# Patient Record
Sex: Female | Born: 1945
Health system: Southern US, Community
[De-identification: ages and names within clinical notes are randomized; demographics above are authoritative.]

## PROBLEM LIST (undated history)

## (undated) ENCOUNTER — Emergency Department (HOSPITAL_BASED_OUTPATIENT_CLINIC_OR_DEPARTMENT_OTHER): Admission: EM | Payer: Medicare Other

## (undated) DIAGNOSIS — I998 Other disorder of circulatory system: Secondary | ICD-10-CM

## (undated) DIAGNOSIS — I447 Left bundle-branch block, unspecified: Secondary | ICD-10-CM

## (undated) DIAGNOSIS — I428 Other cardiomyopathies: Secondary | ICD-10-CM

## (undated) DIAGNOSIS — Z8619 Personal history of other infectious and parasitic diseases: Secondary | ICD-10-CM

## (undated) DIAGNOSIS — I10 Essential (primary) hypertension: Secondary | ICD-10-CM

## (undated) DIAGNOSIS — K529 Noninfective gastroenteritis and colitis, unspecified: Secondary | ICD-10-CM

## (undated) DIAGNOSIS — G4733 Obstructive sleep apnea (adult) (pediatric): Secondary | ICD-10-CM

## (undated) DIAGNOSIS — K922 Gastrointestinal hemorrhage, unspecified: Secondary | ICD-10-CM

## (undated) DIAGNOSIS — H409 Unspecified glaucoma: Secondary | ICD-10-CM

## (undated) DIAGNOSIS — D259 Leiomyoma of uterus, unspecified: Secondary | ICD-10-CM

## (undated) DIAGNOSIS — M7989 Other specified soft tissue disorders: Secondary | ICD-10-CM

## (undated) DIAGNOSIS — E669 Obesity, unspecified: Secondary | ICD-10-CM

## (undated) DIAGNOSIS — T829XXA Unspecified complication of cardiac and vascular prosthetic device, implant and graft, initial encounter: Secondary | ICD-10-CM

## (undated) DIAGNOSIS — K5790 Diverticulosis of intestine, part unspecified, without perforation or abscess without bleeding: Secondary | ICD-10-CM

## (undated) DIAGNOSIS — K7469 Other cirrhosis of liver: Secondary | ICD-10-CM

## (undated) DIAGNOSIS — J810 Acute pulmonary edema: Secondary | ICD-10-CM

## (undated) DIAGNOSIS — I4891 Unspecified atrial fibrillation: Secondary | ICD-10-CM

## (undated) DIAGNOSIS — Z9289 Personal history of other medical treatment: Secondary | ICD-10-CM

## (undated) DIAGNOSIS — J449 Chronic obstructive pulmonary disease, unspecified: Secondary | ICD-10-CM

## (undated) DIAGNOSIS — E119 Type 2 diabetes mellitus without complications: Secondary | ICD-10-CM

## (undated) DIAGNOSIS — E039 Hypothyroidism, unspecified: Secondary | ICD-10-CM

## (undated) DIAGNOSIS — M109 Gout, unspecified: Secondary | ICD-10-CM

## (undated) DIAGNOSIS — K31819 Angiodysplasia of stomach and duodenum without bleeding: Secondary | ICD-10-CM

## (undated) DIAGNOSIS — M1990 Unspecified osteoarthritis, unspecified site: Secondary | ICD-10-CM

## (undated) DIAGNOSIS — N189 Chronic kidney disease, unspecified: Secondary | ICD-10-CM

## (undated) DIAGNOSIS — I5022 Chronic systolic (congestive) heart failure: Secondary | ICD-10-CM

## (undated) DIAGNOSIS — Z95810 Presence of automatic (implantable) cardiac defibrillator: Secondary | ICD-10-CM

## (undated) DIAGNOSIS — J45909 Unspecified asthma, uncomplicated: Secondary | ICD-10-CM

## (undated) DIAGNOSIS — M79602 Pain in left arm: Secondary | ICD-10-CM

## (undated) DIAGNOSIS — F329 Major depressive disorder, single episode, unspecified: Secondary | ICD-10-CM

## (undated) DIAGNOSIS — K648 Other hemorrhoids: Secondary | ICD-10-CM

## (undated) DIAGNOSIS — J42 Unspecified chronic bronchitis: Secondary | ICD-10-CM

## (undated) DIAGNOSIS — J189 Pneumonia, unspecified organism: Secondary | ICD-10-CM

## (undated) DIAGNOSIS — N179 Acute kidney failure, unspecified: Secondary | ICD-10-CM

## (undated) DIAGNOSIS — Z7901 Long term (current) use of anticoagulants: Secondary | ICD-10-CM

## (undated) DIAGNOSIS — M949 Disorder of cartilage, unspecified: Secondary | ICD-10-CM

## (undated) DIAGNOSIS — M899 Disorder of bone, unspecified: Secondary | ICD-10-CM

## (undated) DIAGNOSIS — B1920 Unspecified viral hepatitis C without hepatic coma: Secondary | ICD-10-CM

## (undated) DIAGNOSIS — E785 Hyperlipidemia, unspecified: Secondary | ICD-10-CM

## (undated) DIAGNOSIS — I8500 Esophageal varices without bleeding: Secondary | ICD-10-CM

## (undated) DIAGNOSIS — D649 Anemia, unspecified: Secondary | ICD-10-CM

## (undated) HISTORY — DX: Other hemorrhoids: K64.8

## (undated) HISTORY — DX: Personal history of other infectious and parasitic diseases: Z86.19

## (undated) HISTORY — DX: Leiomyoma of uterus, unspecified: D25.9

## (undated) HISTORY — DX: Unspecified osteoarthritis, unspecified site: M19.90

## (undated) HISTORY — DX: Diverticulosis of intestine, part unspecified, without perforation or abscess without bleeding: K57.90

## (undated) HISTORY — DX: Left bundle-branch block, unspecified: I44.7

## (undated) HISTORY — DX: Chronic systolic (congestive) heart failure: I50.22

## (undated) HISTORY — PX: CARDIAC CATHETERIZATION: SHX172

## (undated) HISTORY — DX: Unspecified asthma, uncomplicated: J45.909

## (undated) HISTORY — DX: Long term (current) use of anticoagulants: Z79.01

## (undated) HISTORY — DX: Other disorder of circulatory system: I99.8

## (undated) HISTORY — DX: Unspecified viral hepatitis C without hepatic coma: B19.20

## (undated) HISTORY — DX: Anemia, unspecified: D64.9

## (undated) HISTORY — DX: Unspecified viral hepatitis C without hepatic coma: K74.69

## (undated) HISTORY — DX: Essential (primary) hypertension: I10

## (undated) HISTORY — PX: TUBAL LIGATION: SHX77

## (undated) HISTORY — DX: Other cardiomyopathies: I42.8

## (undated) HISTORY — DX: Hypothyroidism, unspecified: E03.9

## (undated) HISTORY — DX: Unspecified glaucoma: H40.9

## (undated) HISTORY — PX: DILATION AND CURETTAGE OF UTERUS: SHX78

## (undated) HISTORY — DX: Hyperlipidemia, unspecified: E78.5

## (undated) HISTORY — PX: APPENDECTOMY: SHX54

## (undated) HISTORY — DX: Angiodysplasia of stomach and duodenum without bleeding: K31.819

## (undated) HISTORY — DX: Unspecified complication of cardiac and vascular prosthetic device, implant and graft, initial encounter: T82.9XXA

## (undated) HISTORY — DX: Disorder of bone, unspecified: M89.9

## (undated) HISTORY — DX: Type 2 diabetes mellitus without complications: E11.9

## (undated) HISTORY — DX: Disorder of cartilage, unspecified: M94.9

## (undated) HISTORY — PX: INSERT / REPLACE / REMOVE PACEMAKER: SUR710

## (undated) HISTORY — DX: Obstructive sleep apnea (adult) (pediatric): G47.33

## (undated) HISTORY — DX: Chronic obstructive pulmonary disease, unspecified: J44.9

## (undated) HISTORY — DX: Esophageal varices without bleeding: I85.00

## (undated) HISTORY — DX: Major depressive disorder, single episode, unspecified: F32.9

## (undated) HISTORY — DX: Obesity, unspecified: E66.9

---

## 2004-06-15 ENCOUNTER — Emergency Department (HOSPITAL_COMMUNITY): Admission: EM | Admit: 2004-06-15 | Discharge: 2004-06-15 | Payer: Self-pay | Admitting: Emergency Medicine

## 2004-08-04 ENCOUNTER — Inpatient Hospital Stay (HOSPITAL_COMMUNITY): Admission: EM | Admit: 2004-08-04 | Discharge: 2004-08-08 | Payer: Self-pay | Admitting: Emergency Medicine

## 2004-08-04 ENCOUNTER — Ambulatory Visit: Payer: Self-pay | Admitting: Internal Medicine

## 2004-08-05 ENCOUNTER — Encounter: Payer: Self-pay | Admitting: Cardiology

## 2004-08-09 ENCOUNTER — Ambulatory Visit: Payer: Self-pay

## 2004-08-25 ENCOUNTER — Ambulatory Visit: Payer: Self-pay | Admitting: Cardiology

## 2004-09-02 ENCOUNTER — Inpatient Hospital Stay (HOSPITAL_BASED_OUTPATIENT_CLINIC_OR_DEPARTMENT_OTHER): Admission: RE | Admit: 2004-09-02 | Discharge: 2004-09-02 | Payer: Self-pay | Admitting: Cardiology

## 2004-09-02 ENCOUNTER — Ambulatory Visit: Payer: Self-pay | Admitting: Cardiology

## 2004-09-08 ENCOUNTER — Ambulatory Visit: Payer: Self-pay | Admitting: Cardiology

## 2005-10-20 ENCOUNTER — Emergency Department (HOSPITAL_COMMUNITY): Admission: EM | Admit: 2005-10-20 | Discharge: 2005-10-21 | Payer: Self-pay | Admitting: Emergency Medicine

## 2005-10-21 ENCOUNTER — Emergency Department (HOSPITAL_COMMUNITY): Admission: EM | Admit: 2005-10-21 | Discharge: 2005-10-21 | Payer: Self-pay | Admitting: Emergency Medicine

## 2005-10-26 ENCOUNTER — Emergency Department (HOSPITAL_COMMUNITY): Admission: EM | Admit: 2005-10-26 | Discharge: 2005-10-26 | Payer: Self-pay | Admitting: Family Medicine

## 2005-10-29 ENCOUNTER — Emergency Department (HOSPITAL_COMMUNITY): Admission: EM | Admit: 2005-10-29 | Discharge: 2005-10-29 | Payer: Self-pay | Admitting: Emergency Medicine

## 2005-11-04 ENCOUNTER — Emergency Department (HOSPITAL_COMMUNITY): Admission: EM | Admit: 2005-11-04 | Discharge: 2005-11-04 | Payer: Self-pay | Admitting: Family Medicine

## 2005-11-06 ENCOUNTER — Ambulatory Visit: Payer: Self-pay | Admitting: Family Medicine

## 2005-11-07 ENCOUNTER — Ambulatory Visit: Payer: Self-pay | Admitting: *Deleted

## 2005-11-14 ENCOUNTER — Ambulatory Visit: Payer: Self-pay | Admitting: Family Medicine

## 2005-11-23 ENCOUNTER — Ambulatory Visit: Payer: Self-pay | Admitting: Family Medicine

## 2005-12-08 ENCOUNTER — Ambulatory Visit (HOSPITAL_COMMUNITY): Admission: RE | Admit: 2005-12-08 | Discharge: 2005-12-08 | Payer: Self-pay | Admitting: Family Medicine

## 2005-12-13 ENCOUNTER — Ambulatory Visit: Payer: Self-pay | Admitting: Family Medicine

## 2006-01-30 ENCOUNTER — Ambulatory Visit: Payer: Self-pay | Admitting: Family Medicine

## 2006-03-21 ENCOUNTER — Ambulatory Visit: Payer: Self-pay | Admitting: Family Medicine

## 2006-04-04 ENCOUNTER — Ambulatory Visit: Payer: Self-pay | Admitting: Family Medicine

## 2006-05-21 ENCOUNTER — Ambulatory Visit: Payer: Self-pay | Admitting: Internal Medicine

## 2006-05-28 ENCOUNTER — Ambulatory Visit: Payer: Self-pay | Admitting: Internal Medicine

## 2006-06-06 ENCOUNTER — Ambulatory Visit: Payer: Self-pay | Admitting: Internal Medicine

## 2006-06-10 ENCOUNTER — Ambulatory Visit: Payer: Self-pay | Admitting: Internal Medicine

## 2006-06-10 ENCOUNTER — Inpatient Hospital Stay (HOSPITAL_COMMUNITY): Admission: EM | Admit: 2006-06-10 | Discharge: 2006-06-19 | Payer: Self-pay | Admitting: Emergency Medicine

## 2006-06-12 ENCOUNTER — Encounter (INDEPENDENT_AMBULATORY_CARE_PROVIDER_SITE_OTHER): Payer: Self-pay | Admitting: Cardiology

## 2006-06-25 ENCOUNTER — Ambulatory Visit: Payer: Self-pay | Admitting: Internal Medicine

## 2006-07-11 ENCOUNTER — Ambulatory Visit: Payer: Self-pay | Admitting: Internal Medicine

## 2006-08-03 ENCOUNTER — Ambulatory Visit: Payer: Self-pay | Admitting: Cardiology

## 2006-08-10 ENCOUNTER — Ambulatory Visit: Payer: Self-pay | Admitting: Internal Medicine

## 2006-08-10 LAB — CONVERTED CEMR LAB
CO2: 31 meq/L (ref 19–32)
Calcium: 9.7 mg/dL (ref 8.4–10.5)
Creatinine, Ser: 0.9 mg/dL (ref 0.4–1.2)
GFR calc Af Amer: 83 mL/min
Glucose, Bld: 107 mg/dL — ABNORMAL HIGH (ref 70–99)

## 2006-08-20 ENCOUNTER — Ambulatory Visit: Payer: Self-pay | Admitting: Internal Medicine

## 2006-08-31 ENCOUNTER — Ambulatory Visit: Payer: Self-pay | Admitting: Cardiology

## 2006-08-31 LAB — CONVERTED CEMR LAB
BUN: 17 mg/dL (ref 6–23)
CO2: 33 meq/L — ABNORMAL HIGH (ref 19–32)
Calcium: 9.8 mg/dL (ref 8.4–10.5)
Creatinine, Ser: 1 mg/dL (ref 0.4–1.2)
GFR calc Af Amer: 73 mL/min
Glucose, Bld: 110 mg/dL — ABNORMAL HIGH (ref 70–99)

## 2006-10-08 ENCOUNTER — Ambulatory Visit: Payer: Self-pay | Admitting: Cardiology

## 2006-10-08 LAB — CONVERTED CEMR LAB
BUN: 26 mg/dL — ABNORMAL HIGH (ref 6–23)
CO2: 33 meq/L — ABNORMAL HIGH (ref 19–32)
Calcium: 9.6 mg/dL (ref 8.4–10.5)
Chloride: 106 meq/L (ref 96–112)
Creatinine, Ser: 1.1 mg/dL (ref 0.4–1.2)
GFR calc Af Amer: 66 mL/min
Glucose, Bld: 99 mg/dL (ref 70–99)

## 2006-10-24 ENCOUNTER — Encounter: Payer: Self-pay | Admitting: Cardiology

## 2006-10-24 ENCOUNTER — Ambulatory Visit: Payer: Self-pay

## 2006-10-30 ENCOUNTER — Ambulatory Visit: Payer: Self-pay | Admitting: Cardiology

## 2006-11-05 ENCOUNTER — Ambulatory Visit: Payer: Self-pay | Admitting: Internal Medicine

## 2006-11-14 ENCOUNTER — Ambulatory Visit (HOSPITAL_COMMUNITY): Admission: RE | Admit: 2006-11-14 | Discharge: 2006-11-14 | Payer: Self-pay | Admitting: Cardiology

## 2006-11-14 ENCOUNTER — Ambulatory Visit: Payer: Self-pay | Admitting: Internal Medicine

## 2006-12-04 ENCOUNTER — Ambulatory Visit: Payer: Self-pay | Admitting: Internal Medicine

## 2006-12-04 LAB — CONVERTED CEMR LAB
BUN: 21 mg/dL (ref 6–23)
CO2: 29 meq/L (ref 19–32)
Calcium: 9.8 mg/dL (ref 8.4–10.5)
Chloride: 99 meq/L (ref 96–112)
Creatinine, Ser: 1.1 mg/dL (ref 0.4–1.2)
GFR calc Af Amer: 66 mL/min
Glucose, Bld: 114 mg/dL — ABNORMAL HIGH (ref 70–99)

## 2006-12-20 ENCOUNTER — Ambulatory Visit: Payer: Self-pay

## 2006-12-20 ENCOUNTER — Encounter: Payer: Self-pay | Admitting: Internal Medicine

## 2006-12-20 ENCOUNTER — Ambulatory Visit: Payer: Self-pay | Admitting: Internal Medicine

## 2006-12-20 LAB — CONVERTED CEMR LAB
BUN: 25 mg/dL — ABNORMAL HIGH (ref 6–23)
Basophils Relative: 0.4 % (ref 0.0–1.0)
CO2: 30 meq/L (ref 19–32)
Creatinine, Ser: 0.9 mg/dL (ref 0.4–1.2)
Eosinophils Relative: 3.3 % (ref 0.0–5.0)
Glucose, Bld: 124 mg/dL — ABNORMAL HIGH (ref 70–99)
HCT: 42.5 % (ref 36.0–46.0)
Hemoglobin: 14.1 g/dL (ref 12.0–15.0)
INR: 1.1 (ref 0.9–2.0)
Lymphocytes Relative: 38 % (ref 12.0–46.0)
Monocytes Absolute: 0.6 10*3/uL (ref 0.2–0.7)
Monocytes Relative: 11.2 % — ABNORMAL HIGH (ref 3.0–11.0)
Neutro Abs: 2.5 10*3/uL (ref 1.4–7.7)
Potassium: 3.9 meq/L (ref 3.5–5.1)
Prothrombin Time: 13 s (ref 10.0–14.0)
RDW: 13.5 % (ref 11.5–14.6)
WBC: 5.3 10*3/uL (ref 4.5–10.5)

## 2006-12-24 ENCOUNTER — Ambulatory Visit: Payer: Self-pay | Admitting: Internal Medicine

## 2006-12-24 ENCOUNTER — Inpatient Hospital Stay (HOSPITAL_COMMUNITY): Admission: RE | Admit: 2006-12-24 | Discharge: 2006-12-26 | Payer: Self-pay | Admitting: Internal Medicine

## 2007-01-01 ENCOUNTER — Ambulatory Visit: Payer: Self-pay | Admitting: Internal Medicine

## 2007-01-04 DIAGNOSIS — B1920 Unspecified viral hepatitis C without hepatic coma: Secondary | ICD-10-CM | POA: Insufficient documentation

## 2007-01-04 HISTORY — DX: Unspecified viral hepatitis C without hepatic coma: B19.20

## 2007-01-05 ENCOUNTER — Inpatient Hospital Stay (HOSPITAL_COMMUNITY): Admission: EM | Admit: 2007-01-05 | Discharge: 2007-01-08 | Payer: Self-pay | Admitting: Emergency Medicine

## 2007-01-05 ENCOUNTER — Ambulatory Visit: Payer: Self-pay | Admitting: Internal Medicine

## 2007-01-08 DIAGNOSIS — Z95810 Presence of automatic (implantable) cardiac defibrillator: Secondary | ICD-10-CM

## 2007-01-08 HISTORY — DX: Presence of automatic (implantable) cardiac defibrillator: Z95.810

## 2007-01-10 ENCOUNTER — Ambulatory Visit: Payer: Self-pay | Admitting: Internal Medicine

## 2007-01-10 ENCOUNTER — Ambulatory Visit: Payer: Self-pay

## 2007-01-10 LAB — CONVERTED CEMR LAB
BUN: 25 mg/dL — ABNORMAL HIGH (ref 6–23)
CO2: 28 meq/L (ref 19–32)
Calcium: 9.6 mg/dL (ref 8.4–10.5)
Creatinine, Ser: 1.2 mg/dL (ref 0.4–1.2)
Glucose, Bld: 117 mg/dL — ABNORMAL HIGH (ref 70–99)
Potassium: 3.8 meq/L (ref 3.5–5.1)

## 2007-01-23 ENCOUNTER — Encounter (INDEPENDENT_AMBULATORY_CARE_PROVIDER_SITE_OTHER): Payer: Self-pay | Admitting: *Deleted

## 2007-01-24 ENCOUNTER — Ambulatory Visit: Payer: Self-pay | Admitting: Cardiology

## 2007-01-28 ENCOUNTER — Ambulatory Visit: Payer: Self-pay | Admitting: Internal Medicine

## 2007-01-28 DIAGNOSIS — E039 Hypothyroidism, unspecified: Secondary | ICD-10-CM | POA: Insufficient documentation

## 2007-02-13 ENCOUNTER — Ambulatory Visit: Payer: Self-pay | Admitting: Internal Medicine

## 2007-03-06 ENCOUNTER — Encounter (INDEPENDENT_AMBULATORY_CARE_PROVIDER_SITE_OTHER): Payer: Self-pay | Admitting: Nurse Practitioner

## 2007-03-06 ENCOUNTER — Ambulatory Visit: Payer: Self-pay | Admitting: Internal Medicine

## 2007-03-14 ENCOUNTER — Telehealth (INDEPENDENT_AMBULATORY_CARE_PROVIDER_SITE_OTHER): Payer: Self-pay | Admitting: *Deleted

## 2007-03-15 ENCOUNTER — Ambulatory Visit: Payer: Self-pay | Admitting: Nurse Practitioner

## 2007-04-02 ENCOUNTER — Ambulatory Visit: Payer: Self-pay | Admitting: Internal Medicine

## 2007-04-24 ENCOUNTER — Encounter (INDEPENDENT_AMBULATORY_CARE_PROVIDER_SITE_OTHER): Payer: Self-pay | Admitting: Nurse Practitioner

## 2007-04-24 DIAGNOSIS — I5032 Chronic diastolic (congestive) heart failure: Secondary | ICD-10-CM | POA: Insufficient documentation

## 2007-04-24 DIAGNOSIS — I10 Essential (primary) hypertension: Secondary | ICD-10-CM | POA: Insufficient documentation

## 2007-04-24 DIAGNOSIS — I447 Left bundle-branch block, unspecified: Secondary | ICD-10-CM | POA: Insufficient documentation

## 2007-04-24 DIAGNOSIS — J449 Chronic obstructive pulmonary disease, unspecified: Secondary | ICD-10-CM | POA: Insufficient documentation

## 2007-04-24 HISTORY — DX: Chronic diastolic (congestive) heart failure: I50.32

## 2007-04-24 HISTORY — DX: Essential (primary) hypertension: I10

## 2007-04-29 ENCOUNTER — Emergency Department (HOSPITAL_COMMUNITY): Admission: EM | Admit: 2007-04-29 | Discharge: 2007-04-29 | Payer: Self-pay | Admitting: Emergency Medicine

## 2007-05-13 ENCOUNTER — Ambulatory Visit: Payer: Self-pay | Admitting: Cardiology

## 2007-05-13 LAB — CONVERTED CEMR LAB
BUN: 13 mg/dL (ref 6–23)
CO2: 28 meq/L (ref 19–32)
Calcium: 9.9 mg/dL (ref 8.4–10.5)
Creatinine, Ser: 0.9 mg/dL (ref 0.4–1.2)
Glucose, Bld: 104 mg/dL — ABNORMAL HIGH (ref 70–99)
TSH: 2.72 microintl units/mL (ref 0.35–5.50)

## 2007-05-14 ENCOUNTER — Ambulatory Visit: Payer: Self-pay | Admitting: Internal Medicine

## 2007-06-12 ENCOUNTER — Ambulatory Visit: Payer: Self-pay | Admitting: Internal Medicine

## 2007-08-05 ENCOUNTER — Ambulatory Visit: Payer: Self-pay | Admitting: Cardiology

## 2007-08-13 ENCOUNTER — Ambulatory Visit: Payer: Self-pay | Admitting: Internal Medicine

## 2007-09-03 ENCOUNTER — Ambulatory Visit: Payer: Self-pay | Admitting: Cardiology

## 2007-09-23 ENCOUNTER — Encounter (INDEPENDENT_AMBULATORY_CARE_PROVIDER_SITE_OTHER): Payer: Self-pay | Admitting: Nurse Practitioner

## 2007-09-23 ENCOUNTER — Ambulatory Visit: Payer: Self-pay | Admitting: Cardiology

## 2007-10-09 ENCOUNTER — Ambulatory Visit (HOSPITAL_BASED_OUTPATIENT_CLINIC_OR_DEPARTMENT_OTHER): Admission: RE | Admit: 2007-10-09 | Discharge: 2007-10-09 | Payer: Self-pay | Admitting: Cardiology

## 2007-10-09 ENCOUNTER — Encounter: Payer: Self-pay | Admitting: Pulmonary Disease

## 2007-10-10 ENCOUNTER — Ambulatory Visit: Payer: Self-pay | Admitting: Cardiology

## 2007-10-10 LAB — CONVERTED CEMR LAB
CO2: 26 meq/L (ref 19–32)
Chloride: 99 meq/L (ref 96–112)
Potassium: 3.2 meq/L — ABNORMAL LOW (ref 3.5–5.1)
Sodium: 138 meq/L (ref 135–145)

## 2007-10-16 ENCOUNTER — Ambulatory Visit: Payer: Self-pay | Admitting: Cardiology

## 2007-10-16 LAB — CONVERTED CEMR LAB
CO2: 26 meq/L (ref 19–32)
Chloride: 105 meq/L (ref 96–112)
GFR calc Af Amer: 82 mL/min
Glucose, Bld: 142 mg/dL — ABNORMAL HIGH (ref 70–99)
Potassium: 3.7 meq/L (ref 3.5–5.1)
Sodium: 141 meq/L (ref 135–145)

## 2007-10-28 ENCOUNTER — Ambulatory Visit: Payer: Self-pay | Admitting: Pulmonary Disease

## 2007-11-05 ENCOUNTER — Ambulatory Visit: Payer: Self-pay | Admitting: Internal Medicine

## 2007-11-05 ENCOUNTER — Ambulatory Visit: Payer: Self-pay | Admitting: Cardiology

## 2007-11-05 LAB — CONVERTED CEMR LAB
BUN: 20 mg/dL (ref 6–23)
CO2: 28 meq/L (ref 19–32)
Chloride: 105 meq/L (ref 96–112)
GFR calc non Af Amer: 60 mL/min
Glucose, Bld: 141 mg/dL — ABNORMAL HIGH (ref 70–99)
Potassium: 3.9 meq/L (ref 3.5–5.1)

## 2007-11-14 ENCOUNTER — Ambulatory Visit: Payer: Self-pay | Admitting: Pulmonary Disease

## 2007-11-14 DIAGNOSIS — G4733 Obstructive sleep apnea (adult) (pediatric): Secondary | ICD-10-CM

## 2007-11-14 HISTORY — DX: Obstructive sleep apnea (adult) (pediatric): G47.33

## 2007-12-24 ENCOUNTER — Ambulatory Visit: Payer: Self-pay | Admitting: Internal Medicine

## 2008-01-07 ENCOUNTER — Ambulatory Visit: Payer: Self-pay | Admitting: Cardiology

## 2008-01-09 ENCOUNTER — Telehealth (INDEPENDENT_AMBULATORY_CARE_PROVIDER_SITE_OTHER): Payer: Self-pay | Admitting: Nurse Practitioner

## 2008-01-16 ENCOUNTER — Ambulatory Visit: Payer: Self-pay

## 2008-01-16 ENCOUNTER — Ambulatory Visit: Payer: Self-pay | Admitting: Internal Medicine

## 2008-01-16 ENCOUNTER — Encounter: Payer: Self-pay | Admitting: Internal Medicine

## 2008-01-16 LAB — CONVERTED CEMR LAB
BUN: 20 mg/dL (ref 6–23)
Chloride: 99 meq/L (ref 96–112)
GFR calc non Af Amer: 68 mL/min
Potassium: 3.4 meq/L — ABNORMAL LOW (ref 3.5–5.1)

## 2008-01-21 ENCOUNTER — Telehealth (INDEPENDENT_AMBULATORY_CARE_PROVIDER_SITE_OTHER): Payer: Self-pay | Admitting: Internal Medicine

## 2008-01-30 ENCOUNTER — Ambulatory Visit: Payer: Self-pay | Admitting: Nurse Practitioner

## 2008-01-30 ENCOUNTER — Ambulatory Visit: Payer: Self-pay | Admitting: Internal Medicine

## 2008-02-06 ENCOUNTER — Ambulatory Visit: Payer: Self-pay | Admitting: Cardiology

## 2008-02-10 ENCOUNTER — Encounter (INDEPENDENT_AMBULATORY_CARE_PROVIDER_SITE_OTHER): Payer: Self-pay | Admitting: Nurse Practitioner

## 2008-02-28 ENCOUNTER — Encounter (INDEPENDENT_AMBULATORY_CARE_PROVIDER_SITE_OTHER): Payer: Self-pay | Admitting: Nurse Practitioner

## 2008-02-28 ENCOUNTER — Ambulatory Visit: Payer: Self-pay | Admitting: Nurse Practitioner

## 2008-03-03 ENCOUNTER — Encounter (INDEPENDENT_AMBULATORY_CARE_PROVIDER_SITE_OTHER): Payer: Self-pay | Admitting: Nurse Practitioner

## 2008-03-03 ENCOUNTER — Telehealth (INDEPENDENT_AMBULATORY_CARE_PROVIDER_SITE_OTHER): Payer: Self-pay | Admitting: Nurse Practitioner

## 2008-03-04 ENCOUNTER — Ambulatory Visit (HOSPITAL_COMMUNITY): Admission: RE | Admit: 2008-03-04 | Discharge: 2008-03-04 | Payer: Self-pay | Admitting: Nurse Practitioner

## 2008-03-05 DIAGNOSIS — D259 Leiomyoma of uterus, unspecified: Secondary | ICD-10-CM | POA: Diagnosis present

## 2008-03-05 HISTORY — DX: Leiomyoma of uterus, unspecified: D25.9

## 2008-03-10 ENCOUNTER — Encounter (INDEPENDENT_AMBULATORY_CARE_PROVIDER_SITE_OTHER): Payer: Self-pay | Admitting: Nurse Practitioner

## 2008-03-10 ENCOUNTER — Ambulatory Visit (HOSPITAL_COMMUNITY): Admission: RE | Admit: 2008-03-10 | Discharge: 2008-03-10 | Payer: Self-pay | Admitting: Internal Medicine

## 2008-03-12 ENCOUNTER — Ambulatory Visit (HOSPITAL_COMMUNITY): Admission: RE | Admit: 2008-03-12 | Discharge: 2008-03-12 | Payer: Self-pay | Admitting: Internal Medicine

## 2008-03-19 ENCOUNTER — Ambulatory Visit: Payer: Self-pay | Admitting: Nurse Practitioner

## 2008-03-20 ENCOUNTER — Encounter (INDEPENDENT_AMBULATORY_CARE_PROVIDER_SITE_OTHER): Payer: Self-pay | Admitting: Nurse Practitioner

## 2008-03-20 LAB — CONVERTED CEMR LAB
ALT: 55 units/L — ABNORMAL HIGH (ref 0–35)
Alkaline Phosphatase: 70 units/L (ref 39–117)
Amylase: 93 units/L (ref 0–105)
Basophils Absolute: 0 10*3/uL (ref 0.0–0.1)
Eosinophils Absolute: 0.2 10*3/uL (ref 0.0–0.7)
Eosinophils Relative: 2 % (ref 0–5)
HCT: 43.3 % (ref 36.0–46.0)
HCV Ab: REACTIVE — AB
LDL Cholesterol: 67 mg/dL (ref 0–99)
Lipase: 41 units/L (ref 0–75)
MCV: 88.5 fL (ref 78.0–100.0)
Platelets: 225 10*3/uL (ref 150–400)
RDW: 15.7 % — ABNORMAL HIGH (ref 11.5–15.5)
Sodium: 137 meq/L (ref 135–145)
Total Bilirubin: 0.4 mg/dL (ref 0.3–1.2)
Total Protein: 8.4 g/dL — ABNORMAL HIGH (ref 6.0–8.3)
Triglycerides: 150 mg/dL — ABNORMAL HIGH (ref <150)
VLDL: 30 mg/dL (ref 0–40)

## 2008-03-25 ENCOUNTER — Telehealth (INDEPENDENT_AMBULATORY_CARE_PROVIDER_SITE_OTHER): Payer: Self-pay | Admitting: Nurse Practitioner

## 2008-04-16 ENCOUNTER — Ambulatory Visit: Payer: Self-pay | Admitting: Internal Medicine

## 2008-04-17 ENCOUNTER — Ambulatory Visit: Payer: Self-pay | Admitting: Cardiology

## 2008-04-23 ENCOUNTER — Ambulatory Visit: Payer: Self-pay | Admitting: Nurse Practitioner

## 2008-04-24 ENCOUNTER — Encounter (INDEPENDENT_AMBULATORY_CARE_PROVIDER_SITE_OTHER): Payer: Self-pay | Admitting: Nurse Practitioner

## 2008-05-04 LAB — CONVERTED CEMR LAB
HCV Ab: REACTIVE — AB
HCV Quantitative: 3120000 intl units/mL — ABNORMAL HIGH (ref <43)
Hep B Core Total Ab: POSITIVE — AB
Hep B S Ab: NEGATIVE
Hepatitis B Surface Ag: NEGATIVE

## 2008-05-15 ENCOUNTER — Ambulatory Visit: Payer: Self-pay | Admitting: Nurse Practitioner

## 2008-05-15 LAB — CONVERTED CEMR LAB: INR: 1 (ref 0.0–1.5)

## 2008-05-18 ENCOUNTER — Encounter (INDEPENDENT_AMBULATORY_CARE_PROVIDER_SITE_OTHER): Payer: Self-pay | Admitting: Nurse Practitioner

## 2008-05-19 ENCOUNTER — Encounter (INDEPENDENT_AMBULATORY_CARE_PROVIDER_SITE_OTHER): Payer: Self-pay | Admitting: Nurse Practitioner

## 2008-05-19 ENCOUNTER — Ambulatory Visit (HOSPITAL_COMMUNITY): Admission: RE | Admit: 2008-05-19 | Discharge: 2008-05-19 | Payer: Self-pay | Admitting: Internal Medicine

## 2008-05-20 ENCOUNTER — Encounter (INDEPENDENT_AMBULATORY_CARE_PROVIDER_SITE_OTHER): Payer: Self-pay | Admitting: Nurse Practitioner

## 2008-06-05 ENCOUNTER — Encounter (INDEPENDENT_AMBULATORY_CARE_PROVIDER_SITE_OTHER): Payer: Self-pay | Admitting: Nurse Practitioner

## 2008-06-17 ENCOUNTER — Ambulatory Visit: Payer: Self-pay | Admitting: Family Medicine

## 2008-06-17 ENCOUNTER — Ambulatory Visit (HOSPITAL_COMMUNITY): Admission: RE | Admit: 2008-06-17 | Discharge: 2008-06-17 | Payer: Self-pay | Admitting: Family Medicine

## 2008-06-17 ENCOUNTER — Telehealth (INDEPENDENT_AMBULATORY_CARE_PROVIDER_SITE_OTHER): Payer: Self-pay | Admitting: Nurse Practitioner

## 2008-06-17 ENCOUNTER — Encounter (INDEPENDENT_AMBULATORY_CARE_PROVIDER_SITE_OTHER): Payer: Self-pay | Admitting: Nurse Practitioner

## 2008-06-18 ENCOUNTER — Ambulatory Visit: Payer: Self-pay | Admitting: Nurse Practitioner

## 2008-06-18 LAB — CONVERTED CEMR LAB
Eosinophils Absolute: 0.1 10*3/uL (ref 0.0–0.7)
Eosinophils Relative: 1 % (ref 0–5)
HCT: 45.4 % (ref 36.0–46.0)
Lymphs Abs: 2.1 10*3/uL (ref 0.7–4.0)
MCHC: 33.5 g/dL (ref 30.0–36.0)
MCV: 87.8 fL (ref 78.0–100.0)
Monocytes Relative: 12 % (ref 3–12)
RBC: 5.17 M/uL — ABNORMAL HIGH (ref 3.87–5.11)
WBC: 10.4 10*3/uL (ref 4.0–10.5)

## 2008-06-25 ENCOUNTER — Ambulatory Visit: Payer: Self-pay | Admitting: Internal Medicine

## 2008-06-30 ENCOUNTER — Telehealth (INDEPENDENT_AMBULATORY_CARE_PROVIDER_SITE_OTHER): Payer: Self-pay | Admitting: Nurse Practitioner

## 2008-07-01 ENCOUNTER — Encounter: Payer: Self-pay | Admitting: Internal Medicine

## 2008-08-14 ENCOUNTER — Ambulatory Visit: Payer: Self-pay | Admitting: Nurse Practitioner

## 2008-09-17 ENCOUNTER — Ambulatory Visit: Payer: Self-pay | Admitting: Nurse Practitioner

## 2008-09-18 LAB — CONVERTED CEMR LAB
Basophils Absolute: 0 10*3/uL (ref 0.0–0.1)
Basophils Relative: 0 % (ref 0–1)
Eosinophils Absolute: 0.2 10*3/uL (ref 0.0–0.7)
Eosinophils Relative: 2 % (ref 0–5)
HCT: 43.4 % (ref 36.0–46.0)
MCHC: 33.2 g/dL (ref 30.0–36.0)
MCV: 82.5 fL (ref 78.0–100.0)
Platelets: 288 10*3/uL (ref 150–400)
RDW: 15.3 % (ref 11.5–15.5)

## 2008-09-22 ENCOUNTER — Ambulatory Visit: Payer: Self-pay | Admitting: Internal Medicine

## 2008-09-30 ENCOUNTER — Encounter (INDEPENDENT_AMBULATORY_CARE_PROVIDER_SITE_OTHER): Payer: Self-pay | Admitting: Nurse Practitioner

## 2008-09-30 DIAGNOSIS — M899 Disorder of bone, unspecified: Secondary | ICD-10-CM | POA: Insufficient documentation

## 2008-09-30 DIAGNOSIS — M949 Disorder of cartilage, unspecified: Secondary | ICD-10-CM

## 2008-09-30 HISTORY — DX: Disorder of bone, unspecified: M89.9

## 2008-10-23 ENCOUNTER — Ambulatory Visit: Payer: Self-pay | Admitting: Internal Medicine

## 2008-12-02 ENCOUNTER — Telehealth (INDEPENDENT_AMBULATORY_CARE_PROVIDER_SITE_OTHER): Payer: Self-pay | Admitting: Nurse Practitioner

## 2008-12-08 ENCOUNTER — Ambulatory Visit: Payer: Self-pay | Admitting: Nurse Practitioner

## 2008-12-09 ENCOUNTER — Encounter (INDEPENDENT_AMBULATORY_CARE_PROVIDER_SITE_OTHER): Payer: Self-pay | Admitting: Nurse Practitioner

## 2009-01-05 ENCOUNTER — Ambulatory Visit: Payer: Self-pay | Admitting: Internal Medicine

## 2009-03-22 ENCOUNTER — Ambulatory Visit: Payer: Self-pay | Admitting: Physician Assistant

## 2009-05-27 ENCOUNTER — Ambulatory Visit: Payer: Self-pay | Admitting: Nurse Practitioner

## 2009-05-27 DIAGNOSIS — E669 Obesity, unspecified: Secondary | ICD-10-CM

## 2009-05-27 HISTORY — DX: Obesity, unspecified: E66.9

## 2009-05-31 LAB — CONVERTED CEMR LAB
ALT: 85 units/L — ABNORMAL HIGH (ref 0–35)
Basophils Relative: 0 % (ref 0–1)
CO2: 25 meq/L (ref 19–32)
Creatinine, Ser: 0.79 mg/dL (ref 0.40–1.20)
Eosinophils Absolute: 0.2 10*3/uL (ref 0.0–0.7)
Glucose, Bld: 93 mg/dL (ref 70–99)
MCHC: 33.6 g/dL (ref 30.0–36.0)
MCV: 87.9 fL (ref 78.0–100.0)
Monocytes Relative: 10 % (ref 3–12)
Neutrophils Relative %: 39 % — ABNORMAL LOW (ref 43–77)
RBC: 4.97 M/uL (ref 3.87–5.11)
TSH: 0.257 microintl units/mL — ABNORMAL LOW (ref 0.350–4.500)
Total Bilirubin: 0.5 mg/dL (ref 0.3–1.2)
Vit D, 25-Hydroxy: 12 ng/mL — ABNORMAL LOW (ref 30–89)

## 2009-06-02 ENCOUNTER — Encounter (INDEPENDENT_AMBULATORY_CARE_PROVIDER_SITE_OTHER): Payer: Self-pay | Admitting: *Deleted

## 2009-06-03 ENCOUNTER — Encounter (INDEPENDENT_AMBULATORY_CARE_PROVIDER_SITE_OTHER): Payer: Self-pay | Admitting: *Deleted

## 2009-06-03 ENCOUNTER — Ambulatory Visit (HOSPITAL_COMMUNITY): Admission: RE | Admit: 2009-06-03 | Discharge: 2009-06-03 | Payer: Self-pay | Admitting: Internal Medicine

## 2009-06-22 ENCOUNTER — Encounter (INDEPENDENT_AMBULATORY_CARE_PROVIDER_SITE_OTHER): Payer: Self-pay

## 2009-06-25 ENCOUNTER — Ambulatory Visit: Payer: Self-pay | Admitting: Internal Medicine

## 2009-07-06 ENCOUNTER — Ambulatory Visit: Payer: Self-pay | Admitting: Internal Medicine

## 2009-07-09 ENCOUNTER — Ambulatory Visit: Payer: Self-pay | Admitting: Internal Medicine

## 2009-07-15 ENCOUNTER — Encounter: Payer: Self-pay | Admitting: Internal Medicine

## 2009-08-18 ENCOUNTER — Ambulatory Visit: Payer: Self-pay | Admitting: Nurse Practitioner

## 2009-08-19 LAB — CONVERTED CEMR LAB
Free T4: 1.42 ng/dL (ref 0.80–1.80)
T3, Free: 3.1 pg/mL (ref 2.3–4.2)
TSH: 0.253 microintl units/mL — ABNORMAL LOW (ref 0.350–4.500)

## 2009-10-06 ENCOUNTER — Ambulatory Visit: Payer: Self-pay

## 2009-10-06 ENCOUNTER — Encounter: Payer: Self-pay | Admitting: Internal Medicine

## 2009-11-03 ENCOUNTER — Encounter (INDEPENDENT_AMBULATORY_CARE_PROVIDER_SITE_OTHER): Payer: Self-pay | Admitting: Nurse Practitioner

## 2009-11-03 ENCOUNTER — Ambulatory Visit: Payer: Self-pay | Admitting: Physician Assistant

## 2009-11-04 ENCOUNTER — Encounter: Payer: Self-pay | Admitting: Physician Assistant

## 2009-11-09 ENCOUNTER — Telehealth (INDEPENDENT_AMBULATORY_CARE_PROVIDER_SITE_OTHER): Payer: Self-pay | Admitting: Nurse Practitioner

## 2009-11-09 ENCOUNTER — Ambulatory Visit: Payer: Self-pay | Admitting: Nurse Practitioner

## 2009-11-10 ENCOUNTER — Encounter: Admission: RE | Admit: 2009-11-10 | Discharge: 2010-02-08 | Payer: Self-pay | Admitting: Internal Medicine

## 2009-11-10 ENCOUNTER — Encounter (INDEPENDENT_AMBULATORY_CARE_PROVIDER_SITE_OTHER): Payer: Self-pay | Admitting: Nurse Practitioner

## 2009-11-15 ENCOUNTER — Encounter (INDEPENDENT_AMBULATORY_CARE_PROVIDER_SITE_OTHER): Payer: Self-pay | Admitting: Nurse Practitioner

## 2009-12-17 ENCOUNTER — Ambulatory Visit: Payer: Self-pay | Admitting: Nurse Practitioner

## 2009-12-17 DIAGNOSIS — F329 Major depressive disorder, single episode, unspecified: Secondary | ICD-10-CM

## 2009-12-17 DIAGNOSIS — E119 Type 2 diabetes mellitus without complications: Secondary | ICD-10-CM | POA: Insufficient documentation

## 2009-12-17 DIAGNOSIS — F32A Depression, unspecified: Secondary | ICD-10-CM | POA: Insufficient documentation

## 2009-12-17 DIAGNOSIS — F3289 Other specified depressive episodes: Secondary | ICD-10-CM

## 2009-12-17 HISTORY — DX: Other specified depressive episodes: F32.89

## 2009-12-17 HISTORY — DX: Major depressive disorder, single episode, unspecified: F32.9

## 2010-01-06 ENCOUNTER — Encounter: Payer: Self-pay | Admitting: Internal Medicine

## 2010-01-06 ENCOUNTER — Ambulatory Visit: Payer: Self-pay

## 2010-01-07 ENCOUNTER — Encounter (INDEPENDENT_AMBULATORY_CARE_PROVIDER_SITE_OTHER): Payer: Self-pay | Admitting: Nurse Practitioner

## 2010-01-07 ENCOUNTER — Encounter: Payer: Self-pay | Admitting: Nurse Practitioner

## 2010-01-07 ENCOUNTER — Ambulatory Visit: Payer: Self-pay | Admitting: Nurse Practitioner

## 2010-01-20 ENCOUNTER — Telehealth (INDEPENDENT_AMBULATORY_CARE_PROVIDER_SITE_OTHER): Payer: Self-pay | Admitting: Nurse Practitioner

## 2010-03-14 ENCOUNTER — Telehealth (INDEPENDENT_AMBULATORY_CARE_PROVIDER_SITE_OTHER): Payer: Self-pay | Admitting: Nurse Practitioner

## 2010-04-07 ENCOUNTER — Ambulatory Visit: Payer: Self-pay | Admitting: Nurse Practitioner

## 2010-04-08 ENCOUNTER — Encounter: Payer: Self-pay | Admitting: Internal Medicine

## 2010-04-14 ENCOUNTER — Telehealth (INDEPENDENT_AMBULATORY_CARE_PROVIDER_SITE_OTHER): Payer: Self-pay | Admitting: Nurse Practitioner

## 2010-04-14 ENCOUNTER — Ambulatory Visit: Payer: Self-pay | Admitting: Nurse Practitioner

## 2010-05-06 ENCOUNTER — Encounter (INDEPENDENT_AMBULATORY_CARE_PROVIDER_SITE_OTHER): Payer: Self-pay | Admitting: *Deleted

## 2010-05-16 ENCOUNTER — Encounter: Payer: Self-pay | Admitting: Internal Medicine

## 2010-05-29 ENCOUNTER — Encounter: Payer: Self-pay | Admitting: Internal Medicine

## 2010-06-05 LAB — CONVERTED CEMR LAB
Blood in Urine, dipstick: NEGATIVE
GC Probe Amp, Genital: NEGATIVE
KOH Prep: NEGATIVE
Specific Gravity, Urine: 1.015
TSH: 18.662 microintl units/mL — ABNORMAL HIGH (ref 0.350–4.50)
pH: 5.5

## 2010-06-07 NOTE — Letter (Signed)
Summary: Patient Notice-Hyperplastic Polyps  [facility]  [place], [place] [address]   Phone: [phone]  Fax: [phone]        July 15, 2009 MRN: [id number]    [place], [place]  [address]    Dear Ms. Rutherford Nail,  I am pleased to inform you that the colon polyp removed during your recent colonoscopy was found to be hyperplastic.  These types of polyps are NOT pre-cancerous.  It is therefore my recommendation that you have a repeat colonoscopy examination in 10 years for routine colorectal cancer screening.  Should you develop new or worsening symptoms of abdominal pain, bowel habit changes or bleeding from the rectum or bowels, please schedule an evaluation with either your primary care physician or with me. If a close relative develops colon cancer before the age of 57, that could mean you need a colonoscopy sooner also and you should let me know.  In addition to repeating colonoscopy, changing health habits may reduce your risk of having olon polyps and possibly, colon cancer. You may lower your risk of future polyps and colon cancer by adopting healthy habits such as not smoking or using tobacco (if you do), being physically active, losing weight (if overweight), and eating a diet which includes fruits and vegetables and limits red meat.  Please call us if you are having persistent problems or have questions about your condition that have not been fully answered at this time.   Sincerely,  [name] MD, [facility] This letter has been electronically signed by your physician.  Appended Document: Patient Notice-Hyperplastic Polyps Letter mailed 3.10.11

## 2010-06-07 NOTE — Letter (Signed)
Summary: Device-Delinquent Phone Proofreader, [place]  [address] [place]   [place], [place] [address]   Phone: [phone]  Fax: [phone]     April 08, 2010 MRN: [id number]   [place], [place]  [address]   Dear Morgan Clay,  According to our records, you were scheduled for a device phone transmission on 04-07-2010.     We did not receive any results from this check.  If you transmitted on your scheduled day, please call us to help troubleshoot your system.  If you forgot to send your transmission, please send one upon receipt of this letter.  Thank you,   [facility]

## 2010-06-07 NOTE — Assessment & Plan Note (Signed)
Summary: Depression   Vital Signs:  Patient profile:   65 year old female Menstrual status:  postmenopausal Weight:      165.9 pounds BMI:     34.21 Temp:     97.6 degrees F oral Pulse rate:   80 / minute Pulse rhythm:   regular Resp:     20 per minute BP sitting:   151 / 80  (left arm) Cuff size:   regular  Vitals Entered By: Isla Pence (December 17, 2009 2:43 PM)  Nutrition Counseling: Patient's BMI is greater than 25 and therefore counseled on weight management options. CC: dizziness, depression, not sleeping pt states she is just in a stressful time in her life right now, Depression Is Patient Diabetic? No Pain Assessment Patient in pain? no       Does patient need assistance? Functional Status Self care Ambulation Normal   Primary Care Provider:  Aurora Mask FNP  CC:  dizziness, depression, not sleeping pt states she is just in a stressful time in her life right now, and Depression.  History of Present Illness:  Pt into the office with complaints of dizziness. Pt has started physical therapy - she only went a couple of times and was given exercises at home but she did not return to therapy because she did not feel like it was helping. She went to therapy for 3 weeks Dizziness has increased - it used to be only with movement but now even when she lays down she feels like the bed is moving.   Pt was dx in June with vertigo.  Depression History:      The patient presents with symptoms of depression which have been present for greater than two weeks.  The patient is having a depressed mood most of the day and has a diminished interest in her usual daily activities.  Positive alarm features for depression include insomnia and fatigue (loss of energy).  However, she denies recurrent thoughts of death or suicide.        Psychosocial stress factors include the recent death of a loved one.  The patient denies that she feels like life is not worth living, denies that  she wishes that she were dead, and denies that she has thought about ending her life.        Comments:  Grandson died on 12/16/2009 than aunt died then another member of the family.  Depression Treatment History:  Prior Medication Used:   Start Date: Assessment of Effect:   Comments:  Zoloft (sertraline)     12/17/2009     --       re-started   Habits & Providers  Alcohol-Tobacco-Diet     Alcohol type: wine     Tobacco Status: never  Exercise-Depression-Behavior     Does Patient Exercise: yes     Exercise Counseling: to improve exercise regimen     Type of exercise: water aerobic     Have you felt down or hopeless? yes     Have you felt little pleasure in things? yes     Depression Counseling: not indicated; screening negative for depression     Drug Use: no     Seat Belt Use: 100     Sun Exposure: occasionally  Comments: PHQ-9 score = 14  Allergies (verified): 1)  ! Percocet  Review of Systems General:  +dizziness. CV:  Denies chest pain or discomfort. Resp:  Denies cough. GI:  Denies abdominal pain, nausea, and vomiting.  Physical Exam  General:  alert.   Head:  normocephalic.   Ears:  bil TM with bony landmarks visible Lungs:  normal breath sounds.   Heart:  normal rate and regular rhythm.   Neurologic:  alert & oriented X3.     Impression & Recommendations:  Problem # 1:  DEPRESSION (ICD-311) PHQ-9 score = 14 Her updated medication list for this problem includes:    Trazodone Hcl 50 Mg Tabs (Trazodone hcl) .[name]... 1 tab by mouth q hs    Sertraline Hcl 100 Mg Tabs (Sertraline hcl) ..... One tablet by mouth daily nightly  Problem # 2:  DIZZINESS (ICD-780.4) ? is secondary to abrupt stop of zoloft Her updated medication list for this problem includes:    Loratadine 10 Mg Tabs (Loratadine) ..... One tablet by mouth daily for allergies    Meclizine Hcl 25 Mg Tabs (Meclizine hcl) .[name]... Take 1 by mouth every 12 hours as needed for significant  dizziness  Complete Medication List: 1)  Lisinopril 20 Mg Tabs (Lisinopril) .... Take 2 tablets by mouth daily for blood pressure 2)  Coreg 25 Mg Tabs (Carvedilol) .[name].. 1 tablet by mouth two times a day 3)  Levothyroxine Sodium 112 Mcg Tabs (Levothyroxine sodium) .... One tablet by mouth daily for thyroid **note change in dose** 4)  Ventolin Hfa 108 (90 Base) Mcg/act Aers (Albuterol sulfate) .... Inhale when needed 5)  Aspirin 81 Mg Tabs (Aspirin) .... Take 1 tablet by mouth once a day 6)  Potassium Chloride 20 Meq Pack (Potassium chloride) .... Hold 7)  Furosemide 40 Mg Tabs (Furosemide) .[name].. 1 tab by mouth daily 8)  Trazodone Hcl 50 Mg Tabs (Trazodone hcl) .[name].. 1 tab by mouth q hs 9)  Naprosyn 500 Mg Tabs (Naproxen) .[name].. 1 tablet by mouth two times a day as needed for pain 10)  Ultram 50 Mg Tabs (Tramadol hcl) .[name].. 1 tablet by mouth two times a day as needed for pain 11)  Triamcinolone Acetonide 0.1 % Crea (Triamcinolone acetonide) .[name].. 1 application topically to affected area 12)  Fluticasone Propionate 50 Mcg/act Susp (Fluticasone propionate) .... One spray in each nostril two times a day (hold head down) 13)  Proventil Hfa 108 (90 Base) Mcg/act Aers (Albuterol sulfate) .... 2 puffs every 6 hours as needed for shortness of breath 14)  Hydralazine Hcl 25 Mg Tabs (Hydralazine hcl) .... Take one tablet by mouth twice day 15)  Voltaren 1 % Gel (Diclofenac sodium) .... One application topically to bilateral knees three times a day as needed 16)  Vitamin D (ergocalciferol) 50000 Unit Caps (Ergocalciferol) .... One capsule by mouth weekly 17)  Loratadine 10 Mg Tabs (Loratadine) .... One tablet by mouth daily for allergies 18)  Meclizine Hcl 25 Mg Tabs (Meclizine hcl) .... Take 1 by mouth every 12 hours as needed for significant dizziness 19)  Sertraline Hcl 100 Mg Tabs (Sertraline hcl) .... One tablet by mouth daily nightly  Patient Instructions: 1)  Restart medications for mood 2)  Start back  sertraline at 1/2 tablet by mouth daily for 1 week then increase to 1 tablet by mouth daily 3)  Dizziness - continue to do your exercises at home 4)  Follow up in 4 weeks for mood and dizziness Prescriptions: SERTRALINE HCL 100 MG TABS (SERTRALINE HCL) One tablet by mouth daily nightly  #30 x 5   Entered and Authorized by:   Aurora Mask FNP   Signed by:   Aurora Mask FNP on 12/17/2009   Method used:  Print then Give to Patient   RxID:   [id number]

## 2010-06-07 NOTE — Progress Notes (Signed)
Summary: Wants neurology referral  Phone Note Outgoing Call   Summary of Call: Pt. wants neurology referral set up. Initial call taken by: Sherian Maroon RN,  April 14, 2010 2:41 PM  Follow-up for Phone Call        refer to pt [facility] Follow-up by: Aurora Mask FNP,  April 14, 2010 7:37 PM  Additional Follow-up for Phone Call Additional follow up Details #1::        I SEND A REFERRAL TO [facility] Additional Follow-up by: Maren Reamer,  April 15, 2010 7:42 PM

## 2010-06-07 NOTE — Assessment & Plan Note (Signed)
Summary: medtronic/saf      Allergies Added:   Referring Provider:  [facility] Primary Provider:  [facility]  CC:  device check.  History of Present Illness: Mrs. Ohlmann is seen in followup for congestive heart failure in the setting of nonischemic cardiomyopathy. She is status post CRT-D implantation. She is doing well without complaints of chest pain or shortness of breath or peripheral edema. There have been no intercurrent discharges.  her last echo from September 2009 demonstrated normalization of LV function  Current Medications (verified): 1)  Lisinopril 20 Mg Tabs (Lisinopril) .... Take 2 Tablets By Mouth Daily For Blood Pressure **pharmacy - D/c Lisinopril 40mg  Tablet** 2)  Coreg 25 Mg  Tabs (Carvedilol) .[name].. 1 Tablet By Mouth Two Times A Day 3)  Levothyroxine Sodium 125 Mcg Tabs (Levothyroxine Sodium) .... One Tablet By Mouth Daily For Thyroid **note Change in Dose** 4)  Ventolin Hfa 108 (90 Base) Mcg/act  Aers (Albuterol Sulfate) .... Inhale When Needed 5)  Aspirin 81 Mg  Tabs (Aspirin) .... Take 1 Tablet By Mouth Once A Day 6)  Potassium Chloride 20 Meq  Pack (Potassium Chloride) .... Hold 7)  Furosemide 40 Mg  Tabs (Furosemide) .[name].. 1 Tab By Mouth Daily 8)  Trazodone Hcl 50 Mg  Tabs (Trazodone Hcl) .[name].. 1 Tab By Mouth Q Hs 9)  Naprosyn 500 Mg Tabs (Naproxen) .[name].. 1 Tablet By Mouth Two Times A Day As Needed For Pain 10)  Ultram 50 Mg Tabs (Tramadol Hcl) .[name].. 1 Tablet By Mouth Two Times A Day As Needed For Pain 11)  Triamcinolone Acetonide 0.1 % Crea (Triamcinolone Acetonide) .[name].. 1 Application Topically To Affected Area 12)  Nasacort Aq 55 Mcg/act Aers (Triamcinolone Acetonide(Nasal)) .[name].. 1 Spray in Each Nostril 13)  Proventil Hfa 108 (90 Base) Mcg/act Aers (Albuterol Sulfate) .... 2 Puffs Every 6 Hours As Needed For Shortness of Breath 14)  Hydralazine Hcl 25 Mg Tabs (Hydralazine Hcl) .... Take One Tablet By Mouth Twice Day 15)  Voltaren 1 % Gel (Diclofenac Sodium) ....  One Application Topically To Bilateral Knees Three Times A Day As Needed 16)  Vitamin D (Ergocalciferol) 50000 Unit Caps (Ergocalciferol) .... One Capsule By Mouth Weekly 17)  Moviprep 100 Gm  Solr (Peg-Kcl-Nacl-Nasulf-Na Asc-C) .... As Per Prep Instructions.  Allergies (verified): 1)  ! Percocet  Past History:  Past Medical History: Last updated: 09/16/2008 Current Problems:  BUNDLE BRANCH BLOCK, LEFT (ICD-426.3) COPD (ICD-496) HYPERTENSION, BENIGN (ICD-401.1) IMPLANTATION OF DEFIBRILLATOR, HX OF (0000000) implanted Concerto ICD, model   [id number], implanted on December 24, 2006 for nonischemic cardiomyopathy ANXIETY (ICD-300.00) OTHER ACUTE SINUSITIS (ICD-461.8) HYPOTHYROIDISM NOS (ICD-244.9) CONGESTIVE HEART FAILURE (ICD-428.0) HYPERTHYROIDISM (ICD-242.90) HEPATITIS C (ICD-070.51) HEPATITIS B, HX OF (ICD-V12.09)    Past Surgical History: Last updated: 01/04/2007 Appendectomy Cardiac catheterization   Family History: Last updated: 11/14/2007 asthma: father,sister cancer: sister (lung)   Social History: Last updated: 11/14/2007 Patient never smoked.  pt is single. pt has children.   Vital Signs:  Patient profile:   65 year old female Menstrual status:  postmenopausal Height:      58.5 inches Weight:      169 pounds Pulse rate:   67 / minute Pulse rhythm:   regular BP sitting:   148 / 70  (left arm) Cuff size:   large  Vitals Entered By: Doug Sou CMA (July 06, 2009 3:30 PM)  Physical Exam  General:  The patient was alert and oriented in no acute distress. HEENT Normal.  Neck veins were flat,  carotids were brisk.  Lungs were clear.  Heart sounds were regular without murmurs or gallops.  Abdomen was soft with active bowel sounds. There is no clubbing cyanosis or edema. Skin Warm and dry     ICD Specifications Following MD:  Virl Axe, MD     ICD Vendor:  Medtronic     ICD Model Number:  [id number]     ICD Serial Number:  [id number] ICD DOI:   12/24/2006     ICD Implanting MD:  Virl Axe, MD Research Study Name OPTIVOL  Lead 1:    Location: RA     DOI: 12/24/2006     Model #: [id number]     Serial #: [id number]     Status: active Lead 2:    Location: RV     DOI: 12/24/2006     Model #: [id number]     Serial #: [id number]     Status: active Lead 3:    Location: LV     DOI: 12/24/2006     Model #: [id number]     Serial #[id number]     Status: active  Indications::  NICM, CHF   ICD Follow Up Remote Check?  No Battery Voltage:  2.96 V     Charge Time:  11.1 seconds     ICD Dependent:  No       ICD Device Measurements Atrium:  Amplitude: 3.6 mV, Impedance: 560 ohms, Threshold: 0.5 V at 0.4 msec Right Ventricle:  Amplitude: 16 mV, Impedance: 472 ohms, Threshold: 0.75 V at 0.4 msec Left Ventricle:  Impedance: 448 ohms, Threshold: 1.0 V at 0.4 msec Shock Impedance: 44/52 ohms   Episodes Percent Mode Switch:  <0.1%     Shock:  0     ATP:  0     Nonsustained:  1     Atrial Pacing:  0.2%     Ventricular Pacing:  99.7%  Brady Parameters Mode DDD     Lower Rate Limit:  50     Upper Rate Limit 130 PAV 180     Sensed AV Delay:  160  Tachy Zones VF:  214     VT:  182     Next Cardiology Appt Due:  07/07/2010 Tech Comments:  No parameter changes.  Device function normal.Optivol and thoracic impedance abnnormal but reset just recently.  She was being treated by her PCP for thyroid issues. Checked by [employer].  No [facility] @ this time.  ROV 3 months clinic. Alma Friendly, LPN  March  1, 624THL QA348G PM   Impression & Recommendations:  Problem # 1:  CARDIOMYOPATHY-RESOLVED (ICD-425.4) thankfully herr myopathy has resolved. We will however continue her on her current medications. Her updated medication list for this problem includes:    Lisinopril 20 Mg Tabs (Lisinopril) .[name]... Take 2 tablets by mouth daily for blood pressure **pharmacy - d/c lisinopril 40mg  tablet**    Coreg 25 Mg Tabs (Carvedilol) .[name]... 1 tablet by mouth two times a day    Aspirin 81 Mg  Tabs (Aspirin) .[name]... Take 1 tablet by mouth once a day    Furosemide 40 Mg Tabs (Furosemide) .[name]... 1 tab by mouth daily  Problem # 2:  CONGESTIVE HEART FAILURE (ICD-428.0) She had an episode recently of shortness of breath. This correlates in time with an a major flare of her optivol. This resolved spontaneously. She denies changes in diet and or fluid intake. In the event that she has more symptoms, repeating her ultrasound I think would  be appropriate Her updated medication list for this problem includes:    Lisinopril 20 Mg Tabs (Lisinopril) .[name]... Take 2 tablets by mouth daily for blood pressure **pharmacy - d/c lisinopril 40mg  tablet**    Coreg 25 Mg Tabs (Carvedilol) .[name]... 1 tablet by mouth two times a day    Aspirin 81 Mg Tabs (Aspirin) .[name]... Take 1 tablet by mouth once a day    Furosemide 40 Mg Tabs (Furosemide) .[name]... 1 tab by mouth daily  Problem # 3:  IMPLANTATION OF DEFIBRILLATOR, HX OF (ICD-V45.02) Device parameters and data were reviewed and no changes were made  Patient Instructions: 1)  Your physician recommends that you schedule a follow-up appointment in: [place]

## 2010-06-07 NOTE — Progress Notes (Signed)
Summary: Query:  refill furosemide?  Phone Note Outgoing Call   Summary of Call: Refill furosemide?  Last refill dated 2/11 x4 refills.  Large gap noted. Initial call taken by: Sherian Maroon RN,  March 14, 2010 5:38 PM  Follow-up for Phone Call        ok to refill Follow-up by: Aurora Mask FNP,  March 14, 2010 6:20 PM  Additional Follow-up for Phone Call Additional follow up Details #1::        Noted - refill completed.  Sherian Maroon RN  March 15, 2010 9:40 AM

## 2010-06-07 NOTE — Assessment & Plan Note (Signed)
Summary: Allergic Rhinitis   Vital Signs:  Patient profile:   65 year old female Menstrual status:  postmenopausal Height:      58.5 inches Weight:      167 pounds BMI:     34.43 Temp:     97.6 degrees F oral Pulse rate:   77 / minute Pulse rhythm:   regular Resp:     18 per minute BP sitting:   149 / 80  (left arm) Cuff size:   large  Vitals Entered By: [place] Shannon (August 18, 2009 2:28 PM) CC: pt is here for seasonal allergies..., Hypertension Management, CHF Management Is Patient Diabetic? No Pain Assessment Patient in pain? no       Does patient need assistance? Functional Status Self care Ambulation Normal   Primary Care Provider:  [facility]  CC:  pt is here for seasonal allergies..., Hypertension Management, and CHF Management.  History of Present Illness:  Pt into the office for allergies. hx of allergic rhinitis no current medications but has taken medicaitons in the past  Obesity - Pt has started water aerobics three times per week. When she gets out of the water then her legs feel heavy.  Last for about 10-15 minutes after the aerobics then resolves. She walks on the treadmill and does aerobics prior to getting in the pool 2 pound weight loss since her last visit  Colonscopy - done since last visit.  Will repeat in 10 years  Cardiology - pt is maintaining routine f/u.  next appt is in June. She is taking medications as ordered  No current medications present today in office  CHF History:      The patient has not been enrolled in the CHF Eduction Program.    Hypertension History:      She denies headache, chest pain, and palpitations.  pt is taking medications as ordered.        Positive major cardiovascular risk factors include female age 24 years old or older, hypertension, and family history for ischemic heart disease (females less than 20 years old & males less than 20 years old).  Negative major cardiovascular risk factors include  non-tobacco-user status.        Positive history for target organ damage include cardiac end organ damage (either CHF or LVH).      Habits & Providers  Alcohol-Tobacco-Diet     Alcohol type: wine     Tobacco Status: never  Exercise-Depression-Behavior     Does Patient Exercise: yes     Exercise Counseling: to improve exercise regimen     Type of exercise: water aerobic     Depression Counseling: not indicated; screening negative for depression     Drug Use: no     Seat Belt Use: 100     Sun Exposure: occasionally  Current Medications (verified): 1)  Lisinopril 20 Mg Tabs (Lisinopril) .... Take 2 Tablets By Mouth Daily For Blood Pressure **pharmacy - D/c Lisinopril 40mg  Tablet** 2)  Coreg 25 Mg  Tabs (Carvedilol) .[name].. 1 Tablet By Mouth Two Times A Day 3)  Levothyroxine Sodium 125 Mcg Tabs (Levothyroxine Sodium) .... One Tablet By Mouth Daily For Thyroid **note Change in Dose** 4)  Ventolin Hfa 108 (90 Base) Mcg/act  Aers (Albuterol Sulfate) .... Inhale When Needed 5)  Aspirin 81 Mg  Tabs (Aspirin) .... Take 1 Tablet By Mouth Once A Day 6)  Potassium Chloride 20 Meq  Pack (Potassium Chloride) .... Hold 7)  Furosemide 40 Mg  Tabs (Furosemide) .Marland KitchenMarland KitchenMarland Kitchen  1 Tab By Mouth Daily 8)  Trazodone Hcl 50 Mg  Tabs (Trazodone Hcl) .[name].. 1 Tab By Mouth Q Hs 9)  Naprosyn 500 Mg Tabs (Naproxen) .[name].. 1 Tablet By Mouth Two Times A Day As Needed For Pain 10)  Ultram 50 Mg Tabs (Tramadol Hcl) .[name].. 1 Tablet By Mouth Two Times A Day As Needed For Pain 11)  Triamcinolone Acetonide 0.1 % Crea (Triamcinolone Acetonide) .[name].. 1 Application Topically To Affected Area 12)  Nasacort Aq 55 Mcg/act Aers (Triamcinolone Acetonide(Nasal)) .[name].. 1 Spray in Each Nostril 13)  Proventil Hfa 108 (90 Base) Mcg/act Aers (Albuterol Sulfate) .... 2 Puffs Every 6 Hours As Needed For Shortness of Breath 14)  Hydralazine Hcl 25 Mg Tabs (Hydralazine Hcl) .... Take One Tablet By Mouth Twice Day 15)  Voltaren 1 % Gel (Diclofenac Sodium)  .... One Application Topically To Bilateral Knees Three Times A Day As Needed 16)  Vitamin D (Ergocalciferol) 50000 Unit Caps (Ergocalciferol) .... One Capsule By Mouth Weekly  Allergies (verified): 1)  ! Percocet  Review of Systems General:  Denies loss of appetite. CV:  Denies chest pain or discomfort. Resp:  Denies cough. GI:  Denies abdominal pain. MS:  Complains of joint pain; right leg pain especially when she gets out of the pool. Allergy:  Complains of itching eyes, seasonal allergies, and sneezing.  Physical Exam  General:  alert.   Head:  short hair Eyes:  glasses Ears:  bil TM with clear fluid Mouth:  fair dentition.   Lungs:  normal breath sounds.   no wheezes or rhonchi Heart:  normal rate and regular rhythm.   Msk:  up to the exam table Neurologic:  alert & oriented X3.   Skin:  color normal.   Psych:  Oriented X3.     Impression & Recommendations:  Problem # 1:  ALLERGIC RHINITIS (ICD-477.9) advised pt of Dx- need to restart medications The following medications were removed from the medication list:    Xyzal 5 Mg Tabs (Levocetirizine dihydrochloride) ..... One tablet by mouth daily for allergies Her updated medication list for this problem includes:    Fluticasone Propionate 50 Mcg/act Susp (Fluticasone propionate) ..... One spray in each nostril two times a day (hold head down)    Loratadine 10 Mg Tabs (Loratadine) ..... One tablet by mouth daily for allergies  Problem # 2:  HYPOTHYROIDISM NOS (ICD-244.9) will check labs today Her updated medication list for this problem includes:    Levothyroxine Sodium 125 Mcg Tabs (Levothyroxine sodium) ..... One tablet by mouth daily for thyroid **note change in dose**  Orders: T-TSH [id number]) T-T3, Free [phone]) T-T4, Free ([phone])  Complete Medication List: 1)  Lisinopril 20 Mg Tabs (Lisinopril) .... Take 2 tablets by mouth daily for blood pressure **pharmacy - d/c lisinopril 40mg  tablet** 2)   Coreg 25 Mg Tabs (Carvedilol) .[name].. 1 tablet by mouth two times a day 3)  Levothyroxine Sodium 125 Mcg Tabs (Levothyroxine sodium) .... One tablet by mouth daily for thyroid **note change in dose** 4)  Ventolin Hfa 108 (90 Base) Mcg/act Aers (Albuterol sulfate) .... Inhale when needed 5)  Aspirin 81 Mg Tabs (Aspirin) .... Take 1 tablet by mouth once a day 6)  Potassium Chloride 20 Meq Pack (Potassium chloride) .... Hold 7)  Furosemide 40 Mg Tabs (Furosemide) .[name].. 1 tab by mouth daily 8)  Trazodone Hcl 50 Mg Tabs (Trazodone hcl) .[name].. 1 tab by mouth q hs 9)  Naprosyn 500 Mg Tabs (Naproxen) .[name].. 1 tablet by  mouth two times a day as needed for pain 10)  Ultram 50 Mg Tabs (Tramadol hcl) .[name].. 1 tablet by mouth two times a day as needed for pain 11)  Triamcinolone Acetonide 0.1 % Crea (Triamcinolone acetonide) .[name].. 1 application topically to affected area 12)  Fluticasone Propionate 50 Mcg/act Susp (Fluticasone propionate) .... One spray in each nostril two times a day (hold head down) 13)  Proventil Hfa 108 (90 Base) Mcg/act Aers (Albuterol sulfate) .... 2 puffs every 6 hours as needed for shortness of breath 14)  Hydralazine Hcl 25 Mg Tabs (Hydralazine hcl) .... Take one tablet by mouth twice day 15)  Voltaren 1 % Gel (Diclofenac sodium) .... One application topically to bilateral knees three times a day as needed 16)  Vitamin D (ergocalciferol) 50000 Unit Caps (Ergocalciferol) .... One capsule by mouth weekly 17)  Loratadine 10 Mg Tabs (Loratadine) .... One tablet by mouth daily for allergies  CHF Assessment/Plan:      The patient's current weight is 167 pounds.  Her previous weight was 169 pounds.    Hypertension Assessment/Plan:      The patient's hypertensive risk group is category C: Target organ damage and/or diabetes.  Her calculated 10 year risk of coronary heart disease is 7 %.  Today's blood pressure is 149/80.  Her blood pressure goal is < 140/90.  Patient Instructions: 1)  Allergies -  Allegra 180mg  by mouth is now available over the counter 2)  will order loratadine 10mg  by mouth daily 3)  Fluticasone nasal spray - use in each nostril two times a day (hold head down) 4)  Legs - be sure you are stretching before AND after you get in the pool. It may be beneficial to sit in the sauna afterward or at least 5 minutes 5)  Your thyroid will be checked today and you will be notified of the results Prescriptions: LORATADINE 10 MG TABS (LORATADINE) One tablet by mouth daily for allergies  #30 x 3   Entered and Authorized by:   Aurora Mask FNP   Signed by:   Aurora Mask FNP on 08/18/2009   Method used:   Print then Give to Patient   RxIDCZ:[id number] FLUTICASONE PROPIONATE 50 MCG/ACT SUSP (FLUTICASONE PROPIONATE) One spray in each nostril two times a day (hold head down)  #1 x 3   Entered and Authorized by:   Aurora Mask FNP   Signed by:   Aurora Mask FNP on 08/18/2009   Method used:   Print then Give to Patient   [id number]:[id number] XYZAL 5 MG TABS (LEVOCETIRIZINE DIHYDROCHLORIDE) One tablet by mouth daily for allergies  #30 x 3   Entered and Authorized by:   Aurora Mask FNP   Signed by:   Aurora Mask FNP on 08/18/2009   Method used:   Faxed to ...       [facility] (retail)       [place], [place]  [address]       Ph: [phone] [place]       Fax: [phone]   RxID:   [id number] NASACORT AQ 55 MCG/ACT AERS (TRIAMCINOLONE ACETONIDE(NASAL)) 1 spray in each nostril  #1 x 3   Entered and Authorized by:   Aurora Mask FNP   Signed by:   Aurora Mask FNP on 08/18/2009   Method used:   Faxed to ...       [facility] (retail)       [place], [place]  [address]       Ph: [phone] [place]       Fax: [phone]   RxID:   [id number]

## 2010-06-07 NOTE — Progress Notes (Signed)
Summary: Zoloft   Phone Note Call from Patient   Summary of Call: pt was in office today for an nurse visit and is asking to get a refill on Zoloft.  she uses [facility] on [address] Initial call taken by: Isla Pence,  November 09, 2009 1:26 PM  Follow-up for Phone Call        zoloft if not on pt's med list and no hx of depression ?? has pt been getting med from another office, ie mental health if so, she will need to request refills from that office Follow-up by: Aurora Mask FNP,  November 09, 2009 1:45 PM  Additional Follow-up for Phone Call Additional follow up Details #1::        spoke with pt and she informed me that the medication was filled by [facility] but was written from a Antony Haste @ [facility] it was written on 05/13/2009. I shared with her that we do not have that listed as a problem for her and with that she may need to schedule an appt to be seen by her provider before any medication would be written. Pt informed that once provider reviews I will call her to let her know the response. Isla Pence  November 09, 2009 4:41 PM     Additional Follow-up for Phone Call Additional follow up Details #2::    she will need to contact [facility] (she is an established pt there so it should not be a problem) and have them send or call in an Rx for her zoloft to [facility].  if she gets the written Rx this provider will co-sign so she can get it filled at [facility]. for this reason pt should bring all meds to each visit and they should be checked to be sure that the primary office is aware what other medications are being prescribed by specialist Follow-up by: Aurora Mask FNP,  November 10, 2009 8:14 AM  Additional Follow-up for Phone Call Additional follow up Details #3:: Details for Additional Follow-up Action Taken: pt informed of above information. Additional Follow-up by: Isla Pence,  November 11, 2009 10:41 AM

## 2010-06-07 NOTE — Assessment & Plan Note (Signed)
Summary: Depression   Vital Signs:  Patient profile:   65 year old female Menstrual status:  postmenopausal Height:      58.5 inches Weight:      166.9 pounds BMI:     34.41 Temp:     98.2 degrees F oral Pulse rate:   72 / minute Pulse rhythm:   regular Resp:     20 per minute BP sitting:   190 / 96  (left arm) Cuff size:   regular  Vitals Entered By: Isla Pence (January 07, 2010 2:32 PM)  Nutrition Counseling: Patient's BMI is greater than 25 and therefore counseled on weight management options. CC: follow-up visit, Depression, Hypertension Management, CHF Management Is Patient Diabetic? No Pain Assessment Patient in pain? no       Does patient need assistance? Functional Status Self care Ambulation Normal   Primary Care Breta Demedeiros:  Aurora Mask FNP  CC:  follow-up visit, Depression, Hypertension Management, and CHF Management.  History of Present Illness:  Pt into the office for f/u on dizziness and mood.    CHF History:      Daily weights are not being checked.  She understands fluid management and sodium restriction and is following this regimen.  The patient expresses understanding of the treatment plan and her medications.  She admits to lack of compliance with her medications.  The patient has not been enrolled in the CHF Eduction Program.    Depression History:      The patient denies a depressed mood most of the day and a diminished interest in her usual daily activities.  The patient denies fatigue (loss of energy) and recurrent thoughts of death or suicide.        Psychosocial stress factors include major life changes.  The patient denies that she feels like life is not worth living, denies that she wishes that she were dead, and denies that she has thought about ending her life.        Comments:  pt is doing well with restart of zoloft.  Depression Treatment History:  Prior Medication Used:   Start Date: Assessment of Effect:    Comments:  Zoloft (sertraline)     12/17/2009     --       improved  Hypertension History:      She denies headache, chest pain, and palpitations.  She notes no problems with any antihypertensive medication side effects.        Positive major cardiovascular risk factors include female age 79 years old or older, hypertension, and family history for ischemic heart disease (females less than 31 years old & males less than 75 years old).  Negative major cardiovascular risk factors include non-tobacco-user status.        Positive history for target organ damage include cardiac end organ damage (either CHF or LVH).  Further assessment for target organ damage reveals no history of ASHD, stroke/TIA, peripheral vascular disease, renal insufficiency, or hypertensive retinopathy.      Habits & Providers  Alcohol-Tobacco-Diet     Alcohol drinks/day: <1     Alcohol type: wine     Tobacco Status: never  Exercise-Depression-Behavior     Does Patient Exercise: yes     Exercise Counseling: to improve exercise regimen     Depression Counseling: not indicated; screening negative for depression     Drug Use: no     Seat Belt Use: 100     Sun Exposure: occasionally  Allergies (verified): 1)  !  Percocet  Review of Systems General:  dizziness is still present but not as bad as previous. Eyes:  Complains of blurring. CV:  Denies chest pain or discomfort. Resp:  Denies cough. GI:  Denies abdominal pain, nausea, and vomiting.  Physical Exam  General:  alert.   Eyes:  glasses Mouth:  fair dentition.   Lungs:  normal breath sounds.   Heart:  normal rate and regular rhythm.   Abdomen:  normal bowel sounds.   Msk:  up to the exam table Neurologic:  alert & oriented X3.     Impression & Recommendations:  Problem # 1:  DEPRESSION (ICD-311) zoloft has improved pts mood medication has improved Her updated medication list for this problem includes:    Trazodone Hcl 50 Mg Tabs (Trazodone hcl) .[name]...  1 tab by mouth q hs    Sertraline Hcl 100 Mg Tabs (Sertraline hcl) ..... One tablet by mouth daily nightly  Problem # 2:  DIZZINESS (ICD-780.4) advised pt to continue her exercises dizziness if improving Her updated medication list for this problem includes:    Loratadine 10 Mg Tabs (Loratadine) ..... One tablet by mouth daily for allergies    Meclizine Hcl 25 Mg Tabs (Meclizine hcl) .[name]... Take 1 by mouth every 12 hours as needed for significant dizziness  Problem # 3:  OBESITY (ICD-278.00) spoke with pt a about BMI she has started on exercise program with the seniors at the senior center  Problem # 4:  HYPERTENSION, BENIGN (ICD-401.1) BP is very elevated today. Advised pt to take medications as ordered check blood pressure in the community on the weekend and report values to this office next week Her updated medication list for this problem includes:    Lisinopril 20 Mg Tabs (Lisinopril) .[name]... Take 2 tablets by mouth daily for blood pressure    Coreg 25 Mg Tabs (Carvedilol) .[name]... 1 tablet by mouth two times a day    Furosemide 40 Mg Tabs (Furosemide) .[name]... 1 tab by mouth daily    Hydralazine Hcl 25 Mg Tabs (Hydralazine hcl) .[name]... Take one tablet by mouth twice day  Complete Medication List: 1)  Lisinopril 20 Mg Tabs (Lisinopril) .... Take 2 tablets by mouth daily for blood pressure 2)  Coreg 25 Mg Tabs (Carvedilol) .[name].. 1 tablet by mouth two times a day 3)  Levothyroxine Sodium 112 Mcg Tabs (Levothyroxine sodium) .... One tablet by mouth daily for thyroid **note change in dose** 4)  Ventolin Hfa 108 (90 Base) Mcg/act Aers (Albuterol sulfate) .... Inhale when needed 5)  Aspirin 81 Mg Tabs (Aspirin) .... Take 1 tablet by mouth once a day 6)  Furosemide 40 Mg Tabs (Furosemide) .[name].. 1 tab by mouth daily 7)  Trazodone Hcl 50 Mg Tabs (Trazodone hcl) .[name].. 1 tab by mouth q hs 8)  Naprosyn 500 Mg Tabs (Naproxen) .[name].. 1 tablet by mouth two times a day as needed for pain 9)  Ultram 50 Mg Tabs  (Tramadol hcl) .[name].. 1 tablet by mouth two times a day as needed for pain 10)  Triamcinolone Acetonide 0.1 % Crea (Triamcinolone acetonide) .[name].. 1 application topically to affected area 11)  Fluticasone Propionate 50 Mcg/act Susp (Fluticasone propionate) .... One spray in each nostril two times a day (hold head down) 12)  Proventil Hfa 108 (90 Base) Mcg/act Aers (Albuterol sulfate) .... 2 puffs every 6 hours as needed for shortness of breath 13)  Hydralazine Hcl 25 Mg Tabs (Hydralazine hcl) .... Take one tablet by mouth twice day 14)  Voltaren 1 % Gel (Diclofenac sodium) .... One application topically to bilateral knees three times a day as needed 15)  Vitamin D (ergocalciferol) 50000 Unit Caps (Ergocalciferol) .... One capsule by mouth weekly 16)  Loratadine 10 Mg Tabs (Loratadine) .... One tablet by mouth daily for allergies 17)  Meclizine Hcl 25 Mg Tabs (Meclizine hcl) .... Take 1 by mouth every 12 hours as needed for significant dizziness 18)  Sertraline Hcl 100 Mg Tabs (Sertraline hcl) .... One tablet by mouth daily nightly  CHF Assessment/Plan:      The patient's current weight is 166.9 pounds.  Her previous weight was 165.9 pounds.    Hypertension Assessment/Plan:      The patient's hypertensive risk group is category C: Target organ damage and/or diabetes.  Her calculated 10 year risk of coronary heart disease is 8 %.  Today's blood pressure is 190/96.  Her blood pressure goal is < 140/90.   Patient Instructions: 1)  Depression - you are tolerating your medications well 2)  Blood pressure - elevated today 3)  check your blood pressure at least twice over the weekend and report these values to this office on next week. 4)  Keep your appointment at cardiology as ordered 5)  You will be referred for the nutrition group.  you will be contacted about the time/date of the appointment. 6)  Follow up with n.martin,fnp in 2 months for high blood pressure. 7)  Flu vaccine will be due then

## 2010-06-07 NOTE — Progress Notes (Signed)
Summary: Query:  Refill Vit. D?  Phone Note Outgoing Call   Summary of Call: Do you want her Vit. D refilled?  Last Rx date was 05/2009 x3 months.   Initial call taken by: Sherian Maroon RN,  January 20, 2010 2:12 PM  Follow-up for Phone Call        no, she can take over the counter vitamin D 1012mcg by mouth daily Just needed prescription supplement x 3 months Follow-up by: Aurora Mask FNP,  January 20, 2010 2:50 PM  Additional Follow-up for Phone Call Additional follow up Details #1::        Left message on answering machine for pt. to return call.  Sherian Maroon RN  January 20, 2010 3:18 PM  Left message on answering machine for pt. to return call.  Sherian Maroon RN  January 21, 2010 10:07 AM  Advised pt. of provider's instructions -- verbalized understanding.  Sherian Maroon RN  January 21, 2010 3:03 PM     Additional Follow-up for Phone Call Additional follow up Details #2::    PT CALLED BACK TO SPEAK TO YOU  Follow-up by: Maurine Minister,  January 21, 2010 2:17 PM

## 2010-06-07 NOTE — Assessment & Plan Note (Signed)
Summary: per Morgan Mody PA-c/ return next Tuesday for nurse visit for repe...   Nurse Visit  CC: ear irrigation   Allergies: 1)  ! Percocet  Orders Added: 1)  Est. Patient Nurse visit [id number]

## 2010-06-07 NOTE — Assessment & Plan Note (Signed)
Summary: Neck pain   Vital Signs:  Patient profile:   65 year old female Menstrual status:  postmenopausal Weight:      169 pounds Temp:     97.5 degrees F oral Pulse rate:   88 / minute Pulse rhythm:   regular Resp:     18 per minute BP sitting:   180 / 96  (left arm) Cuff size:   regular  Vitals Entered By: Bridgett Larsson RN (April 07, 2010 5:07 PM) CC: F/U BP, pain neck, head, ears present for 1 mo., vertigo Is Patient Diabetic? No Pain Assessment Patient in pain? yes     Location: neck Intensity: 8 Type: aching Onset of pain  Intermittent  Does patient need assistance? Functional Status Self care Ambulation Normal   Primary Care Provider:  Aurora Mask FNP  CC:  F/U BP, pain neck, head, ears present for 1 mo., and vertigo.  History of Present Illness:  Pt into the office with pain in right side of neck still with intermittent vertigo - she did go to neurorehab as ordered pain in neck started about 3 weeks ago  Current Medications (verified): 1)  Lisinopril 20 Mg Tabs (Lisinopril) .... Take 2 Tablets By Mouth Daily For Blood Pressure 2)  Coreg 25 Mg  Tabs (Carvedilol) .[name].. 1 Tablet By Mouth Two Times A Day 3)  Levothyroxine Sodium 112 Mcg Tabs (Levothyroxine Sodium) .... One Tablet By Mouth Daily For Thyroid 4)  Ventolin Hfa 108 (90 Base) Mcg/act  Aers (Albuterol Sulfate) .... Inhale When Needed 5)  Aspirin 81 Mg  Tabs (Aspirin) .... Take 1 Tablet By Mouth Once A Day 6)  Furosemide 40 Mg  Tabs (Furosemide) .[name].. 1 Tab By Mouth Daily 7)  Trazodone Hcl 50 Mg  Tabs (Trazodone Hcl) .[name].. 1 Tab By Mouth Q Hs 8)  Naprosyn 500 Mg Tabs (Naproxen) .[name].. 1 Tablet By Mouth Two Times A Day As Needed For Pain 9)  Ultram 50 Mg Tabs (Tramadol Hcl) .[name].. 1 Tablet By Mouth Two Times A Day As Needed For Pain 10)  Triamcinolone Acetonide 0.1 % Crea (Triamcinolone Acetonide) .[name].. 1 Application Topically To Affected Area 11)  Fluticasone Propionate 50 Mcg/act Susp (Fluticasone  Propionate) .... One Spray in Each Nostril Two Times A Day (Hold Head Down) 12)  Proventil Hfa 108 (90 Base) Mcg/act Aers (Albuterol Sulfate) .... 2 Puffs Every 6 Hours As Needed For Shortness of Breath 13)  Hydralazine Hcl 25 Mg Tabs (Hydralazine Hcl) .... Take One Tablet By Mouth Twice Day 14)  Voltaren 1 % Gel (Diclofenac Sodium) .... One Application Topically To Bilateral Knees Three Times A Day As Needed 15)  Vitamin D (Ergocalciferol) 50000 Unit Caps (Ergocalciferol) .... One Capsule By Mouth Weekly 16)  Loratadine 10 Mg Tabs (Loratadine) .... One Tablet By Mouth Daily For Allergies 17)  Meclizine Hcl 25 Mg Tabs (Meclizine Hcl) .... Take 1 By Mouth Every 12 Hours As Needed For Significant Dizziness 18)  Sertraline Hcl 100 Mg Tabs (Sertraline Hcl) .... One Tablet By Mouth Daily Nightly  Allergies (verified): 1)  ! Percocet  Review of Systems CV:  Denies chest pain or discomfort. Resp:  Denies cough. GI:  Denies abdominal pain, nausea, and vomiting. MS:  Complains of stiffness; right side of neck.  Physical Exam  General:  alert.   Head:  normocephalic.   Neck:  tenderness with palpation of trapezius  ROM intact Lungs:  normal breath sounds.   Heart:  normal rate and regular rhythm.  Impression & Recommendations:  Problem # 1:  NECK PAIN, RIGHT (ICD-723.1) advised pt to get a new pillow use heat to right side of neck Her updated medication list for this problem includes:    Aspirin 81 Mg Tabs (Aspirin) .[name]... Take 1 tablet by mouth once a day    Naprosyn 500 Mg Tabs (Naproxen) .[name]... 1 tablet by mouth two times a day as needed for pain    Ultram 50 Mg Tabs (Tramadol hcl) .[name]... 1 tablet by mouth two times a day as needed for pain  Problem # 2:  NEED PROPHYLACTIC VACCINATION&INOCULATION FLU (ICD-V04.81) given today in office  Complete Medication List: 1)  Lisinopril 20 Mg Tabs (Lisinopril) .... Take 2 tablets by mouth daily for blood pressure 2)  Coreg 25 Mg Tabs  (Carvedilol) .[name].. 1 tablet by mouth two times a day 3)  Levothyroxine Sodium 112 Mcg Tabs (Levothyroxine sodium) .... One tablet by mouth daily for thyroid 4)  Ventolin Hfa 108 (90 Base) Mcg/act Aers (Albuterol sulfate) .... Inhale when needed 5)  Aspirin 81 Mg Tabs (Aspirin) .... Take 1 tablet by mouth once a day 6)  Furosemide 40 Mg Tabs (Furosemide) .[name].. 1 tab by mouth daily 7)  Trazodone Hcl 50 Mg Tabs (Trazodone hcl) .[name].. 1 tab by mouth q hs 8)  Naprosyn 500 Mg Tabs (Naproxen) .[name].. 1 tablet by mouth two times a day as needed for pain 9)  Ultram 50 Mg Tabs (Tramadol hcl) .[name].. 1 tablet by mouth two times a day as needed for pain 10)  Triamcinolone Acetonide 0.1 % Crea (Triamcinolone acetonide) .[name].. 1 application topically to affected area 11)  Fluticasone Propionate 50 Mcg/act Susp (Fluticasone propionate) .... One spray in each nostril two times a day (hold head down) 12)  Proventil Hfa 108 (90 Base) Mcg/act Aers (Albuterol sulfate) .... 2 puffs every 6 hours as needed for shortness of breath 13)  Hydralazine Hcl 25 Mg Tabs (Hydralazine hcl) .... Take one tablet by mouth twice day 14)  Voltaren 1 % Gel (Diclofenac sodium) .... One application topically to bilateral knees three times a day as needed 15)  Vitamin D (ergocalciferol) 50000 Unit Caps (Ergocalciferol) .... One capsule by mouth weekly 16)  Loratadine 10 Mg Tabs (Loratadine) .... One tablet by mouth daily for allergies 17)  Sertraline Hcl 100 Mg Tabs (Sertraline hcl) .... One tablet by mouth daily nightly  Other Orders: Flu Vaccine 62yrs + [id number]) Admin 1st Vaccine [phone])  Patient Instructions: 1)  You have been given the flu vaccine today 2)  Blood pressure - still elevated 3)  You will need to take all your current medications 4)  Neck - Get a new pillow 5)  May use warm compress or heat to right side of neck 6)  Follow up for a blood pressure check - triage visit in 1 week. (afternoon appointment) 7)  Take all your  medications before this visit   Orders Added: 1)  Flu Vaccine 25yrs + [[id number]] 2)  Admin 1st Vaccine [[id number]] 3)  Est. Patient Level III [id number]   Immunizations Administered:  Influenza Vaccine # 1:    Vaccine Type: Fluvax 3+    Site: left deltoid    Mfr: GlaxoSmithKline    Dose: 0.5 ml    Route: IM    Given by: Bridgett Larsson RN    Exp. Date: 11/05/2010    Lot #: [id number]    VIS given: 11/30/09 version given April 07, 2010.  Flu Vaccine Consent Questions:  Do you have a history of severe allergic reactions to this vaccine? no    Any prior history of allergic reactions to egg and/or gelatin? no    Do you have a sensitivity to the preservative Thimersol? no    Do you have a past history of Guillan-Barre Syndrome? no    Do you currently have an acute febrile illness? no    Have you ever had a severe reaction to latex? no    Vaccine information given and explained to patient? yes    Are you currently pregnant? no   Immunizations Administered:  Influenza Vaccine # 1:    Vaccine Type: Fluvax 3+    Site: left deltoid    Mfr: GlaxoSmithKline    Dose: 0.5 ml    Route: IM    Given by: Bridgett Larsson RN    Exp. Date: 11/05/2010    Lot #: [id number]    VIS given: 11/30/09 version given April 07, 2010.

## 2010-06-07 NOTE — Assessment & Plan Note (Signed)
Summary: martin fnp/ dizziness/ thyroid issues//gk   Vital Signs:  Patient profile:   65 year old female Menstrual status:  postmenopausal Weight:      166 pounds Temp:     97.7 degrees F Pulse rate:   68 / minute Pulse (ortho):   68 / minute Pulse rhythm:   regular Resp:     18 per minute BP sitting:   158 / 78  (right arm) BP standing:   140 / 60 Cuff size:   large  Vitals Entered By: Shellia Carwin CMA (November 03, 2009 9:31 AM)  Serial Vital Signs/Assessments:  Time      Position  BP       Pulse  Resp  Temp     By           Lying LA  150/80   80                    Xavia Kniskern PA-C           Sitting   150/64   [address] PA-C           Standing  140/60   50                    Richardson Dopp PA-C  CC: Dizzy feeling and pain in both ears also since Sunday Is Patient Diabetic? No Pain Assessment Patient in pain? yes     Location: head Intensity: 7  Does patient need assistance? Ambulation Normal   Primary Care Provider:  Aurora Mask FNP  CC:  Dizzy feeling and pain in both ears also since Sunday.  History of Present Illness: Patient comes in to the office with complaints of dizziness since Sunday (4 days).  Has had this before and told she had allergies/congestion.  Ears hurt bilaterally.  No tinnitus.  No hearing loss.  No falling.  No fevers.  No sore throat.  No cough.  No dyspnea out of the ordinary.  No orthopnea or PND.  No chest pain.  No ICD therapies.   No syncope.  Notes spinning.  Can reproduce by turning over in bed.  She thinks turning to the right may make it worse and turning to the left may make it better.  Does feel like being "drunk."    Wants thyroid checked.  Feels bloated.  Feels tired.  No diarrhea or constipation.  No cold or heat intol.  No palps.  Problems Prior to Update: 1)  Cardiomyopathy-resolved  (ICD-425.4) 2)  Implantation of Defibrillator, Hx of  (ICD-V45.02) 3)  Bundle Branch Block, Left  (ICD-426.3) 4)   Congestive Heart Failure  (ICD-428.0) 5)  Obesity  (ICD-278.00) 6)  Fatigue  (ICD-780.79) 7)  Screening, Colon Cancer  (ICD-V76.51) 8)  Unspecified Breast Screening  (ICD-V76.10) 9)  Need Prophylactic Vaccination&inoculation Flu  (ICD-V04.81) 10)  Eustachian Tube Dysfunction  (ICD-381.81) 11)  Lumbago  (ICD-724.2) 12)  Bursitis, Left Hip  (ICD-726.5) 13)  Osteopenia  (ICD-733.90) 14)  Allergic Rhinitis  (ICD-477.9) 15)  Bronchopneumonia  (ICD-485) 16)  Muscle Spasm, Back  (ICD-724.8) 17)  Abdominal Pain, Generalized  (ICD-789.07) 18)  Fibroids, Uterus  (ICD-218.9) 19)  Other Screening Breast Examination  (ICD-V76.19) 20)  Postmenopausal Status  (ICD-V49.81) 21)  Routine Gynecological Examination  (ICD-V72.31) 22)  Constipation  (ICD-564.00) 23)  Sprain&strain Unspec Site Shoulder&upper Arm  (  ICD-840.9) 24)  Obstructive Sleep Apnea  (ICD-327.23) 25)  COPD  (ICD-496) 26)  Hypertension, Benign  (ICD-401.1) 27)  Anxiety  (ICD-300.00) 28)  Other Acute Sinusitis  (ICD-461.8) 29)  Hypothyroidism Nos  (ICD-244.9) 30)  Hyperthyroidism  (ICD-242.90) 31)  Hepatitis C  (ICD-070.51) 32)  Hepatitis B, Hx of  (ICD-V12.09)  Allergies (verified): 1)  ! Percocet  Past History:  Past Medical History: Last updated: 09/16/2008 Current Problems:  BUNDLE BRANCH BLOCK, LEFT (ICD-426.3) COPD (ICD-496) HYPERTENSION, BENIGN (ICD-401.1) IMPLANTATION OF DEFIBRILLATOR, HX OF (0000000) implanted Concerto ICD, model   [id number], implanted on December 24, 2006 for nonischemic cardiomyopathy ANXIETY (ICD-300.00) OTHER ACUTE SINUSITIS (ICD-461.8) HYPOTHYROIDISM NOS (ICD-244.9) CONGESTIVE HEART FAILURE (ICD-428.0) HYPERTHYROIDISM (ICD-242.90) HEPATITIS C (ICD-070.51) HEPATITIS B, HX OF (ICD-V12.09)    Past Surgical History: Last updated: 01/04/2007 Appendectomy Cardiac catheterization   Physical Exam  General:  alert, well-developed, and well-nourished.   Head:  normocephalic and atraumatic.    Eyes:  pupils equal, pupils round, and pupils reactive to light.   Ears:  L ear normal and R cerumen impaction.   Nose:  no external deformity.   Mouth:  pharynx pink and moist, no erythema, and no exudates.   Neck:  supple, no carotid bruits, and no cervical lymphadenopathy.   Lungs:  normal breath sounds, no crackles, and no wheezes.   Heart:  normal rate and regular rhythm.   1-2/6 sys murmur Extremities:  no edema Neurologic:  alert & oriented X3 and cranial nerves II-XII intact.   [employer] test attempted: She developed spinning sensation with lying back to the right; no symptoms on the left; she was unable to keep her eyes open on the right (nystagmus was not observed) her symptoms cleared very quickly  Psych:  normally interactive and good eye contact.     Impression & Recommendations:  Problem # 1:  DIZZINESS (ICD-780.4)  sounds very suspicious for benign positional vertigo and her exam seems to support this dx has had similar symptoms in the past will give meclizine refill flonase as she likely has some congestion contributing to otalgia; no signs of infection discussed vestib rehab with her and she would like to be referred refer to vestib rehab  Her updated medication list for this problem includes:    Loratadine 10 Mg Tabs (Loratadine) ..... One tablet by mouth daily for allergies    Meclizine Hcl 25 Mg Tabs (Meclizine hcl) .[name]... Take 1 by mouth every 12 hours as needed for significant dizziness  Orders: Physical Therapy Referral (PT)  Problem # 2:  HYPOTHYROIDISM NOS (ICD-244.9)  check f/u labs  Her updated medication list for this problem includes:    Levothyroxine Sodium 112 Mcg Tabs (Levothyroxine sodium) ..... One tablet by mouth daily for thyroid **note change in dose**  Orders: Physical Therapy Referral (PT) T-TSH [id number]) T-T4, Free [id number])  Problem # 3:  CERUMEN IMPACTION, RIGHT (ICD-380.4) cannot get wax out of right ear will give  cerumenex and have her return next week for repeat ear flush  Orders: Cerumen Impaction Removal [id number])  Complete Medication List: 1)  Lisinopril 20 Mg Tabs (Lisinopril) .... Take 2 tablets by mouth daily for blood pressure 2)  Coreg 25 Mg Tabs (Carvedilol) .[name].. 1 tablet by mouth two times a day 3)  Levothyroxine Sodium 112 Mcg Tabs (Levothyroxine sodium) .... One tablet by mouth daily for thyroid **note change in dose** 4)  Ventolin Hfa 108 (90 Base) Mcg/act Aers (Albuterol sulfate) .... Inhale when needed 5)  Aspirin 81  Mg Tabs (Aspirin) .... Take 1 tablet by mouth once a day 6)  Potassium Chloride 20 Meq Pack (Potassium chloride) .... Hold 7)  Furosemide 40 Mg Tabs (Furosemide) .[name].. 1 tab by mouth daily 8)  Trazodone Hcl 50 Mg Tabs (Trazodone hcl) .[name].. 1 tab by mouth q hs 9)  Naprosyn 500 Mg Tabs (Naproxen) .[name].. 1 tablet by mouth two times a day as needed for pain 10)  Ultram 50 Mg Tabs (Tramadol hcl) .[name].. 1 tablet by mouth two times a day as needed for pain 11)  Triamcinolone Acetonide 0.1 % Crea (Triamcinolone acetonide) .[name].. 1 application topically to affected area 12)  Fluticasone Propionate 50 Mcg/act Susp (Fluticasone propionate) .... One spray in each nostril two times a day (hold head down) 13)  Proventil Hfa 108 (90 Base) Mcg/act Aers (Albuterol sulfate) .... 2 puffs every 6 hours as needed for shortness of breath 14)  Hydralazine Hcl 25 Mg Tabs (Hydralazine hcl) .... Take one tablet by mouth twice day 15)  Voltaren 1 % Gel (Diclofenac sodium) .... One application topically to bilateral knees three times a day as needed 16)  Vitamin D (ergocalciferol) 50000 Unit Caps (Ergocalciferol) .... One capsule by mouth weekly 17)  Loratadine 10 Mg Tabs (Loratadine) .... One tablet by mouth daily for allergies 18)  Meclizine Hcl 25 Mg Tabs (Meclizine hcl) .... Take 1 by mouth every 12 hours as needed for significant dizziness 19)  Debrox 6.5 % Soln (Carbamide peroxide) .... 5-10 drops in  right ear two times a day for 4 days only  Patient Instructions: 1)  Use the Debrox for 4 days only. 2)  Return next Tuesday for nurse visit for repeat ear flush on right. 3)  Someone will call you to set up a referral to physical therapy. 4)  You can use the meclizine as needed for dizziness. 5)  Schedule a follow up if you feel worse or feel like you are not getting better. Prescriptions: DEBROX 6.5 % SOLN (CARBAMIDE PEROXIDE) 5-10 drops in right ear two times a day for 4 days only  #1 bottle x 0   Entered and Authorized by:   Richardson Dopp PA-C   Signed by:   Richardson Dopp PA-C on 11/03/2009   Method used:   Print then Give to Patient   RxID:   [id number] MECLIZINE HCL 25 MG TABS (MECLIZINE HCL) Take 1 by mouth every 12 hours as needed for significant dizziness  #20 x 1   Entered and Authorized by:   Richardson Dopp PA-C   Signed by:   Richardson Dopp PA-C on 11/03/2009   Method used:   Print then Give to Patient   RxID:   [id number] FLUTICASONE PROPIONATE 50 MCG/ACT SUSP (FLUTICASONE PROPIONATE) One spray in each nostril two times a day (hold head down)  #1 x 3   Entered and Authorized by:   Richardson Dopp PA-C   Signed by:   Richardson Dopp PA-C on 11/03/2009   Method used:   Print then Give to Patient   RxID:   [id number]    EKG  Procedure date:  11/03/2009  Findings:      V paced underlying sinus rhythm

## 2010-06-07 NOTE — Miscellaneous (Signed)
Summary: Rehab Report/DISCHARGE SUMMARY  Rehab Report/DISCHARGE SUMMARY   Imported By: Roland Earl 01/13/2010 12:22:07  _____________________________________________________________________  External Attachment:    Type:   Image     Comment:   External Document

## 2010-06-07 NOTE — Cardiovascular Report (Signed)
Summary: Office Visit   Office Visit   Imported By: Sallee Provencal 02/17/2010 11:59:06  _____________________________________________________________________  External Attachment:    Type:   Image     Comment:   External Document

## 2010-06-07 NOTE — Letter (Signed)
Summary: *HS[facility] Results Follow up  [place], [place] [address]   Phone: [phone]  Fax: [phone]      11/04/2009   CHERYL[address]ANN O[address]LEY [address] [place], [place]  [address]   Dear  Ms. Leilah Frohlich,                            ____S.Drinkard,FNP   ____D. Gore,FNP       ____B. McPherson,MD   ____V. Rankins,MD    ____E. Mulberry,MD    ____N. Hassell Done, FNP  ____D. Jobe Igo, MD    ____K. Tomma Lightning, MD    __x__S. Kathlen Mody, PA-C     This letter is to inform you that your recent test(s):  _______Pap Smear    ___x____Lab Test     _______X-ray    ___x____ is within acceptable limits  _______ requires a medication change  _______ requires a follow-up lab visit  _______ requires a follow-up visit with your provider   Comments: Thyroid levels are normal.       _________________________________________________________ If you have any questions, please contact our office                     Sincerely,  Richardson Dopp PA-C [facility]

## 2010-06-07 NOTE — Cardiovascular Report (Signed)
Summary: Office Visit   Office Visit   Imported By: Sallee Provencal 10/29/2009 09:53:07  _____________________________________________________________________  External Attachment:    Type:   Image     Comment:   External Document

## 2010-06-07 NOTE — Letter (Signed)
Summary: Previsit letter  [facility]  [place], [place] [address]   Phone: [phone]  Fax: [phone]       06/03/2009 MRN: [id number]  [facility] [place], [place]  [address]  Dear Ms. Rutherford Nail,  Welcome to the Gastroenterology Division at [facility].    You are scheduled to see a nurse for your pre-procedure visit on 06-25-09 at 2:00p.m. on the 3rd floor at [facility], [place] [employer].  We ask that you try to arrive at our office 15 minutes prior to your appointment time to allow for check-in.  Your nurse visit will consist of discussing your medical and surgical history, your immediate family medical history, and your medications.    Please bring a complete list of all your medications or, if you prefer, bring the medication bottles and we will list them.  We will need to be aware of both prescribed and over the counter drugs.  We will need to know exact dosage information as well.  If you are on blood thinners (Coumadin, Plavix, Aggrenox, Ticlid, etc.) please call our office today/prior to your appointment, as we need to consult with your physician about holding your medication.   Please be prepared to read and sign documents such as consent forms, a financial agreement, and acknowledgement forms.  If necessary, and with your consent, a friend or relative is welcome to sit-in on the nurse visit with you.  Please bring your insurance card so that we may make a copy of it.  If your insurance requires a referral to see a specialist, please bring your referral form from your primary care physician.  No co-pay is required for this nurse visit.     If you cannot keep your appointment, please call [phone] to cancel or reschedule prior to your appointment date.  This allows [name] the opportunity to schedule an appointment for another patient in need of care.    Thank you for choosing [facility] for your  medical needs.  We appreciate the opportunity to care for you.  Please visit [name] at our website  to learn more about our practice.                     Sincerely.                                                                                                                   The Gastroenterology Division

## 2010-06-07 NOTE — Miscellaneous (Signed)
Summary: Lec previsit  Clinical Lists Changes  Medications: Added new medication of MOVIPREP 100 GM  SOLR (PEG-KCL-NACL-NASULF-NA ASC-C) As per prep instructions. - Signed Rx of MOVIPREP 100 GM  SOLR (PEG-KCL-NACL-NASULF-NA ASC-C) As per prep instructions.;  #1 x 0;  Signed;  Entered by: Cornelia Copa RN;  Authorized by: [name] MD, [facility];  Method used: Electronically to [facility] [phone]*, [address], [place], [place]  [address], Ph: [phone], Fax: [phone]    Prescriptions: MOVIPREP 100 GM  SOLR (PEG-KCL-NACL-NASULF-NA ASC-C) As per prep instructions.  #1 x 0   Entered by:   Cornelia Copa RN   Authorized by:   [name] MD, [facility]   Signed by:   Cornelia Copa RN on 06/25/2009   Method used:   Electronically to        [facility] [phone]* (retail)       [address]       [place], [place]  [address]       Ph: [phone]       Fax: [phone]   [place]:   [phone]

## 2010-06-07 NOTE — Assessment & Plan Note (Signed)
Summary: HTN/COPD   Vital Signs:  Patient profile:   65 year old female Menstrual status:  postmenopausal Weight:      168.2 pounds BMI:     34.68 BSA:     1.70 O2 Sat:      100 % Temp:     97.5 degrees F oral Pulse rate:   71 / minute Pulse rhythm:   regular Resp:     16 per minute BP sitting:   159 / 85  (left arm) Cuff size:   regular  Vitals Entered By: Isla Pence (May 27, 2009 3:46 PM) CC: pain across top of buttocks, Hypertension Management, CHF Management Is Patient Diabetic? No  Does patient need assistance? Functional Status Self care Ambulation Normal     Menstrual Status postmenopausal Last PAP Result  Specimen Adequacy: Satisfactory for evaluation.   Interpretation/Result:Negative for intraepithelial Lesion or Malignancy.      Primary Care Provider:  [facility]  CC:  pain across top of buttocks, Hypertension Management, and CHF Management.  History of Present Illness:  Pt into the office for with multiple problems.  Obesity - Pt reports a recent weight gain of at least 5 pounds despite her exercise - water aerobic.  Knee pain - increasing since the weight gain.  Thyroid - reports she is taking her thyroid medications daily. Last check in 09/2008.  Reports daily bowel movements.  No constipation  Colonscopy - never had a colonscopy.  No family hx of colon cancer normal bowel habits.  Eats cereal with fiber.  CHF History:      The patient has not been enrolled in the CHF Eduction Program.    Hypertension History:      She denies headache, chest pain, and palpitations.  Pt has not taken coreg today. She needs a new Rx sent to the pharmacy.  She has changed pharmacies. [name]        Positive major cardiovascular risk factors include female age 4 years old or older, hypertension, and family history for ischemic heart disease (females less than 66 years old & males less than 5 years old).  Negative major cardiovascular risk factors include  non-tobacco-user status.        Positive history for target organ damage include cardiac end organ damage (either CHF or LVH).      Habits & Providers  Alcohol-Tobacco-Diet     Alcohol type: wine     Tobacco Status: never  Exercise-Depression-Behavior     Does Patient Exercise: yes     Exercise Counseling: to improve exercise regimen     Type of exercise: water aerobic     Have you felt down or hopeless? no     Have you felt little pleasure in things? yes     Depression Counseling: not indicated; screening negative for depression     Drug Use: no     Seat Belt Use: 100     Sun Exposure: occasionally  Allergies (verified): 1)  ! Percocet  Social History: Does Patient Exercise:  yes  Review of Systems CV:  Denies chest pain or discomfort. Resp:  Denies cough. GI:  Denies abdominal pain, constipation, nausea, and vomiting. MS:  Complains of joint pain.  Physical Exam  General:  alert.   Head:  normocephalic.   Eyes:  glasses Lungs:  normal breath sounds.   Heart:  normal rate and regular rhythm.   Msk:  up to the exam table Neurologic:  alert & oriented X3.   Psych:  Oriented  X3.     Impression & Recommendations:  Problem # 1:  LUMBAGO (ICD-724.2) likely increased due to weight gain Her updated medication list for this problem includes:    Aspirin 81 Mg Tabs (Aspirin) .[name]... Take 1 tablet by mouth once a day    Naprosyn 500 Mg Tabs (Naproxen) .[name]... 1 tablet by mouth two times a day as needed for pain    Ultram 50 Mg Tabs (Tramadol hcl) .[name]... 1 tablet by mouth two times a day as needed for pain  Problem # 2:  COPD (ICD-496) stable. Her updated medication list for this problem includes:    Ventolin Hfa 108 (90 Base) Mcg/act Aers (Albuterol sulfate) ..... Inhale when needed    Proventil Hfa 108 (90 Base) Mcg/act Aers (Albuterol sulfate) .[name]... 2 puffs every 6 hours as needed for shortness of breath  Orders: Pulse Oximetry (single measurment) (94760) Peak Flow  Rate (94150)  Problem # 3:  HYPERTENSION, BENIGN (ICD-401.1) Coreg faxed to the pharmacy Her updated medication list for this problem includes:    Lisinopril 20 Mg Tabs (Lisinopril) .[name]... Take 2 tablets by mouth daily for blood pressure **pharmacy - d/c lisinopril 40mg  tablet**    Coreg 25 Mg Tabs (Carvedilol) .[name]... 1 tablet by mouth two times a day    Furosemide 40 Mg Tabs (Furosemide) .[name]... 1 tab by mouth daily    Hydralazine Hcl 25 Mg Tabs (Hydralazine hcl) .[name]... Take one tablet by mouth twice day  Orders: T-Comprehensive Metabolic Panel (A999333) T-CBC w/Diff [id number])  Problem # 4:  FATIGUE (ICD-780.79)  Orders: T-Vitamin D 25-Hydroxy & 1,25 Dihydroxy ([id number])  Problem # 5:  OBESITY (ICD-278.00) advised pt that she needs to lose weight  Problem # 6:  NEED PROPHYLACTIC VACCINATION&INOCULATION FLU (ICD-V04.81) indication: htn  Problem # 7:  UNSPECIFIED BREAST SCREENING (ICD-V76.10) was due 04/2009 but pt did not keep the appt Orders: Mammogram (Screening) (Mammo)  Problem # 8:  SCREENING, COLON CANCER (ICD-V76.51) never had colonscopy. will order Orders: Colonoscopy (Colon)  Complete Medication List: 1)  Lisinopril 20 Mg Tabs (Lisinopril) .... Take 2 tablets by mouth daily for blood pressure **pharmacy - d/c lisinopril 40mg  tablet** 2)  Coreg 25 Mg Tabs (Carvedilol) .[name].. 1 tablet by mouth two times a day 3)  Levothroid 137 Mcg Tabs (Levothyroxine sodium) .[name].. 1 tablet by mouth daily for thyroid 4)  Ventolin Hfa 108 (90 Base) Mcg/act Aers (Albuterol sulfate) .... Inhale when needed 5)  Aspirin 81 Mg Tabs (Aspirin) .... Take 1 tablet by mouth once a day 6)  Potassium Chloride 20 Meq Pack (Potassium chloride) .... Hold 7)  Furosemide 40 Mg Tabs (Furosemide) .[name].. 1 tab by mouth daily 8)  Trazodone Hcl 50 Mg Tabs (Trazodone hcl) .[name].. 1 tab by mouth q hs 9)  Naprosyn 500 Mg Tabs (Naproxen) .[name].. 1 tablet by mouth two times a day as needed for pain 10)  Ultram 50 Mg Tabs  (Tramadol hcl) .[name].. 1 tablet by mouth two times a day as needed for pain 11)  Triamcinolone Acetonide 0.1 % Crea (Triamcinolone acetonide) .[name].. 1 application topically to affected area 12)  Nasacort Aq 55 Mcg/act Aers (Triamcinolone acetonide(nasal)) .[name].. 1 spray in each nostril 13)  Proventil Hfa 108 (90 Base) Mcg/act Aers (Albuterol sulfate) .... 2 puffs every 6 hours as needed for shortness of breath 14)  Hydralazine Hcl 25 Mg Tabs (Hydralazine hcl) .... Take one tablet by mouth twice day 15)  Voltaren 1 % Gel (Diclofenac sodium) .... One application topically to bilateral  knees three times a day as needed  Other Orders: T-TSH [id number])  CHF Assessment/Plan:      The patient's current weight is 168.2 pounds.  Her previous weight was 168 pounds.    Hypertension Assessment/Plan:      The patient's hypertensive risk group is category C: Target organ damage and/or diabetes.  Her calculated 10 year risk of coronary heart disease is 7 %.  Today's blood pressure is 159/85.  Her blood pressure goal is < 140/90.  Patient Instructions: 1)  Keep up the good work with exercise.  This is VERY helpful for your overall health 2)  You have recieved the flu vaccine today 3)  You will be notified of any abnormal labs  4)  Keep your appointment for mammogram 5)  You will be scheduled for colonoscopy 6)  Knees/Back pain - most likely from weight gain.  Use the tramadol as needed.  Apply gel to bilateral knees two times a day (prescription sent to [facility]) 7)  Follow up at least every 3-4 months Prescriptions: VOLTAREN 1 % GEL (DICLOFENAC SODIUM) One application topically to bilateral knees three times a day as needed  #100gm x 0   Entered and Authorized by:   Aurora Mask FNP   Signed by:   Aurora Mask FNP on 05/27/2009   Method used:   Electronically to        [facility] [phone]* (retail)       [address]       [place], [place]  [address]       Ph: [phone]       Fax:  [phone]   RxID:   [id number] ULTRAM 50 MG TABS (TRAMADOL HCL) 1 tablet by mouth two times a day as needed for pain  #50 x 0   Entered and Authorized by:   Aurora Mask FNP   Signed by:   Aurora Mask FNP on 05/27/2009   Method used:   Electronically to        [facility] [phone]* (retail)       [place], [place]  [address]       Ph: [phone]       Fax: [phone]   RxID:   [id number] COREG 25 MG  TABS (CARVEDILOL) 1 tablet by mouth two times a day  #60 x 5   Entered and Authorized by:   Aurora Mask FNP   Signed by:   Aurora Mask FNP on 05/27/2009   Method used:   Electronically to        [facility] [phone]* (retail)       [place], [place]  [address]       Ph: [phone]       Fax: [phone]   RxID:   [id number]    Influenza Vaccine    Vaccine Type: Fluvax 3+    Site: right deltoid    Mfr: Foley    Dose: 0.5 ml    Route: IM    Given by: Isla Pence    Exp. Date: 11/04/2009    Lot #: [id number]    VIS given: 11/29/06 version given May 27, 2009.  Flu Vaccine Consent Questions    Do you have a history of severe allergic reactions to this vaccine? no    Any prior history of allergic reactions to egg and/or gelatin? no    Do you  have a sensitivity to the preservative Thimersol? no    Do you have a past history of Guillan-Barre Syndrome? no    Do you currently have an acute febrile illness? no    Have you ever had a severe reaction to latex? no    Vaccine information given and explained to patient? yes    Are you currently pregnant? no

## 2010-06-07 NOTE — Letter (Signed)
Summary: TEST ORDER FORM//MAMMOGRAM//APPT DATE & TIME  TEST ORDER FORM//MAMMOGRAM//APPT DATE & TIME   Imported By: Roland Earl 07/05/2009 14:22:22  _____________________________________________________________________  External Attachment:    Type:   Image     Comment:   External Document

## 2010-06-07 NOTE — Procedures (Signed)
Summary: Colonoscopy  Patient: Morgan Clay Note: All result statuses are Final unless otherwise noted.  Tests: (1) Colonoscopy (COL)   COL Colonoscopy           [facility]     [place], [place]  [address]           COLONOSCOPY PROCEDURE REPORT           PATIENT:  Morgan Clay, Morgan Clay  MR#:  [id number]     BIRTHDATE:  1945-07-30, 63 yrs. old  GENDER:  female           ENDOSCOPIST:  [name], MD, [facility]     Referred by:           Brock Ra, NP ([facility])           PROCEDURE DATE:  07/09/2009     PROCEDURE:  Colonoscopy with snare polypectomy     ASA CLASS:  Class III     INDICATIONS:  Routine Risk Screening           MEDICATIONS:   Fentanyl 50 mcg IV, Versed 5 mg IV           DESCRIPTION OF PROCEDURE:   After the risks benefits and     alternatives of the procedure were thoroughly explained, informed     consent was obtained.  Digital rectal exam was performed and     revealed no abnormalities.   The LB [id number] endoscope     was introduced through the anus and advanced to the cecum, which     was identified by both the appendix and ileocecal valve, without     limitations.  The quality of the prep was excellent, using     MoviPrep.  The instrument was then slowly withdrawn as the colon     was fully examined.     insertion: 1:21 minutes Withdarwal: 8:20 minutes     <<PROCEDUREIMAGES>>           FINDINGS:  A diminutive polyp was found in the sigmoid colon. It     was 3 mm in size. Polyp was snared without cautery. Retrieval was     successful.  Scattered diverticula were found throughout the     colon.  This was otherwise a normal examination of the colon.     Retroflexed views in the rectum revealed no abnormalities.    The     scope was then withdrawn from the patient and the procedure     completed.           COMPLICATIONS:  None           ENDOSCOPIC IMPRESSION:     1) 3 mm diminutive polyp in the sigmoid  colon - removed     2) Diverticula, scattered throughout the colon     3) Otherwise normal examination, excellent prep           REPEAT EXAM:  In for Colonoscopy, pending biopsy results.           [name], MD, [facility]           CC:  The Patient     Brock Ra, NP           n.     Lorrin Mais:   [name] at 07/09/2009 12:32 PM           Jeri Lager, [id number]  Note: An  exclamation mark (!) indicates a result that was not dispersed into the flowsheet. Document Creation Date: 07/09/2009 12:32 PM _______________________________________________________________________  (1) Order result status: Final Collection or observation date-time: 07/09/2009 12:25 Requested date-time:  Receipt date-time:  Reported date-time:  Referring Physician:   Ordering Physician: Silvano Rusk [id number]) Specimen Source:  Source: Tawanna Cooler Order Number: [id number] Lab site:   Appended Document: Colonoscopy     Procedures Next Due Date:    Colonoscopy: 07/2019

## 2010-06-07 NOTE — Miscellaneous (Signed)
Summary: Rehab Report/INITIAL SUMMARY  Rehab Report/INITIAL SUMMARY   Imported By: Roland Earl 11/17/2009 09:51:30  _____________________________________________________________________  External Attachment:    Type:   Image     Comment:   External Document

## 2010-06-07 NOTE — Letter (Signed)
Summary: Previsit letter  [facility]  [place], [place] [address]   Phone: [phone]  Fax: [phone]       06/02/2009 MRN: [id number]  [facility] [place], [place]  [address]  Dear Ms. Rutherford Nail,  Welcome to the Gastroenterology Division at [facility].    You are scheduled to see a nurse for your pre-procedure visit on 06-15-09 at 2:00p.m. on the 3rd floor at [facility], [place] [employer].  We ask that you try to arrive at our office 15 minutes prior to your appointment time to allow for check-in.  Your nurse visit will consist of discussing your medical and surgical history, your immediate family medical history, and your medications.    Please bring a complete list of all your medications or, if you prefer, bring the medication bottles and we will list them.  We will need to be aware of both prescribed and over the counter drugs.  We will need to know exact dosage information as well.  If you are on blood thinners (Coumadin, Plavix, Aggrenox, Ticlid, etc.) please call our office today/prior to your appointment, as we need to consult with your physician about holding your medication.   Please be prepared to read and sign documents such as consent forms, a financial agreement, and acknowledgement forms.  If necessary, and with your consent, a friend or relative is welcome to sit-in on the nurse visit with you.  Please bring your insurance card so that we may make a copy of it.  If your insurance requires a referral to see a specialist, please bring your referral form from your primary care physician.  No co-pay is required for this nurse visit.     If you cannot keep your appointment, please call [phone] to cancel or reschedule prior to your appointment date.  This allows [name] the opportunity to schedule an appointment for another patient in need of care.    Thank you for choosing [facility] for your medical  needs.  We appreciate the opportunity to care for you.  Please visit [name] at our website  to learn more about our practice.                     Sincerely.                                                                                                                   The Gastroenterology Division

## 2010-06-07 NOTE — Progress Notes (Signed)
Summary: Office Visit//DEPRESSION SCREENING  Office Visit//DEPRESSION SCREENING   Imported By: Roland Earl 12/22/2009 10:40:14  _____________________________________________________________________  External Attachment:    Type:   Image     Comment:   External Document

## 2010-06-07 NOTE — Assessment & Plan Note (Signed)
Summary: BP check//mm  Nurse Visit   Vital Signs:  Patient profile:   65 year old female Menstrual status:  postmenopausal Weight:      169.9 pounds Pulse rate:   68 / minute Pulse rhythm:   regular Resp:     18 per minute BP sitting:   154 / 74  (left arm) Cuff size:   regular  Vitals Entered By: Sherian Maroon RN (April 14, 2010 2:11 PM) CC: BP recheck Is Patient Diabetic? No Pain Assessment Patient in pain? yes     Location: neck, across buttocks Intensity: 8 Type: aching Onset of pain  constant, chronic  Does patient need assistance? Functional Status Self care Ambulation Normal   Primary Care Brisia Schuermann:  Aurora Mask FNP  CC:  BP recheck.  History of Present Illness: 04/07/10  BP 180/96.  Had been having pain in head, neck and ears with vertigo.  No change in meds for BP.  Still having the pain and the vertigo.  States medication for vertigo is not effective.  Took BP meds at 3 am and one at 8 am.   Patient Instructions: 1)  Reviewed with Marva Panda 2)  Your blood pressure is better -- no change in medications 3)  Keep taking your blood pressure medications as ordered 4)  We will set up a neurology referral for you -- they will contact you when there is an available appointment. 5)  Call cardiology reschedule your missed pacemaker check. 6)  Call if you have any questions or anything changes.   Impression & Recommendations:  Problem # 1:  HYPERTENSION, BENIGN (ICD-401.1) BP still elevated To keep taking current meds, no change at present To call cardiology to reschedule pacemaker check  Her updated medication list for this problem includes:    Lisinopril 20 Mg Tabs (Lisinopril) .[name]... Take 2 tablets by mouth daily for blood pressure    Coreg 25 Mg Tabs (Carvedilol) .[name]... 1 tablet by mouth two times a day    Furosemide 40 Mg Tabs (Furosemide) .[name]... 1 tab by mouth daily    Hydralazine Hcl 25 Mg Tabs (Hydralazine hcl) .[name]... Take one tablet by mouth twice  day  Problem # 2:  DIZZINESS (ICD-780.4) Wants referral for neurology set up  Her updated medication list for this problem includes:    Loratadine 10 Mg Tabs (Loratadine) ..... One tablet by mouth daily for allergies  Complete Medication List: 1)  Lisinopril 20 Mg Tabs (Lisinopril) .... Take 2 tablets by mouth daily for blood pressure 2)  Coreg 25 Mg Tabs (Carvedilol) .[name].. 1 tablet by mouth two times a day 3)  Levothyroxine Sodium 112 Mcg Tabs (Levothyroxine sodium) .... One tablet by mouth daily for thyroid 4)  Ventolin Hfa 108 (90 Base) Mcg/act Aers (Albuterol sulfate) .... Inhale when needed 5)  Aspirin 81 Mg Tabs (Aspirin) .... Take 1 tablet by mouth once a day 6)  Furosemide 40 Mg Tabs (Furosemide) .[name].. 1 tab by mouth daily 7)  Trazodone Hcl 50 Mg Tabs (Trazodone hcl) .[name].. 1 tab by mouth q hs 8)  Naprosyn 500 Mg Tabs (Naproxen) .[name].. 1 tablet by mouth two times a day as needed for pain 9)  Ultram 50 Mg Tabs (Tramadol hcl) .[name].. 1 tablet by mouth two times a day as needed for pain 10)  Triamcinolone Acetonide 0.1 % Crea (Triamcinolone acetonide) .[name].. 1 application topically to affected area 11)  Fluticasone Propionate 50 Mcg/act Susp (Fluticasone propionate) .... One spray in each nostril two times  a day (hold head down) 12)  Proventil Hfa 108 (90 Base) Mcg/act Aers (Albuterol sulfate) .... 2 puffs every 6 hours as needed for shortness of breath 13)  Hydralazine Hcl 25 Mg Tabs (Hydralazine hcl) .... Take one tablet by mouth twice day 14)  Voltaren 1 % Gel (Diclofenac sodium) .... One application topically to bilateral knees three times a day as needed 15)  Vitamin D (ergocalciferol) 50000 Unit Caps (Ergocalciferol) .... One capsule by mouth weekly 16)  Loratadine 10 Mg Tabs (Loratadine) .... One tablet by mouth daily for allergies 17)  Sertraline Hcl 100 Mg Tabs (Sertraline hcl) .... One tablet by mouth daily nightly   Review of Systems CV:  Complains of fatigue and swelling of feet;  gets dizzy a lot.  Gets occasional SOB, no precipitating factor.  Had   some swelling in her feet last week, resolved with elevation.  States headache located at back of head, comes and goes.  Denies cough. States visual changes -- going to see eye dr..   Physical Exam  General:  alert, well-developed, well-nourished, and well-hydrated.     Allergies: 1)  ! Percocet  Orders Added: 1)  Est. Patient Level I [id number]

## 2010-06-07 NOTE — Letter (Signed)
Summary: [facility] Instructions  [facility]  [place], [place] [address]   Phone: [phone]  Fax: [phone]       Morgan Clay    01-04-1946    MRN: [id number]        Procedure Day /Date:  Friday 07/09/2009     Arrival Time: 10:30 am      Procedure Time: 11:30 am     Location of Procedure:                    _x _  [facility] (4th Floor)   [place]   Starting 5 days prior to your procedure Sunday 2/27 do not eat nuts, seeds, popcorn, corn, beans, peas,  salads, or any raw vegetables.  Do not take any fiber supplements (e.g. Metamucil, Citrucel, and Benefiber).  THE DAY BEFORE YOUR PROCEDURE         DATE: Thursday 3/3  1.  Drink clear liquids the entire day-NO SOLID FOOD  2.  Do not drink anything colored red or purple.  Avoid juices with pulp.  No orange juice.  3.  Drink at least 64 oz. (8 glasses) of fluid/clear liquids during the day to prevent dehydration and help the prep work efficiently.  CLEAR LIQUIDS INCLUDE: Water Jello Ice Popsicles Tea (sugar ok, no milk/cream) Powdered fruit flavored drinks Coffee (sugar ok, no milk/cream) Gatorade Juice: apple, white grape, white cranberry  Lemonade Clear bullion, consomm, broth Carbonated beverages (any kind) Strained chicken noodle soup Hard Candy                             4.  In the morning, mix first dose of MoviPrep solution:    Empty 1 Pouch A and 1 Pouch B into the disposable container    Add lukewarm drinking water to the top line of the container. Mix to dissolve    Refrigerate (mixed solution should be used within 24 hrs)  5.  Begin drinking the prep at 5:00 p.m. The MoviPrep container is divided by 4 marks.   Every 15 minutes drink the solution down to the next mark (approximately 8 oz) until the full liter is complete.   6.  Follow completed prep with 16 oz of clear liquid of your choice (Nothing red or purple).   Continue to drink clear liquids until bedtime.  7.  Before going to bed, mix second dose of MoviPrep solution:    Empty 1 Pouch A and 1 Pouch B into the disposable container    Add lukewarm drinking water to the top line of the container. Mix to dissolve    Refrigerate  THE DAY OF YOUR PROCEDURE      DATE: Friday 3/4  Beginning at 6:30 am (5 hours before procedure):         1. Every 15 minutes, drink the solution down to the next mark (approx 8 oz) until the full liter is complete.  2. Follow completed prep with 16 oz. of clear liquid of your choice.    3. You may drink clear liquids until 9:30 am (2 HOURS BEFORE PROCEDURE).   MEDICATION INSTRUCTIONS  Unless otherwise instructed, you should take regular prescription medications with a small sip of water   as early as possible the morning of your procedure. .     Additional medication instructions: Do not take Lasix,or  HCTZ the am of procedure  OTHER INSTRUCTIONS  You will need a responsible adult at least 63 years of age to accompany you and drive you home.   This person must remain in the waiting room during your procedure.  Wear loose fitting clothing that is easily removed.  Leave jewelry and other valuables at home.  However, you may wish to bring a book to read or  an iPod/MP3 player to listen to music as you wait for your procedure to start.  Remove all body piercing jewelry and leave at home.  Total time from sign-in until discharge is approximately 2-3 hours.  You should go home directly after your procedure and rest.  You can resume normal activities the  day after your procedure.  The day of your procedure you should not:   Drive   Make legal decisions   Operate machinery   Drink alcohol   Return to work  You will receive specific instructions about eating, activities and medications before you leave.    The above instructions have been reviewed and explained to me by   Cornelia Copa  RN  June 25, 2009 2:29 PM     I fully understand and can verbalize these instructions _____________________________ Date _________

## 2010-06-07 NOTE — Procedures (Signed)
Summary: device/saf      Allergies Added:   Current Medications (verified): 1)  Lisinopril 20 Mg Tabs (Lisinopril) .... Take 2 Tablets By Mouth Daily For Blood Pressure 2)  Coreg 25 Mg  Tabs (Carvedilol) .[name].. 1 Tablet By Mouth Two Times A Day 3)  Levothyroxine Sodium 112 Mcg Tabs (Levothyroxine Sodium) .... One Tablet By Mouth Daily For Thyroid **note Change in Dose** 4)  Ventolin Hfa 108 (90 Base) Mcg/act  Aers (Albuterol Sulfate) .... Inhale When Needed 5)  Aspirin 81 Mg  Tabs (Aspirin) .... Take 1 Tablet By Mouth Once A Day 6)  Potassium Chloride 20 Meq  Pack (Potassium Chloride) .... Hold 7)  Furosemide 40 Mg  Tabs (Furosemide) .[name].. 1 Tab By Mouth Daily 8)  Trazodone Hcl 50 Mg  Tabs (Trazodone Hcl) .[name].. 1 Tab By Mouth Q Hs 9)  Naprosyn 500 Mg Tabs (Naproxen) .[name].. 1 Tablet By Mouth Two Times A Day As Needed For Pain 10)  Ultram 50 Mg Tabs (Tramadol Hcl) .[name].. 1 Tablet By Mouth Two Times A Day As Needed For Pain 11)  Triamcinolone Acetonide 0.1 % Crea (Triamcinolone Acetonide) .[name].. 1 Application Topically To Affected Area 12)  Fluticasone Propionate 50 Mcg/act Susp (Fluticasone Propionate) .... One Spray in Each Nostril Two Times A Day (Hold Head Down) 13)  Proventil Hfa 108 (90 Base) Mcg/act Aers (Albuterol Sulfate) .... 2 Puffs Every 6 Hours As Needed For Shortness of Breath 14)  Hydralazine Hcl 25 Mg Tabs (Hydralazine Hcl) .... Take One Tablet By Mouth Twice Day 15)  Voltaren 1 % Gel (Diclofenac Sodium) .... One Application Topically To Bilateral Knees Three Times A Day As Needed 16)  Vitamin D (Ergocalciferol) 50000 Unit Caps (Ergocalciferol) .... One Capsule By Mouth Weekly 17)  Loratadine 10 Mg Tabs (Loratadine) .... One Tablet By Mouth Daily For Allergies  Allergies (verified): 1)  ! Percocet   ICD Specifications Following MD:  Virl Axe, MD     ICD Vendor:  Medtronic     ICD Model Number:  [id number]     ICD Serial Number:  [id number] ICD DOI:  12/24/2006     ICD Implanting  MD:  Virl Axe, MD Research Study Name OPTIVOL-FINISHED  Lead 1:    Location: RA     DOI: 12/24/2006     Model #: [id number]     Serial #: [id number]     Status: active Lead 2:    Location: RV     DOI: 12/24/2006     Model #: [id number]     Serial #: [id number]     Status: active Lead 3:    Location: LV     DOI: 12/24/2006     Model #: [id number]     Serial #[id number]     Status: active  Indications::  NICM, CHF   ICD Follow Up Remote Check?  No Battery Voltage:  2.90 V     Charge Time:  11.1 seconds     Underlying rhythm:  SR ICD Dependent:  No       ICD Device Measurements Atrium:  Amplitude: 3.5 mV, Impedance: 512 ohms, Threshold: 1.0 V at 0.4 msec Right Ventricle:  Amplitude: 15.7 mV, Impedance: 456 ohms, Threshold: 1.0 V at 0.4 msec Left Ventricle:  Impedance: 416 ohms, Threshold: 1.0 V at 0.4 msec Configuration: LV TIP TO RV COIL Shock Impedance: 40/50 ohms   Episodes MS Episodes:  0     Shock:  0     ATP:  0  Nonsustained:  0     Atrial Pacing:  0.1%     Ventricular Pacing:  99.8%  Brady Parameters Mode DDD     Lower Rate Limit:  50     Upper Rate Limit 130 PAV 180     Sensed AV Delay:  160  Tachy Zones VF:  214     VT:  182     Next Remote Date:  01/06/2010     Next Cardiology Appt Due:  04/07/2010 Tech Comments:  Normal device function.  No changes made today.  Optivol was elevated, but has returned to baseline.  Pt does [facility] transmissions.  ROV 6 months SK. Chanetta Marshall RN BSN  October 06, 2009 2:26 PM

## 2010-06-07 NOTE — Cardiovascular Report (Signed)
Summary: Office Visit   Office Visit   Imported By: Sallee Provencal 07/08/2009 14:08:47  _____________________________________________________________________  External Attachment:    Type:   Image     Comment:   External Document

## 2010-06-07 NOTE — Letter (Signed)
Summary: REFERRAL/PHYSICAL THERAPY//APPT DATE & TIME  REFERRAL/PHYSICAL THERAPY//APPT DATE & TIME   Imported By: Roland Earl 11/22/2009 12:21:00  _____________________________________________________________________  External Attachment:    Type:   Image     Comment:   External Document

## 2010-06-09 NOTE — Cardiovascular Report (Signed)
Summary: Certified Letter Signed - Patient (not doing f/u)  Certified Letter Signed - Patient (not doing f/u)   Imported By: Ranell Patrick 05/25/2010 17:06:54  _____________________________________________________________________  External Attachment:    Type:   Image     Comment:   External Document

## 2010-06-09 NOTE — Letter (Signed)
Summary: Device-Delinquent Phone Proofreader, Barry  [address] [place]   [place], [place] [address]   Phone: [phone]  Fax: [phone]     May 06, 2010 MRN: [id number]   [place], [place]  [address]   Dear Ms. BARKO,  According to our records, you were scheduled for a device phone transmission on  04-07-2010.     We did not receive any results from this check.  If you transmitted on your scheduled day, please call us to help troubleshoot your system.  If you forgot to send your transmission, please send one upon receipt of this letter.  Thank you,  Shelly Bombard  May 06, 2010 2:16 PM   [facility] certified

## 2010-07-19 ENCOUNTER — Encounter: Payer: Self-pay | Admitting: Internal Medicine

## 2010-07-19 ENCOUNTER — Emergency Department (HOSPITAL_COMMUNITY): Payer: Medicare Other

## 2010-07-19 ENCOUNTER — Inpatient Hospital Stay (HOSPITAL_COMMUNITY)
Admission: EM | Admit: 2010-07-19 | Discharge: 2010-07-22 | DRG: 293 | Disposition: A | Discharge status: Home / Self Care | Payer: Medicare Other | Attending: Internal Medicine | Admitting: Internal Medicine

## 2010-07-19 DIAGNOSIS — J449 Chronic obstructive pulmonary disease, unspecified: Secondary | ICD-10-CM | POA: Diagnosis present

## 2010-07-19 DIAGNOSIS — Z7982 Long term (current) use of aspirin: Secondary | ICD-10-CM

## 2010-07-19 DIAGNOSIS — I428 Other cardiomyopathies: Secondary | ICD-10-CM | POA: Diagnosis present

## 2010-07-19 DIAGNOSIS — B1920 Unspecified viral hepatitis C without hepatic coma: Secondary | ICD-10-CM | POA: Diagnosis present

## 2010-07-19 DIAGNOSIS — E890 Postprocedural hypothyroidism: Secondary | ICD-10-CM | POA: Diagnosis present

## 2010-07-19 DIAGNOSIS — J4489 Other specified chronic obstructive pulmonary disease: Secondary | ICD-10-CM | POA: Diagnosis present

## 2010-07-19 DIAGNOSIS — F341 Dysthymic disorder: Secondary | ICD-10-CM | POA: Diagnosis present

## 2010-07-19 DIAGNOSIS — I509 Heart failure, unspecified: Principal | ICD-10-CM | POA: Diagnosis present

## 2010-07-19 DIAGNOSIS — I447 Left bundle-branch block, unspecified: Secondary | ICD-10-CM | POA: Diagnosis present

## 2010-07-19 DIAGNOSIS — Z95810 Presence of automatic (implantable) cardiac defibrillator: Secondary | ICD-10-CM

## 2010-07-19 DIAGNOSIS — E876 Hypokalemia: Secondary | ICD-10-CM | POA: Diagnosis present

## 2010-07-19 DIAGNOSIS — F1010 Alcohol abuse, uncomplicated: Secondary | ICD-10-CM | POA: Diagnosis present

## 2010-07-19 LAB — COMPREHENSIVE METABOLIC PANEL
ALT: 67 U/L — ABNORMAL HIGH (ref 0–35)
Alkaline Phosphatase: 68 U/L (ref 39–117)
CO2: 26 mEq/L (ref 19–32)
Chloride: 103 mEq/L (ref 96–112)
GFR calc non Af Amer: >60 mL/min (ref >60)
Glucose, Bld: 133 mg/dL — ABNORMAL HIGH (ref 70–99)
Potassium: 3 mEq/L — ABNORMAL LOW (ref 3.5–5.1)
Sodium: 139 mEq/L (ref 135–145)
Total Bilirubin: 1.4 mg/dL — ABNORMAL HIGH (ref 0.3–1.2)

## 2010-07-19 LAB — DIFFERENTIAL
Basophils Absolute: 0 10*3/uL (ref 0.0–0.1)
Lymphocytes Relative: 16 % (ref 12–46)
Lymphs Abs: 1.9 10*3/uL (ref 0.7–4.0)
Monocytes Absolute: 0.8 10*3/uL (ref 0.1–1.0)
Neutro Abs: 9.5 10*3/uL — ABNORMAL HIGH (ref 1.7–7.7)

## 2010-07-19 LAB — CBC
HCT: 37.6 % (ref 36.0–46.0)
Hemoglobin: 12.3 g/dL (ref 12.0–15.0)
MCV: 88.9 fL (ref 78.0–100.0)
WBC: 12.3 10*3/uL — ABNORMAL HIGH (ref 4.0–10.5)

## 2010-07-19 LAB — CK TOTAL AND CKMB (NOT AT ARMC): CK, MB: 1.2 ng/mL (ref 0.3–4.0)

## 2010-07-19 LAB — URINALYSIS, ROUTINE W REFLEX MICROSCOPIC
Glucose, UA: NEGATIVE mg/dL
Leukocytes, UA: NEGATIVE
Protein, ur: 30 mg/dL — AB
Specific Gravity, Urine: 1.013 (ref 1.005–1.030)
pH: 6.5 (ref 5.0–8.0)

## 2010-07-19 LAB — POCT CARDIAC MARKERS: Troponin i, poc: <0.05 ng/mL (ref 0.00–0.09)

## 2010-07-19 LAB — LIPASE, BLOOD: Lipase: 20 U/L (ref 11–59)

## 2010-07-19 LAB — URINE MICROSCOPIC-ADD ON

## 2010-07-20 DIAGNOSIS — R0602 Shortness of breath: Secondary | ICD-10-CM

## 2010-07-20 DIAGNOSIS — R079 Chest pain, unspecified: Secondary | ICD-10-CM

## 2010-07-20 DIAGNOSIS — I509 Heart failure, unspecified: Secondary | ICD-10-CM

## 2010-07-20 DIAGNOSIS — R109 Unspecified abdominal pain: Secondary | ICD-10-CM

## 2010-07-20 LAB — CARDIAC PANEL(CRET KIN+CKTOT+MB+TROPI)
CK, MB: 1.2 ng/mL (ref 0.3–4.0)
CK, MB: 1.5 ng/mL (ref 0.3–4.0)
Relative Index: INVALID (ref 0.0–2.5)
Relative Index: INVALID (ref 0.0–2.5)
Total CK: 82 U/L (ref 7–177)
Total CK: 87 U/L (ref 7–177)
Troponin I: 0.05 ng/mL (ref 0.00–0.06)
Troponin I: 0.04 ng/mL (ref 0.00–0.06)

## 2010-07-20 LAB — DIFFERENTIAL
Basophils Relative: 0 % (ref 0–1)
Eosinophils Absolute: 0.1 10*3/uL (ref 0.0–0.7)
Monocytes Relative: 6 % (ref 3–12)
Neutrophils Relative %: 69 % (ref 43–77)

## 2010-07-20 LAB — CBC
HCT: 38.3 % (ref 36.0–46.0)
Hemoglobin: 12.4 g/dL (ref 12.0–15.0)
MCH: 29.9 pg (ref 26.0–34.0)
MCH: 29.3 pg (ref 26.0–34.0)
MCHC: 33.3 g/dL (ref 30.0–36.0)
MCHC: 32.4 g/dL (ref 30.0–36.0)
Platelets: 150 10*3/uL (ref 150–400)
RBC: 4.08 MIL/uL (ref 3.87–5.11)
RBC: 4.23 MIL/uL (ref 3.87–5.11)

## 2010-07-20 LAB — COMPREHENSIVE METABOLIC PANEL
Albumin: 3.7 g/dL (ref 3.5–5.2)
BUN: 6 mg/dL (ref 6–23)
Calcium: 8.9 mg/dL (ref 8.4–10.5)
Creatinine, Ser: 1.02 mg/dL (ref 0.4–1.2)
Potassium: 3.7 mEq/L (ref 3.5–5.1)
Total Protein: 7.8 g/dL (ref 6.0–8.3)

## 2010-07-20 LAB — HIV ANTIBODY (ROUTINE TESTING W REFLEX): HIV: NONREACTIVE

## 2010-07-20 LAB — LIPID PANEL
Cholesterol: 107 mg/dL (ref 0–200)
LDL Cholesterol: 44 mg/dL (ref 0–99)

## 2010-07-20 LAB — HEMOGLOBIN A1C: Mean Plasma Glucose: 137 mg/dL — ABNORMAL HIGH (ref <117)

## 2010-07-21 ENCOUNTER — Encounter (INDEPENDENT_AMBULATORY_CARE_PROVIDER_SITE_OTHER): Payer: Self-pay | Admitting: Nurse Practitioner

## 2010-07-21 DIAGNOSIS — R109 Unspecified abdominal pain: Secondary | ICD-10-CM

## 2010-07-21 DIAGNOSIS — I509 Heart failure, unspecified: Secondary | ICD-10-CM

## 2010-07-21 DIAGNOSIS — R0602 Shortness of breath: Secondary | ICD-10-CM

## 2010-07-21 DIAGNOSIS — I5021 Acute systolic (congestive) heart failure: Secondary | ICD-10-CM

## 2010-07-21 LAB — BASIC METABOLIC PANEL
CO2: 31 mEq/L (ref 19–32)
Chloride: 105 mEq/L (ref 96–112)
GFR calc Af Amer: 58 mL/min — ABNORMAL LOW (ref >60)
Potassium: 4.4 mEq/L (ref 3.5–5.1)
Sodium: 145 mEq/L (ref 135–145)

## 2010-07-21 LAB — T4, FREE: Free T4: 0.87 ng/dL (ref 0.80–1.80)

## 2010-07-21 LAB — CONVERTED CEMR LAB
Calcium: 9 mg/dL
Creatinine, Ser: 1.07 mg/dL
Free T4: 0.87 ng/dL
Glucose, Bld: 94 mg/dL
HCT: 37.8 %
HDL: 51 mg/dL
Hemoglobin: 12.4 g/dL
LDL Cholesterol: 44 mg/dL
Platelets: 164 10*3/uL
Sodium: 138 meq/L
Triglycerides: 62 mg/dL
WBC: 7.4 10*3/uL

## 2010-07-21 LAB — CBC
Hemoglobin: 12.4 g/dL (ref 12.0–15.0)
RBC: 4.16 MIL/uL (ref 3.87–5.11)

## 2010-07-21 LAB — CARDIAC PANEL(CRET KIN+CKTOT+MB+TROPI)
CK, MB: 1.5 ng/mL (ref 0.3–4.0)
Troponin I: 0.03 ng/mL (ref 0.00–0.06)

## 2010-07-22 DIAGNOSIS — I359 Nonrheumatic aortic valve disorder, unspecified: Secondary | ICD-10-CM

## 2010-07-22 LAB — BASIC METABOLIC PANEL
BUN: 13 mg/dL (ref 6–23)
Creatinine, Ser: 1.07 mg/dL (ref 0.4–1.2)
GFR calc non Af Amer: 52 mL/min — ABNORMAL LOW (ref >60)

## 2010-07-24 NOTE — Discharge Summary (Addendum)
NAMEBURNIE, ALLEE NO.:  [id number]  MEDICAL RECORD NO.:  [id number]           PATIENT TYPE:  I  LOCATION:  6709                         FACILITY:  [facility]  PHYSICIAN:  Peter M. Martinique, M.D.  DATE OF BIRTH:  [birth year]  DATE OF ADMISSION:  07/19/2010 DATE OF DISCHARGE:                              DISCHARGE SUMMARY   PRIMARY CARDIOLOGIST:  [name], MD, [facility]  PRIMARY MEDICAL DOCTOR:  [facility].  CHIEF COMPLAINT:  Chest pain and shortness of breath.  HISTORY OF PRESENT ILLNESS:  Ms. Dunner is a 65 year old female with a past history of normal coronary arteries by cath in 2006, left bundle- branch block and nonischemic cardiomyopathy with an EF at its worst of 15% in 2008, normalized by last echo in September 2009 at 60% with LVH. She had ICD implant in August 2008.  Her last office visit was with Dr. Caryl Comes in March 2011.  The last transmission of ICD data was September 2011.  She missed her transmission in December.  She presented to [facility] with complaints of chest heaviness as well as shortness of breath.  Chest heaviness has been present since Sunday, constant, feeling like a brick on her chest.  She has had no interruption in this pain and has found nothing to make it better.  It is not associated with exertion, not made worse with exertion, occurs at rest, is not positional or worse with inspiration.  She has also had shortness of breath, which has been going on somewhat longer and somewhat since last week gradually and occurs at random.  She can have it both sitting, but also noticed marked dyspnea when she is doing her ADLs as well.  She has also complained of abdominal pain, sometimes it is located on the left lower quadrant like a gas sensation/soreness, and she had vomiting on Monday night without hematemesis.  Belching has made it worse.  It is not related to meals, although she insisted to the RN that it was very much so  exacerbated by food.  EKG is ventricularly paced.  Cardiac enzymes have remained negative x4.  The patient got Lasix 40 mg IV x1 yesterday, then was return for 80 mg IV b.i.d. x2.  However, the patient had relative hypotension this morning with blood pressure of 104 systolic, but her Lasix, hydralazine, and lisinopril were discontinued per the primary team.  Her blood pressure is more stable at present, running 120s to 130s.  She was asymptomatic with the lower blood pressure.  PAST MEDICAL HISTORY: 1. Normal coronary arteries by cath 2006. 2. Nonischemic cardiomyopathy.     a.     EF of 15% in February 2008.     b.     EF of 25% to 30% by echo in June 2008.     c.     Status post dual-chamber Medtronic BiV ICD August 2008.     d.     Normalized EF in August 2008 with last echo in September      20 09 showing EF of 60%, and some views of the posterior wall does  not appear as thickened now as well as it did before.  No regional      wall motion abnormalities.  Mild aortic valvular regurgitation and      mild valvular mitral regurgitation. 3. Left bundle-branch block. 4. COPD. 5. Hypertension. 6. Anxiety. 7. Hypothyroidism after having had hyperthyroidism treated with     radioactive iodine. 8. Hepatitis B and C. 9. Depression. 10.Question of noncompliance.  The patient did not report for her     December transmission from her ICD. There is also a note in her EMR from primary     care requesting a refill for Lasix where the nurse noted that the patient     had a long gap before refilling it.   INPATIENT MEDICATIONS: 1. Aspirin 81 mg daily. 2. Coreg 25 mg b.i.d. 3. Flonase 1 spray b.i.d. 4. Heparin per Pharmacy. 5. Imdur 30 mg daily. 6. Synthroid 112 mcg daily. 7. Avelox 400 mg daily. 8. Protonix 40 mg daily. 9. Potassium chloride 40 mEq q.4 h. per primary team. 10.Zoloft 100 mg daily. 11.Trazodone 50 mg at bedtime.  Her home medicine list is inconsistent.  The list of  the pharmacy check compiled is vastly different from her primary care list and what the patient says she is taking.  ALLERGIES:  PERCOCET.  SOCIAL HISTORY:  Ms. Autio is single.  She lives alone.  She has one daughter.  She denies any tobacco use.  She drinks 3 times a week, 2-3 glasses of wine or liquor in a what she says is a regular size glass. She denies any illicit drug use.  When asked about salt, Ms. Stefanek states that she tries to avoid it, which includes ordering at [facility] specifying no salt.  FAMILY HISTORY:  Mother and father both had MI in 59s.  She has a sister who possibly has lung cancer at 75.  REVIEW OF SYSTEMS:  No fever or chills.  She does not wears off.  No edema, syncope, orthopnea, but she does have occasional PND.  She does feel some abdominal distention, but no melena, hematemesis, or bright red blood per rectum.  All other systems reviewed and otherwise negative.  LABORATORY DATA:  WBC 9, hemoglobin 12.2, hematocrit 36.6, and platelet count 150.  Sodium 140, potassium 3.7, chloride 101, CO2 is 31, glucose 110, BUN 6, creatinine 1.02.  LFTs are normal today with exception of increased ALT of 41.  TSH is elevated at 10.551.  A1c is 6.4.  BNP 1621 and lipase 20.  Cardiac enzymes are negative x4.  UA showed trace blood, 30 protein, and squamous.  RADIOLOGY:  Chest x-ray on July 19, 2010, showed mild pulmonary edema. Nonspecific bibasilar opacities, suspicious for infection/aspiration.  PHYSICAL EXAMINATION:  VITAL SIGNS:  Temperature 98.4, pulse 74, respirations 20, blood pressure 128/74, and pulse ox 99% on room air. GENERAL:  This is an overweight, pleasant, African American female, in no acute distress, lying in 20 degrees, unlabored breathing. HEENT:  Normocephalic and atraumatic with extraocular movements intact. Clear sclerae.  Nares are without discharge. NECK:  Supple without carotid bruit or JVD. HEART:  Auscultation of the heart reveals  regular rate and rhythm with S1 and S2 without murmurs, rubs, or gallops. LUNGS:  Sounds have bilateral rales halfway way up bilaterally without wheezes and rhonchi. ABDOMEN:  Soft, nontender, nondistended with positive bowel sounds.  It is round. EXTREMITIES:  Warm and dry without edema.  She has 2+ pedal pulses bilaterally. NEUROLOGIC:  She is alert and  oriented x3.  She responds to questions appropriately with normal affect.  ASSESSMENT AND PLAN:  The patient was seen and examined by Dr. Martinique and myself.  This is a 65 year old female with a history of normal coronary arteries by cath in 2006; nonischemic cardiomyopathy with previous low ejection fraction of 15%, but most recently 60% by echo in 2009; implantable cardioverter-defibrillator implantation; left bundle- branch block; and chronic obstructive pulmonary disease who presents with complaints of chest pain and shortness of breath.  Chest x-ray compatible with pulmonary edema, her BNP is elevated, and her exam is consistent with volume overload particularly with attention to her lungs.  She needs further diuresis.  We recommend to continue her ACE inhibitor for it.  She is currently on nitrates.  We can add back hydralazine as her blood pressure allows.  We do not feel that her chest pain is consistent with ischemia, given constant nature and negative cardiac enzymes thus far.  The EKG is difficult to interpret given the ventricular pacing, maybe more related to her heart failure.  We will wait for the results of the 2-D echocardiogram with particular attention to her left ventricular function as well as requesting for any wall motion abnormalities.  We feel it is alright to discontinue her IV heparin.  We also agreed with Lasix 40 mg IV b.i.d., following her renal function along the way.  Thank you for the opportunity to participate in the care of this patient.     Melina Copa,  P.A.C.   ______________________________ Peter M. Martinique, M.D.    DD/MEDQ  D:  07/20/2010  T:  07/21/2010  Job:  [id number]  cc:   [name], MD, [facility]  Electronically Signed by Melina Copa  on 07/24/2010 02:37:15 PM Electronically Signed by PETER Martinique M.D. on 07/28/2010 03:40:37 PM

## 2010-07-25 LAB — CULTURE, BLOOD (ROUTINE X 2)
Culture  Setup Time: 201203132220
Culture: NO GROWTH

## 2010-07-26 LAB — CULTURE, BLOOD (ROUTINE X 2): Culture: NO GROWTH

## 2010-07-26 NOTE — Miscellaneous (Signed)
Summary: Hospital Admission  INTERNAL MEDICINE ADMISSION HISTORY AND PHYSICAL ***Place in front of thr Progress Notes Section of chart***   Attending:  Dr. Nance Pew 1st conact: Morgan Clay [phone] 2nd contact: Dr. Jerelene Redden [place], [place], After 5pm Weekdays: 1st contact: [phone] 2nd contact: [phone]  PCP: Aurora Mask, FNP ([facility])  CC:SOB  HPI:  [name] is a 65 yo F with a h/o of ischemic cardiomyopathy and CHF, s/p ICD placement in 2008, who presents with worsening SOB and a heavy sensation in her chest.  She also complaints accutely of diarrhea in the LLQ in the last 24 hours, and this is what brought her to the ED.  The SOB and chest discomfort has been waxing and waning over the past month or so.  It is exacerbated by activity and relieved by rest.  It is also partially relieved by the pt's albuterol inhaler.  It is not associated with pain, arm pain or numbness, neck pain, palpitations, or pre-syncope.   She endorses cough, productive of yellow sputum and rhinorrhea, but denies fever or sick contacts.  These symptoms have restricted her ability to perform her ADLs.  Regarding her GI symptoms, she developed LLQ pain last night, and then had a few loose brown stools.  She denies blood in the stools, melena, or contacts with recent diarrhea.  Incidentally, she reports recent dry mouth.  ALLERGIES: ! PERCOCET (itching, behavioral changes)   PAST MEDICAL HISTORY: COPD HTN LBBB CAD/Ischemic cardiomyopathy    - s/p Concerto ICD placement (12/24/2006) CHF   - EF 60% (2d echo 01/2008); EF of 15% at time of dx Hyperthyroidism HCV HBV Anxiety s/p Appendectomy s/p Tubal ligation     MEDICATIONS: LISINOPRIL 20 MG TABS (LISINOPRIL) Take 2 tablets by mouth daily for blood pressure COREG 25 MG  TABS (CARVEDILOL) 1 tablet by mouth two times a day LEVOTHYROXINE SODIUM 112 MCG TABS (LEVOTHYROXINE SODIUM) One tablet by mouth daily for thyroid VENTOLIN HFA 108 (90 BASE)  MCG/ACT  AERS (ALBUTEROL SULFATE) Inhale when needed ASPIRIN 81 MG  TABS (ASPIRIN) Take 1 tablet by mouth once a day FUROSEMIDE 40 MG  TABS (FUROSEMIDE) 1 tab by mouth daily TRAZODONE HCL 50 MG  TABS (TRAZODONE HCL) 1 tab by mouth q hs FLUTICASONE PROPIONATE 50 MCG/ACT SUSP (FLUTICASONE PROPIONATE) One spray in each nostril two times a day (hold head down) HYDRALAZINE HCL 25 MG TABS (HYDRALAZINE HCL) Take one tablet by mouth twice day VITAMIN D (ERGOCALCIFEROL) 50000 UNIT CAPS (ERGOCALCIFEROL) One capsule by mouth weekly LORATADINE 10 MG TABS (LORATADINE) One tablet by mouth daily for allergies SERTRALINE HCL 100 MG TABS (SERTRALINE HCL) One tablet by mouth daily nightly   SOCIAL HISTORY: The pt smokes tobacco socially on the weekends. She drinks 2-3 glasses of wine per day.   She pt is single and lives alone.  FAMILY HISTORY Father died of MI in his 12s Mother died of MI in her 66s asthma: father,sister cancer: sister (lung)    ROS: as per HPI, all other systems reviewed and negative   VITALS: T: 98.6  P: 88  BP: 188/70   R: 16  O2SAT:  94% ON: 2L  PHYSICAL EXAM: General:  alert, well-developed, NAD, cooperative, A&Ox3 Head:  normocephalic and atraumatic.   Eyes:  PERRLA, EOMI, vision grossly intact, conjuctive and sclerae within normal limits.   Mouth:  Dry MM and lips, no erythema, no exudates, or lesions.   Neck:  supple, full ROM, trachea midline, mild thyroid enlargement, no JVD, no  carotid bruits.   Lungs:  Reduced breath sounds at the bases, otherwise CTAB, no wheezes, normal respiratory effort.  +bibasilar crackles Heart: RRR, no MRG Abdomen:  soft, mildly tender throughout, ND, no ascites, BS present and normoactive, no palpable masses  Msk:  no joint swelling, warmth, or erythema  Neurologic:  CN II-XII intact,4/5 strength globally, sensation grossly intact.   Skin:  turgor normal and no rashes.   Ext: warm, dry.  +2 distal pulses.  No C/C/E. Psych: memory intact for  recent and remote, normally interactive, good eye contact, affect as expected   LABS: CKMB, POC                                <1.0       l      1.0-8.0          ng/mL  Troponin I, POC                          <0.05             0.00-0.09        ng/mL  Myoglobin, POC                           56.0              12-200           ng/mL   WBC                                      12.3       h      4.0-10.5         K/uL  RBC                                      4.23              3.87-5.11        MIL/uL  Hemoglobin (HGB)                         12.3              12.0-15.0        g/dL  Hematocrit (HCT)                         37.6              36.0-46.0        %  MCV                                      88.9              78.0-100.0       fL  MCH -                                    29.1  26.0-34.0        pg  MCHC                                     32.7              30.0-36.0        g/dL  RDW                                      15.4              11.5-15.5        %  Platelet Count (PLT)                     155               150-400          K/uL  Neutrophils, %                           77                43-77            %  Lymphocytes, %                           16                12-46            %  Monocytes, %                             7                 3-12             %  Eosinophils, %                           0                 0-5              %  Basophils, %                             0                 0-1              %  Neutrophils, Absolute                    9.5        h      1.7-7.7          K/uL  Lymphocytes, Absolute                    1.9               0.7-4.0          K/uL  Monocytes, Absolute  0.8               0.1-1.0          K/uL  Eosinophils, Absolute                    0.0               0.0-0.7          K/uL  Basophils, Absolute                      0.0               0.0-0.1          K/uL   Sodium (NA)                              139                135-145          mEq/L  Potassium (K)                            3.0        l      3.5-5.1          mEq/L  Chloride                                 103               96-112           mEq/L  CO2                                      26                19-32            mEq/L  Glucose                                  133        h      70-99            mg/dL  BUN                                      5          l      6-23             mg/dL  Creatinine                               0.72              0.4-1.2          mg/dL  GFR, Est Non African American            >60               >60  mL/min  GFR, Est African American                >60               >60              mL/min    Oversized comment, see footnote  1  Bilirubin, Total                         1.4        h      0.3-1.2          mg/dL  Alkaline Phosphatase                     68                39-117           U/L  SGOT (AST)                               45         h      0-37             U/L  SGPT (ALT)                               67         h      0-35             U/L  Total  Protein                           8.5        h      6.0-8.3          g/dL  Albumin-Blood                            4.1               3.5-5.2          g/dL  Calcium                                  9.3               8.4-10.5         mg/dL   Lipase                                   20                11-59            U/L   Beta Natriuretic Peptide                 1621.0     h      0.0-100.0        pg/mL  Blood cx: pending  CXR: Findings: The lung volumes are low.  Perihilar interstitial prominence is seen throughout both lungs.  There also patchy left greater than right basilar airspace opacities seen.  No pleural  effusions are seen.  The heart is enlarged but unchanged.  Left chest wall AICD is in place with leads intact.  The upper abdomen osseous structures are normal.    IMPRESSION  Findings compatible with mild pulmonary edema.   Nonspecific bibasilar opacities are concerning for infection or aspiration.  Acute abdomen series: Nonspecific, nonobstructive bowel gas pattern.  Right lower lobe airspace opacity which is nonspecific and could relate to atelectasis, infection or aspiration.  A PA and lateral view may be useful for further evaluation.  ASSESSMENT AND PLAN:  Ms Peppers is a 65 yo F with a h/o of ischemic cardiomyopathy and CHF who presents with worsening SOB and a heavy sensation in her chest, as well as diarrhea.  (1)  SOB The pt's history, exam, and CXR are suggestive of pulmonary edema secondary to CHF; BNP of 1621 elevated well above pts baseline.  The cause of the current exacerbation might include ischemic cardiomyopathy/ACS, which would be consistent with her sensations of chest heaviness.  It also could include an infection, which would be consistent with her leukocytosis and cough productive of yellow sputum.  Her smoking history is vague, but if she has been an occasional smoker for a long period of time, COPD may be contributing to her current symtpoms. - Admited to tele floor. - Lasix 80 mg BID - Cardiac enzymes will be cycled X3 - EKG: Includes pacer spikes, otherwise no acute abnormalities, no signs of ischemia.  Follow up with AM EKG. - Blood Cultures X 2 - Started Avelox for empiric coverage of community acquired pneumonia. - Ambulatory pulse Ox will be measured daily to evaluate for a need for home O2. - Albuterol as needed for SOB / wheezing. - Continue coreg and ace - consider addition of Imdur to HF regimen (easier dosing than hydralazine)  (2)  Chest pain The pt's chest discomfort will be worked up for ischemia as noted above.   - Morphine as needed for chest heaviness / pain - Check A1C and AM FLP for further risk stratification - TSH  (3)  Hypertension The pt did not take her BP medications today because she was feeling ill and decided to come to the ED instead.  She will be continued  on her home medications.  If her BP does not normalize, Imdur could be added. - Coreg 25 by mouth daily - Hydralazine 10 mg q6 as needed for SBP > 180 - Hydralazine 25 mg by mouth daily - Lisonopril 20 by mouth two times a day  (4)  Hypokalemia - KCl 40 mEq x 3 q4hr - check mag  (5)  Hypothyroidism - Check TSH level - Levothyroxine 112 micrograms by mouth daily  (6) Possible EtOH abuse - CIWA protocol - Check Mg level  (7)  Diarrhea The pt's GI symptoms are likely to resolve without intervention.  If they continue, further workup may be needed.   - Loperamide as needed  (8)DEPRESSION/ANXIETY: Continue home medications (Sertraline)  (9)VTE PROPH: Lovenox    Clinical Lists Changes

## 2010-08-04 ENCOUNTER — Encounter (INDEPENDENT_AMBULATORY_CARE_PROVIDER_SITE_OTHER): Payer: Self-pay | Admitting: Nurse Practitioner

## 2010-08-05 ENCOUNTER — Encounter (INDEPENDENT_AMBULATORY_CARE_PROVIDER_SITE_OTHER): Payer: Self-pay | Admitting: Nurse Practitioner

## 2010-08-05 ENCOUNTER — Encounter: Payer: Self-pay | Admitting: Nurse Practitioner

## 2010-08-08 ENCOUNTER — Telehealth: Payer: Self-pay | Admitting: Internal Medicine

## 2010-08-08 NOTE — Telephone Encounter (Signed)
Pt states her device keep making a noise. Pt would like to take to a nurse

## 2010-08-09 ENCOUNTER — Ambulatory Visit (INDEPENDENT_AMBULATORY_CARE_PROVIDER_SITE_OTHER): Payer: Medicare Other | Admitting: *Deleted

## 2010-08-09 DIAGNOSIS — I428 Other cardiomyopathies: Secondary | ICD-10-CM

## 2010-08-09 DIAGNOSIS — I509 Heart failure, unspecified: Secondary | ICD-10-CM

## 2010-08-09 NOTE — Miscellaneous (Signed)
Summary: Hospital update  Clinical Lists Changes  Medications: Changed medication from LEVOTHYROXINE SODIUM 112 MCG TABS (LEVOTHYROXINE SODIUM) One tablet by mouth daily for thyroid to LEVOTHYROXINE SODIUM 125 MCG TABS (LEVOTHYROXINE SODIUM) One tablet by mouth daily Removed medication of NAPROSYN 500 MG TABS (NAPROXEN) 1 tablet by mouth two times a day as needed for pain Removed medication of ULTRAM 50 MG TABS (TRAMADOL HCL) 1 tablet by mouth two times a day as needed for pain Removed medication of TRIAMCINOLONE ACETONIDE 0.1 % CREA (TRIAMCINOLONE ACETONIDE) 1 application topically to affected area Removed medication of PROVENTIL HFA 108 (90 BASE) MCG/ACT AERS (ALBUTEROL SULFATE) 2 puffs every 6 hours as needed for shortness of breath Removed medication of HYDRALAZINE HCL 25 MG TABS (HYDRALAZINE HCL) Take one tablet by mouth twice day Added new medication of ISOSORBIDE MONONITRATE CR 30 MG XR24H-TAB (ISOSORBIDE MONONITRATE) One tablet by mouth daily Removed medication of VOLTAREN 1 % GEL (DICLOFENAC SODIUM) One application topically to bilateral knees three times a day as needed Added new medication of LIPITOR 40 MG TABS (ATORVASTATIN CALCIUM) One tablet by mouth two times a day Removed medication of VITAMIN D (ERGOCALCIFEROL) 50000 UNIT CAPS (ERGOCALCIFEROL) One capsule by mouth weekly Added new medication of NITROSTAT 0.4 MG SUBL (NITROGLYCERIN) One under tongue every 5 mniutes as needed for chest pain up to 3 doses Added new medication of SIMETHICONE 80 MG CHEW (SIMETHICONE) ONe tablet by mouth three times a day as needed for gas Observations: Added new observation of TSH: 10.551 microintl units/mL (07/21/2010 12:43) Added new observation of T4, FREE: 0.87 ng/dL (07/21/2010 12:43) Added new observation of CHOL/HDL: 2.1  (07/21/2010 12:43) Added new observation of VLDL: 12 mg/dL (07/21/2010 12:43) Added new observation of LDL: 44 mg/dL (07/21/2010 12:43) Added new observation of HDL: 51  mg/dL (07/21/2010 12:43) Added new observation of TRIGLYC TOT: 62 mg/dL (07/21/2010 12:43) Added new observation of CHOLESTEROL: 107 mg/dL (07/21/2010 12:43) Added new observation of PLATELETK/UL: 164 K/uL (07/21/2010 12:43) Added new observation of RDW: 15.7 % (07/21/2010 12:43) Added new observation of MCHC RBC: 29.8 g/dL (07/21/2010 12:43) Added new observation of MCV: 90.9 fL (07/21/2010 12:43) Added new observation of HCT: 37.8 % (07/21/2010 12:43) Added new observation of HGB: 12.4 g/dL (07/21/2010 12:43) Added new observation of RBC M/UL: 4.16 M/uL (07/21/2010 12:43) Added new observation of WBC COUNT: 7.4 10*3/microliter (07/21/2010 12:43) Added new observation of CALCIUM: 9.0 mg/dL (07/21/2010 12:43) Added new observation of CREATININE: 1.07 mg/dL (07/21/2010 12:43) Added new observation of BUN: 13 mg/dL (07/21/2010 12:43) Added new observation of BG RANDOM: 94 mg/dL (07/21/2010 12:43) Added new observation of CO2 PLSM/SER: 30 meq/L (07/21/2010 12:43) Added new observation of CL SERUM: 100 meq/L (07/21/2010 12:43) Added new observation of K SERUM: 3.8 meq/L (07/21/2010 12:43) Added new observation of NA: 138 meq/L (07/21/2010 12:43)

## 2010-08-09 NOTE — Assessment & Plan Note (Signed)
Summary: Hospital F/u - CHF   Vital Signs:  Patient profile:   65 year old female Menstrual status:  postmenopausal Weight:      164.3 pounds BMI:     33.88 Temp:     97.8 degrees F oral Pulse rate:   58 / minute Pulse rhythm:   regular Resp:     16 per minute BP sitting:   171 / 73  (left arm) Cuff size:   regular  Vitals Entered By: Isla Pence (August 05, 2010 11:29 AM)  Nutrition Counseling: Patient's BMI is greater than 25 and therefore counseled on weight management options.  Serial Vital Signs/Assessments:  Time      Position  BP       Pulse  Resp  Temp     By 12:39 PM  R Arm     170/100                        Aurora Mask FNP 12:39 PM  L Arm     160/80                         Aurora Mask FNP  CC: hospital followup, Hypertension Management, CHF Management Is Patient Diabetic? No Pain Assessment Patient in pain? no       Does patient need assistance? Functional Status Self care Ambulation Normal   Primary Care Provider:  Aurora Mask FNP  CC:  hospital followup, Hypertension Management, and CHF Management.  History of Present Illness:  Pt into the office for f/u - hospitalized from 07/19/2010 to 07/22/2010 Full hospital d/c reviewed  1. Congestive heart failure exacerabation - cardiac enzymes negative.  Avelox started.  she was also diuresised with lasix and symptoms resolved.  BNP 219 on day of discharge. 2.  Nonischemic cardiomyopathy, status post implantable cardioverterdebribillator placement in 2008 3.  Hypertension - hydralazine d/c by cardiology in the hospital due to low BP.    4.  Hypothyroidism - medication increased to 125 (from 112) in the hospital 5.  Hepatitis C virus 6.  History of Hepatitis A virus and hepatitis B virus  Obesity - down 5 pounds since last visit  Cardiology - pt has an appt with cardiology on April   CHF History:      Daily weights are not being checked.  She does not understand fluid management and sodium  restriction and is not following the regimen.  The patient expresses lack of understanding of the treatment plan and her medications.  She admits to being compliant with her medications.  ADL's are not being done without symptoms or restrictions.  The patient has not been enrolled in the CHF Eduction Program.    Hypertension History:      She denies headache, chest pain, and palpitations.  She notes no problems with any antihypertensive medication side effects.  Pt is taking meds as ordered.        Positive major cardiovascular risk factors include female age 51 years old or older, hypertension, and family history for ischemic heart disease (females less than 63 years old & males less than 1 years old).  Negative major cardiovascular risk factors include non-tobacco-user status.        Positive history for target organ damage include cardiac end organ damage (either CHF or LVH).  Further assessment for target organ damage reveals no history of ASHD, stroke/TIA, peripheral vascular disease, renal insufficiency, or hypertensive retinopathy.  Allergies (verified): 1)  ! Percocet  Review of Systems General:  Denies fever. CV:  Denies chest pain or discomfort. Resp:  Denies cough. GI:  Denies abdominal pain, nausea, and vomiting. MS:  Denies joint pain.  Physical Exam  General:  alert.   Head:  normocephalic.   Eyes:  glasses Lungs:  normal breath sounds.   Heart:  normal rate and regular rhythm.   Abdomen:  normal bowel sounds.   Msk:  normal ROM.   Extremities:  no edema Neurologic:  alert & oriented X3.   Skin:  color normal.   Psych:  Oriented X3.     Impression & Recommendations:  Problem # 1:  CONGESTIVE HEART FAILURE (ICD-428.0) stable - advised pt to start weighing herself pt is taking meds Her updated medication list for this problem includes:    Lisinopril 20 Mg Tabs (Lisinopril) .[name]... Take 2 tablets by mouth daily for blood pressure    Coreg 25 Mg Tabs (Carvedilol)  .[name]... 1 tablet by mouth two times a day    Aspirin 81 Mg Tabs (Aspirin) .[name]... Take 1 tablet by mouth once a day    Furosemide 40 Mg Tabs (Furosemide) .[name]... 1 tab by mouth daily  Problem # 2:  ANXIETY (ICD-300.00) stable Her updated medication list for this problem includes:    Trazodone Hcl 50 Mg Tabs (Trazodone hcl) .[name]... 1 tab by mouth q hs    Sertraline Hcl 100 Mg Tabs (Sertraline hcl) ..... One tablet by mouth daily nightly  Problem # 3:  HYPOTHYROIDISM NOS (ICD-244.9) will recheck in 6-8 weeks since labs were just adjusted during recent hospitalization Her updated medication list for this problem includes:    Levothyroxine Sodium 125 Mcg Tabs (Levothyroxine sodium) ..... One tablet by mouth daily  Problem # 4:  IMPLANTATION OF DEFIBRILLATOR, HX OF (ICD-V45.02) pt advised to f/u with cardiology  Problem # 5:  HYPERTENSION, BENIGN (ICD-401.1) BP elevated today she may need to restart hydralazine - it was d/c while in the hospital due to hypotension Her updated medication list for this problem includes:    Lisinopril 20 Mg Tabs (Lisinopril) .[name]... Take 2 tablets by mouth daily for blood pressure    Coreg 25 Mg Tabs (Carvedilol) .[name]... 1 tablet by mouth two times a day    Furosemide 40 Mg Tabs (Furosemide) .[name]... 1 tab by mouth daily  Complete Medication List: 1)  Lisinopril 20 Mg Tabs (Lisinopril) .... Take 2 tablets by mouth daily for blood pressure 2)  Coreg 25 Mg Tabs (Carvedilol) .[name].. 1 tablet by mouth two times a day 3)  Levothyroxine Sodium 125 Mcg Tabs (Levothyroxine sodium) .... One tablet by mouth daily 4)  Ventolin Hfa 108 (90 Base) Mcg/act Aers (Albuterol sulfate) .... Inhale when needed 5)  Aspirin 81 Mg Tabs (Aspirin) .... Take 1 tablet by mouth once a day 6)  Furosemide 40 Mg Tabs (Furosemide) .[name].. 1 tab by mouth daily 7)  Trazodone Hcl 50 Mg Tabs (Trazodone hcl) .[name].. 1 tab by mouth q hs 8)  Fluticasone Propionate 50 Mcg/act Susp (Fluticasone propionate) .... One spray  in each nostril two times a day (hold head down) 9)  Loratadine 10 Mg Tabs (Loratadine) .... One tablet by mouth daily for allergies 10)  Sertraline Hcl 100 Mg Tabs (Sertraline hcl) .... One tablet by mouth daily nightly 11)  Isosorbide Mononitrate Cr 30 Mg Xr24h-tab (Isosorbide mononitrate) .... One tablet by mouth daily 12)  Lipitor 40 Mg Tabs (Atorvastatin calcium) .... One tablet by  mouth two times a day 13)  Nitrostat 0.4 Mg Subl (Nitroglycerin) .... One under tongue every 5 mniutes as needed for chest pain up to 3 doses 14)  Simethicone 80 Mg Chew (Simethicone) .... One tablet by mouth three times a day as needed for gas  CHF Assessment/Plan:      The patient's current weight is 164.3 pounds.  Her previous weight was 169.9 pounds.  Her last cardiac ejection fraction was 35 % on 07/22/2010.    Prior Cardiac Test Results:  Echocardiogram Report:    Date:     07/22/2010    Results:   normal left ventricular size, moderate systolic dysfunction with an EF of 30-35% in global hypokinesis.  Mild left ventricular hypertrophy, mild aortic insufficiency, mild mitral regurgitation, mild pulmonary hypertension, and normal right ventricular and systolic function.  no evidence of pericaridal effusion   Hypertension Assessment/Plan:      The patient's hypertensive risk group is category C: Target organ damage and/or diabetes.  Her calculated 10 year risk of coronary heart disease is 11 %.  Today's blood pressure is 171/73.  Her blood pressure goal is < 140/90.  Patient Instructions: 1)  Schedule an appointment for a thyroid labs - tsh in 6-8 weeks 2)  Your warning sign for Congestive Heart Failure may be increasing in fatigue. 3)  You need to start weighing yourself daily and monitor.  This can be your judge for increasing fluid. 4)  You need to call cardiology and let them know that you have been in the hospital and that your medications have been changed.  You should let them know that your  Defibulator is making a noise 5)  It has been a pleasure being your healthcare provider.     Orders Added: 1)  Est. Patient Level IV [99214]     X-ray  Procedure date:  07/19/2010  Findings:      nonspecific nonobstructive bowel gas pattern and a right lower lobe airspace opacity consistent with atelectasis infecting her respiration  CXR  Procedure date:  07/19/2010  Findings:      mild pulmonary edema as well as nonspecific bibasilar opacities concerning for infection or aspiration  EKG  Procedure date:  07/19/2010  Findings:      showed a ventricular paced rhythm at 86 beats per minute   X-ray  Procedure date:  07/19/2010  Findings:      nonspecific nonobstructive bowel gas pattern and a right lower lobe airspace opacity consistent with atelectasis infecting her respiration  CXR  Procedure date:  07/19/2010  Findings:      mild pulmonary edema as well as nonspecific bibasilar opacities concerning for infection or aspiration  EKG  Procedure date:  07/19/2010  Findings:      showed a ventricular paced rhythm at 86 beats per minute

## 2010-08-11 ENCOUNTER — Encounter: Payer: Self-pay | Admitting: Internal Medicine

## 2010-08-15 ENCOUNTER — Encounter: Payer: Self-pay | Admitting: *Deleted

## 2010-08-15 ENCOUNTER — Ambulatory Visit (INDEPENDENT_AMBULATORY_CARE_PROVIDER_SITE_OTHER): Payer: Medicare Other | Admitting: Internal Medicine

## 2010-08-15 ENCOUNTER — Encounter: Payer: Self-pay | Admitting: Internal Medicine

## 2010-08-15 DIAGNOSIS — Z95810 Presence of automatic (implantable) cardiac defibrillator: Secondary | ICD-10-CM

## 2010-08-15 DIAGNOSIS — I509 Heart failure, unspecified: Secondary | ICD-10-CM

## 2010-08-15 DIAGNOSIS — I428 Other cardiomyopathies: Secondary | ICD-10-CM

## 2010-08-15 DIAGNOSIS — G4733 Obstructive sleep apnea (adult) (pediatric): Secondary | ICD-10-CM

## 2010-08-15 DIAGNOSIS — M542 Cervicalgia: Secondary | ICD-10-CM

## 2010-08-15 DIAGNOSIS — I420 Dilated cardiomyopathy: Secondary | ICD-10-CM

## 2010-08-15 LAB — BASIC METABOLIC PANEL
BUN: 19 mg/dL (ref 6–23)
Calcium: 9.6 mg/dL (ref 8.4–10.5)
Creatinine, Ser: 1.1 mg/dL (ref 0.4–1.2)
GFR: 63.28 mL/min (ref >60.00)

## 2010-08-15 LAB — CBC WITH DIFFERENTIAL/PLATELET
Basophils Absolute: 0 10*3/uL (ref 0.0–0.1)
Basophils Relative: 0.4 % (ref 0.0–3.0)
Eosinophils Absolute: 0.2 10*3/uL (ref 0.0–0.7)
HCT: 44.1 % (ref 36.0–46.0)
Hemoglobin: 14.5 g/dL (ref 12.0–15.0)
Lymphocytes Relative: 39.8 % (ref 12.0–46.0)
Lymphs Abs: 2.5 10*3/uL (ref 0.7–4.0)
MCHC: 32.8 g/dL (ref 30.0–36.0)
MCV: 90.9 fl (ref 78.0–100.0)
Monocytes Absolute: 0.7 10*3/uL (ref 0.1–1.0)
Neutro Abs: 2.8 10*3/uL (ref 1.4–7.7)
RDW: 15.3 % — ABNORMAL HIGH (ref 11.5–14.6)

## 2010-08-15 LAB — PROTIME-INR
INR: 1.2 ratio — ABNORMAL HIGH (ref 0.8–1.0)
Prothrombin Time: 12.9 s — ABNORMAL HIGH (ref 10.2–12.4)

## 2010-08-15 MED ORDER — SPIRONOLACTONE 25 MG PO TABS
25.0000 mg | ORAL_TABLET | Freq: Every day | ORAL | Status: DC
Start: 2010-08-15 — End: 2011-09-05

## 2010-08-15 NOTE — Assessment & Plan Note (Signed)
The patient has significant sleep apnea by history. A mask was tried a number of years ago without success. We will refer her back to pulmonary for support

## 2010-08-15 NOTE — Assessment & Plan Note (Signed)
We will begin her on spironolactone. We will have to keep an eye  on her blood pressure. Her hydralazine was discontinued for this reason. For right now we will continue her on nitrates and consider addition of hydralazine later

## 2010-08-15 NOTE — Progress Notes (Signed)
HPI  Morgan Clay is a 65 y.o. female Is seen in followup for congestive heart failure as stated nonischemic cardio myopathy for which he underwent CRT implantation August 2008. Over the subsequent year 2009 she had normalization of ventricular systolic function. However, following hospitalization in March 2012 for chest pain and shortness of breath repeat ultrasonography demonstrated an ejection fraction of 25-30%. She ruled out for myocardial infarction was treated with diuresis. She is seen today as her device has reached ERI which she reacheed exactly in Sept  3. Left bundle-branch block. Past Medical History  Diagnosis Date  . LBBB (left bundle branch block)   . COPD (chronic obstructive pulmonary disease)   . Essential hypertension, benign   . Automatic implantable cardiac defibrillator in situ 0000000    Concerto ICD 0000000  . Anxiety   . Acute sinusitis   . Hepatitis C   . CHF (congestive heart failure)   . Hypothyroidism   . Hyperthyroidism   . Personal history of other infectious and parasitic disease     Hepatitis B    Past Surgical History  Procedure Date  . Appendectomy   . Cardiac catheterization   . [id number]     Concerto 0000000  . Insert / replace / remove pacemaker     concerto    Current Outpatient Prescriptions  Medication Sig Dispense Refill  . Albuterol Sulfate (VENTOLIN HFA IN) Inhale into the lungs as directed.        [name] aspirin 81 MG tablet Take 81 mg by mouth daily.        [name] atorvastatin (LIPITOR) 40 MG tablet Take 40 mg by mouth daily.        . carvedilol (COREG) 25 MG tablet Take 25 mg by mouth 2 (two) times daily with a meal.        . fluticasone (FLONASE) 50 MCG/ACT nasal spray 2 sprays by Nasal route 2 (two) times daily.        . furosemide (LASIX) 40 MG tablet Take 40 mg by mouth daily.        . isosorbide mononitrate (IMDUR) 30 MG 24 hr tablet Take 30 mg by mouth daily.        [name] levothyroxine (SYNTHROID, LEVOTHROID) 125 MCG tablet Take  125 mcg by mouth daily before breakfast.        . lisinopril (PRINIVIL,ZESTRIL) 20 MG tablet Take 20 mg by mouth 2 (two) times daily.        [name] loratadine (CLARITIN) 10 MG tablet Take 10 mg by mouth daily.        . nitroGLYCERIN (NITROSTAT) 0.4 MG SL tablet Place 0.4 mg under the tongue every 5 (five) minutes as needed.        . sertraline (ZOLOFT) 100 MG tablet Take 100 mg by mouth at bedtime.        . simethicone (MYLICON) 80 MG chewable tablet Chew 80 mg by mouth 3 (three) times daily.        . traZODone (DESYREL) 50 MG tablet Take 50 mg by mouth at bedtime.          Allergies  Allergen Reactions  . Oxycodone-Acetaminophen     Review of Systems negative except from HPI and PMH  Physical Exam Well developed and well nourished in no acute distress HENT normal E scleral and icterus clear Neck Supple JVP 8 To 9 cm carotids brisk and full Clear to ausculation Device pocket is well healed with just a little bit of atrophy Regular  rate and rhythm, no murmurs gallops or rub Soft with active bowel sounds No clubbing cyanosis and edema Alert and oriented, grossly normal motor and sensory function Skin Warm and Dry  ECG  p synchrnous pacing  Assessment and  Plan

## 2010-08-15 NOTE — Patient Instructions (Addendum)
Your physician has recommended that you have an ICD generator change.  Please see the instruction sheet given to you today for more information. Your physician recommends that you return for lab work today in anticipation of generator change on Thursday.  You have been referred to Dr. Halford Chessman or Elsworth Soho for sleep apnea, patient has had sleep test done previously.

## 2010-08-15 NOTE — Assessment & Plan Note (Signed)
Patient's device reached ERI in September. I don't know whether there is any explanation for her device point of view Why she should be feeling so much worse and why it is that she has lost left ventricular function. We have reviewed the benefits and risks of generator replacement.  These include but are not limited to lead fracture and infection.  The patient understands, agrees and is willing to proceed.

## 2010-08-15 NOTE — Assessment & Plan Note (Addendum)
Continue current medications; will addAldactone.

## 2010-08-16 ENCOUNTER — Telehealth: Payer: Self-pay | Admitting: Internal Medicine

## 2010-08-16 NOTE — Telephone Encounter (Signed)
PT AWARE OF LAB RESULTS./CY 

## 2010-08-17 NOTE — Discharge Summary (Signed)
Morgan Clay, Morgan Clay           ACCOUNT NO.:  [id number]  MEDICAL RECORD NO.:  [id number]           PATIENT TYPE:  I  LOCATION:  [id number]                         FACILITY:  [facility]  PHYSICIAN:  Desiree Hane, MD     DATE OF BIRTH:  13-Apr-1946  DATE OF ADMISSION:  07/19/2010 DATE OF DISCHARGE:  07/22/2010                              DISCHARGE SUMMARY   DISCHARGE DIAGNOSES: 1. Congestive heart failure exacerbation. 2. Nonischemic cardiomyopathy, status post implantable cardioverter-     defibrillator placement in 2008. 3. Hypertension. 4. Hypothyroidism, status post radioiodine ablation. 5. Hepatitis C virus (origin unknown may be related to jaundice post     child birth). 6. History of hepatitis A virus, hepatitis B virus.  DISCHARGE MEDICATIONS: 1. Carvedilol 25 mg p.o. b.i.d. 2. Isosorbide mononitrate 30 mg p.o. daily. 3. Levothyroxine 125 mcg by mouth daily before breakfast. 4. Lisinopril 20 mg p.o. b.i.d. 5. Nitroglycerin 0.4 mg under tongue q.5 minutes p.r.n. for chest pain     up to 3 doses. 6. Sertraline 100 mg p.o. at bedtime. 7. Simethicone 80 mg p.o. t.i.d. p.r.n. for abdominal pain related     gas. 8. Trazodone 50 mg p.o. at bedtime. 9. Furosemide 40 mg p.o. daily. 10.Aspirin 81 mg p.o. daily. 11.Lipitor 40 mg p.o. b.i.d.  DISPOSITION AND FOLLOWUP:  The patient was discharged in improved condition; however, she continues to have significant limitations on ability to perform rigorous tasks.  Therefore, it is recommended that she return to work not before August 01, 2010.  She will follow up with Dr. Aurora Mask at [facility] on August 05, 2010.  At this time her TSH should be checked to assess the adequacy of her new levothyroxine dose.  She will also need review of her current medications and possible adjustment of her Lasix dose to control her pulmonary and peripheral edema. She may also benefit from referral to Cardiology.  PROCEDURES: 1. Abdominal x-ray  on July 19, 2010, which showed nonspecific,     nonobstructive bowel gas pattern and a right lower lobe airspace     opacity consistent with atelectasis infecting her respiration. 2. Chest x-ray PA and lateral, which showed findings compatible with     mild pulmonary edema as well as nonspecific bibasilar opacities     concerning for infection or aspiration. 3. EKG on July 19, 2010, which showed a ventricular paced rhythm at     86 beats per minute. 4. EKG on July 20, 2010, which showed no significant change from the     previous EKG, there was some questionable changes in T-wave     duration, however.  The interpretation of this is compounded by the     ventricular pacing and the left bundle-branch block. 5. Echocardiogram on July 22, 2010, which showed normal left     ventricular size, moderate systolic dysfunction with an EF of 30-     35% in global hypokinesis.  Mild left ventricular hypertrophy, mild     aortic insufficiency, mild mitral regurgitation, mild pulmonary     hypertension, and normal right ventricular and systolic function.     There was no evidence  of pericardial effusion.  CONSULTATIONS:  Cardiology (Dr. Martinique on July 21, 2010).  ADMITTING HISTORY AND PHYSICAL:  HISTORY OF PRESENT ILLNESS:  Morgan Clay is a 65 year old female with a history of nonischemic cardiomyopathy and CHF, status post an ICD placement in 2008  who presents with worsening shortness of breath and heavy sensation in her chest.  She also complains acutely of diarrhea in the left lower quadrant in the last 24 hours and this is what brought her to the ED.  The shortness of breath and chest discomfort have been waxing and waning over the past month or so.  It is exacerbated by activity and relived by rest.  It is also partially relieved by the patient's albuterol inhaler.  It is not associated with chest pain, arm pain, or numbness, neck pain, the palpitations or presyncope.  She endorses  cough productive of yellow sputum and rhinorrhea, but denies fever or sick contacts.  These symptoms have restricted her ability to perform her ADLs.  Regarding her GI symptoms, she developed left lower quadrant pain last night and then have few loose brown stools.  She denies blood in the stools, melena, or contacts with recent diarrhea. Incidentally, she reports recent dry mouth.  ADMITTING PHYSICAL EXAMINATION:  VITAL SIGNS:  Temperature 98.6, pulse 88, blood pressure 188/70, respiratory rate 16, O2 saturation 94% on 2 L of oxygen. GENERAL:  Awake and alert, in no acute distress. EYES:  PERRLA, EOMI.  Conjunctivae clear.  Sclerae clear. MOUTH:  Dry mucous membranes and lips, no erythema, no exudates, no lesions. NECK:  Full range of motion, trachea midline.  Mild thyroid enlargement. No JVD.  No carotid bruits. LUNGS:  Reduced breath sounds at the bases with bibasilar crackles, otherwise clear to auscultation bilaterally.  No wheezes, normal respiratory effort. HEART:  Regular rate and rhythm.  No murmurs, rubs, or gallops. ABDOMEN:  Soft, mildly tender throughout, nondistended, no ascites. Bowel sounds present and normoactive.  No palpable masses. NEUROLOGIC:  Cranial nerves II through XII intact.  Strength 4/5 bilaterally in the upper and lower extremities.  Sensation grossly intact and symmetric. EXTREMITIES:  Warm, dry, 2+ distal pulses in the upper and lower extremities.  No edema.  ADMITTING LABORATORY DATA:  Point-of-care cardiac enzymes are negative. White blood cells 12.3, hemoglobin 12.3, hematocrit 37.6, platelets 155. Sodium 139, potassium 3.0, chloride 103, bicarb 26, BUN 5, creatinine 0.72, glucose 133, lipase 20, BNP 1621 (high), magnesium 2.0.  UA: Specific gravity 1.013, protein 30, trace blood otherwise negative.  TSH 10.551.  HOSPITAL COURSE BY PROBLEM: 1. CHF exacerbation.  The patient was admitted to tele floor and     diuresed with Lasix with improvement  of her peripheral edema and resolution of      her pulmonary crackles and shortness of breath.  Cardiac enzymes were cycled and were negative     x3.  Her Lasix dose     was gradually reduced to 40 mg daily, and she was discharged on     this medication.  An echocardiogram was obtained and as noted     showed a worsened EF of 30-35% from her previous study (she had been found to     have an ejection fraction of 60% in September 2009).  On the day     before discharge, her BNP was remeasured and improved at 219. 2. Nonischemic cardiomyopathy.  The patient was complaining of a heavy     sensation in her chest on admission.  The next  morning, she was     describing this heavy sensation more and more as pain, and EKG was     obtained and cardiac enzymes were cycled again because her EKG     showed some T-wave changes and clinically she was displaying     symptoms of acute coronary syndrome.  She was treated with morphine     and nitroglycerin as well as aspirin.  Later in the morning, she     complained of 8/10 chest pain and because of her clinical picture,     she was started on heparin and Cardiology was consulted.  However,     given that her EKG changes are difficult to interpret in the     context of left bundle-branch block and intracardiac pacing as well     as the fact that the cardiac catheterization in 2006 showed no     obstruction leading to ischemic cardiomyopathy.  It was decided     that her chest pain was not due to ischemia and therefore heparin     was stopped.  As her CHF exacerbation was treated via diuresis and     her hypertension was better controlled, she complained of this     chest heaviness much less.  In the meantime, it was treated with     morphine p.r.n. 3. Hypertension.  When the patient was admitted, she was hypertensive.     On the day of admission, she had not been taking her blood pressure     medications, which include lisinopril, Coreg, and hydralazine.   She     was restarted on these medications as well as isosorbide     mononitrate.  Her blood pressure was well controlled on these     medications in the context of Lasix diuresis.  In fact her     hydralazine was discontinued by cardiology, given low blood     pressure on the day that she was seen.  This was continued to be     held after the stage and her blood pressure was well controlled up     to the day of discharge.  On the day of discharge, her systolic     blood pressure was moderately high again at 173/80.  This will need     to be followed up in the outpatient clinic. 4. Hypothyroidism.  The patient initially reported that she was taking     125 mcg of levothyroxine daily; however, her previous records     indicated that she was on 112 mcg per day.  Therefore, she was     started on 112 mcg per day and her TSH was checked, this was found     to be elevated as previously noted.  Therefore, she was increased     to 125 mcg per day.  This may need to be adjusted further on an     outpatient basis.  DISCHARGE VITALS:  Temperature 97.8, pulse 67, respirations 20, blood pressure 173/80, oxygen saturation 97% on room air.  DISCHARGE LABS:  Sodium 138, potassium 3.8, chloride 100, bicarb 30, BUN 13, creatinine 1.07, glucose 94.     Eduardo Osier, DO   ______________________________ Desiree Hane, MD    KM/MEDQ  D:  07/28/2010  T:  07/29/2010  Job:  [id number]  cc:   Dr. Aurora Mask  Electronically Signed by Eduardo Osier  on 08/07/2010 08:25:41 PM Electronically Signed by Desiree Hane  on 08/17/2010 12:07:59 PM

## 2010-08-18 ENCOUNTER — Ambulatory Visit (HOSPITAL_COMMUNITY)
Admission: RE | Admit: 2010-08-18 | Discharge: 2010-08-18 | Disposition: A | Discharge status: Home / Self Care | Payer: Medicare Other | Source: Ambulatory Visit | Attending: Internal Medicine | Admitting: Internal Medicine

## 2010-08-18 DIAGNOSIS — Z4502 Encounter for adjustment and management of automatic implantable cardiac defibrillator: Secondary | ICD-10-CM | POA: Insufficient documentation

## 2010-08-18 DIAGNOSIS — I509 Heart failure, unspecified: Secondary | ICD-10-CM | POA: Insufficient documentation

## 2010-08-18 DIAGNOSIS — I428 Other cardiomyopathies: Secondary | ICD-10-CM

## 2010-08-22 ENCOUNTER — Other Ambulatory Visit: Payer: Self-pay | Admitting: Internal Medicine

## 2010-08-23 ENCOUNTER — Encounter: Payer: Self-pay | Admitting: Internal Medicine

## 2010-08-31 ENCOUNTER — Ambulatory Visit (INDEPENDENT_AMBULATORY_CARE_PROVIDER_SITE_OTHER): Payer: Medicare Other | Admitting: *Deleted

## 2010-08-31 ENCOUNTER — Encounter: Payer: Self-pay | Admitting: Pulmonary Disease

## 2010-08-31 DIAGNOSIS — I428 Other cardiomyopathies: Secondary | ICD-10-CM

## 2010-08-31 NOTE — Patient Instructions (Signed)
You may wash the area with soap and water. No lotion or oils to incision. Follow up with Dr. Caryl Comes 7/17 @ 9:15am.

## 2010-08-31 NOTE — Op Note (Signed)
Morgan Clay, Morgan Clay NO.:  [id number]  MEDICAL RECORD NO.:  [id number]           PATIENT TYPE:  O  LOCATION:  [facility]                         FACILITY:  [facility]  PHYSICIAN:  [name], MD, FACCDATE OF BIRTH:  1945/06/07  DATE OF PROCEDURE:  08/18/2010 DATE OF DISCHARGE:  08/18/2010                              OPERATIVE REPORT   PREOPERATIVE DIAGNOSES: 1. Congestive heart failure. 2. Cardiomyopathy. 3. Previously implanted cardiac resynchronization therapy     defibrillator, now at elective replacement indicator (way past).  POSTOPERATIVE DIAGNOSES: 1. Congestive heart failure. 2. Cardiomyopathy. 3. Previously implanted cardiac resynchronization therapy     defibrillator, now at elective replacement indicator (way past).  PROCEDURES:  Defibrillator explantation, defibrillator implantation, and intraoperative defibrillation threshold testing.  Following obtaining informed consent, the patient was brought to the electrophysiology laboratory and placed on the fluoroscopic table in supine position.  After routine prep and drape of the left upper chest, lidocaine was infiltrated just below the line of the previous incision and carried down to layer of device pocket using sharp dissection with electrocautery.  The pocket was opened.  The device was explanted.  The leads were then freed up from the floor, and the anterior aspect of the pocket was removed.  Interrogation of the previously implanted Medtronic 5076 lead, serial number [id number] demonstrated an amplitude of 6.3 with a pace impedance of 497 and threshold 0.6 at 0.5, current of threshold 1.1 mA and there was no diaphragmatic pacing at 10 volts.  The previously implanted ventricular 6947 lead, serial number [id number] demonstrated an amplitude of 30 with pace impedance of 526, a threshold 0.6 at 0.5, current threshold was 1.3 mA.  The previously implanted Medtronic 4193 lead, serial number  [id number] in a LV tip RV coil configuration demonstrated an impedance of 474, threshold 0.7 at 0.5, current threshold 1.7 mA.  With these acceptable parameters recorded, the leads were attached to a Medtronic D3140TRG defibrillator, serial number [id number].  Through the device the bipolar R-wave was 20 with a pacing impedance of 456, a threshold 0.75 at 0.4, the P-wave was 4.1 with a pace impedance of 456 with threshold of 0.75 at 0.4, and the LV impedance was 399, a threshold 0.7 at 5.4.  Proximal coil impedance was 55, distal coil impedance was 46.  With these acceptable parameters recorded, the leads and the pulse generator were secured to the prepectoral fascia with the pocket having been copiously irrigated with antibiotic containing saline solution and hemostasis having been obtained.  The wound was then closed in three layers in a normal fashion following defibrillator threshold testing.  Ventricular fibrillation was induced via the T-wave shock after total duration of 8 seconds and 20- joule shock was delivered through a measured resistance of 45 ohms terminating ventricular fibrillation and restoring sinus rhythm.  Following closure, the wound was washed and Benzoin and Steri-Strip dressing was applied.  Needle counts, sponge counts, and instrument counts were correct at the end of the procedure according to staff.  The patient tolerated the procedure without apparent complications.     [name], MD, [facility]     SCK/MEDQ  D:  08/18/2010  T:  08/19/2010  Job:  [id number]  Electronically Signed by Virl Axe MD [place] on 08/31/2010 08:50:29 AM

## 2010-09-02 ENCOUNTER — Ambulatory Visit: Payer: Medicare Other | Admitting: Pulmonary Disease

## 2010-09-19 ENCOUNTER — Ambulatory Visit (INDEPENDENT_AMBULATORY_CARE_PROVIDER_SITE_OTHER): Payer: Medicare Other | Admitting: Pulmonary Disease

## 2010-09-19 ENCOUNTER — Encounter: Payer: Self-pay | Admitting: Pulmonary Disease

## 2010-09-19 VITALS — BP 132/88 | HR 69 | Temp 98.2°F | Ht 59.0 in | Wt 162.2 lb

## 2010-09-19 DIAGNOSIS — G4733 Obstructive sleep apnea (adult) (pediatric): Secondary | ICD-10-CM

## 2010-09-19 NOTE — Patient Instructions (Signed)
Will schedule for home sleep testing to see if your prior mild sleep apnea has worsened.  Will call with results. Work on weight reduction.

## 2010-09-19 NOTE — Assessment & Plan Note (Signed)
The pt has a history of mild osa in the past, but did not see a big difference in her symptoms with cpap.  This is suggestive her symptoms at the time were not due to SDB.  She had a recent bout with CHF, and was told she had loud snoring while in the hospital.  The pt feels she sleeps well, denies nonrestorative sleep, and denies EDS.  Her weight is stable from her last visit in 2009.  At this point, we want to know if her sleep apnea has worsened, thus being more of a risk factor for her CHF.  She is willing to have a home sleep study, and will check this.

## 2010-09-19 NOTE — Progress Notes (Signed)
  Subjective:    Patient ID: Morgan Clay, female    DOB: 31-May-1945, 65 y.o.   MRN: [id number]  HPI The pt comes in today for f/u of her prior h/o mild osa.  She tried cpap, and never came back for her f/u visits.  She tells me she wore for 6-20mos, and really did not see an improvement in her sleep or daytime alertness.  She quit using, and has not tried again.  She was recently in hospital where she was noted to have loud snoring, and the question was raised whether her sleep apnea had worsened.  Her weight is stable since her last visit here.    Review of Systems  Constitutional: Negative for fever and unexpected weight change.  HENT: Positive for ear pain. Negative for nosebleeds, congestion, sore throat, rhinorrhea, sneezing, trouble swallowing, dental problem, postnasal drip and sinus pressure.   Eyes: Negative for redness and itching.  Respiratory: Negative for cough, chest tightness, shortness of breath and wheezing.   Cardiovascular: Negative for palpitations and leg swelling.  Gastrointestinal: Negative for nausea and vomiting.  Genitourinary: Negative for dysuria.  Musculoskeletal: Negative for joint swelling.  Skin: Negative for rash.  Neurological: Negative for headaches.  Hematological: Does not bruise/bleed easily.  Psychiatric/Behavioral: Positive for dysphoric mood. The patient is nervous/anxious.        Objective:   Physical Exam Obese female in nad Chest clear Cor with rrr LE without significant edema, no cyanosis  Alert, does not appear sleepy, moves all 4        Assessment & Plan:

## 2010-09-20 NOTE — Assessment & Plan Note (Signed)
[facility]                          CHRONIC HEART FAILURE NOTE   Morgan Clay, NOON                    MRN:          [id number]  DATE:09/03/2007                            DOB:          1945-12-31    PRIMARY CARDIOLOGIST:  Minus Breeding, MD, [facility]   PRIMARY CARE:  [facility]   ELECTROPHYSIOLOGIST:  [name], MD, [facility]   Jynesis returns today for follow up of her congestive heart failure  which is secondary to nonischemic cardiomyopathy, most likely due to  poorly controlled hypertension in the past.  When I last saw Morgan Clay  in March, I had arranged for her to have a sleep apnea link with  overnight pulse ox because she has complained of increased insomnia,  difficulty falling and staying asleep.  This test has not been done as  of yet.  Morgan Clay continues to complain of insomnia.  She denies any  orthopnea or PND.  She also complains of lower back pain that limits her  position at night.  This could be contributing to her symptoms also.  Morgan Clay has been working on trying to lose weight over the last few  months.  She started a walking program, and also has been volunteering 2  days a week at the [employer] on [address].  She is  feeling better.  She has a lot of financial stress in her life.  This  could also be contributing to her insomnia.  She states her energy  level, however, is returning.  She denies any firing from her  defibrillator, presyncope or syncopal episodes.   PAST MEDICAL HISTORY:  1. Congestive heart failure secondary to nonischemic cardiomyopathy in      the setting of poorly controlled hypertension in the past with EF      previously 15%; now 60%.  2. Status post CTX test in 2008 showing submaximal aerobic effort.      Exercise testing with gas exchange demonstrated mild functional      limitations due primarily to circulatory limitation.  3. Remote history EtOH  abuse.  4. Situational depression.  5. Insomnia with unclear etiology.  6. Status post implantation of a CRT device followed by Dr. Jolyn Nap.  The patient has a Medtronic Concerta device.  7. Left bundle branch block.  8. Hypertension.  9. COPD.  10.Treated hypothyroidism.  11.Status post cardiac catheterization showing normal coronaries with      moderately to severely elevated pulmonary pressures.  12.History of tobacco use.   REVIEW OF SYSTEMS:  As stated above.  Otherwise negative.   CURRENT MEDICATIONS:  1. Carvedilol 25 mg b.i.d.  2. KCl 20 mEq b.i.d.  3. Aspirin 81 mg daily.  4. Lisinopril 40 mg daily.  5. Levothyroxine 100 mcg daily.  6. Isosorbide 60 mg daily.  7. Zoloft 100 mg daily.  8. Lasix 40 mg daily.  9. Hydralazine 50 mg t.i.d.   PHYSICAL EXAMINATION:  VITAL SIGNS:  Weight 159 (weight is down 3  pounds), blood pressure 149/73 with heart rate of 64.  GENERAL:  [name] is in no acute distress at this time.  NECK:  No signs of jugular vein distention at 45 degree angle.  LUNGS:  Clear to auscultation bilaterally.  ABDOMEN:  Soft, nontender, positive bowel sounds.  LOWER EXTREMITIES:  Without clubbing, cyanosis or edema.  NEUROLOGICALLY:  Alert and oriented x3.   IMPRESSION:  Congestive heart failure secondary to nonischemic  cardiomyopathy, still with blood pressure suboptimal with hydralazine  bumped to 50 mg t.i.d.  Will increase lisinopril to 40 mg b.i.d. and  have Morgan Clay return next week for blood work, and then she will need a  follow-up appointment with Dr. Percival Spanish, routine cardiology visit.  Also, will make sure that her sleep study is scheduled.      [name], ACNP  Electronically Signed      Minus Breeding, MD, [facility]  Electronically Signed   MB/MedQ  DD: 09/03/2007  DT: 09/03/2007  Job #: [id number]

## 2010-09-20 NOTE — Assessment & Plan Note (Signed)
[facility]                          CHRONIC HEART FAILURE NOTE   LUKISHA, STRATMAN                    MRN:          [id number]  DATE:12/04/2006                            DOB:          01-09-1946    Ms. Harlan returns today for followup of her congestive heart failure  which is secondary to nonischemic cardiomyopathy most likely due to  poorly controlled hypertension in the past. Most recent echocardiogram  done in June of this year showed an improvement in ejection fraction  from 15% to 25% to 30%. Ms. Heuser however continues to have class 3  New York Heart Association symptoms without volume overload. She has  abstained from ETOH and tobacco use times 1 month. Activity is very  limited. She can walk approximately 150 feet without becoming short of  breath and weak. There are days that she has to stop 1 to 2 times for  shortness of breath. She gauges her distance from her front door of her  apartment to her dumpster. This is the extent of activity for her at  this time. She denies any pre syncope or syncopal episodes, orthopnea,  or PND. She states that she saw Dr. Jobe Igo recently and her thyroid  medication was adjusted again from 75 to 100 mcg daily. She complains of  ongoing fatigue, insomnia, shortness of breath, generalized weakness  with minimal exertion.   PAST MEDICAL HISTORY:  Includes;  1. Congestive heart failure secondary to nonischemic cardiomyopathy in      the setting of poorly controlled hypertension, also with remote      history of ETOH abuse, has abstained from ETOH times 1 month. Most      recent echocardiogram shows an improvement in ejection fraction      from 15% to 30%.  2. Status post cardiac catheterization showing normal coronaries.  3. Moderately-to-severely elevated pulmonary pressures by cardiac      catheterization.  4. Previous hyperthyroidism treated with iodine 131, resulted in      iatrogenic  hypothyroidism currently managed by Dr. Jobe Igo.  5. History of poorly hypertension.  6. Left bundle branch block.  7. Questionable chronic obstructive pulmonary disease/asthma requiring      use of inhaler p.r.n.  8. History of tobacco use.   REVIEW OF SYSTEMS:  As stated above.   CURRENT MEDICATIONS:  Include;  1. Aspirin 81 mg daily.  2. Lisinopril 40.  3. Hydralazine 25 t.i.d.  4. Albuterol 1 puff t.i.d. p.r.n.  5. Lasix 40 b.i.d.  6. K-CL 20 daily.  7. Coreg 12.5 two tablets b.i.d.  8. Levothyroxine 100 mcg daily.   PHYSICAL EXAMINATION:  Weight 142, blood pressure 138/84 with a heart  rate of 67. Ms. Nagorski is in no acute distress. No jugular venous  distension at a 45 degree angle.  LUNGS: Clear to auscultation bilaterally.  CARDIOVASCULAR EXAM: Reveals an S1 and S2, regular rate and rhythm.  Patient has a 2/6 systolic ejection murmur.  ABDOMEN: Soft, nontender, positive bowel sounds.  LOWER EXTREMITIES: Without clubbing, cyanosis, or edema.   CLINICAL DIAGNOSTICS:  1. Status post recent CPX testing. The  final results I do not have      available, however preliminary report shows moderate limitation      secondary to heart failure.  2. Pulse oximetry/ambulation here in the clinic, patient walked      approximately 150 feet, pulse oximetry 100% room air initially.      Pulse between 99% and 100% throughout walk. Heart rate 67, peaked      at 72. Patient without lightheadedness or dizziness, complains of      extreme fatigue, ambulated very slowly.   IMPRESSION:  Class 3 New York Heart Association heart failure secondary  to nonischemic cardiomyopathy. At this time patient has had mild  improvement in her ejection fraction on full dose carvedilol therapy,  however she continues to remain quite symptomatic. I have discussed with  her at length options. I feel that she would benefit from evaluation by  Dr. Caryl Comes or Dr. Lovena Le for ICD possible BIV therapy. We will  continue  current medications. Schedule patient to be evaluated by EP.      [name], ACNP  Electronically Signed      Shaune Pascal. Bensimhon, MD  Electronically Signed   MB/MedQ  DD: 12/04/2006  DT: 12/05/2006  Job #: [id number]   cc:   Monia Sabal. Jobe Igo, M.D.

## 2010-09-20 NOTE — Assessment & Plan Note (Signed)
[facility] OFFICE NOTE   CHARINA, DICKES                    MRN:          [id number]  DATE:01/01/2007                            DOB:          Aug 02, 1945    Ms. Natter was seen today in conjunction with the congestive heart  failure clinic for followup of her newly implanted [facility] ICD, model  [id number], implanted on December 24, 2006 for nonischemic cardiomyopathy  and congestive heart failure.  Interrogation of her device demonstrates  P waves of 2.2 mV with an atrial impedance of 496 ohm and a threshold of  1 volt at 0.4 ms.  Her R waves measured 15.6 mV with an RV impedance of  504 ohm and a threshold of 1 volt at 0.4 ms.  Her shock impedances were  42 and 48 ohm.  Her LV impedance was 520 ohm with a threshold of 1 volt  at 0.4 ms.  Her battery voltage was 3.2 with a charge time of 9.4  seconds.  She was in normal sinus rhythm today.  With this implant, she  has been A pacing at less that 0.1% of the time and V pacing at 99.9% of  the time.  Today she complained of diaphragmatic stimulation.  Her LV  output was programmed at 4 volts at 0.4 ms.  I decreased that voltage  today to 2.4 volts.  She also had a significant hematoma.  She is not  currently on any blood thinners, no Plavix or Coumadin therapy.   Dr. Lovena Le did look at her wound today.  We elected to leave the Steri-  Strips on, as they were oozing underneath at the lateral aspect of  these.  She will return to the clinic in one week to reassess her  hematoma.  Dr. Lovena Le also gave her a prescription for Percocet today to  help with pain control.  He did give Ms. Pasek the option to go the  hospital to have the hematoma evacuated.  Right now, we will continue  with a plan of watchful waiting.  Ms. Durakovic will return to the device  clinic on September 4th to follow up on her hematoma.      Chanetta Marshall, RN,BSN  Electronically  Signed      [name], MD, [facility]  Electronically Signed   AS/MedQ  DD: 01/01/2007  DT: 01/01/2007  Job #: [id number]

## 2010-09-20 NOTE — Op Note (Signed)
NAMEXINYU, BASHLINE NO.:  [id number]   MEDICAL RECORD NO.:  [id number]          PATIENT TYPE:  INP   LOCATION:  2035                         FACILITY:  [facility]   PHYSICIAN:  [name], MD, FACCDATE OF BIRTH:  16-Aug-1945   DATE OF PROCEDURE:  12/24/2006  DATE OF DISCHARGE:                               OPERATIVE REPORT   PREOPERATIVE DIAGNOSES:  1. Nonischemic cardiomyopathy.  2. Congestive heart failure.  3. Left bundle branch block.   POSTOPERATIVE DIAGNOSS:  1. Nonischemic cardiomyopathy.  2. Congestive heart failure.  3. Left bundle branch block.   PROCEDURE:  Dual-chamber defibrillator implantation, with left  ventricular lead placement intraoperative defibrillation threshold  testing.   Following obtaining the informed consent, the patient was brought to the  electrophysiology laboratory and placed on the fluoroscopic table in the  supine position.  After routine prep and drape of the left upper chest,  lidocaine was infiltrated in the pectoral groove.  An incision was made  and carried down to layer of the prepectoral fascia using electrocautery  with sharp dissection.  A pocket was formed similarly.  Hemostasis was  obtained.   Thereafter attention was turned to gaining access to the extrathoracic  left subclavian vein, which was accomplished without difficulty; without  the aspiration of air or puncture of the artery.  Three separate  venipunctures were accomplished.  Guidewires were placed and retained,  and a 0 silk suture was placed around the more cephalad wires and  allowed to hang loosely.   Sequentially, a 9-French, a 9.5-French, and a 7-French sheath were  placed -- through which were passed a Medtronic Sprint Quattro [phone] dual-  coil active fixation defibrillator lead (Serial # [id number]); a  Medtronic MB2 coronary sinus cannulation system, and through the last  sheath a Medtronic 5076 45-cm active fixation atrial lead (serial  number  [id number]).  Under fluoroscopic guidance, the initial lead was  manipulated to the right ventricular apex, where the bipolar R-wave was  23.5; with a pacing impedance of 818 ohms, a threshold of 0.7 volts at  0.5 milliseconds.  Currented threshold was 1.2 mA.  There was no  diaphragmatic pacing at 10 volts, and the current of injury was flat.  This lead was secured to the prepectoral fascia.   The MB2 cannulation catheter allowed [name] relatively easy cannulation of  the coronary sinus, which turned out to be posteriorly displaced --  resulting in a sharp horizontal S-turn in the sheath.  Using the  attained One-Select system, we were able to deep throat the sheath  finally into the coronary sinus body, and a high lateral vein branch was  identified by the [facility] wire and then by nonocclusive venography.  Given the size and the curves, we attempted to use an Attain-II select  system to subselect this catheter.  However, when it happened the  rotation of the tip of the catheter was so sharp that it prolapsed out  from the vein.  I did not know that until the lesion we had put a 4193  78-cm lead in (Serial number [id number]).  The next thing I  learned was  that the 78-cm lead and the Attain-II select system are incompatible.  We then had to remove the Attain-II select system, and thankfully the  MB2 system was sufficiently supportive, that we were able to pass the  Whisper wire and then the aforementioned lead onto the left ventricular  free wall -- where the unipolar tip was left about halfway between the  base and the apex.  In this location the bipolar L-wave was 3.5, with a  pace impedance of 595 ohms, a threshold of 2 volts at 0.5 milliseconds.  Currented threshold was 4.5.  The outside 9.5-French sheath was removed,  and through the last remaining sheath the aforementioned right atrial  lead was admitted to the right atrial appendage -- where the bipolar P-  wave was 3.5, with  a pace impedance of 1060 ohms, a threshold of 0.9  volts at 0.5 milliseconds.  The current rate of injury was brisk; the  current was 1.1 mA, and there was no diaphragmatic pacing at 10 volts.  The atrial lead was secured to the prepectoral fascia, then under  fluoroscopic guidance the MB2 delivery system was removed -- with some  apical migration of the tip of the LV lead.  However, in this location  there was still no diaphragmatic pacing at 10 volts and the threshold  was 2.5 volts at 0.6 milliseconds.  This lead was secured to the  prepectoral fascia, and then the leads were then attached to a Medtronic  Concerta (0000000 device, serial number [id number]).  Through the  device the bipolar P-wave was 1.6, with a pace impedance of 768, a  threshold of 1.0 volts at 0.2.  The R-wave was 15.1, with a pace  impedance of 528, and a threshold of 0.1 volts  at 0.1.  The LV  impedance was 560, with a threshold at 2.5 volts at 0.6 milliseconds.   With these acceptable parameters recorded, defibrillation threshold  testing was undertaken.  Ventricular fibrillation was induced via the T-wave shock.  After a  total duration of 7 seconds, a 15 joules shock was delivered through a  measured resistance of 39 ohms -- terminating ventricular fibrillation  and restoring AP synchronously paced sinus rhythm.  The pocket was  copiously irrigated with antibiotic-containing saline solution.  Hemostasis was assured.  The leads and the pulse generator were placed  in the pocket; secured to the prepectoral fascia.  The wound was closed  in three layers in a normal fashion.  The wound was washed, dried and a  benzoin and Steri-Strip dressing was applied.  Needle counts, sponge  counts and instrument counts were correct at the end of procedure  according to staff.  The patient tolerated the procedure without  apparent complication.      [name], MD, [facility]  Electronically Signed     SCK/MEDQ  D:   12/24/2006  T:  12/24/2006  Job:  [id number]

## 2010-09-20 NOTE — Assessment & Plan Note (Signed)
[facility]                          CHRONIC HEART FAILURE NOTE   Morgan Clay, Morgan Clay                    MRN:          [id number]  DATE:04/02/2007                            DOB:          June 15, 1945    PRIMARY CARDIOLOGIST:  Minus Breeding, MD, [facility].   ELECTROPHYSIOLOGIST:  [name], MD, [facility].   PRIMARY CARE PHYSICIAN:  Monia Sabal. Jobe Igo, M.D.   Morgan Clay returns today for followup of her congestive heart failure  which is secondary to nonischemic cardiomyopathy most likely due to  poorly controlled hypertension in the past.  I last saw Morgan Clay in  October at which time she was complaining of some lightheadedness and  dizziness.  A home health nurse had checked her blood pressure and found  her to be hypotensive.  Apparently a call was placed to the office and  adjustments were made in the patient's medication at that time.  Spironolactone was put on hold.  Hydralazine was dropped to 12.5 t.i.d.  and her isosorbide was cut from 60 to 30.  I had asked [name] to  record her blood pressures and let me know what number she was getting.  She called the office here on November 6th, complaining of dizziness  with the room spinning.  She states her blood pressure check a [employer]  was 171/83.  The patient was instructed to follow up with her primary  care physician to make sure she had no inner ear abnormalities and to  follow up here today.  [name] states she has had a couple of these  episodes where her blood pressure was elevated.  In the past she was lumping her blood pressure medicines together  throughout the day.  I suspect this was probably contributing to her  orthostatic hypotension.  [name] also complains of increased weight  gain without volume overload, however, talking to her she has a rather  sedentary life.  She drops her grandkids off at school and then she just  states she just piddles around, goes to the store, does not  really do  anything exertional.  She picks the kids back up from daycare and then  fixes dinner and just watches TV in the evening.  However, over the last  year she has not actually felt like doing anything because of her heart  failure symptoms but since placement of her BiV device, she has improved  tremendously in regards to her symptomatic heart failure.  I suspect she  has adjusted to her sedentary lifestyle now with her diagnosis.  Previously, she was a Chief Operating Officer and very active, was out a lot visiting  with friends and socializing but then over the last year did not feel up  to it and has just settled into this lifestyle.  She states she is not  eating any more than she used to but she is definitely not as active.   PAST MEDICAL HISTORY:  1. Congestive heart failure secondary to nonischemic cardiomyopathy in      the setting of poorly controlled hypertension.  Previous EF 15%,      now improved to 60%.  2. Remote history of ETOH abuse.  3. Status post implantation of a CRT defibrillator device complicated      by pocket hematoma requiring evacuation, lavage, and antibiotic      therapy.  The patient had a Medtronic Concerto CRTD placed on      December 24, 2006.  4. Left bundle branch block.  5. Hypertension.  6. COPD.  7. Hypothyroidism, treated.  8. Status post cardiac catheterization showing normal coronaries.  9. Moderate to severely elevated pulmonary pressures.  10.History of tobacco use.   REVIEW OF SYSTEMS:  As stated above.   CURRENT MEDICATIONS:  1. Hydralazine 12.5 mg t.i.d.  2. Lasix 40 mg b.i.d.  3. Isosorbide 30 mg daily.  4. Carvedilol 25 mg b.i.d.  5. KCl 20 mEq b.i.d.  6. Aspirin 81 mg daily.  7. Lisinopril 40.  8. Levothyroxine 100 mcg daily.  9. Ventolin p.r.n.  10.Xanax p.r.n.  11.Ambien p.r.n.   PHYSICAL EXAMINATION:  VITAL SIGNS:  Weight 157.  The patient's weight  is up 2 pounds.  Blood pressure 176/90, manual 160/90 left arm, heart  rate  72.  NECK:  No signs of jugular vein distention at 45-degree angle.  LUNGS:  Clear to auscultation.  CARDIOVASCULAR:  Reveals an S1 S2.  Regular rate and rhythm.  ABDOMEN:  Soft, nontender.  Positive bowel sounds.  LOWER EXTREMITIES:  Without clubbing, cyanosis.  There is absolutely no  sign of lower extremity edema.   IMPRESSION:  1. Congestive heart failure without signs of volume overload in the      setting of nonischemic cardiomyopathy with an ejection fraction      returned to normal by recent echocardiogram.  2. The patient is hypertensive at this time.  To prevent orthostatic      hypotension, we are going to stager her medications.  First of all,      we are going to increase her hydralazine back to 25 mg t.i.d. and      place her isosorbide back to 60 mg daily, it is the isosorbide      mononitrate.  So, what she is going to do in the morning when she      first gets up is taker her thyroid supplement, 40 of Prinivil, 25      of hydralazine, and 25 Coreg.  At 10 a.m., she takes her aspirin,      potassium, and her fluid pill with breakfast.  At noon, she will      take her second dose of hydralazine 25 and isosorbide 60, at 3 p.m.      her second dose of Lasix, and then at bedtime her 25 of carvedilol.      We will see if this does not improve her symptoms and at the same      time keep her blood pressure under control. I am going to have her      return next week for just a blood pressure check and have the nurse      page me with the results of her blood      pressure.  We may need to resume her spironolactone and if that is      the case, we will also plan on checking blood work the following      week and I will see Madolin back in a month.      [name], ACNP  Electronically Signed      Shaune Pascal. Bensimhon, MD  Electronically Signed   MB/MedQ  DD: 04/02/2007  DT: 04/02/2007  Job #: [id number]

## 2010-09-20 NOTE — Assessment & Plan Note (Signed)
[facility]                          CHRONIC HEART FAILURE NOTE   Morgan Clay, Morgan Clay                    MRN:          [id number]  DATE:12/04/2006                            DOB:          Sep 04, 1945    [name] returns today for followup of her congestive heart failure  which is secondary to nonischemic cardiomyopathy, but in the setting of  history of poorly-controlled hypertension.  Previously echocardiogram  showed an EF of 15%.  The most recent echocardiogram in June showed an  EF of 25% to 30%.      [name], ACNP  Electronically Signed      Shaune Pascal. Bensimhon, MD    MB/MedQ  DD: 12/04/2006  DT: 12/05/2006  Job #: [id number]

## 2010-09-20 NOTE — Assessment & Plan Note (Signed)
[facility]                          CHRONIC HEART FAILURE NOTE   ABIAH, KOGLER                    MRN:          [id number]  DATE:05/13/2007                            DOB:          1945/11/20    PRIMARY CARDIOLOGIST:  Dr. Minus Breeding.   EP:  Dr. Jolyn Nap.   PRIMARY CARE:  The patient is followed by at [facility].  The phone number there is [phone].  Fritzi returns today  for followup of her congestive heart failure which secondary to  nonischemic cardiomyopathy most likely due to poorly controlled  hypertension in the past.  Siyah has several issues today.  Most  importantly be in her increased weight.  She has, over the last 6  months, gained approximately 20 pounds.  I have checked blood work in  the past.  She has had a normal BNP.  She does not feel as though she  has volume overload.  She states that she just does not do anything,  essentially gets no exercise.  She does spend time with her family but  states they she gets irritated easily and stressed that she does not  want to be around anyone.  She states she still is not sleeping even  with some Ambien p.r.n.  She does take her grand kids to school and  picks them up from school.  Other than that, really no social contact.  She is considering returning to work.  She would be working 3 hours a  day 3 days a week at [employer].   PAST MEDICAL HISTORY:  1. Congestive heart failure secondary to nonischemic cardiomyopathy in      the setting of poorly controlled hypertension.  EF now 60%      previously was 15%.  2. Status post CPX test in August 2008.  The patient CPX interpreted      as submaximal aerobic effort.  Exercise testing with gas exchange      demonstrated mild functional limitation due primarily to a      circulatory limitation.  3. Remote history  of EtOH abuse.  4. Status post implantation of CRT defibrillator  device complicated by      pocket hematoma requiring evacuation, lavage, and antibiotic      therapy.  The patient has a Medtronic Concerta CRTD placed August      2008.  5. Left bundle branch block.  6. History of hypertension.  7. COPD.  8. Treated hypothyroidism.  9. Status post cardiac catheterization showed normal coronaries.  10.Moderately severe elevated pulmonary pressures.  11.History of tobacco use.   REVIEW OF SYSTEMS:  As stated above.   CURRENT MEDICATIONS:  1. Lasix 40 mg b.i.d.  2. Carvedilol 25 mg b.i.d.  3. KCl 20 mEq b.i.d.  4. Aspirin 81.  5. Lisinopril 40 mg daily.  6. Levothyroxine 100 mcg daily.  7. Hydralazine 25 mg b.i.d.  8. Isosorbide 60 mg daily.   PHYSICAL EXAM:  Weight today is 161.  The patient's weight is up 4  pounds from November.  Blood pressure 139/81  with a heart rate is 65.  GENERAL:  The patient is in no acute distress.  Well-nourished 65-year-  old African American female without signs of jugular vein distention at  45 degrees angle.  LUNGS:  She has some fine high-pitched wheezing in her right lower lobe,  otherwise clear to auscultation.  CARDIOVASCULAR:  Exam reveals S1-S2, regular rate and rhythm.  ABDOMEN:  Soft, nontender, positive bowel sounds obese.  LOWER EXTREMITIES:  Without clubbing, cyanosis or edema.  Neurologically alert and oriented x3.   IMPRESSION:  Congestive heart failure secondary to nonischemic  cardiomyopathy with ejection fraction now at normal with history of  poorly controlled hypertension and moderately severe elevated pulmonary  pressures.  Ashelly' symptoms today suggestive of depression, I feel  it is probably situational considering her last year.  I talked with her  at length.  She is by no means suicidal, but I think she would benefit  from some medication to get her past this stage in her life.  I am going  to start her on Zoloft 25 mg p.o. nightly.  She is going to take Xanax  0.25 mg 1/2 tablet  nightly for the next 2 weeks to see if that helps  with her insomnia.  I have told her I want her walking 10 minutes every  day 5 days a week and build up to 30 minutes a day 5 days a week and I  am going to see her back in 4 weeks.  Each day I want her to slowly  increase the time that she walks.  We are going to hold off on her  returning to work at least get her activity level up, improve her  irritability and mood swings with the Zoloft, and then I will see her  back in 4 weeks at which time we will discuss further whether or not she  can return to work for a few hours a few days a week.  Also to increase  her hydralazine back to 25 mg t.i.d.  In the past I had to cut her  hydralazine and isosorbide down secondary to hypertension.  This is not  been an issue since of back in early fall.  Also she is going to be more  aware of her caloric intake and make adjustments as needed.     [name], ACNP  Electronically Signed      Minus Breeding, MD, [facility]  Electronically Signed   MB/MedQ  DD: 05/13/2007  DT: 05/13/2007  Job #: [id number]   cc:   Myriam Jacobson

## 2010-09-20 NOTE — Assessment & Plan Note (Signed)
[facility] OFFICE NOTE   Morgan Clay, Morgan Clay                    MRN:          [id number]  DATE:09/23/2007                            DOB:          19-May-1945    PRIMARY:  [facility].   REASON FOR PRESENTATION:  Evaluate patient with previous cardiomyopathy  and difficult to control hypertension.   HISTORY OF PRESENT ILLNESS:  The patient is a pleasant 65 year old  African American female with a history of cardiomyopathy.  Her EF had  15%, but was most recently 60%.  It is nonischemic.  She has been  following in the Heart Failure Clinic closely.  She has been having  management of her blood pressure, though it has been still somewhat  difficult to control.   The patient says that compared when we started treating her, she is so  much better.  Her breathing is a 10.  She denies any shortness of  breath.  She had no PND or orthopnea.  She has had no palpitations,  presyncope or syncope.  She denies any chest pressure, neck or arm  discomfort.   PAST MEDICAL HISTORY:  Cardiomyopathy (EF had been 15% and now 60%.  It  is presumed nonischemic related to hypertension), difficult to control  hypertension, remote history of ETOH abuse, situational depression,  status post implantation of CRT device by Dr. Caryl Comes (Medtronic Concerta  device), left bundle branch block, hypertension, COPD, hypothyroidism,  pulmonary hypertension at the time of cardiac catheterization (there was  no evidence of this on an echocardiogram in 2008), history of tobacco  use.   ALLERGIES:  PERCOCET CAUSES STOMACH UPSET.   MEDICATIONS:  1. Carvedilol 25 mg b.i.d.  2. Potassium 20 mEq b.i.d.  3. Aspirin 81 mg daily.  4. Lisinopril 40 mg daily.  5. Levothyroxine 100 mcg daily.  6. Isosorbide 60 mg daily.  7. Zoloft 100 mg daily.  8. Lasix 40 mg daily.  9. Hydralazine 50 mg t.i.d..   REVIEW OF SYSTEMS:  As stated in the HPI and  otherwise negative for  other systems.   PHYSICAL EXAMINATION:  GENERAL:  The patient is in no distress.  VITAL SIGNS:  Blood pressure 154/78, heart rate 57 and regular.  Weight  163 pounds, body mass index 32.  HEENT:  Eyes unremarkable.  Pupils equal, round and reactive to light.  Fundi not visualized.  Oral mucosa unremarkable.  NECK:  No jugulovenous distention at 45 degrees.  Carotid upstroke brisk  and symmetrical.  No bruits, no thyromegaly.  LYMPHATICS:  No cervical, axillary or inguinal adenopathy.  LUNGS:  Clear to auscultation bilaterally.  BACK:  No costovertebral angle tenderness.  CHEST:  Unremarkable.  HEART:  PMI not displaced or sustained.  S1-S2 within normal limits.  No  S3-S4.  No clicks, no rubs, no murmurs.  ABDOMEN:  Obese, positive bowel sounds.  Normal in frequency and pitch.  No bruits, no rebound, no guarding.  No midline pulsatile mass.  No  hepatomegaly, no splenomegaly.  SKIN:  No rashes, no nodules.  EXTREMITIES:  2+ pulses throughout.  No edema, cyanosis  or clubbing.  NEURO:  Oriented to person, place and time.  Cranial nerves II-XII  grossly intact.  Motor grossly intact.   EKG - sinus rhythm with 100% ventricular pacing.   ASSESSMENT/PLAN:  1. Cardiomyopathy.  The patient's ejection fraction has improved with      control of her hypertension and perhaps other lifestyle changes.      She has class I symptoms.  At this point, no further testing is      planned.  Will continue with treatment of the risk factors.  2. Hypertension.  Blood pressure still not well controlled.  I am      going to add spironolactone at 25 mg a day.  She is going to take      her potassium only once a day, and we will keep a very close eye on      this.  She may need none at all.  She had a normal creatinine and a      low normal potassium earlier this year.  She will get a BMET in 1      week, and then again in 1 month.  We will keep an eye on her blood      pressure year  in the Heart Failure Clinic.  Of note, I will also      order a sleep study.  She was suppose to have some home pulse      oximeter monitoring apparently.  This never materialized.  She does      have snoring, daytime somnolence and difficult to control      hypertension.  Therefore, she will have a sleep study.  3. Fatigue as above.  4. Status post CRT (cardiac resynchronization therapy).  The patient      will have this followed in the device clinic.  5. Chronic obstructive pulmonary disease.  She is breathing much      better and no further therapy is warranted.  6. Hypothyroidism.  She will continue to have thyroid replacement      followed as [facility].   FOLLOW UP:  We will have her come back in about 6 weeks in the Heart  Failure Clinic, though she come back for the labs as described.    Minus Breeding, MD, [facility]  Electronically Signed   JH/MedQ  DD: 09/23/2007  DT: 09/23/2007  Job #: [id number]   cc:   Karoline Caldwell

## 2010-09-20 NOTE — Assessment & Plan Note (Signed)
[facility]                          CHRONIC HEART FAILURE NOTE   TAMSYN, TRISSEL                    MRN:          [id number]  DATE:11/05/2007                            DOB:          19-Jan-1946    PRIMARY CARDIOLOGIST:  Minus Breeding, MD, [facility].   PRIMARY CARE:  [facility], [employer].   ELECTROPHYSIOLOGY:  [name], MD, [facility].   PULMONOLOGY:  Kathee Delton, MD,[facility]   Adeli returns today for followup of her congestive heart failure  which is secondary to nonischemic cardiomyopathy.  Since I saw her last  she has followed up with Dr. Percival Spanish for routine cardiology visit  because of hypertension.  He added spironolactone 25 mg daily.  She  returns today for blood work and follow up.  Also, I had arranged for  Caroly to have a sleep study done.  This was carried out on October 09, 2007.  The patient with mild obstructive sleep apnea, 02 desaturation as  low as 81%.  Based on findings treatment could include weight loss,  upper airway surgery or oral appliances and CPAP.  Clinical correlation  suggested because of Jeydi's ongoing day times somnolence, fatigue,  and decreased concentration, she is going to proceed with CPAP.   PAST MEDICAL HISTORY.:  1. Congestive heart failure secondary to nonischemic cardiomyopathy in      the setting of poorly controlled hypertension in the past with EF      previously 15%, now 60%.  2. Status post CPX test in 2008 showing submaximal aerobic effort,      mild functional limitations due primarily to circulatory      limitation.  3. Remote history of EtOH abuse.  4. Situational depression.  5. Insomnia status post sleep study showing mild obstructive sleep      apnea, pending CPAP initiation.  6. Status post implantation of a CRT device, followed by Dr. Caryl Comes,      Medtronic Concerto device.  7. Left bundle-branch block.  8. Hypertension.  9. COPD  10.Treated  hypothyroidism.  11.Status post cardiac catheterization showed normal coronaries with      moderately to severely elevated pulmonary pressures.  12.History of tobacco use.   REVIEW OF SYSTEMS:  As stated above, otherwise negative.   CURRENT MEDICATIONS:  1. Carvedilol 25 b.i.d.  2. KCl 20.  It should be 20 mEq daily.  The patient still taking it      b.i.d.  3. Spironolactone 25 mg daily.  4. Aspirin 81 daily.  Aedyn should be taking 40 mg b.i.d., but she      never increased, she is still taking it daily.  5. Levothyroxine 100 mcg daily.  6. Isosorbide 60 mg daily.  7. Zoloft 100 mg daily.  8. Lasix 40 mg daily.  9. Hydralazine 25 mg 2 tablets t.i.d.  10.P.r.n. medications include trazodone and Ventolin.   PHYSICAL EXAMINATION:  VITALS:  Weight 159 pounds, weight is down 4  pounds, blood pressure 97/64 with a heart rate 69.  GENERAL:  Ebony is in no acute distress.  HEENT:  No signs of jugular vein  distention at 45 degrees angle.  LUNGS:  Clear to auscultation bilaterally.  CARDIOVASCULAR:  Reveals S1-S2, regular rate and rhythm.  ABDOMEN:  Soft, nontender, positive bowel sounds.  LOWER EXTREMITIES:  Without clubbing, cyanosis or edema.  NEUROLOGIC:  Alert and oriented x3.   IMPRESSION:  Congestive heart failure secondary to nonischemic  cardiomyopathy in the setting of poorly controlled hypertension in the  past.  The patient currently class I class, early II.  Blood pressure  well controlled at this time.  The patient will need blood work today.  Also, with ongoing daytime somnolence and insomnia pending a CPAP  initiation.  I will see Shirrell back in 6 weeks for a re-evaluation.      [name], ACNP  Electronically Signed      Minus Breeding, MD, [facility]  Electronically Signed   MB/MedQ  DD: 11/05/2007  DT: 11/05/2007  Job #: [id number]

## 2010-09-20 NOTE — Discharge Summary (Signed)
NAMEJENNIYAH, Morgan Clay NO.:  [id number]   MEDICAL RECORD NO.:  [id number]          PATIENT TYPE:  INP   LOCATION:  2035                         FACILITY:  [facility]   PHYSICIAN:  [name], MD, FACCDATE OF BIRTH:  Mar 22, 1946   DATE OF ADMISSION:  12/24/2006  DATE OF DISCHARGE:  12/26/2006                               DISCHARGE SUMMARY   ADDENDUM   MEDICATION ON DISCHARGE, AUGUST 20:  1. Enteric-coated aspirin 81 mg daily.  2. Lisinopril 40 mg daily.  3. Furosemide 40 mg twice daily.  4. Potassium chloride 20 mEq daily.  5. Coreg 25 mg twice daily.  6. Levothyroxine 100 mcg daily.  7. Hydralazine 25 mg twice daily.  8. Isosorbide Mononitrate 90 mg daily.  9. Spironolactone 25 mg daily.  10.Darvocet-N 100 one to tabs every four to six hours as needed for      pain.   The new changes are to isosorbide mononitrate down from 120 to 90,  hydralazine remains at her preadmission dose of 25 mg twice daily, and  the new medication of course is spironolactone 25 mg daily.      Sueanne Margarita, [place]      [name], MD, [facility]  Electronically Signed    GM/MEDQ  D:  12/26/2006  T:  12/26/2006  Job:  [id number]   cc:   [name], ACNP  Dr. Jobe Igo

## 2010-09-20 NOTE — Assessment & Plan Note (Signed)
[facility]                          CHRONIC HEART FAILURE NOTE   PERRI, KRANE                    MRN:          [id number]  DATE:10/30/2006                            DOB:          [birth year]    [name] returns today for followup of her congestive heart failure  which is secondary to nonischemic cardiomyopathy, most likely due to  poorly controlled hypertension.  Previous echocardiogram showed an EF of  15%.  Repeat echocardiogram done just a few days ago shows improvement  in EF of 25% to 30%.  [name], however, continues to have Class III  symptoms.  She complains of ongoing shortness of breath with minimal  exertion, increased fatigue and weakness in her legs.  She denies any  peripheral edema, orthopnea or PND.  Denies any episodes of chest  discomfort, presyncope or syncopal episodes.  Appetite has been good.  She states she was very thirsty yesterday and her weight actually is up  2 pounds today.  She states she is down to 1 cigarette a week,  previously was smoking a cigarette every day.  Also previously drinking  2 glasses of red wine each day.  She is having a glass of wine  occasionally now.  States she continues to have 1 good day followed by a  bad day, has experienced some insomnia and increased stress secondary to  her medical problems.   PAST MEDICAL HISTORY:  Includes:  1. Congestive heart failure secondary to nonischemic cardiomyopathy in      the setting of previously poorly controlled hypertension.  Also      history of ETOH abuse, EF currently improved to 25% to 30%,      previously 15%.  2. Status post cardiac catheterization showing normal coronaries.  3. Moderately to severely elevated pulmonary pressures by cardiac      cath.  4. Previous hyperthyroidism, treated with iodine-181, resulting in      iatrogenic hypothyroidism, currently managed by Dr. Jobe Clay.  5. Left bundle branch block.  6. Questionable  COPD or asthma requiring use of inhaler.   REVIEW OF SYSTEMS:  As stated above.   CURRENT MEDICATIONS:  Include:  1. Aspirin 81 mg daily.  2  Lisinopril 40 mg daily.  1. Hydralazine 25 mg t.i.d.  2. Albuterol 1 puff t.i.d.  3. Lasix 40 mg b.i.d.  4. Levothroid 75 mcg daily.  5. Coreg 12.5 mg 2 tablets b.i.d.   PHYSICAL EXAMINATION:  Weight 137.  Blood pressure 130/68 with heart  rate of 60.  [name] is in no acute distress.  No jugular vein distention at 45 degree angle.  LUNGS:  Distant but clear to auscultation.  CARDIOVASCULAR:  Reveals an S1 and S2.  Regular rate and rhythm.  ABDOMEN:  Soft, nontender, positive bowel sounds.  LOWER EXTREMITIES:  Without clubbing, cyanosis or edema.  NEUROLOGICALLY:  Patient alert and oriented x3.   IMPRESSION:  Congestive heart failure, currently Class III symptoms.  No  signs of volume overload at this time.  [name] continues to have  ongoing fatigue.  We will consider adding digoxin back  to her medical  regimen.  For now, we will continue current medications and I am going  to arrange for patient to have a CPX test for further evaluation.  I  have also given patient a prescription for handicap parking permit.  In  reviewing her lab work from June 2, potassium 3.8, BUN 26, creatinine  1.1, BNP was 185.  Patient is not currently on a potassium supplement.   ADDENDUM:  I have called [name] at home and spoken with her  granddaughter and asked that Morgan Clay start back on her Kay-Ciel 20  mEq, one p.o. q.d. and I will plan on checking laboratory work on her  next visit.      Morgan Clay, ACNP  Electronically Signed      Morgan Breeding, MD, [facility]  Electronically Signed   MB/MedQ  DD: 10/30/2006  DT: 10/30/2006  Job #: [id number]   cc:   Morgan Clay. Morgan Clay, M.D.

## 2010-09-20 NOTE — Assessment & Plan Note (Signed)
[facility]                            CARDIOLOGY OFFICE NOTE   BALEY, ALTHEIDE                    MRN:          [id number]  DATE:04/17/2008                            DOB:          January 08, 1946    PRIMARY CARE PHYSICIAN:  [facility].   REASON FOR PRESENTATION:  Evaluate the patient with cardiomyopathy and  hypertension.   HISTORY OF PRESENT ILLNESS:  The patient has been followed in Heart  Failure Clinic.  She had a previous significantly reduced ejection  fraction.  However, on the most recent echo it was much improved at 60%.  She had been feeling much better.  She is a little more dyspneic today.  I note her blood pressure is little more elevated.  She has had a cold.  She has been taking some cold medicine which I think is just Tylenol PM.  She has not been having any salt indiscretion.  She not getting  excessive fluids.  She is not describing PND or orthopnea.  She is not  having chest pain, neck or arm discomfort.  She has had no palpitation,  presyncope, or syncope.   PAST MEDICAL HISTORY:  1. Congestive heart failure.  2. Nonischemic myopathy (EF had been down as low as 15%, improved to      60%).  3. Remote EtOH abuse.  4. Remote tobacco abuse.  5. Situational pressure.  6. Sleep apnea on CPAP.  7. Status post implantation of a CRT device by Dr. Caryl Comes.  8. Left bundle-branch block.  9. Hypertension.  10.COPD.  11.Hypothyroidism.  12.Left ventricular hypertrophy.  13.Moderately elevated pulmonary pressures on previous      catheterization.   ALLERGIES/INTOLERANCES:  PERCOCET.   CURRENT MEDICATIONS:  1. Carvedilol 25 mg b.i.d.  2. Potassium 20 mEq b.i.d.  3. Aspirin 81 mg daily.  4. Lisinopril 40 mg daily.  5. Levothyroxine.  6. Isosorbide 60 mg daily.  7. Zoloft 100 mg daily.  8. Lasix 40 mg daily.  9. Hydralazine 50 mg t.i.d.  10.Spironolactone 25 mg daily.   REVIEW OF SYSTEMS:  As stated  in the HPI and otherwise negative for  other systems.   PHYSICAL EXAMINATION:  GENERAL:  The patient is in no distress.  VITAL SIGNS:  Blood pressure 181/91, heart rate 62 and regular, weight  165 pounds.  NECK:  No jugular venous distention at 45 degrees, carotid upstroke  brisk and symmetric, no bruits, thyromegaly.  LYMPHATICS:  No adenopathy.  LUNGS:  Clear to auscultation bilaterally.  BACK:  No costovertebral angle tenderness.  CHEST: Unremarkable.  HEART:  PMI not displaced or sustained, S1 and S2 within normal limits,  no S3, no S4, no clicks, no rubs, no murmurs.  ABDOMEN:  Mildly obese, positive bowel sounds normal in frequency and  pitch, no bruits, no rebound, no guarding, no midline pulsatile mass, no  organomegaly.  SKIN:  No rashes, no nodules.  EXTREMITIES:  Pulses 2+ throughout, no edema, no cyanosis, no clubbing.  NEUROLOGIC:  Oriented to person, place, and time, cranial nerves II  through XII grossly intact, motor grossly intact.  EKG:  Sinus rhythm with ventricular pacing, 100% capture.   ASSESSMENT AND PLAN:  1. Dyspnea.  The patient has some mild dyspnea.  This maybe related to      her blood pressure.  However, blood pressure seems to be quite      labile.  Rather than up her medicine now, I am going to have her      keep a blood pressure diary.  She can do this at [place] 3 times a      week randomly.  She will write this down and come back in 4 weeks.      At that point, if her blood pressure diary indicates her pressures      are elevated, I would probably go up on the spironolactone.  She      knows to avoid salt, and we discussed this at length.  I doubt that      she has had any reduction in her ejection fraction since September.  2. Hypertension, as above.  3. Followup.  We will see the patient again in 4 weeks as above.     Minus Breeding, MD, [facility]  Electronically Signed    JH/MedQ  DD: 04/17/2008  DT: 04/17/2008  Job #: [id number]   cc:    [facility]

## 2010-09-20 NOTE — Assessment & Plan Note (Signed)
[facility]                          CHRONIC HEART FAILURE NOTE   Morgan Clay, Morgan Clay                    MRN:          [id number]  DATE:10/30/2006                            DOB:          [birth year]    ADDENDUM:  I have called Morgan Clay at home and spoken with her  granddaughter and asked that [name] start back on her Kay-Ciel 20  mEq, one p.o. q.d. and I will plan on checking laboratory work on her  next visit.     [name], ACNP  Electronically Signed    MB/MedQ  DD: 10/30/2006  DT: 10/30/2006  Job #: [id number]   cc:   Monia Sabal. Jobe Igo, M.D.

## 2010-09-20 NOTE — Assessment & Plan Note (Signed)
[facility]                          CHRONIC HEART FAILURE NOTE   Morgan Clay, Morgan Clay                    MRN:          [id number]  DATE:02/06/2008                            DOB:          17-Sep-1945    PRIMARY CARDIOLOGIST:  Minus Breeding, MD, [facility]   PRIMARY CARE:  [facility].   ELECTROPHYSIOLOGIST:  [name], MD, [facility]   Morgan Clay returns today for further followup of her congestive heart  failure, which is secondary to nonischemic cardiomyopathy.  Morgan Clay  has been doing well from a heart failure perspective since I last saw  her.  I checked blood work on her on January 16, 2008, found her to  have a TSH of 13.68.  I faxed this over to [facility] that same day, I  got the results.  Chantina states they just increased her levothyroxine  to 125 mcg daily.  She complains of feeling very sluggish, fatigue, no  energy, increased weight that is not fluid related.  Has not been doing  her walking on the track because she states she just has no energy.  Otherwise, denies any orthopnea, PND.  Denies any lightheadedness,  dizziness, any problems from her ICD.  She has been out of her potassium  for a couple weeks.  Her last K was 3.4 on January 16, 2008.  Morgan Clay was supposed to return a week after that to have her potassium  repeated, but she did not.   PAST MEDICAL HISTORY:  1. Congestive heart failure secondary to nonischemic cardiomyopathy in      the setting of poorly controlled hypertension in the past with EF      previously 50%, now 60%.  2. Status post CPX stent in 2008 showing submaximal aerobic effort,      mild functional limitations due to primarily of circulatory      limitation.  3. Remote history of EtOH abuse.  4. Situational depression.  5. Obstructive sleep apnea with CPAP compliance.  6. Status post implantation of a CRT device followed by Dr. Caryl Comes.  7. Left bundle-branch block.  8.  Hypertension.  9. COPD  10.Treated hypothyroidism.  11.Status post cardiac catheterization showing normal coronaries with      moderately severely elevated pulmonary pressures.  12.History of tobacco abuse.   REVIEW OF SYSTEMS:  As stated above, otherwise negative.   CURRENT MEDICATIONS:  1. Carvedilol 25 mg b.i.d.  2. Aspirin 81.  3. Lisinopril 40 daily.  4. Levothyroxine 125 mcg daily.  5. Spironolactone 25 daily.  6. Hydralazine 25 mg 2 tablets t.i.d.  7. Lasix 40 mg daily.  8. Zoloft 100 mg daily.  9. Isosorbide 60 mg daily.  10.KCl should be 20 mEq b.i.d.   PHYSICAL EXAMINATION:  VITAL SIGNS:  Weight 165 pounds, blood pressure  117/65, with a heart rate of 60.  GENERAL:  Morgan Clay is in no acute distress.  NECK:  No signs of jugular vein distention at 45-degree angle.  LUNGS:  Clear to auscultation bilaterally.  CARDIOVASCULAR:  Reveals S1 and S2, regular rate and rhythm.  ABDOMEN:  Obese, soft, nontender.  Positive bowel sounds.  EXTREMITIES:  Lower extremities without clubbing, cyanosis, or edema.  NEUROLOGICAL:  Alert and oriented x3.   IMPRESSION:  1. Congestive heart failure secondary to nonischemic cardiomyopathy      with ejection fraction 60%.  We will continue current medications.      I have asked Claude to resume her potassium 20 mEq b.i.d.,      written her a new prescription for this.  2. Hypothyroidism, to be followed by [facility].      [name], ACNP  Electronically Signed      Minus Breeding, MD, [facility]  Electronically Signed   MB/MedQ  DD: 02/06/2008  DT: 02/06/2008  Job #: [id number]   cc:   Myriam Jacobson

## 2010-09-20 NOTE — Assessment & Plan Note (Signed)
[facility]                          CHRONIC HEART FAILURE NOTE   MARLEINA, METTER                    MRN:          [id number]  DATE:06/12/2007                            DOB:          May 28, 1945    PRIMARY CARDIOLOGIST:  Minus Breeding, MD, [facility].   ELECTROPHYSIOLOGY:  [name], MD, [facility]   PRIMARY CARE PHYSICIAN:  [facility]   Ms. Chap returns today for follow-up of her congestive heart failure  which is secondary to nonischemic cardiomyopathy most likely due to  poorly controlled hypertension in the past.  When I last saw Barrie  back in January, she was complaining of increased weight gain, increased  fatigue with signs of depression and I start her on Zoloft, titrated her  up to 50 mg daily from 25 and also gave her on some Xanax to help her  relax at night to sleep as she was having insomnia.  I also asked her to  start a walking program.  She states she is now up to 30-minute walk  every day, some days she walks 45 minutes.  She states this has helped  with her situational depression, but she still is not sleeping very  well. I had approved her to return to work part-time, but states she has  not been able to work because of the economy and but states she would  like to return if job becomes available. Otherwise, she denies any pre-  syncope, syncope, any episodes of firing from her defibrillator. No  peripheral edema, orthopnea or PND.   PAST MEDICAL HISTORY:  1. Congestive heart failure secondary to nonischemic cardiomyopathy in      the setting of poorly controlled hypertension in the past; ejection      fraction previously 15%, now 60% by echocardiogram.  2. Status post CPX test in August 2008; submaximal aerobic effort.      Exercise testing with gas exchange demonstrated mild functional      limitations due primarily to circulatory limitation.  3. Remote history of ETOH abuse.  4. Situational  depression.  5. Status post implantation of a CRT defibrillator device complicated      by pocket hematoma requiring evacuation lavage and antibiotic      therapy.  The patient has a Medtronic Concerta CRT defibrillator      placed August 2008.  6. Left bundle-branch block.  7. Hypertension.  8. Chronic obstructive pulmonary disease.  9. Treated hypothyroidism.  10.Status post cardiac catheterization showing normal coronaries.  11.Moderately severe elevated pulmonary pressures.  12.History of tobacco use.   REVIEW OF SYSTEMS:  As stated above, otherwise negative.   CURRENT MEDICATIONS:  1. Include Lasix 40 mg b.i.d.  2. Carvedilol 25 mg b.i.d.  3. KCl 20 mEq b.i.d.  4. Aspirin 81 mg daily.  5. Lisinopril 40 mg daily.  6. Levothyroxine 100 mcg daily.  7. Hydralazine 25 mg t.i.d.  8. Isosorbide 60 mg daily.  9. Zoloft 50 mg daily.  10.Xanax 0.25 mg p.r.n.   PHYSICAL EXAM:  Weight 161.  Blood pressure 165/81 manual; left arm  152/78  with a heart rate of 59.  Ms. Cornellier is in no acute distress.  No signs of jugular vein distention at 45 degrees angle.  LUNGS:  Clear to auscultation bilaterally.  Cardiovascular exam reveals S1-S2, regular rate and rhythm.  ABDOMEN:  Soft, nontender, positive bowel sounds.  LOWER EXTREMITIES:  Without clubbing, cyanosis or edema.  Neurologically alert and oriented x3.   IMPRESSION:  1. Congestive heart failure secondary to nonischemic cardiomyopathy      with ejection fraction now 60%.  The patient still hypertensive      with class II New York Heart Association symptoms.  I am going to      decrease her Lasix to 40 mg daily as her fluid volume status has      been very stable, increase her hydralazine to 37.5 mg t.i.d.  2. Queen is still showing signs of depression without suicidal      ideation or tendencies.  I am going to increase her Zoloft 100 mg      daily and have her take it in the morning instead of at night as      she states it  is keeping her awake.  Will stop Xanax and start her      on trazodone 50 mg at bedtime. If this does not help with her      insomnia and depression can refer her to [facility] for further      follow-up.      [name], ACNP  Electronically Signed      Shaune Pascal. Bensimhon, MD  Electronically Signed   MB/MedQ  DD: 06/12/2007  DT: 06/12/2007  Job #: [id number]   cc:   Myriam Jacobson

## 2010-09-20 NOTE — Op Note (Signed)
NAMEAMIRIA, Morgan Clay NO.:  [id number]   MEDICAL RECORD NO.:  [id number]          PATIENT TYPE:  INP   LOCATION:  2004                         FACILITY:  [facility]   PHYSICIAN:  [name], MD, FACCDATE OF BIRTH:  1945-10-12   DATE OF PROCEDURE:  01/05/2007  DATE OF DISCHARGE:                               OPERATIVE REPORT   PREOPERATIVE DIAGNOSIS:  Pocket hematoma with extravasation of blood for  the last 48 hours.   POSTOPERATIVE DIAGNOSIS:  Pocket hematoma with extravasation of blood  for the last 48 hours.   PROCEDURE:  Pocket exploration, hematoma removal.   DESCRIPTION OF PROCEDURE:  Following obtaining informed consent, the  patient was brought to the catheterization laboratory and placed on the  table in the supine position.  After routine prep and drape of the left  upper chest, and an antecedent antibiotic effusion.  The patient was  anesthetized locally using lidocaine.  The incision was opened; and the  sutures were removed.  I should note that there were not abundant suture  fragments.  None was left behind; however, that I could find.   With opening of the pocket there was no evidence of fresh blood.  The  blood clot was removed; its total was probably 100-150 mL; and then we  spent the next 30 minutes working on hemostasis.  There was one area  where the prepectoral fascia had been eroded that was bleeding; pressure  was held, the Bovie was used in part; a lot of digital pressure was  applied.  Then the pocket was irrigated copiously with antibiotic  containing saline solution.  The device was placed back in the pocket;  and the pocket was reirrigated with another 500 mL of irrigant.   At this point, Surgicel was placed on the anterior-posterior aspects of  the device, as well as along the cephalad margin where we had seen the  greatest oozing.  As noted pressure had been held along this area  probably for 10-15 minutes in different spots.   The device was then  affixed to the prepectoral fascia; and the wound was closed in three  layers.  The patient tolerated the procedure with a moderate amount of  discomfort, without apparent complication.      [name], MD, [facility]  Electronically Signed     SCK/MEDQ  D:  01/05/2007  T:  01/06/2007  Job:  [id number]   cc:   Electrophysiology laboratory

## 2010-09-20 NOTE — Assessment & Plan Note (Signed)
[facility]                          CHRONIC HEART FAILURE NOTE   ROSCHELLE, Clay                    MRN:          [id number]  DATE:08/05/2007                            DOB:          Oct 03, 1945    PRIMARY CARDIOLOGIST:  Minus Breeding, MD.   PRIMARY CARE PHYSICIAN:  The patient is followed by [facility] at [address]   ELECTROPHYSIOLOGIST:  Dr. Jolyn Nap.   Morgan Clay returns today for follow-up of her congestive heart failure  which is secondary to nonischemic cardiomyopathy most likely due to  poorly controlled hypertension in the past. Morgan Clay returns today  status post recent adjustment in her diuretic. Her dose was decreased to  40 mg daily and her hydralazine was increased to 37.5 mg t.i.d. I had  asked Ichelle to start an exercise program as she had put on a  significant amount of weight over the last 6 months and she was very  lethargic and depressed.  She is now walking half a mile 7 days a week.  She states she walks at 2 o'clock every afternoon.  She also volunteers  on Wednesday and Friday at the [employer] on [address].  She states she is feeling better.  She is sleeping much better with the  trazodone. She states her energy level is slowly returning. She denies  any problems with her ICD,  presyncope or syncopal episodes and states  her weight has been stable at home with the  decrease in her Lasix dose.   PAST MEDICAL HISTORY:  1. Congestive heart failure secondary to nonischemic cardiomyopathy in      the setting of poorly controlled hypertension in the past with an      ejection fraction previously 15% and now 60% by echocardiogram.  2. Status post CPX in 2008 showing submaximal aerobic effort.      Exercise testing with gas exchange demonstrated mild functional      limitations due primarily to circulatory limitation.  3. Remote history ETOH abuse.  4. Situational depression.  5. Status post  implantation of a CRT defibrillator device complicated      by pocket hematoma requiring evacuation lavage and antibiotic      therapy.  The patient has a Medtronic Concerta device placed August      2008.  6. Left bundle branch block.  7. Hypertension.  8. COPD.  9. Treated hypothyroidism.  10.Status post cardiac catheterization showing normal coronaries with      moderately to severe elevated pulmonary pressures.  11.History of tobacco use.   REVIEW OF SYSTEMS:  As stated above, otherwise negative.   CURRENT MEDICATIONS:  1. Lasix 40 mg daily.  2. Hydralazine 37.5 t.i.d.  3. Zoloft 100 mg daily.  4. Isosorbide 60 mg daily.  5. Levothyroxine 100 mcg daily.  6. Lisinopril 40 mg daily.  7. Aspirin 81 mg daily.  8. Kay Ciel 20 mEq b.i.d.  9. Carvedilol 25 mg b.i.d.   PRN MEDICATIONS:  Ventolin  inhaler, Ambien and trazodone.   PHYSICAL EXAM:  Weight 162, up 1 pound, blood pressure 186/94 while  manually  170/90 with a heart rate of 57.  Tillie is in no acute distress.  No signs of jugular vein distention.  LUNGS:  Clear to auscultation.  CARDIOVASCULAR:  Reveals S1 and S2, regular rate and rhythm.  ABDOMEN:  Obese, soft, nontender, positive bowel sounds.  LOWER EXTREMITIES:  Without clubbing, cyanosis or edema.  NEUROLOGIC:  Alert and oriented x3.   IMPRESSION:  Congestive heart failure secondary to nonischemic  cardiomyopathy with EF now 60%. The patient is still hypertensive with  class 2 New York Heart Association symptoms. I increased hydralazine to  50 mg t.i.d., continued her other medications and followed her up in 4  weeks at which time she will need blood work repeated. Blood pressure  not therapeutic at that time. We will need to make further changes in  medication.  The patient also needs to follow up with Dr. Percival Spanish her  primary cardiologist for a routine cardiology visit. We will arrange  this the next appointment also.      [name], ACNP   Electronically Signed      Minus Breeding, MD, [facility]  Electronically Signed   MB/MedQ  DD: 08/05/2007  DT: 08/05/2007  Job #: [id number]   cc:   Myriam Jacobson

## 2010-09-20 NOTE — Assessment & Plan Note (Signed)
Morgan Clay OFFICE NOTE   KHUSHBOO, NEGA                    MRN:          [id number]  DATE:12/24/2007                            DOB:          12-08-1945    Morgan Clay is status post CRTD implantation for congestive heart  failure in the setting of nonischemic cardiomyopathy, which has been  associated with now normalization of her left ventricular function.  She  is also on sleep apnea therapy with some improvement in her fatigue.  Her medications are reviewed and are not recounted here.   On examination, her blood pressure is 144/74, her pulse is 58.  Her  lungs were clear.  Heart sounds were regular.  Extremities were without  edema.   Interrogation of Medtronic Concerto device demonstrates a P-wave of 2.5  with impedance of 592 with threshold of 0.4 in both RA and RV.  The  impedance was 504 and RV amplitude was 16.4.  The LV impedance was 48.  Battery voltage was 3.16.   IMPRESSION:  1. Nonischemic cardiomyopathy with now normalization of left      ventricular systolic function.  2. Status post CRTD potentially contributing to nonischemic      cardiomyopathy with improvement of heart failure status.  3. Ongoing problems with some dyspnea on exertion, question diastolic      component.   Morgan Clay is stable at this time.  We will plan to see her again in 1  year's time and she is followed very closely at the [facility].     [name], MD, [facility]  Electronically Signed    SCK/MedQ  DD: 12/24/2007  DT: 12/25/2007  Job #: [id number]

## 2010-09-20 NOTE — Assessment & Plan Note (Signed)
[place]                                 ON-CALL NOTE   [name], [name]                    MRN:          [id number]  DATE:01/03/2007                            DOB:          1945-05-23    PRIMARY CARDIOLOGIST:  Dr. Virl Axe.   Morgan Clay is a patient of Dr. Olin Pia who is recently status post BiV  ICD placement on December 24, 2006.  She was seen in the office by Chanetta Marshall on August 26 and at that time she was noted to have a small  amount of oozing from under her Steri-strips as well as swelling.  The  patient was noted to have a hematoma at the ICD insertion site prior to  discharge from the hospital on the 20th.  When she was seen in clinic on  the 26th she was offered the opportunity to be hospitalized for hematoma  evacuation which she preferred to avoid and she was simply advised to  follow up next Thursday as well as call us if there is any changes in  the site.  She reports this evening that she noticed just a small amount  of oozing, again, around the Steri-strips stating that this was similar  to the amount that was seen on the 26th when she was in [facility].  She has not had any pain and notes that actually the swelling  around the site has gone down.  She denies any heat or erythema and she  has not been febrile.  I again gave her the option of coming into the  emergency room for closer evaluation and possible admission for hematoma  evacuation, or if she is asymptomatic as she describes herself to be,  that she could stay home and follow up in clinic on Thursday as  scheduled with the caveat being that if she has any worsening of pain,  development of a fever, worsening of bleeding or swelling that she needs  to come into the ER for evaluation.  She says that she is otherwise  asymptomatic, just noticed the small amount of oozing and prefers to  stay home and follow up on Thursday.  She was grateful for the  call  back.     Morgan Clay, ANP  Electronically Signed    CB/MedQ  DD: 01/03/2007  DT: 01/04/2007  Job #: [id number]

## 2010-09-20 NOTE — Assessment & Plan Note (Signed)
[facility]                          CHRONIC HEART FAILURE NOTE   Morgan, Clay                    MRN:          [id number]  DATE:01/01/2007                            DOB:          01-01-1946    Morgan Clay returns today for followup of her congestive heart failure,  which is secondary to nonischemic cardiomyopathy, most likely due to  poorly-controlled hypertension in the past.  Most recent echocardiogram  showed an ejection fraction of 25-30%, previously improved from 15%.  However, Morgan Clay continued to have class III New York Heart  Association symptoms without volume overload.  I have referred her to  Dr. Caryl Comes for further evaluation, and the patient ultimately was  admitted to [facility] for implantation of a Medtronic Concerto cardiac  resynchronization therapy defibrillator.  She tolerated the procedure  with complaint of some soreness at the incision site and some nausea  with pain medications.  She also developed some moderate hematoma  postoperatively.  Ultimately was discharged home on August 20 with pain  medication and a followup appointment here in the [facility],  and wound check.  Morgan Clay states that since being  home, she  continues to have a significant amount of pain around the incision site.  The pain medication that she was discharged home on (Darvocet) does not  seem to be alleviating the discomfort.  She states that she cannot sleep  because she cannot get comfortable at night.  Energy level is still  about the same as prior to her device, but she states she really has not  done anything because she has been so sore and uncomfortable she has not  really attempted to do anything.  Denies any shortness of breath at  rest, orthopnea, or PND.  No pre-syncope or syncopal episodes.  No  firing from her defibrillator.   PAST MEDICAL HISTORY:  1. Includes congestive heart failure secondary to nonischemic    cardiomyopathy in the setting of a previous history of poorly      controlled hypertension and a remote history of EtOH abuse.  Most      recent echocardiogram done prior to this hospitalization, EF now      back to 60%.  Previous ejection fraction as low as 15%.  2. Status post cardiac catheterization showing normal coronaries.  3. Moderately to severely elevated pulmonary pressures.  4. Previous hypothyroidism treated with Iodine 131 resulting in      iatrogenic hypothyroidism, currently managed by Dr. Jobe Igo.  5. History of poorly controlled hypertension.  6. Left bundle branch block.  7. Questionable history of COPD asthma requiring the use of inhaler      p.r.n.  8. History of tobacco use.  9. History of EtOH abuse.   REVIEW OF SYSTEMS:  As stated above.   CURRENT MEDICATIONS:  1. Enteric-coated aspirin 81 mg daily.  2. Lisinopril 40 mg daily.  3. Furosemide 40 mg b.i.d.  4. KCl 20 mEq daily.  5. Coreg 25 mg b.i.d.  6. Hydralazine 25 mg b.i.d.  7. Spironolactone 25 mg daily.  8. Imdur 90 mg daily.  PHYSICAL EXAM:  Weight 148 pounds, blood pressure 104/63 with a heart  rate of 62.  Morgan Clay is in no acute distress.  However, she is extremely  uncomfortable in her left chest.  She has no jugular venous distension at a 45 degree angle.  LUNGS:  Clear to auscultation.  CARDIOVASCULAR:  Reveals an S1, S2.  A 2/6 systolic ejection murmur  noted.  Left chest, Steri-Strips dry and intact to ICD site.  The patient has a  large hematoma that is tender to touch.  ABDOMEN:  Soft and nontender.  Positive bowel sounds.  LOWER EXTREMITIES:  Without cyanosis, clubbing, or edema.  NEUROLOGIC:  Alert and oriented x3.   IMPRESSION:  Congestive heart failure secondary to nonischemic  cardiomyopathy.  The patient appears to be euvolemic at this time.  Will  continue current medications.  The patient's device to be interrogated  today and also have Dr. Lovena Le take a look at the  hematoma to her  implantable cardioverter defibrillator site.  The patient will need a  followup appointment with Dr. Lovena Le for further evaluation.      [name], ACNP  Electronically Signed      Shaune Pascal. Bensimhon, MD  Electronically Signed   MB/MedQ  DD: 01/01/2007  DT: 01/01/2007  Job #: [id number]

## 2010-09-20 NOTE — Assessment & Plan Note (Signed)
[place] OFFICE NOTE   EMSLEE, COLLENS                    MRN:          [id number]  DATE:01/10/2007                            DOB:          Jun 19, 1945    Morgan Clay was seen today in the clinic on January 10, 2007, for a  recheck of her wound.  She had a large hematoma with a pocket evacuation  done on August 30.  Today, hematoma is resolving.  Her Steri-Strips are  still intact.  I instructed her to leave them on and let them fall off  at will.  The area is soft and the patient states that she is feeling  much better with this.   FOLLOWUP:  She has a return office visit for November with Dr. Caryl Comes.      Alma Friendly, LPN  Electronically Signed      [name], MD, [facility]  Electronically Signed   PO/MedQ  DD: 01/10/2007  DT: 01/10/2007  Job #: [id number]

## 2010-09-20 NOTE — Assessment & Plan Note (Signed)
[facility]                          CHRONIC HEART FAILURE NOTE   Morgan Clay, Morgan Clay                    MRN:          [id number]  DATE:03/06/2007                            DOB:          04/27/1946    PRIMARY CARDIOLOGIST:  Minus Breeding, M.D.   ELECTROPHYSIOLOGIST:  [name], M.D.   PRIMARY CARE PHYSICIAN:  Monia Sabal. Jobe Igo, M.D.   SUBJECTIVE:  Morgan Clay returns today for followup of her congestive  heart failure which is secondary to nonischemic cardiomyopathy, most  likely due to poorly controlled hypertension in the past.  I have not  seen Morgan Clay since August at which time I previously had referred her  to Dr. Caryl Comes for consideration of ICD/CRT implantation.  Morgan Clay had  this procedure done; unfortunately, developed a hematoma in her ICD/CRT  site that was very painful requiring blood extravasation/evacuation  lavage.  The patient was discharged home from [facility] on January 08, 2007.  She states she continues to have some pain around the ICD site  but otherwise is doing much better than previously.  She states she has  more energy than she has in several months.  She still gets fatigued  easily but states overall she just feels so much better.  She still has  some problems with insomnia and this  is mostly related to difficulty  getting comfortable at night and sleeping without putting pressure on  her ICD site.  Morgan Clay has also had some problems with hypotension.  She apparently had a home health nurse that was coming out to check on  her initially post-hospitalization and, some changes were made in the  patient's medication dose.  Overall, she denies any fever or chills,  orthopnea, PND.  She states she has been doing some walking without  getting short of breath.  Her appetite has been good.  In fact, her  weight is up since I saw her in August.  She has gained 7 pounds.   PAST MEDICAL HISTORY:  1. Congestive  heart failure secondary to nonischemic cardiomyopathy in      the setting of previously poor controlled hypertension.  2. Remote history of ETOH abuse.  3. Most recent echocardiogram done prior to recent hospitalization      shows an ejection fraction now back to 60%, previously as low as      15%.  4. Status post recent implantation of an CRT-D device complicated by a      pocket hematoma requiring evacuation lavage and antibiotic therapy.      The patient had a Medtronic Concerto CRTD placed on December 24, 2006.  5. Left bundle branch block.  6. History of hypertension.  7. Chronic obstructive pulmonary disease.  8. Treated hypothyroidism.  9. Status post cardiac catheterization showing normal coronaries.  10.Moderately to severely elevated pulmonary pressures.  11.Previous hypothyroidism treated with iodine 131 resulting in      iatrogenic hypothyroidism, currently managed by Dr. Jobe Igo.  12.History of tobacco use.   REVIEW OF SYSTEMS:  As stated above.   CURRENT MEDICATIONS:  Her  current medications are somewhat confusing.  The patient had changes made in her medications.  Apparently the home  health nurse called here and verbal instructions were given over the  phone to adjust some of her medications secondary to hypotension.  She  is taking:  1. Levothyroxine 100 mcg daily.  2. Aspirin 81 mg daily.  3. Imdur 90 mg daily.  4. Furosemide 40 mg b.i.d.  5. Lisinopril 40 mg daily.  6. Hydralazine 12.5 mg daily.  7. Carvedilol 25 mg b.i.d.   In previous documentation, post hospital visit, the patient previously  had been on spironolactone 25 mg daily which is on hold and previously  was on hydralazine 25 mg b.i.d. and Coreg 25 mg daily, according to  hospital discharge summary.  Previous documentation also states the  patient was on potassium chloride 20 mEq daily.   PHYSICAL EXAMINATION:  VITAL SIGNS:  Weight 155 pounds.  Blood pressure  142/70 with heart rate of 61.   GENERAL APPEARANCE:  Morgan Clay is in no acute distress.  She looks  very well today.  NECK:  No signs of jugular venous distention at 45 degree angle.  LUNGS:  Clear to auscultation throughout.  CARDIOVASCULAR:  Exam reveals an S1 and S2, regular rate and rhythm.  CRT site to left chest well healed granulated tissue without hematoma.  SKIN:  Warm and dry.  ABDOMEN: Soft, nontender.  Positive bowel sounds.  EXTREMITIES:  Lower extremities without cyanosis, clubbing or any signs  of edema.  NEUROLOGICAL:  She is alert and oriented x3.   IMPRESSION:  Congestive heart failure secondary to nonischemic  cardiomyopathy with previously poorly controlled hypertension.  Apparently Morgan Clay has had problems with hypotension since I last saw  her.  Medications list I would like to revise.  Will continue the  furosemide 40 mg p.o. b.i.d. I am going to cut her isosorbide  mononitrate back to 30 mg daily, as this is a long-acting form. Her  hydralazine would be most beneficial t.i.d. so I am going to have her  change it to 12.5 mg, which will be one-half of a tablet three times a  day.  We will continue her carvedilol 25 mg at twice a day, which is  what she says she has been taking; although, the previous discharge  summary states once a day since she does not have extended release.  I  am going to resume her potassium at  20 mEq b.i.d. as she is not on  spironolactone right now and her last potassium was 3.8 in September.  Her kidney function was stable at that time at 25 and 1.2.  I would like  to see Morgan Clay back in three to four weeks for re-evaluation of her  blood pressure and I will check laboratory work again at that time.  I  have asked her to check her blood pressure at home and call me with her  numbers.   In review, she says her primary care physician now is Dr. Werner Lean.  I  cannot locate a physician named Dr. Werner Lean.      [name], ACNP  Electronically  Signed      Shaune Pascal. Bensimhon, MD  Electronically Signed   MB/MedQ  DD: 03/06/2007  DT: 03/07/2007  Job #: [id number]   cc:   Monia Sabal. Jobe Igo, M.D.  [name], MD, [facility]  Minus Breeding, MD, [facility]

## 2010-09-20 NOTE — Assessment & Plan Note (Signed)
[facility]                          CHRONIC HEART FAILURE NOTE   JACQUETTE, Morgan Clay                    MRN:          [id number]  DATE:12/04/2006                            DOB:          06/06/1945    Ms. [name] returns today for followup of her congestive heart failure  which is secondary to nonischemic cardiomyopathy in a setting of a  history of poorly controlled hypertension. Mrs. [name] has continued to  present with Class III symptoms over the last few months.  I arranged  for her to undergo a CPX test and earlier this month she was found to  have moderate limitations secondary to her heart failure.   INCOMPLETE DICTATION      [name], ACNP       Shaune Pascal. Bensimhon, MD    MB/MedQ  DD: 12/04/2006  DT: 12/05/2006  Job #: [id number]

## 2010-09-20 NOTE — Assessment & Plan Note (Signed)
[place] OFFICE NOTE   KELIE, WHITSETT                    MRN:          [id number]  DATE:02/13/2007                            DOB:          November 18, 1945    The patient is seen following CRT implantation in August.  She is  feeling much better.  Her breathing is much improved.  She does have  some soreness over her defibrillator site.   When we saw her last month, she was having significant problems with  lightheadedness.  We undertook a significant modification of her regime,  cutting her Coreg in half down to 12.5 mg b.i.d., cutting her Lisinopril  down from 40 to 20, cutting her Hydralazine down to 12.5 mg b.i.d.  These have all been associated with a marked improvement in her  lightheadedness.  Other medications include IMDUR 90 and the lisinopril  has been re-up-titrated to 40.   PHYSICAL EXAMINATION:  VITAL SIGNS:  Today blood pressure 112/70, pulse  71.  LUNGS:  Clear.  HEART:  Sounds were regular.  There was a little bit of extruded suture  that was evident.  Removing this, it was a long strand of 4-0 suture.  There was a little bit of erythema at the lateral aspect of the wound  and hopefully this will resolve.  EXTREMITIES:  Without edema.   Interrogation of her Medtronic Concerto device demonstrates good  threshold of 0.5 at 0.4 in all chambers.  The P wave was 3.8 with  impedance of 488, R wave was 16.2 with impedance of 472, and LV  impedance of 464.  There were no intercurrent therapies.  The device was  reprogrammed.   IMPRESSION:  1. Congestive heart failure - class III, now class II.  2. Nonischemic cardiomyopathy.  3. Retained suture.  4. Hypotension - improved.  5. Anxiety/sleep disorder.   The patient is doing well from a heart failure point of view.  I have  asked her to get back in touch with the heart failure clinic.  Unfortunately her relationship with her primary  care physician has  broken up as Monia Sabal. Jobe Igo, M.D. has moved.  Some sleep consultation,  perhaps psychological consultation with behavioral medicine, and  anxiolysis might be helpful for her and I will defer this to her primary  care doctor and [name], ACNP from the heart failure clinic.     [name], MD, [facility]  Electronically Signed    SCK/MedQ  DD: 02/13/2007  DT: 02/14/2007  Job #: [id number]

## 2010-09-20 NOTE — Discharge Summary (Signed)
Morgan Clay, Morgan Clay NO.:  [id number]   MEDICAL RECORD NO.:  [id number]          PATIENT TYPE:  INP   LOCATION:  2004                         FACILITY:  [facility]   PHYSICIAN:  Sueanne Margarita, PA   DATE OF BIRTH:  March 08, 1946   DATE OF ADMISSION:  01/05/2007  DATE OF DISCHARGE:  01/08/2007                               DISCHARGE SUMMARY   ALLERGIES:  NO KNOWN DRUG ALLERGIES.   FINAL DIAGNOSES:  1. Pocket hematoma with blood extravasation.  2. Discharging day three status post evacuation/lavage and hemostasis      of device pocket hematoma.  3. Discharging on 7 days oral antibiotic after receiving 4 days of IV      antibiotics at [place]:  1. New York Heart Association class III chronic, diastolic congestive      heart failure.  2. Implant of Medtronic Concerto CRT - D on December 24, 2006,      discharging with moderate pocket hematoma.  3. Nonischemic cardiomyopathy.  4. Left bundle branch block.  5. Hypertension.  6. Chronic obstructive pulmonary disease.  7. Treated hypothyroidism.  8. Echocardiogram December 20, 2006 ejection fraction 60%, severe      diastolic dysfunction.   PROCEDURE:  January 05, 2007, urgent pocket evaluation for ongoing  moderate hematoma at the device insertion site with active blood  extravasation.  The patient will receive IV antibiotics postop as well  as oral antibiotics continued at home.   BRIEF HISTORY:  Morgan Clay is a 65 year old female.  She has a  longstanding history of nonischemic cardiomyopathy.  A catheterization 2  years ago was negative for obstructive coronary artery disease.  She has  New York Heart Association class III chronic diastolic congestive heart  failure.   The patient also has left bundle branch block with first-degree AV  block.  The patient underwent implantation of a cardioverter-  defibrillator with left ventricular cardiac resynchronization lead  implanted  December 24, 2006.  It was noted that the patient did have a  moderate hematoma at the implant site even though the patient has not  been on anticoagulation.  Prior to discharge, pressure was held.  The  patient was discharged with a full list of medications for therapy for  congestive heart failure.  She had close follow-up and it was noted at  the office visit that she had extravasation of active oozing from the  pocket site which was markedly swollen.  She also had diaphragmatic  stimulation which was corrected during the office visit at [facility].  She was admitted to the hospital January 05, 2007, for urgent  pocket evacuation, hemostasis and copious antibiotic lavage, followed by  IV antibiotics.   HOSPITAL COURSE:  The patient presented on January 05, 2007.  As  indicated above, the patient tolerated the procedure of pocket  evacuation, hemostasis and lavage well.  She has been on IV antibiotics  throughout her stay here with IV Ancef.  After the procedure, the  patient had no evidence of fresh bleeding.  Blood clot was removed about  100 to  150 mL and then hemostasis was effected for the next 30 minutes.  There was erosion of a prepectoral fascia which was bleeding.  Pressure  was held, Bovie anticoagulation was used and the pocket was revised.  The patient is discharging January 08, 2007, day three after  evacuation, lavage and hemostasis.  The pocket shows mild swelling  without any progressive recurrent hematoma.  The patient is asked to  keep her incision dry for the next 7 days, to sponge bathe until  Saturday January 12, 2007.   DISCHARGE MEDICATIONS INCLUDE:  1. New medication antibiotic Keflex 500 mg to take 1/2 hour before      breakfast, lunch, dinner and bedtime for 7 days.  2. Enteric-coated aspirin 81 mg daily.  3. Lisinopril 40 mg daily.  4. Furosemide 40 mg twice daily.  5. Coreg 25 mg twice daily.  6. Levothyroxine 100 mcg daily.  7. A new dose  hydralazine 25 mg twice daily.  This is a new dose.  Her      old dose was 50 mg t.i.d.  8. Isosorbide mononitrate 90 mg daily, a new dose, reduced from 120 mg      daily.  9. Spironolactone 25 mg daily.  10.The patient goes home with a prescription for pain Percocet 5/325      mg 1 to 2 tablets every 4 to 6 hours as needed.  11.She is to use her albuterol inhaler as needed.  12.The patient has also been given prescriptions for the new doses of      medications, hydralazine and isosorbide.   FOLLOW-UP:  She has follow-up with [place] [place]:  1. Incision check Thursday January 10, 2007 at 9 o'clock.  A basic      metabolic panel will be taken.  2. To see [name], MD, [facility] physician assistant on Thursday      January 24, 2007 at 4.00.  3. See [name], MD, [facility] Thursday March 14, 2007 at 8:45.   LABORATORY FINDINGS:  Complete blood count this admission:  Hemoglobin  12, hematocrit 36.1, white cells 7.6 and platelet count of 335,000.  Serum electrolytes on the day of discharge January 08, 2007:  Sodium  134 potassium 3.7, chloride 99, carbonate 27, BUN is 39, creatinine  1.10, glucose is 100, protime this admission was 13.1, INR 1.   The patient had been on potassium chloride 20 mEq daily.  The patient  has not been on that dose here and potassium is maintained fairly well.  She will be asked that she does not need to take potassium going out  from this hospitalization.   TIME SPENT ON DISCHARGE:  Greater than 35 minutes for exam.      Sueanne Margarita, PA     GM/MEDQ  D:  01/08/2007  T:  01/08/2007  Job:  [id number]   cc:   Monia Sabal. Jobe Igo, M.D.  [name], MD, [facility]

## 2010-09-20 NOTE — Procedures (Signed)
Morgan Clay, Morgan Clay NO.:  [id number]   MEDICAL RECORD NO.:  [id number]          PATIENT TYPE:  OUT   LOCATION:  SLEEP CENTER                 FACILITY[facility]   PHYSICIAN:  Kathee Delton, MD,FCCPDATE OF BIRTH:  May 28, 1945   DATE OF STUDY:  10/09/2007                            NOCTURNAL POLYSOMNOGRAM   REFERRING PHYSICIAN:  Minus Breeding, MD, [facility]   INDICATION FOR STUDY:  Hypersomnia with sleep apnea.   EPWORTH SLEEPINESS SCORE:  9   MEDICATIONS:   SLEEP ARCHITECTURE:  The patient had total sleep time of 327 minutes  with no slow-wave sleep and decreased quantity of REM.  Sleep onset  latency was normal at 12 minutes and REM onset was prolonged at 111  minutes.  Sleep efficiency was decreased at 83%.   RESPIRATORY DATA:  The patient was found to have 7 apneas and 52  hypopnea, giving her an apnea-hypopnea index of 11 events per hour.  She  also had 46 respiratory effort related arousals, giving her a  respiratory disturbance index of 19 events per hour.  The events were  not positional and there was loud snoring noted throughout.  The patient  did not meet split-night criteria secondary to most of her events  occurring well after 1 a.m.   OXYGEN DATA:  There was O2 desaturation as low as 81% with the patient's  obstructive events.   CARDIAC DATA:  The patient was noted to have a paced rhythm with  frequent PVCs.   MOVEMENT-PARASOMNIA:  There were no significant leg jerks or abnormal  behaviors noted.   IMPRESSIONS-RECOMMENDATIONS:  1. Mild obstructive sleep apnea with an AHI of 11 events per hour, and      an RDI of 19 events per hour.  There was O2 desaturation as low as      81%.  The patient did not meet split-night criteria secondary to      most of her events occurring after 1 a.m.  Treatment for this      degree of sleep apnea can include weight loss alone if applicable,      upper airway surgery, oral appliance and also CPAP.  Clinical   correlation is suggested.  2. Paced ventricular rhythm with frequent PVCs noted.      Kathee Delton, MD,FCCP  Diplomate, [place] Board of Sleep  Medicine  Electronically Signed     [facility]/MEDQ  D:  10/29/2007 08:00:52  T:  10/29/2007 08:32:00  Job:  [id number]

## 2010-09-20 NOTE — Assessment & Plan Note (Signed)
[facility]                          CHRONIC HEART FAILURE NOTE   Morgan Clay, Morgan Clay                    MRN:          [id number]  DATE:01/07/2008                            DOB:          May 12, 1945    PRIMARY CARDIOLOGIST:  Minus Breeding, MD, [facility]   PRIMARY CARE PHYSICIAN:  [facility].   ELECTROPHYSIOLOGY:  [name], MD, [facility]   PULMONOLOGY:  Kathee Delton, MD,FCCP   Morgan Clay returns today for further followup of her congestive heart  failure, which is secondary to nonischemic cardiomyopathy.  Since I last  saw Morgan Clay, she has been set up with her CPAP machine for her mild  obstructive sleep apnea.  She states she is sleeping much better and  feels more alert during the day.  She also is followed up with Dr. Caryl Comes  for EP visit.  Status post her CRT implant, no changes were made in her  device and she is to follow up with Dr. Caryl Comes in 1 year.  Morgan Clay  states she has been doing well.  She denies orthopnea, PND, presyncope,  syncopal episodes, and any firing from her device.  She continues to try  to exercise.  She states someway she will walk 3 times a week and some  time she just walks twice a week.  She has had some intermittent  dizziness, but she describes dizziness as she is spinning when she is  lying in bed at night.  This has been intermittent, otherwise, she  states compliance with medications and no new problems.   PAST MEDICAL HISTORY:  1. Congestive heart failure secondary to nonischemic cardiomyopathy in      the setting of poorly controlled hypertension in the past with EF      previously 15%, now 60% by echocardiogram in 2008.  2. Status post CPX test in 2008, showing submaximal aerobic effort,      mild functional limitations due to primarily or circulatory      limitation.  3. Remote history EtOH abuse.  4. Situational depression.  5. Mild obstructive sleep apnea with initiation of  CPAP.  6. Status post implantation of a CRT device followed by Dr. Caryl Comes.      The patient has a Licensed conveyancer.  7. Left bundle-branch block.  8. Hypertension.  9. COPD.  10.Treated hypothyroidism.  11.Status post cardiac catheterization showed normal coronaries with      moderately to severe elevated pulmonary pressures.  12.History of tobacco use.   REVIEW OF SYSTEMS:  As stated above, otherwise, negative.   CURRENT MEDICATIONS:  1. Carvedilol 25 mg b.i.d.  2. Klor-Con 20 mEq b.i.d.  3. Aspirin 81 mg daily.  4. Lisinopril 40 mg daily.  5. Levothyroxine 100 mcg daily.  6. Isosorbide 60 mg daily.  7. Zoloft 100 mg daily.  8. Lasix 40 mg daily.  9. Hydralazine 25 mg 2 tablets t.i.d.  10.Spironolactone 25 mg daily.   P.r.n. medications include Ventolin and trazodone.   PHYSICAL EXAMINATION:  VITAL SIGNS:  Weight 164 pounds, weight is up 4  pounds today, blood pressure 185/81  and heart rate of 59.  GENERAL:  Morgan Clay is in no acute distress.  NECK:  No signs of jugular vein distention at 45 degrees angle.  LUNGS:  Clear to auscultation.  CARDIOVASCULAR:  S and S2 regular rate and rhythm.  ABDOMEN:  Soft, nontender, and positive bowel sounds.  LOWER EXTREMITIES:  Without clubbing, cyanosis or edema.  NEUROLOGICAL:  Alert and oriented x3.   IMPRESSION:  Congestive heart failure secondary to nonischemic  cardiomyopathy with elevated blood pressure at this time.  I am going to  increase Morgan Clay's spironolactone to 25 mg b.i.d.  Arranged for her to  have follow up blood work and a 2-D echocardiogram next month as it had  been over a year since, her last echocardiogram as she continues to  complain of.      Morgan Clay, ACNP  Electronically Signed      Minus Breeding, MD, [facility]  Electronically Signed   MB/MedQ  DD: 01/07/2008  DT: 01/08/2008  Job #: [id number]

## 2010-09-20 NOTE — Letter (Signed)
December 20, 2006    Morgan Clay, [address] [place], [place] [address]   RE:  Morgan Clay, Morgan Clay  MRN:  [id number]  /  DOB:  12/20/2004   Dear Morgan Clay:   Thank you for asking [name] to see Morgan Clay in consultation for  consideration of device implantation.   As you know, she is  65 year old African American woman who has a  longstanding history of a nonischemic cardiomyopathy with negative  catheterization about two years ago and chronic class III congestive  heart failure.  She does not have orthopnea.  She does have occasional  nocturnal dyspnea.  She has intermittent peripheral edema but she is  short of breath walking 100-200 feet and through her activities of daily  living.   She has had no syncope.  She does have some palpitations.   PAST MEDICAL HISTORY:  In addition to the above, is notable for  1. Hypertension.  2. COPD.  3. Moderate pulmonary hypertension.  4. Treated hypothyroidism with iatrogenic hypothyroidism.   PAST SURGICAL HISTORY:  Notable for appendectomy.   SOCIAL HISTORY:  She is single.  She has two children, one surviving,  her son having been killed in a drive-by shooting about 10 years ago.  He does not use cigarettes or recreational drugs.  She does drink  alcohol.  She is not working currently.  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   REVIEW OF SYSTEMS:  Broadly negative apart from things that are noted  previously.   FAMILY HISTORY:  Noncontributory.   CURRENT MEDICATIONS:  1. Aspirin 81.  2. Lisinopril 40.  3. Hydralazine 25 t.i.d.  4. Albuterol a couple of puffs a day.  5. Lasix 40 b.i.d.  6. Potassium 20 daily.  7. Synthroid 100 mcg.  8. Coreg 25 b.i.d.   ALLERGIES:  She is allergic to PERCOCET.   PHYSICAL EXAMINATION:  VITAL SIGNS:  Her blood pressure was elevated  still today at 158/89.  GENERAL APPEARANCE:  She is a middle-aged [place] American female  appearing her stated age of 32.  HEENT:  Otherwise  unremarkable.  NECK:  Her neck veins were 7-8 cm.  Her carotids were brisk and full  bilaterally without bruits.  BACK:  Without kyphosis or scoliosis.  LUNGS:  Clear.  CARDIOVASCULAR:  Heart sounds were regular with a displaced PMI.  No  significant murmur was appreciated.  ABDOMEN:  Soft with active bowel sounds, without midline pulsation or  hepatomegaly.  VASCULAR:  Femoral pulses were 2+, distal pulses were intact.  EXTREMITIES:  There was no clubbing, cyanosis, or edema.  NEUROLOGIC:  Grossly normal.  SKIN:  Warm and dry.   Electrocardiogram dated today demonstrated sinus rhythm at 56 with  intervals of 0.25/0.14/0.48.  The axis was leftward, borderline at -8.   IMPRESSION:  1. Chronic congestive systolic heart failure.  2. Left bundle branch block with first degree AV block.  3. Nonischemic cardiomyopathy.  4. Hypertension - poorly controlled.  5. Chronic obstructive pulmonary disease.   Ms. Morgan, Clay, clearly is an appropriate candidate for  consideration of ICD and in fact CRTD implantation.  I have reviewed  these potential benefits with you as well as potential risks including  but not limited to death, perforation, infection, lead dislodgement and  device malfunction.  She understands these risks and would like to  proceed.   In addition, I have spoken with Dr. Percival Spanish concerning her medical  regime and we have agreed to  add isosorbide mononitrate at 60 mg.  In  the event that she tolerates this, we should consider the addition of  spironolactone which we can undertake at hospitalization.   Thank you for this consultation.    Sincerely,      [name], MD, [facility]  Electronically Signed    SCK/MedQ  DD: 12/20/2006  DT: 12/21/2006  Job #: [id number]

## 2010-09-20 NOTE — Discharge Summary (Signed)
NAMEALEY, HEMMER NO.:  [id number]   MEDICAL RECORD NO.:  [id number]          PATIENT TYPE:  INP   LOCATION:  2035                         FACILITY:  [facility]   PHYSICIAN:  [name], MD, FACCDATE OF BIRTH:  Jul 19, 1945   DATE OF ADMISSION:  12/24/2006  DATE OF DISCHARGE:  12/26/2006                               DISCHARGE SUMMARY   ALLERGY:  PERCOCET.   DISCHARGE PROCESS:  Greater than 35 minutes.   FINAL DIAGNOSES:  1. Discharging day #1, status post implantation of Medtronic Concerto      cardiac resynchronization therapy defibrillator.  2. Nonischemic cardiomyopathy, ejection fraction 60% at      echocardiogram, December 20, 2006.  3. New York Heart Association class III chronic diastolic congestive      heart failure.  4. Poorly controlled hypertension with medication adjustment this      admission.   SECONDARY DIAGNOSES:  1. Chronic obstructive pulmonary disease.  2. Treated hypothyroidism.  3. Left bundle branch block.   PROCEDURE:  December 24, 2006, implant of Medtronic Concerto dual-chamber  cardioverter defibrillator with cardiac resynchronization lead,  defibrillator threshold study less than or equal to 20 joules, Dr.  Virl Axe.  The patient had moderate hematoma postprocedure.   BRIEF HISTORY:  Ms. Un is a 65 year old female.  She has a  longstanding history of nonischemic cardiomyopathy.  A catheterization  about 2 years ago was negative for obstructive coronary artery disease.  She has New York Heart Association chronic class III diastolic  congestive heart failure.   The patient does not have orthopnea.  She does have occasional PND.  She  has intermittent peripheral edema.  She is short of breath walking 100-  200 feet.  She has had no history of syncope.  She does have some  palpitation.   The patient also has left bundle branch block with first-degree AV block  and the patient is an appropriate candidate for  Cardiac  resynchronization lead with cardioverter defibrillator implant.  The  benefits and risks have been described to the patient and she  understands these and wishes to proceed.  In addition, the patient has  ongoing hypertension and medication adjustments will be attempted with  blood pressure and telemetry monitoring this admission.   HOSPITAL COURSE:  The patient presented electively on August 18.  She  underwent implant of the cardioverter defibrillator with left  ventricular resynchronization lead in place, Dr. Virl Axe.  The  patient did have elevated blood pressures of A999333 systolic and in the  postprocedural period, her medications have been adjusted.  She is  maintaining on Coreg 25 mg twice daily and lisinopril 40 mg daily.  Imdur 120 mg daily has been added as well as spironolactone 25 mg.  Her  hydralazine has been increased from 25 mg t.i.d. to 50 mg t.i.d.  The  patient complains of soreness at the incision site and nausea with pain  medications, but she would prefer to have her soreness treated; her  Vicodin has been downgraded to Darvocet on postprocedure day #2.  She is  A-sensing/V-pacing.  She is in sinus  rhythm.  She had a moderate  hematoma postop that pressure was held for about 12-15 minutes after the  procedure on postprocedure day #1.  At discharge, the patient is asked  to keep her incision dry for the next 7 days, to sponge bathe until  Monday, August 25.  She is to avoid driving for 1 week.  Mobility of the  left arm has been discussed with the patient.   DISCHARGE MEDICATIONS:  1. Enteric-coated aspirin 81 mg daily.  2. Lisinopril 40 mg daily.  3. Furosemide 40 mg twice daily.  4. Potassium chloride 20 mEq daily.  5. Coreg 25 mg twice daily.  6. Levothyroxine 100 mcg daily.  7. Hydralazine 50 mg t.i.d., a new dose.  8. Isosorbide mononitrate 120 mg daily, a new medication.  9. Spironolactone 25 mg, a new medication.  10.For pain, Darvocet-N 100 one to two  tablets every 4-6 hours as      needed.  11.For her COPD, albuterol metered-dose inhaler as needed.   FOLLOWUP:  She has followup at [facility], [address]  1. To see nurse practitioner Stevie Kern, Tuesday, August 26 at 9:40.  2. ICD clinic, Thursday, September 4, at 8:30, for basic metabolic      panel and then to see the clinic personnel.  3. To see Dr. Caryl Comes, Thursday, November 6, at 8:45.   LABORATORY STUDIES PERTINENT TO THIS ADMISSION:  Complete blood count:  White cell count 5.5, hemoglobin 13.4, hematocrit 40, platelets 148,000.  Serum electrolytes:  Sodium 136, potassium 3.6, chloride 103,  bicarbonate 26, BUN is 15, creatinine 0.98, glucose is 115.      Sueanne Margarita, [place]      [name], MD, [facility]  Electronically Signed    GM/MEDQ  D:  12/26/2006  T:  12/27/2006  Job:  [id number]   cc:   [name], ACNP  Monia Sabal Jobe Igo, M.D.

## 2010-09-20 NOTE — Assessment & Plan Note (Signed)
[facility] FAILURE NOTE   Morgan Clay, Morgan Clay                    MRN:          [id number]  DATE:10/08/2006                            DOB:          1945-09-03    PRIMARY CARE:  Monia Sabal. Jobe Igo, M.D.   PRIMARY CARDIOLOGIST:  Minus Breeding, M.D.   Morgan Clay returns today for followup of her congestive heart failure  which is secondary to nonischemic cardiomyopathy most likely due to  hypertension.  Most recent echocardiogram showing an EF of 15%.  [name] complains of ongoing shortness of breath with minimal exertion,  increased fatigue, and weakness in legs with mild exertion.  Appetite  has been good.  Denies any orthopnea or PND.  She does, however,  continue to smoke cigars.  She averages one cigar a day, or actually,  five cigars a week.  She also continues to consume EtOH.  She has two  glasses of red wine each evening.  She states her nerves are so on edge  because of her health problems that little things seem to really make  her irritable.  I discussed with her the possibility of maybe using an  antidepressant through the next few months to help her work through this  difficult time.  She will need to follow up with Dr. Jobe Igo regarding  that.  Otherwise, her weight is up today but she states she does not  have any extra fluid on-board, but she has been eating better because  she has been feeling better.   PAST MEDICAL HISTORY:  1. Congestive heart failure secondary to nonischemic cardiomyopathy in      the setting of poorly controlled hypertension, also with remote      history of liquor abuse, currently consuming two glasses of wine      each evening.  Most recent echocardiogram shows an EF of 15%.  2. Status post cardiac catheterization showing normal coronaries.  3. Moderately to severely elevated pulmonary pressures by cardiac      catheterization.  4. Previous hyperthyroidism  treated with iodine 131, resulting in      iatrogenic hypothyroidism, currently managed by Dr. Jobe Igo.  5. History of poorly controlled hypertension.  6. Left bundle-branch block.  7. Questionable COPD or asthma requiring use of inhaler.   REVIEW OF SYSTEMS:  As stated above.   CURRENT MEDICATIONS:  1. Aspirin 81 mg daily.  2. Lisinopril 40 mg daily.  3. Hydralazine 25 mg t.i.d.  4. Albuterol inhaler one puff t.i.d. p.r.n.  5. Lasix 40 mg b.i.d.  6. KCl 20 mEq b.i.d.  7. Levothroid 75 mcg daily.  8. Coreg 25 mg b.i.d.   Most recent lab work done August 31, 2006, showed a potassium of 3.5  which was addressed, BUN and creatinine 17 and 1.0.   PHYSICAL EXAMINATION:  VITAL SIGNS:  Weight 135 pounds.  Weight is up 6  pounds from April.  Blood pressure 102/69 with a heart rate of 70.  GENERAL:  Morgan Clay is no acute distress, very pleasant, cooperative,  usual self.  No signs of jugular venous  distention at a 45-degree angle.  LUNGS:  Clear to auscultation.  CARDIOVASCULAR:  Reveals an S1 and S2.  No S3 heard at this time.  ABDOMEN:  Soft, nontender, positive bowel sounds.  LOWER EXTREMITIES:  Without clubbing, cyanosis, or edema.  NEUROLOGIC:  The patient alert and oriented x3.  Cranial nerves II-XII  grossly intact.   Walk test:  We ambulated approximately 150 feet before Morgan Clay had to  stop secondary to increased shortness of breath and extreme weakness in  her lower extremities.   PLAN TODAY:  Continue current medications.  I would like to go ahead and  repeat the 2-D echocardiogram for further evaluation of her EF, which  had gone from 20-25% down to 15% during last echocardiogram.  Daphane  is still presenting with class III symptoms without sign of volume  overload.  Will also check lab work today and hopefully her hypokalemia  is stable.  If no improvement in ejection fraction, we will refer to Dr.  Caryl Comes for further evaluation.  I have also once again reminded  Morgan Clay  that she will need to decrease her EtOH and tobacco use for improved  outcome.   There is apparently some confusion as to who the patient's primary care  physician is.  She has seen Dr. Laney Pastor over at [facility] in the  past, but also apparently seeing Dr. Lysle Pearl also.  Will clarify  who exactly to refer to at next appointment.      [name], ACNP  Electronically Signed      Minus Breeding, MD, [facility]  Electronically Signed   MB/MedQ  DD: 10/08/2006  DT: 10/08/2006  Job #: [id number]

## 2010-09-20 NOTE — Assessment & Plan Note (Signed)
[facility] OFFICE NOTE   Morgan, Clay                    MRN:          [id number]  DATE:01/24/2007                            DOB:          1945-10-16    No known drug allergies.   Morgan Clay is a 65 year old female who had a Medtronic Concerto CRT-D  implanted on August 18th.  She had a moderate pocket hematoma.  She was  brought back for evaluation, lavage, and hemostasis of the hematoma on  August 30th.  She has had an interim office visit with Dr. Caryl Comes,  checking the hematoma and she is now having a 2nd visit today, September  18th.  The incision is healing nicely.  There was still some induration  from old clot subcutaneously.  The patient is still having modest  discomfort from the ICD pocket but is able now to wear a bra and feels  comfortable with that.  She does say, however, that it does interrupt  her sleep at times and is asking for something for sleep.  I wrote her a  prescription for Ambien CR 12.5 mg q.h.s. p.r.n.  She also says that she  is still fatigued and short of breath.  It has been some time since she  has had her thyroid stimulating hormone level checked and I recommended  that she check that with Dr. Jobe Igo.  I also reviewed with her, her  electrocardiogram which showed that the QRS has been re-modeled quite  nicely with biventricular pacing.  She also complains that she has pain  across the gluteal region when she walks.  This is relieved with rest.  This is something I recommend she check up with Dr. Caryl Comes.  And, she  will see him on Wednesday, October 8 at 3:15.   Her medications have actually been radically rearranged recently because  of hypotension.  At this current time, they are:  1. Imdur 90 mg daily.  2. Lisinopril 20 mg daily, this is down from 40 mg daily.  3. Spironolactone has been discontinued.  4. Levothyroxine at 100 mcg daily.  This may change when she  sees Dr.      Jobe Igo.  5. Furosemide 80 mg, one half tablet twice daily.  6. Hydralazine 1/2 tablet of a 25 mg tablet twice daily.  7. Enteric coated aspirin 81 mg daily.  8. Coreg 25 mg b.i.d., now 12.5 mg b.i.d.  9. Once again, Ambien CR 12.5 mg q.h.s. p.r.n.  10.Vicodin as needed.  11.Ventolin inhaler as needed.   Once again, she follows up with Dr. Caryl Comes on Wednesday, October 8th at  3:15.  Perhaps at that time she has new news about her thyroid status  and also can describe a little bit more thoroughly her gluteal aches  with activity.      Sueanne Margarita, PA  Electronically Signed      Minus Breeding, MD, [facility]  Electronically Signed   GM/MedQ  DD: 01/24/2007  DT: 01/24/2007  Job #: [id number]

## 2010-09-23 NOTE — Assessment & Plan Note (Signed)
[facility]                          CHRONIC HEART FAILURE NOTE   Morgan Clay                    MRN:          [id number]  DATE:08/10/2006                            DOB:          1945-11-17    [name] returns today for followup of her congestive heart failure  which is secondary to nonischemic cardiomyopathy most likely secondary  to hypertension disease. She is status post cardiac catheterization in  2006 that showed normal coronaries with an EF of 20-25% at that time  with significant pulmonary hypertension. Recently admitted to [facility] for acute on chronic congestive heart failure exacerbation.  Repeat echocardiogram during that hospitalization showed the EF had  decreased to 15%. Mr. Manske states she has been tolerating the  medications without problems. She does complain of some increased  weakness and fatigue and mild orthostatic dizziness that resolves after  standing for a brief period of time. Her weight has been stable, denied  any peripheral edema. She does complain of mild abdominal bloating that  resolves when she takes her furosemide. She is trying to cut down on the  cigars that she smokes, previously smoked 2 a week and is down to 1  cigar a week now. One cigar generally lasts her for several days. Denies  any chest discomfort, orthopnea or PND.   PAST MEDICAL HISTORY:  1. Congestive heart failure secondary to nonischemic cardiomyopathy      most likely secondary to poorly controlled hypertension. Also with      a remote history of liquor abuse. Most recent ejection fraction of      15% by echocardiogram.  2. Status post cardiac catheterization in 2006 with normal coronaries.  3. Moderately to severely elevated pulmonary pressures by cardiac      catheterization in 2006.  4. Previous hyperthyroidism treated with iodine 131 resulting in      iatrogenic hypothyroidism currently managed by Dr. Jobe Igo.  5. Poorly controlled hypertension.  6. Left bundle branch block.  7. History of alcohol use in the form of liquor. Currently now      consuming 1 glass of red wine daily.  8. Tobacco use in the form of cigars.  9. Questionable COPD or asthma requiring use of an inhaler p.r.n.   REVIEW OF SYSTEMS:  As stated above.   CURRENT MEDICATIONS:  1. Aspirin 81.  2. Lisinopril 40 daily.  3. Hydralazine 25 t.i.d.  4. Albuterol 1 puff t.i.d.  5. Levothroid 0.05 mg daily.  6. Coreg 18.75 mg b.i.d.  7. Lasix 40 mg b.i.d.   PHYSICAL EXAMINATION:  VITAL SIGNS:  Weight 129. Initial blood pressure  143/80, repeat blood pressure manually 130/82 with a pulse of 53. A 12-  lead EKG obtained this morning shows sinus brady with a left bundle  branch block and left ventricular hypertrophy with early repolarization  at a rate of 53.  GENERAL:  [name] is in no acute distress.  NECK:  No jugular vein distention at 45 degree angle.  LUNGS:  Clear to auscultation bilaterally.  CARDIOVASCULAR:  Reveals a S1 and S2, regular rhythm, bradycardic,  2/6  systolic ejection murmur heard.  ABDOMEN:  Soft, nontender, positive bowel sounds.  EXTREMITIES:  Lower extremities without any clubbing, cyanosis or edema.  NEUROLOGIC:  Alert and oriented.   DIAGNOSTICS:  I had the patient walk with me attached to pulse oximetry.  Initial heart rate 52, oxygen level 97%. We walked approximately 60 feet  very slowly. At 60 feet the patient had to stop secondary to increased  weakness and fatigue. Her pulse ox maximum heart rate was 60. During  ambulating pulse ox maintained between 95 and 98%.   IMPRESSION:  [name] is very much class 3 New York Heart Association  symptoms at this time. Fluid volume appears to be stable. No room at  this time to titrate beta blocker secondary to brady rhythm. Will  continue current medications and check lab work today and followup with  patient in 3 weeks. We requested a copy of her  blood work be sent to Dr.  Jobe Igo.      [name], ACNP  Electronically Signed      Shaune Pascal. Bensimhon, MD  Electronically Signed   MB/MedQ  DD: 08/10/2006  DT: 08/10/2006  Job #: [id number]   cc:   Monia Sabal. Jobe Igo, M.D.

## 2010-09-23 NOTE — Discharge Summary (Signed)
NAMEVERLE, PATTAN NO.:  [id number]   MEDICAL RECORD NO.:  [id number]          PATIENT TYPE:  INP   LOCATION:  [id number]                         FAC[facility]   PHYSICIAN:  Glori Bickers, M.D. LHCDATE OF BIRTH:  09-19-1945   DATE OF ADMISSION:  08/04/2004  DATE OF DISCHARGE:  08/08/2004                           DISCHARGE SUMMARY - REFERRING   SUMMARY OF HISTORY:  Ms. Jakel is a 65 year old African-American female  who presents with CHF exacerbation. She was first diagnosed in 9 in  [place], [place] and has been followed closely there.  Several months ago  she moved from [place] to [place] and has been doing well  until this preceding Monday when she began to become progressively short of  breath and orthopneic.  She also developed a discomfort and right shoulder  that felt like severe arthritis radiating up into her back and jaw.  She  felt it was worse when cold air touched it and it did not get worse with  exertion.  Over the week, she became progressively dyspneic to the point she  would get short of breath just walking up one flight of stairs. She did not  have any associated peripheral edema with the this.  She went to the urgent  care saw Dr. Laney Pastor who contacted our office for evaluation.   PAST MEDICAL HISTORY:  Medications prior to admission are believed to  include Lasix, Prinivil and thyroid medication, unknown dosages.  History of  hypertension hyperthyroidism status post iodine ablation, obesity, left  bundle branch block of uncertain duration, questionable prior cardiac  catheterization.   LABORATORY DATA:  The H&H was 14.7 and 43.2, normal indices, platelets 193,  WBC 6.5.  Sodium 140, potassium 3.4, BUN 13, creatinine 1.0, glucose 115.  LFTs were slightly elevated with an AST of 93, ALT 81.  Prior to discharge,  sodium was 133, potassium 4.2, BUN 14, creatinine 1.1, glucose 92.  Hemoglobin A1c was 6.0. CK MBs were  negative x4, troponins were 0.03, 0.05,  0.05, 0.03. BNP on admission was 1481.5 and on the first 625.5. TSH was  elevated at 25.601.  Total cholesterol of 83, triglycerides 60, HDL 37, LDL  34.   HOSPITAL COURSE:  The patient was admitted to 4700 by Dr. Haroldine Laws.  Overnight she felt that her dyspnea had improved.  An echocardiogram was  performed on the 31st and showed an EF to be 15% with moderate AI, left  atrial dilatation, mild right ventricular dilatation, decreased RV systolic  function, mild to moderate TI, SEVERE LV DYSFUNCTION.  Dr. Stanford Breed felt  that she would most likely need an outpatient Myoview when CHF improves and  if her EF was decreased as well as an EP evaluation.  Dr. Stanford Breed notes  that the patient does not know what her thyroid medication dose is and she  will have a family member bring it in because this will need to be  increased.  However, during her hospitalization she never brought in the  medication and she was placed on Synthroid 25. Over the next several days  her diuresis continued and she gradually  became less short of breath. She  was started on digoxin, Coreg and IV Lasix.  Dr. Caryl Comes felt that she needed  an outpatient cardiac catheterization with EPS study plus/minus CRT. On  August 08, 2004, the patient was assessed by Mannie Stabile, P.A., and Dr.  Percival Spanish and felt that she could be discharged home.  On review with the  chart it was noted that there was not a chest x-ray from this admission,  there was a chest x-ray from June 15, 2004,  that showed cardiomegaly,  pulmonary venous hypertension and mild interstitial edema. Right shoulder x-  ray did not show any bony acute findings.   DISCHARGE DIAGNOSIS:  Congestive heart failure with probable dilated  cardiomyopathy based on echocardiogram, left bundle branch block,  hypertension, hyperthyroidism with iodine treatment. However, TSH is  currently elevated at this time indicative of hypothyroidism,  hypokalemia  resolved, history as previously.   DISPOSITION:  The patient is discharged home.   Her new medications include:  1.  Aspirin 81 mg daily.  2.  Digoxin 0.125 mg daily.  3.  Coreg 6.25 mg b.i.d.  4.  Her Prinivil was decreased from 20 mg to 10 mg daily  5.  Lasix was increased to 80 mg daily.  6.  She was asked to continue her thyroid medication as previously.   When she follows up with Dr. Percival Spanish in the CHF clinic on Tuesday, August 09, 2004, at 11:15 a.m. she was asked to bring all weights and all medicines to  all appointments.  At the time of follow-up in the CHF clinic tomorrow her  thyroid medication should be reviewed and she should be titrated up on the  medication given that her TSH is 25.6. She should also obtain a primary care  physician for future follow-up if this has not been done previously. She  will need a repeat TSH in approximately six to eight weeks after her  medication is adjusted. At the time of follow-up with Dr. Percival Spanish,  arrangements for an outpatient catheterization with EP study plus/minus CRT  will be arranged.      EW/MEDQ  D:  08/08/2004  T:  08/08/2004  Job:  [id number]   cc:   Minus Breeding, M.D.   Robert P. Laney Pastor, M.D.  [address]  [place]  [place] [address]  Fax: [phone]   [name], M.D.

## 2010-09-23 NOTE — Consult Note (Signed)
[name], STOOTS NO.:  [id number]   MEDICAL RECORD NO.:  [id number]          PATIENT TYPE:  INP   LOCATION:  [id number]                         FAC[facility]   PHYSICIAN:  Fay Records, MD, FACCDATE OF BIRTH:  11/27/1945   DATE OF CONSULTATION:  06/15/2006  DATE OF DISCHARGE:                                 CONSULTATION   CHIEF COMPLAINT:  Possible ICD secondary to nonischemic cardiomyopathy.   HISTORY OF PRESENT ILLNESS:  [name] is a 65 year old female with a  history of nonischemic cardiomyopathy.  She was admitted on June 10, 2006, for a cough which she has had for the last couple of months and is  non-productive.  Her breathing has been worse on and off, but just prior  to admission she stated she was coughing so constantly she was losing  her breath and getting very short of breath.  She was felt to have an  upper respiratory infection that was viral in nature as well as acute on  chronic systolic CHF.  Her symptoms have improved with treatment but she  still has significant shortness of breath.  An echocardiogram was  performed which showed an EF of approximately 15% and cardiology is  asked to see her in consideration for an ICD.   [name] gets occasional dizzy spells that are orthostatic in nature.  She also gets some slight dizziness occasionally while walking that is  associated with paroxysms of coughing and shortness of breath.  She also  has a history of occasional palpitations which are very brief and she is  asymptomatic with these.  She does not recall having any in the last few  weeks.  She has no significant history of chest pain.  Her respiratory  distress comes and goes.  She has no history of syncope or presyncope.  She has chronic orthopnea which is worse recently and she had some  swollen ankles and lower extremities prior to admission.   PAST MEDICAL HISTORY:  1. Status post cardiac catheterization April 2006 with normal  coronary      arteries and EF of 20-25%.  2. Nonischemic cardiomyopathy.  3. Chronic systolic congestive heart failure.  4. History of left bundle branch block.  5. Obesity.  6. Hypertension.  7. History of hyperthyroidism treated with Iodine-131 resulting in      iatrogenic hypothyroidism.   SURGICAL HISTORY:  She is status post appendectomy and cardiac  catheterization.   ALLERGIES:  She had a rash with a medication she took fairly recently  whose name is unknown and she is intolerant or allergic to [facility].   CURRENT MEDICATIONS:  1. Aspirin 81 mg a day.  2. Coreg to 3.125 mg b.i.d.  3. Digoxin 0.125 mg daily.  4. Lovenox 40 mg subcu daily.  5. Lasix 40 mg IV daily.  6. Humibid 600 mg p.o. b.i.d.  7. Tussionex 5 mL b.i.d.  8. Atrovent and Xopenex q.6h.  9. Synthroid 25 mcg daily.  10.Zestril 10 mg b.i.d.  11.Solu-Medrol 60 mg IV dosage, this is decreased to daily as of      today.  12.Protonix 40 mg daily.   SOCIAL HISTORY:  She lives alone in [place] and has daughters nearby.  She works part-time in [employer].  She denies alcohol, tobacco or  drug abuse.   FAMILY HISTORY:  Her mother died at age 55 suddenly while on a bus  coming home from work.  No autopsy was done and she had a history of  cirrhosis.  Her father died at age 65 with a history of asthma and he  died suddenly while driving home from work in his car.  She has one  sister that died of lung cancer and one brother that died of an  overdose.   REVIEW OF SYSTEMS:  She had fevers is prior to admission and fatigue  prior to admission which has continued.  The shortness of breath is  described above.  She has had pain in her right shoulder for about eight  months.  She denies hematemesis, hemoptysis or melena.  Review of  systems is, otherwise, negative.   PHYSICAL EXAMINATION:  VITAL SIGNS:  Temperature is 97.4, blood pressure  124/75, pulse 100, respiratory rate 20, O2 saturation 97% on 3  liters.  Inputs and outputs -1573 mL in the last 24 hours, and weight was 60.37  kg on admission and is 63.8 kg today.  GENERAL:  She is a well developed, well nourished [place] American  female who coughs frequently and is short of breath with conversation.  HEENT:  Her head is normocephalic and atraumatic with extraocular  movements intact.  Sclerae clear. Nares without discharge.  NECK:  There is no lymphadenopathy or thyromegaly appreciated.  No  bruits are noted.  JVP is difficult to assess secondary to body habitus.  CV:  Heart is regular in rate and rhythm with an S1 and S2 and a  systolic murmur is noted at the left upper sternal border.  LUNGS:  She has crackles in her bilateral bases up to 1/3.  SKIN:  She has some excoriated areas on her right shoulder and upper arm  with a rash in that area.  ABDOMEN:  Supple with active bowel sounds and no rebound.  EXTREMITIES:  She has 2+ pulses in all four extremities and no edema is  noted.  MUSCULOSKELETAL:  There are no joint deformity effusions.  NEURO:  She is alert and oriented.  Cranial nerves 2-12 grossly intact.   Chest x-ray shows cardiomegaly with decreased CHF from the x-ray done on  admission.  EKG is rate 79 sinus rhythm with an old left bundle branch  block.   LABORATORY TODAY:  BNP 515 today (1390 on admission).  TSH 6.835 with a  free T4 of 0.8.  Sodium 131, potassium 4.2, chloride 99, CO2 27, BUN 26,  creatinine 1.1, glucose 172. Hemoglobin 15.8, hematocrit 47.5, WBCs 5,  platelets 137.  CK-MB negative x3. Troponin I peak 0.42.   Echocardiogram done June 12, 2006, shows an EF of 15% with a PAS of  41 and paradoxical septal motion as well as moderate TR.    1. Acute on chronic systolic CHF.  Her BNP is improved with IV Lasix.      We will give her an extra dose of IV Lasix today and then go back      to daily in a.m.  Will recheck a BMP in a.m.  Coreg is an excellent     medications for her and will increase the  dose as tolerated.      Currently, if  she tolerates an increased beta blocker, this is a      better medication regimen for her than being on both an ACE      inhibitor and ARB, so we will discontinue the Cozaar for now.      There is no survival status according to its use in a patient with      heart failure.  She may, at some point, tolerate Aldactone, but we      will not initiate this at this time.  2. Possible ICD:  Ms. Kaszuba meets the MABIT II criteria for an ICD      and may benefit from a biventricular device.  However, she should      be stabilized and be euvolemic as an outpatient.  She should then      have assessment by EP to confirm compliance with medications and      willingness to follow up as an outpatient.  At that time, the      situation can be discussed with her and she can be evaluated for      possible device placement.  Her cough is probably secondary to      fluid.  It is doubtful that the cough is secondary to an ACE      inhibitor as she has been on it for greater than two years.  We      will check on finances for the clinic as she states the reason she      has not seen Dr. Percival Spanish recently was because of financial      considerations.  The patient is, otherwise, under the excellent      care of the [facility] team and we will follow along with you.      Rosaria Ferries, PA-C      Fay Records, MD, [facility]  Electronically Signed    RB/MEDQ  D:  06/15/2006  T:  06/16/2006  Job:  [id number]   cc:   Monia Sabal. Jobe Igo, M.D.  Minus Breeding, MD, [facility]

## 2010-09-23 NOTE — Assessment & Plan Note (Signed)
[facility]                          CHRONIC HEART FAILURE NOTE   Morgan Clay, Morgan Clay                    MRN:          [id number]  DATE:08/03/2006                            DOB:          01/17/1946    Morgan Clay is new to my heart failure clinic.  She is a very pleasant  64 year old African-American female with longstanding history of poorly-  controlled hypertension, previously diagnosed with non-ischemic  cardiomyopathy, most likely secondary to her hypertension, status post  cardiac catheterization April 2006, showing normal coronaries, EF of 20-  25% at that time with significant pulmonary hypertension.  Morgan Clay  was just recently discharged from [facility], where she was admitted  with acute on chronic congestive heart failure exacerbation.  Repeat  echocardiogram during that hospitalization shows her ejection fraction  as decreased to 15%.   Morgan Clay is a very pleasant lady, who previously worked two part-time  jobs, Science writer.  She lives in [place] alone.  She  has family nearby, including multiple grandchildren and great  grandchildren.  She currently smokes two Black and Tan cigars a week,  does not smoke them all at one time; each cigar lasts her about four  days.  She also drinks one glass of red wine each evening.  Previously  had a history of liquor use, quit many years ago.  She is currently  applying for disability.   She denies any chest discomfort, presyncope or syncopal episodes.  She  states she is feeling much better since she was recently discharged from  the hospital.  She does complain of some dizzy spells that are  orthostatic in nature.  She has pretty much developed a sedentary  lifestyle since discharged home.  She does maintain her own apartment,  does some light housekeeping and light cooking, nothing very extensive.  Overall is not pleased with her quality of life.  Over the last six  months, she has noticed a decrease in her energy level, increased  fatigue, increased shortness of breath with exertion, increased weight-  gain.  Her previous dry weight was 118 pounds.  Over the last couple of  years, she states her weight has steadily increased.  Previous  documentation, here in April of 2006, showed a weight of 148 pounds.  Morgan Clay also has a history of iatrogenic hypothyroidism, secondary  to hyperthyroidism treated with iodine 131 in the past.  She is followed  by [facility] for management of her hypothyroidism.   PAST MEDICAL HISTORY:  1. Includes congestive heart failure, secondary to non-ischemic      cardiomyopathy.  Most recent ejection fraction 15% by      echocardiogram.  2. Status post cardiac catheterization in 2006 with normal coronaries.  3. Moderately severely elevated pulmonary pressures by cardiac      catheterization in 2006.  Patient had a pulmonary artery capillary      wedge pressure mean 33, LV 182/28, AO 183/95, cardiac output 2.4,      cardiac index 1.5 by __________ , RA mean 18, RV 67/12, PA 68/35      with a  mean of 50.  4. Previous hyperthyroidism, treated with iodine 131, resulting in      iatrogenic hypothyroidism.  5. Poorly-controlled hypertension.  6. History of left bundle branch block.  7. Remote history of alcohol use, currently consuming one glass of red      wine daily.  8. Tobacco use in the form of cigars.  9. Status post appendectomy.  10.Questionable COPD, asthma, requiring use of an inhaler p.r.n.   REVIEW OF SYSTEMS:  As stated above.   ALLERGIES:  Include PERCOCET.   CURRENT MEDICATIONS INCLUDE:  1. Aspirin 81 mg daily.  2. Digitek 0.125 mg daily.  3. Furosemide 80 mg daily.  4. Lisinopril 40 mg daily.  5. Cozaar 50 mg daily.  6. Coreg 12.5 mg b.i.d.  7. Levothyroxine 25 micrograms daily.  8. Hydralazine 25 mg t.i.d.  9. Albuterol one puff t.i.d. p.r.n. as needed.   PHYSICAL EXAM:  Blood pressure  initially 162/95, re-evaluated at 160/84  in the left arm, heart rate of 63.  Morgan Clay is in no acute distress.  Weight today 128 is up ten pounds from her discharge weight from [facility] in February, at which time the patient states she weighed 118  pounds.  Note:  Morgan Clay is only 4 feet 11.  No jugular venous  distention at 45 degree angle.  LUNGS:  Her lungs are clear to auscultation bilaterally.  CARDIOVASCULAR EXAM:  Reveals an S1 and S2, regular rate and rhythm.  She has a 2/6 systolic ejection murmur.  ABDOMEN:  Soft, nontender, positive bowel sounds.  LOWER EXTREMITIES:  Without any edema.  NEUROLOGICALLY:  Patient alert and oriented times three.  Cranial nerves  II through XII grossly intact.  Morgan Clay is very focused and  attentive and participates actively in her heart failure education.   IMPRESSION:  1. Stable, Class III [place] Heart Association symptoms at this time      with no signs of volume overload currently.  Ejection fraction 15%.      Will adjust medications for optimization.  2. Hypertension.  3. Tobacco use.  I have initiated education with Morgan Clay and her      granddaughter, who is accompanying her today.  I have given her a      heart failure education packet.  They have my phone number to call      me if they have any problems.  I have spent greater than 30 minutes      with patient, establishing care.  At this time, adjustments in      medications include:   A.  Discontinuing Cozaar.  B.  Discontinuing Digoxin to make room for increased titration of Coreg  to 18.375 mg p.o. b.i.d.  I am going to change her Lasix to 40 mg p.o.  b.i.d.   I will see her back in one week, at which time I will check labs.  [name] understands to call me if she has any concerns or problems over  the weekend.   Note:  The patient's primary cardiologist is Dr. Percival Spanish.      [name], ACNP  Electronically Signed     Minus Breeding, MD,  [facility]  Electronically Signed   MB/MedQ  DD: 08/03/2006  DT: 08/03/2006  Job #: [id number]   cc:   Monia Sabal. Jobe Igo, M.D.

## 2010-09-23 NOTE — Cardiovascular Report (Signed)
NAMEDAYLA, FRAPPIER NO.:  [id number]   MEDICAL RECORD NO.:  [id number]          PATIENT TYPE:  OIB   LOCATION:  6501                         FACILITY:  [place]   PHYSICIAN:  Minus Breeding, M.D.   DATE OF BIRTH:  16-Apr-1946   DATE OF PROCEDURE:  DATE OF DISCHARGE:                              CARDIAC CATHETERIZATION   PRIMARY CARE PHYSICIAN:  Dr. Laney Pastor.   PROCEDURE:  Left and right heart catheterization/coronary arteriography.   INDICATION:  The patient is with cardiomyopathy of unclear etiology.   PROCEDURAL NOTE:  Left heart catheterization was performed via the right  femoral artery, right heart catheterization performed via the right femoral  vein.  The artery was cannulated using a Smart needle, the vein was  cannulated using anterior wall puncture.  A #4 French arterial sheath and a  #7 [place] venous sheath were inserted via modified Seldinger technique.  There is a small hematoma.  The patient tolerated the procedure well and  left the lab in stable condition after using Swann-Ganz catheter and  pulmonary capillary wedge catheter for pressures.  The results of  hemodynamics:  RA mean 18, RV 67/12, PA 68/35 with a mean of 50, pulmonary  artery capillary wedge pressure mean 33, LV 182/28, AO 183/95.  Cardiac  output is 2.4, cardiac index 1.5 (Fick).  Coronary left main was normal.  The LAD was a large vessel wrapping the apex.  It was normal.  There was a  large first diagonal.  There were several other smaller diagonals.  They  were all normal.  The circumflex in the AV groove was a dominant vessel.  It  was normal.  There was a large mid-obtuse marginal which was normal.  There  was a large branching posterolateral which was normal.  The PDA was large  and normal.  The right coronary artery was nondominant and normal.  The left  ventriculogram was obtained in the RAO projection.  The EF was approximately  20-25% with global hypokinesis.  Inclusion of  severe nonobstructive  cardiomyopathy.  Normal coronary arteries.  Moderately severely elevated  pulmonary pressures.   PLAN:  The patient will have aggressive medical management of her  nonischemic cardiomyopathy.  We will bring her back early to the Heart  Failure Clinic next week.  We will also evaluate her groin at that point to  make sure there have been no complications from this somewhat complicated  stick.      JH/MEDQ  D:  09/02/2004  T:  09/02/2004  Job:  [id number]   cc:   Dr. Laney Pastor

## 2010-09-23 NOTE — Discharge Summary (Signed)
NAMELEILIANA, GONSER NO.:  [id number]   MEDICAL RECORD NO.:  [id number]          PATIENT TYPE:  EMS   LOCATION:  ED                           FACILITY:  [facility]   PHYSICIAN:  Ron Parker, M.D.DATE OF BIRTH:  11-Feb-1946   DATE OF ADMISSION:  06/15/2004  DATE OF DISCHARGE:  06/15/2004                                 DISCHARGE SUMMARY   HISTORY:  This is a pleasant, 64 year old African American female who is  newly moved to the area.  Has no local physicians. She presents with a  history of CHF and hypertension, hypothyroidism with a three-day history  of intermittent back pain and intermittent chest tightness without any other  associated symptoms; no nausea, vomiting, jaw, neck pain, diaphoresis,  lightheadedness.  She denies shortness of breath or dyspnea on exertion.  She states recently, last three days, she has been moving into her new  house, lifting heavy furniture and boxes.  Chest tightness is intermittent,  lasting five to 10 minutes and is not worsened with activity.  In the ED,  she is chest pain free and this was spontaneous as nitroglycerin did not  resolve any symptoms.  At the time of history and exam, the patient was  chest pain free.  She continues to have back pain between her shoulders.   PAST MEDICAL HISTORY:  1.  Questionable congestive heart failure.  No known catheterization.  Echo      was done but she does not remember what her EF was as it was      approximately eight to 10 years ago.  2.  Hypertension.  3.  Hypothyroidism.  4.  Normal lipids as she had them checked in November by her primary care      physician in [place], [place]:  1.  Prinivil 40 mg p.o. daily.  2.  Lasix 40 mg daily.  3.  Levothyroid 75 mcg p.o. daily.   ALLERGIES:  PERCOCET causes her to have a rash.   PAST SURGICAL HISTORY:  None.   FAMILY HISTORY:  Positive for hypertension and coronary artery disease in  her mother who passed away at  the age of 69 with a heart attack.  No known  diabetes or dyslipidemia that runs in the family.  No known cancers.   SOCIAL HISTORY:  She has recently moved with two daughters here from  [place], [place].  She denies tobacco or alcohol or illicit drug use.   REVIEW OF SYSTEMS:  Negative for headaches, blurred vision, neck or jaw  pain, problems swallowing.  No weight changes, no night sweats, no  orthopnea, no PND.  Denies abdominal pain, hematochezia, melanotic stools,  hematemesis, hemoptysis.  No fevers, chills, cough, congestion.  No urinary  dysuria.  No problems with musculoskeletal pain except for that as noted in  the last three days with her moving into town.   REVIEW OF SYSTEMS:  Per HPI.   PHYSICAL EXAMINATION:  VITAL SIGNS:  Temperature 97.1, blood pressure  168/91, pulse 85, respirations 18.  She is 98% on room air.  GENERAL:  This is a slightly obese  African American female in no acute  distress, sitting upright in bed.  HEENT: Pupils are equal, round and reactive to light.  Extraocular movements  are intact.  Oropharynx clear without exudate or drainage.  NECK:  Supple without masses.  No JVP.  No lymphadenopathy.  CARDIOVASCULAR:  Regular rate and rhythm.  No murmurs, rubs or gallops.  RESPIRATORY:  Lungs are clear to auscultation bilaterally with good air  movement.  ABDOMEN:  Obese, nontender, nondistended, positive bowel sounds.  No flank  tenderness.  EXTREMITIES:  No clubbing, cyanosis or edema. Pulses intact bilaterally,  upper and lower extremities.  NEUROLOGIC: She is alert and oriented x3.  Cranial nerves II-XII intact. No  focal sensation or strength deficits.  Range of movement is full in all  extremities.   LABORATORY DATA:  White count 5.4, hemoglobin 14.5, hematocrit 33, MCV 90,  RDW 15.2, platelets 179.  Her point-of-care markers x2 are negative.  Sodium  138, potassium 3.9, chloride 106, bicarb 29, glucose 89, BUN 13, creatinine  0.8, calcium 9.1,  total protein 7.7, albumin 3.6, AST 53, ALT 55, total  bilirubin 0.9.  EKG showed significant for left bundle branch block, normal  sinus rhythm and left axis deviation.  Repeat EKG without any changes.  No  ST segment elevations or T wave inversions indicative of acute ischemia.  Chest x-ray showed cardiomegaly but no significant effusions.  No  infiltrates.   ASSESSMENT:  1.  Atypical chest pain.  2.  Hypertension.  3.  History of left bundle branch block.  4.  Hypothyroidism.   PLAN:  After complete history and atypical chest pain presentation, most  likely explanation is due to musculoskeletal etiology.  Point-of-care  markers x3 negative which is very helpful since she does have left bundle  branch block.  Note, I did look for concordant ST elevations and discordant  ST elevations.  However, those were not present on EKG.  At this time we  will discharge her home in stable condition without chest pain.  Oxygen  saturations are normal on room air and she is the continue taking her  medications and establish herself with a primary care physician as she is  new in town.  She has been instructed to take over-the-counter medications  for sore back and sore chest.      JD/MEDQ  D:  06/15/2004  T:  06/16/2004  Job:  [id number]

## 2010-09-23 NOTE — Discharge Summary (Signed)
Morgan Clay, Morgan Clay NO.:  [id number]   MEDICAL RECORD NO.:  [id number]          PATIENT TYPE:  INP   LOCATION:  [id number]                         FACILITY:  [facility]   PHYSICIAN:  Edythe Lynn, M.D.       DATE OF BIRTH:  Dec 10, 1945   DATE OF ADMISSION:  06/10/2006  DATE OF DISCHARGE:  06/19/2006                               DISCHARGE SUMMARY   PRIMARY CARE PHYSICIAN:  [facility].   DISCHARGE DIAGNOSES:  1. Bronchopneumonia, resolved.  2. Acute systolic congestive heart failure exacerbation.  3. Nonischemic cardiomyopathy with ejection fraction of 15%.  4. Hypertension.  5. Hypothyroidism.  6. Left bundle branch block   DISCHARGE MEDICATIONS:  1. Levothyroxine 25 mcg daily.  2. Digoxin 0.125 mg daily.  3. Furosemide 80 mg daily.  4. Lisinopril 40 mg daily.  5. Cozaar 50 mg daily.  6. Carvedilol 12.5 mg twice a day.  7. Hydralazine 25 mg t.i.d.   CONDITION ON DISCHARGE:  Morgan Clay was discharged in fair condition.  At the time of discharge, she was instructed to follow up with Dr. Lysle Pearl at [facility] with the date and time of the  appointment being June 25, 2006 at 9 a.m.  The patient was also set  up with. Home health to continue education about her heart failure and  to continue to assure that the patient is compliant with her  medications.  The main issues for the followup of this patient is if [name] will need a repeat echocardiogram and if she continues to have a  very low ejection fraction while she is taking all her medications  including the ACE inhibitor, the ARB, the beta blocker and hydralazine.  Then she will prior benefit for re-referral to the [facility] Cardiology  group for consideration of implantable cardiac defibrillator and  biventricular pacer.  For admission history and physical, refer to  dictation done by Dr. Marye Round, June 10, 2006.   PROCEDURES:  1. On June 11, 2006, the patient  underwent chest x-ray PA and      lateral with findings of cardiomegaly and pulmonary vascular      congestion.  2. June 16, 2006, findings of chest x-ray with findings of cardiac      enlargement, pulmonary vascular congestion.   CONSULTATIONS:  The patient was seen in consultation by the [facility]  Cardiology group.   HOSPITAL COURSE:  1. Fever and coughing,  Morgan Clay was admitted with a presumed      diagnosis of bronchopneumonia/interstitial pneumonia.  The patient      was started on Avelox as well as nebulizer treatments.  Her      respiratory status improved slightly throughout the hospitalization      with the patient had a slight worsening in her mental status      including a delirious episode.  The patient's pneumonia continued      to improved and by June 19, 2006, the patient's respiratory      status was considered stable for discharge.  By June 19, 2006,      the  patient has completed 10 days of antibiotic which is a very      good course.  She does not require any outpatient antibiotic      treatment at this point in time.  2. Systolic congestive heart failure exacerbation.  We have titrated      the patient's medications throughout her hospitalization.  She was      seen in consultation by the [facility] Cardiology group and, at this      point in time, it was felt that the patient would need full      titration in her medications with maximizing the angiotensin      receptor blocker, the ACE inhibitor, the hydralazine, and the beta      blocker before further consideration is given to an implantable      cardiac defibrillator and/or a biventricular pacer.      Edythe Lynn, M.D.  Electronically Signed     SL/MEDQ  D:  06/19/2006  T:  06/19/2006  Job:  [id number]   cc:   Monia Sabal. Jobe Igo, M.D.  Fax: [phone]

## 2010-09-23 NOTE — H&P (Signed)
[name], FOCO NO.:  [id number]   MEDICAL RECORD NO.:  [id number]          PATIENT TYPE:  INP   LOCATION:  0102                         FACILITY:  [facility]   PHYSICIAN:  Edythe Lynn, M.D.       DATE OF BIRTH:  1945/10/29   DATE OF ADMISSION:  06/10/2006  DATE OF DISCHARGE:                              HISTORY & PHYSICAL   PRIMARY CARE PHYSICIAN:  Dr. Lysle Pearl with [facility] which makes the patient unassigned for [facility].   CHIEF COMPLAINTS:  Cough.   HISTORY OF PRESENT ILLNESS:  [name] is a 65 year old woman who  reports a 1-week course of progressive shortness of breath, fever,  chills and worsening cough.  She reports that today her cough got so bad  that she was unable to relax anymore.  She reports that the cough is not  productive of any sputum.  The patient has a past medical history of  nonischemic cardiomyopathy, but she reports being compliant with her  medications.   PAST MEDICAL HISTORY:  1. Nonischemic cardiomyopathy with ejection fraction of 15%.  2. Hypertension.  3. Hypothyroidism secondary to thyroid ablation.  4. Left bundle branch block.   HOME MEDICATIONS:  1. Lasix 20 mg daily.  2. Lisinopril 10 mg daily.  3. Digitek 0.125 mg daily.  4. Levothyroxine 125 mcg daily.  5. Flexeril 10 mg as needed.   SOCIAL HISTORY:  The patient is single, lives alone.  She works in the  school system.  She denies any tobacco abuse.  She occasionally drinks  alcohol like once or twice a week. She has two children.   FAMILY HISTORY:  The patient's mother died at the age of 81 of a heart  attack. The patient's father died at age 67 of a heart attack.  The  patient has two siblings that died, the brother from drug abuse and the  sister from an unknown type of cancer.   REVIEW OF SYSTEMS:  As per HPI.  Also the patient reports feeling of  muscle soreness all over, feeling of headache, nausea but no  vomiting.  No diarrhea.  No abdominal pain. Other systems as per HPI.  All other  systems are negative.   PHYSICAL EXAMINATION ON ADMISSION:  VITAL SIGNS:  Temperature 103.2,  blood pressure 184/110, pulse 108, respirations 24, saturation 97% on  room air.  GENERAL APPEARANCE:  Well-developed, well-nourished African American  woman in no acute distress lying on the stretcher able to speak in full  and complete sentences.  HEENT:  Her head is normocephalic, atraumatic.  Eyes, pupils equal,  round, reactive to light accommodation.  Extraocular movements intact.  Conjunctiva pink.  Sclerae anicteric.  Throat clear. Buccal mucosa is  moist.  NECK:  Supple.  No JVD.  No carotid bruits.  CHEST:  Bilateral rhonchi, right greater than left.  No crackles and no  wheezes.  HEART:  Tachycardia regular with S3 gallop.  Also 3/6 systolic murmur  best heard at right upper sternal border.  ABDOMEN:  Soft, nontender, nondistended, bowel sounds are present.  LOWER EXTREMITIES:  Have no edema.  Skin is warm and dry without any  suspicious rashes.  NEUROLOGIC:  Cranial nerves III-XII are intact. Sensation intact  in all  4 extremities.  Gait is intact.   LABORATORY VALUES:  At the time of admission, white blood cell count  6.5, hemoglobin 15.2, platelet count 177, sodium 135, potassium 3.7,  chloride 104, bicarb 24, glucose 96, BUN 90, creatinine 0.9.  BNP 1390.  Urinalysis within normal limits.   Chest x-ray shows bibasilar interstitial infiltrates, right greater than  left.   ASSESSMENT/PLAN:  1. Bronchopneumonia:  This is probably related to a viral infection      versus the possibility of a bacterial infection following a viral      infection.  The patient will be admitted to the acute care unit of      [facility] where she will be placed on intravenous      antibiotics using Avelox.  Oxygen and nebulizers will be used as      needed.  I wish I could culture a sputum, but the  patient is not      making any sputum.  Will obtain two blood cultures.  2. Acute systolic heart failure exacerbation secondary to infectious      process.  Will increase the patient's dose of furosemide and follow      up her BNP and chest x-ray.      Edythe Lynn, M.D.  Electronically Signed     SL/MEDQ  D:  06/10/2006  T:  06/11/2006  Job:  [id number]   cc:   Monia Sabal. Jobe Igo, M.D.  Fax: [phone]

## 2010-09-23 NOTE — Assessment & Plan Note (Signed)
[facility]                          CHRONIC HEART FAILURE NOTE   JANAYSHA, GARRETT                    MRN:          [id number]  DATE:08/31/2006                            DOB:          22-Dec-1945    Ms. Lizalde returns today for followup of her congestive heart failure,  which is secondary to non-ischemic cardiomyopathy, most likely due to  hypertension.  She is status post cardiac catheterization in 2006 that  showed normal coronaries with an EF of 20-25%, with significant  pulmonary hypertension, a repeat echocardiogram done in hospital setting  during recent admission for acute heart failure.  Patient was found to  have a EF of 15%.  Ms. Deakin was started on the appropriate  medications and has established care here in the heart failure clinic.  She returns today, states she is feeling good, initially was having  trouble with the Coreg titration but has tolerated this well over the  last two months.  She still has ongoing fatigue; however, not as  significant as it has been in the past.  Previously, I walked with her  for evaluation.  She was able to walk about 60 feet, before having to  stop secondary to increased weakness.  Today, she is able to walk around  150 feet, before having to stop.  She complains of some mild abdominal  bloating last week that has resolved.  Denies any orthopnea, PND.  No  pre-syncope or syncopal episodes.  She previously has smoked two cigars  a week, states she is trying to give up that habit.   PAST MEDICAL HISTORY:  1. Includes congestive heart failure secondary to non-ischemic      cardiomyopathy, in the setting of poorly-controlled hypertension,      also with remote history of liquor abuse.  Most recent EF 15% by      echocardiogram.  2. Status post cardiac catheterization, normal coronaries.  3. Moderate to severely elevated pulmonary pressures by cardiac      catheterization.  4. Previous  hyperthyroidism treated with iodine 131, resulting in      iatrogenic hypothyroidism, currently managed by Dr. Jobe Igo.  5. History of poorly-controlled hypertension.  6. Left bundle branch block.  7. History of alcohol abuse in the form of liquor, currently now      consuming one glass of red wine daily.  8. Tobacco use in the form of cigars.  Patient is trying to abstain.  9. Questionable COPD or asthma requiring use of inhaler p.r.n.   REVIEW OF SYSTEMS:  As stated above.   CURRENT MEDICATIONS:  Include:  1. Aspirin 81.  2. Lisinopril 40.  3. Hydralazine 25 t.i.d.  4. Albuterol 1 puff t.i.d. p.r.n.  5. Coreg 18.75 b.i.d.  6. Lasix 40 b.i.d.  7. Kay Ciel 20 b.i.d.  8. Levothroid 75 mcg daily.   PHYSICAL EXAMINATION:  VITAL SIGNS:  Weight 128, blood pressure 139/82  with a heart rate of 61.  GENERAL:  Ms. Whitler is in no acute distress.  No jugular venous  distention at 45-degree angle.  LUNGS:  Clear to auscultation.  She  has some fine rhonchi in the left  upper lobe.  CARDIOVASCULAR:  Reveals S1, S2, regular rate and rhythm.  ABDOMEN:  Soft, nontender, positive bowel sounds.  No distention noted  at this time.  EXTREMITIES:  Lower extremities without clubbing, cyanosis or edema.   IMPRESSION:  Ms. Gorsky is still class III [place] Heart Association  heart failure at this time.  I am going to go ahead and titrate her  Carvedilol up to 25 mg b.i.d.  Patient's heart rate has tolerated the  increased titration.  She knows to call me if she has any problems with  her medications.  Patient's primary cardiologist, Dr. Haroldine Laws.      [name], ACNP  Electronically Signed      Minus Breeding, MD, [facility]  Electronically Signed   MB/MedQ  DD: 08/31/2006  DT: 08/31/2006  Job #: [id number]   cc:   Monia Sabal. Jobe Igo, M.D.

## 2010-09-23 NOTE — H&P (Signed)
[name], [name] NO.:  [id number]   MEDICAL RECORD NO.:  [id number]          PATIENT TYPE:  INP   LOCATION:                               FACILITY:  [facility]   PHYSICIAN:  Glori Bickers, M.D. LHCDATE OF BIRTH:  [birth year]   DATE OF ADMISSION:  08/04/2004  DATE OF DISCHARGE:                                HISTORY & PHYSICAL   REFERRING PHYSICIAN:  Linton Ham. Laney Pastor, M.D.   PATIENT IDENTIFICATION:  Morgan Clay is a 65 year old woman with a history  of hypertension and nonischemic cardiomyopathy diagnosed in 1998 and  previously followed in [place] who is being admitted from heart  failure clinic for CHF exacerbation.   HISTORY OF PRESENT ILLNESS:  Morgan Clay says that she was first diagnosed  with heart failure in 37 in [place], [place].  By her history it sounds  like she had a cardiac catheterization at that time, but she is a bit  unclear about this.  She said she was followed closely there and after her  initial diagnosis, had relatively few problems with heart failure.  She was  able to walk up and down steps and do just about anything she wanted to do  without any limitations.  Several months ago she moved down from [place],  [place]. to here in [place].  This past Monday she noticed that she began  to become progressively shortness of breath and orthopneic.  She also  noticed a pain in her right shoulder that felt like severe arthritis and  radiated up into her back and jaw.  She said it was worse when cold air  touched it.  It did not get worse with exertion.  Over this week, she became  progressively dyspneic on exertion to the point where she would get markedly  short of breath after just walking up one flight of steps.  She did not have  any peripheral edema with this.  Today, she went to urgent care and saw Dr.  Laney Pastor and he contacted our office to have her evaluated for CHF flare.   She presented to the office and she was  markedly dyspneic, just on  ambulating around the clinic room.  She was complaining of chronic right  shoulder pain which was reproducible on palpation.  Chest x-ray showed  significant pulmonary edema and she is being admitted for diuresis and rule  out MI.   CURRENT MEDICATIONS:  1.  Lasix 20 mg a day.  2.  Prinvil.  3.  Thyroid medication.   PAST MEDICAL HISTORY:  1.  Heart failure, presumed nonischemic, diagnosed in 1998, and previously      followed in [place], [place].  2.  Hypertension.  3.  Hyperthyroidism, status post iodine ablation.  4.  Obesity.  5.  Left bundle branch block of unknown duration.   SOCIAL HISTORY:  She works as a Secondary school teacher.  She has two children.  She is single.  She did not give any other details regarding her marital  status.  She lives here alone in [place], [place].  She denies any  tobacco or alcohol  use.   FAMILY HISTORY:  Her mother died at age 79 from a heart attack.  Her father  died at age 43 of a heart attack.  She has a sister and a brother, both who  have died.  Sister died from cancer.  Brother died from drug abuse.   PHYSICAL EXAMINATION:  GENERAL APPEARANCE:  She is markedly dyspneic on just  walking around the office.  VITAL SIGNS:  Blood pressure 170/110, pulse 86, weight 150 pounds.  HEENT:  Sclerae are anicteric.  EOMI.  NECK:  Supple.  Neck veins are elevated above the level of her jaw.  Carotids are 2+ bilaterally without any bruits.  There is no goiter.  There  is no lymphadenopathy.  LUNGS:  Decreased breath sounds at the bases, otherwise relatively clear.  CARDIOVASCULAR:  There is a regular rate and rhythm with summation gallop  and no obvious murmur.  ABDOMEN:  Obese, mildly distended.  There are no bruits.  Her liver edge  appears several fingerbreadths down.  EXTREMITIES:  Warm.  There is trace edema but no cyanosis or clubbing.  NEUROLOGIC:  She is alert and oriented x3, otherwise nonfocal.   EKG  shows normal sinus rhythm and a rate of 84 with a left bundle branch  block.  It is not clear if this is new or old.  There is also left atrial  enlargement.   A chest x-ray shows diffuse pulmonary edema.   ASSESSMENT/PLAN:  Morgan Clay is a 65 year old woman as above with an eight-  year history of heart failure secondary to presumed nonischemic  cardiomyopathy.  According to her, she has been well compensated over the  past eight years, however, now she presents with decompensated  heart  failure in the setting of hypertensive crisis.  As there are no beds  available in the hospital, we will send her to the ER.  She will need IV  diuresis as well as IV nitroglycerin to get her blood pressure down.  We  will also check serial cardiac markers to evaluate for any component of  ischemia.  If she rules out, will have to decide between performing an  adenosine Cardiolite or  preceding directly to catheterization.  I will need  to get her old records to further assess the duration of her left bundle  branch block.  Given the duration of her cardiomyopathy, she would also  likely benefit from an EP evaluation for possible ICD placement plus/minus  BiV pacer.      DB/MEDQ  D:  08/04/2004  T:  08/04/2004  Job:  [id number]   cc:   Linton Ham. Laney Pastor, M.D.  [address]  [place]  [place] [address]  Fax: [phone]

## 2010-09-28 ENCOUNTER — Ambulatory Visit (INDEPENDENT_AMBULATORY_CARE_PROVIDER_SITE_OTHER): Payer: Medicare Other | Admitting: Pulmonary Disease

## 2010-09-28 ENCOUNTER — Telehealth: Payer: Self-pay | Admitting: Pulmonary Disease

## 2010-09-28 DIAGNOSIS — G4733 Obstructive sleep apnea (adult) (pediatric): Secondary | ICD-10-CM

## 2010-09-28 NOTE — Assessment & Plan Note (Signed)
The pt has very mild osa by her recent sleep study, with AHI of only 7/hr.  I suspect this is not a significant CV risk for her, and she may want to continue working on weight loss and treat conservatively.

## 2010-09-28 NOTE — Telephone Encounter (Signed)
Recent home study showed AHI 7/hr Pt is asymptomatic, and represents very little CV risk for her.  Can treat with usual treatments for mild osa, or she can continue to work on weight loss.

## 2010-09-28 NOTE — Progress Notes (Signed)
The pt underwent home sleep testing with a type 3 monitoring device.  Airflow, effort, oximetry, and pulse rate were all monitored during the study.  The raw data and tracings have been reviewed with the following findings:  1) flow evaluation period of 7hrs and 15min 2) the pt was found to have 10 apneas and 40 hypopneas, giving her an AHI 7/hr 3) desaturation was noted to 81%, and the pt only spent 9 min less than or equal to 88%.

## 2010-09-30 NOTE — Telephone Encounter (Signed)
[id number] 

## 2010-10-04 NOTE — Telephone Encounter (Signed)
Called and spoke with pt.  Pt aware of apnea link results and KC's recs.  Pt verbalized understanding and denied any questions.

## 2010-10-05 ENCOUNTER — Encounter: Payer: Self-pay | Admitting: Pulmonary Disease

## 2010-11-22 ENCOUNTER — Encounter: Payer: Self-pay | Admitting: Internal Medicine

## 2010-11-22 ENCOUNTER — Ambulatory Visit (INDEPENDENT_AMBULATORY_CARE_PROVIDER_SITE_OTHER): Payer: Medicare Other | Admitting: Internal Medicine

## 2010-11-22 DIAGNOSIS — I509 Heart failure, unspecified: Secondary | ICD-10-CM

## 2010-11-22 DIAGNOSIS — I428 Other cardiomyopathies: Secondary | ICD-10-CM

## 2010-11-22 DIAGNOSIS — Z95810 Presence of automatic (implantable) cardiac defibrillator: Secondary | ICD-10-CM

## 2010-11-22 DIAGNOSIS — I420 Dilated cardiomyopathy: Secondary | ICD-10-CM

## 2010-11-22 LAB — ICD DEVICE OBSERVATION
AL AMPLITUDE: 4.5 mv
AL IMPEDENCE ICD: 475 Ohm
ATRIAL PACING ICD: 1 pct
CHARGE TIME: 8.5 s
LV LEAD THRESHOLD: 1 V
RV LEAD AMPLITUDE: 20 mv
RV LEAD THRESHOLD: 0.5 V
TZAT-0001SLOWVT: 1
TZAT-0001SLOWVT: 2
TZAT-0002ATACH: NEGATIVE
TZAT-0002ATACH: NEGATIVE
TZAT-0002ATACH: NEGATIVE
TZAT-0002FASTVT: NEGATIVE
TZAT-0011SLOWVT: 10 ms
TZAT-0012FASTVT: 200 ms
TZAT-0018ATACH: NEGATIVE
TZAT-0018SLOWVT: NEGATIVE
TZAT-0018SLOWVT: NEGATIVE
TZAT-0019ATACH: 6 V
TZAT-0019ATACH: 6 V
TZAT-0019FASTVT: 8 V
TZAT-0019SLOWVT: 8 V
TZAT-0019SLOWVT: 8 V
TZAT-0020ATACH: 1.5 ms
TZAT-0020ATACH: 1.5 ms
TZAT-0020FASTVT: 1.5 ms
TZAT-0020SLOWVT: 1.5 ms
TZAT-0020SLOWVT: 1.5 ms
TZON-0003VSLOWVT: 400 ms
TZON-0005SLOWVT: 12
TZST-0001ATACH: 5
TZST-0001FASTVT: 3
TZST-0001FASTVT: 5
TZST-0001SLOWVT: 3
TZST-0001SLOWVT: 4
TZST-0001SLOWVT: 5
TZST-0002ATACH: NEGATIVE
TZST-0002ATACH: NEGATIVE
TZST-0002FASTVT: NEGATIVE
TZST-0002FASTVT: NEGATIVE
TZST-0002FASTVT: NEGATIVE
TZST-0003SLOWVT: 20 J
TZST-0003SLOWVT: 35 J

## 2010-11-22 NOTE — Patient Instructions (Signed)
Your physician recommends that you schedule a follow-up appointment in: 3 months with Paula/ Erasmo Downer.  Your physician wants you to follow-up in: 1 year. You will receive a reminder letter in the mail two months in advance. If you don't receive a letter, please call our office to schedule the follow-up appointment.  Your physician recommends that you continue on your current medications as directed. Please refer to the Current Medication list given to you today.

## 2010-11-22 NOTE — Assessment & Plan Note (Signed)
Stable on current meds  Will check electrolytes adn mag level at next blood draw

## 2010-11-22 NOTE — Progress Notes (Signed)
HPI  Morgan Clay is a 65 y.o. female seen in followup for congestive heart failure as stated nonischemic cardio myopathy for which she underwent CRT implantation August 2008 And generator replacement in 2012  She had had intercurrent normalization of ventricular systolic function. However, following hospitalization in March 2012 for chest pain and shortness of breath repeat ultrasonography demonstrated an ejection fraction of 25-30%. She ruled out for myocardial infarction was treated with diuresis.   Seh is doing quite well with complaints of leg heaviness,  Her thyroid status was just evaluated and adjustments made in her meds    Past Medical History  Diagnosis Date  . LBBB (left bundle branch block)   . COPD (chronic obstructive pulmonary disease)   . Essential hypertension, benign   . Automatic implantable cardiac defibrillator in situ 0000000    Concerto ICD 0000000  . Anxiety   . Acute sinusitis   . Hepatitis C   . CHF (congestive heart failure)   . Hypothyroidism   . Hyperthyroidism   . Personal history of other infectious and parasitic disease     Hepatitis B    Past Surgical History  Procedure Date  . Appendectomy   . Cardiac catheterization   . [id number]     Concerto 0000000  . Insert / replace / remove pacemaker     concerto    Current Outpatient Prescriptions  Medication Sig Dispense Refill  . Albuterol Sulfate (VENTOLIN HFA IN) Inhale into the lungs as directed.        [name] aspirin 81 MG tablet Take 81 mg by mouth daily.        [name] atorvastatin (LIPITOR) 40 MG tablet Take 40 mg by mouth daily.        . carvedilol (COREG) 25 MG tablet Take 25 mg by mouth 2 (two) times daily with a meal.        . fluticasone (FLONASE) 50 MCG/ACT nasal spray 2 sprays by Nasal route 2 (two) times daily as needed.       . isosorbide mononitrate (IMDUR) 30 MG 24 hr tablet Take 30 mg by mouth daily.        [name] levothyroxine (SYNTHROID, LEVOTHROID) 125 MCG tablet Take 125 mcg by  mouth daily before breakfast.        . lisinopril (PRINIVIL,ZESTRIL) 20 MG tablet Take 20 mg by mouth 2 (two) times daily.        [name] loratadine (CLARITIN) 10 MG tablet Take 10 mg by mouth daily as needed.       . nitroGLYCERIN (NITROSTAT) 0.4 MG SL tablet Place 0.4 mg under the tongue every 5 (five) minutes as needed.        . sertraline (ZOLOFT) 100 MG tablet TAKE ONE TABLET BY MOUTH EVERY DAY AT BEDTIME  30 tablet  11  . spironolactone (ALDACTONE) 25 MG tablet Take 1 tablet (25 mg total) by mouth daily.  30 tablet  11  . traZODone (DESYREL) 50 MG tablet Take 50 mg by mouth at bedtime as needed.       . furosemide (LASIX) 40 MG tablet Take 40 mg by mouth daily. 2 po daily      . simethicone (MYLICON) 80 MG chewable tablet Chew 80 mg by mouth 3 (three) times daily as needed.         Allergies  Allergen Reactions  . Oxycodone-Acetaminophen     Percocet    Review of Systems negative except from HPI and PMH  Physical Exam Well developed and  well nourished in no acute distress HENT normal E scleral and icterus clear Neck Supple Device pocket well healed Clear to ausculation Regular rate and rhythm, no murmurs gallops or rub Soft with active bowel sounds No clubbing cyanosis and edema Alert and oriented, grossly normal motor and sensory function Skin Warm and Dry   Assessment and  Plan

## 2010-11-22 NOTE — Assessment & Plan Note (Signed)
Stable on current meds 

## 2010-11-22 NOTE — Assessment & Plan Note (Signed)
The patient's device was interrogated.  The information was reviewed. No changes were made in the programming.    

## 2011-02-17 LAB — BASIC METABOLIC PANEL
BUN: 23
CO2: 24
Calcium: 9.8
Calcium: 9.3
Chloride: 100
Creatinine, Ser: 1.1
GFR calc Af Amer: >60
GFR calc Af Amer: >60
GFR calc non Af Amer: 51 — ABNORMAL LOW
GFR calc non Af Amer: 57 — ABNORMAL LOW
GFR calc non Af Amer: 58 — ABNORMAL LOW
Glucose, Bld: 115 — ABNORMAL HIGH
Glucose, Bld: 138 — ABNORMAL HIGH
Potassium: 3.6
Sodium: 136

## 2011-02-17 LAB — DIFFERENTIAL
Basophils Absolute: 0
Basophils Relative: 0
Eosinophils Absolute: 0.3
Eosinophils Relative: 4

## 2011-02-17 LAB — CBC
HCT: 36.1
HCT: 40
MCHC: 33.3
MCV: 89.2
MCV: 89.7
Platelets: 148 — ABNORMAL LOW
Platelets: 335
RDW: 13.9
WBC: 5.5

## 2011-02-17 LAB — PROTIME-INR: Prothrombin Time: 13.1

## 2011-02-20 ENCOUNTER — Encounter: Payer: Medicare Other | Admitting: *Deleted

## 2011-02-22 ENCOUNTER — Other Ambulatory Visit: Payer: Self-pay | Admitting: Internal Medicine

## 2011-02-22 ENCOUNTER — Ambulatory Visit (INDEPENDENT_AMBULATORY_CARE_PROVIDER_SITE_OTHER): Payer: Medicare Other | Admitting: *Deleted

## 2011-02-22 DIAGNOSIS — I509 Heart failure, unspecified: Secondary | ICD-10-CM

## 2011-02-22 DIAGNOSIS — I428 Other cardiomyopathies: Secondary | ICD-10-CM

## 2011-02-22 DIAGNOSIS — Z95810 Presence of automatic (implantable) cardiac defibrillator: Secondary | ICD-10-CM

## 2011-02-22 LAB — ICD DEVICE OBSERVATION
AL IMPEDENCE ICD: 513 Ohm
ATRIAL PACING ICD: 0.18 pct
BAMS-0001: 170 {beats}/min
LV LEAD IMPEDENCE ICD: 513 Ohm
PACEART VT: 0
RV LEAD THRESHOLD: 0.75 V
TOT-0002: 0
TOT-0006: 20120412000000
TZAT-0001ATACH: 1
TZAT-0001ATACH: 2
TZAT-0001FASTVT: 1
TZAT-0001SLOWVT: 1
TZAT-0002ATACH: NEGATIVE
TZAT-0002ATACH: NEGATIVE
TZAT-0002FASTVT: NEGATIVE
TZAT-0004SLOWVT: 8
TZAT-0004SLOWVT: 8
TZAT-0005SLOWVT: 88 pct
TZAT-0005SLOWVT: 91 pct
TZAT-0011SLOWVT: 10 ms
TZAT-0011SLOWVT: 10 ms
TZAT-0012ATACH: 150 ms
TZAT-0012ATACH: 150 ms
TZAT-0012SLOWVT: 200 ms
TZAT-0012SLOWVT: 200 ms
TZAT-0013SLOWVT: 1
TZAT-0013SLOWVT: 2
TZAT-0018ATACH: NEGATIVE
TZAT-0018ATACH: NEGATIVE
TZAT-0018SLOWVT: NEGATIVE
TZAT-0018SLOWVT: NEGATIVE
TZAT-0019ATACH: 6 V
TZAT-0019ATACH: 6 V
TZAT-0019FASTVT: 8 V
TZON-0003ATACH: 350 ms
TZON-0003SLOWVT: 330 ms
TZON-0004SLOWVT: 32
TZON-0004VSLOWVT: 36
TZON-0005SLOWVT: 12
TZST-0001ATACH: 4
TZST-0001ATACH: 5
TZST-0001FASTVT: 2
TZST-0001FASTVT: 3
TZST-0001FASTVT: 6
TZST-0001SLOWVT: 3
TZST-0001SLOWVT: 4
TZST-0001SLOWVT: 6
TZST-0002ATACH: NEGATIVE
TZST-0002FASTVT: NEGATIVE
TZST-0002FASTVT: NEGATIVE
TZST-0002FASTVT: NEGATIVE
TZST-0003SLOWVT: 35 J
VENTRICULAR PACING ICD: 99.81 pct

## 2011-02-22 NOTE — Progress Notes (Signed)
icd check with icm 

## 2011-03-29 ENCOUNTER — Other Ambulatory Visit: Payer: Self-pay | Admitting: Internal Medicine

## 2011-03-29 DIAGNOSIS — N183 Chronic kidney disease, stage 3 unspecified: Secondary | ICD-10-CM

## 2011-04-06 ENCOUNTER — Ambulatory Visit
Admission: RE | Admit: 2011-04-06 | Discharge: 2011-04-06 | Disposition: A | Discharge status: Home / Self Care | Payer: Medicare Other | Source: Ambulatory Visit | Attending: Internal Medicine | Admitting: Internal Medicine

## 2011-04-06 DIAGNOSIS — N183 Chronic kidney disease, stage 3 unspecified: Secondary | ICD-10-CM

## 2011-04-17 ENCOUNTER — Ambulatory Visit: Payer: Medicare Other | Attending: Internal Medicine | Admitting: Rehabilitative and Restorative Service Providers"

## 2011-04-17 DIAGNOSIS — R269 Unspecified abnormalities of gait and mobility: Secondary | ICD-10-CM | POA: Insufficient documentation

## 2011-04-17 DIAGNOSIS — R42 Dizziness and giddiness: Secondary | ICD-10-CM | POA: Insufficient documentation

## 2011-04-17 DIAGNOSIS — IMO0001 Reserved for inherently not codable concepts without codable children: Secondary | ICD-10-CM | POA: Insufficient documentation

## 2011-05-05 ENCOUNTER — Encounter: Payer: Self-pay | Admitting: Internal Medicine

## 2011-05-08 ENCOUNTER — Ambulatory Visit: Payer: Medicare Other | Admitting: Rehabilitative and Restorative Service Providers"

## 2011-05-10 ENCOUNTER — Ambulatory Visit: Payer: Medicare Other | Attending: Internal Medicine | Admitting: Rehabilitative and Restorative Service Providers"

## 2011-05-10 DIAGNOSIS — IMO0001 Reserved for inherently not codable concepts without codable children: Secondary | ICD-10-CM | POA: Insufficient documentation

## 2011-05-10 DIAGNOSIS — R42 Dizziness and giddiness: Secondary | ICD-10-CM | POA: Insufficient documentation

## 2011-05-10 DIAGNOSIS — R269 Unspecified abnormalities of gait and mobility: Secondary | ICD-10-CM | POA: Diagnosis not present

## 2011-05-11 DIAGNOSIS — Q245 Malformation of coronary vessels: Secondary | ICD-10-CM | POA: Diagnosis not present

## 2011-05-11 DIAGNOSIS — I10 Essential (primary) hypertension: Secondary | ICD-10-CM | POA: Diagnosis not present

## 2011-05-11 DIAGNOSIS — I509 Heart failure, unspecified: Secondary | ICD-10-CM | POA: Diagnosis not present

## 2011-05-11 DIAGNOSIS — J45909 Unspecified asthma, uncomplicated: Secondary | ICD-10-CM | POA: Diagnosis not present

## 2011-05-15 ENCOUNTER — Ambulatory Visit: Payer: Medicare Other | Admitting: Rehabilitative and Restorative Service Providers"

## 2011-05-17 ENCOUNTER — Ambulatory Visit: Payer: Medicare Other | Admitting: Rehabilitation

## 2011-05-22 ENCOUNTER — Ambulatory Visit: Payer: Medicare Other | Admitting: Rehabilitative and Restorative Service Providers"

## 2011-05-24 ENCOUNTER — Ambulatory Visit: Payer: Medicare Other | Admitting: Rehabilitative and Restorative Service Providers"

## 2011-05-25 ENCOUNTER — Encounter: Payer: Medicare Other | Admitting: *Deleted

## 2011-05-29 ENCOUNTER — Encounter: Payer: Self-pay | Admitting: *Deleted

## 2011-05-30 ENCOUNTER — Encounter: Payer: Medicare Other | Admitting: Rehabilitation

## 2011-05-31 ENCOUNTER — Encounter: Payer: Medicare Other | Admitting: Rehabilitation

## 2011-06-06 ENCOUNTER — Ambulatory Visit: Payer: Medicare Other | Admitting: Rehabilitative and Restorative Service Providers"

## 2011-06-08 ENCOUNTER — Encounter: Payer: Medicare Other | Admitting: Rehabilitative and Restorative Service Providers"

## 2011-06-14 DIAGNOSIS — E559 Vitamin D deficiency, unspecified: Secondary | ICD-10-CM | POA: Diagnosis not present

## 2011-06-14 DIAGNOSIS — Q245 Malformation of coronary vessels: Secondary | ICD-10-CM | POA: Diagnosis not present

## 2011-06-14 DIAGNOSIS — I10 Essential (primary) hypertension: Secondary | ICD-10-CM | POA: Diagnosis not present

## 2011-06-14 DIAGNOSIS — E039 Hypothyroidism, unspecified: Secondary | ICD-10-CM | POA: Diagnosis not present

## 2011-06-14 DIAGNOSIS — F329 Major depressive disorder, single episode, unspecified: Secondary | ICD-10-CM | POA: Diagnosis not present

## 2011-06-19 ENCOUNTER — Ambulatory Visit: Payer: Medicare Other | Attending: Internal Medicine | Admitting: Physical Therapy

## 2011-06-19 DIAGNOSIS — R42 Dizziness and giddiness: Secondary | ICD-10-CM | POA: Insufficient documentation

## 2011-06-19 DIAGNOSIS — IMO0001 Reserved for inherently not codable concepts without codable children: Secondary | ICD-10-CM | POA: Insufficient documentation

## 2011-06-19 DIAGNOSIS — R269 Unspecified abnormalities of gait and mobility: Secondary | ICD-10-CM | POA: Insufficient documentation

## 2011-06-21 ENCOUNTER — Ambulatory Visit: Payer: Medicare Other | Admitting: Rehabilitative and Restorative Service Providers"

## 2011-06-21 DIAGNOSIS — R42 Dizziness and giddiness: Secondary | ICD-10-CM | POA: Diagnosis not present

## 2011-06-21 DIAGNOSIS — R269 Unspecified abnormalities of gait and mobility: Secondary | ICD-10-CM | POA: Diagnosis not present

## 2011-06-21 DIAGNOSIS — IMO0001 Reserved for inherently not codable concepts without codable children: Secondary | ICD-10-CM | POA: Diagnosis not present

## 2011-06-26 ENCOUNTER — Encounter: Payer: Medicare Other | Admitting: Rehabilitative and Restorative Service Providers"

## 2011-06-28 ENCOUNTER — Ambulatory Visit: Payer: Medicare Other | Admitting: Rehabilitation

## 2011-07-04 ENCOUNTER — Encounter: Payer: Self-pay | Admitting: Internal Medicine

## 2011-07-04 ENCOUNTER — Ambulatory Visit (INDEPENDENT_AMBULATORY_CARE_PROVIDER_SITE_OTHER): Payer: Medicare Other | Admitting: *Deleted

## 2011-07-04 DIAGNOSIS — I509 Heart failure, unspecified: Secondary | ICD-10-CM | POA: Diagnosis not present

## 2011-07-04 DIAGNOSIS — I428 Other cardiomyopathies: Secondary | ICD-10-CM | POA: Diagnosis not present

## 2011-07-07 LAB — REMOTE ICD DEVICE
AL THRESHOLD: 0.625 V
BATTERY VOLTAGE: 3.1683 V
FVT: 0
LV LEAD IMPEDENCE ICD: 475 Ohm
LV LEAD THRESHOLD: 0.875 V
PACEART VT: 0
RV LEAD THRESHOLD: 0.625 V
TOT-0006: 20120412000000
TZAT-0001ATACH: 3
TZAT-0002ATACH: NEGATIVE
TZAT-0002FASTVT: NEGATIVE
TZAT-0011SLOWVT: 10 ms
TZAT-0011SLOWVT: 10 ms
TZAT-0012ATACH: 150 ms
TZAT-0012ATACH: 150 ms
TZAT-0012SLOWVT: 200 ms
TZAT-0012SLOWVT: 200 ms
TZAT-0013SLOWVT: 1
TZAT-0013SLOWVT: 2
TZAT-0018FASTVT: NEGATIVE
TZAT-0018SLOWVT: NEGATIVE
TZAT-0018SLOWVT: NEGATIVE
TZAT-0019ATACH: 6 V
TZAT-0019ATACH: 6 V
TZAT-0019FASTVT: 8 V
TZAT-0019SLOWVT: 8 V
TZAT-0019SLOWVT: 8 V
TZAT-0020ATACH: 1.5 ms
TZAT-0020ATACH: 1.5 ms
TZAT-0020SLOWVT: 1.5 ms
TZAT-0020SLOWVT: 1.5 ms
TZON-0003ATACH: 350 ms
TZON-0003VSLOWVT: 400 ms
TZON-0004SLOWVT: 32
TZST-0001ATACH: 4
TZST-0001ATACH: 5
TZST-0001ATACH: 6
TZST-0001FASTVT: 5
TZST-0001FASTVT: 6
TZST-0001SLOWVT: 4
TZST-0001SLOWVT: 5
TZST-0002ATACH: NEGATIVE
TZST-0002FASTVT: NEGATIVE
TZST-0002FASTVT: NEGATIVE
TZST-0002FASTVT: NEGATIVE
TZST-0003SLOWVT: 20 J
TZST-0003SLOWVT: 35 J
TZST-0003SLOWVT: 35 J
VENTRICULAR PACING ICD: 99.79 pct
VF: 0

## 2011-07-18 ENCOUNTER — Encounter: Payer: Self-pay | Admitting: *Deleted

## 2011-07-28 NOTE — Progress Notes (Signed)
Remote icd check w/icm  

## 2011-09-05 ENCOUNTER — Other Ambulatory Visit: Payer: Self-pay | Admitting: Internal Medicine

## 2011-10-05 ENCOUNTER — Encounter: Payer: Medicare Other | Admitting: *Deleted

## 2011-10-11 ENCOUNTER — Encounter: Payer: Self-pay | Admitting: *Deleted

## 2011-12-19 DIAGNOSIS — H2510 Age-related nuclear cataract, unspecified eye: Secondary | ICD-10-CM | POA: Diagnosis not present

## 2012-01-03 ENCOUNTER — Encounter: Payer: Self-pay | Admitting: *Deleted

## 2012-01-03 ENCOUNTER — Other Ambulatory Visit: Payer: Self-pay | Admitting: Internal Medicine

## 2012-01-03 DIAGNOSIS — Z1231 Encounter for screening mammogram for malignant neoplasm of breast: Secondary | ICD-10-CM

## 2012-01-11 ENCOUNTER — Encounter: Payer: Self-pay | Admitting: Internal Medicine

## 2012-01-11 ENCOUNTER — Encounter: Payer: Self-pay | Admitting: Cardiology

## 2012-01-11 ENCOUNTER — Ambulatory Visit (INDEPENDENT_AMBULATORY_CARE_PROVIDER_SITE_OTHER): Payer: Medicare Other | Admitting: Cardiology

## 2012-01-11 VITALS — BP 147/69 | HR 59 | Ht 61.0 in | Wt 174.0 lb

## 2012-01-11 DIAGNOSIS — Z95810 Presence of automatic (implantable) cardiac defibrillator: Secondary | ICD-10-CM

## 2012-01-11 DIAGNOSIS — I509 Heart failure, unspecified: Secondary | ICD-10-CM

## 2012-01-11 DIAGNOSIS — I428 Other cardiomyopathies: Secondary | ICD-10-CM

## 2012-01-11 NOTE — Progress Notes (Signed)
ELECTROPHYSIOLOGY OFFICE NOTE  Patient ID: Morgan Clay MRN: [id number], DOB/AGE: 1945/05/11   Date of Visit: 01/11/2012  Primary Physician: Nolene Ebbs, MD  Primary Cardiologist: Jolyn Nap, MD Reason for Visit: Device follow-up  History of Present Illness Morgan Clay is a pleasant 66 year old woman with a nonischemic CM s/p CRT-D who presents today for device follow-up. She reports she has been feeling well, like her usual self, and has no complaints. She denies CP, worsening SOB, palpitations, dizziness, near syncope or syncope. She denies LE swelling, orthopnea or PND. She denies ICD shocks.  Past Medical History Past Medical History  Diagnosis Date  . LBBB (left bundle branch block)   . COPD (chronic obstructive pulmonary disease)   . Nonischemic cardiomyopathy   . Biventricular implantable cardiac defibrillator in situ 12/24/2006    Protecta- Medtronic  ICD gen change 2012  . Chronic systolic congestive heart failure   . Hepatitis C   . CHF (congestive heart failure)   . Hypothyroidism   . Hyperthyroidism   . Personal history of other infectious and parasitic disease     Hepatitis B    Past Surgical History Past Surgical History  Procedure Date  . Appendectomy   . Cardiac catheterization   . [id number]     Concerto 0000000  . Insert / replace / remove pacemaker     concerto     Allergies/Intolerances Allergies  Allergen Reactions  . Oxycodone-Acetaminophen     Percocet    Current Home Medications Current Outpatient Prescriptions  Medication Sig Dispense Refill  . Albuterol Sulfate (VENTOLIN HFA IN) Inhale into the lungs as directed.        [name] aspirin 81 MG tablet Take 81 mg by mouth daily.        [name] atorvastatin (LIPITOR) 40 MG tablet Take 40 mg by mouth daily.        . carvedilol (COREG) 25 MG tablet Take 25 mg by mouth 2 (two) times daily with a meal.        . fluticasone (FLONASE) 50 MCG/ACT nasal spray 2 sprays by Nasal route 2 (two) times daily as  needed.       . furosemide (LASIX) 40 MG tablet Take 40 mg by mouth daily.       [name] levothyroxine (SYNTHROID, LEVOTHROID) 125 MCG tablet Take 125 mcg by mouth daily before breakfast.        . lisinopril (PRINIVIL,ZESTRIL) 20 MG tablet Take 20 mg by mouth 2 (two) times daily.        [name] loratadine (CLARITIN) 10 MG tablet Take 10 mg by mouth daily as needed.       . nitroGLYCERIN (NITROSTAT) 0.4 MG SL tablet Place 0.4 mg under the tongue every 5 (five) minutes as needed.        [name] spironolactone (ALDACTONE) 25 MG tablet TAKE ONE TABLET BY MOUTH EVERY DAY  30 tablet  4  . traZODone (DESYREL) 50 MG tablet Take 50 mg by mouth at bedtime as needed.         Social History Social History  . Marital Status: Single   Social History Main Topics  . Smoking status: Never Smoker   . Smokeless tobacco: Never Used  . Alcohol Use: No  . Drug Use: No   Review of Systems General: No chills, fever, night sweats or weight changes Cardiovascular: No chest pain, dyspnea on exertion, edema, orthopnea, palpitations, paroxysmal nocturnal dyspnea Dermatological: No rash, lesions or masses Respiratory: No cough, dyspnea Urologic:  No hematuria, dysuria Abdominal: No nausea, vomiting, diarrhea, bright red blood per rectum, melena, or hematemesis Neurologic: No visual changes, weakness, changes in mental status All other systems reviewed and are otherwise negative except as noted above.  Physical Exam Blood pressure 147/69, pulse 59, height 5\' 1"  (1.549 m), weight 174 lb (78.926 kg).  General: Well developed, well appearing 66 year old female in no acute distress. HEENT: Normocephalic, atraumatic. EOMs intact. Sclera nonicteric. Oropharynx clear.  Neck: Supple. No JVD. Lungs: Respirations regular and unlabored, CTA bilaterally. No wheezes, rales or rhonchi. Heart: RRR. S1, S2 present. No murmurs, rub, S3 or S4. Abdomen: Soft, non-distended. Extremities: No clubbing, cyanosis or edema. DP/PT/Radials 2+ and equal  bilaterally. Psych: Normal affect. Neuro: Alert and oriented X 3. Moves all extremities spontaneously.   Diagnostics Device interrogation shows normal BiV ICD function with good battery status and stable lead parameters/measurements; no VT/VF episodes; no programming changes made; see PaceArt report  Assessment and Plan 1. Nonischemic CM with chronic systolic HF s/p CRT-D - device function normal; no programming changes made; continue remote device checks every 3 months; follow-up with Dr. Caryl Comes in one year unless needed sooner.  2. Chronic systolic HF - euvolemic by exam today; continue medical therapy with carvedilol and lisinopril; continue low sodium diet and daily weights  Signed, Mercadies Co, PA-C 01/11/2012, 3:12 PM

## 2012-01-11 NOTE — Patient Instructions (Addendum)
Remote monitoring is used to monitor your Pacemaker of ICD from home. This monitoring reduces the number of office visits required to check your device to one time per year. It allows [name] to keep an eye on the functioning of your device to ensure it is working properly. You are scheduled for a device check from home on April 15, 2012. You may send your transmission at any time that day. If you have a wireless device, the transmission will be sent automatically. After your physician reviews your transmission, you will receive a postcard with your next transmission date.  Your physician wants you to follow-up in: 1 year with Dr Caryl Comes.  You will receive a reminder letter in the mail two months in advance. If you don't receive a letter, please call our office to schedule the follow-up appointment.

## 2012-01-17 ENCOUNTER — Ambulatory Visit
Admission: RE | Admit: 2012-01-17 | Discharge: 2012-01-17 | Disposition: A | Discharge status: Home / Self Care | Payer: Medicare Other | Source: Ambulatory Visit | Attending: Internal Medicine | Admitting: Internal Medicine

## 2012-01-17 DIAGNOSIS — Z1231 Encounter for screening mammogram for malignant neoplasm of breast: Secondary | ICD-10-CM

## 2012-01-18 ENCOUNTER — Ambulatory Visit: Payer: Medicare Other

## 2012-01-24 LAB — ICD DEVICE OBSERVATION
AL IMPEDENCE ICD: 475 Ohm
AL THRESHOLD: 0.75 V
BAMS-0001: 170 {beats}/min
LV LEAD IMPEDENCE ICD: 475 Ohm
RV LEAD AMPLITUDE: 20 mv
RV LEAD THRESHOLD: 0.75 V
TZAT-0001ATACH: 1
TZAT-0001ATACH: 2
TZAT-0001SLOWVT: 1
TZAT-0001SLOWVT: 2
TZAT-0002ATACH: NEGATIVE
TZAT-0002ATACH: NEGATIVE
TZAT-0002FASTVT: NEGATIVE
TZAT-0004SLOWVT: 8
TZAT-0004SLOWVT: 8
TZAT-0005SLOWVT: 88 pct
TZAT-0005SLOWVT: 91 pct
TZAT-0012ATACH: 150 ms
TZAT-0012ATACH: 150 ms
TZAT-0013SLOWVT: 2
TZAT-0018ATACH: NEGATIVE
TZAT-0018ATACH: NEGATIVE
TZAT-0018ATACH: NEGATIVE
TZAT-0018FASTVT: NEGATIVE
TZAT-0019ATACH: 6 V
TZAT-0019FASTVT: 8 V
TZAT-0020ATACH: 1.5 ms
TZAT-0020FASTVT: 1.5 ms
TZON-0003ATACH: 350 ms
TZON-0004SLOWVT: 32
TZON-0004VSLOWVT: 36
TZST-0001FASTVT: 2
TZST-0001FASTVT: 6
TZST-0001SLOWVT: 3
TZST-0001SLOWVT: 6
TZST-0002ATACH: NEGATIVE
TZST-0002ATACH: NEGATIVE
TZST-0002FASTVT: NEGATIVE
TZST-0002FASTVT: NEGATIVE
TZST-0002FASTVT: NEGATIVE
TZST-0003SLOWVT: 20 J
TZST-0003SLOWVT: 35 J

## 2012-01-30 DIAGNOSIS — H2510 Age-related nuclear cataract, unspecified eye: Secondary | ICD-10-CM | POA: Diagnosis not present

## 2012-03-29 ENCOUNTER — Other Ambulatory Visit: Payer: Self-pay | Admitting: *Deleted

## 2012-03-29 MED ORDER — SPIRONOLACTONE 25 MG PO TABS: 25.0000 mg | ORAL_TABLET | Freq: Every day | ORAL | Status: DC

## 2012-04-15 ENCOUNTER — Encounter: Payer: Medicare Other | Admitting: *Deleted

## 2012-04-25 ENCOUNTER — Encounter: Payer: Self-pay | Admitting: *Deleted

## 2012-04-29 ENCOUNTER — Other Ambulatory Visit: Payer: Self-pay | Admitting: Internal Medicine

## 2012-05-21 ENCOUNTER — Other Ambulatory Visit (HOSPITAL_COMMUNITY): Payer: Self-pay | Admitting: Internal Medicine

## 2012-05-21 DIAGNOSIS — N924 Excessive bleeding in the premenopausal period: Secondary | ICD-10-CM

## 2012-05-21 DIAGNOSIS — Z780 Asymptomatic menopausal state: Secondary | ICD-10-CM

## 2012-05-28 ENCOUNTER — Ambulatory Visit (HOSPITAL_COMMUNITY)
Admission: RE | Admit: 2012-05-28 | Discharge: 2012-05-28 | Disposition: A | Discharge status: Home / Self Care | Payer: Medicare Other | Source: Ambulatory Visit | Attending: Internal Medicine | Admitting: Internal Medicine

## 2012-05-28 DIAGNOSIS — Z780 Asymptomatic menopausal state: Secondary | ICD-10-CM | POA: Insufficient documentation

## 2012-05-28 DIAGNOSIS — Z13820 Encounter for screening for osteoporosis: Secondary | ICD-10-CM | POA: Insufficient documentation

## 2012-05-29 ENCOUNTER — Telehealth: Payer: Self-pay | Admitting: Internal Medicine

## 2012-05-29 NOTE — Telephone Encounter (Signed)
Pt has resent her transmission and was suppose to hear from [place] but she has not as of yet so she was calling to see if we got it yet

## 2012-05-30 NOTE — Telephone Encounter (Signed)
Spoke w/pt and try to resend transmission.

## 2012-06-07 ENCOUNTER — Encounter: Payer: Self-pay | Admitting: *Deleted

## 2012-07-04 ENCOUNTER — Ambulatory Visit (INDEPENDENT_AMBULATORY_CARE_PROVIDER_SITE_OTHER): Payer: Medicare Other | Admitting: Cardiology

## 2012-07-04 ENCOUNTER — Encounter: Payer: Self-pay | Admitting: Internal Medicine

## 2012-07-04 DIAGNOSIS — I428 Other cardiomyopathies: Secondary | ICD-10-CM

## 2012-07-04 DIAGNOSIS — I5022 Chronic systolic (congestive) heart failure: Secondary | ICD-10-CM

## 2012-07-04 DIAGNOSIS — Z95810 Presence of automatic (implantable) cardiac defibrillator: Secondary | ICD-10-CM

## 2012-07-04 LAB — ICD DEVICE OBSERVATION
BAMS-0001: 170 {beats}/min
BATTERY VOLTAGE: 3.1 V
FVT: 0
LV LEAD THRESHOLD: 0.75 V
PACEART VT: 0
RV LEAD IMPEDENCE ICD: 513 Ohm
RV LEAD THRESHOLD: 0.75 V
TZAT-0001ATACH: 1
TZAT-0002ATACH: NEGATIVE
TZAT-0002ATACH: NEGATIVE
TZAT-0005SLOWVT: 88 pct
TZAT-0005SLOWVT: 91 pct
TZAT-0011SLOWVT: 10 ms
TZAT-0011SLOWVT: 10 ms
TZAT-0012ATACH: 150 ms
TZAT-0012SLOWVT: 200 ms
TZAT-0012SLOWVT: 200 ms
TZAT-0018SLOWVT: NEGATIVE
TZAT-0019ATACH: 6 V
TZAT-0019ATACH: 6 V
TZAT-0019ATACH: 6 V
TZAT-0019FASTVT: 8 V
TZAT-0019SLOWVT: 8 V
TZAT-0019SLOWVT: 8 V
TZAT-0020ATACH: 1.5 ms
TZAT-0020FASTVT: 1.5 ms
TZON-0003ATACH: 350 ms
TZON-0003SLOWVT: 330 ms
TZON-0005SLOWVT: 12
TZST-0001ATACH: 4
TZST-0001ATACH: 5
TZST-0001FASTVT: 2
TZST-0001FASTVT: 3
TZST-0001SLOWVT: 4
TZST-0001SLOWVT: 6
TZST-0002FASTVT: NEGATIVE
TZST-0002FASTVT: NEGATIVE
TZST-0003SLOWVT: 20 J
TZST-0003SLOWVT: 35 J
TZST-0003SLOWVT: 35 J
VF: 0

## 2012-07-04 MED ORDER — FUROSEMIDE 40 MG PO TABS: 40.0000 mg | ORAL_TABLET | Freq: Every day | ORAL | Status: DC

## 2012-07-04 MED ORDER — ATORVASTATIN CALCIUM 40 MG PO TABS: 40.0000 mg | ORAL_TABLET | Freq: Every day | ORAL | Status: DC

## 2012-07-04 MED ORDER — SPIRONOLACTONE 25 MG PO TABS: 25.0000 mg | ORAL_TABLET | Freq: Every day | ORAL | Status: DC

## 2012-07-04 MED ORDER — LISINOPRIL 20 MG PO TABS: 20.0000 mg | ORAL_TABLET | Freq: Two times a day (BID) | ORAL | Status: DC

## 2012-07-04 MED ORDER — NITROGLYCERIN 0.4 MG SL SUBL: 0.4000 mg | SUBLINGUAL_TABLET | SUBLINGUAL | Status: DC | PRN

## 2012-07-04 MED ORDER — CARVEDILOL 25 MG PO TABS: 25.0000 mg | ORAL_TABLET | Freq: Two times a day (BID) | ORAL | Status: DC

## 2012-07-04 NOTE — Progress Notes (Signed)
BiV ICD check/device clinic only. See PaceArt report. 

## 2012-10-07 ENCOUNTER — Ambulatory Visit (INDEPENDENT_AMBULATORY_CARE_PROVIDER_SITE_OTHER): Payer: Medicare Other | Admitting: *Deleted

## 2012-10-07 ENCOUNTER — Encounter: Payer: Self-pay | Admitting: Internal Medicine

## 2012-10-07 DIAGNOSIS — I509 Heart failure, unspecified: Secondary | ICD-10-CM

## 2012-10-07 DIAGNOSIS — Z95810 Presence of automatic (implantable) cardiac defibrillator: Secondary | ICD-10-CM

## 2012-10-07 DIAGNOSIS — I428 Other cardiomyopathies: Secondary | ICD-10-CM

## 2012-10-07 LAB — REMOTE ICD DEVICE
AL AMPLITUDE: 4.3 mv
FVT: 0
LV LEAD THRESHOLD: 1 V
RV LEAD AMPLITUDE: 20 mv
RV LEAD IMPEDENCE ICD: 513 Ohm
RV LEAD THRESHOLD: 0.75 V
TZAT-0002ATACH: NEGATIVE
TZAT-0002ATACH: NEGATIVE
TZAT-0011SLOWVT: 10 ms
TZAT-0011SLOWVT: 10 ms
TZAT-0012ATACH: 150 ms
TZAT-0012FASTVT: 200 ms
TZAT-0018FASTVT: NEGATIVE
TZAT-0019ATACH: 6 V
TZAT-0019ATACH: 6 V
TZAT-0019FASTVT: 8 V
TZAT-0019SLOWVT: 8 V
TZAT-0019SLOWVT: 8 V
TZAT-0020ATACH: 1.5 ms
TZAT-0020ATACH: 1.5 ms
TZAT-0020FASTVT: 1.5 ms
TZAT-0020SLOWVT: 1.5 ms
TZON-0003VSLOWVT: 400 ms
TZON-0004VSLOWVT: 36
TZST-0001FASTVT: 5
TZST-0001SLOWVT: 3
TZST-0001SLOWVT: 4
TZST-0001SLOWVT: 5
TZST-0002ATACH: NEGATIVE
TZST-0002ATACH: NEGATIVE
TZST-0002FASTVT: NEGATIVE
TZST-0002FASTVT: NEGATIVE
TZST-0003SLOWVT: 20 J
TZST-0003SLOWVT: 35 J
VF: 0

## 2012-10-31 ENCOUNTER — Encounter: Payer: Self-pay | Admitting: *Deleted

## 2012-12-06 ENCOUNTER — Encounter: Payer: Self-pay | Admitting: Family Medicine

## 2012-12-06 ENCOUNTER — Ambulatory Visit (INDEPENDENT_AMBULATORY_CARE_PROVIDER_SITE_OTHER): Payer: Medicare Other | Admitting: Family Medicine

## 2012-12-06 VITALS — BP 210/90 | Temp 97.7°F | Ht 59.5 in | Wt 175.0 lb

## 2012-12-06 DIAGNOSIS — Z7689 Persons encountering health services in other specified circumstances: Secondary | ICD-10-CM

## 2012-12-06 DIAGNOSIS — F329 Major depressive disorder, single episode, unspecified: Secondary | ICD-10-CM

## 2012-12-06 DIAGNOSIS — I428 Other cardiomyopathies: Secondary | ICD-10-CM

## 2012-12-06 DIAGNOSIS — Z7189 Other specified counseling: Secondary | ICD-10-CM

## 2012-12-06 DIAGNOSIS — J449 Chronic obstructive pulmonary disease, unspecified: Secondary | ICD-10-CM

## 2012-12-06 DIAGNOSIS — E039 Hypothyroidism, unspecified: Secondary | ICD-10-CM

## 2012-12-06 DIAGNOSIS — E785 Hyperlipidemia, unspecified: Secondary | ICD-10-CM

## 2012-12-06 DIAGNOSIS — F3289 Other specified depressive episodes: Secondary | ICD-10-CM

## 2012-12-06 DIAGNOSIS — E669 Obesity, unspecified: Secondary | ICD-10-CM

## 2012-12-06 DIAGNOSIS — I509 Heart failure, unspecified: Secondary | ICD-10-CM

## 2012-12-06 DIAGNOSIS — I10 Essential (primary) hypertension: Secondary | ICD-10-CM

## 2012-12-06 HISTORY — DX: Hyperlipidemia, unspecified: E78.5

## 2012-12-06 MED ORDER — LEVOTHYROXINE SODIUM 125 MCG PO TABS: 125.0000 ug | ORAL_TABLET | Freq: Every day | ORAL | Status: DC

## 2012-12-06 NOTE — Patient Instructions (Addendum)
-  PLEASE SIGN UP FOR MYCHART TODAY   We recommend the following healthy lifestyle measures: - eat a healthy diet consisting of lots of vegetables, fruits, beans, nuts, seeds, healthy meats such as white chicken and fish and whole grains.  - avoid fried foods, fast food, processed foods, sodas, red meet and other fattening foods.  - get a least 150 minutes of aerobic exercise per week.   We will work up getting records from your last doctor  Please find out where you had your last colonoscopy  Follow up in: follow up for a morning appointment sometime in the next 1-2 months for a complete physical with pap and breast exam and for fasting labs

## 2012-12-06 NOTE — Progress Notes (Signed)
Chief Complaint  Patient presents with  . Establish Care    HPI:  Morgan Clay is here to establish care. Wasn't happy with prior PCP (Dr. Jeanie Cooks) - seen recently for follow up. She reports labs about three months ago - thinks they check thyroid then and increased synthroid. Last PCP and physical: has not had pap smear in about 9 years; reports all normal in the past  Has the following chronic problems and concerns today:  Patient Active Problem List   Diagnosis Date Noted  . Hyperlipemia 12/06/2012  . Nonischemic cardiomyopathy 11/22/2010  . DEPRESSION 12/17/2009  . OBESITY 05/27/2009  . OSTEOPENIA 09/30/2008  . OBSTRUCTIVE SLEEP APNEA 11/14/2007  . HYPERTENSION, BENIGN 04/24/2007  . CONGESTIVE HEART FAILURE 04/24/2007  . COPD 04/24/2007  . HYPOTHYROIDISM NOS 01/28/2007  . ICD-CRT-Medtronic 01/08/2007  . HEPATITIS C 01/04/2007   Depression/Insomnia: -on zoloft and trazadone for 2 years and stable  CHF, HTN, HLD, Cardiomyopathy: -followed by cardiology -on asa, lipitor, coreg, lasix, lisinopril, spironolactone -has ICD -doing well, denies: CP, palpitation, SOB, DOE, swelling  COPD: -uses albuterol as needed - uses   OSA: -mild per lat evaluation, told to work weight loss -no regular exercise; diet is so so  Glaucoma: -mild per pt report -sees optho; Dr. Herbert Deaner  Hepatitis C or B: -listed on PMH -reports prior PCP this year told her might have had it and told had work up but never had medication for this, not sure  Health Maintenance:  ROS: See pertinent positives and negatives per HPI.  Past Medical History  Diagnosis Date  . LBBB (left bundle branch block)   . COPD (chronic obstructive pulmonary disease)   . Nonischemic cardiomyopathy     Followed by [facility]  . Biventricular implantable cardiac defibrillator in situ 12/24/2006    Protecta- Medtronic  ICD gen change 2012  . Chronic systolic congestive heart failure   . Hepatitis C   .  CHF (congestive heart failure)   . Hypothyroidism   . Personal history of other infectious and parasitic disease     Hepatitis B  . CONSTIPATION 02/28/2008    Qualifier: Diagnosis of  By: Hassell Done FNP, Tori Milks    . FIBROIDS, UTERUS 03/05/2008    Qualifier: Diagnosis of  By: Isla Pence    . OBSTRUCTIVE SLEEP APNEA 11/14/2007    npsg 2009:  Mild osa with AHI 11/hr. Started cpap 2009, quit using due to lack of response Home sleep testing 09/2010 with AHI only 7/hr and weight loss advised by pulmonology.  . OSTEOPENIA 09/30/2008    Qualifier: Diagnosis of  By: Hassell Done FNP, Tori Milks    . OBESITY 05/27/2009    Qualifier: Diagnosis of  By: Hassell Done FNP, Tori Milks    . DEPRESSION 12/17/2009    Annotation: PHQ-9 score = 14 done on 12/17/2009 Qualifier: Diagnosis of  By: Hassell Done FNP, Tori Milks    . Hyperlipemia 12/06/2012  . HYPERTENSION, BENIGN 04/24/2007    Qualifier: Diagnosis of  By: Hassell Done FNP, Tori Milks    . Asthma   . Arthritis   . Glaucoma     Family History  Problem Relation Age of Onset  . Asthma Father   . Asthma Sister   . Cancer Sister     lung    History   Social History  . Marital Status: Single    Spouse Name: N/A    Number of Children: N/A  . Years of Education: N/A   Social History Main Topics  . Smoking status: Never Smoker   .  Smokeless tobacco: Never Used  . Alcohol Use: Yes     Comment: 3 glasses of wine 3-4 times per week  . Drug Use: None  . Sexually Active: None   Other Topics Concern  . None   Social History Narrative   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      Home Situation: lives alone      Spiritual Beliefs: [place]      Lifestyle: no regular exercise; poor diet             Current outpatient prescriptions:aspirin 81 MG tablet, Take 81 mg by mouth daily.  , Disp: , Rfl: ;  atorvastatin (LIPITOR) 40 MG tablet, Take 1 tablet (40 mg total) by mouth daily., Disp: 90 tablet, Rfl: 3;  carvedilol (COREG) 25 MG tablet, Take 1 tablet  (25 mg total) by mouth 2 (two) times daily with a meal., Disp: 180 tablet, Rfl: 3;  furosemide (LASIX) 40 MG tablet, Take 1 tablet (40 mg total) by mouth daily., Disp: 90 tablet, Rfl: 3 levothyroxine (SYNTHROID, LEVOTHROID) 125 MCG tablet, Take 1 tablet (125 mcg total) by mouth daily before breakfast., Disp: 30 tablet, Rfl: 3;  lisinopril (PRINIVIL,ZESTRIL) 20 MG tablet, Take 1 tablet (20 mg total) by mouth 2 (two) times daily., Disp: 180 tablet, Rfl: 3;  nitroGLYCERIN (NITROSTAT) 0.4 MG SL tablet, Place 1 tablet (0.4 mg total) under the tongue every 5 (five) minutes as needed., Disp: 25 tablet, Rfl: 6 sertraline (ZOLOFT) 100 MG tablet, , Disp: , Rfl: ;  spironolactone (ALDACTONE) 25 MG tablet, Take 1 tablet (25 mg total) by mouth daily., Disp: 90 tablet, Rfl: 3;  traZODone (DESYREL) 50 MG tablet, Take 50 mg by mouth at bedtime as needed. , Disp: , Rfl: ;  Albuterol Sulfate (VENTOLIN HFA IN), Inhale into the lungs as directed.  , Disp: , Rfl:  sertraline (ZOLOFT) 100 MG tablet, TAKE ONE TABLET BY MOUTH EVERY DAY AT BEDTIME, Disp: 30 tablet, Rfl: 11  EXAM:  Filed Vitals:   12/06/12 1439  BP: 210/90  Temp: 97.7 F (36.5 C)    Body mass index is 34.77 kg/(m^2).  GENERAL: vitals reviewed and listed above, alert, oriented, appears well hydrated and in no acute distress  HEENT: atraumatic, conjunttiva clear, no obvious abnormalities on inspection of external nose and ears  NECK: no obvious masses on inspection  LUNGS: clear to auscultation bilaterally, no wheezes, rales or rhonchi, good air movement  CV: HRRR, no peripheral edema  MS: moves all extremities without noticeable abnormality  PSYCH: pleasant and cooperative, no obvious depression or anxiety  ASSESSMENT AND PLAN:  Discussed the following assessment and plan:  Hyperlipemia  HYPOTHYROIDISM NOS - Plan: levothyroxine (SYNTHROID, LEVOTHROID) 125 MCG tablet  OBESITY  DEPRESSION  HYPERTENSION, BENIGN  CONGESTIVE HEART  FAILURE  COPD  Nonischemic cardiomyopathy  Encounter to establish care  -We reviewed the PMH, PSH, FH, SH, Meds and Allergies. -We provided refills for any medications we will prescribe as needed. -We addressed current concerns per orders and patient instructions. -We have asked for records for pertinent exams, studies, vaccines and notes from previous providers. -We have advised patient to follow up per instructions below. -need to get records then check fasting labs and see if any furthe rworkup needed regarding hep on PMH - sounds like may have had workup and not chronic but will get records. -she will follow up once we get records for physical, pap, breast exam, labs -Patient advised to return or notify a  doctor immediately if symptoms worsen or persist or new concerns arise.  There are no Patient Instructions on file for this visit.   [name]

## 2013-01-01 ENCOUNTER — Encounter: Payer: Self-pay | Admitting: Family Medicine

## 2013-01-01 ENCOUNTER — Telehealth: Payer: Self-pay | Admitting: Family Medicine

## 2013-01-01 DIAGNOSIS — B1710 Acute hepatitis C without hepatic coma: Secondary | ICD-10-CM

## 2013-01-01 NOTE — Telephone Encounter (Signed)
Please call patient and advise:  1) received records from prior PCP and patient referred to hepatology for her Hep C in the past. I don't think she saw the hepatologist? Please tell her I STRONGLY advise her to see the hepatologist and I paced a referral.  2)advise her she needs to schedule physical exam with me

## 2013-01-01 NOTE — Progress Notes (Signed)
REceived some OV notes and labs from Prior PCP. Placed in scan box.  Summary: Hep labs: Mild elevated LFT in 2012 and 2013 Hep Panel 02/2012: Hep B sant and hep B core ab Igm negative Hep A antibody IgM negative Hep C antibody positive  HCV RNA quant reflex genotype 03/2012 HCV RNA quant [id number] hCVRNA log 7.12 log 10  REFERRED to hepatologist per PCP notes from 05/14/12, but patient had missed appt - per PCP notes advised to reschedule.  TSH 09/2012 5.335 and Free T4 1.03, Vit D 20   Will send note to nurse to advise patient to see hepatology and place referral and advise her to schedule follow up with me for a CPE and other medical problems.

## 2013-01-02 NOTE — Telephone Encounter (Signed)
Called and spoke with pt and pt is aware.  

## 2013-01-08 ENCOUNTER — Other Ambulatory Visit (HOSPITAL_COMMUNITY)
Admission: RE | Admit: 2013-01-08 | Discharge: 2013-01-08 | Disposition: A | Discharge status: Home / Self Care | Payer: Medicare Other | Source: Ambulatory Visit | Attending: Family Medicine | Admitting: Family Medicine

## 2013-01-08 ENCOUNTER — Encounter: Payer: Self-pay | Admitting: Family Medicine

## 2013-01-08 ENCOUNTER — Ambulatory Visit (INDEPENDENT_AMBULATORY_CARE_PROVIDER_SITE_OTHER): Payer: Medicare Other | Admitting: Family Medicine

## 2013-01-08 VITALS — BP 140/72 | Temp 98.1°F | Wt 170.0 lb

## 2013-01-08 DIAGNOSIS — Z23 Encounter for immunization: Secondary | ICD-10-CM

## 2013-01-08 DIAGNOSIS — E559 Vitamin D deficiency, unspecified: Secondary | ICD-10-CM

## 2013-01-08 DIAGNOSIS — I10 Essential (primary) hypertension: Secondary | ICD-10-CM

## 2013-01-08 DIAGNOSIS — Z1151 Encounter for screening for human papillomavirus (HPV): Secondary | ICD-10-CM | POA: Insufficient documentation

## 2013-01-08 DIAGNOSIS — E039 Hypothyroidism, unspecified: Secondary | ICD-10-CM

## 2013-01-08 DIAGNOSIS — L738 Other specified follicular disorders: Secondary | ICD-10-CM

## 2013-01-08 DIAGNOSIS — Z0000 Encounter for general adult medical examination without abnormal findings: Secondary | ICD-10-CM

## 2013-01-08 DIAGNOSIS — Z01419 Encounter for gynecological examination (general) (routine) without abnormal findings: Secondary | ICD-10-CM | POA: Insufficient documentation

## 2013-01-08 DIAGNOSIS — E785 Hyperlipidemia, unspecified: Secondary | ICD-10-CM

## 2013-01-08 DIAGNOSIS — Z131 Encounter for screening for diabetes mellitus: Secondary | ICD-10-CM

## 2013-01-08 DIAGNOSIS — B1710 Acute hepatitis C without hepatic coma: Secondary | ICD-10-CM

## 2013-01-08 DIAGNOSIS — I509 Heart failure, unspecified: Secondary | ICD-10-CM

## 2013-01-08 DIAGNOSIS — L853 Xerosis cutis: Secondary | ICD-10-CM

## 2013-01-08 LAB — LIPID PANEL
Cholesterol: 147 mg/dL (ref 0–200)
HDL: 61.7 mg/dL (ref >39.00)
LDL Cholesterol: 55 mg/dL (ref 0–99)
Total CHOL/HDL Ratio: 2
Triglycerides: 151 mg/dL — ABNORMAL HIGH (ref 0.0–149.0)
VLDL: 30.2 mg/dL (ref 0.0–40.0)

## 2013-01-08 LAB — COMPREHENSIVE METABOLIC PANEL
ALT: 138 U/L — ABNORMAL HIGH (ref 0–35)
Albumin: 4 g/dL (ref 3.5–5.2)
Alkaline Phosphatase: 78 U/L (ref 39–117)
CO2: 26 mEq/L (ref 19–32)
Glucose, Bld: 129 mg/dL — ABNORMAL HIGH (ref 70–99)
Potassium: 3.9 mEq/L (ref 3.5–5.1)
Sodium: 136 mEq/L (ref 135–145)
Total Protein: 8.4 g/dL — ABNORMAL HIGH (ref 6.0–8.3)

## 2013-01-08 LAB — HEMOGLOBIN A1C: Hgb A1c MFr Bld: 6.8 % — ABNORMAL HIGH (ref 4.6–6.5)

## 2013-01-08 NOTE — Progress Notes (Signed)
Chief Complaint  Patient presents with  . Annual Exam    HPI:  Here for CPE:  -Concerns today:  1)Hep C status per records: -per records referred to hepatology by prior PCP but she did not go -I placed referral to hepatology here -denies: fevers, RUQ pain, nausea, vomiting, bowel changes  2)CAD/HLD/HTN: -followed by cards -denies: CP, SOB, palpitations, swelling  3)Hypothyroid: -on synthroid -last TSH 5.335 in May 2014  4)Vit D def: -on labs from prior PCP in May 2014 -taking 2000 IU Vit per her report  5)Dry skin: -itchy, chronic  -Diet: poor diet  -Exercise: no regular exercise now  -Diabetes and Dyslipidemia Screening: will check labs today  -Hx of HTN: treated  -Vaccines: needs flu and shingles vaccine   -pap history: postmenopausal, can not remember last pap, no bleeding  -FDLMP: postmenopausal  -sexual activity: not sexually active  -wants STI testing: no  -FH breast, colon or ovarian ca: see FH -reports UTD on mammogram report had in 06/2012 and colonsocopy  -Alcohol, Tobacco, drug use: see social history -she has cut back to 1 glass of wine about 3 times per week -does not smoke  Review of Systems - Review of Systems  Constitutional: Negative for fever and weight loss.  HENT: Negative for hearing loss.   Eyes: Negative for blurred vision.  Respiratory: Negative for shortness of breath.   Cardiovascular: Negative for chest pain.  Gastrointestinal: Negative for blood in stool and melena.  Genitourinary: Negative for dysuria.  Musculoskeletal: Negative for joint pain.  Neurological: Negative for dizziness, seizures and headaches.  Endo/Heme/Allergies: Does not bruise/bleed easily.  Psychiatric/Behavioral: Negative for depression and memory loss.     Past Medical History  Diagnosis Date  . LBBB (left bundle branch block)   . COPD (chronic obstructive pulmonary disease)   . Nonischemic cardiomyopathy     Followed by [facility]  .  Biventricular implantable cardiac defibrillator in situ 12/24/2006    Protecta- Medtronic  ICD gen change 2012  . Chronic systolic congestive heart failure   . Hepatitis C   . CHF (congestive heart failure)   . Hypothyroidism   . Personal history of other infectious and parasitic disease     Hepatitis B  . CONSTIPATION 02/28/2008    Qualifier: Diagnosis of  By: Hassell Done FNP, Tori Milks    . FIBROIDS, UTERUS 03/05/2008    Qualifier: Diagnosis of  By: Isla Pence    . OBSTRUCTIVE SLEEP APNEA 11/14/2007    npsg 2009:  Mild osa with AHI 11/hr. Started cpap 2009, quit using due to lack of response Home sleep testing 09/2010 with AHI only 7/hr and weight loss advised by pulmonology.  . OSTEOPENIA 09/30/2008    Qualifier: Diagnosis of  By: Hassell Done FNP, Tori Milks    . OBESITY 05/27/2009    Qualifier: Diagnosis of  By: Hassell Done FNP, Tori Milks    . DEPRESSION 12/17/2009    Annotation: PHQ-9 score = 14 done on 12/17/2009 Qualifier: Diagnosis of  By: Hassell Done FNP, Tori Milks    . Hyperlipemia 12/06/2012  . HYPERTENSION, BENIGN 04/24/2007    Qualifier: Diagnosis of  By: Hassell Done FNP, Tori Milks    . Asthma   . Arthritis   . Glaucoma     Past Surgical History  Procedure Laterality Date  . Appendectomy    . Cardiac catheterization    . [id number]      Concerto 0000000  . Insert / replace / remove pacemaker      concerto    Family History  Problem Relation Age of Onset  . Asthma Father   . Asthma Sister   . Cancer Sister     lung    History   Social History  . Marital Status: Single    Spouse Name: N/A    Number of Children: N/A  . Years of Education: N/A   Social History Main Topics  . Smoking status: Never Smoker   . Smokeless tobacco: Never Used  . Alcohol Use: Yes     Comment: 3 glasses of wine 3-4 times per week  . Drug Use: None  . Sexual Activity: None   Other Topics Concern  . 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  , Disp: , Rfl: ;  aspirin 81 MG tablet, Take 81 mg by mouth daily.  , Disp: , Rfl: ;  atorvastatin (LIPITOR) 40 MG tablet, Take 1 tablet (40 mg total) by mouth daily., Disp: 90 tablet, Rfl: 3;  carvedilol (COREG) 25 MG tablet, Take 1 tablet (25 mg total) by mouth 2 (two) times daily with a meal., Disp: 180 tablet, Rfl: 3 furosemide (LASIX) 40 MG tablet, Take 1 tablet (40 mg total) by mouth daily., Disp: 90 tablet, Rfl: 3;  levothyroxine (SYNTHROID, LEVOTHROID) 125 MCG tablet, Take 1 tablet (125 mcg total) by mouth daily before breakfast., Disp: 30 tablet, Rfl: 3;  lisinopril (PRINIVIL,ZESTRIL) 20 MG tablet, Take 1 tablet (20 mg total) by mouth 2 (two) times daily., Disp: 180 tablet, Rfl: 3 nitroGLYCERIN (NITROSTAT) 0.4 MG SL tablet, Place 1 tablet (0.4 mg total) under the tongue every 5 (five) minutes as needed., Disp: 25 tablet, Rfl: 6;  sertraline (ZOLOFT) 100 MG tablet, , Disp: , Rfl: ;  spironolactone (ALDACTONE) 25 MG tablet, Take 1 tablet (25 mg total) by mouth daily., Disp: 90 tablet, Rfl: 3;  traZODone (DESYREL) 50 MG tablet, Take 50 mg by mouth at bedtime as needed. , Disp: , Rfl:  sertraline (ZOLOFT) 100 MG tablet, TAKE ONE TABLET BY MOUTH EVERY DAY AT BEDTIME, Disp: 30 tablet, Rfl: 11  EXAM:  Filed Vitals:   01/08/13 0819  BP: 140/72  Temp: 98.1 F (36.7 C)    GENERAL: vitals reviewed and listed below, alert, oriented, appears well hydrated and in no acute distress  HEENT: head atraumatic, PERRLA, normal appearance of eyes, ears, nose and mouth. moist mucus membranes.  NECK: supple, no masses or lymphadenopathy  LUNGS: clear to auscultation bilaterally, no rales, rhonchi or wheeze  CV: HRRR, no peripheral edema or cyanosis, normal pedal pulses  BREAST: normal  appearance - no lesions or discharge, on palpation normal breast tissue without any suspicious masses  ABDOMEN: bowel sounds normal, soft, non tender to palpation, no masses, no rebound or guarding  GU: normal appearance of external genitalia - no lesions or masses, normal vaginal mucosa - no abnormal discharge, normal appearance of cervix - no lesions or abnormal discharge, no masses or tenderness on palpation of uterus and ovaries.  RECTAL: refused  SKIN: dry skin, scattered excoriated papules  MS: normal gait, moves all extremities normally  NEURO: CN II-XII grossly intact, normal muscle strength and sensation to light touch on  extremities  PSYCH: normal affect, pleasant and cooperative  ASSESSMENT AND PLAN:  Discussed the following assessment and plan:  Visit for preventive health examination - Plan: Hemoglobin A1c, Cytology - PAP  HEPATITIS C - Plan: CMP  HYPOTHYROIDISM NOS - Plan: TSH, T4, Free  HYPERTENSION, BENIGN - Plan: Hemoglobin A1c  CONGESTIVE HEART FAILURE  Hyperlipemia - Plan: Hemoglobin A1c, Lipid Panel  Unspecified vitamin D deficiency - Plan: Vitamin D, 25-hydroxy  Dry skin   -Discussed and advised all [name] preventive services health task force level A and B recommendations for age, sex and risks.  -Advised at least 150 minutes of exercise per week and a healthy diet low in saturated fats and sweets and consisting of fresh fruits and vegetables, lean meats such as fish and white chicken and whole grains.  -advised strongly to see hepatology for her hep c and eval/tx  -advised pap, shingles and zostavax - done today  -advised mammogram   -FASTING LABS  -labs, studies and vaccines per orders this encounter  Orders Placed This Encounter  Procedures  . CMP  . Hemoglobin A1c  . TSH  . T4, Free  . Lipid Panel  . Vitamin D, 25-hydroxy    Patient Instructions  -We placed a referral for you as discussed to the liver specialist (hepatologist) for  your hepatitis. IT WILL BE VERY IMPORTANT THAT YOU SEE THIS DOCTOR.  It usually takes about 1-2 weeks to process and schedule this referral. If you have not heard from [name] regarding this appointment in 2 weeks please contact our office.  -We have ordered labs or and a pap smear at this visit. It can take up to 1-2 weeks for results and processing. We will contact you with instructions IF your results are abnormal. Normal results will be released to your [facility]. If you have not heard from [name] or can not find your results in [facility] in 2 weeks please contact our office.  -We recommend the following healthy lifestyle measures: - eat a healthy diet consisting of lots of vegetables, fruits, beans, nuts, seeds, healthy meats such as white chicken and fish and whole grains.  - avoid fried foods, fast food, processed foods, sodas, red meet and other fattening foods.  - get a least 150 minutes of aerobic exercise per week.   Please check to see when your next mammogram is due and have the results of your most recent mammogram forwarded to [name].  Use CERAVE CREAM on your skin daily, if not better in one month please call to set up appointment with dermatologist per information provided  Follow up in 3 months            Patient advised to return to clinic immediately if symptoms worsen or persist or new concerns.  No Follow-up on file.  [name]

## 2013-01-08 NOTE — Addendum Note (Signed)
Addended by: Colleen Can on: 01/08/2013 09:12 AM   Modules accepted: Orders

## 2013-01-08 NOTE — Patient Instructions (Signed)
-  We placed a referral for you as discussed to the liver specialist (hepatologist) for your hepatitis. IT WILL BE VERY IMPORTANT THAT YOU SEE THIS DOCTOR.  It usually takes about 1-2 weeks to process and schedule this referral. If you have not heard from [name] regarding this appointment in 2 weeks please contact our office.  -We have ordered labs or and a pap smear at this visit. It can take up to 1-2 weeks for results and processing. We will contact you with instructions IF your results are abnormal. Normal results will be released to your [facility]. If you have not heard from [name] or can not find your results in [facility] in 2 weeks please contact our office.  -We recommend the following healthy lifestyle measures: - eat a healthy diet consisting of lots of vegetables, fruits, beans, nuts, seeds, healthy meats such as white chicken and fish and whole grains.  - avoid fried foods, fast food, processed foods, sodas, red meet and other fattening foods.  - get a least 150 minutes of aerobic exercise per week.   Please check to see when your next mammogram is due and have the results of your most recent mammogram forwarded to [name].  Use CERAVE CREAM on your skin daily, if not better in one month please call to set up appointment with dermatologist per information provided  Follow up in 3 months

## 2013-01-09 LAB — VITAMIN D 25 HYDROXY (VIT D DEFICIENCY, FRACTURES): Vit D, 25-Hydroxy: 43 ng/mL (ref 30–89)

## 2013-01-10 NOTE — Progress Notes (Signed)
Quick Note:  Called and spoke with pt and pt is aware. ______ 

## 2013-01-13 ENCOUNTER — Ambulatory Visit (INDEPENDENT_AMBULATORY_CARE_PROVIDER_SITE_OTHER): Payer: Medicare Other | Admitting: *Deleted

## 2013-01-13 ENCOUNTER — Encounter: Payer: Self-pay | Admitting: Internal Medicine

## 2013-01-13 DIAGNOSIS — I428 Other cardiomyopathies: Secondary | ICD-10-CM

## 2013-01-13 DIAGNOSIS — Z95810 Presence of automatic (implantable) cardiac defibrillator: Secondary | ICD-10-CM

## 2013-01-13 DIAGNOSIS — I509 Heart failure, unspecified: Secondary | ICD-10-CM

## 2013-01-20 ENCOUNTER — Encounter: Payer: Self-pay | Admitting: Internal Medicine

## 2013-01-21 LAB — REMOTE ICD DEVICE
AL IMPEDENCE ICD: 513 Ohm
ATRIAL PACING ICD: 0.26 pct
BATTERY VOLTAGE: 3.081 V
CHARGE TIME: 9.619 s
LV LEAD IMPEDENCE ICD: 418 Ohm
TOT-0001: 1
TOT-0002: 0
TOT-0006: 20120412000000
TZAT-0001ATACH: 1
TZAT-0001ATACH: 2
TZAT-0001ATACH: 3
TZAT-0001FASTVT: 1
TZAT-0001SLOWVT: 1
TZAT-0001SLOWVT: 2
TZAT-0002ATACH: NEGATIVE
TZAT-0002ATACH: NEGATIVE
TZAT-0002FASTVT: NEGATIVE
TZAT-0004SLOWVT: 8
TZAT-0005SLOWVT: 88 pct
TZAT-0005SLOWVT: 91 pct
TZAT-0012ATACH: 150 ms
TZAT-0012ATACH: 150 ms
TZAT-0012SLOWVT: 200 ms
TZAT-0012SLOWVT: 200 ms
TZAT-0013SLOWVT: 1
TZAT-0013SLOWVT: 2
TZAT-0018ATACH: NEGATIVE
TZAT-0018ATACH: NEGATIVE
TZAT-0018SLOWVT: NEGATIVE
TZAT-0018SLOWVT: NEGATIVE
TZAT-0019ATACH: 6 V
TZAT-0019ATACH: 6 V
TZAT-0020ATACH: 1.5 ms
TZON-0003ATACH: 350 ms
TZON-0003SLOWVT: 330 ms
TZON-0004SLOWVT: 32
TZON-0004VSLOWVT: 36
TZON-0005SLOWVT: 12
TZST-0001FASTVT: 2
TZST-0001FASTVT: 3
TZST-0001FASTVT: 4
TZST-0001SLOWVT: 3
TZST-0001SLOWVT: 4
TZST-0001SLOWVT: 6
TZST-0002ATACH: NEGATIVE
TZST-0002ATACH: NEGATIVE
TZST-0002FASTVT: NEGATIVE
TZST-0002FASTVT: NEGATIVE
TZST-0003SLOWVT: 35 J
TZST-0003SLOWVT: 35 J
VENTRICULAR PACING ICD: 97.69 pct

## 2013-02-10 ENCOUNTER — Encounter: Payer: Self-pay | Admitting: *Deleted

## 2013-02-11 ENCOUNTER — Other Ambulatory Visit: Payer: Self-pay | Admitting: Internal Medicine

## 2013-02-11 DIAGNOSIS — R1011 Right upper quadrant pain: Secondary | ICD-10-CM

## 2013-02-14 ENCOUNTER — Ambulatory Visit
Admission: RE | Admit: 2013-02-14 | Discharge: 2013-02-14 | Disposition: A | Discharge status: Home / Self Care | Payer: Commercial Managed Care - HMO | Source: Ambulatory Visit | Attending: Internal Medicine | Admitting: Internal Medicine

## 2013-02-14 DIAGNOSIS — R1011 Right upper quadrant pain: Secondary | ICD-10-CM

## 2013-02-17 ENCOUNTER — Encounter: Payer: Self-pay | Admitting: Internal Medicine

## 2013-02-17 NOTE — Patient Instructions (Signed)
Received office notes from Dr. Halford Chessman from [place].  Pt to return in 2 months for follow up care; HCV genotype, basic lab work, and abdominal ultrasound ordered.  Placed in Dr. Julianne Rice basket for signature.  Sent to scan afterward.

## 2013-04-09 ENCOUNTER — Encounter: Payer: Medicare Other | Admitting: Family Medicine

## 2013-04-09 NOTE — Progress Notes (Signed)
error    This encounter was created in error - please disregard.

## 2013-05-06 ENCOUNTER — Encounter: Payer: Self-pay | Admitting: Internal Medicine

## 2013-05-06 NOTE — Progress Notes (Signed)
Received office notes from hepatology Visit on 04/09/13.  Pt seen for follow up genotype 1 HCV; treatment naive.  Pt to submiy authorization for Harvoni treatment x 12 weeks, schedule labs 4 weeks after initiation of treatment, return to [facility] upon completion of treatment, and the patient to be vaccinated against hepatitis B at local [facility].

## 2013-05-15 ENCOUNTER — Encounter: Payer: Self-pay | Admitting: *Deleted

## 2013-05-21 ENCOUNTER — Other Ambulatory Visit: Payer: Self-pay

## 2013-05-21 DIAGNOSIS — I5022 Chronic systolic (congestive) heart failure: Secondary | ICD-10-CM

## 2013-05-21 DIAGNOSIS — Z95810 Presence of automatic (implantable) cardiac defibrillator: Secondary | ICD-10-CM

## 2013-05-21 DIAGNOSIS — I428 Other cardiomyopathies: Secondary | ICD-10-CM

## 2013-05-21 MED ORDER — CARVEDILOL 25 MG PO TABS: 25.0000 mg | ORAL_TABLET | Freq: Two times a day (BID) | ORAL | Status: DC

## 2013-05-21 MED ORDER — LISINOPRIL 20 MG PO TABS: 20.0000 mg | ORAL_TABLET | Freq: Two times a day (BID) | ORAL | Status: DC

## 2013-05-21 MED ORDER — FUROSEMIDE 40 MG PO TABS: 40.0000 mg | ORAL_TABLET | Freq: Every day | ORAL | Status: DC

## 2013-05-21 MED ORDER — SPIRONOLACTONE 25 MG PO TABS: 25.0000 mg | ORAL_TABLET | Freq: Every day | ORAL | Status: DC

## 2013-05-22 ENCOUNTER — Telehealth: Payer: Self-pay | Admitting: Family Medicine

## 2013-05-22 DIAGNOSIS — E039 Hypothyroidism, unspecified: Secondary | ICD-10-CM

## 2013-05-22 NOTE — Telephone Encounter (Signed)
[facility] requesting new scripts for the following:  traZODone (DESYREL) 50 MG tablet sertraline (ZOLOFT) 100 MG tablet levothyroxine (SYNTHROID, LEVOTHROID) 125 MCG tablet

## 2013-05-22 NOTE — Telephone Encounter (Signed)
Left a message for pt to return call to set up an appointment within 2 months and refills will be sent to pharmacy.

## 2013-05-22 NOTE — Telephone Encounter (Signed)
Pt last seen for cpx 01/2013; told to follow up in Dec 2014.  No upcoming appts.  Will you prescribe all these medications.

## 2013-05-22 NOTE — Telephone Encounter (Signed)
needs follow up in next 2 months - once scheduled can refill enough to get to appointment

## 2013-05-27 ENCOUNTER — Encounter: Payer: Self-pay | Admitting: Family Medicine

## 2013-05-27 ENCOUNTER — Ambulatory Visit (INDEPENDENT_AMBULATORY_CARE_PROVIDER_SITE_OTHER): Payer: Medicare HMO | Admitting: Family Medicine

## 2013-05-27 VITALS — BP 120/84 | Temp 97.8°F | Wt 170.0 lb

## 2013-05-27 DIAGNOSIS — F3289 Other specified depressive episodes: Secondary | ICD-10-CM

## 2013-05-27 DIAGNOSIS — I509 Heart failure, unspecified: Secondary | ICD-10-CM

## 2013-05-27 DIAGNOSIS — R21 Rash and other nonspecific skin eruption: Secondary | ICD-10-CM

## 2013-05-27 DIAGNOSIS — I428 Other cardiomyopathies: Secondary | ICD-10-CM

## 2013-05-27 DIAGNOSIS — E785 Hyperlipidemia, unspecified: Secondary | ICD-10-CM

## 2013-05-27 DIAGNOSIS — E119 Type 2 diabetes mellitus without complications: Secondary | ICD-10-CM

## 2013-05-27 DIAGNOSIS — E039 Hypothyroidism, unspecified: Secondary | ICD-10-CM

## 2013-05-27 DIAGNOSIS — J449 Chronic obstructive pulmonary disease, unspecified: Secondary | ICD-10-CM

## 2013-05-27 DIAGNOSIS — B1710 Acute hepatitis C without hepatic coma: Secondary | ICD-10-CM

## 2013-05-27 DIAGNOSIS — F329 Major depressive disorder, single episode, unspecified: Secondary | ICD-10-CM

## 2013-05-27 LAB — HEMOGLOBIN A1C: Hgb A1c MFr Bld: 6.4 % (ref 4.6–6.5)

## 2013-05-27 LAB — T4, FREE: Free T4: 1.01 ng/dL (ref 0.60–1.60)

## 2013-05-27 LAB — TSH: TSH: 0.33 u[IU]/mL — ABNORMAL LOW (ref 0.35–5.50)

## 2013-05-27 MED ORDER — TRAZODONE HCL 50 MG PO TABS: 50.0000 mg | ORAL_TABLET | Freq: Every evening | ORAL | Status: DC | PRN

## 2013-05-27 MED ORDER — LEVOTHYROXINE SODIUM 125 MCG PO TABS: 125.0000 ug | ORAL_TABLET | Freq: Every day | ORAL | Status: DC

## 2013-05-27 MED ORDER — SERTRALINE HCL 100 MG PO TABS: 100.0000 mg | ORAL_TABLET | Freq: Every day | ORAL | Status: DC

## 2013-05-27 NOTE — Addendum Note (Signed)
Addended by: Colleen Can on: 05/27/2013 11:35 AM   Modules accepted: Orders

## 2013-05-27 NOTE — Progress Notes (Signed)
Pre visit review using our clinic review tool, if applicable. No additional management support is needed unless otherwise documented below in the visit note. 

## 2013-05-27 NOTE — Patient Instructions (Signed)
-  We have ordered labs or studies at this visit. It can take up to 1-2 weeks for results and processing. We will contact you with instructions IF your results are abnormal. Normal results will be released to your [facility]. If you have not heard from [name] or can not find your results in [facility] in 2 weeks please contact our office.  -We placed a referral for you as discussed to the dermatologist. It usually takes about 1-2 weeks to process and schedule this referral. If you have not heard from [name] regarding this appointment in 2 weeks please contact our office.  We recommend the following healthy lifestyle measures: - eat a healthy diet consisting of lots of vegetables, fruits, beans, nuts, seeds, healthy meats such as white chicken and fish and whole grains.  - avoid fried foods, fast food, processed foods, sodas, red meet and other fattening foods.  - get a least 150 minutes of aerobic exercise per week.   Schedule appointment with eye doctor  Good foot care  If diabetes not improved or worsened would advise seeing diabetes educator and podiatrist  Follow up in: 3-4 months

## 2013-05-27 NOTE — Telephone Encounter (Signed)
Rx sent to pharmacy for 90 day.  Appt made for 05/27/13.

## 2013-05-27 NOTE — Progress Notes (Signed)
Chief Complaint  Patient presents with  . Follow-up    HPI:  Follow up:  Depression: -takes zoloft and trazadone -stable  Diabetes: -walks 3-4 times per week 45 minutes -diet is ok -mild -has not seen optho this year, but sees them every year   Hypothyroid: -on synthroid 125 mcg Lab Results  Component Value Date   TSH 6.49* 01/08/2013  -free t4 was normal -takes synthroid daily on empty stomach  COPD: -uses alb prn -stable  Hep C: -followed by hepatologist -she is working on approval for medicine for treatment with Harvoni -had labs and [name]  Cardiomyopathy, CHF, HLD, HTN: -followed by cardiologist -symptoms unchanged  HM: -needs pneumonia vaccine per chart  SKIN RASH: -started about 8 months ago -itchy large bumps on back , abdomen, buttocks, legs -new lesions appear frequently, leaves scares -has tried cerave and shea butter on these -wants to see dermatologist  ROS: See pertinent positives and negatives per HPI.  Past Medical History  Diagnosis Date  . LBBB (left bundle branch block)   . COPD (chronic obstructive pulmonary disease)   . Nonischemic cardiomyopathy     Followed by [facility]  . Biventricular implantable cardiac defibrillator in situ 12/24/2006    Protecta- Medtronic  ICD gen change 2012  . Chronic systolic congestive heart failure   . Hepatitis C   . CHF (congestive heart failure)   . Hypothyroidism   . Personal history of other infectious and parasitic disease     Hepatitis B  . CONSTIPATION 02/28/2008    Qualifier: Diagnosis of  By: Hassell Done FNP, Tori Milks    . FIBROIDS, UTERUS 03/05/2008    Qualifier: Diagnosis of  By: Isla Pence    . OBSTRUCTIVE SLEEP APNEA 11/14/2007    npsg 2009:  Mild osa with AHI 11/hr. Started cpap 2009, quit using due to lack of response Home sleep testing 09/2010 with AHI only 7/hr and weight loss advised by pulmonology.  . OSTEOPENIA 09/30/2008    Qualifier: Diagnosis of  By: Hassell Done FNP,  Tori Milks    . OBESITY 05/27/2009    Qualifier: Diagnosis of  By: Hassell Done FNP, Tori Milks    . DEPRESSION 12/17/2009    Annotation: PHQ-9 score = 14 done on 12/17/2009 Qualifier: Diagnosis of  By: Hassell Done FNP, Tori Milks    . Hyperlipemia 12/06/2012  . HYPERTENSION, BENIGN 04/24/2007    Qualifier: Diagnosis of  By: Hassell Done FNP, Tori Milks    . Asthma   . Arthritis   . Glaucoma     Past Surgical History  Procedure Laterality Date  . Appendectomy    . Cardiac catheterization    . [id number]      Concerto 0000000  . Insert / replace / remove pacemaker      concerto    Family History  Problem Relation Age of Onset  . Asthma Father   . Asthma Sister   . Cancer Sister     lung    History   Social History  . Marital Status: Single    Spouse Name: N/A    Number of Children: N/A  . Years of Education: N/A   Social History Main Topics  . Smoking status: Never Smoker   . Smokeless tobacco: Never Used  . Alcohol Use: Yes     Comment: 3 glasses of wine 3-4 times per week  . Drug Use: None  . Sexual Activity: None   Other Topics Concern  . 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  , Disp: , Rfl: ;  atorvastatin (LIPITOR) 40 MG tablet, Take 1 tablet (40 mg total) by mouth daily., Disp: 90 tablet, Rfl: 3;  carvedilol (COREG) 25 MG tablet, Take 1 tablet (25 mg total) by mouth 2 (two) times daily with a meal., Disp: 180 tablet, Rfl: 3;  furosemide (LASIX) 40 MG tablet, Take 1 tablet (40 mg total) by mouth daily., Disp: 90 tablet, Rfl: 3 levothyroxine (SYNTHROID, LEVOTHROID) 125 MCG tablet, Take 1 tablet (125 mcg total) by mouth daily before breakfast., Disp: 90 tablet, Rfl: 0;  lisinopril (PRINIVIL,ZESTRIL) 20 MG tablet, Take 1 tablet (20 mg  total) by mouth 2 (two) times daily., Disp: 180 tablet, Rfl: 3;  nitroGLYCERIN (NITROSTAT) 0.4 MG SL tablet, Place 1 tablet (0.4 mg total) under the tongue every 5 (five) minutes as needed., Disp: 25 tablet, Rfl: 6 sertraline (ZOLOFT) 100 MG tablet, Take 1 tablet (100 mg total) by mouth daily., Disp: 90 tablet, Rfl: 0;  spironolactone (ALDACTONE) 25 MG tablet, Take 1 tablet (25 mg total) by mouth daily., Disp: 90 tablet, Rfl: 3;  traZODone (DESYREL) 50 MG tablet, Take 1 tablet (50 mg total) by mouth at bedtime as needed., Disp: 90 tablet, Rfl: 0;  sertraline (ZOLOFT) 100 MG tablet, TAKE ONE TABLET BY MOUTH EVERY DAY AT BEDTIME, Disp: 30 tablet, Rfl: 11  EXAM:  Filed Vitals:   05/27/13 1304  BP: 120/84  Temp: 97.8 F (36.6 C)    Body mass index is 33.77 kg/(m^2).  GENERAL: vitals reviewed and listed above, alert, oriented, appears well hydrated and in no acute distress  HEENT: atraumatic, conjunttiva clear, no obvious abnormalities on inspection of external nose and ears  NECK: no obvious masses on inspection  LUNGS: clear to auscultation bilaterally, no wheezes, rales or rhonchi, good air movement  CV: HRRR, no peripheral edema  SKIN: multiple hyperpigmented papules and macules throughout in various stages and sizes  MS: moves all extremities without noticeable abnormality  PSYCH: pleasant and cooperative, no obvious depression or anxiety  Foot exam: see foot exam  ASSESSMENT AND PLAN:  Discussed the following assessment and plan:  CONGESTIVE HEART FAILURE  Nonischemic cardiomyopathy  Rash and nonspecific skin eruption - Plan: Ambulatory referral to Dermatology  Diabetes mellitus - Plan: Hemoglobin A1c  DEPRESSION  HYPOTHYROIDISM NOS - Plan: TSH, T4, Free  COPD  Hyperlipemia  HEPATITIS C  -checking thyroid and diabetes labs today - other labs done  -refilled medications -diabetic foot exam don and advised podiatry and good foot care - she wants to hold on this  until hgba1c -advised to schedule eye exam -advised referral to diabetes educator- again she wants to see what hgba1c is first -lifestyle recs -she is to follow up with cardiologist and hepatologist -Patient advised to return or notify a doctor immediately if symptoms worsen or persist or new concerns arise.  Patient Instructions  -We have ordered labs or studies at this visit. It can take up to 1-2 weeks for results and processing. We will contact you with instructions IF your results are abnormal. Normal results will be released to your [facility]. If you have not heard from [name] or can not find your results  in [facility] in 2 weeks please contact our office.  -We placed a referral for you as discussed to the dermatologist. It usually takes about 1-2 weeks to process and schedule this referral. If you have not heard from [name] regarding this appointment in 2 weeks please contact our office.  We recommend the following healthy lifestyle measures: - eat a healthy diet consisting of lots of vegetables, fruits, beans, nuts, seeds, healthy meats such as white chicken and fish and whole grains.  - avoid fried foods, fast food, processed foods, sodas, red meet and other fattening foods.  - get a least 150 minutes of aerobic exercise per week.   Schedule appointment with eye doctor  Good foot care  If diabetes not improved or worsened would advise seeing diabetes educator and podiatrist  Follow up in: 3-4 months      Sophia Sperry R.

## 2013-05-28 ENCOUNTER — Telehealth: Payer: Self-pay | Admitting: Family Medicine

## 2013-05-28 NOTE — Telephone Encounter (Signed)
Relevant patient education mailed to patient.  

## 2013-06-09 ENCOUNTER — Encounter: Payer: Self-pay | Admitting: Internal Medicine

## 2013-06-09 ENCOUNTER — Ambulatory Visit (INDEPENDENT_AMBULATORY_CARE_PROVIDER_SITE_OTHER): Payer: Commercial Managed Care - HMO | Admitting: Internal Medicine

## 2013-06-09 VITALS — BP 148/78 | HR 64 | Ht 59.0 in | Wt 172.0 lb

## 2013-06-09 DIAGNOSIS — Z95810 Presence of automatic (implantable) cardiac defibrillator: Secondary | ICD-10-CM

## 2013-06-09 DIAGNOSIS — I509 Heart failure, unspecified: Secondary | ICD-10-CM

## 2013-06-09 DIAGNOSIS — I428 Other cardiomyopathies: Secondary | ICD-10-CM

## 2013-06-09 LAB — MDC_IDC_ENUM_SESS_TYPE_INCLINIC
Battery Voltage: 3.05 V
Brady Statistic AP VP Percent: 0.21 %
Brady Statistic RA Percent Paced: 0.23 %
Brady Statistic RV Percent Paced: 98.71 %
Date Time Interrogation Session: 20150202152219
HIGH POWER IMPEDANCE MEASURED VALUE: 361 Ohm
HIGH POWER IMPEDANCE MEASURED VALUE: 67 Ohm
HighPow Impedance: 190 Ohm
HighPow Impedance: 51 Ohm
Lead Channel Impedance Value: 4047 Ohm
Lead Channel Impedance Value: 456 Ohm
Lead Channel Impedance Value: 456 Ohm
Lead Channel Impedance Value: 475 Ohm
Lead Channel Pacing Threshold Amplitude: 0.75 V
Lead Channel Pacing Threshold Amplitude: 0.75 V
Lead Channel Pacing Threshold Pulse Width: 0.4 ms
Lead Channel Pacing Threshold Pulse Width: 0.4 ms
Lead Channel Sensing Intrinsic Amplitude: 3.125 mV
Lead Channel Sensing Intrinsic Amplitude: 3.375 mV
Lead Channel Setting Pacing Pulse Width: 0.4 ms
Lead Channel Setting Sensing Sensitivity: 0.3 mV
MDC IDC MSMT LEADCHNL LV IMPEDANCE VALUE: 4047 Ohm
MDC IDC MSMT LEADCHNL LV PACING THRESHOLD AMPLITUDE: 0.875 V
MDC IDC MSMT LEADCHNL LV PACING THRESHOLD PULSEWIDTH: 0.4 ms
MDC IDC MSMT LEADCHNL RV SENSING INTR AMPL: 16 mV
MDC IDC MSMT LEADCHNL RV SENSING INTR AMPL: 19.625 mV
MDC IDC SET LEADCHNL LV PACING AMPLITUDE: 2 V
MDC IDC SET LEADCHNL RA PACING AMPLITUDE: 2 V
MDC IDC SET LEADCHNL RV PACING AMPLITUDE: 2.5 V
MDC IDC SET LEADCHNL RV PACING PULSEWIDTH: 0.4 ms
MDC IDC SET ZONE DETECTION INTERVAL: 330 ms
MDC IDC SET ZONE DETECTION INTERVAL: 350 ms
MDC IDC SET ZONE DETECTION INTERVAL: 400 ms
MDC IDC STAT BRADY AP VS PERCENT: 0.01 %
MDC IDC STAT BRADY AS VP PERCENT: 98.5 %
MDC IDC STAT BRADY AS VS PERCENT: 1.28 %
Zone Setting Detection Interval: 280 ms

## 2013-06-09 LAB — BASIC METABOLIC PANEL
BUN: 27 mg/dL — AB (ref 6–23)
CO2: 24 mEq/L (ref 19–32)
CREATININE: 1 mg/dL (ref 0.4–1.2)
Calcium: 9.5 mg/dL (ref 8.4–10.5)
Chloride: 108 mEq/L (ref 96–112)
GFR: 71.48 mL/min (ref >60.00)
GLUCOSE: 104 mg/dL — AB (ref 70–99)
Potassium: 4.1 mEq/L (ref 3.5–5.1)
Sodium: 138 mEq/L (ref 135–145)

## 2013-06-09 MED ORDER — ISOSORB DINITRATE-HYDRALAZINE 20-37.5 MG PO TABS: 1.0000 | ORAL_TABLET | Freq: Two times a day (BID) | ORAL | Status: DC

## 2013-06-09 NOTE — Assessment & Plan Note (Signed)
She still has class 2-3 symptoms. We'll check an AV optimization echo. We'll begin her on by echo. We'll check a metabolic profile on Aldactone. She is euvolemic we'll continue current doses of diuretics

## 2013-06-09 NOTE — Assessment & Plan Note (Signed)
D.dfn The patient's device was interrogated.  The information was reviewed. No changes were made in the programming.

## 2013-06-09 NOTE — Patient Instructions (Addendum)
Your physician recommends that you have lab work today: BMET  Your physician has recommended you make the following change in your medication:  1) Start Bidil one tablet twice daily  Your physician has recommended that you have an AV optimization echo. During this procedure, an echocardiogram is performed to optimize the timing of your device using ultrasound and a device programmer. Changes will be made to the device settings to help the heart chambers pump more efficiently. This procedure takes approximately one hour.  Remote monitoring is used to monitor your Pacemaker of ICD from home. This monitoring reduces the number of office visits required to check your device to one time per year. It allows [name] to keep an eye on the functioning of your device to ensure it is working properly. You are scheduled for a device check from home on 09/09/13. You may send your transmission at any time that day. If you have a wireless device, the transmission will be sent automatically. After your physician reviews your transmission, you will receive a postcard with your next transmission date.  Your physician wants you to follow-up in: 6 months with Dr. Caryl Comes. You will receive a reminder letter in the mail two months in advance. If you don't receive a letter, please call our office to schedule the follow-up appointment.

## 2013-06-09 NOTE — Assessment & Plan Note (Signed)
.  dgdmt Guideline directed medical therapy

## 2013-06-09 NOTE — Progress Notes (Signed)
Patient Care Team: [name], DO as PCP - General (Family Medicine)   HPI  Morgan Clay is a 68 y.o. female Seen in followup for CRT-D implanted for nonischemic cardiomyopathy. This is undertaken 2008 with generator change 2012.  Remains much improved from prior implantation was and dyspnea on exertion there has been no edema no chest pain Past Medical History  Diagnosis Date  . LBBB (left bundle branch block)   . COPD (chronic obstructive pulmonary disease)   . Nonischemic cardiomyopathy     Followed by [facility]  . Biventricular implantable cardiac defibrillator in situ 12/24/2006    Protecta- Medtronic  ICD gen change 2012  . Chronic systolic congestive heart failure   . Hepatitis C   . CHF (congestive heart failure)   . Hypothyroidism   . Personal history of other infectious and parasitic disease     Hepatitis B  . CONSTIPATION 02/28/2008    Qualifier: Diagnosis of  By: Hassell Done FNP, Tori Milks    . FIBROIDS, UTERUS 03/05/2008    Qualifier: Diagnosis of  By: Isla Pence    . OBSTRUCTIVE SLEEP APNEA 11/14/2007    npsg 2009:  Mild osa with AHI 11/hr. Started cpap 2009, quit using due to lack of response Home sleep testing 09/2010 with AHI only 7/hr and weight loss advised by pulmonology.  . OSTEOPENIA 09/30/2008    Qualifier: Diagnosis of  By: Hassell Done FNP, Tori Milks    . OBESITY 05/27/2009    Qualifier: Diagnosis of  By: Hassell Done FNP, Tori Milks    . DEPRESSION 12/17/2009    Annotation: PHQ-9 score = 14 done on 12/17/2009 Qualifier: Diagnosis of  By: Hassell Done FNP, Tori Milks    . Hyperlipemia 12/06/2012  . HYPERTENSION, BENIGN 04/24/2007    Qualifier: Diagnosis of  By: Hassell Done FNP, Tori Milks    . Asthma   . Arthritis   . Glaucoma     Past Surgical History  Procedure Laterality Date  . Appendectomy    . Cardiac catheterization    . [id number]      Concerto 0000000  . Insert / replace / remove pacemaker      concerto    Current Outpatient Prescriptions    Medication Sig Dispense Refill  . aspirin 81 MG tablet Take 81 mg by mouth daily.        [name] atorvastatin (LIPITOR) 40 MG tablet Take 1 tablet (40 mg total) by mouth daily.  90 tablet  3  . carvedilol (COREG) 25 MG tablet Take 1 tablet (25 mg total) by mouth 2 (two) times daily with a meal.  180 tablet  3  . furosemide (LASIX) 40 MG tablet Take 1 tablet (40 mg total) by mouth daily.  90 tablet  3  . levothyroxine (SYNTHROID, LEVOTHROID) 125 MCG tablet Take 1 tablet (125 mcg total) by mouth daily before breakfast.  90 tablet  0  . lisinopril (PRINIVIL,ZESTRIL) 20 MG tablet Take 1 tablet (20 mg total) by mouth 2 (two) times daily.  180 tablet  3  . nitroGLYCERIN (NITROSTAT) 0.4 MG SL tablet Place 1 tablet (0.4 mg total) under the tongue every 5 (five) minutes as needed.  25 tablet  6  . sertraline (ZOLOFT) 100 MG tablet Take 1 tablet (100 mg total) by mouth daily.  90 tablet  0  . spironolactone (ALDACTONE) 25 MG tablet Take 1 tablet (25 mg total) by mouth daily.  90 tablet  3  . traZODone (DESYREL) 50 MG tablet Take 1 tablet (50  mg total) by mouth at bedtime as needed.  90 tablet  0   No current facility-administered medications for this visit.    Allergies  Allergen Reactions  . Oxycodone-Acetaminophen     Percocet    Review of Systems negative except from HPI and PMH  Physical Exam BP 148/78  Pulse 64  Ht 4\' 11"  (1.499 m)  Wt 172 lb (78.019 kg)  BMI 34.72 kg/m2 Well developed and well nourished in no acute distress HENT normal E scleral and icterus clear Neck Supple JVP flat; carotids brisk and full Device pocket well healed; without hematoma or erythema.  There is no tethering Clear to ausculation regular rate and rhythm, no murmurs gallops or rub Soft with active bowel sounds No clubbing cyanosis   Edema Alert and oriented, grossly normal motor and sensory function Skin Warm and Dry  ECG P. Synchronous Bi pacing Assessment and  Plan

## 2013-07-01 ENCOUNTER — Other Ambulatory Visit (HOSPITAL_COMMUNITY): Payer: Medicare HMO

## 2013-07-11 NOTE — Progress Notes (Signed)
Received a fax from [facility] that pt did not keep appt scheduled with office on 3.5.15 and has not rescheduled at this time.

## 2013-07-17 ENCOUNTER — Other Ambulatory Visit (HOSPITAL_COMMUNITY): Payer: Medicare HMO

## 2013-08-11 ENCOUNTER — Encounter: Payer: Self-pay | Admitting: Internal Medicine

## 2013-08-11 ENCOUNTER — Ambulatory Visit (INDEPENDENT_AMBULATORY_CARE_PROVIDER_SITE_OTHER): Payer: Medicare HMO | Admitting: *Deleted

## 2013-08-11 ENCOUNTER — Ambulatory Visit (HOSPITAL_COMMUNITY): Payer: Medicare HMO | Attending: Internal Medicine | Admitting: Radiology

## 2013-08-11 DIAGNOSIS — I5022 Chronic systolic (congestive) heart failure: Secondary | ICD-10-CM | POA: Diagnosis not present

## 2013-08-11 DIAGNOSIS — I509 Heart failure, unspecified: Secondary | ICD-10-CM

## 2013-08-11 DIAGNOSIS — I428 Other cardiomyopathies: Secondary | ICD-10-CM | POA: Insufficient documentation

## 2013-08-11 LAB — MDC_IDC_ENUM_SESS_TYPE_INCLINIC
Battery Voltage: 3.04 V
Brady Statistic RA Percent Paced: 0.13 %
Date Time Interrogation Session: 20150406194026
HIGH POWER IMPEDANCE MEASURED VALUE: 190 Ohm
HIGH POWER IMPEDANCE MEASURED VALUE: 49 Ohm
HIGH POWER IMPEDANCE MEASURED VALUE: 74 Ohm
HighPow Impedance: 399 Ohm
Lead Channel Impedance Value: 456 Ohm
Lead Channel Impedance Value: 513 Ohm
Lead Channel Pacing Threshold Amplitude: 0.75 V
Lead Channel Pacing Threshold Amplitude: 0.875 V
Lead Channel Pacing Threshold Amplitude: 0.875 V
Lead Channel Pacing Threshold Pulse Width: 0.4 ms
Lead Channel Pacing Threshold Pulse Width: 0.4 ms
Lead Channel Pacing Threshold Pulse Width: 0.4 ms
Lead Channel Sensing Intrinsic Amplitude: 18.125 mV
Lead Channel Sensing Intrinsic Amplitude: 18.125 mV
Lead Channel Setting Pacing Amplitude: 2 V
Lead Channel Setting Pacing Amplitude: 2.5 V
Lead Channel Setting Pacing Pulse Width: 0.4 ms
Lead Channel Setting Pacing Pulse Width: 0.4 ms
MDC IDC MSMT LEADCHNL LV IMPEDANCE VALUE: 4047 Ohm
MDC IDC MSMT LEADCHNL LV IMPEDANCE VALUE: 4047 Ohm
MDC IDC MSMT LEADCHNL RA IMPEDANCE VALUE: 475 Ohm
MDC IDC MSMT LEADCHNL RA SENSING INTR AMPL: 2.75 mV
MDC IDC MSMT LEADCHNL RA SENSING INTR AMPL: 2.75 mV
MDC IDC SET LEADCHNL RA PACING AMPLITUDE: 2 V
MDC IDC SET LEADCHNL RV SENSING SENSITIVITY: 0.3 mV
MDC IDC SET ZONE DETECTION INTERVAL: 280 ms
MDC IDC STAT BRADY AP VP PERCENT: 0.12 %
MDC IDC STAT BRADY AP VS PERCENT: 0.01 %
MDC IDC STAT BRADY AS VP PERCENT: 99.06 %
MDC IDC STAT BRADY AS VS PERCENT: 0.81 %
MDC IDC STAT BRADY RV PERCENT PACED: 99.18 %
Zone Setting Detection Interval: 330 ms
Zone Setting Detection Interval: 350 ms
Zone Setting Detection Interval: 400 ms

## 2013-08-11 NOTE — Progress Notes (Signed)
Echocardiogram performed with AV optimization.  AV optimization performed by Dr. Caryl Comes.

## 2013-08-18 NOTE — Progress Notes (Signed)
AV Opt performed. Intrinsic PR interval measured at 132ms. Sensed AV delay decremented by 29ms from 312ms-80ms. Paced and Sensed AVDs permanently programmed to 150/164ms. OK per SK. Patient to follow up as scheduled.

## 2013-08-25 ENCOUNTER — Ambulatory Visit (INDEPENDENT_AMBULATORY_CARE_PROVIDER_SITE_OTHER): Payer: Medicare HMO | Admitting: Family Medicine

## 2013-08-25 ENCOUNTER — Encounter: Payer: Self-pay | Admitting: Family Medicine

## 2013-08-25 VITALS — BP 132/88 | Temp 98.0°F | Wt 174.7 lb

## 2013-08-25 DIAGNOSIS — I10 Essential (primary) hypertension: Secondary | ICD-10-CM

## 2013-08-25 DIAGNOSIS — J4489 Other specified chronic obstructive pulmonary disease: Secondary | ICD-10-CM

## 2013-08-25 DIAGNOSIS — Z23 Encounter for immunization: Secondary | ICD-10-CM

## 2013-08-25 DIAGNOSIS — E785 Hyperlipidemia, unspecified: Secondary | ICD-10-CM

## 2013-08-25 DIAGNOSIS — E039 Hypothyroidism, unspecified: Secondary | ICD-10-CM

## 2013-08-25 DIAGNOSIS — E669 Obesity, unspecified: Secondary | ICD-10-CM

## 2013-08-25 DIAGNOSIS — E119 Type 2 diabetes mellitus without complications: Secondary | ICD-10-CM

## 2013-08-25 DIAGNOSIS — J449 Chronic obstructive pulmonary disease, unspecified: Secondary | ICD-10-CM

## 2013-08-25 DIAGNOSIS — I5022 Chronic systolic (congestive) heart failure: Secondary | ICD-10-CM

## 2013-08-25 LAB — BASIC METABOLIC PANEL
BUN: 23 mg/dL (ref 6–23)
CHLORIDE: 105 meq/L (ref 96–112)
CO2: 26 meq/L (ref 19–32)
CREATININE: 1.3 mg/dL — AB (ref 0.4–1.2)
Calcium: 9.6 mg/dL (ref 8.4–10.5)
GFR: 52.77 mL/min — ABNORMAL LOW (ref >60.00)
GLUCOSE: 108 mg/dL — AB (ref 70–99)
Potassium: 4.1 mEq/L (ref 3.5–5.1)
Sodium: 141 mEq/L (ref 135–145)

## 2013-08-25 LAB — MICROALBUMIN / CREATININE URINE RATIO
CREATININE, U: 20.1 mg/dL
Microalb Creat Ratio: 11 mg/g (ref 0.0–30.0)
Microalb, Ur: 2.2 mg/dL — ABNORMAL HIGH (ref 0.0–1.9)

## 2013-08-25 LAB — T4, FREE: Free T4: 0.79 ng/dL (ref 0.60–1.60)

## 2013-08-25 LAB — TSH: TSH: 5.6 u[IU]/mL — ABNORMAL HIGH (ref 0.35–5.50)

## 2013-08-25 NOTE — Patient Instructions (Signed)
-  We have ordered labs or studies at this visit. It can take up to 1-2 weeks for results and processing. We will contact you with instructions IF your results are abnormal. Normal results will be released to your [facility]. If you have not heard from [name] or can not find your results in [facility] in 2 weeks please contact our office.  -follow up in 3-4 months

## 2013-08-25 NOTE — Addendum Note (Signed)
Addended by: Colleen Can on: 08/25/2013 01:23 PM   Modules accepted: Orders

## 2013-08-25 NOTE — Progress Notes (Signed)
Pre visit review using our clinic review tool, if applicable. No additional management support is needed unless otherwise documented below in the visit note. 

## 2013-08-25 NOTE — Progress Notes (Signed)
Chief Complaint  Patient presents with  . Follow-up    HPI:  Follow up:  Depression: -takes zoloft and trazadone -stable  Diabetes: -walks 3-4 times per week 45 minutes -diet is ok -mild -has not seen optho this year, but sees them every year Lab Results  Component Value Date   HGBA1C 6.4 05/27/2013   Hypothyroid: -on synthroid 125 mcg Lab Results  Component Value Date   TSH 0.33* 05/27/2013  -free t4 was normal -takes synthroid daily on empty stomach  COPD: -uses alb prn -stable  Hep C: -followed by hepatologist -she is working on approval for medicine for treatment with Harvoni -had labs and [name]  Cardiomyopathy, CHF, HLD, HTN: -followed by cardiologist - started bidil and did AV optimization echo -symptoms unchanged -most recent notes reviewed, appreciate care  HM: -needs pneumonia vaccine per chart  Left lat upper leg pain: -started when doing curves recently -no weakness or numbness  ROS: See pertinent positives and negatives per HPI.  Past Medical History  Diagnosis Date  . LBBB (left bundle branch block)   . COPD (chronic obstructive pulmonary disease)   . Nonischemic cardiomyopathy     Followed by [facility]  . Biventricular implantable cardiac defibrillator in situ 12/24/2006    Protecta- Medtr[employer]  ICD gen change 2012  . Chronic systolic congestive heart failure   . Hepatitis C   . CHF (congestive heart failure)   . Hypothyroidism   . Personal history of other infectious and parasitic disease     Hepatitis B  . CONSTIPATION 02/28/2008    Qualifier: Diagnosis of  By: Hassell Done FNP, Tori Milks    . FIBROIDS, UTERUS 03/05/2008    Qualifier: Diagnosis of  By: Isla Pence    . OBSTRUCTIVE SLEEP APNEA 11/14/2007    npsg 2009:  Mild osa with AHI 11/hr. Started cpap 2009, quit using due to lack of response Home sleep testing 09/2010 with AHI only 7/hr and weight loss advised by pulmonology.  . OSTEOPENIA 09/30/2008    Qualifier: Diagnosis of   By: Hassell Done FNP, Tori Milks    . OBESITY 05/27/2009    Qualifier: Diagnosis of  By: Hassell Done FNP, Tori Milks    . DEPRESSION 12/17/2009    Annotation: PHQ-9 score = 14 done on 12/17/2009 Qualifier: Diagnosis of  By: Hassell Done FNP, Tori Milks    . Hyperlipemia 12/06/2012  . HYPERTENSION, BENIGN 04/24/2007    Qualifier: Diagnosis of  By: Hassell Done FNP, Tori Milks    . Asthma   . Arthritis   . Glaucoma     Past Surgical History  Procedure Laterality Date  . Appendectomy    . Cardiac catheterization    . [id number]      Concerto 0000000  . Insert / replace / remove pacemaker      concerto    Family History  Problem Relation Age of Onset  . Asthma Father   . Asthma Sister   . Cancer Sister     lung    History   Social History  . Marital Status: Single    Spouse Name: N/A    Number of Children: N/A  . Years of Education: N/A   Social History Main Topics  . Smoking status: Never Smoker   . Smokeless tobacco: Never Used  . Alcohol Use: Yes     Comment: 3 glasses of wine 3-4 times per week  . Drug Use: None  . Sexual Activity: None   Other Topics Concern  . 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  , Disp: , Rfl: ;  atorvastatin (LIPITOR) 40 MG tablet, Take 1 tablet (40 mg total) by mouth daily., Disp: 90 tablet, Rfl: 3;  carvedilol (COREG) 25 MG tablet, Take 1 tablet (25 mg total) by mouth 2 (two) times daily with a meal., Disp: 180 tablet, Rfl: 3;  furosemide (LASIX) 40 MG tablet, Take 1 tablet (40 mg total) by mouth daily., Disp: 90 tablet, Rfl: 3 isosorbide-hydrALAZINE (BIDIL) 20-37.5 MG per tablet, Take 1 tablet by mouth 2 (two) times daily., Disp: 60 tablet, Rfl: 6;  levothyroxine (SYNTHROID, LEVOTHROID) 125 MCG tablet, Take 1  tablet (125 mcg total) by mouth daily before breakfast., Disp: 90 tablet, Rfl: 0;  lisinopril (PRINIVIL,ZESTRIL) 20 MG tablet, Take 1 tablet (20 mg total) by mouth 2 (two) times daily., Disp: 180 tablet, Rfl: 3 nitroGLYCERIN (NITROSTAT) 0.4 MG SL tablet, Place 1 tablet (0.4 mg total) under the tongue every 5 (five) minutes as needed., Disp: 25 tablet, Rfl: 6;  sertraline (ZOLOFT) 100 MG tablet, Take 1 tablet (100 mg total) by mouth daily., Disp: 90 tablet, Rfl: 0;  spironolactone (ALDACTONE) 25 MG tablet, Take 1 tablet (25 mg total) by mouth daily., Disp: 90 tablet, Rfl: 3 traZODone (DESYREL) 50 MG tablet, Take 1 tablet (50 mg total) by mouth at bedtime as needed., Disp: 90 tablet, Rfl: 0;  HARVONI 90-400 MG TABS, , Disp: , Rfl: ;  meloxicam (MOBIC) 15 MG tablet, , Disp: , Rfl:   EXAM:  Filed Vitals:   08/25/13 1249  BP: 132/88  Temp: 98 F (36.7 C)    Body mass index is 35.27 kg/(m^2).  GENERAL: vitals reviewed and listed above, alert, oriented, appears well hydrated and in no acute distress  HEENT: atraumatic, conjunttiva clear, no obvious abnormalities on inspection of external nose and ears  NECK: no obvious masses on inspection  LUNGS: clear to auscultation bilaterally, no wheezes, rales or rhonchi, good air movement  CV: HRRR, no peripheral edema  SKIN: multiple hyperpigmented papules and macules throughout in various stages and sizes  MS: moves all extremities without noticeable abnormality  PSYCH: pleasant and cooperative, no obvious depression or anxiety  Foot exam: see foot exam  ASSESSMENT AND PLAN:  Discussed the following assessment and plan:  OBESITY  HYPERTENSION, BENIGN - Plan: Basic metabolic panel  COPD  Chronic systolic heart failure  Hyperlipemia  HYPOTHYROIDISM NOS - Plan: TSH, T4, Free  Diabetes mellitus - Plan: Microalbumin/Creatinine Ratio, Urine  -checking thyroid and diabetes labs today - other labs done  -refilled medications -diabetic  foot exam don and advised podiatry and good foot care - she wants to hold on this until hgba1c -advised to schedule eye exam -advised referral to diabetes educator- again she wants to see what hgba1c is first -lifestyle recs -pneumonia vaccine today -she is to follow up with cardiologist and hepatologist -Patient advised to return or notify a doctor immediately if symptoms worsen or persist or new concerns arise.  Patient Instructions  -We have ordered labs or studies at this visit. It can take up to 1-2 weeks for results and processing. We will contact you with instructions IF your results are abnormal. Normal  results will be released to your [facility]. If you have not heard from [name] or can not find your results in [facility] in 2 weeks please contact our office.  -follow up in 3-4 months         [name]

## 2013-08-26 ENCOUNTER — Telehealth: Payer: Self-pay | Admitting: Family Medicine

## 2013-08-26 NOTE — Telephone Encounter (Signed)
Relevant patient education mailed to patient.  

## 2013-08-26 NOTE — Telephone Encounter (Signed)
Pt had pneumonia injection yesterday and now has fever. This call was transferred to CAN and pt disconnected before triage nurse could pick up. I attempt to call pt on home and cell both lines was busy

## 2013-08-27 NOTE — Telephone Encounter (Signed)
Attempted to call pt line busy

## 2013-08-28 ENCOUNTER — Other Ambulatory Visit: Payer: Self-pay

## 2013-08-28 DIAGNOSIS — I428 Other cardiomyopathies: Secondary | ICD-10-CM

## 2013-08-28 NOTE — Telephone Encounter (Signed)
Pt states she is feeling much better, pt advised that if her sx come back or worsens to give the office a call

## 2013-09-03 ENCOUNTER — Telehealth: Payer: Self-pay

## 2013-09-03 NOTE — Telephone Encounter (Signed)
Relevant patient education mailed to patient.  

## 2013-09-04 ENCOUNTER — Other Ambulatory Visit (INDEPENDENT_AMBULATORY_CARE_PROVIDER_SITE_OTHER): Payer: Commercial Managed Care - HMO

## 2013-09-04 DIAGNOSIS — I428 Other cardiomyopathies: Secondary | ICD-10-CM

## 2013-09-04 DIAGNOSIS — N289 Disorder of kidney and ureter, unspecified: Secondary | ICD-10-CM

## 2013-09-04 LAB — BASIC METABOLIC PANEL
BUN: 23 mg/dL (ref 6–23)
CHLORIDE: 102 meq/L (ref 96–112)
CO2: 26 mEq/L (ref 19–32)
Calcium: 9.8 mg/dL (ref 8.4–10.5)
Creatinine, Ser: 1.5 mg/dL — ABNORMAL HIGH (ref 0.4–1.2)
GFR: 45.43 mL/min — ABNORMAL LOW (ref >60.00)
Glucose, Bld: 102 mg/dL — ABNORMAL HIGH (ref 70–99)
POTASSIUM: 3.4 meq/L — AB (ref 3.5–5.1)
Sodium: 138 mEq/L (ref 135–145)

## 2013-09-09 ENCOUNTER — Encounter: Payer: Medicare HMO | Admitting: *Deleted

## 2013-09-12 ENCOUNTER — Encounter: Payer: Self-pay | Admitting: Internal Medicine

## 2013-09-30 ENCOUNTER — Other Ambulatory Visit: Payer: Self-pay | Admitting: Nephrology

## 2013-09-30 DIAGNOSIS — N179 Acute kidney failure, unspecified: Secondary | ICD-10-CM

## 2013-10-01 ENCOUNTER — Ambulatory Visit
Admission: RE | Admit: 2013-10-01 | Discharge: 2013-10-01 | Disposition: A | Discharge status: Home / Self Care | Payer: Commercial Managed Care - HMO | Source: Ambulatory Visit | Attending: Nephrology | Admitting: Nephrology

## 2013-10-01 DIAGNOSIS — N179 Acute kidney failure, unspecified: Secondary | ICD-10-CM

## 2013-10-02 ENCOUNTER — Other Ambulatory Visit: Payer: Self-pay | Admitting: Family Medicine

## 2013-10-03 ENCOUNTER — Encounter: Payer: Self-pay | Admitting: *Deleted

## 2013-10-14 ENCOUNTER — Encounter: Payer: Self-pay | Admitting: *Deleted

## 2013-10-14 ENCOUNTER — Encounter: Payer: Self-pay | Admitting: Cardiology

## 2013-10-15 ENCOUNTER — Encounter: Payer: Self-pay | Admitting: Family Medicine

## 2013-10-23 ENCOUNTER — Encounter: Payer: Self-pay | Admitting: Family Medicine

## 2013-11-20 DIAGNOSIS — H40039 Anatomical narrow angle, unspecified eye: Secondary | ICD-10-CM | POA: Diagnosis not present

## 2013-11-20 DIAGNOSIS — H2510 Age-related nuclear cataract, unspecified eye: Secondary | ICD-10-CM | POA: Diagnosis not present

## 2013-11-20 LAB — HM DIABETES EYE EXAM

## 2013-11-24 ENCOUNTER — Encounter: Payer: Self-pay | Admitting: Family Medicine

## 2013-11-24 ENCOUNTER — Ambulatory Visit (INDEPENDENT_AMBULATORY_CARE_PROVIDER_SITE_OTHER): Payer: Commercial Managed Care - HMO | Admitting: Family Medicine

## 2013-11-24 VITALS — BP 126/88 | HR 87 | Temp 98.0°F | Ht 59.0 in | Wt 169.5 lb

## 2013-11-24 DIAGNOSIS — N184 Chronic kidney disease, stage 4 (severe): Secondary | ICD-10-CM | POA: Insufficient documentation

## 2013-11-24 DIAGNOSIS — E039 Hypothyroidism, unspecified: Secondary | ICD-10-CM

## 2013-11-24 DIAGNOSIS — E1129 Type 2 diabetes mellitus with other diabetic kidney complication: Secondary | ICD-10-CM

## 2013-11-24 DIAGNOSIS — N189 Chronic kidney disease, unspecified: Secondary | ICD-10-CM

## 2013-11-24 DIAGNOSIS — I5022 Chronic systolic (congestive) heart failure: Secondary | ICD-10-CM

## 2013-11-24 DIAGNOSIS — M2669 Other specified disorders of temporomandibular joint: Secondary | ICD-10-CM

## 2013-11-24 DIAGNOSIS — I10 Essential (primary) hypertension: Secondary | ICD-10-CM

## 2013-11-24 DIAGNOSIS — E785 Hyperlipidemia, unspecified: Secondary | ICD-10-CM

## 2013-11-24 DIAGNOSIS — M26629 Arthralgia of temporomandibular joint, unspecified side: Secondary | ICD-10-CM

## 2013-11-24 HISTORY — DX: Chronic kidney disease, stage 4 (severe): N18.4

## 2013-11-24 HISTORY — DX: Type 2 diabetes mellitus with other diabetic kidney complication: E11.29

## 2013-11-24 LAB — HEMOGLOBIN A1C: Hgb A1c MFr Bld: 6 % (ref 4.6–6.5)

## 2013-11-24 NOTE — Progress Notes (Signed)
Pre visit review using our clinic review tool, if applicable. No additional management support is needed unless otherwise documented below in the visit note. 

## 2013-11-24 NOTE — Progress Notes (Signed)
No chief complaint on file.   HPI:  Follow up:   Depression:  -takes zoloft and trazadone  -stable -denies: thoughts of self harm, SI   Diabetes:  -doing curves -diet is ok  -home BS: does not check -last eye exam: just went last week -last foot exam: -denies: polyuria, polydipsia, vision changes, foot lesions Lab Results  Component Value Date   HGBA1C 6.4 05/27/2013   Hypothyroid:  -on synthroid 125 mcg  -takes synthroid daily on empty stomach  -denies: palpitation, constipation, skin changes Lab Results  Component Value Date   TSH 5.60* 08/25/2013    COPD:  -uses alb prn  -stable   Hep C:  -followed and managed by hepatologist   Cardiomyopathy, CHF, HLD, HTN:  -followed and managed by cardiologist - started bidil and did AV optimization echo 2015 -most recent notes reviewed, appreciate care -denies: CP, SOB, palpitation, swelling  Renal Insufficiency: -worsening last visit and advised stopping acei and nephrotoxic drugs and referral to nephrologist -reports: seeing nephrologist with [name] and labs  -has appointment with nephrologist tomorrow per her report  L hip pain: -improved  L ear pain: -rare, sharp and brief -for a few month  -denies: hearing loss, fever, sustained pain, catching of jaw  ROS: See pertinent positives and negatives per HPI.  Past Medical History  Diagnosis Date  . LBBB (left bundle branch block)   . COPD (chronic obstructive pulmonary disease)   . Nonischemic cardiomyopathy     Followed by [facility]  . Biventricular implantable cardiac defibrillator in situ 12/24/2006    Protecta- [employer]tr[employer]  ICD gen change 2012  . Chronic systolic congestive heart failure   . Hepatitis C   . CHF (congestive heart failure)   . Hypothyroidism   . Personal history of other infectious and parasitic disease     Hepatitis B  . CONSTIPATION 02/28/2008    Qualifier: Diagnosis of  By: Hassell Done FNP, Tori Milks    . FIBROIDS, UTERUS 03/05/2008   Qualifier: Diagnosis of  By: Isla Pence    . OBSTRUCTIVE SLEEP APNEA 11/14/2007    npsg 2009:  Mild osa with AHI 11/hr. Started cpap 2009, quit using due to lack of response Home sleep testing 09/2010 with AHI only 7/hr and weight loss advised by pulmonology.  . OSTEOPENIA 09/30/2008    Qualifier: Diagnosis of  By: Hassell Done FNP, Tori Milks    . OBESITY 05/27/2009    Qualifier: Diagnosis of  By: Hassell Done FNP, Tori Milks    . DEPRESSION 12/17/2009    Annotation: PHQ-9 score = 14 done on 12/17/2009 Qualifier: Diagnosis of  By: Hassell Done FNP, Tori Milks    . Hyperlipemia 12/06/2012  . HYPERTENSION, BENIGN 04/24/2007    Qualifier: Diagnosis of  By: Hassell Done FNP, Tori Milks    . Asthma   . Arthritis   . Glaucoma     Past Surgical History  Procedure Laterality Date  . Appendectomy    . Cardiac catheterization    . 123456      Concerto 0000000  . Insert / replace / remove pacemaker      concerto    Family History  Problem Relation Age of Onset  . Asthma Father   . Asthma Sister   . Cancer Sister     lung    History   Social History  . Marital Status: Single    Spouse Name: N/A    Number of Children: N/A  . Years of Education: N/A   Social History Main Topics  . Smoking  status: Never Smoker   . Smokeless tobacco: Never Used  . Alcohol Use: Yes     Comment: 3 glasses of wine 3-4 times per week  . Drug Use: None  . Sexual Activity: None   Other Topics Concern  . 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      Lifestyle: no regular exercise; poor diet             Current outpatient prescriptions:aspirin 81 MG tablet, Take 81 mg by mouth daily.  , Disp: , Rfl: ;  atorvastatin (LIPITOR) 40 MG tablet, Take 1 tablet (40 mg total) by mouth daily., Disp: 90 tablet, Rfl: 3;  carvedilol (COREG) 25 MG tablet, Take 1 tablet (25 mg total) by mouth 2 (two) times daily with a meal.,  Disp: 180 tablet, Rfl: 3;  furosemide (LASIX) 40 MG tablet, Take 1 tablet (40 mg total) by mouth daily., Disp: 90 tablet, Rfl: 3 isosorbide-hydrALAZINE (BIDIL) 20-37.5 MG per tablet, Take 1 tablet by mouth 2 (two) times daily., Disp: 60 tablet, Rfl: 6;  levothyroxine (SYNTHROID, LEVOTHROID) 125 MCG tablet, TAKE 1 TABLET DAILY BEFORE BREAKFAST, Disp: 90 tablet, Rfl: 1;  lisinopril (PRINIVIL,ZESTRIL) 20 MG tablet, Take 1 tablet (20 mg total) by mouth 2 (two) times daily., Disp: 180 tablet, Rfl: 3;  meloxicam (MOBIC) 15 MG tablet, , Disp: , Rfl:  nitroGLYCERIN (NITROSTAT) 0.4 MG SL tablet, Place 1 tablet (0.4 mg total) under the tongue every 5 (five) minutes as needed., Disp: 25 tablet, Rfl: 6;  sertraline (ZOLOFT) 100 MG tablet, TAKE 1 TABLET EVERY DAY, Disp: 90 tablet, Rfl: 1;  spironolactone (ALDACTONE) 25 MG tablet, Take 1 tablet (25 mg total) by mouth daily., Disp: 90 tablet, Rfl: 3;  traZODone (DESYREL) 50 MG tablet, TAKE 1 TABLET AT BEDTIME AS NEEDED, Disp: 90 tablet, Rfl: 1  EXAM:  Filed Vitals:   11/24/13 1313  BP: 126/88  Pulse: 87  Temp: 98 F (36.7 C)    Body mass index is 34.22 kg/(m^2).  GENERAL: vitals reviewed and listed above, alert, oriented, appears well hydrated and in no acute distress  HEENT: atraumatic, conjunttiva clear, normal appearance of ear canals and TM, no TTP or bruits, no obvious abnormalities on inspection of external nose and ears, some TMJ crepitus, no catching  NECK: no obvious masses on inspection  LUNGS: clear to auscultation bilaterally, no wheezes, rales or rhonchi, good air movement  CV: HRRR, no peripheral edema  MS: moves all extremities without noticeable abnormality  PSYCH: pleasant and cooperative, no obvious depression or anxiety  ASSESSMENT AND PLAN:  Discussed the following assessment and plan:  Type 2 diabetes, controlled, with renal manifestation - Plan: Hemoglobin A1c, Microalbumin / creatinine urine ratio  Chronic kidney disease,  unspecified stage  HYPOTHYROIDISM NOS - Plan: TSH  HYPERTENSION, BENIGN  Chronic systolic heart failure  Hyperlipemia - Plan: Lipid panel  TMJ pain dysfunction syndrome  -FASTING labs -HEP for ear pain that I suspect is TMJ mild dysfunction -continue lifestyle changes -asked her to have labs from kidney doctor sent to [name] which she reports will be taken tomorrow -follow up 3 months -Patient advised to return or notify a doctor immediately if symptoms worsen or persist or new concerns arise.  There are no Patient Instructions on file for this visit.   [name]

## 2013-11-25 LAB — LIPID PANEL
Cholesterol: 173 mg/dL (ref 0–200)
HDL: 69.4 mg/dL (ref >39.00)
LDL CALC: 85 mg/dL (ref 0–99)
NONHDL: 103.6
Total CHOL/HDL Ratio: 2
Triglycerides: 94 mg/dL (ref 0.0–149.0)
VLDL: 18.8 mg/dL (ref 0.0–40.0)

## 2013-11-25 LAB — MICROALBUMIN / CREATININE URINE RATIO
Creatinine,U: 21.3 mg/dL
Microalb Creat Ratio: 36.2 mg/g — ABNORMAL HIGH (ref 0.0–30.0)
Microalb, Ur: 7.7 mg/dL — ABNORMAL HIGH (ref 0.0–1.9)

## 2013-11-25 LAB — TSH: TSH: 29.05 u[IU]/mL — ABNORMAL HIGH (ref 0.35–4.50)

## 2013-11-26 MED ORDER — LEVOTHYROXINE SODIUM 137 MCG PO TABS: 137.0000 ug | ORAL_TABLET | Freq: Every day | ORAL | Status: DC

## 2013-11-26 NOTE — Addendum Note (Signed)
Addended by: Agnes Lawrence on: 11/26/2013 11:38 AM   Modules accepted: Orders

## 2013-12-19 ENCOUNTER — Encounter: Payer: Self-pay | Admitting: Family Medicine

## 2013-12-29 ENCOUNTER — Encounter: Payer: Self-pay | Admitting: *Deleted

## 2014-01-09 ENCOUNTER — Other Ambulatory Visit: Payer: Self-pay | Admitting: *Deleted

## 2014-01-09 MED ORDER — LEVOTHYROXINE SODIUM 137 MCG PO TABS: 137.0000 ug | ORAL_TABLET | Freq: Every day | ORAL | Status: DC

## 2014-01-09 NOTE — Telephone Encounter (Signed)
Rx faxed to [phone].

## 2014-01-30 ENCOUNTER — Encounter: Payer: Self-pay | Admitting: *Deleted

## 2014-02-11 ENCOUNTER — Encounter: Payer: Self-pay | Admitting: Family Medicine

## 2014-02-12 ENCOUNTER — Encounter: Payer: Self-pay | Admitting: Family Medicine

## 2014-02-16 ENCOUNTER — Other Ambulatory Visit: Payer: Self-pay | Admitting: Nurse Practitioner

## 2014-02-16 DIAGNOSIS — C220 Liver cell carcinoma: Secondary | ICD-10-CM

## 2014-02-23 ENCOUNTER — Ambulatory Visit
Admission: RE | Admit: 2014-02-23 | Discharge: 2014-02-23 | Disposition: A | Discharge status: Home / Self Care | Payer: Commercial Managed Care - HMO | Source: Ambulatory Visit | Attending: Nurse Practitioner | Admitting: Nurse Practitioner

## 2014-02-23 DIAGNOSIS — C220 Liver cell carcinoma: Secondary | ICD-10-CM

## 2014-02-24 ENCOUNTER — Ambulatory Visit (INDEPENDENT_AMBULATORY_CARE_PROVIDER_SITE_OTHER): Payer: Commercial Managed Care - HMO | Admitting: Family Medicine

## 2014-02-24 ENCOUNTER — Encounter: Payer: Self-pay | Admitting: Family Medicine

## 2014-02-24 VITALS — BP 134/74 | HR 71 | Temp 98.4°F | Ht 59.0 in | Wt 165.5 lb

## 2014-02-24 DIAGNOSIS — I5022 Chronic systolic (congestive) heart failure: Secondary | ICD-10-CM

## 2014-02-24 DIAGNOSIS — E038 Other specified hypothyroidism: Secondary | ICD-10-CM

## 2014-02-24 DIAGNOSIS — E1129 Type 2 diabetes mellitus with other diabetic kidney complication: Secondary | ICD-10-CM

## 2014-02-24 DIAGNOSIS — N058 Unspecified nephritic syndrome with other morphologic changes: Secondary | ICD-10-CM

## 2014-02-24 DIAGNOSIS — I429 Cardiomyopathy, unspecified: Secondary | ICD-10-CM

## 2014-02-24 DIAGNOSIS — I428 Other cardiomyopathies: Secondary | ICD-10-CM

## 2014-02-24 DIAGNOSIS — F329 Major depressive disorder, single episode, unspecified: Secondary | ICD-10-CM

## 2014-02-24 DIAGNOSIS — I10 Essential (primary) hypertension: Secondary | ICD-10-CM

## 2014-02-24 DIAGNOSIS — K219 Gastro-esophageal reflux disease without esophagitis: Secondary | ICD-10-CM

## 2014-02-24 DIAGNOSIS — F32A Depression, unspecified: Secondary | ICD-10-CM

## 2014-02-24 DIAGNOSIS — B1920 Unspecified viral hepatitis C without hepatic coma: Secondary | ICD-10-CM

## 2014-02-24 DIAGNOSIS — Z23 Encounter for immunization: Secondary | ICD-10-CM

## 2014-02-24 DIAGNOSIS — N189 Chronic kidney disease, unspecified: Secondary | ICD-10-CM

## 2014-02-24 LAB — TSH: TSH: 8.73 u[IU]/mL — ABNORMAL HIGH (ref 0.35–4.50)

## 2014-02-24 LAB — H. PYLORI ANTIBODY, IGG: H Pylori IgG: NEGATIVE

## 2014-02-24 LAB — HEMOGLOBIN A1C: HEMOGLOBIN A1C: 6.2 % (ref 4.6–6.5)

## 2014-02-24 NOTE — Progress Notes (Signed)
No chief complaint on file.   HPI:  Nausea/Acid reflux: -started last week -nausea, acid and reflux, belching, threw up once, loose stool, bloating -no travel, antibiotic recently -denies: fevers, blood in stools, innabiity to tol po intake, fevers, abd pain, CP, SOB  Depression:  -takes zoloft and trazadone  -stable  -denies: thoughts of self harm, SI   Diabetes:  -doing curves  -diet is ok  -home BS: does not check  -last eye exam: just went last week  -denies: polyuria, polydipsia, vision changes, foot lesions  Lab Results  Component Value Date   HGBA1C 6.0 11/24/2013    Hypothyroid:  -on synthroid 125 mcg  -takes synthroid daily on empty stomach  -denies: palpitation, constipation, skin changes   COPD:  -uses alb prn  -stable   Hep C:  -followed and managed by hepatologist  -s/p harvoni treatement, reports had [name] yeasterda   Cardiomyopathy, CHF, HLD, HTN:  -followed and managed by cardiologist - started bidil    AV optimization echo 2015  -most recent notes reviewed, appreciate care  -denies: CP, SOB, palpitation, swelling   Renal Insufficiency:  -she reports saw nephrologist - has follow up in november -she had protein in the urine and advised to stop mobic  ROS: See pertinent positives and negatives per HPI.  Past Medical History  Diagnosis Date  . LBBB (left bundle branch block)   . COPD (chronic obstructive pulmonary disease)   . Nonischemic cardiomyopathy     Followed by [facility]  . Biventricular implantable cardiac defibrillator in situ 12/24/2006    Protecta- Medtronic  ICD gen change 2012  . Chronic systolic congestive heart failure   . Hepatitis C   . CHF (congestive heart failure)   . Hypothyroidism   . Personal history of other infectious and parasitic disease     Hepatitis B  . CONSTIPATION 02/28/2008    Qualifier: Diagnosis of  By: Hassell Done FNP, Tori Milks    . FIBROIDS, UTERUS 03/05/2008    Qualifier: Diagnosis of  By:  Isla Pence    . OBSTRUCTIVE SLEEP APNEA 11/14/2007    npsg 2009:  Mild osa with AHI 11/hr. Started cpap 2009, quit using due to lack of response Home sleep testing 09/2010 with AHI only 7/hr and weight loss advised by pulmonology.  . OSTEOPENIA 09/30/2008    Qualifier: Diagnosis of  By: Hassell Done FNP, Tori Milks    . OBESITY 05/27/2009    Qualifier: Diagnosis of  By: Hassell Done FNP, Tori Milks    . DEPRESSION 12/17/2009    Annotation: PHQ-9 score = 14 done on 12/17/2009 Qualifier: Diagnosis of  By: Hassell Done FNP, Tori Milks    . Hyperlipemia 12/06/2012  . HYPERTENSION, BENIGN 04/24/2007    Qualifier: Diagnosis of  By: Hassell Done FNP, Tori Milks    . Asthma   . Arthritis   . Glaucoma   . HEPATITIS C - s/p treatment with Harvoni, seeing hepatology, Dawn Drazek  01/04/2007    Qualifier: Diagnosis of  By: Hassell Done FNP, Tori Milks      Past Surgical History  Procedure Laterality Date  . Appendectomy    . Cardiac catheterization    . [id number]      Concerto 0000000  . Insert / replace / remove pacemaker      concerto    Family History  Problem Relation Age of Onset  . Asthma Father   . Asthma Sister   . Cancer Sister     lung    History   Social History  . Marital  Status: Single    Spouse Name: N/A    Number of Children: N/A  . Years of Education: N/A   Social History Main Topics  . Smoking status: Never Smoker   . Smokeless tobacco: Never Used  . Alcohol Use: Yes     Comment: 3 glasses of wine 3-4 times per week  . Drug Use: None  . Sexual Activity: None   Other Topics Concern  . 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  , Disp: , Rfl: ;  atorvastatin (LIPITOR) 40 MG tablet, Take 1 tablet (40 mg total) by mouth daily., Disp: 90 tablet,  Rfl: 3;  carvedilol (COREG) 25 MG tablet, Take 1 tablet (25 mg total) by mouth 2 (two) times daily with a meal., Disp: 180 tablet, Rfl: 3;  furosemide (LASIX) 40 MG tablet, Take 1 tablet (40 mg total) by mouth daily., Disp: 90 tablet, Rfl: 3 isosorbide-hydrALAZINE (BIDIL) 20-37.5 MG per tablet, Take 1 tablet by mouth 2 (two) times daily., Disp: 60 tablet, Rfl: 6;  levothyroxine (SYNTHROID) 137 MCG tablet, Take 1 tablet (137 mcg total) by mouth daily before breakfast., Disp: 90 tablet, Rfl: 0;  lisinopril (PRINIVIL,ZESTRIL) 20 MG tablet, Take 1 tablet (20 mg total) by mouth 2 (two) times daily., Disp: 180 tablet, Rfl: 3 nitroGLYCERIN (NITROSTAT) 0.4 MG SL tablet, Place 1 tablet (0.4 mg total) under the tongue every 5 (five) minutes as needed., Disp: 25 tablet, Rfl: 6;  sertraline (ZOLOFT) 100 MG tablet, TAKE 1 TABLET EVERY DAY, Disp: 90 tablet, Rfl: 1;  spironolactone (ALDACTONE) 25 MG tablet, Take 1 tablet (25 mg total) by mouth daily., Disp: 90 tablet, Rfl: 3;  traZODone (DESYREL) 50 MG tablet, TAKE 1 TABLET AT BEDTIME AS NEEDED, Disp: 90 tablet, Rfl: 1  EXAM:  Filed Vitals:   02/24/14 1258  BP: 134/74  Pulse: 71  Temp: 98.4 F (36.9 C)    Body mass index is 33.41 kg/(m^2).  GENERAL: vitals reviewed and listed above, alert, oriented, appears well hydrated and in no acute distress  HEENT: atraumatic, conjunttiva clear, no obvious abnormalities on inspection of external nose and ears  NECK: no obvious masses on inspection  LUNGS: clear to auscultation bilaterally, no wheezes, rales or rhonchi, good air movement  CV: HRRR, no peripheral edema  ABD: BS+, soft, NTTP  MS: moves all extremities without noticeable abnormality  PSYCH: pleasant and cooperative, no obvious depression or anxiety  ASSESSMENT AND PLAN:  Discussed the following assessment and plan:  Hepatitis C virus infection without hepatic coma, unspecified chronicity  Other specified hypothyroidism - Plan:  TSH  Depression  HYPERTENSION, BENIGN  Chronic systolic heart failure  Nonischemic cardiomyopathy  Type 2 diabetes, controlled, with renal manifestation - Plan: Hemoglobin A1c  Chronic kidney disease, unspecified stage  Gastroesophageal reflux disease, esophagitis presence not specified - Plan: H. pylori Antibody, IgG  -Patient advised to return or notify a doctor immediately if symptoms worsen or persist or new concerns arise.  Patient Instructions  BEFORE YOU LEAVE: -flu shot -labs -schedule 1 month follow up for the reflux   Prilosec 20mg  daily for 1 month - follow up in 1 month or  sooner if needed  Make sure you have the appointment with the kidney doctor  -We have ordered labs or studies at this visit. It can take up to 1-2 weeks for results and processing. We will contact you with instructions IF your results are abnormal. Normal results will be released to your [facility]. If you have not heard from [name] or can not find your results in [facility] in 2 weeks please contact our office.            [name]

## 2014-02-24 NOTE — Patient Instructions (Addendum)
BEFORE YOU LEAVE: -flu shot -labs -schedule 1 month follow up for the reflux   Prilosec 20mg  daily for 1 month - follow up in 1 month or sooner if needed  Make sure you have the appointment with the kidney doctor  -We have ordered labs or studies at this visit. It can take up to 1-2 weeks for results and processing. We will contact you with instructions IF your results are abnormal. Normal results will be released to your [facility]. If you have not heard from [name] or can not find your results in [facility] in 2 weeks please contact our office.

## 2014-02-24 NOTE — Progress Notes (Signed)
Pre visit review using our clinic review tool, if applicable. No additional management support is needed unless otherwise documented below in the visit note. 

## 2014-02-24 NOTE — Addendum Note (Signed)
Addended by: Agnes Lawrence on: 02/24/2014 01:43 PM   Modules accepted: Orders

## 2014-03-27 ENCOUNTER — Encounter: Payer: Self-pay | Admitting: Family Medicine

## 2014-04-13 ENCOUNTER — Telehealth: Payer: Self-pay | Admitting: Internal Medicine

## 2014-04-13 NOTE — Telephone Encounter (Signed)
Patient tells me she has been swelling most in her feet for the past week.  Slight swelling in LE. Denies weight gain. She is SOB, real tired, can't walk far. Advised office would contact her to make appt with PA/NP. She is agreeable and thankful for calling.

## 2014-04-13 NOTE — Telephone Encounter (Signed)
New Message  Pt called to request an earlier appt. Pt stated she is swelling and wants to be seen earlier. Please call back and discuss.

## 2014-04-13 NOTE — Telephone Encounter (Signed)
error 

## 2014-04-14 ENCOUNTER — Encounter: Payer: Self-pay | Admitting: Family Medicine

## 2014-04-14 ENCOUNTER — Ambulatory Visit (INDEPENDENT_AMBULATORY_CARE_PROVIDER_SITE_OTHER): Payer: Commercial Managed Care - HMO | Admitting: Family Medicine

## 2014-04-14 VITALS — BP 140/90 | HR 87 | Temp 97.8°F | Ht 59.0 in | Wt 161.0 lb

## 2014-04-14 DIAGNOSIS — N189 Chronic kidney disease, unspecified: Secondary | ICD-10-CM

## 2014-04-14 DIAGNOSIS — E1129 Type 2 diabetes mellitus with other diabetic kidney complication: Secondary | ICD-10-CM

## 2014-04-14 DIAGNOSIS — F329 Major depressive disorder, single episode, unspecified: Secondary | ICD-10-CM

## 2014-04-14 DIAGNOSIS — F32A Depression, unspecified: Secondary | ICD-10-CM

## 2014-04-14 DIAGNOSIS — E038 Other specified hypothyroidism: Secondary | ICD-10-CM

## 2014-04-14 DIAGNOSIS — J209 Acute bronchitis, unspecified: Secondary | ICD-10-CM

## 2014-04-14 DIAGNOSIS — N058 Unspecified nephritic syndrome with other morphologic changes: Secondary | ICD-10-CM

## 2014-04-14 MED ORDER — ALBUTEROL SULFATE HFA 108 (90 BASE) MCG/ACT IN AERS: 2.0000 | INHALATION_SPRAY | Freq: Four times a day (QID) | RESPIRATORY_TRACT | Status: DC | PRN

## 2014-04-14 MED ORDER — PREDNISONE 20 MG PO TABS: 40.0000 mg | ORAL_TABLET | Freq: Every day | ORAL | Status: DC

## 2014-04-14 NOTE — Patient Instructions (Signed)
-  take the prednisone, use the albuterol as needed for difficulty preathing  -follow up in 1-2 weeks or sooner if needed, - will check thyroid lab then when over this acute illness

## 2014-04-14 NOTE — Progress Notes (Signed)
Pre visit review using our clinic review tool, if applicable. No additional management support is needed unless otherwise documented below in the visit note. 

## 2014-04-14 NOTE — Progress Notes (Signed)
HPI:  Morgan Clay is a pleasant 68 yo F whom unfortunately has a complicated PMH with MMP.   Acute Visit for:  Cough/sob: - started about 1 week ago - nasal congestion, PND, productive cough and increased SOB and wheezing - she has a hx of COPD but reports out of her inhaler so has not used this -denies: sig mucus production, fevers, nausea, vomiting, CP - mild swelling in ankles and seeing her cardiologist tomorrow  Chronic issues  Nausea/Acid reflux: -seen about 6 weeks ago with PPI started and advised follow up in 1 month -reports: this has improved on the PPI -denies: fevers, blood in stools, innabiity to tol po intake, fevers, abd pain, CP, SOB  Depression:  -takes zoloft 100mg  and trazadone 50mg  prn at bedtime -stable - though does have chronic fatigue -denies: thoughts of self harm, SI   Diabetes:  -doing curves  -diet is ok  -home BS: does not check  -last eye exam: just went last week  -denies: polyuria, polydipsia, vision changes, foot lesions  Lab Results  Component Value Date   HGBA1C 6.0 11/24/2013    Hypothyroid:  -on synthroid 137 mcg  -takes synthroid daily on empty stomach  -denies: palpitation, constipation, skin changes   Hep C:  -followed and managed by hepatologist  -s/p harvoni in the past with cure per review recent labs but with sig fibrosis and scarring -per notes getting serial [name] and ok 02/2014  Cardiomyopathy, CHF, HLD, HTN:  -managed by her cardiologist -meds: asa 81mg , lipitor 40mg , coreg 25 mg bid, lasic 40mg  daily, bidil (isosorbide-hydralazine) 20-37.5 bid, lisinopril 20mg  daily, spironolactone 25 mg daily -she as had mild increase in ankle swelling bilat and is seeing her cardiologist -AV optimization echo 2015  -most recent notes reviewed, appreciate care  -denies: CP, SOB, palpitation, swelling   Renal Insufficiency/Proteinuria:  -seeing nephrologist for this - recent notes from [place] reviewed - stopping hep C tx and repeating labs in 3 months -she had protein in the urine and advised to stop mobic     ROS: See pertinent positives and negatives per HPI.  Past Medical History  Diagnosis Date  . LBBB (left bundle branch block)   . COPD (chronic obstructive pulmonary disease)   . Nonischemic cardiomyopathy     Followed by [facility]  . Biventricular implantable cardiac defibrillator in situ 12/24/2006    Protecta- Medtronic  ICD gen change 2012  . Chronic systolic congestive heart failure   . Hypothyroidism   . Personal history of other infectious and parasitic disease     Hepatitis B  . CONSTIPATION 02/28/2008    Qualifier: Diagnosis of  By: Hassell Done FNP, Tori Milks    . FIBROIDS, UTERUS 03/05/2008    Qualifier: Diagnosis of  By: Isla Pence    . OBSTRUCTIVE SLEEP APNEA 11/14/2007    npsg 2009:  Mild osa with AHI 11/hr. Started cpap 2009, quit using due to lack of response Home sleep testing 09/2010 with AHI only 7/hr and weight loss advised by pulmonology.  . OSTEOPENIA 09/30/2008    Qualifier: Diagnosis of  By: Hassell Done FNP, Tori Milks    . OBESITY 05/27/2009    Qualifier: Diagnosis of  By: Hassell Done FNP, Tori Milks    . DEPRESSION 12/17/2009    Annotation: PHQ-9 score = 14 done on 12/17/2009 Qualifier: Diagnosis of  By: Hassell Done FNP, Tori Milks    . Hyperlipemia 12/06/2012  . HYPERTENSION, BENIGN 04/24/2007    Qualifier: Diagnosis of  By: Hassell Done FNP, Tori Milks    .  Arthritis   . Glaucoma   . HEPATITIS C - s/p treatment with Harvoni, seeing hepatology, Dawn Drazek  01/04/2007    Qualifier: Diagnosis of  By: Hassell Done FNP, Tori Milks      Past Surgical History  Procedure Laterality Date  . Appendectomy    . Cardiac catheterization    . [id number]      Concerto 0000000  . Insert / replace / remove pacemaker      concerto    Family History  Problem Relation Age of Onset  . Asthma Father   . Asthma Sister   . Cancer Sister     lung    History   Social  History  . Marital Status: Single    Spouse Name: N/A    Number of Children: N/A  . Years of Education: N/A   Social History Main Topics  . Smoking status: Never Smoker   . Smokeless tobacco: Never Used  . Alcohol Use: Yes     Comment: 3 glasses of wine 3-4 times per week  . Drug Use: None  . Sexual Activity: None   Other Topics Concern  . 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  , Disp: , Rfl: ;  atorvastatin (LIPITOR) 40 MG tablet, Take 1 tablet (40 mg total) by mouth daily., Disp: 90 tablet, Rfl: 3;  carvedilol (COREG) 25 MG tablet, Take 1 tablet (25 mg total) by mouth 2 (two) times daily with a meal., Disp: 180 tablet, Rfl: 3;  furosemide (LASIX) 40 MG tablet, Take 1 tablet (40 mg total) by mouth daily., Disp: 90 tablet, Rfl: 3 isosorbide-hydrALAZINE (BIDIL) 20-37.5 MG per tablet, Take 1 tablet by mouth 2 (two) times daily., Disp: 60 tablet, Rfl: 6;  levothyroxine (SYNTHROID) 137 MCG tablet, Take 1 tablet (137 mcg total) by mouth daily before breakfast., Disp: 90 tablet, Rfl: 0;  lisinopril (PRINIVIL,ZESTRIL) 20 MG tablet, Take 1 tablet (20 mg total) by mouth 2 (two) times daily., Disp: 180 tablet, Rfl: 3 nitroGLYCERIN (NITROSTAT) 0.4 MG SL tablet, Place 1 tablet (0.4 mg total) under the tongue every 5 (five) minutes as needed., Disp: 25 tablet, Rfl: 6;  sertraline (ZOLOFT) 100 MG tablet, TAKE 1 TABLET EVERY DAY, Disp: 90 tablet, Rfl: 1;  spironolactone (ALDACTONE) 25 MG tablet, Take 1 tablet (25 mg total) by mouth daily., Disp: 90 tablet, Rfl: 3;  traZODone (DESYREL) 50 MG tablet, TAKE 1 TABLET AT BEDTIME AS NEEDED, Disp: 90 tablet, Rfl: 1 albuterol (PROVENTIL HFA;VENTOLIN HFA) 108 (90 BASE) MCG/ACT inhaler, Inhale 2 puffs into  the lungs every 6 (six) hours as needed., Disp: 1 Inhaler, Rfl: 0;  predniSONE (DELTASONE) 20 MG tablet, Take 2 tablets (40 mg total) by mouth daily with breakfast., Disp: 6 tablet, Rfl: 0  EXAM:  Filed Vitals:   04/14/14 1542  BP: 140/90  Pulse: 87  Temp: 97.8 F (36.6 C)    Body mass index is 32.5 kg/(m^2).  GENERAL: vitals reviewed and listed above, alert, oriented, appears well hydrated and in no acute distress  HEENT: atraumatic, conjunttiva clear, no obvious abnormalities on inspection of external nose and ears, normal appearance of ear canals and  TMs, clear nasal congestion, mild post oropharyngeal erythema with PND, no tonsillar edema or exudate, no sinus TTP  NECK: no obvious masses on inspection, no JVD  LUNGS: few scattered wheezes, better with coughing, O2 97% on RA  CV: HRRR, tr peripheral edema  MS: moves all extremities without noticeable abnormality  PSYCH: pleasant and cooperative, no obvious depression or anxiety  ASSESSMENT AND PLAN:  Discussed the following assessment and plan:  Acute bronchitis, unspecified organism - Plan: predniSONE (DELTASONE) 20 MG tablet, albuterol (PROVENTIL HFA;VENTOLIN HFA) 108 (90 BASE) MCG/ACT inhaler  Other specified hypothyroidism - Plan: TSH  Depression  Type 2 diabetes, controlled, with renal manifestation  Chronic kidney disease, unspecified stage  -suspect VURI in with mild bronchitis - opted for prednisone and refill alb -she is seeing her cardiologist tomorrow, no sig LE edema or signs of worsening HF on exam -check TSH at follow up in 1-2 weeks -Patient advised to return or notify a doctor immediately if symptoms worsen or persist or new concerns arise.  Patient Instructions  -take the prednisone, use the albuterol as needed for difficulty preathing  -follow up in 1-2 weeks or sooner if needed, - will check thyroid lab then when over this acute illness     Braydan Marriott R.

## 2014-04-15 ENCOUNTER — Telehealth: Payer: Self-pay | Admitting: *Deleted

## 2014-04-15 ENCOUNTER — Encounter: Payer: Self-pay | Admitting: Physician Assistant

## 2014-04-15 ENCOUNTER — Ambulatory Visit (INDEPENDENT_AMBULATORY_CARE_PROVIDER_SITE_OTHER): Payer: Commercial Managed Care - HMO | Admitting: Physician Assistant

## 2014-04-15 ENCOUNTER — Ambulatory Visit (INDEPENDENT_AMBULATORY_CARE_PROVIDER_SITE_OTHER): Payer: Commercial Managed Care - HMO | Admitting: *Deleted

## 2014-04-15 ENCOUNTER — Encounter: Payer: Self-pay | Admitting: Internal Medicine

## 2014-04-15 ENCOUNTER — Ambulatory Visit
Admission: RE | Admit: 2014-04-15 | Discharge: 2014-04-15 | Disposition: A | Discharge status: Home / Self Care | Payer: Commercial Managed Care - HMO | Source: Ambulatory Visit | Attending: Physician Assistant | Admitting: Physician Assistant

## 2014-04-15 VITALS — BP 150/70 | HR 89 | Ht 59.0 in | Wt 157.0 lb

## 2014-04-15 DIAGNOSIS — R0602 Shortness of breath: Secondary | ICD-10-CM

## 2014-04-15 DIAGNOSIS — I5023 Acute on chronic systolic (congestive) heart failure: Secondary | ICD-10-CM

## 2014-04-15 DIAGNOSIS — I428 Other cardiomyopathies: Secondary | ICD-10-CM

## 2014-04-15 DIAGNOSIS — E785 Hyperlipidemia, unspecified: Secondary | ICD-10-CM

## 2014-04-15 DIAGNOSIS — R070 Pain in throat: Secondary | ICD-10-CM

## 2014-04-15 DIAGNOSIS — R05 Cough: Secondary | ICD-10-CM

## 2014-04-15 DIAGNOSIS — I5022 Chronic systolic (congestive) heart failure: Secondary | ICD-10-CM

## 2014-04-15 DIAGNOSIS — R059 Cough, unspecified: Secondary | ICD-10-CM

## 2014-04-15 DIAGNOSIS — I429 Cardiomyopathy, unspecified: Secondary | ICD-10-CM

## 2014-04-15 DIAGNOSIS — R0609 Other forms of dyspnea: Secondary | ICD-10-CM

## 2014-04-15 DIAGNOSIS — R5382 Chronic fatigue, unspecified: Secondary | ICD-10-CM

## 2014-04-15 DIAGNOSIS — Z95810 Presence of automatic (implantable) cardiac defibrillator: Secondary | ICD-10-CM

## 2014-04-15 DIAGNOSIS — I10 Essential (primary) hypertension: Secondary | ICD-10-CM

## 2014-04-15 LAB — MDC_IDC_ENUM_SESS_TYPE_INCLINIC
Battery Voltage: 2.97 V
Brady Statistic AP VP Percent: 0.16 %
Brady Statistic AP VS Percent: 0.01 %
Brady Statistic AS VP Percent: 99.57 %
Brady Statistic RA Percent Paced: 0.17 %
Date Time Interrogation Session: 20151209174345
HIGH POWER IMPEDANCE MEASURED VALUE: 190 Ohm
HighPow Impedance: 342 Ohm
HighPow Impedance: 43 Ohm
HighPow Impedance: 65 Ohm
Lead Channel Impedance Value: 399 Ohm
Lead Channel Impedance Value: 4047 Ohm
Lead Channel Impedance Value: 418 Ohm
Lead Channel Impedance Value: 456 Ohm
Lead Channel Pacing Threshold Amplitude: 0.75 V
Lead Channel Pacing Threshold Amplitude: 1 V
Lead Channel Pacing Threshold Pulse Width: 0.4 ms
Lead Channel Pacing Threshold Pulse Width: 0.4 ms
Lead Channel Sensing Intrinsic Amplitude: 16.5 mV
Lead Channel Sensing Intrinsic Amplitude: 3.25 mV
Lead Channel Setting Pacing Amplitude: 2.5 V
Lead Channel Setting Sensing Sensitivity: 0.3 mV
MDC IDC MSMT LEADCHNL LV IMPEDANCE VALUE: 4047 Ohm
MDC IDC MSMT LEADCHNL LV PACING THRESHOLD PULSEWIDTH: 0.4 ms
MDC IDC MSMT LEADCHNL RV PACING THRESHOLD AMPLITUDE: 0.75 V
MDC IDC SET LEADCHNL LV PACING AMPLITUDE: 2 V
MDC IDC SET LEADCHNL LV PACING PULSEWIDTH: 0.4 ms
MDC IDC SET LEADCHNL RA PACING AMPLITUDE: 2 V
MDC IDC SET LEADCHNL RV PACING PULSEWIDTH: 0.4 ms
MDC IDC SET ZONE DETECTION INTERVAL: 330 ms
MDC IDC STAT BRADY AS VS PERCENT: 0.27 %
MDC IDC STAT BRADY RV PERCENT PACED: 99.72 %
Zone Setting Detection Interval: 280 ms
Zone Setting Detection Interval: 350 ms
Zone Setting Detection Interval: 400 ms

## 2014-04-15 LAB — CBC
HCT: 41.4 % (ref 36.0–46.0)
Hemoglobin: 13.7 g/dL (ref 12.0–15.0)
MCHC: 33 g/dL (ref 30.0–36.0)
MCV: 88.9 fl (ref 78.0–100.0)
PLATELETS: 152 10*3/uL (ref 150.0–400.0)
RBC: 4.66 Mil/uL (ref 3.87–5.11)
RDW: 17 % — ABNORMAL HIGH (ref 11.5–15.5)
WBC: 8.1 10*3/uL (ref 4.0–10.5)

## 2014-04-15 LAB — BRAIN NATRIURETIC PEPTIDE: PRO B NATRI PEPTIDE: 881 pg/mL — AB (ref 0.0–100.0)

## 2014-04-15 LAB — BASIC METABOLIC PANEL
BUN: 12 mg/dL (ref 6–23)
CALCIUM: 9.4 mg/dL (ref 8.4–10.5)
CO2: 27 mEq/L (ref 19–32)
Chloride: 97 mEq/L (ref 96–112)
Creatinine, Ser: 1 mg/dL (ref 0.4–1.2)
GFR: 70.48 mL/min (ref >60.00)
GLUCOSE: 87 mg/dL (ref 70–99)
Potassium: 2.9 mEq/L — ABNORMAL LOW (ref 3.5–5.1)
SODIUM: 134 meq/L — AB (ref 135–145)

## 2014-04-15 MED ORDER — POTASSIUM CHLORIDE ER 20 MEQ PO TBCR: EXTENDED_RELEASE_TABLET | ORAL | Status: DC

## 2014-04-15 NOTE — Progress Notes (Signed)
CRT-D device check in office. Thresholds and sensing consistent with previous device measurements. Lead impedance trends stable over time. No mode switch episodes recorded. 2 nst episodes--longest was 9 beats. Patient bi-ventricularly pacing 99% of the time. Device programmed with appropriate safety margins--LV output changed to 2.00 V. Optivol increase 10-17 and ongoing. Pt having swelling in feet. No increased SOB or weight gain. Pt seeing Scott today. Battery voltage 2.97 V. Patient enrolled in remote follow up with new monitor ordered for pt. ROV 04-30-14 with SK.

## 2014-04-15 NOTE — Progress Notes (Signed)
Cardiology Office Note   Date:  04/15/2014   ID:  Morgan Clay, DOB November 29, 1945, MRN [id number]  PCP:  [name]., DO  Cardiologist/Electrophysiologist:  Dr. Virl Axe    History of Present Illness: Morgan Clay is a 68 y.o. female with a history of nonischemic cardiomyopathy, systolic CHF, status post CRT-D, LBBB, hypothyroidism.  Most recent echocardiogram in 08/2013 has demonstrated normal LV function. Last seen by Dr. Caryl Comes in 06/2013.   She presents today for further evaluation of dyspnea increased over the past week. She feels fatigued. She saw her primary care physician yesterday. She was apparently wheezing. She was placed on albuterol and prednisone. Patient notes dyspnea with minimal activity. She describes NYHA class 3 symptoms. She denies orthopnea or PND. She feels that her LE edema has increased. She has a nonproductive cough. She denies chest discomfort but has noted some throat tightness. This can occur with exertion. She does note poor appetite. She's been losing weight. She denies palpitations.   Studies:   - LHC (4/06):  Normal coronary arteries, EF 20-25%  - Echo (4/15):  Severe LVH, EF 0000000, grade 2 diastolic dysfunction, mild AI, mild LAE, mild RVE, normal RV function, moderate TR, PASP 45 mmHg   Recent Labs: 09/04/2013: BUN 23; Creatinine 1.5*; Potassium 3.4*; Sodium 138 11/24/2013: LDL (calc) 85 02/24/2014: TSH 8.73*    Recent Radiology: No results found.    Wt Readings from Last 3 Encounters:  04/15/14 157 lb (71.215 kg)  04/14/14 161 lb (73.029 kg)  02/24/14 165 lb 8 oz (75.07 kg)     Past Medical History  Diagnosis Date  . LBBB (left bundle branch block)   . COPD (chronic obstructive pulmonary disease)   . Nonischemic cardiomyopathy     Follow[facility] by [facility]  . Biventricular implantable cardiac defibrillator in situ 12/24/2006    Protecta- Medtronic  ICD gen change 2012  . Chronic systolic congestive heart failure   .  Hypothyroidism   . Personal history of other infectious and parasitic disease     Hepatitis B  . CONSTIPATION 02/28/2008    Qualifier: Diagnosis of  By: Hassell Done FNP, Tori Milks    . FIBROIDS, UTERUS 03/05/2008    Qualifier: Diagnosis of  By: Isla Pence    . OBSTRUCTIVE SLEEP APNEA 11/14/2007    npsg 2009:  Mild osa with AHI 11/hr. Started cpap 2009, quit using due to lack of response Home sleep testing 09/2010 with AHI only 7/hr and weight loss advised by pulmonology.  . OSTEOPENIA 09/30/2008    Qualifier: Diagnosis of  By: Hassell Done FNP, Tori Milks    . OBESITY 05/27/2009    Qualifier: Diagnosis of  By: Hassell Done FNP, Tori Milks    . DEPRESSION 12/17/2009    Annotation: PHQ-9 score = 14 done on 12/17/2009 Qualifier: Diagnosis of  By: Hassell Done FNP, Tori Milks    . Hyperlipemia 12/06/2012  . HYPERTENSION, BENIGN 04/24/2007    Qualifier: Diagnosis of  By: Hassell Done FNP, Tori Milks    . Arthritis   . Glaucoma   . HEPATITIS C - s/p treatment with Harvoni, seeing hepatology, Dawn Drazek  01/04/2007    Qualifier: Diagnosis of  By: Hassell Done FNP, Tori Milks      Current Outpatient Prescriptions  Medication Sig Dispense Refill  . albuterol (PROVENTIL HFA;VENTOLIN HFA) 108 (90 BASE) MCG/ACT inhaler Inhale 2 puffs into the lungs every 6 (six) hours as needed. 1 Inhaler 0  . aspirin 81 MG tablet Take 81 mg by mouth daily.      [name]  atorvastatin (LIPITOR) 40 MG tablet Take 1 tablet (40 mg total) by mouth daily. 90 tablet 3  . carvedilol (COREG) 25 MG tablet Take 1 tablet (25 mg total) by mouth 2 (two) times daily with a meal. 180 tablet 3  . furosemide (LASIX) 40 MG tablet Take 1 tablet (40 mg total) by mouth daily. 90 tablet 3  . isosorbide-hydrALAZINE (BIDIL) 20-37.5 MG per tablet Take 1 tablet by mouth 2 (two) times daily. 60 tablet 6  . levothyroxine (SYNTHROID) 137 MCG tablet Take 1 tablet (137 mcg total) by mouth daily before breakfast. 90 tablet 0  . lisinopril (PRINIVIL,ZESTRIL) 20 MG tablet Take 1 tablet (20 mg total)  by mouth 2 (two) times daily. 180 tablet 3  . nitroGLYCERIN (NITROSTAT) 0.4 MG SL tablet Place 1 tablet (0.4 mg total) under the tongue every 5 (five) minutes as needed. 25 tablet 6  . predniSONE (DELTASONE) 20 MG tablet Take 2 tablets (40 mg total) by mouth daily with breakfast. 6 tablet 0  . sertraline (ZOLOFT) 100 MG tablet TAKE 1 TABLET EVERY DAY 90 tablet 1  . spironolactone (ALDACTONE) 25 MG tablet Take 1 tablet (25 mg total) by mouth daily. 90 tablet 3  . traZODone (DESYREL) 50 MG tablet TAKE 1 TABLET AT BEDTIME AS NEEDED 90 tablet 1   No current facility-administered medications for this visit.     Allergies:   Oxycodone-acetaminophen   Social History:  The patient  reports that she has never smoked. She has never used smokeless tobacco. She reports that she drinks alcohol.   Family History:  The patient's family history includes Asthma in her father and sister; Cancer in her sister; Diabetes in an other family member; Heart attack in her father and mother; Stroke in her brother.    ROS:  Please see the history of present illness.      All other systems reviewed and negative.    PHYSICAL EXAM: VS:  BP 150/70 mmHg  Pulse 89  Ht 4\' 11"  (1.499 m)  Wt 157 lb (71.215 kg)  BMI 31.69 kg/m2 Well nourished, well developed, in no acute distress HEENT: normal Neck: + JVD Cardiac:  normal S1, S2;  RRR; no murmur   Lungs:   clear to auscultation bilaterally, no wheezing, rhonchi or rales Abd: Distended, non-tender   Ext: no edema Skin: warm and dry Neuro:  CNs 2-12 intact, no focal abnormalities noted  EKG:  NSR, HR 89, BiV paced      ASSESSMENT AND PLAN:  1.  Acute on chronic systolic heart failure: Her device was interrogated today. Her Optivol measurements have increased since October. She has some signs and symptoms of volume excess.     -  Increase Lasix to 40 mg twice a day 1 week, then resume 40 mg daily.    -  Check BMET, BNP    -  Check CXR.    -  Repeat BMET in one  week along with BNP 2.  Nonischemic cardiomyopathy:  Recent echocardiogram demonstrated improved LV function. However, she is currently experiencing volume excess. She has had some throat discomfort with exertion as well. I will arrange a Myoview as noted below. This will help reassess her LV function.    -  Continue beta blocker, hydralazine, nitrates, ACEI, spironolactone. 3.  HYPERTENSION, BENIGN:  Blood pressure somewhat elevated today. Increase diuresis. Continue to monitor. 4.  Hyperlipemia: Continue statin. 5.  Biventricular ICD (implantable cardioverter-defibrillator) in place: Follow-up with EP as planned.  6.  Shortness  of breath:  Overall, I suspect that volume excess explains her symptoms. She does note fatigue. She also has some throat discomfort with exertion. She had a normal cardiac catheterization about 9 years ago. The likelihood that she has developed significant obstructive CAD is low. She does tell me that she is prediabetic which would put her at increased risk.    -  Obtain Lexiscan Myoview.    -  Check BMET, BNP, CBC.    Disposition:   FU with Dr. Virl Axe 2 weeks as already scheduled.    Signed, Versie Starks, MHS 04/15/2014 11:14 AM    [facility] [place], [place],   [address] Phone: [phone]; Fax: [phone]

## 2014-04-15 NOTE — Telephone Encounter (Signed)
pt notified about lab results and to start K+. Day one K+ 40 meq BID; starting day 2 change K+ to 20 meq BID, BMET 04/22/14. Pt agreeable to Plan of Care.

## 2014-04-15 NOTE — Patient Instructions (Addendum)
INCREASE LASIX 40 MG TWICE DAILY FOR 7 DAYS; AFTER 7 DAYS THEN GO BACK TO LASIX 40 MG DAILY  SCOTT WEAVER, PAC WOULD LIKE FOR YOU TO EAT BANANA FOR THE 7 DAYS WHILE ON THE INCREASED DOSE OF LASIX   LAB WORK TODAY; BMET, CBC , BNP  REPEAT BME, BNP IN 1 WEEK DUE TO MED CHANGES  Your physician has requested that you have a lexiscan myoview DX DOE, THROAT PAIN   A chest x-ray DX SOB, COUGH, takes a picture of the organs and structures inside the chest, including the heart, lungs, and blood vessels. This test can show several things, including, whether the heart is enlarges; whether fluid is building up in the lungs; and whether pacemaker / defibrillator leads are still in place.  KEEP YOUR APPT WITH DR. Caryl Comes 04/30/14

## 2014-04-17 ENCOUNTER — Encounter: Payer: Commercial Managed Care - HMO | Admitting: Family Medicine

## 2014-04-17 NOTE — Progress Notes (Signed)
Error   This encounter was created in error - please disregard. 

## 2014-04-22 ENCOUNTER — Telehealth: Payer: Self-pay | Admitting: *Deleted

## 2014-04-22 ENCOUNTER — Other Ambulatory Visit: Payer: Self-pay | Admitting: *Deleted

## 2014-04-22 ENCOUNTER — Other Ambulatory Visit (INDEPENDENT_AMBULATORY_CARE_PROVIDER_SITE_OTHER): Payer: Commercial Managed Care - HMO | Admitting: *Deleted

## 2014-04-22 ENCOUNTER — Ambulatory Visit (HOSPITAL_COMMUNITY): Payer: Commercial Managed Care - HMO | Attending: Cardiology | Admitting: Radiology

## 2014-04-22 DIAGNOSIS — I5022 Chronic systolic (congestive) heart failure: Secondary | ICD-10-CM

## 2014-04-22 DIAGNOSIS — R070 Pain in throat: Secondary | ICD-10-CM

## 2014-04-22 DIAGNOSIS — R0609 Other forms of dyspnea: Secondary | ICD-10-CM

## 2014-04-22 DIAGNOSIS — Z95810 Presence of automatic (implantable) cardiac defibrillator: Secondary | ICD-10-CM

## 2014-04-22 DIAGNOSIS — I429 Cardiomyopathy, unspecified: Secondary | ICD-10-CM

## 2014-04-22 DIAGNOSIS — R0602 Shortness of breath: Secondary | ICD-10-CM

## 2014-04-22 DIAGNOSIS — I10 Essential (primary) hypertension: Secondary | ICD-10-CM

## 2014-04-22 DIAGNOSIS — I428 Other cardiomyopathies: Secondary | ICD-10-CM

## 2014-04-22 LAB — BASIC METABOLIC PANEL
BUN: 24 mg/dL — ABNORMAL HIGH (ref 6–23)
CHLORIDE: 106 meq/L (ref 96–112)
CO2: 21 mEq/L (ref 19–32)
Calcium: 9.6 mg/dL (ref 8.4–10.5)
Creatinine, Ser: 1.2 mg/dL (ref 0.4–1.2)
GFR: 58.89 mL/min — ABNORMAL LOW (ref >60.00)
Glucose, Bld: 123 mg/dL — ABNORMAL HIGH (ref 70–99)
POTASSIUM: 4.5 meq/L (ref 3.5–5.1)
SODIUM: 136 meq/L (ref 135–145)

## 2014-04-22 LAB — BRAIN NATRIURETIC PEPTIDE: PRO B NATRI PEPTIDE: 703 pg/mL — AB (ref 0.0–100.0)

## 2014-04-22 MED ORDER — TECHNETIUM TC 99M SESTAMIBI GENERIC - CARDIOLITE
11.0000 | Freq: Once | INTRAVENOUS | Status: AC | PRN
  Administered 2014-04-22: 11 via INTRAVENOUS

## 2014-04-22 MED ORDER — ADENOSINE (DIAGNOSTIC) 3 MG/ML IV SOLN
0.5600 mg/kg | Freq: Once | INTRAVENOUS | Status: AC
  Administered 2014-04-22: 39.9 mg via INTRAVENOUS

## 2014-04-22 MED ORDER — FUROSEMIDE 40 MG PO TABS: 40.0000 mg | ORAL_TABLET | Freq: Two times a day (BID) | ORAL | Status: DC

## 2014-04-22 MED ORDER — TECHNETIUM TC 99M SESTAMIBI GENERIC - CARDIOLITE
33.0000 | Freq: Once | INTRAVENOUS | Status: AC | PRN
  Administered 2014-04-22: 33 via INTRAVENOUS

## 2014-04-22 NOTE — Progress Notes (Signed)
[facility] [address] [place], [place] [address] [phone]    Cardiology Nuclear Med Study  Morgan Clay is a 68 y.o. female     MRN : [id number]     DOB: April 01, 1946  Procedure Date: 04/22/2014  Nuclear Med Background Indication for Stress Test:  Evaluation for Ischemia History:  AICD, CHF Cardiac Risk Factors: Hypertension and LBBB  Symptoms:  DOE and SOB   Nuclear Pre-Procedure Caffeine/Decaff Intake:  None NPO After: 5:30 pm   Lungs:  clear O2 Sat: 94% on room air. IV 0.9% NS with Angio Cath:  22g  IV Site: R Antecubital  IV Started by:  Crissie Figures, RN  Chest Size (in):  36 Cup Size: B  Height: 4\' 11"  (1.499 m)  Weight:  157 lb (71.215 kg)  BMI:  Body mass index is 31.69 kg/(m^2). Tech Comments:  N/A    Nuclear Med Study 1 or 2 day study: 1 day  Stress Test Type:  Adenosine  Reading MD: N/A  Order Authorizing Provider:  Jolyn Nap, MD  Resting Radionuclide: Technetium 64m Sestamibi  Resting Radionuclide Dose: 11.0 mCi   Stress Radionuclide:  Technetium 84m Sestamibi  Stress Radionuclide Dose: 33.0 mCi           Stress Protocol Rest HR: 72 Stress HR: 83  Rest BP: 196/98 Stress BP: 214/94  Exercise Time (min): n/a METS: n/a   Predicted Max HR: 154 bpm % Max HR: 53.9 bpm Rate Pressure Product: 17762   Dose of Adenosine (mg):  40 Dose of Lexiscan: n/a mg  Dose of Atropine (mg): n/a Dose of Dobutamine: n/a mcg/kg/min (at max HR)  Stress Test Technologist: Crissie Figures, RN  Nuclear Technologist:  Earl Many, CNMT     Rest Procedure:  Myocardial perfusion imaging was performed at rest 45 minutes following the intravenous administration of Technetium 27m Sestamibi. Rest ECG: A sensed, V paced  Stress Procedure:  The patient received IV adenosine at 140 mcg/kg/min for 4-minutes with concurrent submaximal exercise (1.29mph, 0% grade).  Technetium 4m Sestamibi was injected at the 2-minute mark and quantitative spect  images were obtained. Stress ECG: No significant change from baseline ECG  QPS Raw Data Images:  Normal; no motion artifact; normal heart/lung ratio. Stress Images:  Normal homogeneous uptake in all areas of the myocardium. Rest Images:  Normal homogeneous uptake in all areas of the myocardium. Subtraction (SDS):  No evidence of ischemia. Transient Ischemic Dilatation (Normal <1.22):  0.96 Lung/Heart Ratio (Normal <0.45):  0.31  Quantitative Gated Spect Images QGS EDV:  133 ml QGS ESV:  89 ml  Impression Exercise Capacity:  Lexiscan with no exercise. BP Response:  Hypertensive blood pressure response. Clinical Symptoms:  No significant symptoms noted. ECG Impression:  No significant ECG changes with Lexiscan. Comparison with Prior Nuclear Study: No images to compare  Overall Impression:  "High-risk" study due to depressed LV function. No perfusion defects are seen. Findings suggest nonischemic cardiomyopathy.  LV Ejection Fraction: 33%.  LV Wall Motion:  Global hypokinesis, pacing-induced paradoxical septal motion and moderate to severely reduced overall systolic function.   Sanda Klein, MD, [facility] [phone] office [phone] pager

## 2014-04-22 NOTE — Telephone Encounter (Signed)
Pt notified about lab results and to stay on the lasix twice daily until she sees Dr. Caryl Comes next week, will get bmet then. Pt said ok and thank you.

## 2014-04-23 ENCOUNTER — Encounter: Payer: Self-pay | Admitting: Physician Assistant

## 2014-04-24 ENCOUNTER — Other Ambulatory Visit: Payer: Self-pay

## 2014-04-24 ENCOUNTER — Telehealth: Payer: Self-pay

## 2014-04-24 DIAGNOSIS — I428 Other cardiomyopathies: Secondary | ICD-10-CM

## 2014-04-24 NOTE — Telephone Encounter (Signed)
Called patient with results. Per Richardson Dopp PA-C, no ischemia, but EF lower again on this study, will arrange for follow-up echo prior to her visit with Dr. Caryl Comes on 04/30/14. Will forward to [facility].

## 2014-04-24 NOTE — Telephone Encounter (Signed)
-----   Message from Liliane Shi, [place] sent at 04/23/2014  5:35 PM EST ----- No ischemia But, EF lower again on this study. Arrange FU Echo prior to her visit with Dr. Virl Axe next week. Richardson Dopp, PA-C   04/23/2014 5:35 PM

## 2014-04-29 ENCOUNTER — Encounter: Payer: Self-pay | Admitting: Family Medicine

## 2014-04-29 ENCOUNTER — Ambulatory Visit (INDEPENDENT_AMBULATORY_CARE_PROVIDER_SITE_OTHER): Payer: Commercial Managed Care - HMO | Admitting: Family Medicine

## 2014-04-29 VITALS — BP 130/74 | HR 66 | Temp 97.9°F | Ht 59.0 in | Wt 152.4 lb

## 2014-04-29 DIAGNOSIS — I10 Essential (primary) hypertension: Secondary | ICD-10-CM

## 2014-04-29 DIAGNOSIS — I5022 Chronic systolic (congestive) heart failure: Secondary | ICD-10-CM

## 2014-04-29 DIAGNOSIS — F32A Depression, unspecified: Secondary | ICD-10-CM

## 2014-04-29 DIAGNOSIS — J449 Chronic obstructive pulmonary disease, unspecified: Secondary | ICD-10-CM

## 2014-04-29 DIAGNOSIS — F329 Major depressive disorder, single episode, unspecified: Secondary | ICD-10-CM

## 2014-04-29 DIAGNOSIS — N058 Unspecified nephritic syndrome with other morphologic changes: Secondary | ICD-10-CM

## 2014-04-29 DIAGNOSIS — N189 Chronic kidney disease, unspecified: Secondary | ICD-10-CM

## 2014-04-29 DIAGNOSIS — Z95810 Presence of automatic (implantable) cardiac defibrillator: Secondary | ICD-10-CM

## 2014-04-29 DIAGNOSIS — I429 Cardiomyopathy, unspecified: Secondary | ICD-10-CM

## 2014-04-29 DIAGNOSIS — R0600 Dyspnea, unspecified: Secondary | ICD-10-CM

## 2014-04-29 DIAGNOSIS — I428 Other cardiomyopathies: Secondary | ICD-10-CM

## 2014-04-29 DIAGNOSIS — E038 Other specified hypothyroidism: Secondary | ICD-10-CM

## 2014-04-29 DIAGNOSIS — E1129 Type 2 diabetes mellitus with other diabetic kidney complication: Secondary | ICD-10-CM

## 2014-04-29 LAB — BASIC METABOLIC PANEL
BUN: 19 mg/dL (ref 6–23)
CHLORIDE: 104 meq/L (ref 96–112)
CO2: 26 mEq/L (ref 19–32)
Calcium: 9.7 mg/dL (ref 8.4–10.5)
Creatinine, Ser: 1.2 mg/dL (ref 0.4–1.2)
GFR: 58.89 mL/min — ABNORMAL LOW (ref >60.00)
Glucose, Bld: 106 mg/dL — ABNORMAL HIGH (ref 70–99)
POTASSIUM: 4.2 meq/L (ref 3.5–5.1)
SODIUM: 138 meq/L (ref 135–145)

## 2014-04-29 LAB — TSH: TSH: 6.03 u[IU]/mL — AB (ref 0.35–4.50)

## 2014-04-29 NOTE — Progress Notes (Signed)
Pre visit review using our clinic review tool, if applicable. No additional management support is needed unless otherwise documented below in the visit note. 

## 2014-04-29 NOTE — Progress Notes (Signed)
HPI:  Follow up:  Dyspnea: -presented with reported productive cough, wheezing and SOB a few weeks ago and had wheezing on exam and treated for COPD exacerbation -saw her cardiologist recently and felt had worsening CsCHF, had myoview with EF worsened, lasix increased and seeing Dr. Caryl Comes tomorrow with repeat echo, CXR showed mild pulm edema, most recent bmet 12/16 ok -reports: doing much - breathing is much improved and no cough, has lost a few lbs on increased lasix dose -denies: CP, palpitations, swelling SOB, DOE  Other chronic issues:  GERD: -on PPI prn -reports: this has improved on the PPI -denies: fevers, blood in stools, innabiity to tol po intake, fevers, abd pain, CP, SOB  Depression:  -takes zoloft 100mg  and trazadone 50mg  prn at bedtime -stable - this is "getting better" -denies: thoughts of self harm, SI   Diabetes:  -was doing curves - plans to get back to this -diet is ok  -home BS: does not check  -last eye exam: UTD -denies: polyuria, polydipsia, vision changes, foot lesions   Hypothyroid:  -on synthroid 137 mcg  -takes synthroid daily on empty stomach  -denies: palpitation, constipation, skin changes   Hep C:  -followed and managed by hepatologist  -s/p harvoni in the past with cure per review recent labs but with sig fibrosis and scarring -per notes getting serial USs and ok 02/2014  Cardiomyopathy, CHF, HLD, HTN:  -managed by her cardiologist -meds: asa 81mg , lipitor 40mg , coreg 25 mg bid, lasix 40mg  bid currently, bidil (isosorbide-hydralazine) 20-37.5 bid, lisinopril 20mg  daily, spironolactone 25 mg daily -AV optimization echo 2015 , myoview 04/2014, having echo and eval with Dr. Caryl Comes tomorrow -most recent notes reviewed, appreciate care  -denies: CP, SOB, palpitation, swelling   Renal Insufficiency/Proteinuria:  -seeing nephrologist for this - recent notes from [place] reviewed - stopping hep C tx and  monitoring labs -she had protein in the urine and was advised to stop mobic  HM: Needs mammogram   ROS: See pertinent positives and negatives per HPI.  Past Medical History  Diagnosis Date  . LBBB (left bundle branch block)   . COPD (chronic obstructive pulmonary disease)   . Nonischemic cardiomyopathy     Followed by [facility]  . Biventricular implantable cardiac defibrillator in situ 12/24/2006    Protecta- Medtronic  ICD gen change 2012  . Chronic systolic congestive heart failure   . Hypothyroidism   . Personal history of other infectious and parasitic disease     Hepatitis B  . CONSTIPATION 02/28/2008    Qualifier: Diagnosis of  By: Hassell Done FNP, Tori Milks    . FIBROIDS, UTERUS 03/05/2008    Qualifier: Diagnosis of  By: Isla Pence    . OBSTRUCTIVE SLEEP APNEA 11/14/2007    npsg 2009:  Mild osa with AHI 11/hr. Started cpap 2009, quit using due to lack of response Home sleep testing 09/2010 with AHI only 7/hr and weight loss advised by pulmonology.  . OSTEOPENIA 09/30/2008    Qualifier: Diagnosis of  By: Hassell Done FNP, Tori Milks    . OBESITY 05/27/2009    Qualifier: Diagnosis of  By: Hassell Done FNP, Tori Milks    . DEPRESSION 12/17/2009    Annotation: PHQ-9 score = 14 done on 12/17/2009 Qualifier: Diagnosis of  By: Hassell Done FNP, Tori Milks    . Hyperlipemia 12/06/2012  . HYPERTENSION, BENIGN 04/24/2007    Qualifier: Diagnosis of  By: Hassell Done FNP, Tori Milks    . Arthritis   . Glaucoma   . HEPATITIS C - s/p  treatment with Harvoni, seeing hepatology, [place]  01/04/2007    Qualifier: Diagnosis of  By: Hassell Done FNP, Tori Milks    . Hx of cardiovascular stress test     Lexiscan Myoview (12/15): No ischemia, EF 33%, high risk due to depressed LV function     Past Surgical History  Procedure Laterality Date  . Appendectomy    . Cardiac catheterization    . [id number]      Concerto 0000000  . Insert / replace / remove pacemaker      concerto    Family History  Problem Relation Age of  Onset  . Asthma Father   . Asthma Sister   . Cancer Sister     lung  . Diabetes    . Heart attack Mother   . Heart attack Father   . Stroke Brother     History   Social History  . Marital Status: Single    Spouse Name: N/A    Number of Children: N/A  . Years of Education: N/A   Social History Main Topics  . Smoking status: Never Smoker   . Smokeless tobacco: Never Used  . Alcohol Use: Yes     Comment: 3 glasses of wine 3-4 times per week  . Drug Use: None  . Sexual Activity: None   Other Topics Concern  . None   Social History Narrative   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      Home Situation: lives alone      Spiritual Beliefs: [place]      Lifestyle: no regular exercise; poor diet             Current outpatient prescriptions: albuterol (PROVENTIL HFA;VENTOLIN HFA) 108 (90 BASE) MCG/ACT inhaler, Inhale 2 puffs into the lungs every 6 (six) hours as needed., Disp: 1 Inhaler, Rfl: 0;  aspirin 81 MG tablet, Take 81 mg by mouth daily.  , Disp: , Rfl: ;  atorvastatin (LIPITOR) 40 MG tablet, Take 1 tablet (40 mg total) by mouth daily., Disp: 90 tablet, Rfl: 3 carvedilol (COREG) 25 MG tablet, Take 1 tablet (25 mg total) by mouth 2 (two) times daily with a meal., Disp: 180 tablet, Rfl: 3;  furosemide (LASIX) 40 MG tablet, Take 1 tablet (40 mg total) by mouth 2 (two) times daily., Disp: , Rfl: ;  isosorbide-hydrALAZINE (BIDIL) 20-37.5 MG per tablet, Take 1 tablet by mouth 2 (two) times daily., Disp: 60 tablet, Rfl: 6 levothyroxine (SYNTHROID) 137 MCG tablet, Take 1 tablet (137 mcg total) by mouth daily before breakfast., Disp: 90 tablet, Rfl: 0;  lisinopril (PRINIVIL,ZESTRIL) 20 MG tablet, Take 1 tablet (20 mg total) by mouth 2 (two) times daily., Disp: 180 tablet, Rfl: 3;  nitroGLYCERIN (NITROSTAT) 0.4 MG SL tablet, Place 1 tablet (0.4 mg total) under the tongue every 5 (five) minutes as needed., Disp: 25 tablet, Rfl: 6 omeprazole (PRILOSEC) 20 MG capsule,  Take 20 mg by mouth daily., Disp: , Rfl: ;  Potassium Chloride ER 20 MEQ TBCR, Day ONE take 2 tabs twice that day; Starting as of day TWO you will start on 1 tab twice daily, Disp: 70 tablet, Rfl: 6;  sertraline (ZOLOFT) 100 MG tablet, TAKE 1 TABLET EVERY DAY, Disp: 90 tablet, Rfl: 1;  spironolactone (ALDACTONE) 25 MG tablet, Take 1 tablet (25 mg total) by mouth daily., Disp: 90 tablet, Rfl: 3 traZODone (DESYREL) 50 MG tablet, TAKE 1 TABLET AT BEDTIME AS NEEDED, Disp: 90 tablet, Rfl: 1  EXAM:  Filed  Vitals:   04/29/14 0916  BP: 130/74  Pulse: 66  Temp: 97.9 F (36.6 C)    Body mass index is 30.76 kg/(m^2).  GENERAL: vitals reviewed and listed above, alert, oriented, appears well hydrated and in no acute distress  HEENT: atraumatic, conjunttiva clear, no obvious abnormalities on inspection of external nose and ears  NECK: no obvious masses on inspection  LUNGS: clear to auscultation bilaterally, no wheezes, rales or rhonchi, good air movement  CV: HRRR, no peripheral edema  MS: moves all extremities without noticeable abnormality  PSYCH: pleasant and cooperative, no obvious depression or anxiety  ASSESSMENT AND PLAN:  Discussed the following assessment and plan:  Dyspnea  Other specified hypothyroidism - Plan: TSH  Depression  Chronic systolic heart failure  Chronic obstructive pulmonary disease, unspecified COPD, unspecified chronic bronchitis type  HYPERTENSION, BENIGN  Biventricular ICD (implantable cardioverter-defibrillator) in place  Nonischemic cardiomyopathy  Type 2 diabetes, controlled, with renal manifestation  Chronic kidney disease, unspecified stage   -follow up with cards as scheduled - dyspnea and cough resolved -TSH today -advised mammogram -follow up with me in 2 months -Patient advised to return or notify a doctor immediately if symptoms worsen or persist or new concerns arise.  Patient Instructions  BEFORE YOU LEAVE: -labs -schedule  follow up with me in 3 months  Schedule your mammogram - [phone]  We recommend the following healthy lifestyle measures: - eat a healthy diet consisting of lots of vegetables, fruits, beans, nuts, seeds, healthy meats such as white chicken and fish and whole grains.  - avoid fried foods, fast food, processed foods, sodas, red meet and other fattening foods.  - get a least 150 minutes of aerobic exercise per week as approved by your cardiologist  Keep appointment for echo and with your cardiologist tomorrow      [name]

## 2014-04-29 NOTE — Patient Instructions (Signed)
BEFORE YOU LEAVE: -labs -schedule follow up with me in 3 months  Schedule your mammogram - [phone]  We recommend the following healthy lifestyle measures: - eat a healthy diet consisting of lots of vegetables, fruits, beans, nuts, seeds, healthy meats such as white chicken and fish and whole grains.  - avoid fried foods, fast food, processed foods, sodas, red meet and other fattening foods.  - get a least 150 minutes of aerobic exercise per week as approved by your cardiologist  Keep appointment for echo and with your cardiologist tomorrow

## 2014-04-30 ENCOUNTER — Ambulatory Visit (INDEPENDENT_AMBULATORY_CARE_PROVIDER_SITE_OTHER): Payer: Commercial Managed Care - HMO | Admitting: Internal Medicine

## 2014-04-30 ENCOUNTER — Other Ambulatory Visit: Payer: Self-pay | Admitting: *Deleted

## 2014-04-30 ENCOUNTER — Other Ambulatory Visit (INDEPENDENT_AMBULATORY_CARE_PROVIDER_SITE_OTHER): Payer: Commercial Managed Care - HMO | Admitting: *Deleted

## 2014-04-30 ENCOUNTER — Encounter: Payer: Self-pay | Admitting: Internal Medicine

## 2014-04-30 ENCOUNTER — Ambulatory Visit (HOSPITAL_COMMUNITY): Payer: Commercial Managed Care - HMO | Attending: Cardiology | Admitting: Radiology

## 2014-04-30 VITALS — BP 160/70 | HR 68 | Ht 59.0 in | Wt 153.6 lb

## 2014-04-30 DIAGNOSIS — J449 Chronic obstructive pulmonary disease, unspecified: Secondary | ICD-10-CM | POA: Diagnosis not present

## 2014-04-30 DIAGNOSIS — E038 Other specified hypothyroidism: Secondary | ICD-10-CM

## 2014-04-30 DIAGNOSIS — E119 Type 2 diabetes mellitus without complications: Secondary | ICD-10-CM | POA: Insufficient documentation

## 2014-04-30 DIAGNOSIS — I083 Combined rheumatic disorders of mitral, aortic and tricuspid valves: Secondary | ICD-10-CM | POA: Insufficient documentation

## 2014-04-30 DIAGNOSIS — I429 Cardiomyopathy, unspecified: Secondary | ICD-10-CM

## 2014-04-30 DIAGNOSIS — I428 Other cardiomyopathies: Secondary | ICD-10-CM

## 2014-04-30 DIAGNOSIS — I5022 Chronic systolic (congestive) heart failure: Secondary | ICD-10-CM | POA: Insufficient documentation

## 2014-04-30 DIAGNOSIS — E669 Obesity, unspecified: Secondary | ICD-10-CM | POA: Diagnosis not present

## 2014-04-30 DIAGNOSIS — I517 Cardiomegaly: Secondary | ICD-10-CM | POA: Diagnosis not present

## 2014-04-30 DIAGNOSIS — E785 Hyperlipidemia, unspecified: Secondary | ICD-10-CM | POA: Insufficient documentation

## 2014-04-30 DIAGNOSIS — N189 Chronic kidney disease, unspecified: Secondary | ICD-10-CM | POA: Insufficient documentation

## 2014-04-30 DIAGNOSIS — I447 Left bundle-branch block, unspecified: Secondary | ICD-10-CM | POA: Diagnosis not present

## 2014-04-30 DIAGNOSIS — I10 Essential (primary) hypertension: Secondary | ICD-10-CM

## 2014-04-30 DIAGNOSIS — Z95810 Presence of automatic (implantable) cardiac defibrillator: Secondary | ICD-10-CM

## 2014-04-30 LAB — MDC_IDC_ENUM_SESS_TYPE_INCLINIC
Battery Voltage: 2.96 V
Brady Statistic AP VP Percent: 0.39 %
Brady Statistic AS VP Percent: 99.5 %
Brady Statistic RA Percent Paced: 0.4 %
Brady Statistic RV Percent Paced: 99.89 %
HighPow Impedance: 190 Ohm
HighPow Impedance: 361 Ohm
HighPow Impedance: 50 Ohm
HighPow Impedance: 73 Ohm
Lead Channel Impedance Value: 4047 Ohm
Lead Channel Impedance Value: 475 Ohm
Lead Channel Pacing Threshold Amplitude: 0.75 V
Lead Channel Pacing Threshold Amplitude: 1 V
Lead Channel Pacing Threshold Pulse Width: 0.4 ms
Lead Channel Pacing Threshold Pulse Width: 0.4 ms
Lead Channel Sensing Intrinsic Amplitude: 4 mV
Lead Channel Sensing Intrinsic Amplitude: 4 mV
Lead Channel Setting Pacing Amplitude: 2 V
MDC IDC MSMT LEADCHNL LV IMPEDANCE VALUE: 4047 Ohm
MDC IDC MSMT LEADCHNL LV IMPEDANCE VALUE: 418 Ohm
MDC IDC MSMT LEADCHNL LV PACING THRESHOLD PULSEWIDTH: 0.4 ms
MDC IDC MSMT LEADCHNL RV IMPEDANCE VALUE: 456 Ohm
MDC IDC MSMT LEADCHNL RV PACING THRESHOLD AMPLITUDE: 0.75 V
MDC IDC MSMT LEADCHNL RV SENSING INTR AMPL: 18.625 mV
MDC IDC MSMT LEADCHNL RV SENSING INTR AMPL: 18.625 mV
MDC IDC SESS DTM: 20151224090047
MDC IDC SET LEADCHNL LV PACING PULSEWIDTH: 0.4 ms
MDC IDC SET LEADCHNL RA PACING AMPLITUDE: 2 V
MDC IDC SET LEADCHNL RV PACING AMPLITUDE: 2.5 V
MDC IDC SET LEADCHNL RV PACING PULSEWIDTH: 0.4 ms
MDC IDC SET LEADCHNL RV SENSING SENSITIVITY: 0.3 mV
MDC IDC SET ZONE DETECTION INTERVAL: 330 ms
MDC IDC SET ZONE DETECTION INTERVAL: 400 ms
MDC IDC STAT BRADY AP VS PERCENT: 0.01 %
MDC IDC STAT BRADY AS VS PERCENT: 0.1 %
Zone Setting Detection Interval: 280 ms
Zone Setting Detection Interval: 350 ms

## 2014-04-30 LAB — BASIC METABOLIC PANEL
BUN: 21 mg/dL (ref 6–23)
CALCIUM: 9.4 mg/dL (ref 8.4–10.5)
CO2: 21 mEq/L (ref 19–32)
CREATININE: 1.1 mg/dL (ref 0.4–1.2)
Chloride: 104 mEq/L (ref 96–112)
GFR: 61.28 mL/min (ref >60.00)
Glucose, Bld: 120 mg/dL — ABNORMAL HIGH (ref 70–99)
Potassium: 4.3 mEq/L (ref 3.5–5.1)
SODIUM: 135 meq/L (ref 135–145)

## 2014-04-30 MED ORDER — LEVOTHYROXINE SODIUM 150 MCG PO TABS: 150.0000 ug | ORAL_TABLET | Freq: Every day | ORAL | Status: DC

## 2014-04-30 NOTE — Progress Notes (Signed)
Echocardiogram performed.  

## 2014-04-30 NOTE — Addendum Note (Signed)
Addended by: Eulis Foster on: 04/30/2014 09:18 AM   Modules accepted: Orders

## 2014-04-30 NOTE — Progress Notes (Signed)
Patient Care Team: [name], DO as PCP - General (Family Medicine)   HPI  Morgan Clay is a 68 y.o. female Seen in followup for CRT-D implanted for nonischemic cardiomyopathy. This is undertaken 2008 with generator change 2012. She has significant improvement from her CRT-D. However, 12/15 she presented by way of her PCP with increasing symptoms of shortness of breath. Device interrogation with optimal demonstrated significant change in intrathoracic impedance and her diuretics were adjusted. Repeat interrogation today demonstrates normalization  Her symptoms are much improved. Interestingly, not withstanding the development of edema, she says her weight was falling. Her edema is now resolved.   Myoview 12/15 no ischemia EF 33% Past Medical History  Diagnosis Date  . LBBB (left bundle branch block)   . COPD (chronic obstructive pulmonary disease)   . Nonischemic cardiomyopathy     Followed by [facility]  . Biventricular implantable cardiac defibrillator in situ 12/24/2006    Protecta- Medtronic  ICD gen change 2012  . Chronic systolic congestive heart failure   . Hypothyroidism   . Personal history of other infectious and parasitic disease     Hepatitis B  . CONSTIPATION 02/28/2008    Qualifier: Diagnosis of  By: Hassell Done FNP, Tori Milks    . FIBROIDS, UTERUS 03/05/2008    Qualifier: Diagnosis of  By: Isla Pence    . OBSTRUCTIVE SLEEP APNEA 11/14/2007    npsg 2009:  Mild osa with AHI 11/hr. Started cpap 2009, quit using due to lack of response Home sleep testing 09/2010 with AHI only 7/hr and weight loss advised by pulmonology.  . OSTEOPENIA 09/30/2008    Qualifier: Diagnosis of  By: Hassell Done FNP, Tori Milks    . OBESITY 05/27/2009    Qualifier: Diagnosis of  By: Hassell Done FNP, Tori Milks    . DEPRESSION 12/17/2009    Annotation: PHQ-9 score = 14 done on 12/17/2009 Qualifier: Diagnosis of  By: Hassell Done FNP, Tori Milks    . Hyperlipemia 12/06/2012  . HYPERTENSION, BENIGN  04/24/2007    Qualifier: Diagnosis of  By: Hassell Done FNP, Tori Milks    . Arthritis   . Glaucoma   . HEPATITIS C - s/p treatment with Harvoni, seeing hepatology, [place]  01/04/2007    Qualifier: Diagnosis of  By: Hassell Done FNP, Tori Milks    . Hx of cardiovascular stress test     Lexiscan Myoview (12/15): No ischemia, EF 33%, high risk due to depressed LV function     Past Surgical History  Procedure Laterality Date  . Appendectomy    . Cardiac catheterization    . [id number]      Concerto 0000000  . Insert / replace / remove pacemaker      concerto    Current Outpatient Prescriptions  Medication Sig Dispense Refill  . albuterol (PROVENTIL HFA;VENTOLIN HFA) 108 (90 BASE) MCG/ACT inhaler Inhale 2 puffs into the lungs every 6 (six) hours as needed. 1 Inhaler 0  . aspirin 81 MG tablet Take 81 mg by mouth daily.      [name] atorvastatin (LIPITOR) 40 MG tablet Take 1 tablet (40 mg total) by mouth daily. 90 tablet 3  . carvedilol (COREG) 25 MG tablet Take 1 tablet (25 mg total) by mouth 2 (two) times daily with a meal. 180 tablet 3  . furosemide (LASIX) 40 MG tablet Take 1 tablet (40 mg total) by mouth 2 (two) times daily.    . isosorbide-hydrALAZINE (BIDIL) 20-37.5 MG per tablet Take 1 tablet by mouth 2 (two) times daily.  60 tablet 6  . levothyroxine (SYNTHROID) 137 MCG tablet Take 1 tablet (137 mcg total) by mouth daily before breakfast. 90 tablet 0  . lisinopril (PRINIVIL,ZESTRIL) 20 MG tablet Take 1 tablet (20 mg total) by mouth 2 (two) times daily. 180 tablet 3  . nitroGLYCERIN (NITROSTAT) 0.4 MG SL tablet Place 1 tablet (0.4 mg total) under the tongue every 5 (five) minutes as needed. 25 tablet 6  . omeprazole (PRILOSEC) 20 MG capsule Take 20 mg by mouth daily.    . Potassium Chloride ER 20 MEQ TBCR Day ONE take 2 tabs twice that day; Starting as of day TWO you will start on 1 tab twice daily 70 tablet 6  . sertraline (ZOLOFT) 100 MG tablet TAKE 1 TABLET EVERY DAY 90 tablet 1  . spironolactone  (ALDACTONE) 25 MG tablet Take 1 tablet (25 mg total) by mouth daily. 90 tablet 3  . traZODone (DESYREL) 50 MG tablet TAKE 1 TABLET AT BEDTIME AS NEEDED 90 tablet 1   No current facility-administered medications for this visit.    Allergies  Allergen Reactions  . Oxycodone-Acetaminophen     Percocet    Review of Systems negative except from HPI and PMH  Physical Exam BP 160/70 mmHg  Pulse 68  Ht 4\' 11"  (1.499 m)  Wt 153 lb 9.6 oz (69.673 kg)  BMI 31.01 kg/m2  SpO2 97% Well developed and well nourished in no acute distress HENT normal E scleral and icterus clear Neck Supple JVP 7-8 cm carotids brisk and full Device pocket well healed; without hematoma or erythema.  There is no tethering Clear to ausculation regular rate and rhythm, no murmurs gallops or rub Soft with active bowel sounds No clubbing cyanosis   Edema Alert and oriented, grossly normal motor and sensory function Skin Warm and Dry  ECG P. Synchronous Bi pacing Assessment and  Plan  Chronic systolic heart failure  Nonischemic cardiomyopathy  Hypertension  CRT-D- Medtronic   Device interrogation demonstrated normalization of her optivol index. Her congestive heart failure symptoms are much improved. She is now euvolemic. However, her blood pressure remains quite elevated. This is true also her last visit. We will increase her hydralazine/nitrate to 1-1/2 pills twice daily. In addition, she is advised that if she notes recurrent peripheral edema to take extra furosemide for 1-2 days. We will arrange follow-up in 6-8 weeks for assessment of blood pressure and volume status. It may be appropriate to further uptitrate hydralazine nitrates

## 2014-04-30 NOTE — Addendum Note (Signed)
Addended by: Lahoma Crocker A on: 04/30/2014 12:12 PM   Modules accepted: Orders, Medications

## 2014-05-04 ENCOUNTER — Encounter: Payer: Self-pay | Admitting: Physician Assistant

## 2014-05-04 ENCOUNTER — Telehealth: Payer: Self-pay | Admitting: *Deleted

## 2014-05-04 NOTE — Telephone Encounter (Signed)
pt notified about echo results and EF 25-30% where as previously normal, Per Dr. Caryl Comes and Brynda Rim. PA to try to control BP and we will see her at her f/u 2/1 w/Scott W. PA. Pt verbalized understanding

## 2014-05-05 ENCOUNTER — Encounter: Payer: Self-pay | Admitting: Internal Medicine

## 2014-06-08 ENCOUNTER — Encounter: Payer: Self-pay | Admitting: Physician Assistant

## 2014-06-08 ENCOUNTER — Ambulatory Visit (INDEPENDENT_AMBULATORY_CARE_PROVIDER_SITE_OTHER): Payer: Commercial Managed Care - HMO | Admitting: Physician Assistant

## 2014-06-08 VITALS — BP 120/78 | HR 53 | Ht 59.0 in | Wt 155.0 lb

## 2014-06-08 DIAGNOSIS — I10 Essential (primary) hypertension: Secondary | ICD-10-CM

## 2014-06-08 DIAGNOSIS — I428 Other cardiomyopathies: Secondary | ICD-10-CM

## 2014-06-08 DIAGNOSIS — E785 Hyperlipidemia, unspecified: Secondary | ICD-10-CM

## 2014-06-08 DIAGNOSIS — I5022 Chronic systolic (congestive) heart failure: Secondary | ICD-10-CM

## 2014-06-08 DIAGNOSIS — I429 Cardiomyopathy, unspecified: Secondary | ICD-10-CM

## 2014-06-08 DIAGNOSIS — Z95810 Presence of automatic (implantable) cardiac defibrillator: Secondary | ICD-10-CM

## 2014-06-08 MED ORDER — ISOSORB DINITRATE-HYDRALAZINE 20-37.5 MG PO TABS: 1.5000 | ORAL_TABLET | Freq: Two times a day (BID) | ORAL | Status: DC

## 2014-06-08 NOTE — Progress Notes (Signed)
Cardiology Office Note   Date:  06/08/2014   ID:  Morgan Clay, DOB Sep 03, 1945, MRN [id number]  PCP:  [name]., DO  Cardiologist/Electrophysiologist:  Dr. Virl Axe   Chief Complaint  Patient presents with  . Hypertension  . Congestive Heart Failure  . Cardiomyopathy     History of Present Illness: Morgan Clay is a 69 y.o. female who presents for FU on the above.   She has a history of nonischemic cardiomyopathy, systolic CHF, status post CRT-D, LBBB, hypothyroidism. Most recent echocardiogram in 08/2013 has demonstrated normal LV function.    I saw her in December. She was volume overloaded. Follow-up stress testing demonstrated no perfusion defects. However, her EF was reduced. Echocardiogram confirmed her ejection fraction was back down to 25-30%.  She saw Dr. Caryl Comes in follow-up 04/30/14. Her fluid status was much improved upon interrogation of her device. Her blood pressure is elevated. Her hydralazine/nitrates were adjusted. She returns for follow-up.  Since last seen, she has been doing well. She denies chest discomfort or significant dyspnea. She denies orthopnea, PND or edema. She denies syncope. She is NYHA 2-2b.   Studies Reviewed:  - LHC (4/06): Normal coronary arteries, EF 20-25%  - Nuclear Stress Test (12/15):  No perfusion defects, EF 33%; high risk b/c of reduced EF - Echo (4/15): Severe LVH, EF 0000000, grade 2 diastolic dysfunction, mild AI, mild LAE, mild RVE, normal RV function, moderate TR, PASP 45 mmHg  - Echo (12/15):   Severe LVH , EF 25-30%, diffuse HK , decreased LV diastolic compliance and/or increased LA pressure, mild AI, mild MR, moderate LAE, mild RVE, mild RAE, moderate-severe TR, mildly increased PASP   Past Medical History  Diagnosis Date  . LBBB (left bundle branch block)   . COPD (chronic obstructive pulmonary disease)   . Nonischemic cardiomyopathy     Followed by [facility]  . Biventricular implantable cardiac  defibrillator in situ 12/24/2006    Protecta- Medtronic  ICD gen change 2012  . Chronic systolic congestive heart failure   . Hypothyroidism   . Personal history of other infectious and parasitic disease     Hepatitis B  . FIBROIDS, UTERUS 03/05/2008    Qualifier: Diagnosis of  By: Isla Pence    . OBSTRUCTIVE SLEEP APNEA 11/14/2007    npsg 2009:  Mild osa with AHI 11/hr. Started cpap 2009, quit using due to lack of response Home sleep testing 09/2010 with AHI only 7/hr and weight loss advised by pulmonology.  . OSTEOPENIA 09/30/2008    Qualifier: Diagnosis of  By: Hassell Done FNP, Tori Milks    . OBESITY 05/27/2009    Qualifier: Diagnosis of  By: Hassell Done FNP, Tori Milks    . DEPRESSION 12/17/2009    Annotation: PHQ-9 score = 14 done on 12/17/2009 Qualifier: Diagnosis of  By: Hassell Done FNP, Tori Milks    . Hyperlipemia 12/06/2012  . HYPERTENSION, BENIGN 04/24/2007    Qualifier: Diagnosis of  By: Hassell Done FNP, Tori Milks    . Arthritis   . Glaucoma   . HEPATITIS C - s/p treatment with Harvoni, seeing hepatology, Dawn Drazek  01/04/2007    Qualifier: Diagnosis of  By: Hassell Done FNP, Tori Milks    . Hx of cardiovascular stress test     Lexiscan Myoview (12/15): No ischemia, EF 33%, high risk due to depressed LV function   . Hx of echocardiogram     Echo (12/15): Severe LVH, EF 25-30%, diffuse HK, restrictive physiology, mild AI, mild MR, moderate LAE, mild RVE, mild  RAE, moderate to severe TR, mild increased PASP    Past Surgical History  Procedure Laterality Date  . Appendectomy    . Cardiac catheterization    . [id number]      Concerto 0000000  . Insert / replace / remove pacemaker      concerto     Current Outpatient Prescriptions  Medication Sig Dispense Refill  . albuterol (PROVENTIL HFA;VENTOLIN HFA) 108 (90 BASE) MCG/ACT inhaler Inhale 2 puffs into the lungs every 6 (six) hours as needed. 1 Inhaler 0  . aspirin 81 MG tablet Take 81 mg by mouth daily.      [name] atorvastatin (LIPITOR) 40 MG tablet Take 1  tablet (40 mg total) by mouth daily. 90 tablet 3  . carvedilol (COREG) 25 MG tablet Take 1 tablet (25 mg total) by mouth 2 (two) times daily with a meal. 180 tablet 3  . furosemide (LASIX) 40 MG tablet Take 1 tablet (40 mg total) by mouth 2 (two) times daily.    . isosorbide-hydrALAZINE (BIDIL) 20-37.5 MG per tablet Take 1.5 tablets by mouth 2 (two) times daily. 120 tablet 6  . levothyroxine (SYNTHROID, LEVOTHROID) 150 MCG tablet Take 1 tablet (150 mcg total) by mouth daily. 30 tablet 2  . lisinopril (PRINIVIL,ZESTRIL) 20 MG tablet Take 1 tablet (20 mg total) by mouth 2 (two) times daily. 180 tablet 3  . nitroGLYCERIN (NITROSTAT) 0.4 MG SL tablet Place 1 tablet (0.4 mg total) under the tongue every 5 (five) minutes as needed. 25 tablet 6  . omeprazole (PRILOSEC) 20 MG capsule Take 20 mg by mouth daily.    . Potassium Chloride ER 20 MEQ TBCR Day ONE take 2 tabs twice that day; Starting as of day TWO you will start on 1 tab twice daily 70 tablet 6  . sertraline (ZOLOFT) 100 MG tablet TAKE 1 TABLET EVERY DAY 90 tablet 1  . spironolactone (ALDACTONE) 25 MG tablet Take 1 tablet (25 mg total) by mouth daily. 90 tablet 3  . traZODone (DESYREL) 50 MG tablet TAKE 1 TABLET AT BEDTIME AS NEEDED 90 tablet 1   No current facility-administered medications for this visit.    Allergies:   Oxycodone-acetaminophen    Social History:  The patient  reports that she has never smoked. She has never used smokeless tobacco. She reports that she drinks alcohol.   Family History:  The patient's family history includes Asthma in her father and sister; Cancer in her sister; Diabetes in an other family member; Heart attack in her father and mother; Stroke in her brother.    ROS:  Please see the history of present illness.   Otherwise, review of systems are positive for none.   All other systems are reviewed and negative.    PHYSICAL EXAM: VS:  BP 120/78 mmHg  Pulse 53  Ht 4\' 11"  (1.499 m)  Wt 155 lb (70.308 kg)   BMI 31.29 kg/m2  SpO2 99%    Wt Readings from Last 3 Encounters:  06/08/14 155 lb (70.308 kg)  04/30/14 153 lb 9.6 oz (69.673 kg)  04/29/14 152 lb 6.4 oz (69.128 kg)     GEN: Well nourished, well developed, in no acute distress HEENT: normal Neck: no JVD, no masses Cardiac:  Normal S1/S2, RRR; no murmur, no rubs or gallops, no edema  Respiratory:  clear to auscultation bilaterally, no wheezing, rhonchi or rales. GI: soft, nontender, nondistended, + BS MS: no deformity or atrophy Skin: warm and dry  Neuro:  CNs II-XII intact,  Strength and sensation are intact Psych: Normal affect   EKG:  EKG is not ordered today.  It demonstrates:   n/a   Recent Labs: 04/15/2014: Hemoglobin 13.7; Platelets 152.0 04/22/2014: Pro B Natriuretic peptide (BNP) 703.0* 04/29/2014: TSH 6.03* 04/30/2014: BUN 21; Creatinine 1.1; Potassium 4.3; Sodium 135    Lipid Panel    Component Value Date/Time   CHOL 173 11/24/2013 1345   TRIG 94.0 11/24/2013 1345   HDL 69.40 11/24/2013 1345   CHOLHDL 2 11/24/2013 1345   VLDL 18.8 11/24/2013 1345   LDLCALC 85 11/24/2013 1345      ASSESSMENT AND PLAN:  1.  Chronic Systolic CHF:    Volume is stable. She is doing well on a combination of Lasix 40 mg twice a day, spironolactone 25 mg daily and potassium supplementation.   Continue current therapy. 2.  Nonischemic Cardiomyopathy:   Etiology of reduction in her ejection fraction is not entirely clear. She is adequately treated with a combination of carvedilol, hydralazine/isosorbide dinitrate, lisinopril, spironolactone, furosemide and potassium supplementation.   Consider repeating echocardiogram at some point in the next 3 months. 3.  Hypertension:  Well controlled. She is tolerating her current regimen of carvedilol, hydralazine/isosorbide dinitrate, lisinopril, spironolactone. 4.  Hyperlipidemia:  Recent LDL okay on her current dose of Lipitor 40 mg daily. 5.  S/p CRT-D:  Follow up with EP as  planned.   Current medicines are reviewed at length with the patient today.  The patient does not have concerns regarding medicines.  The following changes have been made:  no change  Labs/ tests ordered today include:  No orders of the defined types were placed in this encounter.     Disposition:   FU with me in 3 months and Dr. Virl Axe in 6 mos.   Signed, Versie Starks, MHS 06/08/2014 5:11 PM    [facility] [address], [place], [place]  [address] Phone: [phone]; Fax: [phone]

## 2014-06-08 NOTE — Patient Instructions (Signed)
FOLLOW UP WITH SCOTT WEAVER, PAC IN 3 MONTHS  Your physician wants you to follow-up in: [place] DR. Gari Crown will receive a reminder letter in the mail two months in advance. If you don't receive a letter, please call our office to schedule the follow-up appointment.   Your physician recommends that you continue on your current medications as directed. Please refer to the Current Medication list given to you today.

## 2014-06-11 ENCOUNTER — Other Ambulatory Visit (INDEPENDENT_AMBULATORY_CARE_PROVIDER_SITE_OTHER): Payer: Commercial Managed Care - HMO

## 2014-06-11 DIAGNOSIS — E038 Other specified hypothyroidism: Secondary | ICD-10-CM

## 2014-06-11 LAB — TSH: TSH: 3.21 u[IU]/mL (ref 0.35–4.50)

## 2014-06-22 ENCOUNTER — Encounter: Payer: Self-pay | Admitting: Physician Assistant

## 2014-06-30 ENCOUNTER — Other Ambulatory Visit: Payer: Self-pay | Admitting: *Deleted

## 2014-06-30 MED ORDER — TRAZODONE HCL 50 MG PO TABS: 50.0000 mg | ORAL_TABLET | Freq: Every evening | ORAL | Status: DC | PRN

## 2014-06-30 MED ORDER — SERTRALINE HCL 100 MG PO TABS: 100.0000 mg | ORAL_TABLET | Freq: Every day | ORAL | Status: DC

## 2014-06-30 NOTE — Telephone Encounter (Signed)
Rxs done. 

## 2014-07-22 ENCOUNTER — Ambulatory Visit (INDEPENDENT_AMBULATORY_CARE_PROVIDER_SITE_OTHER): Payer: Commercial Managed Care - HMO | Admitting: Family Medicine

## 2014-07-22 ENCOUNTER — Encounter: Payer: Self-pay | Admitting: Family Medicine

## 2014-07-22 VITALS — BP 136/76 | HR 78 | Temp 98.4°F | Wt 150.0 lb

## 2014-07-22 DIAGNOSIS — J209 Acute bronchitis, unspecified: Secondary | ICD-10-CM

## 2014-07-22 DIAGNOSIS — R05 Cough: Secondary | ICD-10-CM

## 2014-07-22 DIAGNOSIS — R062 Wheezing: Secondary | ICD-10-CM

## 2014-07-22 DIAGNOSIS — R51 Headache: Secondary | ICD-10-CM | POA: Diagnosis not present

## 2014-07-22 DIAGNOSIS — R0981 Nasal congestion: Secondary | ICD-10-CM | POA: Diagnosis not present

## 2014-07-22 MED ORDER — METHYLPREDNISOLONE ACETATE 80 MG/ML IJ SUSP
80.0000 mg | Freq: Once | INTRAMUSCULAR | Status: AC
  Administered 2014-07-22: 80 mg via INTRAMUSCULAR

## 2014-07-22 MED ORDER — ALBUTEROL SULFATE (2.5 MG/3ML) 0.083% IN NEBU
2.5000 mg | INHALATION_SOLUTION | RESPIRATORY_TRACT | Status: AC
  Administered 2014-07-22: 2.5 mg via RESPIRATORY_TRACT

## 2014-07-22 MED ORDER — DOXYCYCLINE HYCLATE 100 MG PO CAPS: 100.0000 mg | ORAL_CAPSULE | Freq: Two times a day (BID) | ORAL | Status: DC

## 2014-07-22 NOTE — Progress Notes (Signed)
Subjective:    Patient ID: Morgan Clay, female    DOB: Nov 29, 1945, 69 y.o.   MRN: [id number]  HPI Patient seen for acute visit. She is seen with five-day history of cough and some wheezing. Cough has been especially bad at night. She started about 5 days ago with typical viral symptoms of cough, nasal congestion, rhinorrhea, mild headache. Mild chills. No documented fever. No nausea or vomiting. Cough occasionally productive of yellow sputum. She has reported history of COPD on her problem list but states she has never smoked. There is no comment of COPD changes on most recent chest x-ray.  Her chronic problems include history of hypothyroidism, nonischemic cardiomyopathy, hypertension, hepatitis C. She has biventricular ICD.  Past Medical History  Diagnosis Date  . LBBB (left bundle branch block)   . COPD (chronic obstructive pulmonary disease)   . Nonischemic cardiomyopathy     Followed by [facility]  . Biventricular implantable cardiac defibrillator in situ 12/24/2006    Protecta- Medtronic  ICD gen change 2012  . Chronic systolic congestive heart failure   . Hypothyroidism   . Personal history of other infectious and parasitic disease     Hepatitis B  . FIBROIDS, UTERUS 03/05/2008    Qualifier: Diagnosis of  By: Isla Pence    . OBSTRUCTIVE SLEEP APNEA 11/14/2007    npsg 2009:  Mild osa with AHI 11/hr. Started cpap 2009, quit using due to lack of response Home sleep testing 09/2010 with AHI only 7/hr and weight loss advised by pulmonology.  . OSTEOPENIA 09/30/2008    Qualifier: Diagnosis of  By: Hassell Done FNP, Tori Milks    . OBESITY 05/27/2009    Qualifier: Diagnosis of  By: Hassell Done FNP, Tori Milks    . DEPRESSION 12/17/2009    Annotation: PHQ-9 score = 14 done on 12/17/2009 Qualifier: Diagnosis of  By: Hassell Done FNP, Tori Milks    . Hyperlipemia 12/06/2012  . HYPERTENSION, BENIGN 04/24/2007    Qualifier: Diagnosis of  By: Hassell Done FNP, Tori Milks    . Arthritis   . Glaucoma   .  HEPATITIS C - s/p treatment with Harvoni, seeing hepatology, Dawn Drazek  01/04/2007    Qualifier: Diagnosis of  By: Hassell Done FNP, Tori Milks    . Hx of cardiovascular stress test     Lexiscan Myoview (12/15): No ischemia, EF 33%, high risk due to depressed LV function   . Hx of echocardiogram     Echo (12/15): Severe LVH, EF 25-30%, diffuse HK, restrictive physiology, mild AI, mild MR, moderate LAE, mild RVE, mild RAE, moderate to severe TR, mild increased PASP   Past Surgical History  Procedure Laterality Date  . Appendectomy    . Cardiac catheterization    . 123456      Concerto 0000000  . Insert / replace / remove pacemaker      concerto    reports that she has never smoked. She has never used smokeless tobacco. She reports that she drinks alcohol. Her drug history is not on file. family history includes Asthma in her father and sister; Cancer in her sister; Diabetes in an other family member; Heart attack in her father and mother; Stroke in her brother. Allergies  Allergen Reactions  . Oxycodone-Acetaminophen     Percocet     Review of Systems  Constitutional: Positive for chills and fatigue. Negative for fever.  HENT: Positive for congestion.   Respiratory: Positive for cough, shortness of breath and wheezing.   Cardiovascular: Negative for chest pain and leg swelling.  Gastrointestinal: Negative for abdominal pain.  Genitourinary: Negative for dysuria.  Neurological: Positive for headaches.       Objective:   Physical Exam  Constitutional: She appears well-developed and well-nourished.  HENT:  Right Ear: External ear normal.  Left Ear: External ear normal.  Mouth/Throat: Oropharynx is clear and moist.  Neck: Neck supple. No JVD present.  Cardiovascular: Normal rate.   Pulmonary/Chest: She has wheezes.  She has diffuse wheezes. No rales. No retractions.  Musculoskeletal: She exhibits no edema.          Assessment & Plan:  Patient presents with probable viral  syndrome. She has some definite reactive airway changes on exam. Albuterol nebulizer given in office. Depo-Medrol 80 mg IM. Doxycycline 100 mg twice daily for 10 days. Continue home albuterol inhaler as needed.  Follow-up promptly for fever or increased shortness of breath

## 2014-07-22 NOTE — Patient Instructions (Signed)
Bronchospasm A bronchospasm is when the tubes that carry air in and out of your lungs (airways) spasm or tighten. During a bronchospasm it is hard to breathe. This is because the airways get smaller. A bronchospasm can be triggered by:  Allergies. These may be to animals, pollen, food, or mold.  Infection. This is a common cause of bronchospasm.  Exercise.  Irritants. These include pollution, cigarette smoke, strong odors, aerosol sprays, and paint fumes.  Weather changes.  Stress.  Being emotional. HOME CARE   Always have a plan for getting help. Know when to call your doctor and local emergency services (911 in the [place] Know where you can get emergency care.  Only take medicines as told by your doctor.  If you were prescribed an inhaler or nebulizer machine, ask your doctor how to use it correctly. Always use a spacer with your inhaler if you were given one.  Stay calm during an attack. Try to relax and breathe more slowly.  Control your home environment:  Change your heating and air conditioning filter at least once a month.  Limit your use of fireplaces and wood stoves.  Do not  smoke. Do not  allow smoking in your home.  Avoid perfumes and fragrances.  Get rid of pests (such as roaches and mice) and their droppings.  Throw away plants if you see mold on them.  Keep your house clean and dust free.  Replace carpet with wood, tile, or vinyl flooring. Carpet can trap dander and dust.  Use allergy-proof pillows, mattress covers, and box spring covers.  Wash bed sheets and blankets every week in hot water. Dry them in a dryer.  Use blankets that are made of polyester or cotton.  Wash hands frequently. GET HELP IF:  You have muscle aches.  You have chest pain.  The thick spit you spit or cough up (sputum) changes from clear or white to yellow, green, gray, or bloody.  The thick spit you spit or cough up gets thicker.  There are problems that may be related  to the medicine you are given such as:  A rash.  Itching.  Swelling.  Trouble breathing. GET HELP RIGHT AWAY IF:  You feel you cannot breathe or catch your breath.  You cannot stop coughing.  Your treatment is not helping you breathe better.  You have very bad chest pain. MAKE SURE YOU:   Understand these instructions.  Will watch your condition.  Will get help right away if you are not doing well or get worse. Document Released: 02/19/2009 Document Revised: 04/29/2013 Document Reviewed: 10/15/2012 [facility] Patient Information 2015 [place], [place]. This information is not intended to replace advice given to you by your health care provider. Make sure you discuss any questions you have with your health care provider.  Continue with your albuterol inhaler 2 puffs every 4 hours as needed for cough and wheezing. Follow up for fever or increased shortness of breath.

## 2014-07-22 NOTE — Progress Notes (Signed)
Pre visit review using our clinic review tool, if applicable. No additional management support is needed unless otherwise documented below in the visit note. 

## 2014-07-23 ENCOUNTER — Ambulatory Visit: Payer: Commercial Managed Care - HMO | Admitting: Family Medicine

## 2014-07-30 ENCOUNTER — Ambulatory Visit (INDEPENDENT_AMBULATORY_CARE_PROVIDER_SITE_OTHER): Payer: Commercial Managed Care - HMO | Admitting: *Deleted

## 2014-07-30 ENCOUNTER — Ambulatory Visit: Payer: Commercial Managed Care - HMO | Admitting: Family Medicine

## 2014-07-30 DIAGNOSIS — I429 Cardiomyopathy, unspecified: Secondary | ICD-10-CM | POA: Diagnosis not present

## 2014-07-30 DIAGNOSIS — I5022 Chronic systolic (congestive) heart failure: Secondary | ICD-10-CM

## 2014-07-30 DIAGNOSIS — I428 Other cardiomyopathies: Secondary | ICD-10-CM

## 2014-07-30 LAB — MDC_IDC_ENUM_SESS_TYPE_REMOTE
Brady Statistic AP VS Percent: 0.01 %
Brady Statistic AS VS Percent: 0.11 %
Brady Statistic RA Percent Paced: 0.21 %
Brady Statistic RV Percent Paced: 99.88 %
HighPow Impedance: 342 Ohm
HighPow Impedance: 45 Ohm
HighPow Impedance: 65 Ohm
Lead Channel Impedance Value: 4047 Ohm
Lead Channel Impedance Value: 418 Ohm
Lead Channel Pacing Threshold Amplitude: 0.75 V
Lead Channel Pacing Threshold Amplitude: 0.875 V
Lead Channel Pacing Threshold Amplitude: 0.875 V
Lead Channel Pacing Threshold Pulse Width: 0.4 ms
Lead Channel Pacing Threshold Pulse Width: 0.4 ms
Lead Channel Sensing Intrinsic Amplitude: 15.625 mV
Lead Channel Sensing Intrinsic Amplitude: 15.625 mV
Lead Channel Sensing Intrinsic Amplitude: 3.375 mV
Lead Channel Setting Pacing Amplitude: 2 V
Lead Channel Setting Pacing Amplitude: 2 V
Lead Channel Setting Pacing Amplitude: 2.5 V
Lead Channel Setting Pacing Pulse Width: 0.4 ms
Lead Channel Setting Sensing Sensitivity: 0.3 mV
MDC IDC MSMT BATTERY VOLTAGE: 2.91 V
MDC IDC MSMT LEADCHNL LV IMPEDANCE VALUE: 4047 Ohm
MDC IDC MSMT LEADCHNL RA IMPEDANCE VALUE: 475 Ohm
MDC IDC MSMT LEADCHNL RA SENSING INTR AMPL: 3.375 mV
MDC IDC MSMT LEADCHNL RV IMPEDANCE VALUE: 418 Ohm
MDC IDC MSMT LEADCHNL RV PACING THRESHOLD PULSEWIDTH: 0.4 ms
MDC IDC SESS DTM: 20160324121421
MDC IDC SET LEADCHNL LV PACING PULSEWIDTH: 0.4 ms
MDC IDC SET ZONE DETECTION INTERVAL: 280 ms
MDC IDC SET ZONE DETECTION INTERVAL: 330 ms
MDC IDC SET ZONE DETECTION INTERVAL: 350 ms
MDC IDC STAT BRADY AP VP PERCENT: 0.2 %
MDC IDC STAT BRADY AS VP PERCENT: 99.68 %
Zone Setting Detection Interval: 400 ms

## 2014-07-30 NOTE — Progress Notes (Signed)
Remote ICD transmission.   

## 2014-08-04 ENCOUNTER — Telehealth: Payer: Self-pay | Admitting: *Deleted

## 2014-08-04 NOTE — Telephone Encounter (Signed)
LMTCB/SSS 

## 2014-08-06 ENCOUNTER — Other Ambulatory Visit: Payer: Self-pay | Admitting: Family Medicine

## 2014-08-06 DIAGNOSIS — Z1231 Encounter for screening mammogram for malignant neoplasm of breast: Secondary | ICD-10-CM

## 2014-08-07 NOTE — Telephone Encounter (Signed)
Spoke to patient about abn Optivol seen on 3/24 remote. Patient denies any LE edema or wt gain. Patient states that she was seen by [facility] in [place] and was dx with bronchitis--given antibiotics and an injection. Patient states that she is still having some occasional "problems with allergies", but fells well overall. Information to be relayed to SK. Report placed in red folder. Will call pt if any changes needed. Patient voiced understanding.

## 2014-08-11 ENCOUNTER — Ambulatory Visit (HOSPITAL_COMMUNITY)
Admission: RE | Admit: 2014-08-11 | Discharge: 2014-08-11 | Disposition: A | Discharge status: Home / Self Care | Payer: Commercial Managed Care - HMO | Source: Ambulatory Visit | Attending: Family Medicine | Admitting: Family Medicine

## 2014-08-11 DIAGNOSIS — Z1231 Encounter for screening mammogram for malignant neoplasm of breast: Secondary | ICD-10-CM | POA: Insufficient documentation

## 2014-08-12 ENCOUNTER — Other Ambulatory Visit: Payer: Self-pay | Admitting: Family Medicine

## 2014-08-12 DIAGNOSIS — R928 Other abnormal and inconclusive findings on diagnostic imaging of breast: Secondary | ICD-10-CM

## 2014-08-19 ENCOUNTER — Encounter: Payer: Self-pay | Admitting: Internal Medicine

## 2014-08-24 ENCOUNTER — Other Ambulatory Visit: Payer: Self-pay | Admitting: Family Medicine

## 2014-08-24 ENCOUNTER — Ambulatory Visit
Admission: RE | Admit: 2014-08-24 | Discharge: 2014-08-24 | Disposition: A | Discharge status: Home / Self Care | Payer: Commercial Managed Care - HMO | Source: Ambulatory Visit | Attending: Family Medicine | Admitting: Family Medicine

## 2014-08-24 DIAGNOSIS — R921 Mammographic calcification found on diagnostic imaging of breast: Secondary | ICD-10-CM

## 2014-08-24 DIAGNOSIS — R928 Other abnormal and inconclusive findings on diagnostic imaging of breast: Secondary | ICD-10-CM

## 2014-08-26 ENCOUNTER — Ambulatory Visit (INDEPENDENT_AMBULATORY_CARE_PROVIDER_SITE_OTHER): Payer: Commercial Managed Care - HMO | Admitting: Family Medicine

## 2014-08-26 ENCOUNTER — Encounter: Payer: Self-pay | Admitting: Family Medicine

## 2014-08-26 VITALS — BP 150/70 | HR 69 | Temp 97.7°F | Ht 59.0 in | Wt 153.2 lb

## 2014-08-26 DIAGNOSIS — E785 Hyperlipidemia, unspecified: Secondary | ICD-10-CM

## 2014-08-26 DIAGNOSIS — N189 Chronic kidney disease, unspecified: Secondary | ICD-10-CM

## 2014-08-26 DIAGNOSIS — I429 Cardiomyopathy, unspecified: Secondary | ICD-10-CM

## 2014-08-26 DIAGNOSIS — F32A Depression, unspecified: Secondary | ICD-10-CM

## 2014-08-26 DIAGNOSIS — E038 Other specified hypothyroidism: Secondary | ICD-10-CM

## 2014-08-26 DIAGNOSIS — N058 Unspecified nephritic syndrome with other morphologic changes: Secondary | ICD-10-CM | POA: Diagnosis not present

## 2014-08-26 DIAGNOSIS — B1920 Unspecified viral hepatitis C without hepatic coma: Secondary | ICD-10-CM

## 2014-08-26 DIAGNOSIS — Z23 Encounter for immunization: Secondary | ICD-10-CM

## 2014-08-26 DIAGNOSIS — E669 Obesity, unspecified: Secondary | ICD-10-CM

## 2014-08-26 DIAGNOSIS — I10 Essential (primary) hypertension: Secondary | ICD-10-CM | POA: Diagnosis not present

## 2014-08-26 DIAGNOSIS — I5022 Chronic systolic (congestive) heart failure: Secondary | ICD-10-CM

## 2014-08-26 DIAGNOSIS — E1129 Type 2 diabetes mellitus with other diabetic kidney complication: Secondary | ICD-10-CM

## 2014-08-26 DIAGNOSIS — F329 Major depressive disorder, single episode, unspecified: Secondary | ICD-10-CM

## 2014-08-26 DIAGNOSIS — I428 Other cardiomyopathies: Secondary | ICD-10-CM

## 2014-08-26 LAB — LIPID PANEL
CHOLESTEROL: 161 mg/dL (ref 0–200)
HDL: 64.8 mg/dL (ref >39.00)
LDL CALC: 76 mg/dL (ref 0–99)
NonHDL: 96.2
TRIGLYCERIDES: 103 mg/dL (ref 0.0–149.0)
Total CHOL/HDL Ratio: 2
VLDL: 20.6 mg/dL (ref 0.0–40.0)

## 2014-08-26 LAB — BASIC METABOLIC PANEL
BUN: 19 mg/dL (ref 6–23)
CO2: 28 mEq/L (ref 19–32)
Calcium: 10 mg/dL (ref 8.4–10.5)
Chloride: 106 mEq/L (ref 96–112)
Creatinine, Ser: 1.18 mg/dL (ref 0.40–1.20)
GFR: 58.83 mL/min — AB (ref >60.00)
Glucose, Bld: 82 mg/dL (ref 70–99)
POTASSIUM: 4.1 meq/L (ref 3.5–5.1)
Sodium: 139 mEq/L (ref 135–145)

## 2014-08-26 LAB — HEMOGLOBIN A1C: Hgb A1c MFr Bld: 6.2 % (ref 4.6–6.5)

## 2014-08-26 NOTE — Progress Notes (Signed)
HPI:  GERD: -on PPI prn -reports: this has improved on the PPI -denies: fevers, blood in stools, innabiity to tol po intake, fevers, abd pain, CP, SOB  Asthma/Allergies: -she had reported and listed PMH COPD when established with me but now says not COPR but actually has had asthma since she was a child and used alb prn with allergies and URI -she has no smoking hx - tends to get bronchitis with VURI -she reports nasal symptoms, sneezing, itchy nose and eyes in the spring -meds:alb prn  Depression:  -takes zoloft 100mg  and trazadone 50mg  prn at bedtime -stable - this is "getting better" -denies: thoughts of self harm, SI   Diabetes:  -well controlled, mild, diet controlled -line dancing 2x per week -diet is ok  -home BS: does not check  -last eye exam: UTD -denies: polyuria, polydipsia, vision changes, foot lesions   Hypothyroid:  -on synthroid 137 mcg  -takes synthroid daily on empty stomach  -denies: palpitation, constipation, skin changes   Hep C:  -followed and managed by hepatologist  -s/p harvoni in the past with cure per review recent labs but with sig fibrosis and scarring -per notes getting serial USs and ok 02/2014  Cardiomyopathy, CsCHF, s/p CRT-D,HLD, HTN:  -managed by her cardiologist -meds: asa 81mg , lipitor 40mg , coreg 25 mg bid, lasix 40mg  bid currently, bidil (isosorbide-hydralazine) 20-37.5 bid, lisinopril 20mg  daily, spironolactone 25 mg daily -most recent notes reviewed, appreciate care  -denies: CP, SOB, palpitation, swelling   Renal Insufficiency/Proteinuria:  -seeing nephrologist for this - recent notes from [place] reviewed - stopping hep C tx and monitoring labs -she had protein in the urine and was advised to stop mobic -reports she saw her kidney specialist recently  ROS: See pertinent positives and negatives per HPI.  Past Medical History  Diagnosis Date  . LBBB (left bundle branch block)   .  Nonischemic cardiomyopathy     Followed by [facility]  . Biventricular implantable cardiac defibrillator in situ 12/24/2006    Protecta- Medtronic  ICD gen change 2012  . Chronic systolic congestive heart failure   . Hypothyroidism   . Personal history of other infectious and parasitic disease     Hepatitis B  . FIBROIDS, UTERUS 03/05/2008    Qualifier: Diagnosis of  By: Isla Pence    . OBSTRUCTIVE SLEEP APNEA 11/14/2007    npsg 2009:  Mild osa with AHI 11/hr. Started cpap 2009, quit using due to lack of response Home sleep testing 09/2010 with AHI only 7/hr and weight loss advised by pulmonology.  . OSTEOPENIA 09/30/2008    Qualifier: Diagnosis of  By: Hassell Done FNP, Tori Milks    . OBESITY 05/27/2009    Qualifier: Diagnosis of  By: Hassell Done FNP, Tori Milks    . DEPRESSION 12/17/2009    Annotation: PHQ-9 score = 14 done on 12/17/2009 Qualifier: Diagnosis of  By: Hassell Done FNP, Tori Milks    . Hyperlipemia 12/06/2012  . HYPERTENSION, BENIGN 04/24/2007    Qualifier: Diagnosis of  By: Hassell Done FNP, Tori Milks    . Arthritis   . Glaucoma   . HEPATITIS C - s/p treatment with Harvoni, seeing hepatology, Dawn Drazek  01/04/2007    Qualifier: Diagnosis of  By: Hassell Done FNP, Tori Milks    . Hx of cardiovascular stress test     Lexiscan Myoview (12/15): No ischemia, EF 33%, high risk due to depressed LV function   . Hx of echocardiogram     Echo (12/15): Severe LVH, EF 25-30%, diffuse HK, restrictive  physiology, mild AI, mild MR, moderate LAE, mild RVE, mild RAE, moderate to severe TR, mild increased PASP  . Asthma     reports mild asthma since childhood - had COPD on dx list from prior PCP    Past Surgical History  Procedure Laterality Date  . Appendectomy    . Cardiac catheterization    . [id number]      Concerto 0000000  . Insert / replace / remove pacemaker      concerto    Family History  Problem Relation Age of Onset  . Asthma Father   . Asthma Sister   . Cancer Sister     lung  . Diabetes    .  Heart attack Mother   . Heart attack Father   . Stroke Brother     History   Social History  . Marital Status: Single    Spouse Name: N/A  . Number of Children: N/A  . Years of Education: N/A   Social History Main Topics  . Smoking status: Never Smoker   . Smokeless tobacco: Never Used  . Alcohol Use: Yes     Comment: 3 glasses of wine 3-4 times per week  . Drug Use: Not on file  . Sexual Activity: Not on file   Other Topics Concern  . None   Social History Narrative   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      Home Situation: lives alone      Spiritual Beliefs: [place]      Lifestyle: no regular exercise; poor diet              Current outpatient prescriptions:  .  albuterol (PROVENTIL HFA;VENTOLIN HFA) 108 (90 BASE) MCG/ACT inhaler, Inhale 2 puffs into the lungs every 6 (six) hours as needed., Disp: 1 Inhaler, Rfl: 0 .  aspirin 81 MG tablet, Take 81 mg by mouth daily.  , Disp: , Rfl:  .  atorvastatin (LIPITOR) 40 MG tablet, Take 1 tablet (40 mg total) by mouth daily., Disp: 90 tablet, Rfl: 3 .  carvedilol (COREG) 25 MG tablet, Take 1 tablet (25 mg total) by mouth 2 (two) times daily with a meal., Disp: 180 tablet, Rfl: 3 .  furosemide (LASIX) 40 MG tablet, Take 1 tablet (40 mg total) by mouth 2 (two) times daily., Disp: , Rfl:  .  isosorbide-hydrALAZINE (BIDIL) 20-37.5 MG per tablet, Take 1.5 tablets by mouth 2 (two) times daily., Disp: 120 tablet, Rfl: 6 .  levothyroxine (SYNTHROID, LEVOTHROID) 150 MCG tablet, Take 1 tablet (150 mcg total) by mouth daily., Disp: 30 tablet, Rfl: 2 .  lisinopril (PRINIVIL,ZESTRIL) 20 MG tablet, Take 1 tablet (20 mg total) by mouth 2 (two) times daily., Disp: 180 tablet, Rfl: 3 .  nitroGLYCERIN (NITROSTAT) 0.4 MG SL tablet, Place 1 tablet (0.4 mg total) under the tongue every 5 (five) minutes as needed., Disp: 25 tablet, Rfl: 6 .  omeprazole (PRILOSEC) 20 MG capsule, Take 20 mg by mouth daily., Disp: , Rfl:  .   Potassium Chloride ER 20 MEQ TBCR, Day ONE take 2 tabs twice that day; Starting as of day TWO you will start on 1 tab twice daily, Disp: 70 tablet, Rfl: 6 .  sertraline (ZOLOFT) 100 MG tablet, Take 1 tablet (100 mg total) by mouth daily., Disp: 90 tablet, Rfl: 1 .  spironolactone (ALDACTONE) 25 MG tablet, Take 1 tablet (25 mg total) by mouth daily., Disp: 90 tablet, Rfl: 3 .  traZODone (DESYREL) 50  MG tablet, Take 1 tablet (50 mg total) by mouth at bedtime as needed., Disp: 90 tablet, Rfl: 1  EXAM:  Filed Vitals:   08/26/14 1034  BP: 150/70  Pulse: 69  Temp: 97.7 F (36.5 C)    Body mass index is 30.93 kg/(m^2).  GENERAL: vitals reviewed and listed above, alert, oriented, appears well hydrated and in no acute distress  HEENT: atraumatic, conjunttiva clear, no obvious abnormalities on inspection of external nose and ears  NECK: no obvious masses on inspection  LUNGS: clear to auscultation bilaterally, no wheezes, rales or rhonchi, good air movement  CV: HRRR, no peripheral edema  MS: moves all extremities without noticeable abnormality  PSYCH: pleasant and cooperative, no obvious depression or anxiety  ASSESSMENT AND PLAN:  Discussed the following assessment and plan:  Type 2 diabetes, controlled, with renal manifestation - Plan: Hemoglobin A1c  Chronic kidney disease, unspecified stage  Hyperlipemia - Plan: Lipid Panel  Nonischemic cardiomyopathy  Depression  Obesity  HYPERTENSION, BENIGN - Plan: Basic metabolic panel  Chronic systolic heart failure  Other specified hypothyroidism  Hepatitis C virus infection without hepatic coma, unspecified chronicity  -labs today -lifestyle recs -advised she follow up with her hepatologist as she is not sure when she was supposed to follow up -Patient advised to return or notify a doctor immediately if symptoms worsen or persist or new concerns arise.  Patient Instructions  BEFORE YOU LEAVE: -pneumococcal  23 -labs -schedule medicare wellness visit in 3-4 months  Schedule follow up with your liver specialist  During seasonal allergy flares take flonase and claritin daily  We recommend the following healthy lifestyle measures: - eat a healthy diet consisting of lots of vegetables, fruits, beans, nuts, seeds, healthy meats such as white chicken and fish and whole grains.  - avoid fried foods, fast food, processed foods, sodas, red meet and other fattening foods.  - get a least 150 minutes of aerobic exercise per week.   -We have ordered labs or studies at this visit. It can take up to 1-2 weeks for results and processing. We will contact you with instructions IF your results are abnormal. Normal results will be released to your [facility]. If you have not heard from [name] or can not find your results in [facility] in 2 weeks please contact our office.            [name]

## 2014-08-26 NOTE — Progress Notes (Signed)
Pre visit review using our clinic review tool, if applicable. No additional management support is needed unless otherwise documented below in the visit note. 

## 2014-08-26 NOTE — Addendum Note (Signed)
Addended by: Agnes Lawrence on: 08/26/2014 11:48 AM   Modules accepted: Orders

## 2014-08-26 NOTE — Progress Notes (Deleted)
   Subjective:    Patient ID: Morgan Clay, female    DOB: 1945-05-18, 69 y.o.   MRN: [id number]  HPI    Review of Systems     Objective:   Physical Exam        Assessment & Plan:

## 2014-08-26 NOTE — Patient Instructions (Addendum)
BEFORE YOU LEAVE: -pneumococcal 23 -labs -schedule medicare wellness visit in 3-4 months  Schedule follow up with your liver specialist  During seasonal allergy flares take flonase and claritin daily  We recommend the following healthy lifestyle measures: - eat a healthy diet consisting of lots of vegetables, fruits, beans, nuts, seeds, healthy meats such as white chicken and fish and whole grains.  - avoid fried foods, fast food, processed foods, sodas, red meet and other fattening foods.  - get a least 150 minutes of aerobic exercise per week.   -We have ordered labs or studies at this visit. It can take up to 1-2 weeks for results and processing. We will contact you with instructions IF your results are abnormal. Normal results will be released to your [facility]. If you have not heard from [name] or can not find your results in [facility] in 2 weeks please contact our office.

## 2014-08-27 ENCOUNTER — Other Ambulatory Visit: Payer: Self-pay | Admitting: Family Medicine

## 2014-08-27 ENCOUNTER — Telehealth: Payer: Self-pay | Admitting: Family Medicine

## 2014-08-27 DIAGNOSIS — R921 Mammographic calcification found on diagnostic imaging of breast: Secondary | ICD-10-CM

## 2014-08-31 ENCOUNTER — Ambulatory Visit
Admission: RE | Admit: 2014-08-31 | Discharge: 2014-08-31 | Disposition: A | Discharge status: Home / Self Care | Payer: Commercial Managed Care - HMO | Source: Ambulatory Visit | Attending: Family Medicine | Admitting: Family Medicine

## 2014-08-31 DIAGNOSIS — R921 Mammographic calcification found on diagnostic imaging of breast: Secondary | ICD-10-CM

## 2014-09-08 ENCOUNTER — Ambulatory Visit: Payer: Commercial Managed Care - HMO | Admitting: Physician Assistant

## 2014-09-23 NOTE — Progress Notes (Signed)
Cardiology Office Note   Date:  09/24/2014   ID:  Dailey Eade, DOB 03/16/1946, MRN [id number]  PCP:  [name]., DO  Cardiologist/Electrophysiologist:  Dr. Virl Axe   Chief Complaint  Patient presents with  . Congestive Heart Failure     History of Present Illness: Morgan Clay is a 69 y.o. female with a hx of nonischemic cardiomyopathy, systolic CHF, status post CRT-D, LBBB, hypothyroidism. Most recent echocardiogram in 08/2013 has demonstrated normal LV function.    I saw her in December. She was volume overloaded. Follow-up stress testing demonstrated no perfusion defects. However, her EF was reduced. Echocardiogram confirmed her ejection fraction was back down to 25-30%.    Last seen 06/2014.  She is doing well.  The patient denies chest pain, shortness of breath, syncope, orthopnea, PND or significant pedal edema.    Studies Reviewed:  - LHC (4/06): Normal coronary arteries, EF 20-25%  - Nuclear Stress Test (12/15):  No perfusion defects, EF 33%; high risk b/c of reduced EF - Echo (4/15): Severe LVH, EF 0000000, grade 2 diastolic dysfunction, mild AI, mild LAE, mild RVE, normal RV function, moderate TR, PASP 45 mmHg  - Echo (12/15):   Severe LVH , EF 25-30%, diffuse HK , decreased LV diastolic compliance and/or increased LA pressure, mild AI, mild MR, moderate LAE, mild RVE, mild RAE, moderate-severe TR, mildly increased PASP   Past Medical History  Diagnosis Date  . LBBB (left bundle branch block)   . Nonischemic cardiomyopathy     Followed by [facility]  . Biventricular implantable cardiac defibrillator in situ 12/24/2006    Protecta- Medtronic  ICD gen change 2012  . Chronic systolic congestive heart failure   . Hypothyroidism   . Personal history of other infectious and parasitic disease     Hepatitis B  . FIBROIDS, UTERUS 03/05/2008    Qualifier: Diagnosis of  By: Isla Pence    . OBSTRUCTIVE SLEEP APNEA 11/14/2007    npsg 2009:   Mild osa with AHI 11/hr. Started cpap 2009, quit using due to lack of response Home sleep testing 09/2010 with AHI only 7/hr and weight loss advised by pulmonology.  . OSTEOPENIA 09/30/2008    Qualifier: Diagnosis of  By: Hassell Done FNP, Tori Milks    . OBESITY 05/27/2009    Qualifier: Diagnosis of  By: Hassell Done FNP, Tori Milks    . DEPRESSION 12/17/2009    Annotation: PHQ-9 score = 14 done on 12/17/2009 Qualifier: Diagnosis of  By: Hassell Done FNP, Tori Milks    . Hyperlipemia 12/06/2012  . HYPERTENSION, BENIGN 04/24/2007    Qualifier: Diagnosis of  By: Hassell Done FNP, Tori Milks    . Arthritis   . Glaucoma   . HEPATITIS C - s/p treatment with Harvoni, seeing hepatology, Dawn Drazek  01/04/2007    Qualifier: Diagnosis of  By: Hassell Done FNP, Tori Milks    . Hx of cardiovascular stress test     Lexiscan Myoview (12/15): No ischemia, EF 33%, high risk due to depressed LV function   . Hx of echocardiogram     Echo (12/15): Severe LVH, EF 25-30%, diffuse HK, restrictive physiology, mild AI, mild MR, moderate LAE, mild RVE, mild RAE, moderate to severe TR, mild increased PASP  . Asthma     reports mild asthma since childhood - had COPD on dx list from prior PCP    Past Surgical History  Procedure Laterality Date  . Appendectomy    . Cardiac catheterization    . V45.02  Concerto 0000000  . Insert / replace / remove pacemaker      concerto     Current Outpatient Prescriptions  Medication Sig Dispense Refill  . albuterol (PROVENTIL HFA;VENTOLIN HFA) 108 (90 BASE) MCG/ACT inhaler Inhale 2 puffs into the lungs every 6 (six) hours as needed. 1 Inhaler 0  . aspirin 81 MG tablet Take 81 mg by mouth daily.      [name] atorvastatin (LIPITOR) 40 MG tablet Take 1 tablet (40 mg total) by mouth daily. 90 tablet 3  . carvedilol (COREG) 25 MG tablet Take 1 tablet (25 mg total) by mouth 2 (two) times daily with a meal. 180 tablet 3  . furosemide (LASIX) 40 MG tablet Take 1 tablet (40 mg total) by mouth 2 (two) times daily.    .  isosorbide-hydrALAZINE (BIDIL) 20-37.5 MG per tablet Take 1.5 tablets by mouth 2 (two) times daily. 120 tablet 6  . levothyroxine (SYNTHROID, LEVOTHROID) 150 MCG tablet Take 1 tablet (150 mcg total) by mouth daily. 30 tablet 2  . nitroGLYCERIN (NITROSTAT) 0.4 MG SL tablet Place 1 tablet (0.4 mg total) under the tongue every 5 (five) minutes as needed. 25 tablet 6  . omeprazole (PRILOSEC) 20 MG capsule Take 20 mg by mouth daily.    . Potassium Chloride ER 20 MEQ TBCR Day ONE take 2 tabs twice that day; Starting as of day TWO you will start on 1 tab twice daily 70 tablet 6  . sertraline (ZOLOFT) 100 MG tablet Take 1 tablet (100 mg total) by mouth daily. 90 tablet 1  . spironolactone (ALDACTONE) 25 MG tablet Take 1 tablet (25 mg total) by mouth daily. 90 tablet 3  . traZODone (DESYREL) 50 MG tablet Take 1 tablet (50 mg total) by mouth at bedtime as needed. 90 tablet 1  . sacubitril-valsartan (ENTRESTO) 49-51 MG Take 1 tablet by mouth 2 (two) times daily. 60 tablet 11   No current facility-administered medications for this visit.    Allergies:   Oxycodone-acetaminophen    Social History:  The patient  reports that she has never smoked. She has never used smokeless tobacco. She reports that she drinks alcohol.   Family History:  The patient's family history includes Asthma in her father and sister; Cancer in her sister; Diabetes in an other family member; Heart attack in her father and mother; Stroke in her brother.    ROS:  Please see the history of present illness.    Review of Systems  All other systems reviewed and are negative.    PHYSICAL EXAM: VS:  BP 153/65 mmHg  Pulse 62  Ht 4\' 11"  (1.499 m)  Wt 149 lb (67.586 kg)  BMI 30.08 kg/m2  SpO2 98%    Wt Readings from Last 3 Encounters:  09/24/14 149 lb (67.586 kg)  08/26/14 153 lb 3.2 oz (69.491 kg)  07/22/14 150 lb (68.04 kg)     GEN: Well nourished, well developed, in no acute distress HEENT: normal Neck: no JVD, no  masses Cardiac:  Normal S1/S2, RRR; no murmur, no rubs or gallops, no edema  Respiratory:  clear to auscultation bilaterally, no wheezing, rhonchi or rales. GI: soft, nontender, nondistended, + BS MS: no deformity or atrophy Skin: warm and dry  Neuro:  CNs II-XII intact, Strength and sensation are intact Psych: Normal affect   EKG:  EKG is not ordered today.  It demonstrates:   n/a   Recent Labs: 04/15/2014: Hemoglobin 13.7; Platelets 152.0 04/22/2014: Pro B Natriuretic peptide (BNP) 703.0*  06/11/2014: TSH 3.21 08/26/2014: BUN 19; Creatinine 1.18; Potassium 4.1; Sodium 139    Lipid Panel    Component Value Date/Time   CHOL 161 08/26/2014 1116   TRIG 103.0 08/26/2014 1116   HDL 64.80 08/26/2014 1116   CHOLHDL 2 08/26/2014 1116   VLDL 20.6 08/26/2014 1116   LDLCALC 76 08/26/2014 1116      ASSESSMENT AND PLAN:  1.  Chronic Systolic CHF:    Volume is stable. She is NYHA 2.  Continue current therapy. 2.  Nonischemic Cardiomyopathy:   Etiology of reduction in her ejection fraction is not entirely clear.  Question if related to uncontrolled HTN.  Continue carvedilol, hydralazine/isosorbide dinitrate, spironolactone.  I will DC Lisinopril and start Entresto 49/51 mg Twice daily.  Return in 1 week for BP and BMET.  Plan repeat Echo in 12/2014 before she sees Dr. Virl Axe in FU.   3.  Hypertension:  Her BP remains uncontrolled. She admits to taking all of her medications and took them this AM.  Will change Lisinopril to Entresto.  FU with me in 2-3 weeks to see how her BP is and how she is tolerating the Entresto. 4.  Hyperlipidemia:  Recent LDL okay on her current dose of Lipitor 40 mg daily. 5.  S/p CRT-D:  Follow up with EP as planned.   Current medicines are reviewed at length with the patient today.  Any concerns are as outlined above. The following changes have been made:  Stop Lisinopril today.  Start Entresto 49/51 mg Twice daily 36 hours after last dose of  Lisinopril.   Labs/ tests ordered today include:  Orders Placed This Encounter  Procedures  . Basic Metabolic Panel (BMET)  . Echocardiogram  . PR OFFICE OUTPATIENT VISIT 5 MINUTES    Disposition:   FU with me in 2-3 weeks and Dr. Virl Axe in August.    [address], Versie Starks, MHS 09/24/2014 6:00 PM    [facility] [place], [place], [place]  [address] Phone: [phone]; Fax: [phone]

## 2014-09-24 ENCOUNTER — Ambulatory Visit (INDEPENDENT_AMBULATORY_CARE_PROVIDER_SITE_OTHER): Payer: Self-pay | Admitting: Physician Assistant

## 2014-09-24 ENCOUNTER — Encounter: Payer: Self-pay | Admitting: Physician Assistant

## 2014-09-24 VITALS — BP 153/65 | HR 62 | Ht 59.0 in | Wt 149.0 lb

## 2014-09-24 DIAGNOSIS — I429 Cardiomyopathy, unspecified: Secondary | ICD-10-CM

## 2014-09-24 DIAGNOSIS — Z95810 Presence of automatic (implantable) cardiac defibrillator: Secondary | ICD-10-CM

## 2014-09-24 DIAGNOSIS — I5022 Chronic systolic (congestive) heart failure: Secondary | ICD-10-CM

## 2014-09-24 DIAGNOSIS — I428 Other cardiomyopathies: Secondary | ICD-10-CM

## 2014-09-24 DIAGNOSIS — E785 Hyperlipidemia, unspecified: Secondary | ICD-10-CM

## 2014-09-24 DIAGNOSIS — I10 Essential (primary) hypertension: Secondary | ICD-10-CM

## 2014-09-24 MED ORDER — SACUBITRIL-VALSARTAN 49-51 MG PO TABS: 1.0000 | ORAL_TABLET | Freq: Two times a day (BID) | ORAL | Status: DC

## 2014-09-24 NOTE — Patient Instructions (Addendum)
Medication Instructions:  1. STOP LISINOPRIL AS OF TODAY 09/24/14;  2. START ON 09/26/14 MORNING ENTRESTO 49/51 MG 1 TAB TWICE DAILY ; BE SURE TO SPACE OUT EVERY 12 HOURS   Labwork: 1. BMET IN 1 WEEK SAME DAY AS BP CHECK  Testing/Procedures: 1. Your physician has requested that you have an echocardiogram THIS IS TO BE DONE 1 WEEK BEFORE YOUR APPT WITH DR. Caryl Comes 12/2014. Echocardiography is a painless test that uses sound waves to create images of your heart. It provides your doctor with information about the size and shape of your heart and how well your heart's chambers and valves are working. This procedure takes approximately one hour. There are no restrictions for this procedure.  2. NURSE VISIT FOR BLOOD PRESSURE CHECK TO BE DONE IN 1 WEEK; BP CHECK; LISINOPRIL D/C 5/19; NEW START ENTRESTO 49/51 MG BID 09/26/14; PT ALSO NEEDS BMET SAME DAY AS BP CHECK.   Follow-Up: 1. FOLLOW UP WITH [place], [facility] 10/19/14 @ 2:40 2. YOU WILL NEED TO FOLLOW UP WITH DR. Caryl Comes 12/2014  Any Other Special Instructions Will Be Listed Below (If Applicable).

## 2014-10-01 ENCOUNTER — Other Ambulatory Visit (INDEPENDENT_AMBULATORY_CARE_PROVIDER_SITE_OTHER): Payer: Medicare Other | Admitting: *Deleted

## 2014-10-01 ENCOUNTER — Ambulatory Visit (INDEPENDENT_AMBULATORY_CARE_PROVIDER_SITE_OTHER): Payer: Medicare Other | Admitting: *Deleted

## 2014-10-01 VITALS — BP 148/66 | HR 60 | Ht 59.0 in | Wt 150.8 lb

## 2014-10-01 DIAGNOSIS — I429 Cardiomyopathy, unspecified: Secondary | ICD-10-CM

## 2014-10-01 DIAGNOSIS — E785 Hyperlipidemia, unspecified: Secondary | ICD-10-CM | POA: Diagnosis not present

## 2014-10-01 DIAGNOSIS — I5022 Chronic systolic (congestive) heart failure: Secondary | ICD-10-CM | POA: Diagnosis not present

## 2014-10-01 DIAGNOSIS — I10 Essential (primary) hypertension: Secondary | ICD-10-CM

## 2014-10-01 DIAGNOSIS — I428 Other cardiomyopathies: Secondary | ICD-10-CM

## 2014-10-01 DIAGNOSIS — Z95810 Presence of automatic (implantable) cardiac defibrillator: Secondary | ICD-10-CM

## 2014-10-01 LAB — BASIC METABOLIC PANEL
BUN: 15 mg/dL (ref 6–23)
CALCIUM: 9.2 mg/dL (ref 8.4–10.5)
CHLORIDE: 101 meq/L (ref 96–112)
CO2: 28 mEq/L (ref 19–32)
Creatinine, Ser: 1.11 mg/dL (ref 0.40–1.20)
GFR: 63.11 mL/min (ref >60.00)
Glucose, Bld: 146 mg/dL — ABNORMAL HIGH (ref 70–99)
POTASSIUM: 3.6 meq/L (ref 3.5–5.1)
Sodium: 136 mEq/L (ref 135–145)

## 2014-10-01 NOTE — Patient Instructions (Signed)
Medication Instructions:  No medication changes today.  Labwork: BMET done today  Testing/Procedures: None today.  Follow-Up: Keep your appointment  scheduled with Versie Starks 10/19/14 at 2:40PM.

## 2014-10-01 NOTE — Progress Notes (Signed)
1.) Reason for visit: BP check   2.) Name of MD requesting visit: Richardson Dopp, PA,c  3.) H&P:  Copied from office note 09/24/14 Scott Weaver,PA,c:        Hypertension: Her BP remains uncontrolled. She admits to taking all of her medications and took them this AM. Will change Lisinopril to            Entresto. FU with me in 2-3 weeks to see how her BP is and how she is tolerating the Entresto.  4.) ROS related to problem:                Pt complains of tiredness, unchanged since office visit with Scott, no other complaints including chest pain or SOB.               Pt states she did not make medication change to Entresto from lisinopril until Saturday May 21,2016 morning dose.                             Pt had BMET done today.                 5.) Assessment and plan per MD:               I reviewed with Dr Radford Pax (DOD)                          Per Dr Timmie Foerster medication changes recommended today.                                    -reinforce to pt that she should follow a low sodium diet.                                    -keep appointment already scheduled with Richardson Dopp, PA,c 10/19/14.

## 2014-10-02 NOTE — Progress Notes (Signed)
Agree  Keep appt with me Continue with current treatment plan. Richardson Dopp, PA-C   10/02/2014 2:37 PM

## 2014-10-06 ENCOUNTER — Telehealth: Payer: Self-pay | Admitting: *Deleted

## 2014-10-06 NOTE — Telephone Encounter (Signed)
lmptcb to go over results  

## 2014-10-07 NOTE — Telephone Encounter (Signed)
Pt notified of lab results with verbal understanding  

## 2014-10-12 ENCOUNTER — Other Ambulatory Visit (HOSPITAL_COMMUNITY): Payer: Commercial Managed Care - HMO

## 2014-10-15 ENCOUNTER — Other Ambulatory Visit: Payer: Self-pay | Admitting: Internal Medicine

## 2014-10-15 ENCOUNTER — Other Ambulatory Visit: Payer: Self-pay | Admitting: Family Medicine

## 2014-10-16 NOTE — Telephone Encounter (Signed)
Per note 5.19.16

## 2014-10-18 NOTE — Progress Notes (Signed)
Cardiology Office Note   Date:  10/19/2014   ID:  Morgan Clay, DOB 1945-08-06, MRN [id number]  PCP:  Morgan Clay., DO  Cardiologist/Electrophysiologist:  Dr. Virl Clay   Chief Complaint  Patient presents with  . Congestive Heart Failure     History of Present Illness: Morgan Clay is a 69 y.o. female with a hx of nonischemic cardiomyopathy, systolic CHF, status post CRT-D, LBBB, hypothyroidism. Most recent echocardiogram in 08/2013 had demonstrated normal LV function.    I saw her in December. She was volume overloaded. Follow-up stress testing demonstrated no perfusion defects. However, her EF was reduced. Echocardiogram confirmed her ejection fraction was back down to 25-30%.    I saw her 09/23/14. I placed her on Entresto. She returns for follow-up.  Since last seen she has been doing well.  She notes some heart fluttering after she takes the [place].  She denies chest pain, significant dyspnea, orthopnea, PND, edema.  No wheezing.  She has a non-productive cough that is worse outside.  She attributes this to allergies. She does note fatigue.  She denies any hx of bleeding problems.    This patients CHA2DS2-VASc Score and unadjusted Ischemic Stroke Rate (% per year) is equal to 4.8 % stroke rate/year from a score of 4 Above score calculated as 1 point each if present [CHF, HTN, DM, Vascular=MI/PAD/Aortic Plaque, Age if 65-74, or Female] Above score calculated as 2 points each if present [Age > 75, or Stroke/TIA/TE]   Studies Reviewed:  - LHC (4/06): Normal coronary arteries, EF 20-25%  - Nuclear Stress Test (12/15):  No perfusion defects, EF 33%; high risk b/c of reduced EF - Echo (4/15): Severe LVH, EF 0000000, grade 2 diastolic dysfunction, mild AI, mild LAE, mild RVE, normal RV function, moderate TR, PASP 45 mmHg  - Echo (12/15):   Severe LVH , EF 25-30%, diffuse HK , decreased LV diastolic compliance and/or increased LA pressure, mild AI, mild MR, moderate  LAE, mild RVE, mild RAE, moderate-severe TR, mildly increased PASP   Past Medical History  Diagnosis Date  . LBBB (left bundle branch block)   . Nonischemic cardiomyopathy     Follow[facility] by [facility]  . Biventricular implantable cardiac defibrillator in situ 12/24/2006    Protecta- Medtronic  ICD gen change 2012  . Chronic systolic congestive heart failure   . Hypothyroidism   . Personal history of other infectious and parasitic disease     Hepatitis B  . FIBROIDS, UTERUS 03/05/2008    Qualifier: Diagnosis of  By: Morgan Clay    . OBSTRUCTIVE SLEEP APNEA 11/14/2007    npsg 2009:  Mild osa with AHI 11/hr. Started cpap 2009, quit using due to lack of response Home sleep testing 09/2010 with AHI only 7/hr and weight loss advised by pulmonology.  . OSTEOPENIA 09/30/2008    Qualifier: Diagnosis of  By: Morgan Done FNP, Morgan Clay    . OBESITY 05/27/2009    Qualifier: Diagnosis of  By: Morgan Done FNP, Morgan Clay    . DEPRESSION 12/17/2009    Annotation: PHQ-9 score = 14 Clay on 12/17/2009 Qualifier: Diagnosis of  By: Morgan Done FNP, Morgan Clay    . Hyperlipemia 12/06/2012  . HYPERTENSION, BENIGN 04/24/2007    Qualifier: Diagnosis of  By: Morgan Done FNP, Morgan Clay    . Arthritis   . Glaucoma   . HEPATITIS C - s/p treatment with Harvoni, seeing hepatology, Morgan Clay  01/04/2007    Qualifier: Diagnosis of  By: Morgan Done FNP, Morgan Clay    .  Hx of cardiovascular stress test     Lexiscan Myoview (12/15): No ischemia, EF 33%, high risk due to depressed LV function   . Hx of echocardiogram     Echo (12/15): Severe LVH, EF 25-30%, diffuse HK, restrictive physiology, mild AI, mild MR, moderate LAE, mild RVE, mild RAE, moderate to severe TR, mild increased PASP  . Asthma     reports mild asthma since childhood - had COPD on dx list from prior PCP    Past Surgical History  Procedure Laterality Date  . Appendectomy    . Cardiac catheterization    . [id number]      Concerto 0000000  . Insert / replace / remove pacemaker        concerto     Current Outpatient Prescriptions  Medication Sig Dispense Refill  . albuterol (PROVENTIL HFA;VENTOLIN HFA) 108 (90 BASE) MCG/ACT inhaler Inhale 2 puffs into the lungs every 6 (six) hours as needed. 1 Inhaler 0  . atorvastatin (LIPITOR) 40 MG tablet Take 1 tablet (40 mg total) by mouth daily. 90 tablet 3  . carvedilol (COREG) 25 MG tablet Take 1 tablet (25 mg total) by mouth 2 (two) times daily with a meal. 180 tablet 3  . furosemide (LASIX) 40 MG tablet Take 1 tablet (40 mg total) by mouth daily.    . isosorbide-hydrALAZINE (BIDIL) 20-37.5 MG per tablet Take 1.5 tablets by mouth 2 (two) times daily. 120 tablet 6  . levothyroxine (SYNTHROID, LEVOTHROID) 150 MCG tablet TAKE ONE TABLET BY MOUTH ONCE DAILY 30 tablet 2  . nitroGLYCERIN (NITROSTAT) 0.4 MG SL tablet Place 1 tablet (0.4 mg total) under the tongue every 5 (five) minutes as needed. 25 tablet 6  . omeprazole (PRILOSEC) 20 MG capsule Take 20 mg by mouth daily.    . Potassium Chloride ER 20 MEQ TBCR 1 tablet by mouth twice daily 70 tablet 6  . sacubitril-valsartan (ENTRESTO) 49-51 MG Take 1 tablet by mouth 2 (two) times daily. 60 tablet 11  . sertraline (ZOLOFT) 100 MG tablet Take 1 tablet (100 mg total) by mouth daily. 90 tablet 1  . spironolactone (ALDACTONE) 25 MG tablet Take 1 tablet (25 mg total) by mouth daily. 90 tablet 3  . traZODone (DESYREL) 50 MG tablet Take 1 tablet (50 mg total) by mouth at bedtime as needed. 90 tablet 1  . apixaban (ELIQUIS) 5 MG TABS tablet Take 1 tablet (5 mg total) by mouth 2 (two) times daily. 60 tablet 11   No current facility-administered medications for this visit.    Allergies:   Oxycodone-acetaminophen    Social History:  The patient  reports that she has never smoked. She has never used smokeless tobacco. She reports that she drinks alcohol.   Family History:  The patient's family history includes Asthma in her father and sister; Cancer in her sister; Diabetes in an other  family member; Heart attack in her father and mother; Stroke in her brother.    ROS:  Please see the history of present illness.    Review of Systems  Cardiovascular: Positive for irregular heartbeat.  All other systems reviewed and are negative.    PHYSICAL EXAM: VS:  BP 148/70 mmHg  Pulse 68  Ht 4\' 11"  (1.499 m)  Wt 156 lb (70.761 kg)  BMI 31.49 kg/m2    Wt Readings from Last 3 Encounters:  10/19/14 156 lb (70.761 kg)  10/01/14 150 lb 12 oz (68.38 kg)  09/24/14 149 lb (67.586 kg)  GEN: Well nourished, well developed, in no acute distress HEENT: normal Neck: + JVD, no masses Cardiac:  Normal S1/S2, RRR; no murmur, no rubs or gallops, no edema  Respiratory:  clear to auscultation bilaterally, no wheezing, rhonchi or rales. GI:  distended, + BS MS: no deformity or atrophy Skin: warm and dry  Neuro:  CNs II-XII intact, Strength and sensation are intact Psych: Normal affect   EKG:  EKG is ordered today.  It demonstrates:   AFlutter, HR 68, V paced.   Recent Labs: 04/15/2014: Hemoglobin 13.7; Platelets 152.0 04/22/2014: Pro B Natriuretic peptide (BNP) 703.0* 06/11/2014: TSH 3.21 10/01/2014: BUN 15; Creatinine, Ser 1.11; Potassium 3.6; Sodium 136    Lipid Panel    Component Value Date/Time   CHOL 161 08/26/2014 1116   TRIG 103.0 08/26/2014 1116   HDL 64.80 08/26/2014 1116   CHOLHDL 2 08/26/2014 1116   VLDL 20.6 08/26/2014 1116   LDLCALC 76 08/26/2014 1116   Device Interrogation:  Device interrogation today demonstrates that the patient has been in atrial flutter/atrial fibrillation since June 1. OptivOl measurements of also been increased since 4/22.    ASSESSMENT AND PLAN:  1.  Atrial Flutter:  This is a new diagnosis. Ventricular rate is controlled.  CHADS2-VASc=4.  She is at significant risk of CVA.    -  Stop aspirin  -  Start Eliquis 5 mg twice a day  -  Obtain BMET, CBC today  -  Return in 3-4 weeks.  Consider pace termination of atrial flutter versus  elective cardioversion at that time.  -  Consider atrial flutter ablation. This can be reviewed with Dr. Caryl Comes.  2.  Acute on Chronic Systolic CHF:   She notes NYHA 2-2b symptoms. However, she does have abdominal bloating. Her weight is up 7 pounds since last seen. OptiVol measurements demonstrate increased fluid accumulation. I suspect volume overload may be related to loss of atrial kick.   -  Increase Lasix to 40 mg twice a day 1 week.   -  Increase her potassium to 40 mEq twice a day 1 week.   -  Check a BMET in 1 week.   -  Weigh daily. Call if weight increase after going back to her usual dose of Lasix.  3.  Nonischemic Cardiomyopathy:   Etiology of reduction in her ejection fraction is not entirely clear.  Question if related to uncontrolled HTN.  Continue carvedilol, hydralazine/isosorbide dinitrate, spironolactone. At last visit I started Entresto 49/51 mg Twice daily.  Recent BMET with stable renal function.   Plan repeat Echo in 12/2014 before she sees Dr. Virl Clay in FU.    4.  Hypertension:  Fair control.  Adjust diuretics as noted.  Continue to monitor.  5.  Hyperlipidemia:   Recent LDL okay on her current dose of Lipitor 40 mg daily.  6.  S/p CRT-D:   Follow up with EP as planned.   Current medicines are reviewed at length with the patient today.  Any concerns are as outlined above. The following changes have been made:  As above.   Labs/ tests ordered today include:  Orders Placed This Encounter  Procedures  . Basic Metabolic Panel (BMET)  . CBC w/Diff  . Basic Metabolic Panel (BMET)  . EKG 12-Lead    Disposition:   FU with me in 3-4 weeks and Dr. Virl Clay in August.    Signed, Versie Starks, MHS 10/19/2014 4:24 PM    [id number]  [address], [place], [place]  [address] Phone: [phone]; Fax: [phone]

## 2014-10-19 ENCOUNTER — Encounter: Payer: Self-pay | Admitting: Physician Assistant

## 2014-10-19 ENCOUNTER — Ambulatory Visit (INDEPENDENT_AMBULATORY_CARE_PROVIDER_SITE_OTHER): Payer: Medicare Other | Admitting: *Deleted

## 2014-10-19 ENCOUNTER — Ambulatory Visit (INDEPENDENT_AMBULATORY_CARE_PROVIDER_SITE_OTHER): Payer: Medicare Other | Admitting: Physician Assistant

## 2014-10-19 VITALS — BP 148/70 | HR 68 | Ht 59.0 in | Wt 156.0 lb

## 2014-10-19 DIAGNOSIS — I429 Cardiomyopathy, unspecified: Secondary | ICD-10-CM | POA: Diagnosis not present

## 2014-10-19 DIAGNOSIS — I5022 Chronic systolic (congestive) heart failure: Secondary | ICD-10-CM

## 2014-10-19 DIAGNOSIS — Z95810 Presence of automatic (implantable) cardiac defibrillator: Secondary | ICD-10-CM

## 2014-10-19 DIAGNOSIS — I483 Typical atrial flutter: Secondary | ICD-10-CM

## 2014-10-19 DIAGNOSIS — I5023 Acute on chronic systolic (congestive) heart failure: Secondary | ICD-10-CM | POA: Diagnosis not present

## 2014-10-19 DIAGNOSIS — I428 Other cardiomyopathies: Secondary | ICD-10-CM

## 2014-10-19 DIAGNOSIS — I10 Essential (primary) hypertension: Secondary | ICD-10-CM | POA: Diagnosis not present

## 2014-10-19 LAB — CUP PACEART INCLINIC DEVICE CHECK
Battery Voltage: 2.84 V
Brady Statistic AP VP Percent: 0.19 %
Brady Statistic RA Percent Paced: 0.2 %
Brady Statistic RV Percent Paced: 96.99 %
Date Time Interrogation Session: 20160613185453
HIGH POWER IMPEDANCE MEASURED VALUE: 342 Ohm
HIGH POWER IMPEDANCE MEASURED VALUE: 62 Ohm
HighPow Impedance: 190 Ohm
HighPow Impedance: 39 Ohm
Lead Channel Impedance Value: 456 Ohm
Lead Channel Pacing Threshold Amplitude: 0.875 V
Lead Channel Pacing Threshold Pulse Width: 0.4 ms
Lead Channel Pacing Threshold Pulse Width: 0.4 ms
Lead Channel Sensing Intrinsic Amplitude: 14.625 mV
Lead Channel Sensing Intrinsic Amplitude: 14.625 mV
Lead Channel Sensing Intrinsic Amplitude: 2.875 mV
Lead Channel Setting Pacing Amplitude: 2 V
Lead Channel Setting Pacing Amplitude: 2.5 V
Lead Channel Setting Pacing Pulse Width: 0.4 ms
Lead Channel Setting Sensing Sensitivity: 0.3 mV
MDC IDC MSMT LEADCHNL LV IMPEDANCE VALUE: 361 Ohm
MDC IDC MSMT LEADCHNL LV IMPEDANCE VALUE: 4047 Ohm
MDC IDC MSMT LEADCHNL LV IMPEDANCE VALUE: 4047 Ohm
MDC IDC MSMT LEADCHNL LV PACING THRESHOLD AMPLITUDE: 1 V
MDC IDC MSMT LEADCHNL RA PACING THRESHOLD AMPLITUDE: 1 V
MDC IDC MSMT LEADCHNL RA PACING THRESHOLD PULSEWIDTH: 0.4 ms
MDC IDC MSMT LEADCHNL RA SENSING INTR AMPL: 2.875 mV
MDC IDC MSMT LEADCHNL RV IMPEDANCE VALUE: 399 Ohm
MDC IDC SET LEADCHNL RA PACING AMPLITUDE: 2 V
MDC IDC SET LEADCHNL RV PACING PULSEWIDTH: 0.4 ms
MDC IDC SET ZONE DETECTION INTERVAL: 330 ms
MDC IDC SET ZONE DETECTION INTERVAL: 400 ms
MDC IDC STAT BRADY AP VS PERCENT: 0.01 %
MDC IDC STAT BRADY AS VP PERCENT: 96.8 %
MDC IDC STAT BRADY AS VS PERCENT: 3 %
Zone Setting Detection Interval: 280 ms
Zone Setting Detection Interval: 350 ms

## 2014-10-19 LAB — CBC WITH DIFFERENTIAL/PLATELET
Basophils Absolute: 0 10*3/uL (ref 0.0–0.1)
Basophils Relative: 0.3 % (ref 0.0–3.0)
EOS ABS: 0.1 10*3/uL (ref 0.0–0.7)
Eosinophils Relative: 1.2 % (ref 0.0–5.0)
HCT: 43 % (ref 36.0–46.0)
Hemoglobin: 13.9 g/dL (ref 12.0–15.0)
LYMPHS ABS: 1.4 10*3/uL (ref 0.7–4.0)
LYMPHS PCT: 24.8 % (ref 12.0–46.0)
MCHC: 32.5 g/dL (ref 30.0–36.0)
MCV: 92.3 fl (ref 78.0–100.0)
Monocytes Absolute: 0.5 10*3/uL (ref 0.1–1.0)
Monocytes Relative: 9.5 % (ref 3.0–12.0)
NEUTROS PCT: 64.2 % (ref 43.0–77.0)
Neutro Abs: 3.7 10*3/uL (ref 1.4–7.7)
PLATELETS: 157 10*3/uL (ref 150.0–400.0)
RBC: 4.65 Mil/uL (ref 3.87–5.11)
RDW: 17.7 % — ABNORMAL HIGH (ref 11.5–15.5)
WBC: 5.7 10*3/uL (ref 4.0–10.5)

## 2014-10-19 LAB — BASIC METABOLIC PANEL
BUN: 13 mg/dL (ref 6–23)
CHLORIDE: 102 meq/L (ref 96–112)
CO2: 27 meq/L (ref 19–32)
Calcium: 10 mg/dL (ref 8.4–10.5)
Creatinine, Ser: 1.09 mg/dL (ref 0.40–1.20)
GFR: 64.44 mL/min (ref >60.00)
Glucose, Bld: 119 mg/dL — ABNORMAL HIGH (ref 70–99)
Potassium: 3.2 mEq/L — ABNORMAL LOW (ref 3.5–5.1)
Sodium: 136 mEq/L (ref 135–145)

## 2014-10-19 MED ORDER — FUROSEMIDE 40 MG PO TABS: 40.0000 mg | ORAL_TABLET | Freq: Every day | ORAL | Status: DC

## 2014-10-19 MED ORDER — POTASSIUM CHLORIDE ER 20 MEQ PO TBCR: EXTENDED_RELEASE_TABLET | ORAL | Status: DC

## 2014-10-19 MED ORDER — APIXABAN 5 MG PO TABS: 5.0000 mg | ORAL_TABLET | Freq: Two times a day (BID) | ORAL | Status: DC

## 2014-10-19 NOTE — Patient Instructions (Signed)
Medication Instructions:  1. STOP ASPIRIN 2. INCREASE LASIX TO 40 MG TWICE DAILY FOR 1 WEEK THEN RESUME LASIX 40 MG DAILY 3. INCREASE POTASSIUM TO 40 MEQ TWICE DAILY FOR 1 WEEK THEN RESUME POTASSIUM 20 MEQ TWICE DAILY 4. START ELIQUIS 5 MG 1 TABLET EVERY 12 HOURS (TWICE DAILY)  Labwork: 1. TODAY BMET, CBC W/DIFF 2. YOU WILL NEED REPEAT LABS (BMET) IN 1 WEEK  Testing/Procedures: NONE  Follow-Up: 3-4 WEEKS WITH SCOTT WEAVER, PAC SAME DAY DR. Caryl Comes IS IN THE OFFICE  Any Other Special Instructions Will Be Listed Below (If Applicable).

## 2014-10-20 ENCOUNTER — Telehealth: Payer: Self-pay | Admitting: *Deleted

## 2014-10-20 NOTE — Telephone Encounter (Signed)
Pt notified of lab results and to increase K+ to 60 meq bid x 1 week then decrease K+ to 40 meq bid. Pt verbalized understanding to all instructions given by phone.

## 2014-10-22 NOTE — Progress Notes (Signed)
CRT-D device check in office w/Scott Weaver. (363) AT/AF episodes recorded (8.0%)---persistent since 6/1 + Eliquis. (1) ventricular arrhythmia episode recorded x 15 bts @ 113/206. Patient bi-ventricularly pacing 99.6% of the time with 2.9% as VSRp. Heart failure diagnostics reviewed and trends have been unstable since 4/22. No changes made this session. Tests were not performed. Patient to follow up as scheduled.

## 2014-10-27 ENCOUNTER — Other Ambulatory Visit (INDEPENDENT_AMBULATORY_CARE_PROVIDER_SITE_OTHER): Payer: Medicare Other | Admitting: *Deleted

## 2014-10-27 DIAGNOSIS — I10 Essential (primary) hypertension: Secondary | ICD-10-CM

## 2014-10-27 DIAGNOSIS — I5023 Acute on chronic systolic (congestive) heart failure: Secondary | ICD-10-CM | POA: Diagnosis not present

## 2014-10-27 LAB — BASIC METABOLIC PANEL
BUN: 23 mg/dL (ref 6–23)
CALCIUM: 9.6 mg/dL (ref 8.4–10.5)
CO2: 23 meq/L (ref 19–32)
CREATININE: 1.33 mg/dL — AB (ref 0.40–1.20)
Chloride: 104 mEq/L (ref 96–112)
GFR: 51.22 mL/min — ABNORMAL LOW (ref >60.00)
Glucose, Bld: 189 mg/dL — ABNORMAL HIGH (ref 70–99)
Potassium: 4.8 mEq/L (ref 3.5–5.1)
Sodium: 135 mEq/L (ref 135–145)

## 2014-10-28 ENCOUNTER — Telehealth: Payer: Self-pay | Admitting: *Deleted

## 2014-10-28 NOTE — Telephone Encounter (Signed)
Pt notified of lab results and will get repeat bmet 7/1 when she sees PA. Pt agreeable to plan of care.

## 2014-11-02 ENCOUNTER — Ambulatory Visit (INDEPENDENT_AMBULATORY_CARE_PROVIDER_SITE_OTHER): Payer: Medicare Other | Admitting: *Deleted

## 2014-11-02 DIAGNOSIS — I428 Other cardiomyopathies: Secondary | ICD-10-CM

## 2014-11-02 DIAGNOSIS — I429 Cardiomyopathy, unspecified: Secondary | ICD-10-CM | POA: Diagnosis not present

## 2014-11-02 DIAGNOSIS — I5022 Chronic systolic (congestive) heart failure: Secondary | ICD-10-CM

## 2014-11-02 NOTE — Progress Notes (Signed)
Remote ICD transmission.   

## 2014-11-05 LAB — CUP PACEART REMOTE DEVICE CHECK
Battery Voltage: 2.82 V
Brady Statistic AP VP Percent: 0 %
Brady Statistic AP VS Percent: 0 %
Brady Statistic AS VS Percent: 38.64 %
Brady Statistic RA Percent Paced: 0 %
Brady Statistic RV Percent Paced: 61.36 %
HIGH POWER IMPEDANCE MEASURED VALUE: 342 Ohm
HighPow Impedance: 47 Ohm
HighPow Impedance: 70 Ohm
Lead Channel Impedance Value: 399 Ohm
Lead Channel Impedance Value: 4047 Ohm
Lead Channel Impedance Value: 418 Ohm
Lead Channel Impedance Value: 456 Ohm
Lead Channel Pacing Threshold Amplitude: 0.875 V
Lead Channel Pacing Threshold Amplitude: 1 V
Lead Channel Pacing Threshold Pulse Width: 0.4 ms
Lead Channel Pacing Threshold Pulse Width: 0.4 ms
Lead Channel Sensing Intrinsic Amplitude: 17.625 mV
Lead Channel Sensing Intrinsic Amplitude: 3.625 mV
Lead Channel Setting Pacing Amplitude: 2 V
Lead Channel Setting Pacing Amplitude: 2 V
Lead Channel Setting Pacing Amplitude: 2.5 V
Lead Channel Setting Pacing Pulse Width: 0.4 ms
Lead Channel Setting Pacing Pulse Width: 0.4 ms
MDC IDC MSMT LEADCHNL LV IMPEDANCE VALUE: 4047 Ohm
MDC IDC MSMT LEADCHNL RA SENSING INTR AMPL: 3.625 mV
MDC IDC MSMT LEADCHNL RV PACING THRESHOLD AMPLITUDE: 0.75 V
MDC IDC MSMT LEADCHNL RV PACING THRESHOLD PULSEWIDTH: 0.4 ms
MDC IDC MSMT LEADCHNL RV SENSING INTR AMPL: 17.625 mV
MDC IDC SESS DTM: 20160627041707
MDC IDC SET LEADCHNL RV SENSING SENSITIVITY: 0.3 mV
MDC IDC SET ZONE DETECTION INTERVAL: 330 ms
MDC IDC STAT BRADY AS VP PERCENT: 61.35 %
Zone Setting Detection Interval: 280 ms
Zone Setting Detection Interval: 350 ms
Zone Setting Detection Interval: 400 ms

## 2014-11-06 ENCOUNTER — Ambulatory Visit: Payer: Medicare Other | Admitting: Physician Assistant

## 2014-11-13 ENCOUNTER — Encounter: Payer: Self-pay | Admitting: Internal Medicine

## 2014-12-09 ENCOUNTER — Encounter: Payer: Self-pay | Admitting: Physician Assistant

## 2014-12-09 ENCOUNTER — Ambulatory Visit (INDEPENDENT_AMBULATORY_CARE_PROVIDER_SITE_OTHER): Payer: Medicare Other | Admitting: Physician Assistant

## 2014-12-09 ENCOUNTER — Ambulatory Visit (INDEPENDENT_AMBULATORY_CARE_PROVIDER_SITE_OTHER): Payer: Medicare Other | Admitting: *Deleted

## 2014-12-09 ENCOUNTER — Encounter: Payer: Self-pay | Admitting: *Deleted

## 2014-12-09 VITALS — BP 180/80 | HR 69 | Ht 60.0 in | Wt 151.4 lb

## 2014-12-09 DIAGNOSIS — I428 Other cardiomyopathies: Secondary | ICD-10-CM

## 2014-12-09 DIAGNOSIS — Z95810 Presence of automatic (implantable) cardiac defibrillator: Secondary | ICD-10-CM

## 2014-12-09 DIAGNOSIS — I5022 Chronic systolic (congestive) heart failure: Secondary | ICD-10-CM

## 2014-12-09 DIAGNOSIS — I484 Atypical atrial flutter: Secondary | ICD-10-CM

## 2014-12-09 DIAGNOSIS — I429 Cardiomyopathy, unspecified: Secondary | ICD-10-CM

## 2014-12-09 DIAGNOSIS — I10 Essential (primary) hypertension: Secondary | ICD-10-CM | POA: Diagnosis not present

## 2014-12-09 DIAGNOSIS — E785 Hyperlipidemia, unspecified: Secondary | ICD-10-CM

## 2014-12-09 LAB — CUP PACEART INCLINIC DEVICE CHECK
Battery Voltage: 2.78 V
Brady Statistic AP VS Percent: 0 %
Brady Statistic AS VP Percent: 63.51 %
Brady Statistic AS VS Percent: 36.49 %
Date Time Interrogation Session: 20160803172350
HIGH POWER IMPEDANCE MEASURED VALUE: 70 Ohm
HighPow Impedance: 190 Ohm
HighPow Impedance: 342 Ohm
HighPow Impedance: 42 Ohm
Lead Channel Impedance Value: 399 Ohm
Lead Channel Impedance Value: 4047 Ohm
Lead Channel Impedance Value: 4047 Ohm
Lead Channel Impedance Value: 456 Ohm
Lead Channel Pacing Threshold Pulse Width: 0.4 ms
Lead Channel Setting Pacing Amplitude: 2 V
Lead Channel Setting Pacing Pulse Width: 0.4 ms
MDC IDC MSMT LEADCHNL LV IMPEDANCE VALUE: 399 Ohm
MDC IDC MSMT LEADCHNL LV PACING THRESHOLD AMPLITUDE: 1 V
MDC IDC SET LEADCHNL LV PACING PULSEWIDTH: 0.4 ms
MDC IDC SET LEADCHNL RA PACING AMPLITUDE: 2 V
MDC IDC SET LEADCHNL RV PACING AMPLITUDE: 2.5 V
MDC IDC SET LEADCHNL RV SENSING SENSITIVITY: 0.3 mV
MDC IDC SET ZONE DETECTION INTERVAL: 280 ms
MDC IDC STAT BRADY AP VP PERCENT: 0 %
MDC IDC STAT BRADY RA PERCENT PACED: 0 %
MDC IDC STAT BRADY RV PERCENT PACED: 63.51 %
Zone Setting Detection Interval: 330 ms
Zone Setting Detection Interval: 350 ms
Zone Setting Detection Interval: 400 ms

## 2014-12-09 MED ORDER — ISOSORB DINITRATE-HYDRALAZINE 20-37.5 MG PO TABS: 1.5000 | ORAL_TABLET | Freq: Three times a day (TID) | ORAL | Status: DC

## 2014-12-09 NOTE — Progress Notes (Signed)
CRT-D add-on check in device clinic for [place], [place]. No impedence/sensing/threshold testing performed, auto tests consistent with previous device measurements. 63.7% BiVp, 36.2% VSRP. AT/AF burden 100%, +Eliquis. SK attempted NIPs, converted to AF. Plan for cardioversion on 12/14/14 and f/u as scheduled.

## 2014-12-09 NOTE — Patient Instructions (Addendum)
Medication Instructions:  Your physician has recommended you make the following change in your medication:  1. INCREASE YOUR BIDIL 20-37.5 MG TO THREE TIMES A DAY BY MOUTH.   Labwork: NONE   Testing/Procedures: Your physician has recommended that you have a Cardioversion (DCCV). Electrical Cardioversion uses a jolt of electricity to your heart either through paddles or wired patches attached to your chest. This is a controlled, usually prescheduled, procedure. Defibrillation is done under light anesthesia in the hospital, and you usually go home the day of the procedure. This is done to get your heart back into a normal rhythm. You are not awake for the procedure. Please see the instruction sheet given to you today.   Follow-Up: KEEP APPT FOR ECHO AND DR. KLEIN  Any Other Special Instructions Will Be Listed Below (If Applicable). NONE

## 2014-12-09 NOTE — Progress Notes (Signed)
Cardiology Office Note   Date:  12/09/2014   ID:  Morgan Clay, DOB [birth year], MRN [id number]  PCP:  Morgan Kern., DO  Cardiologist/Electrophysiologist:  Dr. Virl Clay   Chief Complaint  Patient presents with  . Atrial Flutter     History of Present Illness: Morgan Clay is a 69 y.o. female with a hx of nonischemic cardiomyopathy, systolic CHF, status post CRT-D, LBBB, hypothyroidism. Most recent echocardiogram in 08/2013 had demonstrated normal LV function.    I saw her in December. She was volume overloaded. Follow-up stress testing demonstrated no perfusion defects. However, her EF was reduced. Echocardiogram confirmed her ejection fraction was back down to 25-30%.    I saw her 09/23/14. I placed her on Entresto.   I saw her in follow-up 10/19/14. She was in atrial flutter. CHADS2-VASc=4.  I placed her on Eliquis. I increased her Lasix for 1 week due to volume excess. She returns for follow-up.  Since last seen, she has been doing well.  Breathing is improved.  The patient denies chest pain, shortness of breath, syncope, orthopnea, PND or significant pedal edema.   Studies Reviewed:  - LHC (4/06): Normal coronary arteries, EF 20-25%  - Nuclear Stress Test (12/15):  No perfusion defects, EF 33%; high risk b/c of reduced EF - Echo (4/15): Severe LVH, EF 0000000, grade 2 diastolic dysfunction, mild AI, mild LAE, mild RVE, normal RV function, moderate TR, PASP 45 mmHg  - Echo (12/15):   Severe LVH , EF 25-30%, diffuse HK , decreased LV diastolic compliance and/or increased LA pressure, mild AI, mild MR, moderate LAE, mild RVE, mild RAE, moderate-severe TR, mildly increased PASP   Past Medical History  Diagnosis Date  . LBBB (left bundle branch block)   . Nonischemic cardiomyopathy     Followed by [facility]  . Biventricular implantable cardiac defibrillator in situ 12/24/2006    Protecta- Medtronic  ICD gen change 2012  . Chronic systolic congestive  heart failure   . Hypothyroidism   . Personal history of other infectious and parasitic disease     Hepatitis B  . FIBROIDS, UTERUS 03/05/2008    Qualifier: Diagnosis of  By: Isla Pence    . OBSTRUCTIVE SLEEP APNEA 11/14/2007    npsg 2009:  Mild osa with AHI 11/hr. Started cpap 2009, quit using due to lack of response Home sleep testing 09/2010 with AHI only 7/hr and weight loss advised by pulmonology.  . OSTEOPENIA 09/30/2008    Qualifier: Diagnosis of  By: Hassell Done FNP, Tori Milks    . OBESITY 05/27/2009    Qualifier: Diagnosis of  By: Hassell Done FNP, Tori Milks    . DEPRESSION 12/17/2009    Annotation: PHQ-9 score = 14 done on 12/17/2009 Qualifier: Diagnosis of  By: Hassell Done FNP, Tori Milks    . Hyperlipemia 12/06/2012  . HYPERTENSION, BENIGN 04/24/2007    Qualifier: Diagnosis of  By: Hassell Done FNP, Tori Milks    . Arthritis   . Glaucoma   . HEPATITIS C - s/p treatment with Harvoni, seeing hepatology, Dawn Drazek  01/04/2007    Qualifier: Diagnosis of  By: Hassell Done FNP, Tori Milks    . Hx of cardiovascular stress test     Lexiscan Myoview (12/15): No ischemia, EF 33%, high risk due to depressed LV function   . Hx of echocardiogram     Echo (12/15): Severe LVH, EF 25-30%, diffuse HK, restrictive physiology, mild AI, mild MR, moderate LAE, mild RVE, mild RAE, moderate to severe TR, mild increased PASP  .  Asthma     reports mild asthma since childhood - had COPD on dx list from prior PCP    Past Surgical History  Procedure Laterality Date  . Appendectomy    . Cardiac catheterization    . [id number]      Concerto 0000000  . Insert / replace / remove pacemaker      concerto     Current Outpatient Prescriptions  Medication Sig Dispense Refill  . albuterol (PROVENTIL HFA;VENTOLIN HFA) 108 (90 BASE) MCG/ACT inhaler Inhale 2 puffs into the lungs every 6 (six) hours as needed for wheezing or shortness of breath.    [name] apixaban (ELIQUIS) 5 MG TABS tablet Take 1 tablet (5 mg total) by mouth 2 (two) times daily. 60  tablet 11  . atorvastatin (LIPITOR) 40 MG tablet Take 1 tablet (40 mg total) by mouth daily. 90 tablet 3  . carvedilol (COREG) 25 MG tablet Take 1 tablet (25 mg total) by mouth 2 (two) times daily with a meal. 180 tablet 3  . furosemide (LASIX) 40 MG tablet Take 1 tablet (40 mg total) by mouth daily.    . isosorbide-hydrALAZINE (BIDIL) 20-37.5 MG per tablet Take 1.5 tablets by mouth 3 (three) times daily. 130 tablet 6  . levothyroxine (SYNTHROID, LEVOTHROID) 150 MCG tablet TAKE ONE TABLET BY MOUTH ONCE DAILY 30 tablet 2  . nitroGLYCERIN (NITROSTAT) 0.4 MG SL tablet Place 0.4 mg under the tongue every 5 (five) minutes as needed for chest pain.    [name] Potassium Chloride ER 20 MEQ TBCR 1 tablet by mouth twice daily 70 tablet 6  . sacubitril-valsartan (ENTRESTO) 49-51 MG Take 1 tablet by mouth 2 (two) times daily. 60 tablet 11  . sertraline (ZOLOFT) 100 MG tablet Take 1 tablet (100 mg total) by mouth daily. 90 tablet 1  . spironolactone (ALDACTONE) 25 MG tablet Take 1 tablet (25 mg total) by mouth daily. 90 tablet 3  . traZODone (DESYREL) 50 MG tablet Take 50 mg by mouth at bedtime as needed for sleep.     No current facility-administered medications for this visit.    Allergies:   Review of patient's allergies indicates no known allergies.    Social History:  The patient  reports that she has never smoked. She has never used smokeless tobacco. She reports that she drinks alcohol.   Family History:  The patient's family history includes Asthma in her father and sister; Cancer in her sister; Diabetes in an other family member; Heart attack in her father and mother; Stroke in her brother.    ROS:  Please see the history of present illness.    Review of Systems  All other systems reviewed and are negative.    PHYSICAL EXAM: VS:  BP 180/80 mmHg  Pulse 69  Ht 5' (1.524 m)  Wt 151 lb 6.4 oz (68.675 kg)  BMI 29.57 kg/m2    Wt Readings from Last 3 Encounters:  12/09/14 151 lb 6.4 oz (68.675  kg)  10/19/14 156 lb (70.761 kg)  10/01/14 150 lb 12 oz (68.38 kg)     GEN: Well nourished, well developed, in no acute distress HEENT: normal Neck: no JVD, no masses Cardiac:  Normal S1/S2, RRR; no murmur, no rubs or gallops, no edema  Respiratory:  clear to auscultation bilaterally, no wheezing, rhonchi or rales. GI:  Soft, non-distended, + BS MS: no deformity or atrophy Skin: warm and dry  Neuro:  CNs II-XII intact, Strength and sensation are intact Psych: Normal affect  EKG:  EKG is ordered today.  It demonstrates:   AFlutter, V paced, HR 69   Recent Labs: 04/22/2014: Pro B Natriuretic peptide (BNP) 703.0* 06/11/2014: TSH 3.21 10/19/2014: Hemoglobin 13.9; Platelets 157.0 10/27/2014: BUN 23; Creatinine, Ser 1.33*; Potassium 4.8; Sodium 135    Lipid Panel    Component Value Date/Time   CHOL 161 08/26/2014 1116   TRIG 103.0 08/26/2014 1116   HDL 64.80 08/26/2014 1116   CHOLHDL 2 08/26/2014 1116   VLDL 20.6 08/26/2014 1116   LDLCALC 76 08/26/2014 1116    Device Interrogation:  AFlutter 100% of the time since last check. OptiVol ok.    ASSESSMENT AND PLAN:  1.  Atrial Flutter:  She remains in AFlutter.  CHADS2-VASc=4.  Continue Eliquis 5 mg twice a day.  She has been on this without interruption for 4+ weeks.  Discussed with Dr. Virl Clay today.  He attempted pace termination of her AFlutter in the office.  This was unsuccessful.  But, she did convert from AFlutter to AFib.  We will arrange DCCV in the next few days.  Will have industry check her device prior to DCCV to make sure she has not spontaneously converted to NSR before the procedure.    2.  Chronic Systolic CHF:   Volume stable.  Continue current Rx. Check BMET today.   3.  Nonischemic Cardiomyopathy:   Etiology of reduction in her ejection fraction is not entirely clear.  Question if related to uncontrolled HTN.  May be related to AFlutter.  Continue carvedilol, hydralazine/isosorbide dinitrate,  spironolactone, Entresto. Proceed with DCCV as noted.  Plan repeat Echo in 12/2014 before she sees Dr. Virl Clay in FU.    4.  Hypertension:  BP elevated. May be related to high salt meal today.  Increase BiDil to 3 x per day.  Consider increasing Entresto if BP remains high.   5.  Hyperlipidemia:   Recent LDL okay on her current dose of Lipitor 40 mg daily.  6.  S/p CRT-D:   Follow up with EP as planned.    Medication Adjustments: Current medicines are reviewed at length with the patient today.  Any concerns are as outlined above. The following changes have been made:   Discontinued Medications   ALBUTEROL (PROVENTIL HFA;VENTOLIN HFA) 108 (90 BASE) MCG/ACT INHALER    Inhale 2 puffs into the lungs every 6 (six) hours as needed.   NITROGLYCERIN (NITROSTAT) 0.4 MG SL TABLET    Place 1 tablet (0.4 mg total) under the tongue every 5 (five) minutes as needed.   OMEPRAZOLE (PRILOSEC) 20 MG CAPSULE    Take 20 mg by mouth daily.   TRAZODONE (DESYREL) 50 MG TABLET    Take 1 tablet (50 mg total) by mouth at bedtime as needed.   Modified Medications   Modified Medication Previous Medication   ISOSORBIDE-HYDRALAZINE (BIDIL) 20-37.5 MG PER TABLET isosorbide-hydrALAZINE (BIDIL) 20-37.5 MG per tablet      Take 1.5 tablets by mouth 3 (three) times daily.    Take 1.5 tablets by mouth 2 (two) times daily.   New Prescriptions   No medications on file    Labs/ tests ordered today include:  Orders Placed This Encounter  Procedures  . Basic Metabolic Panel (BMET)  . CBC w/Diff  . EKG 12-Lead      Disposition:   FU with  Dr. Virl Clay in August after Echo.     Signed, Versie Starks, MHS 12/09/2014 5:09 PM    [facility]  [place], [place], [place]  [address] Phone: [phone]; Fax: [phone]     As above  Morgan Clay

## 2014-12-09 NOTE — Addendum Note (Signed)
Addended byKathlen Mody, Nicki Reaper T on: 12/09/2014 05:16 PM   Modules accepted: Orders

## 2014-12-10 ENCOUNTER — Telehealth: Payer: Self-pay | Admitting: *Deleted

## 2014-12-10 LAB — BASIC METABOLIC PANEL
BUN: 23 mg/dL (ref 6–23)
CALCIUM: 9.6 mg/dL (ref 8.4–10.5)
CHLORIDE: 104 meq/L (ref 96–112)
CO2: 25 mEq/L (ref 19–32)
CREATININE: 1.19 mg/dL (ref 0.40–1.20)
GFR: 58.21 mL/min — ABNORMAL LOW (ref >60.00)
Glucose, Bld: 97 mg/dL (ref 70–99)
Potassium: 3.6 mEq/L (ref 3.5–5.1)
Sodium: 140 mEq/L (ref 135–145)

## 2014-12-10 LAB — CBC WITH DIFFERENTIAL/PLATELET
Basophils Absolute: 0 10*3/uL (ref 0.0–0.1)
Basophils Relative: 0.3 % (ref 0.0–3.0)
EOS ABS: 0.1 10*3/uL (ref 0.0–0.7)
EOS PCT: 1.9 % (ref 0.0–5.0)
HEMATOCRIT: 46.4 % — AB (ref 36.0–46.0)
HEMOGLOBIN: 15.4 g/dL — AB (ref 12.0–15.0)
LYMPHS PCT: 26.9 % (ref 12.0–46.0)
Lymphs Abs: 1.4 10*3/uL (ref 0.7–4.0)
MCHC: 33.3 g/dL (ref 30.0–36.0)
MCV: 91 fl (ref 78.0–100.0)
Monocytes Absolute: 0.4 10*3/uL (ref 0.1–1.0)
Monocytes Relative: 6.9 % (ref 3.0–12.0)
NEUTROS ABS: 3.4 10*3/uL (ref 1.4–7.7)
Neutrophils Relative %: 64 % (ref 43.0–77.0)
Platelets: 133 10*3/uL — ABNORMAL LOW (ref 150.0–400.0)
RBC: 5.1 Mil/uL (ref 3.87–5.11)
RDW: 15.5 % (ref 11.5–15.5)
WBC: 5.4 10*3/uL (ref 4.0–10.5)

## 2014-12-10 NOTE — Telephone Encounter (Signed)
Pt notified of lab results by phone with verbal understanding.  

## 2014-12-10 NOTE — Telephone Encounter (Signed)
Lmptcb to go over lab results 

## 2014-12-14 ENCOUNTER — Ambulatory Visit (HOSPITAL_COMMUNITY)
Admission: RE | Admit: 2014-12-14 | Discharge: 2014-12-14 | Disposition: A | Discharge status: Home / Self Care | Payer: Medicare Other | Source: Ambulatory Visit | Attending: Cardiology | Admitting: Cardiology

## 2014-12-14 ENCOUNTER — Encounter (HOSPITAL_COMMUNITY)
Admission: RE | Disposition: A | Discharge status: Home / Self Care | Payer: Self-pay | Source: Ambulatory Visit | Attending: Cardiology

## 2014-12-14 ENCOUNTER — Ambulatory Visit (HOSPITAL_COMMUNITY): Payer: Medicare Other | Admitting: Certified Registered"

## 2014-12-14 ENCOUNTER — Ambulatory Visit (INDEPENDENT_AMBULATORY_CARE_PROVIDER_SITE_OTHER): Payer: Medicare Other | Admitting: Family Medicine

## 2014-12-14 ENCOUNTER — Encounter (HOSPITAL_COMMUNITY): Payer: Self-pay | Admitting: *Deleted

## 2014-12-14 DIAGNOSIS — I447 Left bundle-branch block, unspecified: Secondary | ICD-10-CM | POA: Diagnosis not present

## 2014-12-14 DIAGNOSIS — I4891 Unspecified atrial fibrillation: Secondary | ICD-10-CM | POA: Diagnosis not present

## 2014-12-14 DIAGNOSIS — E669 Obesity, unspecified: Secondary | ICD-10-CM | POA: Insufficient documentation

## 2014-12-14 DIAGNOSIS — E119 Type 2 diabetes mellitus without complications: Secondary | ICD-10-CM | POA: Diagnosis not present

## 2014-12-14 DIAGNOSIS — I4892 Unspecified atrial flutter: Secondary | ICD-10-CM | POA: Insufficient documentation

## 2014-12-14 DIAGNOSIS — E785 Hyperlipidemia, unspecified: Secondary | ICD-10-CM | POA: Diagnosis not present

## 2014-12-14 DIAGNOSIS — R69 Illness, unspecified: Secondary | ICD-10-CM

## 2014-12-14 DIAGNOSIS — I5022 Chronic systolic (congestive) heart failure: Secondary | ICD-10-CM | POA: Insufficient documentation

## 2014-12-14 DIAGNOSIS — I429 Cardiomyopathy, unspecified: Secondary | ICD-10-CM | POA: Diagnosis not present

## 2014-12-14 DIAGNOSIS — Z79899 Other long term (current) drug therapy: Secondary | ICD-10-CM | POA: Insufficient documentation

## 2014-12-14 DIAGNOSIS — J45909 Unspecified asthma, uncomplicated: Secondary | ICD-10-CM | POA: Diagnosis not present

## 2014-12-14 DIAGNOSIS — I10 Essential (primary) hypertension: Secondary | ICD-10-CM | POA: Diagnosis not present

## 2014-12-14 DIAGNOSIS — I484 Atypical atrial flutter: Secondary | ICD-10-CM

## 2014-12-14 DIAGNOSIS — G4733 Obstructive sleep apnea (adult) (pediatric): Secondary | ICD-10-CM | POA: Insufficient documentation

## 2014-12-14 DIAGNOSIS — Z95810 Presence of automatic (implantable) cardiac defibrillator: Secondary | ICD-10-CM | POA: Insufficient documentation

## 2014-12-14 DIAGNOSIS — E039 Hypothyroidism, unspecified: Secondary | ICD-10-CM | POA: Insufficient documentation

## 2014-12-14 DIAGNOSIS — B1920 Unspecified viral hepatitis C without hepatic coma: Secondary | ICD-10-CM | POA: Insufficient documentation

## 2014-12-14 DIAGNOSIS — F329 Major depressive disorder, single episode, unspecified: Secondary | ICD-10-CM | POA: Diagnosis not present

## 2014-12-14 DIAGNOSIS — M1990 Unspecified osteoarthritis, unspecified site: Secondary | ICD-10-CM | POA: Diagnosis not present

## 2014-12-14 DIAGNOSIS — Z7901 Long term (current) use of anticoagulants: Secondary | ICD-10-CM | POA: Insufficient documentation

## 2014-12-14 HISTORY — DX: Presence of automatic (implantable) cardiac defibrillator: Z95.810

## 2014-12-14 HISTORY — PX: CARDIOVERSION: SHX1299

## 2014-12-14 SURGERY — CARDIOVERSION
Anesthesia: General

## 2014-12-14 MED ORDER — PROPOFOL 10 MG/ML IV BOLUS
INTRAVENOUS | Status: DC | PRN
  Administered 2014-12-14 (×2): 50 mg via INTRAVENOUS

## 2014-12-14 MED ORDER — SODIUM CHLORIDE 0.9 % IJ SOLN: 3.0000 mL | INTRAMUSCULAR | Status: DC | PRN

## 2014-12-14 MED ORDER — LIDOCAINE HCL (CARDIAC) 20 MG/ML IV SOLN
INTRAVENOUS | Status: DC | PRN
  Administered 2014-12-14: 50 mg via INTRAVENOUS

## 2014-12-14 MED ORDER — SODIUM CHLORIDE 0.9 % IV SOLN
250.0000 mL | INTRAVENOUS | Status: DC
  Administered 2014-12-14: 10:00:00 via INTRAVENOUS

## 2014-12-14 MED ORDER — SODIUM CHLORIDE 0.9 % IJ SOLN: 3.0000 mL | Freq: Two times a day (BID) | INTRAMUSCULAR | Status: DC

## 2014-12-14 NOTE — Anesthesia Postprocedure Evaluation (Signed)
  Anesthesia Post-op Note  Patient: Morgan Clay  Procedure(s) Performed: Procedure(s): CARDIOVERSION (N/A)  Patient Location: Endoscopy Unit  Anesthesia Type:General  Level of Consciousness: awake  Airway and Oxygen Therapy: Patient Spontanous Breathing  Post-op Pain: none  Post-op Assessment: Post-op Vital signs reviewed, Patient's Cardiovascular Status Stable, Respiratory Function Stable, Patent Airway, No signs of Nausea or vomiting and Pain level controlled              Post-op Vital Signs: Reviewed and stable  Last Vitals:  Filed Vitals:   12/14/14 1000  BP: 165/79  Pulse: 79  Temp:   Resp: 25    Complications: No apparent anesthesia complications

## 2014-12-14 NOTE — Progress Notes (Signed)
NO SHOW FOR WELLNESS VISIT: On review of chart prior to visit:  A. Flutter/CsCHF/NICM/HTN/HLD/s/p CRT-D: -recently saw her cardiologist, bidil increased -meds:eliquis, coreg 25, lasix 40mg , lisosorbide-hydralazine 30-37.5 1.5 tablets tid, sacubitril-valsartan 49-51, spironolactone, atorvastatin -denies:   GERD: -on PPI prn -reports: this improved on the PPI -denies:   Depression:  -takes zoloft 100mg  and trazadone 50mg  prn at bedtime -denies:   Diabetes:  -was doing curves  -diet  -home BS: does not check  -last eye exam:  -denies:   Hypothyroid:  -on synthroid 137 mcg  -takes synthroid daily on empty stomach  -denies:   Hep C:  -followed and managed by hepatologist  -s/p harvoni in the past with cure per review recent labs but with sig fibrosis and scarring -per notes getting serial USs and ok 02/2014  Renal Insufficiency/Proteinuria:  -seeing nephrologist for this - recent notes from [place] reviewed - stopping hep C tx and monitoring labs -she had protein in the urine and was advised to stop mobic

## 2014-12-14 NOTE — Transfer of Care (Signed)
Immediate Anesthesia Transfer of Care Note  Patient: Morgan Clay  Procedure(s) Performed: Procedure(s): CARDIOVERSION (N/A)  Patient Location: PACU  Anesthesia Type:General  Level of Consciousness: awake and oriented  Airway & Oxygen Therapy: Patient Spontanous Breathing and Patient connected to nasal cannula oxygen  Post-op Assessment: Report given to RN, Post -op Vital signs reviewed and stable and Patient moving all extremities  Post vital signs: Reviewed and stable  Last Vitals:  Filed Vitals:   12/14/14 1000  BP: 165/79  Pulse: 79  Temp:   Resp: 25    Complications: No apparent anesthesia complications

## 2014-12-14 NOTE — H&P (View-Only) (Signed)
Cardiology Office Note   Date:  12/09/2014   ID:  Morgan Clay, DOB February 15, 1946, MRN [id number]  PCP:  [name]., DO  Cardiologist/Electrophysiologist:  Dr. Virl Axe   Chief Complaint  Patient presents with  . Atrial Flutter     History of Present Illness: Morgan Clay is a 69 y.o. female with a hx of nonischemic cardiomyopathy, systolic CHF, status post CRT-D, LBBB, hypothyroidism. Most recent echocardiogram in 08/2013 had demonstrated normal LV function.    I saw her in December. She was volume overloaded. Follow-up stress testing demonstrated no perfusion defects. However, her EF was reduced. Echocardiogram confirmed her ejection fraction was back down to 25-30%.    I saw her 09/23/14. I placed her on Entresto.   I saw her in follow-up 10/19/14. She was in atrial flutter. CHADS2-VASc=4.  I placed her on Eliquis. I increased her Lasix for 1 week due to volume excess. She returns for follow-up.  Since last seen, she has been doing well.  Breathing is improved.  The patient denies chest pain, shortness of breath, syncope, orthopnea, PND or significant pedal edema.   Studies Reviewed:  - LHC (4/06): Normal coronary arteries, EF 20-25%  - Nuclear Stress Test (12/15):  No perfusion defects, EF 33%; high risk b/c of reduced EF - Echo (4/15): Severe LVH, EF 0000000, grade 2 diastolic dysfunction, mild AI, mild LAE, mild RVE, normal RV function, moderate TR, PASP 45 mmHg  - Echo (12/15):   Severe LVH , EF 25-30%, diffuse HK , decreased LV diastolic compliance and/or increased LA pressure, mild AI, mild MR, moderate LAE, mild RVE, mild RAE, moderate-severe TR, mildly increased PASP   Past Medical History  Diagnosis Date  . LBBB (left bundle branch block)   . Nonischemic cardiomyopathy     Followed by [facility]  . Biventricular implantable cardiac defibrillator in situ 12/24/2006    Protecta- [employer]  ICD gen change 2012  . Chronic systolic congestive  heart failure   . Hypothyroidism   . Personal history of other infectious and parasitic disease     Hepatitis B  . FIBROIDS, UTERUS 03/05/2008    Qualifier: Diagnosis of  By: Isla Pence    . OBSTRUCTIVE SLEEP APNEA 11/14/2007    npsg 2009:  Mild osa with AHI 11/hr. Started cpap 2009, quit using due to lack of response Home sleep testing 09/2010 with AHI only 7/hr and weight loss advised by pulmonology.  . OSTEOPENIA 09/30/2008    Qualifier: Diagnosis of  By: Hassell Done FNP, Tori Milks    . OBESITY 05/27/2009    Qualifier: Diagnosis of  By: Hassell Done FNP, Tori Milks    . DEPRESSION 12/17/2009    Annotation: PHQ-9 score = 14 done on 12/17/2009 Qualifier: Diagnosis of  By: Hassell Done FNP, Tori Milks    . Hyperlipemia 12/06/2012  . HYPERTENSION, BENIGN 04/24/2007    Qualifier: Diagnosis of  By: Hassell Done FNP, Tori Milks    . Arthritis   . Glaucoma   . HEPATITIS C - s/p treatment with Harvoni, seeing hepatology, Dawn Drazek  01/04/2007    Qualifier: Diagnosis of  By: Hassell Done FNP, Tori Milks    . Hx of cardiovascular stress test     Lexiscan Myoview (12/15): No ischemia, EF 33%, high risk due to depressed LV function   . Hx of echocardiogram     Echo (12/15): Severe LVH, EF 25-30%, diffuse HK, restrictive physiology, mild AI, mild MR, moderate LAE, mild RVE, mild RAE, moderate to severe TR, mild increased PASP  .  Asthma     reports mild asthma since childhood - had COPD on dx list from prior PCP    Past Surgical History  Procedure Laterality Date  . Appendectomy    . Cardiac catheterization    . [id number]      Concerto 0000000  . Insert / replace / remove pacemaker      concerto     Current Outpatient Prescriptions  Medication Sig Dispense Refill  . albuterol (PROVENTIL HFA;VENTOLIN HFA) 108 (90 BASE) MCG/ACT inhaler Inhale 2 puffs into the lungs every 6 (six) hours as needed for wheezing or shortness of breath.    [name] apixaban (ELIQUIS) 5 MG TABS tablet Take 1 tablet (5 mg total) by mouth 2 (two) times daily. 60  tablet 11  . atorvastatin (LIPITOR) 40 MG tablet Take 1 tablet (40 mg total) by mouth daily. 90 tablet 3  . carvedilol (COREG) 25 MG tablet Take 1 tablet (25 mg total) by mouth 2 (two) times daily with a meal. 180 tablet 3  . furosemide (LASIX) 40 MG tablet Take 1 tablet (40 mg total) by mouth daily.    . isosorbide-hydrALAZINE (BIDIL) 20-37.5 MG per tablet Take 1.5 tablets by mouth 3 (three) times daily. 130 tablet 6  . levothyroxine (SYNTHROID, LEVOTHROID) 150 MCG tablet TAKE ONE TABLET BY MOUTH ONCE DAILY 30 tablet 2  . nitroGLYCERIN (NITROSTAT) 0.4 MG SL tablet Place 0.4 mg under the tongue every 5 (five) minutes as needed for chest pain.    [name] Potassium Chloride ER 20 MEQ TBCR 1 tablet by mouth twice daily 70 tablet 6  . sacubitril-valsartan (ENTRESTO) 49-51 MG Take 1 tablet by mouth 2 (two) times daily. 60 tablet 11  . sertraline (ZOLOFT) 100 MG tablet Take 1 tablet (100 mg total) by mouth daily. 90 tablet 1  . spironolactone (ALDACTONE) 25 MG tablet Take 1 tablet (25 mg total) by mouth daily. 90 tablet 3  . traZODone (DESYREL) 50 MG tablet Take 50 mg by mouth at bedtime as needed for sleep.     No current facility-administered medications for this visit.    Allergies:   Review of patient's allergies indicates no known allergies.    Social History:  The patient  reports that she has never smoked. She has never used smokeless tobacco. She reports that she drinks alcohol.   Family History:  The patient's family history includes Asthma in her father and sister; Cancer in her sister; Diabetes in an other family member; Heart attack in her father and mother; Stroke in her brother.    ROS:  Please see the history of present illness.    Review of Systems  All other systems reviewed and are negative.    PHYSICAL EXAM: VS:  BP 180/80 mmHg  Pulse 69  Ht 5' (1.524 m)  Wt 151 lb 6.4 oz (68.675 kg)  BMI 29.57 kg/m2    Wt Readings from Last 3 Encounters:  12/09/14 151 lb 6.4 oz (68.675  kg)  10/19/14 156 lb (70.761 kg)  10/01/14 150 lb 12 oz (68.38 kg)     GEN: Well nourished, well developed, in no acute distress HEENT: normal Neck: no JVD, no masses Cardiac:  Normal S1/S2, RRR; no murmur, no rubs or gallops, no edema  Respiratory:  clear to auscultation bilaterally, no wheezing, rhonchi or rales. GI:  Soft, non-distended, + BS MS: no deformity or atrophy Skin: warm and dry  Neuro:  CNs II-XII intact, Strength and sensation are intact Psych: Normal affect  EKG:  EKG is ordered today.  It demonstrates:   AFlutter, V paced, HR 69   Recent Labs: 04/22/2014: Pro B Natriuretic peptide (BNP) 703.0* 06/11/2014: TSH 3.21 10/19/2014: Hemoglobin 13.9; Platelets 157.0 10/27/2014: BUN 23; Creatinine, Ser 1.33*; Potassium 4.8; Sodium 135    Lipid Panel    Component Value Date/Time   CHOL 161 08/26/2014 1116   TRIG 103.0 08/26/2014 1116   HDL 64.80 08/26/2014 1116   CHOLHDL 2 08/26/2014 1116   VLDL 20.6 08/26/2014 1116   LDLCALC 76 08/26/2014 1116    Device Interrogation:  AFlutter 100% of the time since last check. OptiVol ok.    ASSESSMENT AND PLAN:  1.  Atrial Flutter:  She remains in AFlutter.  CHADS2-VASc=4.  Continue Eliquis 5 mg twice a day.  She has been on this without interruption for 4+ weeks.  Discussed with Dr. Virl Axe today.  He attempted pace termination of her AFlutter in the office.  This was unsuccessful.  But, she did convert from AFlutter to AFib.  We will arrange DCCV in the next few days.  Will have industry check her device prior to DCCV to make sure she has not spontaneously converted to NSR before the procedure.    2.  Chronic Systolic CHF:   Volume stable.  Continue current Rx. Check BMET today.   3.  Nonischemic Cardiomyopathy:   Etiology of reduction in her ejection fraction is not entirely clear.  Question if related to uncontrolled HTN.  May be related to AFlutter.  Continue carvedilol, hydralazine/isosorbide dinitrate,  spironolactone, Entresto. Proceed with DCCV as noted.  Plan repeat Echo in 12/2014 before she sees Dr. Virl Axe in FU.    4.  Hypertension:  BP elevated. May be related to high salt meal today.  Increase BiDil to 3 x per day.  Consider increasing Entresto if BP remains high.   5.  Hyperlipidemia:   Recent LDL okay on her current dose of Lipitor 40 mg daily.  6.  S/p CRT-D:   Follow up with EP as planned.    Medication Adjustments: Current medicines are reviewed at length with the patient today.  Any concerns are as outlined above. The following changes have been made:   Discontinued Medications   ALBUTEROL (PROVENTIL HFA;VENTOLIN HFA) 108 (90 BASE) MCG/ACT INHALER    Inhale 2 puffs into the lungs every 6 (six) hours as needed.   NITROGLYCERIN (NITROSTAT) 0.4 MG SL TABLET    Place 1 tablet (0.4 mg total) under the tongue every 5 (five) minutes as needed.   OMEPRAZOLE (PRILOSEC) 20 MG CAPSULE    Take 20 mg by mouth daily.   TRAZODONE (DESYREL) 50 MG TABLET    Take 1 tablet (50 mg total) by mouth at bedtime as needed.   Modified Medications   Modified Medication Previous Medication   ISOSORBIDE-HYDRALAZINE (BIDIL) 20-37.5 MG PER TABLET isosorbide-hydrALAZINE (BIDIL) 20-37.5 MG per tablet      Take 1.5 tablets by mouth 3 (three) times daily.    Take 1.5 tablets by mouth 2 (two) times daily.   New Prescriptions   No medications on file    Labs/ tests ordered today include:  Orders Placed This Encounter  Procedures  . Basic Metabolic Panel (BMET)  . CBC w/Diff  . EKG 12-Lead      Disposition:   FU with  Dr. Virl Axe in August after Echo.     Signed, Versie Starks, MHS 12/09/2014 5:09 PM    [facility]  [address], [place], [place]  [address] Phone: [phone]; Fax: [phone]     As above  Virl Axe

## 2014-12-14 NOTE — Anesthesia Preprocedure Evaluation (Addendum)
Anesthesia Evaluation  Patient identified by MRN, date of birth, ID band Patient awake    Reviewed: Allergy & Precautions, NPO status , Patient's Chart, lab work & pertinent test results  Airway Mallampati: III  TM Distance: >3 FB Neck ROM: Full    Dental  (+) Teeth Intact, Dental Advisory Given   Pulmonary asthma , sleep apnea ,  breath sounds clear to auscultation        Cardiovascular hypertension, Pt. on medications and Pt. on home beta blockers +CHF + dysrhythmias Atrial Fibrillation + Cardiac Defibrillator Rhythm:Irregular     Neuro/Psych PSYCHIATRIC DISORDERS Depression    GI/Hepatic negative GI ROS, (+) Hepatitis -, C  Endo/Other  diabetes, Well Controlled, Type 2, Oral Hypoglycemic AgentsHypothyroidism   Renal/GU Renal InsufficiencyRenal disease     Musculoskeletal  (+) Arthritis -,   Abdominal   Peds  Hematology   Anesthesia Other Findings   Reproductive/Obstetrics                            Anesthesia Physical Anesthesia Plan  ASA: III  Anesthesia Plan: General   Post-op Pain Management:    Induction: Intravenous  Airway Management Planned: Mask  Additional Equipment: None  Intra-op Plan:   Post-operative Plan: Extubation in OR  Informed Consent: I have reviewed the patients History and Physical, chart, labs and discussed the procedure including the risks, benefits and alternatives for the proposed anesthesia with the patient or authorized representative who has indicated his/her understanding and acceptance.   Dental advisory given  Plan Discussed with: CRNA, Anesthesiologist and Surgeon  Anesthesia Plan Comments:        Anesthesia Quick Evaluation

## 2014-12-14 NOTE — CV Procedure (Signed)
    Cardioversion Note  Morgan Clay [id number] 14-Feb-1946  Procedure: DC Cardioversion Indications: atrial flutter  Procedure Details Consent: Obtained Time Out: Verified patient identification, verified procedure, site/side was marked, verified correct patient position, special equipment/implants available, Radiology Safety Procedures followed,  medications/allergies/relevent history reviewed, required imaging and test results available.  Performed  The patient has been on adequate anticoagulation.  The patient received IV propofol administered by an anesthesiologist for sedation.  Synchronous cardioversion was performed at 120 joules.  The cardioversion was successful.   Complications: No apparent complications Patient did tolerate procedure well.   Dorothy Spark, MD, [facility] 12/14/2014, 8:45 AM

## 2014-12-14 NOTE — Discharge Instructions (Signed)
Conscious Sedation, Adult, Care After °Refer to this sheet in the next few weeks. These instructions provide you with information on caring for yourself after your procedure. Your health care provider may also give you more specific instructions. Your treatment has been planned according to current medical practices, but problems sometimes occur. Call your health care provider if you have any problems or questions after your procedure. °WHAT TO EXPECT AFTER THE PROCEDURE  °After your procedure: °· You may feel sleepy, clumsy, and have poor balance for several hours. °· Vomiting may occur if you eat too soon after the procedure. °HOME CARE INSTRUCTIONS °· Do not participate in any activities where you could become injured for at least 24 hours. Do not: °¨ Drive. °¨ Swim. °¨ Ride a bicycle. °¨ Operate heavy machinery. °¨ Cook. °¨ Use power tools. °¨ Climb ladders. °¨ Work from a high place. °· Do not make important decisions or sign legal documents until you are improved. °· If you vomit, drink water, juice, or soup when you can drink without vomiting. Make sure you have little or no nausea before eating solid foods. °· Only take over-the-counter or prescription medicines for pain, discomfort, or fever as directed by your health care provider. °· Make sure you and your family fully understand everything about the medicines given to you, including what side effects may occur. °· You should not drink alcohol, take sleeping pills, or take medicines that cause drowsiness for at least 24 hours. °· If you smoke, do not smoke without supervision. °· If you are feeling better, you may resume normal activities 24 hours after you were sedated. °· Keep all appointments with your health care provider. °SEEK MEDICAL CARE IF: °· Your skin is pale or bluish in color. °· You continue to feel nauseous or vomit. °· Your pain is getting worse and is not helped by medicine. °· You have bleeding or swelling. °· You are still sleepy or  feeling clumsy after 24 hours. °SEEK IMMEDIATE MEDICAL CARE IF: °· You develop a rash. °· You have difficulty breathing. °· You develop any type of allergic problem. °· You have a fever. °MAKE SURE YOU: °· Understand these instructions. °· Will watch your condition. °· Will get help right away if you are not doing well or get worse. °Document Released: 02/12/2013 Document Reviewed: 02/12/2013 °Exit[employer]® Patient Information ©2015 [employer]. This information is not intended to replace advice given to you by your health care provider. Make sure you discuss any questions you have with your health care provider. °Electrical Cardioversion, Care After °Refer to this sheet in the next few weeks. These instructions provide you with information on caring for yourself after your procedure. Your health care provider may also give you more specific instructions. Your treatment has been planned according to current medical practices, but problems sometimes occur. Call your health care provider if you have any problems or questions after your procedure. °WHAT TO EXPECT AFTER THE PROCEDURE °After your procedure, it is typical to have the following sensations: °· Some redness on the skin where the shocks were delivered. If this is tender, a sunburn lotion or hydrocortisone cream may help. °· Possible return of an abnormal heart rhythm within hours or days after the procedure. °HOME CARE INSTRUCTIONS °· Take medicines only as directed by your health care provider. Be sure you understand how and when to take your medicine. °· Learn how to feel your pulse and check it often. °· Limit your activity for 48 hours after   the procedure or as directed by your health care provider. °· Avoid or minimize caffeine and other stimulants as directed by your health care provider. °SEEK MEDICAL CARE IF: °· You feel like your heart is beating too fast or your pulse is not regular. °· You have any questions about your medicines. °· You have bleeding  that will not stop. °SEEK IMMEDIATE MEDICAL CARE IF: °· You are dizzy or feel faint. °· It is hard to breathe or you feel short of breath. °· There is a change in discomfort in your chest. °· Your speech is slurred or you have trouble moving an arm or leg on one side of your body. °· You get a serious muscle cramp that does not go away. °· Your fingers or toes turn cold or blue. °Document Released: 02/12/2013 Document Revised: 09/08/2013 Document Reviewed: 02/12/2013 °[employer]® Patient Information ©2015 [employer]. This information is not intended to replace advice given to you by your health care provider. Make sure you discuss any questions you have with your health care provider. ° °

## 2014-12-14 NOTE — Interval H&P Note (Signed)
History and Physical Interval Note:  12/14/2014 8:44 AM  Morgan Clay  has presented today for surgery, with the diagnosis of AFIB  The various methods of treatment have been discussed with the patient and family. After consideration of risks, benefits and other options for treatment, the patient has consented to  Procedure(s): CARDIOVERSION (N/A) as a surgical intervention .  The patient's history has been reviewed, patient examined, no change in status, stable for surgery.  I have reviewed the patient's chart and labs.  Questions were answered to the patient's satisfaction.     Dorothy Spark

## 2014-12-15 ENCOUNTER — Encounter: Payer: Self-pay | Admitting: Internal Medicine

## 2014-12-15 ENCOUNTER — Encounter (HOSPITAL_COMMUNITY): Payer: Self-pay | Admitting: Cardiology

## 2014-12-28 ENCOUNTER — Other Ambulatory Visit: Payer: Self-pay

## 2014-12-28 ENCOUNTER — Ambulatory Visit (HOSPITAL_COMMUNITY): Payer: Medicare Other | Attending: Internal Medicine

## 2014-12-28 ENCOUNTER — Ambulatory Visit (INDEPENDENT_AMBULATORY_CARE_PROVIDER_SITE_OTHER): Payer: Medicare Other | Admitting: Family Medicine

## 2014-12-28 ENCOUNTER — Encounter: Payer: Self-pay | Admitting: Family Medicine

## 2014-12-28 VITALS — BP 140/70 | HR 64 | Temp 98.0°F | Ht 59.0 in | Wt 153.8 lb

## 2014-12-28 DIAGNOSIS — E785 Hyperlipidemia, unspecified: Secondary | ICD-10-CM | POA: Diagnosis not present

## 2014-12-28 DIAGNOSIS — F32A Depression, unspecified: Secondary | ICD-10-CM

## 2014-12-28 DIAGNOSIS — Z6829 Body mass index (BMI) 29.0-29.9, adult: Secondary | ICD-10-CM | POA: Diagnosis not present

## 2014-12-28 DIAGNOSIS — I340 Nonrheumatic mitral (valve) insufficiency: Secondary | ICD-10-CM | POA: Diagnosis not present

## 2014-12-28 DIAGNOSIS — E119 Type 2 diabetes mellitus without complications: Secondary | ICD-10-CM | POA: Diagnosis not present

## 2014-12-28 DIAGNOSIS — E038 Other specified hypothyroidism: Secondary | ICD-10-CM | POA: Diagnosis not present

## 2014-12-28 DIAGNOSIS — B1920 Unspecified viral hepatitis C without hepatic coma: Secondary | ICD-10-CM | POA: Insufficient documentation

## 2014-12-28 DIAGNOSIS — E039 Hypothyroidism, unspecified: Secondary | ICD-10-CM | POA: Diagnosis not present

## 2014-12-28 DIAGNOSIS — N189 Chronic kidney disease, unspecified: Secondary | ICD-10-CM

## 2014-12-28 DIAGNOSIS — I10 Essential (primary) hypertension: Secondary | ICD-10-CM

## 2014-12-28 DIAGNOSIS — I351 Nonrheumatic aortic (valve) insufficiency: Secondary | ICD-10-CM | POA: Insufficient documentation

## 2014-12-28 DIAGNOSIS — G4733 Obstructive sleep apnea (adult) (pediatric): Secondary | ICD-10-CM | POA: Diagnosis not present

## 2014-12-28 DIAGNOSIS — I428 Other cardiomyopathies: Secondary | ICD-10-CM

## 2014-12-28 DIAGNOSIS — I129 Hypertensive chronic kidney disease with stage 1 through stage 4 chronic kidney disease, or unspecified chronic kidney disease: Secondary | ICD-10-CM | POA: Insufficient documentation

## 2014-12-28 DIAGNOSIS — E669 Obesity, unspecified: Secondary | ICD-10-CM | POA: Insufficient documentation

## 2014-12-28 DIAGNOSIS — I517 Cardiomegaly: Secondary | ICD-10-CM | POA: Diagnosis not present

## 2014-12-28 DIAGNOSIS — E1129 Type 2 diabetes mellitus with other diabetic kidney complication: Secondary | ICD-10-CM

## 2014-12-28 DIAGNOSIS — N058 Unspecified nephritic syndrome with other morphologic changes: Secondary | ICD-10-CM

## 2014-12-28 DIAGNOSIS — I429 Cardiomyopathy, unspecified: Secondary | ICD-10-CM | POA: Diagnosis not present

## 2014-12-28 DIAGNOSIS — Z0000 Encounter for general adult medical examination without abnormal findings: Secondary | ICD-10-CM

## 2014-12-28 DIAGNOSIS — I5022 Chronic systolic (congestive) heart failure: Secondary | ICD-10-CM

## 2014-12-28 DIAGNOSIS — F329 Major depressive disorder, single episode, unspecified: Secondary | ICD-10-CM

## 2014-12-28 NOTE — Progress Notes (Signed)
Medicare Annual Preventive Care Visit  (initial annual wellness or annual wellness exam) She no showed her last visit.  Concerns and/or follow up today:  A. Flutter/CsCHF/NICM/HTN/HLD/s/p CRT-D: -recently saw her cardiologist, bidil increased, recent CV and had echo today -meds:eliquis, coreg 25, lasix 40mg , lisosorbide-hydralazine 30-37.5 1.5 tablets tid, sacubitril-valsartan 49-51, spironolactone, atorvastatin -had CBC and BMP with cardiologist recently -denies: CP, SOB, DOE  Depression:  -takes zoloft 100mg  and trazadone 50mg  prn at bedtime -denies: depression  Diabetes:  -was doing curves  -diet  -home BS: does not check  -last eye exam: has not done yet this year, reports done July 2015 -denies: polyuria, polydipsia, vision changes  Hypothyroid:  -on synthroid 137 mcg  -takes synthroid daily on empty stomach  -denies: constipation, dry skin  Hep C:  -followed and managed by hepatologist  -s/p harvoni in the past with cure per review recent labs but with sig fibrosis and scarring -per notes getting serial USs  Renal Insufficiency/Proteinuria:  -seeing nephrologist for this - [place]  -she had protein in the urine and was advised to stop mobic -reports she has follow up visit for this  ROS: negative for report of fevers, unintentional weight loss, vision changes, vision loss, hearing loss or change, chest pain, sob, hemoptysis, melena, hematochezia, hematuria, genital discharge or lesions, falls, bleeding or bruising, loc, thoughts of suicide or self harm, memory loss  1.) Patient-completed health risk assessment  - completed and reviewed, see scanned documentation  2.) Review of Medical History: -PMH, PSH, Family History and current specialty and care providers reviewed and updated and listed below  - see scanned in document in chart and below  Past Medical History  Diagnosis Date  . LBBB (left bundle branch block)   . Nonischemic  cardiomyopathy     Followed by [facility]  . Biventricular implantable cardiac defibrillator in situ 12/24/2006    Protecta- Medtronic  ICD gen change 2012  . Chronic systolic congestive heart failure   . Hypothyroidism   . Personal history of other infectious and parasitic disease     Hepatitis B  . FIBROIDS, UTERUS 03/05/2008    Qualifier: Diagnosis of  By: Isla Pence    . OBSTRUCTIVE SLEEP APNEA 11/14/2007    npsg 2009:  Mild osa with AHI 11/hr. Started cpap 2009, quit using due to lack of response Home sleep testing 09/2010 with AHI only 7/hr and weight loss advised by pulmonology.  . OSTEOPENIA 09/30/2008    Qualifier: Diagnosis of  By: Hassell Done FNP, Tori Milks    . OBESITY 05/27/2009    Qualifier: Diagnosis of  By: Hassell Done FNP, Tori Milks    . DEPRESSION 12/17/2009    Annotation: PHQ-9 score = 14 done on 12/17/2009 Qualifier: Diagnosis of  By: Hassell Done FNP, Tori Milks    . Hyperlipemia 12/06/2012  . HYPERTENSION, BENIGN 04/24/2007    Qualifier: Diagnosis of  By: Hassell Done FNP, Tori Milks    . Arthritis   . Glaucoma   . HEPATITIS C - s/p treatment with Harvoni, seeing hepatology, Dawn Drazek  01/04/2007    Qualifier: Diagnosis of  By: Hassell Done FNP, Tori Milks    . Hx of cardiovascular stress test     Lexiscan Myoview (12/15): No ischemia, EF 33%, high risk due to depressed LV function   . Hx of echocardiogram     Echo (12/15): Severe LVH, EF 25-30%, diffuse HK, restrictive physiology, mild AI, mild MR, moderate LAE, mild RVE, mild RAE, moderate to severe TR, mild increased PASP  . Asthma  reports mild asthma since childhood - had COPD on dx list from prior PCP  . AICD (automatic cardioverter/defibrillator) present     Past Surgical History  Procedure Laterality Date  . Appendectomy    . Cardiac catheterization    . [id number]      Concerto 0000000  . Insert / replace / remove pacemaker      concerto  . Cardioversion N/A 12/14/2014    Procedure: CARDIOVERSION;  Surgeon: Dorothy Spark, MD;   Location: [facility] ENDOSCOPY;  Service: Cardiovascular;  Laterality: N/A;    Social History   Social History  . Marital Status: Single    Spouse Name: N/A  . Number of Children: N/A  . Years of Education: N/A   Occupational History  . Not on file.   Social History Main Topics  . Smoking status: Never Smoker   . Smokeless tobacco: Never Used  . Alcohol Use: Yes     Comment: 3 glasses of wine 3-4 times per week  . Drug Use: Not on file  . Sexual Activity: Not on file   Other Topics Concern  . Not on file   Social History Narrative   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      Home Situation: lives alone      Spiritual Beliefs: [place]      Lifestyle: no regular exercise; poor diet             The patient has a family history of  3.) Review of functional ability and level of safety:  Any difficulty hearing? YES NO  History of falling? YES NO  Any trouble with IADLs - using a phone, using transportation, grocery shopping, preparing meals, doing housework, doing laundry, taking medications and managing money? NO  Advance Directives?  NO  See summary of recommendations in Patient Instructions below.  4.) Physical Exam Filed Vitals:   12/28/14 1533  BP: 140/70  Pulse: 64  Temp: 98 F (36.7 C)   Estimated body mass index is 31.05 kg/(m^2) as calculated from the following:   Height as of this encounter: 4\' 11"  (1.499 m).   Weight as of this encounter: 153 lb 12.8 oz (69.763 kg).  EKG (optional): deferred  General: alert, appear well hydrated and in no acute distress  HEENT: visual acuity grossly intact  CV: HRRR  Lungs: CTA bilaterally  Psych: pleasant and cooperative, no obvious depression or anxiety  Cognitive function grossly intact  See patient instructions for recommendations.  Education and counseling regarding the above review of health provided with a plan for the following: -see scanned patient completed form for further  details -fall prevention strategies discussed  -healthy lifestyle discussed -importance and resources for completing advanced directives discussed -see patient instructions below for any other recommendations provided  4)The following written screening schedule of preventive measures were reviewed with assessment and plan made per below, orders and patient instructions:           Alcohol screening: 2 glasses of wine a few days per week, she understands risks     Obesity Screening and counseling: done     STI screening (Hep C if born [birth year]): declined     Tobacco Screening: none       Pneumococcal (PPSV23 -one dose after 64, one before if risk factors), influenza yearly and hepatitis B vaccines (if high risk - end stage renal disease, IV drugs, homosexual men, live in home for mentally retarded, hemophilia receiving factors) ASSESSMENT/PLAN: done  Screening mammograph (yearly if >40) ASSESSMENT/PLAN: done      Screening Pap smear/pelvic exam (q2 years) ASSESSMENT/PLAN: not indicated, neg hpv and pap in 2014       Colorectal cancer screening (FOBT yearly or flex sig q4y or colonoscopy q10y or barium enema q4y) ASSESSMENT/PLAN: donein 2011 with recommendation to repeat in 10 years      Diabetes outpatient self-management training services ASSESSMENT/PLAN: discussed      Bone mass measurements(covered q2y if indicated - estrogen def, osteoporosis, hyperparathyroid, vertebral abnormalities, osteoporosis or steroids) ASSESSMENT/PLAN:      Screening for glaucoma(q1y if high risk - diabetes, FH, AA and > 50 or hispanic and > 65) ASSESSMENT/PLAN:sees optho - advised to schedule appointment      Medical nutritional therapy for individuals with diabetes or renal disease ASSESSMENT/PLAN: done      Cardiovascular screening blood tests (lipids q5y) ASSESSMENT/PLAN: done      Diabetes screening tests ASSESSMENT/PLAN: done   7.) Summary:   Medicare annual wellness visit,  subsequent -risk factors and conditions per above assessment were discussed and treatment, recommendations and referrals were offered per documentation above and orders and patient instructions.  Other specified hypothyroidism - Plan: TSH -monitor, cont tx  Depression -stable  Obesity -lifestyle recs, congratulated on progress  Hepatitis C virus infection without hepatic coma, unspecified chronicity -sees hepatologist, advise no alcohol  HYPERTENSION, BENIGN -improved, seeing cardiologist  Chronic systolic heart failure -sees cardiology  Type 2 diabetes, controlled, with renal manifestation - Plan: Hemoglobin A1c -lifestyle recs, recheck, diabetic foot exam today  Chronic kidney disease, unspecified stage -sees nephrology  Patient Instructions    Please set up you eye exam  Please schedule your flu vaccine  Please see a lawyer and/or go to this website to help you with advanced directives and designating a health care power of attorney so that your wishes will be followed should you become too ill to make your own medical decisions.  [email or web address]  Please do not use alcohol  Please get some amlactin foot cream - use this on your feet nightly

## 2014-12-28 NOTE — Progress Notes (Signed)
Pre visit review using our clinic review tool, if applicable. No additional management support is needed unless otherwise documented below in the visit note. 

## 2014-12-28 NOTE — Patient Instructions (Addendum)
BEFORE YOU LEAVE: -follow up in 4 months  Please set up you eye exam  Please schedule your flu vaccine  Please see a lawyer and/or go to this website to help you with advanced directives and designating a health care power of attorney so that your wishes will be followed should you become too ill to make your own medical decisions.  [email or web address].no  Please do not use alcohol  Please get some amlactin foot cream - use this on your feet nightly

## 2014-12-31 ENCOUNTER — Telehealth: Payer: Self-pay | Admitting: *Deleted

## 2014-12-31 NOTE — Telephone Encounter (Signed)
Pt notified of echo results by phone with verbal understanding 

## 2015-01-01 ENCOUNTER — Other Ambulatory Visit: Payer: Self-pay | Admitting: *Deleted

## 2015-01-01 MED ORDER — ALBUTEROL SULFATE HFA 108 (90 BASE) MCG/ACT IN AERS: 2.0000 | INHALATION_SPRAY | Freq: Four times a day (QID) | RESPIRATORY_TRACT | Status: DC | PRN

## 2015-01-01 NOTE — Telephone Encounter (Signed)
Albuterol inhaler refilled 

## 2015-01-05 ENCOUNTER — Encounter: Payer: Self-pay | Admitting: Internal Medicine

## 2015-01-05 ENCOUNTER — Ambulatory Visit (INDEPENDENT_AMBULATORY_CARE_PROVIDER_SITE_OTHER): Payer: Medicare Other | Admitting: Internal Medicine

## 2015-01-05 VITALS — BP 176/78 | HR 65 | Ht 59.0 in | Wt 151.8 lb

## 2015-01-05 DIAGNOSIS — I484 Atypical atrial flutter: Secondary | ICD-10-CM | POA: Diagnosis not present

## 2015-01-05 DIAGNOSIS — Z4502 Encounter for adjustment and management of automatic implantable cardiac defibrillator: Secondary | ICD-10-CM | POA: Diagnosis not present

## 2015-01-05 DIAGNOSIS — Z95810 Presence of automatic (implantable) cardiac defibrillator: Secondary | ICD-10-CM

## 2015-01-05 DIAGNOSIS — I5022 Chronic systolic (congestive) heart failure: Secondary | ICD-10-CM

## 2015-01-05 DIAGNOSIS — I10 Essential (primary) hypertension: Secondary | ICD-10-CM

## 2015-01-05 DIAGNOSIS — I429 Cardiomyopathy, unspecified: Secondary | ICD-10-CM

## 2015-01-05 DIAGNOSIS — I5023 Acute on chronic systolic (congestive) heart failure: Secondary | ICD-10-CM

## 2015-01-05 DIAGNOSIS — I428 Other cardiomyopathies: Secondary | ICD-10-CM

## 2015-01-05 LAB — CUP PACEART INCLINIC DEVICE CHECK
Brady Statistic AP VP Percent: 0.38 %
Brady Statistic AP VS Percent: 0.01 %
Brady Statistic AS VS Percent: 0.16 %
Brady Statistic RV Percent Paced: 99.83 %
Date Time Interrogation Session: 20160830155232
HIGH POWER IMPEDANCE MEASURED VALUE: 171 Ohm
HIGH POWER IMPEDANCE MEASURED VALUE: 304 Ohm
HighPow Impedance: 40 Ohm
HighPow Impedance: 63 Ohm
Lead Channel Impedance Value: 361 Ohm
Lead Channel Impedance Value: 399 Ohm
Lead Channel Impedance Value: 4047 Ohm
Lead Channel Impedance Value: 4047 Ohm
Lead Channel Impedance Value: 418 Ohm
Lead Channel Pacing Threshold Pulse Width: 0.4 ms
Lead Channel Sensing Intrinsic Amplitude: 4.375 mV
Lead Channel Setting Pacing Pulse Width: 0.4 ms
Lead Channel Setting Sensing Sensitivity: 0.3 mV
MDC IDC MSMT BATTERY VOLTAGE: 2.74 V
MDC IDC MSMT LEADCHNL LV PACING THRESHOLD AMPLITUDE: 1 V
MDC IDC MSMT LEADCHNL LV PACING THRESHOLD PULSEWIDTH: 0.4 ms
MDC IDC MSMT LEADCHNL RA PACING THRESHOLD AMPLITUDE: 1 V
MDC IDC MSMT LEADCHNL RV PACING THRESHOLD AMPLITUDE: 1 V
MDC IDC MSMT LEADCHNL RV PACING THRESHOLD PULSEWIDTH: 0.4 ms
MDC IDC MSMT LEADCHNL RV SENSING INTR AMPL: 13.875 mV
MDC IDC SET LEADCHNL LV PACING AMPLITUDE: 2.25 V
MDC IDC SET LEADCHNL RA PACING AMPLITUDE: 2.25 V
MDC IDC SET LEADCHNL RV PACING AMPLITUDE: 2.5 V
MDC IDC SET LEADCHNL RV PACING PULSEWIDTH: 0.4 ms
MDC IDC SET ZONE DETECTION INTERVAL: 350 ms
MDC IDC SET ZONE DETECTION INTERVAL: 400 ms
MDC IDC STAT BRADY AS VP PERCENT: 99.45 %
MDC IDC STAT BRADY RA PERCENT PACED: 0.39 %
Zone Setting Detection Interval: 280 ms
Zone Setting Detection Interval: 330 ms

## 2015-01-05 MED ORDER — SACUBITRIL-VALSARTAN 97-103 MG PO TABS: 1.0000 | ORAL_TABLET | Freq: Two times a day (BID) | ORAL | Status: DC

## 2015-01-05 MED ORDER — FUROSEMIDE 40 MG PO TABS: 40.0000 mg | ORAL_TABLET | Freq: Two times a day (BID) | ORAL | Status: DC

## 2015-01-05 MED ORDER — ISOSORB DINITRATE-HYDRALAZINE 20-37.5 MG PO TABS: 1.0000 | ORAL_TABLET | Freq: Two times a day (BID) | ORAL | Status: DC

## 2015-01-05 NOTE — Patient Instructions (Addendum)
Medication Instructions:  Your physician has recommended you make the following change in your medication:  1) CHANGE Furosemide to 40 mg twice a day. 2) INCREASE Entresto to 97/103 mg twice a day. 3) DECREASE Bidil to twice a day.  Labwork: None ordered  Testing/Procedures: None ordered  Follow-Up: Remote monitoring is used to monitor your Pacemaker of ICD from home. This monitoring reduces the number of office visits required to check your device to one time per year. It allows [name] to keep an eye on the functioning of your device to ensure it is working properly. You are scheduled for a device check from home on 04/06/15. You may send your transmission at any time that day. If you have a wireless device, the transmission will be sent automatically. After your physician reviews your transmission, you will receive a postcard with your next transmission date.  Your physician wants you to follow-up in: 1 year with Dr. Caryl Comes.  You will receive a reminder letter in the mail two months in advance. If you don't receive a letter, please call our office to schedule the follow-up appointment.   Any Other Special Instructions Will Be Listed Below (If Applicable). Thank you for choosing [facility]!!

## 2015-01-05 NOTE — Progress Notes (Signed)
Patient Care Team: [name], DO as PCP - General (Family Medicine)   HPI  Morgan Clay is a 69 y.o. female Seen in followup for CRT-D implanted for nonischemic cardiomyopathy. This is undertaken 2008 with generator change 2012.  She has significant improvement from her CRT-D   She is seen by Richardson Dopp over the early summer after she presented with shortness of breath. She was noted to be in flutter.  She was cardioverted 8/16 after she presented with atypical atrial flutter with upright flutter waves in the inferior and lead V1. She had been treated with apixaban. This was associated with some heart failure area Myoview 12/15 no ischemia EF 33%  Repeat echocardiogram interestingly 8/16 demonstrated normal LV function 55-60% with mild LAE, mild MR and moderate AI Past Medical History  Diagnosis Date  . LBBB (left bundle branch block)   . Nonischemic cardiomyopathy     Follow[facility] by [facility]  . Biventricular implantable cardiac defibrillator in situ 12/24/2006    Protecta- Medtronic  ICD gen change 2012  . Chronic systolic congestive heart failure   . Hypothyroidism   . Personal history of other infectious and parasitic disease     Hepatitis B  . FIBROIDS, UTERUS 03/05/2008    Qualifier: Diagnosis of  By: Isla Pence    . OBSTRUCTIVE SLEEP APNEA 11/14/2007    npsg 2009:  Mild osa with AHI 11/hr. Started cpap 2009, quit using due to lack of response Home sleep testing 09/2010 with AHI only 7/hr and weight loss advised by pulmonology.  . OSTEOPENIA 09/30/2008    Qualifier: Diagnosis of  By: Hassell Done FNP, Tori Milks    . OBESITY 05/27/2009    Qualifier: Diagnosis of  By: Hassell Done FNP, Tori Milks    . DEPRESSION 12/17/2009    Annotation: PHQ-9 score = 14 done on 12/17/2009 Qualifier: Diagnosis of  By: Hassell Done FNP, Tori Milks    . Hyperlipemia 12/06/2012  . HYPERTENSION, BENIGN 04/24/2007    Qualifier: Diagnosis of  By: Hassell Done FNP, Tori Milks    . Arthritis   . Glaucoma     . HEPATITIS C - s/p treatment with Harvoni, seeing hepatology, Dawn Drazek  01/04/2007    Qualifier: Diagnosis of  By: Hassell Done FNP, Tori Milks    . Hx of cardiovascular stress test     Lexiscan Myoview (12/15): No ischemia, EF 33%, high risk due to depressed LV function   . Hx of echocardiogram     Echo (12/15): Severe LVH, EF 25-30%, diffuse HK, restrictive physiology, mild AI, mild MR, moderate LAE, mild RVE, mild RAE, moderate to severe TR, mild increased PASP  . Asthma     reports mild asthma since childhood - had COPD on dx list from prior PCP  . AICD (automatic cardioverter/defibrillator) present     Past Surgical History  Procedure Laterality Date  . Appendectomy    . Cardiac catheterization    . [id number]      Concerto 0000000  . Insert / replace / remove pacemaker      concerto  . Cardioversion N/A 12/14/2014    Procedure: CARDIOVERSION;  Surgeon: Dorothy Spark, MD;  Location: [facility] ENDOSCOPY;  Service: Cardiovascular;  Laterality: N/A;    Current Outpatient Prescriptions  Medication Sig Dispense Refill  . albuterol (PROVENTIL HFA;VENTOLIN HFA) 108 (90 BASE) MCG/ACT inhaler Inhale 2 puffs into the lungs every 6 (six) hours as needed for wheezing or shortness of breath. 1 Inhaler 3  . apixaban (ELIQUIS) 5 MG TABS  tablet Take 1 tablet (5 mg total) by mouth 2 (two) times daily. 60 tablet 11  . atorvastatin (LIPITOR) 40 MG tablet Take 1 tablet (40 mg total) by mouth daily. 90 tablet 3  . carvedilol (COREG) 25 MG tablet Take 1 tablet (25 mg total) by mouth 2 (two) times daily with a meal. 180 tablet 3  . furosemide (LASIX) 40 MG tablet Take 1 tablet (40 mg total) by mouth daily.    [name] levothyroxine (SYNTHROID, LEVOTHROID) 150 MCG tablet TAKE ONE TABLET BY MOUTH ONCE DAILY 30 tablet 2  . nitroGLYCERIN (NITROSTAT) 0.4 MG SL tablet Place 0.4 mg under the tongue every 5 (five) minutes as needed for chest pain.    [name] Potassium Chloride ER 20 MEQ TBCR 1 tablet by mouth twice daily 70 tablet 6   . sacubitril-valsartan (ENTRESTO) 49-51 MG Take 1 tablet by mouth 2 (two) times daily. 60 tablet 11  . sertraline (ZOLOFT) 100 MG tablet Take 1 tablet (100 mg total) by mouth daily. 90 tablet 1  . spironolactone (ALDACTONE) 25 MG tablet Take 1 tablet (25 mg total) by mouth daily. 90 tablet 3  . traZODone (DESYREL) 50 MG tablet Take 50 mg by mouth at bedtime as needed for sleep.     No current facility-administered medications for this visit.    No Known Allergies  Review of Systems negative except from HPI and PMH  Physical Exam BP 176/78 mmHg  Pulse 65  Ht 4\' 11"  (1.499 m)  Wt 151 lb 12.8 oz (68.856 kg)  BMI 30.64 kg/m2 Well developed and well nourished in no acute distress HENT normal E scleral and icterus clear Neck Supple JVP 7-8 cm carotids brisk and full Device pocket well healed; without hematoma or erythema.  There is no tethering Clear to ausculation regular rate and rhythm, no murmurs gallops or rub Soft with active bowel sounds No clubbing cyanosis   Edema Alert and oriented, grossly normal motor and sensory function Skin Warm and Dry  ECG P. Synchronous Bi pacing Assessment and  Plan  Chronic diastolic heart failure  Nonischemic cardiomyopathy  Hypertension  CRT-D- Medtronic    She's that her following cardioversion. She thinks her BiDil is associated with increasing edema. She has come to this conclusion by serial draws elimination. The edema with the BiDil is treated by up titration of her furosemide. We have discussed the  mortality reduction benefits associated with BiDil, Aldactone, carvedilol and Entresto.   Her blood pressure remains poorly controlled while 170s here, she says it is 140+ at home.  We'll plan to increase her Entresto from 48/52--97/103. We'll have her increase her furosemide to twice a day to allow [name] to continue her hydralazine/nitrates.

## 2015-01-06 DIAGNOSIS — I10 Essential (primary) hypertension: Secondary | ICD-10-CM | POA: Diagnosis not present

## 2015-01-06 DIAGNOSIS — N183 Chronic kidney disease, stage 3 (moderate): Secondary | ICD-10-CM | POA: Diagnosis not present

## 2015-01-06 DIAGNOSIS — R809 Proteinuria, unspecified: Secondary | ICD-10-CM | POA: Diagnosis not present

## 2015-01-06 DIAGNOSIS — E119 Type 2 diabetes mellitus without complications: Secondary | ICD-10-CM | POA: Diagnosis not present

## 2015-01-26 ENCOUNTER — Encounter: Payer: Self-pay | Admitting: Internal Medicine

## 2015-02-17 ENCOUNTER — Other Ambulatory Visit: Payer: Self-pay | Admitting: Family Medicine

## 2015-03-14 ENCOUNTER — Other Ambulatory Visit: Payer: Self-pay | Admitting: Family Medicine

## 2015-03-22 ENCOUNTER — Telehealth: Payer: Self-pay | Admitting: Internal Medicine

## 2015-03-22 NOTE — Telephone Encounter (Signed)
Pt is calling stating that she needs a prior auth for her medication Entresto 97-103 mg. Pt has refills but her pharmacy stated that she needs a prior auth before she can get it refill. Please advise

## 2015-03-22 NOTE — Telephone Encounter (Signed)
PA submitted through Cover My Meds. I called the patient back and advised her this has been initiated and I will call her back as soon as I hear anything. She is agreeable. Per PA form- decision may take 24-72 hours.

## 2015-03-24 ENCOUNTER — Telehealth: Payer: Self-pay

## 2015-03-24 NOTE — Telephone Encounter (Signed)
Entresto approved. Patient advised that she could pick it up from pharmacy.

## 2015-03-31 NOTE — Telephone Encounter (Signed)
Per 03/24/15 phone note from [place] nurse, PA received for [facility]

## 2015-04-06 ENCOUNTER — Telehealth: Payer: Self-pay | Admitting: Cardiology

## 2015-04-06 ENCOUNTER — Ambulatory Visit (INDEPENDENT_AMBULATORY_CARE_PROVIDER_SITE_OTHER): Payer: Commercial Managed Care - HMO | Admitting: *Deleted

## 2015-04-06 DIAGNOSIS — I429 Cardiomyopathy, unspecified: Secondary | ICD-10-CM | POA: Diagnosis not present

## 2015-04-06 DIAGNOSIS — I5022 Chronic systolic (congestive) heart failure: Secondary | ICD-10-CM | POA: Diagnosis not present

## 2015-04-06 DIAGNOSIS — I428 Other cardiomyopathies: Secondary | ICD-10-CM

## 2015-04-06 NOTE — Telephone Encounter (Signed)
Spoke with pt and reminded pt of remote transmission that is due today. Pt verbalized understanding.   

## 2015-04-08 NOTE — Progress Notes (Signed)
Remote ICD transmission.   

## 2015-04-15 ENCOUNTER — Other Ambulatory Visit: Payer: Self-pay | Admitting: Family Medicine

## 2015-04-19 LAB — CUP PACEART REMOTE DEVICE CHECK
Brady Statistic AP VS Percent: 0.01 %
Brady Statistic AS VS Percent: 0.26 %
Date Time Interrogation Session: 20161129203639
HIGH POWER IMPEDANCE MEASURED VALUE: 304 Ohm
HIGH POWER IMPEDANCE MEASURED VALUE: 50 Ohm
HIGH POWER IMPEDANCE MEASURED VALUE: 73 Ohm
Implantable Lead Implant Date: 20080818
Implantable Lead Location: 753859
Implantable Lead Model: 5076
Implantable Lead Model: 6947
Lead Channel Impedance Value: 4047 Ohm
Lead Channel Impedance Value: 456 Ohm
Lead Channel Pacing Threshold Amplitude: 1 V
Lead Channel Pacing Threshold Pulse Width: 0.4 ms
Lead Channel Pacing Threshold Pulse Width: 0.4 ms
Lead Channel Pacing Threshold Pulse Width: 0.4 ms
Lead Channel Sensing Intrinsic Amplitude: 24.875 mV
Lead Channel Setting Pacing Amplitude: 2 V
Lead Channel Setting Pacing Amplitude: 2.5 V
Lead Channel Setting Pacing Pulse Width: 0.4 ms
MDC IDC LEAD IMPLANT DT: 20080818
MDC IDC LEAD IMPLANT DT: 20080818
MDC IDC LEAD LOCATION: 753858
MDC IDC LEAD LOCATION: 753860
MDC IDC MSMT BATTERY VOLTAGE: 2.64 V
MDC IDC MSMT LEADCHNL LV IMPEDANCE VALUE: 4047 Ohm
MDC IDC MSMT LEADCHNL LV PACING THRESHOLD AMPLITUDE: 1.375 V
MDC IDC MSMT LEADCHNL RA IMPEDANCE VALUE: 456 Ohm
MDC IDC MSMT LEADCHNL RA PACING THRESHOLD AMPLITUDE: 0.875 V
MDC IDC MSMT LEADCHNL RA SENSING INTR AMPL: 3.5 mV
MDC IDC MSMT LEADCHNL RA SENSING INTR AMPL: 3.5 mV
MDC IDC MSMT LEADCHNL RV IMPEDANCE VALUE: 399 Ohm
MDC IDC MSMT LEADCHNL RV SENSING INTR AMPL: 24.875 mV
MDC IDC SET LEADCHNL RV PACING AMPLITUDE: 2.5 V
MDC IDC SET LEADCHNL RV PACING PULSEWIDTH: 0.4 ms
MDC IDC SET LEADCHNL RV SENSING SENSITIVITY: 0.3 mV
MDC IDC STAT BRADY AP VP PERCENT: 0.53 %
MDC IDC STAT BRADY AS VP PERCENT: 99.2 %
MDC IDC STAT BRADY RA PERCENT PACED: 0.54 %
MDC IDC STAT BRADY RV PERCENT PACED: 99.73 %

## 2015-04-20 ENCOUNTER — Encounter: Payer: Self-pay | Admitting: Cardiology

## 2015-04-27 ENCOUNTER — Encounter: Payer: Self-pay | Admitting: Family Medicine

## 2015-04-27 ENCOUNTER — Ambulatory Visit (INDEPENDENT_AMBULATORY_CARE_PROVIDER_SITE_OTHER): Payer: Commercial Managed Care - HMO | Admitting: Family Medicine

## 2015-04-27 VITALS — BP 138/82 | HR 81 | Temp 97.7°F | Ht 59.0 in | Wt 156.7 lb

## 2015-04-27 DIAGNOSIS — I10 Essential (primary) hypertension: Secondary | ICD-10-CM

## 2015-04-27 DIAGNOSIS — E038 Other specified hypothyroidism: Secondary | ICD-10-CM | POA: Diagnosis not present

## 2015-04-27 DIAGNOSIS — F329 Major depressive disorder, single episode, unspecified: Secondary | ICD-10-CM

## 2015-04-27 DIAGNOSIS — E1122 Type 2 diabetes mellitus with diabetic chronic kidney disease: Secondary | ICD-10-CM | POA: Diagnosis not present

## 2015-04-27 DIAGNOSIS — E785 Hyperlipidemia, unspecified: Secondary | ICD-10-CM | POA: Diagnosis not present

## 2015-04-27 DIAGNOSIS — I5022 Chronic systolic (congestive) heart failure: Secondary | ICD-10-CM | POA: Diagnosis not present

## 2015-04-27 DIAGNOSIS — I429 Cardiomyopathy, unspecified: Secondary | ICD-10-CM | POA: Diagnosis not present

## 2015-04-27 DIAGNOSIS — F32A Depression, unspecified: Secondary | ICD-10-CM

## 2015-04-27 DIAGNOSIS — I428 Other cardiomyopathies: Secondary | ICD-10-CM

## 2015-04-27 LAB — TSH: TSH: 7.8 u[IU]/mL — AB (ref 0.35–4.50)

## 2015-04-27 LAB — HEMOGLOBIN A1C: Hgb A1c MFr Bld: 6.1 % (ref 4.6–6.5)

## 2015-04-27 NOTE — Patient Instructions (Signed)
BEFORE YOU LEAVE: -labs -follow up in 4 months  -We have ordered labs or studies at this visit. It can take up to 1-2 weeks for results and processing. We will contact you with instructions IF your results are abnormal. Normal results will be released to your [facility]. If you have not heard from [name] or can not find your results in [facility] in 2 weeks please contact our office.  -We placed a referral for you as discussed to the eye doctor for your diabetic eye exam. It usually takes about 1-2 weeks to process and schedule this referral. If you have not heard from [name] regarding this appointment in 2 weeks please contact our office.  We recommend the following healthy lifestyle measures: - eat a healthy whole foods diet consisting of regular small meals composed of vegetables, fruits, beans, nuts, seeds, healthy meats such as white chicken and fish and whole grains.  - avoid sweets, white starchy foods, fried foods, fast food, processed foods, sodas, red meet and other fattening foods.  - get a least 150-300 minutes of aerobic exercise per week.

## 2015-04-27 NOTE — Progress Notes (Signed)
HPI:  Follow up:  A. Flutter/CsCHF/NICM/HTN/HLD/s/p CRT-D: -managed by cardiology -echo 8/6 much improved w/ with EF 55-60% -meds:eliquis, coreg 25, lasix 40mg  bid, lisosorbide-hydralazine 30-37.5 1.5 tablets tid, sacubitril-valsartan 97-103, spironolactone, atorvastatin -had CBC and BMP with cardiologist recently -denies: CP, SOB, DOE  Depression:  -takes zoloft 100mg  and trazadone 50mg  prn at bedtime -stable  Diabetes:  -several elevated hgba1c values 6.4-6.8 in 2012 - 2014 -diet controlled -was doing curves and continues to exercise -home BS: does not check  -last eye exam: has not done -reports needs referral -denies: polyuria, polydipsia, vision changes  Hypothyroid:  -on synthroid 137 mcg  -takes synthroid daily on empty stomach  -denies: constipation, dry skin  Hep C:  -followed and managed by hepatologist  -s/p harvoni in the past with cure per review recent labs but with sig fibrosis and scarring -per notes getting serial USs   Renal Insufficiency/Proteinuria/Resistent HTN:  -seeing nephrologist for this - [place]  -she had protein in the urine and was advised to stop mobic -per notes enestro is helping  ROS: See pertinent positives and negatives per HPI.  Past Medical History  Diagnosis Date  . LBBB (left bundle branch block)   . Nonischemic cardiomyopathy ([place])     Followed by [facility]  . Biventricular implantable cardiac defibrillator in situ 12/24/2006    Protecta- Medtronic  ICD gen change 2012  . Chronic systolic congestive heart failure ([place])   . Hypothyroidism   . Personal history of other infectious and parasitic disease     Hepatitis B  . FIBROIDS, UTERUS 03/05/2008    Qualifier: Diagnosis of  By: Isla Pence    . OBSTRUCTIVE SLEEP APNEA 11/14/2007    npsg 2009:  Mild osa with AHI 11/hr. Started cpap 2009, quit using due to lack of response Home sleep testing 09/2010 with AHI only 7/hr and weight loss  advised by pulmonology.  . OSTEOPENIA 09/30/2008    Qualifier: Diagnosis of  By: Hassell Done FNP, Tori Milks    . OBESITY 05/27/2009    Qualifier: Diagnosis of  By: Hassell Done FNP, Tori Milks    . DEPRESSION 12/17/2009    Annotation: PHQ-9 score = 14 done on 12/17/2009 Qualifier: Diagnosis of  By: Hassell Done FNP, Tori Milks    . Hyperlipemia 12/06/2012  . HYPERTENSION, BENIGN 04/24/2007    Qualifier: Diagnosis of  By: Hassell Done FNP, Tori Milks    . Arthritis   . Glaucoma   . HEPATITIS C - s/p treatment with Harvoni, seeing hepatology, Dawn Drazek  01/04/2007    Qualifier: Diagnosis of  By: Hassell Done FNP, Tori Milks    . Hx of cardiovascular stress test     Lexiscan Myoview (12/15): No ischemia, EF 33%, high risk due to depressed LV function   . Hx of echocardiogram     Echo (12/15): Severe LVH, EF 25-30%, diffuse HK, restrictive physiology, mild AI, mild MR, moderate LAE, mild RVE, mild RAE, moderate to severe TR, mild increased PASP  . Asthma     reports mild asthma since childhood - had COPD on dx list from prior PCP  . AICD (automatic cardioverter/defibrillator) present     Past Surgical History  Procedure Laterality Date  . Appendectomy    . Cardiac catheterization    . [id number]      Concerto 0000000  . Insert / replace / remove pacemaker      concerto  . Cardioversion N/A 12/14/2014    Procedure: CARDIOVERSION;  Surgeon: Dorothy Spark, MD;  Location: [place];  Service:  Cardiovascular;  Laterality: N/A;    Family History  Problem Relation Age of Onset  . Asthma Father   . Heart attack Father   . Asthma Sister   . Cancer Sister     lung  . Diabetes    . Heart attack Mother   . Stroke Brother     Social History   Social History  . Marital Status: Single    Spouse Name: N/A  . Number of Children: N/A  . Years of Education: N/A   Social History Main Topics  . Smoking status: Never Smoker   . Smokeless tobacco: Never Used  . Alcohol Use: Yes     Comment: 3 glasses of wine 3-4 times per  week  . Drug Use: None  . Sexual Activity: Not Asked   Other Topics Concern  . 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  albuterol (PROVENTIL HFA;VENTOLIN HFA) 108 (90 BASE) MCG/ACT inhaler, Inhale 2 puffs into the lungs every 6 (six) hours as needed for wheezing or shortness of breath., Disp: 1 Inhaler, Rfl: 3 .  apixaban (ELIQUIS) 5 MG TABS tablet, Take 1 tablet (5 mg total) by mouth 2 (two) times daily., Disp: 60 tablet, Rfl: 11 .  atorvastatin (LIPITOR) 40 MG tablet, Take 1 tablet (40 mg total) by mouth daily., Disp: 90 tablet, Rfl: 3 .  carvedilol (COREG) 25 MG tablet, Take 1 tablet (25 mg total) by mouth 2 (two) times daily with a meal., Disp: 180 tablet, Rfl: 3 .  furosemide (LASIX) 40 MG tablet, Take 1 tablet (40 mg total) by mouth 2 (two) times daily., Disp: 60 tablet, Rfl: 11 .  isosorbide-hydrALAZINE (BIDIL) 20-37.5 MG per tablet, Take 1 tablet by mouth 2 (two) times daily., Disp: 60 tablet, Rfl: 11 .  levothyroxine (SYNTHROID, LEVOTHROID) 150 MCG tablet, TAKE ONE TABLET BY MOUTH ONCE DAILY, Disp: 30 tablet, Rfl: 0 .  nitroGLYCERIN (NITROSTAT) 0.4 MG SL tablet, Place 0.4 mg under the tongue every 5 (five) minutes as needed for chest pain., Disp: , Rfl:  .  Potassium Chloride ER 20 MEQ TBCR, 1 tablet by mouth twice daily, Disp: 70 tablet, Rfl: 6 .  sacubitril-valsartan (ENTRESTO) 97-103 MG, Take 1 tablet by mouth 2 (two) times daily., Disp: 60 tablet, Rfl: 11 .  sertraline (ZOLOFT) 100 MG tablet, Take 1 tablet (100 mg total) by mouth daily., Disp: 90 tablet, Rfl: 1 .  spironolactone (ALDACTONE) 25 MG tablet, Take 1 tablet (25 mg total) by mouth daily., Disp: 90 tablet, Rfl: 3 .  traZODone (DESYREL) 50 MG tablet, TAKE ONE TABLET BY MOUTH AT BEDTIME, Disp: 90 tablet,  Rfl: 0  EXAM:  Filed Vitals:   04/27/15 1111  BP: 138/82  Pulse: 81  Temp: 97.7 F (36.5 C)    Body mass index is 31.63 kg/(m^2).  GENERAL: vitals reviewed and listed above, alert, oriented, appears well hydrated and in no acute distress  HEENT: atraumatic, conjunttiva clear, no obvious abnormalities on inspection of external nose and ears  NECK: no obvious masses on inspection  LUNGS: clear to auscultation bilaterally, no wheezes, rales or rhonchi, good air movement  CV: HRRR, no peripheral edema  MS: moves all  extremities without noticeable abnormality  PSYCH: pleasant and cooperative, no obvious depression or anxiety  ASSESSMENT AND PLAN:  Discussed the following assessment and plan:  Controlled type 2 diabetes mellitus with chronic kidney disease, without long-term current use of insulin, unspecified CKD stage ([place]) - Plan: Hemoglobin A1c, Ambulatory referral to Ophthalmology  Hyperlipemia  Nonischemic cardiomyopathy ([place])  Chronic systolic heart failure ([facility])  HYPERTENSION, BENIGN  Other specified hypothyroidism - Plan: TSH  Depression  -spent ome time educating her on her diabetes as she thought since not on meds she did not have diabetes -referred for diabetic eye exam -labs -follow up in 4 months -Patient advised to return or notify a doctor immediately if symptoms worsen or persist or new concerns arise.  Patient Instructions  BEFORE YOU LEAVE: -labs -follow up in 4 months  -We have ordered labs or studies at this visit. It can take up to 1-2 weeks for results and processing. We will contact you with instructions IF your results are abnormal. Normal results will be released to your [facility]. If you have not heard from [name] or can not find your results in [facility] in 2 weeks please contact our office.  -We placed a referral for you as discussed to the eye doctor for your diabetic eye exam. It usually takes about 1-2 weeks to process and schedule this  referral. If you have not heard from [name] regarding this appointment in 2 weeks please contact our office.  We recommend the following healthy lifestyle measures: - eat a healthy whole foods diet consisting of regular small meals composed of vegetables, fruits, beans, nuts, seeds, healthy meats such as white chicken and fish and whole grains.  - avoid sweets, white starchy foods, fried foods, fast food, processed foods, sodas, red meet and other fattening foods.  - get a least 150-300 minutes of aerobic exercise per week.             [name]

## 2015-04-27 NOTE — Progress Notes (Signed)
Pre visit review using our clinic review tool, if applicable. No additional management support is needed unless otherwise documented below in the visit note. 

## 2015-04-29 ENCOUNTER — Ambulatory Visit: Payer: Medicare Other | Admitting: Family Medicine

## 2015-05-05 MED ORDER — LEVOTHYROXINE SODIUM 175 MCG PO TABS: 175.0000 ug | ORAL_TABLET | Freq: Every day | ORAL | Status: DC

## 2015-05-05 NOTE — Addendum Note (Signed)
Addended by: Agnes Lawrence on: 05/05/2015 04:08 PM   Modules accepted: Orders, Medications

## 2015-05-25 ENCOUNTER — Encounter: Payer: Self-pay | Admitting: Family Medicine

## 2015-05-25 ENCOUNTER — Ambulatory Visit (INDEPENDENT_AMBULATORY_CARE_PROVIDER_SITE_OTHER): Payer: Commercial Managed Care - HMO | Admitting: Family Medicine

## 2015-05-25 VITALS — BP 122/80 | HR 76 | Temp 98.5°F | Ht 59.0 in | Wt 157.8 lb

## 2015-05-25 DIAGNOSIS — J0110 Acute frontal sinusitis, unspecified: Secondary | ICD-10-CM | POA: Diagnosis not present

## 2015-05-25 MED ORDER — BENZONATATE 100 MG PO CAPS: 100.0000 mg | ORAL_CAPSULE | Freq: Three times a day (TID) | ORAL | Status: DC | PRN

## 2015-05-25 MED ORDER — AMOXICILLIN-POT CLAVULANATE 875-125 MG PO TABS: 1.0000 | ORAL_TABLET | Freq: Two times a day (BID) | ORAL | Status: DC

## 2015-05-25 NOTE — Progress Notes (Signed)
HPI:  URI: -started: 3 weeks ago -symptoms:nasal congestion, sore throat, cough - now with thick mucus and some sinus pain -denies:fever, SOB, NVD, tooth pain -has tried: nasal sprays, otc cough medication, tea and cough drops -sick contacts/travel/risks: denies flu exposure  ROS: See pertinent positives and negatives per HPI.  Past Medical History  Diagnosis Date  . LBBB (left bundle branch block)   . Nonischemic cardiomyopathy ([place])     Followed by [facility]  . Biventricular implantable cardiac defibrillator in situ 12/24/2006    Protecta- Medtronic  ICD gen change 2012  . Chronic systolic congestive heart failure ([place])   . Hypothyroidism   . Personal history of other infectious and parasitic disease     Hepatitis B  . FIBROIDS, UTERUS 03/05/2008    Qualifier: Diagnosis of  By: Isla Pence    . OBSTRUCTIVE SLEEP APNEA 11/14/2007    npsg 2009:  Mild osa with AHI 11/hr. Started cpap 2009, quit using due to lack of response Home sleep testing 09/2010 with AHI only 7/hr and weight loss advised by pulmonology.  . OSTEOPENIA 09/30/2008    Qualifier: Diagnosis of  By: Hassell Done FNP, Tori Milks    . OBESITY 05/27/2009    Qualifier: Diagnosis of  By: Hassell Done FNP, Tori Milks    . DEPRESSION 12/17/2009    Annotation: PHQ-9 score = 14 done on 12/17/2009 Qualifier: Diagnosis of  By: Hassell Done FNP, Tori Milks    . Hyperlipemia 12/06/2012  . HYPERTENSION, BENIGN 04/24/2007    Qualifier: Diagnosis of  By: Hassell Done FNP, Tori Milks    . Arthritis   . Glaucoma   . HEPATITIS C - s/p treatment with Harvoni, seeing hepatology, Dawn Drazek  01/04/2007    Qualifier: Diagnosis of  By: Hassell Done FNP, Tori Milks    . Hx of cardiovascular stress test     Lexiscan Myoview (12/15): No ischemia, EF 33%, high risk due to depressed LV function   . Hx of echocardiogram     Echo (12/15): Severe LVH, EF 25-30%, diffuse HK, restrictive physiology, mild AI, mild MR, moderate LAE, mild RVE, mild RAE, moderate to severe TR, mild  increased PASP  . Asthma     reports mild asthma since childhood - had COPD on dx list from prior PCP  . AICD (automatic cardioverter/defibrillator) present     Past Surgical History  Procedure Laterality Date  . Appendectomy    . Cardiac catheterization    . [id number]      Concerto 0000000  . Insert / replace / remove pacemaker      concerto  . Cardioversion N/A 12/14/2014    Procedure: CARDIOVERSION;  Surgeon: Dorothy Spark, MD;  Location: [facility];  Service: Cardiovascular;  Laterality: N/A;    Family History  Problem Relation Age of Onset  . Asthma Father   . Heart attack Father   . Asthma Sister   . Cancer Sister     lung  . Diabetes    . Heart attack Mother   . Stroke Brother     Social History   Social History  . Marital Status: Single    Spouse Name: N/A  . Number of Children: N/A  . Years of Education: N/A   Social History Main Topics  . Smoking status: Never Smoker   . Smokeless tobacco: Never Used  . Alcohol Use: Yes     Comment: 3 glasses of wine 3-4 times per week  . Drug Use: None  . Sexual Activity: Not Asked   Other  Topics Concern  . 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  albuterol (PROVENTIL HFA;VENTOLIN HFA) 108 (90 BASE) MCG/ACT inhaler, Inhale 2 puffs into the lungs every 6 (six) hours as needed for wheezing or shortness of breath., Disp: 1 Inhaler, Rfl: 3 .  apixaban (ELIQUIS) 5 MG TABS tablet, Take 1 tablet (5 mg total) by mouth 2 (two) times daily., Disp: 60 tablet, Rfl: 11 .  atorvastatin (LIPITOR) 40 MG tablet, Take 1 tablet (40 mg total) by mouth daily., Disp: 90 tablet, Rfl: 3 .  carvedilol (COREG) 25 MG tablet, Take 1 tablet (25 mg total) by mouth 2 (two) times daily with a meal., Disp: 180 tablet, Rfl: 3 .   furosemide (LASIX) 40 MG tablet, Take 1 tablet (40 mg total) by mouth 2 (two) times daily., Disp: 60 tablet, Rfl: 11 .  isosorbide-hydrALAZINE (BIDIL) 20-37.5 MG per tablet, Take 1 tablet by mouth 2 (two) times daily., Disp: 60 tablet, Rfl: 11 .  levothyroxine (SYNTHROID, LEVOTHROID) 175 MCG tablet, Take 1 tablet (175 mcg total) by mouth daily before breakfast., Disp: 30 tablet, Rfl: 3 .  nitroGLYCERIN (NITROSTAT) 0.4 MG SL tablet, Place 0.4 mg under the tongue every 5 (five) minutes as needed for chest pain., Disp: , Rfl:  .  Potassium Chloride ER 20 MEQ TBCR, 1 tablet by mouth twice daily, Disp: 70 tablet, Rfl: 6 .  sacubitril-valsartan (ENTRESTO) 97-103 MG, Take 1 tablet by mouth 2 (two) times daily., Disp: 60 tablet, Rfl: 11 .  sertraline (ZOLOFT) 100 MG tablet, Take 1 tablet (100 mg total) by mouth daily., Disp: 90 tablet, Rfl: 1 .  spironolactone (ALDACTONE) 25 MG tablet, Take 1 tablet (25 mg total) by mouth daily., Disp: 90 tablet, Rfl: 3 .  traZODone (DESYREL) 50 MG tablet, TAKE ONE TABLET BY MOUTH AT BEDTIME, Disp: 90 tablet, Rfl: 0 .  amoxicillin-clavulanate (AUGMENTIN) 875-125 MG tablet, Take 1 tablet by mouth 2 (two) times daily., Disp: 20 tablet, Rfl: 0 .  benzonatate (TESSALON) 100 MG capsule, Take 1 capsule (100 mg total) by mouth 3 (three) times daily as needed for cough., Disp: 20 capsule, Rfl: 0  EXAM:  Filed Vitals:   05/25/15 1540  BP: 122/80  Pulse: 76  Temp: 98.5 F (36.9 C)    Body mass index is 31.85 kg/(m^2).  GENERAL: vitals reviewed and listed above, alert, oriented, appears well hydrated and in no acute distress  HEENT: atraumatic, conjunttiva clear, no obvious abnormalities on inspection of external nose and ears, normal appearance of ear canals and TMs, clear nasal congestion, mild post oropharyngeal erythema with PND, no tonsillar edema or exudate, no sinus TTP  NECK: no obvious masses on inspection  LUNGS: clear to auscultation bilaterally, no wheezes,  rales or rhonchi, good air movement  CV: HRRR, no peripheral edema  MS: moves all extremities without noticeable abnormality  PSYCH: pleasant and cooperative, no obvious depression or anxiety  ASSESSMENT AND PLAN:  Discussed the following assessment and plan:  Acute frontal sinusitis, recurrence not specified  -at this point with current symptoms she likely has a sinus infection. Abx and cough medication prescribed. We discussed treatment side effects, likely course, antibiotic misuse, transmission, and signs  of developing a serious illness. -of course, we advised to return or notify a doctor immediately if symptoms worsen or persist or new concerns arise.    Patient Instructions  Take the antibiotic as instructed  Follow up as needed     KIM, Jarrett Soho R.

## 2015-05-25 NOTE — Progress Notes (Signed)
Pre visit review using our clinic review tool, if applicable. No additional management support is needed unless otherwise documented below in the visit note. 

## 2015-05-25 NOTE — Patient Instructions (Signed)
Take the antibiotic as instructed  Follow up as needed

## 2015-05-28 LAB — HM DIABETES EYE EXAM

## 2015-06-24 ENCOUNTER — Encounter: Payer: Self-pay | Admitting: Family Medicine

## 2015-06-30 ENCOUNTER — Other Ambulatory Visit: Payer: Self-pay | Admitting: Family Medicine

## 2015-06-30 MED ORDER — TRAZODONE HCL 50 MG PO TABS: 50.0000 mg | ORAL_TABLET | Freq: Every day | ORAL | Status: DC

## 2015-07-06 ENCOUNTER — Ambulatory Visit (INDEPENDENT_AMBULATORY_CARE_PROVIDER_SITE_OTHER): Payer: Commercial Managed Care - HMO | Admitting: *Deleted

## 2015-07-06 DIAGNOSIS — I429 Cardiomyopathy, unspecified: Secondary | ICD-10-CM | POA: Diagnosis not present

## 2015-07-06 DIAGNOSIS — I428 Other cardiomyopathies: Secondary | ICD-10-CM

## 2015-07-06 DIAGNOSIS — I5022 Chronic systolic (congestive) heart failure: Secondary | ICD-10-CM | POA: Diagnosis not present

## 2015-07-06 LAB — CUP PACEART REMOTE DEVICE CHECK
Brady Statistic AP VS Percent: 0.01 %
Brady Statistic RA Percent Paced: 0.65 %
Date Time Interrogation Session: 20170228062508
HIGH POWER IMPEDANCE MEASURED VALUE: 42 Ohm
HIGH POWER IMPEDANCE MEASURED VALUE: 64 Ohm
HighPow Impedance: 285 Ohm
Implantable Lead Implant Date: 20080818
Implantable Lead Location: 753858
Implantable Lead Location: 753859
Implantable Lead Location: 753860
Lead Channel Impedance Value: 361 Ohm
Lead Channel Impedance Value: 4047 Ohm
Lead Channel Impedance Value: 456 Ohm
Lead Channel Pacing Threshold Amplitude: 1.125 V
Lead Channel Pacing Threshold Pulse Width: 0.4 ms
Lead Channel Sensing Intrinsic Amplitude: 17.125 mV
Lead Channel Setting Pacing Amplitude: 2.5 V
Lead Channel Setting Pacing Pulse Width: 0.4 ms
MDC IDC LEAD IMPLANT DT: 20080818
MDC IDC LEAD IMPLANT DT: 20080818
MDC IDC MSMT BATTERY VOLTAGE: 2.62 V
MDC IDC MSMT LEADCHNL LV IMPEDANCE VALUE: 399 Ohm
MDC IDC MSMT LEADCHNL LV IMPEDANCE VALUE: 4047 Ohm
MDC IDC MSMT LEADCHNL LV PACING THRESHOLD AMPLITUDE: 1.375 V
MDC IDC MSMT LEADCHNL RA PACING THRESHOLD AMPLITUDE: 0.875 V
MDC IDC MSMT LEADCHNL RA PACING THRESHOLD PULSEWIDTH: 0.4 ms
MDC IDC MSMT LEADCHNL RA SENSING INTR AMPL: 4 mV
MDC IDC MSMT LEADCHNL RA SENSING INTR AMPL: 4 mV
MDC IDC MSMT LEADCHNL RV PACING THRESHOLD PULSEWIDTH: 0.4 ms
MDC IDC MSMT LEADCHNL RV SENSING INTR AMPL: 17.125 mV
MDC IDC SET LEADCHNL RA PACING AMPLITUDE: 2 V
MDC IDC SET LEADCHNL RV PACING AMPLITUDE: 2.5 V
MDC IDC SET LEADCHNL RV PACING PULSEWIDTH: 0.4 ms
MDC IDC SET LEADCHNL RV SENSING SENSITIVITY: 0.3 mV
MDC IDC STAT BRADY AP VP PERCENT: 0.64 %
MDC IDC STAT BRADY AS VP PERCENT: 86.08 %
MDC IDC STAT BRADY AS VS PERCENT: 13.27 %
MDC IDC STAT BRADY RV PERCENT PACED: 86.72 %

## 2015-07-06 NOTE — Progress Notes (Signed)
Remote ICD transmission.   

## 2015-08-04 ENCOUNTER — Other Ambulatory Visit (INDEPENDENT_AMBULATORY_CARE_PROVIDER_SITE_OTHER): Payer: Commercial Managed Care - HMO

## 2015-08-04 DIAGNOSIS — E038 Other specified hypothyroidism: Secondary | ICD-10-CM | POA: Diagnosis not present

## 2015-08-04 LAB — TSH: TSH: 1.72 u[IU]/mL (ref 0.35–4.50)

## 2015-08-06 ENCOUNTER — Encounter: Payer: Self-pay | Admitting: Cardiology

## 2015-08-26 ENCOUNTER — Ambulatory Visit: Payer: Commercial Managed Care - HMO | Admitting: Family Medicine

## 2015-08-30 ENCOUNTER — Encounter: Payer: Self-pay | Admitting: Family Medicine

## 2015-08-30 ENCOUNTER — Ambulatory Visit (INDEPENDENT_AMBULATORY_CARE_PROVIDER_SITE_OTHER): Payer: Commercial Managed Care - HMO | Admitting: Family Medicine

## 2015-08-30 VITALS — BP 142/80 | HR 88 | Temp 98.3°F | Ht 59.0 in | Wt 153.7 lb

## 2015-08-30 DIAGNOSIS — E038 Other specified hypothyroidism: Secondary | ICD-10-CM

## 2015-08-30 DIAGNOSIS — E1122 Type 2 diabetes mellitus with diabetic chronic kidney disease: Secondary | ICD-10-CM

## 2015-08-30 DIAGNOSIS — I429 Cardiomyopathy, unspecified: Secondary | ICD-10-CM

## 2015-08-30 DIAGNOSIS — E669 Obesity, unspecified: Secondary | ICD-10-CM

## 2015-08-30 DIAGNOSIS — M7062 Trochanteric bursitis, left hip: Secondary | ICD-10-CM

## 2015-08-30 DIAGNOSIS — I5022 Chronic systolic (congestive) heart failure: Secondary | ICD-10-CM | POA: Diagnosis not present

## 2015-08-30 DIAGNOSIS — I428 Other cardiomyopathies: Secondary | ICD-10-CM

## 2015-08-30 DIAGNOSIS — I10 Essential (primary) hypertension: Secondary | ICD-10-CM

## 2015-08-30 DIAGNOSIS — F3342 Major depressive disorder, recurrent, in full remission: Secondary | ICD-10-CM

## 2015-08-30 DIAGNOSIS — E785 Hyperlipidemia, unspecified: Secondary | ICD-10-CM

## 2015-08-30 DIAGNOSIS — N189 Chronic kidney disease, unspecified: Secondary | ICD-10-CM

## 2015-08-30 MED ORDER — TRAMADOL HCL 50 MG PO TABS: ORAL_TABLET | ORAL | Status: DC

## 2015-08-30 NOTE — Progress Notes (Signed)
HPI:  Follow up:  Acute issues:  L hip pain: -started 1-2 months ago -intermittent - walks a lot -will flare up for a few days then go away -sharp pain in lateral L hip -stretching helps -can't take tylenol or nsaids due to other health issues  A. Flutter/CsCHF/NICM/HTN/HLD/s/p CRT-D: -managed by cardiology -echo 8/6 much improved w/ with EF 55-60% -meds:eliquis, coreg 25, lasix 40mg  bid, lisosorbide-hydralazine 30-37.5 1.5 tablets tid, sacubitril-valsartan 97-103, spironolactone, atorvastatin -denies: CP, SOB, DOE -feels tired the last few days  Depression:  -takes zoloft 100mg  and trazadone 50mg  prn at bedtime -reports stable -tired the last few days, cold and dreary weather  Diabetes:  -several elevated hgba1c values 6.4-6.8 in 2012 - 2014 -we have discussed multiple times, but per recent renal notes - she reported she was not aware of this dx -walks daily, diet so so -home BS: does not check  -last eye exam: has not done -reports needs referral -denies: polyuria, polydipsia, vision changes  Hypothyroid:  -on synthroid 175 mcg  -takes synthroid daily on empty stomach  -denies: constipation, dry skin -tired the last few days  Hep C:  -followed and managed by hepatologist  -s/p harvoni in the past with cure per review notes, but with sig fibrosis and scarring -per notes getting serial USs   Renal Insufficiency/Proteinuria/Resistent HTN:  -seeing nephrologist for this - [place]  -she had protein in the urine and was advised to stop mobic -per notes enestro may be helping -She reports she just follow her kidney doctor and her blood pressure was fine at that visit   ROS: See pertinent positives and negatives per HPI.  Past Medical History  Diagnosis Date  . LBBB (left bundle branch block)   . Nonischemic cardiomyopathy ([place])     Followed by [facility]  . Biventricular implantable cardiac defibrillator in situ 12/24/2006     Protecta- Medtronic  ICD gen change 2012  . Chronic systolic congestive heart failure ([place])   . Hypothyroidism   . Personal history of other infectious and parasitic disease     Hepatitis B  . FIBROIDS, UTERUS 03/05/2008    Qualifier: Diagnosis of  By: Isla Pence    . OBSTRUCTIVE SLEEP APNEA 11/14/2007    npsg 2009:  Mild osa with AHI 11/hr. Started cpap 2009, quit using due to lack of response Home sleep testing 09/2010 with AHI only 7/hr and weight loss advised by pulmonology.  . OSTEOPENIA 09/30/2008    Qualifier: Diagnosis of  By: Hassell Done FNP, Tori Milks    . OBESITY 05/27/2009    Qualifier: Diagnosis of  By: Hassell Done FNP, Tori Milks    . DEPRESSION 12/17/2009    Annotation: PHQ-9 score = 14 done on 12/17/2009 Qualifier: Diagnosis of  By: Hassell Done FNP, Tori Milks    . Hyperlipemia 12/06/2012  . HYPERTENSION, BENIGN 04/24/2007    Qualifier: Diagnosis of  By: Hassell Done FNP, Tori Milks    . Arthritis   . Glaucoma   . HEPATITIS C - s/p treatment with Harvoni, seeing hepatology, Dawn Drazek  01/04/2007    Qualifier: Diagnosis of  By: Hassell Done FNP, Tori Milks    . Hx of cardiovascular stress test     Lexiscan Myoview (12/15): No ischemia, EF 33%, high risk due to depressed LV function   . Hx of echocardiogram     Echo (12/15): Severe LVH, EF 25-30%, diffuse HK, restrictive physiology, mild AI, mild MR, moderate LAE, mild RVE, mild RAE, moderate to severe TR, mild increased PASP  . Asthma  reports mild asthma since childhood - had COPD on dx list from prior PCP  . AICD (automatic cardioverter/defibrillator) present     Past Surgical History  Procedure Laterality Date  . Appendectomy    . Cardiac catheterization    . [id number]      Concerto 0000000  . Insert / replace / remove pacemaker      concerto  . Cardioversion N/A 12/14/2014    Procedure: CARDIOVERSION;  Surgeon: Dorothy Spark, MD;  Location: [facility] ENDOSCOPY;  Service: Cardiovascular;  Laterality: N/A;    Family History  Problem Relation Age of  Onset  . Asthma Father   . Heart attack Father   . Asthma Sister   . Cancer Sister     lung  . Diabetes    . Heart attack Mother   . Stroke Brother     Social History   Social History  . Marital Status: Single    Spouse Name: N/A  . Number of Children: N/A  . Years of Education: N/A   Social History Main Topics  . Smoking status: Never Smoker   . Smokeless tobacco: Never Used  . Alcohol Use: Yes     Comment: 3 glasses of wine 3-4 times per week  . Drug Use: None  . Sexual Activity: Not Asked   Other Topics Concern  . None   Social History Narrative   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      Home Situation: lives alone      Spiritual Beliefs: [place]      Lifestyle: no regular exercise; poor diet              Current outpatient prescriptions:  .  albuterol (PROVENTIL HFA;VENTOLIN HFA) 108 (90 BASE) MCG/ACT inhaler, Inhale 2 puffs into the lungs every 6 (six) hours as needed for wheezing or shortness of breath., Disp: 1 Inhaler, Rfl: 3 .  apixaban (ELIQUIS) 5 MG TABS tablet, Take 1 tablet (5 mg total) by mouth 2 (two) times daily., Disp: 60 tablet, Rfl: 11 .  atorvastatin (LIPITOR) 40 MG tablet, Take 1 tablet (40 mg total) by mouth daily., Disp: 90 tablet, Rfl: 3 .  benzonatate (TESSALON) 100 MG capsule, Take 1 capsule (100 mg total) by mouth 3 (three) times daily as needed for cough., Disp: 20 capsule, Rfl: 0 .  carvedilol (COREG) 25 MG tablet, Take 1 tablet (25 mg total) by mouth 2 (two) times daily with a meal., Disp: 180 tablet, Rfl: 3 .  furosemide (LASIX) 40 MG tablet, Take 1 tablet (40 mg total) by mouth 2 (two) times daily., Disp: 60 tablet, Rfl: 11 .  isosorbide-hydrALAZINE (BIDIL) 20-37.5 MG per tablet, Take 1 tablet by mouth 2 (two) times daily., Disp: 60 tablet, Rfl: 11 .  levothyroxine (SYNTHROID, LEVOTHROID) 175 MCG tablet, Take 1 tablet (175 mcg total) by mouth daily before breakfast., Disp: 30 tablet, Rfl: 3 .  nitroGLYCERIN  (NITROSTAT) 0.4 MG SL tablet, Place 0.4 mg under the tongue every 5 (five) minutes as needed for chest pain., Disp: , Rfl:  .  Potassium Chloride ER 20 MEQ TBCR, 1 tablet by mouth twice daily, Disp: 70 tablet, Rfl: 6 .  sacubitril-valsartan (ENTRESTO) 97-103 MG, Take 1 tablet by mouth 2 (two) times daily., Disp: 60 tablet, Rfl: 11 .  sertraline (ZOLOFT) 100 MG tablet, Take 1 tablet (100 mg total) by mouth daily., Disp: 90 tablet, Rfl: 1 .  spironolactone (ALDACTONE) 25 MG tablet, Take 1 tablet (25 mg total)  by mouth daily., Disp: 90 tablet, Rfl: 3 .  traZODone (DESYREL) 50 MG tablet, Take 1 tablet (50 mg total) by mouth at bedtime., Disp: 90 tablet, Rfl: 3 .  traMADol (ULTRAM) 50 MG tablet, 1/2 to 1 tablet as needed for severe pain not more then twice daily., Disp: 20 tablet, Rfl: 0  EXAM:  Filed Vitals:   08/30/15 1601  BP: 142/80  Pulse: 88  Temp: 98.3 F (36.8 C)    Body mass index is 31.03 kg/(m^2).  GENERAL: vitals reviewed and listed above, alert, oriented, appears well hydrated and in no acute distress  HEENT: atraumatic, conjunttiva clear, no obvious abnormalities on inspection of external nose and ears  NECK: no obvious masses on inspection  LUNGS: clear to auscultation bilaterally, no wheezes, rales or rhonchi, good air movement  CV: HRRR, SEM, no peripheral edema  MS: moves all extremities without noticeable abnormality, tenderness to palpation over the greater trochanteric bursa on the left, otherwise normal inspection and exam of the hip and lower leg without weakness, loss of sensitivity or gait abnormality  PSYCH: pleasant and cooperative, no obvious depression or anxiety  ASSESSMENT AND PLAN:  Discussed the following assessment and plan:  Trochanteric bursitis of left hip -ice, brief rest from daily walking, topical sports creams, HEP, tramadol prn after discussion risks/interactions and advised to use sparingly - she reports she has tolerated well in the  past  Other specified hypothyroidism - Plan: TSH -some fatigue the last few days  Chronic systolic heart failure ([place]) -follow up with cardiology   Controlled type 2 diabetes mellitus with chronic kidney disease, without long-term current use of insulin, unspecified CKD stage ([place]) -discussed again, implications and healthy diet/healthy lifestyle  Resistant hypertension -sees nephrologist -high on arrival with CMA, much improved after sitting on my check - she reports was good when sat nephrologist last wee -advised recheck with [name] or specialist in next few weeks, may be up due to pain  Chronic kidney disease, unspecified stage -sees nephrologist  Hyperlipemia -stable  Nonischemic cardiomyopathy ([place]) -sees cardiologist, follow up per recs  Obesity -lifestyle recs  Recurrent major depressive disorder, in full remission ([place]) -continue current tx  -Patient advised to return or notify a doctor immediately if symptoms worsen or persist or new concerns arise.  Patient Instructions  Before you leave: -Greater trochanteric bursitis exercises -lab to check thyroid test -Schedule follow up in Medicare annual wellness exam in 3 months  For the left hip pain: Do the exercises provided 4 days per week Ice several times per day; topical sports creams can be helpful. Can use the pain medication, tramadol, as little as possible or pain. Follow-up if persistent 4 weeks, sooner if worsening.  Schedule follow-up with her cardiologist. If you are not going to follow up with your cardiologist in the next 1 month, please schedule a blood pressure recheck with my nurse in the next few weeks.  We recommend the following healthy lifestyle measures: - eat a healthy whole foods diet consisting of regular small meals composed of vegetables, fruits, beans, nuts, seeds, healthy meats such as white chicken and fish and whole grains.  - avoid sweets, white starchy foods, fried foods, fast food,  processed foods, sodas, red meet and other fattening foods.  - get a least 150-300 minutes of aerobic exercise per week.   -We have ordered labs or studies at this visit. It can take up to 1-2 weeks for results and processing. We will contact you with instructions IF  your results are abnormal. Normal results will be released to your [facility]. If you have not heard from [name] or can not find your results in [facility] in 2 weeks please contact our office.              [name]

## 2015-08-30 NOTE — Progress Notes (Signed)
Pre visit review using our clinic review tool, if applicable. No additional management support is needed unless otherwise documented below in the visit note. 

## 2015-08-30 NOTE — Patient Instructions (Addendum)
Before you leave: -Greater trochanteric bursitis exercises -lab to check thyroid test -Schedule follow up in Medicare annual wellness exam in 3 months  For the left hip pain: Do the exercises provided 4 days per week Ice several times per day; topical sports creams can be helpful. Can use the pain medication, tramadol, as little as possible or pain. Follow-up if persistent 4 weeks, sooner if worsening.  Schedule follow-up with her cardiologist. If you are not going to follow up with your cardiologist in the next 1 month, please schedule a blood pressure recheck with my nurse in the next few weeks.  We recommend the following healthy lifestyle measures: - eat a healthy whole foods diet consisting of regular small meals composed of vegetables, fruits, beans, nuts, seeds, healthy meats such as white chicken and fish and whole grains.  - avoid sweets, white starchy foods, fried foods, fast food, processed foods, sodas, red meet and other fattening foods.  - get a least 150-300 minutes of aerobic exercise per week.   -We have ordered labs or studies at this visit. It can take up to 1-2 weeks for results and processing. We will contact you with instructions IF your results are abnormal. Normal results will be released to your [facility]. If you have not heard from [name] or can not find your results in [facility] in 2 weeks please contact our office.

## 2015-08-31 LAB — TSH: TSH: 3.7 u[IU]/mL (ref 0.35–4.50)

## 2015-09-07 ENCOUNTER — Telehealth: Payer: Self-pay

## 2015-09-07 NOTE — Telephone Encounter (Signed)
Attempted ICM intro call to patient.  Voice mail message left for return call.   Patient referred by Memory Dance, RN/Dr Caryl Comes.

## 2015-09-09 ENCOUNTER — Telehealth: Payer: Self-pay | Admitting: Cardiology

## 2015-09-09 ENCOUNTER — Telehealth: Payer: Self-pay

## 2015-09-09 NOTE — Telephone Encounter (Signed)
LMOVM for pt to return call 

## 2015-09-09 NOTE — Telephone Encounter (Signed)
Spoke with patient.

## 2015-09-09 NOTE — Telephone Encounter (Signed)
Received call back from patient.  Advised the device has reached ERI and will have scheduler call to set up appointment with Dr Caryl Comes to discuss procedure.   Explained she has the option of coming to the device clinic to have the auditory alert turned off or it can be turned off at appointment with Dr Caryl Comes.  She chose to wait until the office appointment with Dr Caryl Comes.    ICM intro given and she agreed to monthly follow ups.

## 2015-09-14 NOTE — Telephone Encounter (Signed)
2nd attempt  LMOVM for pt to return call.  

## 2015-09-15 NOTE — Telephone Encounter (Signed)
3rd attempt.   LMOVM for pt to return call. Will send schedulers a message to get patient an appt w/ a provider b/c pt device has reached recommended replacement time.

## 2015-09-16 NOTE — Telephone Encounter (Signed)
Spoke with patient and ERI had been discussed previously.  She has an appointment on 10/05/2015 to discuss device replacement.

## 2015-09-22 ENCOUNTER — Other Ambulatory Visit: Payer: Self-pay | Admitting: Family Medicine

## 2015-09-28 ENCOUNTER — Telehealth: Payer: Self-pay | Admitting: Family Medicine

## 2015-09-28 ENCOUNTER — Telehealth: Payer: Self-pay

## 2015-09-28 ENCOUNTER — Encounter: Payer: Self-pay | Admitting: Family Medicine

## 2015-09-28 ENCOUNTER — Ambulatory Visit (INDEPENDENT_AMBULATORY_CARE_PROVIDER_SITE_OTHER): Payer: Commercial Managed Care - HMO | Admitting: Family Medicine

## 2015-09-28 VITALS — BP 128/78 | HR 80 | Temp 98.2°F | Ht 59.0 in | Wt 152.7 lb

## 2015-09-28 DIAGNOSIS — I484 Atypical atrial flutter: Secondary | ICD-10-CM | POA: Insufficient documentation

## 2015-09-28 DIAGNOSIS — R040 Epistaxis: Secondary | ICD-10-CM

## 2015-09-28 DIAGNOSIS — Z7901 Long term (current) use of anticoagulants: Secondary | ICD-10-CM

## 2015-09-28 DIAGNOSIS — I10 Essential (primary) hypertension: Secondary | ICD-10-CM | POA: Diagnosis not present

## 2015-09-28 HISTORY — DX: Atypical atrial flutter: I48.4

## 2015-09-28 NOTE — Telephone Encounter (Signed)
Appointment with Dr. [name] today

## 2015-09-28 NOTE — Telephone Encounter (Signed)
Received call from patient stating she has had numerous nose bleeds since Sunday, 09/26/2015.  She had at least 3 bleeds on 09/26/2015 and 4 yesterday.  She had a difficult time stopping the bleeding.   She stated the last cardiologist she saw at the office was Richardson Dopp, [place].  She did not take Eliquis today.  She stated she has never had nose bleeds before these episodes.  Advised would consult with Richardson Dopp, PA and call her back.

## 2015-09-28 NOTE — Telephone Encounter (Signed)
[facility] - Client [place] [facility] Patient Name: Morgan Clay DOB: 06-08-1945 Initial Comment Caller states nose had bled all day yesterday, not today. Cardiologist advised her to stop eliquis and call PCP. Nurse Assessment Nurse: Dimas Chyle, RN, Dellis Filbert Date/Time Eilene Ghazi Time): 09/28/2015 2:33:49 PM Confirm and document reason for call. If symptomatic, describe symptoms. You must click the next button to save text entered. ---Caller states nose had bled all day yesterday, not today. Cardiologist advised her to stop eliquis and call PCP. Has the patient traveled out of the country within the last 30 days? ---No Does the patient have any new or worsening symptoms? ---Yes Will a triage be completed? ---Yes Related visit to physician within the last 2 weeks? ---No Does the PT have any chronic conditions? (i.e. diabetes, asthma, etc.) ---Yes List chronic conditions. ---Hx of DVT, HTN, pacemaker Is this a behavioral health or substance abuse call? ---No Guidelines Guideline Title Affirmed Question Affirmed Notes Nosebleed Taking Coumadin (warfarin) or other strong blood thinner, or known bleeding disorder (e.g., thrombocytopenia) Final Disposition User See Physician within 24 Hours Dimas Chyle, RN, [employer] Referrals REFERRED TO PCP OFFICE Disagree/Comply: Leta Baptist

## 2015-09-28 NOTE — Patient Instructions (Signed)
-  no nasal spray -follow up with cardiologist's recommendations regarding Eliquis -follow up as needed if recurrent or persistent nosebleeds or other concerns   Nosebleed Nosebleeds are common. They are due to a crack in the inside lining of your nose (mucous membrane) or from a small blood vessel that starts to bleed. Nosebleeds can be caused by many conditions, such as injury, infections, dry mucous membranes or dry climate, medicines, nose picking, and home heating and cooling systems. Most nosebleeds come from blood vessels in the front of your nose. HOME CARE INSTRUCTIONS   Try controlling your nosebleed by pinching your nostrils gently and continuously for at least 10 minutes.  Avoid blowing or sniffing your nose for a number of hours after having a nosebleed.  Do not put gauze inside your nose yourself. If your nose was packed by your health care provider, try to maintain the pack inside of your nose until your health care provider removes it.  If a gauze pack was used and it starts to fall out, gently replace it or cut off the end of it.  If a balloon catheter was used to pack your nose, do not cut or remove it unless your health care provider has instructed you to do that.  Avoid lying down while you are having a nosebleed. Sit up and lean forward.  Use a nasal spray decongestant to help with a nosebleed as directed by your health care provider.  Do not use petroleum jelly or mineral oil in your nose. These can drip into your lungs.  Maintain humidity in your home by using less air conditioning or by using a humidifier.  Aspirinand blood thinners make bleeding more likely. If you are prescribed these medicines and you suffer from nosebleeds, ask your health care provider if you should stop taking the medicines or adjust the dose. Do not stop medicines unless directed by your health care provider  Resume your normal activities as you are able, but avoid straining, lifting, or  bending at the waist for several days.  If your nosebleed was caused by dry mucous membranes, use over-the-counter saline nasal spray or gel. This will keep the mucous membranes moist and allow them to heal. If you must use a lubricant, choose the water-soluble variety. Use it only sparingly, and do not use it within several hours of lying down.  Keep all follow-up visits as directed by your health care provider. This is important. SEEK MEDICAL CARE IF:  You have a fever.  You get frequent nosebleeds.  You are getting nosebleeds more often. SEEK IMMEDIATE MEDICAL CARE IF:  Your nosebleed lasts longer than 20 minutes.  Your nosebleed occurs after an injury to your face, and your nose looks crooked or broken.  You have unusual bleeding from other parts of your body.  You have unusual bruising on other parts of your body.  You feel light-headed or you faint.  You become sweaty.  You vomit blood.  Your nosebleed occurs after a head injury.   This information is not intended to replace advice given to you by your health care provider. Make sure you discuss any questions you have with your health care provider.   Document Released: 02/01/2005 Document Revised: 05/15/2014 Document Reviewed: 12/08/2013 Elsevier Interactive Patient Education [employer].

## 2015-09-28 NOTE — Progress Notes (Signed)
HPI:  Morgan Clay is a pleasant 70 year old on Eliquis for a history of atrial flutter, here for an acute visit for nosebleeds. She has been using a nasal spray, she thinks a steroid nasal spray, over-the-counter for allergies recently. Several days ago she developed a nosebleed on the left. This did stop with pressure, but recurred several times. She called her cardiologist who told her to hold the Eliquis for 3 days and to see [name] for evaluation of the nosebleed. She has had no further bleeding today. Denies pain, bleeding that did not stop, bleeding elsewhere, headache, fevers, sinus pain or difficulty breathing.    ROS: See pertinent positives and negatives per HPI.  Past Medical History  Diagnosis Date  . LBBB (left bundle branch block)   . Nonischemic cardiomyopathy ([place])     Followed by [facility]  . Biventricular implantable cardiac defibrillator in situ 12/24/2006    Protecta- Medtronic  ICD gen change 2012  . Chronic systolic congestive heart failure ([place])   . Hypothyroidism   . Personal history of other infectious and parasitic disease     Hepatitis B  . FIBROIDS, UTERUS 03/05/2008    Qualifier: Diagnosis of  By: Isla Pence    . OBSTRUCTIVE SLEEP APNEA 11/14/2007    npsg 2009:  Mild osa with AHI 11/hr. Started cpap 2009, quit using due to lack of response Home sleep testing 09/2010 with AHI only 7/hr and weight loss advised by pulmonology.  . OSTEOPENIA 09/30/2008    Qualifier: Diagnosis of  By: Hassell Done FNP, Tori Milks    . OBESITY 05/27/2009    Qualifier: Diagnosis of  By: Hassell Done FNP, Tori Milks    . DEPRESSION 12/17/2009    Annotation: PHQ-9 score = 14 done on 12/17/2009 Qualifier: Diagnosis of  By: Hassell Done FNP, Tori Milks    . Hyperlipemia 12/06/2012  . HYPERTENSION, BENIGN 04/24/2007    Qualifier: Diagnosis of  By: Hassell Done FNP, Tori Milks    . Arthritis   . Glaucoma   . HEPATITIS C - s/p treatment with Harvoni, seeing hepatology, Dawn Drazek  01/04/2007    Qualifier:  Diagnosis of  By: Hassell Done FNP, Tori Milks    . Hx of cardiovascular stress test     Lexiscan Myoview (12/15): No ischemia, EF 33%, high risk due to depressed LV function   . Hx of echocardiogram     Echo (12/15): Severe LVH, EF 25-30%, diffuse HK, restrictive physiology, mild AI, mild MR, moderate LAE, mild RVE, mild RAE, moderate to severe TR, mild increased PASP  . Asthma     reports mild asthma since childhood - had COPD on dx list from prior PCP  . AICD (automatic cardioverter/defibrillator) present   . Atypical atrial flutter ([place]) 09/28/2015    Past Surgical History  Procedure Laterality Date  . Appendectomy    . Cardiac catheterization    . [id number]      Concerto 0000000  . Insert / replace / remove pacemaker      concerto  . Cardioversion N/A 12/14/2014    Procedure: CARDIOVERSION;  Surgeon: Dorothy Spark, MD;  [facility];  Service: Cardiovascular;  Laterality: N/A;    Family History  Problem Relation Age of Onset  . Asthma Father   . Heart attack Father   . Asthma Sister   . Cancer Sister     lung  . Diabetes    . Heart attack Mother   . Stroke Brother     Social History   Social History  . Marital  Status: Single    Spouse Name: N/A  . Number of Children: N/A  . Years of Education: N/A   Social History Main Topics  . Smoking status: Never Smoker   . Smokeless tobacco: Never Used  . Alcohol Use: Yes     Comment: 3 glasses of wine 3-4 times per week  . Drug Use: None  . Sexual Activity: Not Asked   Other Topics Concern  . None   Social History Narrative   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      Home Situation: lives alone      Spiritual Beliefs: [place]      Lifestyle: no regular exercise; poor diet              Current outpatient prescriptions:  .  albuterol (PROVENTIL HFA;VENTOLIN HFA) 108 (90 BASE) MCG/ACT inhaler, Inhale 2 puffs into the lungs every 6 (six) hours as needed for wheezing or shortness of breath.,  Disp: 1 Inhaler, Rfl: 3 .  apixaban (ELIQUIS) 5 MG TABS tablet, Take 1 tablet (5 mg total) by mouth 2 (two) times daily., Disp: 60 tablet, Rfl: 11 .  atorvastatin (LIPITOR) 40 MG tablet, Take 1 tablet (40 mg total) by mouth daily., Disp: 90 tablet, Rfl: 3 .  benzonatate (TESSALON) 100 MG capsule, Take 1 capsule (100 mg total) by mouth 3 (three) times daily as needed for cough., Disp: 20 capsule, Rfl: 0 .  carvedilol (COREG) 25 MG tablet, Take 1 tablet (25 mg total) by mouth 2 (two) times daily with a meal., Disp: 180 tablet, Rfl: 3 .  furosemide (LASIX) 40 MG tablet, Take 1 tablet (40 mg total) by mouth 2 (two) times daily., Disp: 60 tablet, Rfl: 11 .  isosorbide-hydrALAZINE (BIDIL) 20-37.5 MG per tablet, Take 1 tablet by mouth 2 (two) times daily., Disp: 60 tablet, Rfl: 11 .  levothyroxine (SYNTHROID, LEVOTHROID) 175 MCG tablet, TAKE ONE TABLET BY MOUTH ONCE DAILY BEFORE BREAKFAST, Disp: 30 tablet, Rfl: 5 .  nitroGLYCERIN (NITROSTAT) 0.4 MG SL tablet, Place 0.4 mg under the tongue every 5 (five) minutes as needed for chest pain., Disp: , Rfl:  .  Potassium Chloride ER 20 MEQ TBCR, 1 tablet by mouth twice daily, Disp: 70 tablet, Rfl: 6 .  sacubitril-valsartan (ENTRESTO) 97-103 MG, Take 1 tablet by mouth 2 (two) times daily., Disp: 60 tablet, Rfl: 11 .  sertraline (ZOLOFT) 100 MG tablet, Take 1 tablet (100 mg total) by mouth daily., Disp: 90 tablet, Rfl: 1 .  spironolactone (ALDACTONE) 25 MG tablet, Take 1 tablet (25 mg total) by mouth daily., Disp: 90 tablet, Rfl: 3 .  traMADol (ULTRAM) 50 MG tablet, 1/2 to 1 tablet as needed for severe pain not more then twice daily., Disp: 20 tablet, Rfl: 0 .  traZODone (DESYREL) 50 MG tablet, Take 1 tablet (50 mg total) by mouth at bedtime., Disp: 90 tablet, Rfl: 3  EXAM:  Filed Vitals:   09/28/15 1612  BP: 128/78  Pulse: 80  Temp: 98.2 F (36.8 C)    Body mass index is 30.83 kg/(m^2).  GENERAL: vitals reviewed and listed above, alert, oriented,  appears well hydrated and in no acute distress  HEENT: atraumatic, conjunttiva clear, no obvious abnormalities on inspection of external nose and ears, normal appearance of ear canals and TMs, clear nasal congestion, irritation of the nasal mucosa overlying the anterior septum in the left nose, no bleeding, mild post oropharyngeal erythema with PND, no tonsillar edema or exudate, no sinus TTP  NECK: no obvious masses on inspection  LUNGS: clear to auscultation bilaterally, no wheezes, rales or rhonchi, good air movement  CV: HRRR, no peripheral edema  MS: moves all extremities without noticeable abnormality  PSYCH: pleasant and cooperative, no obvious depression or anxiety  ASSESSMENT AND PLAN:  Discussed the following assessment and plan:  Nosebleed  On continuous oral anticoagulation  Resistant hypertension  Atypical atrial flutter ([place])  -We discussed possible serious and likely etiologies, workup and treatment, treatment risks and return precautions for nosebleeds likely occurred due to a combination of the eliquis and a nasal spray used improperly, the bleeding has now stopped -. Nasal spray -If needs to use intranasal steroid in the future, we advised her of how to use properly, directing away from the nasal septum -Also discussed how to stopping nosebleed if she should have recurrence -Advised to follow cardiology recommendations regarding eliquis in follow-up as needed -Blood pressure was high on arrival, but much better on recheck, she she reports she has follow-up with her cardiologist next week -of course, we advised [place]  to return or notify a doctor immediately if symptoms worsen or persist or new concerns arise.  .  -Patient advised to return or notify a doctor immediately if symptoms worsen or persist or new concerns arise.  Patient Instructions  -no nasal spray -follow up with cardiologist's recommendations regarding Eliquis -follow up as needed if  recurrent or persistent nosebleeds or other concerns   Nosebleed Nosebleeds are common. They are due to a crack in the inside lining of your nose (mucous membrane) or from a small blood vessel that starts to bleed. Nosebleeds can be caused by many conditions, such as injury, infections, dry mucous membranes or dry climate, medicines, nose picking, and home heating and cooling systems. Most nosebleeds come from blood vessels in the front of your nose. HOME CARE INSTRUCTIONS   Try controlling your nosebleed by pinching your nostrils gently and continuously for at least 10 minutes.  Avoid blowing or sniffing your nose for a number of hours after having a nosebleed.  Do not put gauze inside your nose yourself. If your nose was packed by your health care provider, try to maintain the pack inside of your nose until your health care provider removes it.  If a gauze pack was used and it starts to fall out, gently replace it or cut off the end of it.  If a balloon catheter was used to pack your nose, do not cut or remove it unless your health care provider has instructed you to do that.  Avoid lying down while you are having a nosebleed. Sit up and lean forward.  Use a nasal spray decongestant to help with a nosebleed as directed by your health care provider.  Do not use petroleum jelly or mineral oil in your nose. These can drip into your lungs.  Maintain humidity in your home by using less air conditioning or by using a humidifier.  Aspirinand blood thinners make bleeding more likely. If you are prescribed these medicines and you suffer from nosebleeds, ask your health care provider if you should stop taking the medicines or adjust the dose. Do not stop medicines unless directed by your health care provider  Resume your normal activities as you are able, but avoid straining, lifting, or bending at the waist for several days.  If your nosebleed was caused by dry mucous membranes, use  over-the-counter saline nasal spray or gel. This will keep the mucous membranes moist and  allow them to heal. If you must use a lubricant, choose the water-soluble variety. Use it only sparingly, and do not use it within several hours of lying down.  Keep all follow-up visits as directed by your health care provider. This is important. SEEK MEDICAL CARE IF:  You have a fever.  You get frequent nosebleeds.  You are getting nosebleeds more often. SEEK IMMEDIATE MEDICAL CARE IF:  Your nosebleed lasts longer than 20 minutes.  Your nosebleed occurs after an injury to your face, and your nose looks crooked or broken.  You have unusual bleeding from other parts of your body.  You have unusual bruising on other parts of your body.  You feel light-headed or you faint.  You become sweaty.  You vomit blood.  Your nosebleed occurs after a head injury.   This information is not intended to replace advice given to you by your health care provider. Make sure you discuss any questions you have with your health care provider.   Document Released: 02/01/2005 Document Revised: 05/15/2014 Document Reviewed: 12/08/2013 Elsevier Interactive Patient Education 2016 [place]

## 2015-09-28 NOTE — Progress Notes (Signed)
Pre visit review using our clinic review tool, if applicable. No additional management support is needed unless otherwise documented below in the visit note. 

## 2015-09-28 NOTE — Telephone Encounter (Signed)
Attempted call to patient at home number and she provided other number, [phone] but no voice mail was set up for cell number.

## 2015-09-28 NOTE — Telephone Encounter (Signed)
Discussed nosebleeds with Richardson Dopp, PA.  He reviewed chart and stated patient could hold Eliquis x 3 days and during that time should see PCP to determine why she is having nosebleed.  He requested I confirmed she does not have history of stroke.  Call back to patient.  Confirmed she does not have a history of stroke.  Advised per PA, Richardson Dopp, she can hold Eliquis for 3 days and make appointment with PCP during that time to investigate the nosebleeds.  Advised after 3 days, if no further nosebleeds than she may resume her prescribed dosage.  She verbalized understanding.

## 2015-09-28 NOTE — Telephone Encounter (Signed)
Attempted return call to patient.  She had left message that she was having nose bleeds and thought it was related to Eliquis.  No answer and left message for return call.

## 2015-10-01 ENCOUNTER — Encounter (HOSPITAL_COMMUNITY): Payer: Self-pay | Admitting: Emergency Medicine

## 2015-10-01 ENCOUNTER — Observation Stay (HOSPITAL_COMMUNITY)
Admission: EM | Admit: 2015-10-01 | Discharge: 2015-10-03 | Disposition: A | Discharge status: Home / Self Care | Payer: Commercial Managed Care - HMO | Attending: Family Medicine | Admitting: Family Medicine

## 2015-10-01 ENCOUNTER — Emergency Department (HOSPITAL_COMMUNITY): Payer: Commercial Managed Care - HMO

## 2015-10-01 DIAGNOSIS — J449 Chronic obstructive pulmonary disease, unspecified: Secondary | ICD-10-CM | POA: Diagnosis not present

## 2015-10-01 DIAGNOSIS — E039 Hypothyroidism, unspecified: Secondary | ICD-10-CM | POA: Diagnosis present

## 2015-10-01 DIAGNOSIS — F1290 Cannabis use, unspecified, uncomplicated: Secondary | ICD-10-CM | POA: Diagnosis not present

## 2015-10-01 DIAGNOSIS — B1920 Unspecified viral hepatitis C without hepatic coma: Secondary | ICD-10-CM | POA: Diagnosis not present

## 2015-10-01 DIAGNOSIS — R531 Weakness: Principal | ICD-10-CM | POA: Insufficient documentation

## 2015-10-01 DIAGNOSIS — E785 Hyperlipidemia, unspecified: Secondary | ICD-10-CM | POA: Diagnosis not present

## 2015-10-01 DIAGNOSIS — I484 Atypical atrial flutter: Secondary | ICD-10-CM | POA: Diagnosis present

## 2015-10-01 DIAGNOSIS — Z95810 Presence of automatic (implantable) cardiac defibrillator: Secondary | ICD-10-CM | POA: Diagnosis present

## 2015-10-01 DIAGNOSIS — I130 Hypertensive heart and chronic kidney disease with heart failure and stage 1 through stage 4 chronic kidney disease, or unspecified chronic kidney disease: Secondary | ICD-10-CM | POA: Diagnosis not present

## 2015-10-01 DIAGNOSIS — N183 Chronic kidney disease, stage 3 (moderate): Secondary | ICD-10-CM | POA: Insufficient documentation

## 2015-10-01 DIAGNOSIS — I5022 Chronic systolic (congestive) heart failure: Secondary | ICD-10-CM | POA: Diagnosis not present

## 2015-10-01 DIAGNOSIS — E669 Obesity, unspecified: Secondary | ICD-10-CM | POA: Diagnosis not present

## 2015-10-01 DIAGNOSIS — J45909 Unspecified asthma, uncomplicated: Secondary | ICD-10-CM | POA: Insufficient documentation

## 2015-10-01 DIAGNOSIS — R29898 Other symptoms and signs involving the musculoskeletal system: Secondary | ICD-10-CM | POA: Diagnosis present

## 2015-10-01 DIAGNOSIS — N184 Chronic kidney disease, stage 4 (severe): Secondary | ICD-10-CM | POA: Diagnosis present

## 2015-10-01 DIAGNOSIS — I160 Hypertensive urgency: Secondary | ICD-10-CM

## 2015-10-01 DIAGNOSIS — I5032 Chronic diastolic (congestive) heart failure: Secondary | ICD-10-CM | POA: Diagnosis present

## 2015-10-01 LAB — DIFFERENTIAL
BASOS ABS: 0 10*3/uL (ref 0.0–0.1)
BASOS PCT: 0 %
EOS ABS: 0.1 10*3/uL (ref 0.0–0.7)
Eosinophils Relative: 2 %
Lymphocytes Relative: 43 %
Lymphs Abs: 2 10*3/uL (ref 0.7–4.0)
Monocytes Absolute: 0.3 10*3/uL (ref 0.1–1.0)
Monocytes Relative: 7 %
Neutro Abs: 2.3 10*3/uL (ref 1.7–7.7)
Neutrophils Relative %: 48 %

## 2015-10-01 LAB — I-STAT CHEM 8, ED
BUN: 16 mg/dL (ref 6–20)
Calcium, Ion: 1.14 mmol/L (ref 1.13–1.30)
Chloride: 103 mmol/L (ref 101–111)
Creatinine, Ser: 1.3 mg/dL — ABNORMAL HIGH (ref 0.44–1.00)
Glucose, Bld: 122 mg/dL — ABNORMAL HIGH (ref 65–99)
HEMATOCRIT: 48 % — AB (ref 36.0–46.0)
HEMOGLOBIN: 16.3 g/dL — AB (ref 12.0–15.0)
Potassium: 3.7 mmol/L (ref 3.5–5.1)
SODIUM: 141 mmol/L (ref 135–145)
TCO2: 22 mmol/L (ref 0–100)

## 2015-10-01 LAB — CBC
HCT: 42.9 % (ref 36.0–46.0)
HEMOGLOBIN: 14.1 g/dL (ref 12.0–15.0)
MCH: 27.9 pg (ref 26.0–34.0)
MCHC: 32.9 g/dL (ref 30.0–36.0)
MCV: 85 fL (ref 78.0–100.0)
PLATELETS: 167 10*3/uL (ref 150–400)
RBC: 5.05 MIL/uL (ref 3.87–5.11)
RDW: 15.7 % — AB (ref 11.5–15.5)
WBC: 4.7 10*3/uL (ref 4.0–10.5)

## 2015-10-01 LAB — COMPREHENSIVE METABOLIC PANEL
ALBUMIN: 3.9 g/dL (ref 3.5–5.0)
ALT: 17 U/L (ref 14–54)
ANION GAP: 9 (ref 5–15)
AST: 23 U/L (ref 15–41)
Alkaline Phosphatase: 76 U/L (ref 38–126)
BUN: 13 mg/dL (ref 6–20)
CHLORIDE: 104 mmol/L (ref 101–111)
CO2: 23 mmol/L (ref 22–32)
Calcium: 9.4 mg/dL (ref 8.9–10.3)
Creatinine, Ser: 1.32 mg/dL — ABNORMAL HIGH (ref 0.44–1.00)
GFR calc Af Amer: 47 mL/min — ABNORMAL LOW (ref >60)
GFR calc non Af Amer: 41 mL/min — ABNORMAL LOW (ref >60)
GLUCOSE: 125 mg/dL — AB (ref 65–99)
POTASSIUM: 3.6 mmol/L (ref 3.5–5.1)
Sodium: 136 mmol/L (ref 135–145)
Total Bilirubin: 0.5 mg/dL (ref 0.3–1.2)
Total Protein: 8.1 g/dL (ref 6.5–8.1)

## 2015-10-01 LAB — I-STAT TROPONIN, ED: TROPONIN I, POC: 0.04 ng/mL (ref 0.00–0.08)

## 2015-10-01 LAB — APTT: APTT: 37 s (ref 24–37)

## 2015-10-01 LAB — PROTIME-INR
INR: 1.22 (ref 0.00–1.49)
PROTHROMBIN TIME: 15.6 s — AB (ref 11.6–15.2)

## 2015-10-01 MED ORDER — LABETALOL HCL 5 MG/ML IV SOLN
20.0000 mg | Freq: Once | INTRAVENOUS | Status: AC
  Administered 2015-10-01: 20 mg via INTRAVENOUS
  Filled 2015-10-01: qty 4

## 2015-10-01 NOTE — ED Notes (Signed)
Pt pacemaker interrogated.

## 2015-10-01 NOTE — ED Provider Notes (Signed)
CSN: [id number]     Arrival date & time 10/01/15  2128 History   First MD Initiated Contact with Patient 10/01/15 2128     Chief Complaint  Patient presents with  . Hypertension     (Consider location/radiation/quality/duration/timing/severity/associated sxs/prior  Treatment) HPI  Patient has significant cardiac PMH including a. Flutter, CHF,  Medtronic ICD, hypertension, hypothyroidism and multiple other medical problems.  She reports feeling numbness and weakness in her left arm and became concerned that her BP might be elevated around 8 pm this evening. She had not yet taken her night time BP medications of lasix, hydralazine, or Coreg she went to the local [employer], checked her BP and it was elevated to 223/105.   She used to take Eliquis but it was recently discontinued. She denies having HA, CP, SOB, dizziness. Negative stroke screen. She endorses weakness to the left arm but no weakness appreciated in triage.   Past Medical History  Diagnosis Date  . LBBB (left bundle branch block)   . Nonischemic cardiomyopathy ([place])     Followed by [facility]  . Biventricular implantable cardiac defibrillator in situ 12/24/2006    Protecta- Medtronic  ICD gen change 2012  . Chronic systolic congestive heart failure ([place])   . Hypothyroidism   . Personal history of other infectious and parasitic disease     Hepatitis B  . FIBROIDS, UTERUS 03/05/2008    Qualifier: Diagnosis of  By: Isla Pence    . OBSTRUCTIVE SLEEP APNEA 11/14/2007    npsg 2009:  Mild osa with AHI 11/hr. Started cpap 2009, quit using due to lack of response Home sleep testing 09/2010 with AHI only 7/hr and weight loss advised by pulmonology.  . OSTEOPENIA 09/30/2008    Qualifier: Diagnosis of  By: Hassell Done FNP, Tori Milks    . OBESITY 05/27/2009    Qualifier: Diagnosis of  By: Hassell Done FNP, Tori Milks    . DEPRESSION 12/17/2009    Annotation: PHQ-9 score = 14 done on 12/17/2009 Qualifier: Diagnosis of  By: Hassell Done FNP,  Tori Milks    . Hyperlipemia 12/06/2012  . HYPERTENSION, BENIGN 04/24/2007    Qualifier: Diagnosis of  By: Hassell Done FNP, Tori Milks    . Arthritis   . Glaucoma   . HEPATITIS C - s/p treatment with Harvoni, seeing hepatology, Dawn Drazek  01/04/2007    Qualifier: Diagnosis of  By: Hassell Done FNP, Tori Milks    . Hx of cardiovascular stress test     Lexiscan Myoview (12/15): No ischemia, EF 33%, high risk due to depressed LV function   . Hx of echocardiogram     Echo (12/15): Severe LVH, EF 25-30%, diffuse HK, restrictive physiology, mild AI, mild MR, moderate LAE, mild RVE, mild RAE, moderate to severe TR, mild increased PASP  . Asthma     reports mild asthma since childhood - had COPD on dx list from prior PCP  . AICD (automatic cardioverter/defibrillator) present   . Atypical atrial flutter ([place]) 09/28/2015   Past Surgical History  Procedure Laterality Date  . Appendectomy    . Cardiac catheterization    . [id number]      Concerto 0000000  . Insert / replace / remove pacemaker      concerto  . Cardioversion N/A 12/14/2014    Procedure: CARDIOVERSION;  Surgeon: Dorothy Spark, MD;  Location: [facility] END[facility];  Service: Cardiovascular;  Laterality: N/A;   Family History  Problem Relation Age of Onset  . Asthma Father   . Heart attack Father   .  Asthma Sister   . Cancer Sister     lung  . Diabetes    . Heart attack Mother   . Stroke Brother    Social History  Substance Use Topics  . Smoking status: Never Smoker   . Smokeless tobacco: Never Used  . Alcohol Use: Yes     Comment: 3 glasses of wine 3-4 times per week   OB History    No data available     Review of Systems  Review of Systems All other systems negative except as documented in the HPI. All pertinent positives and negatives as reviewed in the HPI.   Allergies  Review of patient's allergies indicates no known allergies.  Home Medications   Prior to Admission medications   Medication Sig Start Date End Date Taking?  Authorizing Provider  albuterol (PROVENTIL HFA;VENTOLIN HFA) 108 (90 BASE) MCG/ACT inhaler Inhale 2 puffs into the lungs every 6 (six) hours as needed for wheezing or shortness of breath. 01/01/15  Yes [name], DO  carvedilol (COREG) 25 MG tablet Take 1 tablet (25 mg total) by mouth 2 (two) times daily with a meal. 05/21/13  Yes Wellington Hampshire, MD  furosemide (LASIX) 40 MG tablet Take 1 tablet (40 mg total) by mouth 2 (two) times daily. 01/05/15  Yes [name], MD  isosorbide-hydrALAZINE (BIDIL) 20-37.5 MG per tablet Take 1 tablet by mouth 2 (two) times daily. 01/05/15  Yes [name], MD  levothyroxine (SYNTHROID, LEVOTHROID) 175 MCG tablet TAKE ONE TABLET BY MOUTH ONCE DAILY BEFORE BREAKFAST 09/22/15  Yes Eulas Post, MD  nitroGLYCERIN (NITROSTAT) 0.4 MG SL tablet Place 0.4 mg under the tongue every 5 (five) minutes as needed for chest pain.   Yes Historical Provider, MD  sacubitril-valsartan (ENTRESTO) 97-103 MG Take 1 tablet by mouth 2 (two) times daily. 01/05/15  Yes [name], MD  sertraline (ZOLOFT) 100 MG tablet Take 1 tablet (100 mg total) by mouth daily. 06/30/14  Yes [name], DO  traMADol (ULTRAM) 50 MG tablet 1/2 to 1 tablet as needed for severe pain not more then twice daily. Patient taking differently: Take 50 mg by mouth every 6 (six) hours as needed for moderate pain.  08/30/15  Yes [name], DO  traZODone (DESYREL) 50 MG tablet Take 1 tablet (50 mg total) by mouth at bedtime. 06/30/15  Yes [name], DO  apixaban (ELIQUIS) 5 MG TABS tablet Take 1 tablet (5 mg total) by mouth 2 (two) times daily. 10/19/14   Scott T Weaver, PA-C   BP 158/71 mmHg  Pulse 63  Temp(Src) 98.5 F (36.9 C) (Oral)  Resp 22  SpO2 95% Physical Exam  Constitutional: She appears well-developed and well-nourished. No distress.  HENT:  Head: Normocephalic and atraumatic.  Eyes: Conjunctivae and EOM are normal. Pupils are equal, round, and reactive to light. No scleral icterus.   Neck: Normal range of motion. Neck supple.  Cardiovascular: Normal rate, regular rhythm, normal heart sounds and intact distal pulses.   Pulmonary/Chest: Effort normal. No respiratory distress. She has no wheezes. She has no rales. She exhibits no tenderness.  Abdominal: Soft. There is no tenderness.  Musculoskeletal: She exhibits no edema or tenderness.  Neurological: She is alert. She displays a negative Romberg sign.  No facial paralysis or slurring of speech, patient is alert and oriented to self. Unremarkable cranial nerves. Symmetrical upper and lower extremity strengths.  Skin: Skin is warm and dry. No petechiae, no purpura  and no rash noted. She is not diaphoretic.  Psychiatric: Her speech is not slurred. Cognition and memory are not impaired.  Nursing note and vitals reviewed.   ED Course  Procedures (including critical care time) Labs Review Labs Reviewed  PROTIME-INR - Abnormal; Notable for the following:    Prothrombin Time 15.6 (*)    All other components within normal limits  CBC - Abnormal; Notable for the following:    RDW 15.7 (*)    All other components within normal limits  COMPREHENSIVE METABOLIC PANEL - Abnormal; Notable for the following:    Glucose, Bld 125 (*)    Creatinine, Ser 1.32 (*)    GFR calc non Af Amer 41 (*)    GFR calc Af Amer 47 (*)    All other components within normal limits  I-STAT CHEM 8, ED - Abnormal; Notable for the following:    Creatinine, Ser 1.30 (*)    Glucose, Bld 122 (*)    Hemoglobin 16.3 (*)    HCT 48.0 (*)    All other components within normal limits  APTT  DIFFERENTIAL  URINE RAPID DRUG SCREEN, HOSP PERFORMED  URINALYSIS, ROUTINE W REFLEX MICROSCOPIC (NOT AT [facility])  Randolm Idol, ED    Imaging Review Dg Chest 2 View  10/01/2015  CLINICAL DATA:  Hypertension and left arm numbness x 1 day EXAM: CHEST  2 VIEW COMPARISON:  04/15/2014. FINDINGS: Lateral view degraded by patient arm position. Pacer/AICD device. Midline  trachea. Mild cardiomegaly. Atherosclerosis in the transverse aorta. No pleural effusion or pneumothorax. Pulmonary interstitial prominence is lower lobe predominant. No lobar consolidation. IMPRESSION: Cardiomegaly with suspicion of mild pulmonary venous congestion. Electronically Signed   By: [name] M.D.   On: 10/01/2015 22:52   I have personally reviewed and evaluated these images and lab results as part of my medical decision-making.   EKG Interpretation  Date/Time:  Friday Oct 01 2015 21:38:28 EDT Ventricular Rate:  66 PR Interval:    QRS Duration: 130 QT Interval:  456 QTC Calculation: 478 R Axis:   -25 Text Interpretation:  Atrial flutter with predominant 4:1 AV block LVH  with secondary repolarization abnormality Anterior Q waves, possibly due  to LVH No significant change since last tracing Confirmed by Lonia Skinner  [id number]) on 10/01/2015 9:43:38 PM      MDM   Final diagnoses:  Hypertensive urgency    Discussed case with Dr. Alfonse Spruce. Patient given 20 mg IV Labetalol, BP has improved to 170/76. She has a negative Trop, CBC w/dif unremarkable, CMP shows no significant abnormalities. Chest xray shows mild pulmonary venous congestion. EKG shows atrial flutter,   12:29 am; CT is backed up, patient still waiting for head CT. Her symptoms of left numbness resolved with improvement of blood pressure. Per Dr. Alfonse Spruce, if patient continues to be asymptomatic and her results are are normal and BP continues to improve than she can go home with follow-up with her PCP.  Per Medtronic review;  the device battery is becoming low and needs to be replaced. Otherwise, no observations were noted.  2:12 am Patient still having sensation of arm feeling heavy (L) she has no neurological deficits and no weakness is appreciated to the arm.  Dr. Tamala Julian, R. With [facility] hospitalist, [facility] has agreed to admit patient. Declines temp admit orders at this time.  Delos Haring, PA-C 10/02/15  [id number]  Harvel Quale, MD 10/03/15 1556

## 2015-10-01 NOTE — ED Notes (Signed)
Per [facility], c/o numbness in L arm, went to fire dept to check BP. Pt very hypertensive. Last BP 223/105. Hx of HTN, CHF, pacemaker. Pt taken off eloquis five days ago. Pt taking htn meds x2 per day, pt taking medications as prescribed. Denies HA, denies CP, Denies SOB, denies dizziness, stroke screen is negative by EMS. Pt ekg shows paced rhythm. Per ems, pt states her pacer is not working like its supposed to.

## 2015-10-01 NOTE — ED Notes (Signed)
Pt transported to xray 

## 2015-10-01 NOTE — ED Notes (Signed)
Delos Haring PA at bedside

## 2015-10-02 ENCOUNTER — Emergency Department (HOSPITAL_COMMUNITY): Payer: Commercial Managed Care - HMO

## 2015-10-02 DIAGNOSIS — Z95810 Presence of automatic (implantable) cardiac defibrillator: Secondary | ICD-10-CM | POA: Diagnosis not present

## 2015-10-02 DIAGNOSIS — E038 Other specified hypothyroidism: Secondary | ICD-10-CM

## 2015-10-02 DIAGNOSIS — R29898 Other symptoms and signs involving the musculoskeletal system: Secondary | ICD-10-CM | POA: Diagnosis present

## 2015-10-02 DIAGNOSIS — N183 Chronic kidney disease, stage 3 (moderate): Secondary | ICD-10-CM | POA: Diagnosis not present

## 2015-10-02 DIAGNOSIS — I484 Atypical atrial flutter: Secondary | ICD-10-CM | POA: Diagnosis not present

## 2015-10-02 DIAGNOSIS — I5022 Chronic systolic (congestive) heart failure: Secondary | ICD-10-CM

## 2015-10-02 LAB — URINALYSIS, ROUTINE W REFLEX MICROSCOPIC
Bilirubin Urine: NEGATIVE
Glucose, UA: NEGATIVE mg/dL
Ketones, ur: NEGATIVE mg/dL
LEUKOCYTES UA: NEGATIVE
NITRITE: NEGATIVE
PH: 5.5 (ref 5.0–8.0)
Protein, ur: >300 mg/dL — AB
SPECIFIC GRAVITY, URINE: 1.016 (ref 1.005–1.030)

## 2015-10-02 LAB — TROPONIN I
TROPONIN I: 0.06 ng/mL — AB (ref <0.031)
Troponin I: 0.06 ng/mL — ABNORMAL HIGH (ref <0.031)
Troponin I: 0.05 ng/mL — ABNORMAL HIGH (ref <0.031)
Troponin I: 0.05 ng/mL — ABNORMAL HIGH (ref <0.031)

## 2015-10-02 LAB — LIPID PANEL
CHOL/HDL RATIO: 2.2 ratio
Cholesterol: 103 mg/dL (ref 0–200)
HDL: 46 mg/dL (ref >40)
LDL CALC: 40 mg/dL (ref 0–99)
Triglycerides: 86 mg/dL (ref <150)
VLDL: 17 mg/dL (ref 0–40)

## 2015-10-02 LAB — RAPID URINE DRUG SCREEN, HOSP PERFORMED
Amphetamines: NOT DETECTED
Barbiturates: NOT DETECTED
Benzodiazepines: NOT DETECTED
Cocaine: NOT DETECTED
OPIATES: NOT DETECTED
Tetrahydrocannabinol: POSITIVE — AB

## 2015-10-02 LAB — URINE MICROSCOPIC-ADD ON

## 2015-10-02 LAB — TSH: TSH: 1.603 u[IU]/mL (ref 0.350–4.500)

## 2015-10-02 LAB — MAGNESIUM: MAGNESIUM: 1.9 mg/dL (ref 1.7–2.4)

## 2015-10-02 MED ORDER — SENNOSIDES-DOCUSATE SODIUM 8.6-50 MG PO TABS: 1.0000 | ORAL_TABLET | Freq: Every evening | ORAL | Status: DC | PRN

## 2015-10-02 MED ORDER — ISOSORB DINITRATE-HYDRALAZINE 20-37.5 MG PO TABS
1.0000 | ORAL_TABLET | Freq: Once | ORAL | Status: AC
  Administered 2015-10-02: 1 via ORAL
  Filled 2015-10-02: qty 1

## 2015-10-02 MED ORDER — LEVOTHYROXINE SODIUM 175 MCG PO TABS: 175.0000 ug | ORAL_TABLET | Freq: Every day | ORAL | Status: DC

## 2015-10-02 MED ORDER — LEVOTHYROXINE SODIUM 75 MCG PO TABS
175.0000 ug | ORAL_TABLET | Freq: Every day | ORAL | Status: DC
  Administered 2015-10-02 – 2015-10-03 (×2): 175 ug via ORAL
  Filled 2015-10-02 (×2): qty 1

## 2015-10-02 MED ORDER — TRAMADOL HCL 50 MG PO TABS
50.0000 mg | ORAL_TABLET | Freq: Four times a day (QID) | ORAL | Status: DC | PRN
  Administered 2015-10-02: 50 mg via ORAL
  Filled 2015-10-02: qty 1

## 2015-10-02 MED ORDER — TRAMADOL HCL 50 MG PO TABS
100.0000 mg | ORAL_TABLET | Freq: Four times a day (QID) | ORAL | Status: DC | PRN
  Administered 2015-10-02: 100 mg via ORAL
  Filled 2015-10-02: qty 2

## 2015-10-02 MED ORDER — SACUBITRIL-VALSARTAN 97-103 MG PO TABS
1.0000 | ORAL_TABLET | Freq: Two times a day (BID) | ORAL | Status: DC
  Administered 2015-10-02 – 2015-10-03 (×3): 1 via ORAL
  Filled 2015-10-02 (×4): qty 1

## 2015-10-02 MED ORDER — APIXABAN 5 MG PO TABS
5.0000 mg | ORAL_TABLET | Freq: Two times a day (BID) | ORAL | Status: DC
  Administered 2015-10-02 – 2015-10-03 (×3): 5 mg via ORAL
  Filled 2015-10-02 (×3): qty 1

## 2015-10-02 MED ORDER — NITROGLYCERIN 0.4 MG SL SUBL: 0.4000 mg | SUBLINGUAL_TABLET | SUBLINGUAL | Status: DC | PRN

## 2015-10-02 MED ORDER — ALBUTEROL SULFATE (2.5 MG/3ML) 0.083% IN NEBU: 2.5000 mg | INHALATION_SOLUTION | Freq: Four times a day (QID) | RESPIRATORY_TRACT | Status: DC | PRN

## 2015-10-02 MED ORDER — ISOSORB DINITRATE-HYDRALAZINE 20-37.5 MG PO TABS
1.0000 | ORAL_TABLET | Freq: Two times a day (BID) | ORAL | Status: DC
  Administered 2015-10-02 – 2015-10-03 (×3): 1 via ORAL
  Filled 2015-10-02 (×4): qty 1

## 2015-10-02 MED ORDER — CARVEDILOL 12.5 MG PO TABS
25.0000 mg | ORAL_TABLET | Freq: Two times a day (BID) | ORAL | Status: DC
  Administered 2015-10-02 (×2): 25 mg via ORAL
  Filled 2015-10-02 (×4): qty 2

## 2015-10-02 MED ORDER — STROKE: EARLY STAGES OF RECOVERY BOOK
Freq: Once | Status: AC
  Administered 2015-10-02: 19:00:00
  Filled 2015-10-02: qty 1

## 2015-10-02 MED ORDER — SODIUM CHLORIDE 0.9 % IV SOLN
INTRAVENOUS | Status: DC
  Administered 2015-10-02: 11:00:00 via INTRAVENOUS

## 2015-10-02 MED ORDER — FUROSEMIDE 40 MG PO TABS
40.0000 mg | ORAL_TABLET | Freq: Two times a day (BID) | ORAL | Status: DC
  Administered 2015-10-02 – 2015-10-03 (×3): 40 mg via ORAL
  Filled 2015-10-02 (×3): qty 1

## 2015-10-02 MED ORDER — TRAZODONE HCL 50 MG PO TABS
50.0000 mg | ORAL_TABLET | Freq: Every day | ORAL | Status: DC
  Administered 2015-10-02: 50 mg via ORAL
  Filled 2015-10-02: qty 1

## 2015-10-02 MED ORDER — SERTRALINE HCL 100 MG PO TABS
100.0000 mg | ORAL_TABLET | Freq: Every day | ORAL | Status: DC
  Administered 2015-10-02 – 2015-10-03 (×2): 100 mg via ORAL
  Filled 2015-10-02 (×2): qty 1

## 2015-10-02 MED ORDER — LABETALOL HCL 5 MG/ML IV SOLN: 10.0000 mg | INTRAVENOUS | Status: DC | PRN

## 2015-10-02 NOTE — Discharge Instructions (Signed)

## 2015-10-02 NOTE — ED Notes (Signed)
Report attempted, RN to call back. 

## 2015-10-02 NOTE — ED Notes (Signed)
Pt resting quietly with eyes closed.  Continues to wait for CT scan

## 2015-10-02 NOTE — ED Notes (Signed)
Pt to CT at this time.

## 2015-10-02 NOTE — H&P (Signed)
History and Physical    Morgan Clay [name] DOB: May 01, 1946 DOA: 10/01/2015  Referring MD/NP/PA: Delos Haring, PA  PCP: [name]., DO  Patient coming from: Home  Chief Complaint: Left arm heaviness  HPI: Morgan Clay is a 70 y.o. female with medical history significant of HTN, HLD,hypothyroidism, chronic atrial flutter on chronic anticoagulation,  nonischemic cardiomyopathy, CHF EF 50-55% in 12/2014, s/p AICD; who presents with complaints of left arm heaviness since around 8 PM last night. Symptoms occurred all of a sudden and have been constant. Associated symptoms include generalized achiness feeling to her left arm. Denies ever having similar symptoms like this in the past. Denies having any focal left arm weakness, changes in vision, chest pain, changes in weight, increase in baseline shortness of breath symptoms. Patient notes that she was around other people smoking marijuana prior to coming in to the hospital, but did not admit to smoking marijuana herself. As time has gone on the symptoms of left arm heaviness have somewhat improved her arm does not feel as heavy as it did initially.  ED Course: Upon admission to the emergency department patient was seen to be afebrile, pulse of 277, respirations of 227, blood pressure up to 230/101, O2 saturations maintained on room air. Initial lab work was relatively unremarkable except for creatinine of 1.3, troponin negative, Ionized calcium within normal limits. UDS positive for marijuana. CT of the brain revealing no acute abnormality except for scattered small vessel ischemic microangiopathy.   Review of Systems: As per HPI otherwise 10 point review of systems negative.   Past Medical History  Diagnosis Date  . LBBB (left bundle branch block)   . Nonischemic cardiomyopathy ([place])     Followed by [facility]  . Biventricular implantable cardiac defibrillator in situ 12/24/2006    Protecta- [employer]  ICD gen change 2012    . Chronic systolic congestive heart failure ([place])   . Hypothyroidism   . Personal history of other infectious and parasitic disease     Hepatitis B  . FIBROIDS, UTERUS 03/05/2008    Qualifier: Diagnosis of  By: Isla Pence    . OBSTRUCTIVE SLEEP APNEA 11/14/2007    npsg 2009:  Mild osa with AHI 11/hr. Started cpap 2009, quit using due to lack of response Home sleep testing 09/2010 with AHI only 7/hr and weight loss advised by pulmonology.  . OSTEOPENIA 09/30/2008    Qualifier: Diagnosis of  By: Hassell Done FNP, Tori Milks    . OBESITY 05/27/2009    Qualifier: Diagnosis of  By: Hassell Done FNP, Tori Milks    . DEPRESSION 12/17/2009    Annotation: PHQ-9 score = 14 done on 12/17/2009 Qualifier: Diagnosis of  By: Hassell Done FNP, Tori Milks    . Hyperlipemia 12/06/2012  . HYPERTENSION, BENIGN 04/24/2007    Qualifier: Diagnosis of  By: Hassell Done FNP, Tori Milks    . Arthritis   . Glaucoma   . HEPATITIS C - s/p treatment with Harvoni, seeing hepatology, Dawn Drazek  01/04/2007    Qualifier: Diagnosis of  By: Hassell Done FNP, Tori Milks    . Hx of cardiovascular stress test     Lexiscan Myoview (12/15): No ischemia, EF 33%, high risk due to depressed LV function   . Hx of echocardiogram     Echo (12/15): Severe LVH, EF 25-30%, diffuse HK, restrictive physiology, mild AI, mild MR, moderate LAE, mild RVE, mild RAE, moderate to severe TR, mild increased PASP  . Asthma     reports mild asthma since childhood - had COPD on  dx list from prior PCP  . AICD (automatic cardioverter/defibrillator) present   . Atypical atrial flutter ([place]) 09/28/2015    Past Surgical History  Procedure Laterality Date  . Appendectomy    . Cardiac catheterization    . [id number]      Concerto [id number]  . Insert / replace / remove pacemaker      concerto  . Cardioversion N/A 12/14/2014    Procedure: CARDIOVERSION;  Surgeon: Dorothy Spark, MD;  Location: [facility] END[facility];  Service: Cardiovascular;  Laterality: N/A;     reports that she has never smoked.  She has never used smokeless tobacco. She reports that she drinks alcohol. Her drug history is not on file.  No Known Allergies  Family History  Problem Relation Age of Onset  . Asthma Father   . Heart attack Father   . Asthma Sister   . Cancer Sister     lung  . Diabetes    . Heart attack Mother   . Stroke Brother     Prior to Admission medications   Medication Sig Start Date End Date Taking? Authorizing Provider  albuterol (PROVENTIL HFA;VENTOLIN HFA) 108 (90 BASE) MCG/ACT inhaler Inhale 2 puffs into the lungs every 6 (six) hours as needed for wheezing or shortness of breath. 01/01/15  Yes [name], DO  carvedilol (COREG) 25 MG tablet Take 1 tablet (25 mg total) by mouth 2 (two) times daily with a meal. 05/21/13  Yes Wellington Hampshire, MD  furosemide (LASIX) 40 MG tablet Take 1 tablet (40 mg total) by mouth 2 (two) times daily. 01/05/15  Yes [name], MD  isosorbide-hydrALAZINE (BIDIL) 20-37.5 MG per tablet Take 1 tablet by mouth 2 (two) times daily. 01/05/15  Yes [name], MD  levothyroxine (SYNTHROID, LEVOTHROID) 175 MCG tablet TAKE ONE TABLET BY MOUTH ONCE DAILY BEFORE BREAKFAST 09/22/15  Yes Eulas Post, MD  nitroGLYCERIN (NITROSTAT) 0.4 MG SL tablet Place 0.4 mg under the tongue every 5 (five) minutes as needed for chest pain.   Yes Historical Provider, MD  sacubitril-valsartan (ENTRESTO) 97-103 MG Take 1 tablet by mouth 2 (two) times daily. 01/05/15  Yes [name], MD  sertraline (ZOLOFT) 100 MG tablet Take 1 tablet (100 mg total) by mouth daily. 06/30/14  Yes [name], DO  traMADol (ULTRAM) 50 MG tablet 1/2 to 1 tablet as needed for severe pain not more then twice daily. Patient taking differently: Take 50 mg by mouth every 6 (six) hours as needed for moderate pain.  08/30/15  Yes [name], DO  traZODone (DESYREL) 50 MG tablet Take 1 tablet (50 mg total) by mouth at bedtime. 06/30/15  Yes [name], DO  apixaban (ELIQUIS) 5 MG TABS tablet Take 1  tablet (5 mg total) by mouth 2 (two) times daily. 10/19/14   Liliane Shi, PA-C    Physical Exam: Filed Vitals:   10/02/15 0200 10/02/15 0215 10/02/15 0230 10/02/15 0245  BP: 150/68 169/58 153/72 145/63  Pulse: 64 74 76 64  Temp:      TempSrc:      Resp: 22 19 24 27   SpO2: 98% 96% 95% 97%      Constitutional: NAD, calm, comfortable Filed Vitals:   10/02/15 0200 10/02/15 0215 10/02/15 0230 10/02/15 0245  BP: 150/68 169/58 153/72 145/63  Pulse: 64 74 76 64  Temp:      TempSrc:      Resp: 22 19 24 27   SpO2: 98% 96%  95% 97%   Eyes: PERRL, lids and conjunctivae normal ENMT: Mucous membranes are moist. Posterior pharynx clear of any exudate or lesions.Normal dentition.  Neck: normal, supple, no masses, no thyromegaly Respiratory: clear to auscultation bilaterally, no wheezing, no crackles. Normal respiratory effort. No accessory muscle use.  Cardiovascular: Regular rate, no murmurs / rubs / gallops. No extremity edema. 2+ pedal pulses. No carotid bruits.  Abdomen: no tenderness, no masses palpated. No hepatosplenomegaly. Bowel sounds positive.  Musculoskeletal: no clubbing / cyanosis. No joint deformity upper and lower extremities. Good ROM, no contractures. Normal muscle tone.  Skin: no rashes, lesions, ulcers. No induration Neurologic: CN 2-12 grossly intact. Sensation intact, DTR normal. Strength 5/5 in all 4.  Psychiatric: Normal judgment and insight. Alert and oriented x 3. Normal mood.     Labs on Admission: I have personally reviewed following labs and imaging studies  CBC:  Recent Labs Lab 10/01/15 2212 10/01/15 2221  WBC 4.7  --   NEUTROABS 2.3  --   HGB 14.1 16.3*  HCT 42.9 48.0*  MCV 85.0  --   PLT 167  --    Basic Metabolic Panel:  Recent Labs Lab 10/01/15 2212 10/01/15 2221  NA 136 141  K 3.6 3.7  CL 104 103  CO2 23  --   GLUCOSE 125* 122*  BUN 13 16  CREATININE 1.32* 1.30*  CALCIUM 9.4  --    GFR: Estimated Creatinine Clearance: 35.5  mL/min (by C-G formula based on Cr of 1.3). Liver Function Tests:  Recent Labs Lab 10/01/15 2212  AST 23  ALT 17  ALKPHOS 76  BILITOT 0.5  PROT 8.1  ALBUMIN 3.9   No results for input(s): LIPASE, AMYLASE in the last 168 hours. No results for input(s): AMMONIA in the last 168 hours. Coagulation Profile:  Recent Labs Lab 10/01/15 2212  INR 1.22   Cardiac Enzymes: No results for input(s): CKTOTAL, CKMB, CKMBINDEX, TROPONINI in the last 168 hours. BNP (last 3 results) No results for input(s): PROBNP in the last 8760 hours. HbA1C: No results for input(s): HGBA1C in the last 72 hours. CBG: No results for input(s): GLUCAP in the last 168 hours. Lipid Profile: No results for input(s): CHOL, HDL, LDLCALC, TRIG, CHOLHDL, LDLDIRECT in the last 72 hours. Thyroid Function Tests: No results for input(s): TSH, T4TOTAL, FREET4, T3FREE, THYROIDAB in the last 72 hours. Anemia Panel: No results for input(s): VITAMINB12, FOLATE, FERRITIN, TIBC, IRON, RETICCTPCT in the last 72 hours. Urine analysis:    Component Value Date/Time   COLORURINE YELLOW 07/19/2010 1752   APPEARANCEUR CLEAR 07/19/2010 1752   LABSPEC 1.013 07/19/2010 1752   PHURINE 6.5 07/19/2010 1752   GLUCOSEU NEGATIVE 07/19/2010 1752   HGBUR TRACE* 07/19/2010 1752   HGBUR negative 02/28/2008 0000   BILIRUBINUR NEGATIVE 07/19/2010 1752   KETONESUR NEGATIVE 07/19/2010 1752   PROTEINUR 30* 07/19/2010 1752   UROBILINOGEN 1.0 07/19/2010 1752   NITRITE NEGATIVE 07/19/2010 1752   LEUKOCYTESUR NEGATIVE 07/19/2010 1752   Sepsis Labs: No results found for this or any previous visit (from the past 240 hour(s)).   Radiological Exams on Admission: Dg Chest 2 View  10/01/2015  CLINICAL DATA:  Hypertension and left arm numbness x 1 day EXAM: CHEST  2 VIEW COMPARISON:  04/15/2014. FINDINGS: Lateral view degraded by patient arm position. Pacer/AICD device. Midline trachea. Mild cardiomegaly. Atherosclerosis in the transverse aorta. No  pleural effusion or pneumothorax. Pulmonary interstitial prominence is lower lobe predominant. No lobar consolidation. IMPRESSION: Cardiomegaly with suspicion of mild  pulmonary venous congestion. Electronically Signed   By: [name] M.D.   On: 10/01/2015 22:52   Ct Head Wo Contrast  10/02/2015  CLINICAL DATA:  Acute onset of left-sided facial and extremity weakness and numbness. Initial encounter. EXAM: CT HEAD WITHOUT CONTRAST TECHNIQUE: Contiguous axial images were obtained from the base of the skull through the vertex without intravenous contrast. COMPARISON:  None. FINDINGS: There is no evidence of acute infarction, mass lesion, or intra- or extra-axial hemorrhage on CT. Scattered periventricular and subcortical white matter change likely reflects small vessel ischemic microangiopathy. The posterior fossa, including the cerebellum, brainstem and fourth ventricle, is within normal limits. The third and lateral ventricles, and basal ganglia are unremarkable in appearance. The cerebral hemispheres are symmetric in appearance, with normal gray-white differentiation. No mass effect or midline shift is seen. There is no evidence of fracture; visualized osseous structures are unremarkable in appearance. The orbits are within normal limits. The paranasal sinuses and mastoid air cells are well-aerated. No significant soft tissue abnormalities are seen. IMPRESSION: 1. No acute intracranial pathology seen on CT. 2. Scattered small vessel ischemic microangiopathy. Electronically Signed   By: Garald Balding M.D.   On: 10/02/2015 01:33    EKG: Independently reviewed. Atrial flutter  Assessment/Plan Left arm heaviness: Acute. Patient states symptoms started last night around 8 PM. Patient presents with significantly elevated blood pressure as high as 230/101. CT scan shows no acute abnormalities but scattered small vessel ischemic microangiopathy likely related to patient's uncontrolled blood pressures versus side  effect of recent marijuana use - Admit to a telemetry bed  - Neuro checks  - Check troponin - IV fluids normal saline - Check MRI if compatible with patient's pacemaker - Consider need for neurology consult in a.m.   Chronic Systolic CHF/ Nonischemic Cardiomyopathy: Appears euvolemic - Continue carvedilol, hydralazine/isosorbide dinitrate, spironolactone, Entresto.   Substance abuse: Acute. Patient found to be positive from marijuana on admission. - Will need to counsel need of abstinence of drug abuse  Chronic kidney disease stage III: Patient's creatinine 1.32 on admission. - Gentle IV fluids - Continue to monitor  Essential hypertension:Uncontrolled with BP elevated as high 230/101 on admission. Question compliance or result of recent drug use/exposure. - Continue blood pressure medications as seen above - Labetalol prn SBP>180   Atrial Flutter s/p AICD/PM: Chronic CHADS2-VASc=4.  - Continue Eliquis  Hypothyroidism - Check TSH - Continue levothyroxine    Hyperlipidemia - continue Lipitor   DVT prophylaxis: On Eliquis Code Status: Full Family Communication: none Disposition Plan: Likely discharge home in 1-2 days Consults called: None Admission status: telemetry observation  Norval Morton MD [facility] Pager [phone]  If 7PM-7AM, please contact night-coverage [email or web address] Password [email or web address]  10/02/2015, 3:52 AM

## 2015-10-02 NOTE — Progress Notes (Signed)
Patient seen and evaluated earlier this AM by my associate. Please refer to H and P for details regarding assessment and plan.   Patient denies any HA or focal neurological deficits.  Will continue current regimen and reassess next am  Morgan Clay   Gen: pt in nad, alert and awake CV: s1 and s2 WNL, no rubs Pulm: no increased wob, no wheezes

## 2015-10-03 DIAGNOSIS — R29898 Other symptoms and signs involving the musculoskeletal system: Secondary | ICD-10-CM | POA: Diagnosis not present

## 2015-10-03 NOTE — Discharge Summary (Signed)
Physician Discharge Summary  Morgan Clay [name] DOB: March 09, 1946 DOA: 10/01/2015  PCP: [name], DO  Admit date: 10/01/2015 Discharge date: 10/03/2015  Time spent: > 35 minutes  Recommendations for Outpatient Follow-up:  1. Monitor BP and Adjust antihypertensive medications accordingly   Discharge Diagnoses:  Principal Problem:   Arm heaviness Active Problems:   Hypothyroidism   Chronic systolic heart failure (HCC)   Biventricular ICD (implantable cardioverter-defibrillator) in place   Chronic kidney disease - followed by [facility]   Atypical atrial flutter [facility])   Discharge Condition: Stable  Diet recommendation: Heart healthy  There were no vitals filed for this visit.  History of present illness:  70 y/o with history of HTN, HLD, Hypothyroidism, chronic atrial flutter on chronic anticoagulation, non ischemic cardiomyopathy, CHF with EF 50-55%, status post AICD. UDS was positive for marijuana  Hospital Course:  Left arm heaviness/strokelike symptoms - There is no acute intracranial pathology seen on CT - Suspect may have been secondary to marijuana use or elevated hypertension. Left arm heaviness has completely resolved. - Encourage patient to be consistent with her blood pressure medications.  Her known medical conditions listed above will continue home medication regimen listed below  Essential HTN - continue carvedilol, Bidil, and entresto  Procedures:  None  Consultations:  None  Discharge Exam: Filed Vitals:   10/03/15 0818 10/03/15 0944  BP: 159/70 152/77  Pulse: 50 61  Temp:  97.9 F (36.6 C)  Resp: 15 20    General: Pt in nad, alert and awake Cardiovascular: rrr, no rubs Respiratory: no increased wob, no wheezes  Discharge Instructions   Discharge Instructions    Call MD for:  severe uncontrolled pain    Complete by:  As directed      Call MD for:  temperature >100.4    Complete by:  As directed      Diet - low  sodium heart healthy    Complete by:  As directed      Increase activity slowly    Complete by:  As directed           Current Discharge Medication List    CONTINUE these medications which have NOT CHANGED   Details  albuterol (PROVENTIL HFA;VENTOLIN HFA) 108 (90 BASE) MCG/ACT inhaler Inhale 2 puffs into the lungs every 6 (six) hours as needed for wheezing or shortness of breath. Qty: 1 Inhaler, Refills: 3    carvedilol (COREG) 25 MG tablet Take 1 tablet (25 mg total) by mouth 2 (two) times daily with a meal. Qty: 180 tablet, Refills: 3   Associated Diagnoses: Nonischemic cardiomyopathy ([place]); Automatic implantable cardiac defibrillator in situ; Chronic systolic heart failure (HCC)    furosemide (LASIX) 40 MG tablet Take 1 tablet (40 mg total) by mouth 2 (two) times daily. Qty: 60 tablet, Refills: 11   Associated Diagnoses: Acute on chronic systolic CHF (congestive heart failure) ([place]); Essential hypertension    isosorbide-hydrALAZINE (BIDIL) 20-37.5 MG per tablet Take 1 tablet by mouth 2 (two) times daily. Qty: 60 tablet, Refills: 11    levothyroxine (SYNTHROID, LEVOTHROID) 175 MCG tablet TAKE ONE TABLET BY MOUTH ONCE DAILY BEFORE BREAKFAST Qty: 30 tablet, Refills: 5    nitroGLYCERIN (NITROSTAT) 0.4 MG SL tablet Place 0.4 mg under the tongue every 5 (five) minutes as needed for chest pain.    sacubitril-valsartan (ENTRESTO) 97-103 MG Take 1 tablet by mouth 2 (two) times daily. Qty: 60 tablet, Refills: 11    sertraline (ZOLOFT) 100 MG tablet Take 1  tablet (100 mg total) by mouth daily. Qty: 90 tablet, Refills: 1    traMADol (ULTRAM) 50 MG tablet 1/2 to 1 tablet as needed for severe pain not more then twice daily. Qty: 20 tablet, Refills: 0    traZODone (DESYREL) 50 MG tablet Take 1 tablet (50 mg total) by mouth at bedtime. Qty: 90 tablet, Refills: 3    apixaban (ELIQUIS) 5 MG TABS tablet Take 1 tablet (5 mg total) by mouth 2 (two) times daily. Qty: 60 tablet, Refills: 11    Associated Diagnoses: Biventricular ICD (implantable cardioverter-defibrillator) in place       No Known Allergies    The results of significant diagnostics from this hospitalization (including imaging, microbiology, ancillary and laboratory) are listed below for reference.    Significant Diagnostic Studies: Dg Chest 2 View  10/01/2015  CLINICAL DATA:  Hypertension and left arm numbness x 1 day EXAM: CHEST  2 VIEW COMPARISON:  04/15/2014. FINDINGS: Lateral view degraded by patient arm position. Pacer/AICD device. Midline trachea. Mild cardiomegaly. Atherosclerosis in the transverse aorta. No pleural effusion or pneumothorax. Pulmonary interstitial prominence is lower lobe predominant. No lobar consolidation. IMPRESSION: Cardiomegaly with suspicion of mild pulmonary venous congestion. Electronically Signed   By: [name] M.D.   On: 10/01/2015 22:52   Ct Head Wo Contrast  10/02/2015  CLINICAL DATA:  Acute onset of left-sided facial and extremity weakness and numbness. Initial encounter. EXAM: CT HEAD WITHOUT CONTRAST TECHNIQUE: Contiguous axial images were obtained from the base of the skull through the vertex without intravenous contrast. COMPARISON:  None. FINDINGS: There is no evidence of acute infarction, mass lesion, or intra- or extra-axial hemorrhage on CT. Scattered periventricular and subcortical white matter change likely reflects small vessel ischemic microangiopathy. The posterior fossa, including the cerebellum, brainstem and fourth ventricle, is within normal limits. The third and lateral ventricles, and basal ganglia are unremarkable in appearance. The cerebral hemispheres are symmetric in appearance, with normal gray-white differentiation. No mass effect or midline shift is seen. There is no evidence of fracture; visualized osseous structures are unremarkable in appearance. The orbits are within normal limits. The paranasal sinuses and mastoid air cells are well-aerated. No  significant soft tissue abnormalities are seen. IMPRESSION: 1. No acute intracranial pathology seen on CT. 2. Scattered small vessel ischemic microangiopathy. Electronically Signed   By: Garald Balding M.D.   On: 10/02/2015 01:33    Microbiology: No results found for this or any previous visit (from the past 240 hour(s)).   Labs: Basic Metabolic Panel:  Recent Labs Lab 10/01/15 2212 10/01/15 2221 10/02/15 0448  NA 136 141  --   K 3.6 3.7  --   CL 104 103  --   CO2 23  --   --   GLUCOSE 125* 122*  --   BUN 13 16  --   CREATININE 1.32* 1.30*  --   CALCIUM 9.4  --   --   MG  --   --  1.9   Liver Function Tests:  Recent Labs Lab 10/01/15 2212  AST 23  ALT 17  ALKPHOS 76  BILITOT 0.5  PROT 8.1  ALBUMIN 3.9   No results for input(s): LIPASE, AMYLASE in the last 168 hours. No results for input(s): AMMONIA in the last 168 hours. CBC:  Recent Labs Lab 10/01/15 2212 10/01/15 2221  WBC 4.7  --   NEUTROABS 2.3  --   HGB 14.1 16.3*  HCT 42.9 48.0*  MCV 85.0  --  PLT 167  --    Cardiac Enzymes:  Recent Labs Lab 10/02/15 0448 10/02/15 0746 10/02/15 1132 10/02/15 1233  TROPONINI 0.06* 0.05* 0.06* 0.05*   BNP: BNP (last 3 results) No results for input(s): BNP in the last 8760 hours.  ProBNP (last 3 results) No results for input(s): PROBNP in the last 8760 hours.  CBG: No results for input(s): GLUCAP in the last 168 hours.   Signed:  Velvet Bathe MD.  [facility] 10/03/2015, 1:16 PM

## 2015-10-03 NOTE — Progress Notes (Signed)
Pt discharged home with family, by car, assessment stable, discharge instructions reviewed, all questions answered.2  IVs removed. Telemetry discontinued. Pt taken by wheelchair to exit. Time of discharge: 1639

## 2015-10-03 NOTE — Progress Notes (Signed)
Pt assisted to bathroom. Stand by assist.

## 2015-10-04 NOTE — Progress Notes (Signed)
Cardiology Office Note Date:  10/05/2015  Patient ID:  Morgan Clay, Morgan Clay 1946/02/01, MRN [id number] PCP:  [name]., DO  Cardiologist/Electrophysiologist:  Dr. Caryl Comes   Chief Complaint:  ICD battery at   History of Present Illness: Morgan Clay is a 70 y.o. female with history of HTN, HLD, hypothyroidism, chronic AFlutter, NICM with LBBB and CRT-D in place.  Discharged from [facility]  On 10/03/15 was there with c/  to LUE weakness that completely resolved, negative CT, suspect to be secondary to + UDS for marijuana and HTN with reported BP 230/101.  She was observed overnight and discharge home.  The patient reports that she had 3 days of once-daily nose bleed that she was able to stop by applying tissue to the nare for a few minutes, she had been with increased sinus congestion and was also using an OTC nasal spray.  She was advised to hold her Eliquis for 3 days which she did.  She resumed the Eliquis she states 5/26 took both doses that day and the Am dose 5/27 prior to the onset of her L arm heaviness.  She reports that since there brief stay d/c 5/28 she has not taken the Eliquis, was not sure if she was supposed to but is listed on her med list to resume.  The patient today is being seen for a previously scheduled visit for Dr. Caryl Comes, last seen by him August 2016 and noting on remote her generator at [facility].  She denies any CP, occasionally feels palpitations, no near syncope or syncope.  She has some mild waxing/waning DOE, denies symptoms of PND or orthopnea.  No shocks from her device.   Past Medical History  Diagnosis Date  . LBBB (left bundle branch block)   . Nonischemic cardiomyopathy ([place])     Followed by [facility]  . Biventricular implantable cardiac defibrillator in situ 12/24/2006    Protecta- Medtronic  ICD gen change 2012  . Chronic systolic congestive heart failure ([place])   . Hypothyroidism   . Personal history of other infectious and parasitic disease    Hepatitis B  . FIBROIDS, UTERUS 03/05/2008    Qualifier: Diagnosis of  By: Isla Pence    . OBSTRUCTIVE SLEEP APNEA 11/14/2007    npsg 2009:  Mild osa with AHI 11/hr. Started cpap 2009, quit using due to lack of response Home sleep testing 09/2010 with AHI only 7/hr and weight loss advised by pulmonology.  . OSTEOPENIA 09/30/2008    Qualifier: Diagnosis of  By: Hassell Done FNP, Tori Milks    . OBESITY 05/27/2009    Qualifier: Diagnosis of  By: Hassell Done FNP, Tori Milks    . DEPRESSION 12/17/2009    Annotation: PHQ-9 score = 14 done on 12/17/2009 Qualifier: Diagnosis of  By: Hassell Done FNP, Tori Milks    . Hyperlipemia 12/06/2012  . HYPERTENSION, BENIGN 04/24/2007    Qualifier: Diagnosis of  By: Hassell Done FNP, Tori Milks    . Arthritis   . Glaucoma   . HEPATITIS C - s/p treatment with Harvoni, seeing hepatology, Dawn Drazek  01/04/2007    Qualifier: Diagnosis of  By: Hassell Done FNP, Tori Milks    . Hx of cardiovascular stress test     Lexiscan Myoview (12/15): No ischemia, EF 33%, high risk due to depressed LV function   . Hx of echocardiogram     Echo (12/15): Severe LVH, EF 25-30%, diffuse HK, restrictive physiology, mild AI, mild MR, moderate LAE, mild RVE, mild RAE, moderate to severe TR, mild increased PASP  .  Asthma     reports mild asthma since childhood - had COPD on dx list from prior PCP  . AICD (automatic cardioverter/defibrillator) present   . Atypical atrial flutter ([place]) 09/28/2015    Past Surgical History  Procedure Laterality Date  . Appendectomy    . Cardiac catheterization    . [id number]      Concerto 0000000  . Insert / replace / remove pacemaker      concerto  . Cardioversion N/A 12/14/2014    Procedure: CARDIOVERSION;  Surgeon: Dorothy Spark, MD;  Location: [facility] END[facility]COPY;  Service: Cardiovascular;  Laterality: N/A;    Current Outpatient Prescriptions  Medication Sig Dispense Refill  . albuterol (PROVENTIL HFA;VENTOLIN HFA) 108 (90 BASE) MCG/ACT inhaler Inhale 2 puffs into the lungs every 6  (six) hours as needed for wheezing or shortness of breath. 1 Inhaler 3  . apixaban (ELIQUIS) 5 MG TABS tablet Take 1 tablet (5 mg total) by mouth 2 (two) times daily. 60 tablet 11  . carvedilol (COREG) 25 MG tablet Take 1 tablet (25 mg total) by mouth 2 (two) times daily with a meal. 180 tablet 3  . furosemide (LASIX) 40 MG tablet Take 1 tablet (40 mg total) by mouth 2 (two) times daily. 60 tablet 11  . isosorbide-hydrALAZINE (BIDIL) 20-37.5 MG per tablet Take 1 tablet by mouth 2 (two) times daily. 60 tablet 11  . levothyroxine (SYNTHROID, LEVOTHROID) 175 MCG tablet TAKE ONE TABLET BY MOUTH ONCE DAILY BEFORE BREAKFAST 30 tablet 5  . nitroGLYCERIN (NITROSTAT) 0.4 MG SL tablet Place 0.4 mg under the tongue every 5 (five) minutes as needed for chest pain.    . sacubitril-valsartan (ENTRESTO) 97-103 MG Take 1 tablet by mouth 2 (two) times daily. 60 tablet 11  . sertraline (ZOLOFT) 100 MG tablet Take 1 tablet (100 mg total) by mouth daily. 90 tablet 1  . traMADol (ULTRAM) 50 MG tablet 1/2 to 1 tablet as needed for severe pain not more then twice daily. (Patient taking differently: Take 50 mg by mouth every 6 (six) hours as needed for moderate pain. ) 20 tablet 0  . traZODone (DESYREL) 50 MG tablet Take 1 tablet (50 mg total) by mouth at bedtime. 90 tablet 3   No current facility-administered medications for this visit.    Allergies:   Review of patient's allergies indicates no known allergies.   Social History:  The patient  reports that she has never smoked. She has never used smokeless tobacco. She reports that she drinks alcohol.   Family History:  The patient's family history includes Asthma in her father and sister; Cancer in her sister; Heart attack in her father and mother; Stroke in her brother.  ROS:  Please see the history of present illness.  All other systems are reviewed and otherwise negative.   PHYSICAL EXAM:  VS:  BP 136/74 mmHg  Pulse 72  Ht 4\' 11"  (1.499 m)  Wt 150 lb (68.04  kg)  BMI 30.28 kg/m2 BMI: Body mass index is 30.28 kg/(m^2). Well nourished, well developed, in no acute distress HEENT: normocephalic, atraumatic Neck: no JVD, carotid bruits or masses Cardiac:  normal S1, S2; RRR; 1/6 SM, no rubs, or gallops Lungs:  clear to auscultation bilaterally, no wheezing, rhonchi or rales Abd: soft, nontender MS: no deformity or atrophy Ext: no edema Skin: warm and dry, no rash Neuro:  No gross deficits appreciated Psych: euthymic mood, full affect  ICD site is stable, no tethering or discomfort   EKG:  10/01/15 Aflutter, today is AFlutter V paced RBBB Device check today: battery reached ERI 09/08/15, leads OK, only BiVe pacing 59%, Optivol with an increasing trend but below her threshold  12/28/14: Echocardiogram Study Conclusions - Left ventricle: The cavity size was normal. Wall thickness was  increased in a pattern of mild LVH. Systolic function was normal.  The estimated ejection fraction was in the range of 50% to 55%. - Aortic valve: There was mild to moderate regurgitation. - Mitral valve: There was mild regurgitation. - Left atrium: The atrium was mildly dilated. - Pulmonary arteries: PA peak pressure: 35 mm Hg (S).  04/23/14: Stress myoview Impression Exercise Capacity: Lexiscan with no exercise. BP Response: Hypertensive blood pressure response. Clinical Symptoms: No significant symptoms noted. ECG Impression: No significant ECG changes with Lexiscan. Comparison with Prior Nuclear Study: No images to compare Overall Impression: "High-risk" study due to depressed LV function. No perfusion defects are seen. Findings suggest nonischemic cardiomyopathy. LV Ejection Fraction: 33%. LV Wall Motion: Global hypokinesis, pacing-induced paradoxical septal motion and moderate to severely reduced overall systolic function.    Recent Labs: 10/01/2015: ALT 17; BUN 16; Creatinine, Ser 1.30*; Hemoglobin 16.3*; Platelets 167; Potassium 3.7; Sodium  141 10/02/2015: Magnesium 1.9; TSH 1.603  10/02/2015: Cholesterol 103; HDL 46; LDL Cholesterol 40; Total CHOL/HDL Ratio 2.2; Triglycerides 86; VLDL 17   Estimated Creatinine Clearance: 35.2 mL/min (by C-G formula based on Cr of 1.3).   Wt Readings from Last 3 Encounters:  10/05/15 150 lb (68.04 kg)  09/28/15 152 lb 11.2 oz (69.264 kg)  08/30/15 153 lb 11.2 oz (69.718 kg)     Other studies reviewed: Additional studies/records reviewed today include: summarized above  DEVICE information: MDT CRT-D, implanted 12/2006 by Dr. Lovena Le, gen change 2012   ASSESSMENT AND PLAN:   1. NICM, CRT-D    Device is at [facility]     Carelink 80mo     Exam is negative for any fluid OL     CHF teaching done     Last EF normalized     On BB/ARB, lasix/aldactone and bidil     C/w ICM, L. Short, RN  2. AFlutter      CHA2DS2Vasc is at least 4, on Eliquis (?TIA would be 7)     10/01/15 Creat 1.30, H/H 16/48     Hx of DCCV 12/2014 We had a lengthy discussion regarding her embolic risk especially in the light of recent LUE symptom after an interruption of her a/c.  Bleeding risk also discussed.  She is agreeable to resume her Eliquis, nose bleed was controllable and in the environment of using nasal spray with some increased sinus symptoms.  Monitor closely  3. HTN     Looks good today     Counseled on medicine compliance, she assures me she takes her medicines as Rx     Recommended investing in a home BP cuff  4. + UDS for marijuana      counseled       Disposition: The patient will be scheduled for gen change, this will be her 3rd device, will schedule with antimicrobial pouch,  she will resume her Eliquis and monitor closely and reach out if any bleeding.  She is on max dose of Coreg only BiVe pacing 59% of the time, I will discuss with Dr. Caryl Comes rate control options to allow more BiVe pacing.  Current medicines are reviewed at length with the patient today.    SignedJennings Books, PA-C 10/05/2015 1:10  PM  [place] [address] [phone] (office)  [phone] (fax)

## 2015-10-05 ENCOUNTER — Encounter: Payer: Commercial Managed Care - HMO | Admitting: *Deleted

## 2015-10-05 ENCOUNTER — Encounter: Payer: Self-pay | Admitting: Physician Assistant

## 2015-10-05 ENCOUNTER — Other Ambulatory Visit: Payer: Self-pay | Admitting: Physician Assistant

## 2015-10-05 ENCOUNTER — Telehealth: Payer: Self-pay | Admitting: General Practice

## 2015-10-05 ENCOUNTER — Encounter: Payer: Commercial Managed Care - HMO | Admitting: Physician Assistant

## 2015-10-05 ENCOUNTER — Encounter: Payer: Self-pay | Admitting: *Deleted

## 2015-10-05 ENCOUNTER — Ambulatory Visit (INDEPENDENT_AMBULATORY_CARE_PROVIDER_SITE_OTHER): Payer: Commercial Managed Care - HMO | Admitting: Physician Assistant

## 2015-10-05 VITALS — BP 136/74 | HR 72 | Ht 59.0 in | Wt 150.0 lb

## 2015-10-05 DIAGNOSIS — I429 Cardiomyopathy, unspecified: Secondary | ICD-10-CM

## 2015-10-05 DIAGNOSIS — I10 Essential (primary) hypertension: Secondary | ICD-10-CM | POA: Diagnosis not present

## 2015-10-05 DIAGNOSIS — I484 Atypical atrial flutter: Secondary | ICD-10-CM | POA: Diagnosis not present

## 2015-10-05 LAB — HEMOGLOBIN A1C
HEMOGLOBIN A1C: 5.8 % — AB (ref 4.8–5.6)
MEAN PLASMA GLUCOSE: 120 mg/dL

## 2015-10-05 NOTE — Telephone Encounter (Signed)
1st attempt at [facility] call.  Left message for patient to contact office to make hospital follow up appointment with Dr. [name].

## 2015-10-05 NOTE — Patient Instructions (Addendum)
Medication Instructions:   Your physician recommends that you continue on your current medications as directed. Please refer to the Current Medication list given to you today.   If you need a refill on your cardiac medications before your next appointment, please call your pharmacy.  Labwork:  NONE ORDER TODAY    Testing/Procedures:  SEE LETTER FOR ICD GEN CHANGE   Follow-Up:  [place] AFTER  10/06/15.[name] WITH DEVICE CLINIC  [place]  AFTER 10/06/15 WITH DR Caryl Comes    Any Other Special Instructions Will Be Listed Below (If Applicable).

## 2015-10-06 ENCOUNTER — Emergency Department (HOSPITAL_COMMUNITY)
Admission: EM | Admit: 2015-10-06 | Discharge: 2015-10-06 | Disposition: A | Discharge status: Home / Self Care | Payer: Commercial Managed Care - HMO | Source: Home / Self Care | Attending: Emergency Medicine | Admitting: Emergency Medicine

## 2015-10-06 ENCOUNTER — Encounter (HOSPITAL_COMMUNITY)
Admission: RE | Disposition: A | Discharge status: Home / Self Care | Payer: Self-pay | Source: Ambulatory Visit | Attending: Internal Medicine

## 2015-10-06 ENCOUNTER — Encounter (HOSPITAL_COMMUNITY): Payer: Self-pay | Admitting: *Deleted

## 2015-10-06 ENCOUNTER — Ambulatory Visit (HOSPITAL_COMMUNITY)
Admission: RE | Admit: 2015-10-06 | Discharge: 2015-10-06 | Disposition: A | Discharge status: Home / Self Care | Payer: Commercial Managed Care - HMO | Source: Ambulatory Visit | Attending: Internal Medicine | Admitting: Internal Medicine

## 2015-10-06 DIAGNOSIS — I429 Cardiomyopathy, unspecified: Secondary | ICD-10-CM | POA: Insufficient documentation

## 2015-10-06 DIAGNOSIS — Z8249 Family history of ischemic heart disease and other diseases of the circulatory system: Secondary | ICD-10-CM | POA: Insufficient documentation

## 2015-10-06 DIAGNOSIS — E039 Hypothyroidism, unspecified: Secondary | ICD-10-CM | POA: Insufficient documentation

## 2015-10-06 DIAGNOSIS — M8580 Other specified disorders of bone density and structure, unspecified site: Secondary | ICD-10-CM | POA: Diagnosis not present

## 2015-10-06 DIAGNOSIS — J45909 Unspecified asthma, uncomplicated: Secondary | ICD-10-CM | POA: Insufficient documentation

## 2015-10-06 DIAGNOSIS — I484 Atypical atrial flutter: Secondary | ICD-10-CM | POA: Diagnosis present

## 2015-10-06 DIAGNOSIS — Z950 Presence of cardiac pacemaker: Secondary | ICD-10-CM | POA: Insufficient documentation

## 2015-10-06 DIAGNOSIS — F329 Major depressive disorder, single episode, unspecified: Secondary | ICD-10-CM | POA: Insufficient documentation

## 2015-10-06 DIAGNOSIS — Z8669 Personal history of other diseases of the nervous system and sense organs: Secondary | ICD-10-CM | POA: Insufficient documentation

## 2015-10-06 DIAGNOSIS — E669 Obesity, unspecified: Secondary | ICD-10-CM

## 2015-10-06 DIAGNOSIS — Z6830 Body mass index (BMI) 30.0-30.9, adult: Secondary | ICD-10-CM | POA: Insufficient documentation

## 2015-10-06 DIAGNOSIS — Z95810 Presence of automatic (implantable) cardiac defibrillator: Secondary | ICD-10-CM

## 2015-10-06 DIAGNOSIS — Z7901 Long term (current) use of anticoagulants: Secondary | ICD-10-CM | POA: Diagnosis not present

## 2015-10-06 DIAGNOSIS — I110 Hypertensive heart disease with heart failure: Secondary | ICD-10-CM | POA: Diagnosis not present

## 2015-10-06 DIAGNOSIS — Z4502 Encounter for adjustment and management of automatic implantable cardiac defibrillator: Secondary | ICD-10-CM

## 2015-10-06 DIAGNOSIS — R040 Epistaxis: Secondary | ICD-10-CM | POA: Diagnosis not present

## 2015-10-06 DIAGNOSIS — B1920 Unspecified viral hepatitis C without hepatic coma: Secondary | ICD-10-CM | POA: Insufficient documentation

## 2015-10-06 DIAGNOSIS — G4733 Obstructive sleep apnea (adult) (pediatric): Secondary | ICD-10-CM | POA: Diagnosis not present

## 2015-10-06 DIAGNOSIS — E785 Hyperlipidemia, unspecified: Secondary | ICD-10-CM | POA: Insufficient documentation

## 2015-10-06 DIAGNOSIS — I428 Other cardiomyopathies: Secondary | ICD-10-CM

## 2015-10-06 DIAGNOSIS — Z79899 Other long term (current) drug therapy: Secondary | ICD-10-CM | POA: Insufficient documentation

## 2015-10-06 DIAGNOSIS — M1990 Unspecified osteoarthritis, unspecified site: Secondary | ICD-10-CM | POA: Insufficient documentation

## 2015-10-06 DIAGNOSIS — I5022 Chronic systolic (congestive) heart failure: Secondary | ICD-10-CM | POA: Insufficient documentation

## 2015-10-06 DIAGNOSIS — Z8619 Personal history of other infectious and parasitic diseases: Secondary | ICD-10-CM | POA: Diagnosis not present

## 2015-10-06 DIAGNOSIS — I10 Essential (primary) hypertension: Secondary | ICD-10-CM | POA: Insufficient documentation

## 2015-10-06 DIAGNOSIS — I4892 Unspecified atrial flutter: Secondary | ICD-10-CM | POA: Insufficient documentation

## 2015-10-06 DIAGNOSIS — H409 Unspecified glaucoma: Secondary | ICD-10-CM | POA: Diagnosis not present

## 2015-10-06 DIAGNOSIS — Z8739 Personal history of other diseases of the musculoskeletal system and connective tissue: Secondary | ICD-10-CM

## 2015-10-06 DIAGNOSIS — I5032 Chronic diastolic (congestive) heart failure: Secondary | ICD-10-CM | POA: Diagnosis present

## 2015-10-06 DIAGNOSIS — I452 Bifascicular block: Secondary | ICD-10-CM | POA: Diagnosis not present

## 2015-10-06 HISTORY — PX: EP IMPLANTABLE DEVICE: SHX172B

## 2015-10-06 HISTORY — DX: Presence of automatic (implantable) cardiac defibrillator: Z95.810

## 2015-10-06 LAB — SURGICAL PCR SCREEN
MRSA, PCR: NEGATIVE
Staphylococcus aureus: NEGATIVE

## 2015-10-06 SURGERY — ICD/BIV ICD GENERATOR CHANGEOUT

## 2015-10-06 MED ORDER — MUPIROCIN 2 % EX OINT
1.0000 "application " | TOPICAL_OINTMENT | Freq: Once | CUTANEOUS | Status: AC
  Administered 2015-10-06: 1 via TOPICAL
  Filled 2015-10-06: qty 22

## 2015-10-06 MED ORDER — FENTANYL CITRATE (PF) 100 MCG/2ML IJ SOLN
INTRAMUSCULAR | Status: AC
  Filled 2015-10-06: qty 2

## 2015-10-06 MED ORDER — SODIUM CHLORIDE 0.9 % IR SOLN
Status: AC
  Administered 2015-10-06: 17:00:00

## 2015-10-06 MED ORDER — LIDOCAINE HCL (PF) 1 % IJ SOLN
INTRAMUSCULAR | Status: AC
  Filled 2015-10-06: qty 60

## 2015-10-06 MED ORDER — CEFAZOLIN SODIUM-DEXTROSE 2-4 GM/100ML-% IV SOLN
2.0000 g | INTRAVENOUS | Status: AC
  Administered 2015-10-06: 2 g via INTRAVENOUS

## 2015-10-06 MED ORDER — FENTANYL CITRATE (PF) 100 MCG/2ML IJ SOLN
INTRAMUSCULAR | Status: DC | PRN
  Administered 2015-10-06 (×2): 50 ug via INTRAVENOUS

## 2015-10-06 MED ORDER — MIDAZOLAM HCL 5 MG/5ML IJ SOLN
INTRAMUSCULAR | Status: AC
  Filled 2015-10-06: qty 5

## 2015-10-06 MED ORDER — SODIUM CHLORIDE 0.9 % IR SOLN
80.0000 mg | Status: AC
  Administered 2015-10-06: 80 mg

## 2015-10-06 MED ORDER — SODIUM CHLORIDE 0.9 % IV SOLN
INTRAVENOUS | Status: DC
  Administered 2015-10-06: 15:00:00 via INTRAVENOUS

## 2015-10-06 MED ORDER — LIDOCAINE HCL (PF) 1 % IJ SOLN
INTRAMUSCULAR | Status: DC | PRN
  Administered 2015-10-06: 40 mL

## 2015-10-06 MED ORDER — ONDANSETRON HCL 4 MG/2ML IJ SOLN: 4.0000 mg | Freq: Four times a day (QID) | INTRAMUSCULAR | Status: DC | PRN

## 2015-10-06 MED ORDER — ACETAMINOPHEN 325 MG PO TABS
325.0000 mg | ORAL_TABLET | ORAL | Status: DC | PRN
  Filled 2015-10-06: qty 2

## 2015-10-06 MED ORDER — GENTAMICIN SULFATE 40 MG/ML IJ SOLN
INTRAMUSCULAR | Status: AC
  Filled 2015-10-06: qty 2

## 2015-10-06 MED ORDER — MUPIROCIN 2 % EX OINT
TOPICAL_OINTMENT | CUTANEOUS | Status: AC
  Administered 2015-10-06: 1 via TOPICAL
  Filled 2015-10-06: qty 22

## 2015-10-06 MED ORDER — SODIUM CHLORIDE 0.9 % IR SOLN
Status: AC
  Filled 2015-10-06: qty 2

## 2015-10-06 MED ORDER — MIDAZOLAM HCL 5 MG/5ML IJ SOLN
INTRAMUSCULAR | Status: DC | PRN
  Administered 2015-10-06: 1 mg via INTRAVENOUS
  Administered 2015-10-06 (×2): 2 mg via INTRAVENOUS

## 2015-10-06 MED ORDER — CEFAZOLIN SODIUM-DEXTROSE 2-4 GM/100ML-% IV SOLN
INTRAVENOUS | Status: AC
  Filled 2015-10-06: qty 100

## 2015-10-06 MED ORDER — SODIUM CHLORIDE 0.9 % IV SOLN: INTRAVENOUS | Status: DC

## 2015-10-06 MED ORDER — CHLORHEXIDINE GLUCONATE 4 % EX LIQD
60.0000 mL | Freq: Once | CUTANEOUS | Status: DC
  Filled 2015-10-06: qty 60

## 2015-10-06 SURGICAL SUPPLY — 6 items
CABLE SURGICAL S-101-97-12 (CABLE) ×2 IMPLANT
HEMOSTAT SURGICEL 2X4 FIBR (HEMOSTASIS) ×2 IMPLANT
ICD VIVA XT CRT-D DTBA1D1 (ICD Generator) ×2 IMPLANT
PAD DEFIB LIFELINK (PAD) ×2 IMPLANT
POUCH AIGIS-R ANTIBACT ICD (Mesh General) ×2 IMPLANT
TRAY PACEMAKER INSERTION (PACKS) ×2 IMPLANT

## 2015-10-06 NOTE — H&P (View-Only) (Signed)
Cardiology Office Note Date:  10/05/2015  Patient ID:  [name], [name] 02-15-1946, MRN [id number] PCP:  [name]., DO  Cardiologist/Electrophysiologist:  Dr. Caryl Comes   Chief Complaint:  ICD battery at   History of Present Illness: Morgan Clay is a 70 y.o. female with history of HTN, HLD, hypothyroidism, chronic AFlutter, NICM with LBBB and CRT-D in place.  Discharged from [facility]  On 10/03/15 was there with c/  to LUE weakness that completely resolved, negative CT, suspect to be secondary to + UDS for marijuana and HTN with reported BP 230/101.  She was observed overnight and discharge home.  The patient reports that she had 3 days of once-daily nose bleed that she was able to stop by applying tissue to the nare for a few minutes, she had been with increased sinus congestion and was also using an OTC nasal spray.  She was advised to hold her Eliquis for 3 days which she did.  She resumed the Eliquis she states 5/26 took both doses that day and the Am dose 5/27 prior to the onset of her L arm heaviness.  She reports that since there brief stay d/c 5/28 she has not taken the Eliquis, was not sure if she was supposed to but is listed on her med list to resume.  The patient today is being seen for a previously scheduled visit for Dr. Caryl Comes, last seen by him August 2016 and noting on remote her generator at [facility].  She denies any CP, occasionally feels palpitations, no near syncope or syncope.  She has some mild waxing/waning DOE, denies symptoms of PND or orthopnea.  No shocks from her device.   Past Medical History  Diagnosis Date  . LBBB (left bundle branch block)   . Nonischemic cardiomyopathy ([place])     Followed by [facility]  . Biventricular implantable cardiac defibrillator in situ 12/24/2006    Protecta- Medtronic  ICD gen change 2012  . Chronic systolic congestive heart failure ([place])   . Hypothyroidism   . Personal history of other infectious and parasitic disease    Hepatitis B  . FIBROIDS, UTERUS 03/05/2008    Qualifier: Diagnosis of  By: Isla Pence    . OBSTRUCTIVE SLEEP APNEA 11/14/2007    npsg 2009:  Mild osa with AHI 11/hr. Started cpap 2009, quit using due to lack of response Home sleep testing 09/2010 with AHI only 7/hr and weight loss advised by pulmonology.  . OSTEOPENIA 09/30/2008    Qualifier: Diagnosis of  By: Hassell Done FNP, Tori Milks    . OBESITY 05/27/2009    Qualifier: Diagnosis of  By: Hassell Done FNP, Tori Milks    . DEPRESSION 12/17/2009    Annotation: PHQ-9 score = 14 done on 12/17/2009 Qualifier: Diagnosis of  By: Hassell Done FNP, Tori Milks    . Hyperlipemia 12/06/2012  . HYPERTENSION, BENIGN 04/24/2007    Qualifier: Diagnosis of  By: Hassell Done FNP, Tori Milks    . Arthritis   . Glaucoma   . HEPATITIS C - s/p treatment with Harvoni, seeing hepatology, Dawn Drazek  01/04/2007    Qualifier: Diagnosis of  By: Hassell Done FNP, Tori Milks    . Hx of cardiovascular stress test     Lexiscan Myoview (12/15): No ischemia, EF 33%, high risk due to depressed LV function   . Hx of echocardiogram     Echo (12/15): Severe LVH, EF 25-30%, diffuse HK, restrictive physiology, mild AI, mild MR, moderate LAE, mild RVE, mild RAE, moderate to severe TR, mild increased PASP  .  Asthma     reports mild asthma since childhood - had COPD on dx list from prior PCP  . AICD (automatic cardioverter/defibrillator) present   . Atypical atrial flutter ([place]) 09/28/2015    Past Surgical History  Procedure Laterality Date  . Appendectomy    . Cardiac catheterization    . [id number]      Concerto 0000000  . Insert / replace / remove pacemaker      concerto  . Cardioversion N/A 12/14/2014    Procedure: CARDIOVERSION;  Surgeon: Dorothy Spark, MD;  Location: [facility] ENDOSCOPY;  Service: Cardiovascular;  Laterality: N/A;    Current Outpatient Prescriptions  Medication Sig Dispense Refill  . albuterol (PROVENTIL HFA;VENTOLIN HFA) 108 (90 BASE) MCG/ACT inhaler Inhale 2 puffs into the lungs every 6  (six) hours as needed for wheezing or shortness of breath. 1 Inhaler 3  . apixaban (ELIQUIS) 5 MG TABS tablet Take 1 tablet (5 mg total) by mouth 2 (two) times daily. 60 tablet 11  . carvedilol (COREG) 25 MG tablet Take 1 tablet (25 mg total) by mouth 2 (two) times daily with a meal. 180 tablet 3  . furosemide (LASIX) 40 MG tablet Take 1 tablet (40 mg total) by mouth 2 (two) times daily. 60 tablet 11  . isosorbide-hydrALAZINE (BIDIL) 20-37.5 MG per tablet Take 1 tablet by mouth 2 (two) times daily. 60 tablet 11  . levothyroxine (SYNTHROID, LEVOTHROID) 175 MCG tablet TAKE ONE TABLET BY MOUTH ONCE DAILY BEFORE BREAKFAST 30 tablet 5  . nitroGLYCERIN (NITROSTAT) 0.4 MG SL tablet Place 0.4 mg under the tongue every 5 (five) minutes as needed for chest pain.    . sacubitril-valsartan (ENTRESTO) 97-103 MG Take 1 tablet by mouth 2 (two) times daily. 60 tablet 11  . sertraline (ZOLOFT) 100 MG tablet Take 1 tablet (100 mg total) by mouth daily. 90 tablet 1  . traMADol (ULTRAM) 50 MG tablet 1/2 to 1 tablet as needed for severe pain not more then twice daily. (Patient taking differently: Take 50 mg by mouth every 6 (six) hours as needed for moderate pain. ) 20 tablet 0  . traZODone (DESYREL) 50 MG tablet Take 1 tablet (50 mg total) by mouth at bedtime. 90 tablet 3   No current facility-administered medications for this visit.    Allergies:   Review of patient's allergies indicates no known allergies.   Social History:  The patient  reports that she has never smoked. She has never used smokeless tobacco. She reports that she drinks alcohol.   Family History:  The patient's family history includes Asthma in her father and sister; Cancer in her sister; Heart attack in her father and mother; Stroke in her brother.  ROS:  Please see the history of present illness.  All other systems are reviewed and otherwise negative.   PHYSICAL EXAM:  VS:  BP 136/74 mmHg  Pulse 72  Ht 4\' 11"  (1.499 m)  Wt 150 lb (68.04  kg)  BMI 30.28 kg/m2 BMI: Body mass index is 30.28 kg/(m^2). Well nourished, well developed, in no acute distress HEENT: normocephalic, atraumatic Neck: no JVD, carotid bruits or masses Cardiac:  normal S1, S2; RRR; 1/6 SM, no rubs, or gallops Lungs:  clear to auscultation bilaterally, no wheezing, rhonchi or rales Abd: soft, nontender MS: no deformity or atrophy Ext: no edema Skin: warm and dry, no rash Neuro:  No gross deficits appreciated Psych: euthymic mood, full affect  ICD site is stable, no tethering or discomfort   EKG:  10/01/15 Aflutter, today is AFlutter V paced RBBB Device check today: battery reached ERI 09/08/15, leads OK, only BiVe pacing 59%, Optivol with an increasing trend but below her threshold  12/28/14: Echocardiogram Study Conclusions - Left ventricle: The cavity size was normal. Wall thickness was  increased in a pattern of mild LVH. Systolic function was normal.  The estimated ejection fraction was in the range of 50% to 55%. - Aortic valve: There was mild to moderate regurgitation. - Mitral valve: There was mild regurgitation. - Left atrium: The atrium was mildly dilated. - Pulmonary arteries: PA peak pressure: 35 mm Hg (S).  04/23/14: Stress myoview Impression Exercise Capacity: Lexiscan with no exercise. BP Response: Hypertensive blood pressure response. Clinical Symptoms: No significant symptoms noted. ECG Impression: No significant ECG changes with Lexiscan. Comparison with Prior Nuclear Study: No images to compare Overall Impression: "High-risk" study due to depressed LV function. No perfusion defects are seen. Findings suggest nonischemic cardiomyopathy. LV Ejection Fraction: 33%. LV Wall Motion: Global hypokinesis, pacing-induced paradoxical septal motion and moderate to severely reduced overall systolic function.    Recent Labs: 10/01/2015: ALT 17; BUN 16; Creatinine, Ser 1.30*; Hemoglobin 16.3*; Platelets 167; Potassium 3.7; Sodium  141 10/02/2015: Magnesium 1.9; TSH 1.603  10/02/2015: Cholesterol 103; HDL 46; LDL Cholesterol 40; Total CHOL/HDL Ratio 2.2; Triglycerides 86; VLDL 17   Estimated Creatinine Clearance: 35.2 mL/min (by C-G formula based on Cr of 1.3).   Wt Readings from Last 3 Encounters:  10/05/15 150 lb (68.04 kg)  09/28/15 152 lb 11.2 oz (69.264 kg)  08/30/15 153 lb 11.2 oz (69.718 kg)     Other studies reviewed: Additional studies/records reviewed today include: summarized above  DEVICE information: MDT CRT-D, implanted 12/2006 by Dr. Lovena Le, gen change 2012   ASSESSMENT AND PLAN:   1. NICM, CRT-D    [facility] at [facility]     Carelink 84mo     Exam is negative for any fluid OL     CHF teaching done     Last EF normalized     On BB/ARB, lasix/aldactone and bidil     C/w ICM, L. Short, RN  2. AFlutter      CHA2DS2Vasc is at least 4, on Eliquis (?TIA would be 7)     10/01/15 Creat 1.30, H/H 16/48     Hx of DCCV 12/2014 We had a lengthy discussion regarding her embolic risk especially in the light of recent LUE symptom after an interruption of her a/c.  Bleeding risk also discussed.  She is agreeable to resume her Eliquis, nose bleed was controllable and in the environment of using nasal spray with some increased sinus symptoms.  Monitor closely  3. HTN     Looks good today     Counseled on medicine compliance, she assures me she takes her medicines as Rx     Recommended investing in a home BP cuff  4. + UDS for marijuana      counseled       Disposition: The patient will be scheduled for gen change, this will be her 3rd device, will schedule with antimicrobial pouch,  she will resume her Eliquis and monitor closely and reach out if any bleeding.  She is on max dose of Coreg only BiVe pacing 59% of the time, I will discuss with Dr. Caryl Comes rate control options to allow more BiVe pacing.  Current medicines are reviewed at length with the patient today.    SignedJennings Books, PA-C 10/05/2015 1:10  PM  [place] [address] [phone] (office)  [phone] (fax)

## 2015-10-06 NOTE — Discharge Instructions (Signed)

## 2015-10-06 NOTE — Discharge Instructions (Signed)
Hypertension Hypertension, commonly called high blood pressure, is when the force of blood pumping through your arteries is too strong. Your arteries are the blood vessels that carry blood from your heart throughout your body. A blood pressure reading consists of a higher number over a lower number, such as 110/72. The higher number (systolic) is the pressure inside your arteries when your heart pumps. The lower number (diastolic) is the pressure inside your arteries when your heart relaxes. Ideally you want your blood pressure below 120/80. Hypertension forces your heart to work harder to pump blood. Your arteries may become narrow or stiff. Having untreated or uncontrolled hypertension can cause heart attack, stroke, kidney disease, and other problems. RISK FACTORS Some risk factors for high blood pressure are controllable. Others are not.  Risk factors you cannot control include:   Race. You may be at higher risk if you are African American.  Age. Risk increases with age.  Gender. Men are at higher risk than women before age 45 years. After age 65, women are at higher risk than men. Risk factors you can control include:  Not getting enough exercise or physical activity.  Being overweight.  Getting too much fat, sugar, calories, or salt in your diet.  Drinking too much alcohol. SIGNS AND SYMPTOMS Hypertension does not usually cause signs or symptoms. Extremely high blood pressure (hypertensive crisis) may cause headache, anxiety, shortness of breath, and nosebleed. DIAGNOSIS To check if you have hypertension, your health care provider will measure your blood pressure while you are seated, with your arm held at the level of your heart. It should be measured at least twice using the same arm. Certain conditions can cause a difference in blood pressure between your right and left arms. A blood pressure reading that is higher than normal on one occasion does not mean that you need treatment. If  it is not clear whether you have high blood pressure, you may be asked to return on a different day to have your blood pressure checked again. Or, you may be asked to monitor your blood pressure at home for 1 or more weeks. TREATMENT Treating high blood pressure includes making lifestyle changes and possibly taking medicine. Living a healthy lifestyle can help lower high blood pressure. You may need to change some of your habits. Lifestyle changes may include:  Following the DASH diet. This diet is high in fruits, vegetables, and whole grains. It is low in salt, red meat, and added sugars.  Keep your sodium intake below 2,300 mg per day.  Getting at least 30-45 minutes of aerobic exercise at least 4 times per week.  Losing weight if necessary.  Not smoking.  Limiting alcoholic beverages.  Learning ways to reduce stress. Your health care provider may prescribe medicine if lifestyle changes are not enough to get your blood pressure under control, and if one of the following is true:  You are 18-59 years of age and your systolic blood pressure is above 140.  You are 60 years of age or older, and your systolic blood pressure is above 150.  Your diastolic blood pressure is above 90.  You have diabetes, and your systolic blood pressure is over 140 or your diastolic blood pressure is over 90.  You have kidney disease and your blood pressure is above 140/90.  You have heart disease and your blood pressure is above 140/90. Your personal target blood pressure may vary depending on your medical conditions, your age, and other factors. HOME CARE INSTRUCTIONS    Have your blood pressure rechecked as directed by your health care provider.   Take medicines only as directed by your health care provider. Follow the directions carefully. Blood pressure medicines must be taken as prescribed. The medicine does not work as well when you skip doses. Skipping doses also puts you at risk for  problems.  Do not smoke.   Monitor your blood pressure at home as directed by your health care provider. SEEK MEDICAL CARE IF:   You think you are having a reaction to medicines taken.  You have recurrent headaches or feel dizzy.  You have swelling in your ankles.  You have trouble with your vision. SEEK IMMEDIATE MEDICAL CARE IF:  You develop a severe headache or confusion.  You have unusual weakness, numbness, or feel faint.  You have severe chest or abdominal pain.  You vomit repeatedly.  You have trouble breathing. MAKE SURE YOU:   Understand these instructions.  Will watch your condition.  Will get help right away if you are not doing well or get worse.   This information is not intended to replace advice given to you by your health care provider. Make sure you discuss any questions you have with your health care provider.   Document Released: 04/24/2005 Document Revised: 09/08/2014 Document Reviewed: 02/14/2013 Elsevier Interactive Patient Education 2016 [employer].  

## 2015-10-06 NOTE — ED Provider Notes (Signed)
CSN: [id number]     Arrival date & time 10/06/15  1842 History   First MD Initiated Contact with Patient 10/06/15 1851     Chief Complaint  Patient presents with  . Hypertension     (Consider location/radiation/quality/duration/timing/severity/associated sxs/prior Treatment) HPI Patient is a 70 year old female with complex past medical history including hypertension, hyperlipidemia, hypothyroidism, chronic A flutter, Nicm with left bundle branch block, status post AICD, CHF (EF 50-55%) who presents with hypertension. Today she had her AICD changed by cardiology (Dr. Caryl Comes). She was being monitored postoperatively with plan to discharge home at 6pm, but was noted to be hypertensive with blood pressure [id number] systolic. She is referred to the ED for further evaluation. Patient denies any symptoms including headache, numbness or weakness, chest pain, shortness of breath, lower extremity swelling. She reports she has taken her blood pressure medicines as prescribed (BiDil, Lasix, Coreg) with last dose taken this morning before her procedure. Complains of mild pain around her incision site.  Past Medical History  Diagnosis Date  . LBBB (left bundle branch block)   . Nonischemic cardiomyopathy ([place])     Followed by [facility]  . Biventricular implantable cardiac defibrillator in situ 12/24/2006    Protecta- [employer]  ICD gen change 2012  . Chronic systolic congestive heart failure ([place])   . Hypothyroidism   . Personal history of other infectious and parasitic disease     Hepatitis B  . FIBROIDS, UTERUS 03/05/2008    Qualifier: Diagnosis of  By: Isla Pence    . OBSTRUCTIVE SLEEP APNEA 11/14/2007    npsg 2009:  Mild osa with AHI 11/hr. Started cpap 2009, quit using due to lack of response Home sleep testing 09/2010 with AHI only 7/hr and weight loss advised by pulmonology.  . OSTEOPENIA 09/30/2008    Qualifier: Diagnosis of  By: Hassell Done FNP, Tori Milks    . OBESITY 05/27/2009   Qualifier: Diagnosis of  By: Hassell Done FNP, Tori Milks    . DEPRESSION 12/17/2009    Annotation: PHQ-9 score = 14 done on 12/17/2009 Qualifier: Diagnosis of  By: Hassell Done FNP, Tori Milks    . Hyperlipemia 12/06/2012  . HYPERTENSION, BENIGN 04/24/2007    Qualifier: Diagnosis of  By: Hassell Done FNP, Tori Milks    . Arthritis   . Glaucoma   . HEPATITIS C - s/p treatment with Harvoni, seeing hepatology, Dawn Drazek  01/04/2007    Qualifier: Diagnosis of  By: Hassell Done FNP, Tori Milks    . Hx of cardiovascular stress test     Lexiscan Myoview (12/15): No ischemia, EF 33%, high risk due to depressed LV function   . Hx of echocardiogram     Echo (12/15): Severe LVH, EF 25-30%, diffuse HK, restrictive physiology, mild AI, mild MR, moderate LAE, mild RVE, mild RAE, moderate to severe TR, mild increased PASP  . Asthma     reports mild asthma since childhood - had COPD on dx list from prior PCP  . AICD (automatic cardioverter/defibrillator) present   . Atypical atrial flutter ([place]) 09/28/2015   Past Surgical History  Procedure Laterality Date  . Appendectomy    . Cardiac catheterization    . [id number]      Concerto 0000000  . Insert / replace / remove pacemaker      concerto  . Cardioversion N/A 12/14/2014    Procedure: CARDIOVERSION;  Surgeon: Dorothy Spark, MD;  Location: [facility] ENDOSCOPY;  Service: Cardiovascular;  Laterality: N/A;   Family History  Problem Relation Age of Onset  . Asthma  Father   . Heart attack Father   . Asthma Sister   . Cancer Sister     lung  . Diabetes    . Heart attack Mother   . Stroke Brother    Social History  Substance Use Topics  . Smoking status: Never Smoker   . Smokeless tobacco: Never Used  . Alcohol Use: Yes     Comment: 3 glasses of wine 3-4 times per week   OB History    No data available     Review of Systems  Constitutional: Positive for fatigue. Negative for fever and chills.  HENT: Negative for congestion and rhinorrhea.   Respiratory: Negative for cough and  shortness of breath.   Cardiovascular: Negative for chest pain, palpitations and leg swelling.  Gastrointestinal: Negative for nausea, vomiting, abdominal pain and diarrhea.  Genitourinary: Negative for dysuria and difficulty urinating.  Musculoskeletal: Negative for back pain and neck pain.  Skin: Negative for pallor and rash.  Neurological: Negative for dizziness and headaches.  Psychiatric/Behavioral: Negative for confusion.      Allergies  Review of patient's allergies indicates no known allergies.  Home Medications   Prior to Admission medications   Medication Sig Start Date End Date Taking? Authorizing Provider  albuterol (PROVENTIL HFA;VENTOLIN HFA) 108 (90 BASE) MCG/ACT inhaler Inhale 2 puffs into the lungs every 6 (six) hours as needed for wheezing or shortness of breath. 01/01/15   [name], DO  apixaban (ELIQUIS) 5 MG TABS tablet Take 1 tablet (5 mg total) by mouth 2 (two) times daily. 10/19/14   Liliane Shi, PA-C  carvedilol (COREG) 25 MG tablet Take 1 tablet (25 mg total) by mouth 2 (two) times daily with a meal. 05/21/13   Wellington Hampshire, MD  furosemide (LASIX) 40 MG tablet Take 1 tablet (40 mg total) by mouth 2 (two) times daily. 01/05/15   [name], MD  isosorbide-hydrALAZINE (BIDIL) 20-37.5 MG per tablet Take 1 tablet by mouth 2 (two) times daily. 01/05/15   [name], MD  levothyroxine (SYNTHROID, LEVOTHROID) 175 MCG tablet TAKE ONE TABLET BY MOUTH ONCE DAILY BEFORE BREAKFAST 09/22/15   Eulas Post, MD  nitroGLYCERIN (NITROSTAT) 0.4 MG SL tablet Place 0.4 mg under the tongue every 5 (five) minutes as needed for chest pain.    Historical Provider, MD  sacubitril-valsartan (ENTRESTO) 97-103 MG Take 1 tablet by mouth 2 (two) times daily. 01/05/15   [name], MD  sertraline (ZOLOFT) 100 MG tablet Take 1 tablet (100 mg total) by mouth daily. 06/30/14   [name], DO  traMADol (ULTRAM) 50 MG tablet 1/2 to 1 tablet as needed for severe pain not more  then twice daily. Patient taking differently: Take 50 mg by mouth every 6 (six) hours as needed for moderate pain.  08/30/15   [name], DO  traZODone (DESYREL) 50 MG tablet Take 1 tablet (50 mg total) by mouth at bedtime. 06/30/15   [name], DO   BP 167/77 mmHg  Pulse 58  Temp(Src) 97.8 F (36.6 C) (Oral)  Resp 17  SpO2 99% Physical Exam  Constitutional: She is oriented to person, place, and time. She appears well-developed and well-nourished. No distress.  HENT:  Head: Normocephalic and atraumatic.  Eyes: EOM are normal. Pupils are equal, round, and reactive to light.  Neck: Normal range of motion. Neck supple.  Cardiovascular: Normal rate, regular rhythm and intact distal pulses.   Pulmonary/Chest: Effort normal and breath sounds  normal. No respiratory distress.  AICD site on left upper chest wall, incision c/d/i, appropriately TTP post-operatively  Abdominal: Soft. She exhibits no distension. There is no tenderness.  Musculoskeletal: Normal range of motion. She exhibits no edema or tenderness.  Neurological: She is alert and oriented to person, place, and time.  Skin: Skin is warm and dry. No rash noted.  Psychiatric: She has a normal mood and affect.  Nursing note and vitals reviewed.   ED Course  Procedures (including critical care time) Labs Review Labs Reviewed - No data to display  Imaging Review No results found. I have personally reviewed and evaluated these images and lab results as part of my medical decision-making.   EKG Interpretation None      MDM   Final diagnoses:  Essential hypertension    Patient is a 70 year old female with past medical history of hypertension who presents for evaluation of hypertension. Blood pressures are measured as high as [id number] systolic prior to arrival. On my exam patient's initial blood pressure is 167/77. On review of past measures this appears to be near patient's baseline recently. She is borderline bradycardic  consistent with taking beta blockers. Other vital signs within normal limits. Physical exam is unremarkable and patient reports she is asymptomatic at this time. She was monitored in the ED and had appropriate blood pressures on serial measurements. No further interventions are necessary at this time. Patient was discharged in stable condition. She is advised to take her regular nighttime BP medications when she gets home. Strict return cautions for hypertension discussed and the patient reports understanding and agreement with plan.  Patient seen and discussed with Dr. Oleta Mouse, ED attending     Gibson Ramp, MD 10/06/15 [id number]  Forde Dandy, MD 10/07/15 1233

## 2015-10-06 NOTE — Progress Notes (Signed)
Transferred to ER via wheelchair and report given to ER charge nurse and notified her iv nsl in left forearm

## 2015-10-06 NOTE — Interval H&P Note (Signed)
ICD Criteria  Current LVEF:55%. Within 12 months prior to implant: Yes   Heart failure history: Yes, Class II  Cardiomyopathy history: Yes, Ischemic Cardiomyopathy.  Atrial Fibrillation/Atrial Flutter: Yes, Persistent (> 7 Days).  Ventricular tachycardia history: No.  Cardiac arrest history: No.  History of syndromes with risk of sudden death: No.  Previous ICD: Yes, Reason for ICD:  Primary prevention.  Current ICD indication: Primary  PPM indication: No.   Class I or II Bradycardia indication present: No  Beta Blocker therapy for 3 or more months: Yes, prescribed.   Ace Inhibitor/ARB therapy for 3 or more months: Yes, prescribed.   History and Physical Interval Note:  10/06/2015 3:57 PM  Morgan Clay  has presented today for surgery, with the diagnosis of cm  The various methods of treatment have been discussed with the patient and family. After consideration of risks, benefits and other options for treatment, the patient has consented to  Procedure(s): BIV ICD Generator Changeout (N/A) as a surgical intervention .  The patient's history has been reviewed, patient examined, no change in status, stable for surgery.  I have reviewed the patient's chart and labs.  Questions were answered to the patient's satisfaction.     Virl Axe

## 2015-10-06 NOTE — ED Notes (Signed)
Pt was in short stay and found to be hypertensive. Pt had a pacemaker generator change today. Pt was to be discharged at 6pm but due to BP was unable to be dc per dr Jens Som. Pt reports pain at the site.

## 2015-10-06 NOTE — Progress Notes (Signed)
Dr Caryl Comes notified of B/P and per Dr Caryl Comes client taken to ER

## 2015-10-06 NOTE — Interval H&P Note (Signed)
History and Physical Interval Note:  10/06/2015 3:11 PM  Morgan Clay  has presented today for surgery, with the diagnosis of cm  The various methods of treatment have been discussed with the patient and family. After consideration of risks, benefits and other options for treatment, the patient has consented to  Procedure(s): BIV ICD Generator Changeout (N/A) as a surgical intervention .  The patient's history has been reviewed, patient examined, no change in status, stable for surgery.  I have reviewed the patient's chart and labs.  Questions were answered to the patient's satisfaction.     Virl Axe  Hx of recurrent atrial flutter cardioverted 8/16 and held sinus for a few months; she then had recurrent flutter for a few weeks fall 2016, reverting spontaneously to sinus and then again to atrial flutter which has been persistent since then.

## 2015-10-07 ENCOUNTER — Encounter (HOSPITAL_COMMUNITY): Payer: Self-pay | Admitting: Internal Medicine

## 2015-10-07 MED FILL — Gentamicin Sulfate Inj 40 MG/ML: INTRAMUSCULAR | Qty: 2 | Status: AC

## 2015-10-07 MED FILL — Sodium Chloride Irrigation Soln 0.9%: Qty: 500 | Status: AC

## 2015-10-14 ENCOUNTER — Telehealth: Payer: Self-pay

## 2015-10-14 NOTE — Telephone Encounter (Signed)
Attempted call to patient requested to send transmission.

## 2015-10-18 ENCOUNTER — Encounter: Payer: Self-pay | Admitting: Internal Medicine

## 2015-10-18 ENCOUNTER — Ambulatory Visit (INDEPENDENT_AMBULATORY_CARE_PROVIDER_SITE_OTHER): Payer: Commercial Managed Care - HMO | Admitting: *Deleted

## 2015-10-18 DIAGNOSIS — Z95810 Presence of automatic (implantable) cardiac defibrillator: Secondary | ICD-10-CM

## 2015-10-18 LAB — CUP PACEART INCLINIC DEVICE CHECK
Battery Remaining Longevity: 115 mo
Brady Statistic AP VP Percent: 0 %
Brady Statistic AP VS Percent: 0 %
Brady Statistic AS VS Percent: 58.77 %
Date Time Interrogation Session: 20170612180440
HIGH POWER IMPEDANCE MEASURED VALUE: 68 Ohm
HighPow Impedance: 39 Ohm
Implantable Lead Implant Date: 20080818
Implantable Lead Location: 753858
Implantable Lead Location: 753859
Implantable Lead Location: 753860
Implantable Lead Model: 6947
Lead Channel Impedance Value: 380 Ohm
Lead Channel Impedance Value: 4047 Ohm
Lead Channel Impedance Value: 456 Ohm
Lead Channel Pacing Threshold Amplitude: 0.75 V
Lead Channel Pacing Threshold Pulse Width: 0.4 ms
Lead Channel Setting Pacing Amplitude: 2.5 V
Lead Channel Setting Pacing Pulse Width: 0.4 ms
Lead Channel Setting Sensing Sensitivity: 0.3 mV
MDC IDC LEAD IMPLANT DT: 20080818
MDC IDC LEAD IMPLANT DT: 20080818
MDC IDC MSMT BATTERY VOLTAGE: 3.12 V
MDC IDC MSMT LEADCHNL LV IMPEDANCE VALUE: 4047 Ohm
MDC IDC MSMT LEADCHNL LV PACING THRESHOLD PULSEWIDTH: 0.4 ms
MDC IDC MSMT LEADCHNL RA SENSING INTR AMPL: 4.125 mV
MDC IDC MSMT LEADCHNL RV IMPEDANCE VALUE: 304 Ohm
MDC IDC MSMT LEADCHNL RV IMPEDANCE VALUE: 380 Ohm
MDC IDC MSMT LEADCHNL RV PACING THRESHOLD AMPLITUDE: 1 V
MDC IDC MSMT LEADCHNL RV SENSING INTR AMPL: 12.25 mV
MDC IDC SET LEADCHNL LV PACING AMPLITUDE: 2 V
MDC IDC SET LEADCHNL RA PACING AMPLITUDE: 2 V
MDC IDC SET LEADCHNL RV PACING PULSEWIDTH: 0.4 ms
MDC IDC STAT BRADY AS VP PERCENT: 41.22 %
MDC IDC STAT BRADY RA PERCENT PACED: 0 %
MDC IDC STAT BRADY RV PERCENT PACED: 41.83 %

## 2015-10-18 NOTE — Progress Notes (Signed)
Next ICM remote transmission scheduled for 11/18/2015.

## 2015-10-18 NOTE — Progress Notes (Signed)
Wound check appointment. Dermabond removed. Wound without redness or edema. Incision edges approximated, wound well healed. Normal device function. Thresholds, sensing, and impedances consistent with implant measurements. Device programmed at chronic outputs. Histogram distribution appropriate for patient and level of activity. 100% AFl burden, +Eliquis. No ventricular arrhythmias noted. Patient reports increasing LE edema x1 week, per SK, double AM dose of Lasix to 80mg  x3 days. Patient verbalizes understanding of instructions and agrees to call if her symptoms do not resolve. Patient educated about wound care, arm mobility, shock plan. ROV in 3 months with SK.

## 2015-10-18 NOTE — Patient Instructions (Signed)
Increase Lasix to 80mg  (2 tabs) in morning and 40mg  (1 tab) in afternoon for 3 days.  Call us if you develop worsening leg swelling, shortness of breath, or other symptoms.

## 2015-11-01 ENCOUNTER — Other Ambulatory Visit: Payer: Self-pay | Admitting: Family Medicine

## 2015-11-01 DIAGNOSIS — Z1231 Encounter for screening mammogram for malignant neoplasm of breast: Secondary | ICD-10-CM

## 2015-11-03 ENCOUNTER — Other Ambulatory Visit: Payer: Self-pay | Admitting: Physician Assistant

## 2015-11-15 ENCOUNTER — Ambulatory Visit
Admission: RE | Admit: 2015-11-15 | Discharge: 2015-11-15 | Disposition: A | Discharge status: Home / Self Care | Payer: Commercial Managed Care - HMO | Source: Ambulatory Visit | Attending: Family Medicine | Admitting: Family Medicine

## 2015-11-15 DIAGNOSIS — Z1231 Encounter for screening mammogram for malignant neoplasm of breast: Secondary | ICD-10-CM

## 2015-11-18 ENCOUNTER — Ambulatory Visit (INDEPENDENT_AMBULATORY_CARE_PROVIDER_SITE_OTHER): Payer: Commercial Managed Care - HMO

## 2015-11-18 DIAGNOSIS — Z95810 Presence of automatic (implantable) cardiac defibrillator: Secondary | ICD-10-CM | POA: Diagnosis not present

## 2015-11-18 DIAGNOSIS — I5022 Chronic systolic (congestive) heart failure: Secondary | ICD-10-CM

## 2015-11-19 ENCOUNTER — Telehealth: Payer: Self-pay

## 2015-11-19 NOTE — Telephone Encounter (Signed)
Remote ICM transmission received.  Attempted patient call several times and line busy.

## 2015-11-19 NOTE — Progress Notes (Signed)
EPIC Encounter for ICM Monitoring  Patient Name: Morgan Clay is a 70 y.o. female Date: 11/19/2015 Primary Care Physican: [name]., DO Primary Cardiologist: Caryl Comes Electrophysiologist: Caryl Comes Dry Weight: unknown Bi-V Pacing:  57.3%       Attempted patient call and line busy.    Thoracic impedence stable.  Gen change on 10/06/2015   ICM trend: 11/18/2015     AT/AF Graph   Follow-up plan: ICM clinic phone appointment on 12/20/2015.  Copy of ICM check sent to device physician.   [name], RN 11/19/2015 12:15 PM

## 2015-12-20 ENCOUNTER — Ambulatory Visit (INDEPENDENT_AMBULATORY_CARE_PROVIDER_SITE_OTHER): Payer: Commercial Managed Care - HMO

## 2015-12-20 ENCOUNTER — Telehealth: Payer: Self-pay

## 2015-12-20 DIAGNOSIS — Z95810 Presence of automatic (implantable) cardiac defibrillator: Secondary | ICD-10-CM | POA: Diagnosis not present

## 2015-12-20 DIAGNOSIS — I5022 Chronic systolic (congestive) heart failure: Secondary | ICD-10-CM | POA: Diagnosis not present

## 2015-12-20 NOTE — Progress Notes (Signed)
EPIC Encounter for ICM Monitoring  Patient Name: Morgan Clay is a 70 y.o. female Date: 12/20/2015 Primary Care Physican: [name]., DO Primary Cardiologist: Caryl Comes Electrophysiologist: Caryl Comes Dry Weight: unknown Bi-V Pacing:  54.4%  Time in AT/AF 24.0 hr/day (100.0%)      Attempted patient call and unable to reach.  Transmission reviewed.   Thoracic impedance abnormal suggesting fluid accumulation starting 12/02/2015.  LABS: 10/01/2015 Creatinine 1.30, BUN 16, Potassium 3.7, Sodium 141  Follow-up plan: ICM clinic phone appointment on 12/28/2015 to recheck fluid levels.   Office appointment with Dr Caryl Comes 01/11/2016.    Copy of ICM check sent to device physician.   ICM trend: 12/20/2015       [name], RN 12/20/2015 11:13 AM

## 2015-12-20 NOTE — Telephone Encounter (Signed)
Remote ICM transmission received.  Attempted patient call and no answer or answering machine.  

## 2015-12-28 ENCOUNTER — Encounter: Payer: Self-pay | Admitting: Internal Medicine

## 2015-12-28 ENCOUNTER — Telehealth: Payer: Self-pay

## 2015-12-28 ENCOUNTER — Ambulatory Visit (INDEPENDENT_AMBULATORY_CARE_PROVIDER_SITE_OTHER): Payer: Commercial Managed Care - HMO

## 2015-12-28 DIAGNOSIS — Z95810 Presence of automatic (implantable) cardiac defibrillator: Secondary | ICD-10-CM

## 2015-12-28 DIAGNOSIS — I5022 Chronic systolic (congestive) heart failure: Secondary | ICD-10-CM

## 2015-12-28 NOTE — Progress Notes (Signed)
EPIC Encounter for ICM Monitoring  Patient Name: Morgan Clay is a 70 y.o. female Date: 12/28/2015 Primary Care Physican: [name]., DO Primary Cardiologist: Caryl Comes Electrophysiologist: Caryl Comes Dry Weight:  unknown Bi-V Pacing:  65.4%             Time in AT/AF 24.0 hr/day (100.0%)           Attempted ICM call and unable to reach.  Transmission reviewed.   Thoracic impedance abnormal.  LABS: 10/01/2015 Creatinine 1.30, BUN 16, Potassium 3.7, Sodium 141  Follow-up plan: ICM clinic phone appointment on 02/11/2016.  Office appointment with Dr Caryl Comes 01/11/2016.    Copy of ICM check sent to device physician.   ICM trend: 12/28/2015       [name], RN 12/28/2015 10:54 AM

## 2015-12-28 NOTE — Telephone Encounter (Signed)
Remote ICM transmission received.  Attempted patient call and left message for return call.   

## 2016-01-06 ENCOUNTER — Encounter: Payer: Commercial Managed Care - HMO | Admitting: Family Medicine

## 2016-01-11 ENCOUNTER — Encounter: Payer: Self-pay | Admitting: Internal Medicine

## 2016-01-11 ENCOUNTER — Ambulatory Visit (INDEPENDENT_AMBULATORY_CARE_PROVIDER_SITE_OTHER): Payer: Medicare Other | Admitting: Internal Medicine

## 2016-01-11 ENCOUNTER — Encounter (INDEPENDENT_AMBULATORY_CARE_PROVIDER_SITE_OTHER): Payer: Self-pay

## 2016-01-11 VITALS — BP 140/66 | HR 88 | Ht 59.0 in | Wt 143.8 lb

## 2016-01-11 DIAGNOSIS — I429 Cardiomyopathy, unspecified: Secondary | ICD-10-CM | POA: Diagnosis not present

## 2016-01-11 DIAGNOSIS — Z95810 Presence of automatic (implantable) cardiac defibrillator: Secondary | ICD-10-CM

## 2016-01-11 DIAGNOSIS — I428 Other cardiomyopathies: Secondary | ICD-10-CM

## 2016-01-11 DIAGNOSIS — I10 Essential (primary) hypertension: Secondary | ICD-10-CM | POA: Diagnosis not present

## 2016-01-11 DIAGNOSIS — I5032 Chronic diastolic (congestive) heart failure: Secondary | ICD-10-CM | POA: Diagnosis not present

## 2016-01-11 NOTE — Progress Notes (Signed)
Patient Care Team: [name], DO as PCP - General (Family Medicine)   HPI  Morgan Clay is a 70 y.o. female Seen in followup for CRT-D implanted for nonischemic cardiomyopathy. This is undertaken 2008 with generator change 2012 and again in May 2017   She has significant improvement from her CRT-D    She has a history of atypical atrial flutter that has been associated with functional deterioration. She underwent cardioversion  8/16 and has been treated with apixaban.    Review of data from her prior device implant record May/17 demonstrates that there have been recurrent episodes of prolonged duration of this atypical atrial flutter. She's been out of rhythm that since 3/17  The patient denies chest pain, shortness of breath, nocturnal dyspnea, orthopnea or peripheral edema.  There have been no palpitations, lightheadedness or syncope.    5/17 K3.7 Cr 1.30  Myoview 12/15 no ischemia EF 33%  Repeat echocardiogram interestingly 8/16 demonstrated normal LV function 55-60% with mild LAE, mild MR and moderate AI Past Medical History:  Diagnosis Date  . AICD (automatic cardioverter/defibrillator) present   . Arthritis   . Asthma    reports mild asthma since childhood - had COPD on dx list from prior PCP  . Atypical atrial flutter ([place]) 09/28/2015  . Biventricular implantable cardiac defibrillator in situ 12/24/2006   Protecta- Medtronic  ICD gen change 2012  . Chronic systolic congestive heart failure ([place])   . DEPRESSION 12/17/2009   Annotation: PHQ-9 score = 14 done on 12/17/2009 Qualifier: Diagnosis of  By: Hassell Done FNP, Tori Milks    . FIBROIDS, UTERUS 03/05/2008   Qualifier: Diagnosis of  By: Isla Pence    . Glaucoma   . HEPATITIS C - s/p treatment with Harvoni, seeing hepatology, Dawn Drazek  01/04/2007   Qualifier: Diagnosis of  By: Hassell Done FNP, Tori Milks    . Hx of cardiovascular stress test    Lexiscan Myoview (12/15): No ischemia, EF 33%, high risk due to  depressed LV function   . Hx of echocardiogram    Echo (12/15): Severe LVH, EF 25-30%, diffuse HK, restrictive physiology, mild AI, mild MR, moderate LAE, mild RVE, mild RAE, moderate to severe TR, mild increased PASP  . Hyperlipemia 12/06/2012  . HYPERTENSION, BENIGN 04/24/2007   Qualifier: Diagnosis of  By: Hassell Done FNP, Tori Milks    . Hypothyroidism   . LBBB (left bundle branch block)   . Nonischemic cardiomyopathy ([place])    Followed by [facility]  . OBESITY 05/27/2009   Qualifier: Diagnosis of  By: Hassell Done FNP, Tori Milks    . OBSTRUCTIVE SLEEP APNEA 11/14/2007   npsg 2009:  Mild osa with AHI 11/hr. Started cpap 2009, quit using due to lack of response Home sleep testing 09/2010 with AHI only 7/hr and weight loss advised by pulmonology.  . OSTEOPENIA 09/30/2008   Qualifier: Diagnosis of  By: Hassell Done FNP, Tori Milks    . Personal history of other infectious and parasitic disease    Hepatitis B    Past Surgical History:  Procedure Laterality Date  . appendectomy    . CARDIAC CATHETERIZATION    . CARDIOVERSION N/A 12/14/2014   Procedure: CARDIOVERSION;  Surgeon: Dorothy Spark, MD;  Location: [facility] ENDOSCOPY;  Service: Cardiovascular;  Laterality: N/A;  . EP IMPLANTABLE DEVICE N/A 10/06/2015   Procedure: BIV ICD Generator Changeout;  Surgeon: [name], MD;  Location: [place] CV LAB;  Service: Cardiovascular;  Laterality: N/A;  . INSERT / REPLACE / REMOVE  PACEMAKER     concerto  . [id number]     Concerto [id number]    Current Outpatient Prescriptions  Medication Sig Dispense Refill  . albuterol (PROVENTIL HFA;VENTOLIN HFA) 108 (90 BASE) MCG/ACT inhaler Inhale 2 puffs into the lungs every 6 (six) hours as needed for wheezing or shortness of breath. 1 Inhaler 3  . carvedilol (COREG) 25 MG tablet Take 1 tablet (25 mg total) by mouth 2 (two) times daily with a meal. 180 tablet 3  . ELIQUIS 5 MG TABS tablet TAKE ONE TABLET BY MOUTH TWICE DAILY 60 tablet 10  . furosemide (LASIX) 40 MG tablet Take  1 tablet (40 mg total) by mouth 2 (two) times daily. 60 tablet 11  . isosorbide-hydrALAZINE (BIDIL) 20-37.5 MG per tablet Take 1 tablet by mouth 2 (two) times daily. 60 tablet 11  . levothyroxine (SYNTHROID, LEVOTHROID) 175 MCG tablet TAKE ONE TABLET BY MOUTH ONCE DAILY BEFORE BREAKFAST 30 tablet 5  . nitroGLYCERIN (NITROSTAT) 0.4 MG SL tablet Place 0.4 mg under the tongue every 5 (five) minutes as needed for chest pain.    . sacubitril-valsartan (ENTRESTO) 97-103 MG Take 1 tablet by mouth 2 (two) times daily. 60 tablet 11  . sertraline (ZOLOFT) 100 MG tablet Take 1 tablet (100 mg total) by mouth daily. 90 tablet 1  . traMADol (ULTRAM) 50 MG tablet 1/2 to 1 tablet as needed for severe pain not more then twice daily. (Patient taking differently: Take 50 mg by mouth every 6 (six) hours as needed for moderate pain. ) 20 tablet 0  . traZODone (DESYREL) 50 MG tablet Take 1 tablet (50 mg total) by mouth at bedtime. 90 tablet 3   No current facility-administered medications for this visit.     No Known Allergies  Review of Systems negative except from HPI and PMH  Physical Exam BP 140/66   Pulse 88   Ht 4\' 11"  (1.499 m)   Wt 143 lb 12.8 oz (65.2 kg)   SpO2 97%   BMI 29.04 kg/m  Well developed and well nourished in no acute distress HENT normal E scleral and icterus clear Neck Supple JVP 7-8 cm carotids brisk and full Device pocket well healed; without hematoma or erythema.  There is no tethering Clear to ausculation regular rate and rhythm, no murmurs gallops or rub Soft with active bowel sounds No clubbing cyanosis   Edema Alert and oriented, grossly normal motor and sensory function Skin Warm and Dry  ECG  Atrial flutter atypical with BiV pacing  Assessment and  Plan  Chronic diastolic heart failure  Nonischemic cardiomyopathy  Hypertension  CRT-D- Medtronic  The patient's device was interrogated.  The information was reviewed. No changes were made in the programming.     Atrial Flutter-atypical   Euvolemic continue current meds  BP wellcontrolled  Her functional status is vastly improved. We will reassess LV function. .No bleeding issues on apixoban.  With her absence of symptoms (feeling as well as she has in years) we will not undertake a rhythm control strategy.

## 2016-01-11 NOTE — Patient Instructions (Signed)
Medication Instructions: - Your physician recommends that you continue on your current medications as directed. Please refer to the Current Medication list given to you today.  Labwork: - none  Procedures/Testing: - Your physician has requested that you have an echocardiogram. Echocardiography is a painless test that uses sound waves to create images of your heart. It provides your doctor with information about the size and shape of your heart and how well your heart's chambers and valves are working. This procedure takes approximately one hour. There are no restrictions for this procedure.  Follow-Up: - Remote monitoring is used to monitor your Pacemaker of ICD from home. This monitoring reduces the number of office visits required to check your device to one time per year. It allows [name] to keep an eye on the functioning of your device to ensure it is working properly. You are scheduled for a device check from home on 04/11/16. You may send your transmission at any time that day. If you have a wireless device, the transmission will be sent automatically. After your physician reviews your transmission, you will receive a postcard with your next transmission date.  - Your physician wants you to follow-up in: 1 year with Dr. Caryl Comes. You will receive a reminder letter in the mail two months in advance. If you don't receive a letter, please call our office to schedule the follow-up appointment.  Any Additional Special Instructions Will Be Listed Below (If Applicable).     If you need a refill on your cardiac medications before your next appointment, please call your pharmacy.

## 2016-01-12 LAB — CUP PACEART INCLINIC DEVICE CHECK
Battery Remaining Longevity: 107 mo
Brady Statistic AP VP Percent: 0.01 %
Brady Statistic AP VS Percent: 0 %
Brady Statistic RA Percent Paced: 0.01 %
Brady Statistic RV Percent Paced: 57.69 %
Date Time Interrogation Session: 20170905183304
HighPow Impedance: 39 Ohm
HighPow Impedance: 69 Ohm
Implantable Lead Implant Date: 20080818
Implantable Lead Implant Date: 20080818
Implantable Lead Model: 6947
Lead Channel Impedance Value: 399 Ohm
Lead Channel Impedance Value: 4047 Ohm
Lead Channel Pacing Threshold Amplitude: 1 V
Lead Channel Sensing Intrinsic Amplitude: 4 mV
Lead Channel Setting Pacing Amplitude: 2 V
MDC IDC LEAD IMPLANT DT: 20080818
MDC IDC LEAD LOCATION: 753858
MDC IDC LEAD LOCATION: 753859
MDC IDC LEAD LOCATION: 753860
MDC IDC MSMT BATTERY VOLTAGE: 3.04 V
MDC IDC MSMT LEADCHNL LV IMPEDANCE VALUE: 4047 Ohm
MDC IDC MSMT LEADCHNL LV PACING THRESHOLD AMPLITUDE: 0.875 V
MDC IDC MSMT LEADCHNL LV PACING THRESHOLD PULSEWIDTH: 0.4 ms
MDC IDC MSMT LEADCHNL RA IMPEDANCE VALUE: 456 Ohm
MDC IDC MSMT LEADCHNL RV IMPEDANCE VALUE: 304 Ohm
MDC IDC MSMT LEADCHNL RV IMPEDANCE VALUE: 380 Ohm
MDC IDC MSMT LEADCHNL RV PACING THRESHOLD PULSEWIDTH: 0.4 ms
MDC IDC MSMT LEADCHNL RV SENSING INTR AMPL: 13.875 mV
MDC IDC SET LEADCHNL LV PACING PULSEWIDTH: 0.4 ms
MDC IDC SET LEADCHNL RA PACING AMPLITUDE: 2 V
MDC IDC SET LEADCHNL RV PACING AMPLITUDE: 2.5 V
MDC IDC SET LEADCHNL RV PACING PULSEWIDTH: 0.4 ms
MDC IDC SET LEADCHNL RV SENSING SENSITIVITY: 0.3 mV
MDC IDC STAT BRADY AS VP PERCENT: 56.81 %
MDC IDC STAT BRADY AS VS PERCENT: 43.18 %

## 2016-01-14 ENCOUNTER — Other Ambulatory Visit: Payer: Self-pay

## 2016-01-14 ENCOUNTER — Ambulatory Visit (HOSPITAL_COMMUNITY): Payer: Medicare Other | Attending: Cardiovascular Disease

## 2016-01-14 DIAGNOSIS — I447 Left bundle-branch block, unspecified: Secondary | ICD-10-CM | POA: Insufficient documentation

## 2016-01-14 DIAGNOSIS — J45909 Unspecified asthma, uncomplicated: Secondary | ICD-10-CM | POA: Diagnosis not present

## 2016-01-14 DIAGNOSIS — I517 Cardiomegaly: Secondary | ICD-10-CM | POA: Insufficient documentation

## 2016-01-14 DIAGNOSIS — E039 Hypothyroidism, unspecified: Secondary | ICD-10-CM | POA: Insufficient documentation

## 2016-01-14 DIAGNOSIS — I4892 Unspecified atrial flutter: Secondary | ICD-10-CM | POA: Insufficient documentation

## 2016-01-14 DIAGNOSIS — I428 Other cardiomyopathies: Secondary | ICD-10-CM | POA: Insufficient documentation

## 2016-01-14 DIAGNOSIS — I5032 Chronic diastolic (congestive) heart failure: Secondary | ICD-10-CM | POA: Insufficient documentation

## 2016-01-14 DIAGNOSIS — I340 Nonrheumatic mitral (valve) insufficiency: Secondary | ICD-10-CM | POA: Insufficient documentation

## 2016-01-14 DIAGNOSIS — G4733 Obstructive sleep apnea (adult) (pediatric): Secondary | ICD-10-CM | POA: Diagnosis not present

## 2016-01-14 DIAGNOSIS — I429 Cardiomyopathy, unspecified: Secondary | ICD-10-CM

## 2016-01-14 DIAGNOSIS — I351 Nonrheumatic aortic (valve) insufficiency: Secondary | ICD-10-CM | POA: Insufficient documentation

## 2016-01-14 LAB — ECHOCARDIOGRAM COMPLETE
AVPHT: 656 ms
CHL CUP DOP CALC LVOT VTI: 18 cm
E decel time: 201 msec
FS: 15 % — AB (ref 28–44)
IV/PV OW: 0.91
LA ID, A-P, ES: 52 mm
LA diam end sys: 52 mm
LA vol: 65 mL
LADIAMINDEX: 3.25 cm/m2
LAVOLA4C: 60 mL
LAVOLIN: 40.6 mL/m2
LV PW d: 15.6 mm — AB (ref 0.6–1.1)
LVOT SV: 46 mL
LVOT area: 2.54 cm2
LVOT diameter: 18 mm
LVOT peak grad rest: 4 mmHg
LVOT peak vel: 104 cm/s
Lateral S' vel: 12.05 cm/s
MV Dec: 201
MV VTI: 188 cm
MVPG: 6 mmHg
MVPKEVEL: 119 m/s
Reg peak vel: 246 cm/s
TRMAXVEL: 246 cm/s

## 2016-01-23 ENCOUNTER — Other Ambulatory Visit: Payer: Self-pay | Admitting: Internal Medicine

## 2016-01-23 DIAGNOSIS — I5023 Acute on chronic systolic (congestive) heart failure: Secondary | ICD-10-CM

## 2016-01-23 DIAGNOSIS — I10 Essential (primary) hypertension: Secondary | ICD-10-CM

## 2016-01-25 ENCOUNTER — Telehealth: Payer: Self-pay

## 2016-01-25 NOTE — Telephone Encounter (Signed)
Pt is aware of normal results. Pt is agreeable and understood.

## 2016-02-04 ENCOUNTER — Other Ambulatory Visit: Payer: Self-pay | Admitting: Internal Medicine

## 2016-02-07 ENCOUNTER — Other Ambulatory Visit: Payer: Self-pay

## 2016-02-07 MED ORDER — LEVOTHYROXINE SODIUM 175 MCG PO TABS: ORAL_TABLET | ORAL | 5 refills | Status: DC

## 2016-02-11 ENCOUNTER — Ambulatory Visit (INDEPENDENT_AMBULATORY_CARE_PROVIDER_SITE_OTHER): Payer: Medicare Other

## 2016-02-11 ENCOUNTER — Other Ambulatory Visit: Payer: Self-pay | Admitting: Internal Medicine

## 2016-02-11 DIAGNOSIS — I5032 Chronic diastolic (congestive) heart failure: Secondary | ICD-10-CM

## 2016-02-11 DIAGNOSIS — Z95810 Presence of automatic (implantable) cardiac defibrillator: Secondary | ICD-10-CM | POA: Diagnosis not present

## 2016-02-11 NOTE — Progress Notes (Signed)
EPIC Encounter for ICM Monitoring  Patient Name: Morgan Clay is a 70 y.o. female Date: 02/11/2016 Primary Care Physican: [name]., DO Primary Cardiologist:Klein Electrophysiologist: Caryl Comes Dry Weight: 144 lb  Bi-V Pacing:  72%      Time in AT/AF 24.0 hr/day (100.0%) Longest AT/AF 14 days AT/AF >= 6 hr for 31 days.  Heart Failure questions reviewed, pt asymptomatic   Thoracic impedance normal   Recommendations: No changes.  Advised to review food labels for salt amounts and limit to 2000 mg daily.      Follow-up plan: ICM clinic phone appointment on 03/13/2016.  Copy of ICM check sent to device physician.   ICM trend: 02/11/2016       [name], RN 02/11/2016 8:04 AM

## 2016-02-25 ENCOUNTER — Encounter: Payer: Self-pay | Admitting: Family Medicine

## 2016-02-25 ENCOUNTER — Ambulatory Visit (INDEPENDENT_AMBULATORY_CARE_PROVIDER_SITE_OTHER): Payer: Medicare Other | Admitting: Family Medicine

## 2016-02-25 VITALS — BP 120/60 | Temp 98.1°F | Ht 59.0 in | Wt 138.0 lb

## 2016-02-25 DIAGNOSIS — E785 Hyperlipidemia, unspecified: Secondary | ICD-10-CM

## 2016-02-25 DIAGNOSIS — E038 Other specified hypothyroidism: Secondary | ICD-10-CM

## 2016-02-25 DIAGNOSIS — E1122 Type 2 diabetes mellitus with diabetic chronic kidney disease: Secondary | ICD-10-CM | POA: Diagnosis not present

## 2016-02-25 DIAGNOSIS — Z0000 Encounter for general adult medical examination without abnormal findings: Secondary | ICD-10-CM

## 2016-02-25 DIAGNOSIS — I484 Atypical atrial flutter: Secondary | ICD-10-CM

## 2016-02-25 DIAGNOSIS — N183 Chronic kidney disease, stage 3 unspecified: Secondary | ICD-10-CM

## 2016-02-25 DIAGNOSIS — Z23 Encounter for immunization: Secondary | ICD-10-CM | POA: Diagnosis not present

## 2016-02-25 DIAGNOSIS — F3342 Major depressive disorder, recurrent, in full remission: Secondary | ICD-10-CM

## 2016-02-25 DIAGNOSIS — I428 Other cardiomyopathies: Secondary | ICD-10-CM

## 2016-02-25 DIAGNOSIS — Z95810 Presence of automatic (implantable) cardiac defibrillator: Secondary | ICD-10-CM

## 2016-02-25 DIAGNOSIS — I5022 Chronic systolic (congestive) heart failure: Secondary | ICD-10-CM

## 2016-02-25 DIAGNOSIS — M8580 Other specified disorders of bone density and structure, unspecified site: Secondary | ICD-10-CM

## 2016-02-25 NOTE — Progress Notes (Signed)
Pre visit review using our clinic review tool, if applicable. No additional management support is needed unless otherwise documented below in the visit note. 

## 2016-02-25 NOTE — Progress Notes (Signed)
Medicare Annual Preventive Care Visit  (initial annual wellness or annual wellness exam)  Morgan Clay is a pleasant 70 year old with a very complicated past medical significant for atrial flutter, chronic systolic CHF, and NICM, hypertension, hyperlipidemia (managed by her cardiologist), depression, diabetes, chronic kidney disease and a history of resistant hypertension(managed by nephrologist), hypothyroidism, hepatitis C (managed by hepatologist, s/p treatment) and poor compliance with follow-up here for a wellness exam. She has not been in to see [name] for a regular follow-up visit in over 6 months. She reports she has been doing well and is without any complaints. Please see scanned document for further details.  ROS: negative for report of fevers, unintentional weight loss, vision changes, vision loss, hearing loss or change, chest pain, sob, hemoptysis, melena, hematochezia, hematuria, genital discharge or lesions, falls, bleeding or bruising, loc, thoughts of suicide or self harm, memory loss  1.) Patient-completed health risk assessment  - completed and reviewed, see scanned documentation  2.) Review of Medical History: -PMH, PSH, Family History and current specialty and care providers reviewed and updated and listed below  - see scanned in document in chart and below  Past Medical History:  Diagnosis Date  . AICD (automatic cardioverter/defibrillator) present   . Arthritis   . Asthma    reports mild asthma since childhood - had COPD on dx list from prior PCP  . Atypical atrial flutter ([place]) 09/28/2015  . Biventricular implantable cardiac defibrillator in situ 12/24/2006   Protecta- Medtronic  ICD gen change 2012  . Chronic systolic congestive heart failure ([place])   . DEPRESSION 12/17/2009   Annotation: PHQ-9 score = 14 done on 12/17/2009 Qualifier: Diagnosis of  By: Hassell Done FNP, Tori Milks    . FIBROIDS, UTERUS 03/05/2008   Qualifier: Diagnosis of  By: Isla Pence    . Glaucoma    . HEPATITIS C - s/p treatment with Harvoni, seeing hepatology, Dawn Drazek  01/04/2007   Qualifier: Diagnosis of  By: Hassell Done FNP, Tori Milks    . Hx of cardiovascular stress test    Lexiscan Myoview (12/15): No ischemia, EF 33%, high risk due to depressed LV function   . Hx of echocardiogram    Echo (12/15): Severe LVH, EF 25-30%, diffuse HK, restrictive physiology, mild AI, mild MR, moderate LAE, mild RVE, mild RAE, moderate to severe TR, mild increased PASP  . Hyperlipemia 12/06/2012  . HYPERTENSION, BENIGN 04/24/2007   Qualifier: Diagnosis of  By: Hassell Done FNP, Tori Milks    . Hypothyroidism   . LBBB (left bundle branch block)   . Nonischemic cardiomyopathy ([place])    Followed by [facility]  . OBESITY 05/27/2009   Qualifier: Diagnosis of  By: Hassell Done FNP, Tori Milks    . OBSTRUCTIVE SLEEP APNEA 11/14/2007   npsg 2009:  Mild osa with AHI 11/hr. Started cpap 2009, quit using due to lack of response Home sleep testing 09/2010 with AHI only 7/hr and weight loss advised by pulmonology.  . OSTEOPENIA 09/30/2008   Qualifier: Diagnosis of  By: Hassell Done FNP, Tori Milks    . Personal history of other infectious and parasitic disease    Hepatitis B    Past Surgical History:  Procedure Laterality Date  . appendectomy    . CARDIAC CATHETERIZATION    . CARDIOVERSION N/A 12/14/2014   Procedure: CARDIOVERSION;  Surgeon: Dorothy Spark, MD;  Location: [facility] ENDOSCOPY;  Service: Cardiovascular;  Laterality: N/A;  . EP IMPLANTABLE DEVICE N/A 10/06/2015   Procedure: BIV ICD Generator Changeout;  Surgeon: [name], MD;  Location:  [place] INVASIVE CV LAB;  Service: Cardiovascular;  Laterality: N/A;  . INSERT / REPLACE / REMOVE PACEMAKER     concerto  . Z61.09     Concerto [id number]    Social History   Social History  . Marital status: Single    Spouse name: N/A  . Number of children: N/A  . Years of education: N/A   Occupational History  . Not on file.   Social History Main Topics  . Smoking status: Never  Smoker  . Smokeless tobacco: Never Used  . Alcohol use Yes     Comment: 3 glasses of wine 3-4 times per week  . Drug use: Unknown  . Sexual activity: Not on file   Other Topics Concern  . Not on file   Social History Narrative   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      Home Situation: lives alone      Spiritual Beliefs: [place]      Lifestyle: no regular exercise; poor diet             Family History  Problem Relation Age of Onset  . Asthma Father   . Heart attack Father   . Asthma Sister   . Cancer Sister     lung  . Heart attack Mother   . Stroke Brother   . Diabetes      Current Outpatient Prescriptions on File Prior to Visit  Medication Sig Dispense Refill  . albuterol (PROVENTIL HFA;VENTOLIN HFA) 108 (90 BASE) MCG/ACT inhaler Inhale 2 puffs into the lungs every 6 (six) hours as needed for wheezing or shortness of breath. 1 Inhaler 3  . carvedilol (COREG) 25 MG tablet Take 1 tablet (25 mg total) by mouth 2 (two) times daily with a meal. 180 tablet 3  . ELIQUIS 5 MG TABS tablet TAKE ONE TABLET BY MOUTH TWICE DAILY 60 tablet 10  . ENTRESTO 97-103 MG TAKE ONE TABLET BY MOUTH TWICE DAILY **TITRATED  DOSE  UP  ** 60 tablet 11  . furosemide (LASIX) 40 MG tablet Take 1 tablet (40 mg total) by mouth 2 (two) times daily. 180 tablet 3  . isosorbide-hydrALAZINE (BIDIL) 20-37.5 MG tablet Take 1 tablet by mouth 2 (two) times daily. 180 tablet 3  . levothyroxine (SYNTHROID, LEVOTHROID) 175 MCG tablet TAKE ONE TABLET BY MOUTH ONCE DAILY BEFORE BREAKFAST 30 tablet 5  . nitroGLYCERIN (NITROSTAT) 0.4 MG SL tablet Place 0.4 mg under the tongue every 5 (five) minutes as needed for chest pain.    [name] sertraline (ZOLOFT) 100 MG tablet Take 1 tablet (100 mg total) by mouth daily. 90 tablet 1  . traMADol (ULTRAM) 50 MG tablet 1/2 to 1 tablet as needed for severe pain not more then twice daily. (Patient taking differently: Take 50 mg by mouth every 6 (six) hours as needed for  moderate pain. ) 20 tablet 0  . traZODone (DESYREL) 50 MG tablet Take 1 tablet (50 mg total) by mouth at bedtime. 90 tablet 3   No current facility-administered medications on file prior to visit.      3.) Review of functional ability and level of safety:  Any difficulty hearing?  NO  History of falling?  NO  Any trouble with IADLs - using a phone, using transportation, grocery shopping, preparing meals, doing housework, doing laundry, taking medications and managing money? NO  Advance Directives? Yes, she reports this document has been scanned in at [facility]  See summary of recommendations in Patient  Instructions below.  4.) Physical Exam Vitals:   02/25/16 1525  BP: 120/60  Temp: 98.1 F (36.7 C)   Estimated body mass index is 27.87 kg/m as calculated from the following:   Height as of this encounter: 4\' 11"  (1.499 m).   Weight as of this encounter: 138 lb (62.6 kg).  EKG (optional): deferred  General: alert, appear well hydrated and in no acute distress  HEENT: visual acuity grossly intact  CV: HRRR  Lungs: CTA bilaterally  Psych: pleasant and cooperative, no obvious depression or anxiety  Cognitive function grossly intact  See patient instructions for recommendations.  Education and counseling regarding the above review of health provided with a plan for the following: -see scanned patient completed form for further details -fall prevention strategies discussed  -healthy lifestyle discussed -importance and resources for completing advanced directives discussed -see patient instructions below for any other recommendations provided  4)The following written screening schedule of preventive measures were reviewed with assessment and plan made per below, orders and patient instructions:      AAA screening done if applicable     Alcohol screening done     Obesity Screening and counseling done     STI screening (Hep C if born 1943-65) offered and per pt  wishes     Tobacco Screening done done       Pneumococcal (PPSV23 -one dose after 64, one before if risk factors), influenza yearly and hepatitis B vaccines (if high risk - end stage renal disease, IV drugs, homosexual men, live in home for mentally retarded, hemophilia receiving factors) ASSESSMENT/PLAN: done if applicable      Screening mammograph (yearly if >40) ASSESSMENT/PLAN: utd or ordered      Screening Pap smear/pelvic exam (q2 years) ASSESSMENT/PLAN: 01/2013 negative pap with hpv negative      Colorectal cancer screening (FOBT yearly or flex sig q4y or colonoscopy q10y or barium enema q4y) ASSESSMENT/PLAN: 07/2009 colonoscopy - normal      Diabetes outpatient self-management training services ASSESSMENT/PLAN: utd or done      Bone mass measurements(covered q2y if indicated - estrogen def, osteoporosis, hyperparathyroid, vertebral abnormalities, osteoporosis or steroids) ASSESSMENT/PLAN: 2014, mild osteopenia, repeat offered      Screening for glaucoma(q1y if high risk - diabetes, FH, AA and > 50 or hispanic and > 65) ASSESSMENT/PLAN: utd or advised      Medical nutritional therapy for individuals with diabetes or renal disease ASSESSMENT/PLAN: see orders      Cardiovascular screening blood tests (lipids q5y) ASSESSMENT/PLAN: see orders and labs      Diabetes screening tests ASSESSMENT/PLAN: see orders and labs   7.) Summary: -risk factors and conditions per above assessment were discussed and treatment, recommendations and referrals were offered per documentation above and orders and patient instructions.  Medicare annual wellness visit, subsequent  Controlled type 2 diabetes mellitus with chronic kidney disease, without long-term current use of insulin, unspecified CKD stage ([place]) - Plan: Hemoglobin A1c  Stage 3 chronic kidney disease - Plan: Basic metabolic panel, CBC (no diff)  Hyperlipidemia, unspecified hyperlipidemia type - Plan: Lipid Panel  Nonischemic  cardiomyopathy ([place])  Biventricular ICD (implantable cardioverter-defibrillator) in place  Chronic systolic heart failure ([place])  Other specified hypothyroidism - Plan: TSH  Atypical atrial flutter ([facility])  Recurrent major depressive disorder, in full remission ([place])  Osteopenia, unspecified location  Patient Instructions  BEFORE YOU LEAVE: -follow up: 3-4 months -flu shot -labs -please order bone density if pt wishes  We have ordered labs or  studies at this visit. It can take up to 1-2 weeks for results and processing. IF results require follow up or explanation, we will call you with instructions. Clinically stable results will be released to your [facility]. If you have not heard from [name] or cannot find your results in [facility] in 2 weeks please contact our office at [phone].  If you are not yet signed up for [facility], please consider signing up.  We recommend the following healthy lifestyle for LIFE: 1) Small portions.   Tip: eat off of a salad plate instead of a dinner plate.  Tip: if you need more or a snack choose fruits, veggies and/or a handful of nuts or seeds.  2) Eat a healthy clean diet.  * Tip: Avoid (less then 1 serving per week): processed foods, sweets, sweetened drinks, white starches (rice, flour, bread, potatoes, pasta, etc), red meat, fast foods, butter  *Tip: CHOOSE instead   * 5-9 servings per day of fresh or frozen fruits and vegetables (but not corn, potatoes, bananas, canned or dried fruit)   *nuts and seeds, beans   *olives and olive oil   *small portions of lean meats such as fish and white chicken    *small portions of whole grains  3)Get at least 150 minutes of sweaty aerobic exercise per week.  4)Reduce stress - consider counseling, meditation and relaxation to balance other aspects of your life.     [name], DO

## 2016-02-25 NOTE — Patient Instructions (Signed)
BEFORE YOU LEAVE: -follow up: 3-4 months -flu shot -labs -please order bone density if pt wishes  We have ordered labs or studies at this visit. It can take up to 1-2 weeks for results and processing. IF results require follow up or explanation, we will call you with instructions. Clinically stable results will be released to your [facility]. If you have not heard from [name] or cannot find your results in [facility] in 2 weeks please contact our office at [phone].  If you are not yet signed up for [facility], please consider signing up.  We recommend the following healthy lifestyle for LIFE: 1) Small portions.   Tip: eat off of a salad plate instead of a dinner plate.  Tip: if you need more or a snack choose fruits, veggies and/or a handful of nuts or seeds.  2) Eat a healthy clean diet.  * Tip: Avoid (less then 1 serving per week): processed foods, sweets, sweetened drinks, white starches (rice, flour, bread, potatoes, pasta, etc), red meat, fast foods, butter  *Tip: CHOOSE instead   * 5-9 servings per day of fresh or frozen fruits and vegetables (but not corn, potatoes, bananas, canned or dried fruit)   *nuts and seeds, beans   *olives and olive oil   *small portions of lean meats such as fish and white chicken    *small portions of whole grains  3)Get at least 150 minutes of sweaty aerobic exercise per week.  4)Reduce stress - consider counseling, meditation and relaxation to balance other aspects of your life.

## 2016-02-26 LAB — CBC
HCT: 47.4 % — ABNORMAL HIGH (ref 35.0–45.0)
HEMOGLOBIN: 15.5 g/dL (ref 11.7–15.5)
MCH: 29.5 pg (ref 27.0–33.0)
MCHC: 32.7 g/dL (ref 32.0–36.0)
MCV: 90.3 fL (ref 80.0–100.0)
MPV: 9.9 fL (ref 7.5–12.5)
PLATELETS: 174 10*3/uL (ref 140–400)
RBC: 5.25 MIL/uL — AB (ref 3.80–5.10)
RDW: 15.6 % — ABNORMAL HIGH (ref 11.0–15.0)
WBC: 5.5 10*3/uL (ref 3.8–10.8)

## 2016-02-26 LAB — LIPID PANEL
Cholesterol: 148 mg/dL (ref 125–200)
HDL: 82 mg/dL (ref >=46)
LDL CALC: 48 mg/dL (ref <130)
TRIGLYCERIDES: 91 mg/dL (ref <150)
Total CHOL/HDL Ratio: 1.8 Ratio (ref <=5.0)
VLDL: 18 mg/dL (ref <30)

## 2016-02-26 LAB — BASIC METABOLIC PANEL
BUN: 24 mg/dL (ref 7–25)
CHLORIDE: 101 mmol/L (ref 98–110)
CO2: 27 mmol/L (ref 20–31)
Calcium: 9.7 mg/dL (ref 8.6–10.4)
Creat: 1.26 mg/dL — ABNORMAL HIGH (ref 0.50–0.99)
Glucose, Bld: 83 mg/dL (ref 65–99)
POTASSIUM: 3.9 mmol/L (ref 3.5–5.3)
Sodium: 141 mmol/L (ref 135–146)

## 2016-02-26 LAB — HEMOGLOBIN A1C
HEMOGLOBIN A1C: 5.4 % (ref <5.7)
MEAN PLASMA GLUCOSE: 108 mg/dL

## 2016-02-26 LAB — TSH: TSH: 0.17 m[IU]/L — AB

## 2016-03-08 ENCOUNTER — Telehealth: Payer: Self-pay | Admitting: Family Medicine

## 2016-03-08 NOTE — Telephone Encounter (Signed)
Pt returning your call °Please call on cell phone °

## 2016-03-09 MED ORDER — LEVOTHYROXINE SODIUM 150 MCG PO TABS: 150.0000 ug | ORAL_TABLET | Freq: Every day | ORAL | 3 refills | Status: DC

## 2016-03-09 NOTE — Addendum Note (Signed)
Addended by: Agnes Lawrence on: 03/09/2016 01:28 PM   Modules accepted: Orders

## 2016-03-09 NOTE — Telephone Encounter (Signed)
See results note. 

## 2016-03-13 ENCOUNTER — Ambulatory Visit (INDEPENDENT_AMBULATORY_CARE_PROVIDER_SITE_OTHER): Payer: Medicare Other

## 2016-03-13 DIAGNOSIS — I5032 Chronic diastolic (congestive) heart failure: Secondary | ICD-10-CM

## 2016-03-13 DIAGNOSIS — Z95810 Presence of automatic (implantable) cardiac defibrillator: Secondary | ICD-10-CM | POA: Diagnosis not present

## 2016-03-13 NOTE — Progress Notes (Signed)
EPIC Encounter for ICM Monitoring  Patient Name: Morgan Clay is a 70 y.o. female Date: 03/13/2016 Primary Care Physican: [name]., DO Primary Cardiologist:Klein Electrophysiologist: Caryl Comes Dry Weight:    144 lb  Bi-V Pacing:  72.2%                                                      Clinical Status (11-Feb-2016 to 13-Mar-2016) Treated VT/VF 0 episodes  AT/AF 8 episodes  Time in AT/AF 24.0 hr/day (100.0%)  Observations (2) (11-Feb-2016 to 13-Mar-2016)  AT/AF >= 6 hr for 31 days.  V. Pacing less than 90%.  Heart Failure questions reviewed, pt asymptomatic   Thoracic impedance normal   Recommendations: No changes.  Advised to limit salt intake to 2000 mg daily.  Encouraged to call for fluid symptoms.    Follow-up plan: ICM clinic phone appointment on 04/12/2016.  Copy of ICM check sent to device physician.   ICM trend: 03/13/2016       [name], RN 03/13/2016 10:03 AM

## 2016-04-12 ENCOUNTER — Ambulatory Visit (INDEPENDENT_AMBULATORY_CARE_PROVIDER_SITE_OTHER): Payer: Medicare Other | Admitting: *Deleted

## 2016-04-12 ENCOUNTER — Telehealth: Payer: Self-pay | Admitting: Cardiology

## 2016-04-12 DIAGNOSIS — Z95810 Presence of automatic (implantable) cardiac defibrillator: Secondary | ICD-10-CM

## 2016-04-12 DIAGNOSIS — I428 Other cardiomyopathies: Secondary | ICD-10-CM

## 2016-04-12 DIAGNOSIS — I5032 Chronic diastolic (congestive) heart failure: Secondary | ICD-10-CM | POA: Diagnosis not present

## 2016-04-12 NOTE — Telephone Encounter (Signed)
Spoke with pt and reminded pt of remote transmission that is due today. Pt verbalized understanding.   

## 2016-04-13 MED ORDER — FUROSEMIDE 40 MG PO TABS: ORAL_TABLET | ORAL | 3 refills | Status: DC

## 2016-04-13 NOTE — Progress Notes (Signed)
EPIC Encounter for ICM Monitoring  Patient Name: Morgan Clay is a 70 y.o. female Date: 04/13/2016 Primary Care Physican: [name]., DO Primary Cardiologist:Klein Electrophysiologist: Caryl Comes Dry Weight:148lb  Bi-V Pacing: 70.8% Clinical Status (13-Mar-2016 to 12-Apr-2016) AT/AF 38 episodes  Time in AT/AF 24.0 hr/day (100.0%)       Heart Failure questions reviewed, pt asymptomatic   Thoracic impedance abnormal suggesting fluid accumulation.  She has done a lot of traveling in the last 2 weeks and not able to follow low salt diet.  Stated she is at home now and resumed low salt diet.  Impedance is starting to trend toward baseline.   LABS: 02/25/2016 Creatinine 1.26, BUN 24, Potassium 3.9, Sodium 141 10/01/2015 Creatinine 1.30, BUN 16, Potassium 3.7, Sodium 141  Recommendations:   No changes.  Reinforced to limit low salt food choices to 2000 mg day and limiting fluid intake to < 2 liters per day. Encouraged to call for fluid symptoms.    Follow-up plan: ICM clinic phone appointment on 05/16/2015.  Copy of ICM check sent to device physician.   Reviewed with Dr Caryl Comes in office.  No changes today but patient may take additional Furosemide 40 mg daily as needed.     ICM trend: 04/12/2016       [name], RN 04/13/2016 10:25 AM

## 2016-04-14 ENCOUNTER — Telehealth: Payer: Self-pay

## 2016-04-14 NOTE — Telephone Encounter (Signed)
Prior auth for Eliquis 5 mg submitted to [facility]. 

## 2016-04-20 ENCOUNTER — Telehealth: Payer: Self-pay

## 2016-04-20 ENCOUNTER — Telehealth: Payer: Self-pay | Admitting: Internal Medicine

## 2016-04-20 MED ORDER — APIXABAN 5 MG PO TABS: 5.0000 mg | ORAL_TABLET | Freq: Two times a day (BID) | ORAL | 3 refills | Status: DC

## 2016-04-20 NOTE — Telephone Encounter (Signed)
New Message   *STAT* If patient is at the pharmacy, call can be transferred to refill team.   1. Which medications need to be refilled? (please list name of each medication and dose if known) eliquis 5mg    2. Which pharmacy/location (including street and city if local pharmacy) is medication to be sent to? [facility] [place]  3. Do they need a 30 day or 90 day supply? [place]

## 2016-04-20 NOTE — Telephone Encounter (Signed)
Eliquis was denied for lack of info in their allotted time frame. I attempted to appeal it over the phone but was unable. Appeal faxed to [employer]rievances. Samples provided till appeal is overturned.

## 2016-04-20 NOTE — Telephone Encounter (Signed)
I called and spoke with the patient to inquire if her pharmacy is the [facility] in [facility] or on [address]. She states that her RX should be sent to the [facility] on [address].   RX refill for Eliquis 5 mg one tablet BID #180 w/ 3 refills sent in.

## 2016-04-25 NOTE — Progress Notes (Signed)
Remote ICD transmission.   

## 2016-04-26 ENCOUNTER — Encounter: Payer: Self-pay | Admitting: Cardiology

## 2016-04-28 ENCOUNTER — Other Ambulatory Visit (INDEPENDENT_AMBULATORY_CARE_PROVIDER_SITE_OTHER): Payer: Medicare Other

## 2016-04-28 DIAGNOSIS — E038 Other specified hypothyroidism: Secondary | ICD-10-CM | POA: Diagnosis not present

## 2016-04-28 LAB — TSH: TSH: 0.79 mIU/L

## 2016-05-09 LAB — CUP PACEART REMOTE DEVICE CHECK
Brady Statistic AP VS Percent: 0 %
Brady Statistic AS VP Percent: 69.89 %
Brady Statistic RA Percent Paced: 0.02 %
Brady Statistic RV Percent Paced: 70.79 %
HighPow Impedance: 43 Ohm
HighPow Impedance: 69 Ohm
Implantable Lead Implant Date: 20080818
Implantable Lead Location: 753858
Implantable Lead Location: 753859
Implantable Lead Location: 753860
Implantable Lead Model: 5076
Implantable Lead Model: 6947
Lead Channel Impedance Value: 304 Ohm
Lead Channel Impedance Value: 342 Ohm
Lead Channel Impedance Value: 380 Ohm
Lead Channel Impedance Value: 4047 Ohm
Lead Channel Impedance Value: 456 Ohm
Lead Channel Pacing Threshold Amplitude: 0.75 V
Lead Channel Sensing Intrinsic Amplitude: 14.125 mV
Lead Channel Sensing Intrinsic Amplitude: 3.625 mV
Lead Channel Setting Pacing Amplitude: 2 V
Lead Channel Setting Pacing Amplitude: 2.5 V
Lead Channel Setting Sensing Sensitivity: 0.3 mV
MDC IDC LEAD IMPLANT DT: 20080818
MDC IDC LEAD IMPLANT DT: 20080818
MDC IDC MSMT BATTERY REMAINING LONGEVITY: 101 mo
MDC IDC MSMT BATTERY VOLTAGE: 3.01 V
MDC IDC MSMT LEADCHNL LV IMPEDANCE VALUE: 4047 Ohm
MDC IDC MSMT LEADCHNL LV PACING THRESHOLD PULSEWIDTH: 0.4 ms
MDC IDC MSMT LEADCHNL RA SENSING INTR AMPL: 3.625 mV
MDC IDC MSMT LEADCHNL RV PACING THRESHOLD AMPLITUDE: 1 V
MDC IDC MSMT LEADCHNL RV PACING THRESHOLD PULSEWIDTH: 0.4 ms
MDC IDC MSMT LEADCHNL RV SENSING INTR AMPL: 14.125 mV
MDC IDC PG IMPLANT DT: 20170531
MDC IDC SESS DTM: 20171206062405
MDC IDC SET LEADCHNL LV PACING AMPLITUDE: 1.75 V
MDC IDC SET LEADCHNL LV PACING PULSEWIDTH: 0.4 ms
MDC IDC SET LEADCHNL RV PACING PULSEWIDTH: 0.4 ms
MDC IDC STAT BRADY AP VP PERCENT: 0.02 %
MDC IDC STAT BRADY AS VS PERCENT: 30.09 %

## 2016-05-15 ENCOUNTER — Ambulatory Visit (INDEPENDENT_AMBULATORY_CARE_PROVIDER_SITE_OTHER): Payer: Medicare Other

## 2016-05-15 DIAGNOSIS — I5032 Chronic diastolic (congestive) heart failure: Secondary | ICD-10-CM | POA: Diagnosis not present

## 2016-05-15 DIAGNOSIS — Z95810 Presence of automatic (implantable) cardiac defibrillator: Secondary | ICD-10-CM | POA: Diagnosis not present

## 2016-05-16 NOTE — Progress Notes (Signed)
EPIC Encounter for ICM Monitoring  Patient Name: Morgan Clay is a 71 y.o. female Date: 05/16/2016 Primary Care Physican: [name]., DO Primary Cardiologist:Klein Electrophysiologist: Faustino Congress Weight:unknown  Bi-V Pacing: 73.8% Clinical Status (12-Apr-2016 to 15-May-2016) Treated VT/VF 0 episodes AT/AF 275 episodes  Time in AT/AF 17.1 hr/day (71.4%)  Observations (2) (12-Apr-2016 to 15-May-2016)  AT/AF >= 6 hr for 25 days.  V. Pacing less than 90%.  Heart Failure questions reviewed, pt asymptomatic. She stated everything is fine and denied any problems at this time   Thoracic impedance normal   LABS: 02/25/2016 Creatinine 1.26, BUN 24, Potassium 3.9, Sodium 141 10/01/2015 Creatinine 1.30, BUN 16, Potassium 3.7, Sodium 141  Recommendations: No changes.  Reinforced to limit low salt food choices to 2000 mg day and limiting fluid intake to < 2 liters per day. Encouraged to call for fluid symptoms.    Follow-up plan: ICM clinic phone appointment on 06/16/2016.  Copy of ICM check sent to primary cardiologist and device physician.   3 month ICM trend : 05/15/2016   1 Year ICM trend:      [name], RN 05/16/2016 9:37 AM

## 2016-05-26 ENCOUNTER — Telehealth: Payer: Self-pay | Admitting: Internal Medicine

## 2016-05-26 NOTE — Telephone Encounter (Signed)
I called and spoke with [facility] nurse and gave her the patient's most recent vital signs and EF.

## 2016-05-26 NOTE — Telephone Encounter (Signed)
Left Noreene Larsson a VM with [employer] she will need to fax over a EF% form in order to obtain Medical Records on patient. Provided her with Medical Records fax and phone.

## 2016-05-26 NOTE — Telephone Encounter (Signed)
°  New Prob   Requesting most recent EF and baseline vital signs. Please call.

## 2016-05-30 ENCOUNTER — Ambulatory Visit (INDEPENDENT_AMBULATORY_CARE_PROVIDER_SITE_OTHER): Payer: Medicare Other | Admitting: Family Medicine

## 2016-05-30 ENCOUNTER — Encounter: Payer: Self-pay | Admitting: Family Medicine

## 2016-05-30 VITALS — BP 128/60 | HR 70 | Temp 98.7°F | Ht 59.0 in | Wt 145.5 lb

## 2016-05-30 DIAGNOSIS — I10 Essential (primary) hypertension: Secondary | ICD-10-CM | POA: Diagnosis not present

## 2016-05-30 DIAGNOSIS — E1122 Type 2 diabetes mellitus with diabetic chronic kidney disease: Secondary | ICD-10-CM | POA: Diagnosis not present

## 2016-05-30 DIAGNOSIS — Z95810 Presence of automatic (implantable) cardiac defibrillator: Secondary | ICD-10-CM

## 2016-05-30 DIAGNOSIS — E038 Other specified hypothyroidism: Secondary | ICD-10-CM

## 2016-05-30 DIAGNOSIS — I428 Other cardiomyopathies: Secondary | ICD-10-CM

## 2016-05-30 DIAGNOSIS — Z23 Encounter for immunization: Secondary | ICD-10-CM

## 2016-05-30 DIAGNOSIS — N183 Chronic kidney disease, stage 3 unspecified: Secondary | ICD-10-CM

## 2016-05-30 DIAGNOSIS — E785 Hyperlipidemia, unspecified: Secondary | ICD-10-CM

## 2016-05-30 DIAGNOSIS — I484 Atypical atrial flutter: Secondary | ICD-10-CM

## 2016-05-30 NOTE — Addendum Note (Signed)
Addended by: Agnes Lawrence on: 05/30/2016 03:47 PM   Modules accepted: Orders

## 2016-05-30 NOTE — Progress Notes (Signed)
Pre visit review using our clinic review tool, if applicable. No additional management support is needed unless otherwise documented below in the visit note. 

## 2016-05-30 NOTE — Progress Notes (Signed)
HPI:  Morgan Clay is a pleasant 71 year old with a past medical history significant for chronic systolic CHF, nonischemic cardiomyopathy, hypertension, hyperlipidemia, depression, diabetes, chronic kidney disease, resistant hypertension, hypothyroidism, hepatitis C and poor compliance here for follow-up. She sees several specialists including a cardiologist, nephrologist and hepatologist. She reports that since we lowered her thyroid dose several checks ago, she feels like she has some hot flashes occasionally and sometimes feels tired than usual and she wants to increase her dose again. She denies chest pain, shortness of breath, palpitations, fevers, malaise, constipation, skin rash. Of note, when she first came to me she had a diagnosis of diabetes. At the time she told me she was working on curves to treat this. Since that time her blood sugar labs have remained in the borderline/prediabetes range with lifestyle restrictions. Intermittently when we mention her blood sugar issues she denies this diagnosis. She again does so today. I explained the situation to her again, in that she does have borderline diabetes, that we will continue to monitor and address and treat with a healthy lifestyle and medications if worsens. She reports a healthy diet and regular exercise.  Due for Tdap, eye exam and blood pressure labs, but otherwise her labs are up-to-date.  ROS: See pertinent positives and negatives per HPI.  Past Medical History:  Diagnosis Date  . AICD (automatic cardioverter/defibrillator) present   . Arthritis   . Asthma    reports mild asthma since childhood - had COPD on dx list from prior PCP  . Atypical atrial flutter ([place]) 09/28/2015  . Biventricular implantable cardiac defibrillator in situ 12/24/2006   Protecta- [employer]  ICD gen change 2012  . Chronic systolic congestive heart failure ([place])   . DEPRESSION 12/17/2009   Annotation: PHQ-9 score = 14 done on 12/17/2009 Qualifier:  Diagnosis of  By: Hassell Done FNP, Tori Milks    . FIBROIDS, UTERUS 03/05/2008   Qualifier: Diagnosis of  By: Isla Pence    . Glaucoma   . HEPATITIS C - s/p treatment with Harvoni, seeing hepatology, Dawn Drazek  01/04/2007   Qualifier: Diagnosis of  By: Hassell Done FNP, Tori Milks    . Hx of cardiovascular stress test    Lexiscan Myoview (12/15): No ischemia, EF 33%, high risk due to depressed LV function   . Hx of echocardiogram    Echo (12/15): Severe LVH, EF 25-30%, diffuse HK, restrictive physiology, mild AI, mild MR, moderate LAE, mild RVE, mild RAE, moderate to severe TR, mild increased PASP  . Hyperlipemia 12/06/2012  . HYPERTENSION, BENIGN 04/24/2007   Qualifier: Diagnosis of  By: Hassell Done FNP, Tori Milks    . Hypothyroidism   . LBBB (left bundle branch block)   . Nonischemic cardiomyopathy ([place])    Followed by [facility]  . OBESITY 05/27/2009   Qualifier: Diagnosis of  By: Hassell Done FNP, Tori Milks    . OBSTRUCTIVE SLEEP APNEA 11/14/2007   npsg 2009:  Mild osa with AHI 11/hr. Started cpap 2009, quit using due to lack of response Home sleep testing 09/2010 with AHI only 7/hr and weight loss advised by pulmonology.  . OSTEOPENIA 09/30/2008   Qualifier: Diagnosis of  By: Hassell Done FNP, Tori Milks    . Personal history of other infectious and parasitic disease    Hepatitis B    Past Surgical History:  Procedure Laterality Date  . appendectomy    . CARDIAC CATHETERIZATION    . CARDIOVERSION N/A 12/14/2014   Procedure: CARDIOVERSION;  Surgeon: Dorothy Spark, MD;  Location: [facility];  Service:  Cardiovascular;  Laterality: N/A;  . EP IMPLANTABLE DEVICE N/A 10/06/2015   Procedure: BIV ICD Generator Changeout;  Surgeon: [name], MD;  Location: [place] [facility];  Service: Cardiovascular;  Laterality: N/A;  . INSERT / REPLACE / REMOVE PACEMAKER     concerto  . C62.37     Concerto [id number]    Family History  Problem Relation Age of Onset  . Asthma Father   . Heart attack Father   .  Asthma Sister   . Cancer Sister     lung  . Heart attack Mother   . Stroke Brother   . Diabetes      Social History   Social History  . Marital status: Single    Spouse name: N/A  . Number of children: N/A  . Years of education: N/A   Social History Main Topics  . Smoking status: Never Smoker  . Smokeless tobacco: Never Used  . Alcohol use Yes     Comment: 3 glasses of wine 3-4 times per week  . Drug use: Unknown  . Sexual activity: Not Asked   Other Topics Concern  . None   Social History Narrative   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      Home Situation: lives alone      Spiritual Beliefs: [place]      Lifestyle: no regular exercise; poor diet              Current Outpatient Prescriptions:  .  albuterol (PROVENTIL HFA;VENTOLIN HFA) 108 (90 BASE) MCG/ACT inhaler, Inhale 2 puffs into the lungs every 6 (six) hours as needed for wheezing or shortness of breath., Disp: 1 Inhaler, Rfl: 3 .  apixaban (ELIQUIS) 5 MG TABS tablet, Take 1 tablet (5 mg total) by mouth 2 (two) times daily., Disp: 180 tablet, Rfl: 3 .  carvedilol (COREG) 25 MG tablet, Take 1 tablet (25 mg total) by mouth 2 (two) times daily with a meal., Disp: 180 tablet, Rfl: 3 .  ENTRESTO 97-103 MG, TAKE ONE TABLET BY MOUTH TWICE DAILY **TITRATED  DOSE  UP  **, Disp: 60 tablet, Rfl: 11 .  furosemide (LASIX) 40 MG tablet, Take 1 tablet (40 mg total) by mouth 2 (two) times daily.  May take additional 40 mg tablet as needed for fluid symptoms., Disp: 180 tablet, Rfl: 3 .  isosorbide-hydrALAZINE (BIDIL) 20-37.5 MG tablet, Take 1 tablet by mouth 2 (two) times daily., Disp: 180 tablet, Rfl: 3 .  levothyroxine (SYNTHROID, LEVOTHROID) 150 MCG tablet, Take 1 tablet (150 mcg total) by mouth daily., Disp: 30 tablet, Rfl: 3 .  nitroGLYCERIN (NITROSTAT) 0.4 MG SL tablet, Place 0.4 mg under the tongue every 5 (five) minutes as needed for chest pain., Disp: , Rfl:  .  sertraline (ZOLOFT) 100 MG tablet,  Take 1 tablet (100 mg total) by mouth daily., Disp: 90 tablet, Rfl: 1 .  traMADol (ULTRAM) 50 MG tablet, 1/2 to 1 tablet as needed for severe pain not more then twice daily. (Patient taking differently: Take 50 mg by mouth every 6 (six) hours as needed for moderate pain. ), Disp: 20 tablet, Rfl: 0 .  traZODone (DESYREL) 50 MG tablet, Take 1 tablet (50 mg total) by mouth at bedtime., Disp: 90 tablet, Rfl: 3  EXAM:  Vitals:   05/30/16 1427  BP: 128/60  Pulse: 70  Temp: 98.7 F (37.1 C)    Body mass index is 29.39 kg/m.  GENERAL: vitals reviewed and listed above, alert,  oriented, appears well hydrated and in no acute distress  HEENT: atraumatic, conjunttiva clear, no obvious abnormalities on inspection of external nose and ears  NECK: no obvious masses on inspection  LUNGS: clear to auscultation bilaterally, no wheezes, rales or rhonchi, good air movement  CV: HRRR, no peripheral edema  MS: moves all extremities without noticeable abnormality  PSYCH: pleasant and cooperative, no obvious depression or anxiety  ASSESSMENT AND PLAN:  Discussed the following assessment and plan:  Other specified hypothyroidism - Plan: TSH, T4, Free  Controlled type 2 diabetes mellitus with chronic kidney disease, without long-term current use of insulin, unspecified CKD stage (HCC)  Stage 3 chronic kidney disease  Resistant hypertension - Plan: Basic metabolic panel, CBC (no diff)  Atypical atrial flutter (HCC)  Biventricular ICD (implantable cardioverter-defibrillator) in place  Nonischemic cardiomyopathy ([place])  Hyperlipidemia, unspecified hyperlipidemia type  -labs -reviewed borderline diet controlled diabetes dx again with her, she intermittently seems to forgets she has this and becomes somewhat defensive when it is mentioned. Again explained. Will cont to monitor and continued healthy lifestyle is encouraged. -will check free T4 and repeat tsh given her concerns -Patient advised to  return or notify a doctor immediately if symptoms worsen or persist or new concerns arise.  Patient Instructions  BEFORE YOU LEAVE: -tetanus booster -labs -follow up: 3-4 months  We have ordered labs or studies at this visit. It can take up to 1-2 weeks for results and processing. IF results require follow up or explanation, we will call you with instructions. Clinically stable results will be released to your [facility]. If you have not heard from [name] or cannot find your results in [facility] in 2 weeks please contact our office at [phone].  If you are not yet signed up for [facility], please consider signing up.   We recommend the following healthy lifestyle for LIFE: 1) Small portions.   Tip: eat off of a salad plate instead of a dinner plate.  Tip: if you need more or a snack choose fruits, veggies and/or a handful of nuts or seeds.  2) Eat a healthy clean diet.  * Tip: Avoid (less then 1 serving per week): processed foods, sweets, sweetened drinks, white starches (rice, flour, bread, potatoes, pasta, etc), red meat, fast foods, butter  *Tip: CHOOSE instead   * 5-9 servings per day of fresh or frozen fruits and vegetables (but not corn, potatoes, bananas, canned or dried fruit)   *nuts and seeds, beans   *olives and olive oil   *small portions of lean meats such as fish and white chicken    *small portions of whole grains  3)Get at least 150 minutes of sweaty aerobic exercise per week.  4)Reduce stress - consider counseling, meditation and relaxation to balance other aspects of your life.          [name], DO

## 2016-05-30 NOTE — Patient Instructions (Addendum)
BEFORE YOU LEAVE: -tetanus booster -labs -follow up: 3-4 months  Schedule eye exam.  We have ordered labs or studies at this visit. It can take up to 1-2 weeks for results and processing. IF results require follow up or explanation, we will call you with instructions. Clinically stable results will be released to your [facility]. If you have not heard from [name] or cannot find your results in [facility] in 2 weeks please contact our office at [phone].  If you are not yet signed up for [facility], please consider signing up.   We recommend the following healthy lifestyle for LIFE: 1) Small portions.   Tip: eat off of a salad plate instead of a dinner plate.  Tip: if you need more or a snack choose fruits, veggies and/or a handful of nuts or seeds.  2) Eat a healthy clean diet.  * Tip: Avoid (less then 1 serving per week): processed foods, sweets, sweetened drinks, white starches (rice, flour, bread, potatoes, pasta, etc), red meat, fast foods, butter  *Tip: CHOOSE instead   * 5-9 servings per day of fresh or frozen fruits and vegetables (but not corn, potatoes, bananas, canned or dried fruit)   *nuts and seeds, beans   *olives and olive oil   *small portions of lean meats such as fish and white chicken    *small portions of whole grains  3)Get at least 150 minutes of sweaty aerobic exercise per week.  4)Reduce stress - consider counseling, meditation and relaxation to balance other aspects of your life.

## 2016-05-31 LAB — BASIC METABOLIC PANEL
BUN: 19 mg/dL (ref 6–23)
CALCIUM: 9.5 mg/dL (ref 8.4–10.5)
CO2: 27 mEq/L (ref 19–32)
Chloride: 106 mEq/L (ref 96–112)
Creatinine, Ser: 1.24 mg/dL — ABNORMAL HIGH (ref 0.40–1.20)
GFR: 55.26 mL/min — AB (ref >60.00)
Glucose, Bld: 106 mg/dL — ABNORMAL HIGH (ref 70–99)
Potassium: 3.8 mEq/L (ref 3.5–5.1)
SODIUM: 139 meq/L (ref 135–145)

## 2016-05-31 LAB — CBC
HCT: 45.3 % (ref 36.0–46.0)
Hemoglobin: 15.2 g/dL — ABNORMAL HIGH (ref 12.0–15.0)
MCHC: 33.5 g/dL (ref 30.0–36.0)
MCV: 88.5 fl (ref 78.0–100.0)
Platelets: 176 10*3/uL (ref 150.0–400.0)
RBC: 5.12 Mil/uL — ABNORMAL HIGH (ref 3.87–5.11)
RDW: 16.4 % — AB (ref 11.5–15.5)
WBC: 6 10*3/uL (ref 4.0–10.5)

## 2016-05-31 LAB — TSH: TSH: 0.31 u[IU]/mL — AB (ref 0.35–4.50)

## 2016-05-31 LAB — T4, FREE: FREE T4: 1.03 ng/dL (ref 0.60–1.60)

## 2016-06-12 LAB — HM DIABETES EYE EXAM

## 2016-06-16 ENCOUNTER — Ambulatory Visit (INDEPENDENT_AMBULATORY_CARE_PROVIDER_SITE_OTHER): Payer: Medicare Other

## 2016-06-16 ENCOUNTER — Telehealth: Payer: Self-pay

## 2016-06-16 DIAGNOSIS — Z95810 Presence of automatic (implantable) cardiac defibrillator: Secondary | ICD-10-CM

## 2016-06-16 DIAGNOSIS — I5032 Chronic diastolic (congestive) heart failure: Secondary | ICD-10-CM | POA: Diagnosis not present

## 2016-06-16 NOTE — Progress Notes (Signed)
EPIC Encounter for ICM Monitoring  Patient Name: Morgan Clay is a 71 y.o. female Date: 06/16/2016 Primary Care Physican: [name]., DO Primary Cardiologist:Klein Electrophysiologist: Caryl Comes Dry Weight:unknown  Bi-V Pacing: 96.9%   Clinical Status (15-May-2016 to 16-Jun-2016) Treated VT/VF 0 episodes  AT/AF 89 episodes Time in AT/AF 1.4 hr/day (5.9%)  Longest AT/AF 12 minutes  Observations (2) (15-May-2016 to 16-Jun-2016)  AT/AF >= 6 hr for 3 days.  Night heart rate over 85 bpm for 7 days.      Attempted call to patient and unable to reach.  Left message to return call.  Transmission reviewed.   Thoracic impedance normal   LABS: 02/25/2016 Creatinine 1.26, BUN 24, Potassium 3.9, Sodium 141 10/01/2015 Creatinine 1.30, BUN 16, Potassium 3.7, Sodium 141  Recommendations: NONE - Unable to reach patient   Follow-up plan: ICM clinic phone appointment on 07/25/2016.  Copy of ICM check sent to device physician.   3 month ICM trend: 06/16/2016     1 Year ICM trend:      [name], RN 06/16/2016 8:35 AM

## 2016-06-16 NOTE — Telephone Encounter (Signed)
Remote ICM transmission received.  Attempted patient call and left message to return call.   

## 2016-07-25 ENCOUNTER — Ambulatory Visit (INDEPENDENT_AMBULATORY_CARE_PROVIDER_SITE_OTHER): Payer: Medicare Other | Admitting: *Deleted

## 2016-07-25 DIAGNOSIS — I5032 Chronic diastolic (congestive) heart failure: Secondary | ICD-10-CM

## 2016-07-25 DIAGNOSIS — Z95810 Presence of automatic (implantable) cardiac defibrillator: Secondary | ICD-10-CM

## 2016-07-25 DIAGNOSIS — I428 Other cardiomyopathies: Secondary | ICD-10-CM

## 2016-07-25 NOTE — Progress Notes (Signed)
Remote ICD transmission.   

## 2016-07-25 NOTE — Progress Notes (Signed)
EPIC Encounter for ICM Monitoring  Patient Name: Morgan Clay is a 71 y.o. female Date: 07/25/2016 Primary Care Physican: [name]., DO Primary Cardiologist:Klein Electrophysiologist: Caryl Comes Dry Weight:140 - 145 lbs Bi-V Pacing: 98.3%      Heart Failure questions reviewed, pt asymptomatic    Thoracic impedance normal   Prescribed and confirmed dosage: Furosemide 40 mg 1 tablet (40 mg total) by mouth 2 (two) times daily. May take additional 40 mg tablet as needed for fluid symptoms.  LABS: 06/01/2016 Creatinine 1.24, BUN 19, Potassium 3.8, Sodium 139, EGFR 55.26 02/25/2016 Creatinine 1.26, BUN 24, Potassium 3.9, Sodium 141 10/01/2015 Creatinine 1.30, BUN 16, Potassium 3.7, Sodium 141  Recommendations: No changes. Reminded to limit dietary salt intake to 2000 mg/day and fluid intake to < 2 liters/day. Encouraged to call for fluid symptoms.  Follow-up plan: ICM clinic phone appointment on 08/25/2016.  Copy of ICM check sent to device physician.   3 month ICM trend: 07/25/2016   1 Year ICM trend:      [name], RN 07/25/2016 2:11 PM

## 2016-07-26 ENCOUNTER — Encounter: Payer: Self-pay | Admitting: Cardiology

## 2016-07-26 LAB — CUP PACEART REMOTE DEVICE CHECK
Battery Remaining Longevity: 91 mo
Brady Statistic AP VP Percent: 0.17 %
Brady Statistic AS VP Percent: 98.8 %
Brady Statistic AS VS Percent: 1.02 %
Brady Statistic RV Percent Paced: 98.31 %
Date Time Interrogation Session: 20180320084224
HIGH POWER IMPEDANCE MEASURED VALUE: 69 Ohm
HighPow Impedance: 44 Ohm
Implantable Lead Implant Date: 20080818
Implantable Lead Implant Date: 20080818
Implantable Lead Location: 753858
Implantable Lead Location: 753860
Implantable Lead Model: 4193
Implantable Lead Model: 6947
Lead Channel Impedance Value: 304 Ohm
Lead Channel Impedance Value: 380 Ohm
Lead Channel Impedance Value: 4047 Ohm
Lead Channel Pacing Threshold Amplitude: 1 V
Lead Channel Pacing Threshold Amplitude: 1.25 V
Lead Channel Pacing Threshold Pulse Width: 0.4 ms
Lead Channel Sensing Intrinsic Amplitude: 20.25 mV
Lead Channel Sensing Intrinsic Amplitude: 3.5 mV
Lead Channel Setting Pacing Amplitude: 2.25 V
Lead Channel Setting Pacing Amplitude: 2.5 V
Lead Channel Setting Pacing Pulse Width: 0.4 ms
MDC IDC LEAD IMPLANT DT: 20080818
MDC IDC LEAD LOCATION: 753859
MDC IDC MSMT BATTERY VOLTAGE: 3 V
MDC IDC MSMT LEADCHNL LV IMPEDANCE VALUE: 380 Ohm
MDC IDC MSMT LEADCHNL LV IMPEDANCE VALUE: 4047 Ohm
MDC IDC MSMT LEADCHNL RA IMPEDANCE VALUE: 437 Ohm
MDC IDC MSMT LEADCHNL RA PACING THRESHOLD AMPLITUDE: 0.75 V
MDC IDC MSMT LEADCHNL RA PACING THRESHOLD PULSEWIDTH: 0.4 ms
MDC IDC MSMT LEADCHNL RA SENSING INTR AMPL: 3.5 mV
MDC IDC MSMT LEADCHNL RV PACING THRESHOLD PULSEWIDTH: 0.4 ms
MDC IDC MSMT LEADCHNL RV SENSING INTR AMPL: 20.25 mV
MDC IDC PG IMPLANT DT: 20170531
MDC IDC SET LEADCHNL LV PACING PULSEWIDTH: 0.4 ms
MDC IDC SET LEADCHNL RA PACING AMPLITUDE: 1.5 V
MDC IDC SET LEADCHNL RV SENSING SENSITIVITY: 0.3 mV
MDC IDC STAT BRADY AP VS PERCENT: 0.01 %
MDC IDC STAT BRADY RA PERCENT PACED: 0.18 %

## 2016-08-23 ENCOUNTER — Other Ambulatory Visit: Payer: Self-pay | Admitting: Family Medicine

## 2016-08-25 ENCOUNTER — Telehealth: Payer: Self-pay

## 2016-08-25 ENCOUNTER — Ambulatory Visit (INDEPENDENT_AMBULATORY_CARE_PROVIDER_SITE_OTHER): Payer: Medicare Other

## 2016-08-25 DIAGNOSIS — I5032 Chronic diastolic (congestive) heart failure: Secondary | ICD-10-CM

## 2016-08-25 DIAGNOSIS — Z95810 Presence of automatic (implantable) cardiac defibrillator: Secondary | ICD-10-CM | POA: Diagnosis not present

## 2016-08-25 NOTE — Telephone Encounter (Signed)
Remote ICM transmission received.  Attempted patient call and left detailed message regarding transmission and next ICM scheduled for 09/25/2016.  Advised to return call for any fluid symptoms or questions.

## 2016-08-25 NOTE — Progress Notes (Signed)
EPIC Encounter for ICM Monitoring  Patient Name: Morgan Clay is a 70 y.o. female Date: 08/25/2016 Primary Care Physican: KIM, HANNAH R., DO Primary Cardiologist:Klein Electrophysiologist: Klein Dry Weight:Baseline140 - 145 lbs Bi-V Pacing: 98.5%      Attempted call to patient and unable to reach.  Left detailed message regarding transmission.  Transmission reviewed.    Thoracic impedance normal   Prescribed and confirmed dosage: Furosemide 40 mg 1 tablet (40 mg total) by mouth 2 (two) times daily. May take additional 40 mg tablet as needed for fluid symptoms.  LABS: 06/01/2016 Creatinine 1.24, BUN 19, Potassium 3.8, Sodium 139, EGFR 55.26 02/25/2016 Creatinine 1.26, BUN 24, Potassium 3.9, Sodium 141 10/01/2015 Creatinine 1.30, BUN 16, Potassium 3.7, Sodium 141  Recommendations: Left voice mail with ICM number and encouraged to call for fluid symptoms.  Follow-up plan: ICM clinic phone appointment on 09/25/2016.    Copy of ICM check sent to device physician.   3 month ICM trend: 08/25/2016   1 Year ICM trend:      Laurie S Short, RN 08/25/2016 9:43 AM    

## 2016-09-25 ENCOUNTER — Ambulatory Visit (INDEPENDENT_AMBULATORY_CARE_PROVIDER_SITE_OTHER): Payer: Medicare Other

## 2016-09-25 ENCOUNTER — Telehealth: Payer: Self-pay

## 2016-09-25 DIAGNOSIS — I5032 Chronic diastolic (congestive) heart failure: Secondary | ICD-10-CM

## 2016-09-25 DIAGNOSIS — Z95810 Presence of automatic (implantable) cardiac defibrillator: Secondary | ICD-10-CM | POA: Diagnosis not present

## 2016-09-25 NOTE — Telephone Encounter (Signed)
Remote ICM transmission received.  Attempted patient call and left detailed message regarding transmission and next ICM scheduled for 10/26/2016.  Advised to return call for any fluid symptoms or questions.

## 2016-09-25 NOTE — Progress Notes (Signed)
EPIC Encounter for ICM Monitoring  Patient Name: Morgan Clay is a 71 y.o. female Date: 09/25/2016 Primary Care Physican: [name], DO Primary [place] Electrophysiologist: Caryl Comes Dry Weight: Baseline140 - 145 lbs Bi-V Pacing:  98.8%   Clinical Status (25-Aug-2016 to 25-Sep-2016) Treated VT/VF 0 episodes AT/AF 76 episodes Time in AT/AF <0.1 hr/day (0.3%)    Attempted call to patient and unable to reach.  Left detailed message regarding transmission.  Transmission reviewed.    Thoracic impedance normal.  Prescribed and confirmed dosage: Furosemide 40 mg 1 tablet (40 mg total) by mouth 2 (two) times daily. May take additional 40 mg tablet as needed for fluid symptoms.  LABS: 06/01/2016 Creatinine 1.24, BUN 19, Potassium 3.8, Sodium 139, EGFR 55.26 02/25/2016 Creatinine 1.26, BUN 24, Potassium 3.9, Sodium 141 10/01/2015 Creatinine 1.30, BUN 16, Potassium 3.7, Sodium 141  Recommendations:  Left voice mail with ICM number and encouraged to call for fluid symptoms.  Follow-up plan: ICM clinic phone appointment on 10/26/2016.    Copy of ICM check sent to device physician.   3 month ICM trend: 09/25/2016     1 Year ICM trend:      [name], RN 09/25/2016 10:12 AM

## 2016-09-26 ENCOUNTER — Ambulatory Visit (INDEPENDENT_AMBULATORY_CARE_PROVIDER_SITE_OTHER): Payer: Medicare Other | Admitting: Family Medicine

## 2016-09-26 ENCOUNTER — Encounter: Payer: Self-pay | Admitting: Family Medicine

## 2016-09-26 VITALS — BP 122/50 | HR 79 | Temp 98.3°F | Ht 59.0 in | Wt 150.1 lb

## 2016-09-26 DIAGNOSIS — E6609 Other obesity due to excess calories: Secondary | ICD-10-CM

## 2016-09-26 DIAGNOSIS — Z95810 Presence of automatic (implantable) cardiac defibrillator: Secondary | ICD-10-CM

## 2016-09-26 DIAGNOSIS — E785 Hyperlipidemia, unspecified: Secondary | ICD-10-CM | POA: Diagnosis not present

## 2016-09-26 DIAGNOSIS — E1122 Type 2 diabetes mellitus with diabetic chronic kidney disease: Secondary | ICD-10-CM

## 2016-09-26 DIAGNOSIS — N183 Chronic kidney disease, stage 3 unspecified: Secondary | ICD-10-CM

## 2016-09-26 DIAGNOSIS — Z6830 Body mass index (BMI) 30.0-30.9, adult: Secondary | ICD-10-CM

## 2016-09-26 DIAGNOSIS — I428 Other cardiomyopathies: Secondary | ICD-10-CM

## 2016-09-26 DIAGNOSIS — I484 Atypical atrial flutter: Secondary | ICD-10-CM

## 2016-09-26 DIAGNOSIS — E038 Other specified hypothyroidism: Secondary | ICD-10-CM

## 2016-09-26 DIAGNOSIS — I5022 Chronic systolic (congestive) heart failure: Secondary | ICD-10-CM

## 2016-09-26 DIAGNOSIS — I10 Essential (primary) hypertension: Secondary | ICD-10-CM

## 2016-09-26 MED ORDER — LEVOTHYROXINE SODIUM 150 MCG PO TABS: 150.0000 ug | ORAL_TABLET | Freq: Every day | ORAL | 1 refills | Status: DC

## 2016-09-26 NOTE — Progress Notes (Signed)
HPI:  Morgan Clay is a pleasant 71 y.o. here for follow up. Chronic medical problems summarized below were reviewed for changes and stability and were updated as needed below. Reports overall is doing well and feels "great". Reports eating a healthy diet and getting regular exercise. Reports pharmacy said they called [name] about refills on thyroid medication but we did not respond. I do not see a refill request in the system. Reports she has been out for 1 day. Denies CP, SOB, DOE, treatment intolerance or new symptoms.  A. Flutter, Hx chronic sCHF, NICM, HTN, HLD: -s/p BV ICD placement -sees cardiologist for management -meds: eliquis, coreg, entresto, lasix, bidil, ntg -sees nephrologist for resistant HTN and CKD  DM - diet controlled/CKD: -sees nephrologist -reports had eye exam [facility] in last 1 year -denied dx of diabetes for some time, finally seems to understand -has been well controlled for several years with diet and exercise  Hypothyroidism: -she felt was under treated at last visit -was having some hot flashes - this has resolved -TSH very mildly low, but free t4 was normal, she did not follow up for recheck as advised  MDD: -meds: zoloft, trazadone -reports mood good  Obesity: -up 5 lbs -reports exercising and eats ok  Hx Hep C: -followed by hepatologist -s/p treatment    ROS: See pertinent positives and negatives per HPI.  Past Medical History:  Diagnosis Date  . AICD (automatic cardioverter/defibrillator) present   . Arthritis   . Asthma    reports mild asthma since childhood - had COPD on dx list from prior PCP  . Atypical atrial flutter ([place]) 09/28/2015  . Biventricular implantable cardiac defibrillator in situ 12/24/2006   Protecta- Medtr[employer]  ICD gen change 2012  . Chronic systolic congestive heart failure ([place])   . DEPRESSION 12/17/2009   Annotation: PHQ-9 score = 14 done on 12/17/2009 Qualifier: Diagnosis of  By: Hassell Done FNP, Tori Milks    .  FIBROIDS, UTERUS 03/05/2008   Qualifier: Diagnosis of  By: Isla Pence    . Glaucoma   . Hepatitis C 01/04/2007   Qualifier: Diagnosis of  By: Hassell Done FNP, Tori Milks    . HEPATITIS C - s/p treatment with Harvoni, seeing hepatology, Dawn Drazek  01/04/2007   Qualifier: Diagnosis of  By: Hassell Done FNP, Tori Milks    . Hx of cardiovascular stress test    Lexiscan Myoview (12/15): No ischemia, EF 33%, high risk due to depressed LV function   . Hx of echocardiogram    Echo (12/15): Severe LVH, EF 25-30%, diffuse HK, restrictive physiology, mild AI, mild MR, moderate LAE, mild RVE, mild RAE, moderate to severe TR, mild increased PASP  . Hyperlipemia 12/06/2012  . HYPERTENSION, BENIGN 04/24/2007   Qualifier: Diagnosis of  By: Hassell Done FNP, Tori Milks    . Hypothyroidism   . LBBB (left bundle branch block)   . Nonischemic cardiomyopathy ([place])    Followed by [facility]  . OBESITY 05/27/2009   Qualifier: Diagnosis of  By: Hassell Done FNP, Tori Milks    . OBSTRUCTIVE SLEEP APNEA 11/14/2007   npsg 2009:  Mild osa with AHI 11/hr. Started cpap 2009, quit using due to lack of response Home sleep testing 09/2010 with AHI only 7/hr and weight loss advised by pulmonology.  . OSTEOPENIA 09/30/2008   Qualifier: Diagnosis of  By: Hassell Done FNP, Tori Milks    . Personal history of other infectious and parasitic disease    Hepatitis B    Past Surgical History:  Procedure Laterality Date  . appendectomy    .  CARDIAC CATHETERIZATION    . CARDIOVERSION N/A 12/14/2014   Procedure: CARDIOVERSION;  Surgeon: Dorothy Spark, MD;  Location: [facility] ENDOSCOPY;  Service: Cardiovascular;  Laterality: N/A;  . EP IMPLANTABLE DEVICE N/A 10/06/2015   Procedure: BIV ICD Generator Changeout;  Surgeon: [name], MD;  Location: [facility] CV LAB;  Service: Cardiovascular;  Laterality: N/A;  . INSERT / REPLACE / REMOVE PACEMAKER     concerto  . J62.83     Concerto [id number]    Family History  Problem Relation Age of Onset  . Asthma Father    . Heart attack Father   . Asthma Sister   . Cancer Sister        lung  . Heart attack Mother   . Stroke Brother   . Diabetes Unknown     Social History   Social History  . Marital status: Single    Spouse name: N/A  . Number of children: N/A  . Years of education: N/A   Social History Main Topics  . Smoking status: Never Smoker  . Smokeless tobacco: Never Used  . Alcohol use Yes     Comment: 3 glasses of wine 3-4 times per week  . Drug use: Unknown  . Sexual activity: Not Asked   Other Topics Concern  . None   Social History Narrative   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      Home Situation: lives alone      Spiritual Beliefs: [place]      Lifestyle: no regular exercise; poor diet              Current Outpatient Prescriptions:  .  albuterol (PROVENTIL HFA;VENTOLIN HFA) 108 (90 BASE) MCG/ACT inhaler, Inhale 2 puffs into the lungs every 6 (six) hours as needed for wheezing or shortness of breath., Disp: 1 Inhaler, Rfl: 3 .  apixaban (ELIQUIS) 5 MG TABS tablet, Take 1 tablet (5 mg total) by mouth 2 (two) times daily., Disp: 180 tablet, Rfl: 3 .  carvedilol (COREG) 25 MG tablet, Take 1 tablet (25 mg total) by mouth 2 (two) times daily with a meal., Disp: 180 tablet, Rfl: 3 .  ENTRESTO 97-103 MG, TAKE ONE TABLET BY MOUTH TWICE DAILY **TITRATED  DOSE  UP  **, Disp: 60 tablet, Rfl: 11 .  furosemide (LASIX) 40 MG tablet, Take 1 tablet (40 mg total) by mouth 2 (two) times daily.  May take additional 40 mg tablet as needed for fluid symptoms., Disp: 180 tablet, Rfl: 3 .  isosorbide-hydrALAZINE (BIDIL) 20-37.5 MG tablet, Take 1 tablet by mouth 2 (two) times daily., Disp: 180 tablet, Rfl: 3 .  levothyroxine (SYNTHROID, LEVOTHROID) 150 MCG tablet, Take 1 tablet (150 mcg total) by mouth daily., Disp: 90 tablet, Rfl: 1 .  nitroGLYCERIN (NITROSTAT) 0.4 MG SL tablet, Place 0.4 mg under the tongue every 5 (five) minutes as needed for chest pain., Disp: , Rfl:  .   sertraline (ZOLOFT) 100 MG tablet, Take 1 tablet (100 mg total) by mouth daily., Disp: 90 tablet, Rfl: 1 .  traMADol (ULTRAM) 50 MG tablet, 1/2 to 1 tablet as needed for severe pain not more then twice daily. (Patient taking differently: Take 50 mg by mouth every 6 (six) hours as needed for moderate pain. ), Disp: 20 tablet, Rfl: 0 .  traZODone (DESYREL) 50 MG tablet, Take 1 tablet (50 mg total) by mouth at bedtime., Disp: 90 tablet, Rfl: 3  EXAM:  Vitals:   09/26/16 1539  BP: (!) 122/50  Pulse: 79  Temp: 98.3 F (36.8 C)    Body mass index is 30.32 kg/m.  GENERAL: vitals reviewed and listed above, alert, oriented, appears well hydrated and in no acute distress  HEENT: atraumatic, conjunttiva clear, no obvious abnormalities on inspection of external nose and ears  NECK: no obvious masses on inspection  LUNGS: clear to auscultation bilaterally, no wheezes, rales or rhonchi, good air movement  CV: HRRR, no peripheral edema  MS: moves all extremities without noticeable abnormality  PSYCH: pleasant and cooperative, no obvious depression or anxiety  ASSESSMENT AND PLAN:  Discussed the following assessment and plan:  Other specified hypothyroidism - Plan: TSH, T4, free  Controlled type 2 diabetes mellitus with chronic kidney disease, without long-term current use of insulin, unspecified CKD stage ([place]) - Plan: Hemoglobin A1c  Stage 3 chronic kidney disease  Atypical atrial flutter (H[facility])  Nonischemic cardiomyopathy ([facility])  Biventricular ICD (implantable cardioverter-defibrillator) in place  Hyperlipidemia, unspecified hyperlipidemia type  Essential hypertension - Plan: Basic metabolic panel, CBC  Class 1 obesity due to excess calories with serious comorbidity and body mass index (BMI) of 30.0 to 30.9 in adult  Chronic systolic heart failure ([place])  -labs per orders -lifestyle recs -advised assistant to check on and ensure she has refills on thyroid  medication -Patient advised to return or notify a doctor immediately if symptoms worsen or persist or new concerns arise.  Patient Instructions  BEFORE YOU LEAVE: -follow up: in 3 months -last weight was 145lbs -check on status of thyroid medication refill - please ensure has 6 months of refills -labs  We have ordered labs or studies at this visit. It can take up to 1-2 weeks for results and processing. IF results require follow up or explanation, we will call you with instructions. Clinically stable results will be released to your [facility]. If you have not heard from [name] or cannot find your results in [facility] in 2 weeks please contact our office at [phone].  If you are not yet signed up for [facility], please consider signing up.   We recommend the following healthy lifestyle for LIFE: 1) Small portions.   Tip: eat off of a salad plate instead of a dinner plate.  Tip: It is ok to feel hungry after a meal - that likely means you ate an appropriate portion.  Tip: if you need more or a snack choose fruits, veggies and/or a handful of nuts or seeds.  2) Eat a healthy clean diet.  * Tip: Avoid (less then 1 serving per week): processed foods, sweets, sweetened drinks, white starches (rice, flour, bread, potatoes, pasta, etc), red meat, fast foods, butter  *Tip: CHOOSE instead   * 5-9 servings per day of fresh or frozen fruits and vegetables (but not corn, potatoes, bananas, canned or dried fruit)   *nuts and seeds, beans   *olives and olive oil   *small portions of lean meats such as fish and white chicken    *small portions of whole grains  3)Get at least 150 minutes of sweaty aerobic exercise per week.  4)Reduce stress - consider counseling, meditation and relaxation to balance other aspects of your life.           [name], DO

## 2016-09-26 NOTE — Patient Instructions (Addendum)
BEFORE YOU LEAVE: -follow up: in 3 months -last weight was 145lbs -check on status of thyroid medication refill - please ensure has 6 months of refills -labs  We have ordered labs or studies at this visit. It can take up to 1-2 weeks for results and processing. IF results require follow up or explanation, we will call you with instructions. Clinically stable results will be released to your [facility]. If you have not heard from [name] or cannot find your results in [facility] in 2 weeks please contact our office at [phone].  If you are not yet signed up for [facility], please consider signing up.   We recommend the following healthy lifestyle for LIFE: 1) Small portions.   Tip: eat off of a salad plate instead of a dinner plate.  Tip: It is ok to feel hungry after a meal - that likely means you ate an appropriate portion.  Tip: if you need more or a snack choose fruits, veggies and/or a handful of nuts or seeds.  2) Eat a healthy clean diet.  * Tip: Avoid (less then 1 serving per week): processed foods, sweets, sweetened drinks, white starches (rice, flour, bread, potatoes, pasta, etc), red meat, fast foods, butter  *Tip: CHOOSE instead   * 5-9 servings per day of fresh or frozen fruits and vegetables (but not corn, potatoes, bananas, canned or dried fruit)   *nuts and seeds, beans   *olives and olive oil   *small portions of lean meats such as fish and white chicken    *small portions of whole grains  3)Get at least 150 minutes of sweaty aerobic exercise per week.  4)Reduce stress - consider counseling, meditation and relaxation to balance other aspects of your life.

## 2016-09-27 LAB — HEMOGLOBIN A1C: HEMOGLOBIN A1C: 5.9 % (ref 4.6–6.5)

## 2016-09-27 LAB — BASIC METABOLIC PANEL
BUN: 27 mg/dL — ABNORMAL HIGH (ref 6–23)
CHLORIDE: 105 meq/L (ref 96–112)
CO2: 25 meq/L (ref 19–32)
CREATININE: 1.29 mg/dL — AB (ref 0.40–1.20)
Calcium: 9.9 mg/dL (ref 8.4–10.5)
GFR: 52.75 mL/min — ABNORMAL LOW (ref >60.00)
Glucose, Bld: 125 mg/dL — ABNORMAL HIGH (ref 70–99)
POTASSIUM: 3.7 meq/L (ref 3.5–5.1)
SODIUM: 139 meq/L (ref 135–145)

## 2016-09-27 LAB — CBC
HEMATOCRIT: 39.9 % (ref 36.0–46.0)
HEMOGLOBIN: 13.2 g/dL (ref 12.0–15.0)
MCHC: 33.1 g/dL (ref 30.0–36.0)
MCV: 88.1 fl (ref 78.0–100.0)
PLATELETS: 170 10*3/uL (ref 150.0–400.0)
RBC: 4.53 Mil/uL (ref 3.87–5.11)
RDW: 15.3 % (ref 11.5–15.5)
WBC: 6.2 10*3/uL (ref 4.0–10.5)

## 2016-09-27 LAB — T4, FREE: Free T4: 1.25 ng/dL (ref 0.60–1.60)

## 2016-09-27 LAB — TSH: TSH: 0.05 u[IU]/mL — ABNORMAL LOW (ref 0.35–4.50)

## 2016-09-28 MED ORDER — LEVOTHYROXINE SODIUM 137 MCG PO TABS: 137.0000 ug | ORAL_TABLET | Freq: Every day | ORAL | 3 refills | Status: DC

## 2016-09-28 NOTE — Addendum Note (Signed)
Addended by: Agnes Lawrence on: 09/28/2016 03:58 PM   Modules accepted: Orders

## 2016-09-28 NOTE — Addendum Note (Signed)
Addended by: Agnes Lawrence on: 09/28/2016 03:57 PM   Modules accepted: Orders

## 2016-10-19 ENCOUNTER — Other Ambulatory Visit: Payer: Self-pay | Admitting: Family Medicine

## 2016-10-19 DIAGNOSIS — Z1231 Encounter for screening mammogram for malignant neoplasm of breast: Secondary | ICD-10-CM

## 2016-10-26 ENCOUNTER — Ambulatory Visit (INDEPENDENT_AMBULATORY_CARE_PROVIDER_SITE_OTHER): Payer: Medicare Other | Admitting: *Deleted

## 2016-10-26 DIAGNOSIS — I5032 Chronic diastolic (congestive) heart failure: Secondary | ICD-10-CM | POA: Diagnosis not present

## 2016-10-26 DIAGNOSIS — I428 Other cardiomyopathies: Secondary | ICD-10-CM

## 2016-10-26 DIAGNOSIS — Z95810 Presence of automatic (implantable) cardiac defibrillator: Secondary | ICD-10-CM | POA: Diagnosis not present

## 2016-10-26 NOTE — Progress Notes (Signed)
EPIC Encounter for ICM Monitoring  Patient Name: Morgan Clay is a 71 y.o. female Date: 10/26/2016 Primary Care Physican: [name], DO Primary [place] Electrophysiologist: Caryl Comes Dry Weight: Baseline140 - 145 lbs Bi-V Pacing:  99.4%        Heart Failure questions reviewed, pt asymptomatic.   Thoracic impedance normal.  Prescribed dosage: Furosemide 40 mg 1 tablet (40 mg total) by mouth 2 (two) times daily. May take additional 40 mg tablet as needed for fluid symptoms.  Recommendations: No changes.  Advised to limit salt intake to 2000 mg/day and fluid intake to < 2 liters/day.  Encouraged to call for fluid symptoms.  Follow-up plan: ICM clinic phone appointment on 11/28/2016.    Copy of ICM check sent to device physician.   3 month ICM trend: 10/26/2016   1 Year ICM trend:      [name], RN 10/26/2016 10:46 AM

## 2016-10-27 ENCOUNTER — Encounter: Payer: Self-pay | Admitting: Cardiology

## 2016-11-08 LAB — CUP PACEART REMOTE DEVICE CHECK
Battery Remaining Longevity: 83 mo
Brady Statistic AP VP Percent: 0.09 %
Brady Statistic AP VS Percent: 0.01 %
Brady Statistic AS VS Percent: 0.19 %
Date Time Interrogation Session: 20180621073424
HIGH POWER IMPEDANCE MEASURED VALUE: 43 Ohm
HIGH POWER IMPEDANCE MEASURED VALUE: 68 Ohm
Implantable Lead Implant Date: 20080818
Implantable Lead Implant Date: 20080818
Implantable Lead Implant Date: 20080818
Implantable Lead Location: 753859
Implantable Lead Model: 6947
Lead Channel Impedance Value: 304 Ohm
Lead Channel Impedance Value: 4047 Ohm
Lead Channel Impedance Value: 437 Ohm
Lead Channel Pacing Threshold Amplitude: 1 V
Lead Channel Pacing Threshold Amplitude: 1 V
Lead Channel Pacing Threshold Pulse Width: 0.4 ms
Lead Channel Pacing Threshold Pulse Width: 0.4 ms
Lead Channel Sensing Intrinsic Amplitude: 19 mV
Lead Channel Sensing Intrinsic Amplitude: 19 mV
Lead Channel Setting Pacing Amplitude: 2.25 V
Lead Channel Setting Pacing Amplitude: 2.5 V
Lead Channel Setting Pacing Pulse Width: 0.4 ms
MDC IDC LEAD LOCATION: 753858
MDC IDC LEAD LOCATION: 753860
MDC IDC MSMT BATTERY VOLTAGE: 2.99 V
MDC IDC MSMT LEADCHNL LV IMPEDANCE VALUE: 380 Ohm
MDC IDC MSMT LEADCHNL LV IMPEDANCE VALUE: 4047 Ohm
MDC IDC MSMT LEADCHNL RA PACING THRESHOLD AMPLITUDE: 0.875 V
MDC IDC MSMT LEADCHNL RA PACING THRESHOLD PULSEWIDTH: 0.4 ms
MDC IDC MSMT LEADCHNL RA SENSING INTR AMPL: 3.25 mV
MDC IDC MSMT LEADCHNL RA SENSING INTR AMPL: 3.25 mV
MDC IDC MSMT LEADCHNL RV IMPEDANCE VALUE: 342 Ohm
MDC IDC PG IMPLANT DT: 20170531
MDC IDC SET LEADCHNL RA PACING AMPLITUDE: 1.75 V
MDC IDC SET LEADCHNL RV PACING PULSEWIDTH: 0.4 ms
MDC IDC SET LEADCHNL RV SENSING SENSITIVITY: 0.3 mV
MDC IDC STAT BRADY AS VP PERCENT: 99.71 %
MDC IDC STAT BRADY RA PERCENT PACED: 0.09 %
MDC IDC STAT BRADY RV PERCENT PACED: 99.42 %

## 2016-11-09 ENCOUNTER — Other Ambulatory Visit (INDEPENDENT_AMBULATORY_CARE_PROVIDER_SITE_OTHER): Payer: Medicare Other

## 2016-11-09 DIAGNOSIS — E038 Other specified hypothyroidism: Secondary | ICD-10-CM

## 2016-11-10 LAB — TSH: TSH: 0.48 u[IU]/mL (ref 0.35–4.50)

## 2016-11-15 ENCOUNTER — Ambulatory Visit
Admission: RE | Admit: 2016-11-15 | Discharge: 2016-11-15 | Disposition: A | Discharge status: Home / Self Care | Payer: Medicare Other | Source: Ambulatory Visit | Attending: Family Medicine | Admitting: Family Medicine

## 2016-11-15 DIAGNOSIS — Z1231 Encounter for screening mammogram for malignant neoplasm of breast: Secondary | ICD-10-CM | POA: Diagnosis not present

## 2016-11-28 ENCOUNTER — Ambulatory Visit (INDEPENDENT_AMBULATORY_CARE_PROVIDER_SITE_OTHER): Payer: Medicare Other

## 2016-11-28 DIAGNOSIS — I5022 Chronic systolic (congestive) heart failure: Secondary | ICD-10-CM

## 2016-11-28 DIAGNOSIS — Z95810 Presence of automatic (implantable) cardiac defibrillator: Secondary | ICD-10-CM | POA: Diagnosis not present

## 2016-11-28 NOTE — Progress Notes (Signed)
EPIC Encounter for ICM Monitoring  Patient Name: Morgan Clay is a 70 y.o. female Date: 11/28/2016 Primary Care Physican: [name], DO Primary [place] Electrophysiologist: Caryl Comes Dry Weight: Baseline140 - 145 lbs Bi-V Pacing: 99.3%        Heart Failure questions reviewed, pt asymptomatic.   Thoracic impedance normal.  Prescribed dosage: Furosemide 40 mg 1 tablet (40 mg total) by mouth 2 (two) times daily. May take additional 40 mg tablet as needed for fluid symptoms.  Recommendations: No changes.   Encouraged to call for fluid symptoms.  Follow-up plan: ICM clinic phone appointment on 12/29/2016.  Office appointment scheduled 02/23/2017 with Dr. Caryl Comes.  Copy of ICM check sent to device physician.   3 month ICM trend: 11/28/2016   1 Year ICM trend:      [name], RN 11/28/2016 11:26 AM

## 2016-12-18 DIAGNOSIS — I428 Other cardiomyopathies: Secondary | ICD-10-CM | POA: Diagnosis not present

## 2016-12-18 DIAGNOSIS — I10 Essential (primary) hypertension: Secondary | ICD-10-CM | POA: Diagnosis not present

## 2016-12-18 DIAGNOSIS — R809 Proteinuria, unspecified: Secondary | ICD-10-CM | POA: Diagnosis not present

## 2016-12-18 DIAGNOSIS — N183 Chronic kidney disease, stage 3 (moderate): Secondary | ICD-10-CM | POA: Diagnosis not present

## 2016-12-20 LAB — BASIC METABOLIC PANEL
BUN: 21 (ref 4–21)
CREATININE: 1.2 — AB (ref <=1.1)
Glucose: 106
POTASSIUM: 3.6 (ref 3.4–5.3)
Sodium: 141 (ref 137–147)

## 2016-12-26 ENCOUNTER — Encounter: Payer: Self-pay | Admitting: Family Medicine

## 2016-12-28 ENCOUNTER — Ambulatory Visit (INDEPENDENT_AMBULATORY_CARE_PROVIDER_SITE_OTHER): Payer: Medicare Other | Admitting: Family Medicine

## 2016-12-28 ENCOUNTER — Encounter: Payer: Self-pay | Admitting: Family Medicine

## 2016-12-28 VITALS — BP 136/72 | HR 79 | Temp 98.4°F | Ht 59.0 in | Wt 150.5 lb

## 2016-12-28 DIAGNOSIS — I428 Other cardiomyopathies: Secondary | ICD-10-CM | POA: Diagnosis not present

## 2016-12-28 DIAGNOSIS — E1122 Type 2 diabetes mellitus with diabetic chronic kidney disease: Secondary | ICD-10-CM

## 2016-12-28 DIAGNOSIS — E038 Other specified hypothyroidism: Secondary | ICD-10-CM | POA: Diagnosis not present

## 2016-12-28 DIAGNOSIS — I484 Atypical atrial flutter: Secondary | ICD-10-CM | POA: Diagnosis not present

## 2016-12-28 DIAGNOSIS — I5022 Chronic systolic (congestive) heart failure: Secondary | ICD-10-CM

## 2016-12-28 DIAGNOSIS — I10 Essential (primary) hypertension: Secondary | ICD-10-CM

## 2016-12-28 DIAGNOSIS — Z95810 Presence of automatic (implantable) cardiac defibrillator: Secondary | ICD-10-CM | POA: Diagnosis not present

## 2016-12-28 DIAGNOSIS — N183 Chronic kidney disease, stage 3 unspecified: Secondary | ICD-10-CM

## 2016-12-28 NOTE — Patient Instructions (Signed)
BEFORE YOU LEAVE: -follow up: AWV with Morgan Clay and follow up with Morgan Clay - Same day in 3 months (come fasting if possible for labs that day)   We recommend the following healthy lifestyle for LIFE: 1) Small portions.   Tip: eat off of a salad plate instead of a dinner plate.  Tip: It is ok to feel hungry after a meal - that likely means you ate an appropriate portion.  Tip: if you need more or a snack choose fruits, veggies and/or a handful of nuts or seeds.  2) Eat a healthy clean diet.   TRY TO EAT: -at least 5-7 servings of low sugar vegetables per day (not corn, potatoes or bananas.) -berries are the best choice if you wish to eat fruit.   -lean meets (fish, chicken or [place] breasts) -vegan proteins for some meals - beans or tofu, whole grains, nuts and seeds -Replace bad fats with good fats - good fats include: fish, nuts and seeds, canola oil, olive oil -small amounts of low fat or non fat dairy -small amounts of100 % whole grains - check the lables  AVOID: -SUGAR, sweets, anything with added sugar, corn syrup or sweeteners -if you must have a sweetener, small amounts of stevia may be best -sweetened beverages -simple starches (rice, bread, potatoes, pasta, chips, etc - small amounts of 100% whole grains are ok) -red meat, pork, butter -fried foods, fast food, processed food, excessive dairy, eggs and coconut.  3)Get at least 150 minutes of sweaty aerobic exercise per week.  4)Reduce stress - consider counseling, meditation and relaxation to balance other aspects of your life.  WE NOW OFFER   Morgan Clay's FAST TRACK!!!  SAME DAY Appointments for ACUTE CARE  Such as: Sprains, Injuries, cuts, abrasions, rashes, muscle pain, joint pain, back pain Colds, flu, sore throats, headache, allergies, cough, fever  Ear pain, sinus and eye infections Abdominal pain, nausea, vomiting, diarrhea, upset stomach Animal/insect bites  3 Easy Ways to Schedule: Walk-In  Scheduling Call in scheduling Mychart Sign-up: [email or web address]

## 2016-12-28 NOTE — Progress Notes (Signed)
HPI:  Morgan Clay is a pleasant 71 y.o. here for follow up. Chronic medical problems summarized below were reviewed for changes and stability and were updated as needed below. These issues and their treatment remain stable for the most part. Reports she is doing well and has had a good summer. No complaints today. She has not been doing as well with her diet recently and she was on vacation. She would prefer to not do or flu shot today. She checked her basic metabolic panel with her kidney doctor recently. Denies CP, SOB, DOE, treatment intolerance or new symptoms.  A. Flutter, Hx chronic sCHF, NICM, HTN, HLD: -s/p BV ICD placement -sees cardiologist for management -meds: eliquis, coreg, entresto, lasix, bidil, ntg -sees nephrologist for resistant HTN and CKD  DM - diet controlled/CKD: -sees nephrologist -reports eye exam is up-to-date -denied dx of diabetes for some time, finally seems to understand -has been well controlled for several years with diet and exercise  Hypothyroidism: -thyroid level was normal on last check  MDD: -meds: zoloft, trazadone -reports mood good  Obesity: -weight last visit 09/2016 was 150 --> 150 -reports exercising and eats ok  Hx Hep C: -followed by hepatologist -s/p treatment  ROS: See pertinent positives and negatives per HPI.  Past Medical History:  Diagnosis Date  . AICD (automatic cardioverter/defibrillator) present   . Arthritis   . Asthma    reports mild asthma since childhood - had COPD on dx list from prior PCP  . Atypical atrial flutter ([place]) 09/28/2015  . Biventricular implantable cardiac defibrillator in situ 12/24/2006   Protecta- Medtronic  ICD gen change 2012  . Chronic systolic congestive heart failure ([place])   . DEPRESSION 12/17/2009   Annotation: PHQ-9 score = 14 done on 12/17/2009 Qualifier: Diagnosis of  By: Hassell Done FNP, Tori Milks    . FIBROIDS, UTERUS 03/05/2008   Qualifier: Diagnosis of  By: Isla Pence    .  Glaucoma   . Hepatitis C 01/04/2007   Qualifier: Diagnosis of  By: Hassell Done FNP, Tori Milks    . HEPATITIS C - s/p treatment with Harvoni, seeing hepatology, Dawn Drazek  01/04/2007   Qualifier: Diagnosis of  By: Hassell Done FNP, Tori Milks    . Hx of cardiovascular stress test    Lexiscan Myoview (12/15): No ischemia, EF 33%, high risk due to depressed LV function   . Hx of echocardiogram    Echo (12/15): Severe LVH, EF 25-30%, diffuse HK, restrictive physiology, mild AI, mild MR, moderate LAE, mild RVE, mild RAE, moderate to severe TR, mild increased PASP  . Hyperlipemia 12/06/2012  . HYPERTENSION, BENIGN 04/24/2007   Qualifier: Diagnosis of  By: Hassell Done FNP, Tori Milks    . Hypothyroidism   . LBBB (left bundle branch block)   . Nonischemic cardiomyopathy ([place])    Followed by [facility]  . OBESITY 05/27/2009   Qualifier: Diagnosis of  By: Hassell Done FNP, Tori Milks    . OBSTRUCTIVE SLEEP APNEA 11/14/2007   npsg 2009:  Mild osa with AHI 11/hr. Started cpap 2009, quit using due to lack of response Home sleep testing 09/2010 with AHI only 7/hr and weight loss advised by pulmonology.  . OSTEOPENIA 09/30/2008   Qualifier: Diagnosis of  By: Hassell Done FNP, Tori Milks    . Personal history of other infectious and parasitic disease    Hepatitis B    Past Surgical History:  Procedure Laterality Date  . appendectomy    . CARDIAC CATHETERIZATION    . CARDIOVERSION N/A 12/14/2014   Procedure: CARDIOVERSION;  Surgeon: Dorothy Spark, MD;  Location: [place];  Service: Cardiovascular;  Laterality: N/A;  . EP IMPLANTABLE DEVICE N/A 10/06/2015   Procedure: BIV ICD Generator Changeout;  Surgeon: [name], MD;  Location: [place] [facility];  Service: Cardiovascular;  Laterality: N/A;  . INSERT / REPLACE / REMOVE PACEMAKER     concerto  . G81.85     Concerto [id number]    Family History  Problem Relation Age of Onset  . Asthma Father   . Heart attack Father   . Asthma Sister   . Cancer Sister        lung  .  Heart attack Mother   . Stroke Brother   . Diabetes Unknown     Social History   Social History  . Marital status: Single    Spouse name: N/A  . Number of children: N/A  . Years of education: N/A   Social History Main Topics  . Smoking status: Never Smoker  . Smokeless tobacco: Never Used  . Alcohol use Yes     Comment: 3 glasses of wine 3-4 times per week  . Drug use: Unknown  . Sexual activity: Not Asked   Other Topics Concern  . 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  albuterol (PROVENTIL HFA;VENTOLIN HFA) 108 (90 BASE) MCG/ACT inhaler, Inhale 2 puffs into the lungs every 6 (six) hours as needed for wheezing or shortness of breath., Disp: 1 Inhaler, Rfl: 3 .  apixaban (ELIQUIS) 5 MG TABS tablet, Take 1 tablet (5 mg total) by mouth 2 (two) times daily., Disp: 180 tablet, Rfl: 3 .  carvedilol (COREG) 25 MG tablet, Take 1 tablet (25 mg total) by mouth 2 (two) times daily with a meal., Disp: 180 tablet, Rfl: 3 .  ENTRESTO 97-103 MG, TAKE ONE TABLET BY MOUTH TWICE DAILY **TITRATED  DOSE  UP  **, Disp: 60 tablet, Rfl: 11 .  furosemide (LASIX) 40 MG tablet, Take 1 tablet (40 mg total) by mouth 2 (two) times daily.  May take additional 40 mg tablet as needed for fluid symptoms., Disp: 180 tablet, Rfl: 3 .  isosorbide-hydrALAZINE (BIDIL) 20-37.5 MG tablet, Take 1 tablet by mouth 2 (two) times daily., Disp: 180 tablet, Rfl: 3 .  levothyroxine (SYNTHROID, LEVOTHROID) 137 MCG tablet, Take 1 tablet (137 mcg total) by mouth daily before breakfast., Disp: 30 tablet, Rfl: 3 .  nitroGLYCERIN (NITROSTAT) 0.4 MG SL tablet, Place 0.4 mg under the tongue every 5 (five) minutes as needed for chest pain., Disp: , Rfl:  .  sertraline (ZOLOFT) 100 MG tablet, Take 1 tablet (100 mg  total) by mouth daily., Disp: 90 tablet, Rfl: 1 .  traMADol (ULTRAM) 50 MG tablet, 1/2 to 1 tablet as needed for severe pain not more then twice daily. (Patient taking differently: Take 50 mg by mouth every 6 (six) hours as needed for moderate pain. ), Disp: 20 tablet, Rfl: 0 .  traZODone (DESYREL) 50 MG tablet, Take 1 tablet (50 mg total) by mouth at bedtime., Disp: 90 tablet, Rfl: 3  EXAM:  Vitals:   12/28/16 1452  BP: 136/72  Pulse: 79  Temp: 98.4 F (36.9 C)  Body mass index is 30.4 kg/m.  GENERAL: vitals reviewed and listed above, alert, oriented, appears well hydrated and in no acute distress  HEENT: atraumatic, conjunttiva clear, no obvious abnormalities on inspection of external nose and ears  NECK: no obvious masses on inspection  LUNGS: clear to auscultation bilaterally, no wheezes, rales or rhonchi, good air movement  CV: HRRR, no peripheral edema  MS: moves all extremities without noticeable abnormality  PSYCH: pleasant and cooperative, no obvious depression or anxiety  ASSESSMENT AND PLAN:  Discussed the following assessment and plan:  Other specified hypothyroidism -she opted to hold off on rechecking a day and check at next visit with her annual wellness exam -Continue Synthroid  Biventricular ICD (implantable cardioverter-defibrillator) in place Chronic systolic heart failure (HCC) Nonischemic cardiomyopathy ([place]) Atypical atrial flutter (HCC) Resistant hypertension -sees cardiology and nephrologist for management -Asymptomatic -Kidney current medications  Stage 3 chronic kidney disease -Sees nephrologist, had labs recently  Controlled type 2 diabetes mellitus with chronic kidney disease, without long-term current use of insulin, unspecified CKD stage ([place]) -Lifestyle recommendations -advised assistant to obtain an up-to-date eye exam  Declined flu vaccine today  Due for annual wellness visit, she agrees to schedule.  -Patient advised to  return or notify a doctor immediately if symptoms worsen or persist or new concerns arise.  Patient Instructions  BEFORE YOU LEAVE: -follow up: AWV with Manuela Schwartz and follow up with Dr. [name] - Same day in 3 months (come fasting if possible for labs that day)   We recommend the following healthy lifestyle for LIFE: 1) Small portions.   Tip: eat off of a salad plate instead of a dinner plate.  Tip: It is ok to feel hungry after a meal - that likely means you ate an appropriate portion.  Tip: if you need more or a snack choose fruits, veggies and/or a handful of nuts or seeds.  2) Eat a healthy clean diet.   TRY TO EAT: -at least 5-7 servings of low sugar vegetables per day (not corn, potatoes or bananas.) -berries are the best choice if you wish to eat fruit.   -lean meets (fish, chicken or [place] breasts) -vegan proteins for some meals - beans or tofu, whole grains, nuts and seeds -Replace bad fats with good fats - good fats include: fish, nuts and seeds, canola oil, olive oil -small amounts of low fat or non fat dairy -small amounts of100 % whole grains - check the lables  AVOID: -SUGAR, sweets, anything with added sugar, corn syrup or sweeteners -if you must have a sweetener, small amounts of stevia may be best -sweetened beverages -simple starches (rice, bread, potatoes, pasta, chips, etc - small amounts of 100% whole grains are ok) -red meat, pork, butter -fried foods, fast food, processed food, excessive dairy, eggs and coconut.  3)Get at least 150 minutes of sweaty aerobic exercise per week.  4)Reduce stress - consider counseling, meditation and relaxation to balance other aspects of your life.  WE NOW OFFER   [facility] FAST TRACK!!!  SAME DAY Appointments for ACUTE CARE  Such as: Sprains, Injuries, cuts, abrasions, rashes, muscle pain, joint pain, back pain Colds, flu, sore throats, headache, allergies, cough, fever  Ear pain, sinus and eye  infections Abdominal pain, nausea, vomiting, diarrhea, upset stomach Animal/insect bites  3 Easy Ways to Schedule: Walk-In Scheduling Call in scheduling Mychart Sign-up: [email or web address]           [name], DO

## 2016-12-29 ENCOUNTER — Encounter: Payer: Self-pay | Admitting: Family Medicine

## 2016-12-29 ENCOUNTER — Ambulatory Visit (INDEPENDENT_AMBULATORY_CARE_PROVIDER_SITE_OTHER): Payer: Medicare Other

## 2016-12-29 DIAGNOSIS — I5022 Chronic systolic (congestive) heart failure: Secondary | ICD-10-CM | POA: Diagnosis not present

## 2016-12-29 DIAGNOSIS — Z95810 Presence of automatic (implantable) cardiac defibrillator: Secondary | ICD-10-CM

## 2016-12-29 NOTE — Progress Notes (Signed)
EPIC Encounter for ICM Monitoring  Patient Name: Morgan Clay is a 71 y.o. female Date: 12/29/2016 Primary Care Physican: [name], DO Primary [place] Electrophysiologist: Caryl Comes Dry Weight: Baseline140 - 145 lbs Bi-V Pacing: 99.2%       Heart Failure questions reviewed, pt asymptomatic.   Thoracic impedance normal.  She was on vacation last week.   Prescribed dosage: Furosemide 40 mg 1 tablet (40 mg total) by mouth 2 (two) times daily. May take additional 40 mg tablet as needed for fluid symptoms.  Labs: 12/20/2016 Creatinine 1.2, BUN 21, Potassium 3.6, Sodium 141 09/26/2016 Creatinine 1.29, BUN 27, Potassium 3.7, Sodium 139, EGFR 52.75  05/30/2016 Creatinine 1.24, BUN 19, Potassium 3.8, Sodium 139, EGFR 55.26   Recommendations: No changes.   Encouraged to call for fluid symptoms.  Follow-up plan: ICM clinic phone appointment on 01/29/2017.  Office appointment scheduled 02/23/2017 with Dr. Caryl Comes.  Copy of ICM check sent to Dr. Caryl Comes.   3 month ICM trend: 12/29/2016   1 Year ICM trend:      Morgan Billings, RN 12/29/2016 1:00 PM

## 2017-01-24 ENCOUNTER — Ambulatory Visit (INDEPENDENT_AMBULATORY_CARE_PROVIDER_SITE_OTHER): Payer: Medicare Other | Admitting: Adult Health

## 2017-01-24 VITALS — BP 156/64 | HR 87 | Temp 97.8°F | Wt 148.0 lb

## 2017-01-24 DIAGNOSIS — J0110 Acute frontal sinusitis, unspecified: Secondary | ICD-10-CM | POA: Diagnosis not present

## 2017-01-24 MED ORDER — HYDROCODONE-HOMATROPINE 5-1.5 MG/5ML PO SYRP: 5.0000 mL | ORAL_SOLUTION | Freq: Three times a day (TID) | ORAL | 0 refills | Status: DC | PRN

## 2017-01-24 MED ORDER — FLUTICASONE PROPIONATE 50 MCG/ACT NA SUSP: 2.0000 | Freq: Every day | NASAL | 6 refills | Status: DC

## 2017-01-24 MED ORDER — DOXYCYCLINE HYCLATE 100 MG PO CAPS: 100.0000 mg | ORAL_CAPSULE | Freq: Two times a day (BID) | ORAL | 0 refills | Status: DC

## 2017-01-24 NOTE — Progress Notes (Signed)
Subjective:    Patient ID: Morgan Clay, female    DOB: [birth year], 71 y.o.   MRN: [id number]  Sinusitis  This is a new problem. The current episode started in the past 7 days. There has been no fever. Associated symptoms include congestion, coughing (productive ), sinus pressure and a sore throat. Past treatments include oral decongestants (Robatussin ). The treatment provided no relief.      Review of Systems  Constitutional: Negative.   HENT: Positive for congestion, postnasal drip, rhinorrhea, sinus pain, sinus pressure and sore throat.   Respiratory: Positive for cough (productive ). Negative for wheezing.   Cardiovascular: Negative.   Gastrointestinal: Negative.   Musculoskeletal: Negative.   Neurological: Negative.    Past Medical History:  Diagnosis Date  . AICD (automatic cardioverter/defibrillator) present   . Arthritis   . Asthma    reports mild asthma since childhood - had COPD on dx list from prior PCP  . Atypical atrial flutter ([place]) 09/28/2015  . Biventricular implantable cardiac defibrillator in situ 12/24/2006   Protecta- Medtronic  ICD gen change 2012  . Chronic systolic congestive heart failure ([place])   . DEPRESSION 12/17/2009   Annotation: PHQ-9 score = 14 done on 12/17/2009 Qualifier: Diagnosis of  By: Hassell Done FNP, Tori Milks    . FIBROIDS, UTERUS 03/05/2008   Qualifier: Diagnosis of  By: Isla Pence    . Glaucoma   . Hepatitis C 01/04/2007   Qualifier: Diagnosis of  By: Hassell Done FNP, Tori Milks    . HEPATITIS C - s/p treatment with Harvoni, seeing hepatology, Dawn Drazek  01/04/2007   Qualifier: Diagnosis of  By: Hassell Done FNP, Tori Milks    . Hx of cardiovascular stress test    Lexiscan Myoview (12/15): No ischemia, EF 33%, high risk due to depressed LV function   . Hx of echocardiogram    Echo (12/15): Severe LVH, EF 25-30%, diffuse HK, restrictive physiology, mild AI, mild MR, moderate LAE, mild RVE, mild RAE, moderate to severe TR, mild increased PASP  .  Hyperlipemia 12/06/2012  . HYPERTENSION, BENIGN 04/24/2007   Qualifier: Diagnosis of  By: Hassell Done FNP, Tori Milks    . Hypothyroidism   . LBBB (left bundle branch block)   . Nonischemic cardiomyopathy ([place])    Followed by [facility]  . OBESITY 05/27/2009   Qualifier: Diagnosis of  By: Hassell Done FNP, Tori Milks    . OBSTRUCTIVE SLEEP APNEA 11/14/2007   npsg 2009:  Mild osa with AHI 11/hr. Started cpap 2009, quit using due to lack of response Home sleep testing 09/2010 with AHI only 7/hr and weight loss advised by pulmonology.  . OSTEOPENIA 09/30/2008   Qualifier: Diagnosis of  By: Hassell Done FNP, Tori Milks    . Personal history of other infectious and parasitic disease    Hepatitis B    Social History   Social History  . Marital status: Single    Spouse name: N/A  . Number of children: N/A  . Years of education: N/A   Occupational History  . Not on file.   Social History Main Topics  . Smoking status: Never Smoker  . Smokeless tobacco: Never Used  . Alcohol use Yes     Comment: 3 glasses of wine 3-4 times per week  . Drug use: Unknown  . Sexual activity: Not on file   Other Topics Concern  . 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 appendectomy    . CARDIAC CATHETERIZATION    . CARDIOVERSION N/A 12/14/2014   Procedure: CARDIOVERSION;  Surgeon: Dorothy Spark, MD;  Location: [facility];  Service: Cardiovascular;  Laterality: N/A;  . EP IMPLANTABLE DEVICE N/A 10/06/2015   Procedure: BIV ICD Generator Changeout;  Surgeon: [name], MD;  Location: [place] [facility];  Service: Cardiovascular;  Laterality: N/A;  . INSERT / REPLACE / REMOVE PACEMAKER     concerto  . O67.67     Concerto [id number]    Family History  Problem Relation Age of  Onset  . Asthma Father   . Heart attack Father   . Asthma Sister   . Cancer Sister        lung  . Heart attack Mother   . Stroke Brother   . Diabetes Unknown     No Known Allergies  Current Outpatient Prescriptions on File Prior to Visit  Medication Sig Dispense Refill  . albuterol (PROVENTIL HFA;VENTOLIN HFA) 108 (90 BASE) MCG/ACT inhaler Inhale 2 puffs into the lungs every 6 (six) hours as needed for wheezing or shortness of breath. 1 Inhaler 3  . apixaban (ELIQUIS) 5 MG TABS tablet Take 1 tablet (5 mg total) by mouth 2 (two) times daily. 180 tablet 3  . carvedilol (COREG) 25 MG tablet Take 1 tablet (25 mg total) by mouth 2 (two) times daily with a meal. 180 tablet 3  . ENTRESTO 97-103 MG TAKE ONE TABLET BY MOUTH TWICE DAILY **TITRATED  DOSE  UP  ** 60 tablet 11  . furosemide (LASIX) 40 MG tablet Take 1 tablet (40 mg total) by mouth 2 (two) times daily.  May take additional 40 mg tablet as needed for fluid symptoms. 180 tablet 3  . isosorbide-hydrALAZINE (BIDIL) 20-37.5 MG tablet Take 1 tablet by mouth 2 (two) times daily. 180 tablet 3  . levothyroxine (SYNTHROID, LEVOTHROID) 137 MCG tablet Take 1 tablet (137 mcg total) by mouth daily before breakfast. 30 tablet 3  . nitroGLYCERIN (NITROSTAT) 0.4 MG SL tablet Place 0.4 mg under the tongue every 5 (five) minutes as needed for chest pain.    [name] sertraline (ZOLOFT) 100 MG tablet Take 1 tablet (100 mg total) by mouth daily. 90 tablet 1  . traMADol (ULTRAM) 50 MG tablet 1/2 to 1 tablet as needed for severe pain not more then twice daily. (Patient taking differently: Take 50 mg by mouth every 6 (six) hours as needed for moderate pain. ) 20 tablet 0  . traZODone (DESYREL) 50 MG tablet Take 1 tablet (50 mg total) by mouth at bedtime. 90 tablet 3   No current facility-administered medications on file prior to visit.     BP (!) 156/64   Pulse 87   Temp 97.8 F (36.6 C) (Oral)   Wt 148 lb (67.1 kg)   SpO2 98%   BMI 29.89 kg/m         Objective:   Physical Exam  Constitutional: She is oriented to person, place, and time. She appears well-developed and well-nourished. No distress.  HENT:  Right Ear: Hearing, tympanic membrane, external ear and ear canal normal.  Left Ear: Hearing, tympanic membrane, external ear and ear canal normal.  Nose: Mucosal edema and rhinorrhea present. Right sinus exhibits frontal sinus tenderness. Right  sinus exhibits no maxillary sinus tenderness. Left sinus exhibits frontal sinus tenderness.  Mouth/Throat: Uvula is midline, oropharynx is clear and moist and mucous membranes are normal.  Cardiovascular: Normal rate, regular rhythm, normal heart sounds and intact distal pulses.  Exam reveals no gallop.   No murmur heard. Pulmonary/Chest: Effort normal and breath sounds normal. No respiratory distress. She has no wheezes. She has no rales. She exhibits no tenderness.  Lymphadenopathy:       Head (left side): Submandibular and tonsillar adenopathy present.  Neurological: She is alert and oriented to person, place, and time.  Skin: Skin is warm and dry. No rash noted. She is not diaphoretic. No erythema. No pallor.  Psychiatric: She has a normal mood and affect. Her behavior is normal. Judgment and thought content normal.  Nursing note and vitals reviewed.     Assessment & Plan:  1. Acute non-recurrent frontal sinusitis - Exam consistent with sinus infection. Will treat due to symptoms. Advised rest and hydration. Use cough syrup at night and stop Robatussin as it may be causing her blood pressure to elevate  - fluticasone (FLONASE) 50 MCG/ACT nasal spray; Place 2 sprays into both nostrils daily.  Dispense: 16 g; Refill: 6 - doxycycline (VIBRAMYCIN) 100 MG capsule; Take 1 capsule (100 mg total) by mouth 2 (two) times daily.  Dispense: 14 capsule; Refill: 0 - HYDROcodone-homatropine (HYCODAN) 5-1.5 MG/5ML syrup; Take 5 mLs by mouth every 8 (eight) hours as needed for cough.  Dispense: 120 mL;  Refill: 0 - Follow up in 2-3 days if no improvement   Dorothyann Peng, NP

## 2017-01-29 ENCOUNTER — Ambulatory Visit (INDEPENDENT_AMBULATORY_CARE_PROVIDER_SITE_OTHER): Payer: Medicare Other | Admitting: *Deleted

## 2017-01-29 DIAGNOSIS — I5022 Chronic systolic (congestive) heart failure: Secondary | ICD-10-CM

## 2017-01-29 DIAGNOSIS — I428 Other cardiomyopathies: Secondary | ICD-10-CM

## 2017-01-29 DIAGNOSIS — Z95810 Presence of automatic (implantable) cardiac defibrillator: Secondary | ICD-10-CM | POA: Diagnosis not present

## 2017-01-30 ENCOUNTER — Telehealth: Payer: Self-pay

## 2017-01-30 LAB — CUP PACEART REMOTE DEVICE CHECK
Battery Remaining Longevity: 79 mo
Battery Voltage: 2.99 V
Brady Statistic AP VP Percent: 0.1 %
Brady Statistic AS VS Percent: 0.17 %
Brady Statistic RV Percent Paced: 99.25 %
HighPow Impedance: 44 Ohm
HighPow Impedance: 68 Ohm
Implantable Lead Implant Date: 20080818
Implantable Lead Implant Date: 20080818
Implantable Lead Location: 753858
Implantable Lead Location: 753860
Implantable Lead Model: 5076
Implantable Lead Model: 6947
Implantable Pulse Generator Implant Date: 20170531
Lead Channel Impedance Value: 4047 Ohm
Lead Channel Impedance Value: 437 Ohm
Lead Channel Pacing Threshold Amplitude: 0.75 V
Lead Channel Pacing Threshold Amplitude: 1 V
Lead Channel Pacing Threshold Pulse Width: 0.4 ms
Lead Channel Pacing Threshold Pulse Width: 0.4 ms
Lead Channel Pacing Threshold Pulse Width: 0.4 ms
Lead Channel Sensing Intrinsic Amplitude: 3.25 mV
Lead Channel Setting Pacing Amplitude: 1.75 V
Lead Channel Setting Pacing Pulse Width: 0.4 ms
Lead Channel Setting Sensing Sensitivity: 0.3 mV
MDC IDC LEAD IMPLANT DT: 20080818
MDC IDC LEAD LOCATION: 753859
MDC IDC MSMT LEADCHNL LV IMPEDANCE VALUE: 399 Ohm
MDC IDC MSMT LEADCHNL LV IMPEDANCE VALUE: 4047 Ohm
MDC IDC MSMT LEADCHNL LV PACING THRESHOLD AMPLITUDE: 1.25 V
MDC IDC MSMT LEADCHNL RA SENSING INTR AMPL: 3.25 mV
MDC IDC MSMT LEADCHNL RV IMPEDANCE VALUE: 304 Ohm
MDC IDC MSMT LEADCHNL RV IMPEDANCE VALUE: 380 Ohm
MDC IDC MSMT LEADCHNL RV SENSING INTR AMPL: 16.375 mV
MDC IDC MSMT LEADCHNL RV SENSING INTR AMPL: 16.375 mV
MDC IDC SESS DTM: 20180924084223
MDC IDC SET LEADCHNL LV PACING AMPLITUDE: 2.25 V
MDC IDC SET LEADCHNL LV PACING PULSEWIDTH: 0.4 ms
MDC IDC SET LEADCHNL RV PACING AMPLITUDE: 2.5 V
MDC IDC STAT BRADY AP VS PERCENT: 0.01 %
MDC IDC STAT BRADY AS VP PERCENT: 99.73 %
MDC IDC STAT BRADY RA PERCENT PACED: 0.1 %

## 2017-01-30 NOTE — Progress Notes (Signed)
EPIC Encounter for ICM Monitoring  Patient Name: Morgan Clay is a 71 y.o. female Date: 01/30/2017 Primary Care Physican: [name], DO Primary [place] Electrophysiologist: Caryl Comes Dry Weight: Baseline140 - 145 lbs Bi-V Pacing: 99.2%  Since 29-Dec-2016: VT-NS (>4 beats, >182 bpm)    2       Attempted call to patient and unable to reach.  Left detailed message regarding transmission.  Transmission reviewed.    Thoracic impedance slightly under baseline normal suggesting start of fluid accumulation in last 2 days.  Prescribed dosage: Furosemide 40 mg 1 tablet (40 mg total) by mouth 2 (two) times daily. May take additional 40 mg tablet as needed for fluid symptoms.  Labs: 12/20/2016 Creatinine 1.2, BUN 21, Potassium 3.6, Sodium 141 09/26/2016 Creatinine 1.29, BUN 27, Potassium 3.7, Sodium 139, EGFR 52.75  05/30/2016 Creatinine 1.24, BUN 19, Potassium 3.8, Sodium 139, EGFR 55.26   Recommendations: Left voice mail with ICM number and encouraged to call for fluid symptoms.  Follow-up plan: ICM clinic phone appointment on 03/26/2017.  Office appointment scheduled 02/23/2017 with Dr. Caryl Comes.  Copy of ICM check sent to Dr. Caryl Comes.   3 month ICM trend: 01/30/2017   1 Year ICM trend:      [name], RN 01/30/2017 9:44 AM

## 2017-01-30 NOTE — Telephone Encounter (Signed)
Remote ICM transmission received.  Attempted call to patient and left detailed message regarding transmission and next ICM scheduled for 03/26/2017 and office defib check on 02/22/2017 with Dr Caryl Comes.  Advised to return call for any fluid symptoms or questions.

## 2017-01-30 NOTE — Progress Notes (Signed)
Remote ICD transmission.   

## 2017-02-01 ENCOUNTER — Encounter: Payer: Self-pay | Admitting: Cardiology

## 2017-02-08 ENCOUNTER — Other Ambulatory Visit: Payer: Self-pay | Admitting: Family Medicine

## 2017-02-10 ENCOUNTER — Other Ambulatory Visit: Payer: Self-pay | Admitting: Internal Medicine

## 2017-02-12 ENCOUNTER — Encounter: Payer: Self-pay | Admitting: Internal Medicine

## 2017-02-22 ENCOUNTER — Encounter: Payer: Self-pay | Admitting: Internal Medicine

## 2017-02-22 ENCOUNTER — Emergency Department (HOSPITAL_COMMUNITY): Payer: Medicare Other

## 2017-02-22 ENCOUNTER — Inpatient Hospital Stay (HOSPITAL_COMMUNITY)
Admission: EM | Admit: 2017-02-22 | Discharge: 2017-03-01 | DRG: 291 | Disposition: A | Discharge status: Home / Self Care | Payer: Medicare Other | Attending: Internal Medicine | Admitting: Internal Medicine

## 2017-02-22 ENCOUNTER — Ambulatory Visit (INDEPENDENT_AMBULATORY_CARE_PROVIDER_SITE_OTHER): Payer: Medicare Other | Admitting: Internal Medicine

## 2017-02-22 ENCOUNTER — Encounter (HOSPITAL_COMMUNITY): Payer: Self-pay | Admitting: Emergency Medicine

## 2017-02-22 VITALS — BP 192/64 | HR 69 | Ht 59.5 in | Wt 155.4 lb

## 2017-02-22 DIAGNOSIS — I5022 Chronic systolic (congestive) heart failure: Secondary | ICD-10-CM | POA: Diagnosis not present

## 2017-02-22 DIAGNOSIS — E1122 Type 2 diabetes mellitus with diabetic chronic kidney disease: Secondary | ICD-10-CM | POA: Diagnosis present

## 2017-02-22 DIAGNOSIS — G4733 Obstructive sleep apnea (adult) (pediatric): Secondary | ICD-10-CM | POA: Diagnosis present

## 2017-02-22 DIAGNOSIS — N179 Acute kidney failure, unspecified: Secondary | ICD-10-CM | POA: Diagnosis present

## 2017-02-22 DIAGNOSIS — E669 Obesity, unspecified: Secondary | ICD-10-CM | POA: Diagnosis present

## 2017-02-22 DIAGNOSIS — Z7901 Long term (current) use of anticoagulants: Secondary | ICD-10-CM | POA: Diagnosis not present

## 2017-02-22 DIAGNOSIS — I10 Essential (primary) hypertension: Secondary | ICD-10-CM | POA: Diagnosis not present

## 2017-02-22 DIAGNOSIS — R05 Cough: Secondary | ICD-10-CM | POA: Diagnosis not present

## 2017-02-22 DIAGNOSIS — R0600 Dyspnea, unspecified: Secondary | ICD-10-CM

## 2017-02-22 DIAGNOSIS — Z79899 Other long term (current) drug therapy: Secondary | ICD-10-CM

## 2017-02-22 DIAGNOSIS — R062 Wheezing: Secondary | ICD-10-CM

## 2017-02-22 DIAGNOSIS — Z825 Family history of asthma and other chronic lower respiratory diseases: Secondary | ICD-10-CM | POA: Diagnosis not present

## 2017-02-22 DIAGNOSIS — N183 Chronic kidney disease, stage 3 (moderate): Secondary | ICD-10-CM | POA: Diagnosis present

## 2017-02-22 DIAGNOSIS — J449 Chronic obstructive pulmonary disease, unspecified: Secondary | ICD-10-CM | POA: Diagnosis not present

## 2017-02-22 DIAGNOSIS — E876 Hypokalemia: Secondary | ICD-10-CM | POA: Diagnosis not present

## 2017-02-22 DIAGNOSIS — I5043 Acute on chronic combined systolic (congestive) and diastolic (congestive) heart failure: Secondary | ICD-10-CM | POA: Diagnosis not present

## 2017-02-22 DIAGNOSIS — Z6832 Body mass index (BMI) 32.0-32.9, adult: Secondary | ICD-10-CM

## 2017-02-22 DIAGNOSIS — I428 Other cardiomyopathies: Secondary | ICD-10-CM

## 2017-02-22 DIAGNOSIS — Z7951 Long term (current) use of inhaled steroids: Secondary | ICD-10-CM | POA: Diagnosis not present

## 2017-02-22 DIAGNOSIS — Z79890 Hormone replacement therapy (postmenopausal): Secondary | ICD-10-CM | POA: Diagnosis not present

## 2017-02-22 DIAGNOSIS — I110 Hypertensive heart disease with heart failure: Secondary | ICD-10-CM | POA: Diagnosis not present

## 2017-02-22 DIAGNOSIS — E871 Hypo-osmolality and hyponatremia: Secondary | ICD-10-CM | POA: Diagnosis not present

## 2017-02-22 DIAGNOSIS — I517 Cardiomegaly: Secondary | ICD-10-CM | POA: Diagnosis not present

## 2017-02-22 DIAGNOSIS — R059 Cough, unspecified: Secondary | ICD-10-CM

## 2017-02-22 DIAGNOSIS — H409 Unspecified glaucoma: Secondary | ICD-10-CM | POA: Diagnosis not present

## 2017-02-22 DIAGNOSIS — I130 Hypertensive heart and chronic kidney disease with heart failure and stage 1 through stage 4 chronic kidney disease, or unspecified chronic kidney disease: Secondary | ICD-10-CM | POA: Diagnosis not present

## 2017-02-22 DIAGNOSIS — Z833 Family history of diabetes mellitus: Secondary | ICD-10-CM

## 2017-02-22 DIAGNOSIS — Z9119 Patient's noncompliance with other medical treatment and regimen: Secondary | ICD-10-CM

## 2017-02-22 DIAGNOSIS — E785 Hyperlipidemia, unspecified: Secondary | ICD-10-CM | POA: Diagnosis not present

## 2017-02-22 DIAGNOSIS — Z95810 Presence of automatic (implantable) cardiac defibrillator: Secondary | ICD-10-CM | POA: Diagnosis present

## 2017-02-22 DIAGNOSIS — Z8249 Family history of ischemic heart disease and other diseases of the circulatory system: Secondary | ICD-10-CM

## 2017-02-22 DIAGNOSIS — I361 Nonrheumatic tricuspid (valve) insufficiency: Secondary | ICD-10-CM | POA: Diagnosis not present

## 2017-02-22 DIAGNOSIS — E039 Hypothyroidism, unspecified: Secondary | ICD-10-CM | POA: Diagnosis not present

## 2017-02-22 DIAGNOSIS — I429 Cardiomyopathy, unspecified: Secondary | ICD-10-CM | POA: Diagnosis not present

## 2017-02-22 DIAGNOSIS — I5023 Acute on chronic systolic (congestive) heart failure: Secondary | ICD-10-CM | POA: Diagnosis not present

## 2017-02-22 DIAGNOSIS — I5032 Chronic diastolic (congestive) heart failure: Secondary | ICD-10-CM

## 2017-02-22 DIAGNOSIS — T502X5A Adverse effect of carbonic-anhydrase inhibitors, benzothiadiazides and other diuretics, initial encounter: Secondary | ICD-10-CM | POA: Diagnosis not present

## 2017-02-22 DIAGNOSIS — I160 Hypertensive urgency: Secondary | ICD-10-CM | POA: Diagnosis not present

## 2017-02-22 DIAGNOSIS — I340 Nonrheumatic mitral (valve) insufficiency: Secondary | ICD-10-CM | POA: Diagnosis not present

## 2017-02-22 DIAGNOSIS — I509 Heart failure, unspecified: Secondary | ICD-10-CM

## 2017-02-22 DIAGNOSIS — E1129 Type 2 diabetes mellitus with other diabetic kidney complication: Secondary | ICD-10-CM | POA: Diagnosis present

## 2017-02-22 DIAGNOSIS — F329 Major depressive disorder, single episode, unspecified: Secondary | ICD-10-CM | POA: Diagnosis present

## 2017-02-22 DIAGNOSIS — I484 Atypical atrial flutter: Secondary | ICD-10-CM | POA: Diagnosis not present

## 2017-02-22 DIAGNOSIS — E038 Other specified hypothyroidism: Secondary | ICD-10-CM | POA: Diagnosis not present

## 2017-02-22 DIAGNOSIS — F419 Anxiety disorder, unspecified: Secondary | ICD-10-CM | POA: Diagnosis present

## 2017-02-22 LAB — CBC
HEMATOCRIT: 38.3 % (ref 36.0–46.0)
Hemoglobin: 12.5 g/dL (ref 12.0–15.0)
MCH: 28.7 pg (ref 26.0–34.0)
MCHC: 32.6 g/dL (ref 30.0–36.0)
MCV: 87.8 fL (ref 78.0–100.0)
Platelets: 181 10*3/uL (ref 150–400)
RBC: 4.36 MIL/uL (ref 3.87–5.11)
RDW: 15.2 % (ref 11.5–15.5)
WBC: 6.2 10*3/uL (ref 4.0–10.5)

## 2017-02-22 LAB — BASIC METABOLIC PANEL
Anion gap: 12 (ref 5–15)
BUN: 14 mg/dL (ref 6–20)
CHLORIDE: 101 mmol/L (ref 101–111)
CO2: 23 mmol/L (ref 22–32)
Calcium: 9.3 mg/dL (ref 8.9–10.3)
Creatinine, Ser: 1.37 mg/dL — ABNORMAL HIGH (ref 0.44–1.00)
GFR calc Af Amer: 44 mL/min — ABNORMAL LOW (ref >60)
GFR calc non Af Amer: 38 mL/min — ABNORMAL LOW (ref >60)
GLUCOSE: 125 mg/dL — AB (ref 65–99)
POTASSIUM: 3.6 mmol/L (ref 3.5–5.1)
Sodium: 136 mmol/L (ref 135–145)

## 2017-02-22 LAB — BRAIN NATRIURETIC PEPTIDE: B Natriuretic Peptide: 1275.8 pg/mL — ABNORMAL HIGH (ref 0.0–100.0)

## 2017-02-22 LAB — I-STAT TROPONIN, ED: Troponin i, poc: 0.03 ng/mL (ref 0.00–0.08)

## 2017-02-22 LAB — TROPONIN I: Troponin I: 0.04 ng/mL (ref <0.03)

## 2017-02-22 MED ORDER — CLONIDINE HCL 0.1 MG PO TABS
0.1000 mg | ORAL_TABLET | Freq: Once | ORAL | Status: AC
  Administered 2017-02-22: 0.1 mg via ORAL

## 2017-02-22 MED ORDER — NITROGLYCERIN 2 % TD OINT
1.0000 [in_us] | TOPICAL_OINTMENT | Freq: Four times a day (QID) | TRANSDERMAL | Status: DC
  Administered 2017-02-22 – 2017-02-23 (×2): 1 [in_us] via TOPICAL
  Filled 2017-02-22: qty 1
  Filled 2017-02-22: qty 30

## 2017-02-22 MED ORDER — FUROSEMIDE 10 MG/ML IJ SOLN
40.0000 mg | Freq: Once | INTRAMUSCULAR | Status: AC
  Administered 2017-02-22: 40 mg via INTRAVENOUS
  Filled 2017-02-22: qty 4

## 2017-02-22 MED ORDER — NITROGLYCERIN 2 % TD OINT: 1.0000 [in_us] | TOPICAL_OINTMENT | Freq: Four times a day (QID) | TRANSDERMAL | Status: DC

## 2017-02-22 MED ORDER — BENZONATATE 100 MG PO CAPS
100.0000 mg | ORAL_CAPSULE | Freq: Once | ORAL | Status: AC
  Administered 2017-02-22: 100 mg via ORAL
  Filled 2017-02-22: qty 1

## 2017-02-22 NOTE — Patient Instructions (Addendum)
Medication Instructions:  Your physician recommends that you continue on your current medications as directed. Please refer to the Current Medication list given to you today.  Labwork: None ordered  Testing/Procedures: None ordered  Follow-Up: Remote monitoring is used to monitor your ICD from home. This monitoring reduces the number of office visits required to check your device to one time per year. It allows [name] to keep an eye on the functioning of your device to ensure it is working properly. You are scheduled for a device check from home on 05/02/2017. You may send your transmission at any time that day. If you have a wireless device, the transmission will be sent automatically. After your physician reviews your transmission, you will receive a postcard with your next transmission date.  --- If you need a refill on your cardiac medications before your next appointment, please call your pharmacy. ---  Thank you for choosing [facility]!!   [phone]) [id number]  Any Other Special Instructions Will Be Listed Below (If Applicable).   We are sending patient to Emergency Department

## 2017-02-22 NOTE — Progress Notes (Signed)
Patient Care Team: [name], DO as PCP - General (Family Medicine)   HPI  Morgan Clay is a 71 y.o. female Seen in followup for CRT-D implanted for nonischemic cardiomyopathy. This is undertaken 2008 with generator change 2012 and again in May 2017   She has significant improvement from her CRT-D    She has a history of atypical atrial flutter that has been associated with functional deterioration. She underwent cardioversion  8/16 and has been treated with apixaban.      The patient he complains of feeling terrible. She is short. She has abdominal swelling. She is noteo 10 pound weight gain over the last few weeks. She has some peripheral edema.   5/17 K3.7 Cr 1.30   DATE TEST    **4/*/15    Echo   EF 55-60 %   9/17    Echo   EF 45-50 % LAE (52/3.1/35)          Past Medical History:  Diagnosis Date  . AICD (automatic cardioverter/defibrillator) present   . Arthritis   . Asthma    reports mild asthma since childhood - had COPD on dx list from prior PCP  . Atypical atrial flutter ([place]) 09/28/2015  . Biventricular implantable cardiac defibrillator in situ 12/24/2006   Protecta- Medtronic  ICD gen change 2012  . Chronic systolic congestive heart failure ([place])   . DEPRESSION 12/17/2009   Annotation: PHQ-9 score = 14 done on 12/17/2009 Qualifier: Diagnosis of  By: Hassell Done FNP, Tori Milks    . FIBROIDS, UTERUS 03/05/2008   Qualifier: Diagnosis of  By: Isla Pence    . Glaucoma   . Hepatitis C 01/04/2007   Qualifier: Diagnosis of  By: Hassell Done FNP, Tori Milks    . HEPATITIS C - s/p treatment with Harvoni, seeing hepatology, Dawn Drazek  01/04/2007   Qualifier: Diagnosis of  By: Hassell Done FNP, Tori Milks    . Hx of cardiovascular stress test    Lexiscan Myoview (12/15): No ischemia, EF 33%, high risk due to depressed LV function   . Hx of echocardiogram    Echo (12/15): Severe LVH, EF 25-30%, diffuse HK, restrictive physiology, mild AI, mild MR, moderate LAE, mild RVE,  mild RAE, moderate to severe TR, mild increased PASP  . Hyperlipemia 12/06/2012  . HYPERTENSION, BENIGN 04/24/2007   Qualifier: Diagnosis of  By: Hassell Done FNP, Tori Milks    . Hypothyroidism   . LBBB (left bundle branch block)   . Nonischemic cardiomyopathy ([place])    Followed by [facility]  . OBESITY 05/27/2009   Qualifier: Diagnosis of  By: Hassell Done FNP, Tori Milks    . OBSTRUCTIVE SLEEP APNEA 11/14/2007   npsg 2009:  Mild osa with AHI 11/hr. Started cpap 2009, quit using due to lack of response Home sleep testing 09/2010 with AHI only 7/hr and weight loss advised by pulmonology.  . OSTEOPENIA 09/30/2008   Qualifier: Diagnosis of  By: Hassell Done FNP, Tori Milks    . Personal history of other infectious and parasitic disease    Hepatitis B    Past Surgical History:  Procedure Laterality Date  . appendectomy    . CARDIAC CATHETERIZATION    . CARDIOVERSION N/A 12/14/2014   Procedure: CARDIOVERSION;  Surgeon: Dorothy Spark, MD;  Location: [facility] ENDOSCOPY;  Service: Cardiovascular;  Laterality: N/A;  . EP IMPLANTABLE DEVICE N/A 10/06/2015   Procedure: BIV ICD Generator Changeout;  Surgeon: [name], MD;  Location: [place] CV LAB;  Service: Cardiovascular;  Laterality: N/A;  .  INSERT / REPLACE / REMOVE PACEMAKER     concerto  . I33.82     Concerto [id number]    Current Outpatient Prescriptions  Medication Sig Dispense Refill  . albuterol (PROVENTIL HFA;VENTOLIN HFA) 108 (90 BASE) MCG/ACT inhaler Inhale 2 puffs into the lungs every 6 (six) hours as needed for wheezing or shortness of breath. 1 Inhaler 3  . apixaban (ELIQUIS) 5 MG TABS tablet Take 1 tablet (5 mg total) by mouth 2 (two) times daily. 180 tablet 3  . BIDIL 20-37.5 MG tablet TAKE ONE TABLET BY MOUTH TWICE DAILY 60 tablet 0  . carvedilol (COREG) 25 MG tablet Take 1 tablet (25 mg total) by mouth 2 (two) times daily with a meal. 180 tablet 3  . ENTRESTO 97-103 MG TAKE ONE TABLET BY MOUTH TWICE DAILY **TITRATED  DOSE  UP** 60 tablet 0  .  fluticasone (FLONASE) 50 MCG/ACT nasal spray Place 2 sprays into both nostrils daily. 16 g 6  . furosemide (LASIX) 40 MG tablet Take 1 tablet (40 mg total) by mouth 2 (two) times daily.  May take additional 40 mg tablet as needed for fluid symptoms. 180 tablet 3  . levothyroxine (SYNTHROID, LEVOTHROID) 137 MCG tablet TAKE 1 TABLET BY MOUTH ONCE DAILY BEFORE BREAKFAST 90 tablet 1  . nitroGLYCERIN (NITROSTAT) 0.4 MG SL tablet Place 0.4 mg under the tongue every 5 (five) minutes as needed for chest pain.    [name] Phenylephrine-DM-GG (TUSSIN CF PO) Take by mouth as directed.    . sertraline (ZOLOFT) 100 MG tablet Take 1 tablet (100 mg total) by mouth daily. 90 tablet 1  . traMADol (ULTRAM) 50 MG tablet 1/2 to 1 tablet as needed for severe pain not more then twice daily. 20 tablet 0  . traZODone (DESYREL) 50 MG tablet Take 1 tablet (50 mg total) by mouth at bedtime. 90 tablet 3   No current facility-administered medications for this visit.     No Known Allergies  Review of Systems negative except from HPI and PMH  Physical Exam BP (!) 192/64   Pulse 69   Ht 4' 11.5" (1.511 m)   Wt 155 lb 6.4 oz (70.5 kg)   SpO2 97%   BMI 30.86 kg/m  Well developed and well nourished in no acute distress HENT normal E scleral and icterus clear Neck Supple JVP >10  cm carotids brisk and full Device pocket well healed; without hematoma or erythema.  There is no tethering Clear to ausculation regular rate and rhythm, no murmurs gallops or rub Soft with active bowel sounds No clubbing cyanosis tr  Edema Alert and oriented, grossly normal motor and sensory function Skin Warm and Dry  ECG afluter atypiecal   vPacing at 60   Assessment and  Plan  DiastolicSystolic heart failure Acute/ chronic  Nonischemic cardiomyopathy  Hypertension Urgency  CRT-D- Medtronic  The patient's device was interrogated.  The information was reviewed. No changes were made in the programming.    Atrial Flutter-atypical    She is quite short of breath and volume overloaded  She is severely hypertensive. This may be contributing. The cough raises the posught she had sinusitis. I wonder whether is not all heart We have discussed going to the emergency room for triag,  to rule out infection. If nothing is found then to consider cardioversion  IV diuresis and possible close outpt followup   Her intrinsic QRS is about 1:30 milliseconds. But there was significant improvement with CRT so we will leave her pacing.  We have given her clonidine 0.1 mg for BP 190 repeated

## 2017-02-22 NOTE — ED Triage Notes (Signed)
Pt had scheduled appt at Dr. Danie Chandler office today and was sent here from there due to fluid overload and A flutter. Pt complains of SOB and cough. Endorses mild chest pain with cough. Per dr Jens Som would like to rule out infection and cardiovert and diurese. Pt mildly tachypnic. Skin warm and dry. Rhonchi bilaterally.

## 2017-02-22 NOTE — Consult Note (Signed)
CARDIOLOGY CONSULT NOTE   Referring Physician: Dr. Dorie Rank Primary Physician: Dr. Colin Benton Primary Cardiologist: Dr. Virl Axe Reason for Consultation: shortness of breath   HPI: Shauniece Clay is a 71 y.o. female with a history of nonischemic CM, HFrEF (45%), s/p Bi-ventricular pacer/ICD, obesity, OSA, AFL, and CKD p/w SOB.  Patient reports approximately 10 pounds of weight gain over the past 2 weeks. With this she has become progressively more short of breath with exertion. She reports bilateral lower extremity edema, abdominal fullness as well. She denies having missed any doses of her home lasix and has not made any recent changes to her diet. She was seen by her electrophysiologist earlier today and thought to be volume overloaded, thus recommended that she come to the ED for evaluation. She denies orthopnea, PND, chest pain, syncope, palpitations.   Review of Systems:     Cardiac Review of Systems: {Y] = yes [ ]  = no  Chest Pain [    ]  Resting SOB [ Y  ] Exertional SOB  [ Y ]  Orthopnea [  ]   Pedal Edema [ Y  ]    Palpitations [  ] Syncope  [  ]   Presyncope [   ]  General Review of Systems: [Y] = yes [  ]=no Constitional: recent weight change [  ]; anorexia [  ]; fatigue [  ]; nausea [  ]; night sweats [  ]; fever [  ]; or chills [  ];                                                                     Eyes : blurred vision [  ]; diplopia [   ]; vision changes [  ];  Amaurosis fugax[  ]; Resp: cough [  ];  wheezing[  ];  hemoptysis[  ];  PND [  ];  GI:  gallstones[  ], vomiting[  ];  dysphagia[  ]; melena[  ];  hematochezia [  ]; heartburn[  ];   GU: kidney stones [  ]; hematuria[  ];   dysuria [  ];  nocturia[  ]; incontinence [  ];             Skin: rash, swelling[  ];, hair loss[  ];  peripheral edema[  ];  or itching[  ]; Musculosketetal: myalgias[  ];  joint swelling[  ];  joint erythema[  ];  joint pain[  ];  back pain[  ];  Heme/Lymph: bruising[  ];  bleeding[   ];  anemia[  ];  Neuro: TIA[  ];  headaches[  ];  stroke[  ];  vertigo[  ];  seizures[  ];   paresthesias[  ];  difficulty walking[  ];  Psych:depression[  ]; anxiety[  ];  Endocrine: diabetes[  ];  thyroid dysfunction[  ];  Other:  Past Medical History:  Diagnosis Date  . AICD (automatic cardioverter/defibrillator) present   . Arthritis   . Asthma    reports mild asthma since childhood - had COPD on dx list from prior PCP  . Atypical atrial flutter ([place]) 09/28/2015  . Biventricular implantable cardiac defibrillator in situ 12/24/2006   Protecta- Medtronic  ICD gen change 2012  .  Chronic systolic congestive heart failure ([place])   . DEPRESSION 12/17/2009   Annotation: PHQ-9 score = 14 done on 12/17/2009 Qualifier: Diagnosis of  By: Hassell Done FNP, Tori Milks    . FIBROIDS, UTERUS 03/05/2008   Qualifier: Diagnosis of  By: Isla Pence    . Glaucoma   . Hepatitis C 01/04/2007   Qualifier: Diagnosis of  By: Hassell Done FNP, Tori Milks    . HEPATITIS C - s/p treatment with Harvoni, seeing hepatology, Dawn Drazek  01/04/2007   Qualifier: Diagnosis of  By: Hassell Done FNP, Tori Milks    . Hx of cardiovascular stress test    Lexiscan Myoview (12/15): No ischemia, EF 33%, high risk due to depressed LV function   . Hx of echocardiogram    Echo (12/15): Severe LVH, EF 25-30%, diffuse HK, restrictive physiology, mild AI, mild MR, moderate LAE, mild RVE, mild RAE, moderate to severe TR, mild increased PASP  . Hyperlipemia 12/06/2012  . HYPERTENSION, BENIGN 04/24/2007   Qualifier: Diagnosis of  By: Hassell Done FNP, Tori Milks    . Hypothyroidism   . LBBB (left bundle branch block)   . Nonischemic cardiomyopathy ([place])    Followed by [facility]  . OBESITY 05/27/2009   Qualifier: Diagnosis of  By: Hassell Done FNP, Tori Milks    . OBSTRUCTIVE SLEEP APNEA 11/14/2007   npsg 2009:  Mild osa with AHI 11/hr. Started cpap 2009, quit using due to lack of response Home sleep testing 09/2010 with AHI only 7/hr and weight loss advised by  pulmonology.  . OSTEOPENIA 09/30/2008   Qualifier: Diagnosis of  By: Hassell Done FNP, Tori Milks    . Personal history of other infectious and parasitic disease    Hepatitis B    Prior to Admission medications   Medication Sig Start Date End Date Taking? Authorizing Provider  albuterol (PROVENTIL HFA;VENTOLIN HFA) 108 (90 BASE) MCG/ACT inhaler Inhale 2 puffs into the lungs every 6 (six) hours as needed for wheezing or shortness of breath. 01/01/15  Yes [name], DO  apixaban (ELIQUIS) 5 MG TABS tablet Take 1 tablet (5 mg total) by mouth 2 (two) times daily. 04/20/16  Yes [name], MD  BIDIL 20-37.5 MG tablet TAKE ONE TABLET BY MOUTH TWICE DAILY 02/12/17  Yes [name], MD  carvedilol (COREG) 25 MG tablet Take 1 tablet (25 mg total) by mouth 2 (two) times daily with a meal. 05/21/13  Yes Wellington Hampshire, MD  ENTRESTO 97-103 MG TAKE ONE TABLET BY MOUTH TWICE DAILY **TITRATED  DOSE  UP** 02/12/17  Yes [name], MD  fluticasone [facility]) 50 MCG/ACT nasal spray Place 2 sprays into both nostrils daily. 01/24/17  Yes Nafziger, Tommi Rumps, NP  furosemide (LASIX) 40 MG tablet Take 1 tablet (40 mg total) by mouth 2 (two) times daily.  May take additional 40 mg tablet as needed for fluid symptoms. 04/13/16  Yes [name], MD  levothyroxine (SYNTHROID, LEVOTHROID) 137 MCG tablet TAKE 1 TABLET BY MOUTH ONCE DAILY BEFORE BREAKFAST 02/09/17  Yes [name], DO  nitroGLYCERIN (NITROSTAT) 0.4 MG SL tablet Place 0.4 mg under the tongue every 5 (five) minutes as needed for chest pain.   Yes [provider]  sertraline (ZOLOFT) 100 MG tablet Take 1 tablet (100 mg total) by mouth daily. 06/30/14  Yes [name], DO  traMADol (ULTRAM) 50 MG tablet 1/2 to 1 tablet as needed for severe pain not more then twice daily. Patient taking differently: Take 25-50 mg by mouth every 12 (twelve) hours as  needed for moderate pain.  08/30/15  Yes [name], DO  traZODone (DESYREL) 50 MG tablet Take 1  tablet (50 mg total) by mouth at bedtime. 06/30/15  Yes Colin Benton R, DO      . nitroGLYCERIN  1 inch Topical Q6H    No Known Allergies  Social History   Social History  . Marital status: Single    Spouse name: N/A  . Number of children: N/A  . Years of education: N/A   Occupational History  . Not on file.   Social History Main Topics  . Smoking status: Never Smoker  . Smokeless tobacco: Never Used  . Alcohol use Yes     Comment: 3 glasses of wine 3-4 times per week  . Drug use: No  . Sexual activity: Not on file   Other Topics Concern  . Not on file   Social History Narrative   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      Home Situation: lives alone      Spiritual Beliefs: [place]      Lifestyle: no regular exercise; poor diet             Family History  Problem Relation Age of Onset  . Asthma Father   . Heart attack Father   . Asthma Sister   . Cancer Sister        lung  . Heart attack Mother   . Stroke Brother   . Diabetes Unknown     PHYSICAL EXAM: Vitals:   02/22/17 2200 02/22/17 2315  BP: (!) 162/65 (!) 184/81  Pulse: (!) 53 60  Resp: 16 (!) 21  Temp:    SpO2: 99% 96%    No intake or output data in the 24 hours ending 02/22/17 2333  General:  Well appearing. No respiratory difficulty HEENT: normal Neck: supple. JVP elevated to > 14 cm H2O Carotids 2+ bilat; no bruits. No lymphadenopathy or thryomegaly appreciated. Cor: PMI nondisplaced. Regular rate & rhythm. No rubs, gallops. 2/6 systolic murmur at the LLSB.  Lungs: crackles and wheezing throughout bilateral lung fields Abdomen: soft, nontender, nondistended. No hepatosplenomegaly. No bruits or masses. Good bowel sounds. Extremities: no cyanosis, clubbing, rash. Minimal non-pitting edema Neuro: alert & oriented x 3, cranial nerves grossly intact. moves all 4 extremities w/o difficulty. Affect pleasant.  ECG: atypical atrial flutter w/ atrial cycle length approximately  225ms, ventricular rate 75bpm, occasional natively conducted beats  Results for orders placed or performed during the hospital encounter of 02/22/17 (from the past 24 hour(s))  Basic metabolic panel     Status: Abnormal   Collection Time: 02/22/17  6:17 PM  Result Value Ref Range   Sodium 136 135 - 145 mmol/L   Potassium 3.6 3.5 - 5.1 mmol/L   Chloride 101 101 - 111 mmol/L   CO2 23 22 - 32 mmol/L   Glucose, Bld 125 (H) 65 - 99 mg/dL   BUN 14 6 - 20 mg/dL   Creatinine, Ser 1.37 (H) 0.44 - 1.00 mg/dL   Calcium 9.3 8.9 - 10.3 mg/dL   GFR calc non Af Amer 38 (L) >60 mL/min   GFR calc Af Amer 44 (L) >60 mL/min   Anion gap 12 5 - 15  CBC     Status: None   Collection Time: 02/22/17  6:17 PM  Result Value Ref Range   WBC 6.2 4.0 - 10.5 K/uL   RBC 4.36 3.87 - 5.11 MIL/uL   Hemoglobin  12.5 12.0 - 15.0 g/dL   HCT 38.3 36.0 - 46.0 %   MCV 87.8 78.0 - 100.0 fL   MCH 28.7 26.0 - 34.0 pg   MCHC 32.6 30.0 - 36.0 g/dL   RDW 15.2 11.5 - 15.5 %   Platelets 181 150 - 400 K/uL  Troponin I     Status: Abnormal   Collection Time: 02/22/17  6:17 PM  Result Value Ref Range   Troponin I 0.04 (HH) <0.03 ng/mL  I-stat troponin, ED     Status: None   Collection Time: 02/22/17  6:45 PM  Result Value Ref Range   Troponin i, poc 0.03 0.00 - 0.08 ng/mL   Comment 3          Brain natriuretic peptide     Status: Abnormal   Collection Time: 02/22/17  9:26 PM  Result Value Ref Range   B Natriuretic Peptide 1,275.8 (H) 0.0 - 100.0 pg/mL   Dg Chest 2 View  Result Date: 02/22/2017 CLINICAL DATA:  Fluid overload. EXAM: CHEST  2 VIEW COMPARISON:  Chest x-ray dated Oct 01, 2015. FINDINGS: Stable cardiomegaly. Unchanged left chest wall AICD. Lower lobe predominant interstitial markings. No focal consolidation, pleural effusion, or pneumothorax. No acute osseous abnormality. IMPRESSION: Cardiomegaly and mild pulmonary vascular congestion, unchanged. Electronically Signed   By: Titus Dubin M.D.   On: 02/22/2017  18:55   ASSESSMENT: Morgan Clay is a 71 y.o. female with a history of nonischemic CM, HFrEF (45%), s/p Bi-ventricular pacer/ICD, obesity, OSA, AFL, and CKD p/w SOB. The patient is clearly in acute decompensated heart failure though has no signs or symptoms consistent with cardiogenic shock. Possible etiologies for this include ineffective biventricular pacing, loss of AV synchrony, uncontrolled hypertension, dietary indiscretion, medication non-adherence. I believe the patient would benefit symptomatically from IV diuresis as well as restoration of sinus rhythm once she is close to euvolemia.   PLAN/DISCUSSION: - check TSH, FT4 - pacemaker interrogation to determine effective bi-ventricular pacing percentage - give IV lasix 80mg  boluses to achieve net fluid balance negative 2L in 24 hours - continue home apixaban, will need to confirm that she has not missed any doses within the prior 3 to 4 weeks  - recommend electrical cardioversion once patient is close to euvolemia - may consider discussion with EP regarding atypical flutter ablation vs antiarrhythmic drug therapy  Marcie Mowers, MD Cardiology Fellow, PGY-5

## 2017-02-22 NOTE — ED Provider Notes (Addendum)
[facility] EMERGENCY DEPARTMENT Provider Note   CSN: [id number] Arrival date & time: 02/22/17  1743     History   Chief Complaint Chief Complaint  Patient presents with  . Chest Pain    HPI Morgan Clay is a 71 y.o. female.  HPI Pt stared having sharp chest pain a few days ago.  Pt also started having trouble with cough and congestion.  THe pain has been steady.  She has been feeling more short of breath with activity and exertion.  She feels like she is retaining fluid.  She went to her cardiologist today.  They sent her to the ED for probable diuresis and possible cardioversion.  Pt denies fevers or chills but she does feel like she has sinus congestion Past Medical History:  Diagnosis Date  . AICD (automatic cardioverter/defibrillator) present   . Arthritis   . Asthma    reports mild asthma since childhood - had COPD on dx list from prior PCP  . Atypical atrial flutter ([place]) 09/28/2015  . Biventricular implantable cardiac defibrillator in situ 12/24/2006   Protecta- [employer]  ICD gen change 2012  . Chronic systolic congestive heart failure ([place])   . DEPRESSION 12/17/2009   Annotation: PHQ-9 score = 14 done on 12/17/2009 Qualifier: Diagnosis of  By: Hassell Done FNP, Tori Milks    . FIBROIDS, UTERUS 03/05/2008   Qualifier: Diagnosis of  By: Isla Pence    . Glaucoma   . Hepatitis C 01/04/2007   Qualifier: Diagnosis of  By: Hassell Done FNP, Tori Milks    . HEPATITIS C - s/p treatment with Harvoni, seeing hepatology, Dawn Drazek  01/04/2007   Qualifier: Diagnosis of  By: Hassell Done FNP, Tori Milks    . Hx of cardiovascular stress test    Lexiscan Myoview (12/15): No ischemia, EF 33%, high risk due to depressed LV function   . Hx of echocardiogram    Echo (12/15): Severe LVH, EF 25-30%, diffuse HK, restrictive physiology, mild AI, mild MR, moderate LAE, mild RVE, mild RAE, moderate to severe TR, mild increased PASP  . Hyperlipemia 12/06/2012  . HYPERTENSION, BENIGN  04/24/2007   Qualifier: Diagnosis of  By: Hassell Done FNP, Tori Milks    . Hypothyroidism   . LBBB (left bundle branch block)   . Nonischemic cardiomyopathy ([place])    Followed by [facility]  . OBESITY 05/27/2009   Qualifier: Diagnosis of  By: Hassell Done FNP, Tori Milks    . OBSTRUCTIVE SLEEP APNEA 11/14/2007   npsg 2009:  Mild osa with AHI 11/hr. Started cpap 2009, quit using due to lack of response Home sleep testing 09/2010 with AHI only 7/hr and weight loss advised by pulmonology.  . OSTEOPENIA 09/30/2008   Qualifier: Diagnosis of  By: Hassell Done FNP, Tori Milks    . Personal history of other infectious and parasitic disease    Hepatitis B    Patient Active Problem List   Diagnosis Date Noted  . Atypical atrial flutter ([place]) 09/28/2015  . Resistant hypertension 08/30/2015  . Type 2 diabetes, controlled, with renal manifestation ([place]) 11/24/2013  . Chronic kidney disease - followed by [facility] 11/24/2013  . Nonischemic cardiomyopathy ([facility]) 11/22/2010  . Obesity 05/27/2009  . OSTEOPENIA 09/30/2008  . OBSTRUCTIVE SLEEP APNEA 11/14/2007  . Chronic systolic heart failure ([place]) 04/24/2007  . Hypothyroidism 01/28/2007  . Biventricular ICD (implantable cardioverter-defibrillator) in place 01/08/2007    Past Surgical History:  Procedure Laterality Date  . appendectomy    . CARDIAC CATHETERIZATION    . CARDIOVERSION N/A 12/14/2014  Procedure: CARDIOVERSION;  Surgeon: Dorothy Spark, MD;  Location: [facility] ENDOSCOPY;  Service: Cardiovascular;  Laterality: N/A;  . EP IMPLANTABLE DEVICE N/A 10/06/2015   Procedure: BIV ICD Generator Changeout;  Surgeon: [name], MD;  Location: [facility] CV LAB;  Service: Cardiovascular;  Laterality: N/A;  . INSERT / REPLACE / REMOVE PACEMAKER     concerto  . E08.14     Concerto [id number]    OB History    No data available       Home Medications    Prior to Admission medications   Medication Sig Start Date End Date Taking? Authorizing Provider    albuterol (PROVENTIL HFA;VENTOLIN HFA) 108 (90 BASE) MCG/ACT inhaler Inhale 2 puffs into the lungs every 6 (six) hours as needed for wheezing or shortness of breath. 01/01/15  Yes [name], DO  apixaban (ELIQUIS) 5 MG TABS tablet Take 1 tablet (5 mg total) by mouth 2 (two) times daily. 04/20/16  Yes [name], MD  BIDIL 20-37.5 MG tablet TAKE ONE TABLET BY MOUTH TWICE DAILY 02/12/17  Yes [name], MD  carvedilol (COREG) 25 MG tablet Take 1 tablet (25 mg total) by mouth 2 (two) times daily with a meal. 05/21/13  Yes Wellington Hampshire, MD  ENTRESTO 97-103 MG TAKE ONE TABLET BY MOUTH TWICE DAILY **TITRATED  DOSE  UP** 02/12/17  Yes [name], MD  fluticasone [facility]) 50 MCG/ACT nasal spray Place 2 sprays into both nostrils daily. 01/24/17  Yes Nafziger, Tommi Rumps, NP  furosemide (LASIX) 40 MG tablet Take 1 tablet (40 mg total) by mouth 2 (two) times daily.  May take additional 40 mg tablet as needed for fluid symptoms. 04/13/16  Yes [name], MD  levothyroxine (SYNTHROID, LEVOTHROID) 137 MCG tablet TAKE 1 TABLET BY MOUTH ONCE DAILY BEFORE BREAKFAST 02/09/17  Yes [name], DO  nitroGLYCERIN (NITROSTAT) 0.4 MG SL tablet Place 0.4 mg under the tongue every 5 (five) minutes as needed for chest pain.   Yes [provider]  sertraline (ZOLOFT) 100 MG tablet Take 1 tablet (100 mg total) by mouth daily. 06/30/14  Yes [name], DO  traMADol (ULTRAM) 50 MG tablet 1/2 to 1 tablet as needed for severe pain not more then twice daily. Patient taking differently: Take 25-50 mg by mouth every 12 (twelve) hours as needed for moderate pain.  08/30/15  Yes [name], DO  traZODone (DESYREL) 50 MG tablet Take 1 tablet (50 mg total) by mouth at bedtime. 06/30/15  Yes [name], DO    Family History Family History  Problem Relation Age of Onset  . Asthma Father   . Heart attack Father   . Asthma Sister   . Cancer Sister        lung  . Heart attack Mother   . Stroke  Brother   . Diabetes Unknown     Social History Social History  Substance Use Topics  . Smoking status: Never Smoker  . Smokeless tobacco: Never Used  . Alcohol use Yes     Comment: 3 glasses of wine 3-4 times per week     Allergies   Patient has no known allergies.   Review of Systems Review of Systems  All other systems reviewed and are negative.    Physical Exam Updated Vital Signs BP (!) 162/65   Pulse (!) 53   Temp 98.1 F (36.7 C) (Oral)   Resp 16   Ht 1.511 m (  4' 11.5")   Wt 70.1 kg (154 lb 9.6 oz)   SpO2 99%   BMI 30.70 kg/m   Physical Exam  Constitutional: She appears well-developed and well-nourished. No distress.  HENT:  Head: Normocephalic and atraumatic.  Right Ear: External ear normal.  Left Ear: External ear normal.  Eyes: Conjunctivae are normal. Right eye exhibits no discharge. Left eye exhibits no discharge. No scleral icterus.  Neck: Neck supple. No tracheal deviation present.  Cardiovascular: Normal rate, regular rhythm and intact distal pulses.   Pulmonary/Chest: Effort normal. No stridor. No respiratory distress. She has rales.  Abdominal: Soft. Bowel sounds are normal. She exhibits no distension. There is no tenderness. There is no rebound and no guarding.  Musculoskeletal: She exhibits no edema or tenderness.  Neurological: She is alert. She has normal strength. No cranial nerve deficit (no facial droop, extraocular movements intact, no slurred speech) or sensory deficit. She exhibits normal muscle tone. She displays no seizure activity. Coordination normal.  Skin: Skin is warm and dry. No rash noted.  Psychiatric: She has a normal mood and affect.  Nursing note and vitals reviewed.    ED Treatments / Results  Labs (all labs ordered are listed, but only abnormal results are displayed) Labs Reviewed  BASIC METABOLIC PANEL - Abnormal; Notable for the following:       Result Value   Glucose, Bld 125 (*)    Creatinine, Ser 1.37 (*)     GFR calc non Af Amer 38 (*)    GFR calc Af Amer 44 (*)    All other components within normal limits  TROPONIN I - Abnormal; Notable for the following:    Troponin I 0.04 (*)    All other components within normal limits  BRAIN NATRIURETIC PEPTIDE - Abnormal; Notable for the following:    B Natriuretic Peptide 1,275.8 (*)    All other components within normal limits  CBC  I-STAT TROPONIN, ED    EKG  EKG Interpretation  Date/Time:  Thursday February 22 2017 17:50:36 EDT Ventricular Rate:  75 PR Interval:    QRS Duration: 144 QT Interval:  458 QTC Calculation: 511 R Axis:   -91 Text Interpretation:  Ventricular-paced rhythm Biventricular pacemaker detected Abnormal ECG Confirmed by Dorie Rank [phone]) on 02/22/2017 9:21:03 PM       Radiology Dg Chest 2 View  Result Date: 02/22/2017 CLINICAL DATA:  Fluid overload. EXAM: CHEST  2 VIEW COMPARISON:  Chest x-ray dated Oct 01, 2015. FINDINGS: Stable cardiomegaly. Unchanged left chest wall AICD. Lower lobe predominant interstitial markings. No focal consolidation, pleural effusion, or pneumothorax. No acute osseous abnormality. IMPRESSION: Cardiomegaly and mild pulmonary vascular congestion, unchanged. Electronically Signed   By: Titus Dubin M.D.   On: 02/22/2017 18:55    Procedures Procedures (including critical care time)  Medications Ordered in ED Medications  nitroGLYCERIN (NITROGLYN) 2 % ointment 1 inch (not administered)  benzonatate (TESSALON) capsule 100 mg (not administered)  furosemide (LASIX) injection 40 mg (40 mg Intravenous Given 02/22/17 2304)     Initial Impression / Assessment and Plan / ED Course  I have reviewed the triage vital signs and the nursing notes.  Pertinent labs & imaging results that were available during my care of the patient were reviewed by me and considered in my medical decision making (see chart for details).  Clinical Course as of Feb 23 2328  Thu Feb 22, 2017  2329 D/w Cardiology  on call.  Will see patient in consultation  [JK]  Clinical Course User Index [JK] Dorie Rank, MD  Pt presented to the ED with SOB, CP, cough.  Seems to be a combination of URI and CHF exacerbation.  Elevated BNP and hypertensive.  IV lasix and NTG ordered.  No rapid atrial flutter noted on her EKG.   No indication for emergent cardioversion at this time but may benefit as indicated by cardiology.  Will consult with cardiology, and medicine.  Probable admission for CHF exacerbation.  Final Clinical Impressions(s) / ED Diagnoses   Final diagnoses:  Acute on chronic congestive heart failure, unspecified heart failure type [facility])  Essential hypertension        Dorie Rank, MD 02/22/17 2329

## 2017-02-23 ENCOUNTER — Encounter: Payer: Medicare Other | Admitting: Internal Medicine

## 2017-02-23 ENCOUNTER — Other Ambulatory Visit: Payer: Self-pay

## 2017-02-23 ENCOUNTER — Telehealth: Payer: Self-pay | Admitting: Internal Medicine

## 2017-02-23 ENCOUNTER — Encounter (HOSPITAL_COMMUNITY): Payer: Self-pay | Admitting: Internal Medicine

## 2017-02-23 DIAGNOSIS — J449 Chronic obstructive pulmonary disease, unspecified: Secondary | ICD-10-CM | POA: Diagnosis not present

## 2017-02-23 DIAGNOSIS — E039 Hypothyroidism, unspecified: Secondary | ICD-10-CM | POA: Diagnosis present

## 2017-02-23 DIAGNOSIS — I130 Hypertensive heart and chronic kidney disease with heart failure and stage 1 through stage 4 chronic kidney disease, or unspecified chronic kidney disease: Secondary | ICD-10-CM | POA: Diagnosis not present

## 2017-02-23 DIAGNOSIS — Z7901 Long term (current) use of anticoagulants: Secondary | ICD-10-CM | POA: Diagnosis not present

## 2017-02-23 DIAGNOSIS — I10 Essential (primary) hypertension: Secondary | ICD-10-CM | POA: Diagnosis not present

## 2017-02-23 DIAGNOSIS — Z95810 Presence of automatic (implantable) cardiac defibrillator: Secondary | ICD-10-CM

## 2017-02-23 DIAGNOSIS — Z825 Family history of asthma and other chronic lower respiratory diseases: Secondary | ICD-10-CM | POA: Diagnosis not present

## 2017-02-23 DIAGNOSIS — Z79890 Hormone replacement therapy (postmenopausal): Secondary | ICD-10-CM | POA: Diagnosis not present

## 2017-02-23 DIAGNOSIS — R0602 Shortness of breath: Secondary | ICD-10-CM | POA: Diagnosis not present

## 2017-02-23 DIAGNOSIS — I361 Nonrheumatic tricuspid (valve) insufficiency: Secondary | ICD-10-CM | POA: Diagnosis not present

## 2017-02-23 DIAGNOSIS — I509 Heart failure, unspecified: Secondary | ICD-10-CM | POA: Diagnosis not present

## 2017-02-23 DIAGNOSIS — I429 Cardiomyopathy, unspecified: Secondary | ICD-10-CM | POA: Diagnosis present

## 2017-02-23 DIAGNOSIS — I484 Atypical atrial flutter: Secondary | ICD-10-CM | POA: Diagnosis not present

## 2017-02-23 DIAGNOSIS — I428 Other cardiomyopathies: Secondary | ICD-10-CM | POA: Diagnosis not present

## 2017-02-23 DIAGNOSIS — I340 Nonrheumatic mitral (valve) insufficiency: Secondary | ICD-10-CM | POA: Diagnosis not present

## 2017-02-23 DIAGNOSIS — E038 Other specified hypothyroidism: Secondary | ICD-10-CM | POA: Diagnosis not present

## 2017-02-23 DIAGNOSIS — F419 Anxiety disorder, unspecified: Secondary | ICD-10-CM | POA: Diagnosis present

## 2017-02-23 DIAGNOSIS — E871 Hypo-osmolality and hyponatremia: Secondary | ICD-10-CM | POA: Diagnosis not present

## 2017-02-23 DIAGNOSIS — R062 Wheezing: Secondary | ICD-10-CM | POA: Diagnosis not present

## 2017-02-23 DIAGNOSIS — E876 Hypokalemia: Secondary | ICD-10-CM | POA: Diagnosis present

## 2017-02-23 DIAGNOSIS — E785 Hyperlipidemia, unspecified: Secondary | ICD-10-CM | POA: Diagnosis present

## 2017-02-23 DIAGNOSIS — I5043 Acute on chronic combined systolic (congestive) and diastolic (congestive) heart failure: Secondary | ICD-10-CM | POA: Diagnosis not present

## 2017-02-23 DIAGNOSIS — Z7951 Long term (current) use of inhaled steroids: Secondary | ICD-10-CM | POA: Diagnosis not present

## 2017-02-23 DIAGNOSIS — Z8249 Family history of ischemic heart disease and other diseases of the circulatory system: Secondary | ICD-10-CM | POA: Diagnosis not present

## 2017-02-23 DIAGNOSIS — H409 Unspecified glaucoma: Secondary | ICD-10-CM | POA: Diagnosis present

## 2017-02-23 DIAGNOSIS — N179 Acute kidney failure, unspecified: Secondary | ICD-10-CM | POA: Diagnosis not present

## 2017-02-23 DIAGNOSIS — F329 Major depressive disorder, single episode, unspecified: Secondary | ICD-10-CM | POA: Diagnosis present

## 2017-02-23 DIAGNOSIS — Z79899 Other long term (current) drug therapy: Secondary | ICD-10-CM | POA: Diagnosis not present

## 2017-02-23 DIAGNOSIS — N183 Chronic kidney disease, stage 3 (moderate): Secondary | ICD-10-CM | POA: Diagnosis present

## 2017-02-23 DIAGNOSIS — I5022 Chronic systolic (congestive) heart failure: Secondary | ICD-10-CM | POA: Diagnosis not present

## 2017-02-23 DIAGNOSIS — I160 Hypertensive urgency: Secondary | ICD-10-CM | POA: Diagnosis present

## 2017-02-23 DIAGNOSIS — I5023 Acute on chronic systolic (congestive) heart failure: Secondary | ICD-10-CM | POA: Diagnosis not present

## 2017-02-23 DIAGNOSIS — R05 Cough: Secondary | ICD-10-CM | POA: Diagnosis not present

## 2017-02-23 DIAGNOSIS — G4733 Obstructive sleep apnea (adult) (pediatric): Secondary | ICD-10-CM | POA: Diagnosis present

## 2017-02-23 DIAGNOSIS — E1122 Type 2 diabetes mellitus with diabetic chronic kidney disease: Secondary | ICD-10-CM | POA: Diagnosis present

## 2017-02-23 LAB — BASIC METABOLIC PANEL
ANION GAP: 11 (ref 5–15)
BUN: 17 mg/dL (ref 6–20)
CHLORIDE: 105 mmol/L (ref 101–111)
CO2: 23 mmol/L (ref 22–32)
CREATININE: 1.45 mg/dL — AB (ref 0.44–1.00)
Calcium: 8.8 mg/dL — ABNORMAL LOW (ref 8.9–10.3)
GFR calc non Af Amer: 36 mL/min — ABNORMAL LOW (ref >60)
GFR, EST AFRICAN AMERICAN: 42 mL/min — AB (ref >60)
Glucose, Bld: 108 mg/dL — ABNORMAL HIGH (ref 65–99)
POTASSIUM: 3.2 mmol/L — AB (ref 3.5–5.1)
SODIUM: 139 mmol/L (ref 135–145)

## 2017-02-23 LAB — CUP PACEART INCLINIC DEVICE CHECK
Battery Voltage: 2.98 V
Brady Statistic AP VP Percent: 0.19 %
Brady Statistic RA Percent Paced: 0.2 %
Brady Statistic RV Percent Paced: 89.68 %
Date Time Interrogation Session: 20181018204029
HighPow Impedance: 39 Ohm
HighPow Impedance: 70 Ohm
Implantable Lead Implant Date: 20080818
Implantable Lead Implant Date: 20080818
Implantable Lead Location: 753859
Implantable Lead Model: 4193
Implantable Lead Model: 6947
Lead Channel Impedance Value: 4047 Ohm
Lead Channel Impedance Value: 437 Ohm
Lead Channel Pacing Threshold Pulse Width: 0.4 ms
Lead Channel Pacing Threshold Pulse Width: 0.4 ms
Lead Channel Setting Pacing Amplitude: 2.25 V
Lead Channel Setting Pacing Amplitude: 2.5 V
Lead Channel Setting Pacing Pulse Width: 0.4 ms
MDC IDC LEAD IMPLANT DT: 20080818
MDC IDC LEAD LOCATION: 753858
MDC IDC LEAD LOCATION: 753860
MDC IDC MSMT BATTERY REMAINING LONGEVITY: 77 mo
MDC IDC MSMT LEADCHNL LV IMPEDANCE VALUE: 380 Ohm
MDC IDC MSMT LEADCHNL LV IMPEDANCE VALUE: 4047 Ohm
MDC IDC MSMT LEADCHNL LV PACING THRESHOLD AMPLITUDE: 0.75 V
MDC IDC MSMT LEADCHNL RA SENSING INTR AMPL: 4.125 mV
MDC IDC MSMT LEADCHNL RV IMPEDANCE VALUE: 304 Ohm
MDC IDC MSMT LEADCHNL RV IMPEDANCE VALUE: 342 Ohm
MDC IDC MSMT LEADCHNL RV PACING THRESHOLD AMPLITUDE: 1 V
MDC IDC MSMT LEADCHNL RV SENSING INTR AMPL: 13 mV
MDC IDC PG IMPLANT DT: 20170531
MDC IDC SET LEADCHNL RA PACING AMPLITUDE: 1.5 V
MDC IDC SET LEADCHNL RV PACING PULSEWIDTH: 0.4 ms
MDC IDC SET LEADCHNL RV SENSING SENSITIVITY: 0.3 mV
MDC IDC STAT BRADY AP VS PERCENT: 0.01 %
MDC IDC STAT BRADY AS VP PERCENT: 89.51 %
MDC IDC STAT BRADY AS VS PERCENT: 10.29 %

## 2017-02-23 LAB — CBC
HEMATOCRIT: 34.5 % — AB (ref 36.0–46.0)
HEMOGLOBIN: 11.1 g/dL — AB (ref 12.0–15.0)
MCH: 28 pg (ref 26.0–34.0)
MCHC: 32.2 g/dL (ref 30.0–36.0)
MCV: 86.9 fL (ref 78.0–100.0)
PLATELETS: 151 10*3/uL (ref 150–400)
RBC: 3.97 MIL/uL (ref 3.87–5.11)
RDW: 15.4 % (ref 11.5–15.5)
WBC: 5.4 10*3/uL (ref 4.0–10.5)

## 2017-02-23 LAB — TROPONIN I
TROPONIN I: 0.04 ng/mL — AB (ref <0.03)
Troponin I: 0.05 ng/mL (ref <0.03)
Troponin I: 0.05 ng/mL (ref <0.03)

## 2017-02-23 LAB — TSH: TSH: 1.189 u[IU]/mL (ref 0.350–4.500)

## 2017-02-23 MED ORDER — ACETAMINOPHEN 325 MG PO TABS: 650.0000 mg | ORAL_TABLET | Freq: Four times a day (QID) | ORAL | Status: DC | PRN

## 2017-02-23 MED ORDER — GUAIFENESIN-DM 100-10 MG/5ML PO SYRP
5.0000 mL | ORAL_SOLUTION | ORAL | Status: DC | PRN
  Administered 2017-02-23 – 2017-02-28 (×10): 5 mL via ORAL
  Filled 2017-02-23 (×10): qty 5

## 2017-02-23 MED ORDER — TRAZODONE HCL 50 MG PO TABS
50.0000 mg | ORAL_TABLET | Freq: Every day | ORAL | Status: DC
  Administered 2017-02-23 – 2017-02-28 (×7): 50 mg via ORAL
  Filled 2017-02-23 (×7): qty 1

## 2017-02-23 MED ORDER — CARVEDILOL 25 MG PO TABS
25.0000 mg | ORAL_TABLET | Freq: Two times a day (BID) | ORAL | Status: DC
  Administered 2017-02-23 – 2017-03-01 (×11): 25 mg via ORAL
  Filled 2017-02-23 (×10): qty 1

## 2017-02-23 MED ORDER — POTASSIUM CHLORIDE CRYS ER 20 MEQ PO TBCR
40.0000 meq | EXTENDED_RELEASE_TABLET | Freq: Once | ORAL | Status: AC
  Administered 2017-02-23: 40 meq via ORAL
  Filled 2017-02-23: qty 2

## 2017-02-23 MED ORDER — ALBUTEROL SULFATE (2.5 MG/3ML) 0.083% IN NEBU: 3.0000 mL | INHALATION_SOLUTION | Freq: Four times a day (QID) | RESPIRATORY_TRACT | Status: DC | PRN

## 2017-02-23 MED ORDER — SPIRONOLACTONE 25 MG PO TABS
25.0000 mg | ORAL_TABLET | Freq: Every day | ORAL | Status: DC
  Administered 2017-02-23 – 2017-03-01 (×7): 25 mg via ORAL
  Filled 2017-02-23 (×7): qty 1

## 2017-02-23 MED ORDER — SACUBITRIL-VALSARTAN 97-103 MG PO TABS
1.0000 | ORAL_TABLET | Freq: Two times a day (BID) | ORAL | Status: DC
  Administered 2017-02-23 – 2017-03-01 (×14): 1 via ORAL
  Filled 2017-02-23 (×14): qty 1

## 2017-02-23 MED ORDER — FLUTICASONE PROPIONATE 50 MCG/ACT NA SUSP
2.0000 | Freq: Every day | NASAL | Status: DC
  Administered 2017-02-23 – 2017-03-01 (×6): 2 via NASAL
  Filled 2017-02-23: qty 16

## 2017-02-23 MED ORDER — ONDANSETRON HCL 4 MG PO TABS: 4.0000 mg | ORAL_TABLET | Freq: Four times a day (QID) | ORAL | Status: DC | PRN

## 2017-02-23 MED ORDER — SERTRALINE HCL 100 MG PO TABS
100.0000 mg | ORAL_TABLET | Freq: Every day | ORAL | Status: DC
  Administered 2017-02-23 – 2017-03-01 (×7): 100 mg via ORAL
  Filled 2017-02-23 (×7): qty 1

## 2017-02-23 MED ORDER — APIXABAN 5 MG PO TABS
5.0000 mg | ORAL_TABLET | Freq: Two times a day (BID) | ORAL | Status: DC
  Administered 2017-02-23 – 2017-03-01 (×14): 5 mg via ORAL
  Filled 2017-02-23 (×14): qty 1

## 2017-02-23 MED ORDER — ISOSORB DINITRATE-HYDRALAZINE 20-37.5 MG PO TABS
1.0000 | ORAL_TABLET | Freq: Two times a day (BID) | ORAL | Status: DC
  Administered 2017-02-23 – 2017-03-01 (×14): 1 via ORAL
  Filled 2017-02-23 (×14): qty 1

## 2017-02-23 MED ORDER — ACETAMINOPHEN 650 MG RE SUPP: 650.0000 mg | Freq: Four times a day (QID) | RECTAL | Status: DC | PRN

## 2017-02-23 MED ORDER — ONDANSETRON HCL 4 MG/2ML IJ SOLN: 4.0000 mg | Freq: Four times a day (QID) | INTRAMUSCULAR | Status: DC | PRN

## 2017-02-23 MED ORDER — NITROGLYCERIN 0.4 MG SL SUBL: 0.4000 mg | SUBLINGUAL_TABLET | SUBLINGUAL | Status: DC | PRN

## 2017-02-23 MED ORDER — LEVOTHYROXINE SODIUM 112 MCG PO TABS
137.0000 ug | ORAL_TABLET | Freq: Every day | ORAL | Status: DC
  Administered 2017-02-23 – 2017-03-01 (×6): 137 ug via ORAL
  Filled 2017-02-23 (×7): qty 1

## 2017-02-23 MED ORDER — FUROSEMIDE 10 MG/ML IJ SOLN
80.0000 mg | Freq: Two times a day (BID) | INTRAMUSCULAR | Status: DC
  Administered 2017-02-23 – 2017-02-25 (×5): 80 mg via INTRAVENOUS
  Filled 2017-02-23 (×5): qty 8

## 2017-02-23 MED ORDER — HYDRALAZINE HCL 20 MG/ML IJ SOLN
10.0000 mg | INTRAMUSCULAR | Status: DC | PRN
  Administered 2017-02-28: 10 mg via INTRAVENOUS
  Filled 2017-02-23: qty 1

## 2017-02-23 MED ORDER — TRAMADOL HCL 50 MG PO TABS: 25.0000 mg | ORAL_TABLET | Freq: Two times a day (BID) | ORAL | Status: DC | PRN

## 2017-02-23 MED ORDER — FUROSEMIDE 10 MG/ML IJ SOLN
40.0000 mg | Freq: Once | INTRAMUSCULAR | Status: AC
  Administered 2017-02-23: 40 mg via INTRAVENOUS
  Filled 2017-02-23: qty 4

## 2017-02-23 NOTE — Progress Notes (Addendum)
Progress Note  Patient Name: Morgan Clay Date of Encounter: 02/23/2017  Primary Cardiologist: Dr. Caryl Comes  Subjective   No complaints this am.  Denies any chest pain or SOB today.    Inpatient Medications    Scheduled Meds: . apixaban  5 mg Oral BID  . carvedilol  25 mg Oral BID WC  . fluticasone  2 spray Each Nare Daily  . furosemide  80 mg Intravenous Q12H  . isosorbide-hydrALAZINE  1 tablet Oral BID  . levothyroxine  137 mcg Oral QAC breakfast  . nitroGLYCERIN  1 inch Topical Q6H  . sacubitril-valsartan  1 tablet Oral BID  . sertraline  100 mg Oral Daily  . traZODone  50 mg Oral QHS   Continuous Infusions:  PRN Meds: acetaminophen **OR** acetaminophen, albuterol, guaiFENesin-dextromethorphan, hydrALAZINE, nitroGLYCERIN, ondansetron **OR** ondansetron (ZOFRAN) IV, traMADol   Vital Signs    Vitals:   02/23/17 0200 02/23/17 0300 02/23/17 0415 02/23/17 0543  BP: (!) 121/58 (!) 158/65 (!) 163/55 (!) 152/51  Pulse: (!) 54 (!) 55 (!) 51   Resp: 20 20 18    Temp:   98.3 F (36.8 C) 98.5 F (36.9 C)  TempSrc:   Oral Oral  SpO2: 98% 99% 97% 100%  Weight:   151 lb 12.8 oz (68.9 kg)   Height:   4\' 11"  (1.499 m)     Intake/Output Summary (Last 24 hours) at 02/23/17 1009 Last data filed at 02/23/17 0800  Gross per 24 hour  Intake              600 ml  Output                0 ml  Net              600 ml   Filed Weights   02/22/17 1810 02/23/17 0415  Weight: 154 lb 9.6 oz (70.1 kg) 151 lb 12.8 oz (68.9 kg)    Telemetry    Atrial flutter with V pacing - Personally Reviewed  ECG    Atrial flutter with V pacing - Personally Reviewed  Physical Exam   GEN: No acute distress.   Neck: + JVD Cardiac: RRR, no rubs, or gallops. 2/6 SM at RUSB to LLSB Respiratory: Crackles at bases GI: Soft, nontender, non-distended  MS: No edema; No deformity. Neuro:  Nonfocal  Psych: Normal affect   Labs    Chemistry Recent Labs Lab 02/22/17 1817 02/23/17 0708  NA  136 139  K 3.6 3.2*  CL 101 105  CO2 23 23  GLUCOSE 125* 108*  BUN 14 17  CREATININE 1.37* 1.45*  CALCIUM 9.3 8.8*  GFRNONAA 38* 36*  GFRAA 44* 42*  ANIONGAP 12 11     Hematology Recent Labs Lab 02/22/17 1817 02/23/17 0708  WBC 6.2 5.4  RBC 4.36 3.97  HGB 12.5 11.1*  HCT 38.3 34.5*  MCV 87.8 86.9  MCH 28.7 28.0  MCHC 32.6 32.2  RDW 15.2 15.4  PLT 181 151    Cardiac Enzymes Recent Labs Lab 02/22/17 1817 02/23/17 0104 02/23/17 0708  TROPONINI 0.04* 0.05* 0.05*    Recent Labs Lab 02/22/17 1845  TROPIPOC 0.03     BNP Recent Labs Lab 02/22/17 2126  BNP 1,275.8*     DDimer No results for input(s): DDIMER in the last 168 hours.   Radiology    Dg Chest 2 View  Result Date: 02/22/2017 CLINICAL DATA:  Fluid overload. EXAM: CHEST  2 VIEW COMPARISON:  Chest x-ray dated  Oct 01, 2015. FINDINGS: Stable cardiomegaly. Unchanged left chest wall AICD. Lower lobe predominant interstitial markings. No focal consolidation, pleural effusion, or pneumothorax. No acute osseous abnormality. IMPRESSION: Cardiomegaly and mild pulmonary vascular congestion, unchanged. Electronically Signed   By: Titus Dubin M.D.   On: 02/22/2017 18:55    Cardiac Studies   none  Patient Profile     71 y.o. female with a history of nonischemic CM, HFrEF (45%), s/p Bi-ventricular pacer/ICD, obesity, OSA, AFL, and CKD p/w SOB.  Patient reports approximately 10 pounds of weight gain over the past 2 weeks. With this she has become progressively more short of breath with exertion. She reports bilateral lower extremity edema, abdominal fullness as well. She denies having missed any doses of her home lasix and has not made any recent changes to her diet. She was seen by her electrophysiologist in the office and thought to be volume overloaded, thus recommended that she come to the ED for evaluation. She denies orthopnea, PND, chest pain, syncope, palpitations.   Assessment & Plan    Acute on  chronic combined systolic/diastolic CHF - she denies any recent change in her sodium intake.  She thinks she is up around 10lbs from her dry weight.   - I&O's appear incomplete but weight down 3lbs from admit - continue lasix 80mg  IV BID  - continue Carvedilol 25mg  BID, Bidil 20-37.5mg  BID and Entresto 97-103mg  BID - she is not on spironolactone and K+ is low so will add this 25mg  daily and follow renal function closely - repeat echo as she had moderate AR a year ago on echo and may have gotten worse resulting in CHF decompensation.  - check TSH  NIDCM with chronic LBBB -  s/p BiV AICD - no ischemia on nuclear stress test 2015 - EF by echo 2017 45-50% - continue BB, Entresto, Bidil - add spironolactone  HTN - BP controlled on current meds - continue BB, Entresto, BiDil  Atypical atrial flutter with remote DCCV 2016.  - HR controlled - Per Dr. Olin Pia note - plan to consider DCCV once she has been diuresed and nearing euvolemia.  He suspects that atrial flutter is contributing to her decompensation recently - continue Apixaban and BB. - need to confirm that she has not missed any doses of her apixaban otherwise she will need TEE/DCCV  Hypokalemia - replete potassium    For questions or updates, please contact [place] Please consult [email or web address] for contact info under Cardiology/STEMI.      Signed, Fransico Him, MD  02/23/2017, 10:09 AM

## 2017-02-23 NOTE — Progress Notes (Signed)
New Admission Note:   Arrival Method: Bed with ED tech and RN Mental Orientation: A&O x4 Telemetry: initiated and verified, patient has ICD Skin: WNL Pain: denies Admission: Patient complains of productive cough.  Orders to be reviewed and implemented. Will continue to monitor the patient. Call light has been placed within reach and bed alarm has been activated.   Riki Altes, RN Phone: [phone]

## 2017-02-23 NOTE — Telephone Encounter (Signed)
Left message for patient to call back  

## 2017-02-23 NOTE — ED Notes (Signed)
Pt. Ambulate to bathroom.

## 2017-02-23 NOTE — Progress Notes (Signed)
PROGRESS NOTE  Morgan Clay FOY:[id number] DOB: 02-Jun-1945 DOA: 02/22/2017 PCP: [name], DO  HPI/Recap of past 24 hours: Morgan Clay is a 71 y.o. female with history of nonischemic cardiomyopathy with EF 45-50% (01/14/16), atrial flutter, status post AICD and biventricular pacemaker, hypothyroidism, chronic kidney disease was referred to the ER by patient's cardiologist due to a-flutter in the office and 10 lbs weight gain from acute fluid overload. Patient reports had AICD for 8 years and was replaced last year. Reports compliance to her meds.  Cardiology saw the patient and recommended aggressive diuresis prior to electrocardioversion. Patient denies any chest pain, dyspnea, or palpitations.  Physical exam: General: Well developed in no acute distress. Eyes: Anicteric no pallor. ENMT: good hearing. No erythema or exudates. Neck: No JVD.  No thyromegaly. Respiratory: Mild expiratory wheeze heard with crepitations. Cardiovascular: S1-S2 heard. RRR, no murmurs, rubs or gallops. Abdomen: Soft nontender bowel sounds present. Musculoskeletal: No LE edema. Skin: No rash. Neurologic: Alert awake oriented x4. Moves all extremities. Psychiatric: Normal mood and affect.   Objective: Vitals:   02/23/17 0200 02/23/17 0300 02/23/17 0415 02/23/17 0543  BP: (!) 121/58 (!) 158/65 (!) 163/55 (!) 152/51  Pulse: (!) 54 (!) 55 (!) 51   Resp: 20 20 18    Temp:   98.3 F (36.8 C) 98.5 F (36.9 C)  TempSrc:   Oral Oral  SpO2: 98% 99% 97% 100%  Weight:   68.9 kg (151 lb 12.8 oz)   Height:   4\' 11"  (1.499 m)     Intake/Output Summary (Last 24 hours) at 02/23/17 1144 Last data filed at 02/23/17 1021  Gross per 24 hour  Intake              600 ml  Output              200 ml  Net              400 ml   Filed Weights   02/22/17 1810 02/23/17 0415  Weight: 70.1 kg (154 lb 9.6 oz) 68.9 kg (151 lb 12.8 oz)     Data Reviewed: CBC:  Recent Labs Lab 02/22/17 1817  02/23/17 0708  WBC 6.2 5.4  HGB 12.5 11.1*  HCT 38.3 34.5*  MCV 87.8 86.9  PLT 181 767   Basic Metabolic Panel:  Recent Labs Lab 02/22/17 1817 02/23/17 0708  NA 136 139  K 3.6 3.2*  CL 101 105  CO2 23 23  GLUCOSE 125* 108*  BUN 14 17  CREATININE 1.37* 1.45*  CALCIUM 9.3 8.8*   GFR: Estimated Creatinine Clearance: 30.9 mL/min (A) (by C-G formula based on SCr of 1.45 mg/dL (H)). Liver Function Tests: No results for input(s): AST, ALT, ALKPHOS, BILITOT, PROT, ALBUMIN in the last 168 hours. No results for input(s): LIPASE, AMYLASE in the last 168 hours. No results for input(s): AMMONIA in the last 168 hours. Coagulation Profile: No results for input(s): INR, PROTIME in the last 168 hours. Cardiac Enzymes:  Recent Labs Lab 02/22/17 1817 02/23/17 0104 02/23/17 0708  TROPONINI 0.04* 0.05* 0.05*   BNP (last 3 results) No results for input(s): PROBNP in the last 8760 hours. HbA1C: No results for input(s): HGBA1C in the last 72 hours. CBG: No results for input(s): GLUCAP in the last 168 hours. Lipid Profile: No results for input(s): CHOL, HDL, LDLCALC, TRIG, CHOLHDL, LDLDIRECT in the last 72 hours. Thyroid Function Tests: No results for input(s): TSH, T4TOTAL, FREET4, T3FREE, THYROIDAB in the last 72 hours.  Anemia Panel: No results for input(s): VITAMINB12, FOLATE, FERRITIN, TIBC, IRON, RETICCTPCT in the last 72 hours. Urine analysis:    Component Value Date/Time   COLORURINE YELLOW 10/02/2015 0330   APPEARANCEUR CLEAR 10/02/2015 0330   LABSPEC 1.016 10/02/2015 0330   PHURINE 5.5 10/02/2015 0330   GLUCOSEU NEGATIVE 10/02/2015 0330   HGBUR TRACE (A) 10/02/2015 0330   HGBUR negative 02/28/2008 0000   BILIRUBINUR NEGATIVE 10/02/2015 0330   KETONESUR NEGATIVE 10/02/2015 0330   PROTEINUR >300 (A) 10/02/2015 0330   UROBILINOGEN 1.0 07/19/2010 1752   NITRITE NEGATIVE 10/02/2015 0330   LEUKOCYTESUR NEGATIVE 10/02/2015 0330   Sepsis  Labs: @LABRCNTIP (procalcitonin:4,lacticidven:4)  )No results found for this or any previous visit (from the past 240 hour(s)).    Studies: Dg Chest 2 View  Result Date: 02/22/2017 CLINICAL DATA:  Fluid overload. EXAM: CHEST  2 VIEW COMPARISON:  Chest x-ray dated Oct 01, 2015. FINDINGS: Stable cardiomegaly. Unchanged left chest wall AICD. Lower lobe predominant interstitial markings. No focal consolidation, pleural effusion, or pneumothorax. No acute osseous abnormality. IMPRESSION: Cardiomegaly and mild pulmonary vascular congestion, unchanged. Electronically Signed   By: Titus Dubin M.D.   On: 02/22/2017 18:55    Scheduled Meds: . apixaban  5 mg Oral BID  . carvedilol  25 mg Oral BID WC  . fluticasone  2 spray Each Nare Daily  . furosemide  80 mg Intravenous Q12H  . isosorbide-hydrALAZINE  1 tablet Oral BID  . levothyroxine  137 mcg Oral QAC breakfast  . sacubitril-valsartan  1 tablet Oral BID  . sertraline  100 mg Oral Daily  . spironolactone  25 mg Oral Daily  . traZODone  50 mg Oral QHS    Continuous Infusions:   LOS: 0 days    Assessment/Plan: Principal Problem:  Atrial flutter in the setting of fluid overload -Cardiology following and plans to aggressively diurese and electrocardiovert once euvolemic.or back to baseline dry weight. -continue lasix 80 mg BID -Monitor U/O, BP, and HR    Acute on chronic systolic CHF (congestive heart failure) ([place]) - last 2d echo 01/14/16 EF 45-50% -Repeat echo: defer to Cardiology   -lasix 80mg  BID, BIDIL, entresto, aldactone    Hypothyroidism -TSH - levothyroxine    Biventricular ICD (implantable cardioverter-defibrillator) in place - cardiology following    Nonischemic cardiomyopathy EF 45-50 % (2017) -Cardiology following -management as stated above    Type 2 diabetes, controlled, with renal manifestation ([place]) -stable -will get A1C    Essential hypertension -Management as stated above   Code Status: Full  code  Family Communication: none  Disposition Plan: Will stay another midnight per cardiology recommendations to aggressively diurese and plans for electrocardioversion.   Consultants:  Cardiology  Procedures:  None  Antimicrobials:  Not indicated  DVT prophylaxis: Eliquis for a-flutter     Kayleen Memos, MD [facility] Pager [phone]  If 7PM-7AM, please contact night-coverage [email or web address] Password [email or web address] 02/23/2017, 11:44 AM

## 2017-02-23 NOTE — H&P (Signed)
History and Physical    Morgan Clay BPZ:[id number] DOB: 26-Jun-1945 DOA: 02/22/2017  PCP: [name], DO  Patient coming from: Home.  Chief Complaint: Shortness of breath.  HPI: Morgan Clay is a 71 y.o. female with history of nonischemic cardiomyopathy, atrial flutter, status post AICD and biventricular pacemaker, hypothyroidism, chronic kidney disease was referred to the ER by patient's cardiologist to have the patient was found to be short of breath and hypertensive.  Patient had followed up for a routine visit to her cardiologist office and at that time had complained of shortness of breath.  Patient has been compliant with her Lasix and other medications.  ED Course: In the ER with chest x-ray shows congestion BNP was 1200 troponin was mildly positive.  Patient denies any chest pain.  Cardiology was consulted.  The patient was given a total of 80 mg IV Lasix.  Admitted for acute CHF.  Patient states over the last 2 weeks the patient had put on 20 pounds.  Review of Systems: As per HPI, rest all negative.   Past Medical History:  Diagnosis Date  . AICD (automatic cardioverter/defibrillator) present   . Arthritis   . Asthma    reports mild asthma since childhood - had COPD on dx list from prior PCP  . Atypical atrial flutter ([place]) 09/28/2015  . Biventricular implantable cardiac defibrillator in situ 12/24/2006   Protect[employer]- [employer]  ICD gen change 2012  . Chronic systolic congestive heart failure ([place])   . DEPRESSION 12/17/2009   Annotation: PHQ-9 score = 14 done on 12/17/2009 Qualifier: Diagnosis of  By: Hassell Done FNP, Tori Milks    . FIBROIDS, UTERUS 03/05/2008   Qualifier: Diagnosis of  By: Isla Pence    . Glaucoma   . Hepatitis C 01/04/2007   Qualifier: Diagnosis of  By: Hassell Done FNP, Tori Milks    . HEPATITIS C - s/p treatment with Harvoni, seeing hepatology, Dawn Drazek  01/04/2007   Qualifier: Diagnosis of  By: Hassell Done FNP, Tori Milks    . Hx of cardiovascular stress  test    Lexiscan Myoview (12/15): No ischemia, EF 33%, high risk due to depressed LV function   . Hx of echocardiogram    Echo (12/15): Severe LVH, EF 25-30%, diffuse HK, restrictive physiology, mild AI, mild MR, moderate LAE, mild RVE, mild RAE, moderate to severe TR, mild increased PASP  . Hyperlipemia 12/06/2012  . HYPERTENSION, BENIGN 04/24/2007   Qualifier: Diagnosis of  By: Hassell Done FNP, Tori Milks    . Hypothyroidism   . LBBB (left bundle branch block)   . Nonischemic cardiomyopathy ([place])    Followed by [facility]  . OBESITY 05/27/2009   Qualifier: Diagnosis of  By: Hassell Done FNP, Tori Milks    . OBSTRUCTIVE SLEEP APNEA 11/14/2007   npsg 2009:  Mild osa with AHI 11/hr. Started cpap 2009, quit using due to lack of response Home sleep testing 09/2010 with AHI only 7/hr and weight loss advised by pulmonology.  . OSTEOPENIA 09/30/2008   Qualifier: Diagnosis of  By: Hassell Done FNP, Tori Milks    . Personal history of other infectious and parasitic disease    Hepatitis B    Past Surgical History:  Procedure Laterality Date  . appendectomy    . CARDIAC CATHETERIZATION    . CARDIOVERSION N/A 12/14/2014   Procedure: CARDIOVERSION;  Surgeon: Dorothy Spark, MD;  Location: [facility] ENDOSCOPY;  Service: Cardiovascular;  Laterality: N/A;  . EP IMPLANTABLE DEVICE N/A 10/06/2015   Procedure: BIV ICD Generator Changeout;  Surgeon: Revonda Standard  Caryl Comes, MD;  Location: [facility] CV LAB;  Service: Cardiovascular;  Laterality: N/A;  . INSERT / REPLACE / REMOVE PACEMAKER     concerto  . F64.33     Concerto [id number]     reports that she has never smoked. She has never used smokeless tobacco. She reports that she drinks alcohol. She reports that she does not use drugs.  No Known Allergies  Family History  Problem Relation Age of Onset  . Asthma Father   . Heart attack Father   . Asthma Sister   . Cancer Sister        lung  . Heart attack Mother   . Stroke Brother   . Diabetes Unknown     Prior to Admission  medications   Medication Sig Start Date End Date Taking? Authorizing Provider  albuterol (PROVENTIL HFA;VENTOLIN HFA) 108 (90 BASE) MCG/ACT inhaler Inhale 2 puffs into the lungs every 6 (six) hours as needed for wheezing or shortness of breath. 01/01/15  Yes [name], DO  apixaban (ELIQUIS) 5 MG TABS tablet Take 1 tablet (5 mg total) by mouth 2 (two) times daily. 04/20/16  Yes [name], MD  BIDIL 20-37.5 MG tablet TAKE ONE TABLET BY MOUTH TWICE DAILY 02/12/17  Yes [name], MD  carvedilol (COREG) 25 MG tablet Take 1 tablet (25 mg total) by mouth 2 (two) times daily with a meal. 05/21/13  Yes Wellington Hampshire, MD  ENTRESTO 97-103 MG TAKE ONE TABLET BY MOUTH TWICE DAILY **TITRATED  DOSE  UP** 02/12/17  Yes [name], MD  fluticasone [facility]) 50 MCG/ACT nasal spray Place 2 sprays into both nostrils daily. 01/24/17  Yes Nafziger, Tommi Rumps, NP  furosemide (LASIX) 40 MG tablet Take 1 tablet (40 mg total) by mouth 2 (two) times daily.  May take additional 40 mg tablet as needed for fluid symptoms. 04/13/16  Yes [name], MD  levothyroxine (SYNTHROID, LEVOTHROID) 137 MCG tablet TAKE 1 TABLET BY MOUTH ONCE DAILY BEFORE BREAKFAST 02/09/17  Yes [name], DO  nitroGLYCERIN (NITROSTAT) 0.4 MG SL tablet Place 0.4 mg under the tongue every 5 (five) minutes as needed for chest pain.   Yes [provider]  sertraline (ZOLOFT) 100 MG tablet Take 1 tablet (100 mg total) by mouth daily. 06/30/14  Yes [name], DO  traMADol (ULTRAM) 50 MG tablet 1/2 to 1 tablet as needed for severe pain not more then twice daily. Patient taking differently: Take 25-50 mg by mouth every 12 (twelve) hours as needed for moderate pain.  08/30/15  Yes [name], DO  traZODone (DESYREL) 50 MG tablet Take 1 tablet (50 mg total) by mouth at bedtime. 06/30/15  Yes [name], DO    Physical Exam: Vitals:   02/22/17 1810 02/22/17 2200 02/22/17 2315 02/22/17 2330  BP:  (!) 162/65 (!) 184/81 (!)  156/59  Pulse:  (!) 53 60 60  Resp:  16 (!) 21 14  Temp:      TempSrc:      SpO2:  99% 96% 99%  Weight: 70.1 kg (154 lb 9.6 oz)     Height: 4' 11.5" (1.511 m)         Constitutional: Moderately built and nourished. Vitals:   02/22/17 1810 02/22/17 2200 02/22/17 2315 02/22/17 2330  BP:  (!) 162/65 (!) 184/81 (!) 156/59  Pulse:  (!) 53 60 60  Resp:  16 (!) 21 14  Temp:  TempSrc:      SpO2:  99% 96% 99%  Weight: 70.1 kg (154 lb 9.6 oz)     Height: 4' 11.5" (1.511 m)      Eyes: Anicteric no pallor. ENMT: No discharge from the ears eyes nose or mouth. Neck: JVD elevated.  No mass felt. Respiratory: Mild expiratory wheeze heard with crepitations. Cardiovascular: S1-S2 heard. Abdomen: Soft nontender bowel sounds present. Musculoskeletal: No edema. Skin: No rash. Neurologic: Alert awake oriented to time place and person.  Moves all extremities. Psychiatric: Appears normal.  Normal affect.   Labs on Admission: I have personally reviewed following labs and imaging studies  CBC:  Recent Labs Lab 02/22/17 1817  WBC 6.2  HGB 12.5  HCT 38.3  MCV 87.8  PLT 448   Basic Metabolic Panel:  Recent Labs Lab 02/22/17 1817  NA 136  K 3.6  CL 101  CO2 23  GLUCOSE 125*  BUN 14  CREATININE 1.37*  CALCIUM 9.3   GFR: Estimated Creatinine Clearance: 33.5 mL/min (A) (by C-G formula based on SCr of 1.37 mg/dL (H)). Liver Function Tests: No results for input(s): AST, ALT, ALKPHOS, BILITOT, PROT, ALBUMIN in the last 168 hours. No results for input(s): LIPASE, AMYLASE in the last 168 hours. No results for input(s): AMMONIA in the last 168 hours. Coagulation Profile: No results for input(s): INR, PROTIME in the last 168 hours. Cardiac Enzymes:  Recent Labs Lab 02/22/17 1817  TROPONINI 0.04*   BNP (last 3 results) No results for input(s): PROBNP in the last 8760 hours. HbA1C: No results for input(s): HGBA1C in the last 72 hours. CBG: No results for input(s): GLUCAP  in the last 168 hours. Lipid Profile: No results for input(s): CHOL, HDL, LDLCALC, TRIG, CHOLHDL, LDLDIRECT in the last 72 hours. Thyroid Function Tests: No results for input(s): TSH, T4TOTAL, FREET4, T3FREE, THYROIDAB in the last 72 hours. Anemia Panel: No results for input(s): VITAMINB12, FOLATE, FERRITIN, TIBC, IRON, RETICCTPCT in the last 72 hours. Urine analysis:    Component Value Date/Time   COLORURINE YELLOW 10/02/2015 0330   APPEARANCEUR CLEAR 10/02/2015 0330   LABSPEC 1.016 10/02/2015 0330   PHURINE 5.5 10/02/2015 0330   GLUCOSEU NEGATIVE 10/02/2015 0330   HGBUR TRACE (A) 10/02/2015 0330   HGBUR negative 02/28/2008 0000   BILIRUBINUR NEGATIVE 10/02/2015 0330   KETONESUR NEGATIVE 10/02/2015 0330   PROTEINUR >300 (A) 10/02/2015 0330   UROBILINOGEN 1.0 07/19/2010 1752   NITRITE NEGATIVE 10/02/2015 0330   LEUKOCYTESUR NEGATIVE 10/02/2015 0330   Sepsis Labs: @LABRCNTIP (procalcitonin:4,lacticidven:4) )No results found for this or any previous visit (from the past 240 hour(s)).   Radiological Exams on Admission: Dg Chest 2 View  Result Date: 02/22/2017 CLINICAL DATA:  Fluid overload. EXAM: CHEST  2 VIEW COMPARISON:  Chest x-ray dated Oct 01, 2015. FINDINGS: Stable cardiomegaly. Unchanged left chest wall AICD. Lower lobe predominant interstitial markings. No focal consolidation, pleural effusion, or pneumothorax. No acute osseous abnormality. IMPRESSION: Cardiomegaly and mild pulmonary vascular congestion, unchanged. Electronically Signed   By: Titus Dubin M.D.   On: 02/22/2017 18:55    EKG: Independently reviewed.  Paced rhythm.  Assessment/Plan Principal Problem:   Acute on chronic systolic CHF (congestive heart failure) (HCC) Active Problems:   Hypothyroidism   OBSTRUCTIVE SLEEP APNEA   Biventricular ICD (implantable cardioverter-defibrillator) in place   Nonischemic cardiomyopathy (HCC)   Type 2 diabetes, controlled, with renal manifestation (HCC)   Atypical  atrial flutter (HCC)   CHF (congestive heart failure) ([place])    1. Acute  on chronic systolic heart failure/nonischemic cardiomyopathy status post AICD and biventricular pacemaker placement -last EF measured was in September 2017 was 45-50%.  Patient is already on BiDil and Entresto.  Patient is placed on Lasix 80 mg IV every 8.  Cycle cardiac markers.  Closely follow daily weights intake output and metabolic panel.  Appreciate cardiology consult. 2. Hypothyroidism on Synthroid. 3. Chronic kidney disease stage III -creatinine appears to be at baseline. 4. Atrial flutter -rate controlled on Coreg.  Patient is on apixaban.  Chads 2 vasc score more than 2. 5. Hypertensive urgency -improved with Lasix in the ER.  Closely monitor blood pressure trends.  Continue home medications including Entresto Coreg and BiDil.  As needed IV hydralazine.  Chart mentions diabetes mellitus and sleep apnea.  Patient states she does not use any CPAP.  And has not taken any diabetic medication for many years.   DVT prophylaxis: Apixaban. Code Status: Full code. Family Communication: Discussed with patient. Disposition Plan: Home. Consults called: Cardiology. Admission status: Inpatient.   Rise Patience MD [facility] Pager [phone].  If 7PM-7AM, please contact night-coverage [email or web address] Password [email or web address]  02/23/2017, 12:40 AM

## 2017-02-23 NOTE — Telephone Encounter (Signed)
New message  Pt daughter call requesting to speak with RN. She states she is concerned for pts care at the hospital. She would like to discuss with RN .please call back.

## 2017-02-24 ENCOUNTER — Inpatient Hospital Stay (HOSPITAL_COMMUNITY): Payer: Medicare Other

## 2017-02-24 DIAGNOSIS — I361 Nonrheumatic tricuspid (valve) insufficiency: Secondary | ICD-10-CM

## 2017-02-24 DIAGNOSIS — E1122 Type 2 diabetes mellitus with diabetic chronic kidney disease: Secondary | ICD-10-CM

## 2017-02-24 DIAGNOSIS — I340 Nonrheumatic mitral (valve) insufficiency: Secondary | ICD-10-CM

## 2017-02-24 LAB — CBC WITH DIFFERENTIAL/PLATELET
BASOS ABS: 0 10*3/uL (ref 0.0–0.1)
Basophils Relative: 0 %
EOS ABS: 0.2 10*3/uL (ref 0.0–0.7)
Eosinophils Relative: 3 %
HCT: 35 % — ABNORMAL LOW (ref 36.0–46.0)
HEMOGLOBIN: 11.7 g/dL — AB (ref 12.0–15.0)
LYMPHS ABS: 2.1 10*3/uL (ref 0.7–4.0)
LYMPHS PCT: 38 %
MCH: 29 pg (ref 26.0–34.0)
MCHC: 33.4 g/dL (ref 30.0–36.0)
MCV: 86.6 fL (ref 78.0–100.0)
Monocytes Absolute: 0.4 10*3/uL (ref 0.1–1.0)
Monocytes Relative: 8 %
NEUTROS PCT: 51 %
Neutro Abs: 2.8 10*3/uL (ref 1.7–7.7)
Platelets: 164 10*3/uL (ref 150–400)
RBC: 4.04 MIL/uL (ref 3.87–5.11)
RDW: 15.3 % (ref 11.5–15.5)
WBC: 5.4 10*3/uL (ref 4.0–10.5)

## 2017-02-24 LAB — BASIC METABOLIC PANEL
Anion gap: 9 (ref 5–15)
BUN: 23 mg/dL — ABNORMAL HIGH (ref 6–20)
CHLORIDE: 103 mmol/L (ref 101–111)
CO2: 23 mmol/L (ref 22–32)
CREATININE: 1.68 mg/dL — AB (ref 0.44–1.00)
Calcium: 8.8 mg/dL — ABNORMAL LOW (ref 8.9–10.3)
GFR calc non Af Amer: 30 mL/min — ABNORMAL LOW (ref >60)
GFR, EST AFRICAN AMERICAN: 35 mL/min — AB (ref >60)
Glucose, Bld: 114 mg/dL — ABNORMAL HIGH (ref 65–99)
POTASSIUM: 3.9 mmol/L (ref 3.5–5.1)
SODIUM: 135 mmol/L (ref 135–145)

## 2017-02-24 LAB — ECHOCARDIOGRAM COMPLETE
Height: 59 in
Weight: 2404.8 oz

## 2017-02-24 LAB — HEMOGLOBIN A1C
HEMOGLOBIN A1C: 6 % — AB (ref 4.8–5.6)
MEAN PLASMA GLUCOSE: 125.5 mg/dL

## 2017-02-24 MED ORDER — IPRATROPIUM-ALBUTEROL 0.5-2.5 (3) MG/3ML IN SOLN
3.0000 mL | Freq: Four times a day (QID) | RESPIRATORY_TRACT | Status: DC
  Administered 2017-02-24 (×2): 3 mL via RESPIRATORY_TRACT
  Filled 2017-02-24 (×2): qty 3

## 2017-02-24 MED ORDER — SODIUM CHLORIDE 3 % IN NEBU
4.0000 mL | INHALATION_SOLUTION | Freq: Every day | RESPIRATORY_TRACT | Status: AC
  Administered 2017-02-24 – 2017-02-25 (×2): 4 mL via RESPIRATORY_TRACT
  Filled 2017-02-24 (×3): qty 4

## 2017-02-24 MED ORDER — IPRATROPIUM-ALBUTEROL 0.5-2.5 (3) MG/3ML IN SOLN
3.0000 mL | Freq: Three times a day (TID) | RESPIRATORY_TRACT | Status: DC
  Administered 2017-02-25: 3 mL via RESPIRATORY_TRACT
  Filled 2017-02-24 (×5): qty 3

## 2017-02-24 NOTE — Progress Notes (Signed)
Patient complains of cough that is keeping her from sleeping at night.

## 2017-02-24 NOTE — Progress Notes (Addendum)
PROGRESS NOTE  Morgan Clay DJT:[id number] DOB: [birth year] DOA: 02/22/2017 PCP: [name], DO  HPI/Recap of past 24 hours: Morgan Clay a 71 y.o.femalewith history of nonischemic cardiomyopathy with EF 45-50% (01/14/16), atrial flutter, status post AICD and biventricular pacemaker, hypothyroidism, chronic kidney disease was referred to the ER by patient's cardiologist due to a-flutter in the office and 10 lbs weight gain from acute fluid overload. Patient reports had AICD for 8 years and was replaced last year. Reports compliance to her meds.  Overnight, patient reports dry cough kept her awake all night.  Denies any chest pain, dyspnea, palpitation, or dizziness when she ambulates.  She continues to be diuresed and tolerating well.  Physical exam: General: Well developed in no acute distress. Eyes:Anicteric no pallor. ENMT:good hearing. No erythema or exudates. Neck:No JVD. No thyromegaly. Respiratory:Mild expiratory wheeze heard with crepitations. Cardiovascular:S1-S2 heard. RRR, no murmurs, rubs or gallops. Abdomen:Soft nontender bowel sounds present. Musculoskeletal:No LE edema. Skin:No rash. Neurologic:Alert awake oriented x4. Moves all extremities. Psychiatric:Normal mood and affect.   Objective: Vitals:   02/24/17 1040 02/24/17 1134 02/24/17 1216 02/24/17 1350  BP: (!) 121/51  (!) 141/61   Pulse: (!) 51  (!) 56   Resp:   20   Temp:   98.6 F (37 C)   TempSrc:   Oral   SpO2: 100% 100% 100% 98%  Weight:      Height:        Intake/Output Summary (Last 24 hours) at 02/24/17 1553 Last data filed at 02/24/17 1501  Gross per 24 hour  Intake              720 ml  Output              400 ml  Net              320 ml   Filed Weights   02/22/17 1810 02/23/17 0415 02/24/17 0538  Weight: 70.1 kg (154 lb 9.6 oz) 68.9 kg (151 lb 12.8 oz) 68.2 kg (150 lb 4.8 oz)     Data Reviewed: CBC:  Recent Labs Lab 02/22/17 1817 02/23/17 0708  02/24/17 0505  WBC 6.2 5.4 5.4  NEUTROABS  --   --  2.8  HGB 12.5 11.1* 11.7*  HCT 38.3 34.5* 35.0*  MCV 87.8 86.9 86.6  PLT 181 151 300   Basic Metabolic Panel:  Recent Labs Lab 02/22/17 1817 02/23/17 0708 02/24/17 0505  NA 136 139 135  K 3.6 3.2* 3.9  CL 101 105 103  CO2 23 23 23   GLUCOSE 125* 108* 114*  BUN 14 17 23*  CREATININE 1.37* 1.45* 1.68*  CALCIUM 9.3 8.8* 8.8*   GFR: Estimated Creatinine Clearance: 26.5 mL/min (A) (by C-G formula based on SCr of 1.68 mg/dL (H)). Liver Function Tests: No results for input(s): AST, ALT, ALKPHOS, BILITOT, PROT, ALBUMIN in the last 168 hours. No results for input(s): LIPASE, AMYLASE in the last 168 hours. No results for input(s): AMMONIA in the last 168 hours. Coagulation Profile: No results for input(s): INR, PROTIME in the last 168 hours. Cardiac Enzymes:  Recent Labs Lab 02/22/17 1817 02/23/17 0104 02/23/17 0708 02/23/17 1226  TROPONINI 0.04* 0.05* 0.05* 0.04*   BNP (last 3 results) No results for input(s): PROBNP in the last 8760 hours. HbA1C:  Recent Labs  02/24/17 0505  HGBA1C 6.0*   CBG: No results for input(s): GLUCAP in the last 168 hours. Lipid Profile: No results for input(s): CHOL, HDL, LDLCALC, TRIG, CHOLHDL, LDLDIRECT  in the last 72 hours. Thyroid Function Tests:  Recent Labs  02/23/17 1226  TSH 1.189   Anemia Panel: No results for input(s): VITAMINB12, FOLATE, FERRITIN, TIBC, IRON, RETICCTPCT in the last 72 hours. Urine analysis:    Component Value Date/Time   COLORURINE YELLOW 10/02/2015 0330   APPEARANCEUR CLEAR 10/02/2015 0330   LABSPEC 1.016 10/02/2015 0330   PHURINE 5.5 10/02/2015 0330   GLUCOSEU NEGATIVE 10/02/2015 0330   HGBUR TRACE (A) 10/02/2015 0330   HGBUR negative 02/28/2008 0000   BILIRUBINUR NEGATIVE 10/02/2015 0330   KETONESUR NEGATIVE 10/02/2015 0330   PROTEINUR >300 (A) 10/02/2015 0330   UROBILINOGEN 1.0 07/19/2010 1752   NITRITE NEGATIVE 10/02/2015 0330    LEUKOCYTESUR NEGATIVE 10/02/2015 0330   Sepsis Labs: @LABRCNTIP (procalcitonin:4,lacticidven:4)  )No results found for this or any previous visit (from the past 240 hour(s)).    Studies: Dg Chest Port 1 View  Result Date: 02/24/2017 CLINICAL DATA:  Productive cough for 1 week. Acute shortness of breath today. EXAM: PORTABLE CHEST 1 VIEW COMPARISON:  02/22/2017 and prior radiograph FINDINGS: Cardiomegaly and left pacemaker/ ICD again noted. Pulmonary vascular congestion and mild interstitial opacities/edema noted. No pleural effusion or pneumothorax. No airspace disease or acute bony abnormality is identified. IMPRESSION: Cardiomegaly with mild pulmonary vascular congestion and mild interstitial opacities which may represent mild interstitial edema. Electronically Signed   By: Margarette Canada M.D.   On: 02/24/2017 11:54    Scheduled Meds: . apixaban  5 mg Oral BID  . carvedilol  25 mg Oral BID WC  . fluticasone  2 spray Each Nare Daily  . furosemide  80 mg Intravenous Q12H  . ipratropium-albuterol  3 mL Nebulization Q6H  . isosorbide-hydrALAZINE  1 tablet Oral BID  . levothyroxine  137 mcg Oral QAC breakfast  . sacubitril-valsartan  1 tablet Oral BID  . sertraline  100 mg Oral Daily  . sodium chloride HYPERTONIC  4 mL Nebulization Daily  . spironolactone  25 mg Oral Daily  . traZODone  50 mg Oral QHS    Continuous Infusions:   LOS: 1 day   Assessment/Plan: Principal Problem:  Atrial flutter in the setting of fluid overload -Cardiology following; plan for electrocardiovert once euvolemic -continue lasix IV 80 mg BID as recommended by cardiology. -Monitor U/O, BP, and HR    Acute on chronic systolic CHF (congestive heart failure) ([place]) - last 2D echo 01/14/16 EF 45-50% -Repeat echo: defer to Cardiology   -lasix 80mg  IV BID, BIDIL, entresto, aldactone  AKI on CKD 3 -On high dose lasix per cardiology -Avoid nephrotoxic meds -Continue close monitoring    Hypothyroidism -TSH - levothyroxine   Biventricular ICD (implantable cardioverter-defibrillator) in place - cardiology following   Nonischemic cardiomyopathy EF 45-50 % (2017) -Cardiology following -management as stated above  Type 2 diabetes, controlled, with renal manifestation ([place]) -History of type 2 diabetes -A1C 6.0 in the past 3 months   Essential hypertension -Management as stated above   Code Status: Full code  Family Communication: none  Disposition Plan: Will stay another midnight due to plan for electrocardioversion.   Consultants:  Cardiology  Procedures:  None  Antimicrobials:  Not indicated   DVT prophylaxis: Eliquis for A-flutter     Kayleen Memos, MD [facility] Pager [phone]  If 7PM-7AM, please contact night-coverage [email or web address] Password [email or web address] 02/24/2017, 3:53 PM

## 2017-02-24 NOTE — Progress Notes (Signed)
  Echocardiogram 2D Echocardiogram has been performed.  Sinthia Karabin T Jissel Slavens 02/24/2017, 12:21 PM

## 2017-02-24 NOTE — Progress Notes (Addendum)
Progress Note  Patient Name: Morgan Clay Date of Encounter: 02/24/2017  Primary Cardiologist: Dr. Caryl Comes  Subjective   Complains of dyspnea; no chest pain  Inpatient Medications    Scheduled Meds: . apixaban  5 mg Oral BID  . carvedilol  25 mg Oral BID WC  . fluticasone  2 spray Each Nare Daily  . furosemide  80 mg Intravenous Q12H  . ipratropium-albuterol  3 mL Nebulization Q6H  . isosorbide-hydrALAZINE  1 tablet Oral BID  . levothyroxine  137 mcg Oral QAC breakfast  . sacubitril-valsartan  1 tablet Oral BID  . sertraline  100 mg Oral Daily  . sodium chloride HYPERTONIC  4 mL Nebulization Daily  . spironolactone  25 mg Oral Daily  . traZODone  50 mg Oral QHS   Continuous Infusions:  PRN Meds: acetaminophen **OR** acetaminophen, albuterol, guaiFENesin-dextromethorphan, hydrALAZINE, nitroGLYCERIN, ondansetron **OR** ondansetron (ZOFRAN) IV, traMADol   Vital Signs    Vitals:   02/24/17 0538 02/24/17 1040 02/24/17 1134 02/24/17 1216  BP: (!) 156/56 (!) 121/51  (!) 141/61  Pulse: 88 (!) 51  (!) 56  Resp:    20  Temp: 97.8 F (36.6 C)   98.6 F (37 C)  TempSrc: Oral   Oral  SpO2: 100% 100% 100% 100%  Weight: 68.2 kg (150 lb 4.8 oz)     Height:        Intake/Output Summary (Last 24 hours) at 02/24/17 1302 Last data filed at 02/24/17 0805  Gross per 24 hour  Intake              720 ml  Output              600 ml  Net              120 ml   Filed Weights   02/22/17 1810 02/23/17 0415 02/24/17 0538  Weight: 70.1 kg (154 lb 9.6 oz) 68.9 kg (151 lb 12.8 oz) 68.2 kg (150 lb 4.8 oz)    Telemetry    Atrial flutter with V pacing - Personally Reviewed   Physical Exam   GEN: WD/WN No acute distress.   Neck: supple Cardiac: irregular Respiratory: Diminished BS GI: Soft, nontender, non-distended, no masses MS: No edema Neuro:  Grossly intact  Labs    Chemistry  Recent Labs Lab 02/22/17 1817 02/23/17 0708 02/24/17 0505  NA 136 139 135  K 3.6  3.2* 3.9  CL 101 105 103  CO2 23 23 23   GLUCOSE 125* 108* 114*  BUN 14 17 23*  CREATININE 1.37* 1.45* 1.68*  CALCIUM 9.3 8.8* 8.8*  GFRNONAA 38* 36* 30*  GFRAA 44* 42* 35*  ANIONGAP 12 11 9      Hematology  Recent Labs Lab 02/22/17 1817 02/23/17 0708 02/24/17 0505  WBC 6.2 5.4 5.4  RBC 4.36 3.97 4.04  HGB 12.5 11.1* 11.7*  HCT 38.3 34.5* 35.0*  MCV 87.8 86.9 86.6  MCH 28.7 28.0 29.0  MCHC 32.6 32.2 33.4  RDW 15.2 15.4 15.3  PLT 181 151 164    Cardiac Enzymes  Recent Labs Lab 02/22/17 1817 02/23/17 0104 02/23/17 0708 02/23/17 1226  TROPONINI 0.04* 0.05* 0.05* 0.04*     Recent Labs Lab 02/22/17 1845  TROPIPOC 0.03     BNP  Recent Labs Lab 02/22/17 2126  BNP 1,275.8*      Radiology    Dg Chest 2 View  Result Date: 02/22/2017 CLINICAL DATA:  Fluid overload. EXAM: CHEST  2 VIEW COMPARISON:  Chest x-ray dated Oct 01, 2015. FINDINGS: Stable cardiomegaly. Unchanged left chest wall AICD. Lower lobe predominant interstitial markings. No focal consolidation, pleural effusion, or pneumothorax. No acute osseous abnormality. IMPRESSION: Cardiomegaly and mild pulmonary vascular congestion, unchanged. Electronically Signed   By: Titus Dubin M.D.   On: 02/22/2017 18:55   Dg Chest Port 1 View  Result Date: 02/24/2017 CLINICAL DATA:  Productive cough for 1 week. Acute shortness of breath today. EXAM: PORTABLE CHEST 1 VIEW COMPARISON:  02/22/2017 and prior radiograph FINDINGS: Cardiomegaly and left pacemaker/ ICD again noted. Pulmonary vascular congestion and mild interstitial opacities/edema noted. No pleural effusion or pneumothorax. No airspace disease or acute bony abnormality is identified. IMPRESSION: Cardiomegaly with mild pulmonary vascular congestion and mild interstitial opacities which may represent mild interstitial edema. Electronically Signed   By: Margarette Canada M.D.   On: 02/24/2017 11:54    Patient Profile     71 y.o. female with a history of  nonischemic CM, HFrEF (45%), s/p Bi-ventricular pacer/ICD, obesity, OSA, AFL, and CKD p/w SOB. Patient found to be in recurrent atrial flutter. She was admitted for IV diuresis and ultimately cardioversion.   Assessment & Plan    Acute on chronic combined systolic/diastolic CHF - patient continues to have symptomatic dyspnea. She does not appear to be markedly volume overloaded on examination. Will continue present dose of Lasix but need to follow renal function closely. May need to reduce dose. I think her symptoms are likely secondary to recurrent atrial arrhythmias. We will therefore proceed with cardioversion on Monday. Patient does state this is how she felt when she was in atrial flutter previously. A follow-up echocardiogram.  NIDCM with chronic LBBB -  s/p BiV AICD - EF by echo 2017 45-50%; repeat study pending. - continue BB, Entresto, Bidil and spironolactone  HTN - BP now controlled. Continue present medications.   Atypical atrial flutter with remote DCCV 2016.  - continue coreg. She has been compliant with apixaban. Would try and arrange cardioversion for Monday as I think this is the likely explanation for her worsening symptoms.  Hypokalemia - improved  Acute on chronic stage III kidney disease -Follow renal function closely and reduce dose of Lasix if needed.    For questions or updates, please contact [place] Please consult [email or web address] for contact info under Cardiology/STEMI.      Signed, Kirk Ruths, MD  02/24/2017, 1:02 PM

## 2017-02-25 LAB — CBC WITH DIFFERENTIAL/PLATELET
BASOS PCT: 0 %
Basophils Absolute: 0 10*3/uL (ref 0.0–0.1)
EOS ABS: 0.1 10*3/uL (ref 0.0–0.7)
EOS PCT: 2 %
HCT: 36.3 % (ref 36.0–46.0)
HEMOGLOBIN: 11.9 g/dL — AB (ref 12.0–15.0)
LYMPHS ABS: 1.8 10*3/uL (ref 0.7–4.0)
Lymphocytes Relative: 39 %
MCH: 28.3 pg (ref 26.0–34.0)
MCHC: 32.8 g/dL (ref 30.0–36.0)
MCV: 86.2 fL (ref 78.0–100.0)
Monocytes Absolute: 0.3 10*3/uL (ref 0.1–1.0)
Monocytes Relative: 6 %
NEUTROS PCT: 53 %
Neutro Abs: 2.5 10*3/uL (ref 1.7–7.7)
PLATELETS: 161 10*3/uL (ref 150–400)
RBC: 4.21 MIL/uL (ref 3.87–5.11)
RDW: 15 % (ref 11.5–15.5)
WBC: 4.6 10*3/uL (ref 4.0–10.5)

## 2017-02-25 LAB — PROTIME-INR
INR: 1.51
PROTHROMBIN TIME: 18.1 s — AB (ref 11.4–15.2)

## 2017-02-25 LAB — BASIC METABOLIC PANEL
ANION GAP: 10 (ref 5–15)
ANION GAP: 8 (ref 5–15)
BUN: 27 mg/dL — ABNORMAL HIGH (ref 6–20)
BUN: 28 mg/dL — ABNORMAL HIGH (ref 6–20)
CALCIUM: 8.9 mg/dL (ref 8.9–10.3)
CO2: 22 mmol/L (ref 22–32)
CO2: 23 mmol/L (ref 22–32)
CREATININE: 1.84 mg/dL — AB (ref 0.44–1.00)
Calcium: 8.8 mg/dL — ABNORMAL LOW (ref 8.9–10.3)
Chloride: 101 mmol/L (ref 101–111)
Chloride: 101 mmol/L (ref 101–111)
Creatinine, Ser: 1.85 mg/dL — ABNORMAL HIGH (ref 0.44–1.00)
GFR calc Af Amer: 31 mL/min — ABNORMAL LOW (ref >60)
GFR, EST AFRICAN AMERICAN: 31 mL/min — AB (ref >60)
GFR, EST NON AFRICAN AMERICAN: 27 mL/min — AB (ref >60)
GFR, EST NON AFRICAN AMERICAN: 27 mL/min — AB (ref >60)
Glucose, Bld: 157 mg/dL — ABNORMAL HIGH (ref 65–99)
Glucose, Bld: 128 mg/dL — ABNORMAL HIGH (ref 65–99)
POTASSIUM: 3.8 mmol/L (ref 3.5–5.1)
Potassium: 3.7 mmol/L (ref 3.5–5.1)
SODIUM: 133 mmol/L — AB (ref 135–145)
SODIUM: 132 mmol/L — AB (ref 135–145)

## 2017-02-25 LAB — MAGNESIUM: Magnesium: 2 mg/dL (ref 1.7–2.4)

## 2017-02-25 MED ORDER — SODIUM CHLORIDE 0.9 % IV SOLN
250.0000 mL | INTRAVENOUS | Status: DC
  Administered 2017-02-26: 250 mL via INTRAVENOUS

## 2017-02-25 MED ORDER — SODIUM CHLORIDE 0.9% FLUSH
3.0000 mL | Freq: Two times a day (BID) | INTRAVENOUS | Status: DC
  Administered 2017-02-25 – 2017-03-01 (×6): 3 mL via INTRAVENOUS

## 2017-02-25 MED ORDER — SODIUM CHLORIDE 0.9% FLUSH: 3.0000 mL | INTRAVENOUS | Status: DC | PRN

## 2017-02-25 MED ORDER — HYDROCOD POLST-CPM POLST ER 10-8 MG/5ML PO SUER
5.0000 mL | Freq: Once | ORAL | Status: AC
  Administered 2017-02-25: 5 mL via ORAL
  Filled 2017-02-25: qty 5

## 2017-02-25 MED ORDER — SODIUM CHLORIDE 0.9% FLUSH
3.0000 mL | Freq: Two times a day (BID) | INTRAVENOUS | Status: DC
  Administered 2017-02-26 – 2017-03-01 (×6): 3 mL via INTRAVENOUS

## 2017-02-25 NOTE — Plan of Care (Signed)
Problem: Activity: Goal: Capacity to carry out activities will improve Outcome: Progressing Pt able to perform activity with less shortness of breath.

## 2017-02-25 NOTE — Progress Notes (Signed)
Progress Note  Patient Name: Morgan Clay Date of Encounter: 02/25/2017  Primary Cardiologist: Dr. Caryl Comes  Subjective   Without chest pain some sob  Inpatient Medications    Scheduled Meds: . apixaban  5 mg Oral BID  . carvedilol  25 mg Oral BID WC  . fluticasone  2 spray Each Nare Daily  . furosemide  80 mg Intravenous Q12H  . ipratropium-albuterol  3 mL Nebulization TID  . isosorbide-hydrALAZINE  1 tablet Oral BID  . levothyroxine  137 mcg Oral QAC breakfast  . sacubitril-valsartan  1 tablet Oral BID  . sertraline  100 mg Oral Daily  . sodium chloride HYPERTONIC  4 mL Nebulization Daily  . spironolactone  25 mg Oral Daily  . traZODone  50 mg Oral QHS   Continuous Infusions:  PRN Meds: acetaminophen **OR** acetaminophen, albuterol, guaiFENesin-dextromethorphan, hydrALAZINE, nitroGLYCERIN, ondansetron **OR** ondansetron (ZOFRAN) IV, traMADol   Vital Signs    Vitals:   02/24/17 1350 02/24/17 1954 02/24/17 2141 02/25/17 0503  BP:   (!) 135/55 (!) 130/51  Pulse:   (!) 56 (!) 51  Resp:   20 18  Temp:   98 F (36.7 C) 98.2 F (36.8 C)  TempSrc:   Oral Oral  SpO2: 98% 97% 97% 100%  Weight:    150 lb 1.6 oz (68.1 kg)  Height:        Intake/Output Summary (Last 24 hours) at 02/25/17 1056 Last data filed at 02/25/17 0919  Gross per 24 hour  Intake              960 ml  Output             1500 ml  Net             -540 ml   Filed Weights   02/23/17 0415 02/24/17 0538 02/25/17 0503  Weight: 151 lb 12.8 oz (68.9 kg) 150 lb 4.8 oz (68.2 kg) 150 lb 1.6 oz (68.1 kg)    Telemetry    Aflutter with V pacing Personally Reviewed   Physical Exam  Well developed and nourished in mild resp distress HENT normal Neck supple   Clear Regular rate and rhythm, no murmurs or gallops Abd-soft with active BS without hepatomegaly No Clubbing cyanosis edema Skin-warm and dry A & Oriented  Grossly normal sensory and motor function   Labs    Chemistry  Recent  Labs Lab 02/23/17 0708 02/24/17 0505 02/25/17 0701  NA 139 135 133*  K 3.2* 3.9 3.7  CL 105 103 101  CO2 23 23 22   GLUCOSE 108* 114* 157*  BUN 17 23* 27*  CREATININE 1.45* 1.68* 1.84*  CALCIUM 8.8* 8.8* 8.9  GFRNONAA 36* 30* 27*  GFRAA 42* 35* 31*  ANIONGAP 11 9 10      Hematology  Recent Labs Lab 02/23/17 0708 02/24/17 0505 02/25/17 0701  WBC 5.4 5.4 4.6  RBC 3.97 4.04 4.21  HGB 11.1* 11.7* 11.9*  HCT 34.5* 35.0* 36.3  MCV 86.9 86.6 86.2  MCH 28.0 29.0 28.3  MCHC 32.2 33.4 32.8  RDW 15.4 15.3 15.0  PLT 151 164 161    Cardiac Enzymes  Recent Labs Lab 02/22/17 1817 02/23/17 0104 02/23/17 0708 02/23/17 1226  TROPONINI 0.04* 0.05* 0.05* 0.04*     Recent Labs Lab 02/22/17 1845  TROPIPOC 0.03     BNP  Recent Labs Lab 02/22/17 2126  BNP 1,275.8*      Radiology    Dg Chest [facility] 1 View  Result  Date: 02/24/2017 CLINICAL DATA:  Productive cough for 1 week. Acute shortness of breath today. EXAM: PORTABLE CHEST 1 VIEW COMPARISON:  02/22/2017 and prior radiograph FINDINGS: Cardiomegaly and left pacemaker/ ICD again noted. Pulmonary vascular congestion and mild interstitial opacities/edema noted. No pleural effusion or pneumothorax. No airspace disease or acute bony abnormality is identified. IMPRESSION: Cardiomegaly with mild pulmonary vascular congestion and mild interstitial opacities which may represent mild interstitial edema. Electronically Signed   By: Margarette Canada M.D.   On: 02/24/2017 11:54    Patient Profile     71 y.o. female with a history of nonischemic CM, HFrEF (45%), s/p Bi-ventricular pacer/ICD, obesity, OSA, AFL, and CKD p/w SOB. Patient found to be in recurrent atrial flutter. She was admitted for IV diuresis and ultimately cardioversion.   Assessment & Plan    Acute on chronic combined systolic/diastolic CHF -   NIDCM with chronic LBBB -  s/p BiV AICD   HTN -    Atypical atrial flutter    Acute on chronic stage III kidney  disease   PErsistent atrial flutter  Will arrange DCCV for am With increasing Cr will back off on diuresis       For questions or updates, please contact [place] Please consult [email or web address] for contact info under Cardiology/STEMI.      Signed, Virl Axe, MD  02/25/2017, 10:56 AM

## 2017-02-25 NOTE — Progress Notes (Addendum)
PROGRESS NOTE  Morgan Clay ZYS:[id number] DOB: 1946/05/04 DOA: 02/22/2017 PCP: [name], DO  HPI/Recap of past 24 hours: Morgan Clay a 71 y.o.femalewith history of nonischemic cardiomyopathy with EF 45-50% (01/14/16), atrial flutter, status post AICD and biventricular pacemaker, hypothyroidism, chronic kidney disease stage 3, who was referred to the ER by patient's cardiologist due to a-flutter in the office and 10 lbs weight gain from acute fluid overload. Patient reports had AICD for 8 years and was replaced last year. Reports compliance to her meds.  Overnight, there were no acute events. Denies headache, dizziness, chest pain, dyspnea, or palpitation.  She continues to be diuresed and tolerating well.   Physical exam: General: Well developed in no acute distress. Eyes:Anicteric no pallor. ENMT:good hearing. No erythema or exudates. Neck:No JVD.No thyromegaly. Respiratory:Mild expiratory wheeze heard with crepitations. Cardiovascular:S1-S2 heard. RRR, no murmurs, rubs or gallops. Abdomen:Soft nontender bowel sounds present. Musculoskeletal:No LEedema. Skin:No rash. Neurologic:Alert awake oriented x4. Moves all extremities. Psychiatric:Normal mood and affect.   Objective: Vitals:   02/24/17 1954 02/24/17 2141 02/25/17 0503 02/25/17 1136  BP:  (!) 135/55 (!) 130/51 (!) 100/42  Pulse:  (!) 56 (!) 51 (!) 50  Resp:  20 18 20   Temp:  98 F (36.7 C) 98.2 F (36.8 C) 98.6 F (37 C)  TempSrc:  Oral Oral Oral  SpO2: 97% 97% 100% 96%  Weight:   68.1 kg (150 lb 1.6 oz)   Height:        Intake/Output Summary (Last 24 hours) at 02/25/17 1808 Last data filed at 02/25/17 1333  Gross per 24 hour  Intake              960 ml  Output              700 ml  Net              260 ml   Filed Weights   02/23/17 0415 02/24/17 0538 02/25/17 0503  Weight: 68.9 kg (151 lb 12.8 oz) 68.2 kg (150 lb 4.8 oz) 68.1 kg (150 lb 1.6 oz)     Data  Reviewed: CBC:  Recent Labs Lab 02/22/17 1817 02/23/17 0708 02/24/17 0505 02/25/17 0701  WBC 6.2 5.4 5.4 4.6  NEUTROABS  --   --  2.8 2.5  HGB 12.5 11.1* 11.7* 11.9*  HCT 38.3 34.5* 35.0* 36.3  MCV 87.8 86.9 86.6 86.2  PLT 181 151 164 932   Basic Metabolic Panel:  Recent Labs Lab 02/22/17 1817 02/23/17 0708 02/24/17 0505 02/25/17 0701 02/25/17 1154  NA 136 139 135 133* 132*  K 3.6 3.2* 3.9 3.7 3.8  CL 101 105 103 101 101  CO2 23 23 23 22 23   GLUCOSE 125* 108* 114* 157* 128*  BUN 14 17 23* 27* 28*  CREATININE 1.37* 1.45* 1.68* 1.84* 1.85*  CALCIUM 9.3 8.8* 8.8* 8.9 8.8*  MG  --   --   --  2.0  --    GFR: Estimated Creatinine Clearance: 24.1 mL/min (A) (by C-G formula based on SCr of 1.85 mg/dL (H)). Liver Function Tests: No results for input(s): AST, ALT, ALKPHOS, BILITOT, PROT, ALBUMIN in the last 168 hours. No results for input(s): LIPASE, AMYLASE in the last 168 hours. No results for input(s): AMMONIA in the last 168 hours. Coagulation Profile:  Recent Labs Lab 02/25/17 1154  INR 1.51   Cardiac Enzymes:  Recent Labs Lab 02/22/17 1817 02/23/17 0104 02/23/17 0708 02/23/17 1226  TROPONINI 0.04* 0.05* 0.05* 0.04*  BNP (last 3 results) No results for input(s): PROBNP in the last 8760 hours. HbA1C:  Recent Labs  02/24/17 0505  HGBA1C 6.0*   CBG: No results for input(s): GLUCAP in the last 168 hours. Lipid Profile: No results for input(s): CHOL, HDL, LDLCALC, TRIG, CHOLHDL, LDLDIRECT in the last 72 hours. Thyroid Function Tests:  Recent Labs  02/23/17 1226  TSH 1.189   Anemia Panel: No results for input(s): VITAMINB12, FOLATE, FERRITIN, TIBC, IRON, RETICCTPCT in the last 72 hours. Urine analysis:    Component Value Date/Time   COLORURINE YELLOW 10/02/2015 0330   APPEARANCEUR CLEAR 10/02/2015 0330   LABSPEC 1.016 10/02/2015 0330   PHURINE 5.5 10/02/2015 0330   GLUCOSEU NEGATIVE 10/02/2015 0330   HGBUR TRACE (A) 10/02/2015 0330    HGBUR negative 02/28/2008 0000   BILIRUBINUR NEGATIVE 10/02/2015 0330   KETONESUR NEGATIVE 10/02/2015 0330   PROTEINUR >300 (A) 10/02/2015 0330   UROBILINOGEN 1.0 07/19/2010 1752   NITRITE NEGATIVE 10/02/2015 0330   LEUKOCYTESUR NEGATIVE 10/02/2015 0330   Sepsis Labs: @LABRCNTIP (procalcitonin:4,lacticidven:4)  )No results found for this or any previous visit (from the past 240 hour(s)).    Studies: No results found.  Scheduled Meds: . apixaban  5 mg Oral BID  . carvedilol  25 mg Oral BID WC  . fluticasone  2 spray Each Nare Daily  . ipratropium-albuterol  3 mL Nebulization TID  . isosorbide-hydrALAZINE  1 tablet Oral BID  . levothyroxine  137 mcg Oral QAC breakfast  . sacubitril-valsartan  1 tablet Oral BID  . sertraline  100 mg Oral Daily  . sodium chloride flush  3 mL Intravenous Q12H  . sodium chloride flush  3 mL Intravenous Q12H  . sodium chloride HYPERTONIC  4 mL Nebulization Daily  . spironolactone  25 mg Oral Daily  . traZODone  50 mg Oral QHS    Continuous Infusions: . sodium chloride    . sodium chloride       LOS: 2 days   Assessment/Plan:  Atrial flutter in the setting of fluid overload -Cardiology following; plan for electrocardiovert once euvolemic -lasix IV 80 mg BID recommended by cardiology. -Monitor U/O, BP, and HR -Fluid and salt restriction  Acute on chronic systolic CHF (congestive heart failure) ([place]) -last 2D echo 01/14/16 EF 45-50% -Repeat echo 02/24/17 EF 45-50% -lasix 80mg  IV BID, BIDIL, entresto, aldactone,coreg -Stop lasix due to worsening AKI, now euvolemic  AKI on CKD stage 3 -Worsening cr 1.85 from 1.37 on admission -Stop lasix due to worsening AKI -Planned electrocardioversion in the morning  Hypothyroidism -TSH - levothyroxine  Biventricular ICD (implantable cardioverter-defibrillator) in place - cardiology following  Nonischemic cardiomyopathy EF 45-50 % (02/24/2017) -Cardiology following -management as  stated above  Essential hypertension -Management as stated above   Code Status:Full code  Family Communication:none  Disposition Plan:Will stay another midnight to plan for electrocardioversion possibly in the morning.   Consultants:  Cardiology  Procedures:  None  Antimicrobials:  Not indicated   DVT prophylaxis:Eliquis due to hx of A-flutter    Kayleen Memos, MD [facility] Pager [phone]  If 7PM-7AM, please contact night-coverage [email or web address] Password [email or web address] 02/25/2017, 6:08 PM

## 2017-02-25 NOTE — Progress Notes (Signed)
Patient staple throughout the night, only complaint is cough which has decreased since breathing treatments have been added.

## 2017-02-26 ENCOUNTER — Other Ambulatory Visit: Payer: Self-pay

## 2017-02-26 ENCOUNTER — Inpatient Hospital Stay (HOSPITAL_COMMUNITY): Payer: Medicare Other

## 2017-02-26 ENCOUNTER — Encounter (HOSPITAL_COMMUNITY)
Admission: EM | Disposition: A | Discharge status: Home / Self Care | Payer: Self-pay | Source: Home / Self Care | Attending: Internal Medicine

## 2017-02-26 DIAGNOSIS — R05 Cough: Secondary | ICD-10-CM

## 2017-02-26 DIAGNOSIS — R062 Wheezing: Secondary | ICD-10-CM

## 2017-02-26 DIAGNOSIS — I484 Atypical atrial flutter: Secondary | ICD-10-CM

## 2017-02-26 DIAGNOSIS — I5022 Chronic systolic (congestive) heart failure: Secondary | ICD-10-CM

## 2017-02-26 DIAGNOSIS — R059 Cough, unspecified: Secondary | ICD-10-CM

## 2017-02-26 HISTORY — PX: CARDIOVERSION: EP1203

## 2017-02-26 LAB — CBC WITH DIFFERENTIAL/PLATELET
Basophils Absolute: 0 10*3/uL (ref 0.0–0.1)
Basophils Relative: 0 %
EOS PCT: 2 %
Eosinophils Absolute: 0.1 10*3/uL (ref 0.0–0.7)
HEMATOCRIT: 36.9 % (ref 36.0–46.0)
Hemoglobin: 12.3 g/dL (ref 12.0–15.0)
LYMPHS ABS: 2.6 10*3/uL (ref 0.7–4.0)
LYMPHS PCT: 45 %
MCH: 28.9 pg (ref 26.0–34.0)
MCHC: 33.3 g/dL (ref 30.0–36.0)
MCV: 86.6 fL (ref 78.0–100.0)
MONO ABS: 0.4 10*3/uL (ref 0.1–1.0)
Monocytes Relative: 8 %
Neutro Abs: 2.5 10*3/uL (ref 1.7–7.7)
Neutrophils Relative %: 45 %
PLATELETS: 160 10*3/uL (ref 150–400)
RBC: 4.26 MIL/uL (ref 3.87–5.11)
RDW: 15 % (ref 11.5–15.5)
WBC: 5.6 10*3/uL (ref 4.0–10.5)

## 2017-02-26 LAB — BASIC METABOLIC PANEL
Anion gap: 11 (ref 5–15)
BUN: 31 mg/dL — AB (ref 6–20)
CHLORIDE: 98 mmol/L — AB (ref 101–111)
CO2: 21 mmol/L — ABNORMAL LOW (ref 22–32)
Calcium: 8.8 mg/dL — ABNORMAL LOW (ref 8.9–10.3)
Creatinine, Ser: 1.85 mg/dL — ABNORMAL HIGH (ref 0.44–1.00)
GFR calc Af Amer: 31 mL/min — ABNORMAL LOW (ref >60)
GFR calc non Af Amer: 27 mL/min — ABNORMAL LOW (ref >60)
GLUCOSE: 107 mg/dL — AB (ref 65–99)
POTASSIUM: 3.7 mmol/L (ref 3.5–5.1)
Sodium: 130 mmol/L — ABNORMAL LOW (ref 135–145)

## 2017-02-26 SURGERY — CARDIOVERSION (CATH LAB)
Anesthesia: LOCAL

## 2017-02-26 MED ORDER — PHYTONADIONE 5 MG PO TABS: 5.0000 mg | ORAL_TABLET | Freq: Once | ORAL | Status: DC

## 2017-02-26 MED ORDER — FUROSEMIDE 10 MG/ML IJ SOLN: 40.0000 mg | Freq: Every day | INTRAMUSCULAR | Status: DC

## 2017-02-26 MED ORDER — SODIUM CHLORIDE 0.9 % IV SOLN
INTRAVENOUS | Status: AC | PRN
  Administered 2017-02-26: 100 mL/h via INTRAVENOUS

## 2017-02-26 MED ORDER — MIDAZOLAM HCL 5 MG/5ML IJ SOLN
INTRAMUSCULAR | Status: AC
  Filled 2017-02-26: qty 5

## 2017-02-26 MED ORDER — FUROSEMIDE 10 MG/ML IJ SOLN
40.0000 mg | Freq: Two times a day (BID) | INTRAMUSCULAR | Status: DC
  Administered 2017-02-26: 40 mg via INTRAVENOUS
  Filled 2017-02-26: qty 4

## 2017-02-26 MED ORDER — FENTANYL CITRATE (PF) 100 MCG/2ML IJ SOLN
INTRAMUSCULAR | Status: AC
  Filled 2017-02-26: qty 2

## 2017-02-26 MED ORDER — FENTANYL CITRATE (PF) 100 MCG/2ML IJ SOLN
INTRAMUSCULAR | Status: DC | PRN
  Administered 2017-02-26 (×2): 25 ug via INTRAVENOUS
  Administered 2017-02-26 (×2): 12.5 ug via INTRAVENOUS

## 2017-02-26 MED ORDER — IPRATROPIUM-ALBUTEROL 0.5-2.5 (3) MG/3ML IN SOLN: 3.0000 mL | Freq: Four times a day (QID) | RESPIRATORY_TRACT | Status: DC | PRN

## 2017-02-26 MED ORDER — MIDAZOLAM HCL 5 MG/5ML IJ SOLN
INTRAMUSCULAR | Status: DC | PRN
  Administered 2017-02-26: 2 mg via INTRAVENOUS
  Administered 2017-02-26 (×2): 1 mg via INTRAVENOUS
  Administered 2017-02-26: 2 mg via INTRAVENOUS

## 2017-02-26 SURGICAL SUPPLY — 1 items: PAD DEFIB LIFELINK (PAD) ×2 IMPLANT

## 2017-02-26 NOTE — Progress Notes (Signed)
   02/26/17 1050  Mobility  Activity Ambulated in hall;Dangled on edge of bed  Range of Motion Active;All extremities  Level of Assistance Modified independent, requires aide device or extra time (Req'd extra time and multiple rest breaks. Slow than normal;)  Assistive Device None (Hand Rails and Furniture )  Distance Ambulated (ft) 360 ft  Mobility Response Tolerated poorly;RN notified (Pt HR in VTACH (60-135) c/o dizziness, SOB and exhaustion)  Bed Position Semi-fowlers

## 2017-02-26 NOTE — H&P (Signed)
EP Attending  Patient seen and examined. Her vitals, labs have been reviewed. On exam she is in atrial flutter with ventricular pacing. RRR, lungs are with minimal rales, no edema. Neuro is non-focal. Tele as above.   A/P 1. Atrial flutter - she has been therapeutically anti-coagulated. I have discussed the procedure (DCCV) with the patient and she wishes to proceed. Will use IV versed and fentanyl for sedation. 2. ICD - her device is pacing appropriately. Will recheck after the procedure.   Mikle Bosworth.D.

## 2017-02-26 NOTE — Care Management Important Message (Signed)
Important Message  Patient Details  Name: Morgan Clay MRN: [id number] Date of Birth: 07/10/1945   Medicare Important Message Given:  Yes    Orbie Pyo 02/26/2017, 12:53 PM

## 2017-02-26 NOTE — Progress Notes (Signed)
Progress Note  Patient Name: Morgan Clay Date of Encounter: 02/26/2017  Primary Cardiologist: Dr. Caryl Comes  Subjective   Still SOB, no chest pain, nebulizer yesterday caused her to cough continuously.   She thought she heard her ICD beep was stable on check in 02/22/17.   Inpatient Medications    Scheduled Meds: . apixaban  5 mg Oral BID  . carvedilol  25 mg Oral BID WC  . fluticasone  2 spray Each Nare Daily  . furosemide  40 mg Intravenous BID  . isosorbide-hydrALAZINE  1 tablet Oral BID  . levothyroxine  137 mcg Oral QAC breakfast  . sacubitril-valsartan  1 tablet Oral BID  . sertraline  100 mg Oral Daily  . sodium chloride flush  3 mL Intravenous Q12H  . sodium chloride flush  3 mL Intravenous Q12H  . sodium chloride HYPERTONIC  4 mL Nebulization Daily  . spironolactone  25 mg Oral Daily  . traZODone  50 mg Oral QHS   Continuous Infusions: . sodium chloride    . sodium chloride     PRN Meds: acetaminophen **OR** acetaminophen, guaiFENesin-dextromethorphan, hydrALAZINE, ipratropium-albuterol, nitroGLYCERIN, ondansetron **OR** ondansetron (ZOFRAN) IV, sodium chloride flush, sodium chloride flush, traMADol   Vital Signs    Vitals:   02/25/17 1954 02/25/17 2100 02/26/17 0524 02/26/17 0532  BP: (!) 144/68  (!) 118/48   Pulse: (!) 52 (!) 57 95 (!) 55  Resp: 18  18   Temp: 97.6 F (36.4 C)  97.8 F (36.6 C)   TempSrc: Oral  Oral   SpO2: 100%  100%   Weight:   151 lb 11.2 oz (68.8 kg)   Height:        Intake/Output Summary (Last 24 hours) at 02/26/17 1012 Last data filed at 02/26/17 0525  Gross per 24 hour  Intake              700 ml  Output              852 ml  Net             -152 ml   Filed Weights   02/24/17 0538 02/25/17 0503 02/26/17 0524  Weight: 150 lb 4.8 oz (68.2 kg) 150 lb 1.6 oz (68.1 kg) 151 lb 11.2 oz (68.8 kg)    Telemetry    A flutter with V pacing BiV - Personally Reviewed  ECG    No new - Personally Reviewed  Physical Exam    GEN: No acute distress.   Neck: Neck full   Cardiac:  Irreg irreg  , no murmurs, rubs, or gallops.  Respiratory: bilateral breath sounds to auscultation bilaterally.  + rhonchi wheezes GI: Soft, nontender, non-distended  MS: No edema; No deformity. Neuro:  Nonfocal  Psych: Normal affect   Labs    Chemistry Recent Labs Lab 02/25/17 0701 02/25/17 1154 02/26/17 0532  NA 133* 132* 130*  K 3.7 3.8 3.7  CL 101 101 98*  CO2 22 23 21*  GLUCOSE 157* 128* 107*  BUN 27* 28* 31*  CREATININE 1.84* 1.85* 1.85*  CALCIUM 8.9 8.8* 8.8*  GFRNONAA 27* 27* 27*  GFRAA 31* 31* 31*  ANIONGAP 10 8 11      Hematology Recent Labs Lab 02/24/17 0505 02/25/17 0701 02/26/17 0532  WBC 5.4 4.6 5.6  RBC 4.04 4.21 4.26  HGB 11.7* 11.9* 12.3  HCT 35.0* 36.3 36.9  MCV 86.6 86.2 86.6  MCH 29.0 28.3 28.9  MCHC 33.4 32.8 33.3  RDW 15.3 15.0  15.0  PLT 164 161 160    Cardiac Enzymes Recent Labs Lab 02/22/17 1817 02/23/17 0104 02/23/17 0708 02/23/17 1226  TROPONINI 0.04* 0.05* 0.05* 0.04*    Recent Labs Lab 02/22/17 1845  TROPIPOC 0.03     BNP Recent Labs Lab 02/22/17 2126  BNP 1,275.8*     DDimer No results for input(s): DDIMER in the last 168 hours.   Radiology    Dg Chest Port 1 View  Result Date: 02/26/2017 CLINICAL DATA:  Cough and worsening shortness of breath. EXAM: PORTABLE CHEST 1 VIEW COMPARISON:  Chest x-ray dated March 04, 2017. FINDINGS: Left chest wall AICD in unchanged position. Mild cardiomegaly, unchanged. Persistent mild pulmonary vascular congestion with slightly improved interstitial opacities. No focal consolidation, pleural effusion, or pneumothorax. No acute osseous abnormality. IMPRESSION: Stable cardiomegaly and pulmonary vascular congestion with slightly improved interstitial edema. Electronically Signed   By: Titus Dubin M.D.   On: 02/26/2017 09:42   Dg Chest Port 1 View  Result Date: 02/24/2017 CLINICAL DATA:  Productive cough for 1 week.  Acute shortness of breath today. EXAM: PORTABLE CHEST 1 VIEW COMPARISON:  02/22/2017 and prior radiograph FINDINGS: Cardiomegaly and left pacemaker/ ICD again noted. Pulmonary vascular congestion and mild interstitial opacities/edema noted. No pleural effusion or pneumothorax. No airspace disease or acute bony abnormality is identified. IMPRESSION: Cardiomegaly with mild pulmonary vascular congestion and mild interstitial opacities which may represent mild interstitial edema. Electronically Signed   By: Margarette Canada M.D.   On: 02/24/2017 11:54    Cardiac Studies   Echo 02/24/17 Study Conclusions  - Left ventricle: The cavity size was normal. There was moderate   concentric hypertrophy. Systolic function was mildly reduced. The   estimated ejection fraction was in the range of 45% to 50%. Wall   motion was normal; there were no regional wall motion   abnormalities. The study is not technically sufficient to allow   evaluation of LV diastolic function. Doppler parameters are   consistent with high ventricular filling pressure. - Aortic valve: Transvalvular velocity was within the normal range.   There was no stenosis. There was moderate regurgitation. - Mitral valve: Mildly calcified annulus. Mildly thickened leaflets   . Transvalvular velocity was within the normal range. There was   no evidence for stenosis. There was mild regurgitation. - Left atrium: The atrium was severely dilated. - Right ventricle: The cavity size was normal. Wall thickness was   normal. Systolic function was normal. - Tricuspid valve: There was severe regurgitation. - Pulmonary arteries: Systolic pressure was mildly increased. PA   peak pressure: 47 mm Hg (S).  Patient Profile     71 y.o. female with a history of nonischemic CM, HFrEF (45%), s/p Bi-ventricular pacer/ICD, obesity, OSA, AFL, and CKD p/w SOB. Patient found to be in recurrent atrial flutter. She was admitted for IV diuresis and ultimately  cardioversion.    Assessment & Plan    Acute on chronic combined systolic and diastolic HF still with JVD --IV Lasix 40 mg IV BID- with increasing cr. May need to hold second dose today --neg 172 since admit and wt today is same as admit.  Intake is large.  Will add 1500 cc intake restriction --Cr 1.37 on admit now 1.85 --CXR with slightly improved interstitial edema  NIDCM with chronic LBBB--has BiVICD --EF 2017 by echo 45-50%  Continues the same, mod LVH, PA pk pressure 47 mmHg -gen change was last year, she may have heard beep, if hears again will have  interrogated, but was checked 02/22/17 with 77 month longevity left.   Atypical a flutter-  Hx DCCV in 2016  --compliant with apixaban.   --plan for DCCV today -- continue BB  HTN --controlled today  CKD stage 3-4 hold lasix this PM    For questions or updates, please contact [place] Please consult [email or web address] for contact info under Cardiology/STEMI.  Pt seen and examined  I agree with findings as noted by L INgold above  Pt still in atrail flutter Still SOB   ON exam:  Neck full  Lungs with rhonchi, wheeze, decreased airflow  Cardiac IRreg irreg  No S3  Ext without edema   Plan on cardioversion today  Continue with IV lasix  HOld PM dose and check Cr in am   Dorris Carnes        Signed, Cecilie Kicks, NP  02/26/2017, 10:12 AM

## 2017-02-26 NOTE — Progress Notes (Signed)
Patient refused her 0800 duoneb and hyptertonic saline. Stated it made her feel worse. RT will continue to monitor for need of treatment.

## 2017-02-26 NOTE — CV Procedure (Signed)
EP Procedure Note  Preoperative diagnosis: atrial flutter, atypical  Postoperative diagnosis: atrial flutter, atypical  Procedure Performed: DCCV  Description of the procedure: after informed consent was obtained, the patient was prepped in the usual manner. The electrodispersive pads were placed in the AP position. The patient was sedated under my direct supervision with IV versed (6 mg) and fentanyl (75 mcg).  360 Joules of synchronized biphasic energy was delivered, restoring NSR. She tolerated the procedure well. The patient's ICD was reprogrammed to a lower rate of 60/min.   Mikle Bosworth.D.

## 2017-02-26 NOTE — Progress Notes (Addendum)
PROGRESS NOTE  Khristine Verno KDT:[id number] DOB: [birth year] DOA: 02/22/2017 PCP: [name], DO  HPI/Recap of past 24 hours: Morgan Clay a 71 y.o.femalewith history of nonischemic cardiomyopathy, HFREF 45-50% (02/24/17), atrial flutter, status post AICD and biventricular pacemaker, hypothyroidism, chronic kidney disease stage 3, who was referred to the ER by patient's cardiologist due to a-flutter in the office and 10 lbs weight gain from acute fluid overload. Patient reports had AICD for 8 years and was replaced last year. Reports compliance to her meds.  Overnight, the patient reports that the breathing treatment she received last night made her cough persistently.  She admits to productive cough with white sputum. Denies chest pain or palpitations. Possible electrical cardioversion today.  Physical exam: General: Well developed, well nourished appears in mild distress due to persistent cough. Eyes:Anicteric no pallor. ENMT:good hearing. No erythema or exudates. Neck: +JVD.No thyromegaly. Respiratory:Mild expiratory wheeze heard with crepitations at bases. Cardiovascular:S1-S2 heard. RRR, no murmurs, rubs or gallops. Abdomen:Soft nontender bowel sounds present. Musculoskeletal:No LEedema. Skin:No rash. Neurologic:Alert awake oriented x4. Moves all extremities. Psychiatric:Normal mood and affect.   Objective: Vitals:   02/25/17 1954 02/25/17 2100 02/26/17 0524 02/26/17 0532  BP: (!) 144/68  (!) 118/48   Pulse: (!) 52 (!) 57 95 (!) 55  Resp: 18  18   Temp: 97.6 F (36.4 C)  97.8 F (36.6 C)   TempSrc: Oral  Oral   SpO2: 100%  100%   Weight:   68.8 kg (151 lb 11.2 oz)   Height:        Intake/Output Summary (Last 24 hours) at 02/26/17 1015 Last data filed at 02/26/17 0525  Gross per 24 hour  Intake              700 ml  Output              852 ml  Net             -152 ml   Filed Weights   02/24/17 0538 02/25/17 0503 02/26/17 0524  Weight:  68.2 kg (150 lb 4.8 oz) 68.1 kg (150 lb 1.6 oz) 68.8 kg (151 lb 11.2 oz)     Data Reviewed: CBC:  Recent Labs Lab 02/22/17 1817 02/23/17 0708 02/24/17 0505 02/25/17 0701 02/26/17 0532  WBC 6.2 5.4 5.4 4.6 5.6  NEUTROABS  --   --  2.8 2.5 2.5  HGB 12.5 11.1* 11.7* 11.9* 12.3  HCT 38.3 34.5* 35.0* 36.3 36.9  MCV 87.8 86.9 86.6 86.2 86.6  PLT 181 151 164 161 998   Basic Metabolic Panel:  Recent Labs Lab 02/23/17 0708 02/24/17 0505 02/25/17 0701 02/25/17 1154 02/26/17 0532  NA 139 135 133* 132* 130*  K 3.2* 3.9 3.7 3.8 3.7  CL 105 103 101 101 98*  CO2 23 23 22 23  21*  GLUCOSE 108* 114* 157* 128* 107*  BUN 17 23* 27* 28* 31*  CREATININE 1.45* 1.68* 1.84* 1.85* 1.85*  CALCIUM 8.8* 8.8* 8.9 8.8* 8.8*  MG  --   --  2.0  --   --    GFR: Estimated Creatinine Clearance: 24.2 mL/min (A) (by C-G formula based on SCr of 1.85 mg/dL (H)). Liver Function Tests: No results for input(s): AST, ALT, ALKPHOS, BILITOT, PROT, ALBUMIN in the last 168 hours. No results for input(s): LIPASE, AMYLASE in the last 168 hours. No results for input(s): AMMONIA in the last 168 hours. Coagulation Profile:  Recent Labs Lab 02/25/17 1154  INR 1.51   Cardiac  Enzymes:  Recent Labs Lab 02/22/17 1817 02/23/17 0104 02/23/17 0708 02/23/17 1226  TROPONINI 0.04* 0.05* 0.05* 0.04*   BNP (last 3 results) No results for input(s): PROBNP in the last 8760 hours. HbA1C:  Recent Labs  02/24/17 0505  HGBA1C 6.0*   CBG: No results for input(s): GLUCAP in the last 168 hours. Lipid Profile: No results for input(s): CHOL, HDL, LDLCALC, TRIG, CHOLHDL, LDLDIRECT in the last 72 hours. Thyroid Function Tests:  Recent Labs  02/23/17 1226  TSH 1.189   Anemia Panel: No results for input(s): VITAMINB12, FOLATE, FERRITIN, TIBC, IRON, RETICCTPCT in the last 72 hours. Urine analysis:    Component Value Date/Time   COLORURINE YELLOW 10/02/2015 0330   APPEARANCEUR CLEAR 10/02/2015 0330   LABSPEC  1.016 10/02/2015 0330   PHURINE 5.5 10/02/2015 0330   GLUCOSEU NEGATIVE 10/02/2015 0330   HGBUR TRACE (A) 10/02/2015 0330   HGBUR negative 02/28/2008 0000   BILIRUBINUR NEGATIVE 10/02/2015 0330   KETONESUR NEGATIVE 10/02/2015 0330   PROTEINUR >300 (A) 10/02/2015 0330   UROBILINOGEN 1.0 07/19/2010 1752   NITRITE NEGATIVE 10/02/2015 0330   LEUKOCYTESUR NEGATIVE 10/02/2015 0330   Sepsis Labs: @LABRCNTIP (procalcitonin:4,lacticidven:4)  )No results found for this or any previous visit (from the past 240 hour(s)).    Studies: Dg Chest Port 1 View  Result Date: 02/26/2017 CLINICAL DATA:  Cough and worsening shortness of breath. EXAM: PORTABLE CHEST 1 VIEW COMPARISON:  Chest x-ray dated March 04, 2017. FINDINGS: Left chest wall AICD in unchanged position. Mild cardiomegaly, unchanged. Persistent mild pulmonary vascular congestion with slightly improved interstitial opacities. No focal consolidation, pleural effusion, or pneumothorax. No acute osseous abnormality. IMPRESSION: Stable cardiomegaly and pulmonary vascular congestion with slightly improved interstitial edema. Electronically Signed   By: Titus Dubin M.D.   On: 02/26/2017 09:42    Scheduled Meds: . apixaban  5 mg Oral BID  . carvedilol  25 mg Oral BID WC  . fluticasone  2 spray Each Nare Daily  . furosemide  40 mg Intravenous BID  . isosorbide-hydrALAZINE  1 tablet Oral BID  . levothyroxine  137 mcg Oral QAC breakfast  . sacubitril-valsartan  1 tablet Oral BID  . sertraline  100 mg Oral Daily  . sodium chloride flush  3 mL Intravenous Q12H  . sodium chloride flush  3 mL Intravenous Q12H  . sodium chloride HYPERTONIC  4 mL Nebulization Daily  . spironolactone  25 mg Oral Daily  . traZODone  50 mg Oral QHS    Continuous Infusions: . sodium chloride    . sodium chloride       LOS: 3 days   Assessment/Plan:  Atrial flutter in the setting of fluid overload -Cardiology following; plan for electrocardiovert  today -We appreciate cardiology input -lasix IV reduced from 80mg  BID to 40 mg IV BID then held off due to AKI -Monitor U/O, BP, and HR -Fluid and salt restriction  Acute on chronic systolic CHF (congestive heart failure) ([place]) -last 2Decho 01/14/16 EF 45-50%. Repeat echo 02/24/17 EF 45-50% -BIDIL, entresto, aldactone, coreg -Hold off lasix due to worsening AKI  AKI on CKD stage 3 -Worsening cr 1.85 from 1.37 on admission -Stop lasix due to worsening AKI -Planned electrocardioversion today  Cough -Most likely secondary to fluid overload in the setting of severely dialated atrium and HFREF 45-50% versus other -Rule out infective process -Chest x-ray done 02/26/17: improved interstitial edema and no sign of infiltrates as reviewed by self. -No leukocytosis-afebrile -flonase, robitussin pm, duonebs prn, hypersaline nebs prn  Hyponatremia -Sodium 130; repeat BMP in the morning -Most like secondary to diuretic use -Continue to hold off Lasix due to AKI  Hypothyroidism -TSH - levothyroxine  Biventricular ICD (implantable cardioverter-defibrillator) in place - cardiology following  Nonischemic cardiomyopathy EF 45-50 % (02/24/2017) -Cardiology following -management as stated above  Essential hypertension -Management as stated above  Chronic depression/anxiety -stable -Zoloft, trazodone   Code Status:Full code  Family Communication:none  Disposition Plan:Will stay another midnight to plan for electrocardioversion today   Consultants:  Cardiology/electrocardiology  Procedures:  Planned electrocardioversion today  Antimicrobials:  Not indicated   DVT prophylaxis:Eliquis due to hx of A-flutter   Kayleen Memos, MD [facility] Pager [phone]  If 7PM-7AM, please contact night-coverage [email or web address] Password [email or web address] 02/26/2017, 10:15 AM  \

## 2017-02-26 NOTE — Telephone Encounter (Signed)
Called patient's daughter back. She does not need to speak to anyone at the office. She stated that she already talked to someone at the hospital.

## 2017-02-27 ENCOUNTER — Encounter (HOSPITAL_COMMUNITY): Payer: Self-pay | Admitting: Internal Medicine

## 2017-02-27 DIAGNOSIS — I5023 Acute on chronic systolic (congestive) heart failure: Secondary | ICD-10-CM

## 2017-02-27 LAB — PROCALCITONIN: Procalcitonin: <0.1 ng/mL

## 2017-02-27 LAB — BASIC METABOLIC PANEL
Anion gap: 10 (ref 5–15)
BUN: 33 mg/dL — ABNORMAL HIGH (ref 6–20)
CHLORIDE: 103 mmol/L (ref 101–111)
CO2: 22 mmol/L (ref 22–32)
Calcium: 8.9 mg/dL (ref 8.9–10.3)
Creatinine, Ser: 1.69 mg/dL — ABNORMAL HIGH (ref 0.44–1.00)
GFR calc Af Amer: 35 mL/min — ABNORMAL LOW (ref >60)
GFR, EST NON AFRICAN AMERICAN: 30 mL/min — AB (ref >60)
GLUCOSE: 92 mg/dL (ref 65–99)
POTASSIUM: 3.6 mmol/L (ref 3.5–5.1)
Sodium: 135 mmol/L (ref 135–145)

## 2017-02-27 MED ORDER — FUROSEMIDE 40 MG PO TABS
40.0000 mg | ORAL_TABLET | Freq: Two times a day (BID) | ORAL | Status: DC
  Administered 2017-02-27: 40 mg via ORAL
  Filled 2017-02-27: qty 1

## 2017-02-27 MED ORDER — METHYLPREDNISOLONE SODIUM SUCC 40 MG IJ SOLR
40.0000 mg | Freq: Two times a day (BID) | INTRAMUSCULAR | Status: DC
  Administered 2017-02-27: 40 mg via INTRAVENOUS
  Filled 2017-02-27: qty 1

## 2017-02-27 MED ORDER — METHYLPREDNISOLONE SODIUM SUCC 40 MG IJ SOLR: 40.0000 mg | Freq: Every day | INTRAMUSCULAR | Status: DC

## 2017-02-27 MED ORDER — FUROSEMIDE 10 MG/ML IJ SOLN
80.0000 mg | Freq: Once | INTRAMUSCULAR | Status: AC
  Administered 2017-02-27: 80 mg via INTRAVENOUS
  Filled 2017-02-27: qty 8

## 2017-02-27 NOTE — Progress Notes (Signed)
PROGRESS NOTE  Morgan Clay QVZ:[id number] DOB: 13-Feb-1946 DOA: 02/22/2017 PCP: [name], DO  HPI/Recap of past 24 hours: This morning, the patient reports persistent productive cough. She has declined duonebs and hypersaline nebs stating that they make her cough. Wheezing noted on physical exam. IV solumedrol started.   Pulmonology consulted and will evaluate the patient. She reports remote hx of OSA more than 10 years ago but no use of CPAP. CXR 02/26/17 improved interstitial edema. On IV lasix. Electrocardioversion done yesterday and tolerated well. Cardiology signing off, will follow up as outpt.  Physical exam: General: Well developed, well nourished appears in mild distress due to persistent cough. Eyes:Anicteric no pallor. ENMT:good hearing. No erythema or exudates. Neck: +JVD.No thyromegaly. Respiratory:Expiratory wheeze heard with crepitations at bases. Cardiovascular:S1-S2 heard. RRR, no murmurs, rubs or gallops. Abdomen:Soft nontender bowel sounds present. Musculoskeletal:No LEedema. Skin:No rash. Neurologic:Alert awake oriented x4. Moves all extremities. Psychiatric:Normal mood and affect.   Objective: Vitals:   02/26/17 1744 02/26/17 1802 02/26/17 2125 02/27/17 1139  BP: (!) 159/65 (!) 150/54 (!) 152/61 (!) 113/46  Pulse: 68 (!) 59 65 (!) 59  Resp: 15  18 18   Temp:   97.9 F (36.6 C) 98.2 F (36.8 C)  TempSrc:   Oral Oral  SpO2: 100%  100% 96%  Weight:      Height:        Intake/Output Summary (Last 24 hours) at 02/27/17 1454 Last data filed at 02/27/17 1334  Gross per 24 hour  Intake           483.64 ml  Output             1000 ml  Net          -516.36 ml   Filed Weights   02/24/17 0538 02/25/17 0503 02/26/17 0524  Weight: 68.2 kg (150 lb 4.8 oz) 68.1 kg (150 lb 1.6 oz) 68.8 kg (151 lb 11.2 oz)    Data Reviewed: CBC:  Recent Labs Lab 02/22/17 1817 02/23/17 0708 02/24/17 0505 02/25/17 0701 02/26/17 0532  WBC 6.2 5.4 5.4  4.6 5.6  NEUTROABS  --   --  2.8 2.5 2.5  HGB 12.5 11.1* 11.7* 11.9* 12.3  HCT 38.3 34.5* 35.0* 36.3 36.9  MCV 87.8 86.9 86.6 86.2 86.6  PLT 181 151 164 161 329   Basic Metabolic Panel:  Recent Labs Lab 02/24/17 0505 02/25/17 0701 02/25/17 1154 02/26/17 0532 02/27/17 0514  NA 135 133* 132* 130* 135  K 3.9 3.7 3.8 3.7 3.6  CL 103 101 101 98* 103  CO2 23 22 23  21* 22  GLUCOSE 114* 157* 128* 107* 92  BUN 23* 27* 28* 31* 33*  CREATININE 1.68* 1.84* 1.85* 1.85* 1.69*  CALCIUM 8.8* 8.9 8.8* 8.8* 8.9  MG  --  2.0  --   --   --    GFR: Estimated Creatinine Clearance: 26.5 mL/min (A) (by C-G formula based on SCr of 1.69 mg/dL (H)). Liver Function Tests: No results for input(s): AST, ALT, ALKPHOS, BILITOT, PROT, ALBUMIN in the last 168 hours. No results for input(s): LIPASE, AMYLASE in the last 168 hours. No results for input(s): AMMONIA in the last 168 hours. Coagulation Profile:  Recent Labs Lab 02/25/17 1154  INR 1.51   Cardiac Enzymes:  Recent Labs Lab 02/22/17 1817 02/23/17 0104 02/23/17 0708 02/23/17 1226  TROPONINI 0.04* 0.05* 0.05* 0.04*   BNP (last 3 results) No results for input(s): PROBNP in the last 8760 hours. HbA1C: No results for  input(s): HGBA1C in the last 72 hours. CBG: No results for input(s): GLUCAP in the last 168 hours. Lipid Profile: No results for input(s): CHOL, HDL, LDLCALC, TRIG, CHOLHDL, LDLDIRECT in the last 72 hours. Thyroid Function Tests: No results for input(s): TSH, T4TOTAL, FREET4, T3FREE, THYROIDAB in the last 72 hours. Anemia Panel: No results for input(s): VITAMINB12, FOLATE, FERRITIN, TIBC, IRON, RETICCTPCT in the last 72 hours. Urine analysis:    Component Value Date/Time   COLORURINE YELLOW 10/02/2015 0330   APPEARANCEUR CLEAR 10/02/2015 0330   LABSPEC 1.016 10/02/2015 0330   PHURINE 5.5 10/02/2015 0330   GLUCOSEU NEGATIVE 10/02/2015 0330   HGBUR TRACE (A) 10/02/2015 0330   HGBUR negative 02/28/2008 0000    BILIRUBINUR NEGATIVE 10/02/2015 0330   KETONESUR NEGATIVE 10/02/2015 0330   PROTEINUR >300 (A) 10/02/2015 0330   UROBILINOGEN 1.0 07/19/2010 1752   NITRITE NEGATIVE 10/02/2015 0330   LEUKOCYTESUR NEGATIVE 10/02/2015 0330   Sepsis Labs: @LABRCNTIP (procalcitonin:4,lacticidven:4)  )No results found for this or any previous visit (from the past 240 hour(s)).    Studies: No results found.  Scheduled Meds: . apixaban  5 mg Oral BID  . carvedilol  25 mg Oral BID WC  . fluticasone  2 spray Each Nare Daily  . isosorbide-hydrALAZINE  1 tablet Oral BID  . levothyroxine  137 mcg Oral QAC breakfast  . methylPREDNISolone (SOLU-MEDROL) injection  40 mg Intravenous Q12H  . sacubitril-valsartan  1 tablet Oral BID  . sertraline  100 mg Oral Daily  . sodium chloride flush  3 mL Intravenous Q12H  . sodium chloride flush  3 mL Intravenous Q12H  . spironolactone  25 mg Oral Daily  . traZODone  50 mg Oral QHS    Continuous Infusions: . sodium chloride 250 mL (02/26/17 1000)  . sodium chloride 250 mL (02/26/17 1000)     LOS: 4 days   Assessment/Plan:  Atrial flutter s/p electrocardioversion POD#1 -Cardio signing off, will follow up as outpt -Monitor U/O, BP, and HR -Fluid and salt restriction -Resume po lasix  Acute on chronic systolic CHF (congestive heart failure) ([place]) -last 2Decho 01/14/16 EF 45-50%. Repeat echo 02/24/17 EF 45-50% -BIDIL, entresto, aldactone, coreg -Resume po lasix  AKI on CKD stage 3 -Improving, cr 1.69 from 1.85 -avoid nephrotoxic agents -resume po lasix  Cough/wheezes -Most likely multifactorial in the setting of severely dialated atrium and HFREF 45-50% versus others -Iv solumedrol 40 mg BID started -Rule out infective process, chest x-ray 02/26/17: improved interstitial edema and no sign of infiltrates as reviewed by self. -No leukocytosis-afebrile -flonase, robitussin pm, duonebs prn, hypersaline nebs prn -pt refuses duonebs and hypersaline  nebs  Hyponatremia -resolved -Bmp in am  Hypothyroidism -TSH 1.1 -levothyroxine  Biventricular ICD (implantable cardioverter-defibrillator) in place - cardiology, will follow up as outpt  Nonischemic cardiomyopathy EF 45-50 % (02/24/2017) -Cardiology will follow up as outpt -management as stated above  Essential hypertension -Management as stated above  Chronic depression/anxiety -stable -Zoloft, trazodone  Reported hx OSA -pulmonology consulted, we appreciate recommendations   Code Status:Full code  Family Communication:none  Disposition Plan:Will stay another midnight to improve upper respiratory symptoms.   Consultants:  Cardiology/electrocardiology, pulmonary  Procedures:  Electrocardioversion 02/26/17  Antimicrobials:  Not indicated at this time   DVT prophylaxis:Eliquis for A-flutter    Kayleen Memos, MD [facility] Pager [phone]  If 7PM-7AM, please contact night-coverage [email or web address] Password [email or web address] 02/27/2017, 2:54 PM

## 2017-02-27 NOTE — Progress Notes (Signed)
Pt slept well during the night, Vitals stable, no any sign of SOB and distress noted, no any complain of pain, Pt maintained in NSR, V-paced, will continue to monitor the patient.

## 2017-02-27 NOTE — Consult Note (Signed)
Name: Morgan Clay MRN: [id number] DOB: 07-27-1945    ADMISSION DATE:  02/22/2017 CONSULTATION DATE:  02/27/17  REFERRING MD :  N[place]ada Crane  CHIEF COMPLAINT:  SOB  BRIEF PATIENT DESCRIPTION: 71 y.o. F with hx multiple cardiac problems, admitted 10/18 with SOB due to CHF exacerbation.  Has been getting diuresed with increased doses of lasix.  PCCM asked to see 10/23 for wheezing.  SIGNIFICANT EVENTS  10/18 > admit. 10/23 > PCCM consult.  STUDIES:  CXR 10/20 > vascular congestion.   HISTORY OF PRESENT ILLNESS:  Morgan Clay is a 72 y.o. F with multiple co-morbidites as outlined below; most notably cardiac.  She was admitted on 10/19 with SOB and hypertension.  She had reported a 10-20 pound weight gain over the 2 weeks prior.  Also had worsening dyspnea on exertion along with increasing lower extremity edema.    In ED, she was found to be volume overloaded with CHF exacerbation.  She was admitted by [facility] and was seen in consultation by cardiology.  She was started on Lasix.  Since admission, she is -1.1 L. On AM 10/23, she had persistent dyspnea with wheezing per report.  She was seen by cardiology who felt that she remained volume overloaded; therefore, they had ordered an additional 80 mg of Lasix.  PCCM was also called that afternoon to see in consultation.  Exam, patient is asymptomatic.  She is resting comfortably in bed.  She is currently on room air with stable vitals.  She denies any dyspnea, chest pain, fevers, sweats.  She has not been around anyone that has been sick.  She does have chills as well as a cough productive of clear to white colored sputum.  She tells me that she had 10-20 pounds weight gain 2 weeks prior to admission along with DOE, LE edema.  She states that she was compliant with her Lasix as outpatient; however, since admission, her Lasix has been increased and she feels somewhat better. She denies any history of tobacco use.  Has not had significant  secondhand smoke exposure.  Has never had PFTs and has never been told that she has COPD, asthma, or other chronic lung problem. She does carry a diagnosis of OSA; reportedly diagnosed roughly 8 years ago after sleep study.  She states that she wore CPAP for roughly only 1 year but then stopped.  She feels that she was told to stop by a provider but is unable to recall who this was.  PAST MEDICAL HISTORY :   has a past medical history of AICD (automatic cardioverter/defibrillator) present; Arthritis; Asthma; Atypical atrial flutter ([place]) (09/28/2015); Biventricular implantable cardiac defibrillator in situ (12/24/2006); Chronic systolic congestive heart failure ([place]); DEPRESSION (12/17/2009); FIBROIDS, UTERUS (03/05/2008); Glaucoma; Hepatitis C (01/04/2007); HEPATITIS C - s/p treatment with Harvoni, seeing hepatology, Dawn Drazek  (01/04/2007); cardiovascular stress test; echocardiogram; Hyperlipemia (12/06/2012); HYPERTENSION, BENIGN (04/24/2007); Hypothyroidism; LBBB (left bundle branch block); Nonischemic cardiomyopathy ([place]); OBESITY (05/27/2009); OBSTRUCTIVE SLEEP APNEA (11/14/2007); OSTEOPENIA (09/30/2008); and Personal history of other infectious and parasitic disease.  has a past surgical history that includes appendectomy; Cardiac catheterization; V45.02; Insert / replace / remove pacemaker; Cardioversion (N/A, 12/14/2014); Cardiac catheterization (N/A, 10/06/2015); and CARDIOVERSION (N/A, 02/26/2017). Prior to Admission medications   Medication Sig Start Date End Date Taking? Authorizing Provider  albuterol (PROVENTIL HFA;VENTOLIN HFA) 108 (90 BASE) MCG/ACT inhaler Inhale 2 puffs into the lungs every 6 (six) hours as needed for wheezing or shortness of breath. 01/01/15  Yes Colin Benton R, DO  apixaban (  ELIQUIS) 5 MG TABS tablet Take 1 tablet (5 mg total) by mouth 2 (two) times daily. 04/20/16  Yes [name], MD  BIDIL 20-37.5 MG tablet TAKE ONE TABLET BY MOUTH TWICE DAILY 02/12/17  Yes [name], MD   carvedilol (COREG) 25 MG tablet Take 1 tablet (25 mg total) by mouth 2 (two) times daily with a meal. 05/21/13  Yes Wellington Hampshire, MD  ENTRESTO 97-103 MG TAKE ONE TABLET BY MOUTH TWICE DAILY **TITRATED  DOSE  UP** 02/12/17  Yes [name], MD  fluticasone [facility]) 50 MCG/ACT nasal spray Place 2 sprays into both nostrils daily. 01/24/17  Yes Nafziger, Tommi Rumps, NP  furosemide (LASIX) 40 MG tablet Take 1 tablet (40 mg total) by mouth 2 (two) times daily.  May take additional 40 mg tablet as needed for fluid symptoms. 04/13/16  Yes [name], MD  levothyroxine (SYNTHROID, LEVOTHROID) 137 MCG tablet TAKE 1 TABLET BY MOUTH ONCE DAILY BEFORE BREAKFAST 02/09/17  Yes [name], DO  nitroGLYCERIN (NITROSTAT) 0.4 MG SL tablet Place 0.4 mg under the tongue every 5 (five) minutes as needed for chest pain.   Yes [provider]  sertraline (ZOLOFT) 100 MG tablet Take 1 tablet (100 mg total) by mouth daily. 06/30/14  Yes [name], DO  traMADol (ULTRAM) 50 MG tablet 1/2 to 1 tablet as needed for severe pain not more then twice daily. Patient taking differently: Take 25-50 mg by mouth every 12 (twelve) hours as needed for moderate pain.  08/30/15  Yes [name], DO  traZODone (DESYREL) 50 MG tablet Take 1 tablet (50 mg total) by mouth at bedtime. 06/30/15  Yes Colin Benton R, DO   No Known Allergies  FAMILY HISTORY:  family history includes Asthma in her father and sister; Cancer in her sister; Diabetes in her unknown relative; Heart attack in her father and mother; Stroke in her brother. SOCIAL HISTORY:  reports that she has never smoked. She has never used smokeless tobacco. She reports that she drinks alcohol. She reports that she does not use drugs.  REVIEW OF SYSTEMS:   All negative; except for those that are bolded, which indicate positives.  Constitutional: weight loss, weight gain, night sweats, fevers, chills, fatigue, weakness.  HEENT: headaches, sore throat, sneezing,  nasal congestion, post nasal drip, difficulty swallowing, tooth/dental problems, visual complaints, visual changes, ear aches. Neuro: difficulty with speech, weakness, numbness, ataxia. CV:  chest pain, orthopnea, PND, swelling in lower extremities, dizziness, palpitations, syncope.  Resp: cough, hemoptysis, dyspnea, wheezing. GI: heartburn, indigestion, abdominal pain, nausea, vomiting, diarrhea, constipation, change in bowel habits, loss of appetite, hematemesis, melena, hematochezia.  GU: dysuria, change in color of urine, urgency or frequency, flank pain, hematuria. MSK: joint pain or swelling, decreased range of motion. Psych: change in mood or affect, depression, anxiety, suicidal ideations, homicidal ideations. Skin: rash, itching, bruising.  SUBJECTIVE:  Breathing improved.  Denies dyspnea at this time.  No wheeze.  VITAL SIGNS: Temp:  [97.9 F (36.6 C)-98.2 F (36.8 C)] 98.2 F (36.8 C) (10/23 1139) Pulse Rate:  [42-68] 59 (10/23 1139) Resp:  [15-52] 18 (10/23 1139) BP: (113-178)/(46-74) 113/46 (10/23 1139) SpO2:  [0 %-100 %] 96 % (10/23 1139)  PHYSICAL EXAMINATION: General:  Adult female, in NAD. Neuro:  A&O x 3, no deficits. HEENT:  [place] / AT. MMM. Cardiovascular:  RRR, no M/R/G. Lungs:  CTAB. Abdomen:  BS x 4.  S / NT / ND. Musculoskeletal:  No deformities, 1+  edema. Skin:  Warm, dry.   Recent Labs Lab 02/25/17 1154 02/26/17 0532 02/27/17 0514  NA 132* 130* 135  K 3.8 3.7 3.6  CL 101 98* 103  CO2 23 21* 22  BUN 28* 31* 33*  CREATININE 1.85* 1.85* 1.69*  GLUCOSE 128* 107* 92    Recent Labs Lab 02/24/17 0505 02/25/17 0701 02/26/17 0532  HGB 11.7* 11.9* 12.3  HCT 35.0* 36.3 36.9  WBC 5.4 4.6 5.6  PLT 164 161 160   Dg Chest Port 1 View  Result Date: 02/26/2017 CLINICAL DATA:  Cough and worsening shortness of breath. EXAM: PORTABLE CHEST 1 VIEW COMPARISON:  Chest x-ray dated March 04, 2017. FINDINGS: Left chest wall AICD in unchanged position. Mild  cardiomegaly, unchanged. Persistent mild pulmonary vascular congestion with slightly improved interstitial opacities. No focal consolidation, pleural effusion, or pneumothorax. No acute osseous abnormality. IMPRESSION: Stable cardiomegaly and pulmonary vascular congestion with slightly improved interstitial edema. Electronically Signed   By: Titus Dubin M.D.   On: 02/26/2017 09:42    ASSESSMENT / PLAN:  Pulmonary edema secondary to Acute on chronic systolic CHF (LVEF 86%): COPD doubtful in this never smoker - Cardiology following - Continued diuresis under their direction. (given 80mg  IV lasix once this afternoon) - Strict I&O - No role for bronchodilators, steroids, antibiotics.   Atrial flutter - s/p synchronized cardioversion 10/22 with restoration of sinus rhythm. - management per cardiology - anticoagulation with eliquis  OSA - was wearing CPAP for one year, stopped about 7 years ago.  - Likely she needs to restart CPAP QHS - Will need outpatient polysomnogram to determine this for sure.   CKD - baseline serum creatinine 1.3 - careful monitoring of renal function with diuresis  Will start nocturnal CPAP now.  Please arrange for outpatient sleep study after discharge and she can follow up with sleep medicine at [facility]. Rest per primary team.  Nothing further to add.  PCCM will sign off.  Please do not hesitate to call us back if we can be of any further assistance.   Montey Hora, [facility] Pager: [phone]  or [phone] 02/27/2017, 3:33 PM   STAFF NOTE: I, Merrie Roof, MD FACP have personally reviewed patient's available data, including medical history, events of note, physical examination and test results as part of my evaluation. I have discussed with resident/NP and other care providers such as pharmacist, RN and RRT. In addition, I personally evaluated patient and elicited key findings of: awake,  alert, no distress, jvd present, no stridor, min wheeze end inspiration associated with wet crackles, crackles lungs coarse , abdo soft, min edema but some present, pcxr I reviewed shows improved pulm edema, I am unimpressed foro active bronchospasm at all, I think her end insp sounds are pulm edema related, continued lasix, was neg almost 1 liter last 24 hours and crt resolving with this approach, would maintain neg balance further and likely symptoms will continued to resolve, I do not see role steroids IV, she has some likely Atrovent induced cough and would avoid scheduled bronchodilators, if she has true wheezing would use albuteral or xopenex oonly , but seems the Atrovent irriated her upper airway, pcxr in am , lastly , she has osa and noncompliance, would re add cpap while in house and consider 10/5 or 8/4 for now and will need outpt sleep study again, I updated pt in full in room, will re visit in  am   Odon Titus Mould, MD, [place] Pgr: [facility] 02/27/2017 3:50 PM

## 2017-02-27 NOTE — Progress Notes (Signed)
Progress Note  Patient Name: Morgan Clay Date of Encounter: 02/27/2017  Primary Cardiologist: Caryl Comes  Subjective   Breathing is still short  No CP    Inpatient Medications    Scheduled Meds: . apixaban  5 mg Oral BID  . carvedilol  25 mg Oral BID WC  . fluticasone  2 spray Each Nare Daily  . isosorbide-hydrALAZINE  1 tablet Oral BID  . levothyroxine  137 mcg Oral QAC breakfast  . sacubitril-valsartan  1 tablet Oral BID  . sertraline  100 mg Oral Daily  . sodium chloride flush  3 mL Intravenous Q12H  . sodium chloride flush  3 mL Intravenous Q12H  . sodium chloride HYPERTONIC  4 mL Nebulization Daily  . spironolactone  25 mg Oral Daily  . traZODone  50 mg Oral QHS   Continuous Infusions: . sodium chloride 250 mL (02/26/17 1000)  . sodium chloride 250 mL (02/26/17 1000)   PRN Meds: acetaminophen **OR** acetaminophen, guaiFENesin-dextromethorphan, hydrALAZINE, ipratropium-albuterol, nitroGLYCERIN, ondansetron **OR** ondansetron (ZOFRAN) IV, sodium chloride flush, sodium chloride flush, traMADol   Vital Signs    Vitals:   02/26/17 1739 02/26/17 1744 02/26/17 1802 02/26/17 2125  BP: 135/74 (!) 159/65 (!) 150/54 (!) 152/61  Pulse: 63 68 (!) 59 65  Resp: 17 15  18   Temp:    97.9 F (36.6 C)  TempSrc:    Oral  SpO2: 99% 100%  100%  Weight:      Height:        Intake/Output Summary (Last 24 hours) at 02/27/17 0745 Last data filed at 02/27/17 0700  Gross per 24 hour  Intake           120.64 ml  Output             1400 ml  Net         -1279.36 ml   Filed Weights   02/24/17 0538 02/25/17 0503 02/26/17 0524  Weight: 150 lb 4.8 oz (68.2 kg) 150 lb 1.6 oz (68.1 kg) 151 lb 11.2 oz (68.8 kg)    Telemetry    SR  Paced   - Personally Reviewed  ECG     Physical Exam   GEN: No acute distress.   Neck: JVP is increased  Cardiac: RRR, no murmurs, rubs, or gallops.  Respiratory: Wheezes and pops bilaterally   GI: Soft, nontender, non-distended  MS: No edema;  No deformity. Neuro:  Nonfocal  Psych: Normal affect   Labs    Chemistry Recent Labs Lab 02/25/17 1154 02/26/17 0532 02/27/17 0514  NA 132* 130* 135  K 3.8 3.7 3.6  CL 101 98* 103  CO2 23 21* 22  GLUCOSE 128* 107* 92  BUN 28* 31* 33*  CREATININE 1.85* 1.85* 1.69*  CALCIUM 8.8* 8.8* 8.9  GFRNONAA 27* 27* 30*  GFRAA 31* 31* 35*  ANIONGAP 8 11 10      Hematology Recent Labs Lab 02/24/17 0505 02/25/17 0701 02/26/17 0532  WBC 5.4 4.6 5.6  RBC 4.04 4.21 4.26  HGB 11.7* 11.9* 12.3  HCT 35.0* 36.3 36.9  MCV 86.6 86.2 86.6  MCH 29.0 28.3 28.9  MCHC 33.4 32.8 33.3  RDW 15.3 15.0 15.0  PLT 164 161 160    Cardiac Enzymes Recent Labs Lab 02/22/17 1817 02/23/17 0104 02/23/17 0708 02/23/17 1226  TROPONINI 0.04* 0.05* 0.05* 0.04*    Recent Labs Lab 02/22/17 1845  TROPIPOC 0.03     BNP Recent Labs Lab 02/22/17 2126  BNP 1,275.8*  DDimer No results for input(s): DDIMER in the last 168 hours.   Radiology    Dg Chest Port 1 View  Result Date: 02/26/2017 CLINICAL DATA:  Cough and worsening shortness of breath. EXAM: PORTABLE CHEST 1 VIEW COMPARISON:  Chest x-ray dated March 04, 2017. FINDINGS: Left chest wall AICD in unchanged position. Mild cardiomegaly, unchanged. Persistent mild pulmonary vascular congestion with slightly improved interstitial opacities. No focal consolidation, pleural effusion, or pneumothorax. No acute osseous abnormality. IMPRESSION: Stable cardiomegaly and pulmonary vascular congestion with slightly improved interstitial edema. Electronically Signed   By: Titus Dubin M.D.   On: 02/26/2017 09:42    Cardiac Studies    Patient Profile     71 y.o. female with hx nonischemic CM, HFrEF (45%), s/p Bi-ventricular pacer/ICD, obesity, OSA, AFL, and CKD p/w SOB. Patient found to be in recurrent atrial flutter. She was admitted for IV diuresis and ultimately cardioversion.    Assessment & Plan    1  Atrial flutter   Underwent D/C  cardioversion yesterday  Reamins in SR  Continue eliquis  2  Acute on cronic systolic and diastolic CHF  Still with some volume increase on exam   Cr is sl better  Give lasix 80 IV x 1     3 CKD Follow   4  COPD  Still with wheezes and pops  Not on prednisone  Pt followed by  IM   For questions or updates, please contact [facility] Please consult [email or web address] for contact info under Cardiology/STEMI.      Signed, Dorris Carnes, MD  02/27/2017, 7:45 AM

## 2017-02-28 ENCOUNTER — Inpatient Hospital Stay (HOSPITAL_COMMUNITY): Payer: Medicare Other

## 2017-02-28 DIAGNOSIS — R05 Cough: Secondary | ICD-10-CM

## 2017-02-28 DIAGNOSIS — J449 Chronic obstructive pulmonary disease, unspecified: Secondary | ICD-10-CM

## 2017-02-28 DIAGNOSIS — E038 Other specified hypothyroidism: Secondary | ICD-10-CM

## 2017-02-28 LAB — CBC
HEMATOCRIT: 36.9 % (ref 36.0–46.0)
HEMOGLOBIN: 12.1 g/dL (ref 12.0–15.0)
MCH: 28.1 pg (ref 26.0–34.0)
MCHC: 32.8 g/dL (ref 30.0–36.0)
MCV: 85.6 fL (ref 78.0–100.0)
Platelets: 186 10*3/uL (ref 150–400)
RBC: 4.31 MIL/uL (ref 3.87–5.11)
RDW: 14.7 % (ref 11.5–15.5)
WBC: 5 10*3/uL (ref 4.0–10.5)

## 2017-02-28 LAB — BASIC METABOLIC PANEL
ANION GAP: 11 (ref 5–15)
BUN: 40 mg/dL — ABNORMAL HIGH (ref 6–20)
CALCIUM: 9.3 mg/dL (ref 8.9–10.3)
CO2: 21 mmol/L — ABNORMAL LOW (ref 22–32)
Chloride: 102 mmol/L (ref 101–111)
Creatinine, Ser: 1.82 mg/dL — ABNORMAL HIGH (ref 0.44–1.00)
GFR calc non Af Amer: 27 mL/min — ABNORMAL LOW (ref >60)
GFR, EST AFRICAN AMERICAN: 32 mL/min — AB (ref >60)
GLUCOSE: 145 mg/dL — AB (ref 65–99)
POTASSIUM: 4.2 mmol/L (ref 3.5–5.1)
Sodium: 134 mmol/L — ABNORMAL LOW (ref 135–145)

## 2017-02-28 NOTE — Progress Notes (Signed)
Patient ID: Morgan Clay, female   DOB: 10/23/1945, 71 y.o.   MRN: [id number]  PROGRESS NOTE    Morgan Clay  KDT:[id number] DOB: [birth year] DOA: 02/22/2017 PCP: [name], DO   Brief Narrative:  71 year old female with history of nonischemic cardiomyopathy with EF of 45-50% (01/14/16), atrial flutter, status post AICD and biventricular pacemaker, hypothyroidism, chronic kidney disease stage III was sent to the ER but patient's cardiologist due to atrial flutter in the office and 10 pounds weight gain from acute fluid overload. She was started on intravenous Lasix. She had DC cardioversion on 02/26/2017. Pulmonary was consulted for wheezing.   Assessment & Plan:   Principal Problem:   Acute on chronic systolic CHF (congestive heart failure) (HCC) Active Problems:   Hypothyroidism   OBSTRUCTIVE SLEEP APNEA   Biventricular ICD (implantable cardioverter-defibrillator) in place   Nonischemic cardiomyopathy (HCC)   Type 2 diabetes, controlled, with renal manifestation (HCC)   Atypical atrial flutter (HCC)   CHF (congestive heart failure) (HCC)   Essential hypertension   Cough   Wheezes   Chronic obstructive pulmonary disease (HCC)  Acute on chronic systolic CHF (congestive heart failure)  - Continue strict input and output. Daily weights. Negative fluid balance of 1448.4 mL since admission - Cardiology following. Lasix held for today. Repeat a.m. labs - echo 02/24/17 EF 45-50% -Continue BIDIL, entresto, aldactone, coreg  Atrial flutter s/p DC cardioversion -Currently in sinus rhythm. Continue Eliquis  AKI on CKD stage 3 -Creatinine 1.8 to today. Lasix dose held for today. - Check a.m. creatinine -avoid nephrotoxic agents  Cough with wheezing -Probably secondary to pulmonary edema. Pulmonary evaluation appreciated. Solu-Medrol discontinued. Atrovent discontinued by pulmonary. Outpatient follow-up with pulmonary. follow chest x-ray for today.  - No infectious  process  Hyponatremia -Improving -Bmp in am  Hypothyroidism - Continue levothyroxine  Biventricular ICD (implantable cardioverter-defibrillator) in place - cardiology will follow up as outpt  Essential hypertension -Monitor blood pressure. Continue antihypertensives  Chronic depression/anxiety -stable -Continue Zoloft, trazodone  Reported hx OSA -Outpatient follow-up with pulmonary   DVT prophylaxis: Eliquis Code Status:  Full Family Communication: NONE at bedside Disposition Plan: Home in 1-2 days once cleared by cardiology  Consultants: Cardiology and pulmonary  Procedures: DC cardioversion on 02/26/2017 Study Conclusions  - Left ventricle: The cavity size was normal. There was moderate   concentric hypertrophy. Systolic function was mildly reduced. The   estimated ejection fraction was in the range of 45% to 50%. Wall   motion was normal; there were no regional wall motion   abnormalities. The study is not technically sufficient to allow   evaluation of LV diastolic function. Doppler parameters are   consistent with high ventricular filling pressure. - Aortic valve: Transvalvular velocity was within the normal range.   There was no stenosis. There was moderate regurgitation. - Mitral valve: Mildly calcified annulus. Mildly thickened leaflets   . Transvalvular velocity was within the normal range. There was   no evidence for stenosis. There was mild regurgitation. - Left atrium: The atrium was severely dilated. - Right ventricle: The cavity size was normal. Wall thickness was   normal. Systolic function was normal. - Tricuspid valve: There was severe regurgitation. - Pulmonary arteries: Systolic pressure was mildly increased. PA   peak pressure: 47 mm Hg (S).   Antimicrobials: none  Subjective: Patient seen and examined at bedside. She still is coughing but feels better. No fever, nausea or vomiting.  Objective: Vitals:   02/27/17 2106  02/28/17 9983 02/28/17 3825  02/28/17 1200  BP: (!) 156/74 (!) 168/67 (!) 193/65 (!) 120/48  Pulse: 72 69  65  Resp: 18 18  18   Temp: 98.5 F (36.9 C) 98.2 F (36.8 C)  98 F (36.7 C)  TempSrc: Oral Oral  Oral  SpO2: 97% 97%  98%  Weight:  68.7 kg (151 lb 6.4 oz)    Height:        Intake/Output Summary (Last 24 hours) at 02/28/17 1418 Last data filed at 02/28/17 1251  Gross per 24 hour  Intake              480 ml  Output             1000 ml  Net             -520 ml   Filed Weights   02/25/17 0503 02/26/17 0524 02/28/17 0512  Weight: 68.1 kg (150 lb 1.6 oz) 68.8 kg (151 lb 11.2 oz) 68.7 kg (151 lb 6.4 oz)    Examination:  General exam: Appears calm and comfortable  Respiratory system: Bilateral decreased breath sound at bases with basilar crackles; no wheezing Cardiovascular system: S1 & S2 heard, rate controlled  Gastrointestinal system: Abdomen is nondistended, soft and nontender. Normal bowel sounds heard. Extremities: No cyanosis, clubbing; 1+ pitting edema  Data Reviewed: I have personally reviewed following labs and imaging studies  CBC:  Recent Labs Lab 02/23/17 0708 02/24/17 0505 02/25/17 0701 02/26/17 0532 02/28/17 0538  WBC 5.4 5.4 4.6 5.6 5.0  NEUTROABS  --  2.8 2.5 2.5  --   HGB 11.1* 11.7* 11.9* 12.3 12.1  HCT 34.5* 35.0* 36.3 36.9 36.9  MCV 86.9 86.6 86.2 86.6 85.6  PLT 151 164 161 160 332   Basic Metabolic Panel:  Recent Labs Lab 02/25/17 0701 02/25/17 1154 02/26/17 0532 02/27/17 0514 02/28/17 0538  NA 133* 132* 130* 135 134*  K 3.7 3.8 3.7 3.6 4.2  CL 101 101 98* 103 102  CO2 22 23 21* 22 21*  GLUCOSE 157* 128* 107* 92 145*  BUN 27* 28* 31* 33* 40*  CREATININE 1.84* 1.85* 1.85* 1.69* 1.82*  CALCIUM 8.9 8.8* 8.8* 8.9 9.3  MG 2.0  --   --   --   --    GFR: Estimated Creatinine Clearance: 24.6 mL/min (A) (by C-G formula based on SCr of 1.82 mg/dL (H)). Liver Function Tests: No results for input(s): AST, ALT, ALKPHOS, BILITOT, PROT,  ALBUMIN in the last 168 hours. No results for input(s): LIPASE, AMYLASE in the last 168 hours. No results for input(s): AMMONIA in the last 168 hours. Coagulation Profile:  Recent Labs Lab 02/25/17 1154  INR 1.51   Cardiac Enzymes:  Recent Labs Lab 02/22/17 1817 02/23/17 0104 02/23/17 0708 02/23/17 1226  TROPONINI 0.04* 0.05* 0.05* 0.04*   BNP (last 3 results) No results for input(s): PROBNP in the last 8760 hours. HbA1C: No results for input(s): HGBA1C in the last 72 hours. CBG: No results for input(s): GLUCAP in the last 168 hours. Lipid Profile: No results for input(s): CHOL, HDL, LDLCALC, TRIG, CHOLHDL, LDLDIRECT in the last 72 hours. Thyroid Function Tests: No results for input(s): TSH, T4TOTAL, FREET4, T3FREE, THYROIDAB in the last 72 hours. Anemia Panel: No results for input(s): VITAMINB12, FOLATE, FERRITIN, TIBC, IRON, RETICCTPCT in the last 72 hours. Sepsis Labs:  Recent Labs Lab 02/27/17 1554  PROCALCITON <0.10    No results found for this or any previous visit (from the past 240 hour(s)).  Radiology Studies: Dg Chest Port 1 View  Result Date: 02/28/2017 CLINICAL DATA:  Shortness of breath EXAM: PORTABLE CHEST 1 VIEW COMPARISON:  02/26/2017 FINDINGS: Pacemaker/ AICD has a good appearance. Chronic cardiomegaly and aortic atherosclerosis. Abnormal patchy density in both lower lobes worrisome for pneumonia, or possibly residual edema. No dense consolidation or lobar collapse. No effusions. Bony structures are unremarkable. IMPRESSION: Cardiomegaly. Patchy density in both lower lungs that could represent mild pneumonia or residual edema. Electronically Signed   By: Nelson Chimes M.D.   On: 02/28/2017 11:16        Scheduled Meds: . apixaban  5 mg Oral BID  . carvedilol  25 mg Oral BID WC  . fluticasone  2 spray Each Nare Daily  . isosorbide-hydrALAZINE  1 tablet Oral BID  . levothyroxine  137 mcg Oral QAC breakfast  . sacubitril-valsartan  1  tablet Oral BID  . sertraline  100 mg Oral Daily  . sodium chloride flush  3 mL Intravenous Q12H  . sodium chloride flush  3 mL Intravenous Q12H  . spironolactone  25 mg Oral Daily  . traZODone  50 mg Oral QHS   Continuous Infusions: . sodium chloride 250 mL (02/26/17 1000)  . sodium chloride 250 mL (02/26/17 1000)     LOS: 5 days        Aline August, MD [facility] Pager [phone]  If 7PM-7AM, please contact night-coverage [email or web address] Password [email or web address] 02/28/2017, 2:18 PM

## 2017-02-28 NOTE — Progress Notes (Signed)
Name: Morgan Clay MRN: [id number] DOB: 1945-12-17    ADMISSION DATE:  02/22/2017 CONSULTATION DATE:  02/27/17  REFERRING MD :  Nevada Crane  CHIEF COMPLAINT:  SOB  BRIEF PATIENT DESCRIPTION:  71 y.o. female with multiple medical problems including a history of subjective sleep apnea. She has not been utilizing her CPAP therapy for over 7 years. Patient endorsed a two-week history of weight gain of 10-20 pounds preceding her admission on 10/19. She was admitted with uncontrolled hypertension and shortness of breath. Patient also noted increasing lower extremity edema. She was subsequently evaluated by cardiology and has been undergoing diuresis for decompensation of her congestive heart failure. At time of evaluation patient was on room air and denied any fever or sick contacts. She did endorse a cough productive of a clear to white colored sputum. Reportedly she has been compliant with her outpatient diuretic therapy. She has no known personal history of tobacco use and has alsonot been told that she has COPD. However, patient does have a childhood history of asthma and significant secondhand smoke exposure.   SUBJECTIVE:  Patient underwent DC cardioversion yesterday with conversion to sinus rhythm. Reports wheezing is improving. Lower extremity edema has significant improved. Dyspnea relatively unchanged. Patient also admits that she ambulated in the hallway with less dyspnea on exertion. Denies any chest pain or pressure. Continuing to have a relatively unchanged cough.  REVIEW OF SYSTEMS:  Denies any subjective fever, chills, sweats. No abdominal pain or nausea.  VITAL SIGNS: Temp:  [98.2 F (36.8 C)-98.5 F (36.9 C)] 98.2 F (36.8 C) (10/24 0512) Pulse Rate:  [69-72] 69 (10/24 0512) Resp:  [18] 18 (10/24 0512) BP: (156-193)/(65-74) 193/65 (10/24 0620) SpO2:  [97 %] 97 % (10/24 0512) Weight:  [151 lb 6.4 oz (68.7 kg)] 151 lb 6.4 oz (68.7 kg) (10/24 0512)  PHYSICAL  EXAMINATION: General:  Awake. Alert. No acute distress. Sitting watching TV.  Integument:  Warm & dry. No rash on exposed skin. No bruising. Extremities:  No cyanosis or clubbing.  HEENT:  Moist mucus membranes. No oral ulcers. No scleral injection or icterus.  Cardiovascular:  Regular rate. No edema. Unable to appreciate JVD with body positioning.  Pulmonary:  Mild, coarse wheeze noted bilaterally with both inspiratory and expiratory phase. Normal work of breathing on room air. Abdomen: Soft. Normal bowel sounds. Nontender. Musculoskeletal:  Normal bulk and tone. No joint deformity or effusion appreciated. Neurological:  Cranial nerves 2-12 grossly in tact. No meningismus. Moving all 4 extremities equally.    Recent Labs Lab 02/26/17 0532 02/27/17 0514 02/28/17 0538  NA 130* 135 134*  K 3.7 3.6 4.2  CL 98* 103 102  CO2 21* 22 21*  BUN 31* 33* 40*  CREATININE 1.85* 1.69* 1.82*  GLUCOSE 107* 92 145*    Recent Labs Lab 02/25/17 0701 02/26/17 0532 02/28/17 0538  HGB 11.9* 12.3 12.1  HCT 36.3 36.9 36.9  WBC 4.6 5.6 5.0  PLT 161 160 186   Dg Chest Port 1 View  Result Date: 02/28/2017 CLINICAL DATA:  Shortness of breath EXAM: PORTABLE CHEST 1 VIEW COMPARISON:  02/26/2017 FINDINGS: Pacemaker/ AICD has a good appearance. Chronic cardiomegaly and aortic atherosclerosis. Abnormal patchy density in both lower lobes worrisome for pneumonia, or possibly residual edema. No dense consolidation or lobar collapse. No effusions. Bony structures are unremarkable. IMPRESSION: Cardiomegaly. Patchy density in both lower lungs that could represent mild pneumonia or residual edema. Electronically Signed   By: Nelson Chimes M.D.   On: 02/28/2017 11:16  STUDIES: PFT 1/7/4:  FVC 1.29 L (62%) FEV1 0.85 L (52%) FEV1/FVC 0.66 FEF 25-75 0.30 L (18%) POLYSOMNOGRAM 6/3/9:  Epworth sleepiness score 9. Height 59 inches, weight 160 pounds, & BMI 32.3. Mild obstructive sleep apnea with AHI 11 MN/hour & RDI 19  events/hour. Lowest saturation 81%. TTE 02/24/17:  LV normal in size with moderate concentric hypertrophy. EF 45-50% without regional wall motion abnormality. Insufficient to assess diastolic function but Doppler parameters consistent with high ventricular filling pressure. LA severely dilated & RA normal in size. RV normal in size and function with pacer wire noted within right ventricle. Pulmonary artery systolic pressure 47 mmHg. Moderate aortic regurgitation without stenosis. Aortic root normal in size. Mild mitral regurgitation without stenosis. No pulmonic stenosis or regurgitation. Severe tricuspid regurgitation. No pericardial effusion. PORT CXR 10/24:  Personally reviewed by me. Minimal patchy basilar opacity somewhat improved compared with previous x-ray imaging. No pleural effusion appreciated. Cardiomegaly suggested. Implanted device with a left anterior chest. No obvious consolidation or mass appreciated.  ASSESSMENT / PLAN:  71 y.o. female admitted with shortness of breath found to have acute diastolic and acute on chronic systolic congestive heart failure. Clinically improving with diuresis and cardioversion but worsening renal function. Review of previous pulmonary function testing/spirometry from 2000 for does indicate moderate-severe airways obstruction. Patient's echocardiogram does suggest possible pulmonary hypertension but I suspect this is more related to volume overload and her left ventricular dysfunction. She does have underlying mild obstructive sleep apnea based on her previous polysomnogram from 2009. Notably her weight at that time was 160 pounds and current weight is 151 pounds. Patient does report a childhood history of asthma as well as chronic bronchitis.  1. Wheezing: Likely secondary to pulmonary edema. No role for bronchodilator therapy at this time. Further management of acute heart failure as per cardiology and primary service. 2. Moderate-severe COPD: Suggested by  previous pulmonary function testing. Plan for outpatient pulmonary function testing. No role for bronchodilator therapy or steroids at this time. 3. Mild OSA: Based on polysomnogram from 2009. Plan for repeat polysomnogram as an outpatient. Discontinuing CPAP therapy. 4. Follow-up: Patient can follow-up in our office 2-4 weeks after discharge.  I have spent a total of 38 minutes of time today caring for the patient, reviewing the patient's electronic medical record, and with more than 50% of that time spent coordinating care with the patient as well as reviewing the continuing plan of care with the patient at bedside.  Remainder of care as per primary service and other consultants.  Sonia Baller Ashok Cordia, M.D. [facility] Pulmonary & Critical Care Pager:  [phone] After 7pm or if no response, call [phone] 02/28/2017 12:32 PM

## 2017-02-28 NOTE — Progress Notes (Signed)
Progress Note  Patient Name: Morgan Clay Date of Encounter: 02/28/2017  Primary Cardiologist: Caryl Comes  Subjective   Breathing is better than yestrday  No CP    Inpatient Medications    Scheduled Meds: . apixaban  5 mg Oral BID  . carvedilol  25 mg Oral BID WC  . fluticasone  2 spray Each Nare Daily  . isosorbide-hydrALAZINE  1 tablet Oral BID  . levothyroxine  137 mcg Oral QAC breakfast  . sacubitril-valsartan  1 tablet Oral BID  . sertraline  100 mg Oral Daily  . sodium chloride flush  3 mL Intravenous Q12H  . sodium chloride flush  3 mL Intravenous Q12H  . spironolactone  25 mg Oral Daily  . traZODone  50 mg Oral QHS   Continuous Infusions: . sodium chloride 250 mL (02/26/17 1000)  . sodium chloride 250 mL (02/26/17 1000)   PRN Meds: acetaminophen **OR** acetaminophen, guaiFENesin-dextromethorphan, hydrALAZINE, nitroGLYCERIN, ondansetron **OR** ondansetron (ZOFRAN) IV, sodium chloride flush, sodium chloride flush, traMADol   Vital Signs    Vitals:   02/27/17 1139 02/27/17 2106 02/28/17 0512 02/28/17 0620  BP: (!) 113/46 (!) 156/74 (!) 168/67 (!) 193/65  Pulse: (!) 59 72 69   Resp: 18 18 18    Temp: 98.2 F (36.8 C) 98.5 F (36.9 C) 98.2 F (36.8 C)   TempSrc: Oral Oral Oral   SpO2: 96% 97% 97%   Weight:   151 lb 6.4 oz (68.7 kg)   Height:        Intake/Output Summary (Last 24 hours) at 02/28/17 1122 Last data filed at 02/28/17 1052  Gross per 24 hour  Intake              480 ml  Output              700 ml  Net             -220 ml   Filed Weights   02/25/17 0503 02/26/17 0524 02/28/17 0512  Weight: 150 lb 1.6 oz (68.1 kg) 151 lb 11.2 oz (68.8 kg) 151 lb 6.4 oz (68.7 kg)    Telemetry    SR  Paced   - Personally Reviewed  ECG     Physical Exam   GEN: No acute distress.   Neck: JVP is increased  Cardiac: RRR, no murmurs, rubs, or gallops.  Respiratory: Rhonchi, pop, wheeze   GI: Soft, nontender, non-distended  MS: No edema; No  deformity. Neuro:  Nonfocal  Psych: Normal affect   Labs    Chemistry  Recent Labs Lab 02/26/17 0532 02/27/17 0514 02/28/17 0538  NA 130* 135 134*  K 3.7 3.6 4.2  CL 98* 103 102  CO2 21* 22 21*  GLUCOSE 107* 92 145*  BUN 31* 33* 40*  CREATININE 1.85* 1.69* 1.82*  CALCIUM 8.8* 8.9 9.3  GFRNONAA 27* 30* 27*  GFRAA 31* 35* 32*  ANIONGAP 11 10 11      Hematology  Recent Labs Lab 02/25/17 0701 02/26/17 0532 02/28/17 0538  WBC 4.6 5.6 5.0  RBC 4.21 4.26 4.31  HGB 11.9* 12.3 12.1  HCT 36.3 36.9 36.9  MCV 86.2 86.6 85.6  MCH 28.3 28.9 28.1  MCHC 32.8 33.3 32.8  RDW 15.0 15.0 14.7  PLT 161 160 186    Cardiac Enzymes  Recent Labs Lab 02/22/17 1817 02/23/17 0104 02/23/17 0708 02/23/17 1226  TROPONINI 0.04* 0.05* 0.05* 0.04*     Recent Labs Lab 02/22/17 1845  TROPIPOC 0.03  BNP  Recent Labs Lab 02/22/17 2126  BNP 1,275.8*     DDimer No results for input(s): DDIMER in the last 168 hours.   Radiology    Dg Chest Port 1 View  Result Date: 02/28/2017 CLINICAL DATA:  Shortness of breath EXAM: PORTABLE CHEST 1 VIEW COMPARISON:  02/26/2017 FINDINGS: Pacemaker/ AICD has a good appearance. Chronic cardiomegaly and aortic atherosclerosis. Abnormal patchy density in both lower lobes worrisome for pneumonia, or possibly residual edema. No dense consolidation or lobar collapse. No effusions. Bony structures are unremarkable. IMPRESSION: Cardiomegaly. Patchy density in both lower lungs that could represent mild pneumonia or residual edema. Electronically Signed   By: Nelson Chimes M.D.   On: 02/28/2017 11:16    Cardiac Studies    Patient Profile     71 y.o. female with hx nonischemic CM, HFrEF (45%), s/p Bi-ventricular pacer/ICD, obesity, OSA, AFL, and CKD p/w SOB. Patient found to be in recurrent atrial flutter. She was admitted for IV diuresis and ultimately cardioversion.    Assessment & Plan    1  Atrial flutter   Underwent D/C cardioversion  yesterday  Reamins in SR Keep on eliquis  2  Acute on cronic systolic and diastolic CHF  Volume is improving  REnal function limits   3 CKD Follow   Sl bump in Cr to 1.82  Hold further lasix    4  COPD  Pt still with some wheeze and rhonchi  She may have some fluid but I think some is primary pulmonary  IM following     For questions or updates, please contact [facility] Please consult [email or web address] for contact info under Cardiology/STEMI.      Signed, Dorris Carnes, MD  02/28/2017, 11:22 AM

## 2017-03-01 ENCOUNTER — Other Ambulatory Visit: Payer: Self-pay | Admitting: Cardiology

## 2017-03-01 DIAGNOSIS — G4733 Obstructive sleep apnea (adult) (pediatric): Secondary | ICD-10-CM

## 2017-03-01 DIAGNOSIS — N183 Chronic kidney disease, stage 3 unspecified: Secondary | ICD-10-CM

## 2017-03-01 DIAGNOSIS — I5043 Acute on chronic combined systolic (congestive) and diastolic (congestive) heart failure: Secondary | ICD-10-CM

## 2017-03-01 DIAGNOSIS — R062 Wheezing: Secondary | ICD-10-CM

## 2017-03-01 LAB — BASIC METABOLIC PANEL
ANION GAP: 10 (ref 5–15)
BUN: 39 mg/dL — ABNORMAL HIGH (ref 6–20)
CALCIUM: 8.8 mg/dL — AB (ref 8.9–10.3)
CO2: 25 mmol/L (ref 22–32)
Chloride: 102 mmol/L (ref 101–111)
Creatinine, Ser: 1.64 mg/dL — ABNORMAL HIGH (ref 0.44–1.00)
GFR calc Af Amer: 36 mL/min — ABNORMAL LOW (ref >60)
GFR, EST NON AFRICAN AMERICAN: 31 mL/min — AB (ref >60)
GLUCOSE: 127 mg/dL — AB (ref 65–99)
Potassium: 3.7 mmol/L (ref 3.5–5.1)
SODIUM: 137 mmol/L (ref 135–145)

## 2017-03-01 LAB — MAGNESIUM: Magnesium: 2.2 mg/dL (ref 1.7–2.4)

## 2017-03-01 MED ORDER — SPIRONOLACTONE 25 MG PO TABS: 25.0000 mg | ORAL_TABLET | Freq: Every day | ORAL | 0 refills | Status: DC

## 2017-03-01 MED ORDER — FUROSEMIDE 40 MG PO TABS: 60.0000 mg | ORAL_TABLET | Freq: Every day | ORAL | Status: DC

## 2017-03-01 NOTE — Progress Notes (Signed)
Name: Morgan Clay MRN: [id number] DOB: 1945-06-12    ADMISSION DATE:  02/22/2017   CONSULTATION DATE:  02/27/17  REFERRING MD :  Nevada Crane  CHIEF COMPLAINT:  SOB  BRIEF PATIENT DESCRIPTION:  71 y.o. female with multiple medical problems including a history of subjective sleep apnea. She has not been utilizing her CPAP therapy for over 7 years. Patient endorsed a two-week history of weight gain of 10-20 pounds preceding her admission on 10/19. She was admitted with uncontrolled hypertension and shortness of breath. Patient also noted increasing lower extremity edema. She was subsequently evaluated by cardiology and has been undergoing diuresis for decompensation of her congestive heart failure. At time of evaluation patient was on room air and denied any fever or sick contacts. She did endorse a cough productive of a clear to white colored sputum. Reportedly she has been compliant with her outpatient diuretic therapy. She has no known personal history of tobacco use and has alsonot been told that she has COPD. However, patient does have a childhood history of asthma and significant secondhand smoke exposure.   SUBJECTIVE:  Cough and wheezing are improving. Cough remains nonproductive. Dyspnea is improving as well. Patient ambulated in the hallway.  REVIEW OF SYSTEMS:  No chest pain, pressure, or palpitations. No subjective fever or chills.  VITAL SIGNS: Temp:  [97.8 F (36.6 C)-98.3 F (36.8 C)] 97.8 F (36.6 C) (10/25 1231) Pulse Rate:  [58-60] 60 (10/25 1231) Resp:  [18-20] 18 (10/25 1231) BP: (122-165)/(53-62) 165/62 (10/25 1231) SpO2:  [98 %-100 %] 98 % (10/25 1231) Weight:  [158 lb 12.8 oz (72 kg)] 158 lb 12.8 oz (72 kg) (10/25 0427)  PHYSICAL EXAMINATION: Gen.: Awake. Alert. Gene no distress. Integument: No rash. Warm. Dry. Cardiovascular: Regular rate. Regular rhythm. No edema. Pulmonary: No wheezing appreciated. Aeration bilaterally. Clear with auscultation. Abdomen: Soft.  Protuberant. Normal bowel sounds.    Recent Labs Lab 02/27/17 0514 02/28/17 0538 03/01/17 0301  NA 135 134* 137  K 3.6 4.2 3.7  CL 103 102 102  CO2 22 21* 25  BUN 33* 40* 39*  CREATININE 1.69* 1.82* 1.64*  GLUCOSE 92 145* 127*    Recent Labs Lab 02/25/17 0701 02/26/17 0532 02/28/17 0538  HGB 11.9* 12.3 12.1  HCT 36.3 36.9 36.9  WBC 4.6 5.6 5.0  PLT 161 160 186   Dg Chest Port 1 View  Result Date: 02/28/2017 CLINICAL DATA:  Shortness of breath EXAM: PORTABLE CHEST 1 VIEW COMPARISON:  02/26/2017 FINDINGS: Pacemaker/ AICD has a good appearance. Chronic cardiomegaly and aortic atherosclerosis. Abnormal patchy density in both lower lobes worrisome for pneumonia, or possibly residual edema. No dense consolidation or lobar collapse. No effusions. Bony structures are unremarkable. IMPRESSION: Cardiomegaly. Patchy density in both lower lungs that could represent mild pneumonia or residual edema. Electronically Signed   By: Nelson Chimes M.D.   On: 02/28/2017 11:16   STUDIES: PFT 1/7/4:  FVC 1.29 L (62%) FEV1 0.85 L (52%) FEV1/FVC 0.66 FEF 25-75 0.30 L (18%) POLYSOMNOGRAM 6/3/9:  Epworth sleepiness score 9. Height 59 inches, weight 160 pounds, & BMI 32.3. Mild obstructive sleep apnea with AHI 11 MN/hour & RDI 19 events/hour. Lowest saturation 81%. TTE 02/24/17:  LV normal in size with moderate concentric hypertrophy. EF 45-50% without regional wall motion abnormality. Insufficient to assess diastolic function but Doppler parameters consistent with high ventricular filling pressure. LA severely dilated & RA normal in size. RV normal in size and function with pacer wire noted within right ventricle. Pulmonary artery  systolic pressure 47 mmHg. Moderate aortic regurgitation without stenosis. Aortic root normal in size. Mild mitral regurgitation without stenosis. No pulmonic stenosis or regurgitation. Severe tricuspid regurgitation. No pericardial effusion. PORT CXR 10/24:  Previously reviewed by  me. Minimal patchy basilar opacity somewhat improved compared with previous x-ray imaging. No pleural effusion appreciated. Cardiomegaly suggested. Implanted device with a left anterior chest. No obvious consolidation or mass appreciated.  ASSESSMENT / PLAN:  71 y.o. female with a history of sleep apnea from a remote polysomnogram. Patient also with suggestion of moderate-severe COPD based on previous spirometry. Symptomatically the patient continues to improve following her cardioversion. As such, I'm somewhat skeptical about the contribution of either her sleep apnea or her possible underlying COPD to her presenting symptoms and clinical picture. As such, I am not starting treatment for either of these at this time.   1. Wheezing: Resolved. Likely due to volume overload. 2. Moderate-severe COPD: Suggested by previous pulmonary function testing. Plan to order outpatient pulmonary function testing to reassess. 3. Mild OSA: Based on 2009 polysomnogram. Plan to reassess as outpatient. 4. Follow-up: Patient scheduled for a follow-up appointment in our office.  Remainder of care as per primary service and other consultants.  Sonia Baller Ashok Cordia, M.D. [facility] Pulmonary & Critical Care Pager:  [phone] After 7pm or if no response, call [phone] 03/01/2017 1:39 PM

## 2017-03-01 NOTE — Progress Notes (Signed)
Name: Morgan Clay MRN: [id number] DOB: 05/13/1945    ADMISSION DATE:  02/22/2017 CONSULTATION DATE:  02/27/17  REFERRING MD :  Nevada Crane  CHIEF COMPLAINT:  SOB  BRIEF PATIENT DESCRIPTION:  71 y.o. female with multiple medical problems including a history of subjective sleep apnea. She has not been utilizing her CPAP therapy for over 7 years. Patient endorsed a two-week history of weight gain of 10-20 pounds preceding her admission on 10/19. She was admitted with uncontrolled hypertension and shortness of breath. Patient also noted increasing lower extremity edema. She was subsequently evaluated by cardiology and has been undergoing diuresis for decompensation of her congestive heart failure. At time of evaluation patient was on room air and denied any fever or sick contacts. She did endorse a cough productive of a clear to white colored sputum. Reportedly she has been compliant with her outpatient diuretic therapy. She has no known personal history of tobacco use and has alsonot been told that she has COPD. However, patient does have a childhood history of asthma and significant secondhand smoke exposure.   SUBJECTIVE:   Continues to feel better, no distress, shortness of breath resolved. VITAL SIGNS: Temp:  [98 F (36.7 C)-98.3 F (36.8 C)] 98 F (36.7 C) (10/25 0427) Pulse Rate:  [58-65] 59 (10/25 0427) Resp:  [18-20] 20 (10/25 0427) BP: (120-147)/(48-57) 122/57 (10/25 0427) SpO2:  [98 %-100 %] 100 % (10/25 0427) Weight:  [158 lb 12.8 oz (72 kg)] 158 lb 12.8 oz (72 kg) (10/25 0427) Room air PHYSICAL EXAMINATION: General: Awake oriented no acute distress sitting up in bed able to complete full sentences HEENT normocephalic atraumatic no JVD mucous membranes are moist.  Posterior pharynx is slightly erythemic with clear postnasal discharge Pulmonary some transmitted upper airway rhonchi/wheeze otherwise clear no accessory muscle use Cardiac: Regular rate and rhythm without murmur rub  or gallop Extremities/musculoskeletal.  No significant edema brisk cap refill Abdomen soft nontender no organomegaly Neuro: Awake oriented no focal deficits.  Recent Labs Lab 02/27/17 0514 02/28/17 0538 03/01/17 0301  NA 135 134* 137  K 3.6 4.2 3.7  CL 103 102 102  CO2 22 21* 25  BUN 33* 40* 39*  CREATININE 1.69* 1.82* 1.64*  GLUCOSE 92 145* 127*    Recent Labs Lab 02/25/17 0701 02/26/17 0532 02/28/17 0538  HGB 11.9* 12.3 12.1  HCT 36.3 36.9 36.9  WBC 4.6 5.6 5.0  PLT 161 160 186   Dg Chest Port 1 View  Result Date: 02/28/2017 CLINICAL DATA:  Shortness of breath EXAM: PORTABLE CHEST 1 VIEW COMPARISON:  02/26/2017 FINDINGS: Pacemaker/ AICD has a good appearance. Chronic cardiomegaly and aortic atherosclerosis. Abnormal patchy density in both lower lobes worrisome for pneumonia, or possibly residual edema. No dense consolidation or lobar collapse. No effusions. Bony structures are unremarkable. IMPRESSION: Cardiomegaly. Patchy density in both lower lungs that could represent mild pneumonia or residual edema. Electronically Signed   By: Nelson Chimes M.D.   On: 02/28/2017 11:16   STUDIES: PFT 1/7/4:  FVC 1.29 L (62%) FEV1 0.85 L (52%) FEV1/FVC 0.66 FEF 25-75 0.30 L (18%) POLYSOMNOGRAM 6/3/9:  Epworth sleepiness score 9. Height 59 inches, weight 160 pounds, & BMI 32.3. Mild obstructive sleep apnea with AHI 11 MN/hour & RDI 19 events/hour. Lowest saturation 81%. TTE 02/24/17:  LV normal in size with moderate concentric hypertrophy. EF 45-50% without regional wall motion abnormality. Insufficient to assess diastolic function but Doppler parameters consistent with high ventricular filling pressure. LA severely dilated & RA normal in size.  RV normal in size and function with pacer wire noted within right ventricle. Pulmonary artery systolic pressure 47 mmHg. Moderate aortic regurgitation without stenosis. Aortic root normal in size. Mild mitral regurgitation without stenosis. No pulmonic  stenosis or regurgitation. Severe tricuspid regurgitation. No pericardial effusion. PORT CXR 10/24:  Personally reviewed by me. Minimal patchy basilar opacity somewhat improved compared with previous x-ray imaging. No pleural effusion appreciated. Cardiomegaly suggested. Implanted device with a left anterior chest. No obvious consolidation or mass appreciated.  ASSESSMENT / PLAN:  Acute on chronic diastolic heart failure Pulmonary edema Moderate to severe chronic obstructive pulmonary disease Possible pulmonary artery hypertension Mild obstructive sleep apnea Wheezing Atrial flutter now status post cardioversion Acute on chronic kidney failure CKD stage III Hyponatremia Hypothyroidism Hypertension Depression Anxiety    71 y.o. female admitted with shortness of breath found to have acute diastolic and acute on chronic systolic congestive heart failure. Clinically improving with diuresis and cardioversion but worsening renal function. Review of previous pulmonary function testing/spirometry from 2000 for does indicate moderate-severe airways obstruction. Patient's echocardiogram does suggest possible pulmonary hypertension but suspect this is more related to volume overload and her left ventricular dysfunction. She does have underlying mild obstructive sleep apnea based on her previous polysomnogram from 2009. Notably her weight at that time was 160 pounds and current weight is 158 pounds. Patient does report a childhood history of asthma as well as chronic bronchitis.  She has improved with IV diuresis following cardioversion.  Currently denies chest pain shortness  breath.  Acute on chronic diastolic heart failure with resultant pulmonary edema Plan Per heart failure team.  Wheezing: Likely secondary to pulmonary edema. Plan Holding off on bronchodilators, recommend treating heart failure  Moderate-severe COPD: Suggested by previous pulmonary function testing. Plan Repeat formal  pulmonary function tests, CAT scale, and decide on appropriate bronchodilators if indicated Have arranged f/u in our office   Mild OSA: Based on polysomnogram from 2009.  Plan  Repeat outpatient polysomnogram   [name] Colace ACNP-BC [place] Pager # [phone] OR # [phone] if no answer   03/01/2017 10:35 AM

## 2017-03-01 NOTE — Care Management Important Message (Signed)
Important Message  Patient Details  Name: Morgan Clay MRN: [id number] Date of Birth: [birth year]   Medicare Important Message Given:  Yes    Nathen May 03/01/2017, 9:52 AM

## 2017-03-01 NOTE — Progress Notes (Signed)
Pt discharge education provided at bedside. Pt IV discontinued, catheter intact and telemetry removed. Pt has all belongings. Pt discharged via wheelchair with nurse staff

## 2017-03-01 NOTE — Discharge Summary (Signed)
Physician Discharge Summary  Morgan Clay IFO:[id number] DOB: 1945/07/13 DOA: 02/22/2017  PCP: [name], DO  Admit date: 02/22/2017 Discharge date: 03/01/2017  Admitted From: Home Disposition:  Home  Recommendations for Outpatient Follow-up:  1. Follow up with PCP in 1 week with repeat BMP 2. Follow-up with cardiology/Dr. Caryl Comes in 1-2 weeks 3. Follow-up with pulmonary in 2-3 weeks Follow-up in the ED if symptoms worsen or new appear  Home Health: No  Equipment/Devices: None  Discharge Condition: Stable  CODE STATUS: Full  Diet recommendation: Heart Healthy / fluid restriction up to 1200 mL a day   Brief/Interim Summary: 71 year old female with history of nonischemic cardiomyopathy with EF of 45-50% (01/14/16), atrial flutter, status post AICD and biventricular pacemaker, hypothyroidism, chronic kidney disease stage III was sent to the ER but patient's cardiologist due to atrial flutter in the office and 10 pounds weight gain from acute fluid overload. She was started on intravenous Lasix. She had DC cardioversion on 02/26/2017. Pulmonary was consulted for wheezing recommended outpatient follow-up. Patient has been switched to oral Lasix and cardiology has cleared the patient for discharge.  Discharge Diagnoses:  Principal Problem:   Acute on chronic systolic CHF (congestive heart failure) (HCC) Active Problems:   Hypothyroidism   OBSTRUCTIVE SLEEP APNEA   Biventricular ICD (implantable cardioverter-defibrillator) in place   Nonischemic cardiomyopathy (HCC)   Type 2 diabetes, controlled, with renal manifestation (HCC)   Atypical atrial flutter (HCC)   CHF (congestive heart failure) (HCC)   Essential hypertension   Cough   Wheezes   Chronic obstructive pulmonary disease (HCC)  Acute on chronic systolic CHF (congestive heart failure)  - Treated with intravenous Lasix intermittently. Negative fluid balance of 2388.4 mL since admission. Lasix dose was held yesterday  because of slight bump in creatinine. - Cardiology has cleared the patient for discharge on Lasix 60 mg daily. Outpatient follow-up with cardiology - echo 02/24/17 EF 45-50% -Continue BIDIL, entresto, aldactone, coreg  Atrial flutter s/p DC cardioversion -Currently in sinus rhythm. Continue Eliquis  AKI on CKD stage 3 -Creatinine 1.64 to today.  - Outpatient follow-up with repeat BMP in a week.  Cough with wheezing -Probably secondary to pulmonary edema. Pulmonary evaluation appreciated. Solu-Medrol discontinued. Atrovent discontinued by pulmonary. Outpatient follow-up with pulmonary.   - No infectious process  Hyponatremia - Improved. Outpatient follow-up  Hypothyroidism - Continue levothyroxine  Biventricular ICD (implantable cardioverter-defibrillator) in place - cardiology will follow up as outpt  Essential hypertension -Monitor blood pressure. Continue antihypertensives and outpatient follow-up  Chronic depression/anxiety -stable -Continue Zoloft, trazodone  Reported hx OSA -Outpatient follow-up with pulmonary  Discharge Instructions  Discharge Instructions    (HEART FAILURE PATIENTS) Call MD:  Anytime you have any of the following symptoms: 1) 3 pound weight gain in 24 hours or 5 pounds in 1 week 2) shortness of breath, with or without a dry hacking cough 3) swelling in the hands, feet or stomach 4) if you have to sleep on extra pillows at night in order to breathe.    Complete by:  As directed    Ambulatory referral to Cardiology    Complete by:  As directed    Follow up in 1-2 weeks   Call MD for:  difficulty breathing, headache or visual disturbances    Complete by:  As directed    Call MD for:  extreme fatigue    Complete by:  As directed    Call MD for:  hives    Complete by:  As directed  Call MD for:  persistant dizziness or light-headedness    Complete by:  As directed    Call MD for:  persistant nausea and vomiting    Complete by:  As  directed    Call MD for:  severe uncontrolled pain    Complete by:  As directed    Call MD for:  temperature >100.4    Complete by:  As directed    Diet - low sodium heart healthy    Complete by:  As directed    Increase activity slowly    Complete by:  As directed      Allergies as of 03/01/2017   No Known Allergies     Medication List    TAKE these medications   albuterol 108 (90 Base) MCG/ACT inhaler Commonly known as:  PROVENTIL HFA;VENTOLIN HFA Inhale 2 puffs into the lungs every 6 (six) hours as needed for wheezing or shortness of breath.   apixaban 5 MG Tabs tablet Commonly known as:  ELIQUIS Take 1 tablet (5 mg total) by mouth 2 (two) times daily.   BIDIL 20-37.5 MG tablet Generic drug:  isosorbide-hydrALAZINE TAKE ONE TABLET BY MOUTH TWICE DAILY   carvedilol 25 MG tablet Commonly known as:  COREG Take 1 tablet (25 mg total) by mouth 2 (two) times daily with a meal.   ENTRESTO 97-103 MG Generic drug:  sacubitril-valsartan TAKE ONE TABLET BY MOUTH TWICE DAILY **TITRATED  DOSE  UP**   fluticasone 50 MCG/ACT nasal spray Commonly known as:  FLONASE Place 2 sprays into both nostrils daily.   furosemide 40 MG tablet Commonly known as:  LASIX Take 1.5 tablets (60 mg total) by mouth daily. What changed:  how much to take  how to take this  when to take this  additional instructions   levothyroxine 137 MCG tablet Commonly known as:  SYNTHROID, LEVOTHROID TAKE 1 TABLET BY MOUTH ONCE DAILY BEFORE BREAKFAST   nitroGLYCERIN 0.4 MG SL tablet Commonly known as:  NITROSTAT Place 0.4 mg under the tongue every 5 (five) minutes as needed for chest pain.   sertraline 100 MG tablet Commonly known as:  ZOLOFT Take 1 tablet (100 mg total) by mouth daily.   spironolactone 25 MG tablet Commonly known as:  ALDACTONE Take 1 tablet (25 mg total) by mouth daily.   traMADol 50 MG tablet Commonly known as:  ULTRAM 1/2 to 1 tablet as needed for severe pain not more  then twice daily. What changed:  how much to take  how to take this  when to take this  reasons to take this  additional instructions   traZODone 50 MG tablet Commonly known as:  DESYREL Take 1 tablet (50 mg total) by mouth at bedtime.      Follow-up Information    [name], DO. Schedule an appointment as soon as possible for a visit in 1 week(s).   Specialty:  Family Medicine Why:  with repeat BMP Contact information: [place] [address] [phone]        [name], MD. Schedule an appointment as soon as possible for a visit in 1 week(s).   Specialty:  Cardiology Contact information: [address] [place] [address] [phone]          No Known Allergies  Consultations:  Cardiology and pulmonary   Procedures/Studies: Dg Chest 2 View  Result Date: 02/22/2017 CLINICAL DATA:  Fluid overload. EXAM: CHEST  2 VIEW COMPARISON:  Chest x-ray  dated Oct 01, 2015. FINDINGS: Stable cardiomegaly. Unchanged left chest wall AICD. Lower lobe predominant interstitial markings. No focal consolidation, pleural effusion, or pneumothorax. No acute osseous abnormality. IMPRESSION: Cardiomegaly and mild pulmonary vascular congestion, unchanged. Electronically Signed   By: Titus Dubin M.D.   On: 02/22/2017 18:55   Dg Chest Port 1 View  Result Date: 02/28/2017 CLINICAL DATA:  Shortness of breath EXAM: PORTABLE CHEST 1 VIEW COMPARISON:  02/26/2017 FINDINGS: Pacemaker/ AICD has a good appearance. Chronic cardiomegaly and aortic atherosclerosis. Abnormal patchy density in both lower lobes worrisome for pneumonia, or possibly residual edema. No dense consolidation or lobar collapse. No effusions. Bony structures are unremarkable. IMPRESSION: Cardiomegaly. Patchy density in both lower lungs that could represent mild pneumonia or residual edema. Electronically Signed   By: Nelson Chimes M.D.   On: 02/28/2017 11:16   Dg Chest  Port 1 View  Result Date: 02/26/2017 CLINICAL DATA:  Cough and worsening shortness of breath. EXAM: PORTABLE CHEST 1 VIEW COMPARISON:  Chest x-ray dated March 04, 2017. FINDINGS: Left chest wall AICD in unchanged position. Mild cardiomegaly, unchanged. Persistent mild pulmonary vascular congestion with slightly improved interstitial opacities. No focal consolidation, pleural effusion, or pneumothorax. No acute osseous abnormality. IMPRESSION: Stable cardiomegaly and pulmonary vascular congestion with slightly improved interstitial edema. Electronically Signed   By: Titus Dubin M.D.   On: 02/26/2017 09:42   Dg Chest Port 1 View  Result Date: 02/24/2017 CLINICAL DATA:  Productive cough for 1 week. Acute shortness of breath today. EXAM: PORTABLE CHEST 1 VIEW COMPARISON:  02/22/2017 and prior radiograph FINDINGS: Cardiomegaly and left pacemaker/ ICD again noted. Pulmonary vascular congestion and mild interstitial opacities/edema noted. No pleural effusion or pneumothorax. No airspace disease or acute bony abnormality is identified. IMPRESSION: Cardiomegaly with mild pulmonary vascular congestion and mild interstitial opacities which may represent mild interstitial edema. Electronically Signed   By: Margarette Canada M.D.   On: 02/24/2017 11:54    : DC cardioversion on 02/26/2017  Echo on 02/24/2017: - Left ventricle: The cavity size was normal. There was moderate concentric hypertrophy. Systolic function was mildly reduced. The estimated ejection fraction was in the range of 45% to 50%. Wall motion was normal; there were no regional wall motion abnormalities. The study is not technically sufficient to allow evaluation of LV diastolic function. Doppler parameters are consistent with high ventricular filling pressure. - Aortic valve: Transvalvular velocity was within the normal range. There was no stenosis. There was moderate regurgitation. - Mitral valve: Mildly calcified annulus.  Mildly thickened leaflets . Transvalvular velocity was within the normal range. There was no evidence for stenosis. There was mild regurgitation. - Left atrium: The atrium was severely dilated. - Right ventricle: The cavity size was normal. Wall thickness was normal. Systolic function was normal. - Tricuspid valve: There was severe regurgitation. - Pulmonary arteries: Systolic pressure was mildly increased. PA peak pressure: 47 mm Hg (S).   Subjective: Patient seen and examined at bedside. She feels much better and wants to go home. No overnight fever, nausea, vomiting or chest pain.  Discharge Exam: Vitals:   02/28/17 2144 03/01/17 0427  BP: (!) 147/53 (!) 122/57  Pulse: (!) 58 (!) 59  Resp: 18 20  Temp: 98.3 F (36.8 C) 98 F (36.7 C)  SpO2: 100% 100%   Vitals:   02/28/17 0620 02/28/17 1200 02/28/17 2144 03/01/17 0427  BP: (!) 193/65 (!) 120/48 (!) 147/53 (!) 122/57  Pulse:  65 (!) 58 (!) 59  Resp:  18 18  20  Temp:  98 F (36.7 C) 98.3 F (36.8 C) 98 F (36.7 C)  TempSrc:  Oral Oral Oral  SpO2:  98% 100% 100%  Weight:    72 kg (158 lb 12.8 oz)  Height:        General: Pt is alert, awake, not in acute distress Cardiovascular: Mild bradycardia, S1/S2 + Respiratory: Bilateral decreased breath sounds at bases Abdominal: Soft, NT, ND, bowel sounds + Extremities: 1+ edema, no cyanosis    The results of significant diagnostics from this hospitalization (including imaging, microbiology, ancillary and laboratory) are listed below for reference.     Microbiology: No results found for this or any previous visit (from the past 240 hour(s)).   Labs: BNP (last 3 results)  Recent Labs  02/22/17 2126  BNP 1,610.9*   Basic Metabolic Panel:  Recent Labs Lab 02/25/17 0701 02/25/17 1154 02/26/17 0532 02/27/17 0514 02/28/17 0538 03/01/17 0301  NA 133* 132* 130* 135 134* 137  K 3.7 3.8 3.7 3.6 4.2 3.7  CL 101 101 98* 103 102 102  CO2 22 23 21* 22 21* 25   GLUCOSE 157* 128* 107* 92 145* 127*  BUN 27* 28* 31* 33* 40* 39*  CREATININE 1.84* 1.85* 1.85* 1.69* 1.82* 1.64*  CALCIUM 8.9 8.8* 8.8* 8.9 9.3 8.8*  MG 2.0  --   --   --   --  2.2   Liver Function Tests: No results for input(s): AST, ALT, ALKPHOS, BILITOT, PROT, ALBUMIN in the last 168 hours. No results for input(s): LIPASE, AMYLASE in the last 168 hours. No results for input(s): AMMONIA in the last 168 hours. CBC:  Recent Labs Lab 02/23/17 0708 02/24/17 0505 02/25/17 0701 02/26/17 0532 02/28/17 0538  WBC 5.4 5.4 4.6 5.6 5.0  NEUTROABS  --  2.8 2.5 2.5  --   HGB 11.1* 11.7* 11.9* 12.3 12.1  HCT 34.5* 35.0* 36.3 36.9 36.9  MCV 86.9 86.6 86.2 86.6 85.6  PLT 151 164 161 160 186   Cardiac Enzymes:  Recent Labs Lab 02/22/17 1817 02/23/17 0104 02/23/17 0708 02/23/17 1226  TROPONINI 0.04* 0.05* 0.05* 0.04*   BNP: Invalid input(s): POCBNP CBG: No results for input(s): GLUCAP in the last 168 hours. D-Dimer No results for input(s): DDIMER in the last 72 hours. Hgb A1c No results for input(s): HGBA1C in the last 72 hours. Lipid Profile No results for input(s): CHOL, HDL, LDLCALC, TRIG, CHOLHDL, LDLDIRECT in the last 72 hours. Thyroid function studies No results for input(s): TSH, T4TOTAL, T3FREE, THYROIDAB in the last 72 hours.  Invalid input(s): FREET3 Anemia work up No results for input(s): VITAMINB12, FOLATE, FERRITIN, TIBC, IRON, RETICCTPCT in the last 72 hours. Urinalysis    Component Value Date/Time   COLORURINE YELLOW 10/02/2015 0330   APPEARANCEUR CLEAR 10/02/2015 0330   LABSPEC 1.016 10/02/2015 0330   PHURINE 5.5 10/02/2015 0330   GLUCOSEU NEGATIVE 10/02/2015 0330   HGBUR TRACE (A) 10/02/2015 0330   HGBUR negative 02/28/2008 0000   BILIRUBINUR NEGATIVE 10/02/2015 0330   KETONESUR NEGATIVE 10/02/2015 0330   PROTEINUR >300 (A) 10/02/2015 0330   UROBILINOGEN 1.0 07/19/2010 1752   NITRITE NEGATIVE 10/02/2015 0330   LEUKOCYTESUR NEGATIVE 10/02/2015 0330    Sepsis Labs Invalid input(s): PROCALCITONIN,  WBC,  LACTICIDVEN Microbiology No results found for this or any previous visit (from the past 240 hour(s)).   Time coordinating discharge: 35 minutes  SIGNED:   Aline August, MD  [facility] 03/01/2017, 11:10 AM Pager: [phone]  If 7PM-7AM, please contact  night-coverage [email or web address] Password [email or web address]

## 2017-03-01 NOTE — Progress Notes (Signed)
Progress Note  Patient Name: Genavive Kubicki Date of Encounter: 03/01/2017  Primary Cardiologist: Caryl Comes  Subjective   Breathing better, no chest pain and denies SOB congested cough continues  Inpatient Medications    Scheduled Meds: . apixaban  5 mg Oral BID  . carvedilol  25 mg Oral BID WC  . fluticasone  2 spray Each Nare Daily  . isosorbide-hydrALAZINE  1 tablet Oral BID  . levothyroxine  137 mcg Oral QAC breakfast  . sacubitril-valsartan  1 tablet Oral BID  . sertraline  100 mg Oral Daily  . sodium chloride flush  3 mL Intravenous Q12H  . sodium chloride flush  3 mL Intravenous Q12H  . spironolactone  25 mg Oral Daily  . traZODone  50 mg Oral QHS   Continuous Infusions: . sodium chloride 250 mL (02/26/17 1000)  . sodium chloride 250 mL (02/26/17 1000)   PRN Meds: acetaminophen **OR** acetaminophen, guaiFENesin-dextromethorphan, hydrALAZINE, nitroGLYCERIN, ondansetron **OR** ondansetron (ZOFRAN) IV, sodium chloride flush, sodium chloride flush, traMADol   Vital Signs    Vitals:   02/28/17 0620 02/28/17 1200 02/28/17 2144 03/01/17 0427  BP: (!) 193/65 (!) 120/48 (!) 147/53 (!) 122/57  Pulse:  65 (!) 58 (!) 59  Resp:  18 18 20   Temp:  98 F (36.7 C) 98.3 F (36.8 C) 98 F (36.7 C)  TempSrc:  Oral Oral Oral  SpO2:  98% 100% 100%  Weight:    158 lb 12.8 oz (72 kg)  Height:        Intake/Output Summary (Last 24 hours) at 03/01/17 0827 Last data filed at 03/01/17 5885  Gross per 24 hour  Intake              960 ml  Output             1900 ml  Net             -940 ml   Filed Weights   02/26/17 0524 02/28/17 0512 03/01/17 0427  Weight: 151 lb 11.2 oz (68.8 kg) 151 lb 6.4 oz (68.7 kg) 158 lb 12.8 oz (72 kg)    Telemetry    AV pacing - Personally Reviewed  ECG    No new - Personally Reviewed  Physical Exam   GEN: No acute distress.   Neck: No JVD Cardiac: RRR, no murmurs, rubs, or gallops.  Respiratory: bilateral breath sounds to auscultation  bilaterally, occ wheeze, diminished in bases but otherwise improved.[name] GI: Soft, nontender, non-distended  MS: No edema; No deformity. Neuro:  Nonfocal  Psych: Normal affect   Labs    Chemistry Recent Labs Lab 02/27/17 0514 02/28/17 0538 03/01/17 0301  NA 135 134* 137  K 3.6 4.2 3.7  CL 103 102 102  CO2 22 21* 25  GLUCOSE 92 145* 127*  BUN 33* 40* 39*  CREATININE 1.69* 1.82* 1.64*  CALCIUM 8.9 9.3 8.8*  GFRNONAA 30* 27* 31*  GFRAA 35* 32* 36*  ANIONGAP 10 11 10      Hematology Recent Labs Lab 02/25/17 0701 02/26/17 0532 02/28/17 0538  WBC 4.6 5.6 5.0  RBC 4.21 4.26 4.31  HGB 11.9* 12.3 12.1  HCT 36.3 36.9 36.9  MCV 86.2 86.6 85.6  MCH 28.3 28.9 28.1  MCHC 32.8 33.3 32.8  RDW 15.0 15.0 14.7  PLT 161 160 186    Cardiac Enzymes Recent Labs Lab 02/22/17 1817 02/23/17 0104 02/23/17 0708 02/23/17 1226  TROPONINI 0.04* 0.05* 0.05* 0.04*    Recent Labs Lab 02/22/17 1845  TROPIPOC 0.03     BNP Recent Labs Lab 02/22/17 2126  BNP 1,275.8*     DDimer No results for input(s): DDIMER in the last 168 hours.   Radiology    Dg Chest Port 1 View  Result Date: 02/28/2017 CLINICAL DATA:  Shortness of breath EXAM: PORTABLE CHEST 1 VIEW COMPARISON:  02/26/2017 FINDINGS: Pacemaker/ AICD has a good appearance. Chronic cardiomegaly and aortic atherosclerosis. Abnormal patchy density in both lower lobes worrisome for pneumonia, or possibly residual edema. No dense consolidation or lobar collapse. No effusions. Bony structures are unremarkable. IMPRESSION: Cardiomegaly. Patchy density in both lower lungs that could represent mild pneumonia or residual edema. Electronically Signed   By: Nelson Chimes M.D.   On: 02/28/2017 11:16    Cardiac Studies   02/24/17 Echo Study Conclusions  - Left ventricle: The cavity size was normal. There was moderate   concentric hypertrophy. Systolic function was mildly reduced. The   estimated ejection fraction was in the range of 45%  to 50%. Wall   motion was normal; there were no regional wall motion   abnormalities. The study is not technically sufficient to allow   evaluation of LV diastolic function. Doppler parameters are   consistent with high ventricular filling pressure. - Aortic valve: Transvalvular velocity was within the normal range.   There was no stenosis. There was moderate regurgitation. - Mitral valve: Mildly calcified annulus. Mildly thickened leaflets   . Transvalvular velocity was within the normal range. There was   no evidence for stenosis. There was mild regurgitation. - Left atrium: The atrium was severely dilated. - Right ventricle: The cavity size was normal. Wall thickness was   normal. Systolic function was normal. - Tricuspid valve: There was severe regurgitation. - Pulmonary arteries: Systolic pressure was mildly increased. PA   peak pressure: 47 mm Hg (S).  Cardioversion 02/26/17 EP Procedure Note  Preoperative diagnosis: atrial flutter, atypical  Postoperative diagnosis: atrial flutter, atypical  Procedure Performed: DCCV  Description of the procedure: after informed consent was obtained, the patient was prepped in the usual manner. The electrodispersive pads were placed in the AP position. The patient was sedated under my direct supervision with IV versed (6 mg) and fentanyl (75 mcg).  360 Joules of synchronized biphasic energy was delivered, restoring NSR. She tolerated the procedure well. The patient's ICD was reprogrammed to a lower rate of 60/min.   Patient Profile     71 y.o. female with hx nonischemic CM, HFrEF (45%), s/p Bi-ventricular pacer/ICD, obesity, OSA, AFL, and CKD p/w SOB. Patient found to be in recurrent atrial flutter. She was admitted for IV diuresis cardioversion 02/26/17 now AV pacing SR.      Assessment & Plan    1.  atypical  Atrial flutter   Underwent D/C cardioversion 02/26/17  Reamins in SR  Continue eliquis  2  Acute on cronic systolic and  diastolic CHF  Still with some volume increase on exam   Cr is sl better  1.64   Improved from yesterday at 1.82 I&O    neg 2388 since admit while wt increases. --ambulates without SOB.  --CXR 02/28/17  Cardiomegaly. Patchy density in both lower lungs that could represent mild pneumonia or residual edema.   3 CKD stage 3  Follow stable improved from yesterday  4  COPD    pulm is following   5. NIDCM with chronic LBBB--has BiVICD --EF 2017 by echo 45-50%  Continues the same, mod LVH, PA pk pressure 47 mmHg -gen  change was last year  6.  HTN stable improved from the 23rd   For questions or updates, please contact [facility] Please consult [email or web address] for contact info under Cardiology/STEMI.      Signed, Cecilie Kicks, NP  03/01/2017, 8:27 AM    Pt seen and examined  I agree with findings as noted above by L [place] Pt comfortable  Walked some  ON exam:  Cardiac RRR  No S3  Lungs with mild wheeze  IMproved  Ext without signif edema  Tele with SR  OK to d/c from cardaic standpoint  I would recomm ath she use lasix 60 mg per day (once)  I will make sure pt has f/u appt in clinic soon for labs and exam.  Dorris Carnes

## 2017-03-01 NOTE — Progress Notes (Signed)
Rt placed pt on cpap with a pressure of 8. Size M full faced mask and 0L bled through. Pt tolerating well and educated pt to call if needed anything. RT to cont to monitor as needed.

## 2017-03-01 NOTE — Discharge Instructions (Signed)
Have lab work done on Tuesday the 30th to check your kidneys at Dr. Olin Pia office on 3 rd floor.  Better to come in the AM

## 2017-03-02 ENCOUNTER — Telehealth: Payer: Self-pay

## 2017-03-02 ENCOUNTER — Other Ambulatory Visit: Payer: Self-pay

## 2017-03-02 MED ORDER — CARVEDILOL 25 MG PO TABS: 25.0000 mg | ORAL_TABLET | Freq: Two times a day (BID) | ORAL | 3 refills | Status: DC

## 2017-03-02 NOTE — Telephone Encounter (Signed)
Admit date: 02/22/2017 Discharge date: 03/01/2017 Admitted From: Home Disposition:  Home Recommendations for Outpatient Follow-up:  1. Follow up with PCP in 1 week with repeat BMP 2. Follow-up with cardiology/Dr. Caryl Comes in 1-2 weeks 3. Follow-up with pulmonary in 2-3 weeks Follow-up in the ED if symptoms worsen or new appear Home Health: No  Equipment/Devices: None  Discharge Condition: Stable  CODE STATUS: Full  Diet recommendation: Heart Healthy / fluid restriction up to 1200 mL a day   Spoke with pt and she states that she is doing well. She is following all recommendations and monitoring her fluid intake. She denies any additional weight/fluid gain. She has picked up her new medications. She has no questions or concerns at this time.   Transition Care Management Follow-up Telephone Call   Date discharged? 03/01/17   How have you been since you were released from the hospital? Doing well   Do you understand why you were in the hospital? yes   Do you understand the discharge instructions? yes   Where were you discharged to? home   Items Reviewed:  Medications reviewed: yes  Allergies reviewed: yes  Dietary changes reviewed: yes, pt states she is following   Referrals reviewed: yes   Functional Questionnaire:   Activities of Daily Living (ADLs):   She states they are independent in the following: ambulation, bathing and hygiene, feeding, continence, grooming, toileting and dressing States they require assistance with the following: none   Any transportation issues/concerns?: no   Any patient concerns? no   Confirmed importance and date/time of follow-up visits scheduled yes  Provider Appointment booked with Dr. [name] 03/06/17  Confirmed with patient if condition begins to worsen call PCP or go to the ER.  Patient was given the office number and encouraged to call back with question or concerns.  : yes

## 2017-03-06 ENCOUNTER — Other Ambulatory Visit: Payer: Medicare Other | Admitting: *Deleted

## 2017-03-06 ENCOUNTER — Ambulatory Visit (INDEPENDENT_AMBULATORY_CARE_PROVIDER_SITE_OTHER): Payer: Medicare Other | Admitting: Family Medicine

## 2017-03-06 ENCOUNTER — Encounter: Payer: Self-pay | Admitting: Family Medicine

## 2017-03-06 VITALS — BP 142/60 | HR 60 | Temp 98.0°F | Ht 59.0 in | Wt 153.2 lb

## 2017-03-06 DIAGNOSIS — I484 Atypical atrial flutter: Secondary | ICD-10-CM

## 2017-03-06 DIAGNOSIS — E1122 Type 2 diabetes mellitus with diabetic chronic kidney disease: Secondary | ICD-10-CM | POA: Diagnosis not present

## 2017-03-06 DIAGNOSIS — N183 Chronic kidney disease, stage 3 unspecified: Secondary | ICD-10-CM

## 2017-03-06 DIAGNOSIS — F3341 Major depressive disorder, recurrent, in partial remission: Secondary | ICD-10-CM | POA: Diagnosis not present

## 2017-03-06 DIAGNOSIS — I428 Other cardiomyopathies: Secondary | ICD-10-CM

## 2017-03-06 DIAGNOSIS — I509 Heart failure, unspecified: Secondary | ICD-10-CM | POA: Diagnosis not present

## 2017-03-06 DIAGNOSIS — J449 Chronic obstructive pulmonary disease, unspecified: Secondary | ICD-10-CM | POA: Diagnosis not present

## 2017-03-06 DIAGNOSIS — I5043 Acute on chronic combined systolic (congestive) and diastolic (congestive) heart failure: Secondary | ICD-10-CM | POA: Diagnosis not present

## 2017-03-06 LAB — BASIC METABOLIC PANEL
BUN/Creatinine Ratio: 18 (ref 12–28)
BUN: 28 mg/dL — ABNORMAL HIGH (ref 8–27)
CALCIUM: 9.3 mg/dL (ref 8.7–10.3)
CO2: 24 mmol/L (ref 20–29)
CREATININE: 1.53 mg/dL — AB (ref 0.57–1.00)
Chloride: 101 mmol/L (ref 96–106)
GFR, EST AFRICAN AMERICAN: 40 mL/min/{1.73_m2} — AB (ref >59)
GFR, EST NON AFRICAN AMERICAN: 34 mL/min/{1.73_m2} — AB (ref >59)
Glucose: 129 mg/dL — ABNORMAL HIGH (ref 65–99)
POTASSIUM: 4.2 mmol/L (ref 3.5–5.2)
SODIUM: 140 mmol/L (ref 134–144)

## 2017-03-06 NOTE — Progress Notes (Addendum)
HPI:  Morgan Clay is a pleasant 71 y.o. with a PMH significant for nonischemic cardiomyopathy, atrial flutter, heart failure, hypothyroidism, Depression, diabetes, resistant hypertension,chronic kidney disease and COPD here for a hospital follow up. See transitional care phone note in Epic. Per review of discharge documents and patient: Hospitalized 10/18 to 03/01/17  Primary admitting complaint(s):  -Past medical history nonischemic cardiomyopathy, atrial flutter, status post AICD and biventricular pacemaker, hypothyroidism, chronic kidney disease -Went to hospital for 10 pound weight gain and respiratory symptoms  Primary admitting diagnosis (es) and treatment: -principal diagnosis was acute on chronic systolic CHF -treated with IV Lasix, cardiology consult, had repeat echo -Is to follow-up with her cardiologist outpatient -oupt discharge dose lasix was 60mg  daily  Other significant diagnosis (es) and treatment: -slight bump in creatinine per discharge documents with diuresis -creatinine 1.64 on discharge per notes, repeat check in one week was advised, baseline prior to admission was around 1.2 -She was seen by pulmonary this admission for wheezing was felt to have pulmonary edema - she is to follow-up with her pulmonologist outpatient  Follow up concerns per discharge document: Reports today:she is doing okay overall. She did run out of her Zoloft a few days ago. She does have some depressed mood, but feels is coping okay.denies thoughts of self-harm or suicidal ideation, or severe symptoms. She continues to have mild shortness of breath that is unchanged since discharge. Mild fatigue, unchanged since discharge. She reports her weight has been stable and she has had no swelling in the legs or increased shortness of breath since discharge. Appetite is okay. She reports she has follow-up appointments with her cardiologist and her pulmonologist. She reports she did go to the cardiology  lab today to recheck her labs. On review of chart it looks like a BMP was ordered to recheck her kidney function.Results are not posted.    ROS: See pertinent positives and negatives per HPI.  Past Medical History:  Diagnosis Date  . AICD (automatic cardioverter/defibrillator) present   . Arthritis   . Asthma    reports mild asthma since childhood - had COPD on dx list from prior PCP  . Atypical atrial flutter ([place]) 09/28/2015  . Biventricular implantable cardiac defibrillator in situ 12/24/2006   Protecta- Medtronic  ICD gen change 2012  . Chronic systolic congestive heart failure ([place])   . DEPRESSION 12/17/2009   Annotation: PHQ-9 score = 14 done on 12/17/2009 Qualifier: Diagnosis of  By: Hassell Done FNP, Tori Milks    . FIBROIDS, UTERUS 03/05/2008   Qualifier: Diagnosis of  By: Isla Pence    . Glaucoma   . Hepatitis C 01/04/2007   Qualifier: Diagnosis of  By: Hassell Done FNP, Tori Milks    . HEPATITIS C - s/p treatment with Harvoni, seeing hepatology, Dawn Drazek  01/04/2007   Qualifier: Diagnosis of  By: Hassell Done FNP, Tori Milks    . Hx of cardiovascular stress test    Lexiscan Myoview (12/15): No ischemia, EF 33%, high risk due to depressed LV function   . Hx of echocardiogram    Echo (12/15): Severe LVH, EF 25-30%, diffuse HK, restrictive physiology, mild AI, mild MR, moderate LAE, mild RVE, mild RAE, moderate to severe TR, mild increased PASP  . Hyperlipemia 12/06/2012  . HYPERTENSION, BENIGN 04/24/2007   Qualifier: Diagnosis of  By: Hassell Done FNP, Tori Milks    . Hypothyroidism   . LBBB (left bundle branch block)   . Nonischemic cardiomyopathy ([place])    Followed by [facility]  . OBESITY 05/27/2009  Qualifier: Diagnosis of  By: Hassell Done FNP, Tori Milks    . OBSTRUCTIVE SLEEP APNEA 11/14/2007   npsg 2009:  Mild osa with AHI 11/hr. Started cpap 2009, quit using due to lack of response Home sleep testing 09/2010 with AHI only 7/hr and weight loss advised by pulmonology.  . OSTEOPENIA 09/30/2008    Qualifier: Diagnosis of  By: Hassell Done FNP, Tori Milks    . Personal history of other infectious and parasitic disease    Hepatitis B    Past Surgical History:  Procedure Laterality Date  . appendectomy    . CARDIAC CATHETERIZATION    . CARDIOVERSION N/A 12/14/2014   Procedure: CARDIOVERSION;  Surgeon: Dorothy Spark, MD;  Location: [facility] ENDOSCOPY;  Service: Cardiovascular;  Laterality: N/A;  . CARDIOVERSION N/A 02/26/2017   Procedure: CARDIOVERSION;  Surgeon: Evans Lance, MD;  Location: [place];  Service: Cardiovascular;  Laterality: N/A;  . EP IMPLANTABLE DEVICE N/A 10/06/2015   Procedure: BIV ICD Generator Changeout;  Surgeon: [name], MD;  Location: [place] [facility];  Service: Cardiovascular;  Laterality: N/A;  . INSERT / REPLACE / REMOVE PACEMAKER     concerto  . Y19.50     Concerto [id number]    Family History  Problem Relation Age of Onset  . Asthma Father   . Heart attack Father   . Asthma Sister   . Cancer Sister        lung  . Heart attack Mother   . Stroke Brother   . Diabetes Unknown     Social History   Social History  . Marital status: Single    Spouse name: N/A  . Number of children: N/A  . Years of education: N/A   Social History Main Topics  . Smoking status: Never Smoker  . Smokeless tobacco: Never Used  . Alcohol use Yes     Comment: 3 glasses of wine 3-4 times per week  . Drug use: No  . Sexual activity: Not Asked   Other Topics Concern  . 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  albuterol (PROVENTIL HFA;VENTOLIN HFA) 108 (90 BASE) MCG/ACT inhaler, Inhale 2 puffs into the lungs every 6 (six) hours as needed for wheezing or shortness of breath., Disp: 1 Inhaler, Rfl: 3 .  apixaban (ELIQUIS) 5 MG TABS tablet,  Take 1 tablet (5 mg total) by mouth 2 (two) times daily., Disp: 180 tablet, Rfl: 3 .  BIDIL 20-37.5 MG tablet, TAKE ONE TABLET BY MOUTH TWICE DAILY, Disp: 60 tablet, Rfl: 0 .  carvedilol (COREG) 25 MG tablet, Take 1 tablet (25 mg total) by mouth 2 (two) times daily., Disp: 180 tablet, Rfl: 3 .  ENTRESTO 97-103 MG, TAKE ONE TABLET BY MOUTH TWICE DAILY **TITRATED  DOSE  UP**, Disp: 60 tablet, Rfl: 0 .  fluticasone (FLONASE) 50 MCG/ACT nasal spray, Place 2 sprays into both nostrils daily., Disp: 16 g, Rfl: 6 .  furosemide (LASIX) 40 MG tablet, Take 1.5 tablets (60 mg total) by mouth daily., Disp: , Rfl:  .  levothyroxine (SYNTHROID, LEVOTHROID) 137 MCG tablet, TAKE 1 TABLET BY MOUTH ONCE DAILY BEFORE BREAKFAST, Disp: 90 tablet, Rfl: 1 .  nitroGLYCERIN (NITROSTAT) 0.4 MG SL tablet, Place 0.4 mg under the tongue every 5 (five) minutes as needed for chest pain., Disp: , Rfl:  .  sertraline (ZOLOFT) 100 MG tablet, Take 1 tablet (100 mg total) by mouth daily., Disp: 90 tablet, Rfl: 1 .  spironolactone (ALDACTONE) 25 MG tablet, Take 1 tablet (25 mg total) by mouth daily., Disp: 30 tablet, Rfl: 0 .  traMADol (ULTRAM) 50 MG tablet, 1/2 to 1 tablet as needed for severe pain not more then twice daily. (Patient taking differently: Take 25-50 mg by mouth every 12 (twelve) hours as needed for moderate pain. ), Disp: 20 tablet, Rfl: 0 .  traZODone (DESYREL) 50 MG tablet, Take 1 tablet (50 mg total) by mouth at bedtime., Disp: 90 tablet, Rfl: 3  EXAM:  Vitals:   03/06/17 1422  BP: (!) 142/60  Pulse: 60  Temp: 98 F (36.7 C)  SpO2: 98%    Body mass index is 30.94 kg/m.  GENERAL: vitals reviewed and listed above, alert, oriented, appears well hydrated and in no acute distress  HEENT: atraumatic, conjunttiva clear, no obvious abnormalities on inspection of external nose and ears  NECK: no obvious masses on inspection  LUNGS: clear to auscultation bilaterally, no wheezes, rales or rhonchi, good air  movement  CV: HRRR, no peripheral edema  MS: moves all extremities without noticeable abnormality  PSYCH: pleasant and cooperative, no obvious depression or anxiety  ASSESSMENT AND PLAN:  Discussed the following assessment and plan:  Recurrent major depressive disorder, in partial remission ([place]) -see depression screening in chart -refilled Zoloft -Advised follow-up if any worsening or not improving back on the medication  Acute congestive heart failure, unspecified heart failure type (HCC) Nonischemic cardiomyopathy (HCC) Atypical atrial flutter (HCC) -sees cardiology for management -Seems to be doing well on exam today -Systolic blood pressure very mildly elevated, she will recheck at her cardiology appointment -Taking 1.5 tablets of her Lasix daily -Looks like she had a BMP earlier today elsewhere  Stage 3 chronic kidney disease ([place]) -Sees a nephrologist for management, BMP is pending  Controlled type 2 diabetes mellitus with chronic kidney disease, without long-term current use of insulin, unspecified CKD stage (HCC) -last hemoglobin A1c 6.0 -Continue current management, lifestyle recommendations  Chronic obstructive pulmonary disease, unspecified COPD type ([place]) -Has appointment with pulmonologist for follow-up  She did not want to do her flu shot today. She agrees to set up an appointment with Wendie Simmer to do this in a few weeks. Discussed risks and benefits and stress that she get this as soon as possible.  -Patient advised to return or notify a doctor immediately if symptoms worsen or persist or new concerns arise.  Patient Instructions  BEFORE YOU LEAVE: -phq9 -please set her up with a visit with you Wendie Simmer for her flu shot -follow up: 3-4 months  See your cardiologist and pulmonologist as planned.  Eat a healthy diet.Try to eat at least 5-9 servings of vegetables and fruits per day (not corn, potatoes or bananas.) Avoid sweets, red meat, pork, butter, fried  foods, fast food, processed food, excessive dairy, eggs and coconut. Replace bad fats with good fats - fish, nuts and seeds, canola oil, olive oil. Try to get regular gentle exercise. Try to get outside for a little bit every day.  I sent the zoloft refill to your pharmacy.     [name], DO

## 2017-03-06 NOTE — Patient Instructions (Signed)
BEFORE YOU LEAVE: -phq9 -please set her up with a visit with you Wendie Simmer for her flu shot -follow up: 3-4 months  See your cardiologist and pulmonologist as planned.  Eat a healthy diet.Try to eat at least 5-9 servings of vegetables and fruits per day (not corn, potatoes or bananas.) Avoid sweets, red meat, pork, butter, fried foods, fast food, processed food, excessive dairy, eggs and coconut. Replace bad fats with good fats - fish, nuts and seeds, canola oil, olive oil. Try to get regular gentle exercise. Try to get outside for a little bit every day.  I sent the zoloft refill to your pharmacy.

## 2017-03-13 ENCOUNTER — Ambulatory Visit: Payer: Medicare Other | Admitting: Physician Assistant

## 2017-03-19 ENCOUNTER — Telehealth: Payer: Self-pay

## 2017-03-19 ENCOUNTER — Ambulatory Visit: Payer: Medicare Other | Admitting: Family Medicine

## 2017-03-19 NOTE — Telephone Encounter (Signed)
I have done a PA for Entresto through covermymeds.

## 2017-03-20 ENCOUNTER — Encounter: Payer: Self-pay | Admitting: Physician Assistant

## 2017-03-20 ENCOUNTER — Ambulatory Visit: Payer: Medicare Other | Admitting: Physician Assistant

## 2017-03-20 VITALS — BP 126/70 | HR 84 | Ht 59.0 in | Wt 150.8 lb

## 2017-03-20 DIAGNOSIS — I10 Essential (primary) hypertension: Secondary | ICD-10-CM | POA: Diagnosis not present

## 2017-03-20 DIAGNOSIS — I428 Other cardiomyopathies: Secondary | ICD-10-CM

## 2017-03-20 DIAGNOSIS — I5022 Chronic systolic (congestive) heart failure: Secondary | ICD-10-CM

## 2017-03-20 DIAGNOSIS — I484 Atypical atrial flutter: Secondary | ICD-10-CM

## 2017-03-20 MED ORDER — SPIRONOLACTONE 25 MG PO TABS: 25.0000 mg | ORAL_TABLET | Freq: Every day | ORAL | 3 refills | Status: DC

## 2017-03-20 NOTE — Patient Instructions (Addendum)
Medication Instructions:  Your physician recommends that you continue on your current medications as directed. Please refer to the Current Medication list given to you today.  Labwork: NONE  Testing/Procedures: NONE  Follow-Up: Your physician wants you to follow-up in: 3 to 4  months with Dr. Caryl Comes.    If you need a refill on your cardiac medications before your next appointment, please call your pharmacy.

## 2017-03-20 NOTE — Telephone Encounter (Signed)
**Note De-Identified Ahmod Gillespie Obfuscation** Received a letter Sigmund Morera fax from [employer] stating that Delene Loll is on the pts list of covered medications. I have faxed the letter to the pts pharmacy, [facility] to make them aware.

## 2017-03-20 NOTE — Progress Notes (Signed)
Cardiology Office Note    Date:  03/20/2017   ID:  Morgan Clay, DOB [birth year], MRN [id number]  PCP:  [name], DO  Cardiologist: Dr. Caryl Comes  No chief complaint on file.   History of Present Illness:  Morgan Clay is a 71 y.o. female with history of NICM EF 45-50% 02/2017, atrial flutter status post AICD and biventricular pacer cardioversion 8/16, hypothyroidism, CKD stage III.  She was found to be in recurrent atrial flutter, severely hypertensive, and heart failure and admitted for diuresis 02/22/17.  Also underwent cardioversion and remained in normal sinus rhythm.  Is on Eliquis.  Weight 158 pounds at discharge.  Patient comes in today for follow-up.  She feels great.  Her weight is down to 150 pounds.  144 on her scales.  She says she gets a little tired after she he takes spironolactone and Coreg but overall everything has been good.  Follow-up renal function creatinine was 1.53.  No further fluttering, chest pain, dyspnea or edema.    Past Medical History:  Diagnosis Date  . AICD (automatic cardioverter/defibrillator) present   . Arthritis   . Asthma    reports mild asthma since childhood - had COPD on dx list from prior PCP  . Atypical atrial flutter ([place]) 09/28/2015  . Biventricular implantable cardiac defibrillator in situ 12/24/2006   Protecta- Medtronic  ICD gen change 2012  . Chronic systolic congestive heart failure ([place])   . DEPRESSION 12/17/2009   Annotation: PHQ-9 score = 14 done on 12/17/2009 Qualifier: Diagnosis of  By: Hassell Done FNP, Tori Milks    . FIBROIDS, UTERUS 03/05/2008   Qualifier: Diagnosis of  By: Isla Pence    . Glaucoma   . Hepatitis C 01/04/2007   Qualifier: Diagnosis of  By: Hassell Done FNP, Tori Milks    . HEPATITIS C - s/p treatment with Harvoni, seeing hepatology, Dawn Drazek  01/04/2007   Qualifier: Diagnosis of  By: Hassell Done FNP, Tori Milks    . Hx of cardiovascular stress test    Lexiscan Myoview (12/15): No ischemia, EF 33%, high  risk due to depressed LV function   . Hx of echocardiogram    Echo (12/15): Severe LVH, EF 25-30%, diffuse HK, restrictive physiology, mild AI, mild MR, moderate LAE, mild RVE, mild RAE, moderate to severe TR, mild increased PASP  . Hyperlipemia 12/06/2012  . HYPERTENSION, BENIGN 04/24/2007   Qualifier: Diagnosis of  By: Hassell Done FNP, Tori Milks    . Hypothyroidism   . LBBB (left bundle branch block)   . Nonischemic cardiomyopathy ([place])    Followed by [facility]  . OBESITY 05/27/2009   Qualifier: Diagnosis of  By: Hassell Done FNP, Tori Milks    . OBSTRUCTIVE SLEEP APNEA 11/14/2007   npsg 2009:  Mild osa with AHI 11/hr. Started cpap 2009, quit using due to lack of response Home sleep testing 09/2010 with AHI only 7/hr and weight loss advised by pulmonology.  . OSTEOPENIA 09/30/2008   Qualifier: Diagnosis of  By: Hassell Done FNP, Tori Milks    . Personal history of other infectious and parasitic disease    Hepatitis B    Past Surgical History:  Procedure Laterality Date  . appendectomy    . CARDIAC CATHETERIZATION    . INSERT / REPLACE / REMOVE PACEMAKER     concerto  . G25.42     Concerto [id number]    Current Medications: Current Meds  Medication Sig  . albuterol (PROVENTIL HFA;VENTOLIN HFA) 108 (90 BASE) MCG/ACT inhaler Inhale 2 puffs into the lungs every 6 (  six) hours as needed for wheezing or shortness of breath.  [name] apixaban (ELIQUIS) 5 MG TABS tablet Take 1 tablet (5 mg total) by mouth 2 (two) times daily.  [name] BIDIL 20-37.5 MG tablet TAKE ONE TABLET BY MOUTH TWICE DAILY  . carvedilol (COREG) 25 MG tablet Take 1 tablet (25 mg total) by mouth 2 (two) times daily.  [name] ENTRESTO 97-103 MG TAKE ONE TABLET BY MOUTH TWICE DAILY **TITRATED  DOSE  UP**  . fluticasone (FLONASE) 50 MCG/ACT nasal spray Place 2 sprays into both nostrils daily.  . furosemide (LASIX) 40 MG tablet Take 1.5 tablets (60 mg total) by mouth daily.  [name] levothyroxine (SYNTHROID, LEVOTHROID) 137 MCG tablet TAKE 1 TABLET BY MOUTH ONCE DAILY  BEFORE BREAKFAST  . nitroGLYCERIN (NITROSTAT) 0.4 MG SL tablet Place 0.4 mg under the tongue every 5 (five) minutes as needed for chest pain.  [name] sertraline (ZOLOFT) 100 MG tablet Take 1 tablet (100 mg total) by mouth daily.  [name] spironolactone (ALDACTONE) 25 MG tablet Take 1 tablet (25 mg total) by mouth daily.  . traMADol (ULTRAM) 50 MG tablet 1/2 to 1 tablet as needed for severe pain not more then twice daily. (Patient taking differently: Take 25-50 mg by mouth every 12 (twelve) hours as needed for moderate pain. )  . traZODone (DESYREL) 50 MG tablet Take 1 tablet (50 mg total) by mouth at bedtime.     Allergies:   Patient has no known allergies.   Social History   Socioeconomic History  . Marital status: Single    Spouse name: None  . Number of children: None  . Years of education: None  . Highest education level: None  Social Needs  . Financial resource strain: None  . Food insecurity - worry: None  . Food insecurity - inability: None  . Transportation needs - medical: None  . Transportation needs - non-medical: None  Occupational History  . None  Tobacco Use  . Smoking status: Never Smoker  . Smokeless tobacco: Never Used  Substance and Sexual Activity  . Alcohol use: Yes    Comment: 3 glasses of wine 3-4 times per week  . Drug use: No  . Sexual activity: None  Other Topics Concern  . 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   ROS:   Please see the history of present illness.    Review of Systems  Constitution: Negative.  HENT: Negative.   Eyes: Negative.   Cardiovascular: Negative.     Respiratory: Negative.   Hematologic/Lymphatic: Negative.   Musculoskeletal: Negative.  Negative for joint pain.  Gastrointestinal: Negative.   Genitourinary: Negative.   Neurological: Negative.    All other systems reviewed and are negative.   PHYSICAL EXAM:   VS:  BP 126/70   Pulse 84   Ht 4\' 11"  (1.499 m)   Wt 150 lb 12.8 oz (68.4 kg)   SpO2 99%   BMI 30.46 kg/m   Physical Exam  GEN: Well nourished, well developed, in no acute distress  Neck: no JVD, carotid bruits, or masses Cardiac:RRR; 1/6 systolic murmur in the left sternal border Respiratory:  clear to auscultation bilaterally, normal work of breathing GI: soft, nontender, nondistended, + BS Ext: without cyanosis, clubbing, or edema, Good distal pulses bilaterally Neuro:  Alert and Oriented x 3 Psych: euthymic mood, full affect  Wt Readings from Last 3 Encounters:  03/20/17 150 lb 12.8 oz (68.4 kg)  03/06/17 153 lb 3.2 oz (69.5 kg)  03/01/17 158 lb 12.8 oz (72 kg)      Studies/Labs Reviewed:   EKG:  EKG is ordered today.  The ekg ordered today demonstrates atrial sensed ventricular paced  Recent Labs: 02/22/2017: B Natriuretic Peptide 1,275.8 02/23/2017: TSH 1.189 02/28/2017: Hemoglobin 12.1; Platelets 186 03/01/2017: Magnesium 2.2 03/06/2017: BUN 28; Creatinine, Ser 1.53; Potassium 4.2; Sodium 140   Lipid Panel    Component Value Date/Time   CHOL 148 02/25/2016 1607   TRIG 91 02/25/2016 1607   HDL 82 02/25/2016 1607   CHOLHDL 1.8 02/25/2016 1607   VLDL 18 02/25/2016 1607   LDLCALC 48 02/25/2016 1607    Additional studies/ records that were reviewed today include:   02/24/17 Echo Study Conclusions   - Left ventricle: The cavity size was normal. There was moderate   concentric hypertrophy. Systolic function was mildly reduced. The   estimated ejection fraction was in the range of 45% to 50%. Wall   motion was normal; there were no regional wall motion   abnormalities. The study is not  technically sufficient to allow   evaluation of LV diastolic function. Doppler parameters are   consistent with high ventricular filling pressure. - Aortic valve: Transvalvular velocity was within the normal range.   There was no stenosis. There was moderate regurgitation. - Mitral valve: Mildly calcified annulus. Mildly thickened leaflets   . Transvalvular velocity was within the normal range. There was   no evidence for stenosis. There was mild regurgitation. - Left atrium: The atrium was severely dilated. - Right ventricle: The cavity size was normal. Wall thickness was   normal. Systolic function was normal. - Tricuspid valve: There was severe regurgitation. - Pulmonary arteries: Systolic pressure was mildly increased. PA   peak pressure: 47 mm Hg (S).   Cardioversion 02/26/17  EP Procedure Note   Preoperative diagnosis: atrial flutter, atypical   Postoperative diagnosis: atrial flutter, atypical   Procedure Performed: DCCV   Description of the procedure: after informed consent was obtained, the patient was prepped in the usual manner. The electrodispersive pads were placed in the AP position. The patient was sedated under my direct supervision with IV versed (6 mg) and fentanyl (75 mcg).  360 Joules of synchronized biphasic energy was delivered, restoring NSR. She tolerated the procedure well. The patient's ICD was reprogrammed to a lower rate of 60/min.       ASSESSMENT:    1. Nonischemic cardiomyopathy ([place])   2. Chronic systolic heart failure ([place])   3. Atypical atrial flutter ([place])   4. Resistant hypertension      PLAN:  In order of problems listed above:  Nonischemic cardiomyopathy ejection fraction 45-50% on echo 02/24/17 as well as elevated PA pressure, moderate AI.  Chronic systolic CHF secondary to cardiomyopathy as well as atrial flutter, currently compensated today.  Continue Entresto, spironolactone, Coreg, Lasix and BiDil.  Renal function recently checked  and stable with creatinine 1.53.  Follow-up with Dr. Caryl Comes in 2 months.  Atypical atrial flutter status post cardioversion doing well and in normal sinus rhythm.  Resistant hypertension blood pressure well controlled with the addition of Spironolactone.  Medication Adjustments/Labs and Tests Ordered: Current medicines are reviewed at length with the patient today.  Concerns regarding medicines are outlined above.  Medication changes, Labs and Tests ordered today are listed in the Patient Instructions below. Patient Instructions  Medication Instructions:  Your physician recommends that you continue on your current medications as directed. Please refer to the Current Medication list given to you today.  Labwork: NONE  Testing/Procedures: NONE  Follow-Up: Your physician wants you to follow-up in: 3 to 4  months with Dr. Caryl Comes.    If you need a refill on your cardiac medications before your next appointment, please call your pharmacy.       Signed, Ermalinda Barrios, PA-C  03/20/2017 1:26 PM    [facility] [place], [place], [place]  [address] Phone: [phone]; Fax: [phone]

## 2017-03-22 ENCOUNTER — Ambulatory Visit (INDEPENDENT_AMBULATORY_CARE_PROVIDER_SITE_OTHER): Payer: Medicare Other | Admitting: Adult Health

## 2017-03-22 ENCOUNTER — Encounter: Payer: Self-pay | Admitting: Adult Health

## 2017-03-22 ENCOUNTER — Ambulatory Visit (INDEPENDENT_AMBULATORY_CARE_PROVIDER_SITE_OTHER)
Admission: RE | Admit: 2017-03-22 | Discharge: 2017-03-22 | Disposition: A | Discharge status: Home / Self Care | Payer: Medicare Other | Source: Ambulatory Visit | Attending: Adult Health | Admitting: Adult Health

## 2017-03-22 VITALS — BP 118/70 | HR 89 | Ht 59.0 in | Wt 151.8 lb

## 2017-03-22 DIAGNOSIS — J181 Lobar pneumonia, unspecified organism: Secondary | ICD-10-CM | POA: Diagnosis not present

## 2017-03-22 DIAGNOSIS — G4733 Obstructive sleep apnea (adult) (pediatric): Secondary | ICD-10-CM

## 2017-03-22 DIAGNOSIS — I5022 Chronic systolic (congestive) heart failure: Secondary | ICD-10-CM | POA: Diagnosis not present

## 2017-03-22 DIAGNOSIS — R062 Wheezing: Secondary | ICD-10-CM | POA: Diagnosis not present

## 2017-03-22 DIAGNOSIS — J189 Pneumonia, unspecified organism: Secondary | ICD-10-CM | POA: Diagnosis not present

## 2017-03-22 NOTE — Assessment & Plan Note (Signed)
Recent respiratory distress during CHF admission suspect is from volume overload  May have some chronic asthma from previous PFT noted.  Will repeat PFT , decide if maintence inhalers are indicated.   Plan  PFT on return ov.

## 2017-03-22 NOTE — Assessment & Plan Note (Signed)
Recent flare now improved  Cont on current regimen  follow up with cards

## 2017-03-22 NOTE — Patient Instructions (Addendum)
Set up for sleep study .  Continue on current regimen .  Follow up with in 6-8 weeks with Elsworth Soho PFT and As needed   Please contact office for sooner follow up if symptoms do not improve or worsen or seek emergency care

## 2017-03-22 NOTE — Assessment & Plan Note (Signed)
Previous Mild OSA  Will check sleep study to see if persistent OSA as she has CHF .   Plan  Set up split night sleep study .

## 2017-03-22 NOTE — Progress Notes (Signed)
@Patient  ID: Morgan Clay, female    DOB: 18-Feb-1946, 71 y.o.   MRN: [id number]  Chief Complaint  Patient presents with  . Follow-up    OSA     Referring provider: [name], DO  HPI: 71 yo female never smoker , with CHF and Mild OSA , -CPAP intolerant seen for pulmonary consult during hospitalization 02/2017 for CHF exacerbation .   TEST [facility]  PFT 1/7/4:  FVC 1.29 L (62%) FEV1 0.85 L (52%) FEV1/FVC 0.66 FEF 25-75 0.30 L (18%) POLYSOMNOGRAM 6/3/9:  Epworth sleepiness score 9. Height 59 inches, weight 160 pounds, & BMI 32.3. Mild obstructive sleep apnea with AHI 11 MN/hour & RDI 19 events/hour. Lowest saturation 81%. TTE 02/24/17:  LV normal in size with moderate concentric hypertrophy. EF 45-50% without regional wall motion abnormality. Insufficient to assess diastolic function but Doppler parameters consistent with high ventricular filling pressure. LA severely dilated & RA normal in size. RV normal in size and function with pacer wire noted within right ventricle. Pulmonary artery systolic pressure 47 mmHg. Moderate aortic regurgitation without stenosis. Aortic root normal in size. Mild mitral regurgitation without stenosis. No pulmonic stenosis or regurgitation. Severe tricuspid regurgitation. No pericardial effusion.  03/22/2017 Follow up ; ?Asthma /OSA  Pt returns for follow up from recent hospitalization . She was admitted last month for CHF exacerbation . She difficulty breathing with cough and wheezing during admission and was seen by Pulmonary . She was treated with aggressive diuresis which improved her breathing and interstitial edema on chest xray . She is a never smoker but says she had childhood asthma. Some second hand smoke exposure. Previous PFT in 2004 showed moderate obstruction but has never been told she had adult asthma or COPD and not on any inhalers.  She is feeling much better since discharge. Breathing is back to baseline . No cough or wheezing .    Previous dx of OSA in past ~2009 but did not like CPAP . We discussed potential complications of OSA . She has underlying CHF.  She denies snoring or daytime sleepiness.     No Known Allergies  Immunization History  Administered Date(s) Administered  . Influenza Whole 04/23/2008, 04/07/2010  . Influenza, High Dose Seasonal PF 02/25/2016  . Influenza,inj,Quad PF,6+ Mos 01/08/2013, 02/24/2014  . Pneumococcal Conjugate-13 08/25/2013  . Pneumococcal Polysaccharide-23 08/26/2014  . Td 05/08/2006, 05/30/2016  . Zoster 01/08/2013    Past Medical History:  Diagnosis Date  . AICD (automatic cardioverter/defibrillator) present   . Arthritis   . Asthma    reports mild asthma since childhood - had COPD on dx list from prior PCP  . Atypical atrial flutter ([place]) 09/28/2015  . Biventricular implantable cardiac defibrillator in situ 12/24/2006   Protecta- [employer]  ICD gen change 2012  . Chronic systolic congestive heart failure ([place])   . DEPRESSION 12/17/2009   Annotation: PHQ-9 score = 14 done on 12/17/2009 Qualifier: Diagnosis of  By: Hassell Done FNP, Tori Milks    . FIBROIDS, UTERUS 03/05/2008   Qualifier: Diagnosis of  By: Isla Pence    . Glaucoma   . Hepatitis C 01/04/2007   Qualifier: Diagnosis of  By: Hassell Done FNP, Tori Milks    . HEPATITIS C - s/p treatment with Harvoni, seeing hepatology, Dawn Drazek  01/04/2007   Qualifier: Diagnosis of  By: Hassell Done FNP, Tori Milks    . Hx of cardiovascular stress test    Lexiscan Myoview (12/15): No ischemia, EF 33%, high risk due to depressed LV function   .  Hx of echocardiogram    Echo (12/15): Severe LVH, EF 25-30%, diffuse HK, restrictive physiology, mild AI, mild MR, moderate LAE, mild RVE, mild RAE, moderate to severe TR, mild increased PASP  . Hyperlipemia 12/06/2012  . HYPERTENSION, BENIGN 04/24/2007   Qualifier: Diagnosis of  By: Hassell Done FNP, Tori Milks    . Hypothyroidism   . LBBB (left bundle branch block)   . Nonischemic cardiomyopathy ([place])     Followed by [facility]  . OBESITY 05/27/2009   Qualifier: Diagnosis of  By: Hassell Done FNP, Tori Milks    . OBSTRUCTIVE SLEEP APNEA 11/14/2007   npsg 2009:  Mild osa with AHI 11/hr. Started cpap 2009, quit using due to lack of response Home sleep testing 09/2010 with AHI only 7/hr and weight loss advised by pulmonology.  . OSTEOPENIA 09/30/2008   Qualifier: Diagnosis of  By: Hassell Done FNP, Tori Milks    . Personal history of other infectious and parasitic disease    Hepatitis B    Tobacco History: Social History   Tobacco Use  Smoking Status Never Smoker  Smokeless Tobacco Never Used   Counseling given: Not Answered   Outpatient Encounter Medications as of 03/22/2017  Medication Sig  . albuterol (PROVENTIL HFA;VENTOLIN HFA) 108 (90 BASE) MCG/ACT inhaler Inhale 2 puffs into the lungs every 6 (six) hours as needed for wheezing or shortness of breath.  [name] apixaban (ELIQUIS) 5 MG TABS tablet Take 1 tablet (5 mg total) by mouth 2 (two) times daily.  [name] BIDIL 20-37.5 MG tablet TAKE ONE TABLET BY MOUTH TWICE DAILY  . carvedilol (COREG) 25 MG tablet Take 1 tablet (25 mg total) by mouth 2 (two) times daily.  [name] ENTRESTO 97-103 MG TAKE ONE TABLET BY MOUTH TWICE DAILY **TITRATED  DOSE  UP**  . fluticasone (FLONASE) 50 MCG/ACT nasal spray Place 2 sprays into both nostrils daily.  . furosemide (LASIX) 40 MG tablet Take 1.5 tablets (60 mg total) by mouth daily.  [name] levothyroxine (SYNTHROID, LEVOTHROID) 137 MCG tablet TAKE 1 TABLET BY MOUTH ONCE DAILY BEFORE BREAKFAST  . nitroGLYCERIN (NITROSTAT) 0.4 MG SL tablet Place 0.4 mg under the tongue every 5 (five) minutes as needed for chest pain.  [name] sertraline (ZOLOFT) 100 MG tablet Take 1 tablet (100 mg total) by mouth daily.  [name] spironolactone (ALDACTONE) 25 MG tablet Take 1 tablet (25 mg total) daily by mouth.  . traMADol (ULTRAM) 50 MG tablet 1/2 to 1 tablet as needed for severe pain not more then twice daily. (Patient taking differently: Take 25-50 mg by mouth  every 12 (twelve) hours as needed for moderate pain. )  . traZODone (DESYREL) 50 MG tablet Take 1 tablet (50 mg total) by mouth at bedtime.   No facility-administered encounter medications on file as of 03/22/2017.      Review of Systems  Constitutional:   No  weight loss, night sweats,  Fevers, chills,  +fatigue, or  lassitude.  HEENT:   No headaches,  Difficulty swallowing,  Tooth/dental problems, or  Sore throat,                No sneezing, itching, ear ache, nasal congestion, post nasal drip,   CV:  No chest pain,  Orthopnea, PND, swelling in lower extremities, anasarca, dizziness, palpitations, syncope.   GI  No heartburn, indigestion, abdominal pain, nausea, vomiting, diarrhea, change in bowel habits, loss of appetite, bloody stools.   Resp:   No chest wall deformity  Skin: no rash or lesions.  GU: no dysuria, change in  color of urine, no urgency or frequency.  No flank pain, no hematuria   MS:  No joint pain or swelling.  No decreased range of motion.  No back pain.    Physical Exam  BP 118/70 (BP Location: Left Arm, Cuff Size: Normal)   Pulse 89   Ht 4\' 11"  (1.499 m)   Wt 151 lb 12.8 oz (68.9 kg)   SpO2 100%   BMI 30.66 kg/m   GEN: A/Ox3; pleasant , NAD    HEENT:  [place]/AT,  EACs-clear, TMs-wnl, NOSE-clear, THROAT-clear, no lesions, no postnasal drip or exudate noted.   NECK:  Supple w/ fair ROM; no JVD; normal carotid impulses w/o bruits; no thyromegaly or nodules palpated; no lymphadenopathy.    RESP  Clear  P & A; w/o, wheezes/ rales/ or rhonchi. no accessory muscle use, no dullness to percussion  CARD:  RRR, no m/r/g, no peripheral edema, pulses intact, no cyanosis or clubbing.  GI:   Soft & nt; nml bowel sounds; no organomegaly or masses detected.   Musco: Warm bil, no deformities or joint swelling noted.   Neuro: alert, no focal deficits noted.    Skin: Warm, no lesions or rashes    Lab Results:  CBC  Imaging: Dg Chest 2 View  Result Date:  03/22/2017 CLINICAL DATA:  Pneumonia. EXAM: CHEST  2 VIEW COMPARISON:  Radiograph of February 28, 2017. FINDINGS: Stable cardiomediastinal silhouette. Atherosclerosis of thoracic aorta is noted. Left-sided pacemaker is unchanged in position. No pneumothorax or pleural effusion is noted. Both lungs are clear. The visualized skeletal structures are unremarkable. IMPRESSION: No active cardiopulmonary disease.  Aortic atherosclerosis. Electronically Signed   By: Marijo Conception, M.D.   On: 03/22/2017 14:53   Dg Chest 2 View  Result Date: 02/22/2017 CLINICAL DATA:  Fluid overload. EXAM: CHEST  2 VIEW COMPARISON:  Chest x-ray dated Oct 01, 2015. FINDINGS: Stable cardiomegaly. Unchanged left chest wall AICD. Lower lobe predominant interstitial markings. No focal consolidation, pleural effusion, or pneumothorax. No acute osseous abnormality. IMPRESSION: Cardiomegaly and mild pulmonary vascular congestion, unchanged. Electronically Signed   By: Titus Dubin M.D.   On: 02/22/2017 18:55   Dg Chest Port 1 View  Result Date: 02/28/2017 CLINICAL DATA:  Shortness of breath EXAM: PORTABLE CHEST 1 VIEW COMPARISON:  02/26/2017 FINDINGS: Pacemaker/ AICD has a good appearance. Chronic cardiomegaly and aortic atherosclerosis. Abnormal patchy density in both lower lobes worrisome for pneumonia, or possibly residual edema. No dense consolidation or lobar collapse. No effusions. Bony structures are unremarkable. IMPRESSION: Cardiomegaly. Patchy density in both lower lungs that could represent mild pneumonia or residual edema. Electronically Signed   By: Nelson Chimes M.D.   On: 02/28/2017 11:16   Dg Chest Port 1 View  Result Date: 02/26/2017 CLINICAL DATA:  Cough and worsening shortness of breath. EXAM: PORTABLE CHEST 1 VIEW COMPARISON:  Chest x-ray dated March 04, 2017. FINDINGS: Left chest wall AICD in unchanged position. Mild cardiomegaly, unchanged. Persistent mild pulmonary vascular congestion with slightly improved  interstitial opacities. No focal consolidation, pleural effusion, or pneumothorax. No acute osseous abnormality. IMPRESSION: Stable cardiomegaly and pulmonary vascular congestion with slightly improved interstitial edema. Electronically Signed   By: Titus Dubin M.D.   On: 02/26/2017 09:42   Dg Chest Port 1 View  Result Date: 02/24/2017 CLINICAL DATA:  Productive cough for 1 week. Acute shortness of breath today. EXAM: PORTABLE CHEST 1 VIEW COMPARISON:  02/22/2017 and prior radiograph FINDINGS: Cardiomegaly and left pacemaker/ ICD again noted. Pulmonary vascular congestion  and mild interstitial opacities/edema noted. No pleural effusion or pneumothorax. No airspace disease or acute bony abnormality is identified. IMPRESSION: Cardiomegaly with mild pulmonary vascular congestion and mild interstitial opacities which may represent mild interstitial edema. Electronically Signed   By: Margarette Canada M.D.   On: 02/24/2017 11:54     Assessment & Plan:   OBSTRUCTIVE SLEEP APNEA Previous Mild OSA  Will check sleep study to see if persistent OSA as she has CHF .   Plan  Set up split night sleep study .   CHF (congestive heart failure) (HCC) Recent flare now improved  Cont on current regimen  follow up with cards   Wheezes Recent respiratory distress during CHF admission suspect is from volume overload  May have some chronic asthma from previous PFT noted.  Will repeat PFT , decide if maintence inhalers are indicated.   Plan  PFT on return ov.       Rexene Edison, NP 03/22/2017

## 2017-03-23 NOTE — Progress Notes (Signed)
Reviewed & agree with plan  

## 2017-03-26 ENCOUNTER — Ambulatory Visit (INDEPENDENT_AMBULATORY_CARE_PROVIDER_SITE_OTHER): Payer: Medicare Other

## 2017-03-26 DIAGNOSIS — I5022 Chronic systolic (congestive) heart failure: Secondary | ICD-10-CM | POA: Diagnosis not present

## 2017-03-26 DIAGNOSIS — Z95810 Presence of automatic (implantable) cardiac defibrillator: Secondary | ICD-10-CM | POA: Diagnosis not present

## 2017-03-26 NOTE — Progress Notes (Signed)
EPIC Encounter for ICM Monitoring  Patient Name: Morgan Clay is a 71 y.o. female Date: 03/26/2017 Primary Care Physican: [name], DO Primary [place] Electrophysiologist: Caryl Comes Dry Weight: 145 lbs (Baseline 140 - 145 lbs) Bi-V Pacing: 99.6%   Clinical Status (26-Feb-2017 to 26-Mar-2017) AT/AF 3 episodes Time in AT/AF 6.6 hr/day (27.3%)  Longest AT/AF 5 days  Observations (2) (26-Feb-2017 to 26-Mar-2017)  AT/AF >= 6 hr for 9 days.  Patient Activity less than 1 hr/day for 1 weeks.     Heart Failure questions reviewed, pt asymptomatic.  Hospitalized 10/18 to 10/25 due to chest pain and CHF exacerbation.    Thoracic impedance normal for last 2 days but was abnormal suggesting fluid accumulation from 01/24/2017 until 03/24/2017.  Prescribed dosage: Furosemide 40 mg 1.5 tablets (60 mg total) daily. May take additional 40 mg tablet as needed for fluid symptoms.  Labs: 03/06/2017 Creatinine 1.53, BUN 28, Potassium 4.2, Sodium 140, EGFR 34-40 03/01/2017 Creatinine 1.64, BUN 39, Potassium 3.7, Sodium 137, EGFR 31-36  02/28/2017 Creatinine 1.82, BUN 40, Potassium 4.2, Sodium 134, EGFR 27-32  02/27/2017 Creatinine 1.69, BUN 33, Potassium 3.6, Sodium 135, EGFR 30-35  02/26/2017 Creatinine 1.85, BUN 31, Potassium 3.7, Sodium 130, EGFR 27-31  02/25/2017 Creatinine 1.85, BUN 28, Potassium 3.8, Sodium 132, EGFR 27-31  02/24/2017 Creatinine 1.68, BUN 23, Potassium 3.9, Sodium 135, EGFR 30-35  02/23/2017 Creatinine 1.45, BUN 17, Potassium 3.2, Sodium 139, EGFR 36-42  02/22/2017 Creatinine 1.37, BUN 14, Potassium 3.6, Sodium 136, EGFR 38-44  12/20/2016 Creatinine 1.2,   BUN 21, Potassium 3.6, Sodium 141 09/26/2016 Creatinine 1.29, BUN 27, Potassium 3.7, Sodium 139, EGFR 52.75  05/30/2016 Creatinine 1.24, BUN 19, Potassium 3.8, Sodium 139, EGFR 55.26  Recommendations:  Advised to call if she feels like she is having fluid symptoms and will check a remote  transmission.   Encouraged to call for fluid symptoms.  Follow-up plan: ICM clinic phone appointment on 05/02/2017.   Copy of ICM check sent to Dr. Caryl Comes.   3 month ICM trend: 03/26/2017    AT/AF    1 Year ICM trend:       [name], RN 03/26/2017 9:59 AM

## 2017-04-18 ENCOUNTER — Encounter (HOSPITAL_BASED_OUTPATIENT_CLINIC_OR_DEPARTMENT_OTHER): Payer: Medicare Other

## 2017-04-26 ENCOUNTER — Other Ambulatory Visit: Payer: Self-pay | Admitting: Internal Medicine

## 2017-04-26 DIAGNOSIS — I10 Essential (primary) hypertension: Secondary | ICD-10-CM

## 2017-04-26 DIAGNOSIS — I5023 Acute on chronic systolic (congestive) heart failure: Secondary | ICD-10-CM

## 2017-04-27 ENCOUNTER — Other Ambulatory Visit: Payer: Self-pay | Admitting: Internal Medicine

## 2017-04-27 ENCOUNTER — Telehealth: Payer: Self-pay | Admitting: Internal Medicine

## 2017-04-27 NOTE — Telephone Encounter (Signed)
This has already been sent in today via escribe request from the pharmacy.    Morgan Clay  04/27/2017  Refill  MRN:  [id number]  Description: 71 year old female Provider: [name], MD Department: [facility] Office  Call Documentation   No notes of this type exist for this encounter.  Encounter MyChart Messages   No messages in this encounter  Approved    Disp Refills Start End  ENTRESTO 97-103 MG 60 tablet 10 04/27/2017   Sig:  TAKE 1 TABLET BY MOUTH TWICE DAILY **TITRATED DOSE UP**  Class:  Normal  DAW:  No  Comment:  Please consider 90 day supplies to promote better adherence  Authorizing Provider:  [name], MD  Ordering User:  Joaquim Lai, [place], [place] [address]  Contacts    Type Contact Phone  04/27/2017 01:09 PM Interface (Incoming) Belva Agee [phone]  Routing History   Priority Sent On From To Message Type   04/27/2017 1:09 PM Interface, Surescripts Out P Cv Div Ch St Refill Rx Request  Created by   Interface, Surescripts Out on 04/27/2017 01:09 PM

## 2017-04-27 NOTE — Telephone Encounter (Signed)
Pt called to say she still has not gotten her medication called in.  Med    ENESTO

## 2017-05-02 ENCOUNTER — Ambulatory Visit (INDEPENDENT_AMBULATORY_CARE_PROVIDER_SITE_OTHER): Payer: Medicare Other | Admitting: *Deleted

## 2017-05-02 DIAGNOSIS — I428 Other cardiomyopathies: Secondary | ICD-10-CM | POA: Diagnosis not present

## 2017-05-02 DIAGNOSIS — Z95810 Presence of automatic (implantable) cardiac defibrillator: Secondary | ICD-10-CM

## 2017-05-02 DIAGNOSIS — I5022 Chronic systolic (congestive) heart failure: Secondary | ICD-10-CM | POA: Diagnosis not present

## 2017-05-03 ENCOUNTER — Encounter: Payer: Self-pay | Admitting: Cardiology

## 2017-05-03 LAB — CUP PACEART REMOTE DEVICE CHECK
Brady Statistic AP VS Percent: 0 %
Brady Statistic AS VP Percent: 98.62 %
Date Time Interrogation Session: 20181226072704
HIGH POWER IMPEDANCE MEASURED VALUE: 72 Ohm
HighPow Impedance: 45 Ohm
Implantable Lead Implant Date: 20080818
Implantable Lead Implant Date: 20080818
Implantable Lead Location: 753859
Implantable Lead Model: 6947
Implantable Pulse Generator Implant Date: 20170531
Lead Channel Impedance Value: 4047 Ohm
Lead Channel Impedance Value: 437 Ohm
Lead Channel Impedance Value: 437 Ohm
Lead Channel Pacing Threshold Pulse Width: 0.4 ms
Lead Channel Pacing Threshold Pulse Width: 0.4 ms
Lead Channel Pacing Threshold Pulse Width: 0.4 ms
Lead Channel Sensing Intrinsic Amplitude: 14.25 mV
Lead Channel Sensing Intrinsic Amplitude: 14.25 mV
Lead Channel Sensing Intrinsic Amplitude: 3.375 mV
Lead Channel Setting Pacing Amplitude: 1.5 V
Lead Channel Setting Pacing Amplitude: 2.25 V
Lead Channel Setting Pacing Pulse Width: 0.4 ms
MDC IDC LEAD IMPLANT DT: 20080818
MDC IDC LEAD LOCATION: 753858
MDC IDC LEAD LOCATION: 753860
MDC IDC MSMT BATTERY REMAINING LONGEVITY: 77 mo
MDC IDC MSMT BATTERY VOLTAGE: 2.98 V
MDC IDC MSMT LEADCHNL LV IMPEDANCE VALUE: 4047 Ohm
MDC IDC MSMT LEADCHNL LV PACING THRESHOLD AMPLITUDE: 1.25 V
MDC IDC MSMT LEADCHNL RA PACING THRESHOLD AMPLITUDE: 0.75 V
MDC IDC MSMT LEADCHNL RA SENSING INTR AMPL: 3.375 mV
MDC IDC MSMT LEADCHNL RV IMPEDANCE VALUE: 304 Ohm
MDC IDC MSMT LEADCHNL RV IMPEDANCE VALUE: 380 Ohm
MDC IDC MSMT LEADCHNL RV PACING THRESHOLD AMPLITUDE: 0.875 V
MDC IDC SET LEADCHNL RV PACING AMPLITUDE: 2.5 V
MDC IDC SET LEADCHNL RV PACING PULSEWIDTH: 0.4 ms
MDC IDC SET LEADCHNL RV SENSING SENSITIVITY: 0.3 mV
MDC IDC STAT BRADY AP VP PERCENT: 0.05 %
MDC IDC STAT BRADY AS VS PERCENT: 1.34 %
MDC IDC STAT BRADY RA PERCENT PACED: 0.05 %
MDC IDC STAT BRADY RV PERCENT PACED: 98.45 %

## 2017-05-03 NOTE — Progress Notes (Signed)
Remote ICD transmission.   

## 2017-05-03 NOTE — Progress Notes (Signed)
EPIC Encounter for ICM Monitoring  Patient Name: Morgan Clay is a 71 y.o. female Date: 05/03/2017 Primary Care Physican: [name], DO Primary [place] Electrophysiologist: Caryl Comes Dry Weight: 146 lbs (Baseline 140 - 145 lbs) Bi-V Pacing: 98.5%      Clinical Status (26-Mar-2017 to 02-May-2017) Treated VT/VF 0 episodes  AT/AF 21 episodes  Time in AT/AF 24.0 hr/day (100.0%)   Observations (1) (26-Mar-2017 to 02-May-2017)  AT/AF >= 6 hr for 37 days.      Heart Failure questions reviewed, pt asymptomatic.  Patient denied any changes and says she is feeling fine.  Does not feel like her heart is out of rhythm.     Thoracic impedance normal.  Per report % of AT/AF has risen from 27.3% on 03/26/17 to 100% on 05/02/17.   Prescribed dosage: Furosemide 40 mg 1 tablet (40 mg total) twice daily.   Labs: 03/06/2017 Creatinine 1.53, BUN 28, Potassium 4.2, Sodium 140, EGFR 34-40 03/01/2017 Creatinine 1.64, BUN 39, Potassium 3.7, Sodium 137, EGFR 31-36  02/28/2017 Creatinine 1.82, BUN 40, Potassium 4.2, Sodium 134, EGFR 27-32  02/27/2017 Creatinine 1.69, BUN 33, Potassium 3.6, Sodium 135, EGFR 30-35  02/26/2017 Creatinine 1.85, BUN 31, Potassium 3.7, Sodium 130, EGFR 27-31  02/25/2017 Creatinine 1.85, BUN 28, Potassium 3.8, Sodium 132, EGFR 27-31  02/24/2017 Creatinine 1.68, BUN 23, Potassium 3.9, Sodium 135, EGFR 30-35  02/23/2017 Creatinine 1.45, BUN 17, Potassium 3.2, Sodium 139, EGFR 36-42  02/22/2017 Creatinine 1.37, BUN 14, Potassium 3.6, Sodium 136, EGFR 38-44  12/20/2016 Creatinine 1.2,   BUN 21, Potassium 3.6, Sodium 141 09/26/2016 Creatinine 1.29, BUN 27, Potassium 3.7, Sodium 139, EGFR 52.75  05/30/2016 Creatinine 1.24, BUN 19, Potassium 3.8, Sodium 139, EGFR 55.26  Recommendations: No changes.  Encouraged to call for fluid symptoms.  Follow-up plan: ICM clinic phone appointment on 06/04/2017.    Copy of ICM check sent to Dr. Caryl Comes.   3 month ICM  trend: 05/02/2017     AT/AF   1 Year ICM trend:       [name], RN 05/03/2017 9:36 AM

## 2017-05-11 ENCOUNTER — Ambulatory Visit: Payer: Medicare Other | Admitting: Adult Health

## 2017-05-20 ENCOUNTER — Encounter (HOSPITAL_BASED_OUTPATIENT_CLINIC_OR_DEPARTMENT_OTHER): Payer: Medicare Other

## 2017-05-22 ENCOUNTER — Ambulatory Visit (INDEPENDENT_AMBULATORY_CARE_PROVIDER_SITE_OTHER): Payer: Medicare Other | Admitting: Adult Health

## 2017-05-22 ENCOUNTER — Encounter: Payer: Self-pay | Admitting: Adult Health

## 2017-05-22 ENCOUNTER — Ambulatory Visit (INDEPENDENT_AMBULATORY_CARE_PROVIDER_SITE_OTHER): Payer: Medicare Other | Admitting: Pulmonary Disease

## 2017-05-22 DIAGNOSIS — G4733 Obstructive sleep apnea (adult) (pediatric): Secondary | ICD-10-CM

## 2017-05-22 DIAGNOSIS — J181 Lobar pneumonia, unspecified organism: Secondary | ICD-10-CM | POA: Diagnosis not present

## 2017-05-22 DIAGNOSIS — I5022 Chronic systolic (congestive) heart failure: Secondary | ICD-10-CM | POA: Diagnosis not present

## 2017-05-22 DIAGNOSIS — J4520 Mild intermittent asthma, uncomplicated: Secondary | ICD-10-CM

## 2017-05-22 DIAGNOSIS — J45909 Unspecified asthma, uncomplicated: Secondary | ICD-10-CM | POA: Insufficient documentation

## 2017-05-22 LAB — PULMONARY FUNCTION TEST
DL/VA % pred: 89 %
DL/VA: 3.64 ml/min/mmHg/L
DLCO cor % pred: 65 %
DLCO cor: 11.52 ml/min/mmHg
DLCO unc % pred: 67 %
DLCO unc: 11.82 ml/min/mmHg
FEF 25-75 Post: 1.06 L/s
FEF 25-75 Pre: 2.49 L/s
FEF2575-%Change-Post: -57 %
FEF2575-%Pred-Post: 76 %
FEF2575-%Pred-Pre: 180 %
FEV1-%Change-Post: -13 %
FEV1-%Pred-Post: 96 %
FEV1-%Pred-Pre: 111 %
FEV1-Post: 1.37 L
FEV1-Pre: 1.59 L
FEV1FVC-%Change-Post: 0 %
FEV1FVC-%Pred-Pre: 113 %
FEV6-%Change-Post: -14 %
FEV6-%Pred-Post: 87 %
FEV6-%Pred-Pre: 102 %
FEV6-Post: 1.54 L
FEV6-Pre: 1.8 L
FEV6FVC-%Change-Post: 0 %
FEV6FVC-%Pred-Post: 105 %
FEV6FVC-%Pred-Pre: 105 %
FVC-%Change-Post: -14 %
FVC-%Pred-Post: 83 %
FVC-%Pred-Pre: 97 %
FVC-Post: 1.54 L
FVC-Pre: 1.8 L
Post FEV1/FVC ratio: 89 %
Post FEV6/FVC ratio: 100 %
Pre FEV1/FVC ratio: 88 %
Pre FEV6/FVC Ratio: 100 %
RV % pred: 85 %
RV: 1.63 L
TLC % pred: 82 %
TLC: 3.55 L

## 2017-05-22 NOTE — Progress Notes (Signed)
@Patient  ID: Morgan Clay, female    DOB: 12-04-1945, 72 y.o.   MRN: [id number]  Chief Complaint  Patient presents with  . Follow-up    Asthma     Referring provider: [name], DO  HPI: 72 yo female never smoker , with CHF and Mild OSA , -CPAP intolerant seen for pulmonary consult during hospitalization 02/2017 for CHF exacerbation .  Has CM CHF with severe TV regurg , PAP 76mmHg. And ICD   TEST /Events  PFT 1/7/4: FVC 1.29 L (62%) FEV1 0.85 L (52%) FEV1/FVC 0.66 FEF 25-75 0.30 L (18%) POLYSOMNOGRAM 6/3/9:Epworth sleepiness score 9. Height 59 inches, weight 160 pounds, &BMI 32.3. Mild obstructive sleep apnea with AHI 11 MN/hour &RDI 19 events/hour. Lowest saturation 81%. TTE 02/24/17: LV normal in size with moderate concentric hypertrophy. EF 45-50% without regional wall motion abnormality. Insufficient to assess diastolic function but Doppler parameters consistent with high ventricular filling pressure. LA severely dilated &RA normal in size. RV normal in size and function with pacer wire noted within right ventricle. Pulmonary artery systolic pressure 47 mmHg. Moderate aortic regurgitation without stenosis. Aortic root normal in size. Mild mitral regurgitation without stenosis. No pulmonic stenosis or regurgitation. Severe tricuspid regurgitation. No pericardial effusion.   05/22/2017 Follow up : Asthma/OSA  Patient returns for a 55-month follow-up.  She was admitted last October with a CHF exacerbation.  She had ongoing cough and wheezing.  Patient was set up for a PFT that was done today that shows normal lung function with FEV1 111%, ratio 88, FVC 97%. DLCO 67% .  This is much improved from 2004 PFT that showed moderate airflow obstruction.  She says she is feeling good with no wheezing , cough or increased dyspnea. Feels breathing is back to baseline. No increased leg swelling .   She has known sleep apnea but has been intolerant of CPAP in the past.  She has been  set up for a split-night sleep study but canceled. We discussed potential complication of untreated OSA . She declines to proceed with sleep study .     No Known Allergies  Immunization History  Administered Date(s) Administered  . Influenza Whole 04/23/2008, 04/07/2010  . Influenza, High Dose Seasonal PF 02/25/2016  . Influenza,inj,Quad PF,6+ Mos 01/08/2013, 02/24/2014  . Pneumococcal Conjugate-13 08/25/2013  . Pneumococcal Polysaccharide-23 08/26/2014  . Td 05/08/2006, 05/30/2016  . Zoster 01/08/2013    Past Medical History:  Diagnosis Date  . AICD (automatic cardioverter/defibrillator) present   . Arthritis   . Asthma    reports mild asthma since childhood - had COPD on dx list from prior PCP  . Atypical atrial flutter ([place]) 09/28/2015  . Biventricular implantable cardiac defibrillator in situ 12/24/2006   Protecta- Medtronic  ICD gen change 2012  . Chronic systolic congestive heart failure ([place])   . DEPRESSION 12/17/2009   Annotation: PHQ-9 score = 14 done on 12/17/2009 Qualifier: Diagnosis of  By: Hassell Done FNP, Tori Milks    . FIBROIDS, UTERUS 03/05/2008   Qualifier: Diagnosis of  By: Isla Pence    . Glaucoma   . Hepatitis C 01/04/2007   Qualifier: Diagnosis of  By: Hassell Done FNP, Tori Milks    . HEPATITIS C - s/p treatment with Harvoni, seeing hepatology, Dawn Drazek  01/04/2007   Qualifier: Diagnosis of  By: Hassell Done FNP, Tori Milks    . Hx of cardiovascular stress test    Lexiscan Myoview (12/15): No ischemia, EF 33%, high risk due to depressed LV function   . Hx  of echocardiogram    Echo (12/15): Severe LVH, EF 25-30%, diffuse HK, restrictive physiology, mild AI, mild MR, moderate LAE, mild RVE, mild RAE, moderate to severe TR, mild increased PASP  . Hyperlipemia 12/06/2012  . HYPERTENSION, BENIGN 04/24/2007   Qualifier: Diagnosis of  By: Hassell Done FNP, Tori Milks    . Hypothyroidism   . LBBB (left bundle branch block)   . Nonischemic cardiomyopathy ([place])    [facility]  . OBESITY 05/27/2009   Qualifier: Diagnosis of  By: Hassell Done FNP, Tori Milks    . OBSTRUCTIVE SLEEP APNEA 11/14/2007   npsg 2009:  Mild osa with AHI 11/hr. Started cpap 2009, quit using due to lack of response Home sleep testing 09/2010 with AHI only 7/hr and weight loss advised by pulmonology.  . OSTEOPENIA 09/30/2008   Qualifier: Diagnosis of  By: Hassell Done FNP, Tori Milks    . Personal history of other infectious and parasitic disease    Hepatitis B    Tobacco History: Social History   Tobacco Use  Smoking Status Never Smoker  Smokeless Tobacco Never Used   Counseling given: Not Answered   Outpatient Encounter Medications as of 05/22/2017  Medication Sig  . albuterol (PROVENTIL HFA;VENTOLIN HFA) 108 (90 BASE) MCG/ACT inhaler Inhale 2 puffs into the lungs every 6 (six) hours as needed for wheezing or shortness of breath.  [name] apixaban (ELIQUIS) 5 MG TABS tablet Take 1 tablet (5 mg total) by mouth 2 (two) times daily.  [name] BIDIL 20-37.5 MG tablet TAKE 1 TABLET BY MOUTH TWICE DAILY  . carvedilol (COREG) 25 MG tablet Take 1 tablet (25 mg total) by mouth 2 (two) times daily.  [name] ENTRESTO 97-103 MG TAKE 1 TABLET BY MOUTH TWICE DAILY **TITRATED  DOSE  UP**  . fluticasone (FLONASE) 50 MCG/ACT nasal spray Place 2 sprays into both nostrils daily.  . furosemide (LASIX) 40 MG tablet Take 60 mg by mouth daily.  [name] levothyroxine (SYNTHROID, LEVOTHROID) 137 MCG tablet TAKE 1 TABLET BY MOUTH ONCE DAILY BEFORE BREAKFAST  . nitroGLYCERIN (NITROSTAT) 0.4 MG SL tablet Place 0.4 mg under the tongue every 5 (five) minutes as needed for chest pain.  [name] sertraline (ZOLOFT) 100 MG tablet Take 1 tablet (100 mg total) by mouth daily.  [name] spironolactone (ALDACTONE) 25 MG tablet Take 1 tablet (25 mg total) daily by mouth.  . traMADol (ULTRAM) 50 MG tablet 1/2 to 1 tablet as needed for severe pain not more then twice daily. (Patient taking differently: Take 25-50 mg by mouth every 12 (twelve) hours as needed for moderate  pain. )  . traZODone (DESYREL) 50 MG tablet Take 1 tablet (50 mg total) by mouth at bedtime.  . [DISCONTINUED] furosemide (LASIX) 40 MG tablet TAKE ONE TABLET BY MOUTH TWICE DAILY   No facility-administered encounter medications on file as of 05/22/2017.      Review of Systems  Constitutional:   No  weight loss, night sweats,  Fevers, chills, fatigue, or  lassitude.  HEENT:   No headaches,  Difficulty swallowing,  Tooth/dental problems, or  Sore throat,                No sneezing, itching, ear ache, nasal congestion, post nasal drip,   CV:  No chest pain,  Orthopnea, PND,  , anasarca, dizziness, palpitations, syncope.   GI  No heartburn, indigestion, abdominal pain, nausea, vomiting, diarrhea, change in bowel habits, loss of appetite, bloody stools.   Resp: No shortness of breath with exertion or at rest.  No excess mucus, no productive cough,  No non-productive cough,  No coughing up of blood.  No change in color of mucus.  No wheezing.  No chest wall deformity  Skin: no rash or lesions.  GU: no dysuria, change in color of urine, no urgency or frequency.  No flank pain, no hematuria   MS:  No joint pain or swelling.  No decreased range of motion.  No back pain.    Physical Exam  BP 122/80 (BP Location: Left Arm, Cuff Size: Normal)   Pulse 80   Ht 4\' 11"  (1.499 m)   Wt 152 lb (68.9 kg)   SpO2 94%   BMI 30.70 kg/m   GEN: A/Ox3; pleasant , NAD, well nourished    HEENT:  [place]/AT,  EACs-clear, TMs-wnl, NOSE-clear, THROAT-clear, no lesions, no postnasal drip or exudate noted.   NECK:  Supple w/ fair ROM; no JVD; normal carotid impulses w/o bruits; no thyromegaly or nodules palpated; no lymphadenopathy.    RESP  Clear  P & A; w/o, wheezes/ rales/ or rhonchi. no accessory muscle use, no dullness to percussion  CARD:  RRR, no m/r/g, no peripheral edema, pulses intact, no cyanosis or clubbing.  GI:   Soft & nt; nml bowel sounds; no organomegaly or masses detected.   Musco: Warm  bil, no deformities or joint swelling noted.   Neuro: alert, no focal deficits noted.    Skin: Warm, no lesions or rashes    Lab Results:  CBC    No results found.   Assessment & Plan:   Chronic systolic heart failure ([place]) Appears compensated without evidence of fluid overload   Plan Cont follow up with Cardiology .   OBSTRUCTIVE SLEEP APNEA Mild OSA - declines repeat sleep study or CPAP  Despite pt educatioin    Asthma Mild intermittent asthma appears compensated  PFT shows normal lung function  Hold on controller inhaler for now  Use Albuterol inhaler As needed   follow up in 6 months and As needed        Rexene Edison, NP 05/22/2017

## 2017-05-22 NOTE — Progress Notes (Signed)
PFT completed today 05/22/17

## 2017-05-22 NOTE — Assessment & Plan Note (Signed)
Appears compensated without evidence of fluid overload   Plan Cont follow up with Cardiology .

## 2017-05-22 NOTE — Patient Instructions (Addendum)
May Use Ventolin Inhaler As needed  Wheezing .  Call back if you change your mind on sleep study .  Follow up with Dr. Elsworth Soho  In 6 months and As needed

## 2017-05-22 NOTE — Assessment & Plan Note (Signed)
Mild intermittent asthma appears compensated  PFT shows normal lung function  Hold on controller inhaler for now  Use Albuterol inhaler As needed   follow up in 6 months and As needed

## 2017-05-22 NOTE — Assessment & Plan Note (Signed)
Mild OSA - declines repeat sleep study or CPAP  Despite pt educatioin

## 2017-06-04 ENCOUNTER — Ambulatory Visit (INDEPENDENT_AMBULATORY_CARE_PROVIDER_SITE_OTHER): Payer: Medicare Other

## 2017-06-04 DIAGNOSIS — Z95810 Presence of automatic (implantable) cardiac defibrillator: Secondary | ICD-10-CM

## 2017-06-04 DIAGNOSIS — I5022 Chronic systolic (congestive) heart failure: Secondary | ICD-10-CM

## 2017-06-04 NOTE — Progress Notes (Signed)
EPIC Encounter for ICM Monitoring  Patient Name: Morgan Clay is a 72 y.o. female Date: 06/04/2017 Primary Care Physican: [name], DO [facility] [place] Electrophysiologist: Caryl Comes Dry Weight: 146 lbs (Baseline140 - 145 lbs) Bi-V Pacing: 98.5%  Clinical Status (02-May-2017 to 04-Jun-2017) Treated VT/VF 0 episodes AT/AF 13 episodes Time in AT/AF 24.0 hr/day (100.0%) Observations (1) (02-May-2017 to 04-Jun-2017)  AT/AF >= 6 hr for 33 days.       Heart Failure questions reviewed, pt asymptomatic.   Thoracic impedance normal.  Prescribed dosage: Furosemide 40 mg 1tablet(40 mg total) twice daily.   Labs: 03/06/2017 Creatinine1.53, BUN28, Potassium4.2, Sodium140, [id number] 03/01/2017 Creatinine1.64, BUN39, Potassium3.7, [id number], [id number]  02/28/2017 Creatinine1.82, BUN40, Potassium4.2, Sodium134, [id number]  02/27/2017 Creatinine1.69, BUN33, Potassium3.6, Sodium135, EGFR30-35  02/26/2017 Creatinine1.85, BUN31, Potassium3.7, Sodium130, [id number]  02/25/2017 Creatinine1.85, BUN28, Potassium3.8, Sodium132, [id number]  02/24/2017 Creatinine1.68, BUN23, Potassium3.9, Sodium135, OACZ66-06  02/23/2017 Creatinine1.45, BUN17, Potassium3.2, [id number], [id number]  02/22/2017 Creatinine1.37, BUN14, Potassium3.6, [id number], [id number] 12/20/2016 Creatinine 1.2, BUN 21, Potassium 3.6, Sodium 141 09/26/2016 Creatinine 1.29, BUN 27, Potassium 3.7, Sodium 139, EGFR 52.75  05/30/2016 Creatinine 1.24, BUN 19, Potassium 3.8, Sodium 139, EGFR 55.26  Recommendations: No changes.  Encouraged to call for fluid symptoms.  Follow-up plan: ICM clinic phone appointment on 07/05/2017.  Office appointment scheduled 07/19/2017 with Dr. Caryl Comes.  Copy of ICM check sent to Dr. Caryl Comes.   3 month ICM trend: 06/04/2017    AT/AF    1 Year ICM trend:       [name], RN 06/04/2017 2:15 PM

## 2017-06-05 NOTE — Progress Notes (Signed)
Reviewed transmission with Dr Caryl Comes and he recommended patient be seen by either Chanetta Marshall, NP or [name] PA at the next available appointment due to reported shows patient is out of Normal Sinus Rhythm.

## 2017-06-05 NOTE — Progress Notes (Signed)
Call to patient and explained report was reviewed with Dr Caryl Comes and shows she is out of Normal Sinus Rhythm and would like for either PA or NP for EP to been scheduled at next opening.  She said that is fine. She said she feels fine and didn't know she has been out of NSR.  Explained the scheduler will call her about an appointment.

## 2017-06-05 NOTE — Progress Notes (Signed)
HPI:  Morgan Clay is a pleasant 72 y.o. here for follow up. Chronic medical problems summarized below were reviewed for changes and stability and were updated as needed below. These issues and their treatment remain stable for the most part.  She does have a new issue of R lat upper leg pain. Started when getting out of bed about 1 week ago. Only feels pain with certain movement this leg especially grossing her leg. No weakness, numbness, radiation or malaise. Denies CP, SOB, DOE, treatment intolerance or new symptoms. Due for flu vaccine, foot exam, labs  A. Flutter, Hx chronic sCHF, NICM, HTN, HLD: -s/p BV ICD placement -sees cardiologist for management -sees nephrologist for resistant HTN and CKD  DM - diet controlled/CKD: -sees nephrologist -reports eye exam is up-to-date -denied dx of diabetes for some time, finally seems to understand -has been well controlled for several years with diet and exercise  Hypothyroidism: -thyroid level was normal on last check -meds: levothyroxine  MDD: -meds: zoloft, trazadone - needs refill trazadone per her report -reports mood good  Asthma: -sees pulmonologist -uses alb prn  Obesity: -weight last visit 09/2016 was 150 --> 150 -reports exercising and eats ok  Hx Hep C: -followed by hepatologist -s/p treatment  ROS: See pertinent positives and negatives per HPI.  Past Medical History:  Diagnosis Date  . AICD (automatic cardioverter/defibrillator) present   . Arthritis   . Asthma    reports mild asthma since childhood - had COPD on dx list from prior PCP  . Atypical atrial flutter ([place]) 09/28/2015  . Biventricular implantable cardiac defibrillator in situ 12/24/2006   Protecta- Medtronic  ICD gen change 2012  . Chronic systolic congestive heart failure ([place])   . DEPRESSION 12/17/2009   Annotation: PHQ-9 score = 14 done on 12/17/2009 Qualifier: Diagnosis of  By: Hassell Done FNP, Tori Milks    . FIBROIDS, UTERUS 03/05/2008    Qualifier: Diagnosis of  By: Isla Pence    . Glaucoma   . Hepatitis C 01/04/2007   Qualifier: Diagnosis of  By: Hassell Done FNP, Tori Milks    . HEPATITIS C - s/p treatment with Harvoni, seeing hepatology, Dawn Drazek  01/04/2007   Qualifier: Diagnosis of  By: Hassell Done FNP, Tori Milks    . Hx of cardiovascular stress test    Lexiscan Myoview (12/15): No ischemia, EF 33%, high risk due to depressed LV function   . Hx of echocardiogram    Echo (12/15): Severe LVH, EF 25-30%, diffuse HK, restrictive physiology, mild AI, mild MR, moderate LAE, mild RVE, mild RAE, moderate to severe TR, mild increased PASP  . Hyperlipemia 12/06/2012  . HYPERTENSION, BENIGN 04/24/2007   Qualifier: Diagnosis of  By: Hassell Done FNP, Tori Milks    . Hypothyroidism   . LBBB (left bundle branch block)   . Nonischemic cardiomyopathy ([place])    Follow[facility] by [facility]  . OBESITY 05/27/2009   Qualifier: Diagnosis of  By: Hassell Done FNP, Tori Milks    . OBSTRUCTIVE SLEEP APNEA 11/14/2007   npsg 2009:  Mild osa with AHI 11/hr. Started cpap 2009, quit using due to lack of response Home sleep testing 09/2010 with AHI only 7/hr and weight loss advised by pulmonology.  . OSTEOPENIA 09/30/2008   Qualifier: Diagnosis of  By: Hassell Done FNP, Tori Milks    . Personal history of other infectious and parasitic disease    Hepatitis B  . Resistant hypertension 08/30/2015    Past Surgical History:  Procedure Laterality Date  . appendectomy    . CARDIAC CATHETERIZATION    .  CARDIOVERSION N/A 12/14/2014   Procedure: CARDIOVERSION;  Surgeon: Dorothy Spark, MD;  Location[facility] ENDOSCOPY;  Service: Cardiovascular;  Laterality: N/A;  . CARDIOVERSION N/A 02/26/2017   Procedure: CARDIOVERSION;  Surgeon: Evans Lance, MD;  Location: [place] CV LAB;  Service: Cardiovascular;  Laterality: N/A;  . EP IMPLANTABLE DEVICE N/A 10/06/2015   Procedure: BIV ICD Generator Changeout;  Surgeon: [name], MD;  Location: [place] CV LAB;  Service: Cardiovascular;   Laterality: N/A;  . INSERT / REPLACE / REMOVE PACEMAKER     concerto  . L89.21     Concerto [id number]    Family History  Problem Relation Age of Onset  . Asthma Father   . Heart attack Father   . Asthma Sister   . Cancer Sister        lung  . Heart attack Mother   . Stroke Brother   . Diabetes Unknown     Social History   Socioeconomic History  . Marital status: Single    Spouse name: None  . Number of children: None  . Years of education: None  . Highest education level: None  Social Needs  . Financial resource strain: None  . Food insecurity - worry: None  . Food insecurity - inability: None  . Transportation needs - medical: None  . Transportation needs - non-medical: None  Occupational History  . None  Tobacco Use  . Smoking status: Never Smoker  . Smokeless tobacco: Never Used  Substance and Sexual Activity  . Alcohol use: Yes    Comment: 3 glasses of wine 3-4 times per week  . Drug use: No  . Sexual activity: None  Other Topics Concern  . None  Social History Narrative   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      Home Situation: lives alone      Spiritual Beliefs: [place]      Lifestyle: no regular exercise; poor diet              Current Outpatient Medications:  .  albuterol (PROVENTIL HFA;VENTOLIN HFA) 108 (90 BASE) MCG/ACT inhaler, Inhale 2 puffs into the lungs every 6 (six) hours as needed for wheezing or shortness of breath., Disp: 1 Inhaler, Rfl: 3 .  apixaban (ELIQUIS) 5 MG TABS tablet, Take 1 tablet (5 mg total) by mouth 2 (two) times daily., Disp: 180 tablet, Rfl: 3 .  BIDIL 20-37.5 MG tablet, TAKE 1 TABLET BY MOUTH TWICE DAILY, Disp: 180 tablet, Rfl: 3 .  ENTRESTO 97-103 MG, TAKE 1 TABLET BY MOUTH TWICE DAILY **TITRATED  DOSE  UP**, Disp: 60 tablet, Rfl: 10 .  fluticasone (FLONASE) 50 MCG/ACT nasal spray, Place 2 sprays into both nostrils daily., Disp: 16 g, Rfl: 6 .  furosemide (LASIX) 40 MG tablet, Take 60 mg by mouth  daily., Disp: , Rfl:  .  levothyroxine (SYNTHROID, LEVOTHROID) 137 MCG tablet, TAKE 1 TABLET BY MOUTH ONCE DAILY BEFORE BREAKFAST, Disp: 90 tablet, Rfl: 1 .  nitroGLYCERIN (NITROSTAT) 0.4 MG SL tablet, Place 0.4 mg under the tongue every 5 (five) minutes as needed for chest pain., Disp: , Rfl:  .  sertraline (ZOLOFT) 100 MG tablet, Take 1 tablet (100 mg total) by mouth daily., Disp: 90 tablet, Rfl: 1 .  spironolactone (ALDACTONE) 25 MG tablet, Take 1 tablet (25 mg total) daily by mouth., Disp: 90 tablet, Rfl: 3 .  traZODone (DESYREL) 50 MG tablet, Take 1 tablet (50 mg total) by mouth at bedtime.,  Disp: 90 tablet, Rfl: 3 .  carvedilol (COREG) 25 MG tablet, Take 1 tablet (25 mg total) by mouth 2 (two) times daily., Disp: 180 tablet, Rfl: 3  EXAM:  Vitals:   06/07/17 1427  BP: (!) 100/50  Pulse: 78  Temp: 98 F (36.7 C)    Body mass index is 30.24 kg/m.  GENERAL: vitals reviewed and listed above, alert, oriented, appears well hydrated and in no acute distress  HEENT: atraumatic, conjunttiva clear, no obvious abnormalities on inspection of external nose and ears  NECK: no obvious masses on inspection  LUNGS: clear to auscultation bilaterally, no wheezes, rales or rhonchi, good air movement  CV: HRRR, no peripheral edema  MS: moves all extremities without noticeable abnormality, TTP R IT band seems more in IT band then in bursa, normal strength throughout and gai PSYCH: pleasant and cooperative, no obvious depression or anxiety  ASSESSMENT AND PLAN:  Discussed the following assessment and plan:  MDD (major depressive disorder), recurrent, in partial remission ([place]) -stable -refills  Hypertension associated with diabetes ([place]) - Plan: Basic metabolic panel, CBC -labs -cont current meds  Controlled type 2 diabetes mellitus with chronic kidney disease, without long-term current use of insulin, unspecified CKD stage ([facility]) - Plan: Hemoglobin A1c, Ambulatory referral to  Podiatry -labs -foot exam done - refer podiatry -lifestyle recs  Stage 3 chronic kidney disease ([place]) -sees nephrologist for management  Chronic systolic congestive heart failure ([place]) Atypical atrial flutter ([facility]) Nonischemic cardiomyopathy ([place]) -sees cardiology for management, cont current tx  Chronic obstructive pulmonary disease, unspecified COPD type ([place]) -sees pulm for management, denies tobacco use today  Other specified hypothyroidism - Plan: TSH  [place] - Plan: Ambulatory referral to Podiatry  -Patient advised to return or notify a doctor immediately if symptoms worsen or persist or new concerns arise.  Patient Instructions  BEFORE YOU LEAVE: -phq9 -flu shot -IT exercises -labs -follow up: AWV with Manuela Schwartz and follow up Dr. [name] for leg pain in 1 month  -We placed a referral for you as discussed. It usually takes about 1-2 weeks to process and schedule this referral. If you have not heard from [name] regarding this appointment in 2 weeks please contact our office.  -We have ordered labs or studies at this visit. It can take up to 1-2 weeks for results and processing. IF results require follow up or explanation, we will call you with instructions. Clinically stable results will be released to your [facility]. If you have not heard from [name] or cannot find your results in [facility] in 2 weeks please contact our office at [phone].  If you are not yet signed up for [facility], please consider signing up.           [name], DO

## 2017-06-07 ENCOUNTER — Encounter: Payer: Self-pay | Admitting: Family Medicine

## 2017-06-07 ENCOUNTER — Ambulatory Visit (INDEPENDENT_AMBULATORY_CARE_PROVIDER_SITE_OTHER): Payer: Medicare Other | Admitting: Family Medicine

## 2017-06-07 VITALS — BP 100/50 | HR 78 | Temp 98.0°F | Ht 59.0 in | Wt 149.7 lb

## 2017-06-07 DIAGNOSIS — E038 Other specified hypothyroidism: Secondary | ICD-10-CM

## 2017-06-07 DIAGNOSIS — I5022 Chronic systolic (congestive) heart failure: Secondary | ICD-10-CM

## 2017-06-07 DIAGNOSIS — F3341 Major depressive disorder, recurrent, in partial remission: Secondary | ICD-10-CM

## 2017-06-07 DIAGNOSIS — J449 Chronic obstructive pulmonary disease, unspecified: Secondary | ICD-10-CM

## 2017-06-07 DIAGNOSIS — E1122 Type 2 diabetes mellitus with diabetic chronic kidney disease: Secondary | ICD-10-CM | POA: Diagnosis not present

## 2017-06-07 DIAGNOSIS — Z23 Encounter for immunization: Secondary | ICD-10-CM | POA: Diagnosis not present

## 2017-06-07 DIAGNOSIS — I484 Atypical atrial flutter: Secondary | ICD-10-CM | POA: Diagnosis not present

## 2017-06-07 DIAGNOSIS — L84 Corns and callosities: Secondary | ICD-10-CM

## 2017-06-07 DIAGNOSIS — E1159 Type 2 diabetes mellitus with other circulatory complications: Secondary | ICD-10-CM | POA: Diagnosis not present

## 2017-06-07 DIAGNOSIS — N183 Chronic kidney disease, stage 3 unspecified: Secondary | ICD-10-CM

## 2017-06-07 DIAGNOSIS — I152 Hypertension secondary to endocrine disorders: Secondary | ICD-10-CM

## 2017-06-07 DIAGNOSIS — I10 Essential (primary) hypertension: Secondary | ICD-10-CM

## 2017-06-07 DIAGNOSIS — I428 Other cardiomyopathies: Secondary | ICD-10-CM | POA: Diagnosis not present

## 2017-06-07 HISTORY — DX: Major depressive disorder, recurrent, in partial remission: F33.41

## 2017-06-07 HISTORY — DX: Hypertension secondary to endocrine disorders: I15.2

## 2017-06-07 MED ORDER — TRAZODONE HCL 50 MG PO TABS: 50.0000 mg | ORAL_TABLET | Freq: Every day | ORAL | 3 refills | Status: DC

## 2017-06-07 NOTE — Patient Instructions (Addendum)
BEFORE YOU LEAVE: -phq9 -flu shot -IT exercises -labs -follow up: AWV with Manuela Schwartz and follow up Dr. [name] for leg pain in 1 month  -We placed a referral for you as discussed. It usually takes about 1-2 weeks to process and schedule this referral. If you have not heard from [name] regarding this appointment in 2 weeks please contact our office.  -We have ordered labs or studies at this visit. It can take up to 1-2 weeks for results and processing. IF results require follow up or explanation, we will call you with instructions. Clinically stable results will be released to your [facility]. If you have not heard from [name] or cannot find your results in [facility] in 2 weeks please contact our office at [phone].  If you are not yet signed up for [facility], please consider signing up.

## 2017-06-07 NOTE — Addendum Note (Signed)
Addended by: Agnes Lawrence on: 06/07/2017 03:43 PM   Modules accepted: Orders

## 2017-06-08 LAB — BASIC METABOLIC PANEL
BUN: 54 mg/dL — ABNORMAL HIGH (ref 6–23)
CALCIUM: 9.2 mg/dL (ref 8.4–10.5)
CO2: 26 mEq/L (ref 19–32)
Chloride: 107 mEq/L (ref 96–112)
Creatinine, Ser: 1.76 mg/dL — ABNORMAL HIGH (ref 0.40–1.20)
GFR: 36.78 mL/min — AB (ref >60.00)
GLUCOSE: 97 mg/dL (ref 70–99)
Potassium: 4.3 mEq/L (ref 3.5–5.1)
Sodium: 140 mEq/L (ref 135–145)

## 2017-06-08 LAB — CBC
HCT: 44.8 % (ref 36.0–46.0)
Hemoglobin: 14.7 g/dL (ref 12.0–15.0)
MCHC: 32.9 g/dL (ref 30.0–36.0)
MCV: 89.1 fl (ref 78.0–100.0)
Platelets: 180 10*3/uL (ref 150.0–400.0)
RBC: 5.03 Mil/uL (ref 3.87–5.11)
RDW: 16.1 % — AB (ref 11.5–15.5)
WBC: 5.6 10*3/uL (ref 4.0–10.5)

## 2017-06-08 LAB — TSH: TSH: 0.33 u[IU]/mL — ABNORMAL LOW (ref 0.35–4.50)

## 2017-06-08 LAB — HEMOGLOBIN A1C: HEMOGLOBIN A1C: 6.8 % — AB (ref 4.6–6.5)

## 2017-06-12 MED ORDER — LEVOTHYROXINE SODIUM 125 MCG PO TABS: 125.0000 ug | ORAL_TABLET | Freq: Every day | ORAL | 0 refills | Status: DC

## 2017-06-12 NOTE — Addendum Note (Signed)
Addended by: Agnes Lawrence on: 06/12/2017 10:28 AM   Modules accepted: Orders

## 2017-06-12 NOTE — Addendum Note (Signed)
Addended by: Agnes Lawrence on: 06/12/2017 10:29 AM   Modules accepted: Orders

## 2017-06-13 ENCOUNTER — Encounter: Payer: Self-pay | Admitting: Nurse Practitioner

## 2017-06-13 ENCOUNTER — Ambulatory Visit: Payer: Medicare Other | Admitting: Nurse Practitioner

## 2017-06-13 VITALS — BP 100/48 | HR 82 | Ht 59.0 in | Wt 153.0 lb

## 2017-06-13 DIAGNOSIS — I5022 Chronic systolic (congestive) heart failure: Secondary | ICD-10-CM

## 2017-06-13 DIAGNOSIS — I481 Persistent atrial fibrillation: Secondary | ICD-10-CM | POA: Diagnosis not present

## 2017-06-13 DIAGNOSIS — I4819 Other persistent atrial fibrillation: Secondary | ICD-10-CM

## 2017-06-13 LAB — CUP PACEART INCLINIC DEVICE CHECK
Date Time Interrogation Session: 20190206120214
Implantable Lead Implant Date: 20080818
Implantable Lead Location: 753859
Implantable Lead Location: 753860
Implantable Lead Model: 4193
Implantable Pulse Generator Implant Date: 20170531
MDC IDC LEAD IMPLANT DT: 20080818
MDC IDC LEAD IMPLANT DT: 20080818
MDC IDC LEAD LOCATION: 753858

## 2017-06-13 NOTE — Progress Notes (Signed)
Electrophysiology Office Note Date: 06/17/2017  ID:  Morgan Clay, DOB Sep 26, 1945, MRN [id number]  PCP: Morgan Kern, DO Electrophysiologist: Caryl Comes  CC: Routine ICD follow-up  Morgan Clay is a 72 y.o. female seen today for Dr Caryl Comes.  She has been found to have recurrent persistent atrial fibrillation on remotes and was added to my schedule today for evaluation.  She reports doing well, but with further questioning admits to being "worn out" when making her bed.  She denies chest pain, palpitations, PND, orthopnea, nausea, vomiting, dizziness, syncope, edema, weight gain, or early satiety.  She has not had ICD shocks.    Past Medical History:  Diagnosis Date  . Arthritis   . Asthma    reports mild asthma since childhood - had COPD on dx list from prior PCP  . Atypical atrial flutter ([place]) 09/28/2015  . Chronic systolic congestive heart failure ([place])   . DEPRESSION 12/17/2009   Annotation: PHQ-9 score = 14 done on 12/17/2009 Qualifier: Diagnosis of  By: Hassell Done FNP, Tori Milks    . FIBROIDS, UTERUS 03/05/2008  . Glaucoma   . HEPATITIS C - s/p treatment with Harvoni, seeing hepatology, Morgan Clay 01/04/2007  . Hyperlipemia 12/06/2012  . HYPERTENSION, BENIGN 04/24/2007  . Hypothyroidism   . LBBB (left bundle branch block)   . Nonischemic cardiomyopathy ([place])   . OBESITY 05/27/2009  . OBSTRUCTIVE SLEEP APNEA 11/14/2007   npsg 2009:  Mild osa with AHI 11/hr. Started cpap 2009, quit using due to lack of response Home sleep testing 09/2010 with AHI only 7/hr and weight loss advised by pulmonology.  . OSTEOPENIA 09/30/2008  . Personal history of other infectious and parasitic disease    Hepatitis B   Past Surgical History:  Procedure Laterality Date  . appendectomy    . CARDIAC CATHETERIZATION    . CARDIOVERSION N/A 12/14/2014   Procedure: CARDIOVERSION;  Surgeon: Dorothy Spark, MD;  Location: [facility] ENDOSCOPY;  Service: Cardiovascular;  Laterality: N/A;  . CARDIOVERSION N/A  02/26/2017   Procedure: CARDIOVERSION;  Surgeon: Evans Lance, MD;  Location: [place] CV LAB;  Service: Cardiovascular;  Laterality: N/A;  . EP IMPLANTABLE DEVICE N/A 10/06/2015   Procedure: BIV ICD Generator Changeout;  Surgeon: [name], MD;  Location: [place] CV LAB;  Service: Cardiovascular;  Laterality: N/A;    Current Outpatient Medications  Medication Sig Dispense Refill  . albuterol (PROVENTIL HFA;VENTOLIN HFA) 108 (90 BASE) MCG/ACT inhaler Inhale 2 puffs into the lungs every 6 (six) hours as needed for wheezing or shortness of breath. 1 Inhaler 3  . apixaban (ELIQUIS) 5 MG TABS tablet Take 1 tablet (5 mg total) by mouth 2 (two) times daily. 180 tablet 3  . BIDIL 20-37.5 MG tablet TAKE 1 TABLET BY MOUTH TWICE DAILY 180 tablet 3  . ENTRESTO 97-103 MG TAKE 1 TABLET BY MOUTH TWICE DAILY **TITRATED  DOSE  UP** 60 tablet 10  . fluticasone (FLONASE) 50 MCG/ACT nasal spray Place 2 sprays into both nostrils daily. 16 g 6  . furosemide (LASIX) 40 MG tablet Take 60 mg by mouth daily.    [name] levothyroxine (SYNTHROID, LEVOTHROID) 125 MCG tablet Take 1 tablet (125 mcg total) by mouth daily. 90 tablet 0  . nitroGLYCERIN (NITROSTAT) 0.4 MG SL tablet Place 0.4 mg under the tongue every 5 (five) minutes as needed for chest pain.    [name] sertraline (ZOLOFT) 100 MG tablet Take 1 tablet (100 mg total) by mouth daily. 90 tablet 1  . spironolactone (  ALDACTONE) 25 MG tablet Take 1 tablet (25 mg total) daily by mouth. 90 tablet 3  . traZODone (DESYREL) 50 MG tablet Take 1 tablet (50 mg total) by mouth at bedtime. 90 tablet 3  . carvedilol (COREG) 25 MG tablet Take 1 tablet (25 mg total) by mouth 2 (two) times daily. 180 tablet 3   No current facility-administered medications for this visit.     Allergies:   Patient has no known allergies.   Social History: Social History   Socioeconomic History  . Marital status: Single    Spouse name: Not on file  . Number of children: Not on file  .  Years of education: Not on file  . Highest education level: Not on file  Social Needs  . Financial resource strain: Not on file  . Food insecurity - worry: Not on file  . Food insecurity - inability: Not on file  . Transportation needs - medical: Not on file  . Transportation needs - non-medical: Not on file  Occupational History  . Not on file  Tobacco Use  . Smoking status: Never Smoker  . Smokeless tobacco: Never Used  Substance and Sexual Activity  . Alcohol use: Yes    Comment: 3 glasses of wine 3-4 times per week  . Drug use: No  . Sexual activity: Not on file  Other Topics Concern  . 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 Asthma Father   . Heart attack Father   . Asthma Sister   . Cancer Sister        lung  . Heart attack Mother   . Stroke Brother   . Diabetes Unknown     Review of Systems: All other systems reviewed and are otherwise negative except as noted above.   Physical Exam: VS:  BP (!) 100/48   Pulse 82   Ht 4\' 11"  (1.499 m)   Wt 153 lb (69.4 kg)   SpO2 98%   BMI 30.90 kg/m  , BMI Body mass index is 30.9 kg/m.  GEN- The patient is well appearing, alert and oriented x 3 today.   HEENT: normocephalic, atraumatic; sclera clear, conjunctiva pink; hearing intact; oropharynx clear; neck supple Lungs- Clear to ausculation bilaterally, normal work of breathing.  No wheezes, rales, rhonchi Heart- Regular rate and rhythm (paced) GI- soft, non-tender, non-distended, bowel sounds present Extremities- no clubbing, cyanosis, or edema MS- no significant deformity or atrophy Skin- warm and dry, no rash or lesion; ICD pocket well healed Psych- euthymic mood, full affect Neuro- strength and sensation are  intact  ICD interrogation- reviewed in detail today,  See PACEART report  EKG:  EKG is not ordered today.  Recent Labs: 02/22/2017: B Natriuretic Peptide 1,275.8 03/01/2017: Magnesium 2.2 06/07/2017: BUN 54; Creatinine, Ser 1.76; Hemoglobin 14.7; Platelets 180.0; Potassium 4.3; Sodium 140; TSH 0.33   Wt Readings from Last 3 Encounters:  06/13/17 153 lb (69.4 kg)  06/07/17 149 lb 11.2 oz (67.9 kg)  05/22/17 152 lb (68.9 kg)     Other studies Reviewed: Additional studies/ records that were reviewed today include: Dr Olin Pia office notes  Assessment and Plan:  1.  Chronic systolic dysfunction euvolemic today Stable on an appropriate medical regimen Normal ICD function See Pace Art report No changes today  2.  Persistent atrial fibrillation Symptomatic Continue [place] long term for CHADS2VASC of at least 4 Unfortunately, our AAD options are limited with baseline CKD, heart failure, and hypothyroidism Will discuss with Dr Caryl Comes - amiodarone may be best option with close thyroid follow up. Will send message to him and his nurse today to review.    Current medicines are reviewed at length with the patient today.   The patient does not have concerns regarding her medicines.  The following changes were made today:  none  Labs/ tests ordered today include: none Orders Placed This Encounter  Procedures  . CUP PACEART INCLINIC DEVICE CHECK     Disposition:   Follow up with Dr Caryl Comes 3 months   Signed, Chanetta Marshall, NP 06/17/2017 8:56 AM  [place]  [place] [address] [phone] (office) [phone] (fax)

## 2017-06-13 NOTE — Patient Instructions (Addendum)
Medication Instructions:   Your physician recommends that you continue on your current medications as directed. Please refer to the Current Medication list given to you today.   If you need a refill on your cardiac medications before your next appointment, please call your pharmacy.  Labwork: NONE ORDERED  TODAY    Testing/Procedures: NONE ORDERED  TODAY    Follow-Up: . YOU WILL BE CONTACTED BACK FOR FURTHER STEPS    Any Other Special Instructions Will Be Listed Below (If Applicable).

## 2017-06-26 ENCOUNTER — Telehealth: Payer: Self-pay

## 2017-06-26 ENCOUNTER — Ambulatory Visit (INDEPENDENT_AMBULATORY_CARE_PROVIDER_SITE_OTHER): Payer: Medicare Other

## 2017-06-26 ENCOUNTER — Encounter: Payer: Self-pay | Admitting: Family Medicine

## 2017-06-26 ENCOUNTER — Ambulatory Visit (INDEPENDENT_AMBULATORY_CARE_PROVIDER_SITE_OTHER): Payer: Medicare Other | Admitting: Family Medicine

## 2017-06-26 VITALS — BP 120/60 | HR 68 | Ht 61.0 in | Wt 150.0 lb

## 2017-06-26 DIAGNOSIS — Z0000 Encounter for general adult medical examination without abnormal findings: Secondary | ICD-10-CM

## 2017-06-26 DIAGNOSIS — M79604 Pain in right leg: Secondary | ICD-10-CM

## 2017-06-26 DIAGNOSIS — F3341 Major depressive disorder, recurrent, in partial remission: Secondary | ICD-10-CM

## 2017-06-26 MED ORDER — SERTRALINE HCL 50 MG PO TABS: 50.0000 mg | ORAL_TABLET | Freq: Every day | ORAL | 3 refills | Status: DC

## 2017-06-26 MED ORDER — AMIODARONE HCL 200 MG PO TABS: ORAL_TABLET | ORAL | 3 refills | Status: DC

## 2017-06-26 NOTE — Progress Notes (Signed)
Subjective:   Morgan Clay is a 72 y.o. female who presents for Medicare Annual/Subsequent preventive examination.  Reports health as question Has been through so much  Just sent a rx Amiodarone  From cardiology and is picking up today    Lives with dog "Ovid Curd"  One dtr Son deceased  Has grand - 3  Great grands 57 and 08-02-22;  Son died un-expectantly x 62yo   One level home Shower handicapped  Good neighbors   Diet Lipids cho/hdl 1.8 Diabetes BS 97 and A1c 6.8  Does not "have Diabetes'  BMI 28  Bowl of cereal and coffee Usually has late breakfast and last dinner Dinner; depends on appetite     Exercise Takes her time to do her housekeeping Hard to make the bed  To schedule an eye exam Can't see "a lick"    There are no preventive care reminders to display for this patient. Possibly due 06/12/2017 Colonoscopy 07/2009;  Mammogram 11/2016 Bone density 05/2012 (-1.1)    ETOH at least 2 glasses of wine; Noted that at times it may trigger  atrial fib  Tobacco; Never smoked;    Goes to be bed early at 9am Sleeps to 4 to 5am   Cardiac Risk Factors include: advanced age (>78men, >48 women);dyslipidemia;hypertension;family history of premature cardiovascular disease        Objective:    Vitals: BP 120/60   Pulse 68   Ht 5\' 1"  (1.549 m)   Wt 150 lb (68 kg)   SpO2 98%   BMI 28.34 kg/m   Body mass index is 28.34 kg/m.  Advanced Directives 06/26/2017 02/23/2017 02/22/2017 10/06/2015 12/28/2014 12/14/2014  Does Patient Have a Medical Advance Directive? No No No No No No  Would patient like information on creating a medical advance directive? - No - Patient declined - - Yes - Educational materials given No - patient declined information   dtr knows what she wants [place] History   Tobacco Use  Smoking Status Never Smoker  Smokeless Tobacco Never Used     Counseling given: Yes   Clinical Intake:   Past Medical History:  Diagnosis  Date  . Arthritis   . Asthma    reports mild asthma since childhood - had COPD on dx list from prior PCP  . Atypical atrial flutter ([place]) 09/28/2015  . Chronic systolic congestive heart failure ([place])   . DEPRESSION 12/17/2009   Annotation: PHQ-9 score = 14 done on 12/17/2009 Qualifier: Diagnosis of  By: Hassell Done FNP, Tori Milks    . FIBROIDS, UTERUS 03/05/2008  . Glaucoma   . HEPATITIS C - s/p treatment with Harvoni, seeing hepatology, Dawn Drazek 01/04/2007  . Hyperlipemia 12/06/2012  . HYPERTENSION, BENIGN 04/24/2007  . Hypothyroidism   . LBBB (left bundle branch block)   . Nonischemic cardiomyopathy ([place])   . OBESITY 05/27/2009  . OBSTRUCTIVE SLEEP APNEA 11/14/2007   npsg 2009:  Mild osa with AHI 11/hr. Started cpap 2009, quit using due to lack of response Home sleep testing 09/2010 with AHI only 7/hr and weight loss advised by pulmonology.  . OSTEOPENIA 09/30/2008  . Personal history of other infectious and parasitic disease    Hepatitis B   Past Surgical History:  Procedure Laterality Date  . appendectomy    . CARDIAC CATHETERIZATION    . CARDIOVERSION N/A 12/14/2014   Procedure: CARDIOVERSION;  Surgeon: Dorothy Spark, MD;  Location: [facility] ENDOSCOPY;  Service: Cardiovascular;  Laterality: N/A;  . CARDIOVERSION N/A 02/26/2017  Procedure: CARDIOVERSION;  Surgeon: Evans Lance, MD;  Location: [place] CV LAB;  Service: Cardiovascular;  Laterality: N/A;  . EP IMPLANTABLE DEVICE N/A 10/06/2015   Procedure: BIV ICD Generator Changeout;  Surgeon: [name], MD;  Location: [place] CV LAB;  Service: Cardiovascular;  Laterality: N/A;   Family History  Problem Relation Age of Onset  . Asthma Father   . Heart attack Father   . Asthma Sister   . Cancer Sister        lung  . Heart attack Mother   . Stroke Brother   . Diabetes Unknown    Social History   Socioeconomic History  . Marital status: Single    Spouse name: Not on file  . Number of children: Not on file  . Years  of education: Not on file  . Highest education level: Not on file  Social Needs  . Financial resource strain: Not on file  . Food insecurity - worry: Not on file  . Food insecurity - inability: Not on file  . Transportation needs - medical: Not on file  . Transportation needs - non-medical: Not on file  Occupational History  . Not on file  Tobacco Use  . Smoking status: Never Smoker  . Smokeless tobacco: Never Used  Substance and Sexual Activity  . Alcohol use: Yes    Comment: 3 glasses of wine 3-4 times per week  . Drug use: No  . Sexual activity: Not on file  Other Topics Concern  . 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 apixaban (ELIQUIS) 5 MG TABS tablet Take 1 tablet (5 mg total) by mouth 2 (two) times daily.  [name] BIDIL 20-37.5 MG tablet TAKE 1 TABLET BY MOUTH TWICE DAILY  . ENTRESTO 97-103 MG TAKE 1 TABLET BY MOUTH TWICE DAILY **TITRATED  DOSE  UP**  . fluticasone (FLONASE) 50 MCG/ACT nasal spray Place 2 sprays into both nostrils daily.  . furosemide (LASIX) 40 MG tablet Take 60 mg by mouth daily.  [name] levothyroxine (SYNTHROID, LEVOTHROID) 125 MCG tablet Take 1 tablet (125 mcg total) by mouth daily.  . nitroGLYCERIN (NITROSTAT) 0.4 MG SL tablet Place 0.4 mg under the tongue every 5 (five) minutes as needed for chest pain.  [name] sertraline (ZOLOFT) 100 MG tablet Take 1 tablet (100 mg total) by mouth daily.  [name] spironolactone (ALDACTONE) 25 MG tablet Take 1 tablet (25 mg total) daily by mouth.  . traZODone (DESYREL) 50 MG tablet Take 1 tablet (50 mg total) by mouth at bedtime.  [name] albuterol (PROVENTIL HFA;VENTOLIN HFA) 108 (90 BASE) MCG/ACT inhaler Inhale 2 puffs into the lungs every 6 (six) hours as needed for wheezing or shortness  of breath. (Patient not taking: Reported on 06/26/2017)  . amiodarone (PACERONE) 200 MG tablet 2/20-2/26 take 2 tablets, 400mg  twice/day 2/27-3/19- take 1 tablet, 200mg  twice/day March 20- one tablet, 200mg  daily  . carvedilol (COREG) 25 MG tablet Take 1 tablet (25 mg total) by mouth 2 (two) times daily.   No facility-administered encounter medications on file as of 06/26/2017.     Activities of Daily Living In your present state of  health, do you have any difficulty performing the following activities: 06/26/2017 02/23/2017  Hearing? N N  Vision? N N  Difficulty concentrating or making decisions? N N  Walking or climbing stairs? Y N  Dressing or bathing? N N  Doing errands, shopping? Tempie Donning  Comment has to break them up  -  [employer] and eating ? N -  Using the Toilet? N -  In the past six months, have you accidently leaked urine? N -  Do you have problems with loss of bowel control? N -  Managing your Medications? N -  Managing your Finances? N -  Housekeeping or managing your Housekeeping? N -  Some recent data might be hidden    Patient Care Team: [name], DO as PCP - General (Family Medicine)   Assessment:   This is a routine wellness examination for Morgan Clay.  Exercise Activities and Dietary recommendations Current Exercise Habits: The patient does not participate in regular exercise at present, Exercise limited by: cardiac condition(s)  Goals    . Patient Stated     Feel better this year     . 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 No No     Depression Screen PHQ 2/9 Scores 06/26/2017 06/07/2017 03/06/2017 02/25/2016  PHQ - 2 Score - 4 1 0  PHQ- 9 Score - 11 8 -  Exception Documentation Patient refusal - - -   Jan, Feb and March are bad months Feb is the last time she seen him For most of the time he was in [place] and  she was here.  20 years; He was 28  Cognitive Function MMSE - Mini Mental State Exam 06/26/2017  Not completed: (No Data)   Ad8 score reviewed for issues:  Issues making decisions:  Less interest in hobbies / activities:  Repeats questions, stories (family complaining):  Trouble using ordinary gadgets (microwave, computer, phone):  Forgets the month or year:   Mismanaging finances:   Remembering appts:  Daily problems with thinking and/or memory: Ad8 score is=0       Immunization History  Administered Date(s) Administered  . Influenza Whole 04/23/2008, 04/07/2010  . Influenza, High Dose Seasonal PF 02/25/2016, 06/07/2017  . Influenza,inj,Quad PF,6+ Mos 01/08/2013, 02/24/2014  . Pneumococcal Conjugate-13 08/25/2013  . Pneumococcal Polysaccharide-23 08/26/2014  . Td 05/08/2006, 05/30/2016  . Zoster 01/08/2013      Screening Tests Health Maintenance  Topic Date Due  . OPHTHALMOLOGY EXAM  06/26/2018 (Originally 06/12/2017)  . HEMOGLOBIN A1C  12/05/2017  . FOOT EXAM  06/07/2018  . MAMMOGRAM  11/16/2018  . COLONOSCOPY  07/16/2019  . TETANUS/TDAP  05/30/2026  . INFLUENZA VACCINE  Completed  . DEXA SCAN  Completed  . Hepatitis C Screening  Completed  . PNA vac Low Risk Adult  Completed       Plan:      PCP Notes   Health Maintenance  Just ordered amiodarone to pick up today. Referred to her pharmacist for questions  Discussed last A1c was elevated  States she is "borderline" but not diabetic Reviewed the numbers with her Could have been due to stress over illness in the last few months But stated Dr. [name] will fup with another blood test in the future as she is very stressed as  well as over the anniversary date of Son's death 69 yo in 08/15/22 as well as her atrial fib and new medicine.  Declines dexa due to heart issues for now. Feels like she does not want to add on anything. Will review next year  Also educated regarding shingrix  Will defer this for  now as well   Colonoscopy due 08-15-2019   Abnormal Screens  Being treated for atrial fib   Referrals  Mentioned counseling but does not feel she needs it. Has grand-dtr who is starting to want to see her; deceased son's dtr.   Patient concerns; Had vertigo early this am States she turned over and head felt dizzy and the she vomited  Has not had Zoloft since January; need to refill   Nurse Concerns; As noted   Next PCP apt Dr. [name] to see today     I have personally reviewed and noted the following in the patient's chart:   . Medical and social history . Use of alcohol, tobacco or illicit drugs  . Current medications and supplements . Functional ability and status . Nutritional status . Physical activity . Advanced directives . List of other physicians . Hospitalizations, surgeries, and ER visits in previous 12 months . Vitals . Screenings to include cognitive, depression, and falls . Referrals and appointments  In addition, I have reviewed and discussed with patient certain preventive protocols, quality metrics, and best practice recommendations. A written personalized care plan for preventive services as well as general preventive health recommendations were provided to patient.     Wynetta Fines, RN  06/26/2017

## 2017-06-26 NOTE — Telephone Encounter (Signed)
-----   Message from Patsey Berthold, NP sent at 06/26/2017  9:58 AM EST ----- I would let Butch Penny schedule when she sees her back for AF clinic follow up.    Thank you! amber ----- Message ----- From: Dollene Primrose, RN Sent: 06/26/2017   9:07 AM To: Patsey Berthold, NP  Ok np!  Just for clarification, would you like to schedule the DCCV for 4 weeks after her 1 wk of 400mg  bid or 4 weeks after she begins her 200mg  qd?  Thanks!  Mende Biswell  ----- Message ----- From: Patsey Berthold, NP Sent: 06/25/2017   6:03 PM To: Juluis Mire, RN, [name], MD, #  Thanks - she has had HF exacerbation with AF before  Kenyada Dosch - can you call her and let her know that we are going to try amio to restore SR?  Would do amio 400mg  twice daily for 1 week then 200mg  twice daily for 3 weeks then 200mg  daily. She will need close thyroid follow up with PCP.  She will also need to be scheduled for DCCV in about 4 weeks after load - she can be seen by the AF clinic to arrange in about 3 weeks.   [place]  Thanks! Amber ----- Message ----- From: [name], MD Sent: 06/25/2017   5:55 PM To: Patsey Berthold, NP  Great question and I am not sure we are going to bet much out of this.  Amio is, I agree best bet.  LA is severely enlarged, while linear measurements is only 43, the volume is almost 50 Thanks for thoughtfulness  ----- Message ----- From: Patsey Berthold, NP Sent: 06/17/2017   9:04 AM To: [name], MD, Dollene Primrose, RN  Please see my note from last week  - do you want to try amio? I don't know of other good AAD options.

## 2017-06-26 NOTE — Telephone Encounter (Signed)
Contacted patient and reviewed Amiodarone loading orders and plan for cardioversion. I explained dosing and side effects of amiodarone. Pt has f/u appointment with Dr Caryl Comes on 3/14 and plans to discuss cardioversion at that time. Pt had no further questions at this time.

## 2017-06-26 NOTE — Patient Instructions (Addendum)
Morgan Clay , Thank you for taking time to come for your Medicare Wellness Visit. I appreciate your ongoing commitment to your health goals. Please review the following plan we discussed and let me know if I can assist you in the future.   To make you eye apt   Shingrix is a vaccine for the prevention of Shingles in Adults 50 and older.  If you are on Medicare, you can request a prescription from your doctor to be filled at a pharmacy.  Please check with your benefits regarding applicable copays or out of pocket expenses.  The Shingrix is given in 2 vaccines approx 8 weeks apart. You must receive the 2nd dose prior to 6 months from receipt of the first.   Dexa to repeat in the future. Will defer for now due to ongoing issues   These are the goals we discussed: Goals    . Patient Stated     Feel better this year        This is a list of the screening recommended for you and due dates:  Health Maintenance  Topic Date Due  . Eye exam for diabetics  06/12/2017  . Hemoglobin A1C  12/05/2017  . Complete foot exam   06/07/2018  . Mammogram  11/16/2018  . Colon Cancer Screening  07/16/2019  . Tetanus Vaccine  05/30/2026  . Flu Shot  Completed  . DEXA scan (bone density measurement)  Completed  .  Hepatitis C: One time screening is recommended by Center for Disease Control  (CDC) for  adults born from [birth year] through 1963.   Completed  . Pneumonia vaccines  Completed   Prevention of falls: Remove rugs or any tripping hazards in the home Use Non slip mats in bathtubs and showers Placing grab bars next to the toilet and or shower Placing handrails on both sides of the stair way Adding extra lighting in the home.   Personal safety issues reviewed:  1. Consider starting a community watch program per [facility] 2.  Changes batteries is smoke detector and/or carbon monoxide detector  3.  If you have firearms; keep them in a safe place 4.  Wear protection when in the sun;  Always wear sunscreen or a hat; It is good to have your doctor check your skin annually or review any new areas of concern 5. Driving safety; Keep in the right lane; stay 3 car lengths behind the car in front of you on the highway; look 3 times prior to pulling out; carry your cell phone everywhere you go!    Learn about the Yellow Dot program:  The program allows first responders at your emergency to have access to who your physician is, as well as your medications and medical conditions.  Citizens requesting the Yellow Dot Packages should contact Master Corporal Nunzio Cobbs at the [facility] [phone] for the first week of the program and beginning the week after Easter citizens should contact their Scientist, physiological.     Health Maintenance for Postmenopausal Women Menopause is a normal process in which your reproductive ability comes to an end. This process happens gradually over a span of months to years, usually between the ages of 23 and 29. Menopause is complete when you have missed 12 consecutive menstrual periods. It is important to talk with your health care provider about some of the most common conditions that affect postmenopausal women, such as heart disease, cancer, and bone loss (osteoporosis). Adopting a  healthy lifestyle and getting preventive care can help to promote your health and wellness. Those actions can also lower your chances of developing some of these common conditions. What should I know about menopause? During menopause, you may experience a number of symptoms, such as:  Moderate-to-severe hot flashes.  Night sweats.  Decrease in sex drive.  Mood swings.  Headaches.  Tiredness.  Irritability.  Memory problems.  Insomnia.  Choosing to treat or not to treat menopausal changes is an individual decision that you make with your health care provider. What should I know about hormone replacement therapy and  supplements? Hormone therapy products are effective for treating symptoms that are associated with menopause, such as hot flashes and night sweats. Hormone replacement carries certain risks, especially as you become older. If you are thinking about using estrogen or estrogen with progestin treatments, discuss the benefits and risks with your health care provider. What should I know about heart disease and stroke? Heart disease, heart attack, and stroke become more likely as you age. This may be due, in part, to the hormonal changes that your body experiences during menopause. These can affect how your body processes dietary fats, triglycerides, and cholesterol. Heart attack and stroke are both medical emergencies. There are many things that you can do to help prevent heart disease and stroke:  Have your blood pressure checked at least every 1-2 years. High blood pressure causes heart disease and increases the risk of stroke.  If you are 62-23 years old, ask your health care provider if you should take aspirin to prevent a heart attack or a stroke.  Do not use any tobacco products, including cigarettes, chewing tobacco, or electronic cigarettes. If you need help quitting, ask your health care provider.  It is important to eat a healthy diet and maintain a healthy weight. ? Be sure to include plenty of vegetables, fruits, low-fat dairy products, and lean protein. ? Avoid eating foods that are high in solid fats, added sugars, or salt (sodium).  Get regular exercise. This is one of the most important things that you can do for your health. ? Try to exercise for at least 150 minutes each week. The type of exercise that you do should increase your heart rate and make you sweat. This is known as moderate-intensity exercise. ? Try to do strengthening exercises at least twice each week. Do these in addition to the moderate-intensity exercise.  Know your numbers.Ask your health care provider to check  your cholesterol and your blood glucose. Continue to have your blood tested as directed by your health care provider.  What should I know about cancer screening? There are several types of cancer. Take the following steps to reduce your risk and to catch any cancer development as early as possible. Breast Cancer  Practice breast self-awareness. ? This means understanding how your breasts normally appear and feel. ? It also means doing regular breast self-exams. Let your health care provider know about any changes, no matter how small.  If you are 19 or older, have a clinician do a breast exam (clinical breast exam or CBE) every year. Depending on your age, family history, and medical history, it may be recommended that you also have a yearly breast X-ray (mammogram).  If you have a family history of breast cancer, talk with your health care provider about genetic screening.  If you are at high risk for breast cancer, talk with your health care provider about having an MRI and a mammogram  every year.  Breast cancer (BRCA) gene test is recommended for women who have family members with BRCA-related cancers. Results of the assessment will determine the need for genetic counseling and BRCA1 and for BRCA2 testing. BRCA-related cancers include these types: ? Breast. This occurs in males or females. ? Ovarian. ? Tubal. This may also be called fallopian tube cancer. ? Cancer of the abdominal or pelvic lining (peritoneal cancer). ? Prostate. ? Pancreatic.  Cervical, Uterine, and Ovarian Cancer Your health care provider may recommend that you be screened regularly for cancer of the pelvic organs. These include your ovaries, uterus, and vagina. This screening involves a pelvic exam, which includes checking for microscopic changes to the surface of your cervix (Pap test).  For women ages 21-65, health care providers may recommend a pelvic exam and a Pap test every three years. For women ages 50-65,  they may recommend the Pap test and pelvic exam, combined with testing for human papilloma virus (HPV), every five years. Some types of HPV increase your risk of cervical cancer. Testing for HPV may also be done on women of any age who have unclear Pap test results.  Other health care providers may not recommend any screening for nonpregnant women who are considered low risk for pelvic cancer and have no symptoms. Ask your health care provider if a screening pelvic exam is right for you.  If you have had past treatment for cervical cancer or a condition that could lead to cancer, you need Pap tests and screening for cancer for at least 20 years after your treatment. If Pap tests have been discontinued for you, your risk factors (such as having a new sexual partner) need to be reassessed to determine if you should start having screenings again. Some women have medical problems that increase the chance of getting cervical cancer. In these cases, your health care provider may recommend that you have screening and Pap tests more often.  If you have a family history of uterine cancer or ovarian cancer, talk with your health care provider about genetic screening.  If you have vaginal bleeding after reaching menopause, tell your health care provider.  There are currently no reliable tests available to screen for ovarian cancer.  Lung Cancer Lung cancer screening is recommended for adults 40-33 years old who are at high risk for lung cancer because of a history of smoking. A yearly low-dose CT scan of the lungs is recommended if you:  Currently smoke.  Have a history of at least 30 pack-years of smoking and you currently smoke or have quit within the past 15 years. A pack-year is smoking an average of one pack of cigarettes per day for one year.  Yearly screening should:  Continue until it has been 15 years since you quit.  Stop if you develop a health problem that would prevent you from having lung  cancer treatment.  Colorectal Cancer  This type of cancer can be detected and can often be prevented.  Routine colorectal cancer screening usually begins at age 77 and continues through age 60.  If you have risk factors for colon cancer, your health care provider may recommend that you be screened at an earlier age.  If you have a family history of colorectal cancer, talk with your health care provider about genetic screening.  Your health care provider may also recommend using home test kits to check for hidden blood in your stool.  A small camera at the end of a tube can  be used to examine your colon directly (sigmoidoscopy or colonoscopy). This is done to check for the earliest forms of colorectal cancer.  Direct examination of the colon should be repeated every 5-10 years until age 80. However, if early forms of precancerous polyps or small growths are found or if you have a family history or genetic risk for colorectal cancer, you may need to be screened more often.  Skin Cancer  Check your skin from head to toe regularly.  Monitor any moles. Be sure to tell your health care provider: ? About any new moles or changes in moles, especially if there is a change in a mole's shape or color. ? If you have a mole that is larger than the size of a pencil eraser.  If any of your family members has a history of skin cancer, especially at a young age, talk with your health care provider about genetic screening.  Always use sunscreen. Apply sunscreen liberally and repeatedly throughout the day.  Whenever you are outside, protect yourself by wearing long sleeves, pants, a wide-brimmed hat, and sunglasses.  What should I know about osteoporosis? Osteoporosis is a condition in which bone destruction happens more quickly than new bone creation. After menopause, you may be at an increased risk for osteoporosis. To help prevent osteoporosis or the bone fractures that can happen because of  osteoporosis, the following is recommended:  If you are 35-15 years old, get at least 1,000 mg of calcium and at least 600 mg of vitamin D per day.  If you are older than age 19 but younger than age 76, get at least 1,200 mg of calcium and at least 600 mg of vitamin D per day.  If you are older than age 63, get at least 1,200 mg of calcium and at least 800 mg of vitamin D per day.  Smoking and excessive alcohol intake increase the risk of osteoporosis. Eat foods that are rich in calcium and vitamin D, and do weight-bearing exercises several times each week as directed by your health care provider. What should I know about how menopause affects my mental health? Depression may occur at any age, but it is more common as you become older. Common symptoms of depression include:  Low or sad mood.  Changes in sleep patterns.  Changes in appetite or eating patterns.  Feeling an overall lack of motivation or enjoyment of activities that you previously enjoyed.  Frequent crying spells.  Talk with your health care provider if you think that you are experiencing depression. What should I know about immunizations? It is important that you get and maintain your immunizations. These include:  Tetanus, diphtheria, and pertussis (Tdap) booster vaccine.  Influenza every year before the flu season begins.  Pneumonia vaccine.  Shingles vaccine.  Your health care provider may also recommend other immunizations. This information is not intended to replace advice given to you by your health care provider. Make sure you discuss any questions you have with your health care provider. Document Released: 06/16/2005 Document Revised: 11/12/2015 Document Reviewed: 01/26/2015 Elsevier Interactive Patient Education  2018 [place] in the Home Falls can cause injuries and can affect people from all age groups. There are many simple things that you can do to make your home safe and to  help prevent falls. What can I do on the outside of my home?  Regularly repair the edges of walkways and driveways and fix any cracks.  Remove high doorway thresholds.  Trim any shrubbery on the main path into your home.  Use bright outdoor lighting.  Clear walkways of debris and clutter, including tools and rocks.  Regularly check that handrails are securely fastened and in good repair. Both sides of any steps should have handrails.  Install guardrails along the edges of any raised decks or porches.  Have leaves, snow, and ice cleared regularly.  Use sand or salt on walkways during winter months.  In the garage, clean up any spills right away, including grease or oil spills. What can I do in the bathroom?  Use night lights.  Install grab bars by the toilet and in the tub and shower. Do not use towel bars as grab bars.  Use non-skid mats or decals on the floor of the tub or shower.  If you need to sit down while you are in the shower, use a plastic, non-slip stool.  Keep the floor dry. Immediately clean up any water that spills on the floor.  Remove soap buildup in the tub or shower on a regular basis.  Attach bath mats securely with double-sided non-slip rug tape.  Remove throw rugs and other tripping hazards from the floor. What can I do in the bedroom?  Use night lights.  Make sure that a bedside light is easy to reach.  Do not use oversized bedding that drapes onto the floor.  Have a firm chair that has side arms to use for getting dressed.  Remove throw rugs and other tripping hazards from the floor. What can I do in the kitchen?  Clean up any spills right away.  Avoid walking on wet floors.  Place frequently used items in easy-to-reach places.  If you need to reach for something above you, use a sturdy step stool that has a grab bar.  Keep electrical cables out of the way.  Do not use floor polish or wax that makes floors slippery. If you have to  use wax, make sure that it is non-skid floor wax.  Remove throw rugs and other tripping hazards from the floor. What can I do in the stairways?  Do not leave any items on the stairs.  Make sure that there are handrails on both sides of the stairs. Fix handrails that are broken or loose. Make sure that handrails are as long as the stairways.  Check any carpeting to make sure that it is firmly attached to the stairs. Fix any carpet that is loose or worn.  Avoid having throw rugs at the top or bottom of stairways, or secure the rugs with carpet tape to prevent them from moving.  Make sure that you have a light switch at the top of the stairs and the bottom of the stairs. If you do not have them, have them installed. What are some other fall prevention tips?  Wear closed-toe shoes that fit well and support your feet. Wear shoes that have rubber soles or low heels.  When you use a stepladder, make sure that it is completely opened and that the sides are firmly locked. Have someone hold the ladder while you are using it. Do not climb a closed stepladder.  Add color or contrast paint or tape to grab bars and handrails in your home. Place contrasting color strips on the first and last steps.  Use mobility aids as needed, such as canes, walkers, scooters, and crutches.  Turn on lights if it is dark. Replace any light bulbs that burn out.  Set up furniture  so that there are clear paths. Keep the furniture in the same spot.  Fix any uneven floor surfaces.  Choose a carpet design that does not hide the edge of steps of a stairway.  Be aware of any and all pets.  Review your medicines with your healthcare provider. Some medicines can cause dizziness or changes in blood pressure, which increase your risk of falling. Talk with your health care provider about other ways that you can decrease your risk of falls. This may include working with a physical therapist or trainer to improve your strength,  balance, and endurance. This information is not intended to replace advice given to you by your health care provider. Make sure you discuss any questions you have with your health care provider. Document Released: 04/14/2002 Document Revised: 09/21/2015 Document Reviewed: 05/29/2014 Elsevier Interactive Patient Education  [employer].

## 2017-06-26 NOTE — Progress Notes (Signed)
Gared Gillie R Jaleah Lefevre, DO  

## 2017-06-26 NOTE — Progress Notes (Signed)
HPI:  Follow-up leg pain: -Provided home exercises for IT band last visit -Reports she did not do the exercises as a cause pain, she now has more pain in the groin region, feels like she cannot rotate the hip externally -Denies fevers, malaise, weakness or numbness  See Morgan Clay for annual wellness visit today.  Due for regular follow-up in about 3 months.  She has had some increasing depression over the last several months and has not been taking her Zoloft as she was running out of it.  Then she ran out last couple weeks.  No suicidal ideation or thoughts of harm.  She does want to get back on the Zoloft on a regular basis.  Takes trazodone for sleep.  ROS: See pertinent positives and negatives per HPI.  Past Medical History:  Diagnosis Date  . Arthritis   . Asthma    reports mild asthma since childhood - had COPD on dx list from prior PCP  . Atypical atrial flutter ([place]) 09/28/2015  . Chronic systolic congestive heart failure ([place])   . DEPRESSION 12/17/2009   Annotation: PHQ-9 score = 14 done on 12/17/2009 Qualifier: Diagnosis of  By: Hassell Done FNP, Tori Milks    . FIBROIDS, UTERUS 03/05/2008  . Glaucoma   . HEPATITIS C - s/p treatment with Harvoni, seeing hepatology, Dawn Drazek 01/04/2007  . Hyperlipemia 12/06/2012  . HYPERTENSION, BENIGN 04/24/2007  . Hypothyroidism   . LBBB (left bundle branch block)   . Nonischemic cardiomyopathy ([place])   . OBESITY 05/27/2009  . OBSTRUCTIVE SLEEP APNEA 11/14/2007   npsg 2009:  Mild osa with AHI 11/hr. Started cpap 2009, quit using due to lack of response Home sleep testing 09/2010 with AHI only 7/hr and weight loss advised by pulmonology.  . OSTEOPENIA 09/30/2008  . Personal history of other infectious and parasitic disease    Hepatitis B    Past Surgical History:  Procedure Laterality Date  . appendectomy    . CARDIAC CATHETERIZATION    . CARDIOVERSION N/A 12/14/2014   Procedure: CARDIOVERSION;  Surgeon: Dorothy Spark, MD;  Location: [facility]  ENDOSCOPY;  Service: Cardiovascular;  Laterality: N/A;  . CARDIOVERSION N/A 02/26/2017   Procedure: CARDIOVERSION;  Surgeon: Evans Lance, MD;  Location: [place] [facility];  Service: Cardiovascular;  Laterality: N/A;  . EP IMPLANTABLE DEVICE N/A 10/06/2015   Procedure: BIV ICD Generator Changeout;  Surgeon: [name], MD;  Location: [place] [facility];  Service: Cardiovascular;  Laterality: N/A;    Family History  Problem Relation Age of Onset  . Asthma Father   . Heart attack Father   . Asthma Sister   . Cancer Sister        lung  . Heart attack Mother   . Stroke Brother   . Diabetes Unknown     Social History   Socioeconomic History  . Marital status: Single    Spouse name: None  . Number of children: None  . Years of education: None  . Highest education level: None  Social Needs  . Financial resource strain: None  . Food insecurity - worry: None  . Food insecurity - inability: None  . Transportation needs - medical: None  . Transportation needs - non-medical: None  Occupational History  . None  Tobacco Use  . Smoking status: Never Smoker  . Smokeless tobacco: Never Used  Substance and Sexual Activity  . Alcohol use: Yes    Comment: 3 glasses of wine 3-4 times per week  . Drug  use: No  . Sexual activity: None  Other Topics Concern  . None  Social History Narrative   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      Home Situation: lives alone      Spiritual Beliefs: [place]      Lifestyle: no regular exercise; poor diet              Current Outpatient Medications:  .  albuterol (PROVENTIL HFA;VENTOLIN HFA) 108 (90 BASE) MCG/ACT inhaler, Inhale 2 puffs into the lungs every 6 (six) hours as needed for wheezing or shortness of breath. (Patient not taking: Reported on 06/26/2017), Disp: 1 Inhaler, Rfl: 3 .  amiodarone (PACERONE) 200 MG tablet, 2/20-2/26 take 2 tablets, 400mg  twice/day 2/27-3/19- take 1 tablet, 200mg  twice/day March 20- one  tablet, 200mg  daily, Disp: 90 tablet, Rfl: 3 .  apixaban (ELIQUIS) 5 MG TABS tablet, Take 1 tablet (5 mg total) by mouth 2 (two) times daily., Disp: 180 tablet, Rfl: 3 .  BIDIL 20-37.5 MG tablet, TAKE 1 TABLET BY MOUTH TWICE DAILY, Disp: 180 tablet, Rfl: 3 .  carvedilol (COREG) 25 MG tablet, Take 1 tablet (25 mg total) by mouth 2 (two) times daily., Disp: 180 tablet, Rfl: 3 .  ENTRESTO 97-103 MG, TAKE 1 TABLET BY MOUTH TWICE DAILY **TITRATED  DOSE  UP**, Disp: 60 tablet, Rfl: 10 .  fluticasone (FLONASE) 50 MCG/ACT nasal spray, Place 2 sprays into both nostrils daily., Disp: 16 g, Rfl: 6 .  furosemide (LASIX) 40 MG tablet, Take 60 mg by mouth daily., Disp: , Rfl:  .  levothyroxine (SYNTHROID, LEVOTHROID) 125 MCG tablet, Take 1 tablet (125 mcg total) by mouth daily., Disp: 90 tablet, Rfl: 0 .  nitroGLYCERIN (NITROSTAT) 0.4 MG SL tablet, Place 0.4 mg under the tongue every 5 (five) minutes as needed for chest pain., Disp: , Rfl:  .  sertraline (ZOLOFT) 50 MG tablet, Take 1 tablet (50 mg total) by mouth daily., Disp: 30 tablet, Rfl: 3 .  spironolactone (ALDACTONE) 25 MG tablet, Take 1 tablet (25 mg total) daily by mouth., Disp: 90 tablet, Rfl: 3 .  traZODone (DESYREL) 50 MG tablet, Take 1 tablet (50 mg total) by mouth at bedtime., Disp: 90 tablet, Rfl: 3  EXAM:  Vitals:   06/26/17 1502  BP: 120/60  Pulse: 68    Body mass index is 28.34 kg/m.  GENERAL: vitals reviewed and listed above, alert, oriented, appears well hydrated and in no acute distress  HEENT: atraumatic, conjunttiva clear, no obvious abnormalities on inspection of external nose and ears  NECK: no obvious masses on inspection  LUNGS: clear to auscultation bilaterally, no wheezes, rales or rhonchi, good air movement  CV: HRRR, no peripheral edema  MS: moves all extremities without noticeable abnormality, today she has some mild tenderness to palpation over the right hip socket, some over the hip abductors, mild reduction in  external rotation of the hip, normal strength throughout the lower extremities, DTRs equal bilaterally.  PSYCH: pleasant and cooperative, no obvious depression or anxiety  ASSESSMENT AND PLAN:  Discussed the following assessment and plan:  Right leg pain -we discussed possible serious and likely etiologies, workup and treatment, treatment risks and return precautions -after this discussion, Shakeila opted for referral to sports medicine -placed  Recurrent major depressive disorder, in partial remission ([place]) - Plan:  -Zoloft 50 mg  -Follow-up 3 months -Advised follow-up sooner if any worsening or is not improving  -Patient advised to return or notify a doctor immediately  if symptoms worsen or persist or new concerns arise.  There are no Patient Instructions on file for this visit.  [name], DO

## 2017-06-26 NOTE — Addendum Note (Signed)
Addended by: Dollene Primrose on: 06/26/2017 01:00 PM   Modules accepted: Orders

## 2017-07-05 ENCOUNTER — Ambulatory Visit (INDEPENDENT_AMBULATORY_CARE_PROVIDER_SITE_OTHER): Payer: Medicare Other

## 2017-07-05 ENCOUNTER — Other Ambulatory Visit: Payer: Self-pay | Admitting: Internal Medicine

## 2017-07-05 ENCOUNTER — Telehealth: Payer: Self-pay

## 2017-07-05 DIAGNOSIS — Z95810 Presence of automatic (implantable) cardiac defibrillator: Secondary | ICD-10-CM

## 2017-07-05 DIAGNOSIS — I5022 Chronic systolic (congestive) heart failure: Secondary | ICD-10-CM

## 2017-07-05 NOTE — Telephone Encounter (Signed)
Remote ICM transmission received.  Attempted call to patient and left message per DPR to return call.

## 2017-07-05 NOTE — Progress Notes (Signed)
EPIC Encounter for ICM Monitoring  Patient Name: Morgan Clay is a 72 y.o. female Date: 07/05/2017 Primary Care Physican: [name], DO Primary [place] Electrophysiologist: Caryl Comes Dry Weight: Previous weight 146lbs (Baseline140 - 145 lbs) Bi-V Pacing: 98.9%       Attempted call to patient and unable to reach.  Left message to return call.  Transmission reviewed.    Thoracic impedance normal.   Dr Caryl Comes reviewed Afib episodes on report and no changes today.  Patient has office appointment on 3/14  Prescribed dosage: Furosemide 40 mg 1.5tablets(60 mg total)daily.   Labs: 06/07/2017 Creatinine 1.76, BUN 54, Potassium 4.3, Sodium 140, EGFR 36.78 03/06/2017 Creatinine1.53, BUN28, Potassium4.2, Sodium140, EGFR34-40 03/01/2017 Creatinine1.64, BUN39, Potassium3.7, [id number], [id number]  02/28/2017 Creatinine1.82, BUN40, Potassium4.2, Sodium134, DGNP54-30  02/27/2017 Creatinine1.69, BUN33, Potassium3.6, Sodium135, EGFR30-35  02/26/2017 Creatinine1.85, BUN31, Potassium3.7, Sodium130, [id number]  02/25/2017 Creatinine1.85, BUN28, Potassium3.8, Sodium132, [id number]  02/24/2017 Creatinine1.68, BUN23, Potassium3.9, Sodium135, COBT94-99  02/23/2017 Creatinine1.45, BUN17, Potassium3.2, ZDKEUV906, [id number]  02/22/2017 Creatinine1.37, BUN14, Potassium3.6, ATVVLR174, [id number] 12/20/2016 Creatinine 1.2, BUN 21, Potassium 3.6, Sodium 141 09/26/2016 Creatinine 1.29, BUN 27, Potassium 3.7, Sodium 139, EGFR 52.75  05/30/2016 Creatinine 1.24, BUN 19, Potassium 3.8, Sodium 139, EGFR 55.26  Recommendations: NONE - Unable to reach.   Follow-up plan: ICM clinic phone appointment on 08/20/2017.  Office appointment scheduled 07/19/2017 with Dr. Caryl Comes.  Copy of ICM check sent to Dr. Caryl Comes.   3 month ICM trend: 07/05/2017    AT/AF    1 Year ICM trend:       [name], RN 07/05/2017 11:19 AM

## 2017-07-10 ENCOUNTER — Ambulatory Visit (INDEPENDENT_AMBULATORY_CARE_PROVIDER_SITE_OTHER): Payer: Medicare Other

## 2017-07-10 ENCOUNTER — Encounter: Payer: Self-pay | Admitting: Podiatry

## 2017-07-10 ENCOUNTER — Ambulatory Visit: Payer: Medicare Other | Admitting: Podiatry

## 2017-07-10 DIAGNOSIS — R52 Pain, unspecified: Secondary | ICD-10-CM | POA: Diagnosis not present

## 2017-07-10 DIAGNOSIS — Q828 Other specified congenital malformations of skin: Secondary | ICD-10-CM | POA: Diagnosis not present

## 2017-07-10 DIAGNOSIS — M216X2 Other acquired deformities of left foot: Secondary | ICD-10-CM

## 2017-07-10 NOTE — Progress Notes (Addendum)
   Subjective:    Patient ID: Morgan Clay, female    DOB: March 10, 1946, 72 y.o.   MRN: [id number]  HPIthis patient presents to the office with chief complaint of a painful callus under the ball of her left foot.  This callus has been extremely painful when she walks on her left foot.  She says she has trimmed the callus herself  but it always returns.  She presents the office today stating she desires permanent  correction of this painful skin lesion.  Patient is taking eliquiss.    Review of Systems  All other systems reviewed and are negative.      Objective:   Physical Exam General Appearance  Alert, conversant and in no acute stress.  Vascular  Dorsalis pedis and posterior tibial  pulses are palpable  bilaterally.  Capillary return is within normal limits  bilaterally. Temperature is within normal limits  bilaterally.  Neurologic  Senn-Weinstein monofilament wire test within normal limits  bilaterally. Muscle power within normal limits bilaterally.  Nails Normal nails noted with no evidence of infection or drainage.  Orthopedic  No limitations of motion of motion feet .  No crepitus or effusions noted.  Mild HAV 1st MPJ left foot.  Plantarflexed second metatarsal left foot with hammer toe left foot.  Skin  normotropic skin with no porokeratosis noted bilaterally.  No signs of infections or ulcers noted.  Deep porokeratotic skin lesion sub 2 left foot.        Assessment & Plan:  Porokeratosis secondary to HT2 and plantar flexed second metatarsal left foot.   IE  X-rays taken reveal a long first metatarsal left foot. , no dramatic pathology is noted. Debridement of porokeratosis.  Patient desires definitive treatment for the elimination of this porokeratotic lesion .  Patient to be referred to Dr. Paulla Dolly for surgical consultation.   Gardiner Barefoot DPM

## 2017-07-11 ENCOUNTER — Other Ambulatory Visit: Payer: Self-pay | Admitting: Internal Medicine

## 2017-07-11 ENCOUNTER — Encounter: Payer: Self-pay | Admitting: Internal Medicine

## 2017-07-11 NOTE — Telephone Encounter (Signed)
Pt was Morgan Nutting, NP for Dr Caryl Comes on 06/13/17. Wt 68 Kg. Age 73 yrs. And Lab from 06/07/17 SCr was 1.76. Will refill Eliquis 5mg  BID.

## 2017-07-12 ENCOUNTER — Other Ambulatory Visit: Payer: Self-pay | Admitting: Podiatry

## 2017-07-12 DIAGNOSIS — M216X2 Other acquired deformities of left foot: Secondary | ICD-10-CM

## 2017-07-19 ENCOUNTER — Ambulatory Visit: Payer: Medicare Other | Admitting: Podiatry

## 2017-07-19 ENCOUNTER — Ambulatory Visit (INDEPENDENT_AMBULATORY_CARE_PROVIDER_SITE_OTHER): Payer: Medicare Other | Admitting: Internal Medicine

## 2017-07-19 ENCOUNTER — Encounter: Payer: Self-pay | Admitting: Internal Medicine

## 2017-07-19 VITALS — BP 132/68 | HR 89 | Ht 59.0 in | Wt 151.0 lb

## 2017-07-19 DIAGNOSIS — Z79899 Other long term (current) drug therapy: Secondary | ICD-10-CM

## 2017-07-19 DIAGNOSIS — Z95810 Presence of automatic (implantable) cardiac defibrillator: Secondary | ICD-10-CM

## 2017-07-19 DIAGNOSIS — I481 Persistent atrial fibrillation: Secondary | ICD-10-CM | POA: Diagnosis not present

## 2017-07-19 DIAGNOSIS — I5022 Chronic systolic (congestive) heart failure: Secondary | ICD-10-CM | POA: Diagnosis not present

## 2017-07-19 DIAGNOSIS — I428 Other cardiomyopathies: Secondary | ICD-10-CM

## 2017-07-19 DIAGNOSIS — I4819 Other persistent atrial fibrillation: Secondary | ICD-10-CM

## 2017-07-19 NOTE — Progress Notes (Signed)
Patient Care Team: [name], DO as PCP - General (Family Medicine)   HPI  Morgan Clay is a 72 y.o. female Seen in followup for CRT-D implanted for nonischemic cardiomyopathy. This is undertaken 2008 with generator change 2012 and again in May 2017   She has significant improvement from her CRT-D    She has a history of atypical atrial flutter that has been associated with functional deterioration. She underwent cardioversion  8/16 and has been treated with apixaban.     She was seen by Benjaman Pott 2/19 with persistent atrial arrhythmias.  Quite symptomatic with shortness of breath and dizziness    No dizziness or orthopnea.  No chest pain.   She has known treated hypothyroidism.Dr. [name] change her thyroid dose a couple weeks ago based on the TSH below.  Patient denies symptoms of GI intolerance, sun sensitivity, neurological symptoms attributable to amiodarone.     She has chronic kidney disease.  Date Cr K Hgb LFTs TSH  1/19 1.76 4.3 14.7   0.33              DATE TEST    4/15 Echo EF 55-60 %   9/17 Echo  EF 45-50 % LAE (52/3.1/35)  10/18 Echo  EF 45-50% LVH mod     Past Medical History:  Diagnosis Date  . Arthritis   . Asthma    reports mild asthma since childhood - had COPD on dx list from prior PCP  . Atypical atrial flutter ([place]) 09/28/2015  . Chronic systolic congestive heart failure ([place])   . DEPRESSION 12/17/2009   Annotation: PHQ-9 score = 14 done on 12/17/2009 Qualifier: Diagnosis of  By: Hassell Done FNP, Tori Milks    . FIBROIDS, UTERUS 03/05/2008  . Glaucoma   . HEPATITIS C - s/p treatment with Harvoni, seeing hepatology, Dawn Drazek 01/04/2007  . Hyperlipemia 12/06/2012  . HYPERTENSION, BENIGN 04/24/2007  . Hypothyroidism   . LBBB (left bundle branch block)   . Nonischemic cardiomyopathy ([place])   . OBESITY 05/27/2009  . OBSTRUCTIVE SLEEP APNEA 11/14/2007   npsg 2009:  Mild osa with AHI 11/hr. Started cpap 2009, quit using due to lack of response  Home sleep testing 09/2010 with AHI only 7/hr and weight loss advised by pulmonology.  . OSTEOPENIA 09/30/2008  . Personal history of other infectious and parasitic disease    Hepatitis B    Past Surgical History:  Procedure Laterality Date  . appendectomy    . CARDIAC CATHETERIZATION    . CARDIOVERSION N/A 12/14/2014   Procedure: CARDIOVERSION;  Surgeon: Dorothy Spark, MD;  Location: [facility] ENDOSCOPY;  Service: Cardiovascular;  Laterality: N/A;  . CARDIOVERSION N/A 02/26/2017   Procedure: CARDIOVERSION;  Surgeon: Evans Lance, MD;  Location: [place] CV LAB;  Service: Cardiovascular;  Laterality: N/A;  . EP IMPLANTABLE DEVICE N/A 10/06/2015   Procedure: BIV ICD Generator Changeout;  Surgeon: [name], MD;  Location: [place] [facility] LAB;  Service: Cardiovascular;  Laterality: N/A;    Current Outpatient Medications  Medication Sig Dispense Refill  . albuterol (PROVENTIL HFA;VENTOLIN HFA) 108 (90 BASE) MCG/ACT inhaler Inhale 2 puffs into the lungs every 6 (six) hours as needed for wheezing or shortness of breath. 1 Inhaler 3  . amiodarone (PACERONE) 200 MG tablet 2/20-2/26 take 2 tablets, 400mg  twice/day 2/27-3/19- take 1 tablet, 200mg  twice/day March 20- one tablet, 200mg  daily 90 tablet 3  . BIDIL 20-37.5 MG tablet TAKE 1 TABLET BY MOUTH TWICE DAILY  180 tablet 3  . ELIQUIS 5 MG TABS tablet TAKE ONE TABLET BY MOUTH TWICE DAILY 180 tablet 3  . ENTRESTO 97-103 MG TAKE 1 TABLET BY MOUTH TWICE DAILY **TITRATED  DOSE  UP** 60 tablet 10  . fluticasone (FLONASE) 50 MCG/ACT nasal spray Place 2 sprays into both nostrils daily. 16 g 6  . furosemide (LASIX) 40 MG tablet Take 60 mg by mouth daily.    [name] levothyroxine (SYNTHROID, LEVOTHROID) 125 MCG tablet Take 1 tablet (125 mcg total) by mouth daily. 90 tablet 0  . nitroGLYCERIN (NITROSTAT) 0.4 MG SL tablet Place 0.4 mg under the tongue every 5 (five) minutes as needed for chest pain.    [name] sertraline (ZOLOFT) 50 MG tablet Take 1 tablet (50 mg  total) by mouth daily. 30 tablet 3  . spironolactone (ALDACTONE) 25 MG tablet Take 1 tablet (25 mg total) daily by mouth. 90 tablet 3  . traZODone (DESYREL) 50 MG tablet Take 1 tablet (50 mg total) by mouth at bedtime. 90 tablet 3  . carvedilol (COREG) 25 MG tablet Take 1 tablet (25 mg total) by mouth 2 (two) times daily. 180 tablet 3   No current facility-administered medications for this visit.     No Known Allergies  Review of Systems negative except from HPI and PMH  Physical Exam BP 132/68   Pulse 89   Ht 4\' 11"  (1.499 m)   Wt 151 lb (68.5 kg)   SpO2 96%   BMI 30.50 kg/m  Well developed and nourished in no acute distress HENT normal Neck supple with JVP-flat Clear Regular rate and rhythm, no murmurs or gallops Abd-soft with active BS No Clubbing cyanosis edema Skin-warm and dry A & Oriented  Grossly normal sensory and motor function   ECG  Atrial tach CL 300 CHB and Vpacing with Biv configuration   Assessment and  Plan  DiastolicSystolic heart failure Acute/ chronic  Nonischemic cardiomyopathy  High Risk Medication Surveillance  Hypertension    CRT-D- Medtronic  The patient's device was interrogated.  The information was reviewed. No changes were made in the programming.    Diaphragmatic stimulation   Atrial Flutter-atypical & slow  BP is much better controlled  still in atrial flutter/tach  Will undertake DCCV now that she is loaded on amio  And the plan will be to try and determine whether she is benefitted from rhythm control following restoration of sinus.  It is her impression and ours that the answer is yes  Will need to check her Amio labs  Diaphragmatic stimulation infrequent  .

## 2017-07-19 NOTE — H&P (View-Only) (Signed)
Patient Care Team: [name], DO as PCP - General (Family Medicine)   HPI  Morgan Clay is a 72 y.o. female Seen in followup for CRT-D implanted for nonischemic cardiomyopathy. This is undertaken 2008 with generator change 2012 and again in May 2017   She has significant improvement from her CRT-D    She has a history of atypical atrial flutter that has been associated with functional deterioration. She underwent cardioversion  8/16 and has been treated with apixaban.     She was seen by Benjaman Pott 2/19 with persistent atrial arrhythmias.  Quite symptomatic with shortness of breath and dizziness    No dizziness or orthopnea.  No chest pain.   She has known treated hypothyroidism.Dr. [name] change her thyroid dose a couple weeks ago based on the TSH below.  Patient denies symptoms of GI intolerance, sun sensitivity, neurological symptoms attributable to amiodarone.     She has chronic kidney disease.  Date Cr K Hgb LFTs TSH  1/19 1.76 4.3 14.7   0.33              DATE TEST    4/15 Echo EF 55-60 %   9/17 Echo  EF 45-50 % LAE (52/3.1/35)  10/18 Echo  EF 45-50% LVH mod     Past Medical History:  Diagnosis Date  . Arthritis   . Asthma    reports mild asthma since childhood - had COPD on dx list from prior PCP  . Atypical atrial flutter ([place]) 09/28/2015  . Chronic systolic congestive heart failure ([place])   . DEPRESSION 12/17/2009   Annotation: PHQ-9 score = 14 done on 12/17/2009 Qualifier: Diagnosis of  By: Hassell Done FNP, Tori Milks    . FIBROIDS, UTERUS 03/05/2008  . Glaucoma   . HEPATITIS C - s/p treatment with Harvoni, seeing hepatology, Dawn Drazek 01/04/2007  . Hyperlipemia 12/06/2012  . HYPERTENSION, BENIGN 04/24/2007  . Hypothyroidism   . LBBB (left bundle branch block)   . Nonischemic cardiomyopathy ([place])   . OBESITY 05/27/2009  . OBSTRUCTIVE SLEEP APNEA 11/14/2007   npsg 2009:  Mild osa with AHI 11/hr. Started cpap 2009, quit using due to lack of response  Home sleep testing 09/2010 with AHI only 7/hr and weight loss advised by pulmonology.  . OSTEOPENIA 09/30/2008  . Personal history of other infectious and parasitic disease    Hepatitis B    Past Surgical History:  Procedure Laterality Date  . appendectomy    . CARDIAC CATHETERIZATION    . CARDIOVERSION N/A 12/14/2014   Procedure: CARDIOVERSION;  Surgeon: Dorothy Spark, MD;  Location: [facility] ENDOSCOPY;  Service: Cardiovascular;  Laterality: N/A;  . CARDIOVERSION N/A 02/26/2017   Procedure: CARDIOVERSION;  Surgeon: Evans Lance, MD;  Location: [place] CV LAB;  Service: Cardiovascular;  Laterality: N/A;  . EP IMPLANTABLE DEVICE N/A 10/06/2015   Procedure: BIV ICD Generator Changeout;  Surgeon: [name], MD;  Location: [place] CV LAB;  Service: Cardiovascular;  Laterality: N/A;    Current Outpatient Medications  Medication Sig Dispense Refill  . albuterol (PROVENTIL HFA;VENTOLIN HFA) 108 (90 BASE) MCG/ACT inhaler Inhale 2 puffs into the lungs every 6 (six) hours as needed for wheezing or shortness of breath. 1 Inhaler 3  . amiodarone (PACERONE) 200 MG tablet 2/20-2/26 take 2 tablets, 400mg  twice/day 2/27-3/19- take 1 tablet, 200mg  twice/day March 20- one tablet, 200mg  daily 90 tablet 3  . BIDIL 20-37.5 MG tablet TAKE 1 TABLET BY MOUTH TWICE DAILY  180 tablet 3  . ELIQUIS 5 MG TABS tablet TAKE ONE TABLET BY MOUTH TWICE DAILY 180 tablet 3  . ENTRESTO 97-103 MG TAKE 1 TABLET BY MOUTH TWICE DAILY **TITRATED  DOSE  UP** 60 tablet 10  . fluticasone (FLONASE) 50 MCG/ACT nasal spray Place 2 sprays into both nostrils daily. 16 g 6  . furosemide (LASIX) 40 MG tablet Take 60 mg by mouth daily.    [name] levothyroxine (SYNTHROID, LEVOTHROID) 125 MCG tablet Take 1 tablet (125 mcg total) by mouth daily. 90 tablet 0  . nitroGLYCERIN (NITROSTAT) 0.4 MG SL tablet Place 0.4 mg under the tongue every 5 (five) minutes as needed for chest pain.    [name] sertraline (ZOLOFT) 50 MG tablet Take 1 tablet (50 mg  total) by mouth daily. 30 tablet 3  . spironolactone (ALDACTONE) 25 MG tablet Take 1 tablet (25 mg total) daily by mouth. 90 tablet 3  . traZODone (DESYREL) 50 MG tablet Take 1 tablet (50 mg total) by mouth at bedtime. 90 tablet 3  . carvedilol (COREG) 25 MG tablet Take 1 tablet (25 mg total) by mouth 2 (two) times daily. 180 tablet 3   No current facility-administered medications for this visit.     No Known Allergies  Review of Systems negative except from HPI and PMH  Physical Exam BP 132/68   Pulse 89   Ht 4\' 11"  (1.499 m)   Wt 151 lb (68.5 kg)   SpO2 96%   BMI 30.50 kg/m  Well developed and nourished in no acute distress HENT normal Neck supple with JVP-flat Clear Regular rate and rhythm, no murmurs or gallops Abd-soft with active BS No Clubbing cyanosis edema Skin-warm and dry A & Oriented  Grossly normal sensory and motor function   ECG  Atrial tach CL 300 CHB and Vpacing with Biv configuration   Assessment and  Plan  DiastolicSystolic heart failure Acute/ chronic  Nonischemic cardiomyopathy  High Risk Medication Surveillance  Hypertension    CRT-D- Medtronic  The patient's device was interrogated.  The information was reviewed. No changes were made in the programming.    Diaphragmatic stimulation   Atrial Flutter-atypical & slow  BP is much better controlled  still in atrial flutter/tach  Will undertake DCCV now that she is loaded on amio  And the plan will be to try and determine whether she is benefitted from rhythm control following restoration of sinus.  It is her impression and ours that the answer is yes  Will need to check her Amio labs  Diaphragmatic stimulation infrequent  .

## 2017-07-19 NOTE — Patient Instructions (Addendum)
Medication Instructions:  Your physician recommends that you continue on your current medications as directed. Please refer to the Current Medication list given to you today.  Labwork:  CMET, CBC, TSH  Testing/Procedures: Your physician has recommended that you have a Cardioversion (DCCV). Electrical Cardioversion uses a jolt of electricity to your heart either through paddles or wired patches attached to your chest. This is a controlled, usually prescheduled, procedure. Defibrillation is done under light anesthesia in the hospital, and you usually go home the day of the procedure. This is done to get your heart back into a normal rhythm. You are not awake for the procedure. Please see the instruction sheet given to you today.  Please arrive at the [facility] main entrance of [place] [facility] at:   Tuesday March 19th at 11:00am  Do not eat or drink after midnight prior to procedure Hold Aldactone and lasix the morning of the procedure You will need someone to drive you home at discharge   Follow-Up: Your physician recommends that you schedule a follow-up appointment in:   3 weeks after your cardioversion with Roderic Palau, NP at the Afib clinic.  6 months with Chanetta Marshall, PA   Any Other Special Instructions Will Be Listed Below (If Applicable).     If you need a refill on your cardiac medications before your next appointment, please call your pharmacy.

## 2017-07-20 ENCOUNTER — Telehealth: Payer: Self-pay

## 2017-07-20 DIAGNOSIS — Z79899 Other long term (current) drug therapy: Secondary | ICD-10-CM

## 2017-07-20 LAB — CUP PACEART INCLINIC DEVICE CHECK
Battery Voltage: 2.97 V
Brady Statistic AP VS Percent: 0 %
Brady Statistic AS VP Percent: 92.71 %
Brady Statistic AS VS Percent: 0.64 %
Brady Statistic RA Percent Paced: 6.33 %
Date Time Interrogation Session: 20190314181208
HighPow Impedance: 44 Ohm
HighPow Impedance: 73 Ohm
Implantable Lead Implant Date: 20080818
Implantable Lead Location: 753858
Implantable Lead Location: 753860
Lead Channel Impedance Value: 380 Ohm
Lead Channel Impedance Value: 399 Ohm
Lead Channel Pacing Threshold Amplitude: 1.25 V
Lead Channel Pacing Threshold Pulse Width: 0.4 ms
Lead Channel Pacing Threshold Pulse Width: 0.4 ms
Lead Channel Sensing Intrinsic Amplitude: 3.625 mV
Lead Channel Setting Pacing Amplitude: 2.5 V
Lead Channel Setting Pacing Pulse Width: 0.4 ms
Lead Channel Setting Sensing Sensitivity: 0.3 mV
MDC IDC LEAD IMPLANT DT: 20080818
MDC IDC LEAD IMPLANT DT: 20080818
MDC IDC LEAD LOCATION: 753859
MDC IDC MSMT BATTERY REMAINING LONGEVITY: 73 mo
MDC IDC MSMT LEADCHNL LV IMPEDANCE VALUE: 4047 Ohm
MDC IDC MSMT LEADCHNL LV IMPEDANCE VALUE: 4047 Ohm
MDC IDC MSMT LEADCHNL RA IMPEDANCE VALUE: 437 Ohm
MDC IDC MSMT LEADCHNL RV IMPEDANCE VALUE: 304 Ohm
MDC IDC MSMT LEADCHNL RV PACING THRESHOLD AMPLITUDE: 1 V
MDC IDC PG IMPLANT DT: 20170531
MDC IDC SET LEADCHNL LV PACING AMPLITUDE: 1.75 V
MDC IDC SET LEADCHNL LV PACING PULSEWIDTH: 0.4 ms
MDC IDC SET LEADCHNL RA PACING AMPLITUDE: 1.5 V
MDC IDC STAT BRADY AP VP PERCENT: 6.65 %
MDC IDC STAT BRADY RV PERCENT PACED: 99.28 %

## 2017-07-20 LAB — COMPREHENSIVE METABOLIC PANEL
A/G RATIO: 1.4 (ref 1.2–2.2)
ALK PHOS: 68 IU/L (ref 39–117)
ALT: 18 IU/L (ref 0–32)
AST: 22 IU/L (ref 0–40)
Albumin: 4.9 g/dL — ABNORMAL HIGH (ref 3.6–4.8)
BUN/Creatinine Ratio: 19 (ref 12–28)
BUN: 38 mg/dL — ABNORMAL HIGH (ref 8–27)
Bilirubin Total: 0.3 mg/dL (ref 0.0–1.2)
CALCIUM: 9.8 mg/dL (ref 8.7–10.3)
CHLORIDE: 104 mmol/L (ref 96–106)
CO2: 20 mmol/L (ref 20–29)
Creatinine, Ser: 1.96 mg/dL — ABNORMAL HIGH (ref 0.57–1.00)
GFR calc Af Amer: 29 mL/min/{1.73_m2} — ABNORMAL LOW (ref >59)
GFR calc non Af Amer: 26 mL/min/{1.73_m2} — ABNORMAL LOW (ref >59)
Globulin, Total: 3.4 g/dL (ref 1.5–4.5)
Glucose: 87 mg/dL (ref 65–99)
POTASSIUM: 4.9 mmol/L (ref 3.5–5.2)
Sodium: 140 mmol/L (ref 134–144)
Total Protein: 8.3 g/dL (ref 6.0–8.5)

## 2017-07-20 LAB — CBC WITH DIFFERENTIAL/PLATELET
BASOS ABS: 0 10*3/uL (ref 0.0–0.2)
Basos: 0 %
EOS (ABSOLUTE): 0.2 10*3/uL (ref 0.0–0.4)
Eos: 3 %
Hematocrit: 42 % (ref 34.0–46.6)
Hemoglobin: 13.9 g/dL (ref 11.1–15.9)
IMMATURE GRANS (ABS): 0 10*3/uL (ref 0.0–0.1)
IMMATURE GRANULOCYTES: 0 %
LYMPHS: 32 %
Lymphocytes Absolute: 1.9 10*3/uL (ref 0.7–3.1)
MCH: 29.3 pg (ref 26.6–33.0)
MCHC: 33.1 g/dL (ref 31.5–35.7)
MCV: 89 fL (ref 79–97)
Monocytes Absolute: 0.5 10*3/uL (ref 0.1–0.9)
Monocytes: 9 %
Neutrophils Absolute: 3.3 10*3/uL (ref 1.4–7.0)
Neutrophils: 56 %
PLATELETS: 193 10*3/uL (ref 150–379)
RBC: 4.74 x10E6/uL (ref 3.77–5.28)
RDW: 15.9 % — AB (ref 12.3–15.4)
WBC: 5.9 10*3/uL (ref 3.4–10.8)

## 2017-07-20 LAB — TSH: TSH: 1.22 u[IU]/mL (ref 0.450–4.500)

## 2017-07-20 NOTE — Telephone Encounter (Signed)
-----   Message from [name], MD sent at 07/20/2017  8:06 AM EDT ----- Please Inform Patient that labs are normal x modest worsening in renal function   We should repeat in about 10 days Thanks

## 2017-07-20 NOTE — Telephone Encounter (Signed)
Reviewed lab results with pt, she understands and states she will have repeat labs drawn on 4/1 at the [facility] location.

## 2017-07-24 ENCOUNTER — Ambulatory Visit (HOSPITAL_COMMUNITY): Payer: Medicare Other | Admitting: Anesthesiology

## 2017-07-24 ENCOUNTER — Ambulatory Visit (HOSPITAL_COMMUNITY)
Admission: RE | Admit: 2017-07-24 | Discharge: 2017-07-24 | Disposition: A | Discharge status: Home / Self Care | Payer: Medicare Other | Source: Ambulatory Visit | Attending: Cardiovascular Disease | Admitting: Cardiovascular Disease

## 2017-07-24 ENCOUNTER — Encounter (HOSPITAL_COMMUNITY)
Admission: RE | Disposition: A | Discharge status: Home / Self Care | Payer: Self-pay | Source: Ambulatory Visit | Attending: Cardiovascular Disease

## 2017-07-24 ENCOUNTER — Encounter (HOSPITAL_COMMUNITY): Payer: Self-pay | Admitting: Anesthesiology

## 2017-07-24 ENCOUNTER — Other Ambulatory Visit: Payer: Self-pay

## 2017-07-24 DIAGNOSIS — I110 Hypertensive heart disease with heart failure: Secondary | ICD-10-CM | POA: Diagnosis not present

## 2017-07-24 DIAGNOSIS — F329 Major depressive disorder, single episode, unspecified: Secondary | ICD-10-CM | POA: Diagnosis not present

## 2017-07-24 DIAGNOSIS — I484 Atypical atrial flutter: Secondary | ICD-10-CM | POA: Insufficient documentation

## 2017-07-24 DIAGNOSIS — G4733 Obstructive sleep apnea (adult) (pediatric): Secondary | ICD-10-CM | POA: Diagnosis not present

## 2017-07-24 DIAGNOSIS — E669 Obesity, unspecified: Secondary | ICD-10-CM | POA: Diagnosis not present

## 2017-07-24 DIAGNOSIS — E039 Hypothyroidism, unspecified: Secondary | ICD-10-CM | POA: Diagnosis not present

## 2017-07-24 DIAGNOSIS — Z6830 Body mass index (BMI) 30.0-30.9, adult: Secondary | ICD-10-CM | POA: Diagnosis not present

## 2017-07-24 DIAGNOSIS — I481 Persistent atrial fibrillation: Secondary | ICD-10-CM | POA: Diagnosis not present

## 2017-07-24 DIAGNOSIS — I428 Other cardiomyopathies: Secondary | ICD-10-CM | POA: Diagnosis not present

## 2017-07-24 DIAGNOSIS — Z79890 Hormone replacement therapy (postmenopausal): Secondary | ICD-10-CM | POA: Diagnosis not present

## 2017-07-24 DIAGNOSIS — Z79899 Other long term (current) drug therapy: Secondary | ICD-10-CM | POA: Diagnosis not present

## 2017-07-24 DIAGNOSIS — I498 Other specified cardiac arrhythmias: Secondary | ICD-10-CM | POA: Diagnosis not present

## 2017-07-24 DIAGNOSIS — Z7901 Long term (current) use of anticoagulants: Secondary | ICD-10-CM | POA: Diagnosis not present

## 2017-07-24 DIAGNOSIS — I4891 Unspecified atrial fibrillation: Secondary | ICD-10-CM | POA: Diagnosis not present

## 2017-07-24 DIAGNOSIS — B1920 Unspecified viral hepatitis C without hepatic coma: Secondary | ICD-10-CM | POA: Diagnosis not present

## 2017-07-24 DIAGNOSIS — J45909 Unspecified asthma, uncomplicated: Secondary | ICD-10-CM | POA: Diagnosis not present

## 2017-07-24 DIAGNOSIS — Z7951 Long term (current) use of inhaled steroids: Secondary | ICD-10-CM | POA: Insufficient documentation

## 2017-07-24 DIAGNOSIS — I5022 Chronic systolic (congestive) heart failure: Secondary | ICD-10-CM | POA: Diagnosis not present

## 2017-07-24 DIAGNOSIS — I4819 Other persistent atrial fibrillation: Secondary | ICD-10-CM

## 2017-07-24 HISTORY — PX: CARDIOVERSION: SHX1299

## 2017-07-24 SURGERY — CARDIOVERSION
Anesthesia: General

## 2017-07-24 MED ORDER — LIDOCAINE 2% (20 MG/ML) 5 ML SYRINGE
INTRAMUSCULAR | Status: DC | PRN
  Administered 2017-07-24: 40 mg via INTRAVENOUS

## 2017-07-24 MED ORDER — PROPOFOL 10 MG/ML IV BOLUS
INTRAVENOUS | Status: DC | PRN
  Administered 2017-07-24: 50 mg via INTRAVENOUS

## 2017-07-24 MED ORDER — SODIUM CHLORIDE 0.9 % IV SOLN
INTRAVENOUS | Status: DC
  Administered 2017-07-24: 12:00:00 via INTRAVENOUS

## 2017-07-24 MED ORDER — PROPOFOL 10 MG/ML IV BOLUS
INTRAVENOUS | Status: AC
  Filled 2017-07-24: qty 40

## 2017-07-24 NOTE — Transfer of Care (Signed)
Immediate Anesthesia Transfer of Care Note  Patient: Morgan Clay  Procedure(s) Performed: CARDIOVERSION (N/A )  Patient Location: Endoscopy Unit  Anesthesia Type:General  Level of Consciousness: drowsy and patient cooperative  Airway & Oxygen Therapy: Patient Spontanous Breathing  Post-op Assessment: Report given to RN, Post -op Vital signs reviewed and stable and Patient moving all extremities X 4  Post vital signs: Reviewed and stable  Last Vitals:  Vitals:   07/24/17 1113  BP: (!) 158/65  Pulse: 81  Resp: 20  Temp: 36.4 C  SpO2: 99%    Last Pain:  Vitals:   07/24/17 1113  TempSrc: Oral         Complications: No apparent anesthesia complications

## 2017-07-24 NOTE — Anesthesia Preprocedure Evaluation (Addendum)
Anesthesia Evaluation  Patient identified by MRN, date of birth, ID band Patient awake    Reviewed: Allergy & Precautions, NPO status , Patient's Chart, lab work & pertinent test results, reviewed documented beta blocker date and time   Airway Mallampati: II  TM Distance: >3 FB Neck ROM: Full    Dental  (+) Lower Dentures, Partial Upper, Caps   Pulmonary asthma , sleep apnea and Continuous Positive Airway Pressure Ventilation , COPD,  COPD inhaler,    Pulmonary exam normal breath sounds clear to auscultation + decreased breath sounds      Cardiovascular hypertension, Pt. on medications and Pt. on home beta blockers +CHF  Normal cardiovascular exam+ dysrhythmias Atrial Fibrillation + Cardiac Defibrillator  Rhythm:Regular Rate:Normal  Non ischemic CM LVEF   Neuro/Psych PSYCHIATRIC DISORDERS Depression negative neurological ROS     GI/Hepatic negative GI ROS, Neg liver ROS,   Endo/Other  diabetes, Well Controlled, Type 2Hypothyroidism Obesity  Renal/GU Renal InsufficiencyRenal disease  negative genitourinary   Musculoskeletal negative musculoskeletal ROS (+) Arthritis , Osteoarthritis,    Abdominal (+) + obese,   Peds  Hematology Eliquis- last dose   Anesthesia Other Findings   Reproductive/Obstetrics                           Anesthesia Physical Anesthesia Plan  ASA: III  Anesthesia Plan: General   Post-op Pain Management:    Induction: Intravenous  PONV Risk Score and Plan: 3 and Treatment may vary due to age or medical condition, Propofol infusion and Ondansetron  Airway Management Planned: Natural Airway  Additional Equipment:   Intra-op Plan:   Post-operative Plan:   Informed Consent: I have reviewed the patients History and Physical, chart, labs and discussed the procedure including the risks, benefits and alternatives for the proposed anesthesia with the patient or  authorized representative who has indicated his/her understanding and acceptance.     Plan Discussed with: Anesthesiologist, CRNA and Surgeon  Anesthesia Plan Comments:         Anesthesia Quick Evaluation

## 2017-07-24 NOTE — Interval H&P Note (Signed)
History and Physical Interval Note:  07/24/2017 11:59 AM  Morgan Clay  has presented today for surgery, with the diagnosis of afib  The various methods of treatment have been discussed with the patient and family. After consideration of risks, benefits and other options for treatment, the patient has consented to  Procedure(s): CARDIOVERSION (N/A) as a surgical intervention .  The patient's history has been reviewed, patient examined, no change in status, stable for surgery.  I have reviewed the patient's chart and labs.  Questions were answered to the patient's satisfaction.     Mertie Moores

## 2017-07-24 NOTE — CV Procedure (Signed)
    Cardioversion Note  Morgan Clay [id number] [birth year]  Procedure: DC Cardioversion Indications: atrial fib   Procedure Details Consent: Obtained Time Out: Verified patient identification, verified procedure, site/side was marked, verified correct patient position, special equipment/implants available, Radiology Safety Procedures followed,  medications/allergies/relevent history reviewed, required imaging and test results available.  Performed  The patient has been on adequate anticoagulation.  The patient received IV Lidocaine 40 mg IV followed by Propofol 50 mg IV  for sedation.  Synchronous cardioversion was performed at 120  joules.  The cardioversion was successful     Complications: No apparent complications Patient did tolerate procedure well.   Thayer Headings, Brooke Bonito., MD, [facility] 07/24/2017, 12:13 PM

## 2017-07-24 NOTE — Anesthesia Postprocedure Evaluation (Signed)
Anesthesia Post Note  Patient: Royann Wildasin  Procedure(s) Performed: CARDIOVERSION (N/A )     Patient location during evaluation: PACU Anesthesia Type: General Level of consciousness: awake and alert and oriented Pain management: pain level controlled Vital Signs Assessment: post-procedure vital signs reviewed and stable Respiratory status: spontaneous breathing, nonlabored ventilation and respiratory function stable Cardiovascular status: blood pressure returned to baseline and stable Postop Assessment: no apparent nausea or vomiting Anesthetic complications: no    Last Vitals:  Vitals:   07/24/17 1232 07/24/17 1240  BP: (!) 113/53 (!) 137/55  Pulse: 60 (!) 59  Resp: 16 (!) 23  Temp:    SpO2: 100% 100%    Last Pain:  Vitals:   07/24/17 1212  TempSrc: Oral                 Daaiyah Baumert A.

## 2017-07-24 NOTE — Discharge Instructions (Signed)
Electrical Cardioversion, Care After °This sheet gives you information about how to care for yourself after your procedure. Your health care provider may also give you more specific instructions. If you have problems or questions, contact your health care provider. °What can I expect after the procedure? °After the procedure, it is common to have: °· Some redness on the skin where the shocks were given. ° °Follow these instructions at home: °· Do not drive for 24 hours if you were given a medicine to help you relax (sedative). °· Take over-the-counter and prescription medicines only as told by your health care provider. °· Ask your health care provider how to check your pulse. Check it often. °· Rest for 48 hours after the procedure or as told by your health care provider. °· Avoid or limit your caffeine use as told by your health care provider. °Contact a health care provider if: °· You feel like your heart is beating too quickly or your pulse is not regular. °· You have a serious muscle cramp that does not go away. °Get help right away if: °· You have discomfort in your chest. °· You are dizzy or you feel faint. °· You have trouble breathing or you are short of breath. °· Your speech is slurred. °· You have trouble moving an arm or leg on one side of your body. °· Your fingers or toes turn cold or blue. °This information is not intended to replace advice given to you by your health care provider. Make sure you discuss any questions you have with your health care provider. °Document Released: 02/12/2013 Document Revised: 11/26/2015 Document Reviewed: 10/29/2015 °Elsevier Interactive Patient Education © 2018 [employer] ° °

## 2017-07-25 ENCOUNTER — Encounter (HOSPITAL_COMMUNITY): Payer: Self-pay | Admitting: Cardiovascular Disease

## 2017-07-31 ENCOUNTER — Emergency Department (HOSPITAL_COMMUNITY): Payer: Medicare Other

## 2017-07-31 ENCOUNTER — Inpatient Hospital Stay (HOSPITAL_COMMUNITY)
Admission: EM | Admit: 2017-07-31 | Discharge: 2017-08-17 | DRG: 391 | Disposition: A | Discharge status: Skilled Nursing Facility | Payer: Medicare Other | Attending: Internal Medicine | Admitting: Internal Medicine

## 2017-07-31 ENCOUNTER — Other Ambulatory Visit: Payer: Self-pay

## 2017-07-31 ENCOUNTER — Encounter (HOSPITAL_COMMUNITY): Payer: Self-pay | Admitting: Nurse Practitioner

## 2017-07-31 DIAGNOSIS — J440 Chronic obstructive pulmonary disease with acute lower respiratory infection: Secondary | ICD-10-CM | POA: Diagnosis present

## 2017-07-31 DIAGNOSIS — E872 Acidosis: Secondary | ICD-10-CM | POA: Diagnosis not present

## 2017-07-31 DIAGNOSIS — K5720 Diverticulitis of large intestine with perforation and abscess without bleeding: Secondary | ICD-10-CM | POA: Diagnosis not present

## 2017-07-31 DIAGNOSIS — R0602 Shortness of breath: Secondary | ICD-10-CM | POA: Diagnosis not present

## 2017-07-31 DIAGNOSIS — I152 Hypertension secondary to endocrine disorders: Secondary | ICD-10-CM | POA: Diagnosis present

## 2017-07-31 DIAGNOSIS — R140 Abdominal distension (gaseous): Secondary | ICD-10-CM | POA: Diagnosis not present

## 2017-07-31 DIAGNOSIS — K828 Other specified diseases of gallbladder: Secondary | ICD-10-CM | POA: Diagnosis not present

## 2017-07-31 DIAGNOSIS — D6489 Other specified anemias: Secondary | ICD-10-CM | POA: Diagnosis not present

## 2017-07-31 DIAGNOSIS — Z7901 Long term (current) use of anticoagulants: Secondary | ICD-10-CM | POA: Diagnosis not present

## 2017-07-31 DIAGNOSIS — K5732 Diverticulitis of large intestine without perforation or abscess without bleeding: Secondary | ICD-10-CM | POA: Diagnosis not present

## 2017-07-31 DIAGNOSIS — I10 Essential (primary) hypertension: Secondary | ICD-10-CM | POA: Diagnosis not present

## 2017-07-31 DIAGNOSIS — E860 Dehydration: Secondary | ICD-10-CM | POA: Diagnosis not present

## 2017-07-31 DIAGNOSIS — E1159 Type 2 diabetes mellitus with other circulatory complications: Secondary | ICD-10-CM | POA: Diagnosis present

## 2017-07-31 DIAGNOSIS — J810 Acute pulmonary edema: Secondary | ICD-10-CM | POA: Diagnosis present

## 2017-07-31 DIAGNOSIS — I5043 Acute on chronic combined systolic (congestive) and diastolic (congestive) heart failure: Secondary | ICD-10-CM | POA: Diagnosis not present

## 2017-07-31 DIAGNOSIS — E039 Hypothyroidism, unspecified: Secondary | ICD-10-CM | POA: Diagnosis present

## 2017-07-31 DIAGNOSIS — E8770 Fluid overload, unspecified: Secondary | ICD-10-CM | POA: Diagnosis not present

## 2017-07-31 DIAGNOSIS — G4733 Obstructive sleep apnea (adult) (pediatric): Secondary | ICD-10-CM | POA: Diagnosis not present

## 2017-07-31 DIAGNOSIS — K5792 Diverticulitis of intestine, part unspecified, without perforation or abscess without bleeding: Secondary | ICD-10-CM

## 2017-07-31 DIAGNOSIS — M79602 Pain in left arm: Secondary | ICD-10-CM | POA: Diagnosis not present

## 2017-07-31 DIAGNOSIS — I482 Chronic atrial fibrillation: Secondary | ICD-10-CM | POA: Diagnosis present

## 2017-07-31 DIAGNOSIS — J189 Pneumonia, unspecified organism: Secondary | ICD-10-CM | POA: Diagnosis present

## 2017-07-31 DIAGNOSIS — I5033 Acute on chronic diastolic (congestive) heart failure: Secondary | ICD-10-CM | POA: Diagnosis not present

## 2017-07-31 DIAGNOSIS — R0789 Other chest pain: Secondary | ICD-10-CM | POA: Diagnosis not present

## 2017-07-31 DIAGNOSIS — E1129 Type 2 diabetes mellitus with other diabetic kidney complication: Secondary | ICD-10-CM | POA: Diagnosis not present

## 2017-07-31 DIAGNOSIS — N179 Acute kidney failure, unspecified: Secondary | ICD-10-CM | POA: Diagnosis not present

## 2017-07-31 DIAGNOSIS — K56600 Partial intestinal obstruction, unspecified as to cause: Secondary | ICD-10-CM

## 2017-07-31 DIAGNOSIS — R55 Syncope and collapse: Secondary | ICD-10-CM

## 2017-07-31 DIAGNOSIS — Z9851 Tubal ligation status: Secondary | ICD-10-CM

## 2017-07-31 DIAGNOSIS — K529 Noninfective gastroenteritis and colitis, unspecified: Secondary | ICD-10-CM

## 2017-07-31 DIAGNOSIS — E1169 Type 2 diabetes mellitus with other specified complication: Secondary | ICD-10-CM | POA: Diagnosis present

## 2017-07-31 DIAGNOSIS — J9601 Acute respiratory failure with hypoxia: Secondary | ICD-10-CM | POA: Diagnosis not present

## 2017-07-31 DIAGNOSIS — R059 Cough, unspecified: Secondary | ICD-10-CM

## 2017-07-31 DIAGNOSIS — I484 Atypical atrial flutter: Secondary | ICD-10-CM | POA: Diagnosis not present

## 2017-07-31 DIAGNOSIS — R0600 Dyspnea, unspecified: Secondary | ICD-10-CM

## 2017-07-31 DIAGNOSIS — N170 Acute kidney failure with tubular necrosis: Secondary | ICD-10-CM | POA: Diagnosis not present

## 2017-07-31 DIAGNOSIS — I480 Paroxysmal atrial fibrillation: Secondary | ICD-10-CM | POA: Diagnosis present

## 2017-07-31 DIAGNOSIS — I130 Hypertensive heart and chronic kidney disease with heart failure and stage 1 through stage 4 chronic kidney disease, or unspecified chronic kidney disease: Secondary | ICD-10-CM | POA: Diagnosis not present

## 2017-07-31 DIAGNOSIS — E669 Obesity, unspecified: Secondary | ICD-10-CM | POA: Diagnosis present

## 2017-07-31 DIAGNOSIS — Z825 Family history of asthma and other chronic lower respiratory diseases: Secondary | ICD-10-CM

## 2017-07-31 DIAGNOSIS — Z79890 Hormone replacement therapy (postmenopausal): Secondary | ICD-10-CM

## 2017-07-31 DIAGNOSIS — M6281 Muscle weakness (generalized): Secondary | ICD-10-CM | POA: Diagnosis not present

## 2017-07-31 DIAGNOSIS — J449 Chronic obstructive pulmonary disease, unspecified: Secondary | ICD-10-CM | POA: Diagnosis present

## 2017-07-31 DIAGNOSIS — R262 Difficulty in walking, not elsewhere classified: Secondary | ICD-10-CM | POA: Diagnosis not present

## 2017-07-31 DIAGNOSIS — I340 Nonrheumatic mitral (valve) insufficiency: Secondary | ICD-10-CM | POA: Diagnosis not present

## 2017-07-31 DIAGNOSIS — I248 Other forms of acute ischemic heart disease: Secondary | ICD-10-CM | POA: Diagnosis present

## 2017-07-31 DIAGNOSIS — N184 Chronic kidney disease, stage 4 (severe): Secondary | ICD-10-CM | POA: Diagnosis present

## 2017-07-31 DIAGNOSIS — I509 Heart failure, unspecified: Secondary | ICD-10-CM | POA: Diagnosis not present

## 2017-07-31 DIAGNOSIS — I428 Other cardiomyopathies: Secondary | ICD-10-CM | POA: Diagnosis present

## 2017-07-31 DIAGNOSIS — I481 Persistent atrial fibrillation: Secondary | ICD-10-CM | POA: Diagnosis present

## 2017-07-31 DIAGNOSIS — Z8619 Personal history of other infectious and parasitic diseases: Secondary | ICD-10-CM

## 2017-07-31 DIAGNOSIS — E1122 Type 2 diabetes mellitus with diabetic chronic kidney disease: Secondary | ICD-10-CM | POA: Diagnosis present

## 2017-07-31 DIAGNOSIS — R079 Chest pain, unspecified: Secondary | ICD-10-CM | POA: Diagnosis not present

## 2017-07-31 DIAGNOSIS — R1111 Vomiting without nausea: Secondary | ICD-10-CM | POA: Diagnosis not present

## 2017-07-31 DIAGNOSIS — R609 Edema, unspecified: Secondary | ICD-10-CM

## 2017-07-31 DIAGNOSIS — B1920 Unspecified viral hepatitis C without hepatic coma: Secondary | ICD-10-CM | POA: Diagnosis present

## 2017-07-31 DIAGNOSIS — Z6831 Body mass index (BMI) 31.0-31.9, adult: Secondary | ICD-10-CM

## 2017-07-31 DIAGNOSIS — Z09 Encounter for follow-up examination after completed treatment for conditions other than malignant neoplasm: Secondary | ICD-10-CM

## 2017-07-31 DIAGNOSIS — Z801 Family history of malignant neoplasm of trachea, bronchus and lung: Secondary | ICD-10-CM

## 2017-07-31 DIAGNOSIS — R05 Cough: Secondary | ICD-10-CM

## 2017-07-31 DIAGNOSIS — I420 Dilated cardiomyopathy: Secondary | ICD-10-CM | POA: Diagnosis not present

## 2017-07-31 DIAGNOSIS — M7989 Other specified soft tissue disorders: Secondary | ICD-10-CM

## 2017-07-31 DIAGNOSIS — N183 Chronic kidney disease, stage 3 (moderate): Secondary | ICD-10-CM | POA: Diagnosis not present

## 2017-07-31 DIAGNOSIS — R100 Acute abdomen: Secondary | ICD-10-CM | POA: Diagnosis not present

## 2017-07-31 DIAGNOSIS — I959 Hypotension, unspecified: Secondary | ICD-10-CM | POA: Diagnosis present

## 2017-07-31 DIAGNOSIS — R1314 Dysphagia, pharyngoesophageal phase: Secondary | ICD-10-CM | POA: Diagnosis not present

## 2017-07-31 DIAGNOSIS — J181 Lobar pneumonia, unspecified organism: Secondary | ICD-10-CM

## 2017-07-31 DIAGNOSIS — Z8249 Family history of ischemic heart disease and other diseases of the circulatory system: Secondary | ICD-10-CM

## 2017-07-31 DIAGNOSIS — I351 Nonrheumatic aortic (valve) insufficiency: Secondary | ICD-10-CM | POA: Diagnosis not present

## 2017-07-31 DIAGNOSIS — D259 Leiomyoma of uterus, unspecified: Secondary | ICD-10-CM | POA: Diagnosis present

## 2017-07-31 DIAGNOSIS — Z950 Presence of cardiac pacemaker: Secondary | ICD-10-CM

## 2017-07-31 DIAGNOSIS — F329 Major depressive disorder, single episode, unspecified: Secondary | ICD-10-CM | POA: Diagnosis present

## 2017-07-31 DIAGNOSIS — Z9049 Acquired absence of other specified parts of digestive tract: Secondary | ICD-10-CM

## 2017-07-31 DIAGNOSIS — R600 Localized edema: Secondary | ICD-10-CM | POA: Diagnosis not present

## 2017-07-31 DIAGNOSIS — R1310 Dysphagia, unspecified: Secondary | ICD-10-CM | POA: Diagnosis not present

## 2017-07-31 DIAGNOSIS — E038 Other specified hypothyroidism: Secondary | ICD-10-CM | POA: Diagnosis not present

## 2017-07-31 HISTORY — DX: Diverticulitis of large intestine with perforation and abscess without bleeding: K57.20

## 2017-07-31 LAB — HEPATIC FUNCTION PANEL
ALT: 13 U/L — AB (ref 14–54)
AST: 16 U/L (ref 15–41)
Albumin: 4.6 g/dL (ref 3.5–5.0)
Alkaline Phosphatase: 69 U/L (ref 38–126)
BILIRUBIN DIRECT: 0.2 mg/dL (ref 0.1–0.5)
BILIRUBIN INDIRECT: 0.8 mg/dL (ref 0.3–0.9)
TOTAL PROTEIN: 9.3 g/dL — AB (ref 6.5–8.1)
Total Bilirubin: 1 mg/dL (ref 0.3–1.2)

## 2017-07-31 LAB — GLUCOSE, CAPILLARY: GLUCOSE-CAPILLARY: 121 mg/dL — AB (ref 65–99)

## 2017-07-31 LAB — BASIC METABOLIC PANEL
ANION GAP: 12 (ref 5–15)
BUN: 54 mg/dL — ABNORMAL HIGH (ref 6–20)
CO2: 22 mmol/L (ref 22–32)
Calcium: 9.6 mg/dL (ref 8.9–10.3)
Chloride: 102 mmol/L (ref 101–111)
Creatinine, Ser: 2.19 mg/dL — ABNORMAL HIGH (ref 0.44–1.00)
GFR calc Af Amer: 25 mL/min — ABNORMAL LOW (ref >60)
GFR, EST NON AFRICAN AMERICAN: 22 mL/min — AB (ref >60)
Glucose, Bld: 140 mg/dL — ABNORMAL HIGH (ref 65–99)
POTASSIUM: 4.4 mmol/L (ref 3.5–5.1)
SODIUM: 136 mmol/L (ref 135–145)

## 2017-07-31 LAB — CBC
HEMATOCRIT: 43.9 % (ref 36.0–46.0)
HEMOGLOBIN: 14.8 g/dL (ref 12.0–15.0)
MCH: 29.8 pg (ref 26.0–34.0)
MCHC: 33.7 g/dL (ref 30.0–36.0)
MCV: 88.5 fL (ref 78.0–100.0)
Platelets: 188 10*3/uL (ref 150–400)
RBC: 4.96 MIL/uL (ref 3.87–5.11)
RDW: 15.4 % (ref 11.5–15.5)
WBC: 11.9 10*3/uL — AB (ref 4.0–10.5)

## 2017-07-31 LAB — I-STAT CG4 LACTIC ACID, ED: Lactic Acid, Venous: 0.9 mmol/L (ref 0.5–1.9)

## 2017-07-31 LAB — PHOSPHORUS: Phosphorus: 3.3 mg/dL (ref 2.5–4.6)

## 2017-07-31 LAB — PROTIME-INR
INR: 1.37
Prothrombin Time: 16.8 seconds — ABNORMAL HIGH (ref 11.4–15.2)

## 2017-07-31 LAB — TROPONIN I
TROPONIN I: 0.04 ng/mL — AB (ref <0.03)
Troponin I: 0.05 ng/mL (ref <0.03)

## 2017-07-31 LAB — MAGNESIUM: MAGNESIUM: 2 mg/dL (ref 1.7–2.4)

## 2017-07-31 LAB — BRAIN NATRIURETIC PEPTIDE: B NATRIURETIC PEPTIDE 5: 547.9 pg/mL — AB (ref 0.0–100.0)

## 2017-07-31 LAB — LIPASE, BLOOD: LIPASE: 25 U/L (ref 11–51)

## 2017-07-31 MED ORDER — OXYCODONE HCL 5 MG PO TABS
5.0000 mg | ORAL_TABLET | ORAL | Status: DC | PRN
  Administered 2017-08-01 – 2017-08-12 (×8): 5 mg via ORAL
  Filled 2017-07-31 (×9): qty 1

## 2017-07-31 MED ORDER — MORPHINE SULFATE (PF) 4 MG/ML IV SOLN
6.0000 mg | Freq: Once | INTRAVENOUS | Status: AC
  Administered 2017-07-31: 6 mg via INTRAVENOUS
  Filled 2017-07-31: qty 2

## 2017-07-31 MED ORDER — SODIUM CHLORIDE 0.9 % IV SOLN
3.0000 g | Freq: Two times a day (BID) | INTRAVENOUS | Status: DC
  Administered 2017-07-31 – 2017-08-02 (×4): 3 g via INTRAVENOUS
  Filled 2017-07-31 (×5): qty 3

## 2017-07-31 MED ORDER — LACTATED RINGERS IV BOLUS
500.0000 mL | Freq: Once | INTRAVENOUS | Status: AC
  Administered 2017-07-31: 500 mL via INTRAVENOUS

## 2017-07-31 MED ORDER — ALBUTEROL SULFATE HFA 108 (90 BASE) MCG/ACT IN AERS: 2.0000 | INHALATION_SPRAY | Freq: Four times a day (QID) | RESPIRATORY_TRACT | Status: DC | PRN

## 2017-07-31 MED ORDER — INSULIN ASPART 100 UNIT/ML ~~LOC~~ SOLN
0.0000 [IU] | Freq: Every day | SUBCUTANEOUS | Status: DC
  Administered 2017-08-01 – 2017-08-16 (×3): 0 [IU] via SUBCUTANEOUS

## 2017-07-31 MED ORDER — ALBUTEROL SULFATE (2.5 MG/3ML) 0.083% IN NEBU
2.5000 mg | INHALATION_SOLUTION | Freq: Four times a day (QID) | RESPIRATORY_TRACT | Status: DC | PRN
  Administered 2017-08-12 – 2017-08-16 (×4): 2.5 mg via RESPIRATORY_TRACT
  Filled 2017-07-31 (×4): qty 3

## 2017-07-31 MED ORDER — APIXABAN 5 MG PO TABS
5.0000 mg | ORAL_TABLET | Freq: Two times a day (BID) | ORAL | Status: DC
Start: 2017-07-31 — End: 2017-08-03
  Administered 2017-07-31 – 2017-08-03 (×6): 5 mg via ORAL
  Filled 2017-07-31 (×6): qty 1

## 2017-07-31 MED ORDER — LEVOTHYROXINE SODIUM 25 MCG PO TABS
125.0000 ug | ORAL_TABLET | Freq: Every day | ORAL | Status: DC
  Administered 2017-08-01 – 2017-08-17 (×17): 125 ug via ORAL
  Filled 2017-07-31 (×17): qty 1

## 2017-07-31 MED ORDER — FUROSEMIDE 40 MG PO TABS: 60.0000 mg | ORAL_TABLET | Freq: Every day | ORAL | Status: DC

## 2017-07-31 MED ORDER — AMIODARONE HCL 200 MG PO TABS
200.0000 mg | ORAL_TABLET | Freq: Every day | ORAL | Status: DC
  Administered 2017-07-31 – 2017-08-17 (×18): 200 mg via ORAL
  Filled 2017-07-31 (×18): qty 1

## 2017-07-31 MED ORDER — DICYCLOMINE HCL 10 MG/ML IM SOLN
20.0000 mg | Freq: Once | INTRAMUSCULAR | Status: AC
  Administered 2017-07-31: 20 mg via INTRAMUSCULAR
  Filled 2017-07-31: qty 2

## 2017-07-31 MED ORDER — SERTRALINE HCL 50 MG PO TABS
50.0000 mg | ORAL_TABLET | Freq: Every day | ORAL | Status: DC
  Administered 2017-07-31 – 2017-08-17 (×18): 50 mg via ORAL
  Filled 2017-07-31 (×18): qty 1

## 2017-07-31 MED ORDER — ACETAMINOPHEN 650 MG RE SUPP: 650.0000 mg | Freq: Four times a day (QID) | RECTAL | Status: DC | PRN

## 2017-07-31 MED ORDER — INSULIN ASPART 100 UNIT/ML ~~LOC~~ SOLN
0.0000 [IU] | Freq: Three times a day (TID) | SUBCUTANEOUS | Status: DC
  Administered 2017-08-02 – 2017-08-10 (×5): 1 [IU] via SUBCUTANEOUS
  Administered 2017-08-11: 2 [IU] via SUBCUTANEOUS
  Administered 2017-08-11: 1 [IU] via SUBCUTANEOUS
  Administered 2017-08-12: 2 [IU] via SUBCUTANEOUS
  Administered 2017-08-13: 3 [IU] via SUBCUTANEOUS
  Administered 2017-08-14 (×2): 2 [IU] via SUBCUTANEOUS
  Administered 2017-08-15 – 2017-08-16 (×2): 1 [IU] via SUBCUTANEOUS
  Administered 2017-08-17: 2 [IU] via SUBCUTANEOUS
  Administered 2017-08-17: 1 [IU] via SUBCUTANEOUS

## 2017-07-31 MED ORDER — ONDANSETRON HCL 4 MG/2ML IJ SOLN
4.0000 mg | Freq: Once | INTRAMUSCULAR | Status: AC
  Administered 2017-07-31: 4 mg via INTRAVENOUS
  Filled 2017-07-31: qty 2

## 2017-07-31 MED ORDER — CARVEDILOL 25 MG PO TABS
25.0000 mg | ORAL_TABLET | Freq: Two times a day (BID) | ORAL | Status: DC
  Administered 2017-07-31 – 2017-08-03 (×6): 25 mg via ORAL
  Filled 2017-07-31 (×6): qty 1

## 2017-07-31 MED ORDER — SODIUM CHLORIDE 0.9 % IV SOLN
100.0000 mg | Freq: Once | INTRAVENOUS | Status: DC
  Administered 2017-07-31: 100 mg via INTRAVENOUS
  Filled 2017-07-31: qty 100

## 2017-07-31 MED ORDER — ISOSORB DINITRATE-HYDRALAZINE 20-37.5 MG PO TABS
1.0000 | ORAL_TABLET | Freq: Two times a day (BID) | ORAL | Status: DC
  Administered 2017-07-31 – 2017-08-17 (×32): 1 via ORAL
  Filled 2017-07-31 (×36): qty 1

## 2017-07-31 MED ORDER — ACETAMINOPHEN 325 MG PO TABS
650.0000 mg | ORAL_TABLET | Freq: Four times a day (QID) | ORAL | Status: DC | PRN
  Administered 2017-08-09 – 2017-08-13 (×3): 650 mg via ORAL
  Filled 2017-07-31 (×3): qty 2

## 2017-07-31 MED ORDER — TRAZODONE HCL 50 MG PO TABS
50.0000 mg | ORAL_TABLET | Freq: Every evening | ORAL | Status: DC | PRN
  Administered 2017-08-01 – 2017-08-16 (×5): 50 mg via ORAL
  Filled 2017-07-31 (×5): qty 1

## 2017-07-31 MED ORDER — SODIUM CHLORIDE 0.9 % IV SOLN
2.0000 g | Freq: Once | INTRAVENOUS | Status: AC
  Administered 2017-07-31: 2 g via INTRAVENOUS
  Filled 2017-07-31: qty 20

## 2017-07-31 MED ORDER — METRONIDAZOLE IN NACL 5-0.79 MG/ML-% IV SOLN: 500.0000 mg | Freq: Once | INTRAVENOUS | Status: DC

## 2017-07-31 NOTE — ED Triage Notes (Signed)
Patient here today for Abdominal pain that is causing her to be SOB. The pain comes and goes and started yesterday and progressively got worse over the night. Patient states she vomited one time. Denies blood in vomit. Denies any diarrhea.

## 2017-07-31 NOTE — H&P (Signed)
History and Physical    Morgan Clay EUM:[id number] DOB: 08/03/1945 DOA: 07/31/2017  PCP: [name], DO Patient coming from: Home  I have personally briefly reviewed patient's old medical records in [place]  Chief Complaint: Belly pain  HPI: Morgan Clay is a 72 y.o. female with medical history significant for NICM (last EF 45%) s/p CRT-D, A. fib on Eliquis with recent DCCV on 3/19, OSA on CPAP, HCV status post Harvoni (viremic response not documented), hypothyroidism, depression and diet managed type 2 diabetes mellitus who presents to the ED with 5 days of diffuse, generalized abdominal pain, with associated anorexia, nausea and one episode of profuse nonbloody, nonbilious emesis on the day of presentation.  She reports that the symptoms began a few days after her cardioversion.  She states that her oral intake has been quite poor over the last 3 days due to her symptoms.  She denies sick contacts, no recent travel.  Of note, the patient recently started taking amiodarone in early March, which is being titrated to a stable dose by cardiology.  She denies fever, chills, myalgias, constipation, diarrhea, hematochezia, melena dysuria, hematuria. She does endorse stable orthopnea and chronic dry cough, neither of which have changed recently.  ED Course: In the ED, patient is afebrile, normotensive and saturating comfortably on room air.  Labs notable for WBC 11.9, Hgb 14.8, platelets 188, normal electrolytes, BUN 54, creatinine 2.19 (baseline creatinine appears to be near 1.5), initial troponin 0 0.04, BNP 548 (last BNP 1100 in October 2018), lipase within normal limits, normal LFTs, TSH within normal limits 2 weeks prior to presentation.  CT abdomen pelvis without contrast in the ED demonstrated patchy bibasilar opacities, scattered sigmoid diverticula with wall thickening consistent with diverticulitis, air-fluid levels within the small bowel consistent with likely enteritis,  and nodular liver contour possibly consistent with early cirrhosis.  In the ED, patient received Bentyl, morphine, Zofran, Rocephin, doxycycline and Flagyl.  Review of Systems: As per HPI otherwise 10 point review of systems negative.    Past Medical History:  Diagnosis Date  . Arthritis   . Asthma    reports mild asthma since childhood - had COPD on dx list from prior PCP  . Atypical atrial flutter ([place]) 09/28/2015  . Chronic systolic congestive heart failure ([place])   . DEPRESSION 12/17/2009   Annotation: PHQ-9 score = 14 done on 12/17/2009 Qualifier: Diagnosis of  By: Hassell Done FNP, Tori Milks    . FIBROIDS, UTERUS 03/05/2008  . Glaucoma   . HEPATITIS C - s/p treatment with Harvoni, seeing hepatology, Dawn Drazek 01/04/2007  . Hyperlipemia 12/06/2012  . HYPERTENSION, BENIGN 04/24/2007  . Hypothyroidism   . LBBB (left bundle branch block)   . Nonischemic cardiomyopathy ([place])   . OBESITY 05/27/2009  . OBSTRUCTIVE SLEEP APNEA 11/14/2007   npsg 2009:  Mild osa with AHI 11/hr. Started cpap 2009, quit using due to lack of response Home sleep testing 09/2010 with AHI only 7/hr and weight loss advised by pulmonology.  . OSTEOPENIA 09/30/2008  . Personal history of other infectious and parasitic disease    Hepatitis B    Past Surgical History:  Procedure Laterality Date  . appendectomy    . CARDIAC CATHETERIZATION    . CARDIOVERSION N/A 12/14/2014   Procedure: CARDIOVERSION;  Surgeon: Dorothy Spark, MD;  Location: [facility] ENDOSCOPY;  Service: Cardiovascular;  Laterality: N/A;  . CARDIOVERSION N/A 02/26/2017   Procedure: CARDIOVERSION;  Surgeon: Evans Lance, MD;  Location: [place] CV LAB;  Service: Cardiovascular;  Laterality: N/A;  . CARDIOVERSION N/A 07/24/2017   Procedure: CARDIOVERSION;  Surgeon: Thayer Headings, MD;  Location: [facility] ENDOSCOPY;  Service: Cardiovascular;  Laterality: N/A;  . EP IMPLANTABLE DEVICE N/A 10/06/2015   Procedure: BIV ICD Generator Changeout;  Surgeon: [name], MD;  Location: [place] CV LAB;  Service: Cardiovascular;  Laterality: N/A;     reports that she has never smoked. She has never used smokeless tobacco. She reports that she drinks alcohol. She reports that she does not use drugs.  No Known Allergies  Family History  Problem Relation Age of Onset  . Asthma Father   . Heart attack Father   . Asthma Sister   . Cancer Sister        lung  . Heart attack Mother   . Stroke Brother   . Diabetes Unknown     Prior to Admission medications   Medication Sig Start Date End Date Taking? Authorizing Provider  amiodarone (PACERONE) 200 MG tablet 2/20-2/26 take 2 tablets, 400mg  twice/day 2/27-3/19- take 1 tablet, 200mg  twice/day March 20- one tablet, 200mg  daily Patient taking differently: Take 200 mg by mouth 2 (two) times daily. 2/20-2/26 take 2 tablets, 400mg  twice/day 2/27-3/19- take 1 tablet, 200mg  twice/day March 20- one tablet, 200mg  daily 06/26/17  Yes Seiler, [employer] K, NP  BIDIL 20-37.5 MG tablet TAKE 1 TABLET BY MOUTH TWICE DAILY Patient taking differently: TAKE 1 TABLET (37.5 mg) BY MOUTH TWICE DAILY 04/26/17  Yes [name], MD  carvedilol (COREG) 25 MG tablet Take 1 tablet (25 mg total) by mouth 2 (two) times daily. 03/02/17 07/21/18 Yes [name], MD  ELIQUIS 5 MG TABS tablet TAKE ONE TABLET BY MOUTH TWICE DAILY 07/11/17  Yes [name], MD  ENTRESTO 97-103 MG TAKE 1 TABLET BY MOUTH TWICE DAILY **TITRATED  DOSE  UP** 04/27/17  Yes [name], MD  furosemide (LASIX) 40 MG tablet Take 60 mg by mouth daily.   Yes [provider]  levothyroxine (SYNTHROID, LEVOTHROID) 125 MCG tablet Take 1 tablet (125 mcg total) by mouth daily. Patient taking differently: Take 125 mcg by mouth daily before breakfast.  06/12/17  Yes Colin Benton R, DO  sertraline (ZOLOFT) 50 MG tablet Take 1 tablet (50 mg total) by mouth daily. 06/26/17  Yes [name], DO  spironolactone (ALDACTONE) 25 MG tablet Take 1 tablet (25 mg total)  daily by mouth. 03/20/17  Yes Imogene Burn, PA-C  Tetrahydrozoline HCl (VISINE OP) Place 1 drop into both eyes daily as needed (DRY EYES).   Yes [provider]  traZODone (DESYREL) 50 MG tablet Take 1 tablet (50 mg total) by mouth at bedtime. Patient taking differently: Take 50 mg by mouth at bedtime as needed for sleep.  06/07/17  Yes Colin Benton R, DO  albuterol (PROVENTIL HFA;VENTOLIN HFA) 108 (90 BASE) MCG/ACT inhaler Inhale 2 puffs into the lungs every 6 (six) hours as needed for wheezing or shortness of breath. Patient not taking: Reported on 07/31/2017 01/01/15   [name], DO  fluticasone [facility]) 50 MCG/ACT nasal spray Place 2 sprays into both nostrils daily. Patient not taking: Reported on 07/31/2017 01/24/17   Dorothyann Peng, NP  nitroGLYCERIN (NITROSTAT) 0.4 MG SL tablet Place 0.4 mg under the tongue every 5 (five) minutes as needed for chest pain.    [provider]    Physical Exam: Vitals:   07/31/17 1525 07/31/17 1615 07/31/17 1700 07/31/17 1815  BP: [name])  168/68 (!) 149/62 (!) 135/57 (!) 141/65  Pulse: 72 72 71 72  Resp: 20 (!) 23 (!) 25 20  Temp:      SpO2: 100% 100% 100% 100%  Weight:      Height:        Constitutional: NAD, calm, comfortable Eyes: PERRL, lids and conjunctivae normal ENMT: Mucous membranes are dry. Posterior pharynx clear of any exudate or lesions.  Neck: normal, supple, no masses Respiratory: diminished at the bases bilaterally, no wheezes or crackles Cardiovascular: Regular rate and rhythm, no murmurs / rubs / gallops. No extremity edema. 2+ pedal pulses. Abdomen: tenderness most prominent in LLQ, normoactive bowel sounds Musculoskeletal: no clubbing / cyanosis. No joint deformity upper and lower extremities. Good ROM, no contractures.  Skin: no rashes, lesions, ulcers. No induration Neurologic: CN 2-12 grossly intact. Sensation intact, DTR normal. Strength 5/5 in all 4.  Psychiatric: Normal judgment and insight. Alert and  oriented x 3. Normal mood.   Labs on Admission: I have personally reviewed following labs and imaging studies  CBC: Recent Labs  Lab 07/31/17 1229  WBC 11.9*  HGB 14.8  HCT 43.9  MCV 88.5  PLT 147   Basic Metabolic Panel: Recent Labs  Lab 07/31/17 1229  NA 136  K 4.4  CL 102  CO2 22  GLUCOSE 140*  BUN 54*  CREATININE 2.19*  CALCIUM 9.6   GFR: Estimated Creatinine Clearance: 20.4 mL/min (A) (by C-G formula based on SCr of 2.19 mg/dL (H)). Liver Function Tests: Recent Labs  Lab 07/31/17 1537  AST 16  ALT 13*  ALKPHOS 69  BILITOT 1.0  PROT 9.3*  ALBUMIN 4.6   Recent Labs  Lab 07/31/17 1537  LIPASE 25   No results for input(s): AMMONIA in the last 168 hours. Coagulation Profile: Recent Labs  Lab 07/31/17 1537  INR 1.37   Cardiac Enzymes: Recent Labs  Lab 07/31/17 1537  TROPONINI 0.04*   BNP (last 3 results) No results for input(s): PROBNP in the last 8760 hours. HbA1C: No results for input(s): HGBA1C in the last 72 hours. CBG: No results for input(s): GLUCAP in the last 168 hours. Lipid Profile: No results for input(s): CHOL, HDL, LDLCALC, TRIG, CHOLHDL, LDLDIRECT in the last 72 hours. Thyroid Function Tests: No results for input(s): TSH, T4TOTAL, FREET4, T3FREE, THYROIDAB in the last 72 hours. Anemia Panel: No results for input(s): VITAMINB12, FOLATE, FERRITIN, TIBC, IRON, RETICCTPCT in the last 72 hours. Urine analysis:    Component Value Date/Time   COLORURINE YELLOW 10/02/2015 0330   APPEARANCEUR CLEAR 10/02/2015 0330   LABSPEC 1.016 10/02/2015 0330   PHURINE 5.5 10/02/2015 0330   GLUCOSEU NEGATIVE 10/02/2015 0330   HGBUR TRACE (A) 10/02/2015 0330   HGBUR negative 02/28/2008 0000   BILIRUBINUR NEGATIVE 10/02/2015 0330   KETONESUR NEGATIVE 10/02/2015 0330   PROTEINUR >300 (A) 10/02/2015 0330   UROBILINOGEN 1.0 07/19/2010 1752   NITRITE NEGATIVE 10/02/2015 0330   LEUKOCYTESUR NEGATIVE 10/02/2015 0330    Radiological Exams on  Admission: Ct Abdomen Pelvis Wo Contrast  Result Date: 07/31/2017 CLINICAL DATA:  Abdominal pain, distention, nausea, and vomiting. EXAM: CT ABDOMEN AND PELVIS WITHOUT CONTRAST TECHNIQUE: Multidetector CT imaging of the abdomen and pelvis was performed following the standard protocol without IV contrast. COMPARISON:  Right upper quadrant abdominal ultrasound 02/23/2014. Renal ultrasound 10/01/2013. Abdominal ultrasound 04/06/2011. FINDINGS: Lower chest: Patchy consolidation in both lower lobes, incompletely imaged. No pleural effusion. Partially visualized ICD leads and cardiomegaly. Hepatobiliary: Slightly nodular liver contour without focal abnormality  identified on this unenhanced study. The layering hyperdense material in the gallbladder lumen without gross wall thickening or pericholecystic inflammation. No biliary dilatation. Pancreas: Unremarkable. Spleen: Unremarkable. Adrenals/Urinary Tract: Unremarkable adrenal glands. No evidence of renal mass, calculi, or hydronephrosis. Mildly distended urinary bladder without evidence of wall thickening. Stomach/Bowel: The stomach is within normal limits. Scattered diverticula are noted in the distal descending and sigmoid colon, and there is the suggestion of mild wall thickening of the proximal sigmoid colon with mild surrounding fat stranding. Scattered air-fluid levels are present in multiple small bowel loops without bowel dilatation suggestive of a significant mechanical obstruction. There is a question of mild wall thickening involving a few small bowel loops in the left mid to lower abdomen. Prior appendectomy. Vascular/Lymphatic: Abdominal aortic atherosclerosis without aneurysm. No enlarged lymph nodes. Reproductive: Multiple calcified uterine fibroids including a 6 cm anterior fibroid resulting in impression on the bladder. No adnexal mass. Other: No intraperitoneal free fluid. Mild nonspecific haziness throughout the mesenteric fat. No abdominal wall  hernia. Musculoskeletal: Lower lumbar disc bulging and moderate facet arthrosis. IMPRESSION: 1. Suspected mild acute diverticulitis involving the proximal sigmoid colon. 2. Scattered small bowel air-fluid levels and with questionable wall thickening of a few left-sided small bowel loops, query enteritis. 3. Bilateral lower lobe lung opacities compatible with pneumonia. 4. Slightly nodular contour of the liver which could reflect mild changes of cirrhosis. 5. Layering density in the gallbladder lumen may represent concentrated bile and/or small stones. 6. Fibroid uterus. Electronically Signed   By: Logan Bores M.D.   On: 07/31/2017 18:17   Dg Chest 2 View  Result Date: 07/31/2017 CLINICAL DATA:  Chest pain short of breath EXAM: CHEST - 2 VIEW COMPARISON:  03/22/2017 FINDINGS: Hypoventilation with bibasilar atelectasis/infiltrate. Negative for heart failure or effusion. AICD unchanged in position IMPRESSION: Hypoventilation with bibasilar atelectasis/infiltrate. Electronically Signed   By: Franchot Gallo M.D.   On: 07/31/2017 13:04    EKG: Independently reviewed. Paced rhythm.  Assessment/Plan Active Problems:   Diverticulitis  Diverticulitis, enteritis Symptoms and imaging consistent with diverticulitis and possible contribution of enteritis, given recent nausea and vomiting.  No obstruction noted on CT, however contrast not given due to renal function.  Mild leukocytosis is present as well.  There were some concerns for possible pneumonia noted by the ED, however patient does not have symptoms that are consistent with respiratory process at this time. -Transition to Unasyn; anticipate de-escalation once symptoms are improving -Gentle LR bolus given history of heart failure as patient appears volume depleted on my exam -Will start with liquid diet, ADAT -Antiemetics PRN -Will try to minimize opiates for pain control -Trend fever curve, daily CBC  AKI, likely prerenal Baseline creatinine  appears to be approximately 1.5.  AKI likely in the setting of poor p.o. intake and concurrent Entresto use.  Protein gap is present, which along with worsening trending renal function over the last few years may represent abnormal process such as myeloma. -Gentle IVF as above -Hold Entresto, Aldactone -Avoid nephrotoxins -Obtain U/A, urine studies, urine protein to creatinine -Consider SPEP, UPEP, free light chains -Strict I/O, daily weights -Daily BMP, mag, PO4  Chronic systolic heart failure s/p CRT-D Afib on Eliquis -Continue home amiodarone; per taper instructions, dose should be adjusted to 200 mg daily -Continue home Eliquis -Holding Entresto, Aldactone as above; otherwise continue hydralazine, isordil, Coreg -Will hold Lasix tomorrow; tentative plan to resume on 3/28  Hypothyroidism -Continue home Synthroid  OSA on CPAP -Continue PAP at home settings nightly  and PRN  HCV s/p Harvoni -Would ensure SVR with HCV RNA; can be done as outpatient  Depression -Continue home Prozac, trazodone  DM2, diet controlled -Last A1c 6.8% -SSI, FSBS ACHS  DVT prophylaxis: Eliquis Code Status: Full Disposition Plan: Home in 2 days Consults called: None Admission status: Inpatient   Bennie Pierini MD [facility]  If 7PM-7AM, please contact night-coverage [email or web address] Password [facility]  07/31/2017, 8:23 PM

## 2017-07-31 NOTE — ED Notes (Signed)
ED TO INPATIENT HANDOFF REPORT  Name/Age/Gender Morgan Clay 72 y.o. female  Code Status Code Status History    Date Active Date Inactive Code Status Order ID Comments User Context   02/23/2017 0040 03/01/2017 1553 Full Code [id number]  Rise Patience, MD ED   10/02/2015 0404 10/03/2015 1943 Full Code [id number]  Norval Morton, MD ED      Home/SNF/Other Home  Chief Complaint Abd Pain  Level of Care/Admitting Diagnosis ED Disposition    ED Disposition Condition [facility] Area: [facility] [[id number]]  Level of Care: Med-Surg [16]  Diagnosis: Diverticulitis [459977]  Admitting Physician: Bennie Pierini [[id number]]  Attending Physician: Jonnie Finner, [place] [[id number]]  Estimated length of stay: past midnight tomorrow  Certification:: I certify this patient will need inpatient services for at least 2 midnights  PT Class (Do Not Modify): Inpatient [101]  PT Acc Code (Do Not Modify): Private [1]       Medical History Past Medical History:  Diagnosis Date  . Arthritis   . Asthma    reports mild asthma since childhood - had COPD on dx list from prior PCP  . Atypical atrial flutter ([place]) 09/28/2015  . Chronic systolic congestive heart failure ([place])   . DEPRESSION 12/17/2009   Annotation: PHQ-9 score = 14 done on 12/17/2009 Qualifier: Diagnosis of  By: Hassell Done FNP, Tori Milks    . FIBROIDS, UTERUS 03/05/2008  . Glaucoma   . HEPATITIS C - s/p treatment with Harvoni, seeing hepatology, Dawn Drazek 01/04/2007  . Hyperlipemia 12/06/2012  . HYPERTENSION, BENIGN 04/24/2007  . Hypothyroidism   . LBBB (left bundle branch block)   . Nonischemic cardiomyopathy ([place])   . OBESITY 05/27/2009  . OBSTRUCTIVE SLEEP APNEA 11/14/2007   npsg 2009:  Mild osa with AHI 11/hr. Started cpap 2009, quit using due to lack of response Home sleep testing 09/2010 with AHI only 7/hr and weight loss advised by pulmonology.  . OSTEOPENIA 09/30/2008  . Personal history of  other infectious and parasitic disease    Hepatitis B    Allergies No Known Allergies  IV Location/Drains/Wounds Patient Lines/Drains/Airways Status   Active Line/Drains/Airways    Name:   Placement date:   Placement time:   Site:   Days:   Peripheral IV 07/31/17 Right Antecubital   07/31/17    1542    Antecubital   less than 1          Labs/Imaging Results for orders placed or performed during the hospital encounter of 07/31/17 (from the past 48 hour(s))  Basic metabolic panel     Status: Abnormal   Collection Time: 07/31/17 12:29 PM  Result Value Ref Range   Sodium 136 135 - 145 mmol/L   Potassium 4.4 3.5 - 5.1 mmol/L   Chloride 102 101 - 111 mmol/L   CO2 22 22 - 32 mmol/L   Glucose, Bld 140 (H) 65 - 99 mg/dL   BUN 54 (H) 6 - 20 mg/dL   Creatinine, Ser 2.19 (H) 0.44 - 1.00 mg/dL   Calcium 9.6 8.9 - 10.3 mg/dL   GFR calc non Af Amer 22 (L) >60 mL/min   GFR calc Af Amer 25 (L) >60 mL/min    Comment: (NOTE) The eGFR has been calculated using the CKD EPI equation. This calculation has not been validated in all clinical situations. eGFR's persistently <60 mL/min signify possible Chronic Kidney Disease.    Anion gap 12 5 - 15    Comment: Performed  at [facility], [place] [address] [place], [place] [address]  CBC     Status: Abnormal   Collection Time: 07/31/17 12:29 PM  Result Value Ref Range   WBC 11.9 (H) 4.0 - 10.5 K/uL   RBC 4.96 3.87 - 5.11 MIL/uL   Hemoglobin 14.8 12.0 - 15.0 g/dL   HCT 43.9 36.0 - 46.0 %   MCV 88.5 78.0 - 100.0 fL   MCH 29.8 26.0 - 34.0 pg   MCHC 33.7 30.0 - 36.0 g/dL   RDW 15.4 11.5 - 15.5 %   Platelets 188 150 - 400 K/uL    Comment: Performed at [facility], [place] [address], [place], [place] [address]  Brain natriuretic peptide     Status: Abnormal   Collection Time: 07/31/17 12:29 PM  Result Value Ref Range   B Natriuretic Peptide 547.9 (H) 0.0 - 100.0 pg/mL    Comment: Performed at [facility] [place] [address], [place], [place] [address]  Hepatic function panel     Status: Abnormal   Collection Time: 07/31/17  3:37 PM  Result Value Ref Range   Total Protein 9.3 (H) 6.5 - 8.1 g/dL   Albumin 4.6 3.5 - 5.0 g/dL   AST 16 15 - 41 U/L   ALT 13 (L) 14 - 54 U/L   Alkaline Phosphatase 69 38 - 126 U/L   Total Bilirubin 1.0 0.3 - 1.2 mg/dL   Bilirubin, Direct 0.2 0.1 - 0.5 mg/dL   Indirect Bilirubin 0.8 0.3 - 0.9 mg/dL    Comment: Performed at [facility], [place] [address] [place], [place] [address]  Troponin I     Status: Abnormal   Collection Time: 07/31/17  3:37 PM  Result Value Ref Range   Troponin I 0.04 (HH) <0.03 ng/mL    Comment: CRITICAL RESULT CALLED TO, READ BACK BY AND VERIFIED WITH: WEST,S @ 1630 ON [id number] BY POTEAT,S Performed at [place] [address], [place], [place] [address]   Lipase, blood     Status: None   Collection Time: 07/31/17  3:37 PM  Result Value Ref Range   Lipase 25 11 - 51 U/L    Comment: Performed at [facility], [place] [address] [place], [place] [address]  Protime-INR     Status: Abnormal   Collection Time: 07/31/17  3:37 PM  Result Value Ref Range   Prothrombin Time 16.8 (H) 11.4 - 15.2 seconds   INR 1.37     Comment: Performed at [facility], [place] [address] [place], [place] [address]  I-Stat CG4 Lactic Acid, ED     Status: None   Collection Time: 07/31/17  3:46 PM  Result Value Ref Range   Lactic Acid, Venous 0.90 0.5 - 1.9 mmol/L   Ct Abdomen Pelvis Wo Contrast  Result Date: 07/31/2017 CLINICAL DATA:  Abdominal pain, distention, nausea, and vomiting. EXAM: CT ABDOMEN AND PELVIS WITHOUT CONTRAST TECHNIQUE: Multidetector CT imaging of the abdomen and pelvis was performed following the standard protocol without IV contrast. COMPARISON:  Right upper quadrant abdominal ultrasound 02/23/2014. Renal ultrasound 10/01/2013. Abdominal ultrasound  04/06/2011. FINDINGS: Lower chest: Patchy consolidation in both lower lobes, incompletely imaged. No pleural effusion. Partially visualized ICD leads and cardiomegaly. Hepatobiliary: Slightly nodular liver contour without focal abnormality identified on this unenhanced study. The layering hyperdense material in the gallbladder lumen without gross wall thickening or pericholecystic inflammation. No biliary dilatation. Pancreas: Unremarkable. Spleen: Unremarkable. Adrenals/Urinary  Tract: Unremarkable adrenal glands. No evidence of renal mass, calculi, or hydronephrosis. Mildly distended urinary bladder without evidence of wall thickening. Stomach/Bowel: The stomach is within normal limits. Scattered diverticula are noted in the distal descending and sigmoid colon, and there is the suggestion of mild wall thickening of the proximal sigmoid colon with mild surrounding fat stranding. Scattered air-fluid levels are present in multiple small bowel loops without bowel dilatation suggestive of a significant mechanical obstruction. There is a question of mild wall thickening involving a few small bowel loops in the left mid to lower abdomen. Prior appendectomy. Vascular/Lymphatic: Abdominal aortic atherosclerosis without aneurysm. No enlarged lymph nodes. Reproductive: Multiple calcified uterine fibroids including a 6 cm anterior fibroid resulting in impression on the bladder. No adnexal mass. Other: No intraperitoneal free fluid. Mild nonspecific haziness throughout the mesenteric fat. No abdominal wall hernia. Musculoskeletal: Lower lumbar disc bulging and moderate facet arthrosis. IMPRESSION: 1. Suspected mild acute diverticulitis involving the proximal sigmoid colon. 2. Scattered small bowel air-fluid levels and with questionable wall thickening of a few left-sided small bowel loops, query enteritis. 3. Bilateral lower lobe lung opacities compatible with pneumonia. 4. Slightly nodular contour of the liver which could  reflect mild changes of cirrhosis. 5. Layering density in the gallbladder lumen may represent concentrated bile and/or small stones. 6. Fibroid uterus. Electronically Signed   By: Logan Bores M.D.   On: 07/31/2017 18:17   Dg Chest 2 View  Result Date: 07/31/2017 CLINICAL DATA:  Chest pain short of breath EXAM: CHEST - 2 VIEW COMPARISON:  03/22/2017 FINDINGS: Hypoventilation with bibasilar atelectasis/infiltrate. Negative for heart failure or effusion. AICD unchanged in position IMPRESSION: Hypoventilation with bibasilar atelectasis/infiltrate. Electronically Signed   By: Franchot Gallo M.D.   On: 07/31/2017 13:04    Pending Labs Unresulted Labs (From admission, onward)   Start     Ordered   07/31/17 1527  Urinalysis, Routine w reflex microscopic  Once,   R     07/31/17 1526   Signed and Held  Magnesium  Add-on,   R     Signed and Held   Signed and Held  Phosphorus  Add-on,   R     Signed and Held   Signed and Held  Troponin I  Now then every 6 hours,   R     Signed and Held   Signed and Occupational hygienist morning,   R     Signed and Held   Signed and Held  CBC  Tomorrow morning,   R     Signed and Held   Signed and Held  Protein / creatinine ratio, urine  Once,   R     Signed and Held   Signed and Held  Na and K (sodium & potassium), rand urine  Once,   R     Signed and Held   Signed and Held  Calcium / creatinine ratio, urine  Once,   R     Signed and Held      Vitals/Pain Today's Vitals   07/31/17 1800 07/31/17 1815 07/31/17 1947 07/31/17 1947  BP:  (!) 141/65    Pulse:  72    Resp:  20    Temp:      SpO2:  100%    Weight:      Height:      PainSc: 6   10-Worst pain ever 10-Worst pain ever    Isolation Precautions No active isolations  Medications Medications  metroNIDAZOLE (FLAGYL) IVPB 500 mg (has no administration in time range)  doxycycline (VIBRAMYCIN) 100 mg in sodium chloride 0.9 % 250 mL IVPB (100 mg Intravenous New Bag/Given 07/31/17  1943)  ondansetron (ZOFRAN) injection 4 mg (4 mg Intravenous Given 07/31/17 1542)  morphine 4 MG/ML injection 6 mg (6 mg Intravenous Given 07/31/17 1543)  cefTRIAXone (ROCEPHIN) 2 g in sodium chloride 0.9 % 100 mL IVPB (0 g Intravenous Stopped 07/31/17 1919)  morphine 4 MG/ML injection 6 mg (6 mg Intravenous Given 07/31/17 1848)  dicyclomine (BENTYL) injection 20 mg (20 mg Intramuscular Given 07/31/17 1849)    Mobility walks

## 2017-07-31 NOTE — ED Provider Notes (Signed)
[facility] DEPT Provider Note   CSN: [id number] Arrival date & time: 07/31/17  1031     History   Chief Complaint Chief Complaint  Patient presents with  . Abdominal Pain  . Shortness of Breath    HPI Morgan Clay is a 72 y.o. female.  HPI   72 yo F with h/o NICM, pAFib s/p recent cardioversion 3/19, on Eliquis, HTN, hep C, HLD, HTN, here with abdominal pain and SOB. Pt reports sx started acutely 2 days ago with severe, generalized, aching, throbbing abd pain. Pain has been worse with movement, palpation. She's had nausea, emesis x 1 with dark green emesis. She's had constipation x several days, small loose "jelly like" stools yesterday night but nothing since then. No h/o bowel obstruction. She is s/p appendectomy. Denies any vaginal bleeding or discharge. Denies any urinary symptoms. No alleviating factors.   Of note, pt had recent DCCV on Eliquis. She does state she had a mild abd pain after DCCV but nothing as severe as currently.   Past Medical History:  Diagnosis Date  . Arthritis   . Asthma    reports mild asthma since childhood - had COPD on dx list from prior PCP  . Atypical atrial flutter ([place]) 09/28/2015  . Chronic systolic congestive heart failure ([place])   . DEPRESSION 12/17/2009   Annotation: PHQ-9 score = 14 done on 12/17/2009 Qualifier: Diagnosis of  By: Hassell Done FNP, Tori Milks    . FIBROIDS, UTERUS 03/05/2008  . Glaucoma   . HEPATITIS C - s/p treatment with Harvoni, seeing hepatology, Dawn Drazek 01/04/2007  . Hyperlipemia 12/06/2012  . HYPERTENSION, BENIGN 04/24/2007  . Hypothyroidism   . LBBB (left bundle branch block)   . Nonischemic cardiomyopathy ([place])   . OBESITY 05/27/2009  . OBSTRUCTIVE SLEEP APNEA 11/14/2007   npsg 2009:  Mild osa with AHI 11/hr. Started cpap 2009, quit using due to lack of response Home sleep testing 09/2010 with AHI only 7/hr and weight loss advised by pulmonology.  . OSTEOPENIA 09/30/2008  . Personal  history of other infectious and parasitic disease    Hepatitis B    Patient Active Problem List   Diagnosis Date Noted  . Persistent atrial fibrillation ([place])   . Hypertension associated with diabetes ([place]) 06/07/2017  . MDD (major depressive disorder), recurrent, in partial remission ([place]) 06/07/2017  . Asthma 05/22/2017  . Chronic obstructive pulmonary disease ([place])   . Wheezes   . CHF (congestive heart failure) ([place]) 02/23/2017  . Atypical atrial flutter ([place]) 09/28/2015  . Type 2 diabetes, controlled, with renal manifestation ([place]) 11/24/2013  . Chronic kidney disease - followed by [facility] 11/24/2013  . Nonischemic cardiomyopathy ([place]) 11/22/2010  . Obesity 05/27/2009  . OSTEOPENIA 09/30/2008  . OBSTRUCTIVE SLEEP APNEA 11/14/2007  . Chronic systolic heart failure ([place]) 04/24/2007  . Hypothyroidism 01/28/2007  . Biventricular ICD (implantable cardioverter-defibrillator) in place 01/08/2007    Past Surgical History:  Procedure Laterality Date  . appendectomy    . CARDIAC CATHETERIZATION    . CARDIOVERSION N/A 12/14/2014   Procedure: CARDIOVERSION;  Surgeon: Dorothy Spark, MD;  Location: [facility] ENDOSCOPY;  Service: Cardiovascular;  Laterality: N/A;  . CARDIOVERSION N/A 02/26/2017   Procedure: CARDIOVERSION;  Surgeon: Evans Lance, MD;  Location: [facility] LAB;  Service: Cardiovascular;  Laterality: N/A;  . CARDIOVERSION N/A 07/24/2017   Procedure: CARDIOVERSION;  Surgeon: Thayer Headings, MD;  Location: [facility];  Service: Cardiovascular;  Laterality: N/A;  . EP IMPLANTABLE DEVICE  N/A 10/06/2015   Procedure: BIV ICD Generator Changeout;  Surgeon: [name], MD;  Location: [facility];  Service: Cardiovascular;  Laterality: N/A;     OB History   None      Home Medications    Prior to Admission medications   Medication Sig Start Date End Date Taking? Authorizing Provider  amiodarone (PACERONE) 200 MG tablet 2/20-2/26 take 2 tablets, 400mg   twice/day 2/27-3/19- take 1 tablet, 200mg  twice/day March 20- one tablet, 200mg  daily Patient taking differently: Take 200 mg by mouth 2 (two) times daily. 2/20-2/26 take 2 tablets, 400mg  twice/day 2/27-3/19- take 1 tablet, 200mg  twice/day March 20- one tablet, 200mg  daily 06/26/17  Yes Seiler, [employer] K, NP  BIDIL 20-37.5 MG tablet TAKE 1 TABLET BY MOUTH TWICE DAILY Patient taking differently: TAKE 1 TABLET (37.5 mg) BY MOUTH TWICE DAILY 04/26/17  Yes [name], MD  carvedilol (COREG) 25 MG tablet Take 1 tablet (25 mg total) by mouth 2 (two) times daily. 03/02/17 07/21/18 Yes [name], MD  ELIQUIS 5 MG TABS tablet TAKE ONE TABLET BY MOUTH TWICE DAILY 07/11/17  Yes [name], MD  ENTRESTO 97-103 MG TAKE 1 TABLET BY MOUTH TWICE DAILY **TITRATED  DOSE  UP** 04/27/17  Yes [name], MD  furosemide (LASIX) 40 MG tablet Take 60 mg by mouth daily.   Yes [provider]  levothyroxine (SYNTHROID, LEVOTHROID) 125 MCG tablet Take 1 tablet (125 mcg total) by mouth daily. Patient taking differently: Take 125 mcg by mouth daily before breakfast.  06/12/17  Yes Colin Benton R, DO  sertraline (ZOLOFT) 50 MG tablet Take 1 tablet (50 mg total) by mouth daily. 06/26/17  Yes [name], DO  spironolactone (ALDACTONE) 25 MG tablet Take 1 tablet (25 mg total) daily by mouth. 03/20/17  Yes Imogene Burn, PA-C  Tetrahydrozoline HCl (VISINE OP) Place 1 drop into both eyes daily as needed (DRY EYES).   Yes [provider]  traZODone (DESYREL) 50 MG tablet Take 1 tablet (50 mg total) by mouth at bedtime. Patient taking differently: Take 50 mg by mouth at bedtime as needed for sleep.  06/07/17  Yes Colin Benton R, DO  albuterol (PROVENTIL HFA;VENTOLIN HFA) 108 (90 BASE) MCG/ACT inhaler Inhale 2 puffs into the lungs every 6 (six) hours as needed for wheezing or shortness of breath. Patient not taking: Reported on 07/31/2017 01/01/15   [name], DO  fluticasone [facility] 50 MCG/ACT nasal  spray Place 2 sprays into both nostrils daily. Patient not taking: Reported on 07/31/2017 01/24/17   Dorothyann Peng, NP  nitroGLYCERIN (NITROSTAT) 0.4 MG SL tablet Place 0.4 mg under the tongue every 5 (five) minutes as needed for chest pain.    [provider]    Family History Family History  Problem Relation Age of Onset  . Asthma Father   . Heart attack Father   . Asthma Sister   . Cancer Sister        lung  . Heart attack Mother   . Stroke Brother   . Diabetes Unknown     Social History Social History   Tobacco Use  . Smoking status: Never Smoker  . Smokeless tobacco: Never Used  Substance Use Topics  . Alcohol use: Yes    Comment: 3 glasses of wine 3-4 times per week  . Drug use: No     Allergies   Patient has no known allergies.   Review of Systems Review of  Systems  Constitutional: Positive for fatigue.  Respiratory: Positive for shortness of breath.   Gastrointestinal: Positive for abdominal distention, abdominal pain, constipation, nausea and vomiting.  All other systems reviewed and are negative.    Physical Exam Updated Vital Signs BP (!) 141/65   Pulse 72   Temp 98.2 F (36.8 C)   Resp 20   Ht 4\' 11"  (1.499 m)   Wt 68.5 kg (151 lb)   SpO2 100%   BMI 30.50 kg/m   Physical Exam  Constitutional: She is oriented to person, place, and time. She appears well-developed and well-nourished. She appears distressed (appears uncomfortable, in pain).  HENT:  Head: Normocephalic and atraumatic.  Eyes: Conjunctivae are normal.  Neck: Neck supple.  Cardiovascular: Normal rate, regular rhythm and normal heart sounds. Exam reveals no friction rub.  No murmur heard. Pulmonary/Chest: Effort normal and breath sounds normal. No respiratory distress. She has no wheezes. She has no rales.  Abdominal: Normal appearance. She exhibits distension. There is generalized tenderness. There is rebound and guarding. There is no rigidity.  Musculoskeletal: She  exhibits no edema.  Neurological: She is alert and oriented to person, place, and time. She exhibits normal muscle tone.  Skin: Skin is warm. Capillary refill takes less than 2 seconds.  Psychiatric: She has a normal mood and affect.  Nursing note and vitals reviewed.    ED Treatments / Results  Labs (all labs ordered are listed, but only abnormal results are displayed) Labs Reviewed  BASIC METABOLIC PANEL - Abnormal; Notable for the following components:      Result Value   Glucose, Bld 140 (*)    BUN 54 (*)    Creatinine, Ser 2.19 (*)    GFR calc non Af Amer 22 (*)    GFR calc Af Amer 25 (*)    All other components within normal limits  CBC - Abnormal; Notable for the following components:   WBC 11.9 (*)    All other components within normal limits  HEPATIC FUNCTION PANEL - Abnormal; Notable for the following components:   Total Protein 9.3 (*)    ALT 13 (*)    All other components within normal limits  BRAIN NATRIURETIC PEPTIDE - Abnormal; Notable for the following components:   B Natriuretic Peptide 547.9 (*)    All other components within normal limits  TROPONIN I - Abnormal; Notable for the following components:   Troponin I 0.04 (*)    All other components within normal limits  PROTIME-INR - Abnormal; Notable for the following components:   Prothrombin Time 16.8 (*)    All other components within normal limits  CULTURE, BLOOD (ROUTINE X 2)  CULTURE, BLOOD (ROUTINE X 2)  LIPASE, BLOOD  URINALYSIS, ROUTINE W REFLEX MICROSCOPIC  I-STAT CG4 LACTIC ACID, ED  I-STAT CG4 LACTIC ACID, ED    EKG EKG Interpretation  Date/Time:  Tuesday July 31 2017 11:03:03 EDT Ventricular Rate:  66 PR Interval:    QRS Duration: 123 QT Interval:  458 QTC Calculation: 480 R Axis:   -98 Text Interpretation:  Atrial-sensed ventricular-paced rhythm No further analysis attempted due to paced rhythm No significant change since last tracing Confirmed by Duffy Bruce [phone]) on  07/31/2017 3:20:23 PM   Radiology Ct Abdomen Pelvis Wo Contrast  Result Date: 07/31/2017 CLINICAL DATA:  Abdominal pain, distention, nausea, and vomiting. EXAM: CT ABDOMEN AND PELVIS WITHOUT CONTRAST TECHNIQUE: Multidetector CT imaging of the abdomen and pelvis was performed following the standard protocol without IV contrast. COMPARISON:  Right upper quadrant abdominal ultrasound 02/23/2014. Renal ultrasound 10/01/2013. Abdominal ultrasound 04/06/2011. FINDINGS: Lower chest: Patchy consolidation in both lower lobes, incompletely imaged. No pleural effusion. Partially visualized ICD leads and cardiomegaly. Hepatobiliary: Slightly nodular liver contour without focal abnormality identified on this unenhanced study. The layering hyperdense material in the gallbladder lumen without gross wall thickening or pericholecystic inflammation. No biliary dilatation. Pancreas: Unremarkable. Spleen: Unremarkable. Adrenals/Urinary Tract: Unremarkable adrenal glands. No evidence of renal mass, calculi, or hydronephrosis. Mildly distended urinary bladder without evidence of wall thickening. Stomach/Bowel: The stomach is within normal limits. Scattered diverticula are noted in the distal descending and sigmoid colon, and there is the suggestion of mild wall thickening of the proximal sigmoid colon with mild surrounding fat stranding. Scattered air-fluid levels are present in multiple small bowel loops without bowel dilatation suggestive of a significant mechanical obstruction. There is a question of mild wall thickening involving a few small bowel loops in the left mid to lower abdomen. Prior appendectomy. Vascular/Lymphatic: Abdominal aortic atherosclerosis without aneurysm. No enlarged lymph nodes. Reproductive: Multiple calcified uterine fibroids including a 6 cm anterior fibroid resulting in impression on the bladder. No adnexal mass. Other: No intraperitoneal free fluid. Mild nonspecific haziness throughout the mesenteric  fat. No abdominal wall hernia. Musculoskeletal: Lower lumbar disc bulging and moderate facet arthrosis. IMPRESSION: 1. Suspected mild acute diverticulitis involving the proximal sigmoid colon. 2. Scattered small bowel air-fluid levels and with questionable wall thickening of a few left-sided small bowel loops, query enteritis. 3. Bilateral lower lobe lung opacities compatible with pneumonia. 4. Slightly nodular contour of the liver which could reflect mild changes of cirrhosis. 5. Layering density in the gallbladder lumen may represent concentrated bile and/or small stones. 6. Fibroid uterus. Electronically Signed   By: Logan Bores M.D.   On: 07/31/2017 18:17   Dg Chest 2 View  Result Date: 07/31/2017 CLINICAL DATA:  Chest pain short of breath EXAM: CHEST - 2 VIEW COMPARISON:  03/22/2017 FINDINGS: Hypoventilation with bibasilar atelectasis/infiltrate. Negative for heart failure or effusion. AICD unchanged in position IMPRESSION: Hypoventilation with bibasilar atelectasis/infiltrate. Electronically Signed   By: Franchot Gallo M.D.   On: 07/31/2017 13:04    Procedures Procedures (including critical care time)  Medications Ordered in ED Medications  cefTRIAXone (ROCEPHIN) 2 g in sodium chloride 0.9 % 100 mL IVPB (2 g Intravenous New Bag/Given 07/31/17 1849)  metroNIDAZOLE (FLAGYL) IVPB 500 mg (has no administration in time range)  doxycycline (VIBRAMYCIN) 100 mg in sodium chloride 0.9 % 250 mL IVPB (has no administration in time range)  ondansetron (ZOFRAN) injection 4 mg (4 mg Intravenous Given 07/31/17 1542)  morphine 4 MG/ML injection 6 mg (6 mg Intravenous Given 07/31/17 1543)  morphine 4 MG/ML injection 6 mg (6 mg Intravenous Given 07/31/17 1848)  dicyclomine (BENTYL) injection 20 mg (20 mg Intramuscular Given 07/31/17 1849)     Initial Impression / Assessment and Plan / ED Course  I have reviewed the triage vital signs and the nursing notes.  Pertinent labs & imaging results that were  available during my care of the patient were reviewed by me and considered in my medical decision making (see chart for details).   72 year old female with past medical history of nonischemic cardia myopathy here with generalized abdominal pain, cough, and shortness of breath.  Initial plain films show bibasilar infiltrate.  Exam shows diffuse abdominal tenderness and distention.  CT scan subsequently obtained and shows diverticulitis and possible enteritis.  Patient also has bibasilar pneumonia.  Lab work shows moderate  leukocytosis but is otherwise at her baseline.  Troponin elevated but she denies any chest pain and I suspect this is demand.  EKG nonischemic.  Will treat with Rocephin and Flagyl for combined pneumonia and diverticulitis, with addition of doxycycline for atypical coverage.  Otherwise, will plan for admission.  Pain improving with IV morphine.    Final Clinical Impressions(s) / ED Diagnoses   Final diagnoses:  Diverticulitis  Pneumonia of both lower lobes due to infectious organism [facility])  Enteritis    ED Discharge Orders    None       Duffy Bruce, MD 07/31/17 [phone]

## 2017-07-31 NOTE — Progress Notes (Signed)
Pharmacy Antibiotic Note  Morgan Clay is a 72 y.o. female admitted on 07/31/2017 with intra-abdominal infection.  Pharmacy has been consulted for Unasyn dosing. CT scan shows diverticulitis and possible enteritis; bibasilar pneumonia.  Ceftriaxone, and Doxycycline x1 in ED. Afebrile WBC 11.9 SCr 2.19, CrCl ~ 20 ml/min   Plan: Unasyn 3g IV q12h Follow up renal fxn, culture results, and clinical course.   Height: 4\' 11"  (149.9 cm) Weight: 151 lb (68.5 kg) IBW/kg (Calculated) : 43.2  Temp (24hrs), Avg:98.2 F (36.8 C), Min:98.2 F (36.8 C), Max:98.2 F (36.8 C)  Recent Labs  Lab 07/31/17 1229 07/31/17 1546  WBC 11.9*  --   CREATININE 2.19*  --   LATICACIDVEN  --  0.90    Estimated Creatinine Clearance: 20.4 mL/min (A) (by C-G formula based on SCr of 2.19 mg/dL (H)).    No Known Allergies  Thank you for allowing pharmacy to be a part of this patient's care.  Gretta Arab PharmD, BCPS Pager [phone] 07/31/2017 7:22 PM

## 2017-08-01 ENCOUNTER — Encounter: Payer: Medicare Other | Admitting: *Deleted

## 2017-08-01 ENCOUNTER — Telehealth: Payer: Self-pay | Admitting: Cardiology

## 2017-08-01 LAB — PHOSPHORUS: Phosphorus: 3.4 mg/dL (ref 2.5–4.6)

## 2017-08-01 LAB — URINALYSIS, ROUTINE W REFLEX MICROSCOPIC
BILIRUBIN URINE: NEGATIVE
GLUCOSE, UA: NEGATIVE mg/dL
HGB URINE DIPSTICK: NEGATIVE
KETONES UR: NEGATIVE mg/dL
LEUKOCYTES UA: NEGATIVE
NITRITE: NEGATIVE
PH: 5 (ref 5.0–8.0)
PROTEIN: 100 mg/dL — AB
Specific Gravity, Urine: 1.02 (ref 1.005–1.030)

## 2017-08-01 LAB — BASIC METABOLIC PANEL
ANION GAP: 12 (ref 5–15)
BUN: 51 mg/dL — ABNORMAL HIGH (ref 6–20)
CALCIUM: 9 mg/dL (ref 8.9–10.3)
CO2: 21 mmol/L — ABNORMAL LOW (ref 22–32)
CREATININE: 2.12 mg/dL — AB (ref 0.44–1.00)
Chloride: 104 mmol/L (ref 101–111)
GFR calc non Af Amer: 23 mL/min — ABNORMAL LOW (ref >60)
GFR, EST AFRICAN AMERICAN: 26 mL/min — AB (ref >60)
Glucose, Bld: 112 mg/dL — ABNORMAL HIGH (ref 65–99)
Potassium: 4.4 mmol/L (ref 3.5–5.1)
SODIUM: 137 mmol/L (ref 135–145)

## 2017-08-01 LAB — GLUCOSE, CAPILLARY
GLUCOSE-CAPILLARY: 116 mg/dL — AB (ref 65–99)
GLUCOSE-CAPILLARY: 114 mg/dL — AB (ref 65–99)
Glucose-Capillary: 99 mg/dL (ref 65–99)

## 2017-08-01 LAB — MAGNESIUM: Magnesium: 2.1 mg/dL (ref 1.7–2.4)

## 2017-08-01 LAB — CBC
HCT: 40 % (ref 36.0–46.0)
HEMOGLOBIN: 13.3 g/dL (ref 12.0–15.0)
MCH: 29.7 pg (ref 26.0–34.0)
MCHC: 33.3 g/dL (ref 30.0–36.0)
MCV: 89.3 fL (ref 78.0–100.0)
PLATELETS: 174 10*3/uL (ref 150–400)
RBC: 4.48 MIL/uL (ref 3.87–5.11)
RDW: 15.5 % (ref 11.5–15.5)
WBC: 14.9 10*3/uL — AB (ref 4.0–10.5)

## 2017-08-01 LAB — TROPONIN I
TROPONIN I: 0.05 ng/mL — AB (ref <0.03)
Troponin I: 0.05 ng/mL (ref <0.03)

## 2017-08-01 LAB — NA AND K (SODIUM & POTASSIUM), RAND UR: POTASSIUM UR: 63 mmol/L

## 2017-08-01 LAB — PROTEIN / CREATININE RATIO, URINE
CREATININE, URINE: 162.82 mg/dL
PROTEIN CREATININE RATIO: 0.47 mg/mg{creat} — AB (ref 0.00–0.15)
TOTAL PROTEIN, URINE: 76 mg/dL

## 2017-08-01 MED ORDER — LIP MEDEX EX OINT
TOPICAL_OINTMENT | CUTANEOUS | Status: AC
  Administered 2017-08-01: 09:00:00
  Filled 2017-08-01: qty 7

## 2017-08-01 MED ORDER — HYDRALAZINE HCL 20 MG/ML IJ SOLN
10.0000 mg | Freq: Four times a day (QID) | INTRAMUSCULAR | Status: DC | PRN
  Administered 2017-08-08 – 2017-08-11 (×6): 10 mg via INTRAVENOUS
  Filled 2017-08-01 (×6): qty 1

## 2017-08-01 MED ORDER — MORPHINE SULFATE (PF) 2 MG/ML IV SOLN
2.0000 mg | INTRAVENOUS | Status: AC | PRN
  Administered 2017-08-01 (×2): 2 mg via INTRAVENOUS
  Filled 2017-08-01 (×2): qty 1

## 2017-08-01 MED ORDER — ONDANSETRON HCL 4 MG/2ML IJ SOLN
4.0000 mg | INTRAMUSCULAR | Status: DC | PRN
Start: 2017-08-01 — End: 2017-08-17
  Administered 2017-08-01 – 2017-08-02 (×3): 4 mg via INTRAVENOUS
  Filled 2017-08-01 (×4): qty 2

## 2017-08-01 MED ORDER — HYDROMORPHONE HCL 1 MG/ML IJ SOLN
1.0000 mg | INTRAMUSCULAR | Status: DC | PRN
  Administered 2017-08-01: 1 mg via INTRAVENOUS
  Filled 2017-08-01: qty 1
  Filled 2017-08-01: qty 2

## 2017-08-01 NOTE — Progress Notes (Signed)
Patient declines nocturnal CPAP tonight and denies the use at home. She further states she does not wish to have a machine brought to her at all during this hospital course. She is aware that if she should decide to try or be more comliant, she may contact her RN or RT for further assistance. Order discontinued per RT protocol.

## 2017-08-01 NOTE — Progress Notes (Signed)
PROGRESS NOTE    Morgan Clay  ZTI:[id number] DOB: April 09, 1946 DOA: 07/31/2017 PCP: [name], DO    Brief Narrative: Morgan Clay is a 72 y.o. female with medical history significant for NICM (last EF 45%) s/p CRT-D, A. fib on Eliquis with recent DCCV on 3/19, OSA on CPAP, HCV status post Harvoni (viremic response not documented), hypothyroidism, depression and diet managed type 2 diabetes mellitus who presents to the ED with 5 days of diffuse, generalized abdominal pain, with associated anorexia, nausea and one episode of profuse nonbloody, nonbilious emesis on the day of presentation.  CT abdomen and pelvis without contrast showed Suspected mild acute diverticulitis involving the proximal sigmoid colon. Scattered small bowel air-fluid levels and with questionable wall thickening of a few left-sided small bowel loops, query enteritis.  Bilateral lower lobe lung opacities compatible with pneumonia.    Assessment & Plan:   Active Problems:   Diverticulitis  Acute sigmoid diverticulitis and enteritis Start the patient on Unasyn.  Patient continues to have persistent lower abdominal pain so we will not de-escalate the antibiotics or R advance her diet.  Continue with liquid diet pain control antiemetics as needed and IV fluids.  If her abdominal pain does not improve we will get surgery consult in the morning for further evaluation.   Acute renal failure patient's baseline creatinine appears to be around 1.5 currently rates are about 2 suspect component of prerenal/dehydration.  Hold Entresto, Aldactone and Lasix for now avoid nephrotoxins.  Strict in and out and daily weights.  Continue to monitor creatinine.    Chronic systolic heart failure patient is slightly dehydrated we will hold the Lasix Aldactone and Entresto.  Resume the rest of the home medications as ordered Coreg.    Atrial fibrillation rate controlled on Eliquis   Hypothyroidism resume  Synthroid.   Obstructive sleep apnea on CPAP at night continue the same.   History of hepatitis C status post Harwani.  Outpatient follow-up with PCP..   Diabetes mellitus  CBG (last 3)  Recent Labs    07/31/17 2148 08/01/17 1201  GLUCAP 121* 99   Well-controlled.    DVT prophylaxis: (Eliquis Code Status: Full code Family Communication: No family at bedside Disposition Plan: Pending resolution of diverticulitis and enteritis.   Consultants:   None  Procedures: None  Antimicrobials: Unasyn   Subjective: Reports persistent lower abdominal pain  Objective: Vitals:   07/31/17 1815 07/31/17 2042 08/01/17 0535 08/01/17 1446  BP: (!) 141/65 (!) 144/59 140/62 (!) 164/65  Pulse: 72 66 63 68  Resp: 20 20 18 17   Temp:  99.5 F (37.5 C) 97.7 F (36.5 C) 97.7 F (36.5 C)  TempSrc:  Oral Oral Oral  SpO2: 100% 100% 100% 96%  Weight:      Height:        Intake/Output Summary (Last 24 hours) at 08/01/2017 1624 Last data filed at 08/01/2017 0535 Gross per 24 hour  Intake 100 ml  Output -  Net 100 ml   Filed Weights   07/31/17 1124  Weight: 68.5 kg (151 lb)    Examination:  General exam: Appears mild distress and uncomfortable from abdominal pain Respiratory system: Clear to auscultation. Respiratory effort normal. Cardiovascular system: S1 & S2 heard, RRR. No JVD, murmurs, rubs, gallops or clicks. No pedal edema. Gastrointestinal system: Abdomen is soft, mildly distended, generalized tenderness.  Bowel sounds adequate. Central nervous system: Alert and oriented. No focal neurological deficits. Extremities: Symmetric 5 x 5 power. Skin: No rashes, lesions or  ulcers Psychiatry:  Mood & affect appropriate.     Data Reviewed: I have personally reviewed following labs and imaging studies  CBC: Recent Labs  Lab 07/31/17 1229 08/01/17 0237  WBC 11.9* 14.9*  HGB 14.8 13.3  HCT 43.9 40.0  MCV 88.5 89.3  PLT 188 500   Basic Metabolic Panel: Recent Labs   Lab 07/31/17 1229 07/31/17 2115 08/01/17 0237  NA 136  --  137  K 4.4  --  4.4  CL 102  --  104  CO2 22  --  21*  GLUCOSE 140*  --  112*  BUN 54*  --  51*  CREATININE 2.19*  --  2.12*  CALCIUM 9.6  --  9.0  MG  --  2.0 2.1  PHOS  --  3.3 3.4   GFR: Estimated Creatinine Clearance: 21.1 mL/min (A) (by C-G formula based on SCr of 2.12 mg/dL (H)). Liver Function Tests: Recent Labs  Lab 07/31/17 1537  AST 16  ALT 13*  ALKPHOS 69  BILITOT 1.0  PROT 9.3*  ALBUMIN 4.6   Recent Labs  Lab 07/31/17 1537  LIPASE 25   No results for input(s): AMMONIA in the last 168 hours. Coagulation Profile: Recent Labs  Lab 07/31/17 1537  INR 1.37   Cardiac Enzymes: Recent Labs  Lab 07/31/17 1537 07/31/17 2115 08/01/17 0237 08/01/17 0715  TROPONINI 0.04* 0.05* 0.05* 0.05*   BNP (last 3 results) No results for input(s): PROBNP in the last 8760 hours. HbA1C: No results for input(s): HGBA1C in the last 72 hours. CBG: Recent Labs  Lab 07/31/17 2148 08/01/17 1201  GLUCAP 121* 99   Lipid Profile: No results for input(s): CHOL, HDL, LDLCALC, TRIG, CHOLHDL, LDLDIRECT in the last 72 hours. Thyroid Function Tests: No results for input(s): TSH, T4TOTAL, FREET4, T3FREE, THYROIDAB in the last 72 hours. Anemia Panel: No results for input(s): VITAMINB12, FOLATE, FERRITIN, TIBC, IRON, RETICCTPCT in the last 72 hours. Sepsis Labs: Recent Labs  Lab 07/31/17 1546  LATICACIDVEN 0.90    No results found for this or any previous visit (from the past 240 hour(s)).       Radiology Studies: Ct Abdomen Pelvis Wo Contrast  Result Date: 07/31/2017 CLINICAL DATA:  Abdominal pain, distention, nausea, and vomiting. EXAM: CT ABDOMEN AND PELVIS WITHOUT CONTRAST TECHNIQUE: Multidetector CT imaging of the abdomen and pelvis was performed following the standard protocol without IV contrast. COMPARISON:  Right upper quadrant abdominal ultrasound 02/23/2014. Renal ultrasound 10/01/2013. Abdominal  ultrasound 04/06/2011. FINDINGS: Lower chest: Patchy consolidation in both lower lobes, incompletely imaged. No pleural effusion. Partially visualized ICD leads and cardiomegaly. Hepatobiliary: Slightly nodular liver contour without focal abnormality identified on this unenhanced study. The layering hyperdense material in the gallbladder lumen without gross wall thickening or pericholecystic inflammation. No biliary dilatation. Pancreas: Unremarkable. Spleen: Unremarkable. Adrenals/Urinary Tract: Unremarkable adrenal glands. No evidence of renal mass, calculi, or hydronephrosis. Mildly distended urinary bladder without evidence of wall thickening. Stomach/Bowel: The stomach is within normal limits. Scattered diverticula are noted in the distal descending and sigmoid colon, and there is the suggestion of mild wall thickening of the proximal sigmoid colon with mild surrounding fat stranding. Scattered air-fluid levels are present in multiple small bowel loops without bowel dilatation suggestive of a significant mechanical obstruction. There is a question of mild wall thickening involving a few small bowel loops in the left mid to lower abdomen. Prior appendectomy. Vascular/Lymphatic: Abdominal aortic atherosclerosis without aneurysm. No enlarged lymph nodes. Reproductive: Multiple calcified uterine fibroids including a  6 cm anterior fibroid resulting in impression on the bladder. No adnexal mass. Other: No intraperitoneal free fluid. Mild nonspecific haziness throughout the mesenteric fat. No abdominal wall hernia. Musculoskeletal: Lower lumbar disc bulging and moderate facet arthrosis. IMPRESSION: 1. Suspected mild acute diverticulitis involving the proximal sigmoid colon. 2. Scattered small bowel air-fluid levels and with questionable wall thickening of a few left-sided small bowel loops, query enteritis. 3. Bilateral lower lobe lung opacities compatible with pneumonia. 4. Slightly nodular contour of the liver which  could reflect mild changes of cirrhosis. 5. Layering density in the gallbladder lumen may represent concentrated bile and/or small stones. 6. Fibroid uterus. Electronically Signed   By: Logan Bores M.D.   On: 07/31/2017 18:17   Dg Chest 2 View  Result Date: 07/31/2017 CLINICAL DATA:  Chest pain short of breath EXAM: CHEST - 2 VIEW COMPARISON:  03/22/2017 FINDINGS: Hypoventilation with bibasilar atelectasis/infiltrate. Negative for heart failure or effusion. AICD unchanged in position IMPRESSION: Hypoventilation with bibasilar atelectasis/infiltrate. Electronically Signed   By: Franchot Gallo M.D.   On: 07/31/2017 13:04        Scheduled Meds: . amiodarone  200 mg Oral Daily  . apixaban  5 mg Oral BID  . carvedilol  25 mg Oral BID WC  . [START ON 08/02/2017] furosemide  60 mg Oral Daily  . insulin aspart  0-5 Units Subcutaneous QHS  . insulin aspart  0-9 Units Subcutaneous TID WC  . isosorbide-hydrALAZINE  1 tablet Oral BID  . levothyroxine  125 mcg Oral QAC breakfast  . sertraline  50 mg Oral Daily   Continuous Infusions: . ampicillin-sulbactam (UNASYN) IV Stopped (08/01/17 1117)     LOS: 1 day    Time spent: 35 minutes.     Hosie Poisson, MD [facility] Pager [phone]  If 7PM-7AM, please contact night-coverage [email or web address] Password [email or web address] 08/01/2017, 4:24 PM

## 2017-08-01 NOTE — Telephone Encounter (Signed)
LMOVM reminding pt to send remote transmission.   

## 2017-08-02 LAB — BASIC METABOLIC PANEL
Anion gap: 16 — ABNORMAL HIGH (ref 5–15)
BUN: 68 mg/dL — AB (ref 6–20)
CHLORIDE: 104 mmol/L (ref 101–111)
CO2: 19 mmol/L — AB (ref 22–32)
Calcium: 9.3 mg/dL (ref 8.9–10.3)
Creatinine, Ser: 2.51 mg/dL — ABNORMAL HIGH (ref 0.44–1.00)
GFR calc non Af Amer: 18 mL/min — ABNORMAL LOW (ref >60)
GFR, EST AFRICAN AMERICAN: 21 mL/min — AB (ref >60)
Glucose, Bld: 120 mg/dL — ABNORMAL HIGH (ref 65–99)
POTASSIUM: 4.2 mmol/L (ref 3.5–5.1)
Sodium: 139 mmol/L (ref 135–145)

## 2017-08-02 LAB — GLUCOSE, CAPILLARY
GLUCOSE-CAPILLARY: 113 mg/dL — AB (ref 65–99)
Glucose-Capillary: 140 mg/dL — ABNORMAL HIGH (ref 65–99)
Glucose-Capillary: 109 mg/dL — ABNORMAL HIGH (ref 65–99)
Glucose-Capillary: 112 mg/dL — ABNORMAL HIGH (ref 65–99)

## 2017-08-02 LAB — CALCIUM / CREATININE RATIO, URINE
CREATININE, UR: 134.3 mg/dL
Calcium/Creat.Ratio: <6 mg/g creat (ref 0–260)

## 2017-08-02 MED ORDER — SODIUM CHLORIDE 0.9 % IV SOLN
INTRAVENOUS | Status: AC
  Administered 2017-08-02 – 2017-08-05 (×5): via INTRAVENOUS

## 2017-08-02 MED ORDER — PIPERACILLIN-TAZOBACTAM IN DEX 2-0.25 GM/50ML IV SOLN
2.2500 g | Freq: Four times a day (QID) | INTRAVENOUS | Status: DC
  Administered 2017-08-03 – 2017-08-07 (×19): 2.25 g via INTRAVENOUS
  Filled 2017-08-02 (×20): qty 50

## 2017-08-02 MED ORDER — PANTOPRAZOLE SODIUM 40 MG IV SOLR
40.0000 mg | INTRAVENOUS | Status: DC
  Administered 2017-08-02 – 2017-08-12 (×11): 40 mg via INTRAVENOUS
  Filled 2017-08-02 (×11): qty 40

## 2017-08-02 MED ORDER — PROMETHAZINE HCL 25 MG/ML IJ SOLN: 12.5000 mg | Freq: Four times a day (QID) | INTRAMUSCULAR | Status: DC | PRN

## 2017-08-02 MED ORDER — MORPHINE SULFATE (PF) 2 MG/ML IV SOLN
1.0000 mg | INTRAVENOUS | Status: DC | PRN
  Administered 2017-08-02 – 2017-08-03 (×3): 2 mg via INTRAVENOUS
  Filled 2017-08-02 (×3): qty 1

## 2017-08-02 NOTE — Progress Notes (Signed)
PROGRESS NOTE    Morgan Clay  TML:[id number] DOB: 26-Sep-1945 DOA: 07/31/2017 PCP: [name], DO    Brief Narrative: Morgan Clay is a 72 y.o. female with medical history significant for NICM (last EF 45%) s/p CRT-D, A. fib on Eliquis with recent DCCV on 3/19, OSA on CPAP, HCV status post Harvoni (viremic response not documented), hypothyroidism, depression and diet managed type 2 diabetes mellitus who presents to the ED with 5 days of diffuse, generalized abdominal pain, with associated anorexia, nausea and one episode of profuse nonbloody, nonbilious emesis on the day of presentation.  CT abdomen and pelvis without contrast showed Suspected mild acute diverticulitis involving the proximal sigmoid colon. Scattered small bowel air-fluid levels and with questionable wall thickening of a few left-sided small bowel loops, query enteritis.  Bilateral lower lobe lung opacities compatible with pneumonia.    Assessment & Plan:   Active Problems:   Diverticulitis  Acute sigmoid diverticulitis and enteritis Was initially on unasyn, changed to zosyn today.  Patient continues to have persistent lower abdominal pain so we will not de-escalate the antibiotics or  advance her diet.  Continue with liquid diet pain control antiemetics as needed and IV fluids. Surgery consulted for persistent abd pain, nausea, and vomiting. Add protonix IV .   Acute renal failure patient's baseline creatinine appears to be around 1.5 currently rates are about 2 suspect component of prerenal/dehydration.  Hold Entresto, Aldactone and Lasix for now avoid nephrotoxins.  Strict in and out and daily weights.  Continue to monitor creatinine. Started her on IV fluids NS 140ml/ hr and recheck creatinine in am. UA is negative for infection.    Chronic systolic heart failure patient is slightly dehydrated we will hold the Lasix Aldactone and Entresto.  Resume the rest of the home medications as ordered, Coreg.  Elevated  troponin's  Sec to demand ischemia. She denies any chest pain.   Atrial fibrillation rate controlled on Eliquis   Hypothyroidism resume Synthroid.   Obstructive sleep apnea on CPAP at night continue the same.   History of hepatitis C status post Harwani.  Outpatient follow-up with PCP..   Diabetes mellitus  CBG (last 3)  Recent Labs    08/01/17 1710 08/01/17 2102 08/02/17 0758  GLUCAP 116* 114* 113*   Well-controlled. Diet controlled. Resume SSI.     DVT prophylaxis: (Eliquis Code Status: Full code Family Communication: No family at bedside Disposition Plan: Pending resolution of diverticulitis and enteritis.   Consultants:   Surgery.   Procedures: None  Antimicrobials: zosyn.   Subjective: Reports persistent lower abdominal pain with nausea,   Objective: Vitals:   08/01/17 2108 08/01/17 2242 08/02/17 0527 08/02/17 0830  BP: (!) 187/74 (!) 163/63 (!) 122/57 (!) 155/57  Pulse: 68  66 60  Resp: 20  20 19   Temp: 98.3 F (36.8 C)  97.9 F (36.6 C) (!) 97.5 F (36.4 C)  TempSrc: Oral  Oral Oral  SpO2: 93%  94% 97%  Weight:   68.9 kg (151 lb 14.4 oz)   Height:        Intake/Output Summary (Last 24 hours) at 08/02/2017 1008 Last data filed at 08/02/2017 0835 Gross per 24 hour  Intake 250 ml  Output 400 ml  Net -150 ml   Filed Weights   07/31/17 1124 08/02/17 0527  Weight: 68.5 kg (151 lb) 68.9 kg (151 lb 14.4 oz)    Examination:  General exam: Appears in distress from pain .  Respiratory system: Clear to auscultation.  Respiratory effort normal. No wheezing or rhonchi.  Cardiovascular system: S1 & S2 heard, RRR. No JVD, murmurs . No pedal edema. Gastrointestinal system: Abdomen is soft, mildly distended, generalized tenderness.  Bowel sounds good.  Central nervous system: Alert and oriented. No focal neurological deficits. Extremities: Symmetric 5 x 5 power. Skin: No rashes, lesions or ulcers Psychiatry:  Mood & affect appropriate.      Data Reviewed: I have personally reviewed following labs and imaging studies  CBC: Recent Labs  Lab 07/31/17 1229 08/01/17 0237  WBC 11.9* 14.9*  HGB 14.8 13.3  HCT 43.9 40.0  MCV 88.5 89.3  PLT 188 782   Basic Metabolic Panel: Recent Labs  Lab 07/31/17 1229 07/31/17 2115 08/01/17 0237  NA 136  --  137  K 4.4  --  4.4  CL 102  --  104  CO2 22  --  21*  GLUCOSE 140*  --  112*  BUN 54*  --  51*  CREATININE 2.19*  --  2.12*  CALCIUM 9.6  --  9.0  MG  --  2.0 2.1  PHOS  --  3.3 3.4   GFR: Estimated Creatinine Clearance: 21.2 mL/min (A) (by C-G formula based on SCr of 2.12 mg/dL (H)). Liver Function Tests: Recent Labs  Lab 07/31/17 1537  AST 16  ALT 13*  ALKPHOS 69  BILITOT 1.0  PROT 9.3*  ALBUMIN 4.6   Recent Labs  Lab 07/31/17 1537  LIPASE 25   No results for input(s): AMMONIA in the last 168 hours. Coagulation Profile: Recent Labs  Lab 07/31/17 1537  INR 1.37   Cardiac Enzymes: Recent Labs  Lab 07/31/17 1537 07/31/17 2115 08/01/17 0237 08/01/17 0715  TROPONINI 0.04* 0.05* 0.05* 0.05*   BNP (last 3 results) No results for input(s): PROBNP in the last 8760 hours. HbA1C: No results for input(s): HGBA1C in the last 72 hours. CBG: Recent Labs  Lab 07/31/17 2148 08/01/17 1201 08/01/17 1710 08/01/17 2102 08/02/17 0758  GLUCAP 121* 99 116* 114* 113*   Lipid Profile: No results for input(s): CHOL, HDL, LDLCALC, TRIG, CHOLHDL, LDLDIRECT in the last 72 hours. Thyroid Function Tests: No results for input(s): TSH, T4TOTAL, FREET4, T3FREE, THYROIDAB in the last 72 hours. Anemia Panel: No results for input(s): VITAMINB12, FOLATE, FERRITIN, TIBC, IRON, RETICCTPCT in the last 72 hours. Sepsis Labs: Recent Labs  Lab 07/31/17 1546  LATICACIDVEN 0.90    No results found for this or any previous visit (from the past 240 hour(s)).       Radiology Studies: Ct Abdomen Pelvis Wo Contrast  Result Date: 07/31/2017 CLINICAL DATA:   Abdominal pain, distention, nausea, and vomiting. EXAM: CT ABDOMEN AND PELVIS WITHOUT CONTRAST TECHNIQUE: Multidetector CT imaging of the abdomen and pelvis was performed following the standard protocol without IV contrast. COMPARISON:  Right upper quadrant abdominal ultrasound 02/23/2014. Renal ultrasound 10/01/2013. Abdominal ultrasound 04/06/2011. FINDINGS: Lower chest: Patchy consolidation in both lower lobes, incompletely imaged. No pleural effusion. Partially visualized ICD leads and cardiomegaly. Hepatobiliary: Slightly nodular liver contour without focal abnormality identified on this unenhanced study. The layering hyperdense material in the gallbladder lumen without gross wall thickening or pericholecystic inflammation. No biliary dilatation. Pancreas: Unremarkable. Spleen: Unremarkable. Adrenals/Urinary Tract: Unremarkable adrenal glands. No evidence of renal mass, calculi, or hydronephrosis. Mildly distended urinary bladder without evidence of wall thickening. Stomach/Bowel: The stomach is within normal limits. Scattered diverticula are noted in the distal descending and sigmoid colon, and there is the suggestion of mild wall thickening of the proximal  sigmoid colon with mild surrounding fat stranding. Scattered air-fluid levels are present in multiple small bowel loops without bowel dilatation suggestive of a significant mechanical obstruction. There is a question of mild wall thickening involving a few small bowel loops in the left mid to lower abdomen. Prior appendectomy. Vascular/Lymphatic: Abdominal aortic atherosclerosis without aneurysm. No enlarged lymph nodes. Reproductive: Multiple calcified uterine fibroids including a 6 cm anterior fibroid resulting in impression on the bladder. No adnexal mass. Other: No intraperitoneal free fluid. Mild nonspecific haziness throughout the mesenteric fat. No abdominal wall hernia. Musculoskeletal: Lower lumbar disc bulging and moderate facet arthrosis.  IMPRESSION: 1. Suspected mild acute diverticulitis involving the proximal sigmoid colon. 2. Scattered small bowel air-fluid levels and with questionable wall thickening of a few left-sided small bowel loops, query enteritis. 3. Bilateral lower lobe lung opacities compatible with pneumonia. 4. Slightly nodular contour of the liver which could reflect mild changes of cirrhosis. 5. Layering density in the gallbladder lumen may represent concentrated bile and/or small stones. 6. Fibroid uterus. Electronically Signed   By: Logan Bores M.D.   On: 07/31/2017 18:17   Dg Chest 2 View  Result Date: 07/31/2017 CLINICAL DATA:  Chest pain short of breath EXAM: CHEST - 2 VIEW COMPARISON:  03/22/2017 FINDINGS: Hypoventilation with bibasilar atelectasis/infiltrate. Negative for heart failure or effusion. AICD unchanged in position IMPRESSION: Hypoventilation with bibasilar atelectasis/infiltrate. Electronically Signed   By: Franchot Gallo M.D.   On: 07/31/2017 13:04        Scheduled Meds: . amiodarone  200 mg Oral Daily  . apixaban  5 mg Oral BID  . carvedilol  25 mg Oral BID WC  . insulin aspart  0-5 Units Subcutaneous QHS  . insulin aspart  0-9 Units Subcutaneous TID WC  . isosorbide-hydrALAZINE  1 tablet Oral BID  . levothyroxine  125 mcg Oral QAC breakfast  . pantoprazole (PROTONIX) IV  40 mg Intravenous Q24H  . sertraline  50 mg Oral Daily   Continuous Infusions: . ampicillin-sulbactam (UNASYN) IV 3 g (08/02/17 0955)     LOS: 2 days    Time spent: 35 minutes.     Hosie Poisson, MD [facility] Pager [phone]  If 7PM-7AM, please contact night-coverage [email or web address] Password [email or web address] 08/02/2017, 10:08 AM

## 2017-08-02 NOTE — Consult Note (Signed)
[facility] Surgery Consult Note  Morgan Clay 09/09/1945  [id number].    Requesting MD: Karleen Hampshire, MD Chief Complaint/Reason for Consult: acute diverticulitis   HPI:  Morgan Clay is a 72 y/o female with a PMH including, but not limited to, cardiomyopathy s/p pacemaker, a.fib on eliquis (recent cardioversion 3/19), OSA, HCV, and DM who presented to [facility] with 5 days of abdominal pain. Patient states she started having sharp, lower, non-radiating abdominal pain on Sunday 3/34.  described as "labor pains" that didn't improve with gas medication or activity. She reports similar pain many years ago when she had appendicitis. She reports associated nausea and vomiting. States the past few days her stools have been white in color and "gel like", less formed. Denies urinary sxs. Denies previous diagnosis of diverticulitis or inflammatory bowel disease. Denies sick contacts or recent travel. States her last colonoscopy was 9 years ago and was normal. Denies tobacco or illicit drugs use. Drinks 2 glasses of wine about 3 nights per week. NKDA. Has a history of tubal ligation and appendectomy.   ED workup significant for leukocytosis 11,900, Creatinine 2.19, and CT abd/pelvis showing possible diverticulitis of the proximal sigmoid colon, some small bowel air fluid levels in the left hemiabdomen, BL lower lobe PNA, and nodularities of the liver. LFTs and lipase WNL.   ROS: Review of Systems  Constitutional: Negative for chills and fever.  Respiratory: Negative.   Cardiovascular: Negative.   Gastrointestinal: Positive for abdominal pain, diarrhea, nausea and vomiting. Negative for blood in stool and melena.  Genitourinary: Negative.   All other systems reviewed and are negative.   Family History  Problem Relation Age of Onset  . Asthma Father   . Heart attack Father   . Asthma Sister   . Cancer Sister        lung  . Heart attack Mother   . Stroke Brother   . Diabetes Unknown     Past  Medical History:  Diagnosis Date  . Arthritis   . Asthma    reports mild asthma since childhood - had COPD on dx list from prior PCP  . Atypical atrial flutter ([place]) 09/28/2015  . Chronic systolic congestive heart failure ([place])   . DEPRESSION 12/17/2009   Annotation: PHQ-9 score = 14 done on 12/17/2009 Qualifier: Diagnosis of  By: Hassell Done FNP, Tori Milks    . FIBROIDS, UTERUS 03/05/2008  . Glaucoma   . HEPATITIS C - s/p treatment with Harvoni, seeing hepatology, Dawn Drazek 01/04/2007  . Hyperlipemia 12/06/2012  . HYPERTENSION, BENIGN 04/24/2007  . Hypothyroidism   . LBBB (left bundle branch block)   . Nonischemic cardiomyopathy ([place])   . OBESITY 05/27/2009  . OBSTRUCTIVE SLEEP APNEA 11/14/2007   npsg 2009:  Mild osa with AHI 11/hr. Started cpap 2009, quit using due to lack of response Home sleep testing 09/2010 with AHI only 7/hr and weight loss advised by pulmonology.  . OSTEOPENIA 09/30/2008  . Personal history of other infectious and parasitic disease    Hepatitis B    Past Surgical History:  Procedure Laterality Date  . appendectomy    . CARDIAC CATHETERIZATION    . CARDIOVERSION N/A 12/14/2014   Procedure: CARDIOVERSION;  Surgeon: Dorothy Spark, MD;  Location: [facility] ENDOSCOPY;  Service: Cardiovascular;  Laterality: N/A;  . CARDIOVERSION N/A 02/26/2017   Procedure: CARDIOVERSION;  Surgeon: Evans Lance, MD;  Location: [place] CV LAB;  Service: Cardiovascular;  Laterality: N/A;  . CARDIOVERSION N/A 07/24/2017   Procedure: CARDIOVERSION;  Surgeon: Acie Fredrickson,  Wonda Cheng, MD;  Location: [facility] ENDOSCOPY;  Service: Cardiovascular;  Laterality: N/A;  . EP IMPLANTABLE DEVICE N/A 10/06/2015   Procedure: BIV ICD Generator Changeout;  Surgeon: [name], MD;  Location: [place] [facility] LAB;  Service: Cardiovascular;  Laterality: N/A;    Social History:  reports that she has never smoked. She has never used smokeless tobacco. She reports that she drinks alcohol. She reports that she does not use  drugs.  Allergies: No Known Allergies  Medications Prior to Admission  Medication Sig Dispense Refill  . amiodarone (PACERONE) 200 MG tablet 2/20-2/26 take 2 tablets, 474m twice/day 2/27-3/19- take 1 tablet, 2051mtwice/day March 20- one tablet, 20063maily (Patient taking differently: Take 200 mg by mouth 2 (two) times daily. 2/20-2/26 take 2 tablets, 400m52mice/day 2/27-3/19- take 1 tablet, 200mg62mce/day March 20- one tablet, 200mg 17my) 90 tablet 3  . BIDIL 20-37.5 MG tablet TAKE 1 TABLET BY MOUTH TWICE DAILY (Patient taking differently: TAKE 1 TABLET (37.5 mg) BY MOUTH TWICE DAILY) 180 tablet 3  . carvedilol (COREG) 25 MG tablet Take 1 tablet (25 mg total) by mouth 2 (two) times daily. 180 tablet 3  . ELIQUIS 5 MG TABS tablet TAKE ONE TABLET BY MOUTH TWICE DAILY 180 tablet 3  . ENTRESTO 97-103 MG TAKE 1 TABLET BY MOUTH TWICE DAILY **TITRATED  DOSE  UP** 60 tablet 10  . furosemide (LASIX) 40 MG tablet Take 60 mg by mouth daily.    . levoMarland Kitchenhyroxine (SYNTHROID, LEVOTHROID) 125 MCG tablet Take 1 tablet (125 mcg total) by mouth daily. (Patient taking differently: Take 125 mcg by mouth daily before breakfast. ) 90 tablet 0  . sertraline (ZOLOFT) 50 MG tablet Take 1 tablet (50 mg total) by mouth daily. 30 tablet 3  . spironolactone (ALDACTONE) 25 MG tablet Take 1 tablet (25 mg total) daily by mouth. 90 tablet 3  . Tetrahydrozoline HCl (VISINE OP) Place 1 drop into both eyes daily as needed (DRY EYES).    . traZODone (DESYREL) 50 MG tablet Take 1 tablet (50 mg total) by mouth at bedtime. (Patient taking differently: Take 50 mg by mouth at bedtime as needed for sleep. ) 90 tablet 3  . albuterol (PROVENTIL HFA;VENTOLIN HFA) 108 (90 BASE) MCG/ACT inhaler Inhale 2 puffs into the lungs every 6 (six) hours as needed for wheezing or shortness of breath. (Patient not taking: Reported on 07/31/2017) 1 Inhaler 3  . fluticasone (FLONASE) 50 MCG/ACT nasal spray Place 2 sprays into both nostrils daily. (Patient  not taking: Reported on 07/31/2017) 16 g 6  . nitroGLYCERIN (NITROSTAT) 0.4 MG SL tablet Place 0.4 mg under the tongue every 5 (five) minutes as needed for chest pain.      Blood pressure (!) 155/57, pulse 60, temperature (!) 97.5 F (36.4 C), temperature source Oral, resp. rate 19, height _0  (1.499 m), weight 68.9 kg (151 lb 14.4 oz), SpO2 97 %. Physical Exam: Physical Exam  Constitutional: She is oriented to person, place, and time. She appears well-developed and well-nourished.  Non-toxic appearance. She does not appear ill.  HENT:  Head: Normocephalic and atraumatic.  Eyes: EOM are normal. No scleral icterus.  Cardiovascular: Normal rate and regular rhythm. Exam reveals no gallop.  No murmur heard. Pulmonary/Chest: Effort normal. No stridor. No respiratory distress. She has no rhonchi. She has no rales.  Diminished breath sounds bilateral lung bases.  Abdominal: Soft. Normal appearance and bowel sounds are normal. There is tenderness in the right lower quadrant, suprapubic  area and left lower quadrant. There is no rebound and no guarding.  Neurological: She is alert and oriented to person, place, and time.  Skin: Skin is warm and dry. No rash noted. No pallor.  Psychiatric: She has a normal mood and affect. Her behavior is normal.    Results for orders placed or performed during the hospital encounter of 07/31/17 (from the past 48 hour(s))  Hepatic function panel     Status: Abnormal   Collection Time: 07/31/17  3:37 PM  Result Value Ref Range   Total Protein 9.3 (H) 6.5 - 8.1 g/dL   Albumin 4.6 3.5 - 5.0 g/dL   AST 16 15 - 41 U/L   ALT 13 (L) 14 - 54 U/L   Alkaline Phosphatase 69 38 - 126 U/L   Total Bilirubin 1.0 0.3 - 1.2 mg/dL   Bilirubin, Direct 0.2 0.1 - 0.5 mg/dL   Indirect Bilirubin 0.8 0.3 - 0.9 mg/dL    Comment: Performed at [facility], [place] [address] [place], [place] [address]  Troponin I     Status: Abnormal   Collection Time: 07/31/17   3:37 PM  Result Value Ref Range   Troponin I 0.04 (HH) <0.03 ng/mL    Comment: CRITICAL RESULT CALLED TO, READ BACK BY AND VERIFIED WITH: WEST,S @ 1630 ON [id number] BY POTEAT,S Performed at [place] [address], [place], [place] [address]   Lipase, blood     Status: None   Collection Time: 07/31/17  3:37 PM  Result Value Ref Range   Lipase 25 11 - 51 U/L    Comment: Performed at [facility], [place] [address], [place], [place] [address]  Protime-INR     Status: Abnormal   Collection Time: 07/31/17  3:37 PM  Result Value Ref Range   Prothrombin Time 16.8 (H) 11.4 - 15.2 seconds   INR 1.37     Comment: Performed at [facility], [place] [address] [place], [place] [address]  I-Stat CG4 Lactic Acid, ED     Status: None   Collection Time: 07/31/17  3:46 PM  Result Value Ref Range   Lactic Acid, Venous 0.90 0.5 - 1.9 mmol/L  Magnesium     Status: None   Collection Time: 07/31/17  9:15 PM  Result Value Ref Range   Magnesium 2.0 1.7 - 2.4 mg/dL    Comment: Performed at [facility], [place] [address] [place], [place] [address]  Phosphorus     Status: None   Collection Time: 07/31/17  9:15 PM  Result Value Ref Range   Phosphorus 3.3 2.5 - 4.6 mg/dL    Comment: Performed at [facility], [place] [address], [place], [place] [address]  Troponin I     Status: Abnormal   Collection Time: 07/31/17  9:15 PM  Result Value Ref Range   Troponin I 0.05 (HH) <0.03 ng/mL    Comment: CRITICAL VALUE NOTED.  VALUE IS CONSISTENT WITH PREVIOUSLY REPORTED AND CALLED VALUE. Performed at [facility], [place] [address], [place], [place] [address]   Glucose, capillary     Status: Abnormal   Collection Time: 07/31/17  9:48 PM  Result Value Ref Range   Glucose-Capillary 121 (H) 65 - 99 mg/dL   Comment 1 Notify RN    Comment 2 Document in Chart   Troponin I     Status: Abnormal   Collection Time:  08/01/17  2:37 AM  Result  Value Ref Range   Troponin I 0.05 (HH) <0.03 ng/mL    Comment: CRITICAL VALUE NOTED.  VALUE IS CONSISTENT WITH PREVIOUSLY REPORTED AND CALLED VALUE. Performed at [facility], [place] [address], [place], [place] [address]   Basic metabolic panel     Status: Abnormal   Collection Time: 08/01/17  2:37 AM  Result Value Ref Range   Sodium 137 135 - 145 mmol/L   Potassium 4.4 3.5 - 5.1 mmol/L   Chloride 104 101 - 111 mmol/L   CO2 21 (L) 22 - 32 mmol/L   Glucose, Bld 112 (H) 65 - 99 mg/dL   BUN 51 (H) 6 - 20 mg/dL   Creatinine, Ser 2.12 (H) 0.44 - 1.00 mg/dL   Calcium 9.0 8.9 - 10.3 mg/dL   GFR calc non Af Amer 23 (L) >60 mL/min   GFR calc Af Amer 26 (L) >60 mL/min    Comment: (NOTE) The eGFR has been calculated using the CKD EPI equation. This calculation has not been validated in all clinical situations. eGFR's persistently <60 mL/min signify possible Chronic Kidney Disease.    Anion gap 12 5 - 15    Comment: Performed at [facility], [place] [address], [place], [place] [address]  CBC     Status: Abnormal   Collection Time: 08/01/17  2:37 AM  Result Value Ref Range   WBC 14.9 (H) 4.0 - 10.5 K/uL   RBC 4.48 3.87 - 5.11 MIL/uL   Hemoglobin 13.3 12.0 - 15.0 g/dL   HCT 40.0 36.0 - 46.0 %   MCV 89.3 78.0 - 100.0 fL   MCH 29.7 26.0 - 34.0 pg   MCHC 33.3 30.0 - 36.0 g/dL   RDW 15.5 11.5 - 15.5 %   Platelets 174 150 - 400 K/uL    Comment: Performed at [facility], [place] [address], [place], [place] [address]  Magnesium     Status: None   Collection Time: 08/01/17  2:37 AM  Result Value Ref Range   Magnesium 2.1 1.7 - 2.4 mg/dL    Comment: Performed at [facility], [place] [address], [place], [place] [address]  Phosphorus     Status: None   Collection Time: 08/01/17  2:37 AM  Result Value Ref Range   Phosphorus 3.4 2.5 - 4.6 mg/dL    Comment: Performed at [facility],  [place] [address] [place], [place] [address]  Troponin I     Status: Abnormal   Collection Time: 08/01/17  7:15 AM  Result Value Ref Range   Troponin I 0.05 (HH) <0.03 ng/mL    Comment: CRITICAL VALUE NOTED.  VALUE IS CONSISTENT WITH PREVIOUSLY REPORTED AND CALLED VALUE. Performed at [facility], [place] [address], [place],  [address]   Urinalysis, Routine w reflex microscopic     Status: Abnormal   Collection Time: 08/01/17  9:52 AM  Result Value Ref Range   Color, Urine YELLOW YELLOW   APPearance CLEAR CLEAR   Specific Gravity, Urine 1.020 1.005 - 1.030   pH 5.0 5.0 - 8.0   Glucose, UA NEGATIVE NEGATIVE mg/dL   Hgb urine dipstick NEGATIVE NEGATIVE   Bilirubin Urine NEGATIVE NEGATIVE   Ketones, ur NEGATIVE NEGATIVE mg/dL   Protein, ur 100 (A) NEGATIVE mg/dL   Nitrite NEGATIVE NEGATIVE   Leukocytes, UA NEGATIVE NEGATIVE   RBC / HPF 6-30 0 - 5 RBC/hpf   WBC, UA 0-5 0 - 5 WBC/hpf  Bacteria, UA RARE (A) NONE SEEN   Squamous Epithelial / LPF 0-5 (A) NONE SEEN   Mucus PRESENT     Comment: Performed at [facility], [place] [address] [place], [place] [address]  Protein / creatinine ratio, urine     Status: Abnormal   Collection Time: 08/01/17  9:52 AM  Result Value Ref Range   Creatinine, Urine 162.82 mg/dL   Total Protein, Urine 76 mg/dL    Comment: NO NORMAL RANGE ESTABLISHED FOR THIS TEST   Protein Creatinine Ratio 0.47 (H) 0.00 - 0.15 mg/mg[Cre]    Comment: Performed at [facility], [place] [address] [place], [place] [address]  Na and K (sodium & potassium), rand urine     Status: None   Collection Time: 08/01/17  9:52 AM  Result Value Ref Range   Sodium, Ur <10 mmol/L   Potassium Urine 63 mmol/L    Comment: Performed at [facility], [place] [address] [place], [place] [address]  Glucose, capillary     Status: None   Collection Time: 08/01/17 12:01 PM  Result Value Ref Range   Glucose-Capillary 99  65 - 99 mg/dL   Comment 1 Notify RN    Comment 2 Document in Chart   Glucose, capillary     Status: Abnormal   Collection Time: 08/01/17  5:10 PM  Result Value Ref Range   Glucose-Capillary 116 (H) 65 - 99 mg/dL   Comment 1 Notify RN    Comment 2 Document in Chart   Glucose, capillary     Status: Abnormal   Collection Time: 08/01/17  9:02 PM  Result Value Ref Range   Glucose-Capillary 114 (H) 65 - 99 mg/dL   Comment 1 Notify RN   Glucose, capillary     Status: Abnormal   Collection Time: 08/02/17  7:58 AM  Result Value Ref Range   Glucose-Capillary 113 (H) 65 - 99 mg/dL   Comment 1 Notify RN    Comment 2 Document in Chart   Basic metabolic panel     Status: Abnormal   Collection Time: 08/02/17  9:44 AM  Result Value Ref Range   Sodium 139 135 - 145 mmol/L   Potassium 4.2 3.5 - 5.1 mmol/L   Chloride 104 101 - 111 mmol/L   CO2 19 (L) 22 - 32 mmol/L   Glucose, Bld 120 (H) 65 - 99 mg/dL   BUN 68 (H) 6 - 20 mg/dL   Creatinine, Ser 2.51 (H) 0.44 - 1.00 mg/dL   Calcium 9.3 8.9 - 10.3 mg/dL   GFR calc non Af Amer 18 (L) >60 mL/min   GFR calc Af Amer 21 (L) >60 mL/min    Comment: (NOTE) The eGFR has been calculated using the CKD EPI equation. This calculation has not been validated in all clinical situations. eGFR's persistently <60 mL/min signify possible Chronic Kidney Disease.    Anion gap 16 (H) 5 - 15    Comment: Performed at [facility], [place] [address] [place], [place] [address]  Glucose, capillary     Status: Abnormal   Collection Time: 08/02/17 12:22 PM  Result Value Ref Range   Glucose-Capillary 140 (H) 65 - 99 mg/dL   Comment 1 Notify RN    Comment 2 Document in Chart    Ct Abdomen Pelvis Wo Contrast  Result Date: 07/31/2017 CLINICAL DATA:  Abdominal pain, distention, nausea, and vomiting. EXAM: CT ABDOMEN AND PELVIS WITHOUT CONTRAST TECHNIQUE: Multidetector CT imaging of the  abdomen and pelvis was performed following the standard protocol  without IV contrast. COMPARISON:  Right upper quadrant abdominal ultrasound 02/23/2014. Renal ultrasound 10/01/2013. Abdominal ultrasound 04/06/2011. FINDINGS: Lower chest: Patchy consolidation in both lower lobes, incompletely imaged. No pleural effusion. Partially visualized ICD leads and cardiomegaly. Hepatobiliary: Slightly nodular liver contour without focal abnormality identified on this unenhanced study. The layering hyperdense material in the gallbladder lumen without gross wall thickening or pericholecystic inflammation. No biliary dilatation. Pancreas: Unremarkable. Spleen: Unremarkable. Adrenals/Urinary Tract: Unremarkable adrenal glands. No evidence of renal mass, calculi, or hydronephrosis. Mildly distended urinary bladder without evidence of wall thickening. Stomach/Bowel: The stomach is within normal limits. Scattered diverticula are noted in the distal descending and sigmoid colon, and there is the suggestion of mild wall thickening of the proximal sigmoid colon with mild surrounding fat stranding. Scattered air-fluid levels are present in multiple small bowel loops without bowel dilatation suggestive of a significant mechanical obstruction. There is a question of mild wall thickening involving a few small bowel loops in the left mid to lower abdomen. Prior appendectomy. Vascular/Lymphatic: Abdominal aortic atherosclerosis without aneurysm. No enlarged lymph nodes. Reproductive: Multiple calcified uterine fibroids including a 6 cm anterior fibroid resulting in impression on the bladder. No adnexal mass. Other: No intraperitoneal free fluid. Mild nonspecific haziness throughout the mesenteric fat. No abdominal wall hernia. Musculoskeletal: Lower lumbar disc bulging and moderate facet arthrosis. IMPRESSION: 1. Suspected mild acute diverticulitis involving the proximal sigmoid colon. 2. Scattered small bowel air-fluid levels and with questionable wall thickening of a few left-sided small bowel loops,  query enteritis. 3. Bilateral lower lobe lung opacities compatible with pneumonia. 4. Slightly nodular contour of the liver which could reflect mild changes of cirrhosis. 5. Layering density in the gallbladder lumen may represent concentrated bile and/or small stones. 6. Fibroid uterus. Electronically Signed   By: Logan Bores M.D.   On: 07/31/2017 18:17   Assessment/Plan afib on Eliquis -s/p recent cardioversion Hypothyroidism  OSA on CPAP  History of Hep C s/p Harwani - CT 3/26 w/ nodularity of liver, pt Hx of white stools Uterine fibroids Dibetes Mellitus   Lower lobe PNA? - per medicine Leukocytosis Abdominal pain Colitis/enteritis unk etiology - no prior history of diverticulitis - consider switching antibiotics to Zosyn - if possible, consider CT scan w/ contrast to better evaluate.  - will discuss with Dr Damaris Schooner, [facility] Surgery 08/02/2017, 1:12 PM Pager: [phone] Consults: [phone] Mon-Fri 7:00 am-4:30 pm Sat-Sun 7:00 am-11:30 am

## 2017-08-02 NOTE — Progress Notes (Signed)
Pharmacy Antibiotic Note  Morgan Clay is a 72 y.o. female admitted on 07/31/2017 with intra-abdominal infection.  Pharmacy has been consulted for Zosyn dosing. CT scan shows diverticulitis and possible enteritis; bibasilar pneumonia.  Ceftriaxone, and Doxycycline x1 in ED. Pt on Zosyn x 2 days, now being changed to Unasyn  Afebrile WBC 11.9 SCr 2.51, CrCl ~ 18   Plan: Zosyn 2.25 gr IV q6h  Follow up renal fxn, culture results, and clinical course.   Height: 4\' 11"  (149.9 cm) Weight: 151 lb 14.4 oz (68.9 kg) IBW/kg (Calculated) : 43.2  Temp (24hrs), Avg:97.9 F (36.6 C), Min:97.5 F (36.4 C), Max:98.3 F (36.8 C)  Recent Labs  Lab 07/31/17 1229 07/31/17 1546 08/01/17 0237 08/02/17 0944  WBC 11.9*  --  14.9*  --   CREATININE 2.19*  --  2.12* 2.51*  LATICACIDVEN  --  0.90  --   --     Estimated Creatinine Clearance: 17.9 mL/min (A) (by C-G formula based on SCr of 2.51 mg/dL (H)).    No Known Allergies  Thank you for allowing pharmacy to be a part of this patient's care.   Royetta Asal, PharmD, BCPS Pager [phone] 08/02/2017 5:38 PM

## 2017-08-03 ENCOUNTER — Inpatient Hospital Stay (HOSPITAL_COMMUNITY): Payer: Medicare Other

## 2017-08-03 ENCOUNTER — Encounter: Payer: Self-pay | Admitting: Cardiology

## 2017-08-03 LAB — BASIC METABOLIC PANEL
Anion gap: 10 (ref 5–15)
BUN: 70 mg/dL — ABNORMAL HIGH (ref 6–20)
CALCIUM: 8.4 mg/dL — AB (ref 8.9–10.3)
CO2: 22 mmol/L (ref 22–32)
CREATININE: 3.3 mg/dL — AB (ref 0.44–1.00)
Chloride: 103 mmol/L (ref 101–111)
GFR, EST AFRICAN AMERICAN: 15 mL/min — AB (ref >60)
GFR, EST NON AFRICAN AMERICAN: 13 mL/min — AB (ref >60)
Glucose, Bld: 105 mg/dL — ABNORMAL HIGH (ref 65–99)
Potassium: 4.2 mmol/L (ref 3.5–5.1)
Sodium: 135 mmol/L (ref 135–145)

## 2017-08-03 LAB — URINALYSIS, ROUTINE W REFLEX MICROSCOPIC
Bilirubin Urine: NEGATIVE
Glucose, UA: NEGATIVE mg/dL
Hgb urine dipstick: NEGATIVE
KETONES UR: 5 mg/dL — AB
Leukocytes, UA: NEGATIVE
Nitrite: NEGATIVE
PROTEIN: NEGATIVE mg/dL
Specific Gravity, Urine: 1.019 (ref 1.005–1.030)
pH: 5 (ref 5.0–8.0)

## 2017-08-03 LAB — CBC
HCT: 34.4 % — ABNORMAL LOW (ref 36.0–46.0)
HEMOGLOBIN: 11.2 g/dL — AB (ref 12.0–15.0)
MCH: 29 pg (ref 26.0–34.0)
MCHC: 32.6 g/dL (ref 30.0–36.0)
MCV: 89.1 fL (ref 78.0–100.0)
PLATELETS: 179 10*3/uL (ref 150–400)
RBC: 3.86 MIL/uL — AB (ref 3.87–5.11)
RDW: 15.6 % — ABNORMAL HIGH (ref 11.5–15.5)
WBC: 9.8 10*3/uL (ref 4.0–10.5)

## 2017-08-03 LAB — LACTIC ACID, PLASMA: LACTIC ACID, VENOUS: 0.7 mmol/L (ref 0.5–1.9)

## 2017-08-03 LAB — GLUCOSE, CAPILLARY
GLUCOSE-CAPILLARY: 102 mg/dL — AB (ref 65–99)
GLUCOSE-CAPILLARY: 99 mg/dL (ref 65–99)
Glucose-Capillary: 93 mg/dL (ref 65–99)
Glucose-Capillary: 117 mg/dL — ABNORMAL HIGH (ref 65–99)

## 2017-08-03 MED ORDER — SODIUM CHLORIDE 0.9 % IV BOLUS
1000.0000 mL | Freq: Once | INTRAVENOUS | Status: AC
  Administered 2017-08-03: 1000 mL via INTRAVENOUS

## 2017-08-03 MED ORDER — APIXABAN 2.5 MG PO TABS
2.5000 mg | ORAL_TABLET | Freq: Two times a day (BID) | ORAL | Status: DC
  Administered 2017-08-03 – 2017-08-04 (×2): 2.5 mg via ORAL
  Filled 2017-08-03 (×2): qty 1

## 2017-08-03 MED ORDER — IOPAMIDOL (ISOVUE-300) INJECTION 61%: 15.0000 mL | Freq: Two times a day (BID) | INTRAVENOUS | Status: DC | PRN

## 2017-08-03 MED ORDER — MORPHINE SULFATE (PF) 2 MG/ML IV SOLN
2.0000 mg | INTRAVENOUS | Status: DC | PRN
  Administered 2017-08-03 – 2017-08-09 (×15): 2 mg via INTRAVENOUS
  Filled 2017-08-03 (×16): qty 1

## 2017-08-03 MED ORDER — CARVEDILOL 12.5 MG PO TABS
12.5000 mg | ORAL_TABLET | Freq: Two times a day (BID) | ORAL | Status: DC
  Administered 2017-08-03 – 2017-08-13 (×17): 12.5 mg via ORAL
  Filled 2017-08-03 (×19): qty 1

## 2017-08-03 MED ORDER — IOPAMIDOL (ISOVUE-300) INJECTION 61%
INTRAVENOUS | Status: AC
  Administered 2017-08-03: 15 mL
  Filled 2017-08-03: qty 30

## 2017-08-03 NOTE — Care Management Important Message (Signed)
Important Message  Patient Details  Name: Morgan Clay MRN: [id number] Date of Birth: 04-13-1946   Medicare Important Message Given:  Yes    Kerin Salen 08/03/2017, 10:43 AMImportant Message  Patient Details  Name: Morgan Clay MRN: [id number] Date of Birth: 22-Nov-1945   Medicare Important Message Given:  Yes    Kerin Salen 08/03/2017, 10:43 AM

## 2017-08-03 NOTE — Progress Notes (Signed)
[facility] Progress Note     Subjective: CC:  Abdominal pain rated as 8/10 compared to 9/10 yesterday - minimally improved. States the pain is constant with intermittent sharp cramps. Denies N/V. Had a small, white, "gel like" BM this AM.    Objective: Vital signs in last 24 hours: Temp:  [97.3 F (36.3 C)-98 F (36.7 C)] 97.3 F (36.3 C) (03/29 0612) Pulse Rate:  [59] 59 (03/29 0612) Resp:  [16-17] 16 (03/29 0612) BP: (97-142)/(53-62) 97/53 (03/29 0612) SpO2:  [95 %-100 %] 96 % (03/29 0612) Weight:  [70.8 kg (156 lb 1.4 oz)] 70.8 kg (156 lb 1.4 oz) (03/29 0612) Last BM Date: 08/02/17  Intake/Output from previous day: 03/28 0701 - 03/29 0700 In: 49 [P.O.:780; IV Piggyback:100] Out: 300 [Urine:300] Intake/Output this shift: Total I/O In: -  Out: 50 [Urine:50]  PE: Gen:  Alert, NAD, pleasant Card:  Regular rate and rhythm, No LE edema  Pulm:  Normal effort, diminished breath sounds in BL lung bases Abd: Soft, TTP RUQ and epigastrium with guarding, mild TTP LLQ. No peritonitis. +BS Skin: warm and dry, no rashes  Psych: A&Ox3      Lab Results:  Recent Labs    08/01/17 0237 08/03/17 0323  WBC 14.9* 9.8  HGB 13.3 11.2*  HCT 40.0 34.4*  PLT 174 179   BMET Recent Labs    08/02/17 0944 08/03/17 0323  NA 139 135  K 4.2 4.2  CL 104 103  CO2 19* 22  GLUCOSE 120* 105*  BUN 68* 70*  CREATININE 2.51* 3.30*  CALCIUM 9.3 8.4*   PT/INR Recent Labs    07/31/17 1537  LABPROT 16.8*  INR 1.37   CMP     Component Value Date/Time   NA 135 08/03/2017 0323   NA 140 07/19/2017 1503   K 4.2 08/03/2017 0323   CL 103 08/03/2017 0323   CO2 22 08/03/2017 0323   GLUCOSE 105 (H) 08/03/2017 0323   BUN 70 (H) 08/03/2017 0323   BUN 38 (H) 07/19/2017 1503   CREATININE 3.30 (H) 08/03/2017 0323   CREATININE 1.26 (H) 02/25/2016 1607   CALCIUM 8.4 (L) 08/03/2017 0323   PROT 9.3 (H) 07/31/2017 1537   PROT 8.3 07/19/2017 1503   ALBUMIN 4.6 07/31/2017 1537   ALBUMIN 4.9 (H) 07/19/2017 1503   AST 16 07/31/2017 1537   ALT 13 (L) 07/31/2017 1537   ALKPHOS 69 07/31/2017 1537   BILITOT 1.0 07/31/2017 1537   BILITOT 0.3 07/19/2017 1503   GFRNONAA 13 (L) 08/03/2017 0323   GFRAA 15 (L) 08/03/2017 0323   Lipase     Component Value Date/Time   LIPASE 25 07/31/2017 1537       Studies/Results: No results found.  Anti-infectives: Anti-infectives (From admission, onward)   Start     Dose/Rate Route Frequency Ordered Stop   08/02/17 1800  piperacillin-tazobactam (ZOSYN) IVPB 2.25 g     2.25 g 100 mL/hr over 30 Minutes Intravenous Every 6 hours 08/02/17 1737     07/31/17 2200  Ampicillin-Sulbactam (UNASYN) 3 g in sodium chloride 0.9 % 100 mL IVPB  Status:  Discontinued     3 g 200 mL/hr over 30 Minutes Intravenous Every 12 hours 07/31/17 2132 08/02/17 1708   07/31/17 1830  cefTRIAXone (ROCEPHIN) 2 g in sodium chloride 0.9 % 100 mL IVPB     2 g 200 mL/hr over 30 Minutes Intravenous  Once 07/31/17 1822 07/31/17 1919   07/31/17 1830  metroNIDAZOLE (FLAGYL) IVPB 500 mg  Status:  Discontinued     500 mg 100 mL/hr over 60 Minutes Intravenous  Once 07/31/17 1822 07/31/17 2053   07/31/17 1830  doxycycline (VIBRAMYCIN) 100 mg in sodium chloride 0.9 % 250 mL IVPB  Status:  Discontinued     100 mg 125 mL/hr over 120 Minutes Intravenous  Once 07/31/17 1822 07/31/17 2053     Assessment/Plan afib on Eliquis -s/p recent cardioversion Hypothyroidism  OSA on CPAP  History of Hep C s/p Harwani - CT 3/26 w/ nodularity of liver, pt Hx of white stools Uterine fibroids Dibetes Mellitus  AKI w/ CKD - SCr increased today, GFR 15 Lower lobe PNA? - per medicine Leukocytosis - resolved   Abdominal pain Colitis/enteritis unk etiology - no prior history of diverticulitis - afebrile, no tachycardia, WBC WNL today - continue IV Zosyn - clinically the patient is making little progress, N/V improved but pain not improving, now with guarding on exam. Will order  CT abd pelvis with PO contrast to better evaluate intestinal tract.   FEN: clear liquids, would not advance diet  ID: Unasyn 3/26-3/28, Zosyn 3/28>>  VTE: SCD's, Eliquis,  Foley: none    LOS: 3 days    Jill Alexanders , [facility] 08/03/2017, 9:22 AM Pager: [phone] Consults: [phone] Mon-Fri 7:00 am-4:30 pm Sat-Sun 7:00 am-11:30 am

## 2017-08-03 NOTE — Consult Note (Signed)
[facility] Consult Note     Date: 08/03/2017                  Patient Name:  Morgan Clay  MRN: [id number]  DOB: May 07, 1946  Age / Sex: 71 y.o., female         PCP: [name], DO                 Service Requesting Consult: [facility]- Dr. Karleen Hampshire                 Reason for Consult: AKI on CKD            Chief Complaint: abd pain n/v  HPI: Pt is a lovely 50F with a PMH sig for HTN, HLD, DM II, NICM EF 45%, h/o Hep C s/p Harvoni rx, Afib on Eliquis s/p DCCV 3/19, and CKD followed by Dr Lorrene Reid who is now seen in consultation at the request of Dr. Karleen Hampshire for eval and recs re: AKI on CKD.  Pt was in her usual state of health until Monday when she began having abd pain, n/v.  She initially had diarrhea but then was constipated.  CT abd/pelvis showed prox sigmoid diverticulitis, possible enteritis, and bilateral LL PNA.    She has been placed on Zosyn.  Initially had doxy/ CTX.    Baseline Cr is 1.7-1.9 as of 07/2017 and now has uptrended to 3.2.  She is still making plenty of urine.  No NSAIDs.  Was taking Lasix, aldactone, and entresto at home.    She reports feeling better.      Past Medical History:  Diagnosis Date  . Arthritis   . Asthma    reports mild asthma since childhood - had COPD on dx list from prior PCP  . Atypical atrial flutter ([place]) 09/28/2015  . Chronic systolic congestive heart failure ([place])   . DEPRESSION 12/17/2009   Annotation: PHQ-9 score = 14 done on 12/17/2009 Qualifier: Diagnosis of  By: Hassell Done FNP, Tori Milks    . FIBROIDS, UTERUS 03/05/2008  . Glaucoma   . HEPATITIS C - s/p treatment with Harvoni, seeing hepatology, Dawn Drazek 01/04/2007  . Hyperlipemia 12/06/2012  . HYPERTENSION, BENIGN 04/24/2007  . Hypothyroidism   . LBBB (left bundle branch block)   . Nonischemic cardiomyopathy ([place])   . OBESITY 05/27/2009  . OBSTRUCTIVE SLEEP APNEA 11/14/2007   npsg 2009:  Mild osa with AHI 11/hr. Started cpap 2009, quit using due to lack  of response Home sleep testing 09/2010 with AHI only 7/hr and weight loss advised by pulmonology.  . OSTEOPENIA 09/30/2008  . Personal history of other infectious and parasitic disease    Hepatitis B    Past Surgical History:  Procedure Laterality Date  . appendectomy    . CARDIAC CATHETERIZATION    . CARDIOVERSION N/A 12/14/2014   Procedure: CARDIOVERSION;  Surgeon: Dorothy Spark, MD;  Location: [facility] ENDOSCOPY;  Service: Cardiovascular;  Laterality: N/A;  . CARDIOVERSION N/A 02/26/2017   Procedure: CARDIOVERSION;  Surgeon: Evans Lance, MD;  Location: [place] CV LAB;  Service: Cardiovascular;  Laterality: N/A;  . CARDIOVERSION N/A 07/24/2017   Procedure: CARDIOVERSION;  Surgeon: Thayer Headings, MD;  Location: [facility] ENDOSCOPY;  Service: Cardiovascular;  Laterality: N/A;  . EP IMPLANTABLE DEVICE N/A 10/06/2015   Procedure: BIV ICD Generator Changeout;  Surgeon: [name], MD;  Location: [place] CV LAB;  Service: Cardiovascular;  Laterality: N/A;    Family History  Problem  Relation Age of Onset  . Asthma Father   . Heart attack Father   . Asthma Sister   . Cancer Sister        lung  . Heart attack Mother   . Stroke Brother   . Diabetes Unknown    Social History:  reports that she has never smoked. She has never used smokeless tobacco. She reports that she drinks alcohol. She reports that she does not use drugs.  Allergies: No Known Allergies  Medications Prior to Admission  Medication Sig Dispense Refill  . amiodarone (PACERONE) 200 MG tablet 2/20-2/26 take 2 tablets, 433m twice/day 2/27-3/19- take 1 tablet, 2081mtwice/day March 20- one tablet, 20079maily (Patient taking differently: Take 200 mg by mouth 2 (two) times daily. 2/20-2/26 take 2 tablets, 400m29mice/day 2/27-3/19- take 1 tablet, 200mg72mce/day March 20- one tablet, 200mg 59my) 90 tablet 3  . BIDIL 20-37.5 MG tablet TAKE 1 TABLET BY MOUTH TWICE DAILY (Patient taking differently: TAKE 1 TABLET (37.5 mg)  BY MOUTH TWICE DAILY) 180 tablet 3  . carvedilol (COREG) 25 MG tablet Take 1 tablet (25 mg total) by mouth 2 (two) times daily. 180 tablet 3  . ELIQUIS 5 MG TABS tablet TAKE ONE TABLET BY MOUTH TWICE DAILY 180 tablet 3  . ENTRESTO 97-103 MG TAKE 1 TABLET BY MOUTH TWICE DAILY **TITRATED  DOSE  UP** 60 tablet 10  . furosemide (LASIX) 40 MG tablet Take 60 mg by mouth daily.    . levoMarland Kitchenhyroxine (SYNTHROID, LEVOTHROID) 125 MCG tablet Take 1 tablet (125 mcg total) by mouth daily. (Patient taking differently: Take 125 mcg by mouth daily before breakfast. ) 90 tablet 0  . sertraline (ZOLOFT) 50 MG tablet Take 1 tablet (50 mg total) by mouth daily. 30 tablet 3  . spironolactone (ALDACTONE) 25 MG tablet Take 1 tablet (25 mg total) daily by mouth. 90 tablet 3  . Tetrahydrozoline HCl (VISINE OP) Place 1 drop into both eyes daily as needed (DRY EYES).    . traZODone (DESYREL) 50 MG tablet Take 1 tablet (50 mg total) by mouth at bedtime. (Patient taking differently: Take 50 mg by mouth at bedtime as needed for sleep. ) 90 tablet 3  . albuterol (PROVENTIL HFA;VENTOLIN HFA) 108 (90 BASE) MCG/ACT inhaler Inhale 2 puffs into the lungs every 6 (six) hours as needed for wheezing or shortness of breath. (Patient not taking: Reported on 07/31/2017) 1 Inhaler 3  . fluticasone (FLONASE) 50 MCG/ACT nasal spray Place 2 sprays into both nostrils daily. (Patient not taking: Reported on 07/31/2017) 16 g 6  . nitroGLYCERIN (NITROSTAT) 0.4 MG SL tablet Place 0.4 mg under the tongue every 5 (five) minutes as needed for chest pain.      Results for orders placed or performed during the hospital encounter of 07/31/17 (from the past 48 hour(s))  Glucose, capillary     Status: Abnormal   Collection Time: 08/01/17  5:10 PM  Result Value Ref Range   Glucose-Capillary 116 (H) 65 - 99 mg/dL   Comment 1 Notify RN    Comment 2 Document in Chart   Glucose, capillary     Status: Abnormal   Collection Time: 08/01/17  9:02 PM  Result Value  Ref Range   Glucose-Capillary 114 (H) 65 - 99 mg/dL   Comment 1 Notify RN   Glucose, capillary     Status: Abnormal   Collection Time: 08/02/17  7:58 AM  Result Value Ref Range   Glucose-Capillary 113 (H) 65 -  99 mg/dL   Comment 1 Notify RN    Comment 2 Document in Chart   Basic metabolic panel     Status: Abnormal   Collection Time: 08/02/17  9:44 AM  Result Value Ref Range   Sodium 139 135 - 145 mmol/L   Potassium 4.2 3.5 - 5.1 mmol/L   Chloride 104 101 - 111 mmol/L   CO2 19 (L) 22 - 32 mmol/L   Glucose, Bld 120 (H) 65 - 99 mg/dL   BUN 68 (H) 6 - 20 mg/dL   Creatinine, Ser 2.51 (H) 0.44 - 1.00 mg/dL   Calcium 9.3 8.9 - 10.3 mg/dL   GFR calc non Af Amer 18 (L) >60 mL/min   GFR calc Af Amer 21 (L) >60 mL/min    Comment: (NOTE) The eGFR has been calculated using the CKD EPI equation. This calculation has not been validated in all clinical situations. eGFR's persistently <60 mL/min signify possible Chronic Kidney Disease.    Anion gap 16 (H) 5 - 15    Comment: Performed at [facility], [place] [address] [place], [place] [address]  Glucose, capillary     Status: Abnormal   Collection Time: 08/02/17 12:22 PM  Result Value Ref Range   Glucose-Capillary 140 (H) 65 - 99 mg/dL   Comment 1 Notify RN    Comment 2 Document in Chart   Glucose, capillary     Status: Abnormal   Collection Time: 08/02/17  5:10 PM  Result Value Ref Range   Glucose-Capillary 109 (H) 65 - 99 mg/dL   Comment 1 Notify RN    Comment 2 Document in Chart   Glucose, capillary     Status: Abnormal   Collection Time: 08/02/17  9:38 PM  Result Value Ref Range   Glucose-Capillary 112 (H) 65 - 99 mg/dL   Comment 1 Notify RN   Basic metabolic panel     Status: Abnormal   Collection Time: 08/03/17  3:23 AM  Result Value Ref Range   Sodium 135 135 - 145 mmol/L   Potassium 4.2 3.5 - 5.1 mmol/L   Chloride 103 101 - 111 mmol/L   CO2 22 22 - 32 mmol/L   Glucose, Bld 105 (H) 65 - 99 mg/dL   BUN 70  (H) 6 - 20 mg/dL   Creatinine, Ser 3.30 (H) 0.44 - 1.00 mg/dL   Calcium 8.4 (L) 8.9 - 10.3 mg/dL   GFR calc non Af Amer 13 (L) >60 mL/min   GFR calc Af Amer 15 (L) >60 mL/min    Comment: (NOTE) The eGFR has been calculated using the CKD EPI equation. This calculation has not been validated in all clinical situations. eGFR's persistently <60 mL/min signify possible Chronic Kidney Disease.    Anion gap 10 5 - 15    Comment: Performed at [facility], [place] [address] [place], [place] [address]  CBC     Status: Abnormal   Collection Time: 08/03/17  3:23 AM  Result Value Ref Range   WBC 9.8 4.0 - 10.5 K/uL   RBC 3.86 (L) 3.87 - 5.11 MIL/uL   Hemoglobin 11.2 (L) 12.0 - 15.0 g/dL   HCT 34.4 (L) 36.0 - 46.0 %   MCV 89.1 78.0 - 100.0 fL   MCH 29.0 26.0 - 34.0 pg   MCHC 32.6 30.0 - 36.0 g/dL   RDW 15.6 (H) 11.5 - 15.5 %   Platelets 179 150 - 400 K/uL    Comment: Performed at [facility]  [place] [address] [place], [place] [address]  Glucose, capillary     Status: Abnormal   Collection Time: 08/03/17  8:02 AM  Result Value Ref Range   Glucose-Capillary 102 (H) 65 - 99 mg/dL  Lactic acid, plasma     Status: None   Collection Time: 08/03/17 11:16 AM  Result Value Ref Range   Lactic Acid, Venous 0.7 0.5 - 1.9 mmol/L    Comment: Performed at [facility], [place] [address] [place], [place] [address]  Glucose, capillary     Status: None   Collection Time: 08/03/17 11:45 AM  Result Value Ref Range   Glucose-Capillary 93 65 - 99 mg/dL   No results found.  ROS: all other systems reviewed and are negative except as per HPI  Blood pressure (!) 106/56, pulse 60, temperature (!) 97.4 F (36.3 C), temperature source Oral, resp. rate 16, height '4\' 11"'  (1.499 m), weight 70.8 kg (156 lb 1.4 oz), SpO2 95 %. Physical Exam  GEN NAD, getting renal US HEENT EOMI, PERRL NECK flat neck veins PULM clear bilaterally no c/w/r CV RRR soft systolic  murmur ABD obese, slightly distended, hypoactive bowel sounds, no rebound/ guarding EXT no LE edema NEURO nonfocal SKIN no rashes or lesions   Assessment/Plan  1.  AKI on CKD III: Likely hemodynamically mediated in setting of poor PO intake, Entresto, Lasix/ aldactone.  She is nonoliguric and she has no metabolic abnormalities.  Avoid NSAIDs, IV contrast, fleets enemas, mag citrate, PPIs.  I will follow up renal US.  She doesn't have any indications for dialysis at this point- hopefully we can squeak by without it this time.  2.  Diverticulitis: on Zosyn.  CT without abscess or perf.  3.  NICM: hold ACEi and diuretics.  Continuing carvedilol, hydral, and BiDil.  Pressures are a little low- will decrease carvedilol to 12.5 BID for now and follow.  4.  Afib: s/p DCCV 3/19.  On Eliquis 2.5 mg BID and amiodarone   [name], MD [place] pgr [phone] 08/03/2017, 3:30 PM

## 2017-08-03 NOTE — Progress Notes (Signed)
PROGRESS NOTE    Morgan Clay  VXB:[id number] DOB: 02-28-1946 DOA: 07/31/2017 PCP: [name], DO    Brief Narrative: Morgan Clay is a 72 y.o. female with medical history significant for NICM (last EF 45%) s/p CRT-D, A. fib on Eliquis with recent DCCV on 3/19, OSA on CPAP, HCV status post Harvoni (viremic response not documented), hypothyroidism, depression and diet managed type 2 diabetes mellitus who presents to the ED with 5 days of diffuse, generalized abdominal pain, with associated anorexia, nausea and one episode of profuse nonbloody, nonbilious emesis on the day of presentation.  CT abdomen and pelvis without contrast showed Suspected mild acute diverticulitis involving the proximal sigmoid colon. Scattered small bowel air-fluid levels and with questionable wall thickening of a few left-sided small bowel loops, query enteritis.  Bilateral lower lobe lung opacities compatible with pneumonia.    Assessment & Plan:   Active Problems:   Diverticulitis  Acute sigmoid diverticulitis and enteritis Was initially on unasyn, changed to zosyn today.  Patient continues to have persistent lower abdominal pain so we will not de-escalate the antibiotics or  advance her diet.  Continue with liquid diet pain control antiemetics as needed and IV fluids. Surgery consulted for persistent abd pain, nausea, and vomiting. Add protonix IV . Repeat CT abdomen and pelvis ordered by surgery for persistent symptoms.  ? Ischemic bowel, unfortunately, couldn't use contrast because of AKI. Repeat lactate is normal.  Wbc count continues to be wnl.     Acute renal failure patient's baseline creatinine appears to be around 1.5,  Admitted with a creatinine around 2, since admission creatinine worsening and currently is 3.3 at this time.  Holding Entresto, Aldactone and Lasix since admission ,  avoid nephrotoxins.  Strict in and out and daily weights.  Continue to monitor creatinine. Started her on IV  fluids NS 126ml/ hr and recheck creatinine in am. UA is negative for infection.  Suspect prerenal as patient has been hypotensive, dehydrated with decreased urine output.  Nephrology consulted for recommendations.   Chronic systolic heart failure patient is slightly dehydrated we will hold the Lasix Aldactone and Entresto.  Resume the rest of the home medications as ordered, Coreg.  Elevated troponin's  Sec to demand ischemia. She denies any chest pain.   Chronic atrial fibrillation rate controlled on Eliquis liquids dose changed because of renal function.   Hypothyroidism resume Synthroid.   Obstructive sleep apnea on CPAP at night continue the same.   History of hepatitis C status post Harwani.  Outpatient follow-up with PCP..   Diabetes mellitus  CBG (last 3)  Recent Labs    08/02/17 2138 08/03/17 0802 08/03/17 1145  GLUCAP 112* 102* 93   Well-controlled. Diet controlled. Resume SSI.  No changes to medications.    DVT prophylaxis: (Eliquis Code Status: Full code Family Communication: No family at bedside Disposition Plan: Possibly home once pain and nausea vomiting resolves.   Consultants:   Surgery.  Nephrology  Procedures: None  Antimicrobials: zosyn.   Subjective: She continues to have nausea lower abdominal pain.  Vomiting resolved.  No diarrhea  Objective: Vitals:   08/02/17 1646 08/02/17 2138 08/03/17 0612 08/03/17 1248  BP: (!) 142/62 (!) 105/59 (!) 97/53 (!) 106/56  Pulse: (!) 59 (!) 59 (!) 59 60  Resp: 17 16 16 16   Temp: 98 F (36.7 C) 97.8 F (36.6 C) (!) 97.3 F (36.3 C) (!) 97.4 F (36.3 C)  TempSrc: Oral Oral Oral Oral  SpO2: 100% 95% 96% 95%  Weight:   70.8 kg (156 lb 1.4 oz)   Height:        Intake/Output Summary (Last 24 hours) at 08/03/2017 1546 Last data filed at 08/03/2017 1355 Gross per 24 hour  Intake -  Output 350 ml  Net -350 ml   Filed Weights   07/31/17 1124 08/02/17 0527 08/03/17 0612  Weight: 68.5 kg (151 lb)  68.9 kg (151 lb 14.4 oz) 70.8 kg (156 lb 1.4 oz)    Examination:  General exam: Continues to be in mild distress from abdominal pain Respiratory system: Clear no wheezing or rhonchi Cardiovascular system: S1 & S2 heard, RRR. No JVD, murmurs . No pedal edema. Gastrointestinal system: Abdomen is soft, mildly distended, generalized tenderness.  Bowel sounds good.  Central nervous system: Alert and oriented.  Nonfocal Extremities: Symmetric 5 x 5 power. Skin: No rashes, lesions or ulcers Psychiatry:  Mood & affect appropriate.     Data Reviewed: I have personally reviewed following labs and imaging studies  CBC: Recent Labs  Lab 07/31/17 1229 08/01/17 0237 08/03/17 0323  WBC 11.9* 14.9* 9.8  HGB 14.8 13.3 11.2*  HCT 43.9 40.0 34.4*  MCV 88.5 89.3 89.1  PLT 188 174 097   Basic Metabolic Panel: Recent Labs  Lab 07/31/17 1229 07/31/17 2115 08/01/17 0237 08/02/17 0944 08/03/17 0323  NA 136  --  137 139 135  K 4.4  --  4.4 4.2 4.2  CL 102  --  104 104 103  CO2 22  --  21* 19* 22  GLUCOSE 140*  --  112* 120* 105*  BUN 54*  --  51* 68* 70*  CREATININE 2.19*  --  2.12* 2.51* 3.30*  CALCIUM 9.6  --  9.0 9.3 8.4*  MG  --  2.0 2.1  --   --   PHOS  --  3.3 3.4  --   --    GFR: Estimated Creatinine Clearance: 13.8 mL/min (A) (by C-G formula based on SCr of 3.3 mg/dL (H)). Liver Function Tests: Recent Labs  Lab 07/31/17 1537  AST 16  ALT 13*  ALKPHOS 69  BILITOT 1.0  PROT 9.3*  ALBUMIN 4.6   Recent Labs  Lab 07/31/17 1537  LIPASE 25   No results for input(s): AMMONIA in the last 168 hours. Coagulation Profile: Recent Labs  Lab 07/31/17 1537  INR 1.37   Cardiac Enzymes: Recent Labs  Lab 07/31/17 1537 07/31/17 2115 08/01/17 0237 08/01/17 0715  TROPONINI 0.04* 0.05* 0.05* 0.05*   BNP (last 3 results) No results for input(s): PROBNP in the last 8760 hours. HbA1C: No results for input(s): HGBA1C in the last 72 hours. CBG: Recent Labs  Lab  08/02/17 1222 08/02/17 1710 08/02/17 2138 08/03/17 0802 08/03/17 1145  GLUCAP 140* 109* 112* 102* 93   Lipid Profile: No results for input(s): CHOL, HDL, LDLCALC, TRIG, CHOLHDL, LDLDIRECT in the last 72 hours. Thyroid Function Tests: No results for input(s): TSH, T4TOTAL, FREET4, T3FREE, THYROIDAB in the last 72 hours. Anemia Panel: No results for input(s): VITAMINB12, FOLATE, FERRITIN, TIBC, IRON, RETICCTPCT in the last 72 hours. Sepsis Labs: Recent Labs  Lab 07/31/17 1546 08/03/17 1116  LATICACIDVEN 0.90 0.7    No results found for this or any previous visit (from the past 240 hour(s)).       Radiology Studies: No results found.      Scheduled Meds: . amiodarone  200 mg Oral Daily  . apixaban  2.5 mg Oral BID  . carvedilol  25 mg  Oral BID WC  . insulin aspart  0-5 Units Subcutaneous QHS  . insulin aspart  0-9 Units Subcutaneous TID WC  . isosorbide-hydrALAZINE  1 tablet Oral BID  . levothyroxine  125 mcg Oral QAC breakfast  . pantoprazole (PROTONIX) IV  40 mg Intravenous Q24H  . sertraline  50 mg Oral Daily   Continuous Infusions: . sodium chloride 100 mL/hr at 08/03/17 0057  . piperacillin-tazobactam (ZOSYN)  IV 2.25 g (08/03/17 1308)     LOS: 3 days    Time spent: 35 minutes.     Hosie Poisson, MD [facility] Pager [phone]  If 7PM-7AM, please contact night-coverage [email or web address] Password [facility] 08/03/2017, 3:46 PM

## 2017-08-04 LAB — GLUCOSE, CAPILLARY
GLUCOSE-CAPILLARY: 99 mg/dL (ref 65–99)
GLUCOSE-CAPILLARY: 101 mg/dL — AB (ref 65–99)
GLUCOSE-CAPILLARY: 91 mg/dL (ref 65–99)
Glucose-Capillary: 88 mg/dL (ref 65–99)
Glucose-Capillary: 93 mg/dL (ref 65–99)

## 2017-08-04 LAB — BASIC METABOLIC PANEL
Anion gap: 11 (ref 5–15)
BUN: 73 mg/dL — AB (ref 6–20)
CHLORIDE: 112 mmol/L — AB (ref 101–111)
CO2: 17 mmol/L — AB (ref 22–32)
CREATININE: 3.77 mg/dL — AB (ref 0.44–1.00)
Calcium: 8.3 mg/dL — ABNORMAL LOW (ref 8.9–10.3)
GFR calc Af Amer: 13 mL/min — ABNORMAL LOW (ref >60)
GFR calc non Af Amer: 11 mL/min — ABNORMAL LOW (ref >60)
GLUCOSE: 90 mg/dL (ref 65–99)
Potassium: 4.6 mmol/L (ref 3.5–5.1)
Sodium: 140 mmol/L (ref 135–145)

## 2017-08-04 MED ORDER — ENOXAPARIN SODIUM 80 MG/0.8ML ~~LOC~~ SOLN
70.0000 mg | SUBCUTANEOUS | Status: DC
  Administered 2017-08-05 – 2017-08-08 (×4): 70 mg via SUBCUTANEOUS
  Filled 2017-08-04 (×4): qty 0.8

## 2017-08-04 NOTE — Progress Notes (Addendum)
[facility]:  [phone] General Surgery Progress Note   LOS: 4 days  POD -     Chief Complaint: Abdominal pain  Assessment and Plan: 1.  Sigmoid colon diverticulitis/enteritis  WBC - 9,800 - 08/03/2017  Zosyn  CT scan of abdomen - 08/03/2017 - moderate SB dilatation, sigmoid diverticulitis, nodular liver, calcified uterine fibroid   She clinically looks good today.  She is not nauseated.  She needs to ambulate.  2. A Fib 3.  Anticoagulated - on Eliquis 4.  OSA 5.  History of Hep C  CT scan shows nodularity of the liver 6.  DM 7.  CKD/AKD -  Creat - 3.30 - 08/03/2017 8.  Pacemaker - S. Klein 9.  Nodular liver - question cirrhosis   Active Problems:   Hypothyroidism   Obesity   OBSTRUCTIVE SLEEP APNEA   Type 2 diabetes, controlled, with renal manifestation (HCC)   Chronic kidney disease - followed by [facility]   Chronic obstructive pulmonary disease ([place])   Hypertension associated with diabetes ([place])   Persistent atrial fibrillation ([place])   Diverticulitis   Subjective:  Doing better.  Less abdominal pain.  Objective:   Vitals:   08/03/17 1248 08/03/17 2058  BP: (!) 106/56 (!) 169/55  Pulse: 60 62  Resp: 16 15  Temp: (!) 97.4 F (36.3 C) 98.2 F (36.8 C)  SpO2: 95% 100%     Intake/Output from previous day:  03/29 0701 - 03/30 0700 In: 3790 [P.O.:240; I.V.:900; IV Piggyback:150] Out: 350 [Urine:350]  Intake/Output this shift:  Total I/O In: 1631.7 [I.V.:1581.7; IV Piggyback:50] Out: -    Physical Exam:   General: Older AA F who is alert and oriented.    HEENT: Normal. Pupils equal. .   Lungs: Clear   Abdomen: Mild distention, but has BS.  Minimal tenderness.   Lab Results:    Recent Labs    08/03/17 0323  WBC 9.8  HGB 11.2*  HCT 34.4*  PLT 179    BMET   Recent Labs    08/02/17 0944 08/03/17 0323  NA 139 135  K 4.2 4.2  CL 104 103  CO2 19* 22  GLUCOSE 120* 105*  BUN 68* 70*  CREATININE 2.51* 3.30*   CALCIUM 9.3 8.4*    PT/INR  No results for input(s): LABPROT, INR in the last 72 hours.  ABG  No results for input(s): PHART, HCO3 in the last 72 hours.  Invalid input(s): PCO2, PO2   Studies/Results:  Ct Abdomen Pelvis Wo Contrast  Result Date: 08/03/2017 CLINICAL DATA:  Lower abdominal pain. EXAM: CT ABDOMEN AND PELVIS WITHOUT CONTRAST TECHNIQUE: Multidetector CT imaging of the abdomen and pelvis was performed following the standard protocol without IV contrast. COMPARISON:  CT scan of July 31, 2017. FINDINGS: Lower chest: Mild bibasilar subsegmental atelectasis or pneumonia is noted. Hepatobiliary: No gallstones are noted. No focal hepatic lesion is noted. Mildly nodular hepatic margins are noted suggesting cirrhosis. Pancreas: Unremarkable. No pancreatic ductal dilatation or surrounding inflammatory changes. Spleen: Normal in size without focal abnormality. Adrenals/Urinary Tract: Adrenal glands are unremarkable. Kidneys are normal, without renal calculi, focal lesion, or hydronephrosis. Bladder is unremarkable. Stomach/Bowel: The stomach appears normal. Moderate small bowel dilatation is noted concerning for ileus or distal small bowel obstruction. Proximal sigmoid diverticulitis is again noted. Vascular/Lymphatic: Aortic atherosclerosis. No enlarged abdominal or pelvic lymph nodes. Reproductive: Large calcified uterine fibroid is noted. No adnexal abnormality is noted. Other: No abnormal fluid collection is noted. Mild fluid-filled left inguinal  hernia is noted. Musculoskeletal: No acute or significant osseous findings. IMPRESSION: Interval development of moderate small bowel dilatation is noted concerning for ileus or distal small bowel obstruction. Proximal sigmoid diverticulitis is again noted. Mildly nodular hepatic margins are noted suggesting hepatic cirrhosis. Large calcified uterine fibroid is noted. Mild bibasilar subsegmental atelectasis or pneumonia is noted. Aortic Atherosclerosis  (ICD10-I70.0). Electronically Signed   By: Marijo Conception, M.D.   On: 08/03/2017 17:16   US Renal  Result Date: 08/03/2017 CLINICAL DATA:  Acute renal failure, history hypertension, hyperlipidemia, CHF, non ischemic cardiomyopathy EXAM: RENAL / URINARY TRACT ULTRASOUND COMPLETE COMPARISON:  CT abdomen and pelvis 07/31/2017 FINDINGS: Right Kidney: Length: 9.5 cm. Normal cortical thickness. Increased cortical echogenicity. No mass, hydronephrosis, or or shadowing calcification. Left Kidney: Length: 10.6 cm. Normal cortical thickness. Increased cortical echogenicity. No mass, hydronephrosis, or or shadowing calcification. Bladder: Appears normal for degree of bladder distention. BILATERAL ureteral jets noted. IMPRESSION: Medical renal disease changes of the kidneys. Electronically Signed   By: Lavonia [name] M.D.   On: 08/03/2017 15:58     Anti-infectives:   Anti-infectives (From admission, onward)   Start     Dose/Rate Route Frequency Ordered Stop   08/02/17 1800  piperacillin-tazobactam (ZOSYN) IVPB 2.25 g     2.25 g 100 mL/hr over 30 Minutes Intravenous Every 6 hours 08/02/17 1737     07/31/17 2200  Ampicillin-Sulbactam (UNASYN) 3 g in sodium chloride 0.9 % 100 mL IVPB  Status:  Discontinued     3 g 200 mL/hr over 30 Minutes Intravenous Every 12 hours 07/31/17 2132 08/02/17 1708   07/31/17 1830  cefTRIAXone (ROCEPHIN) 2 g in sodium chloride 0.9 % 100 mL IVPB     2 g 200 mL/hr over 30 Minutes Intravenous  Once 07/31/17 1822 07/31/17 1919   07/31/17 1830  metroNIDAZOLE (FLAGYL) IVPB 500 mg  Status:  Discontinued     500 mg 100 mL/hr over 60 Minutes Intravenous  Once 07/31/17 1822 07/31/17 2053   07/31/17 1830  doxycycline (VIBRAMYCIN) 100 mg in sodium chloride 0.9 % 250 mL IVPB  Status:  Discontinued     100 mg 125 mL/hr over 120 Minutes Intravenous  Once 07/31/17 1822 07/31/17 2053      Alphonsa Overall, MD, FACS Pager: [facility] Office: [phone] 08/04/2017

## 2017-08-04 NOTE — Progress Notes (Signed)
[facility] Progress Note    Assessment/ Plan:   1.  AKI on CKD III: Likely hemodynamically mediated in setting of poor PO intake, Entresto, Lasix/ aldactone.  She is nonoliguric and she has no metabolic abnormalities.  Avoid NSAIDs, IV contrast, fleets enemas, mag citrate, PPIs.  Renal US showing medicorenal disease.  She doesn't have any indications for dialysis at this point- hopefully we can squeak by without it this time.  UA bland- doubt primary GN process.  Rate of rise of Cr is slowing and hopefully will plateau soon.  Would continue gentle NS for another 12 hours and reassess  2.  Diverticulitis: on Zosyn.  CT without abscess or perf.  CCS following.  Doesn't like Zofran, maybe a low-dose phenergan?  3.  NICM: hold ACEi and diuretics.  Continuing carvedilol, hydral, and BiDil.  Pressures are a little low- have decreased carvedilol to 12.5 BID for now.  4.  Afib: s/p DCCV 3/19.  On Eliquis 2.5 mg BID as OP--> now Lovenox per pharm c/s and amiodarone    Subjective:    Still nauseated a little bit, curled up sleeping this AM   Objective:   BP (!) 121/53 (BP Location: Right Arm)   Pulse (!) 59   Temp 98.3 F (36.8 C) (Oral)   Resp 16   Ht 4\' 11"  (1.499 m)   Wt 73.3 kg (161 lb 11.2 oz)   SpO2 100%   BMI 32.66 kg/m   Intake/Output Summary (Last 24 hours) at 08/04/2017 1511 Last data filed at 08/04/2017 1432 Gross per 24 hour  Intake 3041.67 ml  Output 500 ml  Net 2541.67 ml   Weight change:   Physical Exam: GEN NAD, lying in bed HEENT EOMI, PERRL NECK flat neck veins PULM clear bilaterally no c/w/r CV RRR soft systolic murmur ABD obese, slightly distended, hypoactive bowel sounds, no rebound/ guarding EXT no LE edema NEURO nonfocal SKIN no rashes or lesions    Imaging: Ct Abdomen Pelvis Wo Contrast  Result Date: 08/03/2017 CLINICAL DATA:  Lower abdominal pain. EXAM: CT ABDOMEN AND PELVIS WITHOUT CONTRAST TECHNIQUE: Multidetector CT imaging  of the abdomen and pelvis was performed following the standard protocol without IV contrast. COMPARISON:  CT scan of July 31, 2017. FINDINGS: Lower chest: Mild bibasilar subsegmental atelectasis or pneumonia is noted. Hepatobiliary: No gallstones are noted. No focal hepatic lesion is noted. Mildly nodular hepatic margins are noted suggesting cirrhosis. Pancreas: Unremarkable. No pancreatic ductal dilatation or surrounding inflammatory changes. Spleen: Normal in size without focal abnormality. Adrenals/Urinary Tract: Adrenal glands are unremarkable. Kidneys are normal, without renal calculi, focal lesion, or hydronephrosis. Bladder is unremarkable. Stomach/Bowel: The stomach appears normal. Moderate small bowel dilatation is noted concerning for ileus or distal small bowel obstruction. Proximal sigmoid diverticulitis is again noted. Vascular/Lymphatic: Aortic atherosclerosis. No enlarged abdominal or pelvic lymph nodes. Reproductive: Large calcified uterine fibroid is noted. No adnexal abnormality is noted. Other: No abnormal fluid collection is noted. Mild fluid-filled left inguinal hernia is noted. Musculoskeletal: No acute or significant osseous findings. IMPRESSION: Interval development of moderate small bowel dilatation is noted concerning for ileus or distal small bowel obstruction. Proximal sigmoid diverticulitis is again noted. Mildly nodular hepatic margins are noted suggesting hepatic cirrhosis. Large calcified uterine fibroid is noted. Mild bibasilar subsegmental atelectasis or pneumonia is noted. Aortic Atherosclerosis (ICD10-I70.0). Electronically Signed   By: Marijo Conception, M.D.   On: 08/03/2017 17:16   US Renal  Result Date: 08/03/2017 CLINICAL DATA:  Acute renal failure,  history hypertension, hyperlipidemia, CHF, non ischemic cardiomyopathy EXAM: RENAL / URINARY TRACT ULTRASOUND COMPLETE COMPARISON:  CT abdomen and pelvis 07/31/2017 FINDINGS: Right Kidney: Length: 9.5 cm. Normal cortical  thickness. Increased cortical echogenicity. No mass, hydronephrosis, or or shadowing calcification. Left Kidney: Length: 10.6 cm. Normal cortical thickness. Increased cortical echogenicity. No mass, hydronephrosis, or or shadowing calcification. Bladder: Appears normal for degree of bladder distention. BILATERAL ureteral jets noted. IMPRESSION: Medical renal disease changes of the kidneys. Electronically Signed   By: Lavonia [name] M.D.   On: 08/03/2017 15:58    Labs: BMET Recent Labs  Lab 07/31/17 1229 07/31/17 2115 08/01/17 0237 08/02/17 0944 08/03/17 0323 08/04/17 0844  NA 136  --  137 139 135 140  K 4.4  --  4.4 4.2 4.2 4.6  CL 102  --  104 104 103 112*  CO2 22  --  21* 19* 22 17*  GLUCOSE 140*  --  112* 120* 105* 90  BUN 54*  --  51* 68* 70* 73*  CREATININE 2.19*  --  2.12* 2.51* 3.30* 3.77*  CALCIUM 9.6  --  9.0 9.3 8.4* 8.3*  PHOS  --  3.3 3.4  --   --   --    CBC Recent Labs  Lab 07/31/17 1229 08/01/17 0237 08/03/17 0323  WBC 11.9* 14.9* 9.8  HGB 14.8 13.3 11.2*  HCT 43.9 40.0 34.4*  MCV 88.5 89.3 89.1  PLT 188 174 179    Medications:    . amiodarone  200 mg Oral Daily  . carvedilol  12.5 mg Oral BID WC  . enoxaparin (LOVENOX) injection  70 mg Subcutaneous Q24H  . insulin aspart  0-5 Units Subcutaneous QHS  . insulin aspart  0-9 Units Subcutaneous TID WC  . isosorbide-hydrALAZINE  1 tablet Oral BID  . levothyroxine  125 mcg Oral QAC breakfast  . pantoprazole (PROTONIX) IV  40 mg Intravenous Q24H  . sertraline  50 mg Oral Daily      [name], MD [facility] pgr [phone] 08/04/2017, 3:11 PM

## 2017-08-04 NOTE — Progress Notes (Signed)
ANTICOAGULATION CONSULT NOTE - Initial Consult  Pharmacy Consult for Lovenox Indication: atrial fibrillation  No Known Allergies  Patient Measurements: Height: 4\' 11"  (149.9 cm) Weight: 161 lb 11.2 oz (73.3 kg) IBW/kg (Calculated) : 43.2  Vital Signs:    Labs: Recent Labs    08/02/17 0944 08/03/17 0323 08/04/17 0844  HGB  --  11.2*  --   HCT  --  34.4*  --   PLT  --  179  --   CREATININE 2.51* 3.30* 3.77*   Estimated Creatinine Clearance: 12.3 mL/min (A) (by C-G formula based on SCr of 3.77 mg/dL (H)).  Medical History: Past Medical History:  Diagnosis Date  . Arthritis   . Asthma    reports mild asthma since childhood - had COPD on dx list from prior PCP  . Atypical atrial flutter ([place]) 09/28/2015  . Chronic systolic congestive heart failure ([place])   . DEPRESSION 12/17/2009   Annotation: PHQ-9 score = 14 done on 12/17/2009 Qualifier: Diagnosis of  By: Hassell Done FNP, Tori Milks    . FIBROIDS, UTERUS 03/05/2008  . Glaucoma   . HEPATITIS C - s/p treatment with Harvoni, seeing hepatology, Dawn Drazek 01/04/2007  . Hyperlipemia 12/06/2012  . HYPERTENSION, BENIGN 04/24/2007  . Hypothyroidism   . LBBB (left bundle branch block)   . Nonischemic cardiomyopathy ([place])   . OBESITY 05/27/2009  . OBSTRUCTIVE SLEEP APNEA 11/14/2007   npsg 2009:  Mild osa with AHI 11/hr. Started cpap 2009, quit using due to lack of response Home sleep testing 09/2010 with AHI only 7/hr and weight loss advised by pulmonology.  . OSTEOPENIA 09/30/2008  . Personal history of other infectious and parasitic disease    Hepatitis B   Medications:  Scheduled:  . amiodarone  200 mg Oral Daily  . carvedilol  12.5 mg Oral BID WC  . enoxaparin (LOVENOX) injection  70 mg Subcutaneous Q24H  . insulin aspart  0-5 Units Subcutaneous QHS  . insulin aspart  0-9 Units Subcutaneous TID WC  . isosorbide-hydrALAZINE  1 tablet Oral BID  . levothyroxine  125 mcg Oral QAC breakfast  . pantoprazole (PROTONIX) IV  40 mg  Intravenous Q24H  . sertraline  50 mg Oral Daily   Infusions:  . sodium chloride 100 mL/hr at 08/04/17 1155  . piperacillin-tazobactam (ZOSYN)  IV Stopped (08/04/17 1230)   Assessment:71 yoF to ED 3/26 with abd pain. Medical history significantfor NICM (last EF 45%) s/pCRT-D, A. fib on Eliquis with recent DCCV on 3/19, OSA on CPAP, HCV status post Harvoni (viremic response not documented), hypothyroidism, depression and diet managed type 2 diabetes mellitus.  AKI on admission, suspected diverticulitis vs enteritis, CXray: prob PNA  PTA Apixaban 5mg  bid resumed  SCr continues to increase, change to Lovenox  Goal of Therapy:  Anti-Xa level 0.6-1 units/ml 4hrs after LMWH dose given, if level deemed necessary Monitor platelets by anticoagulation protocol: Yes   Plan:   Apixaban changed to Lovenox 70mg  SQ q24, last Apixaban 3/30 am  Begin Lovenox at 2200  Monitor renal function, ability to return to apixaban use  Minda Ditto PharmD Pager [phone] 08/04/2017, 12:54 PM

## 2017-08-04 NOTE — Progress Notes (Signed)
Pharmacy Antibiotic Note  Morgan Clay is a 72 y.o. female presented to the ED on 07/31/2017 with c/o abd pain and SOB. Abd pelvis CT on 3/26 showed findings consistent with mild acute diverticulitis involving the proximal sigmoid colon and bilateral LLL opacities consistent with PNA.  Patient's currently on zosyn for PNA and diverticulitis/enteritis.  Today, 08/04/2017: - day #4 abx - Afeb, WBC wnl -  scr 3.77 (crcl~12)  Plan: - continue zosyn 2.25 gm IV q6h - monitor renal function closely _________________________________________   Height: 4\' 11"  (149.9 cm) Weight: 161 lb 11.2 oz (73.3 kg) IBW/kg (Calculated) : 43.2  Temp (24hrs), Avg:97.8 F (36.6 C), Min:97.4 F (36.3 C), Max:98.2 F (36.8 C)  Recent Labs  Lab 07/31/17 1229 07/31/17 1546 08/01/17 0237 08/02/17 0944 08/03/17 0323 08/03/17 1116 08/04/17 0844  WBC 11.9*  --  14.9*  --  9.8  --   --   CREATININE 2.19*  --  2.12* 2.51* 3.30*  --  3.77*  LATICACIDVEN  --  0.90  --   --   --  0.7  --     Estimated Creatinine Clearance: 12.3 mL/min (A) (by C-G formula based on SCr of 3.77 mg/dL (H)).    No Known Allergies  Antimicrobials this admission:  3/26 Ceftriaxone / Doxy x1 3/26 Metronidazole (not given) 3/26 Unasyn >> 3/28  3/28 Zosyn >>   Dose adjustments this admission:  --  Microbiology results:  No cx  Thank you for allowing pharmacy to be a part of this patient's care.  Lynelle Doctor 08/04/2017 11:17 AM

## 2017-08-04 NOTE — Progress Notes (Signed)
PROGRESS NOTE    Morgan Clay  FBP:[id number] DOB: [birth year] DOA: 07/31/2017 PCP: [name], DO    Brief Narrative: Morgan Clay is a 72 y.o. female with medical history significant for NICM (last EF 45%) s/p CRT-D, A. fib on Eliquis with recent DCCV on 3/19, OSA on CPAP, HCV status post Harvoni (viremic response not documented), hypothyroidism, depression and diet managed type 2 diabetes mellitus who presents to the ED with 5 days of diffuse, generalized abdominal pain, with associated anorexia, nausea and one episode of profuse nonbloody, nonbilious emesis on the day of presentation.  CT abdomen and pelvis without contrast showed Suspected mild acute diverticulitis involving the proximal sigmoid colon. Scattered small bowel air-fluid levels and with questionable wall thickening of a few left-sided small bowel loops, query enteritis.  Bilateral lower lobe lung opacities compatible with pneumonia.    Assessment & Plan:   Active Problems:   Hypothyroidism   Obesity   OBSTRUCTIVE SLEEP APNEA   Type 2 diabetes, controlled, with renal manifestation (HCC)   Chronic kidney disease - followed by [facility]   Chronic obstructive pulmonary disease ([place])   Hypertension associated with diabetes ([place])   Persistent atrial fibrillation (HCC)   Diverticulitis  Acute sigmoid diverticulitis and enteritis Was initially on unasyn, changed to zosyn today.  Patient continues to have persistent lower abdominal pain so we will not de-escalate the antibiotics or  advance her diet.  Continue with liquid diet pain control antiemetics as needed and IV fluids. Surgery consulted for persistent abd pain, nausea, and vomiting. Added protonix IV . Repeat CT abdomen and pelvis ordered by surgery for persistent symptoms. Showed small bowel loops dilatation.   Repeat lactate is normal.  Wbc count continues to be wnl. Recommended oob    Acute renal failure patient's baseline creatinine appears to be  around 1.5,  Admitted with a creatinine around 2, since admission creatinine worsening and currently is 3.7 at this time.  Holding Entresto, Aldactone and Lasix since admission ,  avoid nephrotoxins.  Strict in and out and daily weights.  Continue to monitor creatinine. Started her on IV fluids NS 142ml/ hr and recheck creatinine in am. UA is negative for infection.  Suspect prerenal as patient has been hypotensive, dehydrated with decreased urine output.  Nephrology consulted for recommendations. Appreciate their assistance.    Chronic systolic heart failure patient is slightly dehydrated we will hold the Lasix Aldactone and Entresto.  Resume the rest of the home medications as ordered, Coreg.  Elevated troponin's  Sec to demand ischemia. She denies any chest pain.   Chronic atrial fibrillation rate controlled , eliquis discontinued and started her lovenox inj because of her renal parameters.    Hypothyroidism resume Synthroid.   Obstructive sleep apnea on CPAP at night continue the same.   History of hepatitis C status post Harwani.  Outpatient follow-up with PCP..   Diabetes mellitus  CBG (last 3)  Recent Labs    08/04/17 0806 08/04/17 1152 08/04/17 1725  GLUCAP 101* 88 93   Well-controlled. Diet controlled. Resume SSI.  No changes to medications.    DVT prophylaxis: lovenox.  Code Status: Full code Family Communication: No family at bedside Disposition Plan: Possibly home once sbo resolved.   Consultants:   Surgery.  Nephrology  Procedures: None  Antimicrobials: zosyn.   Subjective: Nausea, vomiting persistent, abd pain better.   Objective: Vitals:   08/03/17 1248 08/03/17 2058 08/04/17 0726 08/04/17 1423  BP: (!) 106/56 (!) 169/55  (!) 121/53  Pulse: 60 62  (!) 59  Resp: 16 15  16   Temp: (!) 97.4 F (36.3 C) 98.2 F (36.8 C)  98.3 F (36.8 C)  TempSrc: Oral Oral  Oral  SpO2: 95% 100%  100%  Weight:   73.3 kg (161 lb 11.2 oz)   Height:         Intake/Output Summary (Last 24 hours) at 08/04/2017 1913 Last data filed at 08/04/2017 1835 Gross per 24 hour  Intake 2690 ml  Output 550 ml  Net 2140 ml   Filed Weights   08/02/17 0527 08/03/17 0612 08/04/17 0726  Weight: 68.9 kg (151 lb 14.4 oz) 70.8 kg (156 lb 1.4 oz) 73.3 kg (161 lb 11.2 oz)    Examination:  General exam: Continues to be in mild distress from abdominal pain Respiratory system: Clear no wheezing or rhonchi Cardiovascular system: S1 & S2 heard, RRR. No JVD, murmurs . No pedal edema. Gastrointestinal system: Abdomen is soft, distended, bowel sounds diminished, tender generalized.  Central nervous system: Alert and oriented.  Nonfocal Extremities: Symmetric 5 x 5 power. Skin: No rashes, lesions or ulcers Psychiatry:  Mood & affect appropriate.     Data Reviewed: I have personally reviewed following labs and imaging studies  CBC: Recent Labs  Lab 07/31/17 1229 08/01/17 0237 08/03/17 0323  WBC 11.9* 14.9* 9.8  HGB 14.8 13.3 11.2*  HCT 43.9 40.0 34.4*  MCV 88.5 89.3 89.1  PLT 188 174 782   Basic Metabolic Panel: Recent Labs  Lab 07/31/17 1229 07/31/17 2115 08/01/17 0237 08/02/17 0944 08/03/17 0323 08/04/17 0844  NA 136  --  137 139 135 140  K 4.4  --  4.4 4.2 4.2 4.6  CL 102  --  104 104 103 112*  CO2 22  --  21* 19* 22 17*  GLUCOSE 140*  --  112* 120* 105* 90  BUN 54*  --  51* 68* 70* 73*  CREATININE 2.19*  --  2.12* 2.51* 3.30* 3.77*  CALCIUM 9.6  --  9.0 9.3 8.4* 8.3*  MG  --  2.0 2.1  --   --   --   PHOS  --  3.3 3.4  --   --   --    GFR: Estimated Creatinine Clearance: 12.3 mL/min (A) (by C-G formula based on SCr of 3.77 mg/dL (H)). Liver Function Tests: Recent Labs  Lab 07/31/17 1537  AST 16  ALT 13*  ALKPHOS 69  BILITOT 1.0  PROT 9.3*  ALBUMIN 4.6   Recent Labs  Lab 07/31/17 1537  LIPASE 25   No results for input(s): AMMONIA in the last 168 hours. Coagulation Profile: Recent Labs  Lab 07/31/17 1537  INR 1.37    Cardiac Enzymes: Recent Labs  Lab 07/31/17 1537 07/31/17 2115 08/01/17 0237 08/01/17 0715  TROPONINI 0.04* 0.05* 0.05* 0.05*   BNP (last 3 results) No results for input(s): PROBNP in the last 8760 hours. HbA1C: No results for input(s): HGBA1C in the last 72 hours. CBG: Recent Labs  Lab 08/03/17 1612 08/03/17 2101 08/04/17 0806 08/04/17 1152 08/04/17 1725  GLUCAP 117* 99 101* 88 93   Lipid Profile: No results for input(s): CHOL, HDL, LDLCALC, TRIG, CHOLHDL, LDLDIRECT in the last 72 hours. Thyroid Function Tests: No results for input(s): TSH, T4TOTAL, FREET4, T3FREE, THYROIDAB in the last 72 hours. Anemia Panel: No results for input(s): VITAMINB12, FOLATE, FERRITIN, TIBC, IRON, RETICCTPCT in the last 72 hours. Sepsis Labs: Recent Labs  Lab 07/31/17 1546 08/03/17 1116  LATICACIDVEN  0.90 0.7    No results found for this or any previous visit (from the past 240 hour(s)).       Radiology Studies: Ct Abdomen Pelvis Wo Contrast  Result Date: 08/03/2017 CLINICAL DATA:  Lower abdominal pain. EXAM: CT ABDOMEN AND PELVIS WITHOUT CONTRAST TECHNIQUE: Multidetector CT imaging of the abdomen and pelvis was performed following the standard protocol without IV contrast. COMPARISON:  CT scan of July 31, 2017. FINDINGS: Lower chest: Mild bibasilar subsegmental atelectasis or pneumonia is noted. Hepatobiliary: No gallstones are noted. No focal hepatic lesion is noted. Mildly nodular hepatic margins are noted suggesting cirrhosis. Pancreas: Unremarkable. No pancreatic ductal dilatation or surrounding inflammatory changes. Spleen: Normal in size without focal abnormality. Adrenals/Urinary Tract: Adrenal glands are unremarkable. Kidneys are normal, without renal calculi, focal lesion, or hydronephrosis. Bladder is unremarkable. Stomach/Bowel: The stomach appears normal. Moderate small bowel dilatation is noted concerning for ileus or distal small bowel obstruction. Proximal sigmoid  diverticulitis is again noted. Vascular/Lymphatic: Aortic atherosclerosis. No enlarged abdominal or pelvic lymph nodes. Reproductive: Large calcified uterine fibroid is noted. No adnexal abnormality is noted. Other: No abnormal fluid collection is noted. Mild fluid-filled left inguinal hernia is noted. Musculoskeletal: No acute or significant osseous findings. IMPRESSION: Interval development of moderate small bowel dilatation is noted concerning for ileus or distal small bowel obstruction. Proximal sigmoid diverticulitis is again noted. Mildly nodular hepatic margins are noted suggesting hepatic cirrhosis. Large calcified uterine fibroid is noted. Mild bibasilar subsegmental atelectasis or pneumonia is noted. Aortic Atherosclerosis (ICD10-I70.0). Electronically Signed   By: Marijo Conception, M.D.   On: 08/03/2017 17:16   US Renal  Result Date: 08/03/2017 CLINICAL DATA:  Acute renal failure, history hypertension, hyperlipidemia, CHF, non ischemic cardiomyopathy EXAM: RENAL / URINARY TRACT ULTRASOUND COMPLETE COMPARISON:  CT abdomen and pelvis 07/31/2017 FINDINGS: Right Kidney: Length: 9.5 cm. Normal cortical thickness. Increased cortical echogenicity. No mass, hydronephrosis, or or shadowing calcification. Left Kidney: Length: 10.6 cm. Normal cortical thickness. Increased cortical echogenicity. No mass, hydronephrosis, or or shadowing calcification. Bladder: Appears normal for degree of bladder distention. BILATERAL ureteral jets noted. IMPRESSION: Medical renal disease changes of the kidneys. Electronically Signed   By: Lavonia [name] M.D.   On: 08/03/2017 15:58        Scheduled Meds: . amiodarone  200 mg Oral Daily  . carvedilol  12.5 mg Oral BID WC  . enoxaparin (LOVENOX) injection  70 mg Subcutaneous Q24H  . insulin aspart  0-5 Units Subcutaneous QHS  . insulin aspart  0-9 Units Subcutaneous TID WC  . isosorbide-hydrALAZINE  1 tablet Oral BID  . levothyroxine  125 mcg Oral QAC breakfast  .  pantoprazole (PROTONIX) IV  40 mg Intravenous Q24H  . sertraline  50 mg Oral Daily   Continuous Infusions: . sodium chloride 75 mL/hr at 08/04/17 1524  . piperacillin-tazobactam (ZOSYN)  IV 2.25 g (08/04/17 1837)     LOS: 4 days    Time spent: 35 minutes.     Hosie Poisson, MD [facility] Pager [phone]  If 7PM-7AM, please contact night-coverage [email or web address] Password [facility] 08/04/2017, 7:13 PM

## 2017-08-05 LAB — GLUCOSE, CAPILLARY
GLUCOSE-CAPILLARY: 89 mg/dL (ref 65–99)
GLUCOSE-CAPILLARY: 74 mg/dL (ref 65–99)
Glucose-Capillary: 77 mg/dL (ref 65–99)
Glucose-Capillary: 72 mg/dL (ref 65–99)

## 2017-08-05 LAB — CBC
HEMATOCRIT: 34.8 % — AB (ref 36.0–46.0)
Hemoglobin: 11.1 g/dL — ABNORMAL LOW (ref 12.0–15.0)
MCH: 28.5 pg (ref 26.0–34.0)
MCHC: 31.9 g/dL (ref 30.0–36.0)
MCV: 89.5 fL (ref 78.0–100.0)
Platelets: 184 10*3/uL (ref 150–400)
RBC: 3.89 MIL/uL (ref 3.87–5.11)
RDW: 15.5 % (ref 11.5–15.5)
WBC: 5.9 10*3/uL (ref 4.0–10.5)

## 2017-08-05 LAB — BASIC METABOLIC PANEL
ANION GAP: 14 (ref 5–15)
BUN: 68 mg/dL — AB (ref 6–20)
CHLORIDE: 107 mmol/L (ref 101–111)
CO2: 14 mmol/L — ABNORMAL LOW (ref 22–32)
Calcium: 8.4 mg/dL — ABNORMAL LOW (ref 8.9–10.3)
Creatinine, Ser: 3.34 mg/dL — ABNORMAL HIGH (ref 0.44–1.00)
GFR calc Af Amer: 15 mL/min — ABNORMAL LOW (ref >60)
GFR calc non Af Amer: 13 mL/min — ABNORMAL LOW (ref >60)
GLUCOSE: 81 mg/dL (ref 65–99)
POTASSIUM: 4 mmol/L (ref 3.5–5.1)
Sodium: 135 mmol/L (ref 135–145)

## 2017-08-05 MED ORDER — SODIUM CHLORIDE 0.9 % IV SOLN
INTRAVENOUS | Status: DC
  Administered 2017-08-05: 1000 mL via INTRAVENOUS

## 2017-08-05 NOTE — Progress Notes (Signed)
Morgan Clay Progress Note    Assessment/ Plan:   1.  AKI on CKD III: Likely hemodynamically mediated in setting of poor PO intake, Entresto, Lasix/ aldactone.  She is nonoliguric and she has no metabolic abnormalities.  Avoid NSAIDs, IV contrast, fleets enemas, mag citrate, PPIs.  Renal US showing medicorenal disease.  Her creatinine peaked at 3.7 and is now trending down.  I/O not accurate.    2.  Diverticulitis: on Zosyn.  CT without abscess or perf.  CCS following.  Zofran changed to phenergan  3.  NICM: hold ACEi and diuretics.  Continuing carvedilol, hydral, and BiDil.  Pressures are a little low- have decreased carvedilol to 12.5 BID for now.  4.  Afib: s/p DCCV 3/19.  On Eliquis 2.5 mg BID as OP--> now Lovenox per pharm c/s and amiodarone  5.  Dispo: still not eating/ drinking  Subjective:    Still having abd pain and loose stools.  Not eating much.   Objective:   BP (!) 150/64 (BP Location: Right Arm)   Pulse 62   Temp 98 F (36.7 C) (Oral)   Resp 15   Ht 4\' 11"  (1.499 m)   Wt 74.2 kg (163 lb 9.3 oz)   SpO2 100%   BMI 33.04 kg/m   Intake/Output Summary (Last 24 hours) at 08/05/2017 1345 Last data filed at 08/05/2017 0844 Gross per 24 hour  Intake 5865.33 ml  Output 775 ml  Net 5090.33 ml   Weight change:   Physical Exam: GEN NAD, lying in bed HEENT EOMI, PERRL NECK flat neck veins PULM clear bilaterally no c/w/r CV RRR soft systolic murmur ABD obese, slightly distended, hypoactive bowel sounds, no rebound/ guarding, tender diffusely EXT no LE edema NEURO nonfocal SKIN no rashes or lesions    Imaging: Ct Abdomen Pelvis Wo Contrast  Result Date: 08/03/2017 CLINICAL DATA:  Lower abdominal pain. EXAM: CT ABDOMEN AND PELVIS WITHOUT CONTRAST TECHNIQUE: Multidetector CT imaging of the abdomen and pelvis was performed following the standard protocol without IV contrast. COMPARISON:  CT scan of July 31, 2017. FINDINGS: Lower chest: Mild  bibasilar subsegmental atelectasis or pneumonia is noted. Hepatobiliary: No gallstones are noted. No focal hepatic lesion is noted. Mildly nodular hepatic margins are noted suggesting cirrhosis. Pancreas: Unremarkable. No pancreatic ductal dilatation or surrounding inflammatory changes. Spleen: Normal in size without focal abnormality. Adrenals/Urinary Tract: Adrenal glands are unremarkable. Kidneys are normal, without renal calculi, focal lesion, or hydronephrosis. Bladder is unremarkable. Stomach/Bowel: The stomach appears normal. Moderate small bowel dilatation is noted concerning for ileus or distal small bowel obstruction. Proximal sigmoid diverticulitis is again noted. Vascular/Lymphatic: Aortic atherosclerosis. No enlarged abdominal or pelvic lymph nodes. Reproductive: Large calcified uterine fibroid is noted. No adnexal abnormality is noted. Other: No abnormal fluid collection is noted. Mild fluid-filled left inguinal hernia is noted. Musculoskeletal: No acute or significant osseous findings. IMPRESSION: Interval development of moderate small bowel dilatation is noted concerning for ileus or distal small bowel obstruction. Proximal sigmoid diverticulitis is again noted. Mildly nodular hepatic margins are noted suggesting hepatic cirrhosis. Large calcified uterine fibroid is noted. Mild bibasilar subsegmental atelectasis or pneumonia is noted. Aortic Atherosclerosis (ICD10-I70.0). Electronically Signed   By: Marijo Conception, M.D.   On: 08/03/2017 17:16   US Renal  Result Date: 08/03/2017 CLINICAL DATA:  Acute renal failure, history hypertension, hyperlipidemia, CHF, non ischemic cardiomyopathy EXAM: RENAL / URINARY TRACT ULTRASOUND COMPLETE COMPARISON:  CT abdomen and pelvis 07/31/2017 FINDINGS: Right Kidney: Length: 9.5 cm. Normal cortical  thickness. Increased cortical echogenicity. No mass, hydronephrosis, or or shadowing calcification. Left Kidney: Length: 10.6 cm. Normal cortical thickness. Increased  cortical echogenicity. No mass, hydronephrosis, or or shadowing calcification. Bladder: Appears normal for degree of bladder distention. BILATERAL ureteral jets noted. IMPRESSION: Medical renal disease changes of the kidneys. Electronically Signed   By: Lavonia [name] M.D.   On: 08/03/2017 15:58    Labs: BMET Recent Labs  Lab 07/31/17 1229 07/31/17 2115 08/01/17 0237 08/02/17 0944 08/03/17 0323 08/04/17 0844 08/05/17 0339  NA 136  --  137 139 135 140 135  K 4.4  --  4.4 4.2 4.2 4.6 4.0  CL 102  --  104 104 103 112* 107  CO2 22  --  21* 19* 22 17* 14*  GLUCOSE 140*  --  112* 120* 105* 90 81  BUN 54*  --  51* 68* 70* 73* 68*  CREATININE 2.19*  --  2.12* 2.51* 3.30* 3.77* 3.34*  CALCIUM 9.6  --  9.0 9.3 8.4* 8.3* 8.4*  PHOS  --  3.3 3.4  --   --   --   --    CBC Recent Labs  Lab 07/31/17 1229 08/01/17 0237 08/03/17 0323 08/05/17 0339  WBC 11.9* 14.9* 9.8 5.9  HGB 14.8 13.3 11.2* 11.1*  HCT 43.9 40.0 34.4* 34.8*  MCV 88.5 89.3 89.1 89.5  PLT 188 174 179 184    Medications:    . amiodarone  200 mg Oral Daily  . carvedilol  12.5 mg Oral BID WC  . enoxaparin (LOVENOX) injection  70 mg Subcutaneous Q24H  . insulin aspart  0-5 Units Subcutaneous QHS  . insulin aspart  0-9 Units Subcutaneous TID WC  . isosorbide-hydrALAZINE  1 tablet Oral BID  . levothyroxine  125 mcg Oral QAC breakfast  . pantoprazole (PROTONIX) IV  40 mg Intravenous Q24H  . sertraline  50 mg Oral Daily      Morgan Lips, MD [place] pgr [phone] 08/05/2017, 1:45 PM

## 2017-08-05 NOTE — Progress Notes (Addendum)
Pt asked to get up and walk again or sit up in chair for a while. She refused states maybe later. Rates abdominal pain  #8, medicated  with 2 mg Morphine IV and given a warm pack for her abdomen. She did walk a short distance this AM and sat up in chair for a couple hours. Passing a lot of fluctuance. Family in to visit. Unable to get urine specimen because it's mixed with stool (Green Bile). Pt able to reposition self.

## 2017-08-05 NOTE — Progress Notes (Signed)
PROGRESS NOTE    Morgan Clay  DJS:[id number] DOB: Sep 22, 1945 DOA: 07/31/2017 PCP: [name], DO    Brief Narrative: Morgan Clay is a 72 y.o. female with medical history significant for NICM (last EF 45%) s/p CRT-D, A. fib on Eliquis with recent DCCV on 3/19, OSA on CPAP, HCV status post Harvoni (viremic response not documented), hypothyroidism, depression and diet managed type 2 diabetes mellitus who presents to the ED with 5 days of diffuse, generalized abdominal pain, with associated anorexia, nausea and one episode of profuse nonbloody, nonbilious emesis on the day of presentation.  CT abdomen and pelvis without contrast showed Suspected mild acute diverticulitis involving the proximal sigmoid colon. Scattered small bowel air-fluid levels and with questionable wall thickening of a few left-sided small bowel loops, query enteritis.  Bilateral lower lobe lung opacities compatible with pneumonia.    Assessment & Plan:   Active Problems:   Hypothyroidism   Obesity   OBSTRUCTIVE SLEEP APNEA   Type 2 diabetes, controlled, with renal manifestation (HCC)   Chronic kidney disease - followed by [facility]   Chronic obstructive pulmonary disease ([place])   Hypertension associated with diabetes ([place])   Persistent atrial fibrillation (HCC)   Diverticulitis  Acute sigmoid diverticulitis and enteritis Was initially on unasyn, changed to zosyn .  Patient continues to have persistent lower abdominal pain so we will not de-escalate the antibiotics or  advance her diet.  Continue with liquid diet, pain control,  antiemetics as needed and IV fluids. Surgery consulted for persistent abd pain, nausea, and vomiting. Added protonix IV . Repeat CT abdomen and pelvis ordered by surgery for persistent symptoms. Showed small bowel loops dilatation.   Repeat lactate is normal.  Wbc count continues to be wnl. Recommended oob.  She reports loose BM the last 24 hours..  Recommend to continue with  IV fluids, IV zosyn, clear liquids, anti emetics.     Acute renal failure patient's baseline creatinine appears to be around 1.5,  Admitted with a creatinine around 2, slowly worsened to 3.7 , now it has started to improve with fluids. Today's creatinine is 3.4. Holding Entresto, Aldactone and Lasix since admission ,  avoid nephrotoxins.  Strict in and out and daily weights.  Continue to monitor creatinine. Started her on IV fluids NS 150ml/ hr and recheck creatinine in am. UA is negative for infection.  Suspect prerenal as patient has been hypotensive, dehydrated with decreased urine output.  Nephrology consulted for recommendations. Appreciate their assistance.    Chronic systolic heart failure patient is slightly dehydrated we will hold the Lasix Aldactone and Entresto.  Resume the rest of the home medications as ordered, Coreg.  Elevated troponin's  Sec to demand ischemia. She denies any chest pain.   Chronic atrial fibrillation rate controlled , eliquis discontinued and started her lovenox inj because of her renal parameters.    Hypothyroidism resume Synthroid.   Obstructive sleep apnea on CPAP at night continue the same.   History of hepatitis C status post Harwani.  Outpatient follow-up with PCP..   Diabetes mellitus  CBG (last 3)  Recent Labs    08/04/17 1725 08/04/17 2105 08/05/17 0750  GLUCAP 93 91 77   Well-controlled. Diet controlled. Resume SSI.  No changes to medications.  Mild anemia normocytic.  Her baseline hemoglobin around 12. Currently its 11. No signs of bleeding.   DVT prophylaxis: lovenox.  Code Status: Full code Family Communication: No family at bedside Disposition Plan: Possibly home once sbo and diverticulitis  resolved.   Consultants:   Surgery.  Nephrology  Procedures: None  Antimicrobials: zosyn.   Subjective: Multiple bowel movements int he last 24 hours.  Persistent nausea, no vomiting, abd pain.    Objective: Vitals:    08/04/17 1423 08/04/17 2100 08/05/17 0503 08/05/17 0737  BP: (!) 121/53 (!) 141/54 (!) 150/64   Pulse: (!) 59 63 62   Resp: 16 16 15    Temp: 98.3 F (36.8 C) 98.4 F (36.9 C) 98 F (36.7 C)   TempSrc: Oral Oral Oral   SpO2: 100% 100% 100%   Weight:    74.2 kg (163 lb 9.3 oz)  Height:        Intake/Output Summary (Last 24 hours) at 08/05/2017 1032 Last data filed at 08/05/2017 0844 Gross per 24 hour  Intake 5865.33 ml  Output 975 ml  Net 4890.33 ml   Filed Weights   08/03/17 0612 08/04/17 0726 08/05/17 0737  Weight: 70.8 kg (156 lb 1.4 oz) 73.3 kg (161 lb 11.2 oz) 74.2 kg (163 lb 9.3 oz)    Examination:  General exam: comfortable today. Not in distress.  Respiratory system: diminished at bases.  Cardiovascular system: S1 & S2 heard, RRR. No JVD, murmurs . No pedal edema. Gastrointestinal system: Abdomen is of mildly tender in the lower quadrants more . bowel sounds good.  Central nervous system: Alert and oriented.  Nonfocal Extremities: no edema, cyanosis or clubbing.  Skin: No rashes, lesions or ulcers Psychiatry:  Mood & affect appropriate.     Data Reviewed: I have personally reviewed following labs and imaging studies  CBC: Recent Labs  Lab 07/31/17 1229 08/01/17 0237 08/03/17 0323 08/05/17 0339  WBC 11.9* 14.9* 9.8 5.9  HGB 14.8 13.3 11.2* 11.1*  HCT 43.9 40.0 34.4* 34.8*  MCV 88.5 89.3 89.1 89.5  PLT 188 174 179 174   Basic Metabolic Panel: Recent Labs  Lab 07/31/17 2115 08/01/17 0237 08/02/17 0944 08/03/17 0323 08/04/17 0844 08/05/17 0339  NA  --  137 139 135 140 135  K  --  4.4 4.2 4.2 4.6 4.0  CL  --  104 104 103 112* 107  CO2  --  21* 19* 22 17* 14*  GLUCOSE  --  112* 120* 105* 90 81  BUN  --  51* 68* 70* 73* 68*  CREATININE  --  2.12* 2.51* 3.30* 3.77* 3.34*  CALCIUM  --  9.0 9.3 8.4* 8.3* 8.4*  MG 2.0 2.1  --   --   --   --   PHOS 3.3 3.4  --   --   --   --    GFR: Estimated Creatinine Clearance: 14 mL/min (A) (by C-G formula based  on SCr of 3.34 mg/dL (H)). Liver Function Tests: Recent Labs  Lab 07/31/17 1537  AST 16  ALT 13*  ALKPHOS 69  BILITOT 1.0  PROT 9.3*  ALBUMIN 4.6   Recent Labs  Lab 07/31/17 1537  LIPASE 25   No results for input(s): AMMONIA in the last 168 hours. Coagulation Profile: Recent Labs  Lab 07/31/17 1537  INR 1.37   Cardiac Enzymes: Recent Labs  Lab 07/31/17 1537 07/31/17 2115 08/01/17 0237 08/01/17 0715  TROPONINI 0.04* 0.05* 0.05* 0.05*   BNP (last 3 results) No results for input(s): PROBNP in the last 8760 hours. HbA1C: No results for input(s): HGBA1C in the last 72 hours. CBG: Recent Labs  Lab 08/04/17 0806 08/04/17 1152 08/04/17 1725 08/04/17 2105 08/05/17 0750  GLUCAP 101* 88 93 91  77   Lipid Profile: No results for input(s): CHOL, HDL, LDLCALC, TRIG, CHOLHDL, LDLDIRECT in the last 72 hours. Thyroid Function Tests: No results for input(s): TSH, T4TOTAL, FREET4, T3FREE, THYROIDAB in the last 72 hours. Anemia Panel: No results for input(s): VITAMINB12, FOLATE, FERRITIN, TIBC, IRON, RETICCTPCT in the last 72 hours. Sepsis Labs: Recent Labs  Lab 07/31/17 1546 08/03/17 1116  LATICACIDVEN 0.90 0.7    No results found for this or any previous visit (from the past 240 hour(s)).       Radiology Studies: Ct Abdomen Pelvis Wo Contrast  Result Date: 08/03/2017 CLINICAL DATA:  Lower abdominal pain. EXAM: CT ABDOMEN AND PELVIS WITHOUT CONTRAST TECHNIQUE: Multidetector CT imaging of the abdomen and pelvis was performed following the standard protocol without IV contrast. COMPARISON:  CT scan of July 31, 2017. FINDINGS: Lower chest: Mild bibasilar subsegmental atelectasis or pneumonia is noted. Hepatobiliary: No gallstones are noted. No focal hepatic lesion is noted. Mildly nodular hepatic margins are noted suggesting cirrhosis. Pancreas: Unremarkable. No pancreatic ductal dilatation or surrounding inflammatory changes. Spleen: Normal in size without focal  abnormality. Adrenals/Urinary Tract: Adrenal glands are unremarkable. Kidneys are normal, without renal calculi, focal lesion, or hydronephrosis. Bladder is unremarkable. Stomach/Bowel: The stomach appears normal. Moderate small bowel dilatation is noted concerning for ileus or distal small bowel obstruction. Proximal sigmoid diverticulitis is again noted. Vascular/Lymphatic: Aortic atherosclerosis. No enlarged abdominal or pelvic lymph nodes. Reproductive: Large calcified uterine fibroid is noted. No adnexal abnormality is noted. Other: No abnormal fluid collection is noted. Mild fluid-filled left inguinal hernia is noted. Musculoskeletal: No acute or significant osseous findings. IMPRESSION: Interval development of moderate small bowel dilatation is noted concerning for ileus or distal small bowel obstruction. Proximal sigmoid diverticulitis is again noted. Mildly nodular hepatic margins are noted suggesting hepatic cirrhosis. Large calcified uterine fibroid is noted. Mild bibasilar subsegmental atelectasis or pneumonia is noted. Aortic Atherosclerosis (ICD10-I70.0). Electronically Signed   By: Marijo Conception, M.D.   On: 08/03/2017 17:16   US Renal  Result Date: 08/03/2017 CLINICAL DATA:  Acute renal failure, history hypertension, hyperlipidemia, CHF, non ischemic cardiomyopathy EXAM: RENAL / URINARY TRACT ULTRASOUND COMPLETE COMPARISON:  CT abdomen and pelvis 07/31/2017 FINDINGS: Right Kidney: Length: 9.5 cm. Normal cortical thickness. Increased cortical echogenicity. No mass, hydronephrosis, or or shadowing calcification. Left Kidney: Length: 10.6 cm. Normal cortical thickness. Increased cortical echogenicity. No mass, hydronephrosis, or or shadowing calcification. Bladder: Appears normal for degree of bladder distention. BILATERAL ureteral jets noted. IMPRESSION: Medical renal disease changes of the kidneys. Electronically Signed   By: Lavonia [name] M.D.   On: 08/03/2017 15:58        Scheduled  Meds: . amiodarone  200 mg Oral Daily  . carvedilol  12.5 mg Oral BID WC  . enoxaparin (LOVENOX) injection  70 mg Subcutaneous Q24H  . insulin aspart  0-5 Units Subcutaneous QHS  . insulin aspart  0-9 Units Subcutaneous TID WC  . isosorbide-hydrALAZINE  1 tablet Oral BID  . levothyroxine  125 mcg Oral QAC breakfast  . pantoprazole (PROTONIX) IV  40 mg Intravenous Q24H  . sertraline  50 mg Oral Daily   Continuous Infusions: . piperacillin-tazobactam (ZOSYN)  IV Stopped (08/05/17 0609)     LOS: 5 days    Time spent: 35 minutes.     Hosie Poisson, MD [facility] Pager [phone]  If 7PM-7AM, please contact night-coverage [email or web address] Password [facility] 08/05/2017, 10:32 AM

## 2017-08-05 NOTE — Progress Notes (Signed)
[facility]:  [phone] General Surgery Progress Note   LOS: 5 days  POD -     Chief Complaint: Abdominal pain  Assessment and Plan: 1.  Sigmoid colon diverticulitis/enteritis  WBC - 5,900 - 08/05/2017  Zosyn  CT scan of abdomen - 08/03/2017 - moderate SB dilatation, sigmoid diverticulitis, nodular liver, calcified uterine fibroid   Minimal abdominal pain.  She says that she has had frequent loose stools.  2.  A Fib 3.  Anticoagulated - on Eliquis 4.  OSA 5.  History of Hep C  CT scan shows nodularity of the liver 6.  DM 7.  CKD/AKD -  Creat - 3.34 - 08/05/2017 8.  Pacemaker - S. Klein 9.  Nodular liver - question cirrhosis   Active Problems:   Hypothyroidism   Obesity   OBSTRUCTIVE SLEEP APNEA   Type 2 diabetes, controlled, with renal manifestation (HCC)   Chronic kidney disease - followed by [facility]   Chronic obstructive pulmonary disease ([place])   Hypertension associated with diabetes ([place])   Persistent atrial fibrillation (HCC)   Diverticulitis   Subjective:  Main complaint has been frequent bowel movements.  Other than going to bathroom, she has not gotten out of the bed much.  Objective:   Vitals:   08/04/17 2100 08/05/17 0503  BP: (!) 141/54 (!) 150/64  Pulse: 63 62  Resp: 16 15  Temp: 98.4 F (36.9 C) 98 F (36.7 C)  SpO2: 100% 100%     Intake/Output from previous day:  03/30 0701 - 03/31 0700 In: 7397 [P.O.:480; I.V.:6667; IV Piggyback:250] Out: 725 [Urine:725]  Intake/Output this shift:  Total I/O In: -  Out: 250 [Urine:250]   Physical Exam:   General: Older AA F who is alert and oriented.    HEENT: Normal. Pupils equal. .   Lungs: Clear   Abdomen: Mild distention, but has good BS.  She has no localized tenderness on my exam.   Lab Results:    Recent Labs    08/03/17 0323 08/05/17 0339  WBC 9.8 5.9  HGB 11.2* 11.1*  HCT 34.4* 34.8*  PLT 179 184    BMET   Recent Labs    08/04/17 0844  08/05/17 0339  NA 140 135  K 4.6 4.0  CL 112* 107  CO2 17* 14*  GLUCOSE 90 81  BUN 73* 68*  CREATININE 3.77* 3.34*  CALCIUM 8.3* 8.4*    PT/INR  No results for input(s): LABPROT, INR in the last 72 hours.  ABG  No results for input(s): PHART, HCO3 in the last 72 hours.  Invalid input(s): PCO2, PO2   Studies/Results:  Ct Abdomen Pelvis Wo Contrast  Result Date: 08/03/2017 CLINICAL DATA:  Lower abdominal pain. EXAM: CT ABDOMEN AND PELVIS WITHOUT CONTRAST TECHNIQUE: Multidetector CT imaging of the abdomen and pelvis was performed following the standard protocol without IV contrast. COMPARISON:  CT scan of July 31, 2017. FINDINGS: Lower chest: Mild bibasilar subsegmental atelectasis or pneumonia is noted. Hepatobiliary: No gallstones are noted. No focal hepatic lesion is noted. Mildly nodular hepatic margins are noted suggesting cirrhosis. Pancreas: Unremarkable. No pancreatic ductal dilatation or surrounding inflammatory changes. Spleen: Normal in size without focal abnormality. Adrenals/Urinary Tract: Adrenal glands are unremarkable. Kidneys are normal, without renal calculi, focal lesion, or hydronephrosis. Bladder is unremarkable. Stomach/Bowel: The stomach appears normal. Moderate small bowel dilatation is noted concerning for ileus or distal small bowel obstruction. Proximal sigmoid diverticulitis is again noted. Vascular/Lymphatic: Aortic atherosclerosis. No enlarged abdominal or pelvic  lymph nodes. Reproductive: Large calcified uterine fibroid is noted. No adnexal abnormality is noted. Other: No abnormal fluid collection is noted. Mild fluid-filled left inguinal hernia is noted. Musculoskeletal: No acute or significant osseous findings. IMPRESSION: Interval development of moderate small bowel dilatation is noted concerning for ileus or distal small bowel obstruction. Proximal sigmoid diverticulitis is again noted. Mildly nodular hepatic margins are noted suggesting hepatic cirrhosis. Large  calcified uterine fibroid is noted. Mild bibasilar subsegmental atelectasis or pneumonia is noted. Aortic Atherosclerosis (ICD10-I70.0). Electronically Signed   By: Marijo Conception, M.D.   On: 08/03/2017 17:16   US Renal  Result Date: 08/03/2017 CLINICAL DATA:  Acute renal failure, history hypertension, hyperlipidemia, CHF, non ischemic cardiomyopathy EXAM: RENAL / URINARY TRACT ULTRASOUND COMPLETE COMPARISON:  CT abdomen and pelvis 07/31/2017 FINDINGS: Right Kidney: Length: 9.5 cm. Normal cortical thickness. Increased cortical echogenicity. No mass, hydronephrosis, or or shadowing calcification. Left Kidney: Length: 10.6 cm. Normal cortical thickness. Increased cortical echogenicity. No mass, hydronephrosis, or or shadowing calcification. Bladder: Appears normal for degree of bladder distention. BILATERAL ureteral jets noted. IMPRESSION: Medical renal disease changes of the kidneys. Electronically Signed   By: Lavonia [name] M.D.   On: 08/03/2017 15:58     Anti-infectives:   Anti-infectives (From admission, onward)   Start     Dose/Rate Route Frequency Ordered Stop   08/02/17 1800  piperacillin-tazobactam (ZOSYN) IVPB 2.25 g     2.25 g 100 mL/hr over 30 Minutes Intravenous Every 6 hours 08/02/17 1737     07/31/17 2200  Ampicillin-Sulbactam (UNASYN) 3 g in sodium chloride 0.9 % 100 mL IVPB  Status:  Discontinued     3 g 200 mL/hr over 30 Minutes Intravenous Every 12 hours 07/31/17 2132 08/02/17 1708   07/31/17 1830  cefTRIAXone (ROCEPHIN) 2 g in sodium chloride 0.9 % 100 mL IVPB     2 g 200 mL/hr over 30 Minutes Intravenous  Once 07/31/17 1822 07/31/17 1919   07/31/17 1830  metroNIDAZOLE (FLAGYL) IVPB 500 mg  Status:  Discontinued     500 mg 100 mL/hr over 60 Minutes Intravenous  Once 07/31/17 1822 07/31/17 2053   07/31/17 1830  doxycycline (VIBRAMYCIN) 100 mg in sodium chloride 0.9 % 250 mL IVPB  Status:  Discontinued     100 mg 125 mL/hr over 120 Minutes Intravenous  Once 07/31/17 1822  07/31/17 2053      Alphonsa Overall, MD, FACS Pager: [facility]: [phone] 08/05/2017

## 2017-08-06 ENCOUNTER — Inpatient Hospital Stay (HOSPITAL_COMMUNITY): Payer: Medicare Other

## 2017-08-06 ENCOUNTER — Other Ambulatory Visit: Payer: Medicare Other

## 2017-08-06 LAB — CREATININE, SERUM
Creatinine, Ser: 2.42 mg/dL — ABNORMAL HIGH (ref 0.44–1.00)
GFR, EST AFRICAN AMERICAN: 22 mL/min — AB (ref >60)
GFR, EST NON AFRICAN AMERICAN: 19 mL/min — AB (ref >60)

## 2017-08-06 LAB — C DIFFICILE QUICK SCREEN W PCR REFLEX
C DIFFICILE (CDIFF) INTERP: NOT DETECTED
C Diff antigen: NEGATIVE
C Diff toxin: NEGATIVE

## 2017-08-06 LAB — GLUCOSE, CAPILLARY
GLUCOSE-CAPILLARY: 79 mg/dL (ref 65–99)
Glucose-Capillary: 94 mg/dL (ref 65–99)
Glucose-Capillary: 101 mg/dL — ABNORMAL HIGH (ref 65–99)
Glucose-Capillary: 87 mg/dL (ref 65–99)

## 2017-08-06 NOTE — Progress Notes (Signed)
Patient ID: Morgan Clay, female   DOB: 05-Dec-1945, 72 y.o.   MRN: [id number]       Subjective: Pt doesn't feel well.  Feels nauseated, but having a lot of liquid bilious BMs.  At least 8 documented yesterday.  Complains of diffuse abdominal pain.  Objective: Vital signs in last 24 hours: Temp:  [97.6 F (36.4 C)-98.3 F (36.8 C)] 98.3 F (36.8 C) (04/01 0405) Pulse Rate:  [64-67] 67 (04/01 0405) Resp:  [16-18] 16 (04/01 0405) BP: (128-184)/(67-77) 128/67 (04/01 0405) SpO2:  [97 %-100 %] 100 % (04/01 0405) Weight:  [163 lb 9.3 oz (74.2 kg)] 163 lb 9.3 oz (74.2 kg) (04/01 0553) Last BM Date: 08/05/17  Intake/Output from previous day: 03/31 0701 - 04/01 0700 In: 2190 [P.O.:340; I.V.:1650; IV Piggyback:200] Out: 353 [Urine:350; Stool:3] Intake/Output this shift: No intake/output data recorded.  PE: Heart: regular Lungs: CTAB Abd: soft, but distended, diffusely tender to palpation, but no guarding or peritoneal signs.  Greatest in mid and LLQ.  +BS  Lab Results:  Recent Labs    08/05/17 0339  WBC 5.9  HGB 11.1*  HCT 34.8*  PLT 184   BMET Recent Labs    08/04/17 0844 08/05/17 0339 08/06/17 0404  NA 140 135  --   K 4.6 4.0  --   CL 112* 107  --   CO2 17* 14*  --   GLUCOSE 90 81  --   BUN 73* 68*  --   CREATININE 3.77* 3.34* 2.42*  CALCIUM 8.3* 8.4*  --    PT/INR No results for input(s): LABPROT, INR in the last 72 hours. CMP     Component Value Date/Time   NA 135 08/05/2017 0339   NA 140 07/19/2017 1503   K 4.0 08/05/2017 0339   CL 107 08/05/2017 0339   CO2 14 (L) 08/05/2017 0339   GLUCOSE 81 08/05/2017 0339   BUN 68 (H) 08/05/2017 0339   BUN 38 (H) 07/19/2017 1503   CREATININE 2.42 (H) 08/06/2017 0404   CREATININE 1.26 (H) 02/25/2016 1607   CALCIUM 8.4 (L) 08/05/2017 0339   PROT 9.3 (H) 07/31/2017 1537   PROT 8.3 07/19/2017 1503   ALBUMIN 4.6 07/31/2017 1537   ALBUMIN 4.9 (H) 07/19/2017 1503   AST 16 07/31/2017 1537   ALT 13 (L) 07/31/2017  1537   ALKPHOS 69 07/31/2017 1537   BILITOT 1.0 07/31/2017 1537   BILITOT 0.3 07/19/2017 1503   GFRNONAA 19 (L) 08/06/2017 0404   GFRAA 22 (L) 08/06/2017 0404   Lipase     Component Value Date/Time   LIPASE 25 07/31/2017 1537       Studies/Results: No results found.  Anti-infectives: Anti-infectives (From admission, onward)   Start     Dose/Rate Route Frequency Ordered Stop   08/02/17 1800  piperacillin-tazobactam (ZOSYN) IVPB 2.25 g     2.25 g 100 mL/hr over 30 Minutes Intravenous Every 6 hours 08/02/17 1737     07/31/17 2200  Ampicillin-Sulbactam (UNASYN) 3 g in sodium chloride 0.9 % 100 mL IVPB  Status:  Discontinued     3 g 200 mL/hr over 30 Minutes Intravenous Every 12 hours 07/31/17 2132 08/02/17 1708   07/31/17 1830  cefTRIAXone (ROCEPHIN) 2 g in sodium chloride 0.9 % 100 mL IVPB     2 g 200 mL/hr over 30 Minutes Intravenous  Once 07/31/17 1822 07/31/17 1919   07/31/17 1830  metroNIDAZOLE (FLAGYL) IVPB 500 mg  Status:  Discontinued     500 mg 100  mL/hr over 60 Minutes Intravenous  Once 07/31/17 1822 07/31/17 2053   07/31/17 1830  doxycycline (VIBRAMYCIN) 100 mg in sodium chloride 0.9 % 250 mL IVPB  Status:  Discontinued     100 mg 125 mL/hr over 120 Minutes Intravenous  Once 07/31/17 1822 07/31/17 2053       Assessment/Plan afib on Eliquis-s/p recent cardioversion Hypothyroidism  OSA on CPAP  History of Hep C s/p Harwani- CT 3/26 w/ nodularity of liver, pt Hx of white stools Uterine fibroids Dibetes Mellitus  AKI w/ CKD - SCr increased today, GFR 15 Lower lobe PNA?- per medicine Leukocytosis - resolved   Abdominal pain Colitis/enteritis unk etiology - WBC normal yesterday -multiple liquid BMs. Will check C diff to rule out other source of colitis -repeat plain films secondary to small bowel distention on previous CT scan and persistent distention and nausea. -cont zosyn  FEN: clear liquids, would not advance diet, IVFs ID: Unasyn 3/26-3/28,  Zosyn 3/28>>  VTE: SCD's, heparin  Foley: none    LOS: 6 days    Henreitta Cea , [facility] 08/06/2017, 8:07 AM Pager: [phone]

## 2017-08-06 NOTE — Discharge Instructions (Signed)
Please arrive at least 30 min before your appointment to complete your check in paperwork.  If you are unable to arrive 30 min prior to your appointment time we may have to cancel or reschedule you. ° °LAPAROSCOPIC SURGERY: POST OP INSTRUCTIONS  °1. DIET: Follow a light bland diet the first 24 hours after arrival home, such as soup, liquids, crackers, etc. Be sure to include lots of fluids daily. Avoid fast food or heavy meals as your are more likely to get nauseated. Eat a low fat the next few days after surgery.  °2. Take your usually prescribed home medications unless otherwise directed. °3. PAIN CONTROL:  °1. Pain is best controlled by a usual combination of three different methods TOGETHER:  °1. Ice/Heat °2. Over the counter pain medication °3. Prescription pain medication °2. Most patients will experience some swelling and bruising around the incisions. Ice packs or heating pads (30-60 minutes up to 6 times a day) will help. Use ice for the first few days to help decrease swelling and bruising, then switch to heat to help relax tight/sore spots and speed recovery. Some people prefer to use ice alone, heat alone, alternating between ice & heat. Experiment to what works for you. Swelling and bruising can take several weeks to resolve.  °3. It is helpful to take an over-the-counter pain medication regularly for the first few weeks. Choose one of the following that works best for you:  °1. Naproxen (Aleve, etc) Two 220mg tabs twice a day °2. Ibuprofen (Advil, etc) Three 200mg tabs four times a day (every meal & bedtime) °3. Acetaminophen (Tylenol, etc) 500-650mg four times a day (every meal & bedtime) °4. A prescription for pain medication (such as oxycodone, hydrocodone, etc) should be given to you upon discharge. Take your pain medication as prescribed.  °1. If you are having problems/concerns with the prescription medicine (does not control pain, nausea, vomiting, rash, itching, etc), please call us [phone]  [phone] to see if we need to switch you to a different pain medicine that will work better for you and/or control your side effect better. °2. If you need a refill on your pain medication, please contact your pharmacy. They will contact our office to request authorization. Prescriptions will not be filled after 5 pm or on week-ends. °4. Avoid getting constipated. Between the surgery and the pain medications, it is common to experience some constipation. Increasing fluid intake and taking a fiber supplement (such as Metamucil, Citrucel, FiberCon, MiraLax, etc) 1-2 times a day regularly will usually help prevent this problem from occurring. A mild laxative (prune juice, Milk of Magnesia, MiraLax, etc) should be taken according to package directions if there are no bowel movements after 48 hours.  °5. Watch out for diarrhea. If you have many loose bowel movements, simplify your diet to bland foods & liquids for a few days. Stop any stool softeners and decrease your fiber supplement. Switching to mild anti-diarrheal medications (Kayopectate, Pepto Bismol) can help. If this worsens or does not improve, please call us. °6. Wash / shower every day. You may shower over the dressings as they are waterproof. Continue to shower over incision(s) after the dressing is off. °7. Remove your waterproof bandages 5 days after surgery. You may leave the incision open to air. You may replace a dressing/Band-Aid to cover the incision for comfort if you wish.  °8. ACTIVITIES as tolerated:  °1. You may resume regular (light) daily activities beginning the next day--such as daily self-care, walking, climbing stairs--gradually   increasing activities as tolerated. If you can walk 30 minutes without difficulty, it is safe to try more intense activity such as jogging, treadmill, bicycling, low-impact aerobics, swimming, etc. °2. Save the most intensive and strenuous activity for last such as sit-ups, heavy lifting, contact sports, etc Refrain  from any heavy lifting or straining until you are off narcotics for pain control.  °3. DO NOT PUSH THROUGH PAIN. Let pain be your guide: If it hurts to do something, don't do it. Pain is your body warning you to avoid that activity for another week until the pain goes down. °4. You may drive when you are no longer taking prescription pain medication, you can comfortably wear a seatbelt, and you can safely maneuver your car and apply brakes. °5. You may have sexual intercourse when it is comfortable.  °9. FOLLOW UP in our office  °1. Please call [facility] at [phone] to set up an appointment to see your surgeon in the office for a follow-up appointment approximately 2-3 weeks after your surgery. °2. Make sure that you call for this appointment the day you arrive home to insure a convenient appointment time. °     10. IF YOU HAVE DISABILITY OR FAMILY LEAVE FORMS, BRING THEM TO THE               OFFICE FOR PROCESSING.  ° °WHEN TO CALL US [phone]:  °1. Poor pain control °2. Reactions / problems with new medications (rash/itching, nausea, etc)  °3. Fever over 101.5 F (38.5 C) °4. Inability to urinate °5. Nausea and/or vomiting °6. Worsening swelling or bruising °7. Continued bleeding from incision. °8. Increased pain, redness, or drainage from the incision ° °The clinic staff is available to answer your questions during regular business hours (8:30am-5pm). Please don’t hesitate to call and ask to speak to one of our nurses for clinical concerns.  °If you have a medical emergency, go to the nearest emergency room or call 911.  °A surgeon from [facility] is always on call at the hospitals  ° °[facility]  °[address], [address], [place], [place] [address] ?  °MAIN: [phone] ? TOLL FREE: [phone] ?  °FAX [phone]  °[email or web address] ° °

## 2017-08-06 NOTE — Progress Notes (Signed)
[facility] Progress Note    Assessment/ Plan:   1.  AKI on CKD III: Likely hemodynamically mediated in setting of poor PO intake, Entresto, Lasix/ aldactone.  She is nonoliguric and she has no metabolic abnormalities.  Avoid NSAIDs, IV contrast, fleets enemas, mag citrate, PPIs.  Renal US showing medicorenal disease.  Her creatinine peaked at 3.7 and is now trending down to 2.4 today. Baseline creat appears to be about 1.6- 1.9.  Repeat creat in am.   2.  Diverticulitis: on Zosyn.  CT without abscess or perf.  CCS following.  Zofran changed to phenergan  3.  NICM: hold ACEi and diuretics.  Continuing carvedilol, hydral, and BiDil.  Pressures are a little low- have decreased carvedilol to 12.5 BID for now.  4.  Afib: s/p DCCV 3/19.  On Eliquis 2.5 mg BID as OP--> now Lovenox per pharm c/s and amiodarone  5.  Diarrhea: w/u per primary  Kelly Splinter MD [facility] pgr [phone]   08/06/2017, 3:59 PM      Subjective:    Still having diarrhea.  No new c/o.  Creat down 2.4.     Objective:   BP 128/67 (BP Location: Left Arm)   Pulse 67   Temp 98.3 F (36.8 C) (Oral)   Resp 16   Ht 4\' 11"  (1.499 m)   Wt 74.2 kg (163 lb 9.3 oz)   SpO2 100%   BMI 33.04 kg/m   Intake/Output Summary (Last 24 hours) at 08/06/2017 1552 Last data filed at 08/06/2017 0636 Gross per 24 hour  Intake 2040 ml  Output 102 ml  Net 1938 ml   Weight change: 0.853 kg (1 lb 14.1 oz)  Physical Exam: GEN NAD, lying in bed HEENT EOMI, PERRL NECK flat neck veins PULM clear bilaterally no c/w/r CV RRR soft systolic murmur ABD obese, slightly distended, hypoactive bowel sounds, no rebound/ guarding, tender diffusely EXT no LE edema NEURO nonfocal SKIN no rashes or lesions    Imaging: Dg Abd 2 Views  Result Date: 08/06/2017 CLINICAL DATA:  Small-bowel obstruction. History of an appendectomy. EXAM: ABDOMEN - 2 VIEW COMPARISON:  CT, 08/03/2017 FINDINGS: There are dilated  loops of small bowel with no colonic distention, similar to the prior CT. Air-fluid levels are noted on the decubitus view. No free air. Calcified fibroid is noted in the central pelvis. Surgical clips the right lower quadrant reflect prior appendectomy. IMPRESSION: 1. Persistent partial small bowel obstruction. No significant change from the prior CT scan. No free air. Electronically Signed   By: Lajean Manes M.D.   On: 08/06/2017 09:22    Labs: BMET Recent Labs  Lab 07/31/17 1229 07/31/17 2115 08/01/17 0237 08/02/17 0944 08/03/17 0323 08/04/17 0844 08/05/17 0339 08/06/17 0404  NA 136  --  137 139 135 140 135  --   K 4.4  --  4.4 4.2 4.2 4.6 4.0  --   CL 102  --  104 104 103 112* 107  --   CO2 22  --  21* 19* 22 17* 14*  --   GLUCOSE 140*  --  112* 120* 105* 90 81  --   BUN 54*  --  51* 68* 70* 73* 68*  --   CREATININE 2.19*  --  2.12* 2.51* 3.30* 3.77* 3.34* 2.42*  CALCIUM 9.6  --  9.0 9.3 8.4* 8.3* 8.4*  --   PHOS  --  3.3 3.4  --   --   --   --   --  CBC Recent Labs  Lab 07/31/17 1229 08/01/17 0237 08/03/17 0323 08/05/17 0339  WBC 11.9* 14.9* 9.8 5.9  HGB 14.8 13.3 11.2* 11.1*  HCT 43.9 40.0 34.4* 34.8*  MCV 88.5 89.3 89.1 89.5  PLT 188 174 179 184    Medications:    . amiodarone  200 mg Oral Daily  . carvedilol  12.5 mg Oral BID WC  . enoxaparin (LOVENOX) injection  70 mg Subcutaneous Q24H  . insulin aspart  0-5 Units Subcutaneous QHS  . insulin aspart  0-9 Units Subcutaneous TID WC  . isosorbide-hydrALAZINE  1 tablet Oral BID  . levothyroxine  125 mcg Oral QAC breakfast  . pantoprazole (PROTONIX) IV  40 mg Intravenous Q24H  . sertraline  50 mg Oral Daily

## 2017-08-06 NOTE — Progress Notes (Signed)
PROGRESS NOTE    Morgan Clay  PRF:[id number] DOB: Jan 20, 1946 DOA: 07/31/2017 PCP: [name], DO    Brief Narrative: Morgan Clay is a 72 y.o. female with medical history significant for NICM (last EF 45%) s/p CRT-D, A. fib on Eliquis with recent DCCV on 3/19, OSA on CPAP, HCV status post Harvoni (viremic response not documented), hypothyroidism, depression and diet managed type 2 diabetes mellitus who presents to the ED with 5 days of diffuse, generalized abdominal pain, with associated anorexia, nausea and one episode of profuse nonbloody, nonbilious emesis on the day of presentation.  CT abdomen and pelvis without contrast showed Suspected mild acute diverticulitis involving the proximal sigmoid colon. Scattered small bowel air-fluid levels and with questionable wall thickening of a few left-sided small bowel loops, query enteritis.  Bilateral lower lobe lung opacities compatible with pneumonia.    Assessment & Plan:   Active Problems:   Hypothyroidism   Obesity   OBSTRUCTIVE SLEEP APNEA   Type 2 diabetes, controlled, with renal manifestation (HCC)   Chronic kidney disease - followed by [facility]   Chronic obstructive pulmonary disease ([place])   Hypertension associated with diabetes ([place])   Persistent atrial fibrillation (HCC)   Diverticulitis  Acute sigmoid diverticulitis and enteritis Was initially on unasyn, changed to zosyn on 3/28.  Patient continues to have persistent lower abdominal pain so we will not de-escalate the antibiotics or  advance her diet.  Continue with liquid diet, pain control,  antiemetics as needed and IV fluids. Surgery consulted for persistent abd pain, nausea, and vomiting. Repeat CT abdomen and pelvis ordered by surgery for persistent symptoms. Showed small bowel loops dilatation consistent with SBO. Repeat films this am shows persistent dilated small bowel loops. She will probably need NG tube decompression.   Repeat lactate is normal.    Wbc count continues to be wnl. Recommended oob. No change in meds or diet.  Checking C diff for loose bowel movements.       Acute renal failure patient's baseline creatinine appears to be around 1.5,  Admitted with a creatinine around 2, slowly worsened to 3.7 , now it has started to improve with fluids to 2.4 today. Holding Entresto, Aldactone and Lasix since admission ,  avoid nephrotoxins.  Strict in and out and daily weights.  Continue to monitor creatinine. Started her on IV fluids NS 139ml/ hr and recheck creatinine in am. UA is negative for infection.  Suspect prerenal as patient has been hypotensive, dehydrated with decreased urine output.  Nephrology consulted for recommendations. Appreciate their assistance.    Chronic systolic heart failure patient is slightly dehydrated we will hold the Lasix Aldactone and Entresto.  Resume the rest of the home medications as ordered, Coreg.  Elevated troponin's  Sec to demand ischemia. She denies any chest pain.   Chronic atrial fibrillation rate controlled , eliquis discontinued and started her lovenox inj because of her renal parameters. Once her creatinine is back to baseline , will restart her on eliquis.    Hypothyroidism resume Synthroid.   Obstructive sleep apnea on CPAP at night continue the same.   History of hepatitis C status post Harwani.  Outpatient follow-up with PCP..   Diabetes mellitus  CBG (last 3)  Recent Labs    08/05/17 1718 08/05/17 2204 08/06/17 0811  GLUCAP 72 74 94   Well-controlled. Diet controlled. Resume SSI.  No changes to medications.  Mild anemia normocytic.  Her baseline hemoglobin around 12. Currently its 11. No signs of bleeding.  DVT prophylaxis: lovenox.  Code Status: Full code Family Communication: daughter at bedside Disposition Plan: Possibly home once sbo and diverticulitis resolved.   Consultants:   Surgery.  Nephrology  Procedures: None  Antimicrobials:  zosyn.   Subjective:   Objective: Vitals:   08/05/17 1355 08/05/17 2037 08/06/17 0405 08/06/17 0553  BP: (!) 184/70 (!) 168/77 128/67   Pulse: 65 64 67   Resp: 16 18 16    Temp: 97.6 F (36.4 C) 98.2 F (36.8 C) 98.3 F (36.8 C)   TempSrc: Oral Oral Oral   SpO2: 97% 97% 100%   Weight:    74.2 kg (163 lb 9.3 oz)  Height:        Intake/Output Summary (Last 24 hours) at 08/06/2017 1050 Last data filed at 08/06/2017 0636 Gross per 24 hour  Intake 2090 ml  Output 103 ml  Net 1987 ml   Filed Weights   08/04/17 0726 08/05/17 0737 08/06/17 0553  Weight: 73.3 kg (161 lb 11.2 oz) 74.2 kg (163 lb 9.3 oz) 74.2 kg (163 lb 9.3 oz)    Examination:  General exam: in mild distress today from nausea.  Respiratory system: clear to auscultation, no wheezing or rhonchi.  Cardiovascular system: S1 & S2 heard, RRR. No JVD, murmurs . No pedal edema. Gastrointestinal system: Abdomen continues to be distended, bowel sounds diminished, gen tenderness.  Central nervous system: Alert and oriented.  Nonfocal Extremities: no edema, cyanosis or clubbing.  Skin: No rashes, lesions or ulcers Psychiatry:  Anxious.    Data Reviewed: I have personally reviewed following labs and imaging studies  CBC: Recent Labs  Lab 07/31/17 1229 08/01/17 0237 08/03/17 0323 08/05/17 0339  WBC 11.9* 14.9* 9.8 5.9  HGB 14.8 13.3 11.2* 11.1*  HCT 43.9 40.0 34.4* 34.8*  MCV 88.5 89.3 89.1 89.5  PLT 188 174 179 979   Basic Metabolic Panel: Recent Labs  Lab 07/31/17 2115 08/01/17 0237 08/02/17 0944 08/03/17 0323 08/04/17 0844 08/05/17 0339 08/06/17 0404  NA  --  137 139 135 140 135  --   K  --  4.4 4.2 4.2 4.6 4.0  --   CL  --  104 104 103 112* 107  --   CO2  --  21* 19* 22 17* 14*  --   GLUCOSE  --  112* 120* 105* 90 81  --   BUN  --  51* 68* 70* 73* 68*  --   CREATININE  --  2.12* 2.51* 3.30* 3.77* 3.34* 2.42*  CALCIUM  --  9.0 9.3 8.4* 8.3* 8.4*  --   MG 2.0 2.1  --   --   --   --   --   PHOS 3.3  3.4  --   --   --   --   --    GFR: Estimated Creatinine Clearance: 19.3 mL/min (A) (by C-G formula based on SCr of 2.42 mg/dL (H)). Liver Function Tests: Recent Labs  Lab 07/31/17 1537  AST 16  ALT 13*  ALKPHOS 69  BILITOT 1.0  PROT 9.3*  ALBUMIN 4.6   Recent Labs  Lab 07/31/17 1537  LIPASE 25   No results for input(s): AMMONIA in the last 168 hours. Coagulation Profile: Recent Labs  Lab 07/31/17 1537  INR 1.37   Cardiac Enzymes: Recent Labs  Lab 07/31/17 1537 07/31/17 2115 08/01/17 0237 08/01/17 0715  TROPONINI 0.04* 0.05* 0.05* 0.05*   BNP (last 3 results) No results for input(s): PROBNP in the last 8760 hours. HbA1C: No  results for input(s): HGBA1C in the last 72 hours. CBG: Recent Labs  Lab 08/05/17 0750 08/05/17 1154 08/05/17 1718 08/05/17 2204 08/06/17 0811  GLUCAP 77 89 72 74 94   Lipid Profile: No results for input(s): CHOL, HDL, LDLCALC, TRIG, CHOLHDL, LDLDIRECT in the last 72 hours. Thyroid Function Tests: No results for input(s): TSH, T4TOTAL, FREET4, T3FREE, THYROIDAB in the last 72 hours. Anemia Panel: No results for input(s): VITAMINB12, FOLATE, FERRITIN, TIBC, IRON, RETICCTPCT in the last 72 hours. Sepsis Labs: Recent Labs  Lab 07/31/17 1546 08/03/17 1116  LATICACIDVEN 0.90 0.7    No results found for this or any previous visit (from the past 240 hour(s)).       Radiology Studies: Dg Abd 2 Views  Result Date: 08/06/2017 CLINICAL DATA:  Small-bowel obstruction. History of an appendectomy. EXAM: ABDOMEN - 2 VIEW COMPARISON:  CT, 08/03/2017 FINDINGS: There are dilated loops of small bowel with no colonic distention, similar to the prior CT. Air-fluid levels are noted on the decubitus view. No free air. Calcified fibroid is noted in the central pelvis. Surgical clips the right lower quadrant reflect prior appendectomy. IMPRESSION: 1. Persistent partial small bowel obstruction. No significant change from the prior CT scan. No free  air. Electronically Signed   By: Lajean Manes M.D.   On: 08/06/2017 09:22        Scheduled Meds: . amiodarone  200 mg Oral Daily  . carvedilol  12.5 mg Oral BID WC  . enoxaparin (LOVENOX) injection  70 mg Subcutaneous Q24H  . insulin aspart  0-5 Units Subcutaneous QHS  . insulin aspart  0-9 Units Subcutaneous TID WC  . isosorbide-hydrALAZINE  1 tablet Oral BID  . levothyroxine  125 mcg Oral QAC breakfast  . pantoprazole (PROTONIX) IV  40 mg Intravenous Q24H  . sertraline  50 mg Oral Daily   Continuous Infusions: . sodium chloride 125 mL/hr at 08/06/17 0233  . piperacillin-tazobactam (ZOSYN)  IV Stopped (08/06/17 0606)     LOS: 6 days    Time spent: 35 minutes.     Hosie Poisson, MD [facility] Pager [phone]  If 7PM-7AM, please contact night-coverage [email or web address] Password [email or web address] 08/06/2017, 10:50 AM

## 2017-08-06 NOTE — Progress Notes (Signed)
Physical Therapy Treatment Patient Details Name: Morgan Clay MRN: [id number] DOB: Mar 01, 1946 Today's Date: 08/06/2017    History of Present Illness 72 yo female admitted to [facility] on 07/31/2017 with abdominal pain.  She is foind to have Acute sigmoid diverticulitis and enteritis.  She has been having pain and limited mobilty with frequent diarrhea     PT Comments    Patient's progress is slow, related to frequent BM's. Patient may benefit from post acute rehab. Patient will consider, depending on progress. Patient lives alone.  Continue PT.   Follow Up Recommendations  Home health PT;SNF     Equipment Recommendations  Rolling walker with 5" wheels    Recommendations for Other Services       Precautions / Restrictions Precautions Precautions: Fall Precaution Comments: fatigues quickly    Mobility  Bed Mobility Overal bed mobility: Modified Independent             General bed mobility comments: needs extra time, uses bedrails and pushes body with arms   Transfers Overall transfer level: Needs assistance Equipment used: Rolling walker (2 wheeled) Transfers: Sit to/from [facility] Sit to Stand: Min assist Stand pivot transfers: Min guard       General transfer comment: needs assist as pt appears very uncomfortable   Ambulation/Gait Ambulation/Gait assistance: Min assist Ambulation Distance (Feet): 100 Feet(then 50) Assistive device: Rolling walker (2 wheeled) Gait Pattern/deviations: Step-to pattern;Step-through pattern Gait velocity: slow   General Gait Details: pt moving slowly, and has to take a seated  breaks but is able to walk increased distance   Stairs            Wheelchair Mobility    Modified Rankin (Stroke Patients Only)       Balance                                            Cognition Arousal/Alertness: Awake/alert                                            Exercises       General Comments        Pertinent Vitals/Pain Pain Location: abdomen    Home Living                      Prior Function            PT Goals (current goals can now be found in the care plan section) Progress towards PT goals: Progressing toward goals    Frequency    Min 2X/week      PT Plan Current plan remains appropriate    Co-evaluation              AM-PAC PT "6 Clicks" Daily Activity  Outcome Measure  Difficulty turning over in bed (including adjusting bedclothes, sheets and blankets)?: A Little Difficulty moving from lying on back to sitting on the side of the bed? : A Little Difficulty sitting down on and standing up from a chair with arms (e.g., wheelchair, bedside commode, etc,.)?: A Little Help needed moving to and from a bed to chair (including a wheelchair)?: A Little Help needed walking in hospital room?: A Lot Help needed climbing 3-5 steps with a railing? : A Lot  6 Click Score: 16    End of Session   Activity Tolerance: Patient limited by fatigue Patient left: in bed;with bed alarm set;with call bell/phone within reach Nurse Communication: Mobility status       Time: 8867-7373 PT Time Calculation (min) (ACUTE ONLY): 20 min  Charges:  $Gait Training: 8-22 mins                    G CodesTresa Endo PT [phone]   Claretha Cooper 08/06/2017, 1:32 PM

## 2017-08-07 DIAGNOSIS — K56600 Partial intestinal obstruction, unspecified as to cause: Secondary | ICD-10-CM

## 2017-08-07 DIAGNOSIS — N179 Acute kidney failure, unspecified: Secondary | ICD-10-CM

## 2017-08-07 DIAGNOSIS — K529 Noninfective gastroenteritis and colitis, unspecified: Secondary | ICD-10-CM

## 2017-08-07 LAB — BASIC METABOLIC PANEL
Anion gap: 12 (ref 5–15)
BUN: 43 mg/dL — ABNORMAL HIGH (ref 6–20)
CHLORIDE: 116 mmol/L — AB (ref 101–111)
CO2: 14 mmol/L — AB (ref 22–32)
CREATININE: 2.15 mg/dL — AB (ref 0.44–1.00)
Calcium: 8.6 mg/dL — ABNORMAL LOW (ref 8.9–10.3)
GFR calc Af Amer: 26 mL/min — ABNORMAL LOW (ref >60)
GFR calc non Af Amer: 22 mL/min — ABNORMAL LOW (ref >60)
Glucose, Bld: 92 mg/dL (ref 65–99)
Potassium: 4 mmol/L (ref 3.5–5.1)
SODIUM: 142 mmol/L (ref 135–145)

## 2017-08-07 LAB — CBC
HCT: 32.9 % — ABNORMAL LOW (ref 36.0–46.0)
HEMOGLOBIN: 10.6 g/dL — AB (ref 12.0–15.0)
MCH: 28.6 pg (ref 26.0–34.0)
MCHC: 32.2 g/dL (ref 30.0–36.0)
MCV: 88.9 fL (ref 78.0–100.0)
Platelets: 184 10*3/uL (ref 150–400)
RBC: 3.7 MIL/uL — ABNORMAL LOW (ref 3.87–5.11)
RDW: 15.8 % — ABNORMAL HIGH (ref 11.5–15.5)
WBC: 8.2 10*3/uL (ref 4.0–10.5)

## 2017-08-07 LAB — GLUCOSE, CAPILLARY
GLUCOSE-CAPILLARY: 93 mg/dL (ref 65–99)
Glucose-Capillary: 91 mg/dL (ref 65–99)
Glucose-Capillary: 99 mg/dL (ref 65–99)
Glucose-Capillary: 98 mg/dL (ref 65–99)

## 2017-08-07 MED ORDER — PIPERACILLIN-TAZOBACTAM 3.375 G IVPB
3.3750 g | Freq: Three times a day (TID) | INTRAVENOUS | Status: AC
Start: 2017-08-07 — End: 2017-08-10
  Administered 2017-08-07 – 2017-08-09 (×7): 3.375 g via INTRAVENOUS
  Filled 2017-08-07 (×8): qty 50

## 2017-08-07 MED ORDER — SODIUM BICARBONATE 8.4 % IV SOLN
INTRAVENOUS | Status: DC
  Administered 2017-08-07 – 2017-08-09 (×5): via INTRAVENOUS
  Filled 2017-08-07 (×5): qty 900

## 2017-08-07 MED ORDER — BOOST / RESOURCE BREEZE PO LIQD CUSTOM
1.0000 | Freq: Three times a day (TID) | ORAL | Status: DC
  Administered 2017-08-07 – 2017-08-08 (×4): 1 via ORAL
  Administered 2017-08-09: 11:00:00 via ORAL
  Administered 2017-08-10 (×3): 1 via ORAL
  Administered 2017-08-11: 237 mL via ORAL
  Administered 2017-08-11 – 2017-08-16 (×8): 1 via ORAL

## 2017-08-07 MED ORDER — METOCLOPRAMIDE HCL 5 MG/ML IJ SOLN
5.0000 mg | Freq: Three times a day (TID) | INTRAMUSCULAR | Status: DC
  Administered 2017-08-07 – 2017-08-17 (×30): 5 mg via INTRAVENOUS
  Filled 2017-08-07 (×30): qty 2

## 2017-08-07 NOTE — [place] FL2 (Signed)
[place] LEVEL OF CARE SCREENING TOOL     IDENTIFICATION  Patient Name: Morgan Clay Birthdate: [birth year] Sex: female Admission Date (Current Location): 07/31/2017  [facility] and [place] Number:  Herbalist and Address:  [facility],  [place] [address], [place]      Provider Number: [id number]  Attending Physician Name and Address:  Hosie Poisson, MD  Relative Name and Phone Number:       Current Level of Care: Hospital Recommended Level of Care: [place] Prior Approval Number:    Date Approved/Denied:   PASRR Number:    Discharge Plan: SNF    Current Diagnoses: Patient Active Problem List   Diagnosis Date Noted  . Diverticulitis 07/31/2017  . Persistent atrial fibrillation ([place])   . Hypertension associated with diabetes ([place]) 06/07/2017  . MDD (major depressive disorder), recurrent, in partial remission ([place]) 06/07/2017  . Asthma 05/22/2017  . Chronic obstructive pulmonary disease ([place])   . Wheezes   . CHF (congestive heart failure) ([place]) 02/23/2017  . Atypical atrial flutter ([place]) 09/28/2015  . Type 2 diabetes, controlled, with renal manifestation ([place]) 11/24/2013  . Chronic kidney disease - followed by [facility] 11/24/2013  . Nonischemic cardiomyopathy ([place]) 11/22/2010  . Obesity 05/27/2009  . OSTEOPENIA 09/30/2008  . OBSTRUCTIVE SLEEP APNEA 11/14/2007  . Chronic systolic heart failure ([place]) 04/24/2007  . Hypothyroidism 01/28/2007  . Biventricular ICD (implantable cardioverter-defibrillator) in place 01/08/2007    Orientation RESPIRATION BLADDER Height & Weight     Self, Time, Situation, Place  Normal Continent Weight: 163 lb 9.3 oz (74.2 kg) Height:  4\' 11"  (149.9 cm)  BEHAVIORAL SYMPTOMS/MOOD NEUROLOGICAL BOWEL NUTRITION STATUS      Incontinent Diet(clear liquid diet- see DC summary for updated diet order)  AMBULATORY STATUS COMMUNICATION OF NEEDS Skin   Limited Assist Verbally  Normal                       Personal Care Assistance Level of Assistance  Bathing, Feeding, Dressing Bathing Assistance: Limited assistance Feeding assistance: Independent Dressing Assistance: Limited assistance     Functional Limitations Info  Sight, Hearing, Speech Sight Info: Adequate Hearing Info: Adequate Speech Info: Adequate    SPECIAL CARE FACTORS FREQUENCY  PT (By licensed PT), OT (By licensed OT)     PT Frequency: 5x OT Frequency: 5x            Contractures Contractures Info: Not present    Additional Factors Info  Code Status, Allergies Code Status Info: full code Allergies Info: nka           Current Medications (08/07/2017):  This is the current hospital active medication list Current Facility-Administered Medications  Medication Dose Route Frequency Provider Last Rate Last Dose  . acetaminophen (TYLENOL) tablet 650 mg  650 mg Oral Q6H PRN Bennie Pierini, MD       Or  . acetaminophen (TYLENOL) suppository 650 mg  650 mg Rectal Q6H PRN Bennie Pierini, MD      . albuterol (PROVENTIL) (2.5 MG/3ML) 0.083% nebulizer solution 2.5 mg  2.5 mg Nebulization Q6H PRN Bennie Pierini, MD      . amiodarone (PACERONE) tablet 200 mg  200 mg Oral Daily Bennie Pierini, MD   200 mg at 08/07/17 1014  . carvedilol (COREG) tablet 12.5 mg  12.5 mg Oral BID WC [name], MD   12.5 mg at 08/07/17 0837  . enoxaparin (LOVENOX) injection 70  mg  70 mg Subcutaneous Q24H Minda Ditto, RPH   70 mg at 08/06/17 2232  . feeding supplement (BOOST / RESOURCE BREEZE) liquid 1 Container  1 Container Oral TID BM Jill Alexanders, PA-C   1 Container at 08/07/17 1015  . hydrALAZINE (APRESOLINE) injection 10 mg  10 mg Intravenous Q6H PRN Hosie Poisson, MD      . insulin aspart (novoLOG) injection 0-5 Units  0-5 Units Subcutaneous QHS Bennie Pierini, MD   0 Units at 08/02/17 2157  . insulin aspart (novoLOG) injection 0-9 Units  0-9 Units Subcutaneous TID WC  Bennie Pierini, MD   1 Units at 08/02/17 1239  . iopamidol (ISOVUE-300) 61 % injection 15 mL  15 mL Oral BID PRN Hosie Poisson, MD      . isosorbide-hydrALAZINE (BIDIL) 20-37.5 MG per tablet 1 tablet  1 tablet Oral BID Bennie Pierini, MD   1 tablet at 08/07/17 1014  . levothyroxine (SYNTHROID, LEVOTHROID) tablet 125 mcg  125 mcg Oral QAC breakfast Bennie Pierini, MD   125 mcg at 08/07/17 [phone]  . morphine 2 MG/ML injection 2-3 mg  2-3 mg Intravenous Q4H PRN Hosie Poisson, MD   2 mg at 08/07/17 0047  . ondansetron (ZOFRAN) injection 4 mg  4 mg Intravenous Q4H PRN Hosie Poisson, MD   4 mg at 08/02/17 0816  . oxyCODONE (Oxy IR/ROXICODONE) immediate release tablet 5 mg  5 mg Oral Q4H PRN Bennie Pierini, MD   5 mg at 08/02/17 0955  . pantoprazole (PROTONIX) injection 40 mg  40 mg Intravenous Q24H Hosie Poisson, MD   40 mg at 08/07/17 1014  . piperacillin-tazobactam (ZOSYN) IVPB 2.25 g  2.25 g Intravenous Q6H Royetta Asal, RPH   Stopped at 08/07/17 0545  . promethazine (PHENERGAN) injection 12.5 mg  12.5 mg Intravenous Q6H PRN Hosie Poisson, MD      . sertraline (ZOLOFT) tablet 50 mg  50 mg Oral Daily Bennie Pierini, MD   50 mg at 08/07/17 1014  . sodium bicarbonate 100 mEq in sterile water 1,000 mL infusion   Intravenous Continuous Roney Jaffe, MD 100 mL/hr at 08/07/17 1018    . traZODone (DESYREL) tablet 50 mg  50 mg Oral QHS PRN Bennie Pierini, MD   50 mg at 08/02/17 2135     Discharge Medications: Please see discharge summary for a list of discharge medications.  Relevant Imaging Results:  Relevant Lab Results:   Additional Information SS# [id number]  Nila Nephew, LCSW

## 2017-08-07 NOTE — Progress Notes (Signed)
PROGRESS NOTE    Delila Kuklinski  XLK:[id number] DOB: 1946-03-26 DOA: 07/31/2017 PCP: [name], DO    Brief Narrative: Denisia Harpole is a 72 y.o. female with medical history significant for NICM (last EF 45%) s/p CRT-D, A. fib on Eliquis with recent DCCV on 3/19, OSA on CPAP, HCV status post Harvoni (viremic response not documented), hypothyroidism, depression and diet managed type 2 diabetes mellitus who presents to the ED with 5 days of diffuse, generalized abdominal pain, with associated anorexia, nausea and one episode of profuse nonbloody, nonbilious emesis on the day of presentation.  CT abdomen and pelvis without contrast showed Suspected mild acute diverticulitis involving the proximal sigmoid colon. Scattered small bowel air-fluid levels and with questionable wall thickening of a few left-sided small bowel loops, query enteritis.  Bilateral lower lobe lung opacities compatible with pneumonia.    Assessment & Plan:   Active Problems:   Hypothyroidism   Obesity   OBSTRUCTIVE SLEEP APNEA   Type 2 diabetes, controlled, with renal manifestation (HCC)   Chronic kidney disease - followed by [facility]   Chronic obstructive pulmonary disease ([place])   Hypertension associated with diabetes ([place])   Persistent atrial fibrillation (HCC)   Diverticulitis  Acute sigmoid diverticulitis and enteritis Was initially on unasyn, changed to zosyn on 3/28.  Patient continues to have persistent lower abdominal pain so we will not de-escalate the antibiotics or  advance her diet.  Continue with liquid diet, pain control,  antiemetics as needed and IV fluids. Surgery consulted for persistent abd pain, nausea, and vomiting. Repeat CT abdomen and pelvis ordered by surgery for persistent symptoms. Showed small bowel loops dilatation consistent with SBO. Repeat films this am shows persistent dilated small bowel loops.  Repeat lactate is normal.  Wbc count continues to be wnl. Recommended oob. No  change in meds or diet.  c diff is negative . She had 2 bowel movements overnight, and nothing so far.  Her nausea, and vomiting have improved but she still reports abdominal discomfort, though improved from yesterday.  Surgery on board and assisting.       Acute renal failure patient's baseline creatinine appears to be around 1.5,  Admitted with a creatinine around 2, slowly worsened to 3.7 , now it has started to improve with fluids to 2.1 today Holding Entresto, Aldactone and Lasix since admission ,  avoid nephrotoxins.  Strict in and out and daily weights.  Continue to monitor creatinine. Started her on IV fluids NS 114ml/ hr, continue with the fluids and recheck creatinine in am.  UA is negative for infection.  Suspect prerenal as patient has been hypotensive, dehydrated with decreased urine output.  Nephrology consulted for recommendations. Appreciate their assistance.    Chronic systolic heart failure patient is slightly dehydrated we will hold the Lasix Aldactone and Entresto.  Resume the rest of the home medications as ordered, Coreg.  Elevated troponin's  Sec to demand ischemia. She denies any chest pain.   Chronic atrial fibrillation rate controlled , eliquis discontinued and started her lovenox inj because of her renal parameters. Once her creatinine is back to baseline , will restart her on eliquis.    Hypothyroidism resume Synthroid.   Obstructive sleep apnea on CPAP at night continue the same.   History of hepatitis C status post Harwani.  Outpatient follow-up with PCP..   Diabetes mellitus  CBG (last 3)  Recent Labs    08/06/17 1801 08/06/17 2223 08/07/17 0732  GLUCAP 87 79 91  Well-controlled. Diet controlled. Resume SSI.  No changes to medications.  Mild anemia normocytic.  Her baseline hemoglobin around 12. Currently its 10.6 No signs of bleeding.   DVT prophylaxis: lovenox.  Code Status: Full code Family Communication: daughter at  bedside Disposition Plan: Possibly home once sbo and diverticulitis resolved.   Consultants:   Surgery.  Nephrology  Procedures: None  Antimicrobials: zosyn.   Subjective: Reports she is sore from loose bowel movements.  As per RN only 2 BM overnight.   Objective: Vitals:   08/06/17 0553 08/06/17 1616 08/06/17 2228 08/07/17 0541  BP:  (!) 167/61 (!) 139/51 (!) 144/59  Pulse:  69 68 67  Resp:  18 18 18   Temp:  97.6 F (36.4 C) 98.4 F (36.9 C) 97.6 F (36.4 C)  TempSrc:  Oral Oral Oral  SpO2:  100% 97% 100%  Weight: 74.2 kg (163 lb 9.3 oz)     Height:        Intake/Output Summary (Last 24 hours) at 08/07/2017 1232 Last data filed at 08/07/2017 0441 Gross per 24 hour  Intake 2910.42 ml  Output 300 ml  Net 2610.42 ml   Filed Weights   08/04/17 0726 08/05/17 0737 08/06/17 0553  Weight: 73.3 kg (161 lb 11.2 oz) 74.2 kg (163 lb 9.3 oz) 74.2 kg (163 lb 9.3 oz)    Examination:  General exam: uncomfortable, but otherwise not in distress.  Respiratory system: clear to auscultation, no wheezing or rhonchi.  Cardiovascular system: S1 & S2 heard, RRR. No JVD, murmurs . No pedal edema. Gastrointestinal system: Abdomen continues to be distended, bowel sounds good, soft abdomen and mild gen tenderness.  Central nervous system: Alert and oriented.  Nonfocal Extremities: no edema, cyanosis or clubbing.  Skin: No rashes, lesions or ulcers Psychiatry:  Anxious.    Data Reviewed: I have personally reviewed following labs and imaging studies  CBC: Recent Labs  Lab 08/01/17 0237 08/03/17 0323 08/05/17 0339 08/07/17 0411  WBC 14.9* 9.8 5.9 8.2  HGB 13.3 11.2* 11.1* 10.6*  HCT 40.0 34.4* 34.8* 32.9*  MCV 89.3 89.1 89.5 88.9  PLT 174 179 184 329   Basic Metabolic Panel: Recent Labs  Lab 07/31/17 2115  08/01/17 0237 08/02/17 0944 08/03/17 0323 08/04/17 0844 08/05/17 0339 08/06/17 0404 08/07/17 0411  NA  --    < > 137 139 135 140 135  --  142  K  --    < > 4.4 4.2  4.2 4.6 4.0  --  4.0  CL  --    < > 104 104 103 112* 107  --  116*  CO2  --    < > 21* 19* 22 17* 14*  --  14*  GLUCOSE  --    < > 112* 120* 105* 90 81  --  92  BUN  --    < > 51* 68* 70* 73* 68*  --  43*  CREATININE  --    < > 2.12* 2.51* 3.30* 3.77* 3.34* 2.42* 2.15*  CALCIUM  --    < > 9.0 9.3 8.4* 8.3* 8.4*  --  8.6*  MG 2.0  --  2.1  --   --   --   --   --   --   PHOS 3.3  --  3.4  --   --   --   --   --   --    < > = values in this interval not displayed.   GFR: Estimated  Creatinine Clearance: 21.7 mL/min (A) (by C-G formula based on SCr of 2.15 mg/dL (H)). Liver Function Tests: Recent Labs  Lab 07/31/17 1537  AST 16  ALT 13*  ALKPHOS 69  BILITOT 1.0  PROT 9.3*  ALBUMIN 4.6   Recent Labs  Lab 07/31/17 1537  LIPASE 25   No results for input(s): AMMONIA in the last 168 hours. Coagulation Profile: Recent Labs  Lab 07/31/17 1537  INR 1.37   Cardiac Enzymes: Recent Labs  Lab 07/31/17 1537 07/31/17 2115 08/01/17 0237 08/01/17 0715  TROPONINI 0.04* 0.05* 0.05* 0.05*   BNP (last 3 results) No results for input(s): PROBNP in the last 8760 hours. HbA1C: No results for input(s): HGBA1C in the last 72 hours. CBG: Recent Labs  Lab 08/06/17 0811 08/06/17 1220 08/06/17 1801 08/06/17 2223 08/07/17 0732  GLUCAP 94 101* 87 79 91   Lipid Profile: No results for input(s): CHOL, HDL, LDLCALC, TRIG, CHOLHDL, LDLDIRECT in the last 72 hours. Thyroid Function Tests: No results for input(s): TSH, T4TOTAL, FREET4, T3FREE, THYROIDAB in the last 72 hours. Anemia Panel: No results for input(s): VITAMINB12, FOLATE, FERRITIN, TIBC, IRON, RETICCTPCT in the last 72 hours. Sepsis Labs: Recent Labs  Lab 07/31/17 1546 08/03/17 1116  LATICACIDVEN 0.90 0.7    Recent Results (from the past 240 hour(s))  C difficile quick scan w PCR reflex     Status: None   Collection Time: 08/06/17 10:16 AM  Result Value Ref Range Status   C Diff antigen NEGATIVE NEGATIVE Final   C Diff  toxin NEGATIVE NEGATIVE Final   C Diff interpretation No C. difficile detected.  Final    Comment: Performed at [facility], [place] [address] [place], [place] [address]         Radiology Studies: Dg Abd 2 Views  Result Date: 08/06/2017 CLINICAL DATA:  Small-bowel obstruction. History of an appendectomy. EXAM: ABDOMEN - 2 VIEW COMPARISON:  CT, 08/03/2017 FINDINGS: There are dilated loops of small bowel with no colonic distention, similar to the prior CT. Air-fluid levels are noted on the decubitus view. No free air. Calcified fibroid is noted in the central pelvis. Surgical clips the right lower quadrant reflect prior appendectomy. IMPRESSION: 1. Persistent partial small bowel obstruction. No significant change from the prior CT scan. No free air. Electronically Signed   By: Lajean Manes M.D.   On: 08/06/2017 09:22        Scheduled Meds: . amiodarone  200 mg Oral Daily  . carvedilol  12.5 mg Oral BID WC  . enoxaparin (LOVENOX) injection  70 mg Subcutaneous Q24H  . feeding supplement  1 Container Oral TID BM  . insulin aspart  0-5 Units Subcutaneous QHS  . insulin aspart  0-9 Units Subcutaneous TID WC  . isosorbide-hydrALAZINE  1 tablet Oral BID  . levothyroxine  125 mcg Oral QAC breakfast  . pantoprazole (PROTONIX) IV  40 mg Intravenous Q24H  . sertraline  50 mg Oral Daily   Continuous Infusions: . piperacillin-tazobactam (ZOSYN)  IV    .  sodium bicarbonate (isotonic) infusion in sterile water 100 mL/hr at 08/07/17 1018     LOS: 7 days    Time spent: 35 minutes.     Hosie Poisson, MD [facility] Pager [phone]  If 7PM-7AM, please contact night-coverage [email or web address] Password [facility] 08/07/2017, 12:32 PM

## 2017-08-07 NOTE — Progress Notes (Signed)
ANTICOAGULATION CONSULT NOTE - Follow-Up Consult  Pharmacy Consult for Lovenox Indication: atrial fibrillation  No Known Allergies  Patient Measurements: Height: 4\' 11"  (149.9 cm) Weight: 163 lb 9.3 oz (74.2 kg) IBW/kg (Calculated) : 43.2  Vital Signs: Temp: 97.6 F (36.4 C) (04/02 0541) Temp Source: Oral (04/02 0541) BP: 144/59 (04/02 0541) Pulse Rate: 67 (04/02 0541)  Labs: Recent Labs    08/05/17 0339 08/06/17 0404 08/07/17 0411  HGB 11.1*  --  10.6*  HCT 34.8*  --  32.9*  PLT 184  --  184  CREATININE 3.34* 2.42* 2.15*   Estimated Creatinine Clearance: 21.7 mL/min (A) (by C-G formula based on SCr of 2.15 mg/dL (H)).  Medical History: Past Medical History:  Diagnosis Date  . Arthritis   . Asthma    reports mild asthma since childhood - had COPD on dx list from prior PCP  . Atypical atrial flutter ([place]) 09/28/2015  . Chronic systolic congestive heart failure ([place])   . DEPRESSION 12/17/2009   Annotation: PHQ-9 score = 14 done on 12/17/2009 Qualifier: Diagnosis of  By: Hassell Done FNP, Tori Milks    . FIBROIDS, UTERUS 03/05/2008  . Glaucoma   . HEPATITIS C - s/p treatment with Harvoni, seeing hepatology, Dawn Drazek 01/04/2007  . Hyperlipemia 12/06/2012  . HYPERTENSION, BENIGN 04/24/2007  . Hypothyroidism   . LBBB (left bundle branch block)   . Nonischemic cardiomyopathy ([place])   . OBESITY 05/27/2009  . OBSTRUCTIVE SLEEP APNEA 11/14/2007   npsg 2009:  Mild osa with AHI 11/hr. Started cpap 2009, quit using due to lack of response Home sleep testing 09/2010 with AHI only 7/hr and weight loss advised by pulmonology.  . OSTEOPENIA 09/30/2008  . Personal history of other infectious and parasitic disease    Hepatitis B   Medications:  Scheduled:  . amiodarone  200 mg Oral Daily  . carvedilol  12.5 mg Oral BID WC  . enoxaparin (LOVENOX) injection  70 mg Subcutaneous Q24H  . feeding supplement  1 Container Oral TID BM  . insulin aspart  0-5 Units Subcutaneous QHS  . insulin  aspart  0-9 Units Subcutaneous TID WC  . isosorbide-hydrALAZINE  1 tablet Oral BID  . levothyroxine  125 mcg Oral QAC breakfast  . pantoprazole (PROTONIX) IV  40 mg Intravenous Q24H  . sertraline  50 mg Oral Daily   Infusions:  . piperacillin-tazobactam (ZOSYN)  IV 2.25 g (08/07/17 1159)  .  sodium bicarbonate (isotonic) infusion in sterile water 100 mL/hr at 08/07/17 1018   Assessment:71 yoF to ED 3/26 with abd pain. Medical history significantfor NICM (last EF 45%) s/pCRT-D, A. fib on Eliquis with recent DCCV on 3/19, OSA on CPAP, HCV status post Harvoni (viremic response not documented), hypothyroidism, depression and diet managed type 2 diabetes mellitus.  AKI on admission, suspected diverticulitis vs enteritis, CXray: prob PNA  PTA Apixaban 5mg  bid resumed  SCr continues to increase, change to Lovenox  Goal of Therapy:  Anti-Xa level 0.6-1 units/ml 4hrs after LMWH dose given, if level deemed necessary Monitor platelets by anticoagulation protocol: Yes   Plan:   Apixaban changed to Lovenox 70mg  SQ q24, last Apixaban 3/30 am  Continue Lovenox 70 mg SQ daily  Monitor renal function, ability to return to apixaban use   Royetta Asal, PharmD, BCPS Pager [phone] 08/07/2017 12:20 PM

## 2017-08-07 NOTE — Clinical Social Work Note (Signed)
Clinical Social Work Assessment  Patient Details  Name: Morgan Clay MRN: [id number] Date of Birth: Dec 04, 1945  Date of referral:  08/07/17               Reason for consult:  (no formal consult )                Permission sought to share information with:  Family Supports Permission granted to share information::     Name::     daughter [name]  Agency::     Relationship::     Contact Information:     Housing/Transportation Living arrangements for the past 2 months:  Apartment Source of Information:  Patient Patient Interpreter Needed:  None Criminal Activity/Legal Involvement Pertinent to Current Situation/Hospitalization:  No - Comment as needed Significant Relationships:  Adult Children Lives with:  Self Do you feel safe going back to the place where you live?  Yes Need for family participation in patient care:  No (Coment)(pt own decision maker but wants daughter kept updated)  Care giving concerns:  Pt lives at home alone in apartment, one level no stairs required to enter. She reports prior to hospitalization she was independent, "Walking, doing everything for myself, driving." States she had home health. Currently admitted for acute sigmoid diverticulitis and enteritis, had discussed possible need for NG tube but not current plan per notes. Pt also has CHF she reports and history of hep C. Has PCP.   Social Worker assessment / plan:  CSW met with pt to discuss DC plans. Discussed her progress with therapy sessions, and recommendation for SNF vs HHPT. Pt reports that she would like to pursue SNF for rehab if appropriate at DC, given that she lives alone and "would like to get as much therapy as I need and no one is there at home to supervise me." Does state her children could stay with her in the evenings if she opts to go home but would be unsupervised during the day and she is afraid of falls "since I can't walk very well, I've been weak." Pt requested CSW update daughter  on potential plans and gather her input, and CSW left voicemail. Pt reports she would like to go ahead and pursue SNF given it is a process. Requested PASRR, went to manual review. Will follow up and provide addt'l information when requested. Pt will need PASRR approved prior to admitting to a SNF. Will complete FL2 and made referrals to area SNFs.  Plan: SNF at DC for ST rehab CSW barriers: bed offers, PASRR, insurance auth, medical stability for DC  Employment status:  Retired Forensic scientist:  [employer]) PT Recommendations:  Home with [place], [place] / Referral to community resources:  [place]  Patient/Family's Response to care:  Pt appreciative of care but remarks she is ready to be home and tired of being in hospital  Patient/Family's Understanding of and Emotional Response to Diagnosis, Current Treatment, and Prognosis:  Pt shows good understanding of her treatment here and potential plans going forward. Emotionally stable but reports frustration with being in hospital and "really want to go home but know I'm not ready yet"  Emotional Assessment Appearance:  Appears stated age Attitude/Demeanor/Rapport:  Engaged Affect (typically observed):  Calm(frustrated, wants to DC) Orientation:  Oriented to Self, Oriented to Place, Oriented to  Time, Oriented to Situation Alcohol / Substance use:  Not Applicable Psych involvement (Current and /or in the community):  No (Comment)  Discharge  Needs  Concerns to be addressed:  Decision making concerns, Discharge Planning Concerns Readmission within the last 30 days:  Yes Current discharge risk:  Dependent with Mobility Barriers to Discharge:  Continued Medical Work up   [employer], LCSW 08/07/2017, 11:32 AM  [phone]

## 2017-08-07 NOTE — Progress Notes (Signed)
Pharmacy Antibiotic Note  Morgan Clay is a 72 y.o. female presented to the ED on 07/31/2017 with c/o abd pain and SOB. Abd pelvis CT on 3/26 showed findings consistent with mild acute diverticulitis involving the proximal sigmoid colon and bilateral LLL opacities consistent with PNA.  Patient's currently on zosyn for PNA and diverticulitis/enteritis.  Today, 08/07/2017: - day #7 abx - Afeb, WBC wnl -  scr 2.15 ( CrCl is 22 ml/min)  Plan:  Change Zosyn to 3.375 gr IV q8h extended infusion with improved renal function   monitor renal function closely _________________________________________   Height: 4\' 11"  (149.9 cm) Weight: 163 lb 9.3 oz (74.2 kg) IBW/kg (Calculated) : 43.2  Temp (24hrs), Avg:97.9 F (36.6 C), Min:97.6 F (36.4 C), Max:98.4 F (36.9 C)  Recent Labs  Lab 07/31/17 1229 07/31/17 1546 08/01/17 0237  08/03/17 0323 08/03/17 1116 08/04/17 0844 08/05/17 0339 08/06/17 0404 08/07/17 0411  WBC 11.9*  --  14.9*  --  9.8  --   --  5.9  --  8.2  CREATININE 2.19*  --  2.12*   < > 3.30*  --  3.77* 3.34* 2.42* 2.15*  LATICACIDVEN  --  0.90  --   --   --  0.7  --   --   --   --    < > = values in this interval not displayed.    Estimated Creatinine Clearance: 21.7 mL/min (A) (by C-G formula based on SCr of 2.15 mg/dL (H)).    No Known Allergies  Antimicrobials this admission:  3/26 Ceftriaxone / Doxy x1 3/26 Metronidazole (not given) 3/26 Unasyn >> 3/28  3/28 Zosyn >>   Dose adjustments this admission:  --  Microbiology results:  4/1 C.diff negative   Thank you for allowing pharmacy to be a part of this patient's care.  Royetta Asal, PharmD, BCPS Pager [phone] 08/07/2017 12:25 PM

## 2017-08-07 NOTE — Progress Notes (Signed)
[facility] Progress Note     Subjective: CC:  C/o feeling "weak" and "tired" this morning. Also complains of "swelling" everywhere. States her abdominal pain is present but improved compared to admission - described as central, cramping pain. Denies worsening bloating/distention. Reports over 10, green, watery BMs last 24h. Tolerating clears without nausea or vomiting.   Objective: Vital signs in last 24 hours: Temp:  [97.6 F (36.4 C)-98.4 F (36.9 C)] 97.6 F (36.4 C) (04/02 0541) Pulse Rate:  [67-69] 67 (04/02 0541) Resp:  [18] 18 (04/02 0541) BP: (139-167)/(51-61) 144/59 (04/02 0541) SpO2:  [97 %-100 %] 100 % (04/02 0541) Last BM Date: 08/06/17  Intake/Output from previous day: 04/01 0701 - 04/02 0700 In: 2910.4 [I.V.:2760.4; IV Piggyback:150] Out: 300 [Urine:300] Intake/Output this shift: No intake/output data recorded.  PE: Gen:  Alert, NAD, cooperative Card:  Regular rate and rhythm, pedal pulses 2+ BL Pulm:  Normal effort Abd: Soft, TTP epigastric and central abdomen just below umbilicus, mild distention, no rebound tenderness/guarding. bowel sounds hyperactive. Skin: warm and dry, no rashes  Psych: A&Ox3   Lab Results:  Recent Labs    08/05/17 0339 08/07/17 0411  WBC 5.9 8.2  HGB 11.1* 10.6*  HCT 34.8* 32.9*  PLT 184 184   BMET Recent Labs    08/05/17 0339 08/06/17 0404 08/07/17 0411  NA 135  --  142  K 4.0  --  4.0  CL 107  --  116*  CO2 14*  --  14*  GLUCOSE 81  --  92  BUN 68*  --  43*  CREATININE 3.34* 2.42* 2.15*  CALCIUM 8.4*  --  8.6*   PT/INR No results for input(s): LABPROT, INR in the last 72 hours. CMP     Component Value Date/Time   NA 142 08/07/2017 0411   NA 140 07/19/2017 1503   K 4.0 08/07/2017 0411   CL 116 (H) 08/07/2017 0411   CO2 14 (L) 08/07/2017 0411   GLUCOSE 92 08/07/2017 0411   BUN 43 (H) 08/07/2017 0411   BUN 38 (H) 07/19/2017 1503   CREATININE 2.15 (H) 08/07/2017 0411   CREATININE 1.26 (H)  02/25/2016 1607   CALCIUM 8.6 (L) 08/07/2017 0411   PROT 9.3 (H) 07/31/2017 1537   PROT 8.3 07/19/2017 1503   ALBUMIN 4.6 07/31/2017 1537   ALBUMIN 4.9 (H) 07/19/2017 1503   AST 16 07/31/2017 1537   ALT 13 (L) 07/31/2017 1537   ALKPHOS 69 07/31/2017 1537   BILITOT 1.0 07/31/2017 1537   BILITOT 0.3 07/19/2017 1503   GFRNONAA 22 (L) 08/07/2017 0411   GFRAA 26 (L) 08/07/2017 0411   Lipase     Component Value Date/Time   LIPASE 25 07/31/2017 1537       Studies/Results: Dg Abd 2 Views  Result Date: 08/06/2017 CLINICAL DATA:  Small-bowel obstruction. History of an appendectomy. EXAM: ABDOMEN - 2 VIEW COMPARISON:  CT, 08/03/2017 FINDINGS: There are dilated loops of small bowel with no colonic distention, similar to the prior CT. Air-fluid levels are noted on the decubitus view. No free air. Calcified fibroid is noted in the central pelvis. Surgical clips the right lower quadrant reflect prior appendectomy. IMPRESSION: 1. Persistent partial small bowel obstruction. No significant change from the prior CT scan. No free air. Electronically Signed   By: Lajean Manes M.D.   On: 08/06/2017 09:22    Anti-infectives: Anti-infectives (From admission, onward)   Start     Dose/Rate Route Frequency Ordered Stop   08/02/17  1800  piperacillin-tazobactam (ZOSYN) IVPB 2.25 g     2.25 g 100 mL/hr over 30 Minutes Intravenous Every 6 hours 08/02/17 1737     07/31/17 2200  Ampicillin-Sulbactam (UNASYN) 3 g in sodium chloride 0.9 % 100 mL IVPB  Status:  Discontinued     3 g 200 mL/hr over 30 Minutes Intravenous Every 12 hours 07/31/17 2132 08/02/17 1708   07/31/17 1830  cefTRIAXone (ROCEPHIN) 2 g in sodium chloride 0.9 % 100 mL IVPB     2 g 200 mL/hr over 30 Minutes Intravenous  Once 07/31/17 1822 07/31/17 1919   07/31/17 1830  metroNIDAZOLE (FLAGYL) IVPB 500 mg  Status:  Discontinued     500 mg 100 mL/hr over 60 Minutes Intravenous  Once 07/31/17 1822 07/31/17 2053   07/31/17 1830  doxycycline  (VIBRAMYCIN) 100 mg in sodium chloride 0.9 % 250 mL IVPB  Status:  Discontinued     100 mg 125 mL/hr over 120 Minutes Intravenous  Once 07/31/17 1822 07/31/17 2053     Assessment/Plan afib on Eliquis-s/p recent cardioversion Hypothyroidism  OSA on CPAP  History of Hep C s/p Harwani- CT 3/26 w/ nodularity of liver, pt Hx of white stools Uterine fibroids Dibetes Mellitus AKI w/ CKD - SCr 2.15 from 2.42, GFR 26 Lower lobe PNA?- per medicine Leukocytosis- resolved  Abdominal pain Colitis/enteritis unk etiology - WBC normal yesterday - multiple liquid BMs. C.dif negative - AXR with persistent pSBO pattern 4/1, though clinically not having obstructive symptoms. - cont zosyn  FEN: clear liquids, would not advance diet given abdominal tenderness, IVFs; add boost. ID: Unasyn 3/26-3/28, Zosyn 3/28>> VTE: SCD's, heparin  Foley: none  Dispo: no acute surgical needs. pain improving, still with significant diarrhea and unknown etiology of enteritis. Though AXR shows some dilated loops of small bowel, patient is not having nausea/vomiting and is having BMs so I would not place an NG tube. Patient will likely benefit from GI follow up after acute infection resolved for endoscopy/colonoscopy.     LOS: 7 days    [facility] 08/07/2017, 8:43 AM Pager: [phone] Consults: [phone] Mon-Fri 7:00 am-4:30 pm Sat-Sun 7:00 am-11:30 am

## 2017-08-07 NOTE — Progress Notes (Addendum)
[facility] Progress Note    Assessment/ Plan:   1.  AKI on CKD III: Likely hemodynamically mediated in setting of poor PO intake, Entresto, Lasix/ aldactone.  US showing medicorenal disease.  Her creatinine peaked at 3.7 and is down to 2.1 now, baseline creat 1.6- 1.9.  No other suggestions at this point, will sign off. Patient has a renal MD at [place] for f/u (Dr Lorrene Reid).  2.  Diverticulitis: on Zosyn.  CT without abscess or perf.  CCS following.  Zofran changed to phenergan  3.  NICM: hold ACEi and diuretics.  Continuing carvedilol, hydral, and BiDil.  Pressures are a little low- have decreased carvedilol to 12.5 BID for now.  4.  Afib: s/p DCCV 3/19.  On Eliquis 2.5 mg BID as OP--> now Lovenox per pharm c/s and amiodarone  5.  Diarrhea: w/ assoc metabolic acidosis, have order 48 hrs of a bicarb gtt for this at [place] pgr [phone]   08/07/2017, 10:23 AM      Subjective:    Still having diarrhea.  No new c/o.  Creat down 2.1.     Objective:   BP (!) 144/59 (BP Location: Right Arm)   Pulse 67   Temp 97.6 F (36.4 C) (Oral)   Resp 18   Ht 4\' 11"  (1.499 m)   Wt 74.2 kg (163 lb 9.3 oz)   SpO2 100%   BMI 33.04 kg/m   Intake/Output Summary (Last 24 hours) at 08/07/2017 1023 Last data filed at 08/07/2017 0441 Gross per 24 hour  Intake 2910.42 ml  Output 300 ml  Net 2610.42 ml   Weight change:   Physical Exam: GEN NAD, lying in bed HEENT EOMI, PERRL NECK flat neck veins PULM clear bilaterally no c/w/r CV RRR soft systolic murmur ABD obese, slightly distended, hypoactive bowel sounds, no rebound/ guarding, tender diffusely EXT no LE edema NEURO nonfocal SKIN no rashes or lesions    Imaging: Dg Abd 2 Views  Result Date: 08/06/2017 CLINICAL DATA:  Small-bowel obstruction. History of an appendectomy. EXAM: ABDOMEN - 2 VIEW COMPARISON:  CT, 08/03/2017 FINDINGS: There are dilated loops of small bowel with  no colonic distention, similar to the prior CT. Air-fluid levels are noted on the decubitus view. No free air. Calcified fibroid is noted in the central pelvis. Surgical clips the right lower quadrant reflect prior appendectomy. IMPRESSION: 1. Persistent partial small bowel obstruction. No significant change from the prior CT scan. No free air. Electronically Signed   By: Lajean Manes M.D.   On: 08/06/2017 09:22    Labs: BMET Recent Labs  Lab 07/31/17 1229 07/31/17 2115 08/01/17 6644 08/02/17 0944 08/03/17 0323 08/04/17 0844 08/05/17 0339 08/06/17 0404 08/07/17 0411  NA 136  --  137 139 135 140 135  --  142  K 4.4  --  4.4 4.2 4.2 4.6 4.0  --  4.0  CL 102  --  104 104 103 112* 107  --  116*  CO2 22  --  21* 19* 22 17* 14*  --  14*  GLUCOSE 140*  --  112* 120* 105* 90 81  --  92  BUN 54*  --  51* 68* 70* 73* 68*  --  43*  CREATININE 2.19*  --  2.12* 2.51* 3.30* 3.77* 3.34* 2.42* 2.15*  CALCIUM 9.6  --  9.0 9.3 8.4* 8.3* 8.4*  --  8.6*  PHOS  --  3.3 3.4  --   --   --   --   --   --  CBC Recent Labs  Lab 08/01/17 0237 08/03/17 0323 08/05/17 0339 08/07/17 0411  WBC 14.9* 9.8 5.9 8.2  HGB 13.3 11.2* 11.1* 10.6*  HCT 40.0 34.4* 34.8* 32.9*  MCV 89.3 89.1 89.5 88.9  PLT 174 179 184 184    Medications:    . amiodarone  200 mg Oral Daily  . carvedilol  12.5 mg Oral BID WC  . enoxaparin (LOVENOX) injection  70 mg Subcutaneous Q24H  . feeding supplement  1 Container Oral TID BM  . insulin aspart  0-5 Units Subcutaneous QHS  . insulin aspart  0-9 Units Subcutaneous TID WC  . isosorbide-hydrALAZINE  1 tablet Oral BID  . levothyroxine  125 mcg Oral QAC breakfast  . pantoprazole (PROTONIX) IV  40 mg Intravenous Q24H  . sertraline  50 mg Oral Daily

## 2017-08-08 ENCOUNTER — Inpatient Hospital Stay (HOSPITAL_COMMUNITY): Payer: Medicare Other

## 2017-08-08 DIAGNOSIS — M79602 Pain in left arm: Secondary | ICD-10-CM | POA: Diagnosis not present

## 2017-08-08 DIAGNOSIS — M7989 Other specified soft tissue disorders: Secondary | ICD-10-CM

## 2017-08-08 DIAGNOSIS — J189 Pneumonia, unspecified organism: Secondary | ICD-10-CM | POA: Insufficient documentation

## 2017-08-08 HISTORY — DX: Other specified soft tissue disorders: M79.89

## 2017-08-08 HISTORY — DX: Pain in left arm: M79.602

## 2017-08-08 LAB — BASIC METABOLIC PANEL
ANION GAP: 17 — AB (ref 5–15)
BUN: 41 mg/dL — ABNORMAL HIGH (ref 6–20)
CALCIUM: 8.8 mg/dL — AB (ref 8.9–10.3)
CO2: 15 mmol/L — ABNORMAL LOW (ref 22–32)
Chloride: 109 mmol/L (ref 101–111)
Creatinine, Ser: 2.21 mg/dL — ABNORMAL HIGH (ref 0.44–1.00)
GFR, EST AFRICAN AMERICAN: 25 mL/min — AB (ref >60)
GFR, EST NON AFRICAN AMERICAN: 22 mL/min — AB (ref >60)
Glucose, Bld: 103 mg/dL — ABNORMAL HIGH (ref 65–99)
Potassium: 3.6 mmol/L (ref 3.5–5.1)
Sodium: 141 mmol/L (ref 135–145)

## 2017-08-08 LAB — GLUCOSE, CAPILLARY
Glucose-Capillary: 87 mg/dL (ref 65–99)
Glucose-Capillary: 122 mg/dL — ABNORMAL HIGH (ref 65–99)
Glucose-Capillary: 139 mg/dL — ABNORMAL HIGH (ref 65–99)
Glucose-Capillary: 126 mg/dL — ABNORMAL HIGH (ref 65–99)

## 2017-08-08 MED ORDER — MENTHOL 3 MG MT LOZG
1.0000 | LOZENGE | OROMUCOSAL | Status: DC | PRN
  Filled 2017-08-08: qty 9

## 2017-08-08 MED ORDER — FUROSEMIDE 10 MG/ML IJ SOLN
20.0000 mg | Freq: Every day | INTRAMUSCULAR | Status: DC
  Administered 2017-08-08 – 2017-08-09 (×2): 20 mg via INTRAVENOUS
  Filled 2017-08-08 (×3): qty 2

## 2017-08-08 MED ORDER — TRAMADOL HCL 50 MG PO TABS
100.0000 mg | ORAL_TABLET | Freq: Two times a day (BID) | ORAL | Status: DC | PRN
  Administered 2017-08-10 – 2017-08-15 (×5): 100 mg via ORAL
  Filled 2017-08-08 (×5): qty 2

## 2017-08-08 MED ORDER — PHENOL 1.4 % MT LIQD
1.0000 | OROMUCOSAL | Status: DC | PRN
  Administered 2017-08-08: 1 via OROMUCOSAL
  Filled 2017-08-08: qty 177

## 2017-08-08 NOTE — Progress Notes (Signed)
Subjective: Does not feel well today.  Complains of significant swelling of her LUE and horrible pain in her throat when swallowing.  She is taking in some of her clear liquids.  Still has some abdominal pain but states it is somewhat better than admission.  Objective: Vital signs in last 24 hours: Temp:  [97.7 F (36.5 C)-99.2 F (37.3 C)] 99.2 F (37.3 C) (04/03 0454) Pulse Rate:  [66-71] 71 (04/03 0454) Resp:  [16-18] 16 (04/03 0454) BP: (135-180)/(60-74) 179/68 (04/03 0454) SpO2:  [96 %-100 %] 100 % (04/03 0454) Weight:  [172 lb 9.9 oz (78.3 kg)] 172 lb 9.9 oz (78.3 kg) (04/03 0438) Last BM Date: 08/06/17  Intake/Output from previous day: 04/02 0701 - 04/03 0700 In: 1730 [P.O.:480; I.V.:1200; IV Piggyback:50] Out: -  Intake/Output this shift: No intake/output data recorded.  PE: Mouth: tongue has white on it that does not flake off, but the roof of her mouth has no evidence of thrush.  Unable to see throat Abd: soft, still tender diffusely, but no guarding or rebound. Some BS present, obese Ext: LUE with ecchymosis in antecubital space with significant edema of her entire arm from shoulder to her hand.  Lab Results:  Recent Labs    08/07/17 0411  WBC 8.2  HGB 10.6*  HCT 32.9*  PLT 184   BMET Recent Labs    08/07/17 0411 08/08/17 0350  NA 142 141  K 4.0 3.6  CL 116* 109  CO2 14* 15*  GLUCOSE 92 103*  BUN 43* 41*  CREATININE 2.15* 2.21*  CALCIUM 8.6* 8.8*   PT/INR No results for input(s): LABPROT, INR in the last 72 hours. CMP     Component Value Date/Time   NA 141 08/08/2017 0350   NA 140 07/19/2017 1503   K 3.6 08/08/2017 0350   CL 109 08/08/2017 0350   CO2 15 (L) 08/08/2017 0350   GLUCOSE 103 (H) 08/08/2017 0350   BUN 41 (H) 08/08/2017 0350   BUN 38 (H) 07/19/2017 1503   CREATININE 2.21 (H) 08/08/2017 0350   CREATININE 1.26 (H) 02/25/2016 1607   CALCIUM 8.8 (L) 08/08/2017 0350   PROT 9.3 (H) 07/31/2017 1537   PROT 8.3 07/19/2017 1503     ALBUMIN 4.6 07/31/2017 1537   ALBUMIN 4.9 (H) 07/19/2017 1503   AST 16 07/31/2017 1537   ALT 13 (L) 07/31/2017 1537   ALKPHOS 69 07/31/2017 1537   BILITOT 1.0 07/31/2017 1537   BILITOT 0.3 07/19/2017 1503   GFRNONAA 22 (L) 08/08/2017 0350   GFRAA 25 (L) 08/08/2017 0350   Lipase     Component Value Date/Time   LIPASE 25 07/31/2017 1537       Studies/Results: No results found.  Anti-infectives: Anti-infectives (From admission, onward)   Start     Dose/Rate Route Frequency Ordered Stop   08/07/17 2000  piperacillin-tazobactam (ZOSYN) IVPB 3.375 g     3.375 g 12.5 mL/hr over 240 Minutes Intravenous Every 8 hours 08/07/17 1227     08/02/17 1800  piperacillin-tazobactam (ZOSYN) IVPB 2.25 g  Status:  Discontinued     2.25 g 100 mL/hr over 30 Minutes Intravenous Every 6 hours 08/02/17 1737 08/07/17 1226   07/31/17 2200  Ampicillin-Sulbactam (UNASYN) 3 g in sodium chloride 0.9 % 100 mL IVPB  Status:  Discontinued     3 g 200 mL/hr over 30 Minutes Intravenous Every 12 hours 07/31/17 2132 08/02/17 1708   07/31/17 1830  cefTRIAXone (ROCEPHIN) 2 g in  sodium chloride 0.9 % 100 mL IVPB     2 g 200 mL/hr over 30 Minutes Intravenous  Once 07/31/17 1822 07/31/17 1919   07/31/17 1830  metroNIDAZOLE (FLAGYL) IVPB 500 mg  Status:  Discontinued     500 mg 100 mL/hr over 60 Minutes Intravenous  Once 07/31/17 1822 07/31/17 2053   07/31/17 1830  doxycycline (VIBRAMYCIN) 100 mg in sodium chloride 0.9 % 250 mL IVPB  Status:  Discontinued     100 mg 125 mL/hr over 120 Minutes Intravenous  Once 07/31/17 1822 07/31/17 2053       Assessment/Plan afib on Eliquis-s/p recent cardioversion Hypothyroidism  OSA on CPAP  History of Hep C s/p Harwani- CT 3/26 w/ nodularity of liver, pt Hx of white stools Uterine fibroids Dibetes Mellitus AKI w/ CKD - SCr 2.25 Lower lobe PNA?- per medicine Leukocytosis- resolved LUE edema - discussed with Dr. Karleen Hampshire, will let her evaluate and work this up,  may need doppler study Odynophagia - do not necessarily see thrust in her throat, but can't see well.  Discussed with Dr. Karleen Hampshire as well.  Will let her further evaluate this as well.   Abdominal pain Colitis/enteritis unk etiology -WBC normal yesterday - multiple liquid BMs. C.dif negative - AXR with persistent pSBO pattern 4/1, though clinically not having obstructive symptoms. Will advance to full liquids - cont zosyn  FEN: full liquids/Boost/IVFs ID: Unasyn 3/26-3/28, Zosyn 3/28>>? Duration given normal WBC and 9 days of abx therapy so far VTE: SCD's,heparin Foley: none  Dispo: no acute surgical needs.  The patient has multiple new findings and complaints today such as odynophagia, swollen LUE, and abdominal symptoms.  It is unclear if all of this is related, but these are all concerning nonetheless.  Her WBC is normal and she has had 9 days of IV abx therapy.  Not sure how much more abx she needs.  Will continue to follow and see how she tolerates her full liquids.    LOS: 8 days    Henreitta Cea , [facility] 08/08/2017, 10:28 AM Pager: [phone]

## 2017-08-08 NOTE — Progress Notes (Signed)
Pt states she has discussed disposition plan with daughter and now planning to go home and have daughter/son stay with her at night. Is interested in [facility] for PT.  Sharren Bridge, MSW, LCSW Clinical Social Work 08/08/2017 [phone]

## 2017-08-08 NOTE — Progress Notes (Addendum)
Per CSW pt would like to dc home now with [facility]. This CM spoke with pt at bedside and daughter via phone to ensure pt would have 24hr care. Choice offered for home health services and [facility] chosen. [facility] rep alerted of referral. Will need orders for HHPT/OT/Aide. Pt requesting RW. Order received and [facility] rep alerted of order. Marney Doctor RN,BSN,NCM [phone]

## 2017-08-08 NOTE — Progress Notes (Signed)
LUE venous duplex prelim: negative for DVT and SVT. Landry Mellow, RDMS, RVT

## 2017-08-08 NOTE — Progress Notes (Signed)
Physical Therapy Treatment Patient Details Name: Morgan Clay MRN: [id number] DOB: June 24, 1945 Today's Date: 08/08/2017    History of Present Illness 72 yo female admitted to [facility] on 07/31/2017 with abdominal pain.  She is foind to have Acute sigmoid diverticulitis and enteritis.  She has been having pain and limited mobilty with frequent diarrhea     PT Comments    Pt reports 10/10 pain in LUE, L side, and L foot. She had just ambulated to bedside commode and then to recliner when I entered her room. She was too fatigued to attempt further ambulation and BP was elevated, so performed supine BLE strengthening exercises. Pt not progressing with mobility 2* pain and fatigue, Edema noted in L hand, pt guards L hand.    Follow Up Recommendations  SNF     Equipment Recommendations  Rolling walker with 5" wheels    Recommendations for Other Services       Precautions / Restrictions Precautions Precautions: Fall Precaution Comments: fatigues quickly Restrictions Weight Bearing Restrictions: No    Mobility  Bed Mobility               General bed mobility comments: up walking in room with NT  Transfers Overall transfer level: Needs assistance Equipment used: Rolling walker (2 wheeled) Transfers: Sit to/from Stand Sit to Stand: Min assist         General transfer comment: min/guard and VCs for safety for stand to sit  Ambulation/Gait         Gait velocity: slow   General Gait Details: pt too fatigued from walking to bedside commode and back to recliner to attempt further ambulation   Stairs            Wheelchair Mobility    Modified Rankin (Stroke Patients Only)       Balance                                            Cognition Arousal/Alertness: Awake/alert Behavior During Therapy: WFL for tasks assessed/performed Overall Cognitive Status: Within Functional Limits for tasks assessed                                        Exercises General Exercises - Lower Extremity Ankle Circles/Pumps: AAROM;Both;15 reps;Supine Short Arc Quad: AROM;AAROM;Both;10 reps;Supine Heel Slides: AAROM;Both;10 reps;Supine Hip ABduction/ADduction: AAROM;Both;10 reps;Supine    General Comments        Pertinent Vitals/Pain Pain Assessment: 0-10 Pain Score: 10-Worst pain ever Pain Location: L arm, side and foot Pain Descriptors / Indicators: Sore Pain Intervention(s): Limited activity within patient's tolerance;Monitored during session;RN gave pain meds during session    Home Living                      Prior Function            PT Goals (current goals can now be found in the care plan section) Acute Rehab PT Goals Patient Stated Goal: pt wants to go home  PT Goal Formulation: With patient Time For Goal Achievement: 08/12/17 Progress towards PT goals: Not progressing toward goals - comment(decreased activity tolerance)    Frequency    Min 2X/week      PT Plan Current plan remains appropriate    Co-evaluation  AM-PAC PT "6 Clicks" Daily Activity  Outcome Measure  Difficulty turning over in bed (including adjusting bedclothes, sheets and blankets)?: A Lot Difficulty moving from lying on back to sitting on the side of the bed? : A Lot Difficulty sitting down on and standing up from a chair with arms (e.g., wheelchair, bedside commode, etc,.)?: A Lot Help needed moving to and from a bed to chair (including a wheelchair)?: A Little Help needed walking in hospital room?: A Lot Help needed climbing 3-5 steps with a railing? : A Lot 6 Click Score: 13    End of Session   Activity Tolerance: Patient limited by fatigue;Patient limited by pain Patient left: with call bell/phone within reach;in chair Nurse Communication: Mobility status PT Visit Diagnosis: Pain;Difficulty in walking, not elsewhere classified (R26.2)     Time: 8003-4917 PT Time Calculation (min) (ACUTE  ONLY): 22 min  Charges:  $Therapeutic Exercise: 8-22 mins                    G Codes:          Philomena Doheny 08/08/2017, 11:48 AM [phone]

## 2017-08-08 NOTE — Progress Notes (Addendum)
PROGRESS NOTE    Morgan Clay  GBT:[id number] DOB: [birth year] DOA: 07/31/2017 PCP: [name], DO    Brief Narrative: Morgan Clay is a 72 y.o. female with medical history significant for NICM (last EF 45%) s/p CRT-D, A. fib on Eliquis with recent DCCV on 3/19, OSA on CPAP, HCV status post Harvoni (viremic response not documented), hypothyroidism, depression and diet managed type 2 diabetes mellitus who presents to the ED with 5 days of diffuse, generalized abdominal pain, with associated anorexia, nausea and one episode of profuse nonbloody, nonbilious emesis on the day of presentation.  CT abdomen and pelvis without contrast showed Suspected mild acute diverticulitis involving the proximal sigmoid colon. Scattered small bowel air-fluid levels and with questionable wall thickening of a few left-sided small bowel loops, query enteritis.  Bilateral lower lobe lung opacities compatible with pneumonia.    Assessment & Plan:   Active Problems:   Hypothyroidism   Obesity   OBSTRUCTIVE SLEEP APNEA   Type 2 diabetes, controlled, with renal manifestation (HCC)   Chronic kidney disease - followed by [facility]   Chronic obstructive pulmonary disease ([place])   Hypertension associated with diabetes ([place])   Persistent atrial fibrillation (HCC)   Diverticulitis   ARF (acute renal failure) (HCC)   Enteritis   Partial small bowel obstruction (HCC)  Acute sigmoid diverticulitis and enteritis Was initially on unasyn, changed to zosyn on 3/28. She has completed 7 days of IV zosyn in addition to 2 days of Unasyn.  Patient continues to have persistent lower abdominal pain so we will not de-escalate the antibiotics or  advance her diet.  Continue with liquid diet, pain control,  antiemetics as needed and IV fluids. Surgery consulted for persistent abd pain, nausea, and vomiting. Repeat CT abdomen and pelvis ordered by surgery for persistent symptoms. Showed small bowel loops dilatation  consistent with SBO.  Repeat lactate is normal.  Wbc count continues to be wnl and she remains afebrile.  She reports her nausea, vomiting has resolved. and abd pain has improved but not completely resolved.  reglan added on 4.2 for nausea .   She reports she had 7 bowel movements yesterday , and 4 this am.     Acute renal failure patient's baseline creatinine appears to be around 1.5,  Admitted with a creatinine around 2, slowly worsened to 3.7 , now it has started to improve with fluids to 2.2.Holding Entresto, Aldactone and Lasix since admission ,  avoid nephrotoxins.   Strict in and out and daily weights.  Continue to monitor creatinine. UA is negative for infection.  Suspect prerenal as patient has been hypotensive, dehydrated from persistent diarrhea.   Nephrology consulted for recommendations, recommended another 24 hours of bicarb drip. Appreciate their assistance.    Chronic systolic heart failure She appears slightly fluid overloaded from the NS, will restart her lasix, should help with the interstitial edema in the lungs and decrease the upper and lower extremity edema. Monitor renal parameters while on lasix.   Elevated troponin's  Sec to demand ischemia. She denies any chest pain.   Chronic atrial fibrillation rate controlled , eliquis discontinued and started her lovenox inj because of her renal parameters. Once her creatinine is back to baseline , will restart her on eliquis.    Hypothyroidism resume Synthroid.   Obstructive sleep apnea on CPAP at night continue the same.   History of hepatitis C status post Harwani.  Outpatient follow-up with PCP..   Diabetes mellitus  CBG (last 3)  Recent Labs    08/07/17 2135 08/08/17 0753 08/08/17 1214  GLUCAP 93 87 122*   Well-controlled. Diet controlled. Resume SSI.  No changes to medications.  Mild anemia normocytic.  Her baseline hemoglobin around 12. Currently its 10.6 No signs of bleeding.    Accelerated  Hypertension:  - on IV hydralazine prn, isordil and lasix added today.  Recheck the BP now.    Left upper extremity swelling with some bruising over the ante cubital fossa:  VAS US duplex of the upper extremity did not show any SVT or DVT.  She reports she had blood drawn from the arm after which the bruising started.  Recommend soft tissue ultrasound of the left arm to evaluate further if the swelling does not improve with elevated and lasix.    Pt reports sore throat since last night: - Cepacol and chloraseptic sprays. - coated tongue, but no signs of oral candidiasis.  - she has been on IV antibiotics for 9 days.  - monitor.   DVT prophylaxis: lovenox.  Code Status: Full code Family Communication: daughter at bedside Disposition Plan: Possibly home once sbo and diverticulitis resolved.   Consultants:   Surgery.    Nephrology  Procedures: None  Antimicrobials: zosyn since 3/28.    Subjective: Nasuea, vomiting resolved. Diarrhea persistent, left upper extremity swelling since 2 days,.  Sore throat since last night.   Objective: Vitals:   08/08/17 0438 08/08/17 0454 08/08/17 1100 08/08/17 1111  BP:  (!) 179/68 (!) 210/82 (!) 204/72  Pulse:  71    Resp:  16    Temp:  99.2 F (37.3 C)    TempSrc:  Oral    SpO2:  100%    Weight: 78.3 kg (172 lb 9.9 oz)     Height:        Intake/Output Summary (Last 24 hours) at 08/08/2017 1550 Last data filed at 08/08/2017 0600 Gross per 24 hour  Intake 1250 ml  Output -  Net 1250 ml   Filed Weights   08/05/17 0737 08/06/17 0553 08/08/17 0438  Weight: 74.2 kg (163 lb 9.3 oz) 74.2 kg (163 lb 9.3 oz) 78.3 kg (172 lb 9.9 oz)    Examination:  General exam: uncomfortable from the left upper extremity pain.  Respiratory system: scattered rales, no wheezing heard, air entry fair.  Cardiovascular system: S1 & S2 heard, RRR. No JVD, murmurs . trace pedal edema. Gastrointestinal system: Abdomen continues to be distended, soft, mild  gen tenderness, with good bowel sounds. .  Central nervous system: Alert and oriented.  Nonfocal Extremities: left upper extremity swollen with some bruising over the ante cubital fossa, trace pedal edema. Skin: No rashes, lesions or ulcers Psychiatry: appears Anxious.     Data Reviewed: I have personally reviewed following labs and imaging studies  CBC: Recent Labs  Lab 08/03/17 0323 08/05/17 0339 08/07/17 0411  WBC 9.8 5.9 8.2  HGB 11.2* 11.1* 10.6*  HCT 34.4* 34.8* 32.9*  MCV 89.1 89.5 88.9  PLT 179 184 683   Basic Metabolic Panel: Recent Labs  Lab 08/03/17 0323 08/04/17 0844 08/05/17 0339 08/06/17 0404 08/07/17 0411 08/08/17 0350  NA 135 140 135  --  142 141  K 4.2 4.6 4.0  --  4.0 3.6  CL 103 112* 107  --  116* 109  CO2 22 17* 14*  --  14* 15*  GLUCOSE 105* 90 81  --  92 103*  BUN 70* 73* 68*  --  43* 41*  CREATININE 3.30*  3.77* 3.34* 2.42* 2.15* 2.21*  CALCIUM 8.4* 8.3* 8.4*  --  8.6* 8.8*   GFR: Estimated Creatinine Clearance: 21.7 mL/min (A) (by C-G formula based on SCr of 2.21 mg/dL (H)). Liver Function Tests: No results for input(s): AST, ALT, ALKPHOS, BILITOT, PROT, ALBUMIN in the last 168 hours. No results for input(s): LIPASE, AMYLASE in the last 168 hours. No results for input(s): AMMONIA in the last 168 hours. Coagulation Profile: No results for input(s): INR, PROTIME in the last 168 hours. Cardiac Enzymes: No results for input(s): CKTOTAL, CKMB, CKMBINDEX, TROPONINI in the last 168 hours. BNP (last 3 results) No results for input(s): PROBNP in the last 8760 hours. HbA1C: No results for input(s): HGBA1C in the last 72 hours. CBG: Recent Labs  Lab 08/07/17 1149 08/07/17 1704 08/07/17 2135 08/08/17 0753 08/08/17 1214  GLUCAP 99 98 93 87 122*   Lipid Profile: No results for input(s): CHOL, HDL, LDLCALC, TRIG, CHOLHDL, LDLDIRECT in the last 72 hours. Thyroid Function Tests: No results for input(s): TSH, T4TOTAL, FREET4, T3FREE, THYROIDAB in  the last 72 hours. Anemia Panel: No results for input(s): VITAMINB12, FOLATE, FERRITIN, TIBC, IRON, RETICCTPCT in the last 72 hours. Sepsis Labs: Recent Labs  Lab 08/03/17 1116  LATICACIDVEN 0.7    Recent Results (from the past 240 hour(s))  C difficile quick scan w PCR reflex     Status: None   Collection Time: 08/06/17 10:16 AM  Result Value Ref Range Status   C Diff antigen NEGATIVE NEGATIVE Final   C Diff toxin NEGATIVE NEGATIVE Final   C Diff interpretation No C. difficile detected.  Final    Comment: Performed at [facility], [place] [address], [place], [place] [address]         Radiology Studies: Dg Chest 2 View  Result Date: 08/08/2017 CLINICAL DATA:  72 year old female with shortness of breath and left upper extremity swelling. EXAM: CHEST - 2 VIEW COMPARISON:  07/31/2017 and earlier. FINDINGS: Bilateral basilar predominant increased interstitial and patchy indistinct perihilar opacity. Small bilateral pleural effusions are suspected. Lung volumes are lower than in November 2018. No pneumothorax. Stable cardiomegaly and mediastinal contours. Stable left chest cardiac AICD. No acute osseous abnormality identified. Negative visible bowel gas pattern. IMPRESSION: Acute perihilar pulmonary edema suspected with small bilateral pleural effusions. Acute viral/atypical respiratory infection felt less likely. Electronically Signed   By: Genevie Ann M.D.   On: 08/08/2017 13:23        Scheduled Meds: . amiodarone  200 mg Oral Daily  . carvedilol  12.5 mg Oral BID WC  . enoxaparin (LOVENOX) injection  70 mg Subcutaneous Q24H  . feeding supplement  1 Container Oral TID BM  . insulin aspart  0-5 Units Subcutaneous QHS  . insulin aspart  0-9 Units Subcutaneous TID WC  . isosorbide-hydrALAZINE  1 tablet Oral BID  . levothyroxine  125 mcg Oral QAC breakfast  . metoCLOPramide (REGLAN) injection  5 mg Intravenous Q8H  . pantoprazole (PROTONIX) IV  40 mg Intravenous Q24H    . sertraline  50 mg Oral Daily   Continuous Infusions: . piperacillin-tazobactam (ZOSYN)  IV 3.375 g (08/08/17 1318)  .  sodium bicarbonate (isotonic) infusion in sterile water 100 mL/hr at 08/08/17 1053     LOS: 8 days    Time spent: 35 minutes.     Hosie Poisson, MD [facility] Pager [phone]  If 7PM-7AM, please contact night-coverage [email or web address] Password [email or web address] 08/08/2017, 3:50 PM

## 2017-08-09 ENCOUNTER — Inpatient Hospital Stay (HOSPITAL_COMMUNITY): Payer: Medicare Other

## 2017-08-09 DIAGNOSIS — K529 Noninfective gastroenteritis and colitis, unspecified: Secondary | ICD-10-CM

## 2017-08-09 DIAGNOSIS — K5792 Diverticulitis of intestine, part unspecified, without perforation or abscess without bleeding: Secondary | ICD-10-CM

## 2017-08-09 DIAGNOSIS — J810 Acute pulmonary edema: Secondary | ICD-10-CM | POA: Diagnosis present

## 2017-08-09 DIAGNOSIS — K56600 Partial intestinal obstruction, unspecified as to cause: Secondary | ICD-10-CM

## 2017-08-09 HISTORY — DX: Acute pulmonary edema: J81.0

## 2017-08-09 LAB — COMPREHENSIVE METABOLIC PANEL
ALK PHOS: 41 U/L (ref 38–126)
ALT: 13 U/L — AB (ref 14–54)
ANION GAP: 13 (ref 5–15)
AST: 15 U/L (ref 15–41)
Albumin: 3.3 g/dL — ABNORMAL LOW (ref 3.5–5.0)
BUN: 37 mg/dL — ABNORMAL HIGH (ref 6–20)
CALCIUM: 8.7 mg/dL — AB (ref 8.9–10.3)
CO2: 24 mmol/L (ref 22–32)
CREATININE: 2.01 mg/dL — AB (ref 0.44–1.00)
Chloride: 102 mmol/L (ref 101–111)
GFR calc non Af Amer: 24 mL/min — ABNORMAL LOW (ref >60)
GFR, EST AFRICAN AMERICAN: 28 mL/min — AB (ref >60)
Glucose, Bld: 134 mg/dL — ABNORMAL HIGH (ref 65–99)
Potassium: 3 mmol/L — ABNORMAL LOW (ref 3.5–5.1)
Sodium: 139 mmol/L (ref 135–145)
Total Bilirubin: 0.6 mg/dL (ref 0.3–1.2)
Total Protein: 7.8 g/dL (ref 6.5–8.1)

## 2017-08-09 LAB — BLOOD GAS, ARTERIAL
ACID-BASE DEFICIT: 1.4 mmol/L (ref 0.0–2.0)
BICARBONATE: 22.4 mmol/L (ref 20.0–28.0)
Drawn by: 235321
O2 Content: 3 L/min
O2 Saturation: 90.1 %
PATIENT TEMPERATURE: 98.6
PH ART: 7.41 (ref 7.350–7.450)
PO2 ART: 57.9 mmHg — AB (ref 83.0–108.0)
pCO2 arterial: 36 mmHg (ref 32.0–48.0)

## 2017-08-09 LAB — GLUCOSE, CAPILLARY
GLUCOSE-CAPILLARY: 118 mg/dL — AB (ref 65–99)
GLUCOSE-CAPILLARY: 127 mg/dL — AB (ref 65–99)
Glucose-Capillary: 143 mg/dL — ABNORMAL HIGH (ref 65–99)
Glucose-Capillary: 138 mg/dL — ABNORMAL HIGH (ref 65–99)

## 2017-08-09 LAB — BASIC METABOLIC PANEL
ANION GAP: 14 (ref 5–15)
BUN: 39 mg/dL — AB (ref 6–20)
CO2: 21 mmol/L — ABNORMAL LOW (ref 22–32)
Calcium: 8.6 mg/dL — ABNORMAL LOW (ref 8.9–10.3)
Chloride: 103 mmol/L (ref 101–111)
Creatinine, Ser: 2.11 mg/dL — ABNORMAL HIGH (ref 0.44–1.00)
GFR, EST AFRICAN AMERICAN: 26 mL/min — AB (ref >60)
GFR, EST NON AFRICAN AMERICAN: 23 mL/min — AB (ref >60)
Glucose, Bld: 118 mg/dL — ABNORMAL HIGH (ref 65–99)
POTASSIUM: 3.2 mmol/L — AB (ref 3.5–5.1)
Sodium: 138 mmol/L (ref 135–145)

## 2017-08-09 LAB — CBC
HCT: 32 % — ABNORMAL LOW (ref 36.0–46.0)
Hemoglobin: 11 g/dL — ABNORMAL LOW (ref 12.0–15.0)
MCH: 29.3 pg (ref 26.0–34.0)
MCHC: 34.4 g/dL (ref 30.0–36.0)
MCV: 85.3 fL (ref 78.0–100.0)
PLATELETS: 192 10*3/uL (ref 150–400)
RBC: 3.75 MIL/uL — ABNORMAL LOW (ref 3.87–5.11)
RDW: 15.3 % (ref 11.5–15.5)
WBC: 7.2 10*3/uL (ref 4.0–10.5)

## 2017-08-09 LAB — TROPONIN I: TROPONIN I: 0.08 ng/mL — AB (ref <0.03)

## 2017-08-09 MED ORDER — APIXABAN 5 MG PO TABS
5.0000 mg | ORAL_TABLET | Freq: Two times a day (BID) | ORAL | Status: DC
  Administered 2017-08-09 – 2017-08-17 (×16): 5 mg via ORAL
  Filled 2017-08-09 (×16): qty 1

## 2017-08-09 MED ORDER — MORPHINE SULFATE (PF) 4 MG/ML IV SOLN
2.0000 mg | INTRAVENOUS | Status: DC | PRN
  Administered 2017-08-09: 2 mg via INTRAVENOUS
  Administered 2017-08-11 – 2017-08-12 (×3): 3 mg via INTRAVENOUS
  Filled 2017-08-09 (×4): qty 1

## 2017-08-09 MED ORDER — POTASSIUM CHLORIDE CRYS ER 20 MEQ PO TBCR
40.0000 meq | EXTENDED_RELEASE_TABLET | Freq: Once | ORAL | Status: DC
  Filled 2017-08-09: qty 2

## 2017-08-09 MED ORDER — FUROSEMIDE 10 MG/ML IJ SOLN
40.0000 mg | Freq: Two times a day (BID) | INTRAMUSCULAR | Status: DC
  Administered 2017-08-09 – 2017-08-11 (×4): 40 mg via INTRAVENOUS
  Filled 2017-08-09 (×4): qty 4

## 2017-08-09 MED ORDER — NITROGLYCERIN 0.4 MG SL SUBL
SUBLINGUAL_TABLET | SUBLINGUAL | Status: AC
  Filled 2017-08-09: qty 1

## 2017-08-09 MED ORDER — FUROSEMIDE 10 MG/ML IJ SOLN
40.0000 mg | Freq: Once | INTRAMUSCULAR | Status: AC
  Administered 2017-08-09: 40 mg via INTRAVENOUS
  Filled 2017-08-09: qty 4

## 2017-08-09 MED ORDER — POTASSIUM CHLORIDE CRYS ER 20 MEQ PO TBCR
40.0000 meq | EXTENDED_RELEASE_TABLET | Freq: Once | ORAL | Status: AC
Start: 2017-08-09 — End: 2017-08-09
  Administered 2017-08-09: 40 meq via ORAL

## 2017-08-09 MED ORDER — FLORANEX PO PACK
1.0000 g | PACK | Freq: Three times a day (TID) | ORAL | Status: DC
  Administered 2017-08-09 – 2017-08-17 (×22): 1 g via ORAL
  Filled 2017-08-09 (×26): qty 1

## 2017-08-09 MED ORDER — NITROGLYCERIN 2 % TD OINT
1.0000 [in_us] | TOPICAL_OINTMENT | Freq: Once | TRANSDERMAL | Status: AC
  Administered 2017-08-09: 1 [in_us] via TOPICAL
  Filled 2017-08-09: qty 30

## 2017-08-09 NOTE — Progress Notes (Signed)
Upon entering PT room she was sitting up in the chair and stated that she didn't  Feel well "I am having trouble breathing.". I  BP 176/69 , HR 72 RRR, R- 24 , TEMP 97.7. Lungs were dimished in the bases with expiratory wheezes.I tested Dr Joesph Fillers and gave him report and order received and followed thru 5:47pm I paged Dr Lambert Mody because patient was still complaining of shortness of breath and chest pained orders received at 1900 Dr Joesph Fillers was paged he advised tho call rapid response Bp 142/117 Oxygen Sat 92. I put on 2.5 L of O2 O2 stat came up to 100 rapid response was called and Dr Joesph Fillers and arrived to room 7:30 pm patient went to stepdown 1935 report was giving to [employer]

## 2017-08-09 NOTE — Progress Notes (Signed)
Patient Demographics:    Morgan Clay, is a 72 y.o. female, DOB - 03/05/1946, FIE:[id number]  Admit date - 07/31/2017   Admitting Physician Adam Sharene Butters, MD  Outpatient Primary MD for the patient is [name], DO  LOS - 9  Chief Complaint  Patient presents with  . Abdominal Pain  . Shortness of Breath        Subjective:    Morgan Clay today has no fevers, no emesis,  No chest pain, patient's daughter at bedside, questions answered, abdominal pain is better, tolerating oral intake, had BM, patient is complaining of some chest discomfort later in the evening  Assessment  & Plan :    Active Problems:   Hypothyroidism   Obesity   OBSTRUCTIVE SLEEP APNEA   Type 2 diabetes, controlled, with renal manifestation (HCC)   Chronic kidney disease - followed by [facility]   Chronic obstructive pulmonary disease ([place])   Hypertension associated with diabetes ([place])   Persistent atrial fibrillation (HCC)   Diverticulitis   ARF (acute renal failure) (HCC)   Enteritis   Partial small bowel obstruction (HCC)   Pain and swelling of left upper extremity   Brief Narrative: Morgan Clay a 73 y.o.femalewith medical history significantfor NICM (last EF 45%) s/pCRT-D, A. fib on Eliquis with recent DCCV on 07/24/17, OSA on CPAP, HCV status post Harvoni (viremic response not documented), hypothyroidism, depression and diet managed type 2 diabetes mellitus who presents to the ED with 5 days of diffuse, generalized abdominal pain, with associated anorexia, nausea and one episode of profuse nonbloody, nonbilious emesis on the day of presentation.  CT abdomen and pelvis without contrast showed Suspected mild acute diverticulitis involving the proximal sigmoid colon. Scattered small bowel air-fluid levels and with questionable wall thickening of a few left-sided small bowel loops, query enteritis.   Admitted on 07/31/2017 with the above findings with concerns about possible partial SBO versus diverticulitis versus colitis as well as concerns about possible pneumonia    Plan:- 1)Acute Sigmoid Diverticulitis Vs Colitis--tolerating GI soft diet well, having bowel movements, fevers,  She has completed 7 days of IV zosyn in addition to 2 days of Unasyn.    Surgical consult appreciated, surgery recommended discontinuation of antibiotics, surgical service signed off as of 08/09/2017.  Abdominal imaging studies continues to suggest possible small bowel obstruction, however clinically patient is tolerating oral intake and having bowel movements   2)AKI----acute kidney injury on CKD stage III -     creatinine on admission= 2.1 ,   baseline creatinine = 1.8   , creatinine is now= 2.1   , creatinine peaked at 3.77,  Avoid nephrotoxic agents/dehydration/hypotension, nephrology consult appreciated, patient was treated with bicarb drip  3)H/o HFrEF--patient appears to have developed acute on chronic combined systolic/diastolic CHF exacerbation, she has history of chronic combined diastolic and systolic dysfunction CHF, echo from 02/24/2017 shows improved EF of 45-50%, patient is Entresto, Aldactone and Lasix have been on hold due to kidney concerns, IV Lasix as ordered, daily with fluid input and input monitoring, get cardiology input with regards to when Delene Loll can be restarted, continue isosorbide/hydralazine (Bidil) combo, continue Coreg  4)Chronic atrial fibrillation rate controlled -continue amiodarone and Coreg for rate control, okay to restart Eliquis  5)Hypothyroidism----  resume Synthroid.  6)Obstructive sleep apnea on CPAP at night continue the same.  7)History of hepatitis C status post Harwani.  Outpatient follow-up with PCP.[name]  8)Diabetes mellitus-last A1c 6.8 reflecting good control, Use Novolog/Humalog Sliding scale insulin with Accu-Cheks/Fingersticks as ordered   9) mild  normocytic anemia-may be hemodilution, baseline hemoglobin usually around 11 hemoglobin currently above 10, no evidence of bleeding watch closely  10)Left upper extremity swelling with some bruising over the ante cubital fossa: -Venous Dopplers without DVT   11)Disposition-patient and daughter declines skilled nursing facility rehab, she would  go home with home health   DVT prophylaxis: eLIQUIS   Code Status: Full code Family Communication: daughter at bedside Disposition Plan: Possibly home once sbo and diverticulitis resolved.   Consultants:   Surgery.    Nephrology  Procedures: None  Antimicrobials: zosyn since 3/28.      DVT Prophylaxis  :  Eliquis  Lab Results  Component Value Date   PLT 184 08/07/2017    Inpatient Medications  Scheduled Meds: . amiodarone  200 mg Oral Daily  . carvedilol  12.5 mg Oral BID WC  . enoxaparin (LOVENOX) injection  70 mg Subcutaneous Q24H  . feeding supplement  1 Container Oral TID BM  . furosemide  20 mg Intravenous Daily  . furosemide  40 mg Intravenous Once  . insulin aspart  0-5 Units Subcutaneous QHS  . insulin aspart  0-9 Units Subcutaneous TID WC  . isosorbide-hydrALAZINE  1 tablet Oral BID  . lactobacillus  1 g Oral TID WC  . levothyroxine  125 mcg Oral QAC breakfast  . metoCLOPramide (REGLAN) injection  5 mg Intravenous Q8H  . nitroGLYCERIN  1 inch Topical Once  . pantoprazole (PROTONIX) IV  40 mg Intravenous Q24H  . potassium chloride  40 mEq Oral Once  . sertraline  50 mg Oral Daily   Continuous Infusions: . piperacillin-tazobactam (ZOSYN)  IV Stopped (08/09/17 1600)   PRN Meds:.acetaminophen **OR** acetaminophen, albuterol, hydrALAZINE, iopamidol, menthol-cetylpyridinium, morphine injection, ondansetron (ZOFRAN) IV, oxyCODONE, phenol, promethazine, traMADol, traZODone    Anti-infectives (From admission, onward)   Start     Dose/Rate Route Frequency Ordered Stop   08/07/17 2000  piperacillin-tazobactam  (ZOSYN) IVPB 3.375 g     3.375 g 12.5 mL/hr over 240 Minutes Intravenous Every 8 hours 08/07/17 1227     08/02/17 1800  piperacillin-tazobactam (ZOSYN) IVPB 2.25 g  Status:  Discontinued     2.25 g 100 mL/hr over 30 Minutes Intravenous Every 6 hours 08/02/17 1737 08/07/17 1226   07/31/17 2200  Ampicillin-Sulbactam (UNASYN) 3 g in sodium chloride 0.9 % 100 mL IVPB  Status:  Discontinued     3 g 200 mL/hr over 30 Minutes Intravenous Every 12 hours 07/31/17 2132 08/02/17 1708   07/31/17 1830  cefTRIAXone (ROCEPHIN) 2 g in sodium chloride 0.9 % 100 mL IVPB     2 g 200 mL/hr over 30 Minutes Intravenous  Once 07/31/17 1822 07/31/17 1919   07/31/17 1830  metroNIDAZOLE (FLAGYL) IVPB 500 mg  Status:  Discontinued     500 mg 100 mL/hr over 60 Minutes Intravenous  Once 07/31/17 1822 07/31/17 2053   07/31/17 1830  doxycycline (VIBRAMYCIN) 100 mg in sodium chloride 0.9 % 250 mL IVPB  Status:  Discontinued     100 mg 125 mL/hr over 120 Minutes Intravenous  Once 07/31/17 1822 07/31/17 2053        Objective:   Vitals:   08/09/17 0450 08/09/17 0515 08/09/17 0830 08/09/17  1548  BP:  (!) 143/73 (!) 157/72 (!) 171/74  Pulse:  72 74 73  Resp:  18 20 18   Temp:  97.7 F (36.5 C) 97.6 F (36.4 C) 97.8 F (36.6 C)  TempSrc:  Axillary Axillary Oral  SpO2:  100% 100% 100%  Weight: 79.4 kg (175 lb 0.7 oz)     Height:        Wt Readings from Last 3 Encounters:  08/09/17 79.4 kg (175 lb 0.7 oz)  07/19/17 68.5 kg (151 lb)  06/26/17 68 kg (150 lb)     Intake/Output Summary (Last 24 hours) at 08/09/2017 1819 Last data filed at 08/09/2017 1611 Gross per 24 hour  Intake 2840 ml  Output 203 ml  Net 2637 ml     Physical Exam  Gen:- Awake Alert,  In no apparent distress  HEENT:- [place].AT, No sclera icterus Neck-Supple Neck,No JVD,.  Lungs-diminished in bases, faint bibasilar rales CV- S1, S2 normal, irregularly irregular, AICD in situ Abd-  +ve B.Sounds, Abd Soft, No tenderness,      Extremity/Skin:-Good pulses skin is warm and dry  psych-affect is appropriate, oriented x3 Neuro-generalized weakness without any new focal deficits, no tremors   Data Review:   Micro Results Recent Results (from the past 240 hour(s))  C difficile quick scan w PCR reflex     Status: None   Collection Time: 08/06/17 10:16 AM  Result Value Ref Range Status   C Diff antigen NEGATIVE NEGATIVE Final   C Diff toxin NEGATIVE NEGATIVE Final   C Diff interpretation No C. difficile detected.  Final    Comment: Performed at [facility], [place] [address], [place], [place] [address]    Radiology Reports Ct Abdomen Pelvis Wo Contrast  Result Date: 08/03/2017 CLINICAL DATA:  Lower abdominal pain. EXAM: CT ABDOMEN AND PELVIS WITHOUT CONTRAST TECHNIQUE: Multidetector CT imaging of the abdomen and pelvis was performed following the standard protocol without IV contrast. COMPARISON:  CT scan of July 31, 2017. FINDINGS: Lower chest: Mild bibasilar subsegmental atelectasis or pneumonia is noted. Hepatobiliary: No gallstones are noted. No focal hepatic lesion is noted. Mildly nodular hepatic margins are noted suggesting cirrhosis. Pancreas: Unremarkable. No pancreatic ductal dilatation or surrounding inflammatory changes. Spleen: Normal in size without focal abnormality. Adrenals/Urinary Tract: Adrenal glands are unremarkable. Kidneys are normal, without renal calculi, focal lesion, or hydronephrosis. Bladder is unremarkable. Stomach/Bowel: The stomach appears normal. Moderate small bowel dilatation is noted concerning for ileus or distal small bowel obstruction. Proximal sigmoid diverticulitis is again noted. Vascular/Lymphatic: Aortic atherosclerosis. No enlarged abdominal or pelvic lymph nodes. Reproductive: Large calcified uterine fibroid is noted. No adnexal abnormality is noted. Other: No abnormal fluid collection is noted. Mild fluid-filled left inguinal hernia is noted. Musculoskeletal: No  acute or significant osseous findings. IMPRESSION: Interval development of moderate small bowel dilatation is noted concerning for ileus or distal small bowel obstruction. Proximal sigmoid diverticulitis is again noted. Mildly nodular hepatic margins are noted suggesting hepatic cirrhosis. Large calcified uterine fibroid is noted. Mild bibasilar subsegmental atelectasis or pneumonia is noted. Aortic Atherosclerosis (ICD10-I70.0). Electronically Signed   By: Marijo Conception, M.D.   On: 08/03/2017 17:16   Ct Abdomen Pelvis Wo Contrast  Result Date: 07/31/2017 CLINICAL DATA:  Abdominal pain, distention, nausea, and vomiting. EXAM: CT ABDOMEN AND PELVIS WITHOUT CONTRAST TECHNIQUE: Multidetector CT imaging of the abdomen and pelvis was performed following the standard protocol without IV contrast. COMPARISON:  Right upper quadrant abdominal ultrasound 02/23/2014. Renal ultrasound 10/01/2013. Abdominal  ultrasound 04/06/2011. FINDINGS: Lower chest: Patchy consolidation in both lower lobes, incompletely imaged. No pleural effusion. Partially visualized ICD leads and cardiomegaly. Hepatobiliary: Slightly nodular liver contour without focal abnormality identified on this unenhanced study. The layering hyperdense material in the gallbladder lumen without gross wall thickening or pericholecystic inflammation. No biliary dilatation. Pancreas: Unremarkable. Spleen: Unremarkable. Adrenals/Urinary Tract: Unremarkable adrenal glands. No evidence of renal mass, calculi, or hydronephrosis. Mildly distended urinary bladder without evidence of wall thickening. Stomach/Bowel: The stomach is within normal limits. Scattered diverticula are noted in the distal descending and sigmoid colon, and there is the suggestion of mild wall thickening of the proximal sigmoid colon with mild surrounding fat stranding. Scattered air-fluid levels are present in multiple small bowel loops without bowel dilatation suggestive of a significant mechanical  obstruction. There is a question of mild wall thickening involving a few small bowel loops in the left mid to lower abdomen. Prior appendectomy. Vascular/Lymphatic: Abdominal aortic atherosclerosis without aneurysm. No enlarged lymph nodes. Reproductive: Multiple calcified uterine fibroids including a 6 cm anterior fibroid resulting in impression on the bladder. No adnexal mass. Other: No intraperitoneal free fluid. Mild nonspecific haziness throughout the mesenteric fat. No abdominal wall hernia. Musculoskeletal: Lower lumbar disc bulging and moderate facet arthrosis. IMPRESSION: 1. Suspected mild acute diverticulitis involving the proximal sigmoid colon. 2. Scattered small bowel air-fluid levels and with questionable wall thickening of a few left-sided small bowel loops, query enteritis. 3. Bilateral lower lobe lung opacities compatible with pneumonia. 4. Slightly nodular contour of the liver which could reflect mild changes of cirrhosis. 5. Layering density in the gallbladder lumen may represent concentrated bile and/or small stones. 6. Fibroid uterus. Electronically Signed   By: Logan Bores M.D.   On: 07/31/2017 18:17   Dg Chest 2 View  Result Date: 08/08/2017 CLINICAL DATA:  72 year old female with shortness of breath and left upper extremity swelling. EXAM: CHEST - 2 VIEW COMPARISON:  07/31/2017 and earlier. FINDINGS: Bilateral basilar predominant increased interstitial and patchy indistinct perihilar opacity. Small bilateral pleural effusions are suspected. Lung volumes are lower than in November 2018. No pneumothorax. Stable cardiomegaly and mediastinal contours. Stable left chest cardiac AICD. No acute osseous abnormality identified. Negative visible bowel gas pattern. IMPRESSION: Acute perihilar pulmonary edema suspected with small bilateral pleural effusions. Acute viral/atypical respiratory infection felt less likely. Electronically Signed   By: Genevie Ann M.D.   On: 08/08/2017 13:23   Dg Chest 2  View  Result Date: 07/31/2017 CLINICAL DATA:  Chest pain short of breath EXAM: CHEST - 2 VIEW COMPARISON:  03/22/2017 FINDINGS: Hypoventilation with bibasilar atelectasis/infiltrate. Negative for heart failure or effusion. AICD unchanged in position IMPRESSION: Hypoventilation with bibasilar atelectasis/infiltrate. Electronically Signed   By: Franchot Gallo M.D.   On: 07/31/2017 13:04   US Renal  Result Date: 08/03/2017 CLINICAL DATA:  Acute renal failure, history hypertension, hyperlipidemia, CHF, non ischemic cardiomyopathy EXAM: RENAL / URINARY TRACT ULTRASOUND COMPLETE COMPARISON:  CT abdomen and pelvis 07/31/2017 FINDINGS: Right Kidney: Length: 9.5 cm. Normal cortical thickness. Increased cortical echogenicity. No mass, hydronephrosis, or or shadowing calcification. Left Kidney: Length: 10.6 cm. Normal cortical thickness. Increased cortical echogenicity. No mass, hydronephrosis, or or shadowing calcification. Bladder: Appears normal for degree of bladder distention. BILATERAL ureteral jets noted. IMPRESSION: Medical renal disease changes of the kidneys. Electronically Signed   By: Lavonia [name] M.D.   On: 08/03/2017 15:58   Dg Abd 2 Views  Result Date: 08/06/2017 CLINICAL DATA:  Small-bowel obstruction. History of an appendectomy. EXAM: ABDOMEN -  2 VIEW COMPARISON:  CT, 08/03/2017 FINDINGS: There are dilated loops of small bowel with no colonic distention, similar to the prior CT. Air-fluid levels are noted on the decubitus view. No free air. Calcified fibroid is noted in the central pelvis. Surgical clips the right lower quadrant reflect prior appendectomy. IMPRESSION: 1. Persistent partial small bowel obstruction. No significant change from the prior CT scan. No free air. Electronically Signed   By: Lajean Manes M.D.   On: 08/06/2017 09:22   Dg Abd Acute W/chest  Result Date: 08/09/2017 CLINICAL DATA:  Severe abdominal pain.  Abdominal distention. EXAM: DG ABDOMEN ACUTE W/ 1V CHEST COMPARISON:   Chest x-ray 08/08/2017. Abdomen series 08/06/2017. CT 08/03/2017. FINDINGS: AICD in stable position. Stable cardiomegaly with diffuse bilateral pulmonary infiltrates most consistent pulmonary edema. Bilateral pneumonia cannot be excluded. Small bilateral pleural effusions. No interim change. Persistent prominent distended loops of small bowel are noted. Relative paucity of intra colonic gas is present. Findings consistent with small-bowel obstruction. Small-bowel diameter up to 4.6 cm. No free air. Pelvic calcifications again noted consistent calcified fibroids as previously noted on prior CT. Degenerative changes lumbar spine and both hips. Surgical clips right quadrant. IMPRESSION: 1. AICD in stable position. Persistent changes of congestive heart failure bilateral pulmonary edema small pleural effusions. 2. Persistent changes of small-bowel obstruction. Small-bowel dilatation now measures up to 4.6 cm. Electronically Signed   By: Marcello Moores  Register   On: 08/09/2017 11:03   Dg Foot Complete Left  Result Date: 07/12/2017 Please see detailed radiograph report in office note.  [name] Soft Tissue Non Vascular  Result Date: 08/08/2017 CLINICAL DATA:  Left antecubital fossa swelling after blood draw. EXAM: ULTRASOUND LEFT UPPER EXTREMITY LIMITED TECHNIQUE: Ultrasound examination of the upper extremity soft tissues was performed in the area of clinical concern. COMPARISON:  None. FINDINGS: There is subcutaneous edema in the area of clinical concern along the left antecubital fossa and proximal volar forearm. No discrete fluid collection. IMPRESSION: Subcutaneous edema in the left antecubital fossa and proximal volar forearm. No fluid collection. Electronically Signed   By: Titus Dubin M.D.   On: 08/08/2017 18:54     CBC Recent Labs  Lab 08/03/17 0323 08/05/17 0339 08/07/17 0411  WBC 9.8 5.9 8.2  HGB 11.2* 11.1* 10.6*  HCT 34.4* 34.8* 32.9*  PLT 179 184 184  MCV 89.1 89.5 88.9  MCH  29.0 28.5 28.6  MCHC 32.6 31.9 32.2  RDW 15.6* 15.5 15.8*   Chemistries  Recent Labs  Lab 08/04/17 0844 08/05/17 0339 08/06/17 0404 08/07/17 0411 08/08/17 0350 08/09/17 0339  NA 140 135  --  142 141 138  K 4.6 4.0  --  4.0 3.6 3.2*  CL 112* 107  --  116* 109 103  CO2 17* 14*  --  14* 15* 21*  GLUCOSE 90 81  --  92 103* 118*  BUN 73* 68*  --  43* 41* 39*  CREATININE 3.77* 3.34* 2.42* 2.15* 2.21* 2.11*  CALCIUM 8.3* 8.4*  --  8.6* 8.8* 8.6*   ------------------------------------------------------------------------------------------------------------------ No results for input(s): CHOL, HDL, LDLCALC, TRIG, CHOLHDL, LDLDIRECT in the last 72 hours.  Lab Results  Component Value Date   HGBA1C 6.8 (H) 06/07/2017   ------------------------------------------------------------------------------------------------------------------ No results for input(s): TSH, T4TOTAL, T3FREE, THYROIDAB in the last 72 hours.  Invalid input(s): FREET3 ------------------------------------------------------------------------------------------------------------------ No results for input(s): VITAMINB12, FOLATE, FERRITIN, TIBC, IRON, RETICCTPCT in the last 72 hours.  Coagulation profile No results for input(s): INR, PROTIME in  the last 168 hours.  No results for input(s): DDIMER in the last 72 hours.  Cardiac Enzymes No results for input(s): CKMB, TROPONINI, MYOGLOBIN in the last 168 hours.  Invalid input(s): CK ------------------------------------------------------------------------------------------------------------------    Component Value Date/Time   BNP 547.9 (H) 07/31/2017 1229   Roxan Hockey M.D on 08/09/2017 at 6:19 PM  Between 7am to 7pm - Pager - [phone]  After 7pm go to [email or web address] - password [email or web address]  [facility] -  Office  [phone]  Voice Recognition Viviann Spare dictation system was used to create this note, attempts have been made to correct errors. Please contact  the author with questions and/or clarifications.

## 2017-08-09 NOTE — Progress Notes (Signed)
Responded to rapid response called in the room 1344  Patient with chest pains, shortness of breath, increased work of breathing and elevated blood pressure,   Upon arrival patient is tachypneic, increased work of breathing, she is holding onto the bed rails and working hard to breathe..   Complains of chest heaviness/pressure.   Physical Exam  Gen:- Awake Alert, anxious with some respiratory distress HEENT:- [place].AT, No sclera icterus Nose- [place] 3 L/min Neck-Supple Neck,No JVD,.  Lungs-breath sounds with faint bibasilar rales  CV- S1, S2 normal, AICD in situ,  Abd-  +ve B.Sounds, Abd Soft, No tenderness,    Extremity/Skin:-Negative Homans , warm and dry skin  psych-affect is anxious, oriented x3 Neuro-no new focal deficits, no tremors  Labs/imaging EKG with paced rhythm  Troponin, CBC and CMP are pending  Chest and abdominal x-rays from earlier in the day with pulmonary edema and pleural effusions  Give IV Lasix 40 mg every 12 hours first dose now, topical Nitropaste.  Patient has been on full anticoagulation since admission (prior to admission she was on Eliquis, and then switched to therapeutic doses of Lovenox, and now being switched back to Eliquis).  A/p 1)CP-EKG as noted above, check serial troponins transferred to stepdown unit for close monitoring, patient is already on full anticoagulation, continue Coreg  2) acute pulmonary edema-last known EF 45-50%, Entresto currently on hold, Aldactone currently on hold, due to kidney concerns continue BiDil, Give IV Lasix 40 mg every 12 hours first dose now, topical Nitropaste, transferred to stepdown unit, okay to use BiPAP for increased work of breathing give supplemental oxygen.  Accurate input and output monitoring and daily weights  3)Social/Ethics-patient is a full code, with patient's permission I called and updated her daughter [name] by phone  Please see full progress note from earlier today   CRITICAL CARE Performed by:  Roxan Hockey  Responded to rapid response called in the room #1344  Patient with chest pains, shortness of breath, increased work of breathing and elevated blood pressure,   Upon arrival patient is tachypneic, increased work of breathing, she is holding onto the bed rails and working hard to breathe..   Complains of chest heaviness/pressure.  Total critical care time: 34 minutes  Critical care time was exclusive of separately billable procedures and treating other patients.  Critical care was necessary to treat or prevent imminent or life-threatening deterioration.  Critical care was time spent personally by me on the following activities: development of treatment plan with patient and/or surrogate as well as nursing, discussions with consultants, evaluation of patient's response to treatment, examination of patient, obtaining history from patient or surrogate, ordering and performing treatments and interventions, ordering and review of laboratory studies, ordering and review of radiographic studies, pulse oximetry and re-evaluation of patient's condition.

## 2017-08-09 NOTE — Progress Notes (Addendum)
Pt is sitting up in the chair. She C/O of  Shortness of breath and her chest feeling heavy. BP 176/69 HE 72 , R 24  Temp 97.7Dr Courage texted

## 2017-08-09 NOTE — Progress Notes (Signed)
Subjective/Chief Complaint: Feels better today Denies abdominal pain or nausea   Objective: Vital signs in last 24 hours: Temp:  [97.6 F (36.4 C)-97.8 F (36.6 C)] 97.6 F (36.4 C) (04/04 0830) Pulse Rate:  [72] 72 (04/04 0515) Resp:  [18-20] 20 (04/04 0830) BP: (143-210)/(63-83) 143/73 (04/04 0515) SpO2:  [95 %-100 %] 100 % (04/04 0830) Weight:  [79.4 kg (175 lb 0.7 oz)] 79.4 kg (175 lb 0.7 oz) (04/04 0450) Last BM Date: 08/08/17  Intake/Output from previous day: 04/03 0701 - 04/04 0700 In: 2950 [P.O.:250; I.V.:2500; IV Piggyback:200] Out: -  Intake/Output this shift: No intake/output data recorded.  Exam: Looks comfortable Abdomen soft, non-tender  Lab Results:  Recent Labs    08/07/17 0411  WBC 8.2  HGB 10.6*  HCT 32.9*  PLT 184   BMET Recent Labs    08/08/17 0350 08/09/17 0339  NA 141 138  K 3.6 3.2*  CL 109 103  CO2 15* 21*  GLUCOSE 103* 118*  BUN 41* 39*  CREATININE 2.21* 2.11*  CALCIUM 8.8* 8.6*   PT/INR No results for input(s): LABPROT, INR in the last 72 hours. ABG No results for input(s): PHART, HCO3 in the last 72 hours.  Invalid input(s): PCO2, PO2  Studies/Results: Dg Chest 2 View  Result Date: 08/08/2017 CLINICAL DATA:  72 year old female with shortness of breath and left upper extremity swelling. EXAM: CHEST - 2 VIEW COMPARISON:  07/31/2017 and earlier. FINDINGS: Bilateral basilar predominant increased interstitial and patchy indistinct perihilar opacity. Small bilateral pleural effusions are suspected. Lung volumes are lower than in November 2018. No pneumothorax. Stable cardiomegaly and mediastinal contours. Stable left chest cardiac AICD. No acute osseous abnormality identified. Negative visible bowel gas pattern. IMPRESSION: Acute perihilar pulmonary edema suspected with small bilateral pleural effusions. Acute viral/atypical respiratory infection felt less likely. Electronically Signed   By: Genevie Ann M.D.   On: 08/08/2017 13:23    [name] Lt Upper Extrem Ltd Soft Tissue Non Vascular  Result Date: 08/08/2017 CLINICAL DATA:  Left antecubital fossa swelling after blood draw. EXAM: ULTRASOUND LEFT UPPER EXTREMITY LIMITED TECHNIQUE: Ultrasound examination of the upper extremity soft tissues was performed in the area of clinical concern. COMPARISON:  None. FINDINGS: There is subcutaneous edema in the area of clinical concern along the left antecubital fossa and proximal volar forearm. No discrete fluid collection. IMPRESSION: Subcutaneous edema in the left antecubital fossa and proximal volar forearm. No fluid collection. Electronically Signed   By: Titus Dubin M.D.   On: 08/08/2017 18:54    Anti-infectives: Anti-infectives (From admission, onward)   Start     Dose/Rate Route Frequency Ordered Stop   08/07/17 2000  piperacillin-tazobactam (ZOSYN) IVPB 3.375 g     3.375 g 12.5 mL/hr over 240 Minutes Intravenous Every 8 hours 08/07/17 1227     08/02/17 1800  piperacillin-tazobactam (ZOSYN) IVPB 2.25 g  Status:  Discontinued     2.25 g 100 mL/hr over 30 Minutes Intravenous Every 6 hours 08/02/17 1737 08/07/17 1226   07/31/17 2200  Ampicillin-Sulbactam (UNASYN) 3 g in sodium chloride 0.9 % 100 mL IVPB  Status:  Discontinued     3 g 200 mL/hr over 30 Minutes Intravenous Every 12 hours 07/31/17 2132 08/02/17 1708   07/31/17 1830  cefTRIAXone (ROCEPHIN) 2 g in sodium chloride 0.9 % 100 mL IVPB     2 g 200 mL/hr over 30 Minutes Intravenous  Once 07/31/17 1822 07/31/17 1919   07/31/17 1830  metroNIDAZOLE (FLAGYL) IVPB 500 mg  Status:  Discontinued     500 mg 100 mL/hr over 60 Minutes Intravenous  Once 07/31/17 1822 07/31/17 2053   07/31/17 1830  doxycycline (VIBRAMYCIN) 100 mg in sodium chloride 0.9 % 250 mL IVPB  Status:  Discontinued     100 mg 125 mL/hr over 120 Minutes Intravenous  Once 07/31/17 1822 07/31/17 2053      Assessment/Plan: s/p * No surgery found * Colitis vs mild diverticulitis  Will advance diet. I think  antibiotics can be stopped Will sign off for now as no plans for surgery Would have her follow up with GI as an outpt.  No Gen Surg follow up needed.   LOS: 9 days    Rosalene Wardrop A 08/09/2017

## 2017-08-09 NOTE — Progress Notes (Signed)
Pt was lying in bed alert and awake when I arrived. When asked if there was anything I could do, she asked for prayer. Pt was appreciative of support and prayer. Please page if additional support is needed. [place], [place]   08/09/17 2000  Clinical Encounter Type  Visited With Patient

## 2017-08-09 NOTE — Progress Notes (Signed)
ANTICOAGULATION CONSULT NOTE - Follow-Up Consult  Pharmacy Consult for changing Lovenox to Apixaban Indication: atrial fibrillation  No Known Allergies  Patient Measurements: Height: 4\' 11"  (149.9 cm) Weight: 175 lb 0.7 oz (79.4 kg) IBW/kg (Calculated) : 43.2  Vital Signs: Temp: 97.8 F (36.6 C) (04/04 1548) Temp Source: Oral (04/04 1548) BP: 171/74 (04/04 1548) Pulse Rate: 73 (04/04 1548)  Labs: Recent Labs    08/07/17 0411 08/08/17 0350 08/09/17 0339  HGB 10.6*  --   --   HCT 32.9*  --   --   PLT 184  --   --   CREATININE 2.15* 2.21* 2.11*   Estimated Creatinine Clearance: 22.9 mL/min (A) (by C-G formula based on SCr of 2.11 mg/dL (H)).  Medical History: Past Medical History:  Diagnosis Date  . Arthritis   . Asthma    reports mild asthma since childhood - had COPD on dx list from prior PCP  . Atypical atrial flutter ([place]) 09/28/2015  . Chronic systolic congestive heart failure ([place])   . DEPRESSION 12/17/2009   Annotation: PHQ-9 score = 14 done on 12/17/2009 Qualifier: Diagnosis of  By: Hassell Done FNP, Tori Milks    . FIBROIDS, UTERUS 03/05/2008  . Glaucoma   . HEPATITIS C - s/p treatment with Harvoni, seeing hepatology, Dawn Drazek 01/04/2007  . Hyperlipemia 12/06/2012  . HYPERTENSION, BENIGN 04/24/2007  . Hypothyroidism   . LBBB (left bundle branch block)   . Nonischemic cardiomyopathy ([place])   . OBESITY 05/27/2009  . OBSTRUCTIVE SLEEP APNEA 11/14/2007   npsg 2009:  Mild osa with AHI 11/hr. Started cpap 2009, quit using due to lack of response Home sleep testing 09/2010 with AHI only 7/hr and weight loss advised by pulmonology.  . OSTEOPENIA 09/30/2008  . Personal history of other infectious and parasitic disease    Hepatitis B   Medications:  Scheduled:  . amiodarone  200 mg Oral Daily  . apixaban  5 mg Oral BID  . carvedilol  12.5 mg Oral BID WC  . feeding supplement  1 Container Oral TID BM  . furosemide  20 mg Intravenous Daily  . furosemide  40 mg Intravenous  Once  . insulin aspart  0-5 Units Subcutaneous QHS  . insulin aspart  0-9 Units Subcutaneous TID WC  . isosorbide-hydrALAZINE  1 tablet Oral BID  . lactobacillus  1 g Oral TID WC  . levothyroxine  125 mcg Oral QAC breakfast  . metoCLOPramide (REGLAN) injection  5 mg Intravenous Q8H  . nitroGLYCERIN  1 inch Topical Once  . pantoprazole (PROTONIX) IV  40 mg Intravenous Q24H  . potassium chloride  40 mEq Oral Once  . sertraline  50 mg Oral Daily   Infusions:  . piperacillin-tazobactam (ZOSYN)  IV Stopped (08/09/17 1600)   Assessment:71 yoF to ED 3/26 with abd pain. Medical history significantfor NICM (last EF 45%) s/pCRT-D, A. fib on Eliquis with recent DCCV on 3/19, OSA on CPAP, HCV status post Harvoni (viremic response not documented), hypothyroidism, depression and diet managed type 2 diabetes mellitus.  AKI on admission, suspected diverticulitis vs enteritis, CXray: prob PNA  Today, 08/09/17  To change Lovenox back to apixaban this PM  Scr still elevated > 2  No reported bleeding on Lovenox  CBC stable on 4/2, will recheck tomorrow 4/5  Goal of Therapy:  Anti-Xa level 0.6-1 units/ml 4hrs after LMWH dose given, if level deemed necessary Monitor platelets by anticoagulation protocol: Yes   Plan:  1) Last Lovenox 70mg  SQ q24 given last PM  at 2145 - discontinued now 2) Start apixaban 5mg  PO BID beginning at 2200 this PM 3) Monitor renal function, CBC and for bleeding   Adrian Saran, PharmD, BCPS Pager [phone] 08/09/2017 6:53 PM

## 2017-08-09 NOTE — Progress Notes (Addendum)
Rapid Response Event Note  Overview:  RRT called to patient room due to patient having chest pain unrelenting to Nitro paste, pt having elevated BP, and some respiratory distress.    Initial Focused Assessment: Dr. Denton Brick at bedside. Patient in bed stating she is having pain in her chest, appears uncomfortable. Breathing appears labored, oxygen saturation 100% on 2LNC, respiratory rate 14. Lung sounds clear, diminished; not a lot of air movement heard. Pt is V-paced. SBP 180-200s, manually verified. HR 70s-80s.   Interventions: IV hydralazine given by bedside RN.  Nitro paste already placed on patient.  BP remains elevated and chest pain continued, decision made with MD to transfer to SDU.  Plan of Care (if not transferred):  Event Summary:  Morgan Clay

## 2017-08-10 LAB — GLUCOSE, CAPILLARY
GLUCOSE-CAPILLARY: 110 mg/dL — AB (ref 65–99)
GLUCOSE-CAPILLARY: 129 mg/dL — AB (ref 65–99)
GLUCOSE-CAPILLARY: 109 mg/dL — AB (ref 65–99)
GLUCOSE-CAPILLARY: 150 mg/dL — AB (ref 65–99)

## 2017-08-10 LAB — TROPONIN I
Troponin I: 0.08 ng/mL (ref <0.03)
Troponin I: 0.07 ng/mL (ref <0.03)

## 2017-08-10 LAB — COMPREHENSIVE METABOLIC PANEL
ALK PHOS: 36 U/L — AB (ref 38–126)
ALT: 11 U/L — AB (ref 14–54)
AST: 14 U/L — ABNORMAL LOW (ref 15–41)
Albumin: 3.1 g/dL — ABNORMAL LOW (ref 3.5–5.0)
Anion gap: 14 (ref 5–15)
BILIRUBIN TOTAL: 0.7 mg/dL (ref 0.3–1.2)
BUN: 36 mg/dL — ABNORMAL HIGH (ref 6–20)
CALCIUM: 8.2 mg/dL — AB (ref 8.9–10.3)
CO2: 23 mmol/L (ref 22–32)
CREATININE: 1.97 mg/dL — AB (ref 0.44–1.00)
Chloride: 100 mmol/L — ABNORMAL LOW (ref 101–111)
GFR, EST AFRICAN AMERICAN: 29 mL/min — AB (ref >60)
GFR, EST NON AFRICAN AMERICAN: 25 mL/min — AB (ref >60)
Glucose, Bld: 126 mg/dL — ABNORMAL HIGH (ref 65–99)
Potassium: 3.1 mmol/L — ABNORMAL LOW (ref 3.5–5.1)
Sodium: 137 mmol/L (ref 135–145)
TOTAL PROTEIN: 7.2 g/dL (ref 6.5–8.1)

## 2017-08-10 LAB — CBC
HEMATOCRIT: 29.7 % — AB (ref 36.0–46.0)
HEMOGLOBIN: 9.9 g/dL — AB (ref 12.0–15.0)
MCH: 28.7 pg (ref 26.0–34.0)
MCHC: 33.3 g/dL (ref 30.0–36.0)
MCV: 86.1 fL (ref 78.0–100.0)
Platelets: 198 10*3/uL (ref 150–400)
RBC: 3.45 MIL/uL — AB (ref 3.87–5.11)
RDW: 15.4 % (ref 11.5–15.5)
WBC: 7.5 10*3/uL (ref 4.0–10.5)

## 2017-08-10 LAB — MRSA PCR SCREENING: MRSA by PCR: NEGATIVE

## 2017-08-10 LAB — MAGNESIUM: MAGNESIUM: 1.8 mg/dL (ref 1.7–2.4)

## 2017-08-10 MED ORDER — POTASSIUM CHLORIDE CRYS ER 20 MEQ PO TBCR: 40.0000 meq | EXTENDED_RELEASE_TABLET | Freq: Once | ORAL | Status: DC

## 2017-08-10 MED ORDER — POTASSIUM CHLORIDE CRYS ER 20 MEQ PO TBCR
40.0000 meq | EXTENDED_RELEASE_TABLET | Freq: Four times a day (QID) | ORAL | Status: AC
  Administered 2017-08-10 (×2): 40 meq via ORAL
  Filled 2017-08-10 (×2): qty 2

## 2017-08-10 MED ORDER — POTASSIUM CHLORIDE CRYS ER 20 MEQ PO TBCR
40.0000 meq | EXTENDED_RELEASE_TABLET | Freq: Once | ORAL | Status: AC
  Administered 2017-08-10: 40 meq via ORAL
  Filled 2017-08-10: qty 2

## 2017-08-10 MED ORDER — FUROSEMIDE 10 MG/ML IJ SOLN
40.0000 mg | Freq: Once | INTRAMUSCULAR | Status: AC
  Administered 2017-08-10: 40 mg via INTRAVENOUS
  Filled 2017-08-10: qty 4

## 2017-08-10 NOTE — Progress Notes (Signed)
Patient Demographics:    Morgan Clay, is a 72 y.o. female, DOB - 09-09-1945, YQM:[id number]  Admit date - 07/31/2017   Admitting Physician Adam Sharene Butters, MD  Outpatient Primary MD for the patient is [name], DO  LOS - 10  Chief Complaint  Patient presents with  . Abdominal Pain  . Shortness of Breath        Subjective:    Morgan Clay today has no fevers, no emesis,  No further chest pain,  Sob is better, urine output is not great, had 3 BMs in last 12 hrs   Assessment  & Plan :    Principal Problem:   Pulmonary edema, acute (HCC) Active Problems:   Hypothyroidism   Obesity   OBSTRUCTIVE SLEEP APNEA   Type 2 diabetes, controlled, with renal manifestation (HCC)   Chronic kidney disease - followed by [facility]   Chronic obstructive pulmonary disease ([place])   Hypertension associated with diabetes ([place])   Persistent atrial fibrillation (HCC)   Diverticulitis   ARF (acute renal failure) (HCC)   Enteritis   Partial small bowel obstruction (HCC)   Pain and swelling of left upper extremity   Brief Narrative: Morgan Clay a 72 y.o.femalewith medical history significantfor NICM (last EF 45%) s/pCRT-D, A. fib on Eliquis with recent DCCV on 07/24/17, OSA on CPAP, HCV status post Harvoni (viremic response not documented), hypothyroidism, depression and diet managed type 2 diabetes mellitus who presents to the ED with 5 days of diffuse, generalized abdominal pain, with associated anorexia, nausea and one episode of profuse nonbloody, nonbilious emesis on the day of presentation.  CT abdomen and pelvis without contrast showed Suspected mild acute diverticulitis involving the proximal sigmoid colon. Scattered small bowel air-fluid levels and with questionable wall thickening of a few left-sided small bowel loops, query enteritis.  Admitted on 07/31/2017 with the above findings  with concerns about possible partial SBO versus diverticulitis versus colitis as well as concerns about possible pneumonia    Plan:- 1)Acute Sigmoid Diverticulitis Vs Colitis--tolerating GI soft diet well, having bowel movements, No further fevers,  She has completed 7 days of IV zosyn in addition to 2 days of Unasyn.    Surgical consult appreciated, surgery recommended discontinuation of antibiotics, surgical service signed off as of 08/09/2017.  Abdominal imaging studies continues to suggest possible small bowel obstruction, however clinically patient is tolerating oral intake and having bowel movements   2)AKI----acute kidney injury on CKD stage III -     creatinine on admission= 2.1 ,   baseline creatinine = 1.8   , creatinine is now= 1.9   , creatinine peaked at 3.77,  Avoid nephrotoxic agents/dehydration/hypotension, nephrology consult appreciated, patient was treated with bicarb drip  3)H/o HFrEF-- developed acute on chronic combined systolic/diastolic CHF exacerbation on 08/09/17, she has history of chronic combined diastolic and systolic dysfunction CHF, echo from 02/24/2017 shows improved EF of 45-50%, patient was on Entresto, Aldactone prior to admission, both are currently on hold  , c/n  IV Lasix for diuresis ordered, daily with fluid input and input monitoring, get cardiology input with regards to when [facility] can be restarted, continue isosorbide/hydralazine (Bidil) combo, continue Coreg  4)Chronic atrial fibrillation rate controlled -continue amiodarone and Coreg for rate control,  restarted Eliquis on 08/09/17  5)Hypothyroidism----  resume Synthroid.  6)Disposition /Generalized weakness and debility-dyspnea on exertion, hypoxia, therapist recommend skilled nursing facility for rehab patient and daughter strongly considering going home with home health  7)History of hepatitis C status post Harwani.  Outpatient follow-up with PCP.[name]  8)Diabetes mellitus-last A1c 6.8 reflecting  good control, Use Novolog/Humalog Sliding scale insulin with Accu-Cheks/Fingersticks as ordered   9) mild normocytic anemia-may be hemodilution, baseline hemoglobin usually around 11 hemoglobin currently above 10, no evidence of bleeding watch closely  10)Chest Pain-resolved, troponins noted, EKG with paced rhythm,   DVT prophylaxis: ELIQUIS   Code Status: Full code Family Communication: daughter at bedside Disposition Plan: Possibly home once sbo and diverticulitis resolved.   Consultants:   Surgery.    Nephrology  Procedures: None  Antimicrobials: zosyn since 3/28.    DVT Prophylaxis  :  Eliquis  Lab Results  Component Value Date   PLT 198 08/10/2017    Inpatient Medications  Scheduled Meds: . amiodarone  200 mg Oral Daily  . apixaban  5 mg Oral BID  . carvedilol  12.5 mg Oral BID WC  . feeding supplement  1 Container Oral TID BM  . furosemide  40 mg Intravenous Q12H  . insulin aspart  0-5 Units Subcutaneous QHS  . insulin aspart  0-9 Units Subcutaneous TID WC  . isosorbide-hydrALAZINE  1 tablet Oral BID  . lactobacillus  1 g Oral TID WC  . levothyroxine  125 mcg Oral QAC breakfast  . metoCLOPramide (REGLAN) injection  5 mg Intravenous Q8H  . pantoprazole (PROTONIX) IV  40 mg Intravenous Q24H  . potassium chloride  40 mEq Oral Once  . sertraline  50 mg Oral Daily   Continuous Infusions:  PRN Meds:.acetaminophen **OR** acetaminophen, albuterol, hydrALAZINE, iopamidol, menthol-cetylpyridinium, morphine injection, ondansetron (ZOFRAN) IV, oxyCODONE, phenol, promethazine, traMADol, traZODone    Anti-infectives (From admission, onward)   Start     Dose/Rate Route Frequency Ordered Stop   08/07/17 2000  piperacillin-tazobactam (ZOSYN) IVPB 3.375 g     3.375 g 12.5 mL/hr over 240 Minutes Intravenous Every 8 hours 08/07/17 1227 08/10/17 0250   08/02/17 1800  piperacillin-tazobactam (ZOSYN) IVPB 2.25 g  Status:  Discontinued     2.25 g 100 mL/hr over 30  Minutes Intravenous Every 6 hours 08/02/17 1737 08/07/17 1226   07/31/17 2200  Ampicillin-Sulbactam (UNASYN) 3 g in sodium chloride 0.9 % 100 mL IVPB  Status:  Discontinued     3 g 200 mL/hr over 30 Minutes Intravenous Every 12 hours 07/31/17 2132 08/02/17 1708   07/31/17 1830  cefTRIAXone (ROCEPHIN) 2 g in sodium chloride 0.9 % 100 mL IVPB     2 g 200 mL/hr over 30 Minutes Intravenous  Once 07/31/17 1822 07/31/17 1919   07/31/17 1830  metroNIDAZOLE (FLAGYL) IVPB 500 mg  Status:  Discontinued     500 mg 100 mL/hr over 60 Minutes Intravenous  Once 07/31/17 1822 07/31/17 2053   07/31/17 1830  doxycycline (VIBRAMYCIN) 100 mg in sodium chloride 0.9 % 250 mL IVPB  Status:  Discontinued     100 mg 125 mL/hr over 120 Minutes Intravenous  Once 07/31/17 1822 07/31/17 2053        Objective:   Vitals:   08/10/17 1225 08/10/17 1400 08/10/17 1600 08/10/17 1800  BP: (!) 135/56 (!) 153/66 140/78 (!) 167/70  Pulse: 64 63 63 64  Resp: 15 14 (!) 8 17  Temp: 97.8 F (36.6 C)  98.2 F (36.8 C)  TempSrc: Oral  Oral   SpO2: 99% 99% 100% 100%  Weight:      Height:        Wt Readings from Last 3 Encounters:  08/09/17 79.4 kg (175 lb 0.7 oz)  07/19/17 68.5 kg (151 lb)  06/26/17 68 kg (150 lb)     Intake/Output Summary (Last 24 hours) at 08/10/2017 1858 Last data filed at 08/10/2017 1800 Gross per 24 hour  Intake 100 ml  Output 710 ml  Net -610 ml     Physical Exam  Gen:- Awake Alert,  In no apparent distress  HEENT:- [place].AT, No sclera icterus Nose- [place] 3 L/min Neck-Supple Neck,No JVD,.  Lungs-diminished in bases, faint bibasilar rales, no wheezing CV- S1, S2 normal, irregularly irregular, AICD in situ Abd-  +ve B.Sounds, Abd Soft, No tenderness,    Extremity/Skin:-Good pulses,  skin is warm and dry  psych-affect is appropriate, oriented x3 Neuro-generalized weakness without any new focal deficits, no tremors   Data Review:   Micro Results Recent Results (from the past 240 hour(s))    C difficile quick scan w PCR reflex     Status: None   Collection Time: 08/06/17 10:16 AM  Result Value Ref Range Status   C Diff antigen NEGATIVE NEGATIVE Final   C Diff toxin NEGATIVE NEGATIVE Final   C Diff interpretation No C. difficile detected.  Final    Comment: Performed at [facility], [place] [address], [place], [place] [address]  MRSA PCR Screening     Status: None   Collection Time: 08/09/17  8:00 PM  Result Value Ref Range Status   MRSA by PCR NEGATIVE NEGATIVE Final    Comment:        The GeneXpert MRSA Assay (FDA approved for NASAL specimens only), is one component of a comprehensive MRSA colonization surveillance program. It is not intended to diagnose MRSA infection nor to guide or monitor treatment for MRSA infections. Performed at [facility], [place] [address] [place], [place] [address]     Radiology Reports Ct Abdomen Pelvis Wo Contrast  Result Date: 08/03/2017 CLINICAL DATA:  Lower abdominal pain. EXAM: CT ABDOMEN AND PELVIS WITHOUT CONTRAST TECHNIQUE: Multidetector CT imaging of the abdomen and pelvis was performed following the standard protocol without IV contrast. COMPARISON:  CT scan of July 31, 2017. FINDINGS: Lower chest: Mild bibasilar subsegmental atelectasis or pneumonia is noted. Hepatobiliary: No gallstones are noted. No focal hepatic lesion is noted. Mildly nodular hepatic margins are noted suggesting cirrhosis. Pancreas: Unremarkable. No pancreatic ductal dilatation or surrounding inflammatory changes. Spleen: Normal in size without focal abnormality. Adrenals/Urinary Tract: Adrenal glands are unremarkable. Kidneys are normal, without renal calculi, focal lesion, or hydronephrosis. Bladder is unremarkable. Stomach/Bowel: The stomach appears normal. Moderate small bowel dilatation is noted concerning for ileus or distal small bowel obstruction. Proximal sigmoid diverticulitis is again noted. Vascular/Lymphatic:  Aortic atherosclerosis. No enlarged abdominal or pelvic lymph nodes. Reproductive: Large calcified uterine fibroid is noted. No adnexal abnormality is noted. Other: No abnormal fluid collection is noted. Mild fluid-filled left inguinal hernia is noted. Musculoskeletal: No acute or significant osseous findings. IMPRESSION: Interval development of moderate small bowel dilatation is noted concerning for ileus or distal small bowel obstruction. Proximal sigmoid diverticulitis is again noted. Mildly nodular hepatic margins are noted suggesting hepatic cirrhosis. Large calcified uterine fibroid is noted. Mild bibasilar subsegmental atelectasis or pneumonia is noted. Aortic Atherosclerosis (ICD10-I70.0). Electronically Signed   By: Marijo Conception, M.D.   On: 08/03/2017  17:16   Ct Abdomen Pelvis Wo Contrast  Result Date: 07/31/2017 CLINICAL DATA:  Abdominal pain, distention, nausea, and vomiting. EXAM: CT ABDOMEN AND PELVIS WITHOUT CONTRAST TECHNIQUE: Multidetector CT imaging of the abdomen and pelvis was performed following the standard protocol without IV contrast. COMPARISON:  Right upper quadrant abdominal ultrasound 02/23/2014. Renal ultrasound 10/01/2013. Abdominal ultrasound 04/06/2011. FINDINGS: Lower chest: Patchy consolidation in both lower lobes, incompletely imaged. No pleural effusion. Partially visualized ICD leads and cardiomegaly. Hepatobiliary: Slightly nodular liver contour without focal abnormality identified on this unenhanced study. The layering hyperdense material in the gallbladder lumen without gross wall thickening or pericholecystic inflammation. No biliary dilatation. Pancreas: Unremarkable. Spleen: Unremarkable. Adrenals/Urinary Tract: Unremarkable adrenal glands. No evidence of renal mass, calculi, or hydronephrosis. Mildly distended urinary bladder without evidence of wall thickening. Stomach/Bowel: The stomach is within normal limits. Scattered diverticula are noted in the distal  descending and sigmoid colon, and there is the suggestion of mild wall thickening of the proximal sigmoid colon with mild surrounding fat stranding. Scattered air-fluid levels are present in multiple small bowel loops without bowel dilatation suggestive of a significant mechanical obstruction. There is a question of mild wall thickening involving a few small bowel loops in the left mid to lower abdomen. Prior appendectomy. Vascular/Lymphatic: Abdominal aortic atherosclerosis without aneurysm. No enlarged lymph nodes. Reproductive: Multiple calcified uterine fibroids including a 6 cm anterior fibroid resulting in impression on the bladder. No adnexal mass. Other: No intraperitoneal free fluid. Mild nonspecific haziness throughout the mesenteric fat. No abdominal wall hernia. Musculoskeletal: Lower lumbar disc bulging and moderate facet arthrosis. IMPRESSION: 1. Suspected mild acute diverticulitis involving the proximal sigmoid colon. 2. Scattered small bowel air-fluid levels and with questionable wall thickening of a few left-sided small bowel loops, query enteritis. 3. Bilateral lower lobe lung opacities compatible with pneumonia. 4. Slightly nodular contour of the liver which could reflect mild changes of cirrhosis. 5. Layering density in the gallbladder lumen may represent concentrated bile and/or small stones. 6. Fibroid uterus. Electronically Signed   By: Logan Bores M.D.   On: 07/31/2017 18:17   Dg Chest 2 View  Result Date: 08/08/2017 CLINICAL DATA:  72 year old female with shortness of breath and left upper extremity swelling. EXAM: CHEST - 2 VIEW COMPARISON:  07/31/2017 and earlier. FINDINGS: Bilateral basilar predominant increased interstitial and patchy indistinct perihilar opacity. Small bilateral pleural effusions are suspected. Lung volumes are lower than in November 2018. No pneumothorax. Stable cardiomegaly and mediastinal contours. Stable left chest cardiac AICD. No acute osseous abnormality  identified. Negative visible bowel gas pattern. IMPRESSION: Acute perihilar pulmonary edema suspected with small bilateral pleural effusions. Acute viral/atypical respiratory infection felt less likely. Electronically Signed   By: Genevie Ann M.D.   On: 08/08/2017 13:23   Dg Chest 2 View  Result Date: 07/31/2017 CLINICAL DATA:  Chest pain short of breath EXAM: CHEST - 2 VIEW COMPARISON:  03/22/2017 FINDINGS: Hypoventilation with bibasilar atelectasis/infiltrate. Negative for heart failure or effusion. AICD unchanged in position IMPRESSION: Hypoventilation with bibasilar atelectasis/infiltrate. Electronically Signed   By: Franchot Gallo M.D.   On: 07/31/2017 13:04   US Renal  Result Date: 08/03/2017 CLINICAL DATA:  Acute renal failure, history hypertension, hyperlipidemia, CHF, non ischemic cardiomyopathy EXAM: RENAL / URINARY TRACT ULTRASOUND COMPLETE COMPARISON:  CT abdomen and pelvis 07/31/2017 FINDINGS: Right Kidney: Length: 9.5 cm. Normal cortical thickness. Increased cortical echogenicity. No mass, hydronephrosis, or or shadowing calcification. Left Kidney: Length: 10.6 cm. Normal cortical thickness. Increased cortical echogenicity. No mass, hydronephrosis, or  or shadowing calcification. Bladder: Appears normal for degree of bladder distention. BILATERAL ureteral jets noted. IMPRESSION: Medical renal disease changes of the kidneys. Electronically Signed   By: Lavonia [name] M.D.   On: 08/03/2017 15:58   Dg Abd 2 Views  Result Date: 08/06/2017 CLINICAL DATA:  Small-bowel obstruction. History of an appendectomy. EXAM: ABDOMEN - 2 VIEW COMPARISON:  CT, 08/03/2017 FINDINGS: There are dilated loops of small bowel with no colonic distention, similar to the prior CT. Air-fluid levels are noted on the decubitus view. No free air. Calcified fibroid is noted in the central pelvis. Surgical clips the right lower quadrant reflect prior appendectomy. IMPRESSION: 1. Persistent partial small bowel obstruction. No  significant change from the prior CT scan. No free air. Electronically Signed   By: Lajean Manes M.D.   On: 08/06/2017 09:22   Dg Abd Acute W/chest  Result Date: 08/09/2017 CLINICAL DATA:  Severe abdominal pain.  Abdominal distention. EXAM: DG ABDOMEN ACUTE W/ 1V CHEST COMPARISON:  Chest x-ray 08/08/2017. Abdomen series 08/06/2017. CT 08/03/2017. FINDINGS: AICD in stable position. Stable cardiomegaly with diffuse bilateral pulmonary infiltrates most consistent pulmonary edema. Bilateral pneumonia cannot be excluded. Small bilateral pleural effusions. No interim change. Persistent prominent distended loops of small bowel are noted. Relative paucity of intra colonic gas is present. Findings consistent with small-bowel obstruction. Small-bowel diameter up to 4.6 cm. No free air. Pelvic calcifications again noted consistent calcified fibroids as previously noted on prior CT. Degenerative changes lumbar spine and both hips. Surgical clips right quadrant. IMPRESSION: 1. AICD in stable position. Persistent changes of congestive heart failure bilateral pulmonary edema small pleural effusions. 2. Persistent changes of small-bowel obstruction. Small-bowel dilatation now measures up to 4.6 cm. Electronically Signed   By: Marcello Moores  Register   On: 08/09/2017 11:03   Dg Foot Complete Left  Result Date: 07/12/2017 Please see detailed radiograph report in office note.  [name] Sublimity Soft Tissue Non Vascular  Result Date: 08/08/2017 CLINICAL DATA:  Left antecubital fossa swelling after blood draw. EXAM: ULTRASOUND LEFT UPPER EXTREMITY LIMITED TECHNIQUE: Ultrasound examination of the upper extremity soft tissues was performed in the area of clinical concern. COMPARISON:  None. FINDINGS: There is subcutaneous edema in the area of clinical concern along the left antecubital fossa and proximal volar forearm. No discrete fluid collection. IMPRESSION: Subcutaneous edema in the left antecubital fossa and proximal volar  forearm. No fluid collection. Electronically Signed   By: Titus Dubin M.D.   On: 08/08/2017 18:54     CBC Recent Labs  Lab 08/05/17 0339 08/07/17 0411 08/09/17 1848 08/10/17 0050  WBC 5.9 8.2 7.2 7.5  HGB 11.1* 10.6* 11.0* 9.9*  HCT 34.8* 32.9* 32.0* 29.7*  PLT 184 184 192 198  MCV 89.5 88.9 85.3 86.1  MCH 28.5 28.6 29.3 28.7  MCHC 31.9 32.2 34.4 33.3  RDW 15.5 15.8* 15.3 15.4   Chemistries  Recent Labs  Lab 08/07/17 0411 08/08/17 0350 08/09/17 0339 08/09/17 1848 08/10/17 0050 08/10/17 0655  NA 142 141 138 139 137  --   K 4.0 3.6 3.2* 3.0* 3.1*  --   CL 116* 109 103 102 100*  --   CO2 14* 15* 21* 24 23  --   GLUCOSE 92 103* 118* 134* 126*  --   BUN 43* 41* 39* 37* 36*  --   CREATININE 2.15* 2.21* 2.11* 2.01* 1.97*  --   CALCIUM 8.6* 8.8* 8.6* 8.7* 8.2*  --   MG  --   --   --   --   --  1.8  AST  --   --   --  15 14*  --   ALT  --   --   --  13* 11*  --   ALKPHOS  --   --   --  41 36*  --   BILITOT  --   --   --  0.6 0.7  --    ------------------------------------------------------------------------------------------------------------------ No results for input(s): CHOL, HDL, LDLCALC, TRIG, CHOLHDL, LDLDIRECT in the last 72 hours.  Lab Results  Component Value Date   HGBA1C 6.8 (H) 06/07/2017   ------------------------------------------------------------------------------------------------------------------ No results for input(s): TSH, T4TOTAL, T3FREE, THYROIDAB in the last 72 hours.  Invalid input(s): FREET3 ------------------------------------------------------------------------------------------------------------------ No results for input(s): VITAMINB12, FOLATE, FERRITIN, TIBC, IRON, RETICCTPCT in the last 72 hours.  Coagulation profile No results for input(s): INR, PROTIME in the last 168 hours.  No results for input(s): DDIMER in the last 72 hours.  Cardiac Enzymes Recent Labs  Lab 08/09/17 1848 08/10/17 0050 08/10/17 0655  TROPONINI 0.08*  0.08* 0.07*   ------------------------------------------------------------------------------------------------------------------    Component Value Date/Time   BNP 547.9 (H) 07/31/2017 1229   Roxan Hockey M.D on 08/10/2017 at 6:58 PM  Between 7am to 7pm - Pager - [phone]  After 7pm go to [email or web address] - password [email or web address]  [facility] -  Office  [phone]  Voice Recognition Viviann Spare dictation system was used to create this note, attempts have been made to correct errors. Please contact the author with questions and/or clarifications.

## 2017-08-10 NOTE — Care Management Note (Signed)
Case Management Note  Patient Details  Name: Morgan Clay MRN: [id number] Date of Birth: 1945-05-10  Subjective/Objective:  Transferred to SDU. Noted declined SNF.[facility] already following for HHC, dme- rw.                   Action/Plan:d/c plan home w/HHC.   Expected Discharge Date:  (unknown)               Expected Discharge Plan:  [place]  In-House Referral:     Discharge planning Services  CM Consult  Post Acute Care Choice:    Choice offered to:  Patient  DME Arranged:  [place] [facility] DME Agency:  [place] Arranged:  PT, OT, Nurse's Aide [place] Agency:  [place]  Status of Service:  In process, will continue to follow  If discussed at Long Length of Stay Meetings, dates discussed:    Additional Comments:  Dessa Phi, RN 08/10/2017, 12:58 PM

## 2017-08-10 NOTE — Progress Notes (Signed)
Pt resting comfortably on 3Lpm nasal cannula.  Pt has normal WOB with respiratory rate around 17 breaths per minute.  No BiPAP indicated at this time.

## 2017-08-10 NOTE — Progress Notes (Signed)
CRITICAL VALUE ALERT  Critical Value:  Trop 0.08  Date & Time Notied:  08/10/17 @ 2426  Provider Notified: Harvest [place], MD  Orders Received/Actions taken: pending

## 2017-08-10 NOTE — Progress Notes (Signed)
Physical Therapy Treatment Patient Details Name: Morgan Clay MRN: [id number] DOB: [birth year] Today's Date: 08/10/2017    History of Present Illness 72 yo female admitted to [facility] on 07/31/2017 with abdominal pain.  She is foind to have Acute sigmoid diverticulitis and enteritis.  She has been having pain and limited mobilty with frequent diarrhea     PT Comments    Pt assisted OOB and to recliner.  Upon standing, pt felt unable to tolerate ambulating today especially due to edema.  RN notified.  LEs and L UE elevated with pillows.  Continue to recommend SNF although per chart review pt prefers d/c home.    Follow Up Recommendations  SNF     Equipment Recommendations  Rolling walker with 5" wheels    Recommendations for Other Services       Precautions / Restrictions Precautions Precautions: Fall Precaution Comments: fatigues quickly, monitor sats    Mobility  Bed Mobility Overal bed mobility: Needs Assistance Bed Mobility: Supine to Sit     Supine to sit: Min assist     General bed mobility comments: slight assist for left arm due to edema  Transfers Overall transfer level: Needs assistance Equipment used: Rolling walker (2 wheeled) Transfers: Sit to/from [facility] Sit to Stand: Min assist Stand pivot transfers: Min assist       General transfer comment: verbal cues for safe technique, assist to rise, steady and control descent, pt felt unable to tolerate ambulation, remained on 3L O2 [place] and SPO2 in upper 90s  Ambulation/Gait                 Stairs            Wheelchair Mobility    Modified Rankin (Stroke Patients Only)       Balance                                            Cognition Arousal/Alertness: Awake/alert Behavior During Therapy: WFL for tasks assessed/performed Overall Cognitive Status: Within Functional Limits for tasks assessed                                         Exercises      General Comments        Pertinent Vitals/Pain Pain Assessment: 0-10 Pain Score: 8  Pain Location: L arm, side and foot, edematous L UE and slightly edematous lower legs Pain Descriptors / Indicators: Sore;Tightness Pain Intervention(s): Repositioned;Limited activity within patient's tolerance(RN notified)    Home Living                      Prior Function            PT Goals (current goals can now be found in the care plan section) Acute Rehab PT Goals PT Goal Formulation: With patient Time For Goal Achievement: 08/24/17 Progress towards PT goals: Not progressing toward goals - comment(up to chair but did not feel able to ambulate)    Frequency    Min 2X/week      PT Plan Current plan remains appropriate    Co-evaluation              AM-PAC PT "6 Clicks" Daily Activity  Outcome Measure  Difficulty turning over in bed (  including adjusting bedclothes, sheets and blankets)?: A Lot Difficulty moving from lying on back to sitting on the side of the bed? : A Lot Difficulty sitting down on and standing up from a chair with arms (e.g., wheelchair, bedside commode, etc,.)?: Unable Help needed moving to and from a bed to chair (including a wheelchair)?: A Little Help needed walking in hospital room?: A Lot Help needed climbing 3-5 steps with a railing? : Total 6 Click Score: 11    End of Session Equipment Utilized During Treatment: Gait belt;Oxygen Activity Tolerance: Patient limited by pain;Patient limited by fatigue Patient left: with call bell/phone within reach;in chair;with family/visitor present Nurse Communication: Mobility status PT Visit Diagnosis: Difficulty in walking, not elsewhere classified (R26.2)     Time: 7014-1030 PT Time Calculation (min) (ACUTE ONLY): 17 min  Charges:  $Therapeutic Activity: 8-22 mins                    G Codes:       Carmelia Bake, PT, DPT 08/10/2017 Pager: [phone]  York Ram  E 08/10/2017, 1:43 PM

## 2017-08-10 NOTE — Progress Notes (Signed)
CRITICAL VALUE ALERT  Critical Value:  Trop 0.08  Date & Time Notied:  @2000  08/09/17  Provider Notified: Harvest Forest, MD  Orders Received/Actions taken: Verbal orders to continue tx

## 2017-08-11 ENCOUNTER — Inpatient Hospital Stay (HOSPITAL_COMMUNITY): Payer: Medicare Other

## 2017-08-11 DIAGNOSIS — I5043 Acute on chronic combined systolic (congestive) and diastolic (congestive) heart failure: Secondary | ICD-10-CM

## 2017-08-11 DIAGNOSIS — N183 Chronic kidney disease, stage 3 (moderate): Secondary | ICD-10-CM

## 2017-08-11 DIAGNOSIS — I130 Hypertensive heart and chronic kidney disease with heart failure and stage 1 through stage 4 chronic kidney disease, or unspecified chronic kidney disease: Secondary | ICD-10-CM

## 2017-08-11 DIAGNOSIS — I340 Nonrheumatic mitral (valve) insufficiency: Secondary | ICD-10-CM

## 2017-08-11 DIAGNOSIS — N179 Acute kidney failure, unspecified: Secondary | ICD-10-CM

## 2017-08-11 DIAGNOSIS — I484 Atypical atrial flutter: Secondary | ICD-10-CM

## 2017-08-11 DIAGNOSIS — I351 Nonrheumatic aortic (valve) insufficiency: Secondary | ICD-10-CM

## 2017-08-11 LAB — ECHOCARDIOGRAM COMPLETE
HEIGHTINCHES: 59 in
Weight: 2864.22 oz

## 2017-08-11 LAB — BASIC METABOLIC PANEL
Anion gap: 12 (ref 5–15)
BUN: 40 mg/dL — AB (ref 6–20)
CO2: 24 mmol/L (ref 22–32)
CREATININE: 2.39 mg/dL — AB (ref 0.44–1.00)
Calcium: 8.8 mg/dL — ABNORMAL LOW (ref 8.9–10.3)
Chloride: 104 mmol/L (ref 101–111)
GFR calc Af Amer: 23 mL/min — ABNORMAL LOW (ref >60)
GFR, EST NON AFRICAN AMERICAN: 20 mL/min — AB (ref >60)
Glucose, Bld: 119 mg/dL — ABNORMAL HIGH (ref 65–99)
Potassium: 5 mmol/L (ref 3.5–5.1)
SODIUM: 140 mmol/L (ref 135–145)

## 2017-08-11 LAB — CBC
HCT: 31.5 % — ABNORMAL LOW (ref 36.0–46.0)
Hemoglobin: 10.3 g/dL — ABNORMAL LOW (ref 12.0–15.0)
MCH: 28.7 pg (ref 26.0–34.0)
MCHC: 32.7 g/dL (ref 30.0–36.0)
MCV: 87.7 fL (ref 78.0–100.0)
PLATELETS: 194 10*3/uL (ref 150–400)
RBC: 3.59 MIL/uL — ABNORMAL LOW (ref 3.87–5.11)
RDW: 16.1 % — AB (ref 11.5–15.5)
WBC: 8.7 10*3/uL (ref 4.0–10.5)

## 2017-08-11 LAB — GLUCOSE, CAPILLARY
GLUCOSE-CAPILLARY: 159 mg/dL — AB (ref 65–99)
GLUCOSE-CAPILLARY: 102 mg/dL — AB (ref 65–99)
Glucose-Capillary: 116 mg/dL — ABNORMAL HIGH (ref 65–99)
Glucose-Capillary: 121 mg/dL — ABNORMAL HIGH (ref 65–99)

## 2017-08-11 MED ORDER — FUROSEMIDE 10 MG/ML IJ SOLN: 80.0000 mg | Freq: Two times a day (BID) | INTRAMUSCULAR | Status: DC

## 2017-08-11 MED ORDER — FUROSEMIDE 10 MG/ML IJ SOLN
120.0000 mg | Freq: Three times a day (TID) | INTRAVENOUS | Status: DC
  Administered 2017-08-11 – 2017-08-12 (×2): 120 mg via INTRAVENOUS
  Filled 2017-08-11: qty 12
  Filled 2017-08-11: qty 10
  Filled 2017-08-11: qty 12

## 2017-08-11 NOTE — Progress Notes (Signed)
Patient Demographics:    Morgan Clay, is a 72 y.o. female, DOB - [birth year], NTI:[id number]  Admit date - 07/31/2017   Admitting Physician Adam Sharene Butters, MD  Outpatient Primary MD for the patient is [name], DO  LOS - 11  Chief Complaint  Patient presents with  . Abdominal Pain  . Shortness of Breath        Subjective:    [name] today has no fevers, no emesis,  No further chest pain,  Sob is better, urine output is improving slightly after higher dose Lasix,   Assessment  & Plan :    Principal Problem:   Pulmonary edema, acute (HCC) Active Problems:   Hypothyroidism   Obesity   OBSTRUCTIVE SLEEP APNEA   Type 2 diabetes, controlled, with renal manifestation (HCC)   Chronic kidney disease - followed by [facility]   Chronic obstructive pulmonary disease ([place])   Hypertension associated with diabetes ([place])   Persistent atrial fibrillation (HCC)   Diverticulitis   ARF (acute renal failure) (HCC)   Enteritis   Partial small bowel obstruction (HCC)   Pain and swelling of left upper extremity   Brief Narrative: Morgan Clay a 72 y.o.femalewith medical history significantfor NICM (last EF 45%) s/pCRT-D, A. fib on Eliquis with recent DCCV on 07/24/17, OSA on CPAP, HCV status post Harvoni (viremic response not documented), hypothyroidism, depression and diet managed type 2 diabetes mellitus who presents to the ED with 5 days of diffuse, generalized abdominal pain, with associated anorexia, nausea and one episode of profuse nonbloody, nonbilious emesis on the day of presentation.  CT abdomen and pelvis without contrast showed Suspected mild acute diverticulitis involving the proximal sigmoid colon. Scattered small bowel air-fluid levels and with questionable wall thickening of a few left-sided small bowel loops, query enteritis.  Admitted on 07/31/2017 with the above  findings with concerns about possible partial SBO versus diverticulitis versus colitis as well as concerns about possible pneumonia    Plan:- 1)Acute Sigmoid Diverticulitis Vs Colitis--tolerating GI soft diet well, having bowel movements, No further fevers,  She has completed 7 days of IV zosyn in addition to 2 days of Unasyn.    Surgical consult appreciated, surgery recommended discontinuation of antibiotics, surgical service signed off as of 08/09/2017.  Abdominal imaging studies continues to suggest possible small bowel obstruction, however clinically patient is tolerating oral intake and having bowel movements   2)AKI----acute kidney injury on CKD stage III -     creatinine on admission= 2.1 ,   baseline creatinine = 1.8   , creatinine is now= 2.3   , creatinine peaked at 3.77,  Avoid nephrotoxic agents/dehydration/hypotension, nephrology consult appreciated, patient was treated with bicarb drip  3)H/o HFpEF-- developed acute on chronic combined systolic/diastolic CHF exacerbation on 08/09/17, she has history of chronic combined diastolic and systolic dysfunction CHF, echo from 02/24/2017 shows improved EF of 45-50% (echo from 08/11/17 now suggest EF of 60-65% with grade 2 diastolic dysfunction CHF) patient was on Entresto, Aldactone prior to admission, both are currently on hold, discussed with Dr. Rayann Heman from cardiology service on 08/11/2017 also discussed with Dr. Jonnie Finner from nephrology service on 08/11/2017, will increase Lasix to 80 mg IV twice daily to achieve better diuresis,  cardiology has  requested nephrology's input with regards to when Delene Loll can be restarted, continue isosorbide/hydralazine (Bidil) combo, continue Coreg 2.5 mg twice daily  4)Chronic atrial fibrillation rate controlled -continue amiodarone and Coreg for rate control, restarted Eliquis on 08/09/17  5)Hypothyroidism----  resume Synthroid.  6)Disposition /Generalized weakness and debility-dyspnea on exertion, hypoxia,  given further decompensation and generalized weakness and debility patient and daughter are now more agreeable to skilled nursing facility rehab post acute issues  7)History of hepatitis C status post Harwani.  Outpatient follow-up with PCP.[name]  8)Diabetes mellitus-last A1c 6.8 reflecting good control, Use Novolog/Humalog Sliding scale insulin with Accu-Cheks/Fingersticks as ordered   9) mild normocytic anemia-may be hemodilution, baseline hemoglobin usually around 11 hemoglobin currently above 10, no evidence of bleeding watch closely  10)Chest Pain-resolved, troponins noted, EKG with paced rhythm,   DVT prophylaxis: ELIQUIS   Code Status: Full code Family Communication: daughter [name] at bedside Disposition Plan:  SNF Rehab Consultants:   Surgery.   Nephrology Cardiology  Procedures: None  Antimicrobials: completed zosyn/unasyn   DVT Prophylaxis  :  Eliquis  Lab Results  Component Value Date   PLT 194 08/11/2017    Inpatient Medications  Scheduled Meds: . amiodarone  200 mg Oral Daily  . apixaban  5 mg Oral BID  . carvedilol  12.5 mg Oral BID WC  . feeding supplement  1 Container Oral TID BM  . furosemide  80 mg Intravenous Q12H  . insulin aspart  0-5 Units Subcutaneous QHS  . insulin aspart  0-9 Units Subcutaneous TID WC  . isosorbide-hydrALAZINE  1 tablet Oral BID  . lactobacillus  1 g Oral TID WC  . levothyroxine  125 mcg Oral QAC breakfast  . metoCLOPramide (REGLAN) injection  5 mg Intravenous Q8H  . pantoprazole (PROTONIX) IV  40 mg Intravenous Q24H  . sertraline  50 mg Oral Daily   Continuous Infusions:  PRN Meds:.acetaminophen **OR** acetaminophen, albuterol, hydrALAZINE, iopamidol, menthol-cetylpyridinium, morphine injection, ondansetron (ZOFRAN) IV, oxyCODONE, phenol, promethazine, traMADol, traZODone    Anti-infectives (From admission, onward)   Start     Dose/Rate Route Frequency Ordered Stop   08/07/17 2000  piperacillin-tazobactam  (ZOSYN) IVPB 3.375 g     3.375 g 12.5 mL/hr over 240 Minutes Intravenous Every 8 hours 08/07/17 1227 08/10/17 0250   08/02/17 1800  piperacillin-tazobactam (ZOSYN) IVPB 2.25 g  Status:  Discontinued     2.25 g 100 mL/hr over 30 Minutes Intravenous Every 6 hours 08/02/17 1737 08/07/17 1226   07/31/17 2200  Ampicillin-Sulbactam (UNASYN) 3 g in sodium chloride 0.9 % 100 mL IVPB  Status:  Discontinued     3 g 200 mL/hr over 30 Minutes Intravenous Every 12 hours 07/31/17 2132 08/02/17 1708   07/31/17 1830  cefTRIAXone (ROCEPHIN) 2 g in sodium chloride 0.9 % 100 mL IVPB     2 g 200 mL/hr over 30 Minutes Intravenous  Once 07/31/17 1822 07/31/17 1919   07/31/17 1830  metroNIDAZOLE (FLAGYL) IVPB 500 mg  Status:  Discontinued     500 mg 100 mL/hr over 60 Minutes Intravenous  Once 07/31/17 1822 07/31/17 2053   07/31/17 1830  doxycycline (VIBRAMYCIN) 100 mg in sodium chloride 0.9 % 250 mL IVPB  Status:  Discontinued     100 mg 125 mL/hr over 120 Minutes Intravenous  Once 07/31/17 1822 07/31/17 2053        Objective:   Vitals:   08/11/17 1000 08/11/17 1200 08/11/17 1400 08/11/17 1600  BP: (!) 208/82 (!) 146/71 (!) 126/56  Pulse: 82     Resp: (!) 21 12 11    Temp:  98.3 F (36.8 C)  97.9 F (36.6 C)  TempSrc:  Oral  Oral  SpO2: 94% 97%    Weight:      Height:        Wt Readings from Last 3 Encounters:  08/11/17 81.2 kg (179 lb 0.2 oz)  07/19/17 68.5 kg (151 lb)  06/26/17 68 kg (150 lb)     Intake/Output Summary (Last 24 hours) at 08/11/2017 1848 Last data filed at 08/11/2017 1802 Gross per 24 hour  Intake -  Output 345 ml  Net -345 ml     Physical Exam  Gen:- Awake Alert,  In no apparent distress  HEENT:- [place].AT, No sclera icterus Nose- [place] 3 L/min Neck-Supple Neck,No JVD,.  Lungs-diminished in bases, faint bibasilar rales, no wheezing CV- S1, S2 normal, irregularly irregular, AICD in situ Abd-  +ve B.Sounds, Abd Soft, No tenderness,    Extremity/Skin:-Good pulses,  skin is  warm and dry  psych-affect is appropriate, oriented x3 Neuro-generalized weakness without any new focal deficits, no tremors   Data Review:   Micro Results Recent Results (from the past 240 hour(s))  C difficile quick scan w PCR reflex     Status: None   Collection Time: 08/06/17 10:16 AM  Result Value Ref Range Status   C Diff antigen NEGATIVE NEGATIVE Final   C Diff toxin NEGATIVE NEGATIVE Final   C Diff interpretation No C. difficile detected.  Final    Comment: Performed at [facility], [place] [address] [place], [place] [address]  MRSA PCR Screening     Status: None   Collection Time: 08/09/17  8:00 PM  Result Value Ref Range Status   MRSA by PCR NEGATIVE NEGATIVE Final    Comment:        The GeneXpert MRSA Assay (FDA approved for NASAL specimens only), is one component of a comprehensive MRSA colonization surveillance program. It is not intended to diagnose MRSA infection nor to guide or monitor treatment for MRSA infections. Performed at [facility], [place] [address], [place], [place] [address]     Radiology Reports Ct Abdomen Pelvis Wo Contrast  Result Date: 08/03/2017 CLINICAL DATA:  Lower abdominal pain. EXAM: CT ABDOMEN AND PELVIS WITHOUT CONTRAST TECHNIQUE: Multidetector CT imaging of the abdomen and pelvis was performed following the standard protocol without IV contrast. COMPARISON:  CT scan of July 31, 2017. FINDINGS: Lower chest: Mild bibasilar subsegmental atelectasis or pneumonia is noted. Hepatobiliary: No gallstones are noted. No focal hepatic lesion is noted. Mildly nodular hepatic margins are noted suggesting cirrhosis. Pancreas: Unremarkable. No pancreatic ductal dilatation or surrounding inflammatory changes. Spleen: Normal in size without focal abnormality. Adrenals/Urinary Tract: Adrenal glands are unremarkable. Kidneys are normal, without renal calculi, focal lesion, or hydronephrosis. Bladder is unremarkable.  Stomach/Bowel: The stomach appears normal. Moderate small bowel dilatation is noted concerning for ileus or distal small bowel obstruction. Proximal sigmoid diverticulitis is again noted. Vascular/Lymphatic: Aortic atherosclerosis. No enlarged abdominal or pelvic lymph nodes. Reproductive: Large calcified uterine fibroid is noted. No adnexal abnormality is noted. Other: No abnormal fluid collection is noted. Mild fluid-filled left inguinal hernia is noted. Musculoskeletal: No acute or significant osseous findings. IMPRESSION: Interval development of moderate small bowel dilatation is noted concerning for ileus or distal small bowel obstruction. Proximal sigmoid diverticulitis is again noted. Mildly nodular hepatic margins are noted suggesting hepatic cirrhosis. Large calcified uterine fibroid is noted. Mild bibasilar  subsegmental atelectasis or pneumonia is noted. Aortic Atherosclerosis (ICD10-I70.0). Electronically Signed   By: Marijo Conception, M.D.   On: 08/03/2017 17:16   Ct Abdomen Pelvis Wo Contrast  Result Date: 07/31/2017 CLINICAL DATA:  Abdominal pain, distention, nausea, and vomiting. EXAM: CT ABDOMEN AND PELVIS WITHOUT CONTRAST TECHNIQUE: Multidetector CT imaging of the abdomen and pelvis was performed following the standard protocol without IV contrast. COMPARISON:  Right upper quadrant abdominal ultrasound 02/23/2014. Renal ultrasound 10/01/2013. Abdominal ultrasound 04/06/2011. FINDINGS: Lower chest: Patchy consolidation in both lower lobes, incompletely imaged. No pleural effusion. Partially visualized ICD leads and cardiomegaly. Hepatobiliary: Slightly nodular liver contour without focal abnormality identified on this unenhanced study. The layering hyperdense material in the gallbladder lumen without gross wall thickening or pericholecystic inflammation. No biliary dilatation. Pancreas: Unremarkable. Spleen: Unremarkable. Adrenals/Urinary Tract: Unremarkable adrenal glands. No evidence of renal  mass, calculi, or hydronephrosis. Mildly distended urinary bladder without evidence of wall thickening. Stomach/Bowel: The stomach is within normal limits. Scattered diverticula are noted in the distal descending and sigmoid colon, and there is the suggestion of mild wall thickening of the proximal sigmoid colon with mild surrounding fat stranding. Scattered air-fluid levels are present in multiple small bowel loops without bowel dilatation suggestive of a significant mechanical obstruction. There is a question of mild wall thickening involving a few small bowel loops in the left mid to lower abdomen. Prior appendectomy. Vascular/Lymphatic: Abdominal aortic atherosclerosis without aneurysm. No enlarged lymph nodes. Reproductive: Multiple calcified uterine fibroids including a 6 cm anterior fibroid resulting in impression on the bladder. No adnexal mass. Other: No intraperitoneal free fluid. Mild nonspecific haziness throughout the mesenteric fat. No abdominal wall hernia. Musculoskeletal: Lower lumbar disc bulging and moderate facet arthrosis. IMPRESSION: 1. Suspected mild acute diverticulitis involving the proximal sigmoid colon. 2. Scattered small bowel air-fluid levels and with questionable wall thickening of a few left-sided small bowel loops, query enteritis. 3. Bilateral lower lobe lung opacities compatible with pneumonia. 4. Slightly nodular contour of the liver which could reflect mild changes of cirrhosis. 5. Layering density in the gallbladder lumen may represent concentrated bile and/or small stones. 6. Fibroid uterus. Electronically Signed   By: Logan Bores M.D.   On: 07/31/2017 18:17   Dg Chest 2 View  Result Date: 08/08/2017 CLINICAL DATA:  72 year old female with shortness of breath and left upper extremity swelling. EXAM: CHEST - 2 VIEW COMPARISON:  07/31/2017 and earlier. FINDINGS: Bilateral basilar predominant increased interstitial and patchy indistinct perihilar opacity. Small bilateral  pleural effusions are suspected. Lung volumes are lower than in November 2018. No pneumothorax. Stable cardiomegaly and mediastinal contours. Stable left chest cardiac AICD. No acute osseous abnormality identified. Negative visible bowel gas pattern. IMPRESSION: Acute perihilar pulmonary edema suspected with small bilateral pleural effusions. Acute viral/atypical respiratory infection felt less likely. Electronically Signed   By: Genevie Ann M.D.   On: 08/08/2017 13:23   Dg Chest 2 View  Result Date: 07/31/2017 CLINICAL DATA:  Chest pain short of breath EXAM: CHEST - 2 VIEW COMPARISON:  03/22/2017 FINDINGS: Hypoventilation with bibasilar atelectasis/infiltrate. Negative for heart failure or effusion. AICD unchanged in position IMPRESSION: Hypoventilation with bibasilar atelectasis/infiltrate. Electronically Signed   By: Franchot Gallo M.D.   On: 07/31/2017 13:04   US Renal  Result Date: 08/03/2017 CLINICAL DATA:  Acute renal failure, history hypertension, hyperlipidemia, CHF, non ischemic cardiomyopathy EXAM: RENAL / URINARY TRACT ULTRASOUND COMPLETE COMPARISON:  CT abdomen and pelvis 07/31/2017 FINDINGS: Right Kidney: Length: 9.5 cm. Normal cortical thickness. Increased cortical  echogenicity. No mass, hydronephrosis, or or shadowing calcification. Left Kidney: Length: 10.6 cm. Normal cortical thickness. Increased cortical echogenicity. No mass, hydronephrosis, or or shadowing calcification. Bladder: Appears normal for degree of bladder distention. BILATERAL ureteral jets noted. IMPRESSION: Medical renal disease changes of the kidneys. Electronically Signed   By: Lavonia [name] M.D.   On: 08/03/2017 15:58   Dg Abd 2 Views  Result Date: 08/06/2017 CLINICAL DATA:  Small-bowel obstruction. History of an appendectomy. EXAM: ABDOMEN - 2 VIEW COMPARISON:  CT, 08/03/2017 FINDINGS: There are dilated loops of small bowel with no colonic distention, similar to the prior CT. Air-fluid levels are noted on the decubitus  view. No free air. Calcified fibroid is noted in the central pelvis. Surgical clips the right lower quadrant reflect prior appendectomy. IMPRESSION: 1. Persistent partial small bowel obstruction. No significant change from the prior CT scan. No free air. Electronically Signed   By: Lajean Manes M.D.   On: 08/06/2017 09:22   Dg Abd Acute W/chest  Result Date: 08/11/2017 CLINICAL DATA:  72 year old female with cough, syncope, dyspnea and abdominal distension EXAM: DG ABDOMEN ACUTE W/ 1V CHEST COMPARISON:  Prior acute abdominal series 08/09/2017 FINDINGS: Left subclavian approach biventricular cardiac rhythm maintenance device remains in unchanged position with leads projecting over the right atrium, right ventricle and overlying the left ventricle. Stable cardiomegaly. Atherosclerotic calcifications again noted in the transverse aorta. Slight interval increase in pulmonary vascular congestion now with mild edema. Interval development of small bilateral pleural effusions. No evidence of free air on the semi-erect or lateral decubitus views. Nonspecific bowel gas pattern with gas in multiple loops of small bowel. There does appear to be diffuse mild small bowel wall thickening with thickening of the valvulae. Dystrophic calcified uterine fibroid. No acute osseous abnormality. IMPRESSION: 1. Developing CHF with increased pulmonary vascular congestion, mild pulmonary edema and new small bilateral pleural effusions. 2. Mild gaseous distention of several loops of small bowel in the mid abdomen with evidence of submucosal thickening/thickening of the valvulae. Differential considerations are broad, but most commonly this represents either infectious or inflammatory enteritis. 3. Cardiomegaly. 4.  Aortic Atherosclerosis (ICD10-170.0) Electronically Signed   By: Jacqulynn Cadet M.D.   On: 08/11/2017 13:15   Dg Abd Acute W/chest  Result Date: 08/09/2017 CLINICAL DATA:  Severe abdominal pain.  Abdominal distention.  EXAM: DG ABDOMEN ACUTE W/ 1V CHEST COMPARISON:  Chest x-ray 08/08/2017. Abdomen series 08/06/2017. CT 08/03/2017. FINDINGS: AICD in stable position. Stable cardiomegaly with diffuse bilateral pulmonary infiltrates most consistent pulmonary edema. Bilateral pneumonia cannot be excluded. Small bilateral pleural effusions. No interim change. Persistent prominent distended loops of small bowel are noted. Relative paucity of intra colonic gas is present. Findings consistent with small-bowel obstruction. Small-bowel diameter up to 4.6 cm. No free air. Pelvic calcifications again noted consistent calcified fibroids as previously noted on prior CT. Degenerative changes lumbar spine and both hips. Surgical clips right quadrant. IMPRESSION: 1. AICD in stable position. Persistent changes of congestive heart failure bilateral pulmonary edema small pleural effusions. 2. Persistent changes of small-bowel obstruction. Small-bowel dilatation now measures up to 4.6 cm. Electronically Signed   By: Marcello Moores  Register   On: 08/09/2017 11:03   [name] Lt Upper Extrem Ltd Soft Tissue Non Vascular  Result Date: 08/08/2017 CLINICAL DATA:  Left antecubital fossa swelling after blood draw. EXAM: ULTRASOUND LEFT UPPER EXTREMITY LIMITED TECHNIQUE: Ultrasound examination of the upper extremity soft tissues was performed in the area of clinical concern. COMPARISON:  None. FINDINGS: There is subcutaneous  edema in the area of clinical concern along the left antecubital fossa and proximal volar forearm. No discrete fluid collection. IMPRESSION: Subcutaneous edema in the left antecubital fossa and proximal volar forearm. No fluid collection. Electronically Signed   By: Titus Dubin M.D.   On: 08/08/2017 18:54     CBC Recent Labs  Lab 08/05/17 0339 08/07/17 0411 08/09/17 1848 08/10/17 0050 08/11/17 0319  WBC 5.9 8.2 7.2 7.5 8.7  HGB 11.1* 10.6* 11.0* 9.9* 10.3*  HCT 34.8* 32.9* 32.0* 29.7* 31.5*  PLT 184 184 192 198 194  MCV 89.5 88.9  85.3 86.1 87.7  MCH 28.5 28.6 29.3 28.7 28.7  MCHC 31.9 32.2 34.4 33.3 32.7  RDW 15.5 15.8* 15.3 15.4 16.1*   Chemistries  Recent Labs  Lab 08/08/17 0350 08/09/17 0339 08/09/17 1848 08/10/17 0050 08/10/17 0655 08/11/17 0319  NA 141 138 139 137  --  140  K 3.6 3.2* 3.0* 3.1*  --  5.0  CL 109 103 102 100*  --  104  CO2 15* 21* 24 23  --  24  GLUCOSE 103* 118* 134* 126*  --  119*  BUN 41* 39* 37* 36*  --  40*  CREATININE 2.21* 2.11* 2.01* 1.97*  --  2.39*  CALCIUM 8.8* 8.6* 8.7* 8.2*  --  8.8*  MG  --   --   --   --  1.8  --   AST  --   --  15 14*  --   --   ALT  --   --  13* 11*  --   --   ALKPHOS  --   --  41 36*  --   --   BILITOT  --   --  0.6 0.7  --   --    ------------------------------------------------------------------------------------------------------------------ No results for input(s): CHOL, HDL, LDLCALC, TRIG, CHOLHDL, LDLDIRECT in the last 72 hours.  Lab Results  Component Value Date   HGBA1C 6.8 (H) 06/07/2017   ------------------------------------------------------------------------------------------------------------------ No results for input(s): TSH, T4TOTAL, T3FREE, THYROIDAB in the last 72 hours.  Invalid input(s): FREET3 ------------------------------------------------------------------------------------------------------------------ No results for input(s): VITAMINB12, FOLATE, FERRITIN, TIBC, IRON, RETICCTPCT in the last 72 hours.  Coagulation profile No results for input(s): INR, PROTIME in the last 168 hours.  No results for input(s): DDIMER in the last 72 hours.  Cardiac Enzymes Recent Labs  Lab 08/09/17 1848 08/10/17 0050 08/10/17 0655  TROPONINI 0.08* 0.08* 0.07*   ------------------------------------------------------------------------------------------------------------------    Component Value Date/Time   BNP 547.9 (H) 07/31/2017 1229   Roxan Hockey M.D on 08/11/2017 at 6:48 PM  Between 7am to 7pm - Pager -  [phone]  After 7pm go to [email or web address] - password [email or web address]  [facility] -  Office  [phone]  Voice Recognition Viviann Spare dictation system was used to create this note, attempts have been made to correct errors. Please contact the author with questions and/or clarifications.

## 2017-08-11 NOTE — Progress Notes (Signed)
Report called and given to [name] on 4th floor and pt to be transferred after shift change since this writer still has evening meds that need to be administered with not ample time to do so. Morgan Clay.

## 2017-08-11 NOTE — Progress Notes (Signed)
[facility] Progress Note  Subjective: asked to see again for vol overload, CKD/ AKI.  Creat 2.3 but wt's climbing, up to 81kg from 74kg five days ago.  Pt has some SOB, no severe SOB.  CXR on 4/3 showed worsening pulm edema.      Vitals:   08/11/17 1000 08/11/17 1200 08/11/17 1400 08/11/17 1600  BP: (!) 208/82 (!) 146/71 (!) 126/56   Pulse: 82     Resp: (!) 21 12 11    Temp:  98.3 F (36.8 C)  97.9 F (36.6 C)  TempSrc:  Oral  Oral  SpO2: 94% 97%    Weight:      Height:        Inpatient medications: . amiodarone  200 mg Oral Daily  . apixaban  5 mg Oral BID  . carvedilol  12.5 mg Oral BID WC  . feeding supplement  1 Container Oral TID BM  . furosemide  80 mg Intravenous Q12H  . insulin aspart  0-5 Units Subcutaneous QHS  . insulin aspart  0-9 Units Subcutaneous TID WC  . isosorbide-hydrALAZINE  1 tablet Oral BID  . lactobacillus  1 g Oral TID WC  . levothyroxine  125 mcg Oral QAC breakfast  . metoCLOPramide (REGLAN) injection  5 mg Intravenous Q8H  . pantoprazole (PROTONIX) IV  40 mg Intravenous Q24H  . sertraline  50 mg Oral Daily    acetaminophen **OR** acetaminophen, albuterol, hydrALAZINE, iopamidol, menthol-cetylpyridinium, morphine injection, ondansetron (ZOFRAN) IV, oxyCODONE, phenol, promethazine, traMADol, traZODone  Exam: Alert, nasal O2 +jvd Chest bilat rales 1/3 up, no ^wob Cor RRR Abd tense, nontender, +bs Ext diffuse moderate pitting edema bilat LE's up to hips +UE edema as well, facial edema NF, ox 3  CXR 4/3 worsening pulm edema   Impression: 1  AKI/ CKD3  2  Vol overload / pulm edema - sig wt increase, poor UOP , diffuse edema, hx HFpEF; will ^^ lasix dose , need to get vol down 3  CKD 3- baseline creat 1.6- 1.9 4  Diverticulitis - sp 7 days iv abx 5  H/o HFpEF - as above 6  Chronic afib 7  DM on insulin 8  H/o hep C 9  Anemia - stable 10  HTN on bidil/ coreg, lasix  Plan - as above   Kelly Splinter MD [facility] pager [phone]   08/11/2017, 8:33 PM   Recent Labs  Lab 08/09/17 1848 08/10/17 0050 08/11/17 0319  NA 139 137 140  K 3.0* 3.1* 5.0  CL 102 100* 104  CO2 24 23 24   GLUCOSE 134* 126* 119*  BUN 37* 36* 40*  CREATININE 2.01* 1.97* 2.39*  CALCIUM 8.7* 8.2* 8.8*   Recent Labs  Lab 08/09/17 1848 08/10/17 0050  AST 15 14*  ALT 13* 11*  ALKPHOS 41 36*  BILITOT 0.6 0.7  PROT 7.8 7.2  ALBUMIN 3.3* 3.1*   Recent Labs  Lab 08/09/17 1848 08/10/17 0050 08/11/17 0319  WBC 7.2 7.5 8.7  HGB 11.0* 9.9* 10.3*  HCT 32.0* 29.7* 31.5*  MCV 85.3 86.1 87.7  PLT 192 198 194   Iron/TIBC/Ferritin/ %Sat No results found for: IRON, TIBC, FERRITIN, IRONPCTSAT

## 2017-08-11 NOTE — Progress Notes (Signed)
  Echocardiogram 2D Echocardiogram has been performed.  Morgan Clay 08/11/2017, 12:40 PM

## 2017-08-11 NOTE — Consult Note (Signed)
Cardiology Consultation:   Patient ID: Morgan Clay; [id number]; 1945-11-04   Admit date: 07/31/2017 Date of Consult: 08/11/2017  Primary Care Provider: [name], DO   Primary Electrophysiologist:  Dr Caryl Comes   Patient Profile:   Morgan Clay is a 72 y.o. female with a hx of persistent atypical atrial flutter/ atach, severe LA enlargement, OSA, CRI, and nonischemic CM who is being seen today for the evaluation of ongoing volume overload at the request of Dr Laurann Montana.  History of Present Illness:   [name] presented with atypical atrial flutter and was placed on amiodarone/ cardioverted by Dr Caryl Comes in early March.  She presented to [facility] with SBO 07/31/17.  She has been hospitalized and managed since that time.  Her hospitalization has been notable for worsening renal function.  She was evaluated by Dr Hollie Salk who felt that renal failure was due to hemodynamics in the setting of poor PO intake and medications.  Renal US revealed medical renal disease.  Her entresto and diuretics were discontinued.  She was given IVF and made some improvement.  Unfortunately, she has now developed volume overload with worsening SOB and edema.  Her urine output has diminished.  Cardiology is consulted for further management. She continues to have ongoing abdominal pain. Denies CP, palpitations, or dizziness.  Past Medical History:  Diagnosis Date  . Arthritis   . Asthma    reports mild asthma since childhood - had COPD on dx list from prior PCP  . Atypical atrial flutter ([place]) 09/28/2015  . Chronic systolic congestive heart failure ([place])   . DEPRESSION 12/17/2009   Annotation: PHQ-9 score = 14 done on 12/17/2009 Qualifier: Diagnosis of  By: Hassell Done FNP, Tori Milks    . FIBROIDS, UTERUS 03/05/2008  . Glaucoma   . HEPATITIS C - s/p treatment with Harvoni, seeing hepatology, Dawn Drazek 01/04/2007  . Hyperlipemia 12/06/2012  . HYPERTENSION, BENIGN 04/24/2007  . Hypothyroidism   . LBBB (left bundle  branch block)   . Nonischemic cardiomyopathy ([place])   . OBESITY 05/27/2009  . OBSTRUCTIVE SLEEP APNEA 11/14/2007   npsg 2009:  Mild osa with AHI 11/hr. Started cpap 2009, quit using due to lack of response Home sleep testing 09/2010 with AHI only 7/hr and weight loss advised by pulmonology.  . OSTEOPENIA 09/30/2008  . Personal history of other infectious and parasitic disease    Hepatitis B    Past Surgical History:  Procedure Laterality Date  . appendectomy    . CARDIAC CATHETERIZATION    . CARDIOVERSION N/A 12/14/2014   Procedure: CARDIOVERSION;  Surgeon: Dorothy Spark, MD;  Location: [facility] ENDOSCOPY;  Service: Cardiovascular;  Laterality: N/A;  . CARDIOVERSION N/A 02/26/2017   Procedure: CARDIOVERSION;  Surgeon: Evans Lance, MD;  Location: [place] CV LAB;  Service: Cardiovascular;  Laterality: N/A;  . CARDIOVERSION N/A 07/24/2017   Procedure: CARDIOVERSION;  Surgeon: Thayer Headings, MD;  Location: [facility] ENDOSCOPY;  Service: Cardiovascular;  Laterality: N/A;  . EP IMPLANTABLE DEVICE N/A 10/06/2015   Procedure: BIV ICD Generator Changeout;  Surgeon: [name], MD;  Location: [place] CV LAB;  Service: Cardiovascular;  Laterality: N/A;     Inpatient Medications: Scheduled Meds: . amiodarone  200 mg Oral Daily  . apixaban  5 mg Oral BID  . carvedilol  12.5 mg Oral BID WC  . feeding supplement  1 Container Oral TID BM  . furosemide  40 mg Intravenous Q12H  . insulin aspart  0-5 Units Subcutaneous QHS  . insulin aspart  0-9 Units Subcutaneous TID WC  . isosorbide-hydrALAZINE  1 tablet Oral BID  . lactobacillus  1 g Oral TID WC  . levothyroxine  125 mcg Oral QAC breakfast  . metoCLOPramide (REGLAN) injection  5 mg Intravenous Q8H  . pantoprazole (PROTONIX) IV  40 mg Intravenous Q24H  . sertraline  50 mg Oral Daily   Continuous Infusions:  PRN Meds: acetaminophen **OR** acetaminophen, albuterol, hydrALAZINE, iopamidol, menthol-cetylpyridinium, morphine injection,  ondansetron (ZOFRAN) IV, oxyCODONE, phenol, promethazine, traMADol, traZODone  Allergies:   No Known Allergies  Social History:   Social History   Socioeconomic History  . Marital status: Single    Spouse name: Not on file  . Number of children: Not on file  . Years of education: Not on file  . Highest education level: Not on file  Occupational History  . Not on file  Social Needs  . Financial resource strain: Not on file  . Food insecurity:    Worry: Not on file    Inability: Not on file  . Transportation needs:    Medical: Not on file    Non-medical: Not on file  Tobacco Use  . Smoking status: Never Smoker  . Smokeless tobacco: Never Used  Substance and Sexual Activity  . Alcohol use: Yes    Comment: 3 glasses of wine 3-4 times per week  . Drug use: No  . Sexual activity: Not on file  Lifestyle  . Physical activity:    Days per week: Not on file    Minutes per session: Not on file  . Stress: Not on file  Relationships  . Social connections:    Talks on phone: Not on file    Gets together: Not on file    Attends religious service: Not on file    Active member of club or organization: Not on file    Attends meetings of clubs or organizations: Not on file    Relationship status: Not on file  . Intimate partner violence:    Fear of current or ex partner: Not on file    Emotionally abused: Not on file    Physically abused: Not on file    Forced sexual activity: Not on file  Other Topics Concern  . Not on file  Social History Narrative   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      Home Situation: lives alone      Spiritual Beliefs: [place]      Lifestyle: no regular exercise; poor diet             Family History:    Family History  Problem Relation Age of Onset  . Asthma Father   . Heart attack Father   . Asthma Sister   . Cancer Sister        lung  . Heart attack Mother   . Stroke Brother   . Diabetes Unknown      ROS:  Please  see the history of present illness.   All other ROS reviewed and negative.     Physical Exam/Data:   Vitals:   08/11/17 0435 08/11/17 0440 08/11/17 0500 08/11/17 0800  BP: (!) 182/77 (!) 182/77 (!) 161/92 (!) 175/77  Pulse: 66  67 68  Resp: 14  11 13   Temp:    98.5 F (36.9 C)  TempSrc:    Oral  SpO2: 96%  100% 100%  Weight:      Height:        Intake/Output  Summary (Last 24 hours) at 08/11/2017 1024 Last data filed at 08/11/2017 0630 Gross per 24 hour  Intake -  Output 395 ml  Net -395 ml   Filed Weights   08/08/17 0438 08/09/17 0450 08/11/17 0323  Weight: 172 lb 9.9 oz (78.3 kg) 175 lb 0.7 oz (79.4 kg) 179 lb 0.2 oz (81.2 kg)   Body mass index is 36.16 kg/m.  General:  Ill appearing HEENT: normal Neck: +JVD Endocrine:  No thryomegaly Cardiac:  normal S1, S2; RRR; no murmur  Lungs:  Bibasilar rales, no wheezing, rhonchi or rales  Abd: tender, distended Ext: +1 dependant edema Musculoskeletal:  No deformities, BUE and BLE strength normal and equal Skin: warm and dry  Neuro:  CNs 2-12 intact, no focal abnormalities noted Psych:  Normal affect   EKG:  The EKG was personally reviewed and demonstrates:  08/09/17 reveals sinus with BiV pacing Telemetry:  Telemetry was personally reviewed and demonstrates:  sinus  Relevant CV Studies: Echo 02/24/17 reveals EF 45-50%, moderate AI, mild MR, severe LA enlargement, severe TR, PA pressure 47  Laboratory Data:  Chemistry Recent Labs  Lab 08/09/17 1848 08/10/17 0050 08/11/17 0319  NA 139 137 140  K 3.0* 3.1* 5.0  CL 102 100* 104  CO2 24 23 24   GLUCOSE 134* 126* 119*  BUN 37* 36* 40*  CREATININE 2.01* 1.97* 2.39*  CALCIUM 8.7* 8.2* 8.8*  GFRNONAA 24* 25* 20*  GFRAA 28* 29* 23*  ANIONGAP 13 14 12     Recent Labs  Lab 08/09/17 1848 08/10/17 0050  PROT 7.8 7.2  ALBUMIN 3.3* 3.1*  AST 15 14*  ALT 13* 11*  ALKPHOS 41 36*  BILITOT 0.6 0.7   Hematology Recent Labs  Lab 08/09/17 1848 08/10/17 0050  08/11/17 0319  WBC 7.2 7.5 8.7  RBC 3.75* 3.45* 3.59*  HGB 11.0* 9.9* 10.3*  HCT 32.0* 29.7* 31.5*  MCV 85.3 86.1 87.7  MCH 29.3 28.7 28.7  MCHC 34.4 33.3 32.7  RDW 15.3 15.4 16.1*  PLT 192 198 194   Cardiac Enzymes Recent Labs  Lab 08/09/17 1848 08/10/17 0050 08/10/17 0655  TROPONINI 0.08* 0.08* 0.07*   No results for input(s): TROPIPOC in the last 168 hours.  BNPNo results for input(s): BNP, PROBNP in the last 168 hours.  DDimer No results for input(s): DDIMER in the last 168 hours.  Radiology/Studies:  Dg Chest 2 View  Result Date: 08/08/2017 CLINICAL DATA:  72 year old female with shortness of breath and left upper extremity swelling. EXAM: CHEST - 2 VIEW COMPARISON:  07/31/2017 and earlier. FINDINGS: Bilateral basilar predominant increased interstitial and patchy indistinct perihilar opacity. Small bilateral pleural effusions are suspected. Lung volumes are lower than in November 2018. No pneumothorax. Stable cardiomegaly and mediastinal contours. Stable left chest cardiac AICD. No acute osseous abnormality identified. Negative visible bowel gas pattern. IMPRESSION: Acute perihilar pulmonary edema suspected with small bilateral pleural effusions. Acute viral/atypical respiratory infection felt less likely. Electronically Signed   By: Genevie Ann M.D.   On: 08/08/2017 13:23   Dg Abd Acute W/chest  Result Date: 08/09/2017 CLINICAL DATA:  Severe abdominal pain.  Abdominal distention. EXAM: DG ABDOMEN ACUTE W/ 1V CHEST COMPARISON:  Chest x-ray 08/08/2017. Abdomen series 08/06/2017. CT 08/03/2017. FINDINGS: AICD in stable position. Stable cardiomegaly with diffuse bilateral pulmonary infiltrates most consistent pulmonary edema. Bilateral pneumonia cannot be excluded. Small bilateral pleural effusions. No interim change. Persistent prominent distended loops of small bowel are noted. Relative paucity of intra colonic gas is present. Findings  consistent with small-bowel obstruction. Small-bowel  diameter up to 4.6 cm. No free air. Pelvic calcifications again noted consistent calcified fibroids as previously noted on prior CT. Degenerative changes lumbar spine and both hips. Surgical clips right quadrant. IMPRESSION: 1. AICD in stable position. Persistent changes of congestive heart failure bilateral pulmonary edema small pleural effusions. 2. Persistent changes of small-bowel obstruction. Small-bowel dilatation now measures up to 4.6 cm. Electronically Signed   By: Marcello Moores  Register   On: 08/09/2017 11:03   [name] Lt Upper Extrem Ltd Soft Tissue Non Vascular  Result Date: 08/08/2017 CLINICAL DATA:  Left antecubital fossa swelling after blood draw. EXAM: ULTRASOUND LEFT UPPER EXTREMITY LIMITED TECHNIQUE: Ultrasound examination of the upper extremity soft tissues was performed in the area of clinical concern. COMPARISON:  None. FINDINGS: There is subcutaneous edema in the area of clinical concern along the left antecubital fossa and proximal volar forearm. No discrete fluid collection. IMPRESSION: Subcutaneous edema in the left antecubital fossa and proximal volar forearm. No fluid collection. Electronically Signed   By: Titus Dubin M.D.   On: 08/08/2017 18:54    Assessment and Plan:   1. Acute decompensated combined systolic and diastolic CHF Likely exacerbated by underlying medical condition, elevated BP renal failure, holding diuretics, and IVF. Will repeat echo Increase lasix to 80mg  BID.  Consider titration of coreg once CHF is more stable for BP management.  Resume entresto/ spironolactone when ok with nephrology 2 gram sodium diet Cautious with IVF  2. Acute on chronic renal failure Previously evaluated by nephrology Initial improvements in renal function were encouraging though reduced urine output is concerning. Will ask nephrology to assist with management and timing of resumption of entresto/ spironolactone  3. Hypertensive cardrenal disease with acute renal failure and  CHF Increase coreg when CHF is stable Diuresis as above Resume other antihypertensives when able  4. Atypical atrial flutter On eliquis Continue on amiodarone Given severe LA enlargement and structural heart disease, our ability to maintain sinus rhythm longterm is reduced  5. OSA Compliance with treatment is essential for her to do well long term  Cardiology to follow with you  Very complicated patient with multiple acute medical issues.  She is at risk for decompensation.  A high level of decision making was required for this encounter.  For questions or updates, please contact [place] Please consult [email or web address] for contact info under Cardiology/STEMI.   SignedThompson Grayer, MD  08/11/2017 10:24 AM

## 2017-08-12 DIAGNOSIS — J810 Acute pulmonary edema: Secondary | ICD-10-CM

## 2017-08-12 DIAGNOSIS — I5033 Acute on chronic diastolic (congestive) heart failure: Secondary | ICD-10-CM

## 2017-08-12 DIAGNOSIS — G4733 Obstructive sleep apnea (adult) (pediatric): Secondary | ICD-10-CM

## 2017-08-12 LAB — BASIC METABOLIC PANEL
ANION GAP: 11 (ref 5–15)
BUN: 41 mg/dL — ABNORMAL HIGH (ref 6–20)
CO2: 22 mmol/L (ref 22–32)
Calcium: 8.7 mg/dL — ABNORMAL LOW (ref 8.9–10.3)
Chloride: 102 mmol/L (ref 101–111)
Creatinine, Ser: 2.31 mg/dL — ABNORMAL HIGH (ref 0.44–1.00)
GFR calc non Af Amer: 20 mL/min — ABNORMAL LOW (ref >60)
GFR, EST AFRICAN AMERICAN: 24 mL/min — AB (ref >60)
Glucose, Bld: 146 mg/dL — ABNORMAL HIGH (ref 65–99)
POTASSIUM: 4.7 mmol/L (ref 3.5–5.1)
SODIUM: 135 mmol/L (ref 135–145)

## 2017-08-12 LAB — BLOOD GAS, ARTERIAL
Acid-base deficit: 0.8 mmol/L (ref 0.0–2.0)
BICARBONATE: 24 mmol/L (ref 20.0–28.0)
Drawn by: 317771
O2 CONTENT: 3 L/min
O2 SAT: 96.5 %
PATIENT TEMPERATURE: 37
pCO2 arterial: 43.1 mmHg (ref 32.0–48.0)
pH, Arterial: 7.364 (ref 7.350–7.450)
pO2, Arterial: 79.3 mmHg — ABNORMAL LOW (ref 83.0–108.0)

## 2017-08-12 LAB — GLUCOSE, CAPILLARY
GLUCOSE-CAPILLARY: 115 mg/dL — AB (ref 65–99)
Glucose-Capillary: 166 mg/dL — ABNORMAL HIGH (ref 65–99)
Glucose-Capillary: 102 mg/dL — ABNORMAL HIGH (ref 65–99)

## 2017-08-12 MED ORDER — METOLAZONE 5 MG PO TABS
5.0000 mg | ORAL_TABLET | Freq: Two times a day (BID) | ORAL | Status: DC
  Administered 2017-08-12 – 2017-08-14 (×4): 5 mg via ORAL
  Filled 2017-08-12 (×4): qty 1

## 2017-08-12 MED ORDER — FUROSEMIDE 10 MG/ML IJ SOLN
160.0000 mg | Freq: Four times a day (QID) | INTRAVENOUS | Status: DC
  Administered 2017-08-12 – 2017-08-15 (×11): 160 mg via INTRAVENOUS
  Filled 2017-08-12 (×2): qty 16
  Filled 2017-08-12: qty 10
  Filled 2017-08-12: qty 16
  Filled 2017-08-12: qty 10
  Filled 2017-08-12: qty 16
  Filled 2017-08-12: qty 10
  Filled 2017-08-12 (×6): qty 16

## 2017-08-12 MED ORDER — NITROGLYCERIN 2 % TD OINT
1.0000 [in_us] | TOPICAL_OINTMENT | Freq: Once | TRANSDERMAL | Status: AC
  Administered 2017-08-12: 1 [in_us] via TOPICAL
  Filled 2017-08-12: qty 30

## 2017-08-12 MED ORDER — FUROSEMIDE 10 MG/ML IJ SOLN
160.0000 mg | Freq: Three times a day (TID) | INTRAVENOUS | Status: DC
  Administered 2017-08-12: 160 mg via INTRAVENOUS
  Filled 2017-08-12 (×3): qty 16

## 2017-08-12 MED ORDER — PANTOPRAZOLE SODIUM 40 MG PO TBEC
40.0000 mg | DELAYED_RELEASE_TABLET | Freq: Every day | ORAL | Status: DC
  Administered 2017-08-13 – 2017-08-17 (×5): 40 mg via ORAL
  Filled 2017-08-12 (×5): qty 1

## 2017-08-12 MED ORDER — OXYCODONE HCL 5 MG PO TABS: 5.0000 mg | ORAL_TABLET | Freq: Four times a day (QID) | ORAL | Status: DC | PRN

## 2017-08-12 MED ORDER — MORPHINE SULFATE (PF) 4 MG/ML IV SOLN: 1.0000 mg | INTRAVENOUS | Status: DC | PRN

## 2017-08-12 NOTE — Progress Notes (Signed)
ANTICOAGULATION CONSULT NOTE - Follow Up Consult  Pharmacy Consult for Eliquis Indication: hx atrial fibrillation  No Known Allergies  Patient Measurements: Height: 4\' 11"  (149.9 cm) Weight: 177 lb 4 oz (80.4 kg) IBW/kg (Calculated) : 43.2 Heparin Dosing Weight:   Vital Signs: Temp: 98.4 F (36.9 C) (04/07 0416) Temp Source: Oral (04/07 0416) BP: 140/58 (04/07 0416) Pulse Rate: 73 (04/07 0416)  Labs: Recent Labs    08/09/17 1848 08/10/17 0050 08/10/17 0655 08/11/17 0319 08/12/17 0439  HGB 11.0* 9.9*  --  10.3*  --   HCT 32.0* 29.7*  --  31.5*  --   PLT 192 198  --  194  --   CREATININE 2.01* 1.97*  --  2.39* 2.31*  TROPONINI 0.08* 0.08* 0.07*  --   --     Estimated Creatinine Clearance: 21.1 mL/min (A) (by C-G formula based on SCr of 2.31 mg/dL (H)).   Medications:  Eliquis 5 mg bid PTA  Assessment: Patient's a 72 y.o F with hx afib on Eliquis 5 mg bid PTA, presented to the ED on 3/26 with c/o abdominal pain and SOB. Patient's now on home Eliquis regimen of 5 mg bid.  Today, 08/12/2017: - scr elevated but down slightly to 2.31 (crcl~21) -- on lasix 160 mg IV q8h - cbc relatively stable - no bleeding documented - nephrology and cardiology teams seeing patient    Plan:  - continue Eliquis 5 mg bid - monitor renal function closely - monitor for s/s bleeding  Anhthu Perdew P 08/12/2017,10:18 AM

## 2017-08-12 NOTE — Progress Notes (Signed)
Progress Note   Subjective   Doing well today, the patient denies CP. SOB and abdominal discomfort are "a little better".  No new concerns  Inpatient Medications    Scheduled Meds: . amiodarone  200 mg Oral Daily  . apixaban  5 mg Oral BID  . carvedilol  12.5 mg Oral BID WC  . feeding supplement  1 Container Oral TID BM  . insulin aspart  0-5 Units Subcutaneous QHS  . insulin aspart  0-9 Units Subcutaneous TID WC  . isosorbide-hydrALAZINE  1 tablet Oral BID  . lactobacillus  1 g Oral TID WC  . levothyroxine  125 mcg Oral QAC breakfast  . metoCLOPramide (REGLAN) injection  5 mg Intravenous Q8H  . pantoprazole (PROTONIX) IV  40 mg Intravenous Q24H  . sertraline  50 mg Oral Daily   Continuous Infusions: . furosemide     PRN Meds: acetaminophen **OR** acetaminophen, albuterol, hydrALAZINE, iopamidol, menthol-cetylpyridinium, morphine injection, ondansetron (ZOFRAN) IV, oxyCODONE, phenol, promethazine, traMADol, traZODone   Vital Signs    Vitals:   08/11/17 1600 08/11/17 1945 08/11/17 2220 08/12/17 0416  BP:  (!) 156/73 128/65 (!) 140/58  Pulse:  65 64 73  Resp:  20 16 18   Temp: 97.9 F (36.6 C) 98 F (36.7 C) 98.1 F (36.7 C) 98.4 F (36.9 C)  TempSrc: Oral Oral Oral Oral  SpO2:  100% 97% 100%  Weight:  178 lb 12.7 oz (81.1 kg)  177 lb 4 oz (80.4 kg)  Height:        Intake/Output Summary (Last 24 hours) at 08/12/2017 0810 Last data filed at 08/12/2017 3329 Gross per 24 hour  Intake 364 ml  Output 420 ml  Net -56 ml   Filed Weights   08/11/17 0323 08/11/17 1945 08/12/17 0416  Weight: 179 lb 0.2 oz (81.2 kg) 178 lb 12.7 oz (81.1 kg) 177 lb 4 oz (80.4 kg)    Telemetry    Sinus with V pacing - Personally Reviewed  Physical Exam   GEN- The patient is less ill appearing, alert and oriented x 3 today.   Head- normocephalic, atraumatic Eyes-  Sclera clear, conjunctiva pink Ears- hearing intact Oropharynx- clear Neck- supple, +JVD Lungs- + bibasilar rales,  normal work of breathing Heart- Regular rate and rhythm  GI- soft, NT, ND, + BS Extremities- no clubbing, cyanosis, + dependant edema  MS- no significant deformity or atrophy Skin- no rash or lesion Psych- euthymic mood, full affect Neuro- strength and sensation are intact   Labs    Chemistry Recent Labs  Lab 08/09/17 1848 08/10/17 0050 08/11/17 0319 08/12/17 0439  NA 139 137 140 135  K 3.0* 3.1* 5.0 4.7  CL 102 100* 104 102  CO2 24 23 24 22   GLUCOSE 134* 126* 119* 146*  BUN 37* 36* 40* 41*  CREATININE 2.01* 1.97* 2.39* 2.31*  CALCIUM 8.7* 8.2* 8.8* 8.7*  PROT 7.8 7.2  --   --   ALBUMIN 3.3* 3.1*  --   --   AST 15 14*  --   --   ALT 13* 11*  --   --   ALKPHOS 41 36*  --   --   BILITOT 0.6 0.7  --   --   GFRNONAA 24* 25* 20* 20*  GFRAA 28* 29* 23* 24*  ANIONGAP 13 14 12 11      Hematology Recent Labs  Lab 08/09/17 1848 08/10/17 0050 08/11/17 0319  WBC 7.2 7.5 8.7  RBC 3.75* 3.45* 3.59*  HGB  11.0* 9.9* 10.3*  HCT 32.0* 29.7* 31.5*  MCV 85.3 86.1 87.7  MCH 29.3 28.7 28.7  MCHC 34.4 33.3 32.7  RDW 15.3 15.4 16.1*  PLT 192 198 194    Cardiac Enzymes Recent Labs  Lab 08/09/17 1848 08/10/17 0050 08/10/17 0655  TROPONINI 0.08* 0.08* 0.07*   No results for input(s): TROPIPOC in the last 168 hours.     Echo 08/11/17 reveals normalization of EF, moderate AI, moderate MR, moderate TR, PAS 49 mm Hg   Assessment & Plan    1.  Acute diastolic dysfunction EF has normalized Exacerbated by renal failure Starting to diurese a little  Continue high dose lasix as per Dr Jonnie Finner Resume spironolactone once renal function allows  2. Acute on chronic renal failure Appreciate nephrology input  3. Hypertensive cardiorenal disease with acute renal failure and CHF Will need better BP control Titrate medicines as able  4. Atypical atrial flutter Remains in sinus rhythm on amiodarone Continue eliquis  5. OSA Compliance with treatment is encouraged  Thompson Grayer  MD, [facility] 08/12/2017 8:10 AM

## 2017-08-12 NOTE — Progress Notes (Signed)
Patient Demographics:    Morgan Clay, is a 72 y.o. female, DOB - 07-07-1945, HLK:[id number]  Admit date - 07/31/2017   Admitting Physician Adam Sharene Butters, MD  Outpatient Primary MD for the patient is [name], DO  LOS - 12  Chief Complaint  Patient presents with  . Abdominal Pain  . Shortness of Breath        Subjective:    Morgan Clay today has no fevers, no emesis,     Sob is better, urine output is improving   after higher dose Lasix, son in law at bedside,   Assessment  & Plan :    Principal Problem:   Pulmonary edema, acute (HCC) Active Problems:   Hypothyroidism   Obesity   OBSTRUCTIVE SLEEP APNEA   Type 2 diabetes, controlled, with renal manifestation (HCC)   Chronic kidney disease - followed by [facility]   Chronic obstructive pulmonary disease ([place])   Hypertension associated with diabetes ([place])   Persistent atrial fibrillation (HCC)   Diverticulitis   ARF (acute renal failure) (HCC)   Enteritis   Partial small bowel obstruction (HCC)   Pain and swelling of left upper extremity   Brief Narrative: Morgan Clay a 72 y.o.femalewith medical history significantfor NICM (last EF 45%) s/pCRT-D, A. fib on Eliquis with recent DCCV on 07/24/17, OSA on CPAP, HCV status post Harvoni (viremic response not documented), hypothyroidism, depression and diet managed type 2 diabetes mellitus who presents to the ED with 5 days of diffuse, generalized abdominal pain, with associated anorexia, nausea and one episode of profuse nonbloody, nonbilious emesis on the day of presentation.  CT abdomen and pelvis without contrast showed Suspected mild acute diverticulitis involving the proximal sigmoid colon. Scattered small bowel air-fluid levels and with questionable wall thickening of a few left-sided small bowel loops, query enteritis.  Admitted on 07/31/2017 with the above  findings with concerns about possible partial SBO versus diverticulitis versus colitis as well as concerns about possible pneumonia.  Clinically and radiologically patient found to be in pulmonary edema, responded well to high-dose Lasix    Plan:- 1)Acute Sigmoid Diverticulitis Vs Colitis--tolerating GI soft diet well, having bowel movements, No further fevers,  She has completed 7 days of IV zosyn in addition to 2 days of Unasyn.    Surgical consult appreciated, surgery recommended discontinuation of antibiotics, surgical service signed off as of 08/09/2017.  Abdominal imaging studies continues to suggest possible small bowel obstruction, however clinically patient is tolerating oral intake and having bowel movements   2)AKI----acute kidney injury on CKD stage III -     creatinine on admission= 2.1 ,   baseline creatinine = 1.8   , creatinine is now= 2.31   , creatinine peaked at 3.77,  Avoid nephrotoxic agents/dehydration/hypotension, nephrology consult appreciated, patient was treated with bicarb drip  3)H/o HFpEF-- developed acute on chronic combined systolic/diastolic CHF exacerbation on 08/09/17, she has history of chronic combined diastolic and systolic dysfunction CHF, echo from 02/24/2017 showed EF of 45-50% (echo from 08/11/17 now suggest EF of 60-65% with grade 2 diastolic dysfunction CHF) patient was on Entresto, Aldactone prior to admission, both are currently on hold, discussed with Dr. Rayann Heman from cardiology service on 08/11/2017 also discussed with Dr. Jonnie Finner from nephrology  service on 08/11/2017, will increase Lasix further to 160 mg IV q 8 hrs to achieve better diuresis,  cardiology has requested nephrology's input with regards to when Delene Loll can be restarted, continue isosorbide/hydralazine (Bidil) combo, continue Coreg 12.5 mg twice daily, follow daily weight and fluid input and output chart  4)Chronic atrial fibrillation rate controlled -continue amiodarone and Coreg for rate control,  restarted Eliquis on 08/09/17  5)Hypothyroidism----  resume Synthroid.  6)Disposition /Generalized weakness and debility-dyspnea on exertion, hypoxia, given further decompensation and generalized weakness and debility patient and daughter are now more agreeable to skilled nursing facility rehab post acute issues  7)History of hepatitis C status post Harwani.  Outpatient follow-up with PCP.[name]  8)Diabetes mellitus-last A1c 6.8 reflecting good control, Use Novolog/Humalog Sliding scale insulin with Accu-Cheks/Fingersticks as ordered   9) mild normocytic anemia-may be hemodilution, baseline hemoglobin usually around 11 hemoglobin currently above 10, no evidence of bleeding watch closely  10)Chest Pain-resolved, troponins noted, EKG with paced rhythm,   DVT prophylaxis: ELIQUIS   Code Status: Full code Family Communication: daughter [name]  Disposition Plan:  SNF Rehab Consultants:   Surgery.   Nephrology Cardiology  Procedures: None  Antimicrobials: completed zosyn/unasyn   DVT Prophylaxis  :  Eliquis  Lab Results  Component Value Date   PLT 194 08/11/2017    Inpatient Medications  Scheduled Meds: . amiodarone  200 mg Oral Daily  . apixaban  5 mg Oral BID  . carvedilol  12.5 mg Oral BID WC  . feeding supplement  1 Container Oral TID BM  . insulin aspart  0-5 Units Subcutaneous QHS  . insulin aspart  0-9 Units Subcutaneous TID WC  . isosorbide-hydrALAZINE  1 tablet Oral BID  . lactobacillus  1 g Oral TID WC  . levothyroxine  125 mcg Oral QAC breakfast  . metoCLOPramide (REGLAN) injection  5 mg Intravenous Q8H  . metolazone  5 mg Oral BID  . [START ON 08/13/2017] pantoprazole  40 mg Oral Daily  . sertraline  50 mg Oral Daily   Continuous Infusions: . furosemide 160 mg (08/12/17 1319)   PRN Meds:.acetaminophen **OR** acetaminophen, albuterol, hydrALAZINE, iopamidol, menthol-cetylpyridinium, morphine injection, ondansetron (ZOFRAN) IV, oxyCODONE, phenol,  promethazine, traMADol, traZODone    Anti-infectives (From admission, onward)   Start     Dose/Rate Route Frequency Ordered Stop   08/07/17 2000  piperacillin-tazobactam (ZOSYN) IVPB 3.375 g     3.375 g 12.5 mL/hr over 240 Minutes Intravenous Every 8 hours 08/07/17 1227 08/10/17 0250   08/02/17 1800  piperacillin-tazobactam (ZOSYN) IVPB 2.25 g  Status:  Discontinued     2.25 g 100 mL/hr over 30 Minutes Intravenous Every 6 hours 08/02/17 1737 08/07/17 1226   07/31/17 2200  Ampicillin-Sulbactam (UNASYN) 3 g in sodium chloride 0.9 % 100 mL IVPB  Status:  Discontinued     3 g 200 mL/hr over 30 Minutes Intravenous Every 12 hours 07/31/17 2132 08/02/17 1708   07/31/17 1830  cefTRIAXone (ROCEPHIN) 2 g in sodium chloride 0.9 % 100 mL IVPB     2 g 200 mL/hr over 30 Minutes Intravenous  Once 07/31/17 1822 07/31/17 1919   07/31/17 1830  metroNIDAZOLE (FLAGYL) IVPB 500 mg  Status:  Discontinued     500 mg 100 mL/hr over 60 Minutes Intravenous  Once 07/31/17 1822 07/31/17 2053   07/31/17 1830  doxycycline (VIBRAMYCIN) 100 mg in sodium chloride 0.9 % 250 mL IVPB  Status:  Discontinued     100 mg 125 mL/hr over 120 Minutes Intravenous  Once 07/31/17 1822 07/31/17 2053        Objective:   Vitals:   08/11/17 2220 08/12/17 0416 08/12/17 1332 08/12/17 1357  BP: 128/65 (!) 140/58  (!) 156/65  Pulse: 64 73  74  Resp: 16 18  (!) 24  Temp: 98.1 F (36.7 C) 98.4 F (36.9 C)  98.7 F (37.1 C)  TempSrc: Oral Oral  Oral  SpO2: 97% 100% 100% 99%  Weight:  80.4 kg (177 lb 4 oz)    Height:        Wt Readings from Last 3 Encounters:  08/12/17 80.4 kg (177 lb 4 oz)  07/19/17 68.5 kg (151 lb)  06/26/17 68 kg (150 lb)     Intake/Output Summary (Last 24 hours) at 08/12/2017 1703 Last data filed at 08/12/2017 1319 Gross per 24 hour  Intake 364 ml  Output 845 ml  Net -481 ml     Physical Exam  Gen:- Awake Alert,  In no apparent distress  HEENT:- [place].AT, No sclera icterus Nose- [place] 3  L/min Neck-Supple Neck,  Lungs-diminished in bases, faint bibasilar rales, no wheezing CV- S1, S2 normal, irregularly irregular, AICD in situ Abd-  +ve B.Sounds, Abd Soft, No tenderness,    Extremity/Skin:-Good pulses,  skin is warm and dry, improved lower extremity edema, left upper extremity swelling noted psych-affect is appropriate, oriented x3 Neuro-generalized weakness without any new focal deficits, no tremors GU-Foley with clear urine  Data Review:   Micro Results Recent Results (from the past 240 hour(s))  C difficile quick scan w PCR reflex     Status: None   Collection Time: 08/06/17 10:16 AM  Result Value Ref Range Status   C Diff antigen NEGATIVE NEGATIVE Final   C Diff toxin NEGATIVE NEGATIVE Final   C Diff interpretation No C. difficile detected.  Final    Comment: Performed at [facility], [place] [address] [place], [place] [address]  MRSA PCR Screening     Status: None   Collection Time: 08/09/17  8:00 PM  Result Value Ref Range Status   MRSA by PCR NEGATIVE NEGATIVE Final    Comment:        The GeneXpert MRSA Assay (FDA approved for NASAL specimens only), is one component of a comprehensive MRSA colonization surveillance program. It is not intended to diagnose MRSA infection nor to guide or monitor treatment for MRSA infections. Performed at [facility], [place] [address], [place], [place] [address]     Radiology Reports Ct Abdomen Pelvis Wo Contrast  Result Date: 08/03/2017 CLINICAL DATA:  Lower abdominal pain. EXAM: CT ABDOMEN AND PELVIS WITHOUT CONTRAST TECHNIQUE: Multidetector CT imaging of the abdomen and pelvis was performed following the standard protocol without IV contrast. COMPARISON:  CT scan of July 31, 2017. FINDINGS: Lower chest: Mild bibasilar subsegmental atelectasis or pneumonia is noted. Hepatobiliary: No gallstones are noted. No focal hepatic lesion is noted. Mildly nodular hepatic margins are noted  suggesting cirrhosis. Pancreas: Unremarkable. No pancreatic ductal dilatation or surrounding inflammatory changes. Spleen: Normal in size without focal abnormality. Adrenals/Urinary Tract: Adrenal glands are unremarkable. Kidneys are normal, without renal calculi, focal lesion, or hydronephrosis. Bladder is unremarkable. Stomach/Bowel: The stomach appears normal. Moderate small bowel dilatation is noted concerning for ileus or distal small bowel obstruction. Proximal sigmoid diverticulitis is again noted. Vascular/Lymphatic: Aortic atherosclerosis. No enlarged abdominal or pelvic lymph nodes. Reproductive: Large calcified uterine fibroid is noted. No adnexal abnormality is noted. Other: No abnormal fluid collection is noted. Mild  fluid-filled left inguinal hernia is noted. Musculoskeletal: No acute or significant osseous findings. IMPRESSION: Interval development of moderate small bowel dilatation is noted concerning for ileus or distal small bowel obstruction. Proximal sigmoid diverticulitis is again noted. Mildly nodular hepatic margins are noted suggesting hepatic cirrhosis. Large calcified uterine fibroid is noted. Mild bibasilar subsegmental atelectasis or pneumonia is noted. Aortic Atherosclerosis (ICD10-I70.0). Electronically Signed   By: Marijo Conception, M.D.   On: 08/03/2017 17:16   Ct Abdomen Pelvis Wo Contrast  Result Date: 07/31/2017 CLINICAL DATA:  Abdominal pain, distention, nausea, and vomiting. EXAM: CT ABDOMEN AND PELVIS WITHOUT CONTRAST TECHNIQUE: Multidetector CT imaging of the abdomen and pelvis was performed following the standard protocol without IV contrast. COMPARISON:  Right upper quadrant abdominal ultrasound 02/23/2014. Renal ultrasound 10/01/2013. Abdominal ultrasound 04/06/2011. FINDINGS: Lower chest: Patchy consolidation in both lower lobes, incompletely imaged. No pleural effusion. Partially visualized ICD leads and cardiomegaly. Hepatobiliary: Slightly nodular liver contour  without focal abnormality identified on this unenhanced study. The layering hyperdense material in the gallbladder lumen without gross wall thickening or pericholecystic inflammation. No biliary dilatation. Pancreas: Unremarkable. Spleen: Unremarkable. Adrenals/Urinary Tract: Unremarkable adrenal glands. No evidence of renal mass, calculi, or hydronephrosis. Mildly distended urinary bladder without evidence of wall thickening. Stomach/Bowel: The stomach is within normal limits. Scattered diverticula are noted in the distal descending and sigmoid colon, and there is the suggestion of mild wall thickening of the proximal sigmoid colon with mild surrounding fat stranding. Scattered air-fluid levels are present in multiple small bowel loops without bowel dilatation suggestive of a significant mechanical obstruction. There is a question of mild wall thickening involving a few small bowel loops in the left mid to lower abdomen. Prior appendectomy. Vascular/Lymphatic: Abdominal aortic atherosclerosis without aneurysm. No enlarged lymph nodes. Reproductive: Multiple calcified uterine fibroids including a 6 cm anterior fibroid resulting in impression on the bladder. No adnexal mass. Other: No intraperitoneal free fluid. Mild nonspecific haziness throughout the mesenteric fat. No abdominal wall hernia. Musculoskeletal: Lower lumbar disc bulging and moderate facet arthrosis. IMPRESSION: 1. Suspected mild acute diverticulitis involving the proximal sigmoid colon. 2. Scattered small bowel air-fluid levels and with questionable wall thickening of a few left-sided small bowel loops, query enteritis. 3. Bilateral lower lobe lung opacities compatible with pneumonia. 4. Slightly nodular contour of the liver which could reflect mild changes of cirrhosis. 5. Layering density in the gallbladder lumen may represent concentrated bile and/or small stones. 6. Fibroid uterus. Electronically Signed   By: Logan Bores M.D.   On: 07/31/2017  18:17   Dg Chest 2 View  Result Date: 08/08/2017 CLINICAL DATA:  72 year old female with shortness of breath and left upper extremity swelling. EXAM: CHEST - 2 VIEW COMPARISON:  07/31/2017 and earlier. FINDINGS: Bilateral basilar predominant increased interstitial and patchy indistinct perihilar opacity. Small bilateral pleural effusions are suspected. Lung volumes are lower than in November 2018. No pneumothorax. Stable cardiomegaly and mediastinal contours. Stable left chest cardiac AICD. No acute osseous abnormality identified. Negative visible bowel gas pattern. IMPRESSION: Acute perihilar pulmonary edema suspected with small bilateral pleural effusions. Acute viral/atypical respiratory infection felt less likely. Electronically Signed   By: Genevie Ann M.D.   On: 08/08/2017 13:23   Dg Chest 2 View  Result Date: 07/31/2017 CLINICAL DATA:  Chest pain short of breath EXAM: CHEST - 2 VIEW COMPARISON:  03/22/2017 FINDINGS: Hypoventilation with bibasilar atelectasis/infiltrate. Negative for heart failure or effusion. AICD unchanged in position IMPRESSION: Hypoventilation with bibasilar atelectasis/infiltrate. Electronically Signed   By: Juanda Crumble  Carlis Abbott M.D.   On: 07/31/2017 13:04   US Renal  Result Date: 08/03/2017 CLINICAL DATA:  Acute renal failure, history hypertension, hyperlipidemia, CHF, non ischemic cardiomyopathy EXAM: RENAL / URINARY TRACT ULTRASOUND COMPLETE COMPARISON:  CT abdomen and pelvis 07/31/2017 FINDINGS: Right Kidney: Length: 9.5 cm. Normal cortical thickness. Increased cortical echogenicity. No mass, hydronephrosis, or or shadowing calcification. Left Kidney: Length: 10.6 cm. Normal cortical thickness. Increased cortical echogenicity. No mass, hydronephrosis, or or shadowing calcification. Bladder: Appears normal for degree of bladder distention. BILATERAL ureteral jets noted. IMPRESSION: Medical renal disease changes of the kidneys. Electronically Signed   By: Lavonia [name] M.D.   On:  08/03/2017 15:58   Dg Abd 2 Views  Result Date: 08/06/2017 CLINICAL DATA:  Small-bowel obstruction. History of an appendectomy. EXAM: ABDOMEN - 2 VIEW COMPARISON:  CT, 08/03/2017 FINDINGS: There are dilated loops of small bowel with no colonic distention, similar to the prior CT. Air-fluid levels are noted on the decubitus view. No free air. Calcified fibroid is noted in the central pelvis. Surgical clips the right lower quadrant reflect prior appendectomy. IMPRESSION: 1. Persistent partial small bowel obstruction. No significant change from the prior CT scan. No free air. Electronically Signed   By: Lajean Manes M.D.   On: 08/06/2017 09:22   Dg Abd Acute W/chest  Result Date: 08/11/2017 CLINICAL DATA:  72 year old female with cough, syncope, dyspnea and abdominal distension EXAM: DG ABDOMEN ACUTE W/ 1V CHEST COMPARISON:  Prior acute abdominal series 08/09/2017 FINDINGS: Left subclavian approach biventricular cardiac rhythm maintenance device remains in unchanged position with leads projecting over the right atrium, right ventricle and overlying the left ventricle. Stable cardiomegaly. Atherosclerotic calcifications again noted in the transverse aorta. Slight interval increase in pulmonary vascular congestion now with mild edema. Interval development of small bilateral pleural effusions. No evidence of free air on the semi-erect or lateral decubitus views. Nonspecific bowel gas pattern with gas in multiple loops of small bowel. There does appear to be diffuse mild small bowel wall thickening with thickening of the valvulae. Dystrophic calcified uterine fibroid. No acute osseous abnormality. IMPRESSION: 1. Developing CHF with increased pulmonary vascular congestion, mild pulmonary edema and new small bilateral pleural effusions. 2. Mild gaseous distention of several loops of small bowel in the mid abdomen with evidence of submucosal thickening/thickening of the valvulae. Differential considerations are broad,  but most commonly this represents either infectious or inflammatory enteritis. 3. Cardiomegaly. 4.  Aortic Atherosclerosis (ICD10-170.0) Electronically Signed   By: Jacqulynn Cadet M.D.   On: 08/11/2017 13:15   Dg Abd Acute W/chest  Result Date: 08/09/2017 CLINICAL DATA:  Severe abdominal pain.  Abdominal distention. EXAM: DG ABDOMEN ACUTE W/ 1V CHEST COMPARISON:  Chest x-ray 08/08/2017. Abdomen series 08/06/2017. CT 08/03/2017. FINDINGS: AICD in stable position. Stable cardiomegaly with diffuse bilateral pulmonary infiltrates most consistent pulmonary edema. Bilateral pneumonia cannot be excluded. Small bilateral pleural effusions. No interim change. Persistent prominent distended loops of small bowel are noted. Relative paucity of intra colonic gas is present. Findings consistent with small-bowel obstruction. Small-bowel diameter up to 4.6 cm. No free air. Pelvic calcifications again noted consistent calcified fibroids as previously noted on prior CT. Degenerative changes lumbar spine and both hips. Surgical clips right quadrant. IMPRESSION: 1. AICD in stable position. Persistent changes of congestive heart failure bilateral pulmonary edema small pleural effusions. 2. Persistent changes of small-bowel obstruction. Small-bowel dilatation now measures up to 4.6 cm. Electronically Signed   By: Marcello Moores  Register   On: 08/09/2017 11:03  [name] Soft Tissue Non Vascular  Result Date: 08/08/2017 CLINICAL DATA:  Left antecubital fossa swelling after blood draw. EXAM: ULTRASOUND LEFT UPPER EXTREMITY LIMITED TECHNIQUE: Ultrasound examination of the upper extremity soft tissues was performed in the area of clinical concern. COMPARISON:  None. FINDINGS: There is subcutaneous edema in the area of clinical concern along the left antecubital fossa and proximal volar forearm. No discrete fluid collection. IMPRESSION: Subcutaneous edema in the left antecubital fossa and proximal volar forearm. No fluid  collection. Electronically Signed   By: Titus Dubin M.D.   On: 08/08/2017 18:54     CBC Recent Labs  Lab 08/07/17 0411 08/09/17 1848 08/10/17 0050 08/11/17 0319  WBC 8.2 7.2 7.5 8.7  HGB 10.6* 11.0* 9.9* 10.3*  HCT 32.9* 32.0* 29.7* 31.5*  PLT 184 192 198 194  MCV 88.9 85.3 86.1 87.7  MCH 28.6 29.3 28.7 28.7  MCHC 32.2 34.4 33.3 32.7  RDW 15.8* 15.3 15.4 16.1*   Chemistries  Recent Labs  Lab 08/09/17 0339 08/09/17 1848 08/10/17 0050 08/10/17 0655 08/11/17 0319 08/12/17 0439  NA 138 139 137  --  140 135  K 3.2* 3.0* 3.1*  --  5.0 4.7  CL 103 102 100*  --  104 102  CO2 21* 24 23  --  24 22  GLUCOSE 118* 134* 126*  --  119* 146*  BUN 39* 37* 36*  --  40* 41*  CREATININE 2.11* 2.01* 1.97*  --  2.39* 2.31*  CALCIUM 8.6* 8.7* 8.2*  --  8.8* 8.7*  MG  --   --   --  1.8  --   --   AST  --  15 14*  --   --   --   ALT  --  13* 11*  --   --   --   ALKPHOS  --  41 36*  --   --   --   BILITOT  --  0.6 0.7  --   --   --    ------------------------------------------------------------------------------------------------------------------ No results for input(s): CHOL, HDL, LDLCALC, TRIG, CHOLHDL, LDLDIRECT in the last 72 hours.  Lab Results  Component Value Date   HGBA1C 6.8 (H) 06/07/2017   ------------------------------------------------------------------------------------------------------------------ No results for input(s): TSH, T4TOTAL, T3FREE, THYROIDAB in the last 72 hours.  Invalid input(s): FREET3 ------------------------------------------------------------------------------------------------------------------ No results for input(s): VITAMINB12, FOLATE, FERRITIN, TIBC, IRON, RETICCTPCT in the last 72 hours.  Coagulation profile No results for input(s): INR, PROTIME in the last 168 hours.  No results for input(s): DDIMER in the last 72 hours.  Cardiac Enzymes Recent Labs  Lab 08/09/17 1848 08/10/17 0050 08/10/17 0655  TROPONINI 0.08* 0.08* 0.07*    ------------------------------------------------------------------------------------------------------------------    Component Value Date/Time   BNP 547.9 (H) 07/31/2017 1229   Roxan Hockey M.D on 08/12/2017 at 5:03 PM  Between 7am to 7pm - Pager - [phone]  After 7pm go to [email or web address] - password [email or web address]  [facility] -  Office  [phone]  Voice Recognition Viviann Spare dictation system was used to create this note, attempts have been made to correct errors. Please contact the author with questions and/or clarifications.

## 2017-08-12 NOTE — Progress Notes (Addendum)
[facility] Progress Note  Subjective: starting to produce more UOP, SOB no better no worse today, according to the daughter she is getting "too much morphine" and is hallucinating.     Vitals:   08/11/17 2220 08/12/17 0416 08/12/17 1332 08/12/17 1357  BP: 128/65 (!) 140/58  (!) 156/65  Pulse: 64 73  74  Resp: 16 18  (!) 24  Temp: 98.1 F (36.7 C) 98.4 F (36.9 C)  98.7 F (37.1 C)  TempSrc: Oral Oral  Oral  SpO2: 97% 100% 100% 99%  Weight:  80.4 kg (177 lb 4 oz)    Height:        Inpatient medications: . amiodarone  200 mg Oral Daily  . apixaban  5 mg Oral BID  . carvedilol  12.5 mg Oral BID WC  . feeding supplement  1 Container Oral TID BM  . insulin aspart  0-5 Units Subcutaneous QHS  . insulin aspart  0-9 Units Subcutaneous TID WC  . isosorbide-hydrALAZINE  1 tablet Oral BID  . lactobacillus  1 g Oral TID WC  . levothyroxine  125 mcg Oral QAC breakfast  . metoCLOPramide (REGLAN) injection  5 mg Intravenous Q8H  . metolazone  5 mg Oral BID  . [START ON 08/13/2017] pantoprazole  40 mg Oral Daily  . sertraline  50 mg Oral Daily   . furosemide Stopped (08/12/17 1420)   acetaminophen **OR** acetaminophen, albuterol, hydrALAZINE, iopamidol, menthol-cetylpyridinium, morphine injection, ondansetron (ZOFRAN) IV, oxyCODONE, phenol, promethazine, traMADol, traZODone  Exam: Alert, nasal O2, slurred speech and asterixis mild +jvd Chest bilat rales 1/3 up, no ^wob Cor RRR Abd tense, nontender, +bs Ext diffuse moderate pitting edema bilat LE's up to hips +UE edema L > R facial edema NF, ox 3  CXR 4/3 worsening pulm edema   Impression: 1  AKI/ CKD3 - baseline creat 1.8, low 2's now, would not worry about creat at this point, needs aggressive diuresis for gross vol overload, have d/w primary 2  Vol overload / pulm edema/ decomp CHF - sig wt increase, poor UOP , diffuse edema, hx HFpEF; max lasix now IV q 6 hrs, have added zaroxolyn too, starting to diurese 3   Diverticulitis - sp 7 days iv abx 4  H/o HFpEF - as above 5  Chronic afib 6  DM on insulin 7  Pain - she is having hallucinations/ slurred speech and asterixis, have dc'd all narcotics due to narcotic excess, use ultram and tylenol for the next 48 hrs, at high risk for resp failure w/ her active vol overload  8  Anemia - stable 9  HTN on bidil/ coreg, lasix  Plan - as above   Kelly Splinter MD [name] pager [phone]   08/12/2017, 6:50 PM   Recent Labs  Lab 08/10/17 0050 08/11/17 0319 08/12/17 0439  NA 137 140 135  K 3.1* 5.0 4.7  CL 100* 104 102  CO2 23 24 22   GLUCOSE 126* 119* 146*  BUN 36* 40* 41*  CREATININE 1.97* 2.39* 2.31*  CALCIUM 8.2* 8.8* 8.7*   Recent Labs  Lab 08/09/17 1848 08/10/17 0050  AST 15 14*  ALT 13* 11*  ALKPHOS 41 36*  BILITOT 0.6 0.7  PROT 7.8 7.2  ALBUMIN 3.3* 3.1*   Recent Labs  Lab 08/09/17 1848 08/10/17 0050 08/11/17 0319  WBC 7.2 7.5 8.7  HGB 11.0* 9.9* 10.3*  HCT 32.0* 29.7* 31.5*  MCV 85.3 86.1 87.7  PLT 192 198 194   Iron/TIBC/Ferritin/ %Sat  No results found for: IRON, TIBC, FERRITIN, IRONPCTSAT

## 2017-08-13 ENCOUNTER — Inpatient Hospital Stay (HOSPITAL_COMMUNITY): Payer: Medicare Other

## 2017-08-13 ENCOUNTER — Inpatient Hospital Stay (HOSPITAL_COMMUNITY): Admission: RE | Admit: 2017-08-13 | Payer: Medicare Other | Source: Ambulatory Visit | Admitting: Nurse Practitioner

## 2017-08-13 DIAGNOSIS — I10 Essential (primary) hypertension: Secondary | ICD-10-CM

## 2017-08-13 DIAGNOSIS — N170 Acute kidney failure with tubular necrosis: Secondary | ICD-10-CM

## 2017-08-13 DIAGNOSIS — I481 Persistent atrial fibrillation: Secondary | ICD-10-CM

## 2017-08-13 DIAGNOSIS — E1159 Type 2 diabetes mellitus with other circulatory complications: Secondary | ICD-10-CM

## 2017-08-13 LAB — BASIC METABOLIC PANEL
ANION GAP: 14 (ref 5–15)
BUN: 38 mg/dL — AB (ref 6–20)
CHLORIDE: 102 mmol/L (ref 101–111)
CO2: 23 mmol/L (ref 22–32)
Calcium: 9.1 mg/dL (ref 8.9–10.3)
Creatinine, Ser: 1.98 mg/dL — ABNORMAL HIGH (ref 0.44–1.00)
GFR calc Af Amer: 28 mL/min — ABNORMAL LOW (ref >60)
GFR calc non Af Amer: 25 mL/min — ABNORMAL LOW (ref >60)
GLUCOSE: 113 mg/dL — AB (ref 65–99)
Potassium: 4.3 mmol/L (ref 3.5–5.1)
SODIUM: 139 mmol/L (ref 135–145)

## 2017-08-13 LAB — CBC
HCT: 30.1 % — ABNORMAL LOW (ref 36.0–46.0)
HEMOGLOBIN: 9.7 g/dL — AB (ref 12.0–15.0)
MCH: 28.7 pg (ref 26.0–34.0)
MCHC: 32.2 g/dL (ref 30.0–36.0)
MCV: 89.1 fL (ref 78.0–100.0)
Platelets: 184 10*3/uL (ref 150–400)
RBC: 3.38 MIL/uL — AB (ref 3.87–5.11)
RDW: 16.3 % — ABNORMAL HIGH (ref 11.5–15.5)
WBC: 9.9 10*3/uL (ref 4.0–10.5)

## 2017-08-13 LAB — GLUCOSE, CAPILLARY
GLUCOSE-CAPILLARY: 110 mg/dL — AB (ref 65–99)
GLUCOSE-CAPILLARY: 124 mg/dL — AB (ref 65–99)
GLUCOSE-CAPILLARY: 217 mg/dL — AB (ref 65–99)
GLUCOSE-CAPILLARY: 91 mg/dL (ref 65–99)
Glucose-Capillary: 144 mg/dL — ABNORMAL HIGH (ref 65–99)

## 2017-08-13 MED ORDER — CARVEDILOL 25 MG PO TABS
25.0000 mg | ORAL_TABLET | Freq: Two times a day (BID) | ORAL | Status: DC
  Administered 2017-08-13 – 2017-08-14 (×2): 25 mg via ORAL
  Filled 2017-08-13 (×2): qty 1

## 2017-08-13 NOTE — Progress Notes (Signed)
[facility] Progress Note  Subjective: 3614 UOP- crt down, SOB better , weight down- MS better today as well    Vitals:   08/13/17 0433 08/13/17 0533 08/13/17 0855 08/13/17 1000  BP: (!) 166/60  (!) 186/75 (!) 150/68  Pulse: 83   80  Resp: (!) 24     Temp: 98.8 F (37.1 C)     TempSrc: Oral     SpO2: 100% 96%    Weight: 78.2 kg (172 lb 6.4 oz)     Height:        Inpatient medications: . amiodarone  200 mg Oral Daily  . apixaban  5 mg Oral BID  . carvedilol  12.5 mg Oral BID WC  . feeding supplement  1 Container Oral TID BM  . insulin aspart  0-5 Units Subcutaneous QHS  . insulin aspart  0-9 Units Subcutaneous TID WC  . isosorbide-hydrALAZINE  1 tablet Oral BID  . lactobacillus  1 g Oral TID WC  . levothyroxine  125 mcg Oral QAC breakfast  . metoCLOPramide (REGLAN) injection  5 mg Intravenous Q8H  . metolazone  5 mg Oral BID  . pantoprazole  40 mg Oral Daily  . sertraline  50 mg Oral Daily   . furosemide 160 mg (08/13/17 0830)   acetaminophen **OR** acetaminophen, albuterol, hydrALAZINE, iopamidol, menthol-cetylpyridinium, ondansetron (ZOFRAN) IV, phenol, promethazine, traMADol, traZODone  Exam: Alert, nasal O2, slurred speech and asterixis mild +jvd Chest bilat rales 1/3 up, no ^wob Cor RRR Abd tense, nontender, +bs Ext diffuse moderate pitting edema bilat LE's up to hips +UE edema L > R facial edema NF, ox 3  CXR 4/3 worsening pulm edema   Impression: 1  AKI/ CKD3 - baseline creat 1.8, low 2's now, would not worry about creat at this point but is going down and is at baseline, needs aggressive diuresis for gross vol overload, have d/w primary 2  Vol overload / pulm edema/ decomp CHF - sig wt increase, poor UOP , diffuse edema, hx HFpEF; on lasix 160 q 6 and metolazone 5 BID- better UOP- cont same meds for now.  Also on coreg and bidil, BP if anything high 3  Diverticulitis - sp 7 days iv abx 4  H/o HFpEF - as above 5  Chronic afib 6  DM on  insulin 7  Pain - she was having hallucinations/ slurred speech and asterixis, dc'd all narcotics use ultram and tylenol for the next 48 hrs, much better MS 8  Anemia - stable 9  HTN on bidil/ coreg, lasix     Morgan Clay  08/13/2017, 12:03 PM   Recent Labs  Lab 08/11/17 0319 08/12/17 0439 08/13/17 0435  NA 140 135 139  K 5.0 4.7 4.3  CL 104 102 102  CO2 24 22 23   GLUCOSE 119* 146* 113*  BUN 40* 41* 38*  CREATININE 2.39* 2.31* 1.98*  CALCIUM 8.8* 8.7* 9.1   Recent Labs  Lab 08/09/17 1848 08/10/17 0050  AST 15 14*  ALT 13* 11*  ALKPHOS 41 36*  BILITOT 0.6 0.7  PROT 7.8 7.2  ALBUMIN 3.3* 3.1*   Recent Labs  Lab 08/10/17 0050 08/11/17 0319 08/13/17 0435  WBC 7.5 8.7 9.9  HGB 9.9* 10.3* 9.7*  HCT 29.7* 31.5* 30.1*  MCV 86.1 87.7 89.1  PLT 198 194 184   Iron/TIBC/Ferritin/ %Sat No results found for: IRON, TIBC, FERRITIN, IRONPCTSAT

## 2017-08-13 NOTE — Evaluation (Signed)
Clinical/Bedside Swallow Evaluation Patient Details  Name: Morgan Clay MRN: [id number] Date of Birth: [birth year]  Today's Date: 08/13/2017 Time: SLP Start Time (ACUTE ONLY): 1330 SLP Stop Time (ACUTE ONLY): 1403 SLP Time Calculation (min) (ACUTE ONLY): 33 min  Past Medical History:  Past Medical History:  Diagnosis Date  . Arthritis   . Asthma    reports mild asthma since childhood - had COPD on dx list from prior PCP  . Atypical atrial flutter ([place]) 09/28/2015  . Chronic systolic congestive heart failure ([place])   . DEPRESSION 12/17/2009   Annotation: PHQ-9 score = 14 done on 12/17/2009 Qualifier: Diagnosis of  By: Hassell Done FNP, Tori Milks    . FIBROIDS, UTERUS 03/05/2008  . Glaucoma   . HEPATITIS C - s/p treatment with Harvoni, seeing hepatology, Dawn Drazek 01/04/2007  . Hyperlipemia 12/06/2012  . HYPERTENSION, BENIGN 04/24/2007  . Hypothyroidism   . LBBB (left bundle branch block)   . Nonischemic cardiomyopathy ([place])   . OBESITY 05/27/2009  . OBSTRUCTIVE SLEEP APNEA 11/14/2007   npsg 2009:  Mild osa with AHI 11/hr. Started cpap 2009, quit using due to lack of response Home sleep testing 09/2010 with AHI only 7/hr and weight loss advised by pulmonology.  . OSTEOPENIA 09/30/2008  . Personal history of other infectious and parasitic disease    Hepatitis B   Past Surgical History:  Past Surgical History:  Procedure Laterality Date  . appendectomy    . CARDIAC CATHETERIZATION    . CARDIOVERSION N/A 12/14/2014   Procedure: CARDIOVERSION;  Surgeon: Dorothy Spark, MD;  Location: [facility] ENDOSCOPY;  Service: Cardiovascular;  Laterality: N/A;  . CARDIOVERSION N/A 02/26/2017   Procedure: CARDIOVERSION;  Surgeon: Evans Lance, MD;  Location: [facility] CV LAB;  Service: Cardiovascular;  Laterality: N/A;  . CARDIOVERSION N/A 07/24/2017   Procedure: CARDIOVERSION;  Surgeon: Thayer Headings, MD;  Location: [facility] ENDOSCOPY;  Service: Cardiovascular;  Laterality: N/A;  . EP IMPLANTABLE DEVICE N/A  10/06/2015   Procedure: BIV ICD Generator Changeout;  Surgeon: [name], MD;  Location: [facility] CV LAB;  Service: Cardiovascular;  Laterality: N/A;   HPI:  72 yo female adm to [facility] with dyspnea - found to have pulmonary edmea.  PMH + for partial SBO, ARF, pulmonary edema, obesity, Afib, diverticulitis, DM2, OSA, enteritis.  Pt has degenerative spondylosis at C5-C6 and CXR indicates developing CHF.   Per RN, pt with delay in swallow initiation and oral holding.  Pt denies h/o dysphagia - and states she has been informed that she has reflux during this hospital stay.  Swallow evaluation ordered.     Assessment / Plan / Recommendation Clinical Impression  CN exam unremarkable but pt did not pass [place] 3 ounce water test as she required rest break due to dyspnea.   Pt with viscous secretions in oral cavity - which with cues she cleared.  Pt did report sensation of secretions in pharynx which she cleared with cough - viscous secretions expectorated.  Cough at baseline and with intake observed therefore difficult to elucidate aspiration.  Suspect pt's largest issue is her reflux - Educated pt to aspiration/reflux precautions using written handout.  Advised with follow up x1 to assure tolerance.  Requested pt and granddaughter monitor pt's po tolerance closely today and provide specific information to this SLP regarding said information.  Will determine if MBS indicated next date.   SLP Visit Diagnosis: Dysphagia, oral phase (R13.11)    Aspiration Risk  Mild aspiration risk    Diet  Recommendation Dysphagia 3 (Mech soft);Thin liquid   Liquid Administration via: Cup;Straw Medication Administration: (as tolerated) Supervision: Patient able to self feed Compensations: Slow rate;Small sips/bites Postural Changes: Seated upright at 90 degrees;Remain upright for at least 30 minutes after po intake    Other  Recommendations Oral Care Recommendations: Oral care BID   Follow up Recommendations None       Frequency and Duration min 1 x/week  1 week       Prognosis Prognosis for Safe Diet Advancement: Fair Barriers to Reach Goals: Time post onset      Swallow Study   General Date of Onset: 08/13/17 HPI: 72 yo female adm to [facility] with dyspnea - found to have pulmonary edmea.  PMH + for partial SBO, ARF, pulmonary edema, obesity, Afib, diverticulitis, DM2, OSA, enteritis.  Pt has degenerative spondylosis at C5-C6 and CXR indicates developing CHF.   Per RN, pt with delay in swallow initiation and oral holding.  Pt denies h/o dysphagia - and states she has been informed that she has reflux during this hospital stay.  Swallow evaluation ordered.   Type of Study: Bedside Swallow Evaluation Diet Prior to this Study: Dysphagia 3 (soft);Thin liquids Temperature Spikes Noted: No Respiratory Status: Nasal cannula History of Recent Intubation: No Behavior/Cognition: Alert;Cooperative;Pleasant mood Oral Cavity Assessment: Within Functional Limits;Erythema Oral Care Completed by SLP: No Oral Cavity - Dentition: Adequate natural dentition(partial) Vision: Functional for self-feeding Self-Feeding Abilities: Able to feed self Patient Positioning: Upright in bed Baseline Vocal Quality: Normal Volitional Cough: Strong Volitional Swallow: Able to elicit    Oral/Motor/Sensory Function Overall Oral Motor/Sensory Function: Within functional limits   Ice Chips Ice chips: Within functional limits Presentation: Spoon   Thin Liquid Thin Liquid: Impaired Presentation: Cup;Self Fed;Straw Other Comments: pt did not pass 3 ounce water test - she required frequent rest breaks during intake due to dyspnea, cough noted with and without intake    Nectar Thick Nectar Thick Liquid: Not tested   Honey Thick Honey Thick Liquid: Not tested   Puree Puree: Within functional limits Presentation: Self Fed;Spoon   Solid   GO   Solid: Within functional limits Presentation: Self Fed Other Comments: graham  cracker        Macario Golds 08/13/2017,2:21 PM  Luanna Salk, [place] [facility] SLP [phone]

## 2017-08-13 NOTE — Plan of Care (Signed)
  Problem: Fluid Volume: Goal: Fluid volume balance will be maintained or improved Outcome: Progressing   Problem: Respiratory: Goal: Respiratory symptoms related to disease process will be avoided Outcome: Progressing   Problem: Self-Concept: Goal: Body image disturbance will be avoided or minimized Outcome: Progressing   Problem: Urinary Elimination: Goal: Progression of disease will be identified and treated Outcome: Progressing   Problem: Education: Goal: Ability to demonstrate management of disease process will improve Outcome: Progressing Goal: Ability to verbalize understanding of medication therapies will improve Outcome: Progressing   Problem: Cardiac: Goal: Ability to achieve and maintain adequate cardiopulmonary perfusion will improve Outcome: Progressing

## 2017-08-13 NOTE — Progress Notes (Signed)
Physical Therapy Treatment Patient Details Name: Morgan Clay MRN: [id number] DOB: [birth year] Today's Date: 08/13/2017    History of Present Illness 72 yo female admitted to [facility] on 07/31/2017 with abdominal pain.  She is foind to have Acute sigmoid diverticulitis and enteritis.  She has been having pain and limited mobilty with frequent diarrhea     PT Comments    Patient not progressing due to unwilling to get up this session.  Did participate in bed exercises at insistence and encouragement.  Will try to see earlier next session and encourage OOB.   Follow Up Recommendations  SNF     Equipment Recommendations  Rolling walker with 5" wheels    Recommendations for Other Services       Precautions / Restrictions Precautions Precautions: Fall Precaution Comments: fatigues quickly, monitor sats    Mobility  Bed Mobility               General bed mobility comments: NT refused mobilization due to tired from coughing after SLP  Transfers                    Ambulation/Gait                 Stairs            Wheelchair Mobility    Modified Rankin (Stroke Patients Only)       Balance                                            Cognition Arousal/Alertness: Lethargic;Suspect due to medications Behavior During Therapy: [facility] for tasks assessed/performed Overall Cognitive Status: Impaired/Different from baseline Area of Impairment: Attention;Memory;Following commands                   Current Attention Level: Focused Memory: Decreased short-term memory Following Commands: Follows one step commands inconsistently;Follows one step commands with increased time       General Comments: needed increased time and frequent cues to complete LE therex      Exercises General Exercises - Lower Extremity Ankle Circles/Pumps: AAROM;Both;Supine;10 reps Short Arc Quad: AROM;AAROM;Both;10 reps;Supine Heel Slides: AAROM;Both;10  reps;Supine    General Comments        Pertinent Vitals/Pain Pain Assessment: No/denies pain Faces Pain Scale: Hurts little more Pain Location: LE's Pain Descriptors / Indicators: Tightness;Sore Pain Intervention(s): Monitored during session;Repositioned    Home Living                      Prior Function            PT Goals (current goals can now be found in the care plan section) Progress towards PT goals: Not progressing toward goals - comment(self limited this session)    Frequency    Min 2X/week      PT Plan Current plan remains appropriate    Co-evaluation              AM-PAC PT "6 Clicks" Daily Activity  Outcome Measure  Difficulty turning over in bed (including adjusting bedclothes, sheets and blankets)?: A Lot Difficulty moving from lying on back to sitting on the side of the bed? : A Lot Difficulty sitting down on and standing up from a chair with arms (e.g., wheelchair, bedside commode, etc,.)?: Unable Help needed moving to and from a bed to chair (including  a wheelchair)?: A Little Help needed walking in hospital room?: A Lot Help needed climbing 3-5 steps with a railing? : Total 6 Click Score: 11    End of Session Equipment Utilized During Treatment: Oxygen Activity Tolerance: Patient limited by fatigue Patient left: in bed;with call bell/phone within reach   PT Visit Diagnosis: Difficulty in walking, not elsewhere classified (R26.2);Muscle weakness (generalized) (M62.81)     Time: 2182-8833 PT Time Calculation (min) (ACUTE ONLY): 12 min  Charges:  $Therapeutic Exercise: 8-22 mins                    G CodesMagda Kiel, [place] [phone] 08/13/2017    Reginia Naas 08/13/2017, 4:18 PM

## 2017-08-13 NOTE — Progress Notes (Signed)
Pt having difficulty swallowing medications, which pt says is new for her. Writer crushed pills and put in applesauce, but pt still having difficulty swallowing medications. Pt does not appear in distress. VSS. On call [place] notified and asked to place speech evaluation.

## 2017-08-13 NOTE — Progress Notes (Addendum)
Progress Note  Patient Name: Morgan Clay Date of Encounter: 08/13/2017  Primary Cardiologist: Virl Axe, MD   Subjective   Patient feeling worse than yesterday. Still with SOB. Denies CP or palpitations. Reports mid abdominal pain. Reports passing flatus, having BMs, and tolerating a diet. Denies N/V.    Inpatient Medications    Scheduled Meds: . amiodarone  200 mg Oral Daily  . apixaban  5 mg Oral BID  . carvedilol  12.5 mg Oral BID WC  . feeding supplement  1 Container Oral TID BM  . insulin aspart  0-5 Units Subcutaneous QHS  . insulin aspart  0-9 Units Subcutaneous TID WC  . isosorbide-hydrALAZINE  1 tablet Oral BID  . lactobacillus  1 g Oral TID WC  . levothyroxine  125 mcg Oral QAC breakfast  . metoCLOPramide (REGLAN) injection  5 mg Intravenous Q8H  . metolazone  5 mg Oral BID  . pantoprazole  40 mg Oral Daily  . sertraline  50 mg Oral Daily   Continuous Infusions: . furosemide Stopped (08/13/17 0219)   PRN Meds: acetaminophen **OR** acetaminophen, albuterol, hydrALAZINE, iopamidol, menthol-cetylpyridinium, ondansetron (ZOFRAN) IV, phenol, promethazine, traMADol, traZODone   Vital Signs    Vitals:   08/12/17 1357 08/12/17 2029 08/13/17 0433 08/13/17 0533  BP: (!) 156/65 (!) 143/59 (!) 166/60   Pulse: 74 73 83   Resp: (!) 24 (!) 22 (!) 24   Temp: 98.7 F (37.1 C) 98.6 F (37 C) 98.8 F (37.1 C)   TempSrc: Oral Oral Oral   SpO2: 99% 100% 100% 96%  Weight:   172 lb 6.4 oz (78.2 kg)   Height:        Intake/Output Summary (Last 24 hours) at 08/13/2017 0741 Last data filed at 08/13/2017 0435 Gross per 24 hour  Intake 252 ml  Output 2475 ml  Net -2223 ml   Filed Weights   08/11/17 1945 08/12/17 0416 08/13/17 0433  Weight: 178 lb 12.7 oz (81.1 kg) 177 lb 4 oz (80.4 kg) 172 lb 6.4 oz (78.2 kg)    Telemetry    Paced rhythm - Personally Reviewed  Physical Exam   GEN: chronically ill appearing AAF, laying in bed in mild-moderate discomfort     Neck: +JVD, no carotid bruits Cardiac: RRR, + soft murmur, no rubs or gallops.  Respiratory: crackles at bases b/l; hypoxic to 88% on RA; improved to mid 90s on 2L GI: NABS, Soft, diffuse TTP, non-distended  MS: 1-2+ LE edema; 2+ LUE edema Neuro:  Nonfocal, moving all extremities spontaneously Psych: Normal affect   Labs    Chemistry Recent Labs  Lab 08/09/17 1848 08/10/17 0050 08/11/17 0319 08/12/17 0439 08/13/17 0435  NA 139 137 140 135 139  K 3.0* 3.1* 5.0 4.7 4.3  CL 102 100* 104 102 102  CO2 24 23 24 22 23   GLUCOSE 134* 126* 119* 146* 113*  BUN 37* 36* 40* 41* 38*  CREATININE 2.01* 1.97* 2.39* 2.31* 1.98*  CALCIUM 8.7* 8.2* 8.8* 8.7* 9.1  PROT 7.8 7.2  --   --   --   ALBUMIN 3.3* 3.1*  --   --   --   AST 15 14*  --   --   --   ALT 13* 11*  --   --   --   ALKPHOS 41 36*  --   --   --   BILITOT 0.6 0.7  --   --   --   GFRNONAA 24* 25* 20* 20* 25*  GFRAA 28* 29* 23* 24* 28*  ANIONGAP 13 14 12 11 14      Hematology Recent Labs  Lab 08/10/17 0050 08/11/17 0319 08/13/17 0435  WBC 7.5 8.7 9.9  RBC 3.45* 3.59* 3.38*  HGB 9.9* 10.3* 9.7*  HCT 29.7* 31.5* 30.1*  MCV 86.1 87.7 89.1  MCH 28.7 28.7 28.7  MCHC 33.3 32.7 32.2  RDW 15.4 16.1* 16.3*  PLT 198 194 184    Cardiac Enzymes Recent Labs  Lab 08/09/17 1848 08/10/17 0050 08/10/17 0655  TROPONINI 0.08* 0.08* 0.07*   No results for input(s): TROPIPOC in the last 168 hours.   BNPNo results for input(s): BNP, PROBNP in the last 168 hours.   DDimer No results for input(s): DDIMER in the last 168 hours.   Radiology    Dg Abd Acute W/chest  Result Date: 08/11/2017 CLINICAL DATA:  72 year old female with cough, syncope, dyspnea and abdominal distension EXAM: DG ABDOMEN ACUTE W/ 1V CHEST COMPARISON:  Prior acute abdominal series 08/09/2017 FINDINGS: Left subclavian approach biventricular cardiac rhythm maintenance device remains in unchanged position with leads projecting over the right atrium, right  ventricle and overlying the left ventricle. Stable cardiomegaly. Atherosclerotic calcifications again noted in the transverse aorta. Slight interval increase in pulmonary vascular congestion now with mild edema. Interval development of small bilateral pleural effusions. No evidence of free air on the semi-erect or lateral decubitus views. Nonspecific bowel gas pattern with gas in multiple loops of small bowel. There does appear to be diffuse mild small bowel wall thickening with thickening of the valvulae. Dystrophic calcified uterine fibroid. No acute osseous abnormality. IMPRESSION: 1. Developing CHF with increased pulmonary vascular congestion, mild pulmonary edema and new small bilateral pleural effusions. 2. Mild gaseous distention of several loops of small bowel in the mid abdomen with evidence of submucosal thickening/thickening of the valvulae. Differential considerations are broad, but most commonly this represents either infectious or inflammatory enteritis. 3. Cardiomegaly. 4.  Aortic Atherosclerosis (ICD10-170.0) Electronically Signed   By: Jacqulynn Cadet M.D.   On: 08/11/2017 13:15    Cardiac Studies   Echocardiogram 08/11/17: Study Conclusions  - Left ventricle: The cavity size was normal. Wall thickness was   normal. Systolic function was normal. The estimated ejection   fraction was in the range of 60% to 65%. Wall motion was normal;   there were no regional wall motion abnormalities. Features are   consistent with a pseudonormal left ventricular filling pattern,   with concomitant abnormal relaxation and increased filling   pressure (grade 2 diastolic dysfunction). - Aortic valve: There was moderate regurgitation. - Mitral valve: There was moderate regurgitation. - Left atrium: The atrium was mildly dilated. - Right atrium: The atrium was mildly dilated. - Tricuspid valve: There was moderate regurgitation. - Pulmonary arteries: Systolic pressure was moderately increased.   PA  peak pressure: 49 mm Hg (S).  LUE Duplex 08/08/17: Left: No evidence of obstruction was seen in the central veins or arm veins.   Patient Profile     72 y.o. female with a hx of persistent atypical atrial flutter/ atach, severe LA enlargement, OSA, CRI, and nonischemic CM who is being followed by cardiology for acute on chronic diastolic CHF  Assessment & Plan    1. Acute on chronic diastolic CHF: c/o SOB and LE edema. Likely in the setting of IVF for management of AKI and possible HTN contributing. Echo with improved EF from 45-50% up to 60-65% and G2DD. Patient started on high dose IV lasix per nephrology.  UOP with -2.2L in the last 24 hours; up 14.6L this admission. Cr peaked at 2.39, down to 1.98 today.  - Continue diuresis per Nephrology - Resume spironolactone once renal function improves  2. HTN: BP still elevated. Hopeful will improve once Cr stable and spironolactone restarted.  - Continue to monitor closely - Could consider increasing carvedilol to 25mg  BID (home dose)  3. Persistent atrial flutter/fibrillation: maintaining sinus rhythm on amiodarone  - Continue amiodarone and apixaban - Afib clinic appointment today cancelled. Will need to reschedule follow-up appt when closer to discharge  4. Acute on Chronic renal insufficiency: Cr peaked at 2.39, down to 1.98 today. Nephrology following.  - Continue to monitor Cr closely with aggressive IV diuresis.  - Continue management per primary team/ nephrology  5. Diverticulitis vs colitis; AXR now with possible SBO: s/p 7d course of Zosyn. Tolerating a diet and having BMs. Surgery signed off. - Continue management per primary team  6. NICM s/p PPM: device interrogated 07/2017 with normal function  7. LUE Edema: Duplex negative for DVT 08/08/17 - Continue diuresis per nephrology  For questions or updates, please contact [place] Please consult [email or web address] for contact info under Cardiology/STEMI.      Signed, Abigail Butts, PA-C  08/13/2017, 7:41 AM   [phone]  Personally seen and examined. Agree with above.  This afternoon feeling a little bit better.  Mild cough.  She showed me her left hand, edema mildly improving.  Good urine output.  Exam: Regular rate and rhythm with 2/6 systolic murmur, no significant JVD, lungs minimal crackles at bases, abdomen soft, positive anasarca.  Labs: Creatinine peaked 2.39, now 1.98, baseline 1.8. Excellent urine output of 2.4 L yesterday.  Weight down from 179-172.  Assessment and plan:  Acute on chronic diastolic heart failure with superimposed acute kidney injury improving  (EF 60%) -Reviewed Dr. Shelva Majestic note.  Continuing Lasix 160 every 6 and metolazone 5 mg twice a day.  Appreciate guidance of diuresis. -I will go ahead and resume her carvedilol to 25 mg twice a day as home dose.  Her blood pressure remains elevated.  Continue BiDil. -Resume spironolactone if felt to be reasonable from nephrology standpoint once diuresis improved.  Concern would be hyperkalemia in the setting of someone with a baseline creatinine of 1.8.  Paroxysmal atrial fibrillation flutter -Currently sinus rhythm.  Pacemaker.  Amiodarone.  Continue.  Pacemaker -Functioning properly.  Continue to follow the direction of Dr. Moshe Cipro, nephrology team on diuresis.  Please let us know if we can be of further assistance.  We will sign off.  Candee Furbish, MD

## 2017-08-13 NOTE — Care Management Important Message (Signed)
Important Message  Patient Details  Name: Lajune Perine MRN: [id number] Date of Birth: 1945/10/18   Medicare Important Message Given:  Yes    Kerin Salen 08/13/2017, 10:26 AMImportant Message  Patient Details  Name: Tranisha Tissue MRN: [id number] Date of Birth: Apr 10, 1946   Medicare Important Message Given:  Yes    Kerin Salen 08/13/2017, 10:26 AM

## 2017-08-13 NOTE — Progress Notes (Signed)
PROGRESS NOTE    Morgan Clay  PPJ:[id number] DOB: [birth year] DOA: 07/31/2017 PCP: Morgan Kern, DO   Brief Narrative:  Morgan Clay a 72 y.o.femalewith medical history significantfor NICM (last EF 45%) s/pCRT-D, A. fib on Eliquis with recent DCCV on 07/24/17, OSA on CPAP, HCV status post Harvoni (viremic response not documented), hypothyroidism, depression and diet managed type 2 diabetes mellitus who presents to the ED with 5 days of diffuse, generalized abdominal pain, with associated anorexia, nausea and one episode of profuse nonbloody, nonbilious emesis on the day of presentation. CT abdomen and pelvis without contrast showed Suspected mild acute diverticulitis involving the proximal sigmoid colon. Scattered small bowel air-fluid levels and with questionable wall thickening of a few left-sided small bowel loops, query enteritis.  Admitted on 07/31/2017 with the above findings with concerns about possible partial SBO versus diverticulitis versus colitis as well as concerns about possible pneumonia.  Clinically and radiologically patient found to be in pulmonary edema, responded well to high-dose Lasix     Assessment & Plan:   Principal Problem:   Pulmonary edema, acute (HCC) Active Problems:   Hypothyroidism   Obesity   OBSTRUCTIVE SLEEP APNEA   Type 2 diabetes, controlled, with renal manifestation (HCC)   Chronic kidney disease - followed by [facility]   Chronic obstructive pulmonary disease ([place])   Hypertension associated with diabetes ([place])   Persistent atrial fibrillation (HCC)   Diverticulitis   ARF (acute renal failure) (HCC)   Enteritis   Partial small bowel obstruction (HCC)   Pain and swelling of left upper extremity   Acute sigmoid diverticulitis/enteritis Patient has completed the course of IV Zosyn.  Currently she is on regular diet and is able to tolerate without nausea and vomiting.  She continues to have bowel movements.  Abdominal pain has  improved.   Small bowel obstruction Resolved.   COPD Stable no wheezing heard.   Pain and swelling of the left upper per extremity Venous duplex ruled out DVT soft tissue ultrasound showed subcutaneous edema.  Recommend elevation of the arm.  Discussed with the patient and the nurse.  Acute on stage III CKD Baseline creatinine at 1.8, creatinine on admission was 2.1 and it peaked at 3.7.  Nephrology was consulted and her creatinine seems to be improving.    Acute on chronic combined systolic and diastolic heart failure exacerbation on 4/4 Patient was transferred to telemetry.  An echocardiogram was obtained showed an improvement of left ventricular EF to 60% with grade 2 diastolic dysfunction. Cardiology was consulted and recommendations given Recommend high-dose IV Lasix for diuresis. Meanwhile Coreg, BiDil resumed. The chest x-ray ordered today for some dyspnea showed pulmonary edema on IV Lasix at this time.  Monitor creatinine and potassium while on IV Lasix.  Recommend starting spironolactone once appropriate diuresis.  Chronic atrial fibrillation rate controlled on amiodarone and Coreg and Eliquis for anticoagulation  Hypothyroidism resume Synthroid  Review of hepatitis C status post Harwani Outpatient follow-up with ID as needed.  Type 2 diabetes mellitus  CBG (last 3)  Recent Labs    08/12/17 2325 08/13/17 0732 08/13/17 1204  GLUCAP 144* 110* 124*   A1c of 6.8, resume SSI.   Mild normocytic anemia no signs of bleeding.   DVT prophylaxis: Eliquis Code Status: Full code Family Communication: None at bedside today Disposition Plan: Pending further evaluation, resolution of respiratory failure.  Consultants:   Nephrology  Cardiology, surgery   Procedures: Echocardiogram  Antimicrobials: None  Subjective: Reports not feeling good, shortness  of breath  Objective: Vitals:   08/13/17 0533 08/13/17 0855 08/13/17 1000 08/13/17 1538  BP:  (!) 186/75  (!) 150/68 (!) 165/67  Pulse:   80 83  Resp:    18  Temp:    99 F (37.2 C)  TempSrc:    Oral  SpO2: 96%   96%  Weight:      Height:        Intake/Output Summary (Last 24 hours) at 08/13/2017 1640 Last data filed at 08/13/2017 1540 Gross per 24 hour  Intake 732 ml  Output 3050 ml  Net -2318 ml   Filed Weights   08/11/17 1945 08/12/17 0416 08/13/17 0433  Weight: 81.1 kg (178 lb 12.7 oz) 80.4 kg (177 lb 4 oz) 78.2 kg (172 lb 6.4 oz)    Examination:  General exam: chronically ill appearing, on 2 lit [place] oxygen.  Respiratory system: Bibasilar Rales, no wheezing heard. Cardiovascular system: S1 & S2 heard, RRR. No JVD, murmurs, .  Plus pedal edema. Gastrointestinal system: Abdomen is nondistended, soft and nontender. No organomegaly or masses felt. Normal bowel sounds heard. Central nervous system: Alert and oriented. No focal neurological deficits. Extremities: Left upper extremity 2+ edema Skin: No rashes, lesions or ulcers Psychiatry: Judgement and insight appear normal. Mood & affect appropriate.     Data Reviewed: I have personally reviewed following labs and imaging studies  CBC: Recent Labs  Lab 08/07/17 0411 08/09/17 1848 08/10/17 0050 08/11/17 0319 08/13/17 0435  WBC 8.2 7.2 7.5 8.7 9.9  HGB 10.6* 11.0* 9.9* 10.3* 9.7*  HCT 32.9* 32.0* 29.7* 31.5* 30.1*  MCV 88.9 85.3 86.1 87.7 89.1  PLT 184 192 198 194 941   Basic Metabolic Panel: Recent Labs  Lab 08/09/17 1848 08/10/17 0050 08/10/17 0655 08/11/17 0319 08/12/17 0439 08/13/17 0435  NA 139 137  --  140 135 139  K 3.0* 3.1*  --  5.0 4.7 4.3  CL 102 100*  --  104 102 102  CO2 24 23  --  24 22 23   GLUCOSE 134* 126*  --  119* 146* 113*  BUN 37* 36*  --  40* 41* 38*  CREATININE 2.01* 1.97*  --  2.39* 2.31* 1.98*  CALCIUM 8.7* 8.2*  --  8.8* 8.7* 9.1  MG  --   --  1.8  --   --   --    GFR: Estimated Creatinine Clearance: 24.2 mL/min (A) (by C-G formula based on SCr of 1.98 mg/dL (H)). Liver Function  Tests: Recent Labs  Lab 08/09/17 1848 08/10/17 0050  AST 15 14*  ALT 13* 11*  ALKPHOS 41 36*  BILITOT 0.6 0.7  PROT 7.8 7.2  ALBUMIN 3.3* 3.1*   No results for input(s): LIPASE, AMYLASE in the last 168 hours. No results for input(s): AMMONIA in the last 168 hours. Coagulation Profile: No results for input(s): INR, PROTIME in the last 168 hours. Cardiac Enzymes: Recent Labs  Lab 08/09/17 1848 08/10/17 0050 08/10/17 0655  TROPONINI 0.08* 0.08* 0.07*   BNP (last 3 results) No results for input(s): PROBNP in the last 8760 hours. HbA1C: No results for input(s): HGBA1C in the last 72 hours. CBG: Recent Labs  Lab 08/12/17 1137 08/12/17 1642 08/12/17 2325 08/13/17 0732 08/13/17 1204  GLUCAP 166* 102* 144* 110* 124*   Lipid Profile: No results for input(s): CHOL, HDL, LDLCALC, TRIG, CHOLHDL, LDLDIRECT in the last 72 hours. Thyroid Function Tests: No results for input(s): TSH, T4TOTAL, FREET4, T3FREE, THYROIDAB in the last 72 hours.  Anemia Panel: No results for input(s): VITAMINB12, FOLATE, FERRITIN, TIBC, IRON, RETICCTPCT in the last 72 hours. Sepsis Labs: No results for input(s): PROCALCITON, LATICACIDVEN in the last 168 hours.  Recent Results (from the past 240 hour(s))  C difficile quick scan w PCR reflex     Status: None   Collection Time: 08/06/17 10:16 AM  Result Value Ref Range Status   C Diff antigen NEGATIVE NEGATIVE Final   C Diff toxin NEGATIVE NEGATIVE Final   C Diff interpretation No C. difficile detected.  Final    Comment: Performed at [facility], [place] [address], [place], [place] [address]  MRSA PCR Screening     Status: None   Collection Time: 08/09/17  8:00 PM  Result Value Ref Range Status   MRSA by PCR NEGATIVE NEGATIVE Final    Comment:        The GeneXpert MRSA Assay (FDA approved for NASAL specimens only), is one component of a comprehensive MRSA colonization surveillance program. It is not intended to diagnose  MRSA infection nor to guide or monitor treatment for MRSA infections. Performed at [facility], [place] [address], [place], [place] [address]          Radiology Studies: Dg Chest Port 1 View  Result Date: 08/13/2017 CLINICAL DATA:  Syncope and shortness of breath EXAM: PORTABLE CHEST 1 VIEW COMPARISON:  August 11, 2017 FINDINGS: There is diffuse interstitial and alveolar opacity felt to represent edema throughout the mid and lower lung zones. There is cardiomegaly with pulmonary venous hypertension. There is a small right effusion. Pacemaker leads are attached the right atrium, right ventricle, and coronary sinus. There is aortic atherosclerosis. No adenopathy. No bone lesions. IMPRESSION: Widespread interstitial and alveolar edema with cardiomegaly, right effusion, and pulmonary venous hypertension. These are findings indicative of congestive heart failure. Pacemaker leads as described. There is aortic atherosclerosis. Aortic Atherosclerosis (ICD10-I70.0). Electronically Signed   By: Lowella Grip III M.D.   On: 08/13/2017 09:36        Scheduled Meds: . amiodarone  200 mg Oral Daily  . apixaban  5 mg Oral BID  . carvedilol  25 mg Oral BID WC  . feeding supplement  1 Container Oral TID BM  . insulin aspart  0-5 Units Subcutaneous QHS  . insulin aspart  0-9 Units Subcutaneous TID WC  . isosorbide-hydrALAZINE  1 tablet Oral BID  . lactobacillus  1 g Oral TID WC  . levothyroxine  125 mcg Oral QAC breakfast  . metoCLOPramide (REGLAN) injection  5 mg Intravenous Q8H  . metolazone  5 mg Oral BID  . pantoprazole  40 mg Oral Daily  . sertraline  50 mg Oral Daily   Continuous Infusions: . furosemide 160 mg (08/13/17 1500)     LOS: 13 days    Time spent: 35 minutes.     Hosie Poisson, MD [facility] Pager [phone]   If 7PM-7AM, please contact night-coverage [email or web address] Password [email or web address] 08/13/2017, 4:40 PM

## 2017-08-13 NOTE — Progress Notes (Signed)
SATURATION QUALIFICATIONS:   Patient Saturations on Room Air at Rest = 88 %  Patient Saturations on Room Air while Ambulating =ambulating stat not completed at this time pt had increased dyspnea.  Patient Saturations on 2 Liters of oxygen while at rest 96 %  Please briefly explain why patient needs home oxygen:

## 2017-08-14 LAB — RENAL FUNCTION PANEL
ALBUMIN: 3.2 g/dL — AB (ref 3.5–5.0)
ANION GAP: 14 (ref 5–15)
BUN: 38 mg/dL — AB (ref 6–20)
CALCIUM: 9.1 mg/dL (ref 8.9–10.3)
CO2: 27 mmol/L (ref 22–32)
Chloride: 96 mmol/L — ABNORMAL LOW (ref 101–111)
Creatinine, Ser: 1.8 mg/dL — ABNORMAL HIGH (ref 0.44–1.00)
GFR calc Af Amer: 32 mL/min — ABNORMAL LOW (ref >60)
GFR calc non Af Amer: 28 mL/min — ABNORMAL LOW (ref >60)
GLUCOSE: 109 mg/dL — AB (ref 65–99)
PHOSPHORUS: 2.7 mg/dL (ref 2.5–4.6)
Potassium: 3.5 mmol/L (ref 3.5–5.1)
SODIUM: 137 mmol/L (ref 135–145)

## 2017-08-14 LAB — GLUCOSE, CAPILLARY
GLUCOSE-CAPILLARY: 174 mg/dL — AB (ref 65–99)
Glucose-Capillary: 104 mg/dL — ABNORMAL HIGH (ref 65–99)
Glucose-Capillary: 173 mg/dL — ABNORMAL HIGH (ref 65–99)
Glucose-Capillary: 169 mg/dL — ABNORMAL HIGH (ref 65–99)

## 2017-08-14 MED ORDER — POTASSIUM CHLORIDE CRYS ER 20 MEQ PO TBCR
20.0000 meq | EXTENDED_RELEASE_TABLET | Freq: Once | ORAL | Status: AC
  Administered 2017-08-14: 20 meq via ORAL
  Filled 2017-08-14: qty 1

## 2017-08-14 MED ORDER — CARVEDILOL 12.5 MG PO TABS
12.5000 mg | ORAL_TABLET | Freq: Two times a day (BID) | ORAL | Status: DC
  Administered 2017-08-14 – 2017-08-17 (×6): 12.5 mg via ORAL
  Filled 2017-08-14 (×6): qty 1

## 2017-08-14 NOTE — Progress Notes (Signed)
PROGRESS NOTE    Morgan Clay  XNA:[id number] DOB: 03/13/1946 DOA: 07/31/2017 PCP: [name], DO   Brief Narrative:  Morgan Clay a 72 y.o.femalewith medical history significantfor NICM (last EF 45%) s/pCRT-D, A. fib on Eliquis with recent DCCV on 07/24/17, OSA on CPAP, HCV status post Harvoni (viremic response not documented), hypothyroidism, depression and diet managed type 2 diabetes mellitus who presents to the ED with 5 days of diffuse, generalized abdominal pain, with associated anorexia, nausea and one episode of profuse nonbloody, nonbilious emesis on the day of presentation. CT abdomen and pelvis without contrast showed Suspected mild acute diverticulitis involving the proximal sigmoid colon. Scattered small bowel air-fluid levels and with questionable wall thickening of a few left-sided small bowel loops, query enteritis.  Admitted on 07/31/2017 with the above findings with concerns about possible partial SBO versus diverticulitis versus colitis as well as concerns about possible pneumonia.  Clinically and radiologically patient found to be in pulmonary edema, responded well to high-dose Lasix     Assessment & Plan:   Principal Problem:   Pulmonary edema, acute (HCC) Active Problems:   Hypothyroidism   Obesity   OBSTRUCTIVE SLEEP APNEA   Type 2 diabetes, controlled, with renal manifestation (HCC)   Chronic kidney disease - followed by [facility]   Chronic obstructive pulmonary disease ([place])   Hypertension associated with diabetes ([place])   Persistent atrial fibrillation (HCC)   Diverticulitis   ARF (acute renal failure) (HCC)   Enteritis   Partial small bowel obstruction (HCC)   Pain and swelling of left upper extremity   Acute sigmoid diverticulitis/enteritis Patient has completed the course of IV Zosyn.  Currently she is on regular diet and is able to tolerate without nausea and vomiting.  She continues to have bowel movements.  Abdominal pain has  improved.   Small bowel obstruction Resolved.   COPD Stable no wheezing heard.   Pain and swelling of the left upper per extremity Venous duplex ruled out DVT soft tissue ultrasound showed subcutaneous edema.  Recommend elevation of the arm.  Discussed with the patient and the nurse.  Acute on stage III CKD Baseline creatinine at 1.8, creatinine on admission was 2.1 and it peaked at 3.7.  Nephrology was consulted and her creatinine seems to be improving. Pt responding appropriately well to IV lasix and diuresed about 3.6 lit last 24 hours and her creatinine is back to baseline at 1.8. Plan for stopping metolazone, and restart spironolactone on discharge.   Acute on chronic combined systolic and diastolic heart failure exacerbation on 4/4 Patient was transferred to telemetry.  An echocardiogram was obtained showed an improvement of left ventricular EF to 60% with grade 2 diastolic dysfunction. Cardiology was consulted and recommendations given Recommend high-dose IV Lasix for diuresis. Meanwhile Coreg, BiDil resumed. The chest x-ray ordered today for some dyspnea showed pulmonary edema on IV Lasix at this time.  Monitor creatinine and potassium while on IV Lasix.  Recommend starting spironolactone once appropriate diuresis, probably on discharge.  Chronic atrial fibrillation rate controlled on amiodarone and Coreg and Eliquis for anticoagulation  Hypothyroidism resume Synthroid  Review of hepatitis C status post Harwani Outpatient follow-up with ID as needed.  Type 2 diabetes mellitus  CBG (last 3)  Recent Labs    08/14/17 0742 08/14/17 1150 08/14/17 1652  GLUCAP 104* 173* 174*   A1c of 6.8, resume SSI.   Mild normocytic anemia no signs of bleeding.   DVT prophylaxis: Eliquis Code Status: Full code Family Communication:  None at bedside today Disposition Plan: Pending further evaluation, resolution of respiratory failure.SNF in 1 to 2 days.   Consultants:    Nephrology  Cardiology, surgery   Procedures: Echocardiogram  Antimicrobials: None  Subjective: Sob better than yesterday but not back to baseline. Plan to wean her off the oxygen in the 24 hours.    Objective: Vitals:   08/13/17 2035 08/14/17 0545 08/14/17 1123 08/14/17 1547  BP: (!) 143/57 132/66 128/66 (!) 124/51  Pulse: 70 78 68 68  Resp: 18 20  19   Temp: 98.4 F (36.9 C) 98.7 F (37.1 C)  98.2 F (36.8 C)  TempSrc: Oral Oral  Oral  SpO2: 100% 97% (!) 20% 99%  Weight:      Height:        Intake/Output Summary (Last 24 hours) at 08/14/2017 1729 Last data filed at 08/14/2017 1547 Gross per 24 hour  Intake 1100 ml  Output 5278 ml  Net -4178 ml   Filed Weights   08/11/17 1945 08/12/17 0416 08/13/17 0433  Weight: 81.1 kg (178 lb 12.7 oz) 80.4 kg (177 lb 4 oz) 78.2 kg (172 lb 6.4 oz)    Examination:  General exam: chronically ill appearing, on 2 lit [place] oxygen. Not in distress.  Respiratory system: improved rales. No wheezing or rhonchi. Air entry fair.  Cardiovascular system: S1 & S2 heard, RRR. No JVD, murmurs, . 1 Plus pedal edema. Gastrointestinal system: Abdomen is soft NT ND BS+ Central nervous system: Alert and oriented. Non focal.  Extremities: Left upper extremity 2+ edema, improving.  Skin: No rashes, lesions or ulcers Psychiatry:  Mood & affect appropriate.     Data Reviewed: I have personally reviewed following labs and imaging studies  CBC: Recent Labs  Lab 08/09/17 1848 08/10/17 0050 08/11/17 0319 08/13/17 0435  WBC 7.2 7.5 8.7 9.9  HGB 11.0* 9.9* 10.3* 9.7*  HCT 32.0* 29.7* 31.5* 30.1*  MCV 85.3 86.1 87.7 89.1  PLT 192 198 194 371   Basic Metabolic Panel: Recent Labs  Lab 08/10/17 0050 08/10/17 0655 08/11/17 0319 08/12/17 0439 08/13/17 0435 08/14/17 0435  NA 137  --  140 135 139 137  K 3.1*  --  5.0 4.7 4.3 3.5  CL 100*  --  104 102 102 96*  CO2 23  --  24 22 23 27   GLUCOSE 126*  --  119* 146* 113* 109*  BUN 36*  --  40*  41* 38* 38*  CREATININE 1.97*  --  2.39* 2.31* 1.98* 1.80*  CALCIUM 8.2*  --  8.8* 8.7* 9.1 9.1  MG  --  1.8  --   --   --   --   PHOS  --   --   --   --   --  2.7   GFR: Estimated Creatinine Clearance: 26.6 mL/min (A) (by C-G formula based on SCr of 1.8 mg/dL (H)). Liver Function Tests: Recent Labs  Lab 08/09/17 1848 08/10/17 0050 08/14/17 0435  AST 15 14*  --   ALT 13* 11*  --   ALKPHOS 41 36*  --   BILITOT 0.6 0.7  --   PROT 7.8 7.2  --   ALBUMIN 3.3* 3.1* 3.2*   No results for input(s): LIPASE, AMYLASE in the last 168 hours. No results for input(s): AMMONIA in the last 168 hours. Coagulation Profile: No results for input(s): INR, PROTIME in the last 168 hours. Cardiac Enzymes: Recent Labs  Lab 08/09/17 1848 08/10/17 0050 08/10/17 0655  TROPONINI 0.08*  0.08* 0.07*   BNP (last 3 results) No results for input(s): PROBNP in the last 8760 hours. HbA1C: No results for input(s): HGBA1C in the last 72 hours. CBG: Recent Labs  Lab 08/13/17 1654 08/13/17 2054 08/14/17 0742 08/14/17 1150 08/14/17 1652  GLUCAP 217* 91 104* 173* 174*   Lipid Profile: No results for input(s): CHOL, HDL, LDLCALC, TRIG, CHOLHDL, LDLDIRECT in the last 72 hours. Thyroid Function Tests: No results for input(s): TSH, T4TOTAL, FREET4, T3FREE, THYROIDAB in the last 72 hours. Anemia Panel: No results for input(s): VITAMINB12, FOLATE, FERRITIN, TIBC, IRON, RETICCTPCT in the last 72 hours. Sepsis Labs: No results for input(s): PROCALCITON, LATICACIDVEN in the last 168 hours.  Recent Results (from the past 240 hour(s))  C difficile quick scan w PCR reflex     Status: None   Collection Time: 08/06/17 10:16 AM  Result Value Ref Range Status   C Diff antigen NEGATIVE NEGATIVE Final   C Diff toxin NEGATIVE NEGATIVE Final   C Diff interpretation No C. difficile detected.  Final    Comment: Performed at [facility], [place] [address], [place], [place] [address]  MRSA PCR Screening      Status: None   Collection Time: 08/09/17  8:00 PM  Result Value Ref Range Status   MRSA by PCR NEGATIVE NEGATIVE Final    Comment:        The GeneXpert MRSA Assay (FDA approved for NASAL specimens only), is one component of a comprehensive MRSA colonization surveillance program. It is not intended to diagnose MRSA infection nor to guide or monitor treatment for MRSA infections. Performed at [facility], [place] [address], [place],  [address]          Radiology Studies: Dg Chest Port 1 View  Result Date: 08/13/2017 CLINICAL DATA:  Syncope and shortness of breath EXAM: PORTABLE CHEST 1 VIEW COMPARISON:  August 11, 2017 FINDINGS: There is diffuse interstitial and alveolar opacity felt to represent edema throughout the mid and lower lung zones. There is cardiomegaly with pulmonary venous hypertension. There is a small right effusion. Pacemaker leads are attached the right atrium, right ventricle, and coronary sinus. There is aortic atherosclerosis. No adenopathy. No bone lesions. IMPRESSION: Widespread interstitial and alveolar edema with cardiomegaly, right effusion, and pulmonary venous hypertension. These are findings indicative of congestive heart failure. Pacemaker leads as described. There is aortic atherosclerosis. Aortic Atherosclerosis (ICD10-I70.0). Electronically Signed   By: Lowella Grip III M.D.   On: 08/13/2017 09:36        Scheduled Meds: . amiodarone  200 mg Oral Daily  . apixaban  5 mg Oral BID  . carvedilol  12.5 mg Oral BID WC  . feeding supplement  1 Container Oral TID BM  . insulin aspart  0-5 Units Subcutaneous QHS  . insulin aspart  0-9 Units Subcutaneous TID WC  . isosorbide-hydrALAZINE  1 tablet Oral BID  . lactobacillus  1 g Oral TID WC  . levothyroxine  125 mcg Oral QAC breakfast  . metoCLOPramide (REGLAN) injection  5 mg Intravenous Q8H  . pantoprazole  40 mg Oral Daily  . sertraline  50 mg Oral Daily   Continuous  Infusions: . furosemide 160 mg (08/14/17 1500)     LOS: 14 days    Time spent: 35 minutes.     Hosie Poisson, MD [facility] Pager [phone]   If 7PM-7AM, please contact night-coverage [email or web address] Password [email or web address] 08/14/2017, 5:29 PM

## 2017-08-14 NOTE — Progress Notes (Signed)
  Speech Language Pathology Treatment: Dysphagia  Patient Details Name: Morgan Clay MRN: [id number] DOB: 02-26-1946 Today's Date: 08/14/2017 Time: 2217-9810 SLP Time Calculation (min) (ACUTE ONLY): 10 min  Assessment / Plan / Recommendation Clinical Impression  Follow up today to assure tolerance of po diet and assure pt without significant dysphagia. Pt today passed 3 ounce water test without difficulties.     She admits to some dyspnea but denies coughing associated with po intake.  Provided pt with aspiration precautions due to respiratory deficits.  Advised she continue to follow reflux precautions - Using teach back, pt educated and provided written instructions. No SLP follow up indicated as suspect pt's cough is CHF related and not due to dysphagia.  Thanks for allowing me to help care for this pt.         HPI HPI: 72 yo female adm to [facility] with dyspnea - found to have pulmonary edmea.  PMH + for partial SBO, ARF, pulmonary edema, obesity, Afib, diverticulitis, DM2, OSA, enteritis.  Pt has degenerative spondylosis at C5-C6 and CXR indicates developing CHF.   Per RN, pt with delay in swallow initiation and oral holding.  Pt denies h/o dysphagia - and states she has been informed that she has reflux during this hospital stay.  Swallow evaluation ordered.        SLP Plan  All goals met       Recommendations  Diet recommendations: Regular;Thin liquid Liquids provided via: Cup Medication Administration: (as tolerated) Supervision: Patient able to self feed Compensations: Slow rate;Small sips/bites                Oral Care Recommendations: Oral care BID Follow up Recommendations: None SLP Visit Diagnosis: Dysphagia, oral phase (R13.11) Plan: All goals met       GO                Morgan Clay 08/14/2017, 10:30 AM  Morgan Clay, [facility] SLP [phone]

## 2017-08-14 NOTE — Progress Notes (Signed)
[facility] Progress Note  Subjective: 3600 UOP- crt down again, SOB better , weight down again as well as BP - MS better today as well - she still feels swollen and SOB especially with exertion   Vitals:   08/13/17 1538 08/13/17 2035 08/14/17 0545 08/14/17 1123  BP: (!) 165/67 (!) 143/57 132/66 128/66  Pulse: 83 70 78 68  Resp: 18 18 20    Temp: 99 F (37.2 C) 98.4 F (36.9 C) 98.7 F (37.1 C)   TempSrc: Oral Oral Oral   SpO2: 96% 100% 97% (!) 20%  Weight:      Height:        Inpatient medications: . amiodarone  200 mg Oral Daily  . apixaban  5 mg Oral BID  . carvedilol  25 mg Oral BID WC  . feeding supplement  1 Container Oral TID BM  . insulin aspart  0-5 Units Subcutaneous QHS  . insulin aspart  0-9 Units Subcutaneous TID WC  . isosorbide-hydrALAZINE  1 tablet Oral BID  . lactobacillus  1 g Oral TID WC  . levothyroxine  125 mcg Oral QAC breakfast  . metoCLOPramide (REGLAN) injection  5 mg Intravenous Q8H  . metolazone  5 mg Oral BID  . pantoprazole  40 mg Oral Daily  . sertraline  50 mg Oral Daily   . furosemide 160 mg (08/14/17 0819)   acetaminophen **OR** acetaminophen, albuterol, hydrALAZINE, iopamidol, menthol-cetylpyridinium, ondansetron (ZOFRAN) IV, phenol, promethazine, traMADol, traZODone  Exam: Alert, nasal O2 Chest bilat rales 1/3 up, no ^wob Cor RRR Abd tense, nontender, +bs Ext diffuse moderate pitting edema bilat LE's up to hips +UE edema L > R facial edema NF, ox 3  CXR 4/3 worsening pulm edema   Impression: 1  AKI/ CKD3 - baseline creat 1.8 is at baseline, needs aggressive diuresis for gross vol overload- finally making progress 2  Vol overload / pulm edema/ decomp CHF - sig wt increase, poor UOP , diffuse edema, hx HFpEF; on lasix IV 160 q 6 and metolazone 5 BID- better UOP- cont same meds for now.  Also on coreg and bidil, BP down- will decrease coreg so that BP does not get too low.  Says her usual weight is in the high 140's-  still 172 as of yesterday  3  Diverticulitis - sp 7 days iv abx 4  H/o HFpEF - as above 5  Chronic afib 6  DM on insulin 7  Pain - she was having hallucinations/ slurred speech and asterixis, dc'd all narcotics , much better MS 8  Anemia - stable 9  HTN on bidil/ coreg, lasix 10. Dispo- use IV meds for diuresis for next 24 hours- but stop metolazine, then try to get on oral regimen in anticipation for home      Inette Doubrava A  08/14/2017, 11:32 AM   Recent Labs  Lab 08/12/17 0439 08/13/17 0435 08/14/17 0435  NA 135 139 137  K 4.7 4.3 3.5  CL 102 102 96*  CO2 22 23 27   GLUCOSE 146* 113* 109*  BUN 41* 38* 38*  CREATININE 2.31* 1.98* 1.80*  CALCIUM 8.7* 9.1 9.1  PHOS  --   --  2.7   Recent Labs  Lab 08/09/17 1848 08/10/17 0050 08/14/17 0435  AST 15 14*  --   ALT 13* 11*  --   ALKPHOS 41 36*  --   BILITOT 0.6 0.7  --   PROT 7.8 7.2  --   ALBUMIN 3.3* 3.1* 3.2*  Recent Labs  Lab 08/10/17 0050 08/11/17 0319 08/13/17 0435  WBC 7.5 8.7 9.9  HGB 9.9* 10.3* 9.7*  HCT 29.7* 31.5* 30.1*  MCV 86.1 87.7 89.1  PLT 198 194 184   Iron/TIBC/Ferritin/ %Sat No results found for: IRON, TIBC, FERRITIN, IRONPCTSAT

## 2017-08-15 DIAGNOSIS — E1122 Type 2 diabetes mellitus with diabetic chronic kidney disease: Secondary | ICD-10-CM

## 2017-08-15 DIAGNOSIS — J449 Chronic obstructive pulmonary disease, unspecified: Secondary | ICD-10-CM

## 2017-08-15 DIAGNOSIS — E038 Other specified hypothyroidism: Secondary | ICD-10-CM

## 2017-08-15 LAB — GLUCOSE, CAPILLARY
GLUCOSE-CAPILLARY: 126 mg/dL — AB (ref 65–99)
Glucose-Capillary: 101 mg/dL — ABNORMAL HIGH (ref 65–99)
Glucose-Capillary: 108 mg/dL — ABNORMAL HIGH (ref 65–99)
Glucose-Capillary: 119 mg/dL — ABNORMAL HIGH (ref 65–99)

## 2017-08-15 LAB — RENAL FUNCTION PANEL
Albumin: 3 g/dL — ABNORMAL LOW (ref 3.5–5.0)
Anion gap: 14 (ref 5–15)
BUN: 38 mg/dL — AB (ref 6–20)
CALCIUM: 9.1 mg/dL (ref 8.9–10.3)
CO2: 30 mmol/L (ref 22–32)
Chloride: 92 mmol/L — ABNORMAL LOW (ref 101–111)
Creatinine, Ser: 1.69 mg/dL — ABNORMAL HIGH (ref 0.44–1.00)
GFR, EST AFRICAN AMERICAN: 35 mL/min — AB (ref >60)
GFR, EST NON AFRICAN AMERICAN: 30 mL/min — AB (ref >60)
Glucose, Bld: 111 mg/dL — ABNORMAL HIGH (ref 65–99)
Phosphorus: 2.8 mg/dL (ref 2.5–4.6)
Potassium: 3.2 mmol/L — ABNORMAL LOW (ref 3.5–5.1)
SODIUM: 136 mmol/L (ref 135–145)

## 2017-08-15 MED ORDER — POTASSIUM CHLORIDE CRYS ER 20 MEQ PO TBCR
40.0000 meq | EXTENDED_RELEASE_TABLET | Freq: Once | ORAL | Status: AC
  Administered 2017-08-15: 40 meq via ORAL
  Filled 2017-08-15: qty 2

## 2017-08-15 MED ORDER — FUROSEMIDE 40 MG PO TABS
40.0000 mg | ORAL_TABLET | Freq: Two times a day (BID) | ORAL | Status: DC
  Administered 2017-08-15 – 2017-08-17 (×4): 40 mg via ORAL
  Filled 2017-08-15 (×4): qty 1

## 2017-08-15 MED ORDER — SPIRONOLACTONE 25 MG PO TABS
25.0000 mg | ORAL_TABLET | Freq: Every day | ORAL | Status: DC
  Administered 2017-08-16 – 2017-08-17 (×2): 25 mg via ORAL
  Filled 2017-08-15 (×2): qty 1

## 2017-08-15 NOTE — Progress Notes (Signed)
[facility] Progress Note  Subjective: 3200 UOP- crt down again, SOB better , weight down again - she still feels swollen and SOB especially with exertion   Vitals:   08/14/17 2049 08/15/17 0234 08/15/17 0613 08/15/17 1249  BP: 124/60 (!) 134/58 (!) 124/50 (!) 174/72  Pulse: 73 63 66 62  Resp: 18 18 16 18   Temp: 99.5 F (37.5 C) 98.6 F (37 C) 99 F (37.2 C) 98.7 F (37.1 C)  TempSrc: Oral Oral Oral Oral  SpO2: 100% 98% 100% 98%  Weight:      Height:        Inpatient medications: . amiodarone  200 mg Oral Daily  . apixaban  5 mg Oral BID  . carvedilol  12.5 mg Oral BID WC  . feeding supplement  1 Container Oral TID BM  . insulin aspart  0-5 Units Subcutaneous QHS  . insulin aspart  0-9 Units Subcutaneous TID WC  . isosorbide-hydrALAZINE  1 tablet Oral BID  . lactobacillus  1 g Oral TID WC  . levothyroxine  125 mcg Oral QAC breakfast  . metoCLOPramide (REGLAN) injection  5 mg Intravenous Q8H  . pantoprazole  40 mg Oral Daily  . sertraline  50 mg Oral Daily   . furosemide Stopped (08/15/17 1018)   acetaminophen **OR** acetaminophen, albuterol, hydrALAZINE, iopamidol, menthol-cetylpyridinium, ondansetron (ZOFRAN) IV, phenol, promethazine, traMADol, traZODone  Exam: Alert, nasal O2 Chest bilat rales 1/3 up, no ^wob Cor RRR Abd tense, nontender, +bs Ext diffuse moderate pitting edema bilat LE's up to hips +UE edema L > R facial edema NF, ox 3  CXR 4/3 worsening pulm edema   Impression: 1  AKI/ CKD3 - baseline creat 1.8 is at baseline, needs aggressive diuresis for gross vol overload- finally making progress.  Creatinine better than baseline at present 2  Vol overload / pulm edema/ decomp CHF - sig wt increase, poor UOP , diffuse edema, hx HFpEF; req lasix IV 160 q 6 and metolazone 5 BID- better UOP-   Also on coreg and bidil, BP down- have decreased coreg so that BP does not get too low.  Says her usual weight is in the high 140's- still 172 as of 4/8.   Will order weight today.  Try to get back to close on her home diuretic dose.  Normally 60mg  daily and aldactone 25- will order 40 BID and resume aldactone.  I would wait on resuming entresto  3  Diverticulitis - sp 7 days iv abx 4  H/o HFpEF - as above 5  Chronic afib 6  DM on insulin 7  Pain - she was having hallucinations/ slurred speech and asterixis, dc'd all narcotics , much better MS 8  Anemia - stable 9  HTN on bidil/ coreg, lasix 10. Dispo- resume  oral regimen today  in anticipation for home.  I will make sure I arrange follow up with [name] upon D/C 11. Hypokalemia- agree with repletion- aldactone should help her hold onto K as well      Morgan Clay A  08/15/2017, 12:51 PM   Recent Labs  Lab 08/13/17 0435 08/14/17 0435 08/15/17 0437  NA 139 137 136  K 4.3 3.5 3.2*  CL 102 96* 92*  CO2 23 27 30   GLUCOSE 113* 109* 111*  BUN 38* 38* 38*  CREATININE 1.98* 1.80* 1.69*  CALCIUM 9.1 9.1 9.1  PHOS  --  2.7 2.8   Recent Labs  Lab 08/09/17 1848 08/10/17 0050 08/14/17 0435 08/15/17 9417  AST 15 14*  --   --   ALT 13* 11*  --   --   ALKPHOS 41 36*  --   --   BILITOT 0.6 0.7  --   --   PROT 7.8 7.2  --   --   ALBUMIN 3.3* 3.1* 3.2* 3.0*   Recent Labs  Lab 08/10/17 0050 08/11/17 0319 08/13/17 0435  WBC 7.5 8.7 9.9  HGB 9.9* 10.3* 9.7*  HCT 29.7* 31.5* 30.1*  MCV 86.1 87.7 89.1  PLT 198 194 184   Iron/TIBC/Ferritin/ %Sat No results found for: IRON, TIBC, FERRITIN, IRONPCTSAT

## 2017-08-15 NOTE — Progress Notes (Signed)
PROGRESS NOTE    Morgan Clay  DJS:[id number] DOB: 13-Oct-1945 DOA: 07/31/2017 PCP: [name], DO   Brief Narrative:  Morgan Clay a 72 y.o.femalewith medical history significantfor NICM (last EF 45%) s/pCRT-D, A. fib on Eliquis with recent DCCV on 07/24/17, OSA on CPAP, HCV status post Harvoni (viremic response not documented), hypothyroidism, depression and diet managed type 2 diabetes mellitus who presents to the ED with 5 days of diffuse, generalized abdominal pain, with associated anorexia, nausea and one episode of profuse nonbloody, nonbilious emesis on the day of presentation. CT abdomen and pelvis without contrast showed Suspected mild acute diverticulitis involving the proximal sigmoid colon. Scattered small bowel air-fluid levels and with questionable wall thickening of a few left-sided small bowel loops, query enteritis.  Admitted on 07/31/2017 with the above findings with concerns about possible partial SBO versus diverticulitis versus colitis as well as concerns about possible pneumonia.  Clinically and radiologically patient found to be in pulmonary edema, responded well to high-dose Lasix     Assessment & Plan:   Principal Problem:   Pulmonary edema, acute (HCC) Active Problems:   Hypothyroidism   Obesity   OBSTRUCTIVE SLEEP APNEA   Type 2 diabetes, controlled, with renal manifestation (HCC)   Chronic kidney disease - followed by [facility]   Chronic obstructive pulmonary disease ([place])   Hypertension associated with diabetes ([place])   Persistent atrial fibrillation (HCC)   Diverticulitis   ARF (acute renal failure) (HCC)   Enteritis   Partial small bowel obstruction (HCC)   Pain and swelling of left upper extremity   Acute sigmoid diverticulitis/enteritis Patient has completed the course of IV Zosyn.  Currently she is on regular diet and is able to tolerate without nausea and vomiting.  She continues to have bowel movements frequently.   Abdominal pain has improved.   Small bowel obstruction Resolved.   COPD Stable no wheezing heard.   Pain and swelling of the left upper per extremity Venous duplex ruled out DVT soft tissue ultrasound showed subcutaneous edema.  Recommend elevation of the arm.  Discussed with the patient and the nurse. Left upper extremity edema has improved. Pain is resolved.   Acute on stage III CKD Baseline creatinine at 1.8, creatinine on admission was 2.1 and it peaked at 3.7.  Nephrology was consulted and her creatinine seems to be improving. Pt responding appropriately well to IV lasix and diuresed about 3.2 lit last 24 hours and her creatinine is at 1.69. Change to oral lasix and started the patient on spironolactone.   Acute on chronic combined systolic and diastolic heart failure exacerbation on 4/4 Patient was transferred to telemetry.  An echocardiogram was obtained showed an improvement of left ventricular EF to 60% with grade 2 diastolic dysfunction. Cardiology was consulted and recommendations given Recommended high-dose IV Lasix , had appropriate diuresis and plan to transition to oral lasix 40 mg BID.  Meanwhile Coreg, BiDil resumed. Spironolactone resumed.  Recheck BMP IN AM   Chronic atrial fibrillation rate controlled on amiodarone and Coreg and Eliquis for anticoagulation  Hypothyroidism resume Synthroid  Review of hepatitis C status post Harwani Outpatient follow-up with ID as needed.  Type 2 diabetes mellitus  CBG (last 3)  Recent Labs    08/14/17 1652 08/14/17 2156 08/15/17 0728  GLUCAP 174* 169* 108*   A1c of 6.8, resume SSI. No change in meds.    Mild normocytic anemia no signs of bleeding.   DVT prophylaxis: Eliquis Code Status: Full code Family Communication: None  at bedside today Disposition Plan: Pending further evaluation, resolution of respiratory failure.SNF in 1 to 2 days.   Consultants:   Nephrology  Cardiology, surgery   Procedures:  Echocardiogram  Antimicrobials: None  Subjective: Feeling better. No chest pain, sob improved.    Objective: Vitals:   08/14/17 1547 08/14/17 2049 08/15/17 0234 08/15/17 0613  BP: (!) 124/51 124/60 (!) 134/58 (!) 124/50  Pulse: 68 73 63 66  Resp: 19 18 18 16   Temp: 98.2 F (36.8 C) 99.5 F (37.5 C) 98.6 F (37 C) 99 F (37.2 C)  TempSrc: Oral Oral Oral Oral  SpO2: 99% 100% 98% 100%  Weight:      Height:        Intake/Output Summary (Last 24 hours) at 08/15/2017 0941 Last data filed at 08/15/2017 0527 Gross per 24 hour  Intake 372 ml  Output 2105 ml  Net -1733 ml   Filed Weights   08/11/17 1945 08/12/17 0416 08/13/17 0433  Weight: 81.1 kg (178 lb 12.7 oz) 80.4 kg (177 lb 4 oz) 78.2 kg (172 lb 6.4 oz)    Examination:  General exam:calm and comfortable.  Respiratory system: air entry bil.no wheezing or rhonchi.  Cardiovascular system: S1 & S2 heard, RRR. No JVD, murmurs, . 1 Plus pedal edema. Gastrointestinal system: Abdomen is soft non tender non distended bowel sounds heard. Central nervous system: Alert and oriented. Non focal.  Extremities: Left upper extremity 2+ edema, improving.  Skin: No rashes, lesions or ulcers Psychiatry:  Mood & affect appropriate.     Data Reviewed: I have personally reviewed following labs and imaging studies  CBC: Recent Labs  Lab 08/09/17 1848 08/10/17 0050 08/11/17 0319 08/13/17 0435  WBC 7.2 7.5 8.7 9.9  HGB 11.0* 9.9* 10.3* 9.7*  HCT 32.0* 29.7* 31.5* 30.1*  MCV 85.3 86.1 87.7 89.1  PLT 192 198 194 161   Basic Metabolic Panel: Recent Labs  Lab 08/10/17 0655 08/11/17 0319 08/12/17 0439 08/13/17 0435 08/14/17 0435 08/15/17 0437  NA  --  140 135 139 137 136  K  --  5.0 4.7 4.3 3.5 3.2*  CL  --  104 102 102 96* 92*  CO2  --  24 22 23 27 30   GLUCOSE  --  119* 146* 113* 109* 111*  BUN  --  40* 41* 38* 38* 38*  CREATININE  --  2.39* 2.31* 1.98* 1.80* 1.69*  CALCIUM  --  8.8* 8.7* 9.1 9.1 9.1  MG 1.8  --   --    --   --   --   PHOS  --   --   --   --  2.7 2.8   GFR: Estimated Creatinine Clearance: 28.4 mL/min (A) (by C-G formula based on SCr of 1.69 mg/dL (H)). Liver Function Tests: Recent Labs  Lab 08/09/17 1848 08/10/17 0050 08/14/17 0435 08/15/17 0437  AST 15 14*  --   --   ALT 13* 11*  --   --   ALKPHOS 41 36*  --   --   BILITOT 0.6 0.7  --   --   PROT 7.8 7.2  --   --   ALBUMIN 3.3* 3.1* 3.2* 3.0*   No results for input(s): LIPASE, AMYLASE in the last 168 hours. No results for input(s): AMMONIA in the last 168 hours. Coagulation Profile: No results for input(s): INR, PROTIME in the last 168 hours. Cardiac Enzymes: Recent Labs  Lab 08/09/17 1848 08/10/17 0050 08/10/17 0655  TROPONINI 0.08* 0.08* 0.07*  BNP (last 3 results) No results for input(s): PROBNP in the last 8760 hours. HbA1C: No results for input(s): HGBA1C in the last 72 hours. CBG: Recent Labs  Lab 08/14/17 0742 08/14/17 1150 08/14/17 1652 08/14/17 2156 08/15/17 0728  GLUCAP 104* 173* 174* 169* 108*   Lipid Profile: No results for input(s): CHOL, HDL, LDLCALC, TRIG, CHOLHDL, LDLDIRECT in the last 72 hours. Thyroid Function Tests: No results for input(s): TSH, T4TOTAL, FREET4, T3FREE, THYROIDAB in the last 72 hours. Anemia Panel: No results for input(s): VITAMINB12, FOLATE, FERRITIN, TIBC, IRON, RETICCTPCT in the last 72 hours. Sepsis Labs: No results for input(s): PROCALCITON, LATICACIDVEN in the last 168 hours.  Recent Results (from the past 240 hour(s))  C difficile quick scan w PCR reflex     Status: None   Collection Time: 08/06/17 10:16 AM  Result Value Ref Range Status   C Diff antigen NEGATIVE NEGATIVE Final   C Diff toxin NEGATIVE NEGATIVE Final   C Diff interpretation No C. difficile detected.  Final    Comment: Performed at [facility], [place] [address], [place], [place] [address]  MRSA PCR Screening     Status: None   Collection Time: 08/09/17  8:00 PM  Result Value  Ref Range Status   MRSA by PCR NEGATIVE NEGATIVE Final    Comment:        The GeneXpert MRSA Assay (FDA approved for NASAL specimens only), is one component of a comprehensive MRSA colonization surveillance program. It is not intended to diagnose MRSA infection nor to guide or monitor treatment for MRSA infections. Performed at [facility], [place] [address] [place], [place] [address]          Radiology Studies: No results found.      Scheduled Meds: . amiodarone  200 mg Oral Daily  . apixaban  5 mg Oral BID  . carvedilol  12.5 mg Oral BID WC  . feeding supplement  1 Container Oral TID BM  . insulin aspart  0-5 Units Subcutaneous QHS  . insulin aspart  0-9 Units Subcutaneous TID WC  . isosorbide-hydrALAZINE  1 tablet Oral BID  . lactobacillus  1 g Oral TID WC  . levothyroxine  125 mcg Oral QAC breakfast  . metoCLOPramide (REGLAN) injection  5 mg Intravenous Q8H  . pantoprazole  40 mg Oral Daily  . potassium chloride  40 mEq Oral Once  . sertraline  50 mg Oral Daily   Continuous Infusions: . furosemide 160 mg (08/15/17 0913)     LOS: 15 days    Time spent: 35 minutes.     Hosie Poisson, MD [facility] Pager [phone]   If 7PM-7AM, please contact night-coverage [email or web address] Password [email or web address] 08/15/2017, 9:41 AM

## 2017-08-15 NOTE — Progress Notes (Signed)
Discussed disposition with attending, pt, and daughter. Pt had previously declined SNF placement planning for 24/7 caregivers at home, however today pt and daughter report they have reconsidered as pt continues to be recommended for SNF throughout her hospital stay. Request [facility] if available. CSW re-submitted PASSR request for SNF placement- went to manual review. Will follow up and provide additional information when requested. Pt will require approved PASSR prior to admitting to SNF. Pt's insurance requires [facility] Medicare prior-authorization for SNF - can be initiated once bed offered and facility selected.  Sharren Bridge, MSW, LCSW Clinical Social Work 08/15/2017 [phone]

## 2017-08-15 NOTE — Progress Notes (Signed)
Physical Therapy Treatment Patient Details Name: Morgan Clay MRN: [id number] DOB: 07-Jun-1945 Today's Date: 08/15/2017    History of Present Illness 72 yo female admitted [facility] on 07/[facility]/2017 with abdominal pain.  She is foind to have Acute sigmoid diverticulitis and enteritis.  She has been having pain and limited mobilty with frequent diarrhea     PT Comments    Pt in bed on 2 lts nasal.  97%.  Assisted OOB to [facility] for BM with increased time due to weakness and fatigue.  Assisted with peri care as pt was unable to self perform and maintain safe standing balance.  Pt required one sitting rest break after using BSC before amb could be attempted.  Pt amb on RA sats 95% and HR 71 a total of 15 feet with + 2 assist for safety such that recliner was following.    Follow Up Recommendations  SNF     Equipment Recommendations  Rolling walker with 5" wheels    Recommendations for Other Services       Precautions / Restrictions Precautions Precautions: Fall Precaution Comments: fatigues quickly, monitor sats Restrictions Weight Bearing Restrictions: No    Mobility  Bed Mobility Overal bed mobility: Needs Assistance Bed Mobility: Supine to Sit     Supine to sit: Min assist     General bed mobility comments: assisted B LE off bed due to "heavyness"  Transfers Overall transfer level: Needs assistance Equipment used: Rolling walker (2 wheeled) Transfers: Sit to/from [facility] Sit to Stand: Min assist Stand pivot transfers: Min assist       General transfer comment: Assisted from elevated bed to [facility] with 255 VC's on safety with turns and handposition   Ambulation/Gait Ambulation/Gait assistance: Min assist;+2 safety/equipment Ambulation Distance (Feet): 15 Feet Assistive device: Rolling walker (2 wheeled) Gait Pattern/deviations: Step-to pattern;Step-through pattern Gait velocity: decreased   General Gait Details: limited distance due to weakness and  fatigue.  Recliner following for safety.     Stairs            Wheelchair Mobility    Modified Rankin (Stroke Patients Only)       Balance                                            Cognition Arousal/Alertness: Awake/alert Behavior During Therapy: WFL for tasks assessed/performed Overall Cognitive Status: Within Functional Limits for tasks assessed                                 General Comments: feels tired       Exercises      General Comments        Pertinent Vitals/Pain Pain Assessment: No/denies pain Pain Location: no pain but legs feel heavy Pain Descriptors / Indicators: Tightness Pain Intervention(s): Monitored during session;Repositioned    Home Living                      Prior Function            PT Goals (current goals can now be found in the care plan section) Progress towards PT goals: Progressing toward goals    Frequency    Min 2X/week      PT Plan Current plan remains appropriate    Co-evaluation  AM-PAC PT "6 Clicks" Daily Activity  Outcome Measure  Difficulty turning over in bed (including adjusting bedclothes, sheets and blankets)?: A Lot Difficulty moving from lying on back to sitting on the side of the bed? : A Lot Difficulty sitting down on and standing up from a chair with arms (e.g., wheelchair, bedside commode, etc,.)?: A Lot Help needed moving to and from a bed to chair (including a wheelchair)?: A Lot Help needed walking in hospital room?: A Lot Help needed climbing 3-5 steps with a railing? : Total 6 Click Score: 11    End of Session Equipment Utilized During Treatment: Gait belt Activity Tolerance: Patient limited by fatigue Patient left: in chair;with call bell/phone within reach Nurse Communication: Mobility status PT Visit Diagnosis: Difficulty in walking, not elsewhere classified (R26.2);Muscle weakness (generalized) (M62.81)     Time:  1660-6004 PT Time Calculation (min) (ACUTE ONLY): 28 min  Charges:  $Gait Training: 8-22 mins $Therapeutic Activity: 8-22 mins                    G Codes:       Rica Koyanagi  PTA [facility]  Acute  Rehab Pager      [phone]

## 2017-08-16 ENCOUNTER — Inpatient Hospital Stay (HOSPITAL_COMMUNITY): Payer: Medicare Other

## 2017-08-16 LAB — RENAL FUNCTION PANEL
Albumin: 3 g/dL — ABNORMAL LOW (ref 3.5–5.0)
Anion gap: 16 — ABNORMAL HIGH (ref 5–15)
BUN: 34 mg/dL — AB (ref 6–20)
CHLORIDE: 89 mmol/L — AB (ref 101–111)
CO2: 32 mmol/L (ref 22–32)
CREATININE: 1.69 mg/dL — AB (ref 0.44–1.00)
Calcium: 9.2 mg/dL (ref 8.9–10.3)
GFR calc Af Amer: 35 mL/min — ABNORMAL LOW (ref >60)
GFR calc non Af Amer: 30 mL/min — ABNORMAL LOW (ref >60)
GLUCOSE: 101 mg/dL — AB (ref 65–99)
Phosphorus: 3.1 mg/dL (ref 2.5–4.6)
Potassium: 3.3 mmol/L — ABNORMAL LOW (ref 3.5–5.1)
Sodium: 137 mmol/L (ref 135–145)

## 2017-08-16 LAB — GLUCOSE, CAPILLARY
GLUCOSE-CAPILLARY: 113 mg/dL — AB (ref 65–99)
Glucose-Capillary: 94 mg/dL (ref 65–99)
Glucose-Capillary: 140 mg/dL — ABNORMAL HIGH (ref 65–99)

## 2017-08-16 MED ORDER — POTASSIUM CHLORIDE CRYS ER 20 MEQ PO TBCR
40.0000 meq | EXTENDED_RELEASE_TABLET | Freq: Two times a day (BID) | ORAL | Status: AC
  Administered 2017-08-16: 40 meq via ORAL
  Filled 2017-08-16 (×2): qty 2

## 2017-08-16 MED ORDER — DM-GUAIFENESIN ER 30-600 MG PO TB12
1.0000 | ORAL_TABLET | Freq: Two times a day (BID) | ORAL | Status: DC
Start: 2017-08-16 — End: 2017-08-17
  Administered 2017-08-16 – 2017-08-17 (×3): 1 via ORAL
  Filled 2017-08-16 (×3): qty 1

## 2017-08-16 MED ORDER — POTASSIUM CHLORIDE CRYS ER 20 MEQ PO TBCR
40.0000 meq | EXTENDED_RELEASE_TABLET | Freq: Once | ORAL | Status: AC
  Administered 2017-08-16: 40 meq via ORAL

## 2017-08-16 NOTE — Progress Notes (Signed)
[facility] Progress Note  Subjective: 3100 UOP- crt stable, SOB better , weight down again to 158 lbs- she feels better   Vitals:   08/15/17 1249 08/15/17 1254 08/15/17 2039 08/16/17 0506  BP: (!) 174/72  (!) 139/58 (!) 130/59  Pulse: 62  64 66  Resp: 18  18 20   Temp: 98.7 F (37.1 C)  98.6 F (37 C) 98.7 F (37.1 C)  TempSrc: Oral  Oral Oral  SpO2: 98%  100% 97%  Weight:  71.7 kg (158 lb 1.6 oz)    Height:        Inpatient medications: . amiodarone  200 mg Oral Daily  . apixaban  5 mg Oral BID  . carvedilol  12.5 mg Oral BID WC  . feeding supplement  1 Container Oral TID BM  . furosemide  40 mg Oral BID  . insulin aspart  0-5 Units Subcutaneous QHS  . insulin aspart  0-9 Units Subcutaneous TID WC  . isosorbide-hydrALAZINE  1 tablet Oral BID  . lactobacillus  1 g Oral TID WC  . levothyroxine  125 mcg Oral QAC breakfast  . metoCLOPramide (REGLAN) injection  5 mg Intravenous Q8H  . pantoprazole  40 mg Oral Daily  . sertraline  50 mg Oral Daily  . spironolactone  25 mg Oral Daily    acetaminophen **OR** acetaminophen, albuterol, hydrALAZINE, iopamidol, menthol-cetylpyridinium, ondansetron (ZOFRAN) IV, phenol, promethazine, traMADol, traZODone  Exam: Alert, nasal O2 Chest bilat rales 1/3 up, no ^wob Cor RRR Abd tense, nontender, +bs Ext diffuse moderate pitting edema bilat LE's up to hips +UE edema L > R facial edema NF, ox 3  CXR 4/3 worsening pulm edema   Impression: 1  AKI/ CKD3 - baseline creat 1.8 is at baseline, needs aggressive diuresis for gross vol overload- finally making progress.  Creatinine better than baseline at present 2  Vol overload / pulm edema/ decomp CHF - sig wt increase, poor UOP , diffuse edema, hx HFpEF; req lasix IV 160 q 6 and metolazone 5 BID transiently- better UOP-   Also on coreg and bidil, BP down- have decreased coreg so that BP does not get too low.  Says her usual weight is in the high 140's- still 158 as of 4/10 but  better.  Put her back to close on her home diuretic dose.  Normally 60mg  daily and aldactone 25- have done  40 BID and resumed aldactone.  I would wait on resuming entresto  3  Diverticulitis - sp 7 days iv abx 4  H/o HFpEF - as above 5  DM on insulin 6  Pain - she was having hallucinations/ slurred speech and asterixis, dc'd all narcotics , much better MS 7  Anemia - stable around 10- no meds 9  HTN on bidil/ coreg, lasix 10. Dispo- resumed  oral regimen today  in anticipation for home.  I will make sure I arrange follow up with [name] upon D/C which I think pt is very close to- if had bed later today I would not object to discharge, would just order renal panel early next week to be sent to [facility] fax [phone] - will write to D/C foley   11. Hypokalemia- agree with repletion- will give another 40 meq today - aldactone should help her hold onto K as well      Clark Clowdus A  08/16/2017, 7:27 AM   Recent Labs  Lab 08/14/17 0435 08/15/17 0437 08/16/17 0429  NA 137 136 137  K 3.5 3.2* 3.3*  CL 96* 92* 89*  CO2 27 30 32  GLUCOSE 109* 111* 101*  BUN 38* 38* 34*  CREATININE 1.80* 1.69* 1.69*  CALCIUM 9.1 9.1 9.2  PHOS 2.7 2.8 3.1   Recent Labs  Lab 08/09/17 1848 08/10/17 0050 08/14/17 0435 08/15/17 0437 08/16/17 0429  AST 15 14*  --   --   --   ALT 13* 11*  --   --   --   ALKPHOS 41 36*  --   --   --   BILITOT 0.6 0.7  --   --   --   PROT 7.8 7.2  --   --   --   ALBUMIN 3.3* 3.1* 3.2* 3.0* 3.0*   Recent Labs  Lab 08/10/17 0050 08/11/17 0319 08/13/17 0435  WBC 7.5 8.7 9.9  HGB 9.9* 10.3* 9.7*  HCT 29.7* 31.5* 30.1*  MCV 86.1 87.7 89.1  PLT 198 194 184   Iron/TIBC/Ferritin/ %Sat No results found for: IRON, TIBC, FERRITIN, IRONPCTSAT

## 2017-08-16 NOTE — Progress Notes (Signed)
Assisting with disposition- met with pt today and still planning for pt to admit to [facility] for [facility] at [place]. Facility initiated [employer] authorization. PASRR in manual review- CSW provided information requested to [facility] Must and awaiting review- pt will need PASRR approved prior to admitting to SNF.  Sharren Bridge, MSW, LCSW Clinical Social Work 08/16/2017 [phone]

## 2017-08-16 NOTE — Progress Notes (Signed)
PROGRESS NOTE    Morgan Clay  ZHY:[id number] DOB: 01-11-1946 DOA: 07/31/2017 PCP: [name], DO   Brief Narrative:  Morgan Clay a 72 y.o.femalewith medical history significantfor NICM (last EF 45%) s/pCRT-D, A. fib on Eliquis with recent DCCV on 07/24/17, OSA on CPAP, HCV status post Harvoni (viremic response not documented), hypothyroidism, depression and diet managed type 2 diabetes mellitus who presents to the ED with 5 days of diffuse, generalized abdominal pain, with associated anorexia, nausea and one episode of profuse nonbloody, nonbilious emesis on the day of presentation. CT abdomen and pelvis without contrast showed Suspected mild acute diverticulitis involving the proximal sigmoid colon. Scattered small bowel air-fluid levels and with questionable wall thickening of a few left-sided small bowel loops, query enteritis.  Admitted on 07/31/2017 with the above findings with concerns about possible partial SBO versus diverticulitis versus colitis as well as concerns about possible pneumonia.  Clinically and radiologically patient found to be in pulmonary edema, responded well to high-dose Lasix     Assessment & Plan:   Principal Problem:   Pulmonary edema, acute (HCC) Active Problems:   Hypothyroidism   Obesity   OBSTRUCTIVE SLEEP APNEA   Type 2 diabetes, controlled, with renal manifestation (HCC)   Chronic kidney disease - followed by [facility]   Chronic obstructive pulmonary disease ([place])   Hypertension associated with diabetes ([place])   Persistent atrial fibrillation (HCC)   Diverticulitis   ARF (acute renal failure) (HCC)   Enteritis   Partial small bowel obstruction (HCC)   Pain and swelling of left upper extremity   Acute sigmoid diverticulitis/enteritis Patient has completed the course of IV Zosyn.  Currently she is on regular diet and is able to tolerate without nausea and vomiting.  She continues to have bowel movements frequently.   Abdominal pain has improved.   Small bowel obstruction Resolved.   COPD Stable no wheezing heard.   Pain and swelling of the left upper per extremity Venous duplex ruled out DVT soft tissue ultrasound showed subcutaneous edema.  Recommend elevation of the arm.  Discussed with the patient and the nurse. Left upper extremity edema has improved. Pain is resolved.   Acute on stage III CKD Baseline creatinine at 1.8, creatinine on admission was 2.1 and it peaked at 3.7.  Nephrology was consulted and her creatinine seems to be improving. Pt responding appropriately well to IV lasix and diuresed about 3.2 lit last 24 hours and her creatinine is at 1.6. Changed to oral lasix and started the patient on spironolactone.  No change in meds.  pts creatinine stable at 1.6.  Acute on chronic combined systolic and diastolic heart failure exacerbation on 4/4 Patient was transferred to telemetry.  An echocardiogram was obtained showed an improvement of left ventricular EF to 60% with grade 2 diastolic dysfunction. Cardiology was consulted and recommendations given Back on oral lasix  And spironolactone.  Meanwhile Coreg, BiDil resumed.  Currently waiting for placement.     Chronic atrial fibrillation rate controlled on amiodarone and Coreg and Eliquis for anticoagulation  Hypothyroidism resume Synthroid  Review of hepatitis C status post Harwani Outpatient follow-up with ID as needed.  Type 2 diabetes mellitus  CBG (last 3)  Recent Labs    08/16/17 0748 08/16/17 1143 08/16/17 1553  GLUCAP 94 140* 113*   A1c of 6.8, resume SSI. No change in meds.    Mild normocytic anemia no signs of bleeding.  Cough: Repeat CXR shows improvement in aeration.  Started her on dextromethorphan.  DVT prophylaxis: Eliquis Code Status: Full code Family Communication: None at bedside today Disposition Plan: SNF IN 1 day.   Consultants:   Nephrology  Cardiology, surgery   Procedures:  Echocardiogram  Antimicrobials: None  Subjective: COUGH since morning. No chest pain or sob.    Objective: Vitals:   08/15/17 1254 08/15/17 2039 08/16/17 0506 08/16/17 1100  BP:  (!) 139/58 (!) 130/59   Pulse:  64 66   Resp:  18 20   Temp:  98.6 F (37 C) 98.7 F (37.1 C)   TempSrc:  Oral Oral   SpO2:  100% 97% 93%  Weight: 71.7 kg (158 lb 1.6 oz)     Height:        Intake/Output Summary (Last 24 hours) at 08/16/2017 1946 Last data filed at 08/16/2017 0554 Gross per 24 hour  Intake -  Output 1300 ml  Net -1300 ml   Filed Weights   08/12/17 0416 08/13/17 0433 08/15/17 1254  Weight: 80.4 kg (177 lb 4 oz) 78.2 kg (172 lb 6.4 oz) 71.7 kg (158 lb 1.6 oz)    Examination:   General exam:calm and comfortable. Not in distress.  Respiratory system: air entry fair, no wheezing or rhonchi.  Cardiovascular system: S1 & S2 heard, RRR. No JVD, murmurs, . 1 Plus pedal edema. Gastrointestinal system: Abdomen is soft non tender non distended bowel sounds heard. Central nervous system: Alert and oriented. Non focal.  Extremities: Left upper extremity 2+ edema, improving.  Skin: No rashes, lesions or ulcers Psychiatry:  Mood & affect appropriate.     Data Reviewed: I have personally reviewed following labs and imaging studies  CBC: Recent Labs  Lab 08/10/17 0050 08/11/17 0319 08/13/17 0435  WBC 7.5 8.7 9.9  HGB 9.9* 10.3* 9.7*  HCT 29.7* 31.5* 30.1*  MCV 86.1 87.7 89.1  PLT 198 194 235   Basic Metabolic Panel: Recent Labs  Lab 08/10/17 0655  08/12/17 0439 08/13/17 0435 08/14/17 0435 08/15/17 0437 08/16/17 0429  NA  --    < > 135 139 137 136 137  K  --    < > 4.7 4.3 3.5 3.2* 3.3*  CL  --    < > 102 102 96* 92* 89*  CO2  --    < > 22 23 27 30  32  GLUCOSE  --    < > 146* 113* 109* 111* 101*  BUN  --    < > 41* 38* 38* 38* 34*  CREATININE  --    < > 2.31* 1.98* 1.80* 1.69* 1.69*  CALCIUM  --    < > 8.7* 9.1 9.1 9.1 9.2  MG 1.8  --   --   --   --   --   --   PHOS   --   --   --   --  2.7 2.8 3.1   < > = values in this interval not displayed.   GFR: Estimated Creatinine Clearance: 27.1 mL/min (A) (by C-G formula based on SCr of 1.69 mg/dL (H)). Liver Function Tests: Recent Labs  Lab 08/10/17 0050 08/14/17 0435 08/15/17 0437 08/16/17 0429  AST 14*  --   --   --   ALT 11*  --   --   --   ALKPHOS 36*  --   --   --   BILITOT 0.7  --   --   --   PROT 7.2  --   --   --   ALBUMIN 3.1* 3.2*  3.0* 3.0*   No results for input(s): LIPASE, AMYLASE in the last 168 hours. No results for input(s): AMMONIA in the last 168 hours. Coagulation Profile: No results for input(s): INR, PROTIME in the last 168 hours. Cardiac Enzymes: Recent Labs  Lab 08/10/17 0050 08/10/17 0655  TROPONINI 0.08* 0.07*   BNP (last 3 results) No results for input(s): PROBNP in the last 8760 hours. HbA1C: No results for input(s): HGBA1C in the last 72 hours. CBG: Recent Labs  Lab 08/15/17 1743 08/15/17 2342 08/16/17 0748 08/16/17 1143 08/16/17 1553  GLUCAP 119* 101* 94 140* 113*   Lipid Profile: No results for input(s): CHOL, HDL, LDLCALC, TRIG, CHOLHDL, LDLDIRECT in the last 72 hours. Thyroid Function Tests: No results for input(s): TSH, T4TOTAL, FREET4, T3FREE, THYROIDAB in the last 72 hours. Anemia Panel: No results for input(s): VITAMINB12, FOLATE, FERRITIN, TIBC, IRON, RETICCTPCT in the last 72 hours. Sepsis Labs: No results for input(s): PROCALCITON, LATICACIDVEN in the last 168 hours.  Recent Results (from the past 240 hour(s))  MRSA PCR Screening     Status: None   Collection Time: 08/09/17  8:00 PM  Result Value Ref Range Status   MRSA by PCR NEGATIVE NEGATIVE Final    Comment:        The GeneXpert MRSA Assay (FDA approved for NASAL specimens only), is one component of a comprehensive MRSA colonization surveillance program. It is not intended to diagnose MRSA infection nor to guide or monitor treatment for MRSA infections. Performed at [facility], [place] [address] [place], [place] [address]          Radiology Studies: Dg Chest 2 View  Result Date: 08/16/2017 CLINICAL DATA:  Cough. EXAM: CHEST - 2 VIEW COMPARISON:  Chest x-ray dated August 13, 2017. FINDINGS: Unchanged left chest wall AICD. Stable cardiomegaly. Interstitial and alveolar edema may be slightly improved. Decrease in size of small right pleural effusion. No pneumothorax. No acute osseous abnormality. IMPRESSION: 1. Slightly improved pulmonary edema and small right pleural effusion. Electronically Signed   By: Titus Dubin M.D.   On: 08/16/2017 15:34        Scheduled Meds: . amiodarone  200 mg Oral Daily  . apixaban  5 mg Oral BID  . carvedilol  12.5 mg Oral BID WC  . dextromethorphan-guaiFENesin  1 tablet Oral BID  . feeding supplement  1 Container Oral TID BM  . furosemide  40 mg Oral BID  . insulin aspart  0-5 Units Subcutaneous QHS  . insulin aspart  0-9 Units Subcutaneous TID WC  . isosorbide-hydrALAZINE  1 tablet Oral BID  . lactobacillus  1 g Oral TID WC  . levothyroxine  125 mcg Oral QAC breakfast  . metoCLOPramide (REGLAN) injection  5 mg Intravenous Q8H  . pantoprazole  40 mg Oral Daily  . potassium chloride  40 mEq Oral BID  . sertraline  50 mg Oral Daily  . spironolactone  25 mg Oral Daily   Continuous Infusions:    LOS: 16 days    Time spent: 35 minutes.     Hosie Poisson, MD [facility] Pager [phone]   If 7PM-7AM, please contact night-coverage [email or web address] Password [email or web address] 08/16/2017, 7:46 PM

## 2017-08-17 DIAGNOSIS — E1129 Type 2 diabetes mellitus with other diabetic kidney complication: Secondary | ICD-10-CM | POA: Diagnosis not present

## 2017-08-17 DIAGNOSIS — K5720 Diverticulitis of large intestine with perforation and abscess without bleeding: Secondary | ICD-10-CM | POA: Diagnosis not present

## 2017-08-17 DIAGNOSIS — M79602 Pain in left arm: Secondary | ICD-10-CM

## 2017-08-17 DIAGNOSIS — J810 Acute pulmonary edema: Secondary | ICD-10-CM | POA: Diagnosis not present

## 2017-08-17 DIAGNOSIS — M6281 Muscle weakness (generalized): Secondary | ICD-10-CM | POA: Diagnosis not present

## 2017-08-17 DIAGNOSIS — E8770 Fluid overload, unspecified: Secondary | ICD-10-CM | POA: Diagnosis not present

## 2017-08-17 DIAGNOSIS — M7989 Other specified soft tissue disorders: Secondary | ICD-10-CM

## 2017-08-17 DIAGNOSIS — I420 Dilated cardiomyopathy: Secondary | ICD-10-CM | POA: Diagnosis not present

## 2017-08-17 DIAGNOSIS — K5792 Diverticulitis of intestine, part unspecified, without perforation or abscess without bleeding: Secondary | ICD-10-CM | POA: Diagnosis not present

## 2017-08-17 DIAGNOSIS — J449 Chronic obstructive pulmonary disease, unspecified: Secondary | ICD-10-CM | POA: Diagnosis not present

## 2017-08-17 DIAGNOSIS — I481 Persistent atrial fibrillation: Secondary | ICD-10-CM | POA: Diagnosis not present

## 2017-08-17 DIAGNOSIS — E1122 Type 2 diabetes mellitus with diabetic chronic kidney disease: Secondary | ICD-10-CM | POA: Diagnosis not present

## 2017-08-17 DIAGNOSIS — N179 Acute kidney failure, unspecified: Secondary | ICD-10-CM | POA: Diagnosis not present

## 2017-08-17 DIAGNOSIS — R100 Acute abdomen: Secondary | ICD-10-CM | POA: Diagnosis not present

## 2017-08-17 DIAGNOSIS — K529 Noninfective gastroenteritis and colitis, unspecified: Secondary | ICD-10-CM | POA: Diagnosis not present

## 2017-08-17 DIAGNOSIS — N170 Acute kidney failure with tubular necrosis: Secondary | ICD-10-CM | POA: Diagnosis not present

## 2017-08-17 DIAGNOSIS — N183 Chronic kidney disease, stage 3 (moderate): Secondary | ICD-10-CM | POA: Diagnosis not present

## 2017-08-17 DIAGNOSIS — R1314 Dysphagia, pharyngoesophageal phase: Secondary | ICD-10-CM | POA: Diagnosis not present

## 2017-08-17 DIAGNOSIS — R262 Difficulty in walking, not elsewhere classified: Secondary | ICD-10-CM | POA: Diagnosis not present

## 2017-08-17 LAB — RENAL FUNCTION PANEL
Albumin: 3.1 g/dL — ABNORMAL LOW (ref 3.5–5.0)
Anion gap: 14 (ref 5–15)
BUN: 34 mg/dL — ABNORMAL HIGH (ref 6–20)
CALCIUM: 9.1 mg/dL (ref 8.9–10.3)
CO2: 33 mmol/L — ABNORMAL HIGH (ref 22–32)
CREATININE: 1.62 mg/dL — AB (ref 0.44–1.00)
Chloride: 89 mmol/L — ABNORMAL LOW (ref 101–111)
GFR, EST AFRICAN AMERICAN: 36 mL/min — AB (ref >60)
GFR, EST NON AFRICAN AMERICAN: 31 mL/min — AB (ref >60)
Glucose, Bld: 101 mg/dL — ABNORMAL HIGH (ref 65–99)
PHOSPHORUS: 2.9 mg/dL (ref 2.5–4.6)
Potassium: 4.1 mmol/L (ref 3.5–5.1)
SODIUM: 136 mmol/L (ref 135–145)

## 2017-08-17 LAB — GLUCOSE, CAPILLARY
GLUCOSE-CAPILLARY: 129 mg/dL — AB (ref 65–99)
GLUCOSE-CAPILLARY: 123 mg/dL — AB (ref 65–99)
Glucose-Capillary: 186 mg/dL — ABNORMAL HIGH (ref 65–99)

## 2017-08-17 MED ORDER — GERHARDT'S BUTT CREAM
TOPICAL_CREAM | Freq: Two times a day (BID) | CUTANEOUS | Status: DC
  Filled 2017-08-17: qty 1

## 2017-08-17 MED ORDER — CARVEDILOL 12.5 MG PO TABS: 12.5000 mg | ORAL_TABLET | Freq: Two times a day (BID) | ORAL | 0 refills | Status: DC

## 2017-08-17 MED ORDER — FUROSEMIDE 40 MG PO TABS: 40.0000 mg | ORAL_TABLET | Freq: Two times a day (BID) | ORAL | 0 refills | Status: DC

## 2017-08-17 MED ORDER — PANTOPRAZOLE SODIUM 40 MG PO TBEC: 40.0000 mg | DELAYED_RELEASE_TABLET | Freq: Every day | ORAL | 0 refills | Status: DC

## 2017-08-17 MED ORDER — GERHARDT'S BUTT CREAM: 1.0000 "application " | TOPICAL_CREAM | Freq: Two times a day (BID) | CUTANEOUS | 2 refills | Status: DC

## 2017-08-17 MED ORDER — TRAMADOL HCL 50 MG PO TABS: 100.0000 mg | ORAL_TABLET | Freq: Two times a day (BID) | ORAL | 0 refills | Status: DC | PRN

## 2017-08-17 NOTE — Progress Notes (Signed)
[facility] Progress Note  Subjective: UOP not well recorded- crt stable,  weight down again to 156 lbs- for discharge today    Vitals:   08/16/17 2114 08/17/17 0445 08/17/17 1021 08/17/17 1155  BP: 119/62 129/61    Pulse: (!) 59 69  73  Resp: 20 20    Temp: 98.3 F (36.8 C) 98.6 F (37 C)    TempSrc: Oral     SpO2: 100% 100%  100%  Weight:   70.9 kg (156 lb 4.9 oz)   Height:        Inpatient medications: . amiodarone  200 mg Oral Daily  . apixaban  5 mg Oral BID  . carvedilol  12.5 mg Oral BID WC  . dextromethorphan-guaiFENesin  1 tablet Oral BID  . furosemide  40 mg Oral BID  . Gerhardt's butt cream   Topical BID  . insulin aspart  0-5 Units Subcutaneous QHS  . insulin aspart  0-9 Units Subcutaneous TID WC  . isosorbide-hydrALAZINE  1 tablet Oral BID  . lactobacillus  1 g Oral TID WC  . levothyroxine  125 mcg Oral QAC breakfast  . metoCLOPramide (REGLAN) injection  5 mg Intravenous Q8H  . pantoprazole  40 mg Oral Daily  . sertraline  50 mg Oral Daily  . spironolactone  25 mg Oral Daily    acetaminophen **OR** acetaminophen, albuterol, hydrALAZINE, iopamidol, menthol-cetylpyridinium, ondansetron (ZOFRAN) IV, phenol, promethazine, traMADol, traZODone  Exam: Alert, nasal O2 Chest bilat rales 1/3 up, no ^wob Cor RRR Abd tense, nontender, +bs Ext diffuse moderate pitting edema bilat LE's up to hips +UE edema L > R facial edema NF, ox 3  CXR 4/3 worsening pulm edema   Impression: 1  AKI/ CKD3 - baseline creat 1.8 is at baseline, needed aggressive diuresis for gross vol overload-   Creatinine better than baseline at present 2  Vol overload / pulm edema/ decomp CHF - sig wt increase, poor UOP , diffuse edema, hx HFpEF; req lasix IV 160 q 6 and metolazone 5 BID transiently- better UOP-   Also on coreg and bidil, BP down- have decreased coreg so that BP does not get too low.  Says her usual weight is in the high 140's- still 156 as of today.  Put her back  to close on her home diuretic dose.  Normally 60mg  daily and aldactone 25- have done  40 BID and resumed aldactone.  I would wait on resuming entresto  3  Diverticulitis - sp 7 days iv abx 4  H/o HFpEF - as above 5  DM on insulin 6  Pain - she was having hallucinations/ slurred speech and asterixis, dc'd all narcotics , much better MS 7  Anemia - stable around 10- no meds 9  HTN on bidil/ coreg, lasix 10. Dispo- resumed  oral regimen  in anticipation for home- for discharge today.  I will make sure I arrange follow up with [name] will just order renal panel in 1-2 weeks thru [facility] 11. Hypokalemia- agree with repletion- - aldactone should help her hold onto K as well - to not be sent out on any repletion     Raimi Guillermo A  08/17/2017, 12:53 PM   Recent Labs  Lab 08/15/17 0437 08/16/17 0429 08/17/17 0421  NA 136 137 136  K 3.2* 3.3* 4.1  CL 92* 89* 89*  CO2 30 32 33*  GLUCOSE 111* 101* 101*  BUN 38* 34* 34*  CREATININE 1.69* 1.69* 1.62*  CALCIUM 9.1 9.2  9.1  PHOS 2.8 3.1 2.9   Recent Labs  Lab 08/15/17 0437 08/16/17 0429 08/17/17 0421  ALBUMIN 3.0* 3.0* 3.1*   Recent Labs  Lab 08/11/17 0319 08/13/17 0435  WBC 8.7 9.9  HGB 10.3* 9.7*  HCT 31.5* 30.1*  MCV 87.7 89.1  PLT 194 184   Iron/TIBC/Ferritin/ %Sat No results found for: IRON, TIBC, FERRITIN, IRONPCTSAT

## 2017-08-17 NOTE — Consult Note (Signed)
[place] Nurse wound consult note Reason for Consult:Moisutre associated skin damage.  Excessive diarrhea.  Wound type:MASD to buttocks and posterior thighs Pressure Injury POA: NA Measurement: Bilateral gluteal folds 9 cm x 2.2 cm x 0.1 cm peeling epithelium Wound PFX:TKWI and moist Drainage (amount, consistency, odor) scant weeping Periwound:dry skin Dressing procedure/placement/frequency:Cleanse buttocks with soap and water.  Apply Gerhardts butt paste twice daily.  Will not follow at this time.  Please re-consult if needed.  Domenic Moras RN BSN [place] Pager [phone]

## 2017-08-17 NOTE — Progress Notes (Signed)
Nutrition Brief Note  Patient screened for LOS 17 days.  Spoke with pt at bedside who denies any nutrition-related issues. Pt would like to stop receiving Boost Breeze as she has been having diarrhea that she attributes to the supplement. RD noted 4 unopened Boost Breeze containers in room.  Pt states her appetite is "in the middle" and denies recent weight loss. Confirmed per weight history in chart.  Pt would not like to receive other supplements at this time.  Wt Readings from Last 15 Encounters:  08/17/17 156 lb 4.9 oz (70.9 kg)  07/19/17 151 lb (68.5 kg)  06/26/17 150 lb (68 kg)  06/26/17 150 lb (68 kg)  06/13/17 153 lb (69.4 kg)  06/07/17 149 lb 11.2 oz (67.9 kg)  05/22/17 152 lb (68.9 kg)  03/22/17 151 lb 12.8 oz (68.9 kg)  03/20/17 150 lb 12.8 oz (68.4 kg)  03/06/17 153 lb 3.2 oz (69.5 kg)  03/01/17 158 lb 12.8 oz (72 kg)  02/22/17 155 lb 6.4 oz (70.5 kg)  01/24/17 148 lb (67.1 kg)  12/28/16 150 lb 8 oz (68.3 kg)  09/26/16 150 lb 1.6 oz (68.1 kg)    Body mass index is 31.57 kg/m. Patient meets criteria for Obesity, Class I based on current BMI.   Current diet order is low sodium Heart Healthy, patient is consuming approximately 75% of meals at this time. Labs and medications reviewed.   No nutrition interventions warranted at this time. If nutrition issues arise, please consult RD.   Gaynell Face, MS, RD, LDN Pager: [phone] Weekend/After Hours: [phone]

## 2017-08-17 NOTE — Progress Notes (Signed)
Physical Therapy Treatment Patient Details Name: Morgan Clay MRN: [id number] DOB: 1946-03-13 Today's Date: 08/17/2017    History of Present Illness 72 yo female admitted to [facility] on 07/31/2017 with abdominal pain.  She is foind to have Acute sigmoid diverticulitis and enteritis.  She has been having pain and limited mobilty with frequent diarrhea     PT Comments    Pt c/o MAX fatigue with several days loose stools.  Assisted OOB to amb a limited distance with walker.  Unsteady and weak.  Recliner following for safety.  Pt will need ST Rehab at SNF prior to returning home.   Follow Up Recommendations  SNF     Equipment Recommendations  Rolling walker with 5" wheels    Recommendations for Other Services       Precautions / Restrictions Precautions Precautions: Fall Precaution Comments: monitor sats Restrictions Weight Bearing Restrictions: No    Mobility  Bed Mobility Overal bed mobility: Needs Assistance Bed Mobility: Supine to Sit     Supine to sit: Min assist     General bed mobility comments: assisted B LE off bed due to "heavyness"  Transfers Overall transfer level: Needs assistance Equipment used: Rolling walker (2 wheeled) Transfers: Sit to/from [employer] Transfers   Stand pivot transfers: Min assist       General transfer comment: assisted from elevated bed to standing with walker  Ambulation/Gait Ambulation/Gait assistance: Min assist Ambulation Distance (Feet): 38 Feet Assistive device: Rolling walker (2 wheeled) Gait Pattern/deviations: Step-to pattern;Step-through pattern Gait velocity: decreased   General Gait Details: limited distance due to weakness and fatigue.  Recliner following for safety.     Stairs             Wheelchair Mobility    Modified Rankin (Stroke Patients Only)       Balance                                            Cognition Arousal/Alertness: Awake/alert Behavior During  Therapy: WFL for tasks assessed/performed Overall Cognitive Status: Within Functional Limits for tasks assessed                                 General Comments: feels tired and low energy      Exercises      General Comments        Pertinent Vitals/Pain Pain Assessment: No/denies pain    Home Living                      Prior Function            PT Goals (current goals can now be found in the care plan section) Progress towards PT goals: Progressing toward goals    Frequency    Min 2X/week      PT Plan Current plan remains appropriate    Co-evaluation              AM-PAC PT "6 Clicks" Daily Activity  Outcome Measure  Difficulty turning over in bed (including adjusting bedclothes, sheets and blankets)?: A Lot Difficulty moving from lying on back to sitting on the side of the bed? : A Lot Difficulty sitting down on and standing up from a chair with arms (e.g., wheelchair, bedside commode, etc,.)?: A Lot Help needed moving to  and from a bed to chair (including a wheelchair)?: A Lot Help needed walking in hospital room?: A Lot Help needed climbing 3-5 steps with a railing? : Total 6 Click Score: 11    End of Session Equipment Utilized During Treatment: Gait belt Activity Tolerance: Patient limited by fatigue Patient left: in chair;with call bell/phone within reach Nurse Communication: Mobility status PT Visit Diagnosis: Difficulty in walking, not elsewhere classified (R26.2);Muscle weakness (generalized) (M62.81)     Time: 3546-5681 PT Time Calculation (min) (ACUTE ONLY): 25 min  Charges:  $Gait Training: 8-22 mins $Therapeutic Activity: 8-22 mins                    G Codes:       Rica Koyanagi  PTA [facility]  Acute  Rehab Pager      [phone]

## 2017-08-17 NOTE — Discharge Summary (Addendum)
Physician Discharge Summary  Morgan Clay SAY:[id number] DOB: May 28, 1945 DOA: 07/31/2017  PCP: [name], DO  Admit date: 07/31/2017 Discharge date: 08/17/2017  Admitted From: Home.  Disposition:  SNF  Recommendations for Outpatient Follow-up:  1. Follow up with PCP in 1-2 weeks 2. Please obtain BMP/CBC in one week 3. Please follow up with cardiology and nephrology in 1 to 2 weeks.    Equipment/Devices: oxygen 2 lit   Discharge Condition:stable.  CODE STATUS: full code.  Diet recommendation: Heart Healthy   Brief/Interim Summary: Morgan Clay a 72 y.o.femalewith medical history significantfor NICM (last EF 45%) s/pCRT-D, A. fib on Eliquis with recent DCCV on 07/24/17, OSA on CPAP, HCV status post Harvoni (viremic response not documented), hypothyroidism, depression and diet managed type 2 diabetes mellitus who presents to the ED with 5 days of diffuse, generalized abdominal pain, with associated anorexia, nausea and one episode of profuse nonbloody, nonbilious emesis on the day of presentation. CT abdomen and pelvis without contrast showed Suspected mild acute diverticulitis involving the proximal sigmoid colon. Scattered small bowel air-fluid levels and with questionable wall thickening of a few left-sided small bowel loops, query enteritis. Admitted on 07/31/2017 with the above findings with concerns about possible partial SBO versus diverticulitis versus colitis as well as concerns about possible pneumonia.Clinically and radiologically patient found to be in pulmonary edema, responded well to high-dose Lasix    Discharge Diagnoses:  Principal Problem:   Pulmonary edema, acute (HCC) Active Problems:   Hypothyroidism   Obesity   OBSTRUCTIVE SLEEP APNEA   Type 2 diabetes, controlled, with renal manifestation (HCC)   Chronic kidney disease - followed by [facility]   Chronic obstructive pulmonary disease ([place])   Hypertension associated with diabetes ([place])    Persistent atrial fibrillation (HCC)   Diverticulitis   ARF (acute renal failure) (HCC)   Enteritis   Partial small bowel obstruction (HCC)   Pain and swelling of left upper extremity   Acute sigmoid diverticulitis/enteritis Patient has completed the course of IV Zosyn.  Currently she is on regular diet and is able to tolerate without nausea and vomiting.  She continues to have bowel movements frequently.  Abdominal pain has improved. Pt has soreness, raw erythematous skin on the buttocks from diarrhea in the past week. Wound care consulted for recommendations.    Small bowel obstruction Resolved.   COPD Stable no wheezing heard.   Pain and swelling of the left upper per extremity Venous duplex ruled out DVT soft tissue ultrasound showed subcutaneous edema.  Recommend elevation of the arm.  Discussed with the patient and the nurse. Left upper extremity edema has improved. Pain is resolved.   Acute on stage III CKD Baseline creatinine at 1.8, creatinine on admission was 2.1 and it peaked at 3.7.  Nephrology was consulted and her creatinine seems to be improving. Pt responding appropriately well to IV lasix and diuresed about 3.2 lit last 24 hours and her creatinine is at 1.6. Changed to oral lasix and started the patient on spironolactone.  No change in meds.  pts creatinine stable at 1.6.  Acute on chronic combined systolic and diastolic heart failure exacerbation on 4/4 Patient was transferred to telemetry.  An echocardiogram was obtained showed an improvement of left ventricular EF to 60% with grade 2 diastolic dysfunction. Cardiology was consulted and recommendations given Back on oral lasix  , on discharge added bidil and spironolactone, in addition to coreg.     Chronic atrial fibrillation rate controlled on amiodarone and Coreg  and Eliquis for anticoagulation  Hypothyroidism resume Synthroid  Review of hepatitis C status post Harwani Outpatient follow-up  with ID as needed.  Type 2 diabetes mellitus  CBG (last 3)  Recent Labs    08/16/17 2259 08/17/17 0741 08/17/17 1113  GLUCAP 129* 123* 186*     A1c of 6.8, resume SSI. No change in meds.    Mild normocytic anemia no signs of bleeding.    Cough: Repeat CXR shows improvement in aeration.  Started her on dextromethorphan.         Discharge Instructions  Discharge Instructions    Diet - low sodium heart healthy   Complete by:  As directed    Discharge instructions   Complete by:  As directed    Follow up with cardiology and nephrology as recommended in 1 to 2 weeks.     Allergies as of 08/17/2017   No Known Allergies     Medication List    STOP taking these medications   ENTRESTO 97-103 MG Generic drug:  sacubitril-valsartan   fluticasone 50 MCG/ACT nasal spray Commonly known as:  FLONASE     TAKE these medications   albuterol 108 (90 Base) MCG/ACT inhaler Commonly known as:  PROVENTIL HFA;VENTOLIN HFA Inhale 2 puffs into the lungs every 6 (six) hours as needed for wheezing or shortness of breath.   amiodarone 200 MG tablet Commonly known as:  PACERONE 2/20-2/26 take 2 tablets, 400mg  twice/day 2/27-3/19- take 1 tablet, 200mg  twice/day March 20- one tablet, 200mg  daily What changed:    how much to take  how to take this  when to take this  additional instructions   BIDIL 20-37.5 MG tablet Generic drug:  isosorbide-hydrALAZINE TAKE 1 TABLET BY MOUTH TWICE DAILY What changed:    how much to take  how to take this  when to take this   carvedilol 12.5 MG tablet Commonly known as:  COREG Take 1 tablet (12.5 mg total) by mouth 2 (two) times daily with a meal. What changed:    medication strength  how much to take  when to take this   ELIQUIS 5 MG Tabs tablet Generic drug:  apixaban TAKE ONE TABLET BY MOUTH TWICE DAILY   furosemide 40 MG tablet Commonly known as:  LASIX Take 1 tablet (40 mg total) by mouth 2 (two) times  daily. What changed:    how much to take  when to take this   Gerhardt's butt cream Crea Apply 1 application topically 2 (two) times daily.   levothyroxine 125 MCG tablet Commonly known as:  SYNTHROID, LEVOTHROID Take 1 tablet (125 mcg total) by mouth daily. What changed:  when to take this   nitroGLYCERIN 0.4 MG SL tablet Commonly known as:  NITROSTAT Place 0.4 mg under the tongue every 5 (five) minutes as needed for chest pain.   pantoprazole 40 MG tablet Commonly known as:  PROTONIX Take 1 tablet (40 mg total) by mouth daily. Start taking on:  08/18/2017   sertraline 50 MG tablet Commonly known as:  ZOLOFT Take 1 tablet (50 mg total) by mouth daily.   spironolactone 25 MG tablet Commonly known as:  ALDACTONE Take 1 tablet (25 mg total) daily by mouth.   traMADol 50 MG tablet Commonly known as:  ULTRAM Take 2 tablets (100 mg total) by mouth every 12 (twelve) hours as needed (mild pain. use Tylenol first).   traZODone 50 MG tablet Commonly known as:  DESYREL Take 1 tablet (50 mg total) by mouth  at bedtime. What changed:    when to take this  reasons to take this   VISINE OP Place 1 drop into both eyes daily as needed (DRY EYES).            Durable Medical Equipment  (From admission, onward)        Start     Ordered   08/09/17 1250  For home use only DME Walker rolling  Once    Question:  Patient needs a walker to treat with the following condition  Answer:  Weakness   08/09/17 1251      Contact information for follow-up providers    [facility] Go on 08/20/2017.   Specialty:  General Surgery Why:  at 1:30 PM for post-operative follow up. please arrive by 1:00 PM to get checked in and fill out any necessary paperwork. Contact information: [facility].   Specialty:  Home Health Services Contact  information: [address] [place] [address] [phone]            Contact information for after-discharge care    Destination    [facility] .   Service:  Skilled Nursing Contact information: [place] [phone]                 No Known Allergies  Consultations:  Nephrology  Cardiology  surgery   Procedures/Studies: Ct Abdomen Pelvis Wo Contrast  Result Date: 08/03/2017 CLINICAL DATA:  Lower abdominal pain. EXAM: CT ABDOMEN AND PELVIS WITHOUT CONTRAST TECHNIQUE: Multidetector CT imaging of the abdomen and pelvis was performed following the standard protocol without IV contrast. COMPARISON:  CT scan of July 31, 2017. FINDINGS: Lower chest: Mild bibasilar subsegmental atelectasis or pneumonia is noted. Hepatobiliary: No gallstones are noted. No focal hepatic lesion is noted. Mildly nodular hepatic margins are noted suggesting cirrhosis. Pancreas: Unremarkable. No pancreatic ductal dilatation or surrounding inflammatory changes. Spleen: Normal in size without focal abnormality. Adrenals/Urinary Tract: Adrenal glands are unremarkable. Kidneys are normal, without renal calculi, focal lesion, or hydronephrosis. Bladder is unremarkable. Stomach/Bowel: The stomach appears normal. Moderate small bowel dilatation is noted concerning for ileus or distal small bowel obstruction. Proximal sigmoid diverticulitis is again noted. Vascular/Lymphatic: Aortic atherosclerosis. No enlarged abdominal or pelvic lymph nodes. Reproductive: Large calcified uterine fibroid is noted. No adnexal abnormality is noted. Other: No abnormal fluid collection is noted. Mild fluid-filled left inguinal hernia is noted. Musculoskeletal: No acute or significant osseous findings. IMPRESSION: Interval development of moderate small bowel dilatation is noted concerning for ileus or distal small bowel obstruction. Proximal sigmoid diverticulitis is  again noted. Mildly nodular hepatic margins are noted suggesting hepatic cirrhosis. Large calcified uterine fibroid is noted. Mild bibasilar subsegmental atelectasis or pneumonia is noted. Aortic Atherosclerosis (ICD10-I70.0). Electronically Signed   By: Marijo Conception, M.D.   On: 08/03/2017 17:16   Ct Abdomen Pelvis Wo Contrast  Result Date: 07/31/2017 CLINICAL DATA:  Abdominal pain, distention, nausea, and vomiting. EXAM: CT ABDOMEN AND PELVIS WITHOUT CONTRAST TECHNIQUE: Multidetector CT imaging of the abdomen and pelvis was performed following the standard protocol without IV contrast. COMPARISON:  Right upper quadrant abdominal ultrasound 02/23/2014. Renal ultrasound 10/01/2013. Abdominal ultrasound 04/06/2011. FINDINGS: Lower chest: Patchy consolidation in both lower lobes, incompletely imaged. No pleural effusion. Partially visualized ICD leads and cardiomegaly. Hepatobiliary: Slightly nodular liver contour without focal  abnormality identified on this unenhanced study. The layering hyperdense material in the gallbladder lumen without gross wall thickening or pericholecystic inflammation. No biliary dilatation. Pancreas: Unremarkable. Spleen: Unremarkable. Adrenals/Urinary Tract: Unremarkable adrenal glands. No evidence of renal mass, calculi, or hydronephrosis. Mildly distended urinary bladder without evidence of wall thickening. Stomach/Bowel: The stomach is within normal limits. Scattered diverticula are noted in the distal descending and sigmoid colon, and there is the suggestion of mild wall thickening of the proximal sigmoid colon with mild surrounding fat stranding. Scattered air-fluid levels are present in multiple small bowel loops without bowel dilatation suggestive of a significant mechanical obstruction. There is a question of mild wall thickening involving a few small bowel loops in the left mid to lower abdomen. Prior appendectomy. Vascular/Lymphatic: Abdominal aortic atherosclerosis without  aneurysm. No enlarged lymph nodes. Reproductive: Multiple calcified uterine fibroids including a 6 cm anterior fibroid resulting in impression on the bladder. No adnexal mass. Other: No intraperitoneal free fluid. Mild nonspecific haziness throughout the mesenteric fat. No abdominal wall hernia. Musculoskeletal: Lower lumbar disc bulging and moderate facet arthrosis. IMPRESSION: 1. Suspected mild acute diverticulitis involving the proximal sigmoid colon. 2. Scattered small bowel air-fluid levels and with questionable wall thickening of a few left-sided small bowel loops, query enteritis. 3. Bilateral lower lobe lung opacities compatible with pneumonia. 4. Slightly nodular contour of the liver which could reflect mild changes of cirrhosis. 5. Layering density in the gallbladder lumen may represent concentrated bile and/or small stones. 6. Fibroid uterus. Electronically Signed   By: Logan Bores M.D.   On: 07/31/2017 18:17   Dg Chest 2 View  Result Date: 08/16/2017 CLINICAL DATA:  Cough. EXAM: CHEST - 2 VIEW COMPARISON:  Chest x-ray dated August 13, 2017. FINDINGS: Unchanged left chest wall AICD. Stable cardiomegaly. Interstitial and alveolar edema may be slightly improved. Decrease in size of small right pleural effusion. No pneumothorax. No acute osseous abnormality. IMPRESSION: 1. Slightly improved pulmonary edema and small right pleural effusion. Electronically Signed   By: Titus Dubin M.D.   On: 08/16/2017 15:34   Dg Chest 2 View  Result Date: 08/08/2017 CLINICAL DATA:  72 year old female with shortness of breath and left upper extremity swelling. EXAM: CHEST - 2 VIEW COMPARISON:  07/31/2017 and earlier. FINDINGS: Bilateral basilar predominant increased interstitial and patchy indistinct perihilar opacity. Small bilateral pleural effusions are suspected. Lung volumes are lower than in November 2018. No pneumothorax. Stable cardiomegaly and mediastinal contours. Stable left chest cardiac AICD. No acute  osseous abnormality identified. Negative visible bowel gas pattern. IMPRESSION: Acute perihilar pulmonary edema suspected with small bilateral pleural effusions. Acute viral/atypical respiratory infection felt less likely. Electronically Signed   By: Genevie Ann M.D.   On: 08/08/2017 13:23   Dg Chest 2 View  Result Date: 07/31/2017 CLINICAL DATA:  Chest pain short of breath EXAM: CHEST - 2 VIEW COMPARISON:  03/22/2017 FINDINGS: Hypoventilation with bibasilar atelectasis/infiltrate. Negative for heart failure or effusion. AICD unchanged in position IMPRESSION: Hypoventilation with bibasilar atelectasis/infiltrate. Electronically Signed   By: Franchot Gallo M.D.   On: 07/31/2017 13:04   US Renal  Result Date: 08/03/2017 CLINICAL DATA:  Acute renal failure, history hypertension, hyperlipidemia, CHF, non ischemic cardiomyopathy EXAM: RENAL / URINARY TRACT ULTRASOUND COMPLETE COMPARISON:  CT abdomen and pelvis 07/31/2017 FINDINGS: Right Kidney: Length: 9.5 cm. Normal cortical thickness. Increased cortical echogenicity. No mass, hydronephrosis, or or shadowing calcification. Left Kidney: Length: 10.6 cm. Normal cortical thickness. Increased cortical echogenicity. No mass, hydronephrosis, or or shadowing calcification. Bladder: Appears  normal for degree of bladder distention. BILATERAL ureteral jets noted. IMPRESSION: Medical renal disease changes of the kidneys. Electronically Signed   By: Lavonia [name] M.D.   On: 08/03/2017 15:58   Dg Chest Port 1 View  Result Date: 08/13/2017 CLINICAL DATA:  Syncope and shortness of breath EXAM: PORTABLE CHEST 1 VIEW COMPARISON:  August 11, 2017 FINDINGS: There is diffuse interstitial and alveolar opacity felt to represent edema throughout the mid and lower lung zones. There is cardiomegaly with pulmonary venous hypertension. There is a small right effusion. Pacemaker leads are attached the right atrium, right ventricle, and coronary sinus. There is aortic atherosclerosis. No  adenopathy. No bone lesions. IMPRESSION: Widespread interstitial and alveolar edema with cardiomegaly, right effusion, and pulmonary venous hypertension. These are findings indicative of congestive heart failure. Pacemaker leads as described. There is aortic atherosclerosis. Aortic Atherosclerosis (ICD10-I70.0). Electronically Signed   By: Lowella Grip III M.D.   On: 08/13/2017 09:36   Dg Abd 2 Views  Result Date: 08/06/2017 CLINICAL DATA:  Small-bowel obstruction. History of an appendectomy. EXAM: ABDOMEN - 2 VIEW COMPARISON:  CT, 08/03/2017 FINDINGS: There are dilated loops of small bowel with no colonic distention, similar to the prior CT. Air-fluid levels are noted on the decubitus view. No free air. Calcified fibroid is noted in the central pelvis. Surgical clips the right lower quadrant reflect prior appendectomy. IMPRESSION: 1. Persistent partial small bowel obstruction. No significant change from the prior CT scan. No free air. Electronically Signed   By: Lajean Manes M.D.   On: 08/06/2017 09:22   Dg Abd Acute W/chest  Result Date: 08/11/2017 CLINICAL DATA:  72 year old female with cough, syncope, dyspnea and abdominal distension EXAM: DG ABDOMEN ACUTE W/ 1V CHEST COMPARISON:  Prior acute abdominal series 08/09/2017 FINDINGS: Left subclavian approach biventricular cardiac rhythm maintenance device remains in unchanged position with leads projecting over the right atrium, right ventricle and overlying the left ventricle. Stable cardiomegaly. Atherosclerotic calcifications again noted in the transverse aorta. Slight interval increase in pulmonary vascular congestion now with mild edema. Interval development of small bilateral pleural effusions. No evidence of free air on the semi-erect or lateral decubitus views. Nonspecific bowel gas pattern with gas in multiple loops of small bowel. There does appear to be diffuse mild small bowel wall thickening with thickening of the valvulae. Dystrophic  calcified uterine fibroid. No acute osseous abnormality. IMPRESSION: 1. Developing CHF with increased pulmonary vascular congestion, mild pulmonary edema and new small bilateral pleural effusions. 2. Mild gaseous distention of several loops of small bowel in the mid abdomen with evidence of submucosal thickening/thickening of the valvulae. Differential considerations are broad, but most commonly this represents either infectious or inflammatory enteritis. 3. Cardiomegaly. 4.  Aortic Atherosclerosis (ICD10-170.0) Electronically Signed   By: Jacqulynn Cadet M.D.   On: 08/11/2017 13:15   Dg Abd Acute W/chest  Result Date: 08/09/2017 CLINICAL DATA:  Severe abdominal pain.  Abdominal distention. EXAM: DG ABDOMEN ACUTE W/ 1V CHEST COMPARISON:  Chest x-ray 08/08/2017. Abdomen series 08/06/2017. CT 08/03/2017. FINDINGS: AICD in stable position. Stable cardiomegaly with diffuse bilateral pulmonary infiltrates most consistent pulmonary edema. Bilateral pneumonia cannot be excluded. Small bilateral pleural effusions. No interim change. Persistent prominent distended loops of small bowel are noted. Relative paucity of intra colonic gas is present. Findings consistent with small-bowel obstruction. Small-bowel diameter up to 4.6 cm. No free air. Pelvic calcifications again noted consistent calcified fibroids as previously noted on prior CT. Degenerative changes lumbar spine and both hips. Surgical clips  right quadrant. IMPRESSION: 1. AICD in stable position. Persistent changes of congestive heart failure bilateral pulmonary edema small pleural effusions. 2. Persistent changes of small-bowel obstruction. Small-bowel dilatation now measures up to 4.6 cm. Electronically Signed   By: Marcello Moores  Clay   On: 08/09/2017 11:03   [name] Lt Upper Extrem Ltd Soft Tissue Non Vascular  Result Date: 08/08/2017 CLINICAL DATA:  Left antecubital fossa swelling after blood draw. EXAM: ULTRASOUND LEFT UPPER EXTREMITY LIMITED TECHNIQUE:  Ultrasound examination of the upper extremity soft tissues was performed in the area of clinical concern. COMPARISON:  None. FINDINGS: There is subcutaneous edema in the area of clinical concern along the left antecubital fossa and proximal volar forearm. No discrete fluid collection. IMPRESSION: Subcutaneous edema in the left antecubital fossa and proximal volar forearm. No fluid collection. Electronically Signed   By: Titus Dubin M.D.   On: 08/08/2017 18:54     Subjective: Reports having dry cough,but sob improved.   Discharge Exam: Vitals:   08/17/17 1155 08/17/17 1330  BP:  (!) 161/57  Pulse: 73 72  Resp:  20  Temp:  98.8 F (37.1 C)  SpO2: 100% 97%   Vitals:   08/17/17 0445 08/17/17 1021 08/17/17 1155 08/17/17 1330  BP: 129/61   (!) 161/57  Pulse: 69  73 72  Resp: 20   20  Temp: 98.6 F (37 C)   98.8 F (37.1 C)  TempSrc:    Oral  SpO2: 100%  100% 97%  Weight:  70.9 kg (156 lb 4.9 oz)    Height:        General: Pt is alert, awake, not in acute distress Cardiovascular: RRR, S1/S2 +, no rubs, no gallops Respiratory: CTA bilaterally, no wheezing, no rhonchi Abdominal: Soft, NT, ND, bowel sounds + Extremities: improving edema, no cyanosis    The results of significant diagnostics from this hospitalization (including imaging, microbiology, ancillary and laboratory) are listed below for reference.     Microbiology: Recent Results (from the past 240 hour(s))  MRSA PCR Screening     Status: None   Collection Time: 08/09/17  8:00 PM  Result Value Ref Range Status   MRSA by PCR NEGATIVE NEGATIVE Final    Comment:        The GeneXpert MRSA Assay (FDA approved for NASAL specimens only), is one component of a comprehensive MRSA colonization surveillance program. It is not intended to diagnose MRSA infection nor to guide or monitor treatment for MRSA infections. Performed at [facility], [place] [address], [place], [place] [address]       Labs: BNP (last 3 results) Recent Labs    02/22/17 2126 07/31/17 1229  BNP 1,275.8* 627.0*   Basic Metabolic Panel: Recent Labs  Lab 08/13/17 0435 08/14/17 0435 08/15/17 0437 08/16/17 0429 08/17/17 0421  NA 139 137 136 137 136  K 4.3 3.5 3.2* 3.3* 4.1  CL 102 96* 92* 89* 89*  CO2 23 27 30  32 33*  GLUCOSE 113* 109* 111* 101* 101*  BUN 38* 38* 38* 34* 34*  CREATININE 1.98* 1.80* 1.69* 1.69* 1.62*  CALCIUM 9.1 9.1 9.1 9.2 9.1  PHOS  --  2.7 2.8 3.1 2.9   Liver Function Tests: Recent Labs  Lab 08/14/17 0435 08/15/17 0437 08/16/17 0429 08/17/17 0421  ALBUMIN 3.2* 3.0* 3.0* 3.1*   No results for input(s): LIPASE, AMYLASE in the last 168 hours. No results for input(s): AMMONIA in the last 168 hours. CBC: Recent Labs  Lab 08/11/17 0319 08/13/17 0435  WBC 8.7 9.9  HGB 10.3* 9.7*  HCT 31.5* 30.1*  MCV 87.7 89.1  PLT 194 184   Cardiac Enzymes: No results for input(s): CKTOTAL, CKMB, CKMBINDEX, TROPONINI in the last 168 hours. BNP: Invalid input(s): POCBNP CBG: Recent Labs  Lab 08/16/17 1143 08/16/17 1553 08/16/17 2259 08/17/17 0741 08/17/17 1113  GLUCAP 140* 113* 129* 123* 186*   D-Dimer No results for input(s): DDIMER in the last 72 hours. Hgb A1c No results for input(s): HGBA1C in the last 72 hours. Lipid Profile No results for input(s): CHOL, HDL, LDLCALC, TRIG, CHOLHDL, LDLDIRECT in the last 72 hours. Thyroid function studies No results for input(s): TSH, T4TOTAL, T3FREE, THYROIDAB in the last 72 hours.  Invalid input(s): FREET3 Anemia work up No results for input(s): VITAMINB12, FOLATE, FERRITIN, TIBC, IRON, RETICCTPCT in the last 72 hours. Urinalysis    Component Value Date/Time   COLORURINE YELLOW 08/03/2017 1003   APPEARANCEUR HAZY (A) 08/03/2017 1003   LABSPEC 1.019 08/03/2017 1003   PHURINE 5.0 08/03/2017 1003   GLUCOSEU NEGATIVE 08/03/2017 1003   HGBUR NEGATIVE 08/03/2017 1003   HGBUR negative 02/28/2008 0000   BILIRUBINUR  NEGATIVE 08/03/2017 1003   KETONESUR 5 (A) 08/03/2017 1003   PROTEINUR NEGATIVE 08/03/2017 1003   UROBILINOGEN 1.0 07/19/2010 1752   NITRITE NEGATIVE 08/03/2017 1003   LEUKOCYTESUR NEGATIVE 08/03/2017 1003   Sepsis Labs Invalid input(s): PROCALCITONIN,  WBC,  LACTICIDVEN Microbiology Recent Results (from the past 240 hour(s))  MRSA PCR Screening     Status: None   Collection Time: 08/09/17  8:00 PM  Result Value Ref Range Status   MRSA by PCR NEGATIVE NEGATIVE Final    Comment:        The GeneXpert MRSA Assay (FDA approved for NASAL specimens only), is one component of a comprehensive MRSA colonization surveillance program. It is not intended to diagnose MRSA infection nor to guide or monitor treatment for MRSA infections. Performed at [facility], [place] [address] [place], [place] [address]      Time coordinating discharge: 45 minutes.   SIGNED:   Hosie Poisson, MD  [facility] 08/17/2017, 1:57 PM Pager   If 7PM-7AM, please contact night-coverage [email or web address] Password [email or web address]

## 2017-08-17 NOTE — Progress Notes (Signed)
Patient discharged to [facility], transported by [facility]. ;patient is stable with no complain. Called [facility] to give report but nurse was unavailable. Left my number for call back when she is less busy.

## 2017-08-17 NOTE — Clinical Social Work Placement (Signed)
Patient received and accepted bed offer at [facility]. Facility aware of patient's discharge and confirmed bed offer. PTAR contacted, patient's family notified. Patient's RN can call report to [phone], packet complete. CSW signing off, no other needs identified at this time.  CLINICAL SOCIAL WORK PLACEMENT  NOTE  Date:  08/17/2017  Patient Details  Name: Morgan Clay MRN: [id number] Date of Birth: [birth year]  Clinical Social Work is seeking post-discharge placement for this patient at the [place] level of care (*CSW will initial, date and re-position this form in  chart as items are completed):  Yes   Patient/family provided with [place] Work Department's list of facilities offering this level of care within the geographic area requested by the patient (or if unable, by the patient's family).  Yes   Patient/family informed of their freedom to choose among providers that offer the needed level of care, that participate in Medicare, Medicaid or managed care program needed by the patient, have an available bed and are willing to accept the patient.  Yes   Patient/family informed of 's ownership interest in [facility] and [facility], as well as of the fact that they are under no obligation to receive care at these facilities.  PASRR submitted to EDS on       PASRR number received on 08/17/17     Existing PASRR number confirmed on       FL2 transmitted to all facilities in geographic area requested by pt/family on 08/07/17     FL2 transmitted to all facilities within larger geographic area on       Patient informed that his/her managed care company has contracts with or will negotiate with certain facilities, including the following:        Yes   Patient/family informed of bed offers received.  Patient chooses bed at [facility]     Physician recommends and patient chooses bed at      Patient to be transferred to  [facility] on 08/17/17.  Patient to be transferred to facility by PTAR     Patient family notified on 08/17/17 of transfer.  Name of family member notified:  Delbert Harness     PHYSICIAN       Additional Comment:    _______________________________________________ Burnis Medin, LCSW 08/17/2017, 1:26 PM

## 2017-08-20 ENCOUNTER — Telehealth: Payer: Self-pay | Admitting: Cardiology

## 2017-08-20 NOTE — Telephone Encounter (Signed)
Attempted to confirm remote transmission with pt. No answer and was unable to leave a message.   

## 2017-08-24 NOTE — Progress Notes (Signed)
No ICM remote transmission received for 08/20/2017 and next ICM transmission scheduled for 09/10/2017.

## 2017-08-27 ENCOUNTER — Telehealth: Payer: Self-pay | Admitting: Family Medicine

## 2017-08-27 NOTE — Telephone Encounter (Signed)
I called the pt and scheduled an appt for 4/25 as the pt could not come in on 4/23.

## 2017-08-27 NOTE — Telephone Encounter (Signed)
Copied from [place] [phone]. Topic: Quick Communication - See Telephone Encounter >> Aug 27, 2017 10:21 AM Percell Belt A wrote: CRM for notification. See Telephone encounter for: 08/27/17.  Pt called in and said that she was just discharge on Friday the 19th.  She as in there for a month.  She is needing a Hosp fu this week but I need not see anything available.  She would like to know if she could be worked in this week.   This stated that she cant sleep and she is still having pain.    Best number [phone]

## 2017-08-28 DIAGNOSIS — I110 Hypertensive heart disease with heart failure: Secondary | ICD-10-CM | POA: Diagnosis not present

## 2017-08-28 DIAGNOSIS — I5022 Chronic systolic (congestive) heart failure: Secondary | ICD-10-CM | POA: Diagnosis not present

## 2017-08-28 DIAGNOSIS — H409 Unspecified glaucoma: Secondary | ICD-10-CM | POA: Diagnosis not present

## 2017-08-28 DIAGNOSIS — I429 Cardiomyopathy, unspecified: Secondary | ICD-10-CM | POA: Diagnosis not present

## 2017-08-28 DIAGNOSIS — I4891 Unspecified atrial fibrillation: Secondary | ICD-10-CM | POA: Diagnosis not present

## 2017-08-28 DIAGNOSIS — M1990 Unspecified osteoarthritis, unspecified site: Secondary | ICD-10-CM | POA: Diagnosis not present

## 2017-08-28 DIAGNOSIS — K5790 Diverticulosis of intestine, part unspecified, without perforation or abscess without bleeding: Secondary | ICD-10-CM | POA: Diagnosis not present

## 2017-08-28 DIAGNOSIS — E119 Type 2 diabetes mellitus without complications: Secondary | ICD-10-CM | POA: Diagnosis not present

## 2017-08-28 DIAGNOSIS — I4892 Unspecified atrial flutter: Secondary | ICD-10-CM | POA: Diagnosis not present

## 2017-08-28 DIAGNOSIS — J45909 Unspecified asthma, uncomplicated: Secondary | ICD-10-CM | POA: Diagnosis not present

## 2017-08-28 DIAGNOSIS — E785 Hyperlipidemia, unspecified: Secondary | ICD-10-CM | POA: Diagnosis not present

## 2017-08-28 DIAGNOSIS — R2681 Unsteadiness on feet: Secondary | ICD-10-CM | POA: Diagnosis not present

## 2017-08-28 DIAGNOSIS — K529 Noninfective gastroenteritis and colitis, unspecified: Secondary | ICD-10-CM | POA: Diagnosis not present

## 2017-08-28 DIAGNOSIS — Z7901 Long term (current) use of anticoagulants: Secondary | ICD-10-CM | POA: Diagnosis not present

## 2017-08-28 DIAGNOSIS — Z7951 Long term (current) use of inhaled steroids: Secondary | ICD-10-CM | POA: Diagnosis not present

## 2017-08-28 DIAGNOSIS — E039 Hypothyroidism, unspecified: Secondary | ICD-10-CM | POA: Diagnosis not present

## 2017-08-28 DIAGNOSIS — G4733 Obstructive sleep apnea (adult) (pediatric): Secondary | ICD-10-CM | POA: Diagnosis not present

## 2017-08-28 DIAGNOSIS — I447 Left bundle-branch block, unspecified: Secondary | ICD-10-CM | POA: Diagnosis not present

## 2017-08-29 ENCOUNTER — Telehealth: Payer: Self-pay | Admitting: Family Medicine

## 2017-08-29 NOTE — Telephone Encounter (Signed)
I spoke with Hinton Dyer from [facility], possible drug interactions:   1. Amiodarone &Trazodone 2. Elilquis & Zoloft 3. Zoloft &Trazodone  Patient is on the schedule to see Dr. [name] on Thursday 08/30/2017.

## 2017-08-29 NOTE — Telephone Encounter (Signed)
Copied from [place] [phone]. Topic: Quick Communication - See Telephone Encounter >> Aug 29, 2017 12:20 PM Boyd Kerbs wrote: CRM for notification.   [facility] said when start care yesterday and there was interaction with medication. Software screens and flagged several medications.  Please call and let her know how to proceed.   See Telephone encounter for: 08/29/17.

## 2017-08-30 ENCOUNTER — Encounter: Payer: Self-pay | Admitting: Family Medicine

## 2017-08-30 ENCOUNTER — Telehealth: Payer: Self-pay | Admitting: Internal Medicine

## 2017-08-30 ENCOUNTER — Ambulatory Visit (INDEPENDENT_AMBULATORY_CARE_PROVIDER_SITE_OTHER): Payer: Medicare Other | Admitting: Family Medicine

## 2017-08-30 ENCOUNTER — Telehealth: Payer: Self-pay | Admitting: *Deleted

## 2017-08-30 VITALS — BP 136/60 | HR 60 | Temp 97.7°F | Ht 59.0 in

## 2017-08-30 DIAGNOSIS — R0600 Dyspnea, unspecified: Secondary | ICD-10-CM

## 2017-08-30 DIAGNOSIS — K5790 Diverticulosis of intestine, part unspecified, without perforation or abscess without bleeding: Secondary | ICD-10-CM | POA: Diagnosis not present

## 2017-08-30 DIAGNOSIS — I10 Essential (primary) hypertension: Secondary | ICD-10-CM

## 2017-08-30 DIAGNOSIS — Z7951 Long term (current) use of inhaled steroids: Secondary | ICD-10-CM | POA: Diagnosis not present

## 2017-08-30 DIAGNOSIS — N183 Chronic kidney disease, stage 3 unspecified: Secondary | ICD-10-CM

## 2017-08-30 DIAGNOSIS — I428 Other cardiomyopathies: Secondary | ICD-10-CM

## 2017-08-30 DIAGNOSIS — R2681 Unsteadiness on feet: Secondary | ICD-10-CM | POA: Diagnosis not present

## 2017-08-30 DIAGNOSIS — I152 Hypertension secondary to endocrine disorders: Secondary | ICD-10-CM

## 2017-08-30 DIAGNOSIS — I700 Atherosclerosis of aorta: Secondary | ICD-10-CM

## 2017-08-30 DIAGNOSIS — I110 Hypertensive heart disease with heart failure: Secondary | ICD-10-CM | POA: Diagnosis not present

## 2017-08-30 DIAGNOSIS — M1990 Unspecified osteoarthritis, unspecified site: Secondary | ICD-10-CM | POA: Diagnosis not present

## 2017-08-30 DIAGNOSIS — I5022 Chronic systolic (congestive) heart failure: Secondary | ICD-10-CM | POA: Diagnosis not present

## 2017-08-30 DIAGNOSIS — E119 Type 2 diabetes mellitus without complications: Secondary | ICD-10-CM | POA: Diagnosis not present

## 2017-08-30 DIAGNOSIS — G4733 Obstructive sleep apnea (adult) (pediatric): Secondary | ICD-10-CM | POA: Diagnosis not present

## 2017-08-30 DIAGNOSIS — I4891 Unspecified atrial fibrillation: Secondary | ICD-10-CM | POA: Diagnosis not present

## 2017-08-30 DIAGNOSIS — D649 Anemia, unspecified: Secondary | ICD-10-CM

## 2017-08-30 DIAGNOSIS — I4892 Unspecified atrial flutter: Secondary | ICD-10-CM | POA: Diagnosis not present

## 2017-08-30 DIAGNOSIS — E1159 Type 2 diabetes mellitus with other circulatory complications: Secondary | ICD-10-CM

## 2017-08-30 DIAGNOSIS — F3341 Major depressive disorder, recurrent, in partial remission: Secondary | ICD-10-CM

## 2017-08-30 DIAGNOSIS — Z7901 Long term (current) use of anticoagulants: Secondary | ICD-10-CM | POA: Diagnosis not present

## 2017-08-30 DIAGNOSIS — E039 Hypothyroidism, unspecified: Secondary | ICD-10-CM | POA: Diagnosis not present

## 2017-08-30 DIAGNOSIS — K56600 Partial intestinal obstruction, unspecified as to cause: Secondary | ICD-10-CM

## 2017-08-30 DIAGNOSIS — E1122 Type 2 diabetes mellitus with diabetic chronic kidney disease: Secondary | ICD-10-CM

## 2017-08-30 DIAGNOSIS — E785 Hyperlipidemia, unspecified: Secondary | ICD-10-CM | POA: Diagnosis not present

## 2017-08-30 DIAGNOSIS — J45909 Unspecified asthma, uncomplicated: Secondary | ICD-10-CM | POA: Diagnosis not present

## 2017-08-30 DIAGNOSIS — H409 Unspecified glaucoma: Secondary | ICD-10-CM | POA: Diagnosis not present

## 2017-08-30 DIAGNOSIS — I429 Cardiomyopathy, unspecified: Secondary | ICD-10-CM | POA: Diagnosis not present

## 2017-08-30 DIAGNOSIS — K529 Noninfective gastroenteritis and colitis, unspecified: Secondary | ICD-10-CM | POA: Diagnosis not present

## 2017-08-30 DIAGNOSIS — I447 Left bundle-branch block, unspecified: Secondary | ICD-10-CM | POA: Diagnosis not present

## 2017-08-30 HISTORY — DX: Atherosclerosis of aorta: I70.0

## 2017-08-30 NOTE — Telephone Encounter (Signed)
I left a detailed message with the information below at [name]'s cell number.

## 2017-08-30 NOTE — Telephone Encounter (Signed)
Per Dr [name] I called [facility] to schedule a hospital follow up and spoke with [facility].  She stated a message will be sent to the nurse to call back with appt info. I also called [facility] and spoke with [facility] and she scheduled the pt for an appt on 4/29 at 3:45pm and I faxed the office notes from today, hospital discharge summary and last labs by Dr [name] to her at [phone].  Patient was given this information with her AVS.

## 2017-08-30 NOTE — Telephone Encounter (Signed)
New Message   Mechele Claude at [facility],Dr. Kim's office is trying to get an asap appointment for a pt due to SOB and weakness, informed the soonest appt we have for a pa or physician would be beginning of Jun but needs something sooner. Please call

## 2017-08-30 NOTE — Progress Notes (Signed)
HPI:  Using dictation device. Unfortunately this device frequently misinterprets words/phrases.  Morgan Clay is a pleasant 72 y.o. with unfortunately a very complicated PMH significant for NICM s/p CRT-D, A. Fib, OSA, hypothyroidism, depression and diabetes here for a hospital follow up. See transitional care phone note in Epic. She did not keep her hospital follow up as advised. Per review of discharge documents and patient: Hospitalized 3/26  to 08/17/17 Primary admitting complaint(s): abd pain, emesis.  Primary admitting diagnosis (es) and treatment: diverticulitis, mild, pulm edema, ? SBO - GI issues resolved with tx Other significant diagnosis (es) and treatment:  -AoCdCHF - cardiology consulted, diuresed, discharged on lasix, bidil, spironolactone, she was to follow up with cardiology outpt - she has not, did not have appt -AoCKD, nephrology consulted, improved with diuresis to baseline, pt to follow up with nephrology outpt  - no appt -arm swelling, no DVT per discharge notes Follow up concerns per discharge document: Reports today: abd pain resolved, eating well, bowels ok; lasix dose decreased to 40mg  after hospitalization and she sometimes has swelling since hospitalization, feels "wiped out", feels sob with activity since discharge Denies:cp, fever, cough, diarrhea, vomiting, palpitations   ROS: See pertinent positives and negatives per HPI.  Past Medical History:  Diagnosis Date  . Arthritis   . Asthma    reports mild asthma since childhood - had COPD on dx list from prior PCP  . Atypical atrial flutter ([place]) 09/28/2015  . Chronic systolic congestive heart failure ([place])   . DEPRESSION 12/17/2009   Annotation: PHQ-9 score = 14 done on 12/17/2009 Qualifier: Diagnosis of  By: Hassell Done FNP, Tori Milks    . FIBROIDS, UTERUS 03/05/2008  . Glaucoma   . HEPATITIS C - s/p treatment with Harvoni, seeing hepatology, Dawn Drazek 01/04/2007  . Hyperlipemia 12/06/2012  . HYPERTENSION,  BENIGN 04/24/2007  . Hypothyroidism   . LBBB (left bundle branch block)   . Nonischemic cardiomyopathy ([place])   . OBESITY 05/27/2009  . OBSTRUCTIVE SLEEP APNEA 11/14/2007   npsg 2009:  Mild osa with AHI 11/hr. Started cpap 2009, quit using due to lack of response Home sleep testing 09/2010 with AHI only 7/hr and weight loss advised by pulmonology.  . OSTEOPENIA 09/30/2008  . Personal history of other infectious and parasitic disease    Hepatitis B    Past Surgical History:  Procedure Laterality Date  . appendectomy    . CARDIAC CATHETERIZATION    . CARDIOVERSION N/A 12/14/2014   Procedure: CARDIOVERSION;  Surgeon: Dorothy Spark, MD;  Location: [facility] ENDOSCOPY;  Service: Cardiovascular;  Laterality: N/A;  . CARDIOVERSION N/A 02/26/2017   Procedure: CARDIOVERSION;  Surgeon: Evans Lance, MD;  Location: [place] CV LAB;  Service: Cardiovascular;  Laterality: N/A;  . CARDIOVERSION N/A 07/24/2017   Procedure: CARDIOVERSION;  Surgeon: Thayer Headings, MD;  Location: [facility] ENDOSCOPY;  Service: Cardiovascular;  Laterality: N/A;  . EP IMPLANTABLE DEVICE N/A 10/06/2015   Procedure: BIV ICD Generator Changeout;  Surgeon: [name], MD;  Location: [place] CV LAB;  Service: Cardiovascular;  Laterality: N/A;    Family History  Problem Relation Age of Onset  . Asthma Father   . Heart attack Father   . Asthma Sister   . Cancer Sister        lung  . Heart attack Mother   . Stroke Brother   . Diabetes Unknown     SOCIAL HX: see hpi   Current Outpatient Medications:  .  albuterol (PROVENTIL HFA;VENTOLIN HFA) 108 (  90 BASE) MCG/ACT inhaler, Inhale 2 puffs into the lungs every 6 (six) hours as needed for wheezing or shortness of breath., Disp: 1 Inhaler, Rfl: 3 .  amiodarone (PACERONE) 200 MG tablet, 2/20-2/26 take 2 tablets, 400mg  twice/day 2/27-3/19- take 1 tablet, 200mg  twice/day March 20- one tablet, 200mg  daily (Patient taking differently: Take 200 mg by mouth 2 (two) times daily.  2/20-2/26 take 2 tablets, 400mg  twice/day 2/27-3/19- take 1 tablet, 200mg  twice/day March 20- one tablet, 200mg  daily), Disp: 90 tablet, Rfl: 3 .  BIDIL 20-37.5 MG tablet, TAKE 1 TABLET BY MOUTH TWICE DAILY (Patient taking differently: TAKE 1 TABLET (37.5 mg) BY MOUTH TWICE DAILY), Disp: 180 tablet, Rfl: 3 .  carvedilol (COREG) 12.5 MG tablet, Take 1 tablet (12.5 mg total) by mouth 2 (two) times daily with a meal., Disp: 60 tablet, Rfl: 0 .  ELIQUIS 5 MG TABS tablet, TAKE ONE TABLET BY MOUTH TWICE DAILY, Disp: 180 tablet, Rfl: 3 .  furosemide (LASIX) 40 MG tablet, Take 1 tablet (40 mg total) by mouth 2 (two) times daily., Disp: 60 tablet, Rfl: 0 .  Hydrocortisone (GERHARDT'S BUTT CREAM) CREA, Apply 1 application topically 2 (two) times daily., Disp: 1 each, Rfl: 2 .  levothyroxine (SYNTHROID, LEVOTHROID) 125 MCG tablet, Take 1 tablet (125 mcg total) by mouth daily. (Patient taking differently: Take 125 mcg by mouth daily before breakfast. ), Disp: 90 tablet, Rfl: 0 .  nitroGLYCERIN (NITROSTAT) 0.4 MG SL tablet, Place 0.4 mg under the tongue every 5 (five) minutes as needed for chest pain., Disp: , Rfl:  .  pantoprazole (PROTONIX) 40 MG tablet, Take 1 tablet (40 mg total) by mouth daily., Disp: 30 tablet, Rfl: 0 .  sertraline (ZOLOFT) 50 MG tablet, Take 1 tablet (50 mg total) by mouth daily., Disp: 30 tablet, Rfl: 3 .  spironolactone (ALDACTONE) 25 MG tablet, Take 1 tablet (25 mg total) daily by mouth., Disp: 90 tablet, Rfl: 3 .  Tetrahydrozoline HCl (VISINE OP), Place 1 drop into both eyes daily as needed (DRY EYES)., Disp: , Rfl:  .  traMADol (ULTRAM) 50 MG tablet, Take 2 tablets (100 mg total) by mouth every 12 (twelve) hours as needed (mild pain. use Tylenol first)., Disp: 8 tablet, Rfl: 0 .  traZODone (DESYREL) 50 MG tablet, Take 1 tablet (50 mg total) by mouth at bedtime. (Patient taking differently: Take 50 mg by mouth at bedtime as needed for sleep. ), Disp: 90 tablet, Rfl: 3  EXAM:  Vitals:    08/30/17 1515  BP: 136/60  Pulse: 60  Temp: 97.7 F (36.5 C)  SpO2: 98%    Body mass index is 31.57 kg/m.  GENERAL: vitals reviewed and listed above, alert, oriented, appears well hydrated and in no acute distress  HEENT: atraumatic, conjunttiva clear, no obvious abnormalities on inspection of external nose and ears  NECK: no obvious masses on inspection  LUNGS: clear to auscultation bilaterally, no wheezes, rales or rhonchi, good air movement except decreased breath sound R base  CV: HRRR, no peripheral edema  MS: moves all extremities without noticeable abnormality  PSYCH: pleasant and cooperative, no obvious depression or anxiety  ASSESSMENT AND PLAN:  Discussed the following assessment and plan:  Dyspnea, unspecified type - Plan: DG Chest 2 View  Aortic atherosclerosis (HCC)  Nonischemic cardiomyopathy (HCC)  Chronic systolic congestive heart failure (HCC)  Hypertension associated with diabetes ([place]) - Plan: Basic metabolic panel  Controlled type 2 diabetes mellitus with chronic kidney disease, without long-term current use of insulin,  unspecified CKD stage (HCC)  MDD (major depressive disorder), recurrent, in partial remission (HCC)  Stage 3 chronic kidney disease (HCC)  Partial small bowel obstruction ([place])  -it seems like her bowel issues have resolved thankfully -she reports dyspnea unchanged since discharge and she did not follow up with her cardiologist as planned, will check CXR and labs -sig drop in hemoglobin in hospital - not mentioned on discharge doc - will recheck -also will have my assistant help her schedule cardiology follow up and nephrology follow up -regular follow up 3 months. Sooner as needed.  There are no Patient Instructions on file for this visit.  [name], DO

## 2017-08-30 NOTE — Patient Instructions (Addendum)
BEFORE YOU LEAVE: -xray sheet -lab -follow up: 3 months  Follow up with your cardiologist and nephrologist.  Get the chest xray  We have ordered labs or studies at this visit. It can take up to 1-2 weeks for results and processing. IF results require follow up or explanation, we will call you with instructions. Clinically stable results will be released to your [facility]. If you have not heard from [name] or cannot find your results in [facility] in 2 weeks please contact our office at [phone].  If you are not yet signed up for [facility], please consider signing up.

## 2017-08-30 NOTE — Telephone Encounter (Signed)
Called patient who is complaining of increased shortness of breath and dyspnea on exertion. She is audibly SOB as well. I advised her to increase her Lasix dose to 80mg  in AM and 40mg  in PM x 3days. Also advised her to limit salt intake and be mindful of fluid intake, drinking 3/4th of what she normally intakes. She will call on Monday if she has not seen any improvement.

## 2017-08-30 NOTE — Telephone Encounter (Signed)
Patient has been on a number of these medicines for very long time.  Several of these are managed by her specialist.  We will be seeing her soon.

## 2017-08-31 ENCOUNTER — Other Ambulatory Visit: Payer: Self-pay | Admitting: *Deleted

## 2017-08-31 ENCOUNTER — Ambulatory Visit (INDEPENDENT_AMBULATORY_CARE_PROVIDER_SITE_OTHER)
Admission: RE | Admit: 2017-08-31 | Discharge: 2017-08-31 | Disposition: A | Discharge status: Home / Self Care | Payer: Medicare Other | Source: Ambulatory Visit | Attending: Family Medicine | Admitting: Family Medicine

## 2017-08-31 DIAGNOSIS — E785 Hyperlipidemia, unspecified: Secondary | ICD-10-CM | POA: Diagnosis not present

## 2017-08-31 DIAGNOSIS — R0600 Dyspnea, unspecified: Secondary | ICD-10-CM

## 2017-08-31 DIAGNOSIS — I110 Hypertensive heart disease with heart failure: Secondary | ICD-10-CM | POA: Diagnosis not present

## 2017-08-31 DIAGNOSIS — G4733 Obstructive sleep apnea (adult) (pediatric): Secondary | ICD-10-CM | POA: Diagnosis not present

## 2017-08-31 DIAGNOSIS — R0602 Shortness of breath: Secondary | ICD-10-CM | POA: Diagnosis not present

## 2017-08-31 DIAGNOSIS — M1990 Unspecified osteoarthritis, unspecified site: Secondary | ICD-10-CM | POA: Diagnosis not present

## 2017-08-31 DIAGNOSIS — E119 Type 2 diabetes mellitus without complications: Secondary | ICD-10-CM | POA: Diagnosis not present

## 2017-08-31 DIAGNOSIS — J45909 Unspecified asthma, uncomplicated: Secondary | ICD-10-CM | POA: Diagnosis not present

## 2017-08-31 DIAGNOSIS — Z7951 Long term (current) use of inhaled steroids: Secondary | ICD-10-CM | POA: Diagnosis not present

## 2017-08-31 DIAGNOSIS — Z7901 Long term (current) use of anticoagulants: Secondary | ICD-10-CM | POA: Diagnosis not present

## 2017-08-31 DIAGNOSIS — E039 Hypothyroidism, unspecified: Secondary | ICD-10-CM | POA: Diagnosis not present

## 2017-08-31 DIAGNOSIS — R2681 Unsteadiness on feet: Secondary | ICD-10-CM | POA: Diagnosis not present

## 2017-08-31 DIAGNOSIS — K529 Noninfective gastroenteritis and colitis, unspecified: Secondary | ICD-10-CM | POA: Diagnosis not present

## 2017-08-31 DIAGNOSIS — H409 Unspecified glaucoma: Secondary | ICD-10-CM | POA: Diagnosis not present

## 2017-08-31 DIAGNOSIS — I5022 Chronic systolic (congestive) heart failure: Secondary | ICD-10-CM | POA: Diagnosis not present

## 2017-08-31 DIAGNOSIS — I429 Cardiomyopathy, unspecified: Secondary | ICD-10-CM | POA: Diagnosis not present

## 2017-08-31 DIAGNOSIS — I447 Left bundle-branch block, unspecified: Secondary | ICD-10-CM | POA: Diagnosis not present

## 2017-08-31 DIAGNOSIS — I4892 Unspecified atrial flutter: Secondary | ICD-10-CM | POA: Diagnosis not present

## 2017-08-31 DIAGNOSIS — K5790 Diverticulosis of intestine, part unspecified, without perforation or abscess without bleeding: Secondary | ICD-10-CM | POA: Diagnosis not present

## 2017-08-31 DIAGNOSIS — I4891 Unspecified atrial fibrillation: Secondary | ICD-10-CM | POA: Diagnosis not present

## 2017-08-31 LAB — BASIC METABOLIC PANEL
BUN: 28 mg/dL — AB (ref 6–23)
CO2: 24 mEq/L (ref 19–32)
CREATININE: 1.96 mg/dL — AB (ref 0.40–1.20)
Calcium: 9.7 mg/dL (ref 8.4–10.5)
Chloride: 103 mEq/L (ref 96–112)
GFR: 32.46 mL/min — AB (ref >60.00)
Glucose, Bld: 104 mg/dL — ABNORMAL HIGH (ref 70–99)
Potassium: 4.2 mEq/L (ref 3.5–5.1)
Sodium: 141 mEq/L (ref 135–145)

## 2017-08-31 LAB — CBC
HCT: 29.7 % — ABNORMAL LOW (ref 36.0–46.0)
Hemoglobin: 9.7 g/dL — ABNORMAL LOW (ref 12.0–15.0)
MCHC: 32.8 g/dL (ref 30.0–36.0)
MCV: 86.6 fl (ref 78.0–100.0)
Platelets: 275 10*3/uL (ref 150.0–400.0)
RBC: 3.43 Mil/uL — ABNORMAL LOW (ref 3.87–5.11)
RDW: 16.6 % — AB (ref 11.5–15.5)
WBC: 6.9 10*3/uL (ref 4.0–10.5)

## 2017-08-31 MED ORDER — AMOXICILLIN-POT CLAVULANATE 500-125 MG PO TABS: 1.0000 | ORAL_TABLET | Freq: Two times a day (BID) | ORAL | 0 refills | Status: DC

## 2017-09-03 DIAGNOSIS — R809 Proteinuria, unspecified: Secondary | ICD-10-CM | POA: Diagnosis not present

## 2017-09-03 DIAGNOSIS — I429 Cardiomyopathy, unspecified: Secondary | ICD-10-CM | POA: Diagnosis not present

## 2017-09-03 DIAGNOSIS — E119 Type 2 diabetes mellitus without complications: Secondary | ICD-10-CM | POA: Diagnosis not present

## 2017-09-03 DIAGNOSIS — N183 Chronic kidney disease, stage 3 (moderate): Secondary | ICD-10-CM | POA: Diagnosis not present

## 2017-09-03 DIAGNOSIS — Z7951 Long term (current) use of inhaled steroids: Secondary | ICD-10-CM | POA: Diagnosis not present

## 2017-09-03 DIAGNOSIS — N179 Acute kidney failure, unspecified: Secondary | ICD-10-CM | POA: Diagnosis not present

## 2017-09-03 DIAGNOSIS — E785 Hyperlipidemia, unspecified: Secondary | ICD-10-CM | POA: Diagnosis not present

## 2017-09-03 DIAGNOSIS — E039 Hypothyroidism, unspecified: Secondary | ICD-10-CM | POA: Diagnosis not present

## 2017-09-03 DIAGNOSIS — I5022 Chronic systolic (congestive) heart failure: Secondary | ICD-10-CM | POA: Diagnosis not present

## 2017-09-03 DIAGNOSIS — D649 Anemia, unspecified: Secondary | ICD-10-CM | POA: Diagnosis not present

## 2017-09-03 DIAGNOSIS — I4891 Unspecified atrial fibrillation: Secondary | ICD-10-CM | POA: Diagnosis not present

## 2017-09-03 DIAGNOSIS — I110 Hypertensive heart disease with heart failure: Secondary | ICD-10-CM | POA: Diagnosis not present

## 2017-09-03 DIAGNOSIS — I4892 Unspecified atrial flutter: Secondary | ICD-10-CM | POA: Diagnosis not present

## 2017-09-03 DIAGNOSIS — H409 Unspecified glaucoma: Secondary | ICD-10-CM | POA: Diagnosis not present

## 2017-09-03 DIAGNOSIS — J45909 Unspecified asthma, uncomplicated: Secondary | ICD-10-CM | POA: Diagnosis not present

## 2017-09-03 DIAGNOSIS — I447 Left bundle-branch block, unspecified: Secondary | ICD-10-CM | POA: Diagnosis not present

## 2017-09-03 DIAGNOSIS — M1990 Unspecified osteoarthritis, unspecified site: Secondary | ICD-10-CM | POA: Diagnosis not present

## 2017-09-03 DIAGNOSIS — G4733 Obstructive sleep apnea (adult) (pediatric): Secondary | ICD-10-CM | POA: Diagnosis not present

## 2017-09-03 DIAGNOSIS — Z7901 Long term (current) use of anticoagulants: Secondary | ICD-10-CM | POA: Diagnosis not present

## 2017-09-03 DIAGNOSIS — R2681 Unsteadiness on feet: Secondary | ICD-10-CM | POA: Diagnosis not present

## 2017-09-03 DIAGNOSIS — I129 Hypertensive chronic kidney disease with stage 1 through stage 4 chronic kidney disease, or unspecified chronic kidney disease: Secondary | ICD-10-CM | POA: Diagnosis not present

## 2017-09-03 DIAGNOSIS — K5790 Diverticulosis of intestine, part unspecified, without perforation or abscess without bleeding: Secondary | ICD-10-CM | POA: Diagnosis not present

## 2017-09-03 DIAGNOSIS — K529 Noninfective gastroenteritis and colitis, unspecified: Secondary | ICD-10-CM | POA: Diagnosis not present

## 2017-09-06 ENCOUNTER — Encounter: Payer: Self-pay | Admitting: Family Medicine

## 2017-09-06 ENCOUNTER — Ambulatory Visit (INDEPENDENT_AMBULATORY_CARE_PROVIDER_SITE_OTHER): Payer: Medicare Other | Admitting: Family Medicine

## 2017-09-06 VITALS — BP 100/50 | HR 60 | Temp 97.9°F | Ht 59.0 in | Wt 140.7 lb

## 2017-09-06 DIAGNOSIS — J189 Pneumonia, unspecified organism: Secondary | ICD-10-CM | POA: Diagnosis not present

## 2017-09-06 NOTE — Progress Notes (Signed)
HPI:  Using dictation device. Unfortunately this device frequently misinterprets words/phrases.  Morgan Clay is a pleasant 72 year old with a complicated past medical history significant for extensive cardiovascular disease, managed by her cardiologist, diabetes, history of asthma as a child, depression, treated hepatitis C, chronic kidney disease and recent hospitalization here for follow-up regarding pneumonia. We saw her for hospital follow-up recently.  She followed up much later than was advised after discharge.  She was having some dyspnea, so a chest x-ray was obtained which did show pneumonia.  I was not in the office when this report was addressed.  She was placed on Augmentin due to consideration for her renal function.  Also, her cardiologist has increased her Lasix dose. Reports she also saw her kidney specialist since her last visit. She will be getting and iron infusion to help with the anemia.  Today she reports feeling much better with improved energy, no shortness of breath, no cough and no fevers.  She has a bit of decreased appetite on the antibiotic, but otherwise is tolerating it well without any significant diarrhea or any vomiting.  Denies chest pain, dyspnea, orthopnea, fevers, cough.  ROS: See pertinent positives and negatives per HPI.  Past Medical History:  Diagnosis Date  . Arthritis   . Asthma    reports mild asthma since childhood - had COPD on dx list from prior PCP  . Atypical atrial flutter ([place]) 09/28/2015  . Chronic systolic congestive heart failure ([place])   . DEPRESSION 12/17/2009   Annotation: PHQ-9 score = 14 done on 12/17/2009 Qualifier: Diagnosis of  By: Hassell Done FNP, Tori Milks    . FIBROIDS, UTERUS 03/05/2008  . Glaucoma   . HEPATITIS C - s/p treatment with Harvoni, seeing hepatology, Dawn Drazek 01/04/2007  . Hyperlipemia 12/06/2012  . HYPERTENSION, BENIGN 04/24/2007  . Hypothyroidism   . LBBB (left bundle branch block)   . Nonischemic cardiomyopathy  ([place])   . OBESITY 05/27/2009  . OBSTRUCTIVE SLEEP APNEA 11/14/2007   npsg 2009:  Mild osa with AHI 11/hr. Started cpap 2009, quit using due to lack of response Home sleep testing 09/2010 with AHI only 7/hr and weight loss advised by pulmonology.  . OSTEOPENIA 09/30/2008  . Personal history of other infectious and parasitic disease    Hepatitis B    Past Surgical History:  Procedure Laterality Date  . appendectomy    . CARDIAC CATHETERIZATION    . CARDIOVERSION N/A 12/14/2014   Procedure: CARDIOVERSION;  Surgeon: Dorothy Spark, MD;  Location: [facility] ENDOSCOPY;  Service: Cardiovascular;  Laterality: N/A;  . CARDIOVERSION N/A 02/26/2017   Procedure: CARDIOVERSION;  Surgeon: Evans Lance, MD;  Location: [place] CV LAB;  Service: Cardiovascular;  Laterality: N/A;  . CARDIOVERSION N/A 07/24/2017   Procedure: CARDIOVERSION;  Surgeon: Thayer Headings, MD;  Location: [facility] ENDOSCOPY;  Service: Cardiovascular;  Laterality: N/A;  . EP IMPLANTABLE DEVICE N/A 10/06/2015   Procedure: BIV ICD Generator Changeout;  Surgeon: [name], MD;  Location: [place] CV LAB;  Service: Cardiovascular;  Laterality: N/A;    Family History  Problem Relation Age of Onset  . Asthma Father   . Heart attack Father   . Asthma Sister   . Cancer Sister        lung  . Heart attack Mother   . Stroke Brother   . Diabetes Unknown     SOCIAL HX: see hpi   Current Outpatient Medications:  .  albuterol (PROVENTIL HFA;VENTOLIN HFA) 108 (90 BASE) MCG/ACT inhaler,  Inhale 2 puffs into the lungs every 6 (six) hours as needed for wheezing or shortness of breath., Disp: 1 Inhaler, Rfl: 3 .  amiodarone (PACERONE) 200 MG tablet, 2/20-2/26 take 2 tablets, 400mg  twice/day 2/27-3/19- take 1 tablet, 200mg  twice/day March 20- one tablet, 200mg  daily (Patient taking differently: Take 200 mg by mouth 2 (two) times daily. 2/20-2/26 take 2 tablets, 400mg  twice/day 2/27-3/19- take 1 tablet, 200mg  twice/day March 20- one tablet,  200mg  daily), Disp: 90 tablet, Rfl: 3 .  amoxicillin-clavulanate (AUGMENTIN) 500-125 MG tablet, Take 1 tablet (500 mg total) by mouth 2 (two) times daily., Disp: 20 tablet, Rfl: 0 .  BIDIL 20-37.5 MG tablet, TAKE 1 TABLET BY MOUTH TWICE DAILY (Patient taking differently: TAKE 1 TABLET (37.5 mg) BY MOUTH TWICE DAILY), Disp: 180 tablet, Rfl: 3 .  carvedilol (COREG) 12.5 MG tablet, Take 1 tablet (12.5 mg total) by mouth 2 (two) times daily with a meal., Disp: 60 tablet, Rfl: 0 .  ELIQUIS 5 MG TABS tablet, TAKE ONE TABLET BY MOUTH TWICE DAILY, Disp: 180 tablet, Rfl: 3 .  furosemide (LASIX) 40 MG tablet, Take 1 tablet (40 mg total) by mouth 2 (two) times daily., Disp: 60 tablet, Rfl: 0 .  Hydrocortisone (GERHARDT'S BUTT CREAM) CREA, Apply 1 application topically 2 (two) times daily., Disp: 1 each, Rfl: 2 .  levothyroxine (SYNTHROID, LEVOTHROID) 125 MCG tablet, Take 1 tablet (125 mcg total) by mouth daily. (Patient taking differently: Take 125 mcg by mouth daily before breakfast. ), Disp: 90 tablet, Rfl: 0 .  nitroGLYCERIN (NITROSTAT) 0.4 MG SL tablet, Place 0.4 mg under the tongue every 5 (five) minutes as needed for chest pain., Disp: , Rfl:  .  pantoprazole (PROTONIX) 40 MG tablet, Take 1 tablet (40 mg total) by mouth daily., Disp: 30 tablet, Rfl: 0 .  sertraline (ZOLOFT) 50 MG tablet, Take 1 tablet (50 mg total) by mouth daily., Disp: 30 tablet, Rfl: 3 .  spironolactone (ALDACTONE) 25 MG tablet, Take 1 tablet (25 mg total) daily by mouth., Disp: 90 tablet, Rfl: 3 .  Tetrahydrozoline HCl (VISINE OP), Place 1 drop into both eyes daily as needed (DRY EYES)., Disp: , Rfl:  .  traMADol (ULTRAM) 50 MG tablet, Take 2 tablets (100 mg total) by mouth every 12 (twelve) hours as needed (mild pain. use Tylenol first)., Disp: 8 tablet, Rfl: 0 .  traZODone (DESYREL) 50 MG tablet, Take 1 tablet (50 mg total) by mouth at bedtime. (Patient taking differently: Take 50 mg by mouth at bedtime as needed for sleep. ), Disp:  90 tablet, Rfl: 3  EXAM:  Vitals:   09/06/17 1442  BP: (!) 100/50  Pulse: 60  Temp: 97.9 F (36.6 C)  SpO2: 97%    Body mass index is 28.42 kg/m.  GENERAL: vitals reviewed and listed above, alert, oriented, appears well hydrated and in no acute distress  HEENT: atraumatic, conjunttiva clear, no obvious abnormalities on inspection of external nose and ears  NECK: no obvious masses on inspection  LUNGS: clear to auscultation bilaterally, no wheezes, rales or rhonchi, good air movement  CV: HRRR, no peripheral edema  MS: moves all extremities without noticeable abnormality  PSYCH: pleasant and cooperative, no obvious depression or anxiety  ASSESSMENT AND PLAN:  Discussed the following assessment and plan:  Pneumonia due to infectious organism, unspecified laterality, unspecified part of lung - Plan: DG Chest 2 View  -I am glad she is  feeling better  -Recommended cardiology follow-up -reports is scheduled for later this  month -Advised to repeat a chest x-ray in about 4 to 6 weeks, orders placed -Patient advised to return or notify a doctor immediately if symptoms worsen or persist or new concerns arise.  Patient Instructions  BEFORE YOU LEAVE: X-ray sheet to get x-ray in 4 to 6 weeks -follow up: About 4 months  I am glad you are feeling better.  Seek care promptly if your symptoms worsen, new concerns arise or you are not improving further with treatment.      [name], DO

## 2017-09-06 NOTE — Patient Instructions (Signed)
BEFORE YOU LEAVE: X-ray sheet to get x-ray in 4 to 6 weeks -follow up: About 4 months  I am glad you are feeling better.  Seek care promptly if your symptoms worsen, new concerns arise or you are not improving further with treatment.

## 2017-09-07 DIAGNOSIS — M1990 Unspecified osteoarthritis, unspecified site: Secondary | ICD-10-CM | POA: Diagnosis not present

## 2017-09-07 DIAGNOSIS — I429 Cardiomyopathy, unspecified: Secondary | ICD-10-CM | POA: Diagnosis not present

## 2017-09-07 DIAGNOSIS — E785 Hyperlipidemia, unspecified: Secondary | ICD-10-CM | POA: Diagnosis not present

## 2017-09-07 DIAGNOSIS — E119 Type 2 diabetes mellitus without complications: Secondary | ICD-10-CM | POA: Diagnosis not present

## 2017-09-07 DIAGNOSIS — I4892 Unspecified atrial flutter: Secondary | ICD-10-CM | POA: Diagnosis not present

## 2017-09-07 DIAGNOSIS — Z7951 Long term (current) use of inhaled steroids: Secondary | ICD-10-CM | POA: Diagnosis not present

## 2017-09-07 DIAGNOSIS — I4891 Unspecified atrial fibrillation: Secondary | ICD-10-CM | POA: Diagnosis not present

## 2017-09-07 DIAGNOSIS — Z7901 Long term (current) use of anticoagulants: Secondary | ICD-10-CM | POA: Diagnosis not present

## 2017-09-07 DIAGNOSIS — G4733 Obstructive sleep apnea (adult) (pediatric): Secondary | ICD-10-CM | POA: Diagnosis not present

## 2017-09-07 DIAGNOSIS — J45909 Unspecified asthma, uncomplicated: Secondary | ICD-10-CM | POA: Diagnosis not present

## 2017-09-07 DIAGNOSIS — I110 Hypertensive heart disease with heart failure: Secondary | ICD-10-CM | POA: Diagnosis not present

## 2017-09-07 DIAGNOSIS — I447 Left bundle-branch block, unspecified: Secondary | ICD-10-CM | POA: Diagnosis not present

## 2017-09-07 DIAGNOSIS — H409 Unspecified glaucoma: Secondary | ICD-10-CM | POA: Diagnosis not present

## 2017-09-07 DIAGNOSIS — I5022 Chronic systolic (congestive) heart failure: Secondary | ICD-10-CM | POA: Diagnosis not present

## 2017-09-07 DIAGNOSIS — E039 Hypothyroidism, unspecified: Secondary | ICD-10-CM | POA: Diagnosis not present

## 2017-09-07 DIAGNOSIS — K5790 Diverticulosis of intestine, part unspecified, without perforation or abscess without bleeding: Secondary | ICD-10-CM | POA: Diagnosis not present

## 2017-09-07 DIAGNOSIS — R2681 Unsteadiness on feet: Secondary | ICD-10-CM | POA: Diagnosis not present

## 2017-09-07 DIAGNOSIS — K529 Noninfective gastroenteritis and colitis, unspecified: Secondary | ICD-10-CM | POA: Diagnosis not present

## 2017-09-10 ENCOUNTER — Ambulatory Visit: Payer: Medicare Other | Admitting: Family Medicine

## 2017-09-11 DIAGNOSIS — I110 Hypertensive heart disease with heart failure: Secondary | ICD-10-CM | POA: Diagnosis not present

## 2017-09-11 DIAGNOSIS — I4891 Unspecified atrial fibrillation: Secondary | ICD-10-CM | POA: Diagnosis not present

## 2017-09-11 DIAGNOSIS — Z7951 Long term (current) use of inhaled steroids: Secondary | ICD-10-CM | POA: Diagnosis not present

## 2017-09-11 DIAGNOSIS — I5022 Chronic systolic (congestive) heart failure: Secondary | ICD-10-CM | POA: Diagnosis not present

## 2017-09-11 DIAGNOSIS — I447 Left bundle-branch block, unspecified: Secondary | ICD-10-CM | POA: Diagnosis not present

## 2017-09-11 DIAGNOSIS — R2681 Unsteadiness on feet: Secondary | ICD-10-CM | POA: Diagnosis not present

## 2017-09-11 DIAGNOSIS — Z7901 Long term (current) use of anticoagulants: Secondary | ICD-10-CM | POA: Diagnosis not present

## 2017-09-11 DIAGNOSIS — G4733 Obstructive sleep apnea (adult) (pediatric): Secondary | ICD-10-CM | POA: Diagnosis not present

## 2017-09-11 DIAGNOSIS — M1990 Unspecified osteoarthritis, unspecified site: Secondary | ICD-10-CM | POA: Diagnosis not present

## 2017-09-11 DIAGNOSIS — J45909 Unspecified asthma, uncomplicated: Secondary | ICD-10-CM | POA: Diagnosis not present

## 2017-09-11 DIAGNOSIS — I429 Cardiomyopathy, unspecified: Secondary | ICD-10-CM | POA: Diagnosis not present

## 2017-09-11 DIAGNOSIS — I4892 Unspecified atrial flutter: Secondary | ICD-10-CM | POA: Diagnosis not present

## 2017-09-11 DIAGNOSIS — E039 Hypothyroidism, unspecified: Secondary | ICD-10-CM | POA: Diagnosis not present

## 2017-09-11 DIAGNOSIS — K529 Noninfective gastroenteritis and colitis, unspecified: Secondary | ICD-10-CM | POA: Diagnosis not present

## 2017-09-11 DIAGNOSIS — H409 Unspecified glaucoma: Secondary | ICD-10-CM | POA: Diagnosis not present

## 2017-09-11 DIAGNOSIS — K5790 Diverticulosis of intestine, part unspecified, without perforation or abscess without bleeding: Secondary | ICD-10-CM | POA: Diagnosis not present

## 2017-09-11 DIAGNOSIS — E785 Hyperlipidemia, unspecified: Secondary | ICD-10-CM | POA: Diagnosis not present

## 2017-09-11 DIAGNOSIS — E119 Type 2 diabetes mellitus without complications: Secondary | ICD-10-CM | POA: Diagnosis not present

## 2017-09-12 DIAGNOSIS — I429 Cardiomyopathy, unspecified: Secondary | ICD-10-CM | POA: Diagnosis not present

## 2017-09-12 DIAGNOSIS — K5790 Diverticulosis of intestine, part unspecified, without perforation or abscess without bleeding: Secondary | ICD-10-CM | POA: Diagnosis not present

## 2017-09-12 DIAGNOSIS — E785 Hyperlipidemia, unspecified: Secondary | ICD-10-CM | POA: Diagnosis not present

## 2017-09-12 DIAGNOSIS — J45909 Unspecified asthma, uncomplicated: Secondary | ICD-10-CM | POA: Diagnosis not present

## 2017-09-12 DIAGNOSIS — Z7901 Long term (current) use of anticoagulants: Secondary | ICD-10-CM | POA: Diagnosis not present

## 2017-09-12 DIAGNOSIS — H409 Unspecified glaucoma: Secondary | ICD-10-CM | POA: Diagnosis not present

## 2017-09-12 DIAGNOSIS — R2681 Unsteadiness on feet: Secondary | ICD-10-CM | POA: Diagnosis not present

## 2017-09-12 DIAGNOSIS — I447 Left bundle-branch block, unspecified: Secondary | ICD-10-CM | POA: Diagnosis not present

## 2017-09-12 DIAGNOSIS — Z7951 Long term (current) use of inhaled steroids: Secondary | ICD-10-CM | POA: Diagnosis not present

## 2017-09-12 DIAGNOSIS — E119 Type 2 diabetes mellitus without complications: Secondary | ICD-10-CM | POA: Diagnosis not present

## 2017-09-12 DIAGNOSIS — I5022 Chronic systolic (congestive) heart failure: Secondary | ICD-10-CM | POA: Diagnosis not present

## 2017-09-12 DIAGNOSIS — M1990 Unspecified osteoarthritis, unspecified site: Secondary | ICD-10-CM | POA: Diagnosis not present

## 2017-09-12 DIAGNOSIS — I110 Hypertensive heart disease with heart failure: Secondary | ICD-10-CM | POA: Diagnosis not present

## 2017-09-12 DIAGNOSIS — G4733 Obstructive sleep apnea (adult) (pediatric): Secondary | ICD-10-CM | POA: Diagnosis not present

## 2017-09-12 DIAGNOSIS — I4891 Unspecified atrial fibrillation: Secondary | ICD-10-CM | POA: Diagnosis not present

## 2017-09-12 DIAGNOSIS — E039 Hypothyroidism, unspecified: Secondary | ICD-10-CM | POA: Diagnosis not present

## 2017-09-12 DIAGNOSIS — K529 Noninfective gastroenteritis and colitis, unspecified: Secondary | ICD-10-CM | POA: Diagnosis not present

## 2017-09-12 DIAGNOSIS — I4892 Unspecified atrial flutter: Secondary | ICD-10-CM | POA: Diagnosis not present

## 2017-09-13 ENCOUNTER — Ambulatory Visit (INDEPENDENT_AMBULATORY_CARE_PROVIDER_SITE_OTHER): Payer: Medicare Other

## 2017-09-13 DIAGNOSIS — Z95810 Presence of automatic (implantable) cardiac defibrillator: Secondary | ICD-10-CM | POA: Diagnosis not present

## 2017-09-13 DIAGNOSIS — I4891 Unspecified atrial fibrillation: Secondary | ICD-10-CM | POA: Diagnosis not present

## 2017-09-13 DIAGNOSIS — K5790 Diverticulosis of intestine, part unspecified, without perforation or abscess without bleeding: Secondary | ICD-10-CM | POA: Diagnosis not present

## 2017-09-13 DIAGNOSIS — G4733 Obstructive sleep apnea (adult) (pediatric): Secondary | ICD-10-CM

## 2017-09-13 DIAGNOSIS — I110 Hypertensive heart disease with heart failure: Secondary | ICD-10-CM | POA: Diagnosis not present

## 2017-09-13 DIAGNOSIS — H409 Unspecified glaucoma: Secondary | ICD-10-CM | POA: Diagnosis not present

## 2017-09-13 DIAGNOSIS — I5022 Chronic systolic (congestive) heart failure: Secondary | ICD-10-CM | POA: Diagnosis not present

## 2017-09-13 DIAGNOSIS — I4892 Unspecified atrial flutter: Secondary | ICD-10-CM | POA: Diagnosis not present

## 2017-09-13 DIAGNOSIS — J45909 Unspecified asthma, uncomplicated: Secondary | ICD-10-CM | POA: Diagnosis not present

## 2017-09-13 DIAGNOSIS — Z7901 Long term (current) use of anticoagulants: Secondary | ICD-10-CM

## 2017-09-13 DIAGNOSIS — I447 Left bundle-branch block, unspecified: Secondary | ICD-10-CM

## 2017-09-13 DIAGNOSIS — M1990 Unspecified osteoarthritis, unspecified site: Secondary | ICD-10-CM | POA: Diagnosis not present

## 2017-09-13 DIAGNOSIS — K529 Noninfective gastroenteritis and colitis, unspecified: Secondary | ICD-10-CM | POA: Diagnosis not present

## 2017-09-13 DIAGNOSIS — Z7951 Long term (current) use of inhaled steroids: Secondary | ICD-10-CM

## 2017-09-13 DIAGNOSIS — B1920 Unspecified viral hepatitis C without hepatic coma: Secondary | ICD-10-CM

## 2017-09-13 DIAGNOSIS — E039 Hypothyroidism, unspecified: Secondary | ICD-10-CM

## 2017-09-13 DIAGNOSIS — I429 Cardiomyopathy, unspecified: Secondary | ICD-10-CM

## 2017-09-13 DIAGNOSIS — F329 Major depressive disorder, single episode, unspecified: Secondary | ICD-10-CM | POA: Diagnosis not present

## 2017-09-13 DIAGNOSIS — E119 Type 2 diabetes mellitus without complications: Secondary | ICD-10-CM | POA: Diagnosis not present

## 2017-09-13 DIAGNOSIS — E785 Hyperlipidemia, unspecified: Secondary | ICD-10-CM

## 2017-09-13 DIAGNOSIS — R2681 Unsteadiness on feet: Secondary | ICD-10-CM | POA: Diagnosis not present

## 2017-09-13 NOTE — Progress Notes (Signed)
EPIC Encounter for ICM Monitoring  Patient Name: Morgan Clay is a 72 y.o. female Date: 09/13/2017 Primary Care Physican: [name], DO Primary [place] Electrophysiologist: Caryl Comes Dry Weight: 137 lbs (Baseline135-140 lbs) Bi-V Pacing: 99.9%         Heart Failure questions reviewed, pt asymptomatic since being discharged from hospital. Hospitalized 3/26 to 4/12 for pneumonia and abdominal pain which correlates with decreased impedance.    Thoracic impedance abnormal suggesting fluid accumulation since 07/12/2017 but is trending back close to baseline.  Prescribed dosage: Furosemide 40 mg 1tablet(40 mg total)twice a day.   Labs: 06/07/2017 Creatinine 1.76, BUN 54, Potassium 4.3, Sodium 140, EGFR 36.78 03/06/2017 Creatinine1.53, BUN28, Potassium4.2, Sodium140, EGFR34-40 03/01/2017 Creatinine1.64, BUN39, Potassium3.7, CLTVTW242, [id number]  02/28/2017 Creatinine1.82, BUN40, Potassium4.2, Sodium134, [id number]  02/27/2017 Creatinine1.69, BUN33, Potassium3.6, Sodium135, EGFR30-35  02/26/2017 Creatinine1.85, BUN31, Potassium3.7, Sodium130, [id number]  02/25/2017 Creatinine1.85, BUN28, Potassium3.8, Sodium132, [id number]  02/24/2017 Creatinine1.68, BUN23, Potassium3.9, Sodium135, [id number]  02/23/2017 Creatinine1.45, BUN17, Potassium3.2, AWNOPW256, PRKS84-57  02/22/2017 Creatinine1.37, BUN14, Potassium3.6, NRWKET015, [id number] 12/20/2016 Creatinine 1.2, BUN 21, Potassium 3.6, Sodium 141 09/26/2016 Creatinine 1.29, BUN 27, Potassium 3.7, Sodium 139, EGFR 52.75  05/30/2016 Creatinine 1.24, BUN 19, Potassium 3.8, Sodium 139, EGFR 55.26  Recommendations: No changes.  Encouraged to call for fluid symptoms.  Follow-up plan: ICM clinic phone appointment on 09/24/2017 to recheck fluid levels.  Office appointment scheduled 10/04/2017 with Dr. Caryl Comes.  Copy of ICM check sent to Dr. Caryl Comes.   3 month ICM trend: 09/13/2017    1  Year ICM trend:       [name], RN 09/13/2017 2:37 PM

## 2017-09-14 ENCOUNTER — Other Ambulatory Visit (HOSPITAL_COMMUNITY): Payer: Self-pay

## 2017-09-17 ENCOUNTER — Ambulatory Visit (HOSPITAL_COMMUNITY)
Admission: RE | Admit: 2017-09-17 | Discharge: 2017-09-17 | Disposition: A | Discharge status: Home / Self Care | Payer: Medicare Other | Source: Ambulatory Visit | Attending: Nephrology | Admitting: Nephrology

## 2017-09-17 DIAGNOSIS — D631 Anemia in chronic kidney disease: Secondary | ICD-10-CM | POA: Insufficient documentation

## 2017-09-17 DIAGNOSIS — N189 Chronic kidney disease, unspecified: Secondary | ICD-10-CM | POA: Insufficient documentation

## 2017-09-17 MED ORDER — FERUMOXYTOL INJECTION 510 MG/17 ML
510.0000 mg | Freq: Once | INTRAVENOUS | Status: AC
  Administered 2017-09-17: 510 mg via INTRAVENOUS
  Filled 2017-09-17: qty 17

## 2017-09-17 NOTE — Discharge Instructions (Signed)

## 2017-09-18 DIAGNOSIS — J45909 Unspecified asthma, uncomplicated: Secondary | ICD-10-CM | POA: Diagnosis not present

## 2017-09-18 DIAGNOSIS — Z7901 Long term (current) use of anticoagulants: Secondary | ICD-10-CM | POA: Diagnosis not present

## 2017-09-18 DIAGNOSIS — I429 Cardiomyopathy, unspecified: Secondary | ICD-10-CM | POA: Diagnosis not present

## 2017-09-18 DIAGNOSIS — E119 Type 2 diabetes mellitus without complications: Secondary | ICD-10-CM | POA: Diagnosis not present

## 2017-09-18 DIAGNOSIS — Z7951 Long term (current) use of inhaled steroids: Secondary | ICD-10-CM | POA: Diagnosis not present

## 2017-09-18 DIAGNOSIS — I4891 Unspecified atrial fibrillation: Secondary | ICD-10-CM | POA: Diagnosis not present

## 2017-09-18 DIAGNOSIS — G4733 Obstructive sleep apnea (adult) (pediatric): Secondary | ICD-10-CM | POA: Diagnosis not present

## 2017-09-18 DIAGNOSIS — K529 Noninfective gastroenteritis and colitis, unspecified: Secondary | ICD-10-CM | POA: Diagnosis not present

## 2017-09-18 DIAGNOSIS — R2681 Unsteadiness on feet: Secondary | ICD-10-CM | POA: Diagnosis not present

## 2017-09-18 DIAGNOSIS — I110 Hypertensive heart disease with heart failure: Secondary | ICD-10-CM | POA: Diagnosis not present

## 2017-09-18 DIAGNOSIS — E785 Hyperlipidemia, unspecified: Secondary | ICD-10-CM | POA: Diagnosis not present

## 2017-09-18 DIAGNOSIS — K5790 Diverticulosis of intestine, part unspecified, without perforation or abscess without bleeding: Secondary | ICD-10-CM | POA: Diagnosis not present

## 2017-09-18 DIAGNOSIS — I4892 Unspecified atrial flutter: Secondary | ICD-10-CM | POA: Diagnosis not present

## 2017-09-18 DIAGNOSIS — E039 Hypothyroidism, unspecified: Secondary | ICD-10-CM | POA: Diagnosis not present

## 2017-09-18 DIAGNOSIS — I447 Left bundle-branch block, unspecified: Secondary | ICD-10-CM | POA: Diagnosis not present

## 2017-09-18 DIAGNOSIS — I5022 Chronic systolic (congestive) heart failure: Secondary | ICD-10-CM | POA: Diagnosis not present

## 2017-09-18 DIAGNOSIS — M1990 Unspecified osteoarthritis, unspecified site: Secondary | ICD-10-CM | POA: Diagnosis not present

## 2017-09-18 DIAGNOSIS — H409 Unspecified glaucoma: Secondary | ICD-10-CM | POA: Diagnosis not present

## 2017-09-20 ENCOUNTER — Telehealth: Payer: Self-pay | Admitting: *Deleted

## 2017-09-20 DIAGNOSIS — K5790 Diverticulosis of intestine, part unspecified, without perforation or abscess without bleeding: Secondary | ICD-10-CM | POA: Diagnosis not present

## 2017-09-20 DIAGNOSIS — I5022 Chronic systolic (congestive) heart failure: Secondary | ICD-10-CM | POA: Diagnosis not present

## 2017-09-20 DIAGNOSIS — Z7951 Long term (current) use of inhaled steroids: Secondary | ICD-10-CM | POA: Diagnosis not present

## 2017-09-20 DIAGNOSIS — J45909 Unspecified asthma, uncomplicated: Secondary | ICD-10-CM | POA: Diagnosis not present

## 2017-09-20 DIAGNOSIS — K529 Noninfective gastroenteritis and colitis, unspecified: Secondary | ICD-10-CM | POA: Diagnosis not present

## 2017-09-20 DIAGNOSIS — E119 Type 2 diabetes mellitus without complications: Secondary | ICD-10-CM | POA: Diagnosis not present

## 2017-09-20 DIAGNOSIS — Z7901 Long term (current) use of anticoagulants: Secondary | ICD-10-CM | POA: Diagnosis not present

## 2017-09-20 DIAGNOSIS — G4733 Obstructive sleep apnea (adult) (pediatric): Secondary | ICD-10-CM | POA: Diagnosis not present

## 2017-09-20 DIAGNOSIS — I429 Cardiomyopathy, unspecified: Secondary | ICD-10-CM | POA: Diagnosis not present

## 2017-09-20 DIAGNOSIS — I447 Left bundle-branch block, unspecified: Secondary | ICD-10-CM | POA: Diagnosis not present

## 2017-09-20 DIAGNOSIS — H409 Unspecified glaucoma: Secondary | ICD-10-CM | POA: Diagnosis not present

## 2017-09-20 DIAGNOSIS — M1990 Unspecified osteoarthritis, unspecified site: Secondary | ICD-10-CM | POA: Diagnosis not present

## 2017-09-20 DIAGNOSIS — I4891 Unspecified atrial fibrillation: Secondary | ICD-10-CM | POA: Diagnosis not present

## 2017-09-20 DIAGNOSIS — E039 Hypothyroidism, unspecified: Secondary | ICD-10-CM | POA: Diagnosis not present

## 2017-09-20 DIAGNOSIS — I4892 Unspecified atrial flutter: Secondary | ICD-10-CM | POA: Diagnosis not present

## 2017-09-20 DIAGNOSIS — R2681 Unsteadiness on feet: Secondary | ICD-10-CM | POA: Diagnosis not present

## 2017-09-20 DIAGNOSIS — I110 Hypertensive heart disease with heart failure: Secondary | ICD-10-CM | POA: Diagnosis not present

## 2017-09-20 DIAGNOSIS — E785 Hyperlipidemia, unspecified: Secondary | ICD-10-CM | POA: Diagnosis not present

## 2017-09-20 NOTE — Telephone Encounter (Signed)
[facility] faxed a refill request for Pantoprazole 40mg -take 1 tablet by mouth once daily and this was not given by Dr [name].

## 2017-09-21 MED ORDER — PANTOPRAZOLE SODIUM 20 MG PO TBEC: 20.0000 mg | DELAYED_RELEASE_TABLET | Freq: Every day | ORAL | 0 refills | Status: DC

## 2017-09-21 NOTE — Telephone Encounter (Signed)
It looks like this was added in the hospital. We do not usually recommend continuing a high dose of this unless there has bee GI bleeding, ulcer or erosion. Please call pt to see if she recalls why this was prescribed. Would recommend decreasing to 20mg  daily. Send new rx. Thanks.

## 2017-09-21 NOTE — Telephone Encounter (Signed)
I called the pt and informed her of the message below.  She is aware the Rx for 20mg  was sent to the pharmacy.

## 2017-09-24 ENCOUNTER — Ambulatory Visit (INDEPENDENT_AMBULATORY_CARE_PROVIDER_SITE_OTHER): Payer: Medicare Other

## 2017-09-24 DIAGNOSIS — I5022 Chronic systolic (congestive) heart failure: Secondary | ICD-10-CM

## 2017-09-24 DIAGNOSIS — Z95810 Presence of automatic (implantable) cardiac defibrillator: Secondary | ICD-10-CM

## 2017-09-24 NOTE — Progress Notes (Signed)
EPIC Encounter for ICM Monitoring  Patient Name: Morgan Clay is a 72 y.o. female Date: 09/24/2017 Primary Care Physican: [name], DO [facility] [place] Electrophysiologist: Caryl Comes Dry Weight: Previous weight 137 lbs (Baseline135-140 lbs) Bi-V Pacing: 99.9%        Attempted call to patient and unable to reach.   Was hospitalized in April for pneumonia   Thoracic impedance returned to normal since 09/13/2017.  Prescribed dosage: Furosemide 40 mg 1tablet(40 mg total)twice a day.   Labs: 06/07/2017 Creatinine 1.76, BUN 54, Potassium 4.3, Sodium 140, EGFR 36.78 03/06/2017 Creatinine1.53, BUN28, Potassium4.2, Sodium140, [id number] 03/01/2017 Creatinine1.64, BUN39, Potassium3.7, [id number], [id number]  02/28/2017 Creatinine1.82, BUN40, Potassium4.2, Sodium134, [id number]  02/27/2017 Creatinine1.69, BUN33, Potassium3.6, Sodium135, EGFR30-35  02/26/2017 Creatinine1.85, BUN31, Potassium3.7, Sodium130, [id number]  02/25/2017 Creatinine1.85, BUN28, Potassium3.8, Sodium132, [id number]  02/24/2017 Creatinine1.68, BUN23, Potassium3.9, Sodium135, [id number]  02/23/2017 Creatinine1.45, BUN17, Potassium3.2, [id number], [id number]  02/22/2017 Creatinine1.37, BUN14, Potassium3.6, [id number], JSHF02-63 12/20/2016 Creatinine 1.2, BUN 21, Potassium 3.6, Sodium 141 09/26/2016 Creatinine 1.29, BUN 27, Potassium 3.7, Sodium 139, EGFR 52.75  05/30/2016 Creatinine 1.24, BUN 19, Potassium 3.8, Sodium 139, EGFR 55.26  Recommendation: NONE - Unable to reach.  Follow-up plan: ICM clinic phone appointment on 11/05/2017.  Office appointment scheduled 10/04/2017 with Dr. Caryl Comes.  Copy of ICM check sent to Dr. Caryl Comes  3 month ICM trend: 09/24/2017    1 Year ICM trend:       [name], RN 09/24/2017 4:36 PM

## 2017-09-25 ENCOUNTER — Telehealth: Payer: Self-pay | Admitting: Family Medicine

## 2017-09-25 ENCOUNTER — Telehealth: Payer: Self-pay

## 2017-09-25 NOTE — Telephone Encounter (Signed)
I left a detailed message at the voicemail below stating our office cannot fax this to an insurance company.

## 2017-09-25 NOTE — Telephone Encounter (Signed)
Remote ICM transmission received.  Attempted call to patient and no message left.

## 2017-09-25 NOTE — Telephone Encounter (Signed)
Copied from [place] [id number]. Topic: Quick Communication - See Telephone Encounter >> Sep 25, 2017 10:08 AM Aurelio Brash B wrote: CRM for notification. See Telephone encounter for: 09/25/17.   Lauren from [facility] Is asking for discharge summary with list of pts medication    contact [phone] ext [phone]     fax  [phone]

## 2017-10-04 ENCOUNTER — Ambulatory Visit (INDEPENDENT_AMBULATORY_CARE_PROVIDER_SITE_OTHER): Payer: Medicare Other | Admitting: Internal Medicine

## 2017-10-04 ENCOUNTER — Encounter (INDEPENDENT_AMBULATORY_CARE_PROVIDER_SITE_OTHER): Payer: Self-pay

## 2017-10-04 ENCOUNTER — Encounter: Payer: Self-pay | Admitting: Internal Medicine

## 2017-10-04 ENCOUNTER — Encounter

## 2017-10-04 VITALS — BP 130/58 | HR 60 | Ht 59.0 in | Wt 141.0 lb

## 2017-10-04 DIAGNOSIS — Z95810 Presence of automatic (implantable) cardiac defibrillator: Secondary | ICD-10-CM

## 2017-10-04 DIAGNOSIS — I4819 Other persistent atrial fibrillation: Secondary | ICD-10-CM

## 2017-10-04 DIAGNOSIS — I481 Persistent atrial fibrillation: Secondary | ICD-10-CM

## 2017-10-04 DIAGNOSIS — D649 Anemia, unspecified: Secondary | ICD-10-CM | POA: Diagnosis not present

## 2017-10-04 DIAGNOSIS — I5022 Chronic systolic (congestive) heart failure: Secondary | ICD-10-CM

## 2017-10-04 DIAGNOSIS — I428 Other cardiomyopathies: Secondary | ICD-10-CM | POA: Diagnosis not present

## 2017-10-04 LAB — CUP PACEART INCLINIC DEVICE CHECK
Brady Statistic AP VS Percent: 0.02 %
Brady Statistic AS VP Percent: 57.13 %
Brady Statistic AS VS Percent: 0.29 %
Date Time Interrogation Session: 20190530180242
HIGH POWER IMPEDANCE MEASURED VALUE: 40 Ohm
HIGH POWER IMPEDANCE MEASURED VALUE: 64 Ohm
Implantable Lead Implant Date: 20080818
Implantable Lead Location: 753859
Implantable Lead Location: 753860
Lead Channel Impedance Value: 266 Ohm
Lead Channel Impedance Value: 380 Ohm
Lead Channel Impedance Value: 4047 Ohm
Lead Channel Pacing Threshold Amplitude: 0.75 V
Lead Channel Pacing Threshold Pulse Width: 0.4 ms
Lead Channel Sensing Intrinsic Amplitude: 11.375 mV
Lead Channel Setting Pacing Amplitude: 2.5 V
Lead Channel Setting Pacing Pulse Width: 0.4 ms
MDC IDC LEAD IMPLANT DT: 20080818
MDC IDC LEAD IMPLANT DT: 20080818
MDC IDC LEAD LOCATION: 753858
MDC IDC MSMT BATTERY REMAINING LONGEVITY: 67 mo
MDC IDC MSMT BATTERY VOLTAGE: 2.98 V
MDC IDC MSMT LEADCHNL LV IMPEDANCE VALUE: 4047 Ohm
MDC IDC MSMT LEADCHNL LV PACING THRESHOLD AMPLITUDE: 1 V
MDC IDC MSMT LEADCHNL RA IMPEDANCE VALUE: 437 Ohm
MDC IDC MSMT LEADCHNL RA PACING THRESHOLD PULSEWIDTH: 0.4 ms
MDC IDC MSMT LEADCHNL RA SENSING INTR AMPL: 3.625 mV
MDC IDC MSMT LEADCHNL RV IMPEDANCE VALUE: 323 Ohm
MDC IDC MSMT LEADCHNL RV PACING THRESHOLD AMPLITUDE: 1 V
MDC IDC MSMT LEADCHNL RV PACING THRESHOLD PULSEWIDTH: 0.4 ms
MDC IDC PG IMPLANT DT: 20170531
MDC IDC SET LEADCHNL LV PACING AMPLITUDE: 1.5 V
MDC IDC SET LEADCHNL LV PACING PULSEWIDTH: 0.4 ms
MDC IDC SET LEADCHNL RA PACING AMPLITUDE: 1.75 V
MDC IDC SET LEADCHNL RV SENSING SENSITIVITY: 0.3 mV
MDC IDC STAT BRADY AP VP PERCENT: 42.55 %
MDC IDC STAT BRADY RA PERCENT PACED: 42.56 %
MDC IDC STAT BRADY RV PERCENT PACED: 99.61 %

## 2017-10-04 NOTE — Patient Instructions (Signed)
Medication Instructions:  Your physician recommends that you continue on your current medications as directed. Please refer to the Current Medication list given to you today.  Labwork: You will have labs drawn today: CBC, Ferratin, Iron Binding Cap  Testing/Procedures: None ordered.  Follow-Up: Your physician wants you to follow-up in: One Year with Dr Caryl Comes. You will receive a reminder letter in the mail two months in advance. If you don't receive a letter, please call our office to schedule the follow-up appointment.  Remote monitoring is used to monitor your Pacemaker of ICD from home. This monitoring reduces the number of office visits required to check your device to one time per year. It allows [name] to keep an eye on the functioning of your device to ensure it is working properly. You are scheduled for a device check from home on 11/05/2017. You may send your transmission at any time that day. If you have a wireless device, the transmission will be sent automatically. After your physician reviews your transmission, you will receive a postcard with your next transmission date.    Any Other Special Instructions Will Be Listed Below (If Applicable).     If you need a refill on your cardiac medications before your next appointment, please call your pharmacy.

## 2017-10-04 NOTE — Progress Notes (Signed)
Unremarkable started been a short week     Patient Care Team: [name], DO as PCP - General (Family Medicine) [name], MD as PCP - Cardiology (Cardiology) [name], MD as Consulting Physician (Nephrology)   HPI  Morgan Clay is a 72 y.o. female Seen in followup for CRT-D implanted for nonischemic cardiomyopathy. This is undertaken 2008 with generator change 2012 and again in May 2017   She has significant improvement from her CRT-D   She has a history of atypical atrial flutter that has been associated with functional deterioration. She underwent cardioversion  8/16 and has been treated with apixaban.     Persistent atrial arrhythmias.  Underwent DCCV 3/19 on amio and has held sinus rhythm since  Patient denies symptoms of GI intolerance, sun sensitivity, neurological symptoms attributable to amiodarone.     The patient denies chest pain, shortness of breath, nocturnal dyspnea, orthopnea or peripheral edema.  There have been no palpitations, lightheadedness or syncope.   Hospitalized for nearly a month in March for diverticulitis by lobar pneumonia/acute CHF.  She was notably significantly anemic compared to January.  This remained surprisingly stable     Date Cr K Hgb LFTs TSH  1/19 1.76 4.3 14.7   0.33  4/19  1.96 4.2 9.7        DATE TEST EF   4/15 Echo 55-60 %   9/17 Echo 45-50 % LAE (52/3.1/35)  10/18 Echo  45-50% LVH mod  4/19 Echo  55-65%      Past Medical History:  Diagnosis Date  . Arthritis   . Asthma    reports mild asthma since childhood - had COPD on dx list from prior PCP  . Atypical atrial flutter ([place]) 09/28/2015  . Chronic systolic congestive heart failure ([place])   . DEPRESSION 12/17/2009   Annotation: PHQ-9 score = 14 done on 12/17/2009 Qualifier: Diagnosis of  By: Hassell Done FNP, Tori Milks    . FIBROIDS, UTERUS 03/05/2008  . Glaucoma   . HEPATITIS C - s/p treatment with Harvoni, seeing hepatology, Dawn Drazek 01/04/2007  .  Hyperlipemia 12/06/2012  . HYPERTENSION, BENIGN 04/24/2007  . Hypothyroidism   . LBBB (left bundle branch block)   . Nonischemic cardiomyopathy ([place])   . OBESITY 05/27/2009  . OBSTRUCTIVE SLEEP APNEA 11/14/2007   npsg 2009:  Mild osa with AHI 11/hr. Started cpap 2009, quit using due to lack of response Home sleep testing 09/2010 with AHI only 7/hr and weight loss advised by pulmonology.  . OSTEOPENIA 09/30/2008  . Personal history of other infectious and parasitic disease    Hepatitis B    Past Surgical History:  Procedure Laterality Date  . appendectomy    . CARDIAC CATHETERIZATION    . CARDIOVERSION N/A 12/14/2014   Procedure: CARDIOVERSION;  Surgeon: Dorothy Spark, MD;  Location: [facility] ENDOSCOPY;  Service: Cardiovascular;  Laterality: N/A;  . CARDIOVERSION N/A 02/26/2017   Procedure: CARDIOVERSION;  Surgeon: Evans Lance, MD;  Location: [place] CV LAB;  Service: Cardiovascular;  Laterality: N/A;  . CARDIOVERSION N/A 07/24/2017   Procedure: CARDIOVERSION;  Surgeon: Thayer Headings, MD;  Location: [facility] ENDOSCOPY;  Service: Cardiovascular;  Laterality: N/A;  . EP IMPLANTABLE DEVICE N/A 10/06/2015   Procedure: BIV ICD Generator Changeout;  Surgeon: [name], MD;  Location: [place] CV LAB;  Service: Cardiovascular;  Laterality: N/A;    Current Outpatient Medications  Medication Sig Dispense Refill  . albuterol (PROVENTIL HFA;VENTOLIN HFA) 108 (90 BASE) MCG/ACT inhaler Inhale  2 puffs into the lungs every 6 (six) hours as needed for wheezing or shortness of breath. 1 Inhaler 3  . amiodarone (PACERONE) 200 MG tablet Take 200 mg by mouth daily.    [name] BIDIL 20-37.5 MG tablet TAKE 1 TABLET BY MOUTH TWICE DAILY 180 tablet 3  . carvedilol (COREG) 12.5 MG tablet Take 1 tablet (12.5 mg total) by mouth 2 (two) times daily with a meal. 60 tablet 0  . ELIQUIS 5 MG TABS tablet TAKE ONE TABLET BY MOUTH TWICE DAILY 180 tablet 3  . furosemide (LASIX) 40 MG tablet Take 1 tablet (40 mg total) by  mouth 2 (two) times daily. 60 tablet 0  . Hydrocortisone (GERHARDT'S BUTT CREAM) CREA Apply 1 application topically 2 (two) times daily. 1 each 2  . levothyroxine (SYNTHROID, LEVOTHROID) 125 MCG tablet Take 125 mcg by mouth daily before breakfast.    . nitroGLYCERIN (NITROSTAT) 0.4 MG SL tablet Place 0.4 mg under the tongue every 5 (five) minutes as needed for chest pain.    . pantoprazole (PROTONIX) 20 MG tablet Take 1 tablet (20 mg total) by mouth daily. 90 tablet 0  . sertraline (ZOLOFT) 50 MG tablet Take 1 tablet (50 mg total) by mouth daily. 30 tablet 3  . spironolactone (ALDACTONE) 25 MG tablet Take 1 tablet (25 mg total) daily by mouth. 90 tablet 3  . Tetrahydrozoline HCl (VISINE OP) Place 1 drop into both eyes daily as needed (DRY EYES).    . traMADol (ULTRAM) 50 MG tablet Take 2 tablets (100 mg total) by mouth every 12 (twelve) hours as needed (mild pain. use Tylenol first). 8 tablet 0  . traZODone (DESYREL) 50 MG tablet Take 50 mg by mouth at bedtime as needed for sleep.     No current facility-administered medications for this visit.     No Known Allergies  Review of Systems negative except from HPI and PMH  Physical Exam BP (!) 130/58   Pulse 60   Ht 4\' 11"  (1.499 m)   Wt 141 lb (64 kg)   SpO2 95%   BMI 28.48 kg/m  Well developed and nourished in no acute distress HENT normal Neck supple with JVP-flat Clear Regular rate and rhythm, no murmurs or gallops Abd-soft with active BS No Clubbing cyanosis edema Skin-warm and dry A & Oriented  Grossly normal sensory and motor function .  ECG  AV BiV pacing Neg QRS lead 1 and pos QRS V1 so   Assessment and  Plan    DiastolicSystolic heart failure chronic  Nonischemic cardiomyopathy  High Risk Medication Surveillance  Hypertension    CRT-D- [employer]  The patient's device was interrogated.  The information was reviewed. No changes were made in the programming.    Diaphragmatic stimulation   Atrial  Flutter-atypical & slow  Anemia  Her Anemia has been treated with an iron infusion; now we need to continue her anticoagulation But at least my estimation the abrupt change is not likely related to renal dysfunction.  We will check iron studies although they may be off because she received an iron infusion a couple of weeks ago.      She is euvolemic.  We will continue her current medications.  She is feeling better.  Holding sinus rhythm.  More than 50% of 40 min was spent in counseling related to the above

## 2017-10-05 ENCOUNTER — Ambulatory Visit (INDEPENDENT_AMBULATORY_CARE_PROVIDER_SITE_OTHER)
Admission: RE | Admit: 2017-10-05 | Discharge: 2017-10-05 | Disposition: A | Discharge status: Home / Self Care | Payer: Medicare Other | Source: Ambulatory Visit | Attending: Family Medicine | Admitting: Family Medicine

## 2017-10-05 DIAGNOSIS — J189 Pneumonia, unspecified organism: Secondary | ICD-10-CM

## 2017-10-05 LAB — CBC WITH DIFFERENTIAL/PLATELET
BASOS ABS: 0 10*3/uL (ref 0.0–0.2)
BASOS: 0 %
EOS (ABSOLUTE): 0.2 10*3/uL (ref 0.0–0.4)
Eos: 2 %
Hematocrit: 26.1 % — ABNORMAL LOW (ref 34.0–46.6)
Hemoglobin: 8.4 g/dL — ABNORMAL LOW (ref 11.1–15.9)
Immature Grans (Abs): 0 10*3/uL (ref 0.0–0.1)
Immature Granulocytes: 1 %
Lymphocytes Absolute: 2.1 10*3/uL (ref 0.7–3.1)
Lymphs: 26 %
MCH: 28 pg (ref 26.6–33.0)
MCHC: 32.2 g/dL (ref 31.5–35.7)
MCV: 87 fL (ref 79–97)
MONOS ABS: 0.7 10*3/uL (ref 0.1–0.9)
Monocytes: 9 %
NEUTROS ABS: 5.3 10*3/uL (ref 1.4–7.0)
Neutrophils: 62 %
PLATELETS: 201 10*3/uL (ref 150–450)
RBC: 3 x10E6/uL — ABNORMAL LOW (ref 3.77–5.28)
RDW: 17.3 % — AB (ref 12.3–15.4)
WBC: 8.2 10*3/uL (ref 3.4–10.8)

## 2017-10-05 LAB — IRON AND TIBC
Iron Saturation: 23 % (ref 15–55)
Iron: 71 ug/dL (ref 27–139)
Total Iron Binding Capacity: 307 ug/dL (ref 250–450)
UIBC: 236 ug/dL (ref 118–369)

## 2017-10-05 LAB — FERRITIN: Ferritin: 735 ng/mL — ABNORMAL HIGH (ref 15–150)

## 2017-10-09 ENCOUNTER — Encounter: Payer: Self-pay | Admitting: Family Medicine

## 2017-10-09 ENCOUNTER — Ambulatory Visit (INDEPENDENT_AMBULATORY_CARE_PROVIDER_SITE_OTHER): Payer: Medicare Other | Admitting: Family Medicine

## 2017-10-09 VITALS — BP 120/58 | HR 60 | Temp 97.8°F | Ht 59.0 in | Wt 141.8 lb

## 2017-10-09 DIAGNOSIS — M9902 Segmental and somatic dysfunction of thoracic region: Secondary | ICD-10-CM

## 2017-10-09 DIAGNOSIS — M9901 Segmental and somatic dysfunction of cervical region: Secondary | ICD-10-CM | POA: Diagnosis not present

## 2017-10-09 DIAGNOSIS — M542 Cervicalgia: Secondary | ICD-10-CM | POA: Diagnosis not present

## 2017-10-09 DIAGNOSIS — M79601 Pain in right arm: Secondary | ICD-10-CM | POA: Diagnosis not present

## 2017-10-09 DIAGNOSIS — M9907 Segmental and somatic dysfunction of upper extremity: Secondary | ICD-10-CM | POA: Diagnosis not present

## 2017-10-09 MED ORDER — TIZANIDINE HCL 2 MG PO TABS: 2.0000 mg | ORAL_TABLET | Freq: Every evening | ORAL | 0 refills | Status: DC | PRN

## 2017-10-09 NOTE — Progress Notes (Signed)
HPI:  Using dictation device. Unfortunately this device frequently misinterprets words/phrases.  Acute visit for several musculoskeletal complaints: -R forearm pain - sharp with certain movements with the arm x 2-3 weeks, cant remember injuring -R post neck pain to trapezius muscle, dull, more constant -finish PT a week or 2 ago for hospital deconditioning -denies weakness, numbness, fevers, malaise  ROS: See pertinent positives and negatives per HPI.  Past Medical History:  Diagnosis Date  . Arthritis   . Asthma    reports mild asthma since childhood - had COPD on dx list from prior PCP  . Atypical atrial flutter ([place]) 09/28/2015  . Chronic systolic congestive heart failure ([place])   . DEPRESSION 12/17/2009   Annotation: PHQ-9 score = 14 done on 12/17/2009 Qualifier: Diagnosis of  By: Hassell Done FNP, Tori Milks    . FIBROIDS, UTERUS 03/05/2008  . Glaucoma   . HEPATITIS C - s/p treatment with Harvoni, seeing hepatology, Dawn Drazek 01/04/2007  . Hyperlipemia 12/06/2012  . HYPERTENSION, BENIGN 04/24/2007  . Hypothyroidism   . LBBB (left bundle branch block)   . Nonischemic cardiomyopathy ([place])   . OBESITY 05/27/2009  . OBSTRUCTIVE SLEEP APNEA 11/14/2007   npsg 2009:  Mild osa with AHI 11/hr. Started cpap 2009, quit using due to lack of response Home sleep testing 09/2010 with AHI only 7/hr and weight loss advised by pulmonology.  . OSTEOPENIA 09/30/2008  . Personal history of other infectious and parasitic disease    Hepatitis B    Past Surgical History:  Procedure Laterality Date  . appendectomy    . CARDIAC CATHETERIZATION    . CARDIOVERSION N/A 12/14/2014   Procedure: CARDIOVERSION;  Surgeon: Dorothy Spark, MD;  Location: [facility] ENDOSCOPY;  Service: Cardiovascular;  Laterality: N/A;  . CARDIOVERSION N/A 02/26/2017   Procedure: CARDIOVERSION;  Surgeon: Evans Lance, MD;  Location: [place] CV LAB;  Service: Cardiovascular;  Laterality: N/A;  . CARDIOVERSION N/A 07/24/2017   Procedure: CARDIOVERSION;  Surgeon: Thayer Headings, MD;  Location[facility] ENDOSCOPY;  Service: Cardiovascular;  Laterality: N/A;  . EP IMPLANTABLE DEVICE N/A 10/06/2015   Procedure: BIV ICD Generator Changeout;  Surgeon: [name], MD;  Location: [place] CV LAB;  Service: Cardiovascular;  Laterality: N/A;    Family History  Problem Relation Age of Onset  . Asthma Father   . Heart attack Father   . Asthma Sister   . Cancer Sister        lung  . Heart attack Mother   . Stroke Brother   . Diabetes Unknown     SOCIAL HX: see hpi   Current Outpatient Medications:  .  albuterol (PROVENTIL HFA;VENTOLIN HFA) 108 (90 BASE) MCG/ACT inhaler, Inhale 2 puffs into the lungs every 6 (six) hours as needed for wheezing or shortness of breath., Disp: 1 Inhaler, Rfl: 3 .  amiodarone (PACERONE) 200 MG tablet, Take 200 mg by mouth daily., Disp: , Rfl:  .  BIDIL 20-37.5 MG tablet, TAKE 1 TABLET BY MOUTH TWICE DAILY, Disp: 180 tablet, Rfl: 3 .  carvedilol (COREG) 12.5 MG tablet, Take 1 tablet (12.5 mg total) by mouth 2 (two) times daily with a meal., Disp: 60 tablet, Rfl: 0 .  ELIQUIS 5 MG TABS tablet, TAKE ONE TABLET BY MOUTH TWICE DAILY, Disp: 180 tablet, Rfl: 3 .  furosemide (LASIX) 40 MG tablet, Take 1 tablet (40 mg total) by mouth 2 (two) times daily., Disp: 60 tablet, Rfl: 0 .  Hydrocortisone (GERHARDT'S BUTT CREAM) CREA, Apply 1 application  topically 2 (two) times daily., Disp: 1 each, Rfl: 2 .  levothyroxine (SYNTHROID, LEVOTHROID) 125 MCG tablet, Take 125 mcg by mouth daily before breakfast., Disp: , Rfl:  .  nitroGLYCERIN (NITROSTAT) 0.4 MG SL tablet, Place 0.4 mg under the tongue every 5 (five) minutes as needed for chest pain., Disp: , Rfl:  .  pantoprazole (PROTONIX) 20 MG tablet, Take 1 tablet (20 mg total) by mouth daily., Disp: 90 tablet, Rfl: 0 .  sertraline (ZOLOFT) 50 MG tablet, Take 1 tablet (50 mg total) by mouth daily., Disp: 30 tablet, Rfl: 3 .  spironolactone (ALDACTONE) 25 MG  tablet, Take 1 tablet (25 mg total) daily by mouth., Disp: 90 tablet, Rfl: 3 .  Tetrahydrozoline HCl (VISINE OP), Place 1 drop into both eyes daily as needed (DRY EYES)., Disp: , Rfl:  .  traMADol (ULTRAM) 50 MG tablet, Take 2 tablets (100 mg total) by mouth every 12 (twelve) hours as needed (mild pain. use Tylenol first)., Disp: 8 tablet, Rfl: 0 .  traZODone (DESYREL) 50 MG tablet, Take 50 mg by mouth at bedtime as needed for sleep., Disp: , Rfl:  .  tiZANidine (ZANAFLEX) 2 MG tablet, Take 1 tablet (2 mg total) by mouth at bedtime as needed for muscle spasms., Disp: 20 tablet, Rfl: 0  EXAM:  Vitals:   10/09/17 0933  BP: (!) 120/58  Pulse: 60  Temp: 97.8 F (36.6 C)  SpO2: 98%    Body mass index is 28.64 kg/m.  GENERAL: vitals reviewed and listed above, alert, oriented, appears well hydrated and in no acute distress  HEENT: atraumatic, conjunttiva clear, no obvious abnormalities on inspection of external nose and ears  NECK: no obvious masses on inspection  LUNGS: clear to auscultation bilaterally, no wheezes, rales or rhonchi, good air movement  CV: HRRR, no peripheral edema  MS: moves all extremities without noticeable abnormality, TTP R forearm extensors  - increased with activation of forearm exts, strength/DTRs/sensitivity to touch intact throughout in UEs bilat with coaching, head and neck ROM intact, neg spurling, neg vert art insuff testing, C3 Post TP with C3 F SlRl, suboccipital muscle tension, mid fibers trapezius with TTP and spasm  PSYCH: pleasant and cooperative, no obvious depression or anxiety  ASSESSMENT AND PLAN:  Discussed the following assessment and plan:  Neck pain - Plan: Ambulatory referral to Physical Therapy  Pain of right upper extremity - Plan: Ambulatory referral to Physical Therapy  Cervical (neck) region somatic dysfunction  Somatic dysfunction of spine, thoracic  Somatic dysfunction of upper extremity  -we discussed possible serious and  likely etiologies, workup and treatment, treatment risks and return precautions - suspect R forearm ext tendinopathy and strain vs/and/or cervical radicular symptoms, somatic dysfunction -after this discussion, Margeret opted for PT, muscle relaxer at night, symptomatic OTC pain relief with avoidance Nsaids, consider OMT -follow up advised 1-2 weeks -of course, we advised Krisalyn  to return or notify a doctor immediately if symptoms worsen or persist or new concerns arise.    Patient Instructions  BEFORE YOU LEAVE: -follow up: 1 week (30 minutes)  Heat for neck twice daily for 15 minutes  Ice for arm twice daily  Topical menthol (tiger balm) as needed for pain  Muscle relaxer as needed for at night before bed  Tylenol 500mg  up to 3 times daily if needed for pain.   I hope you are feeling better soon! Seek care promptly if your symptoms worsen, new concerns arise or you are not improving with treatment.  Annalyssa Thune R Matrice Herro, DO   

## 2017-10-09 NOTE — Patient Instructions (Addendum)
BEFORE YOU LEAVE: -follow up: 1 week (30 minutes)  Heat for neck twice daily for 15 minutes  Ice for arm twice daily  Topical menthol (tiger balm) as needed for pain  Muscle relaxer as needed for at night before bed  Tylenol 500mg  up to 3 times daily if needed for pain.   I hope you are feeling better soon! Seek care promptly if your symptoms worsen, new concerns arise or you are not improving with treatment.

## 2017-10-10 ENCOUNTER — Ambulatory Visit: Payer: Medicare Other | Attending: Family Medicine | Admitting: Physical Therapy

## 2017-10-10 DIAGNOSIS — M25511 Pain in right shoulder: Secondary | ICD-10-CM | POA: Diagnosis not present

## 2017-10-10 DIAGNOSIS — M25611 Stiffness of right shoulder, not elsewhere classified: Secondary | ICD-10-CM

## 2017-10-10 DIAGNOSIS — M542 Cervicalgia: Secondary | ICD-10-CM | POA: Insufficient documentation

## 2017-10-10 DIAGNOSIS — M6281 Muscle weakness (generalized): Secondary | ICD-10-CM

## 2017-10-10 NOTE — Therapy (Signed)
[facility] [address], [place], [place], [address] Phone: [phone]   Fax:  [phone]  Physical Therapy Evaluation  Patient Details  Name: Morgan Clay MRN: [id number] Date of Birth: 1945/09/26 Referring Provider: Colin Benton, MD   Encounter Date: 10/10/2017  PT End of Session - 10/10/17 1303    Visit Number  1    Number of Visits  12    Date for PT Re-Evaluation  12/10/17    Authorization Type  Due to $40 copay, pt can only come 1x/week and limit her frequency.     PT Start Time  1147    PT Stop Time  1245    PT Time Calculation (min)  58 min    Activity Tolerance  Patient tolerated treatment well    Behavior During Therapy  WFL for tasks assessed/performed       Past Medical History:  Diagnosis Date  . Arthritis   . Asthma    reports mild asthma since childhood - had COPD on dx list from prior PCP  . Atypical atrial flutter ([place]) 09/28/2015  . Chronic systolic congestive heart failure ([place])   . DEPRESSION 12/17/2009   Annotation: PHQ-9 score = 14 done on 12/17/2009 Qualifier: Diagnosis of  By: Hassell Done FNP, Tori Milks    . FIBROIDS, UTERUS 03/05/2008  . Glaucoma   . HEPATITIS C - s/p treatment with Harvoni, seeing hepatology, Dawn Drazek 01/04/2007  . Hyperlipemia 12/06/2012  . HYPERTENSION, BENIGN 04/24/2007  . Hypothyroidism   . LBBB (left bundle branch block)   . Nonischemic cardiomyopathy ([place])   . OBESITY 05/27/2009  . OBSTRUCTIVE SLEEP APNEA 11/14/2007   npsg 2009:  Mild osa with AHI 11/hr. Started cpap 2009, quit using due to lack of response Home sleep testing 09/2010 with AHI only 7/hr and weight loss advised by pulmonology.  . OSTEOPENIA 09/30/2008  . Personal history of other infectious and parasitic disease    Hepatitis B    Past Surgical History:  Procedure Laterality Date  . appendectomy    . CARDIAC CATHETERIZATION    . CARDIOVERSION N/A 12/14/2014   Procedure: CARDIOVERSION;  Surgeon:  Dorothy Spark, MD;  Location: [facility] ENDOSCOPY;  Service: Cardiovascular;  Laterality: N/A;  . CARDIOVERSION N/A 02/26/2017   Procedure: CARDIOVERSION;  Surgeon: Evans Lance, MD;  Location: [place] CV LAB;  Service: Cardiovascular;  Laterality: N/A;  . CARDIOVERSION N/A 07/24/2017   Procedure: CARDIOVERSION;  Surgeon: Thayer Headings, MD;  Location: [facility] ENDOSCOPY;  Service: Cardiovascular;  Laterality: N/A;  . EP IMPLANTABLE DEVICE N/A 10/06/2015   Procedure: BIV ICD Generator Changeout;  Surgeon: [name], MD;  Location: [place] CV LAB;  Service: Cardiovascular;  Laterality: N/A;    There were no vitals filed for this visit.       [facility] PT Assessment - 10/10/17 0001      Assessment   Medical Diagnosis  neck pain, R shoulder pain    Referring Provider  Colin Benton, MD    Onset Date/Surgical Date  -- 3 weeks    Hand Dominance  Right    Prior Therapy  yes      Precautions   Precautions  None      Balance Screen   Has the patient fallen in the past 6 months  No    Is the patient reluctant to leave their home because of a fear of falling?   No      Home Environment  Living Environment  Private residence    [place]      Prior Function   Level of [place]  Retired    Leisure  Technical brewer, likes to go shopping      Cognition   Overall Cognitive Status  Within Functional Limits for tasks assessed      Observation/Other Assessments   Focus on Therapeutic Outcomes (FOTO)   58% limitation      ROM / Strength   AROM / PROM / Strength  AROM;Strength      AROM   Overall AROM Comments  R shoulder ER: 50 degrees    AROM Assessment Site  Cervical    Cervical Flexion  20    Cervical Extension  15    Cervical - Right Side Bend  28    Cervical - Left Side Bend  30    Cervical - Right Rotation  35    Cervical - Left Rotation  40      Strength   Strength Assessment Site  Shoulder    Right/Left  Shoulder  Right    Right Shoulder Flexion  3/5    Right Shoulder Extension  3/5    Right Shoulder ABduction  3-/5    Right Shoulder Internal Rotation  3-/5    Right Shoulder External Rotation  3-/5      Palpation   Palpation comment  Pt with active trigger points in R Upper Trap and along medial scapular border. Pt also with tenderness noted at latissimus insertion       Ambulation/Gait   Gait Comments  decreased R arm swing with amb                Objective measurements completed on examination: See above findings.      [facility] Adult PT Treatment/Exercise - 10/10/17 0001      Exercises   Exercises  Neck;Shoulder      Shoulder Exercises: Supine   External Rotation  AAROM;Right;10 reps    Flexion  AAROM;Both;10 reps    Other Supine Exercises  open book stretch with towel roll holding 30 seocnds x 3             PT Education - 10/10/17 1231    Education Details  HEP and posture correction    Person(s) Educated  Patient    Methods  Explanation;Handout    Comprehension  Verbalized understanding       PT Short Term Goals - 10/10/17 1319      PT SHORT TERM GOAL #1   Title  pt will be independent in her HEP    Time  3    Period  Weeks    Status  New    Target Date  10/31/17        PT Long Term Goals - 10/10/17 1319      PT LONG TERM GOAL #1   Title  Pt will be able to improve her FOTO score from 58% limitiation to </= 38% limitation.     Time  6    Period  Weeks    Status  New    Target Date  11/21/17      PT LONG TERM GOAL #2   Title  Pt will be able to improve her bilateral cervical rotation to >/=60 degrees with pain </= 2/10.     Time  6    Period  Weeks    Status  New    Target Date  11/21/17      PT LONG TERM GOAL #3   Title  Pt will improve her R shoulder ER to >/= 70 degrees in order to improve funcitonal moiblity.     Time  6    Period  Weeks    Status  New    Target Date  11/21/17      PT LONG TERM GOAL #4   Title  Pt will be  able to report sleeping through the night without waking up from pain.     Time  6    Period  Weeks    Status  New    Target Date  11/21/17             Plan - 10/10/17 1305    Clinical Impression Statement  Pt presenting with acute right shoulder and neck pain with active trigger points in R Upper Trap and along her medial scapular border. Pt reporting this pain began about 3 weeks ago. Pt also was recently hospitalized from March to April 2019 for 1 month. Pt reporting extreme deconditioning during that time. Pt was dx with a colon infection and pneumonia.  Pt with limited R shoulder ER and limited cervical AROM. Pt also presenting with forward shoulders which is more pronounced on the right. Pt was issued a HEP for ROM and instructed in self massage using a tennis ball. Moist Heat applied following STW to R shoulder and pt reporting decreased pain at end of session.     History and Personal Factors relevant to plan of care:  pacemaker/defib, HTN, A-fib, Hep C, cardiomyopathy    Clinical Presentation  Evolving    Clinical Presentation due to:  Pt radiating down R UE    Rehab Potential  Good    Clinical Impairments Affecting Rehab Potential  PMH, pt reported she will have to limit her frequency due to high co-pay.     PT Frequency  2x / week    PT Duration  6 weeks    PT Treatment/Interventions  ADLs/Self Care Home Management;Cryotherapy;Moist Heat;Traction;Ultrasound;Functional mobility training;Therapeutic activities;Therapeutic exercise;Manual techniques;Patient/family education;Neuromuscular re-education;Passive range of motion;Taping    PT Next Visit Plan  cervical stretching, Shoulder ROM gentle exercises, modalities as needed    PT Home Exercise Plan  shoulder flexion with cane, Shoulder ER with cane, open book stretch with towel roll, self soft tissue mobilization using tennis ball, posture correction    Consulted and Agree with Plan of Care  Patient       Patient will benefit  from skilled therapeutic intervention in order to improve the following deficits and impairments:  Postural dysfunction, Decreased range of motion, Pain, Impaired UE functional use, Decreased strength, Decreased activity tolerance, Decreased mobility  Visit Diagnosis: Acute pain of right shoulder  Cervicalgia  Muscle weakness (generalized)  Stiffness of right shoulder, not elsewhere classified     Problem List Patient Active Problem List   Diagnosis Date Noted  . Aortic atherosclerosis ([place]) 08/30/2017  . Pulmonary edema, acute ([place]) 08/09/2017  . Pain and swelling of left upper extremity 08/08/2017  . ARF (acute renal failure) ([place])   . Enteritis   . Partial small bowel obstruction ([place])   . Diverticulitis 07/31/2017  . Persistent atrial fibrillation ([place])   . Hypertension associated with diabetes ([place]) 06/07/2017  . MDD (major depressive disorder), recurrent, in partial remission ([place]) 06/07/2017  . Asthma 05/22/2017  . Chronic obstructive pulmonary disease (  [place])   . Wheezes   . CHF (congestive heart failure) ([place]) 02/23/2017  . Atypical atrial flutter ([place]) 09/28/2015  . Type 2 diabetes, controlled, with renal manifestation ([place]) 11/24/2013  . Chronic kidney disease - followed by [facility] 11/24/2013  . Nonischemic cardiomyopathy ([place]) 11/22/2010  . Obesity 05/27/2009  . OSTEOPENIA 09/30/2008  . OBSTRUCTIVE SLEEP APNEA 11/14/2007  . Chronic systolic heart failure ([place]) 04/24/2007  . Hypothyroidism 01/28/2007  . Biventricular ICD (implantable cardioverter-defibrillator) in place 01/08/2007    Oretha Caprice, MPT 10/10/2017, 1:27 PM  [facility] [address] [place], [place], [address] Phone: [phone]   Fax:  [phone]  Name: Morgan Clay MRN: [id number] Date of Birth: 1945-06-04

## 2017-10-17 ENCOUNTER — Encounter: Payer: Medicare Other | Admitting: Physical Therapy

## 2017-10-23 ENCOUNTER — Other Ambulatory Visit: Payer: Self-pay | Admitting: *Deleted

## 2017-10-23 DIAGNOSIS — I129 Hypertensive chronic kidney disease with stage 1 through stage 4 chronic kidney disease, or unspecified chronic kidney disease: Secondary | ICD-10-CM | POA: Diagnosis not present

## 2017-10-23 DIAGNOSIS — I5043 Acute on chronic combined systolic (congestive) and diastolic (congestive) heart failure: Secondary | ICD-10-CM | POA: Diagnosis not present

## 2017-10-23 DIAGNOSIS — D649 Anemia, unspecified: Secondary | ICD-10-CM

## 2017-10-23 DIAGNOSIS — R809 Proteinuria, unspecified: Secondary | ICD-10-CM | POA: Diagnosis not present

## 2017-10-23 DIAGNOSIS — N183 Chronic kidney disease, stage 3 (moderate): Secondary | ICD-10-CM | POA: Diagnosis not present

## 2017-10-24 ENCOUNTER — Telehealth: Payer: Self-pay

## 2017-10-24 NOTE — Telephone Encounter (Signed)
Per [facility] @ Dr. Sanda Klein office pt was seen there yesterday. They did labs. Pt's Hgb is 7.0, they have set her up for transfusion of 1L at [facility]. [name] feels pt may have a GI bleed and is recommending pt have STAT GI referral.  Dr. [name] - Please advise if ok to place referral. Thanks!

## 2017-10-24 NOTE — Telephone Encounter (Signed)
IF feels dropped that quickly from GI bleed needs to go to ER. Did [name] refer to GI? If not  - ok to place stat referral. Thanks.

## 2017-10-25 ENCOUNTER — Other Ambulatory Visit: Payer: Self-pay

## 2017-10-25 ENCOUNTER — Encounter (HOSPITAL_COMMUNITY): Payer: Self-pay | Admitting: Emergency Medicine

## 2017-10-25 ENCOUNTER — Ambulatory Visit: Payer: Medicare Other | Admitting: Family Medicine

## 2017-10-25 ENCOUNTER — Encounter (HOSPITAL_COMMUNITY): Payer: Medicare Other

## 2017-10-25 ENCOUNTER — Inpatient Hospital Stay (HOSPITAL_COMMUNITY)
Admission: EM | Admit: 2017-10-25 | Discharge: 2017-10-26 | DRG: 378 | Disposition: A | Discharge status: Home / Self Care | Payer: Medicare Other | Attending: Internal Medicine | Admitting: Internal Medicine

## 2017-10-25 DIAGNOSIS — I5032 Chronic diastolic (congestive) heart failure: Secondary | ICD-10-CM | POA: Diagnosis not present

## 2017-10-25 DIAGNOSIS — Z7901 Long term (current) use of anticoagulants: Secondary | ICD-10-CM | POA: Diagnosis not present

## 2017-10-25 DIAGNOSIS — N183 Chronic kidney disease, stage 3 (moderate): Secondary | ICD-10-CM | POA: Diagnosis not present

## 2017-10-25 DIAGNOSIS — N179 Acute kidney failure, unspecified: Secondary | ICD-10-CM | POA: Diagnosis present

## 2017-10-25 DIAGNOSIS — K922 Gastrointestinal hemorrhage, unspecified: Secondary | ICD-10-CM | POA: Diagnosis present

## 2017-10-25 DIAGNOSIS — I481 Persistent atrial fibrillation: Secondary | ICD-10-CM | POA: Diagnosis not present

## 2017-10-25 DIAGNOSIS — I482 Chronic atrial fibrillation, unspecified: Secondary | ICD-10-CM

## 2017-10-25 DIAGNOSIS — R195 Other fecal abnormalities: Secondary | ICD-10-CM | POA: Diagnosis not present

## 2017-10-25 DIAGNOSIS — I2510 Atherosclerotic heart disease of native coronary artery without angina pectoris: Secondary | ICD-10-CM | POA: Diagnosis not present

## 2017-10-25 DIAGNOSIS — K3189 Other diseases of stomach and duodenum: Secondary | ICD-10-CM | POA: Diagnosis not present

## 2017-10-25 DIAGNOSIS — K5790 Diverticulosis of intestine, part unspecified, without perforation or abscess without bleeding: Secondary | ICD-10-CM | POA: Diagnosis present

## 2017-10-25 DIAGNOSIS — Y92009 Unspecified place in unspecified non-institutional (private) residence as the place of occurrence of the external cause: Secondary | ICD-10-CM

## 2017-10-25 DIAGNOSIS — Z6829 Body mass index (BMI) 29.0-29.9, adult: Secondary | ICD-10-CM

## 2017-10-25 DIAGNOSIS — D6832 Hemorrhagic disorder due to extrinsic circulating anticoagulants: Secondary | ICD-10-CM | POA: Diagnosis present

## 2017-10-25 DIAGNOSIS — E669 Obesity, unspecified: Secondary | ICD-10-CM | POA: Diagnosis present

## 2017-10-25 DIAGNOSIS — Z8719 Personal history of other diseases of the digestive system: Secondary | ICD-10-CM | POA: Diagnosis not present

## 2017-10-25 DIAGNOSIS — N189 Chronic kidney disease, unspecified: Secondary | ICD-10-CM

## 2017-10-25 DIAGNOSIS — G4733 Obstructive sleep apnea (adult) (pediatric): Secondary | ICD-10-CM | POA: Diagnosis present

## 2017-10-25 DIAGNOSIS — Z801 Family history of malignant neoplasm of trachea, bronchus and lung: Secondary | ICD-10-CM

## 2017-10-25 DIAGNOSIS — Z79890 Hormone replacement therapy (postmenopausal): Secondary | ICD-10-CM

## 2017-10-25 DIAGNOSIS — D649 Anemia, unspecified: Secondary | ICD-10-CM | POA: Diagnosis not present

## 2017-10-25 DIAGNOSIS — F329 Major depressive disorder, single episode, unspecified: Secondary | ICD-10-CM | POA: Diagnosis present

## 2017-10-25 DIAGNOSIS — K222 Esophageal obstruction: Secondary | ICD-10-CM | POA: Diagnosis not present

## 2017-10-25 DIAGNOSIS — I129 Hypertensive chronic kidney disease with stage 1 through stage 4 chronic kidney disease, or unspecified chronic kidney disease: Secondary | ICD-10-CM | POA: Diagnosis not present

## 2017-10-25 DIAGNOSIS — E1122 Type 2 diabetes mellitus with diabetic chronic kidney disease: Secondary | ICD-10-CM | POA: Diagnosis not present

## 2017-10-25 DIAGNOSIS — I130 Hypertensive heart and chronic kidney disease with heart failure and stage 1 through stage 4 chronic kidney disease, or unspecified chronic kidney disease: Secondary | ICD-10-CM | POA: Diagnosis present

## 2017-10-25 DIAGNOSIS — Z825 Family history of asthma and other chronic lower respiratory diseases: Secondary | ICD-10-CM | POA: Diagnosis not present

## 2017-10-25 DIAGNOSIS — J449 Chronic obstructive pulmonary disease, unspecified: Secondary | ICD-10-CM | POA: Diagnosis not present

## 2017-10-25 DIAGNOSIS — K31811 Angiodysplasia of stomach and duodenum with bleeding: Principal | ICD-10-CM | POA: Diagnosis present

## 2017-10-25 DIAGNOSIS — T45515A Adverse effect of anticoagulants, initial encounter: Secondary | ICD-10-CM | POA: Diagnosis present

## 2017-10-25 DIAGNOSIS — E039 Hypothyroidism, unspecified: Secondary | ICD-10-CM | POA: Diagnosis present

## 2017-10-25 DIAGNOSIS — I428 Other cardiomyopathies: Secondary | ICD-10-CM

## 2017-10-25 DIAGNOSIS — D62 Acute posthemorrhagic anemia: Secondary | ICD-10-CM | POA: Diagnosis present

## 2017-10-25 DIAGNOSIS — I484 Atypical atrial flutter: Secondary | ICD-10-CM | POA: Diagnosis not present

## 2017-10-25 DIAGNOSIS — Z8619 Personal history of other infectious and parasitic diseases: Secondary | ICD-10-CM

## 2017-10-25 DIAGNOSIS — Z8249 Family history of ischemic heart disease and other diseases of the circulatory system: Secondary | ICD-10-CM

## 2017-10-25 HISTORY — DX: Chronic atrial fibrillation, unspecified: I48.20

## 2017-10-25 LAB — COMPREHENSIVE METABOLIC PANEL
ALK PHOS: 53 U/L (ref 38–126)
ALT: 13 U/L — ABNORMAL LOW (ref 14–54)
ANION GAP: 9 (ref 5–15)
AST: 22 U/L (ref 15–41)
Albumin: 4.5 g/dL (ref 3.5–5.0)
BUN: 77 mg/dL — ABNORMAL HIGH (ref 6–20)
CALCIUM: 9.3 mg/dL (ref 8.9–10.3)
CHLORIDE: 108 mmol/L (ref 101–111)
CO2: 22 mmol/L (ref 22–32)
Creatinine, Ser: 2.7 mg/dL — ABNORMAL HIGH (ref 0.44–1.00)
GFR calc non Af Amer: 17 mL/min — ABNORMAL LOW (ref >60)
GFR, EST AFRICAN AMERICAN: 20 mL/min — AB (ref >60)
Glucose, Bld: 156 mg/dL — ABNORMAL HIGH (ref 65–99)
POTASSIUM: 4.7 mmol/L (ref 3.5–5.1)
SODIUM: 139 mmol/L (ref 135–145)
Total Bilirubin: 0.2 mg/dL — ABNORMAL LOW (ref 0.3–1.2)
Total Protein: 8.6 g/dL — ABNORMAL HIGH (ref 6.5–8.1)

## 2017-10-25 LAB — CBC
HEMATOCRIT: 22.9 % — AB (ref 36.0–46.0)
HEMOGLOBIN: 7.1 g/dL — AB (ref 12.0–15.0)
MCH: 29 pg (ref 26.0–34.0)
MCHC: 31 g/dL (ref 30.0–36.0)
MCV: 93.5 fL (ref 78.0–100.0)
Platelets: 246 10*3/uL (ref 150–400)
RBC: 2.45 MIL/uL — AB (ref 3.87–5.11)
RDW: 16.4 % — ABNORMAL HIGH (ref 11.5–15.5)
WBC: 8.1 10*3/uL (ref 4.0–10.5)

## 2017-10-25 LAB — POC OCCULT BLOOD, ED: FECAL OCCULT BLD: POSITIVE — AB

## 2017-10-25 LAB — PREPARE RBC (CROSSMATCH)

## 2017-10-25 LAB — ABO/RH: ABO/RH(D): O POS

## 2017-10-25 LAB — PROTIME-INR
INR: 1.34
Prothrombin Time: 16.5 seconds — ABNORMAL HIGH (ref 11.4–15.2)

## 2017-10-25 MED ORDER — SODIUM CHLORIDE 0.9% IV SOLUTION: Freq: Once | INTRAVENOUS | Status: DC

## 2017-10-25 MED ORDER — SODIUM CHLORIDE 0.9 % IV BOLUS
1000.0000 mL | Freq: Once | INTRAVENOUS | Status: AC
  Administered 2017-10-25: 1000 mL via INTRAVENOUS

## 2017-10-25 MED ORDER — HYDRALAZINE HCL 20 MG/ML IJ SOLN
5.0000 mg | INTRAMUSCULAR | Status: DC | PRN
  Administered 2017-10-25 – 2017-10-26 (×2): 5 mg via INTRAVENOUS
  Filled 2017-10-25 (×2): qty 1

## 2017-10-25 MED ORDER — ONDANSETRON HCL 4 MG PO TABS: 4.0000 mg | ORAL_TABLET | Freq: Four times a day (QID) | ORAL | Status: DC | PRN

## 2017-10-25 MED ORDER — ONDANSETRON HCL 4 MG/2ML IJ SOLN: 4.0000 mg | Freq: Four times a day (QID) | INTRAMUSCULAR | Status: DC | PRN

## 2017-10-25 NOTE — ED Notes (Signed)
ED TO INPATIENT HANDOFF REPORT  Name/Age/Gender Morgan Clay 72 y.o. female  Code Status Code Status History    Date Active Date Inactive Code Status Order ID Comments User Context   07/31/2017 2051 08/17/2017 1720 Full Code [id number]  Bennie Pierini, MD Inpatient   02/23/2017 0040 03/01/2017 1553 Full Code [id number]  Rise Patience, MD ED   10/02/2015 0404 10/03/2015 1943 Full Code [id number]  Norval Morton, MD ED      Home/SNF/Other Home  Chief Complaint abnormal labs   Level of Care/Admitting Diagnosis ED Disposition    ED Disposition Condition [facility] [[id number]]  Level of Care: Telemetry [5]  Admit to tele based on following criteria: Complex arrhythmia (Bradycardia/Tachycardia)  Diagnosis: GI bleed [193790]  Admitting Physician: Dessa Phi [[id number]]  Attending Physician: Dessa Phi [phone]  Estimated length of stay: past midnight tomorrow  Certification:: I certify this patient will need inpatient services for at least 2 midnights  PT Class (Do Not Modify): Inpatient [101]  PT Acc Code (Do Not Modify): Private [1]       Medical History Past Medical History:  Diagnosis Date  . Arthritis   . Asthma    reports mild asthma since childhood - had COPD on dx list from prior PCP  . Atypical atrial flutter ([place]) 09/28/2015  . Chronic systolic congestive heart failure ([place])   . DEPRESSION 12/17/2009   Annotation: PHQ-9 score = 14 done on 12/17/2009 Qualifier: Diagnosis of  By: Hassell Done FNP, Tori Milks    . FIBROIDS, UTERUS 03/05/2008  . Glaucoma   . HEPATITIS C - s/p treatment with Harvoni, seeing hepatology, Dawn Drazek 01/04/2007  . Hyperlipemia 12/06/2012  . HYPERTENSION, BENIGN 04/24/2007  . Hypothyroidism   . LBBB (left bundle branch block)   . Nonischemic cardiomyopathy ([place])   . OBESITY 05/27/2009  . OBSTRUCTIVE SLEEP APNEA 11/14/2007   npsg 2009:  Mild osa with AHI 11/hr. Started cpap 2009,  quit using due to lack of response Home sleep testing 09/2010 with AHI only 7/hr and weight loss advised by pulmonology.  . OSTEOPENIA 09/30/2008  . Personal history of other infectious and parasitic disease    Hepatitis B    Allergies No Known Allergies  IV Location/Drains/Wounds Patient Lines/Drains/Airways Status   Active Line/Drains/Airways    None          Labs/Imaging Results for orders placed or performed during the hospital encounter of 10/25/17 (from the past 48 hour(s))  Comprehensive metabolic panel     Status: Abnormal   Collection Time: 10/25/17 10:42 AM  Result Value Ref Range   Sodium 139 135 - 145 mmol/L   Potassium 4.7 3.5 - 5.1 mmol/L   Chloride 108 101 - 111 mmol/L   CO2 22 22 - 32 mmol/L   Glucose, Bld 156 (H) 65 - 99 mg/dL   BUN 77 (H) 6 - 20 mg/dL   Creatinine, Ser 2.70 (H) 0.44 - 1.00 mg/dL   Calcium 9.3 8.9 - 10.3 mg/dL   Total Protein 8.6 (H) 6.5 - 8.1 g/dL   Albumin 4.5 3.5 - 5.0 g/dL   AST 22 15 - 41 U/L   ALT 13 (L) 14 - 54 U/L   Alkaline Phosphatase 53 38 - 126 U/L   Total Bilirubin 0.2 (L) 0.3 - 1.2 mg/dL   GFR calc non Af Amer 17 (L) >60 mL/min   GFR calc Af Amer 20 (L) >60 mL/min    Comment: (NOTE)  The eGFR has been calculated using the CKD EPI equation. This calculation has not been validated in all clinical situations. eGFR's persistently <60 mL/min signify possible Chronic Kidney Disease.    Anion gap 9 5 - 15    Comment: Performed at [facility], [place] [address] [place], [place] [address]  CBC     Status: Abnormal   Collection Time: 10/25/17 10:42 AM  Result Value Ref Range   WBC 8.1 4.0 - 10.5 K/uL   RBC 2.45 (L) 3.87 - 5.11 MIL/uL   Hemoglobin 7.1 (L) 12.0 - 15.0 g/dL   HCT 22.9 (L) 36.0 - 46.0 %   MCV 93.5 78.0 - 100.0 fL   MCH 29.0 26.0 - 34.0 pg   MCHC 31.0 30.0 - 36.0 g/dL   RDW 16.4 (H) 11.5 - 15.5 %   Platelets 246 150 - 400 K/uL    Comment: Performed at [facility], [place]  [address] [place], [place] [address]  ABO/Rh     Status: None   Collection Time: 10/25/17 10:42 AM  Result Value Ref Range   ABO/RH(D)      O POS Performed at [facility], [place] [address], [place], [place] [address]   Type and screen [place]     Status: None   Collection Time: 10/25/17 10:43 AM  Result Value Ref Range   ABO/RH(D) O POS    Antibody Screen NEG    Sample Expiration      10/28/2017 Performed at [facility], [place] [address] [place], [place] [address]   POC occult blood, ED     Status: Abnormal   Collection Time: 10/25/17 10:52 AM  Result Value Ref Range   Fecal Occult Bld POSITIVE (A) NEGATIVE   No results found.  Pending Labs [employer] (From admission, onward)   Start     Ordered   Signed and Held  HIV antibody (Routine Testing)  Once,   R     Signed and Held   Signed and Held  CBC  Tomorrow morning,   R     Signed and Held   Signed and Held  Basic metabolic panel  Tomorrow morning,   R     Signed and Held   Signed and Held  Prepare RBC  (Adult Blood Administration - Red Blood Cells)  Once,   R    Question Answer Comment  # of Units 2 units   Transfusion Indications Symptomatic Anemia   If emergent release call blood bank Not emergent release   Instructions: Transfuse      Signed and Held      Vitals/Pain Today's Vitals   10/25/17 1100 10/25/17 1115 10/25/17 1130 10/25/17 1200  BP: (!) 155/54  (!) 162/60 (!) 149/68  Pulse: 60 60 60 60  Resp: '20 16 15 20  ' Temp:      TempSrc:      SpO2: 98% 100% 100% 100%  PainSc:        Isolation Precautions No active isolations  Medications Medications  sodium chloride 0.9 % bolus 1,000 mL (1,000 mLs Intravenous New Bag/Given 10/25/17 1156)    Mobility walks with person assist

## 2017-10-25 NOTE — Consult Note (Addendum)
Referring Provider: [facility]   Primary Care Physician:  [name], DO Primary Gastroenterologist:  Silvano Rusk,  MD  Reason for Consultation: anemia, GI bleed    ASSESSMENT AND PLAN:    1. Symptomatic [place] anemia, Heme positive stools on Eliquis + PPI.   72 yo female with black stools and declining hgb of ~ 14 in March to 7.1 in setting of anticoagulation. No abdominal pain, nausea, vomiting or weight loss.  -She will need EGD. Last Eliquis dose just last night so will plan for procedure to be done in am. The risks and benefits of EGD were discussed and the patient agrees to proceed.  -BID IV PPI -Hgb 7.1.  For transfusion -clear liquids okay, NPO after MN.   2. Hx of HCV, s/p Harvoni with Roosevelt Locks, NP - [place]. Response to treatment unknown. She has ? early cirrhosis on CT scan in March 2019. Platelets and albumin normal. No stigmata of chronic liver disease on exam -check coags  3. AKI on CKD 3. Multiple medical problems as listed below     Attending physician's note   I have taken a history, examined the patient and reviewed the chart. I agree with the Advanced Practitioner's note, impression and recommendations. Symptomatic anemia and heme positive stool on Eliquis. Possible early cirrhosis, post HCV treatment with Harvoni. Colonoscopy 07/2009: diverticulosis and small hyperplastic polyp. Hold Eliquis, trend CBC, EGD tomorrow.   Lucio Edward, MD [facility] [phone] office    HPI: Morgan Clay is a 72 y.o. female with multiple medical problems including CKD3DM2, HTN,  non-ischemic cardiomyopathy chronic combined systolic and diastolic heart failure (EF 60%), afib on Eliquis with DCCV in March, PPM placement, OSA doesn't use CPAP, HCV, s/p Harvoni, and hx of diverticulitis / SBO in late late March of this year.   Patient presennted to ED today with lightheadedness / SOB. She has had black, loose stools since April when discharged from hospital with  diverticulitis vr colitis. She has no associated N/V, abdominal pain or unexplained weight loss.  She does not take iron or Pepto-Bismol.  No history of PUD. She does not take any NSAIDs. Says she was started on Protonix at time of last discharge in April when she had colitis.  She is unclear why Protonix was started, no history of GERD symptoms (I suspect because of mild anemia).  Patient has become increasingly dizzy, finally came to the ED today  In the ED hemoglobin was 11.1, FOBT positive.  Slightly tachypneic but otherwise vital signs stable.   Screening colonoscopy March 2011. Exam was complete with excellent prep. Only a 3 mm hyperplastic polyp was removed, scattered diverticula found.   Past Medical History:  Diagnosis Date  . Arthritis   . Asthma    reports mild asthma since childhood - had COPD on dx list from prior PCP  . Atypical atrial flutter ([place]) 09/28/2015  . Chronic systolic congestive heart failure ([place])   . DEPRESSION 12/17/2009   Annotation: PHQ-9 score = 14 done on 12/17/2009 Qualifier: Diagnosis of  By: Hassell Done FNP, Tori Milks    . FIBROIDS, UTERUS 03/05/2008  . Glaucoma   . HEPATITIS C - s/p treatment with Harvoni, seeing hepatology, Dawn Drazek 01/04/2007  . Hyperlipemia 12/06/2012  . HYPERTENSION, BENIGN 04/24/2007  . Hypothyroidism   . LBBB (left bundle branch block)   . Nonischemic cardiomyopathy ([place])   . OBESITY 05/27/2009  . OBSTRUCTIVE SLEEP APNEA 11/14/2007   npsg 2009:  Mild osa with AHI  11/hr. Started cpap 2009, quit using due to lack of response Home sleep testing 09/2010 with AHI only 7/hr and weight loss advised by pulmonology.  . OSTEOPENIA 09/30/2008  . Personal history of other infectious and parasitic disease    Hepatitis B    Past Surgical History:  Procedure Laterality Date  . appendectomy    . CARDIAC CATHETERIZATION    . CARDIOVERSION N/A 12/14/2014   Procedure: CARDIOVERSION;  Surgeon: Dorothy Spark, MD;  Location: [facility] ENDOSCOPY;  Service:  Cardiovascular;  Laterality: N/A;  . CARDIOVERSION N/A 02/26/2017   Procedure: CARDIOVERSION;  Surgeon: Evans Lance, MD;  Location: [place] CV LAB;  Service: Cardiovascular;  Laterality: N/A;  . CARDIOVERSION N/A 07/24/2017   Procedure: CARDIOVERSION;  Surgeon: Thayer Headings, MD;  Location: [facility] ENDOSCOPY;  Service: Cardiovascular;  Laterality: N/A;  . EP IMPLANTABLE DEVICE N/A 10/06/2015   Procedure: BIV ICD Generator Changeout;  Surgeon: [name], MD;  Location: [place] CV LAB;  Service: Cardiovascular;  Laterality: N/A;    Prior to Admission medications   Medication Sig Start Date End Date Taking? Authorizing Provider  albuterol (PROVENTIL HFA;VENTOLIN HFA) 108 (90 BASE) MCG/ACT inhaler Inhale 2 puffs into the lungs every 6 (six) hours as needed for wheezing or shortness of breath. 01/01/15  Yes [name], DO  amiodarone (PACERONE) 200 MG tablet Take 200 mg by mouth at bedtime.    Yes [provider]  BIDIL 20-37.5 MG tablet TAKE 1 TABLET BY MOUTH TWICE DAILY 04/26/17  Yes [name], MD  carvedilol (COREG) 12.5 MG tablet Take 1 tablet (12.5 mg total) by mouth 2 (two) times daily with a meal. 08/17/17  Yes Hosie Poisson, MD  ELIQUIS 5 MG TABS tablet TAKE ONE TABLET BY MOUTH TWICE DAILY 07/11/17  Yes [name], MD  furosemide (LASIX) 40 MG tablet Take 1 tablet (40 mg total) by mouth 2 (two) times daily. 08/17/17  Yes Hosie Poisson, MD  levothyroxine (SYNTHROID, LEVOTHROID) 125 MCG tablet Take 125 mcg by mouth daily before breakfast.   Yes [provider]  nitroGLYCERIN (NITROSTAT) 0.4 MG SL tablet Place 0.4 mg under the tongue every 5 (five) minutes as needed for chest pain.   Yes [provider]  pantoprazole (PROTONIX) 20 MG tablet Take 1 tablet (20 mg total) by mouth daily. 09/21/17  Yes Colin Benton R, DO  sertraline (ZOLOFT) 50 MG tablet Take 1 tablet (50 mg total) by mouth daily. Patient taking differently: Take 50 mg by mouth at  bedtime.  06/26/17  Yes [name], DO  spironolactone (ALDACTONE) 25 MG tablet Take 1 tablet (25 mg total) daily by mouth. Patient taking differently: Take 25 mg by mouth at bedtime.  03/20/17  Yes Imogene Burn, PA-C  tiZANidine (ZANAFLEX) 2 MG tablet Take 1 tablet (2 mg total) by mouth at bedtime as needed for muscle spasms. 10/09/17  Yes [name], DO  traMADol (ULTRAM) 50 MG tablet Take 2 tablets (100 mg total) by mouth every 12 (twelve) hours as needed (mild pain. use Tylenol first). 08/17/17  Yes Hosie Poisson, MD  Hydrocortisone (GERHARDT'S BUTT CREAM) CREA Apply 1 application topically 2 (two) times daily. Patient not taking: Reported on 10/25/2017 08/17/17   Hosie Poisson, MD  Tetrahydrozoline HCl (VISINE OP) Place 1 drop into both eyes daily as needed (DRY EYES).    [provider]    No current facility-administered medications for this encounter.    Current Outpatient Medications  Medication Sig Dispense Refill  . albuterol (PROVENTIL HFA;VENTOLIN HFA) 108 (90 BASE) MCG/ACT inhaler Inhale 2 puffs into the lungs every 6 (six) hours as needed for wheezing or shortness of breath. 1 Inhaler 3  . amiodarone (PACERONE) 200 MG tablet Take 200 mg by mouth at bedtime.     [name] BIDIL 20-37.5 MG tablet TAKE 1 TABLET BY MOUTH TWICE DAILY 180 tablet 3  . carvedilol (COREG) 12.5 MG tablet Take 1 tablet (12.5 mg total) by mouth 2 (two) times daily with a meal. 60 tablet 0  . ELIQUIS 5 MG TABS tablet TAKE ONE TABLET BY MOUTH TWICE DAILY 180 tablet 3  . furosemide (LASIX) 40 MG tablet Take 1 tablet (40 mg total) by mouth 2 (two) times daily. 60 tablet 0  . levothyroxine (SYNTHROID, LEVOTHROID) 125 MCG tablet Take 125 mcg by mouth daily before breakfast.    . nitroGLYCERIN (NITROSTAT) 0.4 MG SL tablet Place 0.4 mg under the tongue every 5 (five) minutes as needed for chest pain.    . pantoprazole (PROTONIX) 20 MG tablet Take 1 tablet (20 mg total) by mouth daily. 90 tablet 0  . sertraline  (ZOLOFT) 50 MG tablet Take 1 tablet (50 mg total) by mouth daily. (Patient taking differently: Take 50 mg by mouth at bedtime. ) 30 tablet 3  . spironolactone (ALDACTONE) 25 MG tablet Take 1 tablet (25 mg total) daily by mouth. (Patient taking differently: Take 25 mg by mouth at bedtime. ) 90 tablet 3  . tiZANidine (ZANAFLEX) 2 MG tablet Take 1 tablet (2 mg total) by mouth at bedtime as needed for muscle spasms. 20 tablet 0  . traMADol (ULTRAM) 50 MG tablet Take 2 tablets (100 mg total) by mouth every 12 (twelve) hours as needed (mild pain. use Tylenol first). 8 tablet 0  . Hydrocortisone (GERHARDT'S BUTT CREAM) CREA Apply 1 application topically 2 (two) times daily. (Patient not taking: Reported on 10/25/2017) 1 each 2  . Tetrahydrozoline HCl (VISINE OP) Place 1 drop into both eyes daily as needed (DRY EYES).      Allergies as of 10/25/2017  . (No Known Allergies)    Family History  Problem Relation Age of Onset  . Asthma Father   . Heart attack Father   . Asthma Sister   . Cancer Sister        lung  . Heart attack Mother   . Stroke Brother   . Diabetes Unknown     Social History   Socioeconomic History  . Marital status: Single    Spouse name: Not on file  . Number of children: Not on file  . Years of education: Not on file  . Highest education level: Not on file  Occupational History  . Not on file  Social Needs  . Financial resource strain: Not on file  . Food insecurity:    Worry: Not on file    Inability: Not on file  . Transportation needs:    Medical: Not on file    Non-medical: Not on file  Tobacco Use  . Smoking status: Never Smoker  . Smokeless tobacco: Never Used  Substance and Sexual Activity  . Alcohol use: Yes    Comment: 3 glasses of wine 3-4 times per week  . Drug use: No  . Sexual activity: Not on file  Lifestyle  . Physical activity:    Days per week: Not on file    Minutes per session: Not on file  . Stress: Not on file  Relationships  .  Social connections:    Talks on phone: Not on file    Gets together: Not on file    Attends religious service: Not on file    Active member of club or organization: Not on file    Attends meetings of clubs or organizations: Not on file    Relationship status: Not on file  . Intimate partner violence:    Fear of current or ex partner: Not on file    Emotionally abused: Not on file    Physically abused: Not on file    Forced sexual activity: Not on file  Other Topics Concern  . Not on file  Social History Narrative   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      Home Situation: lives alone      Spiritual Beliefs: [place]      Lifestyle: no regular exercise; poor diet             Review of Systems: All systems reviewed and negative except where noted in HPI.  Physical Exam: Vital signs in last 24 hours: Temp:  [98.4 F (36.9 C)] 98.4 F (36.9 C) (06/20 1014) Pulse Rate:  [59-62] 60 (06/20 1311) Resp:  [14-24] 24 (06/20 1311) BP: (132-179)/(54-89) 177/66 (06/20 1311) SpO2:  [98 %-100 %] 98 % (06/20 1311)   General:   Alert, well-developed,  female in NAD Psych:  Pleasant, cooperative. Normal mood and affect. Eyes:  Pupils equal, sclera clear, no icterus.   Conjunctiva pink. Ears:  Normal auditory acuity. Nose:  No deformity, discharge,  or lesions. Neck:  Supple; no masses Lungs:  Clear throughout to auscultation.   No wheezes, crackles, or rhonchi.  Heart:  Regular rate and rhythm; + murmur, no edema Abdomen:  Soft, non-distended, nontender, BS active, no palp mass    Rectal:  Deferred  Msk:  Symmetrical without gross deformities. . Neurologic:  Alert and  oriented x4;  grossly normal neurologically. Skin:  Intact without significant lesions or rashes..   Intake/Output from previous day: No intake/output data recorded. Intake/Output this shift: No intake/output data recorded.  Lab Results: Recent Labs    10/25/17 1042  WBC 8.1  HGB 7.1*  HCT  22.9*  PLT 246   BMET Recent Labs    10/25/17 1042  NA 139  K 4.7  CL 108  CO2 22  GLUCOSE 156*  BUN 77*  CREATININE 2.70*  CALCIUM 9.3   LFT Recent Labs    10/25/17 1042  PROT 8.6*  ALBUMIN 4.5  AST 22  ALT 13*  ALKPHOS 53  BILITOT 0.2*   PT/INR No results for input(s): LABPROT, INR in the last 72 hours. Hepatitis Panel No results for input(s): HEPBSAG, HCVAB, HEPAIGM, HEPBIGM in the last 72 hours.   Studies/Results: No results found.   Tye Savoy, NP-C @  10/25/2017, 1:38 PM

## 2017-10-25 NOTE — ED Triage Notes (Addendum)
Patient here from home with complaints of abnormal labs. Dr. Gabriel Carina called about hgb of 7. Possible GI bleed. Denies abd pain,nasuea, vomiting. Patient on Eliquis.

## 2017-10-25 NOTE — Telephone Encounter (Signed)
That is pretty low in complicated pt on anticoagulation. Recommend ER if confirmed as is difficult to get in with GI quickly and will need coordination with cardiologist.

## 2017-10-25 NOTE — H&P (View-Only) (Signed)
Referring Provider: [facility]   Primary Care Physician:  [name], DO Primary Gastroenterologist:  Silvano Rusk,  MD  Reason for Consultation: anemia, GI bleed    ASSESSMENT AND PLAN:    1. Symptomatic [place] anemia, Heme positive stools on Eliquis + PPI.   72 yo female with black stools and declining hgb of ~ 14 in March to 7.1 in setting of anticoagulation. No abdominal pain, nausea, vomiting or weight loss.  -She will need EGD. Last Eliquis dose just last night so will plan for procedure to be done in am. The risks and benefits of EGD were discussed and the patient agrees to proceed.  -BID IV PPI -Hgb 7.1.  For transfusion -clear liquids okay, NPO after MN.   2. Hx of HCV, s/p Harvoni with Roosevelt Locks, NP - [place]. Response to treatment unknown. She has ? early cirrhosis on CT scan in March 2019. Platelets and albumin normal. No stigmata of chronic liver disease on exam -check coags  3. AKI on CKD 3. Multiple medical problems as listed below     Attending physician's note   I have taken a history, examined the patient and reviewed the chart. I agree with the Advanced Practitioner's note, impression and recommendations. Symptomatic anemia and heme positive stool on Eliquis. Possible early cirrhosis, post HCV treatment with Harvoni. Colonoscopy 07/2009: diverticulosis and small hyperplastic polyp. Hold Eliquis, trend CBC, EGD tomorrow.   Lucio Edward, MD [facility] [phone] office    HPI: Morgan Clay is a 72 y.o. female with multiple medical problems including CKD3DM2, HTN,  non-ischemic cardiomyopathy chronic combined systolic and diastolic heart failure (EF 60%), afib on Eliquis with DCCV in March, PPM placement, OSA doesn't use CPAP, HCV, s/p Harvoni, and hx of diverticulitis / SBO in late late March of this year.   Patient presennted to ED today with lightheadedness / SOB. She has had black, loose stools since April when discharged from hospital with  diverticulitis vr colitis. She has no associated N/V, abdominal pain or unexplained weight loss.  She does not take iron or Pepto-Bismol.  No history of PUD. She does not take any NSAIDs. Says she was started on Protonix at time of last discharge in April when she had colitis.  She is unclear why Protonix was started, no history of GERD symptoms (I suspect because of mild anemia).  Patient has become increasingly dizzy, finally came to the ED today  In the ED hemoglobin was 11.1, FOBT positive.  Slightly tachypneic but otherwise vital signs stable.   Screening colonoscopy March 2011. Exam was complete with excellent prep. Only a 3 mm hyperplastic polyp was removed, scattered diverticula found.   Past Medical History:  Diagnosis Date  . Arthritis   . Asthma    reports mild asthma since childhood - had COPD on dx list from prior PCP  . Atypical atrial flutter ([place]) 09/28/2015  . Chronic systolic congestive heart failure ([place])   . DEPRESSION 12/17/2009   Annotation: PHQ-9 score = 14 done on 12/17/2009 Qualifier: Diagnosis of  By: Hassell Done FNP, Tori Milks    . FIBROIDS, UTERUS 03/05/2008  . Glaucoma   . HEPATITIS C - s/p treatment with Harvoni, seeing hepatology, Dawn Drazek 01/04/2007  . Hyperlipemia 12/06/2012  . HYPERTENSION, BENIGN 04/24/2007  . Hypothyroidism   . LBBB (left bundle branch block)   . Nonischemic cardiomyopathy ([place])   . OBESITY 05/27/2009  . OBSTRUCTIVE SLEEP APNEA 11/14/2007   npsg 2009:  Mild osa with AHI  11/hr. Started cpap 2009, quit using due to lack of response Home sleep testing 09/2010 with AHI only 7/hr and weight loss advised by pulmonology.  . OSTEOPENIA 09/30/2008  . Personal history of other infectious and parasitic disease    Hepatitis B    Past Surgical History:  Procedure Laterality Date  . appendectomy    . CARDIAC CATHETERIZATION    . CARDIOVERSION N/A 12/14/2014   Procedure: CARDIOVERSION;  Surgeon: Dorothy Spark, MD;  Location: [facility] ENDOSCOPY;  Service:  Cardiovascular;  Laterality: N/A;  . CARDIOVERSION N/A 02/26/2017   Procedure: CARDIOVERSION;  Surgeon: Evans Lance, MD;  Location: [place] CV LAB;  Service: Cardiovascular;  Laterality: N/A;  . CARDIOVERSION N/A 07/24/2017   Procedure: CARDIOVERSION;  Surgeon: Thayer Headings, MD;  Location: [facility] ENDOSCOPY;  Service: Cardiovascular;  Laterality: N/A;  . EP IMPLANTABLE DEVICE N/A 10/06/2015   Procedure: BIV ICD Generator Changeout;  Surgeon: [name], MD;  Location: [place] CV LAB;  Service: Cardiovascular;  Laterality: N/A;    Prior to Admission medications   Medication Sig Start Date End Date Taking? Authorizing Provider  albuterol (PROVENTIL HFA;VENTOLIN HFA) 108 (90 BASE) MCG/ACT inhaler Inhale 2 puffs into the lungs every 6 (six) hours as needed for wheezing or shortness of breath. 01/01/15  Yes [name], DO  amiodarone (PACERONE) 200 MG tablet Take 200 mg by mouth at bedtime.    Yes [provider]  BIDIL 20-37.5 MG tablet TAKE 1 TABLET BY MOUTH TWICE DAILY 04/26/17  Yes [name], MD  carvedilol (COREG) 12.5 MG tablet Take 1 tablet (12.5 mg total) by mouth 2 (two) times daily with a meal. 08/17/17  Yes Hosie Poisson, MD  ELIQUIS 5 MG TABS tablet TAKE ONE TABLET BY MOUTH TWICE DAILY 07/11/17  Yes [name], MD  furosemide (LASIX) 40 MG tablet Take 1 tablet (40 mg total) by mouth 2 (two) times daily. 08/17/17  Yes Hosie Poisson, MD  levothyroxine (SYNTHROID, LEVOTHROID) 125 MCG tablet Take 125 mcg by mouth daily before breakfast.   Yes [provider]  nitroGLYCERIN (NITROSTAT) 0.4 MG SL tablet Place 0.4 mg under the tongue every 5 (five) minutes as needed for chest pain.   Yes [provider]  pantoprazole (PROTONIX) 20 MG tablet Take 1 tablet (20 mg total) by mouth daily. 09/21/17  Yes Colin Benton R, DO  sertraline (ZOLOFT) 50 MG tablet Take 1 tablet (50 mg total) by mouth daily. Patient taking differently: Take 50 mg by mouth at  bedtime.  06/26/17  Yes [name], DO  spironolactone (ALDACTONE) 25 MG tablet Take 1 tablet (25 mg total) daily by mouth. Patient taking differently: Take 25 mg by mouth at bedtime.  03/20/17  Yes Imogene Burn, PA-C  tiZANidine (ZANAFLEX) 2 MG tablet Take 1 tablet (2 mg total) by mouth at bedtime as needed for muscle spasms. 10/09/17  Yes [name], DO  traMADol (ULTRAM) 50 MG tablet Take 2 tablets (100 mg total) by mouth every 12 (twelve) hours as needed (mild pain. use Tylenol first). 08/17/17  Yes Hosie Poisson, MD  Hydrocortisone (GERHARDT'S BUTT CREAM) CREA Apply 1 application topically 2 (two) times daily. Patient not taking: Reported on 10/25/2017 08/17/17   Hosie Poisson, MD  Tetrahydrozoline HCl (VISINE OP) Place 1 drop into both eyes daily as needed (DRY EYES).    [provider]    No current facility-administered medications for this encounter.    Current Outpatient Medications  Medication Sig Dispense Refill  . albuterol (PROVENTIL HFA;VENTOLIN HFA) 108 (90 BASE) MCG/ACT inhaler Inhale 2 puffs into the lungs every 6 (six) hours as needed for wheezing or shortness of breath. 1 Inhaler 3  . amiodarone (PACERONE) 200 MG tablet Take 200 mg by mouth at bedtime.     [name] BIDIL 20-37.5 MG tablet TAKE 1 TABLET BY MOUTH TWICE DAILY 180 tablet 3  . carvedilol (COREG) 12.5 MG tablet Take 1 tablet (12.5 mg total) by mouth 2 (two) times daily with a meal. 60 tablet 0  . ELIQUIS 5 MG TABS tablet TAKE ONE TABLET BY MOUTH TWICE DAILY 180 tablet 3  . furosemide (LASIX) 40 MG tablet Take 1 tablet (40 mg total) by mouth 2 (two) times daily. 60 tablet 0  . levothyroxine (SYNTHROID, LEVOTHROID) 125 MCG tablet Take 125 mcg by mouth daily before breakfast.    . nitroGLYCERIN (NITROSTAT) 0.4 MG SL tablet Place 0.4 mg under the tongue every 5 (five) minutes as needed for chest pain.    . pantoprazole (PROTONIX) 20 MG tablet Take 1 tablet (20 mg total) by mouth daily. 90 tablet 0  . sertraline  (ZOLOFT) 50 MG tablet Take 1 tablet (50 mg total) by mouth daily. (Patient taking differently: Take 50 mg by mouth at bedtime. ) 30 tablet 3  . spironolactone (ALDACTONE) 25 MG tablet Take 1 tablet (25 mg total) daily by mouth. (Patient taking differently: Take 25 mg by mouth at bedtime. ) 90 tablet 3  . tiZANidine (ZANAFLEX) 2 MG tablet Take 1 tablet (2 mg total) by mouth at bedtime as needed for muscle spasms. 20 tablet 0  . traMADol (ULTRAM) 50 MG tablet Take 2 tablets (100 mg total) by mouth every 12 (twelve) hours as needed (mild pain. use Tylenol first). 8 tablet 0  . Hydrocortisone (GERHARDT'S BUTT CREAM) CREA Apply 1 application topically 2 (two) times daily. (Patient not taking: Reported on 10/25/2017) 1 each 2  . Tetrahydrozoline HCl (VISINE OP) Place 1 drop into both eyes daily as needed (DRY EYES).      Allergies as of 10/25/2017  . (No Known Allergies)    Family History  Problem Relation Age of Onset  . Asthma Father   . Heart attack Father   . Asthma Sister   . Cancer Sister        lung  . Heart attack Mother   . Stroke Brother   . Diabetes Unknown     Social History   Socioeconomic History  . Marital status: Single    Spouse name: Not on file  . Number of children: Not on file  . Years of education: Not on file  . Highest education level: Not on file  Occupational History  . Not on file  Social Needs  . Financial resource strain: Not on file  . Food insecurity:    Worry: Not on file    Inability: Not on file  . Transportation needs:    Medical: Not on file    Non-medical: Not on file  Tobacco Use  . Smoking status: Never Smoker  . Smokeless tobacco: Never Used  Substance and Sexual Activity  . Alcohol use: Yes    Comment: 3 glasses of wine 3-4 times per week  . Drug use: No  . Sexual activity: Not on file  Lifestyle  . Physical activity:    Days per week: Not on file    Minutes per session: Not on file  . Stress: Not on file  Relationships  .  Social connections:    Talks on phone: Not on file    Gets together: Not on file    Attends religious service: Not on file    Active member of club or organization: Not on file    Attends meetings of clubs or organizations: Not on file    Relationship status: Not on file  . Intimate partner violence:    Fear of current or ex partner: Not on file    Emotionally abused: Not on file    Physically abused: Not on file    Forced sexual activity: Not on file  Other Topics Concern  . Not on file  Social History Narrative   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      Home Situation: lives alone      Spiritual Beliefs: [place]      Lifestyle: no regular exercise; poor diet             Review of Systems: All systems reviewed and negative except where noted in HPI.  Physical Exam: Vital signs in last 24 hours: Temp:  [98.4 F (36.9 C)] 98.4 F (36.9 C) (06/20 1014) Pulse Rate:  [59-62] 60 (06/20 1311) Resp:  [14-24] 24 (06/20 1311) BP: (132-179)/(54-89) 177/66 (06/20 1311) SpO2:  [98 %-100 %] 98 % (06/20 1311)   General:   Alert, well-developed,  female in NAD Psych:  Pleasant, cooperative. Normal mood and affect. Eyes:  Pupils equal, sclera clear, no icterus.   Conjunctiva pink. Ears:  Normal auditory acuity. Nose:  No deformity, discharge,  or lesions. Neck:  Supple; no masses Lungs:  Clear throughout to auscultation.   No wheezes, crackles, or rhonchi.  Heart:  Regular rate and rhythm; + murmur, no edema Abdomen:  Soft, non-distended, nontender, BS active, no palp mass    Rectal:  Deferred  Msk:  Symmetrical without gross deformities. . Neurologic:  Alert and  oriented x4;  grossly normal neurologically. Skin:  Intact without significant lesions or rashes..   Intake/Output from previous day: No intake/output data recorded. Intake/Output this shift: No intake/output data recorded.  Lab Results: Recent Labs    10/25/17 1042  WBC 8.1  HGB 7.1*  HCT  22.9*  PLT 246   BMET Recent Labs    10/25/17 1042  NA 139  K 4.7  CL 108  CO2 22  GLUCOSE 156*  BUN 77*  CREATININE 2.70*  CALCIUM 9.3   LFT Recent Labs    10/25/17 1042  PROT 8.6*  ALBUMIN 4.5  AST 22  ALT 13*  ALKPHOS 53  BILITOT 0.2*   PT/INR No results for input(s): LABPROT, INR in the last 72 hours. Hepatitis Panel No results for input(s): HEPBSAG, HCVAB, HEPAIGM, HEPBIGM in the last 72 hours.   Studies/Results: No results found.   Tye Savoy, NP-C @  10/25/2017, 1:38 PM

## 2017-10-25 NOTE — Telephone Encounter (Signed)
Pt is scheduled for transfusion at 11am and then to see you at 4:15pm today.   Dr. [name] - FYI. Thanks!

## 2017-10-25 NOTE — ED Provider Notes (Signed)
[facility] DEPT Provider Note   CSN: [id number] Arrival date & time: 10/25/17  4081     History   Chief Complaint Chief Complaint  Patient presents with  . Abnormal Lab    HPI Morgan Clay is a 72 y.o. female.  HPI  72 year old female with a history of atrial flutter who is on Eliquis presents with anemia.  She was called by her PCP due to a hemoglobin of 7.  This was drawn 2 days ago.  The patient has had progressive shortness of breath and fatigue/lightheadedness since being admitted and discharged in April.  At that time she had diverticulitis.  She states since then she has been having black stools.  No vomiting.  No chest pain or abdominal pain.  She has had progressively decreasing hemoglobin to the point that she received an iron transfusion last month.  She transiently felt better but now is feeling more fatigued and short of breath again.  Past Medical History:  Diagnosis Date  . Arthritis   . Asthma    reports mild asthma since childhood - had COPD on dx list from prior PCP  . Atypical atrial flutter ([place]) 09/28/2015  . Chronic systolic congestive heart failure ([place])   . DEPRESSION 12/17/2009   Annotation: PHQ-9 score = 14 done on 12/17/2009 Qualifier: Diagnosis of  By: Hassell Done FNP, Tori Milks    . FIBROIDS, UTERUS 03/05/2008  . Glaucoma   . HEPATITIS C - s/p treatment with Harvoni, seeing hepatology, Dawn Drazek 01/04/2007  . Hyperlipemia 12/06/2012  . HYPERTENSION, BENIGN 04/24/2007  . Hypothyroidism   . LBBB (left bundle branch block)   . Nonischemic cardiomyopathy ([place])   . OBESITY 05/27/2009  . OBSTRUCTIVE SLEEP APNEA 11/14/2007   npsg 2009:  Mild osa with AHI 11/hr. Started cpap 2009, quit using due to lack of response Home sleep testing 09/2010 with AHI only 7/hr and weight loss advised by pulmonology.  . OSTEOPENIA 09/30/2008  . Personal history of other infectious and parasitic disease    Hepatitis B    Patient Active Problem  List   Diagnosis Date Noted  . GI bleed 10/25/2017  . Symptomatic anemia 10/25/2017  . Atrial fibrillation, chronic ([place]) 10/25/2017  . Aortic atherosclerosis ([place]) 08/30/2017  . Pulmonary edema, acute ([place]) 08/09/2017  . Pain and swelling of left upper extremity 08/08/2017  . ARF (acute renal failure) ([place])   . Enteritis   . Partial small bowel obstruction ([place])   . Diverticulitis 07/31/2017  . Persistent atrial fibrillation ([place])   . Hypertension associated with diabetes ([place]) 06/07/2017  . MDD (major depressive disorder), recurrent, in partial remission ([place]) 06/07/2017  . Asthma 05/22/2017  . Chronic obstructive pulmonary disease ([place])   . Atypical atrial flutter ([place]) 09/28/2015  . Type 2 diabetes, controlled, with renal manifestation ([place]) 11/24/2013  . Chronic kidney disease - followed by [facility] 11/24/2013  . Nonischemic cardiomyopathy ([place]) 11/22/2010  . Obesity 05/27/2009  . OSTEOPENIA 09/30/2008  . OBSTRUCTIVE SLEEP APNEA 11/14/2007  . Chronic diastolic CHF (congestive heart failure) ([place]) 04/24/2007  . Hypothyroidism 01/28/2007  . Biventricular ICD (implantable cardioverter-defibrillator) in place 01/08/2007    Past Surgical History:  Procedure Laterality Date  . appendectomy    . CARDIAC CATHETERIZATION    . CARDIOVERSION N/A 12/14/2014   Procedure: CARDIOVERSION;  Surgeon: Dorothy Spark, MD;  Location: [facility] ENDOSCOPY;  Service: Cardiovascular;  Laterality: N/A;  . CARDIOVERSION N/A 02/26/2017   Procedure: CARDIOVERSION;  Surgeon: Evans Lance, MD;  Location: [place] CV LAB;  Service: Cardiovascular;  Laterality: N/A;  . CARDIOVERSION N/A 07/24/2017   Procedure: CARDIOVERSION;  Surgeon: Thayer Headings, MD;  Location: [facility] ENDOSCOPY;  Service: Cardiovascular;  Laterality: N/A;  . EP IMPLANTABLE DEVICE N/A 10/06/2015   Procedure: BIV ICD Generator Changeout;  Surgeon: [name], MD;  Location: [place] CV LAB;  Service: Cardiovascular;   Laterality: N/A;     OB History   None      Home Medications    Prior to Admission medications   Medication Sig Start Date End Date Taking? Authorizing Provider  albuterol (PROVENTIL HFA;VENTOLIN HFA) 108 (90 BASE) MCG/ACT inhaler Inhale 2 puffs into the lungs every 6 (six) hours as needed for wheezing or shortness of breath. 01/01/15  Yes [name], DO  amiodarone (PACERONE) 200 MG tablet Take 200 mg by mouth at bedtime.    Yes [provider]  BIDIL 20-37.5 MG tablet TAKE 1 TABLET BY MOUTH TWICE DAILY 04/26/17  Yes [name], MD  carvedilol (COREG) 12.5 MG tablet Take 1 tablet (12.5 mg total) by mouth 2 (two) times daily with a meal. 08/17/17  Yes Hosie Poisson, MD  ELIQUIS 5 MG TABS tablet TAKE ONE TABLET BY MOUTH TWICE DAILY 07/11/17  Yes [name], MD  furosemide (LASIX) 40 MG tablet Take 1 tablet (40 mg total) by mouth 2 (two) times daily. 08/17/17  Yes Hosie Poisson, MD  levothyroxine (SYNTHROID, LEVOTHROID) 125 MCG tablet Take 125 mcg by mouth daily before breakfast.   Yes [provider]  nitroGLYCERIN (NITROSTAT) 0.4 MG SL tablet Place 0.4 mg under the tongue every 5 (five) minutes as needed for chest pain.   Yes [provider]  pantoprazole (PROTONIX) 20 MG tablet Take 1 tablet (20 mg total) by mouth daily. 09/21/17  Yes Colin Benton R, DO  sertraline (ZOLOFT) 50 MG tablet Take 1 tablet (50 mg total) by mouth daily. Patient taking differently: Take 50 mg by mouth at bedtime.  06/26/17  Yes [name], DO  spironolactone (ALDACTONE) 25 MG tablet Take 1 tablet (25 mg total) daily by mouth. Patient taking differently: Take 25 mg by mouth at bedtime.  03/20/17  Yes Imogene Burn, PA-C  tiZANidine (ZANAFLEX) 2 MG tablet Take 1 tablet (2 mg total) by mouth at bedtime as needed for muscle spasms. 10/09/17  Yes [name], DO  traMADol (ULTRAM) 50 MG tablet Take 2 tablets (100 mg total) by mouth every 12 (twelve) hours as needed (mild pain. use  Tylenol first). 08/17/17  Yes Hosie Poisson, MD  Hydrocortisone (GERHARDT'S BUTT CREAM) CREA Apply 1 application topically 2 (two) times daily. Patient not taking: Reported on 10/25/2017 08/17/17   Hosie Poisson, MD  Tetrahydrozoline HCl (VISINE OP) Place 1 drop into both eyes daily as needed (DRY EYES).    [provider]    Family History Family History  Problem Relation Age of Onset  . Asthma Father   . Heart attack Father   . Asthma Sister   . Cancer Sister        lung  . Heart attack Mother   . Stroke Brother   . Diabetes Unknown     Social History Social History   Tobacco Use  . Smoking status: Never Smoker  . Smokeless tobacco: Never Used  Substance Use Topics  . Alcohol use: Yes    Comment: 3 glasses of wine 3-4 times per week  . Drug use: No  Allergies   Patient has no known allergies.   Review of Systems Review of Systems  Constitutional: Positive for fatigue.  Respiratory: Positive for shortness of breath.   Cardiovascular: Negative for chest pain.  Gastrointestinal: Negative for abdominal pain, nausea, rectal pain and vomiting.       Black stools  Neurological: Positive for light-headedness.  All other systems reviewed and are negative.    Physical Exam Updated Vital Signs BP (!) 153/47 (BP Location: Left Arm)   Pulse 60   Temp (!) 97.4 F (36.3 C) (Oral)   Resp 16   Ht 4\' 11"  (1.499 m)   Wt 65.2 kg (143 lb 11.8 oz)   SpO2 100%   BMI 29.03 kg/m   Physical Exam  Constitutional: She is oriented to person, place, and time. She appears well-developed and well-nourished. No distress.  HENT:  Head: Normocephalic and atraumatic.  Right Ear: External ear normal.  Left Ear: External ear normal.  Nose: Nose normal.  Eyes: Right eye exhibits no discharge. Left eye exhibits no discharge.  Cardiovascular: Normal rate, regular rhythm and normal heart sounds.  Pulmonary/Chest: Effort normal and breath sounds normal.  Abdominal: Soft. There  is no tenderness.  Genitourinary: Rectal exam shows no external hemorrhoid, no internal hemorrhoid, no fissure, no mass, no tenderness and anal tone normal.  Genitourinary Comments: Minimal light brown stool on DRE  Neurological: She is alert and oriented to person, place, and time.  Skin: Skin is warm and dry. She is not diaphoretic.  Nursing note and vitals reviewed.    ED Treatments / Results  Labs (all labs ordered are listed, but only abnormal results are displayed) Labs Reviewed  COMPREHENSIVE METABOLIC PANEL - Abnormal; Notable for the following components:      Result Value   Glucose, Bld 156 (*)    BUN 77 (*)    Creatinine, Ser 2.70 (*)    Total Protein 8.6 (*)    ALT 13 (*)    Total Bilirubin 0.2 (*)    GFR calc non Af Amer 17 (*)    GFR calc Af Amer 20 (*)    All other components within normal limits  CBC - Abnormal; Notable for the following components:   RBC 2.45 (*)    Hemoglobin 7.1 (*)    HCT 22.9 (*)    RDW 16.4 (*)    All other components within normal limits  PROTIME-INR - Abnormal; Notable for the following components:   Prothrombin Time 16.5 (*)    All other components within normal limits  POC OCCULT BLOOD, ED - Abnormal; Notable for the following components:   Fecal Occult Bld POSITIVE (*)    All other components within normal limits  HIV ANTIBODY (ROUTINE TESTING)  TYPE AND SCREEN  ABO/RH  PREPARE RBC (CROSSMATCH)    EKG None  Radiology No results found.  Procedures Procedures (including critical care time)  Medications Ordered in ED Medications  ondansetron (ZOFRAN) tablet 4 mg (has no administration in time range)    Or  ondansetron (ZOFRAN) injection 4 mg (has no administration in time range)  0.9 %  sodium chloride infusion (Manually program via Guardrails IV Fluids) (has no administration in time range)  sodium chloride 0.9 % bolus 1,000 mL (1,000 mLs Intravenous New Bag/Given 10/25/17 1156)     Initial Impression / Assessment  and Plan / ED Course  I have reviewed the triage vital signs and the nursing notes.  Pertinent labs & imaging results that were available  during my care of the patient were reviewed by me and considered in my medical decision making (see chart for details).     Patient's hemoglobin is 7.1.  With her reports of melena there is concern for GI bleed.  She is hemodynamically stable.  I have consulted GI who will come see the patient.  Hospitalist will admit.  Final Clinical Impressions(s) / ED Diagnoses   Final diagnoses:  Symptomatic anemia  Acute kidney injury superimposed on chronic kidney disease [facility])    ED Discharge Orders    None       Sherwood Gambler, MD 10/25/17 [id number]

## 2017-10-25 NOTE — H&P (Signed)
History and Physical    Morgan Clay:[id number] DOB: February 16, 1946 DOA: 10/25/2017  PCP: [name], DO  Patient coming from: Home  Chief Complaint: SOB and fatigue   HPI: Morgan Clay is a 72 y.o. female with medical history significant of NICM (last EF 45%) s/pCRT-D, A. fib on Eliquis with recent DCCV on 07/24/17, OSA on CPAP, HCV status post Harvoni, hypothyroidism, depression and diet managed type 2 diabetes mellitus who was recently admitted to the hospital in April 2019 due to acute diverticulitis.  Her last colonoscopy was July 09, 2009 which found a polyp and diverticulosis.  She states that since her discharge from the hospital in April 2019, she has not felt back to her baseline. She had her labs checked by her primary care physician, referred to the emergency department due to hemoglobin 7.0. She admits to generalized fatigue, lightheadedness and shortness of breath.  She also admits to black stools.  No bright red blood per rectum, no nausea or vomiting or abdominal pain.  She does take Eliquis on daily basis, no other NSAID use reported.  She denies any other blood loss, no vaginal bleeding or hematemesis.  Chest pain or cough, diarrhea.    ED Course: Labs revealed hemoglobin 7.1 and positive fecal occult blood.  [facility] was consulted and patient referred for admission  Review of Systems: As per HPI otherwise 10 point review of systems negative.   Past Medical History:  Diagnosis Date  . Arthritis   . Asthma    reports mild asthma since childhood - had COPD on dx list from prior PCP  . Atypical atrial flutter ([place]) 09/28/2015  . Chronic systolic congestive heart failure ([place])   . DEPRESSION 12/17/2009   Annotation: PHQ-9 score = 14 done on 12/17/2009 Qualifier: Diagnosis of  By: Hassell Done FNP, Tori Milks    . FIBROIDS, UTERUS 03/05/2008  . Glaucoma   . HEPATITIS C - s/p treatment with Harvoni, seeing hepatology, Dawn Drazek 01/04/2007  . Hyperlipemia 12/06/2012  .  HYPERTENSION, BENIGN 04/24/2007  . Hypothyroidism   . LBBB (left bundle branch block)   . Nonischemic cardiomyopathy ([place])   . OBESITY 05/27/2009  . OBSTRUCTIVE SLEEP APNEA 11/14/2007   npsg 2009:  Mild osa with AHI 11/hr. Started cpap 2009, quit using due to lack of response Home sleep testing 09/2010 with AHI only 7/hr and weight loss advised by pulmonology.  . OSTEOPENIA 09/30/2008  . Personal history of other infectious and parasitic disease    Hepatitis B    Past Surgical History:  Procedure Laterality Date  . appendectomy    . CARDIAC CATHETERIZATION    . CARDIOVERSION N/A 12/14/2014   Procedure: CARDIOVERSION;  Surgeon: Dorothy Spark, MD;  Location: [facility] ENDOSCOPY;  Service: Cardiovascular;  Laterality: N/A;  . CARDIOVERSION N/A 02/26/2017   Procedure: CARDIOVERSION;  Surgeon: Evans Lance, MD;  Location: [place] [facility] CV LAB;  Service: Cardiovascular;  Laterality: N/A;  . CARDIOVERSION N/A 07/24/2017   Procedure: CARDIOVERSION;  Surgeon: Thayer Headings, MD;  Location: [facility] ENDOSCOPY;  Service: Cardiovascular;  Laterality: N/A;  . EP IMPLANTABLE DEVICE N/A 10/06/2015   Procedure: BIV ICD Generator Changeout;  Surgeon: [name], MD;  Location: [place] CV LAB;  Service: Cardiovascular;  Laterality: N/A;     reports that she has never smoked. She has never used smokeless tobacco. She reports that she drinks alcohol. She reports that she does not use drugs.  No Known Allergies  Family History  Problem Relation Age  of Onset  . Asthma Father   . Heart attack Father   . Asthma Sister   . Cancer Sister        lung  . Heart attack Mother   . Stroke Brother   . Diabetes Unknown     Prior to Admission medications   Medication Sig Start Date End Date Taking? Authorizing Provider  albuterol (PROVENTIL HFA;VENTOLIN HFA) 108 (90 BASE) MCG/ACT inhaler Inhale 2 puffs into the lungs every 6 (six) hours as needed for wheezing or shortness of breath. 01/01/15  Yes [name],  DO  amiodarone (PACERONE) 200 MG tablet Take 200 mg by mouth at bedtime.    Yes [provider]  BIDIL 20-37.5 MG tablet TAKE 1 TABLET BY MOUTH TWICE DAILY 04/26/17  Yes [name], MD  carvedilol (COREG) 12.5 MG tablet Take 1 tablet (12.5 mg total) by mouth 2 (two) times daily with a meal. 08/17/17  Yes Hosie Poisson, MD  ELIQUIS 5 MG TABS tablet TAKE ONE TABLET BY MOUTH TWICE DAILY 07/11/17  Yes [name], MD  furosemide (LASIX) 40 MG tablet Take 1 tablet (40 mg total) by mouth 2 (two) times daily. 08/17/17  Yes Hosie Poisson, MD  levothyroxine (SYNTHROID, LEVOTHROID) 125 MCG tablet Take 125 mcg by mouth daily before breakfast.   Yes [provider]  nitroGLYCERIN (NITROSTAT) 0.4 MG SL tablet Place 0.4 mg under the tongue every 5 (five) minutes as needed for chest pain.   Yes [provider]  pantoprazole (PROTONIX) 20 MG tablet Take 1 tablet (20 mg total) by mouth daily. 09/21/17  Yes Colin Benton R, DO  sertraline (ZOLOFT) 50 MG tablet Take 1 tablet (50 mg total) by mouth daily. Patient taking differently: Take 50 mg by mouth at bedtime.  06/26/17  Yes [name], DO  spironolactone (ALDACTONE) 25 MG tablet Take 1 tablet (25 mg total) daily by mouth. Patient taking differently: Take 25 mg by mouth at bedtime.  03/20/17  Yes Imogene Burn, PA-C  tiZANidine (ZANAFLEX) 2 MG tablet Take 1 tablet (2 mg total) by mouth at bedtime as needed for muscle spasms. 10/09/17  Yes [name], DO  traMADol (ULTRAM) 50 MG tablet Take 2 tablets (100 mg total) by mouth every 12 (twelve) hours as needed (mild pain. use Tylenol first). 08/17/17  Yes Hosie Poisson, MD  Hydrocortisone (GERHARDT'S BUTT CREAM) CREA Apply 1 application topically 2 (two) times daily. Patient not taking: Reported on 10/25/2017 08/17/17   Hosie Poisson, MD  Tetrahydrozoline HCl (VISINE OP) Place 1 drop into both eyes daily as needed (DRY EYES).    [provider]    Physical Exam: Vitals:    10/25/17 1100 10/25/17 1115 10/25/17 1130 10/25/17 1200  BP: (!) 155/54  (!) 162/60 (!) 149/68  Pulse: 60 60 60 60  Resp: 20 16 15 20   Temp:      TempSrc:      SpO2: 98% 100% 100% 100%     Constitutional: NAD, calm, comfortable Eyes: PERRL, lids and conjunctivae normal ENMT: Mucous membranes are moist. Posterior pharynx clear of any exudate or lesions.Normal dentition.  Neck: normal, supple, no masses, no thyromegaly Respiratory: clear to auscultation bilaterally, no wheezing, no crackles. Normal respiratory effort. No accessory muscle use.  Cardiovascular: Regular rate and rhythm, no murmurs / rubs / gallops. No extremity edema.  Abdomen: no tenderness, no masses palpated. No hepatosplenomegaly. Bowel sounds positive.  Musculoskeletal: no clubbing / cyanosis. No joint deformity upper and  lower extremities. Good ROM, no contractures. Normal muscle tone.  Skin: no rashes, lesions, ulcers. No induration Neurologic: CN 2-12 grossly intact. Strength 5/5 in all 4.  Psychiatric: Normal judgment and insight. Alert and oriented x 3. Normal mood.   Labs on Admission: I have personally reviewed following labs and imaging studies  CBC: Recent Labs  Lab 10/25/17 1042  WBC 8.1  HGB 7.1*  HCT 22.9*  MCV 93.5  PLT 169   Basic Metabolic Panel: Recent Labs  Lab 10/25/17 1042  NA 139  K 4.7  CL 108  CO2 22  GLUCOSE 156*  BUN 77*  CREATININE 2.70*  CALCIUM 9.3   GFR: CrCl cannot be calculated (Unknown ideal weight.). Liver Function Tests: Recent Labs  Lab 10/25/17 1042  AST 22  ALT 13*  ALKPHOS 53  BILITOT 0.2*  PROT 8.6*  ALBUMIN 4.5   No results for input(s): LIPASE, AMYLASE in the last 168 hours. No results for input(s): AMMONIA in the last 168 hours. Coagulation Profile: No results for input(s): INR, PROTIME in the last 168 hours. Cardiac Enzymes: No results for input(s): CKTOTAL, CKMB, CKMBINDEX, TROPONINI in the last 168 hours. BNP (last 3 results) No results for  input(s): PROBNP in the last 8760 hours. HbA1C: No results for input(s): HGBA1C in the last 72 hours. CBG: No results for input(s): GLUCAP in the last 168 hours. Lipid Profile: No results for input(s): CHOL, HDL, LDLCALC, TRIG, CHOLHDL, LDLDIRECT in the last 72 hours. Thyroid Function Tests: No results for input(s): TSH, T4TOTAL, FREET4, T3FREE, THYROIDAB in the last 72 hours. Anemia Panel: No results for input(s): VITAMINB12, FOLATE, FERRITIN, TIBC, IRON, RETICCTPCT in the last 72 hours. Urine analysis:    Component Value Date/Time   COLORURINE YELLOW 08/03/2017 1003   APPEARANCEUR HAZY (A) 08/03/2017 1003   LABSPEC 1.019 08/03/2017 1003   PHURINE 5.0 08/03/2017 1003   GLUCOSEU NEGATIVE 08/03/2017 1003   HGBUR NEGATIVE 08/03/2017 1003   HGBUR negative 02/28/2008 0000   BILIRUBINUR NEGATIVE 08/03/2017 1003   KETONESUR 5 (A) 08/03/2017 1003   PROTEINUR NEGATIVE 08/03/2017 1003   UROBILINOGEN 1.0 07/19/2010 1752   NITRITE NEGATIVE 08/03/2017 1003   LEUKOCYTESUR NEGATIVE 08/03/2017 1003   Sepsis Labs: !!!!!!!!!!!!!!!!!!!!!!!!!!!!!!!!!!!!!!!!!!!! @LABRCNTIP (procalcitonin:4,lacticidven:4) )No results found for this or any previous visit (from the past 240 hour(s)).   Radiological Exams on Admission: No results found.  Assessment/Plan Principal Problem:   Symptomatic anemia Active Problems:   Hypothyroidism   Chronic diastolic CHF (congestive heart failure) (HCC)   Nonischemic cardiomyopathy (HCC)   GI bleed   Atrial fibrillation, chronic (HCC)   Symptomatic anemia and GI bleed -Positive FOBT with Hgb 7.1 -GI consulted -Hold eliquis -Transfuse 2u pRBC  -Trend H&H   AKI on CKD stage III -Baseline Cr ~ 1.7-1.9 -Will hold off on IVF given 2u pRBC transfusion and hx CHF  -Monitor BMP and avoid nephrotoxin  Chronic diastolic heart failure -Echo EF 60-65% with grade 2 dd -Not in exacerbation at this time  -Hold lasix, spironolactone until AKI stabilized  Chronic  atrial fibrillation status post DCCV -Currently RRR  -Hold eliquis   -Hold amiodarone, coreg while NPO   CAD -Hold bidil while NPO   Hypothyroidism -Hold synthroid while NPO   Depression -Hold zoloft while NPO    DVT prophylaxis: SCD Code Status: Full  Family Communication: No family at bedside Disposition Plan: Pending further GI work up  [employer] called: [facility] called by EDP  Admission status: Inpatient    Severity of Illness:  The appropriate patient status for this patient is INPATIENT. Inpatient status is judged to be reasonable and necessary in order to provide the required intensity of service to ensure the patient's safety. The patient's presenting symptoms, physical exam findings, and initial radiographic and laboratory data in the context of their chronic comorbidities is felt to place them at high risk for further clinical deterioration. Furthermore, it is not anticipated that the patient will be medically stable for discharge from the hospital within 2 midnights of admission.   Dessa Phi, DO [facility] [email or web address] Password [email or web address] 10/25/2017, 12:12 PM

## 2017-10-25 NOTE — ED Notes (Signed)
Writer called to to give report. RN with a pt at this time and will call back.

## 2017-10-25 NOTE — Telephone Encounter (Signed)
Per Dr. [name] she would highly recommend pt be evaluated in ED as soon as possible. Spoke with pt and explained situation and urgency. She voiced understanding. She will go to ED now.  Appts canceled.   Dr. [name] - FYI. Thanks!

## 2017-10-26 ENCOUNTER — Inpatient Hospital Stay (HOSPITAL_COMMUNITY): Payer: Medicare Other | Admitting: Anesthesiology

## 2017-10-26 ENCOUNTER — Encounter (HOSPITAL_COMMUNITY): Payer: Self-pay

## 2017-10-26 ENCOUNTER — Encounter (HOSPITAL_COMMUNITY)
Admission: EM | Disposition: A | Discharge status: Home / Self Care | Payer: Self-pay | Source: Home / Self Care | Attending: Internal Medicine

## 2017-10-26 HISTORY — PX: ESOPHAGOGASTRODUODENOSCOPY: SHX5428

## 2017-10-26 HISTORY — PX: HOT HEMOSTASIS: SHX5433

## 2017-10-26 LAB — TYPE AND SCREEN
ABO/RH(D): O POS
ANTIBODY SCREEN: NEGATIVE
UNIT DIVISION: 0
UNIT DIVISION: 0

## 2017-10-26 LAB — BASIC METABOLIC PANEL
Anion gap: 11 (ref 5–15)
BUN: 52 mg/dL — AB (ref 6–20)
CALCIUM: 9.3 mg/dL (ref 8.9–10.3)
CHLORIDE: 112 mmol/L — AB (ref 101–111)
CO2: 18 mmol/L — AB (ref 22–32)
CREATININE: 2.11 mg/dL — AB (ref 0.44–1.00)
GFR calc Af Amer: 26 mL/min — ABNORMAL LOW (ref >60)
GFR calc non Af Amer: 23 mL/min — ABNORMAL LOW (ref >60)
GLUCOSE: 109 mg/dL — AB (ref 65–99)
Potassium: 4.6 mmol/L (ref 3.5–5.1)
Sodium: 141 mmol/L (ref 135–145)

## 2017-10-26 LAB — CBC
HEMATOCRIT: 30.6 % — AB (ref 36.0–46.0)
HEMOGLOBIN: 10.1 g/dL — AB (ref 12.0–15.0)
MCH: 29.2 pg (ref 26.0–34.0)
MCHC: 33 g/dL (ref 30.0–36.0)
MCV: 88.4 fL (ref 78.0–100.0)
Platelets: 214 10*3/uL (ref 150–400)
RBC: 3.46 MIL/uL — ABNORMAL LOW (ref 3.87–5.11)
RDW: 16.6 % — AB (ref 11.5–15.5)
WBC: 9.8 10*3/uL (ref 4.0–10.5)

## 2017-10-26 LAB — BPAM RBC
Blood Product Expiration Date: 201907142359
Blood Product Expiration Date: 201907142359
ISSUE DATE / TIME: 201906201558
ISSUE DATE / TIME: 201906202240
Unit Type and Rh: 5100
Unit Type and Rh: 5100

## 2017-10-26 LAB — HIV ANTIBODY (ROUTINE TESTING W REFLEX): HIV SCREEN 4TH GENERATION: NONREACTIVE

## 2017-10-26 SURGERY — EGD (ESOPHAGOGASTRODUODENOSCOPY)
Anesthesia: Monitor Anesthesia Care

## 2017-10-26 MED ORDER — LACTATED RINGERS IV SOLN
INTRAVENOUS | Status: DC | PRN
  Administered 2017-10-26: 13:00:00 via INTRAVENOUS

## 2017-10-26 MED ORDER — PANTOPRAZOLE SODIUM 40 MG PO TBEC
40.0000 mg | DELAYED_RELEASE_TABLET | Freq: Every day | ORAL | Status: DC
  Filled 2017-10-26: qty 1

## 2017-10-26 MED ORDER — PROPOFOL 10 MG/ML IV BOLUS
INTRAVENOUS | Status: DC | PRN
  Administered 2017-10-26: 60 mg via INTRAVENOUS

## 2017-10-26 MED ORDER — PROPOFOL 500 MG/50ML IV EMUL
INTRAVENOUS | Status: DC | PRN
  Administered 2017-10-26: 100 ug/kg/min via INTRAVENOUS

## 2017-10-26 MED ORDER — LIDOCAINE 2% (20 MG/ML) 5 ML SYRINGE
INTRAMUSCULAR | Status: DC | PRN
  Administered 2017-10-26: 100 mg via INTRAVENOUS

## 2017-10-26 MED ORDER — PANTOPRAZOLE SODIUM 40 MG PO TBEC: 40.0000 mg | DELAYED_RELEASE_TABLET | Freq: Every day | ORAL | 0 refills | Status: DC

## 2017-10-26 MED ORDER — PROPOFOL 10 MG/ML IV BOLUS
INTRAVENOUS | Status: AC
  Filled 2017-10-26: qty 40

## 2017-10-26 NOTE — Progress Notes (Deleted)
PROGRESS NOTE    Morgan Clay  QPY:[id number] DOB: 09/27/1945 DOA: 10/25/2017 PCP: [name], DO  Brief Narrative: 72 y.o. female with medical history significant of NICM (last EF 45%) s/pCRT-D, A. fib on Eliquis with recent DCCV on 07/24/17, OSA on CPAP, HCV status post Harvoni, hypothyroidism, depression and diet managed type 2 diabetes mellitus who was recently admitted to the hospital in April 2019 due to acute diverticulitis.  Her last colonoscopy was July 09, 2009 which found a polyp and diverticulosis.  She states that since her discharge from the hospital in April 2019, she has not felt back to her baseline. She had her labs checked by her primary care physician, referred to the emergency department due to hemoglobin 7.0. She admits to generalized fatigue, lightheadedness and shortness of breath.  She also admits to black stools.  No bright red blood per rectum, no nausea or vomiting or abdominal pain.  She does take Eliquis on daily basis, no other NSAID use reported.  She denies any other blood loss, no vaginal bleeding or hematemesis.  Chest pain or cough, diarrhea.    ED Course: Labs revealed hemoglobin 7.1 and positive fecal occult blood.  [facility] was consulted and patient referred for admission    Assessment & Plan:   Principal Problem:   Symptomatic anemia Active Problems:   Hypothyroidism   Chronic diastolic CHF (congestive heart failure) (HCC)   Nonischemic cardiomyopathy (HCC)   GI bleed   Atrial fibrillation, chronic (HCC)  1] symptomatic anemia/melena status post 2 units of blood transfusion admitted with a hemoglobin of 7.1 hemoglobin up to 10.0.  Status post EGD by Dr. Fuller Plan today showed 1 benign appearing intrinsic mild stenosis at the gastroesophageal junction measuring 1.6 cm in diameter.  Esophagus otherwise normal.  4 mm single nonbleeding angiodysplastic lesion in the greater curvature of the stomach status post argon plasma coagulation.  He was  instructed not to take aspirin, Motrin, ibuprofen, naproxen or other nonsteroidal anti-inflammatory drugs.  Eliquis can be resumed 10/30/2017.  Take Protonix 40 mg daily for a month.  Follow-up with Dr. Fuller Plan GI.  2] AKI on CKD stage III improved with blood transfusion.  3] chronic diastolic CHF stable.  4] chronic atrial fibrillation status post DC cardioversion restart Eliquis 10/30/2017.  5] hypothyroidism continue Synthroid.    DVT prophylaxis: Code StatusFamily CommunicationDisposition Plan: (specify when and where you expect patient to be discharged). Include barriers to DC in this tab.   Consultants:     Procedures  Subjective:   Objective: Vitals:   10/26/17 1256 10/26/17 1358 10/26/17 1412 10/26/17 1437  BP: (!) 228/56 (!) 167/66 (!) 185/68 (!) 197/64  Pulse: 68 64 60 68  Resp: (!) 28 (!) 25 19 17   Temp: 97.7 F (36.5 C) 97.6 F (36.4 C)  98.5 F (36.9 C)  TempSrc: Oral Oral  Oral  SpO2: 96% 98% 100% 100%  Weight:      Height:        Intake/Output Summary (Last 24 hours) at 10/26/2017 1456 Last data filed at 10/26/2017 1400 Gross per 24 hour  Intake 1296 ml  Output -  Net 1296 ml   Filed Weights   10/25/17 1422  Weight: 65.2 kg (143 lb 11.8 oz)    Examination:  General exam: Appears calm and comfortable  Respiratory system: Clear to auscultation. Respiratory effort normal. Cardiovascular system: S1 & S2 heard, RRR. No JVD, murmurs, rubs, gallops or clicks. No pedal edema. Gastrointestinal system: Abdomen is nondistended, soft  and nontender. No organomegaly or masses felt. Normal bowel sounds heard. Central nervous system: Alert and oriented. No focal neurological deficits. Extremities: Symmetric 5 x 5 power. Skin: No rashes, lesions or ulcers Psychiatry: Judgement and insight appear normal. Mood & affect appropriate.     Data Reviewed: I have personally reviewed following labs and imaging studies  CBC: Recent Labs  Lab 10/25/17 1042  10/26/17 0535  WBC 8.1 9.8  HGB 7.1* 10.1*  HCT 22.9* 30.6*  MCV 93.5 88.4  PLT 246 960   Basic Metabolic Panel: Recent Labs  Lab 10/25/17 1042 10/26/17 0535  NA 139 141  K 4.7 4.6  CL 108 112*  CO2 22 18*  GLUCOSE 156* 109*  BUN 77* 52*  CREATININE 2.70* 2.11*  CALCIUM 9.3 9.3   GFR: Estimated Creatinine Clearance: 20.7 mL/min (A) (by C-G formula based on SCr of 2.11 mg/dL (H)). Liver Function Tests: Recent Labs  Lab 10/25/17 1042  AST 22  ALT 13*  ALKPHOS 53  BILITOT 0.2*  PROT 8.6*  ALBUMIN 4.5   No results for input(s): LIPASE, AMYLASE in the last 168 hours. No results for input(s): AMMONIA in the last 168 hours. Coagulation Profile: Recent Labs  Lab 10/25/17 1430  INR 1.34   Cardiac Enzymes: No results for input(s): CKTOTAL, CKMB, CKMBINDEX, TROPONINI in the last 168 hours. BNP (last 3 results) No results for input(s): PROBNP in the last 8760 hours. HbA1C: No results for input(s): HGBA1C in the last 72 hours. CBG: No results for input(s): GLUCAP in the last 168 hours. Lipid Profile: No results for input(s): CHOL, HDL, LDLCALC, TRIG, CHOLHDL, LDLDIRECT in the last 72 hours. Thyroid Function Tests: No results for input(s): TSH, T4TOTAL, FREET4, T3FREE, THYROIDAB in the last 72 hours. Anemia Panel: No results for input(s): VITAMINB12, FOLATE, FERRITIN, TIBC, IRON, RETICCTPCT in the last 72 hours. Sepsis Labs: No results for input(s): PROCALCITON, LATICACIDVEN in the last 168 hours.  No results found for this or any previous visit (from the past 240 hour(s)).       Radiology Studies: No results found.      Scheduled Meds: . sodium chloride   Intravenous Once  . pantoprazole  40 mg Oral Daily   Continuous Infusions:   LOS: 1 day     Georgette Shell, MD  If 7PM-7AM, please contact night-coverage [email or web address] Password [facility] 10/26/2017, 2:56 PM

## 2017-10-26 NOTE — Discharge Summary (Signed)
Physician Discharge Summary  Morgan Clay FYB:[id number] DOB: 05/30/1945 DOA: 10/25/2017  PCP: [name], DO  Admit date: 10/25/2017 Discharge date: 10/26/2017  Admitted From: Home Disposition: Home  Recommendations for Outpatient Follow-up:  1. Follow up with PCP in 1-2 weeks 2. Please obtain BMP/CBC in one week 3. Follow-up with GI Dr. Fuller Plan 4. Home health none  Equipment/Devices: None Discharge Condition stable CODE STATUS full code Diet recommendation: Cardiac diet  Brief/Interim Summary:71 y.o.femalewith medical history significant ofNICM (last EF 45%) s/pCRT-D, A. fib on Eliquis with recent DCCV on 07/24/17, OSA on CPAP, HCV status post Harvoni, hypothyroidism, depression and diet managed type 2 diabetes mellituswho wasrecently admitted to the hospitalin April 2019due to acute diverticulitis.Her last colonoscopy was July 09, 2009 which found a polyp and diverticulosis. She states that since her discharge from the hospital in April 2019, she has not felt back to her baseline. She had her labs checked by her primary care physician, referred to the emergency department due to hemoglobin 7.0. She admits to generalized fatigue, lightheadedness and shortness of breath. She also admits to black stools. No bright red blood per rectum, no nausea or vomiting or abdominal pain. She does take Eliquis on daily basis, no other NSAID use reported. She denies any other blood loss, no vaginal bleeding or hematemesis. Chest pain or cough, diarrhea.   ED Course:Labs revealed hemoglobin 7.1andpositive fecal occult blood.[facility]      Discharge Diagnoses:  Principal Problem:   Symptomatic anemia Active Problems:   Hypothyroidism   Chronic diastolic CHF (congestive heart failure) (HCC)   Nonischemic cardiomyopathy (HCC)   GI bleed   Atrial fibrillation, chronic (HCC)  1] symptomatic anemia/melena status post 2 units  of blood transfusion admitted with a hemoglobin of 7.1 hemoglobin up to 10.0.  Status post EGD by Dr. Fuller Plan today showed 1 benign appearing intrinsic mild stenosis at the gastroesophageal junction measuring 1.6 cm in diameter.  Esophagus otherwise normal.  4 mm single nonbleeding angiodysplastic lesion in the greater curvature of the stomach status post argon plasma coagulation.  He was instructed not to take aspirin, Motrin, ibuprofen, naproxen or other nonsteroidal anti-inflammatory drugs.  Eliquis can be resumed 10/30/2017.  Take Protonix 40 mg daily for a month.  Follow-up with Dr. Fuller Plan GI.  2] AKI on CKD stage III improved with blood transfusion.  3] chronic diastolic CHF stable.  4] chronic atrial fibrillation status post DC cardioversion restart Eliquis 10/30/2017.  5] hypothyroidism continue Synthroid.     Discharge Instructions  Discharge Instructions    Call MD for:  difficulty breathing, headache or visual disturbances   Complete by:  As directed    Call MD for:  hives   Complete by:  As directed    Call MD for:  persistant dizziness or light-headedness   Complete by:  As directed    Call MD for:  persistant nausea and vomiting   Complete by:  As directed    Call MD for:  redness, tenderness, or signs of infection (pain, swelling, redness, odor or green/yellow discharge around incision site)   Complete by:  As directed    Call MD for:  severe uncontrolled pain   Complete by:  As directed    Call MD for:  temperature >100.4   Complete by:  As directed    Diet - low sodium heart healthy   Complete by:  As directed    Discharge instructions   Complete by:  As  directed    Do not take aspirin, Motrin, ibuprofen, naproxen.  Restart Eliquis 10/30/2017.  Take Protonix 40 mg daily for 1 month.  Follow-up with [name] with GI.   Increase activity slowly   Complete by:  As directed      Allergies as of 10/26/2017   No Known Allergies     Medication List    TAKE  these medications   albuterol 108 (90 Base) MCG/ACT inhaler Commonly known as:  PROVENTIL HFA;VENTOLIN HFA Inhale 2 puffs into the lungs every 6 (six) hours as needed for wheezing or shortness of breath.   amiodarone 200 MG tablet Commonly known as:  PACERONE Take 200 mg by mouth at bedtime.   BIDIL 20-37.5 MG tablet Generic drug:  isosorbide-hydrALAZINE TAKE 1 TABLET BY MOUTH TWICE DAILY   carvedilol 12.5 MG tablet Commonly known as:  COREG Take 1 tablet (12.5 mg total) by mouth 2 (two) times daily with a meal.   ELIQUIS 5 MG Tabs tablet Generic drug:  apixaban TAKE ONE TABLET BY MOUTH TWICE DAILY   furosemide 40 MG tablet Commonly known as:  LASIX Take 1 tablet (40 mg total) by mouth 2 (two) times daily.   Gerhardt's butt cream Crea Apply 1 application topically 2 (two) times daily.   levothyroxine 125 MCG tablet Commonly known as:  SYNTHROID, LEVOTHROID Take 125 mcg by mouth daily before breakfast.   nitroGLYCERIN 0.4 MG SL tablet Commonly known as:  NITROSTAT Place 0.4 mg under the tongue every 5 (five) minutes as needed for chest pain.   pantoprazole 40 MG tablet Commonly known as:  PROTONIX Take 1 tablet (40 mg total) by mouth daily. What changed:    medication strength  how much to take   sertraline 50 MG tablet Commonly known as:  ZOLOFT Take 1 tablet (50 mg total) by mouth daily. What changed:  when to take this   spironolactone 25 MG tablet Commonly known as:  ALDACTONE Take 1 tablet (25 mg total) daily by mouth. What changed:  when to take this   tiZANidine 2 MG tablet Commonly known as:  ZANAFLEX Take 1 tablet (2 mg total) by mouth at bedtime as needed for muscle spasms.   traMADol 50 MG tablet Commonly known as:  ULTRAM Take 2 tablets (100 mg total) by mouth every 12 (twelve) hours as needed (mild pain. use Tylenol first).   VISINE OP Place 1 drop into both eyes daily as needed (DRY EYES).      Follow-up Information    [name],  DO Follow up.   Specialty:  Family Medicine Contact information: [place] [address] [phone]        [name], MD .   Specialty:  Cardiology Contact information: [phone] [address] [phone]        [name], MD Follow up.   Specialty:  Gastroenterology Contact information: [address] [address] [phone]          No Known Allergies  Consultations:  GI   Procedures/Studies: Dg Chest 2 View  Result Date: 10/05/2017 CLINICAL DATA:  Pneumonia. EXAM: CHEST - 2 VIEW COMPARISON:  Radiographs of Sep 30, 2017. FINDINGS: Stable cardiomediastinal silhouette. Left-sided pacemaker is unchanged in position. No pneumothorax is noted. Right basilar opacity noted on prior exam is nearly resolved with residual density noted which may represent postinflammatory scarring. Left lung is unremarkable. Bony thorax is unremarkable. IMPRESSION: Significantly decreased  right basilar opacity is noted consistent with improved pneumonia, with some degree of residual inflammation present or possibly postinflammatory scarring. Electronically Signed   By: Marijo Conception, M.D.   On: 10/05/2017 09:48    (Echo, Carotid, EGD, Colonoscopy, ERCP)    Subjective:   Discharge Exam: Vitals:   10/26/17 1412 10/26/17 1437  BP: (!) 185/68 (!) 197/64  Pulse: 60 68  Resp: 19 17  Temp:  98.5 F (36.9 C)  SpO2: 100% 100%   Vitals:   10/26/17 1256 10/26/17 1358 10/26/17 1412 10/26/17 1437  BP: (!) 228/56 (!) 167/66 (!) 185/68 (!) 197/64  Pulse: 68 64 60 68  Resp: (!) 28 (!) 25 19 17   Temp: 97.7 F (36.5 C) 97.6 F (36.4 C)  98.5 F (36.9 C)  TempSrc: Oral Oral  Oral  SpO2: 96% 98% 100% 100%  Weight:      Height:        General: Pt is alert, awake, not in acute distress Cardiovascular: RRR, S1/S2 +, no rubs, no gallops Respiratory: CTA bilaterally, no wheezing, no rhonchi Abdominal: Soft,  NT, ND, bowel sounds + Extremities: no edema, no cyanosis    The results of significant diagnostics from this hospitalization (including imaging, microbiology, ancillary and laboratory) are listed below for reference.     Microbiology: No results found for this or any previous visit (from the past 240 hour(s)).   Labs: BNP (last 3 results) Recent Labs    02/22/17 2126 07/31/17 1229  BNP 1,275.8* 474.2*   Basic Metabolic Panel: Recent Labs  Lab 10/25/17 1042 10/26/17 0535  NA 139 141  K 4.7 4.6  CL 108 112*  CO2 22 18*  GLUCOSE 156* 109*  BUN 77* 52*  CREATININE 2.70* 2.11*  CALCIUM 9.3 9.3   Liver Function Tests: Recent Labs  Lab 10/25/17 1042  AST 22  ALT 13*  ALKPHOS 53  BILITOT 0.2*  PROT 8.6*  ALBUMIN 4.5   No results for input(s): LIPASE, AMYLASE in the last 168 hours. No results for input(s): AMMONIA in the last 168 hours. CBC: Recent Labs  Lab 10/25/17 1042 10/26/17 0535  WBC 8.1 9.8  HGB 7.1* 10.1*  HCT 22.9* 30.6*  MCV 93.5 88.4  PLT 246 214   Cardiac Enzymes: No results for input(s): CKTOTAL, CKMB, CKMBINDEX, TROPONINI in the last 168 hours. BNP: Invalid input(s): POCBNP CBG: No results for input(s): GLUCAP in the last 168 hours. D-Dimer No results for input(s): DDIMER in the last 72 hours. Hgb A1c No results for input(s): HGBA1C in the last 72 hours. Lipid Profile No results for input(s): CHOL, HDL, LDLCALC, TRIG, CHOLHDL, LDLDIRECT in the last 72 hours. Thyroid function studies No results for input(s): TSH, T4TOTAL, T3FREE, THYROIDAB in the last 72 hours.  Invalid input(s): FREET3 Anemia work up No results for input(s): VITAMINB12, FOLATE, FERRITIN, TIBC, IRON, RETICCTPCT in the last 72 hours. Urinalysis    Component Value Date/Time   COLORURINE YELLOW 08/03/2017 1003   APPEARANCEUR HAZY (A) 08/03/2017 1003   LABSPEC 1.019 08/03/2017 1003   PHURINE 5.0 08/03/2017 1003   GLUCOSEU NEGATIVE 08/03/2017 1003   HGBUR NEGATIVE  08/03/2017 1003   HGBUR negative 02/28/2008 0000   BILIRUBINUR NEGATIVE 08/03/2017 1003   KETONESUR 5 (A) 08/03/2017 1003   PROTEINUR NEGATIVE 08/03/2017 1003   UROBILINOGEN 1.0 07/19/2010 1752   NITRITE NEGATIVE 08/03/2017 1003   LEUKOCYTESUR NEGATIVE 08/03/2017 1003   Sepsis Labs Invalid input(s): PROCALCITONIN,  WBC,  LACTICIDVEN Microbiology No results found for  this or any previous visit (from the past 240 hour(s)).   Time coordinating discharge: 36 minutes  SIGNED:   Georgette Shell, MD  [facility] 10/26/2017, 3:00 PM Pager   If 7PM-7AM, please contact night-coverage [email or web address] Password [email or web address]

## 2017-10-26 NOTE — Anesthesia Preprocedure Evaluation (Signed)
Anesthesia Evaluation  Patient identified by MRN, date of birth, ID band Patient awake    Reviewed: Allergy & Precautions, NPO status , Patient's Chart, lab work & pertinent test results, reviewed documented beta blocker date and time   Airway Mallampati: II  TM Distance: >3 FB Neck ROM: Full    Dental  (+) Lower Dentures, Partial Upper, Caps   Pulmonary asthma , sleep apnea and Continuous Positive Airway Pressure Ventilation , COPD,  COPD inhaler,    Pulmonary exam normal breath sounds clear to auscultation + decreased breath sounds      Cardiovascular hypertension, Pt. on medications and Pt. on home beta blockers +CHF  Normal cardiovascular exam+ dysrhythmias Atrial Fibrillation + Cardiac Defibrillator  Rhythm:Regular Rate:Normal  Non ischemic CM LVEF   Neuro/Psych PSYCHIATRIC DISORDERS Depression negative neurological ROS     GI/Hepatic negative GI ROS, Neg liver ROS, GERD  Medicated,  Endo/Other  diabetes, Well Controlled, Type 2Hypothyroidism Obesity  Renal/GU Renal InsufficiencyRenal disease  negative genitourinary   Musculoskeletal negative musculoskeletal ROS (+) Arthritis , Osteoarthritis,    Abdominal (+) - obese,   Peds  Hematology  (+) Blood dyscrasia, anemia , Eliquis- last dose   Anesthesia Other Findings [facility]  ECHO COMPLETE WO IMAGING ENHANCING AGENT  Order# [id number]  Reading physician: Thayer Headings, MD Ordering physician: Thompson Grayer, MD Study date: 08/11/17 Study Result   Result status: Final result                             *[facility] [employer].                        [place], [place] [address]                            [phone]  ------------------------------------------------------------------- Transthoracic Echocardiography  Patient:    Morgan Clay, Morgan Clay MR #:        [id number] Study Date: 08/11/2017 Gender:     F Age:        72 Height:     149.9 cm Weight:     81.2 kg BSA:        1.88 m^2 Pt. Status: Room:       [place], MD  REFERRING    Thompson Grayer, MD  PERFORMING   [facility], Inpatient  ATTENDING    Duffy Bruce  SONOGRAPHER  Dance, Tiffany  ADMITTING    Jonnie Finner, Michele Mcalpine  cc:  ------------------------------------------------------------------- LV EF: 60% -   65%     Reproductive/Obstetrics                             Anesthesia Physical  Anesthesia Plan  ASA: III  Anesthesia Plan: MAC   Post-op Pain Management:    Induction:   PONV Risk Score and Plan: 3  Airway Management Planned: Natural Airway and Nasal Cannula  Additional Equipment:   Intra-op Plan:   Post-operative Plan:   Informed Consent: I have reviewed the patients History and Physical, chart, labs  and discussed the procedure including the risks, benefits and alternatives for the proposed anesthesia with the patient or authorized representative who has indicated his/her understanding and acceptance.     Plan Discussed with: CRNA  Anesthesia Plan Comments:         Anesthesia Quick Evaluation

## 2017-10-26 NOTE — Discharge Instructions (Signed)
DO NOT TAKE MOTRIN ADVIL ASPIRIN .START ELIQUIS 10/30/2017.

## 2017-10-26 NOTE — Op Note (Addendum)
[facility] Patient Name: Morgan Clay Procedure Date: 10/26/2017 MRN: [id number] Attending MD: Morgan Clay , MD Date of Birth: 27-Sep-1945 CSN: [id number] Age: 72 Admit Type: Inpatient Procedure:                Upper GI endoscopy Indications:              Melena Providers:                Pricilla Riffle. Fuller Plan, MD, Presley Raddle, RN, Tinnie Gens, Technician, Stephanie [place] CRNA Referring MD:             [facility] Medicines:                Monitored Anesthesia Care Complications:            No immediate complications. Estimated Blood Loss:     Estimated blood loss: none. Procedure:                Pre-Anesthesia Assessment:                           - Prior to the procedure, a History and Physical                            was performed, and patient medications and                            allergies were reviewed. The patient's tolerance of                            previous anesthesia was also reviewed. The risks                            and benefits of the procedure and the sedation                            options and risks were discussed with the patient.                            All questions were answered, and informed consent                            was obtained. Prior Anticoagulants: The patient has                            taken Eliquis (apixaban), last dose was 2 days                            prior to procedure. ASA Grade Assessment: III - A                            patient with severe systemic disease. After  reviewing the risks and benefits, the patient was                            deemed in satisfactory condition to undergo the                            procedure.                           After obtaining informed consent, the endoscope was                            passed under direct vision. Throughout the                            procedure, the patient's blood  pressure, pulse, and                            oxygen saturations were monitored continuously. The                            [id number]) scope was introduced through the                            mouth, and advanced to the second part of duodenum.                            The upper GI endoscopy was accomplished without                            difficulty. The patient tolerated the procedure                            well. Scope In: Scope Out: Findings:      One benign-appearing, intrinsic mild stenosis was found at the       gastroesophageal junction. This stenosis measured 1.6 cm (inner       diameter). The stenosis was traversed.      The exam of the esophagus was otherwise normal.      A single 4 mm no bleeding angiodysplastic lesion was found on the       greater curvature of the stomach. Coagulation for bleeding prevention       using argon plasma was successful.      The exam of the stomach was otherwise normal.      The duodenal bulb and second portion of the duodenum were normal. Impression:               - Benign-appearing esophageal stenosis.                           - A single non-bleeding angiodysplastic lesion in                            the stomach. Treated with argon plasma coagulation                            (  APC).                           - Normal duodenal bulb and second portion of the                            duodenum.                           - No specimens collected. Moderate Sedation:      N/A- Per Anesthesia Care Recommendation:           - Return patient to hospital ward for ongoing care.                           - Resume previous diet.                           - Continue present medications.                           - Protonix 40 mg po qam for 1 month.                           - No aspirin, ibuprofen, naproxen, or other                            non-steroidal anti-inflammatory drugs.                           - Resume Eliquis  (apixaban) at prior dose at least                            3 days from now. Refer to managing physician for                            further adjustment of therapy.                           - OK for discharge later today from a GI standpoint.                           - Outpatient GI follow up with Dr. Silvano Rusk Procedure Code(s):        --- Professional ---                           [phone], Esophagogastroduodenoscopy, flexible,                            transoral; with control of bleeding, any method Diagnosis Code(s):        --- Professional ---                           K22.2, Esophageal obstruction                           K31.819,  Angiodysplasia of stomach and duodenum                            without bleeding                           K92.1, Melena (includes Hematochezia) CPT copyright 2017 American Medical Association. All rights reserved. The codes documented in this report are preliminary and upon coder review may  be revised to meet current compliance requirements. Morgan Artist, MD 10/26/2017 1:59:54 PM This report has been signed electronically. Number of Addenda: 0

## 2017-10-26 NOTE — Transfer of Care (Signed)
Immediate Anesthesia Transfer of Care Note  Patient: Morgan Clay  Procedure(s) Performed: ESOPHAGOGASTRODUODENOSCOPY (EGD) (N/A )  Patient Location: PACU and Endoscopy Unit  Anesthesia Type:MAC  Level of Consciousness: awake, alert  and oriented  Airway & Oxygen Therapy: Patient Spontanous Breathing and Patient connected to nasal cannula oxygen  Post-op Assessment: Report given to RN and Post -op Vital signs reviewed and stable  Post vital signs: Reviewed and stable  Last Vitals:  Vitals Value Taken Time  BP    Temp    Pulse    Resp    SpO2      Last Pain:  Vitals:   10/26/17 1358  TempSrc: (P) Oral  PainSc:          Complications: No apparent anesthesia complications

## 2017-10-26 NOTE — Interval H&P Note (Signed)
History and Physical Interval Note:  10/26/2017 1:28 PM  Morgan Clay  has presented today for surgery, with the diagnosis of melena, anemia  The various methods of treatment have been discussed with the patient and family. After consideration of risks, benefits and other options for treatment, the patient has consented to  Procedure(s): ESOPHAGOGASTRODUODENOSCOPY (EGD) (N/A) as a surgical intervention .  The patient's history has been reviewed, patient examined, no change in status, stable for surgery.  I have reviewed the patient's chart and labs.  Questions were answered to the patient's satisfaction.     Pricilla Riffle. Fuller Plan

## 2017-10-26 NOTE — Anesthesia Postprocedure Evaluation (Signed)
Anesthesia Post Note  Patient: Morgan Clay  Procedure(s) Performed: ESOPHAGOGASTRODUODENOSCOPY (EGD) (N/A )     Patient location during evaluation: Endoscopy Anesthesia Type: MAC Level of consciousness: awake Pain management: pain level controlled Vital Signs Assessment: post-procedure vital signs reviewed and stable Respiratory status: spontaneous breathing Cardiovascular status: stable Postop Assessment: no apparent nausea or vomiting Anesthetic complications: no    Last Vitals:  Vitals:   10/26/17 1256 10/26/17 1358  BP: (!) 228/56 (!) 167/66  Pulse: 68 64  Resp: (!) 28 (!) 25  Temp: 36.5 C 36.4 C  SpO2: 96% 98%    Last Pain:  Vitals:   10/26/17 1358  TempSrc: Oral  PainSc: 0-No pain   Pain Goal:                 [name],JOHN Teigan Manner

## 2017-10-29 ENCOUNTER — Telehealth: Payer: Self-pay | Admitting: *Deleted

## 2017-10-29 ENCOUNTER — Encounter (HOSPITAL_COMMUNITY): Payer: Self-pay | Admitting: Gastroenterology

## 2017-10-29 NOTE — Telephone Encounter (Signed)
Transition Care Management Follow-up Telephone Call  Per Discharge Summary: Admit date: 10/25/2017 Discharge date: 10/26/2017  Admitted From: Home Disposition: Home  Recommendations for Outpatient Follow-up:  1. Follow up with PCP in 1-2 weeks 2. Please obtain BMP/CBC in one week 3. Follow-up with GI Dr. Fuller Plan 4. Home health none  Equipment/Devices: None Discharge Condition stable CODE STATUS full code Diet recommendation: Cardiac diet  --   How have you been since you were released from the hospital? "Okay."   Do you understand why you were in the hospital? yes   Do you understand the discharge instructions? yes   Where were you discharged to? Home   Items Reviewed:  Medications reviewed: yes  Allergies reviewed: yes  Dietary changes reviewed: yes  Referrals reviewed: yes   Functional Questionnaire:   Activities of Daily Living (ADLs):   She states they are independent in the following: ambulation, bathing and hygiene, feeding, continence, grooming, toileting and dressing States they require assistance with the following: none   Any transportation issues/concerns?: no   Any patient concerns? no   Confirmed importance and date/time of follow-up visits scheduled yes  Provider Appointment booked with Dr. Colin Benton 11/06/17 @ 1:45 pm  Confirmed with patient if condition begins to worsen call PCP or go to the ER.  Patient was given the office number and encouraged to call back with question or concerns.  : yes

## 2017-10-30 ENCOUNTER — Telehealth: Payer: Self-pay | Admitting: Internal Medicine

## 2017-10-30 NOTE — Telephone Encounter (Signed)
Spoke with pt today. She denies SOB or swelling any where on her body. During her last OV, her weight was 141lbs. Today she is 143. She was recently hospitalized where she received PRBCs and fluid. She states they held her lasix while hospitalized. She states she began her lasix again upon discharge. I advised her that with her RF, she could also consult her nephrologist for fluid management and kidney preservation. She agrees with plan and had no additional questions.

## 2017-10-30 NOTE — Telephone Encounter (Signed)
New message   Patient calling to schedule f/u appt from hospital. Also stating she has had weight gain in 1 week.   Pt c/o swelling: STAT is pt has developed SOB within 24 hours  1) How much weight have you gained and in what time span? 8 lbs in 1 week  2) If swelling, where is the swelling located? Patient states she has no obvious signs of swelling  3) Are you currently taking a fluid pill? YES  4) Are you currently SOB? NO  5) Do you have a log of your daily weights (if so, list)? 143 today  6) Have you gained 3 pounds in a day or 5 pounds in a week?   7) Have you traveled recently? NO

## 2017-11-02 ENCOUNTER — Telehealth: Payer: Self-pay | Admitting: Oncology

## 2017-11-02 ENCOUNTER — Encounter: Payer: Self-pay | Admitting: Oncology

## 2017-11-02 NOTE — Telephone Encounter (Signed)
Referral received from Dr. [name] for the pt to see Dr. Alen Blew on 7/19 at 11:15am. Msg sent to the referring office to notify the pt of the appointment date and time. Letter mailed.

## 2017-11-05 ENCOUNTER — Ambulatory Visit (INDEPENDENT_AMBULATORY_CARE_PROVIDER_SITE_OTHER): Payer: Medicare Other

## 2017-11-05 ENCOUNTER — Ambulatory Visit (INDEPENDENT_AMBULATORY_CARE_PROVIDER_SITE_OTHER): Payer: Medicare Other | Admitting: *Deleted

## 2017-11-05 ENCOUNTER — Telehealth: Payer: Self-pay

## 2017-11-05 DIAGNOSIS — Z95810 Presence of automatic (implantable) cardiac defibrillator: Secondary | ICD-10-CM

## 2017-11-05 DIAGNOSIS — I428 Other cardiomyopathies: Secondary | ICD-10-CM

## 2017-11-05 DIAGNOSIS — I5022 Chronic systolic (congestive) heart failure: Secondary | ICD-10-CM | POA: Diagnosis not present

## 2017-11-05 NOTE — Progress Notes (Signed)
EPIC Encounter for ICM Monitoring  Patient Name: Morgan Clay is a 72 y.o. female Date: 11/05/2017 Primary Care Physican: [name], DO Primary [place] Electrophysiologist: Caryl Comes Nephrologist: [name] Dry Weight: 137lbs (Baseline135-140lbs) Bi-V Pacing: 100%        Attempted call to patient and unable to reach.  Left message to return call.  Transmission reviewed.    Thoracic impedance abnormal suggesting fluid accumulation starting 10/19/2017.  Prescribed dosage: Furosemide 40 mg 1tablet(40 mg total)twice a day.   Labs: 10/26/2017 Creatinine 2.11, BUN 52, Potassium 4.6, Sodium 141 10/25/2017 Creatinine 2.70, BUN 77, Potassium 4.7, Sodium 139  08/30/2017 Creatinine 1.96, BUN 28, Potassium 4.2, Sodium 141, EGFR 32.46  08/17/2017 Creatinine 1.62, BUN 34, Potassium 4.1, Sodium 136  08/16/2017 Creatinine 1.69, BUN 34, Potassium 3.3, Sodium 137  08/15/2017 Creatinine 1.69, BUN 38, Potassium 3.2, Sodium 136  08/14/2017 Creatinine 1.80, BUN 38, Potassium 4.3, Sodium 139  08/12/2017 Creatinine 2.31, BUN 41, Potassium 4.7, Sodium 135  A complete set of results can be found in Results Review.  Recommendations: NONE - Unable to reach.  Follow-up plan: ICM clinic phone appointment on 11/13/2017 to recheck fluid levels.  Office appointment scheduled 12/24/2017 with Lynnell Dike, PA.   Copy of ICM check sent to Dr. Caryl Comes.   3 month ICM trend: 11/05/2017    1 Year ICM trend:       Morgan Billings, RN 11/05/2017 3:31 PM

## 2017-11-05 NOTE — Telephone Encounter (Signed)
Remote ICM transmission received.  Attempted call to patient and left message to return call. 

## 2017-11-05 NOTE — Progress Notes (Signed)
Remote ICD transmission.   

## 2017-11-06 ENCOUNTER — Telehealth: Payer: Self-pay | Admitting: Gastroenterology

## 2017-11-06 ENCOUNTER — Ambulatory Visit (INDEPENDENT_AMBULATORY_CARE_PROVIDER_SITE_OTHER): Payer: Medicare Other | Admitting: Family Medicine

## 2017-11-06 ENCOUNTER — Encounter: Payer: Self-pay | Admitting: Family Medicine

## 2017-11-06 ENCOUNTER — Observation Stay (HOSPITAL_COMMUNITY)
Admission: EM | Admit: 2017-11-06 | Discharge: 2017-11-08 | Disposition: A | Discharge status: Home / Self Care | Payer: Medicare Other | Attending: Internal Medicine | Admitting: Internal Medicine

## 2017-11-06 ENCOUNTER — Encounter (HOSPITAL_COMMUNITY): Payer: Self-pay | Admitting: Emergency Medicine

## 2017-11-06 VITALS — BP 120/58 | HR 61 | Temp 98.4°F | Ht 59.0 in | Wt 143.2 lb

## 2017-11-06 DIAGNOSIS — E039 Hypothyroidism, unspecified: Secondary | ICD-10-CM | POA: Insufficient documentation

## 2017-11-06 DIAGNOSIS — I482 Chronic atrial fibrillation, unspecified: Secondary | ICD-10-CM

## 2017-11-06 DIAGNOSIS — E875 Hyperkalemia: Secondary | ICD-10-CM | POA: Diagnosis not present

## 2017-11-06 DIAGNOSIS — D649 Anemia, unspecified: Secondary | ICD-10-CM | POA: Insufficient documentation

## 2017-11-06 DIAGNOSIS — I10 Essential (primary) hypertension: Secondary | ICD-10-CM | POA: Diagnosis not present

## 2017-11-06 DIAGNOSIS — I5042 Chronic combined systolic (congestive) and diastolic (congestive) heart failure: Secondary | ICD-10-CM | POA: Diagnosis not present

## 2017-11-06 DIAGNOSIS — R5383 Other fatigue: Secondary | ICD-10-CM | POA: Diagnosis not present

## 2017-11-06 DIAGNOSIS — Z95810 Presence of automatic (implantable) cardiac defibrillator: Secondary | ICD-10-CM | POA: Diagnosis present

## 2017-11-06 DIAGNOSIS — I2510 Atherosclerotic heart disease of native coronary artery without angina pectoris: Secondary | ICD-10-CM | POA: Diagnosis not present

## 2017-11-06 DIAGNOSIS — J449 Chronic obstructive pulmonary disease, unspecified: Secondary | ICD-10-CM | POA: Insufficient documentation

## 2017-11-06 DIAGNOSIS — K31819 Angiodysplasia of stomach and duodenum without bleeding: Secondary | ICD-10-CM | POA: Diagnosis not present

## 2017-11-06 DIAGNOSIS — K5730 Diverticulosis of large intestine without perforation or abscess without bleeding: Secondary | ICD-10-CM

## 2017-11-06 DIAGNOSIS — K922 Gastrointestinal hemorrhage, unspecified: Secondary | ICD-10-CM | POA: Diagnosis not present

## 2017-11-06 DIAGNOSIS — E1159 Type 2 diabetes mellitus with other circulatory complications: Secondary | ICD-10-CM

## 2017-11-06 DIAGNOSIS — N183 Chronic kidney disease, stage 3 unspecified: Secondary | ICD-10-CM

## 2017-11-06 DIAGNOSIS — D123 Benign neoplasm of transverse colon: Secondary | ICD-10-CM | POA: Diagnosis not present

## 2017-11-06 DIAGNOSIS — I428 Other cardiomyopathies: Secondary | ICD-10-CM | POA: Diagnosis not present

## 2017-11-06 DIAGNOSIS — E1122 Type 2 diabetes mellitus with diabetic chronic kidney disease: Secondary | ICD-10-CM | POA: Diagnosis not present

## 2017-11-06 DIAGNOSIS — G4733 Obstructive sleep apnea (adult) (pediatric): Secondary | ICD-10-CM | POA: Diagnosis not present

## 2017-11-06 DIAGNOSIS — Z79899 Other long term (current) drug therapy: Secondary | ICD-10-CM | POA: Diagnosis not present

## 2017-11-06 DIAGNOSIS — K921 Melena: Secondary | ICD-10-CM | POA: Diagnosis not present

## 2017-11-06 DIAGNOSIS — I700 Atherosclerosis of aorta: Secondary | ICD-10-CM | POA: Diagnosis present

## 2017-11-06 DIAGNOSIS — R0602 Shortness of breath: Secondary | ICD-10-CM | POA: Diagnosis not present

## 2017-11-06 DIAGNOSIS — Z7984 Long term (current) use of oral hypoglycemic drugs: Secondary | ICD-10-CM | POA: Diagnosis not present

## 2017-11-06 DIAGNOSIS — N184 Chronic kidney disease, stage 4 (severe): Secondary | ICD-10-CM | POA: Diagnosis not present

## 2017-11-06 DIAGNOSIS — Z7902 Long term (current) use of antithrombotics/antiplatelets: Secondary | ICD-10-CM | POA: Insufficient documentation

## 2017-11-06 DIAGNOSIS — I130 Hypertensive heart and chronic kidney disease with heart failure and stage 1 through stage 4 chronic kidney disease, or unspecified chronic kidney disease: Secondary | ICD-10-CM | POA: Diagnosis not present

## 2017-11-06 DIAGNOSIS — I5032 Chronic diastolic (congestive) heart failure: Secondary | ICD-10-CM | POA: Diagnosis present

## 2017-11-06 DIAGNOSIS — K635 Polyp of colon: Secondary | ICD-10-CM

## 2017-11-06 DIAGNOSIS — I152 Hypertension secondary to endocrine disorders: Secondary | ICD-10-CM

## 2017-11-06 DIAGNOSIS — E1129 Type 2 diabetes mellitus with other diabetic kidney complication: Secondary | ICD-10-CM | POA: Diagnosis present

## 2017-11-06 LAB — BASIC METABOLIC PANEL
BUN: 47 mg/dL — ABNORMAL HIGH (ref 6–23)
CHLORIDE: 105 meq/L (ref 96–112)
CO2: 25 mEq/L (ref 19–32)
Calcium: 9.3 mg/dL (ref 8.4–10.5)
Creatinine, Ser: 1.87 mg/dL — ABNORMAL HIGH (ref 0.40–1.20)
GFR: 34.25 mL/min — ABNORMAL LOW (ref >60.00)
Glucose, Bld: 92 mg/dL (ref 70–99)
POTASSIUM: 4.3 meq/L (ref 3.5–5.1)
SODIUM: 138 meq/L (ref 135–145)

## 2017-11-06 LAB — COMPREHENSIVE METABOLIC PANEL
ALT: 11 U/L (ref 0–44)
ANION GAP: 11 (ref 5–15)
AST: 41 U/L (ref 15–41)
Albumin: 4 g/dL (ref 3.5–5.0)
Alkaline Phosphatase: 60 U/L (ref 38–126)
BUN: 51 mg/dL — ABNORMAL HIGH (ref 8–23)
CHLORIDE: 107 mmol/L (ref 98–111)
CO2: 19 mmol/L — ABNORMAL LOW (ref 22–32)
CREATININE: 2.07 mg/dL — AB (ref 0.44–1.00)
Calcium: 9.1 mg/dL (ref 8.9–10.3)
GFR calc non Af Amer: 23 mL/min — ABNORMAL LOW (ref >60)
GFR, EST AFRICAN AMERICAN: 27 mL/min — AB (ref >60)
Glucose, Bld: 165 mg/dL — ABNORMAL HIGH (ref 70–99)
Potassium: 5.3 mmol/L — ABNORMAL HIGH (ref 3.5–5.1)
SODIUM: 137 mmol/L (ref 135–145)
Total Bilirubin: 0.9 mg/dL (ref 0.3–1.2)
Total Protein: 8.3 g/dL — ABNORMAL HIGH (ref 6.5–8.1)

## 2017-11-06 LAB — CBC
HCT: 28.3 % — ABNORMAL LOW (ref 36.0–46.0)
HEMATOCRIT: 29.9 % — AB (ref 36.0–46.0)
HEMOGLOBIN: 9.5 g/dL — AB (ref 12.0–15.0)
Hemoglobin: 9.3 g/dL — ABNORMAL LOW (ref 12.0–15.0)
MCH: 28.4 pg (ref 26.0–34.0)
MCHC: 32.8 g/dL (ref 30.0–36.0)
MCHC: 31.8 g/dL (ref 30.0–36.0)
MCV: 88.4 fl (ref 78.0–100.0)
MCV: 89.5 fL (ref 78.0–100.0)
Platelets: 262 10*3/uL (ref 150.0–400.0)
Platelets: 259 10*3/uL (ref 150–400)
RBC: 3.21 Mil/uL — ABNORMAL LOW (ref 3.87–5.11)
RBC: 3.34 MIL/uL — AB (ref 3.87–5.11)
RDW: 15.5 % (ref 11.5–15.5)
RDW: 15.7 % — ABNORMAL HIGH (ref 11.5–15.5)
WBC: 7.3 10*3/uL (ref 4.0–10.5)
WBC: 8.1 10*3/uL (ref 4.0–10.5)

## 2017-11-06 LAB — TYPE AND SCREEN
ABO/RH(D): O POS
Antibody Screen: NEGATIVE

## 2017-11-06 LAB — GLUCOSE, CAPILLARY: GLUCOSE-CAPILLARY: 105 mg/dL — AB (ref 70–99)

## 2017-11-06 LAB — TSH: TSH: 2.25 u[IU]/mL (ref 0.35–4.50)

## 2017-11-06 MED ORDER — TIZANIDINE HCL 4 MG PO TABS: 2.0000 mg | ORAL_TABLET | Freq: Every evening | ORAL | Status: DC | PRN

## 2017-11-06 MED ORDER — PANTOPRAZOLE SODIUM 40 MG PO TBEC: 40.0000 mg | DELAYED_RELEASE_TABLET | Freq: Every day | ORAL | Status: DC

## 2017-11-06 MED ORDER — ACETAMINOPHEN 650 MG RE SUPP: 650.0000 mg | Freq: Four times a day (QID) | RECTAL | Status: DC | PRN

## 2017-11-06 MED ORDER — CARVEDILOL 12.5 MG PO TABS
12.5000 mg | ORAL_TABLET | Freq: Two times a day (BID) | ORAL | Status: DC
  Administered 2017-11-07 – 2017-11-08 (×3): 12.5 mg via ORAL
  Filled 2017-11-06 (×3): qty 1

## 2017-11-06 MED ORDER — TRAMADOL HCL 50 MG PO TABS: 50.0000 mg | ORAL_TABLET | Freq: Every evening | ORAL | Status: DC | PRN

## 2017-11-06 MED ORDER — FUROSEMIDE 40 MG PO TABS
40.0000 mg | ORAL_TABLET | Freq: Two times a day (BID) | ORAL | Status: DC
  Administered 2017-11-07 – 2017-11-08 (×3): 40 mg via ORAL
  Filled 2017-11-06 (×3): qty 1

## 2017-11-06 MED ORDER — AMIODARONE HCL 200 MG PO TABS
200.0000 mg | ORAL_TABLET | Freq: Every day | ORAL | Status: DC
  Administered 2017-11-06 – 2017-11-07 (×2): 200 mg via ORAL
  Filled 2017-11-06 (×2): qty 1

## 2017-11-06 MED ORDER — INSULIN ASPART 100 UNIT/ML ~~LOC~~ SOLN
0.0000 [IU] | Freq: Three times a day (TID) | SUBCUTANEOUS | Status: DC
  Administered 2017-11-07: 2 [IU] via SUBCUTANEOUS
  Administered 2017-11-08: 1 [IU] via SUBCUTANEOUS

## 2017-11-06 MED ORDER — ISOSORB DINITRATE-HYDRALAZINE 20-37.5 MG PO TABS
1.0000 | ORAL_TABLET | Freq: Two times a day (BID) | ORAL | Status: DC
  Administered 2017-11-06 – 2017-11-08 (×4): 1 via ORAL
  Filled 2017-11-06 (×4): qty 1

## 2017-11-06 MED ORDER — LEVOTHYROXINE SODIUM 100 MCG IV SOLR
62.5000 ug | Freq: Every day | INTRAVENOUS | Status: DC
  Administered 2017-11-07 – 2017-11-08 (×2): 62.5 ug via INTRAVENOUS
  Filled 2017-11-06 (×2): qty 5

## 2017-11-06 MED ORDER — ACETAMINOPHEN 325 MG PO TABS: 650.0000 mg | ORAL_TABLET | Freq: Four times a day (QID) | ORAL | Status: DC | PRN

## 2017-11-06 MED ORDER — ALBUTEROL SULFATE (2.5 MG/3ML) 0.083% IN NEBU: 2.5000 mg | INHALATION_SOLUTION | Freq: Four times a day (QID) | RESPIRATORY_TRACT | Status: DC | PRN

## 2017-11-06 MED ORDER — SERTRALINE HCL 50 MG PO TABS
50.0000 mg | ORAL_TABLET | Freq: Every day | ORAL | Status: DC
  Administered 2017-11-06 – 2017-11-07 (×2): 50 mg via ORAL
  Filled 2017-11-06 (×2): qty 1

## 2017-11-06 NOTE — ED Provider Notes (Addendum)
[facility] DEPT Provider Note   CSN: [id number] Arrival date & time: 11/06/17  4332     History   Chief Complaint Chief Complaint  Patient presents with  . GI Bleeding    HPI Morgan Clay is a 72 y.o. female.  HPI Patient presents from of our primary care.  Recent admission to hospital for GI bleed.  Presumed upper and was on Eliquis.  Hemoglobin went from 14-7.  Had EGD without clear cause of bleeding.  Restarted Eliquis around a week ago and around 3 days after that began to have black stools again.  Now some mild lightheadedness.  Slight crampy abdominal pain.  No fevers or chills.  No chest pain.  Sent in for further evaluation and treatment. Past Medical History:  Diagnosis Date  . Arthritis   . Asthma    reports mild asthma since childhood - had COPD on dx list from prior PCP  . Atypical atrial flutter ([place]) 09/28/2015  . Chronic systolic congestive heart failure ([place])   . DEPRESSION 12/17/2009   Annotation: PHQ-9 score = 14 done on 12/17/2009 Qualifier: Diagnosis of  By: Hassell Done FNP, Tori Milks    . FIBROIDS, UTERUS 03/05/2008  . Glaucoma   . HEPATITIS C - s/p treatment with Harvoni, seeing hepatology, Dawn Drazek 01/04/2007  . Hyperlipemia 12/06/2012  . HYPERTENSION, BENIGN 04/24/2007  . Hypothyroidism   . LBBB (left bundle branch block)   . Nonischemic cardiomyopathy ([place])   . OBESITY 05/27/2009  . OBSTRUCTIVE SLEEP APNEA 11/14/2007   npsg 2009:  Mild osa with AHI 11/hr. Started cpap 2009, quit using due to lack of response Home sleep testing 09/2010 with AHI only 7/hr and weight loss advised by pulmonology.  . OSTEOPENIA 09/30/2008  . Personal history of other infectious and parasitic disease    Hepatitis B    Patient Active Problem List   Diagnosis Date Noted  . GI bleed 10/25/2017  . Symptomatic anemia 10/25/2017  . Atrial fibrillation, chronic ([place]) 10/25/2017  . Aortic atherosclerosis ([place]) 08/30/2017  . Pulmonary edema, acute  ([place]) 08/09/2017  . Pain and swelling of left upper extremity 08/08/2017  . ARF (acute renal failure) ([place])   . Enteritis   . Partial small bowel obstruction ([place])   . Diverticulitis 07/31/2017  . Persistent atrial fibrillation ([place])   . Hypertension associated with diabetes ([place]) 06/07/2017  . MDD (major depressive disorder), recurrent, in partial remission ([place]) 06/07/2017  . Asthma 05/22/2017  . Chronic obstructive pulmonary disease ([place])   . Atypical atrial flutter ([place]) 09/28/2015  . Type 2 diabetes, controlled, with renal manifestation ([place]) 11/24/2013  . Chronic kidney disease - followed by [facility] 11/24/2013  . Nonischemic cardiomyopathy ([place]) 11/22/2010  . Obesity 05/27/2009  . OSTEOPENIA 09/30/2008  . OBSTRUCTIVE SLEEP APNEA 11/14/2007  . Chronic diastolic CHF (congestive heart failure) ([place]) 04/24/2007  . Hypothyroidism 01/28/2007  . Biventricular ICD (implantable cardioverter-defibrillator) in place 01/08/2007    Past Surgical History:  Procedure Laterality Date  . appendectomy    . CARDIAC CATHETERIZATION    . CARDIOVERSION N/A 12/14/2014   Procedure: CARDIOVERSION;  Surgeon: Dorothy Spark, MD;  Location: [facility] ENDOSCOPY;  Service: Cardiovascular;  Laterality: N/A;  . CARDIOVERSION N/A 02/26/2017   Procedure: CARDIOVERSION;  Surgeon: Evans Lance, MD;  Location: [facility] CV LAB;  Service: Cardiovascular;  Laterality: N/A;  . CARDIOVERSION N/A 07/24/2017   Procedure: CARDIOVERSION;  Surgeon: Thayer Headings, MD;  Location: [facility];  Service: Cardiovascular;  Laterality: N/A;  .  EP IMPLANTABLE DEVICE N/A 10/06/2015   Procedure: BIV ICD Generator Changeout;  Surgeon: [name], MD;  Location: [facility] CV LAB;  Service: Cardiovascular;  Laterality: N/A;  . ESOPHAGOGASTRODUODENOSCOPY N/A 10/26/2017   Procedure: ESOPHAGOGASTRODUODENOSCOPY (EGD);  Surgeon: Ladene Artist, MD;  Location: [facility] ENDOSCOPY;  Service: Endoscopy;  Laterality: N/A;  . HOT  HEMOSTASIS N/A 10/26/2017   Procedure: HOT HEMOSTASIS (ARGON PLASMA COAGULATION/BICAP);  Surgeon: Ladene Artist, MD;  Location: [facility] ENDOSCOPY;  Service: Endoscopy;  Laterality: N/A;     OB History   None      Home Medications    Prior to Admission medications   Medication Sig Start Date End Date Taking? Authorizing Provider  albuterol (PROVENTIL HFA;VENTOLIN HFA) 108 (90 BASE) MCG/ACT inhaler Inhale 2 puffs into the lungs every 6 (six) hours as needed for wheezing or shortness of breath. 01/01/15  Yes [name], DO  amiodarone (PACERONE) 200 MG tablet Take 200 mg by mouth at bedtime.    Yes [provider]  BIDIL 20-37.5 MG tablet TAKE 1 TABLET BY MOUTH TWICE DAILY 04/26/17  Yes [name], MD  carvedilol (COREG) 12.5 MG tablet Take 1 tablet (12.5 mg total) by mouth 2 (two) times daily with a meal. 08/17/17  Yes Hosie Poisson, MD  ELIQUIS 5 MG TABS tablet TAKE ONE TABLET BY MOUTH TWICE DAILY 07/11/17  Yes [name], MD  furosemide (LASIX) 40 MG tablet Take 1 tablet (40 mg total) by mouth 2 (two) times daily. 08/17/17  Yes Hosie Poisson, MD  levothyroxine (SYNTHROID, LEVOTHROID) 125 MCG tablet Take 125 mcg by mouth daily before breakfast.   Yes [provider]  nitroGLYCERIN (NITROSTAT) 0.4 MG SL tablet Place 0.4 mg under the tongue every 5 (five) minutes as needed for chest pain.   Yes [provider]  pantoprazole (PROTONIX) 40 MG tablet Take 1 tablet (40 mg total) by mouth daily. 10/26/17  Yes Georgette Shell, MD  sertraline (ZOLOFT) 50 MG tablet Take 1 tablet (50 mg total) by mouth daily. Patient taking differently: Take 50 mg by mouth at bedtime.  06/26/17  Yes [name], DO  spironolactone (ALDACTONE) 25 MG tablet Take 1 tablet (25 mg total) daily by mouth. Patient taking differently: Take 25 mg by mouth at bedtime.  03/20/17  Yes Imogene Burn, PA-C  tiZANidine (ZANAFLEX) 2 MG tablet Take 1 tablet (2 mg total) by mouth at bedtime as  needed for muscle spasms. 10/09/17  Yes [name], DO  traMADol (ULTRAM) 50 MG tablet Take 50 mg by mouth at bedtime as needed (sleep).   Yes [provider]  Hydrocortisone (GERHARDT'S BUTT CREAM) CREA Apply 1 application topically 2 (two) times daily. Patient not taking: Reported on 11/06/2017 08/17/17   Hosie Poisson, MD  Tetrahydrozoline HCl (VISINE OP) Place 1 drop into both eyes daily as needed (DRY EYES).    [provider]    Family History Family History  Problem Relation Age of Onset  . Asthma Father   . Heart attack Father   . Asthma Sister   . Cancer Sister        lung  . Heart attack Mother   . Stroke Brother   . Diabetes Unknown     Social History Social History   Tobacco Use  . Smoking status: Never Smoker  . Smokeless tobacco: Never Used  Substance Use Topics  . Alcohol use: Yes    Comment: 3 glasses of wine 3-4  times per week  . Drug use: No     Allergies   Patient has no known allergies.   Review of Systems Review of Systems  Constitutional: Negative for appetite change.  HENT: Negative for congestion.   Respiratory: Positive for shortness of breath.   Cardiovascular: Negative for chest pain.  Gastrointestinal: Positive for abdominal pain and blood in stool.  Musculoskeletal: Negative for back pain.  Skin: Negative for rash.  Hematological: Bruises/bleeds easily.  Psychiatric/Behavioral: Negative for confusion.     Physical Exam Updated Vital Signs BP (!) 157/80 (BP Location: Right Arm)   Pulse 65   Temp 97.8 F (36.6 C) (Oral)   Resp 20   SpO2 98%   Physical Exam  Constitutional: She appears well-developed.  HENT:  Head: Normocephalic.  Eyes: Pupils are equal, round, and reactive to light.  Neck: Neck supple.  Cardiovascular: Normal rate.  Pulmonary/Chest: Effort normal.  Abdominal: Soft.  Mild upper abdominal tenderness without rebound or guarding.  Musculoskeletal: She exhibits no edema.  Neurological: She is  alert.  Skin: Skin is warm. Capillary refill takes less than 2 seconds.     ED Treatments / Results  Labs (all labs ordered are listed, but only abnormal results are displayed) Labs Reviewed  COMPREHENSIVE METABOLIC PANEL - Abnormal; Notable for the following components:      Result Value   Potassium 5.3 (*)    CO2 19 (*)    Glucose, Bld 165 (*)    BUN 51 (*)    Creatinine, Ser 2.07 (*)    Total Protein 8.3 (*)    GFR calc non Af Amer 23 (*)    GFR calc Af Amer 27 (*)    All other components within normal limits  CBC - Abnormal; Notable for the following components:   RBC 3.34 (*)    Hemoglobin 9.5 (*)    HCT 29.9 (*)    RDW 15.7 (*)    All other components within normal limits  TYPE AND SCREEN    EKG None  Radiology No results found.  Procedures Procedures (including critical care time)  Medications Ordered in ED Medications - No data to display   Initial Impression / Assessment and Plan / ED Course  I have reviewed the triage vital signs and the nursing notes.  Pertinent labs & imaging results that were available during my care of the patient were reviewed by me and considered in my medical decision making (see chart for details).     Patient with recurrent GI bleeding.  Recent admission for same.  After transfusion hemoglobin had been up to 10.  Now down to mid nines.  She is however symptomatic.  Lightheaded with standing.  Has recurrent melena.  Recent EGD did not show clear cause of bleeding.  Will admit to hospitalist and have attempted to consult with our gastroenterology.  Patient has recently been started back on Eliquis for her atrial fibrillation.  CRITICAL CARE Performed by: Davonna Belling Total critical care time: 30 minutes Critical care time was exclusive of separately billable procedures and treating other patients. Critical care was necessary to treat or prevent imminent or life-threatening deterioration. Critical care was time spent  personally by me on the following activities: development of treatment plan with patient and/or surrogate as well as nursing, discussions with consultants, evaluation of patient's response to treatment, examination of patient, obtaining history from patient or surrogate, ordering and performing treatments and interventions, ordering and review of laboratory studies, ordering and review of  radiographic studies, pulse oximetry and re-evaluation of patient's condition.  Discussed with Dr. Henrene Pastor.  States to keep her n.p.o. and will likely be scoped tomorrow by Dr. Ardis Hughs. does not need IV Protonix at this time.  Should be able to hold Eliquis again.   Final Clinical Impressions(s) / ED Diagnoses   Final diagnoses:  Acute GI bleeding  Anemia, unspecified type    ED Discharge Orders    None       Davonna Belling, MD 11/06/17 1736    Davonna Belling, MD 11/06/17 1739

## 2017-11-06 NOTE — ED Notes (Signed)
Pt reports SOB upon exertion and feeling fatigued since last week.

## 2017-11-06 NOTE — Telephone Encounter (Signed)
Yes, a patient having an acute GI bleed on anticoagulation needs to go to the emergency room and will likely be admitted

## 2017-11-06 NOTE — ED Triage Notes (Addendum)
Patient here from home sent from [facility] for GI bleed. Dark stools. Patient is on Eliquis. Denies n/v/d. Fatigue.

## 2017-11-06 NOTE — Progress Notes (Signed)
Reviewed with Dr Caryl Comes in the office.  No changes today and continue to monitor.

## 2017-11-06 NOTE — Progress Notes (Signed)
Spoke with patient.  She was hospitalized 10/25/2017 for Hgb of 7 and colon infection.  She received blood transfusion and fluids during hospitalization.  Weight today is 144 lbs and baseline is 135-140 lbs. She has PCP appt today, GI physician next week and appt with kidney specialist in the next month.  She said she still does not feel well in regards to the colon.  She was on vacation in the last week so that may account for fluid accumulation.  Advised will review with Dr Caryl Comes in the office and if any recommendations will call her back.  Will recheck fluid levels 11/13/2017.

## 2017-11-06 NOTE — H&P (Signed)
History and Physical    Tereasa Yilmaz GYB:[id number] DOB: 1946/01/10 DOA: 11/06/2017  PCP: [name], DO Patient coming from: Home  Chief Complaint: Fatigue  HPI: Morgan Clay is a 72 y.o. female with medical history significant of CAD page 4, atrial fibrillation, chronic diastolic heart failure, status post biventricular ICD, melena, angiodysplastic lesion of the stomach, asthma/COPD.  Patient presented from her PCPs office today.  She sought medical attention because she was feeling tired.  She reports restarting her Eliquis after her EGD with subsequent resumption of melanotic stools.  Her last melanotic stool was this morning.  She last took her Eliquis this morning as well.  Her melanotic stools have remained stable.  ED Course: Vitals: Afebrile, normal pulse, Normal respirations, Slightly hypertensive, on room air Labs: Potassium of 5.3, CO2 of 19, creatinine of 2.07, hemoglobin of 9.5 Imaging: None Medications/Course: None  Review of Systems: Review of Systems  Constitutional: Positive for malaise/fatigue.  Respiratory: Positive for shortness of breath.   Cardiovascular: Negative for chest pain.  Gastrointestinal: Positive for melena. Negative for abdominal pain, blood in stool, constipation, diarrhea, nausea and vomiting.  Neurological: Negative for dizziness.  All other systems reviewed and are negative.   Past Medical History:  Diagnosis Date  . Arthritis   . Asthma    reports mild asthma since childhood - had COPD on dx list from prior PCP  . Atypical atrial flutter ([place]) 09/28/2015  . Chronic systolic congestive heart failure ([place])   . DEPRESSION 12/17/2009   Annotation: PHQ-9 score = 14 done on 12/17/2009 Qualifier: Diagnosis of  By: Hassell Done FNP, Tori Milks    . FIBROIDS, UTERUS 03/05/2008  . Glaucoma   . HEPATITIS C - s/p treatment with Harvoni, seeing hepatology, Dawn Drazek 01/04/2007  . Hyperlipemia 12/06/2012  . HYPERTENSION, BENIGN 04/24/2007  .  Hypothyroidism   . LBBB (left bundle branch block)   . Nonischemic cardiomyopathy ([place])   . OBESITY 05/27/2009  . OBSTRUCTIVE SLEEP APNEA 11/14/2007   npsg 2009:  Mild osa with AHI 11/hr. Started cpap 2009, quit using due to lack of response Home sleep testing 09/2010 with AHI only 7/hr and weight loss advised by pulmonology.  . OSTEOPENIA 09/30/2008  . Personal history of other infectious and parasitic disease    Hepatitis B    Past Surgical History:  Procedure Laterality Date  . appendectomy    . CARDIAC CATHETERIZATION    . CARDIOVERSION N/A 12/14/2014   Procedure: CARDIOVERSION;  Surgeon: Dorothy Spark, MD;  Location: [facility] ENDOSCOPY;  Service: Cardiovascular;  Laterality: N/A;  . CARDIOVERSION N/A 02/26/2017   Procedure: CARDIOVERSION;  Surgeon: Evans Lance, MD;  Location: [place] CV LAB;  Service: Cardiovascular;  Laterality: N/A;  . CARDIOVERSION N/A 07/24/2017   Procedure: CARDIOVERSION;  Surgeon: Thayer Headings, MD;  Location: [facility] ENDOSCOPY;  Service: Cardiovascular;  Laterality: N/A;  . EP IMPLANTABLE DEVICE N/A 10/06/2015   Procedure: BIV ICD Generator Changeout;  Surgeon: [name], MD;  Location: [place] CV LAB;  Service: Cardiovascular;  Laterality: N/A;  . ESOPHAGOGASTRODUODENOSCOPY N/A 10/26/2017   Procedure: ESOPHAGOGASTRODUODENOSCOPY (EGD);  Surgeon: Ladene Artist, MD;  Location: [facility] ENDOSCOPY;  Service: Endoscopy;  Laterality: N/A;  . HOT HEMOSTASIS N/A 10/26/2017   Procedure: HOT HEMOSTASIS (ARGON PLASMA COAGULATION/BICAP);  Surgeon: Ladene Artist, MD;  Location: [facility] ENDOSCOPY;  Service: Endoscopy;  Laterality: N/A;     reports that she has never smoked. She has never used smokeless tobacco. She reports that she drinks alcohol.  She reports that she does not use drugs.  No Known Allergies  Family History  Problem Relation Age of Onset  . Asthma Father   . Heart attack Father   . Asthma Sister   . Cancer Sister        lung  . Heart attack Mother    . Stroke Brother   . Diabetes Unknown    Prior to Admission medications   Medication Sig Start Date End Date Taking? Authorizing Provider  albuterol (PROVENTIL HFA;VENTOLIN HFA) 108 (90 BASE) MCG/ACT inhaler Inhale 2 puffs into the lungs every 6 (six) hours as needed for wheezing or shortness of breath. 01/01/15  Yes [name], DO  amiodarone (PACERONE) 200 MG tablet Take 200 mg by mouth at bedtime.    Yes [provider]  BIDIL 20-37.5 MG tablet TAKE 1 TABLET BY MOUTH TWICE DAILY 04/26/17  Yes [name], MD  carvedilol (COREG) 12.5 MG tablet Take 1 tablet (12.5 mg total) by mouth 2 (two) times daily with a meal. 08/17/17  Yes Hosie Poisson, MD  ELIQUIS 5 MG TABS tablet TAKE ONE TABLET BY MOUTH TWICE DAILY 07/11/17  Yes [name], MD  furosemide (LASIX) 40 MG tablet Take 1 tablet (40 mg total) by mouth 2 (two) times daily. 08/17/17  Yes Hosie Poisson, MD  levothyroxine (SYNTHROID, LEVOTHROID) 125 MCG tablet Take 125 mcg by mouth daily before breakfast.   Yes [provider]  nitroGLYCERIN (NITROSTAT) 0.4 MG SL tablet Place 0.4 mg under the tongue every 5 (five) minutes as needed for chest pain.   Yes [provider]  pantoprazole (PROTONIX) 40 MG tablet Take 1 tablet (40 mg total) by mouth daily. 10/26/17  Yes Georgette Shell, MD  sertraline (ZOLOFT) 50 MG tablet Take 1 tablet (50 mg total) by mouth daily. Patient taking differently: Take 50 mg by mouth at bedtime.  06/26/17  Yes [name], DO  spironolactone (ALDACTONE) 25 MG tablet Take 1 tablet (25 mg total) daily by mouth. Patient taking differently: Take 25 mg by mouth at bedtime.  03/20/17  Yes Imogene Burn, PA-C  tiZANidine (ZANAFLEX) 2 MG tablet Take 1 tablet (2 mg total) by mouth at bedtime as needed for muscle spasms. 10/09/17  Yes [name], DO  traMADol (ULTRAM) 50 MG tablet Take 50 mg by mouth at bedtime as needed (sleep).   Yes [provider]  Hydrocortisone (GERHARDT'S  BUTT CREAM) CREA Apply 1 application topically 2 (two) times daily. Patient not taking: Reported on 11/06/2017 08/17/17   Hosie Poisson, MD  Tetrahydrozoline HCl (VISINE OP) Place 1 drop into both eyes daily as needed (DRY EYES).    [provider]    Physical Exam:  Physical Exam  Constitutional: She is oriented to person, place, and time. She appears well-developed and well-nourished. No distress.  HENT:  Mouth/Throat: Oropharynx is clear and moist.  Eyes: Pupils are equal, round, and reactive to light. Conjunctivae and EOM are normal.  Neck: Normal range of motion.  Cardiovascular: Normal rate, regular rhythm and normal heart sounds.  No murmur heard. Pulmonary/Chest: Effort normal and breath sounds normal. No respiratory distress. She has no wheezes. She has no rales.  Abdominal: Soft. Bowel sounds are normal. She exhibits no distension. There is no tenderness. There is no rebound and no guarding.  Musculoskeletal: Normal range of motion. She exhibits no edema or tenderness.  Lymphadenopathy:    She has no cervical adenopathy.  Neurological: She is alert  and oriented to person, place, and time.  Skin: Skin is warm and dry. She is not diaphoretic.  Psychiatric: She has a normal mood and affect. Thought content normal.     Labs on Admission: I have personally reviewed following labs and imaging studies  CBC: Recent Labs  Lab 11/06/17 1429 11/06/17 1631  WBC 7.3 8.1  HGB 9.3* 9.5*  HCT 28.3* 29.9*  MCV 88.4 89.5  PLT 262.0 440    Basic Metabolic Panel: Recent Labs  Lab 11/06/17 1429 11/06/17 1631  NA 138 137  K 4.3 5.3*  CL 105 107  CO2 25 19*  GLUCOSE 92 165*  BUN 47* 51*  CREATININE 1.87* 2.07*  CALCIUM 9.3 9.1    GFR: Estimated Creatinine Clearance: 21 mL/min (A) (by C-G formula based on SCr of 2.07 mg/dL (H)).  Liver Function Tests: Recent Labs  Lab 11/06/17 1631  AST 41  ALT 11  ALKPHOS 60  BILITOT 0.9  PROT 8.3*  ALBUMIN 4.0   No  results for input(s): LIPASE, AMYLASE in the last 168 hours. No results for input(s): AMMONIA in the last 168 hours.  Coagulation Profile: No results for input(s): INR, PROTIME in the last 168 hours.  Cardiac Enzymes: No results for input(s): CKTOTAL, CKMB, CKMBINDEX, TROPONINI in the last 168 hours.  BNP (last 3 results) No results for input(s): PROBNP in the last 8760 hours.  HbA1C: No results for input(s): HGBA1C in the last 72 hours.  CBG: No results for input(s): GLUCAP in the last 168 hours.  Lipid Profile: No results for input(s): CHOL, HDL, LDLCALC, TRIG, CHOLHDL, LDLDIRECT in the last 72 hours.  Thyroid Function Tests: Recent Labs    11/06/17 1429  TSH 2.25    Anemia Panel: No results for input(s): VITAMINB12, FOLATE, FERRITIN, TIBC, IRON, RETICCTPCT in the last 72 hours.  Urine analysis:    Component Value Date/Time   COLORURINE YELLOW 08/03/2017 1003   APPEARANCEUR HAZY (A) 08/03/2017 1003   LABSPEC 1.019 08/03/2017 1003   PHURINE 5.0 08/03/2017 1003   GLUCOSEU NEGATIVE 08/03/2017 1003   HGBUR NEGATIVE 08/03/2017 1003   HGBUR negative 02/28/2008 0000   BILIRUBINUR NEGATIVE 08/03/2017 1003   KETONESUR 5 (A) 08/03/2017 1003   PROTEINUR NEGATIVE 08/03/2017 1003   UROBILINOGEN 1.0 07/19/2010 1752   NITRITE NEGATIVE 08/03/2017 1003   LEUKOCYTESUR NEGATIVE 08/03/2017 1003     Radiological Exams on Admission: No results found.  EKG: None obtained  Assessment/Plan Active Problems:   Chronic diastolic CHF (congestive heart failure) (HCC)   Biventricular ICD (implantable cardioverter-defibrillator) in place   Type 2 diabetes, controlled, with renal manifestation (HCC)   Chronic kidney disease - followed by [facility]   Aortic atherosclerosis ([place])   Atrial fibrillation, chronic ([place])   Melena   Melena Symptomatic anemia Complicated by chronic anticoagulation secondary to atrial fibrillation.  GI consulted by the ED physician.  Tentative plan  for EGD in the morning.  No blood transfusion given.  Patient with a recent EGD on 6/21 significant for a nonbleeding angiodysplastic lesion in the stomach treated with APC -GI recommendations -Continue Protonix p.o. -N.p.o. after midnight -Repeat CBC in the morning  Atrial fibrillation Currently rate controlled.  Also with antiarrhythmic on board. -Continue amiodarone and Coreg -Hold Eliquis in setting of GI bleeding  CKD stage IV Baseline creatinine appears to be around 1.9-2.0 currently at baseline.  Chronic diastolic heart failure Last echo significant for normal EF and grade 2 diastolic dysfunction.  Patient is currently euvolemic. -Continue  Lasix, Coreg -Hold spironolactone in setting of mild hyperkalemia  Hyperkalemia Mild.  -Obtain EKG -Hold spironolactone -Recheck BMP in AM  Type 2 diabetes mellitus Last hemoglobin A1c of 6.8% in January 2019.  Patient is currently not on pharmacotherapy. -Heart healthy/carb modified diet -Sliding scale insulin sensitive scale  COPD/Asthma Minimal dyspnea likely associated with anemia -Continue home albuterol prn  S/p Biventricular ICD   DVT prophylaxis: SCDs Code Status: Full code Family Communication: None at bedside Disposition Plan: Discharge home likely tomorrow pending GI management Consults called: Gastroenterology, low-power Admission status: Observation, medical floor   Cordelia Poche, MD [facility]  If 7PM-7AM, please contact night-coverage [email or web address] Password [facility]  11/06/2017, 5:44 PM

## 2017-11-06 NOTE — Progress Notes (Signed)
HPI:  Using dictation device. Unfortunately this device frequently misinterprets words/phrases.  Morgan Clay is a pleasant 72 y.o. with a PMH significant for CHF, atrial fibrillation, cardiomyopathy, hypothyroidism, diabetes, diverticulitis, CKD, colon polyps and depression here for a hospital follow up. See transitional care phone note in Epic. Per review of discharge documents and patient: Hospitalized 10/25/2017 to delete 10/26/2017 Primary admitting complaint(s): Lightheadedness, black stools, shortness of breath, fatigue Primary admitting diagnosis (es) and treatment: Gastrointestinal hemorrhage, symptomatic anemia Other significant diagnosis (es) and treatment: Anemia, GI bleeding, GI was consulted, had upper endoscopy.  Eliquis was held, restarted on 10/30/2017.  She was instructed to take 40 mg of Protonix daily and follow-up with gastroenterology outpatient.  She was given a transfusion and her acute kidney injury improved. Follow up concerns per discharge document: Follow-up with GI, BMP/CBC in 1 week Reports today: was feeling better after discharge but unfortunately have multiple episodes of melena daily the last few days and feeling bad again - weak, tired, wiped out. She had restarted eliquis on the 25th as instructed. Denies:CP, significant SOB, vomiting, fevers   ROS: See pertinent positives and negatives per HPI.  Past Medical History:  Diagnosis Date  . Arthritis   . Asthma    reports mild asthma since childhood - had COPD on dx list from prior PCP  . Atypical atrial flutter ([place]) 09/28/2015  . Chronic systolic congestive heart failure ([place])   . DEPRESSION 12/17/2009   Annotation: PHQ-9 score = 14 done on 12/17/2009 Qualifier: Diagnosis of  By: Hassell Done FNP, Tori Milks    . FIBROIDS, UTERUS 03/05/2008  . Glaucoma   . HEPATITIS C - s/p treatment with Harvoni, seeing hepatology, Dawn Drazek 01/04/2007  . Hyperlipemia 12/06/2012  . HYPERTENSION, BENIGN 04/24/2007  .  Hypothyroidism   . LBBB (left bundle branch block)   . Nonischemic cardiomyopathy ([place])   . OBESITY 05/27/2009  . OBSTRUCTIVE SLEEP APNEA 11/14/2007   npsg 2009:  Mild osa with AHI 11/hr. Started cpap 2009, quit using due to lack of response Home sleep testing 09/2010 with AHI only 7/hr and weight loss advised by pulmonology.  . OSTEOPENIA 09/30/2008  . Personal history of other infectious and parasitic disease    Hepatitis B    Past Surgical History:  Procedure Laterality Date  . appendectomy    . CARDIAC CATHETERIZATION    . CARDIOVERSION N/A 12/14/2014   Procedure: CARDIOVERSION;  Surgeon: Dorothy Spark, MD;  Location: [facility] ENDOSCOPY;  Service: Cardiovascular;  Laterality: N/A;  . CARDIOVERSION N/A 02/26/2017   Procedure: CARDIOVERSION;  Surgeon: Evans Lance, MD;  Location: [place] CV LAB;  Service: Cardiovascular;  Laterality: N/A;  . CARDIOVERSION N/A 07/24/2017   Procedure: CARDIOVERSION;  Surgeon: Thayer Headings, MD;  Location: [facility] ENDOSCOPY;  Service: Cardiovascular;  Laterality: N/A;  . EP IMPLANTABLE DEVICE N/A 10/06/2015   Procedure: BIV ICD Generator Changeout;  Surgeon: [name], MD;  Location: [place] CV LAB;  Service: Cardiovascular;  Laterality: N/A;  . ESOPHAGOGASTRODUODENOSCOPY N/A 10/26/2017   Procedure: ESOPHAGOGASTRODUODENOSCOPY (EGD);  Surgeon: Ladene Artist, MD;  Location: [facility] ENDOSCOPY;  Service: Endoscopy;  Laterality: N/A;  . HOT HEMOSTASIS N/A 10/26/2017   Procedure: HOT HEMOSTASIS (ARGON PLASMA COAGULATION/BICAP);  Surgeon: Ladene Artist, MD;  Location: [facility] ENDOSCOPY;  Service: Endoscopy;  Laterality: N/A;    Family History  Problem Relation Age of Onset  . Asthma Father   . Heart attack Father   . Asthma Sister   . Cancer Sister  lung  . Heart attack Mother   . Stroke Brother   . Diabetes Unknown     SOCIAL HX: see hpi   Current Outpatient Medications:  .  albuterol (PROVENTIL HFA;VENTOLIN HFA) 108 (90 BASE) MCG/ACT  inhaler, Inhale 2 puffs into the lungs every 6 (six) hours as needed for wheezing or shortness of breath., Disp: 1 Inhaler, Rfl: 3 .  amiodarone (PACERONE) 200 MG tablet, Take 200 mg by mouth at bedtime. , Disp: , Rfl:  .  BIDIL 20-37.5 MG tablet, TAKE 1 TABLET BY MOUTH TWICE DAILY, Disp: 180 tablet, Rfl: 3 .  carvedilol (COREG) 12.5 MG tablet, Take 1 tablet (12.5 mg total) by mouth 2 (two) times daily with a meal., Disp: 60 tablet, Rfl: 0 .  ELIQUIS 5 MG TABS tablet, TAKE ONE TABLET BY MOUTH TWICE DAILY, Disp: 180 tablet, Rfl: 3 .  furosemide (LASIX) 40 MG tablet, Take 1 tablet (40 mg total) by mouth 2 (two) times daily., Disp: 60 tablet, Rfl: 0 .  Hydrocortisone (GERHARDT'S BUTT CREAM) CREA, Apply 1 application topically 2 (two) times daily., Disp: 1 each, Rfl: 2 .  levothyroxine (SYNTHROID, LEVOTHROID) 125 MCG tablet, Take 125 mcg by mouth daily before breakfast., Disp: , Rfl:  .  nitroGLYCERIN (NITROSTAT) 0.4 MG SL tablet, Place 0.4 mg under the tongue every 5 (five) minutes as needed for chest pain., Disp: , Rfl:  .  pantoprazole (PROTONIX) 40 MG tablet, Take 1 tablet (40 mg total) by mouth daily., Disp: 30 tablet, Rfl: 0 .  sertraline (ZOLOFT) 50 MG tablet, Take 1 tablet (50 mg total) by mouth daily. (Patient taking differently: Take 50 mg by mouth at bedtime. ), Disp: 30 tablet, Rfl: 3 .  spironolactone (ALDACTONE) 25 MG tablet, Take 1 tablet (25 mg total) daily by mouth. (Patient taking differently: Take 25 mg by mouth at bedtime. ), Disp: 90 tablet, Rfl: 3 .  Tetrahydrozoline HCl (VISINE OP), Place 1 drop into both eyes daily as needed (DRY EYES)., Disp: , Rfl:  .  tiZANidine (ZANAFLEX) 2 MG tablet, Take 1 tablet (2 mg total) by mouth at bedtime as needed for muscle spasms., Disp: 20 tablet, Rfl: 0 .  traZODone (DESYREL) 50 MG tablet, Take 50 mg by mouth at bedtime., Disp: , Rfl:   EXAM:  Vitals:   11/06/17 1353  BP: (!) 120/58  Pulse: 61  Temp: 98.4 F (36.9 C)  SpO2: 98%    Body  mass index is 28.92 kg/m.  GENERAL: vitals reviewed and listed above, alert, oriented, appears well hydrated and in no acute distress  HEENT: atraumatic, conjunttiva clear, no obvious abnormalities on inspection of external nose and ears  NECK: no obvious masses on inspection  LUNGS: clear to auscultation bilaterally, no wheezes, rales or rhonchi, good air movement  CV: HRRR, no peripheral edema  MS: moves all extremities without noticeable abnormality  PSYCH: pleasant and cooperative, no obvious depression or anxiety  ASSESSMENT AND PLAN:  Discussed the following assessment and plan: More than 50% of over 40 minutes spent in total in caring for this patient was spent face-to-face with the patient, counseling and/or coordinating care.    Melena  Other fatigue  Atrial fibrillation, chronic (HCC)  Nonischemic cardiomyopathy (HCC)  Controlled type 2 diabetes mellitus with chronic kidney disease, without long-term current use of insulin, unspecified CKD stage (HCC)  Stage 3 chronic kidney disease ([place])  Hypertension associated with diabetes ([place])  -recurrent melena, likely symptomatic anemia in complicated pt with heart, kidney disease on  eliquis -stat labs pending -contacted GI/Cardiology about symptoms with recs from both to go to ER - advised pt and she agrees to go - will have assistant notify ER triage -pt declined EMS transport, stated her granddaughter will take her     There are no Patient Instructions on file for this visit.  [name], DO

## 2017-11-06 NOTE — Telephone Encounter (Signed)
Routed to DOD, patient of Dr. Fuller Plan, currently in the PCP office with recent GI bleed. She is taking Eliquis, having black stools, fatigue, weakness. PCP office called and I spoke to [facility] who relayed the symptoms patient was having,we do not have any available appointments here.  Advised to go to the ED for further evaluation.

## 2017-11-06 NOTE — ED Notes (Signed)
ED TO INPATIENT HANDOFF REPORT  Name/Age/Gender Morgan Clay 72 y.o. female  Code Status Code Status History    Date Active Date Inactive Code Status Order ID Comments User Context   10/25/2017 1412 10/26/2017 1908 Full Code [id number]  Dessa Phi, DO Inpatient   07/31/2017 2051 08/17/2017 1720 Full Code [id number]  Bennie Pierini, MD Inpatient   02/23/2017 0040 03/01/2017 1553 Full Code [id number]  Rise Patience, MD ED   10/02/2015 0404 10/03/2015 1943 Full Code [id number]  Norval Morton, MD ED      Home/SNF/Other Home  Chief Complaint sent from doctor   Level of Care/Admitting Diagnosis ED Disposition    ED Disposition Condition [place]: [facility] [100102]  Level of Care: Med-Surg [16]  Diagnosis: Melena [557322]  Admitting Physician: Mariel Aloe [phone]  Attending Physician: Mariel Aloe [phone]  PT Class (Do Not Modify): Observation [104]  PT Acc Code (Do Not Modify): Observation [10022]       Medical History Past Medical History:  Diagnosis Date  . Arthritis   . Asthma    reports mild asthma since childhood - had COPD on dx list from prior PCP  . Atypical atrial flutter ([place]) 09/28/2015  . Chronic systolic congestive heart failure ([place])   . DEPRESSION 12/17/2009   Annotation: PHQ-9 score = 14 done on 12/17/2009 Qualifier: Diagnosis of  By: Hassell Done FNP, Tori Milks    . FIBROIDS, UTERUS 03/05/2008  . Glaucoma   . HEPATITIS C - s/p treatment with Harvoni, seeing hepatology, Dawn Drazek 01/04/2007  . Hyperlipemia 12/06/2012  . HYPERTENSION, BENIGN 04/24/2007  . Hypothyroidism   . LBBB (left bundle branch block)   . Nonischemic cardiomyopathy ([place])   . OBESITY 05/27/2009  . OBSTRUCTIVE SLEEP APNEA 11/14/2007   npsg 2009:  Mild osa with AHI 11/hr. Started cpap 2009, quit using due to lack of response Home sleep testing 09/2010 with AHI only 7/hr and weight loss advised by pulmonology.  . OSTEOPENIA 09/30/2008   . Personal history of other infectious and parasitic disease    Hepatitis B    Allergies No Known Allergies  IV Location/Drains/Wounds Patient Lines/Drains/Airways Status   Active Line/Drains/Airways    Name:   Placement date:   Placement time:   Site:   Days:   Peripheral IV 11/06/17 Right Antecubital   11/06/17    1630    Antecubital   less than 1          Labs/Imaging Results for orders placed or performed during the hospital encounter of 11/06/17 (from the past 48 hour(s))  Comprehensive metabolic panel     Status: Abnormal   Collection Time: 11/06/17  4:31 PM  Result Value Ref Range   Sodium 137 135 - 145 mmol/L   Potassium 5.3 (H) 3.5 - 5.1 mmol/L   Chloride 107 98 - 111 mmol/L    Comment: Please note change in reference range.   CO2 19 (L) 22 - 32 mmol/L   Glucose, Bld 165 (H) 70 - 99 mg/dL    Comment: Please note change in reference range.   BUN 51 (H) 8 - 23 mg/dL    Comment: Please note change in reference range.   Creatinine, Ser 2.07 (H) 0.44 - 1.00 mg/dL   Calcium 9.1 8.9 - 10.3 mg/dL   Total Protein 8.3 (H) 6.5 - 8.1 g/dL   Albumin 4.0 3.5 - 5.0 g/dL   AST 41 15 - 41 U/L  ALT 11 0 - 44 U/L    Comment: Please note change in reference range.   Alkaline Phosphatase 60 38 - 126 U/L   Total Bilirubin 0.9 0.3 - 1.2 mg/dL   GFR calc non Af Amer 23 (L) >60 mL/min   GFR calc Af Amer 27 (L) >60 mL/min    Comment: (NOTE) The eGFR has been calculated using the CKD EPI equation. This calculation has not been validated in all clinical situations. eGFR's persistently <60 mL/min signify possible Chronic Kidney Disease.    Anion gap 11 5 - 15    Comment: Performed at [facility], [place] [address] [place], [place] [address]  CBC     Status: Abnormal   Collection Time: 11/06/17  4:31 PM  Result Value Ref Range   WBC 8.1 4.0 - 10.5 K/uL   RBC 3.34 (L) 3.87 - 5.11 MIL/uL   Hemoglobin 9.5 (L) 12.0 - 15.0 g/dL   HCT 29.9 (L) 36.0 - 46.0 %   MCV  89.5 78.0 - 100.0 fL   MCH 28.4 26.0 - 34.0 pg   MCHC 31.8 30.0 - 36.0 g/dL   RDW 15.7 (H) 11.5 - 15.5 %   Platelets 259 150 - 400 K/uL    Comment: Performed at [facility], [place] [address], [place], [place] [address]   No results found.  Pending Labs Unresulted Labs (From admission, onward)   Start     Ordered   11/06/17 1700  Type and screen  Once,   STAT     11/06/17 1700   Signed and Held  Basic metabolic panel  Tomorrow morning,   R     Signed and Held   Signed and Held  CBC  Tomorrow morning,   R     Signed and Held      Vitals/Pain Today's Vitals   11/06/17 1613 11/06/17 1617 11/06/17 1635 11/06/17 1916  BP:  (!) 157/80  (!) 131/48  Pulse:  65  62  Resp:  20  18  Temp:  97.8 F (36.6 C)    TempSrc:  Oral    SpO2:  98%  100%  PainSc: 0-No pain  0-No pain     Isolation Precautions No active isolations  Medications Medications  insulin aspart (novoLOG) injection 0-9 Units (has no administration in time range)    Mobility walks

## 2017-11-07 ENCOUNTER — Observation Stay (HOSPITAL_COMMUNITY): Payer: Medicare Other | Admitting: Anesthesiology

## 2017-11-07 ENCOUNTER — Encounter (HOSPITAL_COMMUNITY): Payer: Self-pay | Admitting: *Deleted

## 2017-11-07 ENCOUNTER — Other Ambulatory Visit: Payer: Self-pay

## 2017-11-07 ENCOUNTER — Encounter (HOSPITAL_COMMUNITY)
Admission: EM | Disposition: A | Discharge status: Home / Self Care | Payer: Self-pay | Source: Home / Self Care | Attending: Emergency Medicine

## 2017-11-07 DIAGNOSIS — K31819 Angiodysplasia of stomach and duodenum without bleeding: Secondary | ICD-10-CM | POA: Diagnosis not present

## 2017-11-07 DIAGNOSIS — K922 Gastrointestinal hemorrhage, unspecified: Secondary | ICD-10-CM | POA: Diagnosis not present

## 2017-11-07 DIAGNOSIS — J45909 Unspecified asthma, uncomplicated: Secondary | ICD-10-CM | POA: Diagnosis not present

## 2017-11-07 DIAGNOSIS — Z7901 Long term (current) use of anticoagulants: Secondary | ICD-10-CM | POA: Diagnosis not present

## 2017-11-07 DIAGNOSIS — D649 Anemia, unspecified: Secondary | ICD-10-CM

## 2017-11-07 DIAGNOSIS — D123 Benign neoplasm of transverse colon: Secondary | ICD-10-CM | POA: Diagnosis not present

## 2017-11-07 DIAGNOSIS — K259 Gastric ulcer, unspecified as acute or chronic, without hemorrhage or perforation: Secondary | ICD-10-CM | POA: Diagnosis not present

## 2017-11-07 DIAGNOSIS — K921 Melena: Secondary | ICD-10-CM | POA: Diagnosis not present

## 2017-11-07 DIAGNOSIS — K5730 Diverticulosis of large intestine without perforation or abscess without bleeding: Secondary | ICD-10-CM | POA: Diagnosis not present

## 2017-11-07 DIAGNOSIS — I130 Hypertensive heart and chronic kidney disease with heart failure and stage 1 through stage 4 chronic kidney disease, or unspecified chronic kidney disease: Secondary | ICD-10-CM | POA: Diagnosis not present

## 2017-11-07 HISTORY — PX: ESOPHAGOGASTRODUODENOSCOPY (EGD) WITH PROPOFOL: SHX5813

## 2017-11-07 LAB — CBC
HCT: 27.7 % — ABNORMAL LOW (ref 36.0–46.0)
Hemoglobin: 8.8 g/dL — ABNORMAL LOW (ref 12.0–15.0)
MCH: 28.4 pg (ref 26.0–34.0)
MCHC: 31.8 g/dL (ref 30.0–36.0)
MCV: 89.4 fL (ref 78.0–100.0)
PLATELETS: 243 10*3/uL (ref 150–400)
RBC: 3.1 MIL/uL — ABNORMAL LOW (ref 3.87–5.11)
RDW: 15.4 % (ref 11.5–15.5)
WBC: 7.4 10*3/uL (ref 4.0–10.5)

## 2017-11-07 LAB — BASIC METABOLIC PANEL
Anion gap: 10 (ref 5–15)
BUN: 52 mg/dL — AB (ref 8–23)
CALCIUM: 9.2 mg/dL (ref 8.9–10.3)
CO2: 24 mmol/L (ref 22–32)
CREATININE: 2.1 mg/dL — AB (ref 0.44–1.00)
Chloride: 107 mmol/L (ref 98–111)
GFR calc Af Amer: 27 mL/min — ABNORMAL LOW (ref >60)
GFR, EST NON AFRICAN AMERICAN: 23 mL/min — AB (ref >60)
Glucose, Bld: 114 mg/dL — ABNORMAL HIGH (ref 70–99)
Potassium: 4 mmol/L (ref 3.5–5.1)
SODIUM: 141 mmol/L (ref 135–145)

## 2017-11-07 LAB — GLUCOSE, CAPILLARY
GLUCOSE-CAPILLARY: 110 mg/dL — AB (ref 70–99)
GLUCOSE-CAPILLARY: 110 mg/dL — AB (ref 70–99)
Glucose-Capillary: 180 mg/dL — ABNORMAL HIGH (ref 70–99)
Glucose-Capillary: 113 mg/dL — ABNORMAL HIGH (ref 70–99)

## 2017-11-07 LAB — HEMOGLOBIN AND HEMATOCRIT, BLOOD
HEMATOCRIT: 28.5 % — AB (ref 36.0–46.0)
HEMOGLOBIN: 9.1 g/dL — AB (ref 12.0–15.0)

## 2017-11-07 SURGERY — ESOPHAGOGASTRODUODENOSCOPY (EGD) WITH PROPOFOL
Anesthesia: Monitor Anesthesia Care

## 2017-11-07 MED ORDER — PROPOFOL 10 MG/ML IV BOLUS
INTRAVENOUS | Status: AC
  Filled 2017-11-07: qty 20

## 2017-11-07 MED ORDER — SODIUM CHLORIDE 0.9 % IV SOLN
INTRAVENOUS | Status: DC
  Administered 2017-11-07: 14:00:00 via INTRAVENOUS

## 2017-11-07 MED ORDER — FAMOTIDINE IN NACL 20-0.9 MG/50ML-% IV SOLN
20.0000 mg | Freq: Two times a day (BID) | INTRAVENOUS | Status: DC
  Administered 2017-11-07: 20 mg via INTRAVENOUS
  Filled 2017-11-07: qty 50

## 2017-11-07 MED ORDER — PEG-KCL-NACL-NASULF-NA ASC-C 100 G PO SOLR: 1.0000 | Freq: Two times a day (BID) | ORAL | Status: DC

## 2017-11-07 MED ORDER — PEG-KCL-NACL-NASULF-NA ASC-C 100 G PO SOLR
0.5000 | Freq: Once | ORAL | Status: AC
  Administered 2017-11-07: 100 g via ORAL
  Filled 2017-11-07: qty 1

## 2017-11-07 MED ORDER — ONDANSETRON HCL 4 MG/2ML IJ SOLN
INTRAMUSCULAR | Status: DC | PRN
  Administered 2017-11-07: 4 mg via INTRAVENOUS

## 2017-11-07 MED ORDER — PEG-KCL-NACL-NASULF-NA ASC-C 100 G PO SOLR
0.5000 | Freq: Once | ORAL | Status: AC
  Administered 2017-11-08: 100 g via ORAL

## 2017-11-07 MED ORDER — ONDANSETRON HCL 4 MG/2ML IJ SOLN: 4.0000 mg | Freq: Once | INTRAMUSCULAR | Status: DC | PRN

## 2017-11-07 MED ORDER — PANTOPRAZOLE SODIUM 40 MG PO TBEC
40.0000 mg | DELAYED_RELEASE_TABLET | Freq: Two times a day (BID) | ORAL | Status: DC
  Administered 2017-11-07 – 2017-11-08 (×3): 40 mg via ORAL
  Filled 2017-11-07 (×3): qty 1

## 2017-11-07 MED ORDER — PROPOFOL 10 MG/ML IV BOLUS
INTRAVENOUS | Status: DC | PRN
  Administered 2017-11-07 (×2): 20 mg via INTRAVENOUS

## 2017-11-07 MED ORDER — PROPOFOL 500 MG/50ML IV EMUL
INTRAVENOUS | Status: DC | PRN
  Administered 2017-11-07: 130 ug/kg/min via INTRAVENOUS

## 2017-11-07 SURGICAL SUPPLY — 14 items

## 2017-11-07 NOTE — Transfer of Care (Signed)
Immediate Anesthesia Transfer of Care Note  Patient: Morgan Clay  Procedure(s) Performed: ESOPHAGOGASTRODUODENOSCOPY (EGD) WITH PROPOFOL (N/A )  Patient Location: PACU and Endoscopy Unit  Anesthesia Type:MAC  Level of Consciousness: awake, alert , oriented and patient cooperative  Airway & Oxygen Therapy: Patient Spontanous Breathing and Patient connected to nasal cannula oxygen  Post-op Assessment: Report given to RN, Post -op Vital signs reviewed and stable and Patient moving all extremities  Post vital signs: Reviewed and stable  Last Vitals:  Vitals Value Taken Time  BP 130/44 11/07/2017  2:26 PM  Temp    Pulse 60 11/07/2017  2:27 PM  Resp 24 11/07/2017  2:27 PM  SpO2 92 % 11/07/2017  2:27 PM  Vitals shown include unvalidated device data.  Last Pain:  Vitals:   11/07/17 1320  TempSrc: Oral  PainSc: 0-No pain         Complications: No apparent anesthesia complications

## 2017-11-07 NOTE — Progress Notes (Signed)
[facility] Progress Note  Patient: Morgan Clay PJK:[id number]   PCP: [name], DO DOB: 04/22/1946   DOA: 11/06/2017   DOS: 11/07/2017   Date of Service: the patient was seen and examined on 11/07/2017  Subjective: Continues to have loose dark tarry BM.  No abdominal pain no nausea no vomiting.  No fever no chills.  Brief hospital course: Pt. with PMH of CAD page 4, atrial fibrillation, chronic diastolic heart failure, status post biventricular ICD, melena, angiodysplastic lesion of the stomach, asthma/COPD; admitted on 11/06/2017, presented with complaint of fatigue, was found to have symptomatic anemia with melena. Currently further plan is colonoscopy tomorrow.  Assessment and Plan: 1.  Symptomatic anemia Melena. AVM S/P APC Underwent EGD on 11/07/2017 small ulcer clean based otherwise normal endoscopic exam. H&H has changed from 9.3 on admission to 9.1. Since the patient is to continue anticoagulation and has recurrent presentation with GI bleeding, GI recommends patient is to undergo colonoscopy for further evaluation. Patient will be getting prep today and colonoscopy tomorrow. Monitor recommendation from GI post procedure. Likely will be on clear liquid diet today. Continue to hold Eliquis for now.  2.  Chronic A. fib. S/P by biV AICD placement. CAD. Nonischemic cardiomyopathy. Chronic systolic and diastolic CHF. Cardioversion October 2018. Home medication include amiodarone 200 mg, BiDil 1 twice daily, Coreg 12.5 twice daily, Eliquis 5 twice daily, Lasix 40 twice daily, Aldactone 25 daily. Currently holding Aldactone due to hyperkalemia. Also holding Eliquis given GI bleed. Resume when stable.  3.  Chronic kidney disease stage IV. Serum creatinine around baseline.  Monitor.  4.  Mild hyperkalemia. Aldactone is currently on hold, potassium now better.  Monitor.  5.  Type 2 diabetes mellitus. Controlled hemoglobin A1c 6.8 in June 05, 2016. Not on  medication. Diet controlled. Patient no chronic diabetic nephropathy. Monitor on sliding scale insulin for now.  6.  COPD-asthma-OSA. PRN albuterol, monitor oxygen.  Diet: clear liquid diet DVT Prophylaxis: mechanical compression device  Advance goals of care discussion: full code  Family Communication: no family was present at bedside, at the time of interview.   Disposition:  Discharge to home.  Consultants: gastroenterology  Procedures: EGD  Antibiotics: Anti-infectives (From admission, onward)   None       Objective: Physical Exam: Vitals:   11/07/17 0523 11/07/17 1320 11/07/17 1427 11/07/17 1430  BP: (!) 152/57 (!) 154/56 (!) 130/44 (!) 137/46  Pulse: 60 60 60 60  Resp: 16 16 (!) 24 (!) 23  Temp: 99.2 F (37.3 C) 98.8 F (37.1 C) 98.2 F (36.8 C)   TempSrc: Oral Oral Oral   SpO2: 98% 97% 92% 94%  Weight:  66.1 kg (145 lb 11.2 oz)    Height:  4\' 11"  (1.499 m)      Intake/Output Summary (Last 24 hours) at 11/07/2017 1533 Last data filed at 11/07/2017 1500 Gross per 24 hour  Intake 164.25 ml  Output -  Net 164.25 ml   Filed Weights   11/06/17 2000 11/07/17 1320  Weight: 66.1 kg (145 lb 11.2 oz) 66.1 kg (145 lb 11.2 oz)   General: Alert, Awake and Oriented to Time, Place and Person. Appear in mild distress, affect appropriate Eyes: PERRL, Conjunctiva normal ENT: Oral Mucosa clear moist. Neck: no JVD, no Abnormal Mass Or lumps Cardiovascular: S1 and S2 Present, no Murmur, Peripheral Pulses Present Respiratory: normal respiratory effort, Bilateral Air entry equal and Decreased, no use of accessory muscle, Clear to Auscultation, no Crackles, no wheezes Abdomen: Bowel Sound  present, Soft and no tenderness, no hernia Skin: no redness, no Rash, no induration Extremities: no Pedal edema, no calf tenderness Neurologic: Grossly no focal neuro deficit. Bilaterally Equal motor strength  Data Reviewed: CBC: Recent Labs  Lab 11/06/17 1429 11/06/17 1631  11/07/17 0450 11/07/17 1016  WBC 7.3 8.1 7.4  --   HGB 9.3* 9.5* 8.8* 9.1*  HCT 28.3* 29.9* 27.7* 28.5*  MCV 88.4 89.5 89.4  --   PLT 262.0 259 243  --    Basic Metabolic Panel: Recent Labs  Lab 11/06/17 1429 11/06/17 1631 11/07/17 0450  NA 138 137 141  K 4.3 5.3* 4.0  CL 105 107 107  CO2 25 19* 24  GLUCOSE 92 165* 114*  BUN 47* 51* 52*  CREATININE 1.87* 2.07* 2.10*  CALCIUM 9.3 9.1 9.2    Liver Function Tests: Recent Labs  Lab 11/06/17 1631  AST 41  ALT 11  ALKPHOS 60  BILITOT 0.9  PROT 8.3*  ALBUMIN 4.0   No results for input(s): LIPASE, AMYLASE in the last 168 hours. No results for input(s): AMMONIA in the last 168 hours. Coagulation Profile: No results for input(s): INR, PROTIME in the last 168 hours. Cardiac Enzymes: No results for input(s): CKTOTAL, CKMB, CKMBINDEX, TROPONINI in the last 168 hours. BNP (last 3 results) No results for input(s): PROBNP in the last 8760 hours. CBG: Recent Labs  Lab 11/06/17 2032 11/07/17 0746 11/07/17 1142  GLUCAP 105* 110* 110*   Studies: No results found.  Scheduled Meds: . amiodarone  200 mg Oral QHS  . carvedilol  12.5 mg Oral BID WC  . furosemide  40 mg Oral BID  . insulin aspart  0-9 Units Subcutaneous TID WC  . isosorbide-hydrALAZINE  1 tablet Oral BID  . levothyroxine  62.5 mcg Intravenous QAC breakfast  . pantoprazole  40 mg Oral BID  . sertraline  50 mg Oral QHS   Continuous Infusions: PRN Meds: acetaminophen **OR** acetaminophen, albuterol, tiZANidine, traMADol  Time spent: 35 minutes  Author: Berle Mull, MD [facility] Pager: [phone] 11/07/2017 3:33 PM  If 7PM-7AM, please contact night-coverage at [email or web address], password [facility]

## 2017-11-07 NOTE — Anesthesia Postprocedure Evaluation (Signed)
Anesthesia Post Note  Patient: Morgan Clay  Procedure(s) Performed: ESOPHAGOGASTRODUODENOSCOPY (EGD) WITH PROPOFOL (N/A )     Patient location during evaluation: PACU Anesthesia Type: MAC Level of consciousness: awake and alert and oriented Pain management: pain level controlled Vital Signs Assessment: post-procedure vital signs reviewed and stable Respiratory status: spontaneous breathing, nonlabored ventilation and respiratory function stable Cardiovascular status: stable and blood pressure returned to baseline Postop Assessment: no apparent nausea or vomiting Anesthetic complications: no    Last Vitals:  Vitals:   11/07/17 1427 11/07/17 1430  BP: (!) 130/44 (!) 137/46  Pulse: 60 60  Resp: (!) 24 (!) 23  Temp: 36.8 C   SpO2: 92% 94%    Last Pain:  Vitals:   11/07/17 1430  TempSrc:   PainSc: 0-No pain                 Rukaya Kleinschmidt A.

## 2017-11-07 NOTE — Op Note (Signed)
[facility] Patient Name: Morgan Clay Procedure Date: 11/07/2017 MRN: [id number] Attending MD: Milus Banister , MD Date of Birth: May 30, 1945 CSN: [id number] Age: 72 Admit Type: Inpatient Procedure:                Upper GI endoscopy Indications:              Melena, recent EGD wtih APC Dr. Fuller Plan about 10 days                            ago, treated small gastric AVM when she presented                            with melena, anemia Providers:                Milus Banister, MD, Elmer Ramp. Tilden Dome, RN, [employer], Technician, [employer]. [place], CRNA Referring MD:              Medicines:                Monitored Anesthesia Care Complications:            No immediate complications. Estimated blood loss:                            None. Estimated Blood Loss:     Estimated blood loss: none. Procedure:                Pre-Anesthesia Assessment:                           - Prior to the procedure, a History and Physical                            was performed, and patient medications and                            allergies were reviewed. The patient's tolerance of                            previous anesthesia was also reviewed. The risks                            and benefits of the procedure and the sedation                            options and risks were discussed with the patient.                            All questions were answered, and informed consent                            was obtained. Prior Anticoagulants: The patient has  taken Eliquis (apixaban), last dose was 1 day prior                            to procedure. ASA Grade Assessment: III - A patient                            with severe systemic disease. After reviewing the                            risks and benefits, the patient was deemed in                            satisfactory condition to undergo the procedure.                            After obtaining informed consent, the endoscope was                            passed under direct vision. Throughout the                            procedure, the patient's blood pressure, pulse, and                            oxygen saturations were monitored continuously. The                            EG-[id number] ([id number]) scope was introduced through the                            mouth, and advanced to the second part of duodenum.                            The upper GI endoscopy was accomplished without                            difficulty. The patient tolerated the procedure                            well. Scope In: Scope Out: Findings:      The esophagus was normal.      There was a small, clean based ulcer at the site of recent AVM APC. The       stomach was otherwise normal.      The examined duodenum was normal. Impression:               - There was a small ulcer at the site of the recent                            AVM APC. I am not really certain if this ulcer (or                            the previous gastric AVM) are the culprits here.  She has not had a colonoscopy in 8 years and since                            she is currently off her blood thinner eliquis, I                            think it is safest to repeat that while she is in                            the hospital. Moderate Sedation:      N/A- Per Anesthesia Care Recommendation:           - Return to floor.                           - Colonoscopy tomorrow. Procedure Code(s):        --- Professional ---                           [phone], Esophagogastroduodenoscopy, flexible,                            transoral; diagnostic, including collection of                            specimen(s) by brushing or washing, when performed                            (separate procedure) Diagnosis Code(s):        --- Professional ---                           K92.1, Melena (includes  Hematochezia) CPT copyright 2017 [employer]. All rights reserved. The codes documented in this report are preliminary and upon coder review may  be revised to meet current compliance requirements. Milus Banister, MD 11/07/2017 2:26:37 PM This report has been signed electronically. Number of Addenda: 0

## 2017-11-07 NOTE — Consult Note (Addendum)
Consultation  Referring Provider: triad hospitalist/ [facility] Primary Care Physician:  [name], DO Primary Gastroenterologist:  Dr.Gessner  Reason for Consultation:  Melena  HPI: Morgan Clay is a 72 y.o. female, admitted through the emergency room last evening after being sent by her PCP to the ER.  Patient had presented with complaints of fatigue and onset of melena with 2-3 black stools daily each day since Saturday, 11/03/2017.  Patient did take her Eliquis yesterday morning. She complained of weakness and occasional lightheadedness no syncope chest pain or shortness of breath. Patient had a very recent admission 6/20 through 10/27/2017 also with complaints of melena in setting of Eliquis.  She had undergone EGD with Dr. Fuller Plan on 10/26/2017 with finding of a single 4 mm AVM in the greater curvature of the stomach.  This was treated with APC.  She remained off of Eliquis for 5 days and restarted it on 10/30/2017. She is not on any aspirin or NSAIDs.  She has had no complaints of abdominal pain or nausea. Last colonoscopy was done in 2011 with finding of scattered diverticulosis and one 3 mm hyperplastic polyp was removed. Patient has history of nonischemic cardiomyopathy status post biventricular ICD, atrial fibrillation, coronary artery disease, congestive heart failure, COPD, and hepatitis C.  She has not required transfusions this admission, but did require blood last admission. Hemoglobin on discharge from the hospital 10/25/2017 was up to 10.1.  On arrival yesterday hemoglobin 9.3, hematocrit of 28.3, creatinine 1.87 Hemoglobin down to 8.8 this morning. Patient did have one small melenic stool this morning says this is definitely less than she was having at home.   Past Medical History:  Diagnosis Date  . Arthritis   . Asthma    reports mild asthma since childhood - had COPD on dx list from prior PCP  . Atypical atrial flutter ([place]) 09/28/2015  . Chronic systolic  congestive heart failure ([place])   . DEPRESSION 12/17/2009   Annotation: PHQ-9 score = 14 done on 12/17/2009 Qualifier: Diagnosis of  By: Hassell Done FNP, Tori Milks    . FIBROIDS, UTERUS 03/05/2008  . Glaucoma   . HEPATITIS C - s/p treatment with Harvoni, seeing hepatology, Dawn Drazek 01/04/2007  . Hyperlipemia 12/06/2012  . HYPERTENSION, BENIGN 04/24/2007  . Hypothyroidism   . LBBB (left bundle branch block)   . Nonischemic cardiomyopathy ([place])   . OBESITY 05/27/2009  . OBSTRUCTIVE SLEEP APNEA 11/14/2007   npsg 2009:  Mild osa with AHI 11/hr. Started cpap 2009, quit using due to lack of response Home sleep testing 09/2010 with AHI only 7/hr and weight loss advised by pulmonology.  . OSTEOPENIA 09/30/2008  . Personal history of other infectious and parasitic disease    Hepatitis B    Past Surgical History:  Procedure Laterality Date  . appendectomy    . CARDIAC CATHETERIZATION    . CARDIOVERSION N/A 12/14/2014   Procedure: CARDIOVERSION;  Surgeon: Dorothy Spark, MD;  Location: [facility] ENDOSCOPY;  Service: Cardiovascular;  Laterality: N/A;  . CARDIOVERSION N/A 02/26/2017   Procedure: CARDIOVERSION;  Surgeon: Evans Lance, MD;  Location: [facility] CV LAB;  Service: Cardiovascular;  Laterality: N/A;  . CARDIOVERSION N/A 07/24/2017   Procedure: CARDIOVERSION;  Surgeon: Thayer Headings, MD;  Location: [facility] ENDOSCOPY;  Service: Cardiovascular;  Laterality: N/A;  . EP IMPLANTABLE DEVICE N/A 10/06/2015   Procedure: BIV ICD Generator Changeout;  Surgeon: [name], MD;  Location: [facility] CV LAB;  Service: Cardiovascular;  Laterality: N/A;  .  ESOPHAGOGASTRODUODENOSCOPY N/A 10/26/2017   Procedure: ESOPHAGOGASTRODUODENOSCOPY (EGD);  Surgeon: Ladene Artist, MD;  Location: [facility] ENDOSCOPY;  Service: Endoscopy;  Laterality: N/A;  . HOT HEMOSTASIS N/A 10/26/2017   Procedure: HOT HEMOSTASIS (ARGON PLASMA COAGULATION/BICAP);  Surgeon: Ladene Artist, MD;  Location: [facility] ENDOSCOPY;  Service: Endoscopy;   Laterality: N/A;    Prior to Admission medications   Medication Sig Start Date End Date Taking? Authorizing Provider  albuterol (PROVENTIL HFA;VENTOLIN HFA) 108 (90 BASE) MCG/ACT inhaler Inhale 2 puffs into the lungs every 6 (six) hours as needed for wheezing or shortness of breath. 01/01/15  Yes [name], DO  amiodarone (PACERONE) 200 MG tablet Take 200 mg by mouth at bedtime.    Yes [provider]  BIDIL 20-37.5 MG tablet TAKE 1 TABLET BY MOUTH TWICE DAILY 04/26/17  Yes [name], MD  carvedilol (COREG) 12.5 MG tablet Take 1 tablet (12.5 mg total) by mouth 2 (two) times daily with a meal. 08/17/17  Yes Hosie Poisson, MD  ELIQUIS 5 MG TABS tablet TAKE ONE TABLET BY MOUTH TWICE DAILY 07/11/17  Yes [name], MD  furosemide (LASIX) 40 MG tablet Take 1 tablet (40 mg total) by mouth 2 (two) times daily. 08/17/17  Yes Hosie Poisson, MD  levothyroxine (SYNTHROID, LEVOTHROID) 125 MCG tablet Take 125 mcg by mouth daily before breakfast.   Yes [provider]  nitroGLYCERIN (NITROSTAT) 0.4 MG SL tablet Place 0.4 mg under the tongue every 5 (five) minutes as needed for chest pain.   Yes [provider]  pantoprazole (PROTONIX) 40 MG tablet Take 1 tablet (40 mg total) by mouth daily. 10/26/17  Yes Georgette Shell, MD  sertraline (ZOLOFT) 50 MG tablet Take 1 tablet (50 mg total) by mouth daily. Patient taking differently: Take 50 mg by mouth at bedtime.  06/26/17  Yes [name], DO  spironolactone (ALDACTONE) 25 MG tablet Take 1 tablet (25 mg total) daily by mouth. Patient taking differently: Take 25 mg by mouth at bedtime.  03/20/17  Yes Imogene Burn, PA-C  tiZANidine (ZANAFLEX) 2 MG tablet Take 1 tablet (2 mg total) by mouth at bedtime as needed for muscle spasms. 10/09/17  Yes [name], DO  traMADol (ULTRAM) 50 MG tablet Take 50 mg by mouth at bedtime as needed for severe pain.    Yes [provider]  Hydrocortisone (GERHARDT'S BUTT CREAM)  CREA Apply 1 application topically 2 (two) times daily. Patient not taking: Reported on 11/06/2017 08/17/17   Hosie Poisson, MD  Tetrahydrozoline HCl (VISINE OP) Place 1 drop into both eyes daily as needed (DRY EYES).    [provider]    Current Facility-Administered Medications  Medication Dose Route Frequency Provider Last Rate Last Dose  . acetaminophen (TYLENOL) tablet 650 mg  650 mg Oral Q6H PRN Mariel Aloe, MD       Or  . acetaminophen (TYLENOL) suppository 650 mg  650 mg Rectal Q6H PRN Mariel Aloe, MD      . albuterol (PROVENTIL) (2.5 MG/3ML) 0.083% nebulizer solution 2.5 mg  2.5 mg Nebulization Q6H PRN Mariel Aloe, MD      . amiodarone (PACERONE) tablet 200 mg  200 mg Oral QHS Mariel Aloe, MD   200 mg at 11/06/17 2226  . carvedilol (COREG) tablet 12.5 mg  12.5 mg Oral BID WC Mariel Aloe, MD   12.5 mg at 11/07/17 0829  . famotidine (PEPCID) IVPB 20 mg premix  20  mg Intravenous Q12H Lavina Hamman, MD 100 mL/hr at 11/07/17 0829 20 mg at 11/07/17 0829  . furosemide (LASIX) tablet 40 mg  40 mg Oral BID Mariel Aloe, MD   40 mg at 11/07/17 0829  . insulin aspart (novoLOG) injection 0-9 Units  0-9 Units Subcutaneous TID WC Mariel Aloe, MD      . isosorbide-hydrALAZINE (BIDIL) 20-37.5 MG per tablet 1 tablet  1 tablet Oral BID Mariel Aloe, MD   1 tablet at 11/07/17 [phone]  . levothyroxine (SYNTHROID, LEVOTHROID) injection 62.5 mcg  62.5 mcg Intravenous QAC breakfast Mariel Aloe, MD   62.5 mcg at 11/07/17 0829  . pantoprazole (PROTONIX) EC tablet 40 mg  40 mg Oral BID Lavina Hamman, MD   40 mg at 11/07/17 0829  . sertraline (ZOLOFT) tablet 50 mg  50 mg Oral QHS Mariel Aloe, MD   50 mg at 11/06/17 2225  . tiZANidine (ZANAFLEX) tablet 2 mg  2 mg Oral QHS PRN Mariel Aloe, MD      . traMADol Veatrice Bourbon) tablet 50 mg  50 mg Oral QHS PRN Mariel Aloe, MD        Allergies as of 11/06/2017  . (No Known Allergies)    Family History  Problem  Relation Age of Onset  . Asthma Father   . Heart attack Father   . Asthma Sister   . Cancer Sister        lung  . Heart attack Mother   . Stroke Brother   . Diabetes Unknown     Social History   Socioeconomic History  . Marital status: Single    Spouse name: Not on file  . Number of children: Not on file  . Years of education: Not on file  . Highest education level: Not on file  Occupational History  . Not on file  Social Needs  . Financial resource strain: Not on file  . Food insecurity:    Worry: Not on file    Inability: Not on file  . Transportation needs:    Medical: Not on file    Non-medical: Not on file  Tobacco Use  . Smoking status: Never Smoker  . Smokeless tobacco: Never Used  Substance and Sexual Activity  . Alcohol use: Yes    Comment: 3 glasses of wine 3-4 times per week  . Drug use: No  . Sexual activity: Not on file  Lifestyle  . Physical activity:    Days per week: Not on file    Minutes per session: Not on file  . Stress: Not on file  Relationships  . Social connections:    Talks on phone: Not on file    Gets together: Not on file    Attends religious service: Not on file    Active member of club or organization: Not on file    Attends meetings of clubs or organizations: Not on file    Relationship status: Not on file  . Intimate partner violence:    Fear of current or ex partner: Not on file    Emotionally abused: Not on file    Physically abused: Not on file    Forced sexual activity: Not on file  Other Topics Concern  . Not on file  Social History Narrative   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      Home Situation: lives alone      Spiritual Beliefs: [place]  Lifestyle: no regular exercise; poor diet             Review of Systems: Pertinent positive and negative review of systems were noted in the above HPI section.  All other review of systems was otherwise negative. Physical Exam: Vital signs in  last 24 hours: Temp:  [97.6 F (36.4 C)-99.2 F (37.3 C)] 99.2 F (37.3 C) (07/03 0523) Pulse Rate:  [60-65] 60 (07/03 0523) Resp:  [16-20] 16 (07/03 0523) BP: (120-166)/(48-80) 152/57 (07/03 0523) SpO2:  [98 %-100 %] 98 % (07/03 0523) Weight:  [143 lb 3.2 oz (65 kg)-145 lb 11.2 oz (66.1 kg)] 145 lb 11.2 oz (66.1 kg) (07/02 2000) Last BM Date: 11/06/17 General:   Alert,  Well-developed, well-nourished,AA female  pleasant and cooperative in NAD Head:  Normocephalic and atraumatic. Eyes:  Sclera clear, no icterus.   Conjunctiva pale Ears:  Normal auditory acuity. Nose:  No deformity, discharge,  or lesions. Mouth:  No deformity or lesions.   Neck:  Supple; no masses or thyromegaly. Lungs:  Clear throughout to auscultation.   No wheezes, crackles, or rhonchi. Heart:  Regular rate and rhythm; no murmurs, clicks, rubs,  or gallops.Pacer left chest wall Abdomen:  Soft,nontender, BS active,nonpalp mass or hsm.   Rectal:  Deferred , melena documented Msk:  Symmetrical without gross deformities. . Pulses:  Normal pulses noted. Extremities:  Without clubbing or edema. Neurologic:  Alert and  oriented x4;  grossly normal neurologically. Skin:  Intact without significant lesions or rashes.. Psych:  Alert and cooperative. Normal mood and affect.  Intake/Output from previous day: No intake/output data recorded. Intake/Output this shift: No intake/output data recorded.  Lab Results: Recent Labs    11/06/17 1429 11/06/17 1631 11/07/17 0450  WBC 7.3 8.1 7.4  HGB 9.3* 9.5* 8.8*  HCT 28.3* 29.9* 27.7*  PLT 262.0 259 243   BMET Recent Labs    11/06/17 1429 11/06/17 1631 11/07/17 0450  NA 138 137 141  K 4.3 5.3* 4.0  CL 105 107 107  CO2 25 19* 24  GLUCOSE 92 165* 114*  BUN 47* 51* 52*  CREATININE 1.87* 2.07* 2.10*  CALCIUM 9.3 9.1 9.2   LFT Recent Labs    11/06/17 1631  PROT 8.3*  ALBUMIN 4.0  AST 41  ALT 11  ALKPHOS 60  BILITOT 0.9   PT/INR No results for input(s):  LABPROT, INR in the last 72 hours. Hepatitis Panel No results for input(s): HEPBSAG, HCVAB, HEPAIGM, HEPBIGM in the last 72 hours.   IMPRESSION:  #74 72 year old African-American female admitted with complaints of weakness in setting of 3-day history of melena after restarting her Eliquis a few days prior. Patient had hospital admission 6/20 through 10/27/2017 also with melena had undergone EGD with finding of a single 4 mm AVM along the greater curve of the stomach which was treated with APC.  Suspect she may have developed an ulcer at prior APC site, also consider other previously nonvisualized AVMs either gastric or small bowel.  #2 normocytic anemia secondary to above #3 atrial fibrillation #4 coronary artery disease-nonischemic cardiomyopathy #5 congestive heart failure #6 status post biventricular ICD #7 COPD   Plan; Keep n.p.o. Continue every 8 hour hemoglobins and transfuse for hemoglobin 8 or less Hold Eliquis Patient is currently on IV Pepcid and p.o. Protonix, will continue until after EGD this afternoon Patient has been scheduled for upper endoscopy with Dr. Ardis Hughs this afternoon.  Procedure was discussed in detail with the patient including indications risks and  benefits and she is agreeable to proceed If EGD unrevealing will need to consider colonoscopy and/or capsule endoscopy.      [name] Esterwood  11/07/2017, 9:58 AM  ________________________________________________________________________  Velora Heckler GI MD note:  I personally examined the patient, reviewed the data and agree with the assessment and plan described above.  Planning on  EGD today.   Owens Loffler, MD [facility] Pager [phone]

## 2017-11-07 NOTE — H&P (View-Only) (Signed)
Consultation  Referring Provider: [facility]ad hospitalist/ [place] Primary Care Physician:  [name], DO Primary Gastroenterologist:  Dr.Gessner  Reason for Consultation:  Melena  HPI: Morgan Clay is a 72 y.o. female, admitted through the emergency room last evening after being sent by her PCP to the ER.  Patient had presented with complaints of fatigue and onset of melena with 2-3 black stools daily each day since Saturday, 11/03/2017.  Patient did take her Eliquis yesterday morning. She complained of weakness and occasional lightheadedness no syncope chest pain or shortness of breath. Patient had a very recent admission 6/20 through 10/27/2017 also with complaints of melena in setting of Eliquis.  She had undergone EGD with Dr. Fuller Plan on 10/26/2017 with finding of a single 4 mm AVM in the greater curvature of the stomach.  This was treated with APC.  She remained off of Eliquis for 5 days and restarted it on 10/30/2017. She is not on any aspirin or NSAIDs.  She has had no complaints of abdominal pain or nausea. Last colonoscopy was done in 2011 with finding of scattered diverticulosis and one 3 mm hyperplastic polyp was removed. Patient has history of nonischemic cardiomyopathy status post biventricular ICD, atrial fibrillation, coronary artery disease, congestive heart failure, COPD, and hepatitis C.  She has not required transfusions this admission, but did require blood last admission. Hemoglobin on discharge from the hospital 10/25/2017 was up to 10.1.  On arrival yesterday hemoglobin 9.3, hematocrit of 28.3, creatinine 1.87 Hemoglobin down to 8.8 this morning. Patient did have one small melenic stool this morning says this is definitely less than she was having at home.   Past Medical History:  Diagnosis Date  . Arthritis   . Asthma    reports mild asthma since childhood - had COPD on dx list from prior PCP  . Atypical atrial flutter ([place]) 09/28/2015  . Chronic systolic  congestive heart failure ([place])   . DEPRESSION 12/17/2009   Annotation: PHQ-9 score = 14 done on 12/17/2009 Qualifier: Diagnosis of  By: Hassell Done FNP, Tori Milks    . FIBROIDS, UTERUS 03/05/2008  . Glaucoma   . HEPATITIS C - s/p treatment with Harvoni, seeing hepatology, Dawn Drazek 01/04/2007  . Hyperlipemia 12/06/2012  . HYPERTENSION, BENIGN 04/24/2007  . Hypothyroidism   . LBBB (left bundle branch block)   . Nonischemic cardiomyopathy ([place])   . OBESITY 05/27/2009  . OBSTRUCTIVE SLEEP APNEA 11/14/2007   npsg 2009:  Mild osa with AHI 11/hr. Started cpap 2009, quit using due to lack of response Home sleep testing 09/2010 with AHI only 7/hr and weight loss advised by pulmonology.  . OSTEOPENIA 09/30/2008  . Personal history of other infectious and parasitic disease    Hepatitis B    Past Surgical History:  Procedure Laterality Date  . appendectomy    . CARDIAC CATHETERIZATION    . CARDIOVERSION N/A 12/14/2014   Procedure: CARDIOVERSION;  Surgeon: Dorothy Spark, MD;  Location: [facility] ENDOSCOPY;  Service: Cardiovascular;  Laterality: N/A;  . CARDIOVERSION N/A 02/26/2017   Procedure: CARDIOVERSION;  Surgeon: Evans Lance, MD;  Location: [facility] CV LAB;  Service: Cardiovascular;  Laterality: N/A;  . CARDIOVERSION N/A 07/24/2017   Procedure: CARDIOVERSION;  Surgeon: Thayer Headings, MD;  Location: [facility] ENDOSCOPY;  Service: Cardiovascular;  Laterality: N/A;  . EP IMPLANTABLE DEVICE N/A 10/06/2015   Procedure: BIV ICD Generator Changeout;  Surgeon: [name], MD;  Location: [facility] CV LAB;  Service: Cardiovascular;  Laterality: N/A;  .  ESOPHAGOGASTRODUODENOSCOPY N/A 10/26/2017   Procedure: ESOPHAGOGASTRODUODENOSCOPY (EGD);  Surgeon: Ladene Artist, MD;  Location: [facility] ENDOSCOPY;  Service: Endoscopy;  Laterality: N/A;  . HOT HEMOSTASIS N/A 10/26/2017   Procedure: HOT HEMOSTASIS (ARGON PLASMA COAGULATION/BICAP);  Surgeon: Ladene Artist, MD;  Location: [facility] ENDOSCOPY;  Service: Endoscopy;   Laterality: N/A;    Prior to Admission medications   Medication Sig Start Date End Date Taking? Authorizing Provider  albuterol (PROVENTIL HFA;VENTOLIN HFA) 108 (90 BASE) MCG/ACT inhaler Inhale 2 puffs into the lungs every 6 (six) hours as needed for wheezing or shortness of breath. 01/01/15  Yes [name], DO  amiodarone (PACERONE) 200 MG tablet Take 200 mg by mouth at bedtime.    Yes [provider]  BIDIL 20-37.5 MG tablet TAKE 1 TABLET BY MOUTH TWICE DAILY 04/26/17  Yes [name], MD  carvedilol (COREG) 12.5 MG tablet Take 1 tablet (12.5 mg total) by mouth 2 (two) times daily with a meal. 08/17/17  Yes Hosie Poisson, MD  ELIQUIS 5 MG TABS tablet TAKE ONE TABLET BY MOUTH TWICE DAILY 07/11/17  Yes [name], MD  furosemide (LASIX) 40 MG tablet Take 1 tablet (40 mg total) by mouth 2 (two) times daily. 08/17/17  Yes Hosie Poisson, MD  levothyroxine (SYNTHROID, LEVOTHROID) 125 MCG tablet Take 125 mcg by mouth daily before breakfast.   Yes [provider]  nitroGLYCERIN (NITROSTAT) 0.4 MG SL tablet Place 0.4 mg under the tongue every 5 (five) minutes as needed for chest pain.   Yes [provider]  pantoprazole (PROTONIX) 40 MG tablet Take 1 tablet (40 mg total) by mouth daily. 10/26/17  Yes Georgette Shell, MD  sertraline (ZOLOFT) 50 MG tablet Take 1 tablet (50 mg total) by mouth daily. Patient taking differently: Take 50 mg by mouth at bedtime.  06/26/17  Yes [name], DO  spironolactone (ALDACTONE) 25 MG tablet Take 1 tablet (25 mg total) daily by mouth. Patient taking differently: Take 25 mg by mouth at bedtime.  03/20/17  Yes Imogene Burn, PA-C  tiZANidine (ZANAFLEX) 2 MG tablet Take 1 tablet (2 mg total) by mouth at bedtime as needed for muscle spasms. 10/09/17  Yes [name], DO  traMADol (ULTRAM) 50 MG tablet Take 50 mg by mouth at bedtime as needed for severe pain.    Yes [provider]  Hydrocortisone (GERHARDT'S BUTT CREAM)  CREA Apply 1 application topically 2 (two) times daily. Patient not taking: Reported on 11/06/2017 08/17/17   Hosie Poisson, MD  Tetrahydrozoline HCl (VISINE OP) Place 1 drop into both eyes daily as needed (DRY EYES).    [provider]    Current Facility-Administered Medications  Medication Dose Route Frequency Provider Last Rate Last Dose  . acetaminophen (TYLENOL) tablet 650 mg  650 mg Oral Q6H PRN Mariel Aloe, MD       Or  . acetaminophen (TYLENOL) suppository 650 mg  650 mg Rectal Q6H PRN Mariel Aloe, MD      . albuterol (PROVENTIL) (2.5 MG/3ML) 0.083% nebulizer solution 2.5 mg  2.5 mg Nebulization Q6H PRN Mariel Aloe, MD      . amiodarone (PACERONE) tablet 200 mg  200 mg Oral QHS Mariel Aloe, MD   200 mg at 11/06/17 2226  . carvedilol (COREG) tablet 12.5 mg  12.5 mg Oral BID WC Mariel Aloe, MD   12.5 mg at 11/07/17 0829  . famotidine (PEPCID) IVPB 20 mg premix  20  mg Intravenous Q12H Lavina Hamman, MD 100 mL/hr at 11/07/17 0829 20 mg at 11/07/17 0829  . furosemide (LASIX) tablet 40 mg  40 mg Oral BID Mariel Aloe, MD   40 mg at 11/07/17 0829  . insulin aspart (novoLOG) injection 0-9 Units  0-9 Units Subcutaneous TID WC Mariel Aloe, MD      . isosorbide-hydrALAZINE (BIDIL) 20-37.5 MG per tablet 1 tablet  1 tablet Oral BID Mariel Aloe, MD   1 tablet at 11/07/17 [phone]  . levothyroxine (SYNTHROID, LEVOTHROID) injection 62.5 mcg  62.5 mcg Intravenous QAC breakfast Mariel Aloe, MD   62.5 mcg at 11/07/17 0829  . pantoprazole (PROTONIX) EC tablet 40 mg  40 mg Oral BID Lavina Hamman, MD   40 mg at 11/07/17 0829  . sertraline (ZOLOFT) tablet 50 mg  50 mg Oral QHS Mariel Aloe, MD   50 mg at 11/06/17 2225  . tiZANidine (ZANAFLEX) tablet 2 mg  2 mg Oral QHS PRN Mariel Aloe, MD      . traMADol Veatrice Bourbon) tablet 50 mg  50 mg Oral QHS PRN Mariel Aloe, MD        Allergies as of 11/06/2017  . (No Known Allergies)    Family History  Problem  Relation Age of Onset  . Asthma Father   . Heart attack Father   . Asthma Sister   . Cancer Sister        lung  . Heart attack Mother   . Stroke Brother   . Diabetes Unknown     Social History   Socioeconomic History  . Marital status: Single    Spouse name: Not on file  . Number of children: Not on file  . Years of education: Not on file  . Highest education level: Not on file  Occupational History  . Not on file  Social Needs  . Financial resource strain: Not on file  . Food insecurity:    Worry: Not on file    Inability: Not on file  . Transportation needs:    Medical: Not on file    Non-medical: Not on file  Tobacco Use  . Smoking status: Never Smoker  . Smokeless tobacco: Never Used  Substance and Sexual Activity  . Alcohol use: Yes    Comment: 3 glasses of wine 3-4 times per week  . Drug use: No  . Sexual activity: Not on file  Lifestyle  . Physical activity:    Days per week: Not on file    Minutes per session: Not on file  . Stress: Not on file  Relationships  . Social connections:    Talks on phone: Not on file    Gets together: Not on file    Attends religious service: Not on file    Active member of club or organization: Not on file    Attends meetings of clubs or organizations: Not on file    Relationship status: Not on file  . Intimate partner violence:    Fear of current or ex partner: Not on file    Emotionally abused: Not on file    Physically abused: Not on file    Forced sexual activity: Not on file  Other Topics Concern  . Not on file  Social History Narrative   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      Home Situation: lives alone      Spiritual Beliefs: [place]  Lifestyle: no regular exercise; poor diet             Review of Systems: Pertinent positive and negative review of systems were noted in the above HPI section.  All other review of systems was otherwise negative. Physical Exam: Vital signs in  last 24 hours: Temp:  [97.6 F (36.4 C)-99.2 F (37.3 C)] 99.2 F (37.3 C) (07/03 0523) Pulse Rate:  [60-65] 60 (07/03 0523) Resp:  [16-20] 16 (07/03 0523) BP: (120-166)/(48-80) 152/57 (07/03 0523) SpO2:  [98 %-100 %] 98 % (07/03 0523) Weight:  [143 lb 3.2 oz (65 kg)-145 lb 11.2 oz (66.1 kg)] 145 lb 11.2 oz (66.1 kg) (07/02 2000) Last BM Date: 11/06/17 General:   Alert,  Well-developed, well-nourished,AA female  pleasant and cooperative in NAD Head:  Normocephalic and atraumatic. Eyes:  Sclera clear, no icterus.   Conjunctiva pale Ears:  Normal auditory acuity. Nose:  No deformity, discharge,  or lesions. Mouth:  No deformity or lesions.   Neck:  Supple; no masses or thyromegaly. Lungs:  Clear throughout to auscultation.   No wheezes, crackles, or rhonchi. Heart:  Regular rate and rhythm; no murmurs, clicks, rubs,  or gallops.Pacer left chest wall Abdomen:  Soft,nontender, BS active,nonpalp mass or hsm.   Rectal:  Deferred , melena documented Msk:  Symmetrical without gross deformities. . Pulses:  Normal pulses noted. Extremities:  Without clubbing or edema. Neurologic:  Alert and  oriented x4;  grossly normal neurologically. Skin:  Intact without significant lesions or rashes.. Psych:  Alert and cooperative. Normal mood and affect.  Intake/Output from previous day: No intake/output data recorded. Intake/Output this shift: No intake/output data recorded.  Lab Results: Recent Labs    11/06/17 1429 11/06/17 1631 11/07/17 0450  WBC 7.3 8.1 7.4  HGB 9.3* 9.5* 8.8*  HCT 28.3* 29.9* 27.7*  PLT 262.0 259 243   BMET Recent Labs    11/06/17 1429 11/06/17 1631 11/07/17 0450  NA 138 137 141  K 4.3 5.3* 4.0  CL 105 107 107  CO2 25 19* 24  GLUCOSE 92 165* 114*  BUN 47* 51* 52*  CREATININE 1.87* 2.07* 2.10*  CALCIUM 9.3 9.1 9.2   LFT Recent Labs    11/06/17 1631  PROT 8.3*  ALBUMIN 4.0  AST 41  ALT 11  ALKPHOS 60  BILITOT 0.9   PT/INR No results for input(s):  LABPROT, INR in the last 72 hours. Hepatitis Panel No results for input(s): HEPBSAG, HCVAB, HEPAIGM, HEPBIGM in the last 72 hours.   IMPRESSION:  #65 72 year old African-American female admitted with complaints of weakness in setting of 3-day history of melena after restarting her Eliquis a few days prior. Patient had hospital admission 6/20 through 10/27/2017 also with melena had undergone EGD with finding of a single 4 mm AVM along the greater curve of the stomach which was treated with APC.  Suspect she may have developed an ulcer at prior APC site, also consider other previously nonvisualized AVMs either gastric or small bowel.  #2 normocytic anemia secondary to above #3 atrial fibrillation #4 coronary artery disease-nonischemic cardiomyopathy #5 congestive heart failure #6 status post biventricular ICD #7 COPD   Plan; Keep n.p.o. Continue every 8 hour hemoglobins and transfuse for hemoglobin 8 or less Hold Eliquis Patient is currently on IV Pepcid and p.o. Protonix, will continue until after EGD this afternoon Patient has been scheduled for upper endoscopy with Dr. Ardis Hughs this afternoon.  Procedure was discussed in detail with the patient including indications risks and  benefits and she is agreeable to proceed If EGD unrevealing will need to consider colonoscopy and/or capsule endoscopy.      [name] Esterwood  11/07/2017, 9:58 AM  ________________________________________________________________________  Velora Heckler GI MD note:  I personally examined the patient, reviewed the data and agree with the assessment and plan described above.  Planning on  EGD today.   Owens Loffler, MD [facility] Gastroenterology Pager [phone]

## 2017-11-07 NOTE — Anesthesia Preprocedure Evaluation (Signed)
Anesthesia Evaluation  Patient identified by MRN, date of birth, ID band Patient awake    Reviewed: Allergy & Precautions, NPO status , Patient's Chart, lab work & pertinent test results, reviewed documented beta blocker date and time   Airway Mallampati: II  TM Distance: >3 FB Neck ROM: Full    Dental  (+) Lower Dentures, Partial Upper, Caps   Pulmonary asthma , sleep apnea and Continuous Positive Airway Pressure Ventilation , COPD,  COPD inhaler,    Pulmonary exam normal breath sounds clear to auscultation       Cardiovascular hypertension, Pt. on medications and Pt. on home beta blockers + Peripheral Vascular Disease and +CHF  Normal cardiovascular exam+ dysrhythmias Atrial Fibrillation + pacemaker + Cardiac Defibrillator  Rhythm:Regular Rate:Normal  EKG 11/06/2017- atrial sensed ventricular paced rhythm Echo- 4/6/2019Left ventricle: The cavity size was normal. Wall thickness was  normal. Systolic function was normal. The estimated ejection  fraction was in the range of 60% to 65%. Wall motion was normal;  there were no regional wall motion abnormalities. Features are  consistent with a pseudonormal left ventricular filling pattern,  with concomitant abnormal relaxation and increased filling  pressure (grade 2 diastolic dysfunction). - Aortic valve: There was moderate regurgitation. - Mitral valve: There was moderate regurgitation. - Left atrium: The atrium was mildly dilated. - Right atrium: The atrium was mildly dilated. - Tricuspid valve: There was moderate regurgitation. - Pulmonary arteries: Systolic pressure was moderately increased.  PA peak pressure: 49 mm Hg (S).   Neuro/Psych PSYCHIATRIC DISORDERS Depression negative neurological ROS     GI/Hepatic Neg liver ROS, Melena   Endo/Other  diabetes, Well Controlled, Type 2, Oral Hypoglycemic AgentsHypothyroidism   Renal/GU Renal InsufficiencyRenal disease  negative  genitourinary   Musculoskeletal  (+) Arthritis ,   Abdominal   Peds  Hematology  (+) anemia , Eliquis- last dose 11/06/2017   Anesthesia Other Findings   Reproductive/Obstetrics                             Anesthesia Physical Anesthesia Plan  ASA: III  Anesthesia Plan: MAC   Post-op Pain Management:    Induction: Intravenous  PONV Risk Score and Plan: 2 and Propofol infusion, Ondansetron and Treatment may vary due to age or medical condition  Airway Management Planned: Natural Airway and Nasal Cannula  Additional Equipment:   Intra-op Plan:   Post-operative Plan:   Informed Consent: I have reviewed the patients History and Physical, chart, labs and discussed the procedure including the risks, benefits and alternatives for the proposed anesthesia with the patient or authorized representative who has indicated his/her understanding and acceptance.   Dental advisory given  Plan Discussed with: CRNA and Surgeon  Anesthesia Plan Comments:         Anesthesia Quick Evaluation

## 2017-11-07 NOTE — Care Management Obs Status (Signed)
[place] NOTIFICATION   Patient Details  Name: Hajra Port MRN: [id number] Date of Birth: 09/11/1945   Medicare Observation Status Notification Given:  Yes    Lynnell Catalan, RN 11/07/2017, 1:03 PM

## 2017-11-08 ENCOUNTER — Encounter (HOSPITAL_COMMUNITY)
Admission: EM | Disposition: A | Discharge status: Home / Self Care | Payer: Self-pay | Source: Home / Self Care | Attending: Emergency Medicine

## 2017-11-08 ENCOUNTER — Encounter (HOSPITAL_COMMUNITY): Payer: Self-pay | Admitting: *Deleted

## 2017-11-08 DIAGNOSIS — K635 Polyp of colon: Secondary | ICD-10-CM

## 2017-11-08 DIAGNOSIS — D123 Benign neoplasm of transverse colon: Secondary | ICD-10-CM | POA: Diagnosis not present

## 2017-11-08 DIAGNOSIS — K5730 Diverticulosis of large intestine without perforation or abscess without bleeding: Secondary | ICD-10-CM

## 2017-11-08 DIAGNOSIS — Z7901 Long term (current) use of anticoagulants: Secondary | ICD-10-CM | POA: Diagnosis not present

## 2017-11-08 DIAGNOSIS — K922 Gastrointestinal hemorrhage, unspecified: Secondary | ICD-10-CM | POA: Diagnosis not present

## 2017-11-08 HISTORY — PX: POLYPECTOMY: SHX5525

## 2017-11-08 HISTORY — PX: COLONOSCOPY: SHX5424

## 2017-11-08 LAB — GLUCOSE, CAPILLARY: Glucose-Capillary: 123 mg/dL — ABNORMAL HIGH (ref 70–99)

## 2017-11-08 LAB — CBC
HCT: 29.1 % — ABNORMAL LOW (ref 36.0–46.0)
Hemoglobin: 9.4 g/dL — ABNORMAL LOW (ref 12.0–15.0)
MCH: 29 pg (ref 26.0–34.0)
MCHC: 32.3 g/dL (ref 30.0–36.0)
MCV: 89.8 fL (ref 78.0–100.0)
PLATELETS: 250 10*3/uL (ref 150–400)
RBC: 3.24 MIL/uL — AB (ref 3.87–5.11)
RDW: 15.4 % (ref 11.5–15.5)
WBC: 6.6 10*3/uL (ref 4.0–10.5)

## 2017-11-08 LAB — BASIC METABOLIC PANEL
Anion gap: 12 (ref 5–15)
BUN: 44 mg/dL — ABNORMAL HIGH (ref 8–23)
CHLORIDE: 108 mmol/L (ref 98–111)
CO2: 22 mmol/L (ref 22–32)
Calcium: 9.5 mg/dL (ref 8.9–10.3)
Creatinine, Ser: 2.13 mg/dL — ABNORMAL HIGH (ref 0.44–1.00)
GFR calc non Af Amer: 23 mL/min — ABNORMAL LOW (ref >60)
GFR, EST AFRICAN AMERICAN: 26 mL/min — AB (ref >60)
Glucose, Bld: 94 mg/dL (ref 70–99)
POTASSIUM: 3.6 mmol/L (ref 3.5–5.1)
Sodium: 142 mmol/L (ref 135–145)

## 2017-11-08 LAB — MAGNESIUM: MAGNESIUM: 2 mg/dL (ref 1.7–2.4)

## 2017-11-08 SURGERY — COLONOSCOPY WITH PROPOFOL
Anesthesia: Monitor Anesthesia Care

## 2017-11-08 SURGERY — COLONOSCOPY
Anesthesia: Moderate Sedation

## 2017-11-08 MED ORDER — SPIRONOLACTONE 25 MG PO TABS: 12.5000 mg | ORAL_TABLET | Freq: Every day | ORAL | 3 refills | Status: DC

## 2017-11-08 MED ORDER — MIDAZOLAM HCL 5 MG/5ML IJ SOLN
INTRAMUSCULAR | Status: DC | PRN
  Administered 2017-11-08: 1 mg via INTRAVENOUS
  Administered 2017-11-08 (×2): 2 mg via INTRAVENOUS

## 2017-11-08 MED ORDER — EPINEPHRINE PF 1 MG/10ML IJ SOSY
PREFILLED_SYRINGE | INTRAMUSCULAR | Status: AC
  Filled 2017-11-08: qty 10

## 2017-11-08 MED ORDER — MIDAZOLAM HCL 5 MG/ML IJ SOLN
INTRAMUSCULAR | Status: AC
  Filled 2017-11-08: qty 1

## 2017-11-08 MED ORDER — FENTANYL CITRATE (PF) 100 MCG/2ML IJ SOLN
INTRAMUSCULAR | Status: DC | PRN
  Administered 2017-11-08 (×2): 25 ug via INTRAVENOUS

## 2017-11-08 MED ORDER — DIPHENHYDRAMINE HCL 50 MG/ML IJ SOLN
INTRAMUSCULAR | Status: AC
  Filled 2017-11-08: qty 1

## 2017-11-08 MED ORDER — FENTANYL CITRATE (PF) 100 MCG/2ML IJ SOLN
INTRAMUSCULAR | Status: AC
  Filled 2017-11-08: qty 2

## 2017-11-08 MED ORDER — SPOT INK MARKER SYRINGE KIT
PACK | SUBMUCOSAL | Status: AC
  Filled 2017-11-08: qty 5

## 2017-11-08 MED ORDER — MIDAZOLAM HCL 5 MG/ML IJ SOLN
INTRAMUSCULAR | Status: AC
  Filled 2017-11-08: qty 2

## 2017-11-08 NOTE — Discharge Summary (Signed)
[facility] Discharge Summary   Patient: Morgan Clay WJX:[id number]   PCP: [name], DO DOB: 18-Dec-1945   Date of admission: 11/06/2017   Date of discharge: 11/08/2017    Discharge Diagnoses:  Active Problems:   Chronic diastolic CHF (congestive heart failure) (HCC)   Biventricular ICD (implantable cardioverter-defibrillator) in place   Type 2 diabetes, controlled, with renal manifestation ([place])   Chronic kidney disease - followed by [facility]   Aortic atherosclerosis ([place])   Acute GI bleeding   Anemia   Atrial fibrillation, chronic (HCC)   Melena   Polyp of transverse colon   Diverticulosis of colon without hemorrhage   Admitted From: home Disposition:  home  Recommendations for Outpatient Follow-up:  1. Please follow up with PCP in 1 week   Follow-up Information    [name], DO. Schedule an appointment as soon as possible for a visit in 1 week(s).   Specialty:  Family Medicine Contact information: [address] [place] [address] [phone]        Ladene Artist, MD. Schedule an appointment as soon as possible for a visit in 1 month(s).   Specialty:  Gastroenterology Why:  with CBC Contact information: [address] [place] [address] [phone]          Diet recommendation: cardiac diet  Activity: The patient is advised to gradually reintroduce usual activities.  Discharge Condition: good  Code Status: full code  History of present illness: As per the H and P dictated on admission, " Morgan Clay is a 72 y.o. female with medical history significant of CAD page 4, atrial fibrillation, chronic diastolic heart failure, status post biventricular ICD, melena, angiodysplastic lesion of the stomach, asthma/COPD.  Patient presented from her PCPs office today.  She sought medical attention because she was feeling tired.  She reports restarting her Eliquis after her EGD with subsequent resumption of melanotic  stools.  Her last melanotic stool was this morning.  She last took her Eliquis this morning as well.  Her melanotic stools have remained stable."  Hospital Course:  Summary of her active problems in the hospital is as following. 1.  Symptomatic anemia Melena. AVM S/P APC Underwent EGD on 11/07/2017 small ulcer clean based otherwise normal endoscopic exam. H&H has changed from 9.3 on admission to 9.1. Since the patient is to continue anticoagulation and has recurrent presentation with GI bleeding, GI recommends patient is to undergo colonoscopy for further evaluation. Colonoscopy was negative for any active bleeding Gastroenterology recommended pt can be discharge today from GI standpoint. Also recommended OK to resume her eliquis today. Pt will follow up with gastroenterology   2.  Chronic A. fib. S/P by biV AICD placement. CAD. Nonischemic cardiomyopathy. Chronic systolic and diastolic CHF. Cardioversion October 2018. Home medication include amiodarone 200 mg, BiDil 1 twice daily, Coreg 12.5 twice daily, Eliquis 5 twice daily, Lasix 40 twice daily, Aldactone 25 daily. Currently holding Aldactone due to hyperkalemia. Will reduce the dose on discharge to half.  Also holding Eliquis given GI bleed. Resume on discharge   3.  Chronic kidney disease stage IV. Serum creatinine around baseline.  Monitor.  4.  Mild hyperkalemia. Aldactone is currently on hold, potassium now better.  Monitor.  5.  Type 2 diabetes mellitus. Controlled hemoglobin A1c 6.8 in June 05, 2016. Not on medication. Diet controlled.  6.  COPD-asthma-OSA. PRN albuterol, monitor oxygen.  All other chronic medical condition were stable during the hospitalization.  Patient was ambulatory  without any assistance. On the day of the discharge the patient's vitals were stable , and no other acute medical condition were reported by patient. the patient was felt safe to be discharge at home with  family.  Consultants: gastroenterology  Procedures:  EGD Impression:  There was a small ulcer at the site of the recent AVM APC. I am not really certain if this ulcer (or the previous gastric AVM) are the culprits here. She has not had a colonoscopy in 8 years and since she is currently off her blood thinner eliquis, I think it is safest to repeat that while she is in the hospital.   Colonoscopy Impression: - Diverticulosis in the entire examined colon. - One 4 mm polyp in the transverse colon, removed with a cold snare. Resected and retrieved. - The examination was otherwise normal on direct and retroflexion views. - The melena, anemia may indeed have been related to the previous gastric AVM which was treated 2 weeks ago (Dr. Fuller Plan EGD), the site was ulcerated on repeat EGD yesterday.  DISCHARGE MEDICATION: Allergies as of 11/08/2017   No Known Allergies     Medication List    TAKE these medications   albuterol 108 (90 Base) MCG/ACT inhaler Commonly known as:  PROVENTIL HFA;VENTOLIN HFA Inhale 2 puffs into the lungs every 6 (six) hours as needed for wheezing or shortness of breath.   amiodarone 200 MG tablet Commonly known as:  PACERONE Take 200 mg by mouth at bedtime.   BIDIL 20-37.5 MG tablet Generic drug:  isosorbide-hydrALAZINE TAKE 1 TABLET BY MOUTH TWICE DAILY   carvedilol 12.5 MG tablet Commonly known as:  COREG Take 1 tablet (12.5 mg total) by mouth 2 (two) times daily with a meal.   ELIQUIS 5 MG Tabs tablet Generic drug:  apixaban TAKE ONE TABLET BY MOUTH TWICE DAILY   furosemide 40 MG tablet Commonly known as:  LASIX Take 1 tablet (40 mg total) by mouth 2 (two) times daily.   Gerhardt's butt cream Crea Apply 1 application topically 2 (two) times daily.   levothyroxine 125 MCG tablet Commonly known as:  SYNTHROID, LEVOTHROID Take 125 mcg by mouth daily before breakfast.   nitroGLYCERIN 0.4 MG SL tablet Commonly known as:  NITROSTAT Place 0.4 mg under the  tongue every 5 (five) minutes as needed for chest pain.   pantoprazole 40 MG tablet Commonly known as:  PROTONIX Take 1 tablet (40 mg total) by mouth daily.   sertraline 50 MG tablet Commonly known as:  ZOLOFT Take 1 tablet (50 mg total) by mouth daily. What changed:  when to take this   spironolactone 25 MG tablet Commonly known as:  ALDACTONE Take 0.5 tablets (12.5 mg total) by mouth daily. What changed:  how much to take   tiZANidine 2 MG tablet Commonly known as:  ZANAFLEX Take 1 tablet (2 mg total) by mouth at bedtime as needed for muscle spasms.   traMADol 50 MG tablet Commonly known as:  ULTRAM Take 50 mg by mouth at bedtime as needed for severe pain.   VISINE OP Place 1 drop into both eyes daily as needed (DRY EYES).      No Known Allergies Discharge Instructions    Diet - low sodium heart healthy   Complete by:  As directed    Discharge instructions   Complete by:  As directed    It is important that you read following instructions as well as go over your medication list with RN to help  you understand your care after this hospitalization.  Discharge Instructions: Please follow-up with PCP in one week  Please request your primary care physician to go over all Hospital Tests and Procedure/Radiological results at the follow up,  Please get all Hospital records sent to your PCP by signing hospital release before you go home.   Do not take more than prescribed Pain, Sleep and Anxiety Medications. You were cared for by a hospitalist during your hospital stay. If you have any questions about your discharge medications or the care you received while you were in the hospital after you are discharged, you can call the unit and ask to speak with the hospitalist on call if the hospitalist that took care of you is not available.  Once you are discharged, your primary care physician will handle any further medical issues. Please note that NO REFILLS for any discharge  medications will be authorized once you are discharged, as it is imperative that you return to your primary care physician (or establish a relationship with a primary care physician if you do not have one) for your aftercare needs so that they can reassess your need for medications and monitor your lab values. You Must read complete instructions/literature along with all the possible adverse reactions/side effects for all the Medicines you take and that have been prescribed to you. Take any new Medicines after you have completely understood and accept all the possible adverse reactions/side effects. Wear Seat belts while driving. If you have smoked or chewed Tobacco in the last 2 yrs please stop smoking and/or stop any Recreational drug use.   Increase activity slowly   Complete by:  As directed      Discharge Exam: Filed Weights   11/06/17 2000 11/07/17 1320  Weight: 66.1 kg (145 lb 11.2 oz) 66.1 kg (145 lb 11.2 oz)   Vitals:   11/08/17 0930 11/08/17 0940  BP: (!) 126/48 (!) 151/50  Pulse: 60 (!) 59  Resp: 11 18  Temp:    SpO2: 99% 98%   General: Appear in no distress, no Rash; Oral Mucosa moist. Cardiovascular: S1 and S2 Present, no Murmur, no JVD Respiratory: Bilateral Air entry present and Clear to Auscultation, no Crackles, no wheezes Abdomen: Bowel Sound present, Soft and no tenderness Extremities: no Pedal edema, no calf tenderness Neurology: Grossly no focal neuro deficit.  The results of significant diagnostics from this hospitalization (including imaging, microbiology, ancillary and laboratory) are listed below for reference.    Significant Diagnostic Studies: No results found.  Microbiology: No results found for this or any previous visit (from the past 240 hour(s)).   Labs: CBC: Recent Labs  Lab 11/06/17 1429 11/06/17 1631 11/07/17 0450 11/07/17 1016 11/08/17 0658  WBC 7.3 8.1 7.4  --  6.6  HGB 9.3* 9.5* 8.8* 9.1* 9.4*  HCT 28.3* 29.9* 27.7* 28.5* 29.1*   MCV 88.4 89.5 89.4  --  89.8  PLT 262.0 259 243  --  627   Basic Metabolic Panel: Recent Labs  Lab 11/06/17 1429 11/06/17 1631 11/07/17 0450 11/08/17 0506  NA 138 137 141 142  K 4.3 5.3* 4.0 3.6  CL 105 107 107 108  CO2 25 19* 24 22  GLUCOSE 92 165* 114* 94  BUN 47* 51* 52* 44*  CREATININE 1.87* 2.07* 2.10* 2.13*  CALCIUM 9.3 9.1 9.2 9.5  MG  --   --   --  2.0   Liver Function Tests: Recent Labs  Lab 11/06/17 1631  AST 41  ALT  11  ALKPHOS 60  BILITOT 0.9  PROT 8.3*  ALBUMIN 4.0   BNP (last 3 results) Recent Labs    02/22/17 2126 07/31/17 1229  BNP 1,275.8* 547.9*   CBG: Recent Labs  Lab 11/07/17 0746 11/07/17 1142 11/07/17 1607 11/07/17 2149 11/08/17 0736  GLUCAP 110* 110* 180* 113* 123*   Time spent: 35 minutes  Signed:  Berle Mull  [facility] 11/08/2017 , 3:00 PM

## 2017-11-08 NOTE — Interval H&P Note (Signed)
History and Physical Interval Note:  11/08/2017 8:52 AM  Morgan Clay  has presented today for surgery, with the diagnosis of gi bleed  The various methods of treatment have been discussed with the patient and family. After consideration of risks, benefits and other options for treatment, the patient has consented to  Procedure(s): COLONOSCOPY (N/A) as a surgical intervention .  The patient's history has been reviewed, patient examined, no change in status, stable for surgery.  I have reviewed the patient's chart and labs.  Questions were answered to the patient's satisfaction.     Milus Banister

## 2017-11-08 NOTE — Op Note (Signed)
[facility] Patient Name: Morgan Clay Procedure Date: 11/08/2017 MRN: [id number] Attending MD: Milus Banister , MD Date of Birth: 06/25/1945 CSN: [id number] Age: 72 Admit Type: Inpatient Procedure:                Colonoscopy Indications:              Melena Providers:                Milus Banister, MD, Vista Lawman, RN, William Dalton, Technician Referring MD:              Medicines:                Fentanyl 50 micrograms IV, Midazolam 5 mg IV Complications:            No immediate complications. Estimated blood loss:                            None. Estimated Blood Loss:     Estimated blood loss: none. Procedure:                Pre-Anesthesia Assessment:                           - Prior to the procedure, a History and Physical                            was performed, and patient medications and                            allergies were reviewed. The patient's tolerance of                            previous anesthesia was also reviewed. The risks                            and benefits of the procedure and the sedation                            options and risks were discussed with the patient.                            All questions were answered, and informed consent                            was obtained. Prior Anticoagulants: The patient has                            taken Eliquis (apixaban), last dose was 2 days                            prior to procedure. ASA Grade Assessment: III - A  patient with severe systemic disease. After                            reviewing the risks and benefits, the patient was                            deemed in satisfactory condition to undergo the                            procedure.                           After obtaining informed consent, the colonoscope                            was passed under direct vision. Throughout the   procedure, the patient's blood pressure, pulse, and                            oxygen saturations were monitored continuously. The                            EC-[id number] ([id number]) scope was introduced through                            the anus and advanced to the the cecum, identified                            by appendiceal orifice and ileocecal valve. The                            colonoscopy was performed without difficulty. The                            patient tolerated the procedure well. The quality                            of the bowel preparation was excellent. The                            ileocecal valve, appendiceal orifice, and rectum                            were photographed. Scope In: 9:07:10 AM Scope Out: 9:17:39 AM Scope Withdrawal Time: 0 hours 7 minutes 19 seconds  Total Procedure Duration: 0 hours 10 minutes 29 seconds  Findings:      Multiple small and large-mouthed diverticula were found in the entire       colon.      A 4 mm polyp was found in the transverse colon. The polyp was sessile.       The polyp was removed with a cold snare. Resection and retrieval were       complete.      The exam was otherwise without abnormality on direct and retroflexion       views. Impression:               -  Diverticulosis in the entire examined colon.                           - One 4 mm polyp in the transverse colon, removed                            with a cold snare. Resected and retrieved.                           - The examination was otherwise normal on direct                            and retroflexion views.                           - The melena, anemia may indeed have been related                            to the previous gastric AVM which was treated 2                            weeks ago (Dr. Fuller Plan EGD), the site was ulcerated                            on repeat EGD yesterday. Moderate Sedation:      Moderate (conscious) sedation was administered by the  endoscopy nurse       and supervised by the endoscopist. The patient's oxygen saturation,       heart rate, blood pressure and response to care were monitored. Total       physician intraservice time was 15 minutes. Recommendation:           - Return patient to hospital ward. OK for discharge                            today from GI standpoint.                           - OK to resume her eliquis today.                           - [facility] will contact her about follow up appt                            in 3-4 weeks with cbc the day prior. If there is                            concern for further bleeding, the next step in                            workup is small bowel capsule endoscopy.                           - Please start once daily iron supplement (OTC) for  now. Procedure Code(s):        --- Professional ---                           [phone], Colonoscopy, flexible; with removal of                            tumor(s), polyp(s), or other lesion(s) by snare                            technique                           G0500, Moderate sedation services provided by the                            same physician or other qualified health care                            professional performing a gastrointestinal                            endoscopic service that sedation supports,                            requiring the presence of an independent trained                            observer to assist in the monitoring of the                            patient's level of consciousness and physiological                            status; initial 15 minutes of intra-service time;                            patient age 37 years or older (additional time may                            be reported with [phone], as appropriate) Diagnosis Code(s):        --- Professional ---                           D12.3, Benign neoplasm of transverse colon (hepatic                             flexure or splenic flexure)                           K92.1, Melena (includes Hematochezia)                           K57.30, Diverticulosis of large intestine without  perforation or abscess without bleeding CPT copyright 2017 American Medical Association. All rights reserved. The codes documented in this report are preliminary and upon coder review may  be revised to meet current compliance requirements. Milus Banister, MD 11/08/2017 9:22:02 AM This report has been signed electronically. Number of Addenda: 0

## 2017-11-09 ENCOUNTER — Telehealth: Payer: Self-pay

## 2017-11-09 ENCOUNTER — Encounter (HOSPITAL_COMMUNITY): Payer: Self-pay | Admitting: Gastroenterology

## 2017-11-09 NOTE — Telephone Encounter (Signed)
I left her a message asking her to call the office to schedule an appointment with Dr Carlean Purl or an APP.

## 2017-11-09 NOTE — Telephone Encounter (Signed)
-----   Message from Milus Banister, MD sent at 11/08/2017  9:22 AM EDT ----- She needs rov with Carlean Purl or extender in 3-4 weeks with a CBC the day prior.  Can you remind her to take OTC iron supplement for now as well.  Thanks

## 2017-11-13 ENCOUNTER — Ambulatory Visit (INDEPENDENT_AMBULATORY_CARE_PROVIDER_SITE_OTHER): Payer: Self-pay

## 2017-11-13 ENCOUNTER — Other Ambulatory Visit: Payer: Self-pay | Admitting: Family Medicine

## 2017-11-13 DIAGNOSIS — I5022 Chronic systolic (congestive) heart failure: Secondary | ICD-10-CM

## 2017-11-13 DIAGNOSIS — Z95810 Presence of automatic (implantable) cardiac defibrillator: Secondary | ICD-10-CM

## 2017-11-13 NOTE — Progress Notes (Signed)
EPIC Encounter for ICM Monitoring  Patient Name: Morgan Clay is a 72 y.o. female Date: 11/13/2017 Primary Care Physican: [name], DO [facility] Electrophysiologist: Caryl Comes Nephrologist: [name] Dry Weight:140lbs (Baseline135-140lbs) Bi-V Pacing: 100%       Heart Failure questions reviewed, pt asymptomatic.  She said she is feeling much better hospital discharge on 11/08/2017.  Hospitalization for GI bleed 7/2-7/4 and 6/20 for anemia   Thoracic impedance returned to normal since last ICM transmission on 11/05/2017.   Prescribed dosage: Furosemide 40 mg 1tablet(40 mg total)twice a day.   Labs: 10/26/2017 Creatinine 2.11, BUN 52, Potassium 4.6, Sodium 141 10/25/2017 Creatinine 2.70, BUN 77, Potassium 4.7, Sodium 139  08/30/2017 Creatinine 1.96, BUN 28, Potassium 4.2, Sodium 141, EGFR 32.46  08/17/2017 Creatinine 1.62, BUN 34, Potassium 4.1, Sodium 136  08/16/2017 Creatinine 1.69, BUN 34, Potassium 3.3, Sodium 137  08/15/2017 Creatinine 1.69, BUN 38, Potassium 3.2, Sodium 136  08/14/2017 Creatinine 1.80, BUN 38, Potassium 4.3, Sodium 139  08/12/2017 Creatinine 2.31, BUN 41, Potassium 4.7, Sodium 135  A complete set of results can be found in Results Review.  Recommendations: No changes.  Encouraged to call for fluid symptoms.  Follow-up plan: ICM clinic phone appointment on 12/06/2017.   Office appointment scheduled 12/24/2017 with [name], PA.    Copy of ICM check sent to Dr. Caryl Comes.   3 month ICM trend: 11/13/2017    1 Year ICM trend:       [name], RN 11/13/2017 11:44 AM

## 2017-11-15 ENCOUNTER — Other Ambulatory Visit: Payer: Self-pay | Admitting: Family Medicine

## 2017-11-15 DIAGNOSIS — E038 Other specified hypothyroidism: Secondary | ICD-10-CM

## 2017-11-15 LAB — CUP PACEART REMOTE DEVICE CHECK
Battery Remaining Longevity: 65 mo
Battery Voltage: 2.99 V
Brady Statistic AP VP Percent: 55.19 %
Brady Statistic AS VP Percent: 44.8 %
Brady Statistic AS VS Percent: 0 %
Brady Statistic RV Percent Paced: 99.98 %
HIGH POWER IMPEDANCE MEASURED VALUE: 43 Ohm
HighPow Impedance: 65 Ohm
Implantable Lead Implant Date: 20080818
Implantable Lead Location: 753858
Implantable Lead Location: 753860
Implantable Lead Model: 4193
Implantable Lead Model: 5076
Implantable Lead Model: 6947
Lead Channel Impedance Value: 342 Ohm
Lead Channel Impedance Value: 4047 Ohm
Lead Channel Impedance Value: 437 Ohm
Lead Channel Pacing Threshold Amplitude: 0.875 V
Lead Channel Pacing Threshold Amplitude: 1 V
Lead Channel Pacing Threshold Amplitude: 1.125 V
Lead Channel Pacing Threshold Pulse Width: 0.4 ms
Lead Channel Pacing Threshold Pulse Width: 0.4 ms
Lead Channel Sensing Intrinsic Amplitude: 10.875 mV
Lead Channel Sensing Intrinsic Amplitude: 3.875 mV
Lead Channel Setting Sensing Sensitivity: 0.3 mV
MDC IDC LEAD IMPLANT DT: 20080818
MDC IDC LEAD IMPLANT DT: 20080818
MDC IDC LEAD LOCATION: 753859
MDC IDC MSMT LEADCHNL LV IMPEDANCE VALUE: 4047 Ohm
MDC IDC MSMT LEADCHNL RA PACING THRESHOLD PULSEWIDTH: 0.4 ms
MDC IDC MSMT LEADCHNL RA SENSING INTR AMPL: 3.875 mV
MDC IDC MSMT LEADCHNL RV IMPEDANCE VALUE: 266 Ohm
MDC IDC MSMT LEADCHNL RV IMPEDANCE VALUE: 342 Ohm
MDC IDC MSMT LEADCHNL RV SENSING INTR AMPL: 10.875 mV
MDC IDC PG IMPLANT DT: 20170531
MDC IDC SESS DTM: 20190701082304
MDC IDC SET LEADCHNL LV PACING AMPLITUDE: 1.75 V
MDC IDC SET LEADCHNL LV PACING PULSEWIDTH: 0.4 ms
MDC IDC SET LEADCHNL RA PACING AMPLITUDE: 1.75 V
MDC IDC SET LEADCHNL RV PACING AMPLITUDE: 2.5 V
MDC IDC SET LEADCHNL RV PACING PULSEWIDTH: 0.4 ms
MDC IDC STAT BRADY AP VS PERCENT: 0.01 %
MDC IDC STAT BRADY RA PERCENT PACED: 55.2 %

## 2017-11-15 NOTE — Telephone Encounter (Signed)
Ok to refill. Can you schedule TSH check for her? Or if has visit coming up can check then.

## 2017-11-15 NOTE — Telephone Encounter (Signed)
I called the pt and scheduled a lab appt for 7/15.  She is aware the Rx was sent to her pharmacy and requested a 30-day supply in case the dose is changed after results given.

## 2017-11-19 ENCOUNTER — Other Ambulatory Visit (INDEPENDENT_AMBULATORY_CARE_PROVIDER_SITE_OTHER): Payer: Medicare Other

## 2017-11-19 ENCOUNTER — Ambulatory Visit: Payer: Medicare Other | Admitting: Gastroenterology

## 2017-11-19 DIAGNOSIS — E038 Other specified hypothyroidism: Secondary | ICD-10-CM

## 2017-11-19 LAB — TSH: TSH: 2.09 u[IU]/mL (ref 0.35–4.50)

## 2017-11-20 MED ORDER — LEVOTHYROXINE SODIUM 125 MCG PO TABS: 125.0000 ug | ORAL_TABLET | Freq: Every day | ORAL | 1 refills | Status: DC

## 2017-11-20 NOTE — Addendum Note (Signed)
Addended by: Agnes Lawrence on: 11/20/2017 11:56 AM   Modules accepted: Orders

## 2017-11-23 ENCOUNTER — Inpatient Hospital Stay: Payer: Medicare Other | Attending: Oncology | Admitting: Oncology

## 2017-11-23 ENCOUNTER — Inpatient Hospital Stay: Payer: Medicare Other

## 2017-11-23 ENCOUNTER — Telehealth: Payer: Self-pay | Admitting: *Deleted

## 2017-11-23 ENCOUNTER — Telehealth: Payer: Self-pay | Admitting: Oncology

## 2017-11-23 VITALS — BP 121/50 | HR 60 | Temp 98.0°F | Resp 17 | Ht 59.0 in | Wt 143.8 lb

## 2017-11-23 DIAGNOSIS — I5022 Chronic systolic (congestive) heart failure: Secondary | ICD-10-CM | POA: Insufficient documentation

## 2017-11-23 DIAGNOSIS — E669 Obesity, unspecified: Secondary | ICD-10-CM

## 2017-11-23 DIAGNOSIS — K921 Melena: Secondary | ICD-10-CM | POA: Insufficient documentation

## 2017-11-23 DIAGNOSIS — F329 Major depressive disorder, single episode, unspecified: Secondary | ICD-10-CM | POA: Diagnosis not present

## 2017-11-23 DIAGNOSIS — J45909 Unspecified asthma, uncomplicated: Secondary | ICD-10-CM | POA: Diagnosis not present

## 2017-11-23 DIAGNOSIS — I509 Heart failure, unspecified: Secondary | ICD-10-CM

## 2017-11-23 DIAGNOSIS — I484 Atypical atrial flutter: Secondary | ICD-10-CM | POA: Insufficient documentation

## 2017-11-23 DIAGNOSIS — E039 Hypothyroidism, unspecified: Secondary | ICD-10-CM

## 2017-11-23 DIAGNOSIS — I110 Hypertensive heart disease with heart failure: Secondary | ICD-10-CM | POA: Insufficient documentation

## 2017-11-23 DIAGNOSIS — B1920 Unspecified viral hepatitis C without hepatic coma: Secondary | ICD-10-CM | POA: Diagnosis not present

## 2017-11-23 DIAGNOSIS — Z79899 Other long term (current) drug therapy: Secondary | ICD-10-CM | POA: Insufficient documentation

## 2017-11-23 DIAGNOSIS — Z7901 Long term (current) use of anticoagulants: Secondary | ICD-10-CM | POA: Insufficient documentation

## 2017-11-23 DIAGNOSIS — D649 Anemia, unspecified: Secondary | ICD-10-CM | POA: Diagnosis not present

## 2017-11-23 DIAGNOSIS — G4733 Obstructive sleep apnea (adult) (pediatric): Secondary | ICD-10-CM

## 2017-11-23 DIAGNOSIS — I429 Cardiomyopathy, unspecified: Secondary | ICD-10-CM | POA: Diagnosis not present

## 2017-11-23 DIAGNOSIS — E785 Hyperlipidemia, unspecified: Secondary | ICD-10-CM | POA: Diagnosis not present

## 2017-11-23 LAB — CBC WITH DIFFERENTIAL (CANCER CENTER ONLY)
BASOS PCT: 0 %
Basophils Absolute: 0 10*3/uL (ref 0.0–0.1)
Eosinophils Absolute: 0.1 10*3/uL (ref 0.0–0.5)
Eosinophils Relative: 2 %
HEMATOCRIT: 31 % — AB (ref 34.8–46.6)
Hemoglobin: 9.9 g/dL — ABNORMAL LOW (ref 11.6–15.9)
LYMPHS PCT: 21 %
Lymphs Abs: 1.5 10*3/uL (ref 0.9–3.3)
MCH: 28.7 pg (ref 25.1–34.0)
MCHC: 31.9 g/dL (ref 31.5–36.0)
MCV: 89.9 fL (ref 79.5–101.0)
Monocytes Absolute: 0.5 10*3/uL (ref 0.1–0.9)
Monocytes Relative: 7 %
NEUTROS ABS: 5 10*3/uL (ref 1.5–6.5)
NEUTROS PCT: 70 %
PLATELETS: 208 10*3/uL (ref 145–400)
RBC: 3.45 MIL/uL — ABNORMAL LOW (ref 3.70–5.45)
RDW: 15.5 % — AB (ref 11.2–14.5)
WBC: 7.2 10*3/uL (ref 3.9–10.3)

## 2017-11-23 LAB — FERRITIN: FERRITIN: 149 ng/mL (ref 11–307)

## 2017-11-23 LAB — IRON AND TIBC
Iron: 46 ug/dL (ref 41–142)
Saturation Ratios: 12 % — ABNORMAL LOW (ref 21–57)
TIBC: 376 ug/dL (ref 236–444)
UIBC: 330 ug/dL

## 2017-11-23 LAB — CMP (CANCER CENTER ONLY)
ALBUMIN: 4.4 g/dL (ref 3.5–5.0)
ALK PHOS: 76 U/L (ref 38–126)
ALT: 9 U/L (ref 0–44)
ANION GAP: 10 (ref 5–15)
AST: 14 U/L — ABNORMAL LOW (ref 15–41)
BILIRUBIN TOTAL: 0.3 mg/dL (ref 0.3–1.2)
BUN: 58 mg/dL — ABNORMAL HIGH (ref 8–23)
CALCIUM: 9.8 mg/dL (ref 8.9–10.3)
CO2: 27 mmol/L (ref 22–32)
Chloride: 103 mmol/L (ref 98–111)
Creatinine: 2.36 mg/dL — ABNORMAL HIGH (ref 0.44–1.00)
GFR, EST AFRICAN AMERICAN: 23 mL/min — AB (ref >60)
GFR, EST NON AFRICAN AMERICAN: 20 mL/min — AB (ref >60)
Glucose, Bld: 108 mg/dL — ABNORMAL HIGH (ref 70–99)
Potassium: 4.2 mmol/L (ref 3.5–5.1)
Sodium: 140 mmol/L (ref 135–145)
TOTAL PROTEIN: 9.4 g/dL — AB (ref 6.5–8.1)

## 2017-11-23 LAB — VITAMIN B12: Vitamin B-12: 568 pg/mL (ref 180–914)

## 2017-11-23 NOTE — Progress Notes (Signed)
Reason for the request:    Anemia  HPI: I was asked by Dr. [name]  to evaluate Morgan Clay for anemia.  She is a pleasant 72 year old woman currently of [place] where she lives independently.  She has a history of congestive heart failure, hepatitis C as well as sleep apnea.  She has been in her normal state of health with normal hemoglobin until she had recurrent hospitalizations starting March 2019.  At that time her hemoglobin was within normal range with a normal white cell count and platelet count.  Her hemoglobin of ranged between 13 and 14 and 2018 in early 2019.  She was hospitalized in March and April 2019 because of diverticulitis.  She presented in June 2019 with symptomatic anemia with a hemoglobin down to 7 in the setting of anticoagulation on Eliquis and had heme positive stool.  Her last colonoscopy was in March 2011 and underwent a EGD which showed a nonbleeding AVM and was started on Protonix at that time.  She was discharged on October 26, 2017.  At that time hemoglobin was 10.1.  Her hemoglobin declined down to 8.8 on 11/07/2017 and was 9.4 on 2019 after also a brief hospitalization where she was noted to have a melanotic stool.  She underwent a repeat endoscopy on 11/07/2017 which showed a small ulcer in the resumed Eliquis at that time.  Patient referred to me for evaluation for anemia.  With her discharge, she has felt reasonably well although she is developing more fatigue and tiredness.  She reported her stools turned brown although for the last few days they are turning black again.  She denies any hematochezia or hemoptysis.  She denies any chest pain or difficulty breathing.  She does not report any headaches, blurry vision, syncope or seizures. Does not report any fevers, chills or sweats.  Does not report any cough, wheezing or hemoptysis.  Does not report any chest pain, palpitation, orthopnea or leg edema.  Does not report any nausea, vomiting or abdominal pain.  Does not report any  constipation or diarrhea.  Does not report any skeletal complaints.    Does not report frequency, urgency or hematuria.  Does not report any skin rashes or lesions. Does not report any heat or cold intolerance.  Does not report any lymphadenopathy or petechiae.  Does not report any anxiety or depression.  Remaining review of systems is negative.    Past Medical History:  Diagnosis Date  . Arthritis   . Asthma    reports mild asthma since childhood - had COPD on dx list from prior PCP  . Atypical atrial flutter ([place]) 09/28/2015  . Chronic systolic congestive heart failure ([place])   . DEPRESSION 12/17/2009   Annotation: PHQ-9 score = 14 done on 12/17/2009 Qualifier: Diagnosis of  By: Hassell Done FNP, Tori Milks    . FIBROIDS, UTERUS 03/05/2008  . Glaucoma   . HEPATITIS C - s/p treatment with Harvoni, seeing hepatology, Dawn Drazek 01/04/2007  . Hyperlipemia 12/06/2012  . HYPERTENSION, BENIGN 04/24/2007  . Hypothyroidism   . LBBB (left bundle branch block)   . Nonischemic cardiomyopathy ([place])   . OBESITY 05/27/2009  . OBSTRUCTIVE SLEEP APNEA 11/14/2007   npsg 2009:  Mild osa with AHI 11/hr. Started cpap 2009, quit using due to lack of response Home sleep testing 09/2010 with AHI only 7/hr and weight loss advised by pulmonology.  . OSTEOPENIA 09/30/2008  . Personal history of other infectious and parasitic disease    Hepatitis B  :  Past Surgical History:  Procedure Laterality Date  . appendectomy    . CARDIAC CATHETERIZATION    . CARDIOVERSION N/A 12/14/2014   Procedure: CARDIOVERSION;  Surgeon: Dorothy Spark, MD;  Location: [facility] ENDOSCOPY;  Service: Cardiovascular;  Laterality: N/A;  . CARDIOVERSION N/A 02/26/2017   Procedure: CARDIOVERSION;  Surgeon: Evans Lance, MD;  Location: [place] CV LAB;  Service: Cardiovascular;  Laterality: N/A;  . CARDIOVERSION N/A 07/24/2017   Procedure: CARDIOVERSION;  Surgeon: Thayer Headings, MD;  Location: [facility] ENDOSCOPY;  Service: Cardiovascular;  Laterality:  N/A;  . COLONOSCOPY N/A 11/08/2017   Procedure: COLONOSCOPY;  Surgeon: Milus Banister, MD;  Location: [facility] ENDOSCOPY;  Service: Endoscopy;  Laterality: N/A;  . EP IMPLANTABLE DEVICE N/A 10/06/2015   Procedure: BIV ICD Generator Changeout;  Surgeon: [name], MD;  Location: [place] CV LAB;  Service: Cardiovascular;  Laterality: N/A;  . ESOPHAGOGASTRODUODENOSCOPY N/A 10/26/2017   Procedure: ESOPHAGOGASTRODUODENOSCOPY (EGD);  Surgeon: Ladene Artist, MD;  Location: [facility] ENDOSCOPY;  Service: Endoscopy;  Laterality: N/A;  . ESOPHAGOGASTRODUODENOSCOPY (EGD) WITH PROPOFOL N/A 11/07/2017   Procedure: ESOPHAGOGASTRODUODENOSCOPY (EGD) WITH PROPOFOL;  Surgeon: Milus Banister, MD;  Location: [facility] ENDOSCOPY;  Service: Endoscopy;  Laterality: N/A;  . HOT HEMOSTASIS N/A 10/26/2017   Procedure: HOT HEMOSTASIS (ARGON PLASMA COAGULATION/BICAP);  Surgeon: Ladene Artist, MD;  Location: [facility] ENDOSCOPY;  Service: Endoscopy;  Laterality: N/A;  . POLYPECTOMY  11/08/2017   Procedure: POLYPECTOMY;  Surgeon: Milus Banister, MD;  Location: [facility] ENDOSCOPY;  Service: Endoscopy;;  :   Current Outpatient Medications:  .  albuterol (PROVENTIL HFA;VENTOLIN HFA) 108 (90 BASE) MCG/ACT inhaler, Inhale 2 puffs into the lungs every 6 (six) hours as needed for wheezing or shortness of breath., Disp: 1 Inhaler, Rfl: 3 .  amiodarone (PACERONE) 200 MG tablet, Take 200 mg by mouth at bedtime. , Disp: , Rfl:  .  BIDIL 20-37.5 MG tablet, TAKE 1 TABLET BY MOUTH TWICE DAILY, Disp: 180 tablet, Rfl: 3 .  carvedilol (COREG) 12.5 MG tablet, Take 1 tablet (12.5 mg total) by mouth 2 (two) times daily with a meal., Disp: 60 tablet, Rfl: 0 .  ELIQUIS 5 MG TABS tablet, TAKE ONE TABLET BY MOUTH TWICE DAILY, Disp: 180 tablet, Rfl: 3 .  furosemide (LASIX) 40 MG tablet, Take 1 tablet (40 mg total) by mouth 2 (two) times daily., Disp: 60 tablet, Rfl: 0 .  Hydrocortisone (GERHARDT'S BUTT CREAM) CREA, Apply 1 application topically 2 (two) times daily.  (Patient not taking: Reported on 11/06/2017), Disp: 1 each, Rfl: 2 .  levothyroxine (SYNTHROID, LEVOTHROID) 125 MCG tablet, Take 125 mcg by mouth daily before breakfast., Disp: , Rfl:  .  levothyroxine (SYNTHROID, LEVOTHROID) 125 MCG tablet, Take 1 tablet (125 mcg total) by mouth daily., Disp: 90 tablet, Rfl: 1 .  nitroGLYCERIN (NITROSTAT) 0.4 MG SL tablet, Place 0.4 mg under the tongue every 5 (five) minutes as needed for chest pain., Disp: , Rfl:  .  pantoprazole (PROTONIX) 40 MG tablet, Take 1 tablet (40 mg total) by mouth daily., Disp: 30 tablet, Rfl: 0 .  sertraline (ZOLOFT) 50 MG tablet, Take 1 tablet (50 mg total) by mouth daily. (Patient taking differently: Take 50 mg by mouth at bedtime. ), Disp: 30 tablet, Rfl: 3 .  spironolactone (ALDACTONE) 25 MG tablet, Take 0.5 tablets (12.5 mg total) by mouth daily., Disp: 90 tablet, Rfl: 3 .  Tetrahydrozoline HCl (VISINE OP), Place 1 drop into both eyes daily as needed (DRY EYES)., Disp: ,  Rfl:  .  tiZANidine (ZANAFLEX) 2 MG tablet, Take 1 tablet (2 mg total) by mouth at bedtime as needed for muscle spasms., Disp: 20 tablet, Rfl: 0 .  traMADol (ULTRAM) 50 MG tablet, Take 50 mg by mouth at bedtime as needed for severe pain. , Disp: , Rfl: :  No Known Allergies:  Family History  Problem Relation Age of Onset  . Asthma Father   . Heart attack Father   . Asthma Sister   . Cancer Sister        lung  . Heart attack Mother   . Stroke Brother   . Diabetes Unknown   :  Social History   Socioeconomic History  . Marital status: Single    Spouse name: Not on file  . Number of children: Not on file  . Years of education: Not on file  . Highest education level: Not on file  Occupational History  . Not on file  Social Needs  . Financial resource strain: Not on file  . Food insecurity:    Worry: Not on file    Inability: Not on file  . Transportation needs:    Medical: Not on file    Non-medical: Not on file  Tobacco Use  . Smoking status:  Never Smoker  . Smokeless tobacco: Never Used  Substance and Sexual Activity  . Alcohol use: Yes    Comment: 3 glasses of wine 3-4 times per week  . Drug use: No  . Sexual activity: Not on file  Lifestyle  . Physical activity:    Days per week: Not on file    Minutes per session: Not on file  . Stress: Not on file  Relationships  . Social connections:    Talks on phone: Not on file    Gets together: Not on file    Attends religious service: Not on file    Active member of club or organization: Not on file    Attends meetings of clubs or organizations: Not on file    Relationship status: Not on file  . Intimate partner violence:    Fear of current or ex partner: Not on file    Emotionally abused: Not on file    Physically abused: Not on file    Forced sexual activity: Not on file  Other Topics Concern  . Not on file  Social History Narrative   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      Home Situation: lives alone      Spiritual Beliefs: [place]      Lifestyle: no regular exercise; poor diet           :  Pertinent items are noted in HPI.  Exam: ECOG 1 General appearance: alert and cooperative appeared without distress. Head: atraumatic without any abnormalities. Eyes: conjunctivae/corneas clear. PERRL.  Sclera anicteric. Throat: lips, mucosa, and tongue normal; without oral thrush or ulcers. Resp: clear to auscultation bilaterally without rhonchi, wheezes or dullness to percussion. Cardio: regular rate and rhythm, S1, S2 normal, no murmur, click, rub or gallop GI: soft, non-tender; bowel sounds normal; no masses,  no organomegaly Skin: Skin color, texture, turgor normal. No rashes or lesions Lymph nodes: Cervical, supraclavicular, and axillary nodes normal. Neurologic: Grossly normal without any motor, sensory or deep tendon reflexes. Musculoskeletal: No joint deformity or effusion.  CBC    Component Value Date/Time   WBC 6.6 11/08/2017 0658    RBC 3.24 (L) 11/08/2017 0658   HGB 9.4 (  L) 11/08/2017 0658   HGB 8.4 (L) 10/04/2017 1534   HCT 29.1 (L) 11/08/2017 0658   HCT 26.1 (L) 10/04/2017 1534   PLT 250 11/08/2017 0658   PLT 201 10/04/2017 1534   MCV 89.8 11/08/2017 0658   MCV 87 10/04/2017 1534   MCH 29.0 11/08/2017 0658   MCHC 32.3 11/08/2017 0658   RDW 15.4 11/08/2017 0658   RDW 17.3 (H) 10/04/2017 1534   LYMPHSABS 2.1 10/04/2017 1534   MONOABS 0.4 02/26/2017 0532   EOSABS 0.2 10/04/2017 1534   BASOSABS 0.0 10/04/2017 1534       Assessment and Plan:    72 year old woman with the following:  1.  Normocytic, normochromic anemia: This was diagnosed in June 2019 in the setting of acute GI bleeding and stool Hemoccult testing that is positive.  She underwent a endoscopy on 2 separate occasions in June and in July 2019 which showed a bleeding ulcer as well as an AVM.  Last colonoscopy was in 2011.  Her laboratory work-up showed a hemoglobin on 11/08/2017 of 9.4 with normal MCV, white cell count and platelet count.  Iron studies obtained on Oct 04, 2017 showed a iron level of 71 and ferritin of 735.  Her creatinine clearance is around 30 cc/min.  The differential diagnosis was discussed today and her anemia is relatively new and predominantly related to GI blood losses.  Anemia of chronic disease as well as anemia of renal insufficiency is likely contributing as well.  From a management standpoint, I have recommended continuing GI work-up and evaluation for the ongoing GI blood losses.  She continues to have issues with melena which is likely indicates ongoing chronic blood loss.  Erythropoietin replacement the form of Aranesp or Procrit may be indicated if her anemia persists after replacing iron stores.  I will complete her anemia work-up including a serum protein electrophoresis, erythropoietin level as well as iron studies.  Iron replacement may be helpful to combat her ongoing chronic blood losses.  I doubt there is a  hematological disorder or myelodysplastic syndrome.  2.  Melena: I have recommended that she follows up with GI regarding this issue.  3.  Follow-up: We will be in the next 3 to 4 months to follow her status and replace iron as needed.  30  minutes was spent with the patient face-to-face today.  More than 50% of time was dedicated to patient counseling, education and coordination of her care.    Thank you for the referral.   A copy of this consult has been forwarded to the requesting physician.

## 2017-11-23 NOTE — Telephone Encounter (Signed)
Spoke with grandson of the home. Per dr Alen Blew iron is ok,hgb is stable, no transfusion or iron infusion is needed. States he will tell the patient.

## 2017-11-23 NOTE — Telephone Encounter (Signed)
Scheduled appt per 7/19 los - gave patient AVS and calender per los.

## 2017-11-23 NOTE — Telephone Encounter (Signed)
-----   Message from Wyatt Portela, MD sent at 11/23/2017  1:08 PM EDT ----- Please let her know her iron is ok now. Hgb is stable too. No transfusion on iron infusion is needed.

## 2017-11-24 LAB — ERYTHROPOIETIN: Erythropoietin: 5.9 m[IU]/mL (ref 2.6–18.5)

## 2017-11-26 ENCOUNTER — Telehealth: Payer: Self-pay | Admitting: *Deleted

## 2017-11-26 ENCOUNTER — Ambulatory Visit: Payer: Self-pay | Admitting: Family Medicine

## 2017-11-26 LAB — MULTIPLE MYELOMA PANEL, SERUM
ALBUMIN SERPL ELPH-MCNC: 4.2 g/dL (ref 2.9–4.4)
Albumin/Glob SerPl: 1 (ref 0.7–1.7)
Alpha 1: 0.2 g/dL (ref 0.0–0.4)
Alpha2 Glob SerPl Elph-Mcnc: 1 g/dL (ref 0.4–1.0)
B-GLOBULIN SERPL ELPH-MCNC: 1.3 g/dL (ref 0.7–1.3)
GAMMA GLOB SERPL ELPH-MCNC: 2 g/dL — AB (ref 0.4–1.8)
GLOBULIN, TOTAL: 4.5 g/dL — AB (ref 2.2–3.9)
IgA: 557 mg/dL — ABNORMAL HIGH (ref 87–352)
IgG (Immunoglobin G), Serum: 1919 mg/dL — ABNORMAL HIGH (ref 700–1600)
IgM (Immunoglobulin M), Srm: 160 mg/dL (ref 26–217)
TOTAL PROTEIN ELP: 8.7 g/dL — AB (ref 6.0–8.5)

## 2017-11-26 NOTE — Telephone Encounter (Signed)
FYI "Friday Dr. Alen Blew said I need to see my GI. They know nothing about this.  Appointment has been scheduled in September.  My stools are black again so I'm bleeding on the inside again.  I was scoped when in the hospital July 4th.  Told everything is normal because nothing was found but I feel the same way again with no energy and my stomach hurts.  THis happens about every three weeks.  I'll call my PCP.  I can't get an appointment and I'll die by the time I see GI.  What about the bracelet I was told not to remove?  What am I to do about this?   What are my lab results?"  Provided Ms. Harten with lab results and information communicated Friday, November 23, 2017 to her grandson.  No need for transfusion and bracelet should be removed.  Wretha Laris denies use of iron pills, Pepto-Bismal or any medications that darken stools.  Denies any events that correlate with her reported three week intervals of dark stools and stomach pain.  Advised avoidance of irritating, spicy, acidic, caffeinated, foods and beverages.  Instructed to report to ED for progression and/or concerns with abdominnal pain or bleeding symptoms.  Keep GI F/U as scheduled still requesting earlier appointment.  Denies further questions or needs at this time.

## 2017-11-26 NOTE — Telephone Encounter (Signed)
Pt c/o black stools,abdominal pain, feeling tired and weak. Pt noted the black stools last  Sunday.  She describes her abdominal pain as nagging, located across her stomach and radiates to her rectum. Pt stated her abdominal pain as moderate and said that it comes and goes.  Pt stated that she has this type of abdominal pain back in March. Denies vomiting diarrhea, constipation or urine problems.  Reason for Disposition . [1] Abnormal color is unexplained AND [2] persists > 24 hours . [1] MODERATE pain (e.g., interferes with normal activities) AND [2] pain comes and goes (cramps) AND [3] present > 24 hours  (Exception: pain with Vomiting or Diarrhea - see that Guideline)  Answer Assessment - Initial Assessment Questions 1. COLOR: "What color is it?" "Is that color in part or all of the stool?"     Black- all the stools 2. ONSET: "When was the unusual color first noted?"     Last Sunday 3. CAUSE: "Have you eaten any food or taken any medicine of this color?" (See listing in BACKGROUND)     none 4. OTHER SYMPTOMS: "Do you have any other symptoms?" (e.g., diarrhea, jaundice, abdominal pain, fever).     Abdominal pain, tired, weak Nagging cramping across the stomach. Comes and goes. Mild- aggravating her  Answer Assessment - Initial Assessment Questions 1. LOCATION: "Where does it hurt?"      Across the stomach 2. RADIATION: "Does the pain shoot anywhere else?" (e.g., chest, back) Goes to rectum 3. ONSET: "When did the pain begin?" (e.g., minutes, hours or days ago)      Last Sunday 4. SUDDEN: "Gradual or sudden onset?"     suddenly 5. PATTERN "Does the pain come and go, or is it constant?"    - If constant: "Is it getting better, staying the same, or worsening?"      (Note: Constant means the pain never goes away completely; most serious pain is constant and it progresses)     - If intermittent: "How long does it last?" "Do you have pain now?"     (Note: Intermittent means the pain goes  away completely between bouts)     Comes and goes 6. SEVERITY: "How bad is the pain?"  (e.g., Scale 1-10; mild, moderate, or severe)   - MILD (1-3): doesn't interfere with normal activities, abdomen soft and not tender to touch    - MODERATE (4-7): interferes with normal activities or awakens from sleep, tender to touch    - SEVERE (8-10): excruciating pain, doubled over, unable to do any normal activities      moderate 7. RECURRENT SYMPTOM: "Have you ever had this type of abdominal pain before?" If so, ask: "When was the last time?" and "What happened that time?"      Yes in March  8. CAUSE: "What do you think is causing the abdominal pain?"     Pt doesn't know 9. RELIEVING/AGGRAVATING FACTORS: "What makes it better or worse?" (e.g., movement, antacids, bowel movement)     Nothing makes it better or worse 10. OTHER SYMPTOMS: "Has there been any vomiting, diarrhea, constipation, or urine problems?"       no 11. PREGNANCY: "Is there any chance you are pregnant?" "When was your last menstrual period?"       n/a  Protocols used: STOOLS - UNUSUAL COLOR-A-AH, ABDOMINAL PAIN - [facility]

## 2017-11-27 ENCOUNTER — Ambulatory Visit (INDEPENDENT_AMBULATORY_CARE_PROVIDER_SITE_OTHER): Payer: Medicare Other | Admitting: Family Medicine

## 2017-11-27 ENCOUNTER — Encounter: Payer: Self-pay | Admitting: Family Medicine

## 2017-11-27 VITALS — BP 100/38 | HR 60 | Temp 97.8°F | Wt 144.5 lb

## 2017-11-27 DIAGNOSIS — D649 Anemia, unspecified: Secondary | ICD-10-CM

## 2017-11-27 DIAGNOSIS — K921 Melena: Secondary | ICD-10-CM | POA: Diagnosis not present

## 2017-11-27 DIAGNOSIS — R5383 Other fatigue: Secondary | ICD-10-CM

## 2017-11-27 LAB — CBC WITH DIFFERENTIAL/PLATELET
BASOS PCT: 0.8 % (ref 0.0–3.0)
Basophils Absolute: 0.1 10*3/uL (ref 0.0–0.1)
EOS PCT: 2.6 % (ref 0.0–5.0)
Eosinophils Absolute: 0.2 10*3/uL (ref 0.0–0.7)
HEMATOCRIT: 28.4 % — AB (ref 36.0–46.0)
Hemoglobin: 9.3 g/dL — ABNORMAL LOW (ref 12.0–15.0)
Lymphocytes Relative: 25.7 % (ref 12.0–46.0)
Lymphs Abs: 1.9 10*3/uL (ref 0.7–4.0)
MCHC: 32.8 g/dL (ref 30.0–36.0)
MCV: 88.3 fl (ref 78.0–100.0)
MONOS PCT: 7.6 % (ref 3.0–12.0)
Monocytes Absolute: 0.6 10*3/uL (ref 0.1–1.0)
NEUTROS PCT: 63.3 % (ref 43.0–77.0)
Neutro Abs: 4.7 10*3/uL (ref 1.4–7.7)
Platelets: 217 10*3/uL (ref 150.0–400.0)
RBC: 3.21 Mil/uL — AB (ref 3.87–5.11)
RDW: 15.6 % — AB (ref 11.5–15.5)
WBC: 7.4 10*3/uL (ref 4.0–10.5)

## 2017-11-27 LAB — POCT HEMOGLOBIN: Hemoglobin: 8.8 g/dL — AB (ref 12.2–16.2)

## 2017-11-27 NOTE — Patient Instructions (Signed)
Follow up for any increased dizziness or shortness of breath.

## 2017-11-27 NOTE — Progress Notes (Signed)
Subjective:     Patient ID: Morgan Clay, female   DOB: [birth year], 72 y.o.   MRN: [id number]  HPI Patient seen as a work in with chief complaint of "dark stool ". She has chronic problems including history of type 2 diabetes, chronic kidney disease, atrial fibrillation, obstructive sleep apnea, non-ischemic cardiomyopathy, hypothyroidism, hypertension, diverticulosis without history of hemorrhage, COPD  She apparently was doing fairly well until around March of this year. She noted some black stools. In June she had drop in hemoglobin and ended up undergoing EGD which showed small AVM in the stomach. She was then readmitted in early July and had colonoscopy which showed some diverticulosis but no active bleeding. Small ulcer at site of previous AVM on repeat EGD. No active bleeding.  She has continued to experience dark black stools. No epigastric pain. She is on eliquis 5 mgs twice a day for her atrial fibrillation history. She saw hematologist couple days ago was felt to have anemia related to upper GI bleed as well as probably some anemia of chronic disease. Her anemia is normocytic. Had further labs done and iron studies were normal. Ferritin normal. Erythropoietin level normal and B12 normal. Hemoglobin 7/19 was 9.9.  No use of Pepto-Bismol. No dizziness. No syncope. No hemoptysis.  Past Medical History:  Diagnosis Date  . Arthritis   . Asthma    reports mild asthma since childhood - had COPD on dx list from prior PCP  . Atypical atrial flutter ([place]) 09/28/2015  . Chronic systolic congestive heart failure ([place])   . DEPRESSION 12/17/2009   Annotation: PHQ-9 score = 14 done on 12/17/2009 Qualifier: Diagnosis of  By: Hassell Done FNP, Tori Milks    . FIBROIDS, UTERUS 03/05/2008  . Glaucoma   . HEPATITIS C - s/p treatment with Harvoni, seeing hepatology, Dawn Drazek 01/04/2007  . Hyperlipemia 12/06/2012  . HYPERTENSION, BENIGN 04/24/2007  . Hypothyroidism   . LBBB (left bundle branch block)    . Nonischemic cardiomyopathy ([place])   . OBESITY 05/27/2009  . OBSTRUCTIVE SLEEP APNEA 11/14/2007   npsg 2009:  Mild osa with AHI 11/hr. Started cpap 2009, quit using due to lack of response Home sleep testing 09/2010 with AHI only 7/hr and weight loss advised by pulmonology.  . OSTEOPENIA 09/30/2008  . Personal history of other infectious and parasitic disease    Hepatitis B   Past Surgical History:  Procedure Laterality Date  . appendectomy    . CARDIAC CATHETERIZATION    . CARDIOVERSION N/A 12/14/2014   Procedure: CARDIOVERSION;  Surgeon: Dorothy Spark, MD;  Location[facility] ENDOSCOPY;  Service: Cardiovascular;  Laterality: N/A;  . CARDIOVERSION N/A 02/26/2017   Procedure: CARDIOVERSION;  Surgeon: Evans Lance, MD;  Location: [place] CV LAB;  Service: Cardiovascular;  Laterality: N/A;  . CARDIOVERSION N/A 07/24/2017   Procedure: CARDIOVERSION;  Surgeon: Thayer Headings, MD;  Location: [facility] ENDOSCOPY;  Service: Cardiovascular;  Laterality: N/A;  . COLONOSCOPY N/A 11/08/2017   Procedure: COLONOSCOPY;  Surgeon: Milus Banister, MD;  Location: [facility] ENDOSCOPY;  Service: Endoscopy;  Laterality: N/A;  . EP IMPLANTABLE DEVICE N/A 10/06/2015   Procedure: BIV ICD Generator Changeout;  Surgeon: [name], MD;  Location: [place] CV LAB;  Service: Cardiovascular;  Laterality: N/A;  . ESOPHAGOGASTRODUODENOSCOPY N/A 10/26/2017   Procedure: ESOPHAGOGASTRODUODENOSCOPY (EGD);  Surgeon: Ladene Artist, MD;  Location: [facility] ENDOSCOPY;  Service: Endoscopy;  Laterality: N/A;  . ESOPHAGOGASTRODUODENOSCOPY (EGD) WITH PROPOFOL N/A 11/07/2017   Procedure: ESOPHAGOGASTRODUODENOSCOPY (EGD) WITH PROPOFOL;  Surgeon:  Milus Banister, MD;  Location: [facility] ENDOSCOPY;  Service: Endoscopy;  Laterality: N/A;  . HOT HEMOSTASIS N/A 10/26/2017   Procedure: HOT HEMOSTASIS (ARGON PLASMA COAGULATION/BICAP);  Surgeon: Ladene Artist, MD;  Location: [facility] ENDOSCOPY;  Service: Endoscopy;  Laterality: N/A;  . POLYPECTOMY  11/08/2017    Procedure: POLYPECTOMY;  Surgeon: Milus Banister, MD;  Location: [facility] ENDOSCOPY;  Service: Endoscopy;;    reports that she has never smoked. She has never used smokeless tobacco. She reports that she drinks alcohol. She reports that she does not use drugs. family history includes Asthma in her father and sister; Cancer in her sister; Diabetes in her unknown relative; Heart attack in her father and mother; Stroke in her brother. No Known Allergies   Review of Systems  Constitutional: Positive for fatigue. Negative for unexpected weight change.  Respiratory: Negative for cough and shortness of breath.   Cardiovascular: Negative for chest pain.  Gastrointestinal: Negative for abdominal pain, constipation, diarrhea, nausea and vomiting.  Neurological: Negative for dizziness and syncope.       Objective:   Physical Exam  Constitutional: She is oriented to person, place, and time. She appears well-developed and well-nourished.  Cardiovascular: Normal rate and regular rhythm.  Pulmonary/Chest: Effort normal and breath sounds normal.  Abdominal: Soft. Bowel sounds are normal. There is no tenderness.  Musculoskeletal: She exhibits no edema.  Neurological: She is alert and oriented to person, place, and time.       Assessment:     Patient has normocytic anemia and recent AVM per EGD. Colonoscopy was unremarkable. Suspect anemia on the basis of anemia of chronic disease in addition to probably recent AVM bleed.  Hemoglobin 4 days ago through oncology 9.9 and today by fingerstick 8.8.    Plan:     -Repeat CBC -Follow-up immediately for increased dizziness or shortness of breath -Patient already has follow-up scheduled with GI August 7  Eulas Post MD [facility] at [facility]

## 2017-11-29 ENCOUNTER — Ambulatory Visit: Payer: Medicare Other | Admitting: Family Medicine

## 2017-12-06 ENCOUNTER — Ambulatory Visit (INDEPENDENT_AMBULATORY_CARE_PROVIDER_SITE_OTHER): Payer: Medicare Other

## 2017-12-06 DIAGNOSIS — Z95810 Presence of automatic (implantable) cardiac defibrillator: Secondary | ICD-10-CM | POA: Diagnosis not present

## 2017-12-06 DIAGNOSIS — I5022 Chronic systolic (congestive) heart failure: Secondary | ICD-10-CM | POA: Diagnosis not present

## 2017-12-06 NOTE — Progress Notes (Signed)
EPIC Encounter for ICM Monitoring  Patient Name: Morgan Clay is a 72 y.o. female Date: 12/06/2017 Primary Care Physican: [name], DO Primary [place] Electrophysiologist: Caryl Comes Nephrologist: [name] Dry Weight:143lbs (Baseline135-140lbs) Bi-V Pacing: 100%      Heart Failure questions reviewed, pt symptomatic with weight gain up to 145 lbs but has lost 2 lbs.   Thoracic impedance abnormal suggesting fluid accumulation starting 11/30/2017.  Prescribed dosage: Furosemide 40 mg 1tablet(40 mg total)twice a day.   Labs: 10/26/2017 Creatinine2.11, Leodis Sias, Potassium4.6, Sodium141 10/25/2017 Creatinine2.70, BUN77, Potassium4.7, Sodium139  08/30/2017 Creatinine1.96, BUN28, Potassium4.2, Sodium141, EGFR32.46  08/17/2017 Creatinine1.62, BUN34, Potassium4.1, Sodium136  08/16/2017 Creatinine1.69, BUN34, Potassium3.3, [id number]  08/15/2017 Creatinine1.69, BUN38, Potassium3.2, Sodium136  08/14/2017 Creatinine1.80, BUN38, Potassium4.3, Sodium139  08/12/2017 Creatinine2.31, BUN41, Potassium4.7, Sodium135 A complete set of results can be found in Results Review.  Recommendations: Reinforced fluid restriction to < 2 L daily and sodium restriction to less than 2000 mg daily.  Encouraged to call for fluid symptoms.  Follow-up plan: ICM clinic phone appointment on 12/13/2017 (manual send) to recheck fluid levels.  Office appointment scheduled 12/24/2017 with [name], PA.    Copy of ICM check sent to Dr. Caryl Comes.   3 month ICM trend: 12/06/2017    1 Year ICM trend:       [name], RN 12/06/2017 1:53 PM

## 2017-12-10 ENCOUNTER — Telehealth: Payer: Self-pay | Admitting: Internal Medicine

## 2017-12-10 NOTE — Telephone Encounter (Signed)
LVM for return call. 

## 2017-12-10 NOTE — Telephone Encounter (Signed)
Received call back from patient.  Explained [facility] nurse notified Dr Olin Pia nurse that she has a weight gain.  Advised I attempted to reach her on 8/1 due to ICM remote transmission suggest fluid accumulation.  She reported she feels tired, increase shortness of breath and weight gain of ~5 lbs.  Advised to send remote transmission this evening or tomorrow morning.  Advised will request Dr Olin Pia nurse consult with DOD tomorrow since Dr Caryl Comes is out of the office.  She stated that would be fine.  Advised she should go to the emergency if her symptoms become urgent.

## 2017-12-10 NOTE — Telephone Encounter (Signed)
Hi Morgan Clay,   She showed fluid accumulation on 8/1 report and I was not able to reach her.  I will try calling her tomorrow morning to follow up.

## 2017-12-10 NOTE — Telephone Encounter (Signed)
New message   Pt c/o Shortness Of Breath: STAT if SOB developed within the last 24 hours or pt is noticeably SOB on the phone  1. Are you currently SOB (can you hear that pt is SOB on the phone)? Yes  2. How long have you been experiencing SOB? Within the last 24 hours   3. Are you SOB when sitting or when up moving around? Sob when sitting   4. Are you currently experiencing any other symptoms? Per lauren, has gained 3 lbs within the last 5 days.  Per Ander Purpura, you call the patient.

## 2017-12-11 ENCOUNTER — Ambulatory Visit (INDEPENDENT_AMBULATORY_CARE_PROVIDER_SITE_OTHER): Payer: Medicare Other

## 2017-12-11 DIAGNOSIS — I5022 Chronic systolic (congestive) heart failure: Secondary | ICD-10-CM

## 2017-12-11 DIAGNOSIS — Z95810 Presence of automatic (implantable) cardiac defibrillator: Secondary | ICD-10-CM

## 2017-12-11 NOTE — Progress Notes (Signed)
EPIC Encounter for ICM Monitoring  Patient Name: Morgan Clay is a 72 y.o. female Date: 12/11/2017 Primary Care Physican: [name], DO Primary [place] Electrophysiologist: Caryl Comes Nephrologist: [name] Dry Weight:147lbs (Baseline~140lbs) Bi-V Pacing: 100%        Late Entry for 12/10/2017: Heart Failure questions reviewed. Spoke with patient on 12/10/2017.  She is symptomatic with weight gain~5 lbs in the last week, shortness of breath and feeling tired.  She reported not feeling well.     Thoracic impedance abnormal suggesting fluid accumulation starting 11/30/2017.  Prescribed dosage: Furosemide 40 mg 1tablet(40 mg total)twice a day.   Labs: 11/23/2017 Creatinine 2.36, BUN 58, Potassium 4.2, Sodium 140, EGFR 20-23 11/08/2017 Creatinine 2.13, BUN 44, Potassium 3.6, Sodium 142, EGFR 23-26  11/07/2017 Creatinine 2.10, BUN 52, Potassium 4.0, Sodium 141, EGFR 23-27  11/06/2017 Creatinine 2.07, BUN 51, Potassium 5.3, Sodium 137, EGFR 23-27  10/26/2017 Creatinine2.11, BUN52, Potassium4.6, Sodium141 10/25/2017 Creatinine2.70, BUN77, Potassium4.7, Sodium139  08/30/2017 Creatinine1.96, BUN28, Potassium4.2, Sodium141, EGFR32.46  08/17/2017 Creatinine1.62, BUN34, Potassium4.1, Sodium136  08/16/2017 Creatinine1.69, BUN34, Potassium3.3, [id number]  08/15/2017 Creatinine1.69, BUN38, Potassium3.2, Sodium136  08/14/2017 Creatinine1.80, BUN38, Potassium4.3, Sodium139  08/12/2017 Creatinine2.31, BUN41, Potassium4.7, Sodium135 A complete set of results can be found in Results Review.  Recommendations: When spoke with patient on 12/10/2017 advised her to use ER if symptoms worsen.  Dr Olin Pia nurse will review with DOD on 8/6 for recommendations and will call her back.    Follow-up plan: ICM clinic phone appointment on 12/18/2017 to recheck fluid levels.   Office appointment scheduled 12/24/2017 with Dillon Bjork, PA.    Copy of ICM check  sent to Dr. Caryl Comes.   3 month ICM trend: 12/10/2017    1 Year ICM trend:       [name], RN 12/11/2017 7:28 AM

## 2017-12-11 NOTE — Progress Notes (Signed)
Dr Olin Pia nurse reviewed with DOD, Dr Curt Bears and he recommended she take Furosemide 80 mg bid x 2 days. See telephone note 8/5 for instructions given by Dr Olin Pia nurse.  Will recheck transmission on 8/9 instead of 8/13.

## 2017-12-11 NOTE — Telephone Encounter (Signed)
Spoke with [name]. Gave her instructions from DOD, Dr Curt Bears, she is to increase her lasix to 80mg  bid for 2 days. She will call the office on Thursday to give [name] an update on how she is feeling. Pt has an appt with [name] on 8/19 to follow up and for device check. Pt agrees to plan and will call with any additional questions.

## 2017-12-12 ENCOUNTER — Encounter: Payer: Self-pay | Admitting: Gastroenterology

## 2017-12-12 ENCOUNTER — Other Ambulatory Visit (INDEPENDENT_AMBULATORY_CARE_PROVIDER_SITE_OTHER): Payer: Medicare Other

## 2017-12-12 ENCOUNTER — Ambulatory Visit (INDEPENDENT_AMBULATORY_CARE_PROVIDER_SITE_OTHER): Payer: Medicare Other | Admitting: Gastroenterology

## 2017-12-12 VITALS — BP 166/52 | HR 64 | Ht 59.0 in | Wt 148.0 lb

## 2017-12-12 DIAGNOSIS — D649 Anemia, unspecified: Secondary | ICD-10-CM

## 2017-12-12 DIAGNOSIS — R195 Other fecal abnormalities: Secondary | ICD-10-CM | POA: Diagnosis not present

## 2017-12-12 LAB — BASIC METABOLIC PANEL
BUN: 52 mg/dL — ABNORMAL HIGH (ref 6–23)
CHLORIDE: 105 meq/L (ref 96–112)
CO2: 27 meq/L (ref 19–32)
Calcium: 9.6 mg/dL (ref 8.4–10.5)
Creatinine, Ser: 2.84 mg/dL — ABNORMAL HIGH (ref 0.40–1.20)
GFR: 21.14 mL/min — ABNORMAL LOW (ref >60.00)
GLUCOSE: 107 mg/dL — AB (ref 70–99)
POTASSIUM: 3.8 meq/L (ref 3.5–5.1)
SODIUM: 139 meq/L (ref 135–145)

## 2017-12-12 LAB — CBC
MCHC: 33.3 g/dL (ref 30.0–36.0)
MCV: 89.1 fl (ref 78.0–100.0)
PLATELETS: 181 10*3/uL (ref 150.0–400.0)
RBC: 2.86 Mil/uL — AB (ref 3.87–5.11)
RDW: 15.9 % — ABNORMAL HIGH (ref 11.5–15.5)
WBC: 6.7 10*3/uL (ref 4.0–10.5)

## 2017-12-12 LAB — FERRITIN: Ferritin: 59.6 ng/mL (ref 10.0–291.0)

## 2017-12-12 LAB — IBC PANEL
Iron: 54 ug/dL (ref 42–145)
SATURATION RATIOS: 11.2 % — AB (ref 20.0–50.0)
Transferrin: 345 mg/dL (ref 212.0–360.0)

## 2017-12-12 NOTE — Progress Notes (Addendum)
12/12/2017 Morgan Clay [id number] 12-08-1945   HISTORY OF PRESENT ILLNESS:  This is a 72 year old African-American female who is a patient of Dr. Celesta Aver with complaints of weakness and fatigue along with black stools x 3 weeks on Eliquis.  Patient had hospital admission 6/20 through 10/27/2017 also with melena had undergone EGD with finding of a single 4 mm AVM along the greater curve of the stomach which was treated with APC.  Then again 7/3 to 7/4 and had EGD that showed small clean based ulcer at the site of APC.  Colonoscopy with diverticulosis and one polyp.    Says that stools have been black consistently with every BM for 2-3 weeks.  Not on any iron, pepto bismol, etc.  Complaining of weakness and fatigue.  Last Hgb was 9.3 grams on 7/23.   Past Medical History:  Diagnosis Date  . Anemia   . Arthritis   . Asthma    reports mild asthma since childhood - had COPD on dx list from prior PCP  . Atypical atrial flutter ([place]) 09/28/2015  . Chronic systolic congestive heart failure ([place])   . DEPRESSION 12/17/2009   Annotation: PHQ-9 score = 14 done on 12/17/2009 Qualifier: Diagnosis of  By: Hassell Done FNP, Tori Milks    . Diverticulosis   . FIBROIDS, UTERUS 03/05/2008  . GI bleed   . Glaucoma   . HEPATITIS C - s/p treatment with Harvoni, seeing hepatology, Dawn Drazek 01/04/2007  . Hyperlipemia 12/06/2012  . HYPERTENSION, BENIGN 04/24/2007  . Hypothyroidism   . LBBB (left bundle branch block)   . Nonischemic cardiomyopathy ([place])   . OBESITY 05/27/2009  . OBSTRUCTIVE SLEEP APNEA 11/14/2007   npsg 2009:  Mild osa with AHI 11/hr. Started cpap 2009, quit using due to lack of response Home sleep testing 09/2010 with AHI only 7/hr and weight loss advised by pulmonology.  . OSTEOPENIA 09/30/2008  . Personal history of other infectious and parasitic disease    Hepatitis B   Past Surgical History:  Procedure Laterality Date  . appendectomy    . CARDIAC CATHETERIZATION    . CARDIOVERSION  N/A 12/14/2014   Procedure: CARDIOVERSION;  Surgeon: Dorothy Spark, MD;  Location: [facility] ENDOSCOPY;  Service: Cardiovascular;  Laterality: N/A;  . CARDIOVERSION N/A 02/26/2017   Procedure: CARDIOVERSION;  Surgeon: Evans Lance, MD;  Location: [facility] CV LAB;  Service: Cardiovascular;  Laterality: N/A;  . CARDIOVERSION N/A 07/24/2017   Procedure: CARDIOVERSION;  Surgeon: Thayer Headings, MD;  Location: [facility] ENDOSCOPY;  Service: Cardiovascular;  Laterality: N/A;  . COLONOSCOPY N/A 11/08/2017   Procedure: COLONOSCOPY;  Surgeon: Milus Banister, MD;  Location: [facility] ENDOSCOPY;  Service: Endoscopy;  Laterality: N/A;  . EP IMPLANTABLE DEVICE N/A 10/06/2015   Procedure: BIV ICD Generator Changeout;  Surgeon: [name], MD;  Location: [place] CV LAB;  Service: Cardiovascular;  Laterality: N/A;  . ESOPHAGOGASTRODUODENOSCOPY N/A 10/26/2017   Procedure: ESOPHAGOGASTRODUODENOSCOPY (EGD);  Surgeon: Ladene Artist, MD;  Location: [facility] ENDOSCOPY;  Service: Endoscopy;  Laterality: N/A;  . ESOPHAGOGASTRODUODENOSCOPY (EGD) WITH PROPOFOL N/A 11/07/2017   Procedure: ESOPHAGOGASTRODUODENOSCOPY (EGD) WITH PROPOFOL;  Surgeon: Milus Banister, MD;  Location: [facility] ENDOSCOPY;  Service: Endoscopy;  Laterality: N/A;  . HOT HEMOSTASIS N/A 10/26/2017   Procedure: HOT HEMOSTASIS (ARGON PLASMA COAGULATION/BICAP);  Surgeon: Ladene Artist, MD;  Location: [facility] ENDOSCOPY;  Service: Endoscopy;  Laterality: N/A;  . POLYPECTOMY  11/08/2017   Procedure: POLYPECTOMY;  Surgeon: Milus Banister, MD;  Location: [facility]  ENDOSCOPY;  Service: Endoscopy;;    reports that she has never smoked. She has never used smokeless tobacco. She reports that she drinks alcohol. She reports that she does not use drugs. family history includes Asthma in her father and sister; Heart attack in her father and mother; Lung cancer in her sister; Stroke in her brother. No Known Allergies    Outpatient Encounter Medications as of 12/12/2017  Medication Sig  .  albuterol (PROVENTIL HFA;VENTOLIN HFA) 108 (90 BASE) MCG/ACT inhaler Inhale 2 puffs into the lungs every 6 (six) hours as needed for wheezing or shortness of breath.  [name] amiodarone (PACERONE) 200 MG tablet Take 200 mg by mouth at bedtime.   [name] BIDIL 20-37.5 MG tablet TAKE 1 TABLET BY MOUTH TWICE DAILY  . carvedilol (COREG) 12.5 MG tablet Take 1 tablet (12.5 mg total) by mouth 2 (two) times daily with a meal.  . ELIQUIS 5 MG TABS tablet TAKE ONE TABLET BY MOUTH TWICE DAILY  . furosemide (LASIX) 40 MG tablet Take 1 tablet (40 mg total) by mouth 2 (two) times daily.  . Hydrocortisone (GERHARDT'S BUTT CREAM) CREA Apply 1 application topically 2 (two) times daily.  [name] levothyroxine (SYNTHROID, LEVOTHROID) 125 MCG tablet Take 125 mcg by mouth daily before breakfast.  . nitroGLYCERIN (NITROSTAT) 0.4 MG SL tablet Place 0.4 mg under the tongue every 5 (five) minutes as needed for chest pain.  . pantoprazole (PROTONIX) 40 MG tablet Take 1 tablet (40 mg total) by mouth daily.  . sertraline (ZOLOFT) 50 MG tablet Take 1 tablet (50 mg total) by mouth daily. (Patient taking differently: Take 50 mg by mouth at bedtime. )  . spironolactone (ALDACTONE) 25 MG tablet Take 0.5 tablets (12.5 mg total) by mouth daily.  . Tetrahydrozoline HCl (VISINE OP) Place 1 drop into both eyes daily as needed (DRY EYES).  [name] tiZANidine (ZANAFLEX) 2 MG tablet Take 1 tablet (2 mg total) by mouth at bedtime as needed for muscle spasms.  . traMADol (ULTRAM) 50 MG tablet Take 50 mg by mouth at bedtime as needed for severe pain.    No facility-administered encounter medications on file as of 12/12/2017.      REVIEW OF SYSTEMS  : All other systems reviewed and negative except where noted in the History of Present Illness.   PHYSICAL EXAM: BP (!) 166/52   Pulse 64   Ht 4\' 11"  (1.499 m)   Wt 148 lb (67.1 kg)   BMI 29.89 kg/m  General: Well developed black female in no acute distress Head: Normocephalic and atraumatic Eyes:  Sclerae  anicteric, conjunctiva pink. Ears: Normal auditory acuity Lungs: Clear throughout to auscultation; no increased WOB. Heart: Regular rate and rhythm; no M/R/G.   Abdomen: Soft, non-distended.  BS present.  Non-tender.   Rectal:  Light brown stool on exam glove but was strongly hemoccult positive. Musculoskeletal: Symmetrical with no gross deformities  Skin: No lesions on visible extremities Extremities: No edema  Neurological: Alert oriented x 4, grossly non-focal Psychological:  Alert and cooperative. Normal mood and affect  ASSESSMENT AND PLAN: #69 72 year old African-American female with complaints of weakness and fatigue along with black stools x 3 weeks on Eliquis.  Has light brown stool on exam but is strongly heme positive. Patient had hospital admission 6/20 through 10/27/2017 also with melena had undergone EGD with finding of a single 4 mm AVM along the greater curve of the stomach which was treated with APC.  Then again 7/3 to 7/4 and had EGD that  showed small clean based ulcer at the site of APC.  Colonoscopy with diverticulosis and one polyp. #2 normocytic anemia secondary to above #3 atrial fibrillation, on Eliquis #4 coronary artery disease-nonischemic cardiomyopathy #5 congestive heart failure #6 status post biventricular ICD #7 COPD  -Will check CBC, BMP, and iron studies today. -Discussed with [name].  Will proceed with capsule endoscopy on Eliquis to evaluate for small bowel AVM's.  CC:  [name], DO  Agree with Ms. Alphia Kava management. She wqas admitted and transfused again - waiting on capsule results [name], MD, [facility]

## 2017-12-12 NOTE — Patient Instructions (Addendum)
You have been scheduled for a capsule endoscopy on 12-24-17 at 8 am. Please follow the written instructions provided for you today.   Your provider has requested that you go to the basement level for lab work before leaving today. Press "B" on the elevator. The lab is located at the first door on the left as you exit the elevator.

## 2017-12-14 ENCOUNTER — Other Ambulatory Visit: Payer: Self-pay

## 2017-12-14 ENCOUNTER — Ambulatory Visit (INDEPENDENT_AMBULATORY_CARE_PROVIDER_SITE_OTHER): Payer: Self-pay

## 2017-12-14 DIAGNOSIS — D649 Anemia, unspecified: Secondary | ICD-10-CM

## 2017-12-14 DIAGNOSIS — Z95810 Presence of automatic (implantable) cardiac defibrillator: Secondary | ICD-10-CM

## 2017-12-14 DIAGNOSIS — I5022 Chronic systolic (congestive) heart failure: Secondary | ICD-10-CM

## 2017-12-14 NOTE — Progress Notes (Signed)
EPIC Encounter for ICM Monitoring  Patient Name: Morgan Clay is a 72 y.o. female Date: 12/14/2017 Primary Care Physican: [name], DO Primary [place] Electrophysiologist: Caryl Comes Nephrologist: [name] Dry Weight:146lbs (Baseline~140lbs) Bi-V Pacing: 100%       Heart Failure questions reviewed, pt symptoms remain unchanged after doubling Furosemide x 2 days.  She has weight gain of ~4 lbs in the last week, shortness of breath and feeling tired.  She said she is not feeling well at all.    Thoracic impedance remains normal Furosemide 80 mg bid x 2 days.  Impedance suggesting fluid accumulation since 11/30/2017.  Prescribed dosage: Furosemide 40 mg 1tablet(40 mg total)twice a day.   Labs: 12/12/2017 Creatinine 2.84, BUN 52, Potassium 3.8, Sodium 139 11/23/2017 Creatinine 2.36, BUN 58, Potassium 4.2, Sodium 140, EGFR 20-23 11/08/2017 Creatinine 2.13, BUN 44, Potassium 3.6, Sodium 142, EGFR 23-26  11/07/2017 Creatinine 2.10, BUN 52, Potassium 4.0, Sodium 141, EGFR 23-27  11/06/2017 Creatinine 2.07, BUN 51, Potassium 5.3, Sodium 137, EGFR 23-27  10/26/2017 Creatinine2.11, BUN52, Potassium4.6, Sodium141 10/25/2017 Creatinine2.70, BUN77, Potassium4.7, Sodium139  08/30/2017 Creatinine1.96, BUN28, Potassium4.2, Sodium141, EGFR32.46  08/17/2017 Creatinine1.62, BUN34, Potassium4.1, Sodium136  08/16/2017 Creatinine1.69, BUN34, Potassium3.3, [id number]  08/15/2017 Creatinine1.69, BUN38, Potassium3.2, Sodium136  08/14/2017 Creatinine1.80, BUN38, Potassium4.3, Sodium139  08/12/2017 Creatinine2.31, BUN41, Potassium4.7, Sodium135 A complete set of results can be found in Results Review.  Recommendations:  Advised will contact scheduling to check if there is an appointment available in the next week with PA/NP at [facility] street office.   Follow-up plan: ICM clinic phone appointment on 12/18/2017 (manual send) to recheck fluid  levels.  Office appointment scheduled 12/24/2017 with Dillon Bjork, PA.  Copy of ICM check sent to Dr. Caryl Comes.   3 month ICM trend: 12/14/2017    1 Year ICM trend:       [name], RN 12/14/2017 8:25 AM

## 2017-12-14 NOTE — Progress Notes (Signed)
Reviewed with [name], PA due to Dr Caryl Comes is out of the office.  Advised to schedule patient with PA/NP at next available appointment.  Advised that she should seek ER treatment if her condition worsens.

## 2017-12-14 NOTE — Progress Notes (Signed)
Call to patient.  Advised a message was sent to scheduling office to make an appointment as soon as possible with NP/PA. Advised to go to ER if symptoms worsen.  She verbalized understanding.

## 2017-12-17 ENCOUNTER — Telehealth: Payer: Self-pay

## 2017-12-17 NOTE — Telephone Encounter (Signed)
Spoke with patient.  Advised there was a cancellation today with Kerin Ransom, PA at the [facility] office if she is still experiencing fluid symptoms. She said she is not as tired today and does not need an appointment.  Advised there are no further open appointments this week at this time to see a PA/NP.  She has scheduled appointment on 8/19 with [name], PA and will wait and talk with her.  Advised to send remote transmission tomorrow morning for review of fluid levels and will discuss report with Dr Caryl Comes in the office tomorrow.  She said she will be having an iron infusion for the anemia this week and thinks this will help her symptoms.

## 2017-12-18 ENCOUNTER — Telehealth: Payer: Self-pay | Admitting: Cardiology

## 2017-12-18 NOTE — Telephone Encounter (Signed)
Spoke with pt and reminded pt of remote transmission that is due today. Pt verbalized understanding.   

## 2017-12-19 ENCOUNTER — Ambulatory Visit (HOSPITAL_COMMUNITY)
Admission: RE | Admit: 2017-12-19 | Discharge: 2017-12-19 | Disposition: A | Discharge status: Home / Self Care | Payer: Medicare Other | Source: Ambulatory Visit | Attending: Gastroenterology | Admitting: Gastroenterology

## 2017-12-19 ENCOUNTER — Telehealth: Payer: Self-pay | Admitting: *Deleted

## 2017-12-19 ENCOUNTER — Telehealth: Payer: Self-pay | Admitting: Gastroenterology

## 2017-12-19 DIAGNOSIS — D649 Anemia, unspecified: Secondary | ICD-10-CM | POA: Diagnosis not present

## 2017-12-19 MED ORDER — SODIUM CHLORIDE 0.9 % IV SOLN
510.0000 mg | Freq: Once | INTRAVENOUS | Status: AC
  Administered 2017-12-19: 510 mg via INTRAVENOUS
  Filled 2017-12-19: qty 17

## 2017-12-19 MED ORDER — SODIUM CHLORIDE 0.9 % IV SOLN
INTRAVENOUS | Status: DC
  Administered 2017-12-19 (×3): via INTRAVENOUS

## 2017-12-19 NOTE — Discharge Instructions (Signed)

## 2017-12-19 NOTE — Telephone Encounter (Signed)
Received call from Radiology, I [place].  He said that the iron infusions that Alonza Bogus PA ordered is completed. The patient's blood pressure prior to the infusion this am was 169/49, . He just took the BP again is 173/61 at 12:25 PM.  I advised Alonza Bogus PA, she said the patient can go home at this time.

## 2017-12-19 NOTE — Progress Notes (Addendum)
Patient received Feraheme via PIV.  Observed for 30 minutes post infusion. Tolerated well. However, post infusion BP rose to 173/51. Called her provider [place], Janett Billow to inform her. Per her staff,  [place], P, I can discharge her with that blood pressure. Discharge instructions given to patient, verbalized understanding. Patient alert, oriented and ambulatory at the time of discharge.   Patient said she did not take her BP this morning before coming. Said she will when she gets home.

## 2017-12-20 ENCOUNTER — Telehealth: Payer: Self-pay | Admitting: Internal Medicine

## 2017-12-20 ENCOUNTER — Other Ambulatory Visit: Payer: Self-pay | Admitting: Family Medicine

## 2017-12-20 DIAGNOSIS — N189 Chronic kidney disease, unspecified: Secondary | ICD-10-CM | POA: Diagnosis not present

## 2017-12-20 DIAGNOSIS — I5043 Acute on chronic combined systolic (congestive) and diastolic (congestive) heart failure: Secondary | ICD-10-CM

## 2017-12-20 DIAGNOSIS — D631 Anemia in chronic kidney disease: Secondary | ICD-10-CM | POA: Diagnosis not present

## 2017-12-20 DIAGNOSIS — N183 Chronic kidney disease, stage 3 (moderate): Secondary | ICD-10-CM | POA: Diagnosis not present

## 2017-12-20 DIAGNOSIS — I129 Hypertensive chronic kidney disease with stage 1 through stage 4 chronic kidney disease, or unspecified chronic kidney disease: Secondary | ICD-10-CM | POA: Diagnosis not present

## 2017-12-20 DIAGNOSIS — R809 Proteinuria, unspecified: Secondary | ICD-10-CM | POA: Diagnosis not present

## 2017-12-20 MED ORDER — METOLAZONE 2.5 MG PO TABS: 2.5000 mg | ORAL_TABLET | Freq: Every day | ORAL | 0 refills | Status: DC

## 2017-12-20 NOTE — Telephone Encounter (Signed)
Spoke with Morgan Clay. She is in agreement to Dr Olin Pia plan. Morgan Clay is scheduled to see [name] on Monday 8/19. At that time she will have labs drawn and her weight assessed for a possible admission for IV Lasix per Dr Caryl Comes.   Morgan Clay understands she will need to be diligent in her symptom management. Morgan Clay understands with her anemia and renal failure she has complicated issues. She is to seek medical attention if she becomes light headed, dizzy, symptomatic with low BP's.   Morgan Clay has agreed to and verbalized understanding to this plan. Sharman Cheek has been made aware as well.

## 2017-12-20 NOTE — Progress Notes (Signed)
See 12/17/2017 phone note for further follow up.

## 2017-12-20 NOTE — Telephone Encounter (Signed)
New message  Pt c/o Shortness Of Breath: STAT if SOB developed within the last 24 hours or pt is noticeably SOB on the phone  1. Are you currently SOB (can you hear that pt is SOB on the phone)? Yes, I can hear over the phone  2. How long have you been experiencing SOB? 3 weeks   3. Are you SOB when sitting or when up moving around? Sitting and moving around  4. Are you currently experiencing any other symptoms? Tired, headache

## 2017-12-20 NOTE — Telephone Encounter (Signed)
Patient put through to triage for audible SOB. The phone connection was very poor and the patient was difficult to understand and hear.  The patient states she has had worsening SOB and weight gain of 5 pounds over the last 2 weeks. She is tired and has no energy.  She reports no obvious swelling but her clothes feel tight.  She has not tried her inhaler, but "it feels different, like drowning." She states her kidney doctor told her to call Cardiology for further recommendations.  The patient states she does not need to go to the ED at this time, but will instead wait for recommendations from Dr. Caryl Comes.   To Dr. Caryl Comes, his nurse, and the [facility] nurse.

## 2017-12-20 NOTE — Telephone Encounter (Signed)
Per Dr Caryl Comes, pt is to increase her lasix to 120mg  bid for the next 3 days and to take metolazone, 2.5mg  once per day for the next two days.

## 2017-12-20 NOTE — Telephone Encounter (Signed)
Recommend she contact her GI office to see if they want her to continue and what dose and for refills if they want her to continue. Thanks.

## 2017-12-20 NOTE — Telephone Encounter (Signed)
I called the pt and informed her of the message below.  She agreed to call the GI's office.

## 2017-12-23 NOTE — Progress Notes (Signed)
Cardiology Office Note Date:  12/24/2017  Patient ID:  Morgan, Clay Mar 11, 1946, MRN [id number] PCP:  [name], DO  Cardiologist:  Dr. Caryl Comes    Chief Complaint: SOB  History of Present Illness: Morgan Clay is a 72 y.o. female with history of NICM w/CRT-D, PAFlutter (atypical), hypothyroidism, CKD (IV), Hep C (treated with Harvoni, Dr. Beverley Fiedler), HTN, LBBB, obesity, DM (diet managed)  She comes in today to be seen for Dr. Caryl Comes.  In March 2019, she was felt to be symptomatic with her Flutter, had been loaded on amiodarone and planned for DCCV, done on 07/24/17 and successful.  She was hospitalized at [facility] with SBO, acute/chronic renal failure felt due to hemodynamics in the setting of poor PO intake and medications.  Renal US revealed medical renal disease.  Her entresto and diuretics were discontinued.  She was given IVF and made some improvement.  Unfortunately, she developed volume overload with worsening SOB and edema.  IV diuresis was then started, TTE noted LVEF improved at 60-65%, no WMA, noted grade II DD, mod MR/TR, RV 42mm.  She was last seen by card servicei 08/13/17 improving, and final diuretic recs left to nephrology, she was maintaining SR on amiodarone, discharged 08/17/17.  Readmitted with symptomatic anemia, melena with Hgb 7.1 >> 2u PRBC Hbg 10.2, EGD by Dr. Fuller Plan today showed 1 benign appearing intrinsic mild stenosis at the gastroesophageal junction measuring 1.6 cm in diameter. Esophagus otherwise normal. 4 mm single nonbleeding angiodysplastic lesion in the greater curvature of the stomach status post argon plasma coagulation. He was instructed not to take aspirin, Motrin, ibuprofen, naproxen or other nonsteroidal anti-inflammatory drugs. Eliquis cleared to be resumed 10/30/2017, discharged 10/26/17. (no EKG from this admission)  Readmitted 11/06/17 with fatigue, symptomatic anemia, Hgb initially 9.3 > 8.8 w/reports of melena, back on Eliquis.  11/07/2017 small  ulcer clean based otherwise normal endoscopic exam, Colonoscopy was negative for any active bleeding, Gastroenterology cleared for d/c.  Also recommended OK to resume her eliquis and discharged 11/08/17.  EKG from this admission was SR/V paced.  12/14/17 OptiVol noted trending upwards despite recent extra lasix, declined available PA visit to be seen  She called 12/20/17 with c/o SOB "like drowning" mentioned clothes felt tight, though no noted swelling, 5lb weight gain, declined to go to the ER, via telephone the patient recommended by Dr. Caryl Comes to increase her lasix to 120mg  BID for 3 days and take metolazone 2.5mg  daily for 2 days to be seen today in f/u of this.  She comes today feeling much better.  She c/w generalize fatigue, no energy, but had brisk urine OP with the above diuretics for a few days, and says her weight, clothes, breathing is back to h er baseline.  No CP, palpitations, no dizziness, near syncope or syncope.  She tells me GI is monitoring her blood counts, had iron infusion last week, and took the capsule endoscopy today, she continues with waxing/waning melena.  She spoke with Dr, Waynette Buttery last week, is scheduled to see her in 2 months.  She reports discussing the additional diuretics and tells me [name] was OK with that.  She has not been instructed to stop her Eliquis by any of her MD's    Device information: MDT CRT-D, implanted 2008, Gen change 2012, 2017 Hx of diaphragmatic stim  Atypical AFlutter/ATach DCCV 07/2017 AAD tx: amiodarone started Feb 2019  11/19/17: TSH 2.09 11/23/17: AST 14, ALT 9  Past Medical History:  Diagnosis Date  .  Anemia   . Arthritis   . Asthma    reports mild asthma since childhood - had COPD on dx list from prior PCP  . Atypical atrial flutter ([place]) 09/28/2015  . Chronic systolic congestive heart failure ([place])   . DEPRESSION 12/17/2009   Annotation: PHQ-9 score = 14 done on 12/17/2009 Qualifier: Diagnosis of  By: Hassell Done FNP, Tori Milks    .  Diverticulosis   . FIBROIDS, UTERUS 03/05/2008  . GI bleed   . Glaucoma   . HEPATITIS C - s/p treatment with Harvoni, seeing hepatology, Dawn Drazek 01/04/2007  . Hyperlipemia 12/06/2012  . HYPERTENSION, BENIGN 04/24/2007  . Hypothyroidism   . LBBB (left bundle branch block)   . Nonischemic cardiomyopathy ([place])   . OBESITY 05/27/2009  . OBSTRUCTIVE SLEEP APNEA 11/14/2007   npsg 2009:  Mild osa with AHI 11/hr. Started cpap 2009, quit using due to lack of response Home sleep testing 09/2010 with AHI only 7/hr and weight loss advised by pulmonology.  . OSTEOPENIA 09/30/2008  . Personal history of other infectious and parasitic disease    Hepatitis B    Past Surgical History:  Procedure Laterality Date  . appendectomy    . CARDIAC CATHETERIZATION    . CARDIOVERSION N/A 12/14/2014   Procedure: CARDIOVERSION;  Surgeon: Dorothy Spark, MD;  Location: [facility] ENDOSCOPY;  Service: Cardiovascular;  Laterality: N/A;  . CARDIOVERSION N/A 02/26/2017   Procedure: CARDIOVERSION;  Surgeon: Evans Lance, MD;  Location: [place] CV LAB;  Service: Cardiovascular;  Laterality: N/A;  . CARDIOVERSION N/A 07/24/2017   Procedure: CARDIOVERSION;  Surgeon: Thayer Headings, MD;  Location: [facility] ENDOSCOPY;  Service: Cardiovascular;  Laterality: N/A;  . COLONOSCOPY N/A 11/08/2017   Procedure: COLONOSCOPY;  Surgeon: Milus Banister, MD;  Location: [facility] ENDOSCOPY;  Service: Endoscopy;  Laterality: N/A;  . EP IMPLANTABLE DEVICE N/A 10/06/2015   Procedure: BIV ICD Generator Changeout;  Surgeon: [name], MD;  Location: [place] CV LAB;  Service: Cardiovascular;  Laterality: N/A;  . ESOPHAGOGASTRODUODENOSCOPY N/A 10/26/2017   Procedure: ESOPHAGOGASTRODUODENOSCOPY (EGD);  Surgeon: Ladene Artist, MD;  Location: [facility] ENDOSCOPY;  Service: Endoscopy;  Laterality: N/A;  . ESOPHAGOGASTRODUODENOSCOPY (EGD) WITH PROPOFOL N/A 11/07/2017   Procedure: ESOPHAGOGASTRODUODENOSCOPY (EGD) WITH PROPOFOL;  Surgeon: Milus Banister, MD;   Location: [facility] ENDOSCOPY;  Service: Endoscopy;  Laterality: N/A;  . HOT HEMOSTASIS N/A 10/26/2017   Procedure: HOT HEMOSTASIS (ARGON PLASMA COAGULATION/BICAP);  Surgeon: Ladene Artist, MD;  Location: [facility] ENDOSCOPY;  Service: Endoscopy;  Laterality: N/A;  . POLYPECTOMY  11/08/2017   Procedure: POLYPECTOMY;  Surgeon: Milus Banister, MD;  Location: [facility] ENDOSCOPY;  Service: Endoscopy;;    Current Outpatient Medications  Medication Sig Dispense Refill  . albuterol (PROVENTIL HFA;VENTOLIN HFA) 108 (90 BASE) MCG/ACT inhaler Inhale 2 puffs into the lungs every 6 (six) hours as needed for wheezing or shortness of breath. 1 Inhaler 3  . amiodarone (PACERONE) 200 MG tablet Take 200 mg by mouth at bedtime.     [name] BIDIL 20-37.5 MG tablet TAKE 1 TABLET BY MOUTH TWICE DAILY 180 tablet 3  . carvedilol (COREG) 25 MG tablet Take 25 mg by mouth 2 (two) times daily.  3  . ELIQUIS 5 MG TABS tablet TAKE ONE TABLET BY MOUTH TWICE DAILY 180 tablet 3  . furosemide (LASIX) 40 MG tablet Take 1 tablet (40 mg total) by mouth 2 (two) times daily. 60 tablet 0  . Hydrocortisone (GERHARDT'S BUTT CREAM) CREA Apply 1 application topically 2 (two)  times daily. 1 each 2  . levothyroxine (SYNTHROID, LEVOTHROID) 125 MCG tablet Take 125 mcg by mouth daily before breakfast.    . metolazone (ZAROXOLYN) 2.5 MG tablet Take 1 tablet (2.5 mg total) by mouth daily. Take one tablet, once per day for the next 2 days. 3 tablet 0  . nitroGLYCERIN (NITROSTAT) 0.4 MG SL tablet Place 0.4 mg under the tongue every 5 (five) minutes as needed for chest pain.    . pantoprazole (PROTONIX) 40 MG tablet Take 1 tablet (40 mg total) by mouth daily. 30 tablet 0  . sertraline (ZOLOFT) 50 MG tablet Take 1 tablet (50 mg total) by mouth daily. (Patient taking differently: Take 50 mg by mouth at bedtime. ) 30 tablet 3  . spironolactone (ALDACTONE) 25 MG tablet Take 0.5 tablets (12.5 mg total) by mouth daily. 90 tablet 3  . Tetrahydrozoline HCl (VISINE OP) Place 1  drop into both eyes daily as needed (DRY EYES).    [name] tiZANidine (ZANAFLEX) 2 MG tablet Take 1 tablet (2 mg total) by mouth at bedtime as needed for muscle spasms. 20 tablet 0  . traMADol (ULTRAM) 50 MG tablet Take 50 mg by mouth at bedtime as needed for severe pain.      No current facility-administered medications for this visit.     Allergies:   Patient has no known allergies.   Social History:  The patient  reports that she has never smoked. She has never used smokeless tobacco. She reports that she drinks alcohol. She reports that she does not use drugs.   Family History:  The patient's family history includes Asthma in her father and sister; Heart attack in her father and mother; Lung cancer in her sister; Stroke in her brother.  ROS:  Please see the history of present illness.   All other systems are reviewed and otherwise negative.   PHYSICAL EXAM:  VS:  BP 124/60   Pulse 60   Ht 4\' 11"  (1.499 m)   Wt 143 lb (64.9 kg)   BMI 28.88 kg/m  BMI: Body mass index is 28.88 kg/m. Well nourished, well developed, in no acute distress  HEENT: normocephalic, atraumatic  Neck: no JVD, carotid bruits or masses Cardiac:  RRR; no significant murmurs, no rubs, or gallops Lungs:  CTA b/l, no wheezing, rhonchi or rales  Abd: soft, nontender MS: no deformity or atrophy Ext:  no edema  Skin: warm and dry, no rash Neuro:  No gross deficits appreciated Psych: euthymic mood, full affect  ICD site is stable, no tethering or discomfort   EKG:  Not done today ICD interrogation done today and reviewed bymyself: battery and lead testing are good, no AFib or VT, Optivol is back to baseline, 100% VP  08/11/17: TTE Study Conclusions - Left ventricle: The cavity size was normal. Wall thickness was   normal. Systolic function was normal. The estimated ejection   fraction was in the range of 60% to 65%. Wall motion was normal;   there were no regional wall motion abnormalities. Features are    consistent with a pseudonormal left ventricular filling pattern,   with concomitant abnormal relaxation and increased filling   pressure (grade 2 diastolic dysfunction). - Aortic valve: There was moderate regurgitation. - Mitral valve: There was moderate regurgitation. - Left atrium: The atrium was mildly dilated. - Right atrium: The atrium was mildly dilated. - Tricuspid valve: There was moderate regurgitation. - Pulmonary arteries: Systolic pressure was moderately increased.   PA peak pressure: 49  mm Hg (S).  Recent Labs: 07/31/2017: B Natriuretic Peptide 547.9 11/08/2017: Magnesium 2.0 11/19/2017: TSH 2.09 11/23/2017: ALT 9 12/12/2017: BUN 52; Creatinine, Ser 2.84; Hemoglobin 8.5 Repeated and verified X2.; Platelets 181.0; Potassium 3.8; Sodium 139  No results found for requested labs within last 8760 hours.   Estimated Creatinine Clearance: 15.3 mL/min (A) (by C-G formula based on SCr of 2.84 mg/dL (H)).   Wt Readings from Last 3 Encounters:  12/24/17 143 lb (64.9 kg)  12/12/17 148 lb (67.1 kg)  11/27/17 144 lb 8 oz (65.5 kg)     Other studies reviewed: Additional studies/records reviewed today include: summarized above  ASSESSMENT AND PLAN:  1. Acute/Chronic CHF (diastolic)     Hx of NICM w/improved LVEF     Weight is down 5lbs, exam does not suggest fluid OL, Optivol looks good.     We discussed minimizing sodium, she reports she is good about this     Will keep the metolazone to use PRN for weight gain/swelling though limited to only once week, she is instructed to notify if needing to use with any regularity  2. CRT-D     Intact function, no changes made  3. Atypical Aflutter/ATach     CHA2DS2Vasc is 4, on Eliquis, appropriately dosed for age/weight     Recurrent anemia, following with GI, started iron transfusions, s/o capsule EGD today, GI is following H/H     Keep amio for now, though would like to stop once acute issues are better stabalized      4. HTN     Looks  good    Disposition: F/u with [name] in 2 months, sooner if needed.  Continue with L. Short/ICM clinic  Current medicines are reviewed at length with the patient today.  The patient did not have any concerns regarding medicines.  Venetia Night, PA-C 12/24/2017 3:26 PM     [address] [place] [address] [phone] (office)  [phone] (fax)

## 2017-12-24 ENCOUNTER — Ambulatory Visit: Payer: Medicare Other | Admitting: Physician Assistant

## 2017-12-24 ENCOUNTER — Encounter

## 2017-12-24 ENCOUNTER — Encounter: Payer: Self-pay | Admitting: Internal Medicine

## 2017-12-24 ENCOUNTER — Other Ambulatory Visit: Payer: Self-pay | Admitting: Family Medicine

## 2017-12-24 ENCOUNTER — Ambulatory Visit (INDEPENDENT_AMBULATORY_CARE_PROVIDER_SITE_OTHER): Payer: Medicare Other | Admitting: Internal Medicine

## 2017-12-24 VITALS — BP 124/60 | HR 60 | Ht 59.0 in | Wt 143.0 lb

## 2017-12-24 DIAGNOSIS — Z95810 Presence of automatic (implantable) cardiac defibrillator: Secondary | ICD-10-CM

## 2017-12-24 DIAGNOSIS — R195 Other fecal abnormalities: Secondary | ICD-10-CM

## 2017-12-24 DIAGNOSIS — I5032 Chronic diastolic (congestive) heart failure: Secondary | ICD-10-CM | POA: Diagnosis not present

## 2017-12-24 DIAGNOSIS — K921 Melena: Secondary | ICD-10-CM

## 2017-12-24 DIAGNOSIS — D500 Iron deficiency anemia secondary to blood loss (chronic): Secondary | ICD-10-CM

## 2017-12-24 DIAGNOSIS — D649 Anemia, unspecified: Secondary | ICD-10-CM

## 2017-12-24 DIAGNOSIS — I484 Atypical atrial flutter: Secondary | ICD-10-CM

## 2017-12-24 DIAGNOSIS — I428 Other cardiomyopathies: Secondary | ICD-10-CM

## 2017-12-24 DIAGNOSIS — Z1231 Encounter for screening mammogram for malignant neoplasm of breast: Secondary | ICD-10-CM

## 2017-12-24 DIAGNOSIS — K5520 Angiodysplasia of colon without hemorrhage: Secondary | ICD-10-CM

## 2017-12-24 MED ORDER — METOLAZONE 2.5 MG PO TABS: 2.5000 mg | ORAL_TABLET | ORAL | 3 refills | Status: DC

## 2017-12-24 NOTE — Progress Notes (Signed)
You may have clear liquids beginning at 10:30 am after ingesting the capsule.    At 1:00 pm mix 17 GMs of Miralax in 8 oz of liquid,  of your choice,  and drink. You may have a light lunch of half a sandwich and a bowl of soup. You may have a normal dinner after 5:00 pm.  No further dietary restrictions are necessary.  You should pass the capsule in your stool 8-48 hours after ingestion. If you have not passed the capsule, after 72 hours, please contact the office at [phone].  Please watch the retrieval video at [email or web address].  It is imperative to retrieve the capsule to avoid repeating the test.  Please keep your capsule retrieval kit with you at all times when visiting the restroom until you have retrieved the capsule.   Please follow the instructions for returning the capsule:  Place the retrieved capsule from the take-home retrieval kit in the provided vial.  Ensure the vial lid is locked and put the vial into the envelope, seal the pre-labeled envelope, and drop if off at any FedEx drop box or physical FedEx location.     Patient swallowed the capsule without difficulty. She verbalized understanding of all instructions given.  Serial # [id number] Lot # [id number] Exp Date 2020-20-23

## 2017-12-24 NOTE — Patient Instructions (Addendum)
Medication Instructions:   Your physician recommends that you continue on your current medications as directed. Please refer to the Current Medication list given to you today.   If you need a refill on your cardiac medications before your next appointment, please call your pharmacy.  Labwork:  BMET TODAY    Testing/Procedures: NONE ORDERED  TODAY    Follow-Up: IN 2 MONTHS  WITH DR Caryl Comes    Any Other Special Instructions Will Be Listed Below (If Applicable).

## 2017-12-25 LAB — BASIC METABOLIC PANEL
BUN/Creatinine Ratio: 21 (ref 12–28)
BUN: 72 mg/dL — AB (ref 8–27)
CALCIUM: 9.7 mg/dL (ref 8.7–10.3)
CHLORIDE: 89 mmol/L — AB (ref 96–106)
CO2: 24 mmol/L (ref 20–29)
Creatinine, Ser: 3.36 mg/dL — ABNORMAL HIGH (ref 0.57–1.00)
GFR calc Af Amer: 15 mL/min/{1.73_m2} — ABNORMAL LOW (ref >59)
GFR calc non Af Amer: 13 mL/min/{1.73_m2} — ABNORMAL LOW (ref >59)
GLUCOSE: 123 mg/dL — AB (ref 65–99)
POTASSIUM: 4 mmol/L (ref 3.5–5.2)
Sodium: 136 mmol/L (ref 134–144)

## 2017-12-25 NOTE — Progress Notes (Signed)
Next ICM remote transmission scheduled for 01/28/2018.

## 2017-12-26 ENCOUNTER — Telehealth: Payer: Self-pay | Admitting: *Deleted

## 2017-12-26 NOTE — Telephone Encounter (Signed)
-----   Message from [place], [place] sent at 12/26/2017  2:22 PM EDT ----- Her kidney function looks a little worse then her baseline after the extra diuretics.  Please confirm who her nephrologist is and forward the lab to them and chart please and let me know.  Instruct her no PRN diuretic this week.  Thanks [employer]

## 2017-12-26 NOTE — Telephone Encounter (Signed)
SPOKE WITH PATIENT AND AWARE OF RESULTS AND TO NOT TAKE ANY EXTRA DIURETICS   NEPHROLOGIST DR  Lorrene Reid AT [facility] WAS  ROUTED  LAB RESULTS.

## 2017-12-28 ENCOUNTER — Other Ambulatory Visit: Payer: Self-pay | Admitting: Physician Assistant

## 2017-12-28 DIAGNOSIS — I130 Hypertensive heart and chronic kidney disease with heart failure and stage 1 through stage 4 chronic kidney disease, or unspecified chronic kidney disease: Secondary | ICD-10-CM

## 2017-12-28 DIAGNOSIS — N183 Chronic kidney disease, stage 3 unspecified: Secondary | ICD-10-CM

## 2017-12-31 ENCOUNTER — Other Ambulatory Visit: Payer: Medicare Other | Admitting: *Deleted

## 2017-12-31 DIAGNOSIS — N183 Chronic kidney disease, stage 3 unspecified: Secondary | ICD-10-CM

## 2017-12-31 LAB — BASIC METABOLIC PANEL
BUN/Creatinine Ratio: 28 (ref 12–28)
BUN: 99 mg/dL (ref 8–27)
CALCIUM: 10.1 mg/dL (ref 8.7–10.3)
CHLORIDE: 98 mmol/L (ref 96–106)
CO2: 22 mmol/L (ref 20–29)
Creatinine, Ser: 3.48 mg/dL — ABNORMAL HIGH (ref 0.57–1.00)
GFR calc Af Amer: 15 mL/min/{1.73_m2} — ABNORMAL LOW (ref >59)
GFR, EST NON AFRICAN AMERICAN: 13 mL/min/{1.73_m2} — AB (ref >59)
GLUCOSE: 109 mg/dL — AB (ref 65–99)
POTASSIUM: 4.8 mmol/L (ref 3.5–5.2)
Sodium: 138 mmol/L (ref 134–144)

## 2018-01-01 ENCOUNTER — Telehealth: Payer: Self-pay

## 2018-01-01 MED ORDER — FUROSEMIDE 40 MG PO TABS: 40.0000 mg | ORAL_TABLET | Freq: Every day | ORAL | 0 refills | Status: DC

## 2018-01-01 NOTE — Telephone Encounter (Signed)
Spoke with pt and reminded pt of remote transmission that is due today. Pt verbalized understanding.  She reported feeling much better since her office appt with [name], PA.  Breathing and weight have returned to baseline.  Weight 140-142 lbs.  Fatigue has improved but not completely resolved.  She will send remote transmission in today for review.

## 2018-01-01 NOTE — Telephone Encounter (Signed)
Per Dr Caryl Comes, pt is to only restart 40mg  lasix once per day. Pt understands Dr Caryl Comes will be in touch with her renal dr for further recommendation. Pt verbalized understanding and had no additional questions.

## 2018-01-02 NOTE — Progress Notes (Signed)
No ICM remote transmission received for 01/01/2018 and next ICM transmission scheduled for 01/28/2018.    

## 2018-01-03 ENCOUNTER — Other Ambulatory Visit: Payer: Self-pay

## 2018-01-03 ENCOUNTER — Telehealth: Payer: Self-pay

## 2018-01-03 ENCOUNTER — Encounter (HOSPITAL_COMMUNITY): Payer: Self-pay | Admitting: Emergency Medicine

## 2018-01-03 ENCOUNTER — Inpatient Hospital Stay (HOSPITAL_COMMUNITY)
Admission: EM | Admit: 2018-01-03 | Discharge: 2018-01-05 | DRG: 378 | Disposition: A | Discharge status: Home / Self Care | Payer: Medicare Other | Attending: Family Medicine | Admitting: Family Medicine

## 2018-01-03 DIAGNOSIS — D631 Anemia in chronic kidney disease: Secondary | ICD-10-CM | POA: Diagnosis present

## 2018-01-03 DIAGNOSIS — Z801 Family history of malignant neoplasm of trachea, bronchus and lung: Secondary | ICD-10-CM | POA: Diagnosis not present

## 2018-01-03 DIAGNOSIS — E1129 Type 2 diabetes mellitus with other diabetic kidney complication: Secondary | ICD-10-CM | POA: Diagnosis present

## 2018-01-03 DIAGNOSIS — D62 Acute posthemorrhagic anemia: Secondary | ICD-10-CM | POA: Diagnosis present

## 2018-01-03 DIAGNOSIS — G4733 Obstructive sleep apnea (adult) (pediatric): Secondary | ICD-10-CM | POA: Diagnosis present

## 2018-01-03 DIAGNOSIS — K5521 Angiodysplasia of colon with hemorrhage: Secondary | ICD-10-CM | POA: Diagnosis not present

## 2018-01-03 DIAGNOSIS — I5032 Chronic diastolic (congestive) heart failure: Secondary | ICD-10-CM | POA: Diagnosis not present

## 2018-01-03 DIAGNOSIS — D649 Anemia, unspecified: Secondary | ICD-10-CM

## 2018-01-03 DIAGNOSIS — Z8249 Family history of ischemic heart disease and other diseases of the circulatory system: Secondary | ICD-10-CM | POA: Diagnosis not present

## 2018-01-03 DIAGNOSIS — I130 Hypertensive heart and chronic kidney disease with heart failure and stage 1 through stage 4 chronic kidney disease, or unspecified chronic kidney disease: Secondary | ICD-10-CM | POA: Diagnosis not present

## 2018-01-03 DIAGNOSIS — E039 Hypothyroidism, unspecified: Secondary | ICD-10-CM | POA: Diagnosis present

## 2018-01-03 DIAGNOSIS — I447 Left bundle-branch block, unspecified: Secondary | ICD-10-CM | POA: Diagnosis not present

## 2018-01-03 DIAGNOSIS — K922 Gastrointestinal hemorrhage, unspecified: Secondary | ICD-10-CM

## 2018-01-03 DIAGNOSIS — I4892 Unspecified atrial flutter: Secondary | ICD-10-CM | POA: Diagnosis not present

## 2018-01-03 DIAGNOSIS — E1122 Type 2 diabetes mellitus with diabetic chronic kidney disease: Secondary | ICD-10-CM | POA: Diagnosis not present

## 2018-01-03 DIAGNOSIS — K31811 Angiodysplasia of stomach and duodenum with bleeding: Secondary | ICD-10-CM | POA: Diagnosis not present

## 2018-01-03 DIAGNOSIS — Z79890 Hormone replacement therapy (postmenopausal): Secondary | ICD-10-CM

## 2018-01-03 DIAGNOSIS — Z950 Presence of cardiac pacemaker: Secondary | ICD-10-CM

## 2018-01-03 DIAGNOSIS — I5042 Chronic combined systolic (congestive) and diastolic (congestive) heart failure: Secondary | ICD-10-CM | POA: Diagnosis present

## 2018-01-03 DIAGNOSIS — I482 Chronic atrial fibrillation, unspecified: Secondary | ICD-10-CM | POA: Diagnosis present

## 2018-01-03 DIAGNOSIS — I10 Essential (primary) hypertension: Secondary | ICD-10-CM

## 2018-01-03 DIAGNOSIS — M8580 Other specified disorders of bone density and structure, unspecified site: Secondary | ICD-10-CM | POA: Diagnosis not present

## 2018-01-03 DIAGNOSIS — E1169 Type 2 diabetes mellitus with other specified complication: Secondary | ICD-10-CM | POA: Diagnosis not present

## 2018-01-03 DIAGNOSIS — I428 Other cardiomyopathies: Secondary | ICD-10-CM | POA: Diagnosis not present

## 2018-01-03 DIAGNOSIS — M1990 Unspecified osteoarthritis, unspecified site: Secondary | ICD-10-CM | POA: Diagnosis present

## 2018-01-03 DIAGNOSIS — Z823 Family history of stroke: Secondary | ICD-10-CM | POA: Diagnosis not present

## 2018-01-03 DIAGNOSIS — E785 Hyperlipidemia, unspecified: Secondary | ICD-10-CM | POA: Diagnosis present

## 2018-01-03 DIAGNOSIS — E1159 Type 2 diabetes mellitus with other circulatory complications: Secondary | ICD-10-CM | POA: Diagnosis present

## 2018-01-03 DIAGNOSIS — I152 Hypertension secondary to endocrine disorders: Secondary | ICD-10-CM | POA: Diagnosis present

## 2018-01-03 DIAGNOSIS — Z825 Family history of asthma and other chronic lower respiratory diseases: Secondary | ICD-10-CM | POA: Diagnosis not present

## 2018-01-03 DIAGNOSIS — H409 Unspecified glaucoma: Secondary | ICD-10-CM | POA: Diagnosis present

## 2018-01-03 DIAGNOSIS — D500 Iron deficiency anemia secondary to blood loss (chronic): Secondary | ICD-10-CM

## 2018-01-03 DIAGNOSIS — Z7901 Long term (current) use of anticoagulants: Secondary | ICD-10-CM | POA: Diagnosis not present

## 2018-01-03 DIAGNOSIS — N179 Acute kidney failure, unspecified: Secondary | ICD-10-CM | POA: Diagnosis present

## 2018-01-03 DIAGNOSIS — K921 Melena: Secondary | ICD-10-CM

## 2018-01-03 DIAGNOSIS — N184 Chronic kidney disease, stage 4 (severe): Secondary | ICD-10-CM | POA: Diagnosis not present

## 2018-01-03 HISTORY — DX: Personal history of other medical treatment: Z92.89

## 2018-01-03 HISTORY — DX: Gastrointestinal hemorrhage, unspecified: K92.2

## 2018-01-03 HISTORY — DX: Pneumonia, unspecified organism: J18.9

## 2018-01-03 HISTORY — DX: Chronic kidney disease, unspecified: N18.9

## 2018-01-03 HISTORY — DX: Unspecified chronic bronchitis: J42

## 2018-01-03 HISTORY — DX: Unspecified atrial fibrillation: I48.91

## 2018-01-03 LAB — COMPREHENSIVE METABOLIC PANEL
ALK PHOS: 55 U/L (ref 38–126)
ALT: 18 U/L (ref 0–44)
ANION GAP: 13 (ref 5–15)
AST: 27 U/L (ref 15–41)
Albumin: 4.4 g/dL (ref 3.5–5.0)
BILIRUBIN TOTAL: 0.7 mg/dL (ref 0.3–1.2)
BUN: 96 mg/dL — ABNORMAL HIGH (ref 8–23)
CALCIUM: 9.6 mg/dL (ref 8.9–10.3)
CO2: 19 mmol/L — ABNORMAL LOW (ref 22–32)
Chloride: 106 mmol/L (ref 98–111)
Creatinine, Ser: 3.16 mg/dL — ABNORMAL HIGH (ref 0.44–1.00)
GFR, EST AFRICAN AMERICAN: 16 mL/min — AB (ref >60)
GFR, EST NON AFRICAN AMERICAN: 14 mL/min — AB (ref >60)
Glucose, Bld: 112 mg/dL — ABNORMAL HIGH (ref 70–99)
Potassium: 4.5 mmol/L (ref 3.5–5.1)
Sodium: 138 mmol/L (ref 135–145)
TOTAL PROTEIN: 8.6 g/dL — AB (ref 6.5–8.1)

## 2018-01-03 LAB — CBC
HCT: 25.9 % — ABNORMAL LOW (ref 36.0–46.0)
HCT: 25.2 % — ABNORMAL LOW (ref 36.0–46.0)
Hemoglobin: 8 g/dL — ABNORMAL LOW (ref 12.0–15.0)
Hemoglobin: 7.9 g/dL — ABNORMAL LOW (ref 12.0–15.0)
MCH: 28.4 pg (ref 26.0–34.0)
MCH: 28.5 pg (ref 26.0–34.0)
MCHC: 30.9 g/dL (ref 30.0–36.0)
MCHC: 31.3 g/dL (ref 30.0–36.0)
MCV: 91.8 fL (ref 78.0–100.0)
MCV: 91 fL (ref 78.0–100.0)
PLATELETS: 215 10*3/uL (ref 150–400)
Platelets: 239 10*3/uL (ref 150–400)
RBC: 2.82 MIL/uL — AB (ref 3.87–5.11)
RBC: 2.77 MIL/uL — ABNORMAL LOW (ref 3.87–5.11)
RDW: 16.2 % — ABNORMAL HIGH (ref 11.5–15.5)
RDW: 16.1 % — AB (ref 11.5–15.5)
WBC: 9 10*3/uL (ref 4.0–10.5)
WBC: 8 10*3/uL (ref 4.0–10.5)

## 2018-01-03 LAB — GLUCOSE, CAPILLARY: GLUCOSE-CAPILLARY: 147 mg/dL — AB (ref 70–99)

## 2018-01-03 LAB — PROTIME-INR
INR: 1.52
PROTHROMBIN TIME: 18.2 s — AB (ref 11.4–15.2)

## 2018-01-03 LAB — POC OCCULT BLOOD, ED: FECAL OCCULT BLD: POSITIVE — AB

## 2018-01-03 LAB — ABO/RH: ABO/RH(D): O POS

## 2018-01-03 MED ORDER — SERTRALINE HCL 50 MG PO TABS
50.0000 mg | ORAL_TABLET | Freq: Every day | ORAL | Status: DC
  Administered 2018-01-04 – 2018-01-05 (×2): 50 mg via ORAL
  Filled 2018-01-03 (×2): qty 1

## 2018-01-03 MED ORDER — MORPHINE SULFATE (PF) 2 MG/ML IV SOLN
2.0000 mg | INTRAVENOUS | Status: DC | PRN
  Administered 2018-01-03: 2 mg via INTRAVENOUS
  Filled 2018-01-03: qty 1

## 2018-01-03 MED ORDER — FENTANYL CITRATE (PF) 100 MCG/2ML IJ SOLN: 12.5000 ug | INTRAMUSCULAR | Status: DC | PRN

## 2018-01-03 MED ORDER — CARVEDILOL 12.5 MG PO TABS
25.0000 mg | ORAL_TABLET | Freq: Two times a day (BID) | ORAL | Status: DC
  Administered 2018-01-03 – 2018-01-05 (×4): 25 mg via ORAL
  Filled 2018-01-03 (×4): qty 2

## 2018-01-03 MED ORDER — ONDANSETRON HCL 4 MG/2ML IJ SOLN: 4.0000 mg | Freq: Four times a day (QID) | INTRAMUSCULAR | Status: DC | PRN

## 2018-01-03 MED ORDER — MORPHINE SULFATE (PF) 2 MG/ML IV SOLN
2.0000 mg | INTRAVENOUS | Status: DC | PRN
  Administered 2018-01-03 – 2018-01-05 (×4): 2 mg via INTRAVENOUS
  Filled 2018-01-03 (×4): qty 1

## 2018-01-03 MED ORDER — HYDRALAZINE HCL 20 MG/ML IJ SOLN
5.0000 mg | Freq: Once | INTRAMUSCULAR | Status: AC
  Administered 2018-01-03: 5 mg via INTRAVENOUS
  Filled 2018-01-03: qty 1

## 2018-01-03 MED ORDER — INSULIN ASPART 100 UNIT/ML ~~LOC~~ SOLN
0.0000 [IU] | Freq: Three times a day (TID) | SUBCUTANEOUS | Status: DC
  Administered 2018-01-05: 2 [IU] via SUBCUTANEOUS

## 2018-01-03 MED ORDER — LACTATED RINGERS IV SOLN
INTRAVENOUS | Status: DC
  Administered 2018-01-03 – 2018-01-05 (×4): via INTRAVENOUS

## 2018-01-03 MED ORDER — LEVOTHYROXINE SODIUM 125 MCG PO TABS
125.0000 ug | ORAL_TABLET | Freq: Every day | ORAL | Status: DC
  Administered 2018-01-04 – 2018-01-05 (×2): 125 ug via ORAL
  Filled 2018-01-03 (×2): qty 1

## 2018-01-03 MED ORDER — PANTOPRAZOLE SODIUM 40 MG IV SOLR
40.0000 mg | Freq: Once | INTRAVENOUS | Status: DC
  Filled 2018-01-03: qty 40

## 2018-01-03 MED ORDER — ACETAMINOPHEN 325 MG PO TABS
650.0000 mg | ORAL_TABLET | Freq: Four times a day (QID) | ORAL | Status: DC | PRN
  Filled 2018-01-03: qty 2

## 2018-01-03 MED ORDER — PANTOPRAZOLE SODIUM 40 MG PO TBEC
40.0000 mg | DELAYED_RELEASE_TABLET | Freq: Every day | ORAL | Status: DC
  Administered 2018-01-04 – 2018-01-05 (×2): 40 mg via ORAL
  Filled 2018-01-03 (×2): qty 1

## 2018-01-03 MED ORDER — ONDANSETRON HCL 4 MG PO TABS: 4.0000 mg | ORAL_TABLET | Freq: Four times a day (QID) | ORAL | Status: DC | PRN

## 2018-01-03 MED ORDER — ACETAMINOPHEN 650 MG RE SUPP: 650.0000 mg | Freq: Four times a day (QID) | RECTAL | Status: DC | PRN

## 2018-01-03 MED ORDER — ISOSORB DINITRATE-HYDRALAZINE 20-37.5 MG PO TABS
1.0000 | ORAL_TABLET | Freq: Two times a day (BID) | ORAL | Status: DC
  Administered 2018-01-03 – 2018-01-05 (×4): 1 via ORAL
  Filled 2018-01-03 (×4): qty 1

## 2018-01-03 MED ORDER — AMIODARONE HCL 200 MG PO TABS
200.0000 mg | ORAL_TABLET | Freq: Every day | ORAL | Status: DC
  Administered 2018-01-03 – 2018-01-04 (×2): 200 mg via ORAL
  Filled 2018-01-03 (×2): qty 1

## 2018-01-03 MED ORDER — PANTOPRAZOLE SODIUM 40 MG IV SOLR: 40.0000 mg | Freq: Two times a day (BID) | INTRAVENOUS | Status: DC

## 2018-01-03 NOTE — ED Provider Notes (Signed)
[facility] EMERGENCY DEPARTMENT Provider Note   CSN: [id number] Arrival date & time: 01/03/18  1047     History   Chief Complaint Chief Complaint  Patient presents with  . Abdominal Pain  . Dark Tarry Stool    HPI Morgan Clay is a 72 y.o. female.  72 yo F with a chief complaint of dark and tarry stools.  Patient has unfortunately had a history of chronic GI bleed going on for the past 5 months or so.  She has been seen by [facility] and worked up for this.  Recently had an endoscopy back at the beginning of July.  She was doing so much better and had her stool turned to more of a brownish color when couple days ago she started having recurrence of her dark black stools.  The history is provided by the patient.  Abdominal Pain   This is a new problem. The current episode started yesterday. The problem occurs constantly. The problem has not changed since onset.The quality of the pain is aching and cramping. The pain is at a severity of 7/10. The pain is severe. Pertinent negatives include fever, nausea, vomiting, dysuria, headaches, arthralgias and myalgias. Nothing aggravates the symptoms. Nothing relieves the symptoms.    Past Medical History:  Diagnosis Date  . Anemia   . Arthritis   . Asthma    reports mild asthma since childhood - had COPD on dx list from prior PCP  . Atypical atrial flutter ([place]) 09/28/2015  . Chronic systolic congestive heart failure ([place])   . DEPRESSION 12/17/2009   Annotation: PHQ-9 score = 14 done on 12/17/2009 Qualifier: Diagnosis of  By: Hassell Done FNP, Tori Milks    . Diverticulosis   . FIBROIDS, UTERUS 03/05/2008  . GI bleed   . Glaucoma   . HEPATITIS C - s/p treatment with Harvoni, seeing hepatology, Dawn Drazek 01/04/2007  . Hyperlipemia 12/06/2012  . HYPERTENSION, BENIGN 04/24/2007  . Hypothyroidism   . LBBB (left bundle branch block)   . Nonischemic cardiomyopathy ([place])   . OBESITY 05/27/2009  . OBSTRUCTIVE SLEEP APNEA  11/14/2007   npsg 2009:  Mild osa with AHI 11/hr. Started cpap 2009, quit using due to lack of response Home sleep testing 09/2010 with AHI only 7/hr and weight loss advised by pulmonology.  . OSTEOPENIA 09/30/2008  . Personal history of other infectious and parasitic disease    Hepatitis B    Patient Active Problem List   Diagnosis Date Noted  . Heme positive stool 12/12/2017  . Polyp of transverse colon   . Diverticulosis of colon without hemorrhage   . Melena 11/06/2017  . Acute GI bleeding 10/25/2017  . Anemia 10/25/2017  . Atrial fibrillation, chronic ([place]) 10/25/2017  . Aortic atherosclerosis ([place]) 08/30/2017  . Pulmonary edema, acute ([place]) 08/09/2017  . Pain and swelling of left upper extremity 08/08/2017  . ARF (acute renal failure) ([place])   . Enteritis   . Partial small bowel obstruction ([place])   . Diverticulitis 07/31/2017  . Persistent atrial fibrillation ([place])   . Hypertension associated with diabetes ([place]) 06/07/2017  . MDD (major depressive disorder), recurrent, in partial remission ([place]) 06/07/2017  . Asthma 05/22/2017  . Chronic obstructive pulmonary disease ([place])   . Atypical atrial flutter ([place]) 09/28/2015  . Type 2 diabetes, controlled, with renal manifestation ([place]) 11/24/2013  . Chronic kidney disease - followed by [facility] 11/24/2013  . Nonischemic cardiomyopathy ([place]) 11/22/2010  . Obesity 05/27/2009  . OSTEOPENIA 09/30/2008  .  OBSTRUCTIVE SLEEP APNEA 11/14/2007  . Chronic diastolic CHF (congestive heart failure) ([place]) 04/24/2007  . Hypothyroidism 01/28/2007  . Biventricular ICD (implantable cardioverter-defibrillator) in place 01/08/2007    Past Surgical History:  Procedure Laterality Date  . appendectomy    . CARDIAC CATHETERIZATION    . CARDIOVERSION N/A 12/14/2014   Procedure: CARDIOVERSION;  Surgeon: Dorothy Spark, MD;  Location: [facility] ENDOSCOPY;  Service: Cardiovascular;  Laterality: N/A;  . CARDIOVERSION N/A 02/26/2017   Procedure:  CARDIOVERSION;  Surgeon: Evans Lance, MD;  Location: [facility] CV LAB;  Service: Cardiovascular;  Laterality: N/A;  . CARDIOVERSION N/A 07/24/2017   Procedure: CARDIOVERSION;  Surgeon: Thayer Headings, MD;  Location: [facility] ENDOSCOPY;  Service: Cardiovascular;  Laterality: N/A;  . COLONOSCOPY N/A 11/08/2017   Procedure: COLONOSCOPY;  Surgeon: Milus Banister, MD;  Location: [facility] ENDOSCOPY;  Service: Endoscopy;  Laterality: N/A;  . EP IMPLANTABLE DEVICE N/A 10/06/2015   Procedure: BIV ICD Generator Changeout;  Surgeon: [name], MD;  Location: [facility] CV LAB;  Service: Cardiovascular;  Laterality: N/A;  . ESOPHAGOGASTRODUODENOSCOPY N/A 10/26/2017   Procedure: ESOPHAGOGASTRODUODENOSCOPY (EGD);  Surgeon: Ladene Artist, MD;  Location: [facility] ENDOSCOPY;  Service: Endoscopy;  Laterality: N/A;  . ESOPHAGOGASTRODUODENOSCOPY (EGD) WITH PROPOFOL N/A 11/07/2017   Procedure: ESOPHAGOGASTRODUODENOSCOPY (EGD) WITH PROPOFOL;  Surgeon: Milus Banister, MD;  Location: [facility] ENDOSCOPY;  Service: Endoscopy;  Laterality: N/A;  . HOT HEMOSTASIS N/A 10/26/2017   Procedure: HOT HEMOSTASIS (ARGON PLASMA COAGULATION/BICAP);  Surgeon: Ladene Artist, MD;  Location: [facility] ENDOSCOPY;  Service: Endoscopy;  Laterality: N/A;  . POLYPECTOMY  11/08/2017   Procedure: POLYPECTOMY;  Surgeon: Milus Banister, MD;  Location: [facility] ENDOSCOPY;  Service: Endoscopy;;     OB History   None      Home Medications    Prior to Admission medications   Medication Sig Start Date End Date Taking? Authorizing Provider  albuterol (PROVENTIL HFA;VENTOLIN HFA) 108 (90 BASE) MCG/ACT inhaler Inhale 2 puffs into the lungs every 6 (six) hours as needed for wheezing or shortness of breath. 01/01/15   [name], DO  amiodarone (PACERONE) 200 MG tablet Take 200 mg by mouth at bedtime.     [provider]  BIDIL 20-37.5 MG tablet TAKE 1 TABLET BY MOUTH TWICE DAILY 04/26/17   [name], MD  carvedilol (COREG) 25 MG tablet Take 25 mg by  mouth 2 (two) times daily. 12/17/17   [provider]  ELIQUIS 5 MG TABS tablet TAKE ONE TABLET BY MOUTH TWICE DAILY 07/11/17   [name], MD  furosemide (LASIX) 40 MG tablet Take 1 tablet (40 mg total) by mouth daily. 01/01/18   [name], MD  Hydrocortisone (GERHARDT'S BUTT CREAM) CREA Apply 1 application topically 2 (two) times daily. 08/17/17   Hosie Poisson, MD  levothyroxine (SYNTHROID, LEVOTHROID) 125 MCG tablet Take 125 mcg by mouth daily before breakfast.    [provider]  metolazone (ZAROXOLYN) 2.5 MG tablet Take 1 tablet (2.5 mg total) by mouth once a week. FOR SWELLING OF WEIGHT GAIN 12/24/17 03/24/18  Baldwin Jamaica, PA-C  nitroGLYCERIN (NITROSTAT) 0.4 MG SL tablet Place 0.4 mg under the tongue every 5 (five) minutes as needed for chest pain.    [provider]  pantoprazole (PROTONIX) 40 MG tablet Take 1 tablet (40 mg total) by mouth daily. 10/26/17   Georgette Shell, MD  sertraline (ZOLOFT) 50 MG tablet Take 1 tablet (50 mg total) by mouth daily. Patient  taking differently: Take 50 mg by mouth at bedtime.  06/26/17   [name], DO  Tetrahydrozoline HCl (VISINE OP) Place 1 drop into both eyes daily as needed (DRY EYES).    [provider]  tiZANidine (ZANAFLEX) 2 MG tablet Take 1 tablet (2 mg total) by mouth at bedtime as needed for muscle spasms. 10/09/17   [name], DO  traMADol (ULTRAM) 50 MG tablet Take 50 mg by mouth at bedtime as needed for severe pain.     [provider]    Family History Family History  Problem Relation Age of Onset  . Asthma Father   . Heart attack Father   . Asthma Sister   . Lung cancer Sister   . Heart attack Mother   . Stroke Brother   . Colon cancer Neg Hx   . Esophageal cancer Neg Hx   . Pancreatic cancer Neg Hx   . Stomach cancer Neg Hx   . Liver disease Neg Hx     Social History Social History   Tobacco Use  . Smoking status: Never Smoker  . Smokeless tobacco: Never  Used  Substance Use Topics  . Alcohol use: Yes    Comment: occasional  . Drug use: No     Allergies   Patient has no known allergies.   Review of Systems Review of Systems  Constitutional: Negative for chills and fever.  HENT: Negative for congestion and rhinorrhea.   Eyes: Negative for redness and visual disturbance.  Respiratory: Negative for shortness of breath and wheezing.   Cardiovascular: Negative for chest pain and palpitations.  Gastrointestinal: Positive for abdominal pain and blood in stool. Negative for nausea and vomiting.  Genitourinary: Negative for dysuria and urgency.  Musculoskeletal: Negative for arthralgias and myalgias.  Skin: Negative for pallor and wound.  Neurological: Positive for syncope (near) and light-headedness. Negative for dizziness and headaches.     Physical Exam Updated Vital Signs BP (!) 148/48   Pulse 60   Temp 97.9 F (36.6 C) (Oral)   Resp 16   SpO2 100%   Physical Exam  Constitutional: She is oriented to person, place, and time. She appears well-developed and well-nourished. No distress.  HENT:  Head: Normocephalic and atraumatic.  Eyes: Pupils are equal, round, and reactive to light. EOM are normal.  Neck: Normal range of motion. Neck supple.  Cardiovascular: Normal rate and regular rhythm. Exam reveals no gallop and no friction rub.  No murmur heard. Pulmonary/Chest: Effort normal. She has no wheezes. She has no rales.  Abdominal: Soft. She exhibits no distension and no mass. There is no tenderness. There is no guarding.  Genitourinary:  Genitourinary Comments: No hemorrhoids, no stool in the vault.  Heme positive.  Musculoskeletal: She exhibits no edema or tenderness.  Neurological: She is alert and oriented to person, place, and time.  Skin: Skin is warm and dry. She is not diaphoretic.  Psychiatric: She has a normal mood and affect. Her behavior is normal.  Nursing note and vitals reviewed.    ED Treatments / Results   Labs (all labs ordered are listed, but only abnormal results are displayed) Labs Reviewed  COMPREHENSIVE METABOLIC PANEL - Abnormal; Notable for the following components:      Result Value   CO2 19 (*)    Glucose, Bld 112 (*)    BUN 96 (*)    Creatinine, Ser 3.16 (*)    Total Protein 8.6 (*)    GFR calc non Af  Amer 14 (*)    GFR calc Af Amer 16 (*)    All other components within normal limits  CBC - Abnormal; Notable for the following components:   RBC 2.82 (*)    Hemoglobin 8.0 (*)    HCT 25.9 (*)    RDW 16.2 (*)    All other components within normal limits  PROTIME-INR - Abnormal; Notable for the following components:   Prothrombin Time 18.2 (*)    All other components within normal limits  POC OCCULT BLOOD, ED - Abnormal; Notable for the following components:   Fecal Occult Bld POSITIVE (*)    All other components within normal limits  TYPE AND SCREEN  ABO/RH    EKG None  Radiology No results found.  Procedures Procedures (including critical care time)  Medications Ordered in ED Medications  pantoprazole (PROTONIX) injection 40 mg (has no administration in time range)     Initial Impression / Assessment and Plan / ED Course  I have reviewed the triage vital signs and the nursing notes.  Pertinent labs & imaging results that were available during my care of the patient were reviewed by me and considered in my medical decision making (see chart for details).     72 yo F with a chief complaint of lightheaded dizzy feeling as can a pass out.  She had an event like this happened to her earlier today.  She is also been having increased bowel movements in her stool went from a brownish color to dark black.  She is been having an issue with a chronic GI bleed for the past 5 months.  She has been seen by [facility] gastroenterology and recently had a capsule endoscopy.  She is unsure what the results were.  Her hemoglobin is mildly below its baseline here.  Like she went  from about 9.5 to 8.  She also has had a small elevation of her BUN.  Seems consistent with an upper GI bleed.  I discussed case with [facility] who will come and evaluate the patient.  Given a dose of Protonix.  Will discuss with the hospitalist.  The patients results and plan were reviewed and discussed.   Any x-rays performed were independently reviewed by myself.   Differential diagnosis were considered with the presenting HPI.  Medications  pantoprazole (PROTONIX) injection 40 mg (has no administration in time range)    Vitals:   01/03/18 1330 01/03/18 1345 01/03/18 1400 01/03/18 1430  BP: (!) 147/46 (!) 140/44 (!) 147/46 (!) 148/48  Pulse: (!) 59 60 (!) 59 60  Resp: 17 19 (!) 26 16  Temp:      TempSrc:      SpO2: 100% 100% 100% 100%    Final diagnoses:  Upper GI bleed    Admission/ observation were discussed with the admitting physician, patient and/or family and they are comfortable with the plan.      Final Clinical Impressions(s) / ED Diagnoses   Final diagnoses:  Upper GI bleed    ED Discharge Orders    None       Deno Etienne, DO 01/03/18 1554

## 2018-01-03 NOTE — ED Triage Notes (Signed)
Pt presents with RLQ abdominal pain with dark stool ongoing since march. She reports having multiple EGDs and recently having a camera test. Pain toady is more severe than previously. She is now reports dizziness. A/O at triage.

## 2018-01-03 NOTE — Telephone Encounter (Signed)
Spoke with pt today to schedule follow up labs. Pt states today she isn't feeling well. Her stomach aches and she is having black stools. She states when she had a bowel movement today, she felt SOB, diaphoretic, and dizzy. I heavily urged pt to seek medical attention in the ED. I told pt given her situation with her heart failure, kidney failure, chronic anemia, and fluid volume overload I believed she would benefit the most if evaluated by the ED.  I advised her to go to [facility] as our cardiology group is there who could assess her cardiovascular/fluid status as well as GI. Pt has agreed to proceed to the ED. I will call her later in the day to check on her status.

## 2018-01-03 NOTE — Telephone Encounter (Signed)
-----   Message from Baldwin Jamaica, [place] sent at 01/02/2018  7:59 AM EDT ----- Morgan Clay  Thanks for helping with [name].  I saw your note from yesterday.  In d/w Dr. Caryl Comes, I understood we were stopping her aldactone as well.  I did not see this in her direction/phone note.    Can you call her and stop the aldactone too and see how her BMET looks on Monday.  Thanks Again  renee

## 2018-01-03 NOTE — H&P (Signed)
History and Physical    Morgan Clay CZY:[id number] DOB: 1945/08/19 DOA: 01/03/2018  PCP: Morgan Kern, DO Consultants:  [facility]; Caryl Comes - cardiology; [facility] Patient coming from:  Home - lives alone; NOK: Daughter, [name], [phone]  Chief Complaint:  GI bleeding  HPI: Morgan Clay is a 72 y.o. female with medical history significant of OSA; NICM with chronic systolic CHF; hypothyroidism; HTN; HLD; Hep C s/p treatment with Harvoni; and atrial flutter presenting with GI bleeding.  "Same thing since March.  It's like I'm here every month, sometimes twice a month."  She came in March because they thought she had infection on her colon.  EGD in June (benign esophageal stenosis; single non-bleeding angiodysplastic lesion in the stomach treated with APC) and July (small ulcer at recent AVM APC) and colonoscopy in July (pan diverticulosis; 4 mm polyp removed). She had capsule endoscopy week before last (results pending).   She has "been saying" that she is done with this.  This AM, she had severe abdominal pain, dizziness, sweating real bad.  Her nurse called by the time she left the bathroom because they wanted her to have more blood work; the nurse instructed her to come back to the ER.  She has regular BMs.  Her stools are "jet black."  +nausea, no vomiting.  She has been transfused once in the past.  No fevers.  She is on Eliquis for afib.   ED Course:  Chronic GI bleeding, worse this week.  Now lightheaded and dizzy.  Baseline Hgb 9.5, now 8.  Heme positive.  GI consulted and will see her.  Review of Systems: As per HPI; otherwise review of systems reviewed and negative.   Ambulatory Status:  Ambulates without assistance  Past Medical History:  Diagnosis Date  . A-fib ([place])    on Eliquis  . Anemia   . Arthritis   . Asthma    reports mild asthma since childhood - had COPD on dx list from prior PCP  . Chronic systolic congestive heart failure ([place])   . DEPRESSION  12/17/2009   Annotation: PHQ-9 score = 14 done on 12/17/2009 Qualifier: Diagnosis of  By: Hassell Done FNP, Tori Milks    . Diverticulosis   . FIBROIDS, UTERUS 03/05/2008  . GI bleed   . Glaucoma   . HEPATITIS C - s/p treatment with Harvoni, seeing hepatology, Dawn Drazek 01/04/2007  . Hyperlipemia 12/06/2012  . HYPERTENSION, BENIGN 04/24/2007  . Hypothyroidism   . LBBB (left bundle branch block)   . Nonischemic cardiomyopathy ([place])   . OBESITY 05/27/2009  . OBSTRUCTIVE SLEEP APNEA 11/14/2007   no CPAP  . OSTEOPENIA 09/30/2008  . Personal history of other infectious and parasitic disease    Hepatitis B    Past Surgical History:  Procedure Laterality Date  . appendectomy    . CARDIAC CATHETERIZATION    . CARDIOVERSION N/A 12/14/2014   Procedure: CARDIOVERSION;  Surgeon: Dorothy Spark, MD;  Location: [facility] ENDOSCOPY;  Service: Cardiovascular;  Laterality: N/A;  . CARDIOVERSION N/A 02/26/2017   Procedure: CARDIOVERSION;  Surgeon: Evans Lance, MD;  Location: [place] CV LAB;  Service: Cardiovascular;  Laterality: N/A;  . CARDIOVERSION N/A 07/24/2017   Procedure: CARDIOVERSION;  Surgeon: Thayer Headings, MD;  Location: [facility] ENDOSCOPY;  Service: Cardiovascular;  Laterality: N/A;  . COLONOSCOPY N/A 11/08/2017   Procedure: COLONOSCOPY;  Surgeon: Milus Banister, MD;  Location: [facility] ENDOSCOPY;  Service: Endoscopy;  Laterality: N/A;  . EP IMPLANTABLE DEVICE N/A 10/06/2015  Procedure: BIV ICD Generator Changeout;  Surgeon: [name], MD;  Location: [place] CV LAB;  Service: Cardiovascular;  Laterality: N/A;  . ESOPHAGOGASTRODUODENOSCOPY N/A 10/26/2017   Procedure: ESOPHAGOGASTRODUODENOSCOPY (EGD);  Surgeon: Ladene Artist, MD;  Location: [facility] ENDOSCOPY;  Service: Endoscopy;  Laterality: N/A;  . ESOPHAGOGASTRODUODENOSCOPY (EGD) WITH PROPOFOL N/A 11/07/2017   Procedure: ESOPHAGOGASTRODUODENOSCOPY (EGD) WITH PROPOFOL;  Surgeon: Milus Banister, MD;  Location: [facility] ENDOSCOPY;  Service: Endoscopy;   Laterality: N/A;  . HOT HEMOSTASIS N/A 10/26/2017   Procedure: HOT HEMOSTASIS (ARGON PLASMA COAGULATION/BICAP);  Surgeon: Ladene Artist, MD;  Location: [facility] ENDOSCOPY;  Service: Endoscopy;  Laterality: N/A;  . POLYPECTOMY  11/08/2017   Procedure: POLYPECTOMY;  Surgeon: Milus Banister, MD;  Location: [facility] ENDOSCOPY;  Service: Endoscopy;;    Social History   Socioeconomic History  . Marital status: Single    Spouse name: Not on file  . Number of children: Not on file  . Years of education: Not on file  . Highest education level: Not on file  Occupational History  . Not on file  Social Needs  . Financial resource strain: Not on file  . Food insecurity:    Worry: Not on file    Inability: Not on file  . Transportation needs:    Medical: Not on file    Non-medical: Not on file  Tobacco Use  . Smoking status: Never Smoker  . Smokeless tobacco: Never Used  Substance and Sexual Activity  . Alcohol use: Yes    Comment: occasional  . Drug use: No  . Sexual activity: Not on file  Lifestyle  . Physical activity:    Days per week: Not on file    Minutes per session: Not on file  . Stress: Not on file  Relationships  . Social connections:    Talks on phone: Not on file    Gets together: Not on file    Attends religious service: Not on file    Active member of club or organization: Not on file    Attends meetings of clubs or organizations: Not on file    Relationship status: Not on file  . Intimate partner violence:    Fear of current or ex partner: Not on file    Emotionally abused: Not on file    Physically abused: Not on file    Forced sexual activity: Not on file  Other Topics Concern  . Not on file  Social History Narrative   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      Home Situation: lives alone      Spiritual Beliefs: [place]      Lifestyle: no regular exercise; poor diet             No Known Allergies  Family History  Problem Relation Age of  Onset  . Asthma Father   . Heart attack Father   . Asthma Sister   . Lung cancer Sister   . Heart attack Mother   . Stroke Brother   . Colon cancer Neg Hx   . Esophageal cancer Neg Hx   . Pancreatic cancer Neg Hx   . Stomach cancer Neg Hx   . Liver disease Neg Hx     Prior to Admission medications   Medication Sig Start Date End Date Taking? Authorizing Provider  albuterol (PROVENTIL HFA;VENTOLIN HFA) 108 (90 BASE) MCG/ACT inhaler Inhale 2 puffs into the lungs every 6 (six) hours as needed for wheezing or  shortness of breath. 01/01/15   Morgan Kern, DO  amiodarone (PACERONE) 200 MG tablet Take 200 mg by mouth at bedtime.     [provider]  BIDIL 20-37.5 MG tablet TAKE 1 TABLET BY MOUTH TWICE DAILY 04/26/17   [name], MD  carvedilol (COREG) 25 MG tablet Take 25 mg by mouth 2 (two) times daily. 12/17/17   [provider]  ELIQUIS 5 MG TABS tablet TAKE ONE TABLET BY MOUTH TWICE DAILY 07/11/17   [name], MD  furosemide (LASIX) 40 MG tablet Take 1 tablet (40 mg total) by mouth daily. 01/01/18   [name], MD  Hydrocortisone (GERHARDT'S BUTT CREAM) CREA Apply 1 application topically 2 (two) times daily. 08/17/17   Hosie Poisson, MD  levothyroxine (SYNTHROID, LEVOTHROID) 125 MCG tablet Take 125 mcg by mouth daily before breakfast.    [provider]  metolazone (ZAROXOLYN) 2.5 MG tablet Take 1 tablet (2.5 mg total) by mouth once a week. FOR SWELLING OF WEIGHT GAIN 12/24/17 03/24/18  Baldwin Jamaica, PA-C  nitroGLYCERIN (NITROSTAT) 0.4 MG SL tablet Place 0.4 mg under the tongue every 5 (five) minutes as needed for chest pain.    [provider]  pantoprazole (PROTONIX) 40 MG tablet Take 1 tablet (40 mg total) by mouth daily. 10/26/17   Georgette Shell, MD  sertraline (ZOLOFT) 50 MG tablet Take 1 tablet (50 mg total) by mouth daily. Patient taking differently: Take 50 mg by mouth at bedtime.  06/26/17   Morgan Kern, DO    Tetrahydrozoline HCl (VISINE OP) Place 1 drop into both eyes daily as needed (DRY EYES).    [provider]  tiZANidine (ZANAFLEX) 2 MG tablet Take 1 tablet (2 mg total) by mouth at bedtime as needed for muscle spasms. 10/09/17   Morgan Kern, DO  traMADol (ULTRAM) 50 MG tablet Take 50 mg by mouth at bedtime as needed for severe pain.     [provider]    Physical Exam: Vitals:   01/03/18 1630 01/03/18 1645 01/03/18 1701 01/03/18 1720  BP: (!) 175/57 (!) 186/63 (!) 190/62 (!) 158/74  Pulse: 60 60  60  Resp: 18 13  20   Temp:    97.9 F (36.6 C)  TempSrc:    Oral  SpO2: 100% 100%       General:  Appears calm and comfortable and is NAD Eyes:  PERRL, EOMI, normal lids, iris ENT:  grossly normal hearing, lips & tongue, mmm Neck:  no LAD, masses or thyromegaly; no carotid bruits Cardiovascular:  RRR, no m/r/g. No LE edema.  Respiratory:   CTA bilaterally with no wheezes/rales/rhonchi.  Normal respiratory effort. Abdomen:  soft, diffusely tender but significant TTP in RLQ, ND, NABS Skin:  no rash or induration seen on limited exam Musculoskeletal:  grossly normal tone BUE/BLE, good ROM, no bony abnormality Lower extremity:  No LE edema.  Limited foot exam with no ulcerations.  2+ distal pulses. Psychiatric:  grossly normal mood and affect, speech fluent and appropriate, AOx3 Neurologic:  CN 2-12 grossly intact, moves all extremities in coordinated fashion, sensation intact    Radiological Exams on Admission: No results found.  EKG: none   Labs on Admission: I have personally reviewed the available labs and imaging studies at the time of the admission.  Pertinent labs:   CO2 19 Glucose 112 BUN 96/Creatinine 3.16/GFR 16; prior 99/3.48/15 on 8/26; 52/2.84/21 on 8/7 WBC 9.0 Hgb 8.0; 8.5 on 8/7; 9.3 on  7/23; 9.9 on 7/19 INR 1.52 Heme positive  Assessment/Plan Principal Problem:   GI bleeding Active Problems:   Hypothyroidism   OBSTRUCTIVE SLEEP APNEA    Chronic diastolic CHF (congestive heart failure) (HCC)   Type 2 diabetes, controlled, with renal manifestation (H[facility])   Chronic kidney disease - followed by [facility]   Hypertension associated with diabetes ([place])   Anemia   Atrial fibrillation, chronic (H[facility])   GI bleeding -Patient with persistent GI bleeding -Evaluation to date has not demonstrated clear cause and has not been effective at stopping the bleeding -Her Hgb is continuing to downtrend; stools are still heme positive; and her BUN is steadily increasing (although her creatinine is also worsening) -Her pain is in the RLQ - which would indicate a more likely lower bleeding source; however her rising BUN and black stools indicate a more likely upper or small bowel source -GI has been consulted  -The big issue that has not really been addressed is ongoing use of anticoagulation given a persistent GI bleed; while her CHA2DS2-VASc score is high, consideration may need to be given to stopping AC if the bleeding itself cannot be stopped - will admit to tele bed - Start IV pantoprazole 40 mg bid - Zofran IV for nausea - Avoid NSAIDs and SQ heparin - Maintain IV access (2 large bore IVs if possible). - Monitor closely and follow q12h cbc, transfuse as necessary. -Pain control with fentanyl for now given her compromised renal function  CKD -Baseline creatinine appeared to be 2.1 as late as early July -With her ongoing bleeding issues, her creatinine is slowly increasing -May need renal consultation as inpatient vs. Outpatient if she does not improve with hydration -Hold diuretics  Anemia -Baseline Hgb appears to be 9-10 -Hgb recently downtrending into 8 range -This is likely due to a combination of worsening renal failure as well as chronic blood loss -Her anemia does not appear to be acutely worsening and so I am not overly suspicious that the chronic bleeding is worsening but rather she may continue to downtrend to the point  that she needs a transfusion to avoid becoming more symptomatic.  Chronic afib, on Eliquis -Her CHA2DS2-VASc score is 5, indicating a 6.7%/year stroke risk -While this stroke risk is quite high and warrants anticoagulation, her HAS-BLED score is higher at baseline (4, with an 8.7%/year bleeding risk) -When considering this ongoing GI bleed, the risk of AC is likely greater than the benefit until the bleeding stabilizes -Will hold Eliquis for now and defer to day team -She is on Amiodarone for rate control; this was held due to "liver/kidney dysfunction" but it is not renally excreted and pharmacy approves continuation so it was restarted  Hypothyroidism -Normal TSH in 7/19 -Continue Synthroid at current dose for now  HTN -Continue Coreg  DM -A1c was 6.8 in 1/19 -Apparently reasonably diet controlled -Cover with moderate-scale SSI   Chronic CHF -Continue Bidil, Coreg -Hold diuretics, use IVF judiciously    DVT prophylaxis: SCDs Code Status: Full - confirmed with patient Family Communication: None present Disposition Plan:  Home once clinically improved Consults called: GI  Admission status: Admit - It is my clinical opinion that admission to [place] is reasonable and necessary because of the expectation that this patient will require hospital care that crosses at least 2 midnights to treat this condition based on the medical complexity of the problems presented.  Given the aforementioned information, the predictability of an adverse outcome is felt to be significant.  Karmen Bongo MD [facility]  If note is complete, please contact covering daytime or nighttime physician. [email or web address] Password [facility]  01/03/2018, 5:36 PM

## 2018-01-03 NOTE — ED Notes (Signed)
Attempted report 

## 2018-01-03 NOTE — Consult Note (Addendum)
[facility] Consult: 3:02 PM 01/03/2018  LOS: 0 days    Referring Provider: Dr Marijean Bravo  Primary Care Physician:  [name], DO Primary Gastroenterologist:  [name]   Reason for Consultation:  Weak, presyncopal, dark stool.     HPI: Morgan Clay is a 72 y.o. female.  PMH CKD stage 4-5.  Chronic Eliquis for A fib. DCCV 07/2017.  HTN.  Non-ischemic CM.  EF 60%.  Diverticulitis and SBO in 07/2017. HCV treated with Harvoni per [place] [facility].  Hepatitis B core antibody +04/2008. CT ab/pelvis without contrast 08/03/17:  Interval development of moderate small bowel dilatation concerning for ileus or distal small bowel obstruction.  Proximal sigmoid diverticulitis is again noted. Mildly nodular hepatic margins are noted suggesting hepatic cirrhosis. Large calcified uterine fibroid is noted.  GIB (melena) and anemia in 10/2017.  Received 2 U PRBCs.  Hgb 7.1 >> 10.1.   10/26/2017 EGD.  4 mm AVM at greater curvature, treated with APC.  Mild intrinsic stenosis at GEJx  Readmitted with recurrent melena 11/2017.  Hgb 8.8 >> 9.4, not transfused.   11/07/17 EGD.  Non-bleeding small ulcer in antrum at site of recent APC 11/08/2017 Colonoscopy.  4 mm polyp in transverse colon.  Pathology benign colonic mucosa.    Hgb July/23 thru 8/7 was 9.3 > 8.5.    ROV with GI 8/7: still having black stools for 3 weeks and was not on Iron, bismuth. + weakness, fatigue.  Capsule endoscopy was completed last week, she sent the equipment back to the company.  The report has not been forwarded back to [name] yet. She has had a couple of parenteral iron infusions.  Last took place on 8/14.  Patient has not been started on any erythropoietin analogs.  She has been seen in July by Dr. Alen Blew  hematology.  On 8/15 because of remedy edema  Dr. Caryl Comes advised her to stop her Lasix dose for the next 3 days and to take metolazone for 2 days The stools are constantly black, she does not see blood in them were maroon-colored stool.  Sometimes she has lower abdominal discomfort.  Generally her appetite is okay.  For the past few days she is gotten receiving a week and dyspneic.  Yesterday she started experiencing presyncope and some nausea with this.  She came back to the ED today because of the symptoms.   Hgb currently 8.5   FOBT + Platelets normal.  PT/INR 18.2/1.5.   BUN 96.  Baseline is 40s - 50s but up to 72 on 8/19.      Past Medical History:  Diagnosis Date  . Anemia   . Arthritis   . Asthma    reports mild asthma since childhood - had COPD on dx list from prior PCP  . Atypical atrial flutter ([place]) 09/28/2015  . Chronic systolic congestive heart failure ([place])   . DEPRESSION 12/17/2009   Annotation: PHQ-9 score = 14 done on 12/17/2009 Qualifier: Diagnosis of  By: Hassell Done FNP, Tori Milks    . Diverticulosis   . FIBROIDS, UTERUS  03/05/2008  . GI bleed   . Glaucoma   . HEPATITIS C - s/p treatment with Harvoni, seeing hepatology, Dawn Drazek 01/04/2007  . Hyperlipemia 12/06/2012  . HYPERTENSION, BENIGN 04/24/2007  . Hypothyroidism   . LBBB (left bundle branch block)   . Nonischemic cardiomyopathy ([place])   . OBESITY 05/27/2009  . OBSTRUCTIVE SLEEP APNEA 11/14/2007   npsg 2009:  Mild osa with AHI 11/hr. Started cpap 2009, quit using due to lack of response Home sleep testing 09/2010 with AHI only 7/hr and weight loss advised by pulmonology.  . OSTEOPENIA 09/30/2008  . Personal history of other infectious and parasitic disease    Hepatitis B    Past Surgical History:  Procedure Laterality Date  . appendectomy    . CARDIAC CATHETERIZATION    . CARDIOVERSION N/A 12/14/2014   Procedure: CARDIOVERSION;  Surgeon: Dorothy Spark, MD;  Location: [facility] ENDOSCOPY;  Service: Cardiovascular;  Laterality: N/A;  . CARDIOVERSION N/A 02/26/2017     Procedure: CARDIOVERSION;  Surgeon: Evans Lance, MD;  Location: [place] [facility] CV LAB;  Service: Cardiovascular;  Laterality: N/A;  . CARDIOVERSION N/A 07/24/2017   Procedure: CARDIOVERSION;  Surgeon: Thayer Headings, MD;  Location: [facility] ENDOSCOPY;  Service: Cardiovascular;  Laterality: N/A;  . COLONOSCOPY N/A 11/08/2017   Procedure: COLONOSCOPY;  Surgeon: Milus Banister, MD;  Location: [facility] ENDOSCOPY;  Service: Endoscopy;  Laterality: N/A;  . EP IMPLANTABLE DEVICE N/A 10/06/2015   Procedure: BIV ICD Generator Changeout;  Surgeon: [name], MD;  Location: [place] CV LAB;  Service: Cardiovascular;  Laterality: N/A;  . ESOPHAGOGASTRODUODENOSCOPY N/A 10/26/2017   Procedure: ESOPHAGOGASTRODUODENOSCOPY (EGD);  Surgeon: Ladene Artist, MD;  Location: [facility] ENDOSCOPY;  Service: Endoscopy;  Laterality: N/A;  . ESOPHAGOGASTRODUODENOSCOPY (EGD) WITH PROPOFOL N/A 11/07/2017   Procedure: ESOPHAGOGASTRODUODENOSCOPY (EGD) WITH PROPOFOL;  Surgeon: Milus Banister, MD;  Location: [facility] ENDOSCOPY;  Service: Endoscopy;  Laterality: N/A;  . HOT HEMOSTASIS N/A 10/26/2017   Procedure: HOT HEMOSTASIS (ARGON PLASMA COAGULATION/BICAP);  Surgeon: Ladene Artist, MD;  Location: [facility] ENDOSCOPY;  Service: Endoscopy;  Laterality: N/A;  . POLYPECTOMY  11/08/2017   Procedure: POLYPECTOMY;  Surgeon: Milus Banister, MD;  Location: [facility] ENDOSCOPY;  Service: Endoscopy;;    Prior to Admission medications   Medication Sig Start Date End Date Taking? Authorizing Provider  albuterol (PROVENTIL HFA;VENTOLIN HFA) 108 (90 BASE) MCG/ACT inhaler Inhale 2 puffs into the lungs every 6 (six) hours as needed for wheezing or shortness of breath. 01/01/15   [name], DO  amiodarone (PACERONE) 200 MG tablet Take 200 mg by mouth at bedtime.     [provider]  BIDIL 20-37.5 MG tablet TAKE 1 TABLET BY MOUTH TWICE DAILY 04/26/17   [name], MD  carvedilol (COREG) 25 MG tablet Take 25 mg by mouth 2 (two) times daily. 12/17/17    [provider]  ELIQUIS 5 MG TABS tablet TAKE ONE TABLET BY MOUTH TWICE DAILY 07/11/17   [name], MD  furosemide (LASIX) 40 MG tablet Take 1 tablet (40 mg total) by mouth daily. 01/01/18   [name], MD  Hydrocortisone (GERHARDT'S BUTT CREAM) CREA Apply 1 application topically 2 (two) times daily. 08/17/17   Hosie Poisson, MD  levothyroxine (SYNTHROID, LEVOTHROID) 125 MCG tablet Take 125 mcg by mouth daily before breakfast.    [provider]  metolazone (ZAROXOLYN) 2.5 MG tablet Take 1 tablet (2.5 mg total) by mouth once a week. FOR SWELLING OF WEIGHT GAIN 12/24/17  03/24/18  Baldwin Jamaica, PA-C  nitroGLYCERIN (NITROSTAT) 0.4 MG SL tablet Place 0.4 mg under the tongue every 5 (five) minutes as needed for chest pain.    [provider]  pantoprazole (PROTONIX) 40 MG tablet Take 1 tablet (40 mg total) by mouth daily. 10/26/17   Georgette Shell, MD  sertraline (ZOLOFT) 50 MG tablet Take 1 tablet (50 mg total) by mouth daily. Patient taking differently: Take 50 mg by mouth at bedtime.  06/26/17   [name], DO  Tetrahydrozoline HCl (VISINE OP) Place 1 drop into both eyes daily as needed (DRY EYES).    [provider]  tiZANidine (ZANAFLEX) 2 MG tablet Take 1 tablet (2 mg total) by mouth at bedtime as needed for muscle spasms. 10/09/17   [name], DO  traMADol (ULTRAM) 50 MG tablet Take 50 mg by mouth at bedtime as needed for severe pain.     [provider]    Scheduled Meds: . pantoprazole  40 mg Intravenous Once   Infusions:  PRN Meds:    Allergies as of 01/03/2018  . (No Known Allergies)    Family History  Problem Relation Age of Onset  . Asthma Father   . Heart attack Father   . Asthma Sister   . Lung cancer Sister   . Heart attack Mother   . Stroke Brother   . Colon cancer Neg Hx   . Esophageal cancer Neg Hx   . Pancreatic cancer Neg Hx   . Stomach cancer Neg Hx   . Liver disease Neg Hx     Social History    Socioeconomic History  . Marital status: Single    Spouse name: Not on file  . Number of children: Not on file  . Years of education: Not on file  . Highest education level: Not on file  Occupational History  . Not on file  Social Needs  . Financial resource strain: Not on file  . Food insecurity:    Worry: Not on file    Inability: Not on file  . Transportation needs:    Medical: Not on file    Non-medical: Not on file  Tobacco Use  . Smoking status: Never Smoker  . Smokeless tobacco: Never Used  Substance and Sexual Activity  . Alcohol use: Yes    Comment: occasional  . Drug use: No  . Sexual activity: Not on file  Lifestyle  . Physical activity:    Days per week: Not on file    Minutes per session: Not on file  . Stress: Not on file  Relationships  . Social connections:    Talks on phone: Not on file    Gets together: Not on file    Attends religious service: Not on file    Active member of club or organization: Not on file    Attends meetings of clubs or organizations: Not on file    Relationship status: Not on file  . Intimate partner violence:    Fear of current or ex partner: Not on file    Emotionally abused: Not on file    Physically abused: Not on file    Forced sexual activity: Not on file  Other Topics Concern  . Not on file  Social History Narrative   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      Home Situation: lives alone      Spiritual Beliefs: [place]      Lifestyle:  no regular exercise; poor diet             REVIEW OF SYSTEMS: Constitutional:  Per HPI ENT:  No nose bleeds Pulm: Short of breath.  No significant cough. CV:  No palpitations, no sense that her heart is rhythm.  Edema is worse in her left leg, intermittent..  GU:  No hematuria, no frequency GI: Dysphagia.  No bleeding per rectum Heme: Denies excessive bruising or bleeding Transfusions:  Per HPI.   Neuro:  No headaches, no peripheral tingling or  numbness Derm:  No itching, no rash or sores.  Endocrine:  No sweats or chills.  No polyuria or dysuria Immunization:  Reviewed.   Travel:  None beyond local counties in last few months.    PHYSICAL EXAM: Vital signs in last 24 hours: Vitals:   01/03/18 1400 01/03/18 1430  BP: (!) 147/46 (!) 148/48  Pulse: (!) 59 60  Resp: (!) 26 16  Temp:    SpO2: 100% 100%   Wt Readings from Last 3 Encounters:  12/24/17 64.9 kg  12/12/17 67.1 kg  11/27/17 65.5 kg    General: Pleasant, tired and mildly ill looking who is alert and comfortable Head: No facial asymmetry or swelling.  No signs of head trauma. Eyes: No scleral icterus.  Conjunctiva is pale. EOMI Ears:  Not HOH  Nose:  No cngestion or discharge Mouth:  Moist, and clear, pink oral MM.  Tongue midline. Neck: No JVD, no masses, megaly. Lungs: Clear bilaterally.  No labored breathing or cough. Heart: RRR.  NS in the 60s on telemetry. Abdomen: Soft.  Not tender or distended.  Active bowel sounds.  No HSM, masses, bruits, hernias..   Rectal: Deferred Musc/Skeltl: No joint redness or swelling or significant deformities. Extremities: Minor, nonpitting edema feet. Neurologic: Alert.  X3Kermit Balo historian.  No tremors or weakness. Skin: No rashes, no sores, no suspicious lesions. Tattoos: Profession grade tattoos on upper  chest.   Psych:  Pleasant, cooperative, calm.     Intake/Output from previous day: No intake/output data recorded. Intake/Output this shift: No intake/output data recorded.  LAB RESULTS: Recent Labs    01/03/18 1100  WBC 9.0  HGB 8.0*  HCT 25.9*  PLT 239   BMET Lab Results  Component Value Date   NA 138 01/03/2018   NA 138 12/31/2017   NA 136 12/24/2017   K 4.5 01/03/2018   K 4.8 12/31/2017   K 4.0 12/24/2017   CL 106 01/03/2018   CL 98 12/31/2017   CL 89 (L) 12/24/2017   CO2 19 (L) 01/03/2018   CO2 22 12/31/2017   CO2 24 12/24/2017   GLUCOSE 112 (H) 01/03/2018   GLUCOSE 109 (H) 12/31/2017    GLUCOSE 123 (H) 12/24/2017   BUN 96 (H) 01/03/2018   BUN 99 (HH) 12/31/2017   BUN 72 (H) 12/24/2017   CREATININE 3.16 (H) 01/03/2018   CREATININE 3.48 (H) 12/31/2017   CREATININE 3.36 (H) 12/24/2017   CALCIUM 9.6 01/03/2018   CALCIUM 10.1 12/31/2017   CALCIUM 9.7 12/24/2017   LFT Recent Labs    01/03/18 1100  PROT 8.6*  ALBUMIN 4.4  AST 27  ALT 18  ALKPHOS 55  BILITOT 0.7   PT/INR Lab Results  Component Value Date   INR 1.52 01/03/2018   INR 1.34 10/25/2017   INR 1.37 07/31/2017   Hepatitis Panel No results for input(s): HEPBSAG, HCVAB, HEPAIGM, HEPBIGM in the last 72 hours. C-Diff No components found for: CDIFF Lipase  Component Value Date/Time   LIPASE 25 07/31/2017 1537    Drugs of Abuse     Component Value Date/Time   LABOPIA NONE DETECTED 10/02/2015 0329   COCAINSCRNUR NONE DETECTED 10/02/2015 0329   LABBENZ NONE DETECTED 10/02/2015 0329   AMPHETMU NONE DETECTED 10/02/2015 0329   THCU POSITIVE (A) 10/02/2015 0329   LABBARB NONE DETECTED 10/02/2015 0329     RADIOLOGY STUDIES: No results found.    IMPRESSION:   *   GI bleed.  Suspect small bowel AVMs.  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  Benign mucosa in colon polyp in 11/2017  *    Acute on chronic anemia, symptomatic.  Received blood 10/2017.  Has completed iron and twice in the last several weeks.  *    On chronic Eliquis.  History of A. fib but successfully cardioverted in March 2019.  Currently in NSR ? Does she still need Eliquis  S/P pacemaker placement.  *     Late stage CKD.  Contributes to chronic anemia.    *     Congestive heart failure but EF is 60%.  *   Hx Hep C, treated with Harvoni ~ 2008 CT scan of 07/2017 raised question of cirrhotic appearance to the liver.  However on EGD she has had in June and July was no evidence of esophageal gastric varices or portal hypertensive gastropathy.  She is not quite.  Her platelets are mildly reduced.    PLAN:      *    Await report from recent capsule endoscopy.  *   No plans to repeat endoscopy or colonoscopy at present.  Does not need IV PPI.  She can eat, I ordered heart healthy diet.  *   Ask cardiology if she still requires Eliquis.  Stopping this medication may go a long way improving her anemia and blood loss.  *   From GI standpoint not convinced she needs transfusion, but as her Hgb is 8 today would defer the indication of pRBCs transfusion more for the cardiac benefit to the primary hospitalist service.      [name]  01/03/2018, 3:02 PM Phone [phone]   Attending physician's note   I have taken an interval history, reviewed the chart and examined the patient. I agree with the Advanced Practitioner's note, impression, and recommendations as outlined. As she has had recent endoscopic evaluation for this same presentation, including a VCE (pending forwarding of images), along with her HD stability at presentation, do not feel that this requires urgent repeat endoscopic evaluation at this time. 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 CBC and iron indices trend since March more indicative of slow bleed and she was appropriately treated with iron infusion earlier this month. In the meantime, agree that she may benefit from pRBCs more from a cardioprotective/symptomatic standpoint rather than 2/2 suspicion for brisk bleed require resuscitation.    [address] DO, FACG [phone] office

## 2018-01-04 DIAGNOSIS — K31811 Angiodysplasia of stomach and duodenum with bleeding: Secondary | ICD-10-CM

## 2018-01-04 DIAGNOSIS — I5032 Chronic diastolic (congestive) heart failure: Secondary | ICD-10-CM

## 2018-01-04 LAB — BASIC METABOLIC PANEL
Anion gap: 7 (ref 5–15)
BUN: 88 mg/dL — ABNORMAL HIGH (ref 8–23)
CO2: 24 mmol/L (ref 22–32)
Calcium: 9.1 mg/dL (ref 8.9–10.3)
Chloride: 107 mmol/L (ref 98–111)
Creatinine, Ser: 2.84 mg/dL — ABNORMAL HIGH (ref 0.44–1.00)
GFR calc non Af Amer: 16 mL/min — ABNORMAL LOW (ref >60)
GFR, EST AFRICAN AMERICAN: 18 mL/min — AB (ref >60)
Glucose, Bld: 105 mg/dL — ABNORMAL HIGH (ref 70–99)
POTASSIUM: 3.9 mmol/L (ref 3.5–5.1)
SODIUM: 138 mmol/L (ref 135–145)

## 2018-01-04 LAB — HEMOGLOBIN AND HEMATOCRIT, BLOOD
HCT: 28.9 % — ABNORMAL LOW (ref 36.0–46.0)
Hemoglobin: 9 g/dL — ABNORMAL LOW (ref 12.0–15.0)

## 2018-01-04 LAB — GLUCOSE, CAPILLARY
GLUCOSE-CAPILLARY: 136 mg/dL — AB (ref 70–99)
Glucose-Capillary: 114 mg/dL — ABNORMAL HIGH (ref 70–99)
Glucose-Capillary: 114 mg/dL — ABNORMAL HIGH (ref 70–99)

## 2018-01-04 LAB — CBC
HEMATOCRIT: 23.7 % — AB (ref 36.0–46.0)
HEMOGLOBIN: 7.3 g/dL — AB (ref 12.0–15.0)
MCH: 28.5 pg (ref 26.0–34.0)
MCHC: 30.8 g/dL (ref 30.0–36.0)
MCV: 92.6 fL (ref 78.0–100.0)
Platelets: 211 10*3/uL (ref 150–400)
RBC: 2.56 MIL/uL — AB (ref 3.87–5.11)
RDW: 16.4 % — ABNORMAL HIGH (ref 11.5–15.5)
WBC: 8 10*3/uL (ref 4.0–10.5)

## 2018-01-04 LAB — PREPARE RBC (CROSSMATCH)

## 2018-01-04 MED ORDER — POLYETHYLENE GLYCOL 3350 17 G PO PACK
17.0000 g | PACK | Freq: Two times a day (BID) | ORAL | Status: DC
  Filled 2018-01-04: qty 1

## 2018-01-04 MED ORDER — SODIUM CHLORIDE 0.9% IV SOLUTION: Freq: Once | INTRAVENOUS | Status: DC

## 2018-01-04 MED ORDER — FUROSEMIDE 10 MG/ML IJ SOLN
20.0000 mg | Freq: Once | INTRAMUSCULAR | Status: AC
  Administered 2018-01-04: 20 mg via INTRAVENOUS
  Filled 2018-01-04: qty 2

## 2018-01-04 MED ORDER — BISACODYL 10 MG RE SUPP
10.0000 mg | Freq: Once | RECTAL | Status: DC
  Filled 2018-01-04: qty 1

## 2018-01-04 NOTE — Progress Notes (Addendum)
Patient Demographics:    Morgan Clay, is a 72 y.o. female, DOB - 15-Jun-1945, KVQ:[id number]  Admit date - 01/03/2018   Admitting Physician Morgan Bongo, MD  Outpatient Primary MD for the patient is Morgan Kern, DO  LOS - 1   Chief Complaint  Patient presents with  . Abdominal Pain  . Dark Tarry Stool        Subjective:    Morgan Clay today has no fevers, no emesis,  No chest pain, dizziness, fatigue, and palpitations with minimal activity , no further BM  Assessment  & Plan :    Principal Problem:   GI bleeding Active Problems:   Hypothyroidism   OBSTRUCTIVE SLEEP APNEA   Chronic diastolic CHF (congestive heart failure) (HCC)   Type 2 diabetes, controlled, with renal manifestation (HCC)   Chronic kidney disease - followed by [facility]   Hypertension associated with diabetes ([place])   Anemia   Atrial fibrillation, chronic ([place])  Brief?Summary 72 y.o. female with medical history significant of OSA; NICM with chronic systolic CHF; hypothyroidism; HTN; HLD; Hep C s/p treatment with Harvoni; and atrial flutter/Atrial fib?on Eliquis for anticoagulation admitted on 01/03/2018 with black stools, dizziness and syncope and hemoglobin dropping  Plan:- 1)Symptomatic anemia--pt with acute on chronic anemia secondary to ongoing GI loss in the setting of chronic anticoagulation, patient hemoglobin usually above 9, suspect some degree of anemia of CKD, on admission hemoglobin was 8.0, today hemoglobin is down to 7.3, transfuse 1 unit of packed red blood cells with Lasix,?Risk, benefits and alternatives to transfusion of blood products discussed. Indication for transfusion discussed. Consent obtained? .? 2)AKI----acute kidney injury on CKD stage -?IV ,?suspect due to GI bleed with prerenal azotemia,????creatinine on admission= ?3.1?, ??baseline creatinine = 2??, creatinine is now= 2.84 (recent  peak 3.48)??, renally adjust medications, avoid nephrotoxic agents/dehydration/hypotension  3)Recurrent GI bleed---?leading to anemia as above #1,?GI consult appreciated, patient had an EGD in June 2019 with benign esophageal stenosis and single nonbleeding AVM treated with APC, repeat endoscopy in July 2019 showed a small ulcer at the recent AVM treated with APC again, colonoscopy in July 2019 with pandiverticulosis, 4 mm polyp removed, patient had capsule endoscopy this?August, results are pending, GI recommends p.o. Protonix  4)?chronic atrial fibrillation/atrial flutter- pt is status-post DCCV, appears to be maintaining sinus rhythm, last known EF is60 to 65%, patient status post pacemaker placement,?CHA2DS2-vascular score is 5, however?HASBLED?score is 4, risk Versus benefits of ongoing anticoagulation discussed with patient,?patient may have to go off Eliquis given recurrent bleeding requiring transfusion,?patient wants to talk to her daughter prior to making a final decision.??Eliquis is on hold for now,?continue amiodarone and Coreg for rate control  5)DM2-stable, A1c 6.8 recently,?Use Novolog/Humalog Sliding scale insulin with Accu-Cheks/Fingersticks as ordered ? 6)HTN/HFpEF---?stable, last known EF 60 to 65%, no acute exacerbation , continue BiDil and Coreg  7)Hypothyroidism-recent TSH was normal, continue Synthroid   Disposition/Need for in-Hospital Stay- patient unable to be discharged at this time due to AKI on CKD AND symptomatic anemia with dizziness and near syncope requiring transfusion, hemoglobin drifting down to 7.3,  patient remains symptomatic, we will transfuse 1 unit of packed cells and monitor H&H    Code Status : Full   Disposition Plan  : home  Consults  :  Gi   DVT Prophylaxis  :  - SCDs   Lab Results  Component Value Date   PLT 211 01/04/2018    Inpatient Medications  Scheduled Meds: . sodium chloride   Intravenous Once  . amiodarone  200 mg Oral QHS    . bisacodyl  10 mg Rectal Once  . carvedilol  25 mg Oral BID WC  . furosemide  20 mg Intravenous Once  . insulin aspart  0-15 Units Subcutaneous TID WC  . isosorbide-hydrALAZINE  1 tablet Oral BID  . levothyroxine  125 mcg Oral QAC breakfast  . pantoprazole  40 mg Oral Q0600  . polyethylene glycol  17 g Oral BID  . sertraline  50 mg Oral Daily   Continuous Infusions: . lactated ringers 75 mL/hr at 01/04/18 0633   PRN Meds:.acetaminophen **OR** acetaminophen, morphine injection, ondansetron **OR** ondansetron (ZOFRAN) IV    Anti-infectives (From admission, onward)   None        Objective:   Vitals:   01/03/18 2134 01/04/18 0405 01/04/18 0507 01/04/18 0638  BP: (!) 97/46  (!) 104/42 (!) 104/42  Pulse: 60  (!) 59 (!) 59  Resp: 18  18 18   Temp: 98 F (36.7 C) 98.2 F (36.8 C) 98 F (36.7 C) 98 F (36.7 C)  TempSrc:    Oral  SpO2: 100%  100%   Height:    4\' 11"  (1.499 m)    Wt Readings from Last 3 Encounters:  12/24/17 64.9 kg  12/12/17 67.1 kg  11/27/17 65.5 kg     Intake/Output Summary (Last 24 hours) at 01/04/2018 1145 Last data filed at 01/04/2018 1001 Gross per 24 hour  Intake 1100 ml  Output -  Net 1100 ml     Physical Exam  Gen:- Awake Alert,  In no apparent distress  HEENT:- [place].AT, No sclera icterus Neck-Supple Neck,No JVD,.  Lungs-  CTAB , no wheezing CV- S1, S2 normal, pacemaker in situ Abd-  +ve B.Sounds, Abd Soft, No tenderness,    Extremity/Skin:- No  edema, good pulses Psych-affect is appropriate, oriented x3 Neuro-no new focal deficits, no tremors   Data Review:   Micro Results No results found for this or any previous visit (from the past 240 hour(s)).  Radiology Reports No results found.   CBC Recent Labs  Lab 01/03/18 1100 01/03/18 2146 01/04/18 0727  WBC 9.0 8.0 8.0  HGB 8.0* 7.9* 7.3*  HCT 25.9* 25.2* 23.7*  PLT 239 215 211  MCV 91.8 91.0 92.6  MCH 28.4 28.5 28.5  MCHC 30.9 31.3 30.8  RDW 16.2* 16.1* 16.4*     Chemistries  Recent Labs  Lab 12/31/17 0846 01/03/18 1100 01/04/18 0456  NA 138 138 138  K 4.8 4.5 3.9  CL 98 106 107  CO2 22 19* 24  GLUCOSE 109* 112* 105*  BUN 99* 96* 88*  CREATININE 3.48* 3.16* 2.84*  CALCIUM 10.1 9.6 9.1  AST  --  27  --   ALT  --  18  --   ALKPHOS  --  55  --   BILITOT  --  0.7  --    ------------------------------------------------------------------------------------------------------------------ No results for input(s): CHOL, HDL, LDLCALC, TRIG, CHOLHDL, LDLDIRECT in the last 72 hours.  Lab Results  Component Value Date   HGBA1C 6.8 (H) 06/07/2017   ------------------------------------------------------------------------------------------------------------------ No results for input(s): TSH, T4TOTAL, T3FREE, THYROIDAB in the last 72 hours.  Invalid input(s): FREET3 ------------------------------------------------------------------------------------------------------------------ No results for input(s): VITAMINB12, FOLATE, FERRITIN, TIBC,  IRON, RETICCTPCT in the last 72 hours.  Coagulation profile Recent Labs  Lab 01/03/18 1100  INR 1.52    No results for input(s): DDIMER in the last 72 hours.  Cardiac Enzymes No results for input(s): CKMB, TROPONINI, MYOGLOBIN in the last 168 hours.  Invalid input(s): CK ------------------------------------------------------------------------------------------------------------------    Component Value Date/Time   BNP 547.9 (H) 07/31/2017 1229    Roxan Hockey M.D on 01/04/2018 at 11:45 AM  Pager---[phone] Go to [email or web address] - password [email or web address] for contact info  [facility] - Office  [phone]

## 2018-01-04 NOTE — Progress Notes (Addendum)
Daily Rounding Note  01/04/2018, 12:31 PM  LOS: 1 day   SUBJECTIVE:   Chief complaint:     Patient still feels weak, lightheaded.  No abdominal pain or nausea.  OBJECTIVE:         Vital signs in last 24 hours:    Temp:  [97.9 F (36.6 C)-98.2 F (36.8 C)] 98 F (36.7 C) (08/30 8099) Pulse Rate:  [59-60] 59 (08/30 0638) Resp:  [13-26] 18 (08/30 8338) BP: (97-190)/(42-74) 104/42 (08/30 2505) SpO2:  [97 %-100 %] 100 % (08/30 0507) Last BM Date: 01/03/18 There were no vitals filed for this visit. General: Looks a bit tired but overall well. Heart: RRR. Chest: Clear bilaterally.  No labored breathing. Abdomen: Soft.  Not distended.  Minimal tenderness on the left side.  No guarding or rebound.  Active bowel sounds. Extremities: No CCE. Neuro/Psych: Oriented x3, calm, pleasant.  No confusion.  Intake/Output from previous day: 08/29 0701 - 08/30 0700 In: 900 [I.V.:900] Out: -   Intake/Output this shift: Total I/O In: 200 [P.O.:200] Out: -   Lab Results: Recent Labs    01/03/18 1100 01/03/18 2146 01/04/18 0727  WBC 9.0 8.0 8.0  HGB 8.0* 7.9* 7.3*  HCT 25.9* 25.2* 23.7*  PLT 239 215 211   BMET Recent Labs    01/03/18 1100 01/04/18 0456  NA 138 138  K 4.5 3.9  CL 106 107  CO2 19* 24  GLUCOSE 112* 105*  BUN 96* 88*  CREATININE 3.16* 2.84*  CALCIUM 9.6 9.1   LFT Recent Labs    01/03/18 1100  PROT 8.6*  ALBUMIN 4.4  AST 27  ALT 18  ALKPHOS 55  BILITOT 0.7   PT/INR Recent Labs    01/03/18 1100  LABPROT 18.2*  INR 1.52   ASSESMENT:   *   GI bleed.  Suspect small bowel AVMs.  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  Benign mucosa in colon polyp in 11/2017 She has not received transfusion yet,  *    Acute on chronic anemia, symptomatic.  Received blood 10/2017.  Has completed iron and twice in the last several weeks. Hgb has gone from 7.9 to 7.3 overnight  *    On chronic  Eliquis.  History of A. fib but successfully cardioverted in March 2019.  Currently in NSR ? Does she still need Eliquis  S/P pacemaker placement.  *     Late stage CKD.  Contributes to chronic anemia.    *     Congestive heart failure but EF is 60%.  *   Hx Hep C, treated with Harvoni ~ 2008 CT scan of 07/2017 raised question of cirrhotic appearance to the liver.  However on EGD she has had in June and July was no evidence of esophageal gastric varices or portal hypertensive gastropathy.  She is not quite.  Her platelets are mildly reduced.    PLAN   *    She is going to receive 1 unit of blood today.  After that she can discharge home from a GI standpoint. We do not have the results yet on the capsule endoscopy.  Any future endoscopy planned need to be based on findings of the capsule study.  I asked the patient to call the GI office by middle or late next week if she has not heard back from them.  At discharge continue the Protonix 40 mg once daily.  *   Patient  is going to discuss discontinuing Eliquis with both her daughter and hopefully with her cardiologist.   [name]  01/04/2018, 12:31 PM Phone [phone]   Attending physician's note   I have taken an interval history, reviewed the chart and examined the patient. I agree with the Advanced Practitioner's note, impression, and recommendations as outlined.  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  Results of capsule endoscopy pending and agree that this will dictate future treatment options to include endoscopic (i.e. balloon enteroscopy) versus medical management (i.e. octreotide).  Agree with transfusion today for symptomatic anemia due to slow GI bleed and query  risk benefit profile and need for ongoing systemic anticoagulation-to discuss with her cardiologist.  Will plan for  follow-up in the GI clinic.  Will follow peripherally at this time but please do not hesitate to contact the on-call with any additional questions or concerns and certainly if there is any hemodynamic change or concern for large-volume GI blood loss requiring more urgent/emergent endoscopic intervention.  [address], DO, FACG [phone] office

## 2018-01-05 LAB — GLUCOSE, CAPILLARY
GLUCOSE-CAPILLARY: 85 mg/dL (ref 70–99)
Glucose-Capillary: 125 mg/dL — ABNORMAL HIGH (ref 70–99)

## 2018-01-05 LAB — TYPE AND SCREEN
ABO/RH(D): O POS
ANTIBODY SCREEN: NEGATIVE
UNIT DIVISION: 0

## 2018-01-05 LAB — BASIC METABOLIC PANEL
ANION GAP: 9 (ref 5–15)
BUN: 70 mg/dL — ABNORMAL HIGH (ref 8–23)
CO2: 23 mmol/L (ref 22–32)
Calcium: 9 mg/dL (ref 8.9–10.3)
Chloride: 105 mmol/L (ref 98–111)
Creatinine, Ser: 2.83 mg/dL — ABNORMAL HIGH (ref 0.44–1.00)
GFR calc non Af Amer: 16 mL/min — ABNORMAL LOW (ref >60)
GFR, EST AFRICAN AMERICAN: 18 mL/min — AB (ref >60)
GLUCOSE: 97 mg/dL (ref 70–99)
POTASSIUM: 4.2 mmol/L (ref 3.5–5.1)
Sodium: 137 mmol/L (ref 135–145)

## 2018-01-05 LAB — CBC
HCT: 26.7 % — ABNORMAL LOW (ref 36.0–46.0)
HEMOGLOBIN: 8.1 g/dL — AB (ref 12.0–15.0)
MCH: 27.7 pg (ref 26.0–34.0)
MCHC: 30.3 g/dL (ref 30.0–36.0)
MCV: 91.4 fL (ref 78.0–100.0)
Platelets: 209 10*3/uL (ref 150–400)
RBC: 2.92 MIL/uL — AB (ref 3.87–5.11)
RDW: 16.9 % — ABNORMAL HIGH (ref 11.5–15.5)
WBC: 7.8 10*3/uL (ref 4.0–10.5)

## 2018-01-05 LAB — BPAM RBC
BLOOD PRODUCT EXPIRATION DATE: 201909252359
ISSUE DATE / TIME: 201908301238
Unit Type and Rh: 5100

## 2018-01-05 MED ORDER — PANTOPRAZOLE SODIUM 40 MG PO TBEC: 40.0000 mg | DELAYED_RELEASE_TABLET | Freq: Every day | ORAL | 4 refills | Status: DC

## 2018-01-05 MED ORDER — FUROSEMIDE 40 MG PO TABS: 20.0000 mg | ORAL_TABLET | Freq: Every day | ORAL | 0 refills | Status: DC

## 2018-01-05 MED ORDER — ONDANSETRON HCL 4 MG PO TABS: 4.0000 mg | ORAL_TABLET | Freq: Four times a day (QID) | ORAL | 0 refills | Status: DC | PRN

## 2018-01-05 MED ORDER — ALBUTEROL SULFATE HFA 108 (90 BASE) MCG/ACT IN AERS: 2.0000 | INHALATION_SPRAY | Freq: Four times a day (QID) | RESPIRATORY_TRACT | 3 refills | Status: DC | PRN

## 2018-01-05 MED ORDER — POLYETHYLENE GLYCOL 3350 17 G PO PACK: 17.0000 g | PACK | Freq: Two times a day (BID) | ORAL | 2 refills | Status: DC

## 2018-01-05 MED ORDER — SPIRONOLACTONE 25 MG PO TABS: 25.0000 mg | ORAL_TABLET | Freq: Every day | ORAL | 0 refills | Status: DC

## 2018-01-05 NOTE — Discharge Instructions (Signed)
1)Avoid ibuprofen/Advil/Aleve/Motrin/Goody Powders/Naproxen/BC powders/Meloxicam/Diclofenac/Indomethacin and other Nonsteroidal anti-inflammatory medications as these will make you more likely to bleed and can cause stomach ulcers, can also cause Kidney problems.  2) call your GI clinic on 01/07/2018 to get results of your capsule endoscopy test 3) hold Aldactone and reduce Lasix to 20 mg daily from 40 mg until the results of your blood test on 01/07/2018 are available 4) repeat CBC/complete blood test and also repeat BMP/kidney test on 01/07/2018 due to low hemoglobin/anemia and slightly worsening kidney function respectively

## 2018-01-05 NOTE — Discharge Summary (Signed)
Morgan Clay, is a 72 y.o. female  DOB [birth year]  MRN [id number].  Admission date:  01/03/2018  Admitting Physician  Karmen Bongo, MD  Discharge Date:  01/05/2018   Primary MD  [name], DO  Recommendations for primary care physician for things to follow:   1)Avoid ibuprofen/Advil/Aleve/Motrin/Goody Powders/Naproxen/BC powders/Meloxicam/Diclofenac/Indomethacin and other Nonsteroidal anti-inflammatory medications as these will make you more likely to bleed and can cause stomach ulcers, can also cause Kidney problems.  2) call your GI clinic on 01/07/2018 to get results of your capsule endoscopy test 3) hold Aldactone and reduce Lasix to 20 mg daily from 40 mg until the results of your blood test on 01/07/2018 are available 4) repeat CBC/complete blood test and also repeat BMP/kidney test on 01/07/2018 due to low hemoglobin/anemia and slightly worsening kidney function respectively   Admission Diagnosis  Upper GI bleed [K92.2]   Discharge Diagnosis  Upper GI bleed [K92.2]    Principal Problem:   GI bleeding Active Problems:   Hypothyroidism   OBSTRUCTIVE SLEEP APNEA   Chronic diastolic CHF (congestive heart failure) (HCC)   Type 2 diabetes, controlled, with renal manifestation (HCC)   Chronic kidney disease - followed by [facility]   Hypertension associated with diabetes ([place])   Anemia   Atrial fibrillation, chronic ([place])      Past Medical History:  Diagnosis Date  . A-fib ([place])    on Eliquis  . AICD (automatic cardioverter/defibrillator) present 10/06/2015  . Anemia   . Arthritis    "qwhere" (01/03/2018)  . Asthma    reports mild asthma since childhood - had COPD on dx list from prior PCP  . Chronic bronchitis ([place])    "get it most years; not this past year though" (01/03/2018)  . Chronic kidney disease    "? stage;  followed by [facility]; [name]" (01/03/2018)  .  Chronic systolic congestive heart failure ([place])   . DEPRESSION 12/17/2009   Annotation: PHQ-9 score = 14 done on 12/17/2009 Qualifier: Diagnosis of  By: Hassell Done FNP, Tori Milks    . Diverticulosis   . FIBROIDS, UTERUS 03/05/2008  . Glaucoma   . Hepatitis C    HEPATITIS C - s/p treatment with Harvoni, saw hepatology, Dawn Drazek  . History of blood transfusion ~ 11/2017  . Hyperlipemia 12/06/2012  . HYPERTENSION, BENIGN 04/24/2007  . Hypothyroidism   . LBBB (left bundle branch block)   . Lower GI bleeding    "been dealing w/it since 07/2017" (01/03/2018)  . Nonischemic cardiomyopathy ([place])   . OBESITY 05/27/2009  . OBSTRUCTIVE SLEEP APNEA 11/14/2007   no CPAP  . OSTEOPENIA 09/30/2008  . Personal history of other infectious and parasitic disease    Hepatitis B  . Pneumonia    "several times" (01/03/2018)    Past Surgical History:  Procedure Laterality Date  . APPENDECTOMY    . CARDIAC CATHETERIZATION    . CARDIOVERSION N/A 12/14/2014   Procedure: CARDIOVERSION;  Surgeon: Dorothy Spark, MD;  Location: [facility];  Service: Cardiovascular;  Laterality: N/A;  . CARDIOVERSION N/A 02/26/2017   Procedure: CARDIOVERSION;  Surgeon: Evans Lance, MD;  Location: [facility];  Service: Cardiovascular;  Laterality: N/A;  . CARDIOVERSION N/A 07/24/2017   Procedure: CARDIOVERSION;  Surgeon: Thayer Headings, MD;  Location: [facility] ENDOSCOPY;  Service: Cardiovascular;  Laterality: N/A;  . COLONOSCOPY N/A 11/08/2017   Procedure: COLONOSCOPY;  Surgeon: Milus Banister, MD;  Location: [facility] ENDOSCOPY;  Service: Endoscopy;  Laterality: N/A;  . DILATION AND CURETTAGE OF UTERUS    . EP IMPLANTABLE DEVICE N/A 10/06/2015   Procedure: BIV ICD Generator Changeout;  Surgeon: [name], MD;  Location: [facility];  Service: Cardiovascular;  Laterality: N/A;  . ESOPHAGOGASTRODUODENOSCOPY N/A 10/26/2017   Procedure: ESOPHAGOGASTRODUODENOSCOPY (EGD);  Surgeon: Ladene Artist, MD;  Location: [facility]  ENDOSCOPY;  Service: Endoscopy;  Laterality: N/A;  . ESOPHAGOGASTRODUODENOSCOPY (EGD) WITH PROPOFOL N/A 11/07/2017   Procedure: ESOPHAGOGASTRODUODENOSCOPY (EGD) WITH PROPOFOL;  Surgeon: Milus Banister, MD;  Location: [facility] ENDOSCOPY;  Service: Endoscopy;  Laterality: N/A;  . HOT HEMOSTASIS N/A 10/26/2017   Procedure: HOT HEMOSTASIS (ARGON PLASMA COAGULATION/BICAP);  Surgeon: Ladene Artist, MD;  Location: [facility]OS[facility];  Service: Endoscopy;  Laterality: N/A;  . INSERT / REPLACE / REMOVE PACEMAKER  ?2008  . POLYPECTOMY  11/08/2017   Procedure: POLYPECTOMY;  Surgeon: Milus Banister, MD;  Location: [facility] ENDOSCOPY;  Service: Endoscopy;;  . TUBAL LIGATION         HPI  from the history and physical done on the day of admission:    Chief Complaint:  GI bleeding  HPI: Hollyann Pablo is a 72 y.o. female with medical history significant of OSA; NICM with chronic systolic CHF; hypothyroidism; HTN; HLD; Hep C s/p treatment with Harvoni; and atrial flutter presenting with GI bleeding.  "Same thing since March.  It's like I'm here every month, sometimes twice a month."  She came in March because they thought she had infection on her colon.  EGD in June (benign esophageal stenosis; single non-bleeding angiodysplastic lesion in the stomach treated with APC) and July (small ulcer at recent AVM APC) and colonoscopy in July (pan diverticulosis; 4 mm polyp removed). She had capsule endoscopy week before last (results pending).   She has "been saying" that she is done with this.  This AM, she had severe abdominal pain, dizziness, sweating real bad.  Her nurse called by the time she left the bathroom because they wanted her to have more blood work; the nurse instructed her to come back to the ER.  She has regular BMs.  Her stools are "jet black."  +nausea, no vomiting.  She has been transfused once in the past.  No fevers.  She is on Eliquis for afib.   ED Course:  Chronic GI bleeding, worse this week.  Now  lightheaded and dizzy.  Baseline Hgb 9.5, now 8.  Heme positive.  GI consulted and will see her.     Hospital Course:    Brief Summary 72 y.o. female with medical history significant of OSA; NICM with chronic systolic CHF; hypothyroidism; HTN; HLD; Hep C s/p treatment with Harvoni; and atrial flutter/Atrial fib?on Eliquis for anticoagulation admitted on 01/03/2018 with black stools, dizziness and syncope and hemoglobin dropping  Plan:- 1)Symptomatic anemia--pt with acute on chronic anemia secondary to ongoing GI loss in the setting of chronic anticoagulation, patient hemoglobin usually above 9, suspect some degree of anemia of CKD, on admission hemoglobin was 8.0, patient was transfuse 1 unit of  packed red blood cells , discharge hemoglobin 8.1 , repeat CBC post discharge is advised ? .? 2)AKI----acute kidney injury on CKD stage -?IV ,?suspect due to GI bleed with prerenal azotemia,????creatinine on admission= ?3.1?, ??baseline creatinine = 2??, creatinine is now= 2.83 (recent peak 3.48)??, renally adjust medications, avoid nephrotoxic agents/dehydration/hypotension, repeat BMP post discharge as advised  3)Recurrent GI bleed---?leading to anemia as above #1,?GI consult appreciated, patient had an EGD in June 2019 with benign esophageal stenosis and single nonbleeding AVM treated with APC, repeat endoscopy in July 2019 showed a small ulcer at the recent AVM treated with APC again, colonoscopy in July 2019 with pandiverticulosis, 4 mm polyp removed, patient had capsule endoscopy this August, results are pending, GI recommends p.o. PPI, patient will follow-up with GI service for results of her capsule endoscopy  4)?chronic atrial fibrillation/atrial flutter- pt is status-post DCCV, appears to be maintaining sinus rhythm, last known EF is60 to 65%, patient status post pacemaker placement,?CHA2DS2-vascular score is 5, however?HASBLED?score is 4, risk Versus benefits of ongoing anticoagulation discussed  with patient,?patient has decided to go off Eliquis given recurrent bleeding requiring transfusion, Eliquis is on hold for now,?continue amiodarone and Coreg for rate control, patient will follow up on discuss risk versus benefit of anticoagulation further with her cardiologist as outpatient  5)DM2-stable, A1c 6.8 recently,?stable ? 6)HTN/HFpEF---?stable, last known EF 60 to 65%, no acute exacerbation , continue BiDil and Coreg  7)Hypothyroidism-recent TSH was normal, continue Synthroid   Code Status : Full   Disposition Plan  : home , no further dizziness patient feels better after transfusion of 1 unit of packed cells  Consults  :  Gi  Discharge Condition: Stable  Diet and Activity recommendation:  As advised  Discharge Instructions    Discharge Instructions    (HEART FAILURE PATIENTS) Call MD:  Anytime you have any of the following symptoms: 1) 3 pound weight gain in 24 hours or 5 pounds in 1 week 2) shortness of breath, with or without a dry hacking cough 3) swelling in the hands, feet or stomach 4) if you have to sleep on extra pillows at night in order to breathe.   Complete by:  As directed    Call MD for:  difficulty breathing, headache or visual disturbances   Complete by:  As directed    Call MD for:  persistant dizziness or light-headedness   Complete by:  As directed    Call MD for:  persistant nausea and vomiting   Complete by:  As directed    Call MD for:  severe uncontrolled pain   Complete by:  As directed    Call MD for:  temperature >100.4   Complete by:  As directed    Diet - low sodium heart healthy   Complete by:  As directed    Diet Carb Modified   Complete by:  As directed    Discharge instructions   Complete by:  As directed    1)Avoid ibuprofen/Advil/Aleve/Motrin/Goody Powders/Naproxen/BC powders/Meloxicam/Diclofenac/Indomethacin and other Nonsteroidal anti-inflammatory medications as these will make you more likely to bleed and can cause stomach  ulcers, can also cause Kidney problems.  2) call your GI clinic on 01/07/2018 to get results of your capsule endoscopy test 3) hold Aldactone and reduce Lasix to 20 mg daily from 40 mg until the results of your blood test on 01/07/2018 are available 4) repeat CBC/complete blood test and also repeat BMP/kidney test on 01/07/2018 due to low hemoglobin/anemia and slightly worsening kidney function respectively  Increase activity slowly   Complete by:  As directed         Discharge Medications     Allergies as of 01/05/2018   No Known Allergies     Medication List    STOP taking these medications   ELIQUIS 5 MG Tabs tablet Generic drug:  apixaban     TAKE these medications   albuterol 108 (90 Base) MCG/ACT inhaler Commonly known as:  PROVENTIL HFA;VENTOLIN HFA Inhale 2 puffs into the lungs every 6 (six) hours as needed for wheezing or shortness of breath.   amiodarone 200 MG tablet Commonly known as:  PACERONE Take 200 mg by mouth at bedtime.   BIDIL 20-37.5 MG tablet Generic drug:  isosorbide-hydrALAZINE TAKE 1 TABLET BY MOUTH TWICE DAILY   carvedilol 25 MG tablet Commonly known as:  COREG Take 25 mg by mouth 2 (two) times daily.   furosemide 40 MG tablet Commonly known as:  LASIX Take 0.5 tablets (20 mg total) by mouth daily. What changed:  how much to take   levothyroxine 125 MCG tablet Commonly known as:  SYNTHROID, LEVOTHROID Take 125 mcg by mouth daily before breakfast.   metolazone 2.5 MG tablet Commonly known as:  ZAROXOLYN Take 1 tablet (2.5 mg total) by mouth once a week. FOR SWELLING OF WEIGHT GAIN What changed:    when to take this  additional instructions   nitroGLYCERIN 0.4 MG SL tablet Commonly known as:  NITROSTAT Place 0.4 mg under the tongue every 5 (five) minutes as needed for chest pain.   ondansetron 4 MG tablet Commonly known as:  ZOFRAN Take 1 tablet (4 mg total) by mouth every 6 (six) hours as needed for nausea.   pantoprazole 40 MG  tablet Commonly known as:  PROTONIX Take 1 tablet (40 mg total) by mouth daily.   polyethylene glycol packet Commonly known as:  MIRALAX / GLYCOLAX Take 17 g by mouth 2 (two) times daily.   sertraline 50 MG tablet Commonly known as:  ZOLOFT Take 1 tablet (50 mg total) by mouth daily.   spironolactone 25 MG tablet Commonly known as:  ALDACTONE Take 1 tablet (25 mg total) by mouth daily. Hold until after you repeat BMP blood work on 01/07/2018 What changed:  additional instructions   VISINE OP Place 1 drop into both eyes daily as needed (for dry eyes).       Major procedures and Radiology Reports - PLEASE review detailed and final reports for all details, in brief -   No results found.  Micro Results   No results found for this or any previous visit (from the past 240 hour(s)).     Today   Subjective    Cyanne Delmar today has no new complaints, feeling much better after transfusion of findings of\, no further dyspnea on exertion, no further dizziness or palpitations, family at bedside          Patient has been seen and examined prior to discharge   Objective   Blood pressure (!) 134/50, pulse (!) 59, temperature 98.4 F (36.9 C), temperature source Oral, resp. rate 18, height 4\' 11"  (1.499 m), SpO2 100 %.   Intake/Output Summary (Last 24 hours) at 01/05/2018 1237 Last data filed at 01/04/2018 1600 Gross per 24 hour  Intake 849.63 ml  Output -  Net 849.63 ml    Exam Gen:- Awake Alert,  In no apparent distress  HEENT:- [place].AT, No sclera icterus Neck-Supple Neck,No JVD,.  Lungs-  CTAB , no wheezing  CV- S1, S2 normal, pacemaker in situ Abd-  +ve B.Sounds, Abd Soft, No tenderness,    Extremity/Skin:- No  edema, good pulses Psych-affect is appropriate, oriented x3 Neuro-no new focal deficits, no tremors   Data Review   CBC w Diff:  Lab Results  Component Value Date   WBC 7.8 01/05/2018   HGB 8.1 (L) 01/05/2018   HGB 9.9 (L) 11/23/2017   HGB 8.4 (L)  10/04/2017   HCT 26.7 (L) 01/05/2018   HCT 26.1 (L) 10/04/2017   PLT 209 01/05/2018   PLT 208 11/23/2017   PLT 201 10/04/2017   LYMPHOPCT 25.7 11/27/2017   MONOPCT 7.6 11/27/2017   EOSPCT 2.6 11/27/2017   BASOPCT 0.8 11/27/2017    CMP:  Lab Results  Component Value Date   NA 137 01/05/2018   NA 138 12/31/2017   K 4.2 01/05/2018   CL 105 01/05/2018   CO2 23 01/05/2018   BUN 70 (H) 01/05/2018   BUN 99 (HH) 12/31/2017   CREATININE 2.83 (H) 01/05/2018   CREATININE 2.36 (H) 11/23/2017   CREATININE 1.26 (H) 02/25/2016   GLU 106 12/20/2016   PROT 8.6 (H) 01/03/2018   PROT 8.3 07/19/2017   ALBUMIN 4.4 01/03/2018   ALBUMIN 4.9 (H) 07/19/2017   BILITOT 0.7 01/03/2018   BILITOT 0.3 11/23/2017   ALKPHOS 55 01/03/2018   AST 27 01/03/2018   AST 14 (L) 11/23/2017   ALT 18 01/03/2018   ALT 9 11/23/2017  .   Total Discharge time is about 33 minutes  Roxan Hockey M.D on 01/05/2018 at 12:37 PM  Pager---[phone]  Go to [email or web address] - password [email or web address] for contact info  [facility] - Office  [phone]

## 2018-01-08 ENCOUNTER — Telehealth: Payer: Self-pay | Admitting: Internal Medicine

## 2018-01-08 ENCOUNTER — Telehealth: Payer: Self-pay | Admitting: Family Medicine

## 2018-01-08 NOTE — Telephone Encounter (Signed)
Transition Care Management Follow-up Telephone Call  Jodi Criscuolo, is a 72 y.o. female  DOB 02-20-1946  MRN [id number].  Admission date:  01/03/2018  Admitting Physician  Karmen Bongo, MD  Discharge Date:  01/05/2018   Primary MD  [name], DO  Recommendations for primary care physician for things to follow:   1)Avoid ibuprofen/Advil/Aleve/Motrin/Goody Powders/Naproxen/BC powders/Meloxicam/Diclofenac/Indomethacin and other Nonsteroidal anti-inflammatory medications as these will make you more likely to bleed and can cause stomach ulcers, can also cause Kidney problems.  2) call your GI clinic on 01/07/2018 to get results of your capsule endoscopy test 3) hold Aldactone and reduce Lasix to 20 mg daily from 40 mg until the results of your blood test on 01/07/2018 are available 4) repeat CBC/complete blood test and also repeat BMP/kidney test on 01/07/2018 due to low hemoglobin/anemia and slightly worsening kidney function respectively  Admission Diagnosis  Upper GI bleed [K92.2]   How have you been since you were released from the hospital? "just feeling very tired, no energy"   Do you understand why you were in the hospital? yes   Do you understand the discharge instructions? yes   Where were you discharged to? Home   Items Reviewed:  Medications reviewed: yes  Allergies reviewed: yes  Dietary changes reviewed: yes  Referrals reviewed: yes   Functional Questionnaire:   Activities of Daily Living (ADLs):   She states they are independent in the following: ambulation, bathing and hygiene, feeding, continence, grooming, toileting and dressing States they require assistance with the following: none   Any transportation issues/concerns?: no   Any patient concerns? no   Confirmed importance and date/time of follow-up visits scheduled yes  Provider Appointment booked with Dr. [name] on 01/10/2018 Thursday at 10:15 am  Confirmed with patient if condition begins  to worsen call PCP or go to the ER.  Patient was given the office number and encouraged to call back with question or concerns.  : yes

## 2018-01-08 NOTE — Telephone Encounter (Signed)
Have you read her capsule study?  She is calling for results from 8/18

## 2018-01-08 NOTE — Telephone Encounter (Signed)
I have not seen that it was done [name] was behind on these  Will try to get the results soon

## 2018-01-08 NOTE — Telephone Encounter (Signed)
I left a detailed message for the patient that we had a delay due to technical difficulties and we are sorry for the delay, but will call with the results as soon as it can be read

## 2018-01-09 NOTE — Progress Notes (Signed)
HPI:  Using dictation device. Unfortunately this device frequently misinterprets words/phrases.  Morgan Clay is a very pleasant 72 y.o. with an unfortunately complicated PMH significant for A. Fib, cardiomyopathy, CHF, s/p PM placement, HTN, COPD, CKD, Anemia and recurrent GI bleeding (multiple episodes this year) here for a hospital follow up. See transitional care phone note in Epic. Per review of discharge documents and patient: Hospitalized 8/29  to 01/05/18  Primary admitting complaint(s): GI bleeding, melena, heme positive, Hgb 8 and lightheaded on admission per notes. Seeing GI for management and per notes is to continue PPI and follow up with them outpatient.  Primary admitting diagnosis (es) and treatment:symptomatic anemia, GI bleeding on chronic anticoagulation. Hgb 8 on admission, transfused - dropped from 9 the day prior to dicharge back to 8 on discharge.he is waiting on capsule endoscopy results. Eliquis held and per inpatient notes patient is to follow up with her cardiologist outpatient to discuss risk/benefits restarting is seems. Other significant diagnosis (es) and treatment: AoCKI - managed by nephrology.  Cr 3.1 on admission to 2.83 on discharge. Lasix was reduced to 20mg  and aldactone was held and per discharge notes review pt was to have repeat labs on 9.2 and then ? Restart. It is unclear from notes where she was supposed to have these labs done? Does not appear they were done. Follow up concerns per discharge document: GI follow up, cardiology follow up, repeat labs - is unclear from discharge notes where she was supposed to do these labs on labor day Reports today: no further bleeding or dark stools, still feels tired. Is taking one tablet of lasix daily (reports was take 40mg  bid prior to hospitalization), she is not taking aldactone and reports she was told by hospitalist not to take until sees GI.  Reports she spoke with her cardiologist, Dr. Caryl Comes in the hospital -  reports he came to her hospital room. Reports he told her not to take the eliquis until she follows back up with him later this month. Reports she will see him later this month. Reports has follow up with GI doctor in a few days. Reports new concern of R foot pain, thinks hit it a few weeks ago and developed bruising and swelling. Reports is dong better but still quite sore particularly with bearing weight. Denies:bleeding, dark stools, cp, fevers   ROS: See pertinent positives and negatives per HPI.  Past Medical History:  Diagnosis Date  . A-fib ([place])    on Eliquis  . AICD (automatic cardioverter/defibrillator) present 10/06/2015  . Anemia   . Arthritis    "qwhere" (01/03/2018)  . Asthma    reports mild asthma since childhood - had COPD on dx list from prior PCP  . Chronic bronchitis ([place])    "get it most years; not this past year though" (01/03/2018)  . Chronic kidney disease    "? stage;  followed by [facility]; [name]" (01/03/2018)  . Chronic systolic congestive heart failure ([place])   . DEPRESSION 12/17/2009   Annotation: PHQ-9 score = 14 done on 12/17/2009 Qualifier: Diagnosis of  By: Hassell Done FNP, Tori Milks    . Diverticulosis   . FIBROIDS, UTERUS 03/05/2008  . Glaucoma   . Hepatitis C    HEPATITIS C - s/p treatment with Harvoni, saw hepatology, Dawn Drazek  . History of blood transfusion ~ 11/2017  . Hyperlipemia 12/06/2012  . HYPERTENSION, BENIGN 04/24/2007  . Hypothyroidism   . LBBB (left bundle branch block)   . Lower GI bleeding    "  been dealing w/it since 07/2017" (01/03/2018)  . Nonischemic cardiomyopathy ([place])   . OBESITY 05/27/2009  . OBSTRUCTIVE SLEEP APNEA 11/14/2007   no CPAP  . OSTEOPENIA 09/30/2008  . Personal history of other infectious and parasitic disease    Hepatitis B  . Pneumonia    "several times" (01/03/2018)    Past Surgical History:  Procedure Laterality Date  . APPENDECTOMY    . CARDIAC CATHETERIZATION    . CARDIOVERSION N/A 12/14/2014    Procedure: CARDIOVERSION;  Surgeon: Dorothy Spark, MD;  Location: [facility] ENDOSCOPY;  Service: Cardiovascular;  Laterality: N/A;  . CARDIOVERSION N/A 02/26/2017   Procedure: CARDIOVERSION;  Surgeon: Evans Lance, MD;  Location: [place] CV LAB;  Service: Cardiovascular;  Laterality: N/A;  . CARDIOVERSION N/A 07/24/2017   Procedure: CARDIOVERSION;  Surgeon: Thayer Headings, MD;  Location: [facility] ENDOSCOPY;  Service: Cardiovascular;  Laterality: N/A;  . COLONOSCOPY N/A 11/08/2017   Procedure: COLONOSCOPY;  Surgeon: Milus Banister, MD;  Location: [facility] ENDOSCOPY;  Service: Endoscopy;  Laterality: N/A;  . DILATION AND CURETTAGE OF UTERUS    . EP IMPLANTABLE DEVICE N/A 10/06/2015   Procedure: BIV ICD Generator Changeout;  Surgeon: [name], MD;  Location: [place] CV LAB;  Service: Cardiovascular;  Laterality: N/A;  . ESOPHAGOGASTRODUODENOSCOPY N/A 10/26/2017   Procedure: ESOPHAGOGASTRODUODENOSCOPY (EGD);  Surgeon: Ladene Artist, MD;  Location: [facility] ENDOSCOPY;  Service: Endoscopy;  Laterality: N/A;  . ESOPHAGOGASTRODUODENOSCOPY (EGD) WITH PROPOFOL N/A 11/07/2017   Procedure: ESOPHAGOGASTRODUODENOSCOPY (EGD) WITH PROPOFOL;  Surgeon: Milus Banister, MD;  Location: [facility] ENDOSCOPY;  Service: Endoscopy;  Laterality: N/A;  . HOT HEMOSTASIS N/A 10/26/2017   Procedure: HOT HEMOSTASIS (ARGON PLASMA COAGULATION/BICAP);  Surgeon: Ladene Artist, MD;  Location: [facility] ENDOSCOPY;  Service: Endoscopy;  Laterality: N/A;  . INSERT / REPLACE / REMOVE PACEMAKER  ?2008  . POLYPECTOMY  11/08/2017   Procedure: POLYPECTOMY;  Surgeon: Milus Banister, MD;  Location: [facility] ENDOSCOPY;  Service: Endoscopy;;  . TUBAL LIGATION      Family History  Problem Relation Age of Onset  . Asthma Father   . Heart attack Father   . Asthma Sister   . Lung cancer Sister   . Heart attack Mother   . Stroke Brother   . Colon cancer Neg Hx   . Esophageal cancer Neg Hx   . Pancreatic cancer Neg Hx   . Stomach cancer Neg Hx   . Liver  disease Neg Hx     SOCIAL HX: see hpi   Current Outpatient Medications:  .  albuterol (PROVENTIL HFA;VENTOLIN HFA) 108 (90 Base) MCG/ACT inhaler, Inhale 2 puffs into the lungs every 6 (six) hours as needed for wheezing or shortness of breath., Disp: 1 Inhaler, Rfl: 3 .  amiodarone (PACERONE) 200 MG tablet, Take 200 mg by mouth at bedtime. , Disp: , Rfl:  .  BIDIL 20-37.5 MG tablet, TAKE 1 TABLET BY MOUTH TWICE DAILY, Disp: 180 tablet, Rfl: 3 .  carvedilol (COREG) 25 MG tablet, Take 25 mg by mouth 2 (two) times daily., Disp: , Rfl: 3 .  furosemide (LASIX) 40 MG tablet, Take 0.5 tablets (20 mg total) by mouth daily., Disp: 60 tablet, Rfl: 0 .  levothyroxine (SYNTHROID, LEVOTHROID) 125 MCG tablet, Take 125 mcg by mouth daily before breakfast., Disp: , Rfl:  .  metolazone (ZAROXOLYN) 2.5 MG tablet, Take 1 tablet (2.5 mg total) by mouth once a week. FOR SWELLING OF WEIGHT GAIN (Patient taking differently: Take 2.5 mg by mouth  See admin instructions. Take 2.5 mg by mouth once a week as needed/as directed FOR SWELLING OF WEIGHT GAIN), Disp: 4 tablet, Rfl: 3 .  nitroGLYCERIN (NITROSTAT) 0.4 MG SL tablet, Place 0.4 mg under the tongue every 5 (five) minutes as needed for chest pain., Disp: , Rfl:  .  ondansetron (ZOFRAN) 4 MG tablet, Take 1 tablet (4 mg total) by mouth every 6 (six) hours as needed for nausea., Disp: 20 tablet, Rfl: 0 .  pantoprazole (PROTONIX) 40 MG tablet, Take 1 tablet (40 mg total) by mouth daily., Disp: 30 tablet, Rfl: 4 .  polyethylene glycol (MIRALAX / GLYCOLAX) packet, Take 17 g by mouth 2 (two) times daily., Disp: 60 each, Rfl: 2 .  sertraline (ZOLOFT) 50 MG tablet, Take 1 tablet (50 mg total) by mouth daily., Disp: 30 tablet, Rfl: 3 .  spironolactone (ALDACTONE) 25 MG tablet, Take 1 tablet (25 mg total) by mouth daily. Hold until after you repeat BMP blood work on 01/07/2018, Disp: 10 tablet, Rfl: 0 .  Tetrahydrozoline HCl (VISINE OP), Place 1 drop into both eyes daily as needed  (for dry eyes). , Disp: , Rfl:   EXAM:  Vitals:   01/10/18 1014  BP: (!) 120/50  Pulse: 60  Temp: 97.7 F (36.5 C)  SpO2: 95%    Body mass index is 30.46 kg/m.  GENERAL: vitals reviewed and listed above, alert, oriented, appears well hydrated and in no acute distress  HEENT: atraumatic, conjunttiva clear, no obvious abnormalities on inspection of external nose and ears  NECK: no obvious masses on inspection  LUNGS: clear to auscultation bilaterally, no wheezes, rales or rhonchi, good air movement  CV: HRRR today, SEM, no peripheral edema  MS: moves all extremities without noticeable abnormality, mild bruising over R 1st MTP with TTP distal MT, can move toe and foot, no break in the skin  PSYCH: pleasant and cooperative, no obvious depression or anxiety  ASSESSMENT AND PLAN:  Discussed the following assessment and plan:  Gastrointestinal hemorrhage, unspecified gastrointestinal hemorrhage type - Plan: CBC  Anemia, unspecified type - Plan: CBC  Acute renal failure with tubular necrosis (HCC) - Plan: Basic metabolic panel  Atrial fibrillation, chronic (HCC)  Hypertension associated with diabetes ([place]) - Plan: Basic metabolic panel  Nonischemic cardiomyopathy ([place])  Biventricular ICD (implantable cardioverter-defibrillator) in place  MDD (major depressive disorder), recurrent, in partial remission ([place])  Controlled type 2 diabetes mellitus with stage 4 chronic kidney disease, without long-term current use of insulin ([place]) - Plan: Hemoglobin A1c  Right foot pain - Plan: DG Foot Complete Right  Recurrent ongoing GI bleeding with significant anemia and in a patient with significant CV disease and anemia on chronic anticoagulation. Was discharged recently off of her [place] with a dropping hgb of 8 on discharge. Her lasix was decreased and aldactone was held per discharge note 2ndary to AKI. She was told to check her labs on 9/2, but was not provided orders to do so. She  still has not heard back from GI regarding a capsule endoscopy. She reports is seeing them next week and advised she could give them another call if doesn't hear back from them today and to call them immediately if any further bleeding. Healthy diet with 5 servings iron rich foods daily and water as beverage. -needs follow up with nephrologist and cardiologist for the [facility] and to help her decide on the best management options for her cardiac issues in light of the anemia and ongoing recurrent GI bleeding. She  reports she spoke face to face with her cardiologist in the hospital and is holding eliquis until she sees them later this month. Advise to contact her cardiologist once has lab results from today regarding her lasix and aldactone - BP on low end today. Will defer to cardiology and GI in terms of her [place]. She is advised to contact her cardiologist if any heart issues, wt gain, trouble breathing, etc. -labs today  -xray of the R foot issue -she wants to hold off on the flu shot until feeling better -Patient advised to return or notify a doctor immediately if symptoms worsen or persist or new concerns arise.  Patient Instructions  BEFORE YOU LEAVE: -labs -xray -follow up: 1-2 months  We have ordered labs or studies at this visit. It can take up to 1-2 weeks for results and processing. IF results require follow up or explanation, we will call you with instructions. Clinically stable results will be released to your [facility]. If you have not heard from [name] or cannot find your results in [facility] in 2 weeks please contact our office at [phone].  If you are not yet signed up for [facility], please consider signing up.  Call cardiologist about restarting aldactone and prior dose lasix once you get the labs back from today. Call your cardiologist immediately if any increased swelling, issues with breathing, chest pain or heart concerns. Follow up with your cardiologist this month.  Call your  gastroenterologist about the capsule results and follow up with them as planned next week. Call them immediately if any further bleeding or dark stools. Eat a healthy diet with 5 servings of iron rich foods daily.          [name], DO

## 2018-01-10 ENCOUNTER — Telehealth: Payer: Self-pay

## 2018-01-10 ENCOUNTER — Ambulatory Visit (INDEPENDENT_AMBULATORY_CARE_PROVIDER_SITE_OTHER): Payer: Medicare Other | Admitting: Family Medicine

## 2018-01-10 ENCOUNTER — Ambulatory Visit (INDEPENDENT_AMBULATORY_CARE_PROVIDER_SITE_OTHER): Payer: Medicare Other

## 2018-01-10 ENCOUNTER — Encounter: Payer: Self-pay | Admitting: Family Medicine

## 2018-01-10 VITALS — BP 120/50 | HR 60 | Temp 97.7°F | Ht 59.0 in | Wt 150.8 lb

## 2018-01-10 DIAGNOSIS — N184 Chronic kidney disease, stage 4 (severe): Secondary | ICD-10-CM

## 2018-01-10 DIAGNOSIS — E1159 Type 2 diabetes mellitus with other circulatory complications: Secondary | ICD-10-CM

## 2018-01-10 DIAGNOSIS — F3341 Major depressive disorder, recurrent, in partial remission: Secondary | ICD-10-CM

## 2018-01-10 DIAGNOSIS — N170 Acute kidney failure with tubular necrosis: Secondary | ICD-10-CM

## 2018-01-10 DIAGNOSIS — I10 Essential (primary) hypertension: Secondary | ICD-10-CM

## 2018-01-10 DIAGNOSIS — S9031XA Contusion of right foot, initial encounter: Secondary | ICD-10-CM | POA: Diagnosis not present

## 2018-01-10 DIAGNOSIS — E1122 Type 2 diabetes mellitus with diabetic chronic kidney disease: Secondary | ICD-10-CM | POA: Diagnosis not present

## 2018-01-10 DIAGNOSIS — M79671 Pain in right foot: Secondary | ICD-10-CM | POA: Diagnosis not present

## 2018-01-10 DIAGNOSIS — D649 Anemia, unspecified: Secondary | ICD-10-CM | POA: Diagnosis not present

## 2018-01-10 DIAGNOSIS — I428 Other cardiomyopathies: Secondary | ICD-10-CM

## 2018-01-10 DIAGNOSIS — K922 Gastrointestinal hemorrhage, unspecified: Secondary | ICD-10-CM | POA: Diagnosis not present

## 2018-01-10 DIAGNOSIS — I152 Hypertension secondary to endocrine disorders: Secondary | ICD-10-CM

## 2018-01-10 DIAGNOSIS — Z95810 Presence of automatic (implantable) cardiac defibrillator: Secondary | ICD-10-CM

## 2018-01-10 DIAGNOSIS — I482 Chronic atrial fibrillation, unspecified: Secondary | ICD-10-CM

## 2018-01-10 LAB — CBC
HEMATOCRIT: 27.2 % — AB (ref 36.0–46.0)
Hemoglobin: 8.8 g/dL — ABNORMAL LOW (ref 12.0–15.0)
MCHC: 32.5 g/dL (ref 30.0–36.0)
MCV: 88.2 fl (ref 78.0–100.0)
Platelets: 211 10*3/uL (ref 150.0–400.0)
RBC: 3.09 Mil/uL — ABNORMAL LOW (ref 3.87–5.11)
RDW: 16.6 % — AB (ref 11.5–15.5)
WBC: 7.1 10*3/uL (ref 4.0–10.5)

## 2018-01-10 LAB — BASIC METABOLIC PANEL
BUN: 43 mg/dL — ABNORMAL HIGH (ref 6–23)
CALCIUM: 9.4 mg/dL (ref 8.4–10.5)
CHLORIDE: 106 meq/L (ref 96–112)
CO2: 26 mEq/L (ref 19–32)
CREATININE: 2.5 mg/dL — AB (ref 0.40–1.20)
GFR: 24.49 mL/min — AB (ref >60.00)
Glucose, Bld: 136 mg/dL — ABNORMAL HIGH (ref 70–99)
Potassium: 3.8 mEq/L (ref 3.5–5.1)
Sodium: 139 mEq/L (ref 135–145)

## 2018-01-10 LAB — HEMOGLOBIN A1C: HEMOGLOBIN A1C: 5.4 % (ref 4.6–6.5)

## 2018-01-10 NOTE — Patient Instructions (Addendum)
BEFORE YOU LEAVE: -labs -xray -follow up: 1-2 months  We have ordered labs or studies at this visit. It can take up to 1-2 weeks for results and processing. IF results require follow up or explanation, we will call you with instructions. Clinically stable results will be released to your [facility]. If you have not heard from [name] or cannot find your results in [facility] in 2 weeks please contact our office at [phone].  If you are not yet signed up for [facility], please consider signing up.  Call cardiologist about restarting aldactone and prior dose lasix once you get the labs back from today. Call your cardiologist immediately if any increased swelling, issues with breathing, chest pain or heart concerns. Follow up with your cardiologist this month.  Call your gastroenterologist about the capsule results and follow up with them as planned next week. Call them immediately if any further bleeding or dark stools. Eat a healthy diet with 5 servings of iron rich foods daily.

## 2018-01-10 NOTE — Telephone Encounter (Signed)
Left her message to call back.

## 2018-01-10 NOTE — Telephone Encounter (Signed)
-----   Message from [name], MD sent at 01/10/2018  1:37 PM EDT ----- Regarding: Call results please Let her know she has small bowel AVM's (seen on capsule) - We cannot reach these with scopes in [facility]  She is seeing me next week and I will explain more then

## 2018-01-11 ENCOUNTER — Telehealth: Payer: Self-pay | Admitting: Internal Medicine

## 2018-01-11 NOTE — Telephone Encounter (Signed)
Spoke with RN at [facility] who was concerned with pt having SOB. She is unsure if it's anemia or fluid accumulation. I have given her the office number to [facility] so she might speak with Margarita Grizzle in the device clinic who may be able to see pt's fluid status through her device so a better assessment can be made.

## 2018-01-13 ENCOUNTER — Telehealth: Payer: Self-pay | Admitting: Cardiology

## 2018-01-13 NOTE — Telephone Encounter (Signed)
Pt called with SOB and wt gain of 6 lbs.  She can hardly walk due to SOB.  I asked her to take extra 20 of lasix and her metolazone but if no improvement to come to ER.  Her cr is elevated and most likely will need IV lasix.  She understands, in the meantime will ask office to make her an appt this week.

## 2018-01-14 ENCOUNTER — Encounter (INDEPENDENT_AMBULATORY_CARE_PROVIDER_SITE_OTHER): Payer: Self-pay

## 2018-01-14 ENCOUNTER — Ambulatory Visit: Payer: Medicare Other | Admitting: Internal Medicine

## 2018-01-14 ENCOUNTER — Encounter: Payer: Self-pay | Admitting: Internal Medicine

## 2018-01-14 VITALS — BP 128/50 | HR 64 | Ht 58.5 in | Wt 150.2 lb

## 2018-01-14 DIAGNOSIS — K5520 Angiodysplasia of colon without hemorrhage: Secondary | ICD-10-CM | POA: Diagnosis not present

## 2018-01-14 DIAGNOSIS — I272 Pulmonary hypertension, unspecified: Secondary | ICD-10-CM

## 2018-01-14 DIAGNOSIS — Z7901 Long term (current) use of anticoagulants: Secondary | ICD-10-CM

## 2018-01-14 DIAGNOSIS — D500 Iron deficiency anemia secondary to blood loss (chronic): Secondary | ICD-10-CM | POA: Diagnosis not present

## 2018-01-14 DIAGNOSIS — I482 Chronic atrial fibrillation, unspecified: Secondary | ICD-10-CM

## 2018-01-14 DIAGNOSIS — R0609 Other forms of dyspnea: Secondary | ICD-10-CM | POA: Diagnosis not present

## 2018-01-14 HISTORY — DX: Pulmonary hypertension, unspecified: I27.20

## 2018-01-14 HISTORY — DX: Angiodysplasia of colon without hemorrhage: K55.20

## 2018-01-14 NOTE — Telephone Encounter (Signed)
Patient here for office visit now.

## 2018-01-14 NOTE — Progress Notes (Signed)
Morgan Clay 72 y.o. Oct 20, 1945 [id number]  Assessment & Plan:   Encounter Diagnoses  Name Primary?  . Iron deficiency anemia due to chronic blood loss Yes  . Angiodysplasia of intestinal tract   . DOE (dyspnea on exertion)   . Pulmonary hypertension (HCC) on echocardiogram   . Chronic anticoagulation - on hold   . Chronic atrial fibrillation (HCC) + a flutter     She has symptomatic anemia at this point I think.  She might benefit from a unit of blood.  I think she needs parenteral iron as well.  I do not think  deep enteroscopy is worth that though it could be.  We will consider this a bit further.   Continue to hold Eliquis at this point given her symptoms of dyspnea and weakness and her current hemoglobin level I would not want to exacerbate anything increase any bleeding right now though we want to resume this as soon as possible.  I appreciate the opportunity to care for this patient. CC: [name], DO Dr. Zola Button Dr. Jolyn Nap   Subjective:   Chief Complaint: Anemia shortness of breath  HPI The patient is a very nice 72 year old African-American woman here with her daughter, she has had recurrent melena GI bleeding and an AVM in the stomach ablated this summer with a repeat admission after that with clean-based ulceration.  Her hemoglobin has been down her ferritin has been declining.  She had seen Dr. Alen Blew in July and he wanted further GI evaluation.  We have subsequently done a capsule endoscopy which is shown small bowel AVMs beyond the point of our push enteroscopy capabilities here.  She is currently holding her Eliquis.  She is dyspneic from room to room and very dyspneic lying flat also.  She does appear comfortable in the exam room today.  She is frustrated and tired and fatigued.  Her hemoglobin was normal 9 to 12 months ago.  Colonoscopy has been negative. No Known Allergies Current Meds  Medication Sig  . albuterol (PROVENTIL  HFA;VENTOLIN HFA) 108 (90 Base) MCG/ACT inhaler Inhale 2 puffs into the lungs every 6 (six) hours as needed for wheezing or shortness of breath.  [name] amiodarone (PACERONE) 200 MG tablet Take 200 mg by mouth at bedtime.   [name] BIDIL 20-37.5 MG tablet TAKE 1 TABLET BY MOUTH TWICE DAILY  . carvedilol (COREG) 25 MG tablet Take 25 mg by mouth 2 (two) times daily.  . furosemide (LASIX) 40 MG tablet Take 0.5 tablets (20 mg total) by mouth daily.  [name] levothyroxine (SYNTHROID, LEVOTHROID) 125 MCG tablet Take 125 mcg by mouth daily before breakfast.  . metolazone (ZAROXOLYN) 2.5 MG tablet Take 1 tablet (2.5 mg total) by mouth once a week. FOR SWELLING OF WEIGHT GAIN (Patient taking differently: Take 2.5 mg by mouth See admin instructions. Take 2.5 mg by mouth once a week as needed/as directed FOR SWELLING OF WEIGHT GAIN)  . ondansetron (ZOFRAN) 4 MG tablet Take 1 tablet (4 mg total) by mouth every 6 (six) hours as needed for nausea.  . pantoprazole (PROTONIX) 40 MG tablet Take 1 tablet (40 mg total) by mouth daily.  . polyethylene glycol (MIRALAX / GLYCOLAX) packet Take 17 g by mouth 2 (two) times daily.  . sertraline (ZOLOFT) 50 MG tablet Take 1 tablet (50 mg total) by mouth daily.  [name] spironolactone (ALDACTONE) 25 MG tablet Take 1 tablet (25 mg total) by mouth daily. Hold until after you repeat BMP blood work on  01/07/2018  . Tetrahydrozoline HCl (VISINE OP) Place 1 drop into both eyes daily as needed (for dry eyes).    Past Medical History:  Diagnosis Date  . A-fib ([place])    on Eliquis  . AICD (automatic cardioverter/defibrillator) present 10/06/2015  . Anemia   . Arthritis    "qwhere" (01/03/2018)  . Asthma    reports mild asthma since childhood - had COPD on dx list from prior PCP  . Chronic bronchitis ([place])    "get it most years; not this past year though" (01/03/2018)  . Chronic kidney disease    "? stage;  followed by [facility]; [name]" (01/03/2018)  . Chronic systolic congestive heart failure  ([place])   . DEPRESSION 12/17/2009   Annotation: PHQ-9 score = 14 done on 12/17/2009 Qualifier: Diagnosis of  By: Hassell Done FNP, Tori Milks    . Diverticulosis   . FIBROIDS, UTERUS 03/05/2008  . Glaucoma   . Hepatitis C    HEPATITIS C - s/p treatment with Harvoni, saw hepatology, Dawn Drazek  . History of blood transfusion ~ 11/2017  . Hyperlipemia 12/06/2012  . HYPERTENSION, BENIGN 04/24/2007  . Hypothyroidism   . LBBB (left bundle branch block)   . Lower GI bleeding    "been dealing w/it since 07/2017" (01/03/2018)  . Nonischemic cardiomyopathy ([place])   . OBESITY 05/27/2009  . OBSTRUCTIVE SLEEP APNEA 11/14/2007   no CPAP  . OSTEOPENIA 09/30/2008  . Personal history of other infectious and parasitic disease    Hepatitis B  . Pneumonia    "several times" (01/03/2018)   Past Surgical History:  Procedure Laterality Date  . APPENDECTOMY    . CARDIAC CATHETERIZATION    . CARDIOVERSION N/A 12/14/2014   Procedure: CARDIOVERSION;  Surgeon: Dorothy Spark, MD;  Location: [facility] ENDOSCOPY;  Service: Cardiovascular;  Laterality: N/A;  . CARDIOVERSION N/A 02/26/2017   Procedure: CARDIOVERSION;  Surgeon: Evans Lance, MD;  Location: [place] CV LAB;  Service: Cardiovascular;  Laterality: N/A;  . CARDIOVERSION N/A 07/24/2017   Procedure: CARDIOVERSION;  Surgeon: Thayer Headings, MD;  Location: [facility] ENDOSCOPY;  Service: Cardiovascular;  Laterality: N/A;  . COLONOSCOPY N/A 11/08/2017   Procedure: COLONOSCOPY;  Surgeon: Milus Banister, MD;  Location: [facility] ENDOSCOPY;  Service: Endoscopy;  Laterality: N/A;  . DILATION AND CURETTAGE OF UTERUS    . EP IMPLANTABLE DEVICE N/A 10/06/2015   Procedure: BIV ICD Generator Changeout;  Surgeon: [name], MD;  Location: [place] CV LAB;  Service: Cardiovascular;  Laterality: N/A;  . ESOPHAGOGASTRODUODENOSCOPY N/A 10/26/2017   Procedure: ESOPHAGOGASTRODUODENOSCOPY (EGD);  Surgeon: Ladene Artist, MD;  Location: [facility] ENDOSCOPY;  Service: Endoscopy;  Laterality: N/A;    . ESOPHAGOGASTRODUODENOSCOPY (EGD) WITH PROPOFOL N/A 11/07/2017   Procedure: ESOPHAGOGASTRODUODENOSCOPY (EGD) WITH PROPOFOL;  Surgeon: Milus Banister, MD;  Location: [facility] ENDOSCOPY;  Service: Endoscopy;  Laterality: N/A;  . HOT HEMOSTASIS N/A 10/26/2017   Procedure: HOT HEMOSTASIS (ARGON PLASMA COAGULATION/BICAP);  Surgeon: Ladene Artist, MD;  Location: [facility] ENDOSCOPY;  Service: Endoscopy;  Laterality: N/A;  . INSERT / REPLACE / REMOVE PACEMAKER  ?2008  . POLYPECTOMY  11/08/2017   Procedure: POLYPECTOMY;  Surgeon: Milus Banister, MD;  Location: [facility] ENDOSCOPY;  Service: Endoscopy;;  . TUBAL LIGATION     Social History   Social History Narrative   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      Home Situation: lives alone      Spiritual Beliefs: [place]  Lifestyle: no regular exercise; poor diet      She is divorced at least one adult child   Never smoker    rare alcohol to occasional at best    no substance abuse         family history includes Asthma in her father and sister; Heart attack in her father and mother; Lung cancer in her sister; Stroke in her brother.   Review of Systems As per HPI  Objective:   Physical Exam BP (!) 128/50 (BP Location: Left Arm, Patient Position: Sitting, Cuff Size: Normal)   Pulse 64   Ht 4' 10.5" (1.486 m) Comment: height measured without shoes  Wt 150 lb 4 oz (68.2 kg)   BMI 30.87 kg/m  Chron ill bw NAD Mildly dyspneic Lungs CTA Cor no rmg, Nl S1S2 - JVD w/ ? HJR abd obese, fullness RUQ and mild discomfort No c/c/e

## 2018-01-14 NOTE — Patient Instructions (Signed)
Dr Carlean Purl is going to consult with Dr Alen Blew about your treatment options and we will be back in touch with you.    I appreciate the opportunity to care for you. Silvano Rusk, MD, [facility]

## 2018-01-15 ENCOUNTER — Telehealth: Payer: Self-pay | Admitting: Oncology

## 2018-01-15 ENCOUNTER — Other Ambulatory Visit: Payer: Self-pay | Admitting: Oncology

## 2018-01-15 DIAGNOSIS — D649 Anemia, unspecified: Secondary | ICD-10-CM

## 2018-01-15 NOTE — Telephone Encounter (Signed)
Noted  

## 2018-01-15 NOTE — Telephone Encounter (Signed)
Appts scheduled patient notified per 9/10 sch msg °

## 2018-01-16 ENCOUNTER — Ambulatory Visit
Admission: RE | Admit: 2018-01-16 | Discharge: 2018-01-16 | Disposition: A | Discharge status: Home / Self Care | Payer: Medicare Other | Source: Ambulatory Visit | Attending: Family Medicine | Admitting: Family Medicine

## 2018-01-16 DIAGNOSIS — Z1231 Encounter for screening mammogram for malignant neoplasm of breast: Secondary | ICD-10-CM | POA: Diagnosis not present

## 2018-01-17 ENCOUNTER — Telehealth: Payer: Self-pay

## 2018-01-17 NOTE — Telephone Encounter (Signed)
LVM with pt's cell; wanted to check up on the pt after her phone call this past weekend.

## 2018-01-18 ENCOUNTER — Inpatient Hospital Stay: Payer: Medicare Other | Attending: Oncology

## 2018-01-18 ENCOUNTER — Inpatient Hospital Stay (HOSPITAL_BASED_OUTPATIENT_CLINIC_OR_DEPARTMENT_OTHER): Payer: Medicare Other | Admitting: Oncology

## 2018-01-18 DIAGNOSIS — Z79899 Other long term (current) drug therapy: Secondary | ICD-10-CM

## 2018-01-18 DIAGNOSIS — N189 Chronic kidney disease, unspecified: Secondary | ICD-10-CM

## 2018-01-18 DIAGNOSIS — D649 Anemia, unspecified: Secondary | ICD-10-CM | POA: Insufficient documentation

## 2018-01-18 DIAGNOSIS — D631 Anemia in chronic kidney disease: Secondary | ICD-10-CM

## 2018-01-18 DIAGNOSIS — K922 Gastrointestinal hemorrhage, unspecified: Secondary | ICD-10-CM

## 2018-01-18 HISTORY — DX: Anemia in chronic kidney disease: D63.1

## 2018-01-18 LAB — CBC WITH DIFFERENTIAL (CANCER CENTER ONLY)
BASOS ABS: 0 10*3/uL (ref 0.0–0.1)
BASOS PCT: 0 %
EOS PCT: 2 %
Eosinophils Absolute: 0.1 10*3/uL (ref 0.0–0.5)
HCT: 28.9 % — ABNORMAL LOW (ref 34.8–46.6)
Hemoglobin: 9.2 g/dL — ABNORMAL LOW (ref 11.6–15.9)
LYMPHS PCT: 21 %
Lymphs Abs: 1.5 10*3/uL (ref 0.9–3.3)
MCH: 28.3 pg (ref 25.1–34.0)
MCHC: 31.8 g/dL (ref 31.5–36.0)
MCV: 88.9 fL (ref 79.5–101.0)
MONO ABS: 0.5 10*3/uL (ref 0.1–0.9)
Monocytes Relative: 7 %
NEUTROS ABS: 4.9 10*3/uL (ref 1.5–6.5)
Neutrophils Relative %: 70 %
PLATELETS: 165 10*3/uL (ref 145–400)
RBC: 3.25 MIL/uL — AB (ref 3.70–5.45)
RDW: 16.6 % — AB (ref 11.2–14.5)
WBC Count: 7 10*3/uL (ref 3.9–10.3)

## 2018-01-18 LAB — IRON AND TIBC
IRON: 38 ug/dL — AB (ref 41–142)
Saturation Ratios: 10 % — ABNORMAL LOW (ref 21–57)
TIBC: 370 ug/dL (ref 236–444)
UIBC: 333 ug/dL

## 2018-01-18 LAB — SAMPLE TO BLOOD BANK

## 2018-01-18 LAB — FERRITIN: FERRITIN: 123 ng/mL (ref 11–307)

## 2018-01-18 NOTE — Progress Notes (Signed)
Hematology and Oncology Follow Up Visit  Morgan Clay [id number] 12/08/1945 72 y.o. 01/18/2018 8:25 AM Gaylan Gerold, Nickola Major, DO   Principle Diagnosis: Normocytic, normochromic anemia in the setting of acute GI bleed and renal insufficiency.  Prior Therapy: The patient has received blood transfusions in the past.  She also received Feraheme 510 mg IV on 09/17/2017 and again on 12/19/2017.  Current therapy: The patient will receive a dose of Feraheme 510 mg IV on 01/25/2018 and will begin Aranesp 300 mcg subcutaneously every 3 weeks starting on 01/25/2018.  Interim History: The patient is seen for a follow-up visit at the request of gastroenterology.  She was recently hospitalized for anemia.  She states that her stools were black when she went into the hospital.  During her hospitalization she received 1 unit of packed red blood cells and received Feraheme 510 mg IV x1 dose.  She also underwent an upper endoscopy which showed nonbleeding AVMs.  She reports that she has ongoing fatigue and dizziness.  She no longer has black stools.  She has not seen any other bleeding such as epistaxis, bleeding gums, hemoptysis, or hematuria.  She does not report any headaches, blurry vision, syncope or seizures. Does not report any fevers, chills or sweats.  Does not report any cough, wheezing or hemoptysis. Does not report any chest pain, palpitation, orthopnea or leg edema.  Does not report any nausea, vomiting or abdominal pain.  Does not report any constipation or diarrhea. Does not report any skeletal complaints.    Does not report frequency, urgency or hematuria.  Does not report any skin rashes or lesions. Does not report any heat or cold intolerance.  Does not report any lymphadenopathy or petechiae.  Does not report any anxiety or depression.  Remaining review of systems is negative.    Medications: Current Outpatient Medications  Medication Sig Dispense Refill  . albuterol (PROVENTIL  HFA;VENTOLIN HFA) 108 (90 Base) MCG/ACT inhaler Inhale 2 puffs into the lungs every 6 (six) hours as needed for wheezing or shortness of breath. 1 Inhaler 3  . amiodarone (PACERONE) 200 MG tablet Take 200 mg by mouth at bedtime.     [name] BIDIL 20-37.5 MG tablet TAKE 1 TABLET BY MOUTH TWICE DAILY 180 tablet 3  . carvedilol (COREG) 25 MG tablet Take 25 mg by mouth 2 (two) times daily.  3  . furosemide (LASIX) 40 MG tablet Take 0.5 tablets (20 mg total) by mouth daily. 60 tablet 0  . levothyroxine (SYNTHROID, LEVOTHROID) 125 MCG tablet Take 125 mcg by mouth daily before breakfast.    . metolazone (ZAROXOLYN) 2.5 MG tablet Take 1 tablet (2.5 mg total) by mouth once a week. FOR SWELLING OF WEIGHT GAIN (Patient taking differently: Take 2.5 mg by mouth See admin instructions. Take 2.5 mg by mouth once a week as needed/as directed FOR SWELLING OF WEIGHT GAIN) 4 tablet 3  . nitroGLYCERIN (NITROSTAT) 0.4 MG SL tablet Place 0.4 mg under the tongue every 5 (five) minutes as needed for chest pain.    [name] ondansetron (ZOFRAN) 4 MG tablet Take 1 tablet (4 mg total) by mouth every 6 (six) hours as needed for nausea. 20 tablet 0  . pantoprazole (PROTONIX) 40 MG tablet Take 1 tablet (40 mg total) by mouth daily. 30 tablet 4  . polyethylene glycol (MIRALAX / GLYCOLAX) packet Take 17 g by mouth 2 (two) times daily. 60 each 2  . sertraline (ZOLOFT) 50 MG tablet Take 1 tablet (50 mg total)  by mouth daily. 30 tablet 3  . spironolactone (ALDACTONE) 25 MG tablet Take 1 tablet (25 mg total) by mouth daily. Hold until after you repeat BMP blood work on 01/07/2018 10 tablet 0  . Tetrahydrozoline HCl (VISINE OP) Place 1 drop into both eyes daily as needed (for dry eyes).      No current facility-administered medications for this visit.      Allergies: No Known Allergies  Past Medical History, Surgical history, Social history, and Family History were reviewed and updated.  Review of Systems:  Remaining ROS negative.  Physical  Exam: Blood pressure (!) 170/57, pulse 60, temperature 97.7 F (36.5 C), temperature source Oral, resp. rate 16, height 4' 10.5" (1.486 m), weight 149 lb 1.6 oz (67.6 kg), SpO2 100 %. ECOG: 1 General appearance: alert and cooperative appeared without distress. Head: Normocephalic, without obvious abnormality Oropharynx: No oral thrush or ulcers. Eyes: No scleral icterus.  Pupils are equal and round reactive to light. Lymph nodes: Cervical, supraclavicular, and axillary nodes normal. Heart:regular rate and rhythm, S1, S2 normal, no murmur, click, rub or gallop Lung:chest clear, no wheezing, rales, normal symmetric air entry Abdomen: soft, non-tender, without masses or organomegaly. Neurological: No motor, sensory deficits.  Intact deep tendon reflexes. Skin: No rashes or lesions.  No ecchymosis or petechiae. Musculoskeletal: No joint deformity or effusion. Psychiatric: Mood and affect are appropriate.  Lab Results: Lab Results  Component Value Date   WBC 7.0 01/18/2018   HGB 9.2 (L) 01/18/2018   HCT 28.9 (L) 01/18/2018   MCV 88.9 01/18/2018   PLT 165 01/18/2018     Chemistry      Component Value Date/Time   NA 139 01/10/2018 1055   NA 138 12/31/2017 0846   K 3.8 01/10/2018 1055   CL 106 01/10/2018 1055   CO2 26 01/10/2018 1055   BUN 43 (H) 01/10/2018 1055   BUN 99 (HH) 12/31/2017 0846   CREATININE 2.50 (H) 01/10/2018 1055   CREATININE 2.36 (H) 11/23/2017 1132   CREATININE 1.26 (H) 02/25/2016 1607   GLU 106 12/20/2016      Component Value Date/Time   CALCIUM 9.4 01/10/2018 1055   ALKPHOS 55 01/03/2018 1100   AST 27 01/03/2018 1100   AST 14 (L) 11/23/2017 1132   ALT 18 01/03/2018 1100   ALT 9 11/23/2017 1132   BILITOT 0.7 01/03/2018 1100   BILITOT 0.3 11/23/2017 1132       Radiological Studies: Mm 3d Screen Breast Bilateral  Result Date: 01/16/2018 CLINICAL DATA:  Screening. EXAM: DIGITAL SCREENING BILATERAL MAMMOGRAM WITH TOMO AND CAD COMPARISON:  Previous  exam(s). ACR Breast Density Category c: The breast tissue is heterogeneously dense, which may obscure small masses. FINDINGS: There are no findings suspicious for malignancy. Cardiac pacemaker overlies the superior left breast/left axilla. Images were processed with CAD. IMPRESSION: No mammographic evidence of malignancy. A result letter of this screening mammogram will be mailed directly to the patient. RECOMMENDATION: Screening mammogram in one year. (Code:SM-B-01Y) BI-RADS CATEGORY  1: Negative. Electronically Signed   By: Fidela Salisbury M.D.   On: 01/16/2018 15:56     Impression and Plan: This is a 72 year old female with the following issues:  1.  Normocytic, normochromic anemia diagnosed in June 2019 in the setting of acute GI bleed with heme positive stools.  She had a recent hospitalization for a GI bleed and underwent an upper endoscopy which showed nonbleeding AVMs.  She received 1 unit of packed red blood cells during her hospitalization and  also received Feraheme 510 mg IV.  The patient also has renal insufficiency with a calculated GFR of about 18.  The patient's anemia is likely due to both iron deficiency and anemia due to chronic kidney disease.  Her hemoglobin is 9.2 today.  Iron studies and ferritin are pending.  The patient case was reviewed with Dr. Alen Blew.  Will administer Feraheme 510 mg IV x1 dose to help correct her iron deficiency anemia.  The risks and benefits of this medication were discussed with the patient today including possible infusion reaction.  She is agreeable to proceed.  We also discussed starting her on Aranesp 300 mcg every 3 weeks.  The risks and benefits of the medication were discussed with the patient.  She is agreeable to proceeding with this medication as well.  We anticipate that she will receive both of these next week.  2.  Melena: She denies current bleeding.  Recommend that she have continued follow-up with GI regarding this issue.  3.   Follow-up: The patient will keep her follow-up with Dr Alen Blew in November as previously scheduled.  Spent more than half the time coordinating care.    Mikey Bussing, DNP, AGPCNP-BC, AOCNP 9/13/20198:25 AM

## 2018-01-18 NOTE — Patient Instructions (Signed)

## 2018-01-22 ENCOUNTER — Other Ambulatory Visit: Payer: Self-pay | Admitting: Oncology

## 2018-01-22 DIAGNOSIS — D649 Anemia, unspecified: Secondary | ICD-10-CM

## 2018-01-23 ENCOUNTER — Telehealth: Payer: Self-pay | Admitting: *Deleted

## 2018-01-23 NOTE — Telephone Encounter (Signed)
Received voice mail message from [place], [place], that patient is scheduled for iron infusion and Aranesp on Friday, September, 20th. Per insurance, you can't receive both of these drugs on the same day. Please ask Dr. Alen Blew if we could move the Aranesp to the next week. Patient is already scheduled on 02/15/18 for lab and injection."

## 2018-01-23 NOTE — Telephone Encounter (Signed)
Ok to schedule Aransep the following day. Please send a message to scheduling.

## 2018-01-24 ENCOUNTER — Telehealth: Payer: Self-pay | Admitting: Oncology

## 2018-01-24 ENCOUNTER — Encounter (HOSPITAL_COMMUNITY): Payer: Self-pay

## 2018-01-24 ENCOUNTER — Emergency Department (HOSPITAL_COMMUNITY)
Admission: EM | Admit: 2018-01-24 | Discharge: 2018-01-24 | Disposition: A | Discharge status: Home / Self Care | Payer: Medicare Other | Attending: Emergency Medicine | Admitting: Emergency Medicine

## 2018-01-24 ENCOUNTER — Emergency Department (HOSPITAL_COMMUNITY): Payer: Medicare Other

## 2018-01-24 DIAGNOSIS — Z79899 Other long term (current) drug therapy: Secondary | ICD-10-CM | POA: Diagnosis not present

## 2018-01-24 DIAGNOSIS — I5022 Chronic systolic (congestive) heart failure: Secondary | ICD-10-CM | POA: Insufficient documentation

## 2018-01-24 DIAGNOSIS — M109 Gout, unspecified: Secondary | ICD-10-CM

## 2018-01-24 DIAGNOSIS — J45909 Unspecified asthma, uncomplicated: Secondary | ICD-10-CM | POA: Insufficient documentation

## 2018-01-24 DIAGNOSIS — N189 Chronic kidney disease, unspecified: Secondary | ICD-10-CM | POA: Insufficient documentation

## 2018-01-24 DIAGNOSIS — E039 Hypothyroidism, unspecified: Secondary | ICD-10-CM | POA: Diagnosis not present

## 2018-01-24 DIAGNOSIS — E1122 Type 2 diabetes mellitus with diabetic chronic kidney disease: Secondary | ICD-10-CM | POA: Diagnosis not present

## 2018-01-24 DIAGNOSIS — I10 Essential (primary) hypertension: Secondary | ICD-10-CM | POA: Diagnosis not present

## 2018-01-24 DIAGNOSIS — M79675 Pain in left toe(s): Secondary | ICD-10-CM | POA: Diagnosis present

## 2018-01-24 DIAGNOSIS — I130 Hypertensive heart and chronic kidney disease with heart failure and stage 1 through stage 4 chronic kidney disease, or unspecified chronic kidney disease: Secondary | ICD-10-CM | POA: Insufficient documentation

## 2018-01-24 DIAGNOSIS — M79672 Pain in left foot: Secondary | ICD-10-CM | POA: Diagnosis not present

## 2018-01-24 DIAGNOSIS — R52 Pain, unspecified: Secondary | ICD-10-CM | POA: Diagnosis not present

## 2018-01-24 MED ORDER — OXYCODONE HCL 5 MG PO CAPS: 5.0000 mg | ORAL_CAPSULE | Freq: Four times a day (QID) | ORAL | 0 refills | Status: AC | PRN

## 2018-01-24 MED ORDER — OXYCODONE HCL 5 MG PO TABS
5.0000 mg | ORAL_TABLET | Freq: Once | ORAL | Status: AC
  Administered 2018-01-24: 5 mg via ORAL
  Filled 2018-01-24: qty 1

## 2018-01-24 NOTE — ED Notes (Signed)
Patient verbalizes understanding of discharge instructions. Opportunity for questioning and answers were provided. Armband removed by staff, pt discharged from ED to home via POV  

## 2018-01-24 NOTE — ED Notes (Signed)
Pt reports waking up with left foot pain around her ankle that has been going on for a week. Pt reports she had a simliar episode last week with right foot but same resolved itself.

## 2018-01-24 NOTE — Telephone Encounter (Signed)
Appts schedule4d patient notified per 9/18 sch msg

## 2018-01-24 NOTE — ED Triage Notes (Signed)
Pt comes via [facility], woke up and went to stand up and had sudden pain in her L foot, pt has some redness and swelling around great toe, denies hx of GOUT, states this happened in her R foot a few weeks ago. Denies injury

## 2018-01-24 NOTE — ED Notes (Signed)
ED Provider at bedside. 

## 2018-01-24 NOTE — ED Provider Notes (Signed)
[facility] EMERGENCY DEPARTMENT Provider Note   CSN: [id number] Arrival date & time: 01/24/18  8469     History   Chief Complaint Chief Complaint  Patient presents with  . Foot Pain    HPI Morgan Clay is a 72 y.o. female.  Patient presents to the emergency department with a chief complaint of left great toe pain.  She states that the symptoms started today.  She states the last week she had similar symptoms in her right great toe.  She was seen by her doctor and had x-rays which reportedly showed arthritis.  She has not treated with anything.  She denies a history of gout.  She has not taken anything for symptoms.  She denies any injuries.  Denies any fevers.  The history is provided by the patient. No language interpreter was used.    Past Medical History:  Diagnosis Date  . A-fib ([place])    on Eliquis  . AICD (automatic cardioverter/defibrillator) present 10/06/2015  . Anemia   . Arthritis    "qwhere" (01/03/2018)  . Asthma    reports mild asthma since childhood - had COPD on dx list from prior PCP  . Chronic bronchitis ([place])    "get it most years; not this past year though" (01/03/2018)  . Chronic kidney disease    "? stage;  followed by [facility]; [name]" (01/03/2018)  . Chronic systolic congestive heart failure ([place])   . DEPRESSION 12/17/2009   Annotation: PHQ-9 score = 14 done on 12/17/2009 Qualifier: Diagnosis of  By: Hassell Done FNP, Tori Milks    . Diverticulosis   . FIBROIDS, UTERUS 03/05/2008  . Glaucoma   . Hepatitis C    HEPATITIS C - s/p treatment with Harvoni, saw hepatology, Dawn Drazek  . History of blood transfusion ~ 11/2017  . Hyperlipemia 12/06/2012  . HYPERTENSION, BENIGN 04/24/2007  . Hypothyroidism   . LBBB (left bundle branch block)   . Lower GI bleeding    "been dealing w/it since 07/2017" (01/03/2018)  . Nonischemic cardiomyopathy ([place])   . OBESITY 05/27/2009  . OBSTRUCTIVE SLEEP APNEA 11/14/2007   no CPAP  .  OSTEOPENIA 09/30/2008  . Personal history of other infectious and parasitic disease    Hepatitis B  . Pneumonia    "several times" (01/03/2018)    Patient Active Problem List   Diagnosis Date Noted  . Anemia in chronic kidney disease 01/18/2018  . Pulmonary hypertension ([place]) on echocardiogram 01/14/2018  . Angiodysplasia of intestinal tract 01/14/2018  . GI bleeding 01/03/2018  . Heme positive stool 12/12/2017  . Polyp of transverse colon   . Diverticulosis of colon without hemorrhage   . Melena 11/06/2017  . Anemia 10/25/2017  . Atrial fibrillation, chronic ([place]) 10/25/2017  . Aortic atherosclerosis ([place]) 08/30/2017  . Pulmonary edema, acute ([place]) 08/09/2017  . Pain and swelling of left upper extremity 08/08/2017  . ARF (acute renal failure) ([place])   . Enteritis   . Partial small bowel obstruction ([place])   . Diverticulitis 07/31/2017  . Hypertension associated with diabetes ([place]) 06/07/2017  . MDD (major depressive disorder), recurrent, in partial remission ([place]) 06/07/2017  . Asthma 05/22/2017  . Chronic obstructive pulmonary disease ([place])   . Atypical atrial flutter ([place]) 09/28/2015  . Type 2 diabetes, controlled, with renal manifestation ([place]) 11/24/2013  . Chronic kidney disease - followed by [facility] 11/24/2013  . Nonischemic cardiomyopathy ([place]) 11/22/2010  . Obesity 05/27/2009  . OSTEOPENIA 09/30/2008  . OBSTRUCTIVE SLEEP APNEA 11/14/2007  .  Chronic diastolic CHF (congestive heart failure) ([place]) 04/24/2007  . Hypothyroidism 01/28/2007  . Biventricular ICD (implantable cardioverter-defibrillator) in place 01/08/2007    Past Surgical History:  Procedure Laterality Date  . APPENDECTOMY    . CARDIAC CATHETERIZATION    . CARDIOVERSION N/A 12/14/2014   Procedure: CARDIOVERSION;  Surgeon: Dorothy Spark, MD;  Location: [facility] ENDOSCOPY;  Service: Cardiovascular;  Laterality: N/A;  . CARDIOVERSION N/A 02/26/2017   Procedure: CARDIOVERSION;  Surgeon: Evans Lance,  MD;  Location: [facility] CV LAB;  Service: Cardiovascular;  Laterality: N/A;  . CARDIOVERSION N/A 07/24/2017   Procedure: CARDIOVERSION;  Surgeon: Thayer Headings, MD;  Location: [facility] ENDOSCOPY;  Service: Cardiovascular;  Laterality: N/A;  . COLONOSCOPY N/A 11/08/2017   Procedure: COLONOSCOPY;  Surgeon: Milus Banister, MD;  Location: [facility] ENDOSCOPY;  Service: Endoscopy;  Laterality: N/A;  . DILATION AND CURETTAGE OF UTERUS    . EP IMPLANTABLE DEVICE N/A 10/06/2015   Procedure: BIV ICD Generator Changeout;  Surgeon: [name], MD;  Location: [facility] CV LAB;  Service: Cardiovascular;  Laterality: N/A;  . ESOPHAGOGASTRODUODENOSCOPY N/A 10/26/2017   Procedure: ESOPHAGOGASTRODUODENOSCOPY (EGD);  Surgeon: Ladene Artist, MD;  Location: [facility] ENDOSCOPY;  Service: Endoscopy;  Laterality: N/A;  . ESOPHAGOGASTRODUODENOSCOPY (EGD) WITH PROPOFOL N/A 11/07/2017   Procedure: ESOPHAGOGASTRODUODENOSCOPY (EGD) WITH PROPOFOL;  Surgeon: Milus Banister, MD;  Location: [facility] ENDOSCOPY;  Service: Endoscopy;  Laterality: N/A;  . HOT HEMOSTASIS N/A 10/26/2017   Procedure: HOT HEMOSTASIS (ARGON PLASMA COAGULATION/BICAP);  Surgeon: Ladene Artist, MD;  Location: [facility] ENDOSCOPY;  Service: Endoscopy;  Laterality: N/A;  . INSERT / REPLACE / REMOVE PACEMAKER  ?2008  . POLYPECTOMY  11/08/2017   Procedure: POLYPECTOMY;  Surgeon: Milus Banister, MD;  Location: [facility] ENDOSCOPY;  Service: Endoscopy;;  . TUBAL LIGATION       OB History   None      Home Medications    Prior to Admission medications   Medication Sig Start Date End Date Taking? Authorizing Provider  albuterol (PROVENTIL HFA;VENTOLIN HFA) 108 (90 Base) MCG/ACT inhaler Inhale 2 puffs into the lungs every 6 (six) hours as needed for wheezing or shortness of breath. 01/05/18   Roxan Hockey, MD  amiodarone (PACERONE) 200 MG tablet Take 200 mg by mouth at bedtime.     [provider]  BIDIL 20-37.5 MG tablet TAKE 1 TABLET BY MOUTH TWICE DAILY 04/26/17    [name], MD  carvedilol (COREG) 25 MG tablet Take 25 mg by mouth 2 (two) times daily. 12/17/17   [provider]  furosemide (LASIX) 40 MG tablet Take 0.5 tablets (20 mg total) by mouth daily. 01/05/18   Roxan Hockey, MD  levothyroxine (SYNTHROID, LEVOTHROID) 125 MCG tablet Take 125 mcg by mouth daily before breakfast.    [provider]  metolazone (ZAROXOLYN) 2.5 MG tablet Take 1 tablet (2.5 mg total) by mouth once a week. FOR SWELLING OF WEIGHT GAIN Patient taking differently: Take 2.5 mg by mouth See admin instructions. Take 2.5 mg by mouth once a week as needed/as directed FOR SWELLING OF WEIGHT GAIN 12/24/17 03/24/18  Baldwin Jamaica, PA-C  nitroGLYCERIN (NITROSTAT) 0.4 MG SL tablet Place 0.4 mg under the tongue every 5 (five) minutes as needed for chest pain.    [provider]  ondansetron (ZOFRAN) 4 MG tablet Take 1 tablet (4 mg total) by mouth every 6 (six) hours as needed for nausea. 01/05/18   Roxan Hockey, MD  pantoprazole (PROTONIX) 40 MG tablet  Take 1 tablet (40 mg total) by mouth daily. 01/05/18   Roxan Hockey, MD  polyethylene glycol (MIRALAX / GLYCOLAX) packet Take 17 g by mouth 2 (two) times daily. 01/05/18   Roxan Hockey, MD  sertraline (ZOLOFT) 50 MG tablet Take 1 tablet (50 mg total) by mouth daily. 06/26/17   [name], DO  spironolactone (ALDACTONE) 25 MG tablet Take 1 tablet (25 mg total) by mouth daily. Hold until after you repeat BMP blood work on 01/07/2018 01/05/18   Roxan Hockey, MD  Tetrahydrozoline HCl (VISINE OP) Place 1 drop into both eyes daily as needed (for dry eyes).     [provider]    Family History Family History  Problem Relation Age of Onset  . Asthma Father   . Heart attack Father   . Asthma Sister   . Lung cancer Sister   . Heart attack Mother   . Stroke Brother   . Colon cancer Neg Hx   . Esophageal cancer Neg Hx   . Pancreatic cancer Neg Hx   . Stomach cancer Neg Hx   . Liver  disease Neg Hx     Social History Social History   Tobacco Use  . Smoking status: Never Smoker  . Smokeless tobacco: Never Used  Substance Use Topics  . Alcohol use: Yes    Comment: 01/03/2018 "couple drinks/month"  . Drug use: No     Allergies   Patient has no known allergies.   Review of Systems Review of Systems  All other systems reviewed and are negative.    Physical Exam Updated Vital Signs BP (!) 154/63 (BP Location: Right Arm)   Pulse (!) 59   Temp (!) 97.3 F (36.3 C) (Oral)   Resp 17   Ht 4\' 11"  (1.499 m)   Wt 66.7 kg   SpO2 100%   BMI 29.69 kg/m   Physical Exam  Constitutional: She is oriented to person, place, and time. No distress.  HENT:  Head: Normocephalic and atraumatic.  Eyes: Pupils are equal, round, and reactive to light. Conjunctivae and EOM are normal.  Neck: No tracheal deviation present.  Cardiovascular: Normal rate and intact distal pulses.  Intact distal pulses with brisk capillary refill  Pulmonary/Chest: Effort normal. No respiratory distress.  Abdominal: Soft.  Musculoskeletal: Normal range of motion.  Tenderness to palpation about the left great toe no bony abnormality or deformity  Neurological: She is alert and oriented to person, place, and time.  Skin: Skin is warm and dry. She is not diaphoretic.  No erythema, no cellulitis, no evidence of abscess  Psychiatric: Judgment normal.  Nursing note and vitals reviewed.    ED Treatments / Results  Labs (all labs ordered are listed, but only abnormal results are displayed) Labs Reviewed - No data to display  EKG None  Radiology Dg Foot Complete Left  Result Date: 01/24/2018 CLINICAL DATA:  Left foot pain for 1 day. No known injury. EXAM: LEFT FOOT - COMPLETE 3+ VIEW COMPARISON:  11/12/2012 FINDINGS: Diffuse bone demineralization. Degenerative changes in the first metatarsal-phalangeal joint. No evidence of acute fracture or dislocation. No focal bone lesion or bone  destruction. Bone cortex appears intact. Soft tissues are unremarkable. Vascular calcifications. IMPRESSION: Degenerative changes in the first metatarsal-phalangeal joint. No acute bony abnormalities. Electronically Signed   By: Lucienne Capers M.D.   On: 01/24/2018 04:02    Procedures Procedures (including critical care time)  Medications Ordered in ED Medications  oxyCODONE (Oxy IR/ROXICODONE) immediate release tablet  5 mg (has no administration in time range)     Initial Impression / Assessment and Plan / ED Course  I have reviewed the triage vital signs and the nursing notes.  Pertinent labs & imaging results that were available during my care of the patient were reviewed by me and considered in my medical decision making (see chart for details).     Patient with left great toe pain.  Exquisitely tender, but no bony abnormality or deformity.  Vital signs are stable.  No evidence of infection.  Plain films show degenerative changes.  She had similar symptoms in her right great toe last week.  I suspect that this is gout.  I doubt septic arthritis.  We will give pain medicine and recommend PCP follow-up.  Patient understands and agrees with plan.  [place] narcotic database reviewed, no narcotic prescriptions found in the past year.  Final Clinical Impressions(s) / ED Diagnoses   Final diagnoses:  Acute gout involving toe of left foot, unspecified cause    ED Discharge Orders         Ordered    oxycodone (OXY-IR) 5 MG capsule  Every 6 hours PRN     01/24/18 0507           Montine Circle, PA-C 01/24/18 0510    Leonette Monarch Grayce Sessions, MD 01/24/18 [phone]

## 2018-01-25 ENCOUNTER — Other Ambulatory Visit: Payer: Self-pay

## 2018-01-25 ENCOUNTER — Inpatient Hospital Stay: Payer: Medicare Other

## 2018-01-25 DIAGNOSIS — Z79899 Other long term (current) drug therapy: Secondary | ICD-10-CM | POA: Diagnosis not present

## 2018-01-25 DIAGNOSIS — K922 Gastrointestinal hemorrhage, unspecified: Secondary | ICD-10-CM | POA: Diagnosis not present

## 2018-01-25 DIAGNOSIS — N189 Chronic kidney disease, unspecified: Secondary | ICD-10-CM | POA: Diagnosis not present

## 2018-01-25 DIAGNOSIS — D649 Anemia, unspecified: Secondary | ICD-10-CM | POA: Diagnosis not present

## 2018-01-25 DIAGNOSIS — D631 Anemia in chronic kidney disease: Secondary | ICD-10-CM

## 2018-01-25 MED ORDER — SODIUM CHLORIDE 0.9 % IV SOLN
510.0000 mg | Freq: Once | INTRAVENOUS | Status: AC
  Administered 2018-01-25: 510 mg via INTRAVENOUS
  Filled 2018-01-25: qty 17

## 2018-01-25 MED ORDER — SODIUM CHLORIDE 0.9 % IV SOLN
Freq: Once | INTRAVENOUS | Status: AC
  Administered 2018-01-25: 10:00:00 via INTRAVENOUS
  Filled 2018-01-25: qty 250

## 2018-01-25 NOTE — Patient Instructions (Signed)

## 2018-01-28 ENCOUNTER — Ambulatory Visit (INDEPENDENT_AMBULATORY_CARE_PROVIDER_SITE_OTHER): Payer: Medicare Other

## 2018-01-28 DIAGNOSIS — Z95810 Presence of automatic (implantable) cardiac defibrillator: Secondary | ICD-10-CM | POA: Diagnosis not present

## 2018-01-28 DIAGNOSIS — I5032 Chronic diastolic (congestive) heart failure: Secondary | ICD-10-CM

## 2018-01-28 NOTE — Progress Notes (Signed)
EPIC Encounter for ICM Monitoring  Patient Name: Morgan Clay is a 72 y.o. female Date: 01/28/2018 Primary Care Physican: [name], DO Primary [facility] Electrophysiologist: Caryl Comes Nephrologist: [name] Dry Weight:149lbs (Baseline~140lbs) Bi-V Pacing: 100%            Heart Failure questions reviewed, pt continues to report weight gain, tiredness and shortness of breath even though she is taking Metolazone 2.5 mg every Monday.  Patient had 2 ER visit since last ICM follow up for fluid and anemia.     Thoracic impedance abnormal suggesting fluid accumulation since 01/01/2018.  Prescribed: Furosemide 40 mg take 0.5tablet(20 mg total)daily. Metolazone 2.5 mg weekly for swelling/wt gain and she takes it every Monday.      Labs: 01/18/2018 Hgb 9.2 01/10/2018 Creatinine 2.50, BUN 43, Potassium 3.8, Sodium 139 01/05/2018 Creatinine 2.83, BUN 70, Potassium 4.2, Sodium 137, EGFR 16-18  01/04/2018 Creatinine 2.84, BUN 88, Potassium 3.9, Sodium 138, EGFR 16-18  01/03/2018 Creatinine 3.16, BUN 96, Potassium 4.5, Sodium 138, EGFR 14-16  12/31/2017 Creatinine 3.48, BUN 99, Potassium 4.8, Sodium 138, EGFR 13-15  12/24/2017 Creatinine 3.36, BUN 72, Potassium 4.0, Sodium 136, EGFR 13-15  12/12/2017 Creatinine 2.84, BUN 52, Potassium 3.8, Sodium 139  11/23/2017 Creatinine2.36, BUN58, Potassium4.2, Sodium140, [id number] 11/08/2017 Creatinine2.13, BUN44, Potassium3.6, [id number], [id number]  11/07/2017 Creatinine2.10, BUN52, Potassium4.0, Sodium141, [id number]  11/06/2017 Creatinine2.07, BUN51, Potassium5.3, [id number], [id number] 10/26/2017 Creatinine2.11, BUN52, Potassium4.6, Sodium141 10/25/2017 Creatinine2.70, BUN77, Potassium4.7, Sodium139  08/30/2017 Creatinine1.96, BUN28, Potassium4.2, Sodium141, [id number]  08/17/2017 Creatinine1.62, BUN34, Potassium4.1, Sodium136  08/16/2017 Creatinine1.69, BUN34, Potassium3.3, [id number]    08/15/2017 Creatinine1.69, BUN38, Potassium3.2, Sodium136  08/14/2017 Creatinine1.80, BUN38, Potassium4.3, [id number]  08/12/2017 Creatinine2.31, BUN41, Potassium4.7, Sodium135 A complete set of results can be found in Results Review.  Recommendations:  She is receiving Ferumoxytol IV infusion for anemia.   Advise if symptoms worsen to use ER.  Patient has PCP appointment today to discuss current symptoms and treatment plan.   Follow-up plan: ICM clinic phone appointment on 02/04/2018 to recheck fluid levels.   Office appointment scheduled 02/08/2018 with Dr. [name] (1st CHF clinic visit).    Copy of ICM check sent to Dr. Caryl Comes.   3 month ICM trend: 01/28/2018    1 Year ICM trend:       [name], RN 01/28/2018 2:05 PM

## 2018-01-28 NOTE — Progress Notes (Addendum)
HPI:  Using dictation device. Unfortunately this device frequently misinterprets words/phrases.   Morgan Clay is a pleasant 72 yo with a complicated PMH, seen below, here for an acute visit forL foot pain and depression. She had pain in the Right foot a few weeks, ago, now in the L foot for 1-2 weeks and went to the emergency room recently for this.  They told her it was gout and gave her oxycodone for pain.  They did not give her any treatment for gout nor do any diagnostic studies for gout.  The x-ray showed degenerative changes, which also was seen on x-ray of the other foot prior.  She thinks she had some swelling around the MCP and ankle.  No known injury.  The oxycodone helps with the pain some.  No weakness or numbness. She also is struggling with depression.  She has had been through a lot with her health and this is starting to get her down.  She is already on Zoloft 50 for depression.  Has a depressed mood most days.  No suicidal ideations.  She is agreeable to considering counseling.  ROS: See pertinent positives and negatives per HPI.  Past Medical History:  Diagnosis Date  . A-fib ([place])    on Eliquis  . AICD (automatic cardioverter/defibrillator) present 10/06/2015  . Anemia   . Arthritis    "qwhere" (01/03/2018)  . Asthma    reports mild asthma since childhood - had COPD on dx list from prior PCP  . Chronic bronchitis ([place])    "get it most years; not this past year though" (01/03/2018)  . Chronic kidney disease    "? stage;  followed by [facility]; [name]" (01/03/2018)  . Chronic systolic congestive heart failure ([place])   . DEPRESSION 12/17/2009   Annotation: PHQ-9 score = 14 done on 12/17/2009 Qualifier: Diagnosis of  By: Hassell Done FNP, Tori Milks    . Diverticulosis   . FIBROIDS, UTERUS 03/05/2008  . Glaucoma   . Hepatitis C    HEPATITIS C - s/p treatment with Harvoni, saw hepatology, Dawn Drazek  . History of blood transfusion ~ 11/2017  . Hyperlipemia 12/06/2012   . HYPERTENSION, BENIGN 04/24/2007  . Hypothyroidism   . LBBB (left bundle branch block)   . Lower GI bleeding    "been dealing w/it since 07/2017" (01/03/2018)  . Nonischemic cardiomyopathy ([place])   . OBESITY 05/27/2009  . OBSTRUCTIVE SLEEP APNEA 11/14/2007   no CPAP  . OSTEOPENIA 09/30/2008  . Personal history of other infectious and parasitic disease    Hepatitis B  . Pneumonia    "several times" (01/03/2018)    Past Surgical History:  Procedure Laterality Date  . APPENDECTOMY    . CARDIAC CATHETERIZATION    . CARDIOVERSION N/A 12/14/2014   Procedure: CARDIOVERSION;  Surgeon: Dorothy Spark, MD;  Location: [facility] ENDOSCOPY;  Service: Cardiovascular;  Laterality: N/A;  . CARDIOVERSION N/A 02/26/2017   Procedure: CARDIOVERSION;  Surgeon: Evans Lance, MD;  Location: [facility] CV LAB;  Service: Cardiovascular;  Laterality: N/A;  . CARDIOVERSION N/A 07/24/2017   Procedure: CARDIOVERSION;  Surgeon: Thayer Headings, MD;  Location: [facility] ENDOSCOPY;  Service: Cardiovascular;  Laterality: N/A;  . COLONOSCOPY N/A 11/08/2017   Procedure: COLONOSCOPY;  Surgeon: Milus Banister, MD;  Location: [facility] ENDOSCOPY;  Service: Endoscopy;  Laterality: N/A;  . DILATION AND CURETTAGE OF UTERUS    . EP IMPLANTABLE DEVICE N/A 10/06/2015   Procedure: BIV ICD Generator Changeout;  Surgeon: [name], MD;  [facility]: [facility];  Service: Cardiovascular;  Laterality: N/A;  . ESOPHAGOGASTRODUODENOSCOPY N/A 10/26/2017   Procedure: ESOPHAGOGASTRODUODENOSCOPY (EGD);  Surgeon: Ladene Artist, MD;  Location: [facility];  Service: Endoscopy;  Laterality: N/A;  . ESOPHAGOGASTRODUODENOSCOPY (EGD) WITH PROPOFOL N/A 11/07/2017   Procedure: ESOPHAGOGASTRODUODENOSCOPY (EGD) WITH PROPOFOL;  Surgeon: Milus Banister, MD;  Location: [facility] ENDOSCOPY;  Service: Endoscopy;  Laterality: N/A;  . HOT HEMOSTASIS N/A 10/26/2017   Procedure: HOT HEMOSTASIS (ARGON PLASMA COAGULATION/BICAP);  Surgeon: Ladene Artist, MD;   Location: [facility] ENDOSCOPY;  Service: Endoscopy;  Laterality: N/A;  . INSERT / REPLACE / REMOVE PACEMAKER  ?2008  . POLYPECTOMY  11/08/2017   Procedure: POLYPECTOMY;  Surgeon: Milus Banister, MD;  Location: [facility] ENDOSCOPY;  Service: Endoscopy;;  . TUBAL LIGATION      Family History  Problem Relation Age of Onset  . Asthma Father   . Heart attack Father   . Asthma Sister   . Lung cancer Sister   . Heart attack Mother   . Stroke Brother   . Colon cancer Neg Hx   . Esophageal cancer Neg Hx   . Pancreatic cancer Neg Hx   . Stomach cancer Neg Hx   . Liver disease Neg Hx     SOCIAL HX: see hpi   Current Outpatient Medications:  .  albuterol (PROVENTIL HFA;VENTOLIN HFA) 108 (90 Base) MCG/ACT inhaler, Inhale 2 puffs into the lungs every 6 (six) hours as needed for wheezing or shortness of breath., Disp: 1 Inhaler, Rfl: 3 .  amiodarone (PACERONE) 200 MG tablet, Take 200 mg by mouth at bedtime. , Disp: , Rfl:  .  BIDIL 20-37.5 MG tablet, TAKE 1 TABLET BY MOUTH TWICE DAILY, Disp: 180 tablet, Rfl: 3 .  carvedilol (COREG) 25 MG tablet, Take 25 mg by mouth 2 (two) times daily., Disp: , Rfl: 3 .  furosemide (LASIX) 40 MG tablet, Take 0.5 tablets (20 mg total) by mouth daily., Disp: 60 tablet, Rfl: 0 .  levothyroxine (SYNTHROID, LEVOTHROID) 125 MCG tablet, Take 125 mcg by mouth daily before breakfast., Disp: , Rfl:  .  metolazone (ZAROXOLYN) 2.5 MG tablet, Take 1 tablet (2.5 mg total) by mouth once a week. FOR SWELLING OF WEIGHT GAIN (Patient taking differently: Take 2.5 mg by mouth See admin instructions. Take 2.5 mg by mouth once a week as needed/as directed FOR SWELLING OF WEIGHT GAIN), Disp: 4 tablet, Rfl: 3 .  nitroGLYCERIN (NITROSTAT) 0.4 MG SL tablet, Place 0.4 mg under the tongue every 5 (five) minutes as needed for chest pain., Disp: , Rfl:  .  pantoprazole (PROTONIX) 40 MG tablet, Take 1 tablet (40 mg total) by mouth daily., Disp: 30 tablet, Rfl: 4 .  polyethylene glycol (MIRALAX / GLYCOLAX)  packet, Take 17 g by mouth 2 (two) times daily., Disp: 60 each, Rfl: 2 .  sertraline (ZOLOFT) 50 MG tablet, Take 1.5 tablets (75 mg total) by mouth daily., Disp: 135 tablet, Rfl: 3 .  spironolactone (ALDACTONE) 25 MG tablet, Take 1 tablet (25 mg total) by mouth daily. Hold until after you repeat BMP blood work on 01/07/2018, Disp: 10 tablet, Rfl: 0 .  Tetrahydrozoline HCl (VISINE OP), Place 1 drop into both eyes daily as needed (for dry eyes). , Disp: , Rfl:  .  predniSONE (DELTASONE) 20 MG tablet, Take 1 tablet (20 mg total) by mouth daily with breakfast., Disp: 5 tablet, Rfl: 0  EXAM:  Vitals:   01/29/18 1133  BP: (!) 128/58  Pulse: 60  Temp: 97.8 F (36.6 C)  SpO2: 97%    Body mass index is 29.69 kg/m.  GENERAL: vitals reviewed and listed above, alert, oriented, appears well hydrated and in no acute distress  HEENT: atraumatic, conjunttiva clear, no obvious abnormalities on inspection of external nose and ears  NECK: no obvious masses on inspection  LUNGS: clear to auscultation bilaterally, no wheezes, rales or rhonchi, good air movement  CV: HRRR, no peripheral edema  MS: moves all extremities without noticeable abnormality, TTP R MTP jt, no sig swelling, warmth. Mild hyperpigmentation. Callus bottom of foot.  PSYCH: pleasant and cooperative, no obvious depression or anxiety  ASSESSMENT AND PLAN:  Discussed the following assessment and plan:  Pain of toe of left foot - Plan: Uric Acid  Depression, unspecified depression type  -we discussed possible serious and likely etiologies, workup and treatment, treatment risks and return precautions -possible gout in the setting of diabetes, OAC, hx GI bleed, chronic kidney disease, multiple co morbidities, multiple joints/flares recently, current depression and does not want to wait for tx.  The pain is still bad.  We discussed various options and risk and she decided to try a short course of lower dose prednisone.  We will check a  uric acid.  Also referring to podiatry for removal of the callus and for the toe pain.  She may need to recheck uric acid in a few weeks if initially abnormal.  Tylenol for pain as needed.  Discussed risk of hydrocodone and did not continue this. -Discussed treatment options for depression and we have decided to increase the Zoloft and also advised CBT.  She agrees to call to schedule with Lanny Hurst. -follow up advised 1 month -of course, we advised Pollyann  to return or notify a doctor immediately if symptoms worsen or persist or new concerns arise.   Patient Instructions  BEFORE YOU LEAVE: -lab -follow up: 1 month  Take the prednisone 20mg  daily for 3-5 days   Use tylenol 500-1000mg  up to 3 times daily for pain as needed instead of oxycodone  -We placed a referral for you as discussed to the foot specialist. It usually takes about 1-2 weeks to process and schedule this referral. If you have not heard from [name] regarding this appointment in 2 weeks please contact our office.  Increase zoloft to 75mg  daily.  Call to schedule counseling for depression with Dennison Bulla.  I hope you are feeling better soon! Seek care promptly if your symptoms worsen, new concerns arise or you are not improving with treatment.      [name], DO

## 2018-01-29 ENCOUNTER — Ambulatory Visit (INDEPENDENT_AMBULATORY_CARE_PROVIDER_SITE_OTHER): Payer: Medicare Other | Admitting: Family Medicine

## 2018-01-29 ENCOUNTER — Encounter: Payer: Self-pay | Admitting: Family Medicine

## 2018-01-29 VITALS — BP 128/58 | HR 60 | Temp 97.8°F | Ht 59.0 in

## 2018-01-29 DIAGNOSIS — M79675 Pain in left toe(s): Secondary | ICD-10-CM | POA: Diagnosis not present

## 2018-01-29 DIAGNOSIS — F329 Major depressive disorder, single episode, unspecified: Secondary | ICD-10-CM

## 2018-01-29 DIAGNOSIS — F32A Depression, unspecified: Secondary | ICD-10-CM

## 2018-01-29 DIAGNOSIS — M109 Gout, unspecified: Secondary | ICD-10-CM | POA: Diagnosis not present

## 2018-01-29 LAB — URIC ACID: URIC ACID, SERUM: 14.6 mg/dL — AB (ref 2.4–7.0)

## 2018-01-29 MED ORDER — ALLOPURINOL 100 MG PO TABS: ORAL_TABLET | ORAL | 1 refills | Status: DC

## 2018-01-29 MED ORDER — SERTRALINE HCL 50 MG PO TABS: 75.0000 mg | ORAL_TABLET | Freq: Every day | ORAL | 3 refills | Status: DC

## 2018-01-29 MED ORDER — PREDNISONE 20 MG PO TABS: 20.0000 mg | ORAL_TABLET | Freq: Every day | ORAL | 0 refills | Status: DC

## 2018-01-29 NOTE — Addendum Note (Signed)
Addended by: Agnes Lawrence on: 01/29/2018 03:42 PM   Modules accepted: Orders

## 2018-01-29 NOTE — Patient Instructions (Addendum)
BEFORE YOU LEAVE: -lab -follow up: 1 month  Take the prednisone 20mg  daily for 3-5 days   Use tylenol 500-1000mg  up to 3 times daily for pain as needed instead of oxycodone  -We placed a referral for you as discussed to the foot specialist. It usually takes about 1-2 weeks to process and schedule this referral. If you have not heard from [name] regarding this appointment in 2 weeks please contact our office.  Increase zoloft to 75mg  daily.  Call to schedule counseling for depression with Dennison Bulla.  I hope you are feeling better soon! Seek care promptly if your symptoms worsen, new concerns arise or you are not improving with treatment.

## 2018-01-29 NOTE — Progress Notes (Signed)
Reviewed with Dr Caryl Comes.  No changes at this time.  Continue to monitor.

## 2018-01-29 NOTE — Addendum Note (Signed)
Addended by: Agnes Lawrence on: 01/29/2018 03:40 PM   Modules accepted: Orders

## 2018-01-31 ENCOUNTER — Ambulatory Visit: Payer: Medicare Other | Admitting: Family Medicine

## 2018-01-31 ENCOUNTER — Inpatient Hospital Stay: Payer: Medicare Other

## 2018-01-31 VITALS — BP 132/59 | HR 63 | Temp 98.2°F | Resp 17

## 2018-01-31 DIAGNOSIS — D649 Anemia, unspecified: Secondary | ICD-10-CM | POA: Diagnosis not present

## 2018-01-31 DIAGNOSIS — D631 Anemia in chronic kidney disease: Secondary | ICD-10-CM

## 2018-01-31 DIAGNOSIS — Z79899 Other long term (current) drug therapy: Secondary | ICD-10-CM | POA: Diagnosis not present

## 2018-01-31 DIAGNOSIS — N189 Chronic kidney disease, unspecified: Secondary | ICD-10-CM | POA: Diagnosis not present

## 2018-01-31 DIAGNOSIS — K922 Gastrointestinal hemorrhage, unspecified: Secondary | ICD-10-CM | POA: Diagnosis not present

## 2018-01-31 LAB — CBC WITH DIFFERENTIAL (CANCER CENTER ONLY)
Basophils Absolute: 0 10*3/uL (ref 0.0–0.1)
Basophils Relative: 0 %
EOS ABS: 0 10*3/uL (ref 0.0–0.5)
Eosinophils Relative: 0 %
HEMATOCRIT: 32.2 % — AB (ref 34.8–46.6)
HEMOGLOBIN: 10.2 g/dL — AB (ref 11.6–15.9)
LYMPHS ABS: 0.6 10*3/uL — AB (ref 0.9–3.3)
Lymphocytes Relative: 8 %
MCH: 28.1 pg (ref 25.1–34.0)
MCHC: 31.7 g/dL (ref 31.5–36.0)
MCV: 88.7 fL (ref 79.5–101.0)
MONO ABS: 0.2 10*3/uL (ref 0.1–0.9)
MONOS PCT: 3 %
NEUTROS PCT: 89 %
Neutro Abs: 6.5 10*3/uL (ref 1.5–6.5)
Platelet Count: 220 10*3/uL (ref 145–400)
RBC: 3.63 MIL/uL — ABNORMAL LOW (ref 3.70–5.45)
RDW: 17 % — AB (ref 11.2–14.5)
WBC Count: 7.3 10*3/uL (ref 3.9–10.3)

## 2018-01-31 MED ORDER — DARBEPOETIN ALFA 300 MCG/0.6ML IJ SOSY
300.0000 ug | PREFILLED_SYRINGE | Freq: Once | INTRAMUSCULAR | Status: AC
  Administered 2018-01-31: 300 ug via SUBCUTANEOUS

## 2018-01-31 MED ORDER — DARBEPOETIN ALFA 300 MCG/0.6ML IJ SOSY
PREFILLED_SYRINGE | INTRAMUSCULAR | Status: AC
  Filled 2018-01-31: qty 0.6

## 2018-01-31 NOTE — Patient Instructions (Signed)

## 2018-02-04 ENCOUNTER — Ambulatory Visit (INDEPENDENT_AMBULATORY_CARE_PROVIDER_SITE_OTHER): Payer: Medicare Other

## 2018-02-04 ENCOUNTER — Ambulatory Visit (INDEPENDENT_AMBULATORY_CARE_PROVIDER_SITE_OTHER): Payer: Medicare Other | Admitting: *Deleted

## 2018-02-04 DIAGNOSIS — Z95810 Presence of automatic (implantable) cardiac defibrillator: Secondary | ICD-10-CM

## 2018-02-04 DIAGNOSIS — I428 Other cardiomyopathies: Secondary | ICD-10-CM | POA: Diagnosis not present

## 2018-02-04 DIAGNOSIS — I5032 Chronic diastolic (congestive) heart failure: Secondary | ICD-10-CM

## 2018-02-04 NOTE — Progress Notes (Signed)
EPIC Encounter for ICM Monitoring  Patient Name: Morgan Clay is a 72 y.o. female Date: 02/04/2018 Primary Care Physican: [name], DO Primary [place] Electrophysiologist: Caryl Comes Nephrologist: [name] Dry Weight:149lbs  Bi-V Pacing: 100%      Heart Failure questions reviewed, pt reported symptoms of tiredness and SOB have improved.  She thinks this is in relation to the improvement in Anemia.    Thoracic impedance abnormal suggesting fluid accumulation but showing improvement.  Prescribed: Furosemide 40 mg take 0.5tablet(20 mg total)daily. Metolazone 2.5 mg weekly for swelling/wt gain and she takes it every Monday.      Labs: 01/18/2018 Hgb 9.2 01/10/2018 Creatinine 2.50, BUN 43, Potassium 3.8, Sodium 139 01/05/2018 Creatinine 2.83, BUN 70, Potassium 4.2, Sodium 137, EGFR 16-18  01/04/2018 Creatinine 2.84, BUN 88, Potassium 3.9, Sodium 138, EGFR 16-18  01/03/2018 Creatinine 3.16, BUN 96, Potassium 4.5, Sodium 138, EGFR 14-16  12/31/2017 Creatinine 3.48, BUN 99, Potassium 4.8, Sodium 138, EGFR 13-15  12/24/2017 Creatinine 3.36, BUN 72, Potassium 4.0, Sodium 136, EGFR 13-15  12/12/2017 Creatinine 2.84, BUN 52, Potassium 3.8, Sodium 139  11/23/2017 Creatinine2.36, BUN58, Potassium4.2, Sodium140, EGFR20-23 11/08/2017 Creatinine2.13, BUN44, Potassium3.6, [id number], [id number]  11/07/2017 Creatinine2.10, BUN52, Potassium4.0, Sodium141, [id number]  11/06/2017 Creatinine2.07, BUN51, Potassium5.3, [id number], [id number] 10/26/2017 Creatinine2.11, BUN52, Potassium4.6, Sodium141 10/25/2017 Creatinine2.70, BUN77, Potassium4.7, Sodium139  08/30/2017 Creatinine1.96, BUN28, Potassium4.2, Sodium141, FFMB84.66  08/17/2017 Creatinine1.62, BUN34, Potassium4.1, Sodium136  08/16/2017 Creatinine1.69, BUN34, Potassium3.3, [id number]  08/15/2017 Creatinine1.69, BUN38, Potassium3.2, Sodium136  08/14/2017 Creatinine1.80,  BUN38, Potassium4.3, [id number]  08/12/2017 Creatinine2.31, BUN41, Potassium4.7, Sodium135 A complete set of results can be found in Results Review.  Recommendations: No changes.   Encouraged to call for fluid symptoms.  Follow-up plan: ICM clinic phone appointment on 03/25/2018.   Office appointment scheduled 02/08/2018 with Dr. [name] (1st CHF clinic visit) and 02/20/2018 with Dr Caryl Comes.    Copy of ICM check sent to Dr. Caryl Comes.   3 month ICM trend: 02/04/2018    1 Year ICM trend:       [name], RN 02/04/2018 2:22 PM

## 2018-02-05 ENCOUNTER — Ambulatory Visit: Payer: Self-pay | Admitting: Family Medicine

## 2018-02-05 ENCOUNTER — Telehealth: Payer: Self-pay

## 2018-02-05 DIAGNOSIS — M79604 Pain in right leg: Secondary | ICD-10-CM | POA: Diagnosis not present

## 2018-02-05 DIAGNOSIS — M25561 Pain in right knee: Secondary | ICD-10-CM

## 2018-02-05 DIAGNOSIS — M10062 Idiopathic gout, left knee: Secondary | ICD-10-CM | POA: Diagnosis not present

## 2018-02-05 NOTE — Progress Notes (Signed)
Remote ICD transmission.   

## 2018-02-05 NOTE — Telephone Encounter (Signed)
Noted  

## 2018-02-05 NOTE — Addendum Note (Signed)
Addended by: Wynn Banker H on: 02/05/2018 12:58 PM   Modules accepted: Orders

## 2018-02-05 NOTE — Telephone Encounter (Signed)
Returned patient call as requested by voice mail message.  Patient reported waking up with one leg swollen and knee pain.  The swelling is not bilateral.  From cardiac stand point, nothing has changed since speaking with her on 9/30.  Advised to call PCP for follow up on knee pain and swelling.  She said she is waiting on call back from PCP this morning.

## 2018-02-05 NOTE — Telephone Encounter (Signed)
Dr Veverly Fells needs xray and last progress note fax to [phone]. Pt is at office now    Referral placed. Requested info faxed. No xrays in chart of Right knee.

## 2018-02-05 NOTE — Telephone Encounter (Signed)
Spoke to patient - Per Dr [name], her recommendation is for her to go to [facility] - they open at 1 - I told her to get there early and explained that they could do more for her.  She voiced understanding and states that she would go.

## 2018-02-05 NOTE — Telephone Encounter (Signed)
Pt c/o severe right knee and right foot pain. Pt stated that her knee is a as big as a "basketball." pt is unable to bear any weight on leg.pt stated her right foot is swollen as well. Denies right calf pain or warmth. Advised pt that she needs further evaluation at the ED. Pt refusing to go. Called FC at PCP office. During call pt hung up. Office stated that they will call her to make an appointment.   Reason for Disposition . Unable to walk  Answer Assessment - Initial Assessment Questions 1. ONSET: "When did the pain start?"      Mid September 2. LOCATION: "Where is the pain located?"      Knee and foot right 3. PAIN: "How bad is the pain?"    (Scale 1-10; or mild, moderate, severe)   -  MILD (1-3): doesn't interfere with normal activities    -  MODERATE (4-7): interferes with normal activities (e.g., work or school) or awakens from sleep, limping    -  SEVERE (8-10): excruciating pain, unable to do any normal activities, unable to walk     severe 4. WORK OR EXERCISE: "Has there been any recent work or exercise that involved this part of the body?"      no 5. CAUSE: "What do you think is causing the leg pain?"     Pt doesn't know ? arthritis 6. OTHER SYMPTOMS: "Do you have any other symptoms?" (e.g., chest pain, back pain, breathing difficulty, swelling, rash, fever, numbness, weakness)     Knee and foot and swollen and on the side  7. PREGNANCY: "Is there any chance you are pregnant?" "When was your last menstrual period?"    n/a  Protocols used: LEG PAIN-A-AH

## 2018-02-06 LAB — CUP PACEART REMOTE DEVICE CHECK
Brady Statistic AP VP Percent: 94.83 %
Brady Statistic AS VP Percent: 5.16 %
Brady Statistic AS VS Percent: 0 %
Brady Statistic RV Percent Paced: 99.95 %
Date Time Interrogation Session: 20190930083824
HIGH POWER IMPEDANCE MEASURED VALUE: 38 Ohm
HIGH POWER IMPEDANCE MEASURED VALUE: 61 Ohm
Implantable Lead Implant Date: 20080818
Implantable Lead Location: 753858
Implantable Lead Location: 753859
Implantable Lead Model: 6947
Lead Channel Impedance Value: 266 Ohm
Lead Channel Impedance Value: 323 Ohm
Lead Channel Impedance Value: 323 Ohm
Lead Channel Impedance Value: 4047 Ohm
Lead Channel Impedance Value: 4047 Ohm
Lead Channel Pacing Threshold Amplitude: 0.875 V
Lead Channel Pacing Threshold Amplitude: 1.25 V
Lead Channel Pacing Threshold Pulse Width: 0.4 ms
Lead Channel Pacing Threshold Pulse Width: 0.4 ms
Lead Channel Sensing Intrinsic Amplitude: 9.625 mV
Lead Channel Setting Pacing Amplitude: 1.75 V
Lead Channel Setting Pacing Amplitude: 2.5 V
Lead Channel Setting Pacing Pulse Width: 0.4 ms
Lead Channel Setting Pacing Pulse Width: 0.4 ms
Lead Channel Setting Sensing Sensitivity: 0.3 mV
MDC IDC LEAD IMPLANT DT: 20080818
MDC IDC LEAD IMPLANT DT: 20080818
MDC IDC LEAD LOCATION: 753860
MDC IDC MSMT BATTERY REMAINING LONGEVITY: 61 mo
MDC IDC MSMT BATTERY VOLTAGE: 2.98 V
MDC IDC MSMT LEADCHNL RA IMPEDANCE VALUE: 456 Ohm
MDC IDC MSMT LEADCHNL RA SENSING INTR AMPL: 3.25 mV
MDC IDC MSMT LEADCHNL RA SENSING INTR AMPL: 3.25 mV
MDC IDC MSMT LEADCHNL RV PACING THRESHOLD AMPLITUDE: 1 V
MDC IDC MSMT LEADCHNL RV PACING THRESHOLD PULSEWIDTH: 0.4 ms
MDC IDC MSMT LEADCHNL RV SENSING INTR AMPL: 9.625 mV
MDC IDC PG IMPLANT DT: 20170531
MDC IDC SET LEADCHNL LV PACING AMPLITUDE: 1.75 V
MDC IDC STAT BRADY AP VS PERCENT: 0.01 %
MDC IDC STAT BRADY RA PERCENT PACED: 94.81 %

## 2018-02-08 ENCOUNTER — Other Ambulatory Visit: Payer: Medicare Other

## 2018-02-08 ENCOUNTER — Ambulatory Visit (HOSPITAL_COMMUNITY)
Admission: RE | Admit: 2018-02-08 | Discharge: 2018-02-08 | Disposition: A | Discharge status: Home / Self Care | Payer: Medicare Other | Source: Ambulatory Visit | Attending: Cardiology | Admitting: Cardiology

## 2018-02-08 ENCOUNTER — Encounter (HOSPITAL_COMMUNITY): Payer: Self-pay | Admitting: Cardiology

## 2018-02-08 ENCOUNTER — Ambulatory Visit: Payer: Medicare Other

## 2018-02-08 ENCOUNTER — Other Ambulatory Visit (HOSPITAL_COMMUNITY): Payer: Self-pay | Admitting: *Deleted

## 2018-02-08 ENCOUNTER — Other Ambulatory Visit: Payer: Self-pay

## 2018-02-08 VITALS — BP 126/59 | HR 60 | Wt 152.1 lb

## 2018-02-08 DIAGNOSIS — N183 Chronic kidney disease, stage 3 (moderate): Secondary | ICD-10-CM | POA: Insufficient documentation

## 2018-02-08 DIAGNOSIS — Z79899 Other long term (current) drug therapy: Secondary | ICD-10-CM | POA: Insufficient documentation

## 2018-02-08 DIAGNOSIS — F329 Major depressive disorder, single episode, unspecified: Secondary | ICD-10-CM | POA: Insufficient documentation

## 2018-02-08 DIAGNOSIS — E669 Obesity, unspecified: Secondary | ICD-10-CM | POA: Diagnosis not present

## 2018-02-08 DIAGNOSIS — Z823 Family history of stroke: Secondary | ICD-10-CM | POA: Diagnosis not present

## 2018-02-08 DIAGNOSIS — Z801 Family history of malignant neoplasm of trachea, bronchus and lung: Secondary | ICD-10-CM | POA: Diagnosis not present

## 2018-02-08 DIAGNOSIS — I428 Other cardiomyopathies: Secondary | ICD-10-CM

## 2018-02-08 DIAGNOSIS — Z79890 Hormone replacement therapy (postmenopausal): Secondary | ICD-10-CM | POA: Diagnosis not present

## 2018-02-08 DIAGNOSIS — Z825 Family history of asthma and other chronic lower respiratory diseases: Secondary | ICD-10-CM | POA: Insufficient documentation

## 2018-02-08 DIAGNOSIS — Z95810 Presence of automatic (implantable) cardiac defibrillator: Secondary | ICD-10-CM | POA: Insufficient documentation

## 2018-02-08 DIAGNOSIS — I484 Atypical atrial flutter: Secondary | ICD-10-CM

## 2018-02-08 DIAGNOSIS — Z8719 Personal history of other diseases of the digestive system: Secondary | ICD-10-CM | POA: Diagnosis not present

## 2018-02-08 DIAGNOSIS — Z8249 Family history of ischemic heart disease and other diseases of the circulatory system: Secondary | ICD-10-CM | POA: Insufficient documentation

## 2018-02-08 DIAGNOSIS — I5032 Chronic diastolic (congestive) heart failure: Secondary | ICD-10-CM | POA: Diagnosis not present

## 2018-02-08 DIAGNOSIS — I130 Hypertensive heart and chronic kidney disease with heart failure and stage 1 through stage 4 chronic kidney disease, or unspecified chronic kidney disease: Secondary | ICD-10-CM | POA: Insufficient documentation

## 2018-02-08 DIAGNOSIS — E1122 Type 2 diabetes mellitus with diabetic chronic kidney disease: Secondary | ICD-10-CM | POA: Insufficient documentation

## 2018-02-08 DIAGNOSIS — G4733 Obstructive sleep apnea (adult) (pediatric): Secondary | ICD-10-CM | POA: Diagnosis not present

## 2018-02-08 DIAGNOSIS — Z8711 Personal history of peptic ulcer disease: Secondary | ICD-10-CM | POA: Insufficient documentation

## 2018-02-08 DIAGNOSIS — M109 Gout, unspecified: Secondary | ICD-10-CM | POA: Diagnosis not present

## 2018-02-08 LAB — COMPREHENSIVE METABOLIC PANEL
ALT: 18 U/L (ref 0–44)
ANION GAP: 11 (ref 5–15)
AST: 21 U/L (ref 15–41)
Albumin: 4.1 g/dL (ref 3.5–5.0)
Alkaline Phosphatase: 60 U/L (ref 38–126)
BUN: 55 mg/dL — ABNORMAL HIGH (ref 8–23)
CALCIUM: 9.6 mg/dL (ref 8.9–10.3)
CHLORIDE: 106 mmol/L (ref 98–111)
CO2: 22 mmol/L (ref 22–32)
CREATININE: 2.61 mg/dL — AB (ref 0.44–1.00)
GFR calc Af Amer: 20 mL/min — ABNORMAL LOW (ref >60)
GFR, EST NON AFRICAN AMERICAN: 17 mL/min — AB (ref >60)
Glucose, Bld: 160 mg/dL — ABNORMAL HIGH (ref 70–99)
Potassium: 3.8 mmol/L (ref 3.5–5.1)
SODIUM: 139 mmol/L (ref 135–145)
Total Bilirubin: 0.5 mg/dL (ref 0.3–1.2)
Total Protein: 8.3 g/dL — ABNORMAL HIGH (ref 6.5–8.1)

## 2018-02-08 LAB — CBC
HCT: 37.5 % (ref 36.0–46.0)
Hemoglobin: 11.3 g/dL — ABNORMAL LOW (ref 12.0–15.0)
MCH: 28 pg (ref 26.0–34.0)
MCHC: 30.1 g/dL (ref 30.0–36.0)
MCV: 93.1 fL (ref 78.0–100.0)
Platelets: 205 10*3/uL (ref 150–400)
RBC: 4.03 MIL/uL (ref 3.87–5.11)
RDW: 18.8 % — ABNORMAL HIGH (ref 11.5–15.5)
WBC: 9.2 10*3/uL (ref 4.0–10.5)

## 2018-02-08 LAB — PROTIME-INR
INR: 1.26
Prothrombin Time: 15.7 seconds — ABNORMAL HIGH (ref 11.4–15.2)

## 2018-02-08 LAB — TSH: TSH: 4.849 u[IU]/mL — ABNORMAL HIGH (ref 0.350–4.500)

## 2018-02-08 MED ORDER — POTASSIUM CHLORIDE ER 20 MEQ PO TBCR: 20.0000 meq | EXTENDED_RELEASE_TABLET | Freq: Every day | ORAL | 3 refills | Status: DC

## 2018-02-08 MED ORDER — TORSEMIDE 20 MG PO TABS: 40.0000 mg | ORAL_TABLET | Freq: Every day | ORAL | 3 refills | Status: DC

## 2018-02-08 NOTE — Patient Instructions (Signed)
Today you have been seen at the Heart failure clinic at [facility]   Medication changes: Potassium 20 mEq daily STOP lasix STOP metolazone Start torsemide 40mg  in the morning with 20mg  in the after noon for 5 days then 40 mg daily after that.  Lab work done today: CMET, TSH. CBC, INR   Follow up with NP/PA in 3 weeks Follow up with Dr. [name] in 6 weeks  You have the following test scheduled: Echo    Your physician has requested that you have a cardiac catheterization. Cardiac catheterization is used to diagnose and/or treat various heart conditions. Doctors may recommend this procedure for a number of different reasons. The most common reason is to evaluate chest pain. Chest pain can be a symptom of coronary artery disease (CAD), and cardiac catheterization can show whether plaque is narrowing or blocking your heart's arteries. This procedure is also used to evaluate the valves, as well as measure the blood flow and oxygen levels in different parts of your heart. For further information please visit [email or web address].[email or web address]. Please follow instruction sheet, as given.  Your physician has requested that you have an echocardiogram. Echocardiography is a painless test that uses sound waves to create images of your heart. It provides your doctor with information about the size and shape of your heart and how well your heart's chambers and valves are working. This procedure takes approximately one hour. There are no restrictions for this procedure.    [facility] [place] [id number] [place] [id number] Dept: [id number] Loc: [phone]  Morgan Clay  02/08/2018  You are scheduled for a Cardiac Catheterization on Wednesday, October 9 with Dr. Loralie Champagne.  1. Please arrive at the [facility] (Main Entrance A) at [facility]: [address] [place], [place] [address] at 6:30 AM (This  time is two hours before your procedure to ensure your preparation). Free valet parking service is available.   Special note: Every effort is made to have your procedure done on time. Please understand that emergencies sometimes delay scheduled procedures.  2. Diet: Do not eat solid foods after midnight.  The patient may have clear liquids until 5am upon the day of the procedure.  3. Labs: You will need to have blood drawn on Friday, October 4 at [facility]. You do not need to be fasting.  4. Medication instructions in preparation for your procedure:   Contrast Allergy: No  On the morning of your procedure, take your 81 mg baby asprin and any morning medicines NOT listed above.  You may use sips of water.  5. Plan for one night stay--bring personal belongings. 6. Bring a current list of your medications and current insurance cards. 7. You MUST have a responsible person to drive you home. 8. Someone MUST be with you the first 24 hours after you arrive home or your discharge will be delayed. 9. Please wear clothes that are easy to get on and off and wear slip-on shoes.  Thank you for allowing [name] to care for you!   -- [facility]

## 2018-02-10 NOTE — Progress Notes (Signed)
PCP: Dr. [name] Cardiology: Dr. Caryl Comes HF Cardiology: Dr. [name]  72 yo with history of nonischemic cardiomyopathy with recovery of EF with BiV pacing, recurrent GI bleeding, and paroxysmal atrial flutter was referred by Dr. Caryl Comes for evaluation of CHF.  Patient has a history of presumed nonischemic cardiomyopathy for a number of years.  Echo in 12/15 showed EF 25-30% and Cardiolite at that time showed no ischemia.  She had Medtronic CRT-D system placed, and EF recovered. In 4/19, echo showed EF 60-65%.  Despite recovery of LV systolic function, she has remained short of breath with exertion.  This has been worse for a number of months now.  She has gained 10 lbs recently.  She has orthopnea, sleeping on 4 pillows. She is short of breath walking short distances around her house.  She has abdominal swelling.  Occasional PND. Occasional lightheadedness if she stands too fast.  Her chest will feel heavy with exertion (when she gets short of breath).  No syncope.  I checked her CRT-D device today.  She is BiV pacing appropriately (>99% of the time) but has volume overload by impedance monitoring.   She has additionally had problems with GI bleeding.  She had been on Eliquis for atrial flutter but this was stopped with recurrent GI bleeding.  She had bleeding from a gastric ulcer but was also noted to have multiple small bowel AVMs on capsule endoscopy out of reach of endoscope.  Eliquis was stopped. She has had no melena or BRBPR off Eliquis.   ECG (7/19): NSR with BiV pacing (personally reviewed)  Labs (9/19):  Hgb 10.2, K 3.8, creatinine 2.5  Medtronic device interrogation: >99% BiV pacing, fluid index > threshold with low thoracic impedance, no AT/AF, no VT.   PMH: 1. Diabetes: diet-controlled.  2. Gout 3. HCV: Treated with Harvoni.  4. Atrial flutter: Atypical.  Had DCCV in 3/19, maintained on amiodarone.  5. HTN 6. CKD stage 3: Sees nephrology, [name].  7. H/o SBO 8. Obesity 9. Nonischemic  cardiomyopathy: Echo in 12/15 with EF 25-30%.  Cardiolite in 12/15 with no ischemia.  She had a Medtronic CRT-D device placed with recovery of EF.   - Echo (4/19): EF 60-65% with moderate AI, moderate MR, and PASP 49 mmHg.  10. GI bleeding: Gastric AVM and also gastric ulcer seen in past.  - 8/19 capsule endoscopy showed small bowel AVMs beyond the reach of endoscopy.  - With recurrent episodes of GI bleeding, Eliquis was stopped.  11. Fe deficiency anemia.  12. Depression 13. OSA: Uses CPAP.   Social History   Socioeconomic History  . Marital status: Divorced    Spouse name: Not on file  . Number of children: Not on file  . Years of education: Not on file  . Highest education level: Not on file  Occupational History  . Not on file  Social Needs  . Financial resource strain: Not on file  . Food insecurity:    Worry: Not on file    Inability: Not on file  . Transportation needs:    Medical: Not on file    Non-medical: Not on file  Tobacco Use  . Smoking status: Never Smoker  . Smokeless tobacco: Never Used  Substance and Sexual Activity  . Alcohol use: Yes    Comment: 01/03/2018 "couple drinks/month"  . Drug use: No  . Sexual activity: Not Currently  Lifestyle  . Physical activity:    Days per week: Not on file    Minutes  per session: Not on file  . Stress: Not on file  Relationships  . Social connections:    Talks on phone: Not on file    Gets together: Not on file    Attends religious service: Not on file    Active member of club or organization: Not on file    Attends meetings of clubs or organizations: Not on file    Relationship status: Not on file  . Intimate partner violence:    Fear of current or ex partner: Not on file    Emotionally abused: Not on file    Physically abused: Not on file    Forced sexual activity: Not on file  Other Topics Concern  . 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 Asthma Father   . Heart attack Father   . Asthma Sister   . Lung cancer Sister   . Heart attack Mother   . Stroke Brother   . Colon cancer Neg Hx   . Esophageal cancer Neg Hx   . Pancreatic cancer Neg Hx   . Stomach cancer Neg Hx   . Liver disease Neg Hx    ROS: All systems reviewed and negative except as per HPI.   Current Outpatient Medications  Medication Sig Dispense Refill  . albuterol (PROVENTIL HFA;VENTOLIN HFA) 108 (90 Base) MCG/ACT inhaler Inhale 2 puffs into the lungs every 6 (six) hours as needed for wheezing or shortness of breath. 1 Inhaler 3  . allopurinol (ZYLOPRIM) 100 MG tablet Take 1/2 tablet by mouth daily (Patient taking differently: Take 50 mg by mouth daily. ) 15 tablet 1  . amiodarone (PACERONE) 200 MG tablet Take 200 mg by mouth at bedtime.     [name] BIDIL 20-37.5 MG tablet TAKE 1 TABLET BY MOUTH TWICE DAILY (Patient taking differently: Take 1 tablet by mouth 2 (two) times daily. ) 180 tablet 3  . carvedilol (COREG) 25 MG tablet Take 25 mg by mouth 2 (two) times daily.  3  . levothyroxine (SYNTHROID, LEVOTHROID) 125 MCG tablet Take 125 mcg by mouth daily before breakfast.    . nitroGLYCERIN (NITROSTAT) 0.4 MG SL tablet Place 0.4 mg under the tongue every 5 (five) minutes as needed for chest pain.    . pantoprazole (PROTONIX) 40 MG tablet Take 1 tablet (40 mg total) by mouth daily. 30 tablet 4  . polyethylene glycol (MIRALAX / GLYCOLAX) packet Take 17 g by mouth 2 (two) times daily. (Patient not taking: Reported on 02/08/2018) 60 each 2  . sertraline (ZOLOFT) 50 MG tablet Take 1.5 tablets (75 mg total) by mouth daily. 135 tablet 3  . spironolactone (ALDACTONE) 25 MG tablet Take 1  tablet (25 mg total) by mouth daily. Hold until after you repeat BMP blood work on 01/07/2018 10 tablet 0  . colchicine 0.6 MG tablet Take 0.6 mg by mouth daily as needed. Gout flare ups  0  . cyanocobalamin (,VITAMIN B-12,) 1000 MCG/ML injection Inject 1,000 mcg into the muscle  every 30 (thirty) days.    . potassium chloride 20 MEQ TBCR Take 20 mEq by mouth daily. 30 tablet 3  . torsemide (DEMADEX) 20 MG tablet Take 2 tablets (40 mg total) by mouth daily. 90 tablet 3   No current facility-administered medications for this encounter.    BP (!) 126/59   Pulse 60   Wt 69 kg (152 lb 2 oz)   SpO2 100%   BMI 30.73 kg/m  General: NAD Neck: JVP 14 cm, no thyromegaly or thyroid nodule.  Lungs: Clear to auscultation bilaterally with normal respiratory effort. CV: Nondisplaced PMI.  Heart regular S1/S2, no S3/S4, no murmur.  No peripheral edema.  No carotid bruit.  Normal pedal pulses.  Abdomen: Soft, nontender, no hepatosplenomegaly, mild abdominal distention.  Skin: Intact without lesions or rashes.  Neurologic: Alert and oriented x 3.  Psych: Normal affect. Extremities: No clubbing or cyanosis.  HEENT: Normal.   Assessment/Plan: 1. Chronic diastolic CHF: Patient has history of presumed nonischemic cardiomyopathy (Cardiolite with no ischemia, cath not done with CKD).  However, most recent echo in 4/19 showed EF improved to 60-65% with BiV pacing.  Medtronic CRT-D device is functioning normally on interrogation, but she is very symptomatic and volume overloaded by exam and by Optivol.  NYHA class III symptoms.   - She needs a repeat echo to reassess LV systolic function.  I will also review the echo to see if cardiac amyloidosis would be a consideration.  - She needs a right heart cath to assess filling pressures, look for pulmonary hypertension. We discussed risks/benefits of procedure and she agrees to it.  - Stop Lasix and metolazone. I will start her on torsemide 40 qam/20 qpm x 5 days then 40  qpm after that.  Will check BMET today and again in 10 days.  Can adjust torsemide as needed.  Will add KCl 20 daily.  2. Atrial flutter: History of atypical flutter, 3/19 DCCV. She is in NSR.  - Off Eliquis due to GI bleeding.  - Continues on amiodarone.  Check LFTs/TSH today, she will need a regular eye exam.  3. CKD: Stage 3, followed by [name].  Last creatinine 2.5.  - BMET today.  - Follow closely with diuresis.  4. GI bleeding: has had gastric ulcer and gastric AVMs as well as small bowel AVMs.  She is now off Eliquis. No BRBPR/melena off Eliquis.  - CBC today.   Loralie Champagne 02/10/2018

## 2018-02-10 NOTE — H&P (View-Only) (Signed)
PCP: Dr. [name] Cardiology: Dr. Caryl Comes HF Cardiology: Dr. [name]  72 yo with history of nonischemic cardiomyopathy with recovery of EF with BiV pacing, recurrent GI bleeding, and paroxysmal atrial flutter was referred by Dr. Caryl Comes for evaluation of CHF.  Patient has a history of presumed nonischemic cardiomyopathy for a number of years.  Echo in 12/15 showed EF 25-30% and Cardiolite at that time showed no ischemia.  She had Medtronic CRT-D system placed, and EF recovered. In 4/19, echo showed EF 60-65%.  Despite recovery of LV systolic function, she has remained short of breath with exertion.  This has been worse for a number of months now.  She has gained 10 lbs recently.  She has orthopnea, sleeping on 4 pillows. She is short of breath walking short distances around her house.  She has abdominal swelling.  Occasional PND. Occasional lightheadedness if she stands too fast.  Her chest will feel heavy with exertion (when she gets short of breath).  No syncope.  I checked her CRT-D device today.  She is BiV pacing appropriately (>99% of the time) but has volume overload by impedance monitoring.   She has additionally had problems with GI bleeding.  She had been on Eliquis for atrial flutter but this was stopped with recurrent GI bleeding.  She had bleeding from a gastric ulcer but was also noted to have multiple small bowel AVMs on capsule endoscopy out of reach of endoscope.  Eliquis was stopped. She has had no melena or BRBPR off Eliquis.   ECG (7/19): NSR with BiV pacing (personally reviewed)  Labs (9/19):  Hgb 10.2, K 3.8, creatinine 2.5  Medtronic device interrogation: >99% BiV pacing, fluid index > threshold with low thoracic impedance, no AT/AF, no VT.   PMH: 1. Diabetes: diet-controlled.  2. Gout 3. HCV: Treated with Harvoni.  4. Atrial flutter: Atypical.  Had DCCV in 3/19, maintained on amiodarone.  5. HTN 6. CKD stage 3: Sees nephrology, [name].  7. H/o SBO 8. Obesity 9. Nonischemic  cardiomyopathy: Echo in 12/15 with EF 25-30%.  Cardiolite in 12/15 with no ischemia.  She had a Medtronic CRT-D device placed with recovery of EF.   - Echo (4/19): EF 60-65% with moderate AI, moderate MR, and PASP 49 mmHg.  10. GI bleeding: Gastric AVM and also gastric ulcer seen in past.  - 8/19 capsule endoscopy showed small bowel AVMs beyond the reach of endoscopy.  - With recurrent episodes of GI bleeding, Eliquis was stopped.  11. Fe deficiency anemia.  12. Depression 13. OSA: Uses CPAP.   Social History   Socioeconomic History  . Marital status: Divorced    Spouse name: Not on file  . Number of children: Not on file  . Years of education: Not on file  . Highest education level: Not on file  Occupational History  . Not on file  Social Needs  . Financial resource strain: Not on file  . Food insecurity:    Worry: Not on file    Inability: Not on file  . Transportation needs:    Medical: Not on file    Non-medical: Not on file  Tobacco Use  . Smoking status: Never Smoker  . Smokeless tobacco: Never Used  Substance and Sexual Activity  . Alcohol use: Yes    Comment: 01/03/2018 "couple drinks/month"  . Drug use: No  . Sexual activity: Not Currently  Lifestyle  . Physical activity:    Days per week: Not on file    Minutes  per session: Not on file  . Stress: Not on file  Relationships  . Social connections:    Talks on phone: Not on file    Gets together: Not on file    Attends religious service: Not on file    Active member of club or organization: Not on file    Attends meetings of clubs or organizations: Not on file    Relationship status: Not on file  . Intimate partner violence:    Fear of current or ex partner: Not on file    Emotionally abused: Not on file    Physically abused: Not on file    Forced sexual activity: Not on file  Other Topics Concern  . 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 Asthma Father   . Heart attack Father   . Asthma Sister   . Lung cancer Sister   . Heart attack Mother   . Stroke Brother   . Colon cancer Neg Hx   . Esophageal cancer Neg Hx   . Pancreatic cancer Neg Hx   . Stomach cancer Neg Hx   . Liver disease Neg Hx    ROS: All systems reviewed and negative except as per HPI.   Current Outpatient Medications  Medication Sig Dispense Refill  . albuterol (PROVENTIL HFA;VENTOLIN HFA) 108 (90 Base) MCG/ACT inhaler Inhale 2 puffs into the lungs every 6 (six) hours as needed for wheezing or shortness of breath. 1 Inhaler 3  . allopurinol (ZYLOPRIM) 100 MG tablet Take 1/2 tablet by mouth daily (Patient taking differently: Take 50 mg by mouth daily. ) 15 tablet 1  . amiodarone (PACERONE) 200 MG tablet Take 200 mg by mouth at bedtime.     [name] BIDIL 20-37.5 MG tablet TAKE 1 TABLET BY MOUTH TWICE DAILY (Patient taking differently: Take 1 tablet by mouth 2 (two) times daily. ) 180 tablet 3  . carvedilol (COREG) 25 MG tablet Take 25 mg by mouth 2 (two) times daily.  3  . levothyroxine (SYNTHROID, LEVOTHROID) 125 MCG tablet Take 125 mcg by mouth daily before breakfast.    . nitroGLYCERIN (NITROSTAT) 0.4 MG SL tablet Place 0.4 mg under the tongue every 5 (five) minutes as needed for chest pain.    . pantoprazole (PROTONIX) 40 MG tablet Take 1 tablet (40 mg total) by mouth daily. 30 tablet 4  . polyethylene glycol (MIRALAX / GLYCOLAX) packet Take 17 g by mouth 2 (two) times daily. (Patient not taking: Reported on 02/08/2018) 60 each 2  . sertraline (ZOLOFT) 50 MG tablet Take 1.5 tablets (75 mg total) by mouth daily. 135 tablet 3  . spironolactone (ALDACTONE) 25 MG tablet Take 1  tablet (25 mg total) by mouth daily. Hold until after you repeat BMP blood work on 01/07/2018 10 tablet 0  . colchicine 0.6 MG tablet Take 0.6 mg by mouth daily as needed. Gout flare ups  0  . cyanocobalamin (,VITAMIN B-12,) 1000 MCG/ML injection Inject 1,000 mcg into the muscle  every 30 (thirty) days.    . potassium chloride 20 MEQ TBCR Take 20 mEq by mouth daily. 30 tablet 3  . torsemide (DEMADEX) 20 MG tablet Take 2 tablets (40 mg total) by mouth daily. 90 tablet 3   No current facility-administered medications for this encounter.    BP (!) 126/59   Pulse 60   Wt 69 kg (152 lb 2 oz)   SpO2 100%   BMI 30.73 kg/m  General: NAD Neck: JVP 14 cm, no thyromegaly or thyroid nodule.  Lungs: Clear to auscultation bilaterally with normal respiratory effort. CV: Nondisplaced PMI.  Heart regular S1/S2, no S3/S4, no murmur.  No peripheral edema.  No carotid bruit.  Normal pedal pulses.  Abdomen: Soft, nontender, no hepatosplenomegaly, mild abdominal distention.  Skin: Intact without lesions or rashes.  Neurologic: Alert and oriented x 3.  Psych: Normal affect. Extremities: No clubbing or cyanosis.  HEENT: Normal.   Assessment/Plan: 1. Chronic diastolic CHF: Patient has history of presumed nonischemic cardiomyopathy (Cardiolite with no ischemia, cath not done with CKD).  However, most recent echo in 4/19 showed EF improved to 60-65% with BiV pacing.  Medtronic CRT-D device is functioning normally on interrogation, but she is very symptomatic and volume overloaded by exam and by Optivol.  NYHA class III symptoms.   - She needs a repeat echo to reassess LV systolic function.  I will also review the echo to see if cardiac amyloidosis would be a consideration.  - She needs a right heart cath to assess filling pressures, look for pulmonary hypertension. We discussed risks/benefits of procedure and she agrees to it.  - Stop Lasix and metolazone. I will start her on torsemide 40 qam/20 qpm x 5 days then 40  qpm after that.  Will check BMET today and again in 10 days.  Can adjust torsemide as needed.  Will add KCl 20 daily.  2. Atrial flutter: History of atypical flutter, 3/19 DCCV. She is in NSR.  - Off Eliquis due to GI bleeding.  - Continues on amiodarone.  Check LFTs/TSH today, she will need a regular eye exam.  3. CKD: Stage 3, followed by [name].  Last creatinine 2.5.  - BMET today.  - Follow closely with diuresis.  4. GI bleeding: has had gastric ulcer and gastric AVMs as well as small bowel AVMs.  She is now off Eliquis. No BRBPR/melena off Eliquis.  - CBC today.   Loralie Champagne 02/10/2018

## 2018-02-11 ENCOUNTER — Ambulatory Visit: Payer: Medicare Other | Admitting: Family Medicine

## 2018-02-13 ENCOUNTER — Encounter (HOSPITAL_COMMUNITY): Payer: Self-pay | Admitting: Cardiology

## 2018-02-13 ENCOUNTER — Ambulatory Visit (HOSPITAL_COMMUNITY)
Admission: RE | Admit: 2018-02-13 | Discharge: 2018-02-13 | Disposition: A | Discharge status: Home / Self Care | Payer: Medicare Other | Source: Ambulatory Visit | Attending: Cardiology | Admitting: Cardiology

## 2018-02-13 ENCOUNTER — Encounter (HOSPITAL_COMMUNITY)
Admission: RE | Disposition: A | Discharge status: Home / Self Care | Payer: Self-pay | Source: Ambulatory Visit | Attending: Cardiology

## 2018-02-13 ENCOUNTER — Other Ambulatory Visit: Payer: Self-pay

## 2018-02-13 DIAGNOSIS — N183 Chronic kidney disease, stage 3 (moderate): Secondary | ICD-10-CM | POA: Diagnosis not present

## 2018-02-13 DIAGNOSIS — E669 Obesity, unspecified: Secondary | ICD-10-CM | POA: Diagnosis not present

## 2018-02-13 DIAGNOSIS — I5032 Chronic diastolic (congestive) heart failure: Secondary | ICD-10-CM | POA: Diagnosis not present

## 2018-02-13 DIAGNOSIS — I4892 Unspecified atrial flutter: Secondary | ICD-10-CM | POA: Insufficient documentation

## 2018-02-13 DIAGNOSIS — Z8249 Family history of ischemic heart disease and other diseases of the circulatory system: Secondary | ICD-10-CM | POA: Insufficient documentation

## 2018-02-13 DIAGNOSIS — F329 Major depressive disorder, single episode, unspecified: Secondary | ICD-10-CM | POA: Insufficient documentation

## 2018-02-13 DIAGNOSIS — I130 Hypertensive heart and chronic kidney disease with heart failure and stage 1 through stage 4 chronic kidney disease, or unspecified chronic kidney disease: Secondary | ICD-10-CM | POA: Insufficient documentation

## 2018-02-13 DIAGNOSIS — I272 Pulmonary hypertension, unspecified: Secondary | ICD-10-CM | POA: Diagnosis not present

## 2018-02-13 DIAGNOSIS — Z6830 Body mass index (BMI) 30.0-30.9, adult: Secondary | ICD-10-CM | POA: Insufficient documentation

## 2018-02-13 DIAGNOSIS — E1122 Type 2 diabetes mellitus with diabetic chronic kidney disease: Secondary | ICD-10-CM | POA: Insufficient documentation

## 2018-02-13 DIAGNOSIS — I509 Heart failure, unspecified: Secondary | ICD-10-CM | POA: Diagnosis not present

## 2018-02-13 DIAGNOSIS — Z823 Family history of stroke: Secondary | ICD-10-CM | POA: Insufficient documentation

## 2018-02-13 DIAGNOSIS — M109 Gout, unspecified: Secondary | ICD-10-CM | POA: Insufficient documentation

## 2018-02-13 DIAGNOSIS — G4733 Obstructive sleep apnea (adult) (pediatric): Secondary | ICD-10-CM | POA: Insufficient documentation

## 2018-02-13 DIAGNOSIS — Z79890 Hormone replacement therapy (postmenopausal): Secondary | ICD-10-CM | POA: Insufficient documentation

## 2018-02-13 DIAGNOSIS — Z79899 Other long term (current) drug therapy: Secondary | ICD-10-CM | POA: Diagnosis not present

## 2018-02-13 DIAGNOSIS — K254 Chronic or unspecified gastric ulcer with hemorrhage: Secondary | ICD-10-CM | POA: Diagnosis not present

## 2018-02-13 DIAGNOSIS — Z8719 Personal history of other diseases of the digestive system: Secondary | ICD-10-CM | POA: Insufficient documentation

## 2018-02-13 HISTORY — PX: RIGHT HEART CATH: CATH118263

## 2018-02-13 LAB — POCT I-STAT 3, VENOUS BLOOD GAS (G3P V)
ACID-BASE DEFICIT: 1 mmol/L (ref 0.0–2.0)
ACID-BASE DEFICIT: 2 mmol/L (ref 0.0–2.0)
BICARBONATE: 24.8 mmol/L (ref 20.0–28.0)
Bicarbonate: 24.1 mmol/L (ref 20.0–28.0)
O2 SAT: 67 %
O2 Saturation: 66 %
TCO2: 25 mmol/L (ref 22–32)
TCO2: 26 mmol/L (ref 22–32)
pCO2, Ven: 44.2 mmHg (ref 44.0–60.0)
pCO2, Ven: 45.3 mmHg (ref 44.0–60.0)
pH, Ven: 7.345 (ref 7.250–7.430)
pH, Ven: 7.347 (ref 7.250–7.430)
pO2, Ven: 36 mmHg (ref 32.0–45.0)
pO2, Ven: 37 mmHg (ref 32.0–45.0)

## 2018-02-13 LAB — POCT I-STAT, CHEM 8
BUN: 61 mg/dL — AB (ref 8–23)
CALCIUM ION: 1.25 mmol/L (ref 1.15–1.40)
CHLORIDE: 105 mmol/L (ref 98–111)
Creatinine, Ser: 2.5 mg/dL — ABNORMAL HIGH (ref 0.44–1.00)
GLUCOSE: 108 mg/dL — AB (ref 70–99)
HCT: 46 % (ref 36.0–46.0)
Hemoglobin: 15.6 g/dL — ABNORMAL HIGH (ref 12.0–15.0)
Potassium: 3.7 mmol/L (ref 3.5–5.1)
SODIUM: 140 mmol/L (ref 135–145)
TCO2: 24 mmol/L (ref 22–32)

## 2018-02-13 LAB — GLUCOSE, CAPILLARY: GLUCOSE-CAPILLARY: 103 mg/dL — AB (ref 70–99)

## 2018-02-13 SURGERY — RIGHT HEART CATH
Anesthesia: LOCAL

## 2018-02-13 MED ORDER — FENTANYL CITRATE (PF) 100 MCG/2ML IJ SOLN
INTRAMUSCULAR | Status: DC | PRN
  Administered 2018-02-13: 25 ug via INTRAVENOUS

## 2018-02-13 MED ORDER — SODIUM CHLORIDE 0.9 % IV SOLN
INTRAVENOUS | Status: DC
  Administered 2018-02-13: 10 mL/h via INTRAVENOUS

## 2018-02-13 MED ORDER — SODIUM CHLORIDE 0.9% FLUSH: 3.0000 mL | Freq: Two times a day (BID) | INTRAVENOUS | Status: DC

## 2018-02-13 MED ORDER — LIDOCAINE HCL (PF) 1 % IJ SOLN
INTRAMUSCULAR | Status: DC | PRN
  Administered 2018-02-13: 1 mL

## 2018-02-13 MED ORDER — SODIUM CHLORIDE 0.9% FLUSH: 3.0000 mL | INTRAVENOUS | Status: DC | PRN

## 2018-02-13 MED ORDER — ACETAMINOPHEN 325 MG PO TABS: 650.0000 mg | ORAL_TABLET | ORAL | Status: DC | PRN

## 2018-02-13 MED ORDER — ASPIRIN 81 MG PO CHEW
81.0000 mg | CHEWABLE_TABLET | ORAL | Status: AC
  Administered 2018-02-13: 81 mg via ORAL
  Filled 2018-02-13: qty 1

## 2018-02-13 MED ORDER — LIDOCAINE HCL (PF) 1 % IJ SOLN
INTRAMUSCULAR | Status: AC
  Filled 2018-02-13: qty 30

## 2018-02-13 MED ORDER — ONDANSETRON HCL 4 MG/2ML IJ SOLN: 4.0000 mg | Freq: Four times a day (QID) | INTRAMUSCULAR | Status: DC | PRN

## 2018-02-13 MED ORDER — HEPARIN (PORCINE) IN NACL 1000-0.9 UT/500ML-% IV SOLN
INTRAVENOUS | Status: DC | PRN
  Administered 2018-02-13: 500 mL

## 2018-02-13 MED ORDER — SODIUM CHLORIDE 0.9 % IV SOLN: 250.0000 mL | INTRAVENOUS | Status: DC | PRN

## 2018-02-13 MED ORDER — HEPARIN (PORCINE) IN NACL 1000-0.9 UT/500ML-% IV SOLN
INTRAVENOUS | Status: AC
  Filled 2018-02-13: qty 500

## 2018-02-13 MED ORDER — FENTANYL CITRATE (PF) 100 MCG/2ML IJ SOLN
INTRAMUSCULAR | Status: AC
  Filled 2018-02-13: qty 2

## 2018-02-13 MED ORDER — MIDAZOLAM HCL 2 MG/2ML IJ SOLN
INTRAMUSCULAR | Status: DC | PRN
  Administered 2018-02-13: 1 mg via INTRAVENOUS

## 2018-02-13 MED ORDER — MIDAZOLAM HCL 2 MG/2ML IJ SOLN
INTRAMUSCULAR | Status: AC
  Filled 2018-02-13: qty 2

## 2018-02-13 SURGICAL SUPPLY — 9 items
CATH BALLN WEDGE 5F 110CM (CATHETERS) ×2 IMPLANT
GUIDEWIRE .025 260CM (WIRE) ×2 IMPLANT
PACK CARDIAC CATHETERIZATION (CUSTOM PROCEDURE TRAY) ×2 IMPLANT
PROTECTION STATION PRESSURIZED (MISCELLANEOUS) ×2
SHEATH GLIDE SLENDER 4/5FR (SHEATH) ×2 IMPLANT
STATION PROTECTION PRESSURIZED (MISCELLANEOUS) ×1 IMPLANT
TRANSDUCER W/STOPCOCK (MISCELLANEOUS) ×2 IMPLANT
TUBING ART PRESS 72  MALE/FEM (TUBING) ×1
TUBING ART PRESS 72 MALE/FEM (TUBING) ×1 IMPLANT

## 2018-02-13 NOTE — Interval H&P Note (Signed)
History and Physical Interval Note:  02/13/2018 8:30 AM  Morgan Clay  has presented today for surgery, with the diagnosis of hf  The various methods of treatment have been discussed with the patient and family. After consideration of risks, benefits and other options for treatment, the patient has consented to  Procedure(s): RIGHT HEART CATH (N/A) as a surgical intervention .  The patient's history has been reviewed, patient examined, no change in status, stable for surgery.  I have reviewed the patient's chart and labs.  Questions were answered to the patient's satisfaction.     Viviann Broyles [employer]

## 2018-02-13 NOTE — Discharge Instructions (Signed)
Venogram, Care After °This sheet gives you information about how to care for yourself after your procedure. Your health care provider may also give you more specific instructions. If you have problems or questions, contact your health care provider. °What can I expect after the procedure? °After the procedure, it is common to have: °· Bruising or mild discomfort in the area where the IV was inserted (insertion site). ° °Follow these instructions at home: °Eating and drinking °· Follow instructions from your health care provider about eating or drinking restrictions. °· Drink a lot of fluids for the first several days after the procedure, as directed by your health care provider. This helps to wash (flush) the contrast out of your body. Examples of healthy fluids include water or low-calorie drinks. °General instructions °· Check your IV insertion area every day for signs of infection. Check for: °? Redness, swelling, or pain. °? Fluid or blood. °? Warmth. °? Pus or a bad smell. °· Take over-the-counter and prescription medicines only as told by your health care provider. °· Rest and return to your normal activities as told by your health care provider. Ask your health care provider what activities are safe for you. °· Do not drive for 24 hours if you were given a medicine to help you relax (sedative), or until your health care provider approves. °· Keep all follow-up visits as told by your health care provider. This is important. °Contact a health care provider if: °· Your skin becomes itchy or you develop a rash or hives. °· You have a fever that does not get better with medicine. °· You feel nauseous. °· You vomit. °· You have redness, swelling, or pain around the insertion site. °· You have fluid or blood coming from the insertion site. °· Your insertion area feels warm to the touch. °· You have pus or a bad smell coming from the insertion site. °Get help right away if: °· You have difficulty breathing or  shortness of breath. °· You develop chest pain. °· You faint. °· You feel very dizzy. °These symptoms may represent a serious problem that is an emergency. Do not wait to see if the symptoms will go away. Get medical help right away. Call your local emergency services (911 in the [place] Do not drive yourself to the hospital. °Summary °· After your procedure, it is common to have bruising or mild discomfort in the area where the IV was inserted. °· You should check your IV insertion area every day for signs of infection. °· Take over-the-counter and prescription medicines only as told by your health care provider. °· You should drink a lot of fluids for the first several days after the procedure to help flush the contrast from your body. °This information is not intended to replace advice given to you by your health care provider. Make sure you discuss any questions you have with your health care provider. °Document Released: 02/12/2013 Document Revised: 03/18/2016 Document Reviewed: 03/18/2016 °Elsevier Interactive Patient Education © 2017 [employer] ° °

## 2018-02-14 ENCOUNTER — Other Ambulatory Visit: Payer: Self-pay | Admitting: *Deleted

## 2018-02-14 DIAGNOSIS — N183 Chronic kidney disease, stage 3 (moderate): Secondary | ICD-10-CM | POA: Diagnosis not present

## 2018-02-14 DIAGNOSIS — N189 Chronic kidney disease, unspecified: Principal | ICD-10-CM

## 2018-02-14 DIAGNOSIS — D631 Anemia in chronic kidney disease: Secondary | ICD-10-CM

## 2018-02-14 DIAGNOSIS — I129 Hypertensive chronic kidney disease with stage 1 through stage 4 chronic kidney disease, or unspecified chronic kidney disease: Secondary | ICD-10-CM | POA: Diagnosis not present

## 2018-02-14 DIAGNOSIS — M109 Gout, unspecified: Secondary | ICD-10-CM | POA: Diagnosis not present

## 2018-02-14 DIAGNOSIS — R809 Proteinuria, unspecified: Secondary | ICD-10-CM | POA: Diagnosis not present

## 2018-02-15 ENCOUNTER — Inpatient Hospital Stay: Payer: Medicare Other | Attending: Oncology

## 2018-02-15 ENCOUNTER — Inpatient Hospital Stay: Payer: Medicare Other

## 2018-02-15 DIAGNOSIS — D631 Anemia in chronic kidney disease: Secondary | ICD-10-CM

## 2018-02-15 DIAGNOSIS — N189 Chronic kidney disease, unspecified: Secondary | ICD-10-CM

## 2018-02-15 DIAGNOSIS — D649 Anemia, unspecified: Secondary | ICD-10-CM | POA: Insufficient documentation

## 2018-02-15 LAB — CBC WITH DIFFERENTIAL (CANCER CENTER ONLY)
Abs Immature Granulocytes: 0.06 10*3/uL (ref 0.00–0.07)
BASOS ABS: 0 10*3/uL (ref 0.0–0.1)
BASOS PCT: 1 %
EOS ABS: 0.2 10*3/uL (ref 0.0–0.5)
Eosinophils Relative: 2 %
HCT: 43 % (ref 36.0–46.0)
Hemoglobin: 13.4 g/dL (ref 12.0–15.0)
IMMATURE GRANULOCYTES: 1 %
Lymphocytes Relative: 12 %
Lymphs Abs: 0.9 10*3/uL (ref 0.7–4.0)
MCH: 27.3 pg (ref 26.0–34.0)
MCHC: 31.2 g/dL (ref 30.0–36.0)
MCV: 87.8 fL (ref 80.0–100.0)
Monocytes Absolute: 0.5 10*3/uL (ref 0.1–1.0)
Monocytes Relative: 7 %
NEUTROS ABS: 5.6 10*3/uL (ref 1.7–7.7)
NEUTROS PCT: 77 %
Platelet Count: 244 10*3/uL (ref 150–400)
RBC: 4.9 MIL/uL (ref 3.87–5.11)
RDW: 18.5 % — AB (ref 11.5–15.5)
WBC: 7.2 10*3/uL (ref 4.0–10.5)
nRBC: 0 % (ref 0.0–0.2)

## 2018-02-15 MED ORDER — DARBEPOETIN ALFA 300 MCG/0.6ML IJ SOSY
PREFILLED_SYRINGE | INTRAMUSCULAR | Status: AC
  Filled 2018-02-15: qty 0.6

## 2018-02-15 NOTE — Progress Notes (Signed)
Patient did not parameters for Aranesp injection. Hgb 13.4. Labs results were given to patient in lobby and will return for next appt. Porsche Cates LPN

## 2018-02-20 ENCOUNTER — Encounter: Payer: Medicare Other | Admitting: Internal Medicine

## 2018-02-21 ENCOUNTER — Telehealth: Payer: Self-pay | Admitting: Family Medicine

## 2018-02-21 NOTE — Telephone Encounter (Signed)
Copied from [place] [id number]. Topic: Quick Communication - See Telephone Encounter >> Feb 21, 2018  1:58 PM [place], [place] wrote: CRM for notification. See Telephone encounter for: 02/21/18. Lauren, RN Case Manager with [facility] calling and states that the patient is in a heart failure program and wanted to know what the patient's GFR levels were to see if she could get into a kidney program also. Please advise. Would like a call back with this information. CB#: [phone] ext. [phone]

## 2018-02-21 NOTE — Telephone Encounter (Signed)
Can you provide her with her nephrologist's information. They would best be able to assist her with what they feel is her baseline despite all recent acute events. Thanks.

## 2018-02-22 NOTE — Telephone Encounter (Signed)
Left detailed message on machine for Morgan Clay  With [facility].

## 2018-02-25 ENCOUNTER — Telehealth: Payer: Self-pay | Admitting: Internal Medicine

## 2018-02-25 NOTE — Telephone Encounter (Signed)
Patient reports abdominal pain and cramping for the last 4 days.  She is having multiple stools a day.  She will come in and see Alonza Bogus, PA tomorrow at 2:00

## 2018-02-26 ENCOUNTER — Ambulatory Visit (INDEPENDENT_AMBULATORY_CARE_PROVIDER_SITE_OTHER): Payer: Medicare Other | Admitting: Gastroenterology

## 2018-02-26 ENCOUNTER — Encounter: Payer: Self-pay | Admitting: Gastroenterology

## 2018-02-26 ENCOUNTER — Other Ambulatory Visit (INDEPENDENT_AMBULATORY_CARE_PROVIDER_SITE_OTHER): Payer: Medicare Other

## 2018-02-26 VITALS — BP 140/60 | HR 64 | Ht 59.0 in | Wt 143.6 lb

## 2018-02-26 DIAGNOSIS — R6883 Chills (without fever): Secondary | ICD-10-CM

## 2018-02-26 DIAGNOSIS — R1032 Left lower quadrant pain: Secondary | ICD-10-CM

## 2018-02-26 DIAGNOSIS — R1031 Right lower quadrant pain: Secondary | ICD-10-CM | POA: Diagnosis not present

## 2018-02-26 DIAGNOSIS — R197 Diarrhea, unspecified: Secondary | ICD-10-CM | POA: Insufficient documentation

## 2018-02-26 LAB — CBC WITH DIFFERENTIAL/PLATELET
BASOS PCT: 1.1 % (ref 0.0–3.0)
Basophils Absolute: 0.1 10*3/uL (ref 0.0–0.1)
EOS ABS: 0.3 10*3/uL (ref 0.0–0.7)
EOS PCT: 3.8 % (ref 0.0–5.0)
HCT: 42.3 % (ref 36.0–46.0)
HEMOGLOBIN: 13.9 g/dL (ref 12.0–15.0)
Lymphocytes Relative: 12.7 % (ref 12.0–46.0)
Lymphs Abs: 1 10*3/uL (ref 0.7–4.0)
MCHC: 32.7 g/dL (ref 30.0–36.0)
MCV: 85.7 fl (ref 78.0–100.0)
MONO ABS: 0.8 10*3/uL (ref 0.1–1.0)
Monocytes Relative: 11.2 % (ref 3.0–12.0)
NEUTROS PCT: 71.2 % (ref 43.0–77.0)
Neutro Abs: 5.4 10*3/uL (ref 1.4–7.7)
Platelets: 230 10*3/uL (ref 150.0–400.0)
RBC: 4.94 Mil/uL (ref 3.87–5.11)
RDW: 18.9 % — AB (ref 11.5–15.5)
WBC: 7.6 10*3/uL (ref 4.0–10.5)

## 2018-02-26 LAB — BASIC METABOLIC PANEL
BUN: 41 mg/dL — AB (ref 6–23)
CO2: 26 mEq/L (ref 19–32)
Calcium: 9.9 mg/dL (ref 8.4–10.5)
Chloride: 102 mEq/L (ref 96–112)
Creatinine, Ser: 2.21 mg/dL — ABNORMAL HIGH (ref 0.40–1.20)
GFR: 28.22 mL/min — AB (ref >60.00)
GLUCOSE: 109 mg/dL — AB (ref 70–99)
Potassium: 3.4 mEq/L — ABNORMAL LOW (ref 3.5–5.1)
Sodium: 137 mEq/L (ref 135–145)

## 2018-02-26 MED ORDER — DICYCLOMINE HCL 10 MG PO CAPS: 10.0000 mg | ORAL_CAPSULE | Freq: Three times a day (TID) | ORAL | 0 refills | Status: DC

## 2018-02-26 NOTE — Patient Instructions (Signed)
If you are age 70 or older, your body mass index should be between 23-30. Your Body mass index is 29 kg/m. If this is out of the aforementioned range listed, please consider follow up with your Primary Care Provider.  If you are age 55 or younger, your body mass index should be between 19-25. Your Body mass index is 29 kg/m. If this is out of the aformentioned range listed, please consider follow up with your Primary Care Provider.   Your provider has requested that you go to the basement level for lab work before leaving today. Press "B" on the elevator. The lab is located at the first door on the left as you exit the elevator.  We have sent the following medications to your pharmacy for you to pick up at your convenience: Bentyl 10 mg  You have been scheduled for a CT scan of the abdomen and pelvis at [facility] are scheduled on 02/27/18 at 9 am. You should arrive 15 minutes prior to your appointment time for registration. Please follow the written instructions below on the day of your exam:  WARNING: IF YOU ARE ALLERGIC TO IODINE/X-RAY DYE, PLEASE NOTIFY RADIOLOGY IMMEDIATELY AT [phone]! YOU WILL BE GIVEN A 13 HOUR PREMEDICATION PREP.  1) Do not eat or drink anything after 5 am (4 hours prior to your test) 2) You have been given 2 bottles of oral contrast to drink. The solution may taste better if refrigerated, but do NOT add ice or any other liquid to this solution. Shake well before drinking.    Drink 1 bottle of contrast @ 7 am (2 hours prior to your exam)  Drink 1 bottle of contrast @ 8 am (1 hour prior to your exam)  You may take any medications as prescribed with a small amount of water, if necessary. If you take any of the following medications: METFORMIN, GLUCOPHAGE, GLUCOVANCE, AVANDAMET, RIOMET, FORTAMET, [place] MET, JANUMET, GLUMETZA or METAGLIP, you MAY be asked to HOLD this medication 48 hours AFTER the exam.  The purpose of you drinking the oral  contrast is to aid in the visualization of your intestinal tract. The contrast solution may cause some diarrhea. Depending on your individual set of symptoms, you may also receive an intravenous injection of x-ray contrast/dye. Plan on being at [facility] for 30 minutes or longer, depending on the type of exam you are having performed.  This test typically takes 30-45 minutes to complete.  If you have any questions regarding your exam or if you need to reschedule, you may call the CT department at [phone] between the hours of 8:00 am and 5:00 pm, Monday-Friday.  ______________________________________________________________________  Use Tylenol and heating pad.  Thank you for choosing me and [facility].   Alonza Bogus, PA-C

## 2018-02-26 NOTE — Progress Notes (Signed)
02/26/2018 Morgan Clay [id number] [birth year]   HISTORY OF PRESENT ILLNESS: This is a pleasant 72 year old female who is a patient of Dr. Celesta Aver.  She is known here mostly for her issues with iron deficiency anemia and heme positive stools.  She is now off of her Eliquis and her hemoglobin has been within normal range.  She denies any issues with bleeding recently.  She is actually here today with complaints of lower abdominal pain and diarrhea for the past week.  She complains of both right and lower quadrant abdominal pains, but may be slightly more significant on the right.  She reports that she is having multiple bowel movements a day and having nocturnal stools as well.  She admits to some chills, but no documented fever.  No nausea or vomiting.  Denies any sick contacts or any recent antibiotic use.  Did have some issues with diverticulitis earlier this year as seen on CT scan.   Past Medical History:  Diagnosis Date  . A-fib ([place])    on Eliquis  . AICD (automatic cardioverter/defibrillator) present 10/06/2015  . Anemia   . Arthritis    "qwhere" (01/03/2018)  . Asthma    reports mild asthma since childhood - had COPD on dx list from prior PCP  . Chronic bronchitis ([place])    "get it most years; not this past year though" (01/03/2018)  . Chronic kidney disease    "? stage;  followed by [facility]; [name]" (01/03/2018)  . Chronic systolic congestive heart failure ([place])   . DEPRESSION 12/17/2009   Annotation: PHQ-9 score = 14 done on 12/17/2009 Qualifier: Diagnosis of  By: Hassell Done FNP, Tori Milks    . Diverticulosis   . FIBROIDS, UTERUS 03/05/2008  . Glaucoma   . Hepatitis C    HEPATITIS C - s/p treatment with Harvoni, saw hepatology, Dawn Drazek  . History of blood transfusion ~ 11/2017  . Hyperlipemia 12/06/2012  . HYPERTENSION, BENIGN 04/24/2007  . Hypothyroidism   . LBBB (left bundle branch block)   . Lower GI bleeding    "been dealing w/it since 07/2017"  (01/03/2018)  . Nonischemic cardiomyopathy ([place])   . OBESITY 05/27/2009  . OBSTRUCTIVE SLEEP APNEA 11/14/2007   no CPAP  . OSTEOPENIA 09/30/2008  . Personal history of other infectious and parasitic disease    Hepatitis B  . Pneumonia    "several times" (01/03/2018)   Past Surgical History:  Procedure Laterality Date  . APPENDECTOMY    . CARDIAC CATHETERIZATION    . CARDIOVERSION N/A 12/14/2014   Procedure: CARDIOVERSION;  Surgeon: Dorothy Spark, MD;  Location[facility] ENDOSCOPY;  Service: Cardiovascular;  Laterality: N/A;  . CARDIOVERSION N/A 02/26/2017   Procedure: CARDIOVERSION;  Surgeon: Evans Lance, MD;  Location: [place] CV LAB;  Service: Cardiovascular;  Laterality: N/A;  . CARDIOVERSION N/A 07/24/2017   Procedure: CARDIOVERSION;  Surgeon: Thayer Headings, MD;  Location: [facility] ENDOSCOPY;  Service: Cardiovascular;  Laterality: N/A;  . COLONOSCOPY N/A 11/08/2017   Procedure: COLONOSCOPY;  Surgeon: Milus Banister, MD;  Location: [facility] ENDOSCOPY;  Service: Endoscopy;  Laterality: N/A;  . DILATION AND CURETTAGE OF UTERUS    . EP IMPLANTABLE DEVICE N/A 10/06/2015   Procedure: BIV ICD Generator Changeout;  Surgeon: [name], MD;  Location: [place] CV LAB;  Service: Cardiovascular;  Laterality: N/A;  . ESOPHAGOGASTRODUODENOSCOPY N/A 10/26/2017   Procedure: ESOPHAGOGASTRODUODENOSCOPY (EGD);  Surgeon: Ladene Artist, MD;  Location: [facility] ENDOSCOPY;  Service: Endoscopy;  Laterality:  N/A;  . ESOPHAGOGASTRODUODENOSCOPY (EGD) WITH PROPOFOL N/A 11/07/2017   Procedure: ESOPHAGOGASTRODUODENOSCOPY (EGD) WITH PROPOFOL;  Surgeon: Milus Banister, MD;  Location: [facility] ENDOSCOPY;  Service: Endoscopy;  Laterality: N/A;  . HOT HEMOSTASIS N/A 10/26/2017   Procedure: HOT HEMOSTASIS (ARGON PLASMA COAGULATION/BICAP);  Surgeon: Ladene Artist, MD;  Location: [facility] ENDOSCOPY;  Service: Endoscopy;  Laterality: N/A;  . INSERT / REPLACE / REMOVE PACEMAKER  ?2008  . POLYPECTOMY  11/08/2017   Procedure:  POLYPECTOMY;  Surgeon: Milus Banister, MD;  Location: [facility] ENDOSCOPY;  Service: Endoscopy;;  . RIGHT HEART CATH N/A 02/13/2018   Procedure: RIGHT HEART CATH;  Surgeon: [name], MD;  Location: [facility] CV LAB;  Service: Cardiovascular;  Laterality: N/A;  . TUBAL LIGATION      reports that she has never smoked. She has never used smokeless tobacco. She reports that she drinks alcohol. She reports that she does not use drugs. family history includes Asthma in her father and sister; Heart attack in her father and mother; Lung cancer in her sister; Stroke in her brother. No Known Allergies    Outpatient Encounter Medications as of 02/26/2018  Medication Sig  . albuterol (PROVENTIL HFA;VENTOLIN HFA) 108 (90 Base) MCG/ACT inhaler Inhale 2 puffs into the lungs every 6 (six) hours as needed for wheezing or shortness of breath.  . allopurinol (ZYLOPRIM) 100 MG tablet Take 1/2 tablet by mouth daily (Patient taking differently: Take 50 mg by mouth daily. )  . amiodarone (PACERONE) 200 MG tablet Take 200 mg by mouth at bedtime.   [name] BIDIL 20-37.5 MG tablet TAKE 1 TABLET BY MOUTH TWICE DAILY (Patient taking differently: Take 1 tablet by mouth 2 (two) times daily. )  . carvedilol (COREG) 25 MG tablet Take 25 mg by mouth 2 (two) times daily.  . colchicine 0.6 MG tablet Take 0.6 mg by mouth daily as needed. Gout flare ups  . cyanocobalamin (,VITAMIN B-12,) 1000 MCG/ML injection Inject 1,000 mcg into the muscle every 30 (thirty) days.  [name] levothyroxine (SYNTHROID, LEVOTHROID) 125 MCG tablet Take 125 mcg by mouth daily before breakfast.  . nitroGLYCERIN (NITROSTAT) 0.4 MG SL tablet Place 0.4 mg under the tongue every 5 (five) minutes as needed for chest pain.  . pantoprazole (PROTONIX) 40 MG tablet Take 1 tablet (40 mg total) by mouth daily.  . polyethylene glycol (MIRALAX / GLYCOLAX) packet Take 17 g by mouth 2 (two) times daily.  . potassium chloride 20 MEQ TBCR Take 20 mEq by mouth daily.  .  sertraline (ZOLOFT) 50 MG tablet Take 1.5 tablets (75 mg total) by mouth daily.  [name] spironolactone (ALDACTONE) 25 MG tablet Take 1 tablet (25 mg total) by mouth daily. Hold until after you repeat BMP blood work on 01/07/2018  . torsemide (DEMADEX) 20 MG tablet Take 2 tablets (40 mg total) by mouth daily.   No facility-administered encounter medications on file as of 02/26/2018.      REVIEW OF SYSTEMS  : All other systems reviewed and negative except where noted in the History of Present Illness.   PHYSICAL EXAM: BP 140/60   Pulse 64   Ht 4\' 11"  (1.499 m)   Wt 143 lb 9.6 oz (65.1 kg)   BMI 29.00 kg/m  General: Well developed black female in no acute distress Head: Normocephalic and atraumatic Eyes:  Sclerae anicteric, conjunctiva pink. Ears: Normal auditory acuity Lungs: Clear throughout to auscultation; no increased WOB. Heart: Regular rate and rhythm; no M/R/G. Abdomen: Soft, non-distended.  BS  present.  Moderate lower abdominal TTP.  Musculoskeletal: Symmetrical with no gross deformities  Skin: No lesions on visible extremities Extremities: No edema  Neurological: Alert oriented x 4, grossly non-focal Psychological:  Alert and cooperative. Normal mood and affect  ASSESSMENT AND PLAN: *72 year old female with complaints of RLQ and LLQ abdominal pain and diarrhea x one week.  Some chills as well but no documented fever.  Has history of diverticulitis as documented by CT scan earlier this year but unsure if that is what is going on as diarrhea seems quite significant as well.  Will plan for stool studies.  Will check CBC and BMP.  We will also plan for CT scan abdomen pelvis with PO contrast only due to renal function, which we were able to get scheduled for tomorrow morning.  We will give some Bentyl to use for abdominal discomfort in the interim and she can take Tylenol and use a heating pad.  Advised on a clear liquid diet for today.   CC:  [name], DO

## 2018-02-27 ENCOUNTER — Encounter (HOSPITAL_COMMUNITY): Payer: Self-pay

## 2018-02-27 ENCOUNTER — Ambulatory Visit (HOSPITAL_COMMUNITY)
Admission: RE | Admit: 2018-02-27 | Discharge: 2018-02-27 | Disposition: A | Discharge status: Home / Self Care | Payer: Medicare Other | Source: Ambulatory Visit | Attending: Gastroenterology | Admitting: Gastroenterology

## 2018-02-27 ENCOUNTER — Ambulatory Visit (HOSPITAL_BASED_OUTPATIENT_CLINIC_OR_DEPARTMENT_OTHER)
Admission: RE | Admit: 2018-02-27 | Discharge: 2018-02-27 | Disposition: A | Discharge status: Home / Self Care | Payer: Medicare Other | Source: Ambulatory Visit | Attending: Internal Medicine | Admitting: Internal Medicine

## 2018-02-27 ENCOUNTER — Telehealth: Payer: Self-pay | Admitting: Gastroenterology

## 2018-02-27 ENCOUNTER — Ambulatory Visit (HOSPITAL_BASED_OUTPATIENT_CLINIC_OR_DEPARTMENT_OTHER)
Admission: RE | Admit: 2018-02-27 | Discharge: 2018-02-27 | Disposition: A | Discharge status: Home / Self Care | Payer: Medicare Other | Source: Ambulatory Visit

## 2018-02-27 ENCOUNTER — Encounter (HOSPITAL_COMMUNITY): Payer: Self-pay | Admitting: Emergency Medicine

## 2018-02-27 ENCOUNTER — Other Ambulatory Visit (HOSPITAL_COMMUNITY): Payer: Medicare Other

## 2018-02-27 ENCOUNTER — Other Ambulatory Visit: Payer: Self-pay

## 2018-02-27 ENCOUNTER — Inpatient Hospital Stay (HOSPITAL_COMMUNITY)
Admission: EM | Admit: 2018-02-27 | Discharge: 2018-03-06 | DRG: 392 | Disposition: A | Discharge status: Home / Self Care | Payer: Medicare Other | Attending: Internal Medicine | Admitting: Internal Medicine

## 2018-02-27 VITALS — BP 168/72 | HR 60 | Wt 142.6 lb

## 2018-02-27 DIAGNOSIS — K254 Chronic or unspecified gastric ulcer with hemorrhage: Secondary | ICD-10-CM

## 2018-02-27 DIAGNOSIS — E039 Hypothyroidism, unspecified: Secondary | ICD-10-CM | POA: Diagnosis present

## 2018-02-27 DIAGNOSIS — K5792 Diverticulitis of intestine, part unspecified, without perforation or abscess without bleeding: Secondary | ICD-10-CM

## 2018-02-27 DIAGNOSIS — I272 Pulmonary hypertension, unspecified: Secondary | ICD-10-CM

## 2018-02-27 DIAGNOSIS — I130 Hypertensive heart and chronic kidney disease with heart failure and stage 1 through stage 4 chronic kidney disease, or unspecified chronic kidney disease: Secondary | ICD-10-CM | POA: Insufficient documentation

## 2018-02-27 DIAGNOSIS — E1159 Type 2 diabetes mellitus with other circulatory complications: Secondary | ICD-10-CM | POA: Diagnosis present

## 2018-02-27 DIAGNOSIS — N179 Acute kidney failure, unspecified: Secondary | ICD-10-CM | POA: Diagnosis not present

## 2018-02-27 DIAGNOSIS — R1032 Left lower quadrant pain: Secondary | ICD-10-CM

## 2018-02-27 DIAGNOSIS — Z8619 Personal history of other infectious and parasitic diseases: Secondary | ICD-10-CM

## 2018-02-27 DIAGNOSIS — E871 Hypo-osmolality and hyponatremia: Secondary | ICD-10-CM | POA: Diagnosis not present

## 2018-02-27 DIAGNOSIS — F329 Major depressive disorder, single episode, unspecified: Secondary | ICD-10-CM | POA: Insufficient documentation

## 2018-02-27 DIAGNOSIS — Z6829 Body mass index (BMI) 29.0-29.9, adult: Secondary | ICD-10-CM | POA: Insufficient documentation

## 2018-02-27 DIAGNOSIS — E876 Hypokalemia: Secondary | ICD-10-CM | POA: Diagnosis not present

## 2018-02-27 DIAGNOSIS — I428 Other cardiomyopathies: Secondary | ICD-10-CM | POA: Diagnosis present

## 2018-02-27 DIAGNOSIS — J189 Pneumonia, unspecified organism: Secondary | ICD-10-CM | POA: Diagnosis not present

## 2018-02-27 DIAGNOSIS — E038 Other specified hypothyroidism: Secondary | ICD-10-CM | POA: Diagnosis not present

## 2018-02-27 DIAGNOSIS — K31819 Angiodysplasia of stomach and duodenum without bleeding: Secondary | ICD-10-CM

## 2018-02-27 DIAGNOSIS — N184 Chronic kidney disease, stage 4 (severe): Secondary | ICD-10-CM | POA: Diagnosis not present

## 2018-02-27 DIAGNOSIS — R1030 Lower abdominal pain, unspecified: Secondary | ICD-10-CM

## 2018-02-27 DIAGNOSIS — E1129 Type 2 diabetes mellitus with other diabetic kidney complication: Secondary | ICD-10-CM | POA: Diagnosis present

## 2018-02-27 DIAGNOSIS — Z79899 Other long term (current) drug therapy: Secondary | ICD-10-CM

## 2018-02-27 DIAGNOSIS — I480 Paroxysmal atrial fibrillation: Secondary | ICD-10-CM | POA: Diagnosis not present

## 2018-02-27 DIAGNOSIS — R6883 Chills (without fever): Secondary | ICD-10-CM

## 2018-02-27 DIAGNOSIS — J449 Chronic obstructive pulmonary disease, unspecified: Secondary | ICD-10-CM | POA: Diagnosis not present

## 2018-02-27 DIAGNOSIS — G4733 Obstructive sleep apnea (adult) (pediatric): Secondary | ICD-10-CM

## 2018-02-27 DIAGNOSIS — Z8249 Family history of ischemic heart disease and other diseases of the circulatory system: Secondary | ICD-10-CM | POA: Insufficient documentation

## 2018-02-27 DIAGNOSIS — I10 Essential (primary) hypertension: Secondary | ICD-10-CM

## 2018-02-27 DIAGNOSIS — I083 Combined rheumatic disorders of mitral, aortic and tricuspid valves: Secondary | ICD-10-CM | POA: Insufficient documentation

## 2018-02-27 DIAGNOSIS — I5032 Chronic diastolic (congestive) heart failure: Secondary | ICD-10-CM

## 2018-02-27 DIAGNOSIS — N183 Chronic kidney disease, stage 3 unspecified: Secondary | ICD-10-CM

## 2018-02-27 DIAGNOSIS — B370 Candidal stomatitis: Secondary | ICD-10-CM | POA: Diagnosis not present

## 2018-02-27 DIAGNOSIS — M109 Gout, unspecified: Secondary | ICD-10-CM | POA: Diagnosis present

## 2018-02-27 DIAGNOSIS — K5720 Diverticulitis of large intestine with perforation and abscess without bleeding: Secondary | ICD-10-CM | POA: Diagnosis not present

## 2018-02-27 DIAGNOSIS — R109 Unspecified abdominal pain: Secondary | ICD-10-CM | POA: Insufficient documentation

## 2018-02-27 DIAGNOSIS — I484 Atypical atrial flutter: Secondary | ICD-10-CM

## 2018-02-27 DIAGNOSIS — R197 Diarrhea, unspecified: Secondary | ICD-10-CM

## 2018-02-27 DIAGNOSIS — R0602 Shortness of breath: Secondary | ICD-10-CM

## 2018-02-27 DIAGNOSIS — H409 Unspecified glaucoma: Secondary | ICD-10-CM | POA: Diagnosis present

## 2018-02-27 DIAGNOSIS — E785 Hyperlipidemia, unspecified: Secondary | ICD-10-CM | POA: Diagnosis present

## 2018-02-27 DIAGNOSIS — Z95810 Presence of automatic (implantable) cardiac defibrillator: Secondary | ICD-10-CM

## 2018-02-27 DIAGNOSIS — E1122 Type 2 diabetes mellitus with diabetic chronic kidney disease: Secondary | ICD-10-CM | POA: Insufficient documentation

## 2018-02-27 DIAGNOSIS — E669 Obesity, unspecified: Secondary | ICD-10-CM

## 2018-02-27 DIAGNOSIS — I152 Hypertension secondary to endocrine disorders: Secondary | ICD-10-CM | POA: Diagnosis present

## 2018-02-27 DIAGNOSIS — K5780 Diverticulitis of intestine, part unspecified, with perforation and abscess without bleeding: Secondary | ICD-10-CM

## 2018-02-27 DIAGNOSIS — R1031 Right lower quadrant pain: Secondary | ICD-10-CM

## 2018-02-27 DIAGNOSIS — E872 Acidosis: Secondary | ICD-10-CM | POA: Diagnosis not present

## 2018-02-27 DIAGNOSIS — Z79890 Hormone replacement therapy (postmenopausal): Secondary | ICD-10-CM | POA: Insufficient documentation

## 2018-02-27 DIAGNOSIS — I255 Ischemic cardiomyopathy: Secondary | ICD-10-CM | POA: Diagnosis not present

## 2018-02-27 DIAGNOSIS — I129 Hypertensive chronic kidney disease with stage 1 through stage 4 chronic kidney disease, or unspecified chronic kidney disease: Secondary | ICD-10-CM | POA: Diagnosis not present

## 2018-02-27 DIAGNOSIS — K567 Ileus, unspecified: Secondary | ICD-10-CM | POA: Diagnosis not present

## 2018-02-27 DIAGNOSIS — I4892 Unspecified atrial flutter: Secondary | ICD-10-CM

## 2018-02-27 DIAGNOSIS — K7460 Unspecified cirrhosis of liver: Secondary | ICD-10-CM | POA: Diagnosis not present

## 2018-02-27 DIAGNOSIS — M8580 Other specified disorders of bone density and structure, unspecified site: Secondary | ICD-10-CM | POA: Diagnosis not present

## 2018-02-27 LAB — COMPREHENSIVE METABOLIC PANEL
ALBUMIN: 3.7 g/dL (ref 3.5–5.0)
ALT: 30 U/L (ref 0–44)
AST: 56 U/L — ABNORMAL HIGH (ref 15–41)
Alkaline Phosphatase: 80 U/L (ref 38–126)
Anion gap: 15 (ref 5–15)
BILIRUBIN TOTAL: 1.8 mg/dL — AB (ref 0.3–1.2)
BUN: 38 mg/dL — ABNORMAL HIGH (ref 8–23)
CALCIUM: 8.6 mg/dL — AB (ref 8.9–10.3)
CO2: 20 mmol/L — ABNORMAL LOW (ref 22–32)
Chloride: 101 mmol/L (ref 98–111)
Creatinine, Ser: 2.26 mg/dL — ABNORMAL HIGH (ref 0.44–1.00)
GFR, EST AFRICAN AMERICAN: 24 mL/min — AB (ref >60)
GFR, EST NON AFRICAN AMERICAN: 21 mL/min — AB (ref >60)
Glucose, Bld: 111 mg/dL — ABNORMAL HIGH (ref 70–99)
POTASSIUM: 5.9 mmol/L — AB (ref 3.5–5.1)
Sodium: 136 mmol/L (ref 135–145)
TOTAL PROTEIN: 8.2 g/dL — AB (ref 6.5–8.1)

## 2018-02-27 LAB — CBC
HEMATOCRIT: 45.8 % (ref 36.0–46.0)
Hemoglobin: 14.2 g/dL (ref 12.0–15.0)
MCH: 26.8 pg (ref 26.0–34.0)
MCHC: 31 g/dL (ref 30.0–36.0)
MCV: 86.6 fL (ref 80.0–100.0)
PLATELETS: 296 10*3/uL (ref 150–400)
RBC: 5.29 MIL/uL — AB (ref 3.87–5.11)
RDW: 18.1 % — AB (ref 11.5–15.5)
WBC: 8 10*3/uL (ref 4.0–10.5)
nRBC: 0 % (ref 0.0–0.2)

## 2018-02-27 LAB — LIPASE, BLOOD: LIPASE: 39 U/L (ref 11–51)

## 2018-02-27 LAB — GLUCOSE, CAPILLARY: GLUCOSE-CAPILLARY: 102 mg/dL — AB (ref 70–99)

## 2018-02-27 MED ORDER — SODIUM CHLORIDE 0.9 % IV SOLN: 1.0000 g | INTRAVENOUS | Status: DC

## 2018-02-27 MED ORDER — METRONIDAZOLE IN NACL 5-0.79 MG/ML-% IV SOLN: 500.0000 mg | Freq: Three times a day (TID) | INTRAVENOUS | Status: DC

## 2018-02-27 MED ORDER — SODIUM CHLORIDE 0.9 % IV BOLUS
500.0000 mL | Freq: Once | INTRAVENOUS | Status: AC
  Administered 2018-02-27: 500 mL via INTRAVENOUS

## 2018-02-27 MED ORDER — AMIODARONE HCL 200 MG PO TABS
200.0000 mg | ORAL_TABLET | Freq: Every day | ORAL | Status: DC
  Administered 2018-02-27 – 2018-03-05 (×7): 200 mg via ORAL
  Filled 2018-02-27 (×7): qty 1

## 2018-02-27 MED ORDER — INSULIN ASPART 100 UNIT/ML ~~LOC~~ SOLN
0.0000 [IU] | Freq: Three times a day (TID) | SUBCUTANEOUS | Status: DC
  Administered 2018-03-04: 1 [IU] via SUBCUTANEOUS

## 2018-02-27 MED ORDER — METRONIDAZOLE IN NACL 5-0.79 MG/ML-% IV SOLN
500.0000 mg | Freq: Once | INTRAVENOUS | Status: DC
  Administered 2018-02-27: 500 mg via INTRAVENOUS
  Filled 2018-02-27: qty 100

## 2018-02-27 MED ORDER — SODIUM CHLORIDE 0.9 % IV SOLN
2.0000 g | Freq: Once | INTRAVENOUS | Status: AC
  Administered 2018-02-27: 2 g via INTRAVENOUS
  Filled 2018-02-27: qty 20

## 2018-02-27 MED ORDER — PIPERACILLIN-TAZOBACTAM IN DEX 2-0.25 GM/50ML IV SOLN
2.2500 g | Freq: Three times a day (TID) | INTRAVENOUS | Status: DC
  Administered 2018-02-27 – 2018-02-28 (×2): 2.25 g via INTRAVENOUS
  Filled 2018-02-27 (×3): qty 50

## 2018-02-27 MED ORDER — FAMOTIDINE IN NACL 20-0.9 MG/50ML-% IV SOLN
20.0000 mg | INTRAVENOUS | Status: DC
  Administered 2018-02-27: 20 mg via INTRAVENOUS
  Filled 2018-02-27: qty 50

## 2018-02-27 MED ORDER — LIP MEDEX EX OINT
TOPICAL_OINTMENT | CUTANEOUS | Status: DC | PRN
  Filled 2018-02-27: qty 7

## 2018-02-27 MED ORDER — HYDRALAZINE HCL 20 MG/ML IJ SOLN
10.0000 mg | INTRAMUSCULAR | Status: DC | PRN
  Administered 2018-02-27 – 2018-03-05 (×2): 10 mg via INTRAVENOUS
  Filled 2018-02-27 (×2): qty 1

## 2018-02-27 MED ORDER — SERTRALINE HCL 50 MG PO TABS
75.0000 mg | ORAL_TABLET | Freq: Every day | ORAL | Status: DC
  Administered 2018-02-27 – 2018-03-06 (×7): 75 mg via ORAL
  Filled 2018-02-27 (×7): qty 2

## 2018-02-27 MED ORDER — ALLOPURINOL 100 MG PO TABS
50.0000 mg | ORAL_TABLET | Freq: Every day | ORAL | Status: DC
  Administered 2018-02-27 – 2018-02-28 (×2): 50 mg via ORAL
  Filled 2018-02-27 (×2): qty 1

## 2018-02-27 MED ORDER — LEVOTHYROXINE SODIUM 125 MCG PO TABS
125.0000 ug | ORAL_TABLET | Freq: Every day | ORAL | Status: DC
  Administered 2018-02-28 – 2018-03-06 (×7): 125 ug via ORAL
  Filled 2018-02-27 (×8): qty 1

## 2018-02-27 MED ORDER — NITROGLYCERIN 0.4 MG SL SUBL: 0.4000 mg | SUBLINGUAL_TABLET | SUBLINGUAL | Status: DC | PRN

## 2018-02-27 MED ORDER — ONDANSETRON HCL 4 MG/2ML IJ SOLN
4.0000 mg | Freq: Once | INTRAMUSCULAR | Status: AC
  Administered 2018-02-27: 4 mg via INTRAVENOUS
  Filled 2018-02-27: qty 2

## 2018-02-27 MED ORDER — POTASSIUM CHLORIDE 10 MEQ/100ML IV SOLN
10.0000 meq | INTRAVENOUS | Status: DC
  Administered 2018-02-27: 10 meq via INTRAVENOUS
  Filled 2018-02-27: qty 100

## 2018-02-27 MED ORDER — SODIUM CHLORIDE 0.9 % IV SOLN
INTRAVENOUS | Status: DC | PRN
  Administered 2018-02-27: 250 mL via INTRAVENOUS
  Administered 2018-03-02: 1000 mL via INTRAVENOUS
  Administered 2018-03-05: 05:00:00 via INTRAVENOUS

## 2018-02-27 MED ORDER — HEPARIN SODIUM (PORCINE) 5000 UNIT/ML IJ SOLN
5000.0000 [IU] | Freq: Three times a day (TID) | INTRAMUSCULAR | Status: DC
  Administered 2018-02-27 – 2018-03-05 (×13): 5000 [IU] via SUBCUTANEOUS
  Filled 2018-02-27 (×18): qty 1

## 2018-02-27 MED ORDER — ISOSORB DINITRATE-HYDRALAZINE 20-37.5 MG PO TABS
1.0000 | ORAL_TABLET | Freq: Two times a day (BID) | ORAL | Status: DC
  Administered 2018-02-27 – 2018-03-06 (×13): 1 via ORAL
  Filled 2018-02-27 (×14): qty 1

## 2018-02-27 MED ORDER — COLCHICINE 0.6 MG PO TABS
0.6000 mg | ORAL_TABLET | Freq: Every day | ORAL | Status: DC
  Administered 2018-02-27 – 2018-02-28 (×2): 0.6 mg via ORAL
  Filled 2018-02-27 (×2): qty 1

## 2018-02-27 MED ORDER — CARVEDILOL 25 MG PO TABS
25.0000 mg | ORAL_TABLET | Freq: Two times a day (BID) | ORAL | Status: DC
  Administered 2018-02-27 – 2018-03-06 (×13): 25 mg via ORAL
  Filled 2018-02-27 (×13): qty 1

## 2018-02-27 MED ORDER — ALBUTEROL SULFATE (2.5 MG/3ML) 0.083% IN NEBU: 2.5000 mg | INHALATION_SOLUTION | Freq: Four times a day (QID) | RESPIRATORY_TRACT | Status: DC | PRN

## 2018-02-27 MED ORDER — ONDANSETRON HCL 4 MG/2ML IJ SOLN
4.0000 mg | Freq: Four times a day (QID) | INTRAMUSCULAR | Status: DC | PRN
  Administered 2018-03-01 (×2): 4 mg via INTRAVENOUS
  Filled 2018-02-27 (×2): qty 2

## 2018-02-27 MED ORDER — TORSEMIDE 20 MG PO TABS: 20.0000 mg | ORAL_TABLET | Freq: Every day | ORAL | 11 refills | Status: DC

## 2018-02-27 MED ORDER — MORPHINE SULFATE (PF) 4 MG/ML IV SOLN
4.0000 mg | INTRAVENOUS | Status: DC | PRN
  Administered 2018-02-27 – 2018-03-02 (×11): 4 mg via INTRAVENOUS
  Filled 2018-02-27 (×11): qty 1

## 2018-02-27 MED ORDER — MORPHINE SULFATE (PF) 4 MG/ML IV SOLN
4.0000 mg | Freq: Once | INTRAVENOUS | Status: AC
  Administered 2018-02-27: 4 mg via INTRAVENOUS
  Filled 2018-02-27: qty 1

## 2018-02-27 NOTE — Patient Instructions (Signed)
HOLD TORSEMIDE AND POTASSIUM FOR 2 DAYS, then restart  Torsemide 20 mg, one tab daily Potassium 20 MEQ daily   Your physician recommends that you schedule a follow-up appointment in: 8 weeks with Dr [name]  Do the following things EVERYDAY: 1) Weigh yourself in the morning before breakfast. Write it down and keep it in a log. 2) Take your medicines as prescribed 3) Eat low salt foods-Limit salt (sodium) to 2000 mg per day.  4) Stay as active as you can everyday 5) Limit all fluids for the day to less than 2 liters

## 2018-02-27 NOTE — Telephone Encounter (Signed)
The patient has been notified of this information and all questions answered.   The pt has been advised of the information and verbalized understanding.    She states she will go to [facility]  ED      Zehr, Laban Emperor, PA-C  Timothy Lasso, RN        Please let her know that her CT scan showed diverticulitis with a 3 cm abscess. I discussed with [name] and we think that she needs to be hospitalized for some IV antibiotics. She will need to go through the emergency department. It is her decision on where to go, but [facility] may [facility].   Thank you,   Jess

## 2018-02-27 NOTE — ED Provider Notes (Signed)
Morgan Clay Provider Note   CSN: [id number] Arrival date & time: 02/27/18  1434     History   Chief Complaint Chief Complaint  Patient presents with  . Abdominal Pain    HPI Morgan Clay is a 72 y.o. female.  HPI   Morgan Clay is a 72yo female with a history of CHF, CKD, type 2 diabetes, hypertension, hyperlipidemia, depression, diverticulitis who presents to the emergency department via recommendation of her PCP for admission due to outpatient CT scan showing sigmoid diverticulitis with neighboring 2.9 cm abscess.  Patient reports that she has had progressively worsening bilateral lower abdominal pain for the past week now.  She reports pain is intermittent, cramping in about an 8/10 in severity currently.  She has had associated diarrhea, endorsing 10 episodes of nonbloody diarrhea daily.  Also reports a pressure-like sensation with urination.  Denies recent antibiotic use, hospitalization or travel outside the country.  She denies fevers, chills, nausea/vomiting, pelvic pain, vaginal discharge, vaginal bleeding, chest pain, shortness of breath, lightheadedness or syncope.  No prior abdominal surgeries.  Past Medical History:  Diagnosis Date  . A-fib ([place])    on Eliquis  . AICD (automatic cardioverter/defibrillator) present 10/06/2015  . Anemia   . Arthritis    "qwhere" (01/03/2018)  . Asthma    reports mild asthma since childhood - had COPD on dx list from prior PCP  . Chronic bronchitis ([place])    "get it most years; not this past year though" (01/03/2018)  . Chronic kidney disease    "? stage;  followed by [facility]; [name]" (01/03/2018)  . Chronic systolic congestive heart failure ([place])   . DEPRESSION 12/17/2009   Annotation: PHQ-9 score = 14 done on 12/17/2009 Qualifier: Diagnosis of  By: Hassell Done FNP, Tori Milks    . Diverticulosis   . FIBROIDS, UTERUS 03/05/2008  . Glaucoma   . Hepatitis C    HEPATITIS C - s/p  treatment with Harvoni, saw hepatology, Dawn Drazek  . History of blood transfusion ~ 11/2017  . Hyperlipemia 12/06/2012  . HYPERTENSION, BENIGN 04/24/2007  . Hypothyroidism   . LBBB (left bundle branch block)   . Lower GI bleeding    "been dealing w/it since 07/2017" (01/03/2018)  . Nonischemic cardiomyopathy ([place])   . OBESITY 05/27/2009  . OBSTRUCTIVE SLEEP APNEA 11/14/2007   no CPAP  . OSTEOPENIA 09/30/2008  . Personal history of other infectious and parasitic disease    Hepatitis B  . Pneumonia    "several times" (01/03/2018)    Patient Active Problem List   Diagnosis Date Noted  . RLQ abdominal pain 02/26/2018  . LLQ abdominal pain 02/26/2018  . Diarrhea 02/26/2018  . Chills 02/26/2018  . Anemia in chronic kidney disease 01/18/2018  . Pulmonary hypertension ([place]) on echocardiogram 01/14/2018  . Angiodysplasia of intestinal tract 01/14/2018  . GI bleeding 01/03/2018  . Heme positive stool 12/12/2017  . Polyp of transverse colon   . Diverticulosis of colon without hemorrhage   . Melena 11/06/2017  . Anemia 10/25/2017  . Atrial fibrillation, chronic 10/25/2017  . Aortic atherosclerosis ([place]) 08/30/2017  . Pulmonary edema, acute ([place]) 08/09/2017  . Pain and swelling of left upper extremity 08/08/2017  . ARF (acute renal failure) ([place])   . Enteritis   . Partial small bowel obstruction ([place])   . Diverticulitis 07/31/2017  . Hypertension associated with diabetes ([place]) 06/07/2017  . MDD (major depressive disorder), recurrent, in partial remission ([place]) 06/07/2017  . Asthma 05/22/2017  .  Chronic obstructive pulmonary disease ([place])   . Atypical atrial flutter ([place]) 09/28/2015  . Type 2 diabetes, controlled, with renal manifestation ([place]) 11/24/2013  . Chronic kidney disease - followed by [facility] 11/24/2013  . Nonischemic cardiomyopathy ([place]) 11/22/2010  . Obesity 05/27/2009  . OSTEOPENIA 09/30/2008  . OBSTRUCTIVE SLEEP APNEA 11/14/2007  . Chronic diastolic CHF  (congestive heart failure) ([place]) 04/24/2007  . Hypothyroidism 01/28/2007  . Biventricular ICD (implantable cardioverter-defibrillator) in place 01/08/2007    Past Surgical History:  Procedure Laterality Date  . APPENDECTOMY    . CARDIAC CATHETERIZATION    . CARDIOVERSION N/A 12/14/2014   Procedure: CARDIOVERSION;  Surgeon: Dorothy Spark, MD;  Location[facility] ENDOSCOPY;  Service: Cardiovascular;  Laterality: N/A;  . CARDIOVERSION N/A 02/26/2017   Procedure: CARDIOVERSION;  Surgeon: Evans Lance, MD;  Location: [facility] CV LAB;  Service: Cardiovascular;  Laterality: N/A;  . CARDIOVERSION N/A 07/24/2017   Procedure: CARDIOVERSION;  Surgeon: Thayer Headings, MD;  Location: [facility] ENDOSCOPY;  Service: Cardiovascular;  Laterality: N/A;  . COLONOSCOPY N/A 11/08/2017   Procedure: COLONOSCOPY;  Surgeon: Milus Banister, MD;  Location: [facility] ENDOSCOPY;  Service: Endoscopy;  Laterality: N/A;  . DILATION AND CURETTAGE OF UTERUS    . EP IMPLANTABLE DEVICE N/A 10/06/2015   Procedure: BIV ICD Generator Changeout;  Surgeon: [name], MD;  Location: [facility] CV LAB;  Service: Cardiovascular;  Laterality: N/A;  . ESOPHAGOGASTRODUODENOSCOPY N/A 10/26/2017   Procedure: ESOPHAGOGASTRODUODENOSCOPY (EGD);  Surgeon: Ladene Artist, MD;  Location: [facility] ENDOSCOPY;  Service: Endoscopy;  Laterality: N/A;  . ESOPHAGOGASTRODUODENOSCOPY (EGD) WITH PROPOFOL N/A 11/07/2017   Procedure: ESOPHAGOGASTRODUODENOSCOPY (EGD) WITH PROPOFOL;  Surgeon: Milus Banister, MD;  Location: [facility] ENDOSCOPY;  Service: Endoscopy;  Laterality: N/A;  . HOT HEMOSTASIS N/A 10/26/2017   Procedure: HOT HEMOSTASIS (ARGON PLASMA COAGULATION/BICAP);  Surgeon: Ladene Artist, MD;  Location: [facility] ENDOSCOPY;  Service: Endoscopy;  Laterality: N/A;  . INSERT / REPLACE / REMOVE PACEMAKER  ?2008  . POLYPECTOMY  11/08/2017   Procedure: POLYPECTOMY;  Surgeon: Milus Banister, MD;  Location: [facility] ENDOSCOPY;  Service: Endoscopy;;  . RIGHT HEART CATH N/A  02/13/2018   Procedure: RIGHT HEART CATH;  Surgeon: [name], MD;  Location: [facility] CV LAB;  Service: Cardiovascular;  Laterality: N/A;  . TUBAL LIGATION       OB History   None      Home Medications    Prior to Admission medications   Medication Sig Start Date End Date Taking? Authorizing Provider  albuterol (PROVENTIL HFA;VENTOLIN HFA) 108 (90 Base) MCG/ACT inhaler Inhale 2 puffs into the lungs every 6 (six) hours as needed for wheezing or shortness of breath. 01/05/18   Roxan Hockey, MD  allopurinol (ZYLOPRIM) 100 MG tablet Take 1/2 tablet by mouth daily Patient taking differently: Take 50 mg by mouth daily.  01/29/18   [name], DO  amiodarone (PACERONE) 200 MG tablet Take 200 mg by mouth at bedtime.     [provider]  BIDIL 20-37.5 MG tablet TAKE 1 TABLET BY MOUTH TWICE DAILY Patient taking differently: Take 1 tablet by mouth 2 (two) times daily.  04/26/17   [name], MD  carvedilol (COREG) 25 MG tablet Take 25 mg by mouth 2 (two) times daily. 12/17/17   [provider]  colchicine 0.6 MG tablet Take 0.6 mg by mouth daily as needed. Gout flare ups 02/06/18   [provider]  cyanocobalamin (,VITAMIN B-12,) 1000 MCG/ML injection Inject  1,000 mcg into the muscle every 30 (thirty) days.    [provider]  dicyclomine (BENTYL) 10 MG capsule Take 1 capsule (10 mg total) by mouth 3 (three) times daily before meals. 02/26/18   Zehr, Laban Emperor, PA-C  levothyroxine (SYNTHROID, LEVOTHROID) 125 MCG tablet Take 125 mcg by mouth daily before breakfast.    [provider]  nitroGLYCERIN (NITROSTAT) 0.4 MG SL tablet Place 0.4 mg under the tongue every 5 (five) minutes as needed for chest pain.    [provider]  pantoprazole (PROTONIX) 40 MG tablet Take 1 tablet (40 mg total) by mouth daily. 01/05/18   Roxan Hockey, MD  polyethylene glycol (MIRALAX / GLYCOLAX) packet Take 17 g by mouth 2 (two) times daily. 01/05/18    Roxan Hockey, MD  potassium chloride 20 MEQ TBCR Take 20 mEq by mouth daily. 02/08/18   [name], MD  sertraline (ZOLOFT) 50 MG tablet Take 1.5 tablets (75 mg total) by mouth daily. 01/29/18   [name], DO  spironolactone (ALDACTONE) 25 MG tablet Take 1 tablet (25 mg total) by mouth daily. Hold until after you repeat BMP blood work on 01/07/2018 01/05/18   Roxan Hockey, MD  torsemide (DEMADEX) 20 MG tablet Take 1 tablet (20 mg total) by mouth daily. 03/02/18   Conrad [place], NP    Family History Family History  Problem Relation Age of Onset  . Asthma Father   . Heart attack Father   . Asthma Sister   . Lung cancer Sister   . Heart attack Mother   . Stroke Brother   . Colon cancer Neg Hx   . Esophageal cancer Neg Hx   . Pancreatic cancer Neg Hx   . Stomach cancer Neg Hx   . Liver disease Neg Hx     Social History Social History   Tobacco Use  . Smoking status: Never Smoker  . Smokeless tobacco: Never Used  Substance Use Topics  . Alcohol use: Yes    Comment: 01/03/2018 "couple drinks/month"  . Drug use: No     Allergies   Patient has no known allergies.   Review of Systems Review of Systems  Constitutional: Negative for chills and fever.  Eyes: Negative for visual disturbance.  Respiratory: Negative for shortness of breath.   Cardiovascular: Negative for chest pain.  Gastrointestinal: Positive for abdominal pain and diarrhea. Negative for nausea and vomiting.  Genitourinary: Negative for difficulty urinating, dysuria, flank pain, frequency, hematuria, vaginal bleeding and vaginal discharge.  Musculoskeletal: Negative for back pain.  Skin: Negative for color change.  Neurological: Negative for syncope and numbness.  Psychiatric/Behavioral: Negative for agitation.  All other systems reviewed and are negative.    Physical Exam Updated Vital Signs BP (!) 172/65 (BP Location: Left Arm)   Pulse 66   Temp 98.6 F (37 C) (Oral)   Resp 20   SpO2  100%   Physical Exam  Constitutional: She is oriented to person, place, and time. She appears well-developed and well-nourished. No distress.  No acute distress, nontoxic-appearing.  HENT:  Head: Normocephalic and atraumatic.  Mucous memories moist.  Eyes: Right eye exhibits no discharge. Left eye exhibits no discharge.  Cardiovascular: Normal rate and regular rhythm.  Pulmonary/Chest: Effort normal and breath sounds normal. No stridor. No respiratory distress. She has no wheezes. She has no rales.  Abdominal:  Abdomen soft and nondistended.  Bowel sounds normoactive.  Tender to palpation diffusely over lower abdomen.  No guarding, rigidity or rebound  tenderness.  Musculoskeletal: Normal range of motion.  Neurological: She is alert and oriented to person, place, and time. Coordination normal.  Skin: Skin is warm and dry. She is not diaphoretic.  Psychiatric: She has a normal mood and affect. Her behavior is normal.  Nursing note and vitals reviewed.   ED Treatments / Results  Labs (all labs ordered are listed, but only abnormal results are displayed) Labs Reviewed  LIPASE, BLOOD  COMPREHENSIVE METABOLIC PANEL  CBC  URINALYSIS, ROUTINE W REFLEX MICROSCOPIC    EKG None  Radiology Ct Abdomen Pelvis Wo Contrast  Result Date: 02/27/2018 CLINICAL DATA:  Bilateral lower quadrant pain. Chills. Diarrhea. Chronic hepatitis-C. EXAM: CT ABDOMEN AND PELVIS WITHOUT CONTRAST TECHNIQUE: Multidetector CT imaging of the abdomen and pelvis was performed following the standard protocol without IV contrast. COMPARISON:  08/03/2017 FINDINGS: Lower chest: No acute findings. Hepatobiliary: Hepatic cirrhosis again demonstrated. No mass visualized on this unenhanced exam. High attenuation gallbladder sludge or tiny noncalcified gallstones noted. No evidence of cholecystitis or biliary ductal dilatation. Pancreas: No mass or inflammatory process visualized on this unenhanced exam. Spleen:  Within normal  limits in size. Adrenals/Urinary tract: No evidence of urolithiasis or hydronephrosis. Unremarkable unopacified urinary bladder. Stomach/Bowel: Diverticulosis is seen involving the descending and proximal sigmoid colon. Moderate wall thickening and adjacent inflammatory changes are seen involving the sigmoid colon. There is also wall thickening involving an adjacent small bowel loop in the central pelvis. In addition, there is a small fluid and gas containing collection in the central small bowel mesentery measuring 2.9 x 2.1 cm on image 51/3. No evidence of free intraperitoneal air. No evidence of bowel obstruction. Vascular/Lymphatic: No pathologically enlarged lymph nodes identified. No evidence of abdominal aortic aneurysm. Aortic atherosclerosis. Reproductive: Several calcified uterine fibroids are seen, largest measuring 6 cm. Adnexal regions are unremarkable in appearance. Other:  None. Musculoskeletal:  No suspicious bone lesions identified. IMPRESSION: Inflammatory process involving the mid sigmoid colon and adjacent small bowel loop, with small abscess in the adjacent small bowel mesentery measuring 2.9 cm. This is suspicious for sigmoid diverticulitis with small diverticular abscess, with primary enteritis considered less likely. Several calcified uterine fibroids, largest measuring 6 cm. Hepatic cirrhosis. Gallbladder sludge versus tiny gallstones. Electronically Signed   By: Earle Gell M.D.   On: 02/27/2018 10:44    Procedures Procedures (including critical care time)  Medications Ordered in ED Medications  morphine 4 MG/ML injection 4 mg (has no administration in time range)  ondansetron (ZOFRAN) injection 4 mg (has no administration in time range)  sodium chloride 0.9 % bolus 500 mL (has no administration in time range)  cefTRIAXone (ROCEPHIN) 2 g in sodium chloride 0.9 % 100 mL IVPB (has no administration in time range)    And  metroNIDAZOLE (FLAGYL) IVPB 500 mg (has no administration  in time range)     Initial Impression / Assessment and Plan / ED Course  I have reviewed the triage vital signs and the nursing notes.  Pertinent labs & imaging results that were available during my care of the patient were reviewed by me and considered in my medical decision making (see chart for details).    Patient with outpatient CT scan showing sigmoid diverticulitis with small diverticular abscess.  She is afebrile and nontoxic.  No peritoneal signs.  Patient started on ceftriaxone and Flagyl in the ED.  Plan to admit to the hospitalist service. Spoke with general surgery who will follow.   Final Clinical Impressions(s) / ED Diagnoses  Final diagnoses:  Colonic diverticular abscess  Diverticulitis    ED Discharge Orders    None       Bernarda Caffey 02/27/18 Penelope Galas, MD 02/27/18 [phone]

## 2018-02-27 NOTE — ED Notes (Signed)
ED TO INPATIENT HANDOFF REPORT  Name/Age/Gender Morgan Clay 72 y.o. female  Code Status    Code Status Orders  (From admission, onward)         Start     Ordered   02/27/18 1707  Full code  Continuous     02/27/18 1706        Code Status History    Date Active Date Inactive Code Status Order ID Comments User Context   02/13/2018 0920 02/13/2018 1344 Full Code [id number]  [name], MD Inpatient   01/03/2018 1638 01/05/2018 2009 Full Code [id number]  Karmen Bongo, MD ED   11/06/2017 2000 11/08/2017 1504 Full Code [id number]  Mariel Aloe, MD Inpatient   10/25/2017 1412 10/26/2017 1908 Full Code [id number]  Dessa Phi, DO Inpatient   07/31/2017 2051 08/17/2017 1720 Full Code [id number]  Bennie Pierini, MD Inpatient   02/23/2017 0040 03/01/2017 1553 Full Code [id number]  Rise Patience, MD ED   10/02/2015 0404 10/03/2015 1943 Full Code [id number]  Norval Morton, MD ED      Home/SNF/Other Home  Chief Complaint stomach ache  Level of Care/Admitting Diagnosis ED Disposition    ED Disposition Condition [facility] Area: [facility] [[id number]]  Level of Care: Telemetry [5]  Admit to tele based on following criteria: Complex arrhythmia (Bradycardia/Tachycardia)  Diagnosis: Diverticulitis [101751]  Admitting Physician: Shelly Coss [[id number]]  Attending Physician: Shelly Coss [[id number]]  Estimated length of stay: past midnight tomorrow  Certification:: I certify this patient will need inpatient services for at least 2 midnights  PT Class (Do Not Modify): Inpatient [101]  PT Acc Code (Do Not Modify): Private [1]       Medical History Past Medical History:  Diagnosis Date  . A-fib ([place])    on Eliquis  . AICD (automatic cardioverter/defibrillator) present 10/06/2015  . Anemia   . Arthritis    "qwhere" (01/03/2018)  . Asthma    reports mild asthma since childhood - had COPD on dx list from prior PCP  . Chronic  bronchitis ([place])    "get it most years; not this past year though" (01/03/2018)  . Chronic kidney disease    "? stage;  followed by [facility]; [name]" (01/03/2018)  . Chronic systolic congestive heart failure ([place])   . DEPRESSION 12/17/2009   Annotation: PHQ-9 score = 14 done on 12/17/2009 Qualifier: Diagnosis of  By: Hassell Done FNP, Tori Milks    . Diverticulosis   . FIBROIDS, UTERUS 03/05/2008  . Glaucoma   . Hepatitis C    HEPATITIS C - s/p treatment with Harvoni, saw hepatology, Dawn Drazek  . History of blood transfusion ~ 11/2017  . Hyperlipemia 12/06/2012  . HYPERTENSION, BENIGN 04/24/2007  . Hypothyroidism   . LBBB (left bundle branch block)   . Lower GI bleeding    "been dealing w/it since 07/2017" (01/03/2018)  . Nonischemic cardiomyopathy ([place])   . OBESITY 05/27/2009  . OBSTRUCTIVE SLEEP APNEA 11/14/2007   no CPAP  . OSTEOPENIA 09/30/2008  . Personal history of other infectious and parasitic disease    Hepatitis B  . Pneumonia    "several times" (01/03/2018)    Allergies No Known Allergies  IV Location/Drains/Wounds Patient Lines/Drains/Airways Status   Active Line/Drains/Airways    Name:   Placement date:   Placement time:   Site:   Days:   Peripheral IV 02/27/18 Left Antecubital   02/27/18    1614    Antecubital  less than 1          Labs/Imaging Results for orders placed or performed during the hospital encounter of 02/27/18 (from the past 48 hour(s))  CBC     Status: Abnormal   Collection Time: 02/27/18  4:30 PM  Result Value Ref Range   WBC 8.0 4.0 - 10.5 K/uL   RBC 5.29 (H) 3.87 - 5.11 MIL/uL   Hemoglobin 14.2 12.0 - 15.0 g/dL   HCT 45.8 36.0 - 46.0 %   MCV 86.6 80.0 - 100.0 fL   MCH 26.8 26.0 - 34.0 pg   MCHC 31.0 30.0 - 36.0 g/dL   RDW 18.1 (H) 11.5 - 15.5 %   Platelets 296 150 - 400 K/uL   nRBC 0.0 0.0 - 0.2 %    Comment: Performed at [facility], [place] [address] [place], [place] [address]   Ct Abdomen Pelvis Wo  Contrast  Result Date: 02/27/2018 CLINICAL DATA:  Bilateral lower quadrant pain. Chills. Diarrhea. Chronic hepatitis-C. EXAM: CT ABDOMEN AND PELVIS WITHOUT CONTRAST TECHNIQUE: Multidetector CT imaging of the abdomen and pelvis was performed following the standard protocol without IV contrast. COMPARISON:  08/03/2017 FINDINGS: Lower chest: No acute findings. Hepatobiliary: Hepatic cirrhosis again demonstrated. No mass visualized on this unenhanced exam. High attenuation gallbladder sludge or tiny noncalcified gallstones noted. No evidence of cholecystitis or biliary ductal dilatation. Pancreas: No mass or inflammatory process visualized on this unenhanced exam. Spleen:  Within normal limits in size. Adrenals/Urinary tract: No evidence of urolithiasis or hydronephrosis. Unremarkable unopacified urinary bladder. Stomach/Bowel: Diverticulosis is seen involving the descending and proximal sigmoid colon. Moderate wall thickening and adjacent inflammatory changes are seen involving the sigmoid colon. There is also wall thickening involving an adjacent small bowel loop in the central pelvis. In addition, there is a small fluid and gas containing collection in the central small bowel mesentery measuring 2.9 x 2.1 cm on image 51/3. No evidence of free intraperitoneal air. No evidence of bowel obstruction. Vascular/Lymphatic: No pathologically enlarged lymph nodes identified. No evidence of abdominal aortic aneurysm. Aortic atherosclerosis. Reproductive: Several calcified uterine fibroids are seen, largest measuring 6 cm. Adnexal regions are unremarkable in appearance. Other:  None. Musculoskeletal:  No suspicious bone lesions identified. IMPRESSION: Inflammatory process involving the mid sigmoid colon and adjacent small bowel loop, with small abscess in the adjacent small bowel mesentery measuring 2.9 cm. This is suspicious for sigmoid diverticulitis with small diverticular abscess, with primary enteritis considered less  likely. Several calcified uterine fibroids, largest measuring 6 cm. Hepatic cirrhosis. Gallbladder sludge versus tiny gallstones. Electronically Signed   By: Earle Gell M.D.   On: 02/27/2018 10:44    Pending Labs Unresulted Labs (From admission, onward)    Start     Ordered   02/28/18 0500  CBC with Differential/Platelet  Tomorrow morning,   R     02/27/18 1709   02/28/18 3818  Basic metabolic panel  Tomorrow morning,   R     02/27/18 1709   02/27/18 1730  Comprehensive metabolic panel  Once,   STAT     02/27/18 1730   02/27/18 1730  Lipase, blood  Once,   STAT     02/27/18 1730   02/27/18 1455  Urinalysis, Routine w reflex microscopic  STAT,   STAT     02/27/18 1455          Vitals/Pain Today's Vitals   02/27/18 1453 02/27/18 1454 02/27/18 1707 02/27/18 1800  BP: (!) 172/65   [name])  197/63  Pulse: 66   60  Resp: 20     Temp: 98.6 F (37 C)     TempSrc: Oral     SpO2: 100%   93%  PainSc:  8  5      Isolation Precautions No active isolations  Medications Medications  allopurinol (ZYLOPRIM) tablet 50 mg (has no administration in time range)  colchicine tablet 0.6 mg (has no administration in time range)  amiodarone (PACERONE) tablet 200 mg (has no administration in time range)  isosorbide-hydrALAZINE (BIDIL) 20-37.5 MG per tablet 1 tablet (has no administration in time range)  carvedilol (COREG) tablet 25 mg (has no administration in time range)  nitroGLYCERIN (NITROSTAT) SL tablet 0.4 mg (has no administration in time range)  sertraline (ZOLOFT) tablet 75 mg (has no administration in time range)  levothyroxine (SYNTHROID, LEVOTHROID) tablet 125 mcg (has no administration in time range)  albuterol (PROVENTIL) (2.5 MG/3ML) 0.083% nebulizer solution 2.5 mg (has no administration in time range)  heparin injection 5,000 Units (has no administration in time range)  morphine 4 MG/ML injection 4 mg (has no administration in time range)  potassium chloride 10 mEq in 100 mL IVPB  (has no administration in time range)  famotidine (PEPCID) IVPB 20 mg premix (has no administration in time range)  ondansetron (ZOFRAN) injection 4 mg (has no administration in time range)  insulin aspart (novoLOG) injection 0-9 Units (has no administration in time range)  hydrALAZINE (APRESOLINE) injection 10 mg (has no administration in time range)  piperacillin-tazobactam (ZOSYN) IVPB 2.25 g (has no administration in time range)  morphine 4 MG/ML injection 4 mg (4 mg Intravenous Given 02/27/18 1617)  ondansetron (ZOFRAN) injection 4 mg (4 mg Intravenous Given 02/27/18 1617)  sodium chloride 0.9 % bolus 500 mL (500 mLs Intravenous New Bag/Given 02/27/18 1614)  cefTRIAXone (ROCEPHIN) 2 g in sodium chloride 0.9 % 100 mL IVPB (2 g Intravenous New Bag/Given 02/27/18 1623)    Mobility walks

## 2018-02-27 NOTE — Consult Note (Signed)
Reason for Consult: Diverticulitis with abscess Referring Physician: Jola Schmidt, MD  Morgan Clay is an 72 y.o. female.  HPI: Patient is a 72 year old female with a history of nonischemic cardiomyopathy with Hx of  EF  25 to 30% in the past.  She has history of PAF and BiV PTVP.  She continues to have issues with orthopnea sleeping on 4 pillows.  Occasional PND, and dyspnea on exertion.   2D echocardiogram today which showed a moderate focal basal hypertrophy.  Systolic function was 55 to 60%.  Mildly calcified aortic valve with moderate regurgitation and mild to moderate mitral regurgitation.  Pulmonary artery pressure was 60 mmHg today   She was seen by cardiology today and complained of abdominal pain that started a week ago, along with frequent loose bowel movements.  She has a hx of gastric ulcer with GI bleed on Eliquis for atrial flutter .  This has been discontinued in the past.   She was sent for a CT scan today after her cardiology evaluation.  This shows inflammatory process involving the mid sigmoid colon adjacent small bowel with a small abscess adjacent to the small bowel measuring 2.9 cm.  Suspicious for sigmoid diverticulitis.  Subsequently referred to the emergency department.  Work-up in the ED shows she is afebrile blood pressure slightly elevated.  Labs yesterday shows a potassium of 3.4, glucose of 109 and a creatinine of 2.21.  WBC shows white count of 7.6, hemoglobin 13.9, hematocrit 42.3.  Been called to see and evaluate.  Past Medical History:  Diagnosis Date  . A-fib ([place])    on Eliquis  . AICD (automatic cardioverter/defibrillator) present 10/06/2015  . Anemia   . Arthritis    "qwhere" (01/03/2018)  . Asthma    reports mild asthma since childhood - had COPD on dx list from prior PCP  . Chronic bronchitis ([place])    "get it most years; not this past year though" (01/03/2018)  . Chronic kidney disease    "? stage;  followed by [facility]; [name]"  (01/03/2018)  . Chronic systolic congestive heart failure ([place])   . DEPRESSION 12/17/2009   Annotation: PHQ-9 score = 14 done on 12/17/2009 Qualifier: Diagnosis of  By: Hassell Done FNP, Tori Milks    . Diverticulosis   . FIBROIDS, UTERUS 03/05/2008  . Glaucoma   . Hepatitis C    HEPATITIS C - s/p treatment with Harvoni, saw hepatology, Dawn Drazek  . History of blood transfusion ~ 11/2017  . Hyperlipemia 12/06/2012  . HYPERTENSION, BENIGN 04/24/2007  . Hypothyroidism   . LBBB (left bundle branch block)   . Lower GI bleeding    "been dealing w/it since 07/2017" (01/03/2018)  . Nonischemic cardiomyopathy ([place])   . OBESITY 05/27/2009  . OBSTRUCTIVE SLEEP APNEA 11/14/2007   no CPAP  . OSTEOPENIA 09/30/2008  . Personal history of other infectious and parasitic disease    Hepatitis B  . Pneumonia    "several times" (01/03/2018)    Past Surgical History:  Procedure Laterality Date  . APPENDECTOMY    . CARDIAC CATHETERIZATION    . CARDIOVERSION N/A 12/14/2014   Procedure: CARDIOVERSION;  Surgeon: Dorothy Spark, MD;  Location: [facility] ENDOSCOPY;  Service: Cardiovascular;  Laterality: N/A;  . CARDIOVERSION N/A 02/26/2017   Procedure: CARDIOVERSION;  Surgeon: Evans Lance, MD;  Location: [place] CV LAB;  Service: Cardiovascular;  Laterality: N/A;  . CARDIOVERSION N/A 07/24/2017   Procedure: CARDIOVERSION;  Surgeon: Thayer Headings, MD;  Location: [place];  Service: Cardiovascular;  Laterality: N/A;  . COLONOSCOPY N/A 11/08/2017   Procedure: COLONOSCOPY;  Surgeon: Milus Banister, MD;  Location: [facility] ENDOSCOPY;  Service: Endoscopy;  Laterality: N/A;  . DILATION AND CURETTAGE OF UTERUS    . EP IMPLANTABLE DEVICE N/A 10/06/2015   Procedure: BIV ICD Generator Changeout;  Surgeon: [name], MD;  Location: [place] CV LAB;  Service: Cardiovascular;  Laterality: N/A;  . ESOPHAGOGASTRODUODENOSCOPY N/A 10/26/2017   Procedure: ESOPHAGOGASTRODUODENOSCOPY (EGD);  Surgeon: Ladene Artist, MD;   Location: [facility] ENDOSCOPY;  Service: Endoscopy;  Laterality: N/A;  . ESOPHAGOGASTRODUODENOSCOPY (EGD) WITH PROPOFOL N/A 11/07/2017   Procedure: ESOPHAGOGASTRODUODENOSCOPY (EGD) WITH PROPOFOL;  Surgeon: Milus Banister, MD;  Location: [facility] ENDOSCOPY;  Service: Endoscopy;  Laterality: N/A;  . HOT HEMOSTASIS N/A 10/26/2017   Procedure: HOT HEMOSTASIS (ARGON PLASMA COAGULATION/BICAP);  Surgeon: Ladene Artist, MD;  Location: [facility] ENDOSCOPY;  Service: Endoscopy;  Laterality: N/A;  . INSERT / REPLACE / REMOVE PACEMAKER  ?2008  . POLYPECTOMY  11/08/2017   Procedure: POLYPECTOMY;  Surgeon: Milus Banister, MD;  Location: [facility] ENDOSCOPY;  Service: Endoscopy;;  . RIGHT HEART CATH N/A 02/13/2018   Procedure: RIGHT HEART CATH;  Surgeon: [name], MD;  Location: [place] CV LAB;  Service: Cardiovascular;  Laterality: N/A;  . TUBAL LIGATION      Family History  Problem Relation Age of Onset  . Asthma Father   . Heart attack Father   . Asthma Sister   . Lung cancer Sister   . Heart attack Mother   . Stroke Brother   . Colon cancer Neg Hx   . Esophageal cancer Neg Hx   . Pancreatic cancer Neg Hx   . Stomach cancer Neg Hx   . Liver disease Neg Hx     Social History:  reports that she has never smoked. She has never used smokeless tobacco. She reports that she drinks alcohol. She reports that she does not use drugs.  Allergies: No Known Allergies  Prior to Admission medications   Medication Sig Start Date End Date Taking? Authorizing Provider  allopurinol (ZYLOPRIM) 100 MG tablet Take 1/2 tablet by mouth daily Patient taking differently: Take 50 mg by mouth daily.  01/29/18  Yes [name], DO  amiodarone (PACERONE) 200 MG tablet Take 200 mg by mouth at bedtime.    Yes [provider]  BIDIL 20-37.5 MG tablet TAKE 1 TABLET BY MOUTH TWICE DAILY Patient taking differently: Take 1 tablet by mouth 2 (two) times daily.  04/26/17  Yes [name], MD  carvedilol (COREG) 25 MG tablet  Take 25 mg by mouth 2 (two) times daily. 12/17/17  Yes [provider]  colchicine 0.6 MG tablet Take 0.6 mg by mouth daily.  02/06/18  Yes [provider]  cyanocobalamin (,VITAMIN B-12,) 1000 MCG/ML injection Inject 1,000 mcg into the muscle every 30 (thirty) days.   Yes [provider]  levothyroxine (SYNTHROID, LEVOTHROID) 125 MCG tablet Take 125 mcg by mouth daily before breakfast.   Yes [provider]  pantoprazole (PROTONIX) 40 MG tablet Take 1 tablet (40 mg total) by mouth daily. 01/05/18  Yes Emokpae, Courage, MD  polyethylene glycol (MIRALAX / GLYCOLAX) packet Take 17 g by mouth 2 (two) times daily. Patient taking differently: Take 17 g by mouth daily as needed for mild constipation.  01/05/18  Yes Emokpae, Courage, MD  potassium chloride 20 MEQ TBCR Take 20 mEq by mouth daily. 02/08/18  Yes Loralie Champagne  S, MD  sertraline (ZOLOFT) 50 MG tablet Take 1.5 tablets (75 mg total) by mouth daily. 01/29/18  Yes [name], DO  torsemide (DEMADEX) 20 MG tablet Take 1 tablet (20 mg total) by mouth daily. 03/02/18  Yes Clegg, [name] D, NP  albuterol (PROVENTIL HFA;VENTOLIN HFA) 108 (90 Base) MCG/ACT inhaler Inhale 2 puffs into the lungs every 6 (six) hours as needed for wheezing or shortness of breath. 01/05/18   Roxan Hockey, MD  dicyclomine (BENTYL) 10 MG capsule Take 1 capsule (10 mg total) by mouth 3 (three) times daily before meals. 02/26/18   Zehr, Laban Emperor, PA-C  nitroGLYCERIN (NITROSTAT) 0.4 MG SL tablet Place 0.4 mg under the tongue every 5 (five) minutes as needed for chest pain.    [provider]  spironolactone (ALDACTONE) 25 MG tablet Take 1 tablet (25 mg total) by mouth daily. Hold until after you repeat BMP blood work on 01/07/2018 Patient not taking: Reported on 02/27/2018 01/05/18   Roxan Hockey, MD     Results for orders placed or performed in visit on 02/26/18 (from the past 48 hour(s))  Basic Metabolic Panel (BMET)     Status:  Abnormal   Collection Time: 02/26/18  3:13 PM  Result Value Ref Range   Sodium 137 135 - 145 mEq/L   Potassium 3.4 (L) 3.5 - 5.1 mEq/L   Chloride 102 96 - 112 mEq/L   CO2 26 19 - 32 mEq/L   Glucose, Bld 109 (H) 70 - 99 mg/dL   BUN 41 (H) 6 - 23 mg/dL   Creatinine, Ser 2.21 (H) 0.40 - 1.20 mg/dL   Calcium 9.9 8.4 - 10.5 mg/dL   GFR 28.22 (L) >60.00 mL/min  CBC w/Diff     Status: Abnormal   Collection Time: 02/26/18  3:13 PM  Result Value Ref Range   WBC 7.6 4.0 - 10.5 K/uL   RBC 4.94 3.87 - 5.11 Mil/uL   Hemoglobin 13.9 12.0 - 15.0 g/dL   HCT 42.3 36.0 - 46.0 %   MCV 85.7 78.0 - 100.0 fl   MCHC 32.7 30.0 - 36.0 g/dL   RDW 18.9 (H) 11.5 - 15.5 %   Platelets 230.0 150.0 - 400.0 K/uL   Neutrophils Relative % 71.2 43.0 - 77.0 %   Lymphocytes Relative 12.7 12.0 - 46.0 %   Monocytes Relative 11.2 3.0 - 12.0 %   Eosinophils Relative 3.8 0.0 - 5.0 %   Basophils Relative 1.1 0.0 - 3.0 %   Neutro Abs 5.4 1.4 - 7.7 K/uL   Lymphs Abs 1.0 0.7 - 4.0 K/uL   Monocytes Absolute 0.8 0.1 - 1.0 K/uL   Eosinophils Absolute 0.3 0.0 - 0.7 K/uL   Basophils Absolute 0.1 0.0 - 0.1 K/uL    Ct Abdomen Pelvis Wo Contrast  Result Date: 02/27/2018 CLINICAL DATA:  Bilateral lower quadrant pain. Chills. Diarrhea. Chronic hepatitis-C. EXAM: CT ABDOMEN AND PELVIS WITHOUT CONTRAST TECHNIQUE: Multidetector CT imaging of the abdomen and pelvis was performed following the standard protocol without IV contrast. COMPARISON:  08/03/2017 FINDINGS: Lower chest: No acute findings. Hepatobiliary: Hepatic cirrhosis again demonstrated. No mass visualized on this unenhanced exam. High attenuation gallbladder sludge or tiny noncalcified gallstones noted. No evidence of cholecystitis or biliary ductal dilatation. Pancreas: No mass or inflammatory process visualized on this unenhanced exam. Spleen:  Within normal limits in size. Adrenals/Urinary tract: No evidence of urolithiasis or hydronephrosis. Unremarkable unopacified urinary  bladder. Stomach/Bowel: Diverticulosis is seen involving the descending and proximal sigmoid colon. Moderate  wall thickening and adjacent inflammatory changes are seen involving the sigmoid colon. There is also wall thickening involving an adjacent small bowel loop in the central pelvis. In addition, there is a small fluid and gas containing collection in the central small bowel mesentery measuring 2.9 x 2.1 cm on image 51/3. No evidence of free intraperitoneal air. No evidence of bowel obstruction. Vascular/Lymphatic: No pathologically enlarged lymph nodes identified. No evidence of abdominal aortic aneurysm. Aortic atherosclerosis. Reproductive: Several calcified uterine fibroids are seen, largest measuring 6 cm. Adnexal regions are unremarkable in appearance. Other:  None. Musculoskeletal:  No suspicious bone lesions identified. IMPRESSION: Inflammatory process involving the mid sigmoid colon and adjacent small bowel loop, with small abscess in the adjacent small bowel mesentery measuring 2.9 cm. This is suspicious for sigmoid diverticulitis with small diverticular abscess, with primary enteritis considered less likely. Several calcified uterine fibroids, largest measuring 6 cm. Hepatic cirrhosis. Gallbladder sludge versus tiny gallstones. Electronically Signed   By: Earle Gell M.D.   On: 02/27/2018 10:44    Review of Systems  Constitutional: Positive for weight loss (8 lbs over the last week, but she has also been on fluid pills ). Negative for chills and fever.  HENT: Negative.   Eyes: Negative.   Respiratory: Positive for cough, sputum production (clear ) and shortness of breath (DOE). Negative for wheezing.   Cardiovascular: Positive for orthopnea, claudication (she gets SOB withshort walks, not sure about claudication), leg swelling and PND.  Gastrointestinal: Positive for abdominal pain (PAIN is in the lower abdomen, not easy to tell if left is more tender than the right.), diarrhea, nausea and  vomiting. Negative for blood in stool, constipation, heartburn and melena.  Genitourinary: Negative.   Musculoskeletal: Negative.   Skin: Negative.   Neurological: Negative.   Endo/Heme/Allergies: Bruises/bleeds easily (on Eliquis last dose 10/22 PM).  Psychiatric/Behavioral: Negative.    Blood pressure (!) 172/65, pulse 66, temperature 98.6 F (37 C), temperature source Oral, resp. rate 20, SpO2 100 %. Physical Exam  Constitutional: She is oriented to person, place, and time. She appears well-developed. No distress.  Painful abdomen on palpation.  He has been having a good deal of pain  Eyes: Right eye exhibits no discharge. Left eye exhibits no discharge. No scleral icterus.  Pupils are equal  Neck: Normal range of motion. Neck supple. No JVD present. No tracheal deviation present. No thyromegaly present.  Cardiovascular: Normal rate, regular rhythm and intact distal pulses.  Murmur heard. Respiratory: Effort normal and breath sounds normal. No respiratory distress. She has no wheezes. She has no rales. She exhibits no tenderness.  GI: Soft. She exhibits no distension and no mass. There is tenderness (pain is in the lower abdomen, she can't really say if left is more tender than the right.). There is no rebound and no guarding.  Musculoskeletal: She exhibits no edema or tenderness.  Lymphadenopathy:    She has no cervical adenopathy.  Neurological: She is alert and oriented to person, place, and time. No cranial nerve deficit.  Skin: Skin is warm and dry. No rash noted. She is not diaphoretic. No erythema. No pallor.  Psychiatric: She has a normal mood and affect. Her behavior is normal. Judgment and thought content normal.    Assessment/Plan: Recurrent diverticulitis with small abscess Ischemic cardiomyopathy - improving EF BI V PTVP Hx of PAF Pulmonary Hypertension - ongoing orthopnea, PND,DOE Hx of AF - off anticoaguation CKD Hypertension Hx of GI bleed/gastric ulcer BMI  28.8  Plan:  Antibiotics,  bowel rest, IV hydration.  We will place her on Zosyn and discontinue Flagyl/Rocephin.  We will follow with you.  She has labs ordered for the AM.  I think the abscess is to small to drain, but we will watch progress.   Vickie Melnik 02/27/2018, 4:27 PM

## 2018-02-27 NOTE — H&P (Signed)
History and Physical    Morgan Clay YHC:[id number] DOB: February 02, 1946 DOA: 02/27/2018  PCP: [name], DO   Patient coming from: Home    Chief Complaint: Pain  HPI: Morgan Clay is a 72 y.o. female with medical history significant of CHF, CKD stage III, type 2 diabetes, hypertension, hyperlipidemia, depression, diverticulitis, depression, OSA who presents to the emergency department from her PCPs office.  Patient reported of having abdominal pain mainly on the left side since last Tuesday.  Patient also reported of having chills at home.  She reported of having loose stools also.  Patient describes the pain is intermittent, cramping and rates as 8/10 in severity.  Also underwent outpatient CT scan as per her PCP which showed sigmoid diverticulitis with 2.9 cm abscess.  Patient was also seen at her cardiologist's office for the follow-up of her CHF. Patient seen and examined the bedside in the emergency department.  She looked comfortable during my evaluation.  Her pain was better after she presented here.  She denied fever, chest pain, shortness of breath, dysuria, nausea, vomiting, headache or palpitations.  ED Course: Started on empiric antibiotics.  Surgery consulted.  Review of Systems: As per HPI otherwise 10 point review of systems negative.    Past Medical History:  Diagnosis Date  . A-fib ([place])    on Eliquis  . AICD (automatic cardioverter/defibrillator) present 10/06/2015  . Anemia   . Arthritis    "qwhere" (01/03/2018)  . Asthma    reports mild asthma since childhood - had COPD on dx list from prior PCP  . Chronic bronchitis ([place])    "get it most years; not this past year though" (01/03/2018)  . Chronic kidney disease    "? stage;  followed by [facility]; [name]" (01/03/2018)  . Chronic systolic congestive heart failure ([place])   . DEPRESSION 12/17/2009   Annotation: PHQ-9 score = 14 done on 12/17/2009 Qualifier: Diagnosis of  By: Hassell Done FNP, Tori Milks      . Diverticulosis   . FIBROIDS, UTERUS 03/05/2008  . Glaucoma   . Hepatitis C    HEPATITIS C - s/p treatment with Harvoni, saw hepatology, Dawn Drazek  . History of blood transfusion ~ 11/2017  . Hyperlipemia 12/06/2012  . HYPERTENSION, BENIGN 04/24/2007  . Hypothyroidism   . LBBB (left bundle branch block)   . Lower GI bleeding    "been dealing w/it since 07/2017" (01/03/2018)  . Nonischemic cardiomyopathy ([place])   . OBESITY 05/27/2009  . OBSTRUCTIVE SLEEP APNEA 11/14/2007   no CPAP  . OSTEOPENIA 09/30/2008  . Personal history of other infectious and parasitic disease    Hepatitis B  . Pneumonia    "several times" (01/03/2018)    Past Surgical History:  Procedure Laterality Date  . APPENDECTOMY    . CARDIAC CATHETERIZATION    . CARDIOVERSION N/A 12/14/2014   Procedure: CARDIOVERSION;  Surgeon: Dorothy Spark, MD;  Location: [facility] ENDOSCOPY;  Service: Cardiovascular;  Laterality: N/A;  . CARDIOVERSION N/A 02/26/2017   Procedure: CARDIOVERSION;  Surgeon: Evans Lance, MD;  Location: [place] CV LAB;  Service: Cardiovascular;  Laterality: N/A;  . CARDIOVERSION N/A 07/24/2017   Procedure: CARDIOVERSION;  Surgeon: Thayer Headings, MD;  Location: [facility] ENDOSCOPY;  Service: Cardiovascular;  Laterality: N/A;  . COLONOSCOPY N/A 11/08/2017   Procedure: COLONOSCOPY;  Surgeon: Milus Banister, MD;  Location: [facility] ENDOSCOPY;  Service: Endoscopy;  Laterality: N/A;  . DILATION AND CURETTAGE OF UTERUS    . EP IMPLANTABLE DEVICE N/A  10/06/2015   Procedure: BIV ICD Generator Changeout;  Surgeon: [name], MD;  Location: [place] CV LAB;  Service: Cardiovascular;  Laterality: N/A;  . ESOPHAGOGASTRODUODENOSCOPY N/A 10/26/2017   Procedure: ESOPHAGOGASTRODUODENOSCOPY (EGD);  Surgeon: Ladene Artist, MD;  Location: [facility] ENDOSCOPY;  Service: Endoscopy;  Laterality: N/A;  . ESOPHAGOGASTRODUODENOSCOPY (EGD) WITH PROPOFOL N/A 11/07/2017   Procedure: ESOPHAGOGASTRODUODENOSCOPY (EGD) WITH PROPOFOL;   Surgeon: Milus Banister, MD;  Location: [facility] ENDOSCOPY;  Service: Endoscopy;  Laterality: N/A;  . HOT HEMOSTASIS N/A 10/26/2017   Procedure: HOT HEMOSTASIS (ARGON PLASMA COAGULATION/BICAP);  Surgeon: Ladene Artist, MD;  Location: [facility] ENDOSCOPY;  Service: Endoscopy;  Laterality: N/A;  . INSERT / REPLACE / REMOVE PACEMAKER  ?2008  . POLYPECTOMY  11/08/2017   Procedure: POLYPECTOMY;  Surgeon: Milus Banister, MD;  Location: [facility] ENDOSCOPY;  Service: Endoscopy;;  . RIGHT HEART CATH N/A 02/13/2018   Procedure: RIGHT HEART CATH;  Surgeon: [name], MD;  Location: [place] CV LAB;  Service: Cardiovascular;  Laterality: N/A;  . TUBAL LIGATION       reports that she has never smoked. She has never used smokeless tobacco. She reports that she drinks alcohol. She reports that she does not use drugs.  No Known Allergies  Family History  Problem Relation Age of Onset  . Asthma Father   . Heart attack Father   . Asthma Sister   . Lung cancer Sister   . Heart attack Mother   . Stroke Brother   . Colon cancer Neg Hx   . Esophageal cancer Neg Hx   . Pancreatic cancer Neg Hx   . Stomach cancer Neg Hx   . Liver disease Neg Hx      Prior to Admission medications   Medication Sig Start Date End Date Taking? Authorizing Provider  allopurinol (ZYLOPRIM) 100 MG tablet Take 1/2 tablet by mouth daily Patient taking differently: Take 50 mg by mouth daily.  01/29/18  Yes [name], DO  amiodarone (PACERONE) 200 MG tablet Take 200 mg by mouth at bedtime.    Yes [provider]  BIDIL 20-37.5 MG tablet TAKE 1 TABLET BY MOUTH TWICE DAILY Patient taking differently: Take 1 tablet by mouth 2 (two) times daily.  04/26/17  Yes [name], MD  carvedilol (COREG) 25 MG tablet Take 25 mg by mouth 2 (two) times daily. 12/17/17  Yes [provider]  colchicine 0.6 MG tablet Take 0.6 mg by mouth daily.  02/06/18  Yes [provider]  cyanocobalamin (,VITAMIN B-12,) 1000 MCG/ML  injection Inject 1,000 mcg into the muscle every 30 (thirty) days.   Yes [provider]  levothyroxine (SYNTHROID, LEVOTHROID) 125 MCG tablet Take 125 mcg by mouth daily before breakfast.   Yes [provider]  pantoprazole (PROTONIX) 40 MG tablet Take 1 tablet (40 mg total) by mouth daily. 01/05/18  Yes Emokpae, Courage, MD  polyethylene glycol (MIRALAX / GLYCOLAX) packet Take 17 g by mouth 2 (two) times daily. Patient taking differently: Take 17 g by mouth daily as needed for mild constipation.  01/05/18  Yes Emokpae, Courage, MD  potassium chloride 20 MEQ TBCR Take 20 mEq by mouth daily. 02/08/18  Yes [name], MD  sertraline (ZOLOFT) 50 MG tablet Take 1.5 tablets (75 mg total) by mouth daily. 01/29/18  Yes [name], DO  torsemide (DEMADEX) 20 MG tablet Take 1 tablet (20 mg total) by mouth daily. 03/02/18  Yes Clegg, [name] D, NP  albuterol (PROVENTIL HFA;VENTOLIN HFA) 108 (90 Base) MCG/ACT inhaler Inhale 2 puffs into the lungs every 6 (six) hours as needed for wheezing or shortness of breath. 01/05/18   Roxan Hockey, MD  dicyclomine (BENTYL) 10 MG capsule Take 1 capsule (10 mg total) by mouth 3 (three) times daily before meals. 02/26/18   Zehr, Laban Emperor, PA-C  nitroGLYCERIN (NITROSTAT) 0.4 MG SL tablet Place 0.4 mg under the tongue every 5 (five) minutes as needed for chest pain.    [provider]    Physical Exam: Vitals:   02/27/18 1453  BP: (!) 172/65  Pulse: 66  Resp: 20  Temp: 98.6 F (37 C)  TempSrc: Oral  SpO2: 100%    Constitutional: NAD, calm, comfortable Vitals:   02/27/18 1453  BP: (!) 172/65  Pulse: 66  Resp: 20  Temp: 98.6 F (37 C)  TempSrc: Oral  SpO2: 100%   Eyes: PERRL, lids and conjunctivae normal ENMT: Mucous membranes are moist. Posterior pharynx clear of any exudate or lesions.Normal dentition.  Neck: normal, supple, no masses, no thyromegaly Respiratory: clear to auscultation bilaterally, no wheezing, no crackles.  Normal respiratory effort. No accessory muscle use.  Cardiovascular: Regular rate and rhythm, no murmurs / rubs / gallops. No extremity edema. 2+ pedal pulses. No carotid bruits.  Abdomen: Mild generalized  Tenderness more on the left lower quadrant, no masses palpated. No hepatosplenomegaly. Bowel sounds positive.  Musculoskeletal: no clubbing / cyanosis. No joint deformity upper and lower extremities. Good ROM, no contractures. Normal muscle tone.  Skin: no rashes, lesions, ulcers. No induration Neurologic: CN 2-12 grossly intact. Sensation intact, DTR normal. Strength 5/5 in all 4.  Psychiatric: Normal judgment and insight. Alert and oriented x 3. Normal mood.   Foley Catheter:None  Labs on Admission: I have personally reviewed following labs and imaging studies  CBC: Recent Labs  Lab 02/26/18 1513 02/27/18 1630  WBC 7.6 8.0  NEUTROABS 5.4  --   HGB 13.9 14.2  HCT 42.3 45.8  MCV 85.7 86.6  PLT 230.0 503   Basic Metabolic Panel: Recent Labs  Lab 02/26/18 1513  NA 137  K 3.4*  CL 102  CO2 26  GLUCOSE 109*  BUN 41*  CREATININE 2.21*  CALCIUM 9.9   GFR: Estimated Creatinine Clearance: 19.4 mL/min (A) (by C-G formula based on SCr of 2.21 mg/dL (H)). Liver Function Tests: No results for input(s): AST, ALT, ALKPHOS, BILITOT, PROT, ALBUMIN in the last 168 hours. No results for input(s): LIPASE, AMYLASE in the last 168 hours. No results for input(s): AMMONIA in the last 168 hours. Coagulation Profile: No results for input(s): INR, PROTIME in the last 168 hours. Cardiac Enzymes: No results for input(s): CKTOTAL, CKMB, CKMBINDEX, TROPONINI in the last 168 hours. BNP (last 3 results) No results for input(s): PROBNP in the last 8760 hours. HbA1C: No results for input(s): HGBA1C in the last 72 hours. CBG: No results for input(s): GLUCAP in the last 168 hours. Lipid Profile: No results for input(s): CHOL, HDL, LDLCALC, TRIG, CHOLHDL, LDLDIRECT in the last 72 hours. Thyroid  Function Tests: No results for input(s): TSH, T4TOTAL, FREET4, T3FREE, THYROIDAB in the last 72 hours. Anemia Panel: No results for input(s): VITAMINB12, FOLATE, FERRITIN, TIBC, IRON, RETICCTPCT in the last 72 hours. Urine analysis:    Component Value Date/Time   COLORURINE YELLOW 08/03/2017 1003   APPEARANCEUR HAZY (A) 08/03/2017 1003   LABSPEC 1.019 08/03/2017 1003   PHURINE 5.0 08/03/2017 1003   GLUCOSEU NEGATIVE 08/03/2017 1003  HGBUR NEGATIVE 08/03/2017 1003   HGBUR negative 02/28/2008 0000   BILIRUBINUR NEGATIVE 08/03/2017 1003   KETONESUR 5 (A) 08/03/2017 1003   PROTEINUR NEGATIVE 08/03/2017 1003   UROBILINOGEN 1.0 07/19/2010 1752   NITRITE NEGATIVE 08/03/2017 1003   LEUKOCYTESUR NEGATIVE 08/03/2017 1003    Radiological Exams on Admission: Ct Abdomen Pelvis Wo Contrast  Result Date: 02/27/2018 CLINICAL DATA:  Bilateral lower quadrant pain. Chills. Diarrhea. Chronic hepatitis-C. EXAM: CT ABDOMEN AND PELVIS WITHOUT CONTRAST TECHNIQUE: Multidetector CT imaging of the abdomen and pelvis was performed following the standard protocol without IV contrast. COMPARISON:  08/03/2017 FINDINGS: Lower chest: No acute findings. Hepatobiliary: Hepatic cirrhosis again demonstrated. No mass visualized on this unenhanced exam. High attenuation gallbladder sludge or tiny noncalcified gallstones noted. No evidence of cholecystitis or biliary ductal dilatation. Pancreas: No mass or inflammatory process visualized on this unenhanced exam. Spleen:  Within normal limits in size. Adrenals/Urinary tract: No evidence of urolithiasis or hydronephrosis. Unremarkable unopacified urinary bladder. Stomach/Bowel: Diverticulosis is seen involving the descending and proximal sigmoid colon. Moderate wall thickening and adjacent inflammatory changes are seen involving the sigmoid colon. There is also wall thickening involving an adjacent small bowel loop in the central pelvis. In addition, there is a small fluid and gas  containing collection in the central small bowel mesentery measuring 2.9 x 2.1 cm on image 51/3. No evidence of free intraperitoneal air. No evidence of bowel obstruction. Vascular/Lymphatic: No pathologically enlarged lymph nodes identified. No evidence of abdominal aortic aneurysm. Aortic atherosclerosis. Reproductive: Several calcified uterine fibroids are seen, largest measuring 6 cm. Adnexal regions are unremarkable in appearance. Other:  None. Musculoskeletal:  No suspicious bone lesions identified. IMPRESSION: Inflammatory process involving the mid sigmoid colon and adjacent small bowel loop, with small abscess in the adjacent small bowel mesentery measuring 2.9 cm. This is suspicious for sigmoid diverticulitis with small diverticular abscess, with primary enteritis considered less likely. Several calcified uterine fibroids, largest measuring 6 cm. Hepatic cirrhosis. Gallbladder sludge versus tiny gallstones. Electronically Signed   By: Earle Gell M.D.   On: 02/27/2018 10:44     Assessment/Plan Principal Problem:   Diverticulitis Active Problems:   Hypothyroidism   OBSTRUCTIVE SLEEP APNEA   Biventricular ICD (implantable cardioverter-defibrillator) in place   Nonischemic cardiomyopathy (H[facility])   Type 2 diabetes, controlled, with renal manifestation ([place])   Chronic kidney disease - followed by [facility]   Atypical atrial flutter ([place])   Pulmonary hypertension ([place]) on echocardiogram    Diverticulitis with diverticular abscess: CT imaging as above.  Continue n.p.o. Status.  Continue pain management.  General surgery following.  Plan for conservative management.  Chronic diastolic CHF: Patient has history of nonischemic cardiomyopathy.  She previously had systolic CHF for which she has biventricular pacemaker with recovery of her EF.  Echocardiogram was done today at the cardiology office which showed ejection fraction of 55-60%, moderate pulmonary hypertension. Currently she is  euvolemic and is not in acute heart failure.  She is on torsemide at home which will be held for now as she is n.p.o.Will not start on  fluids also.   CKD stage III: Follows with nephrology [name].  Currently her kidney function is at baseline.  On torsemide at home which we will hold for now.  Continue monitor kidney function.  Paroxysmal atrial flutter: Follows with cardiology.  On amiodarone and carvedilol.She was on Eliquis in the past which was held due to history of recurrent GI bleed.  She had a history of gastric ulcer and  also multiple small bowel AVMs on capsule endoscopy.  Hypertension: We will continue monitor his blood pressure.  Continue home medications.Continue PRN IV meds.  Gout: On allopurinol and colchicine.    Depression: Continue her home medication Zoloft.  Hypothyroidism: Continue Synthyroid at home dose.  Obstructive sleep apnea: Continue CPAP.  Diabetes type 2: Diet controlled.  Continue sliding scale insulin here.  Hypokalemia: Supplemented   Severity of Illness: The appropriate patient status for this patient is INPATIENT.   DVT prophylaxis: heparin [place] Code Status: Full Family Communication: None present at the bedside Consults called: General surgery     Shelly Coss MD [facility] Pager [phone]  If 7PM-7AM, please contact night-coverage [email or web address] Password [email or web address]  02/27/2018, 5:25 PM

## 2018-02-27 NOTE — Progress Notes (Signed)
PCP: Dr. [name] Cardiology: Dr. Caryl Comes HF Cardiology: Dr. [name]  72 yo with history of nonischemic cardiomyopathy with recovery of EF with BiV pacing, recurrent GI bleeding, and paroxysmal atrial flutter was referred by Dr. Caryl Comes for evaluation of CHF.  Patient has a history of presumed nonischemic cardiomyopathy for a number of years.  Echo in 12/15 showed EF 25-30% and Cardiolite at that time showed no ischemia.  She had Medtronic CRT-D system placed, and EF recovered. In 4/19, echo showed EF 60-65%.  Despite recovery of LV systolic function, she has remained short of breath with exertion.  This has been worse for a number of months now.  She has gained 10 lbs recently.  She has orthopnea, sleeping on 4 pillows. She is short of breath walking short distances around her house.  She has abdominal swelling.  Occasional PND. Occasional lightheadedness if she stands too fast.  Her chest will feel heavy with exertion (when she gets short of breath).  No syncope.  I checked her CRT-D device today.  She is BiV pacing appropriately (>99% of the time) but has volume overload by impedance monitoring.   She has additionally had problems with GI bleeding.  She had been on Eliquis for atrial flutter but this was stopped with recurrent GI bleeding.  She had bleeding from a gastric ulcer but was also noted to have multiple small bowel AVMs on capsule endoscopy out of reach of endoscope.  Eliquis was stopped. She has had no melena or BRBPR off Eliquis.   Today she returns for HF follow up. Overall feeling bad. Having abdominal pain for the last week. Frequent bowel movements at least 10 a day. Yesterday she was evaluated by GI and sent for CT.  Denies SOB/PND/Orthopnea. Complaining of dizziness when she stands. Appetite poor.. No fever or chills. Weight at home has been stable. He did not take medications this morning.   ECG (7/19): NSR with BiV pacing (personally reviewed)  Labs (9/19):  Hgb 10.2, K 3.8, creatinine  2.5 Labs (02/26/2018): K 3.4 Creatinine 2.2 WBC 76.   PMH: 1. Diabetes: diet-controlled.  2. Gout 3. HCV: Treated with Harvoni.  4. Atrial flutter: Atypical.  Had DCCV in 3/19, maintained on amiodarone.  5. HTN 6. CKD stage 3: Sees nephrology, [name].  7. H/o SBO 8. Obesity 9. Nonischemic cardiomyopathy: Echo in 12/15 with EF 25-30%.  Cardiolite in 12/15 with no ischemia.  She had a Medtronic CRT-D device placed with recovery of EF.   - Echo (4/19): EF 60-65% with moderate AI, moderate MR, and PASP 49 mmHg.  - RHC 02/13/2018 RA mean 6, RV 49/7,PA 50/18, mean 31,PCWP mean 20, Oxygen saturations:PA 67% AO 97% Cardiac Output (Fick) 4.6 Cardiac Index (Fick) 2.9 PVR 2.4 WU 10. GI bleeding: Gastric AVM and also gastric ulcer seen in past.  - 8/19 capsule endoscopy showed small bowel AVMs beyond the reach of endoscopy.  - With recurrent episodes of GI bleeding, Eliquis was stopped.  11. Fe deficiency anemia.  12. Depression 13. OSA: Uses CPAP.   Social History   Socioeconomic History  . Marital status: Divorced    Spouse name: Not on file  . Number of children: Not on file  . Years of education: Not on file  . Highest education level: Not on file  Occupational History  . Not on file  Social Needs  . Financial resource strain: Not on file  . Food insecurity:    Worry: Not on file    Inability:  Not on file  . Transportation needs:    Medical: Not on file    Non-medical: Not on file  Tobacco Use  . Smoking status: Never Smoker  . Smokeless tobacco: Never Used  Substance and Sexual Activity  . Alcohol use: Yes    Comment: 01/03/2018 "couple drinks/month"  . Drug use: No  . Sexual activity: Not Currently  Lifestyle  . Physical activity:    Days per week: Not on file    Minutes per session: Not on file  . Stress: Not on file  Relationships  . Social connections:    Talks on phone: Not on file    Gets together: Not on file    Attends religious service: Not on file     Active member of club or organization: Not on file    Attends meetings of clubs or organizations: Not on file    Relationship status: Not on file  . Intimate partner violence:    Fear of current or ex partner: Not on file    Emotionally abused: Not on file    Physically abused: Not on file    Forced sexual activity: Not on file  Other Topics Concern  . Not on file  Social History Narrative   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      Home Situation: lives alone      Spiritual Beliefs: Christian - [employer]      Lifestyle: no regular exercise; poor diet      She is divorced at least one adult child   Never smoker    rare alcohol to occasional at best    no substance abuse         Family History  Problem Relation Age of Onset  . Asthma Father   . Heart attack Father   . Asthma Sister   . Lung cancer Sister   . Heart attack Mother   . Stroke Brother   . Colon cancer Neg Hx   . Esophageal cancer Neg Hx   . Pancreatic cancer Neg Hx   . Stomach cancer Neg Hx   . Liver disease Neg Hx    ROS: All systems reviewed and negative except as per HPI.   Current Outpatient Medications  Medication Sig Dispense Refill  . albuterol (PROVENTIL HFA;VENTOLIN HFA) 108 (90 Base) MCG/ACT inhaler Inhale 2 puffs into the lungs every 6 (six) hours as needed for wheezing or shortness of breath. 1 Inhaler 3  . allopurinol (ZYLOPRIM) 100 MG tablet Take 1/2 tablet by mouth daily (Patient taking differently: Take 50 mg by mouth daily. ) 15 tablet 1  . amiodarone (PACERONE) 200 MG tablet Take 200 mg by mouth at bedtime.     . carvedilol (COREG) 25 MG tablet Take 25 mg by mouth 2 (two) times daily.  3  . colchicine 0.6 MG tablet Take 0.6 mg by mouth daily as needed. Gout flare ups  0  . cyanocobalamin (,VITAMIN B-12,) 1000 MCG/ML injection Inject 1,000 mcg into the muscle every 30 (thirty) days.    [name] dicyclomine (BENTYL) 10 MG capsule Take 1 capsule (10 mg total) by mouth 3 (three) times daily  before meals. 30 capsule 0  . levothyroxine (SYNTHROID, LEVOTHROID) 125 MCG tablet Take 125 mcg by mouth daily before breakfast.    . nitroGLYCERIN (NITROSTAT) 0.4 MG SL tablet Place 0.4 mg under the tongue every 5 (five) minutes as needed for chest pain.    . pantoprazole (PROTONIX) 40 MG tablet Take 1  tablet (40 mg total) by mouth daily. 30 tablet 4  . polyethylene glycol (MIRALAX / GLYCOLAX) packet Take 17 g by mouth 2 (two) times daily. 60 each 2  . potassium chloride 20 MEQ TBCR Take 20 mEq by mouth daily. 30 tablet 3  . sertraline (ZOLOFT) 50 MG tablet Take 1.5 tablets (75 mg total) by mouth daily. 135 tablet 3  . spironolactone (ALDACTONE) 25 MG tablet Take 1 tablet (25 mg total) by mouth daily. Hold until after you repeat BMP blood work on 01/07/2018 10 tablet 0  . torsemide (DEMADEX) 20 MG tablet Take 2 tablets (40 mg total) by mouth daily. 90 tablet 3  . BIDIL 20-37.5 MG tablet TAKE 1 TABLET BY MOUTH TWICE DAILY (Patient taking differently: Take 1 tablet by mouth 2 (two) times daily. ) 180 tablet 3   No current facility-administered medications for this encounter.    BP (!) 168/72   Pulse 60   Wt 64.7 kg (142 lb 9.6 oz)   SpO2 98%   BMI 28.80 kg/m   Wt Readings from Last 3 Encounters:  02/27/18 64.7 kg (142 lb 9.6 oz)  02/26/18 65.1 kg (143 lb 9.6 oz)  02/13/18 64.9 kg (143 lb)   General:  Appears chronically ill.  No resp difficulty. Arrived in a wheel chair.  HEENT: normal Neck: supple. no JVD. Carotids 2+ bilat; no bruits. No lymphadenopathy or thryomegaly appreciated. Cor: PMI nondisplaced. Regular rate & rhythm. No rubs, gallops or murmurs. Lungs: clear Abdomen: soft, nontender, nondistended. No hepatosplenomegaly. No bruits or masses. Good bowel sounds. Extremities: no cyanosis, clubbing, rash, edema Neuro: alert & orientedx3, cranial nerves grossly intact. moves all 4 extremities w/o difficulty. Affect pleasant  Assessment/Plan: 1. Chronic diastolic CHF: Patient has  history of presumed nonischemic cardiomyopathy (Cardiolite with no ischemia, cath not done with CKD).  However, most recent echo in 4/19 showed EF improved to 60-65% with BiV pacing. She does not appear volume overloaded and seems a little dry today in the setting frequent loose stools.  Hold potassium and lasix for 2 days then she will start torsemide 20 mg daily. ECHo results pending.   2. Atrial flutter: History of atypical flutter, 3/19 DCCV.   -Regular on exam.  - Off Eliquis due to GI bleeding.  - Continues on amiodarone.   3. CKD: Stage 3, followed by [name].   I reviewed creatinine from 10/22. Creatinine was 2.2 which is stable for her. .  4. GI bleeding: has had gastric ulcer and gastric AVMs as well as small bowel AVMs.  She is now off Eliquis. Followed by GI.   5. Hypertension Elevated. She did not take medications this morning  Continue current regimen.  6. Abdominal Pain Saw GI 10/22 with CT scan. Results pending. She was instructed to present to the ED if abdominal pain gets worse.   We will call ECHO  result later today.   Follow up with Dr [name] in 2 months.    [name] Clegg NP-C  02/27/2018

## 2018-02-27 NOTE — Addendum Note (Signed)
Encounter addended by: Jorge Ny, LCSW on: 02/27/2018 1:41 PM  Actions taken: Visit Navigator Flowsheet section accepted, Sign clinical note

## 2018-02-27 NOTE — Progress Notes (Signed)
  Echocardiogram 2D Echocardiogram has been performed.  Morgan Clay 02/27/2018, 11:36 AM

## 2018-02-27 NOTE — Progress Notes (Signed)
Pt refused CPAP qhs.  Pt does not wear one at home and does not want to wear one while in the hospital.  Education provided and Pt encouraged to contact RT should she change her mind.

## 2018-02-27 NOTE — Progress Notes (Signed)
Patient completed SDOH screening while in clinic and reported that she sometimes has concerns about having enough food each month.  Patient states that she has utilized food pantries in the past when she is out of food and is comfortable doing this if needed but would like more resources- CSW mailing list of local pantries to patient.  CSW will continue to follow in clinic and assist as needed  Jorge Ny, [place] Worker [facility] [phone]

## 2018-02-27 NOTE — ED Triage Notes (Signed)
Patient reports she was sent from PCP after CT results showed diverticulitis with abscess. States sent for admission and IV abx. Reports generalized abdominal pain and diarrhea x1 week.

## 2018-02-27 NOTE — ED Notes (Signed)
ED Provider at bedside. 

## 2018-02-27 NOTE — ED Notes (Signed)
Pt has been informed that urine collection is needed

## 2018-02-27 NOTE — Progress Notes (Signed)
Pharmacy Antibiotic Note  Morgan Clay is a 72 y.o. female with a h/o CKD III admitted on 02/27/2018 with recurrent diverticulitis with abscess.  Pharmacy has been consulted for Zosyn dosing.  Initial doses of ceftriaxone and metronidazole in ED. Patient appears to be high-risk.  Plan: Will go ahead and start Zosyn 2.25 g q 8 hours for additional coverage of Pseudomonas due to age, co morbidities, and risk for antimicrobial-resistant infections.     Temp (24hrs), Avg:98.6 F (37 C), Min:98.6 F (37 C), Max:98.6 F (37 C)  Recent Labs  Lab 02/26/18 1513 02/27/18 1630  WBC 7.6 8.0  CREATININE 2.21*  --     Estimated Creatinine Clearance: 19.4 mL/min (A) (by C-G formula based on SCr of 2.21 mg/dL (H)).    No Known Allergies  Antimicrobials this admission: Ceftriaxone and metronidazole 10/23  Zosyn 10/23 >>   Dose adjustments this admission:   Microbiology results:   Thank you for allowing pharmacy to be a part of this patient's care.  Ulice Dash D 02/27/2018 6:08 PM

## 2018-02-28 ENCOUNTER — Ambulatory Visit: Payer: Medicare Other | Admitting: Family Medicine

## 2018-02-28 ENCOUNTER — Ambulatory Visit (HOSPITAL_COMMUNITY): Payer: Medicare Other

## 2018-02-28 DIAGNOSIS — Z0289 Encounter for other administrative examinations: Secondary | ICD-10-CM

## 2018-02-28 LAB — CBC WITH DIFFERENTIAL/PLATELET
ABS IMMATURE GRANULOCYTES: 0.06 10*3/uL (ref 0.00–0.07)
Basophils Absolute: 0 10*3/uL (ref 0.0–0.1)
Basophils Relative: 0 %
Eosinophils Absolute: 0.2 10*3/uL (ref 0.0–0.5)
Eosinophils Relative: 2 %
HCT: 42 % (ref 36.0–46.0)
HEMOGLOBIN: 12.7 g/dL (ref 12.0–15.0)
Immature Granulocytes: 1 %
LYMPHS PCT: 10 %
Lymphs Abs: 0.7 10*3/uL (ref 0.7–4.0)
MCH: 26.7 pg (ref 26.0–34.0)
MCHC: 30.2 g/dL (ref 30.0–36.0)
MCV: 88.2 fL (ref 80.0–100.0)
MONO ABS: 0.8 10*3/uL (ref 0.1–1.0)
Monocytes Relative: 12 %
NEUTROS ABS: 5.3 10*3/uL (ref 1.7–7.7)
Neutrophils Relative %: 75 %
Platelets: 276 10*3/uL (ref 150–400)
RBC: 4.76 MIL/uL (ref 3.87–5.11)
RDW: 18.2 % — ABNORMAL HIGH (ref 11.5–15.5)
WBC: 7.1 10*3/uL (ref 4.0–10.5)
nRBC: 0 % (ref 0.0–0.2)

## 2018-02-28 LAB — BASIC METABOLIC PANEL
Anion gap: 12 (ref 5–15)
BUN: 35 mg/dL — ABNORMAL HIGH (ref 8–23)
CALCIUM: 8.9 mg/dL (ref 8.9–10.3)
CHLORIDE: 107 mmol/L (ref 98–111)
CO2: 21 mmol/L — AB (ref 22–32)
CREATININE: 2.09 mg/dL — AB (ref 0.44–1.00)
GFR calc non Af Amer: 23 mL/min — ABNORMAL LOW (ref >60)
GFR, EST AFRICAN AMERICAN: 26 mL/min — AB (ref >60)
GLUCOSE: 113 mg/dL — AB (ref 70–99)
Potassium: 3.5 mmol/L (ref 3.5–5.1)
Sodium: 140 mmol/L (ref 135–145)

## 2018-02-28 LAB — URINALYSIS, ROUTINE W REFLEX MICROSCOPIC
Bilirubin Urine: NEGATIVE
Glucose, UA: NEGATIVE mg/dL
Hgb urine dipstick: NEGATIVE
Ketones, ur: 5 mg/dL — AB
Leukocytes, UA: NEGATIVE
Nitrite: NEGATIVE
Protein, ur: 100 mg/dL — AB
SPECIFIC GRAVITY, URINE: 1.017 (ref 1.005–1.030)
pH: 5 (ref 5.0–8.0)

## 2018-02-28 LAB — GLUCOSE, CAPILLARY
GLUCOSE-CAPILLARY: 100 mg/dL — AB (ref 70–99)
Glucose-Capillary: 101 mg/dL — ABNORMAL HIGH (ref 70–99)
Glucose-Capillary: 140 mg/dL — ABNORMAL HIGH (ref 70–99)
Glucose-Capillary: 95 mg/dL (ref 70–99)

## 2018-02-28 MED ORDER — PANTOPRAZOLE SODIUM 40 MG PO TBEC
40.0000 mg | DELAYED_RELEASE_TABLET | Freq: Every day | ORAL | Status: DC
  Administered 2018-03-01 – 2018-03-06 (×5): 40 mg via ORAL
  Filled 2018-02-28 (×5): qty 1

## 2018-02-28 MED ORDER — PIPERACILLIN-TAZOBACTAM 3.375 G IVPB
3.3750 g | Freq: Three times a day (TID) | INTRAVENOUS | Status: DC
  Administered 2018-02-28 – 2018-03-02 (×6): 3.375 g via INTRAVENOUS
  Filled 2018-02-28 (×7): qty 50

## 2018-02-28 MED ORDER — ALLOPURINOL 100 MG PO TABS
100.0000 mg | ORAL_TABLET | Freq: Every day | ORAL | Status: DC
  Administered 2018-03-01 – 2018-03-03 (×3): 100 mg via ORAL
  Filled 2018-02-28 (×3): qty 1

## 2018-02-28 MED ORDER — COLCHICINE 0.6 MG PO TABS
0.6000 mg | ORAL_TABLET | Freq: Every day | ORAL | Status: DC | PRN
  Filled 2018-02-28: qty 1

## 2018-02-28 NOTE — Progress Notes (Signed)
Pharmacy Antibiotic Note  Morgan Clay is a 72 y.o. female with a h/o CKD III admitted on 02/27/2018 with recurrent diverticulitis with abscess.  Pharmacy has been consulted for Zosyn dosing.  Initial doses of ceftriaxone and metronidazole in ED. Patient appears to be high-risk.  Hx CKD, Scr improved today.  CrCl~43ml/min  Plan: Increase Zosyn 3.375gm IV Q8h to be infused over 4hrs F/U renal function     Temp (24hrs), Avg:98.2 F (36.8 C), Min:97.9 F (36.6 C), Max:98.6 F (37 C)  Recent Labs  Lab 02/26/18 1513 02/27/18 1630 02/27/18 1730 02/28/18 0549  WBC 7.6 8.0  --  7.1  CREATININE 2.21*  --  2.26* 2.09*    Estimated Creatinine Clearance: 20.5 mL/min (A) (by C-G formula based on SCr of 2.09 mg/dL (H)).    No Known Allergies  Antimicrobials this admission: Ceftriaxone and metronidazole 10/23  Zosyn 10/23 >>   Dose adjustments this admission: 10/24: Increase Zosyn 3.375gm IV Q8h to be infused over 4hrs  Microbiology results: none  Thank you for allowing pharmacy to be a part of this patient's care.  Biagio Borg 02/28/2018 10:39 AM

## 2018-02-28 NOTE — Progress Notes (Signed)
Pt continues to refuse cpap.  Pt states that she does not wear it at home and does not want to wear it while here in the hospital.  Cpap rx changed to prn.  Pt was advised that RT is available all night should she change her mind.

## 2018-02-28 NOTE — Progress Notes (Signed)
PROGRESS NOTE    Morgan Clay  FTD:[id number] DOB: 04-30-1946 DOA: 02/27/2018 PCP: [name], DO   Brief Narrative: Morgan Clay is a 72 y.o. female with medical history significant of CHF, CKD stage III, type 2 diabetes, hypertension, hyperlipidemia, depression, diverticulitis, depression, OSA who presents to the emergency department from her PCPs office.  Patient reported of having abdominal pain mainly on the left side since a week.  Patient also reported of having chills at home and loose stools .She  underwent outpatient CT scan as per her PCP which showed sigmoid diverticulitis with 2.9 cm abscess.Surgery consulted.  Assessment & Plan:   Principal Problem:   Diverticulitis Active Problems:   Hypothyroidism   OBSTRUCTIVE SLEEP APNEA   Biventricular ICD (implantable cardioverter-defibrillator) in place   Nonischemic cardiomyopathy (HCC)   Type 2 diabetes, controlled, with renal manifestation ([place])   Chronic kidney disease - followed by [facility]   Atypical atrial flutter ([place])   Pulmonary hypertension ([place]) on echocardiogram   Diverticulitis with diverticular abscess: CT imaging as above. Continue pain management.  General surgery following.  Plan for conservative management.Started on clear liquid diet today.  Chronic diastolic CHF: Patient has history of nonischemic cardiomyopathy.  She previously had systolic CHF for which she has biventricular pacemaker with recovery of her EF.  Echocardiogram was done  at the cardiology office which showed ejection fraction of 55-60%, moderate pulmonary hypertension. Currently she is euvolemic and is not in acute heart failure.  She is on torsemide at home which will be held for now .Will not start on  fluids also.   CKD stage III: Follows with nephrology [name].  Currently her kidney function is at baseline.  On torsemide at home which we will hold for now.  Continue monitor kidney function.  Paroxysmal atrial  flutter: Follows with cardiology.  On amiodarone and carvedilol.She was on Eliquis in the past which was held due to history of recurrent GI bleed.  She had a history of gastric ulcer and also multiple small bowel AVMs on capsule endoscopy.  Hypertension: We will continue monitor his blood pressure.  Continue home medications.Continue PRN IV meds.  Gout: On allopurinol and colchicine.    Depression: Continue her home medication Zoloft.  Hypothyroidism: Continue Synthyroid at home dose.  Obstructive sleep apnea: Doesnot use CPAP at home.  Diabetes type 2: Diet controlled.  Continue sliding scale insulin here.  Hypokalemia: Supplemented    DVT prophylaxis: Heparin [place] Code Status: Full Family Communication: None present at the bed side Disposition Plan: Home after improvement( tolerance of diet and resolution of pain)   Consultants: General surgery  Procedures: None  Antimicrobials: Zosyn  Subjective: Patient seen and examined the bedside this morning.  Still complains of abdominal pain but is better than yesterday.  Wants to eat  Objective: Vitals:   02/27/18 1800 02/27/18 1847 02/27/18 1900 02/27/18 1951  BP: (!) 197/63 (!) 183/55 (!) 167/51 (!) 167/57  Pulse: 60 60 60 61  Resp:  (!) 23 (!) 21 18  Temp:    97.9 F (36.6 C)  TempSrc:    Oral  SpO2: 93% 93% 95% 99%    Intake/Output Summary (Last 24 hours) at 02/28/2018 0911 Last data filed at 02/28/2018 0308 Gross per 24 hour  Intake 187.54 ml  Output -  Net 187.54 ml   There were no vitals filed for this visit.  Examination:  General exam: Appears calm and comfortable ,Not in distress,average built HEENT:PERRL,Oral mucosa moist, Ear/Nose normal on  gross exam Respiratory system: Bilateral equal air entry, normal vesicular breath sounds, no wheezes or crackles  Cardiovascular system: S1 & S2 heard, RRR. No JVD, murmurs, rubs, gallops or clicks. No pedal edema. Gastrointestinal system: Abdomen is  nondistended, soft , mild generalized tenderness more on the left lower quadrant.  No organomegaly or masses felt. Normal bowel sounds heard. Central nervous system: Alert and oriented. No focal neurological deficits. Extremities: No edema, no clubbing ,no cyanosis, distal peripheral pulses palpable. Skin: No rashes, lesions or ulcers,no icterus ,no pallor MSK: Normal muscle bulk,tone ,power Psychiatry: Judgement and insight appear normal. Mood & affect appropriate.     Data Reviewed: I have personally reviewed following labs and imaging studies  CBC: Recent Labs  Lab 02/26/18 1513 02/27/18 1630 02/28/18 0549  WBC 7.6 8.0 7.1  NEUTROABS 5.4  --  5.3  HGB 13.9 14.2 12.7  HCT 42.3 45.8 42.0  MCV 85.7 86.6 88.2  PLT 230.0 296 154   Basic Metabolic Panel: Recent Labs  Lab 02/26/18 1513 02/27/18 1730 02/28/18 0549  NA 137 136 140  K 3.4* 5.9* 3.5  CL 102 101 107  CO2 26 20* 21*  GLUCOSE 109* 111* 113*  BUN 41* 38* 35*  CREATININE 2.21* 2.26* 2.09*  CALCIUM 9.9 8.6* 8.9   GFR: Estimated Creatinine Clearance: 20.5 mL/min (A) (by C-G formula based on SCr of 2.09 mg/dL (H)). Liver Function Tests: Recent Labs  Lab 02/27/18 1730  AST 56*  ALT 30  ALKPHOS 80  BILITOT 1.8*  PROT 8.2*  ALBUMIN 3.7   Recent Labs  Lab 02/27/18 1730  LIPASE 39   No results for input(s): AMMONIA in the last 168 hours. Coagulation Profile: No results for input(s): INR, PROTIME in the last 168 hours. Cardiac Enzymes: No results for input(s): CKTOTAL, CKMB, CKMBINDEX, TROPONINI in the last 168 hours. BNP (last 3 results) No results for input(s): PROBNP in the last 8760 hours. HbA1C: No results for input(s): HGBA1C in the last 72 hours. CBG: Recent Labs  Lab 02/27/18 2010 02/28/18 0711  GLUCAP 102* 101*   Lipid Profile: No results for input(s): CHOL, HDL, LDLCALC, TRIG, CHOLHDL, LDLDIRECT in the last 72 hours. Thyroid Function Tests: No results for input(s): TSH, T4TOTAL, FREET4,  T3FREE, THYROIDAB in the last 72 hours. Anemia Panel: No results for input(s): VITAMINB12, FOLATE, FERRITIN, TIBC, IRON, RETICCTPCT in the last 72 hours. Sepsis Labs: No results for input(s): PROCALCITON, LATICACIDVEN in the last 168 hours.  No results found for this or any previous visit (from the past 240 hour(s)).       Radiology Studies: Ct Abdomen Pelvis Wo Contrast  Result Date: 02/27/2018 CLINICAL DATA:  Bilateral lower quadrant pain. Chills. Diarrhea. Chronic hepatitis-C. EXAM: CT ABDOMEN AND PELVIS WITHOUT CONTRAST TECHNIQUE: Multidetector CT imaging of the abdomen and pelvis was performed following the standard protocol without IV contrast. COMPARISON:  08/03/2017 FINDINGS: Lower chest: No acute findings. Hepatobiliary: Hepatic cirrhosis again demonstrated. No mass visualized on this unenhanced exam. High attenuation gallbladder sludge or tiny noncalcified gallstones noted. No evidence of cholecystitis or biliary ductal dilatation. Pancreas: No mass or inflammatory process visualized on this unenhanced exam. Spleen:  Within normal limits in size. Adrenals/Urinary tract: No evidence of urolithiasis or hydronephrosis. Unremarkable unopacified urinary bladder. Stomach/Bowel: Diverticulosis is seen involving the descending and proximal sigmoid colon. Moderate wall thickening and adjacent inflammatory changes are seen involving the sigmoid colon. There is also wall thickening involving an adjacent small bowel loop in the central pelvis. In addition, there  is a small fluid and gas containing collection in the central small bowel mesentery measuring 2.9 x 2.1 cm on image 51/3. No evidence of free intraperitoneal air. No evidence of bowel obstruction. Vascular/Lymphatic: No pathologically enlarged lymph nodes identified. No evidence of abdominal aortic aneurysm. Aortic atherosclerosis. Reproductive: Several calcified uterine fibroids are seen, largest measuring 6 cm. Adnexal regions are  unremarkable in appearance. Other:  None. Musculoskeletal:  No suspicious bone lesions identified. IMPRESSION: Inflammatory process involving the mid sigmoid colon and adjacent small bowel loop, with small abscess in the adjacent small bowel mesentery measuring 2.9 cm. This is suspicious for sigmoid diverticulitis with small diverticular abscess, with primary enteritis considered less likely. Several calcified uterine fibroids, largest measuring 6 cm. Hepatic cirrhosis. Gallbladder sludge versus tiny gallstones. Electronically Signed   By: Earle Gell M.D.   On: 02/27/2018 10:44        Scheduled Meds: . allopurinol  50 mg Oral Daily  . amiodarone  200 mg Oral QHS  . carvedilol  25 mg Oral BID  . colchicine  0.6 mg Oral Daily  . heparin  5,000 Units Subcutaneous Q8H  . insulin aspart  0-9 Units Subcutaneous TID WC  . isosorbide-hydrALAZINE  1 tablet Oral BID  . levothyroxine  125 mcg Oral QAC breakfast  . sertraline  75 mg Oral Daily   Continuous Infusions: . sodium chloride 10 mL/hr at 02/28/18 0308  . famotidine (PEPCID) IV Stopped (02/27/18 2153)  . piperacillin-tazobactam (ZOSYN)  IV Stopped (02/28/18 0227)     LOS: 1 day    Time spent:25 mins. More than 50% of that time was spent in counseling and/or coordination of care.      Shelly Coss, MD [facility] Pager [phone]  If 7PM-7AM, please contact night-coverage [email or web address] Password [email or web address] 02/28/2018, 9:11 AM

## 2018-02-28 NOTE — Progress Notes (Signed)
CC: Abdominal pain  Subjective: She appears to be in less discomfort this a.m. than last night.  She says the pain waxes and wanes.  She certainly does not look acutely ill.  Objective: Vital signs in last 24 hours: Temp:  [97.9 F (36.6 C)-98.6 F (37 C)] 98.1 F (36.7 C) (10/24 0546) Pulse Rate:  [60-66] 60 (10/24 0546) Resp:  [17-23] 17 (10/24 0546) BP: (118-197)/(51-72) 118/54 (10/24 0546) SpO2:  [93 %-100 %] 95 % (10/24 0546) Weight:  [64.7 kg] 64.7 kg (10/23 1201) Last BM Date: 02/27/18 187 IV recorded nothing else recorded Afebrile, vital signs are stable. BMP shows creatinine stable at 2.09. Potassium 3.5. WBC 7.1 CT scan 10/23: Inflammatory process mid sigmoid colon adjacent to the small bowel loop with small abscess measuring 2.9 cm.  Several calcified uterine fibroids the largest being 6 cm. Hepatic cirrhosis Gallbladder with sludge versus tiny gallstones.   Intake/Output from previous day: 10/23 0701 - 10/24 0700 In: 187.5 [I.V.:45.2; IV Piggyback:142.4] Out: -  Intake/Output this shift: No intake/output data recorded.  General appearance: alert, cooperative and no distress GI: Soft some tenderness, this morning she said it was more above the umbilicus but then when I pressed on her lower abdomen it was there also.  Positive bowel sounds.  Lab Results:  Recent Labs    02/27/18 1630 02/28/18 0549  WBC 8.0 7.1  HGB 14.2 12.7  HCT 45.8 42.0  PLT 296 276    BMET Recent Labs    02/27/18 1730 02/28/18 0549  NA 136 140  K 5.9* 3.5  CL 101 107  CO2 20* 21*  GLUCOSE 111* 113*  BUN 38* 35*  CREATININE 2.26* 2.09*  CALCIUM 8.6* 8.9   PT/INR No results for input(s): LABPROT, INR in the last 72 hours.  Recent Labs  Lab 02/27/18 1730  AST 56*  ALT 30  ALKPHOS 80  BILITOT 1.8*  PROT 8.2*  ALBUMIN 3.7     Lipase     Component Value Date/Time   LIPASE 39 02/27/2018 1730     Medications: . allopurinol  50 mg Oral Daily  .  amiodarone  200 mg Oral QHS  . carvedilol  25 mg Oral BID  . colchicine  0.6 mg Oral Daily  . heparin  5,000 Units Subcutaneous Q8H  . insulin aspart  0-9 Units Subcutaneous TID WC  . isosorbide-hydrALAZINE  1 tablet Oral BID  . levothyroxine  125 mcg Oral QAC breakfast  . sertraline  75 mg Oral Daily   . sodium chloride 10 mL/hr at 02/28/18 0308  . famotidine (PEPCID) IV Stopped (02/27/18 2153)  . piperacillin-tazobactam (ZOSYN)  IV Stopped (02/28/18 0227)   Anti-infectives (From admission, onward)   Start     Dose/Rate Route Frequency Ordered Stop   02/27/18 2315  metroNIDAZOLE (FLAGYL) IVPB 500 mg  Status:  Discontinued     500 mg 100 mL/hr over 60 Minutes Intravenous Every 8 hours 02/27/18 1709 02/27/18 1748   02/27/18 1845  piperacillin-tazobactam (ZOSYN) IVPB 2.25 g     2.25 g 100 mL/hr over 30 Minutes Intravenous Every 8 hours 02/27/18 1805     02/27/18 1715  cefTRIAXone (ROCEPHIN) 1 g in sodium chloride 0.9 % 100 mL IVPB  Status:  Discontinued     1 g 200 mL/hr over 30 Minutes Intravenous Every 24 hours 02/27/18 1709 02/27/18 1748   02/27/18 1615  cefTRIAXone (ROCEPHIN) 2 g in sodium chloride 0.9 % 100 mL IVPB  2 g 200 mL/hr over 30 Minutes Intravenous  Once 02/27/18 1600 02/27/18 1653   02/27/18 1615  metroNIDAZOLE (FLAGYL) IVPB 500 mg  Status:  Discontinued     500 mg 100 mL/hr over 60 Minutes Intravenous  Once 02/27/18 1600 02/27/18 1748      Assessment/Plan Ischemic cardiomyopathy - improving EF BI V PTVP Hx of PAF Pulmonary Hypertension - ongoing orthopnea, PND,DOE Hx of AF - off anticoaguation CKD Hypertension Hx of GI bleed/gastric ulcer BMI 28.8  Recurrent diverticulitis with small abscess  FEN: IV KVO/starting clear liquids this a.m. ID: [email or web address] 10/23 =>> day 2 DVT: SCDs added; DVT prophylaxis okay from our standpoint Follow-up: TBD  Plan: Continue IV antibiotics, clear liquids, continue her oral home medications.       LOS: 1 day     Yaw Escoto 02/28/2018 [phone]

## 2018-03-01 ENCOUNTER — Telehealth (HOSPITAL_COMMUNITY): Payer: Self-pay

## 2018-03-01 ENCOUNTER — Ambulatory Visit: Payer: Medicare Other

## 2018-03-01 ENCOUNTER — Other Ambulatory Visit: Payer: Medicare Other

## 2018-03-01 LAB — CBC WITH DIFFERENTIAL/PLATELET
Abs Immature Granulocytes: 0.13 10*3/uL — ABNORMAL HIGH (ref 0.00–0.07)
Basophils Absolute: 0.1 10*3/uL (ref 0.0–0.1)
Basophils Relative: 1 %
EOS ABS: 0.2 10*3/uL (ref 0.0–0.5)
EOS PCT: 2 %
HEMATOCRIT: 40.3 % (ref 36.0–46.0)
HEMOGLOBIN: 12.6 g/dL (ref 12.0–15.0)
Immature Granulocytes: 2 %
LYMPHS ABS: 0.9 10*3/uL (ref 0.7–4.0)
LYMPHS PCT: 11 %
MCH: 26.8 pg (ref 26.0–34.0)
MCHC: 31.3 g/dL (ref 30.0–36.0)
MCV: 85.7 fL (ref 80.0–100.0)
MONOS PCT: 9 %
Monocytes Absolute: 0.8 10*3/uL (ref 0.1–1.0)
Neutro Abs: 6.5 10*3/uL (ref 1.7–7.7)
Neutrophils Relative %: 75 %
Platelets: 263 10*3/uL (ref 150–400)
RBC: 4.7 MIL/uL (ref 3.87–5.11)
RDW: 18 % — AB (ref 11.5–15.5)
WBC: 8.5 10*3/uL (ref 4.0–10.5)
nRBC: 0 % (ref 0.0–0.2)

## 2018-03-01 LAB — BASIC METABOLIC PANEL
ANION GAP: 11 (ref 5–15)
BUN: 34 mg/dL — ABNORMAL HIGH (ref 8–23)
CALCIUM: 8.9 mg/dL (ref 8.9–10.3)
CO2: 21 mmol/L — AB (ref 22–32)
Chloride: 104 mmol/L (ref 98–111)
Creatinine, Ser: 2.15 mg/dL — ABNORMAL HIGH (ref 0.44–1.00)
GFR calc Af Amer: 26 mL/min — ABNORMAL LOW (ref >60)
GFR, EST NON AFRICAN AMERICAN: 22 mL/min — AB (ref >60)
GLUCOSE: 94 mg/dL (ref 70–99)
Potassium: 3.6 mmol/L (ref 3.5–5.1)
Sodium: 136 mmol/L (ref 135–145)

## 2018-03-01 LAB — GLUCOSE, CAPILLARY
GLUCOSE-CAPILLARY: 80 mg/dL (ref 70–99)
Glucose-Capillary: 84 mg/dL (ref 70–99)
Glucose-Capillary: 74 mg/dL (ref 70–99)
Glucose-Capillary: 70 mg/dL (ref 70–99)

## 2018-03-01 NOTE — Progress Notes (Addendum)
PROGRESS NOTE    Shenelle Klas  IWP:[id number] DOB: October 23, 1945 DOA: 02/27/2018 PCP: [name], DO   Brief Narrative: Raguel Kosloski is a 72 y.o. female with medical history significant of CHF, CKD stage III, type 2 diabetes, hypertension, hyperlipidemia, depression, diverticulitis, depression, OSA who presents to the emergency department from her PCPs office.  Patient reported of having abdominal pain mainly on the left side since a week.  Patient also reported of having chills at home and loose stools .She  underwent outpatient CT scan as per her PCP which showed sigmoid diverticulitis with 2.9 cm abscess.Surgery consulted.  Assessment & Plan:   Principal Problem:   Diverticulitis Active Problems:   Hypothyroidism   OBSTRUCTIVE SLEEP APNEA   Biventricular ICD (implantable cardioverter-defibrillator) in place   Nonischemic cardiomyopathy (HCC)   Type 2 diabetes, controlled, with renal manifestation ([address])   Chronic kidney disease - followed by [facility]   Atypical atrial flutter ([place])   Pulmonary hypertension ([place]) on echocardiogram   Diverticulitis with diverticular abscess: CT imaging as above. Continue pain management.  General surgery following.  Plan for conservative management.Started on clear liquid diet yesterday but she had nausea and vomiting last night.  Also has abdominal pain that has not got worsened.  She will be kept back to n.p.o.  Plan is to continue monitoring.  If she continues to have nausea, vomiting or abdominal pain gets worse we will get a CT scan to evaluate for enlargement of the abscess and possible drainage.CBC ordered .  Chronic diastolic CHF: Patient has history of nonischemic cardiomyopathy.  She previously had systolic CHF for which she has biventricular pacemaker with recovery of her EF.  Echocardiogram was done  at the cardiology office which showed ejection fraction of 55-60%, moderate pulmonary hypertension. Currently she is euvolemic  and is not in acute heart failure.  She is on torsemide at home which will be held for now .Will not start on  fluids also.   CKD stage III: Follows with nephrology [name].  Currently her kidney function is at baseline.  On torsemide at home which we will hold for now.  Continue monitor kidney function.  Paroxysmal atrial flutter: Follows with cardiology.  On amiodarone and carvedilol.She was on Eliquis in the past which was held due to history of recurrent GI bleed.  She had a history of gastric ulcer and also multiple small bowel AVMs on capsule endoscopy.  Hypertension: We will continue monitor his blood pressure.  Continue home medications.Continue PRN IV meds.  Gout: On allopurinol and colchicine.    Depression: Continue her home medication Zoloft.  Hypothyroidism: Continue Synthyroid at home dose.  Obstructive sleep apnea: Doesnot use CPAP at home.  Diabetes type 2: Diet controlled.  Continue sliding scale insulin here.  Hypokalemia: Supplemented and corrected.   DVT prophylaxis: Heparin [place] Code Status: Full Family Communication: None present at the bed side Disposition Plan: Home after improvement( tolerance of diet and resolution of pain)   Consultants: General surgery  Procedures: None  Antimicrobials: Zosyn  Subjective: Patient seen and examined the bedside this morning.  Had nausea and vomiting last night.  Persistent abdominal pain which had not worsened.  Continues to ambulate.  Has been having bowel movements.  Objective: Vitals:   02/27/18 1847 02/27/18 1900 02/27/18 1951 02/28/18 1325  BP: (!) 183/55 (!) 167/51 (!) 167/57 (!) 118/58  Pulse: 60 60 61 60  Resp: (!) 23 (!) 21 18 18   Temp:   97.9 F (36.6 C)  98.4 F (36.9 C)  TempSrc:   Oral Oral  SpO2: 93% 95% 99% 97%    Intake/Output Summary (Last 24 hours) at 03/01/2018 0938 Last data filed at 03/01/2018 0902 Gross per 24 hour  Intake 600 ml  Output -  Net 600 ml   There were no  vitals filed for this visit.  Examination:  General exam: Appears calm and comfortable ,Not in distress,average built HEENT:PERRL,Oral mucosa moist, Ear/Nose normal on gross exam Respiratory system: Bilateral equal air entry, normal vesicular breath sounds, no wheezes or crackles  Cardiovascular system: S1 & S2 heard, RRR. No JVD, murmurs, rubs, gallops or clicks. No pedal edema. Gastrointestinal system: Abdomen is nondistended, soft , mild generalized tenderness more on the left lower quadrant.  No organomegaly or masses felt. Normal bowel sounds heard. Central nervous system: Alert and oriented. No focal neurological deficits. Extremities: No edema, no clubbing ,no cyanosis, distal peripheral pulses palpable. Skin: No rashes, lesions or ulcers,no icterus ,no pallor MSK: Normal muscle bulk,tone ,power Psychiatry: Judgement and insight appear normal. Mood & affect appropriate.     Data Reviewed: I have personally reviewed following labs and imaging studies  CBC: Recent Labs  Lab 02/26/18 1513 02/27/18 1630 02/28/18 0549  WBC 7.6 8.0 7.1  NEUTROABS 5.4  --  5.3  HGB 13.9 14.2 12.7  HCT 42.3 45.8 42.0  MCV 85.7 86.6 88.2  PLT 230.0 296 182   Basic Metabolic Panel: Recent Labs  Lab 02/26/18 1513 02/27/18 1730 02/28/18 0549 03/01/18 0600  NA 137 136 140 136  K 3.4* 5.9* 3.5 3.6  CL 102 101 107 104  CO2 26 20* 21* 21*  GLUCOSE 109* 111* 113* 94  BUN 41* 38* 35* 34*  CREATININE 2.21* 2.26* 2.09* 2.15*  CALCIUM 9.9 8.6* 8.9 8.9   GFR: Estimated Creatinine Clearance: 19.9 mL/min (A) (by C-G formula based on SCr of 2.15 mg/dL (H)). Liver Function Tests: Recent Labs  Lab 02/27/18 1730  AST 56*  ALT 30  ALKPHOS 80  BILITOT 1.8*  PROT 8.2*  ALBUMIN 3.7   Recent Labs  Lab 02/27/18 1730  LIPASE 39   No results for input(s): AMMONIA in the last 168 hours. Coagulation Profile: No results for input(s): INR, PROTIME in the last 168 hours. Cardiac Enzymes: No  results for input(s): CKTOTAL, CKMB, CKMBINDEX, TROPONINI in the last 168 hours. BNP (last 3 results) No results for input(s): PROBNP in the last 8760 hours. HbA1C: No results for input(s): HGBA1C in the last 72 hours. CBG: Recent Labs  Lab 02/28/18 0711 02/28/18 1127 02/28/18 1642 02/28/18 2020 03/01/18 0730  GLUCAP 101* 140* 100* 95 84   Lipid Profile: No results for input(s): CHOL, HDL, LDLCALC, TRIG, CHOLHDL, LDLDIRECT in the last 72 hours. Thyroid Function Tests: No results for input(s): TSH, T4TOTAL, FREET4, T3FREE, THYROIDAB in the last 72 hours. Anemia Panel: No results for input(s): VITAMINB12, FOLATE, FERRITIN, TIBC, IRON, RETICCTPCT in the last 72 hours. Sepsis Labs: No results for input(s): PROCALCITON, LATICACIDVEN in the last 168 hours.  No results found for this or any previous visit (from the past 240 hour(s)).       Radiology Studies: Ct Abdomen Pelvis Wo Contrast  Result Date: 02/27/2018 CLINICAL DATA:  Bilateral lower quadrant pain. Chills. Diarrhea. Chronic hepatitis-C. EXAM: CT ABDOMEN AND PELVIS WITHOUT CONTRAST TECHNIQUE: Multidetector CT imaging of the abdomen and pelvis was performed following the standard protocol without IV contrast. COMPARISON:  08/03/2017 FINDINGS: Lower chest: No acute findings. Hepatobiliary: Hepatic cirrhosis again demonstrated.  No mass visualized on this unenhanced exam. High attenuation gallbladder sludge or tiny noncalcified gallstones noted. No evidence of cholecystitis or biliary ductal dilatation. Pancreas: No mass or inflammatory process visualized on this unenhanced exam. Spleen:  Within normal limits in size. Adrenals/Urinary tract: No evidence of urolithiasis or hydronephrosis. Unremarkable unopacified urinary bladder. Stomach/Bowel: Diverticulosis is seen involving the descending and proximal sigmoid colon. Moderate wall thickening and adjacent inflammatory changes are seen involving the sigmoid colon. There is also wall  thickening involving an adjacent small bowel loop in the central pelvis. In addition, there is a small fluid and gas containing collection in the central small bowel mesentery measuring 2.9 x 2.1 cm on image 51/3. No evidence of free intraperitoneal air. No evidence of bowel obstruction. Vascular/Lymphatic: No pathologically enlarged lymph nodes identified. No evidence of abdominal aortic aneurysm. Aortic atherosclerosis. Reproductive: Several calcified uterine fibroids are seen, largest measuring 6 cm. Adnexal regions are unremarkable in appearance. Other:  None. Musculoskeletal:  No suspicious bone lesions identified. IMPRESSION: Inflammatory process involving the mid sigmoid colon and adjacent small bowel loop, with small abscess in the adjacent small bowel mesentery measuring 2.9 cm. This is suspicious for sigmoid diverticulitis with small diverticular abscess, with primary enteritis considered less likely. Several calcified uterine fibroids, largest measuring 6 cm. Hepatic cirrhosis. Gallbladder sludge versus tiny gallstones. Electronically Signed   By: Earle Gell M.D.   On: 02/27/2018 10:44        Scheduled Meds: . allopurinol  100 mg Oral Daily  . amiodarone  200 mg Oral QHS  . carvedilol  25 mg Oral BID  . heparin  5,000 Units Subcutaneous Q8H  . insulin aspart  0-9 Units Subcutaneous TID WC  . isosorbide-hydrALAZINE  1 tablet Oral BID  . levothyroxine  125 mcg Oral QAC breakfast  . pantoprazole  40 mg Oral Daily  . sertraline  75 mg Oral Daily   Continuous Infusions: . sodium chloride 10 mL/hr at 02/28/18 0308  . piperacillin-tazobactam (ZOSYN)  IV 3.375 g (03/01/18 0620)     LOS: 2 days    Time spent:25 mins. More than 50% of that time was spent in counseling and/or coordination of care.      Shelly Coss, MD [facility] Pager [phone]  If 7PM-7AM, please contact night-coverage [email or web address] Password [facility] 03/01/2018, 9:24 AM

## 2018-03-01 NOTE — Progress Notes (Signed)
Subjective N/v overnight x1. Pain about the same, states not worse. Ambulating.  Objective: Vital signs in last 24 hours: Temp:  [98.4 F (36.9 C)-98.9 F (37.2 C)] 98.9 F (37.2 C) (10/25 0609) Pulse Rate:  [59-60] 59 (10/25 0835) Resp:  [17-20] 20 (10/25 0835) BP: (118-153)/(58-63) 141/61 (10/25 0835) SpO2:  [95 %-97 %] 96 % (10/25 0835) Last BM Date: 02/28/18  Intake/Output from previous day: 10/24 0701 - 10/25 0700 In: 600 [P.O.:600] Out: -  Intake/Output this shift: No intake/output data recorded.  Gen: NAD, comfortable CV: RRR Pulm: Normal work of breathing Abd: Soft, ttp along left abdomen and hypogastrium. No tenderness along right abdomen. Not significantly distended. No rebound. No guarding Ext: SCDs in place  Lab Results: CBC  Recent Labs    02/27/18 1630 02/28/18 0549  WBC 8.0 7.1  HGB 14.2 12.7  HCT 45.8 42.0  PLT 296 276   BMET Recent Labs    02/28/18 0549 03/01/18 0600  NA 140 136  K 3.5 3.6  CL 107 104  CO2 21* 21*  GLUCOSE 113* 94  BUN 35* 34*  CREATININE 2.09* 2.15*  CALCIUM 8.9 8.9   PT/INR No results for input(s): LABPROT, INR in the last 72 hours. ABG No results for input(s): PHART, HCO3 in the last 72 hours.  Invalid input(s): PCO2, PO2  Studies/Results:  Anti-infectives: Anti-infectives (From admission, onward)   Start     Dose/Rate Route Frequency Ordered Stop   02/28/18 1200  piperacillin-tazobactam (ZOSYN) IVPB 3.375 g     3.375 g 12.5 mL/hr over 240 Minutes Intravenous Every 8 hours 02/28/18 1043     02/27/18 2315  metroNIDAZOLE (FLAGYL) IVPB 500 mg  Status:  Discontinued     500 mg 100 mL/hr over 60 Minutes Intravenous Every 8 hours 02/27/18 1709 02/27/18 1748   02/27/18 1845  piperacillin-tazobactam (ZOSYN) IVPB 2.25 g  Status:  Discontinued     2.25 g 100 mL/hr over 30 Minutes Intravenous Every 8 hours 02/27/18 1805 02/28/18 1043   02/27/18 1715  cefTRIAXone (ROCEPHIN) 1 g in sodium chloride 0.9 % 100 mL IVPB   Status:  Discontinued     1 g 200 mL/hr over 30 Minutes Intravenous Every 24 hours 02/27/18 1709 02/27/18 1748   02/27/18 1615  cefTRIAXone (ROCEPHIN) 2 g in sodium chloride 0.9 % 100 mL IVPB     2 g 200 mL/hr over 30 Minutes Intravenous  Once 02/27/18 1600 02/27/18 1653   02/27/18 1615  metroNIDAZOLE (FLAGYL) IVPB 500 mg  Status:  Discontinued     500 mg 100 mL/hr over 60 Minutes Intravenous  Once 02/27/18 1600 02/27/18 1748       Assessment/Plan: Patient Active Problem List   Diagnosis Date Noted  . RLQ abdominal pain 02/26/2018  . LLQ abdominal pain 02/26/2018  . Diarrhea 02/26/2018  . Chills 02/26/2018  . Anemia in chronic kidney disease 01/18/2018  . Pulmonary hypertension ([place]) on echocardiogram 01/14/2018  . Angiodysplasia of intestinal tract 01/14/2018  . GI bleeding 01/03/2018  . Heme positive stool 12/12/2017  . Polyp of transverse colon   . Diverticulosis of colon without hemorrhage   . Melena 11/06/2017  . Anemia 10/25/2017  . Atrial fibrillation, chronic 10/25/2017  . Aortic atherosclerosis ([place]) 08/30/2017  . Pulmonary edema, acute ([place]) 08/09/2017  . Pain and swelling of left upper extremity 08/08/2017  . ARF (acute renal failure) ([place])   . Enteritis   . Partial small bowel obstruction ([place])   . Diverticulitis 07/31/2017  .  Hypertension associated with diabetes ([place]) 06/07/2017  . MDD (major depressive disorder), recurrent, in partial remission ([place]) 06/07/2017  . Asthma 05/22/2017  . Chronic obstructive pulmonary disease ([place])   . Atypical atrial flutter ([place]) 09/28/2015  . Type 2 diabetes, controlled, with renal manifestation ([place]) 11/24/2013  . Chronic kidney disease - followed by [facility] 11/24/2013  . Nonischemic cardiomyopathy ([place]) 11/22/2010  . Obesity 05/27/2009  . OSTEOPENIA 09/30/2008  . OBSTRUCTIVE SLEEP APNEA 11/14/2007  . Chronic diastolic CHF (congestive heart failure) ([place]) 04/24/2007  . Hypothyroidism 01/28/2007  . Biventricular  ICD (implantable cardioverter-defibrillator) in place 01/08/2007   Ischemic cardiomyopathy - improving EF BI V PTVP Hx of PAF Pulmonary Hypertension - ongoing orthopnea, PND,DOE Hx of AF - off anticoaguation CKD Hypertension Hx of GI bleed/gastric ulcer BMI 28.8  Recurrent diverticulitis with small abscess  FEN: Start maintenance IVF; NPO given n/v ID: Zosyn 10/23 =>> day 3 DVT: SCDs added; DVT prophylaxis okay from our standpoint Follow-up: TBD  Plan: Continue IV antibiotics, NPO, continue her oral home medications. Monitor - if wbc uptrends or not improving in next few days, will consider repeat CT to assess for collection amenable to drainage    LOS: 2 days   Sharon Mt. Dema Severin, M.D. [facility]

## 2018-03-01 NOTE — Plan of Care (Signed)
  Problem: Pain Managment: Goal: General experience of comfort will improve Outcome: Progressing   Problem: Safety: Goal: Ability to remain free from injury will improve Outcome: Progressing   Problem: Skin Integrity: Goal: Risk for impaired skin integrity will decrease Outcome: Progressing   

## 2018-03-01 NOTE — Telephone Encounter (Signed)
Pt called no answer voice mail for pt to call back EF 55-60% with moderate pulmonary hypertension and moderate aortic insufficiency.

## 2018-03-02 DIAGNOSIS — K5720 Diverticulitis of large intestine with perforation and abscess without bleeding: Principal | ICD-10-CM

## 2018-03-02 LAB — CBC
HEMATOCRIT: 43 % (ref 36.0–46.0)
HEMOGLOBIN: 13 g/dL (ref 12.0–15.0)
MCH: 26.6 pg (ref 26.0–34.0)
MCHC: 30.2 g/dL (ref 30.0–36.0)
MCV: 87.9 fL (ref 80.0–100.0)
NRBC: 0 % (ref 0.0–0.2)
Platelets: 285 10*3/uL (ref 150–400)
RBC: 4.89 MIL/uL (ref 3.87–5.11)
RDW: 18.4 % — ABNORMAL HIGH (ref 11.5–15.5)
WBC: 8.4 10*3/uL (ref 4.0–10.5)

## 2018-03-02 LAB — GLUCOSE, CAPILLARY
GLUCOSE-CAPILLARY: 72 mg/dL (ref 70–99)
GLUCOSE-CAPILLARY: 81 mg/dL (ref 70–99)
GLUCOSE-CAPILLARY: 112 mg/dL — AB (ref 70–99)
Glucose-Capillary: 62 mg/dL — ABNORMAL LOW (ref 70–99)
Glucose-Capillary: 161 mg/dL — ABNORMAL HIGH (ref 70–99)
Glucose-Capillary: 58 mg/dL — ABNORMAL LOW (ref 70–99)

## 2018-03-02 LAB — BASIC METABOLIC PANEL
ANION GAP: 16 — AB (ref 5–15)
BUN: 31 mg/dL — ABNORMAL HIGH (ref 8–23)
CHLORIDE: 107 mmol/L (ref 98–111)
CO2: 18 mmol/L — ABNORMAL LOW (ref 22–32)
Calcium: 9.3 mg/dL (ref 8.9–10.3)
Creatinine, Ser: 2.5 mg/dL — ABNORMAL HIGH (ref 0.44–1.00)
GFR, EST AFRICAN AMERICAN: 21 mL/min — AB (ref >60)
GFR, EST NON AFRICAN AMERICAN: 18 mL/min — AB (ref >60)
Glucose, Bld: 71 mg/dL (ref 70–99)
POTASSIUM: 3.6 mmol/L (ref 3.5–5.1)
SODIUM: 141 mmol/L (ref 135–145)

## 2018-03-02 MED ORDER — PROCHLORPERAZINE EDISYLATE 10 MG/2ML IJ SOLN
5.0000 mg | INTRAMUSCULAR | Status: DC | PRN
  Administered 2018-03-04: 10 mg via INTRAVENOUS
  Filled 2018-03-02: qty 2

## 2018-03-02 MED ORDER — LIP MEDEX EX OINT
1.0000 "application " | TOPICAL_OINTMENT | Freq: Two times a day (BID) | CUTANEOUS | Status: DC
  Administered 2018-03-02 – 2018-03-06 (×6): 1 via TOPICAL
  Filled 2018-03-02: qty 7

## 2018-03-02 MED ORDER — PHENOL 1.4 % MT LIQD
1.0000 | OROMUCOSAL | Status: DC | PRN
  Administered 2018-03-05: 2 via OROMUCOSAL
  Filled 2018-03-02 (×2): qty 177

## 2018-03-02 MED ORDER — HYDROCORTISONE 1 % EX CREA
1.0000 "application " | TOPICAL_CREAM | Freq: Three times a day (TID) | CUTANEOUS | Status: DC | PRN
  Filled 2018-03-02: qty 28

## 2018-03-02 MED ORDER — GUAIFENESIN-DM 100-10 MG/5ML PO SYRP: 10.0000 mL | ORAL_SOLUTION | ORAL | Status: DC | PRN

## 2018-03-02 MED ORDER — MENTHOL 3 MG MT LOZG
1.0000 | LOZENGE | OROMUCOSAL | Status: DC | PRN
  Filled 2018-03-02 (×2): qty 9

## 2018-03-02 MED ORDER — ALUM & MAG HYDROXIDE-SIMETH 200-200-20 MG/5ML PO SUSP: 30.0000 mL | Freq: Four times a day (QID) | ORAL | Status: DC | PRN

## 2018-03-02 MED ORDER — DEXTROSE 50 % IV SOLN
INTRAVENOUS | Status: AC
  Filled 2018-03-02: qty 50

## 2018-03-02 MED ORDER — METHOCARBAMOL 1000 MG/10ML IJ SOLN
1000.0000 mg | Freq: Four times a day (QID) | INTRAVENOUS | Status: DC | PRN
  Administered 2018-03-03: 1000 mg via INTRAVENOUS
  Filled 2018-03-02 (×2): qty 10

## 2018-03-02 MED ORDER — DEXTROSE 50 % IV SOLN: 25.0000 mL | Freq: Once | INTRAVENOUS | Status: AC

## 2018-03-02 MED ORDER — MAGIC MOUTHWASH
15.0000 mL | Freq: Four times a day (QID) | ORAL | Status: DC | PRN
  Filled 2018-03-02: qty 15

## 2018-03-02 MED ORDER — PIPERACILLIN-TAZOBACTAM IN DEX 2-0.25 GM/50ML IV SOLN
2.2500 g | Freq: Four times a day (QID) | INTRAVENOUS | Status: DC
  Administered 2018-03-02 – 2018-03-04 (×8): 2.25 g via INTRAVENOUS
  Filled 2018-03-02 (×10): qty 50

## 2018-03-02 MED ORDER — SACCHAROMYCES BOULARDII 250 MG PO CAPS
250.0000 mg | ORAL_CAPSULE | Freq: Two times a day (BID) | ORAL | Status: DC
  Administered 2018-03-02 – 2018-03-06 (×7): 250 mg via ORAL
  Filled 2018-03-02 (×7): qty 1

## 2018-03-02 MED ORDER — HYDROCORTISONE 2.5 % RE CREA
1.0000 "application " | TOPICAL_CREAM | Freq: Four times a day (QID) | RECTAL | Status: DC | PRN
  Filled 2018-03-02: qty 28.35

## 2018-03-02 MED ORDER — LACTATED RINGERS IV BOLUS: 1000.0000 mL | Freq: Three times a day (TID) | INTRAVENOUS | Status: DC | PRN

## 2018-03-02 NOTE — Progress Notes (Signed)
Morgan Clay [id number] [birth year]  CARE TEAM:  PCP: [name], DO  Outpatient Care Team: Patient Care Team: [name], DO as PCP - General (Family Medicine) [name], MD as PCP - Cardiology (Cardiology) [name], MD as Consulting Physician (Nephrology) Ladene Artist, MD as Consulting Physician (Gastroenterology)  Inpatient Treatment Team: Treatment Team: Attending Provider: Shelly Coss, MD; Consulting Physician: Edison Pace, Md, MD; Registered Nurse: Lolita Rieger, RN; Rounding Team: Fatima Blank, MD; Registered Nurse: Bobby Rumpf, RN; Registered Nurse: Lenice Pressman, RN   Problem List:   Principal Problem:   Diverticulitis of sigmoid colon with abscess Active Problems:   Nonischemic cardiomyopathy ([place])   CKD (chronic kidney disease) stage 4, GFR 15-29 ml/min    Hypothyroidism   OBSTRUCTIVE SLEEP APNEA   Biventricular ICD (implantable cardioverter-defibrillator) in place   Type 2 diabetes, controlled, with renal manifestation ([place])   Atypical atrial flutter ([place])   Chronic obstructive pulmonary disease ([place])   Hypertension associated with diabetes ([place])   Pulmonary hypertension ([place]) on echocardiogram      * No surgery found [facility] Stay = 3 days  Assessment   Recurrent sigmoid diverticulitis with abscess near small bowel.    Ileus somewhat resolving  Plan:  -Continue IV Zosyn antibiotics.  Clear liquids.  Advance to dysphagia 1 as tolerated given bowels opening up.  Low threshold to go back to sips only.  -If she is not consistently improving, repeat CAT scan on Monday, 5 days from last CAT scan.  See if the abscess has resolved or requires drainage.  Ideally would like to avoid operative intervention this admission since likelihood of Hartmann resection with ostomy rather high.  She wishes to avoid that as well.  Most likely she would benefit from elective sigmoid colectomy in the future to break the cycle  of attacks since now she has had a complicated attack and this is at least her third.  That can be discussed in the future.  The patient is stable.  There is no evidence of peritonitis, acute abdomen, nor shock.  There is no strong evidence of failure of improvement nor decline with current non-operative management.  There is no need for surgery at the present moment.  We will continue to follow.    -VTE prophylaxis- SCDs, etc -mobilize as tolerated to help recovery  25 minutes spent in review, evaluation, examination, counseling, and coordination of care.  More than 50% of that time was spent in counseling.  03/02/2018    Subjective: (Chief complaint)  N/V yesterday AM again - not since Loose BMs PAin still there but a little less  Objective:  Vital signs:  Vitals:   02/27/18 1951 02/28/18 1325 03/01/18 1400 03/01/18 1403  BP: (!) 167/57 (!) 118/58  (!) 147/61  Pulse: 61 60  (!) 59  Resp: '18 18  14  ' Temp: 97.9 F (36.6 C) 98.4 F (36.9 C) 97.7 F (36.5 C)   TempSrc: Oral Oral Oral   SpO2: 99% 97%  99%    Last BM Date: 03/02/18  Intake/Output   Yesterday:  10/25 0701 - 10/26 0700 In: 136.5 [I.V.:43.7; IV Piggyback:92.8] Out: -  This shift:  No intake/output data recorded.  Bowel function:  Flatus: YES  BM:  YES  Drain: (No drain)   Physical Exam:  General: Pt awake/alert/oriented x4 in no acute distress.  Calm.  Not toxic/sickly Eyes: PERRL, normal EOM.  Sclera clear.  No icterus Neuro: CN  II-XII intact w/o focal sensory/motor deficits. Lymph: No head/neck/groin lymphadenopathy Psych:  No delerium/psychosis/paranoia HENT: Normocephalic, Mucus membranes moist.  No thrush Neck: Supple, No tracheal deviation Chest: No chest wall pain w good excursion CV:  Pulses intact.  Regular rhythm MS: Normal AROM mjr joints.  No obvious deformity  Abdomen: Soft.  Nondistended.  Tenderness at LLQ / suprapubic - mild.  No evidence of peritonitis.  No incarcerated  hernias.  Ext:  No deformity.  No mjr edema.  No cyanosis Skin: No petechiae / purpura  Results:   Labs: Results for orders placed or performed during the hospital encounter of 02/27/18 (from the past 48 hour(s))  Glucose, capillary     Status: Abnormal   Collection Time: 02/28/18 11:27 AM  Result Value Ref Range   Glucose-Capillary 140 (H) 70 - 99 mg/dL  Glucose, capillary     Status: Abnormal   Collection Time: 02/28/18  4:42 PM  Result Value Ref Range   Glucose-Capillary 100 (H) 70 - 99 mg/dL  Glucose, capillary     Status: None   Collection Time: 02/28/18  8:20 PM  Result Value Ref Range   Glucose-Capillary 95 70 - 99 mg/dL  Basic metabolic panel     Status: Abnormal   Collection Time: 03/01/18  6:00 AM  Result Value Ref Range   Sodium 136 135 - 145 mmol/L   Potassium 3.6 3.5 - 5.1 mmol/L   Chloride 104 98 - 111 mmol/L   CO2 21 (L) 22 - 32 mmol/L   Glucose, Bld 94 70 - 99 mg/dL   BUN 34 (H) 8 - 23 mg/dL   Creatinine, Ser 2.15 (H) 0.44 - 1.00 mg/dL   Calcium 8.9 8.9 - 10.3 mg/dL   GFR calc non Af Amer 22 (L) >60 mL/min   GFR calc Af Amer 26 (L) >60 mL/min    Comment: (NOTE) The eGFR has been calculated using the CKD EPI equation. This calculation has not been validated in all clinical situations. eGFR's persistently <60 mL/min signify possible Chronic Kidney Disease.    Anion gap 11 5 - 15    Comment: Performed at [facility], [place] [address] [place], [place] [address]  Glucose, capillary     Status: None   Collection Time: 03/01/18  7:30 AM  Result Value Ref Range   Glucose-Capillary 84 70 - 99 mg/dL  CBC with Differential/Platelet     Status: Abnormal   Collection Time: 03/01/18  9:44 AM  Result Value Ref Range   WBC 8.5 4.0 - 10.5 K/uL   RBC 4.70 3.87 - 5.11 MIL/uL   Hemoglobin 12.6 12.0 - 15.0 g/dL   HCT 40.3 36.0 - 46.0 %   MCV 85.7 80.0 - 100.0 fL   MCH 26.8 26.0 - 34.0 pg   MCHC 31.3 30.0 - 36.0 g/dL   RDW 18.0 (H) 11.5 - 15.5 %    Platelets 263 150 - 400 K/uL   nRBC 0.0 0.0 - 0.2 %   Neutrophils Relative % 75 %   Neutro Abs 6.5 1.7 - 7.7 K/uL   Lymphocytes Relative 11 %   Lymphs Abs 0.9 0.7 - 4.0 K/uL   Monocytes Relative 9 %   Monocytes Absolute 0.8 0.1 - 1.0 K/uL   Eosinophils Relative 2 %   Eosinophils Absolute 0.2 0.0 - 0.5 K/uL   Basophils Relative 1 %   Basophils Absolute 0.1 0.0 - 0.1 K/uL   Immature Granulocytes 2 %   Abs Immature Granulocytes 0.13 (  H) 0.00 - 0.07 K/uL    Comment: Performed at [facility], [place] [address] [place], [place] [address]  Glucose, capillary     Status: None   Collection Time: 03/01/18 12:09 PM  Result Value Ref Range   Glucose-Capillary 74 70 - 99 mg/dL  Glucose, capillary     Status: None   Collection Time: 03/01/18  4:49 PM  Result Value Ref Range   Glucose-Capillary 80 70 - 99 mg/dL  Glucose, capillary     Status: None   Collection Time: 03/01/18  7:58 PM  Result Value Ref Range   Glucose-Capillary 70 70 - 99 mg/dL  Basic metabolic panel     Status: Abnormal   Collection Time: 03/02/18  5:27 AM  Result Value Ref Range   Sodium 141 135 - 145 mmol/L   Potassium 3.6 3.5 - 5.1 mmol/L   Chloride 107 98 - 111 mmol/L   CO2 18 (L) 22 - 32 mmol/L   Glucose, Bld 71 70 - 99 mg/dL   BUN 31 (H) 8 - 23 mg/dL   Creatinine, Ser 2.50 (H) 0.44 - 1.00 mg/dL   Calcium 9.3 8.9 - 10.3 mg/dL   GFR calc non Af Amer 18 (L) >60 mL/min   GFR calc Af Amer 21 (L) >60 mL/min    Comment: (NOTE) The eGFR has been calculated using the CKD EPI equation. This calculation has not been validated in all clinical situations. eGFR's persistently <60 mL/min signify possible Chronic Kidney Disease.    Anion gap 16 (H) 5 - 15    Comment: Performed at [facility], [place] [address], [place], [place] [address]  CBC     Status: Abnormal   Collection Time: 03/02/18  5:27 AM  Result Value Ref Range   WBC 8.4 4.0 - 10.5 K/uL   RBC 4.89 3.87 - 5.11 MIL/uL    Hemoglobin 13.0 12.0 - 15.0 g/dL   HCT 43.0 36.0 - 46.0 %   MCV 87.9 80.0 - 100.0 fL   MCH 26.6 26.0 - 34.0 pg   MCHC 30.2 30.0 - 36.0 g/dL   RDW 18.4 (H) 11.5 - 15.5 %   Platelets 285 150 - 400 K/uL   nRBC 0.0 0.0 - 0.2 %    Comment: Performed at [facility], [place] [address], [place],  [address]  Glucose, capillary     Status: None   Collection Time: 03/02/18  7:38 AM  Result Value Ref Range   Glucose-Capillary 72 70 - 99 mg/dL   Comment 1 Notify RN    Comment 2 Document in Chart     Imaging / Studies: No results found.  Medications / Allergies: per chart  Antibiotics: Anti-infectives (From admission, onward)   Start     Dose/Rate Route Frequency Ordered Stop   03/02/18 1200  piperacillin-tazobactam (ZOSYN) IVPB 2.25 g     2.25 g 100 mL/hr over 30 Minutes Intravenous Every 6 hours 03/02/18 0834     02/28/18 1200  piperacillin-tazobactam (ZOSYN) IVPB 3.375 g  Status:  Discontinued     3.375 g 12.5 mL/hr over 240 Minutes Intravenous Every 8 hours 02/28/18 1043 03/02/18 0834   02/27/18 2315  metroNIDAZOLE (FLAGYL) IVPB 500 mg  Status:  Discontinued     500 mg 100 mL/hr over 60 Minutes Intravenous Every 8 hours 02/27/18 1709 02/27/18 1748   02/27/18 1845  piperacillin-tazobactam (ZOSYN) IVPB 2.25 g  Status:  Discontinued     2.25 g 100 mL/hr  over 30 Minutes Intravenous Every 8 hours 02/27/18 1805 02/28/18 1043   02/27/18 1715  cefTRIAXone (ROCEPHIN) 1 g in sodium chloride 0.9 % 100 mL IVPB  Status:  Discontinued     1 g 200 mL/hr over 30 Minutes Intravenous Every 24 hours 02/27/18 1709 02/27/18 1748   02/27/18 1615  cefTRIAXone (ROCEPHIN) 2 g in sodium chloride 0.9 % 100 mL IVPB     2 g 200 mL/hr over 30 Minutes Intravenous  Once 02/27/18 1600 02/27/18 1653   02/27/18 1615  metroNIDAZOLE (FLAGYL) IVPB 500 mg  Status:  Discontinued     500 mg 100 mL/hr over 60 Minutes Intravenous  Once 02/27/18 1600 02/27/18 1748        Note: Portions of this  report may have been transcribed using voice recognition software. Every effort was made to ensure accuracy; however, inadvertent computerized transcription errors may be present.   Any transcriptional errors that result from this process are unintentional.     Adin Hector, MD, FACS, MASCRS Gastrointestinal and Minimally Invasive Surgery    [address] [place], [place] [address] [phone] Main / Paging [phone] Fax

## 2018-03-02 NOTE — Progress Notes (Signed)
PROGRESS NOTE    Morgan Clay  QTM:[id number] DOB: March 29, 1946 DOA: 02/27/2018 PCP: [name], DO   Brief Narrative: Morgan Clay is a 72 y.o. female with medical history significant of CHF, CKD stage III, type 2 diabetes, hypertension, hyperlipidemia, depression, diverticulitis, depression, OSA who presents to the emergency department from her PCPs office.  Patient reported of having abdominal pain mainly on the left side since a week.  Patient also reported of having chills at home and loose stools .She  underwent outpatient CT scan as per her PCP which showed sigmoid diverticulitis with 2.9 cm abscess.Surgery consulted and has been following.  Assessment & Plan:   Principal Problem:   Diverticulitis of sigmoid colon with abscess Active Problems:   Hypothyroidism   OBSTRUCTIVE SLEEP APNEA   Biventricular ICD (implantable cardioverter-defibrillator) in place   Nonischemic cardiomyopathy (HCC)   Type 2 diabetes, controlled, with renal manifestation (HCC)   CKD (chronic kidney disease) stage 4, GFR 15-29 ml/min    Atypical atrial flutter (H[facility])   Chronic obstructive pulmonary disease ([place])   Hypertension associated with diabetes ([place])   Pulmonary hypertension ([place]) on echocardiogram   Diverticulitis with diverticular abscess: CT imaging as above. Continue pain management.  General surgery following.  Plan for conservative management.Started on clear liquid diet  but she has intermittent  nausea and vomiting last night.  Also has abdominal pain that has not got worsened.   Plan is to continue monitoring.  If she continues to have nausea, vomiting or abdominal pain gets worse we will get a CT scan to evaluate for enlargement of the abscess and possible drainage.Continue clear liquid diet for today .  Chronic diastolic CHF: Patient has history of nonischemic cardiomyopathy.  She previously had systolic CHF for which she has biventricular pacemaker with recovery of her EF.   Echocardiogram was done  at the cardiology office which showed ejection fraction of 55-60%, moderate pulmonary hypertension. Currently she is euvolemic and is not in acute heart failure.  She is on torsemide at home which will be held for now because she is not taking enough fluids.  CKD stage III: Follows with nephrology [name].  Currently her kidney function is around baseline,slighlty up today.  On torsemide at home which we will hold for now but we can consider  restarting it soon.  Continue monitor kidney function.  Paroxysmal atrial flutter: Follows with cardiology.  On amiodarone and carvedilol.She was on Eliquis in the past which was held due to history of recurrent GI bleed.  She had a history of gastric ulcer and also multiple small bowel AVMs on capsule endoscopy.  Hypertension: We will continue monitor his blood pressure.  Continue home medications.Continue PRN IV meds.  Gout: On allopurinol and colchicine.    Depression: Continue her home medication Zoloft.  Hypothyroidism: Continue Synthyroid at home dose.  Obstructive sleep apnea: Doesnot use CPAP at home.  Diabetes type 2: Diet controlled.  Continue sliding scale insulin here.  Hypokalemia: Supplemented and corrected.   DVT prophylaxis: Heparin [place] Code Status: Full Family Communication: None present at the bed side Disposition Plan: Home after improvement( tolerance of diet and resolution of pain)   Consultants: General surgery  Procedures: None  Antimicrobials: Zosyn  Subjective: Patient seen and examined the bedside this morning.  Had nausea and vomiting last night.  Has on/off  abdominal pain which had not worsened.    Has been having loose bowel movements.  Objective: Vitals:   03/01/18 1400 03/01/18 1403 03/02/18 1108  03/02/18 1313  BP:  (!) 147/61 125/60 (!) 113/59  Pulse:  (!) 59 60 (!) 59  Resp:  14  18  Temp: 97.7 F (36.5 C)   98.7 F (37.1 C)  TempSrc: Oral   Oral  SpO2:  99% 99%  99%    Intake/Output Summary (Last 24 hours) at 03/02/2018 1444 Last data filed at 03/01/2018 1800 Gross per 24 hour  Intake 136.49 ml  Output -  Net 136.49 ml   There were no vitals filed for this visit.  Examination:  General exam: Appears calm and comfortable ,Not in distress,average built HEENT:PERRL,Oral mucosa moist, Ear/Nose normal on gross exam Respiratory system: Bilateral equal air entry, normal vesicular breath sounds, no wheezes or crackles  Cardiovascular system: S1 & S2 heard, RRR. No JVD, murmurs, rubs, gallops or clicks. Gastrointestinal system:Abdomen is nondistended, soft , mild generalized tenderness more on the left lower quadrant.  No organomegaly or masses felt. Normal bowel sounds heard. Central nervous system: Alert and oriented. No focal neurological deficits. Extremities: No edema, no clubbing ,no cyanosis, distal peripheral pulses palpable. Skin: No rashes, lesions or ulcers,no icterus ,no pallor MSK: Normal muscle bulk,tone ,power Psychiatry: Judgement and insight appear normal. Mood & affect appropriate.       Data Reviewed: I have personally reviewed following labs and imaging studies  CBC: Recent Labs  Lab 02/26/18 1513 02/27/18 1630 02/28/18 0549 03/01/18 0944 03/02/18 0527  WBC 7.6 8.0 7.1 8.5 8.4  NEUTROABS 5.4  --  5.3 6.5  --   HGB 13.9 14.2 12.7 12.6 13.0  HCT 42.3 45.8 42.0 40.3 43.0  MCV 85.7 86.6 88.2 85.7 87.9  PLT 230.0 296 276 263 081   Basic Metabolic Panel: Recent Labs  Lab 02/26/18 1513 02/27/18 1730 02/28/18 0549 03/01/18 0600 03/02/18 0527  NA 137 136 140 136 141  K 3.4* 5.9* 3.5 3.6 3.6  CL 102 101 107 104 107  CO2 26 20* 21* 21* 18*  GLUCOSE 109* 111* 113* 94 71  BUN 41* 38* 35* 34* 31*  CREATININE 2.21* 2.26* 2.09* 2.15* 2.50*  CALCIUM 9.9 8.6* 8.9 8.9 9.3   GFR: Estimated Creatinine Clearance: 17.1 mL/min (A) (by C-G formula based on SCr of 2.5 mg/dL (H)). Liver Function Tests: Recent Labs  Lab  02/27/18 1730  AST 56*  ALT 30  ALKPHOS 80  BILITOT 1.8*  PROT 8.2*  ALBUMIN 3.7   Recent Labs  Lab 02/27/18 1730  LIPASE 39   No results for input(s): AMMONIA in the last 168 hours. Coagulation Profile: No results for input(s): INR, PROTIME in the last 168 hours. Cardiac Enzymes: No results for input(s): CKTOTAL, CKMB, CKMBINDEX, TROPONINI in the last 168 hours. BNP (last 3 results) No results for input(s): PROBNP in the last 8760 hours. HbA1C: No results for input(s): HGBA1C in the last 72 hours. CBG: Recent Labs  Lab 03/01/18 1958 03/02/18 0738 03/02/18 1211 03/02/18 1246 03/02/18 1309  GLUCAP 70 72 62* 58* 161*   Lipid Profile: No results for input(s): CHOL, HDL, LDLCALC, TRIG, CHOLHDL, LDLDIRECT in the last 72 hours. Thyroid Function Tests: No results for input(s): TSH, T4TOTAL, FREET4, T3FREE, THYROIDAB in the last 72 hours. Anemia Panel: No results for input(s): VITAMINB12, FOLATE, FERRITIN, TIBC, IRON, RETICCTPCT in the last 72 hours. Sepsis Labs: No results for input(s): PROCALCITON, LATICACIDVEN in the last 168 hours.  No results found for this or any previous visit (from the past 240 hour(s)).       Radiology Studies: No  results found.      Scheduled Meds: . allopurinol  100 mg Oral Daily  . amiodarone  200 mg Oral QHS  . carvedilol  25 mg Oral BID  . dextrose      . heparin  5,000 Units Subcutaneous Q8H  . insulin aspart  0-9 Units Subcutaneous TID WC  . isosorbide-hydrALAZINE  1 tablet Oral BID  . levothyroxine  125 mcg Oral QAC breakfast  . lip balm  1 application Topical BID  . pantoprazole  40 mg Oral Daily  . saccharomyces boulardii  250 mg Oral BID  . sertraline  75 mg Oral Daily   Continuous Infusions: . sodium chloride 1,000 mL (03/02/18 0836)  . lactated ringers    . methocarbamol (ROBAXIN) IV    . piperacillin-tazobactam (ZOSYN)  IV 2.25 g (03/02/18 1330)     LOS: 3 days    Time spent:25 mins. More than 50% of that  time was spent in counseling and/or coordination of care.      Shelly Coss, MD [facility] Pager [phone]  If 7PM-7AM, please contact night-coverage [email or web address] Password [facility] 03/02/2018, 2:44 PM

## 2018-03-02 NOTE — Progress Notes (Signed)
Pharmacy Antibiotic Note  Lilianne Delair is a 72 y.o. female with a h/o CKD III admitted on 02/27/2018 with recurrent diverticulitis with abscess.  Pharmacy has been consulted for Zosyn dosing.  Initial doses of ceftriaxone and metronidazole in ED. Patient appears to be high-risk.  Day 4 Zosyn Hx CKD, Scr increase today, SCr 2.5 CrCl 17  Plan: Change Zosyn to 2.25g IV q6 due to CrCl < 20 ml/min F/U renal function     Temp (24hrs), Avg:98.1 F (36.7 C), Min:97.7 F (36.5 C), Max:98.7 F (37.1 C)  Recent Labs  Lab 02/26/18 1513 02/27/18 1630 02/27/18 1730 02/28/18 0549 03/01/18 0600 03/01/18 0944 03/02/18 0527  WBC 7.6 8.0  --  7.1  --  8.5 8.4  CREATININE 2.21*  --  2.26* 2.09* 2.15*  --  2.50*    Estimated Creatinine Clearance: 17.1 mL/min (A) (by C-G formula based on SCr of 2.5 mg/dL (H)).    No Known Allergies  Antimicrobials this admission: Ceftriaxone and metronidazole 10/23  Zosyn 10/23 >>   Dose adjustments this admission: 10/24: Increase Zosyn 3.375gm IV Q8h to be infused over 4hrs  Microbiology results: none  Thank you for allowing pharmacy to be a part of this patient's care.  Kara Mead 03/02/2018 8:32 AM

## 2018-03-02 NOTE — Progress Notes (Signed)
Patients CBG was 62 gave ginger ale and jello because patient has not eaten today.  Recheck of CBG was 58 so administered  dextrose 25 ml CBG up to 161.

## 2018-03-03 DIAGNOSIS — J449 Chronic obstructive pulmonary disease, unspecified: Secondary | ICD-10-CM

## 2018-03-03 DIAGNOSIS — I10 Essential (primary) hypertension: Secondary | ICD-10-CM

## 2018-03-03 DIAGNOSIS — E1159 Type 2 diabetes mellitus with other circulatory complications: Secondary | ICD-10-CM

## 2018-03-03 DIAGNOSIS — Z95810 Presence of automatic (implantable) cardiac defibrillator: Secondary | ICD-10-CM

## 2018-03-03 DIAGNOSIS — I484 Atypical atrial flutter: Secondary | ICD-10-CM

## 2018-03-03 LAB — GLUCOSE, CAPILLARY
GLUCOSE-CAPILLARY: 120 mg/dL — AB (ref 70–99)
GLUCOSE-CAPILLARY: 123 mg/dL — AB (ref 70–99)
Glucose-Capillary: 97 mg/dL (ref 70–99)
Glucose-Capillary: 88 mg/dL (ref 70–99)
Glucose-Capillary: 115 mg/dL — ABNORMAL HIGH (ref 70–99)

## 2018-03-03 LAB — BASIC METABOLIC PANEL
Anion gap: 14 (ref 5–15)
BUN: 33 mg/dL — AB (ref 8–23)
CO2: 19 mmol/L — AB (ref 22–32)
Calcium: 8.8 mg/dL — ABNORMAL LOW (ref 8.9–10.3)
Chloride: 103 mmol/L (ref 98–111)
Creatinine, Ser: 3.16 mg/dL — ABNORMAL HIGH (ref 0.44–1.00)
GFR calc Af Amer: 16 mL/min — ABNORMAL LOW (ref >60)
GFR, EST NON AFRICAN AMERICAN: 14 mL/min — AB (ref >60)
GLUCOSE: 92 mg/dL (ref 70–99)
POTASSIUM: 3.3 mmol/L — AB (ref 3.5–5.1)
Sodium: 136 mmol/L (ref 135–145)

## 2018-03-03 MED ORDER — SODIUM CHLORIDE 0.45 % IV SOLN
INTRAVENOUS | Status: DC
  Administered 2018-03-03 – 2018-03-04 (×3): via INTRAVENOUS

## 2018-03-03 MED ORDER — POTASSIUM CHLORIDE CRYS ER 20 MEQ PO TBCR
40.0000 meq | EXTENDED_RELEASE_TABLET | Freq: Once | ORAL | Status: AC
  Administered 2018-03-03: 40 meq via ORAL
  Filled 2018-03-03: qty 2

## 2018-03-03 MED ORDER — POLYETHYLENE GLYCOL 3350 17 G PO PACK
17.0000 g | PACK | Freq: Every day | ORAL | Status: DC
  Filled 2018-03-03 (×2): qty 1

## 2018-03-03 NOTE — Progress Notes (Signed)
Morgan Clay [id number] Oct 13, 1945  CARE TEAM:  PCP: [name], DO  Outpatient Care Team: Patient Care Team: [name], DO as PCP - General (Family Medicine) [name], MD as PCP - Cardiology (Cardiology) [name], MD as Consulting Physician (Nephrology) Ladene Artist, MD as Consulting Physician (Gastroenterology)  Inpatient Treatment Team: Treatment Team: Attending Provider: Kayleen Memos, DO; Consulting Physician: Edison Pace, Md, MD; Registered Nurse: Lolita Rieger, RN; Rounding Team: Fatima Blank, MD; Registered Nurse: Bobby Rumpf, RN; Technician: Romona Curls, NT; Technician: Melvyn Neth, NT; Case Manager: Dessa Phi, RN   Problem List:   Principal Problem:   Diverticulitis of sigmoid colon with abscess Active Problems:   Nonischemic cardiomyopathy ([place])   CKD (chronic kidney disease) stage 4, GFR 15-29 ml/min    Hypothyroidism   OBSTRUCTIVE SLEEP APNEA   Biventricular ICD (implantable cardioverter-defibrillator) in place   Type 2 diabetes, controlled, with renal manifestation ([place])   Atypical atrial flutter ([place])   Chronic obstructive pulmonary disease ([place])   Hypertension associated with diabetes ([place])   Pulmonary hypertension ([place]) on echocardiogram      * No surgery found [facility] Stay = 4 days  Assessment   Recurrent sigmoid diverticulitis with abscess near small bowel.  Ileus resolving  Plan:  Continue IV Zosyn antibiotics.  Tolerating liquid diet.  Does not particularly like full/dysphagia 1.  Had bowel movement this morning.  Will offer soft diet and see how she goes.  Low threshold to go back to sips only.  -If she is not consistently improving, repeat CAT scan on Monday, 5 days from last CAT scan.  See if the abscess has resolved or requires drainage.  She is a challenge to read as she seems somewhat stoic, but her bowels are opening up and her pain is less and she feels better today.  Hopeful  but guarded signs.  .  Ideally would like to avoid operative intervention this admission since likelihood of [place]mann resection with ostomy rather high.  She wishes to avoid that as well.  Most likely she would benefit from elective sigmoid colectomy in the future to break the cycle of attacks since now she has had a complicated attack and this is at least her third.  Give a chance for the abscess to resolve and then plan surgery in 6-8 weeks after inflammation has gone down.  More likely to do minimally invasive approach with immediate anastomosis and avoid temporary ostomy.  That can be discussed in the future.  She would require cardiac clearance.  Make sure her nephrologist is okay with surgery as well.  The patient is stable.  There is no evidence of peritonitis, acute abdomen, nor shock.  There is no strong evidence of failure of improvement nor decline with current non-operative management.  There is no need for surgery at the present moment.  We will continue to follow.    -VTE prophylaxis- SCDs, etc -mobilize as tolerated to help recovery  20 minutes spent in review, evaluation, examination, counseling, and coordination of care.  More than 50% of that time was spent in counseling.  03/03/2018    Subjective: (Chief complaint)  Crampy abdominal pain last night.  Had bowel movement.  Feels better now.  Tolerating liquids.  Does not like taste of dysphagia 1 food but no nausea vomiting or cramping.  Objective:  Vital signs:  Vitals:   03/01/18 1400 03/01/18 1403 03/02/18 1108 03/02/18 1313  BP:  [name])  147/61 125/60 (!) 113/59  Pulse:  (!) 59 60 (!) 59  Resp:  14  18  Temp: 97.7 F (36.5 C)   98.7 F (37.1 C)  TempSrc: Oral   Oral  SpO2:  99% 99% 99%    Last BM Date: 03/03/18  Intake/Output   Yesterday:  10/26 0701 - 10/27 0700 In: 917.4 [P.O.:720; I.V.:97.4; IV Piggyback:100] Out: -  This shift:  No intake/output data recorded.  Bowel function:  Flatus:  YES  BM:  YES  Drain: (No drain)   Physical Exam:  General: Pt awake/alert/oriented x4 in no acute distress.  Calm.  Not toxic/sickly Eyes: PERRL, normal EOM.  Sclera clear.  No icterus Neuro: CN II-XII intact w/o focal sensory/motor deficits. Lymph: No head/neck/groin lymphadenopathy Psych:  No delerium/psychosis/paranoia HENT: Normocephalic, Mucus membranes moist.  No thrush Neck: Supple, No tracheal deviation Chest: No chest wall pain w good excursion CV:  Pulses intact.  Regular rhythm MS: Normal AROM mjr joints.  No obvious deformity  Abdomen: Soft.  Nondistended.  Nontender.  No evidence of peritonitis.  No incarcerated hernias.  Ext:  No deformity.  No mjr edema.  No cyanosis Skin: No petechiae / purpura  Results:   Labs: Results for orders placed or performed during the hospital encounter of 02/27/18 (from the past 48 hour(s))  Glucose, capillary     Status: None   Collection Time: 03/01/18 12:09 PM  Result Value Ref Range   Glucose-Capillary 74 70 - 99 mg/dL  Glucose, capillary     Status: None   Collection Time: 03/01/18  4:49 PM  Result Value Ref Range   Glucose-Capillary 80 70 - 99 mg/dL  Glucose, capillary     Status: None   Collection Time: 03/01/18  7:58 PM  Result Value Ref Range   Glucose-Capillary 70 70 - 99 mg/dL  Basic metabolic panel     Status: Abnormal   Collection Time: 03/02/18  5:27 AM  Result Value Ref Range   Sodium 141 135 - 145 mmol/L   Potassium 3.6 3.5 - 5.1 mmol/L   Chloride 107 98 - 111 mmol/L   CO2 18 (L) 22 - 32 mmol/L   Glucose, Bld 71 70 - 99 mg/dL   BUN 31 (H) 8 - 23 mg/dL   Creatinine, Ser 2.50 (H) 0.44 - 1.00 mg/dL   Calcium 9.3 8.9 - 10.3 mg/dL   GFR calc non Af Amer 18 (L) >60 mL/min   GFR calc Af Amer 21 (L) >60 mL/min    Comment: (NOTE) The eGFR has been calculated using the CKD EPI equation. This calculation has not been validated in all clinical situations. eGFR's persistently <60 mL/min signify possible Chronic  Kidney Disease.    Anion gap 16 (H) 5 - 15    Comment: Performed at [facility], [place] [address], [place], [place] [address]  CBC     Status: Abnormal   Collection Time: 03/02/18  5:27 AM  Result Value Ref Range   WBC 8.4 4.0 - 10.5 K/uL   RBC 4.89 3.87 - 5.11 MIL/uL   Hemoglobin 13.0 12.0 - 15.0 g/dL   HCT 43.0 36.0 - 46.0 %   MCV 87.9 80.0 - 100.0 fL   MCH 26.6 26.0 - 34.0 pg   MCHC 30.2 30.0 - 36.0 g/dL   RDW 18.4 (H) 11.5 - 15.5 %   Platelets 285 150 - 400 K/uL   nRBC 0.0 0.0 - 0.2 %    Comment: Performed at [facility]  [name] [address] [place], [place] [address]  Glucose, capillary     Status: None   Collection Time: 03/02/18  7:38 AM  Result Value Ref Range   Glucose-Capillary 72 70 - 99 mg/dL   Comment 1 Notify RN    Comment 2 Document in Chart   Glucose, capillary     Status: Abnormal   Collection Time: 03/02/18 12:11 PM  Result Value Ref Range   Glucose-Capillary 62 (L) 70 - 99 mg/dL   Comment 1 Notify RN    Comment 2 Document in Chart   Glucose, capillary     Status: Abnormal   Collection Time: 03/02/18 12:46 PM  Result Value Ref Range   Glucose-Capillary 58 (L) 70 - 99 mg/dL   Comment 1 Notify RN    Comment 2 Document in Chart   Glucose, capillary     Status: Abnormal   Collection Time: 03/02/18  1:09 PM  Result Value Ref Range   Glucose-Capillary 161 (H) 70 - 99 mg/dL   Comment 1 Notify RN    Comment 2 Document in Chart   Glucose, capillary     Status: None   Collection Time: 03/02/18  5:17 PM  Result Value Ref Range   Glucose-Capillary 81 70 - 99 mg/dL   Comment 1 Notify RN    Comment 2 Document in Chart   Glucose, capillary     Status: Abnormal   Collection Time: 03/02/18  9:14 PM  Result Value Ref Range   Glucose-Capillary 112 (H) 70 - 99 mg/dL  Glucose, capillary     Status: None   Collection Time: 03/03/18  4:12 AM  Result Value Ref Range   Glucose-Capillary 97 70 - 99 mg/dL  Glucose, capillary      Status: None   Collection Time: 03/03/18  7:39 AM  Result Value Ref Range   Glucose-Capillary 88 70 - 99 mg/dL   Comment 1 Notify RN    Comment 2 Document in Chart   Basic metabolic panel     Status: Abnormal   Collection Time: 03/03/18  7:41 AM  Result Value Ref Range   Sodium 136 135 - 145 mmol/L   Potassium 3.3 (L) 3.5 - 5.1 mmol/L   Chloride 103 98 - 111 mmol/L   CO2 19 (L) 22 - 32 mmol/L   Glucose, Bld 92 70 - 99 mg/dL   BUN 33 (H) 8 - 23 mg/dL   Creatinine, Ser 3.16 (H) 0.44 - 1.00 mg/dL   Calcium 8.8 (L) 8.9 - 10.3 mg/dL   GFR calc non Af Amer 14 (L) >60 mL/min   GFR calc Af Amer 16 (L) >60 mL/min    Comment: (NOTE) The eGFR has been calculated using the CKD EPI equation. This calculation has not been validated in all clinical situations. eGFR's persistently <60 mL/min signify possible Chronic Kidney Disease.    Anion gap 14 5 - 15    Comment: Performed at [facility], [place] [address], [place], [place] [address]    Imaging / Studies: No results found.  Medications / Allergies: per chart  Antibiotics: Anti-infectives (From admission, onward)   Start     Dose/Rate Route Frequency Ordered Stop   03/02/18 1200  piperacillin-tazobactam (ZOSYN) IVPB 2.25 g     2.25 g 100 mL/hr over 30 Minutes Intravenous Every 6 hours 03/02/18 0834     02/28/18 1200  piperacillin-tazobactam (ZOSYN) IVPB 3.375 g  Status:  Discontinued     3.375 g  12.5 mL/hr over 240 Minutes Intravenous Every 8 hours 02/28/18 1043 03/02/18 0834   02/27/18 2315  metroNIDAZOLE (FLAGYL) IVPB 500 mg  Status:  Discontinued     500 mg 100 mL/hr over 60 Minutes Intravenous Every 8 hours 02/27/18 1709 02/27/18 1748   02/27/18 1845  piperacillin-tazobactam (ZOSYN) IVPB 2.25 g  Status:  Discontinued     2.25 g 100 mL/hr over 30 Minutes Intravenous Every 8 hours 02/27/18 1805 02/28/18 1043   02/27/18 1715  cefTRIAXone (ROCEPHIN) 1 g in sodium chloride 0.9 % 100 mL IVPB  Status:  Discontinued      1 g 200 mL/hr over 30 Minutes Intravenous Every 24 hours 02/27/18 1709 02/27/18 1748   02/27/18 1615  cefTRIAXone (ROCEPHIN) 2 g in sodium chloride 0.9 % 100 mL IVPB     2 g 200 mL/hr over 30 Minutes Intravenous  Once 02/27/18 1600 02/27/18 1653   02/27/18 1615  metroNIDAZOLE (FLAGYL) IVPB 500 mg  Status:  Discontinued     500 mg 100 mL/hr over 60 Minutes Intravenous  Once 02/27/18 1600 02/27/18 1748        Note: Portions of this report may have been transcribed using voice recognition software. Every effort was made to ensure accuracy; however, inadvertent computerized transcription errors may be present.   Any transcriptional errors that result from this process are unintentional.     Adin Hector, MD, FACS, MASCRS Gastrointestinal and Minimally Invasive Surgery    [address] [place], [place] [address] [phone] Main / Paging [phone] Fax

## 2018-03-03 NOTE — Progress Notes (Signed)
PROGRESS NOTE  Morgan Clay KGU:[id number] DOB: 1946-04-06 DOA: 02/27/2018 PCP: Morgan Kern, DO  HPI/Recap of past 24 hours: Morgan Clay a 72 y.o.femalewith medical history significant ofCHF, CKD stage III, type 2 diabetes, hypertension, hyperlipidemia, depression, diverticulitis, depression, OSA who presents to the emergency department from her PCPs office.Patient reported of having abdominal pain mainly on the left side since a week. Patient also reported of having chills at home and loose stools .She  underwent outpatient CT scan as per her PCP which showed sigmoid diverticulitis with 2.9 cm abscess.Surgery consulted and has been following.  03/03/2018: Patient seen and examined at bedside.  Denies abdominal pain or nausea.  Had a bowel movement this morning.  Surgery following.  Conservative management at this time.  Diet will be advanced today per Surgery  Assessment/Plan: Principal Problem:   Diverticulitis of sigmoid colon with abscess Active Problems:   Hypothyroidism   OBSTRUCTIVE SLEEP APNEA   Biventricular ICD (implantable cardioverter-defibrillator) in place   Nonischemic cardiomyopathy ([facility])   Type 2 diabetes, controlled, with renal manifestation ([facility])   CKD (chronic kidney disease) stage 4, GFR 15-29 ml/min    Atypical atrial flutter ([facility])   Chronic obstructive pulmonary disease ([place])   Hypertension associated with diabetes ([place])   Pulmonary hypertension ([place]) on echocardiogram  Recurrent diverticulitis with diverticular abscess Conservative management at this time Continue IV Zosyn Plan for possible surgery 6 to 8 weeks post discharge when inflammation has come down Advance diet as recommended by general surgery Monitor symptoms  AKI on CKD 3 Baseline creatinine 2.1 with GFR of 26 Creatinine today 3.16 Start gentle IV fluid hydration half-normal saline at 75 cc/h Monitor urine output Repeat creatinine tomorrow  Hypokalemia Potassium  3.3 Repleted with 40 mEq p.o. potassium once Repeat BMP in the morning  No anion gap metabolic acidosis suspect secondary to acute renal failure Bicarb 19 If no improvement will start isotonic bicarb  Paroxysmal a flutter Continue amiodarone and carvedilol Not on anticoagulation due to history of GI bleed with small bowel AVMs and gastric ulcer  Hypertension Blood pressures well controlled Continue home antihypertensive medications  Gout Discontinue allopurinol and colchicine due to AKI  Depression continue Zoloft  Hypothyroidism Continue Synthroid  OSA Does not use CPAP at home      Code Status: Full code  Family Communication: None at bedside  Disposition Plan: Home when hemodynamically stable possibly in 1 to 2 days or when surgery signs off.   Consultants:  General surgery  Procedures:  None  Antimicrobials:  IV Zosyn  DVT prophylaxis: Subcu heparin 3 times daily   Objective: Vitals:   03/01/18 1400 03/01/18 1403 03/02/18 1108 03/02/18 1313  BP:  (!) 147/61 125/60 (!) 113/59  Pulse:  (!) 59 60 (!) 59  Resp:  14  18  Temp: 97.7 F (36.5 C)   98.7 F (37.1 C)  TempSrc: Oral   Oral  SpO2:  99% 99% 99%    Intake/Output Summary (Last 24 hours) at 03/03/2018 1153 Last data filed at 03/03/2018 0300 Gross per 24 hour  Intake 677.35 ml  Output -  Net 677.35 ml   There were no vitals filed for this visit.  Exam:  . General: 72 y.o. year-old female well developed well nourished in no acute distress.  Alert and oriented x3. . Cardiovascular: Regular rate and rhythm with no rubs or gallops.  No thyromegaly or JVD noted.   [name] Respiratory: Clear to auscultation with no wheezes or rales. Good inspiratory effort. [name]  Abdomen: Soft nondistended with normal bowel sounds x4 quadrants.  Mild left lower quadrant pain to palpation. . Musculoskeletal: No lower extremity edema. 2/4 pulses in all 4 extremities. . Skin: No ulcerative lesions noted or  rashes, . Psychiatry: Mood is appropriate for condition and setting   Data Reviewed: CBC: Recent Labs  Lab 02/26/18 1513 02/27/18 1630 02/28/18 0549 03/01/18 0944 03/02/18 0527  WBC 7.6 8.0 7.1 8.5 8.4  NEUTROABS 5.4  --  5.3 6.5  --   HGB 13.9 14.2 12.7 12.6 13.0  HCT 42.3 45.8 42.0 40.3 43.0  MCV 85.7 86.6 88.2 85.7 87.9  PLT 230.0 296 276 263 545   Basic Metabolic Panel: Recent Labs  Lab 02/27/18 1730 02/28/18 0549 03/01/18 0600 03/02/18 0527 03/03/18 0741  NA 136 140 136 141 136  K 5.9* 3.5 3.6 3.6 3.3*  CL 101 107 104 107 103  CO2 20* 21* 21* 18* 19*  GLUCOSE 111* 113* 94 71 92  BUN 38* 35* 34* 31* 33*  CREATININE 2.26* 2.09* 2.15* 2.50* 3.16*  CALCIUM 8.6* 8.9 8.9 9.3 8.8*   GFR: Estimated Creatinine Clearance: 13.5 mL/min (A) (by C-G formula based on SCr of 3.16 mg/dL (H)). Liver Function Tests: Recent Labs  Lab 02/27/18 1730  AST 56*  ALT 30  ALKPHOS 80  BILITOT 1.8*  PROT 8.2*  ALBUMIN 3.7   Recent Labs  Lab 02/27/18 1730  LIPASE 39   No results for input(s): AMMONIA in the last 168 hours. Coagulation Profile: No results for input(s): INR, PROTIME in the last 168 hours. Cardiac Enzymes: No results for input(s): CKTOTAL, CKMB, CKMBINDEX, TROPONINI in the last 168 hours. BNP (last 3 results) No results for input(s): PROBNP in the last 8760 hours. HbA1C: No results for input(s): HGBA1C in the last 72 hours. CBG: Recent Labs  Lab 03/02/18 1717 03/02/18 2114 03/03/18 0412 03/03/18 0739 03/03/18 1134  GLUCAP 81 112* 97 88 120*   Lipid Profile: No results for input(s): CHOL, HDL, LDLCALC, TRIG, CHOLHDL, LDLDIRECT in the last 72 hours. Thyroid Function Tests: No results for input(s): TSH, T4TOTAL, FREET4, T3FREE, THYROIDAB in the last 72 hours. Anemia Panel: No results for input(s): VITAMINB12, FOLATE, FERRITIN, TIBC, IRON, RETICCTPCT in the last 72 hours. Urine analysis:    Component Value Date/Time   COLORURINE YELLOW 02/28/2018  0202   APPEARANCEUR CLEAR 02/28/2018 0202   LABSPEC 1.017 02/28/2018 0202   PHURINE 5.0 02/28/2018 0202   GLUCOSEU NEGATIVE 02/28/2018 0202   HGBUR NEGATIVE 02/28/2018 0202   HGBUR negative 02/28/2008 0000   BILIRUBINUR NEGATIVE 02/28/2018 0202   KETONESUR 5 (A) 02/28/2018 0202   PROTEINUR 100 (A) 02/28/2018 0202   UROBILINOGEN 1.0 07/19/2010 1752   NITRITE NEGATIVE 02/28/2018 0202   LEUKOCYTESUR NEGATIVE 02/28/2018 0202   Sepsis Labs: @LABRCNTIP (procalcitonin:4,lacticidven:4)  )No results found for this or any previous visit (from the past 240 hour(s)).    Studies: No results found.  Scheduled Meds: . allopurinol  100 mg Oral Daily  . amiodarone  200 mg Oral QHS  . carvedilol  25 mg Oral BID  . heparin  5,000 Units Subcutaneous Q8H  . insulin aspart  0-9 Units Subcutaneous TID WC  . isosorbide-hydrALAZINE  1 tablet Oral BID  . levothyroxine  125 mcg Oral QAC breakfast  . lip balm  1 application Topical BID  . pantoprazole  40 mg Oral Daily  . polyethylene glycol  17 g Oral Daily  . saccharomyces boulardii  250 mg Oral BID  . sertraline  75 mg Oral Daily    Continuous Infusions: . sodium chloride Stopped (03/03/18 1001)  . sodium chloride Stopped (03/03/18 0229)  . lactated ringers    . methocarbamol (ROBAXIN) IV Stopped (03/03/18 0052)  . piperacillin-tazobactam (ZOSYN)  IV 2.25 g (03/03/18 0504)     LOS: 4 days     Kayleen Memos, MD [facility] Pager [phone]  If 7PM-7AM, please contact night-coverage [email or web address] Password [email or web address] 03/03/2018, 11:53 AM

## 2018-03-04 ENCOUNTER — Inpatient Hospital Stay (HOSPITAL_COMMUNITY): Payer: Medicare Other

## 2018-03-04 DIAGNOSIS — N179 Acute kidney failure, unspecified: Secondary | ICD-10-CM

## 2018-03-04 LAB — BASIC METABOLIC PANEL
Anion gap: 14 (ref 5–15)
BUN: 31 mg/dL — ABNORMAL HIGH (ref 8–23)
CHLORIDE: 103 mmol/L (ref 98–111)
CO2: 15 mmol/L — ABNORMAL LOW (ref 22–32)
CREATININE: 3.64 mg/dL — AB (ref 0.44–1.00)
Calcium: 8.6 mg/dL — ABNORMAL LOW (ref 8.9–10.3)
GFR calc non Af Amer: 12 mL/min — ABNORMAL LOW (ref >60)
GFR, EST AFRICAN AMERICAN: 14 mL/min — AB (ref >60)
Glucose, Bld: 116 mg/dL — ABNORMAL HIGH (ref 70–99)
POTASSIUM: 3.7 mmol/L (ref 3.5–5.1)
SODIUM: 132 mmol/L — AB (ref 135–145)

## 2018-03-04 LAB — GLUCOSE, CAPILLARY
GLUCOSE-CAPILLARY: 130 mg/dL — AB (ref 70–99)
Glucose-Capillary: 91 mg/dL (ref 70–99)
Glucose-Capillary: 87 mg/dL (ref 70–99)
Glucose-Capillary: 78 mg/dL (ref 70–99)

## 2018-03-04 LAB — CBC
HEMATOCRIT: 40.4 % (ref 36.0–46.0)
Hemoglobin: 12.3 g/dL (ref 12.0–15.0)
MCH: 26.7 pg (ref 26.0–34.0)
MCHC: 30.4 g/dL (ref 30.0–36.0)
MCV: 87.6 fL (ref 80.0–100.0)
NRBC: 0 % (ref 0.0–0.2)
Platelets: 264 10*3/uL (ref 150–400)
RBC: 4.61 MIL/uL (ref 3.87–5.11)
RDW: 18.8 % — AB (ref 11.5–15.5)
WBC: 7.6 10*3/uL (ref 4.0–10.5)

## 2018-03-04 LAB — MAGNESIUM: MAGNESIUM: 1.8 mg/dL (ref 1.7–2.4)

## 2018-03-04 MED ORDER — IOHEXOL 300 MG/ML  SOLN
30.0000 mL | Freq: Once | INTRAMUSCULAR | Status: DC | PRN
  Administered 2018-03-04: 30 mL via ORAL
  Filled 2018-03-04: qty 30

## 2018-03-04 MED ORDER — METRONIDAZOLE IN NACL 5-0.79 MG/ML-% IV SOLN
500.0000 mg | Freq: Three times a day (TID) | INTRAVENOUS | Status: DC
  Administered 2018-03-04 – 2018-03-06 (×6): 500 mg via INTRAVENOUS
  Filled 2018-03-04 (×6): qty 100

## 2018-03-04 MED ORDER — HYDROMORPHONE HCL 1 MG/ML IJ SOLN
0.5000 mg | INTRAMUSCULAR | Status: DC | PRN
  Administered 2018-03-04: 0.5 mg via INTRAVENOUS
  Filled 2018-03-04: qty 0.5

## 2018-03-04 MED ORDER — STERILE WATER FOR INJECTION IV SOLN
INTRAVENOUS | Status: DC
  Administered 2018-03-04: 17:00:00 via INTRAVENOUS
  Filled 2018-03-04 (×2): qty 850

## 2018-03-04 MED ORDER — SODIUM CHLORIDE 0.9 % IV SOLN
1.0000 g | INTRAVENOUS | Status: DC
  Administered 2018-03-04 – 2018-03-05 (×2): 1 g via INTRAVENOUS
  Filled 2018-03-04 (×3): qty 1

## 2018-03-04 MED ORDER — OXYCODONE HCL 5 MG PO TABS
5.0000 mg | ORAL_TABLET | Freq: Four times a day (QID) | ORAL | Status: DC | PRN
  Administered 2018-03-05 – 2018-03-06 (×2): 5 mg via ORAL
  Filled 2018-03-04 (×2): qty 1

## 2018-03-04 NOTE — Progress Notes (Signed)
Pharmacy Antibiotic Note  Morgan Clay is a 72 y.o. female with a h/o CKD III admitted on 02/27/2018 with recurrent diverticulitis with abscess.  Patient appears to be high-risk. He received initial doses of ceftriaxone and metronidazole in ED & is currently on day#6 Zosyn.  Scr continues to worsen on Zosyn.  Pharmacy has been consulted to change antibiotics to Cipro + Flagyl.  Discussed with Dr Nevada Crane and will use Cefepime instead of Cipro due decrease risk of adverse effects in elderly with renal dysfunction and better Ecoli coverage.   03/04/2018: Day 6 abx Afebrile WBC WNL Hx CKD, Scr continues to increase, est CrCl ~80ml/min  Plan: Change Zosyn to Cefepime 1gm IV q24h F/U renal function     Temp (24hrs), Avg:98.4 F (36.9 C), Min:98.2 F (36.8 C), Max:98.6 F (37 C)  Recent Labs  Lab 02/27/18 1630  02/28/18 0549 03/01/18 0600 03/01/18 0944 03/02/18 0527 03/03/18 0741 03/04/18 0557  WBC 8.0  --  7.1  --  8.5 8.4  --  7.6  CREATININE  --    < > 2.09* 2.15*  --  2.50* 3.16* 3.64*   < > = values in this interval not displayed.    Estimated Creatinine Clearance: 11.8 mL/min (A) (by C-G formula based on SCr of 3.64 mg/dL (H)).    No Known Allergies  Antimicrobials this admission: Ceftriaxone and metronidazole 10/23  Zosyn 10/23 >> 10/28 Cefepime 10/28>>  Dose adjustments this admission: 10/24: Increase Zosyn 3.375gm IV Q8h to be infused over 4hrs  Microbiology results: none  Thank you for allowing pharmacy to be a part of this patient's care.  Biagio Borg 03/04/2018 4:39 PM

## 2018-03-04 NOTE — Care Management Important Message (Signed)
Important Message  Patient Details  Name: Morgan Clay MRN: [id number] Date of Birth: 01/24/1946   Medicare Important Message Given:  Yes    Kerin Salen 03/04/2018, 11:11 AMImportant Message  Patient Details  Name: Morgan Clay MRN: [id number] Date of Birth: 16-Jan-1946   Medicare Important Message Given:  Yes    Kerin Salen 03/04/2018, 11:11 AM

## 2018-03-04 NOTE — Progress Notes (Addendum)
PROGRESS NOTE  Morgan Clay LOV:[id number] DOB: March 16, 1946 DOA: 02/27/2018 PCP: [name], DO  HPI/Recap of past 24 hours: Morgan Clay a 72 y.o.femalewith medical history significant ofCHF, CKD stage III, type 2 diabetes, hypertension, hyperlipidemia, depression, diverticulitis, depression, OSA who presents to the emergency department from her PCPs office.Patient reported of having abdominal pain mainly on the left side since a week. Patient also reported of having chills at home and loose stools .She  underwent outpatient CT scan as per her PCP which showed sigmoid diverticulitis with 2.9 cm abscess.Surgery consulted and has been following.  03/03/2018: Patient seen and examined at bedside.  Denies abdominal pain or nausea.  Had a bowel movement this morning.  Surgery following.  Conservative management at this time.  Diet will be advanced today per Surgery.  03/04/2018: Patient seen and examined her bedside.  Reports intermittent mild left lower quadrant abdominal pain and loose stools at least 3 since this morning.  Nausea is improving.  Assessment/Plan: Principal Problem:   Diverticulitis of sigmoid colon with abscess Active Problems:   Hypothyroidism   OBSTRUCTIVE SLEEP APNEA   Biventricular ICD (implantable cardioverter-defibrillator) in place   Nonischemic cardiomyopathy (HCC)   Type 2 diabetes, controlled, with renal manifestation (HCC)   CKD (chronic kidney disease) stage 4, GFR 15-29 ml/min    Atypical atrial flutter (HCC)   Chronic obstructive pulmonary disease ([place])   Hypertension associated with diabetes ([place])   Pulmonary hypertension (HCC) on echocardiogram  Recurrent diverticulitis with diverticular abscess Conservative management at this time Continue IV Zosyn Plan for possible surgery 6 to 8 weeks post discharge when inflammation has come down Advance diet as recommended by general surgery Monitor symptoms  Worsening AKI on CKD 4 Baseline  creatinine 2.1 with GFR of 26 Creatinine today 3.64 from 3.16 Continue gentle IV fluid hydration half-normal saline at 75 cc/h Monitor urine output Obtain renal ultrasound Has been on Zosyn for 6 days for intra-abdominal abscess Consult nephrology Repeat BMP in the morning  Resolved hypokalemia post repletion  Anion gap metabolic acidosis suspect secondary to acute renal failure Bicarb 15 from 19 Start isotonic bicarb Consult nephrology  Paroxysmal a flutter Continue amiodarone and carvedilol Not on anticoagulation due to history of GI bleed with small bowel AVMs and gastric ulcer  Hypertension Blood pressures well controlled Continue home antihypertensive medications  Gout Discontinue allopurinol and colchicine due to AKI  Depression continue Zoloft  Hypothyroidism Continue Synthroid  OSA Does not use CPAP at home      Code Status: Full code  Family Communication: None at bedside  Disposition Plan: Home when renal function improves.  Consultants:  General surgery  Nephrology  Procedures:  None  Antimicrobials:  IV Zosyn  DVT prophylaxis: Subcu heparin 3 times daily   Objective: Vitals:   03/03/18 1334 03/03/18 2015 03/04/18 1425 03/04/18 1448  BP: (!) 116/48 134/60 (!) 162/73 136/68  Pulse: 60 (!) 59 63   Resp: 20 20 20    Temp: 98.5 F (36.9 C) 98.2 F (36.8 C) 98.6 F (37 C)   TempSrc: Oral Oral    SpO2: 95% 92% 96%     Intake/Output Summary (Last 24 hours) at 03/04/2018 1516 Last data filed at 03/04/2018 1500 Gross per 24 hour  Intake 2200.48 ml  Output 450 ml  Net 1750.48 ml   There were no vitals filed for this visit.  Exam:  . General: 72 y.o. year-old female well-developed well-nourished in no acute distress.  Alert oriented x3. . Cardiovascular: Regular  rate and rhythm with no rubs or gallops.  No JVD or thyromegaly noted.   [name] Respiratory: Clear to auscultation with no wheezes or rales. Good inspiratory  effort. . Abdomen: Soft nondistended with normal bowel sounds x4 quadrants.  Mild left lower quadrant pain to palpation. . Musculoskeletal: No lower extremity edema. 2/4 pulses in all 4 extremities. . Skin: No ulcerative lesions noted or rashes, . Psychiatry: Mood is appropriate for condition and setting   Data Reviewed: CBC: Recent Labs  Lab 02/26/18 1513 02/27/18 1630 02/28/18 0549 03/01/18 0944 03/02/18 0527 03/04/18 0557  WBC 7.6 8.0 7.1 8.5 8.4 7.6  NEUTROABS 5.4  --  5.3 6.5  --   --   HGB 13.9 14.2 12.7 12.6 13.0 12.3  HCT 42.3 45.8 42.0 40.3 43.0 40.4  MCV 85.7 86.6 88.2 85.7 87.9 87.6  PLT 230.0 296 276 263 285 967   Basic Metabolic Panel: Recent Labs  Lab 02/28/18 0549 03/01/18 0600 03/02/18 0527 03/03/18 0741 03/04/18 0557  NA 140 136 141 136 132*  K 3.5 3.6 3.6 3.3* 3.7  CL 107 104 107 103 103  CO2 21* 21* 18* 19* 15*  GLUCOSE 113* 94 71 92 116*  BUN 35* 34* 31* 33* 31*  CREATININE 2.09* 2.15* 2.50* 3.16* 3.64*  CALCIUM 8.9 8.9 9.3 8.8* 8.6*  MG  --   --   --   --  1.8   GFR: Estimated Creatinine Clearance: 11.8 mL/min (A) (by C-G formula based on SCr of 3.64 mg/dL (H)). Liver Function Tests: Recent Labs  Lab 02/27/18 1730  AST 56*  ALT 30  ALKPHOS 80  BILITOT 1.8*  PROT 8.2*  ALBUMIN 3.7   Recent Labs  Lab 02/27/18 1730  LIPASE 39   No results for input(s): AMMONIA in the last 168 hours. Coagulation Profile: No results for input(s): INR, PROTIME in the last 168 hours. Cardiac Enzymes: No results for input(s): CKTOTAL, CKMB, CKMBINDEX, TROPONINI in the last 168 hours. BNP (last 3 results) No results for input(s): PROBNP in the last 8760 hours. HbA1C: No results for input(s): HGBA1C in the last 72 hours. CBG: Recent Labs  Lab 03/03/18 1134 03/03/18 1725 03/03/18 1935 03/04/18 0740 03/04/18 1146  GLUCAP 120* 115* 123* 130* 91   Lipid Profile: No results for input(s): CHOL, HDL, LDLCALC, TRIG, CHOLHDL, LDLDIRECT in the last 72  hours. Thyroid Function Tests: No results for input(s): TSH, T4TOTAL, FREET4, T3FREE, THYROIDAB in the last 72 hours. Anemia Panel: No results for input(s): VITAMINB12, FOLATE, FERRITIN, TIBC, IRON, RETICCTPCT in the last 72 hours. Urine analysis:    Component Value Date/Time   COLORURINE YELLOW 02/28/2018 0202   APPEARANCEUR CLEAR 02/28/2018 0202   LABSPEC 1.017 02/28/2018 0202   PHURINE 5.0 02/28/2018 0202   GLUCOSEU NEGATIVE 02/28/2018 0202   HGBUR NEGATIVE 02/28/2018 0202   HGBUR negative 02/28/2008 0000   BILIRUBINUR NEGATIVE 02/28/2018 0202   KETONESUR 5 (A) 02/28/2018 0202   PROTEINUR 100 (A) 02/28/2018 0202   UROBILINOGEN 1.0 07/19/2010 1752   NITRITE NEGATIVE 02/28/2018 0202   LEUKOCYTESUR NEGATIVE 02/28/2018 0202   Sepsis Labs: @LABRCNTIP (procalcitonin:4,lacticidven:4)  )No results found for this or any previous visit (from the past 240 hour(s)).    Studies: No results found.  Scheduled Meds: . amiodarone  200 mg Oral QHS  . carvedilol  25 mg Oral BID  . heparin  5,000 Units Subcutaneous Q8H  . insulin aspart  0-9 Units Subcutaneous TID WC  . isosorbide-hydrALAZINE  1 tablet Oral BID  .  levothyroxine  125 mcg Oral QAC breakfast  . lip balm  1 application Topical BID  . pantoprazole  40 mg Oral Daily  . polyethylene glycol  17 g Oral Daily  . saccharomyces boulardii  250 mg Oral BID  . sertraline  75 mg Oral Daily    Continuous Infusions: . sodium chloride 75 mL/hr at 03/04/18 1500  . sodium chloride Stopped (03/03/18 0229)  . lactated ringers    . methocarbamol (ROBAXIN) IV Stopped (03/03/18 0052)  . piperacillin-tazobactam (ZOSYN)  IV Stopped (03/04/18 1245)     LOS: 5 days     Kayleen Memos, MD [facility] Pager [phone]  If 7PM-7AM, please contact night-coverage [email or web address] Password [email or web address] 03/04/2018, 3:16 PM

## 2018-03-04 NOTE — Progress Notes (Signed)
[facility] Progress Note     Subjective: CC-  States that she feels about the same as yesterday. Continues to have some LLQ abdominal pain. No worse with PO intake. Denies n/v. Tolerating soft diet. Reports several episodes of loose stool yesterday. WBC 7.6, afebrile.  Objective: Vital signs in last 24 hours: Temp:  [98.2 F (36.8 C)-98.5 F (36.9 C)] 98.2 F (36.8 C) (10/27 2015) Pulse Rate:  [59-60] 59 (10/27 2015) Resp:  [20] 20 (10/27 2015) BP: (116-134)/(48-60) 134/60 (10/27 2015) SpO2:  [92 %-95 %] 92 % (10/27 2015) Last BM Date: 03/03/18  Intake/Output from previous day: 10/27 0701 - 10/28 0700 In: 1932.7 [P.O.:510; I.V.:1222.7; IV Piggyback:200] Out: 500 [Urine:500] Intake/Output this shift: No intake/output data recorded.  PE: Gen:  Alert, NAD, pleasant HEENT: EOM's intact, pupils equal and round Pulm:  CTAB, no W/R/R, effort normal Abd: Soft, ND, TTP LLQ with voluntary guarding, +BS, no HSM Ext:  Calves soft and nontender Psych: A&Ox3  Skin: no rashes noted, warm and dry  Lab Results:  Recent Labs    03/02/18 0527 03/04/18 0557  WBC 8.4 7.6  HGB 13.0 12.3  HCT 43.0 40.4  PLT 285 264   BMET Recent Labs    03/03/18 0741 03/04/18 0557  NA 136 132*  K 3.3* 3.7  CL 103 103  CO2 19* 15*  GLUCOSE 92 116*  BUN 33* 31*  CREATININE 3.16* 3.64*  CALCIUM 8.8* 8.6*   PT/INR No results for input(s): LABPROT, INR in the last 72 hours. CMP     Component Value Date/Time   NA 132 (L) 03/04/2018 0557   NA 138 12/31/2017 0846   K 3.7 03/04/2018 0557   CL 103 03/04/2018 0557   CO2 15 (L) 03/04/2018 0557   GLUCOSE 116 (H) 03/04/2018 0557   BUN 31 (H) 03/04/2018 0557   BUN 99 (HH) 12/31/2017 0846   CREATININE 3.64 (H) 03/04/2018 0557   CREATININE 2.36 (H) 11/23/2017 1132   CREATININE 1.26 (H) 02/25/2016 1607   CALCIUM 8.6 (L) 03/04/2018 0557   PROT 8.2 (H) 02/27/2018 1730   PROT 8.3 07/19/2017 1503   ALBUMIN 3.7 02/27/2018 1730   ALBUMIN 4.9 (H) 07/19/2017 1503   AST 56 (H) 02/27/2018 1730   AST 14 (L) 11/23/2017 1132   ALT 30 02/27/2018 1730   ALT 9 11/23/2017 1132   ALKPHOS 80 02/27/2018 1730   BILITOT 1.8 (H) 02/27/2018 1730   BILITOT 0.3 11/23/2017 1132   GFRNONAA 12 (L) 03/04/2018 0557   GFRNONAA 20 (L) 11/23/2017 1132   GFRAA 14 (L) 03/04/2018 0557   GFRAA 23 (L) 11/23/2017 1132   Lipase     Component Value Date/Time   LIPASE 39 02/27/2018 1730       Studies/Results: No results found.  Anti-infectives: Anti-infectives (From admission, onward)   Start     Dose/Rate Route Frequency Ordered Stop   03/02/18 1200  piperacillin-tazobactam (ZOSYN) IVPB 2.25 g     2.25 g 100 mL/hr over 30 Minutes Intravenous Every 6 hours 03/02/18 0834     02/28/18 1200  piperacillin-tazobactam (ZOSYN) IVPB 3.375 g  Status:  Discontinued     3.375 g 12.5 mL/hr over 240 Minutes Intravenous Every 8 hours 02/28/18 1043 03/02/18 0834   02/27/18 2315  metroNIDAZOLE (FLAGYL) IVPB 500 mg  Status:  Discontinued     500 mg 100 mL/hr over 60 Minutes Intravenous Every 8 hours 02/27/18 1709 02/27/18 1748   02/27/18 1845  piperacillin-tazobactam (ZOSYN) IVPB 2.25 g  Status:  Discontinued     2.25 g 100 mL/hr over 30 Minutes Intravenous Every 8 hours 02/27/18 1805 02/28/18 1043   02/27/18 1715  cefTRIAXone (ROCEPHIN) 1 g in sodium chloride 0.9 % 100 mL IVPB  Status:  Discontinued     1 g 200 mL/hr over 30 Minutes Intravenous Every 24 hours 02/27/18 1709 02/27/18 1748   02/27/18 1615  cefTRIAXone (ROCEPHIN) 2 g in sodium chloride 0.9 % 100 mL IVPB     2 g 200 mL/hr over 30 Minutes Intravenous  Once 02/27/18 1600 02/27/18 1653   02/27/18 1615  metroNIDAZOLE (FLAGYL) IVPB 500 mg  Status:  Discontinued     500 mg 100 mL/hr over 60 Minutes Intravenous  Once 02/27/18 1600 02/27/18 1748       Assessment/Plan Ischemic cardiomyopathy Biventricular IVD Hx of PAF HTN Pulmonary Hypertension Hx of AF - not on anticoaguation AKI  on CKD-III - Cr up to 3.64 today Hx of GI bleed/gastric ulcer Gout Depression Hypothyroidism   Recurrent diverticulitis with small abscess - CT 10/23 showed inflammatory process involving the mid sigmoid colon and adjacent small bowel loop, with small abscess in the adjacent small bowel mesentery measuring 2.9 cm - WBC WNL, afebrile. Patient is tolerating soft diet and having bowel function but still TTP LLQ. Repeat CT scan today. Continue IV abx for now.  FEN: IVF, soft diet ID: Zosyn 10/23>> day 6 DVT: SCDs, sq heparin Follow-up: TBD    LOS: 5 days    Wellington Hampshire , [facility] 03/04/2018, 10:06 AM Pager: [phone] Mon 7:00 am -11:30 AM Tues-Fri 7:00 am-4:30 pm Sat-Sun 7:00 am-11:30 am

## 2018-03-05 ENCOUNTER — Inpatient Hospital Stay (HOSPITAL_COMMUNITY): Payer: Medicare Other

## 2018-03-05 LAB — CBC WITH DIFFERENTIAL/PLATELET
Abs Immature Granulocytes: 0.13 10*3/uL — ABNORMAL HIGH (ref 0.00–0.07)
BASOS ABS: 0.1 10*3/uL (ref 0.0–0.1)
BASOS PCT: 1 %
EOS ABS: 0.2 10*3/uL (ref 0.0–0.5)
EOS PCT: 2 %
HEMATOCRIT: 36.6 % (ref 36.0–46.0)
Hemoglobin: 11.6 g/dL — ABNORMAL LOW (ref 12.0–15.0)
Immature Granulocytes: 1 %
Lymphocytes Relative: 7 %
Lymphs Abs: 0.7 10*3/uL (ref 0.7–4.0)
MCH: 27 pg (ref 26.0–34.0)
MCHC: 31.7 g/dL (ref 30.0–36.0)
MCV: 85.1 fL (ref 80.0–100.0)
MONOS PCT: 9 %
Monocytes Absolute: 0.9 10*3/uL (ref 0.1–1.0)
Neutro Abs: 7.4 10*3/uL (ref 1.7–7.7)
Neutrophils Relative %: 80 %
Platelets: 245 10*3/uL (ref 150–400)
RBC: 4.3 MIL/uL (ref 3.87–5.11)
RDW: 18.3 % — AB (ref 11.5–15.5)
WBC: 9.3 10*3/uL (ref 4.0–10.5)
nRBC: 0 % (ref 0.0–0.2)

## 2018-03-05 LAB — COMPREHENSIVE METABOLIC PANEL
ALBUMIN: 3 g/dL — AB (ref 3.5–5.0)
ALT: 23 U/L (ref 0–44)
ANION GAP: 14 (ref 5–15)
AST: 29 U/L (ref 15–41)
Alkaline Phosphatase: 70 U/L (ref 38–126)
BILIRUBIN TOTAL: 1.1 mg/dL (ref 0.3–1.2)
BUN: 31 mg/dL — ABNORMAL HIGH (ref 8–23)
CO2: 16 mmol/L — ABNORMAL LOW (ref 22–32)
Calcium: 8.3 mg/dL — ABNORMAL LOW (ref 8.9–10.3)
Chloride: 100 mmol/L (ref 98–111)
Creatinine, Ser: 3.52 mg/dL — ABNORMAL HIGH (ref 0.44–1.00)
GFR calc Af Amer: 14 mL/min — ABNORMAL LOW (ref >60)
GFR, EST NON AFRICAN AMERICAN: 12 mL/min — AB (ref >60)
GLUCOSE: 78 mg/dL (ref 70–99)
POTASSIUM: 4.1 mmol/L (ref 3.5–5.1)
Sodium: 130 mmol/L — ABNORMAL LOW (ref 135–145)
TOTAL PROTEIN: 7.1 g/dL (ref 6.5–8.1)

## 2018-03-05 LAB — URINALYSIS, ROUTINE W REFLEX MICROSCOPIC
BILIRUBIN URINE: NEGATIVE
Glucose, UA: NEGATIVE mg/dL
Hgb urine dipstick: NEGATIVE
Ketones, ur: 5 mg/dL — AB
LEUKOCYTES UA: NEGATIVE
NITRITE: NEGATIVE
PH: 5 (ref 5.0–8.0)
Protein, ur: NEGATIVE mg/dL
SPECIFIC GRAVITY, URINE: 1.008 (ref 1.005–1.030)

## 2018-03-05 LAB — SODIUM, URINE, RANDOM: Sodium, Ur: <10 mmol/L

## 2018-03-05 LAB — GLUCOSE, CAPILLARY
GLUCOSE-CAPILLARY: 74 mg/dL (ref 70–99)
Glucose-Capillary: 85 mg/dL (ref 70–99)

## 2018-03-05 LAB — HIV ANTIBODY (ROUTINE TESTING W REFLEX): HIV SCREEN 4TH GENERATION: NONREACTIVE

## 2018-03-05 MED ORDER — SODIUM BICARBONATE 650 MG PO TABS
650.0000 mg | ORAL_TABLET | Freq: Two times a day (BID) | ORAL | Status: DC
  Administered 2018-03-05 – 2018-03-06 (×2): 650 mg via ORAL
  Filled 2018-03-05 (×2): qty 1

## 2018-03-05 MED ORDER — FUROSEMIDE 10 MG/ML IJ SOLN
80.0000 mg | Freq: Two times a day (BID) | INTRAMUSCULAR | Status: DC
  Administered 2018-03-05: 80 mg via INTRAVENOUS
  Filled 2018-03-05: qty 8

## 2018-03-05 NOTE — Progress Notes (Signed)
Telemetry called and stated pt was ringing out astoyle.  Pt was in the bathroom upon arrival to the room. Pt denies any chest pain, dizziness, heart palpations, or unusual symptoms. Will continue to monitor.

## 2018-03-05 NOTE — Progress Notes (Signed)
Patient ID: Morgan Clay, female   DOB: 05-10-1945, 72 y.o.   MRN: [id number] Request received for CT-guided drainage of abdominal abscess in patient.  Imaging studies were reviewed by Dr. Kathlene Cote and there is not a good percutaneous window to drain abscess at this time. Above d/w CCS PA.

## 2018-03-05 NOTE — Progress Notes (Signed)
[facility] Progress Note     Subjective: CC:  Reports mild, intermittent LLQ discomfort but denies true pain. Endorses intermittent nausea with an episode of emesis last night. Continues to report non-bloody diarrhea 7-10x/day. I discussed her CT results with her and her sister over the phone.  Objective: Vital signs in last 24 hours: Temp:  [98.3 F (36.8 C)-98.6 F (37 C)] 98.3 F (36.8 C) (10/29 0455) Pulse Rate:  [62-65] 62 (10/29 0455) Resp:  [19-20] 20 (10/29 0455) BP: (130-165)/(62-74) 130/62 (10/29 0455) SpO2:  [96 %-98 %] 96 % (10/29 0455) Weight:  [68.2 kg] 68.2 kg (10/29 0450) Last BM Date: 03/04/18  Intake/Output from previous day: 10/28 0701 - 10/29 0700 In: 2040.8 [P.O.:222; I.V.:1468.8; IV Piggyback:350] Out: -  Intake/Output this shift: No intake/output data recorded.  PE: Gen:  Alert, NAD, pleasant HEENT: anicteric sclerae, pupils equal and round Pulm:  CTAB, no W/R/R, effort normal Abd: Soft, ND, mild TTP LLQ without rebound or guarding, +BS, no palpable masses or organomegaly Psych: A&Ox3  Skin: no rashes noted, warm and dry  Lab Results:  Recent Labs    03/04/18 0557 03/05/18 0555  WBC 7.6 9.3  HGB 12.3 11.6*  HCT 40.4 36.6  PLT 264 245   BMET Recent Labs    03/04/18 0557 03/05/18 0555  NA 132* 130*  K 3.7 4.1  CL 103 100  CO2 15* 16*  GLUCOSE 116* 78  BUN 31* 31*  CREATININE 3.64* 3.52*  CALCIUM 8.6* 8.3*   PT/INR No results for input(s): LABPROT, INR in the last 72 hours. CMP     Component Value Date/Time   NA 130 (L) 03/05/2018 0555   NA 138 12/31/2017 0846   K 4.1 03/05/2018 0555   CL 100 03/05/2018 0555   CO2 16 (L) 03/05/2018 0555   GLUCOSE 78 03/05/2018 0555   BUN 31 (H) 03/05/2018 0555   BUN 99 (HH) 12/31/2017 0846   CREATININE 3.52 (H) 03/05/2018 0555   CREATININE 2.36 (H) 11/23/2017 1132   CREATININE 1.26 (H) 02/25/2016 1607   CALCIUM 8.3 (L) 03/05/2018 0555   PROT 7.1 03/05/2018 0555   PROT 8.3  07/19/2017 1503   ALBUMIN 3.0 (L) 03/05/2018 0555   ALBUMIN 4.9 (H) 07/19/2017 1503   AST 29 03/05/2018 0555   AST 14 (L) 11/23/2017 1132   ALT 23 03/05/2018 0555   ALT 9 11/23/2017 1132   ALKPHOS 70 03/05/2018 0555   BILITOT 1.1 03/05/2018 0555   BILITOT 0.3 11/23/2017 1132   GFRNONAA 12 (L) 03/05/2018 0555   GFRNONAA 20 (L) 11/23/2017 1132   GFRAA 14 (L) 03/05/2018 0555   GFRAA 23 (L) 11/23/2017 1132   Lipase     Component Value Date/Time   LIPASE 39 02/27/2018 1730       Studies/Results: Ct Abdomen Pelvis Wo Contrast  Result Date: 03/04/2018 CLINICAL DATA:  72 year old female with left lower quadrant pain for 3 days and diarrhea. History of diverticulitis. Subsequent encounter. EXAM: CT ABDOMEN AND PELVIS WITHOUT CONTRAST TECHNIQUE: Multidetector CT imaging of the abdomen and pelvis was performed following the standard protocol without IV contrast. COMPARISON:  02/27/2018. FINDINGS: Lower chest: Interval worsening of bibasilar parenchymal changes and development of small pleural effusions greater on the right. This raises possibility of mild pulmonary edema. Cardiomegaly with biventricular pacer/AICD in place. Dense breast parenchyma. Hepatobiliary: Cirrhotic liver. Taking into account limitation by non contrast imaging, no focal mass identified. No calcified gallstones. Possible sludge/noncalcified gallstones. Pancreas: Taking into account limitation by  non contrast imaging, no worrisome pancreatic mass or inflammation. Spleen: Taking into account limitation by non contrast imaging, no splenic mass or enlargement. Adrenals/Urinary Tract: No obstructing stone or hydronephrosis. Taking into account limitation by non contrast imaging, no worrisome renal, adrenal or urinary bladder lesion. Stomach/Bowel: Inflammatory process/abscess which spans between the sigmoid colon and small bowel appears minimally more prominent compared to the 02/27/2018 CT, currently measuring 3.6 x 3 x 3.1 cm  versus prior 3.2 x 2.9 x 2.9 cm. This is most likely related to sigmoid diverticulitis with secondary inflammation of small bowel loop. As small bowel loops surrounding this abscess are not completely fill with contrast, it is difficult to adequately assess small bowel. Mild wall thickening of jejunum proximal to the abscess may represent changes of enteritis and new from the prior exam. Primary enteritis as cause for abscesses felt to be less likely consideration. Vascular/Lymphatic: Atherosclerotic changes aorta and aortic branch vessels without abdominal aortic aneurysm. Scattered normal to top-normal size lymph nodes may be reactive in origin. Reproductive: Calcified uterine fibroid impresses upon the dome of the bladder. No worrisome adnexal mass. Other: No free air seen separate from abscess collection. Minimal third spacing of fluid. Musculoskeletal: No acute osseous abnormality. IMPRESSION: 12 1. Inflammatory process/abscess which spans between the sigmoid colon and small bowel appears minimally more prominent compared to the 02/27/2018 CT, currently measuring 3.6 x 3 x 3.1 cm versus prior 3.2 x 2.9 x 2.9 cm. This is most likely related to sigmoid diverticulitis with secondary inflammation of small bowel loop. As small bowel loops surrounding this abscess are not completely fill with contrast, it is difficult to adequately assess small bowel in this region. 2. Mild wall thickening of jejunum proximal to the abscess may represent changes of enteritis and new from the prior exam. Primary enteritis as cause for abscess is felt to be a less likely consideration (based on appearance from 02/27/2018 exam). 3. Interval worsening of bibasilar parenchymal changes and development of small pleural effusions greater on the right. This raises possibility of mild pulmonary edema. 4. Cardiomegaly with biventricular pacer/AICD in place. 5. Cirrhosis. 6.  Aortic Atherosclerosis (ICD10-I70.0). 7. Minimal third spacing of  fluid. Electronically Signed   By: Genia Del M.D.   On: 03/04/2018 17:10   US Renal  Result Date: 03/05/2018 CLINICAL DATA:  Acute kidney injury EXAM: RENAL / URINARY TRACT ULTRASOUND COMPLETE COMPARISON:  03/04/2018 FINDINGS: Right Kidney: Length: 10.0 cm. Increased echotexture diffusely. No mass or hydronephrosis. Left Kidney: Length: 10.0 cm. Increased echotexture diffusely. No mass or hydronephrosis. Bladder: Appears normal for degree of bladder distention. Small right effusion. IMPRESSION: Increased echotexture in the kidneys bilaterally compatible with chronic medical renal disease. No hydronephrosis. Small right effusion. Electronically Signed   By: Rolm Baptise M.D.   On: 03/05/2018 10:07    Anti-infectives: Anti-infectives (From admission, onward)   Start     Dose/Rate Route Frequency Ordered Stop   03/04/18 2200  ceFEPIme (MAXIPIME) 1 g in sodium chloride 0.9 % 100 mL IVPB     1 g 200 mL/hr over 30 Minutes Intravenous Every 24 hours 03/04/18 1646     03/04/18 2000  metroNIDAZOLE (FLAGYL) IVPB 500 mg     500 mg 100 mL/hr over 60 Minutes Intravenous Every 8 hours 03/04/18 1535     03/02/18 1200  piperacillin-tazobactam (ZOSYN) IVPB 2.25 g  Status:  Discontinued     2.25 g 100 mL/hr over 30 Minutes Intravenous Every 6 hours 03/02/18 0834 03/04/18 1535  02/28/18 1200  piperacillin-tazobactam (ZOSYN) IVPB 3.375 g  Status:  Discontinued     3.375 g 12.5 mL/hr over 240 Minutes Intravenous Every 8 hours 02/28/18 1043 03/02/18 0834   02/27/18 2315  metroNIDAZOLE (FLAGYL) IVPB 500 mg  Status:  Discontinued     500 mg 100 mL/hr over 60 Minutes Intravenous Every 8 hours 02/27/18 1709 02/27/18 1748   02/27/18 1845  piperacillin-tazobactam (ZOSYN) IVPB 2.25 g  Status:  Discontinued     2.25 g 100 mL/hr over 30 Minutes Intravenous Every 8 hours 02/27/18 1805 02/28/18 1043   02/27/18 1715  cefTRIAXone (ROCEPHIN) 1 g in sodium chloride 0.9 % 100 mL IVPB  Status:  Discontinued     1 g 200  mL/hr over 30 Minutes Intravenous Every 24 hours 02/27/18 1709 02/27/18 1748   02/27/18 1615  cefTRIAXone (ROCEPHIN) 2 g in sodium chloride 0.9 % 100 mL IVPB     2 g 200 mL/hr over 30 Minutes Intravenous  Once 02/27/18 1600 02/27/18 1653   02/27/18 1615  metroNIDAZOLE (FLAGYL) IVPB 500 mg  Status:  Discontinued     500 mg 100 mL/hr over 60 Minutes Intravenous  Once 02/27/18 1600 02/27/18 1748       Assessment/Plan Ischemic cardiomyopathy Biventricular IVD Hx of PAF HTN Pulmonary Hypertension Hx of AF - not on anticoaguation AKI on CKD-III - Cr up to 3.64 today Hx of GI bleed/gastric ulcer Gout Depression Hypothyroidism   Recurrent diverticulitis with small abscess - CT 10/23 showed inflammatory process involving the mid sigmoid colon and adjacent small bowel  with small abscess in the adjacent SB mesentery. - CT repeated 10/28 due to persistent pain shows persistent inflammatory changes and slight increase in prominence of abscess (3.6x3x3 cm compared to 3.2x2.9x2.9 cm) - WBC WNL, afebrile. Patient is overall clinically improving but continues to have diarrhea and intermittent N/V. Will see if IR has a window for drainage of abscess to improve patient symptoms.  - continue with conservative management. Resume SOFT diet if patient unable to have IR procedure. May be able to transition to PO abx tomorrow.    FEN:IVF, soft diet ID: Zosyn 10/23>> day7  DVT: SCDs, sq heparin Follow-up: Dr. Johney Maine        LOS: 6 days    Obie Dredge, [facility] Pager: [phone]

## 2018-03-05 NOTE — Progress Notes (Addendum)
PROGRESS NOTE  Morgan Clay OZH:[id number] DOB: [birth year] DOA: 02/27/2018 PCP: [name], DO  HPI/Recap of past 24 hours: Clarene Clay a 72 y.o.femalewith medical history significant ofCHF, CKD stage IV, type 2 diabetes, hypertension, hyperlipidemia, depression, diverticulitis, depression, OSA who presents to the emergency department from her PCPs office.Patient reported having abdominal pain mainly on the left side since a week. Patient also reported of having chills at home and loose stools, 10 daily on average.  She underwent outpatient CT scan as per her PCP which showed sigmoid diverticulitis with 2.9 cm abscess.  Surgery consulted and following.  Hospital course complicated by AKI on CKD 4.  Nephrology consulted and following.  03/05/2018: Patient seen and examined at bedside.  No acute events overnight.  Soft stools with intermittent nausea.  Also reports mild left lower quadrant pain.    Assessment/Plan: Principal Problem:   Diverticulitis of sigmoid colon with abscess Active Problems:   Hypothyroidism   OBSTRUCTIVE SLEEP APNEA   Biventricular ICD (implantable cardioverter-defibrillator) in place   Nonischemic cardiomyopathy (HCC)   Type 2 diabetes, controlled, with renal manifestation (HCC)   CKD (chronic kidney disease) stage 4, GFR 15-29 ml/min    Atypical atrial flutter (HCC)   Chronic obstructive pulmonary disease ([place])   Hypertension associated with diabetes ([place])   Pulmonary hypertension (HCC) on echocardiogram  Recurrent diverticulitis with diverticular abscess Worsening diverticular abscess on CT abdomen pelvis without contrast Per interventional radiology abscess cannot be drained due to position Conservative management at this time Discontinued Zosyn (6 days ) due to AKI Started IV Flagyl and cefepime on 03/04/2018 Advance diet as tolerated  AKI on CKD 4 Baseline creatinine 2.1 with GFR of 26 Slight improvement in creatinine from  3.64-3.5 to  DC IV fluid as recommended by nephrology  Renal ultrasound unrevealing  Continue to avoid nephrotoxic agents Monitor urine output Repeat BMP in the morning  Hypervolemic hyponatremia Stop IV fluid Diuretics as recommended by nephrology Repeat BMP in the morning  Right pleural effusion suspect from fluid overload multifactorial secondary to advanced renal failure versus iatrogenic Does not appear to be cardiogenic Independently reviewed CT abdomen and pelvis done on 03/04/2018 which revealed small right pleural effusion Stop IV fluid Diuretics Stable oxygen saturation  Resolved hypokalemia post repletion  Anion gap metabolic acidosis suspect secondary to acute renal failure On sodium bicarb by nephrology  Repeat BMP in the morning  Paroxysmal a flutter Continue amiodarone and carvedilol Not on anticoagulation due to history of GI bleed with small bowel AVMs and gastric ulcer  Hypertension Blood pressures well controlled Continue home antihypertensive medications  Gout Discontinue allopurinol and colchicine due to AKI  Depression continue Zoloft  Hypothyroidism Continue Synthroid  OSA Does not use CPAP at home      Code Status: Full code  Family Communication: None at bedside  Disposition Plan: Home when renal function improves and nephrology/general surgery sign off.  Consultants:  General surgery  Nephrology  Procedures:  None  Antimicrobials:  IV Zosyn  DVT prophylaxis: Subcu heparin 3 times daily   Objective: Vitals:   03/04/18 1448 03/04/18 2035 03/05/18 0450 03/05/18 0455  BP: 136/68 (!) 165/74  130/62  Pulse:  65  62  Resp:  19  20  Temp:  98.3 F (36.8 C)  98.3 F (36.8 C)  TempSrc:      SpO2:  98%  96%  Weight:   68.2 kg     Intake/Output Summary (Last 24 hours) at 03/05/2018 1337 Last data  filed at 03/05/2018 0858 Gross per 24 hour  Intake 1488.44 ml  Output -  Net 1488.44 ml   Filed Weights   03/05/18  0450  Weight: 68.2 kg    Exam:  . General: 72 y.o. year-old female well developed well-nourished in no acute distress.  Alert oriented x3. . Cardiovascular: Regular rate and rhythm with no rubs or gallops.  No JVD or thyromegaly noted. [name] Respiratory: Mild crackles at bases.  No wheezes with good inspiratory effort. . Abdomen: Soft mild tenderness on left lower quadrant on palpation.  Normal bowel sounds x4 quadrant. . Musculoskeletal: No lower extremity edema. 2/4 pulses in all 4 extremities. . Skin: No ulcerative lesions noted or rashes, . Psychiatry: Mood is appropriate for condition and setting   Data Reviewed: CBC: Recent Labs  Lab 02/26/18 1513  02/28/18 0549 03/01/18 0944 03/02/18 0527 03/04/18 0557 03/05/18 0555  WBC 7.6   < > 7.1 8.5 8.4 7.6 9.3  NEUTROABS 5.4  --  5.3 6.5  --   --  7.4  HGB 13.9   < > 12.7 12.6 13.0 12.3 11.6*  HCT 42.3   < > 42.0 40.3 43.0 40.4 36.6  MCV 85.7   < > 88.2 85.7 87.9 87.6 85.1  PLT 230.0   < > 276 263 285 264 245   < > = values in this interval not displayed.   Basic Metabolic Panel: Recent Labs  Lab 03/01/18 0600 03/02/18 0527 03/03/18 0741 03/04/18 0557 03/05/18 0555  NA 136 141 136 132* 130*  K 3.6 3.6 3.3* 3.7 4.1  CL 104 107 103 103 100  CO2 21* 18* 19* 15* 16*  GLUCOSE 94 71 92 116* 78  BUN 34* 31* 33* 31* 31*  CREATININE 2.15* 2.50* 3.16* 3.64* 3.52*  CALCIUM 8.9 9.3 8.8* 8.6* 8.3*  MG  --   --   --  1.8  --    GFR: Estimated Creatinine Clearance: 12.5 mL/min (A) (by C-G formula based on SCr of 3.52 mg/dL (H)). Liver Function Tests: Recent Labs  Lab 02/27/18 1730 03/05/18 0555  AST 56* 29  ALT 30 23  ALKPHOS 80 70  BILITOT 1.8* 1.1  PROT 8.2* 7.1  ALBUMIN 3.7 3.0*   Recent Labs  Lab 02/27/18 1730  LIPASE 39   No results for input(s): AMMONIA in the last 168 hours. Coagulation Profile: No results for input(s): INR, PROTIME in the last 168 hours. Cardiac Enzymes: No results for input(s): CKTOTAL,  CKMB, CKMBINDEX, TROPONINI in the last 168 hours. BNP (last 3 results) No results for input(s): PROBNP in the last 8760 hours. HbA1C: No results for input(s): HGBA1C in the last 72 hours. CBG: Recent Labs  Lab 03/04/18 1146 03/04/18 1548 03/04/18 2036 03/05/18 0731 03/05/18 1205  GLUCAP 91 87 78 85 74   Lipid Profile: No results for input(s): CHOL, HDL, LDLCALC, TRIG, CHOLHDL, LDLDIRECT in the last 72 hours. Thyroid Function Tests: No results for input(s): TSH, T4TOTAL, FREET4, T3FREE, THYROIDAB in the last 72 hours. Anemia Panel: No results for input(s): VITAMINB12, FOLATE, FERRITIN, TIBC, IRON, RETICCTPCT in the last 72 hours. Urine analysis:    Component Value Date/Time   COLORURINE YELLOW 03/05/2018 0902   APPEARANCEUR CLEAR 03/05/2018 0902   LABSPEC 1.008 03/05/2018 0902   PHURINE 5.0 03/05/2018 0902   GLUCOSEU NEGATIVE 03/05/2018 0902   HGBUR NEGATIVE 03/05/2018 0902   HGBUR negative 02/28/2008 0000   BILIRUBINUR NEGATIVE 03/05/2018 0902   KETONESUR 5 (A) 03/05/2018 0902   PROTEINUR  NEGATIVE 03/05/2018 0902   UROBILINOGEN 1.0 07/19/2010 1752   NITRITE NEGATIVE 03/05/2018 0902   LEUKOCYTESUR NEGATIVE 03/05/2018 0902   Sepsis Labs: @LABRCNTIP (procalcitonin:4,lacticidven:4)  )No results found for this or any previous visit (from the past 240 hour(s)).    Studies: Ct Abdomen Pelvis Wo Contrast  Result Date: 03/04/2018 CLINICAL DATA:  72 year old female with left lower quadrant pain for 3 days and diarrhea. History of diverticulitis. Subsequent encounter. EXAM: CT ABDOMEN AND PELVIS WITHOUT CONTRAST TECHNIQUE: Multidetector CT imaging of the abdomen and pelvis was performed following the standard protocol without IV contrast. COMPARISON:  02/27/2018. FINDINGS: Lower chest: Interval worsening of bibasilar parenchymal changes and development of small pleural effusions greater on the right. This raises possibility of mild pulmonary edema. Cardiomegaly with biventricular  pacer/AICD in place. Dense breast parenchyma. Hepatobiliary: Cirrhotic liver. Taking into account limitation by non contrast imaging, no focal mass identified. No calcified gallstones. Possible sludge/noncalcified gallstones. Pancreas: Taking into account limitation by non contrast imaging, no worrisome pancreatic mass or inflammation. Spleen: Taking into account limitation by non contrast imaging, no splenic mass or enlargement. Adrenals/Urinary Tract: No obstructing stone or hydronephrosis. Taking into account limitation by non contrast imaging, no worrisome renal, adrenal or urinary bladder lesion. Stomach/Bowel: Inflammatory process/abscess which spans between the sigmoid colon and small bowel appears minimally more prominent compared to the 02/27/2018 CT, currently measuring 3.6 x 3 x 3.1 cm versus prior 3.2 x 2.9 x 2.9 cm. This is most likely related to sigmoid diverticulitis with secondary inflammation of small bowel loop. As small bowel loops surrounding this abscess are not completely fill with contrast, it is difficult to adequately assess small bowel. Mild wall thickening of jejunum proximal to the abscess may represent changes of enteritis and new from the prior exam. Primary enteritis as cause for abscesses felt to be less likely consideration. Vascular/Lymphatic: Atherosclerotic changes aorta and aortic branch vessels without abdominal aortic aneurysm. Scattered normal to top-normal size lymph nodes may be reactive in origin. Reproductive: Calcified uterine fibroid impresses upon the dome of the bladder. No worrisome adnexal mass. Other: No free air seen separate from abscess collection. Minimal third spacing of fluid. Musculoskeletal: No acute osseous abnormality. IMPRESSION: 12 1. Inflammatory process/abscess which spans between the sigmoid colon and small bowel appears minimally more prominent compared to the 02/27/2018 CT, currently measuring 3.6 x 3 x 3.1 cm versus prior 3.2 x 2.9 x 2.9 cm. This  is most likely related to sigmoid diverticulitis with secondary inflammation of small bowel loop. As small bowel loops surrounding this abscess are not completely fill with contrast, it is difficult to adequately assess small bowel in this region. 2. Mild wall thickening of jejunum proximal to the abscess may represent changes of enteritis and new from the prior exam. Primary enteritis as cause for abscess is felt to be a less likely consideration (based on appearance from 02/27/2018 exam). 3. Interval worsening of bibasilar parenchymal changes and development of small pleural effusions greater on the right. This raises possibility of mild pulmonary edema. 4. Cardiomegaly with biventricular pacer/AICD in place. 5. Cirrhosis. 6.  Aortic Atherosclerosis (ICD10-I70.0). 7. Minimal third spacing of fluid. Electronically Signed   By: Genia Del M.D.   On: 03/04/2018 17:10   US Renal  Result Date: 03/05/2018 CLINICAL DATA:  Acute kidney injury EXAM: RENAL / URINARY TRACT ULTRASOUND COMPLETE COMPARISON:  03/04/2018 FINDINGS: Right Kidney: Length: 10.0 cm. Increased echotexture diffusely. No mass or hydronephrosis. Left Kidney: Length: 10.0 cm. Increased echotexture diffusely. No mass or  hydronephrosis. Bladder: Appears normal for degree of bladder distention. Small right effusion. IMPRESSION: Increased echotexture in the kidneys bilaterally compatible with chronic medical renal disease. No hydronephrosis. Small right effusion. Electronically Signed   By: Rolm Baptise M.D.   On: 03/05/2018 10:07    Scheduled Meds: . amiodarone  200 mg Oral QHS  . carvedilol  25 mg Oral BID  . furosemide  80 mg Intravenous Q12H  . heparin  5,000 Units Subcutaneous Q8H  . isosorbide-hydrALAZINE  1 tablet Oral BID  . levothyroxine  125 mcg Oral QAC breakfast  . lip balm  1 application Topical BID  . pantoprazole  40 mg Oral Daily  . polyethylene glycol  17 g Oral Daily  . saccharomyces boulardii  250 mg Oral BID  .  sertraline  75 mg Oral Daily  . sodium bicarbonate  650 mg Oral BID    Continuous Infusions: . sodium chloride 10 mL/hr at 03/05/18 0453  . ceFEPime (MAXIPIME) IV Stopped (03/04/18 2301)  . lactated ringers    . methocarbamol (ROBAXIN) IV Stopped (03/03/18 0052)  . metronidazole 500 mg (03/05/18 1228)     LOS: 6 days     Kayleen Memos, MD [facility] Pager [phone]  If 7PM-7AM, please contact night-coverage [email or web address] Password [email or web address] 03/05/2018, 1:37 PM

## 2018-03-05 NOTE — Consult Note (Signed)
Referring Provider: No ref. provider found Primary Care Physician:  [name], DO Primary Nephrologist:    Reason for Consultation: Acute kidney injury, abdominal abscess, chronic kidney disease stage III/IV Baseline creatinine appears to be between 2.5 and 3.0.  Anemia, Hepatitis  C status post treatment with Harvoni, atrial fibrillation and hypertension  HPI: This very pleasant lady who is a history of congestive heart failure diastolic dysfunction ejection fraction 55 to 60%, hypertension hyperlipidemia type 2 diabetes and diverticular abscess.  Patient was admitted 02/27/2018.  Patient was taking torsemide and potassium supplementation but no ACE inhibitors ARB is no nonsteroidal anti-inflammatory drugs.  She appears at baseline serum creatinine is running anywhere from between 2.5 and 3.0 mg/dL.  Urine output has not been well recorded and it appears that she has had a diminished urine output since admission with 200 cc of urine produced on 03/04/2018 and 300 cc on 03/03/2018.  He has a diverticular abscess measuring 2.9 cm and is scheduled for interventional radiology to drain this abscess later to 03/05/2018.    Renal ultrasound does not reveal any hydronephrosis and renal size appears to be symmetrical.  She was placed on Zosyn and has been on that since admission.  She was switched yesterday to Flagyl and cefepime.   Vital signs do not appear to have any evidence of profound hypotension. Blood pressure 130/60 pulse 62 weight 68.2 kg O2 sats 96% room air.  Sodium 130 potassium 4.1 chloride 100 CO2 16 BUN 31 creatinine 3.52 glucose 78 albumin 3.0 AST 29 ALT 23 WBC 9.3 hemoglobin 11.6 platelets 245   Past Medical History:  Diagnosis Date  . A-fib ([place])    on Eliquis  . AICD (automatic cardioverter/defibrillator) present 10/06/2015  . Anemia   . Arthritis    "qwhere" (01/03/2018)  . Asthma    reports mild asthma since childhood - had COPD on dx list from prior PCP  . Chronic  bronchitis ([place])    "get it most years; not this past year though" (01/03/2018)  . Chronic kidney disease    "? stage;  followed by [facility]; [name]" (01/03/2018)  . Chronic systolic congestive heart failure ([place])   . DEPRESSION 12/17/2009   Annotation: PHQ-9 score = 14 done on 12/17/2009 Qualifier: Diagnosis of  By: Hassell Done FNP, Tori Milks    . Diverticulosis   . FIBROIDS, UTERUS 03/05/2008  . Glaucoma   . Hepatitis C    HEPATITIS C - s/p treatment with Harvoni, saw hepatology, Dawn Drazek  . History of blood transfusion ~ 11/2017  . Hyperlipemia 12/06/2012  . HYPERTENSION, BENIGN 04/24/2007  . Hypothyroidism   . LBBB (left bundle branch block)   . Lower GI bleeding    "been dealing w/it since 07/2017" (01/03/2018)  . Nonischemic cardiomyopathy ([place])   . OBESITY 05/27/2009  . OBSTRUCTIVE SLEEP APNEA 11/14/2007   no CPAP  . OSTEOPENIA 09/30/2008  . Personal history of other infectious and parasitic disease    Hepatitis B  . Pneumonia    "several times" (01/03/2018)    Past Surgical History:  Procedure Laterality Date  . APPENDECTOMY    . CARDIAC CATHETERIZATION    . CARDIOVERSION N/A 12/14/2014   Procedure: CARDIOVERSION;  Surgeon: Dorothy Spark, MD;  Location: [facility] ENDOSCOPY;  Service: Cardiovascular;  Laterality: N/A;  . CARDIOVERSION N/A 02/26/2017   Procedure: CARDIOVERSION;  Surgeon: Evans Lance, MD;  Location: [place] CV LAB;  Service: Cardiovascular;  Laterality: N/A;  . CARDIOVERSION N/A 07/24/2017  Procedure: CARDIOVERSION;  Surgeon: Thayer Headings, MD;  Location: [facility] ENDOSCOPY;  Service: Cardiovascular;  Laterality: N/A;  . COLONOSCOPY N/A 11/08/2017   Procedure: COLONOSCOPY;  Surgeon: Milus Banister, MD;  Location: [facility] ENDOSCOPY;  Service: Endoscopy;  Laterality: N/A;  . DILATION AND CURETTAGE OF UTERUS    . EP IMPLANTABLE DEVICE N/A 10/06/2015   Procedure: BIV ICD Generator Changeout;  Surgeon: [name], MD;  Location: [place] CV LAB;  Service:  Cardiovascular;  Laterality: N/A;  . ESOPHAGOGASTRODUODENOSCOPY N/A 10/26/2017   Procedure: ESOPHAGOGASTRODUODENOSCOPY (EGD);  Surgeon: Ladene Artist, MD;  Location: [facility] ENDOSCOPY;  Service: Endoscopy;  Laterality: N/A;  . ESOPHAGOGASTRODUODENOSCOPY (EGD) WITH PROPOFOL N/A 11/07/2017   Procedure: ESOPHAGOGASTRODUODENOSCOPY (EGD) WITH PROPOFOL;  Surgeon: Milus Banister, MD;  Location: [facility] ENDOSCOPY;  Service: Endoscopy;  Laterality: N/A;  . HOT HEMOSTASIS N/A 10/26/2017   Procedure: HOT HEMOSTASIS (ARGON PLASMA COAGULATION/BICAP);  Surgeon: Ladene Artist, MD;  Location: [facility] ENDOSCOPY;  Service: Endoscopy;  Laterality: N/A;  . INSERT / REPLACE / REMOVE PACEMAKER  ?2008  . POLYPECTOMY  11/08/2017   Procedure: POLYPECTOMY;  Surgeon: Milus Banister, MD;  Location: [facility] ENDOSCOPY;  Service: Endoscopy;;  . RIGHT HEART CATH N/A 02/13/2018   Procedure: RIGHT HEART CATH;  Surgeon: [name], MD;  Location: [place] CV LAB;  Service: Cardiovascular;  Laterality: N/A;  . TUBAL LIGATION      Prior to Admission medications   Medication Sig Start Date End Date Taking? Authorizing Provider  allopurinol (ZYLOPRIM) 100 MG tablet Take 1/2 tablet by mouth daily Patient taking differently: Take 100 mg by mouth daily.  01/29/18  Yes [name], DO  amiodarone (PACERONE) 200 MG tablet Take 200 mg by mouth at bedtime.    Yes [provider]  BIDIL 20-37.5 MG tablet TAKE 1 TABLET BY MOUTH TWICE DAILY Patient taking differently: Take 1 tablet by mouth 2 (two) times daily.  04/26/17  Yes [name], MD  carvedilol (COREG) 25 MG tablet Take 25 mg by mouth 2 (two) times daily. 12/17/17  Yes [provider]  colchicine 0.6 MG tablet Take 0.6 mg by mouth daily as needed (gout flares).  02/06/18  Yes [provider]  cyanocobalamin (,VITAMIN B-12,) 1000 MCG/ML injection Inject 1,000 mcg into the muscle every 30 (thirty) days.   Yes [provider]  levothyroxine (SYNTHROID,  LEVOTHROID) 125 MCG tablet Take 125 mcg by mouth daily before breakfast.   Yes [provider]  pantoprazole (PROTONIX) 40 MG tablet Take 1 tablet (40 mg total) by mouth daily. 01/05/18  Yes Emokpae, Courage, MD  polyethylene glycol (MIRALAX / GLYCOLAX) packet Take 17 g by mouth 2 (two) times daily. Patient taking differently: Take 17 g by mouth daily as needed for mild constipation.  01/05/18  Yes Emokpae, Courage, MD  potassium chloride 20 MEQ TBCR Take 20 mEq by mouth daily. 02/08/18  Yes [name], MD  sertraline (ZOLOFT) 50 MG tablet Take 1.5 tablets (75 mg total) by mouth daily. 01/29/18  Yes [name], DO  torsemide (DEMADEX) 20 MG tablet Take 1 tablet (20 mg total) by mouth daily. 03/02/18  Yes Clegg, [name] D, NP  albuterol (PROVENTIL HFA;VENTOLIN HFA) 108 (90 Base) MCG/ACT inhaler Inhale 2 puffs into the lungs every 6 (six) hours as needed for wheezing or shortness of breath. 01/05/18   Roxan Hockey, MD  dicyclomine (BENTYL) 10 MG capsule Take 1 capsule (10 mg total) by mouth 3 (three) times  daily before meals. 02/26/18   Zehr, Laban Emperor, PA-C  nitroGLYCERIN (NITROSTAT) 0.4 MG SL tablet Place 0.4 mg under the tongue every 5 (five) minutes as needed for chest pain.    [provider]    Current Facility-Administered Medications  Medication Dose Route Frequency Provider Last Rate Last Dose  . 0.9 %  sodium chloride infusion   Intravenous PRN Lolita Rieger, RN 10 mL/hr at 03/05/18 0453    . albuterol (PROVENTIL) (2.5 MG/3ML) 0.083% nebulizer solution 2.5 mg  2.5 mg Nebulization Q6H PRN Shelly Coss, MD      . alum & mag hydroxide-simeth (MAALOX/MYLANTA) 200-200-20 MG/5ML suspension 30 mL  30 mL Oral Q6H PRN Michael Boston, MD      . amiodarone (PACERONE) tablet 200 mg  200 mg Oral QHS Shelly Coss, MD   200 mg at 03/04/18 2221  . carvedilol (COREG) tablet 25 mg  25 mg Oral BID Shelly Coss, MD   25 mg at 03/04/18 2221  . ceFEPIme (MAXIPIME) 1 g in sodium  chloride 0.9 % 100 mL IVPB  1 g Intravenous Q24H Thomes Lolling, [facility]   Stopped at 03/04/18 2301  . furosemide (LASIX) injection 80 mg  80 mg Intravenous Ronney Asters, MD      . guaiFENesin-dextromethorphan (ROBITUSSIN DM) 100-10 MG/5ML syrup 10 mL  10 mL Oral Q4H PRN Michael Boston, MD      . heparin injection 5,000 Units  5,000 Units Subcutaneous Q8H Shelly Coss, MD   5,000 Units at 03/05/18 0602  . hydrALAZINE (APRESOLINE) injection 10 mg  10 mg Intravenous Q4H PRN Shelly Coss, MD   10 mg at 02/27/18 1835  . hydrocortisone (ANUSOL-HC) 2.5 % rectal cream 1 application  1 application Topical QID PRN Michael Boston, MD      . hydrocortisone cream 1 % 1 application  1 application Topical TID PRN Michael Boston, MD      . HYDROmorphone (DILAUDID) injection 0.5 mg  0.5 mg Intravenous Q4H PRN Irene Pap N, DO   0.5 mg at 03/04/18 0804  . insulin aspart (novoLOG) injection 0-9 Units  0-9 Units Subcutaneous TID WC Shelly Coss, MD   1 Units at 03/04/18 0804  . iohexol (OMNIPAQUE) 300 MG/ML solution 30 mL  30 mL Oral Once PRN Meuth, Brooke A, PA-C   30 mL at 03/04/18 1133  . isosorbide-hydrALAZINE (BIDIL) 20-37.5 MG per tablet 1 tablet  1 tablet Oral BID Shelly Coss, MD   1 tablet at 03/04/18 2220  . lactated ringers bolus 1,000 mL  1,000 mL Intravenous TID PRN Michael Boston, MD      . levothyroxine (SYNTHROID, LEVOTHROID) tablet 125 mcg  125 mcg Oral QAC breakfast Shelly Coss, MD   125 mcg at 03/05/18 0601  . lip balm (CARMEX) ointment 1 application  1 application Topical BID Michael Boston, MD   1 application at 89/38/10 2217  . magic mouthwash  15 mL Oral QID PRN Michael Boston, MD      . menthol-cetylpyridinium (CEPACOL) lozenge 3 mg  1 lozenge Oral PRN Michael Boston, MD      . methocarbamol (ROBAXIN) 1,000 mg in dextrose 5 % 50 mL IVPB  1,000 mg Intravenous Q6H PRN Michael Boston, MD   Stopped at 03/03/18 0052  . metroNIDAZOLE (FLAGYL) IVPB 500 mg  500 mg Intravenous Q8H Hall,  Carole N, DO 100 mL/hr at 03/05/18 0453 500 mg at 03/05/18 0453  . nitroGLYCERIN (NITROSTAT) SL tablet 0.4 mg  0.4 mg Sublingual Q5  min PRN Shelly Coss, MD      . ondansetron [facility]) injection 4 mg  4 mg Intravenous Q6H PRN Shelly Coss, MD   4 mg at 03/01/18 1438  . oxyCODONE (Oxy IR/ROXICODONE) immediate release tablet 5 mg  5 mg Oral Q6H PRN Irene Pap N, DO      . pantoprazole (PROTONIX) EC tablet 40 mg  40 mg Oral Daily Shelly Coss, MD   40 mg at 03/04/18 0956  . phenol (CHLORASEPTIC) mouth spray 1-2 spray  1-2 spray Mouth/Throat PRN Michael Boston, MD      . polyethylene glycol (MIRALAX / GLYCOLAX) packet 17 g  17 g Oral Daily Michael Boston, MD      . prochlorperazine (COMPAZINE) injection 5-10 mg  5-10 mg Intravenous Q4H PRN Michael Boston, MD   10 mg at 03/04/18 1329  . saccharomyces boulardii (FLORASTOR) capsule 250 mg  250 mg Oral BID Michael Boston, MD   250 mg at 03/04/18 2221  . sertraline (ZOLOFT) tablet 75 mg  75 mg Oral Daily Shelly Coss, MD   75 mg at 03/04/18 0956  . sodium bicarbonate 150 mEq in sterile water 1,000 mL infusion   Intravenous Continuous Kayleen Memos, DO 75 mL/hr at 03/05/18 0327      Allergies as of 02/27/2018  . (No Known Allergies)    Family History  Problem Relation Age of Onset  . Asthma Father   . Heart attack Father   . Asthma Sister   . Lung cancer Sister   . Heart attack Mother   . Stroke Brother   . Colon cancer Neg Hx   . Esophageal cancer Neg Hx   . Pancreatic cancer Neg Hx   . Stomach cancer Neg Hx   . Liver disease Neg Hx     Social History   Socioeconomic History  . Marital status: Divorced    Spouse name: Not on file  . Number of children: 1  . Years of education: 69  . Highest education level: Not on file  Occupational History  . Not on file  Social Needs  . Financial resource strain: Not hard at all  . Food insecurity:    Worry: Never true    Inability: Sometimes true  . Transportation needs:     Medical: No    Non-medical: No  Tobacco Use  . Smoking status: Never Smoker  . Smokeless tobacco: Never Used  Substance and Sexual Activity  . Alcohol use: Yes    Comment: 01/03/2018 "couple drinks/month"  . Drug use: No  . Sexual activity: Not Currently  Lifestyle  . Physical activity:    Days per week: Not on file    Minutes per session: Not on file  . Stress: Not on file  Relationships  . Social connections:    Talks on phone: Not on file    Gets together: Not on file    Attends religious service: Not on file    Active member of club or organization: Not on file    Attends meetings of clubs or organizations: Not on file    Relationship status: Not on file  . Intimate partner violence:    Fear of current or ex partner: Not on file    Emotionally abused: Not on file    Physically abused: Not on file    Forced sexual activity: Not on file  Other Topics Concern  . Not on file  Social History Narrative   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      Home Situation: lives alone      Spiritual Beliefs: Christian - Baptist      Lifestyle: no regular exercise; poor diet      She is divorced at least one adult child   Never smoker    rare alcohol to occasional at best    no substance abuse          Review of Systems: Gen: Denies any fever, chills, no sweats, anorexia positive for fatigue and weakness, no malaise, no weight loss, and sleep disorder HEENT: No visual complaints, No history of Retinopathy. Normal external appearance No Epistaxis or Sore throat. No sinusitis.   CV: Denies chest pain, angina, palpitations, syncope, orthopnea, PND, peripheral edema, and claudication. Resp: History of asthma, she does have some shortness of breath on completing long sentences she does not appear dyspneic at rest in bed GI: Per HPI.  Patient receiving IV fluids 75 cc an hour GU : Denies urinary burning, blood in urine, urinary frequency, urinary hesitancy, nocturnal urination,  and urinary incontinence.  No renal calculi. MS: Denies joint pain, limitation of movement, and swelling, stiffness, low back pain, extremity pain. Denies muscle weakness, cramps, atrophy.  No use of non steroidal antiinflammatory drugs. Derm: Denies rash, itching, dry skin, hives, moles, warts, or unhealing ulcers.  Psych: Denies depression, anxiety, memory loss, suicidal ideation, hallucinations, paranoia, and confusion. Heme: Denies bruising, bleeding, and enlarged lymph nodes. Neuro: No headache.  No diplopia. No dysarthria.  No dysphasia.  No history of CVA.  No Seizures. No paresthesias.  No weakness. Endocrine No DM.  No Thyroid disease.  No Adrenal disease.  Physical Exam: Vital signs in last 24 hours: Temp:  [98.3 F (36.8 C)-98.6 F (37 C)] 98.3 F (36.8 C) (10/29 0455) Pulse Rate:  [62-65] 62 (10/29 0455) Resp:  [19-20] 20 (10/29 0455) BP: (130-165)/(62-74) 130/62 (10/29 0455) SpO2:  [96 %-98 %] 96 % (10/29 0455) Weight:  [68.2 kg] 68.2 kg (10/29 0450) Last BM Date: 03/04/18 General:   Alert,  Well-developed, well-nourished, pleasant and cooperative in NAD Head:  Normocephalic and atraumatic. Eyes:  Sclera clear, no icterus.   Conjunctiva pink. Ears:  Normal auditory acuity. Nose:  No deformity, discharge,  or lesions. Mouth:  No deformity or lesions, dentition normal. Neck:  Supple; no masses or thyromegaly.  Elevated JVP 6 to 8 cm Lungs: Decreased breath sounds with some crackles heard left worse than right Heart:  Regular rate and rhythm; no murmurs, clicks, rubs,  or gallops. Abdomen:  Soft, nontender and nondistended. No masses, hepatosplenomegaly or hernias noted. Normal bowel sounds, without guarding, and without rebound.   Msk:  Symmetrical without gross deformities. Normal posture. Pulses:  No carotid, renal, femoral bruits. DP and PT symmetrical and equal Extremities:  Without clubbing or edema. Neurologic:  Alert and  oriented x4;  grossly normal  neurologically. Skin:  Intact without significant lesions or rashes. Cervical Nodes:  No significant cervical adenopathy. Psych:  Alert and cooperative. Normal mood and affect.  Intake/Output from previous day: 10/28 0701 - 10/29 0700 In: 2040.8 [P.O.:222; I.V.:1468.8; IV Piggyback:350] Out: -  Intake/Output this shift: No intake/output data recorded.  Lab Results: Recent Labs    03/04/18 0557 03/05/18 0555  WBC 7.6 9.3  HGB 12.3 11.6*  HCT 40.4 36.6  PLT 264 245   BMET Recent Labs    03/03/18 0741 03/04/18 0557 03/05/18 0555  NA 136 132* 130*  K 3.3* 3.7 4.1  CL 103 103  100  CO2 19* 15* 16*  GLUCOSE 92 116* 78  BUN 33* 31* 31*  CREATININE 3.16* 3.64* 3.52*  CALCIUM 8.8* 8.6* 8.3*   LFT Recent Labs    03/05/18 0555  PROT 7.1  ALBUMIN 3.0*  AST 29  ALT 23  ALKPHOS 70  BILITOT 1.1   PT/INR No results for input(s): LABPROT, INR in the last 72 hours. Hepatitis Panel No results for input(s): HEPBSAG, HCVAB, HEPAIGM, HEPBIGM in the last 72 hours.  Studies/Results: Ct Abdomen Pelvis Wo Contrast  Result Date: 03/04/2018 CLINICAL DATA:  72 year old female with left lower quadrant pain for 3 days and diarrhea. History of diverticulitis. Subsequent encounter. EXAM: CT ABDOMEN AND PELVIS WITHOUT CONTRAST TECHNIQUE: Multidetector CT imaging of the abdomen and pelvis was performed following the standard protocol without IV contrast. COMPARISON:  02/27/2018. FINDINGS: Lower chest: Interval worsening of bibasilar parenchymal changes and development of small pleural effusions greater on the right. This raises possibility of mild pulmonary edema. Cardiomegaly with biventricular pacer/AICD in place. Dense breast parenchyma. Hepatobiliary: Cirrhotic liver. Taking into account limitation by non contrast imaging, no focal mass identified. No calcified gallstones. Possible sludge/noncalcified gallstones. Pancreas: Taking into account limitation by non contrast imaging, no worrisome  pancreatic mass or inflammation. Spleen: Taking into account limitation by non contrast imaging, no splenic mass or enlargement. Adrenals/Urinary Tract: No obstructing stone or hydronephrosis. Taking into account limitation by non contrast imaging, no worrisome renal, adrenal or urinary bladder lesion. Stomach/Bowel: Inflammatory process/abscess which spans between the sigmoid colon and small bowel appears minimally more prominent compared to the 02/27/2018 CT, currently measuring 3.6 x 3 x 3.1 cm versus prior 3.2 x 2.9 x 2.9 cm. This is most likely related to sigmoid diverticulitis with secondary inflammation of small bowel loop. As small bowel loops surrounding this abscess are not completely fill with contrast, it is difficult to adequately assess small bowel. Mild wall thickening of jejunum proximal to the abscess may represent changes of enteritis and new from the prior exam. Primary enteritis as cause for abscesses felt to be less likely consideration. Vascular/Lymphatic: Atherosclerotic changes aorta and aortic branch vessels without abdominal aortic aneurysm. Scattered normal to top-normal size lymph nodes may be reactive in origin. Reproductive: Calcified uterine fibroid impresses upon the dome of the bladder. No worrisome adnexal mass. Other: No free air seen separate from abscess collection. Minimal third spacing of fluid. Musculoskeletal: No acute osseous abnormality. IMPRESSION: 12 1. Inflammatory process/abscess which spans between the sigmoid colon and small bowel appears minimally more prominent compared to the 02/27/2018 CT, currently measuring 3.6 x 3 x 3.1 cm versus prior 3.2 x 2.9 x 2.9 cm. This is most likely related to sigmoid diverticulitis with secondary inflammation of small bowel loop. As small bowel loops surrounding this abscess are not completely fill with contrast, it is difficult to adequately assess small bowel in this region. 2. Mild wall thickening of jejunum proximal to the abscess  may represent changes of enteritis and new from the prior exam. Primary enteritis as cause for abscess is felt to be a less likely consideration (based on appearance from 02/27/2018 exam). 3. Interval worsening of bibasilar parenchymal changes and development of small pleural effusions greater on the right. This raises possibility of mild pulmonary edema. 4. Cardiomegaly with biventricular pacer/AICD in place. 5. Cirrhosis. 6.  Aortic Atherosclerosis (ICD10-I70.0). 7. Minimal third spacing of fluid. Electronically Signed   By: Genia Del M.D.   On: 03/04/2018 17:10   US Renal  Result Date: 03/05/2018  CLINICAL DATA:  Acute kidney injury EXAM: RENAL / URINARY TRACT ULTRASOUND COMPLETE COMPARISON:  03/04/2018 FINDINGS: Right Kidney: Length: 10.0 cm. Increased echotexture diffusely. No mass or hydronephrosis. Left Kidney: Length: 10.0 cm. Increased echotexture diffusely. No mass or hydronephrosis. Bladder: Appears normal for degree of bladder distention. Small right effusion. IMPRESSION: Increased echotexture in the kidneys bilaterally compatible with chronic medical renal disease. No hydronephrosis. Small right effusion. Electronically Signed   By: Rolm Baptise M.D.   On: 03/05/2018 10:07    Assessment/Plan:  Acute kidney injury.  Is on the baseline of chronic kidney disease stage IV secondary to hypertension possibly diabetes.  Renal ultrasound did not reveal any evidence of hydronephrosis.  Urine sodium less than 10 urinalysis was bland.  She does have diastolic dysfunction she does not appear unduly dyspneic however does have an increase in her JVP.  I will order a two-view chest x-ray.  I will discontinue her IV fluids at this time if her ask 80 mg IV every 12 hours.  To assess her volume status in the morning.  That does not seem to be any hemodynamic dysfunction.  Her acute on chronic renal failure may be the setting of infection or hypovolemia.  She is positive fluid balance this time that appears  to be about 5 L this could indicate interstitial nephritis from her Zosyn and this is been discontinued and switched to Flagyl and cefepime there does not appear to be any evidence of interstitial disease though on urinalysis inspection.  Metabolic acidosis will replete with oral sodium bicarbonate.  Diverticular abscess agree with drainage antibiotic therapy is appropriate  Anemia this does not appear to be an issue at this time  Hepatitis C treated with Harvoni therapy  Diabetes controlled primary service  Volume.  This is particularly difficult to assess she does have an increase in her neck vein distention I will check a chest x-ray and diuresis today as she is symptomatic with some dyspnea on completing sentences   LOS: [place] @TODAY @11 :55 AM

## 2018-03-05 NOTE — Evaluation (Signed)
Physical Therapy Evaluation Patient Details Name: Morgan Clay MRN: [id number] DOB: 1945/12/05 Today's Date: 03/05/2018   History of Present Illness  72 yo female admitted by PCP on 10/23 for diverticulitis of sigmoid colon with abscess. Pt admitted earlier this year for similar diagnosis. PMH includes afib, AICD, anemia, OA, chronic bronchitis, CHF, depression, HTN, LBBB, lower CI bleed, obesity, OSA, osteopenia, diarrhea, ARF, COPD, atrial flutter, cardiomyopathy.   Clinical Impression   Pt presents with generalized LE weakness, fatigue with ambulation, and decreased dynamic balance as evidenced by performance on DGI (scored in fall risk category). Pt to benefit from acute PT to address deficits. Pt ambulated 250 ft with min guard assist. Pt with fatigue with mobility this session. Pt with RR ranging from 20-37 this session, but when asked, pt stated she didn't feel short of breath or labored in breathing. HR WNL during session. PT to progress mobility as tolerated, and will continue to follow acutely.      Follow Up Recommendations Home health PT    Equipment Recommendations  Rolling walker with 5" wheels    Recommendations for Other Services       Precautions / Restrictions Precautions Precautions: Fall Restrictions Weight Bearing Restrictions: No      Mobility  Bed Mobility Overal bed mobility: Needs Assistance Bed Mobility: Supine to Sit     Supine to sit: Min guard;HOB elevated     General bed mobility comments: Min guard for safety. Verbal cuing for scooting to EOB.   Transfers Overall transfer level: Needs assistance Equipment used: None Transfers: Sit to/from Stand Sit to Stand: Min guard;From elevated surface         General transfer comment: Min guard for safety. After standing, pt reaching for IV pole for steadying.   Ambulation/Gait Ambulation/Gait assistance: Min guard Gait Distance (Feet): 250 Feet Assistive device: IV Pole;None Gait  Pattern/deviations: Step-through pattern;Decreased stride length;Wide base of support Gait velocity: decr    General Gait Details: Pt with use of IV pole for steadying for first 10 ft, PT encouraged pt to ambulate without so PT could assess pt dynamic balance. Pt with slower gait speed and wider BOS without use of IV pole, reaching for rails on walls for support.   Stairs            Wheelchair Mobility    Modified Rankin (Stroke Patients Only)       Balance Overall balance assessment: Needs assistance Sitting-balance support: No upper extremity supported Sitting balance-Leahy Scale: Good     Standing balance support: No upper extremity supported Standing balance-Leahy Scale: Fair                   Standardized Balance Assessment Standardized Balance Assessment : Dynamic Gait Index   Dynamic Gait Index Level Surface: Mild Impairment Change in Gait Speed: Mild Impairment Gait with Horizontal Head Turns: Mild Impairment Gait with Vertical Head Turns: Mild Impairment Gait and Pivot Turn: Mild Impairment Step Over Obstacle: Mild Impairment Step Around Obstacles: Normal Steps: Mild Impairment(not attempted, estimating based on pt's performance) Total Score: 17       Pertinent Vitals/Pain Pain Assessment: 0-10 Pain Score: 0-No pain Pain Intervention(s): Monitored during session    Home Living Family/patient expects to be discharged to:: Private residence Living Arrangements: Alone Available Help at Discharge: Friend(s);Available PRN/intermittently Type of Home: Apartment Home Access: Level entry     Home Layout: One level Home Equipment: None      Prior Function Level of Independence: Independent  Hand Dominance   Dominant Hand: Right    Extremity/Trunk Assessment   Upper Extremity Assessment Upper Extremity Assessment: Overall WFL for tasks assessed    Lower Extremity Assessment Lower Extremity Assessment: Generalized  weakness    Cervical / Trunk Assessment Cervical / Trunk Assessment: Normal  Communication   Communication: No difficulties  Cognition Arousal/Alertness: Awake/alert Behavior During Therapy: WFL for tasks assessed/performed Overall Cognitive Status: Within Functional Limits for tasks assessed                                        General Comments      Exercises     Assessment/Plan    PT Assessment Patient needs continued PT services  PT Problem List Decreased strength;Decreased activity tolerance;Decreased knowledge of use of DME;Decreased balance;Decreased safety awareness;Decreased mobility       PT Treatment Interventions DME instruction;Therapeutic activities;Gait training;Patient/family education;Balance training;Therapeutic exercise;Functional mobility training    PT Goals (Current goals can be found in the Care Plan section)  Acute Rehab PT Goals Patient Stated Goal: none stated  PT Goal Formulation: With patient Time For Goal Achievement: 03/19/18 Potential to Achieve Goals: Good    Frequency Min 3X/week   Barriers to discharge        Co-evaluation               AM-PAC PT "6 Clicks" Daily Activity  Outcome Measure Difficulty turning over in bed (including adjusting bedclothes, sheets and blankets)?: Unable Difficulty moving from lying on back to sitting on the side of the bed? : Unable Difficulty sitting down on and standing up from a chair with arms (e.g., wheelchair, bedside commode, etc,.)?: Unable Help needed moving to and from a bed to chair (including a wheelchair)?: None Help needed walking in hospital room?: A Little Help needed climbing 3-5 steps with a railing? : A Little 6 Click Score: 13    End of Session   Activity Tolerance: Patient limited by fatigue;Patient tolerated treatment well Patient left: in chair;with call bell/phone within reach(pt and RN state pt mobilizes in room without assist) Nurse Communication:  Mobility status PT Visit Diagnosis: Unsteadiness on feet (R26.81);Muscle weakness (generalized) (M62.81)    Time: 1250-1306 PT Time Calculation (min) (ACUTE ONLY): 16 min   Charges:   PT Evaluation $PT Eval Low Complexity: 1 Low         Raekwon Winkowski Conception Chancy, PT Acute Rehabilitation Services Pager [phone]  Office [phone]   Marlette Curvin D Dellie Piasecki 03/05/2018, 3:11 PM

## 2018-03-06 ENCOUNTER — Telehealth (HOSPITAL_COMMUNITY): Payer: Self-pay

## 2018-03-06 DIAGNOSIS — E038 Other specified hypothyroidism: Secondary | ICD-10-CM

## 2018-03-06 DIAGNOSIS — B370 Candidal stomatitis: Secondary | ICD-10-CM

## 2018-03-06 DIAGNOSIS — E1122 Type 2 diabetes mellitus with diabetic chronic kidney disease: Secondary | ICD-10-CM

## 2018-03-06 DIAGNOSIS — G4733 Obstructive sleep apnea (adult) (pediatric): Secondary | ICD-10-CM

## 2018-03-06 DIAGNOSIS — E876 Hypokalemia: Secondary | ICD-10-CM

## 2018-03-06 DIAGNOSIS — N184 Chronic kidney disease, stage 4 (severe): Secondary | ICD-10-CM

## 2018-03-06 LAB — BASIC METABOLIC PANEL
Anion gap: 15 (ref 5–15)
BUN: 30 mg/dL — AB (ref 8–23)
CO2: 20 mmol/L — ABNORMAL LOW (ref 22–32)
Calcium: 8.7 mg/dL — ABNORMAL LOW (ref 8.9–10.3)
Chloride: 101 mmol/L (ref 98–111)
Creatinine, Ser: 2.55 mg/dL — ABNORMAL HIGH (ref 0.44–1.00)
GFR calc Af Amer: 21 mL/min — ABNORMAL LOW (ref >60)
GFR, EST NON AFRICAN AMERICAN: 18 mL/min — AB (ref >60)
GLUCOSE: 75 mg/dL (ref 70–99)
POTASSIUM: 2.9 mmol/L — AB (ref 3.5–5.1)
Sodium: 136 mmol/L (ref 135–145)

## 2018-03-06 LAB — HEPATITIS PANEL, ACUTE
HEP A IGM: NEGATIVE
HEP B C IGM: NEGATIVE
HEP B S AG: NEGATIVE

## 2018-03-06 LAB — CALCIUM / CREATININE RATIO, URINE
Calcium, Ur: <0.8 mg/dL
Calcium/Creat.Ratio: <12 mg/g creat (ref 0–260)
Creatinine, Urine: 65.2 mg/dL

## 2018-03-06 LAB — MAGNESIUM: Magnesium: 1.7 mg/dL (ref 1.7–2.4)

## 2018-03-06 MED ORDER — SODIUM BICARBONATE 650 MG PO TABS: 650.0000 mg | ORAL_TABLET | Freq: Two times a day (BID) | ORAL | 0 refills | Status: DC

## 2018-03-06 MED ORDER — POLYETHYLENE GLYCOL 3350 17 G PO PACK: 17.0000 g | PACK | Freq: Every day | ORAL | Status: DC | PRN

## 2018-03-06 MED ORDER — POLYETHYLENE GLYCOL 3350 17 G PO PACK: 17.0000 g | PACK | Freq: Every day | ORAL | 0 refills | Status: DC | PRN

## 2018-03-06 MED ORDER — METRONIDAZOLE 500 MG PO TABS: 500.0000 mg | ORAL_TABLET | Freq: Three times a day (TID) | ORAL | 0 refills | Status: AC

## 2018-03-06 MED ORDER — POTASSIUM CHLORIDE ER 20 MEQ PO TBCR: 20.0000 meq | EXTENDED_RELEASE_TABLET | Freq: Every day | ORAL | 0 refills | Status: DC

## 2018-03-06 MED ORDER — NYSTATIN 100000 UNIT/ML MT SUSP: 5.0000 mL | Freq: Four times a day (QID) | OROMUCOSAL | 0 refills | Status: DC

## 2018-03-06 MED ORDER — OXYCODONE HCL 5 MG PO TABS: 5.0000 mg | ORAL_TABLET | Freq: Four times a day (QID) | ORAL | 0 refills | Status: DC | PRN

## 2018-03-06 MED ORDER — POTASSIUM CHLORIDE CRYS ER 20 MEQ PO TBCR
40.0000 meq | EXTENDED_RELEASE_TABLET | ORAL | Status: AC
  Administered 2018-03-06 (×2): 40 meq via ORAL
  Filled 2018-03-06 (×2): qty 2

## 2018-03-06 MED ORDER — HYDROCORTISONE 2.5 % RE CREA: 1.0000 "application " | TOPICAL_CREAM | Freq: Four times a day (QID) | RECTAL | 0 refills | Status: DC | PRN

## 2018-03-06 MED ORDER — MAGNESIUM SULFATE 2 GM/50ML IV SOLN
2.0000 g | Freq: Once | INTRAVENOUS | Status: AC
  Administered 2018-03-06: 2 g via INTRAVENOUS
  Filled 2018-03-06: qty 50

## 2018-03-06 MED ORDER — ALUM & MAG HYDROXIDE-SIMETH 200-200-20 MG/5ML PO SUSP: 30.0000 mL | Freq: Four times a day (QID) | ORAL | 0 refills | Status: DC | PRN

## 2018-03-06 MED ORDER — SACCHAROMYCES BOULARDII 250 MG PO CAPS: 250.0000 mg | ORAL_CAPSULE | Freq: Two times a day (BID) | ORAL | 0 refills | Status: DC

## 2018-03-06 MED ORDER — NYSTATIN 100000 UNIT/ML MT SUSP
5.0000 mL | Freq: Four times a day (QID) | OROMUCOSAL | Status: DC
  Administered 2018-03-06 (×2): 500000 [IU] via ORAL
  Filled 2018-03-06 (×2): qty 5

## 2018-03-06 MED ORDER — BISACODYL 10 MG RE SUPP: 10.0000 mg | RECTAL | 0 refills | Status: DC | PRN

## 2018-03-06 MED ORDER — MENTHOL 3 MG MT LOZG: 1.0000 | LOZENGE | OROMUCOSAL | 12 refills | Status: DC | PRN

## 2018-03-06 MED ORDER — CIPROFLOXACIN HCL 500 MG PO TABS: 500.0000 mg | ORAL_TABLET | Freq: Two times a day (BID) | ORAL | 0 refills | Status: AC

## 2018-03-06 NOTE — Progress Notes (Signed)
[facility] Progress Note     Subjective: CC:  LLQ pain is mild and intermittent, improving. Denies vomiting or nausea. Having BMs. Tolerating PO. Mobilizing independently.   I spoke with the patient and her sister again about plan of care and answered their questions at bedside.  Objective: Vital signs in last 24 hours: Temp:  [97.7 F (36.5 C)-98.6 F (37 C)] 98.5 F (36.9 C) (10/30 0511) Pulse Rate:  [67-77] 67 (10/30 0511) Resp:  [14-19] 17 (10/30 0511) BP: (133-190)/(58-73) 133/58 (10/30 0511) SpO2:  [92 %-98 %] 92 % (10/30 0511) Weight:  [63.5 kg] 63.5 kg (10/30 0553) Last BM Date: 03/05/18  Intake/Output from previous day: 10/29 0701 - 10/30 0700 In: 953.1 [P.O.:60; I.V.:539.9; IV Piggyback:353.2] Out: 1800 [Urine:1800] Intake/Output this shift: No intake/output data recorded.  PE: Gen: Alert, NAD, pleasant HEENT: anicteric sclerae, pupils equal and round Pulm: CTAB, no W/R/R, effort normal Abd: Soft,ND, mild TTP LLQ without rebound or guarding, +BS, no palpable masses or organomegaly Psych: A&Ox3  Skin: no rashes noted, warm and dry   Lab Results:  Recent Labs    03/04/18 0557 03/05/18 0555  WBC 7.6 9.3  HGB 12.3 11.6*  HCT 40.4 36.6  PLT 264 245   BMET Recent Labs    03/04/18 0557 03/05/18 0555  NA 132* 130*  K 3.7 4.1  CL 103 100  CO2 15* 16*  GLUCOSE 116* 78  BUN 31* 31*  CREATININE 3.64* 3.52*  CALCIUM 8.6* 8.3*   PT/INR No results for input(s): LABPROT, INR in the last 72 hours. CMP     Component Value Date/Time   NA 130 (L) 03/05/2018 0555   NA 138 12/31/2017 0846   K 4.1 03/05/2018 0555   CL 100 03/05/2018 0555   CO2 16 (L) 03/05/2018 0555   GLUCOSE 78 03/05/2018 0555   BUN 31 (H) 03/05/2018 0555   BUN 99 (HH) 12/31/2017 0846   CREATININE 3.52 (H) 03/05/2018 0555   CREATININE 2.36 (H) 11/23/2017 1132   CREATININE 1.26 (H) 02/25/2016 1607   CALCIUM 8.3 (L) 03/05/2018 0555   PROT 7.1 03/05/2018 0555   PROT  8.3 07/19/2017 1503   ALBUMIN 3.0 (L) 03/05/2018 0555   ALBUMIN 4.9 (H) 07/19/2017 1503   AST 29 03/05/2018 0555   AST 14 (L) 11/23/2017 1132   ALT 23 03/05/2018 0555   ALT 9 11/23/2017 1132   ALKPHOS 70 03/05/2018 0555   BILITOT 1.1 03/05/2018 0555   BILITOT 0.3 11/23/2017 1132   GFRNONAA 12 (L) 03/05/2018 0555   GFRNONAA 20 (L) 11/23/2017 1132   GFRAA 14 (L) 03/05/2018 0555   GFRAA 23 (L) 11/23/2017 1132   Lipase     Component Value Date/Time   LIPASE 39 02/27/2018 1730       Studies/Results: Ct Abdomen Pelvis Wo Contrast  Result Date: 03/04/2018 CLINICAL DATA:  72 year old female with left lower quadrant pain for 3 days and diarrhea. History of diverticulitis. Subsequent encounter. EXAM: CT ABDOMEN AND PELVIS WITHOUT CONTRAST TECHNIQUE: Multidetector CT imaging of the abdomen and pelvis was performed following the standard protocol without IV contrast. COMPARISON:  02/27/2018. FINDINGS: Lower chest: Interval worsening of bibasilar parenchymal changes and development of small pleural effusions greater on the right. This raises possibility of mild pulmonary edema. Cardiomegaly with biventricular pacer/AICD in place. Dense breast parenchyma. Hepatobiliary: Cirrhotic liver. Taking into account limitation by non contrast imaging, no focal mass identified. No calcified gallstones. Possible sludge/noncalcified gallstones. Pancreas: Taking into account limitation by non contrast  imaging, no worrisome pancreatic mass or inflammation. Spleen: Taking into account limitation by non contrast imaging, no splenic mass or enlargement. Adrenals/Urinary Tract: No obstructing stone or hydronephrosis. Taking into account limitation by non contrast imaging, no worrisome renal, adrenal or urinary bladder lesion. Stomach/Bowel: Inflammatory process/abscess which spans between the sigmoid colon and small bowel appears minimally more prominent compared to the 02/27/2018 CT, currently measuring 3.6 x 3 x 3.1 cm  versus prior 3.2 x 2.9 x 2.9 cm. This is most likely related to sigmoid diverticulitis with secondary inflammation of small bowel loop. As small bowel loops surrounding this abscess are not completely fill with contrast, it is difficult to adequately assess small bowel. Mild wall thickening of jejunum proximal to the abscess may represent changes of enteritis and new from the prior exam. Primary enteritis as cause for abscesses felt to be less likely consideration. Vascular/Lymphatic: Atherosclerotic changes aorta and aortic branch vessels without abdominal aortic aneurysm. Scattered normal to top-normal size lymph nodes may be reactive in origin. Reproductive: Calcified uterine fibroid impresses upon the dome of the bladder. No worrisome adnexal mass. Other: No free air seen separate from abscess collection. Minimal third spacing of fluid. Musculoskeletal: No acute osseous abnormality. IMPRESSION: 12 1. Inflammatory process/abscess which spans between the sigmoid colon and small bowel appears minimally more prominent compared to the 02/27/2018 CT, currently measuring 3.6 x 3 x 3.1 cm versus prior 3.2 x 2.9 x 2.9 cm. This is most likely related to sigmoid diverticulitis with secondary inflammation of small bowel loop. As small bowel loops surrounding this abscess are not completely fill with contrast, it is difficult to adequately assess small bowel in this region. 2. Mild wall thickening of jejunum proximal to the abscess may represent changes of enteritis and new from the prior exam. Primary enteritis as cause for abscess is felt to be a less likely consideration (based on appearance from 02/27/2018 exam). 3. Interval worsening of bibasilar parenchymal changes and development of small pleural effusions greater on the right. This raises possibility of mild pulmonary edema. 4. Cardiomegaly with biventricular pacer/AICD in place. 5. Cirrhosis. 6.  Aortic Atherosclerosis (ICD10-I70.0). 7. Minimal third spacing of  fluid. Electronically Signed   By: Genia Del M.D.   On: 03/04/2018 17:10   Dg Chest 2 View  Result Date: 03/05/2018 CLINICAL DATA:  Two days of palpitations. History of asthma-COPD, permanent pacemaker defibrillator, chronic CHF. Nonsmoker. EXAM: CHEST - 2 VIEW COMPARISON:  PA and lateral chest x-ray of Oct 05, 2017 FINDINGS: The lungs are slightly less well inflated. There is new increased density in the right infrahilar region likely in the lower lobe. The cardiac silhouette remains enlarged. The pulmonary vascularity is mildly engorged and slightly more conspicuous overall today. The ICD is in stable position. There is calcification in the wall of the aortic arch. The bony thorax exhibits no acute abnormality. IMPRESSION: Chronic bronchitic changes, superimposed pneumonia in the right lower lobe. Low-grade CHF. Followup PA and lateral chest X-ray is recommended in 3-4 weeks following trial of antibiotic therapy to ensure resolution and exclude underlying malignancy. Thoracic aortic atherosclerosis. Electronically Signed   By: David  Martinique M.D.   On: 03/05/2018 15:06   US Renal  Result Date: 03/05/2018 CLINICAL DATA:  Acute kidney injury EXAM: RENAL / URINARY TRACT ULTRASOUND COMPLETE COMPARISON:  03/04/2018 FINDINGS: Right Kidney: Length: 10.0 cm. Increased echotexture diffusely. No mass or hydronephrosis. Left Kidney: Length: 10.0 cm. Increased echotexture diffusely. No mass or hydronephrosis. Bladder: Appears normal for degree of bladder  distention. Small right effusion. IMPRESSION: Increased echotexture in the kidneys bilaterally compatible with chronic medical renal disease. No hydronephrosis. Small right effusion. Electronically Signed   By: Rolm Baptise M.D.   On: 03/05/2018 10:07    Anti-infectives: Anti-infectives (From admission, onward)   Start     Dose/Rate Route Frequency Ordered Stop   03/04/18 2200  ceFEPIme (MAXIPIME) 1 g in sodium chloride 0.9 % 100 mL IVPB     1 g 200 mL/hr  over 30 Minutes Intravenous Every 24 hours 03/04/18 1646     03/04/18 2000  metroNIDAZOLE (FLAGYL) IVPB 500 mg     500 mg 100 mL/hr over 60 Minutes Intravenous Every 8 hours 03/04/18 1535     03/02/18 1200  piperacillin-tazobactam (ZOSYN) IVPB 2.25 g  Status:  Discontinued     2.25 g 100 mL/hr over 30 Minutes Intravenous Every 6 hours 03/02/18 0834 03/04/18 1535   02/28/18 1200  piperacillin-tazobactam (ZOSYN) IVPB 3.375 g  Status:  Discontinued     3.375 g 12.5 mL/hr over 240 Minutes Intravenous Every 8 hours 02/28/18 1043 03/02/18 0834   02/27/18 2315  metroNIDAZOLE (FLAGYL) IVPB 500 mg  Status:  Discontinued     500 mg 100 mL/hr over 60 Minutes Intravenous Every 8 hours 02/27/18 1709 02/27/18 1748   02/27/18 1845  piperacillin-tazobactam (ZOSYN) IVPB 2.25 g  Status:  Discontinued     2.25 g 100 mL/hr over 30 Minutes Intravenous Every 8 hours 02/27/18 1805 02/28/18 1043   02/27/18 1715  cefTRIAXone (ROCEPHIN) 1 g in sodium chloride 0.9 % 100 mL IVPB  Status:  Discontinued     1 g 200 mL/hr over 30 Minutes Intravenous Every 24 hours 02/27/18 1709 02/27/18 1748   02/27/18 1615  cefTRIAXone (ROCEPHIN) 2 g in sodium chloride 0.9 % 100 mL IVPB     2 g 200 mL/hr over 30 Minutes Intravenous  Once 02/27/18 1600 02/27/18 1653   02/27/18 1615  metroNIDAZOLE (FLAGYL) IVPB 500 mg  Status:  Discontinued     500 mg 100 mL/hr over 60 Minutes Intravenous  Once 02/27/18 1600 02/27/18 1748     Assessment/Plan Ischemic cardiomyopathy Biventricular IVD Hx of PAF HTN Pulmonary Hypertension Hx of AF -not onanticoaguation AKI onCKD-III - Cr up to 3.64 today Hx of GI bleed/gastric ulcer Gout Depression Hypothyroidism   Recurrent diverticulitis with small abscess - CT 10/23 showed inflammatory process involving the mid sigmoid colon and adjacent small bowel  with small abscess in the adjacent SB mesentery. - CT repeated 10/28 due to persistent pain shows persistent inflammatory changes and  slight increase in prominence of abscess (3.6x3x3 cm compared to 3.2x2.9x2.9 cm). No IR window for drainage. - WBC WNL, afebrile. Patient is overall clinically improving. - continue medical management. Transition to PO abx (augmentin) to complete an additional 10-14 days of treatment.  - Follow up with surgery in the outpatient setting as below to discuss elective partial colectomy. Patient should also receive a colonoscopy in about 8 weeks.  - stable for D/C from [facility] perspective.  FEN:IVF, soft diet ID: [id number] 10/23>> day7  DVT: SCDs, sq heparin Follow-up: Repeat CT scan 11/8 (our office scheduling), Dr. Johney Maine 11/11    LOS: 7 days    Obie Dredge, [facility] Pager: [phone]

## 2018-03-06 NOTE — Progress Notes (Signed)
[facility] ROUNDING NOTE   Subjective:   Appears to be doing well this morning no complaints  Urine output 1700 cc weight 63.5 kg  Blood pressure 133/58 pulse 67 temperature 98.5 O2 sats 90% room air  Sodium 130 potassium 4.1 chloride 100 CO2 16 BUN 31 creatinine 3.52 calcium 8.3 albumin 3.0 AST 29 ALT 23 magnesium 1.8 WBC 9.3 hemoglobin 11.6 platelets 245  Chest x-ray chronic bronchitic changes.  Objective:  Vital signs in last 24 hours:  Temp:  [97.7 F (36.5 C)-98.6 F (37 C)] 98.5 F (36.9 C) (10/30 0511) Pulse Rate:  [67-77] 67 (10/30 0511) Resp:  [14-19] 17 (10/30 0511) BP: (133-190)/(58-73) 133/58 (10/30 0511) SpO2:  [92 %-98 %] 92 % (10/30 0511) Weight:  [63.5 kg] 63.5 kg (10/30 0553)  Weight change: -4.763 kg Filed Weights   03/05/18 0450 03/06/18 0553  Weight: 68.2 kg 63.5 kg    Intake/Output: I/O last 3 completed shifts: In: 1935.3 [P.O.:282; I.V.:1100; IV Piggyback:553.2] Out: 1800 [Urine:1800]   Intake/Output this shift:  Total I/O In: 240 [P.O.:240] Out: -   CVS- RRR no murmurs rubs gallops RS- CTA no wheezes or rales ABD- BS present soft non-distended bowel sounds audible no rebound or guarding EXT- no edema   Basic Metabolic Panel: Recent Labs  Lab 03/02/18 0527 03/03/18 0741 03/04/18 0557 03/05/18 0555 03/06/18 0754  NA 141 136 132* 130* 136  K 3.6 3.3* 3.7 4.1 2.9*  CL 107 103 103 100 101  CO2 18* 19* 15* 16* 20*  GLUCOSE 71 92 116* 78 75  BUN 31* 33* 31* 31* 30*  CREATININE 2.50* 3.16* 3.64* 3.52* 2.55*  CALCIUM 9.3 8.8* 8.6* 8.3* 8.7*  MG  --   --  1.8  --  1.7    Liver Function Tests: Recent Labs  Lab 02/27/18 1730 03/05/18 0555  AST 56* 29  ALT 30 23  ALKPHOS 80 70  BILITOT 1.8* 1.1  PROT 8.2* 7.1  ALBUMIN 3.7 3.0*   Recent Labs  Lab 02/27/18 1730  LIPASE 39   No results for input(s): AMMONIA in the last 168 hours.  CBC: Recent Labs  Lab 02/28/18 0549 03/01/18 0944 03/02/18 0527  03/04/18 0557 03/05/18 0555  WBC 7.1 8.5 8.4 7.6 9.3  NEUTROABS 5.3 6.5  --   --  7.4  HGB 12.7 12.6 13.0 12.3 11.6*  HCT 42.0 40.3 43.0 40.4 36.6  MCV 88.2 85.7 87.9 87.6 85.1  PLT 276 263 285 264 245    Cardiac Enzymes: No results for input(s): CKTOTAL, CKMB, CKMBINDEX, TROPONINI in the last 168 hours.  BNP: Invalid input(s): POCBNP  CBG: Recent Labs  Lab 03/04/18 1146 03/04/18 1548 03/04/18 2036 03/05/18 0731 03/05/18 1205  GLUCAP 91 87 78 85 74    Microbiology: Results for orders placed or performed during the hospital encounter of 07/31/17  C difficile quick scan w PCR reflex     Status: None   Collection Time: 08/06/17 10:16 AM  Result Value Ref Range Status   C Diff antigen NEGATIVE NEGATIVE Final   C Diff toxin NEGATIVE NEGATIVE Final   C Diff interpretation No C. difficile detected.  Final    Comment: Performed at [facility], [place] [address] [place], [place] [address]  MRSA PCR Screening     Status: None   Collection Time: 08/09/17  8:00 PM  Result Value Ref Range Status   MRSA by PCR NEGATIVE NEGATIVE Final    Comment:  The GeneXpert MRSA Assay (FDA approved for NASAL specimens only), is one component of a comprehensive MRSA colonization surveillance program. It is not intended to diagnose MRSA infection nor to guide or monitor treatment for MRSA infections. Performed at [facility], [place] [address], [place], [place] [address]     Coagulation Studies: No results for input(s): LABPROT, INR in the last 72 hours.  Urinalysis: Recent Labs    03/05/18 0902  COLORURINE YELLOW  LABSPEC 1.008  PHURINE 5.0  GLUCOSEU NEGATIVE  HGBUR NEGATIVE  BILIRUBINUR NEGATIVE  KETONESUR 5*  PROTEINUR NEGATIVE  NITRITE NEGATIVE  LEUKOCYTESUR NEGATIVE      Imaging: Ct Abdomen Pelvis Wo Contrast  Result Date: 03/04/2018 CLINICAL DATA:  72 year old female with left lower quadrant pain for 3 days and diarrhea.  History of diverticulitis. Subsequent encounter. EXAM: CT ABDOMEN AND PELVIS WITHOUT CONTRAST TECHNIQUE: Multidetector CT imaging of the abdomen and pelvis was performed following the standard protocol without IV contrast. COMPARISON:  02/27/2018. FINDINGS: Lower chest: Interval worsening of bibasilar parenchymal changes and development of small pleural effusions greater on the right. This raises possibility of mild pulmonary edema. Cardiomegaly with biventricular pacer/AICD in place. Dense breast parenchyma. Hepatobiliary: Cirrhotic liver. Taking into account limitation by non contrast imaging, no focal mass identified. No calcified gallstones. Possible sludge/noncalcified gallstones. Pancreas: Taking into account limitation by non contrast imaging, no worrisome pancreatic mass or inflammation. Spleen: Taking into account limitation by non contrast imaging, no splenic mass or enlargement. Adrenals/Urinary Tract: No obstructing stone or hydronephrosis. Taking into account limitation by non contrast imaging, no worrisome renal, adrenal or urinary bladder lesion. Stomach/Bowel: Inflammatory process/abscess which spans between the sigmoid colon and small bowel appears minimally more prominent compared to the 02/27/2018 CT, currently measuring 3.6 x 3 x 3.1 cm versus prior 3.2 x 2.9 x 2.9 cm. This is most likely related to sigmoid diverticulitis with secondary inflammation of small bowel loop. As small bowel loops surrounding this abscess are not completely fill with contrast, it is difficult to adequately assess small bowel. Mild wall thickening of jejunum proximal to the abscess may represent changes of enteritis and new from the prior exam. Primary enteritis as cause for abscesses felt to be less likely consideration. Vascular/Lymphatic: Atherosclerotic changes aorta and aortic branch vessels without abdominal aortic aneurysm. Scattered normal to top-normal size lymph nodes may be reactive in origin. Reproductive:  Calcified uterine fibroid impresses upon the dome of the bladder. No worrisome adnexal mass. Other: No free air seen separate from abscess collection. Minimal third spacing of fluid. Musculoskeletal: No acute osseous abnormality. IMPRESSION: 12 1. Inflammatory process/abscess which spans between the sigmoid colon and small bowel appears minimally more prominent compared to the 02/27/2018 CT, currently measuring 3.6 x 3 x 3.1 cm versus prior 3.2 x 2.9 x 2.9 cm. This is most likely related to sigmoid diverticulitis with secondary inflammation of small bowel loop. As small bowel loops surrounding this abscess are not completely fill with contrast, it is difficult to adequately assess small bowel in this region. 2. Mild wall thickening of jejunum proximal to the abscess may represent changes of enteritis and new from the prior exam. Primary enteritis as cause for abscess is felt to be a less likely consideration (based on appearance from 02/27/2018 exam). 3. Interval worsening of bibasilar parenchymal changes and development of small pleural effusions greater on the right. This raises possibility of mild pulmonary edema. 4. Cardiomegaly with biventricular pacer/AICD in place. 5. Cirrhosis. 6.  Aortic Atherosclerosis (ICD10-I70.0). 7.  Minimal third spacing of fluid. Electronically Signed   By: Genia Del M.D.   On: 03/04/2018 17:10   Dg Chest 2 View  Result Date: 03/05/2018 CLINICAL DATA:  Two days of palpitations. History of asthma-COPD, permanent pacemaker defibrillator, chronic CHF. Nonsmoker. EXAM: CHEST - 2 VIEW COMPARISON:  PA and lateral chest x-ray of Oct 05, 2017 FINDINGS: The lungs are slightly less well inflated. There is new increased density in the right infrahilar region likely in the lower lobe. The cardiac silhouette remains enlarged. The pulmonary vascularity is mildly engorged and slightly more conspicuous overall today. The ICD is in stable position. There is calcification in the wall of the  aortic arch. The bony thorax exhibits no acute abnormality. IMPRESSION: Chronic bronchitic changes, superimposed pneumonia in the right lower lobe. Low-grade CHF. Followup PA and lateral chest X-ray is recommended in 3-4 weeks following trial of antibiotic therapy to ensure resolution and exclude underlying malignancy. Thoracic aortic atherosclerosis. Electronically Signed   By: David  Martinique M.D.   On: 03/05/2018 15:06   US Renal  Result Date: 03/05/2018 CLINICAL DATA:  Acute kidney injury EXAM: RENAL / URINARY TRACT ULTRASOUND COMPLETE COMPARISON:  03/04/2018 FINDINGS: Right Kidney: Length: 10.0 cm. Increased echotexture diffusely. No mass or hydronephrosis. Left Kidney: Length: 10.0 cm. Increased echotexture diffusely. No mass or hydronephrosis. Bladder: Appears normal for degree of bladder distention. Small right effusion. IMPRESSION: Increased echotexture in the kidneys bilaterally compatible with chronic medical renal disease. No hydronephrosis. Small right effusion. Electronically Signed   By: Rolm Baptise M.D.   On: 03/05/2018 10:07     Medications:   . sodium chloride 10 mL/hr at 03/05/18 0453  . ceFEPime (MAXIPIME) IV 1 g (03/05/18 2224)  . lactated ringers    . magnesium sulfate 1 - 4 g bolus IVPB 2 g (03/06/18 1322)  . methocarbamol (ROBAXIN) IV Stopped (03/03/18 0052)  . metronidazole 500 mg (03/06/18 1326)   . amiodarone  200 mg Oral QHS  . carvedilol  25 mg Oral BID  . furosemide  80 mg Intravenous Q12H  . heparin  5,000 Units Subcutaneous Q8H  . isosorbide-hydrALAZINE  1 tablet Oral BID  . levothyroxine  125 mcg Oral QAC breakfast  . lip balm  1 application Topical BID  . nystatin  5 mL Oral QID  . pantoprazole  40 mg Oral Daily  . polyethylene glycol  17 g Oral Daily  . saccharomyces boulardii  250 mg Oral BID  . sertraline  75 mg Oral Daily  . sodium bicarbonate  650 mg Oral BID   sodium chloride, albuterol, alum & mag hydroxide-simeth, guaiFENesin-dextromethorphan,  hydrALAZINE, hydrocortisone, hydrocortisone cream, HYDROmorphone (DILAUDID) injection, iohexol, lactated ringers, magic mouthwash, menthol-cetylpyridinium, methocarbamol (ROBAXIN) IV, nitroGLYCERIN, ondansetron (ZOFRAN) IV, oxyCODONE, phenol, prochlorperazine  Assessment/ Plan:   Acute kidney injury stage IV chronic kidney disease hypertension possibly diabetes followed by [name].  Creatinine appears to have plateaued she continues on Flagyl and cefepime.  Will discontinue her IV Lasix today  Metabolic acidosis continues on oral bicarbonate  Diverticular abscess no window for IR drainage  Anemia does not appear to be an issue at this time  Hepatitis C treated with Harvoni therapy  Diabetes controlled by primary service  Volume appears to be under better control of discontinued IV Lasix     LOS: [place] @TODAY @2 :02 PM

## 2018-03-06 NOTE — Care Management Note (Signed)
Case Management Note  Patient Details  Name: Morgan Clay MRN: [id number] Date of Birth: April 11, 1946  Subjective/Objective:                  Dc planning  Action/Plan: Discharged to home with self-care, orders checked for hhc needs. No CM needs present at time of discharge.  Expected Discharge Date:  03/06/18               Expected Discharge Plan:  Home/Self Care  In-House Referral:     Discharge planning Services  CM Consult  Post Acute Care Choice:    Choice offered to:     DME Arranged:    DME Agency:     HH Arranged:    HH Agency:     Status of Service:  Completed, signed off  If discussed at [employer] of Stay Meetings, dates discussed:    Additional Comments:  Leeroy Cha, RN 03/06/2018, 3:09 PM

## 2018-03-06 NOTE — Discharge Summary (Signed)
Physician Discharge Summary  Morgan Clay TKW:[id number] DOB: 1945/06/06 DOA: 02/27/2018  PCP: [name], DO  Admit date: 02/27/2018 Discharge date: 03/06/2018  Admitted From: home Disposition:  home   Recommendations for Outpatient Follow-up:  1. Per General surgery, she needs a colonoscopy in 8 wks 2. - Bmet at next visit please in 1 wk     Discharge Condition:  stable   CODE STATUS:  Full code   Consultations:  General sugery    Discharge Diagnoses:  Principal Problem:   Diverticulitis of sigmoid colon with abscess Active Problems: AKI/   CKD (chronic kidney disease) stage 4, GFR 15-29 ml/min   hyponatremia Non anion gap metabolic acidosis Hypokalemia    Hypothyroidism   OBSTRUCTIVE SLEEP APNEA   Biventricular ICD (implantable cardioverter-defibrillator) in place   Nonischemic cardiomyopathy (HCC)   Type 2 diabetes, controlled, with renal manifestation (HCC)   Atypical atrial flutter (HCC)   Chronic obstructive pulmonary disease ([place])   Hypertension associated with diabetes ([place])   Pulmonary hypertension ([place]) on echocardiogram   Thrush     Brief Summary: Morgan Clay a 72 y.o.femalewith medical history significant ofCHF, CKD stage IV, type 2 diabetes, hypertension, hyperlipidemia, depression, diverticulitis, depression, OSA who presents to the emergency department from her PCPs office.Patient reported having abdominal pain mainly on the left side since a week. Patient also reported of having chills at home and loose stools, 10 daily on average.  Sheunderwent outpatient CT scan as per her PCP which showed sigmoid diverticulitis with 2.9 cm abscess.   Patient was also seen at her cardiologist's office for the follow-up of her CHF.  Hospital course complicated by AKI on CKD 4.  Nephrology consulted and following.  Hospital Course:  Diverticulitis with diverticular abscess - CT 10/23 showed inflammatory process involving the mid sigmoid colon  and adjacent small bowel with smallabscess in the adjacent SBmesentery. - CT 0/28 shows persistent inflammatory changes and slight increase in prominence of abscess (3.6x3x3 cm compared to 3.2x2.9x2.9 cm). No IR window for drainage  and therefore she will have conservative management at this time - General surgery recommends d/c with 10-14 days of Augmentin and feels she will need a colonoscopy in 8 wks  AKI on CKD 4 / Fluid overloaded/ hyponatremia - baseline Cr ~ 2- Cr rose to 3.64 - Nephrology consulted- given IV Lasix with resultant good diuresis and improvement in Cr to 2.55  - See ECHO below- has elevated LV filling pressures, pulm pressure and right heart pressure- no definite mention of dCHF - resume home dose of Demadex tomorrow  Thrush - started Nystatin today  Non anion gap metabolic acidosis - due to AKI- Bicarb was as low at 15 and has improved to 20 after oral Bicarb tabs 650 BID started- will continue Bicarb on d/c  Hypokalemia - K 2.9 today- have replaced - Mg 1.7- will replace as well - potassium added to home regimen as she came in to the hospital hypokalemic  Paroxysmal a flutter Continue amiodarone and carvedilol Not on anticoagulation due to history of GI bleed with small bowel AVMs and gastric ulcer  Hypertension Blood pressures well controlled Continue home antihypertensive medications  Gout cont allopurinol and colchicine    Depression  - continue Zoloft  Hypothyroidism Continue Synthroid  OSA Does not use CPAP at home  Hepatitis C  - treated with Harvoni therapy   Discharge Exam: Vitals:   03/05/18 2043 03/06/18 0511  BP: (!) 162/64 (!) 133/58  Pulse: 70 67  Resp:  19 17  Temp: 98.6 F (37 C) 98.5 F (36.9 C)  SpO2: 96% 92%   Vitals:   03/05/18 1543 03/05/18 2043 03/06/18 0511 03/06/18 0553  BP: (!) 173/69 (!) 162/64 (!) 133/58   Pulse:  70 67   Resp:  19 17   Temp:  98.6 F (37 C) 98.5 F (36.9 C)   TempSrc:      SpO2:   96% 92%   Weight:    63.5 kg    General: Pt is alert, awake, not in acute distress Cardiovascular: RRR, S1/S2 +, no rubs, no gallops Respiratory: CTA bilaterally, no wheezing, no rhonchi Abdominal: Soft, NT, ND, bowel sounds + Extremities: no edema, no cyanosis   Discharge Instructions  Discharge Instructions    Diet - low sodium heart healthy   Complete by:  As directed    Soft diet low in fiber   Increase activity slowly   Complete by:  As directed      Allergies as of 03/06/2018   No Known Allergies     Medication List    TAKE these medications   albuterol 108 (90 Base) MCG/ACT inhaler Commonly known as:  PROVENTIL HFA;VENTOLIN HFA Inhale 2 puffs into the lungs every 6 (six) hours as needed for wheezing or shortness of breath.   allopurinol 100 MG tablet Commonly known as:  ZYLOPRIM Take 1/2 tablet by mouth daily What changed:    how much to take  how to take this  when to take this  additional instructions   alum & mag hydroxide-simeth [phone] MG/5ML suspension Commonly known as:  MAALOX/MYLANTA Take 30 mLs by mouth every 6 (six) hours as needed for indigestion, heartburn or flatulence.   amiodarone 200 MG tablet Commonly known as:  PACERONE Take 200 mg by mouth at bedtime.   BIDIL 20-37.5 MG tablet Generic drug:  isosorbide-hydrALAZINE TAKE 1 TABLET BY MOUTH TWICE DAILY   bisacodyl 10 MG suppository Commonly known as:  DULCOLAX Place 1 suppository (10 mg total) rectally as needed for moderate constipation.   carvedilol 25 MG tablet Commonly known as:  COREG Take 25 mg by mouth 2 (two) times daily.   ciprofloxacin 500 MG tablet Commonly known as:  CIPRO Take 1 tablet (500 mg total) by mouth 2 (two) times daily for 14 days.   colchicine 0.6 MG tablet Take 0.6 mg by mouth daily as needed (gout flares).   cyanocobalamin 1000 MCG/ML injection Commonly known as:  (VITAMIN B-12) Inject 1,000 mcg into the muscle every 30 (thirty) days.    dicyclomine 10 MG capsule Commonly known as:  BENTYL Take 1 capsule (10 mg total) by mouth 3 (three) times daily before meals.   hydrocortisone 2.5 % rectal cream Commonly known as:  ANUSOL-HC Apply 1 application topically 4 (four) times daily as needed for hemorrhoids.   levothyroxine 125 MCG tablet Commonly known as:  SYNTHROID, LEVOTHROID Take 125 mcg by mouth daily before breakfast.   menthol-cetylpyridinium 3 MG lozenge Commonly known as:  CEPACOL Take 1 lozenge (3 mg total) by mouth as needed for sore throat (throat irritation / pain).   metroNIDAZOLE 500 MG tablet Commonly known as:  FLAGYL Take 1 tablet (500 mg total) by mouth 3 (three) times daily for 14 days.   nitroGLYCERIN 0.4 MG SL tablet Commonly known as:  NITROSTAT Place 0.4 mg under the tongue every 5 (five) minutes as needed for chest pain.   nystatin 100000 UNIT/ML suspension Commonly known as:  MYCOSTATIN Take 5 mLs (500,000  Units total) by mouth 4 (four) times daily.   oxyCODONE 5 MG immediate release tablet Commonly known as:  Oxy IR/ROXICODONE Take 1 tablet (5 mg total) by mouth every 6 (six) hours as needed for moderate pain.   pantoprazole 40 MG tablet Commonly known as:  PROTONIX Take 1 tablet (40 mg total) by mouth daily.   polyethylene glycol packet Commonly known as:  MIRALAX / GLYCOLAX Take 17 g by mouth 2 (two) times daily. What changed:    when to take this  reasons to take this   Potassium Chloride ER 20 MEQ Tbcr Take 20 mEq by mouth daily. What changed:  Another medication with the same name was added. Make sure you understand how and when to take each.   Potassium Chloride ER 20 MEQ Tbcr Take 20 mEq by mouth daily. Do not take if Demedex is stopped What changed:  You were already taking a medication with the same name, and this prescription was added. Make sure you understand how and when to take each.   saccharomyces boulardii 250 MG capsule Commonly known as:   FLORASTOR Take 1 capsule (250 mg total) by mouth 2 (two) times daily.   sertraline 50 MG tablet Commonly known as:  ZOLOFT Take 1.5 tablets (75 mg total) by mouth daily.   sodium bicarbonate 650 MG tablet Take 1 tablet (650 mg total) by mouth 2 (two) times daily.   torsemide 20 MG tablet Commonly known as:  DEMADEX Take 1 tablet (20 mg total) by mouth daily.      Follow-up Information    [name], DO. Schedule an appointment as soon as possible for a visit in 1 week(s).   Specialty:  Family Medicine Why:  you will need the following blood work: Advertising account executive information: [place] [address] [phone]        Michael Boston, MD. Go on 03/18/2018.   Specialty:  General Surgery Why:  at 10:15 AM to discuss surgical treatment of recurrent diverticulitis. please arrive by 9:45 to get checked in and fill out any necessary paperwork. Contact information: [address] [place] [address] [phone]          No Known Allergies   Procedures/Studies: 2 D ECHO Study Conclusions  - Left ventricle: The cavity size was normal. There was moderate   focal basal hypertrophy. Systolic function was normal. The   estimated ejection fraction was in the range of 55% to 60%. Wall   motion was normal; there were no regional wall motion   abnormalities. Doppler parameters are consistent with high   ventricular filling pressure. - Aortic valve: Trileaflet; mildly thickened, mildly calcified   leaflets. There was moderate regurgitation. - Mitral valve: There was mild to moderate regurgitation. - Left atrium: The atrium was severely dilated. - Right ventricle: The cavity size was moderately dilated. Wall   thickness was normal. Pacer wire or catheter noted in right   ventricle. - Right atrium: Pacer wire or catheter noted in right atrium. - Pulmonary arteries: PA peak pressure: 60 mm Hg (S). - Line: A venous catheter was visualized in  the superior vena cava,   with its tip in the right atrium. No abnormal features noted.  Impressions:  - The right ventricular systolic pressure was increased consistent   with moderate pulmonary hypertension.  Ct Abdomen Pelvis Wo Contrast  Result Date: 03/04/2018 CLINICAL DATA:  72 year old female with left lower quadrant pain for 3 days and diarrhea.  History of diverticulitis. Subsequent encounter. EXAM: CT ABDOMEN AND PELVIS WITHOUT CONTRAST TECHNIQUE: Multidetector CT imaging of the abdomen and pelvis was performed following the standard protocol without IV contrast. COMPARISON:  02/27/2018. FINDINGS: Lower chest: Interval worsening of bibasilar parenchymal changes and development of small pleural effusions greater on the right. This raises possibility of mild pulmonary edema. Cardiomegaly with biventricular pacer/AICD in place. Dense breast parenchyma. Hepatobiliary: Cirrhotic liver. Taking into account limitation by non contrast imaging, no focal mass identified. No calcified gallstones. Possible sludge/noncalcified gallstones. Pancreas: Taking into account limitation by non contrast imaging, no worrisome pancreatic mass or inflammation. Spleen: Taking into account limitation by non contrast imaging, no splenic mass or enlargement. Adrenals/Urinary Tract: No obstructing stone or hydronephrosis. Taking into account limitation by non contrast imaging, no worrisome renal, adrenal or urinary bladder lesion. Stomach/Bowel: Inflammatory process/abscess which spans between the sigmoid colon and small bowel appears minimally more prominent compared to the 02/27/2018 CT, currently measuring 3.6 x 3 x 3.1 cm versus prior 3.2 x 2.9 x 2.9 cm. This is most likely related to sigmoid diverticulitis with secondary inflammation of small bowel loop. As small bowel loops surrounding this abscess are not completely fill with contrast, it is difficult to adequately assess small bowel. Mild wall thickening of jejunum  proximal to the abscess may represent changes of enteritis and new from the prior exam. Primary enteritis as cause for abscesses felt to be less likely consideration. Vascular/Lymphatic: Atherosclerotic changes aorta and aortic branch vessels without abdominal aortic aneurysm. Scattered normal to top-normal size lymph nodes may be reactive in origin. Reproductive: Calcified uterine fibroid impresses upon the dome of the bladder. No worrisome adnexal mass. Other: No free air seen separate from abscess collection. Minimal third spacing of fluid. Musculoskeletal: No acute osseous abnormality. IMPRESSION: 12 1. Inflammatory process/abscess which spans between the sigmoid colon and small bowel appears minimally more prominent compared to the 02/27/2018 CT, currently measuring 3.6 x 3 x 3.1 cm versus prior 3.2 x 2.9 x 2.9 cm. This is most likely related to sigmoid diverticulitis with secondary inflammation of small bowel loop. As small bowel loops surrounding this abscess are not completely fill with contrast, it is difficult to adequately assess small bowel in this region. 2. Mild wall thickening of jejunum proximal to the abscess may represent changes of enteritis and new from the prior exam. Primary enteritis as cause for abscess is felt to be a less likely consideration (based on appearance from 02/27/2018 exam). 3. Interval worsening of bibasilar parenchymal changes and development of small pleural effusions greater on the right. This raises possibility of mild pulmonary edema. 4. Cardiomegaly with biventricular pacer/AICD in place. 5. Cirrhosis. 6.  Aortic Atherosclerosis (ICD10-I70.0). 7. Minimal third spacing of fluid. Electronically Signed   By: Genia Del M.D.   On: 03/04/2018 17:10   Ct Abdomen Pelvis Wo Contrast  Result Date: 02/27/2018 CLINICAL DATA:  Bilateral lower quadrant pain. Chills. Diarrhea. Chronic hepatitis-C. EXAM: CT ABDOMEN AND PELVIS WITHOUT CONTRAST TECHNIQUE: Multidetector CT imaging  of the abdomen and pelvis was performed following the standard protocol without IV contrast. COMPARISON:  08/03/2017 FINDINGS: Lower chest: No acute findings. Hepatobiliary: Hepatic cirrhosis again demonstrated. No mass visualized on this unenhanced exam. High attenuation gallbladder sludge or tiny noncalcified gallstones noted. No evidence of cholecystitis or biliary ductal dilatation. Pancreas: No mass or inflammatory process visualized on this unenhanced exam. Spleen:  Within normal limits in size. Adrenals/Urinary tract: No evidence of urolithiasis or hydronephrosis. Unremarkable unopacified urinary bladder. Stomach/Bowel: Diverticulosis  is seen involving the descending and proximal sigmoid colon. Moderate wall thickening and adjacent inflammatory changes are seen involving the sigmoid colon. There is also wall thickening involving an adjacent small bowel loop in the central pelvis. In addition, there is a small fluid and gas containing collection in the central small bowel mesentery measuring 2.9 x 2.1 cm on image 51/3. No evidence of free intraperitoneal air. No evidence of bowel obstruction. Vascular/Lymphatic: No pathologically enlarged lymph nodes identified. No evidence of abdominal aortic aneurysm. Aortic atherosclerosis. Reproductive: Several calcified uterine fibroids are seen, largest measuring 6 cm. Adnexal regions are unremarkable in appearance. Other:  None. Musculoskeletal:  No suspicious bone lesions identified. IMPRESSION: Inflammatory process involving the mid sigmoid colon and adjacent small bowel loop, with small abscess in the adjacent small bowel mesentery measuring 2.9 cm. This is suspicious for sigmoid diverticulitis with small diverticular abscess, with primary enteritis considered less likely. Several calcified uterine fibroids, largest measuring 6 cm. Hepatic cirrhosis. Gallbladder sludge versus tiny gallstones. Electronically Signed   By: Earle Gell M.D.   On: 02/27/2018 10:44   Dg  Chest 2 View  Result Date: 03/05/2018 CLINICAL DATA:  Two days of palpitations. History of asthma-COPD, permanent pacemaker defibrillator, chronic CHF. Nonsmoker. EXAM: CHEST - 2 VIEW COMPARISON:  PA and lateral chest x-ray of Oct 05, 2017 FINDINGS: The lungs are slightly less well inflated. There is new increased density in the right infrahilar region likely in the lower lobe. The cardiac silhouette remains enlarged. The pulmonary vascularity is mildly engorged and slightly more conspicuous overall today. The ICD is in stable position. There is calcification in the wall of the aortic arch. The bony thorax exhibits no acute abnormality. IMPRESSION: Chronic bronchitic changes, superimposed pneumonia in the right lower lobe. Low-grade CHF. Followup PA and lateral chest X-ray is recommended in 3-4 weeks following trial of antibiotic therapy to ensure resolution and exclude underlying malignancy. Thoracic aortic atherosclerosis. Electronically Signed   By: David  Martinique M.D.   On: 03/05/2018 15:06   US Renal  Result Date: 03/05/2018 CLINICAL DATA:  Acute kidney injury EXAM: RENAL / URINARY TRACT ULTRASOUND COMPLETE COMPARISON:  03/04/2018 FINDINGS: Right Kidney: Length: 10.0 cm. Increased echotexture diffusely. No mass or hydronephrosis. Left Kidney: Length: 10.0 cm. Increased echotexture diffusely. No mass or hydronephrosis. Bladder: Appears normal for degree of bladder distention. Small right effusion. IMPRESSION: Increased echotexture in the kidneys bilaterally compatible with chronic medical renal disease. No hydronephrosis. Small right effusion. Electronically Signed   By: Rolm Baptise M.D.   On: 03/05/2018 10:07     The results of significant diagnostics from this hospitalization (including imaging, microbiology, ancillary and laboratory) are listed below for reference.     Microbiology: No results found for this or any previous visit (from the past 240 hour(s)).   Labs: BNP (last 3  results) Recent Labs    07/31/17 1229  BNP 485.4*   Basic Metabolic Panel: Recent Labs  Lab 03/02/18 0527 03/03/18 0741 03/04/18 0557 03/05/18 0555 03/06/18 0754  NA 141 136 132* 130* 136  K 3.6 3.3* 3.7 4.1 2.9*  CL 107 103 103 100 101  CO2 18* 19* 15* 16* 20*  GLUCOSE 71 92 116* 78 75  BUN 31* 33* 31* 31* 30*  CREATININE 2.50* 3.16* 3.64* 3.52* 2.55*  CALCIUM 9.3 8.8* 8.6* 8.3* 8.7*  MG  --   --  1.8  --  1.7   Liver Function Tests: Recent Labs  Lab 02/27/18 1730 03/05/18 0555  AST 56*  29  ALT 30 23  ALKPHOS 80 70  BILITOT 1.8* 1.1  PROT 8.2* 7.1  ALBUMIN 3.7 3.0*   Recent Labs  Lab 02/27/18 1730  LIPASE 39   No results for input(s): AMMONIA in the last 168 hours. CBC: Recent Labs  Lab 02/28/18 0549 03/01/18 0944 03/02/18 0527 03/04/18 0557 03/05/18 0555  WBC 7.1 8.5 8.4 7.6 9.3  NEUTROABS 5.3 6.5  --   --  7.4  HGB 12.7 12.6 13.0 12.3 11.6*  HCT 42.0 40.3 43.0 40.4 36.6  MCV 88.2 85.7 87.9 87.6 85.1  PLT 276 263 285 264 245   Cardiac Enzymes: No results for input(s): CKTOTAL, CKMB, CKMBINDEX, TROPONINI in the last 168 hours. BNP: Invalid input(s): POCBNP CBG: Recent Labs  Lab 03/04/18 1146 03/04/18 1548 03/04/18 2036 03/05/18 0731 03/05/18 1205  GLUCAP 91 87 78 85 74   D-Dimer No results for input(s): DDIMER in the last 72 hours. Hgb A1c No results for input(s): HGBA1C in the last 72 hours. Lipid Profile No results for input(s): CHOL, HDL, LDLCALC, TRIG, CHOLHDL, LDLDIRECT in the last 72 hours. Thyroid function studies No results for input(s): TSH, T4TOTAL, T3FREE, THYROIDAB in the last 72 hours.  Invalid input(s): FREET3 Anemia work up No results for input(s): VITAMINB12, FOLATE, FERRITIN, TIBC, IRON, RETICCTPCT in the last 72 hours. Urinalysis    Component Value Date/Time   COLORURINE YELLOW 03/05/2018 0902   APPEARANCEUR CLEAR 03/05/2018 0902   LABSPEC 1.008 03/05/2018 0902   PHURINE 5.0 03/05/2018 0902   GLUCOSEU  NEGATIVE 03/05/2018 0902   HGBUR NEGATIVE 03/05/2018 0902   HGBUR negative 02/28/2008 0000   BILIRUBINUR NEGATIVE 03/05/2018 0902   KETONESUR 5 (A) 03/05/2018 0902   PROTEINUR NEGATIVE 03/05/2018 0902   UROBILINOGEN 1.0 07/19/2010 1752   NITRITE NEGATIVE 03/05/2018 0902   LEUKOCYTESUR NEGATIVE 03/05/2018 0902   Sepsis Labs Invalid input(s): PROCALCITONIN,  WBC,  LACTICIDVEN Microbiology No results found for this or any previous visit (from the past 240 hour(s)).   Time coordinating discharge in minutes: 65  SIGNED:   Debbe Odea, MD  [facility] 03/06/2018, 2:22 PM Pager   If 7PM-7AM, please contact night-coverage [email or web address] Password [email or web address]

## 2018-03-06 NOTE — Telephone Encounter (Signed)
Pt called no answer voice mail for pt to call back EF 55-60% with moderate pulmonary hypertension and moderate aortic insufficiency.

## 2018-03-06 NOTE — Progress Notes (Signed)
Physical Therapy Treatment Patient Details Name: Morgan Clay MRN: [id number] DOB: 13-Oct-1945 Today's Date: 03/06/2018    History of Present Illness 72 yo female admitted by PCP on 10/23 for diverticulitis of sigmoid colon with abscess. Pt admitted earlier this year for similar diagnosis. PMH includes afib, AICD, anemia, OA, chronic bronchitis, CHF, depression, HTN, LBBB, lower CI bleed, obesity, OSA, osteopenia, diarrhea, ARF, COPD, atrial flutter, cardiomyopathy.     PT Comments    Pt limited by abdominal pain this session. Pt ambulated same distance as on eval, and pt more fatigued after this session suspected due to pain. At end of ambulation, pt had two telemetry alarms of v-tach and v-fib. Pt asymptomatic, RN notified and RN states telemetry staff called her, and they suspect artifact noise. PT to continue to follow acutely, pt plans to d/c today.    Follow Up Recommendations  Home health PT     Equipment Recommendations  Rolling walker with 5" wheels    Recommendations for Other Services       Precautions / Restrictions Precautions Precautions: Fall Restrictions Weight Bearing Restrictions: No    Mobility  Bed Mobility Overal bed mobility: Needs Assistance Bed Mobility: Supine to Sit;Sit to Supine     Supine to sit: HOB elevated;Supervision Sit to supine: Supervision;HOB elevated   General bed mobility comments: Supervision for safety. Pt educated on log rolling to minimize abdominal discomfort, pt initiating supine<>sit before PT could assist pt in log roll.   Transfers Overall transfer level: Needs assistance Equipment used: None(Pt defers use of RW ) Transfers: Sit to/from Stand Sit to Stand: Min guard         General transfer comment: Min guard for safety. Pt did not want to use RW to steady her. Pt without IV pole this session.   Ambulation/Gait Ambulation/Gait assistance: Min guard;Supervision Gait Distance (Feet): 250 Feet Assistive device:  None Gait Pattern/deviations: Step-through pattern;Decreased stride length;Wide base of support;Antalgic Gait velocity: decr   General Gait Details: Pt reaching for environment for steadying intermittently during ambulation. When asked again to use RW, pt states she doesn't need it. Pt with increased abdominal discomfort this session, and varying gait speed. Pt's telemetry registered "v-tach/v-fib" alarm x2 at end of ambulation, pt completely asymptomatic. Alarm lasted a few seconds, then ceased.    Stairs             Wheelchair Mobility    Modified Rankin (Stroke Patients Only)       Balance Overall balance assessment: Needs assistance Sitting-balance support: No upper extremity supported Sitting balance-Leahy Scale: Good     Standing balance support: No upper extremity supported Standing balance-Leahy Scale: Fair                   Standardized Balance Assessment Standardized Balance Assessment : Dynamic Gait Index   Dynamic Gait Index Level Surface: Mild Impairment Change in Gait Speed: Mild Impairment Gait with Horizontal Head Turns: Mild Impairment Gait with Vertical Head Turns: Mild Impairment Gait and Pivot Turn: Mild Impairment Step Over Obstacle: Mild Impairment Step Around Obstacles: Normal Steps: Mild Impairment(not attempted, estimating based on pt's performance) Total Score: 17      Cognition Arousal/Alertness: Awake/alert Behavior During Therapy: WFL for tasks assessed/performed Overall Cognitive Status: Within Functional Limits for tasks assessed  Exercises      General Comments        Pertinent Vitals/Pain Pain Assessment: 0-10 Pain Score: 7  Pain Location: abdomen  Pain Descriptors / Indicators: Guarding;Aching;Sore Pain Intervention(s): Limited activity within patient's tolerance;Repositioned;Monitored during session;Premedicated before session    Home Living                       Prior Function            PT Goals (current goals can now be found in the care plan section) Acute Rehab PT Goals Patient Stated Goal: none stated  PT Goal Formulation: With patient Time For Goal Achievement: 03/19/18 Potential to Achieve Goals: Good Progress towards PT goals: Progressing toward goals    Frequency    Min 3X/week      PT Plan Current plan remains appropriate    Co-evaluation              AM-PAC PT "6 Clicks" Daily Activity  Outcome Measure  Difficulty turning over in bed (including adjusting bedclothes, sheets and blankets)?: Unable Difficulty moving from lying on back to sitting on the side of the bed? : Unable Difficulty sitting down on and standing up from a chair with arms (e.g., wheelchair, bedside commode, etc,.)?: Unable Help needed moving to and from a bed to chair (including a wheelchair)?: None Help needed walking in hospital room?: A Little Help needed climbing 3-5 steps with a railing? : A Little 6 Click Score: 13    End of Session   Activity Tolerance: Patient limited by pain Patient left: in bed;with call bell/phone within reach Nurse Communication: Mobility status;Other (comment)(EKG alarm) PT Visit Diagnosis: Unsteadiness on feet (R26.81);Other abnormalities of gait and mobility (R26.89)     Time: 2130-8657 PT Time Calculation (min) (ACUTE ONLY): 13 min  Charges:  $Gait Training: 8-22 mins                     Morgan Clay, PT Acute Rehabilitation Services Pager [phone]  Office [phone]    Morgan Clay D Morgan Clay 03/06/2018, 1:37 PM

## 2018-03-06 NOTE — Progress Notes (Signed)
Pt ambulated in the hallway on RA. Pts O2 sat remained above 93% while ambulating.

## 2018-03-07 ENCOUNTER — Other Ambulatory Visit: Payer: Self-pay | Admitting: Surgery

## 2018-03-07 DIAGNOSIS — K5780 Diverticulitis of intestine, part unspecified, with perforation and abscess without bleeding: Secondary | ICD-10-CM

## 2018-03-08 ENCOUNTER — Telehealth (HOSPITAL_COMMUNITY): Payer: Self-pay

## 2018-03-08 ENCOUNTER — Inpatient Hospital Stay: Payer: Medicare Other

## 2018-03-08 ENCOUNTER — Inpatient Hospital Stay: Payer: Medicare Other | Attending: Oncology

## 2018-03-08 DIAGNOSIS — R197 Diarrhea, unspecified: Secondary | ICD-10-CM | POA: Diagnosis not present

## 2018-03-08 DIAGNOSIS — K5732 Diverticulitis of large intestine without perforation or abscess without bleeding: Secondary | ICD-10-CM | POA: Insufficient documentation

## 2018-03-08 DIAGNOSIS — D649 Anemia, unspecified: Secondary | ICD-10-CM

## 2018-03-08 DIAGNOSIS — K7460 Unspecified cirrhosis of liver: Secondary | ICD-10-CM | POA: Diagnosis not present

## 2018-03-08 DIAGNOSIS — J90 Pleural effusion, not elsewhere classified: Secondary | ICD-10-CM | POA: Diagnosis not present

## 2018-03-08 DIAGNOSIS — D509 Iron deficiency anemia, unspecified: Secondary | ICD-10-CM | POA: Insufficient documentation

## 2018-03-08 DIAGNOSIS — D259 Leiomyoma of uterus, unspecified: Secondary | ICD-10-CM | POA: Diagnosis not present

## 2018-03-08 DIAGNOSIS — I700 Atherosclerosis of aorta: Secondary | ICD-10-CM | POA: Diagnosis not present

## 2018-03-08 DIAGNOSIS — D631 Anemia in chronic kidney disease: Secondary | ICD-10-CM | POA: Insufficient documentation

## 2018-03-08 DIAGNOSIS — K5792 Diverticulitis of intestine, part unspecified, without perforation or abscess without bleeding: Secondary | ICD-10-CM | POA: Diagnosis not present

## 2018-03-08 DIAGNOSIS — I129 Hypertensive chronic kidney disease with stage 1 through stage 4 chronic kidney disease, or unspecified chronic kidney disease: Secondary | ICD-10-CM | POA: Insufficient documentation

## 2018-03-08 DIAGNOSIS — K529 Noninfective gastroenteritis and colitis, unspecified: Secondary | ICD-10-CM | POA: Insufficient documentation

## 2018-03-08 DIAGNOSIS — Z79899 Other long term (current) drug therapy: Secondary | ICD-10-CM | POA: Diagnosis not present

## 2018-03-08 DIAGNOSIS — N189 Chronic kidney disease, unspecified: Secondary | ICD-10-CM | POA: Insufficient documentation

## 2018-03-08 LAB — CBC WITH DIFFERENTIAL (CANCER CENTER ONLY)
Abs Immature Granulocytes: 0.19 10*3/uL — ABNORMAL HIGH (ref 0.00–0.07)
BASOS ABS: 0.1 10*3/uL (ref 0.0–0.1)
Basophils Relative: 1 %
EOS ABS: 0 10*3/uL (ref 0.0–0.5)
Eosinophils Relative: 0 %
HEMATOCRIT: 36.9 % (ref 36.0–46.0)
HEMOGLOBIN: 11.9 g/dL — AB (ref 12.0–15.0)
Immature Granulocytes: 2 %
LYMPHS PCT: 7 %
Lymphs Abs: 0.7 10*3/uL (ref 0.7–4.0)
MCH: 27 pg (ref 26.0–34.0)
MCHC: 32.2 g/dL (ref 30.0–36.0)
MCV: 83.7 fL (ref 80.0–100.0)
MONO ABS: 0.9 10*3/uL (ref 0.1–1.0)
Monocytes Relative: 9 %
NEUTROS PCT: 81 %
NRBC: 0 % (ref 0.0–0.2)
Neutro Abs: 8.2 10*3/uL — ABNORMAL HIGH (ref 1.7–7.7)
Platelet Count: 220 10*3/uL (ref 150–400)
RBC: 4.41 MIL/uL (ref 3.87–5.11)
RDW: 18.2 % — AB (ref 11.5–15.5)
WBC Count: 10 10*3/uL (ref 4.0–10.5)

## 2018-03-08 NOTE — Telephone Encounter (Signed)
Pt called no answer voice mail for pt to call back EF 55-60% with moderate pulmonary hypertension and moderate aortic insufficiency.

## 2018-03-08 NOTE — Progress Notes (Signed)
Pt here today for aranesp injection HGB 11.9 per plan parameters Pt. Did not receive injection.

## 2018-03-11 ENCOUNTER — Telehealth: Payer: Self-pay | Admitting: *Deleted

## 2018-03-11 NOTE — Telephone Encounter (Signed)
   [facility] Pre-operative Risk Assessment    Request for surgical clearance:  1. What type of surgery is being performed? COLECTOMY  When is this surgery scheduled?  TBD 2. What type of clearance is required (medical clearance vs. Pharmacy clearance to hold med vs. Both)? BOTH  PT ALSO HAS BI VI ICD  3. Are there any medications that need to be held prior to surgery and how long?  ELIQUIS  4. Practice name and name of physician performing surgery?  [facility]  DR Remo Lipps [facility]  5. What is your office phone number [phone]   7.   What is your office fax number [phone]  8.   Anesthesia type (None, local, MAC, general) ? GENERAL   Devra Dopp 03/11/2018, 10:43 AM  _________________________________________________________________   (provider comments below)

## 2018-03-12 ENCOUNTER — Ambulatory Visit
Admission: RE | Admit: 2018-03-12 | Discharge: 2018-03-12 | Disposition: A | Discharge status: Home / Self Care | Payer: Medicare Other | Source: Ambulatory Visit | Attending: Surgery | Admitting: Surgery

## 2018-03-12 ENCOUNTER — Other Ambulatory Visit: Payer: Self-pay

## 2018-03-12 DIAGNOSIS — K5792 Diverticulitis of intestine, part unspecified, without perforation or abscess without bleeding: Secondary | ICD-10-CM | POA: Diagnosis not present

## 2018-03-12 DIAGNOSIS — K5780 Diverticulitis of intestine, part unspecified, with perforation and abscess without bleeding: Secondary | ICD-10-CM

## 2018-03-12 NOTE — Telephone Encounter (Signed)
Pt not on anticoagulation. Eliquis stopped 12/2017 hospitalization due to recurrent bleeding.

## 2018-03-12 NOTE — Patient Outreach (Signed)
[facility]) Care Management  03/12/2018  Morgan Clay 1946-03-05 [id number]     EMMI-General Discharge RED ON EMMI ALERT Day # 4 Date: 03/11/18 Red Alert Reason: "Unfilled prescriptions? Yes  Able to fill today/tomorrow? No Other questions/problems? Yes  Sad/hopeless/anxious/empty? Yes"    Outreach attempt # 1 to patient.  Spoke with patient who voices she is doing fine. Reviewed and addressed red alerts with patient. Patient became upset stating she did not respond to any of those questions in that manner. Advised patient that sometimes machine has trouble hearing response and that is why any abnormal responses triggers a call from a live nurse to check on them. She voices she has all her meds. She knows when and how to take them and has no concerns regarding them. She has MD follow up appt in place and no issues with transportation. Patient denies having any problems or concerns as well as she denies feeling sad or down. Patient has completed automated post discharge EMMI-General  calls. Patient verbalizes no further RN CM needs or concerns at this time.      Plan: RN CM will close case at this time.    Enzo Montgomery, RN,BSN,CCM [facility] Telephonic Care Management Coordinator Direct Phone: [phone] Toll Free: [phone] Fax: [phone]

## 2018-03-13 NOTE — Telephone Encounter (Addendum)
   Primary Cardiologist: Virl Axe, MD  Chart reviewed as part of pre-operative protocol coverage. See below. Patient is followed by Advanced Heart Failure team. Per protocol, this box does not handle their clearances so will forward to Dr. [name] to review and communicate decision/clearance with his team. Will remove this message from general pre-op box.  Charlie Pitter, PA-C 03/13/2018, 2:24 PM

## 2018-03-13 NOTE — Telephone Encounter (Signed)
EF has recovered, she is not on Eliquis. [place] for surgery.

## 2018-03-14 NOTE — Telephone Encounter (Signed)
Note faxed to [facility]

## 2018-03-18 ENCOUNTER — Ambulatory Visit: Payer: Self-pay | Admitting: Surgery

## 2018-03-18 DIAGNOSIS — Z01818 Encounter for other preprocedural examination: Secondary | ICD-10-CM | POA: Diagnosis not present

## 2018-03-18 DIAGNOSIS — R197 Diarrhea, unspecified: Secondary | ICD-10-CM | POA: Diagnosis not present

## 2018-03-18 DIAGNOSIS — K5780 Diverticulitis of intestine, part unspecified, with perforation and abscess without bleeding: Secondary | ICD-10-CM | POA: Diagnosis not present

## 2018-03-18 NOTE — H&P (Signed)
Jeri Lager Documented: 03/18/2018 10:06 AM Location: [facility] Patient #: [phone] DOB: Sep 08, 1945 Single / Language: [name] / Race: Black or African American Female  History of Present Illness Adin Hector MD; 03/18/2018 10:47 AM) The patient is a 72 year old female who presents with diverticulitis. Note for "Diverticulitis": ` ` ` Patient sent for surgical consultation for follow-up for diverticulitis with abscess.  ` ` The patient is a pleasant with numerous health issues. Cardiac dysrhythmia with cardioversion and pacemaker. Heart failure improved. Was on Eliquis blood thinner now off. At episodes of diverticulitis. Mild attacks in the past. Was in the hospital in the spring with a more severe attack in March of this year. Follow-up with gastroenterology. Colonoscopy revealed diverticulosis no other abnormalities. Had a recurrent attack with an abscess. Admitted on antibiotics. She comes here for follow-up. She had a repeat CAT scan which of the abscess slightly smaller but not resolved. Interloop/central. No good window for drainage. Smaller and 4 cm. Trying to manage with antibiotics. She comes in today with her daughter. Her appetite is down but she is keeping a food down. Has small frequent bowel movements. Not trying to slow down. Usually has constipation issues. Has not tried any Imodium. Has been sticking mainly to a liquid diet. Soreness has gone down. Energy level slightly improving but not great. Has chronic kidney disease followed by Dr. [name]. Followed by Dr. [name] & Caryl Comes for her dysrhythmias and other cardiac issues. Cleared for surgery from their standpoint. No longer on blood thinners.  No personal nor family history of GI/colon cancer, inflammatory bowel disease, irritable bowel syndrome, allergy such as Celiac Sprue, dietary/dairy problems, colitis, ulcers nor gastritis. No recent sick contacts/gastroenteritis.  No travel outside the country. No changes in diet. No dysphagia to solids or liquids. No significant heartburn or reflux. No hematochezia, hematemesis, coffee ground emesis. No evidence of prior gastric/peptic ulceration. Some question of liver changes and cirrhosis but no history of portal hypertension or gastric bleeding. Had elevated bilirubin once. Otherwise has been normal. No varices seen in July. History of gout.  (Review of systems as stated in this history (HPI) or in the review of systems. Otherwise all other 12 point ROS are negative) ` ` ` CLINICAL DATA: Diverticulitis, follow-up; history chronic diastolic CHF, non ischemic cardiomyopathy, type II diabetes mellitus, COPD, hypertension  EXAM: CT ABDOMEN AND PELVIS WITHOUT CONTRAST  TECHNIQUE: Multidetector CT imaging of the abdomen and pelvis was performed following the standard protocol without IV contrast. Sagittal and coronal MPR images reconstructed from axial data set. No oral contrast was administered.  COMPARISON: 03/04/2018  FINDINGS: Lower chest: Small RIGHT pleural effusion. Infiltrates identified at bases of RIGHT middle and RIGHT lower lobes. Enlargement of cardiac silhouette with pacemaker leads at RIGHT atrium, RIGHT ventricle and coronary sinus. Heart enlarged.  Hepatobiliary: Nodular hepatic margins compatible with cirrhosis. Dilated IVC and hepatic veins consistent with passive congestion. Gallbladder unremarkable. No focal hepatic mass lesions.  Pancreas: Normal appearance  Spleen: Normal size and appearance. Probable tiny splenule inferior to spleen.  Adrenals/Urinary Tract: Thickening of adrenal glands without mass. Ptotic RIGHT kidney. No definite renal mass or hydronephrosis. Ureters and bladder unremarkable.  Stomach/Bowel: Appendix surgically absent by history. Medication tablet within stomach. Stomach and small bowel loops otherwise normal appearance. Diverticulosis of  descending and sigmoid colon with mild sigmoid wall thickening favor due to sigmoid diverticulitis. Small associated gas/fluid collection between sigmoid colon and small bowel with a thick surrounding wall, overall 2.3 x  2.4 x 2.2 cm, containing slightly more gas than on the prior study and overall slightly smaller in size. Remainder of colon unremarkable.  Vascular/Lymphatic: Atherosclerotic calcifications aorta without aneurysm. No adenopathy.  Reproductive: Calcified uterine leiomyomata largest 5.2 x 5.0 x 5.8 cm. Unremarkable adnexa.  Other: No free air or free fluid. Small LEFT inguinal hernia containing fat and minimal fluid.  Musculoskeletal: Mild facet degenerative changes lumbar spine. No acute osseous findings.  IMPRESSION: Sigmoid diverticulosis with persistent wall thickening of sigmoid colon and adjacent infiltrative changes between sigmoid colon and small bowel mesenteric most consistent with sigmoid diverticulitis.  Small adjacent abscess collection cranial anterior to the sigmoid colon 2.3 x 2.4 x 2.2 cm in size, slightly decreased in size since previous exam.  Probable cirrhotic liver.  Enlargement of cardiac chambers post pacemaker with passive congestion of liver.  Calcified uterine leiomyomata.  Small LEFT inguinal hernia containing fat and minimal fluid.   Electronically Signed By: Lavonia [name] M.D. On: 03/13/2018 08:23   Past Surgical History Sabino Gasser, [place]; 03/18/2018 10:07 AM) Appendectomy  Diagnostic Studies History Sabino Gasser, [place]; 03/18/2018 10:07 AM) Mammogram within last year Pap Smear 1-5 years ago  Allergies Sabino Gasser, [place]; 03/18/2018 10:07 AM) No Known Drug Allergies [03/18/2018]: Allergies Reconciled  Medication History Sabino Gasser, CMA; 03/18/2018 10:09 AM) Amiodarone HCl (200MG  Tablet, Oral) Active. Levothyroxine Sodium (125MCG Tablet, Oral) Active. Nystatin (100000 UNIT/ML Suspension, Mouth/Throat)  Active. Dicyclomine HCl (10MG  Capsule, Oral) Active. metroNIDAZOLE (500MG  Tablet, Oral) Active. metOLazone (2.5MG  Tablet, Oral) Active. Colchicine (0.6MG  Tablet, Oral) Active. Ciprofloxacin HCl (500MG  Tablet, Oral) Active. Carvedilol (25MG  Tablet, Oral) Active. Allopurinol (100MG  Tablet, Oral) Active. oxyCODONE HCl (5MG  Tablet, Oral) Active. Torsemide (20MG  Tablet, Oral) Active. Potassium Chloride ER (20MEQ Tablet ER, Oral) Active. Sodium Bicarbonate (650MG  Tablet, Oral) Active. Amoxicillin-Pot Clavulanate (875-125MG  Tablet, 1 (one) Oral two times daily, Taken starting 03/14/2018) Active. Medications Reconciled  Social History Sabino Gasser, CMA; 03/18/2018 10:07 AM) Alcohol use Moderate alcohol use. Caffeine use Carbonated beverages, Coffee.  Family History Sabino Gasser, [place]; 03/18/2018 10:07 AM) Alcohol Abuse Brother, Daughter, Father, Mother, Sister. Cancer Sister. Heart Disease Father, Mother. Respiratory Condition Sister. Thyroid problems Sister.  Pregnancy / Birth History Sabino Gasser, [place]; 03/18/2018 10:07 AM) Durenda Age 4 Maternal age 57-20 Para 2  Other Problems Sabino Gasser, [place]; 03/18/2018 10:07 AM) Chronic Renal Failure Syndrome Gastroesophageal Reflux Disease High blood pressure Thyroid Disease     Review of Systems Sabino Gasser CMA; 03/18/2018 10:07 AM) General Present- Appetite Loss. Not Present- Chills, Fatigue, Fever, Night Sweats, Weight Gain and Weight Loss. Skin Not Present- Change in Wart/Mole, Dryness, Hives, Jaundice, New Lesions, Non-Healing Wounds, Rash and Ulcer. HEENT Present- Wears glasses/contact lenses. Not Present- Earache, Hearing Loss, Hoarseness, Nose Bleed, Oral Ulcers, Ringing in the Ears, Seasonal Allergies, Sinus Pain, Sore Throat, Visual Disturbances and Yellow Eyes. Respiratory Present- Difficulty Breathing. Not Present- Bloody sputum, Chronic Cough, Snoring and Wheezing. Cardiovascular Present-  Shortness of Breath. Not Present- Chest Pain, Difficulty Breathing Lying Down, Leg Cramps, Palpitations, Rapid Heart Rate and Swelling of Extremities. Gastrointestinal Present- Abdominal Pain, Chronic diarrhea and Gets full quickly at meals. Not Present- Bloating, Bloody Stool, Change in Bowel Habits, Constipation, Difficulty Swallowing, Excessive gas, Hemorrhoids, Indigestion, Nausea, Rectal Pain and Vomiting. Female Genitourinary Not Present- Frequency, Nocturia, Painful Urination, Pelvic Pain and Urgency. Neurological Not Present- Decreased Memory, Fainting, Headaches, Numbness, Seizures, Tingling, Tremor, Trouble walking and Weakness. Psychiatric Not Present- Anxiety, Bipolar, Change in Sleep Pattern, Depression, Fearful and Frequent crying. Hematology Not Present- Blood Thinners, Easy Bruising, Excessive  bleeding, Gland problems, HIV and Persistent Infections.  Vitals Sabino Gasser CMA; 03/18/2018 10:10 AM) 03/18/2018 10:09 AM Weight: 147.38 lb Height: 59in Body Surface Area: 1.62 m Body Mass Index: 29.77 kg/m  Temp.: 97.58F(Oral)  Pulse: 65 (Regular)  BP: 132/84 (Sitting, Left Arm, Standard)      Physical Exam Adin Hector MD; 03/18/2018 10:43 AM)  General Mental Status-Alert. General Appearance-Not in acute distress, Not Sickly. Orientation-Oriented X3. Hydration-Well hydrated. Voice-Normal.  Integumentary Global Assessment Upon inspection and palpation of skin surfaces of the - Axillae: non-tender, no inflammation or ulceration, no drainage. and Distribution of scalp and body hair is normal. General Characteristics Temperature - normal warmth is noted.  Head and Neck Head-normocephalic, atraumatic with no lesions or palpable masses. Face Global Assessment - atraumatic, no absence of expression. Neck Global Assessment - no abnormal movements, no bruit auscultated on the right, no bruit auscultated on the left, no decreased range of motion,  non-tender. Trachea-midline. Thyroid Gland Characteristics - non-tender.  Eye Eyeball - Left-Extraocular movements intact, No Nystagmus. Eyeball - Right-Extraocular movements intact, No Nystagmus. Cornea - Left-No Hazy. Cornea - Right-No Hazy. Sclera/Conjunctiva - Left-No scleral icterus, No Discharge. Sclera/Conjunctiva - Right-No scleral icterus, No Discharge. Pupil - Left-Direct reaction to light normal. Pupil - Right-Direct reaction to light normal.  ENMT Ears Pinna - Left - no drainage observed, no generalized tenderness observed. Right - no drainage observed, no generalized tenderness observed. Nose and Sinuses External Inspection of the Nose - no destructive lesion observed. Inspection of the nares - Left - quiet respiration. Right - quiet respiration. Mouth and Throat Lips - Upper Lip - no fissures observed, no pallor noted. Lower Lip - no fissures observed, no pallor noted. Nasopharynx - no discharge present. Oral Cavity/Oropharynx - Tongue - no dryness observed. Oral Mucosa - no cyanosis observed. Hypopharynx - no evidence of airway distress observed.  Chest and Lung Exam Inspection Movements - Normal and Symmetrical. Accessory muscles - No use of accessory muscles in breathing. Palpation Palpation of the chest reveals - Non-tender. Auscultation Breath sounds - Normal and Clear.  Cardiovascular Auscultation Rhythm - Regular. Murmurs & Other Heart Sounds - Auscultation of the heart reveals - No Murmurs and No Systolic Clicks.  Abdomen Inspection Inspection of the abdomen reveals - No Visible peristalsis and No Abnormal pulsations. Umbilicus - No Bleeding, No Urine drainage. Palpation/Percussion Palpation and Percussion of the abdomen reveal - Soft, Non Tender, No Rebound tenderness, No Rigidity (guarding) and No Cutaneous hyperesthesia. Note: Abdomen overweight but soft. Mild central and left-sided abdominal discomfort but no guarding. Improved when  she was in the hospital. No distasis recti. No umbilical or other anterior abdominal wall hernias  Female Genitourinary Sexual Maturity Tanner 5 - Adult hair pattern. Note: No vaginal bleeding nor discharge  Peripheral Vascular Upper Extremity Inspection - Left - No Cyanotic nailbeds, Not Ischemic. Right - No Cyanotic nailbeds, Not Ischemic.  Neurologic Neurologic evaluation reveals -normal attention span and ability to concentrate, able to name objects and repeat phrases. Appropriate fund of knowledge , normal sensation and normal coordination. Mental Status Affect - not angry, not paranoid. Cranial Nerves-Normal Bilaterally. Gait-Normal.  Neuropsychiatric Mental status exam performed with findings of-able to articulate well with normal speech/language, rate, volume and coherence, thought content normal with ability to perform basic computations and apply abstract reasoning and no evidence of hallucinations, delusions, obsessions or homicidal/suicidal ideation.  Musculoskeletal Global Assessment Spine, Ribs and Pelvis - no instability, subluxation or laxity. Right Upper Extremity - no instability, subluxation  or laxity.  Lymphatic Head & Neck  General Head & Neck Lymphatics: Bilateral - Description - No Localized lymphadenopathy. Axillary  General Axillary Region: Bilateral - Description - No Localized lymphadenopathy. Femoral & Inguinal  Generalized Femoral & Inguinal Lymphatics: Left - Description - No Localized lymphadenopathy. Right - Description - No Localized lymphadenopathy.    Assessment & Plan Adin Hector MD; 03/18/2018 10:42 AM)  DIVERTICULITIS OF INTESTINE WITH ABSCESS (K57.80) Impression: Persistent diverticulitis with a second hospitalization this year. Now with a chronic but small abscess. Still with some symptoms.  Continue Augmentin. Try to follow after completing course later this week  Slowly and fiber back into her bowel regimen to help  thicken up her stools. She normally is on MiraLAX. She can adjust that to see if that helps her to try her raisin bran again. Ease back into it  Suspect she's had more frequent bowel movements on the Augmentin. We'll get stool studies to rule out C. difficile.  Consider repeat CT scan if symptoms worsen or do not improve in the next few weeks  I suspect she's reaching end-stage diverticulitis and will not improve until she gets colectomy done at some point. Ideally we would wait another 6-8 weeks to allow inflammation to be minimal, abscess to possibly resolve. Make it more likely that we can do a minimally invasive approach. Robotic resection with anastomosis. Try to avoid a colostomy.  She's been cleared by cardiology.  She hernia and a colonoscopy in the summer which showed diverticulosis but no other mass or tumor.  She is followed by Dr. Justin Mend with nephrology. Chronic kidney disease significant but stable.  While she is increased operative risks with her numerous health issues, I think we can minimize it if we can allow this attack to be is minimal possible and get her to a stronger place before doing elective surgery. Plan early January. Like with the patient and her daughter. Questions answered. They agreed to proceed.   PREOP COLON - ENCOUNTER FOR PREOPERATIVE EXAMINATION FOR GENERAL SURGICAL PROCEDURE (Z01.818)  Current Plans You are being scheduled for surgery- Our schedulers will call you.  You should hear from our office's scheduling department within 5 working days about the location, date, and time of surgery. We try to make accommodations for patient's preferences in scheduling surgery, but sometimes the OR schedule or the surgeon's schedule prevents [name] from making those accommodations.  If you have not heard from our office [phone]) in 5 working days, call the office and ask for your surgeon's nurse.  If you have other questions about your diagnosis, plan, or  surgery, call the office and ask for your surgeon's nurse.  Written instructions provided The anatomy & physiology of the digestive tract was discussed. The pathophysiology of the colon was discussed. Natural history risks without surgery was discussed. I feel the risks of no intervention will lead to serious problems that outweigh the operative risks; therefore, I recommended a partial colectomy to remove the pathology. Minimally invasive (Robotic/Laparoscopic) & open techniques were discussed.  Risks such as bleeding, infection, abscess, leak, reoperation, possible ostomy, hernia, heart attack, death, and other risks were discussed. I noted a good likelihood this will help address the problem. Goals of post-operative recovery were discussed as well. Need for adequate nutrition, daily bowel regimen and healthy physical activity, to optimize recovery was noted as well. We will work to minimize complications. Educational materials were available as well. Questions were answered. The patient expresses understanding & wishes to proceed  with surgery.  Pt Education - [facility] Colon Bowel Prep 2018 ERAS/Miralax/Antibiotics Started Neomycin Sulfate 500 MG Oral Tablet, 2 (two) Tablet SEE NOTE, #6, 03/18/2018, No Refill. Local Order: TAKE TWO TABLETS AT 2 PM, 3 PM, AND 10 PM THE DAY PRIOR TO SURGERY Pt Education - Pamphlet Given - Laparoscopic Colorectal Surgery: discussed with patient and provided information. Pt Education - [facility] Colectomy post-op instructions: discussed with patient and provided information.  Adin Hector, MD, FACS, MASCRS Gastrointestinal and Minimally Invasive Surgery    [address], [place], [place] [address] [phone] Main / Paging [phone] Fax

## 2018-03-19 ENCOUNTER — Ambulatory Visit (INDEPENDENT_AMBULATORY_CARE_PROVIDER_SITE_OTHER): Payer: Medicare Other | Admitting: Family Medicine

## 2018-03-19 ENCOUNTER — Encounter: Payer: Self-pay | Admitting: Family Medicine

## 2018-03-19 VITALS — BP 132/60 | HR 61 | Temp 97.9°F | Ht 59.0 in | Wt 147.3 lb

## 2018-03-19 DIAGNOSIS — D631 Anemia in chronic kidney disease: Secondary | ICD-10-CM | POA: Diagnosis not present

## 2018-03-19 DIAGNOSIS — E876 Hypokalemia: Secondary | ICD-10-CM | POA: Diagnosis not present

## 2018-03-19 DIAGNOSIS — R197 Diarrhea, unspecified: Secondary | ICD-10-CM | POA: Diagnosis not present

## 2018-03-19 DIAGNOSIS — N184 Chronic kidney disease, stage 4 (severe): Secondary | ICD-10-CM

## 2018-03-19 DIAGNOSIS — N189 Chronic kidney disease, unspecified: Secondary | ICD-10-CM

## 2018-03-19 DIAGNOSIS — K5720 Diverticulitis of large intestine with perforation and abscess without bleeding: Secondary | ICD-10-CM

## 2018-03-19 NOTE — Patient Instructions (Addendum)
BEFORE YOU LEAVE: -lab -follow up: in 3 months on same day as AWV that is already scheduled  Complete the stool test with the surgeon.  We have ordered labs or studies at this visit. It can take up to 1-2 weeks for results and processing. IF results require follow up or explanation, we will call you with instructions. Clinically stable results will be released to your [facility]. If you have not heard from [name] or cannot find your results in [facility] in 2 weeks please contact our office at [phone].  If you are not yet signed up for [facility], please consider signing up.  I hope you are feeling better soon! Seek care promptly if your symptoms worsen, new concerns arise or you are not improving with treatment.

## 2018-03-19 NOTE — Progress Notes (Signed)
HPI:  Using dictation device. Unfortunately this device frequently misinterprets words/phrases.  Novia Lansberry is a pleasant 72 y.o. with a PMH significant for a complicated past medical history listed below here for a hospital follow up. See transitional care phone note in Epic. Per review of discharge documents and patient: Hospitalized 10/23 to 03/06/2018 Primary admitting complaint(s): Abdominal pain, chills, loose stools Primary admitting diagnosis (es) and treatment: Diverticulitis with abscess, treated with antibiotics and outpatient surgical follow-up.   She saw the general surgeon yesterday. Other significant diagnosis (es) and treatment: Course complicated by acute on chronic kidney disease, nephrology consulted and following.  She was treated with IV Lasix in the hospital with good diuresis.  She is followed by cardiology and nephrology.  She had some electrolyte imbalances that were repleted in the hospital and at home potassium regimen was started.  She also was given bicarb for metabolic acidosis.  She is now off of oral anticoagulation for her paroxysmal atrial flutter secondary to GI bleeding and gastric ulcer, followed by cardiology.  She was given nystatin for thrush. Follow up concerns per discharge document: Slightly recommended a recheck on her BMP.  Actually, her potassium was low on discharge at 2.9.  Creatinine was 2.55. Reports today: Doing okay.  Reports abdominal pain has resolved.  She continues to have multiple episodes of diarrhea daily.  Reports she is doing a stool test with her surgeon to check for C. difficile colitis.  She has the container to complete the test with.  Reports has follow-up with her kidney specialist.  Taking metronidazole and Cipro daily.  Taking potassium. Denies: Fevers, abdominal pain, melena, hematochezia, vomiting, worsening or severe depression She refused a flu shot and also a depression screening today.  She reports she is coping okay and  just plans to try to get through the surgery and is hoping to do better after that.   ROS: See pertinent positives and negatives per HPI.  Past Medical History:  Diagnosis Date  . A-fib ([place])    on Eliquis  . AICD (automatic cardioverter/defibrillator) present 10/06/2015  . Anemia   . Arthritis    "qwhere" (01/03/2018)  . Asthma    reports mild asthma since childhood - had COPD on dx list from prior PCP  . Chronic bronchitis ([place])    "get it most years; not this past year though" (01/03/2018)  . Chronic kidney disease    "? stage;  followed by [facility]; [name]" (01/03/2018)  . Chronic systolic congestive heart failure ([place])   . DEPRESSION 12/17/2009   Annotation: PHQ-9 score = 14 done on 12/17/2009 Qualifier: Diagnosis of  By: Hassell Done FNP, Tori Milks    . Diverticulosis   . FIBROIDS, UTERUS 03/05/2008  . Glaucoma   . Hepatitis C    HEPATITIS C - s/p treatment with Harvoni, saw hepatology, Dawn Drazek  . History of blood transfusion ~ 11/2017  . Hyperlipemia 12/06/2012  . HYPERTENSION, BENIGN 04/24/2007  . Hypothyroidism   . LBBB (left bundle branch block)   . Lower GI bleeding    "been dealing w/it since 07/2017" (01/03/2018)  . Nonischemic cardiomyopathy ([place])   . OBESITY 05/27/2009  . OBSTRUCTIVE SLEEP APNEA 11/14/2007   no CPAP  . OSTEOPENIA 09/30/2008  . Personal history of other infectious and parasitic disease    Hepatitis B  . Pneumonia    "several times" (01/03/2018)    Past Surgical History:  Procedure Laterality Date  . APPENDECTOMY    . CARDIAC CATHETERIZATION    .  CARDIOVERSION N/A 12/14/2014   Procedure: CARDIOVERSION;  Surgeon: Dorothy Spark, MD;  Location: [facility] ENDOSCOPY;  Service: Cardiovascular;  Laterality: N/A;  . CARDIOVERSION N/A 02/26/2017   Procedure: CARDIOVERSION;  Surgeon: Evans Lance, MD;  Location: [place] CV LAB;  Service: Cardiovascular;  Laterality: N/A;  . CARDIOVERSION N/A 07/24/2017   Procedure: CARDIOVERSION;  Surgeon:  Thayer Headings, MD;  Location: [facility] ENDOSCOPY;  Service: Cardiovascular;  Laterality: N/A;  . COLONOSCOPY N/A 11/08/2017   Procedure: COLONOSCOPY;  Surgeon: Milus Banister, MD;  Location: [facility] ENDOSCOPY;  Service: Endoscopy;  Laterality: N/A;  . DILATION AND CURETTAGE OF UTERUS    . EP IMPLANTABLE DEVICE N/A 10/06/2015   Procedure: BIV ICD Generator Changeout;  Surgeon: [name], MD;  Location: [place] [facility] LAB;  Service: Cardiovascular;  Laterality: N/A;  . ESOPHAGOGASTRODUODENOSCOPY N/A 10/26/2017   Procedure: ESOPHAGOGASTRODUODENOSCOPY (EGD);  Surgeon: Ladene Artist, MD;  Location: [facility] ENDOSCOPY;  Service: Endoscopy;  Laterality: N/A;  . ESOPHAGOGASTRODUODENOSCOPY (EGD) WITH PROPOFOL N/A 11/07/2017   Procedure: ESOPHAGOGASTRODUODENOSCOPY (EGD) WITH PROPOFOL;  Surgeon: Milus Banister, MD;  Location: [facility] ENDOSCOPY;  Service: Endoscopy;  Laterality: N/A;  . HOT HEMOSTASIS N/A 10/26/2017   Procedure: HOT HEMOSTASIS (ARGON PLASMA COAGULATION/BICAP);  Surgeon: Ladene Artist, MD;  Location: [facility] ENDOSCOPY;  Service: Endoscopy;  Laterality: N/A;  . INSERT / REPLACE / REMOVE PACEMAKER  ?2008  . POLYPECTOMY  11/08/2017   Procedure: POLYPECTOMY;  Surgeon: Milus Banister, MD;  Location: [facility] ENDOSCOPY;  Service: Endoscopy;;  . RIGHT HEART CATH N/A 02/13/2018   Procedure: RIGHT HEART CATH;  Surgeon: [name], MD;  Location: [place] [facility] LAB;  Service: Cardiovascular;  Laterality: N/A;  . TUBAL LIGATION      Family History  Problem Relation Age of Onset  . Asthma Father   . Heart attack Father   . Asthma Sister   . Lung cancer Sister   . Heart attack Mother   . Stroke Brother   . Colon cancer Neg Hx   . Esophageal cancer Neg Hx   . Pancreatic cancer Neg Hx   . Stomach cancer Neg Hx   . Liver disease Neg Hx     SOCIAL HX: See HPI   Current Outpatient Medications:  .  albuterol (PROVENTIL HFA;VENTOLIN HFA) 108 (90 Base) MCG/ACT inhaler, Inhale 2 puffs into the lungs every 6  (six) hours as needed for wheezing or shortness of breath., Disp: 1 Inhaler, Rfl: 3 .  allopurinol (ZYLOPRIM) 100 MG tablet, Take 1/2 tablet by mouth daily (Patient taking differently: Take 100 mg by mouth daily. ), Disp: 15 tablet, Rfl: 1 .  alum & mag hydroxide-simeth (MAALOX/MYLANTA) 127-517-00 MG/5ML suspension, Take 30 mLs by mouth every 6 (six) hours as needed for indigestion, heartburn or flatulence., Disp: 355 mL, Rfl: 0 .  amiodarone (PACERONE) 200 MG tablet, Take 200 mg by mouth at bedtime. , Disp: , Rfl:  .  BIDIL 20-37.5 MG tablet, TAKE 1 TABLET BY MOUTH TWICE DAILY (Patient taking differently: Take 1 tablet by mouth 2 (two) times daily. ), Disp: 180 tablet, Rfl: 3 .  bisacodyl (DULCOLAX) 10 MG suppository, Place 1 suppository (10 mg total) rectally as needed for moderate constipation., Disp: 12 suppository, Rfl: 0 .  carvedilol (COREG) 25 MG tablet, Take 25 mg by mouth 2 (two) times daily., Disp: , Rfl: 3 .  ciprofloxacin (CIPRO) 500 MG tablet, Take 1 tablet (500 mg total) by mouth 2 (two) times daily for 14 days.,  Disp: 28 tablet, Rfl: 0 .  colchicine 0.6 MG tablet, Take 0.6 mg by mouth daily as needed (gout flares). , Disp: , Rfl: 0 .  cyanocobalamin (,VITAMIN B-12,) 1000 MCG/ML injection, Inject 1,000 mcg into the muscle every 30 (thirty) days., Disp: , Rfl:  .  dicyclomine (BENTYL) 10 MG capsule, Take 1 capsule (10 mg total) by mouth 3 (three) times daily before meals., Disp: 30 capsule, Rfl: 0 .  hydrocortisone (ANUSOL-HC) 2.5 % rectal cream, Apply 1 application topically 4 (four) times daily as needed for hemorrhoids., Disp: 30 g, Rfl: 0 .  levothyroxine (SYNTHROID, LEVOTHROID) 125 MCG tablet, Take 125 mcg by mouth daily before breakfast., Disp: , Rfl:  .  menthol-cetylpyridinium (CEPACOL) 3 MG lozenge, Take 1 lozenge (3 mg total) by mouth as needed for sore throat (throat irritation / pain)., Disp: 100 tablet, Rfl: 12 .  metroNIDAZOLE (FLAGYL) 500 MG tablet, Take 1 tablet (500 mg  total) by mouth 3 (three) times daily for 14 days., Disp: 42 tablet, Rfl: 0 .  nitroGLYCERIN (NITROSTAT) 0.4 MG SL tablet, Place 0.4 mg under the tongue every 5 (five) minutes as needed for chest pain., Disp: , Rfl:  .  nystatin (MYCOSTATIN) 100000 UNIT/ML suspension, Take 5 mLs (500,000 Units total) by mouth 4 (four) times daily., Disp: 60 mL, Rfl: 0 .  oxyCODONE (OXY IR/ROXICODONE) 5 MG immediate release tablet, Take 1 tablet (5 mg total) by mouth every 6 (six) hours as needed for moderate pain., Disp: 20 tablet, Rfl: 0 .  pantoprazole (PROTONIX) 40 MG tablet, Take 1 tablet (40 mg total) by mouth daily., Disp: 30 tablet, Rfl: 4 .  polyethylene glycol (MIRALAX / GLYCOLAX) packet, Take 17 g by mouth 2 (two) times daily. (Patient taking differently: Take 17 g by mouth daily as needed for mild constipation. ), Disp: 60 each, Rfl: 2 .  potassium chloride 20 MEQ TBCR, Take 20 mEq by mouth daily., Disp: 30 tablet, Rfl: 3 .  potassium chloride 20 MEQ TBCR, Take 20 mEq by mouth daily. Do not take if Demedex is stopped, Disp: 30 tablet, Rfl: 0 .  saccharomyces boulardii (FLORASTOR) 250 MG capsule, Take 1 capsule (250 mg total) by mouth 2 (two) times daily., Disp: 30 capsule, Rfl: 0 .  sertraline (ZOLOFT) 50 MG tablet, Take 1.5 tablets (75 mg total) by mouth daily., Disp: 135 tablet, Rfl: 3 .  sodium bicarbonate 650 MG tablet, Take 1 tablet (650 mg total) by mouth 2 (two) times daily., Disp: 60 tablet, Rfl: 0 .  torsemide (DEMADEX) 20 MG tablet, Take 1 tablet (20 mg total) by mouth daily., Disp: 30 tablet, Rfl: 11  EXAM:  Vitals:   03/19/18 1604  BP: 132/60  Pulse: 61  Temp: 97.9 F (36.6 C)  SpO2: 96%    Body mass index is 29.75 kg/m.  GENERAL: vitals reviewed and listed above, alert, oriented, appears well hydrated and in no acute distress  HEENT: atraumatic, conjunttiva clear, no obvious abnormalities on inspection of external nose and ears  NECK: no obvious masses on inspection  LUNGS:  clear to auscultation bilaterally, no wheezes, rales or rhonchi, good air movement  CV: HRRR, no peripheral edema  MS: moves all extremities without noticeable abnormality  ABD: Bowel sounds positive, soft, nontender to palpation  PSYCH: pleasant and cooperative, no obvious depression or anxiety  ASSESSMENT AND PLAN:  Discussed the following assessment and plan:  Diverticulitis of large intestine with abscess without bleeding  Anemia in chronic kidney disease, unspecified CKD stage  CKD (chronic kidney disease) stage 4, GFR 15-29 ml/min (HCC) - Plan: Basic metabolic panel  Diarrhea, unspecified type  Hypokalemia - Plan: Basic metabolic panel  -Advised to check a BMP today, she agreed, she declined a hemoglobin A1c check. -She declined a flu shot today -She declined PHQ 9 screening for depression today, reports she is coping fine despite circumstances is hopeful to get to the surgery and feel better.  Offered counseling, she declined. -She reports she will do the C. difficile screening with her surgeon's office -Follow up here in February, otherwise as needed -Patient advised to return or notify a doctor immediately if symptoms worsen or persist or new concerns arise.  Patient Instructions  BEFORE YOU LEAVE: -lab -follow up: in 3 months on same day as AWV that is already scheduled  Complete the stool test with the surgeon.  We have ordered labs or studies at this visit. It can take up to 1-2 weeks for results and processing. IF results require follow up or explanation, we will call you with instructions. Clinically stable results will be released to your [facility]. If you have not heard from [name] or cannot find your results in [facility] in 2 weeks please contact our office at [phone].  If you are not yet signed up for [facility], please consider signing up.  I hope you are feeling better soon! Seek care promptly if your symptoms worsen, new concerns arise or you are not improving  with treatment.           [name], DO

## 2018-03-20 ENCOUNTER — Other Ambulatory Visit: Payer: Self-pay | Admitting: Surgery

## 2018-03-20 DIAGNOSIS — K5780 Diverticulitis of intestine, part unspecified, with perforation and abscess without bleeding: Secondary | ICD-10-CM

## 2018-03-20 LAB — BASIC METABOLIC PANEL
BUN: 28 mg/dL — ABNORMAL HIGH (ref 6–23)
CHLORIDE: 106 meq/L (ref 96–112)
CO2: 26 mEq/L (ref 19–32)
CREATININE: 2.3 mg/dL — AB (ref 0.40–1.20)
Calcium: 8.8 mg/dL (ref 8.4–10.5)
GFR: 26.95 mL/min — ABNORMAL LOW (ref >60.00)
Glucose, Bld: 114 mg/dL — ABNORMAL HIGH (ref 70–99)
Potassium: 3.9 mEq/L (ref 3.5–5.1)
SODIUM: 141 meq/L (ref 135–145)

## 2018-03-22 ENCOUNTER — Inpatient Hospital Stay: Payer: Medicare Other

## 2018-03-22 DIAGNOSIS — N189 Chronic kidney disease, unspecified: Secondary | ICD-10-CM | POA: Diagnosis not present

## 2018-03-22 DIAGNOSIS — D649 Anemia, unspecified: Secondary | ICD-10-CM

## 2018-03-22 DIAGNOSIS — K529 Noninfective gastroenteritis and colitis, unspecified: Secondary | ICD-10-CM | POA: Diagnosis not present

## 2018-03-22 DIAGNOSIS — Z79899 Other long term (current) drug therapy: Secondary | ICD-10-CM | POA: Diagnosis not present

## 2018-03-22 DIAGNOSIS — R197 Diarrhea, unspecified: Secondary | ICD-10-CM | POA: Diagnosis not present

## 2018-03-22 DIAGNOSIS — K7460 Unspecified cirrhosis of liver: Secondary | ICD-10-CM | POA: Diagnosis not present

## 2018-03-22 DIAGNOSIS — K5792 Diverticulitis of intestine, part unspecified, without perforation or abscess without bleeding: Secondary | ICD-10-CM | POA: Diagnosis not present

## 2018-03-22 DIAGNOSIS — I129 Hypertensive chronic kidney disease with stage 1 through stage 4 chronic kidney disease, or unspecified chronic kidney disease: Secondary | ICD-10-CM | POA: Diagnosis not present

## 2018-03-22 DIAGNOSIS — D509 Iron deficiency anemia, unspecified: Secondary | ICD-10-CM | POA: Diagnosis not present

## 2018-03-22 DIAGNOSIS — J90 Pleural effusion, not elsewhere classified: Secondary | ICD-10-CM | POA: Diagnosis not present

## 2018-03-22 DIAGNOSIS — I700 Atherosclerosis of aorta: Secondary | ICD-10-CM | POA: Diagnosis not present

## 2018-03-22 DIAGNOSIS — D631 Anemia in chronic kidney disease: Secondary | ICD-10-CM | POA: Diagnosis not present

## 2018-03-22 DIAGNOSIS — K5732 Diverticulitis of large intestine without perforation or abscess without bleeding: Secondary | ICD-10-CM | POA: Diagnosis not present

## 2018-03-22 LAB — CBC WITH DIFFERENTIAL (CANCER CENTER ONLY)
ABS IMMATURE GRANULOCYTES: 0.05 10*3/uL (ref 0.00–0.07)
BASOS ABS: 0.1 10*3/uL (ref 0.0–0.1)
Basophils Relative: 1 %
Eosinophils Absolute: 0.3 10*3/uL (ref 0.0–0.5)
Eosinophils Relative: 4 %
HCT: 37.3 % (ref 36.0–46.0)
Hemoglobin: 12 g/dL (ref 12.0–15.0)
IMMATURE GRANULOCYTES: 1 %
LYMPHS ABS: 0.8 10*3/uL (ref 0.7–4.0)
Lymphocytes Relative: 13 %
MCH: 26.8 pg (ref 26.0–34.0)
MCHC: 32.2 g/dL (ref 30.0–36.0)
MCV: 83.3 fL (ref 80.0–100.0)
MONOS PCT: 8 %
Monocytes Absolute: 0.5 10*3/uL (ref 0.1–1.0)
NEUTROS ABS: 4.6 10*3/uL (ref 1.7–7.7)
NEUTROS PCT: 73 %
PLATELETS: 217 10*3/uL (ref 150–400)
RBC: 4.48 MIL/uL (ref 3.87–5.11)
RDW: 19.9 % — ABNORMAL HIGH (ref 11.5–15.5)
WBC Count: 6.3 10*3/uL (ref 4.0–10.5)
nRBC: 0 % (ref 0.0–0.2)

## 2018-03-22 MED ORDER — DARBEPOETIN ALFA 300 MCG/0.6ML IJ SOSY
PREFILLED_SYRINGE | INTRAMUSCULAR | Status: AC
  Filled 2018-03-22: qty 0.6

## 2018-03-22 NOTE — Progress Notes (Signed)
Pt here for Aranesp injection.  Pt hemoglobin 12.0, per treatment plan parameters pt will not receive injection today.

## 2018-03-25 ENCOUNTER — Ambulatory Visit (INDEPENDENT_AMBULATORY_CARE_PROVIDER_SITE_OTHER): Payer: Medicare Other

## 2018-03-25 DIAGNOSIS — Z95810 Presence of automatic (implantable) cardiac defibrillator: Secondary | ICD-10-CM

## 2018-03-25 DIAGNOSIS — I5032 Chronic diastolic (congestive) heart failure: Secondary | ICD-10-CM | POA: Diagnosis not present

## 2018-03-25 NOTE — Progress Notes (Signed)
EPIC Encounter for ICM Monitoring  Patient Name: Morgan Clay is a 72 y.o. female Date: 03/25/2018 Primary Care Physican: [name], DO Primary [place] Electrophysiologist: Caryl Comes Nephrologist: [name] Bi-V Pacing: 100%     Last Weight: 149lbs       Heart Failure questions reviewed, pt asymptomatic.   Thoracic impedance abnormal suggesting fluid accumulation starting 03/02/2018 following hospitalization 02/27/2018 - 03/06/2018.   Prescribed: Furosemide 20 mgtake 1tablet(20 mg total)daily.   Labs: 03/19/2018 Creatinine 2.30, BUN 28, Potassium 3.9, Sodium 141 03/06/2018 Creatinine 2.55, BUN 30, Potassium 2.9, Sodium 136, eGFR 18-21  03/05/2018 Creatinine 3.52, BUN 31, Potassium 4.1, Sodium 130, eGFR 12-14  03/04/2018 Creatinine 3.64, BUN 31, Potassium 3.7, Sodium 132, eGFR 12-14  03/03/2018 Creatinine 3.16, BUN 33, Potassium 3.3, Sodium 136, eGFR 14-16  03/02/2018 Creatinine 2.50, BUN 31, Potassium 3.6, Sodium 141, eGFR 18-21  03/01/2018 Creatinine 2.15, BUN 34, Potassium 3.6, Sodium 136, eGFR 22-26  02/28/2018 Creatinine 2.09, BUN 35, Potassium 3.5, Sodium 140, eGFR 23-26  02/27/2018 Creatinine 2.26, BUN 38, Potassium 5.9, Sodium 136, eGFR 21-24  01/10/2018 Creatinine2.50, BUN43, Potassium3.8, Sodium139 01/05/2018 Creatinine2.83, BUN70, Potassium4.2, [id number], [id number]  01/04/2018 Creatinine2.84, BUN88, Potassium3.9, [id number], [id number]  01/03/2018 Creatinine3.16, BUN96, Potassium4.5, [id number], VKPQ24-49  12/31/2017 Creatinine3.48, BUN99, Potassium4.8, [id number], [id number]  12/24/2017 Creatinine3.36, BUN72, Potassium4.0, POLIDC301, [id number]  A complete set of results can be found in Results Review.  Recommendations: No changes.  Advised Dr [name] will provide recommendations at tomorrow's appointment if needed.  Follow-up plan: ICM clinic phone appointment on 04/09/2018 to recheck fluid levels.  Office  appointment scheduled 03/26/2018 with Dr. [name].    Copy of ICM check sent to Dr. Caryl Comes and fluid levels will be checked at the appointment with Dr [name] tomorrow.   3 month ICM trend: 03/25/2018    1 Year ICM trend:       [name], RN 03/25/2018 2:50 PM

## 2018-03-26 ENCOUNTER — Inpatient Hospital Stay: Payer: Medicare Other

## 2018-03-26 ENCOUNTER — Encounter (HOSPITAL_COMMUNITY): Payer: Self-pay | Admitting: Cardiology

## 2018-03-26 ENCOUNTER — Ambulatory Visit (HOSPITAL_COMMUNITY)
Admission: RE | Admit: 2018-03-26 | Discharge: 2018-03-26 | Disposition: A | Discharge status: Home / Self Care | Payer: Medicare Other | Source: Ambulatory Visit | Attending: Cardiology | Admitting: Cardiology

## 2018-03-26 ENCOUNTER — Inpatient Hospital Stay: Payer: Medicare Other | Admitting: Oncology

## 2018-03-26 VITALS — BP 163/53 | HR 60 | Wt 141.6 lb

## 2018-03-26 DIAGNOSIS — J449 Chronic obstructive pulmonary disease, unspecified: Secondary | ICD-10-CM | POA: Diagnosis not present

## 2018-03-26 DIAGNOSIS — I428 Other cardiomyopathies: Secondary | ICD-10-CM | POA: Insufficient documentation

## 2018-03-26 DIAGNOSIS — E039 Hypothyroidism, unspecified: Secondary | ICD-10-CM | POA: Insufficient documentation

## 2018-03-26 DIAGNOSIS — N184 Chronic kidney disease, stage 4 (severe): Secondary | ICD-10-CM | POA: Diagnosis not present

## 2018-03-26 DIAGNOSIS — Z95810 Presence of automatic (implantable) cardiac defibrillator: Secondary | ICD-10-CM | POA: Insufficient documentation

## 2018-03-26 DIAGNOSIS — I10 Essential (primary) hypertension: Secondary | ICD-10-CM | POA: Diagnosis not present

## 2018-03-26 DIAGNOSIS — I130 Hypertensive heart and chronic kidney disease with heart failure and stage 1 through stage 4 chronic kidney disease, or unspecified chronic kidney disease: Secondary | ICD-10-CM | POA: Insufficient documentation

## 2018-03-26 DIAGNOSIS — Z79899 Other long term (current) drug therapy: Secondary | ICD-10-CM | POA: Diagnosis not present

## 2018-03-26 DIAGNOSIS — I5032 Chronic diastolic (congestive) heart failure: Secondary | ICD-10-CM | POA: Diagnosis not present

## 2018-03-26 DIAGNOSIS — E1122 Type 2 diabetes mellitus with diabetic chronic kidney disease: Secondary | ICD-10-CM | POA: Insufficient documentation

## 2018-03-26 MED ORDER — TORSEMIDE 20 MG PO TABS: 20.0000 mg | ORAL_TABLET | ORAL | 0 refills | Status: DC | PRN

## 2018-03-26 NOTE — Progress Notes (Signed)
PCP: Dr. [name] Cardiology: Dr. Caryl Comes HF Cardiology: Dr. [name]  72 yo with history of nonischemic cardiomyopathy with recovery of EF with BiV pacing, recurrent GI bleeding, and paroxysmal atrial flutter was referred by Dr. Caryl Comes for evaluation of CHF.  Patient has a history of presumed nonischemic cardiomyopathy for a number of years.  Echo in 12/15 showed EF 25-30% and Cardiolite at that time showed no ischemia.  She had Medtronic CRT-D system placed, and EF recovered. In 4/19, echo showed EF 60-65%.  Despite recovery of LV systolic function, she has remained short of breath with exertion.  This has been worse for a number of months now.  She has gained 10 lbs recently.  She has orthopnea, sleeping on 4 pillows. She is short of breath walking short distances around her house.  She has abdominal swelling.  Occasional PND. Occasional lightheadedness if she stands too fast.  Her chest will feel heavy with exertion (when she gets short of breath).  No syncope.  I checked her CRT-D device today.  She is BiV pacing appropriately (>99% of the time) but has volume overload by impedance monitoring.   She has additionally had problems with GI bleeding.  She had been on Eliquis for atrial flutter but this was stopped with recurrent GI bleeding.  She had bleeding from a gastric ulcer but was also noted to have multiple small bowel AVMs on capsule endoscopy out of reach of endoscope.  Eliquis was stopped. She has had no melena or BRBPR off Eliquis.   Today she returns for HF follow up. Overall feeling fine. Denies SOB/PND/Orthopnea. Appetite poor.  No fever or chills. Weight at home has been trending down to 140 pounds. Taking all medications but she did not take BP medications today.  No bleeding issues.   ECG (7/19): NSR with BiV pacing (personally reviewed)  Labs (9/19):  Hgb 10.2, K 3.8, creatinine 2.5 Labs (02/26/2018): K 3.4 Creatinine 2.2 WBC 76.   PMH: 1. Diabetes: diet-controlled.  2. Gout 3. HCV:  Treated with Harvoni.  4. Atrial flutter: Atypical.  Had DCCV in 3/19, maintained on amiodarone.  5. HTN 6. CKD stage 3: Sees nephrology, [name].  7. H/o SBO 8. Obesity 9. Nonischemic cardiomyopathy: Echo in 12/15 with EF 25-30%.  Cardiolite in 12/15 with no ischemia.  She had a Medtronic CRT-D device placed with recovery of EF.   - Echo (4/19): EF 60-65% with moderate AI, moderate MR, and PASP 49 mmHg.  - RHC 02/13/2018 RA mean 6, RV 49/7,PA 50/18, mean 31,PCWP mean 20, Oxygen saturations:PA 67% AO 97% Cardiac Output (Fick) 4.6 Cardiac Index (Fick) 2.9 PVR 2.4 WU 10. GI bleeding: Gastric AVM and also gastric ulcer seen in past.  - 8/19 capsule endoscopy showed small bowel AVMs beyond the reach of endoscopy.  - With recurrent episodes of GI bleeding, Eliquis was stopped.  11. Fe deficiency anemia.  12. Depression 13. OSA: Uses CPAP.   Social History   Socioeconomic History  . Marital status: Divorced    Spouse name: Not on file  . Number of children: 1  . Years of education: 66  . Highest education level: Not on file  Occupational History  . Not on file  Social Needs  . Financial resource strain: Not hard at all  . Food insecurity:    Worry: Never true    Inability: Sometimes true  . Transportation needs:    Medical: No    Non-medical: No  Tobacco Use  . Smoking status:  Never Smoker  . Smokeless tobacco: Never Used  Substance and Sexual Activity  . Alcohol use: Yes    Comment: 01/03/2018 "couple drinks/month"  . Drug use: No  . Sexual activity: Not Currently  Lifestyle  . Physical activity:    Days per week: Not on file    Minutes per session: Not on file  . Stress: Not on file  Relationships  . Social connections:    Talks on phone: Not on file    Gets together: Not on file    Attends religious service: Not on file    Active member of club or organization: Not on file    Attends meetings of clubs or organizations: Not on file    Relationship status: Not on  file  . Intimate partner violence:    Fear of current or ex partner: Not on file    Emotionally abused: Not on file    Physically abused: Not on file    Forced sexual activity: Not on file  Other Topics Concern  . Not on file  Social History Narrative   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      Home Situation: lives alone      Spiritual Beliefs: Christian - [employer]      Lifestyle: no regular exercise; poor diet      She is divorced at least one adult child   Never smoker    rare alcohol to occasional at best    no substance abuse         Family History  Problem Relation Age of Onset  . Asthma Father   . Heart attack Father   . Asthma Sister   . Lung cancer Sister   . Heart attack Mother   . Stroke Brother   . Colon cancer Neg Hx   . Esophageal cancer Neg Hx   . Pancreatic cancer Neg Hx   . Stomach cancer Neg Hx   . Liver disease Neg Hx    ROS: All systems reviewed and negative except as per HPI.   Current Outpatient Medications  Medication Sig Dispense Refill  . albuterol (PROVENTIL HFA;VENTOLIN HFA) 108 (90 Base) MCG/ACT inhaler Inhale 2 puffs into the lungs every 6 (six) hours as needed for wheezing or shortness of breath. 1 Inhaler 3  . alum & mag hydroxide-simeth (MAALOX/MYLANTA) 200-200-20 MG/5ML suspension Take 30 mLs by mouth every 6 (six) hours as needed for indigestion, heartburn or flatulence. 355 mL 0  . amiodarone (PACERONE) 200 MG tablet Take 200 mg by mouth at bedtime.     [name] BIDIL 20-37.5 MG tablet TAKE 1 TABLET BY MOUTH TWICE DAILY (Patient taking differently: Take 1 tablet by mouth 2 (two) times daily. ) 180 tablet 3  . carvedilol (COREG) 25 MG tablet Take 25 mg by mouth 2 (two) times daily.  3  . colchicine 0.6 MG tablet Take 0.6 mg by mouth daily as needed (gout flares).   0  . dicyclomine (BENTYL) 10 MG capsule Take 1 capsule (10 mg total) by mouth 3 (three) times daily before meals. 30 capsule 0  . hydrocortisone (ANUSOL-HC) 2.5 % rectal cream  Apply 1 application topically 4 (four) times daily as needed for hemorrhoids. 30 g 0  . levothyroxine (SYNTHROID, LEVOTHROID) 125 MCG tablet Take 125 mcg by mouth daily before breakfast.    . menthol-cetylpyridinium (CEPACOL) 3 MG lozenge Take 1 lozenge (3 mg total) by mouth as needed for sore throat (throat irritation / pain). 100 tablet 12  .  nitroGLYCERIN (NITROSTAT) 0.4 MG SL tablet Place 0.4 mg under the tongue every 5 (five) minutes as needed for chest pain.    [name] nystatin (MYCOSTATIN) 100000 UNIT/ML suspension Take 5 mLs (500,000 Units total) by mouth 4 (four) times daily. 60 mL 0  . oxyCODONE (OXY IR/ROXICODONE) 5 MG immediate release tablet Take 1 tablet (5 mg total) by mouth every 6 (six) hours as needed for moderate pain. 20 tablet 0  . pantoprazole (PROTONIX) 40 MG tablet Take 1 tablet (40 mg total) by mouth daily. 30 tablet 4  . polyethylene glycol (MIRALAX / GLYCOLAX) packet Take 17 g by mouth 2 (two) times daily. (Patient taking differently: Take 17 g by mouth daily as needed for mild constipation. ) 60 each 2  . potassium chloride 20 MEQ TBCR Take 20 mEq by mouth daily. 30 tablet 3  . saccharomyces boulardii (FLORASTOR) 250 MG capsule Take 1 capsule (250 mg total) by mouth 2 (two) times daily. 30 capsule 0  . sertraline (ZOLOFT) 50 MG tablet Take 1.5 tablets (75 mg total) by mouth daily. 135 tablet 3  . sodium bicarbonate 650 MG tablet Take 1 tablet (650 mg total) by mouth 2 (two) times daily. 60 tablet 0  . torsemide (DEMADEX) 20 MG tablet Take 1 tablet (20 mg total) by mouth daily. 30 tablet 11   No current facility-administered medications for this encounter.    BP (!) 163/53   Pulse 60   Wt 64.2 kg (141 lb 9.6 oz)   SpO2 100%   BMI 28.60 kg/m   Wt Readings from Last 3 Encounters:  03/26/18 64.2 kg (141 lb 9.6 oz)  03/19/18 66.8 kg (147 lb 4.8 oz)  03/06/18 63.5 kg (139 lb 14.4 oz)   General:  Appears chronically ill.  No resp difficulty. Arrived in a wheel chair.   HEENT: normal Neck: supple. no JVD. Carotids 2+ bilat; no bruits. No lymphadenopathy or thryomegaly appreciated. Cor: PMI nondisplaced. Regular rate & rhythm. No rubs, gallops or murmurs. Lungs: clear Abdomen: soft, nontender, nondistended. No hepatosplenomegaly. No bruits or masses. Good bowel sounds. Extremities: no cyanosis, clubbing, rash, edema Neuro: alert & orientedx3, cranial nerves grossly intact. moves all 4 extremities w/o difficulty. Affect pleasant  Assessment/Plan: 1. Chronic diastolic CHF: Patient has history of presumed nonischemic cardiomyopathy (Cardiolite with no ischemia, cath not done with CKD).  However, most recent echo in 4/19 showed EF improved to 60-65% with BiV pacing. 02/27/2018 ECHo - EF 55-60% moderate pulmonary HTN and moderate aoritic insufficiency.  Volume status stable. Change torsemide to 20 mg as needed for 3 pound weight gain.   2. Atrial flutter: History of atypical flutter, 3/19 DCCV.   -Regular on exam.  - Off Eliquis due to GI bleeding.  - Continues on amiodarone.   3. CKD: Stage 3, followed by [name].   I reviewed creatinine from 10/22. Creatinine was 2.2 which is stable for her.  4. GI bleeding: has had gastric ulcer and gastric AVMs as well as small bowel AVMs.  She is now off Eliquis. Followed by GI.   5. Hypertension Elevated. She did not take medications this morning  Continue current regimen.  6. Abdominal Pain Saw GI 10/22 with CT scan. Results pending. She was instructed to present to the ED if abdominal pain gets worse.   Follow up in 3 months with Dr [name].  Farhaan Mabee NP-C  03/26/2018

## 2018-03-26 NOTE — Patient Instructions (Signed)
CHANGE how you take Torsemide. Take Torsemide 20mg  as needed for 3 lb weight gain.   Your physician recommends that you schedule a follow-up appointment in: 3 months with Dr [name].

## 2018-03-27 DIAGNOSIS — R197 Diarrhea, unspecified: Secondary | ICD-10-CM | POA: Diagnosis not present

## 2018-03-28 ENCOUNTER — Telehealth: Payer: Self-pay | Admitting: Oncology

## 2018-03-28 NOTE — Telephone Encounter (Signed)
R/s appt per 11/20 sch messag e- pt is aware of appt date and time

## 2018-04-03 ENCOUNTER — Ambulatory Visit
Admission: RE | Admit: 2018-04-03 | Discharge: 2018-04-03 | Disposition: A | Discharge status: Home / Self Care | Payer: Medicare Other | Source: Ambulatory Visit | Attending: Surgery | Admitting: Surgery

## 2018-04-03 ENCOUNTER — Telehealth: Payer: Self-pay

## 2018-04-03 ENCOUNTER — Inpatient Hospital Stay (HOSPITAL_BASED_OUTPATIENT_CLINIC_OR_DEPARTMENT_OTHER): Payer: Medicare Other | Admitting: Oncology

## 2018-04-03 VITALS — BP 170/68 | HR 62 | Temp 98.0°F | Resp 17 | Ht 59.0 in | Wt 145.2 lb

## 2018-04-03 DIAGNOSIS — K529 Noninfective gastroenteritis and colitis, unspecified: Secondary | ICD-10-CM

## 2018-04-03 DIAGNOSIS — K5792 Diverticulitis of intestine, part unspecified, without perforation or abscess without bleeding: Secondary | ICD-10-CM

## 2018-04-03 DIAGNOSIS — K5732 Diverticulitis of large intestine without perforation or abscess without bleeding: Secondary | ICD-10-CM

## 2018-04-03 DIAGNOSIS — I700 Atherosclerosis of aorta: Secondary | ICD-10-CM

## 2018-04-03 DIAGNOSIS — R197 Diarrhea, unspecified: Secondary | ICD-10-CM

## 2018-04-03 DIAGNOSIS — N189 Chronic kidney disease, unspecified: Secondary | ICD-10-CM

## 2018-04-03 DIAGNOSIS — D631 Anemia in chronic kidney disease: Secondary | ICD-10-CM

## 2018-04-03 DIAGNOSIS — D509 Iron deficiency anemia, unspecified: Secondary | ICD-10-CM

## 2018-04-03 DIAGNOSIS — D259 Leiomyoma of uterus, unspecified: Secondary | ICD-10-CM

## 2018-04-03 DIAGNOSIS — K5700 Diverticulitis of small intestine with perforation and abscess without bleeding: Secondary | ICD-10-CM | POA: Diagnosis not present

## 2018-04-03 DIAGNOSIS — K7460 Unspecified cirrhosis of liver: Secondary | ICD-10-CM

## 2018-04-03 DIAGNOSIS — J90 Pleural effusion, not elsewhere classified: Secondary | ICD-10-CM

## 2018-04-03 DIAGNOSIS — K5780 Diverticulitis of intestine, part unspecified, with perforation and abscess without bleeding: Secondary | ICD-10-CM

## 2018-04-03 DIAGNOSIS — I129 Hypertensive chronic kidney disease with stage 1 through stage 4 chronic kidney disease, or unspecified chronic kidney disease: Secondary | ICD-10-CM

## 2018-04-03 DIAGNOSIS — Z79899 Other long term (current) drug therapy: Secondary | ICD-10-CM | POA: Diagnosis not present

## 2018-04-03 NOTE — Progress Notes (Signed)
Hematology and Oncology Follow Up Visit  Morgan Clay [id number] 1945-06-04 72 y.o. 04/03/2018 3:56 PM Morgan Clay, Morgan Major, DO   Principle Diagnosis: 72 year old woman with multifactorial anemia related to iron deficiency and chronic renal insufficiency.  This was diagnosed in July 2019.  Prior Therapy: Feraheme infusion intermittently as well as packed red cell transfusion.  Current therapy: Aranesp 300 mcg to keep her hemoglobin above 11.   Interim History: [name] presents today for a follow-up visit.  Since her last visit, she reports few complaints including abdominal distention and diarrhea.  She has been diagnosed with diverticulitis with abscess and has been evaluated by Dr. Johney [place] for possible surgical intervention.  He has been on Augmentin which she has been taken regularly.  She did complete a CT scan today which did not show any acute abnormalities.  Her energy has improved after correcting her hemoglobin and denies any hematochezia, melena or any active bleeding.  She is still able to drive and attends to activities of daily living.  She does not report any headaches, blurry vision, syncope or seizures.  She denies any alteration mental status or confusion.  Does not report any fevers, chills or sweats.  Does not report any cough, wheezing or hemoptysis. Does not report any chest pain, palpitation, orthopnea or leg edema.  Does not report any nausea, vomiting or early satiety. Does not report any change in bowel habits.  Does not report any arthralgias or myalgias.    Does not report frequency, urgency or hematuria.  Does not report any skin rashes or lesions. Does not report any lymphadenopathy or petechiae.  Does not report any mood changes.  Remaining review of systems is negative.    Medications: Current Outpatient Medications  Medication Sig Dispense Refill  . albuterol (PROVENTIL HFA;VENTOLIN HFA) 108 (90 Base) MCG/ACT inhaler Inhale 2 puffs into the lungs  every 6 (six) hours as needed for wheezing or shortness of breath. 1 Inhaler 3  . alum & mag hydroxide-simeth (MAALOX/MYLANTA) 200-200-20 MG/5ML suspension Take 30 mLs by mouth every 6 (six) hours as needed for indigestion, heartburn or flatulence. 355 mL 0  . amiodarone (PACERONE) 200 MG tablet Take 200 mg by mouth at bedtime.     [name] BIDIL 20-37.5 MG tablet TAKE 1 TABLET BY MOUTH TWICE DAILY (Patient taking differently: Take 1 tablet by mouth 2 (two) times daily. ) 180 tablet 3  . carvedilol (COREG) 25 MG tablet Take 25 mg by mouth 2 (two) times daily.  3  . colchicine 0.6 MG tablet Take 0.6 mg by mouth daily as needed (gout flares).   0  . dicyclomine (BENTYL) 10 MG capsule Take 1 capsule (10 mg total) by mouth 3 (three) times daily before meals. 30 capsule 0  . hydrocortisone (ANUSOL-HC) 2.5 % rectal cream Apply 1 application topically 4 (four) times daily as needed for hemorrhoids. 30 g 0  . levothyroxine (SYNTHROID, LEVOTHROID) 125 MCG tablet Take 125 mcg by mouth daily before breakfast.    . menthol-cetylpyridinium (CEPACOL) 3 MG lozenge Take 1 lozenge (3 mg total) by mouth as needed for sore throat (throat irritation / pain). 100 tablet 12  . nitroGLYCERIN (NITROSTAT) 0.4 MG SL tablet Place 0.4 mg under the tongue every 5 (five) minutes as needed for chest pain.    [name] nystatin (MYCOSTATIN) 100000 UNIT/ML suspension Take 5 mLs (500,000 Units total) by mouth 4 (four) times daily. 60 mL 0  . oxyCODONE (OXY IR/ROXICODONE) 5 MG immediate release tablet Take  1 tablet (5 mg total) by mouth every 6 (six) hours as needed for moderate pain. 20 tablet 0  . pantoprazole (PROTONIX) 40 MG tablet Take 1 tablet (40 mg total) by mouth daily. 30 tablet 4  . polyethylene glycol (MIRALAX / GLYCOLAX) packet Take 17 g by mouth 2 (two) times daily. (Patient taking differently: Take 17 g by mouth daily as needed for mild constipation. ) 60 each 2  . potassium chloride 20 MEQ TBCR Take 20 mEq by mouth daily. 30 tablet 3   . saccharomyces boulardii (FLORASTOR) 250 MG capsule Take 1 capsule (250 mg total) by mouth 2 (two) times daily. 30 capsule 0  . sertraline (ZOLOFT) 50 MG tablet Take 1.5 tablets (75 mg total) by mouth daily. 135 tablet 3  . sodium bicarbonate 650 MG tablet Take 1 tablet (650 mg total) by mouth 2 (two) times daily. 60 tablet 0  . torsemide (DEMADEX) 20 MG tablet Take 1 tablet (20 mg total) by mouth as needed (for 3 lb weight gain). 30 tablet 0   No current facility-administered medications for this visit.      Allergies: No Known Allergies  Past Medical History, Surgical history, Social history, and Family History were reviewed and updated.    Physical Exam: Blood pressure (!) 170/68, pulse 62, temperature 98 F (36.7 C), temperature source Oral, resp. rate 17, height 4\' 11"  (1.499 m), weight 145 lb 3.2 oz (65.9 kg), SpO2 99 %.   ECOG: 1   General appearance: Comfortable appearing without any discomfort Head: Normocephalic without any trauma Oropharynx: Mucous membranes are moist and pink without any thrush or ulcers. Eyes: Pupils are equal and round reactive to light. Lymph nodes: No cervical, supraclavicular, inguinal or axillary lymphadenopathy.   Heart:regular rate and rhythm.  S1 and S2 without leg edema. Lung: Clear without any rhonchi or wheezes.  No dullness to percussion. Abdomin: Soft, nontender, nondistended with good bowel sounds.  No hepatosplenomegaly. Musculoskeletal: No joint deformity or effusion.  Full range of motion noted. Neurological: No deficits noted on motor, sensory and deep tendon reflex exam. Skin: No petechial rash or dryness.  Appeared moist.    Lab Results: Lab Results  Component Value Date   WBC 6.3 03/22/2018   HGB 12.0 03/22/2018   HCT 37.3 03/22/2018   MCV 83.3 03/22/2018   PLT 217 03/22/2018     Chemistry      Component Value Date/Time   NA 141 03/19/2018 1649   NA 138 12/31/2017 0846   K 3.9 03/19/2018 1649   CL 106 03/19/2018  1649   CO2 26 03/19/2018 1649   BUN 28 (H) 03/19/2018 1649   BUN 99 (HH) 12/31/2017 0846   CREATININE 2.30 (H) 03/19/2018 1649   CREATININE 2.36 (H) 11/23/2017 1132   CREATININE 1.26 (H) 02/25/2016 1607   GLU 106 12/20/2016      Component Value Date/Time   CALCIUM 8.8 03/19/2018 1649   ALKPHOS 70 03/05/2018 0555   AST 29 03/05/2018 0555   AST 14 (L) 11/23/2017 1132   ALT 23 03/05/2018 0555   ALT 9 11/23/2017 1132   BILITOT 1.1 03/05/2018 0555   BILITOT 0.3 11/23/2017 1132       Radiological Studies: Ct Abdomen Pelvis Wo Contrast  Result Date: 04/03/2018 CLINICAL DATA:  Diverticulitis, abdominal pain. EXAM: CT ABDOMEN AND PELVIS WITHOUT CONTRAST TECHNIQUE: Multidetector CT imaging of the abdomen and pelvis was performed following the standard protocol without IV contrast. COMPARISON:  03/12/2018 FINDINGS: Lower chest: Cardiomegaly. Pacer wires  noted in the right heart. Moderate right pleural effusion with right base atelectasis, stable since prior study. Hepatobiliary: Nodular hepatic margins compatible with cirrhosis. No visible focal hepatic abnormality. Small amount of pericholecystic fluid, likely related to liver disease. Pancreas: No focal abnormality or ductal dilatation. Spleen: No focal abnormality.  Normal size. Adrenals/Urinary Tract: No renal or ureteral stones. No hydronephrosis. No adrenal mass. Urinary bladder grossly unremarkable. Stomach/Bowel: Few scattered colonic diverticula. No current evidence for active diverticulitis. Previously seen gas and fluid collection adjacent to the sigmoid colon has decreased in size, now approximately 2.2 x 1.8 cm compared previously to 2.4 x 2.3 cm. No internal gas within this area currently. No evidence of bowel obstruction. Vascular/Lymphatic: Aortic atherosclerosis. No enlarged abdominal or pelvic lymph nodes. Reproductive: Large central calcified fibroid measures up to 5.6 cm. Other smaller calcified fibroids. No adnexal mass. Other: No  free fluid or free air. Musculoskeletal: No acute bony abnormality. IMPRESSION: Moderate right pleural effusion with right base atelectasis, stable. Stable cirrhotic appearance of the liver. Improved diverticulitis changes in the sigmoid colon. Decreasing size of the adjacent gas and fluid collection as described above. Fibroid uterus. Aortic atherosclerosis. Electronically Signed   By: Rolm Baptise M.D.   On: 04/03/2018 14:36     Impression and Plan:  72 year old woman with:  1.  Multifactorial anemia related to blood loss and chronic renal insufficiency.  She has developed worsening anemia in September 2019 and after intravenous IV iron as well as Aranesp her hemoglobin has been corrected.  Options of therapy moving forward were discussed today.  He is would include observation and surveillance versus continuing Aranesp as needed to boost her hemoglobin above 11.  After discussion today, we have opted to continued observation and repeat laboratory testing on a monthly basis to ensure stability   2.  Colitis: CT scan of the abdomen and pelvis obtained today showed chronic cirrhosis of the liver with improved diverticulitis in the sigmoid colon.  3.  Follow-up: We will be in 2 months to follow her progress.  15  minutes was spent with the patient face-to-face today.  More than 50% of time was dedicated to reviewing laboratory data, disease status and discussing treatment options.    Zola Button, MD 11/27/20193:56 PM

## 2018-04-03 NOTE — Telephone Encounter (Signed)
Printed avs and calender of upcoming appointment. Per 11/27 los 

## 2018-04-09 ENCOUNTER — Ambulatory Visit (INDEPENDENT_AMBULATORY_CARE_PROVIDER_SITE_OTHER): Payer: Medicare Other

## 2018-04-09 DIAGNOSIS — Z95810 Presence of automatic (implantable) cardiac defibrillator: Secondary | ICD-10-CM

## 2018-04-09 DIAGNOSIS — I5032 Chronic diastolic (congestive) heart failure: Secondary | ICD-10-CM

## 2018-04-09 NOTE — Progress Notes (Signed)
EPIC Encounter for ICM Monitoring  Patient Name: Morgan Clay is a 72 y.o. female Date: 04/09/2018 Primary Care Physican: [name], DO Primary [place] Electrophysiologist: Caryl Comes Nephrologist: [name] Bi-V Pacing: 100% Last Weight: 149lbs  Today's Weight: 149 lbs        Heart Failure questions reviewed, pt has no energy.  She reported she does not ever feel well due to the low energy. 03/26/2018 HF clinic note states volume status stable.    Thoracic impedance abnormal suggesting fluid accumulation starting 03/01/2018.   Prescribed: Torsemide 20 mgTake 1 tablet (20 mg total) by mouth as needed (for 3 lb weight gain).   Labs: 03/19/2018 Creatinine 2.30, BUN 28, Potassium 3.9, Sodium 141 03/06/2018 Creatinine 2.55, BUN 30, Potassium 2.9, Sodium 136, eGFR 18-21  03/05/2018 Creatinine 3.52, BUN 31, Potassium 4.1, Sodium 130, eGFR 12-14  03/04/2018 Creatinine 3.64, BUN 31, Potassium 3.7, Sodium 132, eGFR 12-14  03/03/2018 Creatinine 3.16, BUN 33, Potassium 3.3, Sodium 136, eGFR 14-16  03/02/2018 Creatinine 2.50, BUN 31, Potassium 3.6, Sodium 141, eGFR 18-21  03/01/2018 Creatinine 2.15, BUN 34, Potassium 3.6, Sodium 136, eGFR 22-26  02/28/2018 Creatinine 2.09, BUN 35, Potassium 3.5, Sodium 140, eGFR 23-26  02/27/2018 Creatinine 2.26, BUN 38, Potassium 5.9, Sodium 136, eGFR 21-24  01/10/2018 Creatinine2.50, BUN43, Potassium3.8, Sodium139 01/05/2018 Creatinine2.83, BUN70, Potassium4.2, [id number], [id number]  01/04/2018 Creatinine2.84, BUN88, Potassium3.9, [id number], [id number]  01/03/2018 Creatinine3.16, BUN96, Potassium4.5, [id number], [id number]  12/31/2017 Creatinine3.48, BUN99, Potassium4.8, [id number], [id number]  12/24/2017 Creatinine3.36, BUN72, Potassium4.0, [id number], [id number]  A complete set of results can be found in Results Review.  Recommendations:  No changes.   Encouraged to call for fluid  symptoms.  Follow-up plan: ICM clinic phone appointment on 04/25/2018 to recheck fluid levels.      Copy of ICM check sent to Dr. Caryl Comes and Dr [name].   3 month ICM trend: 04/09/2018    1 Year ICM trend:       [name], RN 04/09/2018 8:14 AM

## 2018-04-11 ENCOUNTER — Telehealth (HOSPITAL_COMMUNITY): Payer: Self-pay | Admitting: Surgery

## 2018-04-11 NOTE — Telephone Encounter (Signed)
Patient called and made aware of the instructions from Dr. [name] to increase Torsemide to 40 mg alternating with 20 mg every other day.  Lab appt made for Dec. 12th.

## 2018-04-11 NOTE — Progress Notes (Signed)
Call to patient and confirmed she received new recommendations from HF clinic.  Will recheck fluid levels 04/25/2018

## 2018-04-11 NOTE — Progress Notes (Signed)
Dr Claris Gladden recommendations given to patient by Carole Binning, RN

## 2018-04-11 NOTE — Progress Notes (Signed)
Would try having her increase her torsemide to 40 daily alternating every other day with 20 daily.  Send for BMET in 1 week.

## 2018-04-13 ENCOUNTER — Telehealth: Payer: Self-pay | Admitting: Cardiology

## 2018-04-13 NOTE — Telephone Encounter (Signed)
Patient called complaining of shortness of breath.  She had spoken to Dr. Trey Paula few days ago and he suggested increasing her torsemide to 40 mg daily alternating with 20 mg every other day.  She has an appointment next week.  I suggested she try taking the torsemide 40 mg daily until she sees him in the office.  She knows to go to the emergency room if she become significantly uncomfortable, she did not feel like she needed to go the emergency room at this time.  Kerin Ransom PA-C 04/13/2018 10:59 AM

## 2018-04-16 DIAGNOSIS — N179 Acute kidney failure, unspecified: Secondary | ICD-10-CM | POA: Diagnosis not present

## 2018-04-16 DIAGNOSIS — M109 Gout, unspecified: Secondary | ICD-10-CM | POA: Diagnosis not present

## 2018-04-16 DIAGNOSIS — N183 Chronic kidney disease, stage 3 (moderate): Secondary | ICD-10-CM | POA: Diagnosis not present

## 2018-04-16 DIAGNOSIS — R809 Proteinuria, unspecified: Secondary | ICD-10-CM | POA: Diagnosis not present

## 2018-04-16 DIAGNOSIS — I129 Hypertensive chronic kidney disease with stage 1 through stage 4 chronic kidney disease, or unspecified chronic kidney disease: Secondary | ICD-10-CM | POA: Diagnosis not present

## 2018-04-16 DIAGNOSIS — D631 Anemia in chronic kidney disease: Secondary | ICD-10-CM | POA: Diagnosis not present

## 2018-04-16 DIAGNOSIS — N189 Chronic kidney disease, unspecified: Secondary | ICD-10-CM | POA: Diagnosis not present

## 2018-04-17 ENCOUNTER — Telehealth (HOSPITAL_COMMUNITY): Payer: Self-pay

## 2018-04-18 ENCOUNTER — Ambulatory Visit (HOSPITAL_COMMUNITY)
Admission: RE | Admit: 2018-04-18 | Discharge: 2018-04-18 | Disposition: A | Discharge status: Home / Self Care | Payer: Medicare Other | Source: Ambulatory Visit | Attending: Cardiology | Admitting: Cardiology

## 2018-04-18 DIAGNOSIS — I5032 Chronic diastolic (congestive) heart failure: Secondary | ICD-10-CM | POA: Insufficient documentation

## 2018-04-18 LAB — BASIC METABOLIC PANEL
ANION GAP: 12 (ref 5–15)
BUN: 35 mg/dL — ABNORMAL HIGH (ref 8–23)
CO2: 24 mmol/L (ref 22–32)
CREATININE: 2.11 mg/dL — AB (ref 0.44–1.00)
Calcium: 9 mg/dL (ref 8.9–10.3)
Chloride: 106 mmol/L (ref 98–111)
GFR calc Af Amer: 27 mL/min — ABNORMAL LOW (ref >60)
GFR, EST NON AFRICAN AMERICAN: 23 mL/min — AB (ref >60)
Glucose, Bld: 133 mg/dL — ABNORMAL HIGH (ref 70–99)
Potassium: 3.8 mmol/L (ref 3.5–5.1)
Sodium: 142 mmol/L (ref 135–145)

## 2018-04-18 NOTE — Telephone Encounter (Signed)
Opened in error

## 2018-04-23 ENCOUNTER — Encounter (HOSPITAL_COMMUNITY): Payer: Medicare Other | Admitting: Cardiology

## 2018-04-23 ENCOUNTER — Inpatient Hospital Stay (HOSPITAL_COMMUNITY): Admission: RE | Admit: 2018-04-23 | Payer: Medicare Other | Source: Ambulatory Visit | Admitting: Cardiology

## 2018-04-25 ENCOUNTER — Ambulatory Visit (INDEPENDENT_AMBULATORY_CARE_PROVIDER_SITE_OTHER): Payer: Medicare Other

## 2018-04-25 DIAGNOSIS — Z95810 Presence of automatic (implantable) cardiac defibrillator: Secondary | ICD-10-CM

## 2018-04-25 DIAGNOSIS — I5032 Chronic diastolic (congestive) heart failure: Secondary | ICD-10-CM | POA: Diagnosis not present

## 2018-04-25 NOTE — Progress Notes (Signed)
EPIC Encounter for ICM Monitoring  Patient Name: Morgan Clay is a 73 y.o. female Date: 04/25/2018 Primary Care Physican: [name], DO Primary [place] Electrophysiologist: Caryl Comes Nephrologist: [name] Bi-V Pacing: 99.9% Last Weight:149lbs  Today's Weight: unknown                                                   Attempted call to patient and unable to reach. Left message to return call, per DPR, regarding transmission.  Transmission reviewed.    Thoracic impedance improving and trending closer to baseline.   Prescribed: Torsemideincreased to 40 mg daily per Kerin Ransom, [place] office note 04/13/2018    Labs: 04/01/2018 Creatinine 2.11, BUN 35, Potassium 3.8, Sodium 142, eGFR23-27 03/19/2018 Creatinine2.30, BUN28, Potassium3.9, Sodium141 03/06/2018 Creatinine2.55, BUN30, Potassium2.9, Sodium136, [id number]  03/05/2018 Creatinine3.52, BUN31, Potassium4.1, Sodium130, WZLY78-00  03/04/2018 Creatinine3.64, BUN31, Potassium3.7, Sodium132, SYPZ58-06  03/03/2018 Creatinine3.16, BUN33, Potassium3.3, Sodium136, BEQU54-88  03/02/2018 Creatinine2.50, BUN31, Potassium3.6, [id number], [id number]  03/01/2018 Creatinine2.15, BUN34, Potassium3.6, Sodium136, XBOZ29-04  02/28/2018 Creatinine2.09, BUN35, Potassium3.5, BTVDFP792, BBWN75-42  02/27/2018 Creatinine2.26, BUN38, Potassium5.9, [id number], OPPU68-16 01/10/2018 Creatinine2.50, BUN43, Potassium3.8, [id number] 01/05/2018 Creatinine2.83, BUN70, Potassium4.2, QUCLTV982, SORT80-69  01/04/2018 Creatinine2.84, BUN88, Potassium3.9, NPMVAE773, BHGR10-71  01/03/2018 Creatinine3.16, BUN96, Potassium4.5, GREUXB980, OXUJ93-59  12/31/2017 Creatinine3.48, BUN99, Potassium4.8, AWNOPW256, PRKS84-57  12/24/2017 Creatinine3.36, BUN72, Potassium4.0, NRWKET015, TZOQ95-70 A complete set of results can be found in Results Review.  Recommendations:   Left voice mail with ICM number and encouraged to call if experiencing any fluid symptoms.  Follow-up plan: ICM clinic phone appointment on 05/30/2018.      Copy of ICM check sent to Dr. Caryl Comes and Dr [name].   3 month ICM trend: 04/25/2018    1 Year ICM trend:       Morgan Billings, RN 04/25/2018 12:08 PM

## 2018-04-30 ENCOUNTER — Inpatient Hospital Stay: Payer: Medicare Other | Attending: Oncology

## 2018-05-06 ENCOUNTER — Ambulatory Visit (INDEPENDENT_AMBULATORY_CARE_PROVIDER_SITE_OTHER): Payer: Medicare Other

## 2018-05-06 DIAGNOSIS — I428 Other cardiomyopathies: Secondary | ICD-10-CM

## 2018-05-06 NOTE — Progress Notes (Signed)
Remote ICD transmission.   

## 2018-05-07 LAB — CUP PACEART REMOTE DEVICE CHECK
Battery Remaining Longevity: 58 mo
Battery Voltage: 2.97 V
Brady Statistic AP VP Percent: 39.77 %
Brady Statistic AP VS Percent: 0.01 %
Brady Statistic AS VP Percent: 60.21 %
Brady Statistic AS VS Percent: 0 %
Brady Statistic RA Percent Paced: 39.65 %
Brady Statistic RV Percent Paced: 99.72 %
Date Time Interrogation Session: 20191230051804
HighPow Impedance: 41 Ohm
HighPow Impedance: 69 Ohm
Implantable Lead Implant Date: 20080818
Implantable Lead Implant Date: 20080818
Implantable Lead Location: 753858
Implantable Lead Location: 753859
Implantable Lead Location: 753860
Implantable Lead Model: 4193
Implantable Lead Model: 5076
Implantable Lead Model: 6947
Implantable Pulse Generator Implant Date: 20170531
Lead Channel Impedance Value: 266 Ohm
Lead Channel Impedance Value: 342 Ohm
Lead Channel Impedance Value: 4047 Ohm
Lead Channel Impedance Value: 4047 Ohm
Lead Channel Impedance Value: 456 Ohm
Lead Channel Pacing Threshold Amplitude: 0.875 V
Lead Channel Pacing Threshold Amplitude: 1.125 V
Lead Channel Pacing Threshold Amplitude: 1.25 V
Lead Channel Pacing Threshold Pulse Width: 0.4 ms
Lead Channel Pacing Threshold Pulse Width: 0.4 ms
Lead Channel Sensing Intrinsic Amplitude: 10.5 mV
Lead Channel Sensing Intrinsic Amplitude: 3.25 mV
Lead Channel Sensing Intrinsic Amplitude: 3.25 mV
Lead Channel Setting Pacing Amplitude: 2 V
Lead Channel Setting Pacing Amplitude: 2.5 V
Lead Channel Setting Pacing Pulse Width: 0.4 ms
Lead Channel Setting Pacing Pulse Width: 0.4 ms
Lead Channel Setting Sensing Sensitivity: 0.3 mV
MDC IDC LEAD IMPLANT DT: 20080818
MDC IDC MSMT LEADCHNL LV IMPEDANCE VALUE: 342 Ohm
MDC IDC MSMT LEADCHNL RA PACING THRESHOLD PULSEWIDTH: 0.4 ms
MDC IDC MSMT LEADCHNL RV SENSING INTR AMPL: 10.5 mV
MDC IDC SET LEADCHNL LV PACING AMPLITUDE: 1.75 V

## 2018-05-09 ENCOUNTER — Other Ambulatory Visit: Payer: Self-pay

## 2018-05-09 DIAGNOSIS — K7460 Unspecified cirrhosis of liver: Secondary | ICD-10-CM

## 2018-05-09 NOTE — Progress Notes (Signed)
hcv rna

## 2018-05-09 NOTE — Progress Notes (Signed)
Patient has cirrhosis on Ct and Hx of + HCV Ab and RNA (RNA last checked 2009)  We will ask her to do an HCV RNA quantitative test and if + refer for eval and Tx of HCV

## 2018-05-13 DIAGNOSIS — M25561 Pain in right knee: Secondary | ICD-10-CM

## 2018-05-13 HISTORY — DX: Pain in right knee: M25.561

## 2018-05-17 NOTE — Patient Instructions (Addendum)
Morgan Clay  05/17/2018   Your procedure is scheduled on: 05-22-2018    Report to [facility] Main  Entrance     Report to admitting at 6:30AM    Call this number if you have problems the morning of surgery [phone]      Remember: FOLLOW ALL BOWEL PREP INSTRUCTIONS PROVIDED BY YOUR SURGEON!  DRINK 2 PRESURGERY ENSURE DRINKS THE NIGHT BEFORE SURGERY AT 10:00 PM  NOTHING BY MOUTH EXCEPT CLEAR LIQUIDS UNTIL THREE HOURS PRIOR TO SCHEDULED SURGERY DRINK 1 PRESURGERY DRINK THE DAY OF THE PROCEDURE 3 HOURS PRIOR TO SCHEDULED SURGERY WHICH NEEDS TO BE COMPLETED AT ___5:30AM______.  BRUSH YOUR TEETH MORNING OF SURGERY AND RINSE YOUR MOUTH OUT, NO CHEWING GUM CANDY OR MINTS.      CLEAR LIQUID DIET   Foods Allowed                                                                     Foods Excluded  Coffee and tea, regular and decaf                             liquids that you cannot  Plain Jell-O in any flavor                                             see through such as: Fruit ices (not with fruit pulp)                                     milk, soups, orange juice  Iced Popsicles                                    All solid food Carbonated beverages, regular and diet                                    Cranberry, grape and apple juices Sports drinks like Gatorade Lightly seasoned clear broth or consume(fat free) Sugar, honey syrup  Sample Menu Breakfast                                Lunch                                     Supper Cranberry juice                    Beef broth                            Chicken broth Jell-O  Grape juice                           Apple juice Coffee or tea                        Jell-O                                      Popsicle                                                Coffee or tea                        Coffee or  tea  _____________________________________________________________________       Take these medicines the morning of surgery with A SIP OF WATER: BIDIL, CARVEDILOL, SERTRALINE, PROTONIX, ALLOPURINOL, LEVOTHYROXINE, COLCHICINE IF NEEDED, ALBUTEROL INHALER IF NEEDED                                 You may not have any metal on your body including hair pins and              piercings  Do not wear jewelry, make-up, lotions, powders or perfumes, deodorant             Do not wear nail polish.  Do not shave  48 hours prior to surgery.                 Do not bring valuables to the hospital. [place].  Contacts, dentures or bridgework may not be worn into surgery.  Leave suitcase in the car. After surgery it may be brought to your room.                   Please read over the following fact sheets you were given: _____________________________________________________________________             [facility] - Preparing for Surgery Before surgery, you can play an important role.  Because skin is not sterile, your skin needs to be as free of germs as possible.  You can reduce the number of germs on your skin by washing with CHG (chlorahexidine gluconate) soap before surgery.  CHG is an antiseptic cleaner which kills germs and bonds with the skin to continue killing germs even after washing. Please DO NOT use if you have an allergy to CHG or antibacterial soaps.  If your skin becomes reddened/irritated stop using the CHG and inform your nurse when you arrive at Short Stay. Do not shave (including legs and underarms) for at least 48 hours prior to the first CHG shower.  You may shave your face/neck. Please follow these instructions carefully:  1.  Shower with CHG Soap the night before surgery and the  morning of Surgery.  2.  If you choose to wash your hair, wash your hair first as usual with your  normal  shampoo.  3.  After you shampoo, rinse  your hair and body thoroughly  to remove the  shampoo.                           4.  Use CHG as you would any other liquid soap.  You can apply chg directly  to the skin and wash                       Gently with a scrungie or clean washcloth.  5.  Apply the CHG Soap to your body ONLY FROM THE NECK DOWN.   Do not use on face/ open                           Wound or open sores. Avoid contact with eyes, ears mouth and genitals (private parts).                       Wash face,  Genitals (private parts) with your normal soap.             6.  Wash thoroughly, paying special attention to the area where your surgery  will be performed.  7.  Thoroughly rinse your body with warm water from the neck down.  8.  DO NOT shower/wash with your normal soap after using and rinsing off  the CHG Soap.                9.  Pat yourself dry with a clean towel.            10.  Wear clean pajamas.            11.  Place clean sheets on your bed the night of your first shower and do not  sleep with pets. Day of Surgery : Do not apply any lotions/deodorants the morning of surgery.  Please wear clean clothes to the hospital/surgery center.  FAILURE TO FOLLOW THESE INSTRUCTIONS MAY RESULT IN THE CANCELLATION OF YOUR SURGERY PATIENT SIGNATURE_________________________________  NURSE SIGNATURE__________________________________  ________________________________________________________________________   Adam Phenix  An incentive spirometer is a tool that can help keep your lungs clear and active. This tool measures how well you are filling your lungs with each breath. Taking long deep breaths may help reverse or decrease the chance of developing breathing (pulmonary) problems (especially infection) following:  A long period of time when you are unable to move or be active. BEFORE THE PROCEDURE   If the spirometer includes an indicator to show your best effort, your nurse or respiratory therapist will set it to a  desired goal.  If possible, sit up straight or lean slightly forward. Try not to slouch.  Hold the incentive spirometer in an upright position. INSTRUCTIONS FOR USE  1. Sit on the edge of your bed if possible, or sit up as far as you can in bed or on a chair. 2. Hold the incentive spirometer in an upright position. 3. Breathe out normally. 4. Place the mouthpiece in your mouth and seal your lips tightly around it. 5. Breathe in slowly and as deeply as possible, raising the piston or the ball toward the top of the column. 6. Hold your breath for 3-5 seconds or for as long as possible. Allow the piston or ball to fall to the bottom of the column. 7. Remove the mouthpiece from your mouth and breathe out normally. 8. Rest for a few seconds and repeat Steps 1 through 7 at  least 10 times every 1-2 hours when you are awake. Take your time and take a few normal breaths between deep breaths. 9. The spirometer may include an indicator to show your best effort. Use the indicator as a goal to work toward during each repetition. 10. After each set of 10 deep breaths, practice coughing to be sure your lungs are clear. If you have an incision (the cut made at the time of surgery), support your incision when coughing by placing a pillow or rolled up towels firmly against it. Once you are able to get out of bed, walk around indoors and cough well. You may stop using the incentive spirometer when instructed by your caregiver.  RISKS AND COMPLICATIONS  Take your time so you do not get dizzy or light-headed.  If you are in pain, you may need to take or ask for pain medication before doing incentive spirometry. It is harder to take a deep breath if you are having pain. AFTER USE  Rest and breathe slowly and easily.  It can be helpful to keep track of a log of your progress. Your caregiver can provide you with a simple table to help with this. If you are using the spirometer at home, follow these  instructions: [place] IF:   You are having difficultly using the spirometer.  You have trouble using the spirometer as often as instructed.  Your pain medication is not giving enough relief while using the spirometer.  You develop fever of 100.5 F (38.1 C) or higher. SEEK IMMEDIATE MEDICAL CARE IF:   You cough up bloody sputum that had not been present before.  You develop fever of 102 F (38.9 C) or greater.  You develop worsening pain at or near the incision site. MAKE SURE YOU:   Understand these instructions.  Will watch your condition.  Will get help right away if you are not doing well or get worse. Document Released: 09/04/2006 Document Revised: 07/17/2011 Document Reviewed: 11/05/2006 [facility] Patient Information 2014 [place], [place].   ________________________________________________________________________

## 2018-05-20 ENCOUNTER — Encounter (HOSPITAL_COMMUNITY)
Admission: RE | Admit: 2018-05-20 | Discharge: 2018-05-20 | Disposition: A | Discharge status: Home / Self Care | Payer: Medicare Other | Source: Ambulatory Visit | Attending: Surgery | Admitting: Surgery

## 2018-05-20 ENCOUNTER — Ambulatory Visit (HOSPITAL_COMMUNITY)
Admission: RE | Admit: 2018-05-20 | Discharge: 2018-05-20 | Disposition: A | Discharge status: Home / Self Care | Payer: Medicare Other | Source: Ambulatory Visit | Attending: Anesthesiology | Admitting: Anesthesiology

## 2018-05-20 ENCOUNTER — Other Ambulatory Visit: Payer: Self-pay

## 2018-05-20 ENCOUNTER — Encounter (HOSPITAL_COMMUNITY): Payer: Self-pay

## 2018-05-20 DIAGNOSIS — I428 Other cardiomyopathies: Secondary | ICD-10-CM | POA: Diagnosis not present

## 2018-05-20 DIAGNOSIS — G4733 Obstructive sleep apnea (adult) (pediatric): Secondary | ICD-10-CM | POA: Diagnosis not present

## 2018-05-20 DIAGNOSIS — K5720 Diverticulitis of large intestine with perforation and abscess without bleeding: Secondary | ICD-10-CM | POA: Diagnosis not present

## 2018-05-20 DIAGNOSIS — I130 Hypertensive heart and chronic kidney disease with heart failure and stage 1 through stage 4 chronic kidney disease, or unspecified chronic kidney disease: Secondary | ICD-10-CM | POA: Diagnosis not present

## 2018-05-20 DIAGNOSIS — E1122 Type 2 diabetes mellitus with diabetic chronic kidney disease: Secondary | ICD-10-CM | POA: Diagnosis not present

## 2018-05-20 DIAGNOSIS — J189 Pneumonia, unspecified organism: Secondary | ICD-10-CM

## 2018-05-20 DIAGNOSIS — Z01811 Encounter for preprocedural respiratory examination: Secondary | ICD-10-CM | POA: Insufficient documentation

## 2018-05-20 DIAGNOSIS — I5032 Chronic diastolic (congestive) heart failure: Secondary | ICD-10-CM | POA: Diagnosis not present

## 2018-05-20 DIAGNOSIS — J449 Chronic obstructive pulmonary disease, unspecified: Secondary | ICD-10-CM | POA: Diagnosis not present

## 2018-05-20 DIAGNOSIS — D631 Anemia in chronic kidney disease: Secondary | ICD-10-CM | POA: Diagnosis not present

## 2018-05-20 DIAGNOSIS — I482 Chronic atrial fibrillation, unspecified: Secondary | ICD-10-CM | POA: Diagnosis not present

## 2018-05-20 DIAGNOSIS — Z01818 Encounter for other preprocedural examination: Secondary | ICD-10-CM | POA: Diagnosis not present

## 2018-05-20 DIAGNOSIS — K4090 Unilateral inguinal hernia, without obstruction or gangrene, not specified as recurrent: Secondary | ICD-10-CM | POA: Diagnosis not present

## 2018-05-20 DIAGNOSIS — K761 Chronic passive congestion of liver: Secondary | ICD-10-CM | POA: Diagnosis not present

## 2018-05-20 DIAGNOSIS — Z7984 Long term (current) use of oral hypoglycemic drugs: Secondary | ICD-10-CM | POA: Diagnosis not present

## 2018-05-20 DIAGNOSIS — K219 Gastro-esophageal reflux disease without esophagitis: Secondary | ICD-10-CM | POA: Diagnosis not present

## 2018-05-20 DIAGNOSIS — E1169 Type 2 diabetes mellitus with other specified complication: Secondary | ICD-10-CM | POA: Diagnosis not present

## 2018-05-20 DIAGNOSIS — I272 Pulmonary hypertension, unspecified: Secondary | ICD-10-CM | POA: Diagnosis not present

## 2018-05-20 DIAGNOSIS — M109 Gout, unspecified: Secondary | ICD-10-CM | POA: Diagnosis not present

## 2018-05-20 DIAGNOSIS — I152 Hypertension secondary to endocrine disorders: Secondary | ICD-10-CM | POA: Diagnosis not present

## 2018-05-20 DIAGNOSIS — E039 Hypothyroidism, unspecified: Secondary | ICD-10-CM | POA: Diagnosis not present

## 2018-05-20 DIAGNOSIS — N184 Chronic kidney disease, stage 4 (severe): Secondary | ICD-10-CM | POA: Diagnosis not present

## 2018-05-20 DIAGNOSIS — J181 Lobar pneumonia, unspecified organism: Secondary | ICD-10-CM | POA: Diagnosis not present

## 2018-05-20 HISTORY — DX: Type 2 diabetes mellitus without complications: E11.9

## 2018-05-20 LAB — CBC
HCT: 42.4 % (ref 36.0–46.0)
Hemoglobin: 13.3 g/dL (ref 12.0–15.0)
MCH: 27.9 pg (ref 26.0–34.0)
MCHC: 31.4 g/dL (ref 30.0–36.0)
MCV: 89.1 fL (ref 80.0–100.0)
Platelets: 194 10*3/uL (ref 150–400)
RBC: 4.76 MIL/uL (ref 3.87–5.11)
RDW: 19.4 % — ABNORMAL HIGH (ref 11.5–15.5)
WBC: 5.4 10*3/uL (ref 4.0–10.5)
nRBC: 0 % (ref 0.0–0.2)

## 2018-05-20 LAB — BASIC METABOLIC PANEL
Anion gap: 12 (ref 5–15)
BUN: 50 mg/dL — AB (ref 8–23)
CO2: 26 mmol/L (ref 22–32)
Calcium: 10.1 mg/dL (ref 8.9–10.3)
Chloride: 101 mmol/L (ref 98–111)
Creatinine, Ser: 1.82 mg/dL — ABNORMAL HIGH (ref 0.44–1.00)
GFR calc Af Amer: 32 mL/min — ABNORMAL LOW (ref >60)
GFR calc non Af Amer: 28 mL/min — ABNORMAL LOW (ref >60)
GLUCOSE: 119 mg/dL — AB (ref 70–99)
Potassium: 3.6 mmol/L (ref 3.5–5.1)
Sodium: 139 mmol/L (ref 135–145)

## 2018-05-20 LAB — HEMOGLOBIN A1C
Hgb A1c MFr Bld: 7.1 % — ABNORMAL HIGH (ref 4.8–5.6)
Mean Plasma Glucose: 157.07 mg/dL

## 2018-05-20 NOTE — Progress Notes (Signed)
Abnormal pre-op BMP , result routed to surgeon. Chart placed in PA Jessica basket to review

## 2018-05-20 NOTE — Progress Notes (Signed)
Anesthesia Chart Review   Case:  [id number] Date/Time:  05/22/18 0800   Procedures:      XI ROBOTIC ASSISTED RESECTION OF SIGMOID COLON ERAS PATHWAY (N/A )     RIGID PROCTOSCOPY (N/A )   Anesthesia type:  General   Pre-op diagnosis:  Diverticulitis   Location:  [facility] ROOM 02 / [facility]   Surgeon:  Michael Boston, MD      DISCUSSION: 73 yo never smoker with h/o LBBB, CHF, OSA (does not use CPAP), HLD, anemia, AICD present (perioperative prescription received, on chart), asthma, COPD, PVD, CKD, hypothyroidism, depression, HTN, Hepatitis C (treated with harvoni), DM II scheduled for above surgery with Dr. Michael Boston on 05/22/2018.   Pt with h/o nonischemic cardiomyopathy with recovery of EF with BiV pacing, paroxysmal atrial flutter (successful DCCV 3/19, maintained on amiodarone). She discontinued Eliquis in the past due to recurrent GI bleeds.  Last seen by cardiology 02/09/2019, updated echo and cardiac cath ordered at this visit.  Per Dr. Loralie Champagne on 03/14/2019, "EF has recovered, she is not on Eliquis.  Ok for surgery."  CKD followed by nephrology, [name].  Creatinine at PST visit on 05/20/18 1.82, at baseline.   She was last seen by PCP, Dr. Colin Benton on 03/19/18.   VS: BP (!) 151/66   Temp 36.5 C (Oral)   Resp 16   PROVIDERS: [name], DO is PCP  Loralie Champagne, MD is Cardiology   Caryl Comes, MD is Electrophysiologist LABS: Labs reviewed: Acceptable for surgery. (all labs ordered are listed, but only abnormal results are displayed)  Labs Reviewed  BASIC METABOLIC PANEL - Abnormal; Notable for the following components:      Result Value   Glucose, Bld 119 (*)    BUN 50 (*)    Creatinine, Ser 1.82 (*)    GFR calc non Af Amer 28 (*)    GFR calc Af Amer 32 (*)    All other components within normal limits  CBC - Abnormal; Notable for the following components:   RDW 19.4 (*)    All other components within normal limits  HEMOGLOBIN A1C  HEMOGLOBIN A1C  TYPE AND SCREEN      IMAGES: CT Abdomen Pelvis 04/03/18 IMPRESSION: Moderate right pleural effusion with right base atelectasis, stable. Stable cirrhotic appearance of the liver. Improved diverticulitis changes in the sigmoid colon. Decreasing size of the adjacent gas and fluid collection as described above. Fibroid uterus. Aortic atherosclerosis.  EKG: 02/13/18 Rate 60 bpm AV dual-paced rhythm Biventricular packemaker detected Abnormal ECG Since last tracing no significant change   CV: Cardiac Cath 02/13/18 1. Elevated PCWP, normal RA pressure.  2. Mild-moderate pulmonary venous hypertension.  3. Preserved cardiac output.   She is improving symptomatically with transition to torsemide last week.  I will continue her on torsemide 40 qam/20 qpm, will send a BMET today.    Echo 02/27/18 Study Conclusions  - Left ventricle: The cavity size was normal. There was moderate focal basal hypertrophy. Systolic function was normal. The estimated ejection fraction was in the range of 55% to 60%. Wall motion was normal; there were no regional wall motion abnormalities. Doppler parameters are consistent with high ventricular filling pressure. - Aortic valve: Trileaflet; mildly thickened, mildly calcified leaflets. There was moderate regurgitation. - Mitral valve: There was mild to moderate regurgitation. - Left atrium: The atrium was severely dilated. - Right ventricle: The cavity size was moderately dilated. Wall thickness was normal. Pacer wire or catheter noted in right  ventricle. - Right atrium: Pacer wire or catheter noted in right atrium. - Pulmonary arteries: PA peak pressure: 60 mm Hg (S). - Line: A venous catheter was visualized in the superior vena cava, with its tip in the right atrium. No abnormal features noted.  Impressions: - The right ventricular systolic pressure was increased consistent with moderate pulmonary hypertension. Past Medical History:  Diagnosis Date  . A-fib ([place])    on  Eliquis  . AICD (automatic cardioverter/defibrillator) present 10/06/2015  . Anemia   . Arthritis    "qwhere" (01/03/2018)  . Asthma    reports mild asthma since childhood - had COPD on dx list from prior PCP  . Chronic bronchitis ([place])    "get it most years; not this past year though" (01/03/2018)  . Chronic kidney disease    "? stage;  followed by [facility]; [name]" (01/03/2018)  . Chronic systolic congestive heart failure ([place])   . DEPRESSION 12/17/2009   Annotation: PHQ-9 score = 14 done on 12/17/2009 Qualifier: Diagnosis of  By: Hassell Done FNP, Tori Milks    . Diabetes mellitus without complication (HCC)    DIET CONTROLLED   . Diverticulosis   . FIBROIDS, UTERUS 03/05/2008  . Glaucoma   . Hepatitis C    HEPATITIS C - s/p treatment with Harvoni, saw hepatology, Dawn Drazek  . History of blood transfusion ~ 11/2017  . Hyperlipemia 12/06/2012  . HYPERTENSION, BENIGN 04/24/2007  . Hypothyroidism   . LBBB (left bundle branch block)   . Lower GI bleeding    "been dealing w/it since 07/2017" (01/03/2018)  . Nonischemic cardiomyopathy ([place])   . OBESITY 05/27/2009  . OBSTRUCTIVE SLEEP APNEA 11/14/2007   no CPAP  . OSTEOPENIA 09/30/2008  . Personal history of other infectious and parasitic disease    Hepatitis B  . Pneumonia    "several times" (01/03/2018)    Past Surgical History:  Procedure Laterality Date  . APPENDECTOMY    . CARDIAC CATHETERIZATION    . CARDIOVERSION N/A 12/14/2014   Procedure: CARDIOVERSION;  Surgeon: Dorothy Spark, MD;  Location: [facility] ENDOSCOPY;  Service: Cardiovascular;  Laterality: N/A;  . CARDIOVERSION N/A 02/26/2017   Procedure: CARDIOVERSION;  Surgeon: Evans Lance, MD;  Location: [facility] LAB;  Service: Cardiovascular;  Laterality: N/A;  . CARDIOVERSION N/A 07/24/2017   Procedure: CARDIOVERSION;  Surgeon: Thayer Headings, MD;  Location: [facility] ENDOSCOPY;  Service: Cardiovascular;  Laterality: N/A;  . COLONOSCOPY N/A 11/08/2017   Procedure:  COLONOSCOPY;  Surgeon: Milus Banister, MD;  Location[facility];  Service: Endoscopy;  Laterality: N/A;  . DILATION AND CURETTAGE OF UTERUS    . EP IMPLANTABLE DEVICE N/A 10/06/2015   Procedure: BIV ICD Generator Changeout;  Surgeon: [name], MD;  Location: [facility];  Service: Cardiovascular;  Laterality: N/A;  . ESOPHAGOGASTRODUODENOSCOPY N/A 10/26/2017   Procedure: ESOPHAGOGASTRODUODENOSCOPY (EGD);  Surgeon: Ladene Artist, MD;  Location: [facility] END[facility];  Service: Endoscopy;  Laterality: N/A;  . ESOPHAGOGASTRODUODENOSCOPY (EGD) WITH PROPOFOL N/A 11/07/2017   Procedure: ESOPHAGOGASTRODUODENOSCOPY (EGD) WITH PROPOFOL;  Surgeon: Milus Banister, MD;  [facility];  Service: Endoscopy;  Laterality: N/A;  . HOT HEMOSTASIS N/A 10/26/2017   Procedure: HOT HEMOSTASIS (ARGON PLASMA COAGULATION/BICAP);  Surgeon: Ladene Artist, MD;  Location: [facility];  Service: Endoscopy;  Laterality: N/A;  . INSERT / REPLACE / REMOVE PACEMAKER  ?2008  . POLYPECTOMY  11/08/2017   Procedure: POLYPECTOMY;  Surgeon: Milus Banister, MD;  Location[facility];  Service: Endoscopy;;  . RIGHT HEART CATH N/A 02/13/2018   Procedure: RIGHT HEART CATH;  Surgeon: [name], MD;  Location: [facility];  Service: Cardiovascular;  Laterality: N/A;  . TUBAL LIGATION      MEDICATIONS: . albuterol (PROVENTIL HFA;VENTOLIN HFA) 108 (90 Base) MCG/ACT inhaler  . allopurinol (ZYLOPRIM) 300 MG tablet  . alum & mag hydroxide-simeth (MAALOX/MYLANTA) 200-200-20 MG/5ML suspension  . BIDIL 20-37.5 MG tablet  . carvedilol (COREG) 25 MG tablet  . colchicine 0.6 MG tablet  . dicyclomine (BENTYL) 10 MG capsule  . hydrocortisone (ANUSOL-HC) 2.5 % rectal cream  . levothyroxine (SYNTHROID, LEVOTHROID) 125 MCG tablet  . menthol-cetylpyridinium (CEPACOL) 3 MG lozenge  . nitroGLYCERIN (NITROSTAT) 0.4 MG SL tablet  . nystatin (MYCOSTATIN) 100000 UNIT/ML suspension  . oxyCODONE (OXY IR/ROXICODONE) 5  MG immediate release tablet  . pantoprazole (PROTONIX) 40 MG tablet  . polyethylene glycol (MIRALAX / GLYCOLAX) packet  . potassium chloride 20 MEQ TBCR  . saccharomyces boulardii (FLORASTOR) 250 MG capsule  . sertraline (ZOLOFT) 50 MG tablet  . sodium bicarbonate 650 MG tablet  . torsemide (DEMADEX) 20 MG tablet   No current facility-administered medications for this encounter.     Maia Plan [facility] Pre-Surgical Testing [phone] 05/21/18 12:20 PM

## 2018-05-20 NOTE — Consult Note (Signed)
[name] requested for preoperative stoma site marking.  Diverticulitis with abscess.  Possible ostomy.  Will mark in both upper quadrants.  Patient has had significant weight loss and has deep creasing at umbilicus.  Marking high is her best option if a stoma is needed   Discussed surgical procedure and stoma creation with patient  Explained role of the [facility] nurse team.  Provided the patient with educational booklet and provided samples of pouching options.  Answered patient questions.   Examined patient sitting, and standing in order to place the marking in the patient's visual field, away from any creases or abdominal contour issues and within the rectus muscle.  Deep creasing at umbilicus noted.   Marked for colostomy in the LUQ 5 cm to the left of the umbilicus and 4 _cm above the umbilicus.  Marked for ileostomy in the RLQ  5 cm to the right of the umbilicus and  4 cm above the umbilicus.    Patient's abdomen cleansed with CHG wipes at site markings, allowed to air dry prior to marking.Covered mark with thin film transparent dressing to preserve mark until date of surgery.   [name] will follow up with patient after surgery for continue ostomy care and teaching.   Domenic Moras MSN, RN, FNP-BC [facility] Wound, Ostomy, Continence Nurse Pager [phone]

## 2018-05-21 MED ORDER — BUPIVACAINE LIPOSOME 1.3 % IJ SUSP
20.0000 mL | Freq: Once | INTRAMUSCULAR | Status: DC
  Filled 2018-05-21: qty 20

## 2018-05-21 MED ORDER — SODIUM CHLORIDE 0.9 % IV SOLN
INTRAVENOUS | Status: DC
  Filled 2018-05-21 (×2): qty 6

## 2018-05-21 NOTE — Anesthesia Preprocedure Evaluation (Addendum)
Anesthesia Evaluation  Patient identified by MRN, date of birth, ID band Patient awake    Reviewed: Allergy & Precautions, NPO status , Patient's Chart, lab work & pertinent test results  Airway Mallampati: II  TM Distance: >3 FB Neck ROM: Full    Dental no notable dental hx. (+) Partial Lower, Partial Upper   Pulmonary asthma , sleep apnea , COPD,    Pulmonary exam normal breath sounds clear to auscultation       Cardiovascular hypertension, Pt. on medications +CHF  Normal cardiovascular exam+ dysrhythmias Atrial Fibrillation + Cardiac Defibrillator  Rhythm:Regular Rate:Normal  LBBB   Neuro/Psych negative neurological ROS  negative psych ROS   GI/Hepatic negative GI ROS, (+) Hepatitis - (treated), C  Endo/Other  diabetes  Renal/GU negative Renal ROS  negative genitourinary   Musculoskeletal negative musculoskeletal ROS (+)   Abdominal   Peds negative pediatric ROS (+)  Hematology negative hematology ROS (+)   Anesthesia Other Findings   Reproductive/Obstetrics negative OB ROS                                                            Anesthesia Evaluation  Patient identified by MRN, date of birth, ID band Patient awake    Reviewed: Allergy & Precautions, NPO status , Patient's Chart, lab work & pertinent test results, reviewed documented beta blocker date and time   Airway Mallampati: II  TM Distance: >3 FB Neck ROM: Full    Dental  (+) Lower Dentures, Partial Upper, Caps   Pulmonary asthma , sleep apnea and Continuous Positive Airway Pressure Ventilation , COPD,  COPD inhaler,    Pulmonary exam normal breath sounds clear to auscultation       Cardiovascular hypertension, Pt. on medications and Pt. on home beta blockers + Peripheral Vascular Disease and +CHF  Normal cardiovascular exam+ dysrhythmias Atrial Fibrillation + pacemaker + Cardiac  Defibrillator  Rhythm:Regular Rate:Normal  EKG 11/06/2017- atrial sensed ventricular paced rhythm Echo- 4/6/2019Left ventricle: The cavity size was normal. Wall thickness was  normal. Systolic function was normal. The estimated ejection  fraction was in the range of 60% to 65%. Wall motion was normal;  there were no regional wall motion abnormalities. Features are  consistent with a pseudonormal left ventricular filling pattern,  with concomitant abnormal relaxation and increased filling  pressure (grade 2 diastolic dysfunction). - Aortic valve: There was moderate regurgitation. - Mitral valve: There was moderate regurgitation. - Left atrium: The atrium was mildly dilated. - Right atrium: The atrium was mildly dilated. - Tricuspid valve: There was moderate regurgitation. - Pulmonary arteries: Systolic pressure was moderately increased.  PA peak pressure: 49 mm Hg (S).   Neuro/Psych PSYCHIATRIC DISORDERS Depression negative neurological ROS     GI/Hepatic Neg liver ROS, Melena   Endo/Other  diabetes, Well Controlled, Type 2, Oral Hypoglycemic AgentsHypothyroidism   Renal/GU Renal InsufficiencyRenal disease  negative genitourinary   Musculoskeletal  (+) Arthritis ,   Abdominal   Peds  Hematology  (+) anemia , Eliquis- last dose 11/06/2017   Anesthesia Other Findings   Reproductive/Obstetrics                             Anesthesia Physical Anesthesia Plan  ASA: III  Anesthesia Plan: MAC  Post-op Pain Management:    Induction: Intravenous  PONV Risk Score and Plan: 2 and Propofol infusion, Ondansetron and Treatment may vary due to age or medical condition  Airway Management Planned: Natural Airway and Nasal Cannula  Additional Equipment:   Intra-op Plan:   Post-operative Plan:   Informed Consent: I have reviewed the patients History and Physical, chart, labs and discussed the procedure including the risks, benefits and alternatives for the  proposed anesthesia with the patient or authorized representative who has indicated his/her understanding and acceptance.   Dental advisory given  Plan Discussed with: CRNA and Surgeon  Anesthesia Plan Comments:         Anesthesia Quick Evaluation  Anesthesia Physical Anesthesia Plan  ASA: III  Anesthesia Plan: General   Post-op Pain Management:    Induction: Intravenous  PONV Risk Score and Plan: 4 or greater and Ondansetron, Treatment may vary due to age or medical condition and Dexamethasone  Airway Management Planned: Oral ETT  Additional Equipment:   Intra-op Plan:   Post-operative Plan: Extubation in OR  Informed Consent: I have reviewed the patients History and Physical, chart, labs and discussed the procedure including the risks, benefits and alternatives for the proposed anesthesia with the patient or authorized representative who has indicated his/her understanding and acceptance.     Dental advisory given  Plan Discussed with: CRNA  Anesthesia Plan Comments: (See PST note 05/20/18, Konrad Felix, PA-C)       Anesthesia Quick Evaluation

## 2018-05-22 ENCOUNTER — Encounter (HOSPITAL_COMMUNITY)
Admission: RE | Disposition: A | Discharge status: Home / Self Care | Payer: Self-pay | Source: Ambulatory Visit | Attending: Surgery

## 2018-05-22 ENCOUNTER — Inpatient Hospital Stay (HOSPITAL_COMMUNITY): Payer: Medicare Other | Admitting: Certified Registered Nurse Anesthetist

## 2018-05-22 ENCOUNTER — Inpatient Hospital Stay (HOSPITAL_COMMUNITY): Payer: Medicare Other | Admitting: Physician Assistant

## 2018-05-22 ENCOUNTER — Encounter (HOSPITAL_COMMUNITY): Payer: Self-pay | Admitting: General Practice

## 2018-05-22 ENCOUNTER — Inpatient Hospital Stay (HOSPITAL_COMMUNITY)
Admission: RE | Admit: 2018-05-22 | Discharge: 2018-05-26 | DRG: 330 | Disposition: A | Discharge status: Home / Self Care | Payer: Medicare Other | Source: Ambulatory Visit | Attending: Surgery | Admitting: Surgery

## 2018-05-22 DIAGNOSIS — B1920 Unspecified viral hepatitis C without hepatic coma: Secondary | ICD-10-CM | POA: Diagnosis present

## 2018-05-22 DIAGNOSIS — Z973 Presence of spectacles and contact lenses: Secondary | ICD-10-CM

## 2018-05-22 DIAGNOSIS — N184 Chronic kidney disease, stage 4 (severe): Secondary | ICD-10-CM | POA: Diagnosis present

## 2018-05-22 DIAGNOSIS — D259 Leiomyoma of uterus, unspecified: Secondary | ICD-10-CM | POA: Diagnosis present

## 2018-05-22 DIAGNOSIS — K5732 Diverticulitis of large intestine without perforation or abscess without bleeding: Secondary | ICD-10-CM | POA: Diagnosis not present

## 2018-05-22 DIAGNOSIS — I428 Other cardiomyopathies: Secondary | ICD-10-CM | POA: Diagnosis present

## 2018-05-22 DIAGNOSIS — I152 Hypertension secondary to endocrine disorders: Secondary | ICD-10-CM | POA: Diagnosis present

## 2018-05-22 DIAGNOSIS — K219 Gastro-esophageal reflux disease without esophagitis: Secondary | ICD-10-CM | POA: Diagnosis present

## 2018-05-22 DIAGNOSIS — E039 Hypothyroidism, unspecified: Secondary | ICD-10-CM | POA: Diagnosis present

## 2018-05-22 DIAGNOSIS — Z7984 Long term (current) use of oral hypoglycemic drugs: Secondary | ICD-10-CM

## 2018-05-22 DIAGNOSIS — Z7289 Other problems related to lifestyle: Secondary | ICD-10-CM

## 2018-05-22 DIAGNOSIS — I5032 Chronic diastolic (congestive) heart failure: Secondary | ICD-10-CM | POA: Diagnosis not present

## 2018-05-22 DIAGNOSIS — Z811 Family history of alcohol abuse and dependence: Secondary | ICD-10-CM

## 2018-05-22 DIAGNOSIS — E119 Type 2 diabetes mellitus without complications: Secondary | ICD-10-CM | POA: Diagnosis not present

## 2018-05-22 DIAGNOSIS — E1159 Type 2 diabetes mellitus with other circulatory complications: Secondary | ICD-10-CM | POA: Diagnosis present

## 2018-05-22 DIAGNOSIS — E669 Obesity, unspecified: Secondary | ICD-10-CM | POA: Diagnosis present

## 2018-05-22 DIAGNOSIS — Z95810 Presence of automatic (implantable) cardiac defibrillator: Secondary | ICD-10-CM

## 2018-05-22 DIAGNOSIS — E1169 Type 2 diabetes mellitus with other specified complication: Secondary | ICD-10-CM | POA: Diagnosis present

## 2018-05-22 DIAGNOSIS — J45909 Unspecified asthma, uncomplicated: Secondary | ICD-10-CM | POA: Diagnosis present

## 2018-05-22 DIAGNOSIS — K5720 Diverticulitis of large intestine with perforation and abscess without bleeding: Principal | ICD-10-CM | POA: Diagnosis present

## 2018-05-22 DIAGNOSIS — K4090 Unilateral inguinal hernia, without obstruction or gangrene, not specified as recurrent: Secondary | ICD-10-CM | POA: Diagnosis present

## 2018-05-22 DIAGNOSIS — D649 Anemia, unspecified: Secondary | ICD-10-CM | POA: Diagnosis present

## 2018-05-22 DIAGNOSIS — Z6828 Body mass index (BMI) 28.0-28.9, adult: Secondary | ICD-10-CM

## 2018-05-22 DIAGNOSIS — K5730 Diverticulosis of large intestine without perforation or abscess without bleeding: Secondary | ICD-10-CM | POA: Diagnosis not present

## 2018-05-22 DIAGNOSIS — D631 Anemia in chronic kidney disease: Secondary | ICD-10-CM | POA: Diagnosis present

## 2018-05-22 DIAGNOSIS — I482 Chronic atrial fibrillation, unspecified: Secondary | ICD-10-CM | POA: Diagnosis present

## 2018-05-22 DIAGNOSIS — I272 Pulmonary hypertension, unspecified: Secondary | ICD-10-CM | POA: Diagnosis present

## 2018-05-22 DIAGNOSIS — Z8249 Family history of ischemic heart disease and other diseases of the circulatory system: Secondary | ICD-10-CM

## 2018-05-22 DIAGNOSIS — G4733 Obstructive sleep apnea (adult) (pediatric): Secondary | ICD-10-CM | POA: Diagnosis present

## 2018-05-22 DIAGNOSIS — I130 Hypertensive heart and chronic kidney disease with heart failure and stage 1 through stage 4 chronic kidney disease, or unspecified chronic kidney disease: Secondary | ICD-10-CM | POA: Diagnosis present

## 2018-05-22 DIAGNOSIS — H409 Unspecified glaucoma: Secondary | ICD-10-CM | POA: Diagnosis present

## 2018-05-22 DIAGNOSIS — K761 Chronic passive congestion of liver: Secondary | ICD-10-CM | POA: Diagnosis present

## 2018-05-22 DIAGNOSIS — M109 Gout, unspecified: Secondary | ICD-10-CM | POA: Diagnosis present

## 2018-05-22 DIAGNOSIS — E1122 Type 2 diabetes mellitus with diabetic chronic kidney disease: Secondary | ICD-10-CM | POA: Diagnosis present

## 2018-05-22 DIAGNOSIS — J449 Chronic obstructive pulmonary disease, unspecified: Secondary | ICD-10-CM | POA: Diagnosis present

## 2018-05-22 DIAGNOSIS — Z79890 Hormone replacement therapy (postmenopausal): Secondary | ICD-10-CM

## 2018-05-22 DIAGNOSIS — I10 Essential (primary) hypertension: Secondary | ICD-10-CM

## 2018-05-22 DIAGNOSIS — N189 Chronic kidney disease, unspecified: Secondary | ICD-10-CM

## 2018-05-22 DIAGNOSIS — E1129 Type 2 diabetes mellitus with other diabetic kidney complication: Secondary | ICD-10-CM | POA: Diagnosis present

## 2018-05-22 DIAGNOSIS — F3341 Major depressive disorder, recurrent, in partial remission: Secondary | ICD-10-CM | POA: Diagnosis present

## 2018-05-22 DIAGNOSIS — Z79899 Other long term (current) drug therapy: Secondary | ICD-10-CM

## 2018-05-22 HISTORY — DX: Acute pulmonary edema: J81.0

## 2018-05-22 HISTORY — PX: XI ROBOTIC ASSISTED LOWER ANTERIOR RESECTION: SHX6558

## 2018-05-22 HISTORY — DX: Pain in left arm: M79.602

## 2018-05-22 HISTORY — DX: Noninfective gastroenteritis and colitis, unspecified: K52.9

## 2018-05-22 HISTORY — DX: Gout, unspecified: M10.9

## 2018-05-22 HISTORY — DX: Acute kidney failure, unspecified: N17.9

## 2018-05-22 HISTORY — PX: PROCTOSCOPY: SHX2266

## 2018-05-22 HISTORY — DX: Gastrointestinal hemorrhage, unspecified: K92.2

## 2018-05-22 HISTORY — DX: Other specified soft tissue disorders: M79.89

## 2018-05-22 LAB — GLUCOSE, CAPILLARY
Glucose-Capillary: 182 mg/dL — ABNORMAL HIGH (ref 70–99)
Glucose-Capillary: 200 mg/dL — ABNORMAL HIGH (ref 70–99)
Glucose-Capillary: 209 mg/dL — ABNORMAL HIGH (ref 70–99)

## 2018-05-22 SURGERY — RESECTION, RECTUM, LOW ANTERIOR, ROBOT-ASSISTED
Anesthesia: General | Site: Rectum

## 2018-05-22 MED ORDER — SACCHAROMYCES BOULARDII 250 MG PO CAPS
250.0000 mg | ORAL_CAPSULE | Freq: Two times a day (BID) | ORAL | Status: DC
  Administered 2018-05-22 – 2018-05-26 (×8): 250 mg via ORAL
  Filled 2018-05-22 (×8): qty 1

## 2018-05-22 MED ORDER — ENOXAPARIN SODIUM 40 MG/0.4ML ~~LOC~~ SOLN
40.0000 mg | SUBCUTANEOUS | Status: DC
  Administered 2018-05-23: 40 mg via SUBCUTANEOUS
  Filled 2018-05-22: qty 0.4

## 2018-05-22 MED ORDER — GABAPENTIN 300 MG PO CAPS
300.0000 mg | ORAL_CAPSULE | ORAL | Status: AC
  Administered 2018-05-22: 300 mg via ORAL
  Filled 2018-05-22: qty 1

## 2018-05-22 MED ORDER — ONDANSETRON HCL 4 MG/2ML IJ SOLN: 4.0000 mg | Freq: Four times a day (QID) | INTRAMUSCULAR | Status: DC | PRN

## 2018-05-22 MED ORDER — LIDOCAINE 2% (20 MG/ML) 5 ML SYRINGE
INTRAMUSCULAR | Status: DC | PRN
  Administered 2018-05-22: 60 mg via INTRAVENOUS

## 2018-05-22 MED ORDER — INSULIN ASPART 100 UNIT/ML ~~LOC~~ SOLN
SUBCUTANEOUS | Status: AC
  Filled 2018-05-22: qty 1

## 2018-05-22 MED ORDER — METOPROLOL TARTRATE 5 MG/5ML IV SOLN: 5.0000 mg | Freq: Four times a day (QID) | INTRAVENOUS | Status: DC | PRN

## 2018-05-22 MED ORDER — SODIUM CHLORIDE 0.9 % IV SOLN
2.0000 g | Freq: Two times a day (BID) | INTRAVENOUS | Status: AC
  Administered 2018-05-22: 2 g via INTRAVENOUS
  Filled 2018-05-22: qty 2

## 2018-05-22 MED ORDER — ACETAMINOPHEN 500 MG PO TABS
1000.0000 mg | ORAL_TABLET | ORAL | Status: AC
  Administered 2018-05-22: 1000 mg via ORAL
  Filled 2018-05-22: qty 2

## 2018-05-22 MED ORDER — LIDOCAINE 2% (20 MG/ML) 5 ML SYRINGE
INTRAMUSCULAR | Status: DC | PRN
  Administered 2018-05-22: 1 mg/kg/h via INTRAVENOUS

## 2018-05-22 MED ORDER — LEVOTHYROXINE SODIUM 25 MCG PO TABS
125.0000 ug | ORAL_TABLET | Freq: Every day | ORAL | Status: DC
  Administered 2018-05-23 – 2018-05-26 (×4): 125 ug via ORAL
  Filled 2018-05-22 (×4): qty 1

## 2018-05-22 MED ORDER — LIDOCAINE 2% (20 MG/ML) 5 ML SYRINGE
INTRAMUSCULAR | Status: AC
  Filled 2018-05-22: qty 10

## 2018-05-22 MED ORDER — PROPOFOL 10 MG/ML IV BOLUS
INTRAVENOUS | Status: DC | PRN
  Administered 2018-05-22: 140 mg via INTRAVENOUS

## 2018-05-22 MED ORDER — BUPIVACAINE-EPINEPHRINE (PF) 0.25% -1:200000 IJ SOLN
INTRAMUSCULAR | Status: DC | PRN
  Administered 2018-05-22: 60 mL

## 2018-05-22 MED ORDER — KETAMINE HCL 10 MG/ML IJ SOLN
INTRAMUSCULAR | Status: AC
  Filled 2018-05-22: qty 1

## 2018-05-22 MED ORDER — METOCLOPRAMIDE HCL 5 MG/ML IJ SOLN: 5.0000 mg | Freq: Three times a day (TID) | INTRAMUSCULAR | Status: DC | PRN

## 2018-05-22 MED ORDER — SODIUM CHLORIDE 0.9 % IV SOLN
INTRAVENOUS | Status: DC | PRN
  Administered 2018-05-22: 1000 mL via INTRAPERITONEAL

## 2018-05-22 MED ORDER — POTASSIUM CHLORIDE CRYS ER 10 MEQ PO TBCR
20.0000 meq | EXTENDED_RELEASE_TABLET | Freq: Every day | ORAL | Status: DC
  Administered 2018-05-23 – 2018-05-26 (×4): 20 meq via ORAL
  Filled 2018-05-22 (×5): qty 2

## 2018-05-22 MED ORDER — DEXAMETHASONE SODIUM PHOSPHATE 10 MG/ML IJ SOLN
INTRAMUSCULAR | Status: DC | PRN
  Administered 2018-05-22: 10 mg via INTRAVENOUS

## 2018-05-22 MED ORDER — HYDROMORPHONE HCL 1 MG/ML IJ SOLN
INTRAMUSCULAR | Status: AC
  Administered 2018-05-22: 0.5 mg via INTRAVENOUS
  Filled 2018-05-22: qty 1

## 2018-05-22 MED ORDER — TRAMADOL HCL 50 MG PO TABS
50.0000 mg | ORAL_TABLET | Freq: Four times a day (QID) | ORAL | Status: DC | PRN
  Administered 2018-05-24: 50 mg via ORAL
  Administered 2018-05-24 – 2018-05-25 (×2): 100 mg via ORAL
  Filled 2018-05-22: qty 1
  Filled 2018-05-22 (×2): qty 2

## 2018-05-22 MED ORDER — FENTANYL CITRATE (PF) 250 MCG/5ML IJ SOLN
INTRAMUSCULAR | Status: AC
  Filled 2018-05-22: qty 5

## 2018-05-22 MED ORDER — MAGIC MOUTHWASH
15.0000 mL | Freq: Four times a day (QID) | ORAL | Status: DC | PRN
  Filled 2018-05-22: qty 15

## 2018-05-22 MED ORDER — LIP MEDEX EX OINT
1.0000 "application " | TOPICAL_OINTMENT | Freq: Two times a day (BID) | CUTANEOUS | Status: DC
  Administered 2018-05-22 – 2018-05-26 (×7): 1 via TOPICAL
  Filled 2018-05-22 (×2): qty 7

## 2018-05-22 MED ORDER — LACTATED RINGERS IR SOLN
Status: DC | PRN
  Administered 2018-05-22: 1000 mL

## 2018-05-22 MED ORDER — SODIUM CHLORIDE 0.9 % IV SOLN
INTRAVENOUS | Status: DC | PRN
  Administered 2018-05-22: 50 ug/min via INTRAVENOUS

## 2018-05-22 MED ORDER — OXYCODONE HCL 5 MG PO TABS
5.0000 mg | ORAL_TABLET | Freq: Four times a day (QID) | ORAL | Status: DC | PRN
  Administered 2018-05-23 – 2018-05-26 (×5): 5 mg via ORAL
  Filled 2018-05-22 (×5): qty 1

## 2018-05-22 MED ORDER — BISACODYL 5 MG PO TBEC: 20.0000 mg | DELAYED_RELEASE_TABLET | Freq: Once | ORAL | Status: DC

## 2018-05-22 MED ORDER — ENSURE SURGERY PO LIQD
237.0000 mL | Freq: Two times a day (BID) | ORAL | Status: DC
  Administered 2018-05-23 – 2018-05-25 (×5): 237 mL via ORAL
  Filled 2018-05-22 (×8): qty 237

## 2018-05-22 MED ORDER — FENTANYL CITRATE (PF) 250 MCG/5ML IJ SOLN
INTRAMUSCULAR | Status: DC | PRN
  Administered 2018-05-22: 50 ug via INTRAVENOUS
  Administered 2018-05-22: 100 ug via INTRAVENOUS
  Administered 2018-05-22 (×2): 50 ug via INTRAVENOUS

## 2018-05-22 MED ORDER — METOCLOPRAMIDE HCL 5 MG/ML IJ SOLN: 10.0000 mg | Freq: Four times a day (QID) | INTRAMUSCULAR | Status: DC | PRN

## 2018-05-22 MED ORDER — LACTATED RINGERS IV SOLN
INTRAVENOUS | Status: AC
  Administered 2018-05-22: 20:00:00 via INTRAVENOUS

## 2018-05-22 MED ORDER — HYDRALAZINE HCL 20 MG/ML IJ SOLN: 10.0000 mg | INTRAMUSCULAR | Status: DC | PRN

## 2018-05-22 MED ORDER — ROCURONIUM BROMIDE 10 MG/ML (PF) SYRINGE
PREFILLED_SYRINGE | INTRAVENOUS | Status: DC | PRN
  Administered 2018-05-22: 100 mg via INTRAVENOUS
  Administered 2018-05-22: 50 mg via INTRAVENOUS

## 2018-05-22 MED ORDER — DIPHENHYDRAMINE HCL 12.5 MG/5ML PO ELIX: 12.5000 mg | ORAL_SOLUTION | Freq: Four times a day (QID) | ORAL | Status: DC | PRN

## 2018-05-22 MED ORDER — ALUM & MAG HYDROXIDE-SIMETH 200-200-20 MG/5ML PO SUSP: 30.0000 mL | Freq: Four times a day (QID) | ORAL | Status: DC | PRN

## 2018-05-22 MED ORDER — SODIUM CHLORIDE 0.9 % IV SOLN
1000.0000 mL | Freq: Three times a day (TID) | INTRAVENOUS | Status: AC | PRN
  Administered 2018-05-23 (×2): 1000 mL via INTRAVENOUS

## 2018-05-22 MED ORDER — TORSEMIDE 20 MG PO TABS
40.0000 mg | ORAL_TABLET | Freq: Every day | ORAL | Status: DC
  Administered 2018-05-23 – 2018-05-26 (×4): 40 mg via ORAL
  Filled 2018-05-22 (×4): qty 2

## 2018-05-22 MED ORDER — FENTANYL CITRATE (PF) 100 MCG/2ML IJ SOLN
INTRAMUSCULAR | Status: AC
  Administered 2018-05-22: 50 ug via INTRAVENOUS
  Filled 2018-05-22: qty 2

## 2018-05-22 MED ORDER — CARVEDILOL 25 MG PO TABS
25.0000 mg | ORAL_TABLET | Freq: Two times a day (BID) | ORAL | Status: DC
  Administered 2018-05-23 – 2018-05-26 (×7): 25 mg via ORAL
  Filled 2018-05-22 (×7): qty 1

## 2018-05-22 MED ORDER — DIPHENHYDRAMINE HCL 50 MG/ML IJ SOLN: 12.5000 mg | Freq: Four times a day (QID) | INTRAMUSCULAR | Status: DC | PRN

## 2018-05-22 MED ORDER — PROPOFOL 10 MG/ML IV BOLUS
INTRAVENOUS | Status: AC
  Filled 2018-05-22: qty 20

## 2018-05-22 MED ORDER — SODIUM CHLORIDE 0.9 % IV SOLN
2.0000 g | INTRAVENOUS | Status: AC
  Administered 2018-05-22: 2 g via INTRAVENOUS
  Filled 2018-05-22: qty 2

## 2018-05-22 MED ORDER — SERTRALINE HCL 50 MG PO TABS
75.0000 mg | ORAL_TABLET | Freq: Every day | ORAL | Status: DC
  Administered 2018-05-23 – 2018-05-26 (×4): 75 mg via ORAL
  Filled 2018-05-22 (×4): qty 1

## 2018-05-22 MED ORDER — PROCHLORPERAZINE EDISYLATE 10 MG/2ML IJ SOLN: 5.0000 mg | Freq: Four times a day (QID) | INTRAMUSCULAR | Status: DC | PRN

## 2018-05-22 MED ORDER — MEPERIDINE HCL 50 MG/ML IJ SOLN: 6.2500 mg | INTRAMUSCULAR | Status: DC | PRN

## 2018-05-22 MED ORDER — METOCLOPRAMIDE HCL 5 MG/ML IJ SOLN: 10.0000 mg | Freq: Once | INTRAMUSCULAR | Status: DC | PRN

## 2018-05-22 MED ORDER — COLCHICINE 0.6 MG PO TABS: 0.6000 mg | ORAL_TABLET | Freq: Every day | ORAL | Status: DC | PRN

## 2018-05-22 MED ORDER — HYDROMORPHONE HCL 1 MG/ML IJ SOLN
0.5000 mg | INTRAMUSCULAR | Status: DC | PRN
  Administered 2018-05-22 – 2018-05-24 (×5): 1 mg via INTRAVENOUS
  Filled 2018-05-22 (×5): qty 1

## 2018-05-22 MED ORDER — MAGIC MOUTHWASH: 15.0000 mL | Freq: Four times a day (QID) | ORAL | Status: DC | PRN

## 2018-05-22 MED ORDER — ONDANSETRON HCL 4 MG/2ML IJ SOLN
INTRAMUSCULAR | Status: DC | PRN
  Administered 2018-05-22: 4 mg via INTRAVENOUS

## 2018-05-22 MED ORDER — MIDAZOLAM HCL 2 MG/2ML IJ SOLN
INTRAMUSCULAR | Status: AC
  Filled 2018-05-22: qty 2

## 2018-05-22 MED ORDER — METHOCARBAMOL 1000 MG/10ML IJ SOLN
1000.0000 mg | Freq: Four times a day (QID) | INTRAVENOUS | Status: DC | PRN
  Administered 2018-05-22: 1000 mg via INTRAVENOUS
  Filled 2018-05-22 (×2): qty 10

## 2018-05-22 MED ORDER — PROCHLORPERAZINE MALEATE 10 MG PO TABS: 10.0000 mg | ORAL_TABLET | Freq: Four times a day (QID) | ORAL | Status: DC | PRN

## 2018-05-22 MED ORDER — ISOSORB DINITRATE-HYDRALAZINE 20-37.5 MG PO TABS
1.0000 | ORAL_TABLET | Freq: Two times a day (BID) | ORAL | Status: DC
  Administered 2018-05-22 – 2018-05-26 (×8): 1 via ORAL
  Filled 2018-05-22 (×8): qty 1

## 2018-05-22 MED ORDER — POLYETHYLENE GLYCOL 3350 17 GM/SCOOP PO POWD: 1.0000 | Freq: Once | ORAL | Status: DC

## 2018-05-22 MED ORDER — SUGAMMADEX SODIUM 200 MG/2ML IV SOLN
INTRAVENOUS | Status: AC
  Filled 2018-05-22: qty 4

## 2018-05-22 MED ORDER — LIP MEDEX EX OINT: 1.0000 "application " | TOPICAL_OINTMENT | Freq: Two times a day (BID) | CUTANEOUS | Status: DC

## 2018-05-22 MED ORDER — ONDANSETRON HCL 4 MG PO TABS: 4.0000 mg | ORAL_TABLET | Freq: Four times a day (QID) | ORAL | Status: DC | PRN

## 2018-05-22 MED ORDER — ALVIMOPAN 12 MG PO CAPS
12.0000 mg | ORAL_CAPSULE | ORAL | Status: AC
  Administered 2018-05-22: 12 mg via ORAL
  Filled 2018-05-22: qty 1

## 2018-05-22 MED ORDER — INDOCYANINE GREEN 25 MG IV SOLR
INTRAVENOUS | Status: DC | PRN
  Administered 2018-05-22: 7.5 mg via INTRAVENOUS

## 2018-05-22 MED ORDER — HYDROMORPHONE HCL 1 MG/ML IJ SOLN
0.2500 mg | INTRAMUSCULAR | Status: DC | PRN
  Administered 2018-05-22 (×3): 0.5 mg via INTRAVENOUS

## 2018-05-22 MED ORDER — ENOXAPARIN SODIUM 40 MG/0.4ML ~~LOC~~ SOLN
40.0000 mg | Freq: Once | SUBCUTANEOUS | Status: AC
  Administered 2018-05-22: 40 mg via SUBCUTANEOUS
  Filled 2018-05-22: qty 0.4

## 2018-05-22 MED ORDER — HYDROMORPHONE HCL 1 MG/ML IJ SOLN
INTRAMUSCULAR | Status: AC
  Filled 2018-05-22: qty 1

## 2018-05-22 MED ORDER — FENTANYL CITRATE (PF) 100 MCG/2ML IJ SOLN
25.0000 ug | INTRAMUSCULAR | Status: DC | PRN
  Administered 2018-05-22 (×2): 50 ug via INTRAVENOUS

## 2018-05-22 MED ORDER — LACTATED RINGERS IV SOLN
INTRAVENOUS | Status: DC
  Administered 2018-05-22 – 2018-05-24 (×3): via INTRAVENOUS

## 2018-05-22 MED ORDER — ACETAMINOPHEN 500 MG PO TABS
1000.0000 mg | ORAL_TABLET | Freq: Four times a day (QID) | ORAL | Status: DC
  Administered 2018-05-23 – 2018-05-26 (×11): 1000 mg via ORAL
  Filled 2018-05-22 (×12): qty 2

## 2018-05-22 MED ORDER — INSULIN ASPART 100 UNIT/ML ~~LOC~~ SOLN
5.0000 [IU] | Freq: Once | SUBCUTANEOUS | Status: AC
  Administered 2018-05-22: 5 [IU] via SUBCUTANEOUS

## 2018-05-22 MED ORDER — SUGAMMADEX SODIUM 200 MG/2ML IV SOLN
INTRAVENOUS | Status: DC | PRN
  Administered 2018-05-22: 300 mg via INTRAVENOUS

## 2018-05-22 MED ORDER — SODIUM BICARBONATE 650 MG PO TABS
650.0000 mg | ORAL_TABLET | Freq: Every day | ORAL | Status: DC
  Administered 2018-05-23 – 2018-05-26 (×4): 650 mg via ORAL
  Filled 2018-05-22 (×5): qty 1

## 2018-05-22 MED ORDER — MIDAZOLAM HCL 5 MG/5ML IJ SOLN
INTRAMUSCULAR | Status: DC | PRN
  Administered 2018-05-22: 2 mg via INTRAVENOUS

## 2018-05-22 MED ORDER — ALLOPURINOL 300 MG PO TABS
300.0000 mg | ORAL_TABLET | Freq: Every day | ORAL | Status: DC
  Administered 2018-05-23 – 2018-05-26 (×4): 300 mg via ORAL
  Filled 2018-05-22 (×5): qty 1

## 2018-05-22 MED ORDER — GABAPENTIN 300 MG PO CAPS
300.0000 mg | ORAL_CAPSULE | Freq: Two times a day (BID) | ORAL | Status: DC
  Administered 2018-05-22 – 2018-05-23 (×3): 300 mg via ORAL
  Filled 2018-05-22 (×3): qty 1

## 2018-05-22 MED ORDER — ALBUTEROL SULFATE (2.5 MG/3ML) 0.083% IN NEBU: 3.0000 mL | INHALATION_SOLUTION | Freq: Four times a day (QID) | RESPIRATORY_TRACT | Status: DC | PRN

## 2018-05-22 MED ORDER — KETAMINE HCL 10 MG/ML IJ SOLN
INTRAMUSCULAR | Status: DC | PRN
  Administered 2018-05-22: 30 mg via INTRAVENOUS

## 2018-05-22 MED ORDER — ALVIMOPAN 12 MG PO CAPS
12.0000 mg | ORAL_CAPSULE | Freq: Two times a day (BID) | ORAL | Status: DC
  Administered 2018-05-23 (×2): 12 mg via ORAL
  Filled 2018-05-22 (×2): qty 1

## 2018-05-22 MED ORDER — BUPIVACAINE LIPOSOME 1.3 % IJ SUSP
INTRAMUSCULAR | Status: DC | PRN
  Administered 2018-05-22: 20 mL

## 2018-05-22 MED ORDER — BUPIVACAINE-EPINEPHRINE (PF) 0.25% -1:200000 IJ SOLN
INTRAMUSCULAR | Status: AC
  Filled 2018-05-22: qty 60

## 2018-05-22 MED ORDER — 0.9 % SODIUM CHLORIDE (POUR BTL) OPTIME
TOPICAL | Status: DC | PRN
  Administered 2018-05-22: 2000 mL

## 2018-05-22 MED ORDER — LIDOCAINE HCL 2 % IJ SOLN
INTRAMUSCULAR | Status: AC
  Filled 2018-05-22: qty 40

## 2018-05-22 SURGICAL SUPPLY — 101 items
APPLIER CLIP 5 13 M/L LIGAMAX5 (MISCELLANEOUS)
APPLIER CLIP ROT 10 11.4 M/L (STAPLE)
BLADE EXTENDED COATED 6.5IN (ELECTRODE) ×3 IMPLANT
CANNULA REDUC XI 12-8 STAPL (CANNULA) ×1
CANNULA REDUCER 12-8 DVNC XI (CANNULA) ×2 IMPLANT
CELLS DAT CNTRL 66122 CELL SVR (MISCELLANEOUS) IMPLANT
CHLORAPREP W/TINT 26ML (MISCELLANEOUS) ×3 IMPLANT
CLIP APPLIE 5 13 M/L LIGAMAX5 (MISCELLANEOUS) IMPLANT
CLIP APPLIE ROT 10 11.4 M/L (STAPLE) IMPLANT
CLIP VESOLOCK LG 6/CT PURPLE (CLIP) IMPLANT
CLIP VESOLOCK MED LG 6/CT (CLIP) IMPLANT
COVER SURGICAL LIGHT HANDLE (MISCELLANEOUS) ×6 IMPLANT
COVER TIP SHEARS 8 DVNC (MISCELLANEOUS) ×2 IMPLANT
COVER TIP SHEARS 8MM DA VINCI (MISCELLANEOUS) ×1
COVER WAND RF STERILE (DRAPES) ×1 IMPLANT
DECANTER SPIKE VIAL GLASS SM (MISCELLANEOUS) ×3 IMPLANT
DEVICE TROCAR PUNCTURE CLOSURE (ENDOMECHANICALS) IMPLANT
DRAIN CHANNEL 19F RND (DRAIN) ×3 IMPLANT
DRAPE ARM DVNC X/XI (DISPOSABLE) ×8 IMPLANT
DRAPE COLUMN DVNC XI (DISPOSABLE) ×2 IMPLANT
DRAPE DA VINCI XI ARM (DISPOSABLE) ×4
DRAPE DA VINCI XI COLUMN (DISPOSABLE) ×1
DRAPE SURG IRRIG POUCH 19X23 (DRAPES) ×3 IMPLANT
DRSG OPSITE POSTOP 4X10 (GAUZE/BANDAGES/DRESSINGS) IMPLANT
DRSG OPSITE POSTOP 4X6 (GAUZE/BANDAGES/DRESSINGS) IMPLANT
DRSG OPSITE POSTOP 4X8 (GAUZE/BANDAGES/DRESSINGS) ×1 IMPLANT
DRSG TEGADERM 2-3/8X2-3/4 SM (GAUZE/BANDAGES/DRESSINGS) ×11 IMPLANT
DRSG TEGADERM 4X4.75 (GAUZE/BANDAGES/DRESSINGS) ×3 IMPLANT
ELECT PENCIL ROCKER SW 15FT (MISCELLANEOUS) ×3 IMPLANT
ELECT REM PT RETURN 15FT ADLT (MISCELLANEOUS) ×3 IMPLANT
ENDOLOOP SUT PDS II  0 18 (SUTURE)
ENDOLOOP SUT PDS II 0 18 (SUTURE) IMPLANT
EVACUATOR SILICONE 100CC (DRAIN) ×3 IMPLANT
GAUZE SPONGE 2X2 8PLY STRL LF (GAUZE/BANDAGES/DRESSINGS) ×2 IMPLANT
GAUZE SPONGE 4X4 12PLY STRL (GAUZE/BANDAGES/DRESSINGS) IMPLANT
GLOVE ECLIPSE 8.0 STRL XLNG CF (GLOVE) ×15 IMPLANT
GLOVE INDICATOR 8.0 STRL GRN (GLOVE) ×15 IMPLANT
GOWN STRL REUS W/TWL XL LVL3 (GOWN DISPOSABLE) ×15 IMPLANT
GRASPER SUT TROCAR 14GX15 (MISCELLANEOUS) ×3 IMPLANT
HOLDER FOLEY CATH W/STRAP (MISCELLANEOUS) ×3 IMPLANT
IRRIG SUCT STRYKERFLOW 2 WTIP (MISCELLANEOUS)
IRRIGATION SUCT STRKRFLW 2 WTP (MISCELLANEOUS) IMPLANT
KIT PROCEDURE DA VINCI SI (MISCELLANEOUS)
KIT PROCEDURE DVNC SI (MISCELLANEOUS) ×2 IMPLANT
NDL INSUFFLATION 14GA 120MM (NEEDLE) ×2 IMPLANT
NEEDLE INSUFFLATION 14GA 120MM (NEEDLE) ×3 IMPLANT
PACK CARDIOVASCULAR III (CUSTOM PROCEDURE TRAY) ×3 IMPLANT
PACK COLON (CUSTOM PROCEDURE TRAY) ×3 IMPLANT
PAD POSITIONING PINK XL (MISCELLANEOUS) ×3 IMPLANT
PORT LAP GEL ALEXIS MED 5-9CM (MISCELLANEOUS) ×3 IMPLANT
PROTECTOR NERVE ULNAR (MISCELLANEOUS) ×6 IMPLANT
RELOAD STAPLE 45 BLU REG DVNC (STAPLE) IMPLANT
RELOAD STAPLE 45 GRN THCK DVNC (STAPLE) IMPLANT
RETRACTOR WND ALEXIS 18 MED (MISCELLANEOUS) IMPLANT
RTRCTR WOUND ALEXIS 18CM MED (MISCELLANEOUS)
SCISSORS LAP 5X35 DISP (ENDOMECHANICALS) ×2 IMPLANT
SEAL CANN UNIV 5-8 DVNC XI (MISCELLANEOUS) ×8 IMPLANT
SEAL XI 5MM-8MM UNIVERSAL (MISCELLANEOUS) ×4
SEALER VESSEL DA VINCI XI (MISCELLANEOUS) ×1
SEALER VESSEL EXT DVNC XI (MISCELLANEOUS) ×2 IMPLANT
SLEEVE ADV FIXATION 5X100MM (TROCAR) IMPLANT
SOLUTION ELECTROLUBE (MISCELLANEOUS) ×3 IMPLANT
SPONGE GAUZE 2X2 STER 10/PKG (GAUZE/BANDAGES/DRESSINGS) ×1
STAPLER 45 BLU RELOAD XI (STAPLE) IMPLANT
STAPLER 45 BLUE RELOAD XI (STAPLE)
STAPLER 45 GREEN RELOAD XI (STAPLE) ×2
STAPLER 45 GRN RELOAD XI (STAPLE) ×4 IMPLANT
STAPLER CANNULA SEAL DVNC XI (STAPLE) ×2 IMPLANT
STAPLER CANNULA SEAL XI (STAPLE) ×1
STAPLER ECHELON POWER CIR 31 (STAPLE) ×1 IMPLANT
STAPLER SHEATH (SHEATH) ×1
STAPLER SHEATH ENDOWRIST DVNC (SHEATH) ×2 IMPLANT
SURGILUBE 2OZ TUBE FLIPTOP (MISCELLANEOUS) ×3 IMPLANT
SUT MNCRL AB 4-0 PS2 18 (SUTURE) ×3 IMPLANT
SUT PDS AB 1 CTX 36 (SUTURE) IMPLANT
SUT PDS AB 1 TP1 96 (SUTURE) IMPLANT
SUT PROLENE 0 CT 2 (SUTURE) IMPLANT
SUT PROLENE 2 0 KS (SUTURE) ×1 IMPLANT
SUT PROLENE 2 0 SH DA (SUTURE) IMPLANT
SUT SILK 2 0 (SUTURE)
SUT SILK 2 0 SH CR/8 (SUTURE) IMPLANT
SUT SILK 2-0 18XBRD TIE 12 (SUTURE) IMPLANT
SUT SILK 3 0 (SUTURE) ×1
SUT SILK 3 0 SH CR/8 (SUTURE) ×3 IMPLANT
SUT SILK 3-0 18XBRD TIE 12 (SUTURE) ×2 IMPLANT
SUT V-LOC BARB 180 2/0GR6 GS22 (SUTURE)
SUT VIC AB 3-0 SH 18 (SUTURE) IMPLANT
SUT VIC AB 3-0 SH 27 (SUTURE)
SUT VIC AB 3-0 SH 27XBRD (SUTURE) IMPLANT
SUT VICRYL 0 UR6 27IN ABS (SUTURE) ×3 IMPLANT
SUTURE V-LC BRB 180 2/0GR6GS22 (SUTURE) IMPLANT
SYR 10ML LL (SYRINGE) ×3 IMPLANT
SYS LAPSCP GELPORT 120MM (MISCELLANEOUS)
SYSTEM LAPSCP GELPORT 120MM (MISCELLANEOUS) IMPLANT
TAPE UMBILICAL COTTON 1/8X30 (MISCELLANEOUS) ×3 IMPLANT
TOWEL OR NON WOVEN STRL DISP B (DISPOSABLE) ×3 IMPLANT
TRAY FOLEY CATH 14FRSI W/METER (CATHETERS) ×1 IMPLANT
TRAY FOLEY MTR SLVR 16FR STAT (SET/KITS/TRAYS/PACK) ×2 IMPLANT
TROCAR ADV FIXATION 5X100MM (TROCAR) ×3 IMPLANT
TUBING CONNECTING 10 (TUBING) ×6 IMPLANT
TUBING INSUFFLATION 10FT LAP (TUBING) ×3 IMPLANT

## 2018-05-22 NOTE — Transfer of Care (Signed)
Immediate Anesthesia Transfer of Care Note  Patient: Morgan Clay  Procedure(s) Performed: XI ROBOTIC ASSISTED SIGMOID COLOECTOMY MOBILIZATION OF SPLENIC FLEXURE, FIREFLY ASSESSMENT OF PERFUSION (N/A Abdomen) RIGID PROCTOSCOPY (N/A Rectum)  Patient Location: PACU  Anesthesia Type:General  Level of Consciousness: awake, alert , oriented and patient cooperative  Airway & Oxygen Therapy: Patient Spontanous Breathing and Patient connected to face mask oxygen  Post-op Assessment: Report given to RN, Post -op Vital signs reviewed and stable and Patient moving all extremities  Post vital signs: Reviewed and stable  Last Vitals:  Vitals Value Taken Time  BP 150/79 05/22/2018 12:39 PM  Temp    Pulse 60 05/22/2018 12:40 PM  Resp 13 05/22/2018 12:40 PM  SpO2 99 % 05/22/2018 12:40 PM  Vitals shown include unvalidated device data.  Last Pain:  Vitals:   05/22/18 0652  TempSrc: Oral  PainSc:          Complications: No apparent anesthesia complications

## 2018-05-22 NOTE — H&P (Signed)
Morgan Clay DOB: March 16, 1946 Single / Language: Morgan Clay / Race: Black or African American Female  Patient Care Team: [name], DO as PCP - General (Family Medicine) [name], MD as PCP - Cardiology (Cardiology) [name], MD as Consulting Physician (Nephrology) [name], MD as Consulting Physician (Cardiology) [name], MD as Consulting Physician (Gastroenterology)  [name] ` Patient sent for surgical consultation for follow-up for diverticulitis with abscess.  ` ` The patient is a pleasant with numerous health issues. Cardiac dysrhythmia with cardioversion and pacemaker. Heart failure improved. Was on Eliquis blood thinner now off. At episodes of diverticulitis. Mild attacks in the past. Was in the hospital in the spring with a more severe attack in March of this year. Follow-up with gastroenterology. Colonoscopy revealed diverticulosis no other abnormalities. Had a recurrent attack with an abscess. Admitted on antibiotics. She comes here for follow-up. She had a repeat CAT scan which of the abscess slightly smaller but not resolved. Interloop/central. No good window for drainage. Smaller and 4 cm. Trying to manage with antibiotics. She comes in today with her daughter. Her appetite is down but she is keeping a food down. Has small frequent bowel movements. Not trying to slow down. Usually has constipation issues. Has not tried any Imodium. Has been sticking mainly to a liquid diet. Soreness has gone down. Energy level slightly improving but not great. Has chronic kidney disease followed by Dr. Edrick Clay. Followed by Dr. Aundra Clay & Morgan Clay Comes for her dysrhythmias and other cardiac issues. Cleared for surgery from their standpoint. No longer on blood thinners.  No personal nor family history of GI/colon cancer, inflammatory bowel disease, irritable bowel syndrome, allergy such as Celiac Sprue, dietary/dairy problems, colitis, ulcers  nor gastritis. No recent sick contacts/gastroenteritis. No travel outside the country. No changes in diet. No dysphagia to solids or liquids. No significant heartburn or reflux. No hematochezia, hematemesis, coffee ground emesis. No evidence of prior gastric/peptic ulceration. Some question of liver changes and cirrhosis but no history of portal hypertension or gastric bleeding. Had elevated bilirubin once. Otherwise has been normal. No varices seen in July. History of gout.  (Review of systems as stated in this history (HPI) or in the review of systems. Otherwise all other 12 point ROS are negative) ` ` ` CLINICAL DATA: Diverticulitis, follow-up; history chronic diastolic CHF, non ischemic cardiomyopathy, type II diabetes mellitus, COPD, hypertension  EXAM: CT ABDOMEN AND PELVIS WITHOUT CONTRAST  TECHNIQUE: Multidetector CT imaging of the abdomen and pelvis was performed following the standard protocol without IV contrast. Sagittal and coronal MPR images reconstructed from axial data set. No oral contrast was administered.  COMPARISON: 03/04/2018  FINDINGS: Lower chest: Small RIGHT pleural effusion. Infiltrates identified at bases of RIGHT middle and RIGHT lower lobes. Enlargement of cardiac silhouette with pacemaker leads at RIGHT atrium, RIGHT ventricle and coronary sinus. Heart enlarged.  Hepatobiliary: Nodular hepatic margins compatible with cirrhosis. Dilated IVC and hepatic veins consistent with passive congestion. Gallbladder unremarkable. No focal hepatic mass lesions.  Pancreas: Normal appearance  Spleen: Normal size and appearance. Probable tiny splenule inferior to spleen.  Adrenals/Urinary Tract: Thickening of adrenal glands without mass. Ptotic RIGHT kidney. No definite renal mass or hydronephrosis. Ureters and bladder unremarkable.  Stomach/Bowel: Appendix surgically absent by history. Medication tablet within stomach. Stomach and small  bowel loops otherwise normal appearance. Diverticulosis of descending and sigmoid colon with mild sigmoid wall thickening favor due to sigmoid diverticulitis. Small associated gas/fluid collection between sigmoid colon and small  bowel with a thick surrounding wall, overall 2.3 x 2.4 x 2.2 cm, containing slightly more gas than on the prior study and overall slightly smaller in size. Remainder of colon unremarkable.  Vascular/Lymphatic: Atherosclerotic calcifications aorta without aneurysm. No adenopathy.  Reproductive: Calcified uterine leiomyomata largest 5.2 x 5.0 x 5.8 cm. Unremarkable adnexa.  Other: No free air or free fluid. Small LEFT inguinal hernia containing fat and minimal fluid.  Musculoskeletal: Mild facet degenerative changes lumbar spine. No acute osseous findings.  IMPRESSION: Sigmoid diverticulosis with persistent wall thickening of sigmoid colon and adjacent infiltrative changes between sigmoid colon and small bowel mesenteric most consistent with sigmoid diverticulitis.  Small adjacent abscess collection cranial anterior to the sigmoid colon 2.3 x 2.4 x 2.2 cm in size, slightly decreased in size since previous exam.  Probable cirrhotic liver.  Enlargement of cardiac chambers post pacemaker with passive congestion of liver.  Calcified uterine leiomyomata.  Small LEFT inguinal hernia containing fat and minimal fluid.   Electronically Signed By: Morgan Clay M.D. On: 03/13/2018 08:23   Past Surgical History Morgan Clay, [place]; 03/18/2018 10:07 AM) Appendectomy  Diagnostic Studies History Morgan Clay, [place]; 03/18/2018 10:07 AM) Mammogram within last year Pap Smear 1-5 years ago  Allergies Morgan Clay, [place]; 03/18/2018 10:07 AM) No Known Drug Allergies [03/18/2018]: Allergies Reconciled  Medication History Morgan Clay, CMA; 03/18/2018 10:09 AM) Amiodarone HCl (200MG  Tablet, Oral) Active. Levothyroxine Sodium  (125MCG Tablet, Oral) Active. Nystatin (100000 UNIT/ML Suspension, Mouth/Throat) Active. Dicyclomine HCl (10MG  Capsule, Oral) Active. metroNIDAZOLE (500MG  Tablet, Oral) Active. metOLazone (2.5MG  Tablet, Oral) Active. Colchicine (0.6MG  Tablet, Oral) Active. Ciprofloxacin HCl (500MG  Tablet, Oral) Active. Carvedilol (25MG  Tablet, Oral) Active. Allopurinol (100MG  Tablet, Oral) Active. oxyCODONE HCl (5MG  Tablet, Oral) Active. Torsemide (20MG  Tablet, Oral) Active. Potassium Chloride ER (20MEQ Tablet ER, Oral) Active. Sodium Bicarbonate (650MG  Tablet, Oral) Active. Amoxicillin-Pot Clavulanate (875-125MG  Tablet, 1 (one) Oral two times daily, Taken starting 03/14/2018) Active. Medications Reconciled  Social History Morgan Clay, CMA; 03/18/2018 10:07 AM) Alcohol use Moderate alcohol use. Caffeine use Carbonated beverages, Coffee.  Family History Morgan Clay, [place]; 03/18/2018 10:07 AM) Alcohol Abuse Brother, Daughter, Father, Mother, Sister. Cancer Sister. Heart Disease Father, Mother. Respiratory Condition Sister. Thyroid problems Sister.  Pregnancy / Birth History Morgan Clay, [place]; 03/18/2018 10:07 AM) Durenda Age 4 Maternal age 59-20 Para 2  Other Problems Morgan Clay, [place]; 03/18/2018 10:07 AM) Chronic Renal Failure Syndrome Gastroesophageal Reflux Disease High blood pressure Thyroid Disease     Review of Systems Morgan Clay CMA; 03/18/2018 10:07 AM) General Present- Appetite Loss. Not Present- Chills, Fatigue, Fever, Night Sweats, Weight Gain and Weight Loss. Skin Not Present- Change in Wart/Mole, Dryness, Hives, Jaundice, New Lesions, Non-Healing Wounds, Rash and Ulcer. HEENT Present- Wears glasses/contact lenses. Not Present- Earache, Hearing Loss, Hoarseness, Nose Bleed, Oral Ulcers, Ringing in the Ears, Seasonal Allergies, Sinus Pain, Sore Throat, Visual Disturbances and Yellow Eyes. Respiratory Present- Difficulty Breathing. Not  Present- Bloody sputum, Chronic Cough, Snoring and Wheezing. Cardiovascular Present- Shortness of Breath. Not Present- Chest Pain, Difficulty Breathing Lying Down, Leg Cramps, Palpitations, Rapid Heart Rate and Swelling of Extremities. Gastrointestinal Present- Abdominal Pain, Chronic diarrhea and Gets full quickly at meals. Not Present- Bloating, Bloody Stool, Change in Bowel Habits, Constipation, Difficulty Swallowing, Excessive gas, Hemorrhoids, Indigestion, Nausea, Rectal Pain and Vomiting. Female Genitourinary Not Present- Frequency, Nocturia, Painful Urination, Pelvic Pain and Urgency. Neurological Not Present- Decreased Memory, Fainting, Headaches, Numbness, Seizures, Tingling, Tremor, Trouble walking and Weakness. Psychiatric Not Present- Anxiety, Bipolar, Change in Sleep Pattern, Depression, Fearful and Frequent  crying. Hematology Not Present- Blood Thinners, Easy Bruising, Excessive bleeding, Gland problems, HIV and Persistent Infections.  Vitals Morgan Clay CMA; 03/18/2018 10:10 AM) 03/18/2018 10:09 AM Weight: 147.38 lb Height: 59in Body Surface Area: 1.62 m Body Mass Index: 29.77 kg/m  Temp.: 97.15F(Oral)  Pulse: 65 (Regular)  BP: 132/84 (Sitting, Left Arm, Standard)   BP (!) 159/69   Pulse 60   Temp 97.7 F (36.5 C) (Oral)   Ht 4\' 11"  (1.499 m)   Wt 64.4 kg   SpO2 100%   BMI 28.68 kg/m     Physical Exam Adin Hector MD; 03/18/2018 10:43 AM)  General Mental Status-Alert. General Appearance-Not in acute distress, Not Sickly. Orientation-Oriented X3. Hydration-Well hydrated. Voice-Normal.  Integumentary Global Assessment Upon inspection and palpation of skin surfaces of the - Axillae: non-tender, no inflammation or ulceration, no drainage. and Distribution of scalp and body hair is normal. General Characteristics Temperature - normal warmth is noted.  Head and Neck Head-normocephalic, atraumatic with no lesions or  palpable masses. Face Global Assessment - atraumatic, no absence of expression. Neck Global Assessment - no abnormal movements, no bruit auscultated on the right, no bruit auscultated on the left, no decreased range of motion, non-tender. Trachea-midline. Thyroid Gland Characteristics - non-tender.  Eye Eyeball - Left-Extraocular movements intact, No Nystagmus. Eyeball - Right-Extraocular movements intact, No Nystagmus. Cornea - Left-No Hazy. Cornea - Right-No Hazy. Sclera/Conjunctiva - Left-No scleral icterus, No Discharge. Sclera/Conjunctiva - Right-No scleral icterus, No Discharge. Pupil - Left-Direct reaction to light normal. Pupil - Right-Direct reaction to light normal.  ENMT Ears Pinna - Left - no drainage observed, no generalized tenderness observed. Right - no drainage observed, no generalized tenderness observed. Nose and Sinuses External Inspection of the Nose - no destructive lesion observed. Inspection of the nares - Left - quiet respiration. Right - quiet respiration. Mouth and Throat Lips - Upper Lip - no fissures observed, no pallor noted. Lower Lip - no fissures observed, no pallor noted. Nasopharynx - no discharge present. Oral Cavity/Oropharynx - Tongue - no dryness observed. Oral Mucosa - no cyanosis observed. Hypopharynx - no evidence of airway distress observed.  Chest and Lung Exam Inspection Movements - Normal and Symmetrical. Accessory muscles - No use of accessory muscles in breathing. Palpation Palpation of the chest reveals - Non-tender. Auscultation Breath sounds - Normal and Clear.  Cardiovascular Auscultation Rhythm - Regular. Murmurs & Other Heart Sounds - Auscultation of the heart reveals - No Murmurs and No Systolic Clicks.  Abdomen Inspection Inspection of the abdomen reveals - No Visible peristalsis and No Abnormal pulsations. Umbilicus - No Bleeding, No Urine drainage. Palpation/Percussion Palpation and Percussion  of the abdomen reveal - Soft, Non Tender, No Rebound tenderness, No Rigidity (guarding) and No Cutaneous hyperesthesia. Note: Abdomen overweight but soft. Mild central and left-sided abdominal discomfort but no guarding. Improved when she was in the hospital. No distasis recti. No umbilical or other anterior abdominal wall hernias  Female Genitourinary Sexual Maturity Tanner 5 - Adult hair pattern. Note: No vaginal bleeding nor discharge  Peripheral Vascular Upper Extremity Inspection - Left - No Cyanotic nailbeds, Not Ischemic. Right - No Cyanotic nailbeds, Not Ischemic.  Neurologic Neurologic evaluation reveals -normal attention span and ability to concentrate, able to name objects and repeat phrases. Appropriate fund of knowledge , normal sensation and normal coordination. Mental Status Affect - not angry, not paranoid. Cranial Nerves-Normal Bilaterally. Gait-Normal.  Neuropsychiatric Mental status exam performed with findings of-able to articulate well with normal speech/language, rate, volume  and coherence, thought content normal with ability to perform basic computations and apply abstract reasoning and no evidence of hallucinations, delusions, obsessions or homicidal/suicidal ideation.  Musculoskeletal Global Assessment Spine, Ribs and Pelvis - no instability, subluxation or laxity. Right Upper Extremity - no instability, subluxation or laxity.  Lymphatic Head & Neck  General Head & Neck Lymphatics: Bilateral - Description - No Localized lymphadenopathy. Axillary  General Axillary Region: Bilateral - Description - No Localized lymphadenopathy. Femoral & Inguinal  Generalized Femoral & Inguinal Lymphatics: Left - Description - No Localized lymphadenopathy. Right - Description - No Localized lymphadenopathy.    Assessment & Plan   DIVERTICULITIS OF INTESTINE WITH ABSCESS (K57.80) Impression: Persistent diverticulitis with a second hospitalization  this year. Now with a chronic but small abscess. Still with some symptoms.  Stabilized & ready for surgery  I suspect she's reaching end-stage diverticulitis and will not improve until she gets colectomy done at some point. Ideally we would wait another 6-8 weeks to allow inflammation to be minimal, abscess to possibly resolve. Make it more likely that we can do a minimally invasive approach. Robotic resection with anastomosis. Try to avoid a colostomy.  She's been cleared by cardiology.  She had a colonoscopy in the summer which showed diverticulosis but no other mass or tumor.  She is followed by Dr. Justin Mend with nephrology. Chronic kidney disease significant but stable.  While she is increased operative risks with her numerous health issues, I think we can minimize it if we can allow this attack to be is minimal possible and get her to a stronger place before doing elective surgery. Plan early January. D/w with the patient and her daughter. Questions answered. They agreed to proceed.   The anatomy & physiology of the digestive tract was discussed. The pathophysiology of the colon was discussed. Natural history risks without surgery was discussed. I feel the risks of no intervention will lead to serious problems that outweigh the operative risks; therefore, I recommended a partial colectomy to remove the pathology. Minimally invasive (Robotic/Laparoscopic) & open techniques were discussed.  Risks such as bleeding, infection, abscess, leak, reoperation, possible ostomy, hernia, heart attack, death, and other risks were discussed. I noted a good likelihood this will help address the problem. Goals of post-operative recovery were discussed as well. Need for adequate nutrition, daily bowel regimen and healthy physical activity, to optimize recovery was noted as well. We will work to minimize complications. Educational materials were available as well. Questions were answered. The  patient expresses understanding & wishes to proceed with surgery.  Pt Education - [facility] Colon Bowel Prep 2018 ERAS/Miralax/Antibiotics Started Neomycin Sulfate 500 MG Oral Tablet, 2 (two) Tablet SEE NOTE, #6, 03/18/2018, No Refill. Local Order: TAKE TWO TABLETS AT 2 PM, 3 PM, AND 10 PM THE DAY PRIOR TO SURGERY Pt Education - Pamphlet Given - Laparoscopic Colorectal Surgery: discussed with patient and provided information. Pt Education - [facility] Colectomy post-op instructions: discussed with patient and provided information.  Adin Hector, MD, FACS, MASCRS Gastrointestinal and Minimally Invasive Surgery    [address], [place], [place] [address] [phone] Main / Paging [phone] Fax

## 2018-05-22 NOTE — Anesthesia Procedure Notes (Signed)
Procedure Name: Intubation Date/Time: 05/22/2018 8:40 AM Performed by: Mitzie Na, CRNA Pre-anesthesia Checklist: Patient identified, Emergency Drugs available, Suction available, Patient being monitored and Timeout performed Patient Re-evaluated:Patient Re-evaluated prior to induction Oxygen Delivery Method: Circle system utilized Preoxygenation: Pre-oxygenation with 100% oxygen Induction Type: IV induction Ventilation: Mask ventilation without difficulty and Oral airway inserted - appropriate to patient size Laryngoscope Size: Mac and 3 Grade View: Grade I Tube type: Oral Tube size: 7.5 mm Number of attempts: 1 Airway Equipment and Method: Stylet Placement Confirmation: positive ETCO2,  ETT inserted through vocal cords under direct vision and breath sounds checked- equal and bilateral Secured at: 24 cm Tube secured with: Tape Dental Injury: Teeth and Oropharynx as per pre-operative assessment

## 2018-05-22 NOTE — Anesthesia Postprocedure Evaluation (Signed)
Anesthesia Post Note  Patient: Morgan Clay  Procedure(s) Performed: XI ROBOTIC ASSISTED SIGMOID COLOECTOMY MOBILIZATION OF SPLENIC FLEXURE, FIREFLY ASSESSMENT OF PERFUSION (N/A Abdomen) RIGID PROCTOSCOPY (N/A Rectum)     Patient location during evaluation: PACU Anesthesia Type: General Level of consciousness: awake and alert Pain management: pain level controlled Vital Signs Assessment: post-procedure vital signs reviewed and stable Respiratory status: spontaneous breathing, nonlabored ventilation, respiratory function stable and patient connected to nasal cannula oxygen Cardiovascular status: blood pressure returned to baseline and stable Postop Assessment: no apparent nausea or vomiting Anesthetic complications: no    Last Vitals:  Vitals:   05/22/18 1530 05/22/18 1607  BP:  (!) 150/67  Pulse:  60  Resp: 15 14  Temp:  36.7 C  SpO2:  99%    Last Pain:  Vitals:   05/22/18 1530  TempSrc:   PainSc: Asleep                 Montez Hageman

## 2018-05-22 NOTE — Op Note (Signed)
05/22/2018  12:59 PM  PATIENT:  Morgan Clay  73 y.o. female  Patient Care Team: [name], DO as PCP - General (Family Medicine) [name], MD as PCP - Cardiology (Cardiology) [name], MD as Consulting Physician (Nephrology) [name], MD as Consulting Physician (Cardiology) [name], MD as Consulting Physician (Gastroenterology)  PRE-OPERATIVE DIAGNOSIS:  Diverticulitis  POST-OPERATIVE DIAGNOSIS: Recurrent sigmoid diverticulitis with abscess  PROCEDURE: XI ROBOTIC ASSISTED LOW ANTERIOR RECTOSIGMOID RESECTION MOBILIZATION OF SPLENIC FLEXURE OF COLON FIREFLY ASSESSMENT OF PERFUSION RIGID PROCTOSCOPY  SURGEON:  Adin Hector, MD  ASSISTANT: Carlena Hurl, PA-C Leighton Ruff, MD, FACS.   ANESTHESIA:   local and general  EBL:  Total I/O In: 1300 [I.V.:1200; IV Piggyback:100] Out: 225 [Urine:125; Blood:100]  Delay start of Pharmacological VTE agent (>24hrs) due to surgical blood loss or risk of bleeding:  no  DRAINS: No  SPECIMEN: Rectosigmoid colon.  Open end proximal at the level of mid descending colon.  DISPOSITION OF SPECIMEN:  PATHOLOGY  COUNTS:  YES  PLAN OF CARE: Admit to inpatient   PATIENT DISPOSITION:  PACU - hemodynamically stable.  INDICATION:    Patient with episodes recurrent sigmoid diverticulitis.  Last admission with abscess that took a long time to heal with oral antibiotics.  No other endoluminal abnormalities.  Given the complex attack and prior attacks, I recommended Segmental resection:  The anatomy & physiology of the digestive tract was discussed.  The pathophysiology was discussed.  Natural history risks without surgery was discussed.   I worked to give an overview of the disease and the frequent need to have multispecialty involvement.  I feel the risks of no intervention will lead to serious problems that outweigh the operative risks; therefore, I recommended a partial colectomy to remove the pathology.   Laparoscopic & open techniques were discussed.   Risks such as bleeding, infection, abscess, leak, reoperation, possible ostomy, hernia, heart attack, death, and other risks were discussed.  I noted a good likelihood this will help address the problem.   Goals of post-operative recovery were discussed as well.  We will work to minimize complications.  Educational materials on the pathology had been given in the office.  Questions were answered.    The patient expressed understanding & wished to proceed with surgery.  OR FINDINGS:   Patient had small bowel adhesions to proximal sigmoid colon consistent with the sigmoid diverticulitis.  Foreshortened left colon.  Fibroid uterus with dilated and large blood supply especially left gonadal's  No obvious metastatic disease on visceral parietal peritoneum or liver.  The anastomosis rests 9 cm from the anal verge by rigid proctoscopy.  DESCRIPTION:   Informed consent was confirmed.  The patient underwent general anaesthesia without difficulty.  The patient was positioned appropriately.  VTE prevention in place.  The patient was clipped, prepped, & draped in a sterile fashion.  Surgical timeout confirmed our plan.  The patient was positioned in reverse Trendelenburg.  Abdominal entry was gained using Varess technique at the left subcostal ridge on the anterior abdominal wall.  No elevated EtCO2 noted.  Port placed.  Camera inspection revealed no injury.  Extra ports were carefully placed under direct laparoscopic visualization.  Patient had greater omental adhesions periumbilical lobe midline that were left in situ.  The patient was carefully positioned.  The Intuitive daVinci robot was docked with camera & instruments carefully placed.  I freed adhesions greater omentum off the anterior abdominal wall.  I free adhesions between the small  bowel to the left colon.  At this point they were rather wispy although there were some thickened areas.  With  dissection and removal of small bowel and greater omentum off the left colon, I could better identify  the rectosigmoid colon.  Sigmoid colon was thickened with chronic inflammation.  Seemed actually more in the proximal colon near the descending/sigmoid junction.  I scored the base of peritoneum of the medial side of the mesentery of the elevated left colon from the ligament of Treitz to the peritoneal reflection of the mid rectum.   I elevated the sigmoid mesentery and entered into the retro-mesenteric plane. We were able to identify the left ureter and gonadal vessels. We kept those posterior within the retroperitoneum and elevated the left colon mesentery off that. I did isolate the inferior mesenteric artery (IMA) pedicle but did not ligate it yet.  I continued distally and got into the avascular plane posterior to the mesorectum. This allowed me to help mobilize the rectum as well by freeing the mesorectum off the sacrum.  I mobilized the peritoneal coverings towards the peritoneal reflection on both the right and left sides of the rectum.  I stayed away from the right and left ureters.  I kept the lateral vascular pedicles to the rectum intact.  Patient had very dilated retroperitoneal vessels that ended up being gonadal in nature.  Enlargement dilated given her fibroid and enlarged uterus.  Eventually was able to mobilize and keep them in the retroperitoneal position.  Took care to keep the left ureter out of the way.  Mobilized up towards the splenic flexure of the colon.  I skeletonized the lymph nodes off the inferior mesenteric artery pedicle.  I went down to its takeoff from the aorta.  I isolated the inferior mesenteric vein off of the ligament of Treitz just cephalad to that as well.  After confirming the left ureter was out of the way, I went ahead and ligated the inferior mesenteric artery pedicle just near its takeoff from the aorta.  I did ligate the inferior mesenteric vein in a similar  fashion.  We ensured hemostasis. I skeletonized the mesorectum at the junction at the proximal rectum for the distal point of resection.  I mobilized the left colon in a lateral to medial fashion off the line of Toldt up towards the splenic flexure to ensure good mobilization of the remaining left colon to reach into the pelvis.  I elevated the proximal rectum off the sacral promontory and mobilize the rectum off the presacral space.  And come down closer to the mid rectal junction where it was not inflamed or thickened.  Took some lateral visceral peritoneal adhesions down to help untwist and straighten out the rectum.  Chose an area in the proximal rectum and transected the mesorectum carefully.  Skeletonized at the proximal mesorectum and transected at the proximal rectum using a robotic 45 mm stapler.    Left colon would not reach down into the pelvis however.  I ended up having to do splenic flexure mobilization.  I transected the greater omentum off the mid transverse colon and headed more distally towards the splenic flexure.  I mobilized the left colon off the retroperitoneum.  The kidney 1 to come up with it but eventually was able to find a plane between Gerota's fascia and the left colon and reduce the kidney back down.  Transected retroperitoneal attachments around the splenic flexure and inferior pancreatic ridge.  Transected some of the splenic flexure  mesentery near the retroperitoneum to better mobilize the colon.  With that, I had complete mobilization of the left colon from the middle colic pedicles and the mid transverse colon.  This allowed much better mobilization.  I chose a region at the mid descending colon that was soft and easily reached down to the rectal stump.  I transected the mesentery of the colon radially to preserve remaining colon blood supply.  Because of the dissection and mobilization and thin mesentery, inside assess perfusion with firefly immunofluorescence.  Anesthesia  intravenously injected firefly.  Was able to see good green immunofluorescent perfusion under appropriate robotic window.  Perfusion came down and stopped about 3 cm proximal to my preselected region.  I therefore transected the mesentery and skeletonized until I had healthy perfusion of mid descending colon.  Expected the abdomen.  There had been some oozing on the great omentum but I assured hemostasis.  Retroperitoneum was clear.  Ureter clean.  I created an extraction incision through a small Pfannenstiel incision in the suprapubic region.  Placed a wound protector.  I was able to eviscerate the rectosigmoid and descending colon out the wound.   I clamped the colon proximal to this area using a reusable pursestringer device.  Passed a 2-0 Keith needle. I transected at the descending/sigmoid junction with a scalpel. I got healthy bleeding mucosa.  We sent the rectosigmoid colon specimen off to go to pathology.  We sized the colon orifice.  I chose a 31 EEA Ethicon powered stapler system.  I reinforced the prolene pursestring with interrupted silk suture.  I placed the anvil to the open end of the proximal remaining colon and closed around it using the pursestring.    We did copious irrigation with crystalloid solution.  Hemostasis was good.  The distal end of the remaining colon easily reached down to the rectal stump, therefore, further colon mobilization was not needed.      Ms Lemar Lofty & Dr Marcello Moores scrubbed down and did gentle anal dilation and advanced the EEA stapler up the rectal stump. The spike was brought out at the provimal end of the rectal stump under direct visualization.  I attached the anvil of the proximal colon the spike of the stapler. Anvil was tightened down and held clamped for 60 seconds. The EEA stapler was fired and held clamped for 30 seconds. The stapler was released & removed. We noted 2 excellent anastomotic rings. Blue stitch is in the proximal ring.  Ms Lemar Lofty did rigid proctoscopy  noted the anastomosis was at 10 cm from the anal verge consistent with the proximal rectum.  We did a final irrigation of antibiotic solution (900 mg clindamycin/240 mg gentamicin in a liter of crystalloid) & held that for the pelvic air leak test .  The rectum was insufflated the rectum while clamping the colon proximal to that anastomosis.  There was a negative air leak test. There was no tension of mesentery or bowel at the anastomosis.   Tissues looked viable.  Ureters & bowel uninjured.  The anastomosis looked healthy.  Endoluminal gas was evacuated.  Ports & wound protector removed.  We changed gloves & redraped the patient per colon SSI prevention protocol.  We aspirated the antibiotic irrigation.  Inspected the greater omentum.  I closed some defects with interrupted silk suture and ensured hemostasis.  Allow the greater omentum to go down the pelvis to help protect the anastomosis between the fibroid uterus and colorectal EEA anastomosis.  Hemostasis was improved.  Sterile  unused instruments were used from this point.  I closed the skin at the port sites using Monocryl stitch and sterile dressing.  I closed the extraction wound using a 0 Vicryl vertical peritoneal closure and a #1 PDS transverse anterior rectal fascial closure like a small Pfannenstiel closure. I closed the skin with some interrupted Monocryl stitches. I placed antibiotic-soaked wicks into the closure at the corners x2.  I placed sterile dressings.     Patient is being extubated go to recovery room. I had discussed postop care with the patient in detail the office & in the holding area. Instructions are written. I made an attempt to locate family to discuss patient's status and recommendations.  No one is available at this time.  I will try again later  Adin Hector, M.D., F.A.C.S. Gastrointestinal and Minimally Invasive Surgery [facility] [address] [place], [place] [address] [phone] Main / Paging

## 2018-05-22 NOTE — Interval H&P Note (Signed)
History and Physical Interval Note:  05/22/2018 8:14 AM  Morgan Clay  has presented today for surgery, with the diagnosis of Diverticulitis  The various methods of treatment have been discussed with the patient and family. After consideration of risks, benefits and other options for treatment, the patient has consented to  Procedure(s): XI ROBOTIC [place] (N/A) RIGID PROCTOSCOPY (N/A) as a surgical intervention .  The patient's history has been reviewed, patient examined, no change in status, stable for surgery.  I have reviewed the patient's chart and labs.  Questions were answered to the patient's satisfaction.    I have re-reviewed the the patient's records, history, medications, and allergies.  I have re-examined the patient.  I again discussed intraoperative plans and goals of post-operative recovery.  The patient agrees to proceed.  Morgan Clay  01-Nov-1945 [id number]  Patient Care Team: [name], DO as PCP - General (Family Medicine) [name], MD as PCP - Cardiology (Cardiology) [name], MD as Consulting Physician (Nephrology) [name], MD as Consulting Physician (Cardiology) [name], MD as Consulting Physician (Gastroenterology)  Patient Active Problem List   Diagnosis Date Noted  . CKD (chronic kidney disease) stage 4, GFR 15-29 ml/min  11/24/2013    Priority: Medium  . Nonischemic cardiomyopathy ([place]) 11/22/2010    Priority: Medium  . Thrush 03/06/2018  . RLQ abdominal pain 02/26/2018  . LLQ abdominal pain 02/26/2018  . Diarrhea 02/26/2018  . Chills 02/26/2018  . Anemia in chronic kidney disease 01/18/2018  . Pulmonary hypertension ([place]) on echocardiogram 01/14/2018  . Angiodysplasia of intestinal tract 01/14/2018  . GI bleeding 01/03/2018  . Heme positive stool 12/12/2017  . Polyp of transverse colon   . Diverticulosis of colon without hemorrhage   . Melena 11/06/2017  . Anemia  10/25/2017  . Atrial fibrillation, chronic 10/25/2017  . Aortic atherosclerosis ([place]) 08/30/2017  . Pulmonary edema, acute ([place]) 08/09/2017  . Pain and swelling of left upper extremity 08/08/2017  . ARF (acute renal failure) ([place])   . Enteritis   . Partial small bowel obstruction ([place])   . Diverticulitis of sigmoid colon with abscess 07/31/2017  . Hypertension associated with diabetes ([place]) 06/07/2017  . MDD (major depressive disorder), recurrent, in partial remission ([place]) 06/07/2017  . Asthma 05/22/2017  . Chronic obstructive pulmonary disease ([place])   . Atypical atrial flutter ([place]) 09/28/2015  . Type 2 diabetes, controlled, with renal manifestation ([place]) 11/24/2013  . Obesity 05/27/2009  . OSTEOPENIA 09/30/2008  . OBSTRUCTIVE SLEEP APNEA 11/14/2007  . Chronic diastolic CHF (congestive heart failure) ([place]) 04/24/2007  . Hypothyroidism 01/28/2007  . Biventricular ICD (implantable cardioverter-defibrillator) in place 01/08/2007    Past Medical History:  Diagnosis Date  . A-fib ([place])    on Eliquis  . AICD (automatic cardioverter/defibrillator) present 10/06/2015  . Anemia   . Arthritis    "qwhere" (01/03/2018)  . Asthma    reports mild asthma since childhood - had COPD on dx list from prior PCP  . Chronic bronchitis ([place])    "get it most years; not this past year though" (01/03/2018)  . Chronic kidney disease    "? stage;  followed by [facility]; [name]" (01/03/2018)  . Chronic systolic congestive heart failure ([place])   . DEPRESSION 12/17/2009   Annotation: PHQ-9 score = 14 done on 12/17/2009 Qualifier: Diagnosis of  By: Hassell Done FNP, Tori Milks    . Diabetes mellitus without complication (HCC)    DIET CONTROLLED   .  Diverticulosis   . FIBROIDS, UTERUS 03/05/2008  . Glaucoma   . Hepatitis C    HEPATITIS C - s/p treatment with Harvoni, saw hepatology, Dawn Drazek  . History of blood transfusion ~ 11/2017  . Hyperlipemia 12/06/2012  . HYPERTENSION, BENIGN 04/24/2007  .  Hypothyroidism   . LBBB (left bundle branch block)   . Lower GI bleeding    "been dealing w/it since 07/2017" (01/03/2018)  . Nonischemic cardiomyopathy ([place])   . OBESITY 05/27/2009  . OBSTRUCTIVE SLEEP APNEA 11/14/2007   no CPAP  . OSTEOPENIA 09/30/2008  . Personal history of other infectious and parasitic disease    Hepatitis B  . Pneumonia    "several times" (01/03/2018)    Past Surgical History:  Procedure Laterality Date  . APPENDECTOMY    . CARDIAC CATHETERIZATION    . CARDIOVERSION N/A 12/14/2014   Procedure: CARDIOVERSION;  Surgeon: Dorothy Spark, MD;  Location: [facility] ENDOSCOPY;  Service: Cardiovascular;  Laterality: N/A;  . CARDIOVERSION N/A 02/26/2017   Procedure: CARDIOVERSION;  Surgeon: Evans Lance, MD;  Location: [place] CV LAB;  Service: Cardiovascular;  Laterality: N/A;  . CARDIOVERSION N/A 07/24/2017   Procedure: CARDIOVERSION;  Surgeon: Thayer Headings, MD;  Location: [facility] ENDOSCOPY;  Service: Cardiovascular;  Laterality: N/A;  . COLONOSCOPY N/A 11/08/2017   Procedure: COLONOSCOPY;  Surgeon: Milus Banister, MD;  Location: [facility] ENDOSCOPY;  Service: Endoscopy;  Laterality: N/A;  . DILATION AND CURETTAGE OF UTERUS    . EP IMPLANTABLE DEVICE N/A 10/06/2015   Procedure: BIV ICD Generator Changeout;  Surgeon: [name], MD;  Location: [place] CV LAB;  Service: Cardiovascular;  Laterality: N/A;  . ESOPHAGOGASTRODUODENOSCOPY N/A 10/26/2017   Procedure: ESOPHAGOGASTRODUODENOSCOPY (EGD);  Surgeon: Ladene Artist, MD;  Location: [facility] ENDOSCOPY;  Service: Endoscopy;  Laterality: N/A;  . ESOPHAGOGASTRODUODENOSCOPY (EGD) WITH PROPOFOL N/A 11/07/2017   Procedure: ESOPHAGOGASTRODUODENOSCOPY (EGD) WITH PROPOFOL;  Surgeon: Milus Banister, MD;  Location: [facility] ENDOSCOPY;  Service: Endoscopy;  Laterality: N/A;  . HOT HEMOSTASIS N/A 10/26/2017   Procedure: HOT HEMOSTASIS (ARGON PLASMA COAGULATION/BICAP);  Surgeon: Ladene Artist, MD;  Location: [facility] ENDOSCOPY;  Service: Endoscopy;   Laterality: N/A;  . INSERT / REPLACE / REMOVE PACEMAKER  ?2008  . POLYPECTOMY  11/08/2017   Procedure: POLYPECTOMY;  Surgeon: Milus Banister, MD;  Location: [facility] ENDOSCOPY;  Service: Endoscopy;;  . RIGHT HEART CATH N/A 02/13/2018   Procedure: RIGHT HEART CATH;  Surgeon: [name], MD;  Location: [place] CV LAB;  Service: Cardiovascular;  Laterality: N/A;  . TUBAL LIGATION      Social History   Socioeconomic History  . Marital status: Divorced    Spouse name: Not on file  . Number of children: 1  . Years of education: 40  . Highest education level: Not on file  Occupational History  . Not on file  Social Needs  . Financial resource strain: Not hard at all  . Food insecurity:    Worry: Never true    Inability: Sometimes true  . Transportation needs:    Medical: No    Non-medical: No  Tobacco Use  . Smoking status: Never Smoker  . Smokeless tobacco: Never Used  Substance and Sexual Activity  . Alcohol use: Yes    Comment: 01/03/2018 "couple drinks/month"  . Drug use: Yes    Types: Marijuana    Comment: VERY INTERMITTENT   . Sexual activity: Not Currently  Lifestyle  . Physical activity:    Days per  week: Not on file    Minutes per session: Not on file  . Stress: Not on file  Relationships  . Social connections:    Talks on phone: Not on file    Gets together: Not on file    Attends religious service: Not on file    Active member of club or organization: Not on file    Attends meetings of clubs or organizations: Not on file    Relationship status: Not on file  . Intimate partner violence:    Fear of current or ex partner: Not on file    Emotionally abused: Not on file    Physically abused: Not on file    Forced sexual activity: Not on file  Other Topics Concern  . Not on file  Social History Narrative   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      Home Situation: lives alone      Spiritual Beliefs: Christian - [employer]      Lifestyle: no regular  exercise; poor diet      She is divorced at least one adult child   Never smoker    rare alcohol to occasional at best    no substance abuse          Family History  Problem Relation Age of Onset  . Asthma Father   . Heart attack Father   . Asthma Sister   . Lung cancer Sister   . Heart attack Mother   . Stroke Brother   . Colon cancer Neg Hx   . Esophageal cancer Neg Hx   . Pancreatic cancer Neg Hx   . Stomach cancer Neg Hx   . Liver disease Neg Hx     Medications Prior to Admission  Medication Sig Dispense Refill Last Dose  . allopurinol (ZYLOPRIM) 300 MG tablet Take 300 mg by mouth daily.   05/21/2018 at Unknown time  . BIDIL 20-37.5 MG tablet TAKE 1 TABLET BY MOUTH TWICE DAILY (Patient taking differently: Take 1 tablet by mouth 2 (two) times daily. ) 180 tablet 3 05/22/2018 at 0535  . carvedilol (COREG) 25 MG tablet Take 25 mg by mouth 2 (two) times daily.  3 05/22/2018 at 0535  . colchicine 0.6 MG tablet Take 0.6 mg by mouth daily as needed (gout flares).   0 05/21/2018 at Unknown time  . levothyroxine (SYNTHROID, LEVOTHROID) 125 MCG tablet Take 125 mcg by mouth daily before breakfast.   05/22/2018 at 0535  . oxyCODONE (OXY IR/ROXICODONE) 5 MG immediate release tablet Take 1 tablet (5 mg total) by mouth every 6 (six) hours as needed for moderate pain. 20 tablet 0 Past Month at Unknown time  . pantoprazole (PROTONIX) 40 MG tablet Take 1 tablet (40 mg total) by mouth daily. 30 tablet 4 05/22/2018 at 0535  . potassium chloride 20 MEQ TBCR Take 20 mEq by mouth daily. 30 tablet 3 05/21/2018 at Unknown time  . sertraline (ZOLOFT) 50 MG tablet Take 1.5 tablets (75 mg total) by mouth daily. 135 tablet 3 05/21/2018 at Unknown time  . sodium bicarbonate 650 MG tablet Take 1 tablet (650 mg total) by mouth 2 (two) times daily. (Patient taking differently: Take 650 mg by mouth daily. ) 60 tablet 0 05/21/2018 at Unknown time  . torsemide (DEMADEX) 20 MG tablet Take 1 tablet (20 mg total) by mouth  as needed (for 3 lb weight gain). (Patient taking differently: Take 40 mg by mouth daily. ) 30 tablet 0 05/20/2018 at Unknown  time  . albuterol (PROVENTIL HFA;VENTOLIN HFA) 108 (90 Base) MCG/ACT inhaler Inhale 2 puffs into the lungs every 6 (six) hours as needed for wheezing or shortness of breath. 1 Inhaler 3 More than a month at Unknown time  . alum & mag hydroxide-simeth (MAALOX/MYLANTA) 200-200-20 MG/5ML suspension Take 30 mLs by mouth every 6 (six) hours as needed for indigestion, heartburn or flatulence. (Patient not taking: Reported on 05/09/2018) 355 mL 0 Not Taking at Unknown time  . dicyclomine (BENTYL) 10 MG capsule Take 1 capsule (10 mg total) by mouth 3 (three) times daily before meals. (Patient not taking: Reported on 05/09/2018) 30 capsule 0 Not Taking at Unknown time  . hydrocortisone (ANUSOL-HC) 2.5 % rectal cream Apply 1 application topically 4 (four) times daily as needed for hemorrhoids. (Patient not taking: Reported on 05/09/2018) 30 g 0 Not Taking at Unknown time  . menthol-cetylpyridinium (CEPACOL) 3 MG lozenge Take 1 lozenge (3 mg total) by mouth as needed for sore throat (throat irritation / pain). (Patient not taking: Reported on 05/09/2018) 100 tablet 12 Not Taking at Unknown time  . nitroGLYCERIN (NITROSTAT) 0.4 MG SL tablet Place 0.4 mg under the tongue every 5 (five) minutes as needed for chest pain.   Unknown at Unknown time  . nystatin (MYCOSTATIN) 100000 UNIT/ML suspension Take 5 mLs (500,000 Units total) by mouth 4 (four) times daily. (Patient not taking: Reported on 05/09/2018) 60 mL 0 Not Taking at Unknown time  . polyethylene glycol (MIRALAX / GLYCOLAX) packet Take 17 g by mouth 2 (two) times daily. (Patient not taking: Reported on 05/09/2018) 60 each 2 Not Taking at Unknown time  . saccharomyces boulardii (FLORASTOR) 250 MG capsule Take 1 capsule (250 mg total) by mouth 2 (two) times daily. (Patient not taking: Reported on 05/09/2018) 30 capsule 0 Not Taking at Unknown time     Current Facility-Administered Medications  Medication Dose Route Frequency Provider Last Rate Last Dose  . bupivacaine liposome (EXPAREL) 1.3 % injection 266 mg  20 mL Infiltration Once Michael Boston, MD      . cefoTEtan (CEFOTAN) 2 g in sodium chloride 0.9 % 100 mL IVPB  2 g Intravenous On Call to OR Michael Boston, MD      . clindamycin (CLEOCIN) 900 mg, gentamicin (GARAMYCIN) 240 mg in sodium chloride 0.9 % 1,000 mL for intraperitoneal lavage   Intraperitoneal To OR Michael Boston, MD      . lactated ringers infusion   Intravenous Continuous Belinda Block, MD 10 mL/hr at 05/22/18 [phone]       No Known Allergies  BP (!) 159/69   Pulse 60   Temp 97.7 F (36.5 C) (Oral)   Ht 4\' 11"  (1.499 m)   Wt 64.4 kg   SpO2 100%   BMI 28.68 kg/m   Labs: Results for orders placed or performed during the hospital encounter of 05/20/18 (from the past 48 hour(s))  Hemoglobin A1c     Status: Abnormal   Collection Time: 05/20/18 12:26 PM  Result Value Ref Range   Hgb A1c MFr Bld 7.1 (H) 4.8 - 5.6 %    Comment: (NOTE) Pre diabetes:          5.7%-6.4% Diabetes:              >6.4% Glycemic control for   <7.0% adults with diabetes    Mean Plasma Glucose 157.07 mg/dL    Comment: Performed at [facility], [place] [address] [place], [place] [address]  Basic metabolic panel  Status: Abnormal   Collection Time: 05/20/18 12:26 PM  Result Value Ref Range   Sodium 139 135 - 145 mmol/L   Potassium 3.6 3.5 - 5.1 mmol/L   Chloride 101 98 - 111 mmol/L   CO2 26 22 - 32 mmol/L   Glucose, Bld 119 (H) 70 - 99 mg/dL   BUN 50 (H) 8 - 23 mg/dL   Creatinine, Ser 1.82 (H) 0.44 - 1.00 mg/dL   Calcium 10.1 8.9 - 10.3 mg/dL   GFR calc non Af Amer 28 (L) >60 mL/min   GFR calc Af Amer 32 (L) >60 mL/min   Anion gap 12 5 - 15    Comment: Performed at [facility], [place] [address], [place], [place] [address]  CBC     Status: Abnormal   Collection Time: 05/20/18 12:26 PM  Result Value Ref  Range   WBC 5.4 4.0 - 10.5 K/uL   RBC 4.76 3.87 - 5.11 MIL/uL   Hemoglobin 13.3 12.0 - 15.0 g/dL   HCT 42.4 36.0 - 46.0 %   MCV 89.1 80.0 - 100.0 fL   MCH 27.9 26.0 - 34.0 pg   MCHC 31.4 30.0 - 36.0 g/dL   RDW 19.4 (H) 11.5 - 15.5 %   Platelets 194 150 - 400 K/uL   nRBC 0.0 0.0 - 0.2 %    Comment: Performed at [facility], [place] [address] [place], [place] [address]  Type and screen All Cardiac and thoracic surgeries, spinal fusions, myomectomies, craniotomies, colon & liver resections, total joint revisions, same day c-section with placenta previa or accreta.     Status: None   Collection Time: 05/20/18 12:26 PM  Result Value Ref Range   ABO/RH(D) O POS    Antibody Screen NEG    Sample Expiration 05/25/2018    Extend sample reason      NO TRANSFUSIONS OR PREGNANCY IN THE PAST 3 MONTHS Performed at [facility], [place] [address] [place], [place] [address]     Imaging / Studies: Dg Chest 2 View  Result Date: 05/20/2018 CLINICAL DATA:  Preoperative radiograph. EXAM: CHEST - 2 VIEW COMPARISON:  03/05/2018 FINDINGS: Stable appearance of cardiac pacemaker/defibrillator. The cardiac silhouette is moderately enlarged, stable. Mediastinal contours appear intact. Small right pleural effusion. Patchy airspace consolidation in the right lower lobe has improved but not completely resolved. Osseous structures are without acute abnormality. Soft tissues are grossly normal. IMPRESSION: Partial resolution of right lower lobe airspace consolidation. Probable small residual right pleural effusion. Stably enlarged cardiac silhouette. Electronically Signed   By: Fidela Salisbury M.D.   On: 05/20/2018 20:29     .Adin Hector, M.D., F.A.C.S. Gastrointestinal and Minimally Invasive Surgery [facility] [address] [place],  [address] [phone] Main / Paging  05/22/2018 8:15 AM    Adin Hector

## 2018-05-23 ENCOUNTER — Encounter (HOSPITAL_COMMUNITY): Payer: Self-pay | Admitting: Surgery

## 2018-05-23 DIAGNOSIS — K7469 Other cirrhosis of liver: Secondary | ICD-10-CM | POA: Diagnosis present

## 2018-05-23 DIAGNOSIS — B1920 Unspecified viral hepatitis C without hepatic coma: Secondary | ICD-10-CM | POA: Diagnosis present

## 2018-05-23 DIAGNOSIS — M109 Gout, unspecified: Secondary | ICD-10-CM | POA: Diagnosis present

## 2018-05-23 LAB — CBC
HCT: 30.2 % — ABNORMAL LOW (ref 36.0–46.0)
Hemoglobin: 9.6 g/dL — ABNORMAL LOW (ref 12.0–15.0)
MCH: 28.7 pg (ref 26.0–34.0)
MCHC: 31.8 g/dL (ref 30.0–36.0)
MCV: 90.1 fL (ref 80.0–100.0)
Platelets: 192 10*3/uL (ref 150–400)
RBC: 3.35 MIL/uL — ABNORMAL LOW (ref 3.87–5.11)
RDW: 18.7 % — ABNORMAL HIGH (ref 11.5–15.5)
WBC: 14.1 10*3/uL — ABNORMAL HIGH (ref 4.0–10.5)
nRBC: 0.1 % (ref 0.0–0.2)

## 2018-05-23 LAB — BASIC METABOLIC PANEL
Anion gap: 15 (ref 5–15)
BUN: 41 mg/dL — ABNORMAL HIGH (ref 8–23)
CO2: 19 mmol/L — ABNORMAL LOW (ref 22–32)
Calcium: 8.5 mg/dL — ABNORMAL LOW (ref 8.9–10.3)
Chloride: 101 mmol/L (ref 98–111)
Creatinine, Ser: 2.34 mg/dL — ABNORMAL HIGH (ref 0.44–1.00)
GFR calc Af Amer: 24 mL/min — ABNORMAL LOW (ref >60)
GFR, EST NON AFRICAN AMERICAN: 20 mL/min — AB (ref >60)
GLUCOSE: 152 mg/dL — AB (ref 70–99)
Potassium: 4.3 mmol/L (ref 3.5–5.1)
Sodium: 135 mmol/L (ref 135–145)

## 2018-05-23 LAB — GLUCOSE, CAPILLARY: Glucose-Capillary: 120 mg/dL — ABNORMAL HIGH (ref 70–99)

## 2018-05-23 LAB — MAGNESIUM: Magnesium: 1.9 mg/dL (ref 1.7–2.4)

## 2018-05-23 MED ORDER — ENOXAPARIN SODIUM 30 MG/0.3ML ~~LOC~~ SOLN
30.0000 mg | SUBCUTANEOUS | Status: DC
  Administered 2018-05-24 – 2018-05-26 (×3): 30 mg via SUBCUTANEOUS
  Filled 2018-05-23 (×3): qty 0.3

## 2018-05-23 NOTE — Progress Notes (Addendum)
Morgan Clay [id number] Jul 26, 1945  CARE TEAM:  PCP: [name], DO  Outpatient Care Team: Patient Care Team: [name], DO as PCP - General (Family Medicine) [name], MD as PCP - Cardiology (Cardiology) [name], MD as Consulting Physician (Nephrology) [name], MD as Consulting Physician (Cardiology) [name], MD as Consulting Physician (Gastroenterology)  Inpatient Treatment Team: Treatment Team: Attending Provider: Michael Boston, MD; Technician: Leda Quail, NT; Registered Nurse: Kai Levins, RN   Problem List:   Principal Problem:   Diverticulitis of left colon status post robotic low anterior to sigmoid resection 05/22/2018 Active Problems:   Nonischemic cardiomyopathy ([place])   CKD (chronic kidney disease) stage 4, GFR 15-29 ml/min    Hypothyroidism   Obesity   OBSTRUCTIVE SLEEP APNEA   Type 2 diabetes, controlled, with renal manifestation ([place])   Chronic obstructive pulmonary disease ([place])   Hypertension associated with diabetes ([place])   Atrial fibrillation, chronic   Sigmoid diverticulitis   1 Day Post-Op  05/22/2018  POST-OPERATIVE DIAGNOSIS: Recurrent sigmoid diverticulitis with abscess  PROCEDURE: XI ROBOTIC ASSISTED LOW ANTERIOR RECTOSIGMOID RESECTION MOBILIZATION OF SPLENIC FLEXURE OF COLON FIREFLY ASSESSMENT OF PERFUSION RIGID PROCTOSCOPY  SURGEON:  Adin Hector, MD   Assessment  Recovering  [facility] Stay = 1 days)  Plan:  -Advance diet per ERAS protocol -Follow up on pathology -Stop IV fluids and follow. -Urine output good despite mildly elevated creatinine and patient with significant kidney disease.  Follow closely.  Low threshold to consult medicine/nephrology if deteriorates.  Hopefully not too likely  -VTE prophylaxis- SCDs, etc. Lovenox.  Was rather oozy during the case with appropriate hemoglobin drop.  Not to the threshold needing transfusion.  May benefit from IV iron if  drops further.  Hold off for now.  History of being on Eliquis anticoagulation for chronic atrial fibrillation but significant GI bleeding due to angiodysplasia and possibly diverticulitis.  Will defer resumption of that in the future with her primary cardiac and gastroenterology services  -Sliding scale insulin for now.  Gradually reintroduce oral hypoglycemics further. -Blood pressure control -CPAP for sleep apnea -Levothyroxine for hypothyroidism -mobilize as tolerated to help recovery  25 minutes spent in review, evaluation, examination, counseling, and coordination of care.  More than 50% of that time was spent in counseling.  05/23/2018    Subjective: (Chief complaint)  Sitting up in chair.  Sore but mobilize.  No nausea or vomiting.  No major events.  Objective:  Vital signs:  Vitals:   05/22/18 2137 05/22/18 2156 05/23/18 0148 05/23/18 0605  BP: (!) 146/76 134/72 112/64 115/69  Pulse: 67 68 76 74  Resp: 16 14 14 14   Temp:  (!) 97.4 F (36.3 C) 97.8 F (36.6 C) (!) 97.4 F (36.3 C)  TempSrc:  Oral Oral Oral  SpO2: 100% 99% 100% 96%  Weight:    65.2 kg  Height:           Intake/Output   Yesterday:  01/15 0701 - 01/16 0700 In: 2384.1 [I.V.:2284.1; IV Piggyback:100] Out: 425 [Urine:325; Blood:100] This shift:  No intake/output data recorded.  Bowel function:  Flatus: YES  BM:  No  Drain: (No drain)   Physical Exam:  General: Pt awake/alert/oriented x4 in no acute distress Eyes: PERRL, normal EOM.  Sclera clear.  No icterus Neuro: CN II-XII intact w/o focal sensory/motor deficits. Lymph: No head/neck/groin lymphadenopathy Psych:  No delerium/psychosis/paranoia HENT: Normocephalic, Mucus membranes moist.  No thrush Neck: Supple, No tracheal deviation  Chest: No chest wall pain w good excursion CV:  Pulses intact.  Regular rhythm MS: Normal AROM mjr joints.  No obvious deformity  Abdomen: Soft.  Nondistended.  Mildly tender at incisions only.   No evidence of peritonitis.  No incarcerated hernias.  Ext:  No deformity.  No mjr edema.  No cyanosis Skin: No petechiae / purpura  Results:   Labs: Results for orders placed or performed during the hospital encounter of 05/22/18 (from the past 48 hour(s))  Glucose, capillary     Status: Abnormal   Collection Time: 05/22/18 12:43 PM  Result Value Ref Range   Glucose-Capillary 200 (H) 70 - 99 mg/dL  Glucose, capillary     Status: Abnormal   Collection Time: 05/22/18  2:32 PM  Result Value Ref Range   Glucose-Capillary 182 (H) 70 - 99 mg/dL  Glucose, capillary     Status: Abnormal   Collection Time: 05/22/18 11:55 PM  Result Value Ref Range   Glucose-Capillary 209 (H) 70 - 99 mg/dL  Basic metabolic panel     Status: Abnormal   Collection Time: 05/23/18  4:55 AM  Result Value Ref Range   Sodium 135 135 - 145 mmol/L   Potassium 4.3 3.5 - 5.1 mmol/L   Chloride 101 98 - 111 mmol/L   CO2 19 (L) 22 - 32 mmol/L   Glucose, Bld 152 (H) 70 - 99 mg/dL   BUN 41 (H) 8 - 23 mg/dL   Creatinine, Ser 2.34 (H) 0.44 - 1.00 mg/dL   Calcium 8.5 (L) 8.9 - 10.3 mg/dL   GFR calc non Af Amer 20 (L) >60 mL/min   GFR calc Af Amer 24 (L) >60 mL/min   Anion gap 15 5 - 15    Comment: Performed at [facility], [place] [address] [place], [place] [address]  CBC     Status: Abnormal   Collection Time: 05/23/18  4:55 AM  Result Value Ref Range   WBC 14.1 (H) 4.0 - 10.5 K/uL   RBC 3.35 (L) 3.87 - 5.11 MIL/uL   Hemoglobin 9.6 (L) 12.0 - 15.0 g/dL   HCT 30.2 (L) 36.0 - 46.0 %   MCV 90.1 80.0 - 100.0 fL   MCH 28.7 26.0 - 34.0 pg   MCHC 31.8 30.0 - 36.0 g/dL   RDW 18.7 (H) 11.5 - 15.5 %   Platelets 192 150 - 400 K/uL   nRBC 0.1 0.0 - 0.2 %    Comment: Performed at [facility], [place] [address] [place], [place] [address]  Magnesium     Status: None   Collection Time: 05/23/18  4:55 AM  Result Value Ref Range   Magnesium 1.9 1.7 - 2.4 mg/dL    Comment: Performed at  [facility], [place] [address] [place],  [address]    Imaging / Studies: No results found.  Medications / Allergies: per chart  Antibiotics: Anti-infectives (From admission, onward)   Start     Dose/Rate Route Frequency Ordered Stop   05/22/18 2200  cefoTEtan (CEFOTAN) 2 g in sodium chloride 0.9 % 100 mL IVPB     2 g 200 mL/hr over 30 Minutes Intravenous Every 12 hours 05/22/18 1616 05/22/18 2201   05/22/18 1205  clindamycin (CLEOCIN) 900 mg, gentamicin (GARAMYCIN) 240 mg in sodium chloride 0.9 % 1,000 mL for intraperitoneal lavage  Status:  Discontinued       As needed 05/22/18 1205 05/22/18 1233   05/22/18 0645  cefoTEtan (CEFOTAN)  2 g in sodium chloride 0.9 % 100 mL IVPB     2 g 200 mL/hr over 30 Minutes Intravenous On call to O.R. 05/22/18 2820 05/22/18 0901   05/22/18 0600  clindamycin (CLEOCIN) 900 mg, gentamicin (GARAMYCIN) 240 mg in sodium chloride 0.9 % 1,000 mL for intraperitoneal lavage  Status:  Discontinued      Intraperitoneal To Surgery 05/21/18 0810 05/22/18 1606        Note: Portions of this report may have been transcribed using voice recognition software. Every effort was made to ensure accuracy; however, inadvertent computerized transcription errors may be present.   Any transcriptional errors that result from this process are unintentional.     Adin Hector, MD, FACS, MASCRS Gastrointestinal and Minimally Invasive Surgery    [address] [place], [place] [address] [phone] Main / Paging [phone] Fax

## 2018-05-23 NOTE — Progress Notes (Signed)
ERAS education reinforced. Did patient attend class prior to procedure? Yes [ x  ] No [   ] Discussed: Pain Control [ x  ] Mobility [ x  ] Diet [   ] Other [ x  ]  Patient in bed. Reports has walked and sat in chair earlier today. Coughing with productive sputum. IS at bedside- able to pull 500-750 x 10. Encouraged to do 10 times Q hour. Reported to beside RN about sputum production. She will assess lungs again for changes since morning assessment. Will follow. ERAS protocols discussed. Pecolia Ades, RN, BSN Quality Program Coordinator, Enhanced Recovery after Surgery 05/23/18 4:51 PM  Pecolia Ades, RN, BSN Quality Program Coordinator, Enhanced Recovery after Surgery 05/23/18 4:51 PM

## 2018-05-23 NOTE — Progress Notes (Signed)
Pt attempted to ambulate at 0210, but was only able to take a few steps before reporting dizziness and pain. She very slowly maneuvered back to bed and we will attempt ambulation again in the AM.

## 2018-05-24 LAB — HEMOGLOBIN: Hemoglobin: 6.8 g/dL — CL (ref 12.0–15.0)

## 2018-05-24 LAB — CREATININE, SERUM
Creatinine, Ser: 2.64 mg/dL — ABNORMAL HIGH (ref 0.44–1.00)
GFR calc Af Amer: 20 mL/min — ABNORMAL LOW (ref >60)
GFR, EST NON AFRICAN AMERICAN: 18 mL/min — AB (ref >60)

## 2018-05-24 LAB — POTASSIUM: Potassium: 4.2 mmol/L (ref 3.5–5.1)

## 2018-05-24 LAB — PREPARE RBC (CROSSMATCH)

## 2018-05-24 MED ORDER — BISMUTH SUBSALICYLATE 262 MG/15ML PO SUSP
30.0000 mL | Freq: Three times a day (TID) | ORAL | Status: DC | PRN
  Administered 2018-05-24 – 2018-05-26 (×3): 30 mL via ORAL
  Filled 2018-05-24: qty 236

## 2018-05-24 MED ORDER — TAB-A-VITE/IRON PO TABS
1.0000 | ORAL_TABLET | Freq: Every day | ORAL | Status: DC
  Administered 2018-05-24 – 2018-05-26 (×3): 1 via ORAL
  Filled 2018-05-24 (×3): qty 1

## 2018-05-24 MED ORDER — PSYLLIUM 95 % PO PACK
1.0000 | PACK | Freq: Two times a day (BID) | ORAL | Status: DC
  Administered 2018-05-26: 1 via ORAL
  Filled 2018-05-24 (×5): qty 1

## 2018-05-24 MED ORDER — SODIUM CHLORIDE 0.9% IV SOLUTION: Freq: Once | INTRAVENOUS | Status: DC

## 2018-05-24 MED ORDER — GABAPENTIN 400 MG PO CAPS
400.0000 mg | ORAL_CAPSULE | Freq: Three times a day (TID) | ORAL | Status: DC
  Administered 2018-05-24 – 2018-05-26 (×7): 400 mg via ORAL
  Filled 2018-05-24 (×7): qty 1

## 2018-05-24 MED ORDER — LOPERAMIDE HCL 2 MG PO CAPS: 2.0000 mg | ORAL_CAPSULE | Freq: Two times a day (BID) | ORAL | Status: DC | PRN

## 2018-05-24 NOTE — Progress Notes (Addendum)
S/p sigmoid colectomy  Path benign & c/w diverticulitis I told the pt the good news  Copy of report in room

## 2018-05-24 NOTE — Progress Notes (Addendum)
Morgan Clay [id number] [birth year]  CARE TEAM:  PCP: [name], DO  Outpatient Care Team: Patient Care Team: [name], DO as PCP - General (Family Medicine) [name], MD as PCP - Cardiology (Cardiology) [name], MD as Consulting Physician (Nephrology) [name], MD as Consulting Physician (Cardiology) [name], MD as Consulting Physician (Gastroenterology)  Inpatient Treatment Team: Treatment Team: Attending Provider: Michael Boston, MD; Technician: Leda Quail, NT; Registered Nurse: Kai Levins, RN; Registered Nurse: Kandis Nab, RN   Problem List:   Principal Problem:   Diverticulitis of left colon status post robotic low anterior to sigmoid resection 05/22/2018 Active Problems:   Nonischemic cardiomyopathy ([place])   CKD (chronic kidney disease) stage 4, GFR 15-29 ml/min    Hypothyroidism   Obesity   OBSTRUCTIVE SLEEP APNEA   Biventricular ICD (implantable cardioverter-defibrillator) in place   Type 2 diabetes, controlled, with renal manifestation ([place])   Chronic obstructive pulmonary disease ([place])   Asthma   Hypertension associated with diabetes ([place])   MDD (major depressive disorder), recurrent, in partial remission ([place])   Atrial fibrillation, chronic   Diverticulosis of colon without hemorrhage   Pulmonary hypertension ([place]) on echocardiogram   Anemia in chronic kidney disease   Hepatitis C   Gout   FIBROIDS, UTERUS   2 Days Post-Op  05/22/2018  POST-OPERATIVE DIAGNOSIS: Recurrent sigmoid diverticulitis with abscess  PROCEDURE: XI ROBOTIC ASSISTED LOW ANTERIOR RECTOSIGMOID RESECTION MOBILIZATION OF SPLENIC FLEXURE OF COLON FIREFLY ASSESSMENT OF PERFUSION RIGID PROCTOSCOPY  SURGEON:  Adin Hector, MD   Assessment  Recovering  [facility] Stay = 2 days)  Plan:  -Advance diet per ERAS protocol.  Try soft diet.  Remove dressing postoperative day 3 = Sat 1/18.  -Pathology consistent with  diverticulitis.  Information relayed to patient and copy pathology report in room.  She was relieved.    -Stop IV fluids and follow.  Improved pain control.  Continue Tylenol.  Increase gabapentin.  Try to encourage patient and nursing to use tramadol before IV Dilaudid for better long-term control  Fiber and probiotic bowel regimen to help normalize bowels.  -Urine output okay with significant chronic kidney disease and elevated postoperative creatinine.  Hopefully will stabilize.  Follow closely.  Low threshold to consult medicine/nephrology if deteriorates.  Hopefully not too likely  -VTE prophylaxis- SCDs, etc. Lovenox.  Was rather oozy during the case with appropriate hemoglobin drop.  Not to the threshold needing transfusion.  May benefit from IV iron if drops further.  Hold off for now.  History of being on Eliquis anticoagulation for chronic atrial fibrillation but significant GI bleeding due to angiodysplasia and possibly diverticulitis.  Will defer resumption of that in the future with her primary, cardiac and gastroenterology services  -Sliding scale insulin for now.  No significant hyperglycemia past 24 hours.  Normal/low side.  Gradually reintroduce oral hypoglycemics further.  -Blood pressure control.  Continue Coreg.  PRN backup.  -CPAP for sleep apnea  -Allopurinol and colchicine for history of gout to avoid new attack.  -Levothyroxine for hypothyroidism  -mobilize as tolerated to help recovery  25 minutes spent in review, evaluation, examination, counseling, and coordination of care.  More than 50% of that time was spent in counseling.  05/24/2018    Subjective: (Chief complaint)  Having bowel movements.  Crampy abdominal pain.  Prefers Dilaudid.  On bedside commode.  Nursing in room.  No nausea or vomiting.  Tolerating liquids.  Objective:  Vital signs:  Vitals:  05/23/18 1427 05/23/18 2023 05/23/18 2209 05/24/18 0518  BP: (!) 102/50 (!) 132/50 (!)  139/58 121/62  Pulse: (!) 59 73 72 76  Resp:  18 16 16   Temp:   98.1 F (36.7 C) 98.5 F (36.9 C)  TempSrc:   Oral Oral  SpO2:  100% 96% 99%  Weight:      Height:           Intake/Output   Yesterday:  01/16 0701 - 01/17 0700 In: 1617.9 [P.O.:540; I.V.:1077.9] Out: 476 [Urine:475; Stool:1] This shift:  No intake/output data recorded.  Bowel function:  Flatus: YES  BM:  YES  Drain: (No drain)   Physical Exam:  General: Pt awake/alert/oriented x4 in mild acute distress.  Not toxic, just tired/exhausted. Eyes: PERRL, normal EOM.  Sclera clear.  No icterus Neuro: CN II-XII intact w/o focal sensory/motor deficits. Lymph: No head/neck/groin lymphadenopathy Psych:  No delerium/psychosis/paranoia HENT: Normocephalic, Mucus membranes moist.  No thrush Neck: Supple, No tracheal deviation Chest: No chest wall pain w good excursion CV:  Pulses intact.  Regular rhythm MS: Normal AROM mjr joints.  No obvious deformity  Abdomen:  Obese.   Soft.  Nondistended.  Mildly tender at incisions only.  No evidence of peritonitis.  No incarcerated hernias.  Ext:  No deformity.  No mjr edema.  No cyanosis Skin: No petechiae / purpura  Results:   Labs: Results for orders placed or performed during the hospital encounter of 05/22/18 (from the past 48 hour(s))  Glucose, capillary     Status: Abnormal   Collection Time: 05/22/18 12:43 PM  Result Value Ref Range   Glucose-Capillary 200 (H) 70 - 99 mg/dL  Glucose, capillary     Status: Abnormal   Collection Time: 05/22/18  2:32 PM  Result Value Ref Range   Glucose-Capillary 182 (H) 70 - 99 mg/dL  Glucose, capillary     Status: Abnormal   Collection Time: 05/22/18 11:55 PM  Result Value Ref Range   Glucose-Capillary 209 (H) 70 - 99 mg/dL  Basic metabolic panel     Status: Abnormal   Collection Time: 05/23/18  4:55 AM  Result Value Ref Range   Sodium 135 135 - 145 mmol/L   Potassium 4.3 3.5 - 5.1 mmol/L   Chloride 101 98 - 111 mmol/L    CO2 19 (L) 22 - 32 mmol/L   Glucose, Bld 152 (H) 70 - 99 mg/dL   BUN 41 (H) 8 - 23 mg/dL   Creatinine, Ser 2.34 (H) 0.44 - 1.00 mg/dL   Calcium 8.5 (L) 8.9 - 10.3 mg/dL   GFR calc non Af Amer 20 (L) >60 mL/min   GFR calc Af Amer 24 (L) >60 mL/min   Anion gap 15 5 - 15    Comment: Performed at [facility], [place] [address] [place], [place] [address]  CBC     Status: Abnormal   Collection Time: 05/23/18  4:55 AM  Result Value Ref Range   WBC 14.1 (H) 4.0 - 10.5 K/uL   RBC 3.35 (L) 3.87 - 5.11 MIL/uL   Hemoglobin 9.6 (L) 12.0 - 15.0 g/dL   HCT 30.2 (L) 36.0 - 46.0 %   MCV 90.1 80.0 - 100.0 fL   MCH 28.7 26.0 - 34.0 pg   MCHC 31.8 30.0 - 36.0 g/dL   RDW 18.7 (H) 11.5 - 15.5 %   Platelets 192 150 - 400 K/uL   nRBC 0.1 0.0 - 0.2 %    Comment: Performed at  [facility], [place] [address], [place], [place] [address]  Magnesium     Status: None   Collection Time: 05/23/18  4:55 AM  Result Value Ref Range   Magnesium 1.9 1.7 - 2.4 mg/dL    Comment: Performed at [facility], [place] [address] [place], [place] [address]  Glucose, capillary     Status: Abnormal   Collection Time: 05/23/18  7:57 AM  Result Value Ref Range   Glucose-Capillary 120 (H) 70 - 99 mg/dL    Imaging / Studies: No results found.  Medications / Allergies: per chart  Antibiotics: Anti-infectives (From admission, onward)   Start     Dose/Rate Route Frequency Ordered Stop   05/22/18 2200  cefoTEtan (CEFOTAN) 2 g in sodium chloride 0.9 % 100 mL IVPB     2 g 200 mL/hr over 30 Minutes Intravenous Every 12 hours 05/22/18 1616 05/22/18 2201   05/22/18 1205  clindamycin (CLEOCIN) 900 mg, gentamicin (GARAMYCIN) 240 mg in sodium chloride 0.9 % 1,000 mL for intraperitoneal lavage  Status:  Discontinued       As needed 05/22/18 1205 05/22/18 1233   05/22/18 0645  cefoTEtan (CEFOTAN) 2 g in sodium chloride 0.9 % 100 mL IVPB     2 g 200 mL/hr over 30 Minutes  Intravenous On call to O.R. 05/22/18 6861 05/22/18 0901   05/22/18 0600  clindamycin (CLEOCIN) 900 mg, gentamicin (GARAMYCIN) 240 mg in sodium chloride 0.9 % 1,000 mL for intraperitoneal lavage  Status:  Discontinued      Intraperitoneal To Surgery 05/21/18 0810 05/22/18 1606        Note: Portions of this report may have been transcribed using voice recognition software. Every effort was made to ensure accuracy; however, inadvertent computerized transcription errors may be present.   Any transcriptional errors that result from this process are unintentional.     Adin Hector, MD, FACS, MASCRS Gastrointestinal and Minimally Invasive Surgery    [address] [place], [place] [address] [phone] Main / Paging [phone] Fax

## 2018-05-24 NOTE — Progress Notes (Signed)
ERAS education reinforced. Did patient attend class prior to procedure? Yes [ x ] No [   ] Discussed: Pain Control [x  ] Mobility [   ] Diet [   ] Other [   ]  Patient in bed. Reports she has been out of bed today. Reports her lungs are better than yesterday. Did not hear her coughing today. Requested a blanket- gave her one. Pecolia Ades, RN, BSN Quality Program Coordinator, Enhanced Recovery after Surgery 05/24/18 3:27 PM

## 2018-05-24 NOTE — Care Management Important Message (Signed)
Important Message  Patient Details  Name: Morgan Clay MRN: [id number] Date of Birth: 20-Jan-1946   Medicare Important Message Given:  Yes    Benjermin Korber 05/24/2018, 9:13 AM

## 2018-05-24 NOTE — Plan of Care (Signed)
Patient progressing towards goals.

## 2018-05-24 NOTE — Progress Notes (Signed)
Informed patient's new hgb value today is 6.8 postoperatively.  It was in the mid 9s preoperatively.  She is asymptomatic and is not actively bleeding.  She has had several loose BMs today and no evidence of blood present in her stools or from her incision sites etc.  We will transfuse 1 unit of p RBCs and recheck a cbc in the am.  Given this is likely equilibration post op as case was "oozy" per Dr. Johney Maine' note, her lovenox will not be held at this time.  Will start on iron supplement for now.  On Furosemide already for previous cardiac issues, shouldn't need any further diuresis unless clinically indicated.  Henreitta Cea 2:17 PM 05/24/2018

## 2018-05-24 NOTE — Progress Notes (Addendum)
Pharmacy Brief Note - Alvimopan (Entereg)  The standing order set for alvimopan (Entereg) now includes an automatic order to discontinue the drug after the patient has had a bowel movement. The change was approved by the [place] and the Medical Executive Committee.  This patient has had a bowel movement documented by surgeon. Therefore, alvimopan has been discontinued. If there are questions, please contact the pharmacy at [phone].  Thank you  Reuel Boom, PharmD, BCPS [phone] 05/24/2018, 1:42 PM

## 2018-05-24 NOTE — Progress Notes (Signed)
  CRITICAL VALUE ALERT  Critical Value:  Hgb 6.8  Date & Time Notied:  05/24/18 1220  Provider Notified: Dr. Johney Maine  Orders Received/Actions taken: awaiting return call.

## 2018-05-25 LAB — CBC
HCT: 25.4 % — ABNORMAL LOW (ref 36.0–46.0)
HEMOGLOBIN: 7.9 g/dL — AB (ref 12.0–15.0)
MCH: 28.6 pg (ref 26.0–34.0)
MCHC: 31.1 g/dL (ref 30.0–36.0)
MCV: 92 fL (ref 80.0–100.0)
Platelets: 176 10*3/uL (ref 150–400)
RBC: 2.76 MIL/uL — ABNORMAL LOW (ref 3.87–5.11)
RDW: 18.3 % — ABNORMAL HIGH (ref 11.5–15.5)
WBC: 9.1 10*3/uL (ref 4.0–10.5)
nRBC: 0.2 % (ref 0.0–0.2)

## 2018-05-25 LAB — TYPE AND SCREEN
ABO/RH(D): O POS
Antibody Screen: NEGATIVE
Unit division: 0

## 2018-05-25 LAB — CREATININE, SERUM
Creatinine, Ser: 2.22 mg/dL — ABNORMAL HIGH (ref 0.44–1.00)
GFR calc Af Amer: 25 mL/min — ABNORMAL LOW (ref >60)
GFR calc non Af Amer: 22 mL/min — ABNORMAL LOW (ref >60)

## 2018-05-25 LAB — BPAM RBC
Blood Product Expiration Date: 202002202359
ISSUE DATE / TIME: 202001171612
Unit Type and Rh: 5100

## 2018-05-25 LAB — POTASSIUM: Potassium: 4.2 mmol/L (ref 3.5–5.1)

## 2018-05-25 LAB — MAGNESIUM: Magnesium: 2.1 mg/dL (ref 1.7–2.4)

## 2018-05-25 NOTE — Progress Notes (Signed)
Assessment & Plan: Principal Problem:   Diverticulitis of left colon status post robotic low anterior to sigmoid resection 05/22/2018 Active Problems:   Nonischemic cardiomyopathy (H[facility])   CKD (chronic kidney disease) stage 4, GFR 15-29 ml/min    Hypothyroidism   Obesity   OBSTRUCTIVE SLEEP APNEA   Biventricular ICD (implantable cardioverter-defibrillator) in place   Type 2 diabetes, controlled, with renal manifestation ([place])   Chronic obstructive pulmonary disease ([place])   Asthma   Hypertension associated with diabetes ([place])   MDD (major depressive disorder), recurrent, in partial remission (H[facility])   Atrial fibrillation, chronic   Diverticulosis of colon without hemorrhage   Pulmonary hypertension ([place]) on echocardiogram   Anemia in chronic kidney disease   Hepatitis C   Gout   FIBROIDS, UTERUS  Hgb up to 7.9 after 1 U PRBC yesterday.  No further evidence of bleeding. Repeat CBC in AM 1/19 OOB, ambulating in halls Anticipate discharge home on Sunday, 1/19 - patient making arrangements with family        Armandina Gemma, MD       [facility]       Office: [phone]   Chief Complaint: Diverticular disease  Subjective: patient in bed, no complaints.  Mild pain.  Tolerating diet.  having BM's  Objective: Vital signs in last 24 hours: Temp:  [97.7 F (36.5 C)-98.6 F (37 C)] 98.2 F (36.8 C) (01/18 0932) Pulse Rate:  [70-79] 72 (01/18 0613) Resp:  [16-20] 16 (01/18 0613) BP: (111-145)/(47-64) 124/59 (01/18 0613) SpO2:  [92 %-100 %] 94 % (01/18 6712) Weight:  [66.7 kg] 66.7 kg (01/18 0613) Last BM Date: 05/24/18  Intake/Output from previous day: 01/17 0701 - 01/18 0700 In: 2843.1 [P.O.:1040; I.V.:1503.1; Blood:300] Out: 205 [Urine:205] Intake/Output this shift: No intake/output data recorded.  Physical Exam: HEENT - sclerae clear, mucous membranes moist Neck - soft Chest - clear bilaterally Cor - RRR Abdomen - soft without distension;  dressings dry and intact; BS present Ext - no edema, non-tender Neuro - alert & oriented, no focal deficits  Lab Results:  Recent Labs    05/23/18 0455 05/24/18 1105 05/25/18 0428  WBC 14.1*  --  9.1  HGB 9.6* 6.8* 7.9*  HCT 30.2*  --  25.4*  PLT 192  --  176   BMET Recent Labs    05/23/18 0455 05/24/18 1105 05/25/18 0428  NA 135  --   --   K 4.3 4.2 4.2  CL 101  --   --   CO2 19*  --   --   GLUCOSE 152*  --   --   BUN 41*  --   --   CREATININE 2.34* 2.64* 2.22*  CALCIUM 8.5*  --   --    PT/INR No results for input(s): LABPROT, INR in the last 72 hours. Comprehensive Metabolic Panel:    Component Value Date/Time   NA 135 05/23/2018 0455   NA 139 05/20/2018 1226   NA 138 12/31/2017 0846   NA 136 12/24/2017 1537   K 4.2 05/25/2018 0428   K 4.2 05/24/2018 1105   CL 101 05/23/2018 0455   CL 101 05/20/2018 1226   CO2 19 (L) 05/23/2018 0455   CO2 26 05/20/2018 1226   BUN 41 (H) 05/23/2018 0455   BUN 50 (H) 05/20/2018 1226   BUN 99 (HH) 12/31/2017 0846   BUN 72 (H) 12/24/2017 1537   CREATININE 2.22 (H) 05/25/2018 0428   CREATININE 2.64 (H) 05/24/2018 1105  CREATININE 2.36 (H) 11/23/2017 1132   CREATININE 1.26 (H) 02/25/2016 1607   GLUCOSE 152 (H) 05/23/2018 0455   GLUCOSE 119 (H) 05/20/2018 1226   CALCIUM 8.5 (L) 05/23/2018 0455   CALCIUM 10.1 05/20/2018 1226   AST 29 03/05/2018 0555   AST 56 (H) 02/27/2018 1730   AST 14 (L) 11/23/2017 1132   ALT 23 03/05/2018 0555   ALT 30 02/27/2018 1730   ALT 9 11/23/2017 1132   ALKPHOS 70 03/05/2018 0555   ALKPHOS 80 02/27/2018 1730   BILITOT 1.1 03/05/2018 0555   BILITOT 1.8 (H) 02/27/2018 1730   BILITOT 0.3 11/23/2017 1132   BILITOT 0.3 07/19/2017 1503   PROT 7.1 03/05/2018 0555   PROT 8.2 (H) 02/27/2018 1730   PROT 8.3 07/19/2017 1503   ALBUMIN 3.0 (L) 03/05/2018 0555   ALBUMIN 3.7 02/27/2018 1730   ALBUMIN 4.9 (H) 07/19/2017 1503    Studies/Results: No results found.    Armandina Gemma 05/25/2018  Patient ID: Morgan Clay, female   DOB: Nov 23, 1945, 73 y.o.   MRN: [id number]

## 2018-05-26 LAB — CBC
HCT: 26.6 % — ABNORMAL LOW (ref 36.0–46.0)
Hemoglobin: 8.4 g/dL — ABNORMAL LOW (ref 12.0–15.0)
MCH: 28.5 pg (ref 26.0–34.0)
MCHC: 31.6 g/dL (ref 30.0–36.0)
MCV: 90.2 fL (ref 80.0–100.0)
PLATELETS: 204 10*3/uL (ref 150–400)
RBC: 2.95 MIL/uL — AB (ref 3.87–5.11)
RDW: 18.3 % — ABNORMAL HIGH (ref 11.5–15.5)
WBC: 9.9 10*3/uL (ref 4.0–10.5)
nRBC: 0.4 % — ABNORMAL HIGH (ref 0.0–0.2)

## 2018-05-26 LAB — CREATININE, SERUM
Creatinine, Ser: 1.99 mg/dL — ABNORMAL HIGH (ref 0.44–1.00)
GFR calc Af Amer: 29 mL/min — ABNORMAL LOW (ref >60)
GFR calc non Af Amer: 25 mL/min — ABNORMAL LOW (ref >60)

## 2018-05-26 LAB — POTASSIUM: Potassium: 3.7 mmol/L (ref 3.5–5.1)

## 2018-05-26 MED ORDER — GABAPENTIN 300 MG PO CAPS: 400.0000 mg | ORAL_CAPSULE | Freq: Three times a day (TID) | ORAL | 0 refills | Status: DC

## 2018-05-26 MED ORDER — ACETAMINOPHEN 325 MG PO TABS: 650.0000 mg | ORAL_TABLET | Freq: Four times a day (QID) | ORAL | Status: DC | PRN

## 2018-05-26 MED ORDER — TRAMADOL HCL 50 MG PO TABS: 50.0000 mg | ORAL_TABLET | Freq: Four times a day (QID) | ORAL | 0 refills | Status: DC | PRN

## 2018-05-26 NOTE — Progress Notes (Signed)
Assessment & Plan: POD#4 Principal Problem: Diverticulitis of left colon status post robotic low anterior to sigmoid resection 05/22/2018 Active Problems: Nonischemic cardiomyopathy (HCC) CKD (chronic kidney disease) stage 4, GFR 15-29 ml/min  Hypothyroidism Obesity OBSTRUCTIVE SLEEP APNEA Biventricular ICD (implantable cardioverter-defibrillator) in place Type 2 diabetes, controlled, with renal manifestation ([place]) Chronic obstructive pulmonary disease ([place]) Asthma Hypertension associated with diabetes ([place]) MDD (major depressive disorder), recurrent, in partial remission (HCC) Atrial fibrillation, chronic Diverticulosis of colon without hemorrhage Pulmonary hypertension ([place]) on echocardiogram Anemia in chronic kidney disease Hepatitis C Gout FIBROIDS, UTERUS  Hgb up to 8.4.  No evidence of bleeding. OOB, ambulating in halls [place], having loose BM's Discharge home today.        Armandina Gemma, MD       [facility]       Office: [phone]   Chief Complaint: Diverticular disease  Subjective: Patient in bed, comfortable.  Complains of diarrhea yesterday, better this AM.  Tolerating diet.  Objective: Vital signs in last 24 hours: Temp:  [97.5 F (36.4 C)-97.8 F (36.6 C)] 97.8 F (36.6 C) (01/19 0549) Pulse Rate:  [63-64] 64 (01/19 0549) Resp:  [16] 16 (01/19 0549) BP: (116-146)/(53-71) 146/71 (01/19 0549) SpO2:  [98 %-100 %] 100 % (01/19 0549) Weight:  [66.9 kg] 66.9 kg (01/19 0549) Last BM Date: 05/25/18  Intake/Output from previous day: 01/18 0701 - 01/19 0700 In: 950 [P.O.:840; I.V.:110] Out: 450 [Urine:450] Intake/Output this shift: Total I/O In: 60 [P.O.:60] Out: -   Physical Exam: HEENT - sclerae clear, mucous membranes moist Neck - soft Abdomen - soft, protuberant; mild tenderness diffusely; wounds dry and intact; BS present Neuro - alert & oriented, no focal  deficits  Lab Results:  Recent Labs    05/25/18 0428 05/26/18 0740  WBC 9.1 9.9  HGB 7.9* 8.4*  HCT 25.4* 26.6*  PLT 176 204   BMET Recent Labs    05/25/18 0428 05/26/18 0740  K 4.2 3.7  CREATININE 2.22* 1.99*   PT/INR No results for input(s): LABPROT, INR in the last 72 hours. Comprehensive Metabolic Panel:    Component Value Date/Time   NA 135 05/23/2018 0455   NA 139 05/20/2018 1226   NA 138 12/31/2017 0846   NA 136 12/24/2017 1537   K 3.7 05/26/2018 0740   K 4.2 05/25/2018 0428   CL 101 05/23/2018 0455   CL 101 05/20/2018 1226   CO2 19 (L) 05/23/2018 0455   CO2 26 05/20/2018 1226   BUN 41 (H) 05/23/2018 0455   BUN 50 (H) 05/20/2018 1226   BUN 99 (HH) 12/31/2017 0846   BUN 72 (H) 12/24/2017 1537   CREATININE 1.99 (H) 05/26/2018 0740   CREATININE 2.22 (H) 05/25/2018 0428   CREATININE 2.36 (H) 11/23/2017 1132   CREATININE 1.26 (H) 02/25/2016 1607   GLUCOSE 152 (H) 05/23/2018 0455   GLUCOSE 119 (H) 05/20/2018 1226   CALCIUM 8.5 (L) 05/23/2018 0455   CALCIUM 10.1 05/20/2018 1226   AST 29 03/05/2018 0555   AST 56 (H) 02/27/2018 1730   AST 14 (L) 11/23/2017 1132   ALT 23 03/05/2018 0555   ALT 30 02/27/2018 1730   ALT 9 11/23/2017 1132   ALKPHOS 70 03/05/2018 0555   ALKPHOS 80 02/27/2018 1730   BILITOT 1.1 03/05/2018 0555   BILITOT 1.8 (H) 02/27/2018 1730   BILITOT 0.3 11/23/2017 1132   BILITOT 0.3 07/19/2017 1503   PROT 7.1 03/05/2018 0555   PROT 8.2 (H) 02/27/2018  1730   PROT 8.3 07/19/2017 1503   ALBUMIN 3.0 (L) 03/05/2018 0555   ALBUMIN 3.7 02/27/2018 1730   ALBUMIN 4.9 (H) 07/19/2017 1503    Studies/Results: No results found.    Armandina Gemma 05/26/2018  Patient ID: Jeri Lager, female   DOB: 1945/05/19, 73 y.o.   MRN: [id number]

## 2018-05-29 ENCOUNTER — Telehealth: Payer: Self-pay | Admitting: Family Medicine

## 2018-05-29 NOTE — Telephone Encounter (Signed)
Copied from [place] [phone]. Topic: General - Other >> May 29, 2018  1:26 PM Keene Breath wrote: Reason for CRM: Lauren with [facility] called to request the patient's GFR in order to get the patient in the Kidney Disease Program.  Please advise and call back with the information.  CB# [phone] ext. [place], Fax# [phone]

## 2018-05-30 NOTE — Telephone Encounter (Signed)
I would recommend they discuss this with her kidney specialist. Thanks.

## 2018-05-30 NOTE — Telephone Encounter (Signed)
I left a detailed message on the voicemail for Morgan Clay with the information below.

## 2018-05-31 NOTE — Discharge Summary (Signed)
Physician Discharge Summary    Patient ID: Morgan Clay MRN: [id number] DOB/AGE: Oct 05, 1945  73 y.o.  Patient Care Team: [name], DO as PCP - General (Family Medicine) [name], MD as PCP - Cardiology (Cardiology) [name], MD as Consulting Physician (Nephrology) [name], MD as Consulting Physician (Cardiology) [name], MD as Consulting Physician (Gastroenterology)  Admit date: 05/22/2018  Discharge date: 05/26/2018 Hospital Stay = 4 days    Discharge Diagnoses:  Principal Problem:   Diverticulitis of left colon status post robotic low anterior to sigmoid resection 05/22/2018 Active Problems:   Nonischemic cardiomyopathy ([place])   CKD (chronic kidney disease) stage 4, GFR 15-29 ml/min    Hypothyroidism   Obesity   OBSTRUCTIVE SLEEP APNEA   Biventricular ICD (implantable cardioverter-defibrillator) in place   Type 2 diabetes, controlled, with renal manifestation ([place])   Chronic obstructive pulmonary disease ([place])   Asthma   Hypertension associated with diabetes ([place])   MDD (major depressive disorder), recurrent, in partial remission ([place])   Atrial fibrillation, chronic   Diverticulosis of colon without hemorrhage   Pulmonary hypertension ([place]) on echocardiogram   Anemia in chronic kidney disease   Hepatitis C   Gout   FIBROIDS, UTERUS   9 Days Post-Op  05/22/2018  POST-OPERATIVE DIAGNOSIS:   Diverticulitis  SURGERY:  05/22/2018  Procedure(s): XI ROBOTIC ASSISTED SIGMOID COLOECTOMY MOBILIZATION OF SPLENIC FLEXURE, FIREFLY ASSESSMENT OF PERFUSION RIGID PROCTOSCOPY  SURGEON:    Surgeon(s): Michael Boston, MD Carlena Hurl, PA-C Leighton Ruff, MD  Consults: None  Hospital Course:   Patient with recurrent diverticulitis including a complicated abscess recurrent hospitalization antibiotics.  Evaluated felt to be of benign etiology.  Because of recurrent attacks and more complicated attack, surgery was recommended.  The  patient underwent the surgery above.  Postoperatively, the patient gradually mobilized and advanced to a solid diet.  Pain and other symptoms were treated aggressively.    By the time of discharge, the patient was walking well the hallways, eating food, having flatus.  Pain was well-controlled on an oral medications.  Based on meeting discharge criteria and continuing to recover, I felt it was safe for the patient to be discharged from the hospital to further recover with close followup. Postoperative recommendations were discussed in detail.  They are written as well.  Discharged Condition: good  Discharge Exam: Blood pressure (!) 146/71, pulse 64, temperature 97.8 F (36.6 C), temperature source Oral, resp. rate 16, height 4\' 11"  (1.499 m), weight 66.9 kg, SpO2 100 %.  General: Pt awake/alert/oriented x4 in No acute distress Eyes: PERRL, normal EOM.  Sclera clear.  No icterus Neuro: CN II-XII intact w/o focal sensory/motor deficits. Lymph: No head/neck/groin lymphadenopathy Psych:  No delerium/psychosis/paranoia HENT: Normocephalic, Mucus membranes moist.  No thrush Neck: Supple, No tracheal deviation Chest: No chest wall pain w good excursion CV:  Pulses intact.  Regular rhythm MS: Normal AROM mjr joints.  No obvious deformity Abdomen: Soft.  Nondistended.  Mildly tender at incisions only.  No evidence of peritonitis.  No incarcerated hernias. Ext:  SCDs BLE.  No mjr edema.  No cyanosis Skin: No petechiae / purpura   Disposition:   Follow-up Information    Michael Boston, MD. Schedule an appointment as soon as possible for a visit in 2 week(s).   Specialty:  General Surgery Contact information: [address] [place] [address] [phone]             Discharge Instructions  Diet - low sodium heart healthy   Complete by:  As directed    Discharge instructions   Complete by:  As directed    [facility]  LAPAROSCOPIC SURGERY:   POST-OP INSTRUCTIONS  Always review your discharge instruction sheet given to you by the facility where your surgery was performed.  A prescription for pain medication may be given to you upon discharge.  Take your pain medication as prescribed.  If narcotic pain medicine is not needed, then you may take acetaminophen (Tylenol) or ibuprofen (Advil) as needed.  Take your usually prescribed medications unless otherwise directed.  If you need a refill on your pain medication, please contact your pharmacy.  They will contact our office to request authorization. Prescriptions will not be filled after 5 P.M. or on weekends.  You should follow a light diet the first few days after arrival home, such as soup and crackers or toast.  Be sure to include plenty of fluids daily.  Most patients will experience some swelling and bruising in the area of the incisions.  Ice packs will help.  Swelling and bruising can take several days to resolve.   It is common to experience some constipation after surgery.  Increasing fluid intake and taking a stool softener (such as Colace) will usually help or prevent this problem from occurring.  A mild laxative (Milk of Magnesia or Miralax) should be taken according to package instructions if there has been no bowel movement after 48 hours.  You will have steri-strips and a gauze dressing over your incisions.  You may remove the gauze bandage on the second day after surgery, and you may shower at that time.  Leave your steri-strips (small skin tapes) in place directly over the incision.  These strips should remain on the skin for 5-7 days and then be removed.  You may get them wet in the shower and pat them dry.  Any sutures or staples will be removed at the office during your follow-up visit.  ACTIVITIES:  You may resume regular (light) daily activities beginning the next day - such as daily self-care, walking, climbing stairs - gradually increasing activities as tolerated.   You may have sexual intercourse when it is comfortable.  Refrain from any heavy lifting or straining until approved by your doctor.  You may drive when you are no longer taking prescription pain medication, you can comfortably wear a seatbelt, and you can safely maneuver your car and apply brakes.  You should see your doctor in the office for a follow-up appointment approximately 2-3 weeks after your surgery.  Make sure that you call for this appointment within a day or two after you arrive home to insure a convenient appointment time.  WHEN TO CALL YOUR DOCTOR: Fever over 101.0 Inability to urinate Continued bleeding from incision Increased pain, redness, or drainage from the incision Increasing abdominal pain  The clinic staff is available to answer your questions during regular business hours.  Please don't hesitate to call and ask to speak to one of the nurses for clinical concerns.  If you have a medical emergency, go to the nearest emergency room or call 911.  A surgeon from [facility] is always on call for the hospital.  Earnstine Regal, MD, [facility] Office: [place] Free:  [place] [phone]  Website: [email or web address]   Increase activity slowly   Complete by:  As directed    No dressing needed  Complete by:  As directed    Remove dressing in 24 hours   Complete by:  As directed       Allergies as of 05/26/2018   No Known Allergies     Medication List    STOP taking these medications   menthol-cetylpyridinium 3 MG lozenge Commonly known as:  CEPACOL     TAKE these medications   acetaminophen 325 MG tablet Commonly known as:  TYLENOL Take 2 tablets (650 mg total) by mouth every 6 (six) hours as needed.   albuterol 108 (90 Base) MCG/ACT inhaler Commonly known as:  PROVENTIL HFA;VENTOLIN HFA Inhale 2 puffs into the lungs every 6 (six) hours as needed for wheezing or shortness of  breath.   allopurinol 300 MG tablet Commonly known as:  ZYLOPRIM Take 300 mg by mouth daily.   BIDIL 20-37.5 MG tablet Generic drug:  isosorbide-hydrALAZINE TAKE 1 TABLET BY MOUTH TWICE DAILY   carvedilol 25 MG tablet Commonly known as:  COREG Take 25 mg by mouth 2 (two) times daily.   colchicine 0.6 MG tablet Take 0.6 mg by mouth daily as needed (gout flares).   gabapentin 300 MG capsule Commonly known as:  NEURONTIN Take 1 capsule (300 mg total) by mouth 3 (three) times daily.   levothyroxine 125 MCG tablet Commonly known as:  SYNTHROID, LEVOTHROID Take 125 mcg by mouth daily before breakfast.   nitroGLYCERIN 0.4 MG SL tablet Commonly known as:  NITROSTAT Place 0.4 mg under the tongue every 5 (five) minutes as needed for chest pain.   oxyCODONE 5 MG immediate release tablet Commonly known as:  Oxy IR/ROXICODONE Take 1 tablet (5 mg total) by mouth every 6 (six) hours as needed for moderate pain.   pantoprazole 40 MG tablet Commonly known as:  PROTONIX Take 1 tablet (40 mg total) by mouth daily.   Potassium Chloride ER 20 MEQ Tbcr Take 20 mEq by mouth daily.   sertraline 50 MG tablet Commonly known as:  ZOLOFT Take 1.5 tablets (75 mg total) by mouth daily.   sodium bicarbonate 650 MG tablet Take 1 tablet (650 mg total) by mouth 2 (two) times daily. What changed:  when to take this   torsemide 20 MG tablet Commonly known as:  DEMADEX Take 1 tablet (20 mg total) by mouth as needed (for 3 lb weight gain). What changed:    how much to take  when to take this   traMADol 50 MG tablet Commonly known as:  ULTRAM Take 1-2 tablets (50-100 mg total) by mouth every 6 (six) hours as needed for moderate pain or severe pain (mild pain).       Significant Diagnostic Studies:  No results found for this or any previous visit (from the past 72 hour(s)).  Dg Chest 2 View  Result Date: 05/20/2018 CLINICAL DATA:  Preoperative radiograph. EXAM: CHEST - 2 VIEW  COMPARISON:  03/05/2018 FINDINGS: Stable appearance of cardiac pacemaker/defibrillator. The cardiac silhouette is moderately enlarged, stable. Mediastinal contours appear intact. Small right pleural effusion. Patchy airspace consolidation in the right lower lobe has improved but not completely resolved. Osseous structures are without acute abnormality. Soft tissues are grossly normal. IMPRESSION: Partial resolution of right lower lobe airspace consolidation. Probable small residual right pleural effusion. Stably enlarged cardiac silhouette. Electronically Signed   By: Fidela Salisbury M.D.   On: 05/20/2018 20:29    Past Medical History:  Diagnosis Date  . A-fib ([place])    on Eliquis  . AICD (automatic cardioverter/defibrillator) present 10/06/2015  .  Anemia   . ARF (acute renal failure) ([place])   . Arthritis    "qwhere" (01/03/2018)  . Asthma    reports mild asthma since childhood - had COPD on dx list from prior PCP  . Chronic bronchitis ([place])    "get it most years; not this past year though" (01/03/2018)  . Chronic kidney disease    "? stage;  followed by [facility]; [name]" (01/03/2018)  . Chronic systolic congestive heart failure ([place])   . DEPRESSION 12/17/2009   Annotation: PHQ-9 score = 14 done on 12/17/2009 Qualifier: Diagnosis of  By: Hassell Done FNP, Tori Milks    . Diabetes mellitus without complication (H[facility])    DIET CONTROLLED   . Diverticulosis   . Enteritis   . FIBROIDS, UTERUS 03/05/2008  . GI bleeding 01/03/2018  . Glaucoma   . Gout   . Hepatitis C    HEPATITIS C - s/p treatment with Harvoni, saw hepatology, Dawn Drazek  . History of blood transfusion ~ 11/2017  . Hyperlipemia 12/06/2012  . HYPERTENSION, BENIGN 04/24/2007  . Hypothyroidism   . LBBB (left bundle branch block)   . Lower GI bleeding    "been dealing w/it since 07/2017" (01/03/2018)  . Nonischemic cardiomyopathy ([place])   . OBESITY 05/27/2009  . OBSTRUCTIVE SLEEP APNEA 11/14/2007   no CPAP  . OSTEOPENIA  09/30/2008  . Pain and swelling of left upper extremity 08/08/2017  . Personal history of other infectious and parasitic disease    Hepatitis B  . Pneumonia    "several times" (01/03/2018)  . Pulmonary edema, acute ([place]) 08/09/2017    Past Surgical History:  Procedure Laterality Date  . APPENDECTOMY    . CARDIAC CATHETERIZATION    . CARDIOVERSION N/A 12/14/2014   Procedure: CARDIOVERSION;  Surgeon: Dorothy Spark, MD;  Location: [facility] ENDOSCOPY;  Service: Cardiovascular;  Laterality: N/A;  . CARDIOVERSION N/A 02/26/2017   Procedure: CARDIOVERSION;  Surgeon: Evans Lance, MD;  Location: [place] CV LAB;  Service: Cardiovascular;  Laterality: N/A;  . CARDIOVERSION N/A 07/24/2017   Procedure: CARDIOVERSION;  Surgeon: Thayer Headings, MD;  Location: [facility] ENDOSCOPY;  Service: Cardiovascular;  Laterality: N/A;  . COLONOSCOPY N/A 11/08/2017   Procedure: COLONOSCOPY;  Surgeon: Milus Banister, MD;  Location: [facility] ENDOSCOPY;  Service: Endoscopy;  Laterality: N/A;  . DILATION AND CURETTAGE OF UTERUS    . EP IMPLANTABLE DEVICE N/A 10/06/2015   Procedure: BIV ICD Generator Changeout;  Surgeon: [name], MD;  Location: [place] CV LAB;  Service: Cardiovascular;  Laterality: N/A;  . ESOPHAGOGASTRODUODENOSCOPY N/A 10/26/2017   Procedure: ESOPHAGOGASTRODUODENOSCOPY (EGD);  Surgeon: Ladene Artist, MD;  Location: [facility] ENDOSCOPY;  Service: Endoscopy;  Laterality: N/A;  . ESOPHAGOGASTRODUODENOSCOPY (EGD) WITH PROPOFOL N/A 11/07/2017   Procedure: ESOPHAGOGASTRODUODENOSCOPY (EGD) WITH PROPOFOL;  Surgeon: Milus Banister, MD;  Location: [facility] ENDOSCOPY;  Service: Endoscopy;  Laterality: N/A;  . HOT HEMOSTASIS N/A 10/26/2017   Procedure: HOT HEMOSTASIS (ARGON PLASMA COAGULATION/BICAP);  Surgeon: Ladene Artist, MD;  Location: [facility] ENDOSCOPY;  Service: Endoscopy;  Laterality: N/A;  . INSERT / REPLACE / REMOVE PACEMAKER  ?2008  . POLYPECTOMY  11/08/2017   Procedure: POLYPECTOMY;  Surgeon: Milus Banister, MD;   Location: [facility] ENDOSCOPY;  Service: Endoscopy;;  . PROCTOSCOPY N/A 05/22/2018   Procedure: RIGID PROCTOSCOPY;  Surgeon: Michael Boston, MD;  Location: [facility] ORS;  Service: General;  Laterality: N/A;  . RIGHT HEART CATH N/A 02/13/2018   Procedure: RIGHT HEART CATH;  Surgeon: [name],  MD;  Location: [place] CV LAB;  Service: Cardiovascular;  Laterality: N/A;  . TUBAL LIGATION    . XI ROBOTIC ASSISTED LOWER ANTERIOR RESECTION N/A 05/22/2018   Procedure: XI ROBOTIC ASSISTED SIGMOID COLOECTOMY MOBILIZATION OF SPLENIC FLEXURE, FIREFLY ASSESSMENT OF PERFUSION;  Surgeon: Michael Boston, MD;  Location: [facility] ORS;  Service: General;  Laterality: N/A;  ERAS PATHWAY    Social History   Socioeconomic History  . Marital status: Divorced    Spouse name: Not on file  . Number of children: 1  . Years of education: 14  . Highest education level: Not on file  Occupational History  . Not on file  Social Needs  . Financial resource strain: Not hard at all  . Food insecurity:    Worry: Never true    Inability: Sometimes true  . Transportation needs:    Medical: No    Non-medical: No  Tobacco Use  . Smoking status: Never Smoker  . Smokeless tobacco: Never Used  Substance and Sexual Activity  . Alcohol use: Yes    Comment: 01/03/2018 "couple drinks/month"  . Drug use: Yes    Types: Marijuana    Comment: VERY INTERMITTENT   . Sexual activity: Not Currently  Lifestyle  . Physical activity:    Days per week: Not on file    Minutes per session: Not on file  . Stress: Not on file  Relationships  . Social connections:    Talks on phone: Not on file    Gets together: Not on file    Attends religious service: Not on file    Active member of club or organization: Not on file    Attends meetings of clubs or organizations: Not on file    Relationship status: Not on file  . Intimate partner violence:    Fear of current or ex partner: Not on file    Emotionally abused: Not on file    Physically  abused: Not on file    Forced sexual activity: Not on file  Other Topics Concern  . Not on file  Social History Narrative   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      Home Situation: lives alone      Spiritual Beliefs: Christian - [employer]      Lifestyle: no regular exercise; poor diet      She is divorced at least one adult child   Never smoker    rare alcohol to occasional at best    no substance abuse          Family History  Problem Relation Age of Onset  . Asthma Father   . Heart attack Father   . Asthma Sister   . Lung cancer Sister   . Heart attack Mother   . Stroke Brother   . Colon cancer Neg Hx   . Esophageal cancer Neg Hx   . Pancreatic cancer Neg Hx   . Stomach cancer Neg Hx   . Liver disease Neg Hx     No current facility-administered medications for this encounter.    Current Outpatient Medications  Medication Sig Dispense Refill  . allopurinol (ZYLOPRIM) 300 MG tablet Take 300 mg by mouth daily.    [name] BIDIL 20-37.5 MG tablet TAKE 1 TABLET BY MOUTH TWICE DAILY (Patient taking differently: Take 1 tablet by mouth 2 (two) times daily. ) 180 tablet 3  . carvedilol (COREG) 25 MG tablet Take 25 mg by mouth 2 (two) times daily.  3  .  colchicine 0.6 MG tablet Take 0.6 mg by mouth daily as needed (gout flares).   0  . levothyroxine (SYNTHROID, LEVOTHROID) 125 MCG tablet Take 125 mcg by mouth daily before breakfast.    . oxyCODONE (OXY IR/ROXICODONE) 5 MG immediate release tablet Take 1 tablet (5 mg total) by mouth every 6 (six) hours as needed for moderate pain. 20 tablet 0  . pantoprazole (PROTONIX) 40 MG tablet Take 1 tablet (40 mg total) by mouth daily. 30 tablet 4  . potassium chloride 20 MEQ TBCR Take 20 mEq by mouth daily. 30 tablet 3  . sertraline (ZOLOFT) 50 MG tablet Take 1.5 tablets (75 mg total) by mouth daily. 135 tablet 3  . sodium bicarbonate 650 MG tablet Take 1 tablet (650 mg total) by mouth 2 (two) times daily. (Patient taking differently: Take  650 mg by mouth daily. ) 60 tablet 0  . torsemide (DEMADEX) 20 MG tablet Take 1 tablet (20 mg total) by mouth as needed (for 3 lb weight gain). (Patient taking differently: Take 40 mg by mouth daily. ) 30 tablet 0  . acetaminophen (TYLENOL) 325 MG tablet Take 2 tablets (650 mg total) by mouth every 6 (six) hours as needed.    [name] albuterol (PROVENTIL HFA;VENTOLIN HFA) 108 (90 Base) MCG/ACT inhaler Inhale 2 puffs into the lungs every 6 (six) hours as needed for wheezing or shortness of breath. 1 Inhaler 3  . gabapentin (NEURONTIN) 300 MG capsule Take 1 capsule (300 mg total) by mouth 3 (three) times daily. 12 capsule 0  . nitroGLYCERIN (NITROSTAT) 0.4 MG SL tablet Place 0.4 mg under the tongue every 5 (five) minutes as needed for chest pain.    . traMADol (ULTRAM) 50 MG tablet Take 1-2 tablets (50-100 mg total) by mouth every 6 (six) hours as needed for moderate pain or severe pain (mild pain). 20 tablet 0     No Known Allergies  Signed: Morton Peters, MD, FACS, MASCRS Gastrointestinal and Minimally Invasive Surgery    [address], [place], [place] [address] [phone] Main / Paging [phone] Fax   05/31/2018, 8:33 AM

## 2018-06-03 ENCOUNTER — Encounter: Payer: Self-pay | Admitting: Family Medicine

## 2018-06-03 ENCOUNTER — Ambulatory Visit (INDEPENDENT_AMBULATORY_CARE_PROVIDER_SITE_OTHER): Payer: Medicare Other | Admitting: Family Medicine

## 2018-06-03 VITALS — BP 122/80 | HR 59 | Temp 98.1°F | Ht 59.0 in | Wt 139.3 lb

## 2018-06-03 DIAGNOSIS — K5720 Diverticulitis of large intestine with perforation and abscess without bleeding: Secondary | ICD-10-CM | POA: Diagnosis not present

## 2018-06-03 DIAGNOSIS — E1159 Type 2 diabetes mellitus with other circulatory complications: Secondary | ICD-10-CM

## 2018-06-03 DIAGNOSIS — K7460 Unspecified cirrhosis of liver: Secondary | ICD-10-CM

## 2018-06-03 DIAGNOSIS — I482 Chronic atrial fibrillation, unspecified: Secondary | ICD-10-CM

## 2018-06-03 DIAGNOSIS — F3341 Major depressive disorder, recurrent, in partial remission: Secondary | ICD-10-CM

## 2018-06-03 DIAGNOSIS — N184 Chronic kidney disease, stage 4 (severe): Secondary | ICD-10-CM

## 2018-06-03 DIAGNOSIS — I10 Essential (primary) hypertension: Secondary | ICD-10-CM

## 2018-06-03 DIAGNOSIS — E1122 Type 2 diabetes mellitus with diabetic chronic kidney disease: Secondary | ICD-10-CM

## 2018-06-03 DIAGNOSIS — N189 Chronic kidney disease, unspecified: Secondary | ICD-10-CM | POA: Diagnosis not present

## 2018-06-03 DIAGNOSIS — I152 Hypertension secondary to endocrine disorders: Secondary | ICD-10-CM

## 2018-06-03 DIAGNOSIS — D631 Anemia in chronic kidney disease: Secondary | ICD-10-CM

## 2018-06-03 DIAGNOSIS — E039 Hypothyroidism, unspecified: Secondary | ICD-10-CM

## 2018-06-03 LAB — CBC
HCT: 27.3 % — ABNORMAL LOW (ref 36.0–46.0)
HEMOGLOBIN: 9.1 g/dL — AB (ref 12.0–15.0)
MCHC: 33.3 g/dL (ref 30.0–36.0)
MCV: 90.4 fl (ref 78.0–100.0)
PLATELETS: 271 10*3/uL (ref 150.0–400.0)
RBC: 3.02 Mil/uL — ABNORMAL LOW (ref 3.87–5.11)
RDW: 20.6 % — ABNORMAL HIGH (ref 11.5–15.5)
WBC: 9.7 10*3/uL (ref 4.0–10.5)

## 2018-06-03 LAB — TSH: TSH: 24.84 u[IU]/mL — ABNORMAL HIGH (ref 0.35–4.50)

## 2018-06-03 LAB — BASIC METABOLIC PANEL
BUN: 27 mg/dL — AB (ref 6–23)
CO2: 28 mEq/L (ref 19–32)
Calcium: 9.4 mg/dL (ref 8.4–10.5)
Chloride: 97 mEq/L (ref 96–112)
Creatinine, Ser: 1.87 mg/dL — ABNORMAL HIGH (ref 0.40–1.20)
GFR: 32.18 mL/min — ABNORMAL LOW (ref >60.00)
GLUCOSE: 103 mg/dL — AB (ref 70–99)
Potassium: 3.7 mEq/L (ref 3.5–5.1)
SODIUM: 136 meq/L (ref 135–145)

## 2018-06-03 NOTE — Patient Instructions (Signed)
BEFORE YOU LEAVE: -labs per orders, ask if they can draw the hep c lab for GI that is already in as well  -follow up: 3 months, cancel if other appts  Follow up with your hematologist, cardiologist and kidney doctor for hospital follow up.  Call your surgery office today about the small amount of bleeding and follow up per their recommendations.  I hope you are feeling better soon! Seek care promptly if your symptoms worsen, new concerns arise or you are not improving with treatment.

## 2018-06-03 NOTE — Progress Notes (Signed)
HPI:  Using dictation device. Unfortunately this device frequently misinterprets words/phrases.  Morgan Clay is a pleasant 73 y.o. with a PMH significant for a very complicated past medical history significant for CKD (sees nephrology, [name], for management), A. Fib (not anticoagulated 2ndary to recurrent GI bleeding), Cardiomyopathy (implanted defibrillator), HTN, OSA, HLD (sees cardiology, Dr. Caryl Comes for management), asthma, hypothyroidism, recurrent depression, DM, Hepatitis C and recent diverticulitis with abscess, anemia (sees GI and surgery for management) here for a hospital follow up. Sees oncology/hematology for chronic anemia. Per review of discharge documents and patient: Hospitalized 1/15 to 1/19  Primary admitting complaint(s): admitted by surgical team for robotic colon resection for the diverticulitis. Chronic anemia and discharge hgb was 8.4. discharge cr. 1.99. Potassium and magnesium looked good. Hgba1c was 7.1 05/20/18. Due for thyroid check. Primary admitting diagnosis (es) and treatment: underwent robotic sigmoid coloectomy 05/22/18 Follow up concerns per discharge document:  Follow up with surgery Reports today: doing well with fatigue less. Bowels good. Healing well she feels - occ fresh blood at incision site minimal. Reports mood ok despite recent events. Denies:sob, cp, sig bleeding, excessive fatigue, fevers On review of chart, also needs GI follow up for further evaluation and treatment of cirrhosis/HCV. Looks like they ordered labs, sees notes from CT 04/03/18, but does not look like has undergone this workup secondary to the surgery. Reports was aware needed to be done, but thought was done with surgery labs. Reports prior treatment for hep C.  Reports has upcoming appointments with cardiology and nephrology.   ROS: See pertinent positives and negatives per HPI.  Past Medical History:  Diagnosis Date  . A-fib ([place])    on Eliquis  . AICD (automatic  cardioverter/defibrillator) present 10/06/2015  . Anemia   . ARF (acute renal failure) ([place])   . Arthritis    "qwhere" (01/03/2018)  . Asthma    reports mild asthma since childhood - had COPD on dx list from prior PCP  . Chronic bronchitis ([place])    "get it most years; not this past year though" (01/03/2018)  . Chronic kidney disease    "? stage;  followed by [facility]; [name]" (01/03/2018)  . Chronic systolic congestive heart failure ([place])   . DEPRESSION 12/17/2009   Annotation: PHQ-9 score = 14 done on 12/17/2009 Qualifier: Diagnosis of  By: Hassell Done FNP, Tori Milks    . Diabetes mellitus without complication (HCC)    DIET CONTROLLED   . Diverticulosis   . Enteritis   . FIBROIDS, UTERUS 03/05/2008  . GI bleeding 01/03/2018  . Glaucoma   . Gout   . Hepatitis C    HEPATITIS C - s/p treatment with Harvoni, saw hepatology, Dawn Drazek  . History of blood transfusion ~ 11/2017  . Hyperlipemia 12/06/2012  . HYPERTENSION, BENIGN 04/24/2007  . Hypothyroidism   . LBBB (left bundle branch block)   . Lower GI bleeding    "been dealing w/it since 07/2017" (01/03/2018)  . Nonischemic cardiomyopathy ([place])   . OBESITY 05/27/2009  . OBSTRUCTIVE SLEEP APNEA 11/14/2007   no CPAP  . OSTEOPENIA 09/30/2008  . Pain and swelling of left upper extremity 08/08/2017  . Personal history of other infectious and parasitic disease    Hepatitis B  . Pneumonia    "several times" (01/03/2018)  . Pulmonary edema, acute ([place]) 08/09/2017    Past Surgical History:  Procedure Laterality Date  . APPENDECTOMY    . CARDIAC CATHETERIZATION    . CARDIOVERSION N/A 12/14/2014   Procedure:  CARDIOVERSION;  Surgeon: Dorothy Spark, MD;  Location: [facility] ENDOSCOPY;  Service: Cardiovascular;  Laterality: N/A;  . CARDIOVERSION N/A 02/26/2017   Procedure: CARDIOVERSION;  Surgeon: Evans Lance, MD;  Location: [facility] CV LAB;  Service: Cardiovascular;  Laterality: N/A;  . CARDIOVERSION N/A 07/24/2017   Procedure:  CARDIOVERSION;  Surgeon: Thayer Headings, MD;  Location: [facility] ENDOSCOPY;  Service: Cardiovascular;  Laterality: N/A;  . COLONOSCOPY N/A 11/08/2017   Procedure: COLONOSCOPY;  Surgeon: Milus Banister, MD;  Location: [facility] ENDOSCOPY;  Service: Endoscopy;  Laterality: N/A;  . DILATION AND CURETTAGE OF UTERUS    . EP IMPLANTABLE DEVICE N/A 10/06/2015   Procedure: BIV ICD Generator Changeout;  Surgeon: [name], MD;  Location: [facility] LAB;  Service: Cardiovascular;  Laterality: N/A;  . ESOPHAGOGASTRODUODENOSCOPY N/A 10/26/2017   Procedure: ESOPHAGOGASTRODUODENOSCOPY (EGD);  Surgeon: Ladene Artist, MD;  Location: [facility] ENDOSCOPY;  Service: Endoscopy;  Laterality: N/A;  . ESOPHAGOGASTRODUODENOSCOPY (EGD) WITH PROPOFOL N/A 11/07/2017   Procedure: ESOPHAGOGASTRODUODENOSCOPY (EGD) WITH PROPOFOL;  Surgeon: Milus Banister, MD;  Location: [facility] ENDOSCOPY;  Service: Endoscopy;  Laterality: N/A;  . HOT HEMOSTASIS N/A 10/26/2017   Procedure: HOT HEMOSTASIS (ARGON PLASMA COAGULATION/BICAP);  Surgeon: Ladene Artist, MD;  Location: [facility] ENDOSCOPY;  Service: Endoscopy;  Laterality: N/A;  . INSERT / REPLACE / REMOVE PACEMAKER  ?2008  . POLYPECTOMY  11/08/2017   Procedure: POLYPECTOMY;  Surgeon: Milus Banister, MD;  Location: [facility] ENDOSCOPY;  Service: Endoscopy;;  . PROCTOSCOPY N/A 05/22/2018   Procedure: RIGID PROCTOSCOPY;  Surgeon: Michael Boston, MD;  Location: [facility] ORS;  Service: General;  Laterality: N/A;  . RIGHT HEART CATH N/A 02/13/2018   Procedure: RIGHT HEART CATH;  Surgeon: [name], MD;  Location: [facility] LAB;  Service: Cardiovascular;  Laterality: N/A;  . TUBAL LIGATION    . XI ROBOTIC ASSISTED LOWER ANTERIOR RESECTION N/A 05/22/2018   Procedure: XI ROBOTIC ASSISTED SIGMOID COLOECTOMY MOBILIZATION OF SPLENIC FLEXURE, FIREFLY ASSESSMENT OF PERFUSION;  Surgeon: Michael Boston, MD;  Location: [facility] ORS;  Service: General;  Laterality: N/A;  ERAS PATHWAY    Family History  Problem Relation Age of  Onset  . Asthma Father   . Heart attack Father   . Asthma Sister   . Lung cancer Sister   . Heart attack Mother   . Stroke Brother   . Colon cancer Neg Hx   . Esophageal cancer Neg Hx   . Pancreatic cancer Neg Hx   . Stomach cancer Neg Hx   . Liver disease Neg Hx     SOCIAL HX: see hpi   Current Outpatient Medications:  .  acetaminophen (TYLENOL) 325 MG tablet, Take 2 tablets (650 mg total) by mouth every 6 (six) hours as needed., Disp: , Rfl:  .  albuterol (PROVENTIL HFA;VENTOLIN HFA) 108 (90 Base) MCG/ACT inhaler, Inhale 2 puffs into the lungs every 6 (six) hours as needed for wheezing or shortness of breath., Disp: 1 Inhaler, Rfl: 3 .  allopurinol (ZYLOPRIM) 300 MG tablet, Take 300 mg by mouth daily., Disp: , Rfl:  .  BIDIL 20-37.5 MG tablet, TAKE 1 TABLET BY MOUTH TWICE DAILY (Patient taking differently: Take 1 tablet by mouth 2 (two) times daily. ), Disp: 180 tablet, Rfl: 3 .  carvedilol (COREG) 25 MG tablet, Take 25 mg by mouth 2 (two) times daily., Disp: , Rfl: 3 .  colchicine 0.6 MG tablet, Take 0.6 mg by mouth daily as needed (gout flares). , Disp: ,  Rfl: 0 .  gabapentin (NEURONTIN) 300 MG capsule, Take 1 capsule (300 mg total) by mouth 3 (three) times daily., Disp: 12 capsule, Rfl: 0 .  levothyroxine (SYNTHROID, LEVOTHROID) 125 MCG tablet, Take 125 mcg by mouth daily before breakfast., Disp: , Rfl:  .  nitroGLYCERIN (NITROSTAT) 0.4 MG SL tablet, Place 0.4 mg under the tongue every 5 (five) minutes as needed for chest pain., Disp: , Rfl:  .  oxyCODONE (OXY IR/ROXICODONE) 5 MG immediate release tablet, Take 1 tablet (5 mg total) by mouth every 6 (six) hours as needed for moderate pain., Disp: 20 tablet, Rfl: 0 .  pantoprazole (PROTONIX) 40 MG tablet, Take 1 tablet (40 mg total) by mouth daily., Disp: 30 tablet, Rfl: 4 .  potassium chloride 20 MEQ TBCR, Take 20 mEq by mouth daily., Disp: 30 tablet, Rfl: 3 .  sertraline (ZOLOFT) 50 MG tablet, Take 1.5 tablets (75 mg total) by  mouth daily., Disp: 135 tablet, Rfl: 3 .  sodium bicarbonate 650 MG tablet, Take 1 tablet (650 mg total) by mouth 2 (two) times daily. (Patient taking differently: Take 650 mg by mouth daily. ), Disp: 60 tablet, Rfl: 0 .  torsemide (DEMADEX) 20 MG tablet, Take 1 tablet (20 mg total) by mouth as needed (for 3 lb weight gain). (Patient taking differently: Take 40 mg by mouth daily. ), Disp: 30 tablet, Rfl: 0 .  traMADol (ULTRAM) 50 MG tablet, Take 1-2 tablets (50-100 mg total) by mouth every 6 (six) hours as needed for moderate pain or severe pain (mild pain)., Disp: 20 tablet, Rfl: 0  EXAM:  Vitals:   06/03/18 1107  BP: 122/80  Pulse: (!) 59  Temp: 98.1 F (36.7 C)  SpO2: 95%    Body mass index is 28.14 kg/m.  GENERAL: vitals reviewed and listed above, alert, oriented, appears well hydrated and in no acute distress  HEENT: atraumatic, conjunttiva clear, no obvious abnormalities on inspection of external nose and ears  NECK: no obvious masses on inspection  LUNGS: clear to auscultation bilaterally, no wheezes, rales or rhonchi, good air movement  CV: HRRR, no peripheral edema  MS: moves all extremities without noticeable abnormality  SKIN: clean and dry, healing surgical site lower R abd, no sign of infection, swelling or pus, small amount red blood on dressing only.  PSYCH: pleasant and cooperative, no obvious depression or anxiety  ASSESSMENT AND PLAN:  Discussed the following assessment and plan: More than 50% of over 40 minutes spent in total in caring for this patient was spent face-to-face with the patient, counseling and/or coordinating care.    CKD (chronic kidney disease) stage 4, GFR 15-29 ml/min (HCC) - Plan: Basic metabolic panel  Anemia in chronic kidney disease, unspecified CKD stage - Plan: CBC  Diverticulitis of large intestine with abscess without bleeding  MDD (major depressive disorder), recurrent, in partial remission (HCC)  Atrial fibrillation,  chronic  Hypertension associated with diabetes ([place])  Controlled type 2 diabetes mellitus with stage 4 chronic kidney disease, without long-term current use of insulin (HCC)  Hypothyroidism, unspecified type - Plan: TSH  -due for thyroid recheck, hep c labs per GI recommendations and recheck kidney function and anemia after recent hospitalizations, she agrees to do here today and we will plan to forward to her specialists -needs follow up with surgery - she agrees to do so, reports seems to be recovering well -need nephrology and cardiology follow up and she reports scheduled for February -follow up here 3 months -Patient advised to  return or notify a doctor immediately if symptoms worsen or persist or new concerns arise.  There are no Patient Instructions on file for this visit.  [name], DO

## 2018-06-06 LAB — HCV RNA QUANT: Hepatitis C Quantitation: NOT DETECTED IU/mL

## 2018-06-07 ENCOUNTER — Inpatient Hospital Stay: Payer: Medicare Other

## 2018-06-07 ENCOUNTER — Telehealth: Payer: Self-pay

## 2018-06-07 ENCOUNTER — Inpatient Hospital Stay: Payer: Medicare Other | Attending: Oncology | Admitting: Oncology

## 2018-06-07 VITALS — BP 140/61 | HR 61 | Temp 97.6°F | Resp 17 | Ht 59.0 in | Wt 140.4 lb

## 2018-06-07 DIAGNOSIS — R5383 Other fatigue: Secondary | ICD-10-CM | POA: Insufficient documentation

## 2018-06-07 DIAGNOSIS — D509 Iron deficiency anemia, unspecified: Secondary | ICD-10-CM | POA: Diagnosis not present

## 2018-06-07 DIAGNOSIS — Z79899 Other long term (current) drug therapy: Secondary | ICD-10-CM

## 2018-06-07 DIAGNOSIS — K529 Noninfective gastroenteritis and colitis, unspecified: Secondary | ICD-10-CM | POA: Diagnosis not present

## 2018-06-07 DIAGNOSIS — N189 Chronic kidney disease, unspecified: Secondary | ICD-10-CM | POA: Diagnosis not present

## 2018-06-07 DIAGNOSIS — D649 Anemia, unspecified: Secondary | ICD-10-CM

## 2018-06-07 DIAGNOSIS — I129 Hypertensive chronic kidney disease with stage 1 through stage 4 chronic kidney disease, or unspecified chronic kidney disease: Secondary | ICD-10-CM | POA: Diagnosis not present

## 2018-06-07 DIAGNOSIS — D631 Anemia in chronic kidney disease: Secondary | ICD-10-CM

## 2018-06-07 DIAGNOSIS — R531 Weakness: Secondary | ICD-10-CM | POA: Insufficient documentation

## 2018-06-07 LAB — CBC WITH DIFFERENTIAL (CANCER CENTER ONLY)
Abs Immature Granulocytes: 0.06 10*3/uL (ref 0.00–0.07)
Basophils Absolute: 0 10*3/uL (ref 0.0–0.1)
Basophils Relative: 0 %
Eosinophils Absolute: 0.6 10*3/uL — ABNORMAL HIGH (ref 0.0–0.5)
Eosinophils Relative: 7 %
HCT: 27.7 % — ABNORMAL LOW (ref 36.0–46.0)
Hemoglobin: 8.9 g/dL — ABNORMAL LOW (ref 12.0–15.0)
Immature Granulocytes: 1 %
Lymphocytes Relative: 9 %
Lymphs Abs: 0.8 10*3/uL (ref 0.7–4.0)
MCH: 29.6 pg (ref 26.0–34.0)
MCHC: 32.1 g/dL (ref 30.0–36.0)
MCV: 92 fL (ref 80.0–100.0)
Monocytes Absolute: 0.8 10*3/uL (ref 0.1–1.0)
Monocytes Relative: 9 %
Neutro Abs: 6.4 10*3/uL (ref 1.7–7.7)
Neutrophils Relative %: 74 %
PLATELETS: 276 10*3/uL (ref 150–400)
RBC: 3.01 MIL/uL — AB (ref 3.87–5.11)
RDW: 19.6 % — ABNORMAL HIGH (ref 11.5–15.5)
WBC: 8.6 10*3/uL (ref 4.0–10.5)
nRBC: 0 % (ref 0.0–0.2)

## 2018-06-07 LAB — FERRITIN: Ferritin: 466 ng/mL — ABNORMAL HIGH (ref 11–307)

## 2018-06-07 LAB — IRON AND TIBC
Iron: 74 ug/dL (ref 41–142)
Saturation Ratios: 24 % (ref 21–57)
TIBC: 315 ug/dL (ref 236–444)
UIBC: 241 ug/dL (ref 120–384)

## 2018-06-07 NOTE — Progress Notes (Signed)
Hematology and Oncology Follow Up Visit  Morgan Clay [id number] 1945/11/18 73 y.o. 06/07/2018 1:20 PM Morgan Fill, DO   Principle Diagnosis: 73 year old woman with anemia related to renal insufficiency as well as iron deficiency diagnosed and July 2019.    Prior Therapy: Feraheme infusion intermittently as well as packed red cell transfusion.  Current therapy: Aranesp 300 mcg to keep her hemoglobin above 11.   Interim History: Morgan Clay returns today for repeat evaluation.  Since her last visit, she underwent robotic assisted lower anterior rectosigmoid resection of the colon under the care of Dr. gross on May 22, 2018.  He tolerated the procedure well and discharged on January 19 of 2020.  Since her discharge she reports feeling reasonably well without any recent complaints.  She still reports some abdominal distention and pain but is healing very well at this time.  He does report some weakness and tiredness and feels that she might need another iron infusion.  Patient denied any alteration mental status, neuropathy, confusion or dizziness.  Denies any headaches or lethargy.  Denies any night sweats, weight loss or changes in appetite.  Denied orthopnea, dyspnea on exertion or chest discomfort.  Denies shortness of breath, difficulty breathing hemoptysis or cough.  Denies any abdominal distention, nausea, early satiety or dyspepsia.  Denies any hematuria, frequency, dysuria or nocturia.  Denies any skin irritation, dryness or rash.  Denies any ecchymosis or petechiae.  Denies any lymphadenopathy or clotting.  Denies any heat or cold intolerance.  Denies any anxiety or depression.  Remaining review of system is negative.     Medications: Current Outpatient Medications  Medication Sig Dispense Refill  . acetaminophen (TYLENOL) 325 MG tablet Take 2 tablets (650 mg total) by mouth every 6 (six) hours as needed.    [name] albuterol (PROVENTIL HFA;VENTOLIN HFA) 108 (90  Base) MCG/ACT inhaler Inhale 2 puffs into the lungs every 6 (six) hours as needed for wheezing or shortness of breath. 1 Inhaler 3  . allopurinol (ZYLOPRIM) 300 MG tablet Take 300 mg by mouth daily.    [name] BIDIL 20-37.5 MG tablet TAKE 1 TABLET BY MOUTH TWICE DAILY (Patient taking differently: Take 1 tablet by mouth 2 (two) times daily. ) 180 tablet 3  . carvedilol (COREG) 25 MG tablet Take 25 mg by mouth 2 (two) times daily.  3  . colchicine 0.6 MG tablet Take 0.6 mg by mouth daily as needed (gout flares).   0  . gabapentin (NEURONTIN) 300 MG capsule Take 1 capsule (300 mg total) by mouth 3 (three) times daily. 12 capsule 0  . levothyroxine (SYNTHROID, LEVOTHROID) 125 MCG tablet Take 125 mcg by mouth daily before breakfast.    . nitroGLYCERIN (NITROSTAT) 0.4 MG SL tablet Place 0.4 mg under the tongue every 5 (five) minutes as needed for chest pain.    [name] oxyCODONE (OXY IR/ROXICODONE) 5 MG immediate release tablet Take 1 tablet (5 mg total) by mouth every 6 (six) hours as needed for moderate pain. 20 tablet 0  . pantoprazole (PROTONIX) 40 MG tablet Take 1 tablet (40 mg total) by mouth daily. 30 tablet 4  . potassium chloride 20 MEQ TBCR Take 20 mEq by mouth daily. 30 tablet 3  . sertraline (ZOLOFT) 50 MG tablet Take 1.5 tablets (75 mg total) by mouth daily. 135 tablet 3  . sodium bicarbonate 650 MG tablet Take 1 tablet (650 mg total) by mouth 2 (two) times daily. (Patient taking differently: Take 650 mg by mouth daily. )  60 tablet 0  . torsemide (DEMADEX) 20 MG tablet Take 1 tablet (20 mg total) by mouth as needed (for 3 lb weight gain). (Patient taking differently: Take 40 mg by mouth daily. ) 30 tablet 0  . traMADol (ULTRAM) 50 MG tablet Take 1-2 tablets (50-100 mg total) by mouth every 6 (six) hours as needed for moderate pain or severe pain (mild pain). 20 tablet 0   No current facility-administered medications for this visit.      Allergies: No Known Allergies  Past Medical History, Surgical  history, Social history, and Family History were reviewed and updated.    Physical Exam:  Blood pressure 140/61, pulse 61, temperature 97.6 F (36.4 C), temperature source Oral, resp. rate 17, height 4\' 11"  (1.499 m), weight 140 lb 6.4 oz (63.7 kg), SpO2 98 %.   ECOG: 1   General appearance: Alert, awake without any distress. Head: Atraumatic without abnormalities Oropharynx: Without any thrush or ulcers. Eyes: No scleral icterus. Lymph nodes: No lymphadenopathy noted in the cervical, supraclavicular, or axillary nodes Heart:regular rate and rhythm, without any murmurs or gallops.   Lung: Clear to auscultation without any rhonchi, wheezes or dullness to percussion. Abdomin: Soft, nontender without any shifting dullness or ascites. Musculoskeletal: No clubbing or cyanosis. Neurological: No motor or sensory deficits. Skin: No rashes or lesions.   Lab Results: Lab Results  Component Value Date   WBC 9.7 06/03/2018   HGB 9.1 (L) 06/03/2018   HCT 27.3 (L) 06/03/2018   MCV 90.4 06/03/2018   PLT 271.0 06/03/2018     Chemistry      Component Value Date/Time   NA 136 06/03/2018 1148   NA 138 12/31/2017 0846   K 3.7 06/03/2018 1148   CL 97 06/03/2018 1148   CO2 28 06/03/2018 1148   BUN 27 (H) 06/03/2018 1148   BUN 99 (HH) 12/31/2017 0846   CREATININE 1.87 (H) 06/03/2018 1148   CREATININE 2.36 (H) 11/23/2017 1132   CREATININE 1.26 (H) 02/25/2016 1607   GLU 106 12/20/2016      Component Value Date/Time   CALCIUM 9.4 06/03/2018 1148   ALKPHOS 70 03/05/2018 0555   AST 29 03/05/2018 0555   AST 14 (L) 11/23/2017 1132   ALT 23 03/05/2018 0555   ALT 9 11/23/2017 1132   BILITOT 1.1 03/05/2018 0555   BILITOT 0.3 11/23/2017 1132         Impression and Plan:  74 year old woman with:  1.  Anemia related to renal insufficiency and iron deficiency.  She is currently receiving Aranesp to keep her hemoglobin above 11.   Her hemoglobin on January 27 showed 9.1 with  improvement since her recent operation.  I have repeated iron studies today and currently pending.  Risks and benefits of intravenous iron was discussed today and depending on the results we will determine whether she will require intravenous iron at this time.  Once her iron stores are repleted, Aranesp will be restarted in the future if needed.  She is agreeable with this plan.  She is also agreeable to receive intravenous iron if needed to.  2.  Colitis: She is status post colonic resection.  3.  Follow-up: We will be in 3 months to follow her progress.  15  minutes was spent with the patient face-to-face today.  More than 50% of time was dedicated to discussing her disease status, treatment options and coordinating future plan of care.    Zola Button, MD 1/31/20201:20 PM

## 2018-06-07 NOTE — Telephone Encounter (Signed)
Printed avs and calender of upcoming appointment. Per 1/31 los 

## 2018-06-07 NOTE — Progress Notes (Signed)
Re route to PCP

## 2018-06-10 NOTE — Progress Notes (Signed)
No ICM remote transmission received for 05/30/2018 and next ICM transmission scheduled for 06/26/2018.

## 2018-06-11 ENCOUNTER — Telehealth: Payer: Self-pay

## 2018-06-11 MED ORDER — LEVOTHYROXINE SODIUM 137 MCG PO TABS: 137.0000 ug | ORAL_TABLET | Freq: Every day | ORAL | 5 refills | Status: DC

## 2018-06-11 NOTE — Addendum Note (Signed)
Addended by: Agnes Lawrence on: 06/11/2018 04:50 PM   Modules accepted: Orders

## 2018-06-11 NOTE — Telephone Encounter (Signed)
Received call from patient requesting lab results and if an iron infusion is necessary. Communicated to the patient that per Dr. Alen Blew the iron studies were normal and there is no need for iron infusions at this time. Patient verbalized understanding.

## 2018-06-12 DIAGNOSIS — I129 Hypertensive chronic kidney disease with stage 1 through stage 4 chronic kidney disease, or unspecified chronic kidney disease: Secondary | ICD-10-CM | POA: Diagnosis not present

## 2018-06-12 DIAGNOSIS — M109 Gout, unspecified: Secondary | ICD-10-CM | POA: Diagnosis not present

## 2018-06-12 DIAGNOSIS — R809 Proteinuria, unspecified: Secondary | ICD-10-CM | POA: Diagnosis not present

## 2018-06-12 DIAGNOSIS — N179 Acute kidney failure, unspecified: Secondary | ICD-10-CM | POA: Diagnosis not present

## 2018-06-12 DIAGNOSIS — N183 Chronic kidney disease, stage 3 (moderate): Secondary | ICD-10-CM | POA: Diagnosis not present

## 2018-06-12 DIAGNOSIS — D631 Anemia in chronic kidney disease: Secondary | ICD-10-CM | POA: Diagnosis not present

## 2018-06-12 NOTE — Addendum Note (Signed)
Addended by: Agnes Lawrence on: 06/12/2018 04:37 PM   Modules accepted: Orders

## 2018-06-13 ENCOUNTER — Telehealth: Payer: Self-pay | Admitting: *Deleted

## 2018-06-13 NOTE — Telephone Encounter (Signed)
Copied from [place] [phone]. Topic: General - Inquiry >> Jun 12, 2018  2:55 PM Margot Ables wrote: Reason for CRM: pt states she missed call from the office and thinks it was Wendie Simmer. Please call again.

## 2018-06-13 NOTE — Telephone Encounter (Signed)
See results note from 2/5.

## 2018-06-14 ENCOUNTER — Other Ambulatory Visit: Payer: Self-pay | Admitting: Internal Medicine

## 2018-06-18 ENCOUNTER — Other Ambulatory Visit: Payer: Self-pay | Admitting: Surgery

## 2018-06-18 DIAGNOSIS — N289 Disorder of kidney and ureter, unspecified: Secondary | ICD-10-CM | POA: Diagnosis not present

## 2018-06-18 DIAGNOSIS — D649 Anemia, unspecified: Secondary | ICD-10-CM | POA: Diagnosis not present

## 2018-06-18 DIAGNOSIS — R109 Unspecified abdominal pain: Secondary | ICD-10-CM

## 2018-06-19 ENCOUNTER — Ambulatory Visit
Admission: RE | Admit: 2018-06-19 | Discharge: 2018-06-19 | Disposition: A | Discharge status: Home / Self Care | Payer: Medicare Other | Source: Ambulatory Visit | Attending: Surgery | Admitting: Surgery

## 2018-06-19 DIAGNOSIS — R109 Unspecified abdominal pain: Secondary | ICD-10-CM | POA: Diagnosis not present

## 2018-06-26 ENCOUNTER — Ambulatory Visit (INDEPENDENT_AMBULATORY_CARE_PROVIDER_SITE_OTHER): Payer: Medicare Other

## 2018-06-26 DIAGNOSIS — Z95810 Presence of automatic (implantable) cardiac defibrillator: Secondary | ICD-10-CM | POA: Diagnosis not present

## 2018-06-26 DIAGNOSIS — I5032 Chronic diastolic (congestive) heart failure: Secondary | ICD-10-CM

## 2018-06-26 NOTE — Progress Notes (Signed)
EPIC Encounter for ICM Monitoring  Patient Name: Morgan Clay is a 73 y.o. female Date: 06/26/2018 Primary Care Physican: [name], DO Primary [place] Electrophysiologist: Caryl Comes Nephrologist: [name] Bi-V Pacing: 99.6% Last Weight:149lbs  Today's Weight:136 lbs    Heart failure questions reviewed. She denied any symptoms and feeling much better since having colon resection surgery last month.      Thoracic impedance abnormal suggesting fluid accumulation since ~03/01/2018 with exception of ~ 4-5 days at baseline.  Prescribed:Torsemide 20 mg take 40 mg daily. Potassium 20 mEq take 1 tablet daily.   Labs: 06/03/2018 Creatinine 1.87, BUN 27, Potassium 3.7, Sodium 136 05/26/2018 Creatinine 1.99, Potassium 3.7, GFR 25-29 05/25/2018 Creatinine 2.22, Potassium 4.2, GFR 22-25  05/24/2018 Creatinine 2.64, Potassium 4.2, GFR 18-20  05/23/2018 Creatinine 2.34, BUN 41, Potassium 4.3, Sodium 135, GFR 20-24  05/20/2018 Creatinine 2.34, BUN 41, Potassium 4.3, Sodium 135, GFR 28-32  04/18/2018 Creatinine 2.11, BUN 35, Potassium 3.8, Sodium 142, GFR 23-27  03/19/2018 Creatinine2.30, BUN28, Potassium3.9, Sodium141 A complete set of results can be found in Results Review.  Recommendations: Advised to limit salt and fluid intake.    Follow-up plan: ICM clinic phone appointment on3/07/2018 to recheck fluid levels. Office visit with Dr [name] 07/01/2018.  Copy of ICM check sent to Dr.Klein and Dr [name] for review and if any recommendations will call back.   3 month ICM trend: 06/26/2018    1 Year ICM trend:       [name], RN 06/26/2018 12:50 PM

## 2018-06-27 ENCOUNTER — Ambulatory Visit: Payer: Medicare Other | Admitting: Family Medicine

## 2018-06-27 ENCOUNTER — Ambulatory Visit: Payer: Medicare Other

## 2018-06-28 MED ORDER — TORSEMIDE 20 MG PO TABS: ORAL_TABLET | ORAL | 2 refills | Status: DC

## 2018-06-28 NOTE — Progress Notes (Signed)
Can increase torsemide to 40 qam/20 qpm with BMET next week.

## 2018-06-28 NOTE — Progress Notes (Signed)
Call to patient and advised Dr [name] recommended to increase Torsemide 20 mg to 2 tablets (40 mg total) in AM and 1 tablet (20 mg total) every PM.  She has enough supply on hand and will call pharmacy when refill is needed.  Also advised to have BMET drawn next week. She said she has an office visit with Dr [name] on Monday and will get lab done that day if okay with Dr [name].

## 2018-07-01 ENCOUNTER — Ambulatory Visit (HOSPITAL_COMMUNITY)
Admission: RE | Admit: 2018-07-01 | Discharge: 2018-07-01 | Disposition: A | Discharge status: Home / Self Care | Payer: Medicare Other | Source: Ambulatory Visit | Attending: Cardiology | Admitting: Cardiology

## 2018-07-01 VITALS — BP 138/70 | HR 64 | Wt 141.0 lb

## 2018-07-01 DIAGNOSIS — K254 Chronic or unspecified gastric ulcer with hemorrhage: Secondary | ICD-10-CM | POA: Insufficient documentation

## 2018-07-01 DIAGNOSIS — G4733 Obstructive sleep apnea (adult) (pediatric): Secondary | ICD-10-CM | POA: Insufficient documentation

## 2018-07-01 DIAGNOSIS — I130 Hypertensive heart and chronic kidney disease with heart failure and stage 1 through stage 4 chronic kidney disease, or unspecified chronic kidney disease: Secondary | ICD-10-CM | POA: Insufficient documentation

## 2018-07-01 DIAGNOSIS — Z8249 Family history of ischemic heart disease and other diseases of the circulatory system: Secondary | ICD-10-CM | POA: Insufficient documentation

## 2018-07-01 DIAGNOSIS — Z79890 Hormone replacement therapy (postmenopausal): Secondary | ICD-10-CM | POA: Diagnosis not present

## 2018-07-01 DIAGNOSIS — Z801 Family history of malignant neoplasm of trachea, bronchus and lung: Secondary | ICD-10-CM | POA: Insufficient documentation

## 2018-07-01 DIAGNOSIS — Z79899 Other long term (current) drug therapy: Secondary | ICD-10-CM | POA: Insufficient documentation

## 2018-07-01 DIAGNOSIS — E1122 Type 2 diabetes mellitus with diabetic chronic kidney disease: Secondary | ICD-10-CM | POA: Insufficient documentation

## 2018-07-01 DIAGNOSIS — I428 Other cardiomyopathies: Secondary | ICD-10-CM

## 2018-07-01 DIAGNOSIS — N183 Chronic kidney disease, stage 3 (moderate): Secondary | ICD-10-CM | POA: Diagnosis not present

## 2018-07-01 DIAGNOSIS — I4892 Unspecified atrial flutter: Secondary | ICD-10-CM | POA: Diagnosis not present

## 2018-07-01 DIAGNOSIS — I5032 Chronic diastolic (congestive) heart failure: Secondary | ICD-10-CM | POA: Diagnosis not present

## 2018-07-01 DIAGNOSIS — E669 Obesity, unspecified: Secondary | ICD-10-CM | POA: Insufficient documentation

## 2018-07-01 DIAGNOSIS — M109 Gout, unspecified: Secondary | ICD-10-CM | POA: Insufficient documentation

## 2018-07-01 DIAGNOSIS — F329 Major depressive disorder, single episode, unspecified: Secondary | ICD-10-CM | POA: Insufficient documentation

## 2018-07-01 DIAGNOSIS — Z6828 Body mass index (BMI) 28.0-28.9, adult: Secondary | ICD-10-CM | POA: Diagnosis not present

## 2018-07-01 DIAGNOSIS — I484 Atypical atrial flutter: Secondary | ICD-10-CM | POA: Diagnosis not present

## 2018-07-01 LAB — BASIC METABOLIC PANEL
ANION GAP: 13 (ref 5–15)
BUN: 45 mg/dL — ABNORMAL HIGH (ref 8–23)
CO2: 26 mmol/L (ref 22–32)
Calcium: 9.4 mg/dL (ref 8.9–10.3)
Chloride: 99 mmol/L (ref 98–111)
Creatinine, Ser: 2 mg/dL — ABNORMAL HIGH (ref 0.44–1.00)
GFR calc non Af Amer: 25 mL/min — ABNORMAL LOW (ref >60)
GFR, EST AFRICAN AMERICAN: 29 mL/min — AB (ref >60)
Glucose, Bld: 107 mg/dL — ABNORMAL HIGH (ref 70–99)
Potassium: 4.1 mmol/L (ref 3.5–5.1)
Sodium: 138 mmol/L (ref 135–145)

## 2018-07-01 NOTE — Progress Notes (Signed)
ReDS Vest - 07/01/18 1500      ReDS Vest   Fitting Posture  Sitting    Ruler Value  27    ReDS Value  35

## 2018-07-01 NOTE — Patient Instructions (Signed)
Your physician request that you have a scan of your heart (PYP scan).  You will be called to scheduled for this scan  Labs today and repeat in 2 weeks We will only contact you if something comes back abnormal or we need to make some changes. Otherwise no news is good news!  Your physician recommends that you schedule a follow-up appointment in: 1 month with NP/PA

## 2018-07-02 ENCOUNTER — Telehealth (HOSPITAL_COMMUNITY): Payer: Self-pay

## 2018-07-02 DIAGNOSIS — I5032 Chronic diastolic (congestive) heart failure: Secondary | ICD-10-CM

## 2018-07-02 MED ORDER — TORSEMIDE 20 MG PO TABS: ORAL_TABLET | ORAL | 6 refills | Status: DC

## 2018-07-02 NOTE — Telephone Encounter (Signed)
-----   Message from [name], MD sent at 07/01/2018 10:45 PM EST ----- Creatinine up.  Decrease torsemide to 40 mg daily, BMET 1 week.

## 2018-07-02 NOTE — Telephone Encounter (Signed)
Creatinine 2.00  Pt aware of results.  Home dose verified with patient, she was taking Torsemide 40mg  daily.  D/w in office today, patient should alternate Torsemide 40mg  and 20mg  every other day.  Pt will RTC On Tuesday for blood work. Pt appreciative and amendable to plan.

## 2018-07-02 NOTE — Progress Notes (Signed)
PCP: Dr. Colin Benton Cardiology: Dr. Caryl Comes HF Cardiology: Dr. [name]  73 yo with history of nonischemic cardiomyopathy with recovery of EF with BiV pacing, recurrent GI bleeding, and paroxysmal atrial flutter was referred by Dr. Caryl Comes for evaluation of CHF.  Patient has a history of presumed nonischemic cardiomyopathy for a number of years.  Echo in 12/15 showed EF 25-30% and Cardiolite at that time showed no ischemia.  She had Medtronic CRT-D system placed, and EF recovered. In 4/19, echo showed EF 60-65%.    She has additionally had problems with GI bleeding.  She had been on Eliquis for atrial flutter but this was stopped with recurrent GI bleeding.  She had bleeding from a gastric ulcer but was also noted to have multiple small bowel AVMs on capsule endoscopy out of reach of endoscope.  Eliquis was stopped. She has had no melena or BRBPR off Eliquis.   Echo in 10/19 showed EF 55-60%, mild-moderate MR, moderate AI, moderate RV dilation.   She was admitted in 1/20 with diverticulitis and ended up having a sigmoid colectomy.  She has mild abdominal soreness since surgery, but it is improving.    She returns for followup of CHF.  Weight is stable.  Her main problem seems to be difficulty with balance.  No lightheadedness and no actual falls.  No syncope.  No chest pain.  No orthopnea/PND.  No dyspnea walking on flat ground.   Medtronic device interrogation: Fluid index > threshold, >99% BiV pacing.   REDS clip reading 35%.   Labs (9/19):  Hgb 10.2, K 3.8, creatinine 2.5 Labs (02/26/2018): K 3.4 Creatinine 2.2 WBC 76.  Labs (2/20): K 3.9, creatinine 1.8  PMH: 1. Diabetes: diet-controlled.  2. Gout 3. HCV: Treated with Harvoni.  4. Atrial flutter: Atypical.  Had DCCV in 3/19, maintained on amiodarone.  5. HTN 6. CKD stage 3: Sees nephrology, [name].  7. H/o SBO 8. Obesity 9. Nonischemic cardiomyopathy: Echo in 12/15 with EF 25-30%.  Cardiolite in 12/15 with no ischemia.  She had a  Medtronic CRT-D device placed with recovery of EF.   - Echo (4/19): EF 60-65% with moderate AI, moderate MR, and PASP 49 mmHg.  - RHC 02/13/2018 RA mean 6, RV 49/7,PA 50/18, mean 31,PCWP mean 20, Oxygen saturations:PA 67% AO 97%, Cardiac Output (Fick) 4.6, Cardiac Index (Fick) 2.9, PVR 2.4 WU - Echo (10/19): EF 55-60%, mild-moderate MR, moderate AI, moderate RV dilation, PASP 60 mmHg.  10. GI bleeding: Gastric AVM and also gastric ulcer seen in past.  - 8/19 capsule endoscopy showed small bowel AVMs beyond the reach of endoscopy.  - With recurrent episodes of GI bleeding, Eliquis was stopped.  11. Fe deficiency anemia.  12. Depression 13. OSA: Uses CPAP.  14. Diverticulitis: Sigmoid colectomy in 1/20.   Social History   Socioeconomic History  . Marital status: Divorced    Spouse name: Not on file  . Number of children: 1  . Years of education: 60  . Highest education level: Not on file  Occupational History  . Not on file  Social Needs  . Financial resource strain: Not hard at all  . Food insecurity:    Worry: Never true    Inability: Sometimes true  . Transportation needs:    Medical: No    Non-medical: No  Tobacco Use  . Smoking status: Never Smoker  . Smokeless tobacco: Never Used  Substance and Sexual Activity  . Alcohol use: Yes    Comment: 01/03/2018 "couple  drinks/month"  . Drug use: Yes    Types: Marijuana    Comment: VERY INTERMITTENT   . Sexual activity: Not Currently  Lifestyle  . Physical activity:    Days per week: Not on file    Minutes per session: Not on file  . Stress: Not on file  Relationships  . Social connections:    Talks on phone: Not on file    Gets together: Not on file    Attends religious service: Not on file    Active member of club or organization: Not on file    Attends meetings of clubs or organizations: Not on file    Relationship status: Not on file  . Intimate partner violence:    Fear of current or ex partner: Not on file     Emotionally abused: Not on file    Physically abused: Not on file    Forced sexual activity: Not on file  Other Topics Concern  . Not on file  Social History Narrative   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      Home Situation: lives alone      Spiritual Beliefs: Christian - [employer]      Lifestyle: no regular exercise; poor diet      She is divorced at least one adult child   Never smoker    rare alcohol to occasional at best    no substance abuse         Family History  Problem Relation Age of Onset  . Asthma Father   . Heart attack Father   . Asthma Sister   . Lung cancer Sister   . Heart attack Mother   . Stroke Brother   . Colon cancer Neg Hx   . Esophageal cancer Neg Hx   . Pancreatic cancer Neg Hx   . Stomach cancer Neg Hx   . Liver disease Neg Hx    ROS: All systems reviewed and negative except as per HPI.   Current Outpatient Medications  Medication Sig Dispense Refill  . acetaminophen (TYLENOL) 325 MG tablet Take 2 tablets (650 mg total) by mouth every 6 (six) hours as needed.    [name] albuterol (PROVENTIL HFA;VENTOLIN HFA) 108 (90 Base) MCG/ACT inhaler Inhale 2 puffs into the lungs every 6 (six) hours as needed for wheezing or shortness of breath. 1 Inhaler 3  . allopurinol (ZYLOPRIM) 300 MG tablet Take 300 mg by mouth daily.    [name] BIDIL 20-37.5 MG tablet TAKE 1 TABLET BY MOUTH TWICE DAILY (Patient taking differently: Take 1 tablet by mouth 2 (two) times daily. ) 180 tablet 3  . carvedilol (COREG) 25 MG tablet TAKE 1 TABLET BY MOUTH TWICE DAILY 180 tablet 1  . colchicine 0.6 MG tablet Take 0.6 mg by mouth daily as needed (gout flares).   0  . gabapentin (NEURONTIN) 300 MG capsule Take 1 capsule (300 mg total) by mouth 3 (three) times daily. 12 capsule 0  . levothyroxine (SYNTHROID, LEVOTHROID) 137 MCG tablet Take 1 tablet (137 mcg total) by mouth daily before breakfast. 30 tablet 5  . nitroGLYCERIN (NITROSTAT) 0.4 MG SL tablet Place 0.4 mg under the tongue  every 5 (five) minutes as needed for chest pain.    [name] oxyCODONE (OXY IR/ROXICODONE) 5 MG immediate release tablet Take 1 tablet (5 mg total) by mouth every 6 (six) hours as needed for moderate pain. 20 tablet 0  . pantoprazole (PROTONIX) 40 MG tablet Take 1 tablet (40 mg total) by  mouth daily. 30 tablet 4  . potassium chloride 20 MEQ TBCR Take 20 mEq by mouth daily. 30 tablet 3  . sertraline (ZOLOFT) 50 MG tablet Take 1.5 tablets (75 mg total) by mouth daily. 135 tablet 3  . sodium bicarbonate 650 MG tablet Take 1 tablet (650 mg total) by mouth 2 (two) times daily. (Patient taking differently: Take 650 mg by mouth daily. ) 60 tablet 0  . traMADol (ULTRAM) 50 MG tablet Take 1-2 tablets (50-100 mg total) by mouth every 6 (six) hours as needed for moderate pain or severe pain (mild pain). 20 tablet 0  . torsemide (DEMADEX) 20 MG tablet Take 2 tablets (40 mg total) by mouth every other day alternating with (1 tablet) 20mg  every other day 40 tablet 6   No current facility-administered medications for this encounter.    BP 138/70   Pulse 64   Wt 64 kg (141 lb)   SpO2 95%   BMI 28.48 kg/m   Wt Readings from Last 3 Encounters:  07/01/18 64 kg (141 lb)  06/07/18 63.7 kg (140 lb 6.4 oz)  06/03/18 63.2 kg (139 lb 4.8 oz)   General: NAD Neck: No JVD, no thyromegaly or thyroid nodule.  Lungs: Clear to auscultation bilaterally with normal respiratory effort. CV: Nondisplaced PMI.  Heart regular S1/S2, no S3/S4, no murmur.  No peripheral edema.  No carotid bruit.  Normal pedal pulses.  Abdomen: Soft, nontender, no hepatosplenomegaly, no distention.  Skin: Intact without lesions or rashes.  Neurologic: Alert and oriented x 3.  Psych: Normal affect. Extremities: No clubbing or cyanosis.  HEENT: Normal.   Assessment/Plan: 1. Chronic diastolic CHF: Patient has history of presumed nonischemic cardiomyopathy (Cardiolite with no ischemia, cath not done with CKD).  However, echo in 4/19 showed EF improved  to 60-65% with BiV pacing.  Echo in 10/19 showed stable EF 55-60% with mild-moderate MR, moderate AI.  Even though Optivol suggests volume overload, she is not volume overloaded on exam and REDS clip reading is 35%.  NYHA class II symptoms.  - Keep torsemide at 40 mg daily and will get BMET today.  - Given combination of diastolic CHF and possible neuropathy (balance difficulty), I will arrange for a PYP scan to assess for transthyretin amyloidosis.  I will also get a myeloma panel.   2. Atrial flutter: History of atypical flutter, 3/19 DCCV.  NSR today.  - Off Eliquis due to GI bleeding.  3. CKD: Stage 3, followed by [name].   4. GI bleeding: has had gastric ulcer and gastric AVMs as well as small bowel AVMs.  She is now off Eliquis. Followed by GI.   5. Hypertension: BP controlled.   Followup in 1 month with APP.  Loralie Champagne 07/02/2018

## 2018-07-03 LAB — MULTIPLE MYELOMA PANEL, SERUM
ALPHA2 GLOB SERPL ELPH-MCNC: 0.9 g/dL (ref 0.4–1.0)
Albumin SerPl Elph-Mcnc: 4.2 g/dL (ref 2.9–4.4)
Albumin/Glob SerPl: 1.1 (ref 0.7–1.7)
Alpha 1: 0.2 g/dL (ref 0.0–0.4)
B-Globulin SerPl Elph-Mcnc: 1.2 g/dL (ref 0.7–1.3)
Gamma Glob SerPl Elph-Mcnc: 1.8 g/dL (ref 0.4–1.8)
Globulin, Total: 4.1 g/dL — ABNORMAL HIGH (ref 2.2–3.9)
IGG (IMMUNOGLOBIN G), SERUM: 1894 mg/dL — AB (ref 700–1600)
IGM (IMMUNOGLOBULIN M), SRM: 162 mg/dL (ref 26–217)
IgA: 588 mg/dL — ABNORMAL HIGH (ref 87–352)
TOTAL PROTEIN ELP: 8.3 g/dL (ref 6.0–8.5)

## 2018-07-09 ENCOUNTER — Other Ambulatory Visit (HOSPITAL_COMMUNITY): Payer: Medicare Other

## 2018-07-09 ENCOUNTER — Ambulatory Visit (INDEPENDENT_AMBULATORY_CARE_PROVIDER_SITE_OTHER): Payer: Medicare Other

## 2018-07-09 DIAGNOSIS — Z95810 Presence of automatic (implantable) cardiac defibrillator: Secondary | ICD-10-CM

## 2018-07-09 DIAGNOSIS — I5032 Chronic diastolic (congestive) heart failure: Secondary | ICD-10-CM

## 2018-07-10 ENCOUNTER — Encounter (HOSPITAL_COMMUNITY): Payer: Medicare Other

## 2018-07-10 ENCOUNTER — Other Ambulatory Visit (HOSPITAL_COMMUNITY): Payer: Medicare Other

## 2018-07-10 ENCOUNTER — Telehealth (HOSPITAL_COMMUNITY): Payer: Self-pay

## 2018-07-10 ENCOUNTER — Ambulatory Visit (HOSPITAL_COMMUNITY): Admission: RE | Admit: 2018-07-10 | Payer: Medicare Other | Source: Ambulatory Visit

## 2018-07-10 DIAGNOSIS — N183 Chronic kidney disease, stage 3 (moderate): Secondary | ICD-10-CM | POA: Diagnosis not present

## 2018-07-10 DIAGNOSIS — R809 Proteinuria, unspecified: Secondary | ICD-10-CM | POA: Diagnosis not present

## 2018-07-10 DIAGNOSIS — M109 Gout, unspecified: Secondary | ICD-10-CM | POA: Diagnosis not present

## 2018-07-10 DIAGNOSIS — N179 Acute kidney failure, unspecified: Secondary | ICD-10-CM | POA: Diagnosis not present

## 2018-07-10 DIAGNOSIS — N189 Chronic kidney disease, unspecified: Secondary | ICD-10-CM | POA: Diagnosis not present

## 2018-07-10 DIAGNOSIS — D631 Anemia in chronic kidney disease: Secondary | ICD-10-CM | POA: Diagnosis not present

## 2018-07-10 DIAGNOSIS — I129 Hypertensive chronic kidney disease with stage 1 through stage 4 chronic kidney disease, or unspecified chronic kidney disease: Secondary | ICD-10-CM | POA: Diagnosis not present

## 2018-07-10 NOTE — Telephone Encounter (Signed)
-----  Message from [name], MD sent at 07/03/2018  4:50 PM EST ----- Not consistent with multiple myeloma.

## 2018-07-10 NOTE — Progress Notes (Signed)
EPIC Encounter for ICM Monitoring  Patient Name: Morgan Clay is a 73 y.o. female Date: 07/10/2018 Primary Care Physican: [name], DO Primary Cardiologist:[place] Electrophysiologist: Caryl Comes Nephrologist: [name] Bi-V Pacing:  100% Today's Weight:137 lbs   Heart failure questions reviewed and she is feeling fine.  She said Torsemide dosage was changed on 2/25 by HF clinic and confirmed she is taking as prescribed.   Thoracic impedance abnormal since 05/22/2018 suggesting fluid accumulation but is improving.   Prescribed:Torsemide20 mg Take 2 tablets (40 mg total) by mouth every other day alternating with (1 tablet) 20mg  every other day  Labs: 07/01/2018 Creatinine 2.00, BUN 45, Potassium 4.1, Sodium 138 06/03/2018 Creatinine 1.87, BUN 27, Potassium 3.7, Sodium 136, GFR 32.18  05/23/2018 Creatinine 2.34, BUN 41, Potassium 4.3, Sodium 135  05/20/2018 Creatinine 1.82, BUN 50, Potassium 3.6, Sodium 139  04/18/2018 Creatinine 2.11, BUN 35, Potassium 3.8, Sodium 142  03/19/2018 Creatinine2.30, BUN28, Potassium3.9, Sodium141 03/06/2018 Creatinine2.55, BUN30, Potassium2.9, Sodium136, [id number]  03/05/2018 Creatinine3.52, BUN31, Potassium4.1, Sodium130, [id number]  03/04/2018 Creatinine3.64, BUN31, Potassium3.7, Sodium132, [id number]  03/03/2018 Creatinine3.16, BUN33, Potassium3.3, Sodium136, [id number]  03/02/2018 Creatinine2.50, BUN31, Potassium3.6, Sodium141, [id number]  03/01/2018 Creatinine2.15, BUN34, Potassium3.6, [id number], [id number]  02/28/2018 Creatinine2.09, BUN35, Potassium3.5, Sodium140, [id number]  02/27/2018 Creatinine2.26, BUN38, Potassium5.9, [id number], [id number] A complete set of results can be found in Results Review.  Recommendations:No changes and advised to call if experiencing any fluid symptoms. Torsemide dosage was changed on 07/02/2018.  Follow-up plan: ICM clinic phone appointment on3/16/2020 to  recheck fluid levels.   Copy of ICM check sent to Dr.Klein and Dr [name].   3 month ICM trend: 07/09/2018    1 Year ICM trend:       [name], RN 07/10/2018 2:30 PM

## 2018-07-10 NOTE — Telephone Encounter (Signed)
Relayed message to pt, pt unaware of mult myeloma panel being done but was agreeable. Confirmed follow up lab work appt on 11 March

## 2018-07-16 ENCOUNTER — Encounter (HOSPITAL_COMMUNITY)
Admission: RE | Admit: 2018-07-16 | Discharge: 2018-07-16 | Disposition: A | Discharge status: Home / Self Care | Payer: Medicare Other | Source: Ambulatory Visit | Attending: Cardiology | Admitting: Cardiology

## 2018-07-16 DIAGNOSIS — I5032 Chronic diastolic (congestive) heart failure: Secondary | ICD-10-CM | POA: Diagnosis not present

## 2018-07-16 DIAGNOSIS — I509 Heart failure, unspecified: Secondary | ICD-10-CM | POA: Diagnosis not present

## 2018-07-16 MED ORDER — TECHNETIUM TC 99M PYROPHOSPHATE
20.0000 | Freq: Once | INTRAVENOUS | Status: AC | PRN
  Administered 2018-07-16: 20 via INTRAVENOUS

## 2018-07-17 ENCOUNTER — Other Ambulatory Visit (HOSPITAL_COMMUNITY): Payer: Medicare Other

## 2018-07-19 ENCOUNTER — Other Ambulatory Visit: Payer: Self-pay

## 2018-07-19 ENCOUNTER — Ambulatory Visit (HOSPITAL_COMMUNITY)
Admission: RE | Admit: 2018-07-19 | Discharge: 2018-07-19 | Disposition: A | Discharge status: Home / Self Care | Payer: Medicare Other | Source: Ambulatory Visit | Attending: Cardiology | Admitting: Cardiology

## 2018-07-19 DIAGNOSIS — I5032 Chronic diastolic (congestive) heart failure: Secondary | ICD-10-CM | POA: Diagnosis not present

## 2018-07-19 LAB — BASIC METABOLIC PANEL
Anion gap: 11 (ref 5–15)
BUN: 37 mg/dL — ABNORMAL HIGH (ref 8–23)
CO2: 21 mmol/L — AB (ref 22–32)
Calcium: 9.7 mg/dL (ref 8.9–10.3)
Chloride: 106 mmol/L (ref 98–111)
Creatinine, Ser: 1.8 mg/dL — ABNORMAL HIGH (ref 0.44–1.00)
GFR calc Af Amer: 32 mL/min — ABNORMAL LOW (ref >60)
GFR calc non Af Amer: 28 mL/min — ABNORMAL LOW (ref >60)
GLUCOSE: 153 mg/dL — AB (ref 70–99)
Potassium: 4.5 mmol/L (ref 3.5–5.1)
Sodium: 138 mmol/L (ref 135–145)

## 2018-07-21 ENCOUNTER — Other Ambulatory Visit: Payer: Self-pay | Admitting: Internal Medicine

## 2018-07-22 ENCOUNTER — Ambulatory Visit (INDEPENDENT_AMBULATORY_CARE_PROVIDER_SITE_OTHER): Payer: Medicare Other

## 2018-07-22 DIAGNOSIS — I5032 Chronic diastolic (congestive) heart failure: Secondary | ICD-10-CM

## 2018-07-22 DIAGNOSIS — K7469 Other cirrhosis of liver: Secondary | ICD-10-CM | POA: Diagnosis not present

## 2018-07-22 DIAGNOSIS — Z95810 Presence of automatic (implantable) cardiac defibrillator: Secondary | ICD-10-CM

## 2018-07-23 NOTE — Progress Notes (Signed)
EPIC Encounter for ICM Monitoring  Patient Name: Morgan Clay is a 73 y.o. female Date: 07/23/2018 Primary Care Physican: [name], DO Primary [place] Electrophysiologist: Caryl Comes Nephrologist: [name] Bi-V Pacing:  100% Today's Weight:137 lbs   Heart failure questions reviewed and pt is asymptomatic.   Thoracic impedancereturned to normal.   Prescribed:Torsemide20 mg Take 2 tablets (40 mg total) by mouth every other day alternating with (1 tablet) 20mg  every other day  Labs: 07/19/2018 Creatinine 1.80, BUN 37, Potassium 4.5, Sodium 138, GFR 28-32 07/01/2018 Creatinine 2.00, BUN 45, Potassium 4.1, Sodium 138 06/03/2018 Creatinine 1.87, BUN 27, Potassium 3.7, Sodium 136, GFR 32.18  05/23/2018 Creatinine 2.34, BUN 41, Potassium 4.3, Sodium 135  05/20/2018 Creatinine 1.82, BUN 50, Potassium 3.6, Sodium 139  04/18/2018 Creatinine 2.11, BUN 35, Potassium 3.8, Sodium 142  03/19/2018 Creatinine2.30, BUN28, Potassium3.9, Sodium141 03/06/2018 Creatinine2.55, BUN30, Potassium2.9, Sodium136, [id number]  03/05/2018 Creatinine3.52, BUN31, Potassium4.1, Sodium130, [id number]  03/04/2018 Creatinine3.64, BUN31, Potassium3.7, Sodium132, [id number]  03/03/2018 Creatinine3.16, BUN33, Potassium3.3, Sodium136, [id number]  03/02/2018 Creatinine2.50, BUN31, Potassium3.6, Sodium141, [id number]  03/01/2018 Creatinine2.15, BUN34, Potassium3.6, [id number], [id number]  02/28/2018 Creatinine2.09, BUN35, Potassium3.5, Sodium140, [id number]  02/27/2018 Creatinine2.26, BUN38, Potassium5.9, [id number], [id number] A complete set of results can be found in Results Review.  Recommendations:No changes and advised to call if experiencing any fluid symptoms. Torsemide dosage was changed on 07/02/2018.  Follow-up plan: ICM clinic phone appointment on4/13/2020.Office appointment 08/02/2018 with HF NP/PA.  Copy of ICM check sent to  [place].  3 month ICM trend: 07/22/2018    1 Year ICM trend:       [name], RN 07/23/2018 12:33 PM

## 2018-07-24 ENCOUNTER — Other Ambulatory Visit: Payer: Self-pay

## 2018-07-24 ENCOUNTER — Other Ambulatory Visit: Payer: Self-pay | Admitting: Nurse Practitioner

## 2018-07-24 ENCOUNTER — Telehealth (HOSPITAL_COMMUNITY): Payer: Self-pay | Admitting: Cardiology

## 2018-07-24 ENCOUNTER — Other Ambulatory Visit (INDEPENDENT_AMBULATORY_CARE_PROVIDER_SITE_OTHER): Payer: Medicare Other

## 2018-07-24 DIAGNOSIS — E039 Hypothyroidism, unspecified: Secondary | ICD-10-CM

## 2018-07-24 DIAGNOSIS — K7469 Other cirrhosis of liver: Secondary | ICD-10-CM

## 2018-07-24 LAB — TSH: TSH: 1.55 u[IU]/mL (ref 0.35–4.50)

## 2018-07-24 MED ORDER — ISOSORB DINITRATE-HYDRALAZINE 20-37.5 MG PO TABS: 1.0000 | ORAL_TABLET | Freq: Two times a day (BID) | ORAL | 2 refills | Status: DC

## 2018-07-24 NOTE — Telephone Encounter (Signed)
Triage Appointment Phone Calls  NOTE : At this time, do not cancel POST HOSP Appointments.   Introduction: We are calling today to discuss your upcoming appointment. As you know we are dealing with the Coronavirus COVID-19 and want to take all precautions necessary to protect our patients and staff.   Questions 1. Have you or any one in your house had a fever in the past 2 weeks?  No  a. If YES -> Have you had increased SOB, cough, or recent travel to a "high risk area" -> If NO -> "Self-quarantine at home" -> If YES -> Advise them to contact their PCP for further instructions, if they don't have PCP, then discuss with HF provider, Darnelle Bos, or Heather  -> NOTE: Patient can NOT go to a COVID-19 testing site without a physician's order 2. How are you feeling/doing?  Patient reports she is doing well Weight stable at 138 Denies SOB, CP, or edema Reports when she begins a HF flare she will experience increased SOB and fatigue and denies these symptoms at this time a. Is your weight stable? BP stable if checking at home? Have they needed extra diuretics? Is their breathing at baseline?  -> If symptomatic, KEEP appt, and remind only ONE person may accompany them, and no children.   -> If asymptomatic, #3. 3. Do you have enough of your cardiac medications to last you for the next 2 months, or do you need refills? Yes  Refill needed on bidil  4. Do you have enough food and supplies to last for at least 2 weeks? yes   -> If NO -> Refer to [facility] 5. Reschedule their appointment with the following recommendations:  Appointment rescheduled for 5/28 Patient aware of instructions below               -> We will remain open, so please call right away if they are developing any worsening symptoms   -> Their appointment may be out 2 months, but depending on the course of the virus, we may open up appointments sooner   -> Encouraged them to sign up for my-chart and let them know we are looking  at the possibility of virtual or telephone appointments

## 2018-07-26 DIAGNOSIS — H43393 Other vitreous opacities, bilateral: Secondary | ICD-10-CM | POA: Diagnosis not present

## 2018-07-26 DIAGNOSIS — H40033 Anatomical narrow angle, bilateral: Secondary | ICD-10-CM | POA: Diagnosis not present

## 2018-07-29 ENCOUNTER — Telehealth (HOSPITAL_COMMUNITY): Payer: Self-pay

## 2018-07-29 NOTE — Telephone Encounter (Signed)
Pt aware of results of Cardiac Scan.  Pt appreciative.  Pt reports has an appt this week.  Pt advised office visits will be cancelled due to [place].  She expressed interest in telehealth visit.  Message forwarded to scheduler Arbutus Leas to arrange. Pt appreciative.

## 2018-07-29 NOTE — Telephone Encounter (Signed)
-----   Message from [name], MD sent at 07/16/2018  3:36 PM EDT ----- Unlikely to be TTR amyloidosis.

## 2018-07-30 ENCOUNTER — Other Ambulatory Visit: Payer: Self-pay | Admitting: Family Medicine

## 2018-07-31 ENCOUNTER — Encounter (HOSPITAL_COMMUNITY): Payer: Self-pay

## 2018-07-31 ENCOUNTER — Other Ambulatory Visit: Payer: Self-pay

## 2018-07-31 ENCOUNTER — Ambulatory Visit (HOSPITAL_COMMUNITY)
Admission: RE | Admit: 2018-07-31 | Discharge: 2018-07-31 | Disposition: A | Discharge status: Home / Self Care | Payer: Medicare Other | Source: Ambulatory Visit | Attending: Cardiology | Admitting: Cardiology

## 2018-07-31 ENCOUNTER — Telehealth (HOSPITAL_COMMUNITY): Payer: Self-pay

## 2018-07-31 VITALS — Wt 138.0 lb

## 2018-07-31 DIAGNOSIS — I428 Other cardiomyopathies: Secondary | ICD-10-CM

## 2018-07-31 DIAGNOSIS — I5032 Chronic diastolic (congestive) heart failure: Secondary | ICD-10-CM | POA: Diagnosis not present

## 2018-07-31 NOTE — Progress Notes (Signed)
Heart Failure TeleHealth Note  Due to national recommendations of social distancing due to [place] 19, Audio/video telehealth visit is felt to be most appropriate for this patient at this time.  See MyChart message from today for patient consent regarding telehealth for [facility].  Date:  07/31/2018   ID:  Morgan Clay, DOB [birth year], MRN [id number]  Location: Home  Provider location: [address] [place] Type of Visit: Established patient  PCP:  Morgan Kern, DO  Cardiologist:  Morgan Axe, MD Primary HF: Morgan Clay   Chief Complaint: Morgan Clay   History of Present Illness: Morgan Clay is a 73 y.o. female who presents via audio/video conferencing for a telehealth visit today.     Morgan Clay is a 73 year old with a history of nonischemic cardiomyopathy with recovery of EF with BiV pacing, recurrent GI bleeding, diverticulitis with sigmoid colectomy 05/2018, and paroxysmal atrial flutter.   Today, she denies symptoms of palpitations, chest pain, shortness of breath, orthopnea, PND, lower extremity edema, claudication, dizziness, presyncope, syncope, or bleeding. No BRBPR. Appetite is ok. Weight at home has been stable at 138 pounds.    The patient is tolerating medications without difficulties and is otherwise without complaint today.   She denies symptoms of cough, fevers, chills, or new SOB worrisome for COVID 19.   Past Medical History:  Diagnosis Date  . A-fib ([place])    on Eliquis  . AICD (automatic cardioverter/defibrillator) present 10/06/2015  . Anemia   . ARF (acute renal failure) ([place])   . Arthritis    "qwhere" (01/03/2018)  . Asthma    reports mild asthma since childhood - had COPD on dx list from prior PCP  . Chronic bronchitis ([place])    "get it most years; not this past year though" (01/03/2018)  . Chronic kidney disease    "? stage;  followed by [facility]; Morgan. Lorrene Reid" (01/03/2018)  . Chronic systolic congestive heart failure ([place])    . DEPRESSION 12/17/2009   Annotation: PHQ-9 score = 14 done on 12/17/2009 Qualifier: Diagnosis of  By: Hassell Done FNP, Tori Milks    . Diabetes mellitus without complication (HCC)    DIET CONTROLLED   . Diverticulosis   . Enteritis   . FIBROIDS, UTERUS 03/05/2008  . GI bleeding 01/03/2018  . Glaucoma   . Gout   . Hepatitis C    HEPATITIS C - s/p treatment with Harvoni, saw hepatology, Dawn Drazek  . History of blood transfusion ~ 11/2017  . Hyperlipemia 12/06/2012  . HYPERTENSION, BENIGN 04/24/2007  . Hypothyroidism   . LBBB (left bundle branch block)   . Lower GI bleeding    "been dealing w/it since 07/2017" (01/03/2018)  . Nonischemic cardiomyopathy ([place])   . OBESITY 05/27/2009  . OBSTRUCTIVE SLEEP APNEA 11/14/2007   no CPAP  . OSTEOPENIA 09/30/2008  . Pain and swelling of left upper extremity 08/08/2017  . Personal history of other infectious and parasitic disease    Hepatitis B  . Pneumonia    "several times" (01/03/2018)  . Pulmonary edema, acute ([place]) 08/09/2017   Past Surgical History:  Procedure Laterality Date  . APPENDECTOMY    . CARDIAC CATHETERIZATION    . CARDIOVERSION N/A 12/14/2014   Procedure: CARDIOVERSION;  Surgeon: Dorothy Spark, MD;  Location: [facility] ENDOSCOPY;  Service: Cardiovascular;  Laterality: N/A;  . CARDIOVERSION N/A 02/26/2017   Procedure: CARDIOVERSION;  Surgeon: Evans Lance, MD;  Location: [place] CV LAB;  Service: Cardiovascular;  Laterality: N/A;  . CARDIOVERSION N/A 07/24/2017   Procedure: CARDIOVERSION;  Surgeon: Thayer Headings, MD;  Location[facility] ENDOSCOPY;  Service: Cardiovascular;  Laterality: N/A;  . COLONOSCOPY N/A 11/08/2017   Procedure: COLONOSCOPY;  Surgeon: Milus Banister, MD;  Location: [facility] ENDOSCOPY;  Service: Endoscopy;  Laterality: N/A;  . DILATION AND CURETTAGE OF UTERUS    . EP IMPLANTABLE DEVICE N/A 10/06/2015   Procedure: BIV ICD Generator Changeout;  Surgeon: [name], MD;  Location: [place] CV LAB;  Service:  Cardiovascular;  Laterality: N/A;  . ESOPHAGOGASTRODUODENOSCOPY N/A 10/26/2017   Procedure: ESOPHAGOGASTRODUODENOSCOPY (EGD);  Surgeon: Ladene Artist, MD;  Location: [facility] ENDOSCOPY;  Service: Endoscopy;  Laterality: N/A;  . ESOPHAGOGASTRODUODENOSCOPY (EGD) WITH PROPOFOL N/A 11/07/2017   Procedure: ESOPHAGOGASTRODUODENOSCOPY (EGD) WITH PROPOFOL;  Surgeon: Milus Banister, MD;  Location: [facility] ENDOSCOPY;  Service: Endoscopy;  Laterality: N/A;  . HOT HEMOSTASIS N/A 10/26/2017   Procedure: HOT HEMOSTASIS (ARGON PLASMA COAGULATION/BICAP);  Surgeon: Ladene Artist, MD;  Location: [facility] ENDOSCOPY;  Service: Endoscopy;  Laterality: N/A;  . INSERT / REPLACE / REMOVE PACEMAKER  ?2008  . POLYPECTOMY  11/08/2017   Procedure: POLYPECTOMY;  Surgeon: Milus Banister, MD;  Location: [facility] ENDOSCOPY;  Service: Endoscopy;;  . PROCTOSCOPY N/A 05/22/2018   Procedure: RIGID PROCTOSCOPY;  Surgeon: Michael Boston, MD;  Location: [facility] ORS;  Service: General;  Laterality: N/A;  . RIGHT HEART CATH N/A 02/13/2018   Procedure: RIGHT HEART CATH;  Surgeon: [name], MD;  Location: [place] CV LAB;  Service: Cardiovascular;  Laterality: N/A;  . TUBAL LIGATION    . XI ROBOTIC ASSISTED LOWER ANTERIOR RESECTION N/A 05/22/2018   Procedure: XI ROBOTIC ASSISTED SIGMOID COLOECTOMY MOBILIZATION OF SPLENIC FLEXURE, FIREFLY ASSESSMENT OF PERFUSION;  Surgeon: Michael Boston, MD;  Location: [facility] ORS;  Service: General;  Laterality: N/A;  ERAS PATHWAY     Current Outpatient Medications  Medication Sig Dispense Refill  . acetaminophen (TYLENOL) 325 MG tablet Take 2 tablets (650 mg total) by mouth every 6 (six) hours as needed.    [name] albuterol (PROVENTIL HFA;VENTOLIN HFA) 108 (90 Base) MCG/ACT inhaler Inhale 2 puffs into the lungs every 6 (six) hours as needed for wheezing or shortness of breath. 1 Inhaler 3  . allopurinol (ZYLOPRIM) 300 MG tablet Take 300 mg by mouth daily.    . carvedilol (COREG) 25 MG tablet TAKE 1 TABLET BY MOUTH TWICE DAILY  180 tablet 1  . colchicine 0.6 MG tablet Take 0.6 mg by mouth daily as needed (gout flares).   0  . gabapentin (NEURONTIN) 300 MG capsule Take 1 capsule (300 mg total) by mouth 3 (three) times daily. 12 capsule 0  . isosorbide-hydrALAZINE (BIDIL) 20-37.5 MG tablet Take 1 tablet by mouth 2 (two) times daily. 60 tablet 2  . levothyroxine (SYNTHROID, LEVOTHROID) 137 MCG tablet Take 1 tablet (137 mcg total) by mouth daily before breakfast. 30 tablet 5  . oxyCODONE (OXY IR/ROXICODONE) 5 MG immediate release tablet Take 1 tablet (5 mg total) by mouth every 6 (six) hours as needed for moderate pain. 20 tablet 0  . pantoprazole (PROTONIX) 40 MG tablet Take 1 tablet (40 mg total) by mouth daily. 30 tablet 4  . potassium chloride 20 MEQ TBCR Take 20 mEq by mouth daily. 30 tablet 3  . sertraline (ZOLOFT) 50 MG tablet Take 1.5 tablets (75 mg total) by mouth daily. 135 tablet 3  . torsemide (DEMADEX) 20 MG tablet Take 2 tablets (40 mg total) by mouth  every other day alternating with (1 tablet) 20mg  every other day 40 tablet 6  . traMADol (ULTRAM) 50 MG tablet Take 1-2 tablets (50-100 mg total) by mouth every 6 (six) hours as needed for moderate pain or severe pain (mild pain). 20 tablet 0  . nitroGLYCERIN (NITROSTAT) 0.4 MG SL tablet Place 0.4 mg under the tongue every 5 (five) minutes as needed for chest pain.     No current facility-administered medications for this encounter.     Allergies:   Patient has no known allergies.   Social History:  The patient  reports that she has never smoked. She has never used smokeless tobacco. She reports current alcohol use. She reports current drug use. Drug: Marijuana.   Family History:  The patient's family history includes Asthma in her father and sister; Heart attack in her father and mother; Lung cancer in her sister; Stroke in her brother.   ROS:  Please see the history of present illness.   All other systems are personally reviewed and negative.   Exam:  Tele  Health Call; Exam is subjective  General: No resp difficulty. HEENT: Normal Neck: Pt denies tenderness Cor:  Reported regular pulse.  Lungs: Normal respiratory effort with conversation.  Abdomen: Non-distended. Pt denies tenderness with self palpation.  Extremities: Pt denies edema. Neuro: Alert & oriented x 3.   Recent Labs: 07/31/2017: B Natriuretic Peptide 547.9 03/05/2018: ALT 23 05/25/2018: Magnesium 2.1 06/07/2018: Hemoglobin 8.9; Platelet Count 276 07/19/2018: BUN 37; Creatinine, Ser 1.80; Potassium 4.5; Sodium 138 07/24/2018: TSH 1.55  Personally reviewed   Wt Readings from Last 3 Encounters:  07/31/18 62.6 kg (138 lb)  07/01/18 64 kg (141 lb)  06/07/18 63.7 kg (140 lb 6.4 oz)      Other studies personally reviewed: Additional studies/ records that were reviewed today include:  ASSESSMENT AND PLAN:  1.  Chronic Diastolic Heart Failure  NYHA II by report. Weight stable. Continue current diuretic regimen. I reviewed BMET from 07/19/18. Plan to repeat BMET at next [place]. She has refills for HF medicaitons.   COVID screen The patient does not have any symptoms that suggest any further testing/ screening at this time.  Social distancing reinforced today.  Recommended follow-up: Follow up 4 months  Relevant cardiac medications were reviewed at length with the patient today.   The patient does not have concerns regarding their medications at this time.   The following changes were made today: none   Labs/ tests ordered today include: N/A   Patient Risk: After full review of this patients clinical status, I feel that they are at moderate risk for cardiac decompensation at this time.  Today, I have spent 21  minutes with the patient with telehealth technology discussing heart failure and current plan.    Jeanmarie Hubert, NP  07/31/2018 11:24 AM  [facility] [address] Heart and [place] [address] [phone] (office)  [phone] (fax)

## 2018-07-31 NOTE — Telephone Encounter (Signed)
Spoke with pt to schedule 4 month follow up from phone visit per [name], 6/24 @11am . Pt denied any other needs or questions.

## 2018-08-02 ENCOUNTER — Encounter (HOSPITAL_COMMUNITY): Payer: Medicare Other

## 2018-08-05 ENCOUNTER — Ambulatory Visit (INDEPENDENT_AMBULATORY_CARE_PROVIDER_SITE_OTHER): Payer: Medicare Other | Admitting: *Deleted

## 2018-08-05 ENCOUNTER — Other Ambulatory Visit: Payer: Self-pay

## 2018-08-05 DIAGNOSIS — I5032 Chronic diastolic (congestive) heart failure: Secondary | ICD-10-CM | POA: Diagnosis not present

## 2018-08-05 DIAGNOSIS — I428 Other cardiomyopathies: Secondary | ICD-10-CM

## 2018-08-05 LAB — CUP PACEART REMOTE DEVICE CHECK
Battery Remaining Longevity: 53 mo
Battery Voltage: 2.97 V
Brady Statistic AP VP Percent: 19.69 %
Brady Statistic AP VS Percent: 0.01 %
Brady Statistic AS VS Percent: 0.01 %
Brady Statistic RA Percent Paced: 19.68 %
Brady Statistic RV Percent Paced: 99.94 %
HighPow Impedance: 42 Ohm
HighPow Impedance: 66 Ohm
Implantable Lead Implant Date: 20080818
Implantable Lead Implant Date: 20080818
Implantable Lead Implant Date: 20080818
Implantable Lead Location: 753858
Implantable Lead Location: 753860
Implantable Lead Model: 4193
Implantable Lead Model: 5076
Implantable Lead Model: 6947
Implantable Pulse Generator Implant Date: 20170531
Lead Channel Impedance Value: 266 Ohm
Lead Channel Impedance Value: 380 Ohm
Lead Channel Impedance Value: 4047 Ohm
Lead Channel Impedance Value: 4047 Ohm
Lead Channel Impedance Value: 437 Ohm
Lead Channel Pacing Threshold Amplitude: 1 V
Lead Channel Pacing Threshold Amplitude: 1 V
Lead Channel Pacing Threshold Amplitude: 1.125 V
Lead Channel Pacing Threshold Pulse Width: 0.4 ms
Lead Channel Pacing Threshold Pulse Width: 0.4 ms
Lead Channel Sensing Intrinsic Amplitude: 11.75 mV
Lead Channel Sensing Intrinsic Amplitude: 11.75 mV
Lead Channel Sensing Intrinsic Amplitude: 3.25 mV
Lead Channel Sensing Intrinsic Amplitude: 3.25 mV
Lead Channel Setting Pacing Amplitude: 1.75 V
Lead Channel Setting Pacing Amplitude: 2 V
Lead Channel Setting Pacing Amplitude: 2.5 V
Lead Channel Setting Pacing Pulse Width: 0.4 ms
Lead Channel Setting Sensing Sensitivity: 0.3 mV
MDC IDC LEAD LOCATION: 753859
MDC IDC MSMT LEADCHNL LV PACING THRESHOLD PULSEWIDTH: 0.4 ms
MDC IDC MSMT LEADCHNL RV IMPEDANCE VALUE: 323 Ohm
MDC IDC SESS DTM: 20200330052303
MDC IDC SET LEADCHNL LV PACING PULSEWIDTH: 0.4 ms
MDC IDC STAT BRADY AS VP PERCENT: 80.3 %

## 2018-08-12 ENCOUNTER — Other Ambulatory Visit: Payer: Self-pay

## 2018-08-12 ENCOUNTER — Ambulatory Visit (INDEPENDENT_AMBULATORY_CARE_PROVIDER_SITE_OTHER): Payer: Medicare Other | Admitting: Family Medicine

## 2018-08-12 ENCOUNTER — Encounter: Payer: Self-pay | Admitting: Family Medicine

## 2018-08-12 DIAGNOSIS — G4733 Obstructive sleep apnea (adult) (pediatric): Secondary | ICD-10-CM

## 2018-08-12 DIAGNOSIS — E038 Other specified hypothyroidism: Secondary | ICD-10-CM | POA: Diagnosis not present

## 2018-08-12 DIAGNOSIS — I5032 Chronic diastolic (congestive) heart failure: Secondary | ICD-10-CM

## 2018-08-12 DIAGNOSIS — I482 Chronic atrial fibrillation, unspecified: Secondary | ICD-10-CM

## 2018-08-12 DIAGNOSIS — E1122 Type 2 diabetes mellitus with diabetic chronic kidney disease: Secondary | ICD-10-CM

## 2018-08-12 DIAGNOSIS — N184 Chronic kidney disease, stage 4 (severe): Secondary | ICD-10-CM

## 2018-08-12 DIAGNOSIS — M109 Gout, unspecified: Secondary | ICD-10-CM

## 2018-08-12 DIAGNOSIS — I10 Essential (primary) hypertension: Secondary | ICD-10-CM

## 2018-08-12 MED ORDER — TRAZODONE HCL 50 MG PO TABS: 50.0000 mg | ORAL_TABLET | Freq: Every day | ORAL | 1 refills | Status: DC

## 2018-08-12 NOTE — Progress Notes (Signed)
Virtual Visit via Telephone Note  I connected with Morgan Clay on 08/12/18 at  1:00 PM EDT by telephone and verified that I am speaking with the correct person using two identifiers.   I discussed the limitations, risks, security and privacy concerns of performing an evaluation and management service by telephone and the availability of in person appointments. I also discussed with the patient that there may be a patient responsible charge related to this service. The patient expressed understanding and agreed to proceed.  Location patient: home Location provider: home office Participants present for the call: patient, provider Patient did not have a visit in the prior 7 days to address this/these issue(s).   History of Present Illness: Pt contacted in regards to chronic health problems and TOC, previously seen by Dr. [name].   When asked about health problems pt states "you can't look in the computer".  CHF/afib:  Weighs everyday.  Eating everything.  Had cheese and crackers for a snack.  States does not cook with salt. Followed by Cardiology.  Has a pacemaker in place.  Taking coreg, bidil, torsemide.  HTN:  Not checking bp at home, states "those home monitors aren't reliable".  May check her bp at the drug store.  Taking coreg, bidil, torsemide, kdur  OSA: does not wear CPAP.  States made her congested.  Never tried different mask or getting settings adjusted.  Kidney dz:  May drink 1 bottle of water per day.  Seen by nephrology.  Liver dz. : states is scheduled for imaging next month, but is not sure it will happen given issues with COVID-19.  Gout:  Has not had a flare since being in the hospital.    Arthritis:  Problem in hands and thumbs.  Not taking anything for the pain.  DM II:  Pt states she does not have DM.  States "it is only a problem when I'm in the hospital".  Pt is not on meds and does not check fsbs.  Pt states she is eating whatever.  Pt states no one ever mentions  it at [facility].    NKDA.     Observations/Objective: Patient sounds cheerful and well on the phone. I do not appreciate any SOB. Speech and thought processing are grossly intact. Patient reported vitals:  Assessment and Plan: CHF -continue current meds  Coreg 25 mg BID, isosorbide-hydralazine (Bidil) 20-37.5 mg, torsemide alternating 20 mg and 40 mg QOD. -advised to monitor weight daily, decrease sodium intake -continue f/u with Cardiology  OSA -pt advised to consider restarting CPAP.  DM II with CKD stage 4 -diet controlled with complications (renal and neuropathic) -reviewed prior hgb A1C readings with pt -discussed dietary changes -continue f/u with Nephrology, [name] -avoid nephrotoxic meds -will continue to monitor  Hypothyroidism -last TSH 1.55 on 07/24/2018 -continue synthroid 137 mcg daily  Gout -stable -discussed foods to avoid. -pt encouraged to increase po intake of water. -colchicine prn  Afib -not on anticoagulation 2/2 recurrent GIB per chart review -continue coreg 25 mg bid -continue f/u with Cardiology, Dr. Caryl Comes  HTN -encouraged to purchase a bp cuff for home use -discussed lifestyle modifications -continue current medications  Follow Up Instructions: F/u in next 3 months, sooner if needed  I did not refer this patient for an OV in the next 24 hours for this/these issue(s).  I discussed the assessment and treatment plan with the patient. The patient was provided an opportunity to ask questions and all were answered. The patient agreed with the  plan and demonstrated an understanding of the instructions.   The patient was advised to call back or seek an in-person evaluation if the symptoms worsen or if the condition fails to improve as anticipated.  I provided 20 minutes of non-face-to-face time during this encounter.   Billie Ruddy, MD

## 2018-08-14 NOTE — Progress Notes (Signed)
Remote ICD transmission.   

## 2018-08-19 ENCOUNTER — Other Ambulatory Visit: Payer: Self-pay

## 2018-08-19 ENCOUNTER — Ambulatory Visit (INDEPENDENT_AMBULATORY_CARE_PROVIDER_SITE_OTHER): Payer: Medicare Other

## 2018-08-19 DIAGNOSIS — I5032 Chronic diastolic (congestive) heart failure: Secondary | ICD-10-CM | POA: Diagnosis not present

## 2018-08-19 DIAGNOSIS — Z95810 Presence of automatic (implantable) cardiac defibrillator: Secondary | ICD-10-CM

## 2018-08-20 NOTE — Progress Notes (Signed)
EPIC Encounter for ICM Monitoring  Patient Name: Morgan Clay is a 73 y.o. female Date: 08/20/2018 Primary Care Physican: [name], DO Primary [place] Electrophysiologist: Caryl Comes Nephrologist: [name] Bi-V Pacing:99.9% 08/21/2018 Weight:138 lbs   Heart failure questions reviewed and pt is asymptomatic.   Thoracic impedancenormal.  Prescribed:Torsemide20 mgTake 2 tablets (40 mg total) by mouth every other day alternating with (1 tablet) 20mg  every other day  Labs: 07/19/2018 Creatinine 1.80, BUN 37, Potassium 4.5, Sodium 138, GFR 28-32 07/01/2018 Creatinine2.00, BUN45, Potassium4.1, [id number] 06/03/2018 Creatinine1.87, BUN27, Potassium3.7, Sodium136, GFR32.18  05/23/2018 Creatinine2.34, BUN41, Potassium4.3, Sodium135  05/20/2018 Creatinine1.82, BUN50, Potassium3.6, Sodium139  A complete set of results can be found in Results Review.  Recommendations:No changes and advisedto call if experiencing any fluid symptoms.Torsemide dosage was changed on 07/02/2018.  Follow-up plan: ICM clinic phone appointment on5/18/2020.  Copy of ICM check sent to [place].   3 month ICM trend: 08/19/2018    1 Year ICM trend:       Morgan Billings, RN 08/20/2018 10:23 AM

## 2018-08-22 ENCOUNTER — Telehealth: Payer: Self-pay | Admitting: Oncology

## 2018-08-22 NOTE — Telephone Encounter (Signed)
Rescheduled 5/6 appt per sch msg. Called and spoke with patient. Confirmed new date and time

## 2018-09-02 ENCOUNTER — Other Ambulatory Visit: Payer: Self-pay

## 2018-09-02 ENCOUNTER — Ambulatory Visit (INDEPENDENT_AMBULATORY_CARE_PROVIDER_SITE_OTHER): Payer: Medicare Other | Admitting: Family Medicine

## 2018-09-02 ENCOUNTER — Encounter: Payer: Self-pay | Admitting: Family Medicine

## 2018-09-02 DIAGNOSIS — F3341 Major depressive disorder, recurrent, in partial remission: Secondary | ICD-10-CM

## 2018-09-02 DIAGNOSIS — G4700 Insomnia, unspecified: Secondary | ICD-10-CM

## 2018-09-02 DIAGNOSIS — D649 Anemia, unspecified: Secondary | ICD-10-CM

## 2018-09-02 MED ORDER — SERTRALINE HCL 50 MG PO TABS: 50.0000 mg | ORAL_TABLET | Freq: Every day | ORAL | 3 refills | Status: DC

## 2018-09-02 NOTE — Progress Notes (Signed)
Virtual Visit via Video Note  I connected with Morgan Clay  on 09/02/18 at 10:15 AM EDT by a video enabled telemedicine application and verified that I am speaking with the correct person using two identifiers.  Location patient: home Location provider:work or home office Persons participating in the virtual visit: patient, provider  I discussed the limitations of evaluation and management by telemedicine and the availability of in person appointments. The patient expressed understanding and agreed to proceed.   HPI:  Morgan Clay is a very pleasant 73 yo, unfortunately with a very complicated PMH including A. Fib, AICD, Anemia, recurrent GI bleeding in the past, CHF, HTN, CKD, Depression, hypothyroidism,  hyperlipidemia, diabetes and more seen for follow up. She reports she is actually finally doing much better. Since improving from the chronic diverticulitis she reports bleeding has stopped, bowels are goo and energy has returned. She reports with feeling better her mood has improved significantly and she feels she may no longer need the Zoloft. She denies any depression or anxiety. Reports she is able to practice social distancing in light of the [place] 19 pandemic and prefers to avoid in office visit for now. She reports no longer takes oxycodone. Continues to use trazadone at night for sleep She recently transferred her care to Dr. Volanda Napoleon. We did her thyroid check in March and it looked good. She continues to see cardiology, nephrology, surgery (for history of complicated diverticulitis, gastroenterology for GI and liver issues and oncology for anemia. Reports hematology appointment has been rescheduled and is in a few months.2  ROS: See pertinent positives and negatives per HPI.  Past Medical History:  Diagnosis Date  . A-fib ([place])    on Eliquis  . AICD (automatic cardioverter/defibrillator) present 10/06/2015  . Anemia   . ARF (acute renal failure) ([place])   . Arthritis    "qwhere"  (01/03/2018)  . Asthma    reports mild asthma since childhood - had COPD on dx list from prior PCP  . Chronic bronchitis ([place])    "get it most years; not this past year though" (01/03/2018)  . Chronic kidney disease    "? stage;  followed by [facility]; [name]" (01/03/2018)  . Chronic systolic congestive heart failure ([place])   . DEPRESSION 12/17/2009   Annotation: PHQ-9 score = 14 done on 12/17/2009 Qualifier: Diagnosis of  By: Hassell Done FNP, Tori Milks    . Diabetes mellitus without complication (HCC)    DIET CONTROLLED   . Diverticulosis   . Enteritis   . FIBROIDS, UTERUS 03/05/2008  . GI bleeding 01/03/2018  . Glaucoma   . Gout   . Hepatitis C    HEPATITIS C - s/p treatment with Harvoni, saw hepatology, Dawn Drazek  . History of blood transfusion ~ 11/2017  . Hyperlipemia 12/06/2012  . HYPERTENSION, BENIGN 04/24/2007  . Hypothyroidism   . LBBB (left bundle branch block)   . Lower GI bleeding    "been dealing w/it since 07/2017" (01/03/2018)  . Nonischemic cardiomyopathy ([place])   . OBESITY 05/27/2009  . OBSTRUCTIVE SLEEP APNEA 11/14/2007   no CPAP  . OSTEOPENIA 09/30/2008  . Pain and swelling of left upper extremity 08/08/2017  . Personal history of other infectious and parasitic disease    Hepatitis B  . Pneumonia    "several times" (01/03/2018)  . Pulmonary edema, acute ([place]) 08/09/2017    Past Surgical History:  Procedure Laterality Date  . APPENDECTOMY    . CARDIAC CATHETERIZATION    . CARDIOVERSION N/A 12/14/2014  Procedure: CARDIOVERSION;  Surgeon: Dorothy Spark, MD;  Location: [facility] ENDOSCOPY;  Service: Cardiovascular;  Laterality: N/A;  . CARDIOVERSION N/A 02/26/2017   Procedure: CARDIOVERSION;  Surgeon: Evans Lance, MD;  Location: [place] CV LAB;  Service: Cardiovascular;  Laterality: N/A;  . CARDIOVERSION N/A 07/24/2017   Procedure: CARDIOVERSION;  Surgeon: Thayer Headings, MD;  Location: [facility] ENDOSCOPY;  Service: Cardiovascular;  Laterality: N/A;  . COLONOSCOPY  N/A 11/08/2017   Procedure: COLONOSCOPY;  Surgeon: Milus Banister, MD;  Location: [facility] ENDOSCOPY;  Service: Endoscopy;  Laterality: N/A;  . DILATION AND CURETTAGE OF UTERUS    . EP IMPLANTABLE DEVICE N/A 10/06/2015   Procedure: BIV ICD Generator Changeout;  Surgeon: [name], MD;  Location: [place] CV LAB;  Service: Cardiovascular;  Laterality: N/A;  . ESOPHAGOGASTRODUODENOSCOPY N/A 10/26/2017   Procedure: ESOPHAGOGASTRODUODENOSCOPY (EGD);  Surgeon: Ladene Artist, MD;  Location: [facility] ENDOSCOPY;  Service: Endoscopy;  Laterality: N/A;  . ESOPHAGOGASTRODUODENOSCOPY (EGD) WITH PROPOFOL N/A 11/07/2017   Procedure: ESOPHAGOGASTRODUODENOSCOPY (EGD) WITH PROPOFOL;  Surgeon: Milus Banister, MD;  Location: [facility] ENDOSCOPY;  Service: Endoscopy;  Laterality: N/A;  . HOT HEMOSTASIS N/A 10/26/2017   Procedure: HOT HEMOSTASIS (ARGON PLASMA COAGULATION/BICAP);  Surgeon: Ladene Artist, MD;  Location: [facility] ENDOSCOPY;  Service: Endoscopy;  Laterality: N/A;  . INSERT / REPLACE / REMOVE PACEMAKER  ?2008  . POLYPECTOMY  11/08/2017   Procedure: POLYPECTOMY;  Surgeon: Milus Banister, MD;  Location: [facility] ENDOSCOPY;  Service: Endoscopy;;  . PROCTOSCOPY N/A 05/22/2018   Procedure: RIGID PROCTOSCOPY;  Surgeon: Michael Boston, MD;  Location: [facility] ORS;  Service: General;  Laterality: N/A;  . RIGHT HEART CATH N/A 02/13/2018   Procedure: RIGHT HEART CATH;  Surgeon: [name], MD;  Location: [place] CV LAB;  Service: Cardiovascular;  Laterality: N/A;  . TUBAL LIGATION    . XI ROBOTIC ASSISTED LOWER ANTERIOR RESECTION N/A 05/22/2018   Procedure: XI ROBOTIC ASSISTED SIGMOID COLOECTOMY MOBILIZATION OF SPLENIC FLEXURE, FIREFLY ASSESSMENT OF PERFUSION;  Surgeon: Michael Boston, MD;  Location: [facility] ORS;  Service: General;  Laterality: N/A;  ERAS PATHWAY    Family History  Problem Relation Age of Onset  . Asthma Father   . Heart attack Father   . Asthma Sister   . Lung cancer Sister   . Heart attack Mother   . Stroke  Brother   . Colon cancer Neg Hx   . Esophageal cancer Neg Hx   . Pancreatic cancer Neg Hx   . Stomach cancer Neg Hx   . Liver disease Neg Hx     SOCIAL HX: see hpi   Current Outpatient Medications:  .  acetaminophen (TYLENOL) 325 MG tablet, Take 2 tablets (650 mg total) by mouth every 6 (six) hours as needed., Disp: , Rfl:  .  albuterol (PROVENTIL HFA;VENTOLIN HFA) 108 (90 Base) MCG/ACT inhaler, Inhale 2 puffs into the lungs every 6 (six) hours as needed for wheezing or shortness of breath., Disp: 1 Inhaler, Rfl: 3 .  allopurinol (ZYLOPRIM) 300 MG tablet, Take 300 mg by mouth daily., Disp: , Rfl:  .  carvedilol (COREG) 25 MG tablet, TAKE 1 TABLET BY MOUTH TWICE DAILY, Disp: 180 tablet, Rfl: 1 .  colchicine 0.6 MG tablet, Take 0.6 mg by mouth daily as needed (gout flares). , Disp: , Rfl: 0 .  gabapentin (NEURONTIN) 300 MG capsule, Take 1 capsule (300 mg total) by mouth 3 (three) times daily., Disp: 12 capsule, Rfl: 0 .  isosorbide-hydrALAZINE (BIDIL) 20-37.5  MG tablet, Take 1 tablet by mouth 2 (two) times daily., Disp: 60 tablet, Rfl: 2 .  levothyroxine (SYNTHROID, LEVOTHROID) 137 MCG tablet, Take 1 tablet (137 mcg total) by mouth daily before breakfast., Disp: 30 tablet, Rfl: 5 .  nitroGLYCERIN (NITROSTAT) 0.4 MG SL tablet, Place 0.4 mg under the tongue every 5 (five) minutes as needed for chest pain., Disp: , Rfl:  .  pantoprazole (PROTONIX) 40 MG tablet, Take 1 tablet (40 mg total) by mouth daily., Disp: 30 tablet, Rfl: 4 .  potassium chloride 20 MEQ TBCR, Take 20 mEq by mouth daily., Disp: 30 tablet, Rfl: 3 .  sertraline (ZOLOFT) 50 MG tablet, Take 1 tablet (50 mg total) by mouth daily., Disp: 30 tablet, Rfl: 3 .  torsemide (DEMADEX) 20 MG tablet, Take 2 tablets (40 mg total) by mouth every other day alternating with (1 tablet) 20mg  every other day, Disp: 40 tablet, Rfl: 6 .  traMADol (ULTRAM) 50 MG tablet, Take 1-2 tablets (50-100 mg total) by mouth every 6 (six) hours as needed for  moderate pain or severe pain (mild pain)., Disp: 20 tablet, Rfl: 0 .  traZODone (DESYREL) 50 MG tablet, Take 1 tablet (50 mg total) by mouth at bedtime., Disp: 90 tablet, Rfl: 1  EXAM:  VITALS per patient if applicable:  GENERAL: alert, oriented, appears well and in no acute distress  HEENT: atraumatic, conjunttiva clear, no obvious abnormalities on inspection of external nose and ears  NECK: normal movements of the head and neck  LUNGS: on inspection no signs of respiratory distress, breathing rate appears normal, no obvious gross SOB, gasping or wheezing  CV: no obvious cyanosis  MS: moves all visible extremities without noticeable abnormality  PSYCH/NEURO: pleasant and cooperative, no obvious depression or anxiety, speech and thought processing grossly intact  ASSESSMENT AND PLAN:  Discussed the following assessment and plan:  MDD (major depressive disorder), recurrent, in partial remission (HCC)  Anemia, unspecified type  Insomnia, unspecified type  Discussed a taper off of SSRI per her request. I am so thrilled that she is feeling better. It has been a tough few years. We will decrease her dose of to 50mg  and advised follow up with Dr. Volanda Napoleon or me in a 1-2 months to see how she is doing. Advised should do CBC at some point. She reports is scheduled with oncology for this. She wishes to continue trazadone. Updated med list and removed axycodone which she reports she is not taking.   I discussed the assessment and treatment plan with the patient. The patient was provided an opportunity to ask questions and all were answered. The patient agreed with the plan and demonstrated an understanding of the instructions.   The patient was advised to call back or seek an in-person evaluation if the symptoms worsen or if the condition fails to improve as anticipated.   Follow up instructions: Advised assistant Wendie Simmer to help patient arrange the following: -follow up w/ Dr. [name] or  Dr. Volanda Napoleon in 2 months (she has transferred to Dr. Volanda Napoleon, but happy to see her for this if she prefers)  [name], DO

## 2018-09-10 ENCOUNTER — Other Ambulatory Visit: Payer: Medicare Other

## 2018-09-11 ENCOUNTER — Ambulatory Visit: Payer: Medicare Other | Admitting: Oncology

## 2018-09-11 ENCOUNTER — Other Ambulatory Visit: Payer: Medicare Other

## 2018-09-23 ENCOUNTER — Ambulatory Visit (INDEPENDENT_AMBULATORY_CARE_PROVIDER_SITE_OTHER): Payer: Medicare Other

## 2018-09-23 ENCOUNTER — Other Ambulatory Visit: Payer: Medicare Other

## 2018-09-23 ENCOUNTER — Other Ambulatory Visit: Payer: Self-pay

## 2018-09-23 DIAGNOSIS — Z95810 Presence of automatic (implantable) cardiac defibrillator: Secondary | ICD-10-CM

## 2018-09-23 DIAGNOSIS — I5032 Chronic diastolic (congestive) heart failure: Secondary | ICD-10-CM | POA: Diagnosis not present

## 2018-09-26 NOTE — Progress Notes (Signed)
EPIC Encounter for ICM Monitoring  Patient Name: Morgan Clay is a 73 y.o. female Date: 09/26/2018 Primary Care Physican: [name], DO Primary [place] Electrophysiologist: Caryl Comes Nephrologist: [name] Bi-V Pacing:99.9% 08/21/2018 Weight:138 lbs   Transmission reviewed.  Thoracic impedancenormal.  Prescribed:Torsemide20 mgTake 2 tablets (40 mg total) by mouth every other day alternating with (1 tablet) 20mg  every other day  Labs: 07/19/2018 Creatinine 1.80, BUN 37, Potassium 4.5, Sodium 138, GFR 28-32 07/01/2018 Creatinine2.00, BUN45, Potassium4.1, [id number] 06/03/2018 Creatinine1.87, BUN27, Potassium3.7, Sodium136, GFR32.18  05/23/2018 Creatinine2.34, BUN41, Potassium4.3, Sodium135  05/20/2018 Creatinine1.82, BUN50, Potassium3.6, Sodium139  A complete set of results can be found in Results Review.  Recommendations:None  Follow-up plan: ICM clinic phone appointment on6/22/2020.  Copy of ICM check sent to [place].   3 month ICM trend: 09/23/2018    1 Year ICM trend:       [name], RN 09/26/2018 12:41 PM

## 2018-10-02 ENCOUNTER — Encounter (HOSPITAL_COMMUNITY): Payer: Medicare Other

## 2018-10-10 DIAGNOSIS — N183 Chronic kidney disease, stage 3 (moderate): Secondary | ICD-10-CM | POA: Diagnosis not present

## 2018-10-10 DIAGNOSIS — R809 Proteinuria, unspecified: Secondary | ICD-10-CM | POA: Diagnosis not present

## 2018-10-10 DIAGNOSIS — D631 Anemia in chronic kidney disease: Secondary | ICD-10-CM | POA: Diagnosis not present

## 2018-10-10 DIAGNOSIS — N189 Chronic kidney disease, unspecified: Secondary | ICD-10-CM | POA: Diagnosis not present

## 2018-10-10 DIAGNOSIS — M109 Gout, unspecified: Secondary | ICD-10-CM | POA: Diagnosis not present

## 2018-10-10 DIAGNOSIS — I129 Hypertensive chronic kidney disease with stage 1 through stage 4 chronic kidney disease, or unspecified chronic kidney disease: Secondary | ICD-10-CM | POA: Diagnosis not present

## 2018-10-28 ENCOUNTER — Ambulatory Visit (INDEPENDENT_AMBULATORY_CARE_PROVIDER_SITE_OTHER): Payer: Medicare Other

## 2018-10-28 DIAGNOSIS — Z95810 Presence of automatic (implantable) cardiac defibrillator: Secondary | ICD-10-CM | POA: Diagnosis not present

## 2018-10-28 DIAGNOSIS — I5032 Chronic diastolic (congestive) heart failure: Secondary | ICD-10-CM | POA: Diagnosis not present

## 2018-10-30 ENCOUNTER — Encounter (HOSPITAL_COMMUNITY): Payer: Medicare Other

## 2018-11-01 ENCOUNTER — Ambulatory Visit (INDEPENDENT_AMBULATORY_CARE_PROVIDER_SITE_OTHER): Payer: Medicare Other | Admitting: Family Medicine

## 2018-11-01 ENCOUNTER — Encounter: Payer: Self-pay | Admitting: Family Medicine

## 2018-11-01 ENCOUNTER — Other Ambulatory Visit: Payer: Self-pay

## 2018-11-01 DIAGNOSIS — F324 Major depressive disorder, single episode, in partial remission: Secondary | ICD-10-CM | POA: Diagnosis not present

## 2018-11-01 NOTE — Progress Notes (Signed)
EPIC Encounter for ICM Monitoring  Patient Name: Morgan Clay is a 73 y.o. female Date: 11/01/2018 Primary Care Physican: [name], DO Primary Cardiologist:[place] Electrophysiologist: Caryl Comes Nephrologist: [name] Bi-V Pacing:99.9% 11/01/2018 Weight: 138 lbs   Spoke with patient and heart failure questions reviewed.  She is feeling fine at this time and does not have any complaints.  Optivol thoracic impedancenormal.  Prescribed:Torsemide20 mgTake 2 tablets (40 mg total) by mouth every other day alternating with (1 tablet) 20mg  every other day  Labs: 07/19/2018 Creatinine 1.80, BUN 37, Potassium 4.5, Sodium 138, GFR 28-32 07/01/2018 Creatinine2.00, BUN45, Potassium4.1, [id number] 06/03/2018 Creatinine1.87, BUN27, Potassium3.7, Sodium136, GFR32.18  05/23/2018 Creatinine2.34, BUN41, Potassium4.3, Sodium135  05/20/2018 Creatinine1.82, BUN50, Potassium3.6, Sodium139  A complete set of results can be found in Results Review.  Recommendations: No changes and encouraged to call if experiencing any fluid symptoms  Follow-up plan: ICM clinic phone appointment on7/27/2020.  Copy of ICM check sent to [place].  3 month ICM trend: 10/28/2018    1 Year ICM trend:       [name], RN 11/01/2018 8:47 AM

## 2018-11-01 NOTE — Progress Notes (Signed)
Virtual Visit via Video Note  I connected with Morgan Clay on 11/01/18 at  1:00 PM EDT by a video enabled telemedicine application and verified that I am speaking with the correct person using two identifiers.  Location patient: home Location provider:work or home office Persons participating in the virtual visit: patient, provider  I discussed the limitations of evaluation and management by telemedicine and the availability of in person appointments. The patient expressed understanding and agreed to proceed.   HPI: Pt seen for f/u on depression.  Taking Zoloft 50 mg.  States "everything has got better".  Energy is good, sleep is good. States her mood is ok.  States she has been bored which can make her down.  States talks to her family regularly.  States there is nothing for seniors to do in [place].  Had blood work done 2 wks ago with Nephrology, [name].  States everything was normal.  Pt has a h/o anemia and CKD.   ROS: See pertinent positives and negatives per HPI.  Past Medical History:  Diagnosis Date  . A-fib ([place])    on Eliquis  . AICD (automatic cardioverter/defibrillator) present 10/06/2015  . Anemia   . ARF (acute renal failure) ([place])   . Arthritis    "qwhere" (01/03/2018)  . Asthma    reports mild asthma since childhood - had COPD on dx list from prior PCP  . Chronic bronchitis ([place])    "get it most years; not this past year though" (01/03/2018)  . Chronic kidney disease    "? stage;  followed by [facility]; [name]" (01/03/2018)  . Chronic systolic congestive heart failure ([place])   . DEPRESSION 12/17/2009   Annotation: PHQ-9 score = 14 done on 12/17/2009 Qualifier: Diagnosis of  By: Hassell Done FNP, Tori Milks    . Diabetes mellitus without complication (HCC)    DIET CONTROLLED   . Diverticulosis   . Enteritis   . FIBROIDS, UTERUS 03/05/2008  . GI bleeding 01/03/2018  . Glaucoma   . Gout   . Hepatitis C    HEPATITIS C - s/p treatment with Harvoni, saw  hepatology, Dawn Drazek  . History of blood transfusion ~ 11/2017  . Hyperlipemia 12/06/2012  . HYPERTENSION, BENIGN 04/24/2007  . Hypothyroidism   . LBBB (left bundle branch block)   . Lower GI bleeding    "been dealing w/it since 07/2017" (01/03/2018)  . Nonischemic cardiomyopathy ([place])   . OBESITY 05/27/2009  . OBSTRUCTIVE SLEEP APNEA 11/14/2007   no CPAP  . OSTEOPENIA 09/30/2008  . Pain and swelling of left upper extremity 08/08/2017  . Personal history of other infectious and parasitic disease    Hepatitis B  . Pneumonia    "several times" (01/03/2018)  . Pulmonary edema, acute ([place]) 08/09/2017    Past Surgical History:  Procedure Laterality Date  . APPENDECTOMY    . CARDIAC CATHETERIZATION    . CARDIOVERSION N/A 12/14/2014   Procedure: CARDIOVERSION;  Surgeon: Dorothy Spark, MD;  Location[facility] ENDOSCOPY;  Service: Cardiovascular;  Laterality: N/A;  . CARDIOVERSION N/A 02/26/2017   Procedure: CARDIOVERSION;  Surgeon: Evans Lance, MD;  Location: [place] CV LAB;  Service: Cardiovascular;  Laterality: N/A;  . CARDIOVERSION N/A 07/24/2017   Procedure: CARDIOVERSION;  Surgeon: Thayer Headings, MD;  Location: [facility] ENDOSCOPY;  Service: Cardiovascular;  Laterality: N/A;  . COLONOSCOPY N/A 11/08/2017   Procedure: COLONOSCOPY;  Surgeon: Milus Banister, MD;  Location: [facility] ENDOSCOPY;  Service: Endoscopy;  Laterality: N/A;  . DILATION  AND CURETTAGE OF UTERUS    . EP IMPLANTABLE DEVICE N/A 10/06/2015   Procedure: BIV ICD Generator Changeout;  Surgeon: [name], MD;  Location: [place] CV LAB;  Service: Cardiovascular;  Laterality: N/A;  . ESOPHAGOGASTRODUODENOSCOPY N/A 10/26/2017   Procedure: ESOPHAGOGASTRODUODENOSCOPY (EGD);  Surgeon: Ladene Artist, MD;  Location: [facility] ENDOSCOPY;  Service: Endoscopy;  Laterality: N/A;  . ESOPHAGOGASTRODUODENOSCOPY (EGD) WITH PROPOFOL N/A 11/07/2017   Procedure: ESOPHAGOGASTRODUODENOSCOPY (EGD) WITH PROPOFOL;  Surgeon: Milus Banister, MD;  Location:  [facility] ENDOSCOPY;  Service: Endoscopy;  Laterality: N/A;  . HOT HEMOSTASIS N/A 10/26/2017   Procedure: HOT HEMOSTASIS (ARGON PLASMA COAGULATION/BICAP);  Surgeon: Ladene Artist, MD;  Location: [facility] ENDOSCOPY;  Service: Endoscopy;  Laterality: N/A;  . INSERT / REPLACE / REMOVE PACEMAKER  ?2008  . POLYPECTOMY  11/08/2017   Procedure: POLYPECTOMY;  Surgeon: Milus Banister, MD;  Location: [facility] ENDOSCOPY;  Service: Endoscopy;;  . PROCTOSCOPY N/A 05/22/2018   Procedure: RIGID PROCTOSCOPY;  Surgeon: Michael Boston, MD;  Location: [facility] ORS;  Service: General;  Laterality: N/A;  . RIGHT HEART CATH N/A 02/13/2018   Procedure: RIGHT HEART CATH;  Surgeon: [name], MD;  Location: [place] CV LAB;  Service: Cardiovascular;  Laterality: N/A;  . TUBAL LIGATION    . XI ROBOTIC ASSISTED LOWER ANTERIOR RESECTION N/A 05/22/2018   Procedure: XI ROBOTIC ASSISTED SIGMOID COLOECTOMY MOBILIZATION OF SPLENIC FLEXURE, FIREFLY ASSESSMENT OF PERFUSION;  Surgeon: Michael Boston, MD;  Location: [facility] ORS;  Service: General;  Laterality: N/A;  ERAS PATHWAY    Family History  Problem Relation Age of Onset  . Asthma Father   . Heart attack Father   . Asthma Sister   . Lung cancer Sister   . Heart attack Mother   . Stroke Brother   . Colon cancer Neg Hx   . Esophageal cancer Neg Hx   . Pancreatic cancer Neg Hx   . Stomach cancer Neg Hx   . Liver disease Neg Hx      Current Outpatient Medications:  .  acetaminophen (TYLENOL) 325 MG tablet, Take 2 tablets (650 mg total) by mouth every 6 (six) hours as needed., Disp: , Rfl:  .  albuterol (PROVENTIL HFA;VENTOLIN HFA) 108 (90 Base) MCG/ACT inhaler, Inhale 2 puffs into the lungs every 6 (six) hours as needed for wheezing or shortness of breath., Disp: 1 Inhaler, Rfl: 3 .  allopurinol (ZYLOPRIM) 300 MG tablet, Take 300 mg by mouth daily., Disp: , Rfl:  .  carvedilol (COREG) 25 MG tablet, TAKE 1 TABLET BY MOUTH TWICE DAILY, Disp: 180 tablet, Rfl: 1 .  colchicine 0.6 MG tablet,  Take 0.6 mg by mouth daily as needed (gout flares). , Disp: , Rfl: 0 .  gabapentin (NEURONTIN) 300 MG capsule, Take 1 capsule (300 mg total) by mouth 3 (three) times daily., Disp: 12 capsule, Rfl: 0 .  isosorbide-hydrALAZINE (BIDIL) 20-37.5 MG tablet, Take 1 tablet by mouth 2 (two) times daily., Disp: 60 tablet, Rfl: 2 .  levothyroxine (SYNTHROID, LEVOTHROID) 137 MCG tablet, Take 1 tablet (137 mcg total) by mouth daily before breakfast., Disp: 30 tablet, Rfl: 5 .  nitroGLYCERIN (NITROSTAT) 0.4 MG SL tablet, Place 0.4 mg under the tongue every 5 (five) minutes as needed for chest pain., Disp: , Rfl:  .  pantoprazole (PROTONIX) 40 MG tablet, Take 1 tablet (40 mg total) by mouth daily., Disp: 30 tablet, Rfl: 4 .  potassium chloride 20 MEQ TBCR, Take 20 mEq by mouth daily., Disp: 30  tablet, Rfl: 3 .  sertraline (ZOLOFT) 50 MG tablet, Take 1 tablet (50 mg total) by mouth daily., Disp: 30 tablet, Rfl: 3 .  torsemide (DEMADEX) 20 MG tablet, Take 2 tablets (40 mg total) by mouth every other day alternating with (1 tablet) 20mg  every other day, Disp: 40 tablet, Rfl: 6 .  traMADol (ULTRAM) 50 MG tablet, Take 1-2 tablets (50-100 mg total) by mouth every 6 (six) hours as needed for moderate pain or severe pain (mild pain)., Disp: 20 tablet, Rfl: 0 .  traZODone (DESYREL) 50 MG tablet, Take 1 tablet (50 mg total) by mouth at bedtime., Disp: 90 tablet, Rfl: 1  EXAM:  VITALS per patient if applicable:  RR between 12-20 bpm  GENERAL: alert, oriented, appears well and in no acute distress  HEENT: atraumatic, conjunctiva clear, no obvious abnormalities on inspection of external nose and ears  NECK: normal movements of the head and neck  LUNGS: on inspection no signs of respiratory distress, breathing rate appears normal, no obvious gross SOB, gasping or wheezing  CV: no obvious cyanosis  MS: moves all visible extremities without noticeable abnormality  PSYCH/NEURO: pleasant and cooperative, no obvious  depression or anxiety, speech and thought processing grossly intact  ASSESSMENT AND PLAN:  Discussed the following assessment and plan:  Depression, major, single episode, in partial remission (HCC) -continue Zoloft 50 mg daily -Continue Trazadone -consider counseling  F/u prn in 1-2 months    I discussed the assessment and treatment plan with the patient. The patient was provided an opportunity to ask questions and all were answered. The patient agreed with the plan and demonstrated an understanding of the instructions.   The patient was advised to call back or seek an in-person evaluation if the symptoms worsen or if the condition fails to improve as anticipated.   Billie Ruddy, MD

## 2018-11-04 ENCOUNTER — Ambulatory Visit (INDEPENDENT_AMBULATORY_CARE_PROVIDER_SITE_OTHER): Payer: Medicare Other | Admitting: *Deleted

## 2018-11-04 DIAGNOSIS — I482 Chronic atrial fibrillation, unspecified: Secondary | ICD-10-CM

## 2018-11-04 DIAGNOSIS — I428 Other cardiomyopathies: Secondary | ICD-10-CM | POA: Diagnosis not present

## 2018-11-05 LAB — CUP PACEART REMOTE DEVICE CHECK
Battery Remaining Longevity: 48 mo
Battery Voltage: 2.97 V
Brady Statistic AP VP Percent: 12.66 %
Brady Statistic AP VS Percent: 0.01 %
Brady Statistic AS VP Percent: 87.31 %
Brady Statistic AS VS Percent: 0.02 %
Brady Statistic RA Percent Paced: 12.63 %
Brady Statistic RV Percent Paced: 99.7 %
Date Time Interrogation Session: 20200629052204
HighPow Impedance: 41 Ohm
HighPow Impedance: 66 Ohm
Implantable Lead Implant Date: 20080818
Implantable Lead Implant Date: 20080818
Implantable Lead Implant Date: 20080818
Implantable Lead Location: 753858
Implantable Lead Location: 753859
Implantable Lead Location: 753860
Implantable Lead Model: 4193
Implantable Lead Model: 5076
Implantable Lead Model: 6947
Implantable Pulse Generator Implant Date: 20170531
Lead Channel Impedance Value: 247 Ohm
Lead Channel Impedance Value: 342 Ohm
Lead Channel Impedance Value: 380 Ohm
Lead Channel Impedance Value: 4047 Ohm
Lead Channel Impedance Value: 4047 Ohm
Lead Channel Impedance Value: 437 Ohm
Lead Channel Pacing Threshold Amplitude: 0.875 V
Lead Channel Pacing Threshold Amplitude: 1 V
Lead Channel Pacing Threshold Amplitude: 1.375 V
Lead Channel Pacing Threshold Pulse Width: 0.4 ms
Lead Channel Pacing Threshold Pulse Width: 0.4 ms
Lead Channel Pacing Threshold Pulse Width: 0.4 ms
Lead Channel Sensing Intrinsic Amplitude: 12.875 mV
Lead Channel Sensing Intrinsic Amplitude: 12.875 mV
Lead Channel Sensing Intrinsic Amplitude: 2.875 mV
Lead Channel Sensing Intrinsic Amplitude: 2.875 mV
Lead Channel Setting Pacing Amplitude: 1.75 V
Lead Channel Setting Pacing Amplitude: 2 V
Lead Channel Setting Pacing Amplitude: 2.5 V
Lead Channel Setting Pacing Pulse Width: 0.4 ms
Lead Channel Setting Pacing Pulse Width: 0.4 ms
Lead Channel Setting Sensing Sensitivity: 0.3 mV

## 2018-11-14 NOTE — Progress Notes (Signed)
Remote ICD transmission.   

## 2018-11-20 ENCOUNTER — Other Ambulatory Visit: Payer: Self-pay | Admitting: Family Medicine

## 2018-11-27 ENCOUNTER — Encounter: Payer: Self-pay | Admitting: Family Medicine

## 2018-11-27 ENCOUNTER — Ambulatory Visit (INDEPENDENT_AMBULATORY_CARE_PROVIDER_SITE_OTHER): Payer: Medicare Other | Admitting: Family Medicine

## 2018-11-27 ENCOUNTER — Other Ambulatory Visit: Payer: Self-pay

## 2018-11-27 VITALS — BP 130/42 | HR 68 | Temp 98.0°F | Wt 148.0 lb

## 2018-11-27 DIAGNOSIS — E038 Other specified hypothyroidism: Secondary | ICD-10-CM

## 2018-11-27 DIAGNOSIS — E1122 Type 2 diabetes mellitus with diabetic chronic kidney disease: Secondary | ICD-10-CM | POA: Diagnosis not present

## 2018-11-27 DIAGNOSIS — G629 Polyneuropathy, unspecified: Secondary | ICD-10-CM | POA: Diagnosis not present

## 2018-11-27 DIAGNOSIS — N184 Chronic kidney disease, stage 4 (severe): Secondary | ICD-10-CM | POA: Diagnosis not present

## 2018-11-27 DIAGNOSIS — E559 Vitamin D deficiency, unspecified: Secondary | ICD-10-CM

## 2018-11-27 MED ORDER — LEVOTHYROXINE SODIUM 137 MCG PO TABS: 137.0000 ug | ORAL_TABLET | Freq: Every day | ORAL | 0 refills | Status: DC

## 2018-11-27 MED ORDER — GABAPENTIN 100 MG PO CAPS: 100.0000 mg | ORAL_CAPSULE | Freq: Three times a day (TID) | ORAL | 0 refills | Status: DC

## 2018-11-27 NOTE — Telephone Encounter (Signed)
Medication Refill - Medication: levothyroxine (SYNTHROID, LEVOTHROID) 137 MCG tablet  Has the patient contacted their pharmacy? Yes.   (Agent: If no, request that the patient contact the pharmacy for the refill.) (Agent: If yes, when and what did the pharmacy advise?)  Preferred Pharmacy (with phone number or street name): [facility], [place] [phone] (Phone) [phone] (Fax)     Agent: Please be advised that RX refills may take up to 3 business days. We ask that you follow-up with your pharmacy.

## 2018-11-27 NOTE — Progress Notes (Signed)
   Subjective:    Patient ID: Morgan Clay, female    DOB: 1945-09-03, 73 y.o.   MRN: [id number]  HPI Here for one week of aching pains, numbness, and weakness in both legs. No recent trauma. No back pain. The pains start at the hips and run down to the feet. Her arms are not involved. She does not take a statin. She has diabetes, and the last A1c she had in January was reasonable at 7.1. No recent changes in medications. She was found to be hypothyroid last winter, but her last TSH in March was within target range. She is followed by [name] for CKD and mild anemia.    Review of Systems  Constitutional: Negative.   Respiratory: Negative.   Cardiovascular: Negative.   Musculoskeletal: Positive for myalgias.  Neurological: Positive for weakness and numbness.       Objective:   Physical Exam Constitutional:      Appearance: Normal appearance. She is not ill-appearing.  Cardiovascular:     Rate and Rhythm: Normal rate and regular rhythm.     Pulses: Normal pulses.     Heart sounds: Normal heart sounds.  Pulmonary:     Effort: Pulmonary effort is normal.     Breath sounds: Normal breath sounds.  Musculoskeletal:     Comments: She is not tender over the spine or the sciatic notches, lumbar spine shows full ROM. She has reduced ROM of both hips due to pain. The legs are not tender or swollen. Sensation to light touch is intact and symmetrical in the legs and feet. Motor strength in the legs is 4 out of 5 and symmetrical.   Neurological:     Mental Status: She is alert.           Assessment & Plan:  She has pain and numbness and weakness in both legs. Possible etiologies include diabetic or other neuropathies, spinal stenosis, and PMR. We will try Gabapentin 100 mg TID. Check labs for A1c, CBC, B12 , and vit D. Check a thyroid panel. She will follow up with Dr. Volanda Napoleon.  Alysia Penna, MD

## 2018-11-28 LAB — CBC WITH DIFFERENTIAL/PLATELET
Basophils Absolute: 0.1 10*3/uL (ref 0.0–0.1)
Basophils Relative: 1.4 % (ref 0.0–3.0)
Eosinophils Absolute: 0.1 10*3/uL (ref 0.0–0.7)
Eosinophils Relative: 2 % (ref 0.0–5.0)
HCT: 32.5 % — ABNORMAL LOW (ref 36.0–46.0)
Hemoglobin: 10.5 g/dL — ABNORMAL LOW (ref 12.0–15.0)
Lymphocytes Relative: 17 % (ref 12.0–46.0)
Lymphs Abs: 1 10*3/uL (ref 0.7–4.0)
MCHC: 32.4 g/dL (ref 30.0–36.0)
MCV: 91.4 fl (ref 78.0–100.0)
Monocytes Absolute: 0.7 10*3/uL (ref 0.1–1.0)
Monocytes Relative: 11.7 % (ref 3.0–12.0)
Neutro Abs: 4.1 10*3/uL (ref 1.4–7.7)
Neutrophils Relative %: 67.9 % (ref 43.0–77.0)
Platelets: 123 10*3/uL — ABNORMAL LOW (ref 150.0–400.0)
RBC: 3.56 Mil/uL — ABNORMAL LOW (ref 3.87–5.11)
RDW: 19.4 % — ABNORMAL HIGH (ref 11.5–15.5)
WBC: 6.1 10*3/uL (ref 4.0–10.5)

## 2018-11-28 LAB — BASIC METABOLIC PANEL
BUN: 35 mg/dL — ABNORMAL HIGH (ref 6–23)
CO2: 25 mEq/L (ref 19–32)
Calcium: 9.7 mg/dL (ref 8.4–10.5)
Chloride: 106 mEq/L (ref 96–112)
Creatinine, Ser: 1.57 mg/dL — ABNORMAL HIGH (ref 0.40–1.20)
GFR: 39.31 mL/min — ABNORMAL LOW (ref >60.00)
Glucose, Bld: 79 mg/dL (ref 70–99)
Potassium: 3.9 mEq/L (ref 3.5–5.1)
Sodium: 139 mEq/L (ref 135–145)

## 2018-11-28 LAB — HEMOGLOBIN A1C: Hgb A1c MFr Bld: 6.1 % (ref 4.6–6.5)

## 2018-11-28 LAB — VITAMIN B12: Vitamin B-12: 447 pg/mL (ref 211–911)

## 2018-11-28 LAB — VITAMIN D 25 HYDROXY (VIT D DEFICIENCY, FRACTURES): VITD: 24.84 ng/mL — ABNORMAL LOW (ref 30.00–100.00)

## 2018-11-28 LAB — T3, FREE: T3, Free: 3.2 pg/mL (ref 2.3–4.2)

## 2018-11-28 LAB — T4, FREE: Free T4: 1 ng/dL (ref 0.60–1.60)

## 2018-11-28 LAB — TSH: TSH: 2.51 u[IU]/mL (ref 0.35–4.50)

## 2018-12-03 ENCOUNTER — Telehealth: Payer: Self-pay

## 2018-12-03 NOTE — Telephone Encounter (Signed)
Spoke with patient to remind of missed remote transmission 

## 2018-12-06 NOTE — Progress Notes (Signed)
No ICM remote transmission received for 12/02/2018 and next ICM transmission scheduled for 12/30/2018.

## 2018-12-09 ENCOUNTER — Other Ambulatory Visit: Payer: Self-pay | Admitting: Family Medicine

## 2018-12-09 DIAGNOSIS — Z1231 Encounter for screening mammogram for malignant neoplasm of breast: Secondary | ICD-10-CM

## 2018-12-12 ENCOUNTER — Other Ambulatory Visit: Payer: Self-pay

## 2018-12-12 ENCOUNTER — Inpatient Hospital Stay (HOSPITAL_BASED_OUTPATIENT_CLINIC_OR_DEPARTMENT_OTHER): Payer: Medicare Other | Admitting: Oncology

## 2018-12-12 ENCOUNTER — Inpatient Hospital Stay: Payer: Medicare Other | Attending: Oncology

## 2018-12-12 VITALS — BP 134/66 | HR 87 | Temp 98.7°F | Resp 17 | Ht 59.0 in | Wt 147.1 lb

## 2018-12-12 DIAGNOSIS — Z79899 Other long term (current) drug therapy: Secondary | ICD-10-CM | POA: Insufficient documentation

## 2018-12-12 DIAGNOSIS — E611 Iron deficiency: Secondary | ICD-10-CM | POA: Insufficient documentation

## 2018-12-12 DIAGNOSIS — D631 Anemia in chronic kidney disease: Secondary | ICD-10-CM | POA: Diagnosis not present

## 2018-12-12 DIAGNOSIS — N189 Chronic kidney disease, unspecified: Secondary | ICD-10-CM

## 2018-12-12 DIAGNOSIS — D649 Anemia, unspecified: Secondary | ICD-10-CM

## 2018-12-12 LAB — CBC WITH DIFFERENTIAL (CANCER CENTER ONLY)
Abs Immature Granulocytes: 0.02 10*3/uL (ref 0.00–0.07)
Basophils Absolute: 0 10*3/uL (ref 0.0–0.1)
Basophils Relative: 0 %
Eosinophils Absolute: 0.1 10*3/uL (ref 0.0–0.5)
Eosinophils Relative: 3 %
HCT: 35.3 % — ABNORMAL LOW (ref 36.0–46.0)
Hemoglobin: 11.4 g/dL — ABNORMAL LOW (ref 12.0–15.0)
Immature Granulocytes: 0 %
Lymphocytes Relative: 25 %
Lymphs Abs: 1.3 10*3/uL (ref 0.7–4.0)
MCH: 29.3 pg (ref 26.0–34.0)
MCHC: 32.3 g/dL (ref 30.0–36.0)
MCV: 90.7 fL (ref 80.0–100.0)
Monocytes Absolute: 0.6 10*3/uL (ref 0.1–1.0)
Monocytes Relative: 11 %
Neutro Abs: 3 10*3/uL (ref 1.7–7.7)
Neutrophils Relative %: 61 %
Platelet Count: 155 10*3/uL (ref 150–400)
RBC: 3.89 MIL/uL (ref 3.87–5.11)
RDW: 18.2 % — ABNORMAL HIGH (ref 11.5–15.5)
WBC Count: 5 10*3/uL (ref 4.0–10.5)
nRBC: 0 % (ref 0.0–0.2)

## 2018-12-12 LAB — IRON AND TIBC
Iron: 46 ug/dL (ref 41–142)
Saturation Ratios: 12 % — ABNORMAL LOW (ref 21–57)
TIBC: 374 ug/dL (ref 236–444)
UIBC: 328 ug/dL (ref 120–384)

## 2018-12-12 LAB — FERRITIN: Ferritin: 83 ng/mL (ref 11–307)

## 2018-12-12 NOTE — Progress Notes (Signed)
Hematology and Oncology Follow Up Visit  Morgan Clay [id number] [birth year] 73 y.o. 12/12/2018 12:01 PM Morgan Clay, MDKim, Nickola Major, DO   Principle Diagnosis: 73 year old woman with multifactorial anemia diagnosed in July 2019.  She was found to have anemia related to renal insufficiency as well as iron deficiency.   Prior Therapy: Feraheme infusion intermittently as well as packed red cell transfusion.  Current therapy: Supportive management and use growth factor support as needed.  Interim History: Morgan Clay is here for a follow-up visit.  Since the last visit, she reports feeling well without any recent complaints.  She remains active and continues to attend activities of daily living.  She does report periodic weakness but no chest pain or difficulty breathing.  She denies any hematochezia or melena.  She denies any recent hospitalization or illnesses.  She denied headaches, blurry vision, syncope or seizures.  Denies any fevers, chills or sweats.  Denied chest pain, palpitation, orthopnea or leg edema.  Denied cough, wheezing or hemoptysis.  Denied nausea, vomiting or abdominal pain.  Denies any constipation or diarrhea.  Denies any frequency urgency or hesitancy.  Denies any arthralgias or myalgias.  Denies any skin rashes or lesions.  Denies any bleeding or clotting tendency.  Denies any easy bruising.  Denies any hair or nail changes.  Denies any anxiety or depression.  Remaining review of system is negative.        Medications: Updated today on review. Current Outpatient Medications  Medication Sig Dispense Refill  . acetaminophen (TYLENOL) 325 MG tablet Take 2 tablets (650 mg total) by mouth every 6 (six) hours as needed.    [name] albuterol (PROVENTIL HFA;VENTOLIN HFA) 108 (90 Base) MCG/ACT inhaler Inhale 2 puffs into the lungs every 6 (six) hours as needed for wheezing or shortness of breath. 1 Inhaler 3  . allopurinol (ZYLOPRIM) 300 MG tablet Take 300 mg by mouth daily.     . carvedilol (COREG) 25 MG tablet TAKE 1 TABLET BY MOUTH TWICE DAILY 180 tablet 1  . colchicine 0.6 MG tablet Take 0.6 mg by mouth daily as needed (gout flares).   0  . gabapentin (NEURONTIN) 100 MG capsule Take 1 capsule (100 mg total) by mouth 3 (three) times daily. 90 capsule 0  . gabapentin (NEURONTIN) 300 MG capsule Take 1 capsule (300 mg total) by mouth 3 (three) times daily. 12 capsule 0  . isosorbide-hydrALAZINE (BIDIL) 20-37.5 MG tablet Take 1 tablet by mouth 2 (two) times daily. 60 tablet 2  . levothyroxine (SYNTHROID) 137 MCG tablet Take 1 tablet (137 mcg total) by mouth daily before breakfast. 30 tablet 0  . nitroGLYCERIN (NITROSTAT) 0.4 MG SL tablet Place 0.4 mg under the tongue every 5 (five) minutes as needed for chest pain.    . pantoprazole (PROTONIX) 40 MG tablet Take 1 tablet (40 mg total) by mouth daily. 30 tablet 4  . potassium chloride 20 MEQ TBCR Take 20 mEq by mouth daily. 30 tablet 3  . sertraline (ZOLOFT) 50 MG tablet Take 1 tablet (50 mg total) by mouth daily. 30 tablet 3  . torsemide (DEMADEX) 20 MG tablet Take 2 tablets (40 mg total) by mouth every other day alternating with (1 tablet) 20mg  every other day 40 tablet 6  . traMADol (ULTRAM) 50 MG tablet Take 1-2 tablets (50-100 mg total) by mouth every 6 (six) hours as needed for moderate pain or severe pain (mild pain). 20 tablet 0  . traZODone (DESYREL) 50 MG tablet Take 1 tablet (50  mg total) by mouth at bedtime. 90 tablet 1   No current facility-administered medications for this visit.      Allergies: No Known Allergies  Past Medical History, Surgical history, Social history, and Family History unchanged on review today.    Physical Exam:  Blood pressure 134/66, pulse 87, temperature 98.7 F (37.1 C), temperature source Temporal, resp. rate 17, height 4\' 11"  (1.499 m), weight 147 lb 2 oz (66.7 kg), SpO2 100 %.    ECOG: 1    General appearance: Comfortable appearing without any discomfort Head:  Normocephalic without any trauma Oropharynx: Mucous membranes are moist and pink without any thrush or ulcers. Eyes: Pupils are equal and round reactive to light. Lymph nodes: No cervical, supraclavicular, inguinal or axillary lymphadenopathy.   Heart:regular rate and rhythm.  S1 and S2 without leg edema. Lung: Clear without any rhonchi or wheezes.  No dullness to percussion. Abdomin: Soft, nontender, nondistended with good bowel sounds.  No hepatosplenomegaly. Musculoskeletal: No joint deformity or effusion.  Full range of motion noted. Neurological: No deficits noted on motor, sensory and deep tendon reflex exam. Skin: No petechial rash or dryness.  Appeared moist.  Psychiatric: Mood and affect appeared appropriate.    Lab Results: Lab Results  Component Value Date   WBC 5.0 12/12/2018   HGB 11.4 (L) 12/12/2018   HCT 35.3 (L) 12/12/2018   MCV 90.7 12/12/2018   PLT 155 12/12/2018     Chemistry      Component Value Date/Time   NA 139 11/27/2018 1522   NA 138 12/31/2017 0846   K 3.9 11/27/2018 1522   CL 106 11/27/2018 1522   CO2 25 11/27/2018 1522   BUN 35 (H) 11/27/2018 1522   BUN 99 (HH) 12/31/2017 0846   CREATININE 1.57 (H) 11/27/2018 1522   CREATININE 2.36 (H) 11/23/2017 1132   CREATININE 1.26 (H) 02/25/2016 1607   GLU 106 12/20/2016      Component Value Date/Time   CALCIUM 9.7 11/27/2018 1522   ALKPHOS 70 03/05/2018 0555   AST 29 03/05/2018 0555   AST 14 (L) 11/23/2017 1132   ALT 23 03/05/2018 0555   ALT 9 11/23/2017 1132   BILITOT 1.1 03/05/2018 0555   BILITOT 0.3 11/23/2017 1132         Impression and Plan:  73 year old woman with:  1.  Multifactorial anemia with element of chronic renal insufficiency.  Iron studies in January 2020 showed normal levels and currently receiving Aranesp.  Her hemoglobin today is 11.4 and she does not require any growth factor support.  Iron studies are currently pending and that she might require retreatment with  intravenous iron if needed.  Risks and benefits of Feraheme infusion was reiterated and she is agreeable to receive it if needed.  His complications include arthralgias, myalgias and rarely anaphylaxis.  For the time being we will continue active surveillance and repeat laboratory testing in 6 months and intravenous iron as needed.  We will hold off on any Aranesp for the time being.  2.  Follow-up: In 6 months for repeat evaluation.  15  minutes was spent with the patient face-to-face today.  More than 50% of time was spent on reviewing her disease status, reviewing laboratory data, treatment options and complications of therapy.    Zola Button, MD 8/6/202012:01 PM

## 2018-12-16 ENCOUNTER — Ambulatory Visit
Admission: RE | Admit: 2018-12-16 | Discharge: 2018-12-16 | Disposition: A | Discharge status: Home / Self Care | Payer: Medicare Other | Source: Ambulatory Visit | Attending: Nurse Practitioner | Admitting: Nurse Practitioner

## 2018-12-16 ENCOUNTER — Telehealth: Payer: Self-pay | Admitting: *Deleted

## 2018-12-16 DIAGNOSIS — K7469 Other cirrhosis of liver: Secondary | ICD-10-CM

## 2018-12-16 DIAGNOSIS — K7460 Unspecified cirrhosis of liver: Secondary | ICD-10-CM | POA: Diagnosis not present

## 2018-12-16 NOTE — Telephone Encounter (Signed)
Copied from [place] [id number]. Topic: General - Inquiry >> Dec 16, 2018  9:17 AM Scherrie Gerlach wrote: Reason for CRM: pt saw Dr Sarajane Jews 7/22 for hip pain.  Pt states the pain is not better, actually worse.  Pt would like t know what to do next? l Clinic RN spoke with patient and scheduled her an appointment.

## 2018-12-17 ENCOUNTER — Other Ambulatory Visit: Payer: Self-pay

## 2018-12-17 ENCOUNTER — Telehealth (INDEPENDENT_AMBULATORY_CARE_PROVIDER_SITE_OTHER): Payer: Medicare Other | Admitting: Family Medicine

## 2018-12-17 DIAGNOSIS — M25551 Pain in right hip: Secondary | ICD-10-CM | POA: Diagnosis not present

## 2018-12-17 DIAGNOSIS — E559 Vitamin D deficiency, unspecified: Secondary | ICD-10-CM

## 2018-12-17 MED ORDER — VITAMIN D (ERGOCALCIFEROL) 1.25 MG (50000 UNIT) PO CAPS: 50000.0000 [IU] | ORAL_CAPSULE | ORAL | 0 refills | Status: DC

## 2018-12-17 NOTE — Progress Notes (Signed)
Virtual Visit via Video Note  I connected with Morgan Clay on 12/17/18 at  1:30 PM EDT by a video enabled telemedicine application 2/2 [id number] pandemic and verified that I am speaking with the correct person using two identifiers.  Location patient: home Location provider:work or home office Persons participating in the virtual visit: patient, provider  I discussed the limitations of evaluation and management by telemedicine and the availability of in person appointments. The patient expressed understanding and agreed to proceed.   HPI: Pt with hip pain x several weeks.  Pt seen by Dr. Sarajane Jews, started on gabapentin TID, which does not help.  Labs also obtained, vit D was low.  States pain is achy sensation.  States has to sit down when the pain comes on.  Denies loss of bowel or bladder, numbness, tingling in her legs, injury, back pain.  Pt unable to take tylenol, s/p Harvoni treatment for Hep C.  Had RUQ u/s this wk.  Pt also with anemia of CKD, followed by Nephro and Oncology.  Drinking 1 bottle of water per day.  Notes feels tired throughout the day.   ROS: See pertinent positives and negatives per HPI.  Past Medical History:  Diagnosis Date  . A-fib ([place])    on Eliquis  . AICD (automatic cardioverter/defibrillator) present 10/06/2015  . Anemia   . ARF (acute renal failure) ([place])   . Arthritis    "qwhere" (01/03/2018)  . Asthma    reports mild asthma since childhood - had COPD on dx list from prior PCP  . Chronic bronchitis ([place])    "get it most years; not this past year though" (01/03/2018)  . Chronic kidney disease    "? stage;  followed by [facility]; [name]" (01/03/2018)  . Chronic systolic congestive heart failure ([place])   . DEPRESSION 12/17/2009   Annotation: PHQ-9 score = 14 done on 12/17/2009 Qualifier: Diagnosis of  By: Hassell Done FNP, Tori Milks    . Diabetes mellitus without complication (HCC)    DIET CONTROLLED   . Diverticulosis   . Enteritis   . FIBROIDS,  UTERUS 03/05/2008  . GI bleeding 01/03/2018  . Glaucoma   . Gout   . Hepatitis C    HEPATITIS C - s/p treatment with Harvoni, saw hepatology, Dawn Drazek  . History of blood transfusion ~ 11/2017  . Hyperlipemia 12/06/2012  . HYPERTENSION, BENIGN 04/24/2007  . Hypothyroidism   . LBBB (left bundle branch block)   . Lower GI bleeding    "been dealing w/it since 07/2017" (01/03/2018)  . Nonischemic cardiomyopathy ([place])   . OBESITY 05/27/2009  . OBSTRUCTIVE SLEEP APNEA 11/14/2007   no CPAP  . OSTEOPENIA 09/30/2008  . Pain and swelling of left upper extremity 08/08/2017  . Personal history of other infectious and parasitic disease    Hepatitis B  . Pneumonia    "several times" (01/03/2018)  . Pulmonary edema, acute ([place]) 08/09/2017    Past Surgical History:  Procedure Laterality Date  . APPENDECTOMY    . CARDIAC CATHETERIZATION    . CARDIOVERSION N/A 12/14/2014   Procedure: CARDIOVERSION;  Surgeon: Dorothy Spark, MD;  Location: [facility] ENDOSCOPY;  Service: Cardiovascular;  Laterality: N/A;  . CARDIOVERSION N/A 02/26/2017   Procedure: CARDIOVERSION;  Surgeon: Evans Lance, MD;  Location: [facility];  Service: Cardiovascular;  Laterality: N/A;  . CARDIOVERSION N/A 07/24/2017   Procedure: CARDIOVERSION;  Surgeon: Thayer Headings, MD;  Location: [facility];  Service: Cardiovascular;  Laterality: N/A;  .  COLONOSCOPY N/A 11/08/2017   Procedure: COLONOSCOPY;  Surgeon: Milus Banister, MD;  Location: [facility] ENDOSCOPY;  Service: Endoscopy;  Laterality: N/A;  . DILATION AND CURETTAGE OF UTERUS    . EP IMPLANTABLE DEVICE N/A 10/06/2015   Procedure: BIV ICD Generator Changeout;  Surgeon: [name], MD;  Location: [facility];  Service: Cardiovascular;  Laterality: N/A;  . ESOPHAGOGASTRODUODENOSCOPY N/A 10/26/2017   Procedure: ESOPHAGOGASTRODUODENOSCOPY (EGD);  Surgeon: Ladene Artist, MD;  Location: [facility];  Service: Endoscopy;  Laterality: N/A;  . ESOPHAGOGASTRODUODENOSCOPY  (EGD) WITH PROPOFOL N/A 11/07/2017   Procedure: ESOPHAGOGASTRODUODENOSCOPY (EGD) WITH PROPOFOL;  Surgeon: Milus Banister, MD;  Location: [facility]OS[facility];  Service: Endoscopy;  Laterality: N/A;  . HOT HEMOSTASIS N/A 10/26/2017   Procedure: HOT HEMOSTASIS (ARGON PLASMA COAGULATION/BICAP);  Surgeon: Ladene Artist, MD;  Location: [facility];  Service: Endoscopy;  Laterality: N/A;  . INSERT / REPLACE / REMOVE PACEMAKER  ?2008  . POLYPECTOMY  11/08/2017   Procedure: POLYPECTOMY;  Surgeon: Milus Banister, MD;  Location: [facility];  Service: Endoscopy;;  . PROCTOSCOPY N/A 05/22/2018   Procedure: RIGID PROCTOSCOPY;  Surgeon: Michael Boston, MD;  Location: [facility];  Service: General;  Laterality: N/A;  . RIGHT HEART CATH N/A 02/13/2018   Procedure: RIGHT HEART CATH;  Surgeon: [name], MD;  Location: [place] [facility];  Service: Cardiovascular;  Laterality: N/A;  . TUBAL LIGATION    . XI ROBOTIC ASSISTED LOWER ANTERIOR RESECTION N/A 05/22/2018   Procedure: XI ROBOTIC ASSISTED SIGMOID COLOECTOMY MOBILIZATION OF SPLENIC FLEXURE, FIREFLY ASSESSMENT OF PERFUSION;  Surgeon: Michael Boston, MD;  Location: [facility];  Service: General;  Laterality: N/A;  ERAS PATHWAY    Family History  Problem Relation Age of Onset  . Asthma Father   . Heart attack Father   . Asthma Sister   . Lung cancer Sister   . Heart attack Mother   . Stroke Brother   . Colon cancer Neg Hx   . Esophageal cancer Neg Hx   . Pancreatic cancer Neg Hx   . Stomach cancer Neg Hx   . Liver disease Neg Hx     Current Outpatient Medications:  .  acetaminophen (TYLENOL) 325 MG tablet, Take 2 tablets (650 mg total) by mouth every 6 (six) hours as needed., Disp: , Rfl:  .  albuterol (PROVENTIL HFA;VENTOLIN HFA) 108 (90 Base) MCG/ACT inhaler, Inhale 2 puffs into the lungs every 6 (six) hours as needed for wheezing or shortness of breath., Disp: 1 Inhaler, Rfl: 3 .  allopurinol (ZYLOPRIM) 300 MG tablet, Take 300 mg by mouth daily., Disp:  , Rfl:  .  carvedilol (COREG) 25 MG tablet, TAKE 1 TABLET BY MOUTH TWICE DAILY, Disp: 180 tablet, Rfl: 1 .  colchicine 0.6 MG tablet, Take 0.6 mg by mouth daily as needed (gout flares). , Disp: , Rfl: 0 .  gabapentin (NEURONTIN) 100 MG capsule, Take 1 capsule (100 mg total) by mouth 3 (three) times daily., Disp: 90 capsule, Rfl: 0 .  gabapentin (NEURONTIN) 300 MG capsule, Take 1 capsule (300 mg total) by mouth 3 (three) times daily., Disp: 12 capsule, Rfl: 0 .  isosorbide-hydrALAZINE (BIDIL) 20-37.5 MG tablet, Take 1 tablet by mouth 2 (two) times daily., Disp: 60 tablet, Rfl: 2 .  levothyroxine (SYNTHROID) 137 MCG tablet, Take 1 tablet (137 mcg total) by mouth daily before breakfast., Disp: 30 tablet, Rfl: 0 .  nitroGLYCERIN (NITROSTAT) 0.4 MG SL tablet, Place 0.4 mg under the tongue every  5 (five) minutes as needed for chest pain., Disp: , Rfl:  .  pantoprazole (PROTONIX) 40 MG tablet, Take 1 tablet (40 mg total) by mouth daily., Disp: 30 tablet, Rfl: 4 .  potassium chloride 20 MEQ TBCR, Take 20 mEq by mouth daily., Disp: 30 tablet, Rfl: 3 .  sertraline (ZOLOFT) 50 MG tablet, Take 1 tablet (50 mg total) by mouth daily., Disp: 30 tablet, Rfl: 3 .  torsemide (DEMADEX) 20 MG tablet, Take 2 tablets (40 mg total) by mouth every other day alternating with (1 tablet) 20mg  every other day, Disp: 40 tablet, Rfl: 6 .  traMADol (ULTRAM) 50 MG tablet, Take 1-2 tablets (50-100 mg total) by mouth every 6 (six) hours as needed for moderate pain or severe pain (mild pain)., Disp: 20 tablet, Rfl: 0 .  traZODone (DESYREL) 50 MG tablet, Take 1 tablet (50 mg total) by mouth at bedtime., Disp: 90 tablet, Rfl: 1  EXAM:  VITALS per patient if applicable: RR between 39-12 bpm  GENERAL: alert, oriented, appears well and in no acute distress  HEENT: atraumatic, conjunctiva clear, no obvious abnormalities on inspection of external nose and ears  NECK: normal movements of the head and neck  LUNGS: on inspection no  signs of respiratory distress, breathing rate appears normal, no obvious gross SOB, gasping or wheezing  CV: no obvious cyanosis  MS: moves all visible extremities without noticeable abnormality  PSYCH/NEURO: pleasant and cooperative, no obvious depression or anxiety, speech and thought processing grossly intact  ASSESSMENT AND PLAN:  Discussed the following assessment and plan:  Right hip pain  -continued despite gabapentin -discussed possible causes including arthritis, fx, etc -will obtain imaging.  Pt to have xray at [facility] office -will start OTC voltaren gel. -d/c gabpentin -consider Ortho referral based on imaging. - Plan: DG HIP UNILAT WITH PELVIS MIN 4 VIEWS RIGHT  Vitamin D deficiency -25 hydroxy vitamin D level 24.84 on 7/22 - Plan: rx for ergocalciferol weekly sent to pharmacy  F/u prn   I discussed the assessment and treatment plan with the patient. The patient was provided an opportunity to ask questions and all were answered. The patient agreed with the plan and demonstrated an understanding of the instructions.   The patient was advised to call back or seek an in-person evaluation if the symptoms worsen or if the condition fails to improve as anticipated.  I provided 20 minutes of non-face-to-face time during this encounter.   Billie Ruddy, MD

## 2018-12-24 ENCOUNTER — Telehealth: Payer: Self-pay | Admitting: Internal Medicine

## 2018-12-24 NOTE — Telephone Encounter (Signed)
New Message   Patient called and is having trouble sleeping at night. States that anytime she lays pack to go to sleep she starts to feel chest tightness and has to prop herself up on pillows to be able to relieve the chest tightness.  Appointment has been scheduled for a defibrillator check on 12/25/18 at 2:30 pm via recall, but patient wanted to make Dr. Caryl Comes aware of what she is experiencing. Please give patient a call back.

## 2018-12-24 NOTE — Telephone Encounter (Signed)
LVM for return call. 

## 2018-12-25 ENCOUNTER — Ambulatory Visit (INDEPENDENT_AMBULATORY_CARE_PROVIDER_SITE_OTHER): Payer: Medicare Other | Admitting: Student

## 2018-12-25 ENCOUNTER — Encounter: Payer: Self-pay | Admitting: Student

## 2018-12-25 ENCOUNTER — Other Ambulatory Visit (HOSPITAL_COMMUNITY): Payer: Self-pay | Admitting: Internal Medicine

## 2018-12-25 ENCOUNTER — Other Ambulatory Visit: Payer: Self-pay

## 2018-12-25 VITALS — BP 122/78 | HR 67 | Wt 150.0 lb

## 2018-12-25 DIAGNOSIS — I428 Other cardiomyopathies: Secondary | ICD-10-CM | POA: Diagnosis not present

## 2018-12-25 LAB — CUP PACEART INCLINIC DEVICE CHECK
Battery Remaining Longevity: 43 mo
Battery Voltage: 2.96 V
Brady Statistic AP VP Percent: 27.61 %
Brady Statistic AP VS Percent: 0.01 %
Brady Statistic AS VP Percent: 72.3 %
Brady Statistic AS VS Percent: 0.08 %
Brady Statistic RA Percent Paced: 27.45 %
Brady Statistic RV Percent Paced: 99.7 %
Date Time Interrogation Session: 20200819153400
HighPow Impedance: 41 Ohm
HighPow Impedance: 69 Ohm
Implantable Lead Implant Date: 20080818
Implantable Lead Implant Date: 20080818
Implantable Lead Implant Date: 20080818
Implantable Lead Location: 753858
Implantable Lead Location: 753859
Implantable Lead Location: 753860
Implantable Lead Model: 4193
Implantable Lead Model: 5076
Implantable Lead Model: 6947
Implantable Pulse Generator Implant Date: 20170531
Lead Channel Impedance Value: 266 Ohm
Lead Channel Impedance Value: 323 Ohm
Lead Channel Impedance Value: 380 Ohm
Lead Channel Impedance Value: 4047 Ohm
Lead Channel Impedance Value: 4047 Ohm
Lead Channel Impedance Value: 456 Ohm
Lead Channel Pacing Threshold Amplitude: 1 V
Lead Channel Pacing Threshold Amplitude: 1 V
Lead Channel Pacing Threshold Amplitude: 1.25 V
Lead Channel Pacing Threshold Pulse Width: 0.4 ms
Lead Channel Pacing Threshold Pulse Width: 0.4 ms
Lead Channel Pacing Threshold Pulse Width: 0.4 ms
Lead Channel Sensing Intrinsic Amplitude: 11.25 mV
Lead Channel Sensing Intrinsic Amplitude: 11.25 mV
Lead Channel Sensing Intrinsic Amplitude: 3.25 mV
Lead Channel Sensing Intrinsic Amplitude: 4.5 mV
Lead Channel Setting Pacing Amplitude: 1.75 V
Lead Channel Setting Pacing Amplitude: 2 V
Lead Channel Setting Pacing Amplitude: 2.5 V
Lead Channel Setting Pacing Pulse Width: 0.4 ms
Lead Channel Setting Pacing Pulse Width: 0.4 ms
Lead Channel Setting Sensing Sensitivity: 0.3 mV

## 2018-12-25 MED ORDER — POTASSIUM CHLORIDE ER 20 MEQ PO TBCR: 20.0000 meq | EXTENDED_RELEASE_TABLET | Freq: Every day | ORAL | 1 refills | Status: DC

## 2018-12-25 MED ORDER — TORSEMIDE 20 MG PO TABS: ORAL_TABLET | ORAL | 1 refills | Status: DC

## 2018-12-25 NOTE — Progress Notes (Signed)
Make sure she has followup with me or [name].

## 2018-12-25 NOTE — Patient Instructions (Addendum)
Medication Instructions:   Start  taking Torsemide 40 mg once a day   Tonight and tomorrow evening  40 mg of torsemide   Tonight and tomorrow  evening  With  20 meq potassium    If you need a refill on your cardiac medications before your next appointment, please call your pharmacy.   Lab work:  BMET AND MAG TODAY   If you have labs (blood work) drawn today and your tests are completely normal, you will receive your results only by: [name] MyChart Message (if you have MyChart) OR . A paper copy in the mail If you have any lab test that is abnormal or we need to change your treatment, we will call you to review the results.   Testing/Procedures: NONE ORDERED  TODAY   Follow-Up: At [facility], you and your health needs are our priority.  As part of our continuing mission to provide you with exceptional heart care, we have created designated Provider Care Teams.  These Care Teams include your primary Cardiologist (physician) and Advanced Practice Providers (APPs -  Physician Assistants and Nurse Practitioners) who all work together to provide you with the care you need, when you need it. You will need a follow up appointment in 6 months.  Please call our office 2 months in advance to schedule this appointment.  You may see Dr Caryl Comes or one of the following Advanced Practice Providers on your designated Care Team:   Chanetta Marshall, NP . [name], PA-C  Any Other Special Instructions Will Be Listed Below (If Applicable).

## 2018-12-25 NOTE — Progress Notes (Signed)
Electrophysiology Office Note Date: 12/25/2018  ID:  Morgan Clay, DOB Jun 24, 1945, MRN [id number]  PCP: Billie Ruddy, MD Primary Cardiologist: Virl Axe, MD Electrophysiologist: None  CC: Routine ICD follow-up  Morgan Clay is a 73 y.o. female seen today for Dr. Caryl Comes.  She presents today for routine electrophysiology followup.  Since last being seen in our clinic, the patient reports doing well overall until this week. Since Saturday night into Sunday morning, she has had decreased energy, more SOB, and orthopnea.  She says she has been trying to watch her fluid and salt. She takes torsemide 40 mg alternating with 20 mg daily. She denies chest pain, nausea, vomiting, dizziness, syncope, or early satiety.  She is up about 10 lbs on her home scale.   Device History: Medtronic BiV ICD implanted 10/06/2015 for Chronic systolic CHF History of appropriate therapy: No History of AAD therapy: No   Past Medical History:  Diagnosis Date  . A-fib ([place])    on Eliquis  . AICD (automatic cardioverter/defibrillator) present 10/06/2015  . Anemia   . ARF (acute renal failure) ([place])   . Arthritis    "qwhere" (01/03/2018)  . Asthma    reports mild asthma since childhood - had COPD on dx list from prior PCP  . Chronic bronchitis ([place])    "get it most years; not this past year though" (01/03/2018)  . Chronic kidney disease    "? stage;  followed by [facility]; [name]" (01/03/2018)  . Chronic systolic congestive heart failure ([place])   . DEPRESSION 12/17/2009   Annotation: PHQ-9 score = 14 done on 12/17/2009 Qualifier: Diagnosis of  By: Hassell Done FNP, Tori Milks    . Diabetes mellitus without complication (HCC)    DIET CONTROLLED   . Diverticulosis   . Enteritis   . FIBROIDS, UTERUS 03/05/2008  . GI bleeding 01/03/2018  . Glaucoma   . Gout   . Hepatitis C    HEPATITIS C - s/p treatment with Harvoni, saw hepatology, Dawn Drazek  . History of blood transfusion ~ 11/2017  .  Hyperlipemia 12/06/2012  . HYPERTENSION, BENIGN 04/24/2007  . Hypothyroidism   . LBBB (left bundle branch block)   . Lower GI bleeding    "been dealing w/it since 07/2017" (01/03/2018)  . Nonischemic cardiomyopathy ([place])   . OBESITY 05/27/2009  . OBSTRUCTIVE SLEEP APNEA 11/14/2007   no CPAP  . OSTEOPENIA 09/30/2008  . Pain and swelling of left upper extremity 08/08/2017  . Personal history of other infectious and parasitic disease    Hepatitis B  . Pneumonia    "several times" (01/03/2018)  . Pulmonary edema, acute ([place]) 08/09/2017   Past Surgical History:  Procedure Laterality Date  . APPENDECTOMY    . CARDIAC CATHETERIZATION    . CARDIOVERSION N/A 12/14/2014   Procedure: CARDIOVERSION;  Surgeon: Dorothy Spark, MD;  Location: [facility] ENDOSCOPY;  Service: Cardiovascular;  Laterality: N/A;  . CARDIOVERSION N/A 02/26/2017   Procedure: CARDIOVERSION;  Surgeon: Evans Lance, MD;  Location: [place] CV LAB;  Service: Cardiovascular;  Laterality: N/A;  . CARDIOVERSION N/A 07/24/2017   Procedure: CARDIOVERSION;  Surgeon: Thayer Headings, MD;  Location: [facility] ENDOSCOPY;  Service: Cardiovascular;  Laterality: N/A;  . COLONOSCOPY N/A 11/08/2017   Procedure: COLONOSCOPY;  Surgeon: Milus Banister, MD;  Location: [facility] ENDOSCOPY;  Service: Endoscopy;  Laterality: N/A;  . DILATION AND CURETTAGE OF UTERUS    . EP IMPLANTABLE DEVICE N/A 10/06/2015   Procedure: BIV ICD Generator Changeout;  Surgeon: [name], MD;  Location: [place] CV LAB;  Service: Cardiovascular;  Laterality: N/A;  . ESOPHAGOGASTRODUODENOSCOPY N/A 10/26/2017   Procedure: ESOPHAGOGASTRODUODENOSCOPY (EGD);  Surgeon: Ladene Artist, MD;  Location: [facility] ENDOSCOPY;  Service: Endoscopy;  Laterality: N/A;  . ESOPHAGOGASTRODUODENOSCOPY (EGD) WITH PROPOFOL N/A 11/07/2017   Procedure: ESOPHAGOGASTRODUODENOSCOPY (EGD) WITH PROPOFOL;  Surgeon: Milus Banister, MD;  Location: [facility] ENDOSCOPY;  Service: Endoscopy;  Laterality: N/A;  . HOT HEMOSTASIS  N/A 10/26/2017   Procedure: HOT HEMOSTASIS (ARGON PLASMA COAGULATION/BICAP);  Surgeon: Ladene Artist, MD;  Location: [facility] ENDOSCOPY;  Service: Endoscopy;  Laterality: N/A;  . INSERT / REPLACE / REMOVE PACEMAKER  ?2008  . POLYPECTOMY  11/08/2017   Procedure: POLYPECTOMY;  Surgeon: Milus Banister, MD;  Location: [facility] ENDOSCOPY;  Service: Endoscopy;;  . PROCTOSCOPY N/A 05/22/2018   Procedure: RIGID PROCTOSCOPY;  Surgeon: Josalynn Johndrow Boston, MD;  Location: [facility] ORS;  Service: General;  Laterality: N/A;  . RIGHT HEART CATH N/A 02/13/2018   Procedure: RIGHT HEART CATH;  Surgeon: [name], MD;  Location: [place] CV LAB;  Service: Cardiovascular;  Laterality: N/A;  . TUBAL LIGATION    . XI ROBOTIC ASSISTED LOWER ANTERIOR RESECTION N/A 05/22/2018   Procedure: XI ROBOTIC ASSISTED SIGMOID COLOECTOMY MOBILIZATION OF SPLENIC FLEXURE, FIREFLY ASSESSMENT OF PERFUSION;  Surgeon: Nalea Salce Boston, MD;  Location: [facility] ORS;  Service: General;  Laterality: N/A;  ERAS PATHWAY    Current Outpatient Medications  Medication Sig Dispense Refill  . acetaminophen (TYLENOL) 325 MG tablet Take 2 tablets (650 mg total) by mouth every 6 (six) hours as needed.    [name] albuterol (PROVENTIL HFA;VENTOLIN HFA) 108 (90 Base) MCG/ACT inhaler Inhale 2 puffs into the lungs every 6 (six) hours as needed for wheezing or shortness of breath. 1 Inhaler 3  . allopurinol (ZYLOPRIM) 300 MG tablet Take 300 mg by mouth daily.    [name] BIDIL 20-37.5 MG tablet Take 1 tablet by mouth twice daily 180 tablet 0  . carvedilol (COREG) 25 MG tablet TAKE 1 TABLET BY MOUTH TWICE DAILY 180 tablet 1  . colchicine 0.6 MG tablet Take 0.6 mg by mouth daily as needed (gout flares).   0  . gabapentin (NEURONTIN) 100 MG capsule Take 1 capsule (100 mg total) by mouth 3 (three) times daily. 90 capsule 0  . gabapentin (NEURONTIN) 300 MG capsule Take 1 capsule (300 mg total) by mouth 3 (three) times daily. 12 capsule 0  . levothyroxine (SYNTHROID) 137 MCG tablet Take 1  tablet (137 mcg total) by mouth daily before breakfast. 30 tablet 0  . nitroGLYCERIN (NITROSTAT) 0.4 MG SL tablet Place 0.4 mg under the tongue every 5 (five) minutes as needed for chest pain.    . pantoprazole (PROTONIX) 40 MG tablet Take 1 tablet (40 mg total) by mouth daily. 30 tablet 4  . Potassium Chloride ER 20 MEQ TBCR Take 20 mEq by mouth daily. 90 tablet 1  . sertraline (ZOLOFT) 50 MG tablet Take 1 tablet (50 mg total) by mouth daily. 30 tablet 3  . torsemide (DEMADEX) 20 MG tablet Take 2 tablets (40 mg total) 180 tablet 1  . traMADol (ULTRAM) 50 MG tablet Take 1-2 tablets (50-100 mg total) by mouth every 6 (six) hours as needed for moderate pain or severe pain (mild pain). 20 tablet 0  . traZODone (DESYREL) 50 MG tablet Take 1 tablet (50 mg total) by mouth at bedtime. 90 tablet 1  . Vitamin D, Ergocalciferol, (DRISDOL) 1.25 MG (50000 UT)  CAPS capsule Take 1 capsule (50,000 Units total) by mouth every 7 (seven) days. 12 capsule 0   No current facility-administered medications for this visit.     Allergies:   Patient has no known allergies.   Social History: Social History   Socioeconomic History  . Marital status: Divorced    Spouse name: Not on file  . Number of children: 1  . Years of education: 54  . Highest education level: Not on file  Occupational History  . Not on file  Social Needs  . Financial resource strain: Not hard at all  . Food insecurity    Worry: Never true    Inability: Sometimes true  . Transportation needs    Medical: No    Non-medical: No  Tobacco Use  . Smoking status: Never Smoker  . Smokeless tobacco: Never Used  Substance and Sexual Activity  . Alcohol use: Yes    Comment: 01/03/2018 "couple drinks/month"  . Drug use: Yes    Types: Marijuana    Comment: VERY INTERMITTENT   . Sexual activity: Not Currently  Lifestyle  . Physical activity    Days per week: Not on file    Minutes per session: Not on file  . Stress: Not on file   Relationships  . Social Herbalist on phone: Not on file    Gets together: Not on file    Attends religious service: Not on file    Active member of club or organization: Not on file    Attends meetings of clubs or organizations: Not on file    Relationship status: Not on file  . Intimate partner violence    Fear of current or ex partner: Not on file    Emotionally abused: Not on file    Physically abused: Not on file    Forced sexual activity: Not on file  Other Topics Concern  . Not on file  Social History Narrative   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      Home Situation: lives alone      Spiritual Beliefs: Christian - [employer]      Lifestyle: no regular exercise; poor diet      She is divorced at least one adult child   Never smoker    rare alcohol to occasional at best    no substance abuse          Family History: Family History  Problem Relation Age of Onset  . Asthma Father   . Heart attack Father   . Asthma Sister   . Lung cancer Sister   . Heart attack Mother   . Stroke Brother   . Colon cancer Neg Hx   . Esophageal cancer Neg Hx   . Pancreatic cancer Neg Hx   . Stomach cancer Neg Hx   . Liver disease Neg Hx     Review of Systems: All other systems reviewed and are otherwise negative except as noted above.   Physical Exam: Vitals:   12/25/18 1440  BP: 122/78  Pulse: 67  Weight: 150 lb (68 kg)     GEN- The patient is fatigued appearing, alert and oriented x 3 today.   HEENT: normocephalic, atraumatic; sclera clear, conjunctiva pink; hearing intact; oropharynx clear; neck supple, JVP 9-10 cm+ Lymph- no cervical lymphadenopathy Lungs- Clear to ausculation bilaterally, normal work of breathing.  No wheezes, rales, rhonchi Heart- Regular rate and rhythm, no murmurs, rubs or gallops, PMI not laterally displaced GI-  Obese, soft, non-tender, non-distended, bowel sounds present, no hepatosplenomegaly Extremities- no clubbing,  cyanosis, or edema; DP/PT/radial pulses 2+ bilaterally MS- no significant deformity or atrophy Skin- warm and dry, no rash or lesion; ICD pocket well healed Psych- euthymic mood, full affect Neuro- strength and sensation are intact  ICD interrogation- reviewed in detail today,  See PACEART report  EKG:  EKG is ordered today. The ekg ordered today shows AS-VP rhythm at 67 bpm, personally reviewed.   Recent Labs: 03/05/2018: ALT 23 05/25/2018: Magnesium 2.1 11/27/2018: BUN 35; Creatinine, Ser 1.57; Potassium 3.9; Sodium 139; TSH 2.51 12/12/2018: Hemoglobin 11.4; Platelet Count 155   Wt Readings from Last 3 Encounters:  12/12/18 147 lb 2 oz (66.7 kg)  11/27/18 148 lb (67.1 kg)  07/31/18 138 lb (62.6 kg)     Other studies Reviewed: Additional studies/ records that were reviewed today include: Previous records, echo, cath reports, and remote checks.    Assessment and Plan:  1.  Acute on chronic diastolic dysfunction Volume elevated on exam today with orthopnea.  Will have take torsemide 40 mg BID today and tomorrow, then increase chronic dosing back to 40 mg daily.  Stable on an appropriate medical regimen Normal ICD function. No VT/VF. No shocks.  See Pace Art report No changes today  2. Paroxysmal atrial fibrillation/atypical flutter By device, she has had increased burden since 12/22/2018. No episodes today. Suspect this is due to volume overload. Plan as above.  CHA2DS2/VASc is at least 5, but she has been taken off Eliquis due to recurrent GI bleeds  3. CKD 3 BMET today. Follow closely with diuretic adjustment.   4. GI bleeding She has not had recurrence since she has been off of Eliquis.   Current medicines are reviewed at length with the patient today.   The patient does not have concerns regarding her medicines.  The following changes were made today:  Sliding scale diuretics, increase torsemide to 40 mg daily after BID x 2 days.   Labs/ tests ordered today include:   Orders Placed This Encounter  Procedures  . Basic metabolic panel  . Magnesium  . EKG 12-Lead   Disposition:   Follow up with Dr. Caryl Comes in 6 months. Will send chart to Dr. [name] as an Juluis Rainier.   Morgan Lefevre, PA-C  12/25/2018 3:29 PM  [place]  [phone] (office) [phone] (fax)

## 2018-12-26 LAB — BASIC METABOLIC PANEL
BUN/Creatinine Ratio: 17 (ref 12–28)
BUN: 33 mg/dL — ABNORMAL HIGH (ref 8–27)
CO2: 23 mmol/L (ref 20–29)
Calcium: 9.7 mg/dL (ref 8.7–10.3)
Chloride: 100 mmol/L (ref 96–106)
Creatinine, Ser: 1.98 mg/dL — ABNORMAL HIGH (ref 0.57–1.00)
GFR calc Af Amer: 29 mL/min/{1.73_m2} — ABNORMAL LOW (ref >59)
GFR calc non Af Amer: 25 mL/min/{1.73_m2} — ABNORMAL LOW (ref >59)
Glucose: 135 mg/dL — ABNORMAL HIGH (ref 65–99)
Potassium: 3.8 mmol/L (ref 3.5–5.2)
Sodium: 140 mmol/L (ref 134–144)

## 2018-12-26 LAB — MAGNESIUM: Magnesium: 2.2 mg/dL (ref 1.6–2.3)

## 2018-12-30 ENCOUNTER — Ambulatory Visit (INDEPENDENT_AMBULATORY_CARE_PROVIDER_SITE_OTHER): Payer: Medicare Other

## 2018-12-30 DIAGNOSIS — I5032 Chronic diastolic (congestive) heart failure: Secondary | ICD-10-CM

## 2018-12-30 DIAGNOSIS — Z95810 Presence of automatic (implantable) cardiac defibrillator: Secondary | ICD-10-CM | POA: Diagnosis not present

## 2018-12-31 ENCOUNTER — Other Ambulatory Visit: Payer: Self-pay | Admitting: Family Medicine

## 2018-12-31 NOTE — Telephone Encounter (Signed)
Dr. Cain Saupe patient.

## 2019-01-01 ENCOUNTER — Telehealth (HOSPITAL_COMMUNITY): Payer: Self-pay

## 2019-01-01 ENCOUNTER — Other Ambulatory Visit: Payer: Self-pay | Admitting: *Deleted

## 2019-01-01 ENCOUNTER — Telehealth: Payer: Self-pay

## 2019-01-01 DIAGNOSIS — I5032 Chronic diastolic (congestive) heart failure: Secondary | ICD-10-CM

## 2019-01-01 NOTE — Telephone Encounter (Signed)
Nurse from [facility] left message on triage line stating patient had been having concerns about her medications not working.  The message reports that patient did not have worsening breathing symptoms however they were not much better since being seen. Called patient today and she states she was given an appt for tomorrow and will address at her appt with Dr [name].

## 2019-01-01 NOTE — Telephone Encounter (Signed)
Remote ICM transmission received.  Attempted call to patient regarding ICM remote transmission and left detailed message, per DPR, to return call.    

## 2019-01-01 NOTE — Progress Notes (Signed)
EPIC Encounter for ICM Monitoring  Patient Name: Morgan Clay is a 73 y.o. female Date: 01/01/2019 Primary Care Physican: Billie Ruddy, MD Primary Cardiologist:[place] Electrophysiologist: Caryl Comes Nephrologist: [name] Bi-V Pacing:96.2% 11/01/2018 Weight: 138 lbs   Attempted call to patient and unable to reach.  Left message to return call. Transmission reviewed.  Oda Kilts, PA increased Lasix for 2 days at 8/19 OV due to SOB, 10 lb weight gain and unable to sleep lying down.   Optivol thoracic impedanceimproved after taking extra Lasix for 2 days and close to baseline.  Prescribed:Torsemide20 mgTake 2 tablets (40 mg total)daily.  Labs: 12/25/2018 Creatinine 1.98, BUN 38, Potassium 3.8, Sodium 140 11/27/2018 Creatinine 1.57, BUN 35, Potassium 3.9, Sodium 139, GFR 39.31 07/19/2018 Creatinine 1.80, BUN 37, Potassium 4.5, Sodium 138, GFR 28-32 07/01/2018 Creatinine2.00, BUN45, Potassium4.1, [id number] 06/03/2018 Creatinine1.87, BUN27, Potassium3.7, Sodium136, GFR32.18  05/23/2018 Creatinine2.34, BUN41, Potassium4.3, Sodium135  05/20/2018 Creatinine1.82, BUN50, Potassium3.6, Sodium139  A complete set of results can be found in Results Review.  Recommendations:Unable to reach.    Follow-up plan: ICM clinic phone appointment on10/04/2019.91 day device clinic remote transmission 02/04/2019.OV with Dr [name] 01/02/2019.  Copy of ICM check sent to Morgan.    3 month ICM trend: 12/30/2018    1 Year ICM trend:       [name], RN 01/01/2019 11:22 AM

## 2019-01-02 ENCOUNTER — Other Ambulatory Visit: Payer: Self-pay

## 2019-01-02 ENCOUNTER — Ambulatory Visit (HOSPITAL_COMMUNITY)
Admission: RE | Admit: 2019-01-02 | Discharge: 2019-01-02 | Disposition: A | Discharge status: Home / Self Care | Payer: Medicare Other | Source: Ambulatory Visit | Attending: Cardiology | Admitting: Cardiology

## 2019-01-02 ENCOUNTER — Encounter (HOSPITAL_COMMUNITY): Payer: Self-pay | Admitting: Cardiology

## 2019-01-02 VITALS — BP 120/80 | HR 74 | Wt 149.4 lb

## 2019-01-02 DIAGNOSIS — I4892 Unspecified atrial flutter: Secondary | ICD-10-CM | POA: Insufficient documentation

## 2019-01-02 DIAGNOSIS — E1122 Type 2 diabetes mellitus with diabetic chronic kidney disease: Secondary | ICD-10-CM | POA: Diagnosis not present

## 2019-01-02 DIAGNOSIS — I428 Other cardiomyopathies: Secondary | ICD-10-CM

## 2019-01-02 DIAGNOSIS — E669 Obesity, unspecified: Secondary | ICD-10-CM | POA: Diagnosis not present

## 2019-01-02 DIAGNOSIS — G4733 Obstructive sleep apnea (adult) (pediatric): Secondary | ICD-10-CM | POA: Insufficient documentation

## 2019-01-02 DIAGNOSIS — I4891 Unspecified atrial fibrillation: Secondary | ICD-10-CM | POA: Insufficient documentation

## 2019-01-02 DIAGNOSIS — Z79899 Other long term (current) drug therapy: Secondary | ICD-10-CM | POA: Diagnosis not present

## 2019-01-02 DIAGNOSIS — Z7901 Long term (current) use of anticoagulants: Secondary | ICD-10-CM | POA: Insufficient documentation

## 2019-01-02 DIAGNOSIS — Z6830 Body mass index (BMI) 30.0-30.9, adult: Secondary | ICD-10-CM | POA: Diagnosis not present

## 2019-01-02 DIAGNOSIS — Z8249 Family history of ischemic heart disease and other diseases of the circulatory system: Secondary | ICD-10-CM | POA: Insufficient documentation

## 2019-01-02 DIAGNOSIS — I130 Hypertensive heart and chronic kidney disease with heart failure and stage 1 through stage 4 chronic kidney disease, or unspecified chronic kidney disease: Secondary | ICD-10-CM | POA: Insufficient documentation

## 2019-01-02 DIAGNOSIS — I480 Paroxysmal atrial fibrillation: Secondary | ICD-10-CM

## 2019-01-02 DIAGNOSIS — N183 Chronic kidney disease, stage 3 (moderate): Secondary | ICD-10-CM | POA: Diagnosis not present

## 2019-01-02 DIAGNOSIS — I5032 Chronic diastolic (congestive) heart failure: Secondary | ICD-10-CM

## 2019-01-02 LAB — BASIC METABOLIC PANEL
Anion gap: 10 (ref 5–15)
BUN: 37 mg/dL — ABNORMAL HIGH (ref 8–23)
CO2: 24 mmol/L (ref 22–32)
Calcium: 9.4 mg/dL (ref 8.9–10.3)
Chloride: 102 mmol/L (ref 98–111)
Creatinine, Ser: 1.91 mg/dL — ABNORMAL HIGH (ref 0.44–1.00)
GFR calc Af Amer: 30 mL/min — ABNORMAL LOW (ref >60)
GFR calc non Af Amer: 26 mL/min — ABNORMAL LOW (ref >60)
Glucose, Bld: 102 mg/dL — ABNORMAL HIGH (ref 70–99)
Potassium: 4.2 mmol/L (ref 3.5–5.1)
Sodium: 136 mmol/L (ref 135–145)

## 2019-01-02 LAB — CBC
HCT: 40.5 % (ref 36.0–46.0)
Hemoglobin: 12.9 g/dL (ref 12.0–15.0)
MCH: 29.3 pg (ref 26.0–34.0)
MCHC: 31.9 g/dL (ref 30.0–36.0)
MCV: 92 fL (ref 80.0–100.0)
Platelets: 151 10*3/uL (ref 150–400)
RBC: 4.4 MIL/uL (ref 3.87–5.11)
RDW: 17.6 % — ABNORMAL HIGH (ref 11.5–15.5)
WBC: 4.1 10*3/uL (ref 4.0–10.5)
nRBC: 0 % (ref 0.0–0.2)

## 2019-01-02 LAB — TSH: TSH: 8.2 u[IU]/mL — ABNORMAL HIGH (ref 0.350–4.500)

## 2019-01-02 MED ORDER — TORSEMIDE 20 MG PO TABS: ORAL_TABLET | ORAL | 1 refills | Status: DC

## 2019-01-02 MED ORDER — FUROSEMIDE 10 MG/ML IJ SOLN
80.0000 mg | Freq: Once | INTRAMUSCULAR | Status: AC
  Administered 2019-01-02: 13:00:00 80 mg via INTRAVENOUS

## 2019-01-02 MED ORDER — APIXABAN 5 MG PO TABS: 5.0000 mg | ORAL_TABLET | Freq: Two times a day (BID) | ORAL | 11 refills | Status: DC

## 2019-01-02 MED ORDER — POTASSIUM CHLORIDE CRYS ER 20 MEQ PO TBCR
20.0000 meq | EXTENDED_RELEASE_TABLET | Freq: Once | ORAL | Status: AC
  Administered 2019-01-02: 13:00:00 20 meq via ORAL

## 2019-01-02 MED ORDER — POTASSIUM CHLORIDE ER 20 MEQ PO TBCR: 40.0000 meq | EXTENDED_RELEASE_TABLET | Freq: Every day | ORAL | 1 refills | Status: DC

## 2019-01-02 MED ORDER — METOLAZONE 2.5 MG PO TABS: 2.5000 mg | ORAL_TABLET | ORAL | 3 refills | Status: DC

## 2019-01-02 NOTE — Patient Instructions (Addendum)
RESTART Eliquis 5mg  twice daily   INCREASE Torsemide to 60 mg in the AM and 40 mg in the PM HOLD YOUR PM DOSE OF TORSEMIDE TODAY 01/02/19, AS YOU HAVE RECEIVED A IV DOSE OF LASIX IN THE OFFICE  INCREASE Potassium to 40 meq daily  START Metolazone 2.5 mg, take one dose tomorrow 01/02/19 with your morning dose of torsemide -with every dose of metolazone be sure to take and EXTRA 20 meq of potassium (60 meq total for the day)   Labs today We will only contact you if something comes back abnormal or we need to make some changes. Otherwise no news is good news!  Your physician recommends that you schedule a follow-up appointment in: 1 week with [name] Clegg,NP  Do the following things EVERYDAY: 1) Weigh yourself in the morning before breakfast. Write it down and keep it in a log. 2) Take your medicines as prescribed 3) Eat low salt foods-Limit salt (sodium) to 2000 mg per day.  4) Stay as active as you can everyday 5) Limit all fluids for the day to less than 2 liters  At the [facility], you and your health needs are our priority. As part of our continuing mission to provide you with exceptional heart care, we have created designated Provider Care Teams. These Care Teams include your primary Cardiologist (physician) and Advanced Practice Providers (APPs- Physician Assistants and Nurse Practitioners) who all work together to provide you with the care you need, when you need it.   You may see any of the following providers on your designated Care Team at your next follow up: [name] Dr Glori Bickers . Dr Loralie Champagne . Darrick Grinder, NP   Please be sure to bring in all your medications bottles to every appointment.

## 2019-01-02 NOTE — Progress Notes (Signed)
Pt urinated 251mL after 80mg  IV lasix given.

## 2019-01-02 NOTE — Progress Notes (Signed)
PCP: Dr. Colin Benton Cardiology: Dr. Caryl Comes HF Cardiology: Dr. [name]  73 y.o. with history of nonischemic cardiomyopathy with recovery of EF with BiV pacing, recurrent GI bleeding, and paroxysmal atrial flutter was referred by Dr. Caryl Comes for evaluation of CHF.  Patient has a history of presumed nonischemic cardiomyopathy for a number of years.  Echo in 12/15 showed EF 25-30% and Cardiolite at that time showed no ischemia.  She had Medtronic CRT-D system placed, and EF recovered. In 4/19, echo showed EF 60-65%.    She has additionally had problems with GI bleeding.  She had been on Eliquis for atrial flutter but this was stopped with recurrent GI bleeding.  She had bleeding from a gastric ulcer but was also noted to have multiple small bowel AVMs on capsule endoscopy out of reach of endoscope.  Eliquis was stopped. She has had no melena or BRBPR off Eliquis.   Echo in 10/19 showed EF 55-60%, mild-moderate MR, moderate AI, moderate RV dilation.   She was admitted in 1/20 with diverticulitis and ended up having a sigmoid colectomy.    She returns for followup of CHF. Torsemide was recently increased to 40 mg bid due to dyspnea.  She reports 2-3 weeks of fatigue, shortness of breath walking a short distance, and orthopnea.  Says she feels "terrible."  No chest pain.  Prior to the last few weeks, she had been feeling good and cannot identify what caused the change.  Weight is up about 5 lbs. She says the recent increase in torsemide has not helped much. No BRBPR/melena.   Medtronic device interrogation: Fluid index > threshold, >99% BiV pacing, she had several days of atrial fibrillation earlier this month but is back in NSR.   ECG (8/20, personally reviewed): NSR, BiV paced  Labs (9/19):  Hgb 10.2, K 3.8, creatinine 2.5 Labs (02/26/2018): K 3.4, creatinine 2.2, WBC 76.  Labs (2/20): K 3.9, creatinine 1.8 Labs (8/20): K 3.8, creatinine 1.98  PMH: 1. Diabetes: diet-controlled.  2. Gout 3. HCV:  Treated with Harvoni.  4. Atrial flutter: Atypical.  Had DCCV in 3/19, maintained on amiodarone.  5. HTN 6. CKD stage 3: Sees nephrology, [name].  7. H/o SBO 8. Obesity 9. Nonischemic cardiomyopathy: Echo in 12/15 with EF 25-30%.  Cardiolite in 12/15 with no ischemia.  She had a Medtronic CRT-D device placed with recovery of EF.   - Echo (4/19): EF 60-65% with moderate AI, moderate MR, and PASP 49 mmHg.  - RHC 02/13/2018 RA mean 6, RV 49/7,PA 50/18, mean 31,PCWP mean 20, Oxygen saturations:PA 67% AO 97%, Cardiac Output (Fick) 4.6, Cardiac Index (Fick) 2.9, PVR 2.4 WU - Echo (10/19): EF 55-60%, mild-moderate MR, moderate AI, moderate RV dilation, PASP 60 mmHg.  - PYP scan (3/20): H/CL 1.3, grade 1-2 visually.  Unlikely transthyretin cardiac amyloidosis.  10. GI bleeding: Gastric AVM and also gastric ulcer seen in past.  - 8/19 capsule endoscopy showed small bowel AVMs beyond the reach of endoscopy.  - With recurrent episodes of GI bleeding, Eliquis was stopped.  11. Fe deficiency anemia.  12. Depression 13. OSA: Uses CPAP.  14. Diverticulitis: Sigmoid colectomy in 1/20.   Social History   Socioeconomic History  . Marital status: Divorced    Spouse name: Not on file  . Number of children: 1  . Years of education: 58  . Highest education level: Not on file  Occupational History  . Not on file  Social Needs  . Financial resource strain: Not hard  at all  . Food insecurity    Worry: Never true    Inability: Sometimes true  . Transportation needs    Medical: No    Non-medical: No  Tobacco Use  . Smoking status: Never Smoker  . Smokeless tobacco: Never Used  Substance and Sexual Activity  . Alcohol use: Yes    Comment: 01/03/2018 "couple drinks/month"  . Drug use: Yes    Types: Marijuana    Comment: VERY INTERMITTENT   . Sexual activity: Not Currently  Lifestyle  . Physical activity    Days per week: Not on file    Minutes per session: Not on file  . Stress: Not on file   Relationships  . Social Herbalist on phone: Not on file    Gets together: Not on file    Attends religious service: Not on file    Active member of club or organization: Not on file    Attends meetings of clubs or organizations: Not on file    Relationship status: Not on file  . Intimate partner violence    Fear of current or ex partner: Not on file    Emotionally abused: Not on file    Physically abused: Not on file    Forced sexual activity: Not on file  Other Topics Concern  . Not on file  Social History Narrative   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      Home Situation: lives alone      Spiritual Beliefs: Christian - [employer]      Lifestyle: no regular exercise; poor diet      She is divorced at least one adult child   Never smoker    rare alcohol to occasional at best    no substance abuse         Family History  Problem Relation Age of Onset  . Asthma Father   . Heart attack Father   . Asthma Sister   . Lung cancer Sister   . Heart attack Mother   . Stroke Brother   . Colon cancer Neg Hx   . Esophageal cancer Neg Hx   . Pancreatic cancer Neg Hx   . Stomach cancer Neg Hx   . Liver disease Neg Hx    ROS: All systems reviewed and negative except as per HPI.   Current Outpatient Medications  Medication Sig Dispense Refill  . albuterol (PROVENTIL HFA;VENTOLIN HFA) 108 (90 Base) MCG/ACT inhaler Inhale 2 puffs into the lungs every 6 (six) hours as needed for wheezing or shortness of breath. 1 Inhaler 3  . allopurinol (ZYLOPRIM) 300 MG tablet Take 300 mg by mouth daily.    [name] BIDIL 20-37.5 MG tablet Take 1 tablet by mouth twice daily 180 tablet 0  . carvedilol (COREG) 25 MG tablet TAKE 1 TABLET BY MOUTH TWICE DAILY 180 tablet 1  . colchicine 0.6 MG tablet Take 0.6 mg by mouth daily as needed (gout flares).   0  . EUTHYROX 137 MCG tablet TAKE 1 TABLET BY MOUTH ONCE DAILY BEFORE BREAKFAST 30 tablet 0  . gabapentin (NEURONTIN) 100 MG capsule Take 1  capsule (100 mg total) by mouth 3 (three) times daily. 90 capsule 0  . nitroGLYCERIN (NITROSTAT) 0.4 MG SL tablet Place 0.4 mg under the tongue every 5 (five) minutes as needed for chest pain.    . pantoprazole (PROTONIX) 40 MG tablet Take 1 tablet (40 mg total) by mouth daily. 30 tablet 4  .  Potassium Chloride ER 20 MEQ TBCR Take 40 mEq by mouth daily. 180 tablet 1  . sertraline (ZOLOFT) 50 MG tablet Take 1 tablet (50 mg total) by mouth daily. 30 tablet 3  . torsemide (DEMADEX) 20 MG tablet Take 3 tablets (60 mg total) by mouth every morning AND 2 tablets (40 mg total) every evening. 150 tablet 1  . traMADol (ULTRAM) 50 MG tablet Take 1-2 tablets (50-100 mg total) by mouth every 6 (six) hours as needed for moderate pain or severe pain (mild pain). 20 tablet 0  . traZODone (DESYREL) 50 MG tablet Take 1 tablet (50 mg total) by mouth at bedtime. 90 tablet 1  . Vitamin D, Ergocalciferol, (DRISDOL) 1.25 MG (50000 UT) CAPS capsule Take 1 capsule (50,000 Units total) by mouth every 7 (seven) days. 12 capsule 0  . apixaban (ELIQUIS) 5 MG TABS tablet Take 1 tablet (5 mg total) by mouth 2 (two) times daily. 60 tablet 11  . metolazone (ZAROXOLYN) 2.5 MG tablet Take 1 tablet (2.5 mg total) by mouth as directed for 1 day. Then as directed by the CHF clinic 5 tablet 3   No current facility-administered medications for this encounter.    BP 120/80   Pulse 74   Wt 67.8 kg (149 lb 6.4 oz)   SpO2 98%   BMI 30.18 kg/m   Wt Readings from Last 3 Encounters:  01/02/19 67.8 kg (149 lb 6.4 oz)  12/25/18 68 kg (150 lb)  12/12/18 66.7 kg (147 lb 2 oz)   General: NAD, mildly tachypneic Neck: JVP 14, no thyromegaly or thyroid nodule.  Lungs: Clear to auscultation bilaterally with normal respiratory effort. CV: Nondisplaced PMI.  Heart regular S1/S2, no S3/S4, no murmur.  No peripheral edema.  No carotid bruit.  Normal pedal pulses.  Abdomen: Soft, nontender, no hepatosplenomegaly, no distention.  Skin: Intact  without lesions or rashes.  Neurologic: Alert and oriented x 3.  Psych: Normal affect. Extremities: No clubbing or cyanosis.  HEENT: Normal.   Assessment/Plan: 1. Chronic diastolic CHF: Patient has history of presumed nonischemic cardiomyopathy (Cardiolite with no ischemia, cath not done with CKD). However, echo in 4/19 showed EF improved to 60-65% with BiV pacing.  Echo in 10/19 showed stable EF 55-60% with mild-moderate MR, moderate AI.  PYP scan in 3/20 was not suggestive of transthyretin amyloidosis.  For the last several weeks, she has had increased dyspnea (NYHA class III).  She is volume overloaded on exam today and by Optivol.  Weight is up.  She feels looks uncomfortable.  Recent increase in torsemide has not helped much.  - I will give her a dose of Lasix 80 mg IV in clinic today to replace her afternoon torsemide.  - Increase torsemide to 60 qam/40 qpm and will increase KCl to 40 daily.  - She will take 1 dose of metolazone 2.5 mg x 1 tomorrow with am torsemide (she will take KCl 60 mEq that day only).  - If she does not improve, may need admission +/- RHC.  - BMET today and again in 1 week. - I will obtain repeat echo to make sure EF remains normal range.   2. Atrial flutter/fibrillation: History of atypical flutter, 3/19 DCCV.  Several days of atrial fibrillation earlier this month but now back in NSR.  - Off Eliquis due to GI bleeding but has not had a recurrence for some time now.  CHADSVASC 5.  We discussed this today and will try her back on Eliquis 5 mg  bid.  If she develops BRBPR/melena, she will stop it again.  3. CKD: Stage 3, followed by [name].   4. GI bleeding: has had gastric ulcer and gastric AVMs as well as small bowel AVMs.  She is now off Eliquis and has not had recent bleeding. Followed by GI.   - As above, with high CHADSVASC score, I will rechallenge with Eliquis but low threshold to stop again.  - CBC today.  5. Hypertension: BP controlled.   Followup in 1  week with APP for reassessment.   Loralie Champagne 01/02/2019

## 2019-01-07 ENCOUNTER — Telehealth (HOSPITAL_COMMUNITY): Payer: Self-pay

## 2019-01-07 NOTE — Telephone Encounter (Signed)
Spoke with triage regarding upcoming appt on 9/4 @ 9:30 that needs to be changed due to provider emergency.  Triage advised that PT should keep echo appt but cancel f/u apt and we will f/u with PT regarding echo results and scheduled any appts needed at that time.

## 2019-01-10 ENCOUNTER — Other Ambulatory Visit: Payer: Self-pay

## 2019-01-10 ENCOUNTER — Ambulatory Visit (HOSPITAL_COMMUNITY)
Admission: RE | Admit: 2019-01-10 | Discharge: 2019-01-10 | Disposition: A | Discharge status: Home / Self Care | Payer: Medicare Other | Source: Ambulatory Visit | Attending: Internal Medicine | Admitting: Internal Medicine

## 2019-01-10 ENCOUNTER — Encounter (HOSPITAL_COMMUNITY): Payer: Medicare Other

## 2019-01-10 DIAGNOSIS — I5032 Chronic diastolic (congestive) heart failure: Secondary | ICD-10-CM | POA: Insufficient documentation

## 2019-01-10 DIAGNOSIS — I130 Hypertensive heart and chronic kidney disease with heart failure and stage 1 through stage 4 chronic kidney disease, or unspecified chronic kidney disease: Secondary | ICD-10-CM | POA: Diagnosis not present

## 2019-01-10 DIAGNOSIS — Z950 Presence of cardiac pacemaker: Secondary | ICD-10-CM | POA: Insufficient documentation

## 2019-01-10 DIAGNOSIS — G4730 Sleep apnea, unspecified: Secondary | ICD-10-CM | POA: Insufficient documentation

## 2019-01-10 DIAGNOSIS — E1122 Type 2 diabetes mellitus with diabetic chronic kidney disease: Secondary | ICD-10-CM | POA: Insufficient documentation

## 2019-01-10 DIAGNOSIS — I083 Combined rheumatic disorders of mitral, aortic and tricuspid valves: Secondary | ICD-10-CM | POA: Insufficient documentation

## 2019-01-10 DIAGNOSIS — N189 Chronic kidney disease, unspecified: Secondary | ICD-10-CM | POA: Insufficient documentation

## 2019-01-10 NOTE — Progress Notes (Signed)
  Echocardiogram 2D Echocardiogram has been performed.  Morgan Clay 01/10/2019, 8:52 AM

## 2019-01-14 DIAGNOSIS — I129 Hypertensive chronic kidney disease with stage 1 through stage 4 chronic kidney disease, or unspecified chronic kidney disease: Secondary | ICD-10-CM | POA: Diagnosis not present

## 2019-01-14 DIAGNOSIS — D631 Anemia in chronic kidney disease: Secondary | ICD-10-CM | POA: Diagnosis not present

## 2019-01-14 DIAGNOSIS — I5043 Acute on chronic combined systolic (congestive) and diastolic (congestive) heart failure: Secondary | ICD-10-CM | POA: Diagnosis not present

## 2019-01-14 DIAGNOSIS — N183 Chronic kidney disease, stage 3 (moderate): Secondary | ICD-10-CM | POA: Diagnosis not present

## 2019-01-14 DIAGNOSIS — R809 Proteinuria, unspecified: Secondary | ICD-10-CM | POA: Diagnosis not present

## 2019-01-14 DIAGNOSIS — M109 Gout, unspecified: Secondary | ICD-10-CM | POA: Diagnosis not present

## 2019-01-17 ENCOUNTER — Telehealth (HOSPITAL_COMMUNITY): Payer: Self-pay

## 2019-01-17 NOTE — Telephone Encounter (Signed)
-----   Message from [name], MD sent at 01/10/2019 12:34 PM EDT ----- EF is a bit lower at 45-50%.  Moderate AI. Normal RV.  No changes for now.

## 2019-01-17 NOTE — Telephone Encounter (Signed)
Pt aware of results and appreciative.  

## 2019-01-19 ENCOUNTER — Other Ambulatory Visit: Payer: Self-pay | Admitting: Internal Medicine

## 2019-01-21 ENCOUNTER — Ambulatory Visit
Admission: RE | Admit: 2019-01-21 | Discharge: 2019-01-21 | Disposition: A | Discharge status: Home / Self Care | Payer: Medicare Other | Source: Ambulatory Visit | Attending: Family Medicine | Admitting: Family Medicine

## 2019-01-21 ENCOUNTER — Other Ambulatory Visit: Payer: Self-pay

## 2019-01-21 DIAGNOSIS — Z1231 Encounter for screening mammogram for malignant neoplasm of breast: Secondary | ICD-10-CM

## 2019-01-21 LAB — HM MAMMOGRAPHY

## 2019-02-02 ENCOUNTER — Telehealth: Payer: Self-pay | Admitting: Family Medicine

## 2019-02-02 ENCOUNTER — Other Ambulatory Visit: Payer: Self-pay | Admitting: Family Medicine

## 2019-02-03 DIAGNOSIS — K7469 Other cirrhosis of liver: Secondary | ICD-10-CM | POA: Diagnosis not present

## 2019-02-03 NOTE — Telephone Encounter (Signed)
noted 

## 2019-02-03 NOTE — Telephone Encounter (Signed)
Filled in error please disregard. Sending to PCP CMA for refill if appropriate.

## 2019-02-04 ENCOUNTER — Ambulatory Visit (INDEPENDENT_AMBULATORY_CARE_PROVIDER_SITE_OTHER): Payer: Medicare Other | Admitting: *Deleted

## 2019-02-04 ENCOUNTER — Telehealth: Payer: Self-pay

## 2019-02-04 ENCOUNTER — Other Ambulatory Visit: Payer: Self-pay

## 2019-02-04 DIAGNOSIS — I428 Other cardiomyopathies: Secondary | ICD-10-CM | POA: Diagnosis not present

## 2019-02-04 MED ORDER — LEVOTHYROXINE SODIUM 137 MCG PO TABS: ORAL_TABLET | ORAL | 0 refills | Status: DC

## 2019-02-04 NOTE — Telephone Encounter (Signed)
Copied from [place] [id number]. Topic: Quick Communication - Rx Refill/Question >> Feb 04, 2019  8:28 AM Carolyn Stare wrote: RX did not go thru please resend   EUTHYROX 137 MCG tablet 30 tablet 0 02/03/2019   Sig: TAKE 1 TABLET BY MOUTH ONCE DAILY BEFORE BREAKFAST  Sent to pharmacy as: Arna Medici 137 MCG tablet  E-Prescribing Status: Transmission to pharmacy failed (02/03/2019 3:12 PM EDT)

## 2019-02-05 LAB — CUP PACEART REMOTE DEVICE CHECK
Battery Remaining Longevity: 42 mo
Battery Voltage: 2.97 V
Brady Statistic AP VP Percent: 47.16 %
Brady Statistic AP VS Percent: 0.02 %
Brady Statistic AS VP Percent: 52.73 %
Brady Statistic AS VS Percent: 0.09 %
Brady Statistic RA Percent Paced: 45.01 %
Brady Statistic RV Percent Paced: 96.67 %
Date Time Interrogation Session: 20200929041703
HighPow Impedance: 55 Ohm
HighPow Impedance: 82 Ohm
Implantable Lead Implant Date: 20080818
Implantable Lead Implant Date: 20080818
Implantable Lead Implant Date: 20080818
Implantable Lead Location: 753858
Implantable Lead Location: 753859
Implantable Lead Location: 753860
Implantable Lead Model: 4193
Implantable Lead Model: 5076
Implantable Lead Model: 6947
Implantable Pulse Generator Implant Date: 20170531
Lead Channel Impedance Value: 304 Ohm
Lead Channel Impedance Value: 380 Ohm
Lead Channel Impedance Value: 4047 Ohm
Lead Channel Impedance Value: 4047 Ohm
Lead Channel Impedance Value: 437 Ohm
Lead Channel Impedance Value: 456 Ohm
Lead Channel Pacing Threshold Amplitude: 0.75 V
Lead Channel Pacing Threshold Amplitude: 0.875 V
Lead Channel Pacing Threshold Amplitude: 1.75 V
Lead Channel Pacing Threshold Pulse Width: 0.4 ms
Lead Channel Pacing Threshold Pulse Width: 0.4 ms
Lead Channel Pacing Threshold Pulse Width: 0.4 ms
Lead Channel Sensing Intrinsic Amplitude: 25.5 mV
Lead Channel Sensing Intrinsic Amplitude: 25.5 mV
Lead Channel Sensing Intrinsic Amplitude: 3.375 mV
Lead Channel Sensing Intrinsic Amplitude: 3.375 mV
Lead Channel Setting Pacing Amplitude: 1.5 V
Lead Channel Setting Pacing Amplitude: 2.25 V
Lead Channel Setting Pacing Amplitude: 2.5 V
Lead Channel Setting Pacing Pulse Width: 0.4 ms
Lead Channel Setting Pacing Pulse Width: 0.4 ms
Lead Channel Setting Sensing Sensitivity: 0.3 mV

## 2019-02-14 ENCOUNTER — Encounter: Payer: Self-pay | Admitting: Cardiology

## 2019-02-14 NOTE — Progress Notes (Signed)
Remote ICD transmission.   

## 2019-02-17 ENCOUNTER — Ambulatory Visit (INDEPENDENT_AMBULATORY_CARE_PROVIDER_SITE_OTHER): Payer: Medicare Other

## 2019-02-17 DIAGNOSIS — Z95810 Presence of automatic (implantable) cardiac defibrillator: Secondary | ICD-10-CM

## 2019-02-17 DIAGNOSIS — I5032 Chronic diastolic (congestive) heart failure: Secondary | ICD-10-CM

## 2019-02-19 NOTE — Progress Notes (Signed)
EPIC Encounter for ICM Monitoring  Patient Name: Morgan Clay is a 73 y.o. female Date: 02/19/2019 Primary Care Physican: Billie Ruddy, MD Primary Cardiologist:[place] Electrophysiologist: Caryl Comes Nephrologist: [name] Bi-V Pacing:99.7% 02/19/2019 Weight: 137-140   Since 04-Feb-2019   AT/AF            3 episodes  Time in AT/AF     0.2 hr/day (0.9%)  Longest AT/AF     2 hours   Spoke with patient. She said she is feeling good at this time.  She does not have much urine output after the evening Torsemide dosage.  Torsemide dosage adjusted at 01/02/2019 office visit.    Fluid and salt intake have been consistent since Torsemide adjustment. Weight is stable.  Optivol thoracic impedance suggesting possible dryness since 01/02/2019.Denies symptoms that could be related to dryness.  Prescribed:Torsemide20 mgTake 3 tablets (60 mg total) every AM and 2 tablets (40 mg) every evening.  Potassium 20 mEq take 2 tablets (40 mEq total) by mouth daily.  Labs: 01/02/2019 Creatinine 1.91, BUN 37, Potassium 4.2, Sodium 136, GFR 26-30 12/25/2018 Creatinine 1.98, BUN 38, Potassium 3.8, Sodium 140 11/27/2018 Creatinine 1.57, BUN 35, Potassium 3.9, Sodium 139, GFR 39.31 07/19/2018 Creatinine 1.80, BUN 37, Potassium 4.5, Sodium 138, GFR 28-32 07/01/2018 Creatinine2.00, BUN45, Potassium4.1, Sodium138 A complete set of results can be found in Results Review.  Recommendations:  Encouraged to call if experiencing fluid symptoms.  Follow-up plan: ICM clinic phone appointment on 03/03/2019 to recheck fluid levels.   91 day device clinic remote transmission 05/06/2019.     Copy of ICM check sent to Dr. Caryl Comes and Dr [name] for review and recommendations if needed.   3 month ICM trend: 02/17/2019    1 Year ICM trend:       [name], RN 02/19/2019 1:17 PM

## 2019-02-20 NOTE — Progress Notes (Signed)
She can decrease torsemide to 40 mg bid.

## 2019-02-21 NOTE — Progress Notes (Signed)
Attempted patient call and left message to return call.  

## 2019-02-21 NOTE — Progress Notes (Signed)
Attempted patient call and left message that will call back later.

## 2019-02-21 NOTE — Progress Notes (Signed)
Attempted call to patient to provide Dr Claris Gladden recommendations and no answer.

## 2019-02-24 NOTE — Progress Notes (Signed)
Attempted call to patient on home and cell number to provide Dr Claris Gladden recommendations and no answer.

## 2019-03-03 ENCOUNTER — Ambulatory Visit (INDEPENDENT_AMBULATORY_CARE_PROVIDER_SITE_OTHER): Payer: Medicare Other

## 2019-03-03 DIAGNOSIS — I5032 Chronic diastolic (congestive) heart failure: Secondary | ICD-10-CM

## 2019-03-03 DIAGNOSIS — Z95810 Presence of automatic (implantable) cardiac defibrillator: Secondary | ICD-10-CM

## 2019-03-03 NOTE — Progress Notes (Signed)
Unable to reach patient to provide Dr Claris Gladden recommendations.

## 2019-03-03 NOTE — Progress Notes (Signed)
reviewed

## 2019-03-05 ENCOUNTER — Telehealth: Payer: Self-pay

## 2019-03-05 NOTE — Telephone Encounter (Signed)
Remote ICM transmission received.  Attempted call to patient regarding ICM remote transmission and no answer.  

## 2019-03-05 NOTE — Progress Notes (Signed)
EPIC Encounter for ICM Monitoring  Patient Name: Morgan Clay is a 73 y.o. female Date: 03/05/2019 Primary Care Physican: Billie Ruddy, MD Primary Cardiologist:[place] Electrophysiologist: Caryl Comes Nephrologist: [name] Bi-V Pacing:99.8% 02/19/2019 Weight: 137-140   Clinical Status (17-Feb-2019 to 03-Mar-2019)   AT/AF             44 episodes  Time in AT/AF     0.3 hr/day (1.2%)  Longest AT/AF     2 hours   Attempted call to patient and unable to reach.   Transmission reviewed.   Unable to reach patient to provide Dr Claris Gladden 10/15 recommendation to decrease Torsemide to 40 mg bid.   Optivol thoracic impedance returned to normal since 10/12 remote transmission.  Prescribed:Torsemide20 mgTake 3 tablets (60 mg total) every AM and 2 tablets (40 mg) every evening.  Potassium 20 mEq take 2 tablets (40 mEq total) by mouth daily.  Labs: 01/02/2019 Creatinine 1.91, BUN 37, Potassium 4.2, Sodium 136, GFR 26-30 12/25/2018 Creatinine 1.98, BUN 38, Potassium 3.8, Sodium 140 11/27/2018 Creatinine 1.57, BUN 35, Potassium 3.9, Sodium 139, GFR 39.31 07/19/2018 Creatinine 1.80, BUN 37, Potassium 4.5, Sodium 138, GFR 28-32 07/01/2018 Creatinine2.00, BUN45, Potassium4.1, Sodium138 A complete set of results can be found in Results Review.  Recommendations: Unable to reach.  Patient's Torsemide dosage continues to be 60 mg AM and 40 mg PM and fluid levels have returned to normal on this dosage.  Follow-up plan: ICM clinic phone appointment on 04/14/2019.   91 day device clinic remote transmission 05/06/2019.    Copy of ICM check sent to Dr. Caryl Comes and Dr [name] as Juluis Rainier that unable to reach patient to provide 10/15 recommendation.   3 month ICM trend: 03/03/2019    1 Year ICM trend:       [name], RN 03/05/2019 12:17 PM

## 2019-03-16 ENCOUNTER — Other Ambulatory Visit: Payer: Self-pay | Admitting: Family Medicine

## 2019-03-24 ENCOUNTER — Telehealth (INDEPENDENT_AMBULATORY_CARE_PROVIDER_SITE_OTHER): Payer: Medicare Other | Admitting: Family Medicine

## 2019-03-24 ENCOUNTER — Other Ambulatory Visit: Payer: Self-pay

## 2019-03-24 DIAGNOSIS — J069 Acute upper respiratory infection, unspecified: Secondary | ICD-10-CM

## 2019-03-24 MED ORDER — BENZONATATE 100 MG PO CAPS: 100.0000 mg | ORAL_CAPSULE | Freq: Three times a day (TID) | ORAL | 0 refills | Status: DC | PRN

## 2019-03-24 NOTE — Progress Notes (Signed)
Virtual Visit via Video Note  I connected with Morgan Clay on 03/24/19 at  9:00 AM EST by a video enabled telemedicine application 2/2 [id number] pandemic and verified that I am speaking with the correct person using two identifiers.  Location patient: home Location provider:work or home office Persons participating in the virtual visit: patient, provider  I discussed the limitations of evaluation and management by telemedicine and the availability of in person appointments. The patient expressed understanding and agreed to proceed.   HPI: Pt states last wk she developed "allergy symptoms"-watery eyes, nasal congestion, irritated throat, now feeling slightly better but with productive cough.  Pt took mucinex  Denies fever, HA, ear pain/pressure, diarrhea, decreased appetite, loss of taste or smell, sick contacts. Pt denies h/o COPD, states "they were supposed to take that out of my chart", "I was told I didn't have that".     ROS: See pertinent positives and negatives per HPI.  Past Medical History:  Diagnosis Date  . A-fib ([place])    on Eliquis  . AICD (automatic cardioverter/defibrillator) present 10/06/2015  . Anemia   . ARF (acute renal failure) ([place])   . Arthritis    "qwhere" (01/03/2018)  . Asthma    reports mild asthma since childhood - had COPD on dx list from prior PCP  . Chronic bronchitis ([place])    "get it most years; not this past year though" (01/03/2018)  . Chronic kidney disease    "? stage;  followed by [facility]; [name]" (01/03/2018)  . Chronic systolic congestive heart failure ([place])   . DEPRESSION 12/17/2009   Annotation: PHQ-9 score = 14 done on 12/17/2009 Qualifier: Diagnosis of  By: Hassell Done FNP, Tori Milks    . Diabetes mellitus without complication (HCC)    DIET CONTROLLED   . Diverticulosis   . Enteritis   . FIBROIDS, UTERUS 03/05/2008  . GI bleeding 01/03/2018  . Glaucoma   . Gout   . Hepatitis C    HEPATITIS C - s/p treatment with Harvoni, saw  hepatology, Dawn Drazek  . History of blood transfusion ~ 11/2017  . Hyperlipemia 12/06/2012  . HYPERTENSION, BENIGN 04/24/2007  . Hypothyroidism   . LBBB (left bundle branch block)   . Lower GI bleeding    "been dealing w/it since 07/2017" (01/03/2018)  . Nonischemic cardiomyopathy ([place])   . OBESITY 05/27/2009  . OBSTRUCTIVE SLEEP APNEA 11/14/2007   no CPAP  . OSTEOPENIA 09/30/2008  . Pain and swelling of left upper extremity 08/08/2017  . Personal history of other infectious and parasitic disease    Hepatitis B  . Pneumonia    "several times" (01/03/2018)  . Pulmonary edema, acute ([place]) 08/09/2017    Past Surgical History:  Procedure Laterality Date  . APPENDECTOMY    . CARDIAC CATHETERIZATION    . CARDIOVERSION N/A 12/14/2014   Procedure: CARDIOVERSION;  Surgeon: Dorothy Spark, MD;  Location: [facility] ENDOSCOPY;  Service: Cardiovascular;  Laterality: N/A;  . CARDIOVERSION N/A 02/26/2017   Procedure: CARDIOVERSION;  Surgeon: Evans Lance, MD;  Location: [place] CV LAB;  Service: Cardiovascular;  Laterality: N/A;  . CARDIOVERSION N/A 07/24/2017   Procedure: CARDIOVERSION;  Surgeon: Thayer Headings, MD;  Location: [facility] ENDOSCOPY;  Service: Cardiovascular;  Laterality: N/A;  . COLONOSCOPY N/A 11/08/2017   Procedure: COLONOSCOPY;  Surgeon: Milus Banister, MD;  Location: [facility] ENDOSCOPY;  Service: Endoscopy;  Laterality: N/A;  . DILATION AND CURETTAGE OF UTERUS    . EP IMPLANTABLE DEVICE N/A 10/06/2015  Procedure: BIV ICD Generator Changeout;  Surgeon: [name], MD;  Location: [place] CV LAB;  Service: Cardiovascular;  Laterality: N/A;  . ESOPHAGOGASTRODUODENOSCOPY N/A 10/26/2017   Procedure: ESOPHAGOGASTRODUODENOSCOPY (EGD);  Surgeon: Ladene Artist, MD;  Location: [facility] ENDOSCOPY;  Service: Endoscopy;  Laterality: N/A;  . ESOPHAGOGASTRODUODENOSCOPY (EGD) WITH PROPOFOL N/A 11/07/2017   Procedure: ESOPHAGOGASTRODUODENOSCOPY (EGD) WITH PROPOFOL;  Surgeon: Milus Banister, MD;  Location:  [facility] ENDOSCOPY;  Service: Endoscopy;  Laterality: N/A;  . HOT HEMOSTASIS N/A 10/26/2017   Procedure: HOT HEMOSTASIS (ARGON PLASMA COAGULATION/BICAP);  Surgeon: Ladene Artist, MD;  Location: [facility] ENDOSCOPY;  Service: Endoscopy;  Laterality: N/A;  . INSERT / REPLACE / REMOVE PACEMAKER  ?2008  . POLYPECTOMY  11/08/2017   Procedure: POLYPECTOMY;  Surgeon: Milus Banister, MD;  Location: [facility] ENDOSCOPY;  Service: Endoscopy;;  . PROCTOSCOPY N/A 05/22/2018   Procedure: RIGID PROCTOSCOPY;  Surgeon: Michael Boston, MD;  Location: [facility] ORS;  Service: General;  Laterality: N/A;  . RIGHT HEART CATH N/A 02/13/2018   Procedure: RIGHT HEART CATH;  Surgeon: [name], MD;  Location: [place] CV LAB;  Service: Cardiovascular;  Laterality: N/A;  . TUBAL LIGATION    . XI ROBOTIC ASSISTED LOWER ANTERIOR RESECTION N/A 05/22/2018   Procedure: XI ROBOTIC ASSISTED SIGMOID COLOECTOMY MOBILIZATION OF SPLENIC FLEXURE, FIREFLY ASSESSMENT OF PERFUSION;  Surgeon: Michael Boston, MD;  Location: [facility] ORS;  Service: General;  Laterality: N/A;  ERAS PATHWAY    Family History  Problem Relation Age of Onset  . Asthma Father   . Heart attack Father   . Asthma Sister   . Lung cancer Sister   . Heart attack Mother   . Stroke Brother   . Colon cancer Neg Hx   . Esophageal cancer Neg Hx   . Pancreatic cancer Neg Hx   . Stomach cancer Neg Hx   . Liver disease Neg Hx      Current Outpatient Medications:  .  albuterol (PROVENTIL HFA;VENTOLIN HFA) 108 (90 Base) MCG/ACT inhaler, Inhale 2 puffs into the lungs every 6 (six) hours as needed for wheezing or shortness of breath., Disp: 1 Inhaler, Rfl: 3 .  allopurinol (ZYLOPRIM) 300 MG tablet, Take 300 mg by mouth daily., Disp: , Rfl:  .  apixaban (ELIQUIS) 5 MG TABS tablet, Take 1 tablet (5 mg total) by mouth 2 (two) times daily., Disp: 60 tablet, Rfl: 11 .  BIDIL 20-37.5 MG tablet, Take 1 tablet by mouth twice daily, Disp: 180 tablet, Rfl: 0 .  carvedilol (COREG) 25 MG tablet, Take  1 tablet by mouth twice daily, Disp: 180 tablet, Rfl: 3 .  colchicine 0.6 MG tablet, Take 0.6 mg by mouth daily as needed (gout flares). , Disp: , Rfl: 0 .  EUTHYROX 137 MCG tablet, TAKE 1 TABLET BY MOUTH ONCE DAILY BEFORE BREAKFAST, Disp: 30 tablet, Rfl: 2 .  gabapentin (NEURONTIN) 100 MG capsule, Take 1 capsule (100 mg total) by mouth 3 (three) times daily., Disp: 90 capsule, Rfl: 0 .  nitroGLYCERIN (NITROSTAT) 0.4 MG SL tablet, Place 0.4 mg under the tongue every 5 (five) minutes as needed for chest pain., Disp: , Rfl:  .  pantoprazole (PROTONIX) 40 MG tablet, Take 1 tablet (40 mg total) by mouth daily., Disp: 30 tablet, Rfl: 4 .  Potassium Chloride ER 20 MEQ TBCR, Take 40 mEq by mouth daily., Disp: 180 tablet, Rfl: 1 .  sertraline (ZOLOFT) 50 MG tablet, Take 1 tablet (50 mg total) by mouth daily., Disp: 30  tablet, Rfl: 3 .  torsemide (DEMADEX) 20 MG tablet, Take 3 tablets (60 mg total) by mouth every morning AND 2 tablets (40 mg total) every evening., Disp: 150 tablet, Rfl: 1 .  traMADol (ULTRAM) 50 MG tablet, Take 1-2 tablets (50-100 mg total) by mouth every 6 (six) hours as needed for moderate pain or severe pain (mild pain)., Disp: 20 tablet, Rfl: 0 .  traZODone (DESYREL) 50 MG tablet, TAKE 1 TABLET BY MOUTH AT BEDTIME, Disp: 90 tablet, Rfl: 0 .  Vitamin D, Ergocalciferol, (DRISDOL) 1.25 MG (50000 UT) CAPS capsule, Take 1 capsule (50,000 Units total) by mouth every 7 (seven) days., Disp: 12 capsule, Rfl: 0 .  metolazone (ZAROXOLYN) 2.5 MG tablet, Take 1 tablet (2.5 mg total) by mouth as directed for 1 day. Then as directed by the CHF clinic, Disp: 5 tablet, Rfl: 3  EXAM:  VITALS per patient if applicable: RR between 48-01 bpm  GENERAL: alert, oriented, appears well and in no acute distress  HEENT: atraumatic, conjunctiva clear, no obvious abnormalities on inspection of external nose and ears  NECK: normal movements of the head and neck  LUNGS: productive sounding cough, on inspection  no signs of respiratory distress, breathing rate appears normal, no obvious gross SOB, gasping or wheezing  CV: no obvious cyanosis  MS: moves all visible extremities without noticeable abnormality  PSYCH/NEURO: pleasant and cooperative, no obvious depression or anxiety, speech and thought processing grossly intact  ASSESSMENT AND PLAN:  Discussed the following assessment and plan:  Viral URI with cough  -supportive care: tylenol prn, saline nasal spray or flonase prn, mucinex, drinking warm fluids, gargling with warm salt water or chloraseptic spray. -will continue to monitor.  Past CXR results reviewed, no recent mention of COPD. -Given precautions - Plan: benzonatate (TESSALON) 100 MG capsule  F/u prn   I discussed the assessment and treatment plan with the patient. The patient was provided an opportunity to ask questions and all were answered. The patient agreed with the plan and demonstrated an understanding of the instructions.   The patient was advised to call back or seek an in-person evaluation if the symptoms worsen or if the condition fails to improve as anticipated.    Billie Ruddy, MD

## 2019-04-09 DIAGNOSIS — N183 Chronic kidney disease, stage 3 unspecified: Secondary | ICD-10-CM | POA: Diagnosis not present

## 2019-04-09 DIAGNOSIS — M109 Gout, unspecified: Secondary | ICD-10-CM | POA: Diagnosis not present

## 2019-04-09 DIAGNOSIS — N189 Chronic kidney disease, unspecified: Secondary | ICD-10-CM | POA: Diagnosis not present

## 2019-04-09 DIAGNOSIS — R809 Proteinuria, unspecified: Secondary | ICD-10-CM | POA: Diagnosis not present

## 2019-04-09 DIAGNOSIS — D631 Anemia in chronic kidney disease: Secondary | ICD-10-CM | POA: Diagnosis not present

## 2019-04-09 DIAGNOSIS — I129 Hypertensive chronic kidney disease with stage 1 through stage 4 chronic kidney disease, or unspecified chronic kidney disease: Secondary | ICD-10-CM | POA: Diagnosis not present

## 2019-04-14 ENCOUNTER — Ambulatory Visit (INDEPENDENT_AMBULATORY_CARE_PROVIDER_SITE_OTHER): Payer: Medicare Other

## 2019-04-14 DIAGNOSIS — I5032 Chronic diastolic (congestive) heart failure: Secondary | ICD-10-CM | POA: Diagnosis not present

## 2019-04-14 DIAGNOSIS — Z95810 Presence of automatic (implantable) cardiac defibrillator: Secondary | ICD-10-CM | POA: Diagnosis not present

## 2019-04-15 ENCOUNTER — Telehealth: Payer: Self-pay

## 2019-04-15 NOTE — Progress Notes (Signed)
EPIC Encounter for ICM Monitoring  Patient Name: Morgan Clay is a 73 y.o. female Date: 04/15/2019 Primary Care Physican: Billie Ruddy, MD Primary Cardiologist:[place] Electrophysiologist: Caryl Comes Nephrologist: [name] Bi-V Pacing:99.8% 02/19/2019 Weight: 137-140  Clinical Status (03-Mar-2019 to 14-Apr-2019)   AT/AF 10 episodes  Time in AT/AF 0.3 hr/day (1.3%)  Longest AT/AF 8 hours   Attempted call to patient and unable to reach.   Transmission reviewed.     Optivol thoracic impedancesuggesting possible fluid accumulation from 03/03/2019 until day of transmission 04/14/2019.  Prescribed:Torsemide20 mgTake3tablets (60 mg total)every AM and 2 tablets (40 mg) every evening. Potassium 20 mEq take 2 tablets (40 mEq total) by mouth daily.  Labs: 01/02/2019 Creatinine 1.91, BUN 37, Potassium 4.2, Sodium 136, GFR 26-30 12/25/2018 Creatinine 1.98, BUN 38, Potassium 3.8, Sodium 140 11/27/2018 Creatinine 1.57, BUN 35, Potassium 3.9, Sodium 139, GFR 39.31 07/19/2018 Creatinine 1.80, BUN 37, Potassium 4.5, Sodium 138, GFR 28-32 07/01/2018 Creatinine2.00, BUN45, Potassium4.1, Sodium138 A complete set of results can be found in Results Review.  Recommendations: Left voice mail with ICM number and encouraged to call if experiencing any fluid symptoms.  Follow-up plan: ICM clinic phone appointment on 05/19/2019.   91 day device clinic remote transmission 05/06/2019.    Copy of ICM check sent to Dr. Caryl Comes.   3 month ICM trend: 04/14/2019    1 Year ICM trend:       [name], RN 04/15/2019 11:07 AM

## 2019-04-15 NOTE — Telephone Encounter (Signed)
Remote ICM transmission received.  Attempted call to patient regarding ICM remote transmission and left detailed message per DPRl.  Advised to return call for any fluid symptoms or questions.

## 2019-05-06 ENCOUNTER — Ambulatory Visit (INDEPENDENT_AMBULATORY_CARE_PROVIDER_SITE_OTHER): Payer: Medicare Other | Admitting: *Deleted

## 2019-05-06 DIAGNOSIS — I428 Other cardiomyopathies: Secondary | ICD-10-CM | POA: Diagnosis not present

## 2019-05-06 LAB — CUP PACEART REMOTE DEVICE CHECK
Battery Remaining Longevity: 38 mo
Battery Voltage: 2.96 V
Brady Statistic AP VP Percent: 7.46 %
Brady Statistic AP VS Percent: 0.01 %
Brady Statistic AS VP Percent: 92.53 %
Brady Statistic AS VS Percent: 0 %
Brady Statistic RA Percent Paced: 7.47 %
Brady Statistic RV Percent Paced: 99.93 %
Date Time Interrogation Session: 20201229022723
HighPow Impedance: 43 Ohm
HighPow Impedance: 67 Ohm
Implantable Lead Implant Date: 20080818
Implantable Lead Implant Date: 20080818
Implantable Lead Implant Date: 20080818
Implantable Lead Location: 753858
Implantable Lead Location: 753859
Implantable Lead Location: 753860
Implantable Lead Model: 4193
Implantable Lead Model: 5076
Implantable Lead Model: 6947
Implantable Pulse Generator Implant Date: 20170531
Lead Channel Impedance Value: 266 Ohm
Lead Channel Impedance Value: 323 Ohm
Lead Channel Impedance Value: 380 Ohm
Lead Channel Impedance Value: 399 Ohm
Lead Channel Impedance Value: 4047 Ohm
Lead Channel Impedance Value: 4047 Ohm
Lead Channel Pacing Threshold Amplitude: 0.875 V
Lead Channel Pacing Threshold Amplitude: 1.125 V
Lead Channel Pacing Threshold Amplitude: 1.75 V
Lead Channel Pacing Threshold Pulse Width: 0.4 ms
Lead Channel Pacing Threshold Pulse Width: 0.4 ms
Lead Channel Pacing Threshold Pulse Width: 0.4 ms
Lead Channel Sensing Intrinsic Amplitude: 18.375 mV
Lead Channel Sensing Intrinsic Amplitude: 18.375 mV
Lead Channel Sensing Intrinsic Amplitude: 2.625 mV
Lead Channel Sensing Intrinsic Amplitude: 2.625 mV
Lead Channel Setting Pacing Amplitude: 1.75 V
Lead Channel Setting Pacing Amplitude: 2.25 V
Lead Channel Setting Pacing Amplitude: 2.5 V
Lead Channel Setting Pacing Pulse Width: 0.4 ms
Lead Channel Setting Pacing Pulse Width: 0.4 ms
Lead Channel Setting Sensing Sensitivity: 0.3 mV

## 2019-05-15 ENCOUNTER — Other Ambulatory Visit (HOSPITAL_COMMUNITY): Payer: Self-pay | Admitting: Internal Medicine

## 2019-05-19 ENCOUNTER — Ambulatory Visit (INDEPENDENT_AMBULATORY_CARE_PROVIDER_SITE_OTHER): Payer: Medicare Other

## 2019-05-19 DIAGNOSIS — I5032 Chronic diastolic (congestive) heart failure: Secondary | ICD-10-CM | POA: Diagnosis not present

## 2019-05-19 DIAGNOSIS — Z95810 Presence of automatic (implantable) cardiac defibrillator: Secondary | ICD-10-CM | POA: Diagnosis not present

## 2019-05-21 ENCOUNTER — Telehealth: Payer: Self-pay

## 2019-05-21 NOTE — Progress Notes (Signed)
EPIC Encounter for ICM Monitoring  Patient Name: Morgan Clay is a 74 y.o. female Date: 05/21/2019 Primary Care Physican: Billie Ruddy, MD Primary Cardiologist:[place] Electrophysiologist: Caryl Comes Nephrologist: [name] Bi-V Pacing:99.8% Last Weight: 137-140    Time in AT/AF  13.5 hr/day (56.2%)  Longest AT/AF  5 days  Attempted call to patient and unable to reach. Transmission reviewed.  Optivol thoracic impedancesuggesting possible ongoing fluid accumulation since 05/01/2019 but trending back toward baseline.  Prescribed:Torsemide20 mgTake3tablets (60 mg total)every AM and 2 tablets (40 mg) every evening. Potassium 20 mEq take 2 tablets (40 mEq total) by mouth daily.  Labs: 01/02/2019 Creatinine 1.91, BUN 37, Potassium 4.2, Sodium 136, GFR 26-30 12/25/2018 Creatinine 1.98, BUN 38, Potassium 3.8, Sodium 140 11/27/2018 Creatinine 1.57, BUN 35, Potassium 3.9, Sodium 139, GFR 39.31 07/19/2018 Creatinine 1.80, BUN 37, Potassium 4.5, Sodium 138, GFR 28-32 07/01/2018 Creatinine2.00, BUN45, Potassium4.1, Sodium138 A complete set of results can be found in Results Review.  Recommendations: Left voice mail with ICM number and encouraged to call if experiencing any fluid symptoms.  Follow-up plan: ICM clinic phone appointment on 06/23/2019.   91 day device clinic remote transmission 08/05/2019.  Office appt 07/24/2019 with Dr. Caryl Comes.    Patient over due to schedule appointment with Dr [name].  Copy of ICM check sent to Dr. Caryl Comes.   3 month ICM trend: 05/19/2019    1 Year ICM trend:       [name], RN 05/21/2019 11:52 AM

## 2019-05-21 NOTE — Telephone Encounter (Signed)
Remote ICM transmission received.  Attempted call to patient regarding ICM remote transmission and left detailed message per DPR.   

## 2019-06-18 ENCOUNTER — Other Ambulatory Visit: Payer: Self-pay

## 2019-06-18 DIAGNOSIS — N189 Chronic kidney disease, unspecified: Secondary | ICD-10-CM

## 2019-06-18 DIAGNOSIS — D631 Anemia in chronic kidney disease: Secondary | ICD-10-CM

## 2019-06-19 ENCOUNTER — Inpatient Hospital Stay: Payer: Medicare Other

## 2019-06-19 ENCOUNTER — Telehealth: Payer: Self-pay | Admitting: Oncology

## 2019-06-19 ENCOUNTER — Inpatient Hospital Stay: Payer: Medicare Other | Attending: Oncology | Admitting: Oncology

## 2019-06-19 ENCOUNTER — Other Ambulatory Visit: Payer: Self-pay

## 2019-06-19 VITALS — BP 136/72 | HR 64 | Temp 98.3°F | Resp 18 | Ht 59.0 in | Wt 150.2 lb

## 2019-06-19 DIAGNOSIS — N189 Chronic kidney disease, unspecified: Secondary | ICD-10-CM

## 2019-06-19 DIAGNOSIS — D508 Other iron deficiency anemias: Secondary | ICD-10-CM | POA: Diagnosis not present

## 2019-06-19 DIAGNOSIS — Z79899 Other long term (current) drug therapy: Secondary | ICD-10-CM | POA: Insufficient documentation

## 2019-06-19 DIAGNOSIS — Z7901 Long term (current) use of anticoagulants: Secondary | ICD-10-CM | POA: Diagnosis not present

## 2019-06-19 DIAGNOSIS — N289 Disorder of kidney and ureter, unspecified: Secondary | ICD-10-CM | POA: Insufficient documentation

## 2019-06-19 DIAGNOSIS — D631 Anemia in chronic kidney disease: Secondary | ICD-10-CM

## 2019-06-19 LAB — CBC WITH DIFFERENTIAL (CANCER CENTER ONLY)
Abs Immature Granulocytes: 0.01 10*3/uL (ref 0.00–0.07)
Basophils Absolute: 0 10*3/uL (ref 0.0–0.1)
Basophils Relative: 0 %
Eosinophils Absolute: 0.2 10*3/uL (ref 0.0–0.5)
Eosinophils Relative: 3 %
HCT: 34.2 % — ABNORMAL LOW (ref 36.0–46.0)
Hemoglobin: 11 g/dL — ABNORMAL LOW (ref 12.0–15.0)
Immature Granulocytes: 0 %
Lymphocytes Relative: 24 %
Lymphs Abs: 1.4 10*3/uL (ref 0.7–4.0)
MCH: 28.6 pg (ref 26.0–34.0)
MCHC: 32.2 g/dL (ref 30.0–36.0)
MCV: 89.1 fL (ref 80.0–100.0)
Monocytes Absolute: 0.6 10*3/uL (ref 0.1–1.0)
Monocytes Relative: 10 %
Neutro Abs: 3.6 10*3/uL (ref 1.7–7.7)
Neutrophils Relative %: 63 %
Platelet Count: 192 10*3/uL (ref 150–400)
RBC: 3.84 MIL/uL — ABNORMAL LOW (ref 3.87–5.11)
RDW: 14.2 % (ref 11.5–15.5)
WBC Count: 5.8 10*3/uL (ref 4.0–10.5)
nRBC: 0 % (ref 0.0–0.2)

## 2019-06-19 LAB — IRON AND TIBC
Iron: 48 ug/dL (ref 41–142)
Saturation Ratios: 11 % — ABNORMAL LOW (ref 21–57)
TIBC: 446 ug/dL — ABNORMAL HIGH (ref 236–444)
UIBC: 399 ug/dL — ABNORMAL HIGH (ref 120–384)

## 2019-06-19 NOTE — Telephone Encounter (Signed)
No los per 2/11.

## 2019-06-19 NOTE — Progress Notes (Signed)
Hematology and Oncology Follow Up Visit  Morgan Clay [id number] Apr 09, 1946 74 y.o. 06/19/2019 10:18 AM Morgan Clay, MDBanks, Morgan Adie, MD   Principle Diagnosis: 74 year old woman with iron deficiency anemia diagnosed in July 2019.  She has element of chronic disease, renal insufficiency in addition to iron deficiency.    Prior Therapy: Feraheme infusion last infusion given in September 2019.  Current therapy: Active surveillance.  History of present illness: Morgan Clay presents today for a follow-up visit.  Since the last visit, she reports no major changes in her health.  She continues to feel well without any recent hospitalization or illnesses.  She denies any excessive fatigue tiredness or decline in her performance status.  She denies any hematochezia or melena.    Medications: Unchanged on review. Current Outpatient Medications  Medication Sig Dispense Refill  . albuterol (PROVENTIL HFA;VENTOLIN HFA) 108 (90 Base) MCG/ACT inhaler Inhale 2 puffs into the lungs every 6 (six) hours as needed for wheezing or shortness of breath. 1 Inhaler 3  . allopurinol (ZYLOPRIM) 300 MG tablet Take 300 mg by mouth daily.    [name] apixaban (ELIQUIS) 5 MG TABS tablet Take 1 tablet (5 mg total) by mouth 2 (two) times daily. 60 tablet 11  . benzonatate (TESSALON) 100 MG capsule Take 1 capsule (100 mg total) by mouth 3 (three) times daily as needed for cough. 30 capsule 0  . BIDIL 20-37.5 MG tablet Take 1 tablet by mouth twice daily 180 tablet 0  . carvedilol (COREG) 25 MG tablet Take 1 tablet by mouth twice daily 180 tablet 3  . colchicine 0.6 MG tablet Take 0.6 mg by mouth daily as needed (gout flares).   0  . EUTHYROX 137 MCG tablet TAKE 1 TABLET BY MOUTH ONCE DAILY BEFORE BREAKFAST 30 tablet 2  . gabapentin (NEURONTIN) 100 MG capsule Take 1 capsule (100 mg total) by mouth 3 (three) times daily. 90 capsule 0  . metolazone (ZAROXOLYN) 2.5 MG tablet Take 1 tablet (2.5 mg total) by mouth as  directed for 1 day. Then as directed by the CHF clinic 5 tablet 3  . nitroGLYCERIN (NITROSTAT) 0.4 MG SL tablet Place 0.4 mg under the tongue every 5 (five) minutes as needed for chest pain.    . pantoprazole (PROTONIX) 40 MG tablet Take 1 tablet (40 mg total) by mouth daily. 30 tablet 4  . Potassium Chloride ER 20 MEQ TBCR Take 40 mEq by mouth daily. 180 tablet 1  . sertraline (ZOLOFT) 50 MG tablet Take 1 tablet (50 mg total) by mouth daily. 30 tablet 3  . torsemide (DEMADEX) 20 MG tablet Take 3 tablets (60 mg total) by mouth every morning AND 2 tablets (40 mg total) every evening. 150 tablet 1  . traMADol (ULTRAM) 50 MG tablet Take 1-2 tablets (50-100 mg total) by mouth every 6 (six) hours as needed for moderate pain or severe pain (mild pain). 20 tablet 0  . traZODone (DESYREL) 50 MG tablet TAKE 1 TABLET BY MOUTH AT BEDTIME 90 tablet 0  . Vitamin D, Ergocalciferol, (DRISDOL) 1.25 MG (50000 UT) CAPS capsule Take 1 capsule (50,000 Units total) by mouth every 7 (seven) days. 12 capsule 0   No current facility-administered medications for this visit.     Allergies: No Known Allergies      Physical Exam: Blood pressure 136/72, pulse 64, temperature 98.3 F (36.8 C), temperature source Temporal, resp. rate 18, height 4\' 11"  (1.499 m), weight 150 lb 3.2 oz (68.1 kg), SpO2 100 %.  ECOG: 1   General appearance: Alert, awake without any distress. Head: Atraumatic without abnormalities Oropharynx: Without any thrush or ulcers. Eyes: No scleral icterus. Lymph nodes: No lymphadenopathy noted in the cervical, supraclavicular, or axillary nodes Heart:regular rate and rhythm, without any murmurs or gallops.   Lung: Clear to auscultation without any rhonchi, wheezes or dullness to percussion. Abdomin: Soft, nontender without any shifting dullness or ascites. Musculoskeletal: No clubbing or cyanosis. Neurological: No motor or sensory deficits. Skin: No rashes or lesions.     Lab  Results: Lab Results  Component Value Date   WBC 4.1 01/02/2019   HGB 12.9 01/02/2019   HCT 40.5 01/02/2019   MCV 92.0 01/02/2019   PLT 151 01/02/2019     Chemistry      Component Value Date/Time   NA 136 01/02/2019 1120   NA 140 12/25/2018 1514   K 4.2 01/02/2019 1120   CL 102 01/02/2019 1120   CO2 24 01/02/2019 1120   BUN 37 (H) 01/02/2019 1120   BUN 33 (H) 12/25/2018 1514   CREATININE 1.91 (H) 01/02/2019 1120   CREATININE 2.36 (H) 11/23/2017 1132   CREATININE 1.26 (H) 02/25/2016 1607   GLU 106 12/20/2016 0000      Component Value Date/Time   CALCIUM 9.4 01/02/2019 1120   ALKPHOS 70 03/05/2018 0555   AST 29 03/05/2018 0555   AST 14 (L) 11/23/2017 1132   ALT 23 03/05/2018 0555   ALT 9 11/23/2017 1132   BILITOT 1.1 03/05/2018 0555   BILITOT 0.3 11/23/2017 1132      Results for Morgan Clay (MRN [id number]) as of 06/19/2019 10:20  Ref. Range 12/12/2018 11:50  Iron Latest Ref Range: 41 - 142 ug/dL 46  UIBC Latest Ref Range: 120 - 384 ug/dL 328  TIBC Latest Ref Range: 236 - 444 ug/dL 374  Saturation Ratios Latest Ref Range: 21 - 57 % 12 (L)  Ferritin Latest Ref Range: 11 - 307 ng/mL 83     Impression and Plan:  74 year old woman with:  1.  Anemia diagnosed in 2019.  She has evidence of iron deficiency as well as anemia of renal disease.  She is status post intravenous iron infusion in the past with reasonable improvement in her iron studies and labs.  Her CBC from today continues to show hemoglobin around 11 with iron studies currently pending.  Iron studies in August 2020 showed slight decline but does not want any repeat intravenous iron.  For the time being I recommended continued active surveillance and repeat intravenous iron as needed.  Growth factor support will be used of her iron is completely repleted and hemoglobin dropping.  2.  Follow-up: She will follow-up in 6 months for repeat evaluation.  20  minutes was dedicated to this visit.  A time was  spent on reviewing laboratory data, discussing disease status and addressing future plan of care.    Zola Button, MD 2/11/202110:18 AM

## 2019-06-20 LAB — FERRITIN: Ferritin: 38 ng/mL (ref 11–307)

## 2019-06-23 ENCOUNTER — Ambulatory Visit (INDEPENDENT_AMBULATORY_CARE_PROVIDER_SITE_OTHER): Payer: Medicare Other

## 2019-06-23 ENCOUNTER — Other Ambulatory Visit (HOSPITAL_COMMUNITY): Payer: Self-pay | Admitting: Cardiology

## 2019-06-23 DIAGNOSIS — I5032 Chronic diastolic (congestive) heart failure: Secondary | ICD-10-CM

## 2019-06-23 DIAGNOSIS — Z95810 Presence of automatic (implantable) cardiac defibrillator: Secondary | ICD-10-CM

## 2019-06-26 DIAGNOSIS — K7469 Other cirrhosis of liver: Secondary | ICD-10-CM | POA: Diagnosis not present

## 2019-06-27 NOTE — Progress Notes (Signed)
EPIC Encounter for ICM Monitoring  Patient Name: Morgan Clay is a 74 y.o. female Date: 06/27/2019 Primary Care Physican: Billie Ruddy, MD Primary Cardiologist:[place] Electrophysiologist: Caryl Comes Nephrologist: [name] Bi-V Pacing:99.9% Last Weight: 137-140  Since 19-May-2019  AT/AF                          87 episodes  Time in AT/AF  <0.1 hr/day (0.2%) Longest AT/AF  2 minutes   Transmission reviewed.  Optivol thoracic impedancenormal.  Prescribed:Torsemide20 mgTake3tablets (60 mg total)every AM and 2 tablets (40 mg) every evening. Potassium 20 mEq take 2 tablets (40 mEq total) by mouth daily.  Labs: 01/02/2019 Creatinine 1.91, BUN 37, Potassium 4.2, Sodium 136, GFR 26-30 12/25/2018 Creatinine 1.98, BUN 38, Potassium 3.8, Sodium 140 11/27/2018 Creatinine 1.57, BUN 35, Potassium 3.9, Sodium 139, GFR 39.31 07/19/2018 Creatinine 1.80, BUN 37, Potassium 4.5, Sodium 138, GFR 28-32 07/01/2018 Creatinine2.00, BUN45, Potassium4.1, Sodium138 A complete set of results can be found in Results Review.  Recommendations: None  Follow-up plan: ICM clinic phone appointment on 08/06/2019.   91 day device clinic remote transmission 08/05/2019.  Office appt 07/24/2019 with Dr. Caryl Comes.    Patient over due to schedule appointment with Dr [name].  Copy of ICM check sent to Dr. Caryl Comes.   3 month ICM trend: 06/23/2019    1 Year ICM trend:       [name], RN 06/27/2019 2:27 PM

## 2019-06-30 DIAGNOSIS — K7469 Other cirrhosis of liver: Secondary | ICD-10-CM | POA: Diagnosis not present

## 2019-07-07 ENCOUNTER — Other Ambulatory Visit: Payer: Self-pay | Admitting: Family Medicine

## 2019-07-10 ENCOUNTER — Ambulatory Visit: Payer: Medicare Other | Attending: Internal Medicine

## 2019-07-10 DIAGNOSIS — Z23 Encounter for immunization: Secondary | ICD-10-CM | POA: Insufficient documentation

## 2019-07-10 NOTE — Progress Notes (Signed)
   Covid-19 Vaccination Clinic  Name:  Morgan Clay    MRN: [id number] DOB: 1945-10-21  07/10/2019  [name] was observed post Covid-19 immunization for 15 minutes without incident. She was provided with Vaccine Information Sheet and instruction to access the V-Safe system.   [name] was instructed to call 911 with any severe reactions post vaccine: [name] Difficulty breathing  . Swelling of face and throat  . A fast heartbeat  . A bad rash all over body  . Dizziness and weakness   Immunizations Administered    Name Date Dose VIS Date Route   Pfizer COVID-19 Vaccine 07/10/2019 10:40 AM 0.3 mL 04/18/2019 Intramuscular   Manufacturer: [place]   Lot: [id number]   [place]: [id number]

## 2019-07-14 ENCOUNTER — Telehealth: Payer: Self-pay | Admitting: Family Medicine

## 2019-07-14 NOTE — Telephone Encounter (Signed)
error 

## 2019-07-15 ENCOUNTER — Other Ambulatory Visit: Payer: Self-pay | Admitting: Nurse Practitioner

## 2019-07-16 ENCOUNTER — Other Ambulatory Visit: Payer: Self-pay

## 2019-07-17 ENCOUNTER — Ambulatory Visit (INDEPENDENT_AMBULATORY_CARE_PROVIDER_SITE_OTHER): Payer: Medicare Other | Admitting: Family Medicine

## 2019-07-17 ENCOUNTER — Encounter: Payer: Self-pay | Admitting: Family Medicine

## 2019-07-17 VITALS — BP 168/52 | HR 68 | Temp 98.4°F | Wt 153.0 lb

## 2019-07-17 DIAGNOSIS — E1122 Type 2 diabetes mellitus with diabetic chronic kidney disease: Secondary | ICD-10-CM | POA: Diagnosis not present

## 2019-07-17 DIAGNOSIS — N184 Chronic kidney disease, stage 4 (severe): Secondary | ICD-10-CM | POA: Diagnosis not present

## 2019-07-17 DIAGNOSIS — E038 Other specified hypothyroidism: Secondary | ICD-10-CM

## 2019-07-17 DIAGNOSIS — R0602 Shortness of breath: Secondary | ICD-10-CM | POA: Diagnosis not present

## 2019-07-17 DIAGNOSIS — R5383 Other fatigue: Secondary | ICD-10-CM

## 2019-07-17 LAB — VITAMIN D 25 HYDROXY (VIT D DEFICIENCY, FRACTURES): VITD: 29.57 ng/mL — ABNORMAL LOW (ref 30.00–100.00)

## 2019-07-17 LAB — BASIC METABOLIC PANEL
BUN: 34 mg/dL — ABNORMAL HIGH (ref 6–23)
CO2: 27 mEq/L (ref 19–32)
Calcium: 9.5 mg/dL (ref 8.4–10.5)
Chloride: 103 mEq/L (ref 96–112)
Creatinine, Ser: 1.82 mg/dL — ABNORMAL HIGH (ref 0.40–1.20)
GFR: 33.09 mL/min — ABNORMAL LOW (ref >60.00)
Glucose, Bld: 95 mg/dL (ref 70–99)
Potassium: 3.7 mEq/L (ref 3.5–5.1)
Sodium: 139 mEq/L (ref 135–145)

## 2019-07-17 LAB — CBC
HCT: 32.7 % — ABNORMAL LOW (ref 36.0–46.0)
Hemoglobin: 10.8 g/dL — ABNORMAL LOW (ref 12.0–15.0)
MCHC: 32.9 g/dL (ref 30.0–36.0)
MCV: 85.1 fl (ref 78.0–100.0)
Platelets: 204 10*3/uL (ref 150.0–400.0)
RBC: 3.84 Mil/uL — ABNORMAL LOW (ref 3.87–5.11)
RDW: 14.9 % (ref 11.5–15.5)
WBC: 5.9 10*3/uL (ref 4.0–10.5)

## 2019-07-17 LAB — TSH: TSH: 3.87 u[IU]/mL (ref 0.35–4.50)

## 2019-07-17 LAB — HEMOGLOBIN A1C: Hgb A1c MFr Bld: 6 % (ref 4.6–6.5)

## 2019-07-17 LAB — BRAIN NATRIURETIC PEPTIDE: Pro B Natriuretic peptide (BNP): 811 pg/mL — ABNORMAL HIGH (ref 0.0–100.0)

## 2019-07-17 MED ORDER — GABAPENTIN 300 MG PO CAPS: 300.0000 mg | ORAL_CAPSULE | Freq: Two times a day (BID) | ORAL | 2 refills | Status: DC

## 2019-07-17 NOTE — Progress Notes (Signed)
Subjective:    Patient ID: Morgan Clay, female    DOB: February 02, 1946, 74 y.o.   MRN: [id number]  No chief complaint on file.   HPI Pt is a 74 yo female with pmh sig for chronic diastolic CHF, NICM with ICD in place, atypical Aflutter, HTN, pulmonary HTN, OSA, COPD, h/o Hep C, Hypothyroidism, DM II with CKD 4, anemia, and gout who was seen for acute concern.    Pt endorses SOB and fatigue x 1 wk.  Pt notices the SOB with ambulation short distances or going up stairs.  States sleeps on several pillows at baseline.  Endorses a few lbs wt gain.  Monitored by remotely by Cardiology.  Next appt in a few wks.  Denies increased SOB at night, LE edema, cough, calf pain, insomnia, CP, fever, chills, n/v.  Pt states there is one on 4 things that could be wrong with her including her pacemaker battery dying.  Requesting refill on gabapentin.  Given by another provider for LE pain and numbness.  States 300 mg works better than the 100 mg tabs previously given.  Past Medical History:  Diagnosis Date  . A-fib ([place])    on Eliquis  . AICD (automatic cardioverter/defibrillator) present 10/06/2015  . Anemia   . ARF (acute renal failure) ([place])   . Arthritis    "qwhere" (01/03/2018)  . Asthma    reports mild asthma since childhood - had COPD on dx list from prior PCP  . Chronic bronchitis ([place])    "get it most years; not this past year though" (01/03/2018)  . Chronic kidney disease    "? stage;  followed by [facility]; [name]" (01/03/2018)  . Chronic systolic congestive heart failure ([place])   . DEPRESSION 12/17/2009   Annotation: PHQ-9 score = 14 done on 12/17/2009 Qualifier: Diagnosis of  By: Hassell Done FNP, Tori Milks    . Diabetes mellitus without complication (HCC)    DIET CONTROLLED   . Diverticulosis   . Enteritis   . FIBROIDS, UTERUS 03/05/2008  . GI bleeding 01/03/2018  . Glaucoma   . Gout   . Hepatitis C    HEPATITIS C - s/p treatment with Harvoni, saw hepatology, Dawn Drazek  . History of  blood transfusion ~ 11/2017  . Hyperlipemia 12/06/2012  . HYPERTENSION, BENIGN 04/24/2007  . Hypothyroidism   . LBBB (left bundle branch block)   . Lower GI bleeding    "been dealing w/it since 07/2017" (01/03/2018)  . Nonischemic cardiomyopathy ([place])   . OBESITY 05/27/2009  . OBSTRUCTIVE SLEEP APNEA 11/14/2007   no CPAP  . OSTEOPENIA 09/30/2008  . Pain and swelling of left upper extremity 08/08/2017  . Personal history of other infectious and parasitic disease    Hepatitis B  . Pneumonia    "several times" (01/03/2018)  . Pulmonary edema, acute ([place]) 08/09/2017    No Known Allergies  ROS General: Denies fever, chills, night sweats, changes in appetite  +fatigue, weigh gain HEENT: Denies headaches, ear pain, changes in vision, rhinorrhea, sore throat CV: Denies CP, palpitations, orthopnea  +SOB Pulm: Denies cough, wheezing  +SOB GI: Denies abdominal pain, nausea, vomiting, diarrhea, constipation GU: Denies dysuria, hematuria, frequency, vaginal discharge Msk: Denies muscle cramps, joint pains Neuro: Denies weakness, numbness, tingling Skin: Denies rashes, bruising Psych: Denies depression, anxiety, hallucinations     Objective:    Blood pressure (!) 168/52, pulse 68, temperature 98.4 F (36.9 C), temperature source Temporal, weight 153 lb (69.4 kg), SpO2 98 %.  Gen. Well-nourished, visibly  SOB while sitting and moving around the room, appears annoyed, short with provider.   HEENT: [place]/AT, face symmetric, conjunctiva pallor, no scleral icterus, PERRLA, EOMI, nares patent without drainage, TMs normal b/l Neck: No JVD, no thyromegaly, no carotid bruits Lungs: no accessory muscle use, CTAB, no wheezes or rales Cardiovascular: RRR, no m/r/g, no peripheral edema Neuro:  A&Ox3, CN II-XII intact, normal gait Skin:  Warm, no lesions/ rash   Wt Readings from Last 3 Encounters:  06/19/19 150 lb 3.2 oz (68.1 kg)  01/02/19 149 lb 6.4 oz (67.8 kg)  12/25/18 150 lb (68 kg)    Lab Results   Component Value Date   WBC 5.8 06/19/2019   HGB 11.0 (L) 06/19/2019   HCT 34.2 (L) 06/19/2019   PLT 192 06/19/2019   GLUCOSE 102 (H) 01/02/2019   CHOL 148 02/25/2016   TRIG 91 02/25/2016   HDL 82 02/25/2016   LDLCALC 48 02/25/2016   ALT 23 03/05/2018   AST 29 03/05/2018   NA 136 01/02/2019   K 4.2 01/02/2019   CL 102 01/02/2019   CREATININE 1.91 (H) 01/02/2019   BUN 37 (H) 01/02/2019   CO2 24 01/02/2019   TSH 8.200 (H) 01/02/2019   INR 1.26 02/08/2018   HGBA1C 6.1 11/27/2018   MICROALBUR 7.7 (H) 11/24/2013    Assessment/Plan:  SOB (shortness of breath)  -Discussed possible causes including CHF exacerbation, anemia of chronic dz., hypothyroidism, COPD exacerbation, etc -6 MWT advised.  Pt declined -CXR advised.  Pt declined -advised to contact Cardiology for acute f/u appt and pacer interrogation - Plan: TSH, CBC (no diff), Brain Natriuretic Peptide, Basic metabolic panel  Other specified hypothyroidism  -continue synthroid 137 mcg daily -will make adjustments if needed based on labs - Plan: TSH  Controlled type 2 diabetes mellitus with stage 4 chronic kidney disease, without long-term current use of insulin (HCC)  -last Hgb A1C was 6.1% on 11/27/18 -continue lifestyle modifications -gabapentin refilled - Plan: Basic metabolic panel, Hemoglobin A1c  Fatigue, unspecified type  - Plan: Vitamin D, 25-hydroxy  F/u prn  Grier Mitts, MD

## 2019-07-17 NOTE — Patient Instructions (Signed)
Shortness of Breath, Adult Shortness of breath is when a person has trouble breathing enough air or when a person feels like she or he is having trouble breathing in enough air. Shortness of breath could be a sign of a medical problem. Follow these instructions at home:   Pay attention to any changes in your symptoms.  Do not use any products that contain nicotine or tobacco, such as cigarettes, e-cigarettes, and chewing tobacco.  Do not smoke. Smoking is a common cause of shortness of breath. If you need help quitting, ask your health care provider.  Avoid things that can irritate your airways, such as: ? Mold. ? Dust. ? Air pollution. ? Chemical fumes. ? Things that can cause allergy symptoms (allergens), if you have allergies.  Keep your living space clean and free of mold and dust.  Rest as needed. Slowly return to your usual activities.  Take over-the-counter and prescription medicines only as told by your health care provider. This includes oxygen therapy and inhaled medicines.  Keep all follow-up visits as told by your health care provider. This is important. Contact a health care provider if:  Your condition does not improve as soon as expected.  You have a hard time doing your normal activities, even after you rest.  You have new symptoms. Get help right away if:  Your shortness of breath gets worse.  You have shortness of breath when you are resting.  You feel light-headed or you faint.  You have a cough that is not controlled with medicines.  You cough up blood.  You have pain with breathing.  You have pain in your chest, arms, shoulders, or abdomen.  You have a fever.  You cannot walk up stairs or exercise the way that you normally do. These symptoms may represent a serious problem that is an emergency. Do not wait to see if the symptoms will go away. Get medical help right away. Call your local emergency services (911 in the [place] Do not drive yourself  to the hospital. Summary  Shortness of breath is when a person has trouble breathing enough air. It can be a sign of a medical problem.  Avoid things that irritate your lungs, such as smoking, pollution, mold, and dust.  Pay attention to changes in your symptoms and contact your health care provider if you have a hard time completing daily activities because of shortness of breath. This information is not intended to replace advice given to you by your health care provider. Make sure you discuss any questions you have with your health care provider. Document Revised: 09/24/2017 Document Reviewed: 09/24/2017 Elsevier Patient Education  [place].  Anemia  Anemia is a condition in which you do not have enough red blood cells or hemoglobin. Hemoglobin is a substance in red blood cells that carries oxygen. When you do not have enough red blood cells or hemoglobin (are anemic), your body cannot get enough oxygen and your organs may not work properly. As a result, you may feel very tired or have other problems. What are the causes? Common causes of anemia include:  Excessive bleeding. Anemia can be caused by excessive bleeding inside or outside the body, including bleeding from the intestine or from periods in women.  Poor nutrition.  Long-lasting (chronic) kidney, thyroid, and liver disease.  Bone marrow disorders.  Cancer and treatments for cancer.  HIV (human immunodeficiency virus) and AIDS (acquired immunodeficiency syndrome).  Treatments for HIV and AIDS.  Spleen problems.  Blood  disorders.  Infections, medicines, and autoimmune disorders that destroy red blood cells. What are the signs or symptoms? Symptoms of this condition include:  Minor weakness.  Dizziness.  Headache.  Feeling heartbeats that are irregular or faster than normal (palpitations).  Shortness of breath, especially with exercise.  Paleness.  Cold  sensitivity.  Indigestion.  Nausea.  Difficulty sleeping.  Difficulty concentrating. Symptoms may occur suddenly or develop slowly. If your anemia is mild, you may not have symptoms. How is this diagnosed? This condition is diagnosed based on:  Blood tests.  Your medical history.  A physical exam.  Bone marrow biopsy. Your health care provider may also check your stool (feces) for blood and may do additional testing to look for the cause of your bleeding. You may also have other tests, including:  Imaging tests, such as a CT scan or MRI.  Endoscopy.  Colonoscopy. How is this treated? Treatment for this condition depends on the cause. If you continue to lose a lot of blood, you may need to be treated at a hospital. Treatment may include:  Taking supplements of iron, vitamin D32, or folic acid.  Taking a hormone medicine (erythropoietin) that can help to stimulate red blood cell growth.  Having a blood transfusion. This may be needed if you lose a lot of blood.  Making changes to your diet.  Having surgery to remove your spleen. Follow these instructions at home:  Take over-the-counter and prescription medicines only as told by your health care provider.  Take supplements only as told by your health care provider.  Follow any diet instructions that you were given.  Keep all follow-up visits as told by your health care provider. This is important. Contact a health care provider if:  You develop new bleeding anywhere in the body. Get help right away if:  You are very weak.  You are short of breath.  You have pain in your abdomen or chest.  You are dizzy or feel faint.  You have trouble concentrating.  You have bloody or black, tarry stools.  You vomit repeatedly or you vomit up blood. Summary  Anemia is a condition in which you do not have enough red blood cells or enough of a substance in your red blood cells that carries oxygen  (hemoglobin).  Symptoms may occur suddenly or develop slowly.  If your anemia is mild, you may not have symptoms.  This condition is diagnosed with blood tests as well as a medical history and physical exam. Other tests may be needed.  Treatment for this condition depends on the cause of the anemia. This information is not intended to replace advice given to you by your health care provider. Make sure you discuss any questions you have with your health care provider. Document Revised: 04/06/2017 Document Reviewed: 05/26/2016 Elsevier Patient Education  [place].

## 2019-07-21 ENCOUNTER — Encounter: Payer: Self-pay | Admitting: Family Medicine

## 2019-07-22 ENCOUNTER — Other Ambulatory Visit: Payer: Self-pay

## 2019-07-23 ENCOUNTER — Encounter: Payer: Self-pay | Admitting: Family Medicine

## 2019-07-23 ENCOUNTER — Ambulatory Visit (INDEPENDENT_AMBULATORY_CARE_PROVIDER_SITE_OTHER): Payer: Medicare Other | Admitting: Family Medicine

## 2019-07-23 VITALS — BP 150/62 | HR 72 | Temp 97.7°F | Wt 155.0 lb

## 2019-07-23 DIAGNOSIS — Z0000 Encounter for general adult medical examination without abnormal findings: Secondary | ICD-10-CM | POA: Diagnosis not present

## 2019-07-23 DIAGNOSIS — E038 Other specified hypothyroidism: Secondary | ICD-10-CM | POA: Diagnosis not present

## 2019-07-23 DIAGNOSIS — N189 Chronic kidney disease, unspecified: Secondary | ICD-10-CM

## 2019-07-23 DIAGNOSIS — I5033 Acute on chronic diastolic (congestive) heart failure: Secondary | ICD-10-CM | POA: Diagnosis not present

## 2019-07-23 DIAGNOSIS — D631 Anemia in chronic kidney disease: Secondary | ICD-10-CM | POA: Diagnosis not present

## 2019-07-23 DIAGNOSIS — J449 Chronic obstructive pulmonary disease, unspecified: Secondary | ICD-10-CM

## 2019-07-23 DIAGNOSIS — I509 Heart failure, unspecified: Secondary | ICD-10-CM | POA: Insufficient documentation

## 2019-07-23 LAB — BASIC METABOLIC PANEL
BUN: 39 mg/dL — ABNORMAL HIGH (ref 6–23)
CO2: 28 mEq/L (ref 19–32)
Calcium: 8.9 mg/dL (ref 8.4–10.5)
Chloride: 102 mEq/L (ref 96–112)
Creatinine, Ser: 1.89 mg/dL — ABNORMAL HIGH (ref 0.40–1.20)
GFR: 31.68 mL/min — ABNORMAL LOW (ref >60.00)
Glucose, Bld: 134 mg/dL — ABNORMAL HIGH (ref 70–99)
Potassium: 3.6 mEq/L (ref 3.5–5.1)
Sodium: 139 mEq/L (ref 135–145)

## 2019-07-23 LAB — LIPID PANEL
Cholesterol: 89 mg/dL (ref 0–200)
HDL: 36.4 mg/dL — ABNORMAL LOW (ref >39.00)
LDL Cholesterol: 33 mg/dL (ref 0–99)
NonHDL: 52.11
Total CHOL/HDL Ratio: 2
Triglycerides: 97 mg/dL (ref 0.0–149.0)
VLDL: 19.4 mg/dL (ref 0.0–40.0)

## 2019-07-23 LAB — BRAIN NATRIURETIC PEPTIDE: Pro B Natriuretic peptide (BNP): 637 pg/mL — ABNORMAL HIGH (ref 0.0–100.0)

## 2019-07-23 LAB — CBC
HCT: 30.5 % — ABNORMAL LOW (ref 36.0–46.0)
Hemoglobin: 10 g/dL — ABNORMAL LOW (ref 12.0–15.0)
MCHC: 32.8 g/dL (ref 30.0–36.0)
MCV: 85.5 fl (ref 78.0–100.0)
Platelets: 198 10*3/uL (ref 150.0–400.0)
RBC: 3.56 Mil/uL — ABNORMAL LOW (ref 3.87–5.11)
RDW: 15.6 % — ABNORMAL HIGH (ref 11.5–15.5)
WBC: 5.5 10*3/uL (ref 4.0–10.5)

## 2019-07-23 NOTE — Patient Instructions (Signed)
Preventive Care 72 Years and Older, Female Preventive care refers to lifestyle choices and visits with your health care provider that can promote health and wellness. This includes:  A yearly physical exam. This is also called an annual well check.  Regular dental and eye exams.  Immunizations.  Screening for certain conditions.  Healthy lifestyle choices, such as diet and exercise. What can I expect for my preventive care visit? Physical exam Your health care provider will check:  Height and weight. These may be used to calculate body mass index (BMI), which is a measurement that tells if you are at a healthy weight.  Heart rate and blood pressure.  Your skin for abnormal spots. Counseling Your health care provider may ask you questions about:  Alcohol, tobacco, and drug use.  Emotional well-being.  Home and relationship well-being.  Sexual activity.  Eating habits.  History of falls.  Memory and ability to understand (cognition).  Work and work Statistician.  Pregnancy and menstrual history. What immunizations do I need?  Influenza (flu) vaccine  This is recommended every year. Tetanus, diphtheria, and pertussis (Tdap) vaccine  You may need a Td booster every 10 years. Varicella (chickenpox) vaccine  You may need this vaccine if you have not already been vaccinated. Zoster (shingles) vaccine  You may need this after age 72. Pneumococcal conjugate (PCV13) vaccine  One dose is recommended after age 72. Pneumococcal polysaccharide (PPSV23) vaccine  One dose is recommended after age 72. Measles, mumps, and rubella (MMR) vaccine  You may need at least one dose of MMR if you were born in 1957 or later. You may also need a second dose. Meningococcal conjugate (MenACWY) vaccine  You may need this if you have certain conditions. Hepatitis A vaccine  You may need this if you have certain conditions or if you travel or work in places where you may be exposed  to hepatitis A. Hepatitis B vaccine  You may need this if you have certain conditions or if you travel or work in places where you may be exposed to hepatitis B. Haemophilus influenzae type b (Hib) vaccine  You may need this if you have certain conditions. You may receive vaccines as individual doses or as more than one vaccine together in one shot (combination vaccines). Talk with your health care provider about the risks and benefits of combination vaccines. What tests do I need? Blood tests  Lipid and cholesterol levels. These may be checked every 5 years, or more frequently depending on your overall health.  Hepatitis C test.  Hepatitis B test. Screening  Lung cancer screening. You may have this screening every year starting at age 72 if you have a 30-pack-year history of smoking and currently smoke or have quit within the past 15 years.  Colorectal cancer screening. All adults should have this screening starting at age 72 and continuing until age 15. Your health care provider may recommend screening at age 23 if you are at increased risk. You will have tests every 1-10 years, depending on your results and the type of screening test.  Diabetes screening. This is done by checking your blood sugar (glucose) after you have not eaten for a while (fasting). You may have this done every 1-3 years.  Mammogram. This may be done every 1-2 years. Talk with your health care provider about how often you should have regular mammograms.  BRCA-related cancer screening. This may be done if you have a family history of breast, ovarian, tubal, or peritoneal cancers.  Other tests  Sexually transmitted disease (STD) testing.  Bone density scan. This is done to screen for osteoporosis. You may have this done starting at age 72. Follow these instructions at home: Eating and drinking  Eat a diet that includes fresh fruits and vegetables, whole grains, lean protein, and low-fat dairy products. Limit  your intake of foods with high amounts of sugar, saturated fats, and salt.  Take vitamin and mineral supplements as recommended by your health care provider.  Do not drink alcohol if your health care provider tells you not to drink.  If you drink alcohol: ? Limit how much you have to 0-1 drink a day. ? Be aware of how much alcohol is in your drink. In the [place] one drink equals one 12 oz bottle of beer (355 mL), one 5 oz glass of wine (148 mL), or one 1 oz glass of hard liquor (44 mL). Lifestyle  Take daily care of your teeth and gums.  Stay active. Exercise for at least 30 minutes on 5 or more days each week.  Do not use any products that contain nicotine or tobacco, such as cigarettes, e-cigarettes, and chewing tobacco. If you need help quitting, ask your health care provider.  If you are sexually active, practice safe sex. Use a condom or other form of protection in order to prevent STIs (sexually transmitted infections).  Talk with your health care provider about taking a low-dose aspirin or statin. What's next?  Go to your health care provider once a year for a well check visit.  Ask your health care provider how often you should have your eyes and teeth checked.  Stay up to date on all vaccines. This information is not intended to replace advice given to you by your health care provider. Make sure you discuss any questions you have with your health care provider. Document Revised: 04/18/2018 Document Reviewed: 04/18/2018 Elsevier Patient Education  2020 [place].  Chronic Obstructive Pulmonary Disease Chronic obstructive pulmonary disease (COPD) is a long-term (chronic) lung problem. When you have COPD, it is hard for air to get in and out of your lungs. Usually the condition gets worse over time, and your lungs will never return to normal. There are things you can do to keep yourself as healthy as possible.  Your doctor may treat your condition  with: ? Medicines. ? Oxygen. ? Lung surgery.  Your doctor may also recommend: ? Rehabilitation. This includes steps to make your body work better. It may involve a team of specialists. ? Quitting smoking, if you smoke. ? Exercise and changes to your diet. ? Comfort measures (palliative care). Follow these instructions at home: Medicines  Take over-the-counter and prescription medicines only as told by your doctor.  Talk to your doctor before taking any cough or allergy medicines. You may need to avoid medicines that cause your lungs to be dry. Lifestyle  If you smoke, stop. Smoking makes the problem worse. If you need help quitting, ask your doctor.  Avoid being around things that make your breathing worse. This may include smoke, chemicals, and fumes.  Stay active, but remember to rest as well.  Learn and use tips on how to relax.  Make sure you get enough sleep. Most adults need at least 7 hours of sleep every night.  Eat healthy foods. Eat smaller meals more often. Rest before meals. Controlled breathing Learn and use tips on how to control your breathing as told by your doctor. Try:  Breathing in (inhaling)  through your nose for 1 second. Then, pucker your lips and breath out (exhale) through your lips for 2 seconds.  Putting one hand on your belly (abdomen). Breathe in slowly through your nose for 1 second. Your hand on your belly should move out. Pucker your lips and breathe out slowly through your lips. Your hand on your belly should move in as you breathe out.  Controlled coughing Learn and use controlled coughing to clear mucus from your lungs. Follow these steps: 1. Lean your head a little forward. 2. Breathe in deeply. 3. Try to hold your breath for 3 seconds. 4. Keep your mouth slightly open while coughing 2 times. 5. Spit any mucus out into a tissue. 6. Rest and do the steps again 1 or 2 times as needed. General instructions  Make sure you get all the shots  (vaccines) that your doctor recommends. Ask your doctor about a flu shot and a pneumonia shot.  Use oxygen therapy and pulmonary rehabilitation if told by your doctor. If you need home oxygen therapy, ask your doctor if you should buy a tool to measure your oxygen level (oximeter).  Make a COPD action plan with your doctor. This helps you to know what to do if you feel worse than usual.  Manage any other conditions you have as told by your doctor.  Avoid going outside when it is very hot, cold, or humid.  Avoid people who have a sickness you can catch (contagious).  Keep all follow-up visits as told by your doctor. This is important. Contact a doctor if:  You cough up more mucus than usual.  There is a change in the color or thickness of the mucus.  It is harder to breathe than usual.  Your breathing is faster than usual.  You have trouble sleeping.  You need to use your medicines more often than usual.  You have trouble doing your normal activities such as getting dressed or walking around the house. Get help right away if:  You have shortness of breath while resting.  You have shortness of breath that stops you from: ? Being able to talk. ? Doing normal activities.  Your chest hurts for longer than 5 minutes.  Your skin color is more blue than usual.  Your pulse oximeter shows that you have low oxygen for longer than 5 minutes.  You have a fever.  You feel too tired to breathe normally. Summary  Chronic obstructive pulmonary disease (COPD) is a long-term lung problem.  The way your lungs work will never return to normal. Usually the condition gets worse over time. There are things you can do to keep yourself as healthy as possible.  Take over-the-counter and prescription medicines only as told by your doctor.  If you smoke, stop. Smoking makes the problem worse. This information is not intended to replace advice given to you by your health care provider. Make  sure you discuss any questions you have with your health care provider. Document Revised: 04/06/2017 Document Reviewed: 05/29/2016 Elsevier Patient Education  2020 [place].  Heart Failure Exacerbation  Heart failure is a condition in which the heart does not fill up with enough blood, and therefore does not pump enough blood and oxygen to the body. When this happens, parts of the body do not get the blood and oxygen they need to function properly. This can cause symptoms such as breathing problems, fatigue, swelling, and confusion. Heart failure exacerbation refers to heart failure symptoms that get worse.  The symptoms may get worse suddenly or develop slowly over time. Heart failure exacerbation is a serious medical problem that should be treated right away. What are the causes? A heart failure exacerbation can be triggered by:  Not taking your heart failure medicines correctly.  Infections.  Eating an unhealthy diet or a diet that is high in salt (sodium).  Drinking too much fluid.  Drinking alcohol.  Taking illegal drugs, such as cocaine or methamphetamine.  Not exercising. Other causes include:  Other heart conditions such as an irregular heartbeat (arrhythmia).  Anemia.  Other medical problems, such as kidney failure. Sometimes the cause of the exacerbation is not known. What are the signs or symptoms? When heart failure symptoms suddenly or slowly get worse, this may be a sign of heart failure exacerbation. Symptoms of heart failure include:  Breathing problems or shortness of breath.  Chronic coughing or wheezing.  Fatigue.  Nausea or lack of appetite.  Feeling light-headed.  Confusion or memory loss.  Increased heart rate or irregular heartbeat.  Buildup of fluid in the legs, ankles, feet, or abdomen.  Difficulty breathing when lying down. How is this diagnosed? This condition is diagnosed based on:  Your symptoms and medical history.  A physical  exam. You may also have tests, including:  Electrocardiogram (ECG). This test measures the electrical activity of your heart.  Echocardiogram. This test uses sound waves to take a picture of your heart to see how well it works.  Blood tests.  Imaging tests, such as: ? Chest X-ray. ? MRI. ? Ultrasound.  Stress test. This test examines how well your heart functions when you exercise. Your heart is monitored while you exercise on a treadmill or exercise bike. If you cannot exercise, medicines may be used to increase your heartbeat in place of exercise.  Cardiac catheterization. During this test, a thin, flexible tube (catheter) is inserted into a blood vessel and threaded up to your heart. This test allows your health care provider to check the arteries that lead to your heart (coronary arteries).  Right heart catheterization. During this test, the pressure in your heart is measured. How is this treated? This condition may be treated by:  Adjusting your heart medicines.  Maintaining a healthy lifestyle. This includes: ? Eating a heart-healthy diet that is low in sodium. ? Not using any products that contain nicotine or tobacco, such as cigarettes and e-cigarettes. ? Regular exercise. ? Monitoring your fluid intake. ? Monitoring your weight and reporting changes to your health care provider.  Treating sleep apnea, if you have this condition.  Surgery. This may include: ? Implanting a device that helps both sides of your heart contract at the same time (cardiac resynchronization therapy device). This can help with heart function and relieve heart failure symptoms. ? Implanting a device that can correct heart rhythm problems (implantable cardioverter defibrillator). ? Connecting a device to your heart to help it pump blood (ventricular assist device). ? Heart transplant. Follow these instructions at home: Medicines  Take over-the-counter and prescription medicines only as told by  your health care provider.  Do not stop taking your medicines or change the amount you take. If you are having problems or side effects from your medicines, talk to your health care provider.  If you are having difficulty paying for your medicines, contact a social worker or your clinic. There are many programs to assist with medicine costs.  Talk to your health care provider before starting any new medicines or supplements.  Make sure your health care provider and pharmacist have a list of all the medicines you are taking. Eating and drinking   Avoid drinking alcohol.  Eat a heart-healthy diet as told by your health care provider. This includes: ? Plenty of fruits and vegetables. ? Lean proteins. ? Low-fat dairy. ? Whole grains. ? Foods that are low in sodium. Activity   Exercise regularly as told by your health care provider. Balance exercise with rest.  Ask your health care provider what activities are safe for you. This includes sexual activity, exercise, and daily tasks at home or work. Lifestyle  Do not use any products that contain nicotine or tobacco, such as cigarettes and e-cigarettes. If you need help quitting, ask your health care provider.  Maintain a healthy weight. Ask your health care provider what weight is healthy for you.  Consider joining a patient support group. This can help with emotional problems you may have, such as stress and anxiety. General instructions  Talk to your health care provider about flu and pneumonia vaccines.  Keep a list of medicines that you are taking. This may help in emergency situations.  Keep all follow-up visits as told by your health care provider. This is important. Contact a health care provider if:  You have questions about your medicines or you miss a dose.  You feel anxious, depressed, or stressed.  You have swelling in your feet, ankles, legs, or abdomen.  You have shortness of breath during activity or  exercise.  You have a cough.  You have a fever.  You have trouble sleeping.  You gain 2-3 lb (1-1.4 kg) in 24 hours or 5 lb (2.3 kg) in a week. Get help right away if:  You have chest pain.  You have shortness of breath while resting.  You have severe fatigue.  You are confused.  You have severe dizziness.  You have a rapid or irregular heartbeat.  You have nausea or you vomit.  You have a cough that is worse at night or you cannot lie flat.  You have a cough that will not go away.  You have severe depression or sadness. Summary  When heart failure symptoms get worse, it is called heart failure exacerbation.  Common causes of this condition include taking medicines incorrectly, infections, and drinking alcohol.  This condition may be treated by adjusting medicines, maintaining a healthy lifestyle, or surgery.  Do not stop taking your medicines or change the amount you take. If you are having problems or side effects from your medicines, talk to your health care provider. This information is not intended to replace advice given to you by your health care provider. Make sure you discuss any questions you have with your health care provider. Document Revised: 04/06/2017 Document Reviewed: 09/05/2016 Elsevier Patient Education  [place].

## 2019-07-23 NOTE — Progress Notes (Signed)
Subjective:    Morgan Clay is a 74 y.o. female who presents for Medicare Annual/Subsequent preventive examination.  Preventive Screening-Counseling & Management  Tobacco Social History   Tobacco Use  Smoking Status Never Smoker  Smokeless Tobacco Never Used     Problems Prior to Visit 1. CHF exacerbation- increased lasix for 4 days.  Still with SOB. Has Cardiology f/u this wk. 2. Hypothyroidism- TSH recently checked.  Taking synthroid daily 3.Anemia in CKD-  Hgb 10.8.  Pt has not started iron as she does not want to be constipated.  Does not want to take miralax to help with constipation as does not want to have to run to the bathroom. 4. COPD-using albuterol inhaler prn  Current Problems (verified) Patient Active Problem List   Diagnosis Date Noted  . Hepatitis C   . Gout   . Anemia in chronic kidney disease 01/18/2018  . Pulmonary hypertension ([place]) on echocardiogram 01/14/2018  . Angiodysplasia of intestinal tract 01/14/2018  . Polyp of transverse colon   . Diverticulosis of colon without hemorrhage   . Atrial fibrillation, chronic 10/25/2017  . Aortic atherosclerosis ([place]) 08/30/2017  . Diverticulitis of left colon status post robotic low anterior to sigmoid resection 05/22/2018 07/31/2017  . Hypertension associated with diabetes ([place]) 06/07/2017  . MDD (major depressive disorder), recurrent, in partial remission ([place]) 06/07/2017  . Asthma 05/22/2017  . Chronic obstructive pulmonary disease ([place])   . Atypical atrial flutter ([place]) 09/28/2015  . Type 2 diabetes, controlled, with renal manifestation ([place]) 11/24/2013  . CKD (chronic kidney disease) stage 4, GFR 15-29 ml/min  11/24/2013  . Nonischemic cardiomyopathy ([place]) 11/22/2010  . Obesity 05/27/2009  . OSTEOPENIA 09/30/2008  . FIBROIDS, UTERUS 03/05/2008  . OBSTRUCTIVE SLEEP APNEA 11/14/2007  . Chronic diastolic CHF (congestive heart failure) ([place]) 04/24/2007  . Hypothyroidism 01/28/2007  . Biventricular ICD  (implantable cardioverter-defibrillator) in place 01/08/2007    Medications Prior to Visit Current Outpatient Medications on File Prior to Visit  Medication Sig Dispense Refill  . albuterol (PROVENTIL HFA;VENTOLIN HFA) 108 (90 Base) MCG/ACT inhaler Inhale 2 puffs into the lungs every 6 (six) hours as needed for wheezing or shortness of breath. 1 Inhaler 3  . allopurinol (ZYLOPRIM) 300 MG tablet Take 300 mg by mouth daily.    [name] apixaban (ELIQUIS) 5 MG TABS tablet Take 1 tablet (5 mg total) by mouth 2 (two) times daily. 60 tablet 11  . benzonatate (TESSALON) 100 MG capsule Take 1 capsule (100 mg total) by mouth 3 (three) times daily as needed for cough. 30 capsule 0  . BIDIL 20-37.5 MG tablet Take 1 tablet by mouth twice daily 180 tablet 0  . carvedilol (COREG) 25 MG tablet Take 1 tablet by mouth twice daily 180 tablet 3  . colchicine 0.6 MG tablet Take 0.6 mg by mouth daily as needed (gout flares).   0  . gabapentin (NEURONTIN) 300 MG capsule Take 1 capsule (300 mg total) by mouth 2 (two) times daily. 180 capsule 2  . levothyroxine (SYNTHROID) 137 MCG tablet TAKE 1 TABLET BY MOUTH ONCE DAILY BEFORE BREAKFAST 90 tablet 0  . nitroGLYCERIN (NITROSTAT) 0.4 MG SL tablet Place 0.4 mg under the tongue every 5 (five) minutes as needed for chest pain.    . pantoprazole (PROTONIX) 40 MG tablet Take 1 tablet (40 mg total) by mouth daily. 30 tablet 4  . Potassium Chloride ER 20 MEQ TBCR Take 40 mEq by mouth daily. 180 tablet 1  . sertraline (ZOLOFT) 50 MG tablet Take  1 tablet (50 mg total) by mouth daily. 30 tablet 3  . torsemide (DEMADEX) 20 MG tablet Take 3 tablets (60 mg total) by mouth every morning AND 2 tablets (40 mg total) every evening. 150 tablet 1  . traMADol (ULTRAM) 50 MG tablet Take 1-2 tablets (50-100 mg total) by mouth every 6 (six) hours as needed for moderate pain or severe pain (mild pain). 20 tablet 0  . traZODone (DESYREL) 50 MG tablet TAKE 1 TABLET BY MOUTH AT BEDTIME 90 tablet 0  .  Vitamin D, Ergocalciferol, (DRISDOL) 1.25 MG (50000 UT) CAPS capsule Take 1 capsule (50,000 Units total) by mouth every 7 (seven) days. 12 capsule 0  . metolazone (ZAROXOLYN) 2.5 MG tablet Take 1 tablet (2.5 mg total) by mouth as directed for 1 day. Then as directed by the CHF clinic 5 tablet 3   No current facility-administered medications on file prior to visit.    Current Medications (verified) Current Outpatient Medications  Medication Sig Dispense Refill  . albuterol (PROVENTIL HFA;VENTOLIN HFA) 108 (90 Base) MCG/ACT inhaler Inhale 2 puffs into the lungs every 6 (six) hours as needed for wheezing or shortness of breath. 1 Inhaler 3  . allopurinol (ZYLOPRIM) 300 MG tablet Take 300 mg by mouth daily.    [name] apixaban (ELIQUIS) 5 MG TABS tablet Take 1 tablet (5 mg total) by mouth 2 (two) times daily. 60 tablet 11  . benzonatate (TESSALON) 100 MG capsule Take 1 capsule (100 mg total) by mouth 3 (three) times daily as needed for cough. 30 capsule 0  . BIDIL 20-37.5 MG tablet Take 1 tablet by mouth twice daily 180 tablet 0  . carvedilol (COREG) 25 MG tablet Take 1 tablet by mouth twice daily 180 tablet 3  . colchicine 0.6 MG tablet Take 0.6 mg by mouth daily as needed (gout flares).   0  . gabapentin (NEURONTIN) 300 MG capsule Take 1 capsule (300 mg total) by mouth 2 (two) times daily. 180 capsule 2  . levothyroxine (SYNTHROID) 137 MCG tablet TAKE 1 TABLET BY MOUTH ONCE DAILY BEFORE BREAKFAST 90 tablet 0  . nitroGLYCERIN (NITROSTAT) 0.4 MG SL tablet Place 0.4 mg under the tongue every 5 (five) minutes as needed for chest pain.    . pantoprazole (PROTONIX) 40 MG tablet Take 1 tablet (40 mg total) by mouth daily. 30 tablet 4  . Potassium Chloride ER 20 MEQ TBCR Take 40 mEq by mouth daily. 180 tablet 1  . sertraline (ZOLOFT) 50 MG tablet Take 1 tablet (50 mg total) by mouth daily. 30 tablet 3  . torsemide (DEMADEX) 20 MG tablet Take 3 tablets (60 mg total) by mouth every morning AND 2 tablets (40 mg  total) every evening. 150 tablet 1  . traMADol (ULTRAM) 50 MG tablet Take 1-2 tablets (50-100 mg total) by mouth every 6 (six) hours as needed for moderate pain or severe pain (mild pain). 20 tablet 0  . traZODone (DESYREL) 50 MG tablet TAKE 1 TABLET BY MOUTH AT BEDTIME 90 tablet 0  . Vitamin D, Ergocalciferol, (DRISDOL) 1.25 MG (50000 UT) CAPS capsule Take 1 capsule (50,000 Units total) by mouth every 7 (seven) days. 12 capsule 0  . metolazone (ZAROXOLYN) 2.5 MG tablet Take 1 tablet (2.5 mg total) by mouth as directed for 1 day. Then as directed by the CHF clinic 5 tablet 3   No current facility-administered medications for this visit.     Allergies (verified) Patient has no known allergies.   PAST HISTORY  Family History Family History  Problem Relation Age of Onset  . Asthma Father   . Heart attack Father   . Asthma Sister   . Lung cancer Sister   . Heart attack Mother   . Stroke Brother   . Colon cancer Neg Hx   . Esophageal cancer Neg Hx   . Pancreatic cancer Neg Hx   . Stomach cancer Neg Hx   . Liver disease Neg Hx     Social History Social History   Tobacco Use  . Smoking status: Never Smoker  . Smokeless tobacco: Never Used  Substance Use Topics  . Alcohol use: Yes    Comment: 01/03/2018 "couple drinks/month"     Are there smokers in your home (other than you)? No  Risk Factors Current exercise habits: The patient does not participate in regular exercise at present.  Dietary issues discussed: balanced diet   Cardiac risk factors: advanced age (older than 58 for men, 3 for women), diabetes mellitus, hypertension, obesity (BMI >= 30 kg/m2) and sedentary lifestyle.  Depression Screen (Note: if answer to either of the following is "Yes", a more complete depression screening is indicated)   Over the past two weeks, have you felt down, depressed or hopeless? No  Over the past two weeks, have you felt little interest or pleasure in doing things? No  Have you lost  interest or pleasure in daily life? No  Do you often feel hopeless? No  Do you cry easily over simple problems? No  Activities of Daily Living In your present state of health, do you have any difficulty performing the following activities?:  Driving? No Managing money?  No Feeding yourself? No Getting from bed to chair? No Climbing a flight of stairs? No Preparing food and eating?: No Bathing or showering? No Getting dressed: No Getting to the toilet? No Using the toilet:No Moving around from place to place: No In the past year have you fallen or had a near fall?:No  Hearing Difficulties: No Do you often ask people to speak up or repeat themselves? No Do you experience ringing or noises in your ears? No Do you have difficulty understanding soft or whispered voices? No   Do you feel that you have a problem with memory? No  Do you often misplace items? No  Do you feel safe at home?  Yes  Cognitive Testing  Alert? Yes  Normal Appearance?Yes  Oriented to person? Yes  Place? Yes   Time? Yes  Displays appropriate judgment?Yes  Can read the correct time from a watch face?Yes   Advanced Directives have been discussed with the patient? No  List the Names of Other Physician/Practitioners you currently use: 1.    Indicate any recent Medical Services you may have received from other than [facility] providers in the past year (date may be approximate).  Immunization History  Administered Date(s) Administered  . Influenza Whole 04/23/2008, 04/07/2010  . Influenza, High Dose Seasonal PF 02/25/2016, 06/07/2017  . Influenza,inj,Quad PF,6+ Mos 01/08/2013, 02/24/2014  . PFIZER SARS-COV-2 Vaccination 07/10/2019  . Pneumococcal Conjugate-13 08/25/2013  . Pneumococcal Polysaccharide-23 08/26/2014  . Td 05/08/2006, 05/30/2016  . Zoster 01/08/2013    Screening Tests Health Maintenance  Topic Date Due  . FOOT EXAM  06/07/2018  . INFLUENZA VACCINE  12/07/2018  . OPHTHALMOLOGY EXAM   07/07/2019  . HEMOGLOBIN A1C  01/17/2020  . MAMMOGRAM  01/20/2021  . TETANUS/TDAP  05/30/2026  . COLONOSCOPY  11/09/2027  . DEXA SCAN  Completed  .  Hepatitis C Screening  Completed  . PNA vac Low Risk Adult  Completed    All answers were reviewed with the patient and necessary referrals were made:  Billie Ruddy, MD   07/23/2019   History reviewed: allergies, current medications, past family history, past medical history, past social history, past surgical history and problem list  Review of Systems Pertinent items noted in HPI and remainder of comprehensive ROS otherwise negative.    Objective:     Vision:had recent eye exam.  Getting new rx glasses in 1 month  Body mass index is 31.31 kg/m. BP (!) 150/62 (BP Location: Left Arm, Patient Position: Sitting, Cuff Size: Normal)   Pulse 72   Temp 97.7 F (36.5 C) (Temporal)   Wt 155 lb (70.3 kg)   SpO2 97%   BMI 31.31 kg/m   BP (!) 150/62 (BP Location: Left Arm, Patient Position: Sitting, Cuff Size: Normal)   Pulse 72   Temp 97.7 F (36.5 C) (Temporal)   Wt 155 lb (70.3 kg)   SpO2 97%   BMI 31.31 kg/m  General appearance: alert, cooperative and no distress Head: Normocephalic, without obvious abnormality, atraumatic Eyes: conjunctivae/corneas clear. PERRL, EOM's intact. Fundi benign. Ears: normal TM's and external ear canals both ears Nose: Nares normal. Septum midline. Mucosa normal. No drainage or sinus tenderness. Throat: lips, mucosa, and tongue normal; teeth and gums normal Neck: no adenopathy, no carotid bruit, no JVD, supple, symmetrical, trachea midline and thyroid not enlarged, symmetric, no tenderness/mass/nodules Lungs: clear to auscultation bilaterally Heart: regular rate and rhythm, S1, S2 normal, no murmur, click, rub or gallop Abdomen: soft, non-tender; bowel sounds normal; no masses,  no organomegaly Extremities: extremities normal, atraumatic, no cyanosis or edema Pulses: 2+ and symmetric Skin:  Skin color, texture, turgor normal. No rashes or lesions Lymph nodes: Cervical, supraclavicular, and axillary nodes normal. Neurologic: Alert and oriented X 3, normal strength and tone. Normal symmetric reflexes. Normal coordination and gait     Assessment:     Pt is a 74 yo female seen for f/u on chronic conditions and AWV.     Plan:      Medicare annual wellness visit, subsequent -will order bone density if pt wishes. -Foot exam  Acute on chronic diastolic congestive heart failure ([place])  -Continue current medications -Follow-up with cardiology - Plan: Basic metabolic panel, Brain Natriuretic Peptide  Chronic obstructive pulmonary disease, unspecified COPD type ([place]) -continue albuterol inhaler prn  Anemia in chronic kidney disease, unspecified CKD stage  -last hgb 10.8 on 07/17/19  -encouraged to start iron supplement.  Pt declines at this time. -Continue follow-up with nephrology - Plan: CBC (no diff), Lipid panel  Other specified hypothyroidism  -TSH 3.87 on 07/17/19 -continue synthroid 137 mcg daily - Plan: Lipid panel   During the course of the visit the patient was educated and counseled about appropriate screening and preventive services including:    Screening mammography  Bone densitometry screening  Nutrition counseling   Diet review for nutrition referral? Yes ____  Not Indicated __x__   Patient Instructions (the written plan) was given to the patient.  Medicare Attestation I have personally reviewed: The patient's medical and social history Their use of alcohol, tobacco or illicit drugs Their current medications and supplements The patient's functional ability including ADLs,fall risks, home safety risks, cognitive, and hearing and visual impairment Diet and physical activities Evidence for depression or mood disorders  The patient's weight, height, BMI, and visual acuity have been recorded in the  chart.  I have made referrals, counseling, and  provided education to the patient based on review of the above and I have provided the patient with a written personalized care plan for preventive services.     Billie Ruddy, MD   07/23/2019

## 2019-07-24 ENCOUNTER — Telehealth (INDEPENDENT_AMBULATORY_CARE_PROVIDER_SITE_OTHER): Payer: Medicare Other | Admitting: Internal Medicine

## 2019-07-24 VITALS — Ht 59.0 in | Wt 153.0 lb

## 2019-07-24 DIAGNOSIS — D649 Anemia, unspecified: Secondary | ICD-10-CM

## 2019-07-24 DIAGNOSIS — I5042 Chronic combined systolic (congestive) and diastolic (congestive) heart failure: Secondary | ICD-10-CM

## 2019-07-24 DIAGNOSIS — I110 Hypertensive heart disease with heart failure: Secondary | ICD-10-CM | POA: Diagnosis not present

## 2019-07-24 DIAGNOSIS — I484 Atypical atrial flutter: Secondary | ICD-10-CM

## 2019-07-24 DIAGNOSIS — Z95810 Presence of automatic (implantable) cardiac defibrillator: Secondary | ICD-10-CM

## 2019-07-24 DIAGNOSIS — I428 Other cardiomyopathies: Secondary | ICD-10-CM

## 2019-07-24 DIAGNOSIS — I482 Chronic atrial fibrillation, unspecified: Secondary | ICD-10-CM

## 2019-07-24 NOTE — Patient Instructions (Addendum)
Medication Instructions:  Your physician recommends that you continue on your current medications as directed. Please refer to the Current Medication list given to you today.  **Increase your Toreseminde to 80mg  (4tablets) every other day x 4 doses.  Alternate with 60mg  (3 tablets) every other day  Labwork: None ordered.  Testing/Procedures: None ordered.  Follow-Up: Your physician wants you to follow-up in: You will receive a reminder letter in the mail two months in advance. If you don't receive a letter, please call our office to schedule the follow-up appointment.  Remote monitoring is used to monitor your Pacemaker of ICD from home. This monitoring reduces the number of office visits required to check your device to one time per year. It allows [name] to keep an eye on the functioning of your device to ensure it is working properly.   Any Other Special Instructions Will Be Listed Below (If Applicable)  Instructions reviewed over the phone with pt.  If you need a refill on your cardiac medications before your next appointment, please call your pharmacy.

## 2019-07-24 NOTE — Progress Notes (Signed)
Electrophysiology TeleHealth Note   Due to national recommendations of social distancing due to COVID 19, an audio/video telehealth visit is felt to be most appropriate for this patient at this time.  See MyChart message from today for the patient's consent to telehealth for [facility].   Date:  07/24/2019   ID:  Morgan Clay, DOB June 12, 1945, MRN [id number]  Location: patient's home  Provider location: [address] [place]  Evaluation Performed: Follow-up visit  PCP:  Billie Ruddy, MD  Cardiologist:   DM Electrophysiologist:  SK   Chief Complaint:  weakness  History of Present Illness:    Morgan Clay is a 74 y.o. female who presents via audio/video conferencing for a telehealth visit today.  Since last being seen in our clinic, the patient reports shortness of breath with exertion and just sitting;  Some edema PND and wieght up 10 lbs  Significant fatigue  No chest pain Recurrent anemia followed by hematology-- 2/21 note reviewed   Date Cr K Hgb LFTs TSH  1/19 1.76 4.3 14.7   0.33  4/19  1.96 4.2 9.7    3/21 1.89 3.6 10.0        DATE TEST EF   4/15 Echo 55-60 %   9/17 Echo 45-50 % LAE (52/3.1/35)  10/18 Echo  45-50% LVH mod  4/19 Echo  55-65%   9/20 Echo  45-50 AI mod     The patient denies symptoms of fevers, chills, cough, or new SOB worrisome for COVID 19.   COvid vaccine x 1  Past Medical History:  Diagnosis Date  . A-fib ([place])    on Eliquis  . AICD (automatic cardioverter/defibrillator) present 10/06/2015  . Anemia   . ARF (acute renal failure) ([place])   . Arthritis    "qwhere" (01/03/2018)  . Asthma    reports mild asthma since childhood - had COPD on dx list from prior PCP  . Chronic bronchitis ([place])    "get it most years; not this past year though" (01/03/2018)  . Chronic kidney disease    "? stage;  followed by [facility]; [name]" (01/03/2018)  . Chronic systolic congestive heart failure ([place])    . DEPRESSION 12/17/2009   Annotation: PHQ-9 score = 14 done on 12/17/2009 Qualifier: Diagnosis of  By: Hassell Done FNP, Tori Milks    . Diabetes mellitus without complication (HCC)    DIET CONTROLLED   . Diverticulosis   . Enteritis   . FIBROIDS, UTERUS 03/05/2008  . GI bleeding 01/03/2018  . Glaucoma   . Gout   . Hepatitis C    HEPATITIS C - s/p treatment with Harvoni, saw hepatology, Dawn Drazek  . History of blood transfusion ~ 11/2017  . Hyperlipemia 12/06/2012  . HYPERTENSION, BENIGN 04/24/2007  . Hypothyroidism   . LBBB (left bundle branch block)   . Lower GI bleeding    "been dealing w/it since 07/2017" (01/03/2018)  . Nonischemic cardiomyopathy ([place])   . OBESITY 05/27/2009  . OBSTRUCTIVE SLEEP APNEA 11/14/2007   no CPAP  . OSTEOPENIA 09/30/2008  . Pain and swelling of left upper extremity 08/08/2017  . Personal history of other infectious and parasitic disease    Hepatitis B  . Pneumonia    "several times" (01/03/2018)  . Pulmonary edema, acute ([place]) 08/09/2017    Past Surgical History:  Procedure Laterality Date  . APPENDECTOMY    . CARDIAC CATHETERIZATION    . CARDIOVERSION N/A 12/14/2014   Procedure: CARDIOVERSION;  Surgeon:  Dorothy Spark, MD;  Location: [facility];  Service: Cardiovascular;  Laterality: N/A;  . CARDIOVERSION N/A 02/26/2017   Procedure: CARDIOVERSION;  Surgeon: Evans Lance, MD;  Location: [facility];  Service: Cardiovascular;  Laterality: N/A;  . CARDIOVERSION N/A 07/24/2017   Procedure: CARDIOVERSION;  Surgeon: Thayer Headings, MD;  Location: [facility] ENDOSCOPY;  Service: Cardiovascular;  Laterality: N/A;  . COLONOSCOPY N/A 11/08/2017   Procedure: COLONOSCOPY;  Surgeon: Milus Banister, MD;  Location: [facility] ENDOSCOPY;  Service: Endoscopy;  Laterality: N/A;  . DILATION AND CURETTAGE OF UTERUS    . EP IMPLANTABLE DEVICE N/A 10/06/2015   Procedure: BIV ICD Generator Changeout;  Surgeon: [name], MD;  Location: [facility];  Service:  Cardiovascular;  Laterality: N/A;  . ESOPHAGOGASTRODUODENOSCOPY N/A 10/26/2017   Procedure: ESOPHAGOGASTRODUODENOSCOPY (EGD);  Surgeon: Ladene Artist, MD;  Location: [facility];  Service: Endoscopy;  Laterality: N/A;  . ESOPHAGOGASTRODUODENOSCOPY (EGD) WITH PROPOFOL N/A 11/07/2017   Procedure: ESOPHAGOGASTRODUODENOSCOPY (EGD) WITH PROPOFOL;  Surgeon: Milus Banister, MD;  Location: [facility] ENDOSCOPY;  Service: Endoscopy;  Laterality: N/A;  . HOT HEMOSTASIS N/A 10/26/2017   Procedure: HOT HEMOSTASIS (ARGON PLASMA COAGULATION/BICAP);  Surgeon: Ladene Artist, MD;  Location: [facility];  Service: Endoscopy;  Laterality: N/A;  . INSERT / REPLACE / REMOVE PACEMAKER  ?2008  . POLYPECTOMY  11/08/2017   Procedure: POLYPECTOMY;  Surgeon: Milus Banister, MD;  Location: [facility];  Service: Endoscopy;;  . PROCTOSCOPY N/A 05/22/2018   Procedure: RIGID PROCTOSCOPY;  Surgeon: Michael Boston, MD;  Location: [facility];  Service: General;  Laterality: N/A;  . RIGHT HEART CATH N/A 02/13/2018   Procedure: RIGHT HEART CATH;  Surgeon: [name], MD;  Location: [facility];  Service: Cardiovascular;  Laterality: N/A;  . TUBAL LIGATION    . XI ROBOTIC ASSISTED LOWER ANTERIOR RESECTION N/A 05/22/2018   Procedure: XI ROBOTIC ASSISTED SIGMOID COLOECTOMY MOBILIZATION OF SPLENIC FLEXURE, FIREFLY ASSESSMENT OF PERFUSION;  Surgeon: Michael Boston, MD;  Location: [facility];  Service: General;  Laterality: N/A;  ERAS PATHWAY    Current Outpatient Medications  Medication Sig Dispense Refill  . albuterol (PROVENTIL HFA;VENTOLIN HFA) 108 (90 Base) MCG/ACT inhaler Inhale 2 puffs into the lungs every 6 (six) hours as needed for wheezing or shortness of breath. 1 Inhaler 3  . allopurinol (ZYLOPRIM) 300 MG tablet Take 300 mg by mouth daily.    [name] apixaban (ELIQUIS) 5 MG TABS tablet Take 1 tablet (5 mg total) by mouth 2 (two) times daily. 60 tablet 11  . benzonatate (TESSALON) 100 MG capsule Take 1 capsule (100 mg total) by  mouth 3 (three) times daily as needed for cough. 30 capsule 0  . BIDIL 20-37.5 MG tablet Take 1 tablet by mouth twice daily 180 tablet 0  . carvedilol (COREG) 25 MG tablet Take 1 tablet by mouth twice daily 180 tablet 3  . colchicine 0.6 MG tablet Take 0.6 mg by mouth daily as needed (gout flares).   0  . gabapentin (NEURONTIN) 300 MG capsule Take 1 capsule (300 mg total) by mouth 2 (two) times daily. 180 capsule 2  . levothyroxine (SYNTHROID) 137 MCG tablet TAKE 1 TABLET BY MOUTH ONCE DAILY BEFORE BREAKFAST 90 tablet 0  . nitroGLYCERIN (NITROSTAT) 0.4 MG SL tablet Place 0.4 mg under the tongue every 5 (five) minutes as needed for chest pain.    . pantoprazole (PROTONIX) 40 MG tablet Take 1 tablet (40 mg total) by mouth daily.  30 tablet 4  . Potassium Chloride ER 20 MEQ TBCR Take 40 mEq by mouth daily. 180 tablet 1  . sertraline (ZOLOFT) 50 MG tablet Take 1 tablet (50 mg total) by mouth daily. 30 tablet 3  . traMADol (ULTRAM) 50 MG tablet Take 1-2 tablets (50-100 mg total) by mouth every 6 (six) hours as needed for moderate pain or severe pain (mild pain). 20 tablet 0  . traZODone (DESYREL) 50 MG tablet TAKE 1 TABLET BY MOUTH AT BEDTIME 90 tablet 0  . Vitamin D, Ergocalciferol, (DRISDOL) 1.25 MG (50000 UT) CAPS capsule Take 1 capsule (50,000 Units total) by mouth every 7 (seven) days. 12 capsule 0  . metolazone (ZAROXOLYN) 2.5 MG tablet Take 1 tablet (2.5 mg total) by mouth as directed for 1 day. Then as directed by the CHF clinic 5 tablet 3  . torsemide (DEMADEX) 20 MG tablet Take 3 tablets (60 mg total) by mouth every morning AND 2 tablets (40 mg total) every evening. (Patient not taking: Reported on 07/24/2019) 150 tablet 1   No current facility-administered medications for this visit.    Allergies:   Patient has no known allergies.   Social History:  The patient  reports that she has never smoked. She has never used smokeless tobacco. She reports current alcohol use. She reports current drug  use. Drug: Marijuana.   Family History:  The patient's   family history includes Asthma in her father and sister; Heart attack in her father and mother; Lung cancer in her sister; Stroke in her brother.   ROS:  Please see the history of present illness.   All other systems are personally reviewed and negative.    Exam:    Vital Signs:  Ht 4\' 11"  (1.499 m)   Wt 153 lb (69.4 kg)   BMI 30.90 kg/m     Well appearing, alert and conversant, regular work of breathing,  good skin color Eyes- anicteric, neuro- grossly intact, skin- no apparent rash or lesions or cyanosis, mouth- oral mucosa is pink   Labs/Other Tests and Data Reviewed:    Recent Labs: 12/25/2018: Magnesium 2.2 07/17/2019: TSH 3.87 07/23/2019: BUN 39; Creatinine, Ser 1.89; Hemoglobin 10.0; Platelets 198.0; Potassium 3.6; Pro B Natriuretic peptide (BNP) 637.0; Sodium 139   Wt Readings from Last 3 Encounters:  07/24/19 153 lb (69.4 kg)  07/23/19 155 lb (70.3 kg)  07/17/19 153 lb (69.4 kg)     Other studies personally reviewed: Additional studies/ records that were reviewed today include: As above   Last device remote is reviewed from [facility] PDF dated 12/20 which reveals normal device function,   arrhythmias - none     ASSESSMENT & PLAN:   DiastolicSystolic heart failure Acute/ chronic  Nonischemic cardiomyopathy  High Risk Medication Surveillance  Hypertension    CRT-D- [employer]  The patient's device was interrogated.  The information was reviewed. No changes were made in the programming.    Diaphragmatic stimulation   Atrial Flutter-atypical & slow   Worsening HF symptoms with weight gain and edema Might be simply fluid and so will inrease diuretics for a week and reassess  See Below  The other major concern is recurrent atrial arrhythmia   Will arrange for remote transmission-- due in 2 weeks but will do early and reset schedule   Anemia is chronic  BP at heme office visit well  controlled Continue current meds We have discussed the physiology of heart failure including the importance of salt restriction and fluid restriction and  have reviewed sources of dietary salt and water.--she is compliant   COVID 19 screen The patient denies symptoms of COVID 19 at this time.  The importance of social distancing was discussed today.  Follow-up:phone call next week to assess weight and response to increase diuretics Next remote: asao  Current medicines are reviewed at length with the patient today.   The patient does not have concerns regarding her medicines.  The following changes were made today:   Increase torsemide 80 every other day for four doses and alternating with 60 mg every other day  Labs/ tests ordered today include: remote No orders of the defined types were placed in this encounter.   Future tests ( post COVID )     Patient Risk:  after full review of this patients clinical status, I feel that they are at moderate risk at this time.  Today, I have spent 12 minutes with the patient with telehealth technology discussing the above.  Signed, Virl Axe, MD  07/24/2019 3:45 PM     [address] [place] [id number] [phone] (office) [phone] (fax)

## 2019-07-31 ENCOUNTER — Telehealth: Payer: Self-pay

## 2019-07-31 NOTE — Telephone Encounter (Signed)
Phone call f/u from Dr Olin Pia telehealth visit last week.  Pt reports she is feeling much, much better and is down 6 pounds after the increase in diuretic.  Will forward information to Dr Caryl Comes. Pt with no other complaints or questions at this time. Pt verbalizes understanding and agrees with current plan.

## 2019-08-01 NOTE — Telephone Encounter (Signed)
Noted  

## 2019-08-05 ENCOUNTER — Ambulatory Visit (INDEPENDENT_AMBULATORY_CARE_PROVIDER_SITE_OTHER): Payer: Medicare Other | Admitting: *Deleted

## 2019-08-05 ENCOUNTER — Ambulatory Visit: Payer: Medicare Other | Attending: Internal Medicine

## 2019-08-05 DIAGNOSIS — Z95810 Presence of automatic (implantable) cardiac defibrillator: Secondary | ICD-10-CM

## 2019-08-05 DIAGNOSIS — Z23 Encounter for immunization: Secondary | ICD-10-CM

## 2019-08-05 LAB — CUP PACEART REMOTE DEVICE CHECK
Battery Remaining Longevity: 32 mo
Battery Voltage: 2.95 V
Brady Statistic AP VP Percent: 12.29 %
Brady Statistic AP VS Percent: 0.01 %
Brady Statistic AS VP Percent: 87.64 %
Brady Statistic AS VS Percent: 0.07 %
Brady Statistic RA Percent Paced: 11.83 %
Brady Statistic RV Percent Paced: 99.88 %
Date Time Interrogation Session: 20210330012304
HighPow Impedance: 45 Ohm
HighPow Impedance: 69 Ohm
Implantable Lead Implant Date: 20080818
Implantable Lead Implant Date: 20080818
Implantable Lead Implant Date: 20080818
Implantable Lead Location: 753858
Implantable Lead Location: 753859
Implantable Lead Location: 753860
Implantable Lead Model: 4193
Implantable Lead Model: 5076
Implantable Lead Model: 6947
Implantable Pulse Generator Implant Date: 20170531
Lead Channel Impedance Value: 247 Ohm
Lead Channel Impedance Value: 342 Ohm
Lead Channel Impedance Value: 380 Ohm
Lead Channel Impedance Value: 4047 Ohm
Lead Channel Impedance Value: 4047 Ohm
Lead Channel Impedance Value: 437 Ohm
Lead Channel Pacing Threshold Amplitude: 0.875 V
Lead Channel Pacing Threshold Amplitude: 1 V
Lead Channel Pacing Threshold Amplitude: 1.5 V
Lead Channel Pacing Threshold Pulse Width: 0.4 ms
Lead Channel Pacing Threshold Pulse Width: 0.4 ms
Lead Channel Pacing Threshold Pulse Width: 0.4 ms
Lead Channel Sensing Intrinsic Amplitude: 12.125 mV
Lead Channel Sensing Intrinsic Amplitude: 12.125 mV
Lead Channel Sensing Intrinsic Amplitude: 3.875 mV
Lead Channel Sensing Intrinsic Amplitude: 3.875 mV
Lead Channel Setting Pacing Amplitude: 1.75 V
Lead Channel Setting Pacing Amplitude: 2 V
Lead Channel Setting Pacing Amplitude: 2.5 V
Lead Channel Setting Pacing Pulse Width: 0.4 ms
Lead Channel Setting Pacing Pulse Width: 0.4 ms
Lead Channel Setting Sensing Sensitivity: 0.3 mV

## 2019-08-05 NOTE — Progress Notes (Signed)
   Covid-19 Vaccination Clinic  Name:  Morgan Clay    MRN: [id number] DOB: 1946-01-05  08/05/2019  Morgan Clay was observed post Covid-19 immunization for 15 minutes without incident. She was provided with Vaccine Information Sheet and instruction to access the V-Safe system.   Morgan Clay was instructed to call 911 with any severe reactions post vaccine: [name] Difficulty breathing  . Swelling of face and throat  . A fast heartbeat  . A bad rash all over body  . Dizziness and weakness   Immunizations Administered    Name Date Dose VIS Date Route   Pfizer COVID-19 Vaccine 08/05/2019  2:40 PM 0.3 mL 04/18/2019 Intramuscular   Manufacturer: [employer], [employer]   Lot: [id number]   [place]: [address]-9

## 2019-08-05 NOTE — Progress Notes (Signed)
ICD remote 

## 2019-08-06 ENCOUNTER — Ambulatory Visit (INDEPENDENT_AMBULATORY_CARE_PROVIDER_SITE_OTHER): Payer: Medicare Other

## 2019-08-06 DIAGNOSIS — I5032 Chronic diastolic (congestive) heart failure: Secondary | ICD-10-CM | POA: Diagnosis not present

## 2019-08-06 DIAGNOSIS — Z95810 Presence of automatic (implantable) cardiac defibrillator: Secondary | ICD-10-CM

## 2019-08-11 NOTE — Progress Notes (Signed)
EPIC Encounter for ICM Monitoring  Patient Name: Morgan Clay is a 74 y.o. female Date: 08/11/2019 Primary Care Physican: Billie Ruddy, MD PElectrophysiologist: Caryl Comes Nephrologist: [name] Bi-V Pacing:99.8% LastWeight: 137-140  Since 23-Jun-2019  AT/AF                           157 episodes   Time in AT/AF              7.9 hr/day (32.9%)  Longest AT/AF              28 hours  Spoke with patient and she is feeling much better after taking increased Torsemide dosage as instructed at 07/24/19 telehealth visit with Dr Caryl Comes.    Optivol thoracic impedancereturned to normal after taking increased Torsemide as instructed.  Prescribed:Torsemide20 mgTake 3 tablets (60 mg total) by mouth every morning AND 2 tablets (40 mg total) every evening.  08/11/2019 Patient reports taking Torsemide 60 mg daily. Potassium 20 mEq take 2 tablets (40 mEq total) by mouth daily.  Labs: 07/23/2019 Creatinine 1.89, BUN 39, Potassium 3.6, Sodium 139, GFR 31.68  07/17/2019 Creatinine 1.82, BUN 34, Potassium 3.7, Sodium 139, GFR 33.09 01/02/2019 Creatinine 1.91, BUN 37, Potassium 4.2, Sodium 136, GFR 26-30 12/25/2018 Creatinine 1.98, BUN 38, Potassium 3.8, Sodium 140 A complete set of results can be found in Results Review.  Recommendations:No changes and encouraged to call if experiencing any fluid symptoms.  Follow-up plan: ICM clinic phone appointment on5/08/2019. 91 day device clinic remote transmission 11/04/2019.   Copy of ICM check sent to [facility].   3 month ICM trend: 08/05/2019    1 Year ICM trend:       [name], RN 08/11/2019 11:30 AM

## 2019-08-12 DIAGNOSIS — D631 Anemia in chronic kidney disease: Secondary | ICD-10-CM | POA: Diagnosis not present

## 2019-08-12 DIAGNOSIS — N189 Chronic kidney disease, unspecified: Secondary | ICD-10-CM | POA: Diagnosis not present

## 2019-08-12 DIAGNOSIS — I129 Hypertensive chronic kidney disease with stage 1 through stage 4 chronic kidney disease, or unspecified chronic kidney disease: Secondary | ICD-10-CM | POA: Diagnosis not present

## 2019-08-12 DIAGNOSIS — R809 Proteinuria, unspecified: Secondary | ICD-10-CM | POA: Diagnosis not present

## 2019-08-12 DIAGNOSIS — N183 Chronic kidney disease, stage 3 unspecified: Secondary | ICD-10-CM | POA: Diagnosis not present

## 2019-08-12 DIAGNOSIS — M109 Gout, unspecified: Secondary | ICD-10-CM | POA: Diagnosis not present

## 2019-08-13 ENCOUNTER — Other Ambulatory Visit: Payer: Self-pay | Admitting: Nurse Practitioner

## 2019-08-13 DIAGNOSIS — K7469 Other cirrhosis of liver: Secondary | ICD-10-CM

## 2019-08-25 ENCOUNTER — Ambulatory Visit
Admission: RE | Admit: 2019-08-25 | Discharge: 2019-08-25 | Disposition: A | Discharge status: Home / Self Care | Payer: Medicare Other | Source: Ambulatory Visit | Attending: Nurse Practitioner | Admitting: Nurse Practitioner

## 2019-08-25 DIAGNOSIS — K7469 Other cirrhosis of liver: Secondary | ICD-10-CM

## 2019-08-25 DIAGNOSIS — K7460 Unspecified cirrhosis of liver: Secondary | ICD-10-CM | POA: Diagnosis not present

## 2019-09-01 ENCOUNTER — Other Ambulatory Visit (HOSPITAL_COMMUNITY): Payer: Self-pay | Admitting: Internal Medicine

## 2019-09-09 ENCOUNTER — Ambulatory Visit (INDEPENDENT_AMBULATORY_CARE_PROVIDER_SITE_OTHER): Payer: Medicare Other

## 2019-09-09 DIAGNOSIS — I5032 Chronic diastolic (congestive) heart failure: Secondary | ICD-10-CM

## 2019-09-09 DIAGNOSIS — Z95810 Presence of automatic (implantable) cardiac defibrillator: Secondary | ICD-10-CM | POA: Diagnosis not present

## 2019-09-10 NOTE — Progress Notes (Signed)
EPIC Encounter for ICM Monitoring  Patient Name: Morgan Clay is a 74 y.o. female Date: 09/10/2019 Primary Care Physican: Billie Ruddy, MD Primary Cardiologist: [name] Electrophysiologist: Caryl Comes Nephrologist: [name] Bi-V Pacing:99.9% 5/4/2021Weight: 148 lbs  Clinical Status (05-Aug-2019 to 09-Sep-2019)  Treated VT/VF 0 episodes   AT/AF             144 episodes   Time in AT/AF 3.9 hr/day (16.4%)    AT/AF >= 6 hr for 6 days.  Spoke with patient and reports feeling well at this time with the exception her legs are tired.  Denies fluid symptoms.    Optivol thoracic impedance normal.  Prescribed:Torsemide20 mgTake 3 tablets (60 mg total) by mouth every morning AND 2 tablets (40 mg total) every evening.  08/11/2019 Patient reports taking Torsemide 60 mg daily. Potassium 20 mEq take 2 tablets (40 mEq total) by mouth daily. Eliquis 5 mg take 1 tablet twice a day  Labs: 07/23/2019 Creatinine 1.89, BUN 39, Potassium 3.6, Sodium 139, GFR 31.68  07/17/2019 Creatinine 1.82, BUN 34, Potassium 3.7, Sodium 139, GFR 33.09 01/02/2019 Creatinine 1.91, BUN 37, Potassium 4.2, Sodium 136, GFR 26-30 12/25/2018 Creatinine 1.98, BUN 38, Potassium 3.8, Sodium 140 A complete set of results can be found in Results Review.  Recommendations: No changes and encouraged to call if experiencing any fluid symptoms.  Follow-up plan: ICM clinic phone appointment on6/11/2019. 91 day device clinic remote transmission 11/04/2019.   Copy of ICM check sent to [facility].  3 month ICM trend: 09/09/2019    1 Year ICM trend:       [name], RN 09/10/2019 8:31 AM

## 2019-09-29 ENCOUNTER — Other Ambulatory Visit: Payer: Self-pay | Admitting: Family Medicine

## 2019-09-29 NOTE — Telephone Encounter (Signed)
Pt LOV was 02/03/2019 and last refill was 07/23/2019 for 90 tablets, please advise

## 2019-10-01 ENCOUNTER — Other Ambulatory Visit: Payer: Self-pay

## 2019-10-02 ENCOUNTER — Ambulatory Visit (INDEPENDENT_AMBULATORY_CARE_PROVIDER_SITE_OTHER)
Admission: RE | Admit: 2019-10-02 | Discharge: 2019-10-02 | Disposition: A | Discharge status: Home / Self Care | Payer: Medicare Other | Source: Ambulatory Visit | Attending: Family Medicine | Admitting: Family Medicine

## 2019-10-02 ENCOUNTER — Ambulatory Visit (INDEPENDENT_AMBULATORY_CARE_PROVIDER_SITE_OTHER): Payer: Medicare Other | Admitting: Family Medicine

## 2019-10-02 ENCOUNTER — Encounter: Payer: Self-pay | Admitting: Family Medicine

## 2019-10-02 VITALS — BP 138/60 | HR 64 | Temp 98.4°F | Wt 148.8 lb

## 2019-10-02 DIAGNOSIS — R29898 Other symptoms and signs involving the musculoskeletal system: Secondary | ICD-10-CM

## 2019-10-02 DIAGNOSIS — M25561 Pain in right knee: Secondary | ICD-10-CM

## 2019-10-02 DIAGNOSIS — Z8739 Personal history of other diseases of the musculoskeletal system and connective tissue: Secondary | ICD-10-CM

## 2019-10-02 DIAGNOSIS — M25461 Effusion, right knee: Secondary | ICD-10-CM | POA: Diagnosis not present

## 2019-10-02 LAB — CBC WITH DIFFERENTIAL/PLATELET
Basophils Absolute: 0 10*3/uL (ref 0.0–0.1)
Basophils Relative: 0.4 % (ref 0.0–3.0)
Eosinophils Absolute: 0.1 10*3/uL (ref 0.0–0.7)
Eosinophils Relative: 2.6 % (ref 0.0–5.0)
HCT: 31.9 % — ABNORMAL LOW (ref 36.0–46.0)
Hemoglobin: 10.3 g/dL — ABNORMAL LOW (ref 12.0–15.0)
Lymphocytes Relative: 24.2 % (ref 12.0–46.0)
Lymphs Abs: 1.3 10*3/uL (ref 0.7–4.0)
MCHC: 32.2 g/dL (ref 30.0–36.0)
MCV: 83.1 fl (ref 78.0–100.0)
Monocytes Absolute: 0.5 10*3/uL (ref 0.1–1.0)
Monocytes Relative: 8.6 % (ref 3.0–12.0)
Neutro Abs: 3.4 10*3/uL (ref 1.4–7.7)
Neutrophils Relative %: 64.2 % (ref 43.0–77.0)
Platelets: 169 10*3/uL (ref 150.0–400.0)
RBC: 3.84 Mil/uL — ABNORMAL LOW (ref 3.87–5.11)
RDW: 17.4 % — ABNORMAL HIGH (ref 11.5–15.5)
WBC: 5.4 10*3/uL (ref 4.0–10.5)

## 2019-10-02 LAB — BASIC METABOLIC PANEL
BUN: 34 mg/dL — ABNORMAL HIGH (ref 6–23)
CO2: 27 mEq/L (ref 19–32)
Calcium: 9.5 mg/dL (ref 8.4–10.5)
Chloride: 102 mEq/L (ref 96–112)
Creatinine, Ser: 1.69 mg/dL — ABNORMAL HIGH (ref 0.40–1.20)
GFR: 36.02 mL/min — ABNORMAL LOW (ref >60.00)
Glucose, Bld: 113 mg/dL — ABNORMAL HIGH (ref 70–99)
Potassium: 3.6 mEq/L (ref 3.5–5.1)
Sodium: 135 mEq/L (ref 135–145)

## 2019-10-02 LAB — URIC ACID: Uric Acid, Serum: 12.1 mg/dL — ABNORMAL HIGH (ref 2.4–7.0)

## 2019-10-02 MED ORDER — PREDNISONE 10 MG PO TABS: ORAL_TABLET | ORAL | 0 refills | Status: DC

## 2019-10-02 NOTE — Patient Instructions (Signed)
Acute Knee Pain, Adult Acute knee pain is sudden and may be caused by damage, swelling, or irritation of the muscles and tissues that support your knee. The injury may result from:  A fall.  An injury to your knee from twisting motions.  A hit to the knee.  Infection. Acute knee pain may go away on its own with time and rest. If it does not, your health care provider may order tests to find the cause of the pain. These may include:  Imaging tests, such as an X-ray, MRI, or ultrasound.  Joint aspiration. In this test, fluid is removed from the knee.  Arthroscopy. In this test, a lighted tube is inserted into the knee and an image is projected onto a TV screen.  Biopsy. In this test, a sample of tissue is removed from the body and studied under a microscope. Follow these instructions at home: Pay attention to any changes in your symptoms. Take these actions to relieve your pain. If you have a knee sleeve or brace:   Wear the sleeve or brace as told by your health care provider. Remove it only as told by your health care provider.  Loosen the sleeve or brace if your toes tingle, become numb, or turn cold and blue.  Keep the sleeve or brace clean.  If the sleeve or brace is not waterproof: ? Do not let it get wet. ? Cover it with a watertight covering when you take a bath or shower. Activity  Rest your knee.  Do not do things that cause pain or make pain worse.  Avoid high-impact activities or exercises, such as running, jumping rope, or doing jumping jacks.  Work with a physical therapist to make a safe exercise program, as recommended by your health care provider. Do exercises as told by your physical therapist. Managing pain, stiffness, and swelling   If directed, put ice on the knee: ? Put ice in a plastic bag. ? Place a towel between your skin and the bag. ? Leave the ice on for 20 minutes, 2-3 times a day.  If directed, use an elastic bandage to put pressure  (compression) on your injured knee. This may control swelling, give support, and help with discomfort. General instructions  Take over-the-counter and prescription medicines only as told by your health care provider.  Raise (elevate) your knee above the level of your heart when you are sitting or lying down.  Sleep with a pillow under your knee.  Do not use any products that contain nicotine or tobacco, such as cigarettes, e-cigarettes, and chewing tobacco. These can delay healing. If you need help quitting, ask your health care provider.  If you are overweight, work with your health care provider and a dietitian to set a weight-loss goal that is healthy and reasonable for you. Extra weight can put pressure on your knee.  Keep all follow-up visits as told by your health care provider. This is important. Contact a health care provider if:  Your knee pain continues, changes, or gets worse.  You have a fever along with knee pain.  Your knee feels warm to the touch.  Your knee buckles or locks up. Get help right away if:  Your knee swells, and the swelling becomes worse.  You cannot move your knee.  You have severe pain in your knee. Summary  Acute knee pain can be caused by a fall, an injury, an infection, or damage, swelling, or irritation of the tissues that support your knee.  Your health care provider may perform tests to find out the cause of the pain.  Pay attention to any changes in your symptoms. Relieve your pain with rest, medicines, light activity, and use of ice.  Get help if your pain continues or becomes worse, your knee swells, or you cannot move your knee. This information is not intended to replace advice given to you by your health care provider. Make sure you discuss any questions you have with your health care provider. Document Revised: 10/04/2017 Document Reviewed: 10/04/2017 Elsevier Patient Education  [place].  Gout  Gout is painful  swelling of your joints. Gout is a type of arthritis. It is caused by having too much uric acid in your body. Uric acid is a chemical that is made when your body breaks down substances called purines. If your body has too much uric acid, sharp crystals can form and build up in your joints. This causes pain and swelling. Gout attacks can happen quickly and be very painful (acute gout). Over time, the attacks can affect more joints and happen more often (chronic gout). What are the causes?  Too much uric acid in your blood. This can happen because: ? Your kidneys do not remove enough uric acid from your blood. ? Your body makes too much uric acid. ? You eat too many foods that are high in purines. These foods include organ meats, some seafood, and beer.  Trauma or stress. What increases the risk?  Having a family history of gout.  Being female and middle-aged.  Being female and having gone through menopause.  Being very overweight (obese).  Drinking alcohol, especially beer.  Not having enough water in the body (being dehydrated).  Losing weight too quickly.  Having an organ transplant.  Having lead poisoning.  Taking certain medicines.  Having kidney disease.  Having a skin condition called psoriasis. What are the signs or symptoms? An attack of acute gout usually happens in just one joint. The most common place is the big toe. Attacks often start at night. Other joints that may be affected include joints of the feet, ankle, knee, fingers, wrist, or elbow. Symptoms of an attack may include:  Very bad pain.  Warmth.  Swelling.  Stiffness.  Shiny, red, or purple skin.  Tenderness. The affected joint may be very painful to touch.  Chills and fever. Chronic gout may cause symptoms more often. More joints may be involved. You may also have white or yellow lumps (tophi) on your hands or feet or in other areas near your joints. How is this treated?  Treatment for this  condition has two phases: treating an acute attack and preventing future attacks.  Acute gout treatment may include: ? NSAIDs. ? Steroids. These are taken by mouth or injected into a joint. ? Colchicine. This medicine relieves pain and swelling. It can be given by mouth or through an IV tube.  Preventive treatment may include: ? Taking small doses of NSAIDs or colchicine daily. ? Using a medicine that reduces uric acid levels in your blood. ? Making changes to your diet. You may need to see a food expert (dietitian) about what to eat and drink to prevent gout. Follow these instructions at home: During a gout attack   If told, put ice on the painful area: ? Put ice in a plastic bag. ? Place a towel between your skin and the bag. ? Leave the ice on for 20 minutes, 2-3 times a day.  Raise (  elevate) the painful joint above the level of your heart as often as you can.  Rest the joint as much as possible. If the joint is in your leg, you may be given crutches.  Follow instructions from your doctor about what you cannot eat or drink. Avoiding future gout attacks  Eat a low-purine diet. Avoid foods and drinks such as: ? Liver. ? Kidney. ? Anchovies. ? Asparagus. ? Herring. ? Mushrooms. ? Mussels. ? Beer.  Stay at a healthy weight. If you want to lose weight, talk with your doctor. Do not lose weight too fast.  Start or continue an exercise plan as told by your doctor. Eating and drinking  Drink enough fluids to keep your pee (urine) pale yellow.  If you drink alcohol: ? Limit how much you use to:  0-1 drink a day for women.  0-2 drinks a day for men. ? Be aware of how much alcohol is in your drink. In the [place] one drink equals one 12 oz bottle of beer (355 mL), one 5 oz glass of wine (148 mL), or one 1 oz glass of hard liquor (44 mL). General instructions  Take over-the-counter and prescription medicines only as told by your doctor.  Do not drive or use heavy  machinery while taking prescription pain medicine.  Return to your normal activities as told by your doctor. Ask your doctor what activities are safe for you.  Keep all follow-up visits as told by your doctor. This is important. Contact a doctor if:  You have another gout attack.  You still have symptoms of a gout attack after 10 days of treatment.  You have problems (side effects) because of your medicines.  You have chills or a fever.  You have burning pain when you pee (urinate).  You have pain in your lower back or belly. Get help right away if:  You have very bad pain.  Your pain cannot be controlled.  You cannot pee. Summary  Gout is painful swelling of the joints.  The most common site of pain is the big toe, but it can affect other joints.  Medicines and avoiding some foods can help to prevent and treat gout attacks. This information is not intended to replace advice given to you by your health care provider. Make sure you discuss any questions you have with your health care provider. Document Revised: 11/14/2017 Document Reviewed: 11/14/2017 Elsevier Patient Education  [place] A low-purine eating plan involves making food choices to limit your intake of purine. Purine is a kind of uric acid. Too much uric acid in your blood can cause certain conditions, such as gout and kidney stones. Eating a low-purine diet can help control these conditions. What are tips for following this plan? Reading food labels   Avoid foods with saturated or Trans fat.  Check the ingredient list of grains-based foods, such as bread and cereal, to make sure that they contain whole grains.  Check the ingredient list of sauces or soups to make sure they do not contain meat or fish.  When choosing soft drinks, check the ingredient list to make sure they do not contain high-fructose corn syrup. Shopping  Buy plenty of fresh fruits and  vegetables.  Avoid buying canned or fresh fish.  Buy dairy products labeled as low-fat or nonfat.  Avoid buying premade or processed foods. These foods are often high in fat, salt (sodium), and added sugar. Cooking  Use olive oil instead  of butter when cooking. Oils like olive oil, canola oil, and sunflower oil contain healthy fats. Meal planning  Learn which foods do or do not affect you. If you find out that a food tends to cause your gout symptoms to flare up, avoid eating that food. You can enjoy foods that do not cause problems. If you have any questions about a food item, talk with your dietitian or health care provider.  Limit foods high in fat, especially saturated fat. Fat makes it harder for your body to get rid of uric acid.  Choose foods that are lower in fat and are lean sources of protein. General guidelines  Limit alcohol intake to no more than 1 drink a day for nonpregnant women and 2 drinks a day for men. One drink equals 12 oz of beer, 5 oz of wine, or 1 oz of hard liquor. Alcohol can affect the way your body gets rid of uric acid.  Drink plenty of water to keep your urine clear or pale yellow. Fluids can help remove uric acid from your body.  If directed by your health care provider, take a vitamin C supplement.  Work with your health care provider and dietitian to develop a plan to achieve or maintain a healthy weight. Losing weight can help reduce uric acid in your blood. What foods are recommended? The items listed may not be a complete list. Talk with your dietitian about what dietary choices are best for you. Foods low in purines Foods low in purines do not need to be limited. These include:  All fruits.  All low-purine vegetables, pickles, and olives.  Breads, pasta, rice, cornbread, and popcorn. Cake and other baked goods.  All dairy foods.  Eggs, nuts, and nut butters.  Spices and condiments, such as salt, herbs, and vinegar.  Plant oils, butter,  and margarine.  Water, sugar-free soft drinks, tea, coffee, and cocoa.  Vegetable-based soups, broths, sauces, and gravies. Foods moderate in purines Foods moderate in purines should be limited to the amounts listed.   cup of asparagus, cauliflower, spinach, mushrooms, or green peas, each day.  2/3 cup uncooked oatmeal, each day.   cup dry wheat bran or wheat germ, each day.  2-3 ounces of meat or poultry, each day.  4-6 ounces of shellfish, such as crab, lobster, oysters, or shrimp, each day.  1 cup cooked beans, peas, or lentils, each day.  Soup, broths, or bouillon made from meat or fish. Limit these foods as much as possible. What foods are not recommended? The items listed may not be a complete list. Talk with your dietitian about what dietary choices are best for you. Limit your intake of foods high in purines, including:  Beer and other alcohol.  Meat-based gravy or sauce.  Canned or fresh fish, such as: ? Anchovies, sardines, herring, and tuna. ? Mussels and scallops. ? Codfish, trout, and haddock.  Berniece Salines.  Organ meats, such as: ? Liver or kidney. ? Tripe. ? Sweetbreads (thymus gland or pancreas).  [employer] biochemist.  Yeast or yeast extract supplements.  Drinks sweetened with high-fructose corn syrup. Summary  Eating a low-purine diet can help control conditions caused by too much uric acid in the body, such as gout or kidney stones.  Choose low-purine foods, limit alcohol, and limit foods high in fat.  You will learn over time which foods do or do not affect you. If you find out that a food tends to cause your gout symptoms to flare  up, avoid eating that food. This information is not intended to replace advice given to you by your health care provider. Make sure you discuss any questions you have with your health care provider. Document Revised: 04/06/2017 Document Reviewed: 06/07/2016 Elsevier Patient Education  2020 [employer].

## 2019-10-06 ENCOUNTER — Encounter: Payer: Self-pay | Admitting: Family Medicine

## 2019-10-06 NOTE — Progress Notes (Signed)
Subjective:    Patient ID: Morgan Clay, female    DOB: 1945/07/18, 73 y.o.   MRN: [id number]  No chief complaint on file.   HPI Pt is a 74 yo female with pmh sig for afib on eliquis, atrial flutter, diastolic CHF,  ICD in place, HTN, DM II with renal manifestation, LBBB, pulmonary HTN, gout, asthma, COPD, OSA, h/o GIB, h/o diverticulitis s/p resection, h/o hep C s/p Harvoni, hypothyroidism, osteopenia, h/o depression who was seen for ongoing concern.  Pt endorses lower extremity edema and weakness x months. Right leg greater than left with right knee swelling with walking. Unable to straighten R leg. Endorses h/o gout.  Denies recent ingestion of foods rich in purines, low back pain, numbness or tingling in LEs. States Tylenol and other over-the-counter medicines did not help.    Past Medical History:  Diagnosis Date  . A-fib ([place])    on Eliquis  . AICD (automatic cardioverter/defibrillator) present 10/06/2015  . Anemia   . ARF (acute renal failure) ([place])   . Arthritis    "qwhere" (01/03/2018)  . Asthma    reports mild asthma since childhood - had COPD on dx list from prior PCP  . Chronic bronchitis ([place])    "get it most years; not this past year though" (01/03/2018)  . Chronic kidney disease    "? stage;  followed by [facility]; [name]" (01/03/2018)  . Chronic systolic congestive heart failure ([place])   . DEPRESSION 12/17/2009   Annotation: PHQ-9 score = 14 done on 12/17/2009 Qualifier: Diagnosis of  By: Hassell Done FNP, Tori Milks    . Diabetes mellitus without complication (HCC)    DIET CONTROLLED   . Diverticulosis   . Enteritis   . FIBROIDS, UTERUS 03/05/2008  . GI bleeding 01/03/2018  . Glaucoma   . Gout   . Hepatitis C    HEPATITIS C - s/p treatment with Harvoni, saw hepatology, Dawn Drazek  . History of blood transfusion ~ 11/2017  . Hyperlipemia 12/06/2012  . HYPERTENSION, BENIGN 04/24/2007  . Hypothyroidism   . LBBB (left bundle branch block)   . Lower GI bleeding    "been dealing w/it since 07/2017" (01/03/2018)  . Nonischemic cardiomyopathy ([place])   . OBESITY 05/27/2009  . OBSTRUCTIVE SLEEP APNEA 11/14/2007   no CPAP  . OSTEOPENIA 09/30/2008  . Pain and swelling of left upper extremity 08/08/2017  . Personal history of other infectious and parasitic disease    Hepatitis B  . Pneumonia    "several times" (01/03/2018)  . Pulmonary edema, acute ([place]) 08/09/2017    No Known Allergies  ROS General: Denies fever, chills, night sweats, changes in weight, changes in appetite HEENT: Denies headaches, ear pain, changes in vision, rhinorrhea, sore throat CV: Denies CP, palpitations, SOB, orthopnea Pulm: Denies SOB, cough, wheezing GI: Denies abdominal pain, nausea, vomiting, diarrhea, constipation GU: Denies dysuria, hematuria, frequency, vaginal discharge Msk: Denies muscle cramps, joint pains  +R knee and leg pain.   Neuro: Denies weakness, numbness, tingling Skin: Denies rashes, bruising Psych: Denies depression, anxiety, hallucinations     Objective:    Blood pressure 138/60, pulse 64, temperature 98.4 F (36.9 C), temperature source Temporal, weight 148 lb 12.8 oz (67.5 kg), SpO2 97 %.  Gen. Seemingly irritated when asked questions, well-nourished, in no distress. HEENT: /AT, face symmetric, no scleral icterus, PERRLA, EOMI, nares patent without drainage Lungs: no accessory muscle use Cardiovascular: RRR, no peripheral edema Musculoskeletal: R knee edema, very small effusion, mildly increased warmth and erythema.  Pt unable to fully extend lower leg at the knee 2/2 discomfort.  No cyanosis or clubbing, normal tone Neuro:  A&Ox3, CN II-XII intact, normal gait  Wt Readings from Last 3 Encounters:  10/02/19 148 lb 12.8 oz (67.5 kg)  07/24/19 153 lb (69.4 kg)  07/23/19 155 lb (70.3 kg)    Lab Results  Component Value Date   WBC 5.4 10/02/2019   HGB 10.3 (L) 10/02/2019   HCT 31.9 (L) 10/02/2019   PLT 169.0 10/02/2019   GLUCOSE 113 (H)  10/02/2019   CHOL 89 07/23/2019   TRIG 97.0 07/23/2019   HDL 36.40 (L) 07/23/2019   LDLCALC 33 07/23/2019   ALT 23 03/05/2018   AST 29 03/05/2018   NA 135 10/02/2019   K 3.6 10/02/2019   CL 102 10/02/2019   CREATININE 1.69 (H) 10/02/2019   BUN 34 (H) 10/02/2019   CO2 27 10/02/2019   TSH 3.87 07/17/2019   INR 1.26 02/08/2018   HGBA1C 6.0 07/17/2019   MICROALBUR 7.7 (H) 11/24/2013    Assessment/Plan:  Acute pain of right knee  -likely 2/2 acute gout flair vs arthritis -discussed tx with prednisone, r/b/a.  Advised fsbs will likely become elevated - Plan: CBC with Differential/Platelet, DG Knee Complete 4 Views Right, predniSONE (DELTASONE) 10 MG tablet  Weakness of both lower extremities  -possibly 2/2 ongoing knee pain -consider referral to Ortho and PT - Plan: DG Knee Complete 4 Views Right, Basic metabolic panel  History of gout  - Plan: Uric Acid  F/u prn  Grier Mitts, MD

## 2019-10-07 ENCOUNTER — Other Ambulatory Visit: Payer: Self-pay | Admitting: *Deleted

## 2019-10-07 DIAGNOSIS — M25561 Pain in right knee: Secondary | ICD-10-CM

## 2019-10-07 DIAGNOSIS — M1711 Unilateral primary osteoarthritis, right knee: Secondary | ICD-10-CM

## 2019-10-07 DIAGNOSIS — M1712 Unilateral primary osteoarthritis, left knee: Secondary | ICD-10-CM

## 2019-10-13 ENCOUNTER — Ambulatory Visit (INDEPENDENT_AMBULATORY_CARE_PROVIDER_SITE_OTHER): Payer: Medicare Other

## 2019-10-13 ENCOUNTER — Telehealth: Payer: Self-pay

## 2019-10-13 DIAGNOSIS — I5032 Chronic diastolic (congestive) heart failure: Secondary | ICD-10-CM | POA: Diagnosis not present

## 2019-10-13 DIAGNOSIS — Z95810 Presence of automatic (implantable) cardiac defibrillator: Secondary | ICD-10-CM

## 2019-10-13 NOTE — Progress Notes (Signed)
EPIC Encounter for ICM Monitoring  Patient Name: Morgan Clay is a 74 y.o. female Date: 10/13/2019 Primary Care Physican: Billie Ruddy, MD Primary Cardiologist: [name] Electrophysiologist: Caryl Comes Nephrologist: [name] Bi-V Pacing:99.9% 5/4/2021Weight: 148 lbs  Clinical Status (09-Sep-2019 to 13-Oct-2019)  AT/AF             170 episode  Time in AT/AF 0.9 hr/day (3.6%) (taking Eliquis)  Longest AT/AF 57 minutes  Attempted call to patient and unable to reach.  Left detailed message per DPR regarding transmission. Transmission reviewed.   Optivol thoracic impedance suggesting possible fluid accumulation since 09/22/2019 with exception of 2-3 days at baseline.  Prescribed:  Torsemide20 mgTake 3 tablets (60 mg total) by mouth every morning AND 2 tablets (40 mg total) every evening.  Potassium 20 mEq take 2 tablets (40 mEq total) by mouth daily.  Metolazone 2.5 mg Take 1 tablet (2.5 mg total) by mouth as directed for 1 day. Then as directed by the CHF clinic  Eliquis 5 mg take 1 tablet twice a day  Labs: 07/23/2019 Creatinine1.89, BUN39, Potassium3.6, Sodium139, GFR31.68  07/17/2019 Creatinine1.82, BUN34, Potassium3.7, Sodium139, GFR33.09 01/02/2019 Creatinine 1.91, BUN 37, Potassium 4.2, Sodium 136, GFR 26-30 12/25/2018 Creatinine 1.98, BUN 38, Potassium 3.8, Sodium 140 A complete set of results can be found in Results Review.  Recommendations: Left voice mail with ICM number and encouraged to call if experiencing any fluid symptoms.  Follow-up plan: ICM clinic phone appointment on6/15/2021 (manual send)to recheck fluid levels. 91 day device clinic remote transmission6/29/2021.   Copy of ICM check sent to Dr.Klein and Dr [name] for review and recommendations if needed.  3 month ICM trend: 10/13/2019    1 Year ICM trend:       [name], RN 10/13/2019 1:04 PM

## 2019-10-13 NOTE — Telephone Encounter (Signed)
Remote ICM transmission received.  Attempted call to patient regarding ICM remote transmission and left detailed message per DPR.  Advised to return call for any fluid symptoms or questions.  

## 2019-10-15 ENCOUNTER — Encounter: Payer: Self-pay | Admitting: Family Medicine

## 2019-10-15 ENCOUNTER — Ambulatory Visit (INDEPENDENT_AMBULATORY_CARE_PROVIDER_SITE_OTHER): Payer: Medicare Other | Admitting: Family Medicine

## 2019-10-15 ENCOUNTER — Other Ambulatory Visit: Payer: Self-pay

## 2019-10-15 VITALS — BP 110/60 | HR 63 | Temp 97.0°F | Wt 147.2 lb

## 2019-10-15 DIAGNOSIS — M25561 Pain in right knee: Secondary | ICD-10-CM | POA: Diagnosis not present

## 2019-10-15 DIAGNOSIS — M25461 Effusion, right knee: Secondary | ICD-10-CM

## 2019-10-15 DIAGNOSIS — M1711 Unilateral primary osteoarthritis, right knee: Secondary | ICD-10-CM | POA: Diagnosis not present

## 2019-10-15 MED ORDER — TRAMADOL HCL 50 MG PO TABS: 50.0000 mg | ORAL_TABLET | Freq: Two times a day (BID) | ORAL | 0 refills | Status: AC | PRN

## 2019-10-15 NOTE — Patient Instructions (Addendum)
A prescription for tramadol was sent to your pharmacy to help with your knee pain.  Taking tramadol and trazodone can increase the chances of developing serotonin syndrome.  I have provided some information about serotonin syndrome in case she starts noticing symptoms.  If you develop symptoms stop taking the tramadol and proceed to the nearest emergency department for further evaluation.  Keep your appointment with Ortho on Monday.  You can always call their office to see if they will see you sooner.  The number to Ortho care office is [phone].  Acute Knee Pain, Adult Acute knee pain is sudden and may be caused by damage, swelling, or irritation of the muscles and tissues that support your knee. The injury may result from:  A fall.  An injury to your knee from twisting motions.  A hit to the knee.  Infection. Acute knee pain may go away on its own with time and rest. If it does not, your health care provider may order tests to find the cause of the pain. These may include:  Imaging tests, such as an X-ray, MRI, or ultrasound.  Joint aspiration. In this test, fluid is removed from the knee.  Arthroscopy. In this test, a lighted tube is inserted into the knee and an image is projected onto a TV screen.  Biopsy. In this test, a sample of tissue is removed from the body and studied under a microscope. Follow these instructions at home: Pay attention to any changes in your symptoms. Take these actions to relieve your pain. If you have a knee sleeve or brace:   Wear the sleeve or brace as told by your health care provider. Remove it only as told by your health care provider.  Loosen the sleeve or brace if your toes tingle, become numb, or turn cold and blue.  Keep the sleeve or brace clean.  If the sleeve or brace is not waterproof: ? Do not let it get wet. ? Cover it with a watertight covering when you take a bath or shower. Activity  Rest your knee.  Do not do things that  cause pain or make pain worse.  Avoid high-impact activities or exercises, such as running, jumping rope, or doing jumping jacks.  Work with a physical therapist to make a safe exercise program, as recommended by your health care provider. Do exercises as told by your physical therapist. Managing pain, stiffness, and swelling   If directed, put ice on the knee: ? Put ice in a plastic bag. ? Place a towel between your skin and the bag. ? Leave the ice on for 20 minutes, 2-3 times a day.  If directed, use an elastic bandage to put pressure (compression) on your injured knee. This may control swelling, give support, and help with discomfort. General instructions  Take over-the-counter and prescription medicines only as told by your health care provider.  Raise (elevate) your knee above the level of your heart when you are sitting or lying down.  Sleep with a pillow under your knee.  Do not use any products that contain nicotine or tobacco, such as cigarettes, e-cigarettes, and chewing tobacco. These can delay healing. If you need help quitting, ask your health care provider.  If you are overweight, work with your health care provider and a dietitian to set a weight-loss goal that is healthy and reasonable for you. Extra weight can put pressure on your knee.  Keep all follow-up visits as told by your health care provider. This is  important. Contact a health care provider if:  Your knee pain continues, changes, or gets worse.  You have a fever along with knee pain.  Your knee feels warm to the touch.  Your knee buckles or locks up. Get help right away if:  Your knee swells, and the swelling becomes worse.  You cannot move your knee.  You have severe pain in your knee. Summary  Acute knee pain can be caused by a fall, an injury, an infection, or damage, swelling, or irritation of the tissues that support your knee.  Your health care provider may perform tests to find out the  cause of the pain.  Pay attention to any changes in your symptoms. Relieve your pain with rest, medicines, light activity, and use of ice.  Get help if your pain continues or becomes worse, your knee swells, or you cannot move your knee. This information is not intended to replace advice given to you by your health care provider. Make sure you discuss any questions you have with your health care provider. Document Revised: 10/04/2017 Document Reviewed: 10/04/2017 Elsevier Patient Education  [place].  Knee Effusion Knee effusion refers to excess fluid in the knee joint. This can cause pain and swelling in your knee. Knee effusion creates more pressure than usual in your knee joint. This makes it more difficult for you to bend and move your knee. If there is fluid in your knee, it often means that something is wrong inside your knee. This can be a result of:  Severe arthritis.  Injury to the knee muscles, ligaments, or cartilage.  Infection.  Autoimmune disease. This means that your body's defense system (immune system) mistakenly attacks healthy body tissues. Follow these instructions at home: Medicines  Take over-the-counter and prescription medicines only as told by your health care provider.  Do not drive or use heavy machinery while taking prescription pain medicine.  If you are taking prescription pain medicine, take actions to prevent or treat constipation. Your health care provider may recommend that you: ? Drink enough fluid to keep your urine pale yellow. ? Eat foods that are high in fiber, such as fresh fruits and vegetables, whole grains, and beans. ? Limit foods that are high in fat and processed sugars, such as fried or sweet foods. ? Take an over-the-counter or prescription medicine for constipation. If you have a brace:  Wear the brace as told by your health care provider. Remove it only as told by your health care provider.  Loosen the brace if your toes  tingle, become numb, or turn cold and blue.  Keep the brace clean.  If the brace is not waterproof: ? Do not let it get wet. ? Cover it with a watertight covering when you take a bath or a shower. Managing pain, stiffness, and swelling   If directed, put ice on the swollen area: ? If you have a removable brace, remove it as told by your health care provider. ? Put ice in a plastic bag. ? Place a towel between your skin and the bag. ? Leave the ice on for 20 minutes, 2-3 times per day.  Raise (elevate) your knee at or above the level of your heart while you are sitting or lying down. General instructions  Do not use any products that contain nicotine or tobacco, such as cigarettes and e-cigarettes. These can delay healing. If you need help quitting, ask your health care provider.  Do not use the injured limb to  support your body weight until your health care provider says it is okay. Use crutches as told by your health care provider.  Do any rehabilitation or strengthening exercises as told by your health care provider.  Rest as told by your health care provider. ? Avoid sitting for a long time without moving. Get up to take short walks every 1-2 hours. This is important to improve blood flow and breathing. Ask for help if you feel weak or unsteady.  Keep all follow-up visits as told by your health care provider. This is important. Contact a health care provider if you:  Continue to have pain in your knee. Get help right away if you:  Have swelling or redness of your knee that gets worse or does not get better.  Have severe pain in your knee.  Have a fever. Summary  Knee effusion refers to excess fluid in the knee joint. This causes pain and swelling and makes it difficult to bend and move your knee.  Effusion may be caused by severe arthritis, autoimmune disease, infection, or injury to the knee muscles, ligaments, or cartilage.  Take over-the-counter and prescription  medicines only as told by your health care provider.  If you have a brace, wear the brace as told by your health care provider. This information is not intended to replace advice given to you by your health care provider. Make sure you discuss any questions you have with your health care provider. Document Revised: 01/11/2018 Document Reviewed: 05/06/2017 Elsevier Patient Education  2020 [place].  Osteoarthritis  Osteoarthritis is a type of arthritis that affects tissue that covers the ends of bones in joints (cartilage). Cartilage acts as a cushion between the bones and helps them move smoothly. Osteoarthritis results when cartilage in the joints gets worn down. Osteoarthritis is sometimes called "wear and tear" arthritis. Osteoarthritis is the most common form of arthritis. It often occurs in older people. It is a condition that gets worse over time (a progressive condition). Joints that are most often affected by this condition are in:  Fingers.  Toes.  Hips.  Knees.  Spine, including neck and lower back. What are the causes? This condition is caused by age-related wearing down of cartilage that covers the ends of bones. What increases the risk? The following factors may make you more likely to develop this condition:  Older age.  Being overweight or obese.  Overuse of joints, such as in athletes.  Past injury of a joint.  Past surgery on a joint.  Family history of osteoarthritis. What are the signs or symptoms? The main symptoms of this condition are pain, swelling, and stiffness in the joint. The joint may lose its shape over time. Small pieces of bone or cartilage may break off and float inside of the joint, which may cause more pain and damage to the joint. Small deposits of bone (osteophytes) may grow on the edges of the joint. Other symptoms may include:  A grating or scraping feeling inside the joint when you move it.  Popping or creaking sounds when you  move. Symptoms may affect one or more joints. Osteoarthritis in a major joint, such as your knee or hip, can make it painful to walk or exercise. If you have osteoarthritis in your hands, you might not be able to grip items, twist your hand, or control small movements of your hands and fingers (fine motor skills). How is this diagnosed? This condition may be diagnosed based on:  Your medical history.  A physical exam.  Your symptoms.  X-rays of the affected joint(s).  Blood tests to rule out other types of arthritis. How is this treated? There is no cure for this condition, but treatment can help to control pain and improve joint function. Treatment plans may include:  A prescribed exercise program that allows for rest and joint relief. You may work with a physical therapist.  A weight control plan.  Pain relief techniques, such as: ? Applying heat and cold to the joint. ? Electric pulses delivered to nerve endings under the skin (transcutaneous electrical nerve stimulation, or TENS). ? Massage. ? Certain nutritional supplements.  NSAIDs or prescription medicines to help relieve pain.  Medicine to help relieve pain and inflammation (corticosteroids). This can be given by mouth (orally) or as an injection.  Assistive devices, such as a brace, wrap, splint, specialized glove, or cane.  Surgery, such as: ? An osteotomy. This is done to reposition the bones and relieve pain or to remove loose pieces of bone and cartilage. ? Joint replacement surgery. You may need this surgery if you have very bad (advanced) osteoarthritis. Follow these instructions at home: Activity  Rest your affected joints as directed by your health care provider.  Do not drive or use heavy machinery while taking prescription pain medicine.  Exercise as directed. Your health care provider or physical therapist may recommend specific types of exercise, such as: ? Strengthening exercises. These are done to  strengthen the muscles that support joints that are affected by arthritis. They can be performed with weights or with exercise bands to add resistance. ? Aerobic activities. These are exercises, such as brisk walking or water aerobics, that get your heart pumping. ? Range-of-motion activities. These keep your joints easy to move. ? Balance and agility exercises. Managing pain, stiffness, and swelling      If directed, apply heat to the affected area as often as told by your health care provider. Use the heat source that your health care provider recommends, such as a moist heat pack or a heating pad. ? If you have a removable assistive device, remove it as told by your health care provider. ? Place a towel between your skin and the heat source. If your health care provider tells you to keep the assistive device on while you apply heat, place a towel between the assistive device and the heat source. ? Leave the heat on for 20-30 minutes. ? Remove the heat if your skin turns bright red. This is especially important if you are unable to feel pain, heat, or cold. You may have a greater risk of getting burned.  If directed, put ice on the affected joint: ? If you have a removable assistive device, remove it as told by your health care provider. ? Put ice in a plastic bag. ? Place a towel between your skin and the bag. If your health care provider tells you to keep the assistive device on during icing, place a towel between the assistive device and the bag. ? Leave the ice on for 20 minutes, 2-3 times a day. General instructions  Take over-the-counter and prescription medicines only as told by your health care provider.  Maintain a healthy weight. Follow instructions from your health care provider for weight control. These may include dietary restrictions.  Do not use any products that contain nicotine or tobacco, such as cigarettes and e-cigarettes. These can delay bone healing. If you need help  quitting, ask your health care provider.  Use assistive devices as directed by your health care provider.  Keep all follow-up visits as told by your health care provider. This is important. Where to find more information  [employer] of Arthritis and Musculoskeletal and Skin Diseases: www.niams.[employer].es  [employer] on Aging: http://kim-miller.com/  American College of Rheumatology: www.rheumatology.org Contact a health care provider if:  Your skin turns red.  You develop a rash.  You have pain that gets worse.  You have a fever along with joint or muscle aches. Get help right away if:  You lose a lot of weight.  You suddenly lose your appetite.  You have night sweats. Summary  Osteoarthritis is a type of arthritis that affects tissue covering the ends of bones in joints (cartilage).  This condition is caused by age-related wearing down of cartilage that covers the ends of bones.  The main symptom of this condition is pain, swelling, and stiffness in the joint.  There is no cure for this condition, but treatment can help to control pain and improve joint function. This information is not intended to replace advice given to you by your health care provider. Make sure you discuss any questions you have with your health care provider. Document Revised: 04/06/2017 Document Reviewed: 12/27/2015 Elsevier Patient Education  [place].  Serotonin Syndrome Serotonin is a chemical in your body (neurotransmitter) that helps to control several functions, such as:  Brain and nerve cell function.  Mood and emotions.  Memory.  Eating.  Sleeping.  Sexual activity.  Stress response. Having too much serotonin in your body can cause serotonin syndrome. This condition can be harmful to your brain and nerve cells. This can be a life-threatening condition. What are the causes? This condition may be caused by taking medicines or drugs that increase the level of  serotonin in your body, such as:  Antidepressant medicines.  Migraine medicines.  Certain pain medicines.  Certain drugs, including ecstasy, LSD, cocaine, and amphetamines.  Over-the-counter cough or cold medicines that contain dextromethorphan.  Certain herbal supplements, including St. John's wort, ginseng, and nutmeg. This condition usually occurs when you take these medicines or drugs in combination, but it can also happen with a high dose of a single medicine or drug. What increases the risk? You are more likely to develop this condition if:  You just started taking a medicine or drug that increases the level of serotonin in the body.  You recently increased the dose of a medicine or drug that increases the level of serotonin in the body.  You take more than one medicine or drug that increases the level of serotonin in the body. What are the signs or symptoms? Symptoms of this condition usually start within several hours of taking a medicine or drug. Symptoms may be mild or severe. Mild symptoms include:  Sweating.  Restlessness or agitation.  Muscle twitching or stiffness.  Rapid heart rate.  Nausea and vomiting.  Diarrhea.  Headache.  Shivering or goose bumps.  Confusion. Severe symptoms include:  Irregular heartbeat.  Seizures.  Loss of consciousness.  High fever. How is this diagnosed? This condition may be diagnosed based on:  Your medical history.  A physical exam.  Your prior use of drugs and medicines.  Blood or urine tests. These may be used to rule out other causes of your symptoms. How is this treated? The treatment for this condition depends on the severity of your symptoms.  For mild cases, stopping the medicine or drug that caused your  condition is usually all that is needed.  For moderate to severe cases, treatment in a hospital may be needed to prevent or manage life-threatening symptoms. This may include medicines to control  your symptoms, IV fluids, interventions to support your breathing, and treatments to control your body temperature. Follow these instructions at home: Medicines   Take over-the-counter and prescription medicines only as told by your health care provider. This is important.  Check with your health care provider before you start taking any new prescriptions, over-the-counter medicines, herbs, or supplements.  Avoid combining any medicines that can cause this condition to occur. Lifestyle   Maintain a healthy lifestyle. ? Eat a healthy diet that includes plenty of vegetables, fruits, whole grains, low-fat dairy products, and lean protein. Do not eat a lot of foods that are high in fat, added sugars, or salt. ? Get the right amount and quality of sleep. Most adults need 7-9 hours of sleep each night. ? Make time to exercise, even if it is only for short periods of time. Most adults should exercise for at least 150 minutes each week. ? Do not drink alcohol. ? Do not use illegal drugs, and do not take medicines for reasons other than they are prescribed. General instructions  Do not use any products that contain nicotine or tobacco, such as cigarettes and e-cigarettes. If you need help quitting, ask your health care provider.  Keep all follow-up visits as told by your health care provider. This is important. Contact a health care provider if:  Your symptoms do not improve or they get worse. Get help right away if you:  Have worsening confusion, severe headache, chest pain, high fever, seizures, or loss of consciousness.  Experience serious side effects of medicine, such as swelling of your face, lips, tongue, or throat.  Have serious thoughts about hurting yourself or others. These symptoms may represent a serious problem that is an emergency. Do not wait to see if the symptoms will go away. Get medical help right away. Call your local emergency services (911 in the [place] Do not drive  yourself to the hospital. If you ever feel like you may hurt yourself or others, or have thoughts about taking your own life, get help right away. You can go to your nearest emergency department or call:  Your local emergency services (911 in the [place]  A suicide crisis helpline, such as the [place] at 765-640-5423. This is open 24 hours a day. Summary  Serotonin is a brain chemical that helps to regulate the nervous system. High levels of serotonin in the body can cause serotonin syndrome, which is a very dangerous condition.  This condition may be caused by taking medicines or drugs that increase the level of serotonin in your body.  Treatment depends on the severity of your symptoms. For mild cases, stopping the medicine or drug that caused your condition is usually all that is needed.  Check with your health care provider before you start taking any new prescriptions, over-the-counter medicines, herbs, or supplements. This information is not intended to replace advice given to you by your health care provider. Make sure you discuss any questions you have with your health care provider. Document Revised: 06/01/2017 Document Reviewed: 06/01/2017 Elsevier Patient Education  2020 [employer].

## 2019-10-15 NOTE — Progress Notes (Signed)
Subjective:    Patient ID: Morgan Clay, female    DOB: 02/26/1946, 74 y.o.   MRN: [id number]  Chief Complaint  Patient presents with  . right leg pain    HPI Patient was seen today for ongoing concern of right knee pain.  Pt has an appointment with Ortho on Monday.  States she cannot wait until the appointment as her knee is making it difficult to walk.  Pt states she had improvement while on prednisone, with return of symptoms when med stopped.  Pt notes h/o knee pain x 2+ yrs.  Pt mentions her last gout flare was a few days ago with pain in bilateral great toes.  Since improving.  Pt inquires about pain medication until she can be seen by Ortho.  Pt less than pleasant/argumentative with this provider.  When asked questions about events from years ago when seen by another pcp replies "what you didn't review the chart?"    Past Medical History:  Diagnosis Date  . A-fib ([place])    on Eliquis  . AICD (automatic cardioverter/defibrillator) present 10/06/2015  . Anemia   . ARF (acute renal failure) ([place])   . Arthritis    "qwhere" (01/03/2018)  . Asthma    reports mild asthma since childhood - had COPD on dx list from prior PCP  . Chronic bronchitis ([place])    "get it most years; not this past year though" (01/03/2018)  . Chronic kidney disease    "? stage;  followed by [facility]; [name]" (01/03/2018)  . Chronic systolic congestive heart failure ([place])   . DEPRESSION 12/17/2009   Annotation: PHQ-9 score = 14 done on 12/17/2009 Qualifier: Diagnosis of  By: Hassell Done FNP, Tori Milks    . Diabetes mellitus without complication (HCC)    DIET CONTROLLED   . Diverticulosis   . Enteritis   . FIBROIDS, UTERUS 03/05/2008  . GI bleeding 01/03/2018  . Glaucoma   . Gout   . Hepatitis C    HEPATITIS C - s/p treatment with Harvoni, saw hepatology, Dawn Drazek  . History of blood transfusion ~ 11/2017  . Hyperlipemia 12/06/2012  . HYPERTENSION, BENIGN 04/24/2007  . Hypothyroidism   . LBBB (left  bundle branch block)   . Lower GI bleeding    "been dealing w/it since 07/2017" (01/03/2018)  . Nonischemic cardiomyopathy ([place])   . OBESITY 05/27/2009  . OBSTRUCTIVE SLEEP APNEA 11/14/2007   no CPAP  . OSTEOPENIA 09/30/2008  . Pain and swelling of left upper extremity 08/08/2017  . Personal history of other infectious and parasitic disease    Hepatitis B  . Pneumonia    "several times" (01/03/2018)  . Pulmonary edema, acute ([place]) 08/09/2017    No Known Allergies  ROS General: Denies fever, chills, night sweats, changes in weight, changes in appetite HEENT: Denies headaches, ear pain, changes in vision, rhinorrhea, sore throat CV: Denies CP, palpitations, SOB, orthopnea Pulm: Denies SOB, cough, wheezing GI: Denies abdominal pain, nausea, vomiting, diarrhea, constipation GU: Denies dysuria, hematuria, frequency, vaginal discharge Msk: Denies muscle cramps, joint pains  +R knee pain and edema Neuro: Denies weakness, numbness, tingling Skin: Denies rashes, bruising Psych: Denies depression, anxiety, hallucinations    Objective:    Blood pressure 110/60, pulse 63, temperature (!) 97 F (36.1 C), temperature source Temporal, weight 147 lb 3.2 oz (66.8 kg), SpO2 96 %.  Gen. Well-nourished, in no distress, normal affect  HEENT: [place]/AT, face symmetric, no scleral icterus, PERRLA, EOMI, nares patent without drainage Lungs: no accessory  muscle use Cardiovascular: RRR, no peripheral edema. Musculoskeletal: R knee with mildly increased warmth and small effusion.  Decreased ROM.  No deformities, no cyanosis or clubbing, normal tone Neuro:  A&Ox3, CN II-XII intact,gait not assessed as patient sitting in wheelchair. Skin:  Warm, no lesions/ rash   Wt Readings from Last 3 Encounters:  10/15/19 147 lb 3.2 oz (66.8 kg)  10/02/19 148 lb 12.8 oz (67.5 kg)  07/24/19 153 lb (69.4 kg)    Lab Results  Component Value Date   WBC 5.4 10/02/2019   HGB 10.3 (L) 10/02/2019   HCT 31.9 (L) 10/02/2019    PLT 169.0 10/02/2019   GLUCOSE 113 (H) 10/02/2019   CHOL 89 07/23/2019   TRIG 97.0 07/23/2019   HDL 36.40 (L) 07/23/2019   LDLCALC 33 07/23/2019   ALT 23 03/05/2018   AST 29 03/05/2018   NA 135 10/02/2019   K 3.6 10/02/2019   CL 102 10/02/2019   CREATININE 1.69 (H) 10/02/2019   BUN 34 (H) 10/02/2019   CO2 27 10/02/2019   TSH 3.87 07/17/2019   INR 1.26 02/08/2018   HGBA1C 6.0 07/17/2019   MICROALBUR 7.7 (H) 11/24/2013   Procedure Note: joint aspiration, knee After consent was obtained, using sterile technique the R knee was prepped and cold spray used to anesthetize the needle tract into the joint from the medial infrapatellar approach. The knee joint was entered.  Initially tolerated well, however no fluid withdrawn as pt requested a break.  Procedure stopped.   Assessment/Plan:  Recurrent pain of right knee  -X-ray right knee on 10/02/2019 with small joint effusion.  Osteoarthritic change medially and patellofemoral joint region. --pt assisted via wheelchair to the car by RN.  Pt stated she is needs something other that tramadol and she will not be picking up the rx. -Given precautions of possible serotonin syndrome as taking trazodone nightly.  Given handout on serotonin syndrome so patient will be aware of possible symptoms. - Plan: traMADol (ULTRAM) 50 MG tablet  Osteoarthritis of right knee, unspecified osteoarthritis type  - Plan: traMADol (ULTRAM) 50 MG tablet  Effusion of right knee -Pt given option of aspiration in clinic versus closer follow-up with Ortho/walk-in emergency ortho clinic for possible aspiration then.  Pt becomes upset states "aren't you the doctor".  "Why would I drive somewhere else that he barely drive now because my knee". -Consent obtained.  Aspiration attempted however procedure stopped 2/2 patient discomfort. -Patient advised to follow-up with Ortho.  Given ongoing behavior at appointments, may be beneficial for patient to find a new PCP.  This  provider attempted to explain to pt every attempt was being made to address her concerns.  Grier Mitts, MD

## 2019-10-16 ENCOUNTER — Ambulatory Visit: Payer: Medicare Other | Admitting: Surgery

## 2019-10-16 ENCOUNTER — Encounter: Payer: Self-pay | Admitting: Surgery

## 2019-10-16 VITALS — BP 108/57 | HR 64 | Ht 59.0 in | Wt 147.0 lb

## 2019-10-16 DIAGNOSIS — G8929 Other chronic pain: Secondary | ICD-10-CM

## 2019-10-16 DIAGNOSIS — M1A0610 Idiopathic chronic gout, right knee, without tophus (tophi): Secondary | ICD-10-CM

## 2019-10-16 DIAGNOSIS — M25561 Pain in right knee: Secondary | ICD-10-CM

## 2019-10-16 MED ORDER — METHYLPREDNISOLONE ACETATE 40 MG/ML IJ SUSP
80.0000 mg | INTRAMUSCULAR | Status: AC | PRN
  Administered 2019-10-16: 80 mg via INTRA_ARTICULAR

## 2019-10-16 MED ORDER — BUPIVACAINE HCL 0.25 % IJ SOLN
6.0000 mL | INTRAMUSCULAR | Status: AC | PRN
  Administered 2019-10-16: 6 mL via INTRA_ARTICULAR

## 2019-10-16 MED ORDER — LIDOCAINE HCL 1 % IJ SOLN
3.0000 mL | INTRAMUSCULAR | Status: AC | PRN
  Administered 2019-10-16: 3 mL

## 2019-10-16 NOTE — Progress Notes (Signed)
Office Visit Note   Patient: Morgan Clay           Date of Birth: July 30, 1945           MRN: [id number] Visit Date: 10/16/2019              Requested by: Billie Ruddy, MD [place],  [place] [id number] PCP: Billie Ruddy, MD   Assessment & Plan: Visit Diagnoses:  1. Chronic pain of right knee   2. Idiopathic chronic gout of right knee without tophus     Plan: Note given patient quickly for her knee pain offered injection.  After patient consent right knee was prepped Betadine and after using Xylocaine aspiration was performed and then I injected Marcaine/Depo-Medrol from a superior lateral patellar approach.  Aspirated about 25 cc of cloudy serous fluid.  Fluid was sent to the lab for analysis.  Also did blood work to check ANA and rheumatoid factor.  She had a CBC and uric acid a couple weeks ago.  Follow-Up Instructions: Return in about 2 weeks (around 10/30/2019) for with Ardell Makarewicz recheck right knee. .   Orders:  Orders Placed This Encounter  Procedures  . Large Joint Inj  . Antinuclear Antib (ANA)  . Rheumatoid Factor   No orders of the defined types were placed in this encounter.     Procedures: Large Joint Inj on 10/16/2019 12:01 PM Details: 22 G 1.5 in needle, superolateral approach Medications: 3 mL lidocaine 1 %; 6 mL bupivacaine 0.25 %; 80 mg methylPREDNISolone acetate 40 MG/ML  Patient tolerated procedure well without complication.  I aspirated about 25 cc of serous cloudy fluid.  Fluid was sent to the lab for analysis. Consent was given by the patient. Patient was prepped and draped in the usual sterile fashion.       Clinical Data: No additional findings.   Subjective: Chief Complaint  Patient presents with  . Right Knee - Pain    HPI 74 year old black female comes in today with complaints of acute on chronic right knee pain.  Patient states that she has a known history of gout.  She has seen [facility] orthopedics a couple  years ago and had knee injection at that time.  This gave temporary relief.  Primary care physician has been following her for her gout and right knee pain over the last several weeks.  Was seen by PCP yesterday.  Had blood work done 02 Oct 2019 with uric acid elevated at 12.1.  Patient started a oral prednisone taper Oct 02, 2019 and this did help with her pain temporarily.  States that PCP did attempt to do an injection yesterday.  Has diffuse right knee pain.  No complaints of fever chills.  Pain when she is up and ambulating and bending her knee.  She has not had an MRI scan. Review of Systems No current cardiac pulmonary GI GU issues  Objective: Vital Signs: BP (!) 108/57   Pulse 64   Ht 4\' 11"  (1.499 m)   Wt 147 lb (66.7 kg)   BMI 29.69 kg/m   Physical Exam HENT:     Head: Normocephalic.  Eyes:     Extraocular Movements: Extraocular movements intact.     Pupils: Pupils are equal, round, and reactive to light.  Pulmonary:     Effort: No respiratory distress.  Musculoskeletal:     Comments: Gait is considerably antalgic.  Bilateral hips unremarkable.  Right knee range of motion about 10 to  90 degrees with marked discomfort.  Knee swelling with positive effusion.  Knee is diffusely tender.  Slight increased warmth.  No signs of infection.  Calf nontender.  Neurological:     General: No focal deficit present.     Mental Status: She is alert and oriented to person, place, and time.  Psychiatric:        Mood and Affect: Mood normal.     Ortho Exam  Specialty Comments:  No specialty comments available.  Imaging: No results found.   PMFS History: Patient Active Problem List   Diagnosis Date Noted  . CHF (congestive heart failure) ([place]) 07/23/2019  . Hepatitis C   . Gout   . Anemia in chronic kidney disease 01/18/2018  . Pulmonary hypertension ([place]) on echocardiogram 01/14/2018  . Angiodysplasia of intestinal tract 01/14/2018  . Polyp of transverse colon   .  Diverticulosis of colon without hemorrhage   . Atrial fibrillation, chronic 10/25/2017  . Aortic atherosclerosis ([place]) 08/30/2017  . Diverticulitis of left colon status post robotic low anterior to sigmoid resection 05/22/2018 07/31/2017  . Hypertension associated with diabetes ([place]) 06/07/2017  . MDD (major depressive disorder), recurrent, in partial remission ([place]) 06/07/2017  . Asthma 05/22/2017  . Chronic obstructive pulmonary disease ([place])   . Atypical atrial flutter ([place]) 09/28/2015  . Type 2 diabetes, controlled, with renal manifestation ([place]) 11/24/2013  . CKD (chronic kidney disease) stage 4, GFR 15-29 ml/min  11/24/2013  . Nonischemic cardiomyopathy ([place]) 11/22/2010  . Obesity 05/27/2009  . OSTEOPENIA 09/30/2008  . FIBROIDS, UTERUS 03/05/2008  . OBSTRUCTIVE SLEEP APNEA 11/14/2007  . Chronic diastolic CHF (congestive heart failure) ([place]) 04/24/2007  . Hypothyroidism 01/28/2007  . Biventricular ICD (implantable cardioverter-defibrillator) in place 01/08/2007   Past Medical History:  Diagnosis Date  . A-fib ([place])    on Eliquis  . AICD (automatic cardioverter/defibrillator) present 10/06/2015  . Anemia   . ARF (acute renal failure) ([place])   . Arthritis    "qwhere" (01/03/2018)  . Asthma    reports mild asthma since childhood - had COPD on dx list from prior PCP  . Chronic bronchitis ([place])    "get it most years; not this past year though" (01/03/2018)  . Chronic kidney disease    "? stage;  followed by [facility]; [name]" (01/03/2018)  . Chronic systolic congestive heart failure ([place])   . DEPRESSION 12/17/2009   Annotation: PHQ-9 score = 14 done on 12/17/2009 Qualifier: Diagnosis of  By: Hassell Done FNP, Tori Milks    . Diabetes mellitus without complication (HCC)    DIET CONTROLLED   . Diverticulosis   . Enteritis   . FIBROIDS, UTERUS 03/05/2008  . GI bleeding 01/03/2018  . Glaucoma   . Gout   . Hepatitis C    HEPATITIS C - s/p treatment with Harvoni, saw hepatology,  Dawn Drazek  . History of blood transfusion ~ 11/2017  . Hyperlipemia 12/06/2012  . HYPERTENSION, BENIGN 04/24/2007  . Hypothyroidism   . LBBB (left bundle branch block)   . Lower GI bleeding    "been dealing w/it since 07/2017" (01/03/2018)  . Nonischemic cardiomyopathy ([place])   . OBESITY 05/27/2009  . OBSTRUCTIVE SLEEP APNEA 11/14/2007   no CPAP  . OSTEOPENIA 09/30/2008  . Pain and swelling of left upper extremity 08/08/2017  . Personal history of other infectious and parasitic disease    Hepatitis B  . Pneumonia    "several times" (01/03/2018)  . Pulmonary edema, acute ([place]) 08/09/2017    Family History  Problem Relation Age of Onset  . Asthma Father   . Heart attack Father   . Asthma Sister   . Lung cancer Sister   . Heart attack Mother   . Stroke Brother   . Colon cancer Neg Hx   . Esophageal cancer Neg Hx   . Pancreatic cancer Neg Hx   . Stomach cancer Neg Hx   . Liver disease Neg Hx     Past Surgical History:  Procedure Laterality Date  . APPENDECTOMY    . CARDIAC CATHETERIZATION    . CARDIOVERSION N/A 12/14/2014   Procedure: CARDIOVERSION;  Surgeon: Dorothy Spark, MD;  Location[facility] ENDOSCOPY;  Service: Cardiovascular;  Laterality: N/A;  . CARDIOVERSION N/A 02/26/2017   Procedure: CARDIOVERSION;  Surgeon: Evans Lance, MD;  Location: [facility] CV LAB;  Service: Cardiovascular;  Laterality: N/A;  . CARDIOVERSION N/A 07/24/2017   Procedure: CARDIOVERSION;  Surgeon: Thayer Headings, MD;  Location: [facility] ENDOSCOPY;  Service: Cardiovascular;  Laterality: N/A;  . COLONOSCOPY N/A 11/08/2017   Procedure: COLONOSCOPY;  Surgeon: Milus Banister, MD;  Location: [facility] ENDOSCOPY;  Service: Endoscopy;  Laterality: N/A;  . DILATION AND CURETTAGE OF UTERUS    . EP IMPLANTABLE DEVICE N/A 10/06/2015   Procedure: BIV ICD Generator Changeout;  Surgeon: [name], MD;  Location: [place] CV LAB;  Service: Cardiovascular;  Laterality: N/A;  . ESOPHAGOGASTRODUODENOSCOPY N/A 10/26/2017    Procedure: ESOPHAGOGASTRODUODENOSCOPY (EGD);  Surgeon: Ladene Artist, MD;  Location: [facility] ENDOSCOPY;  Service: Endoscopy;  Laterality: N/A;  . ESOPHAGOGASTRODUODENOSCOPY (EGD) WITH PROPOFOL N/A 11/07/2017   Procedure: ESOPHAGOGASTRODUODENOSCOPY (EGD) WITH PROPOFOL;  Surgeon: Milus Banister, MD;  Location: [facility] ENDOSCOPY;  Service: Endoscopy;  Laterality: N/A;  . HOT HEMOSTASIS N/A 10/26/2017   Procedure: HOT HEMOSTASIS (ARGON PLASMA COAGULATION/BICAP);  Surgeon: Ladene Artist, MD;  Location: [facility] ENDOSCOPY;  Service: Endoscopy;  Laterality: N/A;  . INSERT / REPLACE / REMOVE PACEMAKER  ?2008  . POLYPECTOMY  11/08/2017   Procedure: POLYPECTOMY;  Surgeon: Milus Banister, MD;  Location: [facility] ENDOSCOPY;  Service: Endoscopy;;  . PROCTOSCOPY N/A 05/22/2018   Procedure: RIGID PROCTOSCOPY;  Surgeon: Michael Boston, MD;  Location: [facility] ORS;  Service: General;  Laterality: N/A;  . RIGHT HEART CATH N/A 02/13/2018   Procedure: RIGHT HEART CATH;  Surgeon: [name], MD;  Location: [place] CV LAB;  Service: Cardiovascular;  Laterality: N/A;  . TUBAL LIGATION    . XI ROBOTIC ASSISTED LOWER ANTERIOR RESECTION N/A 05/22/2018   Procedure: XI ROBOTIC ASSISTED SIGMOID COLOECTOMY MOBILIZATION OF SPLENIC FLEXURE, FIREFLY ASSESSMENT OF PERFUSION;  Surgeon: Michael Boston, MD;  Location: [facility] ORS;  Service: General;  Laterality: N/A;  ERAS PATHWAY   Social History   Occupational History  . Not on file  Tobacco Use  . Smoking status: Never Smoker  . Smokeless tobacco: Never Used  Vaping Use  . Vaping Use: Never used  Substance and Sexual Activity  . Alcohol use: Yes    Comment: 01/03/2018 "couple drinks/month"  . Drug use: Yes    Types: Marijuana    Comment: VERY INTERMITTENT   . Sexual activity: Not Currently

## 2019-10-16 NOTE — Progress Notes (Signed)
Take torsemide 60 mg bid for 3 days then back to 60/40.

## 2019-10-17 LAB — RHEUMATOID FACTOR: Rheumatoid fact SerPl-aCnc: <14 IU/mL (ref <14)

## 2019-10-17 LAB — ANA: Anti Nuclear Antibody (ANA): NEGATIVE

## 2019-10-17 NOTE — Progress Notes (Signed)
Attempted ICM call to patient's home and cell number to advise of Dr Claris Gladden recommendations.  Left message on home phone to return call.

## 2019-10-20 ENCOUNTER — Ambulatory Visit: Payer: Medicare Other | Admitting: Orthopedic Surgery

## 2019-10-20 NOTE — Progress Notes (Signed)
No return call from patient and unable to advise of Dr Claris Gladden recommendations.

## 2019-10-22 LAB — ANAEROBIC AND AEROBIC CULTURE
AER RESULT:: NO GROWTH
MICRO NUMBER:: 10576351
MICRO NUMBER:: 10576352
SPECIMEN QUALITY:: ADEQUATE
SPECIMEN QUALITY:: ADEQUATE

## 2019-10-22 LAB — SYNOVIAL CELL COUNT + DIFF, W/ CRYSTALS
Basophils, %: 0 %
Eosinophils-Synovial: 0 % (ref 0–2)
Lymphocytes-Synovial Fld: 16 % (ref 0–74)
Monocyte/Macrophage: 12 % (ref 0–69)
Neutrophil, Synovial: 72 % — ABNORMAL HIGH (ref 0–24)
Synoviocytes, %: 0 % (ref 0–15)
WBC, Synovial: 8220 cells/uL — ABNORMAL HIGH (ref <150)

## 2019-10-23 ENCOUNTER — Other Ambulatory Visit: Payer: Self-pay | Admitting: Family Medicine

## 2019-10-29 ENCOUNTER — Ambulatory Visit (INDEPENDENT_AMBULATORY_CARE_PROVIDER_SITE_OTHER): Payer: Medicare Other | Admitting: Orthopaedic Surgery

## 2019-10-29 ENCOUNTER — Encounter: Payer: Self-pay | Admitting: Orthopaedic Surgery

## 2019-10-29 VITALS — BP 142/81 | HR 81 | Ht 59.0 in | Wt 143.0 lb

## 2019-10-29 DIAGNOSIS — M10361 Gout due to renal impairment, right knee: Secondary | ICD-10-CM

## 2019-10-29 NOTE — Progress Notes (Signed)
No ICM remote transmission received for 10/21/2019 and next ICM transmission scheduled for 11/04/2019.

## 2019-10-29 NOTE — Progress Notes (Signed)
Office Visit Note   Patient: Morgan Clay           Date of Birth: [birth year]           MRN: [id number] Visit Date: 10/29/2019              Requested by: Billie Ruddy, MD [place],  [place] [address] PCP: Billie Ruddy, MD   Assessment & Plan: Visit Diagnoses:  1. Gout of right knee due to renal impairment, unspecified chronicity     Plan: Patient can talk to her nephrologist [name] about starting Uloric or possibly low-dose allopurinol.  Follow-Up Instructions: No follow-ups on file.   Orders:  No orders of the defined types were placed in this encounter.  No orders of the defined types were placed in this encounter.     Procedures: No procedures performed   Clinical Data: No additional findings.   Subjective: Chief Complaint  Patient presents with   Right Knee - Pain, Follow-up    HPI 74 year old female with stage III kidney disease had knee aspiration done on last office visit.  No crystals were seen white blood count was 8,220.  ANA was negative rheumatoid factor negative.  Of note in her chart was 10/02/2019 uric acid of 12.1.  Patient has past history of problems with great toe first MTP joint pain swelling redness difficulty walking she has had a walker that she is used for couple years.  She has had recurrent problems with her right knee more severe recently.  No crystals were seen on her aspirate however I discussed with patient today we will send patient to have gout but is not picked up on microscope analysis at our labs.  Patient reported couple weeks of improvement in her knee with the injection but states that now is worn off.  She does have positive history of gout.  Additionally she has got a pacemaker defibrillator problems with pulmonary hypertension heart failure stage IV kidney disease COPD major depression disorder.  Review of Systems review of systems negative as it pertains HPI other than the mentioned  above.   Objective: Vital Signs: BP (!) 142/81    Pulse 81    Ht 4\' 11"  (1.499 m)    Wt 143 lb (64.9 kg)    BMI 28.88 kg/m   Physical Exam Constitutional:      Appearance: She is well-developed.  HENT:     Head: Normocephalic.     Right Ear: External ear normal.     Left Ear: External ear normal.  Eyes:     Pupils: Pupils are equal, round, and reactive to light.  Neck:     Thyroid: No thyromegaly.     Trachea: No tracheal deviation.  Cardiovascular:     Rate and Rhythm: Normal rate.  Pulmonary:     Effort: Pulmonary effort is normal.  Abdominal:     Palpations: Abdomen is soft.  Skin:    General: Skin is warm and dry.  Neurological:     Mental Status: She is alert and oriented to person, place, and time.  Psychiatric:        Behavior: Behavior normal.     Ortho Exam patient is walking without a walker she has a walker at home.  Slow short stride gait.  There is mild knee effusion noted right knee.  This certainly is not tense there is no increased warmth.  First MTP joint shows mild tenderness slight decreased range of motion  right and left but no active podagra. Specialty Comments:  No specialty comments available.  Imaging: No results found.   PMFS History: Patient Active Problem List   Diagnosis Date Noted   CHF (congestive heart failure) ([place]) 07/23/2019   Hepatitis C    Gout    Anemia in chronic kidney disease 01/18/2018   Pulmonary hypertension ([place]) on echocardiogram 01/14/2018   Angiodysplasia of intestinal tract 01/14/2018   Polyp of transverse colon    Diverticulosis of colon without hemorrhage    Atrial fibrillation, chronic 10/25/2017   Aortic atherosclerosis ([place]) 08/30/2017   Diverticulitis of left colon status post robotic low anterior to sigmoid resection 05/22/2018 07/31/2017   Hypertension associated with diabetes ([place]) 06/07/2017   MDD (major depressive disorder), recurrent, in partial remission ([place]) 06/07/2017   Asthma  05/22/2017   Chronic obstructive pulmonary disease ([place])    Atypical atrial flutter ([place]) 09/28/2015   Type 2 diabetes, controlled, with renal manifestation ([place]) 11/24/2013   CKD (chronic kidney disease) stage 4, GFR 15-29 ml/min  11/24/2013   Nonischemic cardiomyopathy ([place]) 11/22/2010   Obesity 05/27/2009   OSTEOPENIA 09/30/2008   FIBROIDS, UTERUS 03/05/2008   OBSTRUCTIVE SLEEP APNEA 11/14/2007   Chronic diastolic CHF (congestive heart failure) ([place]) 04/24/2007   Hypothyroidism 01/28/2007   Biventricular ICD (implantable cardioverter-defibrillator) in place 01/08/2007   Past Medical History:  Diagnosis Date   A-fib ([place])    on Eliquis   AICD (automatic cardioverter/defibrillator) present 10/06/2015   Anemia    ARF (acute renal failure) ([place])    Arthritis    "qwhere" (01/03/2018)   Asthma    reports mild asthma since childhood - had COPD on dx list from prior PCP   Chronic bronchitis ([place])    "get it most years; not this past year though" (01/03/2018)   Chronic kidney disease    "? stage;  followed by [facility]; [name]" (01/03/2018)   Chronic systolic congestive heart failure ([place])    DEPRESSION 12/17/2009   Annotation: PHQ-9 score = 14 done on 12/17/2009 Qualifier: Diagnosis of  By: Hassell Done FNP, [place]     Diabetes mellitus without complication ([place])    DIET CONTROLLED    Diverticulosis    Enteritis    FIBROIDS, UTERUS 03/05/2008   GI bleeding 01/03/2018   Glaucoma    Gout    Hepatitis C    HEPATITIS C - s/p treatment with Harvoni, saw hepatology, Dawn Drazek   History of blood transfusion ~ 11/2017   Hyperlipemia 12/06/2012   HYPERTENSION, BENIGN 04/24/2007   Hypothyroidism    LBBB (left bundle branch block)    Lower GI bleeding    "been dealing w/it since 07/2017" (01/03/2018)   Nonischemic cardiomyopathy ([place])    OBESITY 05/27/2009   OBSTRUCTIVE SLEEP APNEA 11/14/2007   no CPAP   OSTEOPENIA 09/30/2008   Pain and  swelling of left upper extremity 08/08/2017   Personal history of other infectious and parasitic disease    Hepatitis B   Pneumonia    "several times" (01/03/2018)   Pulmonary edema, acute ([place]) 08/09/2017    Family History  Problem Relation Age of Onset   Asthma Father    Heart attack Father    Asthma Sister    Lung cancer Sister    Heart attack Mother    Stroke Brother    Colon cancer Neg Hx    Esophageal cancer Neg Hx    Pancreatic cancer Neg Hx    Stomach cancer Neg Hx  Liver disease Neg Hx     Past Surgical History:  Procedure Laterality Date   APPENDECTOMY     CARDIAC CATHETERIZATION     CARDIOVERSION N/A 12/14/2014   Procedure: CARDIOVERSION;  Surgeon: Dorothy Spark, MD;  Location: [facility];  Service: Cardiovascular;  Laterality: N/A;   CARDIOVERSION N/A 02/26/2017   Procedure: CARDIOVERSION;  Surgeon: Evans Lance, MD;  Location: [facility];  Service: Cardiovascular;  Laterality: N/A;   CARDIOVERSION N/A 07/24/2017   Procedure: CARDIOVERSION;  Surgeon: Acie Fredrickson Wonda Cheng, MD;  Location: [facility];  Service: Cardiovascular;  Laterality: N/A;   COLONOSCOPY N/A 11/08/2017   Procedure: COLONOSCOPY;  Surgeon: Milus Banister, MD;  Location: [facility];  Service: Endoscopy;  Laterality: N/A;   DILATION AND CURETTAGE OF UTERUS     EP IMPLANTABLE DEVICE N/A 10/06/2015   Procedure: BIV ICD Generator Changeout;  Surgeon: [name], MD;  Location: [facility];  Service: Cardiovascular;  Laterality: N/A;   ESOPHAGOGASTRODUODENOSCOPY N/A 10/26/2017   Procedure: ESOPHAGOGASTRODUODENOSCOPY (EGD);  Surgeon: Ladene Artist, MD;  Location: [facility];  Service: Endoscopy;  Laterality: N/A;   ESOPHAGOGASTRODUODENOSCOPY (EGD) WITH PROPOFOL N/A 11/07/2017   Procedure: ESOPHAGOGASTRODUODENOSCOPY (EGD) WITH PROPOFOL;  Surgeon: Milus Banister, MD;  Location: [facility];  Service: Endoscopy;  Laterality: N/A;   HOT HEMOSTASIS N/A  10/26/2017   Procedure: HOT HEMOSTASIS (ARGON PLASMA COAGULATION/BICAP);  Surgeon: Ladene Artist, MD;  Location: [facility];  Service: Endoscopy;  Laterality: N/A;   INSERT / REPLACE / REMOVE PACEMAKER  ?2008   POLYPECTOMY  11/08/2017   Procedure: POLYPECTOMY;  Surgeon: Milus Banister, MD;  Location: [facility];  Service: Endoscopy;;   PROCTOSCOPY N/A 05/22/2018   Procedure: RIGID PROCTOSCOPY;  Surgeon: Michael Boston, MD;  Location: [facility];  Service: General;  Laterality: N/A;   RIGHT HEART CATH N/A 02/13/2018   Procedure: RIGHT HEART CATH;  Surgeon: [name], MD;  Location: [facility];  Service: Cardiovascular;  Laterality: N/A;   TUBAL LIGATION     XI ROBOTIC ASSISTED LOWER ANTERIOR RESECTION N/A 05/22/2018   Procedure: XI ROBOTIC ASSISTED SIGMOID COLOECTOMY MOBILIZATION OF SPLENIC FLEXURE, FIREFLY ASSESSMENT OF PERFUSION;  Surgeon: Michael Boston, MD;  Location: [facility];  Service: General;  Laterality: N/A;  ERAS PATHWAY   Social History   Occupational History   Not on file  Tobacco Use   Smoking status: Never Smoker   Smokeless tobacco: Never Used  Vaping Use   Vaping Use: Never used  Substance and Sexual Activity   Alcohol use: Yes    Comment: 01/03/2018 "couple drinks/month"   Drug use: Yes    Types: Marijuana    Comment: VERY INTERMITTENT    Sexual activity: Not Currently

## 2019-11-04 ENCOUNTER — Ambulatory Visit (INDEPENDENT_AMBULATORY_CARE_PROVIDER_SITE_OTHER): Payer: Medicare Other | Admitting: *Deleted

## 2019-11-04 ENCOUNTER — Ambulatory Visit (INDEPENDENT_AMBULATORY_CARE_PROVIDER_SITE_OTHER): Payer: Medicare Other

## 2019-11-04 DIAGNOSIS — I5032 Chronic diastolic (congestive) heart failure: Secondary | ICD-10-CM

## 2019-11-04 DIAGNOSIS — I5042 Chronic combined systolic (congestive) and diastolic (congestive) heart failure: Secondary | ICD-10-CM | POA: Diagnosis not present

## 2019-11-04 DIAGNOSIS — I428 Other cardiomyopathies: Secondary | ICD-10-CM

## 2019-11-04 DIAGNOSIS — Z95810 Presence of automatic (implantable) cardiac defibrillator: Secondary | ICD-10-CM

## 2019-11-04 LAB — CUP PACEART REMOTE DEVICE CHECK
Battery Remaining Longevity: 27 mo
Battery Voltage: 2.94 V
Brady Statistic AP VP Percent: 14.61 %
Brady Statistic AP VS Percent: 0.01 %
Brady Statistic AS VP Percent: 85.33 %
Brady Statistic AS VS Percent: 0.05 %
Brady Statistic RA Percent Paced: 14.02 %
Brady Statistic RV Percent Paced: 99.82 %
Date Time Interrogation Session: 20210629022604
HighPow Impedance: 43 Ohm
HighPow Impedance: 70 Ohm
Implantable Lead Implant Date: 20080818
Implantable Lead Implant Date: 20080818
Implantable Lead Implant Date: 20080818
Implantable Lead Location: 753858
Implantable Lead Location: 753859
Implantable Lead Location: 753860
Implantable Lead Model: 4193
Implantable Lead Model: 5076
Implantable Lead Model: 6947
Implantable Pulse Generator Implant Date: 20170531
Lead Channel Impedance Value: 266 Ohm
Lead Channel Impedance Value: 342 Ohm
Lead Channel Impedance Value: 380 Ohm
Lead Channel Impedance Value: 4047 Ohm
Lead Channel Impedance Value: 4047 Ohm
Lead Channel Impedance Value: 437 Ohm
Lead Channel Pacing Threshold Amplitude: 1 V
Lead Channel Pacing Threshold Amplitude: 1 V
Lead Channel Pacing Threshold Amplitude: 1.875 V
Lead Channel Pacing Threshold Pulse Width: 0.4 ms
Lead Channel Pacing Threshold Pulse Width: 0.4 ms
Lead Channel Pacing Threshold Pulse Width: 0.4 ms
Lead Channel Sensing Intrinsic Amplitude: 13.625 mV
Lead Channel Sensing Intrinsic Amplitude: 13.625 mV
Lead Channel Sensing Intrinsic Amplitude: 4.375 mV
Lead Channel Sensing Intrinsic Amplitude: 4.375 mV
Lead Channel Setting Pacing Amplitude: 2 V
Lead Channel Setting Pacing Amplitude: 2.5 V
Lead Channel Setting Pacing Amplitude: 2.5 V
Lead Channel Setting Pacing Pulse Width: 0.4 ms
Lead Channel Setting Pacing Pulse Width: 0.4 ms
Lead Channel Setting Sensing Sensitivity: 0.3 mV

## 2019-11-05 NOTE — Progress Notes (Signed)
EPIC Encounter for ICM Monitoring  Patient Name: Morgan Clay is a 74 y.o. female Date: 11/05/2019 Primary Care Physican: Billie Ruddy, MD Primary Cardiologist:[place] Electrophysiologist: Caryl Comes Nephrologist: [name] Bi-V Pacing:99.8% 5/4/2021Weight:148 lbs  Clinical Status (13-Oct-2019 to 04-Nov-2019)  AT/AF 148  Time in AT/AF 6.1 hr/day (25.2%)  Longest AT/AF 3 hours  Transmission reviewed.   Optivol thoracic impedance suggesting possible fluid accumulation since 09/22/2019 with exception of 2-3 days at baseline.  Prescribed:  Torsemide20 mgTake 3 tablets (60 mg total) by mouth every morning AND 2 tablets (40 mg total) every evening.  Potassium 20 mEq take 2 tablets (40 mEq total) by mouth daily.  Metolazone 2.5 mg Take 1 tablet (2.5 mg total) by mouth as directed for 1 day. Then as directed by the CHF clinic  Eliquis 5 mg take 1 tablet twice a day  Labs: 07/23/2019 Creatinine1.89, BUN39, Potassium3.6, Sodium139, GFR31.68  07/17/2019 Creatinine1.82, BUN34, Potassium3.7, Sodium139, GFR33.09 01/02/2019 Creatinine 1.91, BUN 37, Potassium 4.2, Sodium 136, GFR 26-30 12/25/2018 Creatinine 1.98, BUN 38, Potassium 3.8, Sodium 140 A complete set of results can be found in Results Review.  Recommendations: None.  Follow-up plan: ICM clinic phone appointment on7/26/2021. 91 day device clinic remote transmission9/28/2021.   Copy of ICM check sent to [facility]   3 month ICM trend: 11/04/2019    1 Year ICM trend:       [name], RN 11/05/2019 4:36 PM

## 2019-11-06 NOTE — Progress Notes (Signed)
Remote ICD transmission.   

## 2019-11-07 ENCOUNTER — Telehealth: Payer: Self-pay | Admitting: Orthopaedic Surgery

## 2019-11-07 MED ORDER — PREDNISONE 10 MG PO TABS: ORAL_TABLET | ORAL | 0 refills | Status: DC

## 2019-11-07 NOTE — Telephone Encounter (Signed)
Patient called.   She wanted to know if she can be prescribed something for her pain, preferably prednisone.   Call back: [phone]

## 2019-11-07 NOTE — Telephone Encounter (Signed)
Rx sent 

## 2019-11-07 NOTE — Telephone Encounter (Signed)
Could you advise since Dr. Lorin Mercy is out of the office?

## 2019-11-07 NOTE — Addendum Note (Signed)
Addended by: Hortencia Pilar on: 11/07/2019 02:00 PM   Modules accepted: Orders

## 2019-11-07 NOTE — Telephone Encounter (Signed)
I called patient and advised.

## 2019-11-14 ENCOUNTER — Other Ambulatory Visit: Payer: Self-pay | Admitting: Student

## 2019-11-14 DIAGNOSIS — I428 Other cardiomyopathies: Secondary | ICD-10-CM

## 2019-11-26 ENCOUNTER — Telehealth: Payer: Self-pay

## 2019-11-26 MED ORDER — PREDNISONE 10 MG PO TABS
ORAL_TABLET | ORAL | 0 refills | Status: DC
Start: 2019-11-26 — End: 2019-12-29

## 2019-11-26 NOTE — Telephone Encounter (Signed)
Patient called stating that her right knee is swollen again.  Would like to know if she can get another Rx or should she come in for her appointment on Thursday, 11/27/2019?  CB# [phone].  Please advise.  Thank you.

## 2019-11-26 NOTE — Addendum Note (Signed)
Addended by: Meyer Cory on: 11/26/2019 04:54 PM   Modules accepted: Orders

## 2019-11-26 NOTE — Telephone Encounter (Signed)
Send in prednisone 10 mg # 20  one po daily times 20 days, make sure she has gone to PCP about uloric or allopurinol Tx as per last OV note.

## 2019-11-26 NOTE — Telephone Encounter (Signed)
Please advise. Patient received 12 day taper of prednisone from Dr. Junius Roads on 11/07/2019 when you were out of the office.

## 2019-11-26 NOTE — Telephone Encounter (Signed)
I called patient. Kidney doctor sent in allopurinol. Advised prednisone sent to pharmacy. One per day x 20 days.

## 2019-11-27 ENCOUNTER — Ambulatory Visit: Payer: Medicare Other | Admitting: Surgery

## 2019-12-01 ENCOUNTER — Telehealth: Payer: Self-pay

## 2019-12-01 ENCOUNTER — Ambulatory Visit (INDEPENDENT_AMBULATORY_CARE_PROVIDER_SITE_OTHER): Payer: Medicare Other

## 2019-12-01 DIAGNOSIS — I5032 Chronic diastolic (congestive) heart failure: Secondary | ICD-10-CM

## 2019-12-01 DIAGNOSIS — Z95810 Presence of automatic (implantable) cardiac defibrillator: Secondary | ICD-10-CM | POA: Diagnosis not present

## 2019-12-01 NOTE — Progress Notes (Signed)
EPIC Encounter for ICM Monitoring  Patient Name: Morgan Clay is a 74 y.o. female Date: 12/01/2019 Primary Care Physican: Billie Ruddy, MD Primary Cardiologist:[place] Electrophysiologist: Caryl Comes Nephrologist: [name] Bi-V Pacing:99.6% 10/29/2019 OfficeWeight:143 lbs  Time in AT/AF  <0.1 hr/day (<0.1%) VT-NS           1 episode   Attempted call to patient and unable to reach.  Left message to return call. Transmission reviewed.   Optivol thoracic impedancesuggesting possible fluid accumulation since 11/06/2019.  Prescribed:  Torsemide20 mgTake 3 tablets (60 mg total) by mouth every morning AND 2 tablets (40 mg total) every evening.  Potassium 20 mEq take 2 tablets (40 mEq total) by mouth daily.  Metolazone 2.5 mgTake 1 tablet (2.5 mg total) by mouth as directed for 1 day. Then as directed by the CHF clinic  Eliquis 5 mg take 1 tablet twice a day  Labs: 07/23/2019 Creatinine1.89, BUN39, Potassium3.6, Sodium139, GFR31.68  07/17/2019 Creatinine1.82, BUN34, Potassium3.7, Sodium139, GFR33.09 01/02/2019 Creatinine 1.91, BUN 37, Potassium 4.2, Sodium 136, GFR 26-30 12/25/2018 Creatinine 1.98, BUN 38, Potassium 3.8, Sodium 140 A complete set of results can be found in Results Review.  Recommendations: Left voice mail with ICM number and encouraged to call if experiencing any fluid symptoms.  Follow-up plan: ICM clinic phone appointment on8/01/2020 to recheck fluid levels (Dr [name] will check on 7/30). 91 day device clinic remote transmission9/28/2021.  EP/Cardiology Office Visits: 12/05/2019 with Dr. [name].  Last EP telehealth visit was 07/24/2019 (no recall)  Copy of ICM check sent to Dr. Caryl Comes and Dr [name] for review.   3 month ICM trend: 12/01/2019    1 Year ICM trend:       [name], RN 12/01/2019 12:30 PM

## 2019-12-01 NOTE — Telephone Encounter (Signed)
Remote ICM transmission received.  Attempted call to patient regarding ICM remote transmission and left detailed message per DPR to return call.  Advised to return call for any fluid symptoms or questions.      

## 2019-12-04 NOTE — Progress Notes (Signed)
Increase torsemide to 60 mg bid, BMET in 1 week.

## 2019-12-05 ENCOUNTER — Other Ambulatory Visit: Payer: Self-pay

## 2019-12-05 ENCOUNTER — Encounter (HOSPITAL_COMMUNITY): Payer: Self-pay | Admitting: Cardiology

## 2019-12-05 ENCOUNTER — Ambulatory Visit (HOSPITAL_COMMUNITY)
Admission: RE | Admit: 2019-12-05 | Discharge: 2019-12-05 | Disposition: A | Discharge status: Home / Self Care | Payer: Medicare Other | Source: Ambulatory Visit | Attending: Cardiology | Admitting: Cardiology

## 2019-12-05 VITALS — BP 160/70 | HR 69 | Wt 149.0 lb

## 2019-12-05 DIAGNOSIS — Z8719 Personal history of other diseases of the digestive system: Secondary | ICD-10-CM | POA: Insufficient documentation

## 2019-12-05 DIAGNOSIS — Z9049 Acquired absence of other specified parts of digestive tract: Secondary | ICD-10-CM | POA: Insufficient documentation

## 2019-12-05 DIAGNOSIS — F329 Major depressive disorder, single episode, unspecified: Secondary | ICD-10-CM | POA: Diagnosis not present

## 2019-12-05 DIAGNOSIS — N183 Chronic kidney disease, stage 3 unspecified: Secondary | ICD-10-CM | POA: Diagnosis not present

## 2019-12-05 DIAGNOSIS — E669 Obesity, unspecified: Secondary | ICD-10-CM | POA: Insufficient documentation

## 2019-12-05 DIAGNOSIS — I4892 Unspecified atrial flutter: Secondary | ICD-10-CM | POA: Diagnosis not present

## 2019-12-05 DIAGNOSIS — I5032 Chronic diastolic (congestive) heart failure: Secondary | ICD-10-CM | POA: Insufficient documentation

## 2019-12-05 DIAGNOSIS — Z95810 Presence of automatic (implantable) cardiac defibrillator: Secondary | ICD-10-CM | POA: Insufficient documentation

## 2019-12-05 DIAGNOSIS — I130 Hypertensive heart and chronic kidney disease with heart failure and stage 1 through stage 4 chronic kidney disease, or unspecified chronic kidney disease: Secondary | ICD-10-CM | POA: Insufficient documentation

## 2019-12-05 DIAGNOSIS — E1122 Type 2 diabetes mellitus with diabetic chronic kidney disease: Secondary | ICD-10-CM | POA: Diagnosis not present

## 2019-12-05 DIAGNOSIS — I482 Chronic atrial fibrillation, unspecified: Secondary | ICD-10-CM

## 2019-12-05 DIAGNOSIS — I428 Other cardiomyopathies: Secondary | ICD-10-CM | POA: Insufficient documentation

## 2019-12-05 DIAGNOSIS — Z8249 Family history of ischemic heart disease and other diseases of the circulatory system: Secondary | ICD-10-CM | POA: Insufficient documentation

## 2019-12-05 DIAGNOSIS — Z8711 Personal history of peptic ulcer disease: Secondary | ICD-10-CM | POA: Insufficient documentation

## 2019-12-05 DIAGNOSIS — M109 Gout, unspecified: Secondary | ICD-10-CM | POA: Insufficient documentation

## 2019-12-05 DIAGNOSIS — Z7901 Long term (current) use of anticoagulants: Secondary | ICD-10-CM | POA: Insufficient documentation

## 2019-12-05 DIAGNOSIS — Z7952 Long term (current) use of systemic steroids: Secondary | ICD-10-CM | POA: Diagnosis not present

## 2019-12-05 DIAGNOSIS — K254 Chronic or unspecified gastric ulcer with hemorrhage: Secondary | ICD-10-CM | POA: Diagnosis not present

## 2019-12-05 DIAGNOSIS — Z79899 Other long term (current) drug therapy: Secondary | ICD-10-CM | POA: Insufficient documentation

## 2019-12-05 DIAGNOSIS — Z8619 Personal history of other infectious and parasitic diseases: Secondary | ICD-10-CM | POA: Insufficient documentation

## 2019-12-05 DIAGNOSIS — G4733 Obstructive sleep apnea (adult) (pediatric): Secondary | ICD-10-CM | POA: Diagnosis not present

## 2019-12-05 DIAGNOSIS — K31819 Angiodysplasia of stomach and duodenum without bleeding: Secondary | ICD-10-CM | POA: Diagnosis not present

## 2019-12-05 LAB — CBC
HCT: 29.7 % — ABNORMAL LOW (ref 36.0–46.0)
Hemoglobin: 9.4 g/dL — ABNORMAL LOW (ref 12.0–15.0)
MCH: 27.1 pg (ref 26.0–34.0)
MCHC: 31.6 g/dL (ref 30.0–36.0)
MCV: 85.6 fL (ref 80.0–100.0)
Platelets: 283 10*3/uL (ref 150–400)
RBC: 3.47 MIL/uL — ABNORMAL LOW (ref 3.87–5.11)
RDW: 19 % — ABNORMAL HIGH (ref 11.5–15.5)
WBC: 5.2 10*3/uL (ref 4.0–10.5)
nRBC: 0 % (ref 0.0–0.2)

## 2019-12-05 LAB — BASIC METABOLIC PANEL
Anion gap: 10 (ref 5–15)
BUN: 35 mg/dL — ABNORMAL HIGH (ref 8–23)
CO2: 24 mmol/L (ref 22–32)
Calcium: 9.4 mg/dL (ref 8.9–10.3)
Chloride: 102 mmol/L (ref 98–111)
Creatinine, Ser: 1.94 mg/dL — ABNORMAL HIGH (ref 0.44–1.00)
GFR calc Af Amer: 29 mL/min — ABNORMAL LOW (ref >60)
GFR calc non Af Amer: 25 mL/min — ABNORMAL LOW (ref >60)
Glucose, Bld: 171 mg/dL — ABNORMAL HIGH (ref 70–99)
Potassium: 4 mmol/L (ref 3.5–5.1)
Sodium: 136 mmol/L (ref 135–145)

## 2019-12-05 LAB — PROTIME-INR
INR: 1.3 — ABNORMAL HIGH (ref 0.8–1.2)
Prothrombin Time: 15.6 seconds — ABNORMAL HIGH (ref 11.4–15.2)

## 2019-12-05 MED ORDER — SPIRONOLACTONE 25 MG PO TABS
12.5000 mg | ORAL_TABLET | Freq: Every day | ORAL | 3 refills | Status: DC
Start: 2019-12-05 — End: 2020-07-12

## 2019-12-05 MED ORDER — BIDIL 20-37.5 MG PO TABS: 1.0000 | ORAL_TABLET | Freq: Three times a day (TID) | ORAL | 2 refills | Status: DC

## 2019-12-05 MED ORDER — TORSEMIDE 20 MG PO TABS: 60.0000 mg | ORAL_TABLET | Freq: Two times a day (BID) | ORAL | 3 refills | Status: DC

## 2019-12-05 NOTE — Patient Instructions (Signed)
INCREASE Bidil to 1 tab, three times daily  START Spironolactone 12.5 mg, one half tab daily INCREASE Torsemide to 60 mg (3 tabs) twice daily  Labs today We will only contact you if something comes back abnormal or we need to make some changes. Otherwise no news is good news!  Your physician has requested that you have an echocardiogram. Echocardiography is a painless test that uses sound waves to create images of your heart. It provides your doctor with information about the size and shape of your heart and how well your heart's chambers and valves are working. This procedure takes approximately one hour. There are no restrictions for this procedure.  Your physician recommends that you schedule a follow-up appointment in: 2-4 weeks with Dr Kendall Flack are scheduled for a Cardiac Catheterization on Thursday, August 5 with Dr. Loralie Champagne.  1. Please arrive at the [facility] (Main Entrance A) at [facility]: [address] [place], [place] [address] at 5:30 AM (This time is two hours before your procedure to ensure your preparation). Free valet parking service is available.   Special note: Every effort is made to have your procedure done on time. Please understand that emergencies sometimes delay scheduled procedures.  2. Diet: Do not eat solid foods after midnight.  The patient may have clear liquids until 5am upon the day of the procedure.  3. Labs: pre procedure labs done 12/05/19  4. Medication instructions in preparation for your procedure:   Contrast Allergy: No  Stop taking Eliquis (Apixiban) on Wednesday, August 4. And Thursday August 5.  Stop taking Torsemide the evening Wednesday, August 4 and the morning Thursday August 5.   On the morning of your procedure, take your Aspirin and any morning medicines NOT listed above.  You may use sips of water.  5. Plan for one night stay--bring personal belongings. 6. Bring a current list of your medications and current  insurance cards. 7. You MUST have a responsible person to drive you home. 8. Someone MUST be with you the first 24 hours after you arrive home or your discharge will be delayed. 9. Please wear clothes that are easy to get on and off and wear slip-on shoes.  Thank you for allowing [name] to care for you!   -- [facility]

## 2019-12-05 NOTE — Progress Notes (Signed)
Attempted call to patient.  Patient seen in HF clinic today by Dr [name] and was instructed to increase Torsemide and scheduled for labs.

## 2019-12-07 NOTE — Progress Notes (Signed)
PCP: Dr. Colin Benton Cardiology: Dr. Caryl Comes HF Cardiology: Dr. [name]  74 y.o. with history of nonischemic cardiomyopathy with recovery of EF with BiV pacing, recurrent GI bleeding, and paroxysmal atrial flutter was referred by Dr. Caryl Comes for evaluation of CHF.  Patient has a history of presumed nonischemic cardiomyopathy for a number of years.  Echo in 12/15 showed EF 25-30% and Cardiolite at that time showed no ischemia.  She had Medtronic CRT-D system placed, and EF recovered. In 4/19, echo showed EF 60-65%.    She has additionally had problems with GI bleeding.  She had been on Eliquis for atrial flutter but this was stopped with recurrent GI bleeding.  She had bleeding from a gastric ulcer but was also noted to have multiple small bowel AVMs on capsule endoscopy out of reach of endoscope.  Eliquis was stopped. She has had no melena or BRBPR off Eliquis.   Echo in 10/19 showed EF 55-60%, mild-moderate MR, moderate AI, moderate RV dilation.   She was admitted in 1/20 with diverticulitis and ended up having a sigmoid colectomy.    Echo in 9/20 showed EF lower at 45-50%, moderate AI.   She returns for followup of CHF.   I have not seen her in a long time.  Her weight is stable compared to last appointment.  She has been having more exertional dyspnea recently, she is short of breath after walking 10-20 feet.  She is very limited.  She will get chest tightness occasionally with dyspnea but not consistently.  She has significant orthopnea.  No palpitations.  Having right knee pain attributed to gout, currently on prednisone taper with improvement.   Medtronic device interrogation: Fluid index > threshold, >99% BiV pacing, currently NSR but had prolonged AF in 6/21, 10% AF total.   Labs (9/19):  Hgb 10.2, K 3.8, creatinine 2.5 Labs (02/26/2018): K 3.4, creatinine 2.2, WBC 76.  Labs (2/20): K 3.9, creatinine 1.8 Labs (8/20): K 3.8, creatinine 1.98 Labs (3/21): LDL 33 Labs (5/21): K 3.6, creatinine  1.69  PMH: 1. Diabetes: diet-controlled.  2. Gout 3. HCV: Treated with Harvoni.  4. Atrial flutter: Atypical.  Had DCCV in 3/19, maintained on amiodarone.  5. HTN 6. CKD stage 3: Sees nephrology, [name].  7. H/o SBO 8. Obesity 9. Nonischemic cardiomyopathy: Echo in 12/15 with EF 25-30%.  Cardiolite in 12/15 with no ischemia.  She had a Medtronic CRT-D device placed with recovery of EF.   - Echo (4/19): EF 60-65% with moderate AI, moderate MR, and PASP 49 mmHg.  - RHC 02/13/2018 RA mean 6, RV 49/7,PA 50/18, mean 31,PCWP mean 20, Oxygen saturations:PA 67% AO 97%, Cardiac Output (Fick) 4.6, Cardiac Index (Fick) 2.9, PVR 2.4 WU - Echo (10/19): EF 55-60%, mild-moderate MR, moderate AI, moderate RV dilation, PASP 60 mmHg.  - PYP scan (3/20): H/CL 1.3, grade 1-2 visually.  Unlikely transthyretin cardiac amyloidosis.  - Echo (9/20): EF 45-50%, moderate aortic insufficiency.  10. GI bleeding: Gastric AVM and also gastric ulcer seen in past.  - 8/19 capsule endoscopy showed small bowel AVMs beyond the reach of endoscopy.  - With recurrent episodes of GI bleeding, Eliquis was stopped.  11. Fe deficiency anemia.  12. Depression 13. OSA: Uses CPAP.  14. Diverticulitis: Sigmoid colectomy in 1/20.   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      Home Situation: lives alone       Spiritual Beliefs: Christian - Baptist      Lifestyle: no regular exercise; poor diet      She is divorced at least one adult child   Never smoker    rare alcohol to occasional at best    no substance abuse         Social Determinants of Radio broadcast assistant Strain:    Difficulty of Paying Living Expenses:   Food Insecurity:    Worried About Charity fundraiser in the Last Year:    Arboriculturist in the Last Year:   Transportation Needs:    Film/video editor (Medical):    Lack of Transportation (Non-Medical):   Physical Activity:    Days of Exercise per Week:    Minutes of Exercise per Session:   Stress:    Feeling of Stress :   Social Connections:    Frequency of Communication with Friends and Family:    Frequency of Social Gatherings with Friends and Family:    Attends Religious Services:    Active Member of Clubs or Organizations:    Attends Music therapist:    Marital Status:   Intimate Partner Violence:    Fear of Current or Ex-Partner:    Emotionally Abused:    Physically Abused:    Sexually Abused:    Family History  Problem Relation Age of Onset   Asthma Father    Heart attack Father    Asthma Sister    Lung cancer Sister    Heart attack Mother    Stroke Brother    Colon cancer Neg Hx    Esophageal cancer Neg Hx    Pancreatic cancer Neg Hx    Stomach cancer Neg Hx    Liver disease Neg Hx    ROS: All systems reviewed and negative except as per HPI.   Current Outpatient Medications  Medication Sig Dispense Refill   albuterol (PROVENTIL HFA;VENTOLIN HFA) 108 (90 Base) MCG/ACT inhaler Inhale 2 puffs into the lungs every 6 (six) hours as needed for wheezing or shortness of breath. (Patient not taking: Reported on 12/05/2019) 1 Inhaler 3   allopurinol (ZYLOPRIM) 300 MG tablet Take 300 mg by mouth daily.      apixaban (ELIQUIS) 5 MG TABS tablet Take 1 tablet (5 mg total) by mouth 2 (two) times daily.  60 tablet 11   benzonatate (TESSALON) 100 MG capsule Take 1 capsule (100 mg total) by mouth 3 (three) times daily as needed for cough. (Patient not taking: Reported on 12/05/2019) 30 capsule 0   carvedilol (COREG) 25 MG tablet Take 1 tablet by mouth twice daily (Patient taking differently: Take 25 mg by mouth 2 (two) times daily with a meal. ) 180 tablet 3   colchicine 0.6 MG tablet Take 0.6 mg by mouth daily as needed (  gout flares).   0   gabapentin (NEURONTIN) 300 MG capsule Take 1 capsule (300 mg total) by mouth 2 (two) times daily. 180 capsule 2   isosorbide-hydrALAZINE (BIDIL) 20-37.5 MG tablet Take 1 tablet by mouth 3 (three) times daily. 270 tablet 2   levothyroxine (SYNTHROID) 137 MCG tablet TAKE 1 TABLET BY MOUTH ONCE DAILY BEFORE BREAKFAST (Patient taking differently: Take 137 mcg by mouth daily before breakfast. ) 90 tablet 0   metolazone (ZAROXOLYN) 2.5 MG tablet Take 1 tablet (2.5 mg total) by mouth as directed for 1 day. Then as directed by the CHF clinic (Patient not taking: Reported on 12/05/2019) 5 tablet 3   nitroGLYCERIN (NITROSTAT) 0.4 MG SL tablet Place 0.4 mg under the tongue every 5 (five) minutes as needed for chest pain.     pantoprazole (PROTONIX) 40 MG tablet Take 1 tablet (40 mg total) by mouth daily. (Patient not taking: Reported on 12/05/2019) 30 tablet 4   Potassium Chloride ER 20 MEQ TBCR Take 40 mEq by mouth daily. (Patient not taking: Reported on 12/05/2019) 180 tablet 1   predniSONE (DELTASONE) 10 MG tablet Take one tablet daily x 20 days. (Patient taking differently: Take 10 mg by mouth daily as needed (gout). ) 20 tablet 0   sertraline (ZOLOFT) 50 MG tablet Take 1 tablet (50 mg total) by mouth daily. 30 tablet 3   torsemide (DEMADEX) 20 MG tablet Take 3 tablets (60 mg total) by mouth 2 (two) times daily. 180 tablet 3   traZODone (DESYREL) 50 MG tablet TAKE 1 TABLET BY MOUTH AT BEDTIME (Patient taking differently: Take 50 mg by mouth at bedtime. ) 90 tablet  0   Vitamin D, Ergocalciferol, (DRISDOL) 1.25 MG (50000 UT) CAPS capsule Take 1 capsule (50,000 Units total) by mouth every 7 (seven) days. (Patient not taking: Reported on 12/05/2019) 12 capsule 0   spironolactone (ALDACTONE) 25 MG tablet Take 0.5 tablets (12.5 mg total) by mouth daily. 45 tablet 3   No current facility-administered medications for this encounter.   BP (!) 160/70    Pulse 69    Wt 67.6 kg (149 lb)    SpO2 98%    BMI 30.09 kg/m   Wt Readings from Last 3 Encounters:  12/05/19 67.6 kg (149 lb)  10/29/19 64.9 kg (143 lb)  10/16/19 66.7 kg (147 lb)   General: NAD Neck: JVP 8-9 cm with HJR, no thyromegaly or thyroid nodule.  Lungs: Clear to auscultation bilaterally with normal respiratory effort. CV: Nondisplaced PMI.  Heart regular S1/S2, no S3/S4, 1/6 SEM RUSB.  No peripheral edema.  No carotid bruit.  Normal pedal pulses.  Abdomen: Soft, nontender, no hepatosplenomegaly, no distention.  Skin: Intact without lesions or rashes.  Neurologic: Alert and oriented x 3.  Psych: Normal affect. Extremities: No clubbing or cyanosis.  HEENT: Normal.   Assessment/Plan: 1. Heart failure with mid-range EF: Patient has history of presumed nonischemic cardiomyopathy (Cardiolite with no ischemia, cath not done with CKD). However, echo in 4/19 showed EF improved to 60-65% with BiV pacing.  Echo in 10/19 showed stable EF 55-60% with mild-moderate MR, moderate AI.  PYP scan in 3/20 was not suggestive of transthyretin amyloidosis. However, echo in 9/20 showed EF back down to 45-50%.  Increased dyspnea recently, NYHA class III symptoms.  She is volume overloaded on exam.   - Increase torsemide to 60 mg bid.  BMET today and in 10 days.  - Increase Bidil to 1 tab tid (she takes bid).  -  Add spironolactone 12.5 daily.  - Repeat echo to reassess EF.  - We discussed sodium and fluid restriction.  - I am going to do LHC/RHC to determine fluid status and to assess for CAD, she never had cath in the  past and has decreased EF on last echo with significant exertional symptoms.  We discussed risks/benefits and she agrees to procedure.  No Eliquis the day before or the day of cath, hold torsemide the night before and the night of cath.   2. Atrial flutter/fibrillation: History of atypical flutter, 3/19 DCCV.  10% of the time she is in AF according to device interrogation.  She is in NSR today.   - Continue apixaban 5 mg bid.  CBC today.  3. CKD: Stage 3, followed by [name].  Last creatinine 1.69, will need to limit contrast on cath.  4. GI bleeding: has had gastric ulcer and gastric AVMs as well as small bowel AVMs.  She is now off Eliquis and has not had recent bleeding. Followed by GI.   - CBC today.  5. Hypertension: BP elevated but adding spironolactone and increasing Bidil.   Followup in 2-3 weeks.   Loralie Champagne 12/07/2019

## 2019-12-07 NOTE — H&P (View-Only) (Signed)
PCP: Dr. Colin Benton Cardiology: Dr. Caryl Comes HF Cardiology: Dr. [name]  74 y.o. with history of nonischemic cardiomyopathy with recovery of EF with BiV pacing, recurrent GI bleeding, and paroxysmal atrial flutter was referred by Dr. Caryl Comes for evaluation of CHF.  Patient has a history of presumed nonischemic cardiomyopathy for a number of years.  Echo in 12/15 showed EF 25-30% and Cardiolite at that time showed no ischemia.  She had Medtronic CRT-D system placed, and EF recovered. In 4/19, echo showed EF 60-65%.    She has additionally had problems with GI bleeding.  She had been on Eliquis for atrial flutter but this was stopped with recurrent GI bleeding.  She had bleeding from a gastric ulcer but was also noted to have multiple small bowel AVMs on capsule endoscopy out of reach of endoscope.  Eliquis was stopped. She has had no melena or BRBPR off Eliquis.   Echo in 10/19 showed EF 55-60%, mild-moderate MR, moderate AI, moderate RV dilation.   She was admitted in 1/20 with diverticulitis and ended up having a sigmoid colectomy.    Echo in 9/20 showed EF lower at 45-50%, moderate AI.   She returns for followup of CHF.   I have not seen her in a long time.  Her weight is stable compared to last appointment.  She has been having more exertional dyspnea recently, she is short of breath after walking 10-20 feet.  She is very limited.  She will get chest tightness occasionally with dyspnea but not consistently.  She has significant orthopnea.  No palpitations.  Having right knee pain attributed to gout, currently on prednisone taper with improvement.   Medtronic device interrogation: Fluid index > threshold, >99% BiV pacing, currently NSR but had prolonged AF in 6/21, 10% AF total.   Labs (9/19):  Hgb 10.2, K 3.8, creatinine 2.5 Labs (02/26/2018): K 3.4, creatinine 2.2, WBC 76.  Labs (2/20): K 3.9, creatinine 1.8 Labs (8/20): K 3.8, creatinine 1.98 Labs (3/21): LDL 33 Labs (5/21): K 3.6, creatinine  1.69  PMH: 1. Diabetes: diet-controlled.  2. Gout 3. HCV: Treated with Harvoni.  4. Atrial flutter: Atypical.  Had DCCV in 3/19, maintained on amiodarone.  5. HTN 6. CKD stage 3: Sees nephrology, [name].  7. H/o SBO 8. Obesity 9. Nonischemic cardiomyopathy: Echo in 12/15 with EF 25-30%.  Cardiolite in 12/15 with no ischemia.  She had a Medtronic CRT-D device placed with recovery of EF.   - Echo (4/19): EF 60-65% with moderate AI, moderate MR, and PASP 49 mmHg.  - RHC 02/13/2018 RA mean 6, RV 49/7,PA 50/18, mean 31,PCWP mean 20, Oxygen saturations:PA 67% AO 97%, Cardiac Output (Fick) 4.6, Cardiac Index (Fick) 2.9, PVR 2.4 WU - Echo (10/19): EF 55-60%, mild-moderate MR, moderate AI, moderate RV dilation, PASP 60 mmHg.  - PYP scan (3/20): H/CL 1.3, grade 1-2 visually.  Unlikely transthyretin cardiac amyloidosis.  - Echo (9/20): EF 45-50%, moderate aortic insufficiency.  10. GI bleeding: Gastric AVM and also gastric ulcer seen in past.  - 8/19 capsule endoscopy showed small bowel AVMs beyond the reach of endoscopy.  - With recurrent episodes of GI bleeding, Eliquis was stopped.  11. Fe deficiency anemia.  12. Depression 13. OSA: Uses CPAP.  14. Diverticulitis: Sigmoid colectomy in 1/20.   Social History   Socioeconomic History  . Marital status: Divorced    Spouse name: Not on file  . Number of children: 1  . Years of education: 47  . Highest education level:  Not on file  Occupational History  . Not on file  Tobacco Use  . Smoking status: Never Smoker  . Smokeless tobacco: Never Used  Vaping Use  . Vaping Use: Never used  Substance and Sexual Activity  . Alcohol use: Yes    Comment: 01/03/2018 "couple drinks/month"  . Drug use: Yes    Types: Marijuana    Comment: VERY INTERMITTENT   . Sexual activity: Not Currently  Other Topics Concern  . 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 Difficulty of Paying Living Expenses:   Food Insecurity:   . Worried About Charity fundraiser in the Last Year:   . Arboriculturist in the Last Year:   Transportation Needs:   . Film/video editor (Medical):   [name] Lack of Transportation (Non-Medical):   Physical Activity:   . Days of Exercise per Week:   . Minutes of Exercise per Session:   Stress:   . Feeling of Stress :   Social Connections:   . Frequency of Communication with Friends and Family:   . Frequency of Social Gatherings with Friends and Family:   . Attends Religious Services:   . Active Member of Clubs or Organizations:   . Attends Archivist Meetings:   [name] Marital Status:   Intimate Partner Violence:   . Fear of Current or Ex-Partner:   . Emotionally Abused:   [name] Physically Abused:   . Sexually Abused:    Family History  Problem Relation Age of Onset  . Asthma Father   . Heart attack Father   . Asthma Sister   . Lung cancer Sister   . Heart attack Mother   . Stroke Brother   . Colon cancer Neg Hx   . Esophageal cancer Neg Hx   . Pancreatic cancer Neg Hx   . Stomach cancer Neg Hx   . Liver disease Neg Hx    ROS: All systems reviewed and negative except as per HPI.   Current Outpatient Medications  Medication Sig Dispense Refill  . albuterol (PROVENTIL HFA;VENTOLIN HFA) 108 (90 Base) MCG/ACT inhaler Inhale 2 puffs into the lungs every 6 (six) hours as needed for wheezing or shortness of breath. (Patient not taking: Reported on 12/05/2019) 1 Inhaler 3  . allopurinol (ZYLOPRIM) 300 MG tablet Take 300 mg by mouth daily.     [name] apixaban (ELIQUIS) 5 MG TABS tablet Take 1 tablet (5 mg total) by mouth 2 (two) times daily.  60 tablet 11  . benzonatate (TESSALON) 100 MG capsule Take 1 capsule (100 mg total) by mouth 3 (three) times daily as needed for cough. (Patient not taking: Reported on 12/05/2019) 30 capsule 0  . carvedilol (COREG) 25 MG tablet Take 1 tablet by mouth twice daily (Patient taking differently: Take 25 mg by mouth 2 (two) times daily with a meal. ) 180 tablet 3  . colchicine 0.6 MG tablet Take 0.6 mg by mouth daily as needed (  gout flares).   0  . gabapentin (NEURONTIN) 300 MG capsule Take 1 capsule (300 mg total) by mouth 2 (two) times daily. 180 capsule 2  . isosorbide-hydrALAZINE (BIDIL) 20-37.5 MG tablet Take 1 tablet by mouth 3 (three) times daily. 270 tablet 2  . levothyroxine (SYNTHROID) 137 MCG tablet TAKE 1 TABLET BY MOUTH ONCE DAILY BEFORE BREAKFAST (Patient taking differently: Take 137 mcg by mouth daily before breakfast. ) 90 tablet 0  . metolazone (ZAROXOLYN) 2.5 MG tablet Take 1 tablet (2.5 mg total) by mouth as directed for 1 day. Then as directed by the CHF clinic (Patient not taking: Reported on 12/05/2019) 5 tablet 3  . nitroGLYCERIN (NITROSTAT) 0.4 MG SL tablet Place 0.4 mg under the tongue every 5 (five) minutes as needed for chest pain.    . pantoprazole (PROTONIX) 40 MG tablet Take 1 tablet (40 mg total) by mouth daily. (Patient not taking: Reported on 12/05/2019) 30 tablet 4  . Potassium Chloride ER 20 MEQ TBCR Take 40 mEq by mouth daily. (Patient not taking: Reported on 12/05/2019) 180 tablet 1  . predniSONE (DELTASONE) 10 MG tablet Take one tablet daily x 20 days. (Patient taking differently: Take 10 mg by mouth daily as needed (gout). ) 20 tablet 0  . sertraline (ZOLOFT) 50 MG tablet Take 1 tablet (50 mg total) by mouth daily. 30 tablet 3  . torsemide (DEMADEX) 20 MG tablet Take 3 tablets (60 mg total) by mouth 2 (two) times daily. 180 tablet 3  . traZODone (DESYREL) 50 MG tablet TAKE 1 TABLET BY MOUTH AT BEDTIME (Patient taking differently: Take 50 mg by mouth at bedtime. ) 90 tablet  0  . Vitamin D, Ergocalciferol, (DRISDOL) 1.25 MG (50000 UT) CAPS capsule Take 1 capsule (50,000 Units total) by mouth every 7 (seven) days. (Patient not taking: Reported on 12/05/2019) 12 capsule 0  . spironolactone (ALDACTONE) 25 MG tablet Take 0.5 tablets (12.5 mg total) by mouth daily. 45 tablet 3   No current facility-administered medications for this encounter.   BP (!) 160/70   Pulse 69   Wt 67.6 kg (149 lb)   SpO2 98%   BMI 30.09 kg/m   Wt Readings from Last 3 Encounters:  12/05/19 67.6 kg (149 lb)  10/29/19 64.9 kg (143 lb)  10/16/19 66.7 kg (147 lb)   General: NAD Neck: JVP 8-9 cm with HJR, no thyromegaly or thyroid nodule.  Lungs: Clear to auscultation bilaterally with normal respiratory effort. CV: Nondisplaced PMI.  Heart regular S1/S2, no S3/S4, 1/6 SEM RUSB.  No peripheral edema.  No carotid bruit.  Normal pedal pulses.  Abdomen: Soft, nontender, no hepatosplenomegaly, no distention.  Skin: Intact without lesions or rashes.  Neurologic: Alert and oriented x 3.  Psych: Normal affect. Extremities: No clubbing or cyanosis.  HEENT: Normal.   Assessment/Plan: 1. Heart failure with mid-range EF: Patient has history of presumed nonischemic cardiomyopathy (Cardiolite with no ischemia, cath not done with CKD). However, echo in 4/19 showed EF improved to 60-65% with BiV pacing.  Echo in 10/19 showed stable EF 55-60% with mild-moderate MR, moderate AI.  PYP scan in 3/20 was not suggestive of transthyretin amyloidosis. However, echo in 9/20 showed EF back down to 45-50%.  Increased dyspnea recently, NYHA class III symptoms.  She is volume overloaded on exam.   - Increase torsemide to 60 mg bid.  BMET today and in 10 days.  - Increase Bidil to 1 tab tid (she takes bid).  - Add spironolactone 12.5 daily.  -  Repeat echo to reassess EF.  - We discussed sodium and fluid restriction.  - I am going to do LHC/RHC to determine fluid status and to assess for CAD, she never had cath in the  past and has decreased EF on last echo with significant exertional symptoms.  We discussed risks/benefits and she agrees to procedure.  No Eliquis the day before or the day of cath, hold torsemide the night before and the night of cath.   2. Atrial flutter/fibrillation: History of atypical flutter, 3/19 DCCV.  10% of the time she is in AF according to device interrogation.  She is in NSR today.   - Continue apixaban 5 mg bid.  CBC today.  3. CKD: Stage 3, followed by [name].  Last creatinine 1.69, will need to limit contrast on cath.  4. GI bleeding: has had gastric ulcer and gastric AVMs as well as small bowel AVMs.  She is now off Eliquis and has not had recent bleeding. Followed by GI.   - CBC today.  5. Hypertension: BP elevated but adding spironolactone and increasing Bidil.   Followup in 2-3 weeks.   Loralie Champagne 12/07/2019

## 2019-12-11 ENCOUNTER — Ambulatory Visit (HOSPITAL_COMMUNITY)
Admission: RE | Admit: 2019-12-11 | Discharge: 2019-12-11 | Disposition: A | Discharge status: Home / Self Care | Payer: Medicare Other | Source: Ambulatory Visit | Attending: Cardiology | Admitting: Cardiology

## 2019-12-11 ENCOUNTER — Other Ambulatory Visit: Payer: Self-pay

## 2019-12-11 ENCOUNTER — Encounter (HOSPITAL_COMMUNITY)
Admission: RE | Disposition: A | Discharge status: Home / Self Care | Payer: Self-pay | Source: Ambulatory Visit | Attending: Cardiology

## 2019-12-11 ENCOUNTER — Encounter (HOSPITAL_COMMUNITY): Payer: Self-pay | Admitting: Cardiology

## 2019-12-11 DIAGNOSIS — Z6828 Body mass index (BMI) 28.0-28.9, adult: Secondary | ICD-10-CM | POA: Insufficient documentation

## 2019-12-11 DIAGNOSIS — Z20822 Contact with and (suspected) exposure to covid-19: Secondary | ICD-10-CM | POA: Insufficient documentation

## 2019-12-11 DIAGNOSIS — E1122 Type 2 diabetes mellitus with diabetic chronic kidney disease: Secondary | ICD-10-CM | POA: Diagnosis not present

## 2019-12-11 DIAGNOSIS — M109 Gout, unspecified: Secondary | ICD-10-CM | POA: Insufficient documentation

## 2019-12-11 DIAGNOSIS — Z7901 Long term (current) use of anticoagulants: Secondary | ICD-10-CM | POA: Diagnosis not present

## 2019-12-11 DIAGNOSIS — Z79890 Hormone replacement therapy (postmenopausal): Secondary | ICD-10-CM | POA: Diagnosis not present

## 2019-12-11 DIAGNOSIS — I4891 Unspecified atrial fibrillation: Secondary | ICD-10-CM | POA: Insufficient documentation

## 2019-12-11 DIAGNOSIS — D509 Iron deficiency anemia, unspecified: Secondary | ICD-10-CM | POA: Insufficient documentation

## 2019-12-11 DIAGNOSIS — I2510 Atherosclerotic heart disease of native coronary artery without angina pectoris: Secondary | ICD-10-CM | POA: Insufficient documentation

## 2019-12-11 DIAGNOSIS — E669 Obesity, unspecified: Secondary | ICD-10-CM | POA: Diagnosis not present

## 2019-12-11 DIAGNOSIS — I4892 Unspecified atrial flutter: Secondary | ICD-10-CM | POA: Diagnosis not present

## 2019-12-11 DIAGNOSIS — Z95810 Presence of automatic (implantable) cardiac defibrillator: Secondary | ICD-10-CM | POA: Insufficient documentation

## 2019-12-11 DIAGNOSIS — I272 Pulmonary hypertension, unspecified: Secondary | ICD-10-CM | POA: Insufficient documentation

## 2019-12-11 DIAGNOSIS — I428 Other cardiomyopathies: Secondary | ICD-10-CM | POA: Diagnosis not present

## 2019-12-11 DIAGNOSIS — I130 Hypertensive heart and chronic kidney disease with heart failure and stage 1 through stage 4 chronic kidney disease, or unspecified chronic kidney disease: Secondary | ICD-10-CM | POA: Insufficient documentation

## 2019-12-11 DIAGNOSIS — Z79899 Other long term (current) drug therapy: Secondary | ICD-10-CM | POA: Insufficient documentation

## 2019-12-11 DIAGNOSIS — I509 Heart failure, unspecified: Secondary | ICD-10-CM | POA: Insufficient documentation

## 2019-12-11 DIAGNOSIS — F329 Major depressive disorder, single episode, unspecified: Secondary | ICD-10-CM | POA: Diagnosis not present

## 2019-12-11 DIAGNOSIS — N183 Chronic kidney disease, stage 3 unspecified: Secondary | ICD-10-CM | POA: Insufficient documentation

## 2019-12-11 DIAGNOSIS — G4733 Obstructive sleep apnea (adult) (pediatric): Secondary | ICD-10-CM | POA: Insufficient documentation

## 2019-12-11 HISTORY — PX: RIGHT/LEFT HEART CATH AND CORONARY ANGIOGRAPHY: CATH118266

## 2019-12-11 LAB — POCT I-STAT EG7
Acid-Base Excess: 1 mmol/L (ref 0.0–2.0)
Acid-Base Excess: 1 mmol/L (ref 0.0–2.0)
Bicarbonate: 26.9 mmol/L (ref 20.0–28.0)
Bicarbonate: 27 mmol/L (ref 20.0–28.0)
Calcium, Ion: 1.14 mmol/L — ABNORMAL LOW (ref 1.15–1.40)
Calcium, Ion: 1.22 mmol/L (ref 1.15–1.40)
HCT: 30 % — ABNORMAL LOW (ref 36.0–46.0)
HCT: 32 % — ABNORMAL LOW (ref 36.0–46.0)
Hemoglobin: 10.2 g/dL — ABNORMAL LOW (ref 12.0–15.0)
Hemoglobin: 10.9 g/dL — ABNORMAL LOW (ref 12.0–15.0)
O2 Saturation: 60 %
O2 Saturation: 64 %
Potassium: 3.4 mmol/L — ABNORMAL LOW (ref 3.5–5.1)
Potassium: 3.6 mmol/L (ref 3.5–5.1)
Sodium: 136 mmol/L (ref 135–145)
Sodium: 138 mmol/L (ref 135–145)
TCO2: 28 mmol/L (ref 22–32)
TCO2: 28 mmol/L (ref 22–32)
pCO2, Ven: 45.8 mmHg (ref 44.0–60.0)
pCO2, Ven: 46.7 mmHg (ref 44.0–60.0)
pH, Ven: 7.37 (ref 7.250–7.430)
pH, Ven: 7.376 (ref 7.250–7.430)
pO2, Ven: 32 mmHg (ref 32.0–45.0)
pO2, Ven: 35 mmHg (ref 32.0–45.0)

## 2019-12-11 LAB — SARS CORONAVIRUS 2 BY RT PCR (HOSPITAL ORDER, PERFORMED IN ~~LOC~~ HOSPITAL LAB): SARS Coronavirus 2: NEGATIVE

## 2019-12-11 SURGERY — RIGHT/LEFT HEART CATH AND CORONARY ANGIOGRAPHY
Anesthesia: LOCAL

## 2019-12-11 MED ORDER — SODIUM CHLORIDE 0.9% FLUSH: 3.0000 mL | INTRAVENOUS | Status: DC | PRN

## 2019-12-11 MED ORDER — MIDAZOLAM HCL 2 MG/2ML IJ SOLN
INTRAMUSCULAR | Status: AC
  Filled 2019-12-11: qty 2

## 2019-12-11 MED ORDER — SODIUM CHLORIDE 0.9% FLUSH: 3.0000 mL | Freq: Two times a day (BID) | INTRAVENOUS | Status: DC

## 2019-12-11 MED ORDER — APIXABAN 5 MG PO TABS
5.0000 mg | ORAL_TABLET | Freq: Two times a day (BID) | ORAL | 11 refills | Status: DC
Start: 2019-12-12 — End: 2020-06-03

## 2019-12-11 MED ORDER — SODIUM CHLORIDE 0.9 % IV SOLN: INTRAVENOUS | Status: DC

## 2019-12-11 MED ORDER — LABETALOL HCL 5 MG/ML IV SOLN: 10.0000 mg | INTRAVENOUS | Status: DC | PRN

## 2019-12-11 MED ORDER — LIDOCAINE HCL (PF) 1 % IJ SOLN
INTRAMUSCULAR | Status: AC
  Filled 2019-12-11: qty 30

## 2019-12-11 MED ORDER — SODIUM CHLORIDE 0.9 % IV SOLN: 250.0000 mL | INTRAVENOUS | Status: DC | PRN

## 2019-12-11 MED ORDER — HEPARIN SODIUM (PORCINE) 1000 UNIT/ML IJ SOLN
INTRAMUSCULAR | Status: AC
  Filled 2019-12-11: qty 1

## 2019-12-11 MED ORDER — ASPIRIN 81 MG PO CHEW: 81.0000 mg | CHEWABLE_TABLET | ORAL | Status: DC

## 2019-12-11 MED ORDER — VERAPAMIL HCL 2.5 MG/ML IV SOLN
INTRAVENOUS | Status: DC | PRN
  Administered 2019-12-11: 10 mL via INTRA_ARTERIAL

## 2019-12-11 MED ORDER — HEPARIN (PORCINE) IN NACL 1000-0.9 UT/500ML-% IV SOLN
INTRAVENOUS | Status: AC
  Filled 2019-12-11: qty 1000

## 2019-12-11 MED ORDER — HEPARIN (PORCINE) IN NACL 1000-0.9 UT/500ML-% IV SOLN
INTRAVENOUS | Status: DC | PRN
  Administered 2019-12-11 (×2): 500 mL

## 2019-12-11 MED ORDER — HYDRALAZINE HCL 20 MG/ML IJ SOLN: 10.0000 mg | INTRAMUSCULAR | Status: DC | PRN

## 2019-12-11 MED ORDER — ONDANSETRON HCL 4 MG/2ML IJ SOLN: 4.0000 mg | Freq: Four times a day (QID) | INTRAMUSCULAR | Status: DC | PRN

## 2019-12-11 MED ORDER — FENTANYL CITRATE (PF) 100 MCG/2ML IJ SOLN
INTRAMUSCULAR | Status: DC | PRN
  Administered 2019-12-11 (×3): 25 ug via INTRAVENOUS

## 2019-12-11 MED ORDER — FENTANYL CITRATE (PF) 100 MCG/2ML IJ SOLN
INTRAMUSCULAR | Status: AC
  Filled 2019-12-11: qty 2

## 2019-12-11 MED ORDER — VERAPAMIL HCL 2.5 MG/ML IV SOLN
INTRAVENOUS | Status: AC
  Filled 2019-12-11: qty 2

## 2019-12-11 MED ORDER — IOHEXOL 350 MG/ML SOLN
INTRAVENOUS | Status: DC | PRN
  Administered 2019-12-11: 25 mL

## 2019-12-11 MED ORDER — HEPARIN SODIUM (PORCINE) 1000 UNIT/ML IJ SOLN
INTRAMUSCULAR | Status: DC | PRN
  Administered 2019-12-11: 3500 [IU] via INTRAVENOUS

## 2019-12-11 MED ORDER — ACETAMINOPHEN 325 MG PO TABS: 650.0000 mg | ORAL_TABLET | ORAL | Status: DC | PRN

## 2019-12-11 MED ORDER — LIDOCAINE HCL (PF) 1 % IJ SOLN
INTRAMUSCULAR | Status: DC | PRN
  Administered 2019-12-11 (×2): 2 mL via INTRADERMAL

## 2019-12-11 MED ORDER — MIDAZOLAM HCL 2 MG/2ML IJ SOLN
INTRAMUSCULAR | Status: DC | PRN
  Administered 2019-12-11 (×3): 1 mg via INTRAVENOUS

## 2019-12-11 SURGICAL SUPPLY — 11 items
CATH 5FR JL3.5 JR4 ANG PIG MP (CATHETERS) ×2 IMPLANT
CATH BALLN WEDGE 5F 110CM (CATHETERS) ×2 IMPLANT
DEVICE RAD COMP TR BAND LRG (VASCULAR PRODUCTS) ×2 IMPLANT
GLIDESHEATH SLEND SS 6F .021 (SHEATH) ×2 IMPLANT
GUIDEWIRE INQWIRE 1.5J.035X260 (WIRE) ×1 IMPLANT
INQWIRE 1.5J .035X260CM (WIRE) ×2
KIT HEART LEFT (KITS) ×2 IMPLANT
PACK CARDIAC CATHETERIZATION (CUSTOM PROCEDURE TRAY) ×2 IMPLANT
SHEATH GLIDE SLENDER 4/5FR (SHEATH) ×2 IMPLANT
SHEATH PROBE COVER 6X72 (BAG) ×2 IMPLANT
TRANSDUCER W/STOPCOCK (MISCELLANEOUS) ×2 IMPLANT

## 2019-12-11 NOTE — Interval H&P Note (Signed)
History and Physical Interval Note:  12/11/2019 7:56 AM  Morgan Clay  has presented today for surgery, with the diagnosis of heart failure.  The various methods of treatment have been discussed with the patient and family. After consideration of risks, benefits and other options for treatment, the patient has consented to  Procedure(s): RIGHT/LEFT HEART CATH AND CORONARY ANGIOGRAPHY (N/A) as a surgical intervention.  The patient's history has been reviewed, patient examined, no change in status, stable for surgery.  I have reviewed the patient's chart and labs.  Questions were answered to the patient's satisfaction.     Jostin Rue [employer]

## 2019-12-11 NOTE — Discharge Instructions (Signed)
Restart Eliquis tomorrow morning (Friday).    Radial Site Care  This sheet gives you information about how to care for yourself after your procedure. Your health care provider may also give you more specific instructions. If you have problems or questions, contact your health care provider. What can I expect after the procedure? After the procedure, it is common to have:  Bruising and tenderness at the catheter insertion area. Follow these instructions at home: Medicines  Take over-the-counter and prescription medicines only as told by your health care provider. Insertion site care  Follow instructions from your health care provider about how to take care of your insertion site. Make sure you: ? Wash your hands with soap and water before you change your bandage (dressing). If soap and water are not available, use hand sanitizer. ? Change your dressing as told by your health care provider. ? Leave stitches (sutures), skin glue, or adhesive strips in place. These skin closures may need to stay in place for 2 weeks or longer. If adhesive strip edges start to loosen and curl up, you may trim the loose edges. Do not remove adhesive strips completely unless your health care provider tells you to do that.  Check your insertion site every day for signs of infection. Check for: ? Redness, swelling, or pain. ? Fluid or blood. ? Pus or a bad smell. ? Warmth.  Do not take baths, swim, or use a hot tub until your health care provider approves.  You may shower 24-48 hours after the procedure, or as directed by your health care provider. ? Remove the dressing and gently wash the site with plain soap and water. ? Pat the area dry with a clean towel. ? Do not rub the site. That could cause bleeding.  Do not apply powder or lotion to the site. Activity   For 24 hours after the procedure, or as directed by your health care provider: ? Do not flex or bend the affected arm. ? Do not push or pull  heavy objects with the affected arm. ? Do not drive yourself home from the hospital or clinic. You may drive 24 hours after the procedure unless your health care provider tells you not to. ? Do not operate machinery or power tools.  Do not lift anything that is heavier than 10 lb (4.5 kg), or the limit that you are told, until your health care provider says that it is safe.  Ask your health care provider when it is okay to: ? Return to work or school. ? Resume usual physical activities or sports. ? Resume sexual activity. General instructions  If the catheter site starts to bleed, raise your arm and put firm pressure on the site. If the bleeding does not stop, get help right away. This is a medical emergency.  If you went home on the same day as your procedure, a responsible adult should be with you for the first 24 hours after you arrive home.  Keep all follow-up visits as told by your health care provider. This is important. Contact a health care provider if:  You have a fever.  You have redness, swelling, or yellow drainage around your insertion site. Get help right away if:  You have unusual pain at the radial site.  The catheter insertion area swells very fast.  The insertion area is bleeding, and the bleeding does not stop when you hold steady pressure on the area.  Your arm or hand becomes pale, cool, tingly, or  numb. These symptoms may represent a serious problem that is an emergency. Do not wait to see if the symptoms will go away. Get medical help right away. Call your local emergency services (911 in the [place] Do not drive yourself to the hospital. Summary  After the procedure, it is common to have bruising and tenderness at the site.  Follow instructions from your health care provider about how to take care of your radial site wound. Check the wound every day for signs of infection.  Do not lift anything that is heavier than 10 lb (4.5 kg), or the limit that you are  told, until your health care provider says that it is safe. This information is not intended to replace advice given to you by your health care provider. Make sure you discuss any questions you have with your health care provider. Document Revised: 05/30/2017 Document Reviewed: 05/30/2017 Elsevier Patient Education  2020 [employer].

## 2019-12-15 ENCOUNTER — Ambulatory Visit (INDEPENDENT_AMBULATORY_CARE_PROVIDER_SITE_OTHER): Payer: Medicare Other

## 2019-12-15 DIAGNOSIS — I5032 Chronic diastolic (congestive) heart failure: Secondary | ICD-10-CM

## 2019-12-15 DIAGNOSIS — Z95810 Presence of automatic (implantable) cardiac defibrillator: Secondary | ICD-10-CM | POA: Diagnosis not present

## 2019-12-16 DIAGNOSIS — N183 Chronic kidney disease, stage 3 unspecified: Secondary | ICD-10-CM | POA: Diagnosis not present

## 2019-12-16 DIAGNOSIS — I129 Hypertensive chronic kidney disease with stage 1 through stage 4 chronic kidney disease, or unspecified chronic kidney disease: Secondary | ICD-10-CM | POA: Diagnosis not present

## 2019-12-16 DIAGNOSIS — D631 Anemia in chronic kidney disease: Secondary | ICD-10-CM | POA: Diagnosis not present

## 2019-12-16 DIAGNOSIS — I5043 Acute on chronic combined systolic (congestive) and diastolic (congestive) heart failure: Secondary | ICD-10-CM | POA: Diagnosis not present

## 2019-12-16 DIAGNOSIS — R809 Proteinuria, unspecified: Secondary | ICD-10-CM | POA: Diagnosis not present

## 2019-12-16 DIAGNOSIS — M109 Gout, unspecified: Secondary | ICD-10-CM | POA: Diagnosis not present

## 2019-12-17 ENCOUNTER — Telehealth: Payer: Self-pay

## 2019-12-17 NOTE — Progress Notes (Signed)
EPIC Encounter for ICM Monitoring  Patient Name: Morgan Clay is a 74 y.o. female Date: 12/17/2019 Primary Care Physican: Billie Ruddy, MD Primary Cardiologist:Mc[facility] Electrophysiologist: Caryl Comes Nephrologist: [name] Bi-V Pacing:99.6% 10/29/2019 OfficeWeight:143 lbs  Time in AT/AF  0.0 hr/day (0.0%)   Attempted call to patient and unable to reach.  Left message to return call. Transmission reviewed.   Optivol thoracic impedance suggests fluid levelsreturned to normal since 12/01/2019 remote transmission.  Prescribed:  Torsemide20 mgTake 3 tablets (60 mg total) by mouth twice a day.  Potassium 20 mEq take 2 tablets (40 mEq total) by mouth daily.  Spironolactone 25 mg take 0.5 tablet (12.5 mg total) daily  Eliquis 5 mg take 1 tablet twice a day  Labs: 12/11/2019 Potassium 3.6, Sodium 136 12/05/2019 Creatinine 1.94, BUN 35, Potassium 4.0, Sodium 136, GFR 25-29  10/02/2019 Creatinine 1.69, BUN 34, Potassium 3.6, Sodium 135  07/23/2019 Creatinine1.89, BUN39, Potassium3.6, Sodium139, PPJ09.32  A complete set of results can be found in Results Review.  Recommendations: Left voice mail with ICM number and encouraged to call if experiencing any fluid symptoms.  Follow-up plan: ICM clinic phone appointment on9/13/2021. 91 day device clinic remote transmission9/28/2021.  EP/Cardiology Office Visits: 01/19/2020 with Dr. [name].  Last EP telehealth visit was 07/24/2019 (no recall)  Copy of ICM check sent to Dr. Caryl Comes..  3 month ICM trend: 12/15/2019    1 Year ICM trend:       [name], RN 12/17/2019 2:18 PM

## 2019-12-17 NOTE — Telephone Encounter (Signed)
Remote ICM transmission received.  Attempted call to patient regarding ICM remote transmission and left detailed message per DPR.  Advised to return call for any fluid symptoms or questions.  

## 2019-12-29 ENCOUNTER — Other Ambulatory Visit: Payer: Self-pay

## 2019-12-29 ENCOUNTER — Ambulatory Visit (HOSPITAL_BASED_OUTPATIENT_CLINIC_OR_DEPARTMENT_OTHER)
Admission: RE | Admit: 2019-12-29 | Discharge: 2019-12-29 | Disposition: A | Discharge status: Home / Self Care | Payer: Medicare Other | Source: Ambulatory Visit | Attending: Cardiology | Admitting: Cardiology

## 2019-12-29 ENCOUNTER — Telehealth (HOSPITAL_COMMUNITY): Payer: Self-pay | Admitting: *Deleted

## 2019-12-29 ENCOUNTER — Ambulatory Visit (HOSPITAL_COMMUNITY)
Admission: RE | Admit: 2019-12-29 | Discharge: 2019-12-29 | Disposition: A | Discharge status: Home / Self Care | Payer: Medicare Other | Source: Ambulatory Visit | Attending: Cardiology | Admitting: Cardiology

## 2019-12-29 VITALS — BP 115/70 | HR 70 | Wt 148.0 lb

## 2019-12-29 DIAGNOSIS — E669 Obesity, unspecified: Secondary | ICD-10-CM | POA: Diagnosis not present

## 2019-12-29 DIAGNOSIS — I130 Hypertensive heart and chronic kidney disease with heart failure and stage 1 through stage 4 chronic kidney disease, or unspecified chronic kidney disease: Secondary | ICD-10-CM | POA: Diagnosis not present

## 2019-12-29 DIAGNOSIS — I429 Cardiomyopathy, unspecified: Secondary | ICD-10-CM | POA: Insufficient documentation

## 2019-12-29 DIAGNOSIS — I428 Other cardiomyopathies: Secondary | ICD-10-CM | POA: Diagnosis not present

## 2019-12-29 DIAGNOSIS — G4733 Obstructive sleep apnea (adult) (pediatric): Secondary | ICD-10-CM | POA: Insufficient documentation

## 2019-12-29 DIAGNOSIS — I5032 Chronic diastolic (congestive) heart failure: Secondary | ICD-10-CM | POA: Diagnosis not present

## 2019-12-29 DIAGNOSIS — Z95810 Presence of automatic (implantable) cardiac defibrillator: Secondary | ICD-10-CM | POA: Insufficient documentation

## 2019-12-29 DIAGNOSIS — Z79899 Other long term (current) drug therapy: Secondary | ICD-10-CM | POA: Diagnosis not present

## 2019-12-29 DIAGNOSIS — Z9049 Acquired absence of other specified parts of digestive tract: Secondary | ICD-10-CM | POA: Insufficient documentation

## 2019-12-29 DIAGNOSIS — N183 Chronic kidney disease, stage 3 unspecified: Secondary | ICD-10-CM | POA: Diagnosis not present

## 2019-12-29 DIAGNOSIS — Z8249 Family history of ischemic heart disease and other diseases of the circulatory system: Secondary | ICD-10-CM | POA: Diagnosis not present

## 2019-12-29 DIAGNOSIS — F329 Major depressive disorder, single episode, unspecified: Secondary | ICD-10-CM | POA: Insufficient documentation

## 2019-12-29 DIAGNOSIS — I2510 Atherosclerotic heart disease of native coronary artery without angina pectoris: Secondary | ICD-10-CM | POA: Diagnosis not present

## 2019-12-29 DIAGNOSIS — Z7901 Long term (current) use of anticoagulants: Secondary | ICD-10-CM | POA: Diagnosis not present

## 2019-12-29 DIAGNOSIS — M109 Gout, unspecified: Secondary | ICD-10-CM | POA: Insufficient documentation

## 2019-12-29 DIAGNOSIS — I4892 Unspecified atrial flutter: Secondary | ICD-10-CM | POA: Insufficient documentation

## 2019-12-29 DIAGNOSIS — Z7952 Long term (current) use of systemic steroids: Secondary | ICD-10-CM | POA: Diagnosis not present

## 2019-12-29 DIAGNOSIS — I351 Nonrheumatic aortic (valve) insufficiency: Secondary | ICD-10-CM | POA: Diagnosis not present

## 2019-12-29 DIAGNOSIS — K31819 Angiodysplasia of stomach and duodenum without bleeding: Secondary | ICD-10-CM | POA: Insufficient documentation

## 2019-12-29 DIAGNOSIS — E1122 Type 2 diabetes mellitus with diabetic chronic kidney disease: Secondary | ICD-10-CM | POA: Diagnosis not present

## 2019-12-29 DIAGNOSIS — Z8719 Personal history of other diseases of the digestive system: Secondary | ICD-10-CM | POA: Diagnosis not present

## 2019-12-29 DIAGNOSIS — Z8619 Personal history of other infectious and parasitic diseases: Secondary | ICD-10-CM | POA: Insufficient documentation

## 2019-12-29 LAB — ECHOCARDIOGRAM COMPLETE
Area-P 1/2: 3.83 cm2
Calc EF: 48.2 %
P 1/2 time: 485 msec
S' Lateral: 3.4 cm
Single Plane A2C EF: 51.6 %
Single Plane A4C EF: 49.6 %

## 2019-12-29 LAB — BASIC METABOLIC PANEL
Anion gap: 13 (ref 5–15)
BUN: 80 mg/dL — ABNORMAL HIGH (ref 8–23)
CO2: 23 mmol/L (ref 22–32)
Calcium: 10 mg/dL (ref 8.9–10.3)
Chloride: 96 mmol/L — ABNORMAL LOW (ref 98–111)
Creatinine, Ser: 2.88 mg/dL — ABNORMAL HIGH (ref 0.44–1.00)
GFR calc Af Amer: 18 mL/min — ABNORMAL LOW (ref >60)
GFR calc non Af Amer: 16 mL/min — ABNORMAL LOW (ref >60)
Glucose, Bld: 110 mg/dL — ABNORMAL HIGH (ref 70–99)
Potassium: 3.9 mmol/L (ref 3.5–5.1)
Sodium: 132 mmol/L — ABNORMAL LOW (ref 135–145)

## 2019-12-29 MED ORDER — TORSEMIDE 20 MG PO TABS: ORAL_TABLET | ORAL | 3 refills | Status: DC

## 2019-12-29 MED ORDER — TORSEMIDE 20 MG PO TABS: 40.0000 mg | ORAL_TABLET | Freq: Every day | ORAL | 3 refills | Status: DC

## 2019-12-29 NOTE — Progress Notes (Signed)
  Echocardiogram 2D Echocardiogram has been performed.  Morgan Clay 12/29/2019, 10:27 AM

## 2019-12-29 NOTE — Telephone Encounter (Signed)
-----   Message from [name], MD sent at 12/29/2019  4:47 PM EDT ----- She needs to hold torsemide for 2 days then decrease to 40 mg once daily.  BMET in 1 week please.

## 2019-12-29 NOTE — Patient Instructions (Signed)
Labs done today, we will call you for abnormal results  You have been ordered a PYP Scan.  This is done in the Radiology Department of [facility].  When you come for this test please plan to be there 2-3 hours.  Your physician recommends that you schedule a follow-up appointment in: 2 months  If you have any questions or concerns before your next appointment please send [name] a message through [place] or call our office at [phone].    TO LEAVE A MESSAGE FOR THE NURSE SELECT OPTION 2, PLEASE LEAVE A MESSAGE INCLUDING: . YOUR NAME . DATE OF BIRTH . CALL BACK NUMBER . REASON FOR CALL**this is important as we prioritize the call backs  [place] AS LONG AS YOU CALL BEFORE 4:00 PM  At the [facility], you and your health needs are our priority. As part of our continuing mission to provide you with exceptional heart care, we have created designated Provider Care Teams. These Care Teams include your primary Cardiologist (physician) and Advanced Practice Providers (APPs- Physician Assistants and Nurse Practitioners) who all work together to provide you with the care you need, when you need it.   You may see any of the following providers on your designated Care Team at your next follow up: [name] Dr Glori Bickers . Dr Loralie Champagne . Darrick Grinder, NP . Lyda Jester, PA . Audry Riles, PharmD   Please be sure to bring in all your medications bottles to every appointment.

## 2019-12-29 NOTE — Telephone Encounter (Signed)
Spoke w/pt, she is aware, agreeable, and verbalized understanding, med list updated, repeat lab sch for 8/31

## 2019-12-29 NOTE — Progress Notes (Signed)
PCP: Dr. Colin Benton Cardiology: Dr. Caryl Comes HF Cardiology: Dr. [name]  74 y.o. with history of nonischemic cardiomyopathy with recovery of EF with BiV pacing, recurrent GI bleeding, and paroxysmal atrial flutter was referred by Dr. Caryl Comes for evaluation of CHF.  Patient has a history of presumed nonischemic cardiomyopathy for a number of years.  Echo in 12/15 showed EF 25-30% and Cardiolite at that time showed no ischemia.  She had Medtronic CRT-D system placed, and EF recovered. In 4/19, echo showed EF 60-65%.    She has additionally had problems with GI bleeding.  She had been on Eliquis for atrial flutter but this was stopped with recurrent GI bleeding.  She had bleeding from a gastric ulcer but was also noted to have multiple small bowel AVMs on capsule endoscopy out of reach of endoscope.  Eliquis was stopped. She has had no melena or BRBPR off Eliquis.   Echo in 10/19 showed EF 55-60%, mild-moderate MR, moderate AI, moderate RV dilation.   She was admitted in 1/20 with diverticulitis and ended up having a sigmoid colectomy.    Echo in 9/20 showed EF lower at 45-50%, moderate AI.   She had LHC/RHC in 8/21 with nonobstructive CAD, normal filling pressures, and preserved cardiac output. Echo was done today and reviewed, EF 50-55%, mild LVH, mildly decreased RV systolic function, moderate aortic insufficiency.   She returns for followup of CHF.   Breathing is much better than prior with higher torsemide dosing.  She still gets short of breath with moderate activity such as making the bed and vacuuming.  No dyspnea walking on flat ground.  Rare lightheadedness.  No chest pain.  Tires easily.    Medtronic device interrogation: Fluid index < threshold, >99% BiV pacing, no AF recently.   ECG (personally reviewed): NSR, BiV pacing  Labs (9/19):  Hgb 10.2, K 3.8, creatinine 2.5 Labs (02/26/2018): K 3.4, creatinine 2.2, WBC 76.  Labs (2/20): K 3.9, creatinine 1.8 Labs (8/20): K 3.8, creatinine  1.98 Labs (3/21): LDL 33 Labs (5/21): K 3.6, creatinine 1.69 Labs (7/21): K 4, creatinine 1.94  PMH: 1. Diabetes: diet-controlled.  2. Gout 3. HCV: Treated with Harvoni.  4. Atrial flutter: Atypical.  Had DCCV in 3/19, maintained on amiodarone.  5. HTN 6. CKD stage 3: Sees nephrology, [name].  7. H/o SBO 8. Obesity 9. Nonischemic cardiomyopathy: Echo in 12/15 with EF 25-30%.  Cardiolite in 12/15 with no ischemia.  She had a Medtronic CRT-D device placed with recovery of EF.   - Echo (4/19): EF 60-65% with moderate AI, moderate MR, and PASP 49 mmHg.  - RHC 02/13/2018 RA mean 6, RV 49/7,PA 50/18, mean 31,PCWP mean 20, Oxygen saturations:PA 67% AO 97%, Cardiac Output (Fick) 4.6, Cardiac Index (Fick) 2.9, PVR 2.4 WU - Echo (10/19): EF 55-60%, mild-moderate MR, moderate AI, moderate RV dilation, PASP 60 mmHg.  - PYP scan (3/20): H/CL 1.3, grade 1-2 visually.  Unlikely transthyretin cardiac amyloidosis.  - Echo (9/20): EF 45-50%, moderate aortic insufficiency.  - LHC/RHC (8/21): 40% pLAD, 50% D1; mean RA 3, PA 41/16, mean PCWP 14, CI 2.74, PVR 2.99.  - Echo (8/21): EF 50-55%, mild LVH, grade 2 diastolic dysfunction, normal RV size with mildly decreased systolic function, moderate AI, no AS.  10. GI bleeding: Gastric AVM and also gastric ulcer seen in past.  - 8/19 capsule endoscopy showed small bowel AVMs beyond the reach of endoscopy.  - With recurrent episodes of GI bleeding, Eliquis was stopped.  11. Fe  deficiency anemia.  12. Depression 13. OSA: Uses CPAP.  14. Diverticulitis: Sigmoid colectomy in 1/20.   Social History   Socioeconomic History   Marital status: Divorced    Spouse name: Not on file   Number of children: 1   Years of education: 12   Highest education level: Not on file  Occupational History   Not on file  Tobacco Use   Smoking status: Never Smoker   Smokeless tobacco: Never Used  Vaping Use   Vaping Use: Never used  Substance and Sexual Activity    Alcohol use: Yes    Comment: 01/03/2018 "couple drinks/month"   Drug use: Yes    Types: Marijuana    Comment: VERY INTERMITTENT    Sexual activity: Not Currently  Other Topics Concern   Not on file  Social History Narrative   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      Home Situation: lives alone      Spiritual Beliefs: Christian - Baptist      Lifestyle: no regular exercise; poor diet      She is divorced at least one adult child   Never smoker    rare alcohol to occasional at best    no substance abuse         Social Determinants of Radio broadcast assistant Strain:    Difficulty of Paying Living Expenses: Not on file  Food Insecurity:    Worried About Charity fundraiser in the Last Year: Not on file   [employer] of Food in the Last Year: Not on file  Transportation Needs:    Lack of Transportation (Medical): Not on file   Lack of Transportation (Non-Medical): Not on file  Physical Activity:    Days of Exercise per Week: Not on file   Minutes of Exercise per Session: Not on file  Stress:    Feeling of Stress : Not on file  Social Connections:    Frequency of Communication with Friends and Family: Not on file   Frequency of Social Gatherings with Friends and Family: Not on file   Attends Religious Services: Not on file   Active Member of Clubs or Organizations: Not on file   Attends Archivist Meetings: Not on file   Marital Status: Not on file  Intimate Partner Violence:    Fear of Current or Ex-Partner: Not on file   Emotionally Abused: Not on file   Physically Abused: Not on file   Sexually Abused: Not on file   Family History  Problem Relation Age of Onset   Asthma Father    Heart attack Father    Asthma Sister    Lung cancer Sister    Heart attack Mother    Stroke Brother    Colon cancer Neg Hx    Esophageal cancer Neg Hx    Pancreatic cancer Neg Hx    Stomach cancer Neg Hx    Liver disease Neg Hx     ROS: All systems reviewed and negative except as per HPI.   Current Outpatient Medications  Medication Sig Dispense Refill   allopurinol (ZYLOPRIM) 300 MG tablet Take 300 mg by mouth daily.      apixaban (ELIQUIS) 5 MG TABS tablet Take 1 tablet (5 mg total) by mouth 2 (two) times daily. 60 tablet 11   carvedilol (COREG) 25 MG tablet Take 25 mg by mouth 2 (two) times daily with a meal.     colchicine 0.6 MG tablet  Take 0.6 mg by mouth daily as needed (gout flares).   0   gabapentin (NEURONTIN) 300 MG capsule Take 1 capsule (300 mg total) by mouth 2 (two) times daily. 180 capsule 2   isosorbide-hydrALAZINE (BIDIL) 20-37.5 MG tablet Take 1 tablet by mouth 3 (three) times daily. 270 tablet 2   levothyroxine (SYNTHROID) 137 MCG tablet Take 137 mcg by mouth daily before breakfast.     nitroGLYCERIN (NITROSTAT) 0.4 MG SL tablet Place 0.4 mg under the tongue every 5 (five) minutes as needed for chest pain.     predniSONE (DELTASONE) 10 MG tablet Take 10 mg by mouth as needed.     sertraline (ZOLOFT) 50 MG tablet Take 1 tablet (50 mg total) by mouth daily. 30 tablet 3   spironolactone (ALDACTONE) 25 MG tablet Take 0.5 tablets (12.5 mg total) by mouth daily. 45 tablet 3   traZODone (DESYREL) 50 MG tablet Take 50 mg by mouth at bedtime.     [START ON 01/01/2020] torsemide (DEMADEX) 20 MG tablet Take 2 tablets (40 mg total) by mouth daily. 180 tablet 3   No current facility-administered medications for this encounter.   BP 115/70    Pulse 70    Wt 67.1 kg (148 lb)    SpO2 96%    BMI 29.89 kg/m   Wt Readings from Last 3 Encounters:  12/29/19 67.1 kg (148 lb)  12/11/19 64 kg (141 lb)  12/05/19 67.6 kg (149 lb)   General: NAD Neck: No JVD, no thyromegaly or thyroid nodule.  Lungs: Clear to auscultation bilaterally with normal respiratory effort. CV: Nondisplaced PMI.  Heart regular S1/S2, no S3/S4, no murmur.  No peripheral edema.  No carotid bruit.  Normal pedal pulses.  Abdomen:  Soft, nontender, no hepatosplenomegaly, no distention.  Skin: Intact without lesions or rashes.  Neurologic: Alert and oriented x 3.  Psych: Normal affect. Extremities: No clubbing or cyanosis.  HEENT: Normal.   Assessment/Plan: 1. Heart failure with mid-range EF: Patient has history of presumed nonischemic cardiomyopathy (Cardiolite with no ischemia, cath not done with CKD). However, echo in 4/19 showed EF improved to 60-65% with BiV pacing.  Echo in 10/19 showed stable EF 55-60% with mild-moderate MR, moderate AI.  PYP scan in 3/20 was equivocal for transthyretin amyloidosis. However, echo in 9/20 showed EF back down to 45-50%.  LHC/RHC in 8/21 showed nonobstructive CAD, normal filling pressures and cardiac output.  Echo was done today and reviewed, EF 50-55%, mildly decreased RV systolic function, moderate AI. NYHA class II-III symptoms, she is not volume overloaded by exam or Optivol.    - Continue torsemide 60 qam/40 qpm.  Will check BMET today, may need to decrease dose if creatinine up.  - Continue Bidil 1 tab tid.  - Continue spironolactone 12.5 daily.  - I will repeat PYP scan as study 1.5 years ago was equivocal.   2. Atrial flutter/fibrillation: History of atypical flutter, 3/19 DCCV.  She has not been in atrial fibrillation recently by device interrogation.  She is in NSR today.   - Continue apixaban 5 mg bid.  3. CKD: Stage 3, followed by nephrology.  Last creatinine 1.94, check  BMET today.  May need to cut back torsemide based on low filling pressures on 8/21 RHC.   4. GI bleeding: has had gastric ulcer and gastric AVMs as well as small bowel AVMs.  She is now on Eliquis and has not had recent bleeding. Followed by GI.   5. Hypertension: BP controlled.  Followup in 2 months with NP/PA.   Loralie Champagne 12/29/2019

## 2020-01-02 ENCOUNTER — Other Ambulatory Visit: Payer: Self-pay | Admitting: Family Medicine

## 2020-01-02 DIAGNOSIS — Z1231 Encounter for screening mammogram for malignant neoplasm of breast: Secondary | ICD-10-CM

## 2020-01-04 ENCOUNTER — Other Ambulatory Visit: Payer: Self-pay | Admitting: Orthopaedic Surgery

## 2020-01-05 NOTE — Telephone Encounter (Signed)
I called her and discussed. Needs to see LMD since she is not on gout prevention meds and has Hx of GI bleed 2 yrs ago and is on Eliquis. I cannot refill again , she understands and will see PCP to discuss.

## 2020-01-05 NOTE — Telephone Encounter (Signed)
Please advise 

## 2020-01-06 ENCOUNTER — Ambulatory Visit (HOSPITAL_COMMUNITY)
Admission: RE | Admit: 2020-01-06 | Discharge: 2020-01-06 | Disposition: A | Discharge status: Home / Self Care | Payer: Medicare Other | Source: Ambulatory Visit | Attending: Internal Medicine | Admitting: Internal Medicine

## 2020-01-06 ENCOUNTER — Other Ambulatory Visit: Payer: Self-pay

## 2020-01-06 DIAGNOSIS — I428 Other cardiomyopathies: Secondary | ICD-10-CM | POA: Diagnosis not present

## 2020-01-06 LAB — BASIC METABOLIC PANEL
Anion gap: 12 (ref 5–15)
BUN: 69 mg/dL — ABNORMAL HIGH (ref 8–23)
CO2: 20 mmol/L — ABNORMAL LOW (ref 22–32)
Calcium: 9.4 mg/dL (ref 8.9–10.3)
Chloride: 98 mmol/L (ref 98–111)
Creatinine, Ser: 2.57 mg/dL — ABNORMAL HIGH (ref 0.44–1.00)
GFR calc Af Amer: 21 mL/min — ABNORMAL LOW (ref >60)
GFR calc non Af Amer: 18 mL/min — ABNORMAL LOW (ref >60)
Glucose, Bld: 118 mg/dL — ABNORMAL HIGH (ref 70–99)
Potassium: 4.9 mmol/L (ref 3.5–5.1)
Sodium: 130 mmol/L — ABNORMAL LOW (ref 135–145)

## 2020-01-07 IMAGING — DX DG CHEST 2V
2 series · 2 of 2 positions shown · non-contrast
Comparison: Radiographs September 30, 2017.

CLINICAL DATA: Pneumonia.

EXAM:
CHEST - 2 VIEW

[chest pa]
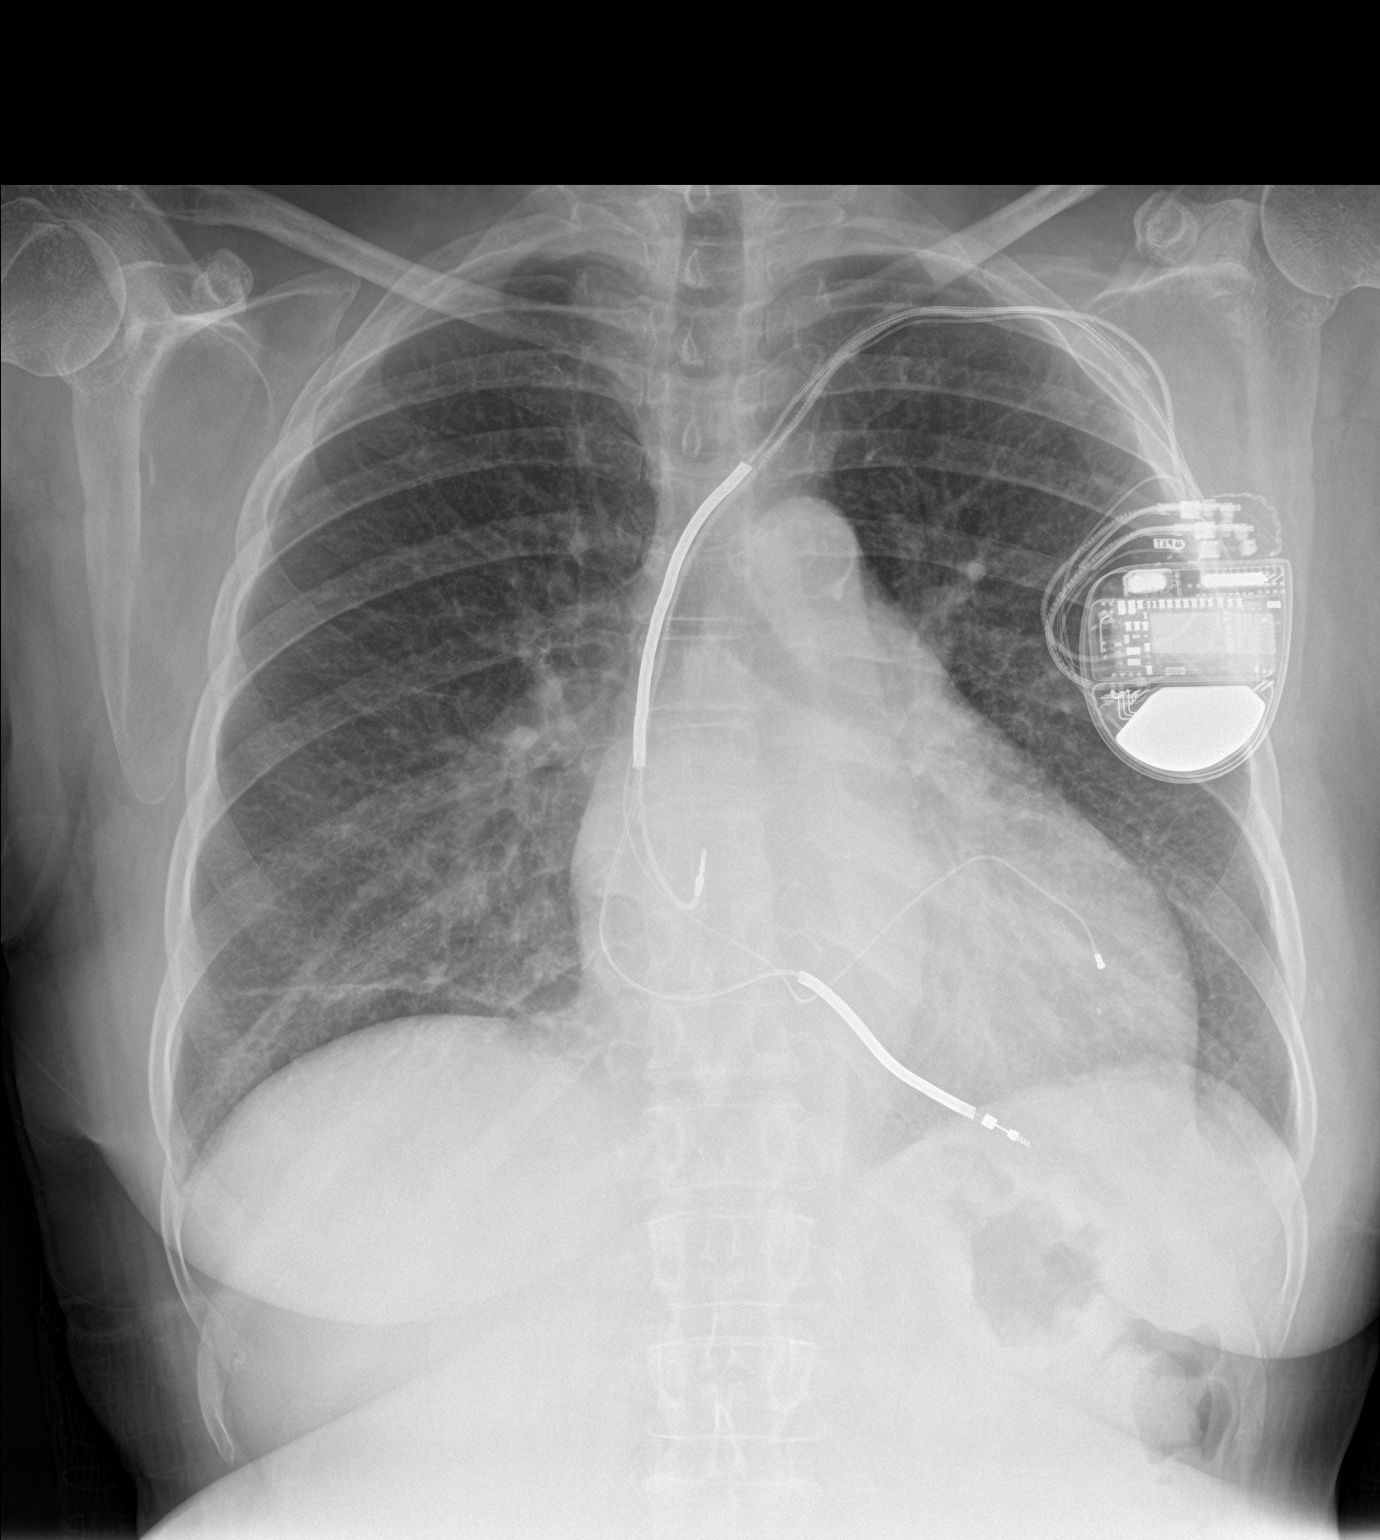

[chest lat]
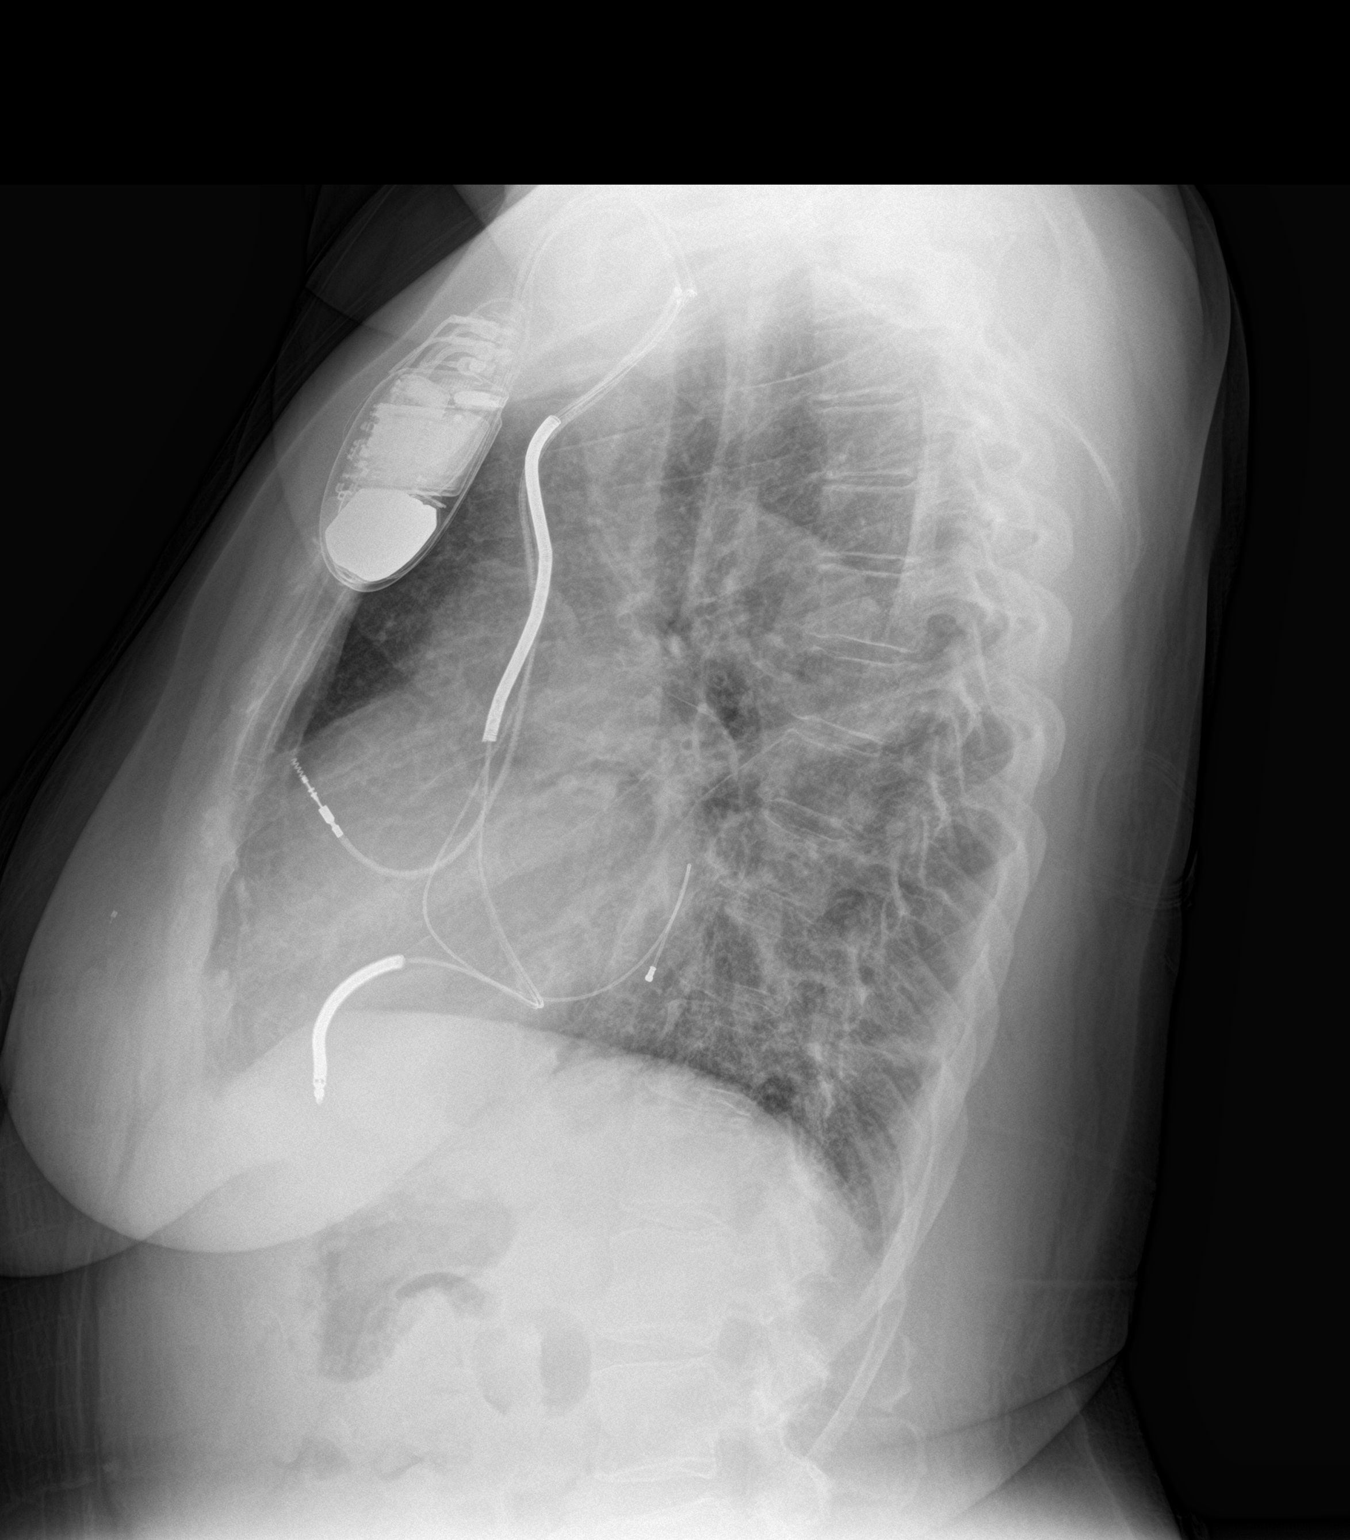

[2 of 2 positions shown; findings below may reference images not displayed]

FINDINGS: Stable cardiomediastinal silhouette. Left-sided pacemaker is
unchanged in position. No pneumothorax is noted. Right basilar
opacity noted on prior exam is nearly resolved with residual density
noted which may represent postinflammatory scarring. Left lung is
unremarkable. Bony thorax is unremarkable.
IMPRESSION: Significantly decreased right basilar opacity is noted consistent
with improved pneumonia, with some degree of residual inflammation
present or possibly postinflammatory scarring.

## 2020-01-08 ENCOUNTER — Other Ambulatory Visit: Payer: Self-pay | Admitting: Nurse Practitioner

## 2020-01-08 DIAGNOSIS — K7469 Other cirrhosis of liver: Secondary | ICD-10-CM | POA: Diagnosis not present

## 2020-01-08 DIAGNOSIS — R188 Other ascites: Secondary | ICD-10-CM

## 2020-01-15 ENCOUNTER — Other Ambulatory Visit: Payer: Medicare Other

## 2020-01-19 ENCOUNTER — Telehealth: Payer: Self-pay

## 2020-01-19 ENCOUNTER — Ambulatory Visit (INDEPENDENT_AMBULATORY_CARE_PROVIDER_SITE_OTHER): Payer: Medicare Other

## 2020-01-19 DIAGNOSIS — Z95810 Presence of automatic (implantable) cardiac defibrillator: Secondary | ICD-10-CM | POA: Diagnosis not present

## 2020-01-19 DIAGNOSIS — I5032 Chronic diastolic (congestive) heart failure: Secondary | ICD-10-CM

## 2020-01-19 NOTE — Telephone Encounter (Signed)
Remote ICM transmission received.  Attempted call to patient regarding ICM remote transmission and no answer.  Voice mail has not been set up.   

## 2020-01-19 NOTE — Progress Notes (Signed)
EPIC Encounter for ICM Monitoring  Patient Name: Morgan Clay is a 74 y.o. female Date: 01/19/2020 Primary Care Physican: Billie Ruddy, MD Primary Cardiologist:McLean Electrophysiologist: Caryl Comes Nephrologist: [name] Bi-V Pacing:99.7% 8/23/2021OfficeWeight:148lbs  Clinical Status (29-Dec-2019 to 19-Jan-2020) AT/AF                118 Episodes Time in AT/AF   2.6 hr/day (10.7%) Longest AT/AF    7 hours VT-NS (>4 beats, >182 bpm) 1   Attempted call to patient and unable to reach. Transmission reviewed.  Optivol thoracic impedance suggesting possible fluid accumulation starting 01/12/2020.  Torsemide dosage decrease on 01/01/2020 due to higher creatinine.  Prescribed:  Torsemide20 mgTake 2 tablets (40 mg total) daily.  Spironolactone 25 mg take 0.5 tablet (12.5 mg total) daily  Eliquis 5 mg take 1 tablet twice a day  Labs: 01/06/2020 Creatinine 2.57, BUN 69, Potassium 4.9, Sodium 130, GFR 18-21 12/29/2019 Creatinine 2.88, BUN 80, Potassium 3.9, Sodium 132, GFR 16-18 12/11/2019 Potassium 3.6, Sodium 136 12/05/2019 Creatinine 1.94, BUN 35, Potassium 4.0, Sodium 136, GFR 25-29  10/02/2019 Creatinine 1.69, BUN 34, Potassium 3.6, Sodium 135  07/23/2019 Creatinine1.89, BUN39, Potassium3.6, Sodium139, ZOX09.60  A complete set of results can be found in Results Review.  Recommendations: Unable to reach.    Follow-up plan: ICM clinic phone appointment on9/21/2021 to recheck fluid levels. 91 day device clinic remote transmission9/28/2021.  EP/Cardiology Office Visits:03/01/2020 with Dr.McLean.Last EP telehealth visit was 07/24/2019 (no recall)  Copy of ICM check sent to [place].   3 month ICM trend: 01/19/2020    1 Year ICM trend:       [name], RN 01/19/2020 4:29 PM

## 2020-01-23 ENCOUNTER — Other Ambulatory Visit: Payer: Self-pay

## 2020-01-23 ENCOUNTER — Ambulatory Visit
Admission: RE | Admit: 2020-01-23 | Discharge: 2020-01-23 | Disposition: A | Discharge status: Home / Self Care | Payer: Medicare Other | Source: Ambulatory Visit | Attending: Family Medicine | Admitting: Family Medicine

## 2020-01-23 DIAGNOSIS — Z1231 Encounter for screening mammogram for malignant neoplasm of breast: Secondary | ICD-10-CM | POA: Diagnosis not present

## 2020-01-24 DIAGNOSIS — H40033 Anatomical narrow angle, bilateral: Secondary | ICD-10-CM | POA: Diagnosis not present

## 2020-01-24 DIAGNOSIS — H04123 Dry eye syndrome of bilateral lacrimal glands: Secondary | ICD-10-CM | POA: Diagnosis not present

## 2020-02-03 ENCOUNTER — Ambulatory Visit (INDEPENDENT_AMBULATORY_CARE_PROVIDER_SITE_OTHER): Payer: Medicare Other | Admitting: Emergency Medicine

## 2020-02-03 DIAGNOSIS — I428 Other cardiomyopathies: Secondary | ICD-10-CM | POA: Diagnosis not present

## 2020-02-04 ENCOUNTER — Other Ambulatory Visit: Payer: Self-pay

## 2020-02-04 ENCOUNTER — Ambulatory Visit (INDEPENDENT_AMBULATORY_CARE_PROVIDER_SITE_OTHER): Payer: Medicare Other | Admitting: Family Medicine

## 2020-02-04 ENCOUNTER — Ambulatory Visit (INDEPENDENT_AMBULATORY_CARE_PROVIDER_SITE_OTHER): Payer: Medicare Other

## 2020-02-04 ENCOUNTER — Telehealth: Payer: Self-pay

## 2020-02-04 ENCOUNTER — Encounter: Payer: Self-pay | Admitting: Family Medicine

## 2020-02-04 VITALS — BP 128/60 | HR 80 | Temp 98.5°F | Wt 144.2 lb

## 2020-02-04 DIAGNOSIS — I5032 Chronic diastolic (congestive) heart failure: Secondary | ICD-10-CM

## 2020-02-04 DIAGNOSIS — N184 Chronic kidney disease, stage 4 (severe): Secondary | ICD-10-CM | POA: Diagnosis not present

## 2020-02-04 DIAGNOSIS — R5383 Other fatigue: Secondary | ICD-10-CM

## 2020-02-04 DIAGNOSIS — I509 Heart failure, unspecified: Secondary | ICD-10-CM | POA: Diagnosis not present

## 2020-02-04 DIAGNOSIS — Z862 Personal history of diseases of the blood and blood-forming organs and certain disorders involving the immune mechanism: Secondary | ICD-10-CM | POA: Diagnosis not present

## 2020-02-04 DIAGNOSIS — M545 Low back pain, unspecified: Secondary | ICD-10-CM

## 2020-02-04 DIAGNOSIS — Z95810 Presence of automatic (implantable) cardiac defibrillator: Secondary | ICD-10-CM

## 2020-02-04 DIAGNOSIS — E039 Hypothyroidism, unspecified: Secondary | ICD-10-CM | POA: Diagnosis not present

## 2020-02-04 LAB — CUP PACEART REMOTE DEVICE CHECK
Battery Remaining Longevity: 25 mo
Battery Voltage: 2.94 V
Brady Statistic AP VP Percent: 9.74 %
Brady Statistic AP VS Percent: 0.02 %
Brady Statistic AS VP Percent: 89.49 %
Brady Statistic AS VS Percent: 0.75 %
Brady Statistic RA Percent Paced: 9.05 %
Brady Statistic RV Percent Paced: 99.21 %
Date Time Interrogation Session: 20210928043626
HighPow Impedance: 44 Ohm
HighPow Impedance: 64 Ohm
Implantable Lead Implant Date: 20080818
Implantable Lead Implant Date: 20080818
Implantable Lead Implant Date: 20080818
Implantable Lead Location: 753858
Implantable Lead Location: 753859
Implantable Lead Location: 753860
Implantable Lead Model: 4193
Implantable Lead Model: 5076
Implantable Lead Model: 6947
Implantable Pulse Generator Implant Date: 20170531
Lead Channel Impedance Value: 247 Ohm
Lead Channel Impedance Value: 342 Ohm
Lead Channel Impedance Value: 380 Ohm
Lead Channel Impedance Value: 399 Ohm
Lead Channel Impedance Value: 4047 Ohm
Lead Channel Impedance Value: 4047 Ohm
Lead Channel Pacing Threshold Amplitude: 0.875 V
Lead Channel Pacing Threshold Amplitude: 1.125 V
Lead Channel Pacing Threshold Amplitude: 1.375 V
Lead Channel Pacing Threshold Pulse Width: 0.4 ms
Lead Channel Pacing Threshold Pulse Width: 0.4 ms
Lead Channel Pacing Threshold Pulse Width: 0.4 ms
Lead Channel Sensing Intrinsic Amplitude: 3.125 mV
Lead Channel Sensing Intrinsic Amplitude: 3.125 mV
Lead Channel Sensing Intrinsic Amplitude: 9.75 mV
Lead Channel Sensing Intrinsic Amplitude: 9.75 mV
Lead Channel Setting Pacing Amplitude: 1.75 V
Lead Channel Setting Pacing Amplitude: 2 V
Lead Channel Setting Pacing Amplitude: 2.5 V
Lead Channel Setting Pacing Pulse Width: 0.4 ms
Lead Channel Setting Pacing Pulse Width: 0.4 ms
Lead Channel Setting Sensing Sensitivity: 0.3 mV

## 2020-02-04 NOTE — Progress Notes (Signed)
EPIC Encounter for ICM Monitoring  Patient Name: Morgan Clay is a 74 y.o. female Date: 02/04/2020 Primary Care Physican: Billie Ruddy, MD Primary Cardiologist:McLean Electrophysiologist: Caryl Comes Nephrologist: [name] Bi-V Pacing:99.7% 8/23/2021OfficeWeight:148lbs  Clinical Status (19-Jan-2020 to 03-Feb-2020) AT/AF 41 episodes  Time in AT/AF 22.9 hr/day (95.2%) Increased from 10.7% on previous 01/19/20 remote transmission Observations (2) (19-Jan-2020 to 03-Feb-2020)  AT/AF >= 6 hr for 15 days.  Possible OptiVol fluid accumulation: 28-Jan-2020 -- ongoing.   Transmission reviewed.  Message sent to [facility] Triage to review report for increased AT/AF %.   Optivol thoracic impedancesuggesting fluid level improvement and trending close to baseline.  Torsemide dosage decrease on 01/01/2020 due to higher creatinine.  Prescribed:  Torsemide20 mgTake 2 tablets (40 mg total) daily.  Spironolactone 25 mg take 0.5 tablet (12.5 mg total) daily  Eliquis 5 mg take 1 tablet twice a day  Labs: 01/06/2020 Creatinine 2.57, BUN 69, Potassium 4.9, Sodium 130, GFR 18-21 12/29/2019 Creatinine 2.88, BUN 80, Potassium 3.9, Sodium 132, GFR 16-18 12/11/2019 Potassium3.6, Sodium136 12/05/2019 Creatinine1.94, BUN35, Potassium4.0, Sodium136, GFR25-29  10/02/2019 Creatinine1.69, BUN34, Potassium3.6, Sodium135 07/23/2019 Creatinine1.89, BUN39, Potassium3.6, Sodium139, TCY81.85  A complete set of results can be found in Results Review.  Recommendations: No changes  Follow-up plan: ICM clinic phone appointment on10/25/2021. 91 day device clinic remote transmission12/28/2021.  EP/Cardiology Office Visits:03/01/2020 with Dr.McLean.Last EP telehealth visit was 07/24/2019 (no recall)  Copy of ICM check sent to [place].    3 month ICM trend: 02/03/2020    1 Year ICM trend:       [name], RN 02/04/2020 8:00 AM

## 2020-02-04 NOTE — Telephone Encounter (Signed)
Message received from [facility] noting patient has increased amount of AF, burden ~ 95.2%. OAC, Eliquis. Per Optivol, patient is retaining fluid at this time.   Called patient to discuss any s/s and medication compliance.   Patient reports of generalized weakness fatigue and "cold." Patient states she knew something wasn't right but wasn't sure. She is currently on the way to her PCP for evaluation. Advised patient I will forward this to Dr. Caryl Comes for review and the AF Clinic. Reports compliance with medications including  Eliquis 5 mg BID, Coreg 25 mg BID, Torsemide 20 mg BID.

## 2020-02-04 NOTE — Progress Notes (Signed)
Subjective:    Patient ID: Morgan Clay, female    DOB: [birth year], 74 y.o.   MRN: [id number]  No chief complaint on file.   HPI Pt is a 74 yo female with pmh sig for chronic diastolic CHF, nonischemic cardiomyopathy, atrial flutter, HTN, DM 2, hypothyroidism, h/o hep C, asthma, COPD, OSA, pulmonary HTN, diverticulitis, CKD 4, h/o MDD, anemia, gout who was seen today for ongoing concerns.  Pt endorses feeling tired/having no energy "for a while".  Pt feels sleepy every day at 2 PM.  Goes to bed between 9-10 PM.  States stays in the bed as long as she can.  Pt states over the past she had a sleep study but could not tolerate CPAP as it caused nasal drainage.    Pt endorses mood changes depending on the day.  Pt taking Synthroid 137 mcg every morning with her coffee.  Pt has a history of CHF, denies increased SOB, LE edema.   Pt also notes R sided low back pain.  Worse when up moving.  Does not radiate into LEs. At times has to move her legs at night when in the bed.  Pt notes decreased appetite but she is afraid to eat anything as it may cause gout flare.     Past Medical History:  Diagnosis Date  . A-fib ([place])    on Eliquis  . AICD (automatic cardioverter/defibrillator) present 10/06/2015  . Anemia   . ARF (acute renal failure) ([place])   . Arthritis    "qwhere" (01/03/2018)  . Asthma    reports mild asthma since childhood - had COPD on dx list from prior PCP  . Chronic bronchitis ([place])    "get it most years; not this past year though" (01/03/2018)  . Chronic kidney disease    "? stage;  followed by [facility]; [name]" (01/03/2018)  . Chronic systolic congestive heart failure ([place])   . DEPRESSION 12/17/2009   Annotation: PHQ-9 score = 14 done on 12/17/2009 Qualifier: Diagnosis of  By: Hassell Done FNP, Tori Milks    . Diabetes mellitus without complication (HCC)    DIET CONTROLLED   . Diverticulosis   . Enteritis   . FIBROIDS, UTERUS 03/05/2008  . GI bleeding 01/03/2018  . Glaucoma    . Gout   . Hepatitis C    HEPATITIS C - s/p treatment with Harvoni, saw hepatology, Dawn Drazek  . History of blood transfusion ~ 11/2017  . Hyperlipemia 12/06/2012  . HYPERTENSION, BENIGN 04/24/2007  . Hypothyroidism   . LBBB (left bundle branch block)   . Lower GI bleeding    "been dealing w/it since 07/2017" (01/03/2018)  . Nonischemic cardiomyopathy ([place])   . OBESITY 05/27/2009  . OBSTRUCTIVE SLEEP APNEA 11/14/2007   no CPAP  . OSTEOPENIA 09/30/2008  . Pain and swelling of left upper extremity 08/08/2017  . Personal history of other infectious and parasitic disease    Hepatitis B  . Pneumonia    "several times" (01/03/2018)  . Pulmonary edema, acute ([place]) 08/09/2017    No Known Allergies  ROS General: Denies fever, chills, night sweats, changes in weight, changes in appetite + cold intolerance, fatigue HEENT: Denies headaches, ear pain, changes in vision, rhinorrhea, sore throat  + decreased appetite, change in taste CV: Denies CP, palpitations, SOB, orthopnea Pulm: Denies SOB, cough, wheezing  +SOB at baseline GI: Denies abdominal pain, nausea, vomiting, diarrhea, constipation GU: Denies dysuria, hematuria, frequency, vaginal discharge Msk: Denies muscle cramps, joint pains  +Low back pain Neuro:  Denies weakness, numbness, tingling  +leg movement at night Skin: Denies rashes, bruising Psych: Denies depression, anxiety, hallucinations  + variable mood    Objective:    Blood pressure 128/60, pulse 80, temperature 98.5 F (36.9 C), temperature source Oral, weight 144 lb 3.2 oz (65.4 kg), SpO2 99 %.   Gen. Pleasant, well-nourished, in no distress, normal affect   HEENT: [place]/AT, face symmetric, conjunctiva clear, no scleral icterus, PERRLA, EOMI, nares patent without drainage Lungs: no accessory muscle use, CTAB, no wheezes or rales Cardiovascular: RRR, no m/r/g, no peripheral edema Musculoskeletal: No deformities, no cyanosis or clubbing, normal tone Neuro:  A&Ox3, CN II-XII  intact, normal gait Skin:  Warm, no lesions/ rash   Wt Readings from Last 3 Encounters:  02/04/20 144 lb 3.2 oz (65.4 kg)  12/29/19 148 lb (67.1 kg)  12/11/19 141 lb (64 kg)    Lab Results  Component Value Date   WBC 5.2 12/05/2019   HGB 10.9 (L) 12/11/2019   HCT 32.0 (L) 12/11/2019   PLT 283 12/05/2019   GLUCOSE 118 (H) 01/06/2020   CHOL 89 07/23/2019   TRIG 97.0 07/23/2019   HDL 36.40 (L) 07/23/2019   LDLCALC 33 07/23/2019   ALT 23 03/05/2018   AST 29 03/05/2018   NA 130 (L) 01/06/2020   K 4.9 01/06/2020   CL 98 01/06/2020   CREATININE 2.57 (H) 01/06/2020   BUN 69 (H) 01/06/2020   CO2 20 (L) 01/06/2020   TSH 3.87 07/17/2019   INR 1.3 (H) 12/05/2019   HGBA1C 6.0 07/17/2019   MICROALBUR 7.7 (H) 11/24/2013    Assessment/Plan:  Fatigue, unspecified type  -Discussed various causes including hypothyroidism, medications, vitamin deficiencies, electrolyte abnormalities, OSA, CHF exacerbation - Plan: CBC with Differential/Platelet, CMP with eGFR([facility]), Vitamin D, 25-hydroxy, TSH, Brain Natriuretic Peptide, Hemoglobin A1c, Hemoglobin A1c, Brain Natriuretic Peptide, TSH, Vitamin D, 25-hydroxy, CMP with eGFR([facility]), CBC with Differential/Platelet  History of anemia  - Plan: CBC with Differential/Platelet, Vitamin B12, Folate  Acquired hypothyroidism  -Continue levothyroxine 137 mcg every morning -Patient advised to take medication 30 minutes prior to eating or taking other medications. - Plan: TSH  CKD (chronic kidney disease) stage 4, GFR 15-29 ml/min (HCC)  -Continue follow-up with nephrology -Avoid nephrotoxic meds -Renally dose medications - Plan: CMP with eGFR([facility])  Chronic diastolic CHF (congestive heart failure) (HCC) -Stable -Continue to monitor sodium intake -Continue current medications including Coreg 25 mg BID, BiDil 20-37.5 mg 3 times daily, spironolactone 25 mg, torsemide 40 mg daily -Continue follow-up with cardiology  Right-sided low back pain  without sciatica, unspecified chronicity -Discussed supportive care -Consider imaging for continued or worsening symptoms  F/u prn  Grier Mitts, MD

## 2020-02-04 NOTE — Patient Instructions (Signed)
Fatigue If you have fatigue, you feel tired all the time and have a lack of energy or a lack of motivation. Fatigue may make it difficult to start or complete tasks because of exhaustion. In general, occasional or mild fatigue is often a normal response to activity or life. However, long-lasting (chronic) or extreme fatigue may be a symptom of a medical condition. Follow these instructions at home: General instructions  Watch your fatigue for any changes.  Go to bed and get up at the same time every day.  Avoid fatigue by pacing yourself during the day and getting enough sleep at night.  Maintain a healthy weight. Medicines  Take over-the-counter and prescription medicines only as told by your health care provider.  Take a multivitamin, if told by your health care provider.  Do not use herbal or dietary supplements unless they are approved by your health care provider. Activity   Exercise regularly, as told by your health care provider.  Use or practice techniques to help you relax, such as yoga, tai chi, meditation, or massage therapy. Eating and drinking   Avoid heavy meals in the evening.  Eat a well-balanced diet, which includes lean proteins, whole grains, plenty of fruits and vegetables, and low-fat dairy products.  Avoid consuming too much caffeine.  Avoid the use of alcohol.  Drink enough fluid to keep your urine pale yellow. Lifestyle  Change situations that cause you stress. Try to keep your work and personal schedule in balance.  Do not use any products that contain nicotine or tobacco, such as cigarettes and e-cigarettes. If you need help quitting, ask your health care provider.  Do not use drugs. Contact a health care provider if:  Your fatigue does not get better.  You have a fever.  You suddenly lose or gain weight.  You have headaches.  You have trouble falling asleep or sleeping through the night.  You feel angry, guilty, anxious, or  sad.  You are unable to have a bowel movement (constipation).  Your skin is dry.  You have swelling in your legs or another part of your body. Get help right away if:  You feel confused.  Your vision is blurry.  You feel faint or you pass out.  You have a severe headache.  You have severe pain in your abdomen, your back, or the area between your waist and hips (pelvis).  You have chest pain, shortness of breath, or an irregular or fast heartbeat.  You are unable to urinate, or you urinate less than normal.  You have abnormal bleeding, such as bleeding from the rectum, vagina, nose, lungs, or nipples.  You vomit blood.  You have thoughts about hurting yourself or others. If you ever feel like you may hurt yourself or others, or have thoughts about taking your own life, get help right away. You can go to your nearest emergency department or call:  Your local emergency services (911 in the [place]  A suicide crisis helpline, such as the [facility] at 9164775271. This is open 24 hours a day. Summary  If you have fatigue, you feel tired all the time and have a lack of energy or a lack of motivation.  Fatigue may make it difficult to start or complete tasks because of exhaustion.  Long-lasting (chronic) or extreme fatigue may be a symptom of a medical condition.  Exercise regularly, as told by your health care provider.  Change situations that cause you stress. Try to keep your  work and personal schedule in balance. This information is not intended to replace advice given to you by your health care provider. Make sure you discuss any questions you have with your health care provider. Document Revised: 11/13/2018 Document Reviewed: 01/17/2017 Elsevier Patient Education  2020 [employer].  Hypothyroidism  Hypothyroidism is when the thyroid gland does not make enough of certain hormones (it is underactive). The thyroid gland is a small gland  located in the lower front part of the neck, just in front of the windpipe (trachea). This gland makes hormones that help control how the body uses food for energy (metabolism) as well as how the heart and brain function. These hormones also play a role in keeping your bones strong. When the thyroid is underactive, it produces too little of the hormones thyroxine (T4) and triiodothyronine (T3). What are the causes? This condition may be caused by:  Hashimoto's disease. This is a disease in which the body's disease-fighting system (immune system) attacks the thyroid gland. This is the most common cause.  Viral infections.  Pregnancy.  Certain medicines.  Birth defects.  Past radiation treatments to the head or neck for cancer.  Past treatment with radioactive iodine.  Past exposure to radiation in the environment.  Past surgical removal of part or all of the thyroid.  Problems with a gland in the center of the brain (pituitary gland).  Lack of enough iodine in the diet. What increases the risk? You are more likely to develop this condition if:  You are female.  You have a family history of thyroid conditions.  You use a medicine called lithium.  You take medicines that affect the immune system (immunosuppressants). What are the signs or symptoms? Symptoms of this condition include:  Feeling as though you have no energy (lethargy).  Not being able to tolerate cold.  Weight gain that is not explained by a change in diet or exercise habits.  Lack of appetite.  Dry skin.  Coarse hair.  Menstrual irregularity.  Slowing of thought processes.  Constipation.  Sadness or depression. How is this diagnosed? This condition may be diagnosed based on:  Your symptoms, your medical history, and a physical exam.  Blood tests. You may also have imaging tests, such as an ultrasound or MRI. How is this treated? This condition is treated with medicine that replaces the  thyroid hormones that your body does not make. After you begin treatment, it may take several weeks for symptoms to go away. Follow these instructions at home:  Take over-the-counter and prescription medicines only as told by your health care provider.  If you start taking any new medicines, tell your health care provider.  Keep all follow-up visits as told by your health care provider. This is important. ? As your condition improves, your dosage of thyroid hormone medicine may change. ? You will need to have blood tests regularly so that your health care provider can monitor your condition. Contact a health care provider if:  Your symptoms do not get better with treatment.  You are taking thyroid replacement medicine and you: ? Sweat a lot. ? Have tremors. ? Feel anxious. ? Lose weight rapidly. ? Cannot tolerate heat. ? Have emotional swings. ? Have diarrhea. ? Feel weak. Get help right away if you have:  Chest pain.  An irregular heartbeat.  A rapid heartbeat.  Difficulty breathing. Summary  Hypothyroidism is when the thyroid gland does not make enough of certain hormones (it is underactive).  When the  thyroid is underactive, it produces too little of the hormones thyroxine (T4) and triiodothyronine (T3).  The most common cause is Hashimoto's disease, a disease in which the body's disease-fighting system (immune system) attacks the thyroid gland. The condition can also be caused by viral infections, medicine, pregnancy, or past radiation treatment to the head or neck.  Symptoms may include weight gain, dry skin, constipation, feeling as though you do not have energy, and not being able to tolerate cold.  This condition is treated with medicine to replace the thyroid hormones that your body does not make. This information is not intended to replace advice given to you by your health care provider. Make sure you discuss any questions you have with your health care  provider. Document Revised: 04/06/2017 Document Reviewed: 04/04/2017 Elsevier Patient Education  2020 [employer].

## 2020-02-05 LAB — COMPLETE METABOLIC PANEL WITH GFR
AG Ratio: 1.3 (calc) (ref 1.0–2.5)
ALT: 11 U/L (ref 6–29)
AST: 21 U/L (ref 10–35)
Albumin: 4.7 g/dL (ref 3.6–5.1)
Alkaline phosphatase (APISO): 83 U/L (ref 37–153)
BUN/Creatinine Ratio: 27 (calc) — ABNORMAL HIGH (ref 6–22)
BUN: 60 mg/dL — ABNORMAL HIGH (ref 7–25)
CO2: 23 mmol/L (ref 20–32)
Calcium: 9.9 mg/dL (ref 8.6–10.4)
Chloride: 104 mmol/L (ref 98–110)
Creat: 2.24 mg/dL — ABNORMAL HIGH (ref 0.60–0.93)
GFR, Est African American: 25 mL/min/{1.73_m2} — ABNORMAL LOW (ref >=60)
GFR, Est Non African American: 21 mL/min/{1.73_m2} — ABNORMAL LOW (ref >=60)
Globulin: 3.6 g/dL (calc) (ref 1.9–3.7)
Glucose, Bld: 119 mg/dL — ABNORMAL HIGH (ref 65–99)
Potassium: 4.5 mmol/L (ref 3.5–5.3)
Sodium: 135 mmol/L (ref 135–146)
Total Bilirubin: 0.4 mg/dL (ref 0.2–1.2)
Total Protein: 8.3 g/dL — ABNORMAL HIGH (ref 6.1–8.1)

## 2020-02-05 LAB — CBC WITH DIFFERENTIAL/PLATELET
Absolute Monocytes: 636 cells/uL (ref 200–950)
Basophils Absolute: 42 cells/uL (ref 0–200)
Basophils Relative: 0.7 %
Eosinophils Absolute: 108 cells/uL (ref 15–500)
Eosinophils Relative: 1.8 %
HCT: 27 % — ABNORMAL LOW (ref 35.0–45.0)
Hemoglobin: 8.5 g/dL — ABNORMAL LOW (ref 11.7–15.5)
Lymphs Abs: 1254 cells/uL (ref 850–3900)
MCH: 28.2 pg (ref 27.0–33.0)
MCHC: 31.5 g/dL — ABNORMAL LOW (ref 32.0–36.0)
MCV: 89.7 fL (ref 80.0–100.0)
MPV: 10.8 fL (ref 7.5–12.5)
Monocytes Relative: 10.6 %
Neutro Abs: 3960 cells/uL (ref 1500–7800)
Neutrophils Relative %: 66 %
Platelets: 341 10*3/uL (ref 140–400)
RBC: 3.01 10*6/uL — ABNORMAL LOW (ref 3.80–5.10)
RDW: 24 % — ABNORMAL HIGH (ref 11.0–15.0)
Total Lymphocyte: 20.9 %
WBC: 6 10*3/uL (ref 3.8–10.8)

## 2020-02-05 LAB — BRAIN NATRIURETIC PEPTIDE: Brain Natriuretic Peptide: 437 pg/mL — ABNORMAL HIGH (ref <100)

## 2020-02-05 LAB — HEMOGLOBIN A1C
Hgb A1c MFr Bld: 6.1 % of total Hgb — ABNORMAL HIGH (ref <5.7)
Mean Plasma Glucose: 128 (calc)
eAG (mmol/L): 7.1 (calc)

## 2020-02-05 LAB — CBC MORPHOLOGY

## 2020-02-05 LAB — FOLATE: Folate: 10.6 ng/mL

## 2020-02-05 LAB — TSH: TSH: 3.11 mIU/L (ref 0.40–4.50)

## 2020-02-05 LAB — VITAMIN B12: Vitamin B-12: 1081 pg/mL (ref 200–1100)

## 2020-02-05 LAB — VITAMIN D 25 HYDROXY (VIT D DEFICIENCY, FRACTURES): Vit D, 25-Hydroxy: 22 ng/mL — ABNORMAL LOW (ref 30–100)

## 2020-02-05 NOTE — Telephone Encounter (Signed)
Called and spoke with patient.  She is agreeable to appt 02/06/20 @10 :30 am with Adline Peals, PA.

## 2020-02-05 NOTE — Telephone Encounter (Signed)
L. GM2u.  Can u arrange afib clinic for D[facility].  Thx.

## 2020-02-06 ENCOUNTER — Encounter (HOSPITAL_COMMUNITY): Payer: Self-pay | Admitting: Physician Assistant

## 2020-02-06 ENCOUNTER — Telehealth: Payer: Self-pay | Admitting: Family Medicine

## 2020-02-06 ENCOUNTER — Ambulatory Visit (HOSPITAL_COMMUNITY)
Admission: RE | Admit: 2020-02-06 | Discharge: 2020-02-06 | Disposition: A | Discharge status: Home / Self Care | Payer: Medicare Other | Source: Ambulatory Visit | Attending: Physician Assistant | Admitting: Physician Assistant

## 2020-02-06 ENCOUNTER — Other Ambulatory Visit: Payer: Self-pay

## 2020-02-06 ENCOUNTER — Ambulatory Visit
Admission: RE | Admit: 2020-02-06 | Discharge: 2020-02-06 | Disposition: A | Discharge status: Home / Self Care | Payer: Medicare Other | Source: Ambulatory Visit | Attending: Nurse Practitioner | Admitting: Nurse Practitioner

## 2020-02-06 VITALS — BP 160/66 | HR 76 | Ht 59.0 in | Wt 143.8 lb

## 2020-02-06 DIAGNOSIS — I4819 Other persistent atrial fibrillation: Secondary | ICD-10-CM | POA: Insufficient documentation

## 2020-02-06 DIAGNOSIS — I484 Atypical atrial flutter: Secondary | ICD-10-CM | POA: Diagnosis not present

## 2020-02-06 DIAGNOSIS — E559 Vitamin D deficiency, unspecified: Secondary | ICD-10-CM

## 2020-02-06 DIAGNOSIS — K7689 Other specified diseases of liver: Secondary | ICD-10-CM | POA: Diagnosis not present

## 2020-02-06 DIAGNOSIS — K7469 Other cirrhosis of liver: Secondary | ICD-10-CM

## 2020-02-06 DIAGNOSIS — D6869 Other thrombophilia: Secondary | ICD-10-CM

## 2020-02-06 DIAGNOSIS — K7460 Unspecified cirrhosis of liver: Secondary | ICD-10-CM | POA: Diagnosis not present

## 2020-02-06 DIAGNOSIS — K828 Other specified diseases of gallbladder: Secondary | ICD-10-CM | POA: Diagnosis not present

## 2020-02-06 DIAGNOSIS — R188 Other ascites: Secondary | ICD-10-CM

## 2020-02-06 HISTORY — DX: Other thrombophilia: D68.69

## 2020-02-06 HISTORY — DX: Other persistent atrial fibrillation: I48.19

## 2020-02-06 NOTE — Progress Notes (Signed)
Primary Care Physician: Billie Ruddy, MD Primary Cardiologist: Dr [name] Primary Electrophysiologist: Dr Caryl Comes Referring Physician: Dr Caryl Comes   Morgan Clay is a 74 y.o. female with a history of NICM s/p CRT-D, persistent atrial fibrillation, prior GI bleeding, HTN, DM, CKD stage III, atrial flutter who presents for follow up in the [facility]. Patient is on Eliquis for a CHADS2VASC score of 5. She was noted to be out of rhythm by remote device check since 01/19/20. She has had symptoms of SOB, fatigue, and generalized weakness. There were no specific triggers that she could identify. She denies any missed doses of anticoagulation.   Today, she denies symptoms of palpitations, chest pain, orthopnea, PND, lower extremity edema, dizziness, presyncope, syncope, snoring, daytime somnolence, bleeding, or neurologic sequela. The patient is tolerating medications without difficulties and is otherwise without complaint today.    Atrial Fibrillation Risk Factors:  she does not have symptoms or diagnosis of sleep apnea. she does not have a history of rheumatic fever. she does not have a history of alcohol use. The patient does not have a history of early familial atrial fibrillation or other arrhythmias.  she has a BMI of Body mass index is 29.04 kg/m.[name] Filed Weights   02/06/20 1058  Weight: 65.2 kg    Family History  Problem Relation Age of Onset  . Asthma Father   . Heart attack Father   . Asthma Sister   . Lung cancer Sister   . Heart attack Mother   . Stroke Brother   . Colon cancer Neg Hx   . Esophageal cancer Neg Hx   . Pancreatic cancer Neg Hx   . Stomach cancer Neg Hx   . Liver disease Neg Hx      Atrial Fibrillation Management history:  Previous antiarrhythmic drugs: amiodarone Previous cardioversions: 2016, 2018, 2019 Previous ablations: none CHADS2VASC score: 5 Anticoagulation history: Eliquis   Past Medical History:    Diagnosis Date  . A-fib ([place])    on Eliquis  . AICD (automatic cardioverter/defibrillator) present 10/06/2015  . Anemia   . ARF (acute renal failure) ([place])   . Arthritis    "qwhere" (01/03/2018)  . Asthma    reports mild asthma since childhood - had COPD on dx list from prior PCP  . Chronic bronchitis ([place])    "get it most years; not this past year though" (01/03/2018)  . Chronic kidney disease    "? stage;  followed by [facility]; [name]" (01/03/2018)  . Chronic systolic congestive heart failure ([place])   . DEPRESSION 12/17/2009   Annotation: PHQ-9 score = 14 done on 12/17/2009 Qualifier: Diagnosis of  By: Hassell Done FNP, Tori Milks    . Diabetes mellitus without complication (HCC)    DIET CONTROLLED   . Diverticulosis   . Enteritis   . FIBROIDS, UTERUS 03/05/2008  . GI bleeding 01/03/2018  . Glaucoma   . Gout   . Hepatitis C    HEPATITIS C - s/p treatment with Harvoni, saw hepatology, Dawn Drazek  . History of blood transfusion ~ 11/2017  . Hyperlipemia 12/06/2012  . HYPERTENSION, BENIGN 04/24/2007  . Hypothyroidism   . LBBB (left bundle branch block)   . Lower GI bleeding    "been dealing w/it since 07/2017" (01/03/2018)  . Nonischemic cardiomyopathy ([place])   . OBESITY 05/27/2009  . OBSTRUCTIVE SLEEP APNEA 11/14/2007   no CPAP  . OSTEOPENIA 09/30/2008  . Pain and swelling of left upper extremity 08/08/2017  .  Personal history of other infectious and parasitic disease    Hepatitis B  . Pneumonia    "several times" (01/03/2018)  . Pulmonary edema, acute ([place]) 08/09/2017   Past Surgical History:  Procedure Laterality Date  . APPENDECTOMY    . CARDIAC CATHETERIZATION    . CARDIOVERSION N/A 12/14/2014   Procedure: CARDIOVERSION;  Surgeon: Dorothy Spark, MD;  Location[facility] ENDOSCOPY;  Service: Cardiovascular;  Laterality: N/A;  . CARDIOVERSION N/A 02/26/2017   Procedure: CARDIOVERSION;  Surgeon: Evans Lance, MD;  Location: [place] CV LAB;  Service: Cardiovascular;   Laterality: N/A;  . CARDIOVERSION N/A 07/24/2017   Procedure: CARDIOVERSION;  Surgeon: Thayer Headings, MD;  Location: [facility] ENDOSCOPY;  Service: Cardiovascular;  Laterality: N/A;  . COLONOSCOPY N/A 11/08/2017   Procedure: COLONOSCOPY;  Surgeon: Milus Banister, MD;  Location: [facility] ENDOSCOPY;  Service: Endoscopy;  Laterality: N/A;  . DILATION AND CURETTAGE OF UTERUS    . EP IMPLANTABLE DEVICE N/A 10/06/2015   Procedure: BIV ICD Generator Changeout;  Surgeon: [name], MD;  Location: [place] CV LAB;  Service: Cardiovascular;  Laterality: N/A;  . ESOPHAGOGASTRODUODENOSCOPY N/A 10/26/2017   Procedure: ESOPHAGOGASTRODUODENOSCOPY (EGD);  Surgeon: Ladene Artist, MD;  Location: [facility] ENDOSCOPY;  Service: Endoscopy;  Laterality: N/A;  . ESOPHAGOGASTRODUODENOSCOPY (EGD) WITH PROPOFOL N/A 11/07/2017   Procedure: ESOPHAGOGASTRODUODENOSCOPY (EGD) WITH PROPOFOL;  Surgeon: Milus Banister, MD;  Location: [facility] ENDOSCOPY;  Service: Endoscopy;  Laterality: N/A;  . HOT HEMOSTASIS N/A 10/26/2017   Procedure: HOT HEMOSTASIS (ARGON PLASMA COAGULATION/BICAP);  Surgeon: Ladene Artist, MD;  Location: [facility] ENDOSCOPY;  Service: Endoscopy;  Laterality: N/A;  . INSERT / REPLACE / REMOVE PACEMAKER  ?2008  . POLYPECTOMY  11/08/2017   Procedure: POLYPECTOMY;  Surgeon: Milus Banister, MD;  Location: [facility] ENDOSCOPY;  Service: Endoscopy;;  . PROCTOSCOPY N/A 05/22/2018   Procedure: RIGID PROCTOSCOPY;  Surgeon: Michael Boston, MD;  Location: [facility] ORS;  Service: General;  Laterality: N/A;  . RIGHT HEART CATH N/A 02/13/2018   Procedure: RIGHT HEART CATH;  Surgeon: [name], MD;  Location: [place] CV LAB;  Service: Cardiovascular;  Laterality: N/A;  . RIGHT/LEFT HEART CATH AND CORONARY ANGIOGRAPHY N/A 12/11/2019   Procedure: RIGHT/LEFT HEART CATH AND CORONARY ANGIOGRAPHY;  Surgeon: [name], MD;  Location: [place] CV LAB;  Service: Cardiovascular;  Laterality: N/A;  . TUBAL LIGATION    . XI ROBOTIC ASSISTED LOWER  ANTERIOR RESECTION N/A 05/22/2018   Procedure: XI ROBOTIC ASSISTED SIGMOID COLOECTOMY MOBILIZATION OF SPLENIC FLEXURE, FIREFLY ASSESSMENT OF PERFUSION;  Surgeon: Michael Boston, MD;  Location: [facility] ORS;  Service: General;  Laterality: N/A;  ERAS PATHWAY    Current Outpatient Medications  Medication Sig Dispense Refill  . allopurinol (ZYLOPRIM) 300 MG tablet Take 300 mg by mouth daily.     [name] apixaban (ELIQUIS) 5 MG TABS tablet Take 1 tablet (5 mg total) by mouth 2 (two) times daily. 60 tablet 11  . calcitRIOL (ROCALTROL) 0.25 MCG capsule Take 0.25 mcg by mouth 3 (three) times a week.    . carvedilol (COREG) 25 MG tablet Take 25 mg by mouth 2 (two) times daily with a meal.    . colchicine 0.6 MG tablet Take 0.6 mg by mouth daily as needed (gout flares).   0  . gabapentin (NEURONTIN) 300 MG capsule Take 1 capsule (300 mg total) by mouth 2 (two) times daily. 180 capsule 2  . isosorbide-hydrALAZINE (BIDIL) 20-37.5 MG tablet Take 1 tablet by mouth 3 (  three) times daily. 270 tablet 2  . levothyroxine (SYNTHROID) 137 MCG tablet Take 137 mcg by mouth daily before breakfast.    . nitroGLYCERIN (NITROSTAT) 0.4 MG SL tablet Place 0.4 mg under the tongue every 5 (five) minutes as needed for chest pain.    [name] sertraline (ZOLOFT) 50 MG tablet Take 1 tablet (50 mg total) by mouth daily. (Patient taking differently: Take 75 mg by mouth daily. ) 30 tablet 3  . spironolactone (ALDACTONE) 25 MG tablet Take 0.5 tablets (12.5 mg total) by mouth daily. 45 tablet 3  . torsemide (DEMADEX) 20 MG tablet Take 2 tablets (40 mg total) by mouth daily. 180 tablet 3  . traZODone (DESYREL) 50 MG tablet Take 50 mg by mouth at bedtime.     No current facility-administered medications for this encounter.    No Known Allergies  Social History   Socioeconomic History  . Marital status: Divorced    Spouse name: Not on file  . Number of children: 1  . Years of education: 58  . Highest education level: Not on file  Occupational  History  . Not on file  Tobacco Use  . Smoking status: Never Smoker  . Smokeless tobacco: Never Used  Vaping Use  . Vaping Use: Never used  Substance and Sexual Activity  . Alcohol use: Yes    Comment: 01/03/2018 "couple drinks/month"  . Drug use: Yes    Types: Marijuana    Comment: VERY INTERMITTENT   . Sexual activity: Not Currently  Other Topics Concern  . Not on file  Social History Narrative   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      Home Situation: lives alone      Spiritual Beliefs: Christian - Baptist      Lifestyle: no regular exercise; poor diet      She is divorced at least one adult child   Never smoker    rare alcohol to occasional at best    no substance abuse         Social Determinants of Radio broadcast assistant Strain:   . Difficulty of Paying Living Expenses: Not on file  Food Insecurity:   . Worried About Charity fundraiser in the Last Year: Not on file  . Ran Out of Food in the Last Year: Not on file  Transportation Needs:   . Lack of Transportation (Medical): Not on file  . Lack of Transportation (Non-Medical): Not on file  Physical Activity:   . Days of Exercise per Week: Not on file  . Minutes of Exercise per Session: Not on file  Stress:   . Feeling of Stress : Not on file  Social Connections:   . Frequency of Communication with Friends and Family: Not on file  . Frequency of Social Gatherings with Friends and Family: Not on file  . Attends Religious Services: Not on file  . Active Member of Clubs or Organizations: Not on file  . Attends Archivist Meetings: Not on file  . Marital Status: Not on file  Intimate Partner Violence:   . Fear of Current or Ex-Partner: Not on file  . Emotionally Abused: Not on file  . Physically Abused: Not on file  . Sexually Abused: Not on file     ROS- All systems are reviewed and negative except as per the HPI above.  Physical Exam: Vitals:   02/06/20 1058  BP: (!) 160/66    Pulse: 76  Weight: 65.2 kg  Height: 4\' 11"  (1.499 m)    GEN- The patient is well appearing, alert and oriented x 3 today.   Head- normocephalic, atraumatic Eyes-  Sclera clear, conjunctiva pink Ears- hearing intact Oropharynx- clear Neck- supple  Lungs- Clear to ausculation bilaterally, normal work of breathing Heart- Regular rate and rhythm, no murmurs, rubs or gallops  GI- soft, NT, ND, + BS Extremities- no clubbing, cyanosis, or edema MS- no significant deformity or atrophy Skin- no rash or lesion Psych- euthymic mood, full affect Neuro- strength and sensation are intact  Wt Readings from Last 3 Encounters:  02/06/20 65.2 kg  02/04/20 65.4 kg  12/29/19 67.1 kg    EKG today demonstrates V paced rhythm with underlying slow atrial flutter HR 76, QRS 136, QTc 501  Echo 12/29/19 demonstrated  1. Left ventricular ejection fraction, by estimation, is 50 to 55%. The  left ventricle has low normal function. The left ventricle has no regional  wall motion abnormalities. There is moderate left ventricular hypertrophy.  Left ventricular diastolic  parameters are consistent with Grade II diastolic dysfunction  (pseudonormalization).  2. Right ventricular systolic function is mildly reduced. The right  ventricular size is normal. There is normal pulmonary artery systolic  pressure. The estimated right ventricular systolic pressure is 31.4 mmHg.  3. Left atrial size was severely dilated.  4. The mitral valve is normal in structure. Mild mitral valve  regurgitation. No evidence of mitral stenosis.  5. The aortic valve is tricuspid. Aortic valve regurgitation is moderate.  No aortic stenosis is present.  6. No holodiastolic flow reversal in the descending thoracic aorta  doppler signal.  7. The inferior vena cava is normal in size with greater than 50%  respiratory variability, suggesting right atrial pressure of 3 mmHg.   Epic records are reviewed at length  today  CHA2DS2-VASc Score = 5  The patient's score is based upon: CHF History: 1 HTN History: 1 Age : 1 Diabetes History: 1 Stroke History: 0 Vascular Disease History: 0 Gender: 1      ASSESSMENT AND PLAN: 1. Persistent Atrial Fibrillation/atypical atrial flutter The patient's CHA2DS2-VASc score is 5, indicating a 7.2% annual risk of stroke.   Patient in persistent afib since 01/19/20 We discussed therapeutic options today. Will arrange for DCCV. Recent labs reviewed.  If she has early return of afib or flutter, could consider resuming amiodarone which she tolerated in the past. Unclear why it was discontinued.  Continue Eliquis 5 mg BID Continue Coreg 25 mg BID  2. Secondary Hypercoagulable State (ICD10:  D68.69) The patient is at significant risk for stroke/thromboembolism based upon her CHA2DS2-VASc Score of 5.  Continue Apixaban (Eliquis).   3. NICM S/p CRT-D, followed in the [facility]. BNP 02/04/20 437 Per device/OptiVol, fluid status has actually improved.  4. HTN Elevated today, would reassess in SR.   Follow up in the [facility] as scheduled.    [facility] [address] [place], [place] [address] [phone] 02/06/2020 2:36 PM

## 2020-02-06 NOTE — Telephone Encounter (Signed)
-----   Message from Billie Ruddy, MD sent at 02/06/2020  9:11 AM EDT ----- Your hemoglobin A1c is 6.9% which places you in the prediabetic range.  6.5% and greater is diabetes.  You should work to decrease the amount of sweets and carbohydrates you're eating as well as increasing your physical activity to lose weight. Your BNP which correlates to heart failure was elevated at 437.  Continue torsemide and keep follow-up appointment with cardiology today.  Your vitamin Hilliary Jock level came back low at 22 (nml>30).  For some people, low vitamin Jurline Folger can make you feel like you have no energy.  An rx for Ergocalciferol 50,000 IU was sent to your pharmacy.  You are to take 1 pill per week for the next 12 weeks.  After completing the weekly dose you can then take OTC Vitamin Lonny Eisen 1,000-2,000 IU daily.  We can then recheck your Vitamin Roise Emert level.  Creatinine mildly improved.  Anemia noted as hemoglobin 8.5 2/2 CKD and h/o iron def.  Will schedule f/u  with Heme/Onc for Feraheme.

## 2020-02-06 NOTE — Patient Instructions (Signed)
Cardioversion scheduled for Thursday, October 7th  - Arrive at the [facility] and go to admitting at   - Do not eat or drink anything after midnight the night prior to your procedure.  - Take all your morning medication (except diabetic medications) with a sip of water prior to arrival.  - You will not be able to drive home after your procedure.  - Do NOT miss any doses of your blood thinner - if you should miss a dose please notify our office immediately.  - If you feel as if you go back into normal rhythm prior to scheduled cardioversion, please notify our office immediately. If your procedure is canceled in the cardioversion suite you will be charged a cancellation fee.

## 2020-02-06 NOTE — H&P (Deleted)
  The note originally documented on this encounter has been moved the the encounter in which it belongs.  

## 2020-02-06 NOTE — Telephone Encounter (Signed)
Pt is aware and in agreement with plan/saw Cards today/She will initiate Vit-Derelle Cockrell Rx and call back to schedule lab draw for 3 months/created future order for Vit-Obediah Welles per PCP/She will await call from hematology/oncology/thx dmf

## 2020-02-06 NOTE — Progress Notes (Signed)
Remote ICD transmission.   

## 2020-02-08 NOTE — H&P (View-Only) (Signed)
Have her scheduled for me to cardiovert, thanks.

## 2020-02-08 NOTE — Progress Notes (Signed)
Have her scheduled for me to cardiovert, thanks.

## 2020-02-09 ENCOUNTER — Other Ambulatory Visit (HOSPITAL_COMMUNITY)
Admission: RE | Admit: 2020-02-09 | Discharge: 2020-02-09 | Disposition: A | Discharge status: Home / Self Care | Payer: Medicare Other | Source: Ambulatory Visit | Attending: Cardiology | Admitting: Cardiology

## 2020-02-09 ENCOUNTER — Other Ambulatory Visit (HOSPITAL_COMMUNITY): Payer: Self-pay | Admitting: *Deleted

## 2020-02-09 DIAGNOSIS — Z01812 Encounter for preprocedural laboratory examination: Secondary | ICD-10-CM | POA: Insufficient documentation

## 2020-02-09 DIAGNOSIS — Z20822 Contact with and (suspected) exposure to covid-19: Secondary | ICD-10-CM | POA: Diagnosis not present

## 2020-02-09 LAB — SARS CORONAVIRUS 2 (TAT 6-24 HRS): SARS Coronavirus 2: NEGATIVE

## 2020-02-12 ENCOUNTER — Ambulatory Visit (HOSPITAL_COMMUNITY): Payer: Medicare Other | Admitting: Anesthesiology

## 2020-02-12 ENCOUNTER — Other Ambulatory Visit: Payer: Self-pay

## 2020-02-12 ENCOUNTER — Encounter (HOSPITAL_COMMUNITY)
Admission: RE | Disposition: A | Discharge status: Home / Self Care | Payer: Self-pay | Source: Ambulatory Visit | Attending: Cardiology

## 2020-02-12 ENCOUNTER — Encounter (HOSPITAL_COMMUNITY): Payer: Self-pay | Admitting: Cardiology

## 2020-02-12 ENCOUNTER — Ambulatory Visit (HOSPITAL_COMMUNITY)
Admission: RE | Admit: 2020-02-12 | Discharge: 2020-02-12 | Disposition: A | Discharge status: Home / Self Care | Payer: Medicare Other | Source: Ambulatory Visit | Attending: Cardiology | Admitting: Cardiology

## 2020-02-12 DIAGNOSIS — E669 Obesity, unspecified: Secondary | ICD-10-CM | POA: Insufficient documentation

## 2020-02-12 DIAGNOSIS — I5032 Chronic diastolic (congestive) heart failure: Secondary | ICD-10-CM | POA: Diagnosis not present

## 2020-02-12 DIAGNOSIS — E1122 Type 2 diabetes mellitus with diabetic chronic kidney disease: Secondary | ICD-10-CM | POA: Diagnosis not present

## 2020-02-12 DIAGNOSIS — Z79899 Other long term (current) drug therapy: Secondary | ICD-10-CM | POA: Diagnosis not present

## 2020-02-12 DIAGNOSIS — I4892 Unspecified atrial flutter: Secondary | ICD-10-CM | POA: Insufficient documentation

## 2020-02-12 DIAGNOSIS — I4819 Other persistent atrial fibrillation: Secondary | ICD-10-CM | POA: Diagnosis not present

## 2020-02-12 DIAGNOSIS — I447 Left bundle-branch block, unspecified: Secondary | ICD-10-CM | POA: Diagnosis not present

## 2020-02-12 DIAGNOSIS — F329 Major depressive disorder, single episode, unspecified: Secondary | ICD-10-CM | POA: Diagnosis not present

## 2020-02-12 DIAGNOSIS — D631 Anemia in chronic kidney disease: Secondary | ICD-10-CM | POA: Diagnosis not present

## 2020-02-12 DIAGNOSIS — I130 Hypertensive heart and chronic kidney disease with heart failure and stage 1 through stage 4 chronic kidney disease, or unspecified chronic kidney disease: Secondary | ICD-10-CM | POA: Diagnosis not present

## 2020-02-12 DIAGNOSIS — D6869 Other thrombophilia: Secondary | ICD-10-CM | POA: Diagnosis not present

## 2020-02-12 DIAGNOSIS — N183 Chronic kidney disease, stage 3 unspecified: Secondary | ICD-10-CM | POA: Diagnosis not present

## 2020-02-12 DIAGNOSIS — I484 Atypical atrial flutter: Secondary | ICD-10-CM | POA: Diagnosis not present

## 2020-02-12 DIAGNOSIS — Z7901 Long term (current) use of anticoagulants: Secondary | ICD-10-CM | POA: Insufficient documentation

## 2020-02-12 DIAGNOSIS — N184 Chronic kidney disease, stage 4 (severe): Secondary | ICD-10-CM | POA: Diagnosis not present

## 2020-02-12 DIAGNOSIS — I5022 Chronic systolic (congestive) heart failure: Secondary | ICD-10-CM | POA: Diagnosis not present

## 2020-02-12 DIAGNOSIS — Z95810 Presence of automatic (implantable) cardiac defibrillator: Secondary | ICD-10-CM | POA: Insufficient documentation

## 2020-02-12 DIAGNOSIS — Z6828 Body mass index (BMI) 28.0-28.9, adult: Secondary | ICD-10-CM | POA: Diagnosis not present

## 2020-02-12 DIAGNOSIS — M109 Gout, unspecified: Secondary | ICD-10-CM | POA: Diagnosis not present

## 2020-02-12 DIAGNOSIS — Z79890 Hormone replacement therapy (postmenopausal): Secondary | ICD-10-CM | POA: Diagnosis not present

## 2020-02-12 DIAGNOSIS — I428 Other cardiomyopathies: Secondary | ICD-10-CM | POA: Diagnosis not present

## 2020-02-12 DIAGNOSIS — G4733 Obstructive sleep apnea (adult) (pediatric): Secondary | ICD-10-CM | POA: Diagnosis not present

## 2020-02-12 DIAGNOSIS — E039 Hypothyroidism, unspecified: Secondary | ICD-10-CM | POA: Diagnosis not present

## 2020-02-12 HISTORY — PX: CARDIOVERSION: SHX1299

## 2020-02-12 LAB — POCT I-STAT, CHEM 8
BUN: 75 mg/dL — ABNORMAL HIGH (ref 8–23)
Calcium, Ion: 1.22 mmol/L (ref 1.15–1.40)
Chloride: 106 mmol/L (ref 98–111)
Creatinine, Ser: 2.8 mg/dL — ABNORMAL HIGH (ref 0.44–1.00)
Glucose, Bld: 107 mg/dL — ABNORMAL HIGH (ref 70–99)
HCT: 30 % — ABNORMAL LOW (ref 36.0–46.0)
Hemoglobin: 10.2 g/dL — ABNORMAL LOW (ref 12.0–15.0)
Potassium: 4.6 mmol/L (ref 3.5–5.1)
Sodium: 137 mmol/L (ref 135–145)
TCO2: 20 mmol/L — ABNORMAL LOW (ref 22–32)

## 2020-02-12 SURGERY — CARDIOVERSION
Anesthesia: General

## 2020-02-12 MED ORDER — TORSEMIDE 20 MG PO TABS: 30.0000 mg | ORAL_TABLET | Freq: Every day | ORAL | 3 refills | Status: DC

## 2020-02-12 MED ORDER — LIDOCAINE 2% (20 MG/ML) 5 ML SYRINGE
INTRAMUSCULAR | Status: DC | PRN
  Administered 2020-02-12: 100 mg via INTRAVENOUS

## 2020-02-12 MED ORDER — PROPOFOL 10 MG/ML IV BOLUS
INTRAVENOUS | Status: DC | PRN
  Administered 2020-02-12: 50 mg via INTRAVENOUS

## 2020-02-12 NOTE — Anesthesia Preprocedure Evaluation (Addendum)
Anesthesia Evaluation  Patient identified by MRN, date of birth, ID band Patient awake    Reviewed: Allergy & Precautions, NPO status , Patient's Chart, lab work & pertinent test results, reviewed documented beta blocker date and time   Airway Mallampati: II  TM Distance: >3 FB Neck ROM: Full    Dental  (+) Lower Dentures, Partial Upper, Caps   Pulmonary asthma , sleep apnea and Continuous Positive Airway Pressure Ventilation , COPD,  COPD inhaler,    Pulmonary exam normal breath sounds clear to auscultation (-) decreased breath sounds      Cardiovascular hypertension, Pt. on medications and Pt. on home beta blockers +CHF  Normal cardiovascular exam+ dysrhythmias Atrial Fibrillation + Cardiac Defibrillator  Rhythm:Regular Rate:Normal  Non ischemic CM LVEF  ECHO 8/21 Left Ventricle: Left ventricular ejection fraction, by estimation, is 50  to 55%. The left ventricle has low normal function. The left ventricle has  no regional wall motion abnormalities. The left ventricular internal  cavity size was normal in size.  There is moderate left ventricular hypertrophy. Left ventricular diastolic  parameters are consistent with Grade II diastolic dysfunction    Neuro/Psych PSYCHIATRIC DISORDERS Depression negative neurological ROS     GI/Hepatic negative GI ROS, (+) Hepatitis -, C  Endo/Other  diabetes, Well Controlled, Type 2Hypothyroidism Obesity  Renal/GU Renal InsufficiencyRenal disease  negative genitourinary   Musculoskeletal negative musculoskeletal ROS (+) Arthritis , Osteoarthritis,    Abdominal (+) + obese,   Peds  Hematology  (+) Blood dyscrasia, anemia , Eliquis- last dose   Anesthesia Other Findings   Reproductive/Obstetrics                            Anesthesia Physical  Anesthesia Plan  ASA: III  Anesthesia Plan: General   Post-op Pain Management:    Induction:  Intravenous  PONV Risk Score and Plan: 3 and Treatment may vary due to age or medical condition, Propofol infusion and Ondansetron  Airway Management Planned: Natural Airway, Nasal Cannula, Simple Face Mask and Mask  Additional Equipment:   Intra-op Plan:   Post-operative Plan:   Informed Consent: I have reviewed the patients History and Physical, chart, labs and discussed the procedure including the risks, benefits and alternatives for the proposed anesthesia with the patient or authorized representative who has indicated his/her understanding and acceptance.       Plan Discussed with: Anesthesiologist, CRNA and Surgeon  Anesthesia Plan Comments:         Anesthesia Quick Evaluation

## 2020-02-12 NOTE — Interval H&P Note (Signed)
History and Physical Interval Note:  02/12/2020 12:05 PM  Morgan Clay  has presented today for surgery, with the diagnosis of AFIB.  The various methods of treatment have been discussed with the patient and family. After consideration of risks, benefits and other options for treatment, the patient has consented to  Procedure(s): CARDIOVERSION (N/A) as a surgical intervention.  The patient's history has been reviewed, patient examined, no change in status, stable for surgery.  I have reviewed the patient's chart and labs.  Questions were answered to the patient's satisfaction.     Alexandria Shiflett [employer]

## 2020-02-12 NOTE — Procedures (Signed)
Electrical Cardioversion Procedure Note Saretta Dahlem [id number] [birth year]  Procedure: Electrical Cardioversion Indications:  Atrial Flutter  Procedure Details Consent: Risks of procedure as well as the alternatives and risks of each were explained to the (patient/caregiver).  Consent for procedure obtained. Time Out: Verified patient identification, verified procedure, site/side was marked, verified correct patient position, special equipment/implants available, medications/allergies/relevent history reviewed, required imaging and test results available.  Performed  Patient placed on cardiac monitor, pulse oximetry, supplemental oxygen as necessary.  Sedation given: Propofol Pacer pads placed anterior and posterior chest.  Cardioverted 1 time(s).  Cardioverted at 150J.  Evaluation Findings: Post procedure EKG shows: NSR Complications: None Patient did tolerate procedure well.   Loralie Champagne 02/12/2020, 12:08 PM

## 2020-02-12 NOTE — Anesthesia Postprocedure Evaluation (Signed)
Anesthesia Post Note  Patient: Morgan Clay  Procedure(s) Performed: CARDIOVERSION (N/A )     Patient location during evaluation: PACU Anesthesia Type: General Level of consciousness: awake and alert Pain management: pain level controlled Vital Signs Assessment: post-procedure vital signs reviewed and stable Respiratory status: spontaneous breathing, nonlabored ventilation, respiratory function stable and patient connected to nasal cannula oxygen Cardiovascular status: blood pressure returned to baseline and stable Postop Assessment: no apparent nausea or vomiting Anesthetic complications: no   No complications documented.  Last Vitals:  Vitals:   02/12/20 1212 02/12/20 1224  BP: (!) 114/53 (!) 136/48  Pulse: 78 (!) 59  Resp: 20 16  Temp: 36.5 C   SpO2: 100% 100%    Last Pain:  Vitals:   02/12/20 1224  TempSrc:   PainSc: 0-No pain                 Kyiesha Millward

## 2020-02-12 NOTE — Discharge Instructions (Signed)
Decrease torsemide to 30 mg daily (1 and 1/2 tabs).    Electrical Cardioversion Electrical cardioversion is the delivery of a jolt of electricity to restore a normal rhythm to the heart. A rhythm that is too fast or is not regular keeps the heart from pumping well. In this procedure, sticky patches or metal paddles are placed on the chest to deliver electricity to the heart from a device. This procedure may be done in an emergency if:  There is low or no blood pressure as a result of the heart rhythm.  Normal rhythm must be restored as fast as possible to protect the brain and heart from further damage.  It may save a life. This may also be a scheduled procedure for irregular or fast heart rhythms that are not immediately life-threatening. Tell a health care provider about:  Any allergies you have.  All medicines you are taking, including vitamins, herbs, eye drops, creams, and over-the-counter medicines.  Any problems you or family members have had with anesthetic medicines.  Any blood disorders you have.  Any surgeries you have had.  Any medical conditions you have.  Whether you are pregnant or may be pregnant. What are the risks? Generally, this is a safe procedure. However, problems may occur, including:  Allergic reactions to medicines.  A blood clot that breaks free and travels to other parts of your body.  The possible return of an abnormal heart rhythm within hours or days after the procedure.  Your heart stopping (cardiac arrest). This is rare. What happens before the procedure? Medicines  Your health care provider may have you start taking: ? Blood-thinning medicines (anticoagulants) so your blood does not clot as easily. ? Medicines to help stabilize your heart rate and rhythm.  Ask your health care provider about: ? Changing or stopping your regular medicines. This is especially important if you are taking diabetes medicines or blood thinners. ? Taking  medicines such as aspirin and ibuprofen. These medicines can thin your blood. Do not take these medicines unless your health care provider tells you to take them. ? Taking over-the-counter medicines, vitamins, herbs, and supplements. General instructions  Follow instructions from your health care provider about eating or drinking restrictions.  Plan to have someone take you home from the hospital or clinic.  If you will be going home right after the procedure, plan to have someone with you for 24 hours.  Ask your health care provider what steps will be taken to help prevent infection. These may include washing your skin with a germ-killing soap. What happens during the procedure?   An IV will be inserted into one of your veins.  Sticky patches (electrodes) or metal paddles may be placed on your chest.  You will be given a medicine to help you relax (sedative).  An electrical shock will be delivered. The procedure may vary among health care providers and hospitals. What can I expect after the procedure?  Your blood pressure, heart rate, breathing rate, and blood oxygen level will be monitored until you leave the hospital or clinic.  Your heart rhythm will be watched to make sure it does not change.  You may have some redness on the skin where the shocks were given. Follow these instructions at home:  Do not drive for 24 hours if you were given a sedative during your procedure.  Take over-the-counter and prescription medicines only as told by your health care provider.  Ask your health care provider how to check your  pulse. Check it often.  Rest for 48 hours after the procedure or as told by your health care provider.  Avoid or limit your caffeine use as told by your health care provider.  Keep all follow-up visits as told by your health care provider. This is important. Contact a health care provider if:  You feel like your heart is beating too quickly or your pulse is not  regular.  You have a serious muscle cramp that does not go away. Get help right away if:  You have discomfort in your chest.  You are dizzy or you feel faint.  You have trouble breathing or you are short of breath.  Your speech is slurred.  You have trouble moving an arm or leg on one side of your body.  Your fingers or toes turn cold or blue. Summary  Electrical cardioversion is the delivery of a jolt of electricity to restore a normal rhythm to the heart.  This procedure may be done right away in an emergency or may be a scheduled procedure if the condition is not an emergency.  Generally, this is a safe procedure.  After the procedure, check your pulse often as told by your health care provider. This information is not intended to replace advice given to you by your health care provider. Make sure you discuss any questions you have with your health care provider. Document Revised: 11/25/2018 Document Reviewed: 11/25/2018 Elsevier Patient Education  [place].

## 2020-02-12 NOTE — Transfer of Care (Signed)
Immediate Anesthesia Transfer of Care Note  Patient: Morgan Clay  Procedure(s) Performed: CARDIOVERSION (N/A )  Patient Location: Endoscopy Unit  Anesthesia Type:General  Level of Consciousness: drowsy and patient cooperative  Airway & Oxygen Therapy: Patient Spontanous Breathing  Post-op Assessment: Report given to RN and Post -op Vital signs reviewed and stable  Post vital signs: Reviewed and stable  Last Vitals:  Vitals Value Taken Time  BP 110/53 02/12/20 1209  Temp    Pulse 80 02/12/20 1210  Resp 22 02/12/20 1210  SpO2 100 % 02/12/20 1210    Last Pain:  Vitals:   02/12/20 1118  TempSrc: Oral  PainSc: 0-No pain         Complications: No complications documented.

## 2020-02-15 ENCOUNTER — Encounter (HOSPITAL_COMMUNITY): Payer: Self-pay | Admitting: Cardiology

## 2020-03-01 ENCOUNTER — Telehealth (HOSPITAL_COMMUNITY): Payer: Self-pay

## 2020-03-01 ENCOUNTER — Inpatient Hospital Stay (HOSPITAL_COMMUNITY)
Admission: EM | Admit: 2020-03-01 | Discharge: 2020-03-04 | DRG: 378 | Disposition: A | Discharge status: Home / Self Care | Payer: Medicare Other | Attending: Internal Medicine | Admitting: Internal Medicine

## 2020-03-01 ENCOUNTER — Ambulatory Visit (INDEPENDENT_AMBULATORY_CARE_PROVIDER_SITE_OTHER): Payer: Medicare Other

## 2020-03-01 ENCOUNTER — Ambulatory Visit (HOSPITAL_BASED_OUTPATIENT_CLINIC_OR_DEPARTMENT_OTHER)
Admission: RE | Admit: 2020-03-01 | Discharge: 2020-03-01 | Disposition: A | Discharge status: Home / Self Care | Payer: Medicare Other | Source: Ambulatory Visit | Attending: Cardiology | Admitting: Cardiology

## 2020-03-01 ENCOUNTER — Encounter (HOSPITAL_COMMUNITY): Payer: Self-pay

## 2020-03-01 ENCOUNTER — Emergency Department (HOSPITAL_COMMUNITY): Payer: Medicare Other

## 2020-03-01 ENCOUNTER — Other Ambulatory Visit: Payer: Self-pay

## 2020-03-01 VITALS — BP 115/70 | HR 80 | Wt 145.0 lb

## 2020-03-01 DIAGNOSIS — I8510 Secondary esophageal varices without bleeding: Secondary | ICD-10-CM | POA: Diagnosis not present

## 2020-03-01 DIAGNOSIS — K7460 Unspecified cirrhosis of liver: Secondary | ICD-10-CM | POA: Diagnosis present

## 2020-03-01 DIAGNOSIS — Z79899 Other long term (current) drug therapy: Secondary | ICD-10-CM

## 2020-03-01 DIAGNOSIS — N184 Chronic kidney disease, stage 4 (severe): Secondary | ICD-10-CM | POA: Diagnosis not present

## 2020-03-01 DIAGNOSIS — J449 Chronic obstructive pulmonary disease, unspecified: Secondary | ICD-10-CM | POA: Diagnosis present

## 2020-03-01 DIAGNOSIS — Z95810 Presence of automatic (implantable) cardiac defibrillator: Secondary | ICD-10-CM | POA: Diagnosis not present

## 2020-03-01 DIAGNOSIS — I2510 Atherosclerotic heart disease of native coronary artery without angina pectoris: Secondary | ICD-10-CM | POA: Insufficient documentation

## 2020-03-01 DIAGNOSIS — Z79890 Hormone replacement therapy (postmenopausal): Secondary | ICD-10-CM

## 2020-03-01 DIAGNOSIS — I428 Other cardiomyopathies: Secondary | ICD-10-CM

## 2020-03-01 DIAGNOSIS — D62 Acute posthemorrhagic anemia: Secondary | ICD-10-CM | POA: Diagnosis present

## 2020-03-01 DIAGNOSIS — K31819 Angiodysplasia of stomach and duodenum without bleeding: Secondary | ICD-10-CM | POA: Diagnosis not present

## 2020-03-01 DIAGNOSIS — Z7901 Long term (current) use of anticoagulants: Secondary | ICD-10-CM

## 2020-03-01 DIAGNOSIS — I130 Hypertensive heart and chronic kidney disease with heart failure and stage 1 through stage 4 chronic kidney disease, or unspecified chronic kidney disease: Secondary | ICD-10-CM | POA: Insufficient documentation

## 2020-03-01 DIAGNOSIS — Z9049 Acquired absence of other specified parts of digestive tract: Secondary | ICD-10-CM | POA: Insufficient documentation

## 2020-03-01 DIAGNOSIS — K5520 Angiodysplasia of colon without hemorrhage: Secondary | ICD-10-CM | POA: Diagnosis present

## 2020-03-01 DIAGNOSIS — I10 Essential (primary) hypertension: Secondary | ICD-10-CM | POA: Diagnosis not present

## 2020-03-01 DIAGNOSIS — I4892 Unspecified atrial flutter: Secondary | ICD-10-CM | POA: Insufficient documentation

## 2020-03-01 DIAGNOSIS — J90 Pleural effusion, not elsewhere classified: Secondary | ICD-10-CM | POA: Diagnosis not present

## 2020-03-01 DIAGNOSIS — G4700 Insomnia, unspecified: Secondary | ICD-10-CM | POA: Diagnosis present

## 2020-03-01 DIAGNOSIS — Z8249 Family history of ischemic heart disease and other diseases of the circulatory system: Secondary | ICD-10-CM

## 2020-03-01 DIAGNOSIS — I482 Chronic atrial fibrillation, unspecified: Secondary | ICD-10-CM | POA: Diagnosis present

## 2020-03-01 DIAGNOSIS — R531 Weakness: Secondary | ICD-10-CM | POA: Diagnosis not present

## 2020-03-01 DIAGNOSIS — Z8619 Personal history of other infectious and parasitic diseases: Secondary | ICD-10-CM

## 2020-03-01 DIAGNOSIS — Z8719 Personal history of other diseases of the digestive system: Secondary | ICD-10-CM | POA: Insufficient documentation

## 2020-03-01 DIAGNOSIS — F32A Depression, unspecified: Secondary | ICD-10-CM | POA: Diagnosis present

## 2020-03-01 DIAGNOSIS — M1990 Unspecified osteoarthritis, unspecified site: Secondary | ICD-10-CM | POA: Diagnosis not present

## 2020-03-01 DIAGNOSIS — E1122 Type 2 diabetes mellitus with diabetic chronic kidney disease: Secondary | ICD-10-CM | POA: Diagnosis present

## 2020-03-01 DIAGNOSIS — M8580 Other specified disorders of bone density and structure, unspecified site: Secondary | ICD-10-CM | POA: Diagnosis present

## 2020-03-01 DIAGNOSIS — I517 Cardiomegaly: Secondary | ICD-10-CM | POA: Diagnosis not present

## 2020-03-01 DIAGNOSIS — H409 Unspecified glaucoma: Secondary | ICD-10-CM | POA: Diagnosis not present

## 2020-03-01 DIAGNOSIS — K317 Polyp of stomach and duodenum: Secondary | ICD-10-CM | POA: Diagnosis present

## 2020-03-01 DIAGNOSIS — I484 Atypical atrial flutter: Secondary | ICD-10-CM

## 2020-03-01 DIAGNOSIS — G4733 Obstructive sleep apnea (adult) (pediatric): Secondary | ICD-10-CM | POA: Diagnosis present

## 2020-03-01 DIAGNOSIS — I5032 Chronic diastolic (congestive) heart failure: Secondary | ICD-10-CM | POA: Diagnosis not present

## 2020-03-01 DIAGNOSIS — K31811 Angiodysplasia of stomach and duodenum with bleeding: Secondary | ICD-10-CM | POA: Diagnosis not present

## 2020-03-01 DIAGNOSIS — E785 Hyperlipidemia, unspecified: Secondary | ICD-10-CM | POA: Diagnosis not present

## 2020-03-01 DIAGNOSIS — I8500 Esophageal varices without bleeding: Secondary | ICD-10-CM | POA: Diagnosis not present

## 2020-03-01 DIAGNOSIS — I5042 Chronic combined systolic (congestive) and diastolic (congestive) heart failure: Secondary | ICD-10-CM | POA: Diagnosis not present

## 2020-03-01 DIAGNOSIS — Z20822 Contact with and (suspected) exposure to covid-19: Secondary | ICD-10-CM | POA: Diagnosis present

## 2020-03-01 DIAGNOSIS — I480 Paroxysmal atrial fibrillation: Secondary | ICD-10-CM | POA: Diagnosis present

## 2020-03-01 DIAGNOSIS — K922 Gastrointestinal hemorrhage, unspecified: Secondary | ICD-10-CM | POA: Diagnosis not present

## 2020-03-01 DIAGNOSIS — E861 Hypovolemia: Secondary | ICD-10-CM | POA: Diagnosis present

## 2020-03-01 DIAGNOSIS — Z825 Family history of asthma and other chronic lower respiratory diseases: Secondary | ICD-10-CM

## 2020-03-01 DIAGNOSIS — K254 Chronic or unspecified gastric ulcer with hemorrhage: Secondary | ICD-10-CM | POA: Insufficient documentation

## 2020-03-01 DIAGNOSIS — M109 Gout, unspecified: Secondary | ICD-10-CM | POA: Diagnosis present

## 2020-03-01 DIAGNOSIS — E663 Overweight: Secondary | ICD-10-CM | POA: Diagnosis present

## 2020-03-01 DIAGNOSIS — N183 Chronic kidney disease, stage 3 unspecified: Secondary | ICD-10-CM | POA: Insufficient documentation

## 2020-03-01 DIAGNOSIS — I447 Left bundle-branch block, unspecified: Secondary | ICD-10-CM | POA: Diagnosis present

## 2020-03-01 DIAGNOSIS — R0602 Shortness of breath: Secondary | ICD-10-CM | POA: Diagnosis not present

## 2020-03-01 DIAGNOSIS — E039 Hypothyroidism, unspecified: Secondary | ICD-10-CM | POA: Diagnosis not present

## 2020-03-01 DIAGNOSIS — D509 Iron deficiency anemia, unspecified: Secondary | ICD-10-CM | POA: Diagnosis present

## 2020-03-01 DIAGNOSIS — D649 Anemia, unspecified: Secondary | ICD-10-CM | POA: Diagnosis present

## 2020-03-01 LAB — COMPREHENSIVE METABOLIC PANEL
ALT: 13 U/L (ref 0–44)
AST: 23 U/L (ref 15–41)
Albumin: 4.1 g/dL (ref 3.5–5.0)
Alkaline Phosphatase: 85 U/L (ref 38–126)
Anion gap: 15 (ref 5–15)
BUN: 53 mg/dL — ABNORMAL HIGH (ref 8–23)
CO2: 17 mmol/L — ABNORMAL LOW (ref 22–32)
Calcium: 9.6 mg/dL (ref 8.9–10.3)
Chloride: 105 mmol/L (ref 98–111)
Creatinine, Ser: 2.23 mg/dL — ABNORMAL HIGH (ref 0.44–1.00)
GFR, Estimated: 23 mL/min — ABNORMAL LOW (ref >60)
Glucose, Bld: 138 mg/dL — ABNORMAL HIGH (ref 70–99)
Potassium: 4 mmol/L (ref 3.5–5.1)
Sodium: 137 mmol/L (ref 135–145)
Total Bilirubin: 0.6 mg/dL (ref 0.3–1.2)
Total Protein: 8.1 g/dL (ref 6.5–8.1)

## 2020-03-01 LAB — POC OCCULT BLOOD, ED: Fecal Occult Bld: POSITIVE — AB

## 2020-03-01 LAB — CBC
HCT: 22.1 % — ABNORMAL LOW (ref 36.0–46.0)
HCT: 23.3 % — ABNORMAL LOW (ref 36.0–46.0)
Hemoglobin: 6.9 g/dL — CL (ref 12.0–15.0)
Hemoglobin: 7.2 g/dL — ABNORMAL LOW (ref 12.0–15.0)
MCH: 27.7 pg (ref 26.0–34.0)
MCH: 27.4 pg (ref 26.0–34.0)
MCHC: 31.2 g/dL (ref 30.0–36.0)
MCHC: 30.9 g/dL (ref 30.0–36.0)
MCV: 88.8 fL (ref 80.0–100.0)
MCV: 88.6 fL (ref 80.0–100.0)
Platelets: 321 10*3/uL (ref 150–400)
Platelets: 316 10*3/uL (ref 150–400)
RBC: 2.49 MIL/uL — ABNORMAL LOW (ref 3.87–5.11)
RBC: 2.63 MIL/uL — ABNORMAL LOW (ref 3.87–5.11)
RDW: 22.9 % — ABNORMAL HIGH (ref 11.5–15.5)
RDW: 23.2 % — ABNORMAL HIGH (ref 11.5–15.5)
WBC: 7.1 10*3/uL (ref 4.0–10.5)
WBC: 7.6 10*3/uL (ref 4.0–10.5)
nRBC: 0.3 % — ABNORMAL HIGH (ref 0.0–0.2)
nRBC: 0.3 % — ABNORMAL HIGH (ref 0.0–0.2)

## 2020-03-01 LAB — BASIC METABOLIC PANEL
Anion gap: 10 (ref 5–15)
BUN: 54 mg/dL — ABNORMAL HIGH (ref 8–23)
CO2: 20 mmol/L — ABNORMAL LOW (ref 22–32)
Calcium: 9.5 mg/dL (ref 8.9–10.3)
Chloride: 107 mmol/L (ref 98–111)
Creatinine, Ser: 2.16 mg/dL — ABNORMAL HIGH (ref 0.44–1.00)
GFR, Estimated: 24 mL/min — ABNORMAL LOW (ref >60)
Glucose, Bld: 116 mg/dL — ABNORMAL HIGH (ref 70–99)
Potassium: 4.1 mmol/L (ref 3.5–5.1)
Sodium: 137 mmol/L (ref 135–145)

## 2020-03-01 LAB — RESP PANEL BY RT PCR (RSV, FLU A&B, COVID)
Influenza A by PCR: NEGATIVE
Influenza B by PCR: NEGATIVE
Respiratory Syncytial Virus by PCR: NEGATIVE
SARS Coronavirus 2 by RT PCR: NEGATIVE

## 2020-03-01 LAB — PREPARE RBC (CROSSMATCH)

## 2020-03-01 MED ORDER — LABETALOL HCL 5 MG/ML IV SOLN: 10.0000 mg | INTRAVENOUS | Status: DC | PRN

## 2020-03-01 MED ORDER — LEVOTHYROXINE SODIUM 25 MCG PO TABS
137.0000 ug | ORAL_TABLET | Freq: Every day | ORAL | Status: DC
  Administered 2020-03-02 – 2020-03-04 (×3): 137 ug via ORAL
  Filled 2020-03-01 (×3): qty 1

## 2020-03-01 MED ORDER — ALLOPURINOL 300 MG PO TABS
300.0000 mg | ORAL_TABLET | Freq: Every day | ORAL | Status: DC
  Administered 2020-03-01 – 2020-03-04 (×4): 300 mg via ORAL
  Filled 2020-03-01 (×3): qty 1
  Filled 2020-03-01: qty 3

## 2020-03-01 MED ORDER — COLCHICINE 0.6 MG PO TABS: 0.6000 mg | ORAL_TABLET | Freq: Every day | ORAL | Status: DC | PRN

## 2020-03-01 MED ORDER — SODIUM CHLORIDE 0.9% IV SOLUTION: Freq: Once | INTRAVENOUS | Status: AC

## 2020-03-01 MED ORDER — ONDANSETRON HCL 4 MG/2ML IJ SOLN: 4.0000 mg | Freq: Four times a day (QID) | INTRAMUSCULAR | Status: DC | PRN

## 2020-03-01 MED ORDER — ACETAMINOPHEN 650 MG RE SUPP: 650.0000 mg | Freq: Four times a day (QID) | RECTAL | Status: DC | PRN

## 2020-03-01 MED ORDER — AMIODARONE HCL 200 MG PO TABS
200.0000 mg | ORAL_TABLET | Freq: Two times a day (BID) | ORAL | Status: DC
  Administered 2020-03-01 – 2020-03-04 (×6): 200 mg via ORAL
  Filled 2020-03-01 (×6): qty 1

## 2020-03-01 MED ORDER — SODIUM CHLORIDE 0.9 % IV SOLN
8.0000 mg/h | INTRAVENOUS | Status: DC
  Administered 2020-03-01: 8 mg/h via INTRAVENOUS
  Filled 2020-03-01 (×2): qty 80

## 2020-03-01 MED ORDER — ONDANSETRON HCL 4 MG PO TABS: 4.0000 mg | ORAL_TABLET | Freq: Four times a day (QID) | ORAL | Status: DC | PRN

## 2020-03-01 MED ORDER — TRAZODONE HCL 50 MG PO TABS
50.0000 mg | ORAL_TABLET | Freq: Every day | ORAL | Status: DC
  Administered 2020-03-01 – 2020-03-03 (×2): 50 mg via ORAL
  Filled 2020-03-01 (×2): qty 1

## 2020-03-01 MED ORDER — ACETAMINOPHEN 325 MG PO TABS
650.0000 mg | ORAL_TABLET | Freq: Four times a day (QID) | ORAL | Status: DC | PRN
  Administered 2020-03-02: 650 mg via ORAL
  Filled 2020-03-01: qty 2

## 2020-03-01 MED ORDER — SERTRALINE HCL 50 MG PO TABS
50.0000 mg | ORAL_TABLET | Freq: Every day | ORAL | Status: DC
  Administered 2020-03-02 – 2020-03-04 (×3): 50 mg via ORAL
  Filled 2020-03-01 (×3): qty 1

## 2020-03-01 MED ORDER — SODIUM CHLORIDE 0.9 % IV SOLN
80.0000 mg | Freq: Once | INTRAVENOUS | Status: AC
  Administered 2020-03-01: 80 mg via INTRAVENOUS
  Filled 2020-03-01 (×2): qty 80

## 2020-03-01 MED ORDER — AMIODARONE HCL 200 MG PO TABS: 200.0000 mg | ORAL_TABLET | Freq: Two times a day (BID) | ORAL | 3 refills | Status: DC

## 2020-03-01 MED ORDER — TORSEMIDE 20 MG PO TABS: 40.0000 mg | ORAL_TABLET | Freq: Every day | ORAL | 3 refills | Status: DC

## 2020-03-01 MED ORDER — GABAPENTIN 300 MG PO CAPS
300.0000 mg | ORAL_CAPSULE | Freq: Two times a day (BID) | ORAL | Status: DC
  Administered 2020-03-01 – 2020-03-04 (×6): 300 mg via ORAL
  Filled 2020-03-01 (×6): qty 1

## 2020-03-01 MED ORDER — METOPROLOL TARTRATE 5 MG/5ML IV SOLN: 2.5000 mg | INTRAVENOUS | Status: DC | PRN

## 2020-03-01 NOTE — Telephone Encounter (Signed)
Lab called with critical lab. Hemoglobin 6.9. Per Ann Maki, patient advised to go to the ED. Patient verbalized understanding but stated that she was going to go home and "put some things together" and then go to Urgent care instead of the ED. Advised patient the importance of going straight to the ED

## 2020-03-01 NOTE — H&P (Signed)
History and Physical    Morgan Clay RWE:[id number] DOB: 1945-06-15 DOA: 03/01/2020  PCP: Billie Ruddy, MD (Confirm with patient/family/NH records and if not entered, this has to be entered at [facility] point of entry) Patient coming from: Home  I have personally briefly reviewed patient's old medical records in [place]  Chief Complaint: Feeling weak, SOB.  HPI: Morgan Clay is a 74 y.o. female with medical history significant of A. fib on Eliquis, chronic systolic CHF, AICD, CKD stage III, hypothyroidism, anxiety depression, gout presented with increasing shortness breath and generalized weakness.  Patient has chronic CHF but for the past 2 to 3 weeks she started to feel increasing shortness breath, she does notice her stool color turned darker and darker for 1 week or 2 and she currently has sharp-like epigastric pain for an hour or two, as frequently as 1-2 times a week, no nauseous vomiting no diarrhea and she denied any bright blood in the stool.  Denied any other bleedings.   Patient has chronic A. fib which became uncontrolled recently, and patient underwent cardioversion on 10/07, and sent home.  Today patient went back to cardiologist for recheck, hemoglobin 6.9 compared to 10.2 about 3 weeks ago and patient sent to the ED.  Patient denied any chest pain but does complain about worsening of exercise tolerance and generalized weakness. ED Course: Hb 6.9 compared to 10.2 two weeks ago.  Review of Systems: As per HPI otherwise 14 point review of systems negative.    Past Medical History:  Diagnosis Date  . A-fib ([place])    on Eliquis  . AICD (automatic cardioverter/defibrillator) present 10/06/2015  . Anemia   . ARF (acute renal failure) ([place])   . Arthritis    "qwhere" (01/03/2018)  . Asthma    reports mild asthma since childhood - had COPD on dx list from prior PCP  . Chronic bronchitis ([place])    "get it most years; not this past year though" (01/03/2018)  .  Chronic kidney disease    "? stage;  followed by [facility]; [name]" (01/03/2018)  . Chronic systolic congestive heart failure ([place])   . DEPRESSION 12/17/2009   Annotation: PHQ-9 score = 14 done on 12/17/2009 Qualifier: Diagnosis of  By: Hassell Done FNP, Tori Milks    . Diabetes mellitus without complication (HCC)    DIET CONTROLLED   . Diverticulosis   . Enteritis   . FIBROIDS, UTERUS 03/05/2008  . GI bleeding 01/03/2018  . Glaucoma   . Gout   . Hepatitis C    HEPATITIS C - s/p treatment with Harvoni, saw hepatology, Dawn Drazek  . History of blood transfusion ~ 11/2017  . Hyperlipemia 12/06/2012  . HYPERTENSION, BENIGN 04/24/2007  . Hypothyroidism   . LBBB (left bundle branch block)   . Lower GI bleeding    "been dealing w/it since 07/2017" (01/03/2018)  . Nonischemic cardiomyopathy ([place])   . OBESITY 05/27/2009  . OBSTRUCTIVE SLEEP APNEA 11/14/2007   no CPAP  . OSTEOPENIA 09/30/2008  . Pain and swelling of left upper extremity 08/08/2017  . Personal history of other infectious and parasitic disease    Hepatitis B  . Pneumonia    "several times" (01/03/2018)  . Pulmonary edema, acute ([place]) 08/09/2017    Past Surgical History:  Procedure Laterality Date  . APPENDECTOMY    . CARDIAC CATHETERIZATION    . CARDIOVERSION N/A 12/14/2014   Procedure: CARDIOVERSION;  Surgeon: Dorothy Spark, MD;  Location: [place];  Service: Cardiovascular;  Laterality: N/A;  . CARDIOVERSION N/A 02/26/2017   Procedure: CARDIOVERSION;  Surgeon: Evans Lance, MD;  Location: [place] CV LAB;  Service: Cardiovascular;  Laterality: N/A;  . CARDIOVERSION N/A 07/24/2017   Procedure: CARDIOVERSION;  Surgeon: Thayer Headings, MD;  Location: [facility] ENDOSCOPY;  Service: Cardiovascular;  Laterality: N/A;  . CARDIOVERSION N/A 02/12/2020   Procedure: CARDIOVERSION;  Surgeon: [name], MD;  Location: [facility] ENDOSCOPY;  Service: Cardiovascular;  Laterality: N/A;  . COLONOSCOPY N/A 11/08/2017   Procedure:  COLONOSCOPY;  Surgeon: Milus Banister, MD;  Location: [facility] ENDOSCOPY;  Service: Endoscopy;  Laterality: N/A;  . DILATION AND CURETTAGE OF UTERUS    . EP IMPLANTABLE DEVICE N/A 10/06/2015   Procedure: BIV ICD Generator Changeout;  Surgeon: [name], MD;  Location: [place] CV LAB;  Service: Cardiovascular;  Laterality: N/A;  . ESOPHAGOGASTRODUODENOSCOPY N/A 10/26/2017   Procedure: ESOPHAGOGASTRODUODENOSCOPY (EGD);  Surgeon: Ladene Artist, MD;  Location: [facility] ENDOS[facility];  Service: Endoscopy;  Laterality: N/A;  . ESOPHAGOGASTRODUODENOSCOPY (EGD) WITH PROPOFOL N/A 11/07/2017   Procedure: ESOPHAGOGASTRODUODENOSCOPY (EGD) WITH PROPOFOL;  Surgeon: Milus Banister, MD;  Location: [facility] ENDOSCOPY;  Service: Endoscopy;  Laterality: N/A;  . HOT HEMOSTASIS N/A 10/26/2017   Procedure: HOT HEMOSTASIS (ARGON PLASMA COAGULATION/BICAP);  Surgeon: Ladene Artist, MD;  Location: [facility] ENDOSCOPY;  Service: Endoscopy;  Laterality: N/A;  . INSERT / REPLACE / REMOVE PACEMAKER  ?2008  . POLYPECTOMY  11/08/2017   Procedure: POLYPECTOMY;  Surgeon: Milus Banister, MD;  Location: [facility] ENDOSCOPY;  Service: Endoscopy;;  . PROCTOSCOPY N/A 05/22/2018   Procedure: RIGID PROCTOSCOPY;  Surgeon: Michael Boston, MD;  Location: [facility] ORS;  Service: General;  Laterality: N/A;  . RIGHT HEART CATH N/A 02/13/2018   Procedure: RIGHT HEART CATH;  Surgeon: [name], MD;  Location: [place] CV LAB;  Service: Cardiovascular;  Laterality: N/A;  . RIGHT/LEFT HEART CATH AND CORONARY ANGIOGRAPHY N/A 12/11/2019   Procedure: RIGHT/LEFT HEART CATH AND CORONARY ANGIOGRAPHY;  Surgeon: [name], MD;  Location: [place] CV LAB;  Service: Cardiovascular;  Laterality: N/A;  . TUBAL LIGATION    . XI ROBOTIC ASSISTED LOWER ANTERIOR RESECTION N/A 05/22/2018   Procedure: XI ROBOTIC ASSISTED SIGMOID COLOECTOMY MOBILIZATION OF SPLENIC FLEXURE, FIREFLY ASSESSMENT OF PERFUSION;  Surgeon: Michael Boston, MD;  Location: [facility] ORS;  Service: General;   Laterality: N/A;  ERAS PATHWAY     reports that she has never smoked. She has never used smokeless tobacco. She reports current alcohol use. She reports current drug use. Drug: Marijuana.  No Known Allergies  Family History  Problem Relation Age of Onset  . Asthma Father   . Heart attack Father   . Asthma Sister   . Lung cancer Sister   . Heart attack Mother   . Stroke Brother   . Colon cancer Neg Hx   . Esophageal cancer Neg Hx   . Pancreatic cancer Neg Hx   . Stomach cancer Neg Hx   . Liver disease Neg Hx      Prior to Admission medications   Medication Sig Start Date End Date Taking? Authorizing Provider  allopurinol (ZYLOPRIM) 300 MG tablet Take 300 mg by mouth daily.     [provider]  amiodarone (PACERONE) 200 MG tablet Take 1 tablet (200 mg total) by mouth 2 (two) times daily. 03/01/20   Lyda Jester M, PA-C  apixaban (ELIQUIS) 5 MG TABS tablet Take 1 tablet (5 mg total) by mouth 2 (two) times daily.  12/12/19   [name], MD  calcitRIOL (ROCALTROL) 0.25 MCG capsule Take 0.25 mcg by mouth 3 (three) times a week. 08/19/19   [provider]  carvedilol (COREG) 25 MG tablet Take 25 mg by mouth 2 (two) times daily with a meal.    [provider]  colchicine 0.6 MG tablet Take 0.6 mg by mouth daily as needed (gout flares).  02/06/18   [provider]  gabapentin (NEURONTIN) 300 MG capsule Take 1 capsule (300 mg total) by mouth 2 (two) times daily. 07/17/19   Billie Ruddy, MD  isosorbide-hydrALAZINE (BIDIL) 20-37.5 MG tablet Take 1 tablet by mouth 3 (three) times daily. 12/05/19   [name], MD  levothyroxine (SYNTHROID) 137 MCG tablet Take 137 mcg by mouth daily before breakfast.    [provider]  nitroGLYCERIN (NITROSTAT) 0.4 MG SL tablet Place 0.4 mg under the tongue every 5 (five) minutes as needed for chest pain.    [provider]  sertraline (ZOLOFT) 50 MG tablet Take 50 mg by mouth daily.     [provider]  spironolactone (ALDACTONE) 25 MG tablet Take 0.5 tablets (12.5 mg total) by mouth daily. 12/05/19 03/04/20  [name], MD  torsemide (DEMADEX) 20 MG tablet Take 2 tablets (40 mg total) by mouth daily. 03/01/20   Lyda Jester M, PA-C  traZODone (DESYREL) 50 MG tablet Take 50 mg by mouth at bedtime.    [provider]    Physical Exam: Vitals:   03/01/20 1441 03/01/20 1531 03/01/20 1632  BP: 127/70 (!) 120/48 (!) 117/54  Pulse: 77 69 63  Resp: 16 16 16   Temp: 98.3 F (36.8 C)    TempSrc: Oral    SpO2: 100% 100% 100%    Constitutional: NAD, calm, comfortable Vitals:   03/01/20 1441 03/01/20 1531 03/01/20 1632  BP: 127/70 (!) 120/48 (!) 117/54  Pulse: 77 69 63  Resp: 16 16 16   Temp: 98.3 F (36.8 C)    TempSrc: Oral    SpO2: 100% 100% 100%   Eyes: PERRL, lids and conjunctivae normal ENMT: Mucous membranes are moist. Posterior pharynx clear of any exudate or lesions.Normal dentition.  Neck: normal, supple, no masses, no thyromegaly Respiratory: clear to auscultation bilaterally, no wheezing, no crackles. Normal respiratory effort. No accessory muscle use.  Cardiovascular: Regular rate and rhythm, no murmurs / rubs / gallops. No extremity edema. 2+ pedal pulses. No carotid bruits.  Abdomen: no tenderness, no masses palpated. No hepatosplenomegaly. Bowel sounds positive.  Musculoskeletal: no clubbing / cyanosis. No joint deformity upper and lower extremities. Good ROM, no contractures. Normal muscle tone.  Skin: no rashes, lesions, ulcers. No induration Neurologic: CN 2-12 grossly intact. Sensation intact, DTR normal. Strength 5/5 in all 4.  Psychiatric: Normal judgment and insight. Alert and oriented x 3. Normal mood.    Labs on Admission: I have personally reviewed following labs and imaging studies  CBC: Recent Labs  Lab 03/01/20 1134 03/01/20 1456  WBC 7.1 7.6  HGB 6.9* 7.2*  HCT 22.1* 23.3*  MCV 88.8 88.6  PLT 321 297    Basic Metabolic Panel: Recent Labs  Lab 03/01/20 1134 03/01/20 1456  NA 137 137  K 4.1 4.0  CL 107 105  CO2 20* 17*  GLUCOSE 116* 138*  BUN 54* 53*  CREATININE 2.16* 2.23*  CALCIUM 9.5 9.6   GFR: Estimated Creatinine Clearance: 18.8 mL/min (A) (by C-G formula based on SCr of 2.23 mg/dL (H)). Liver Function Tests: Recent  Labs  Lab 03/01/20 1456  AST 23  ALT 13  ALKPHOS 85  BILITOT 0.6  PROT 8.1  ALBUMIN 4.1   No results for input(s): LIPASE, AMYLASE in the last 168 hours. No results for input(s): AMMONIA in the last 168 hours. Coagulation Profile: No results for input(s): INR, PROTIME in the last 168 hours. Cardiac Enzymes: No results for input(s): CKTOTAL, CKMB, CKMBINDEX, TROPONINI in the last 168 hours. BNP (last 3 results) Recent Labs    07/17/19 1352 07/23/19 1033  PROBNP 811.0* 637.0*   HbA1C: No results for input(s): HGBA1C in the last 72 hours. CBG: No results for input(s): GLUCAP in the last 168 hours. Lipid Profile: No results for input(s): CHOL, HDL, LDLCALC, TRIG, CHOLHDL, LDLDIRECT in the last 72 hours. Thyroid Function Tests: No results for input(s): TSH, T4TOTAL, FREET4, T3FREE, THYROIDAB in the last 72 hours. Anemia Panel: No results for input(s): VITAMINB12, FOLATE, FERRITIN, TIBC, IRON, RETICCTPCT in the last 72 hours. Urine analysis:    Component Value Date/Time   COLORURINE YELLOW 03/05/2018 0902   APPEARANCEUR CLEAR 03/05/2018 0902   LABSPEC 1.008 03/05/2018 0902   PHURINE 5.0 03/05/2018 0902   GLUCOSEU NEGATIVE 03/05/2018 0902   HGBUR NEGATIVE 03/05/2018 0902   HGBUR negative 02/28/2008 0000   BILIRUBINUR NEGATIVE 03/05/2018 0902   KETONESUR 5 (A) 03/05/2018 0902   PROTEINUR NEGATIVE 03/05/2018 0902   UROBILINOGEN 1.0 07/19/2010 1752   NITRITE NEGATIVE 03/05/2018 0902   LEUKOCYTESUR NEGATIVE 03/05/2018 0902    Radiological Exams on Admission: DG Chest 2 View  Result Date: 03/01/2020 CLINICAL DATA:  Shortness of breath  and anemia EXAM: CHEST - 2 VIEW COMPARISON:  May 20, 2018 FINDINGS: There is a minimal left pleural effusion. There is no edema or airspace opacity. There is cardiomegaly with pacemaker leads attached to right atrium, right ventricle, and coronary sinus. No adenopathy. There is aortic atherosclerosis. No bone lesions. IMPRESSION: Rather minimal left pleural effusion.  No edema or airspace opacity. Cardiomegaly with pacemaker leads attached to right atrium, right ventricle, and coronary sinus. No adenopathy. Aortic Atherosclerosis (ICD10-I70.0). Electronically Signed   By: Lowella Grip III M.D.   On: 03/01/2020 15:11    EKG: Independently reviewed.  Ventricular paced  Assessment/Plan Active Problems:   * No active hospital problems. *  (please populate well all problems here in Problem List. (For example, if patient is on BP meds at home and you resume or decide to hold them, it is a problem that needs to be her. Same for CAD, COPD, HLD and so on)  Acute on chronic anemia, symptomatic -Secondary to GI bleed.  Patient had EGD in 2019, at that time patient had melena.  EGD in 2019 initially showed gastric AVM but this was treated, colonoscopy showed diffuse diverticulosis throughout colon.  GI at that time believed that the bleeding was likely from gastric AVM.  Also had a capsule endoscopy in 2019 showed medium to large AVM at 2 hour and 56 minutes? Location? -Agreed with IV PPI, GI consulted we will keep patient n.p.o. for possible morning EGD. -Check iron study and reticulocyte count, may need iron supplement +/-EPO in the future -PRBc x2, n.p.o., hold off IV fluid given her history of CHF  Chronic systolic CHF -Most recent echo showed LVEF improved to 50 to 55%, compared to 45 to 50% last year. -As of now, blood pressure borderline low, clinically she looks mild hypovolemic to euvolemic, hold her BP/CHF medications and start as needed labetalol.  HTN -Hold BP  meds, including  Lasix  Chronic A. fib status post cardioversion -Stop Eliquis, no overt massive bleeding, will not order Concentra for now. -Continue amiodarone, hold Coreg  CKD stage III -Not overtly uremic.  Gout -Continue home meds  Hypothyroidism -Continue Synthroid  DVT prophylaxis: SCD Code Status: Full code Family Communication: None at bedside Disposition Plan: Expect more than 2 midnight hospital stay for GI bleed work-up. Consults called: GI [place] Admission status: Tele admit   Lequita Halt MD [facility] Pager [phone]  03/01/2020, 6:40 PM

## 2020-03-01 NOTE — Patient Instructions (Addendum)
INCREASE Torsemide to 60 mg daily for 2 days, then reduce to 40 mg daily thereafter. START Amiodarone 200  Mg, one tab twice a day   Labs today We will only contact you if something comes back abnormal or we need to make some changes. Otherwise no news is good news!  Your physician recommends that you schedule a follow-up appointment in: 2 weeks with the [facility]  Your physician recommends that you schedule a follow-up appointment in: 4-5 weeks  in the [facility] Practitioners (PA/NP) Clinic    If you have any questions or concerns before your next appointment please send [name] a message through [place] or call our office at [phone].    TO LEAVE A MESSAGE FOR THE NURSE SELECT OPTION 2, PLEASE LEAVE A MESSAGE INCLUDING: . YOUR NAME . DATE OF BIRTH . CALL BACK NUMBER . REASON FOR CALL**this is important as we prioritize the call backs  YOU WILL RECEIVE A CALL BACK THE SAME DAY AS LONG AS YOU CALL BEFORE 4:00 PM

## 2020-03-01 NOTE — ED Provider Notes (Signed)
[facility] EMERGENCY DEPARTMENT Provider Note   CSN: [id number] Arrival date & time: 03/01/20  1436     History No chief complaint on file.   Morgan Clay is a 74 y.o. female who presents emergency department with a chief complaint of low blood.  Patient states that she has a past medical history of A. fib, she is chronically anticoagulated on Eliquis.  She is status post AICD placement, has chronic heart failure.  She says that over the past few days she has been weak, short of breath, feeling like she might pass out in which she went to have routine labs drawn today she was told by her doctor that her red blood cell count had dropped rapidly and she needed to go to the emergency department.  She states that she has had this happen before and thinks she may have had a GI bleed in the past.  She denies any abdominal pain or tarry black stool.  HPI     Past Medical History:  Diagnosis Date  . A-fib ([place])    on Eliquis  . AICD (automatic cardioverter/defibrillator) present 10/06/2015  . Anemia   . ARF (acute renal failure) ([place])   . Arthritis    "qwhere" (01/03/2018)  . Asthma    reports mild asthma since childhood - had COPD on dx list from prior PCP  . Chronic bronchitis ([place])    "get it most years; not this past year though" (01/03/2018)  . Chronic kidney disease    "? stage;  followed by [facility]; [name]" (01/03/2018)  . Chronic systolic congestive heart failure ([place])   . DEPRESSION 12/17/2009   Annotation: PHQ-9 score = 14 done on 12/17/2009 Qualifier: Diagnosis of  By: Hassell Done FNP, Tori Milks    . Diabetes mellitus without complication (HCC)    DIET CONTROLLED   . Diverticulosis   . Enteritis   . FIBROIDS, UTERUS 03/05/2008  . GI bleeding 01/03/2018  . Glaucoma   . Gout   . Hepatitis C    HEPATITIS C - s/p treatment with Harvoni, saw hepatology, Dawn Drazek  . History of blood transfusion ~ 11/2017  . Hyperlipemia 12/06/2012  . HYPERTENSION,  BENIGN 04/24/2007  . Hypothyroidism   . LBBB (left bundle branch block)   . Lower GI bleeding    "been dealing w/it since 07/2017" (01/03/2018)  . Nonischemic cardiomyopathy ([place])   . OBESITY 05/27/2009  . OBSTRUCTIVE SLEEP APNEA 11/14/2007   no CPAP  . OSTEOPENIA 09/30/2008  . Pain and swelling of left upper extremity 08/08/2017  . Personal history of other infectious and parasitic disease    Hepatitis B  . Pneumonia    "several times" (01/03/2018)  . Pulmonary edema, acute ([place]) 08/09/2017    Patient Active Problem List   Diagnosis Date Noted  . Persistent atrial fibrillation ([place]) 02/06/2020  . Secondary hypercoagulable state ([place]) 02/06/2020  . CHF (congestive heart failure) ([place]) 07/23/2019  . Hepatitis C   . Gout   . Anemia in chronic kidney disease 01/18/2018  . Pulmonary hypertension ([place]) on echocardiogram 01/14/2018  . Angiodysplasia of intestinal tract 01/14/2018  . Polyp of transverse colon   . Diverticulosis of colon without hemorrhage   . Atrial fibrillation, chronic 10/25/2017  . Aortic atherosclerosis ([place]) 08/30/2017  . Diverticulitis of left colon status post robotic low anterior to sigmoid resection 05/22/2018 07/31/2017  . Hypertension associated with diabetes ([place]) 06/07/2017  . MDD (major depressive disorder), recurrent, in partial remission ([place]) 06/07/2017  .  Asthma 05/22/2017  . Chronic obstructive pulmonary disease ([place])   . Atypical atrial flutter ([place]) 09/28/2015  . Type 2 diabetes, controlled, with renal manifestation ([place]) 11/24/2013  . CKD (chronic kidney disease) stage 4, GFR 15-29 ml/min  11/24/2013  . Nonischemic cardiomyopathy ([place]) 11/22/2010  . Obesity 05/27/2009  . OSTEOPENIA 09/30/2008  . FIBROIDS, UTERUS 03/05/2008  . OBSTRUCTIVE SLEEP APNEA 11/14/2007  . Chronic diastolic CHF (congestive heart failure) ([place]) 04/24/2007  . Hypothyroidism 01/28/2007  . Biventricular ICD (implantable cardioverter-defibrillator) in place 01/08/2007     Past Surgical History:  Procedure Laterality Date  . APPENDECTOMY    . CARDIAC CATHETERIZATION    . CARDIOVERSION N/A 12/14/2014   Procedure: CARDIOVERSION;  Surgeon: Dorothy Spark, MD;  Location[facility] ENDOSCOPY;  Service: Cardiovascular;  Laterality: N/A;  . CARDIOVERSION N/A 02/26/2017   Procedure: CARDIOVERSION;  Surgeon: Evans Lance, MD;  Location: [facility] CV LAB;  Service: Cardiovascular;  Laterality: N/A;  . CARDIOVERSION N/A 07/24/2017   Procedure: CARDIOVERSION;  Surgeon: Thayer Headings, MD;  Location: [facility] ENDOSCOPY;  Service: Cardiovascular;  Laterality: N/A;  . CARDIOVERSION N/A 02/12/2020   Procedure: CARDIOVERSION;  Surgeon: [name], MD;  Location: [facility] ENDOSCOPY;  Service: Cardiovascular;  Laterality: N/A;  . COLONOSCOPY N/A 11/08/2017   Procedure: COLONOSCOPY;  Surgeon: Milus Banister, MD;  Location: [facility] ENDOSCOPY;  Service: Endoscopy;  Laterality: N/A;  . DILATION AND CURETTAGE OF UTERUS    . EP IMPLANTABLE DEVICE N/A 10/06/2015   Procedure: BIV ICD Generator Changeout;  Surgeon: [name], MD;  Location: [facility] CV LAB;  Service: Cardiovascular;  Laterality: N/A;  . ESOPHAGOGASTRODUODENOSCOPY N/A 10/26/2017   Procedure: ESOPHAGOGASTRODUODENOSCOPY (EGD);  Surgeon: Ladene Artist, MD;  Location: [facility] ENDOSCOPY;  Service: Endoscopy;  Laterality: N/A;  . ESOPHAGOGASTRODUODENOSCOPY (EGD) WITH PROPOFOL N/A 11/07/2017   Procedure: ESOPHAGOGASTRODUODENOSCOPY (EGD) WITH PROPOFOL;  Surgeon: Milus Banister, MD;  Location: [facility] ENDOSCOPY;  Service: Endoscopy;  Laterality: N/A;  . HOT HEMOSTASIS N/A 10/26/2017   Procedure: HOT HEMOSTASIS (ARGON PLASMA COAGULATION/BICAP);  Surgeon: Ladene Artist, MD;  Location: [facility] ENDOSCOPY;  Service: Endoscopy;  Laterality: N/A;  . INSERT / REPLACE / REMOVE PACEMAKER  ?2008  . POLYPECTOMY  11/08/2017   Procedure: POLYPECTOMY;  Surgeon: Milus Banister, MD;  Location: [facility] ENDOSCOPY;  Service: Endoscopy;;  . PROCTOSCOPY N/A  05/22/2018   Procedure: RIGID PROCTOSCOPY;  Surgeon: Michael Boston, MD;  Location: [facility] ORS;  Service: General;  Laterality: N/A;  . RIGHT HEART CATH N/A 02/13/2018   Procedure: RIGHT HEART CATH;  Surgeon: [name], MD;  Location: [facility] CV LAB;  Service: Cardiovascular;  Laterality: N/A;  . RIGHT/LEFT HEART CATH AND CORONARY ANGIOGRAPHY N/A 12/11/2019   Procedure: RIGHT/LEFT HEART CATH AND CORONARY ANGIOGRAPHY;  Surgeon: [name], MD;  Location: [facility] CV LAB;  Service: Cardiovascular;  Laterality: N/A;  . TUBAL LIGATION    . XI ROBOTIC ASSISTED LOWER ANTERIOR RESECTION N/A 05/22/2018   Procedure: XI ROBOTIC ASSISTED SIGMOID COLOECTOMY MOBILIZATION OF SPLENIC FLEXURE, FIREFLY ASSESSMENT OF PERFUSION;  Surgeon: Michael Boston, MD;  Location: [facility] ORS;  Service: General;  Laterality: N/A;  ERAS PATHWAY     OB History   No obstetric history on file.     Family History  Problem Relation Age of Onset  . Asthma Father   . Heart attack Father   . Asthma Sister   . Lung cancer Sister   . Heart attack Mother   . Stroke Brother   .  Colon cancer Neg Hx   . Esophageal cancer Neg Hx   . Pancreatic cancer Neg Hx   . Stomach cancer Neg Hx   . Liver disease Neg Hx     Social History   Tobacco Use  . Smoking status: Never Smoker  . Smokeless tobacco: Never Used  Vaping Use  . Vaping Use: Never used  Substance Use Topics  . Alcohol use: Yes    Comment: 01/03/2018 "couple drinks/month"  . Drug use: Yes    Types: Marijuana    Comment: VERY INTERMITTENT     Home Medications Prior to Admission medications   Medication Sig Start Date End Date Taking? Authorizing Provider  allopurinol (ZYLOPRIM) 300 MG tablet Take 300 mg by mouth daily.     [provider]  amiodarone (PACERONE) 200 MG tablet Take 1 tablet (200 mg total) by mouth 2 (two) times daily. 03/01/20   Lyda Jester M, PA-C  apixaban (ELIQUIS) 5 MG TABS tablet Take 1 tablet (5 mg total) by mouth 2  (two) times daily. 12/12/19   [name], MD  calcitRIOL (ROCALTROL) 0.25 MCG capsule Take 0.25 mcg by mouth 3 (three) times a week. 08/19/19   [provider]  carvedilol (COREG) 25 MG tablet Take 25 mg by mouth 2 (two) times daily with a meal.    [provider]  colchicine 0.6 MG tablet Take 0.6 mg by mouth daily as needed (gout flares).  02/06/18   [provider]  gabapentin (NEURONTIN) 300 MG capsule Take 1 capsule (300 mg total) by mouth 2 (two) times daily. 07/17/19   Billie Ruddy, MD  isosorbide-hydrALAZINE (BIDIL) 20-37.5 MG tablet Take 1 tablet by mouth 3 (three) times daily. 12/05/19   [name], MD  levothyroxine (SYNTHROID) 137 MCG tablet Take 137 mcg by mouth daily before breakfast.    [provider]  nitroGLYCERIN (NITROSTAT) 0.4 MG SL tablet Place 0.4 mg under the tongue every 5 (five) minutes as needed for chest pain.    [provider]  sertraline (ZOLOFT) 50 MG tablet Take 50 mg by mouth daily.    [provider]  spironolactone (ALDACTONE) 25 MG tablet Take 0.5 tablets (12.5 mg total) by mouth daily. 12/05/19 03/04/20  [name], MD  torsemide (DEMADEX) 20 MG tablet Take 2 tablets (40 mg total) by mouth daily. 03/01/20   Lyda Jester M, PA-C  traZODone (DESYREL) 50 MG tablet Take 50 mg by mouth at bedtime.    [provider]    Allergies    Patient has no known allergies.  Review of Systems   Review of Systems Ten systems reviewed and are negative for acute change, except as noted in the HPI.   Physical Exam Updated Vital Signs BP (!) 117/54 (BP Location: Right Arm)   Pulse 63   Temp 98.3 F (36.8 C) (Oral)   Resp 16   SpO2 100%   Physical Exam Physical Exam  Nursing note and vitals reviewed. Constitutional: She is oriented to person, place, and time. She appears well-developed and well-nourished. No distress.  HENT:  Head: Normocephalic and atraumatic.  Eyes: Conjunctivae  normal and EOM are normal. Pupils are equal, round, and reactive to light. No scleral icterus.  Neck: Normal range of motion.  Cardiovascular: Normal rate, regular rhythm and normal heart sounds.  Exam reveals no gallop and no friction rub.   No murmur heard. Pulmonary/Chest: Effort normal and breath sounds normal. No respiratory distress.  Abdominal: Soft. Bowel  sounds are normal. She exhibits no distension and no mass. There is no tenderness. There is no guarding.  RK:[id number] Rectal Exam reveals sphincter with good tone. No external hemorrhoids. No masses or fissures. Stool color is brown and red. Neurological: She is alert and oriented to person, place, and time.  Skin: Skin is warm and dry. She is not diaphoretic.    ED Results / Procedures / Treatments   Labs (all labs ordered are listed, but only abnormal results are displayed) Labs Reviewed  CBC - Abnormal; Notable for the following components:      Result Value   RBC 2.63 (*)    Hemoglobin 7.2 (*)    HCT 23.3 (*)    RDW 23.2 (*)    nRBC 0.3 (*)    All other components within normal limits  COMPREHENSIVE METABOLIC PANEL - Abnormal; Notable for the following components:   CO2 17 (*)    Glucose, Bld 138 (*)    BUN 53 (*)    Creatinine, Ser 2.23 (*)    GFR, Estimated 23 (*)    All other components within normal limits  POC OCCULT BLOOD, ED - Abnormal; Notable for the following components:   Fecal Occult Bld POSITIVE (*)    All other components within normal limits  TYPE AND SCREEN    EKG None  Radiology DG Chest 2 View  Result Date: 03/01/2020 CLINICAL DATA:  Shortness of breath and anemia EXAM: CHEST - 2 VIEW COMPARISON:  May 20, 2018 FINDINGS: There is a minimal left pleural effusion. There is no edema or airspace opacity. There is cardiomegaly with pacemaker leads attached to right atrium, right ventricle, and coronary sinus. No adenopathy. There is aortic atherosclerosis. No bone lesions. IMPRESSION: Rather  minimal left pleural effusion.  No edema or airspace opacity. Cardiomegaly with pacemaker leads attached to right atrium, right ventricle, and coronary sinus. No adenopathy. Aortic Atherosclerosis (ICD10-I70.0). Electronically Signed   By: Lowella Grip III M.D.   On: 03/01/2020 15:11    Procedures Procedures (including critical care time)  Medications Ordered in ED Medications - No data to display  ED Course  I have reviewed the triage vital signs and the nursing notes.  Pertinent labs & imaging results that were available during my care of the patient were reviewed by me and considered in my medical decision making (see chart for details).    MDM Rules/Calculators/A&P                          SE:[id number] VS: BP (!) 160/80   Pulse (!) 59   Temp 98.4 F (36.9 C) (Oral)   Resp (!) 24   SpO2 95%   YW:VPXTGGY is gathered by  Patient and emr. Previous records obtained and reviewed. DDX:The patient's complaint of weakness involves an extensive number of diagnostic and treatment options, and is a complaint that carries with it a high risk of complications, morbidity, and potential mortality. Given the large differential diagnosis, medical decision making is of high complexity. The differential diagnosis of weakness includes but is not limited to neurologic causes (GBS, myasthenia gravis, CVA, MS, ALS, transverse myelitis, spinal cord injury, CVA, botulism, ) and other causes: ACS, Arrhythmia, syncope, orthostatic hypotension, sepsis, hypoglycemia, electrolyte disturbance, hypothyroidism, respiratory failure, symptomatic anemia, dehydration, heat injury, polypharmacy, malignancy.  Labs: I ordered reviewed and interpreted labs which include  cmp with creatinine and BUN at baseline CBC with hemoglobin of 7.2 representing 3 g drop over  the past 2 weeks rare Occult stool positive  Imaging: I ordered and reviewed images which included 2 view chest x-ray there letter. I independently  visualized and interpreted all imaging. There are no acute, significant findings on today's images. EKG: The ventricular paced rhythm at a rate of 65 Consults: Dr. Rush Landmark with gastroenterology. He states that the day team will consult on the patient.  He asked that she remain n.p.o. after midnight, hold anticoagulation unless it is absolutely necessary and then we can use heparin.    MDM: Patient here with 3 g drop in her hemoglobin over the past 2 weeks.  She has a history of previous AVM in the jejunum which may be the cause of her bleed today.  Patient is hemodynamically stable here in the emergency department will be admitted to the hospitalist service by Dr. Delice Lesch  Patient disposition:The patient appears reasonably stabilized for admission considering the current resources, flow, and capabilities available in the ED at this time, and I doubt any other [facility] requiring further screening and/or treatment in the ED prior to admission.  Morgan Clay was evaluated in Emergency Department on 03/01/2020 for the symptoms described in the history of present illness. She was evaluated in the context of the global COVID-19 pandemic, which necessitated consideration that the patient might be at risk for infection with the SARS-CoV-2 virus that causes COVID-19. Institutional protocols and algorithms that pertain to the evaluation of patients at risk for COVID-19 are in a state of rapid change based on information released by regulatory bodies including the CDC and federal and state organizations. These policies and algorithms were followed during the patient's care in the ED.       Final Clinical Impression(s) / ED Diagnoses Final diagnoses:  None    Rx / DC Orders ED Discharge Orders    None       Margarita Mail, PA-C 03/01/20 2108    Gareth Morgan, MD 03/04/20 [phone]

## 2020-03-01 NOTE — Progress Notes (Signed)
EPIC Encounter for ICM Monitoring  Patient Name: Morgan Clay is a 74 y.o. female Date: 03/01/2020 Primary Care Physican: Billie Ruddy, MD Primary Cardiologist:Mc[place] Electrophysiologist: Caryl Comes Nephrologist: [name] Bi-V Pacing:98.6% 8/23/2021OfficeWeight:148lbs  Since 12-Feb-2020  Time in AT/AF 10.0 hr/day (41.8%)  Longest AT/AF 7 days   Transmission reviewed. Patient being seen by Ellen Henri, PA at [facility] clinic today, 03/01/2020.   Optivol thoracic impedancesuggesting possible fluid level accumulation since 02/22/20 but trending back toward baseline.  Prescribed:  Torsemide20 mgTake1.5tablets (30 mg total)daily.  Spironolactone 25 mg take 0.5 tablet (12.5 mg total) daily  Eliquis 5 mg take 1 tablet twice a day  Labs: 01/06/2020 Creatinine 2.57, BUN 69, Potassium 4.9, Sodium 130, GFR 18-21 12/29/2019 Creatinine 2.88, BUN 80, Potassium 3.9, Sodium 132, GFR 16-18 12/11/2019 Potassium3.6, Sodium136 12/05/2019 Creatinine1.94, BUN35, Potassium4.0, Sodium136, GFR25-29  10/02/2019 Creatinine1.69, BUN34, Potassium3.6, Sodium135 07/23/2019 Creatinine1.89, BUN39, Potassium3.6, Sodium139, BRA30.94  A complete set of results can be found in Results Review.  Recommendations: Recommendations will be given by HF clinic today.  Follow-up plan: ICM clinic phone appointment on11/05/2019 to recheck fluid levels. 91 day device clinic remote transmission12/28/2021.  EP/Cardiology Office Visits:03/01/2020 with Dr.McLean.Last EP telehealth visit was 07/24/2019 (no recall)  Copy of ICM check sent to [place] and Ellen Henri, PA.    3 month ICM trend: 03/01/2020    1 Year ICM trend:       [name], RN 03/01/2020 11:26 AM

## 2020-03-01 NOTE — ED Triage Notes (Signed)
Pt sent over by MD office for sob and low hgb of 6.9, was 10 2 weeks ago , pt has hx of same last transfusion over 1 year ago she thinks ,

## 2020-03-01 NOTE — Progress Notes (Signed)
PCP: Dr. Colin Benton Cardiology: Dr. Caryl Comes HF Cardiology: Dr. [name]  74 y.o. with history of nonischemic cardiomyopathy with recovery of EF with BiV pacing, recurrent GI bleeding, and paroxysmal atrial flutter was referred by Dr. Caryl Comes for evaluation of CHF.  Patient has a history of presumed nonischemic cardiomyopathy for a number of years.  Echo in 12/15 showed EF 25-30% and Cardiolite at that time showed no ischemia.  She had Medtronic CRT-D system placed, and EF recovered. In 4/19, echo showed EF 60-65%.    She has additionally had problems with GI bleeding.  She had been on Eliquis for atrial flutter but this was stopped with recurrent GI bleeding.  She had bleeding from a gastric ulcer but was also noted to have multiple small bowel AVMs on capsule endoscopy out of reach of endoscope.  Eliquis was stopped. She has had no melena or BRBPR off Eliquis.   Echo in 10/19 showed EF 55-60%, mild-moderate MR, moderate AI, moderate RV dilation.   She was admitted in 1/20 with diverticulitis and ended up having a sigmoid colectomy.    Echo in 9/20 showed EF lower at 45-50%, moderate AI.   She had LHC/RHC in 8/21 with nonobstructive CAD, normal filling pressures, and preserved cardiac output. Echo 8/21 showed EF 50-55%, mild LVH, mildly decreased RV systolic function, moderate aortic insufficiency.   She presents to clinic today for f/u. Recently found to have persistent atrial fibrillation on device interrogation and was set up for DCCV on 10/7. Today in f/u, she is back in atrial flutter w/ CVR. Based on device interrogation, it appears that she reverted back into Afib ~10/15, persistent. Avg HR in the 70s. Fluid status also trending up based on Optivol. Impedance down. Index trending up and close to crossing threshold. She is on torsemide 30 mg daily. She is symptomatic w/ fatigue. Reports full compliance w/ Eliquis.    ECG (personally reviewed): Atrial flutter 63 bpm   Labs (9/19):  Hgb 10.2, K  3.8, creatinine 2.5 Labs (02/26/2018): K 3.4, creatinine 2.2, WBC 76.  Labs (2/20): K 3.9, creatinine 1.8 Labs (8/20): K 3.8, creatinine 1.98 Labs (3/21): LDL 33 Labs (5/21): K 3.6, creatinine 1.69 Labs (7/21): K 4, creatinine 1.94 Labs (9/21): K 4.5, creatinine 2.24, TSH normal, HFTs normal   PMH: 1. Diabetes: diet-controlled.  2. Gout 3. HCV: Treated with Harvoni.  4. Atrial flutter: Atypical.  Had DCCV in 3/19, maintained on amiodarone.  5. HTN 6. CKD stage 3: Sees nephrology, [name].  7. H/o SBO 8. Obesity 9. Nonischemic cardiomyopathy: Echo in 12/15 with EF 25-30%.  Cardiolite in 12/15 with no ischemia.  She had a Medtronic CRT-D device placed with recovery of EF.   - Echo (4/19): EF 60-65% with moderate AI, moderate MR, and PASP 49 mmHg.  - RHC 02/13/2018 RA mean 6, RV 49/7,PA 50/18, mean 31,PCWP mean 20, Oxygen saturations:PA 67% AO 97%, Cardiac Output (Fick) 4.6, Cardiac Index (Fick) 2.9, PVR 2.4 WU - Echo (10/19): EF 55-60%, mild-moderate MR, moderate AI, moderate RV dilation, PASP 60 mmHg.  - PYP scan (3/20): H/CL 1.3, grade 1-2 visually.  Unlikely transthyretin cardiac amyloidosis.  - Echo (9/20): EF 45-50%, moderate aortic insufficiency.  - LHC/RHC (8/21): 40% pLAD, 50% D1; mean RA 3, PA 41/16, mean PCWP 14, CI 2.74, PVR 2.99.  - Echo (8/21): EF 50-55%, mild LVH, grade 2 diastolic dysfunction, normal RV size with mildly decreased systolic function, moderate AI, no AS.  10. GI bleeding: Gastric AVM and also  gastric ulcer seen in past.  - 8/19 capsule endoscopy showed small bowel AVMs beyond the reach of endoscopy.  - With recurrent episodes of GI bleeding, Eliquis was stopped.  11. Fe deficiency anemia.  12. Depression 13. OSA: Uses CPAP.  14. Diverticulitis: Sigmoid colectomy in 1/20.   Social History   Socioeconomic History  . Marital status: Divorced    Spouse name: Not on file  . Number of children: 1  . Years of education: 60  . Highest education level: Not  on file  Occupational History  . Not on file  Tobacco Use  . Smoking status: Never Smoker  . Smokeless tobacco: Never Used  Vaping Use  . Vaping Use: Never used  Substance and Sexual Activity  . Alcohol use: Yes    Comment: 01/03/2018 "couple drinks/month"  . Drug use: Yes    Types: Marijuana    Comment: VERY INTERMITTENT   . Sexual activity: Not Currently  Other Topics Concern  . Not on file  Social History Narrative   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      Home Situation: lives alone      Spiritual Beliefs: Christian - Baptist      Lifestyle: no regular exercise; poor diet      She is divorced at least one adult child   Never smoker    rare alcohol to occasional at best    no substance abuse         Social Determinants of Radio broadcast assistant Strain:   . Difficulty of Paying Living Expenses: Not on file  Food Insecurity:   . Worried About Charity fundraiser in the Last Year: Not on file  . Ran Out of Food in the Last Year: Not on file  Transportation Needs:   . Lack of Transportation (Medical): Not on file  . Lack of Transportation (Non-Medical): Not on file  Physical Activity:   . Days of Exercise per Week: Not on file  . Minutes of Exercise per Session: Not on file  Stress:   . Feeling of Stress : Not on file  Social Connections:   . Frequency of Communication with Friends and Family: Not on file  . Frequency of Social Gatherings with Friends and Family: Not on file  . Attends Religious Services: Not on file  . Active Member of Clubs or Organizations: Not on file  . Attends Archivist Meetings: Not on file  . Marital Status: Not on file  Intimate Partner Violence:   . Fear of Current or Ex-Partner: Not on file  . Emotionally Abused: Not on file  . Physically Abused: Not on file  . Sexually Abused: Not on file   Family History  Problem Relation Age of Onset  . Asthma Father   . Heart attack Father   . Asthma Sister   . Lung  cancer Sister   . Heart attack Mother   . Stroke Brother   . Colon cancer Neg Hx   . Esophageal cancer Neg Hx   . Pancreatic cancer Neg Hx   . Stomach cancer Neg Hx   . Liver disease Neg Hx    ROS: All systems reviewed and negative except as per HPI.   Current Outpatient Medications  Medication Sig Dispense Refill  . allopurinol (ZYLOPRIM) 300 MG tablet Take 300 mg by mouth daily.     [name] apixaban (ELIQUIS) 5 MG TABS tablet Take 1 tablet (5 mg total) by mouth 2 (  two) times daily. 60 tablet 11  . calcitRIOL (ROCALTROL) 0.25 MCG capsule Take 0.25 mcg by mouth 3 (three) times a week.    . carvedilol (COREG) 25 MG tablet Take 25 mg by mouth 2 (two) times daily with a meal.    . colchicine 0.6 MG tablet Take 0.6 mg by mouth daily as needed (gout flares).   0  . gabapentin (NEURONTIN) 300 MG capsule Take 1 capsule (300 mg total) by mouth 2 (two) times daily. 180 capsule 2  . isosorbide-hydrALAZINE (BIDIL) 20-37.5 MG tablet Take 1 tablet by mouth 3 (three) times daily. 270 tablet 2  . levothyroxine (SYNTHROID) 137 MCG tablet Take 137 mcg by mouth daily before breakfast.    . nitroGLYCERIN (NITROSTAT) 0.4 MG SL tablet Place 0.4 mg under the tongue every 5 (five) minutes as needed for chest pain.    [name] sertraline (ZOLOFT) 50 MG tablet Take 50 mg by mouth daily.    [name] spironolactone (ALDACTONE) 25 MG tablet Take 0.5 tablets (12.5 mg total) by mouth daily. 45 tablet 3  . torsemide (DEMADEX) 20 MG tablet Take 1.5 tablets (30 mg total) by mouth daily. 180 tablet 3  . traZODone (DESYREL) 50 MG tablet Take 50 mg by mouth at bedtime.     No current facility-administered medications for this encounter.   BP 115/70   Pulse 80   Wt 65.8 kg   SpO2 100%   BMI 29.29 kg/m   Wt Readings from Last 3 Encounters:  03/01/20 65.8 kg  02/12/20 63.5 kg  02/06/20 65.2 kg   PHYSICAL EXAM:  General:  Well appearing. No respiratory difficulty HEENT: normal Neck: supple. no JVD. Carotids 2+ bilat; no bruits. No  lymphadenopathy or thyromegaly appreciated. Cor: PMI nondisplaced. Irregular rhythm, regular rate. No rubs, gallops or murmurs. Lungs: clear Abdomen: soft, nontender, nondistended. No hepatosplenomegaly. No bruits or masses. Good bowel sounds. Extremities: no cyanosis, clubbing, rash, edema Neuro: alert & oriented x 3, cranial nerves grossly intact. moves all 4 extremities w/o difficulty. Affect pleasant.   Assessment/Plan: 1. Heart failure with mid-range EF: Patient has history of presumed nonischemic cardiomyopathy (Cardiolite with no ischemia, cath not done with CKD). However, echo in 4/19 showed EF improved to 60-65% with BiV pacing.  Echo in 10/19 showed stable EF 55-60% with mild-moderate MR, moderate AI.  PYP scan in 3/20 was equivocal for transthyretin amyloidosis. However, echo in 9/20 showed EF back down to 45-50%.  LHC/RHC in 8/21 showed nonobstructive CAD, normal filling pressures and cardiac output.  Echo 8/21 showed EF 50-55%, mildly decreased RV systolic function, moderate AI.  - NYHA II-III - she is mildly fluid overloaded by Optivol fluid analysis, in the setting of persistent atrial fibrillation/flutter  - increase torsemide to 60 mg daily x 2 days, then reduce to 40 mg daily  - Will attempt repeat DCCV after loading w/ amiodarone - Continue Bidil 1 tab tid.  - Continue spironolactone 12.5 daily (CKD limits further titration) - Dr. [name] recommended repeating PYP scan as study 1.5 years ago was equivocal. This has been ordered. Will schedule post repeat DCCV   2. Atrial flutter/fibrillation: History of atypical flutter. Failed recent DCCV 02/12/20. Back in Afib/Flutter, persistent since ~10/15 and rate controlled - start amiodarone 200 mg bid - Continue apixaban 5 mg bid.  - repeat EKG in 2 weeks, if still in Afib/Flutter will attempt repeat DCCV 3. CKD: Stage 3, followed by nephrology.   - check BMP today  4. GI bleeding: has had gastric  ulcer and gastric AVMs as well as  small bowel AVMs.  She is now on Eliquis and has not had recent bleeding. Followed by GI.   - notes fatigue, likely from Afib/flutter but will check CBC today  5. Hypertension: controlled on current regimen   RTC in 2 weeks for repeat EKG. Arrange DCCV if still in Afib.    Lyda Jester, PA-C 03/01/2020

## 2020-03-02 ENCOUNTER — Encounter (HOSPITAL_COMMUNITY): Payer: Self-pay | Admitting: Internal Medicine

## 2020-03-02 ENCOUNTER — Inpatient Hospital Stay (HOSPITAL_COMMUNITY): Payer: Medicare Other | Admitting: Certified Registered Nurse Anesthetist

## 2020-03-02 ENCOUNTER — Encounter (HOSPITAL_COMMUNITY)
Admission: EM | Disposition: A | Discharge status: Home / Self Care | Payer: Self-pay | Source: Home / Self Care | Attending: Internal Medicine

## 2020-03-02 DIAGNOSIS — K922 Gastrointestinal hemorrhage, unspecified: Secondary | ICD-10-CM | POA: Diagnosis not present

## 2020-03-02 DIAGNOSIS — K7460 Unspecified cirrhosis of liver: Secondary | ICD-10-CM | POA: Diagnosis present

## 2020-03-02 DIAGNOSIS — I8510 Secondary esophageal varices without bleeding: Secondary | ICD-10-CM | POA: Diagnosis not present

## 2020-03-02 DIAGNOSIS — E663 Overweight: Secondary | ICD-10-CM

## 2020-03-02 DIAGNOSIS — K5520 Angiodysplasia of colon without hemorrhage: Secondary | ICD-10-CM | POA: Diagnosis not present

## 2020-03-02 DIAGNOSIS — D649 Anemia, unspecified: Secondary | ICD-10-CM | POA: Diagnosis not present

## 2020-03-02 DIAGNOSIS — Z7901 Long term (current) use of anticoagulants: Secondary | ICD-10-CM | POA: Diagnosis not present

## 2020-03-02 DIAGNOSIS — K31811 Angiodysplasia of stomach and duodenum with bleeding: Principal | ICD-10-CM

## 2020-03-02 HISTORY — PX: ENTEROSCOPY: SHX5533

## 2020-03-02 HISTORY — PX: HOT HEMOSTASIS: SHX5433

## 2020-03-02 HISTORY — DX: Overweight: E66.3

## 2020-03-02 HISTORY — PX: SUBMUCOSAL TATTOO INJECTION: SHX6856

## 2020-03-02 LAB — TYPE AND SCREEN
ABO/RH(D): O POS
Antibody Screen: NEGATIVE
Unit division: 0
Unit division: 0

## 2020-03-02 LAB — BPAM RBC
Blood Product Expiration Date: 202111232359
Blood Product Expiration Date: 202111232359
ISSUE DATE / TIME: 202110251958
ISSUE DATE / TIME: 202110252328
Unit Type and Rh: 5100
Unit Type and Rh: 5100

## 2020-03-02 LAB — IRON AND TIBC
Iron: 34 ug/dL (ref 28–170)
Saturation Ratios: 7 % — ABNORMAL LOW (ref 10.4–31.8)
TIBC: 505 ug/dL — ABNORMAL HIGH (ref 250–450)
UIBC: 471 ug/dL

## 2020-03-02 LAB — CBC
HCT: 27.5 % — ABNORMAL LOW (ref 36.0–46.0)
Hemoglobin: 8.9 g/dL — ABNORMAL LOW (ref 12.0–15.0)
MCH: 27.5 pg (ref 26.0–34.0)
MCHC: 32.4 g/dL (ref 30.0–36.0)
MCV: 84.9 fL (ref 80.0–100.0)
Platelets: 273 10*3/uL (ref 150–400)
RBC: 3.24 MIL/uL — ABNORMAL LOW (ref 3.87–5.11)
RDW: 19.6 % — ABNORMAL HIGH (ref 11.5–15.5)
WBC: 6.7 10*3/uL (ref 4.0–10.5)
nRBC: 0 % (ref 0.0–0.2)

## 2020-03-02 LAB — BASIC METABOLIC PANEL
Anion gap: 9 (ref 5–15)
BUN: 52 mg/dL — ABNORMAL HIGH (ref 8–23)
CO2: 21 mmol/L — ABNORMAL LOW (ref 22–32)
Calcium: 9.2 mg/dL (ref 8.9–10.3)
Chloride: 108 mmol/L (ref 98–111)
Creatinine, Ser: 2.17 mg/dL — ABNORMAL HIGH (ref 0.44–1.00)
GFR, Estimated: 24 mL/min — ABNORMAL LOW (ref >60)
Glucose, Bld: 86 mg/dL (ref 70–99)
Potassium: 3.7 mmol/L (ref 3.5–5.1)
Sodium: 138 mmol/L (ref 135–145)

## 2020-03-02 LAB — RETICULOCYTES
Immature Retic Fract: 29.4 % — ABNORMAL HIGH (ref 2.3–15.9)
RBC.: 3.18 MIL/uL — ABNORMAL LOW (ref 3.87–5.11)
Retic Count, Absolute: 84 10*3/uL (ref 19.0–186.0)
Retic Ct Pct: 2.6 % (ref 0.4–3.1)

## 2020-03-02 SURGERY — ENTEROSCOPY
Anesthesia: Monitor Anesthesia Care

## 2020-03-02 MED ORDER — PHENYLEPHRINE HCL (PRESSORS) 10 MG/ML IV SOLN
INTRAVENOUS | Status: DC | PRN
  Administered 2020-03-02: 40 ug via INTRAVENOUS

## 2020-03-02 MED ORDER — LIDOCAINE HCL (CARDIAC) PF 100 MG/5ML IV SOSY
PREFILLED_SYRINGE | INTRAVENOUS | Status: DC | PRN
  Administered 2020-03-02: 100 mg via INTRAVENOUS

## 2020-03-02 MED ORDER — PROPOFOL 10 MG/ML IV BOLUS
INTRAVENOUS | Status: DC | PRN
  Administered 2020-03-02 (×2): 20 mg via INTRAVENOUS

## 2020-03-02 MED ORDER — TRAMADOL HCL 50 MG PO TABS
50.0000 mg | ORAL_TABLET | Freq: Two times a day (BID) | ORAL | Status: AC | PRN
  Administered 2020-03-02: 50 mg via ORAL
  Filled 2020-03-02: qty 1

## 2020-03-02 MED ORDER — SPOT INK MARKER SYRINGE KIT
PACK | SUBMUCOSAL | Status: DC | PRN
  Administered 2020-03-02: 3.5 mL via SUBMUCOSAL

## 2020-03-02 MED ORDER — SODIUM CHLORIDE 0.9 % IV SOLN: INTRAVENOUS | Status: DC | PRN

## 2020-03-02 MED ORDER — ZOLPIDEM TARTRATE 5 MG PO TABS
5.0000 mg | ORAL_TABLET | Freq: Once | ORAL | Status: AC
  Administered 2020-03-02: 5 mg via ORAL
  Filled 2020-03-02: qty 1

## 2020-03-02 MED ORDER — GLYCOPYRROLATE 0.2 MG/ML IJ SOLN
INTRAMUSCULAR | Status: DC | PRN
  Administered 2020-03-02: .2 mg via INTRAVENOUS

## 2020-03-02 MED ORDER — FUROSEMIDE 10 MG/ML IJ SOLN
20.0000 mg | Freq: Two times a day (BID) | INTRAMUSCULAR | Status: AC
  Administered 2020-03-02 – 2020-03-03 (×2): 20 mg via INTRAVENOUS
  Filled 2020-03-02 (×2): qty 2

## 2020-03-02 MED ORDER — SPOT INK MARKER SYRINGE KIT
PACK | SUBMUCOSAL | Status: AC
  Filled 2020-03-02: qty 5

## 2020-03-02 MED ORDER — PROPOFOL 500 MG/50ML IV EMUL
INTRAVENOUS | Status: DC | PRN
  Administered 2020-03-02: 75 ug/kg/min via INTRAVENOUS

## 2020-03-02 NOTE — Anesthesia Preprocedure Evaluation (Signed)
Anesthesia Evaluation    Reviewed: Allergy & Precautions, NPO status , Patient's Chart, lab work & pertinent test results  History of Anesthesia Complications Negative for: history of anesthetic complications  Airway Mallampati: IV  TM Distance: >3 FB Neck ROM: Full    Dental  (+) Partial Upper, Partial Lower, Dental Advisory Given   Pulmonary shortness of breath and with exertion, asthma , neg sleep apnea, COPD, neg recent URI, neg PE Covid-19 Nucleic Acid Test Results Lab Results      Component                Value               Date                      [place]              NEGATIVE            03/01/2020                [place]              NEGATIVE            02/09/2020                [place]              NEGATIVE            12/11/2019              breath sounds clear to auscultation       Cardiovascular hypertension, Pt. on medications and Pt. on home beta blockers (-) angina+CHF  (-) Past MI + dysrhythmias Atrial Fibrillation + Cardiac Defibrillator + Valvular Problems/Murmurs AI and MR  Rhythm:Irregular  Left ventricular ejection fraction, by estimation, is 50 to 55%. The  left ventricle has low normal function. The left ventricle has no regional  wall motion abnormalities. There is moderate left ventricular hypertrophy.  Left ventricular diastolic  parameters are consistent with Grade II diastolic dysfunction  (pseudonormalization).  2. Right ventricular systolic function is mildly reduced. The right  ventricular size is normal. There is normal pulmonary artery systolic  pressure. The estimated right ventricular systolic pressure is 01.0 mmHg.  3. Left atrial size was severely dilated.  4. The mitral valve is normal in structure. Mild mitral valve  regurgitation. No evidence of mitral stenosis.  5. The aortic valve is tricuspid. Aortic valve regurgitation is moderate.  No aortic stenosis is present.  6.  No holodiastolic flow reversal in the descending thoracic aorta  doppler signal.  7. The inferior vena cava is normal in size with greater than 50%  respiratory variability, suggesting right atrial pressure of 3 mmHg.    Neuro/Psych PSYCHIATRIC DISORDERS Depression negative neurological ROS     GI/Hepatic (+) Hepatitis -, C.possible gi bleed   Endo/Other  diabetesHypothyroidism   Renal/GU CRFRenal diseaseLab Results      Component                Value               Date                      CREATININE               2.17 (H)            03/02/2020  Lab Results      Component                Value               Date                      K                        3.7                 03/02/2020                Musculoskeletal  (+) Arthritis ,   Abdominal   Peds  Hematology  (+) Blood dyscrasia, anemia , Lab Results      Component                Value               Date                      WBC                      6.7                 03/02/2020                HGB                      8.9 (L)             03/02/2020                HCT                      27.5 (L)            03/02/2020                MCV                      84.9                03/02/2020                PLT                      273                 03/02/2020              Anesthesia Other Findings medtronic ddd 60, aicd therapy on  Reproductive/Obstetrics                             Anesthesia Physical Anesthesia Plan  ASA: III  Anesthesia Plan: MAC   Post-op Pain Management:    Induction: Intravenous  PONV Risk Score and Plan: 2 and Propofol infusion  Airway Management Planned: Nasal Cannula  Additional Equipment: None  Intra-op Plan:   Post-operative Plan: Extubation in OR  Informed Consent: I have reviewed the patients History and Physical, chart, labs and discussed the procedure including the risks, benefits and alternatives for the proposed anesthesia with  the patient or authorized representative who has indicated his/her understanding and acceptance.     Dental advisory given  Plan Discussed with: CRNA and Surgeon  Anesthesia Plan Comments:         Anesthesia Quick Evaluation

## 2020-03-02 NOTE — Progress Notes (Signed)
Initial Nutrition Assessment  RD working remotely.  DOCUMENTATION CODES:   Not applicable  INTERVENTION:   -RD will follow for diet advancement and add supplements as appropriate  NUTRITION DIAGNOSIS:   Inadequate oral intake related to altered GI function as evidenced by NPO status.  GOAL:   Patient will meet greater than or equal to 90% of their needs  MONITOR:   Diet advancement, Labs, Weight trends, Skin, I & O's  REASON FOR ASSESSMENT:   Malnutrition Screening Tool    ASSESSMENT:   Morgan Clay is a 74 y.o. female with medical history significant of A. fib on Eliquis, chronic systolic CHF, AICD, CKD stage III, hypothyroidism, anxiety depression, gout presented with increasing shortness breath and generalized weakness.  Pt admitted with acute on chronic anemia secondary to GIB.   Reviewed I/O's: +754 ml x 24 hours  Attempted to speak with pt via call to hospital room phone, however, unable to reach.   Per MD notes, pt is awaiting GI consult for possible EGD. She is currently NPO.  Reviewed wt hx; pt has experienced a 2.7% wt loss over the past 3 months, which is not significant for time frame.   Labs reviewed.   Diet Order:   Diet Order            Diet NPO time specified Except for: Sips with Meds  Diet effective now                 EDUCATION NEEDS:   No education needs have been identified at this time  Skin:  Skin Assessment: Reviewed RN Assessment  Last BM:  03/01/20  Height:   Ht Readings from Last 1 Encounters:  02/12/20 4\' 11"  (1.499 m)    Weight:   Wt Readings from Last 1 Encounters:  03/01/20 65.8 kg    Ideal Body Weight:  44.5 kg  BMI:  There is no height or weight on file to calculate BMI.  Estimated Nutritional Needs:   Kcal:  1650-1850  Protein:  80-95 grams  Fluid:  > 1.6 L    Loistine Chance, RD, LDN, [place] Registered Dietitian II Certified Diabetes Care and Education Specialist Please refer to [facility] for RD  and/or RD on-call/weekend/after hours pager

## 2020-03-02 NOTE — Transfer of Care (Signed)
Immediate Anesthesia Transfer of Care Note  Patient: Morgan Clay  Procedure(s) Performed: ENTEROSCOPY (N/A ) SUBMUCOSAL TATTOO INJECTION HOT HEMOSTASIS (ARGON PLASMA COAGULATION/BICAP) (N/A )  Patient Location: PACU and Endoscopy Unit  Anesthesia Type:MAC  Level of Consciousness: drowsy  Airway & Oxygen Therapy: Patient Spontanous Breathing and Patient connected to nasal cannula oxygen  Post-op Assessment: Report given to RN, Post -op Vital signs reviewed and stable and Patient moving all extremities  Post vital signs: Reviewed and stable  Last Vitals:  Vitals Value Taken Time  BP 131/48 03/02/20 1047  Temp    Pulse 59 03/02/20 1050  Resp 19 03/02/20 1050  SpO2 99 % 03/02/20 1050  Vitals shown include unvalidated device data.  Last Pain:  Vitals:   03/02/20 0924  TempSrc: Oral  PainSc: 0-No pain         Complications: No complications documented.

## 2020-03-02 NOTE — H&P (View-Only) (Signed)
Consultation  Referring Provider:     Wynetta Fines MD Primary Care Physician:  Billie Ruddy, MD Primary Gastroenterologist:   Silvano Rusk MD      Reason for Consultation:     Anemia, GI bleeding         HPI:   Morgan Clay is a 74 y.o. female with a history of CHF, AICD placed, A. fib on Eliquis, history of GI bleeding in the past, readmitted to the hospital with worsening anemia overnight.  Our service was consulted to assist in management..  She was seen by her cardiology clinic yesterday, had endorsed some worsening fatigue.  She has also endorsed some darker than usual stools over the past 2 weeks or so.  She was noticed to be back in A. fib and flutter yesterday but rate controlled.  BP stable.  She has a chronic anemia with hemoglobins from 8-10's.  Yesterday her hemoglobin was 6.9.  She received 2 units of blood.  Her hemoglobin this morning is 8.9.  Her iron studies show a low iron saturation.  She has chronic kidney disease with a GFR in the mid 20s at baseline.  Her last dose of Eliquis was approximately 8 AM on 1025, just over 24 hours ago.  She denies any abdominal pains.  She denies any NSAID use or aspirin use.  Denies any overt melena or hematochezia, states stools have just been very dark brown for 2 weeks but this is a change for her.  On review of her prior work-up, she had an extensive evaluation in 2019 for bleeding.  She had a colonoscopy which showed diverticulosis and one small polyp removed.  EGD was remarkable for gastric AVM which was cauterized with APC.  She then underwent a capsule endoscopy and was found to have a mid to large AVM in the mid small bowel.  She has not had any endoscopic evaluation since that time.  She otherwise feels well today, denies any shortness of breath or chest pains.  She tolerated the transfusion well.  She does have a history of hepatitis C status post treatment with Harvoni by hepatology in the past.  She did have an ultrasound  on 10/1 with a cirrhotic appearing liver without mass lesions.  Her platelets were normal.  She appears to have compensated cirrhosis in recent years.  She is also underwent sigmoid colectomy for diverticulitis in 2020.  I reviewed her hepatology notes from September 2021, she has no history of decompensations that we are aware of.  Her LFTs have remained normal.  She has no known history of varices.  She was placed on IV Protonix drip since admission.   Past Medical History:  Diagnosis Date  . A-fib ([place])    on Eliquis  . AICD (automatic cardioverter/defibrillator) present 10/06/2015  . Anemia   . ARF (acute renal failure) ([place])   . Arthritis    "qwhere" (01/03/2018)  . Asthma    reports mild asthma since childhood - had COPD on dx list from prior PCP  . Chronic bronchitis ([place])    "get it most years; not this past year though" (01/03/2018)  . Chronic kidney disease    "? stage;  followed by [facility]; [name]" (01/03/2018)  . Chronic systolic congestive heart failure ([place])   . DEPRESSION 12/17/2009   Annotation: PHQ-9 score = 14 done on 12/17/2009 Qualifier: Diagnosis of  By: Hassell Done FNP, Tori Milks    . Diabetes mellitus without complication ([place])  DIET CONTROLLED   . Diverticulosis   . Enteritis   . FIBROIDS, UTERUS 03/05/2008  . GI bleeding 01/03/2018  . Glaucoma   . Gout   . Hepatitis C    HEPATITIS C - s/p treatment with Harvoni, saw hepatology, Dawn Drazek  . History of blood transfusion ~ 11/2017  . Hyperlipemia 12/06/2012  . HYPERTENSION, BENIGN 04/24/2007  . Hypothyroidism   . LBBB (left bundle branch block)   . Lower GI bleeding    "been dealing w/it since 07/2017" (01/03/2018)  . Nonischemic cardiomyopathy ([place])   . OBESITY 05/27/2009  . OBSTRUCTIVE SLEEP APNEA 11/14/2007   no CPAP  . OSTEOPENIA 09/30/2008  . Pain and swelling of left upper extremity 08/08/2017  . Personal history of other infectious and parasitic disease    Hepatitis B  . Pneumonia     "several times" (01/03/2018)  . Pulmonary edema, acute ([place]) 08/09/2017    Past Surgical History:  Procedure Laterality Date  . APPENDECTOMY    . CARDIAC CATHETERIZATION    . CARDIOVERSION N/A 12/14/2014   Procedure: CARDIOVERSION;  Surgeon: Dorothy Spark, MD;  Location: [facility] ENDOSCOPY;  Service: Cardiovascular;  Laterality: N/A;  . CARDIOVERSION N/A 02/26/2017   Procedure: CARDIOVERSION;  Surgeon: Evans Lance, MD;  Location: [place] CV LAB;  Service: Cardiovascular;  Laterality: N/A;  . CARDIOVERSION N/A 07/24/2017   Procedure: CARDIOVERSION;  Surgeon: Thayer Headings, MD;  Location: [facility] ENDOSCOPY;  Service: Cardiovascular;  Laterality: N/A;  . CARDIOVERSION N/A 02/12/2020   Procedure: CARDIOVERSION;  Surgeon: [name], MD;  Location: [facility] ENDOSCOPY;  Service: Cardiovascular;  Laterality: N/A;  . COLONOSCOPY N/A 11/08/2017   Procedure: COLONOSCOPY;  Surgeon: Milus Banister, MD;  Location: [facility] ENDOSCOPY;  Service: Endoscopy;  Laterality: N/A;  . DILATION AND CURETTAGE OF UTERUS    . EP IMPLANTABLE DEVICE N/A 10/06/2015   Procedure: BIV ICD Generator Changeout;  Surgeon: [name], MD;  Location: [place] CV LAB;  Service: Cardiovascular;  Laterality: N/A;  . ESOPHAGOGASTRODUODENOSCOPY N/A 10/26/2017   Procedure: ESOPHAGOGASTRODUODENOSCOPY (EGD);  Surgeon: Ladene Artist, MD;  Location: [facility] ENDOSCOPY;  Service: Endoscopy;  Laterality: N/A;  . ESOPHAGOGASTRODUODENOSCOPY (EGD) WITH PROPOFOL N/A 11/07/2017   Procedure: ESOPHAGOGASTRODUODENOSCOPY (EGD) WITH PROPOFOL;  Surgeon: Milus Banister, MD;  Location: [facility] ENDOSCOPY;  Service: Endoscopy;  Laterality: N/A;  . HOT HEMOSTASIS N/A 10/26/2017   Procedure: HOT HEMOSTASIS (ARGON PLASMA COAGULATION/BICAP);  Surgeon: Ladene Artist, MD;  Location: [facility] ENDOSCOPY;  Service: Endoscopy;  Laterality: N/A;  . INSERT / REPLACE / REMOVE PACEMAKER  ?2008  . POLYPECTOMY  11/08/2017   Procedure: POLYPECTOMY;  Surgeon: Milus Banister, MD;   Location: [facility] ENDOSCOPY;  Service: Endoscopy;;  . PROCTOSCOPY N/A 05/22/2018   Procedure: RIGID PROCTOSCOPY;  Surgeon: Michael Boston, MD;  Location: [facility] ORS;  Service: General;  Laterality: N/A;  . RIGHT HEART CATH N/A 02/13/2018   Procedure: RIGHT HEART CATH;  Surgeon: [name], MD;  Location: [place] CV LAB;  Service: Cardiovascular;  Laterality: N/A;  . RIGHT/LEFT HEART CATH AND CORONARY ANGIOGRAPHY N/A 12/11/2019   Procedure: RIGHT/LEFT HEART CATH AND CORONARY ANGIOGRAPHY;  Surgeon: [name], MD;  Location: [place] CV LAB;  Service: Cardiovascular;  Laterality: N/A;  . TUBAL LIGATION    . XI ROBOTIC ASSISTED LOWER ANTERIOR RESECTION N/A 05/22/2018   Procedure: XI ROBOTIC ASSISTED SIGMOID COLOECTOMY MOBILIZATION OF SPLENIC FLEXURE, FIREFLY ASSESSMENT OF PERFUSION;  Surgeon: Michael Boston, MD;  Location: [facility] ORS;  Service: General;  Laterality: N/A;  ERAS PATHWAY    Family History  Problem Relation Age of Onset  . Asthma Father   . Heart attack Father   . Asthma Sister   . Lung cancer Sister   . Heart attack Mother   . Stroke Brother   . Colon cancer Neg Hx   . Esophageal cancer Neg Hx   . Pancreatic cancer Neg Hx   . Stomach cancer Neg Hx   . Liver disease Neg Hx      Social History   Tobacco Use  . Smoking status: Never Smoker  . Smokeless tobacco: Never Used  Vaping Use  . Vaping Use: Never used  Substance Use Topics  . Alcohol use: Yes    Comment: 01/03/2018 "couple drinks/month"  . Drug use: Yes    Types: Marijuana    Comment: VERY INTERMITTENT     Prior to Admission medications   Medication Sig Start Date End Date Taking? Authorizing Provider  allopurinol (ZYLOPRIM) 300 MG tablet Take 300 mg by mouth daily.    Yes [provider]  amiodarone (PACERONE) 200 MG tablet Take 1 tablet (200 mg total) by mouth 2 (two) times daily. 03/01/20  Yes Lyda Jester M, PA-C  apixaban (ELIQUIS) 5 MG TABS tablet Take 1 tablet (5 mg total) by mouth 2  (two) times daily. 12/12/19  Yes [name], MD  calcitRIOL (ROCALTROL) 0.25 MCG capsule Take 0.25 mcg by mouth 3 (three) times a week. 08/19/19  Yes [provider]  carvedilol (COREG) 25 MG tablet Take 25 mg by mouth 2 (two) times daily with a meal.   Yes [provider]  colchicine 0.6 MG tablet Take 0.6 mg by mouth daily as needed (gout flares).  02/06/18  Yes [provider]  gabapentin (NEURONTIN) 300 MG capsule Take 1 capsule (300 mg total) by mouth 2 (two) times daily. 07/17/19  Yes Billie Ruddy, MD  isosorbide-hydrALAZINE (BIDIL) 20-37.5 MG tablet Take 1 tablet by mouth 3 (three) times daily. 12/05/19  Yes [name], MD  levothyroxine (SYNTHROID) 137 MCG tablet Take 137 mcg by mouth daily before breakfast.   Yes [provider]  nitroGLYCERIN (NITROSTAT) 0.4 MG SL tablet Place 0.4 mg under the tongue every 5 (five) minutes as needed for chest pain.   Yes [provider]  sertraline (ZOLOFT) 50 MG tablet Take 50 mg by mouth daily.   Yes [provider]  spironolactone (ALDACTONE) 25 MG tablet Take 0.5 tablets (12.5 mg total) by mouth daily. 12/05/19 03/04/20 Yes [name], MD  torsemide (DEMADEX) 20 MG tablet Take 2 tablets (40 mg total) by mouth daily. 03/01/20  Yes Simmons, Brittainy M, PA-C  traZODone (DESYREL) 50 MG tablet Take 50 mg by mouth at bedtime.   Yes [provider]    Current Facility-Administered Medications  Medication Dose Route Frequency Provider Last Rate Last Admin  . acetaminophen (TYLENOL) tablet 650 mg  650 mg Oral Q6H PRN Wynetta Fines T, MD       Or  . acetaminophen (TYLENOL) suppository 650 mg  650 mg Rectal Q6H PRN Wynetta Fines T, MD      . allopurinol (ZYLOPRIM) tablet 300 mg  300 mg Oral Daily Wynetta Fines T, MD   300 mg at 03/02/20 0814  . amiodarone (PACERONE) tablet 200 mg  200 mg Oral BID Wynetta Fines T, MD   200 mg at 03/02/20 0814  . colchicine tablet 0.6 mg  0.6 mg  Oral Daily  PRN Wynetta Fines T, MD      . gabapentin (NEURONTIN) capsule 300 mg  300 mg Oral BID Wynetta Fines T, MD   300 mg at 03/02/20 0813  . labetalol (NORMODYNE) injection 10 mg  10 mg Intravenous Q2H PRN Wynetta Fines T, MD      . levothyroxine (SYNTHROID) tablet 137 mcg  137 mcg Oral QAC breakfast Lequita Halt, MD   137 mcg at 03/02/20 [phone]  . ondansetron (ZOFRAN) tablet 4 mg  4 mg Oral Q6H PRN Lequita Halt, MD       Or  . ondansetron [facility]) injection 4 mg  4 mg Intravenous Q6H PRN Wynetta Fines T, MD      . pantoprazole (PROTONIX) 80 mg in sodium chloride 0.9 % 100 mL (0.8 mg/mL) infusion  8 mg/hr Intravenous Continuous Margarita Mail, PA-C 10 mL/hr at 03/01/20 2243 8 mg/hr at 03/01/20 2243  . sertraline (ZOLOFT) tablet 50 mg  50 mg Oral Daily Wynetta Fines T, MD   50 mg at 03/02/20 0813  . traZODone (DESYREL) tablet 50 mg  50 mg Oral QHS Wynetta Fines T, MD   50 mg at 03/01/20 2204    Allergies as of 03/01/2020  . (No Known Allergies)     Review of Systems:    As per HPI, otherwise negative    Physical Exam:  Vital signs in last 24 hours: Temp:  [97.7 F (36.5 C)-98.8 F (37.1 C)] 97.7 F (36.5 C) (10/26 0315) Pulse Rate:  [59-80] 67 (10/26 0315) Resp:  [7-24] 18 (10/26 0315) BP: (115-171)/(44-92) 130/80 (10/26 0315) SpO2:  [94 %-100 %] 100 % (10/26 0315) Weight:  [65.8 kg] 65.8 kg (10/25 1051) Last BM Date: 03/01/20 General:   Pleasant female in NAD Head:  Normocephalic and atraumatic. Eyes:   No icterus.   Conjunctiva pink. Ears:  Normal auditory acuity. Neck:  Supple Lungs:  Respirations even and unlabored. Lungs clear to auscultation bilaterally.   Heart:  Regular rate  Abdomen:  Soft, nondistended, nontender.  No appreciable masses Rectal:  Not performed.  Msk:  Symmetrical without gross deformities.  Extremities:  Without edema. Neurologic:  Alert and  oriented x4;  grossly normal neurologically. Skin:  Intact without significant lesions or rashes. Psych:  Alert and  cooperative. Normal affect.  LAB RESULTS: Recent Labs    03/01/20 1134 03/01/20 1456 03/02/20 0631  WBC 7.1 7.6 6.7  HGB 6.9* 7.2* 8.9*  HCT 22.1* 23.3* 27.5*  PLT 321 316 273   BMET Recent Labs    03/01/20 1134 03/01/20 1456 03/02/20 0631  NA 137 137 138  K 4.1 4.0 3.7  CL 107 105 108  CO2 20* 17* 21*  GLUCOSE 116* 138* 86  BUN 54* 53* 52*  CREATININE 2.16* 2.23* 2.17*  CALCIUM 9.5 9.6 9.2   LFT Recent Labs    03/01/20 1456  PROT 8.1  ALBUMIN 4.1  AST 23  ALT 13  ALKPHOS 85  BILITOT 0.6   PT/INR No results for input(s): LABPROT, INR in the last 72 hours.  STUDIES: DG Chest 2 View  Result Date: 03/01/2020 CLINICAL DATA:  Shortness of breath and anemia EXAM: CHEST - 2 VIEW COMPARISON:  May 20, 2018 FINDINGS: There is a minimal left pleural effusion. There is no edema or airspace opacity. There is cardiomegaly with pacemaker leads attached to right atrium, right ventricle, and coronary sinus. No adenopathy. There is aortic atherosclerosis. No bone lesions. IMPRESSION: Rather minimal left pleural effusion.  No edema or airspace opacity. Cardiomegaly with pacemaker leads attached to right atrium, right ventricle, and coronary sinus. No adenopathy. Aortic Atherosclerosis (ICD10-I70.0). Electronically Signed   By: Lowella Grip III M.D.   On: 03/01/2020 15:11   Lab Results  Component Value Date   INR 1.3 (H) 12/05/2019   INR 1.26 02/08/2018   INR 1.52 01/03/2018        Impression / Plan:   Impression: Symptomatic anemia GI bleeding History of AVMs History of hep C cirrhosis / compensated CHF / AF Anticoagulated CKD   74 year old female admitted with symptomatic anemia, dark stools, concern for GI blood loss, history of CHF and atrial fibrillation on Eliquis, history of hepatitis C with cirrhosis which is compensated.  She has responded appropriately to blood transfusion and her hemoglobin is improved today.  She otherwise states she is feeling  okay and hemodynamically stable.  Sounds like she has had some darker stools for a few weeks now, likely slow down-trend of her hemoglobin.  She had an extensive work-up in 2019 for similar issue, noted to have an AVM of the stomach which was ablated with APC, and a suspected mid small bowel AVM which was not treated at the time.  In the interim since that work-up she is undergone sigmoid colectomy for diverticulitis.  Given her prior work-up I am concerned she could be bleeding from the small bowel AVM or potentially has developed additional AVMs since she last had this evaluated.  I discussed the situation and options with her.  I think a push enteroscopy initially is reasonable to clear her upper tract and see if we can reach the AVM noted in her small intestine on capsule study; however, given the location of the AVM in the mid small bowel it may not be possible that we can reach this with standard push enteroscopy. After discussion of risks and benefits she wants to proceed with enteroscopy today initially.  Her last dose of Eliquis was more than 24 hours ago, given her GFR and will take 4 days for the Eliquis to completely get out of her system.  However if we see AVMs, we can treat with APC if needed which is considered low risk endoscopic intervention for bleeding.  Following this discussion she wants to proceed.  She is n.p.o.  Currently scheduled for around 10 AM this morning for enteroscopy.  Further recommendations pending the findings.  She agreed with the plan.  All questions answered. Continue IV PPI in the interim, she has no ascites based on [name] 10/1, do not think she needs Abx right now.   Bremen Cellar, MD [facility] Gastroenterology

## 2020-03-02 NOTE — Consult Note (Signed)
Consultation  Referring Provider:     Wynetta Fines MD Primary Care Physician:  Billie Ruddy, MD Primary Gastroenterologist:   Silvano Rusk MD      Reason for Consultation:     Anemia, GI bleeding         HPI:   Morgan Clay is a 74 y.o. female with a history of CHF, AICD placed, A. fib on Eliquis, history of GI bleeding in the past, readmitted to the hospital with worsening anemia overnight.  Our service was consulted to assist in management..  She was seen by her cardiology clinic yesterday, had endorsed some worsening fatigue.  She has also endorsed some darker than usual stools over the past 2 weeks or so.  She was noticed to be back in A. fib and flutter yesterday but rate controlled.  BP stable.  She has a chronic anemia with hemoglobins from 8-10's.  Yesterday her hemoglobin was 6.9.  She received 2 units of blood.  Her hemoglobin this morning is 8.9.  Her iron studies show a low iron saturation.  She has chronic kidney disease with a GFR in the mid 20s at baseline.  Her last dose of Eliquis was approximately 8 AM on 1025, just over 24 hours ago.  She denies any abdominal pains.  She denies any NSAID use or aspirin use.  Denies any overt melena or hematochezia, states stools have just been very dark brown for 2 weeks but this is a change for her.  On review of her prior work-up, she had an extensive evaluation in 2019 for bleeding.  She had a colonoscopy which showed diverticulosis and one small polyp removed.  EGD was remarkable for gastric AVM which was cauterized with APC.  She then underwent a capsule endoscopy and was found to have a mid to large AVM in the mid small bowel.  She has not had any endoscopic evaluation since that time.  She otherwise feels well today, denies any shortness of breath or chest pains.  She tolerated the transfusion well.  She does have a history of hepatitis C status post treatment with Harvoni by hepatology in the past.  She did have an ultrasound  on 10/1 with a cirrhotic appearing liver without mass lesions.  Her platelets were normal.  She appears to have compensated cirrhosis in recent years.  She is also underwent sigmoid colectomy for diverticulitis in 2020.  I reviewed her hepatology notes from September 2021, she has no history of decompensations that we are aware of.  Her LFTs have remained normal.  She has no known history of varices.  She was placed on IV Protonix drip since admission.   Past Medical History:  Diagnosis Date  . A-fib ([place])    on Eliquis  . AICD (automatic cardioverter/defibrillator) present 10/06/2015  . Anemia   . ARF (acute renal failure) ([place])   . Arthritis    "qwhere" (01/03/2018)  . Asthma    reports mild asthma since childhood - had COPD on dx list from prior PCP  . Chronic bronchitis ([place])    "get it most years; not this past year though" (01/03/2018)  . Chronic kidney disease    "? stage;  followed by [facility]; [name]" (01/03/2018)  . Chronic systolic congestive heart failure ([place])   . DEPRESSION 12/17/2009   Annotation: PHQ-9 score = 14 done on 12/17/2009 Qualifier: Diagnosis of  By: Hassell Done FNP, Tori Milks    . Diabetes mellitus without complication ([place])  DIET CONTROLLED   . Diverticulosis   . Enteritis   . FIBROIDS, UTERUS 03/05/2008  . GI bleeding 01/03/2018  . Glaucoma   . Gout   . Hepatitis C    HEPATITIS C - s/p treatment with Harvoni, saw hepatology, Dawn Drazek  . History of blood transfusion ~ 11/2017  . Hyperlipemia 12/06/2012  . HYPERTENSION, BENIGN 04/24/2007  . Hypothyroidism   . LBBB (left bundle branch block)   . Lower GI bleeding    "been dealing w/it since 07/2017" (01/03/2018)  . Nonischemic cardiomyopathy ([place])   . OBESITY 05/27/2009  . OBSTRUCTIVE SLEEP APNEA 11/14/2007   no CPAP  . OSTEOPENIA 09/30/2008  . Pain and swelling of left upper extremity 08/08/2017  . Personal history of other infectious and parasitic disease    Hepatitis B  . Pneumonia     "several times" (01/03/2018)  . Pulmonary edema, acute ([place]) 08/09/2017    Past Surgical History:  Procedure Laterality Date  . APPENDECTOMY    . CARDIAC CATHETERIZATION    . CARDIOVERSION N/A 12/14/2014   Procedure: CARDIOVERSION;  Surgeon: Dorothy Spark, MD;  Location: [facility] ENDOSCOPY;  Service: Cardiovascular;  Laterality: N/A;  . CARDIOVERSION N/A 02/26/2017   Procedure: CARDIOVERSION;  Surgeon: Evans Lance, MD;  Location: [place] CV LAB;  Service: Cardiovascular;  Laterality: N/A;  . CARDIOVERSION N/A 07/24/2017   Procedure: CARDIOVERSION;  Surgeon: Thayer Headings, MD;  Location: [facility] ENDOSCOPY;  Service: Cardiovascular;  Laterality: N/A;  . CARDIOVERSION N/A 02/12/2020   Procedure: CARDIOVERSION;  Surgeon: [name], MD;  Location[facility] ENDOSCOPY;  Service: Cardiovascular;  Laterality: N/A;  . COLONOSCOPY N/A 11/08/2017   Procedure: COLONOSCOPY;  Surgeon: Milus Banister, MD;  Location: [facility] ENDOSCOPY;  Service: Endoscopy;  Laterality: N/A;  . DILATION AND CURETTAGE OF UTERUS    . EP IMPLANTABLE DEVICE N/A 10/06/2015   Procedure: BIV ICD Generator Changeout;  Surgeon: [name], MD;  Location: [place] CV LAB;  Service: Cardiovascular;  Laterality: N/A;  . ESOPHAGOGASTRODUODENOSCOPY N/A 10/26/2017   Procedure: ESOPHAGOGASTRODUODENOSCOPY (EGD);  Surgeon: Ladene Artist, MD;  Location: [facility] ENDOSCOPY;  Service: Endoscopy;  Laterality: N/A;  . ESOPHAGOGASTRODUODENOSCOPY (EGD) WITH PROPOFOL N/A 11/07/2017   Procedure: ESOPHAGOGASTRODUODENOSCOPY (EGD) WITH PROPOFOL;  Surgeon: Milus Banister, MD;  Location: [facility] ENDOSCOPY;  Service: Endoscopy;  Laterality: N/A;  . HOT HEMOSTASIS N/A 10/26/2017   Procedure: HOT HEMOSTASIS (ARGON PLASMA COAGULATION/BICAP);  Surgeon: Ladene Artist, MD;  Location: [facility] ENDOSCOPY;  Service: Endoscopy;  Laterality: N/A;  . INSERT / REPLACE / REMOVE PACEMAKER  ?2008  . POLYPECTOMY  11/08/2017   Procedure: POLYPECTOMY;  Surgeon: Milus Banister, MD;   Location: [facility] ENDOSCOPY;  Service: Endoscopy;;  . PROCTOSCOPY N/A 05/22/2018   Procedure: RIGID PROCTOSCOPY;  Surgeon: Michael Boston, MD;  Location: [facility] ORS;  Service: General;  Laterality: N/A;  . RIGHT HEART CATH N/A 02/13/2018   Procedure: RIGHT HEART CATH;  Surgeon: [name], MD;  Location: [place] CV LAB;  Service: Cardiovascular;  Laterality: N/A;  . RIGHT/LEFT HEART CATH AND CORONARY ANGIOGRAPHY N/A 12/11/2019   Procedure: RIGHT/LEFT HEART CATH AND CORONARY ANGIOGRAPHY;  Surgeon: [name], MD;  Location: [place] CV LAB;  Service: Cardiovascular;  Laterality: N/A;  . TUBAL LIGATION    . XI ROBOTIC ASSISTED LOWER ANTERIOR RESECTION N/A 05/22/2018   Procedure: XI ROBOTIC ASSISTED SIGMOID COLOECTOMY MOBILIZATION OF SPLENIC FLEXURE, FIREFLY ASSESSMENT OF PERFUSION;  Surgeon: Michael Boston, MD;  Location: [facility] ORS;  Service: General;  Laterality: N/A;  ERAS PATHWAY    Family History  Problem Relation Age of Onset  . Asthma Father   . Heart attack Father   . Asthma Sister   . Lung cancer Sister   . Heart attack Mother   . Stroke Brother   . Colon cancer Neg Hx   . Esophageal cancer Neg Hx   . Pancreatic cancer Neg Hx   . Stomach cancer Neg Hx   . Liver disease Neg Hx      Social History   Tobacco Use  . Smoking status: Never Smoker  . Smokeless tobacco: Never Used  Vaping Use  . Vaping Use: Never used  Substance Use Topics  . Alcohol use: Yes    Comment: 01/03/2018 "couple drinks/month"  . Drug use: Yes    Types: Marijuana    Comment: VERY INTERMITTENT     Prior to Admission medications   Medication Sig Start Date End Date Taking? Authorizing Provider  allopurinol (ZYLOPRIM) 300 MG tablet Take 300 mg by mouth daily.    Yes [provider]  amiodarone (PACERONE) 200 MG tablet Take 1 tablet (200 mg total) by mouth 2 (two) times daily. 03/01/20  Yes Lyda Jester M, PA-C  apixaban (ELIQUIS) 5 MG TABS tablet Take 1 tablet (5 mg total) by mouth 2  (two) times daily. 12/12/19  Yes [name], MD  calcitRIOL (ROCALTROL) 0.25 MCG capsule Take 0.25 mcg by mouth 3 (three) times a week. 08/19/19  Yes [provider]  carvedilol (COREG) 25 MG tablet Take 25 mg by mouth 2 (two) times daily with a meal.   Yes [provider]  colchicine 0.6 MG tablet Take 0.6 mg by mouth daily as needed (gout flares).  02/06/18  Yes [provider]  gabapentin (NEURONTIN) 300 MG capsule Take 1 capsule (300 mg total) by mouth 2 (two) times daily. 07/17/19  Yes Billie Ruddy, MD  isosorbide-hydrALAZINE (BIDIL) 20-37.5 MG tablet Take 1 tablet by mouth 3 (three) times daily. 12/05/19  Yes [name], MD  levothyroxine (SYNTHROID) 137 MCG tablet Take 137 mcg by mouth daily before breakfast.   Yes [provider]  nitroGLYCERIN (NITROSTAT) 0.4 MG SL tablet Place 0.4 mg under the tongue every 5 (five) minutes as needed for chest pain.   Yes [provider]  sertraline (ZOLOFT) 50 MG tablet Take 50 mg by mouth daily.   Yes [provider]  spironolactone (ALDACTONE) 25 MG tablet Take 0.5 tablets (12.5 mg total) by mouth daily. 12/05/19 03/04/20 Yes [name], MD  torsemide (DEMADEX) 20 MG tablet Take 2 tablets (40 mg total) by mouth daily. 03/01/20  Yes Simmons, Brittainy M, PA-C  traZODone (DESYREL) 50 MG tablet Take 50 mg by mouth at bedtime.   Yes [provider]    Current Facility-Administered Medications  Medication Dose Route Frequency Provider Last Rate Last Admin  . acetaminophen (TYLENOL) tablet 650 mg  650 mg Oral Q6H PRN Wynetta Fines T, MD       Or  . acetaminophen (TYLENOL) suppository 650 mg  650 mg Rectal Q6H PRN Wynetta Fines T, MD      . allopurinol (ZYLOPRIM) tablet 300 mg  300 mg Oral Daily Wynetta Fines T, MD   300 mg at 03/02/20 0814  . amiodarone (PACERONE) tablet 200 mg  200 mg Oral BID Wynetta Fines T, MD   200 mg at 03/02/20 0814  . colchicine tablet 0.6 mg  0.6 mg  Oral Daily  PRN Wynetta Fines T, MD      . gabapentin (NEURONTIN) capsule 300 mg  300 mg Oral BID Wynetta Fines T, MD   300 mg at 03/02/20 0813  . labetalol (NORMODYNE) injection 10 mg  10 mg Intravenous Q2H PRN Wynetta Fines T, MD      . levothyroxine (SYNTHROID) tablet 137 mcg  137 mcg Oral QAC breakfast Lequita Halt, MD   137 mcg at 03/02/20 [phone]  . ondansetron (ZOFRAN) tablet 4 mg  4 mg Oral Q6H PRN Lequita Halt, MD       Or  . ondansetron [facility]) injection 4 mg  4 mg Intravenous Q6H PRN Wynetta Fines T, MD      . pantoprazole (PROTONIX) 80 mg in sodium chloride 0.9 % 100 mL (0.8 mg/mL) infusion  8 mg/hr Intravenous Continuous Margarita Mail, PA-C 10 mL/hr at 03/01/20 2243 8 mg/hr at 03/01/20 2243  . sertraline (ZOLOFT) tablet 50 mg  50 mg Oral Daily Wynetta Fines T, MD   50 mg at 03/02/20 0813  . traZODone (DESYREL) tablet 50 mg  50 mg Oral QHS Wynetta Fines T, MD   50 mg at 03/01/20 2204    Allergies as of 03/01/2020  . (No Known Allergies)     Review of Systems:    As per HPI, otherwise negative    Physical Exam:  Vital signs in last 24 hours: Temp:  [97.7 F (36.5 C)-98.8 F (37.1 C)] 97.7 F (36.5 C) (10/26 0315) Pulse Rate:  [59-80] 67 (10/26 0315) Resp:  [7-24] 18 (10/26 0315) BP: (115-171)/(44-92) 130/80 (10/26 0315) SpO2:  [94 %-100 %] 100 % (10/26 0315) Weight:  [65.8 kg] 65.8 kg (10/25 1051) Last BM Date: 03/01/20 General:   Pleasant female in NAD Head:  Normocephalic and atraumatic. Eyes:   No icterus.   Conjunctiva pink. Ears:  Normal auditory acuity. Neck:  Supple Lungs:  Respirations even and unlabored. Lungs clear to auscultation bilaterally.   Heart:  Regular rate  Abdomen:  Soft, nondistended, nontender.  No appreciable masses Rectal:  Not performed.  Msk:  Symmetrical without gross deformities.  Extremities:  Without edema. Neurologic:  Alert and  oriented x4;  grossly normal neurologically. Skin:  Intact without significant lesions or rashes. Psych:  Alert and  cooperative. Normal affect.  LAB RESULTS: Recent Labs    03/01/20 1134 03/01/20 1456 03/02/20 0631  WBC 7.1 7.6 6.7  HGB 6.9* 7.2* 8.9*  HCT 22.1* 23.3* 27.5*  PLT 321 316 273   BMET Recent Labs    03/01/20 1134 03/01/20 1456 03/02/20 0631  NA 137 137 138  K 4.1 4.0 3.7  CL 107 105 108  CO2 20* 17* 21*  GLUCOSE 116* 138* 86  BUN 54* 53* 52*  CREATININE 2.16* 2.23* 2.17*  CALCIUM 9.5 9.6 9.2   LFT Recent Labs    03/01/20 1456  PROT 8.1  ALBUMIN 4.1  AST 23  ALT 13  ALKPHOS 85  BILITOT 0.6   PT/INR No results for input(s): LABPROT, INR in the last 72 hours.  STUDIES: DG Chest 2 View  Result Date: 03/01/2020 CLINICAL DATA:  Shortness of breath and anemia EXAM: CHEST - 2 VIEW COMPARISON:  May 20, 2018 FINDINGS: There is a minimal left pleural effusion. There is no edema or airspace opacity. There is cardiomegaly with pacemaker leads attached to right atrium, right ventricle, and coronary sinus. No adenopathy. There is aortic atherosclerosis. No bone lesions. IMPRESSION: Rather minimal left pleural effusion.  No edema or airspace opacity. Cardiomegaly with pacemaker leads attached to right atrium, right ventricle, and coronary sinus. No adenopathy. Aortic Atherosclerosis (ICD10-I70.0). Electronically Signed   By: Lowella Grip III M.D.   On: 03/01/2020 15:11   Lab Results  Component Value Date   INR 1.3 (H) 12/05/2019   INR 1.26 02/08/2018   INR 1.52 01/03/2018        Impression / Plan:   Impression: Symptomatic anemia GI bleeding History of AVMs History of hep C cirrhosis / compensated CHF / AF Anticoagulated CKD   74 year old female admitted with symptomatic anemia, dark stools, concern for GI blood loss, history of CHF and atrial fibrillation on Eliquis, history of hepatitis C with cirrhosis which is compensated.  She has responded appropriately to blood transfusion and her hemoglobin is improved today.  She otherwise states she is feeling  okay and hemodynamically stable.  Sounds like she has had some darker stools for a few weeks now, likely slow down-trend of her hemoglobin.  She had an extensive work-up in 2019 for similar issue, noted to have an AVM of the stomach which was ablated with APC, and a suspected mid small bowel AVM which was not treated at the time.  In the interim since that work-up she is undergone sigmoid colectomy for diverticulitis.  Given her prior work-up I am concerned she could be bleeding from the small bowel AVM or potentially has developed additional AVMs since she last had this evaluated.  I discussed the situation and options with her.  I think a push enteroscopy initially is reasonable to clear her upper tract and see if we can reach the AVM noted in her small intestine on capsule study; however, given the location of the AVM in the mid small bowel it may not be possible that we can reach this with standard push enteroscopy. After discussion of risks and benefits she wants to proceed with enteroscopy today initially.  Her last dose of Eliquis was more than 24 hours ago, given her GFR and will take 4 days for the Eliquis to completely get out of her system.  However if we see AVMs, we can treat with APC if needed which is considered low risk endoscopic intervention for bleeding.  Following this discussion she wants to proceed.  She is n.p.o.  Currently scheduled for around 10 AM this morning for enteroscopy.  Further recommendations pending the findings.  She agreed with the plan.  All questions answered. Continue IV PPI in the interim, she has no ascites based on [name] 10/1, do not think she needs Abx right now.   [place] Cellar, MD [facility]astro[facility]

## 2020-03-02 NOTE — Interval H&P Note (Signed)
History and Physical Interval Note:  03/02/2020 9:54 AM  Morgan Clay  has presented today for surgery, with the diagnosis of anemia, gi bleeding.  The various methods of treatment have been discussed with the patient and family. After consideration of risks, benefits and other options for treatment, the patient has consented to  Procedure(s): ENTEROSCOPY (N/A) as a surgical intervention.  The patient's history has been reviewed, patient examined, no change in status, stable for surgery.  I have reviewed the patient's chart and labs.  Questions were answered to the patient's satisfaction.     [place]

## 2020-03-02 NOTE — Progress Notes (Signed)
PROGRESS NOTE  Morgan Clay OYD:[id number] DOB: 1945-12-22 DOA: 03/01/2020 PCP: Billie Ruddy, MD  HPI/Recap of past 24 hours: Patient is a 74 year old female past medical history of atrial fibrillation on Eliquis, chronic diastolic heart failure and stage IV chronic kidney disease who presented to the emergency room on 10/25 after being sent over by her cardiologist with complaints of shortness of breath which has been progressively worsening over the past few weeks and she also noted a dark color to her stool in the past week or so, but did not note any bright red blood in her stool. Patient has chronic atrial fibrillation and she had underwent cardioversion on 10/7, and when she saw them in follow-up on 10/7, her hemoglobin was noted to be 6.9 as compared to 10.22 weeks prior. Patient was admitted to the hospitalist service for GI bleed. She was transfused 2 units packed red blood cells and GI was consulted.  GI saw patient and took her for endoscopy on 10/26 morning and no active bleeding was noted, but she was noted to have some prepyloric gastric AVMs in the distal small bowel AVM which could be a source of her bleeding. Patient seen post procedure and is still somewhat somnolent  Assessment/Plan: Principal Problem:   GI bleed causing symptomatic anemia, likely secondary to AVM of small bowel/stomach: Appreciate GI help.  Continue to monitor closely.  At this time, appears to be stable. Active Problems:   Chronic diastolic CHF (congestive heart failure) ([place]): Given rise in blood pressure following blood administration, suspect she may have some mild volume overload.  We will give some Lasix with this.   CKD (chronic kidney disease) stage 4, GFR 15-29 ml/min: Creatinine 2.17 with GFR of 24.  Looks to be at baseline.      Atrial fibrillation, chronic on anticoagulation: Eliquis on hold.  Will discuss with GI about if/when she can restart this.    Cirrhosis of liver without ascites  [facility]): Being followed as outpatient   Overweight (BMI 25.0-29.9): Meets criteria for BMI greater than 25  History of hepatitis C: Status post treatment  Code Status: Full code  Family Communication: Left message for family  Disposition Plan: Potential discharge in next 1 to 2 days once cleared by GI   Consultants:  Gastroenterology  Procedures:  Status post endoscopy done 10/26  Status post 2 units packed red cell transfusion 10/25  Antimicrobials:  None  DVT prophylaxis: SCDs   Objective: Vitals:   03/02/20 1056 03/02/20 1106  BP: (!) 129/29 (!) 156/42  Pulse: (!) 59 (!) 59  Resp: 19 18  Temp:    SpO2: 100% 99%    Intake/Output Summary (Last 24 hours) at 03/02/2020 1838 Last data filed at 03/02/2020 1037 Gross per 24 hour  Intake 1054.35 ml  Output --  Net 1054.35 ml   There were no vitals filed for this visit. There is no height or weight on file to calculate BMI.  Exam:   General: Slightly somnolent, no acute distress  HEENT normocephalic and atraumatic, mucous membranes are moist  Cardiovascular: Irregular rhythm, rate controlled  Respiratory: Clear to auscultation bilaterally  Abdomen: Soft, nontender, nondistended, positive bowel sounds  Musculoskeletal: No clubbing or cyanosis, trace pitting edema  Skin: No skin breaks, tears or lesions  Psychiatry: Appropriate, no evidence of psychoses   Data Reviewed: CBC: Recent Labs  Lab 03/01/20 1134 03/01/20 1456 03/02/20 0631  WBC 7.1 7.6 6.7  HGB 6.9* 7.2* 8.9*  HCT 22.1* 23.3* 27.5*  MCV 88.8 88.6 84.9  PLT 321 316 400   Basic Metabolic Panel: Recent Labs  Lab 03/01/20 1134 03/01/20 1456 03/02/20 0631  NA 137 137 138  K 4.1 4.0 3.7  CL 107 105 108  CO2 20* 17* 21*  GLUCOSE 116* 138* 86  BUN 54* 53* 52*  CREATININE 2.16* 2.23* 2.17*  CALCIUM 9.5 9.6 9.2   GFR: Estimated Creatinine Clearance: 19.3 mL/min (A) (by C-G formula based on SCr of 2.17 mg/dL (H)). Liver Function  Tests: Recent Labs  Lab 03/01/20 1456  AST 23  ALT 13  ALKPHOS 85  BILITOT 0.6  PROT 8.1  ALBUMIN 4.1   No results for input(s): LIPASE, AMYLASE in the last 168 hours. No results for input(s): AMMONIA in the last 168 hours. Coagulation Profile: No results for input(s): INR, PROTIME in the last 168 hours. Cardiac Enzymes: No results for input(s): CKTOTAL, CKMB, CKMBINDEX, TROPONINI in the last 168 hours. BNP (last 3 results) Recent Labs    07/17/19 1352 07/23/19 1033  PROBNP 811.0* 637.0*   HbA1C: No results for input(s): HGBA1C in the last 72 hours. CBG: No results for input(s): GLUCAP in the last 168 hours. Lipid Profile: No results for input(s): CHOL, HDL, LDLCALC, TRIG, CHOLHDL, LDLDIRECT in the last 72 hours. Thyroid Function Tests: No results for input(s): TSH, T4TOTAL, FREET4, T3FREE, THYROIDAB in the last 72 hours. Anemia Panel: Recent Labs    03/02/20 0220 03/02/20 0631  TIBC  --  505*  IRON  --  34  RETICCTPCT 2.6  --    Urine analysis:    Component Value Date/Time   COLORURINE YELLOW 03/05/2018 0902   APPEARANCEUR CLEAR 03/05/2018 0902   LABSPEC 1.008 03/05/2018 0902   PHURINE 5.0 03/05/2018 0902   GLUCOSEU NEGATIVE 03/05/2018 0902   HGBUR NEGATIVE 03/05/2018 0902   HGBUR negative 02/28/2008 0000   BILIRUBINUR NEGATIVE 03/05/2018 0902   KETONESUR 5 (A) 03/05/2018 0902   PROTEINUR NEGATIVE 03/05/2018 0902   UROBILINOGEN 1.0 07/19/2010 1752   NITRITE NEGATIVE 03/05/2018 0902   LEUKOCYTESUR NEGATIVE 03/05/2018 0902   Sepsis Labs: @LABRCNTIP (procalcitonin:4,lacticidven:4)  ) Recent Results (from the past 240 hour(s))  Resp Panel by RT PCR (RSV, Flu A&B, Covid) - Nasopharyngeal Swab     Status: None   Collection Time: 03/01/20 10:46 PM   Specimen: Nasopharyngeal Swab  Result Value Ref Range Status   SARS Coronavirus 2 by RT PCR NEGATIVE NEGATIVE Final    Comment: (NOTE) SARS-CoV-2 target nucleic acids are NOT DETECTED.  The SARS-CoV-2 RNA is  generally detectable in upper respiratoy specimens during the acute phase of infection. The lowest concentration of SARS-CoV-2 viral copies this assay can detect is 131 copies/mL. A negative result does not preclude SARS-Cov-2 infection and should not be used as the sole basis for treatment or other patient management decisions. A negative result may occur with  improper specimen collection/handling, submission of specimen other than nasopharyngeal swab, presence of viral mutation(s) within the areas targeted by this assay, and inadequate number of viral copies (<131 copies/mL). A negative result must be combined with clinical observations, patient history, and epidemiological information. The expected result is Negative.  Fact Sheet for Patients:  PinkCheek.be  Fact Sheet for Healthcare Providers:  GravelBags.it  This test is no t yet approved or cleared by the [place] FDA and  has been authorized for detection and/or diagnosis of SARS-CoV-2 by FDA under an Emergency Use Authorization (EUA). This EUA will remain  in effect (meaning this test can  be used) for the duration of the COVID-19 declaration under Section 564(b)(1) of the Act, 21 U.S.C. section 360bbb-3(b)(1), unless the authorization is terminated or revoked sooner.     Influenza A by PCR NEGATIVE NEGATIVE Final   Influenza B by PCR NEGATIVE NEGATIVE Final    Comment: (NOTE) The Xpert Xpress SARS-CoV-2/FLU/RSV assay is intended as an aid in  the diagnosis of influenza from Nasopharyngeal swab specimens and  should not be used as a sole basis for treatment. Nasal washings and  aspirates are unacceptable for Xpert Xpress SARS-CoV-2/FLU/RSV  testing.  Fact Sheet for Patients: PinkCheek.be  Fact Sheet for Healthcare Providers: GravelBags.it  This test is not yet approved or cleared by the [place] FDA and  has been authorized for detection and/or diagnosis of SARS-CoV-2 by  FDA under an Emergency Use Authorization (EUA). This EUA will remain  in effect (meaning this test can be used) for the duration of the  Covid-19 declaration under Section 564(b)(1) of the Act, 21  U.S.C. section 360bbb-3(b)(1), unless the authorization is  terminated or revoked.    Respiratory Syncytial Virus by PCR NEGATIVE NEGATIVE Final    Comment: (NOTE) Fact Sheet for Patients: PinkCheek.be  Fact Sheet for Healthcare Providers: GravelBags.it  This test is not yet approved or cleared by the [place] FDA and  has been authorized for detection and/or diagnosis of SARS-CoV-2 by  FDA under an Emergency Use Authorization (EUA). This EUA will remain  in effect (meaning this test can be used) for the duration of the  COVID-19 declaration under Section 564(b)(1) of the Act, 21 U.S.C.  section 360bbb-3(b)(1), unless the authorization is terminated or  revoked. Performed at [facility], [place] [address], [place], [place] [address]       Studies: No results found.  Scheduled Meds: . allopurinol  300 mg Oral Daily  . amiodarone  200 mg Oral BID  . gabapentin  300 mg Oral BID  . levothyroxine  137 mcg Oral QAC breakfast  . sertraline  50 mg Oral Daily  . traZODone  50 mg Oral QHS    Continuous Infusions: . pantoprozole (PROTONIX) infusion 8 mg/hr (03/01/20 2243)     LOS: 1 day     Annita Brod, MD [facility]   03/02/2020, 6:38 PM

## 2020-03-02 NOTE — Plan of Care (Signed)

## 2020-03-02 NOTE — Plan of Care (Signed)
  Problem: Clinical Measurements: Goal: Ability to maintain clinical measurements within normal limits will improve Outcome: Progressing Goal: Will remain free from infection Outcome: Progressing Goal: Diagnostic test results will improve Outcome: Progressing Goal: Respiratory complications will improve Outcome: Progressing Goal: Cardiovascular complication will be avoided Outcome: Progressing   Problem: Skin Integrity: Goal: Risk for impaired skin integrity will decrease Outcome: Progressing   Problem: Safety: Goal: Ability to remain free from injury will improve Outcome: Progressing   Problem: Pain Managment: Goal: General experience of comfort will improve Outcome: Progressing   Problem: Elimination: Goal: Will not experience complications related to bowel motility Outcome: Progressing Goal: Will not experience complications related to urinary retention Outcome: Progressing

## 2020-03-02 NOTE — Op Note (Addendum)
[facility] Patient Name: Morgan Clay Procedure Date : 03/02/2020 MRN: [id number] Attending MD: Carlota Raspberry. Havery Moros , MD Date of Birth: 10/20/1945 CSN: [id number] Age: 74 Admit Type: Inpatient Procedure:                Small bowel enteroscopy Indications:              Suspected upper tract gastrointestinal bleeding,                            anemia, history of compensated cirrhosis Providers:                Remo Lipps P. Havery Moros, MD, Clyde Lundborg, RN,                            Laverda Sorenson, Technician, Raphael Gibney, CRNA Referring MD:              Medicines:                Monitored Anesthesia Care Complications:            No immediate complications. Estimated blood loss:                            Minimal. Estimated Blood Loss:     Estimated blood loss was minimal. Procedure:                Pre-Anesthesia Assessment:                           - Prior to the procedure, a History and Physical                            was performed, and patient medications and                            allergies were reviewed. The patient's tolerance of                            previous anesthesia was also reviewed. The risks                            and benefits of the procedure and the sedation                            options and risks were discussed with the patient.                            All questions were answered, and informed consent                            was obtained. Prior Anticoagulants: The patient has                            taken Eliquis (apixaban), last dose was 1 day prior  to procedure. ASA Grade Assessment: III - A patient                            with severe systemic disease. After reviewing the                            risks and benefits, the patient was deemed in                            satisfactory condition to undergo the procedure.                           After obtaining informed consent, the  endoscope was                            passed under direct vision. Throughout the                            procedure, the patient's blood pressure, pulse, and                            oxygen saturations were monitored continuously. The                            [id number]-[id number] ([id number]) Olympus pediatric colonoscope                            was introduced through the mouth and advanced to                            the proximal jejunum. The small bowel enteroscopy                            was accomplished without difficulty. The patient                            tolerated the procedure well. Scope In: Scope Out: Findings:      Esophagogastric landmarks were identified: the Z-line was found at 37       cm, the gastroesophageal junction was found at 37 cm and the upper       extent of the gastric folds was found at 37 cm from the incisors.      One trace column of a varix were found in the lower third of the       esophagus. No stigmata for bleeding noted.      The exam of the esophagus was otherwise normal.      A small area of a superficial angiodysplastic lesion with no bleeding       was found in the prepyloric region of the stomach, along with a few       punctate areas of suspected small superficial vascular lesions in close       proximity. Fulguration to ablate the lesions to prevent bleeding by       argon plasma was successful.      The exam of the stomach was otherwise  normal.      There was a diminutive polyp in the duodenal sweep which was benign       appearing and not removed given recent anticoagulation and the patient's       bleeding. The ampulla was prominent but not completely visualized on       this exam. The was no evidence of significant pathology in the entire       examined duodenum otherwise, no heme.      There was no evidence of significant pathology in the examined jejunum       which felt to be intubated as far as it could be reached via the        enteroscope. The distal area evaluatated was tattooed with an injection       of Spot (carbon black). Impression:               - Esophagogastric landmarks identified.                           - Trace column of esophageal varix.                           - Normal esophagus otherwise.                           - Small superficial AVMs in the pre-pyloric stomach                            with other punctate vascular lesions. Treated with                            argon plasma coagulation (APC).                           - Normal stomach otherwise. There was some scant                            superficial endoscopic trauma to the gastric body                            upon withdrawing the enteroscope, as well as                            superifical small mucosal disruption of the                            proximal esophagus from the enteroscope.                           - Benign appearing small duodenal polyp, not                            removed given anticoagulated status and recent                            bleeding                           -  Prominent ampulla incompletely evaluated without                            side viewing endoscope                           - Normal examined duodenum otherwise                           - The examined portion of the jejunum was normal.                            Tattooed distal most extent - AVM noted on prior                            capsule study.                           Possible gastric findings could have contributed to                            anemia which were treated, although she has a more                            distal small bowel AVM which could also be source Recommendation:           - Return patient to hospital ward for ongoing care.                           - Clear liquid diet okay today                           - Continue present medications                           - Continue to hold Eliquis for now while  we monitor                            her course                           - Trend Hgb, monitor for recurrent bleeding                           - If patient rebleeding consider repeat capsule                            endoscopy given prior small bowel AVM noted in 2019                            to clarify it's status and assess AVM burden vs.                            proceeding directly with balloon endoscopy if  available                           - GI service will re-evaluate the patient tomorrow,                            call with questions in the interim Procedure Code(s):        --- Professional ---                           [id number], Small intestinal endoscopy, enteroscopy                            beyond second portion of duodenum, not including                            ileum; with control of bleeding (eg, injection,                            bipolar cautery, unipolar cautery, laser, heater                            probe, stapler, plasma coagulator)                           44799, Unlisted procedure, small intestine Diagnosis Code(s):        --- Professional ---                           I85.00, Esophageal varices without bleeding                           K31.819, Angiodysplasia of stomach and duodenum                            without bleeding                           K92.2, Gastrointestinal hemorrhage, unspecified CPT copyright 2019 American Medical Association. All rights reserved. The codes documented in this report are preliminary and upon coder review may  be revised to meet current compliance requirements. Remo Lipps P. Dilia Alemany, MD 03/02/2020 11:06:39 AM This report has been signed electronically. Number of Addenda: 0

## 2020-03-03 ENCOUNTER — Encounter (HOSPITAL_COMMUNITY): Payer: Self-pay | Admitting: Gastroenterology

## 2020-03-03 DIAGNOSIS — K5520 Angiodysplasia of colon without hemorrhage: Secondary | ICD-10-CM

## 2020-03-03 DIAGNOSIS — I482 Chronic atrial fibrillation, unspecified: Secondary | ICD-10-CM

## 2020-03-03 DIAGNOSIS — K922 Gastrointestinal hemorrhage, unspecified: Secondary | ICD-10-CM | POA: Diagnosis not present

## 2020-03-03 DIAGNOSIS — Z7901 Long term (current) use of anticoagulants: Secondary | ICD-10-CM | POA: Diagnosis not present

## 2020-03-03 DIAGNOSIS — K31819 Angiodysplasia of stomach and duodenum without bleeding: Secondary | ICD-10-CM

## 2020-03-03 DIAGNOSIS — E663 Overweight: Secondary | ICD-10-CM

## 2020-03-03 DIAGNOSIS — K7460 Unspecified cirrhosis of liver: Secondary | ICD-10-CM

## 2020-03-03 DIAGNOSIS — I5032 Chronic diastolic (congestive) heart failure: Secondary | ICD-10-CM

## 2020-03-03 DIAGNOSIS — K31811 Angiodysplasia of stomach and duodenum with bleeding: Secondary | ICD-10-CM | POA: Diagnosis not present

## 2020-03-03 DIAGNOSIS — N184 Chronic kidney disease, stage 4 (severe): Secondary | ICD-10-CM

## 2020-03-03 DIAGNOSIS — D649 Anemia, unspecified: Secondary | ICD-10-CM

## 2020-03-03 LAB — CBC
HCT: 27.7 % — ABNORMAL LOW (ref 36.0–46.0)
Hemoglobin: 8.9 g/dL — ABNORMAL LOW (ref 12.0–15.0)
MCH: 27.2 pg (ref 26.0–34.0)
MCHC: 32.1 g/dL (ref 30.0–36.0)
MCV: 84.7 fL (ref 80.0–100.0)
Platelets: 276 10*3/uL (ref 150–400)
RBC: 3.27 MIL/uL — ABNORMAL LOW (ref 3.87–5.11)
RDW: 19.8 % — ABNORMAL HIGH (ref 11.5–15.5)
WBC: 6.7 10*3/uL (ref 4.0–10.5)
nRBC: 0.3 % — ABNORMAL HIGH (ref 0.0–0.2)

## 2020-03-03 LAB — BASIC METABOLIC PANEL
Anion gap: 11 (ref 5–15)
BUN: 46 mg/dL — ABNORMAL HIGH (ref 8–23)
CO2: 19 mmol/L — ABNORMAL LOW (ref 22–32)
Calcium: 9.2 mg/dL (ref 8.9–10.3)
Chloride: 106 mmol/L (ref 98–111)
Creatinine, Ser: 2.18 mg/dL — ABNORMAL HIGH (ref 0.44–1.00)
GFR, Estimated: 23 mL/min — ABNORMAL LOW (ref >60)
Glucose, Bld: 79 mg/dL (ref 70–99)
Potassium: 3.8 mmol/L (ref 3.5–5.1)
Sodium: 136 mmol/L (ref 135–145)

## 2020-03-03 MED ORDER — APIXABAN 5 MG PO TABS
5.0000 mg | ORAL_TABLET | Freq: Two times a day (BID) | ORAL | Status: DC
  Administered 2020-03-03 – 2020-03-04 (×2): 5 mg via ORAL
  Filled 2020-03-03 (×2): qty 1

## 2020-03-03 MED ORDER — TORSEMIDE 20 MG PO TABS
40.0000 mg | ORAL_TABLET | Freq: Every day | ORAL | Status: DC
  Administered 2020-03-04: 40 mg via ORAL
  Filled 2020-03-03 (×2): qty 2

## 2020-03-03 MED ORDER — PANTOPRAZOLE SODIUM 40 MG PO TBEC
40.0000 mg | DELAYED_RELEASE_TABLET | Freq: Every day | ORAL | Status: DC
  Administered 2020-03-04: 40 mg via ORAL
  Filled 2020-03-03: qty 1

## 2020-03-03 MED ORDER — SODIUM CHLORIDE 0.9 % IV SOLN
510.0000 mg | Freq: Once | INTRAVENOUS | Status: AC
  Administered 2020-03-03: 510 mg via INTRAVENOUS
  Filled 2020-03-03: qty 17

## 2020-03-03 MED ORDER — CARVEDILOL 25 MG PO TABS
25.0000 mg | ORAL_TABLET | Freq: Two times a day (BID) | ORAL | Status: DC
  Administered 2020-03-03 – 2020-03-04 (×2): 25 mg via ORAL
  Filled 2020-03-03 (×2): qty 1

## 2020-03-03 NOTE — Anesthesia Postprocedure Evaluation (Signed)
Anesthesia Post Note  Patient: Morgan Clay  Procedure(s) Performed: ENTEROSCOPY (N/A ) SUBMUCOSAL TATTOO INJECTION HOT HEMOSTASIS (ARGON PLASMA COAGULATION/BICAP) (N/A )     Patient location during evaluation: Endoscopy Anesthesia Type: MAC Level of consciousness: awake and alert Pain management: pain level controlled Vital Signs Assessment: post-procedure vital signs reviewed and stable Respiratory status: spontaneous breathing, nonlabored ventilation, respiratory function stable and patient connected to nasal cannula oxygen Cardiovascular status: stable and blood pressure returned to baseline Postop Assessment: no apparent nausea or vomiting Anesthetic complications: no   No complications documented.  Last Vitals:  Vitals:   03/03/20 0300 03/03/20 0852  BP: (!) 151/61 (!) 151/61  Pulse: 60 60  Resp: 17 17  Temp: 37.1 C 37.1 C  SpO2: 98%     Last Pain:  Vitals:   03/03/20 0852  TempSrc: Oral  PainSc:                  [name]

## 2020-03-03 NOTE — Plan of Care (Signed)
Patient is s/p EGD on 10/26 to r/o GI bleed. Patient received 2 units of blood during this admission on 10/25. EGD was negative for GI bleed but did show some prepyloric gastric AVMs in  the distal small bowel. Patient is stable. Only complaint is of a sore throat from EGD so gave tramadol as ordered for pain. No other needs voiced or apparent distress. Will continue to monitor and continue current POC.

## 2020-03-03 NOTE — Progress Notes (Addendum)
Daily Rounding Note  03/03/2020, 1:01 PM  LOS: 2 days   SUBJECTIVE:   Chief complaint: Acute on chronic anemia.  FOBT +.     Still feels tired and gets SOB with walking small distances. On clear liquid diet and hungry.     OBJECTIVE:         Vital signs in last 24 hours:    Temp:  [98.7 F (37.1 C)-98.9 F (37.2 C)] 98.7 F (37.1 C) (10/27 0852) Pulse Rate:  [60-68] 60 (10/27 0852) Resp:  [17-18] 17 (10/27 0852) BP: (132-151)/(48-61) 151/61 (10/27 0852) SpO2:  [98 %] 98 % (10/27 0300) Weight:  [65.7 kg] 65.7 kg (10/27 0853) Last BM Date: 03/01/20 Filed Weights   03/03/20 0853  Weight: 65.7 kg   General: looks well.  Comfortable.     Heart: RRR Chest: clear bil.   Abdomen: soft, active BS.  NT.  ND.    Extremities: no CCE Neuro/Psych:  Oriented x 3.  Fully alert.  No gross deficits.  Fluid speech.     Lab Results: Recent Labs    03/01/20 1456 03/02/20 0631 03/03/20 0301  WBC 7.6 6.7 6.7  HGB 7.2* 8.9* 8.9*  HCT 23.3* 27.5* 27.7*  PLT 316 273 276   BMET Recent Labs    03/01/20 1456 03/02/20 0631 03/03/20 0301  NA 137 138 136  K 4.0 3.7 3.8  CL 105 108 106  CO2 17* 21* 19*  GLUCOSE 138* 86 79  BUN 53* 52* 46*  CREATININE 2.23* 2.17* 2.18*  CALCIUM 9.6 9.2 9.2   LFT Recent Labs    03/01/20 1456  PROT 8.1  ALBUMIN 4.1  AST 23  ALT 13  ALKPHOS 85  BILITOT 0.6    Studies/Results: DG Chest 2 View  Result Date: 03/01/2020 CLINICAL DATA:  Shortness of breath and anemia EXAM: CHEST - 2 VIEW COMPARISON:  May 20, 2018 FINDINGS: There is a minimal left pleural effusion. There is no edema or airspace opacity. There is cardiomegaly with pacemaker leads attached to right atrium, right ventricle, and coronary sinus. No adenopathy. There is aortic atherosclerosis. No bone lesions. IMPRESSION: Rather minimal left pleural effusion.  No edema or airspace opacity. Cardiomegaly with pacemaker  leads attached to right atrium, right ventricle, and coronary sinus. No adenopathy. Aortic Atherosclerosis (ICD10-I70.0). Electronically Signed   By: Lowella Grip III M.D.   On: 03/01/2020 15:11   Scheduled Meds: . allopurinol  300 mg Oral Daily  . amiodarone  200 mg Oral BID  . gabapentin  300 mg Oral BID  . levothyroxine  137 mcg Oral QAC breakfast  . sertraline  50 mg Oral Daily  . traZODone  50 mg Oral QHS   Continuous Infusions: . pantoprozole (PROTONIX) infusion 8 mg/hr (03/01/20 2243)   PRN Meds:.acetaminophen **OR** acetaminophen, colchicine, labetalol, ondansetron **OR** ondansetron (ZOFRAN) IV   ASSESMENT:   *   Acute on chronic anemia.   10/26 Enteroscopy: Trace column of esophageal varix without stigmata or bleeding.  Prepyloric superficial AVMs and other punctate vascular lesions obliterated with APC.  Superficial gastric trauma and small mucosal disruption at proximal esophagus both from scope trauma.  The tattoo previously placed at distal extent of previous enteroscopy observed. Bleeding and anemia possibly from gastric findings but more distal small bowel AVM could also be a source. Hgb 6.9 >> 2 PRBCs >> 8.9 Protonix gtt ongoing.  No PPI etc PTA.   *  Chronic Eliquis for A. Fib.  On hold.    *    Cirrhosis of the liver. Hepatitis C treated/eradicated with Harvoni. Cirrhosis well compensated with just trace esoph varices, no thrombocytopenia, normal LFTs.  INR is 1.3 but this can be seen in the setting of chronic Eliquis as well as cirrhosis.  No ascites seen on abdominal ultrasound of 02/06/2020.   PLAN   *  Stop IV Protonix drip when current bag finishes, switch to Protonix 40 mg po q day.   ? Does she need long term PPI and if so at what dose??  *   ? When will be ok to resume Eliquis?   *   Switched to [facility] diet.       [name]  03/03/2020, 1:01 PM Phone [place] Attending   I have taken an interval history, reviewed the  chart and examined the patient. I agree with the Advanced Practitioner's note, impression and recommendations. And see additional thoughts below.  Hgb stable after APC of antral vascular ectasia(s). ? If she might have early GAVE or a portal gastropathy - note in 2019 she had a jejunal AVM we could not reach - keep in mind if more bleeding would consider repeat capsule endoscopy considering we now have deeper balloon assisted enteroscopy capabilities  Brown stool today  I think she could go home in next 1-2 days - as long as Hgb stable and no signs bleeding  Can resume Eliquis now or AM  I will have her get a CBC at my office Tues 11/2 and we will make f/u plans after that  I gave her one dose of feraheme today - should start oral ferrous sulfate 325 mg qd at dc  Given difficulty with insurance payment for feraheme as outpt do nbot plan on second outpt dose  PPI qd x 1 month  [name], MD, [facility] 03/03/2020 4:15 PM [phone]

## 2020-03-03 NOTE — Progress Notes (Signed)
Patient tolerated clear liquids this am.  Reports feeling really hungry.  Patient co having sore throat from EGD

## 2020-03-03 NOTE — Plan of Care (Signed)

## 2020-03-03 NOTE — Progress Notes (Signed)
PROGRESS NOTE    Morgan Clay  YPP:[id number] DOB: 24-Jun-1945 DOA: 03/01/2020 PCP: Billie Ruddy, MD    Brief Narrative:  Morgan Clay is a 74 year old female with past medical history notable for atrial fibrillation on Eliquis, chronic diastolic congestive heart failure, CKD stage IV, hypothyroidism who presented to the emergency department with shortness of breath has been progressive over the past few weeks.  Patient also reports a dark color to her stool over the previous week but denies any bright red blood.  Recently underwent cardioversion for her underlying A. fib on 02/12/2020, on follow-up on 02/12/2020, her hemoglobin was noted to be 6.9, in comparison to 10.2 three weeks prior.  In the ED, hemoglobin to be 6.9, WBC count 7.1.  Sodium 137, potassium 4.1, glucose 116, BUN 54, creatinine 2.16.  GI was consulted.  Hospitalist service consulted for further evaluation and treatment of symptomatic anemia likely secondary to GI bleed.   Assessment & Plan:   Principal Problem:   GI bleed Active Problems:   Chronic diastolic CHF (congestive heart failure) (HCC)   CKD (chronic kidney disease) stage 4, GFR 15-29 ml/min    Symptomatic anemia   Atrial fibrillation, chronic   AVM (arteriovenous malformation) of small bowel, acquired   Anticoagulated   Cirrhosis of liver without ascites (HCC)   Overweight (BMI 25.0-29.9)   Acute on chronic anemia secondary to GI bleed/AVM Patient presenting with progressive shortness of breath.  History of chronic anticoagulation.  Patient was noted to have a hemoglobin of 6.9, previously 10.23 weeks prior.  Also notes a dark stool.  Iron 34, TIBC 505 (high), folte 10.6. Transfused 2 unit PRBC on 03/01/2020.  Patient underwent small bowel enteroscopy 03/02/2020 with findings trace esophageal varix, small superficial AVMs prepyloric stomach with other punctate vascular lesions treated with APC, benign duodenal polyp.  Etiology likely secondary to  jejunal AVM versus early GAVE vs portal gastropathy. --Hgb 6.9>7.2>8.9>8.9 --Feraheme x1 today; start ferrous sulfate 325 mg daily on discharge --Protonix drip transition to 40 mg p.o. daily; plan to continue for 1 month --Restart home Eliquis --To monitor CBC/hemoglobin daily --If recurrent bleeding, GI considering repeat capsule endoscopy given they have deeper balloon assisted enteroscopy capabilities now  Chronic diastolic congestive heart failure TTE 12/29/2019 with LVEF 50-55%, LV low normal function, no regional wall motion normalities, moderate LVH, grade 2 diastolic dysfunction, RV systolic function mildly reduced, mild MR, moderate AR. --Coreg 25 mg p.o. twice daily --Holding home isosorbide/hydralazine 20-37.5 mg TID, spironolactone 12.5mg  daily, and torsemide 40mg  PO daily  CKD stage IV Cr 2.17>2.18; at baseline --Avoid nephrotoxins, renally dose all medications --Follow renal function closely daily  Chronic paroxysmal atrial fibrillation --Amiodarone 200 mg p.o. twice daily --Carvedilol 25 mg p.o. bid --Restart Eliquis 5 mg p.o. BID today  History of hepatitis C --Status post treatment --Continue outpatient follow-up  Hypothyroidism: Continue levothyroxine 137 mcg p.o. daily  Depression: Sertraline 50 mg p.o. daily  Insomnia: Stone 50 mg p.o. daily  Cirrhosis without ascites Follows with outpatient hepatology, [facility] in [place], last seen by NP Drazek 01/08/2020. --Continue outpatient follow-up   DVT prophylaxis: Eliquis, SCDs Code Status: Full code Family Communication: No family present at bedside  Disposition Plan:  Status is: Inpatient  Remains inpatient appropriate because:Unsafe d/c plan, IV treatments appropriate due to intensity of illness or inability to take PO and Inpatient level of care appropriate due to severity of illness   Dispo: The patient is from: Home  Anticipated d/c is to: Home               Anticipated d/c date is: 1 day              Patient currently is not medically stable to d/c.   Consultants:   [facility]  Procedures:   Small bowel enteroscopy 03/02/2020  Antimicrobials:   None   Subjective: Patient seen and examined bedside, resting comfortably.  States she is hungry would like to advance her diet further.  Inquires into discharge planning.  No other complaints or concerns at this time.  Nursing present bedside.  Denies headache, no chest pain, no palpitations, no shortness of breath, no abdominal pain, no weakness, no fatigue, no fever/chills/night sweats.  No acute events overnight per nursing staff.  Objective: Vitals:   03/02/20 2100 03/03/20 0300 03/03/20 0852 03/03/20 0853  BP: (!) 132/48 (!) 151/61 (!) 151/61   Pulse: 68 60 60   Resp: 18 17 17    Temp: 98.9 F (37.2 C) 98.7 F (37.1 C) 98.7 F (37.1 C)   TempSrc: Oral Oral Oral   SpO2: 98% 98%    Weight:    65.7 kg  Height:   4\' 11"  (1.499 m)     Intake/Output Summary (Last 24 hours) at 03/03/2020 1712 Last data filed at 03/03/2020 0500 Gross per 24 hour  Intake 720 ml  Output --  Net 720 ml   Filed Weights   03/03/20 0853  Weight: 65.7 kg    Examination:  General exam: Appears calm and comfortable  Respiratory system: Clear to auscultation. Respiratory effort normal.  Oxygen well on room air Cardiovascular system: S1 & S2 heard, RRR. No JVD, murmurs, rubs, gallops or clicks. No pedal edema. Gastrointestinal system: Abdomen is nondistended, soft and nontender. No organomegaly or masses felt. Normal bowel sounds heard. Central nervous system: Alert and oriented. No focal neurological deficits. Extremities: Symmetric 5 x 5 power. Skin: No rashes, lesions or ulcers Psychiatry: Judgement and insight appear normal. Mood & affect appropriate.     Data Reviewed: I have personally reviewed following labs and imaging studies  CBC: Recent Labs  Lab 03/01/20 1134 03/01/20 1456  03/02/20 0631 03/03/20 0301  WBC 7.1 7.6 6.7 6.7  HGB 6.9* 7.2* 8.9* 8.9*  HCT 22.1* 23.3* 27.5* 27.7*  MCV 88.8 88.6 84.9 84.7  PLT 321 316 273 161   Basic Metabolic Panel: Recent Labs  Lab 03/01/20 1134 03/01/20 1456 03/02/20 0631 03/03/20 0301  NA 137 137 138 136  K 4.1 4.0 3.7 3.8  CL 107 105 108 106  CO2 20* 17* 21* 19*  GLUCOSE 116* 138* 86 79  BUN 54* 53* 52* 46*  CREATININE 2.16* 2.23* 2.17* 2.18*  CALCIUM 9.5 9.6 9.2 9.2   GFR: Estimated Creatinine Clearance: 19.2 mL/min (A) (by C-G formula based on SCr of 2.18 mg/dL (H)). Liver Function Tests: Recent Labs  Lab 03/01/20 1456  AST 23  ALT 13  ALKPHOS 85  BILITOT 0.6  PROT 8.1  ALBUMIN 4.1   No results for input(s): LIPASE, AMYLASE in the last 168 hours. No results for input(s): AMMONIA in the last 168 hours. Coagulation Profile: No results for input(s): INR, PROTIME in the last 168 hours. Cardiac Enzymes: No results for input(s): CKTOTAL, CKMB, CKMBINDEX, TROPONINI in the last 168 hours. BNP (last 3 results) Recent Labs    07/17/19 1352 07/23/19 1033  PROBNP 811.0* 637.0*   HbA1C: No results for input(s): HGBA1C in the last 72  hours. CBG: No results for input(s): GLUCAP in the last 168 hours. Lipid Profile: No results for input(s): CHOL, HDL, LDLCALC, TRIG, CHOLHDL, LDLDIRECT in the last 72 hours. Thyroid Function Tests: No results for input(s): TSH, T4TOTAL, FREET4, T3FREE, THYROIDAB in the last 72 hours. Anemia Panel: Recent Labs    03/02/20 0220 03/02/20 0631  TIBC  --  505*  IRON  --  34  RETICCTPCT 2.6  --    Sepsis Labs: No results for input(s): PROCALCITON, LATICACIDVEN in the last 168 hours.  Recent Results (from the past 240 hour(s))  Resp Panel by RT PCR (RSV, Flu A&B, Covid) - Nasopharyngeal Swab     Status: None   Collection Time: 03/01/20 10:46 PM   Specimen: Nasopharyngeal Swab  Result Value Ref Range Status   SARS Coronavirus 2 by RT PCR NEGATIVE NEGATIVE Final     Comment: (NOTE) SARS-CoV-2 target nucleic acids are NOT DETECTED.  The SARS-CoV-2 RNA is generally detectable in upper respiratoy specimens during the acute phase of infection. The lowest concentration of SARS-CoV-2 viral copies this assay can detect is 131 copies/mL. A negative result does not preclude SARS-Cov-2 infection and should not be used as the sole basis for treatment or other patient management decisions. A negative result may occur with  improper specimen collection/handling, submission of specimen other than nasopharyngeal swab, presence of viral mutation(s) within the areas targeted by this assay, and inadequate number of viral copies (<131 copies/mL). A negative result must be combined with clinical observations, patient history, and epidemiological information. The expected result is Negative.  Fact Sheet for Patients:  PinkCheek.be  Fact Sheet for Healthcare Providers:  GravelBags.it  This test is no t yet approved or cleared by the [place] FDA and  has been authorized for detection and/or diagnosis of SARS-CoV-2 by FDA under an Emergency Use Authorization (EUA). This EUA will remain  in effect (meaning this test can be used) for the duration of the COVID-19 declaration under Section 564(b)(1) of the Act, 21 U.S.C. section 360bbb-3(b)(1), unless the authorization is terminated or revoked sooner.     Influenza A by PCR NEGATIVE NEGATIVE Final   Influenza B by PCR NEGATIVE NEGATIVE Final    Comment: (NOTE) The Xpert Xpress SARS-CoV-2/FLU/RSV assay is intended as an aid in  the diagnosis of influenza from Nasopharyngeal swab specimens and  should not be used as a sole basis for treatment. Nasal washings and  aspirates are unacceptable for Xpert Xpress SARS-CoV-2/FLU/RSV  testing.  Fact Sheet for Patients: PinkCheek.be  Fact Sheet for Healthcare  Providers: GravelBags.it  This test is not yet approved or cleared by the [place] FDA and  has been authorized for detection and/or diagnosis of SARS-CoV-2 by  FDA under an Emergency Use Authorization (EUA). This EUA will remain  in effect (meaning this test can be used) for the duration of the  Covid-19 declaration under Section 564(b)(1) of the Act, 21  U.S.C. section 360bbb-3(b)(1), unless the authorization is  terminated or revoked.    Respiratory Syncytial Virus by PCR NEGATIVE NEGATIVE Final    Comment: (NOTE) Fact Sheet for Patients: PinkCheek.be  Fact Sheet for Healthcare Providers: GravelBags.it  This test is not yet approved or cleared by the [place] FDA and  has been authorized for detection and/or diagnosis of SARS-CoV-2 by  FDA under an Emergency Use Authorization (EUA). This EUA will remain  in effect (meaning this test can be used) for the duration of the  COVID-19 declaration under Section 564(b)(1)  of the Act, 21 U.S.C.  section 360bbb-3(b)(1), unless the authorization is terminated or  revoked. Performed at [facility], [place] [address], [place], [place] [address]          Radiology Studies: No results found.      Scheduled Meds: . allopurinol  300 mg Oral Daily  . amiodarone  200 mg Oral BID  . apixaban  5 mg Oral BID  . carvedilol  25 mg Oral BID WC  . gabapentin  300 mg Oral BID  . levothyroxine  137 mcg Oral QAC breakfast  . [START ON 03/04/2020] pantoprazole  40 mg Oral Q0600  . sertraline  50 mg Oral Daily  . torsemide  40 mg Oral Daily  . traZODone  50 mg Oral QHS   Continuous Infusions: . ferumoxytol       LOS: 2 days    Time spent: 37 minutes spent on chart review, discussion with nursing staff, consultants, updating family and interview/physical exam; more than 50% of that time was spent in counseling and/or coordination of  care.    Alixander Rallis J [place] DO [facility] Available via Epic secure chat 7am-7pm After these hours, please refer to coverage provider listed on [email or web address] 03/03/2020, 5:12 PM

## 2020-03-04 ENCOUNTER — Telehealth: Payer: Self-pay | Admitting: Family Medicine

## 2020-03-04 ENCOUNTER — Other Ambulatory Visit: Payer: Self-pay | Admitting: Internal Medicine

## 2020-03-04 DIAGNOSIS — D62 Acute posthemorrhagic anemia: Secondary | ICD-10-CM

## 2020-03-04 DIAGNOSIS — Z7901 Long term (current) use of anticoagulants: Secondary | ICD-10-CM | POA: Diagnosis not present

## 2020-03-04 DIAGNOSIS — K5520 Angiodysplasia of colon without hemorrhage: Secondary | ICD-10-CM | POA: Diagnosis not present

## 2020-03-04 DIAGNOSIS — K31811 Angiodysplasia of stomach and duodenum with bleeding: Secondary | ICD-10-CM | POA: Diagnosis not present

## 2020-03-04 DIAGNOSIS — I482 Chronic atrial fibrillation, unspecified: Secondary | ICD-10-CM | POA: Diagnosis not present

## 2020-03-04 LAB — BASIC METABOLIC PANEL
Anion gap: 11 (ref 5–15)
BUN: 43 mg/dL — ABNORMAL HIGH (ref 8–23)
CO2: 20 mmol/L — ABNORMAL LOW (ref 22–32)
Calcium: 9.3 mg/dL (ref 8.9–10.3)
Chloride: 104 mmol/L (ref 98–111)
Creatinine, Ser: 2.23 mg/dL — ABNORMAL HIGH (ref 0.44–1.00)
GFR, Estimated: 23 mL/min — ABNORMAL LOW (ref >60)
Glucose, Bld: 91 mg/dL (ref 70–99)
Potassium: 3.9 mmol/L (ref 3.5–5.1)
Sodium: 135 mmol/L (ref 135–145)

## 2020-03-04 LAB — CBC
HCT: 28.7 % — ABNORMAL LOW (ref 36.0–46.0)
Hemoglobin: 9.2 g/dL — ABNORMAL LOW (ref 12.0–15.0)
MCH: 27.5 pg (ref 26.0–34.0)
MCHC: 32.1 g/dL (ref 30.0–36.0)
MCV: 85.9 fL (ref 80.0–100.0)
Platelets: 265 10*3/uL (ref 150–400)
RBC: 3.34 MIL/uL — ABNORMAL LOW (ref 3.87–5.11)
RDW: 19.3 % — ABNORMAL HIGH (ref 11.5–15.5)
WBC: 6.6 10*3/uL (ref 4.0–10.5)
nRBC: 0.3 % — ABNORMAL HIGH (ref 0.0–0.2)

## 2020-03-04 MED ORDER — FERROUS SULFATE 325 (65 FE) MG PO TABS: 325.0000 mg | ORAL_TABLET | Freq: Every day | ORAL | 3 refills | Status: DC

## 2020-03-04 MED ORDER — PANTOPRAZOLE SODIUM 40 MG PO TBEC: 40.0000 mg | DELAYED_RELEASE_TABLET | Freq: Every day | ORAL | 0 refills | Status: DC

## 2020-03-04 NOTE — Plan of Care (Signed)

## 2020-03-04 NOTE — Plan of Care (Signed)
  Problem: Education: Goal: Knowledge of General Education information will improve Description: Including pain rating scale, medication(s)/side effects and non-pharmacologic comfort measures Outcome: Completed/Met  Discharge instructions reviewed with patient.  These included, but were not limited to, the following:  f/u appointments, when to call the MD, symptoms of GI bleed, information and side effects of Elliquis, recommended diet, medications, medication list, etc.  Comprehension of material ascertained via "teach-back" method of education.  Patient discharged to her daughter's home via private vehicle.  She was escorted via wheelchair to exit accompanied by a vonunteer.

## 2020-03-04 NOTE — Discharge Summary (Signed)
Physician Discharge Summary  Morgan Clay WUJ:[id number] DOB: 1945-12-18 DOA: 03/01/2020  PCP: Billie Ruddy, MD  Admit date: 03/01/2020 Discharge date: 03/04/2020  Admitted From: Home Disposition: Home  Recommendations for Outpatient Follow-up:  1. Follow up with PCP in 1-2 weeks 2. Follow-up with gastroenterology, [name] as scheduled; has scheduled appointments at GI office on Tuesday, 03/09/2020 for CBC. 3. Continue Protonix 40 mg p.o. daily x30 days 4. Started on ferrous sulfate 325 mg p.o. daily 5. Resumed home Eliquis per GI  Home Health: No Equipment/Devices: None  Discharge Condition: Stable CODE STATUS: Full code Diet recommendation: Heart healthy diet  History of present illness:  Archie Atilano is a 74 year old female with past medical history notable for atrial fibrillation on Eliquis, chronic diastolic congestive heart failure, CKD stage IV, hypothyroidism who presented to the emergency department with shortness of breath has been progressive over the past few weeks.  Patient also reports a dark color to her stool over the previous week but denies any bright red blood.  Recently underwent cardioversion for her underlying A. fib on 02/12/2020, on follow-up on 02/12/2020, her hemoglobin was noted to be 6.9, in comparison to 10.2 three weeks prior.  In the ED, hemoglobin to be 6.9, WBC count 7.1.  Sodium 137, potassium 4.1, glucose 116, BUN 54, creatinine 2.16.  GI was consulted.  Hospitalist service consulted for further evaluation and treatment of symptomatic anemia likely secondary to GI bleed.  Hospital course:  Acute on chronic anemia secondary to GI bleed/AVM Patient presenting with progressive shortness of breath.  History of chronic anticoagulation.  Patient was noted to have a hemoglobin of 6.9, previously 10.23 weeks prior.  Also notes a dark stool.  Iron 34, TIBC 505 (high), folte 10.6. Transfused 2 unit PRBC on 03/01/2020.  Patient underwent small  bowel enteroscopy 03/02/2020 with findings trace esophageal varix, small superficial AVMs prepyloric stomach with other punctate vascular lesions treated with APC, benign duodenal polyp.  Etiology likely secondary to jejunal AVM versus early GAVE vs portal gastropathy.  Patient given IV Feraheme on 03/03/2020.  Continue Protonix 40 mg p.o. daily x1 month.  Okay to restart Eliquis.  Hemoglobin stable at time of discharge, 9.2.  Has follow-up with GI on 03/09/2020 for CBC.  Started on ferrous sulfate 3 and 25 mg p.o. daily.  Chronic diastolic congestive heart failure TTE 12/29/2019 with LVEF 50-55%, LV low normal function, no regional wall motion normalities, moderate LVH, grade 2 diastolic dysfunction, RV systolic function mildly reduced, mild MR, moderate AR.  Continue home Coreg 25 mg p.o. twice daily, isosorbide/hydralazine 20-37.5 mg TID, spironolactone 12.5mg  daily, and torsemide 40mg  PO daily  CKD stage IV Creatinine 2.23 at time of discharge.  Chronic paroxysmal atrial fibrillation Continue home amiodarone 200 mg p.o. twice daily, Carvedilol 25 mg p.o. bid, Eliquis 5 mg p.o. BID today  History of hepatitis C Status post treatment, continue outpatient follow-up  Hypothyroidism: Continue levothyroxine 137 mcg p.o. daily  Depression: Sertraline 50 mg p.o. daily  Insomnia: Stone 50 mg p.o. daily  Cirrhosis without ascites Follows with outpatient hepatology, [facility] in [place], last seen by NP Drazek 01/08/2020. Continue outpatient follow-up  Discharge Diagnoses:  Principal Problem:   GI bleed Active Problems:   Chronic diastolic CHF (congestive heart failure) (HCC)   CKD (chronic kidney disease) stage 4, GFR 15-29 ml/min    Atrial fibrillation, chronic   AVM (arteriovenous malformation) of small bowel, acquired   Anticoagulated   Cirrhosis of liver without  ascites (HCC)   Overweight (BMI 25.0-29.9)    Discharge Instructions  Discharge  Instructions    Call MD for:  difficulty breathing, headache or visual disturbances   Complete by: As directed    Call MD for:  extreme fatigue   Complete by: As directed    Call MD for:  persistant dizziness or light-headedness   Complete by: As directed    Call MD for:  persistant nausea and vomiting   Complete by: As directed    Call MD for:  severe uncontrolled pain   Complete by: As directed    Call MD for:  temperature >100.4   Complete by: As directed    Diet - low sodium heart healthy   Complete by: As directed    Increase activity slowly   Complete by: As directed      Allergies as of 03/04/2020   No Known Allergies     Medication List    TAKE these medications   allopurinol 300 MG tablet Commonly known as: ZYLOPRIM Take 300 mg by mouth daily.   amiodarone 200 MG tablet Commonly known as: PACERONE Take 1 tablet (200 mg total) by mouth 2 (two) times daily.   apixaban 5 MG Tabs tablet Commonly known as: Eliquis Take 1 tablet (5 mg total) by mouth 2 (two) times daily.   BiDil 20-37.5 MG tablet Generic drug: isosorbide-hydrALAZINE Take 1 tablet by mouth 3 (three) times daily.   calcitRIOL 0.25 MCG capsule Commonly known as: ROCALTROL Take 0.25 mcg by mouth 3 (three) times a week.   carvedilol 25 MG tablet Commonly known as: COREG Take 25 mg by mouth 2 (two) times daily with a meal.   colchicine 0.6 MG tablet Take 0.6 mg by mouth daily as needed (gout flares).   ferrous sulfate 325 (65 FE) MG tablet Take 1 tablet (325 mg total) by mouth daily.   gabapentin 300 MG capsule Commonly known as: NEURONTIN Take 1 capsule (300 mg total) by mouth 2 (two) times daily.   levothyroxine 137 MCG tablet Commonly known as: SYNTHROID Take 137 mcg by mouth daily before breakfast.   nitroGLYCERIN 0.4 MG SL tablet Commonly known as: NITROSTAT Place 0.4 mg under the tongue every 5 (five) minutes as needed for chest pain.   pantoprazole 40 MG tablet Commonly known  as: PROTONIX Take 1 tablet (40 mg total) by mouth daily at 6 (six) AM. Start taking on: March 05, 2020   sertraline 50 MG tablet Commonly known as: ZOLOFT Take 50 mg by mouth daily.   spironolactone 25 MG tablet Commonly known as: ALDACTONE Take 0.5 tablets (12.5 mg total) by mouth daily.   torsemide 20 MG tablet Commonly known as: DEMADEX Take 2 tablets (40 mg total) by mouth daily.   traZODone 50 MG tablet Commonly known as: DESYREL Take 50 mg by mouth at bedtime.       Follow-up Information    Billie Ruddy, MD. Schedule an appointment as soon as possible for a visit in 1 week(s).   Specialty: Family Medicine Contact information: [place] [address] [phone]        [name], MD .   Specialty: Cardiology Contact information: [phone] [address] [phone]        [name], MD. Call in 1 week(s).   Specialty: Gastroenterology Contact information: [address] [place] [address] [phone]  No Known Allergies  Consultations:  [facility]   Procedures/Studies: DG Chest 2 View  Result Date: 03/01/2020 CLINICAL DATA:  Shortness of breath and anemia EXAM: CHEST - 2 VIEW COMPARISON:  May 20, 2018 FINDINGS: There is a minimal left pleural effusion. There is no edema or airspace opacity. There is cardiomegaly with pacemaker leads attached to right atrium, right ventricle, and coronary sinus. No adenopathy. There is aortic atherosclerosis. No bone lesions. IMPRESSION: Rather minimal left pleural effusion.  No edema or airspace opacity. Cardiomegaly with pacemaker leads attached to right atrium, right ventricle, and coronary sinus. No adenopathy. Aortic Atherosclerosis (ICD10-I70.0). Electronically Signed   By: Lowella Grip III M.D.   On: 03/01/2020 15:11   US Abdomen Complete  Result Date: 02/06/2020 CLINICAL DATA:  Cirrhosis, hepatitis-C,  obesity, hypertension, diabetes mellitus, kidney disease EXAM: ABDOMEN ULTRASOUND COMPLETE COMPARISON:  08/25/2019 FINDINGS: Gallbladder: Normally distended without stones or wall thickening. No pericholecystic fluid or sonographic Murphy sign. Common bile duct: Diameter: 5 mm, normal Liver: Coarsened echogenicity of the liver. Minimally nodular hepatic contour. No focal hepatic mass or intrahepatic biliary dilatation. Portal vein is patent on color Doppler imaging with normal direction of blood flow towards the liver. IVC: Normal appearance Pancreas: Distal tail obscured by bowel gas. Visualized portion normal appearance. Spleen: Normal appearance, 6.3 cm length Right Kidney: Length: 9.2 cm. Normal morphology without mass or hydronephrosis. Left Kidney: Length: 8.6 cm. Normal morphology without mass or hydronephrosis. Abdominal aorta: Normal caliber Other findings: No free fluid IMPRESSION: Incomplete visualization of pancreas. Cirrhotic appearing liver without mass. Remainder of exam unremarkable. Electronically Signed   By: Lavonia [name] M.D.   On: 02/06/2020 17:51      Subjective: Patient seen and examined bedside, resting comfortably.  Ready for discharge home.  Hemoglobin stable, up to 9.2 today.  Restarted Eliquis yesterday.  No other complaints or concerns at this time.  Denies headache, no fever/chills/night sweats, no nausea/vomiting/diarrhea, no chest pain, no palpitations, no shortness of breath, no abdominal pain.  No acute events overnight per nursing staff.  Discharge Exam: Vitals:   03/04/20 0344 03/04/20 0842  BP: (!) 139/48 133/90  Pulse: (!) 59 60  Resp: 17   Temp: 98.3 F (36.8 C) 98.2 F (36.8 C)  SpO2: 100% 100%   Vitals:   03/03/20 0853 03/03/20 2042 03/04/20 0344 03/04/20 0842  BP:  (!) 157/62 (!) 139/48 133/90  Pulse:  60 (!) 59 60  Resp:  18 17   Temp:  98.6 F (37 C) 98.3 F (36.8 C) 98.2 F (36.8 C)  TempSrc:  Oral Oral Oral  SpO2:  100% 100% 100%  Weight: 65.7  kg     Height:        General: Pt is alert, awake, not in acute distress Cardiovascular: RRR, S1/S2 +, no rubs, no gallops Respiratory: CTA bilaterally, no wheezing, no rhonchi Abdominal: Soft, NT, ND, bowel sounds + Extremities: no edema, no cyanosis    The results of significant diagnostics from this hospitalization (including imaging, microbiology, ancillary and laboratory) are listed below for reference.     Microbiology: Recent Results (from the past 240 hour(s))  Resp Panel by RT PCR (RSV, Flu A&B, Covid) - Nasopharyngeal Swab     Status: None   Collection Time: 03/01/20 10:46 PM   Specimen: Nasopharyngeal Swab  Result Value Ref Range Status   SARS Coronavirus 2 by RT PCR NEGATIVE NEGATIVE Final    Comment: (NOTE) SARS-CoV-2 target nucleic acids are NOT DETECTED.  The  SARS-CoV-2 RNA is generally detectable in upper respiratoy specimens during the acute phase of infection. The lowest concentration of SARS-CoV-2 viral copies this assay can detect is 131 copies/mL. A negative result does not preclude SARS-Cov-2 infection and should not be used as the sole basis for treatment or other patient management decisions. A negative result may occur with  improper specimen collection/handling, submission of specimen other than nasopharyngeal swab, presence of viral mutation(s) within the areas targeted by this assay, and inadequate number of viral copies (<131 copies/mL). A negative result must be combined with clinical observations, patient history, and epidemiological information. The expected result is Negative.  Fact Sheet for Patients:  PinkCheek.be  Fact Sheet for Healthcare Providers:  GravelBags.it  This test is no t yet approved or cleared by the [place] FDA and  has been authorized for detection and/or diagnosis of SARS-CoV-2 by FDA under an Emergency Use Authorization (EUA). This EUA will remain  in  effect (meaning this test can be used) for the duration of the COVID-19 declaration under Section 564(b)(1) of the Act, 21 U.S.C. section 360bbb-3(b)(1), unless the authorization is terminated or revoked sooner.     Influenza A by PCR NEGATIVE NEGATIVE Final   Influenza B by PCR NEGATIVE NEGATIVE Final    Comment: (NOTE) The Xpert Xpress SARS-CoV-2/FLU/RSV assay is intended as an aid in  the diagnosis of influenza from Nasopharyngeal swab specimens and  should not be used as a sole basis for treatment. Nasal washings and  aspirates are unacceptable for Xpert Xpress SARS-CoV-2/FLU/RSV  testing.  Fact Sheet for Patients: PinkCheek.be  Fact Sheet for Healthcare Providers: GravelBags.it  This test is not yet approved or cleared by the [place] FDA and  has been authorized for detection and/or diagnosis of SARS-CoV-2 by  FDA under an Emergency Use Authorization (EUA). This EUA will remain  in effect (meaning this test can be used) for the duration of the  Covid-19 declaration under Section 564(b)(1) of the Act, 21  U.S.C. section 360bbb-3(b)(1), unless the authorization is  terminated or revoked.    Respiratory Syncytial Virus by PCR NEGATIVE NEGATIVE Final    Comment: (NOTE) Fact Sheet for Patients: PinkCheek.be  Fact Sheet for Healthcare Providers: GravelBags.it  This test is not yet approved or cleared by the [place] FDA and  has been authorized for detection and/or diagnosis of SARS-CoV-2 by  FDA under an Emergency Use Authorization (EUA). This EUA will remain  in effect (meaning this test can be used) for the duration of the  COVID-19 declaration under Section 564(b)(1) of the Act, 21 U.S.C.  section 360bbb-3(b)(1), unless the authorization is terminated or  revoked. Performed at [facility], [place] [address], [place],  [address]       Labs: BNP (last 3 results) Recent Labs    02/04/20 1416  BNP 295*   Basic Metabolic Panel: Recent Labs  Lab 03/01/20 1134 03/01/20 1456 03/02/20 0631 03/03/20 0301 03/04/20 0245  NA 137 137 138 136 135  K 4.1 4.0 3.7 3.8 3.9  CL 107 105 108 106 104  CO2 20* 17* 21* 19* 20*  GLUCOSE 116* 138* 86 79 91  BUN 54* 53* 52* 46* 43*  CREATININE 2.16* 2.23* 2.17* 2.18* 2.23*  CALCIUM 9.5 9.6 9.2 9.2 9.3   Liver Function Tests: Recent Labs  Lab 03/01/20 1456  AST 23  ALT 13  ALKPHOS 85  BILITOT 0.6  PROT 8.1  ALBUMIN 4.1   No results for input(s): LIPASE,  AMYLASE in the last 168 hours. No results for input(s): AMMONIA in the last 168 hours. CBC: Recent Labs  Lab 03/01/20 1134 03/01/20 1456 03/02/20 0631 03/03/20 0301 03/04/20 0245  WBC 7.1 7.6 6.7 6.7 6.6  HGB 6.9* 7.2* 8.9* 8.9* 9.2*  HCT 22.1* 23.3* 27.5* 27.7* 28.7*  MCV 88.8 88.6 84.9 84.7 85.9  PLT 321 316 273 276 265   Cardiac Enzymes: No results for input(s): CKTOTAL, CKMB, CKMBINDEX, TROPONINI in the last 168 hours. BNP: Invalid input(s): POCBNP CBG: No results for input(s): GLUCAP in the last 168 hours. D-Dimer No results for input(s): DDIMER in the last 72 hours. Hgb A1c No results for input(s): HGBA1C in the last 72 hours. Lipid Profile No results for input(s): CHOL, HDL, LDLCALC, TRIG, CHOLHDL, LDLDIRECT in the last 72 hours. Thyroid function studies No results for input(s): TSH, T4TOTAL, T3FREE, THYROIDAB in the last 72 hours.  Invalid input(s): FREET3 Anemia work up Recent Labs    03/02/20 0220 03/02/20 0631  TIBC  --  505*  IRON  --  34  RETICCTPCT 2.6  --    Urinalysis    Component Value Date/Time   COLORURINE YELLOW 03/05/2018 0902   APPEARANCEUR CLEAR 03/05/2018 0902   LABSPEC 1.008 03/05/2018 0902   PHURINE 5.0 03/05/2018 0902   GLUCOSEU NEGATIVE 03/05/2018 0902   HGBUR NEGATIVE 03/05/2018 0902   HGBUR negative 02/28/2008 0000   BILIRUBINUR NEGATIVE 03/05/2018 0902    KETONESUR 5 (A) 03/05/2018 0902   PROTEINUR NEGATIVE 03/05/2018 0902   UROBILINOGEN 1.0 07/19/2010 1752   NITRITE NEGATIVE 03/05/2018 0902   LEUKOCYTESUR NEGATIVE 03/05/2018 0902   Sepsis Labs Invalid input(s): PROCALCITONIN,  WBC,  LACTICIDVEN Microbiology Recent Results (from the past 240 hour(s))  Resp Panel by RT PCR (RSV, Flu A&B, Covid) - Nasopharyngeal Swab     Status: None   Collection Time: 03/01/20 10:46 PM   Specimen: Nasopharyngeal Swab  Result Value Ref Range Status   SARS Coronavirus 2 by RT PCR NEGATIVE NEGATIVE Final    Comment: (NOTE) SARS-CoV-2 target nucleic acids are NOT DETECTED.  The SARS-CoV-2 RNA is generally detectable in upper respiratoy specimens during the acute phase of infection. The lowest concentration of SARS-CoV-2 viral copies this assay can detect is 131 copies/mL. A negative result does not preclude SARS-Cov-2 infection and should not be used as the sole basis for treatment or other patient management decisions. A negative result may occur with  improper specimen collection/handling, submission of specimen other than nasopharyngeal swab, presence of viral mutation(s) within the areas targeted by this assay, and inadequate number of viral copies (<131 copies/mL). A negative result must be combined with clinical observations, patient history, and epidemiological information. The expected result is Negative.  Fact Sheet for Patients:  PinkCheek.be  Fact Sheet for Healthcare Providers:  GravelBags.it  This test is no t yet approved or cleared by the [place] FDA and  has been authorized for detection and/or diagnosis of SARS-CoV-2 by FDA under an Emergency Use Authorization (EUA). This EUA will remain  in effect (meaning this test can be used) for the duration of the COVID-19 declaration under Section 564(b)(1) of the Act, 21 U.S.C. section 360bbb-3(b)(1), unless the authorization  is terminated or revoked sooner.     Influenza A by PCR NEGATIVE NEGATIVE Final   Influenza B by PCR NEGATIVE NEGATIVE Final    Comment: (NOTE) The Xpert Xpress SARS-CoV-2/FLU/RSV assay is intended as an aid in  the diagnosis of influenza from Nasopharyngeal swab specimens and  should not be used as a sole basis for treatment. Nasal washings and  aspirates are unacceptable for Xpert Xpress SARS-CoV-2/FLU/RSV  testing.  Fact Sheet for Patients: PinkCheek.be  Fact Sheet for Healthcare Providers: GravelBags.it  This test is not yet approved or cleared by the [place] FDA and  has been authorized for detection and/or diagnosis of SARS-CoV-2 by  FDA under an Emergency Use Authorization (EUA). This EUA will remain  in effect (meaning this test can be used) for the duration of the  Covid-19 declaration under Section 564(b)(1) of the Act, 21  U.S.C. section 360bbb-3(b)(1), unless the authorization is  terminated or revoked.    Respiratory Syncytial Virus by PCR NEGATIVE NEGATIVE Final    Comment: (NOTE) Fact Sheet for Patients: PinkCheek.be  Fact Sheet for Healthcare Providers: GravelBags.it  This test is not yet approved or cleared by the [place] FDA and  has been authorized for detection and/or diagnosis of SARS-CoV-2 by  FDA under an Emergency Use Authorization (EUA). This EUA will remain  in effect (meaning this test can be used) for the duration of the  COVID-19 declaration under Section 564(b)(1) of the Act, 21 U.S.C.  section 360bbb-3(b)(1), unless the authorization is terminated or  revoked. Performed at [facility], [place] [address], [place], [place] [address]      Time coordinating discharge: Over 30 minutes  SIGNED:   Aaric Dolph J [place] DO  [facility] 03/04/2020, 8:55 AM

## 2020-03-04 NOTE — Telephone Encounter (Signed)
Transition Care Management Follow-up Telephone Call  Date of discharge and from where: 03/04/2020 from [facility]   How have you been since you were released from the hospital? Patient states that she is doing ok. She is experiencing low energy   Any questions or concerns? No  Items Reviewed:  Did the pt receive and understand the discharge instructions provided? Yes   Medications obtained and verified? Yes   Other? No   Any new allergies since your discharge? No   Dietary orders reviewed? Yes  Do you have support at home? Yes   Home Care and Equipment/Supplies: Were home health services ordered? not applicable If so, what is the name of the agency? N/A  Has the agency set up a time to come to the patient's home? not applicable Were any new equipment or medical supplies ordered?  No What is the name of the medical supply agency? N/A Were you able to get the supplies/equipment? not applicable Do you have any questions related to the use of the equipment or supplies? No  Functional Questionnaire: (I = Independent and D = Dependent) ADLs: I  Bathing/Dressing- I  Meal Prep- I  Eating- I  Maintaining continence- I  Transferring/Ambulation- I  Managing Meds- I  Follow up appointments reviewed:   PCP Hospital f/u appt confirmed? Yes  Scheduled to see Dr. Volanda Napoleon on 03/11/2020 @ 1:30 PM.  [facility] f/u appt confirmed? No    Are transportation arrangements needed? No   If their condition worsens, is the pt aware to call PCP or go to the Emergency Dept.? Yes  Was the patient provided with contact information for the PCP's office or ED? Yes  Was to pt encouraged to call back with questions or concerns? Yes

## 2020-03-08 ENCOUNTER — Ambulatory Visit (INDEPENDENT_AMBULATORY_CARE_PROVIDER_SITE_OTHER): Payer: Medicare Other

## 2020-03-08 DIAGNOSIS — I428 Other cardiomyopathies: Secondary | ICD-10-CM

## 2020-03-08 DIAGNOSIS — Z95810 Presence of automatic (implantable) cardiac defibrillator: Secondary | ICD-10-CM

## 2020-03-08 DIAGNOSIS — I5032 Chronic diastolic (congestive) heart failure: Secondary | ICD-10-CM

## 2020-03-09 ENCOUNTER — Other Ambulatory Visit (INDEPENDENT_AMBULATORY_CARE_PROVIDER_SITE_OTHER): Payer: Medicare Other

## 2020-03-09 DIAGNOSIS — D62 Acute posthemorrhagic anemia: Secondary | ICD-10-CM | POA: Diagnosis not present

## 2020-03-09 LAB — CBC
HCT: 30.4 % — ABNORMAL LOW (ref 36.0–46.0)
Hemoglobin: 9.9 g/dL — ABNORMAL LOW (ref 12.0–15.0)
MCHC: 32.6 g/dL (ref 30.0–36.0)
MCV: 85.3 fl (ref 78.0–100.0)
Platelets: 253 10*3/uL (ref 150.0–400.0)
RBC: 3.56 Mil/uL — ABNORMAL LOW (ref 3.87–5.11)
RDW: 20.8 % — ABNORMAL HIGH (ref 11.5–15.5)
WBC: 6.5 10*3/uL (ref 4.0–10.5)

## 2020-03-09 NOTE — Progress Notes (Signed)
EPIC Encounter for ICM Monitoring  Patient Name: Morgan Clay is a 74 y.o. female Date: 03/09/2020 Primary Care Physican: Billie Ruddy, MD Primary Cardiologist:[place] Electrophysiologist: Caryl Comes Nephrologist: [name] Bi-V Pacing:99.1% 11/2/2021OfficeWeight:148lbs  Clinical Status (01-Mar-2020 to 08-Mar-2020) Time in AT/AF 24.0 hr/day (100.0%) Longest AT/AF 14 days   Spoke with patient and reports she does not think she has any fluid symptoms at this time.   Hospitalized 10/25-10/28 for Acute on chronic anemia secondary to GI bleed.  Cardioversion 02/12/2020.  Optivol thoracic impedancesuggesting possible fluid accumulation continues since 02/22/2020.  Prescribed:  Torsemide20 mgTake2tablets (40 mg total)daily.  Confirmed on 11/2 she is taking as prescribed.  Spironolactone 25 mg take 0.5 tablet (12.5 mg total) daily  Eliquis 5 mg take 1 tablet twice a day  Labs: 03/04/2020 Creatinine 2.23, BUN 43, Potassium 3.9, Sodium 135, GFR 23 03/03/2020 Creatinine 2.18, BUN 46, Potassium 3.8, Sodium 136, GFR 23  03/02/2020 Creatinine 2.17, BUN 52, Potassium 3.7, Sodium 138, GFR 24  03/01/2020 Creatinine 2.23, BUN 53, Potassium 4.3, Sodium 137, GFR 23  02/12/2020 Creatinine 2.80, BUN 75, Potassium 4.6, Sodium 137  02/04/2020 Creatinine 2.24, BUN 60, Potassium 4.5, Sodium 135  01/06/2020 Creatinine 2.57, BUN 69, Potassium 4.9, Sodium 130, GFR 18-21 12/29/2019 Creatinine 2.88, BUN 80, Potassium 3.9, Sodium 132, GFR 16-18 A complete set of results can be found in Results Review.  Recommendations:  Advised will send to Dr [name] for review and recommendations if needed.   Follow-up plan: ICM clinic phone appointment on11/01/2020 to recheck fluid levels. 91 day device clinic remote transmission12/28/2021.  EP/Cardiology Office Visits:04/06/2020 with [facility] PA/NP.Last EP telehealth visit was 07/24/2019 (no recall)  Copy of ICM  check sent to Dr.Klein and Dr [name] for review and recommendations.   3 month ICM trend: 03/08/2020    1 Year ICM trend:       [name], RN 03/09/2020 10:14 AM

## 2020-03-10 MED ORDER — TORSEMIDE 20 MG PO TABS: ORAL_TABLET | ORAL | 3 refills | Status: DC

## 2020-03-10 NOTE — Progress Notes (Signed)
Attempted call to patient to advise of Dr Claris Gladden recommendations and voice mail box not set up.  Unable to leave message.

## 2020-03-10 NOTE — Progress Notes (Signed)
Received call back from patient.  Advised of Dr Claris Gladden recommendations to Increase torsemide to 40 qam and /20 qpm, BMET 1 week.   She verbalized understanding of change in Torsemide dosage.  BMET scheduled 03/17/2020 at 2:15 PM.  Advised to call back if she develops any problems with taking extra 20 mg dosage in afternoon or if she develops any change in condition.  She has enough supply on hand for Torsemide dosage at this time.

## 2020-03-10 NOTE — Progress Notes (Signed)
Increase torsemide to 40 qam/20 qpm, BMET 1 week.

## 2020-03-11 ENCOUNTER — Encounter: Payer: Self-pay | Admitting: Family Medicine

## 2020-03-11 ENCOUNTER — Other Ambulatory Visit: Payer: Self-pay

## 2020-03-11 ENCOUNTER — Telehealth: Payer: Self-pay | Admitting: *Deleted

## 2020-03-11 ENCOUNTER — Ambulatory Visit (INDEPENDENT_AMBULATORY_CARE_PROVIDER_SITE_OTHER): Payer: Medicare Other | Admitting: Family Medicine

## 2020-03-11 VITALS — BP 122/56 | HR 85 | Temp 98.1°F | Wt 144.2 lb

## 2020-03-11 DIAGNOSIS — E039 Hypothyroidism, unspecified: Secondary | ICD-10-CM | POA: Diagnosis not present

## 2020-03-11 DIAGNOSIS — K922 Gastrointestinal hemorrhage, unspecified: Secondary | ICD-10-CM | POA: Diagnosis not present

## 2020-03-11 DIAGNOSIS — I428 Other cardiomyopathies: Secondary | ICD-10-CM

## 2020-03-11 DIAGNOSIS — I5032 Chronic diastolic (congestive) heart failure: Secondary | ICD-10-CM

## 2020-03-11 DIAGNOSIS — I482 Chronic atrial fibrillation, unspecified: Secondary | ICD-10-CM

## 2020-03-11 MED ORDER — LEVOTHYROXINE SODIUM 137 MCG PO TABS: 137.0000 ug | ORAL_TABLET | Freq: Every day | ORAL | 3 refills | Status: DC

## 2020-03-11 NOTE — Progress Notes (Signed)
Subjective:    Patient ID: Morgan Clay, female    DOB: 01/26/1946, 74 y.o.   MRN: [id number]  No chief complaint on file.   HPI Pt is a 74 yo female with pmh sig for HTN, afib on Eliquis, chronic diastolic CHF, ICD in place, COPD, CKD 4, depression, insomnia, Hep C, cirrhosis, osteopenia, gout, hypothyroidism was seen today for HFU.  Patient admitted 10/25-10/28/2021 for SOB and anemia 2/2 GIB.  Hgb in ED was 6.9.  GI consulted.  Pt txf 2 u p RBCs on 10/25.  Trace esophageal varices, small superficial AVMs in prepyloric stomach with other punctate vascular lesions treated with APC, and benign duodenal polyp noted on small bowel enteroscopy.  GIB thought 2/2 jejunal AVM vs early GAVE vs portal gastropathy.  Pt given IV Feraheme on 10/27.  D/c'd home on protonix 40 mg daily x 1 mo, ferrous sulfate 325 mg daily,and given ok to restart eliquis 5 mg BID.  Of note, pt underwent cardioversion for A. fib on 02/12/2020.  Since d/c, pt states she is getting better, stools improving.  Endorses mild fatigue, but improving.  Pt denies BRBPR, LE edema, SOB, CP, dizziness, constipation.  Has f/u appt with GI and Cardiology next wk.  Requesting refill on synthroid 137 mcg.   Past Medical History:  Diagnosis Date  . A-fib ([place])    on Eliquis  . AICD (automatic cardioverter/defibrillator) present 10/06/2015  . Anemia   . ARF (acute renal failure) ([place])   . Arthritis    "qwhere" (01/03/2018)  . Asthma    reports mild asthma since childhood - had COPD on dx list from prior PCP  . Chronic bronchitis ([place])    "get it most years; not this past year though" (01/03/2018)  . Chronic kidney disease    "? stage;  followed by [facility]; [name]" (01/03/2018)  . Chronic systolic congestive heart failure ([place])   . DEPRESSION 12/17/2009   Annotation: PHQ-9 score = 14 done on 12/17/2009 Qualifier: Diagnosis of  By: Hassell Done FNP, Tori Milks    . Diabetes mellitus without complication (HCC)    DIET CONTROLLED   .  Diverticulosis   . Enteritis   . FIBROIDS, UTERUS 03/05/2008  . GI bleeding 01/03/2018  . Glaucoma   . Gout   . Hepatitis C    HEPATITIS C - s/p treatment with Harvoni, saw hepatology, Dawn Drazek  . History of blood transfusion ~ 11/2017  . Hyperlipemia 12/06/2012  . HYPERTENSION, BENIGN 04/24/2007  . Hypothyroidism   . LBBB (left bundle branch block)   . Lower GI bleeding    "been dealing w/it since 07/2017" (01/03/2018)  . Nonischemic cardiomyopathy ([place])   . OBESITY 05/27/2009  . OBSTRUCTIVE SLEEP APNEA 11/14/2007   no CPAP  . OSTEOPENIA 09/30/2008  . Pain and swelling of left upper extremity 08/08/2017  . Personal history of other infectious and parasitic disease    Hepatitis B  . Pneumonia    "several times" (01/03/2018)  . Pulmonary edema, acute ([place]) 08/09/2017    No Known Allergies  ROS General: Denies fever, chills, night sweats, changes in weight, changes in appetite  +fatigue HEENT: Denies headaches, ear pain, changes in vision, rhinorrhea, sore throat CV: Denies CP, palpitations, SOB, orthopnea Pulm: Denies SOB, cough, wheezing GI: Denies abdominal pain, nausea, vomiting, diarrhea, constipation GU: Denies dysuria, hematuria, frequency, vaginal discharge Msk: Denies muscle cramps, joint pains Neuro: Denies weakness, numbness, tingling Skin: Denies rashes, bruising Psych: Denies depression, anxiety, hallucinations    Objective:  Blood pressure (!) 122/56, pulse 85, temperature 98.1 F (36.7 C), temperature source Oral, weight 144 lb 3.2 oz (65.4 kg), SpO2 98 %.  Gen. Pleasant, well-nourished, in no distress, flat affect HEENT: [place]/AT, face symmetric, conjunctiva clear, no scleral icterus, PERRLA, EOMI, nares patent without drainage Lungs: no accessory muscle use, CTAB, no wheezes or rales Cardiovascular: RRR, no m/r/g, no peripheral edema Musculoskeletal: No deformities, no cyanosis or clubbing, normal tone Neuro:  A&Ox3, CN II-XII intact, normal gait Skin:   Warm, no lesions/ rash   Wt Readings from Last 3 Encounters:  03/03/20 144 lb 13.5 oz (65.7 kg)  03/01/20 145 lb (65.8 kg)  02/12/20 140 lb (63.5 kg)    Lab Results  Component Value Date   WBC 6.5 03/09/2020   HGB 9.9 (L) 03/09/2020   HCT 30.4 (L) 03/09/2020   PLT 253.0 03/09/2020   GLUCOSE 91 03/04/2020   CHOL 89 07/23/2019   TRIG 97.0 07/23/2019   HDL 36.40 (L) 07/23/2019   LDLCALC 33 07/23/2019   ALT 13 03/01/2020   AST 23 03/01/2020   NA 135 03/04/2020   K 3.9 03/04/2020   CL 104 03/04/2020   CREATININE 2.23 (H) 03/04/2020   BUN 43 (H) 03/04/2020   CO2 20 (L) 03/04/2020   TSH 3.11 02/04/2020   INR 1.3 (H) 12/05/2019   HGBA1C 6.1 (H) 02/04/2020   MICROALBUR 7.7 (H) 11/24/2013    Assessment/Plan:  Gastrointestinal hemorrhage, unspecified gastrointestinal hemorrhage type -stable, improving -thought 2/2 jejunal AVM vs early GAVE, vs gastropathy. -s/p 2 u pRBCs and feraheme infusion for Hgb 6.9 on 03/01/20.   -Hgb 9.9 on 03/09/20  -continue ferrous sulfate 325 mg -ok to use Miralax if needed for constipation. -Ok to continue Eliquis 5 mg BID. -encouraged to keep f/u apt with GI -Given precautions  Chronic diastolic CHF (congestive heart failure) ([place]) -Echo 12/29/2019: EF 50-55% with grade 2 diastolic dysfunction, moderate AV regurg, LA severely dilated -Continue current medications including spironolactone 12.5 mg daily, torsemide 40 mg every morning and 20 mg in p.m., BiDil 20-37.5 mg take 1 tablet 3 times daily, Coreg 25 mg twice daily -continue lifestyle modifications -Continue follow-up with cardiology  Nonischemic cardiomyopathy ([place]) -Biventricular ICD in place -Echo 12/29/2019: EF 50-55% with grade 2 diastolic dysfunction, moderate AV regurg., LA severely dilated -Continue current meds -Continue lifestyle modifications -Continue follow-up with cardiology  Acquired hypothyroidism  -TSH 3.11 on 02/04/2020 -Continue Synthroid 137 mcg daily - Plan:  levothyroxine (SYNTHROID) 137 MCG tablet  Atrial fibrillation, chronic -s/p cardioversion 02/12/2020 -Continue amiodarone 200 mg twice daily, Eliquis 5 mg twice daily, Coreg 25 mg twice daily -Continue follow-up with cardiology  F/u prn  Grier Mitts, MD

## 2020-03-11 NOTE — Telephone Encounter (Signed)
Patient called requesting to speak with Dr Volanda Napoleon nurse, patient states it is in regards to get a aid. Patient provided a number to the insurance to request an aid ([phone]. patient states she just needs the nurse to call her

## 2020-03-15 ENCOUNTER — Encounter (HOSPITAL_COMMUNITY): Payer: Self-pay | Admitting: Physician Assistant

## 2020-03-15 ENCOUNTER — Other Ambulatory Visit: Payer: Self-pay

## 2020-03-15 ENCOUNTER — Ambulatory Visit (HOSPITAL_COMMUNITY)
Admission: RE | Admit: 2020-03-15 | Discharge: 2020-03-15 | Disposition: A | Discharge status: Home / Self Care | Payer: Medicare Other | Source: Ambulatory Visit | Attending: Physician Assistant | Admitting: Physician Assistant

## 2020-03-15 VITALS — BP 136/70 | HR 62 | Ht 59.0 in | Wt 140.8 lb

## 2020-03-15 DIAGNOSIS — D6869 Other thrombophilia: Secondary | ICD-10-CM | POA: Diagnosis not present

## 2020-03-15 DIAGNOSIS — I4819 Other persistent atrial fibrillation: Secondary | ICD-10-CM | POA: Insufficient documentation

## 2020-03-15 DIAGNOSIS — Z7901 Long term (current) use of anticoagulants: Secondary | ICD-10-CM | POA: Diagnosis not present

## 2020-03-15 DIAGNOSIS — Z79890 Hormone replacement therapy (postmenopausal): Secondary | ICD-10-CM | POA: Insufficient documentation

## 2020-03-15 DIAGNOSIS — Z95810 Presence of automatic (implantable) cardiac defibrillator: Secondary | ICD-10-CM | POA: Insufficient documentation

## 2020-03-15 DIAGNOSIS — I428 Other cardiomyopathies: Secondary | ICD-10-CM | POA: Diagnosis not present

## 2020-03-15 DIAGNOSIS — I10 Essential (primary) hypertension: Secondary | ICD-10-CM | POA: Diagnosis not present

## 2020-03-15 DIAGNOSIS — I484 Atypical atrial flutter: Secondary | ICD-10-CM | POA: Diagnosis not present

## 2020-03-15 DIAGNOSIS — Z79899 Other long term (current) drug therapy: Secondary | ICD-10-CM | POA: Insufficient documentation

## 2020-03-15 MED ORDER — AMIODARONE HCL 200 MG PO TABS: 200.0000 mg | ORAL_TABLET | Freq: Every day | ORAL | Status: DC

## 2020-03-15 NOTE — Progress Notes (Signed)
Primary Care Physician: Billie Ruddy, MD Primary Cardiologist: Dr [name] Primary Electrophysiologist: Dr Caryl Comes Referring Physician: Dr Caryl Comes   Morgan Clay is a 74 y.o. female with a history of NICM s/p CRT-D, persistent atrial fibrillation, prior GI bleeding, HTN, DM, CKD stage III, atrial flutter who presents for follow up in the [facility]. Patient is on Eliquis for a CHADS2VASC score of 5. She was noted to be out of rhythm by remote device check 01/19/20. She has had symptoms of SOB, fatigue, and generalized weakness. There were no specific triggers that she could identify.   On follow up today, patient is s/p DCCV on 02/12/20. She was back in afib on 02/20/20 per device report. She was hospitalized 10/25-10/28/21 with GI bleeding, Hgb was 6.9. She received 2 units PRBC. Enteroscopy showed small superficial AVMs and trace esophageal varices. GI cleared her to resume Eliquis 03/03/20. She reports she has felt well since then with more energy despite being out of rhythm. Her torsemide has been increased recently for possible fluid accumulation on Optivol. She has not noted any recent bleeding issues since leaving the hospital.   Today, she denies symptoms of palpitations, chest pain, orthopnea, PND, lower extremity edema, dizziness, presyncope, syncope, snoring, daytime somnolence, or neurologic sequela. The patient is tolerating medications without difficulties and is otherwise without complaint today.    Atrial Fibrillation Risk Factors:  she does not have symptoms or diagnosis of sleep apnea. she does not have a history of rheumatic fever. she does not have a history of alcohol use. The patient does not have a history of early familial atrial fibrillation or other arrhythmias.  she has a BMI of Body mass index is 28.44 kg/m.[name] Filed Weights   03/15/20 0957  Weight: 63.9 kg    Family History  Problem Relation Age of Onset  . Asthma Father   .  Heart attack Father   . Asthma Sister   . Lung cancer Sister   . Heart attack Mother   . Stroke Brother   . Colon cancer Neg Hx   . Esophageal cancer Neg Hx   . Pancreatic cancer Neg Hx   . Stomach cancer Neg Hx   . Liver disease Neg Hx      Atrial Fibrillation Management history:  Previous antiarrhythmic drugs: amiodarone Previous cardioversions: 2016, 2018, 2019, 02/12/20 Previous ablations: none CHADS2VASC score: 5 Anticoagulation history: Eliquis   Past Medical History:  Diagnosis Date  . A-fib ([place])    on Eliquis  . AICD (automatic cardioverter/defibrillator) present 10/06/2015  . Anemia   . ARF (acute renal failure) ([place])   . Arthritis    "qwhere" (01/03/2018)  . Asthma    reports mild asthma since childhood - had COPD on dx list from prior PCP  . Chronic bronchitis ([place])    "get it most years; not this past year though" (01/03/2018)  . Chronic kidney disease    "? stage;  followed by [facility]; [name]" (01/03/2018)  . Chronic systolic congestive heart failure ([place])   . DEPRESSION 12/17/2009   Annotation: PHQ-9 score = 14 done on 12/17/2009 Qualifier: Diagnosis of  By: Hassell Done FNP, Tori Milks    . Diabetes mellitus without complication (HCC)    DIET CONTROLLED   . Diverticulosis   . Enteritis   . FIBROIDS, UTERUS 03/05/2008  . GI bleeding 01/03/2018  . Glaucoma   . Gout   . Hepatitis C    HEPATITIS C - s/p treatment with  Harvoni, saw hepatology, Dawn Drazek  . History of blood transfusion ~ 11/2017  . Hyperlipemia 12/06/2012  . HYPERTENSION, BENIGN 04/24/2007  . Hypothyroidism   . LBBB (left bundle branch block)   . Lower GI bleeding    "been dealing w/it since 07/2017" (01/03/2018)  . Nonischemic cardiomyopathy ([place])   . OBESITY 05/27/2009  . OBSTRUCTIVE SLEEP APNEA 11/14/2007   no CPAP  . OSTEOPENIA 09/30/2008  . Pain and swelling of left upper extremity 08/08/2017  . Personal history of other infectious and parasitic disease    Hepatitis B  .  Pneumonia    "several times" (01/03/2018)  . Pulmonary edema, acute ([place]) 08/09/2017   Past Surgical History:  Procedure Laterality Date  . APPENDECTOMY    . CARDIAC CATHETERIZATION    . CARDIOVERSION N/A 12/14/2014   Procedure: CARDIOVERSION;  Surgeon: Dorothy Spark, MD;  Location: [facility] ENDOSCOPY;  Service: Cardiovascular;  Laterality: N/A;  . CARDIOVERSION N/A 02/26/2017   Procedure: CARDIOVERSION;  Surgeon: Evans Lance, MD;  Location: [place] CV LAB;  Service: Cardiovascular;  Laterality: N/A;  . CARDIOVERSION N/A 07/24/2017   Procedure: CARDIOVERSION;  Surgeon: Thayer Headings, MD;  Location: [facility] ENDOSCOPY;  Service: Cardiovascular;  Laterality: N/A;  . CARDIOVERSION N/A 02/12/2020   Procedure: CARDIOVERSION;  Surgeon: [name], MD;  Location: [facility] ENDOSCOPY;  Service: Cardiovascular;  Laterality: N/A;  . COLONOSCOPY N/A 11/08/2017   Procedure: COLONOSCOPY;  Surgeon: Milus Banister, MD;  Location: [facility] ENDOSCOPY;  Service: Endoscopy;  Laterality: N/A;  . DILATION AND CURETTAGE OF UTERUS    . ENTEROSCOPY N/A 03/02/2020   Procedure: ENTEROSCOPY;  Surgeon: Yetta Flock, MD;  Location: [facility] ENDOSCOPY;  Service: Gastroenterology;  Laterality: N/A;  . EP IMPLANTABLE DEVICE N/A 10/06/2015   Procedure: BIV ICD Generator Changeout;  Surgeon: [name], MD;  Location: [place] CV LAB;  Service: Cardiovascular;  Laterality: N/A;  . ESOPHAGOGASTRODUODENOSCOPY N/A 10/26/2017   Procedure: ESOPHAGOGASTRODUODENOSCOPY (EGD);  Surgeon: Ladene Artist, MD;  Location: [facility] ENDOSCOPY;  Service: Endoscopy;  Laterality: N/A;  . ESOPHAGOGASTRODUODENOSCOPY (EGD) WITH PROPOFOL N/A 11/07/2017   Procedure: ESOPHAGOGASTRODUODENOSCOPY (EGD) WITH PROPOFOL;  Surgeon: Milus Banister, MD;  Location: [facility] ENDOSCOPY;  Service: Endoscopy;  Laterality: N/A;  . HOT HEMOSTASIS N/A 10/26/2017   Procedure: HOT HEMOSTASIS (ARGON PLASMA COAGULATION/BICAP);  Surgeon: Ladene Artist, MD;  Location: [facility] ENDOSCOPY;   Service: Endoscopy;  Laterality: N/A;  . HOT HEMOSTASIS N/A 03/02/2020   Procedure: HOT HEMOSTASIS (ARGON PLASMA COAGULATION/BICAP);  Surgeon: Yetta Flock, MD;  Location: [facility] ENDOSCOPY;  Service: Gastroenterology;  Laterality: N/A;  . INSERT / REPLACE / REMOVE PACEMAKER  ?2008  . POLYPECTOMY  11/08/2017   Procedure: POLYPECTOMY;  Surgeon: Milus Banister, MD;  Location: [facility] ENDOSCOPY;  Service: Endoscopy;;  . PROCTOSCOPY N/A 05/22/2018   Procedure: RIGID PROCTOSCOPY;  Surgeon: Michael Boston, MD;  Location: [facility] ORS;  Service: General;  Laterality: N/A;  . RIGHT HEART CATH N/A 02/13/2018   Procedure: RIGHT HEART CATH;  Surgeon: [name], MD;  Location: [place] CV LAB;  Service: Cardiovascular;  Laterality: N/A;  . RIGHT/LEFT HEART CATH AND CORONARY ANGIOGRAPHY N/A 12/11/2019   Procedure: RIGHT/LEFT HEART CATH AND CORONARY ANGIOGRAPHY;  Surgeon: [name], MD;  Location: [place] CV LAB;  Service: Cardiovascular;  Laterality: N/A;  . SUBMUCOSAL TATTOO INJECTION  03/02/2020   Procedure: SUBMUCOSAL TATTOO INJECTION;  Surgeon: Yetta Flock, MD;  Location: [facility] ENDOSCOPY;  Service: Gastroenterology;;  . TUBAL LIGATION    .  XI ROBOTIC ASSISTED LOWER ANTERIOR RESECTION N/A 05/22/2018   Procedure: XI ROBOTIC ASSISTED SIGMOID COLOECTOMY MOBILIZATION OF SPLENIC FLEXURE, FIREFLY ASSESSMENT OF PERFUSION;  Surgeon: Michael Boston, MD;  Location: [facility] ORS;  Service: General;  Laterality: N/A;  ERAS PATHWAY    Current Outpatient Medications  Medication Sig Dispense Refill  . allopurinol (ZYLOPRIM) 300 MG tablet Take 300 mg by mouth daily.     [name] amiodarone (PACERONE) 200 MG tablet Take 1 tablet (200 mg total) by mouth daily.    [name] apixaban (ELIQUIS) 5 MG TABS tablet Take 1 tablet (5 mg total) by mouth 2 (two) times daily. 60 tablet 11  . calcitRIOL (ROCALTROL) 0.25 MCG capsule Take 0.25 mcg by mouth 3 (three) times a week.    . carvedilol (COREG) 25 MG tablet Take 25 mg by mouth 2  (two) times daily with a meal.    . colchicine 0.6 MG tablet Take 0.6 mg by mouth daily as needed (gout flares).   0  . ferrous sulfate 325 (65 FE) MG tablet Take 1 tablet (325 mg total) by mouth daily. 90 tablet 3  . gabapentin (NEURONTIN) 300 MG capsule Take 1 capsule (300 mg total) by mouth 2 (two) times daily. 180 capsule 2  . isosorbide-hydrALAZINE (BIDIL) 20-37.5 MG tablet Take 1 tablet by mouth 3 (three) times daily. 270 tablet 2  . levothyroxine (SYNTHROID) 137 MCG tablet Take 1 tablet (137 mcg total) by mouth daily before breakfast. 90 tablet 3  . nitroGLYCERIN (NITROSTAT) 0.4 MG SL tablet Place 0.4 mg under the tongue every 5 (five) minutes as needed for chest pain.    . pantoprazole (PROTONIX) 40 MG tablet Take 1 tablet (40 mg total) by mouth daily at 6 (six) AM. 30 tablet 0  . sertraline (ZOLOFT) 50 MG tablet Take 50 mg by mouth daily.    [name] spironolactone (ALDACTONE) 25 MG tablet Take 0.5 tablets (12.5 mg total) by mouth daily. 45 tablet 3  . torsemide (DEMADEX) 20 MG tablet Take 2 tablets (40 mg total) by mouth every morning and 1 tablet (20 mg total) every evening. 270 tablet 3  . traZODone (DESYREL) 50 MG tablet Take 50 mg by mouth at bedtime.     No current facility-administered medications for this encounter.    No Known Allergies  Social History   Socioeconomic History  . Marital status: Divorced    Spouse name: Not on file  . Number of children: 1  . Years of education: 49  . Highest education level: Not on file  Occupational History  . Not on file  Tobacco Use  . Smoking status: Never Smoker  . Smokeless tobacco: Never Used  Vaping Use  . Vaping Use: Never used  Substance and Sexual Activity  . Alcohol use: Yes    Comment: 01/03/2018 "couple drinks/month"  . Drug use: Yes    Types: Marijuana    Comment: VERY INTERMITTENT   . Sexual activity: Not Currently  Other Topics Concern  . Not on file  Social History Narrative   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      Home Situation: lives alone      Spiritual Beliefs: Christian - Baptist      Lifestyle: no regular exercise; poor diet      She is divorced at least one adult child   Never smoker    rare alcohol to occasional at best    no substance abuse         Social  Determinants of Health   Financial Resource Strain:   . Difficulty of Paying Living Expenses: Not on file  Food Insecurity:   . Worried About Charity fundraiser in the Last Year: Not on file  . Ran Out of Food in the Last Year: Not on file  Transportation Needs:   . Lack of Transportation (Medical): Not on file  . Lack of Transportation (Non-Medical): Not on file  Physical Activity:   . Days of Exercise per Week: Not on file  . Minutes of Exercise per Session: Not on file  Stress:   . Feeling of Stress : Not on file  Social Connections:   . Frequency of Communication with Friends and Family: Not on file  . Frequency of Social Gatherings with Friends and Family: Not on file  . Attends Religious Services: Not on file  . Active Member of Clubs or Organizations: Not on file  . Attends Archivist Meetings: Not on file  . Marital Status: Not on file  Intimate Partner Violence:   . Fear of Current or Ex-Partner: Not on file  . Emotionally Abused: Not on file  . Physically Abused: Not on file  . Sexually Abused: Not on file     ROS- All systems are reviewed and negative except as per the HPI above.  Physical Exam: Vitals:   03/15/20 0957  BP: 136/70  Pulse: 62  Weight: 63.9 kg  Height: 4\' 11"  (1.499 m)    GEN- The patient is well appearing, alert and oriented x 3 today.   HEENT-head normocephalic, atraumatic, sclera clear, conjunctiva pink, hearing intact, trachea midline. Lungs- Clear to ausculation bilaterally, normal work of breathing Heart- Regular rate and rhythm, no murmurs, rubs or gallops  GI- soft, NT, ND, + BS Extremities- no clubbing, cyanosis, or edema MS- no significant  deformity or atrophy Skin- no rash or lesion Psych- euthymic mood, full affect Neuro- strength and sensation are intact   Wt Readings from Last 3 Encounters:  03/15/20 63.9 kg  03/11/20 65.4 kg  03/03/20 65.7 kg    EKG today demonstrates V paced rhythm with underlying atrial flutter HR 62, QRS 144, QTc 527  Echo 12/29/19 demonstrated  1. Left ventricular ejection fraction, by estimation, is 50 to 55%. The  left ventricle has low normal function. The left ventricle has no regional  wall motion abnormalities. There is moderate left ventricular hypertrophy.  Left ventricular diastolic  parameters are consistent with Grade II diastolic dysfunction  (pseudonormalization).  2. Right ventricular systolic function is mildly reduced. The right  ventricular size is normal. There is normal pulmonary artery systolic  pressure. The estimated right ventricular systolic pressure is 88.5 mmHg.  3. Left atrial size was severely dilated.  4. The mitral valve is normal in structure. Mild mitral valve  regurgitation. No evidence of mitral stenosis.  5. The aortic valve is tricuspid. Aortic valve regurgitation is moderate.  No aortic stenosis is present.  6. No holodiastolic flow reversal in the descending thoracic aorta  doppler signal.  7. The inferior vena cava is normal in size with greater than 50%  respiratory variability, suggesting right atrial pressure of 3 mmHg.   Epic records are reviewed at length today  CHA2DS2-VASc Score = 5  The patient's score is based upon: CHF History: 1 HTN History: 1 Diabetes History: 1 Stroke History: 0 Vascular Disease History: 0      ASSESSMENT AND PLAN: 1. Persistent Atrial Fibrillation/atypical atrial flutter The patient's CHA2DS2-VASc score is  5, indicating a 7.2% annual risk of stroke.   S/p DCCV on 02/12/20. Back in persistent afib, suspect contributing to fluid status.  She has loaded on amiodarone. Will decrease amiodarone to 200 mg  daily now.  Will arrange for repeat DCCV with Dr [name] on or after 11/17 to allow 3 weeks of uninterrupted anticoagulation.  Continue Eliquis 5 mg BID Continue Coreg 25 mg BID  2. Secondary Hypercoagulable State (ICD10:  D68.69) The patient is at significant risk for stroke/thromboembolism based upon her CHA2DS2-VASc Score of 5.  Continue Apixaban (Eliquis).   3. NICM S/p CRT-D, followed in the [facility]. No overt signs or symptoms of fluid overload today. Followed in the [facility]  4. HTN Stable, no changes today.   Follow up with [facility] as scheduled.    [facility] [address] [place], [place] [address] [phone] 03/15/2020 10:42 AM

## 2020-03-15 NOTE — Patient Instructions (Signed)
Cardioversion scheduled for  Friday, November 19th  - Arrive at the [facility] and go to admitting at [facility] not eat or drink anything after midnight the night prior to your procedure.  - Take all your morning medication (except diabetic medications) with a sip of water prior to arrival.  - You will not be able to drive home after your procedure.  - Do NOT miss any doses of your blood thinner - if you should miss a dose please notify our office immediately.  - If you feel as if you go back into normal rhythm prior to scheduled cardioversion, please notify our office immediately. If your procedure is canceled in the cardioversion suite you will be charged a cancellation fee.

## 2020-03-16 ENCOUNTER — Ambulatory Visit (INDEPENDENT_AMBULATORY_CARE_PROVIDER_SITE_OTHER): Payer: Medicare Other

## 2020-03-16 DIAGNOSIS — I5032 Chronic diastolic (congestive) heart failure: Secondary | ICD-10-CM

## 2020-03-16 DIAGNOSIS — Z95810 Presence of automatic (implantable) cardiac defibrillator: Secondary | ICD-10-CM

## 2020-03-16 NOTE — Progress Notes (Signed)
EPIC Encounter for ICM Monitoring  Patient Name: Morgan Clay is a 74 y.o. female Date: 03/16/2020 Primary Care Physican: Billie Ruddy, MD Primary Cardiologist:[place] Electrophysiologist: Caryl Comes Nephrologist: [name] Bi-V Pacing:100% 11/2/2021OfficeWeight:148lbs  Clinical Status (08-Mar-2020 to 16-Mar-2020) Time in AT/AF 24.0 hr/day (100.0%) Longest AT/AF 22 days   Spoke with patient and she thinks she lost weight since losing fluid but does not weigh at home.  Cardioversion 04/25/2020.  Optivol thoracic impedancesuggestingfluid levels returned to normal after taking increased Torsemide dosage starting 03/10/2020  Prescribed:  Torsemide20 mgTake2tablets (40 mg total)daily and 1 tablet every afternoon.  Spironolactone 25 mg take 0.5 tablet (12.5 mg total) daily  Eliquis 5 mg take 1 tablet twice a day  Labs:  BMET scheduled 03/17/2020 03/04/2020 Creatinine 2.23, BUN 43, Potassium 3.9, Sodium 135, GFR 23 03/03/2020 Creatinine 2.18, BUN 46, Potassium 3.8, Sodium 136, GFR 23  03/02/2020 Creatinine 2.17, BUN 52, Potassium 3.7, Sodium 138, GFR 24  03/01/2020 Creatinine 2.23, BUN 53, Potassium 4.3, Sodium 137, GFR 23  02/12/2020 Creatinine 2.80, BUN 75, Potassium 4.6, Sodium 137  02/04/2020 Creatinine 2.24, BUN 60, Potassium 4.5, Sodium 135  01/06/2020 Creatinine 2.57, BUN 69, Potassium 4.9, Sodium 130, GFR 18-21 12/29/2019 Creatinine 2.88, BUN 80, Potassium 3.9, Sodium 132, GFR 16-18 A complete set of results can be found in Results Review.  Recommendations:  No changes and encouraged to call if experiencing any fluid symptoms.   Follow-up plan: ICM clinic phone appointment on12/11/2019. 91 day device clinic remote transmission12/28/2021.  EP/Cardiology Office Visits:04/06/2020 with [facility] PA/NP.Last EP telehealth visit was 07/24/2019 (no recall)  Copy of ICM check sent to [facility].   3 month ICM trend:  03/16/2020    1 Year ICM trend:       [name], RN 03/16/2020 5:04 PM

## 2020-03-17 ENCOUNTER — Other Ambulatory Visit: Payer: Self-pay

## 2020-03-17 ENCOUNTER — Ambulatory Visit (HOSPITAL_COMMUNITY)
Admission: RE | Admit: 2020-03-17 | Discharge: 2020-03-17 | Disposition: A | Discharge status: Home / Self Care | Payer: Medicare Other | Source: Ambulatory Visit | Attending: Internal Medicine | Admitting: Internal Medicine

## 2020-03-17 DIAGNOSIS — I5032 Chronic diastolic (congestive) heart failure: Secondary | ICD-10-CM | POA: Insufficient documentation

## 2020-03-17 LAB — BASIC METABOLIC PANEL
Anion gap: 14 (ref 5–15)
BUN: 71 mg/dL — ABNORMAL HIGH (ref 8–23)
CO2: 20 mmol/L — ABNORMAL LOW (ref 22–32)
Calcium: 9.9 mg/dL (ref 8.9–10.3)
Chloride: 99 mmol/L (ref 98–111)
Creatinine, Ser: 3.05 mg/dL — ABNORMAL HIGH (ref 0.44–1.00)
GFR, Estimated: 16 mL/min — ABNORMAL LOW (ref >60)
Glucose, Bld: 98 mg/dL (ref 70–99)
Potassium: 4.2 mmol/L (ref 3.5–5.1)
Sodium: 133 mmol/L — ABNORMAL LOW (ref 135–145)

## 2020-03-17 NOTE — Telephone Encounter (Signed)
Spoke with Sharyn Lull with [place] [facility] got pt information, state that their agency will contact pt to review qualifications or insurance coverage for the Aid service. Spoke with pt and is aware she should receive a call from [place]. Verbalized understanding

## 2020-03-19 ENCOUNTER — Telehealth (HOSPITAL_COMMUNITY): Payer: Self-pay

## 2020-03-19 DIAGNOSIS — I428 Other cardiomyopathies: Secondary | ICD-10-CM

## 2020-03-19 MED ORDER — TORSEMIDE 20 MG PO TABS: 40.0000 mg | ORAL_TABLET | Freq: Every day | ORAL | 3 refills | Status: DC

## 2020-03-19 NOTE — Telephone Encounter (Signed)
Malena Edman, RN  03/19/2020 1:19 PM EST Back to Top    Patient advised and verbalized understanding. Med list updated. Lab orders placed

## 2020-03-19 NOTE — Telephone Encounter (Signed)
-----   Message from [name], MD sent at 03/18/2020  4:17 PM EST ----- Repeat BMET 1 week.

## 2020-03-23 ENCOUNTER — Telehealth: Payer: Self-pay | Admitting: Family Medicine

## 2020-03-23 NOTE — Telephone Encounter (Signed)
Pt call and stated that she want dr.Banks to know that she haven't heard anything from [place].

## 2020-03-24 ENCOUNTER — Other Ambulatory Visit (HOSPITAL_COMMUNITY)
Admission: RE | Admit: 2020-03-24 | Discharge: 2020-03-24 | Disposition: A | Discharge status: Home / Self Care | Payer: Medicare Other | Source: Ambulatory Visit | Attending: Cardiology | Admitting: Cardiology

## 2020-03-24 DIAGNOSIS — Z01812 Encounter for preprocedural laboratory examination: Secondary | ICD-10-CM | POA: Insufficient documentation

## 2020-03-24 DIAGNOSIS — Z20822 Contact with and (suspected) exposure to covid-19: Secondary | ICD-10-CM | POA: Insufficient documentation

## 2020-03-24 LAB — SARS CORONAVIRUS 2 (TAT 6-24 HRS): SARS Coronavirus 2: NEGATIVE

## 2020-03-24 NOTE — Telephone Encounter (Signed)
Returned pt call but voicemail on pt phone has not been set up, Not able to leave a message

## 2020-03-26 ENCOUNTER — Ambulatory Visit (HOSPITAL_COMMUNITY)
Admission: RE | Admit: 2020-03-26 | Discharge: 2020-03-26 | Disposition: A | Discharge status: Home / Self Care | Payer: Medicare Other | Source: Ambulatory Visit | Attending: Cardiology | Admitting: Cardiology

## 2020-03-26 ENCOUNTER — Ambulatory Visit (HOSPITAL_COMMUNITY): Payer: Medicare Other | Admitting: Anesthesiology

## 2020-03-26 ENCOUNTER — Encounter (HOSPITAL_COMMUNITY)
Admission: RE | Disposition: A | Discharge status: Home / Self Care | Payer: Medicare Other | Source: Ambulatory Visit | Attending: Cardiology

## 2020-03-26 ENCOUNTER — Other Ambulatory Visit: Payer: Self-pay

## 2020-03-26 ENCOUNTER — Encounter (HOSPITAL_COMMUNITY): Payer: Self-pay | Admitting: Cardiology

## 2020-03-26 ENCOUNTER — Other Ambulatory Visit (HOSPITAL_COMMUNITY): Payer: Self-pay

## 2020-03-26 DIAGNOSIS — I4891 Unspecified atrial fibrillation: Secondary | ICD-10-CM | POA: Diagnosis not present

## 2020-03-26 DIAGNOSIS — I428 Other cardiomyopathies: Secondary | ICD-10-CM

## 2020-03-26 SURGERY — CANCELLED PROCEDURE
Anesthesia: General

## 2020-03-26 MED ORDER — TORSEMIDE 20 MG PO TABS: 40.0000 mg | ORAL_TABLET | Freq: Every day | ORAL | 3 refills | Status: DC

## 2020-03-26 NOTE — Anesthesia Preprocedure Evaluation (Deleted)
Anesthesia Evaluation    Reviewed: Allergy & Precautions, Patient's Chart, lab work & pertinent test results  Airway        Dental   Pulmonary asthma , sleep apnea , COPD,           Cardiovascular hypertension, +CHF  + dysrhythmias Atrial Fibrillation + Cardiac Defibrillator   1. Left ventricular ejection fraction, by estimation, is 50 to 55%. The  left ventricle has low normal function. The left ventricle has no regional  wall motion abnormalities. There is moderate left ventricular hypertrophy.  Left ventricular diastolic  parameters are consistent with Grade II diastolic dysfunction  (pseudonormalization).  2. Right ventricular systolic function is mildly reduced. The right  ventricular size is normal. There is normal pulmonary artery systolic  pressure. The estimated right ventricular systolic pressure is 64.3 mmHg.  3. Left atrial size was severely dilated.  4. The mitral valve is normal in structure. Mild mitral valve  regurgitation. No evidence of mitral stenosis.  5. The aortic valve is tricuspid. Aortic valve regurgitation is moderate.  No aortic stenosis is present.  6. No holodiastolic flow reversal in the descending thoracic aorta  doppler signal.  7. The inferior vena cava is normal in size with greater than 50%  respiratory variability, suggesting right atrial pressure of 3 mmHg.    Neuro/Psych PSYCHIATRIC DISORDERS Depression negative neurological ROS     GI/Hepatic negative GI ROS, (+) Hepatitis -, C, B  Endo/Other  diabetesHypothyroidism   Renal/GU Renal disease     Musculoskeletal  (+) Arthritis ,   Abdominal   Peds  Hematology   Anesthesia Other Findings   Reproductive/Obstetrics                            Anesthesia Physical Anesthesia Plan  ASA: III  Anesthesia Plan: General   Post-op Pain Management:    Induction: Intravenous  PONV Risk Score and Plan:  0 and Propofol infusion  Airway Management Planned: Natural Airway and Simple Face Mask  Additional Equipment: None  Intra-op Plan:   Post-operative Plan:   Informed Consent:   Plan Discussed with: CRNA  Anesthesia Plan Comments: (Cancelled. NSR   Echo: 1. Left ventricular ejection fraction, by estimation, is 50 to 55%. The  left ventricle has low normal function. The left ventricle has no regional  wall motion abnormalities. There is moderate left ventricular hypertrophy.  Left ventricular diastolic  parameters are consistent with Grade II diastolic dysfunction  (pseudonormalization).  2. Right ventricular systolic function is mildly reduced. The right  ventricular size is normal. There is normal pulmonary artery systolic  pressure. The estimated right ventricular systolic pressure is 32.9 mmHg.  3. Left atrial size was severely dilated.  4. The mitral valve is normal in structure. Mild mitral valve  regurgitation. No evidence of mitral stenosis.  5. The aortic valve is tricuspid. Aortic valve regurgitation is moderate.  No aortic stenosis is present.  6. No holodiastolic flow reversal in the descending thoracic aorta  doppler signal.  7. The inferior vena cava is normal in size with greater than 50%  respiratory variability, suggesting right atrial pressure of 3 mmHg. )       Anesthesia Quick Evaluation

## 2020-03-26 NOTE — Progress Notes (Signed)
Patient presented to scheduled cardioversion on 03/26/2020 in normal sinus rhythm. Medtronic device interrogated and rep confirmed. EKG done. Dr. [name] is aware. Verbal orders given to discharge patient.

## 2020-04-05 ENCOUNTER — Other Ambulatory Visit (HOSPITAL_COMMUNITY): Payer: Self-pay

## 2020-04-05 ENCOUNTER — Telehealth (HOSPITAL_COMMUNITY): Payer: Self-pay | Admitting: Cardiology

## 2020-04-05 DIAGNOSIS — I428 Other cardiomyopathies: Secondary | ICD-10-CM

## 2020-04-05 MED ORDER — TORSEMIDE 20 MG PO TABS: 40.0000 mg | ORAL_TABLET | Freq: Every day | ORAL | 3 refills | Status: DC

## 2020-04-05 NOTE — Telephone Encounter (Signed)
Reminder call complete Patient aware of appt details Reminded to bring medications or updated med list

## 2020-04-06 ENCOUNTER — Ambulatory Visit (HOSPITAL_COMMUNITY)
Admission: RE | Admit: 2020-04-06 | Discharge: 2020-04-06 | Disposition: A | Discharge status: Home / Self Care | Payer: Medicare Other | Source: Ambulatory Visit | Attending: Adult Health | Admitting: Adult Health

## 2020-04-06 ENCOUNTER — Other Ambulatory Visit: Payer: Self-pay

## 2020-04-06 VITALS — BP 97/61 | HR 66 | Wt 140.9 lb

## 2020-04-06 DIAGNOSIS — I480 Paroxysmal atrial fibrillation: Secondary | ICD-10-CM | POA: Diagnosis not present

## 2020-04-06 DIAGNOSIS — Z8711 Personal history of peptic ulcer disease: Secondary | ICD-10-CM | POA: Diagnosis not present

## 2020-04-06 DIAGNOSIS — I4892 Unspecified atrial flutter: Secondary | ICD-10-CM | POA: Insufficient documentation

## 2020-04-06 DIAGNOSIS — E1122 Type 2 diabetes mellitus with diabetic chronic kidney disease: Secondary | ICD-10-CM | POA: Insufficient documentation

## 2020-04-06 DIAGNOSIS — Z7901 Long term (current) use of anticoagulants: Secondary | ICD-10-CM | POA: Diagnosis not present

## 2020-04-06 DIAGNOSIS — N183 Chronic kidney disease, stage 3 unspecified: Secondary | ICD-10-CM | POA: Insufficient documentation

## 2020-04-06 DIAGNOSIS — I2510 Atherosclerotic heart disease of native coronary artery without angina pectoris: Secondary | ICD-10-CM | POA: Diagnosis not present

## 2020-04-06 DIAGNOSIS — I428 Other cardiomyopathies: Secondary | ICD-10-CM

## 2020-04-06 DIAGNOSIS — Z79899 Other long term (current) drug therapy: Secondary | ICD-10-CM | POA: Insufficient documentation

## 2020-04-06 DIAGNOSIS — Z950 Presence of cardiac pacemaker: Secondary | ICD-10-CM | POA: Diagnosis not present

## 2020-04-06 DIAGNOSIS — D649 Anemia, unspecified: Secondary | ICD-10-CM | POA: Insufficient documentation

## 2020-04-06 DIAGNOSIS — I4891 Unspecified atrial fibrillation: Secondary | ICD-10-CM | POA: Insufficient documentation

## 2020-04-06 DIAGNOSIS — D509 Iron deficiency anemia, unspecified: Secondary | ICD-10-CM | POA: Diagnosis not present

## 2020-04-06 DIAGNOSIS — Z8249 Family history of ischemic heart disease and other diseases of the circulatory system: Secondary | ICD-10-CM | POA: Diagnosis not present

## 2020-04-06 DIAGNOSIS — I130 Hypertensive heart and chronic kidney disease with heart failure and stage 1 through stage 4 chronic kidney disease, or unspecified chronic kidney disease: Secondary | ICD-10-CM | POA: Insufficient documentation

## 2020-04-06 DIAGNOSIS — I5042 Chronic combined systolic (congestive) and diastolic (congestive) heart failure: Secondary | ICD-10-CM

## 2020-04-06 LAB — CBC
HCT: 27 % — ABNORMAL LOW (ref 36.0–46.0)
Hemoglobin: 8.6 g/dL — ABNORMAL LOW (ref 12.0–15.0)
MCH: 28.7 pg (ref 26.0–34.0)
MCHC: 31.9 g/dL (ref 30.0–36.0)
MCV: 90 fL (ref 80.0–100.0)
Platelets: 215 10*3/uL (ref 150–400)
RBC: 3 MIL/uL — ABNORMAL LOW (ref 3.87–5.11)
RDW: 21.8 % — ABNORMAL HIGH (ref 11.5–15.5)
WBC: 6.4 10*3/uL (ref 4.0–10.5)
nRBC: 0 % (ref 0.0–0.2)

## 2020-04-06 LAB — BASIC METABOLIC PANEL
Anion gap: 12 (ref 5–15)
BUN: 85 mg/dL — ABNORMAL HIGH (ref 8–23)
CO2: 18 mmol/L — ABNORMAL LOW (ref 22–32)
Calcium: 9.5 mg/dL (ref 8.9–10.3)
Chloride: 102 mmol/L (ref 98–111)
Creatinine, Ser: 3.54 mg/dL — ABNORMAL HIGH (ref 0.44–1.00)
GFR, Estimated: 13 mL/min — ABNORMAL LOW (ref >60)
Glucose, Bld: 112 mg/dL — ABNORMAL HIGH (ref 70–99)
Potassium: 4.3 mmol/L (ref 3.5–5.1)
Sodium: 132 mmol/L — ABNORMAL LOW (ref 135–145)

## 2020-04-06 MED ORDER — PANTOPRAZOLE SODIUM 40 MG PO TBEC: 40.0000 mg | DELAYED_RELEASE_TABLET | Freq: Every day | ORAL | 11 refills | Status: DC

## 2020-04-06 NOTE — Progress Notes (Signed)
PCP: Dr. Colin Benton Cardiology: Dr. Caryl Comes HF Cardiology: Dr. [name]  74 y.o. with history of nonischemic cardiomyopathy with recovery of EF with BiV pacing, recurrent GI bleeding, and paroxysmal atrial flutter was referred by Dr. Caryl Comes for evaluation of CHF.  Patient has a history of presumed nonischemic cardiomyopathy for a number of years.  Echo in 12/15 showed EF 25-30% and Cardiolite at that time showed no ischemia.  She had Medtronic CRT-D system placed, and EF recovered. In 4/19, echo showed EF 60-65%.    She has additionally had problems with GI bleeding.  She had been on Eliquis for atrial flutter but this was stopped with recurrent GI bleeding.  She had bleeding from a gastric ulcer but was also noted to have multiple small bowel AVMs on capsule endoscopy out of reach of endoscope.  Eliquis was stopped. She has had no melena or BRBPR off Eliquis.   Echo in 10/19 showed EF 55-60%, mild-moderate MR, moderate AI, moderate RV dilation.   She was admitted in 1/20 with diverticulitis and ended up having a sigmoid colectomy.    Echo in 9/20 showed EF lower at 45-50%, moderate AI.   She had LHC/RHC in 8/21 with nonobstructive CAD, normal filling pressures, and preserved cardiac output. Echo 8/21 showed EF 50-55%, mild LVH, mildly decreased RV systolic function, moderate aortic insufficiency.  Back in in A fib the end of October. Amio was increased and DC-CV was set up for November 19th.  Chemically converted so DC-CV canceled.    Today she returns for HF follow up. Complaining of fatigue. Mild SOB with exertion.  Denies PND/Orthopnea. Having black stool but takes iron. Appetite ok. No fever or chills. Weight at home has gone down 5 pounds. Taking all medications.   Labs (9/19):  Hgb 10.2, K 3.8, creatinine 2.5 Labs (02/26/2018): K 3.4, creatinine 2.2, WBC 76.  Labs (2/20): K 3.9, creatinine 1.8 Labs (8/20): K 3.8, creatinine 1.98 Labs (3/21): LDL 33 Labs (5/21): K 3.6, creatinine 1.69 Labs  (7/21): K 4, creatinine 1.94 Labs (9/21): K 4.5, creatinine 2.24, TSH normal, HFTs normal  Labs 03/17/2020: K 4.2 Creatinine 3.05   PMH: 1. Diabetes: diet-controlled.  2. Gout 3. HCV: Treated with Harvoni.  4. Atrial flutter: Atypical.  Had DCCV in 3/19, maintained on amiodarone.  5. HTN 6. CKD stage 3: Sees nephrology, [name].  7. H/o SBO 8. Obesity 9. Nonischemic cardiomyopathy: Echo in 12/15 with EF 25-30%.  Cardiolite in 12/15 with no ischemia.  She had a Medtronic CRT-D device placed with recovery of EF.   - Echo (4/19): EF 60-65% with moderate AI, moderate MR, and PASP 49 mmHg.  - RHC 02/13/2018 RA mean 6, RV 49/7,PA 50/18, mean 31,PCWP mean 20, Oxygen saturations:PA 67% AO 97%, Cardiac Output (Fick) 4.6, Cardiac Index (Fick) 2.9, PVR 2.4 WU - Echo (10/19): EF 55-60%, mild-moderate MR, moderate AI, moderate RV dilation, PASP 60 mmHg.  - PYP scan (3/20): H/CL 1.3, grade 1-2 visually.  Unlikely transthyretin cardiac amyloidosis.  - Echo (9/20): EF 45-50%, moderate aortic insufficiency.  - LHC/RHC (8/21): 40% pLAD, 50% D1; mean RA 3, PA 41/16, mean PCWP 14, CI 2.74, PVR 2.99.  - Echo (8/21): EF 50-55%, mild LVH, grade 2 diastolic dysfunction, normal RV size with mildly decreased systolic function, moderate AI, no AS.  10. GI bleeding: Gastric AVM and also gastric ulcer seen in past.  - 8/19 capsule endoscopy showed small bowel AVMs beyond the reach of endoscopy.  - With recurrent episodes of  GI bleeding, Eliquis was stopped.  11. Fe deficiency anemia.  12. Depression 13. OSA: Uses CPAP.  14. Diverticulitis: Sigmoid colectomy in 1/20.   Social History   Socioeconomic History  . Marital status: Divorced    Spouse name: Not on file  . Number of children: 1  . Years of education: 18  . Highest education level: Not on file  Occupational History  . Not on file  Tobacco Use  . Smoking status: Never Smoker  . Smokeless tobacco: Never Used  Vaping Use  . Vaping Use: Never used   Substance and Sexual Activity  . Alcohol use: Yes    Comment: 01/03/2018 "couple drinks/month"  . Drug use: Yes    Types: Marijuana    Comment: VERY INTERMITTENT   . Sexual activity: Not Currently  Other Topics Concern  . Not on file  Social History Narrative   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      Home Situation: lives alone      Spiritual Beliefs: Christian - Baptist      Lifestyle: no regular exercise; poor diet      She is divorced at least one adult child   Never smoker    rare alcohol to occasional at best    no substance abuse         Social Determinants of Radio broadcast assistant Strain:   . Difficulty of Paying Living Expenses: Not on file  Food Insecurity:   . Worried About Charity fundraiser in the Last Year: Not on file  . Ran Out of Food in the Last Year: Not on file  Transportation Needs:   . Lack of Transportation (Medical): Not on file  . Lack of Transportation (Non-Medical): Not on file  Physical Activity:   . Days of Exercise per Week: Not on file  . Minutes of Exercise per Session: Not on file  Stress:   . Feeling of Stress : Not on file  Social Connections:   . Frequency of Communication with Friends and Family: Not on file  . Frequency of Social Gatherings with Friends and Family: Not on file  . Attends Religious Services: Not on file  . Active Member of Clubs or Organizations: Not on file  . Attends Archivist Meetings: Not on file  . Marital Status: Not on file  Intimate Partner Violence:   . Fear of Current or Ex-Partner: Not on file  . Emotionally Abused: Not on file  . Physically Abused: Not on file  . Sexually Abused: Not on file   Family History  Problem Relation Age of Onset  . Asthma Father   . Heart attack Father   . Asthma Sister   . Lung cancer Sister   . Heart attack Mother   . Stroke Brother   . Colon cancer Neg Hx   . Esophageal cancer Neg Hx   . Pancreatic cancer Neg Hx   . Stomach cancer Neg  Hx   . Liver disease Neg Hx    ROS: All systems reviewed and negative except as per HPI.   Current Outpatient Medications  Medication Sig Dispense Refill  . allopurinol (ZYLOPRIM) 300 MG tablet Take 300 mg by mouth daily.     [name] amiodarone (PACERONE) 200 MG tablet Take 1 tablet (200 mg total) by mouth daily.    [name] apixaban (ELIQUIS) 5 MG TABS tablet Take 1 tablet (5 mg total) by mouth 2 (two) times daily. 60 tablet 11  .  calcitRIOL (ROCALTROL) 0.25 MCG capsule Take 0.25 mcg by mouth every Monday, Wednesday, and Friday.     . carvedilol (COREG) 25 MG tablet Take 25 mg by mouth 2 (two) times daily with a meal.    . colchicine 0.6 MG tablet Take 0.6 mg by mouth daily.   0  . ferrous sulfate 325 (65 FE) MG tablet Take 1 tablet (325 mg total) by mouth daily. 90 tablet 3  . gabapentin (NEURONTIN) 300 MG capsule Take 1 capsule (300 mg total) by mouth 2 (two) times daily. 180 capsule 2  . isosorbide-hydrALAZINE (BIDIL) 20-37.5 MG tablet Take 1 tablet by mouth 3 (three) times daily. 270 tablet 2  . levothyroxine (SYNTHROID) 137 MCG tablet Take 1 tablet (137 mcg total) by mouth daily before breakfast. 90 tablet 3  . nitroGLYCERIN (NITROSTAT) 0.4 MG SL tablet Place 0.4 mg under the tongue every 5 (five) minutes x 3 doses as needed for chest pain.     [name] sertraline (ZOLOFT) 50 MG tablet Take 50 mg by mouth daily.    [name] spironolactone (ALDACTONE) 25 MG tablet Take 0.5 tablets (12.5 mg total) by mouth daily. 45 tablet 3  . torsemide (DEMADEX) 20 MG tablet Take 2 tablets (40 mg total) by mouth daily. 60 tablet 3  . traZODone (DESYREL) 50 MG tablet Take 50 mg by mouth at bedtime.     No current facility-administered medications for this encounter.   BP 97/61   Pulse 66   Wt 63.9 kg (140 lb 13.9 oz)   SpO2 99%   BMI 28.45 kg/m   Wt Readings from Last 3 Encounters:  04/06/20 63.9 kg (140 lb 13.9 oz)  03/26/20 63.5 kg (140 lb)  03/15/20 63.9 kg (140 lb 12.8 oz)   PHYSICAL EXAM:  General:  No resp  difficulty HEENT: normal Neck: supple. no JVD. Carotids 2+ bilat; no bruits. No lymphadenopathy or thryomegaly appreciated. Cor: PMI nondisplaced. Regular rate & rhythm. No rubs, gallops or murmurs. Lungs: clear Abdomen: soft, nontender, nondistended. No hepatosplenomegaly. No bruits or masses. Good bowel sounds. Extremities: no cyanosis, clubbing, rash, edema Neuro: alert & orientedx3, cranial nerves grossly intact. moves all 4 extremities w/o difficulty. Affect pleasant  EKG: NSR A sensed V paced 64 bpm   Assessment/Plan: 1. Heart failure with mid-range EF: Patient has history of presumed nonischemic cardiomyopathy (Cardiolite with no ischemia, cath not done with CKD). However, echo in 4/19 showed EF improved to 60-65% with BiV pacing.  Echo in 10/19 showed stable EF 55-60% with mild-moderate MR, moderate AI.  PYP scan in 3/20 was equivocal for transthyretin amyloidosis. However, echo in 9/20 showed EF back down to 45-50%.  LHC/RHC in 8/21 showed nonobstructive CAD, normal filling pressures and cardiac output.  Echo 8/21 showed EF 50-55%, mildly decreased RV systolic function, moderate AI.  - NYHA III. Optivol- NO A fib over the last couple of weeks. Fluid index well below threshold. Activity ~1 hour per day.  - Volume status stable. Continue current dose of torsemide.  - Continue Bidil 1 tab tid.  - Continue spironolactone 12.5 daily (CKD limits further titration) - Dr. [name] recommended repeating PYP scan as study 1.5 years ago was equivocal. This has been ordered.   2. Atrial flutter/fibrillation: History of atypical flutter. Failed recent DCCV 02/12/20. Back in Afib/Flutter, persistent since ~10/15 and rate controlled. Went back in NSR with higher dose of amio.  - No A fib on device interrogation.  - Continue apixaban 5 mg bid. Check CBC. Having black  stool + fatigue.  3. CKD: Stage 3, followed by nephrology.   - check BMP today  4. Anemia : H/O GI bleeding: 02/2020 has had gastric ulcer  and gastric AVMs as well as small bowel AVMs.   She is now on Eliquis + iron. .  03/09/2020 Hgb 9.9. Check CBC today with fatigue.  Add protonix 40 mg daily.  She has follow up with GI next week.  5. Hypertension: Stable.     Follow up in 2 months with Dr [name].  Darrick Grinder, NP-C  04/06/2020

## 2020-04-06 NOTE — Patient Instructions (Signed)
START Protonix 40 mg, one tab daily  Labs today We will only contact you if something comes back abnormal or we need to make some changes. Otherwise no news is good news!  Your physician recommends that you schedule a follow-up appointment in: 2-3 months with Dr [name]  If you have any questions or concerns before your next appointment please send [name] a message through [facility] or call our office at [phone].    TO LEAVE A MESSAGE FOR THE NURSE SELECT OPTION 2, PLEASE LEAVE A MESSAGE INCLUDING: . YOUR NAME . DATE OF BIRTH . CALL BACK NUMBER . REASON FOR CALL**this is important as we prioritize the call backs  YOU WILL RECEIVE A CALL BACK THE SAME DAY AS LONG AS YOU CALL BEFORE 4:00 PM

## 2020-04-06 NOTE — Telephone Encounter (Signed)
Pt has a f/u appointment with Dr Volanda Napoleon on 04/07/2020, will follow up with pt regarding her request for personal care aid

## 2020-04-07 ENCOUNTER — Ambulatory Visit (INDEPENDENT_AMBULATORY_CARE_PROVIDER_SITE_OTHER): Payer: Medicare Other

## 2020-04-07 ENCOUNTER — Ambulatory Visit (INDEPENDENT_AMBULATORY_CARE_PROVIDER_SITE_OTHER): Payer: Medicare Other | Admitting: Family Medicine

## 2020-04-07 ENCOUNTER — Telehealth (HOSPITAL_COMMUNITY): Payer: Self-pay | Admitting: Cardiology

## 2020-04-07 ENCOUNTER — Encounter: Payer: Self-pay | Admitting: Family Medicine

## 2020-04-07 ENCOUNTER — Other Ambulatory Visit: Payer: Self-pay

## 2020-04-07 VITALS — BP 120/58 | HR 66 | Temp 97.5°F | Wt 142.8 lb

## 2020-04-07 DIAGNOSIS — M62838 Other muscle spasm: Secondary | ICD-10-CM

## 2020-04-07 DIAGNOSIS — G8929 Other chronic pain: Secondary | ICD-10-CM

## 2020-04-07 DIAGNOSIS — M5442 Lumbago with sciatica, left side: Secondary | ICD-10-CM

## 2020-04-07 DIAGNOSIS — I428 Other cardiomyopathies: Secondary | ICD-10-CM

## 2020-04-07 MED ORDER — TORSEMIDE 20 MG PO TABS: 20.0000 mg | ORAL_TABLET | Freq: Every day | ORAL | 3 refills | Status: DC

## 2020-04-07 MED ORDER — TIZANIDINE HCL 2 MG PO TABS: 2.0000 mg | ORAL_TABLET | Freq: Every evening | ORAL | 0 refills | Status: DC | PRN

## 2020-04-07 NOTE — Telephone Encounter (Signed)
Pt aware.

## 2020-04-07 NOTE — Telephone Encounter (Signed)
-----   Message from Conrad Muscotah, NP sent at 04/06/2020  4:27 PM EST ----- Please call and ask her to hold torsemide x 2 days then start torsemide 20 mg daily. Recheck BMEt in 7 days.   Hgb drifting down. Has GI follow up in 7 days.

## 2020-04-07 NOTE — Progress Notes (Signed)
Subjective:    Patient ID: Morgan Clay, female    DOB: 13-Dec-1945, 74 y.o.   MRN: [id number]  No chief complaint on file.   HPI Patient is a 74 year old female with past medical history significant for chronic diastolic CHF, nonischemic cardiomyopathy, atypical a flutter, HTN, DM 2, aortic atherosclerosis, A. fib on anticoagulation, pulmonary hypertension on echo, AVM of small bowel, OSA, asthma, COPD, diverticulitis, hep C, cirrhosis, hypothyroidism, osteopenia, CKD stage IV, fibroids, obesity, MDD, anemia, gout who was seen for ongoing concern.  Pt endorses worsening back pain x months.  Patient states back feels "real heavy" and not so much of pain.  Patient notes weakness in legs requiring her to sit if stands for more than 10 minutes.  Nothing makes sensation feel better.  Patient notes with laying on her right side will have intermittent cold and left lower back.  Patient denies injury.  Past Medical History:  Diagnosis Date  . A-fib ([place])    on Eliquis  . AICD (automatic cardioverter/defibrillator) present 10/06/2015  . Anemia   . ARF (acute renal failure) ([place])   . Arthritis    "qwhere" (01/03/2018)  . Asthma    reports mild asthma since childhood - had COPD on dx list from prior PCP  . Chronic bronchitis ([place])    "get it most years; not this past year though" (01/03/2018)  . Chronic kidney disease    "? stage;  followed by [facility]; [name]" (01/03/2018)  . Chronic systolic congestive heart failure ([place])   . DEPRESSION 12/17/2009   Annotation: PHQ-9 score = 14 done on 12/17/2009 Qualifier: Diagnosis of  By: Hassell Done FNP, Tori Milks    . Diabetes mellitus without complication (HCC)    DIET CONTROLLED   . Diverticulosis   . Enteritis   . FIBROIDS, UTERUS 03/05/2008  . GI bleeding 01/03/2018  . Glaucoma   . Gout   . Hepatitis C    HEPATITIS C - s/p treatment with Harvoni, saw hepatology, Dawn Drazek  . History of blood transfusion ~ 11/2017  . Hyperlipemia 12/06/2012  .  HYPERTENSION, BENIGN 04/24/2007  . Hypothyroidism   . LBBB (left bundle branch block)   . Lower GI bleeding    "been dealing w/it since 07/2017" (01/03/2018)  . Nonischemic cardiomyopathy ([place])   . OBESITY 05/27/2009  . OBSTRUCTIVE SLEEP APNEA 11/14/2007   no CPAP  . OSTEOPENIA 09/30/2008  . Pain and swelling of left upper extremity 08/08/2017  . Personal history of other infectious and parasitic disease    Hepatitis B  . Pneumonia    "several times" (01/03/2018)  . Pulmonary edema, acute ([place]) 08/09/2017    No Known Allergies  ROS General: Denies fever, chills, night sweats, changes in weight, changes in appetite HEENT: Denies headaches, ear pain, changes in vision, rhinorrhea, sore throat CV: Denies CP, palpitations, SOB, orthopnea Pulm: Denies SOB, cough, wheezing GI: Denies abdominal pain, nausea, vomiting, diarrhea, constipation GU: Denies dysuria, hematuria, frequency, vaginal discharge Msk: Denies muscle cramps, joint pains  + left-sided low back pain, weakness in LLE Neuro: Denies weakness, numbness, tingling Skin: Denies rashes, bruising Psych: Denies depression, anxiety, hallucinations      Objective:    Blood pressure (!) 120/58, pulse 66, temperature (!) 97.5 F (36.4 C), temperature source Oral, weight 142 lb 12.8 oz (64.8 kg), SpO2 99 %.   Gen. Pleasant, well-nourished, in no distress, normal affect HEENT: Halfway/AT, face symmetric, conjunctiva clear, no scleral icterus, PERRLA, EOMI, nares patent without drainage Lungs: no accessory muscle  use, CTAB, no wheezes or rales Cardiovascular: RRR, no m/r/g, no peripheral edema Musculoskeletal: TTP of midline mid thoracic and lumbosacral spine also with TTP of left lumbar paraspinal muscles.  Negative logroll.  Positive b/l hip pain with abduction, FADIR, FABER.  Tight L IT band.  No deformities, no cyanosis or clubbing, normal tone Neuro:  A&Ox3, CN II-XII intact, normal gait Skin:  Warm, no lesions/ rash   Wt Readings  from Last 3 Encounters:  04/06/20 140 lb 13.9 oz (63.9 kg)  03/26/20 140 lb (63.5 kg)  03/15/20 140 lb 12.8 oz (63.9 kg)    Lab Results  Component Value Date   WBC 6.4 04/06/2020   HGB 8.6 (L) 04/06/2020   HCT 27.0 (L) 04/06/2020   PLT 215 04/06/2020   GLUCOSE 112 (H) 04/06/2020   CHOL 89 07/23/2019   TRIG 97.0 07/23/2019   HDL 36.40 (L) 07/23/2019   LDLCALC 33 07/23/2019   ALT 13 03/01/2020   AST 23 03/01/2020   NA 132 (L) 04/06/2020   K 4.3 04/06/2020   CL 102 04/06/2020   CREATININE 3.54 (H) 04/06/2020   BUN 85 (H) 04/06/2020   CO2 18 (L) 04/06/2020   TSH 3.11 02/04/2020   INR 1.3 (H) 12/05/2019   HGBA1C 6.1 (H) 02/04/2020   MICROALBUR 7.7 (H) 11/24/2013    Assessment/Plan:  Chronic midline low back pain with left-sided sciatica - Discussed possible causes including arthritis, lumbosacral radiculopathy -We will obtain imaging. -Further recommendations based on imaging.  Also to consider PT - Plan: DG Lumbar Spine Complete  Muscle spasm -Discussed stretching exercises -Continue supportive care - Plan: tiZANidine (ZANAFLEX) 2 MG tablet   F/u as needed  Grier Mitts, MD

## 2020-04-07 NOTE — Patient Instructions (Signed)
Chronic Back Pain When back pain lasts longer than 3 months, it is called chronic back pain.The cause of your back pain may not be known. Some common causes include:  Wear and tear (degenerative disease) of the bones, ligaments, or disks in your back.  Inflammation and stiffness in your back (arthritis). People who have chronic back pain often go through certain periods in which the pain is more intense (flare-ups). Many people can learn to manage the pain with home care. Follow these instructions at home: Pay attention to any changes in your symptoms. Take these actions to help with your pain: Activity   Avoid bending and other activities that make the problem worse.  Maintain a proper position when standing or sitting: ? When standing, keep your upper back and neck straight, with your shoulders pulled back. Avoid slouching. ? When sitting, keep your back straight and relax your shoulders. Do not round your shoulders or pull them backward.  Do not sit or stand in one place for long periods of time.  Take brief periods of rest throughout the day. This will reduce your pain. Resting in a lying or standing position is usually better than sitting to rest.  When you are resting for longer periods, mix in some mild activity or stretching between periods of rest. This will help to prevent stiffness and pain.  Get regular exercise. Ask your health care provider what activities are safe for you.  Do not lift anything that is heavier than 10 lb (4.5 kg). Always use proper lifting technique, which includes: ? Bending your knees. ? Keeping the load close to your body. ? Avoiding twisting.  Sleep on a firm mattress in a comfortable position. Try lying on your side with your knees slightly bent. If you lie on your back, put a pillow under your knees. Managing pain  If directed, apply ice to the painful area. Your health care provider may recommend applying ice during the first 24-48 hours after  a flare-up begins. ? Put ice in a plastic bag. ? Place a towel between your skin and the bag. ? Leave the ice on for 20 minutes, 2-3 times per day.  If directed, apply heat to the affected area as often as told by your health care provider. Use the heat source that your health care provider recommends, such as a moist heat pack or a heating pad. ? Place a towel between your skin and the heat source. ? Leave the heat on for 20-30 minutes. ? Remove the heat if your skin turns bright red. This is especially important if you are unable to feel pain, heat, or cold. You may have a greater risk of getting burned.  Try soaking in a warm tub.  Take over-the-counter and prescription medicines only as told by your health care provider.  Keep all follow-up visits as told by your health care provider. This is important. Contact a health care provider if:  You have pain that is not relieved with rest or medicine. Get help right away if:  You have weakness or numbness in one or both of your legs or feet.  You have trouble controlling your bladder or your bowels.  You have nausea or vomiting.  You have pain in your abdomen.  You have shortness of breath or you faint. This information is not intended to replace advice given to you by your health care provider. Make sure you discuss any questions you have with your health care provider. Document Revised: 08/15/2018   Document Reviewed: 11/01/2016 Elsevier Patient Education  [place] or Strain Rehab Ask your health care provider which exercises are safe for you. Do exercises exactly as told by your health care provider and adjust them as directed. It is normal to feel mild stretching, pulling, tightness, or discomfort as you do these exercises. Stop right away if you feel sudden pain or your pain gets worse. Do not begin these exercises until told by your health care provider. Stretching and range-of-motion exercises These  exercises warm up your muscles and joints and improve the movement and flexibility of your back. These exercises also help to relieve pain, numbness, and tingling. Lumbar rotation  1. Lie on your back on a firm surface and bend your knees. 2. Straighten your arms out to your sides so each arm forms a 90-degree angle (right angle) with a side of your body. 3. Slowly move (rotate) both of your knees to one side of your body until you feel a stretch in your lower back (lumbar). Try not to let your shoulders lift off the floor. 4. Hold this position for __________ seconds. 5. Tense your abdominal muscles and slowly move your knees back to the starting position. 6. Repeat this exercise on the other side of your body. Repeat __________ times. Complete this exercise __________ times a day. Single knee to chest  1. Lie on your back on a firm surface with both legs straight. 2. Bend one of your knees. Use your hands to move your knee up toward your chest until you feel a gentle stretch in your lower back and buttock. ? Hold your leg in this position by holding on to the front of your knee. ? Keep your other leg as straight as possible. 3. Hold this position for __________ seconds. 4. Slowly return to the starting position. 5. Repeat with your other leg. Repeat __________ times. Complete this exercise __________ times a day. Prone extension on elbows  1. Lie on your abdomen on a firm surface (prone position). 2. Prop yourself up on your elbows. 3. Use your arms to help lift your chest up until you feel a gentle stretch in your abdomen and your lower back. ? This will place some of your body weight on your elbows. If this is uncomfortable, try stacking pillows under your chest. ? Your hips should stay down, against the surface that you are lying on. Keep your hip and back muscles relaxed. 4. Hold this position for __________ seconds. 5. Slowly relax your upper body and return to the starting  position. Repeat __________ times. Complete this exercise __________ times a day. Strengthening exercises These exercises build strength and endurance in your back. Endurance is the ability to use your muscles for a long time, even after they get tired. Pelvic tilt This exercise strengthens the muscles that lie deep in the abdomen. 1. Lie on your back on a firm surface. Bend your knees and keep your feet flat on the floor. 2. Tense your abdominal muscles. Tip your pelvis up toward the ceiling and flatten your lower back into the floor. ? To help with this exercise, you may place a small towel under your lower back and try to push your back into the towel. 3. Hold this position for __________ seconds. 4. Let your muscles relax completely before you repeat this exercise. Repeat __________ times. Complete this exercise __________ times a day. Alternating arm and leg raises  1. Get on your hands and knees  on a firm surface. If you are on a hard floor, you may want to use padding, such as an exercise mat, to cushion your knees. 2. Line up your arms and legs. Your hands should be directly below your shoulders, and your knees should be directly below your hips. 3. Lift your left leg behind you. At the same time, raise your right arm and straighten it in front of you. ? Do not lift your leg higher than your hip. ? Do not lift your arm higher than your shoulder. ? Keep your abdominal and back muscles tight. ? Keep your hips facing the ground. ? Do not arch your back. ? Keep your balance carefully, and do not hold your breath. 4. Hold this position for __________ seconds. 5. Slowly return to the starting position. 6. Repeat with your right leg and your left arm. Repeat __________ times. Complete this exercise __________ times a day. Abdominal set with straight leg raise  1. Lie on your back on a firm surface. 2. Bend one of your knees and keep your other leg straight. 3. Tense your abdominal  muscles and lift your straight leg up, 4-6 inches (10-15 cm) off the ground. 4. Keep your abdominal muscles tight and hold this position for __________ seconds. ? Do not hold your breath. ? Do not arch your back. Keep it flat against the ground. 5. Keep your abdominal muscles tense as you slowly lower your leg back to the starting position. 6. Repeat with your other leg. Repeat __________ times. Complete this exercise __________ times a day. Single leg lower with bent knees 1. Lie on your back on a firm surface. 2. Tense your abdominal muscles and lift your feet off the floor, one foot at a time, so your knees and hips are bent in 90-degree angles (right angles). ? Your knees should be over your hips and your lower legs should be parallel to the floor. 3. Keeping your abdominal muscles tense and your knee bent, slowly lower one of your legs so your toe touches the ground. 4. Lift your leg back up to return to the starting position. ? Do not hold your breath. ? Do not let your back arch. Keep your back flat against the ground. 5. Repeat with your other leg. Repeat __________ times. Complete this exercise __________ times a day. Posture and body mechanics Good posture and healthy body mechanics can help to relieve stress in your body's tissues and joints. Body mechanics refers to the movements and positions of your body while you do your daily activities. Posture is part of body mechanics. Good posture means:  Your spine is in its natural S-curve position (neutral).  Your shoulders are pulled back slightly.  Your head is not tipped forward. Follow these guidelines to improve your posture and body mechanics in your everyday activities. Standing   When standing, keep your spine neutral and your feet about hip width apart. Keep a slight bend in your knees. Your ears, shoulders, and hips should line up.  When you do a task in which you stand in one place for a long time, place one foot up on  a stable object that is 2-4 inches (5-10 cm) high, such as a footstool. This helps keep your spine neutral. Sitting   When sitting, keep your spine neutral and keep your feet flat on the floor. Use a footrest, if necessary, and keep your thighs parallel to the floor. Avoid rounding your shoulders, and avoid tilting your head forward.  When working at a desk or a computer, keep your desk at a height where your hands are slightly lower than your elbows. Slide your chair under your desk so you are close enough to maintain good posture.  When working at a computer, place your monitor at a height where you are looking straight ahead and you do not have to tilt your head forward or downward to look at the screen. Resting  When lying down and resting, avoid positions that are most painful for you.  If you have pain with activities such as sitting, bending, stooping, or squatting, lie in a position in which your body does not bend very much. For example, avoid curling up on your side with your arms and knees near your chest (fetal position).  If you have pain with activities such as standing for a long time or reaching with your arms, lie with your spine in a neutral position and bend your knees slightly. Try the following positions: ? Lying on your side with a pillow between your knees. ? Lying on your back with a pillow under your knees. Lifting   When lifting objects, keep your feet at least shoulder width apart and tighten your abdominal muscles.  Bend your knees and hips and keep your spine neutral. It is important to lift using the strength of your legs, not your back. Do not lock your knees straight out.  Always ask for help to lift heavy or awkward objects. This information is not intended to replace advice given to you by your health care provider. Make sure you discuss any questions you have with your health care provider. Document Revised: 08/16/2018 Document Reviewed:  05/16/2018 Elsevier Patient Education  [place].

## 2020-04-07 NOTE — Addendum Note (Signed)
Addended by: Kerry Dory on: 04/07/2020 09:33 AM   Modules accepted: Orders

## 2020-04-13 ENCOUNTER — Ambulatory Visit (INDEPENDENT_AMBULATORY_CARE_PROVIDER_SITE_OTHER): Payer: Medicare Other

## 2020-04-13 DIAGNOSIS — Z95810 Presence of automatic (implantable) cardiac defibrillator: Secondary | ICD-10-CM | POA: Diagnosis not present

## 2020-04-13 DIAGNOSIS — I5042 Chronic combined systolic (congestive) and diastolic (congestive) heart failure: Secondary | ICD-10-CM | POA: Diagnosis not present

## 2020-04-13 DIAGNOSIS — I428 Other cardiomyopathies: Secondary | ICD-10-CM

## 2020-04-13 NOTE — Progress Notes (Signed)
EPIC Encounter for ICM Monitoring  Patient Name: Morgan Clay is a 74 y.o. female Date: 04/13/2020 Primary Care Physican: Billie Ruddy, MD Primary Cardiologist:[facility] Electrophysiologist: Caryl Comes Nephrologist: [name] Bi-V Pacing:100% 12/7/2021Weight:140lbs  Clinical Status (06-Apr-2020 to 13-Apr-2020) Time in AT/AF  0.0 hr/day (0.0%)     Spoke with patient and reports breathing is at baseline and weight is stable.    Optivol thoracic impedancesuggestingpossible fluid accumulation since 04/06/2020 but is trending back to baseline normal. Decreased impedance correlates with decreased Torsemide dosage started on 12/1.  Prescribed:  Torsemide20 mgTake1tablets (20 mg total)daily (dosage decreased on 04/07/2020).  Spironolactone 25 mg take 0.5 tablet (12.5 mg total) daily  Eliquis 5 mg take 1 tablet twice a day  Labs: 04/06/2020 Creatinine 3.54, BUN 85, Potassium 4.3, Sodium 132, GFR 13 03/17/2020 Creatinine 3.05, BUN 71, Potassium 4.2, Sodium 133, GFR 16  03/04/2020 Creatinine 2.23, BUN 43, Potassium 3.9, Sodium 135, GFR 23  03/03/2020 Creatinine2.18, BUN46, Potassium3.8, Sodium136, GFR23  03/02/2020 Creatinine2.17, BUN52, Potassium3.7, Sodium138, GFR24  03/01/2020 Creatinine2.23, BUN53, Potassium4.3, Sodium137, GFR23  02/12/2020 Creatinine2.80, BUN75, Potassium4.6, [id number]  02/04/2020 Creatinine2.24, BUN60, Potassium4.5, [id number] 01/06/2020 Creatinine 2.57, BUN 69, Potassium 4.9, Sodium 130, GFR 18-21 12/29/2019 Creatinine 2.88, BUN 80, Potassium 3.9, Sodium 132, GFR 16-18 A complete set of results can be found in Results Review.  Recommendations:Advised to limit salt and fluid intake.  Copy sent to Dr [name] for review and recommendations.   Follow-up plan: ICM clinic phone appointment on12/13/2021 to recheck fluid levels.91 day device clinic remote transmission12/28/2021.  EP/Cardiology Office  Visits:06/18/2020 with Dr [name].Last EP telehealth visit was 07/24/2019 (no recall)  Copy of ICM check sent to [facility].    3 month ICM trend: 04/13/2020    1 Year ICM trend:       [name], RN 04/13/2020 9:43 AM

## 2020-04-14 MED ORDER — TORSEMIDE 20 MG PO TABS: ORAL_TABLET | ORAL | 3 refills | Status: DC

## 2020-04-14 NOTE — Progress Notes (Signed)
Alternate torsemide 20 mg daily with torsemide 40 mg daily.  BMET in 1 week.

## 2020-04-14 NOTE — Progress Notes (Signed)
Spoke with patient.  Advised Dr [name] recommended to alternate torsemide 20 mg daily with torsemide 40 mg daily.  BMET in 1 week. Scheduled BMET on 04/21/2020.  She verbalized understanding of Torsemide dosage change and has supply on hand. Advised to call if she has any questions or changes in condition.

## 2020-04-15 ENCOUNTER — Other Ambulatory Visit (INDEPENDENT_AMBULATORY_CARE_PROVIDER_SITE_OTHER): Payer: Medicare Other

## 2020-04-15 ENCOUNTER — Ambulatory Visit (INDEPENDENT_AMBULATORY_CARE_PROVIDER_SITE_OTHER): Payer: Medicare Other | Admitting: Internal Medicine

## 2020-04-15 ENCOUNTER — Encounter: Payer: Self-pay | Admitting: Internal Medicine

## 2020-04-15 VITALS — BP 118/48 | HR 68 | Ht <= 58 in | Wt 142.2 lb

## 2020-04-15 DIAGNOSIS — N189 Chronic kidney disease, unspecified: Secondary | ICD-10-CM

## 2020-04-15 DIAGNOSIS — D500 Iron deficiency anemia secondary to blood loss (chronic): Secondary | ICD-10-CM

## 2020-04-15 DIAGNOSIS — Z7901 Long term (current) use of anticoagulants: Secondary | ICD-10-CM

## 2020-04-15 DIAGNOSIS — D631 Anemia in chronic kidney disease: Secondary | ICD-10-CM

## 2020-04-15 DIAGNOSIS — K5520 Angiodysplasia of colon without hemorrhage: Secondary | ICD-10-CM | POA: Diagnosis not present

## 2020-04-15 DIAGNOSIS — K7469 Other cirrhosis of liver: Secondary | ICD-10-CM

## 2020-04-15 DIAGNOSIS — R197 Diarrhea, unspecified: Secondary | ICD-10-CM

## 2020-04-15 DIAGNOSIS — B1920 Unspecified viral hepatitis C without hepatic coma: Secondary | ICD-10-CM

## 2020-04-15 HISTORY — DX: Iron deficiency anemia secondary to blood loss (chronic): D50.0

## 2020-04-15 LAB — CBC WITH DIFFERENTIAL/PLATELET
Basophils Absolute: 0.1 10*3/uL (ref 0.0–0.1)
Basophils Relative: 1.1 % (ref 0.0–3.0)
Eosinophils Absolute: 0.1 10*3/uL (ref 0.0–0.7)
Eosinophils Relative: 1.5 % (ref 0.0–5.0)
HCT: 23.6 % — CL (ref 36.0–46.0)
Hemoglobin: 7.7 g/dL — CL (ref 12.0–15.0)
Lymphocytes Relative: 9.9 % — ABNORMAL LOW (ref 12.0–46.0)
Lymphs Abs: 0.8 10*3/uL (ref 0.7–4.0)
MCHC: 32.6 g/dL (ref 30.0–36.0)
MCV: 91 fl (ref 78.0–100.0)
Monocytes Absolute: 1 10*3/uL (ref 0.1–1.0)
Monocytes Relative: 12.9 % — ABNORMAL HIGH (ref 3.0–12.0)
Neutro Abs: 5.9 10*3/uL (ref 1.4–7.7)
Neutrophils Relative %: 74.6 % (ref 43.0–77.0)
Platelets: 208 10*3/uL (ref 150.0–400.0)
RBC: 2.6 Mil/uL — ABNORMAL LOW (ref 3.87–5.11)
RDW: 25.3 % — ABNORMAL HIGH (ref 11.5–15.5)
WBC: 7.9 10*3/uL (ref 4.0–10.5)

## 2020-04-15 LAB — FERRITIN: Ferritin: 71.3 ng/mL (ref 10.0–291.0)

## 2020-04-15 NOTE — Assessment & Plan Note (Signed)
As per chronic blood loss anemia.

## 2020-04-15 NOTE — Assessment & Plan Note (Addendum)
This is playing a role as well undoubtedly though she certainly does have history of GI bleeding and sounds like she had some more recently.  However, her creatinine has increased significantly lately and we know hemoglobin can decrease when that happens as well.  Needless to say anemia itself is multifactorial.

## 2020-04-15 NOTE — Patient Instructions (Signed)
Your provider has requested that you go to the basement level for lab work before leaving today. Press "B" on the elevator. The lab is located at the first door on the left as you exit the elevator.  Due to recent changes in healthcare laws, you may see the results of your imaging and laboratory studies on MyChart before your provider has had a chance to review them.  We understand that in some cases there may be results that are confusing or concerning to you. Not all laboratory results come back in the same time frame and the provider may be waiting for multiple results in order to interpret others.  Please give us 48 hours in order for your provider to thoroughly review all the results before contacting the office for clarification of your results.   I appreciate the opportunity to care for you. Carl Gessner, MD, FACG 

## 2020-04-15 NOTE — Assessment & Plan Note (Addendum)
Perhaps technically not compensated but the only thing is a trace esophageal varix seen on most recent endoscopic exam.  This does not seem to be part of her problems as far as gastrointestinal bleeding.  She is not in need of regular screening for esophageal varices as she is on carvedilol for her cardiac issues.  She does not have low platelets anyway.  She had ultrasound without mass lesion in October.  She should have a repeat ultrasound of the liver in 6 months from that time.  We will place a reminder.  09 August 2020.

## 2020-04-15 NOTE — Assessment & Plan Note (Addendum)
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  Recheck CBC and ferritin today.  She did receive 1 dose of Feraheme when hospitalized.  She may need a capsule endoscopy again.  We will wait to see what the hemoglobin is before making that decision.  Question if she would be a candidate for a single balloon enteroscopy depending upon what we see.  The diarrhea is new, it was transient perhaps she had some sort of infectious enteritis and bled related to that given the description that sounds like melena.  I am not surprised she is heme positive today but she does not have melena now.

## 2020-04-15 NOTE — Progress Notes (Signed)
Morgan Clay 74 y.o. [birth year] [id number]  Assessment & Plan:   Encounter Diagnoses  Name Primary?  [name] Anemia due to chronic blood loss Yes  . AVM (arteriovenous malformation) of small bowel, acquired   . 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 (HCV) ([place])   . Long term current use of anticoagulant   . Diarrhea, unspecified type   . Anemia in chronic kidney disease, unspecified CKD stage    Anemia due to chronic blood loss Current working diagnosis has been related to AVMs aggravated by anticoagulation.  Recheck CBC and ferritin today.  She did receive 1 dose of Feraheme when hospitalized.  She may need a capsule endoscopy again.  We will wait to see what the hemoglobin is before making that decision.  Question if she would be a candidate for a single balloon enteroscopy depending upon what we see.  The diarrhea is new, it was transient perhaps she had some sort of infectious enteritis and bled related to that given the description that sounds like melena.  I am not surprised she is heme positive today but she does not have melena now.   AVM (arteriovenous malformation) of small bowel, acquired As per chronic blood loss anemia.  Anemia in chronic kidney disease This is playing a role as well undoubtedly though she certainly does have history of GI bleeding and sounds like she had some more recently.  However, her creatinine has increased significantly lately and we know hemoglobin can decrease when that happens as well.  Needless to say anemia itself is multifactorial.  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 (HCV) ([place]) Perhaps technically not compensated but the only thing is a trace esophageal varix seen on most recent endoscopic exam.  This does not seem to be part of her problems as far as gastrointestinal bleeding.  She is not in need of regular screening for esophageal varices as she is on carvedilol for her cardiac issues.  She does not have low  platelets anyway.  She had ultrasound without mass lesion in October.  She should have a repeat ultrasound of the liver in 6 months from that time.  We will place a reminder.  09 August 2020.    I appreciate the opportunity to care for this patient. CC: Billie Ruddy, MD Loralie Champagne, MD  Subjective:   Chief Complaint: Follow-up of GI bleeding  HPI The patient is a 74 year old woman with CHF status post AICD, on Eliquis, history of GI bleeding thought due to AVMs, compensated cirrhosis from hepatitis C (treated) here for follow-up of chronic blood loss anemia and GI bleeding after hospitalization in October.  She was admitted with decline in hemoglobin into the 6 range, she underwent small bowel enteroscopy by Dr. Havery Moros.  March 02, 2020  Findings as below  Esophagogastric landmarks identified. - Trace column of esophageal varix. - Normal esophagus otherwise. - Small superficial AVMs in the pre-pyloric stomach with other punctate vascular lesions. Treated with argon plasma coagulation (APC). - Normal stomach otherwise. There was some scant superficial endoscopic trauma to the gastric body upon withdrawing the enteroscope, as well as superifical small mucosal disruption of the proximal esophagus from the enteroscope. - Benign appearing small duodenal polyp, not removed given anticoagulated status and recent bleeding - Prominent ampulla incompletely evaluated without side viewing endoscope - Normal examined duodenum otherwise - The examined portion of the jejunum was normal. Tattooed distal most extent - AVM noted on prior capsule study.  She had received blood transfusions  and her hemoglobin improved.  Eliquis was held for a little while restarted.  Hemoglobin on discharge 9.2 November 2 9.9  November 30 8.6.  In the interim last week she developed crampy abdominal pain and diarrhea that was very black and dark.  She took Pepto-Bismol but says her stools were black before  that.  This is different than when her stools are iron colored.  She has recovered from that.  She has not noted any bright red blood per rectum.  Nobody else was sick.  No other risk factors for diarrhea that I can ascertain.  She has not been on NSAIDs or aspirin.  She had evaluation in 2019 with a colonoscopy and a small polyp and diverticulosis and a gastric AVM on EGD cauterized with APC then.  Other problems are that her creatinine is significantly worse and her diuretic regimen is being reduced.  She reports that she can walk without significant dyspnea on exertion though she does get some with climbing steps she can sleep flat.  She feels her breathing is stable to improved.  Last BUN 85 creatinine 3.54 on April 06, 2020.  It was 2.23 on discharge.  There are plans to recheck her be met next week after reducing her diuretic regimen. No Known Allergies Current Meds  Medication Sig  . allopurinol (ZYLOPRIM) 300 MG tablet Take 300 mg by mouth daily.   [name] amiodarone (PACERONE) 200 MG tablet Take 1 tablet (200 mg total) by mouth daily.  [name] apixaban (ELIQUIS) 5 MG TABS tablet Take 1 tablet (5 mg total) by mouth 2 (two) times daily.  . calcitRIOL (ROCALTROL) 0.25 MCG capsule Take 0.25 mcg by mouth every Monday, Wednesday, and Friday.   . carvedilol (COREG) 25 MG tablet Take 25 mg by mouth 2 (two) times daily with a meal.  . colchicine 0.6 MG tablet Take 0.6 mg by mouth daily.  . ferrous sulfate 325 (65 FE) MG tablet Take 1 tablet (325 mg total) by mouth daily.  [name] gabapentin (NEURONTIN) 300 MG capsule Take 1 capsule (300 mg total) by mouth 2 (two) times daily.  . isosorbide-hydrALAZINE (BIDIL) 20-37.5 MG tablet Take 1 tablet by mouth 3 (three) times daily.  [name] levothyroxine (SYNTHROID) 137 MCG tablet Take 1 tablet (137 mcg total) by mouth daily before breakfast.  . nitroGLYCERIN (NITROSTAT) 0.4 MG SL tablet Place 0.4 mg under the tongue every 5 (five) minutes x 3 doses as needed for chest pain.    . pantoprazole (PROTONIX) 40 MG tablet Take 1 tablet (40 mg total) by mouth daily.  . sertraline (ZOLOFT) 50 MG tablet Take 50 mg by mouth daily.  [name] spironolactone (ALDACTONE) 25 MG tablet Take 0.5 tablets (12.5 mg total) by mouth daily.  [name] tiZANidine (ZANAFLEX) 2 MG tablet Take 1 tablet (2 mg total) by mouth at bedtime as needed for muscle spasms.  [name] torsemide (DEMADEX) 20 MG tablet Take 1 tablet (20 mg total) every other day alternating with 2 tablets (40 mg total) every other day.  . traZODone (DESYREL) 50 MG tablet Take 50 mg by mouth at bedtime.   Past Medical History:  Diagnosis Date  . A-fib ([place])    on Eliquis  . AICD (automatic cardioverter/defibrillator) present 10/06/2015  . Anemia   . ARF (acute renal failure) ([place])   . Arthritis    "qwhere" (01/03/2018)  . Asthma    reports mild asthma since childhood - had COPD on dx list from prior PCP  . Chronic bronchitis ([place])    "  get it most years; not this past year though" (01/03/2018)  . Chronic kidney disease    "? stage;  followed by [facility]; [name]" (01/03/2018)  . Chronic systolic congestive heart failure ([place])   . DEPRESSION 12/17/2009   Annotation: PHQ-9 score = 14 done on 12/17/2009 Qualifier: Diagnosis of  By: Hassell Done FNP, Tori Milks    . Diabetes mellitus without complication (HCC)    DIET CONTROLLED   . Diverticulosis   . Enteritis   . FIBROIDS, UTERUS 03/05/2008  . GI bleeding 01/03/2018  . Glaucoma   . Gout   . Hepatitis C    HEPATITIS C - s/p treatment with Harvoni, saw hepatology, Dawn Drazek  . History of blood transfusion ~ 11/2017  . Hyperlipemia 12/06/2012  . HYPERTENSION, BENIGN 04/24/2007  . Hypothyroidism   . LBBB (left bundle branch block)   . Lower GI bleeding    "been dealing w/it since 07/2017" (01/03/2018)  . Nonischemic cardiomyopathy ([place])   . OBESITY 05/27/2009  . OBSTRUCTIVE SLEEP APNEA 11/14/2007   no CPAP  . OSTEOPENIA 09/30/2008  . Pain and swelling of left upper extremity 08/08/2017   . Personal history of other infectious and parasitic disease    Hepatitis B  . Pneumonia    "several times" (01/03/2018)  . Pulmonary edema, acute ([place]) 08/09/2017   Past Surgical History:  Procedure Laterality Date  . APPENDECTOMY    . CARDIAC CATHETERIZATION    . CARDIOVERSION N/A 12/14/2014   Procedure: CARDIOVERSION;  Surgeon: Dorothy Spark, MD;  Location[facility] ENDOSCOPY;  Service: Cardiovascular;  Laterality: N/A;  . CARDIOVERSION N/A 02/26/2017   Procedure: CARDIOVERSION;  Surgeon: Evans Lance, MD;  Location: [place] CV LAB;  Service: Cardiovascular;  Laterality: N/A;  . CARDIOVERSION N/A 07/24/2017   Procedure: CARDIOVERSION;  Surgeon: Thayer Headings, MD;  Location: [facility] ENDOSCOPY;  Service: Cardiovascular;  Laterality: N/A;  . CARDIOVERSION N/A 02/12/2020   Procedure: CARDIOVERSION;  Surgeon: [name], MD;  Location: [facility] ENDOSCOPY;  Service: Cardiovascular;  Laterality: N/A;  . COLONOSCOPY N/A 11/08/2017   Procedure: COLONOSCOPY;  Surgeon: Milus Banister, MD;  Location: [facility] ENDOSCOPY;  Service: Endoscopy;  Laterality: N/A;  . DILATION AND CURETTAGE OF UTERUS    . ENTEROSCOPY N/A 03/02/2020   Procedure: ENTEROSCOPY;  Surgeon: Yetta Flock, MD;  Location: [facility] ENDOSCOPY;  Service: Gastroenterology;  Laterality: N/A;  . EP IMPLANTABLE DEVICE N/A 10/06/2015   Procedure: BIV ICD Generator Changeout;  Surgeon: [name], MD;  Location: [place] CV LAB;  Service: Cardiovascular;  Laterality: N/A;  . ESOPHAGOGASTRODUODENOSCOPY N/A 10/26/2017   Procedure: ESOPHAGOGASTRODUODENOSCOPY (EGD);  Surgeon: Ladene Artist, MD;  Location: [facility] ENDOSCOPY;  Service: Endoscopy;  Laterality: N/A;  . ESOPHAGOGASTRODUODENOSCOPY (EGD) WITH PROPOFOL N/A 11/07/2017   Procedure: ESOPHAGOGASTRODUODENOSCOPY (EGD) WITH PROPOFOL;  Surgeon: Milus Banister, MD;  Location: [facility] ENDOSCOPY;  Service: Endoscopy;  Laterality: N/A;  . HOT HEMOSTASIS N/A 10/26/2017   Procedure: HOT HEMOSTASIS (ARGON  PLASMA COAGULATION/BICAP);  Surgeon: Ladene Artist, MD;  Location: [facility] ENDOSCOPY;  Service: Endoscopy;  Laterality: N/A;  . HOT HEMOSTASIS N/A 03/02/2020   Procedure: HOT HEMOSTASIS (ARGON PLASMA COAGULATION/BICAP);  Surgeon: Yetta Flock, MD;  Location: [facility] ENDOSCOPY;  Service: Gastroenterology;  Laterality: N/A;  . INSERT / REPLACE / REMOVE PACEMAKER  ?2008  . POLYPECTOMY  11/08/2017   Procedure: POLYPECTOMY;  Surgeon: Milus Banister, MD;  Location: [facility] ENDOSCOPY;  Service: Endoscopy;;  . PROCTOSCOPY N/A 05/22/2018   Procedure: RIGID PROCTOSCOPY;  Surgeon: Michael Boston, MD;  Location: [facility] ORS;  Service: General;  Laterality: N/A;  . RIGHT HEART CATH N/A 02/13/2018   Procedure: RIGHT HEART CATH;  Surgeon: [name], MD;  Location: [place] CV LAB;  Service: Cardiovascular;  Laterality: N/A;  . RIGHT/LEFT HEART CATH AND CORONARY ANGIOGRAPHY N/A 12/11/2019   Procedure: RIGHT/LEFT HEART CATH AND CORONARY ANGIOGRAPHY;  Surgeon: [name], MD;  Location: [place] CV LAB;  Service: Cardiovascular;  Laterality: N/A;  . SUBMUCOSAL TATTOO INJECTION  03/02/2020   Procedure: SUBMUCOSAL TATTOO INJECTION;  Surgeon: Yetta Flock, MD;  Location: [facility] ENDOSCOPY;  Service: Gastroenterology;;  . TUBAL LIGATION    . XI ROBOTIC ASSISTED LOWER ANTERIOR RESECTION N/A 05/22/2018   Procedure: XI ROBOTIC ASSISTED SIGMOID COLOECTOMY MOBILIZATION OF SPLENIC FLEXURE, FIREFLY ASSESSMENT OF PERFUSION;  Surgeon: Michael Boston, MD;  Location: [facility] ORS;  Service: General;  Laterality: N/A;  ERAS PATHWAY   Social History   Social History Narrative   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      Home Situation: lives alone      Spiritual Beliefs: Christian - [employer]      Lifestyle: no regular exercise; poor diet      She is divorced at least one adult child   Never smoker    rare alcohol to occasional at best    no substance abuse         family history includes Asthma in her father and  sister; Heart attack in her father and mother; Lung cancer in her sister; Stroke in her brother.   Review of Systems See HPI  Objective:   Physical Exam BP (!) 118/48 (BP Location: Left Arm, Patient Position: Sitting, Cuff Size: Normal)   Pulse 68   Ht _0  (1.473 m) Comment: height measured without shoes  Wt 142 lb 4 oz (64.5 kg)   BMI 29.73 kg/m  Well-developed well-nourished black woman in no acute distress she is able to lie flat without breathing difficulty She is alert and oriented x3 The lungs are clear to auscultation Heart sounds normal S1-S2 I do not hear any rubs murmurs or gallops there is no peripheral edema detected The abdomen is soft nontender without organomegaly or mass  Data reviewed include hospitalization notes CHF clinic notes since hospitalization, labs in the EMR photos and images of small bowel endoscopy

## 2020-04-16 ENCOUNTER — Other Ambulatory Visit: Payer: Self-pay

## 2020-04-16 DIAGNOSIS — D500 Iron deficiency anemia secondary to blood loss (chronic): Secondary | ICD-10-CM

## 2020-04-16 NOTE — Addendum Note (Signed)
Addended by: Marlon Pel on: 04/16/2020 03:26 PM   Modules accepted: Orders

## 2020-04-19 ENCOUNTER — Telehealth: Payer: Self-pay

## 2020-04-19 ENCOUNTER — Other Ambulatory Visit (INDEPENDENT_AMBULATORY_CARE_PROVIDER_SITE_OTHER): Payer: Medicare Other

## 2020-04-19 ENCOUNTER — Telehealth: Payer: Self-pay | Admitting: *Deleted

## 2020-04-19 ENCOUNTER — Ambulatory Visit (INDEPENDENT_AMBULATORY_CARE_PROVIDER_SITE_OTHER): Payer: Medicare Other

## 2020-04-19 DIAGNOSIS — D500 Iron deficiency anemia secondary to blood loss (chronic): Secondary | ICD-10-CM

## 2020-04-19 DIAGNOSIS — I5042 Chronic combined systolic (congestive) and diastolic (congestive) heart failure: Secondary | ICD-10-CM

## 2020-04-19 DIAGNOSIS — Z95810 Presence of automatic (implantable) cardiac defibrillator: Secondary | ICD-10-CM

## 2020-04-19 LAB — CBC
HCT: 23.3 % — CL (ref 36.0–46.0)
Hemoglobin: 7.7 g/dL — CL (ref 12.0–15.0)
MCHC: 33 g/dL (ref 30.0–36.0)
MCV: 90.7 fl (ref 78.0–100.0)
Platelets: 227 10*3/uL (ref 150.0–400.0)
RBC: 2.57 Mil/uL — ABNORMAL LOW (ref 3.87–5.11)
RDW: 24.7 % — ABNORMAL HIGH (ref 11.5–15.5)
WBC: 6.1 10*3/uL (ref 4.0–10.5)

## 2020-04-19 IMAGING — MG DIGITAL SCREENING BILATERAL MAMMOGRAM WITH TOMO AND CAD
8 of 14 series · 9 of 40 positions shown · non-contrast
Comparison: Previous exam(s).

CLINICAL DATA: Screening.

EXAM:
DIGITAL SCREENING BILATERAL MAMMOGRAM WITH TOMO AND CAD

[R CC synth-2D]
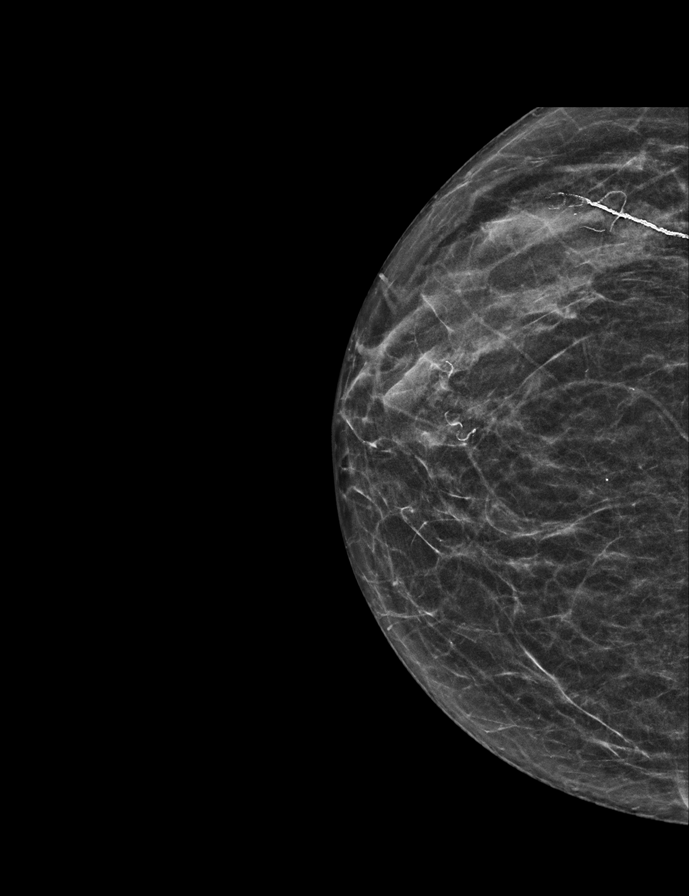

[L MLO synth-2D (1 of 4)]
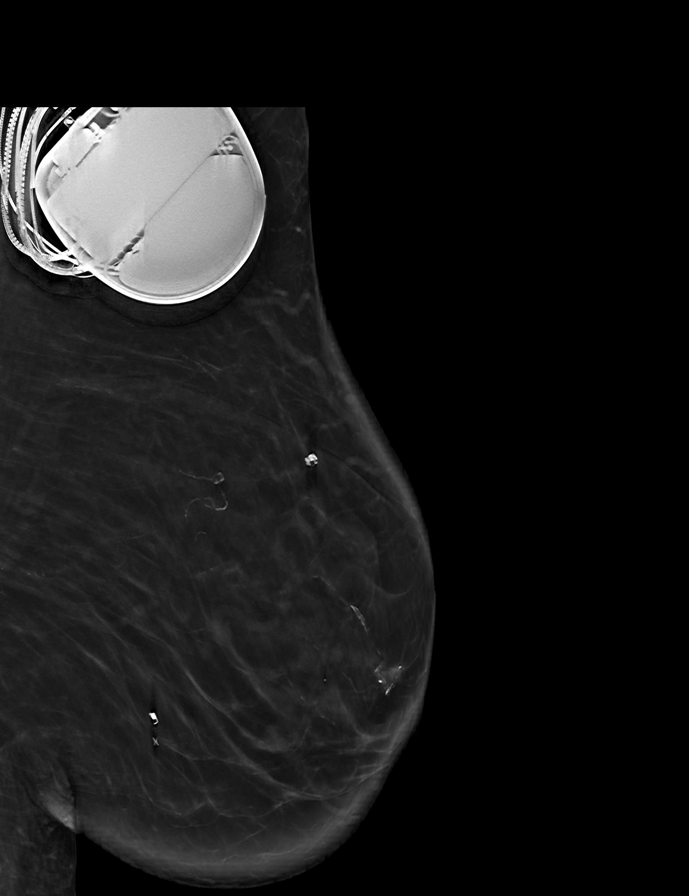

[L MLO synth-2D (2 of 4)]
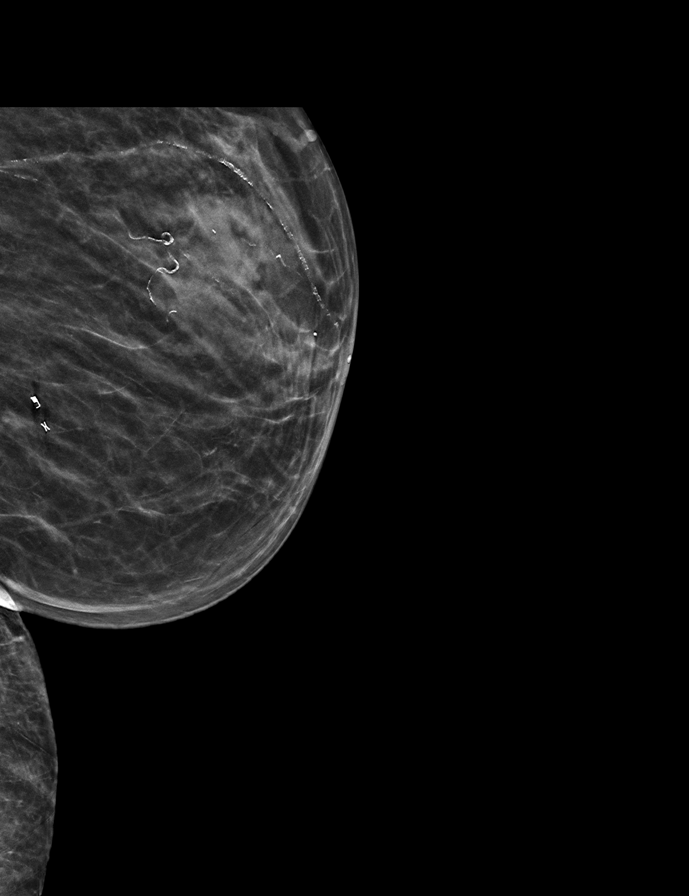

[L CC synth-2D]
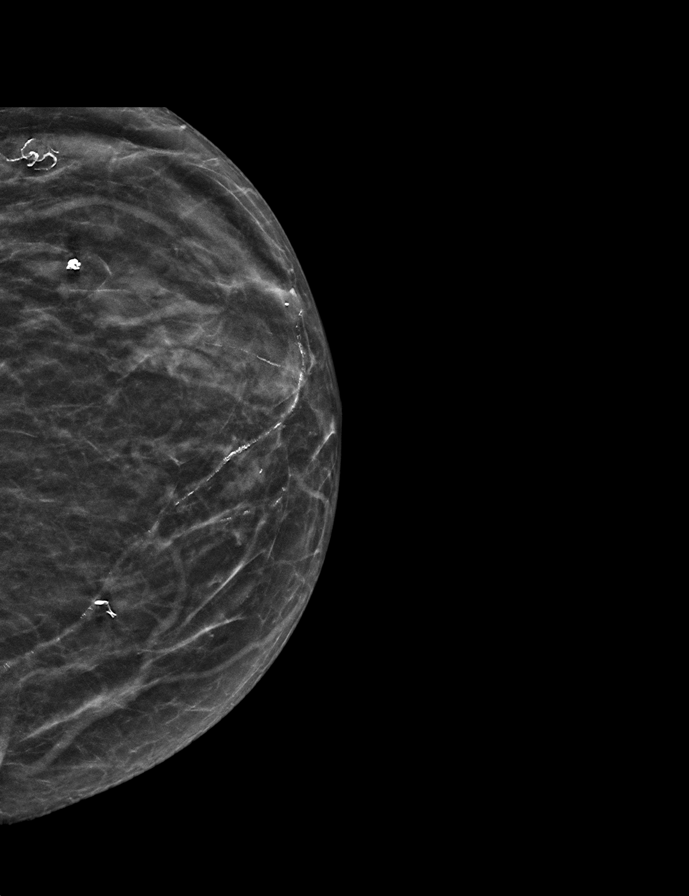

[L MLO synth-2D (3 of 4)]
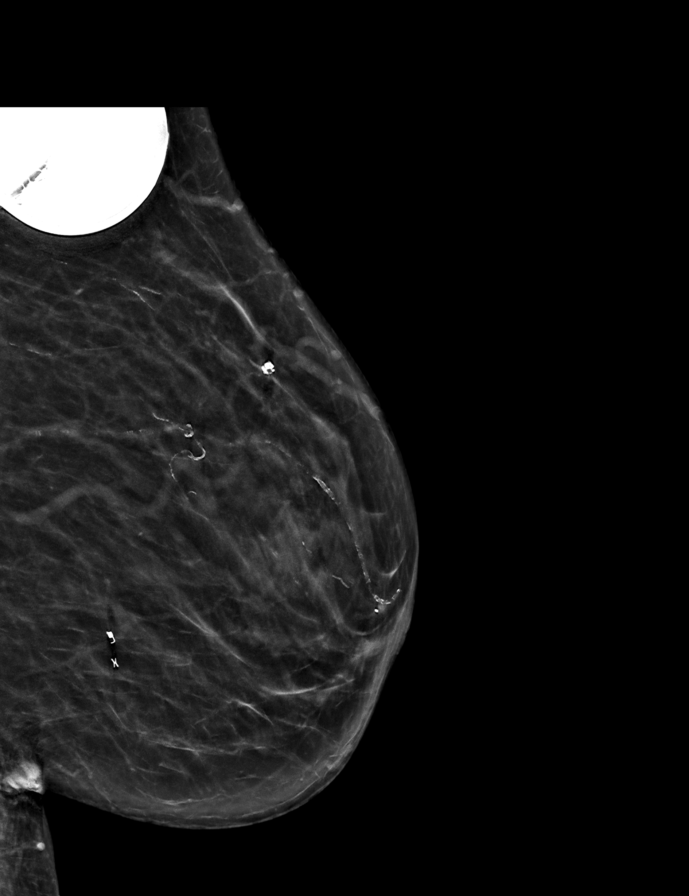

[L MLO synth-2D (4 of 4)]
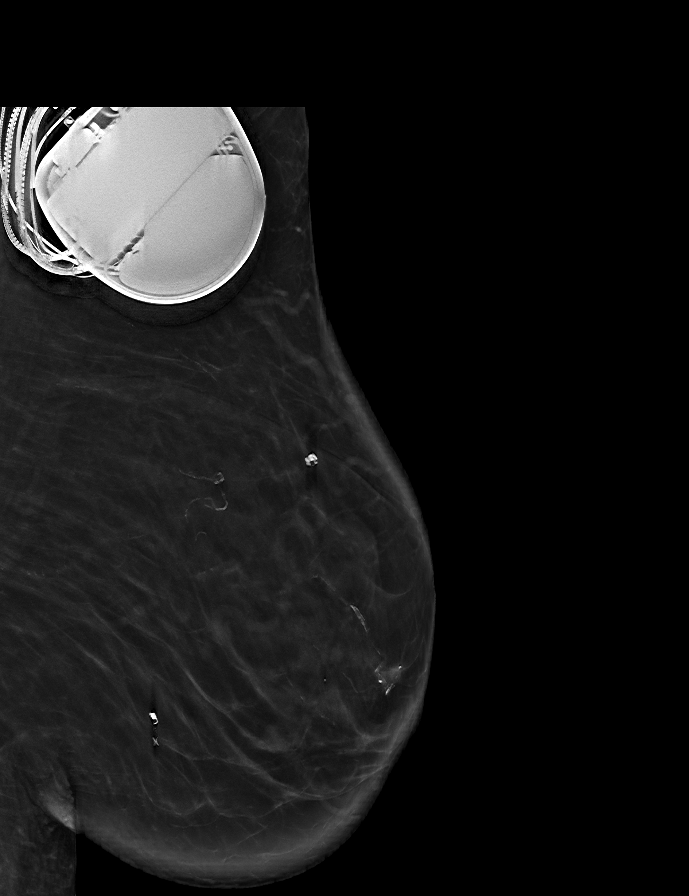

[R MLO synth-2D]
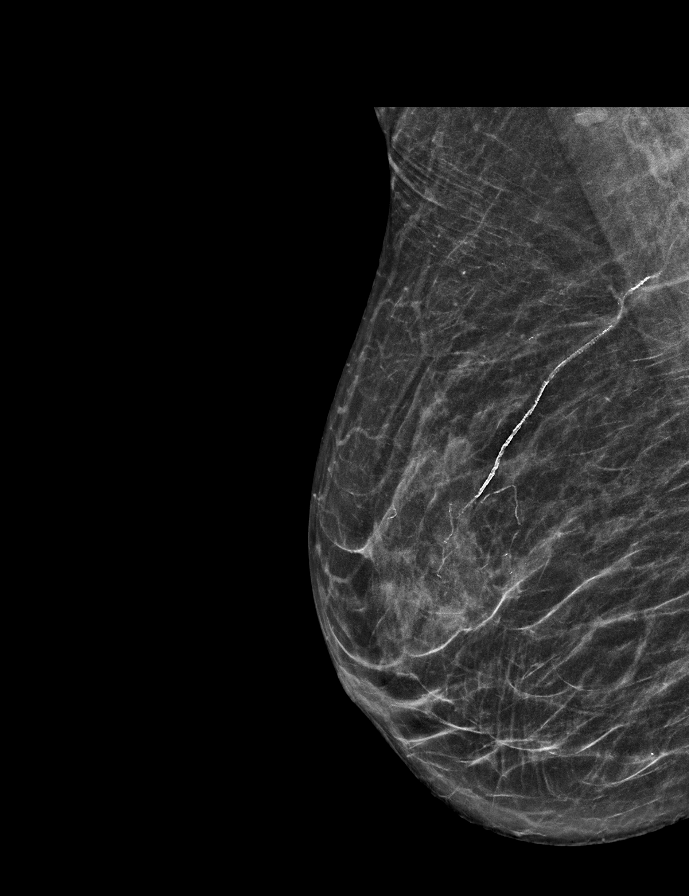

[L CC tomo · 2 of 41 frames shown]
[frame 14/41]
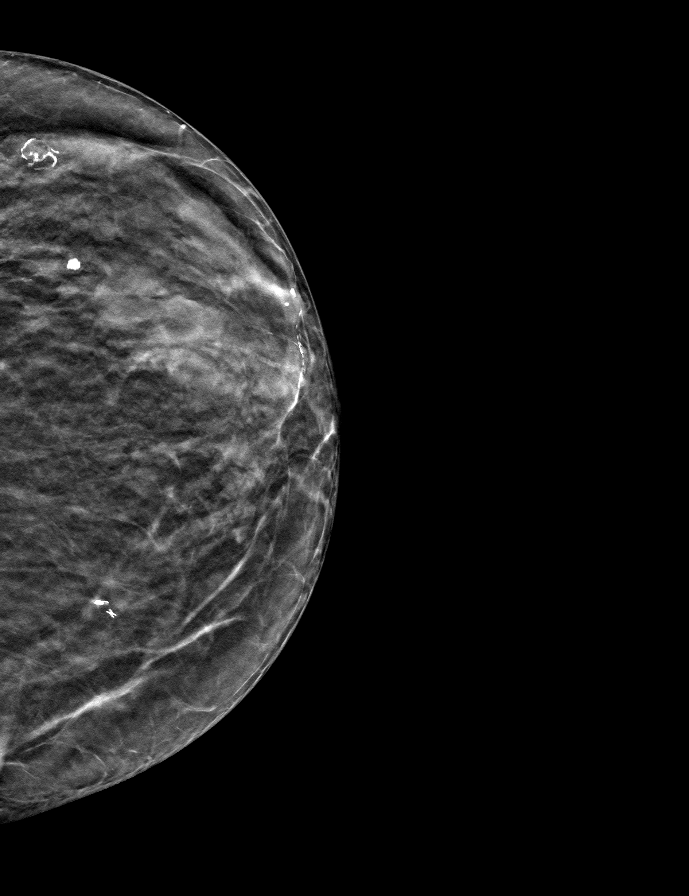
[frame 21/41]
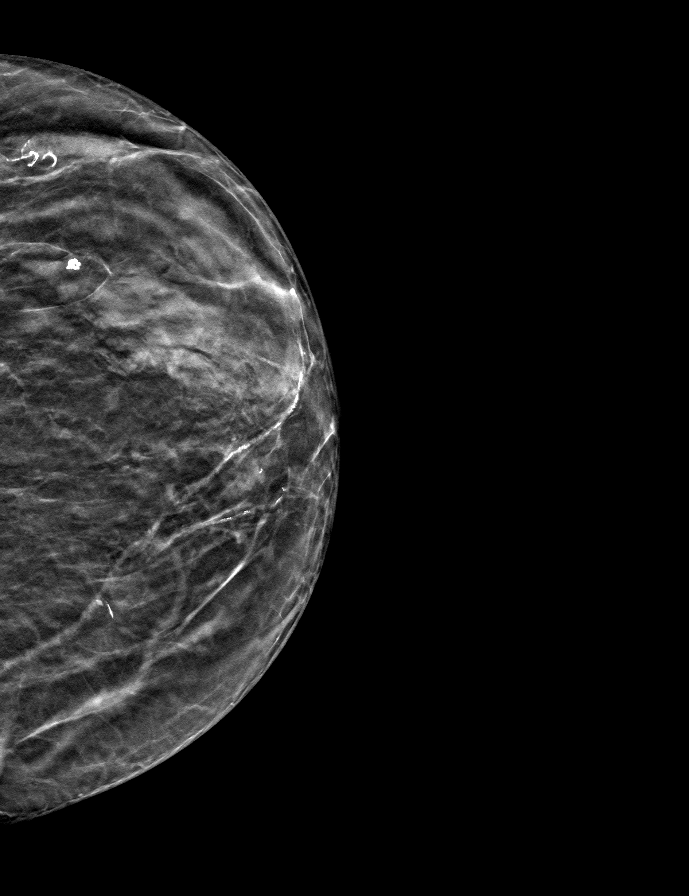

[9 of 40 positions shown; findings below may reference images not displayed]

ACR Breast Density Category c: The breast tissue is heterogeneously
dense, which may obscure small masses.
FINDINGS: There are no findings suspicious for malignancy. Cardiac pacemaker
overlies the superior left breast/left axilla. Images were processed
with CAD.
IMPRESSION: No mammographic evidence of malignancy. A result letter of this
screening mammogram will be mailed directly to the patient.

RECOMMENDATION:
Screening mammogram in one year. (Code:[id number])

BI-RADS CATEGORY  1: Negative.

## 2020-04-19 NOTE — Progress Notes (Signed)
EPIC Encounter for ICM Monitoring  Patient Name: Morgan Clay is a 74 y.o. female Date: 04/19/2020 Primary Care Physican: Billie Ruddy, MD Primary Cardiologist:[place] Electrophysiologist: Caryl Comes Nephrologist: Narda Amber Kidney Bi-V Pacing:100% 12/7/2021Weight:140lbs  Clinical Status (06-Apr-2020 to 13-Apr-2020) Time in AT/AF0.0 hr/day (0.0%)     Spoke with patient and denies fluid symptoms.  Her biggest complaint is severe weakness making it difficult to walk with has been an ongoing problem for past year.       PCP did not provide her with any suggestions.  Encouraged her ask if possible to get a Neurolgy referral from physician to evaluate weakness.   Optivol thoracic impedancereturned close to baseline since Torsemide changed to 20 mg alternating with 40 mg every other day.  Prescribed:  Torsemide20 mgTake1tablet (20 mg total)every other day alternating with 40 mg every other day.  Spironolactone 25 mg take 0.5 tablet (12.5 mg total) daily  Eliquis 5 mg take 1 tablet twice a day  Labs:  BMET scheduled for 04/21/2020 04/06/2020 Creatinine 3.54, BUN 85, Potassium 4.3, Sodium 132, GFR 13 03/17/2020 Creatinine 3.05, BUN 71, Potassium 4.2, Sodium 133, GFR 16  03/04/2020 Creatinine 2.23, BUN 43, Potassium 3.9, Sodium 135, GFR 23  03/03/2020 Creatinine2.18, BUN46, Potassium3.8, Sodium136, GFR23  03/02/2020 Creatinine2.17, BUN52, Potassium3.7, Sodium138, GFR24  03/01/2020 Creatinine2.23, BUN53, Potassium4.3, Sodium137, GFR23  02/12/2020 Creatinine2.80, BUN75, Potassium4.6, [id number]  02/04/2020 Creatinine2.24, BUN60, Potassium4.5, [id number] 01/06/2020 Creatinine 2.57, BUN 69, Potassium 4.9, Sodium 130, GFR 18-21 12/29/2019 Creatinine 2.88, BUN 80, Potassium 3.9, Sodium 132, GFR 16-18 A complete set of results can be found in Results Review.  Recommendations:Advised to limit salt and fluid intake.  Copy sent to Dr [name]  for review and recommendations.   Follow-up plan: ICM clinic phone appointment on1/02/2021.91 day device clinic remote transmission12/28/2021.  EP/Cardiology Office Visits:06/18/2020 with Dr [name].Last EP telehealth visit was 07/24/2019 (no recall)  Copy of ICM check sent to [place].    3 month ICM trend: 04/19/2020    1 Year ICM trend:       [name], RN 04/19/2020 2:08 PM

## 2020-04-19 NOTE — Telephone Encounter (Signed)
Morgan Clay from lab called indicating that patient has a critical hemoglobin of 7.7 and hematocrit of 23.3 as drawn today. Per chart, hemoglobin and hematocrit dated 04/15/20 are nearly identical. Patient is currently scheduled for capsule endoscopy in the near future.

## 2020-04-19 NOTE — Telephone Encounter (Signed)
Remote ICM transmission received.  Attempted call to patient regarding ICM remote transmission and voice mail is not set up.

## 2020-04-20 ENCOUNTER — Other Ambulatory Visit: Payer: Self-pay

## 2020-04-20 DIAGNOSIS — D649 Anemia, unspecified: Secondary | ICD-10-CM

## 2020-04-20 NOTE — Progress Notes (Signed)
Please tell her that hemoglobin is the same.  That is good but it is on the low side and I want her to get a transfusion and Dr. [name] of cardiology agrees.  Please arrange outpatient transfusion 2 units packed red cells for symptomatic anemia.  No Tylenol or Benadryl needed, she should stay on her regular medications.  3 hours for each unit of blood

## 2020-04-20 NOTE — Telephone Encounter (Signed)
Marlon Pel, RN  04/20/2020 10:57 AM EST Back to Top     Patient notified of results and recommendations First day available for transfusion is 04/27/20 8:00 at [facility]. [name] aware and okay to wait until 12/21 Patient agrees with plan and understands if she developers dizziness, SOB, or other symptoms will require ED eval    [name], MD  04/20/2020 9:01 AM EST      Please tell her that hemoglobin is the same. That is good but it is on the low side and I want her to get a transfusion and Dr. [name] of cardiology agrees.  Please arrange outpatient transfusion 2 units packed red cells for symptomatic anemia.  No Tylenol or Benadryl needed, she should stay on her regular medications.  3 hours for each unit of blood

## 2020-04-21 ENCOUNTER — Other Ambulatory Visit: Payer: Self-pay

## 2020-04-21 ENCOUNTER — Ambulatory Visit (HOSPITAL_COMMUNITY)
Admission: RE | Admit: 2020-04-21 | Discharge: 2020-04-21 | Disposition: A | Discharge status: Home / Self Care | Payer: Medicare Other | Source: Ambulatory Visit | Attending: Internal Medicine | Admitting: Internal Medicine

## 2020-04-21 DIAGNOSIS — I5042 Chronic combined systolic (congestive) and diastolic (congestive) heart failure: Secondary | ICD-10-CM | POA: Diagnosis not present

## 2020-04-21 DIAGNOSIS — I428 Other cardiomyopathies: Secondary | ICD-10-CM

## 2020-04-21 LAB — BASIC METABOLIC PANEL
Anion gap: 10 (ref 5–15)
BUN: 65 mg/dL — ABNORMAL HIGH (ref 8–23)
CO2: 20 mmol/L — ABNORMAL LOW (ref 22–32)
Calcium: 9.6 mg/dL (ref 8.9–10.3)
Chloride: 106 mmol/L (ref 98–111)
Creatinine, Ser: 3.01 mg/dL — ABNORMAL HIGH (ref 0.44–1.00)
GFR, Estimated: 16 mL/min — ABNORMAL LOW (ref >60)
Glucose, Bld: 96 mg/dL (ref 70–99)
Potassium: 4.1 mmol/L (ref 3.5–5.1)
Sodium: 136 mmol/L (ref 135–145)

## 2020-04-27 ENCOUNTER — Other Ambulatory Visit: Payer: Self-pay

## 2020-04-27 ENCOUNTER — Ambulatory Visit (HOSPITAL_COMMUNITY)
Admission: RE | Admit: 2020-04-27 | Discharge: 2020-04-27 | Disposition: A | Discharge status: Home / Self Care | Payer: Medicare Other | Source: Ambulatory Visit | Attending: Internal Medicine | Admitting: Internal Medicine

## 2020-04-27 DIAGNOSIS — D649 Anemia, unspecified: Secondary | ICD-10-CM | POA: Diagnosis not present

## 2020-04-27 LAB — PREPARE RBC (CROSSMATCH)

## 2020-04-27 MED ORDER — SODIUM CHLORIDE 0.9% IV SOLUTION: Freq: Once | INTRAVENOUS | Status: DC

## 2020-04-28 LAB — BPAM RBC
Blood Product Expiration Date: 202201232359
Blood Product Expiration Date: 202201232359
ISSUE DATE / TIME: 202112211020
ISSUE DATE / TIME: 202112211243
Unit Type and Rh: 5100
Unit Type and Rh: 5100

## 2020-04-28 LAB — TYPE AND SCREEN
ABO/RH(D): O POS
Antibody Screen: NEGATIVE
Unit division: 0
Unit division: 0

## 2020-05-03 ENCOUNTER — Encounter: Payer: Self-pay | Admitting: Internal Medicine

## 2020-05-03 ENCOUNTER — Other Ambulatory Visit (HOSPITAL_COMMUNITY)
Admission: RE | Admit: 2020-05-03 | Discharge: 2020-05-03 | Disposition: A | Discharge status: Home / Self Care | Payer: Medicare Other | Source: Ambulatory Visit | Attending: Internal Medicine | Admitting: Internal Medicine

## 2020-05-03 DIAGNOSIS — Z01812 Encounter for preprocedural laboratory examination: Secondary | ICD-10-CM | POA: Insufficient documentation

## 2020-05-03 DIAGNOSIS — U071 COVID-19: Secondary | ICD-10-CM

## 2020-05-03 HISTORY — DX: COVID-19: U07.1

## 2020-05-03 LAB — SARS CORONAVIRUS 2 (TAT 6-24 HRS): SARS Coronavirus 2: POSITIVE — AB

## 2020-05-03 NOTE — Progress Notes (Signed)
I called her and explained she is + for Covid 19  She is similar to her baseline dyspnea, cough. No sig URI sxs She is weak and has anorexia but not new  I reviewed isolation with her and recommended her daughter whom she lives with get tested.  Explained we need to postpone capsule endoscopy test until she has had 10 d quarantine Will not need a repeat Covid test for that  Also explained to call back if worsening sxs - if resp call [facility] but iof GI Sxs may call us  Does not have a PCP - will cc her CHF MD Dr. [name]  Person Under Monitoring Name: Morgan Clay  Location: [address] [place] [address]   Infection Prevention Recommendations for Individuals Confirmed to have, or Being Evaluated for, 2019 Novel Coronavirus (COVID-19) Infection Who Receive Care at Home  Individuals who are confirmed to have, or are being evaluated for, COVID-19 should follow the prevention steps below until a healthcare provider or local or state health department says they can return to normal activities.  Stay home except to get medical care You should restrict activities outside your home, except for getting medical care. Do not go to work, school, or public areas, and do not use public transportation or taxis.  Call ahead before visiting your doctor Before your medical appointment, call the healthcare provider and tell them that you have, or are being evaluated for, COVID-19 infection. This will help the healthcare provider's office take steps to keep other people from getting infected. Ask your healthcare provider to call the local or state health department.  Monitor your symptoms Seek prompt medical attention if your illness is worsening (e.g., difficulty breathing). Before going to your medical appointment, call the healthcare provider and tell them that you have, or are being evaluated for, COVID-19 infection. Ask your healthcare provider to call the local or  state health department.  Wear a facemask You should wear a facemask that covers your nose and mouth when you are in the same room with other people and when you visit a healthcare provider. People who live with or visit you should also wear a facemask while they are in the same room with you.  Separate yourself from other people in your home As much as possible, you should stay in a different room from other people in your home. Also, you should use a separate bathroom, if available.  Avoid sharing household items You should not share dishes, drinking glasses, cups, eating utensils, towels, bedding, or other items with other people in your home. After using these items, you should wash them thoroughly with soap and water.  Cover your coughs and sneezes Cover your mouth and nose with a tissue when you cough or sneeze, or you can cough or sneeze into your sleeve. Throw used tissues in a lined trash can, and immediately wash your hands with soap and water for at least 20 seconds or use an alcohol-based hand rub.  Wash your [facility] your hands often and thoroughly with soap and water for at least 20 seconds. You can use an alcohol-based hand sanitizer if soap and water are not available and if your hands are not visibly dirty. Avoid touching your eyes, nose, and mouth with unwashed hands.   Prevention Steps for Caregivers and Household Members of Individuals Confirmed to have, or Being Evaluated for, COVID-19 Infection Being Cared for in the Home  If you live with, or provide care  at home for, a person confirmed to have, or being evaluated for, COVID-19 infection please follow these guidelines to prevent infection:  Follow healthcare provider's instructions Make sure that you understand and can help the patient follow any healthcare provider instructions for all care.  Provide for the patient's basic needs You should help the patient with basic needs in the home and provide support  for getting groceries, prescriptions, and other personal needs.  Monitor the patient's symptoms If they are getting sicker, call his or her medical provider and tell them that the patient has, or is being evaluated for, COVID-19 infection. This will help the healthcare provider's office take steps to keep other people from getting infected. Ask the healthcare provider to call the local or state health department.  Limit the number of people who have contact with the patient If possible, have only one caregiver for the patient. Other household members should stay in another home or place of residence. If this is not possible, they should stay in another room, or be separated from the patient as much as possible. Use a separate bathroom, if available. Restrict visitors who do not have an essential need to be in the home.  Keep older adults, very young children, and other sick people away from the patient Keep older adults, very young children, and those who have compromised immune systems or chronic health conditions away from the patient. This includes people with chronic heart, lung, or kidney conditions, diabetes, and cancer.  Ensure good ventilation Make sure that shared spaces in the home have good air flow, such as from an air conditioner or an opened window, weather permitting.  Wash your hands often Wash your hands often and thoroughly with soap and water for at least 20 seconds. You can use an alcohol based hand sanitizer if soap and water are not available and if your hands are not visibly dirty. Avoid touching your eyes, nose, and mouth with unwashed hands. Use disposable paper towels to dry your hands. If not available, use dedicated cloth towels and replace them when they become wet.  Wear a facemask and gloves Wear a disposable facemask at all times in the room and gloves when you touch or have contact with the patient's blood, body fluids, and/or secretions or excretions, such  as sweat, saliva, sputum, nasal mucus, vomit, urine, or feces.  Ensure the mask fits over your nose and mouth tightly, and do not touch it during use. Throw out disposable facemasks and gloves after using them. Do not reuse. Wash your hands immediately after removing your facemask and gloves. If your personal clothing becomes contaminated, carefully remove clothing and launder. Wash your hands after handling contaminated clothing. Place all used disposable facemasks, gloves, and other waste in a lined container before disposing them with other household waste. Remove gloves and wash your hands immediately after handling these items.  Do not share dishes, glasses, or other household items with the patient Avoid sharing household items. You should not share dishes, drinking glasses, cups, eating utensils, towels, bedding, or other items with a patient who is confirmed to have, or being evaluated for, COVID-19 infection. After the person uses these items, you should wash them thoroughly with soap and water.  Wash laundry thoroughly Immediately remove and wash clothes or bedding that have blood, body fluids, and/or secretions or excretions, such as sweat, saliva, sputum, nasal mucus, vomit, urine, or feces, on them. Wear gloves when handling laundry from the patient. Read and follow directions on  labels of laundry or clothing items and detergent. In general, wash and dry with the warmest temperatures recommended on the label.  Clean all areas the individual has used often Clean all touchable surfaces, such as counters, tabletops, doorknobs, bathroom fixtures, toilets, phones, keyboards, tablets, and bedside tables, every day. Also, clean any surfaces that may have blood, body fluids, and/or secretions or excretions on them. Wear gloves when cleaning surfaces the patient has come in contact with. Use a diluted bleach solution (e.g., dilute bleach with 1 part bleach and 10 parts water) or a household  disinfectant with a label that says EPA-registered for coronaviruses. To make a bleach solution at home, add 1 tablespoon of bleach to 1 quart (4 cups) of water. For a larger supply, add  cup of bleach to 1 gallon (16 cups) of water. Read labels of cleaning products and follow recommendations provided on product labels. Labels contain instructions for safe and effective use of the cleaning product including precautions you should take when applying the product, such as wearing gloves or eye protection and making sure you have good ventilation during use of the product. Remove gloves and wash hands immediately after cleaning.  Monitor yourself for signs and symptoms of illness Caregivers and household members are considered close contacts, should monitor their health, and will be asked to limit movement outside of the home to the extent possible. Follow the monitoring steps for close contacts listed on the symptom monitoring form.   ? If you have additional questions, contact your local health department or call the epidemiologist on call at [phone] (available 24/7). ? This guidance is subject to change. For the most up-to-date guidance from [facility], please refer to their website: [email or web address]

## 2020-05-04 ENCOUNTER — Telehealth: Payer: Self-pay | Admitting: Internal Medicine

## 2020-05-04 ENCOUNTER — Ambulatory Visit (INDEPENDENT_AMBULATORY_CARE_PROVIDER_SITE_OTHER): Payer: Medicare Other

## 2020-05-04 DIAGNOSIS — I428 Other cardiomyopathies: Secondary | ICD-10-CM | POA: Diagnosis not present

## 2020-05-04 LAB — CUP PACEART REMOTE DEVICE CHECK
Battery Remaining Longevity: 21 mo
Battery Voltage: 2.94 V
Brady Statistic AP VP Percent: 6.33 %
Brady Statistic AP VS Percent: 0.01 %
Brady Statistic AS VP Percent: 93.66 %
Brady Statistic AS VS Percent: 0 %
Brady Statistic RA Percent Paced: 6.34 %
Brady Statistic RV Percent Paced: 99.98 %
Date Time Interrogation Session: 20211228001706
HighPow Impedance: 37 Ohm
HighPow Impedance: 60 Ohm
Implantable Lead Implant Date: 20080818
Implantable Lead Implant Date: 20080818
Implantable Lead Implant Date: 20080818
Implantable Lead Location: 753858
Implantable Lead Location: 753859
Implantable Lead Location: 753860
Implantable Lead Model: 4193
Implantable Lead Model: 5076
Implantable Lead Model: 6947
Implantable Pulse Generator Implant Date: 20170531
Lead Channel Impedance Value: 247 Ohm
Lead Channel Impedance Value: 304 Ohm
Lead Channel Impedance Value: 342 Ohm
Lead Channel Impedance Value: 399 Ohm
Lead Channel Impedance Value: 4047 Ohm
Lead Channel Impedance Value: 4047 Ohm
Lead Channel Pacing Threshold Amplitude: 1 V
Lead Channel Pacing Threshold Amplitude: 1.25 V
Lead Channel Pacing Threshold Amplitude: 1.25 V
Lead Channel Pacing Threshold Pulse Width: 0.4 ms
Lead Channel Pacing Threshold Pulse Width: 0.4 ms
Lead Channel Pacing Threshold Pulse Width: 0.4 ms
Lead Channel Sensing Intrinsic Amplitude: 3.625 mV
Lead Channel Sensing Intrinsic Amplitude: 3.625 mV
Lead Channel Sensing Intrinsic Amplitude: 9 mV
Lead Channel Sensing Intrinsic Amplitude: 9 mV
Lead Channel Setting Pacing Amplitude: 1.75 V
Lead Channel Setting Pacing Amplitude: 2 V
Lead Channel Setting Pacing Amplitude: 2.5 V
Lead Channel Setting Pacing Pulse Width: 0.4 ms
Lead Channel Setting Pacing Pulse Width: 0.4 ms
Lead Channel Setting Sensing Sensitivity: 0.3 mV

## 2020-05-04 NOTE — Progress Notes (Signed)
Patient with positive covid result. Contacted MD and informed of result.   

## 2020-05-04 NOTE — Telephone Encounter (Signed)
Morgan Clay from the [facility] is calling to report a positive COVID test result for this pt.  Pt is scheduled for a colonoscopy tomorrow 12/29 at the hospital.  CB [phone]

## 2020-05-04 NOTE — Telephone Encounter (Signed)
See results notes for details.  

## 2020-05-12 ENCOUNTER — Other Ambulatory Visit: Payer: Self-pay | Admitting: Internal Medicine

## 2020-05-13 NOTE — Telephone Encounter (Signed)
This is a CHF pt 

## 2020-05-17 NOTE — Progress Notes (Signed)
Remote ICD transmission.   

## 2020-05-18 ENCOUNTER — Telehealth: Payer: Self-pay | Admitting: Internal Medicine

## 2020-05-18 NOTE — Telephone Encounter (Signed)
Patient notified that she should go for her capsule endoscopy tomorrow.

## 2020-05-18 NOTE — Telephone Encounter (Signed)
Inbound call from patient requesting a call back from the nurse.  States she had her procedure rescheduled to 05/19/20 due to having covid last month and wants to make sure she can have procedure tomorrow.

## 2020-05-19 ENCOUNTER — Encounter (HOSPITAL_COMMUNITY)
Admission: RE | Disposition: A | Discharge status: Home / Self Care | Payer: Self-pay | Source: Ambulatory Visit | Attending: Gastroenterology

## 2020-05-19 ENCOUNTER — Observation Stay (HOSPITAL_COMMUNITY)
Admission: RE | Admit: 2020-05-19 | Discharge: 2020-05-19 | Disposition: A | Discharge status: Home / Self Care | Payer: Medicare Other | Source: Ambulatory Visit | Attending: Gastroenterology | Admitting: Gastroenterology

## 2020-05-19 ENCOUNTER — Encounter (HOSPITAL_COMMUNITY): Payer: Self-pay | Admitting: Internal Medicine

## 2020-05-19 DIAGNOSIS — E039 Hypothyroidism, unspecified: Secondary | ICD-10-CM | POA: Insufficient documentation

## 2020-05-19 DIAGNOSIS — Z7901 Long term (current) use of anticoagulants: Secondary | ICD-10-CM | POA: Diagnosis not present

## 2020-05-19 DIAGNOSIS — Z95810 Presence of automatic (implantable) cardiac defibrillator: Secondary | ICD-10-CM | POA: Diagnosis not present

## 2020-05-19 DIAGNOSIS — N184 Chronic kidney disease, stage 4 (severe): Secondary | ICD-10-CM | POA: Diagnosis not present

## 2020-05-19 DIAGNOSIS — Z8616 Personal history of COVID-19: Secondary | ICD-10-CM | POA: Diagnosis not present

## 2020-05-19 DIAGNOSIS — K5520 Angiodysplasia of colon without hemorrhage: Secondary | ICD-10-CM | POA: Insufficient documentation

## 2020-05-19 DIAGNOSIS — Z79899 Other long term (current) drug therapy: Secondary | ICD-10-CM | POA: Diagnosis not present

## 2020-05-19 DIAGNOSIS — I130 Hypertensive heart and chronic kidney disease with heart failure and stage 1 through stage 4 chronic kidney disease, or unspecified chronic kidney disease: Secondary | ICD-10-CM | POA: Insufficient documentation

## 2020-05-19 DIAGNOSIS — R195 Other fecal abnormalities: Secondary | ICD-10-CM | POA: Insufficient documentation

## 2020-05-19 DIAGNOSIS — K5730 Diverticulosis of large intestine without perforation or abscess without bleeding: Secondary | ICD-10-CM | POA: Diagnosis not present

## 2020-05-19 DIAGNOSIS — J45909 Unspecified asthma, uncomplicated: Secondary | ICD-10-CM | POA: Insufficient documentation

## 2020-05-19 DIAGNOSIS — D649 Anemia, unspecified: Secondary | ICD-10-CM | POA: Diagnosis not present

## 2020-05-19 DIAGNOSIS — I5022 Chronic systolic (congestive) heart failure: Secondary | ICD-10-CM | POA: Insufficient documentation

## 2020-05-19 DIAGNOSIS — E1122 Type 2 diabetes mellitus with diabetic chronic kidney disease: Secondary | ICD-10-CM | POA: Diagnosis not present

## 2020-05-19 DIAGNOSIS — D500 Iron deficiency anemia secondary to blood loss (chronic): Secondary | ICD-10-CM

## 2020-05-19 DIAGNOSIS — K921 Melena: Secondary | ICD-10-CM | POA: Insufficient documentation

## 2020-05-19 HISTORY — PX: GIVENS CAPSULE STUDY: SHX5432

## 2020-05-19 LAB — CBC
HCT: 25.9 % — ABNORMAL LOW (ref 36.0–46.0)
Hemoglobin: 8 g/dL — ABNORMAL LOW (ref 12.0–15.0)
MCH: 28.8 pg (ref 26.0–34.0)
MCHC: 30.9 g/dL (ref 30.0–36.0)
MCV: 93.2 fL (ref 80.0–100.0)
Platelets: 273 10*3/uL (ref 150–400)
RBC: 2.78 MIL/uL — ABNORMAL LOW (ref 3.87–5.11)
RDW: 20.5 % — ABNORMAL HIGH (ref 11.5–15.5)
WBC: 5.9 10*3/uL (ref 4.0–10.5)
nRBC: 0 % (ref 0.0–0.2)

## 2020-05-19 SURGERY — IMAGING PROCEDURE, GI TRACT, INTRALUMINAL, VIA CAPSULE

## 2020-05-19 MED ORDER — NITROGLYCERIN 0.4 MG SL SUBL: 0.4000 mg | SUBLINGUAL_TABLET | SUBLINGUAL | Status: DC | PRN

## 2020-05-19 MED ORDER — SPIRONOLACTONE 12.5 MG HALF TABLET: 12.5000 mg | ORAL_TABLET | Freq: Every day | ORAL | Status: DC

## 2020-05-19 MED ORDER — GABAPENTIN 300 MG PO CAPS: 300.0000 mg | ORAL_CAPSULE | Freq: Two times a day (BID) | ORAL | Status: DC

## 2020-05-19 MED ORDER — COLCHICINE 0.6 MG PO TABS: 0.6000 mg | ORAL_TABLET | Freq: Every day | ORAL | Status: DC

## 2020-05-19 MED ORDER — TORSEMIDE 20 MG PO TABS: 20.0000 mg | ORAL_TABLET | ORAL | Status: DC

## 2020-05-19 MED ORDER — ISOSORB DINITRATE-HYDRALAZINE 20-37.5 MG PO TABS
1.0000 | ORAL_TABLET | Freq: Three times a day (TID) | ORAL | Status: DC
  Administered 2020-05-19: 1 via ORAL
  Filled 2020-05-19: qty 1

## 2020-05-19 MED ORDER — APIXABAN 5 MG PO TABS
5.0000 mg | ORAL_TABLET | Freq: Two times a day (BID) | ORAL | Status: DC
  Administered 2020-05-19: 5 mg via ORAL
  Filled 2020-05-19: qty 1

## 2020-05-19 MED ORDER — CARVEDILOL 25 MG PO TABS
25.0000 mg | ORAL_TABLET | Freq: Two times a day (BID) | ORAL | Status: DC
  Administered 2020-05-19: 25 mg via ORAL
  Filled 2020-05-19: qty 1

## 2020-05-19 MED ORDER — PANTOPRAZOLE SODIUM 40 MG PO TBEC: 40.0000 mg | DELAYED_RELEASE_TABLET | Freq: Every day | ORAL | Status: DC

## 2020-05-19 MED ORDER — ALLOPURINOL 300 MG PO TABS: 300.0000 mg | ORAL_TABLET | Freq: Every day | ORAL | Status: DC

## 2020-05-19 MED ORDER — AMIODARONE HCL 200 MG PO TABS
200.0000 mg | ORAL_TABLET | Freq: Two times a day (BID) | ORAL | Status: DC
  Administered 2020-05-19: 200 mg via ORAL
  Filled 2020-05-19: qty 1

## 2020-05-19 MED ORDER — LEVOTHYROXINE SODIUM 137 MCG PO TABS: 137.0000 ug | ORAL_TABLET | Freq: Every day | ORAL | Status: DC

## 2020-05-19 NOTE — Progress Notes (Signed)
Pt observed on tele in Endoscopy during givens capsule study per protocol due to patient having [employer] PPM/ICD.  No issues on tele during that time.  Patient discharged home.  Vista Lawman, RN

## 2020-05-19 NOTE — Progress Notes (Signed)
Patient swallowed capsule at 0845 with no issues. Patient educated and verbalized understanding. Will continue to monitor.

## 2020-05-19 NOTE — Care Management CC44 (Signed)
Condition Code 44 Documentation Completed  Patient Details  Name: Jazalynn Mireles MRN: [id number] Date of Birth: 11-15-1945   Condition Code 44 given:  Yes Patient signature on Condition Code 44 notice:  Yes Documentation of 2 MD's agreement:  Yes Code 44 added to claim:  Yes    Fuller Mandril, RN 05/19/2020, 3:33 PM

## 2020-05-19 NOTE — H&P (Addendum)
[facility] Admission Note   Primary Care Physician:  Patient, No Pcp Per Primary Gastroenterologist:  [name]  Reason for admission: observation during capsule endoscopy.    HPI: Morgan Clay is a 75 y.o. female.  PMH listed below.  Compensated cirrhosis of the liver, trace esophageal varices.  Hep C eradicated with Harvoni. Chronic Eliquis for A. Fib.  Cardioversion 02/12/2020.  Non-ischemic CM, CHF, status post AICD.  CKD, stage 4/GFR mid 20s at baseline.  2020 sigmoid colectomy to address diverticulitis.  OSA not on CPAP.    Hx of GI bleeding and anemia work-ups in the past.  Evaluation of Hgb 6.9, dark stools during admission in late 02/2020. 10/26/2017 EGD:  For melena.  Benign, nonobstructing esophageal stenosis.  Solitary, nonbleeding AVM in stomach treated with APC.  Normal duodenum to D2 11/07/2017 EGD: For melena a week and a half following EGD w APC of gastric AVM.  Small ulcer at site of recently APCd AVM, likley source for bleeding 11/08/2017 colonoscopy: Pandiverticulosis.  4 mm polyp removed from transverse colon.  Suspect anemia related to the gastric AVM treated 2 weeks prior. 12/2017 capsule endoscopy: Complete study with fair prep.  1 medium to large, nonbleeding AVM at 2 hours 56 minutes is 1 hour and 30 minutes beyond first duodenal image.  Otherwise negative study.  This AVM not reachable by enteroscopy and if further active bleeding would need referral for deep enteroscopy 10/ 26/2021  small bowel enteroscopy showed trace column esophageal varix w/o stigmata or bleeding.  Superficial AVMs and punctate lesions in the prepylorus were obliterated with APC.  Some scope trauma in the stomach noted.  Deviously placed tattoo at the extent of prior enteroscopy noted.  Dr. Arelia Longest suspected bleeding and anemia from the gastric findings but more distal, nonvisualized, distal small bowel AVMs could also be a source. Recently  tested positive for COVID-19 as of 12/27.  This resulted in postponement of planned capsule endoscopy.  Required Feraheme and 2 PRBCs during hospitalization in late 02/2020, Hgb 6.9.  Underwent a small bowel enteroscopy detailed above.  At discharge she continued on Protonix 40 mg/daily for 30 days, ferrous sulfate 325 mg/daily and resumed her Eliquis.   Hgb 7.7 on 04/19/20.  Received 2 PRBCs on 04/28/19 at outpt center.   At outpatient GI ROV of 12/9 patient was complaining of diarrhea for some weeks, stools chronically dark even before they were loose.  [name] arranged for capsule endoscopy but it was postponed when she tested positive for COVID-19.  She has gone through convalescence/isolation and is now admitted for capsule endoscopy.  No floor beds are available due to bed shortage so the patient will be staying in the endoscopy unit for the duration of the study and discharge later this afternoon noon, early this evening.     Past Medical History:  Diagnosis Date  . A-fib ([place])    on Eliquis  . AICD (automatic cardioverter/defibrillator) present 10/06/2015  . Anemia   . ARF (acute renal failure) ([place])   . Arthritis    "qwhere" (01/03/2018)  . Asthma    reports mild asthma since childhood - had COPD on dx list from prior PCP  . Chronic bronchitis ([place])    "get it most years; not this past year though" (01/03/2018)  . Chronic kidney disease    "? stage;  followed by [facility]; [name]" (01/03/2018)  . Chronic systolic congestive heart failure ([place])   . COVID-19 05/03/2020  . DEPRESSION 12/17/2009  Annotation: PHQ-9 score = 14 done on 12/17/2009 Qualifier: Diagnosis of  By: Hassell Done FNP, Tori Milks    . Diabetes mellitus without complication (HCC)    DIET CONTROLLED   . Diverticulosis   . Enteritis   . FIBROIDS, UTERUS 03/05/2008  . GI bleeding 01/03/2018  . Glaucoma   . Gout   . Hepatitis C    HEPATITIS C - s/p treatment with Harvoni, saw hepatology, Dawn Drazek  .  History of blood transfusion ~ 11/2017  . Hyperlipemia 12/06/2012  . HYPERTENSION, BENIGN 04/24/2007  . Hypothyroidism   . LBBB (left bundle branch block)   . Lower GI bleeding    "been dealing w/it since 07/2017" (01/03/2018)  . Nonischemic cardiomyopathy ([place])   . OBESITY 05/27/2009  . OBSTRUCTIVE SLEEP APNEA 11/14/2007   no CPAP  . OSTEOPENIA 09/30/2008  . Pain and swelling of left upper extremity 08/08/2017  . Personal history of other infectious and parasitic disease    Hepatitis B  . Pneumonia    "several times" (01/03/2018)  . Pulmonary edema, acute ([place]) 08/09/2017    Past Surgical History:  Procedure Laterality Date  . APPENDECTOMY    . CARDIAC CATHETERIZATION    . CARDIOVERSION N/A 12/14/2014   Procedure: CARDIOVERSION;  Surgeon: Dorothy Spark, MD;  Location: [facility] ENDOSCOPY;  Service: Cardiovascular;  Laterality: N/A;  . CARDIOVERSION N/A 02/26/2017   Procedure: CARDIOVERSION;  Surgeon: Evans Lance, MD;  Location: [facility] CV LAB;  Service: Cardiovascular;  Laterality: N/A;  . CARDIOVERSION N/A 07/24/2017   Procedure: CARDIOVERSION;  Surgeon: Thayer Headings, MD;  Location: [facility] ENDOSCOPY;  Service: Cardiovascular;  Laterality: N/A;  . CARDIOVERSION N/A 02/12/2020   Procedure: CARDIOVERSION;  Surgeon: [name], MD;  Location: [facility] ENDOSCOPY;  Service: Cardiovascular;  Laterality: N/A;  . COLONOSCOPY N/A 11/08/2017   Procedure: COLONOSCOPY;  Surgeon: Milus Banister, MD;  Location: [facility] ENDOSCOPY;  Service: Endoscopy;  Laterality: N/A;  . DILATION AND CURETTAGE OF UTERUS    . ENTEROSCOPY N/A 03/02/2020   Procedure: ENTEROSCOPY;  Surgeon: Yetta Flock, MD;  Location: [facility] ENDOSCOPY;  Service: Gastroenterology;  Laterality: N/A;  . EP IMPLANTABLE DEVICE N/A 10/06/2015   Procedure: BIV ICD Generator Changeout;  Surgeon: [name], MD;  Location: [place] CV LAB;  Service: Cardiovascular;  Laterality: N/A;  . ESOPHAGOGASTRODUODENOSCOPY N/A 10/26/2017   Procedure:  ESOPHAGOGASTRODUODENOSCOPY (EGD);  Surgeon: Ladene Artist, MD;  Location: [facility] ENDOSCOPY;  Service: Endoscopy;  Laterality: N/A;  . ESOPHAGOGASTRODUODENOSCOPY (EGD) WITH PROPOFOL N/A 11/07/2017   Procedure: ESOPHAGOGASTRODUODENOSCOPY (EGD) WITH PROPOFOL;  Surgeon: Milus Banister, MD;  Location: [facility] ENDOSCOPY;  Service: Endoscopy;  Laterality: N/A;  . HOT HEMOSTASIS N/A 10/26/2017   Procedure: HOT HEMOSTASIS (ARGON PLASMA COAGULATION/BICAP);  Surgeon: Ladene Artist, MD;  Location: [facility] ENDOSCOPY;  Service: Endoscopy;  Laterality: N/A;  . HOT HEMOSTASIS N/A 03/02/2020   Procedure: HOT HEMOSTASIS (ARGON PLASMA COAGULATION/BICAP);  Surgeon: Yetta Flock, MD;  Location: [facility] ENDOSCOPY;  Service: Gastroenterology;  Laterality: N/A;  . INSERT / REPLACE / REMOVE PACEMAKER  ?2008  . POLYPECTOMY  11/08/2017   Procedure: POLYPECTOMY;  Surgeon: Milus Banister, MD;  Location: [facility] ENDOSCOPY;  Service: Endoscopy;;  . PROCTOSCOPY N/A 05/22/2018   Procedure: RIGID PROCTOSCOPY;  Surgeon: Michael Boston, MD;  Location: [facility] ORS;  Service: General;  Laterality: N/A;  . RIGHT HEART CATH N/A 02/13/2018   Procedure: RIGHT HEART CATH;  Surgeon: [name], MD;  Location: [facility] CV LAB;  Service:  Cardiovascular;  Laterality: N/A;  . RIGHT/LEFT HEART CATH AND CORONARY ANGIOGRAPHY N/A 12/11/2019   Procedure: RIGHT/LEFT HEART CATH AND CORONARY ANGIOGRAPHY;  Surgeon: [name], MD;  Location: [place] [facility] LAB;  Service: Cardiovascular;  Laterality: N/A;  . SUBMUCOSAL TATTOO INJECTION  03/02/2020   Procedure: SUBMUCOSAL TATTOO INJECTION;  Surgeon: Yetta Flock, MD;  Location: [facility] ENDOSCOPY;  Service: Gastroenterology;;  . TUBAL LIGATION    . XI ROBOTIC ASSISTED LOWER ANTERIOR RESECTION N/A 05/22/2018   Procedure: XI ROBOTIC ASSISTED SIGMOID COLOECTOMY MOBILIZATION OF SPLENIC FLEXURE, FIREFLY ASSESSMENT OF PERFUSION;  Surgeon: Michael Boston, MD;  Location: [facility] ORS;  Service: General;  Laterality: N/A;   ERAS PATHWAY    Prior to Admission medications   Medication Sig Start Date End Date Taking? Authorizing Provider  allopurinol (ZYLOPRIM) 300 MG tablet Take 300 mg by mouth daily.    Yes [provider]  amiodarone (PACERONE) 200 MG tablet Take 1 tablet (200 mg total) by mouth daily. Patient taking differently: Take 200 mg by mouth 2 (two) times daily. 03/15/20  Yes Fenton, Clint R, PA  apixaban (ELIQUIS) 5 MG TABS tablet Take 1 tablet (5 mg total) by mouth 2 (two) times daily. 12/12/19  Yes [name], MD  carvedilol (COREG) 25 MG tablet Take 1 tablet by mouth twice daily 05/13/20  Yes [name], MD  colchicine 0.6 MG tablet Take 0.6 mg by mouth daily. 02/06/18  Yes [provider]  ferrous sulfate 325 (65 FE) MG tablet Take 1 tablet (325 mg total) by mouth daily. 03/04/20 03/04/21 Yes [place] Eric J, DO  gabapentin (NEURONTIN) 300 MG capsule Take 1 capsule (300 mg total) by mouth 2 (two) times daily. 07/17/19  Yes Billie Ruddy, MD  isosorbide-hydrALAZINE (BIDIL) 20-37.5 MG tablet Take 1 tablet by mouth 3 (three) times daily. 12/05/19  Yes [name], MD  levothyroxine (SYNTHROID) 137 MCG tablet Take 1 tablet (137 mcg total) by mouth daily before breakfast. 03/11/20  Yes Billie Ruddy, MD  pantoprazole (PROTONIX) 40 MG tablet Take 1 tablet (40 mg total) by mouth daily. 04/06/20 04/06/21 Yes Clegg, [name] D, NP  spironolactone (ALDACTONE) 25 MG tablet Take 0.5 tablets (12.5 mg total) by mouth daily. 12/05/19 03/23/22 Yes [name], MD  torsemide (DEMADEX) 20 MG tablet Take 1 tablet (20 mg total) every other day alternating with 2 tablets (40 mg total) every other day. Patient taking differently: Take 20 mg by mouth See admin instructions. Take 1 tablet (20 mg total) every other day alternating with 2 tablets (40 mg total) every other day. 04/14/20  Yes [name], MD  traZODone (DESYREL) 50 MG tablet Take 50 mg by mouth at bedtime.   Yes [provider]  nitroGLYCERIN (NITROSTAT) 0.4 MG SL tablet Place 0.4 mg under the tongue every 5 (five) minutes x 3 doses as needed for chest pain.     [provider]  tiZANidine (ZANAFLEX) 2 MG tablet Take 1 tablet (2 mg total) by mouth at bedtime as needed for muscle spasms. 04/07/20   Billie Ruddy, MD    No current facility-administered medications for this encounter.    Allergies as of 04/16/2020  . (No Known Allergies)    Family History  Problem Relation Age of Onset  . Asthma Father   . Heart attack Father   . Asthma Sister   . Lung cancer Sister   . Heart attack Mother   . Stroke Brother   .  Colon cancer Neg Hx   . Esophageal cancer Neg Hx   . Pancreatic cancer Neg Hx   . Stomach cancer Neg Hx   . Liver disease Neg Hx     Social History   Socioeconomic History  . Marital status: Widowed    Spouse name: Not on file  . Number of children: 1  . Years of education: 17  . Highest education level: Not on file  Occupational History  . Not on file  Tobacco Use  . Smoking status: Never Smoker  . Smokeless tobacco: Never Used  Vaping Use  . Vaping Use: Never used  Substance and Sexual Activity  . Alcohol use: Yes    Comment: 01/03/2018 "couple drinks/month"  . Drug use: Yes    Types: Marijuana    Comment: VERY INTERMITTENT   . Sexual activity: Not Currently  Other Topics Concern  . Not on file  Social History Narrative   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      Home Situation: lives alone      Spiritual Beliefs: Christian - [employer]      Lifestyle: no regular exercise; poor diet      She is divorced at least one adult child   Never smoker    rare alcohol to occasional at best    no substance abuse         Social Determinants of Radio broadcast assistant Strain: Not on file  Food Insecurity: Not on file  Transportation Needs: Not on file  Physical Activity: Not on file  Stress: Not on file  Social Connections: Not on file   Intimate Partner Violence: Not on file    REVIEW OF SYSTEMS: Constitutional: Some fatigue but no profound weakness. ENT: Known nosebleeds. Pulm: Dyspnea on exertion with minor activity such as walking 5 or 10 feet at her home. CV: No angina.  No lower extremity edema GU: No dysuria, no bloody urine, no oliguria GI: No dysphagia, no abdominal pain, no heartburn.  Chronically dark stool since starting iron and several weeks of loose stools.  Does not see blood in her stools. Heme: Denies excessive bleeding or bruising.    Transfusions: See HPI.  Previous transfusions in 2019 and 05/2018. Neuro: No syncope, no dizziness, no seizures.  No headaches. Derm: No rashes, no sores, no pruritus. Endocrine: No excessive thirst or urination Immunization: COVID-19 vaccination x3.   PHYSICAL EXAM:  Temp:  [98.4 F (36.9 C)] 98.4 F (36.9 C) (01/12 0823) Pulse Rate:  [60] 60 (01/12 0823) Resp:  [11] 11 (01/12 0823) BP: (180)/(60) 180/60 (01/12 0823) SpO2:  [100 %] 100 % (01/12 0823) Weight:  [63.5 kg] 63.5 kg (01/12 0833)    General: Somewhat chronically ill-appearing but alert and pleasant, comfortable. Ears: No hearing deficit Mouth: No congestion or discharge Neck: No JVD, no masses, no thyromegaly Lungs: CTA bilaterally. Heart: Irregular/irregular.[name]  No MRG.  S1, S2 Abdomen: Soft without tenderness.  Active bowel sounds.  No HSM, masses, bruits, hernias.  Not distended.    Rectal: Deferred Msk: No joint redness, swelling or gross deformity Pulses: 2+ DP pulse. Extremities: No CCE. Neurologic: Alert and oriented x3.  Moves all 4 limbs without tremor, strength not tested. Skin: Rash, no sores, no significant bruising or purpura.  No suspicious lesions. Psych: Somewhat flat affect but pleasant, cooperative with fluid speech.  Intake/Output from previous day: No intake/output data recorded. Intake/Output this shift: No intake/output data recorded.  LAB  RESULTS:  Obtained.  RADIOLOGY STUDIES: No results found.   IMPRESSION:   *   FOBT positive anemia in patient with previous ablation of solitary gastric AVM in 11/2017, additional ablation of prepyloric AVMs in 02/2020. Received Feraheme and 1 PRBC during admit late 02/2020, 2 PRBCs at outpatient center 04/26/2020.  PLAN:  *   Capsule endoscopy today, discharge once completed this PM.    *    CBC today.        LOS: 0 days   [name]  05/19/2020, 9:17 AM Phone [phone]

## 2020-05-21 NOTE — Discharge Summary (Signed)
Discharge Summary:  Name: Morgan Clay MRN: [id number] DOB: 1946/04/23 75 y.o. PCP:  Patient, No Pcp Per  Date of Admission: 05/19/2020  7:55 AM Date of Discharge: 05/21/2020 Attending Physician: No att. providers found  Admitting Dignosis: Active Problems:   Anemia   FOBT + anemia  Discharge Diagnosis: Active Problems:   Anemia     Brief History: Patient is a 75 year old female with history hep C, eradicated w Harvoni.  Compensated cirrhosis of the liver, trace esophageal varices.  Takes Eliquis for A. fib.  Nonischemic cardiomyopathy, CHF, ICD in place.  Stage IV CKD.  2020 sigmoid colectomy to address diverticulitis. Has undergone several EGDs, colonoscopy, small bowel enteroscopy, capsule endoscopy for evaluation of recurrent GI bleeding/heme positive stools and anemia. Latest small bowel enteroscopy performed on 03/02/2020 for recurrent FOBT positive anemia.  This revealed trace esophageal varix, no stigmata of bleeding.  Nonbleeding, superficial AVMs and punctate lesions at the prepylorus were obliterated with APC.  [name] suspected the source of the bleeding were distal, nonvisualized small bowel AVMs.  During the October admission she received 2 PRBCs and infusion of Feraheme.  Resumed Eliquis at discharge.  Hb 7.7 on 04/19/2020 and she was transfused 2 PRBCs 04/27/2020 at outpt day hospital.  Dr Carlean Purl arranged for capsule endoscopy.  This was delayed due to patient testing positive for COVID-19 in late December but was arranged for 05/19/2020. Because she has ICD in place she required telemetry observation for the duration of the capsule study.  Hospital Course: Patient underwent uneventful capsule study starting the morning of 1/12.  She was discharged around 5 PM that evening. Hgb  was assayed during the admission and measured 8.   [name] will be reading the capsule study and arrange for any necessary repeat labs, future office visits, future endoscopic procedures.   Patient was stable at discharge Eliquis was not on hold.  Previous Medical/Surgical history Past Medical History:  Diagnosis Date  . A-fib ([place])    on Eliquis  . AICD (automatic cardioverter/defibrillator) present 10/06/2015  . Anemia   . ARF (acute renal failure) ([place])   . Arthritis    "qwhere" (01/03/2018)  . Asthma    reports mild asthma since childhood - had COPD on dx list from prior PCP  . Chronic bronchitis ([place])    "get it most years; not this past year though" (01/03/2018)  . Chronic kidney disease    "? stage;  followed by [facility]; [name]" (01/03/2018)  . Chronic systolic congestive heart failure ([place])   . COVID-19 05/03/2020  . DEPRESSION 12/17/2009   Annotation: PHQ-9 score = 14 done on 12/17/2009 Qualifier: Diagnosis of  By: Hassell Done FNP, Tori Milks    . Diabetes mellitus without complication (HCC)    DIET CONTROLLED   . Diverticulosis   . Enteritis   . FIBROIDS, UTERUS 03/05/2008  . GI bleeding 01/03/2018  . Glaucoma   . Gout   . Hepatitis C    HEPATITIS C - s/p treatment with Harvoni, saw hepatology, Dawn Drazek  . History of blood transfusion ~ 11/2017  . Hyperlipemia 12/06/2012  .  HYPERTENSION, BENIGN 04/24/2007  . Hypothyroidism   . LBBB (left bundle branch block)   . Lower GI bleeding    "been dealing w/it since 07/2017" (01/03/2018)  . Nonischemic cardiomyopathy ([place])   . OBESITY 05/27/2009  . OBSTRUCTIVE SLEEP APNEA 11/14/2007   no CPAP  . OSTEOPENIA 09/30/2008  . Pain and swelling of left upper extremity 08/08/2017  . Personal history of other infectious and parasitic disease    Hepatitis B  . Pneumonia    "several times" (01/03/2018)  . Pulmonary edema, acute ([place]) 08/09/2017   Past Surgical History:  Procedure Laterality Date  . APPENDECTOMY    .  CARDIAC CATHETERIZATION    . CARDIOVERSION N/A 12/14/2014   Procedure: CARDIOVERSION;  Surgeon: Dorothy Spark, MD;  Location: [facility] ENDOSCOPY;  Service: Cardiovascular;  Laterality: N/A;  . CARDIOVERSION N/A 02/26/2017   Procedure: CARDIOVERSION;  Surgeon: Evans Lance, MD;  Location: [place] CV LAB;  Service: Cardiovascular;  Laterality: N/A;  . CARDIOVERSION N/A 07/24/2017   Procedure: CARDIOVERSION;  Surgeon: Thayer Headings, MD;  Location: [facility] ENDOSCOPY;  Service: Cardiovascular;  Laterality: N/A;  . CARDIOVERSION N/A 02/12/2020   Procedure: CARDIOVERSION;  Surgeon: [name], MD;  Location: [facility] ENDOSCOPY;  Service: Cardiovascular;  Laterality: N/A;  . COLONOSCOPY N/A 11/08/2017   Procedure: COLONOSCOPY;  Surgeon: Milus Banister, MD;  Location: [facility] ENDOSCOPY;  Service: Endoscopy;  Laterality: N/A;  . DILATION AND CURETTAGE OF UTERUS    . ENTEROSCOPY N/A 03/02/2020   Procedure: ENTEROSCOPY;  Surgeon: Yetta Flock, MD;  Location: [facility] ENDOSCOPY;  Service: Gastroenterology;  Laterality: N/A;  . EP IMPLANTABLE DEVICE N/A 10/06/2015   Procedure: BIV ICD Generator Changeout;  Surgeon: [name], MD;  Location: [place] CV LAB;  Service: Cardiovascular;  Laterality: N/A;  . ESOPHAGOGASTRODUODENOSCOPY N/A 10/26/2017   Procedure: ESOPHAGOGASTRODUODENOSCOPY (EGD);  Surgeon: Ladene Artist, MD;  Location: [facility] ENDOS[facility];  Service: Endoscopy;  Laterality: N/A;  . ESOPHAGOGASTRODUODENOSCOPY (EGD) WITH PROPOFOL N/A 11/07/2017   Procedure: ESOPHAGOGASTRODUODENOSCOPY (EGD) WITH PROPOFOL;  Surgeon: Milus Banister, MD;  Location: [facility] ENDOSCOPY;  Service: Endoscopy;  Laterality: N/A;  . GIVENS CAPSULE STUDY N/A 05/19/2020   Procedure: GIVENS CAPSULE STUDY;  Surgeon: [name], MD;  Location: [place];  Service: Endoscopy;  Laterality: N/A;  .adm for obs since pacemaker, PA wil enter order and see pt  . HOT HEMOSTASIS N/A 10/26/2017   Procedure: HOT HEMOSTASIS (ARGON PLASMA  COAGULATION/BICAP);  Surgeon: Ladene Artist, MD;  Location: [facility] ENDOSCOPY;  Service: Endoscopy;  Laterality: N/A;  . HOT HEMOSTASIS N/A 03/02/2020   Procedure: HOT HEMOSTASIS (ARGON PLASMA COAGULATION/BICAP);  Surgeon: Yetta Flock, MD;  Location: [facility] ENDOSCOPY;  Service: Gastroenterology;  Laterality: N/A;  . INSERT / REPLACE / REMOVE PACEMAKER  ?2008  . POLYPECTOMY  11/08/2017   Procedure: POLYPECTOMY;  Surgeon: Milus Banister, MD;  Location: [facility] ENDOSCOPY;  Service: Endoscopy;;  . PROCTOSCOPY N/A 05/22/2018   Procedure: RIGID PROCTOSCOPY;  Surgeon: Michael Boston, MD;  Location: [facility] ORS;  Service: General;  Laterality: N/A;  . RIGHT HEART CATH N/A 02/13/2018   Procedure: RIGHT HEART CATH;  Surgeon: [name], MD;  Location: [place] CV LAB;  Service: Cardiovascular;  Laterality: N/A;  . RIGHT/LEFT HEART CATH AND CORONARY ANGIOGRAPHY N/A 12/11/2019   Procedure: RIGHT/LEFT HEART CATH AND CORONARY ANGIOGRAPHY;  Surgeon: [name], MD;  Location: [place] CV LAB;  Service: Cardiovascular;  Laterality: N/A;  . SUBMUCOSAL TATTOO INJECTION  03/02/2020  Procedure: SUBMUCOSAL TATTOO INJECTION;  Surgeon: Yetta Flock, MD;  Location: [facility] ENDOSCOPY;  Service: Gastroenterology;;  . TUBAL LIGATION    . XI ROBOTIC ASSISTED LOWER ANTERIOR RESECTION N/A 05/22/2018   Procedure: XI ROBOTIC ASSISTED SIGMOID COLOECTOMY MOBILIZATION OF SPLENIC FLEXURE, FIREFLY ASSESSMENT OF PERFUSION;  Surgeon: Michael Boston, MD;  Location: [facility] ORS;  Service: General;  Laterality: N/A;  ERAS PATHWAY      Wt Readings from Last 1 Encounters:  05/19/20 63.5 kg    Discharge Medications: Allergies as of 05/19/2020   No Known Allergies     Medication List    TAKE these medications   allopurinol 300 MG tablet Commonly known as: ZYLOPRIM Take 300 mg by mouth daily.   amiodarone 200 MG tablet Commonly known as: PACERONE Take 1 tablet (200 mg total) by mouth daily. What changed: when to take  this   apixaban 5 MG Tabs tablet Commonly known as: Eliquis Take 1 tablet (5 mg total) by mouth 2 (two) times daily.   BiDil 20-37.5 MG tablet Generic drug: isosorbide-hydrALAZINE Take 1 tablet by mouth 3 (three) times daily.   carvedilol 25 MG tablet Commonly known as: COREG Take 1 tablet by mouth twice daily   colchicine 0.6 MG tablet Take 0.6 mg by mouth daily.   ferrous sulfate 325 (65 FE) MG tablet Take 1 tablet (325 mg total) by mouth daily.   gabapentin 300 MG capsule Commonly known as: NEURONTIN Take 1 capsule (300 mg total) by mouth 2 (two) times daily.   levothyroxine 137 MCG tablet Commonly known as: SYNTHROID Take 1 tablet (137 mcg total) by mouth daily before breakfast.   nitroGLYCERIN 0.4 MG SL tablet Commonly known as: NITROSTAT Place 0.4 mg under the tongue every 5 (five) minutes x 3 doses as needed for chest pain.   pantoprazole 40 MG tablet Commonly known as: Protonix Take 1 tablet (40 mg total) by mouth daily.   spironolactone 25 MG tablet Commonly known as: ALDACTONE Take 0.5 tablets (12.5 mg total) by mouth daily.   tiZANidine 2 MG tablet Commonly known as: ZANAFLEX Take 1 tablet (2 mg total) by mouth at bedtime as needed for muscle spasms.   torsemide 20 MG tablet Commonly known as: DEMADEX Take 1 tablet (20 mg total) every other day alternating with 2 tablets (40 mg total) every other day. What changed:   how much to take  how to take this  when to take this   traZODone 50 MG tablet Commonly known as: DESYREL Take 50 mg by mouth at bedtime.        Procedures Performed:  Capsule endsocopy 1/12.     Discharge Labs: CBC    Component Value Date/Time   WBC 5.9 05/19/2020 1224   RBC 2.78 (L) 05/19/2020 1224   HGB 8.0 (L) 05/19/2020 1224   HGB 11.0 (L) 06/19/2019 1010   HGB 8.4 (L) 10/04/2017 1534   HCT 25.9 (L) 05/19/2020 1224   HCT 26.1 (L) 10/04/2017 1534   PLT 273 05/19/2020 1224   PLT 192 06/19/2019 1010   PLT 201  10/04/2017 1534   MCV 93.2 05/19/2020 1224   MCV 87 10/04/2017 1534   MCH 28.8 05/19/2020 1224   MCHC 30.9 05/19/2020 1224   RDW 20.5 (H) 05/19/2020 1224   RDW 17.3 (H) 10/04/2017 1534   LYMPHSABS 0.8 04/15/2020 1203   LYMPHSABS 2.1 10/04/2017 1534   MONOABS 1.0 04/15/2020 1203   EOSABS 0.1 04/15/2020 1203   EOSABS 0.2 10/04/2017 1534  BASOSABS 0.1 04/15/2020 1203   BASOSABS 0.0 10/04/2017 1534    Disposition and follow-up:   Ms.Morgan Clay was discharged from  in healthy condition.  Follow up Appointments:  To be determined by Dr Carlean Purl.     Discharge Instructions    Diet - low sodium heart healthy   Complete by: As directed         Time Spent on discharge:  20 minutes   Signed: Mordecai Maes   Phone [phone] 05/21/2020, 12:40 PM

## 2020-05-24 ENCOUNTER — Ambulatory Visit (INDEPENDENT_AMBULATORY_CARE_PROVIDER_SITE_OTHER): Payer: Medicare Other

## 2020-05-24 DIAGNOSIS — Z95810 Presence of automatic (implantable) cardiac defibrillator: Secondary | ICD-10-CM

## 2020-05-24 DIAGNOSIS — I428 Other cardiomyopathies: Secondary | ICD-10-CM

## 2020-05-24 DIAGNOSIS — I5042 Chronic combined systolic (congestive) and diastolic (congestive) heart failure: Secondary | ICD-10-CM | POA: Diagnosis not present

## 2020-05-24 MED ORDER — TORSEMIDE 20 MG PO TABS: 40.0000 mg | ORAL_TABLET | Freq: Every day | ORAL | 3 refills | Status: DC

## 2020-05-24 NOTE — Progress Notes (Signed)
Spoke with patient.  Advised of Dr Claris Gladden recommendations to take Torsemide 40 mg daily and BMET 05/28/2020.  She verbalized understanding and agreed to BMET scheduled for 1/21/2022at 3:45 PM  Advised if any changes in condition to call Dr Claris Gladden office.

## 2020-05-24 NOTE — Progress Notes (Signed)
Increase torsemide to 40 mg daily.  BMET Friday.

## 2020-05-24 NOTE — Progress Notes (Signed)
EPIC Encounter for ICM Monitoring  Patient Name: Morgan Clay is a 75 y.o. female Date: 05/24/2020 Primary Care Physican: Patient, No Pcp Per Primary Cardiologist:[place] Electrophysiologist: Caryl Comes Nephrologist: [facility] Pacing:100% 1/17/2022Weight:140lbs  Time in AT/AF0.0 hr/day (0.0%)  Spoke with patient and reports she is feeling fine. She denies any fluid symptoms  She has ongoing problem with Anemia and HGB was 7.7 in December.  She received 2 pints of blood a couple of weeks ago.    Optivol thoracic impedancesuggesting possible fluid accumulation since 04/05/2020.  Fluid Index > threshold starting 04/17/2020.  Prescribed:  Torsemide20 mgTake1tablet (20 mg total)every other day alternating with 40 mg every other day.  Spironolactone 25 mg take 0.5 tablet (12.5 mg total) daily  Eliquis 5 mg take 1 tablet twice a day  Labs:   04/21/2021 Creatinine 3.01, BUN 65, Potassium 4.1, Sodium 136, GFR 16 04/06/2020 Creatinine3.54, BUN85, Potassium4.3, Sodium132, GFR13 03/17/2020 Creatinine3.05, BUN71, Potassium4.2, Sodium133, VNR04  A complete set of results can be found in Results Review.  Recommendations:Copy sent to Dr [name] for review and recommendations.  Advised if she develops any fluid symptoms to call the office.   Follow-up plan: ICM clinic phone appointment on2/11/2020 to recheck fluid levels.91 day device clinic remote transmission3/29/2022.  EP/Cardiology Office Visits:06/18/2020 with Dr [name].Last EP telehealth visit was 07/24/2019 (no recall)  Copy of ICM check sent to [place].  3 month ICM trend: 05/24/2020.    1 Year ICM trend:       [name], RN 05/24/2020 10:39 AM

## 2020-05-27 ENCOUNTER — Ambulatory Visit (INDEPENDENT_AMBULATORY_CARE_PROVIDER_SITE_OTHER)
Admission: RE | Admit: 2020-05-27 | Discharge: 2020-05-27 | Disposition: A | Discharge status: Home / Self Care | Payer: Medicare Other | Source: Ambulatory Visit | Attending: Internal Medicine | Admitting: Internal Medicine

## 2020-05-27 ENCOUNTER — Other Ambulatory Visit (INDEPENDENT_AMBULATORY_CARE_PROVIDER_SITE_OTHER): Payer: Medicare Other

## 2020-05-27 ENCOUNTER — Inpatient Hospital Stay (HOSPITAL_COMMUNITY)
Admission: EM | Admit: 2020-05-27 | Discharge: 2020-06-03 | DRG: 378 | Disposition: A | Discharge status: Home / Self Care | Payer: Medicare Other | Attending: Family Medicine | Admitting: Family Medicine

## 2020-05-27 ENCOUNTER — Telehealth: Payer: Self-pay | Admitting: Internal Medicine

## 2020-05-27 ENCOUNTER — Encounter (HOSPITAL_COMMUNITY): Payer: Self-pay | Admitting: Internal Medicine

## 2020-05-27 ENCOUNTER — Telehealth: Payer: Self-pay

## 2020-05-27 ENCOUNTER — Emergency Department (HOSPITAL_COMMUNITY): Payer: Medicare Other

## 2020-05-27 ENCOUNTER — Other Ambulatory Visit: Payer: Self-pay

## 2020-05-27 DIAGNOSIS — I428 Other cardiomyopathies: Secondary | ICD-10-CM | POA: Diagnosis not present

## 2020-05-27 DIAGNOSIS — E1122 Type 2 diabetes mellitus with diabetic chronic kidney disease: Secondary | ICD-10-CM | POA: Diagnosis present

## 2020-05-27 DIAGNOSIS — U071 COVID-19: Secondary | ICD-10-CM

## 2020-05-27 DIAGNOSIS — D649 Anemia, unspecified: Secondary | ICD-10-CM

## 2020-05-27 DIAGNOSIS — E1129 Type 2 diabetes mellitus with other diabetic kidney complication: Secondary | ICD-10-CM | POA: Diagnosis present

## 2020-05-27 DIAGNOSIS — N184 Chronic kidney disease, stage 4 (severe): Secondary | ICD-10-CM | POA: Diagnosis not present

## 2020-05-27 DIAGNOSIS — I5033 Acute on chronic diastolic (congestive) heart failure: Secondary | ICD-10-CM | POA: Diagnosis not present

## 2020-05-27 DIAGNOSIS — Z79899 Other long term (current) drug therapy: Secondary | ICD-10-CM | POA: Diagnosis not present

## 2020-05-27 DIAGNOSIS — Z7901 Long term (current) use of anticoagulants: Secondary | ICD-10-CM

## 2020-05-27 DIAGNOSIS — T189XXA Foreign body of alimentary tract, part unspecified, initial encounter: Secondary | ICD-10-CM

## 2020-05-27 DIAGNOSIS — Z8616 Personal history of COVID-19: Secondary | ICD-10-CM | POA: Diagnosis not present

## 2020-05-27 DIAGNOSIS — I482 Chronic atrial fibrillation, unspecified: Secondary | ICD-10-CM | POA: Diagnosis present

## 2020-05-27 DIAGNOSIS — W19XXXA Unspecified fall, initial encounter: Secondary | ICD-10-CM

## 2020-05-27 DIAGNOSIS — Z79890 Hormone replacement therapy (postmenopausal): Secondary | ICD-10-CM

## 2020-05-27 DIAGNOSIS — E785 Hyperlipidemia, unspecified: Secondary | ICD-10-CM | POA: Diagnosis present

## 2020-05-27 DIAGNOSIS — G4733 Obstructive sleep apnea (adult) (pediatric): Secondary | ICD-10-CM | POA: Diagnosis present

## 2020-05-27 DIAGNOSIS — K5520 Angiodysplasia of colon without hemorrhage: Secondary | ICD-10-CM | POA: Diagnosis not present

## 2020-05-27 DIAGNOSIS — I5042 Chronic combined systolic (congestive) and diastolic (congestive) heart failure: Secondary | ICD-10-CM | POA: Diagnosis present

## 2020-05-27 DIAGNOSIS — K7469 Other cirrhosis of liver: Secondary | ICD-10-CM

## 2020-05-27 DIAGNOSIS — K921 Melena: Secondary | ICD-10-CM | POA: Diagnosis not present

## 2020-05-27 DIAGNOSIS — K922 Gastrointestinal hemorrhage, unspecified: Secondary | ICD-10-CM | POA: Diagnosis present

## 2020-05-27 DIAGNOSIS — D62 Acute posthemorrhagic anemia: Secondary | ICD-10-CM | POA: Diagnosis not present

## 2020-05-27 DIAGNOSIS — B1920 Unspecified viral hepatitis C without hepatic coma: Secondary | ICD-10-CM

## 2020-05-27 DIAGNOSIS — R296 Repeated falls: Secondary | ICD-10-CM | POA: Diagnosis present

## 2020-05-27 DIAGNOSIS — E039 Hypothyroidism, unspecified: Secondary | ICD-10-CM | POA: Diagnosis present

## 2020-05-27 DIAGNOSIS — I5032 Chronic diastolic (congestive) heart failure: Secondary | ICD-10-CM | POA: Diagnosis present

## 2020-05-27 DIAGNOSIS — N179 Acute kidney failure, unspecified: Secondary | ICD-10-CM | POA: Diagnosis not present

## 2020-05-27 DIAGNOSIS — K5521 Angiodysplasia of colon with hemorrhage: Principal | ICD-10-CM | POA: Diagnosis present

## 2020-05-27 DIAGNOSIS — I4819 Other persistent atrial fibrillation: Secondary | ICD-10-CM | POA: Diagnosis not present

## 2020-05-27 DIAGNOSIS — K31819 Angiodysplasia of stomach and duodenum without bleeding: Secondary | ICD-10-CM | POA: Diagnosis present

## 2020-05-27 DIAGNOSIS — M25552 Pain in left hip: Secondary | ICD-10-CM | POA: Diagnosis present

## 2020-05-27 DIAGNOSIS — K7460 Unspecified cirrhosis of liver: Secondary | ICD-10-CM | POA: Diagnosis present

## 2020-05-27 DIAGNOSIS — R0781 Pleurodynia: Secondary | ICD-10-CM

## 2020-05-27 DIAGNOSIS — I130 Hypertensive heart and chronic kidney disease with heart failure and stage 1 through stage 4 chronic kidney disease, or unspecified chronic kidney disease: Secondary | ICD-10-CM | POA: Diagnosis present

## 2020-05-27 DIAGNOSIS — I2510 Atherosclerotic heart disease of native coronary artery without angina pectoris: Secondary | ICD-10-CM | POA: Diagnosis present

## 2020-05-27 DIAGNOSIS — K5732 Diverticulitis of large intestine without perforation or abscess without bleeding: Secondary | ICD-10-CM | POA: Diagnosis not present

## 2020-05-27 DIAGNOSIS — Z95810 Presence of automatic (implantable) cardiac defibrillator: Secondary | ICD-10-CM | POA: Diagnosis not present

## 2020-05-27 DIAGNOSIS — I255 Ischemic cardiomyopathy: Secondary | ICD-10-CM | POA: Diagnosis present

## 2020-05-27 DIAGNOSIS — Z9049 Acquired absence of other specified parts of digestive tract: Secondary | ICD-10-CM

## 2020-05-27 DIAGNOSIS — M8580 Other specified disorders of bone density and structure, unspecified site: Secondary | ICD-10-CM | POA: Diagnosis present

## 2020-05-27 DIAGNOSIS — I4892 Unspecified atrial flutter: Secondary | ICD-10-CM | POA: Diagnosis present

## 2020-05-27 DIAGNOSIS — I5043 Acute on chronic combined systolic (congestive) and diastolic (congestive) heart failure: Secondary | ICD-10-CM | POA: Diagnosis not present

## 2020-05-27 HISTORY — DX: COVID-19: U07.1

## 2020-05-27 LAB — CBC
HCT: 15.7 % — CL (ref 36.0–46.0)
HCT: 16.8 % — ABNORMAL LOW (ref 36.0–46.0)
Hemoglobin: 5.1 g/dL — CL (ref 12.0–15.0)
Hemoglobin: 5.2 g/dL — CL (ref 12.0–15.0)
MCH: 29.1 pg (ref 26.0–34.0)
MCHC: 32.3 g/dL (ref 30.0–36.0)
MCHC: 31 g/dL (ref 30.0–36.0)
MCV: 90.6 fl (ref 78.0–100.0)
MCV: 93.9 fL (ref 80.0–100.0)
Platelets: 191 10*3/uL (ref 150.0–400.0)
Platelets: 217 10*3/uL (ref 150–400)
RBC: 1.74 Mil/uL — ABNORMAL LOW (ref 3.87–5.11)
RBC: 1.79 MIL/uL — ABNORMAL LOW (ref 3.87–5.11)
RDW: 21.4 % — ABNORMAL HIGH (ref 11.5–15.5)
RDW: 20.9 % — ABNORMAL HIGH (ref 11.5–15.5)
WBC: 6.4 10*3/uL (ref 4.0–10.5)
WBC: 7.1 10*3/uL (ref 4.0–10.5)
nRBC: 0 % (ref 0.0–0.2)

## 2020-05-27 LAB — BASIC METABOLIC PANEL
BUN: 67 mg/dL — ABNORMAL HIGH (ref 6–23)
CO2: 23 mEq/L (ref 19–32)
Calcium: 9 mg/dL (ref 8.4–10.5)
Chloride: 106 mEq/L (ref 96–112)
Creatinine, Ser: 2.78 mg/dL — ABNORMAL HIGH (ref 0.40–1.20)
GFR: 16.52 mL/min — ABNORMAL LOW (ref >60.00)
Glucose, Bld: 105 mg/dL — ABNORMAL HIGH (ref 70–99)
Potassium: 4.5 mEq/L (ref 3.5–5.1)
Sodium: 138 mEq/L (ref 135–145)

## 2020-05-27 LAB — COMPREHENSIVE METABOLIC PANEL
ALT: 14 U/L (ref 0–44)
AST: 18 U/L (ref 15–41)
Albumin: 3.6 g/dL (ref 3.5–5.0)
Alkaline Phosphatase: 75 U/L (ref 38–126)
Anion gap: 13 (ref 5–15)
BUN: 66 mg/dL — ABNORMAL HIGH (ref 8–23)
CO2: 20 mmol/L — ABNORMAL LOW (ref 22–32)
Calcium: 9 mg/dL (ref 8.9–10.3)
Chloride: 104 mmol/L (ref 98–111)
Creatinine, Ser: 2.89 mg/dL — ABNORMAL HIGH (ref 0.44–1.00)
GFR, Estimated: 17 mL/min — ABNORMAL LOW (ref >60)
Glucose, Bld: 120 mg/dL — ABNORMAL HIGH (ref 70–99)
Potassium: 4.5 mmol/L (ref 3.5–5.1)
Sodium: 137 mmol/L (ref 135–145)
Total Bilirubin: 0.7 mg/dL (ref 0.3–1.2)
Total Protein: 7 g/dL (ref 6.5–8.1)

## 2020-05-27 LAB — POC OCCULT BLOOD, ED: Fecal Occult Bld: POSITIVE — AB

## 2020-05-27 LAB — PROTIME-INR
INR: 2.1 — ABNORMAL HIGH (ref 0.8–1.2)
Prothrombin Time: 22.5 seconds — ABNORMAL HIGH (ref 11.4–15.2)

## 2020-05-27 LAB — PREPARE RBC (CROSSMATCH)

## 2020-05-27 LAB — APTT: aPTT: 44 seconds — ABNORMAL HIGH (ref 24–36)

## 2020-05-27 LAB — FERRITIN: Ferritin: 42.9 ng/mL (ref 10.0–291.0)

## 2020-05-27 LAB — SARS CORONAVIRUS 2 BY RT PCR (HOSPITAL ORDER, PERFORMED IN ~~LOC~~ HOSPITAL LAB): SARS Coronavirus 2: NEGATIVE

## 2020-05-27 MED ORDER — AMIODARONE HCL 200 MG PO TABS: 200.0000 mg | ORAL_TABLET | Freq: Every day | ORAL | Status: DC

## 2020-05-27 MED ORDER — ACETAMINOPHEN 650 MG RE SUPP: 650.0000 mg | Freq: Four times a day (QID) | RECTAL | Status: DC | PRN

## 2020-05-27 MED ORDER — LEVOTHYROXINE SODIUM 25 MCG PO TABS
137.0000 ug | ORAL_TABLET | Freq: Every day | ORAL | Status: DC
  Administered 2020-05-28 – 2020-06-03 (×6): 137 ug via ORAL
  Filled 2020-05-27 (×7): qty 1

## 2020-05-27 MED ORDER — SODIUM CHLORIDE 0.9 % IV SOLN
10.0000 mL/h | Freq: Once | INTRAVENOUS | Status: AC
  Administered 2020-05-27: 10 mL/h via INTRAVENOUS

## 2020-05-27 MED ORDER — ACETAMINOPHEN 325 MG PO TABS
650.0000 mg | ORAL_TABLET | Freq: Four times a day (QID) | ORAL | Status: DC | PRN
  Filled 2020-05-27 (×2): qty 2

## 2020-05-27 MED ORDER — FAMOTIDINE IN NACL 20-0.9 MG/50ML-% IV SOLN
20.0000 mg | Freq: Once | INTRAVENOUS | Status: AC
  Administered 2020-05-27: 20 mg via INTRAVENOUS
  Filled 2020-05-27: qty 50

## 2020-05-27 MED ORDER — CARVEDILOL 25 MG PO TABS: 25.0000 mg | ORAL_TABLET | Freq: Two times a day (BID) | ORAL | Status: DC

## 2020-05-27 NOTE — Telephone Encounter (Signed)
Patient notified of critical Hgb 5.1 and Hct 15.7.  Patient notified she needs to go to the emergency room now for probable admission.  She verbalized understanding.

## 2020-05-27 NOTE — ED Notes (Signed)
Patient transported to X-ray 

## 2020-05-27 NOTE — H&P (Addendum)
History and Physical    Annalese Stiner JSH:[id number] DOB: 12-09-1945 DOA: 05/27/2020  PCP: Patient, No Pcp Per  Patient coming from: Home.  Chief Complaint: Recent capsule endoscopy with not sure if capsule of the stomach.  HPI: Clio Gerhart is a 75 y.o. female with history of chronic anemia and GI bleed who had a recent capsule endoscopy and it was not sure if patient's Still left the stomach was advised to come to the ER.  On arrival to the ER patient complained of shortness of breath.  Patient has been having chronic melanotic stools.  Denies any abdominal pain nausea vomiting or chest pain.  Denies any fever chills.  ED Course: The ER KUB did not show any features for capsule endoscopy.  Did show some concerning features for bowel obstruction but patient denies any nausea vomiting last bowel movement was around 3 days ago.  Labs were significant for hemoglobin of 5.2 stool for occult blood was positive INR was 2.1 creatinine 2.8 patient was admitted for acute on chronic GI bleed.  Patient has had a recent COVID test positive on December 27.  Patient is asymptomatic.  Review of Systems: As per HPI, rest all negative.   Past Medical History:  Diagnosis Date  . A-fib ([place])    on Eliquis  . AICD (automatic cardioverter/defibrillator) present 10/06/2015  . Anemia   . ARF (acute renal failure) ([place])   . Arthritis    "qwhere" (01/03/2018)  . Asthma    reports mild asthma since childhood - had COPD on dx list from prior PCP  . Chronic bronchitis ([place])    "get it most years; not this past year though" (01/03/2018)  . Chronic kidney disease    "? stage;  followed by [facility]; [name]" (01/03/2018)  . Chronic systolic congestive heart failure ([place])   . COVID-19 05/03/2020  . DEPRESSION 12/17/2009   Annotation: PHQ-9 score = 14 done on 12/17/2009 Qualifier: Diagnosis of  By: Hassell Done FNP, Tori Milks    . Diabetes mellitus without complication (HCC)    DIET CONTROLLED   .  Diverticulosis   . Enteritis   . FIBROIDS, UTERUS 03/05/2008  . GI bleeding 01/03/2018  . Glaucoma   . Gout   . Hepatitis C    HEPATITIS C - s/p treatment with Harvoni, saw hepatology, Dawn Drazek  . History of blood transfusion ~ 11/2017  . Hyperlipemia 12/06/2012  . HYPERTENSION, BENIGN 04/24/2007  . Hypothyroidism   . LBBB (left bundle branch block)   . Lower GI bleeding    "been dealing w/it since 07/2017" (01/03/2018)  . Nonischemic cardiomyopathy ([place])   . OBESITY 05/27/2009  . OBSTRUCTIVE SLEEP APNEA 11/14/2007   no CPAP  . OSTEOPENIA 09/30/2008  . Pain and swelling of left upper extremity 08/08/2017  . Personal history of other infectious and parasitic disease    Hepatitis B  . Pneumonia    "several times" (01/03/2018)  . Pulmonary edema, acute ([place]) 08/09/2017    Past Surgical History:  Procedure Laterality Date  . APPENDECTOMY    . CARDIAC CATHETERIZATION    . CARDIOVERSION N/A 12/14/2014   Procedure: CARDIOVERSION;  Surgeon: Dorothy Spark, MD;  Location: [facility] ENDOSCOPY;  Service: Cardiovascular;  Laterality: N/A;  . CARDIOVERSION N/A 02/26/2017   Procedure: CARDIOVERSION;  Surgeon: Evans Lance, MD;  Location: [place] CV LAB;  Service: Cardiovascular;  Laterality: N/A;  . CARDIOVERSION N/A 07/24/2017   Procedure: CARDIOVERSION;  Surgeon: Thayer Headings, MD;  Location: [facility]  ENDOSCOPY;  Service: Cardiovascular;  Laterality: N/A;  . CARDIOVERSION N/A 02/12/2020   Procedure: CARDIOVERSION;  Surgeon: [name], MD;  Location: [facility] ENDOSCOPY;  Service: Cardiovascular;  Laterality: N/A;  . COLONOSCOPY N/A 11/08/2017   Procedure: COLONOSCOPY;  Surgeon: Milus Banister, MD;  Location: [facility] ENDOSCOPY;  Service: Endoscopy;  Laterality: N/A;  . DILATION AND CURETTAGE OF UTERUS    . ENTEROSCOPY N/A 03/02/2020   Procedure: ENTEROSCOPY;  Surgeon: Yetta Flock, MD;  Location: [facility] ENDOSCOPY;  Service: Gastroenterology;  Laterality: N/A;  . EP IMPLANTABLE DEVICE N/A 10/06/2015    Procedure: BIV ICD Generator Changeout;  Surgeon: [name], MD;  Location: [place] CV LAB;  Service: Cardiovascular;  Laterality: N/A;  . ESOPHAGOGASTRODUODENOSCOPY N/A 10/26/2017   Procedure: ESOPHAGOGASTRODUODENOSCOPY (EGD);  Surgeon: Ladene Artist, MD;  Location: [facility] ENDOSCOPY;  Service: Endoscopy;  Laterality: N/A;  . ESOPHAGOGASTRODUODENOSCOPY (EGD) WITH PROPOFOL N/A 11/07/2017   Procedure: ESOPHAGOGASTRODUODENOSCOPY (EGD) WITH PROPOFOL;  Surgeon: Milus Banister, MD;  Location: [facility] ENDOSCOPY;  Service: Endoscopy;  Laterality: N/A;  . GIVENS CAPSULE STUDY N/A 05/19/2020   Procedure: GIVENS CAPSULE STUDY;  Surgeon: [name], MD;  Location: [place];  Service: Endoscopy;  Laterality: N/A;  .adm for obs since pacemaker, PA wil enter order and see pt  . HOT HEMOSTASIS N/A 10/26/2017   Procedure: HOT HEMOSTASIS (ARGON PLASMA COAGULATION/BICAP);  Surgeon: Ladene Artist, MD;  Location: [facility] ENDOSCOPY;  Service: Endoscopy;  Laterality: N/A;  . HOT HEMOSTASIS N/A 03/02/2020   Procedure: HOT HEMOSTASIS (ARGON PLASMA COAGULATION/BICAP);  Surgeon: Yetta Flock, MD;  Location: [facility] ENDOSCOPY;  Service: Gastroenterology;  Laterality: N/A;  . INSERT / REPLACE / REMOVE PACEMAKER  ?2008  . POLYPECTOMY  11/08/2017   Procedure: POLYPECTOMY;  Surgeon: Milus Banister, MD;  Location: [facility] ENDOSCOPY;  Service: Endoscopy;;  . PROCTOSCOPY N/A 05/22/2018   Procedure: RIGID PROCTOSCOPY;  Surgeon: Michael Boston, MD;  Location: [facility] ORS;  Service: General;  Laterality: N/A;  . RIGHT HEART CATH N/A 02/13/2018   Procedure: RIGHT HEART CATH;  Surgeon: [name], MD;  Location: [place] CV LAB;  Service: Cardiovascular;  Laterality: N/A;  . RIGHT/LEFT HEART CATH AND CORONARY ANGIOGRAPHY N/A 12/11/2019   Procedure: RIGHT/LEFT HEART CATH AND CORONARY ANGIOGRAPHY;  Surgeon: [name], MD;  Location: [place] CV LAB;  Service: Cardiovascular;  Laterality: N/A;  . SUBMUCOSAL TATTOO  INJECTION  03/02/2020   Procedure: SUBMUCOSAL TATTOO INJECTION;  Surgeon: Yetta Flock, MD;  Location: [facility] ENDOSCOPY;  Service: Gastroenterology;;  . TUBAL LIGATION    . XI ROBOTIC ASSISTED LOWER ANTERIOR RESECTION N/A 05/22/2018   Procedure: XI ROBOTIC ASSISTED SIGMOID COLOECTOMY MOBILIZATION OF SPLENIC FLEXURE, FIREFLY ASSESSMENT OF PERFUSION;  Surgeon: Michael Boston, MD;  Location: [facility] ORS;  Service: General;  Laterality: N/A;  ERAS PATHWAY     reports that she has never smoked. She has never used smokeless tobacco. She reports current alcohol use. She reports current drug use. Drug: Marijuana.  No Known Allergies  Family History  Problem Relation Age of Onset  . Asthma Father   . Heart attack Father   . Asthma Sister   . Lung cancer Sister   . Heart attack Mother   . Stroke Brother   . Colon cancer Neg Hx   . Esophageal cancer Neg Hx   . Pancreatic cancer Neg Hx   . Stomach cancer Neg Hx   . Liver disease Neg Hx     Prior to Admission  medications   Medication Sig Start Date End Date Taking? Authorizing Provider  allopurinol (ZYLOPRIM) 300 MG tablet Take 300 mg by mouth daily.     [provider]  amiodarone (PACERONE) 200 MG tablet Take 1 tablet (200 mg total) by mouth daily. Patient taking differently: Take 200 mg by mouth 2 (two) times daily. 03/15/20   Fenton, Clint R, PA  apixaban (ELIQUIS) 5 MG TABS tablet Take 1 tablet (5 mg total) by mouth 2 (two) times daily. 12/12/19   [name], MD  carvedilol (COREG) 25 MG tablet Take 1 tablet by mouth twice daily 05/13/20   [name], MD  colchicine 0.6 MG tablet Take 0.6 mg by mouth daily. 02/06/18   [provider]  ferrous sulfate 325 (65 FE) MG tablet Take 1 tablet (325 mg total) by mouth daily. 03/04/20 03/04/21  [place] Donnamarie Poag, DO  gabapentin (NEURONTIN) 300 MG capsule Take 1 capsule (300 mg total) by mouth 2 (two) times daily. 07/17/19   Billie Ruddy, MD  isosorbide-hydrALAZINE (BIDIL)  20-37.5 MG tablet Take 1 tablet by mouth 3 (three) times daily. 12/05/19   [name], MD  levothyroxine (SYNTHROID) 137 MCG tablet Take 1 tablet (137 mcg total) by mouth daily before breakfast. 03/11/20   Billie Ruddy, MD  nitroGLYCERIN (NITROSTAT) 0.4 MG SL tablet Place 0.4 mg under the tongue every 5 (five) minutes x 3 doses as needed for chest pain.     [provider]  pantoprazole (PROTONIX) 40 MG tablet Take 1 tablet (40 mg total) by mouth daily. 04/06/20 04/06/21  Clegg, [name] D, NP  spironolactone (ALDACTONE) 25 MG tablet Take 0.5 tablets (12.5 mg total) by mouth daily. 12/05/19 03/23/22  [name], MD  tiZANidine (ZANAFLEX) 2 MG tablet Take 1 tablet (2 mg total) by mouth at bedtime as needed for muscle spasms. 04/07/20   Billie Ruddy, MD  torsemide (DEMADEX) 20 MG tablet Take 2 tablets (40 mg total) by mouth daily. 05/24/20 08/22/20  [name], MD  traZODone (DESYREL) 50 MG tablet Take 50 mg by mouth at bedtime.    [provider]    Physical Exam: Constitutional: Moderately built and nourished. Vitals:   05/27/20 1830 05/27/20 1900 05/27/20 2018 05/27/20 2042  BP: (!) 115/50 (!) 119/53 (!) 129/56 (!) 128/48  Pulse: 77 69 65 65  Resp: (!) 22 19 14 14   Temp:   98.8 F (37.1 C) 98.5 F (36.9 C)  TempSrc:   Oral Oral  SpO2: 100% 97% 100% 100%  Weight:      Height:       Eyes: Anicteric no pallor. ENMT: No discharge from the ears eyes nose or mouth. Neck: No mass felt.  No neck rigidity. Respiratory: No rhonchi or crepitations. Cardiovascular: S1-S2 heard. Abdomen: Soft nontender bowel sounds present. Musculoskeletal: No edema. Skin: No rash. Neurologic: Alert awake oriented to time place and person.  Moves all extremities. Psychiatric: Appears normal.  Normal affect.   Labs on Admission: I have personally reviewed following labs and imaging studies  CBC: Recent Labs  Lab 05/27/20 1038 05/27/20 1357  WBC 6.4 7.1  HGB 5.1 cL*  5.2*  HCT 15.7 cL* 16.8*  MCV 90.6 93.9  PLT 191.0 546   Basic Metabolic Panel: Recent Labs  Lab 05/27/20 1038 05/27/20 1357  NA 138 137  K 4.5 4.5  CL 106 104  CO2 23 20*  GLUCOSE 105* 120*  BUN 67* 66*  CREATININE 2.78* 2.89*  CALCIUM 9.0 9.0   GFR: Estimated Creatinine Clearance: 14.3 mL/min (A) (by C-G formula based on SCr of 2.89 mg/dL (H)). Liver Function Tests: Recent Labs  Lab 05/27/20 1357  AST 18  ALT 14  ALKPHOS 75  BILITOT 0.7  PROT 7.0  ALBUMIN 3.6   No results for input(s): LIPASE, AMYLASE in the last 168 hours. No results for input(s): AMMONIA in the last 168 hours. Coagulation Profile: Recent Labs  Lab 05/27/20 1843  INR 2.1*   Cardiac Enzymes: No results for input(s): CKTOTAL, CKMB, CKMBINDEX, TROPONINI in the last 168 hours. BNP (last 3 results) Recent Labs    07/17/19 1352 07/23/19 1033  PROBNP 811.0* 637.0*   HbA1C: No results for input(s): HGBA1C in the last 72 hours. CBG: No results for input(s): GLUCAP in the last 168 hours. Lipid Profile: No results for input(s): CHOL, HDL, LDLCALC, TRIG, CHOLHDL, LDLDIRECT in the last 72 hours. Thyroid Function Tests: No results for input(s): TSH, T4TOTAL, FREET4, T3FREE, THYROIDAB in the last 72 hours. Anemia Panel: Recent Labs    05/27/20 1038  FERRITIN 42.9   Urine analysis:    Component Value Date/Time   COLORURINE YELLOW 03/05/2018 0902   APPEARANCEUR CLEAR 03/05/2018 0902   LABSPEC 1.008 03/05/2018 0902   PHURINE 5.0 03/05/2018 0902   GLUCOSEU NEGATIVE 03/05/2018 0902   HGBUR NEGATIVE 03/05/2018 0902   HGBUR negative 02/28/2008 0000   BILIRUBINUR NEGATIVE 03/05/2018 0902   KETONESUR 5 (A) 03/05/2018 0902   PROTEINUR NEGATIVE 03/05/2018 0902   UROBILINOGEN 1.0 07/19/2010 1752   NITRITE NEGATIVE 03/05/2018 0902   LEUKOCYTESUR NEGATIVE 03/05/2018 0902   Sepsis Labs: @LABRCNTIP (procalcitonin:4,lacticidven:4) )No results found for this or any previous visit (from the past 240  hour(s)).   Radiological Exams on Admission: DG Ribs Unilateral W/Chest Left  Result Date: 05/27/2020 CLINICAL DATA:  Fall, left rib pain EXAM: LEFT RIBS AND CHEST - 3+ VIEW COMPARISON:  Radiograph 03/01/2020 FINDINGS: Pacer/defibrillator battery pack overlies the left chest wall. No displaced or other visible rib fractures are identified. No acute traumatic abnormalities of the chest wall are clearly evident on radiography. There is some increasing interstitial opacities in a perihilar and basilar predominance with mild vascular cuffing and fissural and septal thickening suggestive of at least mild mild interstitial pulmonary edema. Pulmonary vascular congestion and mild cardiomegaly are similar to priors. No pneumothorax or effusion. No acute osseous or soft tissue abnormality. Degenerative changes are present in the imaged spine and shoulders. IMPRESSION: 1. No visible displaced rib fractures or other acute traumatic abnormality of the chest wall 2. Features suggestive of CHF with pulmonary edema, cardiomegaly and vascular congestion. 3. No pneumothorax or effusion. Electronically Signed   By: Lovena Le M.D.   On: 05/27/2020 20:22   DG Abdomen 1 View  Result Date: 05/27/2020 CLINICAL DATA:  Capsule endoscopy 8 days prior, concern for retention EXAM: ABDOMEN - 1 VIEW COMPARISON:  Radiograph 05/27/2020, CT 06/19/2018 FINDINGS: Endoscopic capsule is not visualized over the abdomen or pelvis. Redemonstration of surgical clips in the low abdomen as well as heterogeneous calcification the pelvis compatible with calcified uterine fibroids. Few clustered air-filled loops of bowel in the left hemiabdomen are not significantly distended. No convincing residual high-grade obstructive pattern is seen. No acute osseous or other soft tissue abnormality. Degenerative changes in the spine, hips and pelvis. Some atelectatic changes in the bases. Terminus of a pacer/defibrillator device noted over the lower  cardiomediastinal silhouette. IMPRESSION: 1. Endoscopic capsule is not visualized over the abdomen or pelvis.  2. Few clustered air-filled loops of bowel in the left hemiabdomen are not significantly distended, a nonspecific finding on likely reflect high-grade obstruction though should correlate for abdominal symptoms. Electronically Signed   By: Lovena Le M.D.   On: 05/27/2020 20:02   DG Abd 1 View  Result Date: 05/27/2020 CLINICAL DATA:  Evaluate for retained capsule.  Asymptomatic. EXAM: ABDOMEN - 1 VIEW COMPARISON:  08/11/2017 FINDINGS: Single supine view of the abdomen and pelvis. Surgical clips in the upper pelvis. No gaseous distention of bowel loops. Calcified uterine fibroid is eccentric left. No evidence of metallic retained capsule. IMPRESSION: No evidence of retained capsule or bowel obstruction. Electronically Signed   By: [name] M.D.   On: 05/27/2020 16:06    EKG: Independently reviewed.  Paced rhythm  Assessment/Plan Principal Problem:   Acute GI bleeding Active Problems:   Hypothyroidism   OBSTRUCTIVE SLEEP APNEA   Chronic diastolic CHF (congestive heart failure) (HCC)   Nonischemic cardiomyopathy (HCC)   Type 2 diabetes, controlled, with renal manifestation (HCC)   CKD (chronic kidney disease) stage 4, GFR 15-29 ml/min    Atrial fibrillation, chronic   AVM (arteriovenous malformation) of small bowel, acquired   Anemia   Acute blood loss anemia   COVID-19 virus infection    1. Acute on chronic GI bleed -patient is being ordered 2 units of PRBC transfusion we will keep patient on Protonix keep n.p.o. consult [facility] follow CBC.  Empiric antibiotics with history of cirrhosis.  Hold Eliquis for now. 2. Abnormal KUB with no signs of any capsule endoscopy in that.  X-ray shows features concerning for small bowel obstruction but patient does not have any signs clinically.  We will check a CT abdomen pelvis. 3. History of nonischemic cardiomyopathy status post  pacemaker placement presently on spironolactone torsemide BiDil. 4. History of A. fib on Coreg and amiodarone.  Eliquis on hold due to GI bleed. 5. Acute blood loss anemia follow CBC. 6. Hypothyroidism on Synthroid. 7. Chronic and disease stage IV creatinine appears to be at baseline. 8. Recent COVID-19 infection.  Since patient has GI bleed in the setting of anticoagulation and will need inpatient status.   DVT prophylaxis: SCDs.  Avoiding anticoagulation due to GI bleed. Code Status: Full code. Family Communication: Discussed with patient. Disposition Plan: Home. Consults called: [facility] sent a message. Admission status: Inpatient.   Rise Patience MD [facility] Pager [phone].  If 7PM-7AM, please contact night-coverage [email or web address] Password [facility]  05/27/2020, 9:45 PM

## 2020-05-27 NOTE — ED Triage Notes (Signed)
Pt reports she is here today due to hemoglobin 5.0 Pt reports she is having sob, N&V. Pt reports she has been feeling weak and vomiting and having frequent falls.Pt denies hitting her head on falls.

## 2020-05-27 NOTE — Telephone Encounter (Signed)
Patient notified of the results and recommendations She will come for KUB this am Patient reports multiple falls recently  "my legs get weak".  She denies being orthostatic.  She reports stools are "jet black"  And she has not noticed the capsule in her stool Discussed recent falls with [name] she will come in and have a CBC, BMET,  and ferritin as well.

## 2020-05-27 NOTE — Telephone Encounter (Signed)
Pt is requesting a call back from a nurse, pt is wanting to inform the nurse what to do after doing the labs and xray.

## 2020-05-27 NOTE — Telephone Encounter (Signed)
Returned call, no answer and no VM

## 2020-05-27 NOTE — ED Provider Notes (Signed)
[place] EMERGENCY DEPARTMENT Provider Note   CSN: [id number] Arrival date & time: 05/27/20  1248     History Chief Complaint  Patient presents with  . Weakness    Morgan Clay is a 75 y.o. female.  HPI Morgan Clay is a 75 y.o. female.  PMH compensated cirrhosis of the liver, trace esophageal varices. Hep C eradicated with Harvoni. Chronic Eliquis for A. Fib. Cardioversion 02/12/2020. Non-ischemic CM,CHF, s/p pacemaker,  AICD. Followed in Heart failure clinic.  CKD, stage 4/GFR mid 20s at baseline. 2020 sigmoid colectomy to address diverticulitis. OSA.   Tested + for Covid 19 on 05/03/2020.  Patient has had frequent episodes of GI bleeding and multiple work-ups in the past.  Has required blood transfusions in the past as well.  She is followed by [facility].  She states that over the past week she has had significant weakness as had multiple small falls states that she fell onto her right rib cage earlier today in the bathtub however she states she did not hit her head or lose consciousness have any nausea or vomiting denies any headache neck pain or back pain.  She states that when she falls is because her legs feel weak.  She states that it is symmetric affecting both legs.  She describes her stool as dry plaque.  States that she is taking iron supplements however her stool looked significantly different and more tarry today.  She had lab work done yesterday and was found to have a hemoglobin of 5.1 is sent to ER for further evaluation.  Patient was seen in 03/02/2020 and had trace esophageal varices without evidence of bleeding.  She has had AVMs on upper endoscopy in the past.  These were ablated with APC.  Capsule endoscopy initiated 05/19/2020.  Patient states that she has not passed the camera.  I reviewed patient's most recent echo 12/29/2019 patient had a EF of 55%.  She is still taking her Eliquis as prescribed last dose was yesterday    Past  Medical History:  Diagnosis Date  . A-fib ([place])    on Eliquis  . AICD (automatic cardioverter/defibrillator) present 10/06/2015  . Anemia   . ARF (acute renal failure) ([place])   . Arthritis    "qwhere" (01/03/2018)  . Asthma    reports mild asthma since childhood - had COPD on dx list from prior PCP  . Chronic bronchitis ([place])    "get it most years; not this past year though" (01/03/2018)  . Chronic kidney disease    "? stage;  followed by [facility]; [name]" (01/03/2018)  . Chronic systolic congestive heart failure ([place])   . COVID-19 05/03/2020  . DEPRESSION 12/17/2009   Annotation: PHQ-9 score = 14 done on 12/17/2009 Qualifier: Diagnosis of  By: Hassell Done FNP, Tori Milks    . Diabetes mellitus without complication (HCC)    DIET CONTROLLED   . Diverticulosis   . Enteritis   . FIBROIDS, UTERUS 03/05/2008  . GI bleeding 01/03/2018  . Glaucoma   . Gout   . Hepatitis C    HEPATITIS C - s/p treatment with Harvoni, saw hepatology, Dawn Drazek  . History of blood transfusion ~ 11/2017  . Hyperlipemia 12/06/2012  . HYPERTENSION, BENIGN 04/24/2007  . Hypothyroidism   . LBBB (left bundle branch block)   . Lower GI bleeding    "been dealing w/it since 07/2017" (01/03/2018)  . Nonischemic cardiomyopathy ([place])   . OBESITY 05/27/2009  . OBSTRUCTIVE SLEEP APNEA 11/14/2007  no CPAP  . OSTEOPENIA 09/30/2008  . Pain and swelling of left upper extremity 08/08/2017  . Personal history of other infectious and parasitic disease    Hepatitis B  . Pneumonia    "several times" (01/03/2018)  . Pulmonary edema, acute ([place]) 08/09/2017    Patient Active Problem List   Diagnosis Date Noted  . Acute GI bleeding 05/27/2020  . Acute blood loss anemia 05/27/2020  . COVID-19 virus infection 05/27/2020  . Anemia 05/19/2020  . Anemia due to chronic blood loss 04/15/2020  . Overweight (BMI 25.0-29.9) 03/02/2020  . Long term current use of anticoagulant   . GI bleed 03/01/2020  . Persistent atrial  fibrillation ([place]) 02/06/2020  . Secondary hypercoagulable state ([place]) 02/06/2020  . CHF (congestive heart failure) ([place]) 07/23/2019  . 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 (HCV) ([place])   . Gout   . Anemia in chronic kidney disease 01/18/2018  . Pulmonary hypertension ([place]) on echocardiogram 01/14/2018  . AVM (arteriovenous malformation) of small bowel, acquired 01/14/2018  . Atrial fibrillation, chronic 10/25/2017  . Aortic atherosclerosis ([place]) 08/30/2017  . Diverticulitis of left colon status post robotic low anterior to sigmoid resection 05/22/2018 07/31/2017  . Hypertension associated with diabetes ([place]) 06/07/2017  . MDD (major depressive disorder), recurrent, in partial remission ([place]) 06/07/2017  . Asthma 05/22/2017  . Chronic obstructive pulmonary disease ([place])   . Atypical atrial flutter ([place]) 09/28/2015  . Type 2 diabetes, controlled, with renal manifestation ([place]) 11/24/2013  . CKD (chronic kidney disease) stage 4, GFR 15-29 ml/min  11/24/2013  . Nonischemic cardiomyopathy ([place]) 11/22/2010  . Obesity 05/27/2009  . OSTEOPENIA 09/30/2008  . FIBROIDS, UTERUS 03/05/2008  . OBSTRUCTIVE SLEEP APNEA 11/14/2007  . Chronic diastolic CHF (congestive heart failure) ([place]) 04/24/2007  . Hypothyroidism 01/28/2007  . Biventricular ICD (implantable cardioverter-defibrillator) in place 01/08/2007    Past Surgical History:  Procedure Laterality Date  . APPENDECTOMY    . CARDIAC CATHETERIZATION    . CARDIOVERSION N/A 12/14/2014   Procedure: CARDIOVERSION;  Surgeon: Dorothy Spark, MD;  Location: [facility] ENDOSCOPY;  Service: Cardiovascular;  Laterality: N/A;  . CARDIOVERSION N/A 02/26/2017   Procedure: CARDIOVERSION;  Surgeon: Evans Lance, MD;  Location: [facility] CV LAB;  Service: Cardiovascular;  Laterality: N/A;  . CARDIOVERSION N/A 07/24/2017   Procedure: CARDIOVERSION;  Surgeon: Thayer Headings, MD;  Location: [facility] ENDOSCOPY;  Service: Cardiovascular;  Laterality: N/A;   . CARDIOVERSION N/A 02/12/2020   Procedure: CARDIOVERSION;  Surgeon: [name], MD;  Location: [facility] ENDOSCOPY;  Service: Cardiovascular;  Laterality: N/A;  . COLONOSCOPY N/A 11/08/2017   Procedure: COLONOSCOPY;  Surgeon: Milus Banister, MD;  Location: [facility] ENDOSCOPY;  Service: Endoscopy;  Laterality: N/A;  . DILATION AND CURETTAGE OF UTERUS    . ENTEROSCOPY N/A 03/02/2020   Procedure: ENTEROSCOPY;  Surgeon: Yetta Flock, MD;  Location: [facility] ENDOSCOPY;  Service: Gastroenterology;  Laterality: N/A;  . EP IMPLANTABLE DEVICE N/A 10/06/2015   Procedure: BIV ICD Generator Changeout;  Surgeon: [name], MD;  Location: [place] CV LAB;  Service: Cardiovascular;  Laterality: N/A;  . ESOPHAGOGASTRODUODENOSCOPY N/A 10/26/2017   Procedure: ESOPHAGOGASTRODUODENOSCOPY (EGD);  Surgeon: Ladene Artist, MD;  Location: [facility] ENDOSCOPY;  Service: Endoscopy;  Laterality: N/A;  . ESOPHAGOGASTRODUODENOSCOPY (EGD) WITH PROPOFOL N/A 11/07/2017   Procedure: ESOPHAGOGASTRODUODENOSCOPY (EGD) WITH PROPOFOL;  Surgeon: Milus Banister, MD;  Location: [facility] ENDOSCOPY;  Service: Endoscopy;  Laterality: N/A;  . GIVENS CAPSULE STUDY N/A 05/19/2020   Procedure: GIVENS CAPSULE STUDY;  Surgeon: [name], MD;  Location: [facility] ENDOSCOPY;  Service: Endoscopy;  Laterality: N/A;  .adm for obs since pacemaker, PA wil enter order and see pt  . HOT HEMOSTASIS N/A 10/26/2017   Procedure: HOT HEMOSTASIS (ARGON PLASMA COAGULATION/BICAP);  Surgeon: Ladene Artist, MD;  Location: [facility] ENDOSCOPY;  Service: Endoscopy;  Laterality: N/A;  . HOT HEMOSTASIS N/A 03/02/2020   Procedure: HOT HEMOSTASIS (ARGON PLASMA COAGULATION/BICAP);  Surgeon: Yetta Flock, MD;  Location: [facility] ENDOSCOPY;  Service: Gastroenterology;  Laterality: N/A;  . INSERT / REPLACE / REMOVE PACEMAKER  ?2008  . POLYPECTOMY  11/08/2017   Procedure: POLYPECTOMY;  Surgeon: Milus Banister, MD;  Location: [facility] ENDOSCOPY;  Service: Endoscopy;;  . PROCTOSCOPY N/A  05/22/2018   Procedure: RIGID PROCTOSCOPY;  Surgeon: Michael Boston, MD;  Location: [facility] ORS;  Service: General;  Laterality: N/A;  . RIGHT HEART CATH N/A 02/13/2018   Procedure: RIGHT HEART CATH;  Surgeon: [name], MD;  Location: [facility] CV LAB;  Service: Cardiovascular;  Laterality: N/A;  . RIGHT/LEFT HEART CATH AND CORONARY ANGIOGRAPHY N/A 12/11/2019   Procedure: RIGHT/LEFT HEART CATH AND CORONARY ANGIOGRAPHY;  Surgeon: [name], MD;  Location: [place] CV LAB;  Service: Cardiovascular;  Laterality: N/A;  . SUBMUCOSAL TATTOO INJECTION  03/02/2020   Procedure: SUBMUCOSAL TATTOO INJECTION;  Surgeon: Yetta Flock, MD;  Location: [facility] ENDOSCOPY;  Service: Gastroenterology;;  . TUBAL LIGATION    . XI ROBOTIC ASSISTED LOWER ANTERIOR RESECTION N/A 05/22/2018   Procedure: XI ROBOTIC ASSISTED SIGMOID COLOECTOMY MOBILIZATION OF SPLENIC FLEXURE, FIREFLY ASSESSMENT OF PERFUSION;  Surgeon: Michael Boston, MD;  Location: [facility] ORS;  Service: General;  Laterality: N/A;  ERAS PATHWAY     OB History   No obstetric history on file.     Family History  Problem Relation Age of Onset  . Asthma Father   . Heart attack Father   . Asthma Sister   . Lung cancer Sister   . Heart attack Mother   . Stroke Brother   . Colon cancer Neg Hx   . Esophageal cancer Neg Hx   . Pancreatic cancer Neg Hx   . Stomach cancer Neg Hx   . Liver disease Neg Hx     Social History   Tobacco Use  . Smoking status: Never Smoker  . Smokeless tobacco: Never Used  Vaping Use  . Vaping Use: Never used  Substance Use Topics  . Alcohol use: Yes    Comment: 01/03/2018 "couple drinks/month"  . Drug use: Yes    Types: Marijuana    Comment: VERY INTERMITTENT     Home Medications Prior to Admission medications   Medication Sig Start Date End Date Taking? Authorizing Provider  allopurinol (ZYLOPRIM) 300 MG tablet Take 300 mg by mouth daily.    Yes [provider]  amiodarone (PACERONE) 200 MG  tablet Take 1 tablet (200 mg total) by mouth daily. Patient taking differently: Take 200 mg by mouth 2 (two) times daily. 03/15/20  Yes Fenton, Clint R, PA  apixaban (ELIQUIS) 5 MG TABS tablet Take 1 tablet (5 mg total) by mouth 2 (two) times daily. 12/12/19  Yes [name], MD  carvedilol (COREG) 25 MG tablet Take 1 tablet by mouth twice daily 05/13/20  Yes [name], MD  colchicine 0.6 MG tablet Take 0.6 mg by mouth daily. 02/06/18  Yes [provider]  ferrous sulfate 325 (65 FE) MG tablet Take 1 tablet (325 mg total) by mouth daily.  03/04/20 03/04/21 Yes [place] Eric J, DO  gabapentin (NEURONTIN) 300 MG capsule Take 1 capsule (300 mg total) by mouth 2 (two) times daily. 07/17/19  Yes Billie Ruddy, MD  isosorbide-hydrALAZINE (BIDIL) 20-37.5 MG tablet Take 1 tablet by mouth 3 (three) times daily. 12/05/19  Yes [name], MD  levothyroxine (SYNTHROID) 137 MCG tablet Take 1 tablet (137 mcg total) by mouth daily before breakfast. 03/11/20  Yes Billie Ruddy, MD  nitroGLYCERIN (NITROSTAT) 0.4 MG SL tablet Place 0.4 mg under the tongue every 5 (five) minutes x 3 doses as needed for chest pain.    Yes [provider]  pantoprazole (PROTONIX) 40 MG tablet Take 1 tablet (40 mg total) by mouth daily. 04/06/20 04/06/21 Yes Clegg, [name] D, NP  spironolactone (ALDACTONE) 25 MG tablet Take 0.5 tablets (12.5 mg total) by mouth daily. 12/05/19 03/23/22 Yes [name], MD  tiZANidine (ZANAFLEX) 2 MG tablet Take 1 tablet (2 mg total) by mouth at bedtime as needed for muscle spasms. 04/07/20  Yes Billie Ruddy, MD  torsemide (DEMADEX) 20 MG tablet Take 2 tablets (40 mg total) by mouth daily. 05/24/20 08/22/20 Yes [name], MD  traZODone (DESYREL) 50 MG tablet Take 50 mg by mouth at bedtime.   Yes [provider]    Allergies    Patient has no known allergies.  Review of Systems   Review of Systems  Constitutional: Positive for fatigue. Negative for  chills and fever.  HENT: Negative for congestion.   Eyes: Negative for pain.  Respiratory: Negative for cough and shortness of breath.   Cardiovascular: Negative for chest pain and leg swelling.  Gastrointestinal: Positive for anal bleeding (Occasional red/burgundy color but more often jet black). Negative for abdominal pain and vomiting.  Genitourinary: Negative for dysuria and vaginal discharge.  Musculoskeletal: Negative for myalgias.       Left rib cage pain  Skin: Negative for rash.  Neurological: Positive for weakness. Negative for dizziness and headaches.    Physical Exam Updated Vital Signs BP (!) 151/50 (BP Location: Left Arm)   Pulse 60   Temp (!) 97.5 F (36.4 C) (Oral)   Resp 17   Ht 4\' 11"  (1.499 m)   Wt 63.5 kg   SpO2 100%   BMI 28.28 kg/m   Physical Exam Vitals and nursing note reviewed.  Constitutional:      General: She is not in acute distress.    Appearance: She is obese.     Comments: Fatigued 75 year old female does not appear to be in any acute distress.  HENT:     Head: Normocephalic and atraumatic.     Nose: Nose normal.  Eyes:     General: No scleral icterus. Cardiovascular:     Rate and Rhythm: Normal rate and regular rhythm.     Pulses: Normal pulses.     Heart sounds: Normal heart sounds.  Pulmonary:     Effort: Pulmonary effort is normal. No respiratory distress.     Breath sounds: Normal breath sounds. No wheezing.     Comments: Lungs clear to auscultation all fields Abdominal:     Palpations: Abdomen is soft.     Tenderness: There is no abdominal tenderness. There is no guarding or rebound.  Genitourinary:    Comments: Black stool. Stool present in the rectal vault.  No hemorrhoids. Musculoskeletal:     Cervical back: Normal range of motion.     Right lower leg: No edema.  Left lower leg: No edema.     Comments: Diffuse left chest tenderness to palpation primarily the left lateral thorax but extends to the anterior chest no pain  without palpation.  Skin:    General: Skin is warm and dry.     Capillary Refill: Capillary refill takes less than 2 seconds.  Neurological:     Mental Status: She is alert. Mental status is at baseline.  Psychiatric:        Mood and Affect: Mood normal.        Behavior: Behavior normal.     ED Results / Procedures / Treatments   Labs (all labs ordered are listed, but only abnormal results are displayed) Labs Reviewed  CBC - Abnormal; Notable for the following components:      Result Value   RBC 1.79 (*)    Hemoglobin 5.2 (*)    HCT 16.8 (*)    RDW 20.9 (*)    All other components within normal limits  COMPREHENSIVE METABOLIC PANEL - Abnormal; Notable for the following components:   CO2 20 (*)    Glucose, Bld 120 (*)    BUN 66 (*)    Creatinine, Ser 2.89 (*)    GFR, Estimated 17 (*)    All other components within normal limits  PROTIME-INR - Abnormal; Notable for the following components:   Prothrombin Time 22.5 (*)    INR 2.1 (*)    All other components within normal limits  APTT - Abnormal; Notable for the following components:   aPTT 44 (*)    All other components within normal limits  CBC - Abnormal; Notable for the following components:   RBC 2.21 (*)    Hemoglobin 6.8 (*)    HCT 20.3 (*)    RDW 18.5 (*)    All other components within normal limits  BASIC METABOLIC PANEL - Abnormal; Notable for the following components:   CO2 20 (*)    BUN 65 (*)    Creatinine, Ser 2.90 (*)    Calcium 8.7 (*)    GFR, Estimated 17 (*)    All other components within normal limits  HEMOGLOBIN AND HEMATOCRIT, BLOOD - Abnormal; Notable for the following components:   Hemoglobin 6.6 (*)    HCT 20.7 (*)    All other components within normal limits  CBC - Abnormal; Notable for the following components:   RBC 2.72 (*)    Hemoglobin 8.2 (*)    HCT 24.6 (*)    RDW 17.9 (*)    All other components within normal limits  GLUCOSE, CAPILLARY - Abnormal; Notable for the following  components:   Glucose-Capillary 134 (*)    All other components within normal limits  GLUCOSE, CAPILLARY - Abnormal; Notable for the following components:   Glucose-Capillary 126 (*)    All other components within normal limits  BASIC METABOLIC PANEL - Abnormal; Notable for the following components:   Sodium 134 (*)    CO2 20 (*)    Glucose, Bld 107 (*)    BUN 63 (*)    Creatinine, Ser 2.79 (*)    Calcium 8.7 (*)    GFR, Estimated 17 (*)    All other components within normal limits  POC OCCULT BLOOD, ED - Abnormal; Notable for the following components:   Fecal Occult Bld POSITIVE (*)    All other components within normal limits  SARS CORONAVIRUS 2 BY RT PCR (HOSPITAL ORDER, [place] [facility])  GLUCOSE, CAPILLARY  GLUCOSE, CAPILLARY  GLUCOSE, CAPILLARY  MAGNESIUM  TYPE AND SCREEN  PREPARE RBC (CROSSMATCH)  PREPARE RBC (CROSSMATCH)    EKG EKG Interpretation  Date/Time:  Thursday May 27 2020 20:25:09 EST Ventricular Rate:  65 PR Interval:    QRS Duration: 146 QT Interval:  512 QTC Calculation: 533 R Axis:   -85 Text Interpretation: Atrial-sensed ventricular-paced rhythm Biventricular pacemaker detected No significant change since last tracing Confirmed by Calvert Cantor [phone]) on 05/27/2020 9:20:23 PM Also confirmed by Calvert Cantor [phone]), editor Hattie Perch [phone])  on 05/28/2020 10:41:40 AM   Radiology CT ABDOMEN PELVIS WO CONTRAST  Result Date: 05/28/2020 CLINICAL DATA:  Abdominal distention. Recent capsule endoscopy on 05/19/2020. EXAM: CT ABDOMEN AND PELVIS WITHOUT CONTRAST TECHNIQUE: Multidetector CT imaging of the abdomen and pelvis was performed following the standard protocol without IV contrast. COMPARISON:  06/19/2018 FINDINGS: Lower chest: Heart is enlarged without substantial pericardial effusion where visualized. Small right pleural effusion is similar to prior. Hepatobiliary: Nodular liver contour (see inferior right liver  on 34/3) suggests cirrhosis. Subtle high attenuation material layering in the gallbladder lumen may be related to sludge or tiny stones. No intrahepatic or extrahepatic biliary dilation. Pancreas: No focal mass lesion. No dilatation of the main duct. No intraparenchymal cyst. No peripancreatic edema. Spleen: No splenomegaly. No focal mass lesion. Adrenals/Urinary Tract: No adrenal nodule or mass. Unremarkable noncontrast appearance of the kidneys. No evidence for hydroureter. The urinary bladder appears normal for the degree of distention. Stomach/Bowel: Stomach is moderately distended with contrast material. Duodenum is normally positioned as is the ligament of Treitz. No small bowel wall thickening. No small bowel dilatation. The terminal ileum is normal. The appendix is not visualized, but there is no edema or inflammation in the region of the cecum. Status post left hemicolectomy. Diverticuli are seen scattered along the entire length of the colon without CT findings of diverticulitis. Oral contrast material for today's study has migrated through small bowel and colon to the level of the rectum. No retained capsule endoscope in the abdomen or pelvis. Vascular/Lymphatic: There is abdominal aortic atherosclerosis without aneurysm. There is no gastrohepatic or hepatoduodenal ligament lymphadenopathy. No retroperitoneal or mesenteric lymphadenopathy. No pelvic sidewall lymphadenopathy. Reproductive: Stable appearance large calcified uterine fibroid. There is no adnexal mass. Other: No intraperitoneal free fluid. Musculoskeletal: Small left groin hernia contains fluid, stable since prior. No worrisome lytic or sclerotic osseous abnormality. IMPRESSION: 1. No acute findings in the abdomen or pelvis. Specifically, no findings to explain the patient's history of abdominal distention. Oral contrast material has migrated through small bowel and colon to the level of the rectum. No retained capsule endoscope in the abdomen  or pelvis. 2. Nodular liver contour suggests cirrhosis. 3. Subtle high attenuation material layering in the gallbladder lumen may be related to sludge or tiny stones. 4. Stable small right pleural effusion. 5. Aortic Atherosclerosis (ICD10-I70.0). Electronically Signed   By: Misty Stanley M.D.   On: 05/28/2020 09:44   DG Ribs Unilateral W/Chest Left  Result Date: 05/27/2020 CLINICAL DATA:  Fall, left rib pain EXAM: LEFT RIBS AND CHEST - 3+ VIEW COMPARISON:  Radiograph 03/01/2020 FINDINGS: Pacer/defibrillator battery pack overlies the left chest wall. No displaced or other visible rib fractures are identified. No acute traumatic abnormalities of the chest wall are clearly evident on radiography. There is some increasing interstitial opacities in a perihilar and basilar predominance with mild vascular cuffing and fissural and septal thickening suggestive of at least mild mild interstitial pulmonary edema. Pulmonary vascular congestion and  mild cardiomegaly are similar to priors. No pneumothorax or effusion. No acute osseous or soft tissue abnormality. Degenerative changes are present in the imaged spine and shoulders. IMPRESSION: 1. No visible displaced rib fractures or other acute traumatic abnormality of the chest wall 2. Features suggestive of CHF with pulmonary edema, cardiomegaly and vascular congestion. 3. No pneumothorax or effusion. Electronically Signed   By: Lovena Le M.D.   On: 05/27/2020 20:22   DG Abdomen 1 View  Result Date: 05/27/2020 CLINICAL DATA:  Capsule endoscopy 8 days prior, concern for retention EXAM: ABDOMEN - 1 VIEW COMPARISON:  Radiograph 05/27/2020, CT 06/19/2018 FINDINGS: Endoscopic capsule is not visualized over the abdomen or pelvis. Redemonstration of surgical clips in the low abdomen as well as heterogeneous calcification the pelvis compatible with calcified uterine fibroids. Few clustered air-filled loops of bowel in the left hemiabdomen are not significantly distended. No  convincing residual high-grade obstructive pattern is seen. No acute osseous or other soft tissue abnormality. Degenerative changes in the spine, hips and pelvis. Some atelectatic changes in the bases. Terminus of a pacer/defibrillator device noted over the lower cardiomediastinal silhouette. IMPRESSION: 1. Endoscopic capsule is not visualized over the abdomen or pelvis. 2. Few clustered air-filled loops of bowel in the left hemiabdomen are not significantly distended, a nonspecific finding on likely reflect high-grade obstruction though should correlate for abdominal symptoms. Electronically Signed   By: Lovena Le M.D.   On: 05/27/2020 20:02    Procedures .Critical Care Performed by: Tedd Sias, PA Authorized by: Tedd Sias, PA   Critical care provider statement:    Critical care time (minutes):  35   Critical care time was exclusive of:  Separately billable procedures and treating other patients and teaching time   Critical care was necessary to treat or prevent imminent or life-threatening deterioration of the following conditions: Severe anemia.   Critical care was time spent personally by me on the following activities:  Discussions with consultants, evaluation of patient's response to treatment, examination of patient, review of old charts, re-evaluation of patient's condition, pulse oximetry, ordering and review of radiographic studies, ordering and review of laboratory studies and ordering and performing treatments and interventions   I assumed direction of critical care for this patient from another provider in my specialty: no     (including critical care time)  Medications Ordered in ED Medications  levothyroxine (SYNTHROID) tablet 137 mcg ( Oral MAR Unhold 05/29/20 1103)  acetaminophen (TYLENOL) tablet 650 mg ( Oral MAR Unhold 05/29/20 1103)    Or  acetaminophen (TYLENOL) suppository 650 mg ( Rectal MAR Unhold 05/29/20 1103)  amiodarone (PACERONE) tablet 100 mg (100 mg  Oral Given 05/29/20 1231)  carvedilol (COREG) tablet 3.125 mg (3.125 mg Oral Given 05/29/20 1230)  pantoprazole (PROTONIX) EC tablet 40 mg (40 mg Oral Given 05/29/20 1230)  oxyCODONE (Oxy IR/ROXICODONE) immediate release tablet 5 mg (5 mg Oral Given 05/29/20 1226)  HYDROmorphone (DILAUDID) injection 0.5 mg (has no administration in time range)  famotidine (PEPCID) IVPB 20 mg premix (0 mg Intravenous Stopped 05/27/20 2237)  0.9 %  sodium chloride infusion (10 mL/hr Intravenous New Bag/Given 05/27/20 2020)  iohexol (OMNIPAQUE) 9 MG/ML oral solution (  Contrast Given 05/28/20 0626)  0.9 %  sodium chloride infusion (Manually program via Guardrails IV Fluids) ( Intravenous New Bag/Given 05/28/20 1713)    ED Course  I have reviewed the triage vital signs and the nursing notes.  Pertinent labs & imaging results that were available during my  care of the patient were reviewed by me and considered in my medical decision making (see chart for details).  Patient 75 year old female presented today with black stool fatigue weakness and anemia.  On physical exam patient is overall well-appearing does have some left chest tenderness after a fall that occurred today she did not hit her head or lose consciousness.  She is on blood thinner.  Discussed case with my attending physician prior to admission.  Hemoglobin A1c 8 5.2 BMP was stable with poor kidney function. Fecal occult positive, INR mildly elevated may be likely secondary to cirrhosis, APTT somewhat elevated as well.  No significant liver function dyscrasias on CMP.   Type and screen obtained.  COVID test negative.  Patient will be n.p.o. after midnight Dr. Hilarie Fredrickson of gastroenterology is aware of patient.  No evidence of rib fractures on x-ray imaging. Plain film of the abdomen without any evidence of capsule endoscopy video capsule.  Clinical Course as of 05/29/20 1618  Thu May 27, 2020  1927 Discussed with Dr. Mamie Nick of [facility]. Recommends 2 units and admission.   [WF]  2111 Discussed with hospitalist who will admit. [WF]    Clinical Course User Index [WF] Tedd Sias, PA   MDM Rules/Calculators/A&P                          Patient will be admitted to the hospital for anemia and acute GI bleed with anemia that is worse than her baseline.  She appears stable for admission.  Vital signs are within normal limits with no tachycardia or hypotension.  She is receiving 2 units of blood will have to hold off on her anticoagulation for right now.  GI aware of patient.  Final Clinical Impression(s) / ED Diagnoses Final diagnoses:  Fall  Rib pain on left side  Acute GI bleeding    Rx / DC Orders ED Discharge Orders    None       Tedd Sias, [place] 05/29/20 1623    Truddie Hidden, MD 05/29/20 1640

## 2020-05-27 NOTE — Telephone Encounter (Signed)
Patient called requesting her results also stated she is feeling worst please advise

## 2020-05-27 NOTE — Telephone Encounter (Signed)
We will contact the patient, the capsule endoscope did not leave her stomach on recent capsule endoscopy test. We need to ask her if she has passed the capsule. If not she should come for a KUB at her convenience. We will need to likely place the capsule via endoscope in the future when weather and COVID restrictions permit.

## 2020-05-27 NOTE — ED Notes (Signed)
Pt states she went to her doctors to get blood work and was sent here for low hbg. Pt has been having weakness with both legs. States she has fallen twice today. Also states has had blurry vision on and off since last week.

## 2020-05-27 NOTE — Telephone Encounter (Signed)
Patient notified by X-ray that she should go home after her labs and x-rays and we will call with the results

## 2020-05-28 ENCOUNTER — Other Ambulatory Visit: Payer: Self-pay

## 2020-05-28 ENCOUNTER — Inpatient Hospital Stay (HOSPITAL_COMMUNITY): Payer: Medicare Other

## 2020-05-28 ENCOUNTER — Other Ambulatory Visit (HOSPITAL_COMMUNITY): Payer: Medicare Other

## 2020-05-28 DIAGNOSIS — D62 Acute posthemorrhagic anemia: Secondary | ICD-10-CM

## 2020-05-28 DIAGNOSIS — K31819 Angiodysplasia of stomach and duodenum without bleeding: Secondary | ICD-10-CM

## 2020-05-28 DIAGNOSIS — Z7901 Long term (current) use of anticoagulants: Secondary | ICD-10-CM

## 2020-05-28 DIAGNOSIS — K5521 Angiodysplasia of colon with hemorrhage: Principal | ICD-10-CM

## 2020-05-28 DIAGNOSIS — K921 Melena: Secondary | ICD-10-CM

## 2020-05-28 LAB — BASIC METABOLIC PANEL
Anion gap: 12 (ref 5–15)
BUN: 65 mg/dL — ABNORMAL HIGH (ref 8–23)
CO2: 20 mmol/L — ABNORMAL LOW (ref 22–32)
Calcium: 8.7 mg/dL — ABNORMAL LOW (ref 8.9–10.3)
Chloride: 107 mmol/L (ref 98–111)
Creatinine, Ser: 2.9 mg/dL — ABNORMAL HIGH (ref 0.44–1.00)
GFR, Estimated: 17 mL/min — ABNORMAL LOW (ref >60)
Glucose, Bld: 86 mg/dL (ref 70–99)
Potassium: 4 mmol/L (ref 3.5–5.1)
Sodium: 139 mmol/L (ref 135–145)

## 2020-05-28 LAB — CBC
HCT: 20.3 % — ABNORMAL LOW (ref 36.0–46.0)
Hemoglobin: 6.8 g/dL — CL (ref 12.0–15.0)
MCH: 30.8 pg (ref 26.0–34.0)
MCHC: 33.5 g/dL (ref 30.0–36.0)
MCV: 91.9 fL (ref 80.0–100.0)
Platelets: 171 10*3/uL (ref 150–400)
RBC: 2.21 MIL/uL — ABNORMAL LOW (ref 3.87–5.11)
RDW: 18.5 % — ABNORMAL HIGH (ref 11.5–15.5)
WBC: 6.4 10*3/uL (ref 4.0–10.5)
nRBC: 0 % (ref 0.0–0.2)

## 2020-05-28 LAB — HEMOGLOBIN AND HEMATOCRIT, BLOOD
HCT: 20.7 % — ABNORMAL LOW (ref 36.0–46.0)
Hemoglobin: 6.6 g/dL — CL (ref 12.0–15.0)

## 2020-05-28 LAB — GLUCOSE, CAPILLARY
Glucose-Capillary: 92 mg/dL (ref 70–99)
Glucose-Capillary: 96 mg/dL (ref 70–99)
Glucose-Capillary: 134 mg/dL — ABNORMAL HIGH (ref 70–99)

## 2020-05-28 LAB — PREPARE RBC (CROSSMATCH)

## 2020-05-28 MED ORDER — SPIRONOLACTONE 12.5 MG HALF TABLET: 12.5000 mg | ORAL_TABLET | Freq: Every day | ORAL | Status: DC

## 2020-05-28 MED ORDER — TORSEMIDE 20 MG PO TABS: 40.0000 mg | ORAL_TABLET | Freq: Every day | ORAL | Status: DC

## 2020-05-28 MED ORDER — CARVEDILOL 3.125 MG PO TABS
3.1250 mg | ORAL_TABLET | Freq: Two times a day (BID) | ORAL | Status: DC
  Administered 2020-05-28 – 2020-06-02 (×11): 3.125 mg via ORAL
  Filled 2020-05-28 (×11): qty 1

## 2020-05-28 MED ORDER — IOHEXOL 9 MG/ML PO SOLN
ORAL | Status: AC
  Filled 2020-05-28: qty 1000

## 2020-05-28 MED ORDER — PANTOPRAZOLE SODIUM 40 MG PO TBEC
40.0000 mg | DELAYED_RELEASE_TABLET | Freq: Every day | ORAL | Status: DC
  Administered 2020-05-29 – 2020-06-03 (×6): 40 mg via ORAL
  Filled 2020-05-28 (×6): qty 1

## 2020-05-28 MED ORDER — TORSEMIDE 20 MG PO TABS
40.0000 mg | ORAL_TABLET | Freq: Every day | ORAL | Status: DC
  Filled 2020-05-28: qty 2

## 2020-05-28 MED ORDER — PANTOPRAZOLE SODIUM 40 MG IV SOLR
40.0000 mg | Freq: Two times a day (BID) | INTRAVENOUS | Status: DC
  Administered 2020-05-28 (×2): 40 mg via INTRAVENOUS
  Filled 2020-05-28 (×2): qty 40

## 2020-05-28 MED ORDER — SODIUM CHLORIDE 0.9% IV SOLUTION: Freq: Once | INTRAVENOUS | Status: AC

## 2020-05-28 MED ORDER — SPIRONOLACTONE 12.5 MG HALF TABLET
12.5000 mg | ORAL_TABLET | Freq: Every day | ORAL | Status: DC
  Filled 2020-05-28: qty 1

## 2020-05-28 MED ORDER — AMIODARONE HCL 200 MG PO TABS
100.0000 mg | ORAL_TABLET | Freq: Every day | ORAL | Status: DC
  Administered 2020-05-28 – 2020-06-03 (×7): 100 mg via ORAL
  Filled 2020-05-28 (×7): qty 1

## 2020-05-28 MED ORDER — ISOSORB DINITRATE-HYDRALAZINE 20-37.5 MG PO TABS
1.0000 | ORAL_TABLET | Freq: Three times a day (TID) | ORAL | Status: DC
  Administered 2020-05-28: 1 via ORAL
  Filled 2020-05-28: qty 1

## 2020-05-28 NOTE — Plan of Care (Signed)
  Problem: Clinical Measurements: Goal: Ability to maintain clinical measurements within normal limits will improve Outcome: Progressing Goal: Will remain free from infection Outcome: Progressing Goal: Diagnostic test results will improve Outcome: Progressing Goal: Respiratory complications will improve Outcome: Progressing Goal: Cardiovascular complication will be avoided Outcome: Progressing   Problem: Activity: Goal: Risk for activity intolerance will decrease Outcome: Progressing   Problem: Pain Managment: Goal: General experience of comfort will improve Outcome: Progressing   

## 2020-05-28 NOTE — Consult Note (Addendum)
[facility] Consult: 8:26 AM 05/28/2020  LOS: 1 day    Referring Provider: Dr Irene Pap  Primary Care Physician:  Joylene Grapes MD. she has upcoming appointment to establish care with him.  Appointment is in March. Primary Gastroenterologist:  [name]     Reason for Consultation:  Recurrent, chronic gi blod loss anemia   HPI: Morgan Clay is a 75 y.o. female.  PMH compensated cirrhosis of the liver, trace esophageal varices.  Hep C eradicated with Harvoni. Chronic Eliquis for A. Fib.  Cardioversion 02/12/2020.  Non-ischemic CM, CHF, s/p pacemaker,  AICD.  Followed in Heart failure clinic.  CKD, stage 4/GFR mid 20s at baseline.  2020 sigmoid colectomy to address diverticulitis.  OSA not on CPAP.  Tested + for Covid 19 on 05/03/2020, completed isolation.     Hx of GI bleeding and anemia, multiple work-ups in the past.  Evaluation of Hgb 6.9, dark stools during admission in late 02/2020. 2 PRBCs, Feraheme in 02/2020. 2 PRBCs 12/21 for Hgb 7.7, ongoing dark stools.     10/26/2017 EGD:  For melena.  Benign, nonobstructing esophageal stenosis. Solitary, nonbleeding AVM in stomach treated with APC.  Normal duodenum to D2 11/07/2017 EGD: For melena a week and a half following EGD w APC of gastric AVM.  Small ulcer at site of recently APCd AVM, likley source for bleeding 11/08/2017 colonoscopy: Pandiverticulosis.  4 mm polyp removed from transverse colon.  Suspect anemia related to the gastric AVM treated 2 weeks prior. 12/2017 capsule endoscopy: Complete study with fair prep.  1 medium to large, nonbleeding AVM at 2 hours 56 minutes is 1 hour and 30 minutes beyond first duodenal image.  Otherwise negative study.  This AVM not reachable by enteroscopy and if further active bleeding would need referral for deep  enteroscopy 10/ 26/2021  small bowel enteroscopy showed trace column esophageal varix w/o stigmata or bleeding.  Superficial AVMs and punctate lesions in the prepylorus were obliterated with APC.  Some scope trauma in the stomach noted.  Deviously placed tattoo at the extent of prior enteroscopy noted.  Dr. Arelia Longest suspected bleeding and anemia from the gastric findings but more distal, nonvisualized, distal small bowel AVMs could also be a source. Recently tested positive for COVID-19 as of 12/27.  This resulted in postponement of planned capsule endoscopy. 05/19/20 VCE.  Non-diagnostic as capsule never left the stomach.  Recently experiencing multiple falls, leg weakness.  Had a brief syncopal spell yesterday.  "Jet black" stools, occasionally may see more of a burgundy color stool.[name]  Has yet to visualize passed capsule. Hgb 5.1 yesterday, advised to go to the emergency department. 05/27/2020 KUB with no evidence for retained capsule or bowel obstruction.   Hgb this a.m. 6.8 after 1 PRBC.  Overall improved GFR c/w 03/2020 but still CKD stage 4.  BUN stable in mid 60s.  INR 2.1.   Last Eliquis was 05/27/20, now held.    Past Medical History:  Diagnosis Date   A-fib [facility])    on Eliquis   AICD (automatic cardioverter/defibrillator) present 10/06/2015  Anemia    ARF (acute renal failure) (HCC)    Arthritis    "qwhere" (01/03/2018)   Asthma    reports mild asthma since childhood - had COPD on dx list from prior PCP   Chronic bronchitis ([place])    "get it most years; not this past year though" (01/03/2018)   Chronic kidney disease    "? stage;  followed by [facility]; [name]" (01/03/2018)   Chronic systolic congestive heart failure ([place])    COVID-19 05/03/2020   DEPRESSION 12/17/2009   Annotation: PHQ-9 score = 14 done on 12/17/2009 Qualifier: Diagnosis of  By: Hassell Done FNP, [place]     Diabetes mellitus without complication ([place])    DIET CONTROLLED    Diverticulosis     Enteritis    FIBROIDS, UTERUS 03/05/2008   GI bleeding 01/03/2018   Glaucoma    Gout    Hepatitis C    HEPATITIS C - s/p treatment with Harvoni, saw hepatology, Dawn Drazek   History of blood transfusion ~ 11/2017   Hyperlipemia 12/06/2012   HYPERTENSION, BENIGN 04/24/2007   Hypothyroidism    LBBB (left bundle branch block)    Lower GI bleeding    "been dealing w/it since 07/2017" (01/03/2018)   Nonischemic cardiomyopathy ([place])    OBESITY 05/27/2009   OBSTRUCTIVE SLEEP APNEA 11/14/2007   no CPAP   OSTEOPENIA 09/30/2008   Pain and swelling of left upper extremity 08/08/2017   Personal history of other infectious and parasitic disease    Hepatitis B   Pneumonia    "several times" (01/03/2018)   Pulmonary edema, acute ([place]) 08/09/2017    Past Surgical History:  Procedure Laterality Date   APPENDECTOMY     CARDIAC CATHETERIZATION     CARDIOVERSION N/A 12/14/2014   Procedure: CARDIOVERSION;  Surgeon: Dorothy Spark, MD;  Location: [place];  Service: Cardiovascular;  Laterality: N/A;   CARDIOVERSION N/A 02/26/2017   Procedure: CARDIOVERSION;  Surgeon: Evans Lance, MD;  Location: [place] CV LAB;  Service: Cardiovascular;  Laterality: N/A;   CARDIOVERSION N/A 07/24/2017   Procedure: CARDIOVERSION;  Surgeon: Acie Fredrickson Wonda Cheng, MD;  Location: [facility] ENDOSCOPY;  Service: Cardiovascular;  Laterality: N/A;   CARDIOVERSION N/A 02/12/2020   Procedure: CARDIOVERSION;  Surgeon: [name], MD;  Location: [facility] ENDOSCOPY;  Service: Cardiovascular;  Laterality: N/A;   COLONOSCOPY N/A 11/08/2017   Procedure: COLONOSCOPY;  Surgeon: Milus Banister, MD;  Location: [facility] ENDOSCOPY;  Service: Endoscopy;  Laterality: N/A;   DILATION AND CURETTAGE OF UTERUS     ENTEROSCOPY N/A 03/02/2020   Procedure: ENTEROSCOPY;  Surgeon: Yetta Flock, MD;  Location: [facility] ENDOSCOPY;  Service: Gastroenterology;  Laterality: N/A;   EP IMPLANTABLE DEVICE N/A 10/06/2015   Procedure: BIV  ICD Generator Changeout;  Surgeon: [name], MD;  Location: [place] CV LAB;  Service: Cardiovascular;  Laterality: N/A;   ESOPHAGOGASTRODUODENOSCOPY N/A 10/26/2017   Procedure: ESOPHAGOGASTRODUODENOSCOPY (EGD);  Surgeon: [place] Artist, MD;  Location: [facility];  Service: Endoscopy;  Laterality: N/A;   ESOPHAGOGASTRODUODENOSCOPY (EGD) WITH PROPOFOL N/A 11/07/2017   Procedure: ESOPHAGOGASTRODUODENOSCOPY (EGD) WITH PROPOFOL;  Surgeon: Milus Banister, MD;  Location: [facility] ENDOSCOPY;  Service: Endoscopy;  Laterality: N/A;   GIVENS CAPSULE STUDY N/A 05/19/2020   Procedure: GIVENS CAPSULE STUDY;  Surgeon: [name], MD;  Location: [place];  Service: Endoscopy;  Laterality: N/A;  .adm for obs since pacemaker, PA wil enter order and see pt   HOT HEMOSTASIS N/A 10/26/2017   Procedure:  HOT HEMOSTASIS (ARGON PLASMA COAGULATION/BICAP);  Surgeon: [place] Artist, MD;  Location: [facility] ENDOSCOPY;  Service: Endoscopy;  Laterality: N/A;   HOT HEMOSTASIS N/A 03/02/2020   Procedure: HOT HEMOSTASIS (ARGON PLASMA COAGULATION/BICAP);  Surgeon: Yetta Flock, MD;  Location: [facility] ENDOSCOPY;  Service: Gastroenterology;  Laterality: N/A;   INSERT / REPLACE / REMOVE PACEMAKER  ?2008   POLYPECTOMY  11/08/2017   Procedure: POLYPECTOMY;  Surgeon: Milus Banister, MD;  Location: [facility] ENDOSCOPY;  Service: Endoscopy;;   PROCTOSCOPY N/A 05/22/2018   Procedure: RIGID PROCTOSCOPY;  Surgeon: Michael Boston, MD;  Location: [facility] ORS;  Service: General;  Laterality: N/A;   RIGHT HEART CATH N/A 02/13/2018   Procedure: RIGHT HEART CATH;  Surgeon: [name], MD;  Location: [place] CV LAB;  Service: Cardiovascular;  Laterality: N/A;   RIGHT/LEFT HEART CATH AND CORONARY ANGIOGRAPHY N/A 12/11/2019   Procedure: RIGHT/LEFT HEART CATH AND CORONARY ANGIOGRAPHY;  Surgeon: [name], MD;  Location: [place] CV LAB;  Service: Cardiovascular;  Laterality: N/A;   SUBMUCOSAL TATTOO INJECTION  03/02/2020    Procedure: SUBMUCOSAL TATTOO INJECTION;  Surgeon: Yetta Flock, MD;  Location: [place] ENDOSCOPY;  Service: Gastroenterology;;   TUBAL LIGATION     XI ROBOTIC ASSISTED LOWER ANTERIOR RESECTION N/A 05/22/2018   Procedure: XI ROBOTIC ASSISTED SIGMOID COLOECTOMY MOBILIZATION OF SPLENIC FLEXURE, FIREFLY ASSESSMENT OF PERFUSION;  Surgeon: Michael Boston, MD;  Location: [facility] ORS;  Service: General;  Laterality: N/A;  ERAS PATHWAY    Prior to Admission medications   Medication Sig Start Date End Date Taking? Authorizing Provider  allopurinol (ZYLOPRIM) 300 MG tablet Take 300 mg by mouth daily.    Yes [provider]  amiodarone (PACERONE) 200 MG tablet Take 1 tablet (200 mg total) by mouth daily. Patient taking differently: Take 200 mg by mouth 2 (two) times daily. 03/15/20  Yes Fenton, Clint R, PA  apixaban (ELIQUIS) 5 MG TABS tablet Take 1 tablet (5 mg total) by mouth 2 (two) times daily. 12/12/19  Yes [name], MD  carvedilol (COREG) 25 MG tablet Take 1 tablet by mouth twice daily 05/13/20  Yes [name], MD  colchicine 0.6 MG tablet Take 0.6 mg by mouth daily. 02/06/18  Yes [provider]  ferrous sulfate 325 (65 FE) MG tablet Take 1 tablet (325 mg total) by mouth daily. 03/04/20 03/04/21 Yes [place] Eric J, DO  gabapentin (NEURONTIN) 300 MG capsule Take 1 capsule (300 mg total) by mouth 2 (two) times daily. 07/17/19  Yes Billie Ruddy, MD  isosorbide-hydrALAZINE (BIDIL) 20-37.5 MG tablet Take 1 tablet by mouth 3 (three) times daily. 12/05/19  Yes [name], MD  levothyroxine (SYNTHROID) 137 MCG tablet Take 1 tablet (137 mcg total) by mouth daily before breakfast. 03/11/20  Yes Billie Ruddy, MD  nitroGLYCERIN (NITROSTAT) 0.4 MG SL tablet Place 0.4 mg under the tongue every 5 (five) minutes x 3 doses as needed for chest pain.    Yes [provider]  pantoprazole (PROTONIX) 40 MG tablet Take 1 tablet (40 mg total) by mouth daily. 04/06/20 04/06/21 Yes  Clegg, [name] D, NP  spironolactone (ALDACTONE) 25 MG tablet Take 0.5 tablets (12.5 mg total) by mouth daily. 12/05/19 03/23/22 Yes [name], MD  tiZANidine (ZANAFLEX) 2 MG tablet Take 1 tablet (2 mg total) by mouth at bedtime as needed for muscle spasms. 04/07/20  Yes Billie Ruddy, MD  torsemide (DEMADEX) 20 MG tablet Take 2 tablets (40 mg total) by  mouth daily. 05/24/20 08/22/20 Yes [name], MD  traZODone (DESYREL) 50 MG tablet Take 50 mg by mouth at bedtime.   Yes [provider]    Scheduled Meds:  amiodarone  100 mg Oral Daily   carvedilol  3.125 mg Oral BID   isosorbide-hydrALAZINE  1 tablet Oral TID   levothyroxine  137 mcg Oral QAC breakfast   pantoprazole (PROTONIX) IV  40 mg Intravenous Q12H   [START ON 05/29/2020] spironolactone  12.5 mg Oral Daily   [START ON 05/29/2020] torsemide  40 mg Oral Daily   Infusions:  PRN Meds: acetaminophen **OR** acetaminophen   Allergies as of 05/27/2020   (No Known Allergies)    Family History  Problem Relation Age of Onset   Asthma Father    Heart attack Father    Asthma Sister    Lung cancer Sister    Heart attack Mother    Stroke Brother    Colon cancer Neg Hx    Esophageal cancer Neg Hx    Pancreatic cancer Neg Hx    Stomach cancer Neg Hx    Liver disease Neg Hx     Social History   Socioeconomic History   Marital status: Widowed    Spouse name: Not on file   Number of children: 1   Years of education: 21   Highest education level: Not on file  Occupational History   Not on file  Tobacco Use   Smoking status: Never Smoker   Smokeless tobacco: Never Used  Vaping Use   Vaping Use: Never used  Substance and Sexual Activity   Alcohol use: Yes    Comment: 01/03/2018 "couple drinks/month"   Drug use: Yes    Types: Marijuana    Comment: VERY INTERMITTENT    Sexual activity: Not Currently  Other Topics Concern   Not on file  Social History Narrative   Work or  School: retiring from [employer]      Home Situation: lives alone      Spiritual Beliefs: Christian - Baptist      Lifestyle: no regular exercise; poor diet      She is divorced at least one adult child   Never smoker    rare alcohol to occasional at best    no substance abuse         Social Determinants of Radio broadcast assistant Strain: Not on file  Food Insecurity: Not on file  Transportation Needs: Not on file  Physical Activity: Not on file  Stress: Not on file  Social Connections: Not on file  Intimate Partner Violence: Not on file    REVIEW OF SYSTEMS: Constitutional: Found weakness, fatigue ENT:  No nose bleeds Pulm: No acute shortness of breath CV:  No palpitations, no LE edema.  No angina GU:  No hematuria, no frequency GI: See HPI.  No nausea, vomiting.  Occasional belly discomfort but no severe pain Heme: Other than the dark stools, she doesn't have any unusual tendency to bleed or bruise. Transfusions: See HPI  Neuro:  No headaches, no peripheral tingling or numbness.  Brief syncope at home. Derm:  No itching, no rash or sores.  Endocrine:  No sweats or chills.  No polyuria or dysuria Immunization: Not queried. Travel:  None beyond local counties in last few months.    PHYSICAL EXAM: Vital signs in last 24 hours: Vitals:   05/27/20 2335 05/28/20 0047  BP: (!) 118/43 (!) 155/60  Pulse:  65  Resp:  17 17  Temp: 98.4 F (36.9 C) 98.2 F (36.8 C)  SpO2: 100% 100%   Wt Readings from Last 3 Encounters:  05/27/20 63.5 kg  05/19/20 63.5 kg  04/15/20 64.5 kg    General: Patient is pleasant, alert, calm.  Looks somewhat chronically ill but in no distress and not acutely ill. Head: No facial asymmetry or swelling.  No signs of head trauma Eyes: No scleral icterus.  Conjunctiva pale. Ears: Not hard of hearing Nose: No discharge Mouth: Moist, pink, clear oral mucosa.  Tongue midline. Neck: No JVD, masses, thyromegaly Lungs: Clear bilaterally.  No  labored breathing, no cough Heart: Regular, paced rhythm, rate 60 on telemetry monitor.  Soft systolic murmur.  Pacer/AICD upper chest wall Abdomen: Soft, nontender, nondistended..   Rectal: Stool is burgundy, soft. Musc/Skeltl: No joint redness or swelling. Extremities: No CCE Neurologic: Oriented x3.  Good historian.  Moves all 4 limbs without tremor, strength not tested Skin: No sores, rashes or suspicious lesions Nodes: No cervical adenopathy Psych: Calm, pleasant, in good spirits, fluid speech.  Intake/Output from previous day: 01/20 0701 - 01/21 0700 In: 371 [Blood:321; IV Piggyback:50] Out: -  Intake/Output this shift: No intake/output data recorded.  LAB RESULTS: Recent Labs    05/27/20 1038 05/27/20 1357 05/28/20 0230  WBC 6.4 7.1 6.4  HGB 5.1 cL* 5.2* 6.8*  HCT 15.7 cL* 16.8* 20.3*  PLT 191.0 217 171   BMET Lab Results  Component Value Date   NA 139 05/28/2020   NA 137 05/27/2020   NA 138 05/27/2020   K 4.0 05/28/2020   K 4.5 05/27/2020   K 4.5 05/27/2020   CL 107 05/28/2020   CL 104 05/27/2020   CL 106 05/27/2020   CO2 20 (L) 05/28/2020   CO2 20 (L) 05/27/2020   CO2 23 05/27/2020   GLUCOSE 86 05/28/2020   GLUCOSE 120 (H) 05/27/2020   GLUCOSE 105 (H) 05/27/2020   BUN 65 (H) 05/28/2020   BUN 66 (H) 05/27/2020   BUN 67 (H) 05/27/2020   CREATININE 2.90 (H) 05/28/2020   CREATININE 2.89 (H) 05/27/2020   CREATININE 2.78 (H) 05/27/2020   CALCIUM 8.7 (L) 05/28/2020   CALCIUM 9.0 05/27/2020   CALCIUM 9.0 05/27/2020   LFT Recent Labs    05/27/20 1357  PROT 7.0  ALBUMIN 3.6  AST 18  ALT 14  ALKPHOS 75  BILITOT 0.7   PT/INR Lab Results  Component Value Date   INR 2.1 (H) 05/27/2020   INR 1.3 (H) 12/05/2019   INR 1.26 02/08/2018   Hepatitis Panel No results for input(s): HEPBSAG, HCVAB, HEPAIGM, HEPBIGM in the last 72 hours. C-Diff No components found for: CDIFF Lipase     Component Value Date/Time   LIPASE 39 02/27/2018 1730    Drugs  of Abuse     Component Value Date/Time   LABOPIA NONE DETECTED 10/02/2015 0329   COCAINSCRNUR NONE DETECTED 10/02/2015 0329   LABBENZ NONE DETECTED 10/02/2015 0329   AMPHETMU NONE DETECTED 10/02/2015 0329   THCU POSITIVE (A) 10/02/2015 0329   LABBARB NONE DETECTED 10/02/2015 0329     RADIOLOGY STUDIES: DG Ribs Unilateral W/Chest Left  Result Date: 05/27/2020 CLINICAL DATA:  Fall, left rib pain EXAM: LEFT RIBS AND CHEST - 3+ VIEW COMPARISON:  Radiograph 03/01/2020 FINDINGS: Pacer/defibrillator battery pack overlies the left chest wall. No displaced or other visible rib fractures are identified. No acute traumatic abnormalities of the chest wall are clearly evident on radiography. There is some increasing interstitial  opacities in a perihilar and basilar predominance with mild vascular cuffing and fissural and septal thickening suggestive of at least mild mild interstitial pulmonary edema. Pulmonary vascular congestion and mild cardiomegaly are similar to priors. No pneumothorax or effusion. No acute osseous or soft tissue abnormality. Degenerative changes are present in the imaged spine and shoulders. IMPRESSION: 1. No visible displaced rib fractures or other acute traumatic abnormality of the chest wall 2. Features suggestive of CHF with pulmonary edema, cardiomegaly and vascular congestion. 3. No pneumothorax or effusion. Electronically Signed   By: Lovena Le M.D.   On: 05/27/2020 20:22   DG Abdomen 1 View  Result Date: 05/27/2020 CLINICAL DATA:  Capsule endoscopy 8 days prior, concern for retention EXAM: ABDOMEN - 1 VIEW COMPARISON:  Radiograph 05/27/2020, CT 06/19/2018 FINDINGS: Endoscopic capsule is not visualized over the abdomen or pelvis. Redemonstration of surgical clips in the low abdomen as well as heterogeneous calcification the pelvis compatible with calcified uterine fibroids. Few clustered air-filled loops of bowel in the left hemiabdomen are not significantly distended. No  convincing residual high-grade obstructive pattern is seen. No acute osseous or other soft tissue abnormality. Degenerative changes in the spine, hips and pelvis. Some atelectatic changes in the bases. Terminus of a pacer/defibrillator device noted over the lower cardiomediastinal silhouette. IMPRESSION: 1. Endoscopic capsule is not visualized over the abdomen or pelvis. 2. Few clustered air-filled loops of bowel in the left hemiabdomen are not significantly distended, a nonspecific finding on likely reflect high-grade obstruction though should correlate for abdominal symptoms. Electronically Signed   By: Lovena Le M.D.   On: 05/27/2020 20:02   DG Abd 1 View  Result Date: 05/27/2020 CLINICAL DATA:  Evaluate for retained capsule.  Asymptomatic. EXAM: ABDOMEN - 1 VIEW COMPARISON:  08/11/2017 FINDINGS: Single supine view of the abdomen and pelvis. Surgical clips in the upper pelvis. No gaseous distention of bowel loops. Calcified uterine fibroid is eccentric left. No evidence of metallic retained capsule. IMPRESSION: No evidence of retained capsule or bowel obstruction. Electronically Signed   By: [name] M.D.   On: 05/27/2020 16:06     IMPRESSION:   *   Recurrent, acute on chronic GI blood loss anemia. Several studies within the past year noted above.  Most recently failed attempt at VCE 05/19/20 as capsule never left the stomach.  By yesterday's KUB the capsule is not visualized though the patient has not seen it emerged in her stools.  Thus far source of bleeding is gastric AVMs, suspect SB AVMs as well  *    Ischemic cardiomyopathy.  Chronic Eliquis.  Last dose 1/20, yesterday  *    Stage 4 CKD.  *    Compensated cirrhosis.  Eradicated hep C.  *    COVID-19 positive 12/27, completed quarantine/isolation   PLAN:     *   Per Dr Loletha Carrow, will see patient later today  *    Given that this GI bleeding is from AVMs, she did doesn't need to have a clear diet so I advanced her to heart  healthy diet.  In event of pursuing another enteroscopy or placement of capsule into the small bowel, can convert her over to clears but this is not planned for today.  *   May need to reconsider the use of Eliquis.  ? Ask cards to weigh in on [facility] going forward?   *  ?  Transfuse 1 more PRBC?   [name]  05/28/2020, 8:26 AM Phone [phone]  I have reviewed the entire case in detail with the above APP and discussed the plan in detail.  Therefore, I agree with the diagnoses recorded above. In addition,  I have personally interviewed and examined the patient and have personally reviewed any abdominal/pelvic CT scan images.  My additional thoughts are as follows:  Acute on chronic anemia of GI blood loss due to suspected small bowel AVMs. Known history of gastric AVMs. Recurrent overt bleeding this admission leading to significant drop in hemoglobin below baseline that is definitely symptomatic. Chronic OAC for A. fib, also CKD and CHF.  Recent video capsule study nondiagnostic, retained in the stomach.  Plan is to transfuse 1 unit PRBCs. I communicated with the [facility] physician who was also plan to do so and will place the orders.  I recommended the patient undergo upper endoscopy with placement of a video capsule study into the small bowel. She was agreeable after discussion of procedure and risks.  The benefits and risks of the planned procedure were described in detail with the patient or (when appropriate) their health care proxy.  Risks were outlined as including, but not limited to, bleeding, infection, perforation, adverse medication reaction leading to cardiac or pulmonary decompensation, pancreatitis (if ERCP).  The limitation of incomplete mucosal visualization was also discussed.  No guarantees or warranties were given.  Patient at increased risk for cardiopulmonary complications of procedure due to medical comorbidities.  Eliquis will be washed out by tomorrow to make that  procedure feasible, however we are still working on the endoscopy/anesthesia schedule to see if that will allow the procedure tomorrow or the next day.  I explained all this to the patient and she understands the complexity of the situation and scheduling. I will put her back on a clear liquid diet for now and n.p.o. after midnight. We will let her know later today if the definite plan is for EGD with capsule placement tomorrow. If so, 1 L of MoviPrep will be given this evening. She is also currently on a monitored bed, and she will need to remain on that for the duration of her video capsule study because she has a pacer/AICD, and there is the low likelihood of the capsule device interfering with her cardiac device.  This was known to be the case for her recent study, which is why it was performed in the hospital outpatient setting.  CBC in AM.  If video capsule study reveals AVMs that are beyond the reach of endoscopic management, then a discussion with cardiology will be warranted regarding consideration of permanent OAC discontinuation (? Patient candidate for a watchman device).  [name] [phone]

## 2020-05-28 NOTE — Progress Notes (Signed)
PROGRESS NOTE  Morgan Clay BDZ:[id number] DOB: 03-Aug-1945 DOA: 05/27/2020 PCP: Patient, No Pcp Per  HPI/Recap of past 24 hours:  Morgan Clay is a 75 y.o. female with history of chronic anemia and GI bleed who had a recent capsule endoscopy and it was not sure if it left the stomach.  Was advised to come to the ER.  On arrival to the ER patient complained of shortness of breath and chronic melanotic stools.  Denies any abdominal pain nausea vomiting or chest pain.  Denies any fever or chills.  ED Course: The ER KUB did not show any features for capsule endoscopy.  Did show some concerning features for bowel obstruction but patient denies any nausea vomiting last bowel movement was around 3 days ago.  On presentation, labs were significant for hemoglobin of 5.2, positive FOBT, INR was 2.1 creatinine 2.8.  Patient was admitted for acute on chronic GI bleed.  Patient has had a recent COVID test positive on December 27, 21.  Patient is asymptomatic.  05/28/20: Patient was seen and examined at her bedside.  She denies any overt bleeding.  Hemoglobin down to 6.8K this morning.  She has received 2 units PRBCs.  We will repeat an H&H.    Assessment/Plan: Principal Problem:   Acute GI bleeding Active Problems:   Hypothyroidism   OBSTRUCTIVE SLEEP APNEA   Chronic diastolic CHF (congestive heart failure) (HCC)   Nonischemic cardiomyopathy (HCC)   Type 2 diabetes, controlled, with renal manifestation (HCC)   CKD (chronic kidney disease) stage 4, GFR 15-29 ml/min    Atrial fibrillation, chronic   AVM (arteriovenous malformation) of small bowel, acquired   Anemia   Acute blood loss anemia   COVID-19 virus infection  Acute on chronic GI bleed She was recently seen by GI, she had capsule endoscopy.  It was clear whether or not she had passed it. Presented with hemoglobin of 5.2 with positive FOBT 2 unit PRBCs ordered to be transfused Repeated hemoglobin this morning 6.8 Continue to  hold off home oral anticoagulation, Eliquis. Patient denies any overt bleeding Obtain repeat H&H Continue to closely monitor H&H She is currently undergoing bowel prep. [facility] consulted by admitting physician  Acute blood loss anemia in the setting of suspected upper GI bleed Management as stated above Continue PPI intravenously twice daily. Transfuse hemoglobin less than 7.0  Essential hypertension BP meds on hold except for amiodarone and Coreg due to history of A. fib/A.  Flutter. Closely monitor blood pressure and maintain MAP greater than 65  Paroxysmal A. fib/A-flutter She is on Eliquis at home, her last dose was on 05/27/2020 Continue to hold of Eliquis for now due to suspected GI bleed Defer to GI to restart anticoagulation Continue low-dose of Coreg to avoid beta-blockade withdrawal Continue amiodarone at lower dose 100 mg daily  Chronic diastolic CHF Euvolemic on exam Last 2D echo done on 12/29/2019 showed normal LVEF 50 to 55% with grade 2 diastolic dysfunction Start strict I's and O's and daily weight Most cardiac medications on hold to avoid hypotension in the setting of GI bleed  Hypothyroidism Update TSH Resume home levothyroxine     Code Status: Full code  Family Communication: None at bedside.  Disposition Plan: Likely will discharge to home on 05/30/2020   Consultants:  GI  Procedures: None  Antimicrobials:  None  DVT prophylaxis: SCDs  Status is: Inpatient   Dispo:  Patient From: Home  Planned Disposition: Home  Expected discharge date: 05/31/2020  Medically stable  for discharge: No, ongoing management of suspected upper GI bleed, acute blood loss, anemia requiring blood transfusion.         Objective: Vitals:   05/27/20 2245 05/27/20 2335 05/28/20 0047 05/28/20 1000  BP: (!) 131/48 (!) 118/43 (!) 155/60 (!) 116/52  Pulse:   65 60  Resp: 14 17 17 15   Temp:  98.4 F (36.9 C) 98.2 F (36.8 C) (!) 97.5 F (36.4 C)   TempSrc:  Oral  Oral  SpO2:  100% 100% 100%  Weight:      Height:        Intake/Output Summary (Last 24 hours) at 05/28/2020 1139 Last data filed at 05/27/2020 2344 Gross per 24 hour  Intake 371 ml  Output --  Net 371 ml   Filed Weights   05/27/20 1344  Weight: 63.5 kg    Exam:  . General: 75 y.o. year-old female well developed well nourished in no acute distress.  Alert and oriented x3. . Cardiovascular: Regular rate and rhythm with no rubs or gallops.  No thyromegaly or JVD noted.   [name] Respiratory: Clear to auscultation with no wheezes or rales. Good inspiratory effort. . Abdomen: Soft nontender nondistended with normal bowel sounds x4 quadrants. . Musculoskeletal: No lower extremity edema. 2/4 pulses in all 4 extremities. . Skin: No ulcerative lesions noted or rashes . Psychiatry: Mood is appropriate for condition and setting   Data Reviewed: CBC: Recent Labs  Lab 05/27/20 1038 05/27/20 1357 05/28/20 0230  WBC 6.4 7.1 6.4  HGB 5.1 cL* 5.2* 6.8*  HCT 15.7 cL* 16.8* 20.3*  MCV 90.6 93.9 91.9  PLT 191.0 217 559   Basic Metabolic Panel: Recent Labs  Lab 05/27/20 1038 05/27/20 1357 05/28/20 0230  NA 138 137 139  K 4.5 4.5 4.0  CL 106 104 107  CO2 23 20* 20*  GLUCOSE 105* 120* 86  BUN 67* 66* 65*  CREATININE 2.78* 2.89* 2.90*  CALCIUM 9.0 9.0 8.7*   GFR: Estimated Creatinine Clearance: 14.2 mL/min (A) (by C-G formula based on SCr of 2.9 mg/dL (H)). Liver Function Tests: Recent Labs  Lab 05/27/20 1357  AST 18  ALT 14  ALKPHOS 75  BILITOT 0.7  PROT 7.0  ALBUMIN 3.6   No results for input(s): LIPASE, AMYLASE in the last 168 hours. No results for input(s): AMMONIA in the last 168 hours. Coagulation Profile: Recent Labs  Lab 05/27/20 1843  INR 2.1*   Cardiac Enzymes: No results for input(s): CKTOTAL, CKMB, CKMBINDEX, TROPONINI in the last 168 hours. BNP (last 3 results) Recent Labs    07/17/19 1352 07/23/19 1033  PROBNP 811.0* 637.0*    HbA1C: No results for input(s): HGBA1C in the last 72 hours. CBG: No results for input(s): GLUCAP in the last 168 hours. Lipid Profile: No results for input(s): CHOL, HDL, LDLCALC, TRIG, CHOLHDL, LDLDIRECT in the last 72 hours. Thyroid Function Tests: No results for input(s): TSH, T4TOTAL, FREET4, T3FREE, THYROIDAB in the last 72 hours. Anemia Panel: Recent Labs    05/27/20 1038  FERRITIN 42.9   Urine analysis:    Component Value Date/Time   COLORURINE YELLOW 03/05/2018 0902   APPEARANCEUR CLEAR 03/05/2018 0902   LABSPEC 1.008 03/05/2018 0902   PHURINE 5.0 03/05/2018 0902   GLUCOSEU NEGATIVE 03/05/2018 0902   HGBUR NEGATIVE 03/05/2018 0902   HGBUR negative 02/28/2008 0000   BILIRUBINUR NEGATIVE 03/05/2018 0902   KETONESUR 5 (A) 03/05/2018 0902   PROTEINUR NEGATIVE 03/05/2018 0902   UROBILINOGEN 1.0 07/19/2010 1752  NITRITE NEGATIVE 03/05/2018 0902   LEUKOCYTESUR NEGATIVE 03/05/2018 0902   Sepsis Labs: @LABRCNTIP (procalcitonin:4,lacticidven:4)  ) Recent Results (from the past 240 hour(s))  SARS Coronavirus 2 by RT PCR (hospital order, performed in [facility] hospital lab) Nasopharyngeal Nasopharyngeal Swab     Status: None   Collection Time: 05/27/20  8:08 PM   Specimen: Nasopharyngeal Swab  Result Value Ref Range Status   SARS Coronavirus 2 NEGATIVE NEGATIVE Final    Comment: (NOTE) SARS-CoV-2 target nucleic acids are NOT DETECTED.  The SARS-CoV-2 RNA is generally detectable in upper and lower respiratory specimens during the acute phase of infection. The lowest concentration of SARS-CoV-2 viral copies this assay can detect is 250 copies / mL. A negative result does not preclude SARS-CoV-2 infection and should not be used as the sole basis for treatment or other patient management decisions.  A negative result may occur with improper specimen collection / handling, submission of specimen other than nasopharyngeal swab, presence of viral mutation(s) within  the areas targeted by this assay, and inadequate number of viral copies (<250 copies / mL). A negative result must be combined with clinical observations, patient history, and epidemiological information.  Fact Sheet for Patients:   StrictlyIdeas.no  Fact Sheet for Healthcare Providers: BankingDealers.co.za  This test is not yet approved or  cleared by the [place] FDA and has been authorized for detection and/or diagnosis of SARS-CoV-2 by FDA under an Emergency Use Authorization (EUA).  This EUA will remain in effect (meaning this test can be used) for the duration of the COVID-19 declaration under Section 564(b)(1) of the Act, 21 U.S.C. section 360bbb-3(b)(1), unless the authorization is terminated or revoked sooner.  Performed at [facility], [place] [address], [place], [place] [address]       Studies: CT ABDOMEN PELVIS WO CONTRAST  Result Date: 05/28/2020 CLINICAL DATA:  Abdominal distention. Recent capsule endoscopy on 05/19/2020. EXAM: CT ABDOMEN AND PELVIS WITHOUT CONTRAST TECHNIQUE: Multidetector CT imaging of the abdomen and pelvis was performed following the standard protocol without IV contrast. COMPARISON:  06/19/2018 FINDINGS: Lower chest: Heart is enlarged without substantial pericardial effusion where visualized. Small right pleural effusion is similar to prior. Hepatobiliary: Nodular liver contour (see inferior right liver on 34/3) suggests cirrhosis. Subtle high attenuation material layering in the gallbladder lumen may be related to sludge or tiny stones. No intrahepatic or extrahepatic biliary dilation. Pancreas: No focal mass lesion. No dilatation of the main duct. No intraparenchymal cyst. No peripancreatic edema. Spleen: No splenomegaly. No focal mass lesion. Adrenals/Urinary Tract: No adrenal nodule or mass. Unremarkable noncontrast appearance of the kidneys. No evidence for hydroureter. The urinary bladder  appears normal for the degree of distention. Stomach/Bowel: Stomach is moderately distended with contrast material. Duodenum is normally positioned as is the ligament of Treitz. No small bowel wall thickening. No small bowel dilatation. The terminal ileum is normal. The appendix is not visualized, but there is no edema or inflammation in the region of the cecum. Status post left hemicolectomy. Diverticuli are seen scattered along the entire length of the colon without CT findings of diverticulitis. Oral contrast material for today's study has migrated through small bowel and colon to the level of the rectum. No retained capsule endoscope in the abdomen or pelvis. Vascular/Lymphatic: There is abdominal aortic atherosclerosis without aneurysm. There is no gastrohepatic or hepatoduodenal ligament lymphadenopathy. No retroperitoneal or mesenteric lymphadenopathy. No pelvic sidewall lymphadenopathy. Reproductive: Stable appearance large calcified uterine fibroid. There is no adnexal mass. Other: No intraperitoneal  free fluid. Musculoskeletal: Small left groin hernia contains fluid, stable since prior. No worrisome lytic or sclerotic osseous abnormality. IMPRESSION: 1. No acute findings in the abdomen or pelvis. Specifically, no findings to explain the patient's history of abdominal distention. Oral contrast material has migrated through small bowel and colon to the level of the rectum. No retained capsule endoscope in the abdomen or pelvis. 2. Nodular liver contour suggests cirrhosis. 3. Subtle high attenuation material layering in the gallbladder lumen may be related to sludge or tiny stones. 4. Stable small right pleural effusion. 5. Aortic Atherosclerosis (ICD10-I70.0). Electronically Signed   By: Misty Stanley M.D.   On: 05/28/2020 09:44   DG Ribs Unilateral W/Chest Left  Result Date: 05/27/2020 CLINICAL DATA:  Fall, left rib pain EXAM: LEFT RIBS AND CHEST - 3+ VIEW COMPARISON:  Radiograph 03/01/2020 FINDINGS:  Pacer/defibrillator battery pack overlies the left chest wall. No displaced or other visible rib fractures are identified. No acute traumatic abnormalities of the chest wall are clearly evident on radiography. There is some increasing interstitial opacities in a perihilar and basilar predominance with mild vascular cuffing and fissural and septal thickening suggestive of at least mild mild interstitial pulmonary edema. Pulmonary vascular congestion and mild cardiomegaly are similar to priors. No pneumothorax or effusion. No acute osseous or soft tissue abnormality. Degenerative changes are present in the imaged spine and shoulders. IMPRESSION: 1. No visible displaced rib fractures or other acute traumatic abnormality of the chest wall 2. Features suggestive of CHF with pulmonary edema, cardiomegaly and vascular congestion. 3. No pneumothorax or effusion. Electronically Signed   By: Lovena Le M.D.   On: 05/27/2020 20:22   DG Abdomen 1 View  Result Date: 05/27/2020 CLINICAL DATA:  Capsule endoscopy 8 days prior, concern for retention EXAM: ABDOMEN - 1 VIEW COMPARISON:  Radiograph 05/27/2020, CT 06/19/2018 FINDINGS: Endoscopic capsule is not visualized over the abdomen or pelvis. Redemonstration of surgical clips in the low abdomen as well as heterogeneous calcification the pelvis compatible with calcified uterine fibroids. Few clustered air-filled loops of bowel in the left hemiabdomen are not significantly distended. No convincing residual high-grade obstructive pattern is seen. No acute osseous or other soft tissue abnormality. Degenerative changes in the spine, hips and pelvis. Some atelectatic changes in the bases. Terminus of a pacer/defibrillator device noted over the lower cardiomediastinal silhouette. IMPRESSION: 1. Endoscopic capsule is not visualized over the abdomen or pelvis. 2. Few clustered air-filled loops of bowel in the left hemiabdomen are not significantly distended, a nonspecific finding on  likely reflect high-grade obstruction though should correlate for abdominal symptoms. Electronically Signed   By: Lovena Le M.D.   On: 05/27/2020 20:02    Scheduled Meds: . amiodarone  100 mg Oral Daily  . carvedilol  3.125 mg Oral BID  . isosorbide-hydrALAZINE  1 tablet Oral TID  . levothyroxine  137 mcg Oral QAC breakfast  . pantoprazole (PROTONIX) IV  40 mg Intravenous Q12H  . [START ON 05/29/2020] spironolactone  12.5 mg Oral Daily  . [START ON 05/29/2020] torsemide  40 mg Oral Daily    Continuous Infusions:   LOS: 1 day     Kayleen Memos, MD [facility] Pager [phone]  If 7PM-7AM, please contact night-coverage [email or web address] Password [facility] 05/28/2020, 11:39 AM

## 2020-05-28 NOTE — H&P (View-Only) (Signed)
[facility] Consult: 8:26 AM 05/28/2020  LOS: 1 day    Referring Provider: Dr Irene Pap  Primary Care Physician:  Joylene Grapes MD. she has upcoming appointment to establish care with him.  Appointment is in March. Primary Gastroenterologist:  [name]     Reason for Consultation:  Recurrent, chronic gi blod loss anemia   HPI: Morgan Clay is a 75 y.o. female.  PMH compensated cirrhosis of the liver, trace esophageal varices.  Hep C eradicated with Harvoni. Chronic Eliquis for A. Fib.  Cardioversion 02/12/2020.  Non-ischemic CM, CHF, s/p pacemaker,  AICD.  Followed in Heart failure clinic.  CKD, stage 4/GFR mid 20s at baseline.  2020 sigmoid colectomy to address diverticulitis.  OSA not on CPAP.  Tested + for Covid 19 on 05/03/2020, completed isolation.     Hx of GI bleeding and anemia, multiple work-ups in the past.  Evaluation of Hgb 6.9, dark stools during admission in late 02/2020. 2 PRBCs, Feraheme in 02/2020. 2 PRBCs 12/21 for Hgb 7.7, ongoing dark stools.     10/26/2017 EGD:  For melena.  Benign, nonobstructing esophageal stenosis. Solitary, nonbleeding AVM in stomach treated with APC.  Normal duodenum to D2 11/07/2017 EGD: For melena a week and a half following EGD w APC of gastric AVM.  Small ulcer at site of recently APCd AVM, likley source for bleeding 11/08/2017 colonoscopy: Pandiverticulosis.  4 mm polyp removed from transverse colon.  Suspect anemia related to the gastric AVM treated 2 weeks prior. 12/2017 capsule endoscopy: Complete study with fair prep.  1 medium to large, nonbleeding AVM at 2 hours 56 minutes is 1 hour and 30 minutes beyond first duodenal image.  Otherwise negative study.  This AVM not reachable by enteroscopy and if further active bleeding would need referral for deep  enteroscopy 10/ 26/2021  small bowel enteroscopy showed trace column esophageal varix w/o stigmata or bleeding.  Superficial AVMs and punctate lesions in the prepylorus were obliterated with APC.  Some scope trauma in the stomach noted.  Deviously placed tattoo at the extent of prior enteroscopy noted.  Dr. Arelia Longest suspected bleeding and anemia from the gastric findings but more distal, nonvisualized, distal small bowel AVMs could also be a source. Recently tested positive for COVID-19 as of 12/27.  This resulted in postponement of planned capsule endoscopy. 05/19/20 VCE.  Non-diagnostic as capsule never left the stomach.  Recently experiencing multiple falls, leg weakness.  Had a brief syncopal spell yesterday.  "Jet black" stools, occasionally may see more of a burgundy color stool.[name]  Has yet to visualize passed capsule. Hgb 5.1 yesterday, advised to go to the emergency department. 05/27/2020 KUB with no evidence for retained capsule or bowel obstruction.   Hgb this a.m. 6.8 after 1 PRBC.  Overall improved GFR c/w 03/2020 but still CKD stage 4.  BUN stable in mid 60s.  INR 2.1.   Last Eliquis was 05/27/20, now held.    Past Medical History:  Diagnosis Date  . A-fib ([place])    on Eliquis  . AICD (automatic cardioverter/defibrillator) present 10/06/2015  .  Anemia   . ARF (acute renal failure) ([place])   . Arthritis    "qwhere" (01/03/2018)  . Asthma    reports mild asthma since childhood - had COPD on dx list from prior PCP  . Chronic bronchitis ([place])    "get it most years; not this past year though" (01/03/2018)  . Chronic kidney disease    "? stage;  followed by [facility]; [name]" (01/03/2018)  . Chronic systolic congestive heart failure ([place])   . COVID-19 05/03/2020  . DEPRESSION 12/17/2009   Annotation: PHQ-9 score = 14 done on 12/17/2009 Qualifier: Diagnosis of  By: Hassell Done FNP, Tori Milks    . Diabetes mellitus without complication (HCC)    DIET CONTROLLED   . Diverticulosis   .  Enteritis   . FIBROIDS, UTERUS 03/05/2008  . GI bleeding 01/03/2018  . Glaucoma   . Gout   . Hepatitis C    HEPATITIS C - s/p treatment with Harvoni, saw hepatology, Dawn Drazek  . History of blood transfusion ~ 11/2017  . Hyperlipemia 12/06/2012  . HYPERTENSION, BENIGN 04/24/2007  . Hypothyroidism   . LBBB (left bundle branch block)   . Lower GI bleeding    "been dealing w/it since 07/2017" (01/03/2018)  . Nonischemic cardiomyopathy ([place])   . OBESITY 05/27/2009  . OBSTRUCTIVE SLEEP APNEA 11/14/2007   no CPAP  . OSTEOPENIA 09/30/2008  . Pain and swelling of left upper extremity 08/08/2017  . Personal history of other infectious and parasitic disease    Hepatitis B  . Pneumonia    "several times" (01/03/2018)  . Pulmonary edema, acute ([place]) 08/09/2017    Past Surgical History:  Procedure Laterality Date  . APPENDECTOMY    . CARDIAC CATHETERIZATION    . CARDIOVERSION N/A 12/14/2014   Procedure: CARDIOVERSION;  Surgeon: Dorothy Spark, MD;  Location: [facility] ENDOSCOPY;  Service: Cardiovascular;  Laterality: N/A;  . CARDIOVERSION N/A 02/26/2017   Procedure: CARDIOVERSION;  Surgeon: Evans Lance, MD;  Location: [place] CV LAB;  Service: Cardiovascular;  Laterality: N/A;  . CARDIOVERSION N/A 07/24/2017   Procedure: CARDIOVERSION;  Surgeon: Thayer Headings, MD;  Location: [facility] ENDOSCOPY;  Service: Cardiovascular;  Laterality: N/A;  . CARDIOVERSION N/A 02/12/2020   Procedure: CARDIOVERSION;  Surgeon: [name], MD;  Location: [facility] ENDOSCOPY;  Service: Cardiovascular;  Laterality: N/A;  . COLONOSCOPY N/A 11/08/2017   Procedure: COLONOSCOPY;  Surgeon: Milus Banister, MD;  Location: [facility] ENDOSCOPY;  Service: Endoscopy;  Laterality: N/A;  . DILATION AND CURETTAGE OF UTERUS    . ENTEROSCOPY N/A 03/02/2020   Procedure: ENTEROSCOPY;  Surgeon: Yetta Flock, MD;  Location: [facility] ENDOSCOPY;  Service: Gastroenterology;  Laterality: N/A;  . EP IMPLANTABLE DEVICE N/A 10/06/2015   Procedure: BIV  ICD Generator Changeout;  Surgeon: [name], MD;  Location: [place] CV LAB;  Service: Cardiovascular;  Laterality: N/A;  . ESOPHAGOGASTRODUODENOSCOPY N/A 10/26/2017   Procedure: ESOPHAGOGASTRODUODENOSCOPY (EGD);  Surgeon: Ladene Artist, MD;  Location: [facility] ENDOSCOPY;  Service: Endoscopy;  Laterality: N/A;  . ESOPHAGOGASTRODUODENOSCOPY (EGD) WITH PROPOFOL N/A 11/07/2017   Procedure: ESOPHAGOGASTRODUODENOSCOPY (EGD) WITH PROPOFOL;  Surgeon: Milus Banister, MD;  Location: [facility] ENDOSCOPY;  Service: Endoscopy;  Laterality: N/A;  . GIVENS CAPSULE STUDY N/A 05/19/2020   Procedure: GIVENS CAPSULE STUDY;  Surgeon: [name], MD;  Location: [place];  Service: Endoscopy;  Laterality: N/A;  .adm for obs since pacemaker, PA wil enter order and see pt  . HOT HEMOSTASIS N/A 10/26/2017   Procedure:  HOT HEMOSTASIS (ARGON PLASMA COAGULATION/BICAP);  Surgeon: Ladene Artist, MD;  Location: [facility] ENDOSCOPY;  Service: Endoscopy;  Laterality: N/A;  . HOT HEMOSTASIS N/A 03/02/2020   Procedure: HOT HEMOSTASIS (ARGON PLASMA COAGULATION/BICAP);  Surgeon: Yetta Flock, MD;  Location[facility] ENDOSCOPY;  Service: Gastroenterology;  Laterality: N/A;  . INSERT / REPLACE / REMOVE PACEMAKER  ?2008  . POLYPECTOMY  11/08/2017   Procedure: POLYPECTOMY;  Surgeon: Milus Banister, MD;  Location: [facility] ENDOSCOPY;  Service: Endoscopy;;  . PROCTOSCOPY N/A 05/22/2018   Procedure: RIGID PROCTOSCOPY;  Surgeon: Michael Boston, MD;  Location: [facility] ORS;  Service: General;  Laterality: N/A;  . RIGHT HEART CATH N/A 02/13/2018   Procedure: RIGHT HEART CATH;  Surgeon: [name], MD;  Location: [place] CV LAB;  Service: Cardiovascular;  Laterality: N/A;  . RIGHT/LEFT HEART CATH AND CORONARY ANGIOGRAPHY N/A 12/11/2019   Procedure: RIGHT/LEFT HEART CATH AND CORONARY ANGIOGRAPHY;  Surgeon: [name], MD;  Location: [place] CV LAB;  Service: Cardiovascular;  Laterality: N/A;  . SUBMUCOSAL TATTOO INJECTION  03/02/2020    Procedure: SUBMUCOSAL TATTOO INJECTION;  Surgeon: Yetta Flock, MD;  Location: [facility] ENDOSCOPY;  Service: Gastroenterology;;  . TUBAL LIGATION    . XI ROBOTIC ASSISTED LOWER ANTERIOR RESECTION N/A 05/22/2018   Procedure: XI ROBOTIC ASSISTED SIGMOID COLOECTOMY MOBILIZATION OF SPLENIC FLEXURE, FIREFLY ASSESSMENT OF PERFUSION;  Surgeon: Michael Boston, MD;  Location: [facility] ORS;  Service: General;  Laterality: N/A;  ERAS PATHWAY    Prior to Admission medications   Medication Sig Start Date End Date Taking? Authorizing Provider  allopurinol (ZYLOPRIM) 300 MG tablet Take 300 mg by mouth daily.    Yes [provider]  amiodarone (PACERONE) 200 MG tablet Take 1 tablet (200 mg total) by mouth daily. Patient taking differently: Take 200 mg by mouth 2 (two) times daily. 03/15/20  Yes Fenton, Clint R, PA  apixaban (ELIQUIS) 5 MG TABS tablet Take 1 tablet (5 mg total) by mouth 2 (two) times daily. 12/12/19  Yes [name], MD  carvedilol (COREG) 25 MG tablet Take 1 tablet by mouth twice daily 05/13/20  Yes [name], MD  colchicine 0.6 MG tablet Take 0.6 mg by mouth daily. 02/06/18  Yes [provider]  ferrous sulfate 325 (65 FE) MG tablet Take 1 tablet (325 mg total) by mouth daily. 03/04/20 03/04/21 Yes [place] Eric J, DO  gabapentin (NEURONTIN) 300 MG capsule Take 1 capsule (300 mg total) by mouth 2 (two) times daily. 07/17/19  Yes Billie Ruddy, MD  isosorbide-hydrALAZINE (BIDIL) 20-37.5 MG tablet Take 1 tablet by mouth 3 (three) times daily. 12/05/19  Yes [name], MD  levothyroxine (SYNTHROID) 137 MCG tablet Take 1 tablet (137 mcg total) by mouth daily before breakfast. 03/11/20  Yes Billie Ruddy, MD  nitroGLYCERIN (NITROSTAT) 0.4 MG SL tablet Place 0.4 mg under the tongue every 5 (five) minutes x 3 doses as needed for chest pain.    Yes [provider]  pantoprazole (PROTONIX) 40 MG tablet Take 1 tablet (40 mg total) by mouth daily. 04/06/20 04/06/21 Yes  Clegg, [name] D, NP  spironolactone (ALDACTONE) 25 MG tablet Take 0.5 tablets (12.5 mg total) by mouth daily. 12/05/19 03/23/22 Yes [name], MD  tiZANidine (ZANAFLEX) 2 MG tablet Take 1 tablet (2 mg total) by mouth at bedtime as needed for muscle spasms. 04/07/20  Yes Billie Ruddy, MD  torsemide (DEMADEX) 20 MG tablet Take 2 tablets (40 mg total) by  mouth daily. 05/24/20 08/22/20 Yes [name], MD  traZODone (DESYREL) 50 MG tablet Take 50 mg by mouth at bedtime.   Yes [provider]    Scheduled Meds: . amiodarone  100 mg Oral Daily  . carvedilol  3.125 mg Oral BID  . isosorbide-hydrALAZINE  1 tablet Oral TID  . levothyroxine  137 mcg Oral QAC breakfast  . pantoprazole (PROTONIX) IV  40 mg Intravenous Q12H  . [START ON 05/29/2020] spironolactone  12.5 mg Oral Daily  . [START ON 05/29/2020] torsemide  40 mg Oral Daily   Infusions:  PRN Meds: acetaminophen **OR** acetaminophen   Allergies as of 05/27/2020  . (No Known Allergies)    Family History  Problem Relation Age of Onset  . Asthma Father   . Heart attack Father   . Asthma Sister   . Lung cancer Sister   . Heart attack Mother   . Stroke Brother   . Colon cancer Neg Hx   . Esophageal cancer Neg Hx   . Pancreatic cancer Neg Hx   . Stomach cancer Neg Hx   . Liver disease Neg Hx     Social History   Socioeconomic History  . Marital status: Widowed    Spouse name: Not on file  . Number of children: 1  . Years of education: 69  . Highest education level: Not on file  Occupational History  . Not on file  Tobacco Use  . Smoking status: Never Smoker  . Smokeless tobacco: Never Used  Vaping Use  . Vaping Use: Never used  Substance and Sexual Activity  . Alcohol use: Yes    Comment: 01/03/2018 "couple drinks/month"  . Drug use: Yes    Types: Marijuana    Comment: VERY INTERMITTENT   . Sexual activity: Not Currently  Other Topics Concern  . Not on file  Social History Narrative   Work or  School: retiring from [employer]      Home Situation: lives alone      Spiritual Beliefs: Christian - Baptist      Lifestyle: no regular exercise; poor diet      She is divorced at least one adult child   Never smoker    rare alcohol to occasional at best    no substance abuse         Social Determinants of Radio broadcast assistant Strain: Not on file  Food Insecurity: Not on file  Transportation Needs: Not on file  Physical Activity: Not on file  Stress: Not on file  Social Connections: Not on file  Intimate Partner Violence: Not on file    REVIEW OF SYSTEMS: Constitutional: Found weakness, fatigue ENT:  No nose bleeds Pulm: No acute shortness of breath CV:  No palpitations, no LE edema.  No angina GU:  No hematuria, no frequency GI: See HPI.  No nausea, vomiting.  Occasional belly discomfort but no severe pain Heme: Other than the dark stools, she doesn't have any unusual tendency to bleed or bruise. Transfusions: See HPI  Neuro:  No headaches, no peripheral tingling or numbness.  Brief syncope at home. Derm:  No itching, no rash or sores.  Endocrine:  No sweats or chills.  No polyuria or dysuria Immunization: Not queried. Travel:  None beyond local counties in last few months.    PHYSICAL EXAM: Vital signs in last 24 hours: Vitals:   05/27/20 2335 05/28/20 0047  BP: (!) 118/43 (!) 155/60  Pulse:  65  Resp:  17 17  Temp: 98.4 F (36.9 C) 98.2 F (36.8 C)  SpO2: 100% 100%   Wt Readings from Last 3 Encounters:  05/27/20 63.5 kg  05/19/20 63.5 kg  04/15/20 64.5 kg    General: Patient is pleasant, alert, calm.  Looks somewhat chronically ill but in no distress and not acutely ill. Head: No facial asymmetry or swelling.  No signs of head trauma Eyes: No scleral icterus.  Conjunctiva pale. Ears: Not hard of hearing Nose: No discharge Mouth: Moist, pink, clear oral mucosa.  Tongue midline. Neck: No JVD, masses, thyromegaly Lungs: Clear bilaterally.  No  labored breathing, no cough Heart: Regular, paced rhythm, rate 60 on telemetry monitor.  Soft systolic murmur.  Pacer/AICD upper chest wall Abdomen: Soft, nontender, nondistended..   Rectal: Stool is burgundy, soft. Musc/Skeltl: No joint redness or swelling. Extremities: No CCE Neurologic: Oriented x3.  Good historian.  Moves all 4 limbs without tremor, strength not tested Skin: No sores, rashes or suspicious lesions Nodes: No cervical adenopathy Psych: Calm, pleasant, in good spirits, fluid speech.  Intake/Output from previous day: 01/20 0701 - 01/21 0700 In: 371 [Blood:321; IV Piggyback:50] Out: -  Intake/Output this shift: No intake/output data recorded.  LAB RESULTS: Recent Labs    05/27/20 1038 05/27/20 1357 05/28/20 0230  WBC 6.4 7.1 6.4  HGB 5.1 cL* 5.2* 6.8*  HCT 15.7 cL* 16.8* 20.3*  PLT 191.0 217 171   BMET Lab Results  Component Value Date   NA 139 05/28/2020   NA 137 05/27/2020   NA 138 05/27/2020   K 4.0 05/28/2020   K 4.5 05/27/2020   K 4.5 05/27/2020   CL 107 05/28/2020   CL 104 05/27/2020   CL 106 05/27/2020   CO2 20 (L) 05/28/2020   CO2 20 (L) 05/27/2020   CO2 23 05/27/2020   GLUCOSE 86 05/28/2020   GLUCOSE 120 (H) 05/27/2020   GLUCOSE 105 (H) 05/27/2020   BUN 65 (H) 05/28/2020   BUN 66 (H) 05/27/2020   BUN 67 (H) 05/27/2020   CREATININE 2.90 (H) 05/28/2020   CREATININE 2.89 (H) 05/27/2020   CREATININE 2.78 (H) 05/27/2020   CALCIUM 8.7 (L) 05/28/2020   CALCIUM 9.0 05/27/2020   CALCIUM 9.0 05/27/2020   LFT Recent Labs    05/27/20 1357  PROT 7.0  ALBUMIN 3.6  AST 18  ALT 14  ALKPHOS 75  BILITOT 0.7   PT/INR Lab Results  Component Value Date   INR 2.1 (H) 05/27/2020   INR 1.3 (H) 12/05/2019   INR 1.26 02/08/2018   Hepatitis Panel No results for input(s): HEPBSAG, HCVAB, HEPAIGM, HEPBIGM in the last 72 hours. C-Diff No components found for: CDIFF Lipase     Component Value Date/Time   LIPASE 39 02/27/2018 1730    Drugs  of Abuse     Component Value Date/Time   LABOPIA NONE DETECTED 10/02/2015 0329   COCAINSCRNUR NONE DETECTED 10/02/2015 0329   LABBENZ NONE DETECTED 10/02/2015 0329   AMPHETMU NONE DETECTED 10/02/2015 0329   THCU POSITIVE (A) 10/02/2015 0329   LABBARB NONE DETECTED 10/02/2015 0329     RADIOLOGY STUDIES: DG Ribs Unilateral W/Chest Left  Result Date: 05/27/2020 CLINICAL DATA:  Fall, left rib pain EXAM: LEFT RIBS AND CHEST - 3+ VIEW COMPARISON:  Radiograph 03/01/2020 FINDINGS: Pacer/defibrillator battery pack overlies the left chest wall. No displaced or other visible rib fractures are identified. No acute traumatic abnormalities of the chest wall are clearly evident on radiography. There is some increasing interstitial  opacities in a perihilar and basilar predominance with mild vascular cuffing and fissural and septal thickening suggestive of at least mild mild interstitial pulmonary edema. Pulmonary vascular congestion and mild cardiomegaly are similar to priors. No pneumothorax or effusion. No acute osseous or soft tissue abnormality. Degenerative changes are present in the imaged spine and shoulders. IMPRESSION: 1. No visible displaced rib fractures or other acute traumatic abnormality of the chest wall 2. Features suggestive of CHF with pulmonary edema, cardiomegaly and vascular congestion. 3. No pneumothorax or effusion. Electronically Signed   By: Lovena Le M.D.   On: 05/27/2020 20:22   DG Abdomen 1 View  Result Date: 05/27/2020 CLINICAL DATA:  Capsule endoscopy 8 days prior, concern for retention EXAM: ABDOMEN - 1 VIEW COMPARISON:  Radiograph 05/27/2020, CT 06/19/2018 FINDINGS: Endoscopic capsule is not visualized over the abdomen or pelvis. Redemonstration of surgical clips in the low abdomen as well as heterogeneous calcification the pelvis compatible with calcified uterine fibroids. Few clustered air-filled loops of bowel in the left hemiabdomen are not significantly distended. No  convincing residual high-grade obstructive pattern is seen. No acute osseous or other soft tissue abnormality. Degenerative changes in the spine, hips and pelvis. Some atelectatic changes in the bases. Terminus of a pacer/defibrillator device noted over the lower cardiomediastinal silhouette. IMPRESSION: 1. Endoscopic capsule is not visualized over the abdomen or pelvis. 2. Few clustered air-filled loops of bowel in the left hemiabdomen are not significantly distended, a nonspecific finding on likely reflect high-grade obstruction though should correlate for abdominal symptoms. Electronically Signed   By: Lovena Le M.D.   On: 05/27/2020 20:02   DG Abd 1 View  Result Date: 05/27/2020 CLINICAL DATA:  Evaluate for retained capsule.  Asymptomatic. EXAM: ABDOMEN - 1 VIEW COMPARISON:  08/11/2017 FINDINGS: Single supine view of the abdomen and pelvis. Surgical clips in the upper pelvis. No gaseous distention of bowel loops. Calcified uterine fibroid is eccentric left. No evidence of metallic retained capsule. IMPRESSION: No evidence of retained capsule or bowel obstruction. Electronically Signed   By: [name] M.D.   On: 05/27/2020 16:06     IMPRESSION:   *   Recurrent, acute on chronic GI blood loss anemia. Several studies within the past year noted above.  Most recently failed attempt at VCE 05/19/20 as capsule never left the stomach.  By yesterday's KUB the capsule is not visualized though the patient has not seen it emerged in her stools.  Thus far source of bleeding is gastric AVMs, suspect SB AVMs as well  *    Ischemic cardiomyopathy.  Chronic Eliquis.  Last dose 1/20, yesterday  *    Stage 4 CKD.  *    Compensated cirrhosis.  Eradicated hep C.  *    COVID-19 positive 12/27, completed quarantine/isolation   PLAN:     *   Per Dr Loletha Carrow, will see patient later today  *    Given that this GI bleeding is from AVMs, she did doesn't need to have a clear diet so I advanced her to heart  healthy diet.  In event of pursuing another enteroscopy or placement of capsule into the small bowel, can convert her over to clears but this is not planned for today.  *   May need to reconsider the use of Eliquis.  ? Ask cards to weigh in on [facility] going forward?   *  ?  Transfuse 1 more PRBC?   [name]  05/28/2020, 8:26 AM Phone [phone]  I have reviewed the entire case in detail with the above APP and discussed the plan in detail.  Therefore, I agree with the diagnoses recorded above. In addition,  I have personally interviewed and examined the patient and have personally reviewed any abdominal/pelvic CT scan images.  My additional thoughts are as follows:  Acute on chronic anemia of GI blood loss due to suspected small bowel AVMs. Known history of gastric AVMs. Recurrent overt bleeding this admission leading to significant drop in hemoglobin below baseline that is definitely symptomatic. Chronic OAC for A. fib, also CKD and CHF.  Recent video capsule study nondiagnostic, retained in the stomach.  Plan is to transfuse 1 unit PRBCs. I communicated with the [facility] physician who was also plan to do so and will place the orders.  I recommended the patient undergo upper endoscopy with placement of a video capsule study into the small bowel. She was agreeable after discussion of procedure and risks.  The benefits and risks of the planned procedure were described in detail with the patient or (when appropriate) their health care proxy.  Risks were outlined as including, but not limited to, bleeding, infection, perforation, adverse medication reaction leading to cardiac or pulmonary decompensation, pancreatitis (if ERCP).  The limitation of incomplete mucosal visualization was also discussed.  No guarantees or warranties were given.  Patient at increased risk for cardiopulmonary complications of procedure due to medical comorbidities.  Eliquis will be washed out by tomorrow to make that  procedure feasible, however we are still working on the endoscopy/anesthesia schedule to see if that will allow the procedure tomorrow or the next day.  I explained all this to the patient and she understands the complexity of the situation and scheduling. I will put her back on a clear liquid diet for now and n.p.o. after midnight. We will let her know later today if the definite plan is for EGD with capsule placement tomorrow. If so, 1 L of MoviPrep will be given this evening. She is also currently on a monitored bed, and she will need to remain on that for the duration of her video capsule study because she has a pacer/AICD, and there is the low likelihood of the capsule device interfering with her cardiac device.  This was known to be the case for her recent study, which is why it was performed in the hospital outpatient setting.  CBC in AM.  If video capsule study reveals AVMs that are beyond the reach of endoscopic management, then a discussion with cardiology will be warranted regarding consideration of permanent OAC discontinuation (? Patient candidate for a watchman device).  [name] [phone]

## 2020-05-29 ENCOUNTER — Inpatient Hospital Stay (HOSPITAL_COMMUNITY): Payer: Medicare Other | Admitting: Anesthesiology

## 2020-05-29 ENCOUNTER — Encounter (HOSPITAL_COMMUNITY)
Admission: EM | Disposition: A | Discharge status: Home / Self Care | Payer: Self-pay | Source: Home / Self Care | Attending: Internal Medicine

## 2020-05-29 ENCOUNTER — Encounter (HOSPITAL_COMMUNITY): Payer: Self-pay | Admitting: Internal Medicine

## 2020-05-29 DIAGNOSIS — K921 Melena: Secondary | ICD-10-CM | POA: Diagnosis not present

## 2020-05-29 DIAGNOSIS — D62 Acute posthemorrhagic anemia: Secondary | ICD-10-CM | POA: Diagnosis not present

## 2020-05-29 HISTORY — PX: ESOPHAGOGASTRODUODENOSCOPY (EGD) WITH PROPOFOL: SHX5813

## 2020-05-29 HISTORY — PX: GIVENS CAPSULE STUDY: SHX5432

## 2020-05-29 LAB — BPAM RBC
Blood Product Expiration Date: 202202222359
Blood Product Expiration Date: 202202242359
ISSUE DATE / TIME: 202201202013
ISSUE DATE / TIME: 202201211336
Unit Type and Rh: 5100
Unit Type and Rh: 5100

## 2020-05-29 LAB — TYPE AND SCREEN
ABO/RH(D): O POS
Antibody Screen: NEGATIVE
Unit division: 0
Unit division: 0

## 2020-05-29 LAB — GLUCOSE, CAPILLARY
Glucose-Capillary: 126 mg/dL — ABNORMAL HIGH (ref 70–99)
Glucose-Capillary: 99 mg/dL (ref 70–99)

## 2020-05-29 LAB — MAGNESIUM: Magnesium: 2.4 mg/dL (ref 1.7–2.4)

## 2020-05-29 LAB — BASIC METABOLIC PANEL
Anion gap: 11 (ref 5–15)
BUN: 63 mg/dL — ABNORMAL HIGH (ref 8–23)
CO2: 20 mmol/L — ABNORMAL LOW (ref 22–32)
Calcium: 8.7 mg/dL — ABNORMAL LOW (ref 8.9–10.3)
Chloride: 103 mmol/L (ref 98–111)
Creatinine, Ser: 2.79 mg/dL — ABNORMAL HIGH (ref 0.44–1.00)
GFR, Estimated: 17 mL/min — ABNORMAL LOW (ref >60)
Glucose, Bld: 107 mg/dL — ABNORMAL HIGH (ref 70–99)
Potassium: 3.7 mmol/L (ref 3.5–5.1)
Sodium: 134 mmol/L — ABNORMAL LOW (ref 135–145)

## 2020-05-29 LAB — CBC
HCT: 24.6 % — ABNORMAL LOW (ref 36.0–46.0)
Hemoglobin: 8.2 g/dL — ABNORMAL LOW (ref 12.0–15.0)
MCH: 30.1 pg (ref 26.0–34.0)
MCHC: 33.3 g/dL (ref 30.0–36.0)
MCV: 90.4 fL (ref 80.0–100.0)
Platelets: 171 10*3/uL (ref 150–400)
RBC: 2.72 MIL/uL — ABNORMAL LOW (ref 3.87–5.11)
RDW: 17.9 % — ABNORMAL HIGH (ref 11.5–15.5)
WBC: 6 10*3/uL (ref 4.0–10.5)
nRBC: 0 % (ref 0.0–0.2)

## 2020-05-29 SURGERY — ESOPHAGOGASTRODUODENOSCOPY (EGD) WITH PROPOFOL
Anesthesia: Monitor Anesthesia Care

## 2020-05-29 MED ORDER — OXYCODONE HCL 5 MG PO TABS
5.0000 mg | ORAL_TABLET | ORAL | Status: DC | PRN
  Administered 2020-05-29 – 2020-06-01 (×7): 5 mg via ORAL
  Filled 2020-05-29 (×8): qty 1

## 2020-05-29 MED ORDER — SODIUM CHLORIDE 0.9 % IV SOLN: INTRAVENOUS | Status: DC

## 2020-05-29 MED ORDER — PROPOFOL 500 MG/50ML IV EMUL
INTRAVENOUS | Status: DC | PRN
  Administered 2020-05-29: 100 ug/kg/min via INTRAVENOUS

## 2020-05-29 MED ORDER — HYDROMORPHONE HCL 1 MG/ML IJ SOLN: 0.5000 mg | INTRAMUSCULAR | Status: DC | PRN

## 2020-05-29 MED ORDER — PROPOFOL 10 MG/ML IV BOLUS
INTRAVENOUS | Status: DC | PRN
  Administered 2020-05-29 (×2): 20 mg via INTRAVENOUS

## 2020-05-29 SURGICAL SUPPLY — 15 items

## 2020-05-29 NOTE — Interval H&P Note (Signed)
History and Physical Interval Note:  05/29/2020 10:00 AM  Morgan Clay  has presented today for surgery, with the diagnosis of melena and anemia.  The various methods of treatment have been discussed with the patient and family. After consideration of risks, benefits and other options for treatment, the patient has consented to  Procedure(s): ESOPHAGOGASTRODUODENOSCOPY (EGD) WITH PROPOFOL (N/A) as a surgical intervention.  The patient's history has been reviewed, patient examined, no change in status, stable for surgery.  I have reviewed the patient's chart and labs.  Questions were answered to the patient's satisfaction.    Hgb 8.2 today  [name]

## 2020-05-29 NOTE — Plan of Care (Signed)

## 2020-05-29 NOTE — Anesthesia Postprocedure Evaluation (Signed)
Anesthesia Post Note  Patient: Morgan Clay  Procedure(s) Performed: ESOPHAGOGASTRODUODENOSCOPY (EGD) WITH PROPOFOL (N/A ) GIVENS CAPSULE STUDY (N/A )     Patient location during evaluation: PACU Anesthesia Type: MAC Level of consciousness: awake and alert Pain management: pain level controlled Vital Signs Assessment: post-procedure vital signs reviewed and stable Respiratory status: spontaneous breathing, nonlabored ventilation and respiratory function stable Cardiovascular status: stable and blood pressure returned to baseline Anesthetic complications: no   No complications documented.  Last Vitals:  Vitals:   05/29/20 1051 05/29/20 1137  BP: 126/86 (!) 151/50  Pulse: (!) 58 60  Resp: 17 17  Temp:  (!) 36.4 C  SpO2: 100% 100%                 Audry Pili

## 2020-05-29 NOTE — Transfer of Care (Signed)
Immediate Anesthesia Transfer of Care Note  Patient: Morgan Clay  Procedure(s) Performed: ESOPHAGOGASTRODUODENOSCOPY (EGD) WITH PROPOFOL (N/A ) GIVENS CAPSULE STUDY (N/A )  Patient Location: Endoscopy Unit  Anesthesia Type:MAC  Level of Consciousness: drowsy  Airway & Oxygen Therapy: Patient Spontanous Breathing  Post-op Assessment: Report given to RN and Post -op Vital signs reviewed and stable  Post vital signs: Reviewed and stable  Last Vitals:  Vitals Value Taken Time  BP 141/60 05/29/20 1041  Temp    Pulse    Resp    SpO2    Vitals shown include unvalidated device data.  Last Pain:  Vitals:   05/29/20 1000  TempSrc: Oral  PainSc: 0-No pain         Complications: No complications documented.

## 2020-05-29 NOTE — Op Note (Signed)
[facility] Patient Name: Morgan Clay Procedure Date : 05/29/2020 MRN: [id number] Attending MD: Estill Cotta. Loletha Carrow , MD Date of Birth: 11-29-1945 CSN: [id number] Age: 75 Admit Type: Inpatient Procedure:                Upper GI endoscopy Indications:              Acute post hemorrhagic anemia, Melena (recent VCE                            retained in stomach) Providers:                Estill Cotta. Loletha Carrow, MD, Laverda Sorenson, Technician,                            Clearnce Sorrel, CRNA, Josie Dixon, RN Referring MD:             [facility] Medicines:                Monitored Anesthesia Care Complications:            No immediate complications. Estimated Blood Loss:     Estimated blood loss: none. Procedure:                Pre-Anesthesia Assessment:                           - Prior to the procedure, a History and Physical                            was performed, and patient medications and                            allergies were reviewed. The patient's tolerance of                            previous anesthesia was also reviewed. The risks                            and benefits of the procedure and the sedation                            options and risks were discussed with the patient.                            All questions were answered, and informed consent                            was obtained. Prior Anticoagulants: The patient has                            taken Eliquis (apixaban), last dose was 2 days                            prior to procedure. ASA Grade Assessment: III - A  patient with severe systemic disease. After                            reviewing the risks and benefits, the patient was                            deemed in satisfactory condition to undergo the                            procedure.                           After obtaining informed consent, the endoscope was                            passed under  direct vision. Throughout the                            procedure, the patient's blood pressure, pulse, and                            oxygen saturations were monitored continuously. The                            GIF-H190 ([id number]) Olympus gastroscope was                            introduced through the mouth, and advanced to the                            second part of duodenum. The upper GI endoscopy was                            accomplished without difficulty. The patient                            tolerated the procedure well. Scope In: Scope Out: Findings:      The esophagus was normal.      The stomach was normal.      The examined duodenum was normal. Using the endoscope, the video capsule       enteroscope was advanced into the duodenal bulb. Repeat EGD confirmed       capsule passage to second portion of duodenum. Impression:               - Normal esophagus.                           - Normal stomach.                           - Normal examined duodenum.                           - Successful completion of the Video Capsule  Enteroscope placement.                           - No specimens collected. Moderate Sedation:      MAC sedation used Recommendation:           - Return patient to hospital ward for ongoing care.                           - Clear liquid diet for 2 hours.                           - Continue present medications.                           - VCE will be read tomorrow.                           - CBC tomorrow Procedure Code(s):        --- Professional ---                           [id number], Esophagogastroduodenoscopy, flexible,                            transoral; diagnostic, including collection of                            specimen(s) by brushing or washing, when performed                            (separate procedure) Diagnosis Code(s):        --- Professional ---                           D62, Acute posthemorrhagic  anemia                           K92.1, Melena (includes Hematochezia) CPT copyright 2019 American Medical Association. All rights reserved. The codes documented in this report are preliminary and upon coder review may  be revised to meet current compliance requirements. Shallon Yaklin L. Loletha Carrow, MD 05/29/2020 10:42:38 AM This report has been signed electronically. Number of Addenda: 0

## 2020-05-29 NOTE — Progress Notes (Signed)
Endoscopy capsule monitor removed at 2300. Placed outside of patient room for endo RN to pick up in AM.

## 2020-05-29 NOTE — Progress Notes (Signed)
Reviewed capsule instructions with patient prior to procedure.  Pt to progress diet throughout day.  Pt verbalized understanding.  Capsule deployed at 10:30am.  5N RN to remove leads/monitor at 10:30pm and place in bag outside patient room.  Endo RN will pick up monitor on morning of 1/23.  RN verbalized understanding.

## 2020-05-29 NOTE — Evaluation (Signed)
Physical Therapy Evaluation Patient Details Name: Morgan Clay MRN: [id number] DOB: 12/28/1945 Today's Date: 05/29/2020   History of Present Illness  Patient is a 75 y/o female with PMH of compensated cirrhosis of liver, chronic anemia and GI bleed, CHF, DM II, CKD, chronic Afib. Presented to ED with B LE weakness with multiple falls and Hgb of 5.0  Clinical Impression  PTA, patient lives with daughter and reports modI for mobility with increased time required for all tasks. Patient supervision for bed mobility and min guard for sit to stand transfer. Patient with very slow, steady pace for ambulation with HHAx1 and min guard. Recommended to patient about use of rollator for energy conservation as patient will be able to take seated rest breaks during tasks, patient agreeable and states she has one. Patient presents with generalized weakness, impaired balance, decreased activity tolerance. Patient will benefit from skilled PT services during acute stay to address listed deficits. Recommend HHPT following discharge to maximize functional mobility and independence.     Follow Up Recommendations Home health PT;Supervision for mobility/OOB    Equipment Recommendations  3in1 (PT)    Recommendations for Other Services       Precautions / Restrictions Precautions Precautions: Fall Restrictions Weight Bearing Restrictions: No      Mobility  Bed Mobility Overal bed mobility: Needs Assistance Bed Mobility: Supine to Sit;Sit to Supine     Supine to sit: Supervision Sit to supine: Supervision   General bed mobility comments: supervision for safety    Transfers Overall transfer level: Needs assistance   Transfers: Sit to/from Stand Sit to Stand: Min guard         General transfer comment: min guard for safety with very slow movement  Ambulation/Gait Ambulation/Gait assistance: Min guard Gait Distance (Feet): 15 Feet Assistive device: 1 person hand held assist Gait  Pattern/deviations: Step-to pattern;Decreased stride length;Wide base of support Gait velocity: decreased   General Gait Details: very slow steady pace, refused RW at time so HHAx1. no LOB noted  Stairs            Wheelchair Mobility    Modified Rankin (Stroke Patients Only)       Balance Overall balance assessment: Needs assistance Sitting-balance support: No upper extremity supported;Feet supported Sitting balance-Leahy Scale: Good     Standing balance support: Single extremity supported;During functional activity Standing balance-Leahy Scale: Poor Standing balance comment: reliant on UE support                             Pertinent Vitals/Pain Pain Assessment: 0-10 Pain Score: 8  Pain Location: L hip Pain Descriptors / Indicators: Aching;Grimacing;Guarding Pain Intervention(s): Monitored during session    Home Living Family/patient expects to be discharged to:: Private residence Living Arrangements: Children Available Help at Discharge: Family;Available PRN/intermittently Type of Home: House Home Access: Level entry     Home Layout: Two level;Able to live on main level with bedroom/bathroom (lives on 1st floor) Home Equipment: Morgan Clay - 2 wheels;Morgan Clay - 4 wheels      Prior Function Level of Independence: Independent         Comments: reports that it takes increased time to perform tasks     Hand Dominance        Extremity/Trunk Assessment   Upper Extremity Assessment Upper Extremity Assessment: Generalized weakness    Lower Extremity Assessment Lower Extremity Assessment: Generalized weakness       Communication   Communication: No difficulties  Cognition Arousal/Alertness: Awake/alert Behavior During Therapy: WFL for tasks assessed/performed Overall Cognitive Status: Within Functional Limits for tasks assessed                                        General Comments      Exercises     Assessment/Plan     PT Assessment Patient needs continued PT services  PT Problem List Decreased strength;Decreased activity tolerance;Decreased balance;Decreased range of motion;Decreased mobility;Decreased safety awareness;Decreased knowledge of use of DME       PT Treatment Interventions DME instruction;Gait training;Functional mobility training;Therapeutic activities;Therapeutic exercise;Balance training;Patient/family education    PT Goals (Current goals can be found in the Care Plan section)  Acute Rehab PT Goals Patient Stated Goal: to go home PT Goal Formulation: With patient Time For Goal Achievement: 06/12/20 Potential to Achieve Goals: Good    Frequency Min 3X/week   Barriers to discharge        Co-evaluation               AM-PAC PT "6 Clicks" Mobility  Outcome Measure Help needed turning from your back to your side while in a flat bed without using bedrails?: A Little Help needed moving from lying on your back to sitting on the side of a flat bed without using bedrails?: A Little Help needed moving to and from a bed to a chair (including a wheelchair)?: A Little Help needed standing up from a chair using your arms (e.g., wheelchair or bedside chair)?: A Little Help needed to walk in hospital room?: A Little Help needed climbing 3-5 steps with a railing? : A Lot 6 Click Score: 17    End of Session Equipment Utilized During Treatment: Gait belt Activity Tolerance: Patient tolerated treatment well Patient left: in bed;with call bell/phone within reach;with bed alarm set Nurse Communication: Mobility status PT Visit Diagnosis: Unsteadiness on feet (R26.81);Other abnormalities of gait and mobility (R26.89);Muscle weakness (generalized) (M62.81);Repeated falls (R29.6)    Time: 8299-3716 PT Time Calculation (min) (ACUTE ONLY): 23 min   Charges:   PT Evaluation $PT Eval Low Complexity: 1 Low          Morgan Clay A. Morgan Clay PT, DPT Acute Rehabilitation Services Pager  [phone] Office [phone]   Morgan Clay 05/29/2020, 4:34 PM

## 2020-05-29 NOTE — Anesthesia Preprocedure Evaluation (Addendum)
Anesthesia Evaluation  Patient identified by MRN, date of birth, ID band Patient awake    Reviewed: Allergy & Precautions, NPO status , Patient's Chart, lab work & pertinent test results  History of Anesthesia Complications Negative for: history of anesthetic complications  Airway Mallampati: III  TM Distance: >3 FB Neck ROM: Full    Dental  (+) Dental Advisory Given, Partial Upper, Partial Lower   Pulmonary asthma , sleep apnea (no CPAP use) , COPD,    Pulmonary exam normal        Cardiovascular hypertension, Pt. on home beta blockers and Pt. on medications +CHF  Normal cardiovascular exam+ dysrhythmias Atrial Fibrillation + Cardiac Defibrillator + Valvular Problems/Murmurs AI    '21 TTE - EF 50 to 55%. Moderate left ventricular hypertrophy. Grade II diastolic dysfunction (pseudonormalization). Right ventricular systolic function is mildly reduced. Left atrial size was severely dilated. Mild mitral valve regurgitation. Aortic valve regurgitation is moderate.     Neuro/Psych PSYCHIATRIC DISORDERS Depression negative neurological ROS     GI/Hepatic negative GI ROS, (+) Hepatitis -, B, C  Endo/Other  diabetesHypothyroidism   Renal/GU CRFRenal disease     Musculoskeletal  (+) Arthritis ,   Abdominal   Peds  Hematology  (+) anemia ,  INR 2.1 on 1/20 On eliquis    Anesthesia Other Findings Covid test negative Covid+ 05/03/20    Reproductive/Obstetrics                            Anesthesia Physical Anesthesia Plan  ASA: III  Anesthesia Plan: MAC   Post-op Pain Management:    Induction: Intravenous  PONV Risk Score and Plan: 2 and Propofol infusion and Treatment may vary due to age or medical condition  Airway Management Planned: Nasal Cannula and Natural Airway  Additional Equipment: None  Intra-op Plan:   Post-operative Plan:   Informed Consent: I have reviewed the  patients History and Physical, chart, labs and discussed the procedure including the risks, benefits and alternatives for the proposed anesthesia with the patient or authorized representative who has indicated his/her understanding and acceptance.       Plan Discussed with: CRNA and Anesthesiologist  Anesthesia Plan Comments:        Anesthesia Quick Evaluation

## 2020-05-29 NOTE — Plan of Care (Signed)

## 2020-05-29 NOTE — Progress Notes (Signed)
PROGRESS NOTE  Morgan Clay DXA:[id number] DOB: [birth year] DOA: 05/27/2020 PCP: Patient, No Pcp Per  HPI/Recap of past 24 hours:  Morgan Clay is a 74 y.o. female with history of chronic anemia and GI bleed who had a recent capsule endoscopy and it was not sure if it left the stomach.  Was advised to come to the ER.  On arrival to the ER patient complained of shortness of breath and chronic melanotic stools.  Denies any abdominal pain nausea vomiting or chest pain.  Denies any fever or chills.  ED Course: The ER KUB did not show any features for capsule endoscopy.  Did show some concerning features for bowel obstruction but patient denies any nausea vomiting last bowel movement was around 3 days ago.  On presentation, labs were significant for hemoglobin of 5.2, positive FOBT, INR was 2.1 creatinine 2.8.  Patient was admitted for acute on chronic GI bleed.  Patient has had a recent COVID test positive on December 27, 21.  Patient is asymptomatic.  Post EGD on 05/29/2020 by Dr. Loletha Carrow.  05/29/20: Patient was seen and examined at bedside.  She complains of left hip pain, pain management added.   Assessment/Plan: Principal Problem:   Acute GI bleeding Active Problems:   Hypothyroidism   OBSTRUCTIVE SLEEP APNEA   Chronic diastolic CHF (congestive heart failure) (HCC)   Nonischemic cardiomyopathy (HCC)   Type 2 diabetes, controlled, with renal manifestation (HCC)   CKD (chronic kidney disease) stage 4, GFR 15-29 ml/min    Atrial fibrillation, chronic   AVM (arteriovenous malformation) of small bowel, acquired   Anemia   Acute blood loss anemia   COVID-19 virus infection  Acute on chronic GI bleed She was recently seen by GI, she had capsule endoscopy.  It was unclear whether or not she had passed it. Presented with hemoglobin of 5.2 with positive FOBT 2 unit PRBCs were ordered to be transfused Repeated hemoglobin 8.2 from 6.6 Continue to hold off home oral anticoagulation,  Eliquis. Patient denies any overt bleeding Continue to closely monitor H&H She is currently undergoing bowel prep. [facility] consulted by admitting physician Post EGD by Dr. Loletha Carrow on 05/29/2020, successful completion of the video capsule enteroscope placement, results will be read tomorrow.  Acute blood loss anemia in the setting of suspected upper GI bleed Management as stated above Completed IV PPI twice daily, switched to oral Protonix 40 mg daily on 05/29/2018. Transfuse hemoglobin less than 7.0  Left hip pain Pain control in place along with bowel regimen daily Continue to monitor PT assessment Fall precautions  Essential hypertension BP meds on hold except for amiodarone and Coreg due to history of A. fib/A.  Flutter. Continue to closely monitor blood pressure and maintain MAP greater than 65  Paroxysmal A. fib/A-flutter She is on Eliquis at home, her last dose was on 05/27/2020 Continue to hold of Eliquis for now due to suspected GI bleed Defer to GI to restart anticoagulation Continue low-dose of Coreg to avoid beta-blockade withdrawal Continue amiodarone at lower dose 100 mg daily  Chronic diastolic CHF Euvolemic on exam Last 2D echo done on 12/29/2019 showed normal LVEF 50 to 55% with grade 2 diastolic dysfunction Continue strict I's and O's and daily weight Most cardiac medications on hold to avoid hypotension in the setting of GI bleed  Hypothyroidism Continue home levothyroxine       Code Status: Full code  Family Communication: None at bedside.  Disposition Plan: Likely will discharge to home on 05/30/2020,  possibly with home health PT OT   Consultants:  GI  Procedures: None  Antimicrobials:  None  DVT prophylaxis: SCDs  Status is: Inpatient   Dispo:  Patient From: Home  Planned Disposition: Home  Expected discharge date: 05/31/2020  Medically stable for discharge: No, ongoing management of suspected upper GI bleed, acute blood loss,  anemia requiring blood transfusion.         Objective: Vitals:   05/29/20 1000 05/29/20 1041 05/29/20 1051 05/29/20 1137  BP: (!) 136/48 (!) 141/60 126/86 (!) 151/50  Pulse: 64 63 (!) 58 60  Resp: 16 11 17 17   Temp: 98 F (36.7 C) 98.1 F (36.7 C)  (!) 97.5 F (36.4 C)  TempSrc: Oral Temporal  Oral  SpO2: 100% 100% 100% 100%  Weight:      Height:        Intake/Output Summary (Last 24 hours) at 05/29/2020 1525 Last data filed at 05/29/2020 1035 Gross per 24 hour  Intake 912 ml  Output -  Net 912 ml   Filed Weights   05/27/20 1344  Weight: 63.5 kg    Exam:  . General: 75 y.o. year-old female well-developed well-nourished in no acute distress.  Alert oriented x3.   . Cardiovascular: Regular rate and rhythm no rubs or gallops.  [name] Respiratory: Clear to auscultation no wheeze rales.   . Abdomen: Soft nontender normal bowel sounds present.  . Musculoskeletal: No lower extremity edema bilaterally.   [name] Psychiatry: Mood is appropriate for condition and setting.  Data Reviewed: CBC: Recent Labs  Lab 05/27/20 1038 05/27/20 1357 05/28/20 0230 05/28/20 1153 05/29/20 0311  WBC 6.4 7.1 6.4  --  6.0  HGB 5.1 cL* 5.2* 6.8* 6.6* 8.2*  HCT 15.7 cL* 16.8* 20.3* 20.7* 24.6*  MCV 90.6 93.9 91.9  --  90.4  PLT 191.0 217 171  --  161   Basic Metabolic Panel: Recent Labs  Lab 05/27/20 1038 05/27/20 1357 05/28/20 0230 05/29/20 0311  NA 138 137 139 134*  K 4.5 4.5 4.0 3.7  CL 106 104 107 103  CO2 23 20* 20* 20*  GLUCOSE 105* 120* 86 107*  BUN 67* 66* 65* 63*  CREATININE 2.78* 2.89* 2.90* 2.79*  CALCIUM 9.0 9.0 8.7* 8.7*  MG  --   --   --  2.4   GFR: Estimated Creatinine Clearance: 14.8 mL/min (A) (by C-G formula based on SCr of 2.79 mg/dL (H)). Liver Function Tests: Recent Labs  Lab 05/27/20 1357  AST 18  ALT 14  ALKPHOS 75  BILITOT 0.7  PROT 7.0  ALBUMIN 3.6   No results for input(s): LIPASE, AMYLASE in the last 168 hours. No results for input(s):  AMMONIA in the last 168 hours. Coagulation Profile: Recent Labs  Lab 05/27/20 1843  INR 2.1*   Cardiac Enzymes: No results for input(s): CKTOTAL, CKMB, CKMBINDEX, TROPONINI in the last 168 hours. BNP (last 3 results) Recent Labs    07/17/19 1352 07/23/19 1033  PROBNP 811.0* 637.0*   HbA1C: No results for input(s): HGBA1C in the last 72 hours. CBG: Recent Labs  Lab 05/28/20 1149 05/28/20 1709 05/28/20 1921 05/29/20 0058 05/29/20 0420  GLUCAP 92 96 134* 126* 99   Lipid Profile: No results for input(s): CHOL, HDL, LDLCALC, TRIG, CHOLHDL, LDLDIRECT in the last 72 hours. Thyroid Function Tests: No results for input(s): TSH, T4TOTAL, FREET4, T3FREE, THYROIDAB in the last 72 hours. Anemia Panel: Recent Labs    05/27/20 1038  FERRITIN 42.9   Urine analysis:  Component Value Date/Time   COLORURINE YELLOW 03/05/2018 0902   APPEARANCEUR CLEAR 03/05/2018 0902   LABSPEC 1.008 03/05/2018 0902   PHURINE 5.0 03/05/2018 0902   GLUCOSEU NEGATIVE 03/05/2018 0902   HGBUR NEGATIVE 03/05/2018 0902   HGBUR negative 02/28/2008 0000   BILIRUBINUR NEGATIVE 03/05/2018 0902   KETONESUR 5 (A) 03/05/2018 0902   PROTEINUR NEGATIVE 03/05/2018 0902   UROBILINOGEN 1.0 07/19/2010 1752   NITRITE NEGATIVE 03/05/2018 0902   LEUKOCYTESUR NEGATIVE 03/05/2018 0902   Sepsis Labs: @LABRCNTIP (procalcitonin:4,lacticidven:4)  ) Recent Results (from the past 240 hour(s))  SARS Coronavirus 2 by RT PCR (hospital order, performed in [place] hospital lab) Nasopharyngeal Nasopharyngeal Swab     Status: None   Collection Time: 05/27/20  8:08 PM   Specimen: Nasopharyngeal Swab  Result Value Ref Range Status   SARS Coronavirus 2 NEGATIVE NEGATIVE Final    Comment: (NOTE) SARS-CoV-2 target nucleic acids are NOT DETECTED.  The SARS-CoV-2 RNA is generally detectable in upper and lower respiratory specimens during the acute phase of infection. The lowest concentration of SARS-CoV-2 viral copies this  assay can detect is 250 copies / mL. A negative result does not preclude SARS-CoV-2 infection and should not be used as the sole basis for treatment or other patient management decisions.  A negative result may occur with improper specimen collection / handling, submission of specimen other than nasopharyngeal swab, presence of viral mutation(s) within the areas targeted by this assay, and inadequate number of viral copies (<250 copies / mL). A negative result must be combined with clinical observations, patient history, and epidemiological information.  Fact Sheet for Patients:   StrictlyIdeas.no  Fact Sheet for Healthcare Providers: BankingDealers.co.za  This test is not yet approved or  cleared by the [place] FDA and has been authorized for detection and/or diagnosis of SARS-CoV-2 by FDA under an Emergency Use Authorization (EUA).  This EUA will remain in effect (meaning this test can be used) for the duration of the COVID-19 declaration under Section 564(b)(1) of the Act, 21 U.S.C. section 360bbb-3(b)(1), unless the authorization is terminated or revoked sooner.  Performed at [facility], [place] [address], [place], [place] [address]       Studies: No results found.  Scheduled Meds: . amiodarone  100 mg Oral Daily  . carvedilol  3.125 mg Oral BID  . levothyroxine  137 mcg Oral QAC breakfast  . pantoprazole  40 mg Oral Daily    Continuous Infusions:   LOS: 2 days     Kayleen Memos, MD [facility] Pager [phone]  If 7PM-7AM, please contact night-coverage [email or web address] Password [facility] 05/29/2020, 3:25 PM

## 2020-05-29 NOTE — Progress Notes (Signed)
Patient refusing blood sugar checks.

## 2020-05-30 ENCOUNTER — Inpatient Hospital Stay (HOSPITAL_COMMUNITY): Payer: Medicare Other

## 2020-05-30 DIAGNOSIS — D62 Acute posthemorrhagic anemia: Secondary | ICD-10-CM | POA: Diagnosis not present

## 2020-05-30 DIAGNOSIS — K922 Gastrointestinal hemorrhage, unspecified: Secondary | ICD-10-CM

## 2020-05-30 DIAGNOSIS — K5520 Angiodysplasia of colon without hemorrhage: Secondary | ICD-10-CM

## 2020-05-30 LAB — CBC
HCT: 26.1 % — ABNORMAL LOW (ref 36.0–46.0)
Hemoglobin: 8.3 g/dL — ABNORMAL LOW (ref 12.0–15.0)
MCH: 29.2 pg (ref 26.0–34.0)
MCHC: 31.8 g/dL (ref 30.0–36.0)
MCV: 91.9 fL (ref 80.0–100.0)
Platelets: 184 10*3/uL (ref 150–400)
RBC: 2.84 MIL/uL — ABNORMAL LOW (ref 3.87–5.11)
RDW: 17.7 % — ABNORMAL HIGH (ref 11.5–15.5)
WBC: 7.4 10*3/uL (ref 4.0–10.5)
nRBC: 0 % (ref 0.0–0.2)

## 2020-05-30 MED ORDER — PEG-KCL-NACL-NASULF-NA ASC-C 100 G PO SOLR
0.5000 | Freq: Once | ORAL | Status: AC
  Administered 2020-05-30: 100 g via ORAL
  Filled 2020-05-30: qty 1

## 2020-05-30 MED ORDER — ONDANSETRON HCL 4 MG/2ML IJ SOLN
INTRAMUSCULAR | Status: AC
  Filled 2020-05-30: qty 2

## 2020-05-30 MED ORDER — METOCLOPRAMIDE HCL 5 MG/ML IJ SOLN
10.0000 mg | Freq: Once | INTRAMUSCULAR | Status: AC
  Administered 2020-05-30: 10 mg via INTRAVENOUS
  Filled 2020-05-30: qty 2

## 2020-05-30 MED ORDER — ONDANSETRON HCL 4 MG/2ML IJ SOLN
4.0000 mg | Freq: Four times a day (QID) | INTRAMUSCULAR | Status: DC | PRN
  Administered 2020-05-30 – 2020-05-31 (×2): 4 mg via INTRAVENOUS
  Filled 2020-05-30: qty 2

## 2020-05-30 MED ORDER — PEG-KCL-NACL-NASULF-NA ASC-C 100 G PO SOLR: 1.0000 | Freq: Once | ORAL | Status: DC

## 2020-05-30 NOTE — Progress Notes (Signed)
Bowel Prep complete for 1700 dose

## 2020-05-30 NOTE — Plan of Care (Signed)
Patient is s/p upper endoscopy procedure on 1/22. Patient does not have any complaints except for pain in left hip from previous fall. Gave oxycodone as ordered prn for pain x 1. Patient can ambulate with standby assistance to the [facility]. No bloody stools so far this shift. Endoscopy monitor and leads removed at 2300 and placed outside of the patient's room. Tele box back in place. NAD or needs voiced. Will continue to monitor and continue current POC.

## 2020-05-30 NOTE — Progress Notes (Addendum)
Daily Rounding Note  05/30/2020, 9:44 AM  LOS: 3 days   SUBJECTIVE:   Chief complaint: Recurrent GI bleed.  Sudden onset nausea just before I arrived.  I observed clear watery emesis of ~ 20 ml.  Nausea is new.  Received po meds this AM but no AM meal yet consumed.   Also c/o of feeling dizzy just sitting in the bed.  No abd pain but tender in R abdomen on exam.  Last BM ~ 2 d ago. VCE recorder retrieved by endo staff.      Additional ROS: Denies chest pain dyspnea or dysuria  OBJECTIVE:         Vital signs in last 24 hours:    Temp:  [97.5 F (36.4 C)-98.6 F (37 C)] 98.1 F (36.7 C) (01/23 0911) Pulse Rate:  [58-73] 60 (01/23 0911) Resp:  [11-17] 16 (01/23 0911) BP: (126-160)/(48-86) 139/57 (01/23 0911) SpO2:  [97 %-100 %] 100 % (01/23 0911) Last BM Date: 05/27/20 Filed Weights   05/27/20 1344  Weight: 63.5 kg   General: looks chronically ill   Heart: RRR Chest: clear bil.   Abdomen: soft.  Tender R mid abdomen, no guard or rebound.  BS hypoactive. Extremities:  No CCE.   Neuro/Psych:  Oriented x 3.  Alert.  Moves all 4s.    Intake/Output from previous day: 01/22 0701 - 01/23 0700 In: 340 [P.O.:240; I.V.:100] Out: -   Intake/Output this shift: No intake/output data recorded.  Lab Results: Recent Labs    05/28/20 0230 05/28/20 1153 05/29/20 0311 05/30/20 0213  WBC 6.4  --  6.0 7.4  HGB 6.8* 6.6* 8.2* 8.3*  HCT 20.3* 20.7* 24.6* 26.1*  PLT 171  --  171 184   BMET Recent Labs    05/27/20 1357 05/28/20 0230 05/29/20 0311  NA 137 139 134*  K 4.5 4.0 3.7  CL 104 107 103  CO2 20* 20* 20*  GLUCOSE 120* 86 107*  BUN 66* 65* 63*  CREATININE 2.89* 2.90* 2.79*  CALCIUM 9.0 8.7* 8.7*   LFT Recent Labs    05/27/20 1357  PROT 7.0  ALBUMIN 3.6  AST 18  ALT 14  ALKPHOS 75  BILITOT 0.7   PT/INR Recent Labs    05/27/20 1843  LABPROT 22.5*  INR 2.1*   Hepatitis Panel No results for  input(s): HEPBSAG, HCVAB, HEPAIGM, HEPBIGM in the last 72 hours.  Studies/Results: DG HIP PORT UNILAT WITH PELVIS 1V LEFT  Result Date: 05/30/2020 CLINICAL DATA:  Left hip pain after fall at bath tub. Pain along the iliac crest EXAM: DG HIP (WITH OR WITHOUT PELVIS) 1V PORT LEFT COMPARISON:  Abdominal CT from 2 days ago FINDINGS: Distal colonic contrast from prior CT. Calcified uterine fibroid measuring 6 cm. Negative for hip fracture or subluxation. No evidence of pelvic ring fracture or diastasis. Atherosclerosis. There is a device over the right lower quadrant correlating with history of capsule endoscopy. IMPRESSION: 1. No acute finding. 2. Capsule endoscope over the right lower quadrant. Electronically Signed   By: Morgan Clay M.D.   On: 05/30/2020 07:38    Scheduled Meds: . amiodarone  100 mg Oral Daily  . carvedilol  3.125 mg Oral BID  . levothyroxine  137 mcg Oral QAC breakfast  . pantoprazole  40 mg Oral Daily   Continuous Infusions: PRN Meds:.acetaminophen **OR** acetaminophen, HYDROmorphone (DILAUDID) injection, oxyCODONE   ASSESMENT:   *   Recurrent acute on chronic GI  blood loss anemia. Multiple endoscopic studies, failed VCE 05/19/2020 (capsule never left stomach).  Findings to date include gastric AVMs previously ablated. 05/29/2020 upper endoscopy with placement of video capsule into second duodenum. Hgb 5.1 >> 2 PRBCs >> 8.3  *    Stage IV CKD.  *    Ischemic CM.  Chronic Eliquis, not on hold.  *    Compensated cirrhosis, eradicated hep C.   PLAN   *   CBC in AM.    *   Await reading of VCE.      Morgan Clay  05/30/2020, 9:44 AM Phone [phone]  I have discussed the case with the PA, and that is the plan I formulated. I personally interviewed and examined the patient.  Video capsule study showed fresh blood in at least the distal ileum if not the terminal ileum.  The exact source could not be localized due to reduced visualization from the bleeding.   There was a nonbleeding AVM proximal to that. Fortunately, hemoglobin stable over last 24 hours.  Morgan Clay is feeling nauseated today and has not had anything to eat.  I had a discussion with her, and her daughter was at the bedside.  This is a challenging scenario, since it is difficult to know with certainty if the bleeding source is in the most terminal ileum and therefore potentially within reach of colonoscopy with intubation of the TI.  However, given this recurrent bleeding, need for Eliquis, and how elusive it has been to get long-term control of this problem for her, I think an attempt at colonoscopic localization and treatment is warranted.  I offered to get that done tomorrow if she feels she is able to tolerate bowel preparation later today.  She is uncertain since she feels nauseated today, but wants to give it a try.  If she is unable to tolerate the prep, then another attempt to be made for colonoscopy the following day.  We have changed her ondansetron to 4 mg IV every 6 hours for 4 doses.  (She has a prolonged QT on most recent EKG, but it is probably somewhat artificially prolonged given the bundle branch block).  Caution warranted, so will not keep her on standing dose of ondansetron for long period  Risk and benefits colonoscopy reviewed and she was agreeable  The benefits and risks of the planned procedure were described in detail with the patient or (when appropriate) their health care proxy.  Risks were outlined as including, but not limited to, bleeding, infection, perforation, adverse medication reaction leading to cardiac or pulmonary decompensation, pancreatitis (if ERCP).  The limitation of incomplete mucosal visualization was also discussed.  No guarantees or warranties were given.  Patient at increased risk for cardiopulmonary complications of procedure due to medical comorbidities.  (Morgan Clay will manage the consult service starting tomorrow and will perform  Morgan Clay's colonoscopy.)  Morgan Clay Office: [phone]

## 2020-05-30 NOTE — H&P (View-Only) (Signed)
Daily Rounding Note  05/30/2020, 9:44 AM  LOS: 3 days   SUBJECTIVE:   Chief complaint: Recurrent GI bleed.  Sudden onset nausea just before I arrived.  I observed clear watery emesis of ~ 20 ml.  Nausea is new.  Received po meds this AM but no AM meal yet consumed.   Also c/o of feeling dizzy just sitting in the bed.  No abd pain but tender in R abdomen on exam.  Last BM ~ 2 d ago. VCE recorder retrieved by endo staff.      Additional ROS: Denies chest pain dyspnea or dysuria  OBJECTIVE:         Vital signs in last 24 hours:    Temp:  [97.5 F (36.4 C)-98.6 F (37 C)] 98.1 F (36.7 C) (01/23 0911) Pulse Rate:  [58-73] 60 (01/23 0911) Resp:  [11-17] 16 (01/23 0911) BP: (126-160)/(48-86) 139/57 (01/23 0911) SpO2:  [97 %-100 %] 100 % (01/23 0911) Last BM Date: 05/27/20 Filed Weights   05/27/20 1344  Weight: 63.5 kg   General: looks chronically ill   Heart: RRR Chest: clear bil.   Abdomen: soft.  Tender R mid abdomen, no guard or rebound.  BS hypoactive. Extremities:  No CCE.   Neuro/Psych:  Oriented x 3.  Alert.  Moves all 4s.    Intake/Output from previous day: 01/22 0701 - 01/23 0700 In: 340 [P.O.:240; I.V.:100] Out: -   Intake/Output this shift: No intake/output data recorded.  Lab Results: Recent Labs    05/28/20 0230 05/28/20 1153 05/29/20 0311 05/30/20 0213  WBC 6.4  --  6.0 7.4  HGB 6.8* 6.6* 8.2* 8.3*  HCT 20.3* 20.7* 24.6* 26.1*  PLT 171  --  171 184   BMET Recent Labs    05/27/20 1357 05/28/20 0230 05/29/20 0311  NA 137 139 134*  K 4.5 4.0 3.7  CL 104 107 103  CO2 20* 20* 20*  GLUCOSE 120* 86 107*  BUN 66* 65* 63*  CREATININE 2.89* 2.90* 2.79*  CALCIUM 9.0 8.7* 8.7*   LFT Recent Labs    05/27/20 1357  PROT 7.0  ALBUMIN 3.6  AST 18  ALT 14  ALKPHOS 75  BILITOT 0.7   PT/INR Recent Labs    05/27/20 1843  LABPROT 22.5*  INR 2.1*   Hepatitis Panel No results for  input(s): HEPBSAG, HCVAB, HEPAIGM, HEPBIGM in the last 72 hours.  Studies/Results: DG HIP PORT UNILAT WITH PELVIS 1V LEFT  Result Date: 05/30/2020 CLINICAL DATA:  Left hip pain after fall at bath tub. Pain along the iliac crest EXAM: DG HIP (WITH OR WITHOUT PELVIS) 1V PORT LEFT COMPARISON:  Abdominal CT from 2 days ago FINDINGS: Distal colonic contrast from prior CT. Calcified uterine fibroid measuring 6 cm. Negative for hip fracture or subluxation. No evidence of pelvic ring fracture or diastasis. Atherosclerosis. There is a device over the right lower quadrant correlating with history of capsule endoscopy. IMPRESSION: 1. No acute finding. 2. Capsule endoscope over the right lower quadrant. Electronically Signed   By: Monte Fantasia M.D.   On: 05/30/2020 07:38    Scheduled Meds: . amiodarone  100 mg Oral Daily  . carvedilol  3.125 mg Oral BID  . levothyroxine  137 mcg Oral QAC breakfast  . pantoprazole  40 mg Oral Daily   Continuous Infusions: PRN Meds:.acetaminophen **OR** acetaminophen, HYDROmorphone (DILAUDID) injection, oxyCODONE   ASSESMENT:   *   Recurrent acute on chronic GI  blood loss anemia. Multiple endoscopic studies, failed VCE 05/19/2020 (capsule never left stomach).  Findings to date include gastric AVMs previously ablated. 05/29/2020 upper endoscopy with placement of video capsule into second duodenum. Hgb 5.1 >> 2 PRBCs >> 8.3  *    Stage IV CKD.  *    Ischemic CM.  Chronic Eliquis, not on hold.  *    Compensated cirrhosis, eradicated hep C.   PLAN   *   CBC in AM.    *   Await reading of VCE.      Morgan Clay  05/30/2020, 9:44 AM Phone [phone]  I have discussed the case with the PA, and that is the plan I formulated. I personally interviewed and examined the patient.  Video capsule study showed fresh blood in at least the distal ileum if not the terminal ileum.  The exact source could not be localized due to reduced visualization from the bleeding.   There was a nonbleeding AVM proximal to that. Fortunately, hemoglobin stable over last 24 hours.  Shaun is feeling nauseated today and has not had anything to eat.  I had a discussion with her, and her daughter was at the bedside.  This is a challenging scenario, since it is difficult to know with certainty if the bleeding source is in the most terminal ileum and therefore potentially within reach of colonoscopy with intubation of the TI.  However, given this recurrent bleeding, need for Eliquis, and how elusive it has been to get long-term control of this problem for her, I think an attempt at colonoscopic localization and treatment is warranted.  I offered to get that done tomorrow if she feels she is able to tolerate bowel preparation later today.  She is uncertain since she feels nauseated today, but wants to give it a try.  If she is unable to tolerate the prep, then another attempt to be made for colonoscopy the following day.  We have changed her ondansetron to 4 mg IV every 6 hours for 4 doses.  (She has a prolonged QT on most recent EKG, but it is probably somewhat artificially prolonged given the bundle branch block).  Caution warranted, so will not keep her on standing dose of ondansetron for long period  Risk and benefits colonoscopy reviewed and she was agreeable  The benefits and risks of the planned procedure were described in detail with the patient or (when appropriate) their health care proxy.  Risks were outlined as including, but not limited to, bleeding, infection, perforation, adverse medication reaction leading to cardiac or pulmonary decompensation, pancreatitis (if ERCP).  The limitation of incomplete mucosal visualization was also discussed.  No guarantees or warranties were given.  Patient at increased risk for cardiopulmonary complications of procedure due to medical comorbidities.  (Dr. Scarlette Shorts will manage the consult service starting tomorrow and will perform  Kiaria's colonoscopy.)  [name] Office: [phone]

## 2020-05-30 NOTE — Plan of Care (Signed)
No acute events since the previous night that I took care of her. Colonoscopy scheduled for tomorrow 1/24. First half of bowel prep completed, second half of bowel prep will begin shortly. NAD or needs voiced. Will continue to monitor and continue current POC.

## 2020-05-30 NOTE — Plan of Care (Signed)

## 2020-05-30 NOTE — Progress Notes (Signed)
PROGRESS NOTE  Morgan Clay HAL:[id number] DOB: December 10, 1945 DOA: 05/27/2020 PCP: Patient, No Pcp Per  HPI/Recap of past 24 hours:  Morgan Clay is a 75 y.o. female with history of chronic anemia and GI bleed who had a recent capsule endoscopy and it was not sure if it left the stomach.  Was advised to come to the ER.  On arrival to the ER patient complained of shortness of breath and chronic melanotic stools.  Denies any abdominal pain nausea vomiting or chest pain.  Denies any fever or chills.  ED Course: The ER KUB did not show any features for capsule endoscopy.  Did show some concerning features for bowel obstruction but patient denies any nausea vomiting last bowel movement was around 3 days ago.  On presentation, labs were significant for hemoglobin of 5.2, positive FOBT, INR was 2.1 creatinine 2.8.  Patient was admitted for acute on chronic GI bleed.  Patient has had a recent COVID test positive on December 27, 21.  Patient is asymptomatic.  Post EGD on 05/29/2020 by Dr. Loletha Carrow.  05/30/20: Seen and examined.  Left hip pain is improved on current pain management.  Left hip x-ray no evidence of fracture or acute process.  Assessment/Plan: Principal Problem:   Acute GI bleeding Active Problems:   Hypothyroidism   OBSTRUCTIVE SLEEP APNEA   Chronic diastolic CHF (congestive heart failure) (HCC)   Nonischemic cardiomyopathy (HCC)   Type 2 diabetes, controlled, with renal manifestation (HCC)   CKD (chronic kidney disease) stage 4, GFR 15-29 ml/min    Atrial fibrillation, chronic   AVM (arteriovenous malformation) of small bowel, acquired   Anemia   Acute blood loss anemia   COVID-19 virus infection  Acute on chronic GI bleed She was recently seen by GI, she had capsule endoscopy.  It was unclear whether or not she had passed it. Presented with hemoglobin of 5.2 with positive FOBT 2 unit PRBCs were ordered to be transfused Repeated hemoglobin 8.3 from 8.2 from 6.6 Continue  to hold off home oral anticoagulation, Eliquis. Patient denies any overt bleeding Continue to closely monitor H&H She is currently undergoing bowel prep. [facility] consulted by admitting physician Post EGD by Dr. Loletha Carrow on 05/29/2020, successful completion of the video capsule enteroscope placement, results will be read on 05/30/2020. Plan colonoscopy inpatient due to underlying comorbidities.  Acute blood loss anemia in the setting of suspected upper GI bleed Management as stated above Completed IV PPI twice daily, switched to oral Protonix 40 mg daily on 05/29/2018. Transfuse hemoglobin less than 7.0  Left hip pain post fall, POA Pain control in place along with bowel regimen daily Left hip x-ray no evidence of acute process or fracture Continue fall precautions  Essential hypertension BP meds on hold except for amiodarone and Coreg due to history of A. fib/A.  Flutter. Continue to closely monitor blood pressure and maintain MAP greater than 65  Paroxysmal A. fib/A-flutter She is on Eliquis at home, her last dose was on 05/27/2020 Continue to hold of Eliquis for now due to suspected GI bleed Defer to GI to restart anticoagulation Continue low-dose of Coreg to avoid beta-blockade withdrawal Continue amiodarone at lower dose 100 mg daily  Chronic diastolic CHF Euvolemic on exam Last 2D echo done on 12/29/2019 showed normal LVEF 50 to 55% with grade 2 diastolic dysfunction Continue strict I's and O's and daily weight Most cardiac medications on hold to avoid hypotension in the setting of GI bleed Net I&O +1.8 L  Hypothyroidism  Continue home levothyroxine       Code Status: Full code  Family Communication: None at bedside.  Disposition Plan: Likely will discharge to home on 05/31/2020, with home health PT OT   Consultants:  GI  Procedures: None  Antimicrobials:  None  DVT prophylaxis: SCDs  Status is: Inpatient   Dispo:  Patient From: Home  Planned  Disposition: Home with Health Care Svc  Expected discharge date: 05/31/20 Medically stable for discharge: Yes, ongoing management of suspected upper GI bleed, acute blood loss, anemia requiring blood transfusion, plan colonoscopy inpatient due to underlying comorbidities.         Objective: Vitals:   05/29/20 1936 05/30/20 0348 05/30/20 0911 05/30/20 1400  BP: (!) 160/68 (!) 147/62 (!) 139/57 (!) 147/75  Pulse: 73 71 60 64  Resp: '16 15 16 18  ' Temp: 98 F (36.7 C) 98.6 F (37 C) 98.1 F (36.7 C) 97.8 F (36.6 C)  TempSrc:   Oral Oral  SpO2: 97% 97% 100% 100%  Weight:      Height:        Intake/Output Summary (Last 24 hours) at 05/30/2020 1710 Last data filed at 05/30/2020 1000 Gross per 24 hour  Intake 240 ml  Output 20 ml  Net 220 ml   Filed Weights   05/27/20 1344  Weight: 63.5 kg    Exam:  . General: 75 y.o. year-old female well-developed well-nourished in no acute stress.  Alert and oriented x3. .  Cardiovascular: Regular rate and rhythm no rubs or gallops.  [name] Respiratory: Clear to auscultation no wheezes or rales.  .  Abdomen: Soft nontender normal bowel sounds present.  . Musculoskeletal: No lower extremity edema bilaterally.   [name] Psychiatry: Mood is appropriate for condition and setting.  Data Reviewed: CBC: Recent Labs  Lab 05/27/20 1038 05/27/20 1357 05/28/20 0230 05/28/20 1153 05/29/20 0311 05/30/20 0213  WBC 6.4 7.1 6.4  --  6.0 7.4  HGB 5.1 cL* 5.2* 6.8* 6.6* 8.2* 8.3*  HCT 15.7 cL* 16.8* 20.3* 20.7* 24.6* 26.1*  MCV 90.6 93.9 91.9  --  90.4 91.9  PLT 191.0 217 171  --  171 440   Basic Metabolic Panel: Recent Labs  Lab 05/27/20 1038 05/27/20 1357 05/28/20 0230 05/29/20 0311  NA 138 137 139 134*  K 4.5 4.5 4.0 3.7  CL 106 104 107 103  CO2 23 20* 20* 20*  GLUCOSE 105* 120* 86 107*  BUN 67* 66* 65* 63*  CREATININE 2.78* 2.89* 2.90* 2.79*  CALCIUM 9.0 9.0 8.7* 8.7*  MG  --   --   --  2.4   GFR: Estimated Creatinine Clearance: 14.8  mL/min (A) (by C-G formula based on SCr of 2.79 mg/dL (H)). Liver Function Tests: Recent Labs  Lab 05/27/20 1357  AST 18  ALT 14  ALKPHOS 75  BILITOT 0.7  PROT 7.0  ALBUMIN 3.6   No results for input(s): LIPASE, AMYLASE in the last 168 hours. No results for input(s): AMMONIA in the last 168 hours. Coagulation Profile: Recent Labs  Lab 05/27/20 1843  INR 2.1*   Cardiac Enzymes: No results for input(s): CKTOTAL, CKMB, CKMBINDEX, TROPONINI in the last 168 hours. BNP (last 3 results) Recent Labs    07/17/19 1352 07/23/19 1033  PROBNP 811.0* 637.0*   HbA1C: No results for input(s): HGBA1C in the last 72 hours. CBG: Recent Labs  Lab 05/28/20 1149 05/28/20 1709 05/28/20 1921 05/29/20 0058 05/29/20 0420  GLUCAP 92 96 134* 126* 99   Lipid  Profile: No results for input(s): CHOL, HDL, LDLCALC, TRIG, CHOLHDL, LDLDIRECT in the last 72 hours. Thyroid Function Tests: No results for input(s): TSH, T4TOTAL, FREET4, T3FREE, THYROIDAB in the last 72 hours. Anemia Panel: No results for input(s): VITAMINB12, FOLATE, FERRITIN, TIBC, IRON, RETICCTPCT in the last 72 hours. Urine analysis:    Component Value Date/Time   COLORURINE YELLOW 03/05/2018 0902   APPEARANCEUR CLEAR 03/05/2018 0902   LABSPEC 1.008 03/05/2018 0902   PHURINE 5.0 03/05/2018 0902   GLUCOSEU NEGATIVE 03/05/2018 0902   HGBUR NEGATIVE 03/05/2018 0902   HGBUR negative 02/28/2008 0000   BILIRUBINUR NEGATIVE 03/05/2018 0902   KETONESUR 5 (A) 03/05/2018 0902   PROTEINUR NEGATIVE 03/05/2018 0902   UROBILINOGEN 1.0 07/19/2010 1752   NITRITE NEGATIVE 03/05/2018 0902   LEUKOCYTESUR NEGATIVE 03/05/2018 0902   Sepsis Labs: '@LABRCNTIP' (procalcitonin:4,lacticidven:4)  ) Recent Results (from the past 240 hour(s))  SARS Coronavirus 2 by RT PCR (hospital order, performed in [place] hospital lab) Nasopharyngeal Nasopharyngeal Swab     Status: None   Collection Time: 05/27/20  8:08 PM   Specimen: Nasopharyngeal Swab   Result Value Ref Range Status   SARS Coronavirus 2 NEGATIVE NEGATIVE Final    Comment: (NOTE) SARS-CoV-2 target nucleic acids are NOT DETECTED.  The SARS-CoV-2 RNA is generally detectable in upper and lower respiratory specimens during the acute phase of infection. The lowest concentration of SARS-CoV-2 viral copies this assay can detect is 250 copies / mL. A negative result does not preclude SARS-CoV-2 infection and should not be used as the sole basis for treatment or other patient management decisions.  A negative result may occur with improper specimen collection / handling, submission of specimen other than nasopharyngeal swab, presence of viral mutation(s) within the areas targeted by this assay, and inadequate number of viral copies (<250 copies / mL). A negative result must be combined with clinical observations, patient history, and epidemiological information.  Fact Sheet for Patients:   StrictlyIdeas.no  Fact Sheet for Healthcare Providers: BankingDealers.co.za  This test is not yet approved or  cleared by the [place] FDA and has been authorized for detection and/or diagnosis of SARS-CoV-2 by FDA under an Emergency Use Authorization (EUA).  This EUA will remain in effect (meaning this test can be used) for the duration of the COVID-19 declaration under Section 564(b)(1) of the Act, 21 U.S.C. section 360bbb-3(b)(1), unless the authorization is terminated or revoked sooner.  Performed at [facility], [place] [address] [place], [place] [address]       Studies: DG HIP PORT UNILAT WITH PELVIS 1V LEFT  Result Date: 05/30/2020 CLINICAL DATA:  Left hip pain after fall at bath tub. Pain along the iliac crest EXAM: DG HIP (WITH OR WITHOUT PELVIS) 1V PORT LEFT COMPARISON:  Abdominal CT from 2 days ago FINDINGS: Distal colonic contrast from prior CT. Calcified uterine fibroid measuring 6 cm. Negative for hip fracture or  subluxation. No evidence of pelvic ring fracture or diastasis. Atherosclerosis. There is a device over the right lower quadrant correlating with history of capsule endoscopy. IMPRESSION: 1. No acute finding. 2. Capsule endoscope over the right lower quadrant. Electronically Signed   By: Monte Fantasia M.D.   On: 05/30/2020 07:38    Scheduled Meds: . amiodarone  100 mg Oral Daily  . carvedilol  3.125 mg Oral BID  . levothyroxine  137 mcg Oral QAC breakfast  . metoCLOPramide (REGLAN) injection  10 mg Intravenous Once  . pantoprazole  40 mg Oral Daily  .  peg 3350 powder  0.5 kit Oral Once   And  . peg 3350 powder  0.5 kit Oral Once    Continuous Infusions:   LOS: 3 days     Kayleen Memos, MD [facility] Pager [phone]  If 7PM-7AM, please contact night-coverage [email or web address] Password [email or web address] 05/30/2020, 5:10 PM

## 2020-05-31 ENCOUNTER — Inpatient Hospital Stay (HOSPITAL_COMMUNITY): Payer: Medicare Other | Admitting: Anesthesiology

## 2020-05-31 ENCOUNTER — Encounter (HOSPITAL_COMMUNITY): Payer: Self-pay | Admitting: Gastroenterology

## 2020-05-31 ENCOUNTER — Encounter (HOSPITAL_COMMUNITY)
Admission: EM | Disposition: A | Discharge status: Home / Self Care | Payer: Self-pay | Source: Home / Self Care | Attending: Internal Medicine

## 2020-05-31 HISTORY — PX: COLONOSCOPY WITH PROPOFOL: SHX5780

## 2020-05-31 HISTORY — PX: SUBMUCOSAL TATTOO INJECTION: SHX6856

## 2020-05-31 HISTORY — PX: HOT HEMOSTASIS: SHX5433

## 2020-05-31 LAB — CBC
HCT: 28.4 % — ABNORMAL LOW (ref 36.0–46.0)
Hemoglobin: 8.7 g/dL — ABNORMAL LOW (ref 12.0–15.0)
MCH: 28.7 pg (ref 26.0–34.0)
MCHC: 30.6 g/dL (ref 30.0–36.0)
MCV: 93.7 fL (ref 80.0–100.0)
Platelets: 204 10*3/uL (ref 150–400)
RBC: 3.03 MIL/uL — ABNORMAL LOW (ref 3.87–5.11)
RDW: 17.8 % — ABNORMAL HIGH (ref 11.5–15.5)
WBC: 7 10*3/uL (ref 4.0–10.5)
nRBC: 0 % (ref 0.0–0.2)

## 2020-05-31 SURGERY — COLONOSCOPY WITH PROPOFOL
Anesthesia: Monitor Anesthesia Care

## 2020-05-31 MED ORDER — SPOT INK MARKER SYRINGE KIT
PACK | SUBMUCOSAL | Status: DC | PRN
  Administered 2020-05-31: 3.5 mL via SUBMUCOSAL

## 2020-05-31 MED ORDER — PROPOFOL 500 MG/50ML IV EMUL
INTRAVENOUS | Status: DC | PRN
  Administered 2020-05-31: 200 ug/kg/min via INTRAVENOUS

## 2020-05-31 MED ORDER — HYDRALAZINE HCL 20 MG/ML IJ SOLN
INTRAMUSCULAR | Status: DC | PRN
  Administered 2020-05-31: 10 mg via INTRAVENOUS

## 2020-05-31 MED ORDER — LABETALOL HCL 5 MG/ML IV SOLN
10.0000 mg | Freq: Once | INTRAVENOUS | Status: AC
  Administered 2020-05-31: 10 mg via INTRAVENOUS

## 2020-05-31 MED ORDER — LABETALOL HCL 5 MG/ML IV SOLN
INTRAVENOUS | Status: DC | PRN
  Administered 2020-05-31: 10 mg via INTRAVENOUS

## 2020-05-31 MED ORDER — LABETALOL HCL 5 MG/ML IV SOLN
INTRAVENOUS | Status: AC
  Filled 2020-05-31: qty 4

## 2020-05-31 MED ORDER — SPOT INK MARKER SYRINGE KIT
PACK | SUBMUCOSAL | Status: AC
  Filled 2020-05-31: qty 10

## 2020-05-31 MED ORDER — LIDOCAINE 2% (20 MG/ML) 5 ML SYRINGE
INTRAMUSCULAR | Status: DC | PRN
  Administered 2020-05-31: 40 mg via INTRAVENOUS

## 2020-05-31 MED ORDER — SODIUM CHLORIDE 0.9 % IV SOLN: INTRAVENOUS | Status: DC

## 2020-05-31 SURGICAL SUPPLY — 22 items

## 2020-05-31 NOTE — Progress Notes (Signed)
Patient completed 3/4 of second bowel prep. Has 8 more oz left and patient says she cannot drink anymore. Will continue to monitor.

## 2020-05-31 NOTE — Op Note (Signed)
[facility] Patient Name: Morgan Clay Procedure Date : 05/31/2020 MRN: [id number] Attending MD: Docia Chuck. Henrene Pastor , MD Date of Birth: March 10, 1946 CSN: [id number] Age: 75 Admit Type: Inpatient Procedure:                Colonoscopy with control of bleeding (APC ileal                            AVM); submucosal injection Indications:              Hematochezia. Capsule endoscopy performed 2 days                            ago revealed active bleeding in the distal small                            bowel, questionable terminal ileum. Now for                            evaluation Providers:                Docia Chuck. Henrene Pastor, MD, Nelia Shi, RN,                            Doristine Johns, RN, Tyna Jaksch Technician Referring MD:             [facility] Medicines:                Monitored Anesthesia Care Complications:            No immediate complications. Estimated blood loss:                            None. Estimated Blood Loss:     Estimated blood loss: none. Procedure:                Pre-Anesthesia Assessment:                           - Prior to the procedure, a History and Physical                            was performed, and patient medications and                            allergies were reviewed. The patient's tolerance of                            previous anesthesia was also reviewed. The risks                            and benefits of the procedure and the sedation                            options and risks were discussed with the patient.  All questions were answered, and informed consent                            was obtained. Prior Anticoagulants: The patient has                            taken Eliquis (apixaban), last dose was 4 days                            prior to procedure. ASA Grade Assessment: III - A                            patient with severe systemic disease. After                            reviewing  the risks and benefits, the patient was                            deemed in satisfactory condition to undergo the                            procedure.                           After obtaining informed consent, the colonoscope                            was passed under direct vision. Throughout the                            procedure, the patient's blood pressure, pulse, and                            oxygen saturations were monitored continuously. The                            CF-HQ190L ([id number]) Olympus colonoscope was                            introduced through the anus and advanced to the 60                            cm into the ileum. The terminal ileum, ileocecal                            valve, appendiceal orifice, and rectum were                            photographed. The quality of the bowel preparation                            was excellent. The colonoscopy was performed  without difficulty. The patient tolerated the                            procedure well. The bowel preparation used was                            MoviPrep via split dose instruction.                           NOTE: Video capture for photodocumentation                            malfunction intermittently with some intended                            images not saved Scope In: 11:09:28 AM Scope Out: 11:39:56 AM Scope Withdrawal Time: 0 hours 26 minutes 23 seconds  Total Procedure Duration: 0 hours 30 minutes 28 seconds  Findings:      The distal ileum contained a single small angiodysplastic lesion without       bleeding. Coagulation for bleeding prevention using argon beam was       successful.      The terminal ileum was easily and deeply intubated for a distance of 60       cm. This was carefully examined and appeared normal. There was no blood       or active bleeding to be found. The most proximal extent of the       examination was tattooed with an injection of 3  mL of Spot (carbon       black).      A few small-mouthed diverticula were found in the sigmoid colon.. There       was suture material in the rectum consistent with prior resection.      The exam was otherwise without abnormality on direct and retroflexion       views. Impression:               - A single tiny non-bleeding angiodysplastic                            appearing lesion in the ileum. Treated with argon                            beam coagulation. It is not clear to me that this                            was the culprit for the significant bleeding noted                            on capsule endoscopy. However, it was the only                            lesion noted, thus treated.                           - The examined portion of the ileum was normal for  an extent of 60 cm. Tattoo placed at the proximal                            extent of the exam.                           - Diverticulosis in the sigmoid colon.                           - Status post prior rectosigmoid resection                           - The examination was otherwise normal on direct                            and retroflexion views.                           - No active bleeding. Recommendation:           1. Resume previous diet.                           2. Continue present medications.                           3. Okay to resume Eliquis if medically important                           4. Observe for any evidence of recurrent bleeding                           5. Okay for discharge home in a.m. if no further                            evidence of bleeding                           6. If the patient were to develop recurrent                            clinically significant bleeding, I would repeat                            capsule endoscopy and see the relationship between                            identified area of bleeding and tattoo placed on                             today's examination                           Findings reviewed with the patient. She was given a  copy of her procedure report as well Procedure Code(s):        --- Professional ---                           [phone], Colonoscopy, flexible; with control of                            bleeding, any method                           45381, 59, Colonoscopy, flexible; with directed                            submucosal injection(s), any substance Diagnosis Code(s):        --- Professional ---                           K55.20, Angiodysplasia of colon without hemorrhage                           K92.1, Melena (includes Hematochezia)                           K57.30, Diverticulosis of large intestine without                            perforation or abscess without bleeding CPT copyright 2019 American Medical Association. All rights reserved. The codes documented in this report are preliminary and upon coder review may  be revised to meet current compliance requirements. Docia Chuck. Henrene Pastor, MD 05/31/2020 12:07:32 PM This report has been signed electronically. Number of Addenda: 0

## 2020-05-31 NOTE — Interval H&P Note (Signed)
History and Physical Interval Note:  05/31/2020 10:39 AM  Morgan Clay  has presented today for surgery, with the diagnosis of anemia    gi bleeding.  The various methods of treatment have been discussed with the patient and family. After consideration of risks, benefits and other options for treatment, the patient has consented to  Procedure(s): COLONOSCOPY WITH PROPOFOL (N/A) as a surgical intervention.  The patient's history has been reviewed, patient examined, no change in status, stable for surgery.  I have reviewed the patient's chart and labs.  Questions were answered to the patient's satisfaction.     Morgan Clay

## 2020-05-31 NOTE — Progress Notes (Signed)
PROGRESS NOTE  Morgan Clay HFW:[id number] DOB: 11-22-1945 DOA: 05/27/2020 PCP: Patient, No Pcp Per  HPI/Recap of past 24 hours:  Morgan Clay is a 75 y.o. female with history of chronic anemia and GI bleed who had a recent capsule endoscopy and it was not sure if it left the stomach.  Was advised to come to the ER.  On arrival to the ER patient complained of shortness of breath and chronic melanotic stools.  Denies any abdominal pain nausea vomiting or chest pain.  Denies any fever or chills.  ED Course: The ER KUB did not show any features for capsule endoscopy.  Did show some concerning features for bowel obstruction but patient denies any nausea vomiting last bowel movement was around 3 days ago.  On presentation, labs were significant for hemoglobin of 5.2, positive FOBT, INR was 2.1 creatinine 2.8.  Patient was admitted for acute on chronic GI bleed.  Patient has had a recent COVID test positive on December 27, 21.  Patient is asymptomatic.  Post EGD on 05/29/2020 by Dr. Loletha Carrow.  Post colonoscopy on 05/31/2020 by Dr. Henrene Pastor:  - A single tiny non-bleeding angiodysplastic appearing lesion in the ileum. Treated with argon beam coagulation. It is not clear to me that this was the culprit for the significant bleeding noted on capsule endoscopy. However, it was the only lesion noted, thus treated. - The examined portion of the ileum was normal for an extent of 60 cm. Tattoo placed at the proximal extent of the exam. - Diverticulosis in the sigmoid colon. - Status post prior rectosigmoid resection - The examination was otherwise normal on direct and retroflexion views. - No active bleeding.  05/31/20: Seen and examined at bedside.  No new complaints.  We will continue to monitor for any evidence of active bleeding.  She was on Eliquis for A. fib, okay to restart per GI.  Assessment/Plan: Principal Problem:   Acute GI bleeding Active Problems:   Hypothyroidism   OBSTRUCTIVE SLEEP  APNEA   Chronic diastolic CHF (congestive heart failure) (HCC)   Nonischemic cardiomyopathy (HCC)   Type 2 diabetes, controlled, with renal manifestation (HCC)   CKD (chronic kidney disease) stage 4, GFR 15-29 ml/min    Atrial fibrillation, chronic   AVM (arteriovenous malformation) of small bowel, acquired   Anemia   Acute blood loss anemia   COVID-19 virus infection  Acute on chronic GI bleed She was recently seen by GI, she had capsule endoscopy.  It was unclear whether or not she had passed it. Presented with hemoglobin of 5.2 with positive FOBT 2 unit PRBCs were ordered to be transfused Repeated hemoglobin 8.7 from 8.3 from 8.2 from 6.6 Continue to hold off home oral anticoagulation, Eliquis. Patient denies any overt bleeding Continue to closely monitor H&H She is currently undergoing bowel prep. [facility] consulted by admitting physician Post EGD by Dr. Loletha Carrow on 05/29/2020, successful completion of the video capsule enteroscope placement, results will be read on 05/30/2020. Post colonoscopy inpatient due to underlying comorbidities, findings as stated above. Repeat CBC in the morning  Paroxysmal A. fib/A-flutter She is on Eliquis at home, her last dose was on 05/27/2020 Okay to resume Eliquis per GI post colonoscopy on 05/31/2020. Continue low-dose of Coreg to avoid beta-blockade withdrawal Continue amiodarone at lower dose 100 mg daily  Acute blood loss anemia in the setting of suspected upper GI bleed Management as stated above Completed IV PPI twice daily, switched to oral Protonix 40 mg daily on 05/29/2018. Transfuse  hemoglobin less than 7.0  Improving left hip pain post fall, POA Pain control in place along with bowel regimen daily Left hip x-ray no evidence of acute process or fracture Continue fall precautions  Essential hypertension BP meds on hold except for amiodarone and Coreg due to history of A. fib/A.  Flutter. Continue to closely monitor blood pressure and  maintain MAP greater than 65  Chronic diastolic CHF Euvolemic on exam Last 2D echo done on 12/29/2019 showed normal LVEF 50 to 55% with grade 2 diastolic dysfunction Continue strict I's and O's and daily weight Net I&O +2.2 L  Hypothyroidism Continue home levothyroxine       Code Status: Full code  Family Communication: None at bedside.  Disposition Plan: Likely will discharge to home on 06/01/2020, with home health PT OT if no evidence of ongoing active bleeding.   Consultants:  GI  Procedures: None  Antimicrobials:  None  DVT prophylaxis: SCDs  Status is: Inpatient   Dispo:  Patient From: Home  Planned Disposition: Home with home health services.  Expected discharge date: 06/01/20 Medically stable for discharge: No, ongoing management of suspected upper GI bleed, acute blood loss, anemia requiring blood transfusion, plan colonoscopy inpatient due to underlying comorbidities.         Objective: Vitals:   05/31/20 1145 05/31/20 1155 05/31/20 1206 05/31/20 1234  BP: (!) 103/37 (!) 105/49 111/83 (!) 127/55  Pulse:   63 60  Resp:   15 16  Temp: 97.6 F (36.4 C)     TempSrc: Oral     SpO2:  92% 97% 100%  Weight:      Height:        Intake/Output Summary (Last 24 hours) at 05/31/2020 1652 Last data filed at 05/31/2020 1145 Gross per 24 hour  Intake 400 ml  Output -  Net 400 ml   Filed Weights   05/27/20 1344 05/31/20 0955  Weight: 63.5 kg 63.5 kg    Exam:  . General: 75 y.o. year-old female well-developed well-nourished no acute distress.  Alert and oriented x3.   . Cardiovascular: Regular rate and rhythm no rubs or gallops. [name] Respiratory: Clear to auscultation no wheezes or rales. .  Abdomen: Soft nontender normal bowel sounds present.  . Musculoskeletal: No lower extremity edema bilaterally. [name] Psychiatry: Mood is appropriate for condition and setting.    Data Reviewed: CBC: Recent Labs  Lab 05/27/20 1357 05/28/20 0230 05/28/20 1153  05/29/20 0311 05/30/20 0213 05/31/20 0401  WBC 7.1 6.4  --  6.0 7.4 7.0  HGB 5.2* 6.8* 6.6* 8.2* 8.3* 8.7*  HCT 16.8* 20.3* 20.7* 24.6* 26.1* 28.4*  MCV 93.9 91.9  --  90.4 91.9 93.7  PLT 217 171  --  171 184 825   Basic Metabolic Panel: Recent Labs  Lab 05/27/20 1038 05/27/20 1357 05/28/20 0230 05/29/20 0311  NA 138 137 139 134*  K 4.5 4.5 4.0 3.7  CL 106 104 107 103  CO2 23 20* 20* 20*  GLUCOSE 105* 120* 86 107*  BUN 67* 66* 65* 63*  CREATININE 2.78* 2.89* 2.90* 2.79*  CALCIUM 9.0 9.0 8.7* 8.7*  MG  --   --   --  2.4   GFR: Estimated Creatinine Clearance: 14.8 mL/min (A) (by C-G formula based on SCr of 2.79 mg/dL (H)). Liver Function Tests: Recent Labs  Lab 05/27/20 1357  AST 18  ALT 14  ALKPHOS 75  BILITOT 0.7  PROT 7.0  ALBUMIN 3.6   No results for input(s):  LIPASE, AMYLASE in the last 168 hours. No results for input(s): AMMONIA in the last 168 hours. Coagulation Profile: Recent Labs  Lab 05/27/20 1843  INR 2.1*   Cardiac Enzymes: No results for input(s): CKTOTAL, CKMB, CKMBINDEX, TROPONINI in the last 168 hours. BNP (last 3 results) Recent Labs    07/17/19 1352 07/23/19 1033  PROBNP 811.0* 637.0*   HbA1C: No results for input(s): HGBA1C in the last 72 hours. CBG: Recent Labs  Lab 05/28/20 1149 05/28/20 1709 05/28/20 1921 05/29/20 0058 05/29/20 0420  GLUCAP 92 96 134* 126* 99   Lipid Profile: No results for input(s): CHOL, HDL, LDLCALC, TRIG, CHOLHDL, LDLDIRECT in the last 72 hours. Thyroid Function Tests: No results for input(s): TSH, T4TOTAL, FREET4, T3FREE, THYROIDAB in the last 72 hours. Anemia Panel: No results for input(s): VITAMINB12, FOLATE, FERRITIN, TIBC, IRON, RETICCTPCT in the last 72 hours. Urine analysis:    Component Value Date/Time   COLORURINE YELLOW 03/05/2018 0902   APPEARANCEUR CLEAR 03/05/2018 0902   LABSPEC 1.008 03/05/2018 0902   PHURINE 5.0 03/05/2018 0902   GLUCOSEU NEGATIVE 03/05/2018 0902   HGBUR  NEGATIVE 03/05/2018 0902   HGBUR negative 02/28/2008 0000   BILIRUBINUR NEGATIVE 03/05/2018 0902   KETONESUR 5 (A) 03/05/2018 0902   PROTEINUR NEGATIVE 03/05/2018 0902   UROBILINOGEN 1.0 07/19/2010 1752   NITRITE NEGATIVE 03/05/2018 0902   LEUKOCYTESUR NEGATIVE 03/05/2018 0902   Sepsis Labs: @LABRCNTIP (procalcitonin:4,lacticidven:4)  ) Recent Results (from the past 240 hour(s))  SARS Coronavirus 2 by RT PCR (hospital order, performed in [place] hospital lab) Nasopharyngeal Nasopharyngeal Swab     Status: None   Collection Time: 05/27/20  8:08 PM   Specimen: Nasopharyngeal Swab  Result Value Ref Range Status   SARS Coronavirus 2 NEGATIVE NEGATIVE Final    Comment: (NOTE) SARS-CoV-2 target nucleic acids are NOT DETECTED.  The SARS-CoV-2 RNA is generally detectable in upper and lower respiratory specimens during the acute phase of infection. The lowest concentration of SARS-CoV-2 viral copies this assay can detect is 250 copies / mL. A negative result does not preclude SARS-CoV-2 infection and should not be used as the sole basis for treatment or other patient management decisions.  A negative result may occur with improper specimen collection / handling, submission of specimen other than nasopharyngeal swab, presence of viral mutation(s) within the areas targeted by this assay, and inadequate number of viral copies (<250 copies / mL). A negative result must be combined with clinical observations, patient history, and epidemiological information.  Fact Sheet for Patients:   StrictlyIdeas.no  Fact Sheet for Healthcare Providers: BankingDealers.co.za  This test is not yet approved or  cleared by the [place] FDA and has been authorized for detection and/or diagnosis of SARS-CoV-2 by FDA under an Emergency Use Authorization (EUA).  This EUA will remain in effect (meaning this test can be used) for the duration of  the COVID-19 declaration under Section 564(b)(1) of the Act, 21 U.S.C. section 360bbb-3(b)(1), unless the authorization is terminated or revoked sooner.  Performed at [facility], [place] [address] [place], [place] [address]       Studies: No results found.  Scheduled Meds: . amiodarone  100 mg Oral Daily  . carvedilol  3.125 mg Oral BID  . levothyroxine  137 mcg Oral QAC breakfast  . pantoprazole  40 mg Oral Daily    Continuous Infusions: . sodium chloride 10 mL/hr at 05/31/20 1102     LOS: 4 days     Lorenda Cahill  [place] Crane, MD [facility] Pager [phone]  If 7PM-7AM, please contact night-coverage [email or web address] Password [facility] 05/31/2020, 4:52 PM

## 2020-05-31 NOTE — Transfer of Care (Signed)
Immediate Anesthesia Transfer of Care Note  Patient: Erilyn Pearman  Procedure(s) Performed: COLONOSCOPY WITH PROPOFOL (N/A ) SUBMUCOSAL TATTOO INJECTION HOT HEMOSTASIS (ARGON PLASMA COAGULATION/BICAP) (N/A )  Patient Location: Endoscopy Unit  Anesthesia Type:MAC  Level of Consciousness: sedated and responds to stimulation  Airway & Oxygen Therapy: Patient Spontanous Breathing and Patient connected to face mask oxygen  Post-op Assessment: Report given to RN, Post -op Vital signs reviewed and stable and Patient moving all extremities  Post vital signs: Reviewed and stable  Last Vitals:  Vitals Value Taken Time  BP    Temp    Pulse 59 05/31/20 1145  Resp 17 05/31/20 1145  SpO2 100 % 05/31/20 1145  Vitals shown include unvalidated device data.  Last Pain:  Vitals:   05/31/20 0955  TempSrc: Oral  PainSc: 0-No pain      Patients Stated Pain Goal: 0 (16/38/46 6599)  Complications: No complications documented.

## 2020-05-31 NOTE — Anesthesia Preprocedure Evaluation (Addendum)
Anesthesia Evaluation  Patient identified by MRN, date of birth, ID band Patient awake    Reviewed: Allergy & Precautions, NPO status , Patient's Chart, lab work & pertinent test results, reviewed documented beta blocker date and time   Airway Mallampati: I  TM Distance: >3 FB Neck ROM: Full    Dental  (+) Poor Dentition, Missing, Dental Advisory Given,    Pulmonary asthma , sleep apnea (does not use CPAP) , COPD,    Pulmonary exam normal breath sounds clear to auscultation       Cardiovascular hypertension, Pt. on home beta blockers and Pt. on medications +CHF  Normal cardiovascular exam+ dysrhythmias (on eliquis) Atrial Fibrillation + Cardiac Defibrillator + Valvular Problems/Murmurs AI  Rhythm:Regular Rate:Normal  '21 TTE - EF 50 to 55%. Moderate left ventricular hypertrophy. Grade II diastolic dysfunction (pseudonormalization). Right ventricular systolic function is mildly reduced. Left atrial size was severely dilated. Mild mitral valve regurgitation. Aortic valve regurgitation is moderate.   R/LHC 2021 1. Normal left and right heart filling pressures.  2. Mild pulmonary hypertension.  3. Preserved cardiac output.  4. Mild nonobstructive coronary disease.  5. CKD, used 20 cc contrast.    Neuro/Psych negative neurological ROS  negative psych ROS   GI/Hepatic GERD  Medicated,(+) Hepatitis -, B, C  Endo/Other  diabetesHypothyroidism   Renal/GU Renal InsufficiencyRenal disease (Cr 2.79)  negative genitourinary   Musculoskeletal negative musculoskeletal ROS (+)   Abdominal   Peds  Hematology  (+) Blood dyscrasia (Hgb 8.7), anemia ,   Anesthesia Other Findings   Reproductive/Obstetrics                            Anesthesia Physical Anesthesia Plan  ASA: III  Anesthesia Plan: MAC   Post-op Pain Management:    Induction: Intravenous  PONV Risk Score and Plan: Propofol infusion and  Treatment may vary due to age or medical condition  Airway Management Planned: Natural Airway  Additional Equipment:   Intra-op Plan:   Post-operative Plan:   Informed Consent: I have reviewed the patients History and Physical, chart, labs and discussed the procedure including the risks, benefits and alternatives for the proposed anesthesia with the patient or authorized representative who has indicated his/her understanding and acceptance.     Dental advisory given  Plan Discussed with: CRNA  Anesthesia Plan Comments:         Anesthesia Quick Evaluation

## 2020-05-31 NOTE — Plan of Care (Signed)

## 2020-05-31 NOTE — Progress Notes (Signed)
PT Cancellation Note  Patient Details Name: Nell Gales MRN: [id number] DOB: [birth year]   Cancelled Treatment:    Reason Eval/Treat Not Completed: Patient at procedure or test/unavailable   At colonoscopy currently;   Will continue to follow;   Roney Marion, [place] Pager [phone] Office [phone]    Colletta [place] 05/31/2020, 10:38 AM

## 2020-05-31 NOTE — Anesthesia Postprocedure Evaluation (Signed)
Anesthesia Post Note  Patient: Morgan Clay  Procedure(s) Performed: COLONOSCOPY WITH PROPOFOL (N/A ) SUBMUCOSAL TATTOO INJECTION HOT HEMOSTASIS (ARGON PLASMA COAGULATION/BICAP) (N/A )     Patient location during evaluation: PACU Anesthesia Type: MAC Level of consciousness: awake and alert Pain management: pain level controlled Vital Signs Assessment: post-procedure vital signs reviewed and stable Respiratory status: spontaneous breathing, nonlabored ventilation and respiratory function stable Cardiovascular status: stable and blood pressure returned to baseline Anesthetic complications: no   No complications documented.  Last Vitals:  Vitals:   05/31/20 1206 05/31/20 1234  BP: 111/83 (!) 127/55  Pulse: 63 60  Resp: 15 16  Temp:    SpO2: 97% 100%                  Audry Pili

## 2020-05-31 NOTE — Progress Notes (Signed)
Physical Therapy Treatment Patient Details Name: Morgan Clay MRN: [id number] DOB: [birth year] Today's Date: 05/31/2020    History of Present Illness Patient is a 75 y/o female with PMH of compensated cirrhosis of liver, chronic anemia and GI bleed, CHF, DM II, CKD, chronic Afib. Presented to ED with B LE weakness with multiple falls and Hgb of 5.0    PT Comments    Continuing work on functional mobility and activity tolerance;  Seen post colonoscopy, and Morgan Clay was motivated to get moving (she hopes to dc soon); Session focused on progressive amb, and using the Rollator RW to give her the opportunity to sit and rest when needed; walked in the hallway; fatigued post amb; On track for dc home soon -- possibly tomorrow   Follow Up Recommendations  Home health PT;Supervision for mobility/OOB     Equipment Recommendations  3in1 (PT)    Recommendations for Other Services       Precautions / Restrictions Precautions Precautions: Fall    Mobility  Bed Mobility Overal bed mobility: Needs Assistance Bed Mobility: Supine to Sit     Supine to sit: Supervision     General bed mobility comments: supervision for safety  Transfers Overall transfer level: Needs assistance Equipment used: 4-wheeled walker Transfers: Sit to/from Stand Sit to Stand: Min guard         General transfer comment: min guard for safety with very slow movement  Ambulation/Gait Ambulation/Gait assistance: Min guard Gait Distance (Feet): 60 Feet Assistive device: 4-wheeled walker Gait Pattern/deviations: Step-through pattern;Decreased step length - right;Decreased step length - left;Decreased stride length     General Gait Details: Slow pace with  good use of Rollator RW for UE support and steadiness; notably decr gait speed with incr distance, indicative of fatigue   Stairs             Wheelchair Mobility    Modified Rankin (Stroke Patients Only)       Balance     Sitting  balance-Leahy Scale: Good       Standing balance-Leahy Scale: Fair                              Cognition Arousal/Alertness: Awake/alert Behavior During Therapy: WFL for tasks assessed/performed Overall Cognitive Status: Within Functional Limits for tasks assessed                                        Exercises      General Comments        Pertinent Vitals/Pain Pain Assessment: Faces Faces Pain Scale: Hurts Morgan more Pain Location: L hip Pain Descriptors / Indicators: Aching;Grimacing;Guarding Pain Intervention(s): Monitored during session    Home Living                      Prior Function            PT Goals (current goals can now be found in the care plan section) Acute Rehab PT Goals Patient Stated Goal: to go home PT Goal Formulation: With patient Time For Goal Achievement: 06/12/20 Potential to Achieve Goals: Good Progress towards PT goals: Progressing toward goals    Frequency    Min 3X/week      PT Plan Current plan remains appropriate    Co-evaluation  AM-PAC PT "6 Clicks" Mobility   Outcome Measure  Help needed turning from your back to your side while in a flat bed without using bedrails?: A Morgan Help needed moving from lying on your back to sitting on the side of a flat bed without using bedrails?: A Morgan Help needed moving to and from a bed to a chair (including a wheelchair)?: A Morgan Help needed standing up from a chair using your arms (e.g., wheelchair or bedside chair)?: A Morgan Help needed to walk in hospital room?: A Morgan Help needed climbing 3-5 steps with a railing? : A Lot 6 Click Score: 17    End of Session Equipment Utilized During Treatment: Gait belt Activity Tolerance: Patient tolerated treatment well Patient left: in chair;with call bell/phone within reach;with family/visitor present Nurse Communication: Mobility status PT Visit Diagnosis: Unsteadiness on  feet (R26.81);Other abnormalities of gait and mobility (R26.89);Muscle weakness (generalized) (M62.81);Repeated falls (R29.6)     Time: 1335-1400 PT Time Calculation (min) (ACUTE ONLY): 25 min  Charges:  $Gait Training: 23-37 mins                     Roney Marion, [place]  Acute Rehabilitation Services Pager [phone] Office [place] 05/31/2020, 6:07 PM

## 2020-06-01 DIAGNOSIS — I4892 Unspecified atrial flutter: Secondary | ICD-10-CM

## 2020-06-01 DIAGNOSIS — I428 Other cardiomyopathies: Secondary | ICD-10-CM

## 2020-06-01 DIAGNOSIS — I5043 Acute on chronic combined systolic (congestive) and diastolic (congestive) heart failure: Secondary | ICD-10-CM

## 2020-06-01 DIAGNOSIS — K922 Gastrointestinal hemorrhage, unspecified: Secondary | ICD-10-CM | POA: Diagnosis not present

## 2020-06-01 LAB — BASIC METABOLIC PANEL
Anion gap: 12 (ref 5–15)
BUN: 26 mg/dL — ABNORMAL HIGH (ref 8–23)
CO2: 18 mmol/L — ABNORMAL LOW (ref 22–32)
Calcium: 9 mg/dL (ref 8.9–10.3)
Chloride: 108 mmol/L (ref 98–111)
Creatinine, Ser: 1.9 mg/dL — ABNORMAL HIGH (ref 0.44–1.00)
GFR, Estimated: 28 mL/min — ABNORMAL LOW (ref >60)
Glucose, Bld: 96 mg/dL (ref 70–99)
Potassium: 4.2 mmol/L (ref 3.5–5.1)
Sodium: 138 mmol/L (ref 135–145)

## 2020-06-01 LAB — CBC
HCT: 25.1 % — ABNORMAL LOW (ref 36.0–46.0)
Hemoglobin: 8.3 g/dL — ABNORMAL LOW (ref 12.0–15.0)
MCH: 30 pg (ref 26.0–34.0)
MCHC: 33.1 g/dL (ref 30.0–36.0)
MCV: 90.6 fL (ref 80.0–100.0)
Platelets: 199 10*3/uL (ref 150–400)
RBC: 2.77 MIL/uL — ABNORMAL LOW (ref 3.87–5.11)
RDW: 17.8 % — ABNORMAL HIGH (ref 11.5–15.5)
WBC: 6.9 10*3/uL (ref 4.0–10.5)
nRBC: 0 % (ref 0.0–0.2)

## 2020-06-01 MED ORDER — TORSEMIDE 20 MG PO TABS
40.0000 mg | ORAL_TABLET | Freq: Every day | ORAL | Status: DC
  Administered 2020-06-02 – 2020-06-03 (×2): 40 mg via ORAL
  Filled 2020-06-01 (×2): qty 2

## 2020-06-01 MED ORDER — FUROSEMIDE 10 MG/ML IJ SOLN
40.0000 mg | Freq: Once | INTRAMUSCULAR | Status: AC
  Administered 2020-06-01: 40 mg via INTRAVENOUS
  Filled 2020-06-01: qty 4

## 2020-06-01 MED ORDER — ISOSORB DINITRATE-HYDRALAZINE 20-37.5 MG PO TABS
1.0000 | ORAL_TABLET | Freq: Three times a day (TID) | ORAL | Status: DC
  Administered 2020-06-01 – 2020-06-03 (×6): 1 via ORAL
  Filled 2020-06-01 (×9): qty 1

## 2020-06-01 MED ORDER — TORSEMIDE 20 MG PO TABS
40.0000 mg | ORAL_TABLET | Freq: Every day | ORAL | Status: DC
  Filled 2020-06-01: qty 2

## 2020-06-01 MED ORDER — FUROSEMIDE 10 MG/ML IJ SOLN: 20.0000 mg | Freq: Once | INTRAMUSCULAR | Status: DC

## 2020-06-01 NOTE — Progress Notes (Signed)
PT Cancellation Note  Patient Details Name: Morgan Clay MRN: [id number] DOB: Jul 01, 1945   Cancelled Treatment:    Reason Eval/Treat Not Completed: Other (comment)   Politely declining PT; Is frustrated that she feels she hasn't gotten a definitive diagnosis;  Will continue to follow;   Roney Marion, [place] Pager [phone] Office [phone]    Colletta [place] 06/01/2020, 2:52 PM

## 2020-06-01 NOTE — Progress Notes (Signed)
PROGRESS NOTE  Cherilynn Schomburg QQP:[id number] DOB: 09-17-1945 DOA: 05/27/2020 PCP: Patient, No Pcp Per  HPI/Recap of past 24 hours:  Carnita Golob is a 75 y.o. female with history of chronic anemia and GI bleed who had a recent capsule endoscopy and it was not sure if it left the stomach.  Was advised to come to the ER.  On arrival to the ER patient complained of shortness of breath and chronic melanotic stools.  Denies any abdominal pain nausea vomiting or chest pain.  Denies any fever or chills.  ED Course: The ER KUB did not show any features for capsule endoscopy.  Did show some concerning features for bowel obstruction but patient denies any nausea vomiting last bowel movement was around 3 days ago.  On presentation, labs were significant for hemoglobin of 5.2, positive FOBT, INR was 2.1 creatinine 2.8.  Patient was admitted for acute on chronic GI bleed.  Patient has had a recent COVID test positive on December 27, 21.  Patient is asymptomatic.  Post EGD on 05/29/2020 by Dr. Loletha Carrow.  Post colonoscopy on 05/31/2020 by Dr. Henrene Pastor:  - A single tiny non-bleeding angiodysplastic appearing lesion in the ileum. Treated with argon beam coagulation. It is not clear to me that this was the culprit for the significant bleeding noted on capsule endoscopy. However, it was the only lesion noted, thus treated. - The examined portion of the ileum was normal for an extent of 60 cm. Tattoo placed at the proximal extent of the exam. - Diverticulosis in the sigmoid colon. - Status post prior rectosigmoid resection - The examination was otherwise normal on direct and retroflexion views. - No active bleeding.  06/01/20: No new complaints this morning.  No bowel movements.  Seen by GI.  Cardiology consulted for recommendations regarding anticoagulation.  Cardiology recommended to continue to hold Eliquis.  Assessment/Plan: Principal Problem:   Acute GI bleeding Active Problems:   Hypothyroidism    OBSTRUCTIVE SLEEP APNEA   Chronic diastolic CHF (congestive heart failure) (HCC)   Nonischemic cardiomyopathy (HCC)   Type 2 diabetes, controlled, with renal manifestation (HCC)   CKD (chronic kidney disease) stage 4, GFR 15-29 ml/min    Atrial fibrillation, chronic   AVM (arteriovenous malformation) of small bowel, acquired   Anemia   Acute blood loss anemia   COVID-19 virus infection  Acute on chronic GI bleed She was recently seen by GI, she had capsule endoscopy.  It was unclear whether or not she had passed it. Presented with hemoglobin of 5.2K with positive FOBT Received 2 units PRBCs. Repeated hemoglobin 8.3K from 8.7K Continue to hold off home oral anticoagulation, Eliquis, as recommended by cardiology. Post EGD by Dr. Loletha Carrow on 05/29/2020, successful completion of the video capsule enteroscope placement, results read on 05/30/2020, evidence of bleeding was noted. Post colonoscopy inpatient due to underlying comorbidities, findings as stated above. Mild drop of hemoglobin this morning from 8.7-8.3K. Continue to monitor H&H  Paroxysmal A. fib/A-flutter She is on Eliquis at home, her last dose was on 05/27/2020 Hold off Eliquis per cardiology. Continue low-dose of Coreg to avoid beta-blockade withdrawal Continue amiodarone at lower dose 100 mg daily  Acute blood loss anemia in the setting of suspected upper GI bleed Management as stated above Completed IV PPI twice daily, switched to oral Protonix 40 mg daily on 05/29/2020. Transfuse hemoglobin less than 7.0  Improving left hip pain post fall, POA Pain control in place along with bowel regimen daily Left hip x-ray no evidence of  acute process or fracture Continue fall precautions  Essential hypertension Currently on amiodarone 100 mg daily, Coreg 3.125 mg twice daily, BIDIL 1 tablet 3 times daily, and torsemide 40 mg daily. Continue to closely monitor blood pressure Maintain MAP greater than 65 if able. Cardiology  following.  Chronic diastolic CHF Euvolemic on exam Last 2D echo done on 12/29/2019 showed normal LVEF 50 to 55% with grade 2 diastolic dysfunction Home torsemide 40 mg daily restarted on 06/01/2020 by cardiology Continue strict I's and O's and daily weight Net I&O +2.2 L  Hypothyroidism Continue home levothyroxine       Code Status: Full code  Family Communication: None at bedside.  Disposition Plan: Likely will discharge to home on 06/02/2020, with home health PT OT if no evidence of ongoing active bleeding.   Consultants:  GI  Procedures: None  Antimicrobials:  None  DVT prophylaxis: SCDs  Status is: Inpatient   Dispo:  Patient From: Home  Planned Disposition: Home with Health Care Svc with home health services.  Expected discharge date: 06/02/20 Medically stable for discharge: No, ongoing management of suspected upper GI bleed, acute blood loss, anemia requiring blood transfusion.        Objective: Vitals:   05/31/20 2137 06/01/20 0620 06/01/20 0822 06/01/20 1419  BP: (!) 159/66 (!) 160/60 (!) 176/67 (!) 151/66  Pulse: 71 66 66 66  Resp: 18 17 16 18   Temp: 97.6 F (36.4 C)  97.7 F (36.5 C) 98.9 F (37.2 C)  TempSrc: Oral  Oral Oral  SpO2:   100% 100%  Weight:      Height:       No intake or output data in the 24 hours ending 06/01/20 1510 Filed Weights   05/27/20 1344 05/31/20 0955  Weight: 63.5 kg 63.5 kg    Exam:  . General: 75 y.o. year-old female pleasant well-developed well-nourished no acute stress.  Alert and oriented x3.  . Cardiovascular: Regular rate and rhythm no rubs or gallops.  [name] Respiratory: Clear to auscultation no wheeze or rales .  Abdomen: Soft nontender normal bowel sounds present. . Musculoskeletal: No extremity edema bilaterally. [name] Psychiatry: Mood is appropriate for condition and setting.  Data Reviewed: CBC: Recent Labs  Lab 05/28/20 0230 05/28/20 1153 05/29/20 0311 05/30/20 0213 05/31/20 0401  06/01/20 0711  WBC 6.4  --  6.0 7.4 7.0 6.9  HGB 6.8* 6.6* 8.2* 8.3* 8.7* 8.3*  HCT 20.3* 20.7* 24.6* 26.1* 28.4* 25.1*  MCV 91.9  --  90.4 91.9 93.7 90.6  PLT 171  --  171 184 204 564   Basic Metabolic Panel: Recent Labs  Lab 05/27/20 1038 05/27/20 1357 05/28/20 0230 05/29/20 0311 06/01/20 0711  NA 138 137 139 134* 138  K 4.5 4.5 4.0 3.7 4.2  CL 106 104 107 103 108  CO2 23 20* 20* 20* 18*  GLUCOSE 105* 120* 86 107* 96  BUN 67* 66* 65* 63* 26*  CREATININE 2.78* 2.89* 2.90* 2.79* 1.90*  CALCIUM 9.0 9.0 8.7* 8.7* 9.0  MG  --   --   --  2.4  --    GFR: Estimated Creatinine Clearance: 21.7 mL/min (A) (by C-G formula based on SCr of 1.9 mg/dL (H)). Liver Function Tests: Recent Labs  Lab 05/27/20 1357  AST 18  ALT 14  ALKPHOS 75  BILITOT 0.7  PROT 7.0  ALBUMIN 3.6   No results for input(s): LIPASE, AMYLASE in the last 168 hours. No results for input(s): AMMONIA in the last 168  hours. Coagulation Profile: Recent Labs  Lab 05/27/20 1843  INR 2.1*   Cardiac Enzymes: No results for input(s): CKTOTAL, CKMB, CKMBINDEX, TROPONINI in the last 168 hours. BNP (last 3 results) Recent Labs    07/17/19 1352 07/23/19 1033  PROBNP 811.0* 637.0*   HbA1C: No results for input(s): HGBA1C in the last 72 hours. CBG: Recent Labs  Lab 05/28/20 1149 05/28/20 1709 05/28/20 1921 05/29/20 0058 05/29/20 0420  GLUCAP 92 96 134* 126* 99   Lipid Profile: No results for input(s): CHOL, HDL, LDLCALC, TRIG, CHOLHDL, LDLDIRECT in the last 72 hours. Thyroid Function Tests: No results for input(s): TSH, T4TOTAL, FREET4, T3FREE, THYROIDAB in the last 72 hours. Anemia Panel: No results for input(s): VITAMINB12, FOLATE, FERRITIN, TIBC, IRON, RETICCTPCT in the last 72 hours. Urine analysis:    Component Value Date/Time   COLORURINE YELLOW 03/05/2018 0902   APPEARANCEUR CLEAR 03/05/2018 0902   LABSPEC 1.008 03/05/2018 0902   PHURINE 5.0 03/05/2018 0902   GLUCOSEU NEGATIVE 03/05/2018  0902   HGBUR NEGATIVE 03/05/2018 0902   HGBUR negative 02/28/2008 0000   BILIRUBINUR NEGATIVE 03/05/2018 0902   KETONESUR 5 (A) 03/05/2018 0902   PROTEINUR NEGATIVE 03/05/2018 0902   UROBILINOGEN 1.0 07/19/2010 1752   NITRITE NEGATIVE 03/05/2018 0902   LEUKOCYTESUR NEGATIVE 03/05/2018 0902   Sepsis Labs: @LABRCNTIP (procalcitonin:4,lacticidven:4)  ) Recent Results (from the past 240 hour(s))  SARS Coronavirus 2 by RT PCR (hospital order, performed in [facility] hospital lab) Nasopharyngeal Nasopharyngeal Swab     Status: None   Collection Time: 05/27/20  8:08 PM   Specimen: Nasopharyngeal Swab  Result Value Ref Range Status   SARS Coronavirus 2 NEGATIVE NEGATIVE Final    Comment: (NOTE) SARS-CoV-2 target nucleic acids are NOT DETECTED.  The SARS-CoV-2 RNA is generally detectable in upper and lower respiratory specimens during the acute phase of infection. The lowest concentration of SARS-CoV-2 viral copies this assay can detect is 250 copies / mL. A negative result does not preclude SARS-CoV-2 infection and should not be used as the sole basis for treatment or other patient management decisions.  A negative result may occur with improper specimen collection / handling, submission of specimen other than nasopharyngeal swab, presence of viral mutation(s) within the areas targeted by this assay, and inadequate number of viral copies (<250 copies / mL). A negative result must be combined with clinical observations, patient history, and epidemiological information.  Fact Sheet for Patients:   StrictlyIdeas.no  Fact Sheet for Healthcare Providers: BankingDealers.co.za  This test is not yet approved or  cleared by the [place] FDA and has been authorized for detection and/or diagnosis of SARS-CoV-2 by FDA under an Emergency Use Authorization (EUA).  This EUA will remain in effect (meaning this test can be used) for the duration  of the COVID-19 declaration under Section 564(b)(1) of the Act, 21 U.S.C. section 360bbb-3(b)(1), unless the authorization is terminated or revoked sooner.  Performed at [facility], [place] [address], [place], [place] [address]       Studies: No results found.  Scheduled Meds: . amiodarone  100 mg Oral Daily  . carvedilol  3.125 mg Oral BID  . isosorbide-hydrALAZINE  1 tablet Oral TID  . levothyroxine  137 mcg Oral QAC breakfast  . pantoprazole  40 mg Oral Daily  . torsemide  40 mg Oral Daily    Continuous Infusions: . sodium chloride 10 mL/hr at 05/31/20 1102     LOS: 5 days     Lorenda Cahill  [place] Crane, MD [facility] Pager [phone]  If 7PM-7AM, please contact night-coverage [email or web address] Password [email or web address] 06/01/2020, 3:10 PM

## 2020-06-01 NOTE — Progress Notes (Signed)
HISTORY OF PRESENT ILLNESS:  Morgan Clay is a 75 y.o. female with multiple significant medical problems who has had recurrent problems with GI bleeding in the face of chronic anticoagulation therapy.  Underwent repeat colonoscopy yesterday as the colonoscopy the day previous was compromised by poor prep.  Blood noted throughout the colon, but not the ileum on that particular exam.  Yesterday, the preparation was excellent.  There was no bleeding.  She was found to have a large nonbleeding cecal AVM which was ablated with APC therapy.  She has resumed her diet.  No complaints.  No bowel movements.  Anticoagulation is being resumed.  REVIEW OF SYSTEMS:  All non-GI ROS negative unless otherwise stated in the HPI.  Past Medical History:  Diagnosis Date  . A-fib ([place])    on Eliquis  . AICD (automatic cardioverter/defibrillator) present 10/06/2015  . Anemia   . ARF (acute renal failure) ([place])   . Arthritis    "qwhere" (01/03/2018)  . Asthma    reports mild asthma since childhood - had COPD on dx list from prior PCP  . Chronic bronchitis ([place])    "get it most years; not this past year though" (01/03/2018)  . Chronic kidney disease    "? stage;  followed by [facility]; [name]" (01/03/2018)  . Chronic systolic congestive heart failure ([place])   . COVID-19 05/03/2020  . DEPRESSION 12/17/2009   Annotation: PHQ-9 score = 14 done on 12/17/2009 Qualifier: Diagnosis of  By: Hassell Done FNP, Tori Milks    . Diabetes mellitus without complication (HCC)    DIET CONTROLLED   . Diverticulosis   . Enteritis   . FIBROIDS, UTERUS 03/05/2008  . GI bleeding 01/03/2018  . Glaucoma   . Gout   . Hepatitis C    HEPATITIS C - s/p treatment with Harvoni, saw hepatology, Dawn Drazek  . History of blood transfusion ~ 11/2017  . Hyperlipemia 12/06/2012  . HYPERTENSION, BENIGN 04/24/2007  . Hypothyroidism   . LBBB (left bundle branch block)   . Lower GI bleeding    "been dealing w/it since 07/2017" (01/03/2018)   . Nonischemic cardiomyopathy ([place])   . OBESITY 05/27/2009  . OBSTRUCTIVE SLEEP APNEA 11/14/2007   no CPAP  . OSTEOPENIA 09/30/2008  . Pain and swelling of left upper extremity 08/08/2017  . Personal history of other infectious and parasitic disease    Hepatitis B  . Pneumonia    "several times" (01/03/2018)  . Pulmonary edema, acute ([place]) 08/09/2017    Past Surgical History:  Procedure Laterality Date  . APPENDECTOMY    . CARDIAC CATHETERIZATION    . CARDIOVERSION N/A 12/14/2014   Procedure: CARDIOVERSION;  Surgeon: Dorothy Spark, MD;  Location[facility] ENDOSCOPY;  Service: Cardiovascular;  Laterality: N/A;  . CARDIOVERSION N/A 02/26/2017   Procedure: CARDIOVERSION;  Surgeon: Evans Lance, MD;  Location: [place] CV LAB;  Service: Cardiovascular;  Laterality: N/A;  . CARDIOVERSION N/A 07/24/2017   Procedure: CARDIOVERSION;  Surgeon: Thayer Headings, MD;  Location: [facility] ENDOSCOPY;  Service: Cardiovascular;  Laterality: N/A;  . CARDIOVERSION N/A 02/12/2020   Procedure: CARDIOVERSION;  Surgeon: [name], MD;  Location: [facility] ENDOSCOPY;  Service: Cardiovascular;  Laterality: N/A;  . COLONOSCOPY N/A 11/08/2017   Procedure: COLONOSCOPY;  Surgeon: Milus Banister, MD;  Location: [facility] ENDOSCOPY;  Service: Endoscopy;  Laterality: N/A;  . DILATION AND CURETTAGE OF UTERUS    . ENTEROSCOPY N/A 03/02/2020   Procedure: ENTEROSCOPY;  Surgeon: Yetta Flock, MD;  Location: [facility]  ENDOSCOPY;  Service: Gastroenterology;  Laterality: N/A;  . EP IMPLANTABLE DEVICE N/A 10/06/2015   Procedure: BIV ICD Generator Changeout;  Surgeon: [name], MD;  Location: [place] CV LAB;  Service: Cardiovascular;  Laterality: N/A;  . ESOPHAGOGASTRODUODENOSCOPY N/A 10/26/2017   Procedure: ESOPHAGOGASTRODUODENOSCOPY (EGD);  Surgeon: Ladene Artist, MD;  Location: [facility] ENDOS[facility];  Service: Endoscopy;  Laterality: N/A;  . ESOPHAGOGASTRODUODENOSCOPY (EGD) WITH PROPOFOL N/A 11/07/2017   Procedure:  ESOPHAGOGASTRODUODENOSCOPY (EGD) WITH PROPOFOL;  Surgeon: Milus Banister, MD;  Location: [facility] ENDOSCOPY;  Service: Endoscopy;  Laterality: N/A;  . ESOPHAGOGASTRODUODENOSCOPY (EGD) WITH PROPOFOL N/A 05/29/2020   Procedure: ESOPHAGOGASTRODUODENOSCOPY (EGD) WITH PROPOFOL;  Surgeon: Doran Stabler, MD;  Location: [place];  Service: Gastroenterology;  Laterality: N/A;  . GIVENS CAPSULE STUDY N/A 05/19/2020   Procedure: GIVENS CAPSULE STUDY;  Surgeon: [name], MD;  Location: [place];  Service: Endoscopy;  Laterality: N/A;  .adm for obs since pacemaker, PA wil enter order and see pt  . GIVENS CAPSULE STUDY N/A 05/29/2020   Procedure: GIVENS CAPSULE STUDY;  Surgeon: Doran Stabler, MD;  Location: [place];  Service: Gastroenterology;  Laterality: N/A;  . HOT HEMOSTASIS N/A 10/26/2017   Procedure: HOT HEMOSTASIS (ARGON PLASMA COAGULATION/BICAP);  Surgeon: Ladene Artist, MD;  Location: [facility] ENDOSCOPY;  Service: Endoscopy;  Laterality: N/A;  . HOT HEMOSTASIS N/A 03/02/2020   Procedure: HOT HEMOSTASIS (ARGON PLASMA COAGULATION/BICAP);  Surgeon: Yetta Flock, MD;  Location: [facility] ENDOSCOPY;  Service: Gastroenterology;  Laterality: N/A;  . INSERT / REPLACE / REMOVE PACEMAKER  ?2008  . POLYPECTOMY  11/08/2017   Procedure: POLYPECTOMY;  Surgeon: Milus Banister, MD;  Location: [facility] ENDOSCOPY;  Service: Endoscopy;;  . PROCTOSCOPY N/A 05/22/2018   Procedure: RIGID PROCTOSCOPY;  Surgeon: Michael Boston, MD;  Location: [facility] ORS;  Service: General;  Laterality: N/A;  . RIGHT HEART CATH N/A 02/13/2018   Procedure: RIGHT HEART CATH;  Surgeon: [name], MD;  Location: [place] CV LAB;  Service: Cardiovascular;  Laterality: N/A;  . RIGHT/LEFT HEART CATH AND CORONARY ANGIOGRAPHY N/A 12/11/2019   Procedure: RIGHT/LEFT HEART CATH AND CORONARY ANGIOGRAPHY;  Surgeon: [name], MD;  Location: [place] CV LAB;  Service: Cardiovascular;  Laterality: N/A;  . SUBMUCOSAL TATTOO INJECTION   03/02/2020   Procedure: SUBMUCOSAL TATTOO INJECTION;  Surgeon: Yetta Flock, MD;  Location: [facility] ENDOSCOPY;  Service: Gastroenterology;;  . TUBAL LIGATION    . XI ROBOTIC ASSISTED LOWER ANTERIOR RESECTION N/A 05/22/2018   Procedure: XI ROBOTIC ASSISTED SIGMOID COLOECTOMY MOBILIZATION OF SPLENIC FLEXURE, FIREFLY ASSESSMENT OF PERFUSION;  Surgeon: Michael Boston, MD;  Location: [facility] ORS;  Service: General;  Laterality: N/A;  ERAS PATHWAY    Social History Morgan Clay  reports that she has never smoked. She has never used smokeless tobacco. She reports current alcohol use. She reports current drug use. Drug: Marijuana.  family history includes Asthma in her father and sister; Heart attack in her father and mother; Lung cancer in her sister; Stroke in her brother.  No Known Allergies  LABORATORIES: Hemoglobin 8.3.  Stable     PHYSICAL EXAMINATION: Vital signs: BP (!) 176/67 (BP Location: Left Arm)   Pulse 66   Temp 97.7 F (36.5 C) (Oral)   Resp 16   Ht 4\' 11"  (1.499 m)   Wt 63.5 kg   SpO2 100%   BMI 28.28 kg/m   Constitutional: generally well-appearing, no acute distress Psychiatric: alert and oriented x3, cooperative Eyes: extraocular movements  intact, anicteric, conjunctiva pink Mouth: oral pharynx moist, no lesions Neck: supple no lymphadenopathy Cardiovascular: heart regular rate and rhythm, no murmur Lungs: clear to auscultation bilaterally Abdomen: soft, nontender, nondistended, no obvious ascites, no peritoneal signs, normal bowel sounds, no organomegaly Rectal: Normal yesterday.  Not repeated Extremities: no clubbing, cyanosis, or lower extremity edema bilaterally Skin: no lesions on visible extremities Neuro: No focal deficits.  Cranial nerves intact  ASSESSMENT:  1.  Acute GI bleeding presumably secondary to cecal AVM status post APC ablation.  No evidence for bleeding since her procedure.  Stable hemoglobin 2.  Multiple medical problems 3.  Atrial  fibrillation.  Her anticoagulation has been resumed   PLAN:  1.  No new recommendations from GI standpoint 2.  Observe for any evidence of recurrent bleeding 3.  Okay for discharge from GI standpoint when cleared from internal medicine perspective 4.  No outpatient GI follow-up required Please call for questions or problems.  We will sign off.  Discussed with patient.  Docia Chuck. Geri Seminole., M.D. [facility] Division of Gastroenterology

## 2020-06-01 NOTE — Consult Note (Addendum)
Advanced Heart Failure Team Consult Note   Primary Physician: Grier Mitts Primary Cardiologist:  Loralie Champagne  Reason for Consultation: Management of NICM, combined systolic/diastolic chf, persistent afib/flutter  HPI:    Morgan Clay is seen today for evaluation of  at the request of Dr. Irene Pap.   Last Echo 12/2019, EF 50-55%, G2DD, mildly reduced RV function, mild MR, Moderate AI  75 y.o. with history of nonischemic cardiomyopathy with recovery of EF with BiV pacing, recurrent GI bleeding, paroxysmal atrial flutter was referred by Dr. Caryl Comes for evaluation of CHF.  Patient has a history of presumed nonischemic cardiomyopathy for a number of years.  Echo in 12/15 showed EF 25-30% and Cardiolite at that time showed no ischemia.  She had Medtronic CRT-D system placed, and EF recovered. In 4/19, echo showed EF 60-65%.    Echo in 10/19 showed EF 55-60%, mild-moderate MR, moderate AI, moderate RV dilation.    She  had problems with GI bleeding.  In 2019 multiple evaluations/scopes for acute GI bleeding due to various AVMs. Eliquis stopped for a while and then restarted. She was admitted in 1/20 with diverticulitis and ended up having a sigmoid colectomy.      She had LHC/RHC in 8/21 with nonobstructive CAD, normal filling pressures, and preserved cardiac output. Echo 8/21 showed EF 50-55%, mild LVH, mildly decreased RV systolic function, moderate aortic insufficiency.    Seen in HF Clinic in 10/21 and was more fatigued. Found to be in atrial flutter but hgb also 6.9. She was on Eliquis at the time. Loaded on amio and converted to NSR. Patient underwent small bowel enteroscopy 03/02/2020 with findings trace esophageal varix, small superficial AVMs prepyloric stomach with other punctate vascular lesions treated with APC, benign duodenal polyp. Etiology likely secondary to jejunal AVM versus early GAVE vsportal gastropathy.Patient underwent small bowel enteroscopy 03/02/2020 with  findings trace esophageal varix, small superficial AVMs prepyloric stomach with other punctate vascular lesions treated with APC, benign duodenal polyp. Etiology likely secondary to jejunal AVM versus early GAVE vsportal gastropathy. Eliquis restarted.    Hgb dropped back down to 7.7 in 12/21. Saw [name] and capsule placed but got stuck in the stomach.   Had volume overload on device in 05/24/20 and torsemide increased by Dr. [name]   She was admitted on 05/27/20 with dizziness, and a fall preceded by melena found to have further GI bleeding hgb of 5.1 and an AKI.  Eliquis was held and she was transfused 2u PRBC's.  Repeat pill endoscope showed bleeding in distal small bowel.  Colonoscopy found no bleeding but large avm in cecum argon lasered.    She reports feeling less dizzy today, denies any further dark stools, denies orthopnea, dyspnea, PND.       Review of Systems: [y] = yes, [ ]  = no   General: Weight gain [ ] ; Weight loss [ ] ; Anorexia [ ] ; Fatigue [x ]; Fever [ ] ; Chills [ ] ; Weakness [ x]  Cardiac: Chest pain/pressure [ ] ; Resting SOB [ ] ; Exertional SOB Blue.Reese ]; Orthopnea [ ] ; Pedal Edema [ ] ; Palpitations [ ] ; Syncope [x ]; Presyncope [ ] ; Paroxysmal nocturnal dyspnea[ ]   Pulmonary: Cough [ ] ; Wheezing[ ] ; Hemoptysis[ ] ; Sputum [ ] ; Snoring [ ]   GI: Vomiting[ ] ; Dysphagia[x ]; Melena[y ]; Hematochezia [ ] ; Heartburn[ ] ; Abdominal pain [ ] ; Constipation [ ] ; Diarrhea [ ] ; BRBPR [ ]   GU: Hematuria[ ] ; Dysuria [ ] ; Nocturia[ ]   Vascular: Pain in legs  with walking [ ] ; Pain in feet with lying flat [ ] ; Non-healing sores [ ] ; Stroke [ ] ; TIA [ ] ; Slurred speech [ ] ;  Neuro: Headaches[ ] ; Vertigo[ ] ; Seizures[ ] ; Paresthesias[ ] ;Blurred vision [ ] ; Diplopia [ ] ; Vision changes [ ]   Ortho/Skin: Arthritis Blue.Reese ]; Joint pain [ y]; Muscle pain [ ] ; Joint swelling [ ] ; Back Pain [ ] ; Rash [ ]   Psych: Depression[ ] ; Anxiety[ ]   Heme: Bleeding problems [x ]; Clotting disorders [ ] ; Anemia [x ]   Endocrine: Diabetes [x ]; Thyroid dysfunction[ ]   Home Medications Prior to Admission medications   Medication Sig Start Date End Date Taking? Authorizing Provider  allopurinol (ZYLOPRIM) 300 MG tablet Take 300 mg by mouth daily.    Yes [provider]  amiodarone (PACERONE) 200 MG tablet Take 1 tablet (200 mg total) by mouth daily. Patient taking differently: Take 200 mg by mouth 2 (two) times daily. 03/15/20  Yes Fenton, Clint R, PA  apixaban (ELIQUIS) 5 MG TABS tablet Take 1 tablet (5 mg total) by mouth 2 (two) times daily. 12/12/19  Yes [name], MD  carvedilol (COREG) 25 MG tablet Take 1 tablet by mouth twice daily 05/13/20  Yes [name], MD  colchicine 0.6 MG tablet Take 0.6 mg by mouth daily. 02/06/18  Yes [provider]  ferrous sulfate 325 (65 FE) MG tablet Take 1 tablet (325 mg total) by mouth daily. 03/04/20 03/04/21 Yes [place] Eric J, DO  gabapentin (NEURONTIN) 300 MG capsule Take 1 capsule (300 mg total) by mouth 2 (two) times daily. 07/17/19  Yes Billie Ruddy, MD  isosorbide-hydrALAZINE (BIDIL) 20-37.5 MG tablet Take 1 tablet by mouth 3 (three) times daily. 12/05/19  Yes [name], MD  levothyroxine (SYNTHROID) 137 MCG tablet Take 1 tablet (137 mcg total) by mouth daily before breakfast. 03/11/20  Yes Billie Ruddy, MD  nitroGLYCERIN (NITROSTAT) 0.4 MG SL tablet Place 0.4 mg under the tongue every 5 (five) minutes x 3 doses as needed for chest pain.    Yes [provider]  pantoprazole (PROTONIX) 40 MG tablet Take 1 tablet (40 mg total) by mouth daily. 04/06/20 04/06/21 Yes Clegg, [name] D, NP  spironolactone (ALDACTONE) 25 MG tablet Take 0.5 tablets (12.5 mg total) by mouth daily. 12/05/19 03/23/22 Yes [name], MD  tiZANidine (ZANAFLEX) 2 MG tablet Take 1 tablet (2 mg total) by mouth at bedtime as needed for muscle spasms. 04/07/20  Yes Billie Ruddy, MD  torsemide (DEMADEX) 20 MG tablet Take 2 tablets (40 mg total) by  mouth daily. 05/24/20 08/22/20 Yes [name], MD  traZODone (DESYREL) 50 MG tablet Take 50 mg by mouth at bedtime.   Yes [provider]    Past Medical History: Past Medical History:  Diagnosis Date  . A-fib ([place])    on Eliquis  . AICD (automatic cardioverter/defibrillator) present 10/06/2015  . Anemia   . ARF (acute renal failure) ([place])   . Arthritis    "qwhere" (01/03/2018)  . Asthma    reports mild asthma since childhood - had COPD on dx list from prior PCP  . Chronic bronchitis ([place])    "get it most years; not this past year though" (01/03/2018)  . Chronic kidney disease    "? stage;  followed by [facility]; [name]" (01/03/2018)  . Chronic systolic congestive heart failure ([place])   . COVID-19 05/03/2020  . DEPRESSION 12/17/2009   Annotation: PHQ-9 score =  14 done on 12/17/2009 Qualifier: Diagnosis of  By: Hassell Done FNP, Tori Milks    . Diabetes mellitus without complication (HCC)    DIET CONTROLLED   . Diverticulosis   . Enteritis   . FIBROIDS, UTERUS 03/05/2008  . GI bleeding 01/03/2018  . Glaucoma   . Gout   . Hepatitis C    HEPATITIS C - s/p treatment with Harvoni, saw hepatology, Dawn Drazek  . History of blood transfusion ~ 11/2017  . Hyperlipemia 12/06/2012  . HYPERTENSION, BENIGN 04/24/2007  . Hypothyroidism   . LBBB (left bundle branch block)   . Lower GI bleeding    "been dealing w/it since 07/2017" (01/03/2018)  . Nonischemic cardiomyopathy ([place])   . OBESITY 05/27/2009  . OBSTRUCTIVE SLEEP APNEA 11/14/2007   no CPAP  . OSTEOPENIA 09/30/2008  . Pain and swelling of left upper extremity 08/08/2017  . Personal history of other infectious and parasitic disease    Hepatitis B  . Pneumonia    "several times" (01/03/2018)  . Pulmonary edema, acute ([place]) 08/09/2017    Past Surgical History: Past Surgical History:  Procedure Laterality Date  . APPENDECTOMY    . CARDIAC CATHETERIZATION    . CARDIOVERSION N/A 12/14/2014   Procedure: CARDIOVERSION;   Surgeon: Dorothy Spark, MD;  Location: [facility] ENDOSCOPY;  Service: Cardiovascular;  Laterality: N/A;  . CARDIOVERSION N/A 02/26/2017   Procedure: CARDIOVERSION;  Surgeon: Evans Lance, MD;  Location: [place] CV LAB;  Service: Cardiovascular;  Laterality: N/A;  . CARDIOVERSION N/A 07/24/2017   Procedure: CARDIOVERSION;  Surgeon: Thayer Headings, MD;  Location: [facility] ENDOSCOPY;  Service: Cardiovascular;  Laterality: N/A;  . CARDIOVERSION N/A 02/12/2020   Procedure: CARDIOVERSION;  Surgeon: [name], MD;  Location: [facility] ENDOSCOPY;  Service: Cardiovascular;  Laterality: N/A;  . COLONOSCOPY N/A 11/08/2017   Procedure: COLONOSCOPY;  Surgeon: Milus Banister, MD;  Location: [facility] ENDOSCOPY;  Service: Endoscopy;  Laterality: N/A;  . DILATION AND CURETTAGE OF UTERUS    . ENTEROSCOPY N/A 03/02/2020   Procedure: ENTEROSCOPY;  Surgeon: Yetta Flock, MD;  Location: [facility] ENDOSCOPY;  Service: Gastroenterology;  Laterality: N/A;  . EP IMPLANTABLE DEVICE N/A 10/06/2015   Procedure: BIV ICD Generator Changeout;  Surgeon: [name], MD;  Location: [place] CV LAB;  Service: Cardiovascular;  Laterality: N/A;  . ESOPHAGOGASTRODUODENOSCOPY N/A 10/26/2017   Procedure: ESOPHAGOGASTRODUODENOSCOPY (EGD);  Surgeon: Ladene Artist, MD;  Location: [facility] ENDOSCOPY;  Service: Endoscopy;  Laterality: N/A;  . ESOPHAGOGASTRODUODENOSCOPY (EGD) WITH PROPOFOL N/A 11/07/2017   Procedure: ESOPHAGOGASTRODUODENOSCOPY (EGD) WITH PROPOFOL;  Surgeon: Milus Banister, MD;  Location: [facility] ENDOSCOPY;  Service: Endoscopy;  Laterality: N/A;  . ESOPHAGOGASTRODUODENOSCOPY (EGD) WITH PROPOFOL N/A 05/29/2020   Procedure: ESOPHAGOGASTRODUODENOSCOPY (EGD) WITH PROPOFOL;  Surgeon: Doran Stabler, MD;  Location: [place];  Service: Gastroenterology;  Laterality: N/A;  . GIVENS CAPSULE STUDY N/A 05/19/2020   Procedure: GIVENS CAPSULE STUDY;  Surgeon: [name], MD;  Location: [place];  Service: Endoscopy;  Laterality: N/A;   .adm for obs since pacemaker, PA wil enter order and see pt  . GIVENS CAPSULE STUDY N/A 05/29/2020   Procedure: GIVENS CAPSULE STUDY;  Surgeon: Doran Stabler, MD;  Location: [place];  Service: Gastroenterology;  Laterality: N/A;  . HOT HEMOSTASIS N/A 10/26/2017   Procedure: HOT HEMOSTASIS (ARGON PLASMA COAGULATION/BICAP);  Surgeon: Ladene Artist, MD;  Location: [facility] ENDOSCOPY;  Service: Endoscopy;  Laterality: N/A;  . HOT HEMOSTASIS N/A 03/02/2020   Procedure: HOT HEMOSTASIS (  ARGON PLASMA COAGULATION/BICAP);  Surgeon: Yetta Flock, MD;  Location: [facility] ENDOSCOPY;  Service: Gastroenterology;  Laterality: N/A;  . INSERT / REPLACE / REMOVE PACEMAKER  ?2008  . POLYPECTOMY  11/08/2017   Procedure: POLYPECTOMY;  Surgeon: Milus Banister, MD;  Location: [facility] ENDOSCOPY;  Service: Endoscopy;;  . PROCTOSCOPY N/A 05/22/2018   Procedure: RIGID PROCTOSCOPY;  Surgeon: Michael Boston, MD;  Location: [facility] ORS;  Service: General;  Laterality: N/A;  . RIGHT HEART CATH N/A 02/13/2018   Procedure: RIGHT HEART CATH;  Surgeon: [name], MD;  Location: [place] CV LAB;  Service: Cardiovascular;  Laterality: N/A;  . RIGHT/LEFT HEART CATH AND CORONARY ANGIOGRAPHY N/A 12/11/2019   Procedure: RIGHT/LEFT HEART CATH AND CORONARY ANGIOGRAPHY;  Surgeon: [name], MD;  Location: [place] CV LAB;  Service: Cardiovascular;  Laterality: N/A;  . SUBMUCOSAL TATTOO INJECTION  03/02/2020   Procedure: SUBMUCOSAL TATTOO INJECTION;  Surgeon: Yetta Flock, MD;  Location: [facility] ENDOSCOPY;  Service: Gastroenterology;;  . TUBAL LIGATION    . XI ROBOTIC ASSISTED LOWER ANTERIOR RESECTION N/A 05/22/2018   Procedure: XI ROBOTIC ASSISTED SIGMOID COLOECTOMY MOBILIZATION OF SPLENIC FLEXURE, FIREFLY ASSESSMENT OF PERFUSION;  Surgeon: Michael Boston, MD;  Location: [facility] ORS;  Service: General;  Laterality: N/A;  ERAS PATHWAY    Family History: Family History  Problem Relation Age of Onset  . Asthma Father   . Heart  attack Father   . Asthma Sister   . Lung cancer Sister   . Heart attack Mother   . Stroke Brother   . Colon cancer Neg Hx   . Esophageal cancer Neg Hx   . Pancreatic cancer Neg Hx   . Stomach cancer Neg Hx   . Liver disease Neg Hx     Social History: Social History   Socioeconomic History  . Marital status: Widowed    Spouse name: Not on file  . Number of children: 1  . Years of education: 5  . Highest education level: Not on file  Occupational History  . Not on file  Tobacco Use  . Smoking status: Never Smoker  . Smokeless tobacco: Never Used  Vaping Use  . Vaping Use: Never used  Substance and Sexual Activity  . Alcohol use: Yes    Comment: 01/03/2018 "couple drinks/month"  . Drug use: Yes    Types: Marijuana    Comment: VERY INTERMITTENT   . Sexual activity: Not Currently  Other Topics Concern  . Not on file  Social History Narrative   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      Home Situation: lives alone      Spiritual Beliefs: [place]      Lifestyle: no regular exercise; poor diet      She is divorced at least one adult child   Never smoker    rare alcohol to occasional at best    no substance abuse         Social Determinants of Health   Financial Resource Strain: Not on file  Food Insecurity: Not on file  Transportation Needs: Not on file  Physical Activity: Not on file  Stress: Not on file  Social Connections: Not on file    Allergies:  No Known Allergies  Objective:    Vital Signs:   Temp:  [97.6 F (36.4 C)-97.7 F (36.5 C)] 97.7 F (36.5 C) (01/25 0822) Pulse Rate:  [60-71] 66 (01/25 0822) Resp:  [15-18] 16 (01/25 0822) BP: (103-176)/(37-83) 176/67 (01/25  7616) SpO2:  [92 %-100 %] 100 % (01/25 0822) Last BM Date: 05/31/20  Weight change: Filed Weights   05/27/20 1344 05/31/20 0955  Weight: 63.5 kg 63.5 kg    Intake/Output:   Intake/Output Summary (Last 24 hours) at 06/01/2020 1027 Last data filed at  05/31/2020 1145 Gross per 24 hour  Intake 400 ml  Output -  Net 400 ml      Physical Exam    Cardiac: JVD difficult to appreciate, normal rate and rhythm, clear s1 and s2, no murmurs, rubs or gallops, no LE edema Pulmonary: very mild decreased breath sounds in right base otherwise clear, not in distress Abdominal: soft abdomen, no guarding, pain with deep palpation of RUQ Psych: Alert, conversant, in good spirits    Telemetry   Atrial sensed paced rhythm 60-70's  EKG    No ecg today  Labs   Basic Metabolic Panel: Recent Labs  Lab 05/27/20 1038 05/27/20 1357 05/28/20 0230 05/29/20 0311 06/01/20 0711  NA 138 137 139 134* 138  K 4.5 4.5 4.0 3.7 4.2  CL 106 104 107 103 108  CO2 23 20* 20* 20* 18*  GLUCOSE 105* 120* 86 107* 96  BUN 67* 66* 65* 63* 26*  CREATININE 2.78* 2.89* 2.90* 2.79* 1.90*  CALCIUM 9.0 9.0 8.7* 8.7* 9.0  MG  --   --   --  2.4  --     Liver Function Tests: Recent Labs  Lab 05/27/20 1357  AST 18  ALT 14  ALKPHOS 75  BILITOT 0.7  PROT 7.0  ALBUMIN 3.6   No results for input(s): LIPASE, AMYLASE in the last 168 hours. No results for input(s): AMMONIA in the last 168 hours.  CBC: Recent Labs  Lab 05/28/20 0230 05/28/20 1153 05/29/20 0311 05/30/20 0213 05/31/20 0401 06/01/20 0711  WBC 6.4  --  6.0 7.4 7.0 6.9  HGB 6.8* 6.6* 8.2* 8.3* 8.7* 8.3*  HCT 20.3* 20.7* 24.6* 26.1* 28.4* 25.1*  MCV 91.9  --  90.4 91.9 93.7 90.6  PLT 171  --  171 184 204 199    Cardiac Enzymes: No results for input(s): CKTOTAL, CKMB, CKMBINDEX, TROPONINI in the last 168 hours.  BNP: BNP (last 3 results) Recent Labs    02/04/20 1416  BNP 437*    ProBNP (last 3 results) Recent Labs    07/17/19 1352 07/23/19 1033  PROBNP 811.0* 637.0*     CBG: Recent Labs  Lab 05/28/20 1149 05/28/20 1709 05/28/20 1921 05/29/20 0058 05/29/20 0420  GLUCAP 92 96 134* 126* 99    Coagulation Studies: No results for input(s): LABPROT, INR in the last 72  hours.   Imaging    No results found.   Medications:     Current Medications: . amiodarone  100 mg Oral Daily  . carvedilol  3.125 mg Oral BID  . isosorbide-hydrALAZINE  1 tablet Oral TID  . levothyroxine  137 mcg Oral QAC breakfast  . pantoprazole  40 mg Oral Daily     Infusions: . sodium chloride 10 mL/hr at 05/31/20 1102     Assessment/Plan   1. NICM Combined systolic and diastolic CHF: Echo in 0/73 showed EF improved to 60-65% with BiV pacing. Echo in 10/19 showed stable EF 55-60% with mild-moderate MR, moderate AI. PYP scan in 3/20 was equivocal for transthyretin amyloidosis. However, echo in 9/20 showed EF back down to 45-50%. LHC/RHC in 8/21 showed nonobstructive CAD, normal filling pressures and cardiac output. Last Echo 12/2019, EF 50-55%, G2DD, mildly  reduced RV function, mild MR, Moderate AI. NYHA III  -volume status stable maybe mildly up, hgb stable can restart torsemide -hypertensive but just started back on bidil today.   -will need PYP scan to reasses for amyloid can be done outpatient  2. Persistent Atrial Flutter/Afib: On 03/01/20 at that time back in Aflutter, mildly volume overloaded had her diuretic increased for two days.  Loaded with amiodarone and plan was to repeat DCCV. However, this was cancelled because she converted with amiodarone. Followed by afib clinic.  CHADS2VASC score of 5.    -in NSR currently continue amiodarone -continue to hold eliquis will consider for watchman device in future   3. AKI on CKD3 Followed by nephrology outpatient  -improved with holding diuretics and administering blood products -okay to restart diuretic  4. Acute on Chronic anemia 2/2 GI blood loss: Long history multiple bleeding events and around 3 endoscopy/colonoscopies over the last 3 years.  Eliquis has been stopped and started back in the past.  Cecal avm ablated this admission.   -continue to hold eliquis  5. Compensated Cirrhosis: S/p treatment with  Harvoni for Hep C, followed by GI outpatient.  No current ascites and LFT's look good.    -GI managing   Length of Stay: 5  Katherine Roan, MD  06/01/2020, 10:27 AM  Advanced Heart Failure Team Pager [phone] (M-F; 7a - 4p)  Please contact [facility] for night-coverage after hours (4p -7a ) and weekends on [email or web address]  Patient seen and examined with the above-signed Advanced Practice Provider and/or Housestaff. I personally reviewed laboratory data, imaging studies and relevant notes. I independently examined the patient and formulated the important aspects of the plan. I have edited the note to reflect any of my changes or salient points. I have personally discussed the plan with the patient and/or family.  75 y/o woman with systolic HF with recovered EF (LBBB s/p CRT), PAF/FL, cirrhosis with mild varices and recurrent GI bleeding due to AVMs.   Readmitted with symptomatic anemia with hgb 5.1. W/u shows recurrent AVMs. Currently in NSR on po amiodarone  General:  Well appearing. No resp difficulty HEENT: normal Neck: supple. JVP to jaw Carotids 2+ bilat; no bruits. No lymphadenopathy or thryomegaly appreciated. Cor: PMI nondisplaced. Regular rate & rhythm. No rubs, gallops or murmurs. Lungs: clear Abdomen: soft, nontender, mildly distended. No hepatosplenomegaly. No bruits or masses. Good bowel sounds. Extremities: no cyanosis, clubbing, rash, edema Neuro: alert & orientedx3, cranial nerves grossly intact. moves all 4 extremities w/o difficulty. Affect pleasant  In my mind, she clearly has failed Eliquis with recurrent GI bleeds in the setting of mild varices and diffuse AVMs. She has previously had a left colectomy for diverticulosis. Despite CHADSVasc score of 5, I think she is at too high risk for serious rebleeding and would not restart Eliquis at this point. We can begin w/u to see if she will qualify for Watchman Device as outpatient. Would leave off Eliquis for now.    Currently volume overloaded after bowel prep. Will give 1 dose of 40mg  IV lasix. I will d/w Dr. [name].   Glori Bickers, MD  3:46 PM

## 2020-06-01 NOTE — Plan of Care (Signed)

## 2020-06-02 DIAGNOSIS — K922 Gastrointestinal hemorrhage, unspecified: Secondary | ICD-10-CM | POA: Diagnosis not present

## 2020-06-02 DIAGNOSIS — I5033 Acute on chronic diastolic (congestive) heart failure: Secondary | ICD-10-CM | POA: Diagnosis not present

## 2020-06-02 LAB — CBC
HCT: 24 % — ABNORMAL LOW (ref 36.0–46.0)
Hemoglobin: 7.9 g/dL — ABNORMAL LOW (ref 12.0–15.0)
MCH: 29.9 pg (ref 26.0–34.0)
MCHC: 32.9 g/dL (ref 30.0–36.0)
MCV: 90.9 fL (ref 80.0–100.0)
Platelets: 188 10*3/uL (ref 150–400)
RBC: 2.64 MIL/uL — ABNORMAL LOW (ref 3.87–5.11)
RDW: 17.7 % — ABNORMAL HIGH (ref 11.5–15.5)
WBC: 7.5 10*3/uL (ref 4.0–10.5)
nRBC: 0 % (ref 0.0–0.2)

## 2020-06-02 LAB — BASIC METABOLIC PANEL
Anion gap: 11 (ref 5–15)
BUN: 22 mg/dL (ref 8–23)
CO2: 19 mmol/L — ABNORMAL LOW (ref 22–32)
Calcium: 8.9 mg/dL (ref 8.9–10.3)
Chloride: 109 mmol/L (ref 98–111)
Creatinine, Ser: 2.04 mg/dL — ABNORMAL HIGH (ref 0.44–1.00)
GFR, Estimated: 25 mL/min — ABNORMAL LOW (ref >60)
Glucose, Bld: 100 mg/dL — ABNORMAL HIGH (ref 70–99)
Potassium: 3.9 mmol/L (ref 3.5–5.1)
Sodium: 139 mmol/L (ref 135–145)

## 2020-06-02 LAB — SARS CORONAVIRUS 2 (TAT 6-24 HRS): SARS Coronavirus 2: NEGATIVE

## 2020-06-02 LAB — HEMOGLOBIN AND HEMATOCRIT, BLOOD
HCT: 25 % — ABNORMAL LOW (ref 36.0–46.0)
Hemoglobin: 7.9 g/dL — ABNORMAL LOW (ref 12.0–15.0)

## 2020-06-02 LAB — MAGNESIUM: Magnesium: 2.1 mg/dL (ref 1.7–2.4)

## 2020-06-02 MED ORDER — CARVEDILOL 6.25 MG PO TABS
6.2500 mg | ORAL_TABLET | Freq: Two times a day (BID) | ORAL | Status: DC
  Administered 2020-06-02 – 2020-06-03 (×2): 6.25 mg via ORAL
  Filled 2020-06-02 (×2): qty 1

## 2020-06-02 MED ORDER — TRAZODONE HCL 50 MG PO TABS
50.0000 mg | ORAL_TABLET | Freq: Every day | ORAL | Status: DC
  Administered 2020-06-02: 50 mg via ORAL
  Filled 2020-06-02: qty 1

## 2020-06-02 MED ORDER — SPIRONOLACTONE 12.5 MG HALF TABLET
12.5000 mg | ORAL_TABLET | Freq: Every day | ORAL | Status: DC
  Administered 2020-06-02 – 2020-06-03 (×2): 12.5 mg via ORAL
  Filled 2020-06-02 (×2): qty 1

## 2020-06-02 NOTE — Plan of Care (Signed)

## 2020-06-02 NOTE — Progress Notes (Addendum)
Advanced Heart Failure Team Consult Note   Primary Physician: Grier Mitts Primary Cardiologist:  Loralie Champagne  Reason for Consultation: Management of NICM, combined systolic/diastolic chf, persistent afib/flutter  HPI:    Morgan Clay is seen today for evaluation of  at the request of Dr. Irene Pap.   Last Echo 12/2019, EF 50-55%, G2DD, mildly reduced RV function, mild MR, Moderate AI  75 y.o. with history of nonischemic cardiomyopathy with recovery of EF with BiV pacing, recurrent GI bleeding, paroxysmal atrial flutter was referred by Dr. Caryl Comes for evaluation of CHF.  Patient has a history of presumed nonischemic cardiomyopathy for a number of years.  Echo in 12/15 showed EF 25-30% and Cardiolite at that time showed no ischemia.  She had Medtronic CRT-D system placed, and EF recovered. In 4/19, echo showed EF 60-65%.    Echo in 10/19 showed EF 55-60%, mild-moderate MR, moderate AI, moderate RV dilation.    She  had problems with GI bleeding.  In 2019 multiple evaluations/scopes for acute GI bleeding due to various AVMs. Eliquis stopped for a while and then restarted. She was admitted in 1/20 with diverticulitis and ended up having a sigmoid colectomy.      She had LHC/RHC in 8/21 with nonobstructive CAD, normal filling pressures, and preserved cardiac output. Echo 8/21 showed EF 50-55%, mild LVH, mildly decreased RV systolic function, moderate aortic insufficiency.    Seen in HF Clinic in 10/21 and was more fatigued. Found to be in atrial flutter but hgb also 6.9. She was on Eliquis at the time. Loaded on amio and converted to NSR. Patient underwent small bowel enteroscopy 03/02/2020 with findings trace esophageal varix, small superficial AVMs prepyloric stomach with other punctate vascular lesions treated with APC, benign duodenal polyp. Etiology likely secondary to jejunal AVM versus early GAVE vsportal gastropathy.Patient underwent small bowel enteroscopy 03/02/2020 with  findings trace esophageal varix, small superficial AVMs prepyloric stomach with other punctate vascular lesions treated with APC, benign duodenal polyp. Etiology likely secondary to jejunal AVM versus early GAVE vsportal gastropathy. Eliquis restarted.    Hgb dropped back down to 7.7 in 12/21. Saw [name] and capsule placed but got stuck in the stomach.   Had volume overload on device in 05/24/20 and torsemide increased by Dr. [name]   She was admitted on 05/27/20 with dizziness, and a fall preceded by melena found to have further GI bleeding hgb of 5.1 and an AKI.  Eliquis was held and she was transfused 2u PRBC's.  Repeat pill endoscope showed bleeding in distal small bowel.  Colonoscopy found no bleeding but large avm in cecum argon lasered.    She denies dizziness, no bowel movements still has not noticed any bleeding.  Reports robust urine output with IV lasix has not been up yet but no dyspnea at rest, denies orthopnea or PND.       Home Medications Prior to Admission medications   Medication Sig Start Date End Date Taking? Authorizing Provider  allopurinol (ZYLOPRIM) 300 MG tablet Take 300 mg by mouth daily.    Yes [provider]  amiodarone (PACERONE) 200 MG tablet Take 1 tablet (200 mg total) by mouth daily. Patient taking differently: Take 200 mg by mouth 2 (two) times daily. 03/15/20  Yes Fenton, Clint R, PA  apixaban (ELIQUIS) 5 MG TABS tablet Take 1 tablet (5 mg total) by mouth 2 (two) times daily. 12/12/19  Yes [name], MD  carvedilol (COREG) 25 MG tablet Take 1 tablet by  mouth twice daily 05/13/20  Yes [name], MD  colchicine 0.6 MG tablet Take 0.6 mg by mouth daily. 02/06/18  Yes [provider]  ferrous sulfate 325 (65 FE) MG tablet Take 1 tablet (325 mg total) by mouth daily. 03/04/20 03/04/21 Yes [place] Eric J, DO  gabapentin (NEURONTIN) 300 MG capsule Take 1 capsule (300 mg total) by mouth 2 (two) times daily. 07/17/19  Yes Billie Ruddy, MD  isosorbide-hydrALAZINE (BIDIL) 20-37.5 MG tablet Take 1 tablet by mouth 3 (three) times daily. 12/05/19  Yes [name], MD  levothyroxine (SYNTHROID) 137 MCG tablet Take 1 tablet (137 mcg total) by mouth daily before breakfast. 03/11/20  Yes Billie Ruddy, MD  nitroGLYCERIN (NITROSTAT) 0.4 MG SL tablet Place 0.4 mg under the tongue every 5 (five) minutes x 3 doses as needed for chest pain.    Yes [provider]  pantoprazole (PROTONIX) 40 MG tablet Take 1 tablet (40 mg total) by mouth daily. 04/06/20 04/06/21 Yes Clegg, [name] D, NP  spironolactone (ALDACTONE) 25 MG tablet Take 0.5 tablets (12.5 mg total) by mouth daily. 12/05/19 03/23/22 Yes [name], MD  tiZANidine (ZANAFLEX) 2 MG tablet Take 1 tablet (2 mg total) by mouth at bedtime as needed for muscle spasms. 04/07/20  Yes Billie Ruddy, MD  torsemide (DEMADEX) 20 MG tablet Take 2 tablets (40 mg total) by mouth daily. 05/24/20 08/22/20 Yes [name], MD  traZODone (DESYREL) 50 MG tablet Take 50 mg by mouth at bedtime.   Yes [provider]    Past Medical History: Past Medical History:  Diagnosis Date  . A-fib ([place])    on Eliquis  . AICD (automatic cardioverter/defibrillator) present 10/06/2015  . Anemia   . ARF (acute renal failure) ([place])   . Arthritis    "qwhere" (01/03/2018)  . Asthma    reports mild asthma since childhood - had COPD on dx list from prior PCP  . Chronic bronchitis ([place])    "get it most years; not this past year though" (01/03/2018)  . Chronic kidney disease    "? stage;  followed by [facility]; [name]" (01/03/2018)  . Chronic systolic congestive heart failure ([place])   . COVID-19 05/03/2020  . DEPRESSION 12/17/2009   Annotation: PHQ-9 score = 14 done on 12/17/2009 Qualifier: Diagnosis of  By: Hassell Done FNP, Tori Milks    . Diabetes mellitus without complication (HCC)    DIET CONTROLLED   . Diverticulosis   . Enteritis   . FIBROIDS, UTERUS 03/05/2008  . GI bleeding  01/03/2018  . Glaucoma   . Gout   . Hepatitis C    HEPATITIS C - s/p treatment with Harvoni, saw hepatology, Dawn Drazek  . History of blood transfusion ~ 11/2017  . Hyperlipemia 12/06/2012  . HYPERTENSION, BENIGN 04/24/2007  . Hypothyroidism   . LBBB (left bundle branch block)   . Lower GI bleeding    "been dealing w/it since 07/2017" (01/03/2018)  . Nonischemic cardiomyopathy ([place])   . OBESITY 05/27/2009  . OBSTRUCTIVE SLEEP APNEA 11/14/2007   no CPAP  . OSTEOPENIA 09/30/2008  . Pain and swelling of left upper extremity 08/08/2017  . Personal history of other infectious and parasitic disease    Hepatitis B  . Pneumonia    "several times" (01/03/2018)  . Pulmonary edema, acute ([place]) 08/09/2017    Past Surgical History: Past Surgical History:  Procedure Laterality Date  . APPENDECTOMY    . CARDIAC CATHETERIZATION    .  CARDIOVERSION N/A 12/14/2014   Procedure: CARDIOVERSION;  Surgeon: Dorothy Spark, MD;  Location: [facility] ENDOSCOPY;  Service: Cardiovascular;  Laterality: N/A;  . CARDIOVERSION N/A 02/26/2017   Procedure: CARDIOVERSION;  Surgeon: Evans Lance, MD;  Location: [place] CV LAB;  Service: Cardiovascular;  Laterality: N/A;  . CARDIOVERSION N/A 07/24/2017   Procedure: CARDIOVERSION;  Surgeon: Thayer Headings, MD;  Location: [facility] ENDOSCOPY;  Service: Cardiovascular;  Laterality: N/A;  . CARDIOVERSION N/A 02/12/2020   Procedure: CARDIOVERSION;  Surgeon: [name], MD;  Location: [facility] ENDOSCOPY;  Service: Cardiovascular;  Laterality: N/A;  . COLONOSCOPY N/A 11/08/2017   Procedure: COLONOSCOPY;  Surgeon: Milus Banister, MD;  Location: [facility] ENDOSCOPY;  Service: Endoscopy;  Laterality: N/A;  . DILATION AND CURETTAGE OF UTERUS    . ENTEROSCOPY N/A 03/02/2020   Procedure: ENTEROSCOPY;  Surgeon: Yetta Flock, MD;  Location[facility] ENDOSCOPY;  Service: Gastroenterology;  Laterality: N/A;  . EP IMPLANTABLE DEVICE N/A 10/06/2015   Procedure: BIV ICD Generator Changeout;  Surgeon:  [name], MD;  Location: [place] CV LAB;  Service: Cardiovascular;  Laterality: N/A;  . ESOPHAGOGASTRODUODENOSCOPY N/A 10/26/2017   Procedure: ESOPHAGOGASTRODUODENOSCOPY (EGD);  Surgeon: Ladene Artist, MD;  Location: [facility] ENDOSCOPY;  Service: Endoscopy;  Laterality: N/A;  . ESOPHAGOGASTRODUODENOSCOPY (EGD) WITH PROPOFOL N/A 11/07/2017   Procedure: ESOPHAGOGASTRODUODENOSCOPY (EGD) WITH PROPOFOL;  Surgeon: Milus Banister, MD;  Location: [facility] ENDOSCOPY;  Service: Endoscopy;  Laterality: N/A;  . ESOPHAGOGASTRODUODENOSCOPY (EGD) WITH PROPOFOL N/A 05/29/2020   Procedure: ESOPHAGOGASTRODUODENOSCOPY (EGD) WITH PROPOFOL;  Surgeon: Doran Stabler, MD;  Location: [place];  Service: Gastroenterology;  Laterality: N/A;  . GIVENS CAPSULE STUDY N/A 05/19/2020   Procedure: GIVENS CAPSULE STUDY;  Surgeon: [name], MD;  Location: [place];  Service: Endoscopy;  Laterality: N/A;  .adm for obs since pacemaker, PA wil enter order and see pt  . GIVENS CAPSULE STUDY N/A 05/29/2020   Procedure: GIVENS CAPSULE STUDY;  Surgeon: Doran Stabler, MD;  Location: [place];  Service: Gastroenterology;  Laterality: N/A;  . HOT HEMOSTASIS N/A 10/26/2017   Procedure: HOT HEMOSTASIS (ARGON PLASMA COAGULATION/BICAP);  Surgeon: Ladene Artist, MD;  Location: [facility] ENDOSCOPY;  Service: Endoscopy;  Laterality: N/A;  . HOT HEMOSTASIS N/A 03/02/2020   Procedure: HOT HEMOSTASIS (ARGON PLASMA COAGULATION/BICAP);  Surgeon: Yetta Flock, MD;  Location: [facility] ENDOSCOPY;  Service: Gastroenterology;  Laterality: N/A;  . INSERT / REPLACE / REMOVE PACEMAKER  ?2008  . POLYPECTOMY  11/08/2017   Procedure: POLYPECTOMY;  Surgeon: Milus Banister, MD;  Location: [facility] ENDOSCOPY;  Service: Endoscopy;;  . PROCTOSCOPY N/A 05/22/2018   Procedure: RIGID PROCTOSCOPY;  Surgeon: Michael Boston, MD;  Location: [facility] ORS;  Service: General;  Laterality: N/A;  . RIGHT HEART CATH N/A 02/13/2018   Procedure: RIGHT HEART CATH;  Surgeon:  [name], MD;  Location: [place] CV LAB;  Service: Cardiovascular;  Laterality: N/A;  . RIGHT/LEFT HEART CATH AND CORONARY ANGIOGRAPHY N/A 12/11/2019   Procedure: RIGHT/LEFT HEART CATH AND CORONARY ANGIOGRAPHY;  Surgeon: [name], MD;  Location: [place] CV LAB;  Service: Cardiovascular;  Laterality: N/A;  . SUBMUCOSAL TATTOO INJECTION  03/02/2020   Procedure: SUBMUCOSAL TATTOO INJECTION;  Surgeon: Yetta Flock, MD;  Location: [facility] ENDOSCOPY;  Service: Gastroenterology;;  . TUBAL LIGATION    . XI ROBOTIC ASSISTED LOWER ANTERIOR RESECTION N/A 05/22/2018   Procedure: XI ROBOTIC ASSISTED SIGMOID COLOECTOMY MOBILIZATION OF SPLENIC FLEXURE, FIREFLY ASSESSMENT OF PERFUSION;  Surgeon: Johney Maine,  Remo Lipps, MD;  Location: [facility] ORS;  Service: General;  Laterality: N/A;  ERAS PATHWAY    Family History: Family History  Problem Relation Age of Onset  . Asthma Father   . Heart attack Father   . Asthma Sister   . Lung cancer Sister   . Heart attack Mother   . Stroke Brother   . Colon cancer Neg Hx   . Esophageal cancer Neg Hx   . Pancreatic cancer Neg Hx   . Stomach cancer Neg Hx   . Liver disease Neg Hx     Social History: Social History   Socioeconomic History  . Marital status: Widowed    Spouse name: Not on file  . Number of children: 1  . Years of education: 42  . Highest education level: Not on file  Occupational History  . Not on file  Tobacco Use  . Smoking status: Never Smoker  . Smokeless tobacco: Never Used  Vaping Use  . Vaping Use: Never used  Substance and Sexual Activity  . Alcohol use: Yes    Comment: 01/03/2018 "couple drinks/month"  . Drug use: Yes    Types: Marijuana    Comment: VERY INTERMITTENT   . Sexual activity: Not Currently  Other Topics Concern  . Not on file  Social History Narrative   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      Home Situation: lives alone      Spiritual Beliefs: [place]      Lifestyle: no regular  exercise; poor diet      She is divorced at least one adult child   Never smoker    rare alcohol to occasional at best    no substance abuse         Social Determinants of Health   Financial Resource Strain: Not on file  Food Insecurity: Not on file  Transportation Needs: Not on file  Physical Activity: Not on file  Stress: Not on file  Social Connections: Not on file    Allergies:  No Known Allergies  Objective:    Vital Signs:   Temp:  [97.7 F (36.5 C)-98.9 F (37.2 C)] 98.9 F (37.2 C) (01/26 0500) Pulse Rate:  [66-71] 68 (01/26 0500) Resp:  [16-19] 19 (01/26 0500) BP: (139-176)/(47-67) 139/47 (01/26 0500) SpO2:  [98 %-100 %] 98 % (01/26 0500) Weight:  [63.7 kg] 63.7 kg (01/26 0500) Last BM Date: 05/31/20  Weight change: Filed Weights   05/27/20 1344 05/31/20 0955 06/02/20 0500  Weight: 63.5 kg 63.5 kg 63.7 kg    Intake/Output:   Intake/Output Summary (Last 24 hours) at 06/02/2020 0814 Last data filed at 06/01/2020 1200 Gross per 24 hour  Intake 150 ml  Output --  Net 150 ml      Physical Exam    Cardiac: JVD difficult to assess, normal rate and rhythm, clear s1 and s2, diastolic murmur 2/6, no rubs or gallops, no LE edema Pulmonary: clear with improved aeration of right base, not in distress Abdominal: non distended abdomen, soft and nontender Psych: Alert, conversant, in good spirits     Telemetry   Atrial sensed paced rhythm 60-70's  EKG    No ecg today  Labs   Basic Metabolic Panel: Recent Labs  Lab 05/27/20 1357 05/28/20 0230 05/29/20 0311 06/01/20 0711 06/02/20 0250  NA 137 139 134* 138 139  K 4.5 4.0 3.7 4.2 3.9  CL 104 107 103 108 109  CO2 20* 20* 20* 18* 19*  GLUCOSE 120* 86 107* 96 100*  BUN 66* 65* 63* 26* 22  CREATININE 2.89* 2.90* 2.79* 1.90* 2.04*  CALCIUM 9.0 8.7* 8.7* 9.0 8.9  MG  --   --  2.4  --  2.1    Liver Function Tests: Recent Labs  Lab 05/27/20 1357  AST 18  ALT 14  ALKPHOS 75  BILITOT 0.7   PROT 7.0  ALBUMIN 3.6   No results for input(s): LIPASE, AMYLASE in the last 168 hours. No results for input(s): AMMONIA in the last 168 hours.  CBC: Recent Labs  Lab 05/29/20 0311 05/30/20 0213 05/31/20 0401 06/01/20 0711 06/02/20 0250  WBC 6.0 7.4 7.0 6.9 7.5  HGB 8.2* 8.3* 8.7* 8.3* 7.9*  HCT 24.6* 26.1* 28.4* 25.1* 24.0*  MCV 90.4 91.9 93.7 90.6 90.9  PLT 171 184 204 199 188    Cardiac Enzymes: No results for input(s): CKTOTAL, CKMB, CKMBINDEX, TROPONINI in the last 168 hours.  BNP: BNP (last 3 results) Recent Labs    02/04/20 1416  BNP 437*    ProBNP (last 3 results) Recent Labs    07/17/19 1352 07/23/19 1033  PROBNP 811.0* 637.0*     CBG: Recent Labs  Lab 05/28/20 1149 05/28/20 1709 05/28/20 1921 05/29/20 0058 05/29/20 0420  GLUCAP 92 96 134* 126* 99    Coagulation Studies: No results for input(s): LABPROT, INR in the last 72 hours.   Imaging   No results found.   Medications:     Current Medications: . amiodarone  100 mg Oral Daily  . carvedilol  3.125 mg Oral BID  . isosorbide-hydrALAZINE  1 tablet Oral TID  . levothyroxine  137 mcg Oral QAC breakfast  . pantoprazole  40 mg Oral Daily  . torsemide  40 mg Oral Daily    Infusions: . sodium chloride 10 mL/hr at 05/31/20 1102    Assessment/Plan   1. NICM Combined systolic and diastolic CHF: Echo in 0/35 showed EF improved to 60-65% with BiV pacing. Echo in 10/19 showed stable EF 55-60% with mild-moderate MR, moderate AI. PYP scan in 3/20 was equivocal for transthyretin amyloidosis. However, echo in 9/20 showed EF back down to 45-50%. LHC/RHC in 8/21 showed nonobstructive CAD, normal filling pressures and cardiac output. Last Echo 12/2019, EF 50-55%, G2DD, mildly reduced RV function, mild MR, Moderate AI. NYHA III  -volume status improved, transition back to home torsemide 40mg  daily -BP improved with resuming bidil yesterday, continue low dose carvedilol -will need PYP scan to  reasses for amyloid can be done outpatient  2. Persistent Atrial Flutter/Afib: On 03/01/20 at that time back in Aflutter, mildly volume overloaded had her diuretic increased for two days.  Loaded with amiodarone and plan was to repeat DCCV. However, this was cancelled because she converted with amiodarone. Followed by afib clinic.  CHADS2VASC score of 5.    -in NSR currently continue amiodarone -continue to hold eliquis will consider for watchman device in future   3. AKI on CKD3 Followed by nephrology outpatient  -improved with holding diuretics and administering blood products -restarted diuretic therapy  4. Acute on Chronic anemia 2/2 GI blood loss: Long history multiple bleeding events while on eliquis and around 3 endoscopy/colonoscopies over the last 3 years. Sigmoid colectomy for recurrent diverticulitis. Eliquis has been stopped and started back in the past.  Cecal avm ablated this admission.   -hgb 7.9 from 8.3 yesterday denies any bleeding or bm -continue to hold eliquis   5. Compensated Cirrhosis: S/p treatment with Harvoni  for Hep C, followed by GI outpatient.  No current ascites and LFT's look good.    -GI managing  Length of Stay: 6  Katherine Roan, MD  06/02/2020, 8:14 AM  Advanced Heart Failure Team Pager [phone] (M-F; 7a - 4p)  Please contact [facility] for night-coverage after hours (4p -7a ) and weekends on [email or web address]  Agree with the above note.   No complaints this morning.  Hgb 7.9, no further overt bleeding.  Colonoscopy yesterday with APC to cecal AVM.    General: NAD Neck: JVP 8 cm, no thyromegaly or thyroid nodule.  Lungs: Clear to auscultation bilaterally with normal respiratory effort. CV: Nondisplaced PMI.  Heart regular S1/S2, no S3/S4, no murmur.  No peripheral edema.   Abdomen: Soft, nontender, no hepatosplenomegaly, no distention.  Skin: Intact without lesions or rashes.  Neurologic: Alert and oriented x 3.  Psych: Normal  affect. Extremities: No clubbing or cyanosis.  HEENT: Normal.   Continue to follow hgb overnight to make sure does not fall further.  I am going to leave her off Eliquis for now with repetitive bleeding.  CHADSVASC 5, so she does have a significant risk of CVA off Eliquis and we discussed this.   - I think that our best option going forwards will be Watchman device.  I am going to refer her to Dr. Quentin Ore after discharge.   Volume status looks ok.  She can resume her home torsemide 40 mg daily today.  Creatinine is near baseline, 2.0.   BP elevated.  She is on her home Bidil.  Can also titrate back up on Coreg and add back her spironolactone.   I had planned to repeat her PYP scan as an outpatient, can see if this can be done as an inpatient so she does not have to return for this.   Loralie Champagne 06/02/2020 11:43 AM

## 2020-06-02 NOTE — Progress Notes (Signed)
Physical Therapy Treatment Patient Details Name: Morgan Clay MRN: [id number] DOB: 09/05/1945 Today's Date: 06/02/2020    History of Present Illness Patient is a 75 y/o female with PMH of compensated cirrhosis of liver, chronic anemia and GI bleed, CHF, DM II, CKD, chronic Afib. Presented to ED with B LE weakness with multiple falls and Hgb of 5.0    PT Comments    Continuing work on functional mobility and activity tolerance;  Smooth walk today, and she covered more distance than we both anticipated; Fatigued after amb, but she seemed pleased with the walk; OK for dc home from PT standpoint    Follow Up Recommendations  Outpatient PT;Other (comment) (pt would rather not have HHPT)     Equipment Recommendations  3in1 (PT)    Recommendations for Other Services       Precautions / Restrictions Precautions Precautions: Fall Precaution Comments: reinforced to self-monitor for activity tolerance    Mobility  Bed Mobility               General bed mobility comments: sitting on couch by windoe upon PT arrival  Transfers Overall transfer level: Needs assistance Equipment used: 4-wheeled walker Transfers: Sit to/from Stand Sit to Stand: Supervision         General transfer comment: cues for hand placement; good rise  Ambulation/Gait Ambulation/Gait assistance: Supervision Gait Distance (Feet): 200 Feet Assistive device: 4-wheeled walker Gait Pattern/deviations: Step-through pattern     General Gait Details: Cues to self-monitor; Notably fatigued at end of walk   Stairs             Wheelchair Mobility    Modified Rankin (Stroke Patients Only)       Balance     Sitting balance-Leahy Scale: Good       Standing balance-Leahy Scale: Fair                              Cognition Arousal/Alertness: Awake/alert Behavior During Therapy: WFL for tasks assessed/performed Overall Cognitive Status: Within Functional Limits for tasks  assessed                                        Exercises      General Comments        Pertinent Vitals/Pain Pain Assessment: Faces Faces Pain Scale: Hurts a little bit Pain Location: L hip Pain Descriptors / Indicators: Aching;Grimacing;Guarding Pain Intervention(s): Monitored during session    Home Living                      Prior Function            PT Goals (current goals can now be found in the care plan section) Acute Rehab PT Goals Patient Stated Goal: to go home PT Goal Formulation: With patient Time For Goal Achievement: 06/12/20 Potential to Achieve Goals: Good Progress towards PT goals: Progressing toward goals (will likely meet goals next session)    Frequency    Min 3X/week      PT Plan Current plan remains appropriate    Co-evaluation              AM-PAC PT "6 Clicks" Mobility   Outcome Measure  Help needed turning from your back to your side while in a flat bed without using bedrails?: A Little Help needed moving from  lying on your back to sitting on the side of a flat bed without using bedrails?: A Little Help needed moving to and from a bed to a chair (including a wheelchair)?: None Help needed standing up from a chair using your arms (e.g., wheelchair or bedside chair)?: None Help needed to walk in hospital room?: A Little Help needed climbing 3-5 steps with a railing? : A Little 6 Click Score: 20    End of Session   Activity Tolerance: Patient tolerated treatment well Patient left: Other (comment) (sitting on the couch by the sun) Nurse Communication: Mobility status PT Visit Diagnosis: Unsteadiness on feet (R26.81);Other abnormalities of gait and mobility (R26.89);Muscle weakness (generalized) (M62.81);Repeated falls (R29.6)     Time: 4098-1191 PT Time Calculation (min) (ACUTE ONLY): 19 min  Charges:  $Gait Training: 8-22 mins                     Roney Marion, [place]  Acute Rehabilitation  Services Pager [phone] Office [phone]    Colletta [place] 06/02/2020, 5:35 PM

## 2020-06-02 NOTE — Progress Notes (Signed)
PROGRESS NOTE  Morgan Clay HUT:[id number] DOB: 12-18-1945 DOA: 05/27/2020 PCP: Patient, No Pcp Per  HPI/Recap of past 24 hours:  Morgan Clay is a 75 y.o. female with history of chronic anemia and GI bleed who had a recent capsule endoscopy and it was not sure if it left the stomach.  Was advised to come to the ER.  On arrival to the ER patient complained of shortness of breath and chronic melanotic stools.  Denies any abdominal pain nausea vomiting or chest pain.  Denies any fever or chills.  ED Course: The ER KUB did not show any features for capsule endoscopy.  Did show some concerning features for bowel obstruction but patient denies any nausea vomiting last bowel movement was around 3 days ago.  On presentation, labs were significant for hemoglobin of 5.2, positive FOBT, INR was 2.1 creatinine 2.8.  Patient was admitted for acute on chronic GI bleed.  Patient has had a recent COVID test positive on December 27, 21.  Patient is asymptomatic.  Post EGD on 05/29/2020 by Dr. Loletha Carrow.  Post colonoscopy on 05/31/2020 by Dr. Henrene Pastor:  - A single tiny non-bleeding angiodysplastic appearing lesion in the ileum. Treated with argon beam coagulation. It is not clear to me that this was the culprit for the significant bleeding noted on capsule endoscopy. However, it was the only lesion noted, thus treated. - The examined portion of the ileum was normal for an extent of 60 cm. Tattoo placed at the proximal extent of the exam. - Diverticulosis in the sigmoid colon. - Status post prior rectosigmoid resection - The examination was otherwise normal on direct and retroflexion views. - No active bleeding.  06/01/20: No new complaints this morning.  No bowel movements.  Seen by GI.  Cardiology consulted for recommendations regarding anticoagulation.  Cardiology recommended to continue to hold Eliquis.  Assessment/Plan: Principal Problem:   Acute GI bleeding Active Problems:   Hypothyroidism    OBSTRUCTIVE SLEEP APNEA   Chronic diastolic CHF (congestive heart failure) (HCC)   Nonischemic cardiomyopathy (HCC)   Type 2 diabetes, controlled, with renal manifestation (HCC)   CKD (chronic kidney disease) stage 4, GFR 15-29 ml/min    Atrial fibrillation, chronic   AVM (arteriovenous malformation) of small bowel, acquired   Anemia   Acute blood loss anemia   COVID-19 virus infection  Acute on chronic GI bleed/acute blood loss anemia due to lower GI bleeding secondary to cecal AVMs She was recently seen by GI, she had capsule endoscopy.  It was unclear whether or not she had passed it. Presented with hemoglobin of 5.2K with positive FOBT Received 2 units PRBCs. Repeated hemoglobin 8.3K from 8.7K Continue to hold off home oral anticoagulation, Eliquis, as recommended by cardiology. Post EGD by Dr. Loletha Carrow on 05/29/2020, successful completion of the video capsule enteroscope placement, results read on 05/30/2020, findings include gastric AVMs previously ablated. Post colonoscopy inpatient due to underlying comorbidities, findings as stated above. Some more drop in hemoglobin down to 7.9.  She has not had any bowel movement since she had bowel prep done.  Feels very weak and still very hesitant to go home.  Still needs to be seen by PT OT.  Due to evidence of slow drop in recurrent GI bleeding and recurrent hospitalizations, I would like to keep her overnight for monitoring of H&H later today and tomorrow morning, will likely go home if hemoglobin is stable.  May need further work-up if further evidence of drop.  Paroxysmal A. fib/A-flutter She  is on Eliquis at home, her last dose was on 05/27/2020 Hold off Eliquis per cardiology. Continue low-dose of Coreg to avoid beta-blockade withdrawal Continue amiodarone at lower dose 100 mg daily  Improving left hip pain post fall, POA Pain control in place along with bowel regimen daily Left hip x-ray no evidence of acute process or fracture Continue  fall precautions.  To be seen by PT OT.  Essential hypertension Currently on amiodarone 100 mg daily, Coreg 3.125 mg twice daily, BIDIL 1 tablet 3 times daily, and torsemide 40 mg daily. Continue to closely monitor blood pressure Maintain MAP greater than 65 if able. Cardiology following.  Chronic diastolic CHF Euvolemic on exam Last 2D echo done on 12/29/2019 showed normal LVEF 50 to 55% with grade 2 diastolic dysfunction Home torsemide 40 mg daily restarted on 06/01/2020 by cardiology Continue strict I's and O's and daily weight Net I&O +2.2 L  Hypothyroidism Continue home levothyroxine  Code Status: Full code  Family Communication: None at bedside.  Disposition Plan: Likely will discharge to home on 06/03/2020, if no evidence of bleeding   Consultants:  GI  Procedures: None  Antimicrobials:  None  DVT prophylaxis: SCDs  Status is: Inpatient   Dispo:  Patient From: Home  Planned Disposition: Home with Health Care Svc with home health services.  Expected discharge date: 06/02/20 Medically stable for discharge: No, ongoing management of suspected upper GI bleed, acute blood loss, anemia requiring blood transfusion.    Subjective: Patient seen and examined.  She complains of feeling weak and very apprehensive of going home.    Objective: Vitals:   06/01/20 1419 06/01/20 1900 06/02/20 0500 06/02/20 1009  BP: (!) 151/66 (!) 143/59 (!) 139/47 (!) 155/60  Pulse: 66 71 68 67  Resp: 18 19 19 17   Temp: 98.9 F (37.2 C) 97.8 F (36.6 C) 98.9 F (37.2 C) 98.8 F (37.1 C)  TempSrc: Oral Oral Oral Oral  SpO2: 100% 98% 98% 100%  Weight:   63.7 kg   Height:        Intake/Output Summary (Last 24 hours) at 06/02/2020 1120 Last data filed at 06/01/2020 1200 Gross per 24 hour  Intake 150 ml  Output --  Net 150 ml   Filed Weights   05/27/20 1344 05/31/20 0955 06/02/20 0500  Weight: 63.5 kg 63.5 kg 63.7 kg    Exam: General exam: Appears calm and comfortable   Respiratory system: Clear to auscultation. Respiratory effort normal. Cardiovascular system: S1 & S2 heard, RRR. No JVD, murmurs, rubs, gallops or clicks. No pedal edema. Gastrointestinal system: Abdomen is nondistended, soft and nontender. No organomegaly or masses felt. Normal bowel sounds heard. Central nervous system: Alert and oriented. No focal neurological deficits. Extremities: Symmetric 5 x 5 power. Skin: No rashes, lesions or ulcers.  Psychiatry: Judgement and insight appear normal. Mood & affect appropriate.   Data Reviewed: CBC: Recent Labs  Lab 05/29/20 0311 05/30/20 0213 05/31/20 0401 06/01/20 0711 06/02/20 0250  WBC 6.0 7.4 7.0 6.9 7.5  HGB 8.2* 8.3* 8.7* 8.3* 7.9*  HCT 24.6* 26.1* 28.4* 25.1* 24.0*  MCV 90.4 91.9 93.7 90.6 90.9  PLT 171 184 204 199 660   Basic Metabolic Panel: Recent Labs  Lab 05/27/20 1357 05/28/20 0230 05/29/20 0311 06/01/20 0711 06/02/20 0250  NA 137 139 134* 138 139  K 4.5 4.0 3.7 4.2 3.9  CL 104 107 103 108 109  CO2 20* 20* 20* 18* 19*  GLUCOSE 120* 86 107* 96 100*  BUN 66*  65* 63* 26* 22  CREATININE 2.89* 2.90* 2.79* 1.90* 2.04*  CALCIUM 9.0 8.7* 8.7* 9.0 8.9  MG  --   --  2.4  --  2.1   GFR: Estimated Creatinine Clearance: 20.2 mL/min (A) (by C-G formula based on SCr of 2.04 mg/dL (H)). Liver Function Tests: Recent Labs  Lab 05/27/20 1357  AST 18  ALT 14  ALKPHOS 75  BILITOT 0.7  PROT 7.0  ALBUMIN 3.6   No results for input(s): LIPASE, AMYLASE in the last 168 hours. No results for input(s): AMMONIA in the last 168 hours. Coagulation Profile: Recent Labs  Lab 05/27/20 1843  INR 2.1*   Cardiac Enzymes: No results for input(s): CKTOTAL, CKMB, CKMBINDEX, TROPONINI in the last 168 hours. BNP (last 3 results) Recent Labs    07/17/19 1352 07/23/19 1033  PROBNP 811.0* 637.0*   HbA1C: No results for input(s): HGBA1C in the last 72 hours. CBG: Recent Labs  Lab 05/28/20 1149 05/28/20 1709 05/28/20 1921  05/29/20 0058 05/29/20 0420  GLUCAP 92 96 134* 126* 99   Lipid Profile: No results for input(s): CHOL, HDL, LDLCALC, TRIG, CHOLHDL, LDLDIRECT in the last 72 hours. Thyroid Function Tests: No results for input(s): TSH, T4TOTAL, FREET4, T3FREE, THYROIDAB in the last 72 hours. Anemia Panel: No results for input(s): VITAMINB12, FOLATE, FERRITIN, TIBC, IRON, RETICCTPCT in the last 72 hours. Urine analysis:    Component Value Date/Time   COLORURINE YELLOW 03/05/2018 0902   APPEARANCEUR CLEAR 03/05/2018 0902   LABSPEC 1.008 03/05/2018 0902   PHURINE 5.0 03/05/2018 0902   GLUCOSEU NEGATIVE 03/05/2018 0902   HGBUR NEGATIVE 03/05/2018 0902   HGBUR negative 02/28/2008 0000   BILIRUBINUR NEGATIVE 03/05/2018 0902   KETONESUR 5 (A) 03/05/2018 0902   PROTEINUR NEGATIVE 03/05/2018 0902   UROBILINOGEN 1.0 07/19/2010 1752   NITRITE NEGATIVE 03/05/2018 0902   LEUKOCYTESUR NEGATIVE 03/05/2018 0902   Sepsis Labs: @LABRCNTIP (procalcitonin:4,lacticidven:4)  ) Recent Results (from the past 240 hour(s))  SARS Coronavirus 2 by RT PCR (hospital order, performed in [place] hospital lab) Nasopharyngeal Nasopharyngeal Swab     Status: None   Collection Time: 05/27/20  8:08 PM   Specimen: Nasopharyngeal Swab  Result Value Ref Range Status   SARS Coronavirus 2 NEGATIVE NEGATIVE Final    Comment: (NOTE) SARS-CoV-2 target nucleic acids are NOT DETECTED.  The SARS-CoV-2 RNA is generally detectable in upper and lower respiratory specimens during the acute phase of infection. The lowest concentration of SARS-CoV-2 viral copies this assay can detect is 250 copies / mL. A negative result does not preclude SARS-CoV-2 infection and should not be used as the sole basis for treatment or other patient management decisions.  A negative result may occur with improper specimen collection / handling, submission of specimen other than nasopharyngeal swab, presence of viral mutation(s) within the areas targeted  by this assay, and inadequate number of viral copies (<250 copies / mL). A negative result must be combined with clinical observations, patient history, and epidemiological information.  Fact Sheet for Patients:   StrictlyIdeas.no  Fact Sheet for Healthcare Providers: BankingDealers.co.za  This test is not yet approved or  cleared by the [place] FDA and has been authorized for detection and/or diagnosis of SARS-CoV-2 by FDA under an Emergency Use Authorization (EUA).  This EUA will remain in effect (meaning this test can be used) for the duration of the COVID-19 declaration under Section 564(b)(1) of the Act, 21 U.S.C. section 360bbb-3(b)(1), unless the authorization is terminated or revoked sooner.  Performed  at [facility], [place] [address] [place], [place] [address]       Studies: No results found.  Scheduled Meds: . amiodarone  100 mg Oral Daily  . carvedilol  3.125 mg Oral BID  . isosorbide-hydrALAZINE  1 tablet Oral TID  . levothyroxine  137 mcg Oral QAC breakfast  . pantoprazole  40 mg Oral Daily  . torsemide  40 mg Oral Daily    Continuous Infusions: . sodium chloride 10 mL/hr at 05/31/20 1102     LOS: 6 days   Darliss Cheney, MD [facility]  If 7PM-7AM, please contact night-coverage [email or web address] Password [facility] 06/02/2020, 11:20 AM

## 2020-06-03 ENCOUNTER — Inpatient Hospital Stay (HOSPITAL_COMMUNITY): Payer: Medicare Other

## 2020-06-03 ENCOUNTER — Encounter (HOSPITAL_COMMUNITY): Payer: Self-pay | Admitting: Internal Medicine

## 2020-06-03 DIAGNOSIS — K922 Gastrointestinal hemorrhage, unspecified: Secondary | ICD-10-CM | POA: Diagnosis not present

## 2020-06-03 LAB — BASIC METABOLIC PANEL
Anion gap: 10 (ref 5–15)
BUN: 22 mg/dL (ref 8–23)
CO2: 20 mmol/L — ABNORMAL LOW (ref 22–32)
Calcium: 8.8 mg/dL — ABNORMAL LOW (ref 8.9–10.3)
Chloride: 107 mmol/L (ref 98–111)
Creatinine, Ser: 2.22 mg/dL — ABNORMAL HIGH (ref 0.44–1.00)
GFR, Estimated: 23 mL/min — ABNORMAL LOW (ref >60)
Glucose, Bld: 105 mg/dL — ABNORMAL HIGH (ref 70–99)
Potassium: 4 mmol/L (ref 3.5–5.1)
Sodium: 137 mmol/L (ref 135–145)

## 2020-06-03 LAB — CBC
HCT: 24.1 % — ABNORMAL LOW (ref 36.0–46.0)
Hemoglobin: 7.6 g/dL — ABNORMAL LOW (ref 12.0–15.0)
MCH: 28.6 pg (ref 26.0–34.0)
MCHC: 31.5 g/dL (ref 30.0–36.0)
MCV: 90.6 fL (ref 80.0–100.0)
Platelets: 167 10*3/uL (ref 150–400)
RBC: 2.66 MIL/uL — ABNORMAL LOW (ref 3.87–5.11)
RDW: 17.4 % — ABNORMAL HIGH (ref 11.5–15.5)
WBC: 6.5 10*3/uL (ref 4.0–10.5)
nRBC: 0 % (ref 0.0–0.2)

## 2020-06-03 LAB — HEMOGLOBIN AND HEMATOCRIT, BLOOD
HCT: 25.8 % — ABNORMAL LOW (ref 36.0–46.0)
Hemoglobin: 8.1 g/dL — ABNORMAL LOW (ref 12.0–15.0)

## 2020-06-03 MED ORDER — CARVEDILOL 12.5 MG PO TABS: 12.5000 mg | ORAL_TABLET | Freq: Two times a day (BID) | ORAL | 0 refills | Status: DC

## 2020-06-03 MED ORDER — CARVEDILOL 12.5 MG PO TABS: 12.5000 mg | ORAL_TABLET | Freq: Two times a day (BID) | ORAL | Status: DC

## 2020-06-03 MED ORDER — AMIODARONE HCL 100 MG PO TABS: 100.0000 mg | ORAL_TABLET | Freq: Every day | ORAL | 0 refills | Status: DC

## 2020-06-03 MED ORDER — TECHNETIUM TC 99M PYROPHOSPHATE
19.4000 | Freq: Once | INTRAVENOUS | Status: AC | PRN
  Administered 2020-06-03: 19.4 via INTRAVENOUS
  Filled 2020-06-03: qty 20

## 2020-06-03 NOTE — Discharge Instructions (Signed)

## 2020-06-03 NOTE — Discharge Summary (Signed)
Physician Discharge Summary  Morgan Clay XTK:[id number] DOB: 1945-08-06 DOA: 05/27/2020  PCP: Patient, No Pcp Per  Admit date: 05/27/2020 Discharge date: 06/03/2020 30 Day Unplanned Readmission Risk Score   Flowsheet Row ED to Hosp-Admission (Current) from 05/27/2020 in [place]  30 Day Unplanned Readmission Risk Score (%) 21.16 Filed at 06/03/2020 1200     This score is the patient's risk of an unplanned readmission within 30 days of being discharged (0 -100%). The score is based on dignosis, age, lab data, medications, orders, and past utilization.   Low:  0-14.9   Medium: 15-21.9   High: 22-29.9   Extreme: 30 and above         Admitted From: Home Disposition: Home  Recommendations for Outpatient Follow-up:  1. Follow up with PCP in 1-2 weeks 2. Please obtain BMP/CBC in one week 3. Please follow up with your PCP on the following pending results: Unresulted Labs (From admission, onward)          Start     Ordered   06/03/20 0500  CBC  Daily,   R     Question:  Specimen collection method  Answer:  Lab=Lab collect   06/02/20 [place]: None Equipment/Devices: 3 in 1 commode  Discharge Condition: Stable CODE STATUS: Full code Diet recommendation: Cardiac  Subjective: Seen and examined.  No complaints.  Brief/Interim Summary: Morgan Clay a 75 y.o.femalewithhistory of chronic anemia and GI bleed who had a recent capsule endoscopy and it was not sure if it left the stomach.  Was advised to come to the ER. On arrival to the ER patient complained of shortness of breath and chronic melanotic stools. KUB did not show any features for capsule endoscopy. Did show some concerning features for bowel obstruction but patient denied any nausea vomiting last bowel movement was around 3 days ago. On presentation, labs were significant for hemoglobin of 5.2, positive FOBT, INR was 2.1 creatinine 2.8.  Patient was  admitted for acute on chronic GI bleed. Patient has had a recent COVID test positive on December 27, 21. Patient was asymptomatic for COVID-19.  She received 2 units of PRBC transfusion.  Repeat hemoglobin was 8.3.  Her Eliquis was held.  Status post EGD by Dr. Loletha Carrow on 05/29/2020 which did not reveal any finding, successful completion of the video capsule enteroscope placement, results read on 05/30/2020, findings include gastric AVMs previously ablated.  Status post colonoscopy inpatient due to underlying comorbidities Blood noted throughout the colon, but not the ileum on that particular exam. She was found to have a large nonbleeding cecal AVM which was ablated with APC therapy.  Since that, patient's hemoglobin remained stable.  She was followed by cardiology who recommended to hold Eliquis for recurrent GI bleed.  They recommended to continue her home torsemide, BiDil, spironolactone but they decreased her amiodarone from 200 mg p.o. daily to 100 mg p.o. daily and decrease her carvedilol from 25 mg p.o. twice daily to 12.5 mg p.o. twice daily.  She was cleared by cardiology as well as GI.  GI recommended no follow up as outpatient.  She also had some left hip pain after the fall prior to admission for which left hip x-ray was done which was negative for any fracture.  She was seen by PT OT who recommended home health PT however patient declined that and she preferred outpatient PT.  She is being discharged  in stable condition.  Discharge Diagnoses:  Principal Problem:   Acute GI bleeding Active Problems:   Hypothyroidism   OBSTRUCTIVE SLEEP APNEA   Chronic diastolic CHF (congestive heart failure) (HCC)   Nonischemic cardiomyopathy (HCC)   Type 2 diabetes, controlled, with renal manifestation (HCC)   CKD (chronic kidney disease) stage 4, GFR 15-29 ml/min    Atrial fibrillation, chronic   AVM (arteriovenous malformation) of small bowel, acquired   Anemia   Acute blood loss anemia   COVID-19 virus  infection   Acute on chronic diastolic CHF (congestive heart failure) ([place])    Discharge Instructions   Allergies as of 06/03/2020   No Known Allergies     Medication List    STOP taking these medications   apixaban 5 MG Tabs tablet Commonly known as: Eliquis     TAKE these medications   allopurinol 300 MG tablet Commonly known as: ZYLOPRIM Take 300 mg by mouth daily.   amiodarone 100 MG tablet Commonly known as: PACERONE Take 1 tablet (100 mg total) by mouth daily. Start taking on: June 04, 2020 What changed:   medication strength  how much to take   BiDil 20-37.5 MG tablet Generic drug: isosorbide-hydrALAZINE Take 1 tablet by mouth 3 (three) times daily.   carvedilol 12.5 MG tablet Commonly known as: COREG Take 1 tablet (12.5 mg total) by mouth 2 (two) times daily. What changed:   medication strength  how much to take   colchicine 0.6 MG tablet Take 0.6 mg by mouth daily.   ferrous sulfate 325 (65 FE) MG tablet Take 1 tablet (325 mg total) by mouth daily.   gabapentin 300 MG capsule Commonly known as: NEURONTIN Take 1 capsule (300 mg total) by mouth 2 (two) times daily.   levothyroxine 137 MCG tablet Commonly known as: SYNTHROID Take 1 tablet (137 mcg total) by mouth daily before breakfast.   nitroGLYCERIN 0.4 MG SL tablet Commonly known as: NITROSTAT Place 0.4 mg under the tongue every 5 (five) minutes x 3 doses as needed for chest pain.   pantoprazole 40 MG tablet Commonly known as: Protonix Take 1 tablet (40 mg total) by mouth daily.   spironolactone 25 MG tablet Commonly known as: ALDACTONE Take 0.5 tablets (12.5 mg total) by mouth daily.   tiZANidine 2 MG tablet Commonly known as: ZANAFLEX Take 1 tablet (2 mg total) by mouth at bedtime as needed for muscle spasms.   torsemide 20 MG tablet Commonly known as: DEMADEX Take 2 tablets (40 mg total) by mouth daily.   traZODone 50 MG tablet Commonly known as: DESYREL Take 50 mg by  mouth at bedtime.            Durable Medical Equipment  (From admission, onward)         Start     Ordered   05/30/20 0622  For home use only DME 3 n 1  Once        05/30/20 0254          Follow-up Information    Irene Shipper, MD Follow up in 4 week(s).   Specialty: Gastroenterology Contact information: [address] [place] [address] [phone]              No Known Allergies  Consultations: Cardiology and GI   Procedures/Studies: CT ABDOMEN PELVIS WO CONTRAST  Result Date: 05/28/2020 CLINICAL DATA:  Abdominal distention. Recent capsule endoscopy on 05/19/2020. EXAM: CT ABDOMEN AND PELVIS WITHOUT CONTRAST TECHNIQUE: Multidetector CT imaging of  the abdomen and pelvis was performed following the standard protocol without IV contrast. COMPARISON:  06/19/2018 FINDINGS: Lower chest: Heart is enlarged without substantial pericardial effusion where visualized. Small right pleural effusion is similar to prior. Hepatobiliary: Nodular liver contour (see inferior right liver on 34/3) suggests cirrhosis. Subtle high attenuation material layering in the gallbladder lumen may be related to sludge or tiny stones. No intrahepatic or extrahepatic biliary dilation. Pancreas: No focal mass lesion. No dilatation of the main duct. No intraparenchymal cyst. No peripancreatic edema. Spleen: No splenomegaly. No focal mass lesion. Adrenals/Urinary Tract: No adrenal nodule or mass. Unremarkable noncontrast appearance of the kidneys. No evidence for hydroureter. The urinary bladder appears normal for the degree of distention. Stomach/Bowel: Stomach is moderately distended with contrast material. Duodenum is normally positioned as is the ligament of Treitz. No small bowel wall thickening. No small bowel dilatation. The terminal ileum is normal. The appendix is not visualized, but there is no edema or inflammation in the region of the cecum. Status post left hemicolectomy. Diverticuli are  seen scattered along the entire length of the colon without CT findings of diverticulitis. Oral contrast material for today's study has migrated through small bowel and colon to the level of the rectum. No retained capsule endoscope in the abdomen or pelvis. Vascular/Lymphatic: There is abdominal aortic atherosclerosis without aneurysm. There is no gastrohepatic or hepatoduodenal ligament lymphadenopathy. No retroperitoneal or mesenteric lymphadenopathy. No pelvic sidewall lymphadenopathy. Reproductive: Stable appearance large calcified uterine fibroid. There is no adnexal mass. Other: No intraperitoneal free fluid. Musculoskeletal: Small left groin hernia contains fluid, stable since prior. No worrisome lytic or sclerotic osseous abnormality. IMPRESSION: 1. No acute findings in the abdomen or pelvis. Specifically, no findings to explain the patient's history of abdominal distention. Oral contrast material has migrated through small bowel and colon to the level of the rectum. No retained capsule endoscope in the abdomen or pelvis. 2. Nodular liver contour suggests cirrhosis. 3. Subtle high attenuation material layering in the gallbladder lumen may be related to sludge or tiny stones. 4. Stable small right pleural effusion. 5. Aortic Atherosclerosis (ICD10-I70.0). Electronically Signed   By: Misty Stanley M.D.   On: 05/28/2020 09:44   DG Ribs Unilateral W/Chest Left  Result Date: 05/27/2020 CLINICAL DATA:  Fall, left rib pain EXAM: LEFT RIBS AND CHEST - 3+ VIEW COMPARISON:  Radiograph 03/01/2020 FINDINGS: Pacer/defibrillator battery pack overlies the left chest wall. No displaced or other visible rib fractures are identified. No acute traumatic abnormalities of the chest wall are clearly evident on radiography. There is some increasing interstitial opacities in a perihilar and basilar predominance with mild vascular cuffing and fissural and septal thickening suggestive of at least mild mild interstitial pulmonary  edema. Pulmonary vascular congestion and mild cardiomegaly are similar to priors. No pneumothorax or effusion. No acute osseous or soft tissue abnormality. Degenerative changes are present in the imaged spine and shoulders. IMPRESSION: 1. No visible displaced rib fractures or other acute traumatic abnormality of the chest wall 2. Features suggestive of CHF with pulmonary edema, cardiomegaly and vascular congestion. 3. No pneumothorax or effusion. Electronically Signed   By: Lovena Le M.D.   On: 05/27/2020 20:22   DG Abdomen 1 View  Result Date: 05/27/2020 CLINICAL DATA:  Capsule endoscopy 8 days prior, concern for retention EXAM: ABDOMEN - 1 VIEW COMPARISON:  Radiograph 05/27/2020, CT 06/19/2018 FINDINGS: Endoscopic capsule is not visualized over the abdomen or pelvis. Redemonstration of surgical clips in the low abdomen as well as heterogeneous calcification  the pelvis compatible with calcified uterine fibroids. Few clustered air-filled loops of bowel in the left hemiabdomen are not significantly distended. No convincing residual high-grade obstructive pattern is seen. No acute osseous or other soft tissue abnormality. Degenerative changes in the spine, hips and pelvis. Some atelectatic changes in the bases. Terminus of a pacer/defibrillator device noted over the lower cardiomediastinal silhouette. IMPRESSION: 1. Endoscopic capsule is not visualized over the abdomen or pelvis. 2. Few clustered air-filled loops of bowel in the left hemiabdomen are not significantly distended, a nonspecific finding on likely reflect high-grade obstruction though should correlate for abdominal symptoms. Electronically Signed   By: Lovena Le M.D.   On: 05/27/2020 20:02   DG Abd 1 View  Result Date: 05/27/2020 CLINICAL DATA:  Evaluate for retained capsule.  Asymptomatic. EXAM: ABDOMEN - 1 VIEW COMPARISON:  08/11/2017 FINDINGS: Single supine view of the abdomen and pelvis. Surgical clips in the upper pelvis. No gaseous  distention of bowel loops. Calcified uterine fibroid is eccentric left. No evidence of metallic retained capsule. IMPRESSION: No evidence of retained capsule or bowel obstruction. Electronically Signed   By: [name] M.D.   On: 05/27/2020 16:06   DG HIP PORT UNILAT WITH PELVIS 1V LEFT  Result Date: 05/30/2020 CLINICAL DATA:  Left hip pain after fall at bath tub. Pain along the iliac crest EXAM: DG HIP (WITH OR WITHOUT PELVIS) 1V PORT LEFT COMPARISON:  Abdominal CT from 2 days ago FINDINGS: Distal colonic contrast from prior CT. Calcified uterine fibroid measuring 6 cm. Negative for hip fracture or subluxation. No evidence of pelvic ring fracture or diastasis. Atherosclerosis. There is a device over the right lower quadrant correlating with history of capsule endoscopy. IMPRESSION: 1. No acute finding. 2. Capsule endoscope over the right lower quadrant. Electronically Signed   By: Monte Fantasia M.D.   On: 05/30/2020 07:38     Discharge Exam: Vitals:   06/03/20 0746 06/03/20 0926  BP: (!) 173/75 (!) 137/57  Pulse: (!) 59 63  Resp: 17 16  Temp: 98.1 F (36.7 C) 97.7 F (36.5 C)  SpO2: 100% 100%   Vitals:   06/03/20 0332 06/03/20 0500 06/03/20 0746 06/03/20 0926  BP: (!) 148/56  (!) 173/75 (!) 137/57  Pulse: (!) 59  (!) 59 63  Resp: 16  17 16   Temp: 97.8 F (36.6 C)  98.1 F (36.7 C) 97.7 F (36.5 C)  TempSrc: Oral  Oral Oral  SpO2: 99%  100% 100%  Weight:  63.6 kg    Height:        General: Pt is alert, awake, not in acute distress Cardiovascular: RRR, S1/S2 +, no rubs, no gallops Respiratory: CTA bilaterally, no wheezing, no rhonchi Abdominal: Soft, NT, ND, bowel sounds + Extremities: no edema, no cyanosis    The results of significant diagnostics from this hospitalization (including imaging, microbiology, ancillary and laboratory) are listed below for reference.     Microbiology: Recent Results (from the past 240 hour(s))  SARS Coronavirus 2 by RT PCR (hospital  order, performed in [facility] hospital lab) Nasopharyngeal Nasopharyngeal Swab     Status: None   Collection Time: 05/27/20  8:08 PM   Specimen: Nasopharyngeal Swab  Result Value Ref Range Status   SARS Coronavirus 2 NEGATIVE NEGATIVE Final    Comment: (NOTE) SARS-CoV-2 target nucleic acids are NOT DETECTED.  The SARS-CoV-2 RNA is generally detectable in upper and lower respiratory specimens during the acute phase of infection. The lowest concentration of SARS-CoV-2 viral copies  this assay can detect is 250 copies / mL. A negative result does not preclude SARS-CoV-2 infection and should not be used as the sole basis for treatment or other patient management decisions.  A negative result may occur with improper specimen collection / handling, submission of specimen other than nasopharyngeal swab, presence of viral mutation(s) within the areas targeted by this assay, and inadequate number of viral copies (<250 copies / mL). A negative result must be combined with clinical observations, patient history, and epidemiological information.  Fact Sheet for Patients:   StrictlyIdeas.no  Fact Sheet for Healthcare Providers: BankingDealers.co.za  This test is not yet approved or  cleared by the [place] FDA and has been authorized for detection and/or diagnosis of SARS-CoV-2 by FDA under an Emergency Use Authorization (EUA).  This EUA will remain in effect (meaning this test can be used) for the duration of the COVID-19 declaration under Section 564(b)(1) of the Act, 21 U.S.C. section 360bbb-3(b)(1), unless the authorization is terminated or revoked sooner.  Performed at [facility], [place] [address], [place], [place] [address]   SARS CORONAVIRUS 2 (TAT 6-24 HRS) Nasopharyngeal Nasopharyngeal Swab     Status: None   Collection Time: 06/02/20  1:45 PM   Specimen: Nasopharyngeal Swab  Result Value Ref Range Status   SARS  Coronavirus 2 NEGATIVE NEGATIVE Final    Comment: (NOTE) SARS-CoV-2 target nucleic acids are NOT DETECTED.  The SARS-CoV-2 RNA is generally detectable in upper and lower respiratory specimens during the acute phase of infection. Negative results do not preclude SARS-CoV-2 infection, do not rule out co-infections with other pathogens, and should not be used as the sole basis for treatment or other patient management decisions. Negative results must be combined with clinical observations, patient history, and epidemiological information. The expected result is Negative.  Fact Sheet for Patients: SugarRoll.be  Fact Sheet for Healthcare Providers: https://www.woods-mathews.com/  This test is not yet approved or cleared by the [place] FDA and  has been authorized for detection and/or diagnosis of SARS-CoV-2 by FDA under an Emergency Use Authorization (EUA). This EUA will remain  in effect (meaning this test can be used) for the duration of the COVID-19 declaration under Se ction 564(b)(1) of the Act, 21 U.S.C. section 360bbb-3(b)(1), unless the authorization is terminated or revoked sooner.  Performed at [facility], [place] [address] [place], [place] [address]      Labs: BNP (last 3 results) Recent Labs    02/04/20 1416  BNP 979*   Basic Metabolic Panel: Recent Labs  Lab 05/28/20 0230 05/29/20 0311 06/01/20 0711 06/02/20 0250 06/03/20 0157  NA 139 134* 138 139 137  K 4.0 3.7 4.2 3.9 4.0  CL 107 103 108 109 107  CO2 20* 20* 18* 19* 20*  GLUCOSE 86 107* 96 100* 105*  BUN 65* 63* 26* 22 22  CREATININE 2.90* 2.79* 1.90* 2.04* 2.22*  CALCIUM 8.7* 8.7* 9.0 8.9 8.8*  MG  --  2.4  --  2.1  --    Liver Function Tests: Recent Labs  Lab 05/27/20 1357  AST 18  ALT 14  ALKPHOS 75  BILITOT 0.7  PROT 7.0  ALBUMIN 3.6   No results for input(s): LIPASE, AMYLASE in the last 168 hours. No results for input(s): AMMONIA in  the last 168 hours. CBC: Recent Labs  Lab 05/30/20 0213 05/31/20 0401 06/01/20 0711 06/02/20 0250 06/02/20 1948 06/03/20 0157 06/03/20 1006  WBC 7.4 7.0 6.9 7.5  --  6.5  --   HGB 8.3* 8.7* 8.3* 7.9* 7.9* 7.6* 8.1*  HCT 26.1* 28.4* 25.1* 24.0* 25.0* 24.1* 25.8*  MCV 91.9 93.7 90.6 90.9  --  90.6  --   PLT 184 204 199 188  --  167  --    Cardiac Enzymes: No results for input(s): CKTOTAL, CKMB, CKMBINDEX, TROPONINI in the last 168 hours. BNP: Invalid input(s): POCBNP CBG: Recent Labs  Lab 05/28/20 1149 05/28/20 1709 05/28/20 1921 05/29/20 0058 05/29/20 0420  GLUCAP 92 96 134* 126* 99   D-Dimer No results for input(s): DDIMER in the last 72 hours. Hgb A1c No results for input(s): HGBA1C in the last 72 hours. Lipid Profile No results for input(s): CHOL, HDL, LDLCALC, TRIG, CHOLHDL, LDLDIRECT in the last 72 hours. Thyroid function studies No results for input(s): TSH, T4TOTAL, T3FREE, THYROIDAB in the last 72 hours.  Invalid input(s): FREET3 Anemia work up No results for input(s): VITAMINB12, FOLATE, FERRITIN, TIBC, IRON, RETICCTPCT in the last 72 hours. Urinalysis    Component Value Date/Time   COLORURINE YELLOW 03/05/2018 0902   APPEARANCEUR CLEAR 03/05/2018 0902   LABSPEC 1.008 03/05/2018 0902   PHURINE 5.0 03/05/2018 0902   GLUCOSEU NEGATIVE 03/05/2018 0902   HGBUR NEGATIVE 03/05/2018 0902   HGBUR negative 02/28/2008 0000   BILIRUBINUR NEGATIVE 03/05/2018 0902   KETONESUR 5 (A) 03/05/2018 0902   PROTEINUR NEGATIVE 03/05/2018 0902   UROBILINOGEN 1.0 07/19/2010 1752   NITRITE NEGATIVE 03/05/2018 0902   LEUKOCYTESUR NEGATIVE 03/05/2018 0902   Sepsis Labs Invalid input(s): PROCALCITONIN,  WBC,  LACTICIDVEN Microbiology Recent Results (from the past 240 hour(s))  SARS Coronavirus 2 by RT PCR (hospital order, performed in [place] hospital lab) Nasopharyngeal Nasopharyngeal Swab     Status: None   Collection Time: 05/27/20  8:08 PM   Specimen:  Nasopharyngeal Swab  Result Value Ref Range Status   SARS Coronavirus 2 NEGATIVE NEGATIVE Final    Comment: (NOTE) SARS-CoV-2 target nucleic acids are NOT DETECTED.  The SARS-CoV-2 RNA is generally detectable in upper and lower respiratory specimens during the acute phase of infection. The lowest concentration of SARS-CoV-2 viral copies this assay can detect is 250 copies / mL. A negative result does not preclude SARS-CoV-2 infection and should not be used as the sole basis for treatment or other patient management decisions.  A negative result may occur with improper specimen collection / handling, submission of specimen other than nasopharyngeal swab, presence of viral mutation(s) within the areas targeted by this assay, and inadequate number of viral copies (<250 copies / mL). A negative result must be combined with clinical observations, patient history, and epidemiological information.  Fact Sheet for Patients:   StrictlyIdeas.no  Fact Sheet for Healthcare Providers: BankingDealers.co.za  This test is not yet approved or  cleared by the [place] FDA and has been authorized for detection and/or diagnosis of SARS-CoV-2 by FDA under an Emergency Use Authorization (EUA).  This EUA will remain in effect (meaning this test can be used) for the duration of the COVID-19 declaration under Section 564(b)(1) of the Act, 21 U.S.C. section 360bbb-3(b)(1), unless the authorization is terminated or revoked sooner.  Performed at [facility], [place] [address] [place], [place] [address]   SARS CORONAVIRUS 2 (TAT 6-24 HRS) Nasopharyngeal Nasopharyngeal Swab     Status: None   Collection Time: 06/02/20  1:45 PM   Specimen: Nasopharyngeal Swab  Result Value Ref Range Status   SARS Coronavirus 2 NEGATIVE NEGATIVE Final  Comment: (NOTE) SARS-CoV-2 target nucleic acids are NOT DETECTED.  The SARS-CoV-2 RNA is generally detectable in  upper and lower respiratory specimens during the acute phase of infection. Negative results do not preclude SARS-CoV-2 infection, do not rule out co-infections with other pathogens, and should not be used as the sole basis for treatment or other patient management decisions. Negative results must be combined with clinical observations, patient history, and epidemiological information. The expected result is Negative.  Fact Sheet for Patients: SugarRoll.be  Fact Sheet for Healthcare Providers: https://www.woods-mathews.com/  This test is not yet approved or cleared by the [place] FDA and  has been authorized for detection and/or diagnosis of SARS-CoV-2 by FDA under an Emergency Use Authorization (EUA). This EUA will remain  in effect (meaning this test can be used) for the duration of the COVID-19 declaration under Se ction 564(b)(1) of the Act, 21 U.S.C. section 360bbb-3(b)(1), unless the authorization is terminated or revoked sooner.  Performed at [facility], [place] [address], [place], [place] [address]      Time coordinating discharge: Over 30 minutes  SIGNED:   Darliss Cheney, MD  [facility] 06/03/2020, 1:03 PM  If 7PM-7AM, please contact night-coverage [email or web address]

## 2020-06-03 NOTE — Care Management Important Message (Signed)
Important Message  Patient Details  Name: Morgan Clay MRN: [id number] Date of Birth: Jul 21, 1945   Medicare Important Message Given:  Yes - Important Message mailed due to current National Emergency  Verbal consent obtained due to current National Emergency  Relationship to patient: Self Contact Name: Keriana Sarsfield Call Date: 06/03/20  Time: 1350 Phone: [phone] Outcome: No Answer/Busy Important Message mailed to: Patient address on file    Delorse Lek 06/03/2020, 1:50 PM

## 2020-06-03 NOTE — Progress Notes (Signed)
Patient ID: Morgan Clay, female   DOB: 01-Jul-1945, 75 y.o.   MRN: [id number]     Advanced Heart Failure Rounding Note  PCP-Cardiologist: No primary care provider on file.   Subjective:    No complaints today, no BM. No dyspnea.   Creatinine mildly higher at 2.2.  Hgb 7.6 today.     Objective:   Weight Range: 63.6 kg Body mass index is 28.32 kg/m.   Vital Signs:   Temp:  [97.7 F (36.5 C)-98.8 F (37.1 C)] 97.7 F (36.5 C) (01/27 0926) Pulse Rate:  [59-105] 63 (01/27 0926) Resp:  [16-19] 16 (01/27 0926) BP: (110-173)/(54-75) 137/57 (01/27 0926) SpO2:  [99 %-100 %] 100 % (01/27 0926) Weight:  [63.6 kg] 63.6 kg (01/27 0500) Last BM Date: 05/31/20  Weight change: Filed Weights   05/31/20 0955 06/02/20 0500 06/03/20 0500  Weight: 63.5 kg 63.7 kg 63.6 kg    Intake/Output:   Intake/Output Summary (Last 24 hours) at 06/03/2020 1006 Last data filed at 06/02/2020 1700 Gross per 24 hour  Intake 240 ml  Output --  Net 240 ml      Physical Exam    General:  Well appearing. No resp difficulty HEENT: Normal Neck: Supple. JVP 8-9 cm. Carotids 2+ bilat; no bruits. No lymphadenopathy or thyromegaly appreciated. Cor: PMI nondisplaced. Regular rate & rhythm. No rubs, gallops or murmurs. Lungs: Clear Abdomen: Soft, nontender, nondistended. No hepatosplenomegaly. No bruits or masses. Good bowel sounds. Extremities: No cyanosis, clubbing, rash, edema Neuro: Alert & orientedx3, cranial nerves grossly intact. moves all 4 extremities w/o difficulty. Affect pleasant   Telemetry   NSR with BiV pacing (personally reviewed)   Labs    CBC Recent Labs    06/02/20 0250 06/02/20 1948 06/03/20 0157  WBC 7.5  --  6.5  HGB 7.9* 7.9* 7.6*  HCT 24.0* 25.0* 24.1*  MCV 90.9  --  90.6  PLT 188  --  315   Basic Metabolic Panel Recent Labs    06/02/20 0250 06/03/20 0157  NA 139 137  K 3.9 4.0  CL 109 107  CO2 19* 20*  GLUCOSE 100* 105*  BUN 22 22  CREATININE 2.04*  2.22*  CALCIUM 8.9 8.8*  MG 2.1  --    Liver Function Tests No results for input(s): AST, ALT, ALKPHOS, BILITOT, PROT, ALBUMIN in the last 72 hours. No results for input(s): LIPASE, AMYLASE in the last 72 hours. Cardiac Enzymes No results for input(s): CKTOTAL, CKMB, CKMBINDEX, TROPONINI in the last 72 hours.  BNP: BNP (last 3 results) Recent Labs    02/04/20 1416  BNP 437*    ProBNP (last 3 results) Recent Labs    07/17/19 1352 07/23/19 1033  PROBNP 811.0* 637.0*     D-Dimer No results for input(s): DDIMER in the last 72 hours. Hemoglobin A1C No results for input(s): HGBA1C in the last 72 hours. Fasting Lipid Panel No results for input(s): CHOL, HDL, LDLCALC, TRIG, CHOLHDL, LDLDIRECT in the last 72 hours. Thyroid Function Tests No results for input(s): TSH, T4TOTAL, T3FREE, THYROIDAB in the last 72 hours.  Invalid input(s): FREET3  Other results:   Imaging     No results found.   Medications:     Scheduled Medications: . amiodarone  100 mg Oral Daily  . carvedilol  12.5 mg Oral BID  . isosorbide-hydrALAZINE  1 tablet Oral TID  . levothyroxine  137 mcg Oral QAC breakfast  . pantoprazole  40 mg Oral Daily  . spironolactone  12.5 mg  Oral Daily  . torsemide  40 mg Oral Daily  . traZODone  50 mg Oral QHS     Infusions: . sodium chloride 10 mL/hr at 05/31/20 1102     PRN Medications:  acetaminophen **OR** acetaminophen, HYDROmorphone (DILAUDID) injection, ondansetron, oxyCODONE    Assessment/Plan   1. NICM: Recovered, now with primarily diastolic CHF:  Echo in 1/51 showed EF improved to 60-65% with MDT BiV pacing. Echo in 10/19 showed stable EF 55-60% with mild-moderate MR, moderate AI. PYP scan in 3/20 was equivocal for transthyretin amyloidosis. However, echo in 9/20 showed EF back down to 45-50%. LHC/RHC in 8/21 showed nonobstructive CAD, normal filling pressures and cardiac output. Last Echo 12/2019, EF 50-55%, G2DD, mildly reduced RV  function, mild MR, Moderate AI.  Mild volume overload on exam.   - Continue home torsemide 40 mg daily.  - Continue Bidil 1 tab tid.  - Continue spironolactone 12.5 daily.  - Can increase Coreg to 12.5 mg bid.  - Planned repeat PYP scan given prior equivocal, ordered yesterday and pending.  2. Atrial fibrillation/flutter: Paroxysmal.  NSR currently.  CHADS2VASC score of 5.   - Continue amiodarone.  - With recurrent GI bleeding, will leave off Eliquis for now.  I think that our best option going forwards will be Watchman device.  I am going to refer her to Dr. Quentin Ore after discharge.  3. CKD 3: Creatinine mildly higher at 2.2, follow.  4. Acute blood loss anemia: Long history multiple bleeding events while on Eliquis and around 3 endoscopy/colonoscopies over the last 3 years. Sigmoid colectomy for recurrent diverticulitis. Eliquis has been stopped and started back in the past.  Cecal avm ablated this admission. No further overt bleeding but hgb down to 7.6.  - CBC repeat planned, would consider 1 more unit PRBCs (per primary service).  - As above, will leave off Eliquis for now and work up for [employer].  5. Cirrhosis: S/p treatment with Harvoni for Hep C, followed by GI outpatient.  No current ascites and LFT's look good.    Length of Stay: 7  Loralie Champagne, MD  06/03/2020, 10:06 AM  Advanced Heart Failure Team Pager [phone] (M-F; 7a - 4p)  Please contact [facility] for night-coverage after hours (4p -7a ) and weekends on [email or web address]

## 2020-06-03 NOTE — TOC Transition Note (Signed)
Transition of Care [facility]) - CM/SW Discharge Note   Patient Details  Name: Javaria Knapke MRN: [id number] Date of Birth: 04-25-1946  Transition of Care [facility]) CM/SW Contact:  Sharin Mons, RN Phone Number: 06/03/2020, 2:41 PM   Clinical Narrative:    Patient will DC to: home  Anticipated DC date: 06/03/2020 Family notified: yes Transport by: car  Admitted with acute GI bleeding, hx of chronic anemia and GI bleed.  . From home with family. States independent with ADL's, no DME usage Per MD patient ready for DC today. RN, patient, and patient's family notified of DC.   Pt without Rx med concerns. Post hospital f/u noted on AVS.  PCP:  Billie Ruddy, MD,  Delbert Harness (Daughter)     [phone]       RNCM will sign off for now as intervention is no longer needed. Please consult [name] again if new needs arise.  Final next level of care: Home/Self Care Barriers to Discharge: No Barriers Identified   Patient Goals and CMS Choice Patient states their goals for this hospitalization and ongoing recovery are:: to get home and get better      Discharge Placement                       Discharge Plan and Services                                     Social Determinants of Health (SDOH) Interventions     Readmission Risk Interventions No flowsheet data found.

## 2020-06-03 NOTE — Progress Notes (Signed)
Pt was given her AVS discharge summary and went over with her. IV was removed with catheter intact. All items gathered for patient to take home. Pt had no further questions.

## 2020-06-07 ENCOUNTER — Other Ambulatory Visit: Payer: Self-pay | Admitting: *Deleted

## 2020-06-07 NOTE — Patient Outreach (Signed)
[facility]) Care Management  06/07/2020  Morgan Clay Nov 15, 1945 [id number]   EMMI-GENERAL DISCHARGE-SUCCESSFULLY RESOLVED RED ON EMMI ALERT Day #1 Date:06/05/2020 Red Alert Reason:UNFILLED MEDICATIONS  OUTREACH #1 RN spoke with pt today concerning the above emmi. Pt indicates this issues has been resolved and she has all her prescribed medications.   RN further introduced [facility] with available program and services along with available pharmacy and social workers for any community resources. Pt has verified all her ongoing medical conditions are managed with no acute needs at this time. Provide contact name and how [facility] can be accessed if needed in the future.  No further needs at this time. Case will be closed.  Raina Mina, RN Care Management Coordinator [place] Office [phone]

## 2020-06-09 ENCOUNTER — Other Ambulatory Visit: Payer: Self-pay | Admitting: *Deleted

## 2020-06-09 NOTE — Patient Outreach (Signed)
[facility]) Care Management  06/09/2020  Morgan Clay April 12, 1946 [id number]   EMMI-GENERAL DISCHARGE-SUCCESSFULLY RESOLVED RED ON EMMI ALERT Day #4 Date:06/08/2020 Red Alert Reason: QUESTIONS/CONCERNS  OUTREACH #1 RN spoke with pt concerning the above emmi. Pt denies any questions or concerns at this time with no additional needs. WIl inquired on any other issues at this time (pt denies).   No further issues to address at this time. Case will be closed.  Raina Mina, RN Care Management Coordinator [place] Office [phone]

## 2020-06-10 ENCOUNTER — Encounter: Payer: Self-pay | Admitting: Internal Medicine

## 2020-06-10 DIAGNOSIS — K558 Other vascular disorders of intestine: Secondary | ICD-10-CM

## 2020-06-10 DIAGNOSIS — K5520 Angiodysplasia of colon without hemorrhage: Secondary | ICD-10-CM

## 2020-06-10 HISTORY — DX: Angiodysplasia of colon without hemorrhage: K55.20

## 2020-06-10 HISTORY — DX: Other vascular disorders of intestine: K55.8

## 2020-06-14 ENCOUNTER — Ambulatory Visit (INDEPENDENT_AMBULATORY_CARE_PROVIDER_SITE_OTHER): Payer: Medicare Other

## 2020-06-14 ENCOUNTER — Ambulatory Visit: Payer: Medicare Other | Admitting: Physical Therapy

## 2020-06-14 DIAGNOSIS — Z95810 Presence of automatic (implantable) cardiac defibrillator: Secondary | ICD-10-CM

## 2020-06-14 DIAGNOSIS — I5042 Chronic combined systolic (congestive) and diastolic (congestive) heart failure: Secondary | ICD-10-CM

## 2020-06-15 NOTE — Progress Notes (Signed)
EPIC Encounter for ICM Monitoring  Patient Name: Morgan Clay is a 75 y.o. female Date: 06/15/2020 Primary Care Physican: Patient, No Pcp Per Primary Cardiologist:[place] Electrophysiologist: Caryl Comes Nephrologist:[facility] Bi-V Pacing:100% 1/17/2022Weight:140lbs  Time in AT/AF0.0 hr/day (0.0%)  Spoke with patient andreports feeling off balance, SOB comes and goes, and weight stable.   She reports bleeding has stopped that was discovered in the small intestine and Eliquis was stopped.       She has been having anemia since December which correlates with decreased impedance.  She has no energy and does not generally feel well  Optivol thoracic impedancesuggesting possible fluid accumulation since 04/05/2020.  Fluid Index > threshold starting 04/17/2020.  Prescribed:  Torsemide20 mgTake 2tablets (40 mg total)by mouth daily.  Spironolactone 25 mg take 0.5 tablet (12.5 mg total) daily  Labs: 06/03/2020 Creatinine 2.22, BUN 22, Potassium 4.0, Sodium 137, GFR 23, HGB 8.1 06/02/2020 Creatinine 2.04, BUN 22, Potassium 3.9, Sodium 139, GFR 25, HGB 7.9 06/01/2020 Creatinine 1.90, BUN 26, Potassium 4.2, Sodium 138, GFR 28, HGB 8.3 05/29/2020 Creatinine 2.79, BUN 63, Potassium 3.7, Sodium 134, GFR 17, HGB 8.2 05/28/2020 Creatinine 2.90, BUN 65, Potassium 4.0, Sodium 139, GFR 17, HGB 6.6 05/27/2020 Creatinine 2.89, BUN 66, Potassium 4.5, Sodium 137, GFR 17, HGB 5.2 04/21/2020 Creatinine 3.01, BUN 65, Potassium 4.1, Sodium 136, GFR 16 04/06/2020 Creatinine3.54, BUN85, Potassium4.3, Sodium132, GFR13 03/17/2020 Creatinine3.05, BUN71, Potassium4.2, Sodium133, HTD42  A complete set of results can be found in Results Review.  Recommendations:Patient prefers to see Dr [name] on Friday, 2/11 to discuss fluid, meds and symptoms.  She does not want to make any med changes until sees him.    Follow-up plan: ICM clinic phone appointment on2/15/2022  to recheck fluid levels.91 day device clinic remote transmission3/29/2022.  EP/Cardiology Office Visits:06/18/2020 with Dr [name].Due to schedule 1 year OV with Dr Caryl Comes for March 2022 (Last telehealth visit was 07/24/2019)  Copy of ICM check sent to [place].No copy sent to Dr [name] due to patient wants to discuss her current condition in the office and be evaluated before making any changes.   3 month ICM trend: 06/14/2020.    1 Year ICM trend:       [name], RN 06/15/2020 1:22 PM

## 2020-06-16 SURGERY — IMAGING PROCEDURE, GI TRACT, INTRALUMINAL, VIA CAPSULE
Anesthesia: LOCAL

## 2020-06-18 ENCOUNTER — Encounter: Payer: Self-pay | Admitting: *Deleted

## 2020-06-18 ENCOUNTER — Ambulatory Visit (HOSPITAL_COMMUNITY)
Admission: RE | Admit: 2020-06-18 | Discharge: 2020-06-18 | Disposition: A | Discharge status: Home / Self Care | Payer: Medicare Other | Source: Ambulatory Visit | Attending: Cardiology | Admitting: Cardiology

## 2020-06-18 ENCOUNTER — Other Ambulatory Visit: Payer: Self-pay

## 2020-06-18 ENCOUNTER — Encounter (HOSPITAL_COMMUNITY): Payer: Self-pay | Admitting: Cardiology

## 2020-06-18 VITALS — BP 140/60 | HR 64 | Wt 142.0 lb

## 2020-06-18 DIAGNOSIS — I5042 Chronic combined systolic (congestive) and diastolic (congestive) heart failure: Secondary | ICD-10-CM | POA: Diagnosis not present

## 2020-06-18 DIAGNOSIS — D500 Iron deficiency anemia secondary to blood loss (chronic): Secondary | ICD-10-CM | POA: Insufficient documentation

## 2020-06-18 DIAGNOSIS — I2510 Atherosclerotic heart disease of native coronary artery without angina pectoris: Secondary | ICD-10-CM | POA: Diagnosis not present

## 2020-06-18 DIAGNOSIS — I428 Other cardiomyopathies: Secondary | ICD-10-CM | POA: Diagnosis not present

## 2020-06-18 DIAGNOSIS — Z79899 Other long term (current) drug therapy: Secondary | ICD-10-CM | POA: Diagnosis not present

## 2020-06-18 DIAGNOSIS — Z79890 Hormone replacement therapy (postmenopausal): Secondary | ICD-10-CM | POA: Insufficient documentation

## 2020-06-18 DIAGNOSIS — I4892 Unspecified atrial flutter: Secondary | ICD-10-CM | POA: Diagnosis not present

## 2020-06-18 DIAGNOSIS — E038 Other specified hypothyroidism: Secondary | ICD-10-CM | POA: Diagnosis not present

## 2020-06-18 DIAGNOSIS — E1122 Type 2 diabetes mellitus with diabetic chronic kidney disease: Secondary | ICD-10-CM | POA: Insufficient documentation

## 2020-06-18 DIAGNOSIS — I5032 Chronic diastolic (congestive) heart failure: Secondary | ICD-10-CM | POA: Diagnosis not present

## 2020-06-18 DIAGNOSIS — Z9049 Acquired absence of other specified parts of digestive tract: Secondary | ICD-10-CM | POA: Diagnosis not present

## 2020-06-18 DIAGNOSIS — Z006 Encounter for examination for normal comparison and control in clinical research program: Secondary | ICD-10-CM

## 2020-06-18 DIAGNOSIS — I130 Hypertensive heart and chronic kidney disease with heart failure and stage 1 through stage 4 chronic kidney disease, or unspecified chronic kidney disease: Secondary | ICD-10-CM | POA: Insufficient documentation

## 2020-06-18 DIAGNOSIS — N183 Chronic kidney disease, stage 3 unspecified: Secondary | ICD-10-CM | POA: Insufficient documentation

## 2020-06-18 LAB — CBC
HCT: 28.1 % — ABNORMAL LOW (ref 36.0–46.0)
Hemoglobin: 8.9 g/dL — ABNORMAL LOW (ref 12.0–15.0)
MCH: 28.5 pg (ref 26.0–34.0)
MCHC: 31.7 g/dL (ref 30.0–36.0)
MCV: 90.1 fL (ref 80.0–100.0)
Platelets: 229 10*3/uL (ref 150–400)
RBC: 3.12 MIL/uL — ABNORMAL LOW (ref 3.87–5.11)
RDW: 18.8 % — ABNORMAL HIGH (ref 11.5–15.5)
WBC: 6.2 10*3/uL (ref 4.0–10.5)
nRBC: 0 % (ref 0.0–0.2)

## 2020-06-18 LAB — POCT I-STAT, CHEM 8
BUN: 33 mg/dL — ABNORMAL HIGH (ref 8–23)
Calcium, Ion: 1.22 mmol/L (ref 1.15–1.40)
Chloride: 104 mmol/L (ref 98–111)
Creatinine, Ser: 2.3 mg/dL — ABNORMAL HIGH (ref 0.44–1.00)
Glucose, Bld: 121 mg/dL — ABNORMAL HIGH (ref 70–99)
HCT: 32 % — ABNORMAL LOW (ref 36.0–46.0)
Hemoglobin: 10.9 g/dL — ABNORMAL LOW (ref 12.0–15.0)
Potassium: 4.2 mmol/L (ref 3.5–5.1)
Sodium: 141 mmol/L (ref 135–145)
TCO2: 24 mmol/L (ref 22–32)

## 2020-06-18 LAB — COMPREHENSIVE METABOLIC PANEL
ALT: 12 U/L (ref 0–44)
AST: 18 U/L (ref 15–41)
Albumin: 3.9 g/dL (ref 3.5–5.0)
Alkaline Phosphatase: 82 U/L (ref 38–126)
Anion gap: 10 (ref 5–15)
BUN: 32 mg/dL — ABNORMAL HIGH (ref 8–23)
CO2: 25 mmol/L (ref 22–32)
Calcium: 9.5 mg/dL (ref 8.9–10.3)
Chloride: 106 mmol/L (ref 98–111)
Creatinine, Ser: 2.54 mg/dL — ABNORMAL HIGH (ref 0.44–1.00)
GFR, Estimated: 20 mL/min — ABNORMAL LOW (ref >60)
Glucose, Bld: 125 mg/dL — ABNORMAL HIGH (ref 70–99)
Potassium: 4.1 mmol/L (ref 3.5–5.1)
Sodium: 141 mmol/L (ref 135–145)
Total Bilirubin: 1 mg/dL (ref 0.3–1.2)
Total Protein: 7.7 g/dL (ref 6.5–8.1)

## 2020-06-18 LAB — TSH: TSH: 5.96 u[IU]/mL — ABNORMAL HIGH (ref 0.350–4.500)

## 2020-06-18 LAB — BRAIN NATRIURETIC PEPTIDE: B Natriuretic Peptide: 1147.8 pg/mL — ABNORMAL HIGH (ref 0.0–100.0)

## 2020-06-18 MED ORDER — POTASSIUM CHLORIDE CRYS ER 20 MEQ PO TBCR
20.0000 meq | EXTENDED_RELEASE_TABLET | Freq: Every day | ORAL | 3 refills | Status: DC
Start: 2020-06-18 — End: 2020-07-12

## 2020-06-18 MED ORDER — FUROSEMIDE 10 MG/ML IJ SOLN
80.0000 mg | Freq: Once | INTRAMUSCULAR | Status: AC
  Administered 2020-06-18: 80 mg via INTRAVENOUS

## 2020-06-18 MED ORDER — TORSEMIDE 20 MG PO TABS: 40.0000 mg | ORAL_TABLET | Freq: Two times a day (BID) | ORAL | 3 refills | Status: DC

## 2020-06-18 NOTE — Research (Signed)
AT [facility] Cardiopulmonary Examination & Physical Examination Summary  Subject 16-009 February 11th, 2022 Day 0 Visit   Cardiopulmonary Exam  Was the CP Exam Performed? [x]  Yes (Complete Below) []  No (Complete Protocol Deviation)      Heart Sounds  S3: [x]  Absent []  Present   S4: [x]  Absent []  Present  Murmur  Murmur: []  Yes [x]  No   If Yes:  []  Systolic []  Diastolic   If yes select grade: []  I/VI []  II/VI []  III/VI []  IV/VI []  V/VI []  VI/VI   Chest Sounds  Rales: []  Yes [x]  No   If yes check one: []  ? 1/3 of lung fields full []  1/2 of lung fields full []  > 1/2 of lung fields full  If yes check one: []  Left []  Right []  Bilateral    Edema   Edema (pedal): []  Absent []  Trace [x]  1+ (slight) []  2+ (moderate) []  3+ (severe)    Hepatomegaly  Hepatomegaly: [x]  None []  < 2 cm []  2-4 cm []  > 4 cm  Peripheral Pulses  Peripheral Pulses: [x]  Normal []  Abnormal   If Abnormal, explain:             Physical Exam   Was the physical Exam Performed?  [x]  Yes        []  No (complete protocol deviation)    . Lungs/Chest?     [x]  Normal        []  Abnormal        []  Not Done  If abnormal, was it clinically significant?     []  Yes   []  No       . Dermatologic (Abdominal Area):   [x]  Normal        []  Abnormal        []  Not Done   If abnormal, was it clinically significant?  []  Yes   []  No       . Periphery:     [x]  Normal          []  Abnormal         []  Not Done   If abnormal, was it clinically significant?  []  Yes    []  No      . Other Body System Examined?  []  Yes        [x]  No          New York Heart Association (NYHA) Heart Failure Classification Indicate the subject's current NYHA Classification at the present time (check only one) Was the NYHA Performed:    [x]  Yes    [] No (Complete Protocol Deviation)     []   Class I:  Patients have cardiac disease but without the resulting limitations of  physical activity. Ordinary physical activity does not cause undue fatigue, palpitation, dyspnea or anginal pain.     []   Class II:    Patients have cardiac disease resulting in slight limitation of physical activity. They are comfortable at rest. Ordinary physical activity results in fatigue, palpitation, dyspnea or anginal pain.     [x]   Class III:  Patients have cardiac disease resulting in marked limitation of physical activity. They are comfortable at rest. Less than ordinary physical activity causes fatigue, palpitation, dyspnea or anginal pain.     []   Class IV: Patients have cardiac disease resulting in inability to carry on any physical activity without discomfort. Symptoms of cardiac insufficiency or of the anginal syndrome may be present even at rest. If any physical activity is  undertaken, discomfort is increased.

## 2020-06-18 NOTE — Research (Signed)
  [employer] Protocol [id number] AT [facility] Pilot  Patient Global Assessment Visual Analog Scale (EQ-VAS)        Subject Number:   16-009  Date Completed:        February 11th, 2022  Study Day:   Day 0       . We would like to know how good or bad your health is TODAY. [name] This scale is numbered 0 to 100. . 100 means the best health you can imagine.       0 means the worst health you can imagine. . Please let me know the number you feel indicated how your health is TODAY.    VAS SCORE - YOUR HEALTH TODAY : 50

## 2020-06-18 NOTE — Addendum Note (Signed)
Addended by: Preston Fleeting C on: 06/18/2020 03:08 PM   Modules accepted: Orders

## 2020-06-18 NOTE — Research (Signed)
  Avoiding Treatment in the Hospital with Furoscix for the Management of Congestion in Heart Failure - A Pilot Study  AT [facility] Pilot     Day 0 Visit  Subject 16-009 February 11th, 2022  [x]  Informed Consent (done before any of the following procedures were completed)  [x]  Limited Physical Exam   [x]  NYHA Classification  [x]  Eligibility Criteria  [x]  Medical History and Subject Demographics   [x]  Administration of the Composite Congestion Score (CCS)  [x]  Administration of the 5-Point Current Dyspnea Score  [x]  Weight & Height  [x]  Vital Signs  [x]  % Lung Fluid Measurement (ReDS)  [x]  Eligibility Criteria Blood work performed along with NT-ProBNP   []  Urine Pregnancy Test on females of childbearing potential - N/A not of childbearing potential  [x]  Administration of the [employer] Cardiomyopathy Questionnaire (KCCQ-12)  [x]  Administration of the patient global assessment visual analog scale (VAS)  [x]  Administration of the six minute walk test  [x]  Assessment of any Adverse Events  [x]  Review of concomitant medications   [x]  Scheduling next study visit

## 2020-06-18 NOTE — Research (Signed)
             Subject 16-009 February 11th, 2022 Day 0 Visit    6 Minute Walk Was the 6 minute walk performed:    [x]  Yes   [] No (Complete Protocol Deviation)  Vitals Before 6 Minute Walk Systolic Blood Pressure (mmHg) Diastolic Blood Pressure (mmHg) Heart Rate (BPM) Respiration Rate (BPM)   137 50 64 18  Vitals After 6 Minute Walk Systolic Blood Pressure (mmHg) Diastolic Blood Pressure (mmHg) Heart Rate (BPM) Respiration Rate (BPM)   138 53 69 24  Measured Before 6 Minute Walk Borg Scale Level of Shortness of breath: Grade 4 - Somewhat severe  Borg Scale Level of Fatigue: Grade 4 - Somewhat severe   Measured After 6 Minute Walk Borg Scale Level of Shortness of breath: Grade 5 - Severe (heavy)  Borg Scale Level of Fatigue: Grade 5 - Severe (heavy)   Distance covered in 6 minutes  183 : meters    Did the subject stop or pause before 6 minutes   [x] Yes (Complete Below)   [] No   Reason for stopping Reason Response If Yes, Comments   Shortness of breath (HF-related)  Yes[x]    No[]  patient felt very short of breath and had to stop to take deep breaths   Light headedness (HF-related)  Yes[]    No[x]     Fatigue (HF-related)  Yes[x]    No[]  patient felt very fatigued and had to stop to catch their breath   Other HF-related symptom  Yes[]    No[x]     Non-HF symptom(s)  Yes[]    No[x]

## 2020-06-18 NOTE — Patient Instructions (Addendum)
Labs done today. We will contact you only if your labs are abnormal.  INCREASE Torsemide 40mg  (2 tablets) by mouth 2 times daily starting tomorrow.  START Potassium 3meq (1 tablet) by mouth daily.  No other medication changes were made. Please continue all current medications as prescribed.  Your physician recommends that you schedule a follow-up appointment on Wednesday February 16th at 11:40am For an appointment with Dr. [name] with an echo afterwards at 2:15pm  Your physician has requested that you have an echocardiogram. Echocardiography is a painless test that uses sound waves to create images of your heart. It provides your doctor with information about the size and shape of your heart and how well your heart's chambers and valves are working. This procedure takes approximately one hour. There are no restrictions for this procedure.   If you have any questions or concerns before your next appointment please send [name] a message through [place] or call our office at [phone].    TO LEAVE A MESSAGE FOR THE NURSE SELECT OPTION 2, PLEASE LEAVE A MESSAGE INCLUDING: . YOUR NAME . DATE OF BIRTH . CALL BACK NUMBER . REASON FOR CALL**this is important as we prioritize the call backs  YOU WILL RECEIVE A CALL BACK THE SAME DAY AS LONG AS YOU CALL BEFORE 4:00 PM   Do the following things EVERYDAY: 1) Weigh yourself in the morning before breakfast. Write it down and keep it in a log. 2) Take your medicines as prescribed 3) Eat low salt foods--Limit salt (sodium) to 2000 mg per day.  4) Stay as active as you can everyday 5) Limit all fluids for the day to less than 2 liters   At the [facility], you and your health needs are our priority. As part of our continuing mission to provide you with exceptional heart care, we have created designated Provider Care Teams. These Care Teams include your primary Cardiologist (physician) and Advanced Practice Providers (APPs- Physician  Assistants and Nurse Practitioners) who all work together to provide you with the care you need, when you need it.   You may see any of the following providers on your designated Care Team at your next follow up: [name] Dr Glori Bickers . Dr Loralie Champagne . Darrick Grinder, NP . Lyda Jester, PA . Audry Riles, PharmD   Please be sure to bring in all your medications bottles to every appointment.

## 2020-06-18 NOTE — Research (Signed)
          Subject 16-009 February 11th, 2022 Day 0 Visit   Composite Congestion Score (CCS) Indicate the subject's current CCS Score at the present time (select for each signs/symptoms) Was CCS Performed:     [x] Yes   [] No (Complete Protocol Deviation)     Signs/Symptoms 0 - None  1 -Seldom 2 -Frequent 3 -Continuous  Dyspnoea []  []  [x]  []   Orthopnoea [x]  []  []  []   Fatigue []  []  [x]  []    Signs/Symptoms 0 - <=6  1 - 6-9 2 - 10-15 3 - >=15  JVD (cm H2O) []  [x]  []  []    Signs/Symptoms 0 -None  1 -Bases 2 -To <50% 3 -To >50%  Rales [x]  []  []  []    Signs/Symptoms 0 -Absent/ trace  1 -Slight 2 -Moderate 3 -Marked  [place] []  [x]  []  []

## 2020-06-18 NOTE — Progress Notes (Signed)
Patient was consented to the AT HOME research study based off of meeting inclusion therapy from their I-STAT machine lab values. Their I-STAT said the creatinine was 2.3 mg/dl and when calculating the GFR it would meet inclusion. However, when the subject's labs resulted from the [facility] levels that were sent off the creatinine was 2.54 mg/dL which brought their GFR down to 20 which meets exclusion criteria number 6 (eGFR less than or equal to 20). The subject was randomized to continued medical therapy, where they did not receive a furoscix device. The study subject is coming back in on Monday February 14th, 2022 for their Day 1 & 3 visit and labs will be drawn.

## 2020-06-18 NOTE — Progress Notes (Addendum)
Subject Name: Morgan Clay  Subject met inclusion and exclusion criteria.  The informed consent form, study requirements and expectations were reviewed with the subject and questions and concerns were addressed prior to the signing of the consent form.  The subject verbalized understanding of the trial requirements.  The subject agreed to participate in the AT HOME trial and signed the informed consent at 1244 on 06/18/20  The informed consent was obtained prior to performance of any protocol-specific procedures for the subject.  A copy of the signed informed consent was given to the subject and a copy was placed in the subject's medical record.   Timoteo Gaul

## 2020-06-19 ENCOUNTER — Telehealth: Payer: Self-pay | Admitting: *Deleted

## 2020-06-20 ENCOUNTER — Telehealth: Payer: Self-pay | Admitting: *Deleted

## 2020-06-20 NOTE — H&P (View-Only) (Signed)
PCP: Dr. Colin Benton Cardiology: Dr. Caryl Comes HF Cardiology: Dr. [name]  75 y.o. with history of nonischemic cardiomyopathy with recovery of EF with BiV pacing, recurrent GI bleeding, and paroxysmal atrial flutter was referred by Dr. Caryl Comes for evaluation of CHF.  Patient has a history of presumed nonischemic cardiomyopathy for a number of years.  Echo in 12/15 showed EF 25-30% and Cardiolite at that time showed no ischemia.  She had Medtronic CRT-D system placed, and EF recovered. In 4/19, echo showed EF 60-65%.    She has additionally had problems with GI bleeding.  She had been on Eliquis for atrial flutter but this was stopped with recurrent GI bleeding.  She had bleeding from a gastric ulcer but was also noted to have multiple small bowel AVMs on capsule endoscopy out of reach of endoscope.  Eliquis was stopped. She has had no melena or BRBPR off Eliquis.   Echo in 10/19 showed EF 55-60%, mild-moderate MR, moderate AI, moderate RV dilation.   She was admitted in 1/20 with diverticulitis and ended up having a sigmoid colectomy.    Echo in 9/20 showed EF lower at 45-50%, moderate AI.   She had LHC/RHC in 8/21 with nonobstructive CAD, normal filling pressures, and preserved cardiac output. Echo 8/21 showed EF 50-55%, mild LVH, mildly decreased RV systolic function, moderate aortic insufficiency.  Back in in A fib the end of October. Amio was increased and DC-CV was set up for November 19th.  Chemically converted so DC-CV canceled.    Seen in HF Clinic in 10/21 and was more fatigued. Found to be in atrial flutter but hgb also 6.9. She was on Eliquis at the time. Loaded on amio and converted to NSR. Patient underwent small bowel enteroscopy 03/02/2020 with findings trace esophageal varix, small superficial AVMs prepyloric stomach with other punctate vascular lesions treated with APC, benign duodenal polyp. Etiology likely secondary to jejunal AVM versus early GAVE vsportal gastropathy.Patient  underwent small bowel enteroscopy 03/02/2020 with findings trace esophageal varix, small superficial AVMs prepyloric stomach with other punctate vascular lesions treated with APC, benign duodenal polyp. Etiology likely secondary to jejunal AVM versus early GAVE vsportal gastropathy. Eliquis restarted.   Hgb dropped back down to 7.7 in 12/21. Saw [name] and capsule placed but got stuck in the stomach.   Had volume overload on device in 05/24/20 and torsemide increased by Dr. [name]   She was admitted on 05/27/20 with dizziness, and a fall preceded by melena found to have further GI bleeding hgb of 5.1 and an AKI.  Eliquis was held and she was transfused 2u PRBC's.  Repeat pill endoscope showed bleeding in distal small bowel.  Colonoscopy found no bleeding but large avm in cecum argon lasered.    OptiVol trending up since 06/14/20, given 80 mg IV lasix x 1 in clinic.   Today she returns for HF follow up. Overall feeling "terrible". SOB at rest and with minimal activity. Says she urinated "normal amount" after IV lasix last week. Usually urinates 2x after both doses of torsemide.  Denies increasing, CP, dizziness, edema, or PND/Orthopnea. Appetite ok. No fever or chills. Weight at home ~137-140 pounds. Taking all medications.   Device Interrogation (personally reviewed): Optivol continues to trend up above threshold, thoracic impedence down, 1-1.5 hrs/day of activity, >99% BiV pacing, No VT/VF/ or AT/AF  Labs (9/19):  Hgb 10.2, K 3.8, creatinine 2.5 Labs (02/26/2018): K 3.4, creatinine 2.2, WBC 76.  Labs (2/20): K 3.9, creatinine 1.8 Labs (8/20): K 3.8,  creatinine 1.98 Labs (3/21): LDL 33 Labs (5/21): K 3.6, creatinine 1.69 Labs (7/21): K 4, creatinine 1.94 Labs (9/21): K 4.5, creatinine 2.24, TSH normal, HFTs normal  Labs (11/21): K 4.2 Creatinine 3.05  Labs (1/22): K 4.0, creatinine 2.22 Labs (2/22): K 4.1, creatinine 2.54  PMH: 1. Diabetes: diet-controlled.  2. Gout 3. HCV:  Treated with Harvoni.  4. Atrial flutter: Atypical.  Had DCCV in 3/19, maintained on amiodarone.  5. HTN 6. CKD stage 3: Sees nephrology, [name].  7. H/o SBO 8. Obesity 9. Nonischemic cardiomyopathy: Echo in 12/15 with EF 25-30%.  Cardiolite in 12/15 with no ischemia.  She had a Medtronic CRT-D device placed with recovery of EF.   - Echo (4/19): EF 60-65% with moderate AI, moderate MR, and PASP 49 mmHg.  - RHC 02/13/2018 RA mean 6, RV 49/7,PA 50/18, mean 31,PCWP mean 20, Oxygen saturations:PA 67% AO 97%, Cardiac Output (Fick) 4.6, Cardiac Index (Fick) 2.9, PVR 2.4 WU - Echo (10/19): EF 55-60%, mild-moderate MR, moderate AI, moderate RV dilation, PASP 60 mmHg.  - PYP scan (3/20): H/CL 1.3, grade 1-2 visually.  Unlikely transthyretin cardiac amyloidosis.  - Echo (9/20): EF 45-50%, moderate aortic insufficiency.  - LHC/RHC (8/21): 40% pLAD, 50% D1; mean RA 3, PA 41/16, mean PCWP 14, CI 2.74, PVR 2.99.  - Echo (8/21): EF 50-55%, mild LVH, grade 2 diastolic dysfunction, normal RV size with mildly decreased systolic function, moderate AI, no AS.  10. GI bleeding: Gastric AVM and also gastric ulcer seen in past.  - 8/19 capsule endoscopy showed small bowel AVMs beyond the reach of endoscopy.  - With recurrent episodes of GI bleeding, Eliquis was stopped.  - Capsule study 1/22 showed bleeding in distal small bowel.  Colonoscopy found no bleeding but large avm in cecum argon lasered.  11. Fe deficiency anemia.  12. Depression. 13. OSA: Uses CPAP.  14. Diverticulitis: Sigmoid colectomy in 1/20.   ROS: All systems reviewed and negative except as per HPI.   Social History   Socioeconomic History  . Marital status: Widowed    Spouse name: Not on file  . Number of children: 1  . Years of education: 52  . Highest education level: Not on file  Occupational History  . Not on file  Tobacco Use  . Smoking status: Never Smoker  . Smokeless tobacco: Never Used  Vaping Use  . Vaping Use: Never  used  Substance and Sexual Activity  . Alcohol use: Yes    Comment: 01/03/2018 "couple drinks/month"  . Drug use: Yes    Types: Marijuana    Comment: VERY INTERMITTENT   . Sexual activity: Not Currently  Other Topics Concern  . Not on file  Social History Narrative   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      Home Situation: lives alone      Spiritual Beliefs: Christian - Baptist      Lifestyle: no regular exercise; poor diet      She is divorced at least one adult child   Never smoker    rare alcohol to occasional at best    no substance abuse         Social Determinants of Radio broadcast assistant Strain: Not on file  Food Insecurity: Not on file  Transportation Needs: Not on file  Physical Activity: Not on file  Stress: Not on file  Social Connections: Not on file  Intimate Partner Violence: Not on file   Family History  Problem Relation Age of Onset  . Asthma Father   . Heart attack Father   . Asthma Sister   . Lung cancer Sister   . Heart attack Mother   . Stroke Brother   . Colon cancer Neg Hx   . Esophageal cancer Neg Hx   . Pancreatic cancer Neg Hx   . Stomach cancer Neg Hx   . Liver disease Neg Hx     Current Outpatient Medications  Medication Sig Dispense Refill  . allopurinol (ZYLOPRIM) 300 MG tablet Take 300 mg by mouth daily.     [name] amiodarone (PACERONE) 100 MG tablet Take 1 tablet (100 mg total) by mouth daily. 30 tablet 0  . carvedilol (COREG) 12.5 MG tablet Take 1 tablet (12.5 mg total) by mouth 2 (two) times daily. 60 tablet 0  . colchicine 0.6 MG tablet Take 0.6 mg by mouth daily.  0  . ferrous sulfate 325 (65 FE) MG tablet Take 1 tablet (325 mg total) by mouth daily. 90 tablet 3  . gabapentin (NEURONTIN) 300 MG capsule Take 1 capsule (300 mg total) by mouth 2 (two) times daily. 180 capsule 2  . isosorbide-hydrALAZINE (BIDIL) 20-37.5 MG tablet Take 1 tablet by mouth 3 (three) times daily. 270 tablet 2  . levothyroxine (SYNTHROID) 137  MCG tablet Take 1 tablet (137 mcg total) by mouth daily before breakfast. 90 tablet 3  . metolazone (ZAROXOLYN) 2.5 MG tablet Take 1 tablet (2.5 mg total) by mouth once for 1 dose. 1 tablet 0  . nitroGLYCERIN (NITROSTAT) 0.4 MG SL tablet Place 0.4 mg under the tongue every 5 (five) minutes x 3 doses as needed for chest pain.     . pantoprazole (PROTONIX) 40 MG tablet Take 1 tablet (40 mg total) by mouth daily. 30 tablet 11  . potassium chloride SA (KLOR-CON) 20 MEQ tablet Take 1 tablet (20 mEq total) by mouth daily. 90 tablet 3  . spironolactone (ALDACTONE) 25 MG tablet Take 0.5 tablets (12.5 mg total) by mouth daily. 45 tablet 3  . tiZANidine (ZANAFLEX) 2 MG tablet Take 1 tablet (2 mg total) by mouth at bedtime as needed for muscle spasms. 30 tablet 0  . torsemide (DEMADEX) 20 MG tablet Take 2 tablets (40 mg total) by mouth 2 (two) times daily. 360 tablet 3  . traZODone (DESYREL) 50 MG tablet Take 50 mg by mouth at bedtime.     No current facility-administered medications for this encounter.   BP 130/80   Pulse 61   Wt 64 kg (141 lb)   SpO2 98%   BMI 28.48 kg/m   Wt Readings from Last 3 Encounters:  06/21/20 64 kg (141 lb)  06/18/20 64.4 kg (142 lb)  06/03/20 63.6 kg (140 lb 3.4 oz)   PHYSICAL EXAM:  Reds 42% General:  NAD. Mildly dyspneic with speaking. HEENT: Normal Neck: Supple. JVD 9-10. Carotids 2+ bilat; no bruits. No lymphadenopathy or thryomegaly appreciated. Cor: PMI nondisplaced. Regular rate & rhythm. No rubs, gallops or murmurs. Lungs: Clear Abdomen: Soft, nontender, + distended. No hepatosplenomegaly. No bruits or masses. Good bowel sounds. Extremities: No cyanosis, clubbing, rash, edema Neuro: alert & oriented x 3, cranial nerves grossly intact. Moves all 4 extremities w/o difficulty. Affect pleasant.  Assessment/Plan: 1. NICM, Recovered, now with primarily diastolic CHF:  Echo in 8/29 showed EF improved to 60-65% with MDT BiV pacing. Echo in 10/19 showed stable EF  55-60% with mild-moderate MR, moderate AI. PYP scan in 3/20 was equivocal for transthyretin amyloidosis.  However, echo in 9/20 showed EF back down to 45-50%. LHC/RHC in 8/21 showed nonobstructive CAD, normal filling pressures and cardiac output. Last Echo 12/2019, EF 50-55%, G2DD, mildly reduced RV function, mild MR, Moderate AI.  NYHA II-III. Volume up on exam, Reds 42% - Metolazone 2.5 mg x 1 + extra 30 mEq of KCl. BMET, BNP & Mag today. - Continue torsemide 40 mg bid.  - Continue Bidil 1 tab tid.  - Continue spironolactone 12.5 mg daily.  - Continue Coreg 12.5 mg bid.  - Repeat PYP scan pending.  - Repeat Echo 2/16. With worsening symptoms, per Dr. [name], will schedule RHC on Wednesday AM. 2. Atrial fibrillation/flutter: Paroxysmal.  Regular on exam.  CHADS2VASC score of 5.   - Continue amiodarone.  - With recurrent GI bleeding, will leave off Eliquis for now.  I think that our best option going forwards will be Watchman device. Reffered to Dr. Quentin Ore. 3. CKD 3: BMET today. Encouraged follow up with nephrologist. 4. Anemia, chronic blood loss: Long history multiple bleeding events while on Eliquis and around 3 endoscopy/colonoscopies over the last 3 years. Sigmoid colectomy for recurrent diverticulitis. Eliquis has been stopped and started back in the past.  Cecal avm ablated this admission. No further overt bleeding but hgb down to 7.6.  - As above, will leave off Eliquis for now and work up for [employer].  5. Cirrhosis: S/p treatment with Harvoni for Hep C, followed by GI outpatient.  No current ascites and LFT's look good.    - Has follow up with Dr. Cephus Richer with Echo. Will add-on RHC for Wednesday AM. - Follow up this Friday, per At Home protocol.   Allena Katz, FNP-BC 06/21/20

## 2020-06-20 NOTE — Progress Notes (Signed)
PCP: Dr. Colin Benton Cardiology: Dr. Caryl Comes HF Cardiology: Dr. [name]  75 y.o. with history of nonischemic cardiomyopathy with recovery of EF with BiV pacing, recurrent GI bleeding, and paroxysmal atrial flutter was referred by Dr. Caryl Comes for evaluation of CHF.  Patient has a history of presumed nonischemic cardiomyopathy for a number of years.  Echo in 12/15 showed EF 25-30% and Cardiolite at that time showed no ischemia.  She had Medtronic CRT-D system placed, and EF recovered. In 4/19, echo showed EF 60-65%.    She has additionally had problems with GI bleeding.  She had been on Eliquis for atrial flutter but this was stopped with recurrent GI bleeding.  She had bleeding from a gastric ulcer but was also noted to have multiple small bowel AVMs on capsule endoscopy out of reach of endoscope.  Eliquis was stopped. She has had no melena or BRBPR off Eliquis.   Echo in 10/19 showed EF 55-60%, mild-moderate MR, moderate AI, moderate RV dilation.   She was admitted in 1/20 with diverticulitis and ended up having a sigmoid colectomy.    Echo in 9/20 showed EF lower at 45-50%, moderate AI.   She had LHC/RHC in 8/21 with nonobstructive CAD, normal filling pressures, and preserved cardiac output. Echo 8/21 showed EF 50-55%, mild LVH, mildly decreased RV systolic function, moderate aortic insufficiency.  Back in in A fib the end of October. Amio was increased and DC-CV was set up for November 19th.  Chemically converted so DC-CV canceled.    Seen in HF Clinic in 10/21 and was more fatigued. Found to be in atrial flutter but hgb also 6.9. She was on Eliquis at the time. Loaded on amio and converted to NSR. Patient underwent small bowel enteroscopy 03/02/2020 with findings trace esophageal varix, small superficial AVMs prepyloric stomach with other punctate vascular lesions treated with APC, benign duodenal polyp. Etiology likely secondary to jejunal AVM versus early GAVE vsportal gastropathy.Patient  underwent small bowel enteroscopy 03/02/2020 with findings trace esophageal varix, small superficial AVMs prepyloric stomach with other punctate vascular lesions treated with APC, benign duodenal polyp. Etiology likely secondary to jejunal AVM versus early GAVE vsportal gastropathy. Eliquis restarted.   Hgb dropped back down to 7.7 in 12/21. Saw [name] and capsule placed but got stuck in the stomach.   Had volume overload on device in 05/24/20 and torsemide increased by Dr. [name]   She was admitted on 05/27/20 with dizziness, and a fall preceded by melena found to have further GI bleeding hgb of 5.1 and an AKI.  Eliquis was held and she was transfused 2u PRBC's.  Repeat pill endoscope showed bleeding in distal small bowel.  Colonoscopy found no bleeding but large avm in cecum argon lasered.    OptiVol trending up since 06/14/20, given 80 mg IV lasix x 1 in clinic.   Today she returns for HF follow up. Overall feeling "terrible". SOB at rest and with minimal activity. Says she urinated "normal amount" after IV lasix last week. Usually urinates 2x after both doses of torsemide.  Denies increasing, CP, dizziness, edema, or PND/Orthopnea. Appetite ok. No fever or chills. Weight at home ~137-140 pounds. Taking all medications.   Device Interrogation (personally reviewed): Optivol continues to trend up above threshold, thoracic impedence down, 1-1.5 hrs/day of activity, >99% BiV pacing, No VT/VF/ or AT/AF  Labs (9/19):  Hgb 10.2, K 3.8, creatinine 2.5 Labs (02/26/2018): K 3.4, creatinine 2.2, WBC 76.  Labs (2/20): K 3.9, creatinine 1.8 Labs (8/20): K 3.8,  creatinine 1.98 Labs (3/21): LDL 33 Labs (5/21): K 3.6, creatinine 1.69 Labs (7/21): K 4, creatinine 1.94 Labs (9/21): K 4.5, creatinine 2.24, TSH normal, HFTs normal  Labs (11/21): K 4.2 Creatinine 3.05  Labs (1/22): K 4.0, creatinine 2.22 Labs (2/22): K 4.1, creatinine 2.54  PMH: 1. Diabetes: diet-controlled.  2. Gout 3. HCV:  Treated with Harvoni.  4. Atrial flutter: Atypical.  Had DCCV in 3/19, maintained on amiodarone.  5. HTN 6. CKD stage 3: Sees nephrology, [name].  7. H/o SBO 8. Obesity 9. Nonischemic cardiomyopathy: Echo in 12/15 with EF 25-30%.  Cardiolite in 12/15 with no ischemia.  She had a Medtronic CRT-D device placed with recovery of EF.   - Echo (4/19): EF 60-65% with moderate AI, moderate MR, and PASP 49 mmHg.  - RHC 02/13/2018 RA mean 6, RV 49/7,PA 50/18, mean 31,PCWP mean 20, Oxygen saturations:PA 67% AO 97%, Cardiac Output (Fick) 4.6, Cardiac Index (Fick) 2.9, PVR 2.4 WU - Echo (10/19): EF 55-60%, mild-moderate MR, moderate AI, moderate RV dilation, PASP 60 mmHg.  - PYP scan (3/20): H/CL 1.3, grade 1-2 visually.  Unlikely transthyretin cardiac amyloidosis.  - Echo (9/20): EF 45-50%, moderate aortic insufficiency.  - LHC/RHC (8/21): 40% pLAD, 50% D1; mean RA 3, PA 41/16, mean PCWP 14, CI 2.74, PVR 2.99.  - Echo (8/21): EF 50-55%, mild LVH, grade 2 diastolic dysfunction, normal RV size with mildly decreased systolic function, moderate AI, no AS.  10. GI bleeding: Gastric AVM and also gastric ulcer seen in past.  - 8/19 capsule endoscopy showed small bowel AVMs beyond the reach of endoscopy.  - With recurrent episodes of GI bleeding, Eliquis was stopped.  - Capsule study 1/22 showed bleeding in distal small bowel.  Colonoscopy found no bleeding but large avm in cecum argon lasered.  11. Fe deficiency anemia.  12. Depression. 13. OSA: Uses CPAP.  14. Diverticulitis: Sigmoid colectomy in 1/20.   ROS: All systems reviewed and negative except as per HPI.   Social History   Socioeconomic History   Marital status: Widowed    Spouse name: Not on file   Number of children: 1   Years of education: 110   Highest education level: Not on file  Occupational History   Not on file  Tobacco Use   Smoking status: Never Smoker   Smokeless tobacco: Never Used  Vaping Use   Vaping Use: Never  used  Substance and Sexual Activity   Alcohol use: Yes    Comment: 01/03/2018 "couple drinks/month"   Drug use: Yes    Types: Marijuana    Comment: VERY INTERMITTENT    Sexual activity: Not Currently  Other Topics Concern   Not on file  Social History Narrative   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      Home Situation: lives alone      Spiritual Beliefs: Christian - Baptist      Lifestyle: no regular exercise; poor diet      She is divorced at least one adult child   Never smoker    rare alcohol to occasional at best    no substance abuse         Social Determinants of Radio broadcast assistant Strain: Not on file  Food Insecurity: Not on file  Transportation Needs: Not on file  Physical Activity: Not on file  Stress: Not on file  Social Connections: Not on file  Intimate Partner Violence: Not on file   Family History  Problem Relation Age of Onset   Asthma Father    Heart attack Father    Asthma Sister    Lung cancer Sister    Heart attack Mother    Stroke Brother    Colon cancer Neg Hx    Esophageal cancer Neg Hx    Pancreatic cancer Neg Hx    Stomach cancer Neg Hx    Liver disease Neg Hx     Current Outpatient Medications  Medication Sig Dispense Refill   allopurinol (ZYLOPRIM) 300 MG tablet Take 300 mg by mouth daily.      amiodarone (PACERONE) 100 MG tablet Take 1 tablet (100 mg total) by mouth daily. 30 tablet 0   carvedilol (COREG) 12.5 MG tablet Take 1 tablet (12.5 mg total) by mouth 2 (two) times daily. 60 tablet 0   colchicine 0.6 MG tablet Take 0.6 mg by mouth daily.  0   ferrous sulfate 325 (65 FE) MG tablet Take 1 tablet (325 mg total) by mouth daily. 90 tablet 3   gabapentin (NEURONTIN) 300 MG capsule Take 1 capsule (300 mg total) by mouth 2 (two) times daily. 180 capsule 2   isosorbide-hydrALAZINE (BIDIL) 20-37.5 MG tablet Take 1 tablet by mouth 3 (three) times daily. 270 tablet 2   levothyroxine (SYNTHROID) 137  MCG tablet Take 1 tablet (137 mcg total) by mouth daily before breakfast. 90 tablet 3   metolazone (ZAROXOLYN) 2.5 MG tablet Take 1 tablet (2.5 mg total) by mouth once for 1 dose. 1 tablet 0   nitroGLYCERIN (NITROSTAT) 0.4 MG SL tablet Place 0.4 mg under the tongue every 5 (five) minutes x 3 doses as needed for chest pain.      pantoprazole (PROTONIX) 40 MG tablet Take 1 tablet (40 mg total) by mouth daily. 30 tablet 11   potassium chloride SA (KLOR-CON) 20 MEQ tablet Take 1 tablet (20 mEq total) by mouth daily. 90 tablet 3   spironolactone (ALDACTONE) 25 MG tablet Take 0.5 tablets (12.5 mg total) by mouth daily. 45 tablet 3   tiZANidine (ZANAFLEX) 2 MG tablet Take 1 tablet (2 mg total) by mouth at bedtime as needed for muscle spasms. 30 tablet 0   torsemide (DEMADEX) 20 MG tablet Take 2 tablets (40 mg total) by mouth 2 (two) times daily. 360 tablet 3   traZODone (DESYREL) 50 MG tablet Take 50 mg by mouth at bedtime.     No current facility-administered medications for this encounter.   BP 130/80    Pulse 61    Wt 64 kg (141 lb)    SpO2 98%    BMI 28.48 kg/m   Wt Readings from Last 3 Encounters:  06/21/20 64 kg (141 lb)  06/18/20 64.4 kg (142 lb)  06/03/20 63.6 kg (140 lb 3.4 oz)   PHYSICAL EXAM:  Reds 42% General:  NAD. Mildly dyspneic with speaking. HEENT: Normal Neck: Supple. JVD 9-10. Carotids 2+ bilat; no bruits. No lymphadenopathy or thryomegaly appreciated. Cor: PMI nondisplaced. Regular rate & rhythm. No rubs, gallops or murmurs. Lungs: Clear Abdomen: Soft, nontender, + distended. No hepatosplenomegaly. No bruits or masses. Good bowel sounds. Extremities: No cyanosis, clubbing, rash, edema Neuro: alert & oriented x 3, cranial nerves grossly intact. Moves all 4 extremities w/o difficulty. Affect pleasant.  Assessment/Plan: 1. NICM, Recovered, now with primarily diastolic CHF:  Echo in 0/35 showed EF improved to 60-65% with MDT BiV pacing. Echo in 10/19 showed stable EF  55-60% with mild-moderate MR, moderate AI. PYP scan in 3/20 was  equivocal for transthyretin amyloidosis. However, echo in 9/20 showed EF back down to 45-50%. LHC/RHC in 8/21 showed nonobstructive CAD, normal filling pressures and cardiac output. Last Echo 12/2019, EF 50-55%, G2DD, mildly reduced RV function, mild MR, Moderate AI.  NYHA II-III. Volume up on exam, Reds 42% - Metolazone 2.5 mg x 1 + extra 30 mEq of KCl. BMET, BNP & Mag today. - Continue torsemide 40 mg bid.  - Continue Bidil 1 tab tid.  - Continue spironolactone 12.5 mg daily.  - Continue Coreg 12.5 mg bid.  - Repeat PYP scan pending.  - Repeat Echo 2/16. With worsening symptoms, per Dr. [name], will schedule RHC on Wednesday AM. 2. Atrial fibrillation/flutter: Paroxysmal.  Regular on exam.  CHADS2VASC score of 5.   - Continue amiodarone.  - With recurrent GI bleeding, will leave off Eliquis for now.  I think that our best option going forwards will be Watchman device. Reffered to Dr. Quentin Ore. 3. CKD 3: BMET today. Encouraged follow up with nephrologist. 4. Anemia, chronic blood loss: Long history multiple bleeding events while on Eliquis and around 3 endoscopy/colonoscopies over the last 3 years. Sigmoid colectomy for recurrent diverticulitis. Eliquis has been stopped and started back in the past.  Cecal avm ablated this admission. No further overt bleeding but hgb down to 7.6.  - As above, will leave off Eliquis for now and work up for [employer].  5. Cirrhosis: S/p treatment with Harvoni for Hep C, followed by GI outpatient.  No current ascites and LFT's look good.    - Has follow up with Dr. Cephus Richer with Echo. Will add-on RHC for Wednesday AM. - Follow up this Friday, per At Home protocol.   Allena Katz, FNP-BC 06/21/20

## 2020-06-20 NOTE — Progress Notes (Signed)
PCP: Dr. Colin Benton Cardiology: Dr. Caryl Comes HF Cardiology: Dr. [name]  75 y.o. with history of nonischemic cardiomyopathy with recovery of EF with BiV pacing, recurrent GI bleeding, and paroxysmal atrial flutter was referred by Dr. Caryl Comes for evaluation of CHF.  Patient has a history of presumed nonischemic cardiomyopathy for a number of years.  Echo in 12/15 showed EF 25-30% and Cardiolite at that time showed no ischemia.  She had Medtronic CRT-D system placed, and EF recovered. In 4/19, echo showed EF 60-65%.    She has additionally had problems with GI bleeding.  She had been on Eliquis for atrial flutter but this was stopped with recurrent GI bleeding.  She had bleeding from a gastric ulcer but was also noted to have multiple small bowel AVMs on capsule endoscopy out of reach of endoscope.  Eliquis was stopped. She has had no melena or BRBPR off Eliquis.   Echo in 10/19 showed EF 55-60%, mild-moderate MR, moderate AI, moderate RV dilation.   She was admitted in 1/20 with diverticulitis and ended up having a sigmoid colectomy.    Echo in 9/20 showed EF lower at 45-50%, moderate AI.   She had LHC/RHC in 8/21 with nonobstructive CAD, normal filling pressures, and preserved cardiac output. Echo 8/21 showed EF 50-55%, mild LVH, mildly decreased RV systolic function, moderate aortic insufficiency.  Back in in A fib in 10/21. Amio was increased and DC-CV was set up for November 19th.  Chemically converted so DC-CV canceled.    Admitted in 1/22 with GI bleeding, had cecal AVM treated with APC.  Given recurrent episodes of severe GI bleeding, Eliquis was stopped. PYP scan in 1/22 was not suggestive of transthyretin amyloidosis.   Today she returns for HF follow up. Weight is up 2 lbs.  She is in NSR today.  She has been feeling badly for a couple of weeks now.  She is short of breath walking across her house and dressing.  She has significant orthopnea.  She gets chest heaviness when she is short of  breath.  No lightheadedness.  I enrolled her in the At-Home study of subcutaneous Lasix today but she randomized to the non-treatment arm.   ECG (personally reviewed): NSR, BiV pacing.   Medtronic device interrogation: fluid index > threshold with decreased thoracic impedance.  No AT/AF.  >99% BiV pacing.   Labs (9/19):  Hgb 10.2, K 3.8, creatinine 2.5 Labs (02/26/2018): K 3.4, creatinine 2.2, WBC 76.  Labs (2/20): K 3.9, creatinine 1.8 Labs (8/20): K 3.8, creatinine 1.98 Labs (3/21): LDL 33 Labs (5/21): K 3.6, creatinine 1.69 Labs (7/21): K 4, creatinine 1.94 Labs (9/21): K 4.5, creatinine 2.24, TSH normal, HFTs normal  Labs 03/17/2020: K 4.2 Creatinine 3.05  Labs (1/22): hgb 8.1, K 4, creatinine 2.22  PMH: 1. Diabetes: diet-controlled.  2. Gout 3. HCV: Treated with Harvoni.  4. Atrial flutter/fibrillation: Atypical flutter, had DCCV in 3/19, maintained on amiodarone.  - Atrial fibrillation in 10/21, went back into NSR w/o DCCV.  5. HTN 6. CKD stage 3: Sees nephrology, [name].  7. H/o SBO 8. Obesity 9. Nonischemic cardiomyopathy: Echo in 12/15 with EF 25-30%.  Cardiolite in 12/15 with no ischemia.  She had a Medtronic CRT-D device placed with recovery of EF.   - Echo (4/19): EF 60-65% with moderate AI, moderate MR, and PASP 49 mmHg.  - RHC 02/13/2018 RA mean 6, RV 49/7,PA 50/18, mean 31,PCWP mean 20, Oxygen saturations:PA 67% AO 97%, Cardiac Output (Fick) 4.6, Cardiac Index (  Fick) 2.9, PVR 2.4 WU - Echo (10/19): EF 55-60%, mild-moderate MR, moderate AI, moderate RV dilation, PASP 60 mmHg.  - PYP scan (3/20): H/CL 1.3, grade 1-2 visually.  Unlikely transthyretin cardiac amyloidosis.  - Echo (9/20): EF 45-50%, moderate aortic insufficiency.  - LHC/RHC (8/21): 40% pLAD, 50% D1; mean RA 3, PA 41/16, mean PCWP 14, CI 2.74, PVR 2.99.  - Echo (8/21): EF 50-55%, mild LVH, grade 2 diastolic dysfunction, normal RV size with mildly decreased systolic function, moderate AI, no AS.  - PYP  scan (1/22): grade 1, H/CL 1.1.  Not suggestive of transthyretin cardiac amyloidosis.  10. GI bleeding: Gastric AVM and also gastric ulcer seen in past.  - 8/19 capsule endoscopy showed small bowel AVMs beyond the reach of endoscopy.  - With recurrent episodes of GI bleeding, Eliquis was stopped.  - 1/22 GIB with cecal AVM treated with APC. 11. Fe deficiency anemia.  12. Depression 13. OSA: Uses CPAP.  14. Diverticulitis: Sigmoid colectomy in 1/20.   Social History   Socioeconomic History  . Marital status: Widowed    Spouse name: Not on file  . Number of children: 1  . Years of education: 90  . Highest education level: Not on file  Occupational History  . Not on file  Tobacco Use  . Smoking status: Never Smoker  . Smokeless tobacco: Never Used  Vaping Use  . Vaping Use: Never used  Substance and Sexual Activity  . Alcohol use: Yes    Comment: 01/03/2018 "couple drinks/month"  . Drug use: Yes    Types: Marijuana    Comment: VERY INTERMITTENT   . Sexual activity: Not Currently  Other Topics Concern  . Not on file  Social History Narrative   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      Home Situation: lives alone      Spiritual Beliefs: Christian - [employer]      Lifestyle: no regular exercise; poor diet      She is divorced at least one adult child   Never smoker    rare alcohol to occasional at best    no substance abuse         Social Determinants of Radio broadcast assistant Strain: Not on file  Food Insecurity: Not on file  Transportation Needs: Not on file  Physical Activity: Not on file  Stress: Not on file  Social Connections: Not on file  Intimate Partner Violence: Not on file   Family History  Problem Relation Age of Onset  . Asthma Father   . Heart attack Father   . Asthma Sister   . Lung cancer Sister   . Heart attack Mother   . Stroke Brother   . Colon cancer Neg Hx   . Esophageal cancer Neg Hx   . Pancreatic cancer Neg Hx   . Stomach  cancer Neg Hx   . Liver disease Neg Hx    ROS: All systems reviewed and negative except as per HPI.   Current Outpatient Medications  Medication Sig Dispense Refill  . allopurinol (ZYLOPRIM) 300 MG tablet Take 300 mg by mouth daily.     [name] amiodarone (PACERONE) 100 MG tablet Take 1 tablet (100 mg total) by mouth daily. 30 tablet 0  . carvedilol (COREG) 12.5 MG tablet Take 1 tablet (12.5 mg total) by mouth 2 (two) times daily. 60 tablet 0  . colchicine 0.6 MG tablet Take 0.6 mg by mouth daily.  0  . ferrous sulfate  325 (65 FE) MG tablet Take 1 tablet (325 mg total) by mouth daily. 90 tablet 3  . gabapentin (NEURONTIN) 300 MG capsule Take 1 capsule (300 mg total) by mouth 2 (two) times daily. 180 capsule 2  . isosorbide-hydrALAZINE (BIDIL) 20-37.5 MG tablet Take 1 tablet by mouth 3 (three) times daily. 270 tablet 2  . levothyroxine (SYNTHROID) 137 MCG tablet Take 1 tablet (137 mcg total) by mouth daily before breakfast. 90 tablet 3  . nitroGLYCERIN (NITROSTAT) 0.4 MG SL tablet Place 0.4 mg under the tongue every 5 (five) minutes x 3 doses as needed for chest pain.     . pantoprazole (PROTONIX) 40 MG tablet Take 1 tablet (40 mg total) by mouth daily. 30 tablet 11  . potassium chloride SA (KLOR-CON) 20 MEQ tablet Take 1 tablet (20 mEq total) by mouth daily. 90 tablet 3  . spironolactone (ALDACTONE) 25 MG tablet Take 0.5 tablets (12.5 mg total) by mouth daily. 45 tablet 3  . tiZANidine (ZANAFLEX) 2 MG tablet Take 1 tablet (2 mg total) by mouth at bedtime as needed for muscle spasms. 30 tablet 0  . traZODone (DESYREL) 50 MG tablet Take 50 mg by mouth at bedtime.    . torsemide (DEMADEX) 20 MG tablet Take 2 tablets (40 mg total) by mouth 2 (two) times daily. 360 tablet 3   No current facility-administered medications for this encounter.   BP 140/60   Pulse 64   Wt 64.4 kg (142 lb)   SpO2 98%   BMI 28.68 kg/m   Wt Readings from Last 3 Encounters:  06/18/20 64.4 kg (142 lb)  06/03/20 63.6 kg  (140 lb 3.4 oz)  05/19/20 63.5 kg (140 lb)   PHYSICAL EXAM:  General: NAD Neck: JVP 12-14 cm, no thyromegaly or thyroid nodule.  Lungs: Clear to auscultation bilaterally with normal respiratory effort. CV: Nondisplaced PMI.  Heart regular S1/S2, no S3/S4, no murmur.  1+ ankle edema.  Abdomen: Soft, nontender, no hepatosplenomegaly, no distention.  Skin: Intact without lesions or rashes.  Neurologic: Alert and oriented x 3.  Psych: Normal affect. Extremities: No clubbing or cyanosis.  HEENT: Normal.   Assessment/Plan: 1. Heart failure with mid-range EF: Patient has history of presumed nonischemic cardiomyopathy (Cardiolite with no ischemia, cath not done with CKD). However, echo in 4/19 showed EF improved to 60-65% with BiV pacing.  Echo in 10/19 showed stable EF 55-60% with mild-moderate MR, moderate AI.  PYP scan in 3/20 was equivocal for transthyretin amyloidosis, PYP scan in 1/22 was not suggestive of transthyretin amyloidosis. Echo in 9/20 showed EF back down to 45-50%.  LHC/RHC in 8/21 showed nonobstructive CAD, normal filling pressures and cardiac output.  Echo 8/21 showed EF 50-55%, mildly decreased RV systolic function, moderate AI.  NYHA class IIIb symptoms currently, she is volume overloaded on exam and by Optivol.  - I will give her a dose of Lasix 80 mg IV x 1 in the office today.  She will then increase torsemide to 40 mg bid and add KCl 20 daily. BMET today and in 1 week.  - Continue Bidil 1 tab tid.  - Continue spironolactone 12.5 daily (CKD limits further titration) - Continue Coreg 12.5 mg bid.  - I will arrange for repeat echo given marked worsening of symptoms.   2. Atrial flutter/fibrillation: History of atypical flutter and atrial fibrillation. Currently in NSR on amiodarone 100 mg daily.  She is off apixaban with recurrent severe GI bleeding.  - Continue amiodarone.  Check LFTs  and TSH today.  She will need a regular eye exam.  - Refer to Dr. Quentin Ore for evaluation for  Watchman device.  3. CKD: Stage 3, followed by nephrology.   - check BMET today.  4. Anemia: Recurrent severe GI bleeding.  She is now off Eliquis.  - CBC today.  - As above, Watchman evaluation.  5. Hypertension: Continue current meds.     Followup in 1 week for reassessment.   Loralie Champagne 06/20/2020

## 2020-06-21 ENCOUNTER — Telehealth (HOSPITAL_COMMUNITY): Payer: Self-pay | Admitting: Cardiology

## 2020-06-21 ENCOUNTER — Other Ambulatory Visit: Payer: Self-pay

## 2020-06-21 ENCOUNTER — Ambulatory Visit (HOSPITAL_BASED_OUTPATIENT_CLINIC_OR_DEPARTMENT_OTHER)
Admission: RE | Admit: 2020-06-21 | Discharge: 2020-06-21 | Disposition: A | Discharge status: Home / Self Care | Payer: Medicare Other | Source: Ambulatory Visit | Attending: Family Medicine | Admitting: Family Medicine

## 2020-06-21 ENCOUNTER — Encounter: Payer: Medicare Other | Admitting: *Deleted

## 2020-06-21 ENCOUNTER — Encounter (HOSPITAL_COMMUNITY): Payer: Self-pay

## 2020-06-21 VITALS — BP 130/80 | HR 61 | Wt 141.0 lb

## 2020-06-21 DIAGNOSIS — I5042 Chronic combined systolic (congestive) and diastolic (congestive) heart failure: Secondary | ICD-10-CM | POA: Diagnosis not present

## 2020-06-21 DIAGNOSIS — Z7901 Long term (current) use of anticoagulants: Secondary | ICD-10-CM | POA: Insufficient documentation

## 2020-06-21 DIAGNOSIS — I5032 Chronic diastolic (congestive) heart failure: Secondary | ICD-10-CM | POA: Insufficient documentation

## 2020-06-21 DIAGNOSIS — I428 Other cardiomyopathies: Secondary | ICD-10-CM

## 2020-06-21 DIAGNOSIS — D500 Iron deficiency anemia secondary to blood loss (chronic): Secondary | ICD-10-CM | POA: Insufficient documentation

## 2020-06-21 DIAGNOSIS — Z79899 Other long term (current) drug therapy: Secondary | ICD-10-CM | POA: Insufficient documentation

## 2020-06-21 DIAGNOSIS — Z006 Encounter for examination for normal comparison and control in clinical research program: Secondary | ICD-10-CM

## 2020-06-21 DIAGNOSIS — Z8619 Personal history of other infectious and parasitic diseases: Secondary | ICD-10-CM | POA: Insufficient documentation

## 2020-06-21 DIAGNOSIS — K7460 Unspecified cirrhosis of liver: Secondary | ICD-10-CM | POA: Insufficient documentation

## 2020-06-21 DIAGNOSIS — I480 Paroxysmal atrial fibrillation: Secondary | ICD-10-CM

## 2020-06-21 DIAGNOSIS — D649 Anemia, unspecified: Secondary | ICD-10-CM

## 2020-06-21 DIAGNOSIS — N183 Chronic kidney disease, stage 3 unspecified: Secondary | ICD-10-CM

## 2020-06-21 DIAGNOSIS — I130 Hypertensive heart and chronic kidney disease with heart failure and stage 1 through stage 4 chronic kidney disease, or unspecified chronic kidney disease: Secondary | ICD-10-CM | POA: Insufficient documentation

## 2020-06-21 DIAGNOSIS — Z8249 Family history of ischemic heart disease and other diseases of the circulatory system: Secondary | ICD-10-CM | POA: Insufficient documentation

## 2020-06-21 DIAGNOSIS — Z95810 Presence of automatic (implantable) cardiac defibrillator: Secondary | ICD-10-CM | POA: Insufficient documentation

## 2020-06-21 DIAGNOSIS — I2510 Atherosclerotic heart disease of native coronary artery without angina pectoris: Secondary | ICD-10-CM | POA: Insufficient documentation

## 2020-06-21 DIAGNOSIS — I4892 Unspecified atrial flutter: Secondary | ICD-10-CM | POA: Insufficient documentation

## 2020-06-21 LAB — BASIC METABOLIC PANEL
Anion gap: 14 (ref 5–15)
BUN: 38 mg/dL — ABNORMAL HIGH (ref 8–23)
CO2: 23 mmol/L (ref 22–32)
Calcium: 9.2 mg/dL (ref 8.9–10.3)
Chloride: 100 mmol/L (ref 98–111)
Creatinine, Ser: 3.25 mg/dL — ABNORMAL HIGH (ref 0.44–1.00)
GFR, Estimated: 15 mL/min — ABNORMAL LOW (ref >60)
Glucose, Bld: 101 mg/dL — ABNORMAL HIGH (ref 70–99)
Potassium: 4.2 mmol/L (ref 3.5–5.1)
Sodium: 137 mmol/L (ref 135–145)

## 2020-06-21 LAB — BRAIN NATRIURETIC PEPTIDE: B Natriuretic Peptide: 981.7 pg/mL — ABNORMAL HIGH (ref 0.0–100.0)

## 2020-06-21 LAB — MAGNESIUM: Magnesium: 2.4 mg/dL (ref 1.7–2.4)

## 2020-06-21 MED ORDER — METOLAZONE 2.5 MG PO TABS: 2.5000 mg | ORAL_TABLET | Freq: Once | ORAL | 0 refills | Status: DC

## 2020-06-21 MED ORDER — TORSEMIDE 20 MG PO TABS: 40.0000 mg | ORAL_TABLET | Freq: Every day | ORAL | 3 refills | Status: DC

## 2020-06-21 NOTE — Research (Signed)
  [employer] Protocol [id number] AT [facility]  Patient Global Assessment Visual Analog Scale (EQ-VAS)        Subject Number:   [id number]  Date Completed:        February 14th, 2022  Study Day:   Day 1 & 3       . We would like to know how good or bad your health is TODAY. [name] This scale is numbered 0 to 100. . 100 means the best health you can imagine.       0 means the worst health you can imagine. . Please let me know the number you feel indicated how your health is TODAY.    VAS SCORE - YOUR HEALTH TODAY : 40

## 2020-06-21 NOTE — Patient Instructions (Addendum)
TAKE one dose of Metoalzone 06/22/2020  TAKE an additional 30 meq of potassium 06/22/2020  Your physician recommends that you schedule a follow-up appointment in: 4 days in the Advanced Practitioners (PA/NP) Clinic   Keep follow up with Dr [name] 06/23/2020  Do the following things EVERYDAY: 1) Weigh yourself in the morning before breakfast. Write it down and keep it in a log. 2) Take your medicines as prescribed 3) Eat low salt foods--Limit salt (sodium) to 2000 mg per day.  4) Stay as active as you can everyday 5) Limit all fluids for the day to less than 2 liters

## 2020-06-21 NOTE — Research (Signed)
Per CRA for the AT HOME study, since the patient was randomized on Friday February 11th, 2022. Saturday 06/19/20 is the Day 2 phone call, Sunday 06/20/20 is the Day 4 phone call, and Monday 06/21/20 will be conducted as the Day 1 & Day 3 in clinic visit date.

## 2020-06-21 NOTE — Telephone Encounter (Addendum)
At Home-HF  Telephone Script Day: 2 phone call    Date of Phone Call: February 12th, 2022                                    Time: 12:00                                                     Subject ID: [id number]        Phone Call Performed:  [x]  Yes  []  No  If No,   []  Subject did not respond   []  Subject unavailable   []  Other, specify ___________      Weight and Blood pressure:  1. Did you use the provided scale to weigh yourself this morning? [x]  Yes  []  No   If Yes, Did you document it in the diary? [x]  Yes []  No  What was your weight today? 138.2 lbs.  If No, please ask to the subject to weigh himself/herself now and obtain the measurement. _____ lbs.     2. Did you use the provided "cuff" to take your Blood Pressure and Heart Rate this morning? [x]  Yes  []  No  If yes, did you document it in the Diary?  [x]  Yes  []  No    What was your Blood Pressure and Heart Rate today?   Heart Rate: 67 bpm           Blood Pressure: 137/72 mmHg         Patient Global Assessment Visual Analog Scale (VAS): Please ask if subject completed the Visual Analog Scale in the Diary.  [x]  Yes []  No If No, Ask to complete now and remind the subject to complete it before next scheduled call or visit.       5-Point Likert Scale: Current Dyspnea Status How much difficulty are you having in breathing now?  []  not short of breath               []  mildly short of breath             []  moderately short of breath             [x]  severely short of breath                  []  very severely short of breath     7-Point Likert Scale: Patient-Assessed Dyspnea Status Compared to how much difficulty you were having with your breathing just before starting in the study, how is your breathing now?  []  Markedly better       []  Moderately better       []  Minimally better       [x]  No change       []  Minimally worse       []  Moderately worse       []  Markedly worse        Medications/Changes and Medical Events:  1. How much oral diuretic did you take yesterday? ___________   2. Did you document in the Diary start time and end time for Furoscix Infusor (if applicable)? []  Yes []  No [x]  N/A   3. Have you had any problems/issues with Infusor? []  Yes []  No [x]  N/A If yes, please  describe: ______________________________________________   4. Since your last visit/call, have you started any new medications or have your medications changed? (review compare to last visit/call)  []  Yes [x]  No If yes, please specify:________________________________________________   5. Since your last visit/call, have you had any medical events, have you visited a hospital, Emergency Department or seen any other health care provider?  []  Yes [x]  No If yes, please describe (including treatments):_____________________________

## 2020-06-21 NOTE — Research (Signed)
AT [facility] Cardiopulmonary Examination & Physical Examination Summary Day 1 & Day 3 Visit  February 14th, 2022 Subject [id number]   Cardiopulmonary Exam  Was the CP Exam Performed? [x]  Yes (Complete Below) []  No (Complete Protocol Deviation)      Heart Sounds  S3: [x]  Absent []  Present   S4: [x]  Absent []  Present  Murmur  Murmur: []  Yes [x]  No   If Yes:  []  Systolic []  Diastolic   If yes select grade: []  I/VI []  II/VI []  III/VI []  IV/VI []  V/VI []  VI/VI   Chest Sounds  Rales: []  Yes [x]  No   If yes check one: []  ? 1/3 of lung fields full []  1/2 of lung fields full []  > 1/2 of lung fields full  If yes check one: []  Left []  Right []  Bilateral    Edema   Edema (pedal): [x]  Absent []  Trace []  1+ (slight) []  2+ (moderate) []  3+ (severe)    Hepatomegaly  Hepatomegaly: [x]  None []  < 2 cm []  2-4 cm []  > 4 cm  Peripheral Pulses  Peripheral Pulses: [x]  Normal []  Abnormal   If Abnormal, explain:             Physical Exam   Was the physical Exam Performed?  []  Yes        []  No (complete protocol deviation)    . Lungs/Chest?     [x]  Normal        []  Abnormal        []  Not Done  If abnormal, was it clinically significant?     []  Yes   []  No       . Dermatologic (Abdominal Area):   [x]  Normal        []  Abnormal        []  Not Done   If abnormal, was it clinically significant?  []  Yes   []  No       . Periphery:     [x]  Normal          []  Abnormal         []  Not Done   If abnormal, was it clinically significant?  []  Yes    []  No      . Other Body System Examined?  []  Yes        [x]  No          New York Heart Association (NYHA) Heart Failure Classification Indicate the subject's current NYHA Classification at the present time (check only one) Was the NYHA Performed:    [x]  Yes    [] No (Complete Protocol Deviation)     []   Class I:  Patients have cardiac disease but without the resulting limitations of  physical activity. Ordinary physical activity does not cause undue fatigue, palpitation, dyspnea or anginal pain.     []   Class II:    Patients have cardiac disease resulting in slight limitation of physical activity. They are comfortable at rest. Ordinary physical activity results in fatigue, palpitation, dyspnea or anginal pain.     [x]   Class III:  Patients have cardiac disease resulting in marked limitation of physical activity. They are comfortable at rest. Less than ordinary physical activity causes fatigue, palpitation, dyspnea or anginal pain.     []   Class IV: Patients have cardiac disease resulting in inability to carry on any physical activity without discomfort. Symptoms of cardiac insufficiency or of the anginal syndrome may be present even at rest. If any  physical activity is undertaken, discomfort is increased.

## 2020-06-21 NOTE — Telephone Encounter (Signed)
Plan changed after visit per Dr Wynema Birch [place],NP Pt aware of [place] via daughter at 41 Labs reviewed and medication changes addressed  Aware to keep follow up with nephrology   Advised if pt has any questions to call office and we would be more than happy to review   You are scheduled for a Cardiac Catheterization on Wednesday, February 16 with Dr. Loralie Champagne.  1. Please arrive at the [facility] (Main Entrance A) at [facility]: [address] [place], [place] [address] at 6:30 AM (This time is two hours before your procedure to ensure your preparation). Free valet parking service is available.   Special note: Every effort is made to have your procedure done on time. Please understand that emergencies sometimes delay scheduled procedures.  2. Diet: Do not eat solid foods after midnight.  The patient may have clear liquids until 5am upon the day of the procedure.  3. Labs: pre procedure labs done 06/21/20  You will need a pre procedure COVID test    WHEN: 06/21/20  anytime between 9am-3pm WHERE: COVID Test Site [address] [place], [place] [address]  This is a drive thru testing site, you will remain in your car. Be sure to get in the line FOR PROCEDURES Once you have been swabbed you will need to remain home in quarantine until you return for your procedure.   4. Medication instructions in preparation for your procedure:   Contrast Allergy: No    Stop taking, Torsemide (Demadex) Wednesday, February 16,    On the morning of your procedure, take your Aspirin and any morning medicines NOT listed above.  You may use sips of water.  5. Plan for one night stay--bring personal belongings. 6. Bring a current list of your medications and current insurance cards. 7. You MUST have a responsible person to drive you home. 8. Someone MUST be with you the first 24 hours after you arrive home or your discharge will be delayed. 9. Please wear clothes that are easy to  get on and off and wear slip-on shoes.  Thank you for allowing [name] to care for you!   -- [facility]

## 2020-06-21 NOTE — Telephone Encounter (Addendum)
At Home-HF  Telephone Script Day: 4 phone call    Date of Phone Call: February 13th, 2022                                    Time: 11:45                                                     Subject ID: [id number]        Phone Call Performed:  [x]  Yes  []  No  If No,   []  Subject did not respond   []  Subject unavailable   []  Other, specify ___________     Weight and Blood pressure:  1. Did you use the provided scale to weigh yourself this morning? []  Yes  [x]  No   If Yes, Did you document it in the diary? [x]  Yes [x]  No  What was your weight today? _____ lbs.  If No, please ask to the subject to weigh himself/herself now and obtain the measurement. _____ lbs.   Subject did not complete, PD completed     2. Did you use the provided "cuff" to take your Blood Pressure and Heart Rate this morning? []  Yes  [x]  No  If yes, did you document it in the Diary?  []  Yes  [x]  No    What was your Blood Pressure and Heart Rate today?  Subject did not complete, PD completed  Heart Rate: _______           Blood Pressure: ________         Patient Global Assessment Visual Analog Scale (VAS): Please ask if subject completed the Visual Analog Scale in the Diary.  [x]  Yes []  No If No, Ask to complete now and remind the subject to complete it before next scheduled call or visit.       5-Point Likert Scale: Current Dyspnea Status How much difficulty are you having in breathing now?  []  not short of breath               []  mildly short of breath             []  moderately short of breath             [x]  severely short of breath                  []  very severely short of breath     7-Point Likert Scale: Patient-Assessed Dyspnea Status Compared to how much difficulty you were having with your breathing just before starting in the study, how is your breathing now?  []  Markedly better       []  Moderately better       []  Minimally better       [x]  No change       []  Minimally  worse       []  Moderately worse       []  Markedly worse       Medications/Changes and Medical Events:  1. How much oral diuretic did you take yesterday?  80 mg Torsemide was taken on 06/19/2020   2. Did you document in the Diary start time and end time for Furoscix Infusor (if applicable)? []  Yes []  No [x]  N/A  3. Have you had any problems/issues with Infusor? []  Yes []  No [x]  N/A If yes, please describe: ______________________________________________   4. Since your last visit/call, have you started any new medications or have your medications changed? (review compare to last visit/call)  []  Yes [x]  No If yes, please specify:________________________________________________   5. Since your last visit/call, have you had any medical events, have you visited a hospital, Emergency Department or seen any other health care provider?  []  Yes [x]  No If yes, please describe (including treatments):_____________________________

## 2020-06-21 NOTE — Research (Signed)
        Day 1 & Day 3 Visit Subject [id number] February 14th, 2022    Composite Congestion Score (CCS) Indicate the subject's current CCS Score at the present time (select for each signs/symptoms) Was CCS Performed:     [x] Yes   [] No (Complete Protocol Deviation)     Signs/Symptoms 0 - None  1 -Seldom 2 -Frequent 3 -Continuous  Dyspnoea []  []  [x]  []   Orthopnoea [x]  []  []  []   Fatigue []  []  [x]  []    Signs/Symptoms 0 - <=6  1 - 6-9 2 - 10-15 3 - >=15  JVD (cm H2O) []  []  [x]  []    Signs/Symptoms 0 -None  1 -Bases 2 -To <50% 3 -To >50%  Rales [x]  []  []  []    Signs/Symptoms 0 -Absent/ trace  1 -Slight 2 -Moderate 3 -Marked  Oedema [x]  []  []  []

## 2020-06-22 ENCOUNTER — Telehealth: Payer: Self-pay | Admitting: *Deleted

## 2020-06-22 ENCOUNTER — Encounter: Payer: Self-pay | Admitting: Physical Therapy

## 2020-06-22 ENCOUNTER — Other Ambulatory Visit (HOSPITAL_COMMUNITY): Payer: Self-pay | Admitting: *Deleted

## 2020-06-22 ENCOUNTER — Ambulatory Visit: Payer: Medicare Other | Admitting: Physical Therapy

## 2020-06-22 ENCOUNTER — Other Ambulatory Visit (HOSPITAL_COMMUNITY)
Admission: RE | Admit: 2020-06-22 | Discharge: 2020-06-22 | Disposition: A | Discharge status: Home / Self Care | Payer: Medicare Other | Source: Ambulatory Visit | Attending: Cardiology | Admitting: Cardiology

## 2020-06-22 VITALS — BP 149/79 | HR 67 | Resp 16

## 2020-06-22 DIAGNOSIS — I5042 Chronic combined systolic (congestive) and diastolic (congestive) heart failure: Secondary | ICD-10-CM

## 2020-06-22 DIAGNOSIS — M25552 Pain in left hip: Secondary | ICD-10-CM

## 2020-06-22 DIAGNOSIS — R262 Difficulty in walking, not elsewhere classified: Secondary | ICD-10-CM | POA: Insufficient documentation

## 2020-06-22 DIAGNOSIS — Z7409 Other reduced mobility: Secondary | ICD-10-CM | POA: Insufficient documentation

## 2020-06-22 DIAGNOSIS — Z01812 Encounter for preprocedural laboratory examination: Secondary | ICD-10-CM | POA: Insufficient documentation

## 2020-06-22 DIAGNOSIS — Z20822 Contact with and (suspected) exposure to covid-19: Secondary | ICD-10-CM | POA: Insufficient documentation

## 2020-06-22 LAB — SARS CORONAVIRUS 2 (TAT 6-24 HRS): SARS Coronavirus 2: NEGATIVE

## 2020-06-22 NOTE — Telephone Encounter (Signed)
  [employer] Protocol [id number] AT [facility]  Patient Global Assessment Visual Analog Scale (EQ-VAS)        Subject Number:   [id number]  Date Completed:        February 12th, 2022  Study Day:   Day 2 phone call       . We would like to know how good or bad your health is TODAY. [name] This scale is numbered 0 to 100. . 100 means the best health you can imagine.       0 means the worst health you can imagine. . Please let me know the number you feel indicated how your health is TODAY.    VAS SCORE - YOUR HEALTH TODAY : 45

## 2020-06-22 NOTE — Progress Notes (Signed)
ICM remote transmission rescheduled for 07/05/2020 since fluid levels were rechecked at HF clinic OV on 2/15.

## 2020-06-22 NOTE — Telephone Encounter (Signed)
  [employer] Protocol [id number] AT [facility] Pilot  Patient Global Assessment Visual Analog Scale (EQ-VAS)        Subject Number:   16-009  Date Completed:        February 13th, 2022  Study Day:   Day 4 Phone Call       . We would like to know how good or bad your health is TODAY. [name] This scale is numbered 0 to 100. . 100 means the best health you can imagine.       0 means the worst health you can imagine. . Please let me know the number you feel indicated how your health is TODAY.    VAS SCORE - YOUR HEALTH TODAY : 45

## 2020-06-22 NOTE — Patient Instructions (Signed)
Access Code: [email or web address] URL: [email or web address] Date: 06/22/2020 Prepared by: Amador Cunas  Exercises Clamshell - 1 x daily - 7 x weekly - 3 sets - 10 reps Supine Lower Trunk Rotation - 1 x daily - 7 x weekly - 3 sets - 5 reps - 5 sec hold Beginner Bridge - 1 x daily - 7 x weekly - 3 sets - 5 reps Sit to Stand with Hands on Knees - 1 x daily - 7 x weekly - 3 sets - 5 reps

## 2020-06-22 NOTE — Therapy (Signed)
[address] [place]. [place], [place], [address] Phone: [phone]   Fax:  [phone]  Physical Therapy Evaluation  Patient Details  Name: Morgan Clay MRN: [id number] Date of Birth: 12/30/1945 Referring Provider (PT): Pahwani   Encounter Date: 06/22/2020   PT End of Session - 06/22/20 1006    Visit Number 1    Date for PT Re-Evaluation 08/20/20    Authorization Type Due to $30 copay, pt can only come 1x/week    PT Start Time 0926    PT Stop Time 1000    PT Time Calculation (min) 34 min    Activity Tolerance Patient tolerated treatment well;Patient limited by fatigue    Behavior During Therapy [facility] for tasks assessed/performed           Past Medical History:  Diagnosis Date  . A-fib ([place])    on Eliquis  . AICD (automatic cardioverter/defibrillator) present 10/06/2015  . Anemia   . Angiodysplasia of small intestine ([place]) 06/10/2020   Ileum - seen on capsule endoscopy 05/2020 - ablated at colonoscopy  . ARF (acute renal failure) ([place])   . Arthritis    "qwhere" (01/03/2018)  . Asthma    reports mild asthma since childhood - had COPD on dx list from prior PCP  . Chronic bronchitis ([place])    "get it most years; not this past year though" (01/03/2018)  . Chronic kidney disease    "? stage;  followed by [facility]; [name]" (01/03/2018)  . Chronic systolic congestive heart failure ([place])   . COVID-19 05/03/2020  . DEPRESSION 12/17/2009   Annotation: PHQ-9 score = 14 done on 12/17/2009 Qualifier: Diagnosis of  By: Hassell Done FNP, Tori Milks    . Diabetes mellitus without complication (HCC)    DIET CONTROLLED   . Diverticulosis   . Enteritis   . FIBROIDS, UTERUS 03/05/2008  . GI bleeding 01/03/2018  . Glaucoma   . Gout   . Hepatitis C    HEPATITIS C - s/p treatment with Harvoni, saw hepatology, Dawn Drazek  . History of blood transfusion ~ 11/2017  . Hyperlipemia 12/06/2012  . HYPERTENSION, BENIGN 04/24/2007  .  Hypothyroidism   . LBBB (left bundle branch block)   . Lower GI bleeding    "been dealing w/it since 07/2017" (01/03/2018)  . Nonischemic cardiomyopathy ([place])   . OBESITY 05/27/2009  . OBSTRUCTIVE SLEEP APNEA 11/14/2007   no CPAP  . OSTEOPENIA 09/30/2008  . Pain and swelling of left upper extremity 08/08/2017  . Personal history of other infectious and parasitic disease    Hepatitis B  . Pneumonia    "several times" (01/03/2018)  . Pulmonary edema, acute ([place]) 08/09/2017    Past Surgical History:  Procedure Laterality Date  . APPENDECTOMY    . CARDIAC CATHETERIZATION    . CARDIOVERSION N/A 12/14/2014   Procedure: CARDIOVERSION;  Surgeon: Dorothy Spark, MD;  Location[facility] ENDOSCOPY;  Service: Cardiovascular;  Laterality: N/A;  . CARDIOVERSION N/A 02/26/2017   Procedure: CARDIOVERSION;  Surgeon: Evans Lance, MD;  Location: [place] CV LAB;  Service: Cardiovascular;  Laterality: N/A;  . CARDIOVERSION N/A 07/24/2017   Procedure: CARDIOVERSION;  Surgeon: Thayer Headings, MD;  Location: [facility] ENDOSCOPY;  Service: Cardiovascular;  Laterality: N/A;  . CARDIOVERSION N/A 02/12/2020   Procedure: CARDIOVERSION;  Surgeon: [name], MD;  Location: [facility] ENDOSCOPY;  Service: Cardiovascular;  Laterality: N/A;  . COLONOSCOPY N/A 11/08/2017   Procedure: COLONOSCOPY;  Surgeon: Milus Banister, MD;  Location: [facility] ENDOSCOPY;  Service: Endoscopy;  Laterality: N/A;  . COLONOSCOPY WITH PROPOFOL N/A 05/31/2020   Procedure: COLONOSCOPY WITH PROPOFOL;  Surgeon: Irene Shipper, MD;  Location: [facility] ENDOSCOPY;  Service: Endoscopy;  Laterality: N/A;  . DILATION AND CURETTAGE OF UTERUS    . ENTEROSCOPY N/A 03/02/2020   Procedure: ENTEROSCOPY;  Surgeon: Yetta Flock, MD;  Location: [facility] ENDOSCOPY;  Service: Gastroenterology;  Laterality: N/A;  . EP IMPLANTABLE DEVICE N/A 10/06/2015   Procedure: BIV ICD Generator Changeout;  Surgeon: [name], MD;  Location: [place] CV LAB;  Service: Cardiovascular;   Laterality: N/A;  . ESOPHAGOGASTRODUODENOSCOPY N/A 10/26/2017   Procedure: ESOPHAGOGASTRODUODENOSCOPY (EGD);  Surgeon: Ladene Artist, MD;  Location: [facility] ENDOS[facility];  Service: Endoscopy;  Laterality: N/A;  . ESOPHAGOGASTRODUODENOSCOPY (EGD) WITH PROPOFOL N/A 11/07/2017   Procedure: ESOPHAGOGASTRODUODENOSCOPY (EGD) WITH PROPOFOL;  Surgeon: Milus Banister, MD;  Location: [facility] ENDOSCOPY;  Service: Endoscopy;  Laterality: N/A;  . ESOPHAGOGASTRODUODENOSCOPY (EGD) WITH PROPOFOL N/A 05/29/2020   Procedure: ESOPHAGOGASTRODUODENOSCOPY (EGD) WITH PROPOFOL;  Surgeon: Doran Stabler, MD;  Location: [place];  Service: Gastroenterology;  Laterality: N/A;  . GIVENS CAPSULE STUDY N/A 05/19/2020   Procedure: GIVENS CAPSULE STUDY;  Surgeon: [name], MD;  Location: [place];  Service: Endoscopy;  Laterality: N/A;  .adm for obs since pacemaker, PA wil enter order and see pt  . GIVENS CAPSULE STUDY N/A 05/29/2020   Procedure: GIVENS CAPSULE STUDY;  Surgeon: Doran Stabler, MD;  Location: [place];  Service: Gastroenterology;  Laterality: N/A;  . HOT HEMOSTASIS N/A 10/26/2017   Procedure: HOT HEMOSTASIS (ARGON PLASMA COAGULATION/BICAP);  Surgeon: Ladene Artist, MD;  Location: [facility] ENDOS[facility];  Service: Endoscopy;  Laterality: N/A;  . HOT HEMOSTASIS N/A 03/02/2020   Procedure: HOT HEMOSTASIS (ARGON PLASMA COAGULATION/BICAP);  Surgeon: Yetta Flock, MD;  Location: [facility] ENDOSCOPY;  Service: Gastroenterology;  Laterality: N/A;  . HOT HEMOSTASIS N/A 05/31/2020   Procedure: HOT HEMOSTASIS (ARGON PLASMA COAGULATION/BICAP);  Surgeon: Irene Shipper, MD;  Location: [facility] ENDOSCOPY;  Service: Endoscopy;  Laterality: N/A;  . INSERT / REPLACE / REMOVE PACEMAKER  ?2008  . POLYPECTOMY  11/08/2017   Procedure: POLYPECTOMY;  Surgeon: Milus Banister, MD;  Location: [facility] ENDOSCOPY;  Service: Endoscopy;;  . PROCTOSCOPY N/A 05/22/2018   Procedure: RIGID PROCTOSCOPY;  Surgeon: Michael Boston, MD;  Location: [facility] ORS;   Service: General;  Laterality: N/A;  . RIGHT HEART CATH N/A 02/13/2018   Procedure: RIGHT HEART CATH;  Surgeon: [name], MD;  Location: [place] CV LAB;  Service: Cardiovascular;  Laterality: N/A;  . RIGHT/LEFT HEART CATH AND CORONARY ANGIOGRAPHY N/A 12/11/2019   Procedure: RIGHT/LEFT HEART CATH AND CORONARY ANGIOGRAPHY;  Surgeon: [name], MD;  Location: [place] CV LAB;  Service: Cardiovascular;  Laterality: N/A;  . SUBMUCOSAL TATTOO INJECTION  03/02/2020   Procedure: SUBMUCOSAL TATTOO INJECTION;  Surgeon: Yetta Flock, MD;  Location: [facility] ENDOSCOPY;  Service: Gastroenterology;;  . Lia Foyer TATTOO INJECTION  05/31/2020   Procedure: SUBMUCOSAL TATTOO INJECTION;  Surgeon: Irene Shipper, MD;  Location: [place];  Service: Endoscopy;;  . TUBAL LIGATION    . XI ROBOTIC ASSISTED LOWER ANTERIOR RESECTION N/A 05/22/2018   Procedure: XI ROBOTIC ASSISTED SIGMOID COLOECTOMY MOBILIZATION OF SPLENIC FLEXURE, FIREFLY ASSESSMENT OF PERFUSION;  Surgeon: Michael Boston, MD;  Location: [facility] ORS;  Service: General;  Laterality: N/A;  ERAS PATHWAY    Vitals:   06/22/20 0929  BP: (!) 149/79  Pulse: 67  Resp: 16  SpO2: 98%      Subjective Assessment - 06/22/20 0929    Subjective Pt reports fall ~2 weeks ago; states she fell on L side. States it has been hurting since then. Pt went to hospital the same day; states that she had xrays and was neg for any fractures. Denies radiating pain in LLE. Pt states she might have passed out. Pt states sitting leaning to L side, sleeping on L side, and prolonged standing/walking all aggravating factors. Pt also has been having a lot of trouble with SOB and endurance. States that she gets out of breath even walking from car to inside clinic. Pt would like [name] to help address hip pain and would like to be able to walk a little better. Pt states that she occasionally walks with rollator at home when she feels off balance.    Pertinent History hx of  acute GI bleed (was on eliquis), CHF, atrial flutter, DM, htn, pulmonary htn, afib    Limitations Standing;Walking    Diagnostic tests xrays    Patient Stated Goals be able to walk better, get rid of pain in L hip    Currently in Pain? Yes    Pain Score 5     Pain Location Hip    Pain Orientation Left    Pain Descriptors / Indicators Aching;Sharp    Pain Type Acute pain    Pain Onset 1 to 4 weeks ago    Pain Frequency Constant    Aggravating Factors  sitting leaning to L side, prolonged standing/walking, sleeping on L side    Pain Relieving Factors pain meds, rest              [facility] PT Assessment - 06/22/20 0001      Assessment   Medical Diagnosis L hip pain and deconditioning    Referring Provider (PT) Pahwani    Hand Dominance Right    Prior Therapy yes for shoulder pain      Precautions   Precaution Comments --   monitor vitals     Restrictions   Weight Bearing Restrictions No      Balance Screen   Has the patient fallen in the past 6 months Yes    How many times? 1    Has the patient had a decrease in activity level because of a fear of falling?  No    Is the patient reluctant to leave their home because of a fear of falling?  No      Home Ecologist residence    Living Arrangements Children   daughter lives with her   Type of Green      Prior Function   Level of Independence Independent    Vocation Retired    Leisure watch t.v, likes to go shopping      Functional Tests   Functional tests Sit to Stand      Sit to Stand   Comments leaning to RLE, excessive use of UEs      Posture/Postural Control   Posture/Postural Control Postural limitations    Postural Limitations Rounded Shoulders;Forward head      ROM / Strength   AROM / PROM / Strength AROM;Strength      AROM   Overall AROM Comments lumbar AROM limited 50% with pain      Strength   Strength Assessment Site Hip;Knee    Right/Left Hip Right;Left    Right  Hip Flexion 4/5  Right Hip Extension 4-/5    Right Hip ABduction 4-/5    Left Hip Flexion 3+/5    Left Hip Extension 3/5    Left Hip ABduction 3/5    Right/Left Knee Right;Left    Right Knee Flexion 4+/5    Right Knee Extension 4+/5    Left Knee Flexion 4/5    Left Knee Extension 4/5      Flexibility   Soft Tissue Assessment /Muscle Length yes    Hamstrings tight    ITB tight    Piriformis tight      Palpation   Palpation comment very tender to palpation L greater troch, L ITB, L glute med/piriformis      Special Tests    Special Tests Hip Special Tests    Hip Special Tests  Ober's Test      Ober's Test   Findings Positive    Side Left      Transfers   Five time sit to stand comments  unable secondary to SOB      Ambulation/Gait   Gait Comments instability with gait increasing with fatigue                      Objective measurements completed on examination: See above findings.       [facility] Adult PT Treatment/Exercise - 06/22/20 0001      Exercises   Exercises Knee/Hip      Knee/Hip Exercises: Stretches   Other Knee/Hip Stretches lower trunk rotation x5 with 5 sec hold      Knee/Hip Exercises: Seated   Sit to Sand 5 reps;with UE support   UEs on thighs     Knee/Hip Exercises: Supine   Bridges Limitations partial bridges x5      Knee/Hip Exercises: Sidelying   Clams x10                  PT Education - 06/22/20 1005    Education Details Pt educated on POC and HEP    Person(s) Educated Patient    Methods Explanation;Demonstration;Handout    Comprehension Verbalized understanding;Returned demonstration            PT Short Term Goals - 06/22/20 1007      PT SHORT TERM GOAL #1   Title Pt will be I with initial HEP    Time 2    Period Weeks    Status New    Target Date 07/06/20             PT Long Term Goals - 06/22/20 1007      PT LONG TERM GOAL #1   Title Pt will be I with advanced HEP    Time 8    Period Weeks     Status New    Target Date 08/17/20      PT LONG TERM GOAL #2   Title Pt will report 50% reduction in L hip pain    Time 8    Period Weeks    Status New    Target Date 08/17/20      PT LONG TERM GOAL #3   Title Pt will demo 3MWT with LRAD and no rest breaks (monitor vitals)    Time 8    Period Weeks    Status New    Target Date 08/17/20      PT LONG TERM GOAL #4   Title Pt will demo 5x STS with no UE support    Time 8  Period Weeks    Status New    Target Date 08/17/20                  Plan - 06/22/20 1010    Clinical Impression Statement Pt presents to clinic with reports of L hip pain after recent fall a few weeks ago onto L side. Pt had xrays which were neg for acute bony abnormalities. Pt d/c from hospital 06/03/20. Patient PMH of compensated cirrhosis of liver, chronic anemia and GI bleed, CHF, DM II, CKD, chronic Afib. Presented to ED with B LE weakness with multiple falls and Hgb of 5.0. Has very limited endurance; reporting significant SOB and fatigue after ambulating from car into clinic. Unable to perform 5x STS secondary to L hip pain and endurance deficits. Demos tenderness to palpation L ITB/glute med/greater troch, strength deficits LLE, LE flexibility deficits, and endurance deficits. Pt would benefit from functional endurance ex's and LE strengthening to address the above impairments.    Personal Factors and Comorbidities Comorbidity 3+    Comorbidities hx of acute GI bleed (was on eliquis), CHF, atrial flutter, DM, htn, pulmonary htn, afib    Examination-Activity Limitations Locomotion Level;Stairs;Stand    Examination-Participation Restrictions Community Activity;Interpersonal Relationship    Stability/Clinical Decision Making Evolving/Moderate complexity    Clinical Decision Making Low    Rehab Potential Good    PT Frequency 1x / week    PT Duration 8 weeks    PT Treatment/Interventions ADLs/Self Care Home Management;Cryotherapy;Moist  Heat;Traction;Ultrasound;Functional mobility training;Therapeutic activities;Therapeutic exercise;Manual techniques;Patient/family education;Neuromuscular re-education;Passive range of motion;Taping    PT Next Visit Plan functional strengthening, endurance ex's, manual/modalities to address L hip pain    PT Home Exercise Plan see pt instructions    Consulted and Agree with Plan of Care Patient           Patient will benefit from skilled therapeutic intervention in order to improve the following deficits and impairments:  Postural dysfunction,Decreased range of motion,Pain,Decreased strength,Decreased activity tolerance,Decreased mobility,Difficulty walking,Decreased endurance,Decreased balance,Hypomobility  Visit Diagnosis: Pain in left hip  Difficulty in walking, not elsewhere classified  Decreased functional mobility and endurance     Problem List Patient Active Problem List   Diagnosis Date Noted  . Angiodysplasia of small intestine ([place]) 06/10/2020  . Acute on chronic diastolic CHF (congestive heart failure) ([place])   . COVID-19 virus infection 05/27/2020  . Anemia due to chronic blood loss 04/15/2020  . Overweight (BMI 25.0-29.9) 03/02/2020  . Long term current use of anticoagulant   . Persistent atrial fibrillation ([place]) 02/06/2020  . Secondary hypercoagulable state ([place]) 02/06/2020  . CHF (congestive heart failure) ([place]) 07/23/2019  . 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 (HCV) ([place])   . Gout   . Anemia in chronic kidney disease 01/18/2018  . Pulmonary hypertension ([place]) on echocardiogram 01/14/2018  . AVM (arteriovenous malformation) of small bowel, acquired 01/14/2018  . Atrial fibrillation, chronic 10/25/2017  . Aortic atherosclerosis ([place]) 08/30/2017  . Diverticulitis of left colon status post robotic low anterior to sigmoid resection 05/22/2018 07/31/2017  . Hypertension associated with diabetes ([place]) 06/07/2017  . MDD (major depressive disorder),  recurrent, in partial remission ([place]) 06/07/2017  . Asthma 05/22/2017  . Chronic obstructive pulmonary disease ([place])   . Atypical atrial flutter ([place]) 09/28/2015  . Type 2 diabetes, controlled, with renal manifestation ([place]) 11/24/2013  . CKD (chronic kidney disease) stage 4, GFR 15-29 ml/min  11/24/2013  . Nonischemic cardiomyopathy ([place]) 11/22/2010  . Obesity 05/27/2009  . OSTEOPENIA 09/30/2008  .  FIBROIDS, UTERUS 03/05/2008  . OBSTRUCTIVE SLEEP APNEA 11/14/2007  . Chronic diastolic CHF (congestive heart failure) ([place]) 04/24/2007  . Hypothyroidism 01/28/2007  . Biventricular ICD (implantable cardioverter-defibrillator) in place 01/08/2007   Amador Cunas, PT, DPT [facility] Raylynne Cubbage 06/22/2020, 12:11 PM  [address] [place], [place], [address] Phone: [phone]   Fax:  [phone]  Name: Morgan Clay MRN: [id number] Date of Birth: 1946/03/22

## 2020-06-22 NOTE — Telephone Encounter (Addendum)
At Home-[facility]  Telephone Script Day: 5 Phone Call    Date of Phone Call: February 15th, 2022                                    Time: 10:55                                                    Subject ID: [id number]        Phone Call Performed:  [x]  Yes  []  No  If No,   [x]  Subject did not respond   []  Subject unavailable   []  Other, specify ___________      Weight and Blood pressure:  1. Did you use the provided scale to weigh yourself this morning? []  Yes  []  No   If Yes, Did you document it in the diary? []  Yes []  No  What was your weight today? _____ lbs.  If No, please ask to the subject to weigh himself/herself now and obtain the measurement. _____ lbs.     2. Did you use the provided "cuff" to take your Blood Pressure and Heart Rate this morning? []  Yes  []  No  If yes, did you document it in the Diary?  []  Yes  []  No    What was your Blood Pressure and Heart Rate today?   Heart Rate: _______           Blood Pressure: ________         Patient Global Assessment Visual Analog Scale (VAS): Please ask if subject completed the Visual Analog Scale in the Diary.  []  Yes []  No If No, Ask to complete now and remind the subject to complete it before next scheduled call or visit.       5-Point Likert Scale: Current Dyspnea Status How much difficulty are you having in breathing now?  []  not short of breath               []  mildly short of breath             []  moderately short of breath             []  severely short of breath                  []  very severely short of breath     7-Point Likert Scale: Patient-Assessed Dyspnea Status Compared to how much difficulty you were having with your breathing just before starting in the study, how is your breathing now?  []  Markedly better       []  Moderately better       []  Minimally better       []  No change       []  Minimally worse       []  Moderately worse       []  Markedly worse       Medications/Changes  and Medical Events:  1. How much oral diuretic did you take yesterday? ___________   2. Did you document in the Diary start time and end time for Furoscix Infusor (if applicable)? []  Yes []  No []  N/A   3. Have you had any problems/issues with Infusor? []  Yes []  No []  N/A If yes, please describe: ______________________________________________  4. Since your last visit/call, have you started any new medications or have your medications changed? (review compare to last visit/call)  []  Yes []  No If yes, please specify:________________________________________________   5. Since your last visit/call, have you had any medical events, have you visited a hospital, Emergency Department or seen any other health care provider?  []  Yes []  No If yes, please describe (including treatments):_____________________________       Since unable to get in touch with patient - protocol deviation completed.

## 2020-06-23 ENCOUNTER — Encounter (HOSPITAL_COMMUNITY)
Admission: RE | Disposition: A | Discharge status: Home / Self Care | Payer: Self-pay | Source: Home / Self Care | Attending: Cardiology

## 2020-06-23 ENCOUNTER — Other Ambulatory Visit (HOSPITAL_COMMUNITY): Payer: Medicare Other

## 2020-06-23 ENCOUNTER — Encounter (HOSPITAL_COMMUNITY): Payer: Self-pay | Admitting: Cardiology

## 2020-06-23 ENCOUNTER — Ambulatory Visit (HOSPITAL_COMMUNITY): Admission: RE | Admit: 2020-06-23 | Payer: Medicare Other | Source: Ambulatory Visit

## 2020-06-23 ENCOUNTER — Inpatient Hospital Stay (HOSPITAL_COMMUNITY)
Admission: RE | Admit: 2020-06-23 | Discharge: 2020-07-12 | DRG: 286 | Disposition: A | Discharge status: Home / Self Care | Payer: Medicare Other | Attending: Cardiology | Admitting: Cardiology

## 2020-06-23 ENCOUNTER — Encounter: Payer: Self-pay | Admitting: *Deleted

## 2020-06-23 ENCOUNTER — Other Ambulatory Visit: Payer: Self-pay

## 2020-06-23 ENCOUNTER — Encounter (HOSPITAL_COMMUNITY): Payer: Medicare Other | Admitting: Cardiology

## 2020-06-23 ENCOUNTER — Inpatient Hospital Stay (HOSPITAL_COMMUNITY): Payer: Medicare Other

## 2020-06-23 DIAGNOSIS — I5043 Acute on chronic combined systolic (congestive) and diastolic (congestive) heart failure: Secondary | ICD-10-CM | POA: Diagnosis present

## 2020-06-23 DIAGNOSIS — Z8701 Personal history of pneumonia (recurrent): Secondary | ICD-10-CM

## 2020-06-23 DIAGNOSIS — D631 Anemia in chronic kidney disease: Secondary | ICD-10-CM | POA: Diagnosis present

## 2020-06-23 DIAGNOSIS — N179 Acute kidney failure, unspecified: Secondary | ICD-10-CM | POA: Diagnosis present

## 2020-06-23 DIAGNOSIS — I480 Paroxysmal atrial fibrillation: Secondary | ICD-10-CM | POA: Diagnosis present

## 2020-06-23 DIAGNOSIS — M1990 Unspecified osteoarthritis, unspecified site: Secondary | ICD-10-CM | POA: Diagnosis present

## 2020-06-23 DIAGNOSIS — Z8616 Personal history of COVID-19: Secondary | ICD-10-CM

## 2020-06-23 DIAGNOSIS — M109 Gout, unspecified: Secondary | ICD-10-CM | POA: Diagnosis present

## 2020-06-23 DIAGNOSIS — Z20822 Contact with and (suspected) exposure to covid-19: Secondary | ICD-10-CM | POA: Diagnosis present

## 2020-06-23 DIAGNOSIS — E871 Hypo-osmolality and hyponatremia: Secondary | ICD-10-CM | POA: Diagnosis present

## 2020-06-23 DIAGNOSIS — I2510 Atherosclerotic heart disease of native coronary artery without angina pectoris: Secondary | ICD-10-CM | POA: Diagnosis not present

## 2020-06-23 DIAGNOSIS — Z825 Family history of asthma and other chronic lower respiratory diseases: Secondary | ICD-10-CM

## 2020-06-23 DIAGNOSIS — E785 Hyperlipidemia, unspecified: Secondary | ICD-10-CM | POA: Diagnosis present

## 2020-06-23 DIAGNOSIS — Z9049 Acquired absence of other specified parts of digestive tract: Secondary | ICD-10-CM

## 2020-06-23 DIAGNOSIS — N184 Chronic kidney disease, stage 4 (severe): Secondary | ICD-10-CM | POA: Diagnosis not present

## 2020-06-23 DIAGNOSIS — I447 Left bundle-branch block, unspecified: Secondary | ICD-10-CM | POA: Diagnosis present

## 2020-06-23 DIAGNOSIS — Z515 Encounter for palliative care: Secondary | ICD-10-CM | POA: Diagnosis not present

## 2020-06-23 DIAGNOSIS — M8580 Other specified disorders of bone density and structure, unspecified site: Secondary | ICD-10-CM | POA: Diagnosis present

## 2020-06-23 DIAGNOSIS — E1122 Type 2 diabetes mellitus with diabetic chronic kidney disease: Secondary | ICD-10-CM | POA: Diagnosis present

## 2020-06-23 DIAGNOSIS — I5082 Biventricular heart failure: Secondary | ICD-10-CM | POA: Diagnosis present

## 2020-06-23 DIAGNOSIS — I5032 Chronic diastolic (congestive) heart failure: Secondary | ICD-10-CM | POA: Diagnosis not present

## 2020-06-23 DIAGNOSIS — I428 Other cardiomyopathies: Secondary | ICD-10-CM | POA: Diagnosis present

## 2020-06-23 DIAGNOSIS — Z8619 Personal history of other infectious and parasitic diseases: Secondary | ICD-10-CM | POA: Diagnosis not present

## 2020-06-23 DIAGNOSIS — Z79890 Hormone replacement therapy (postmenopausal): Secondary | ICD-10-CM

## 2020-06-23 DIAGNOSIS — I5033 Acute on chronic diastolic (congestive) heart failure: Secondary | ICD-10-CM

## 2020-06-23 DIAGNOSIS — F32A Depression, unspecified: Secondary | ICD-10-CM | POA: Diagnosis present

## 2020-06-23 DIAGNOSIS — E859 Amyloidosis, unspecified: Secondary | ICD-10-CM | POA: Diagnosis present

## 2020-06-23 DIAGNOSIS — Z8711 Personal history of peptic ulcer disease: Secondary | ICD-10-CM | POA: Diagnosis not present

## 2020-06-23 DIAGNOSIS — J45909 Unspecified asthma, uncomplicated: Secondary | ICD-10-CM | POA: Diagnosis present

## 2020-06-23 DIAGNOSIS — N186 End stage renal disease: Secondary | ICD-10-CM | POA: Diagnosis not present

## 2020-06-23 DIAGNOSIS — R0600 Dyspnea, unspecified: Secondary | ICD-10-CM

## 2020-06-23 DIAGNOSIS — Z79899 Other long term (current) drug therapy: Secondary | ICD-10-CM

## 2020-06-23 DIAGNOSIS — Z8249 Family history of ischemic heart disease and other diseases of the circulatory system: Secondary | ICD-10-CM

## 2020-06-23 DIAGNOSIS — E440 Moderate protein-calorie malnutrition: Secondary | ICD-10-CM | POA: Insufficient documentation

## 2020-06-23 DIAGNOSIS — I132 Hypertensive heart and chronic kidney disease with heart failure and with stage 5 chronic kidney disease, or end stage renal disease: Secondary | ICD-10-CM | POA: Diagnosis not present

## 2020-06-23 DIAGNOSIS — Z7189 Other specified counseling: Secondary | ICD-10-CM | POA: Diagnosis not present

## 2020-06-23 DIAGNOSIS — I4892 Unspecified atrial flutter: Secondary | ICD-10-CM | POA: Diagnosis not present

## 2020-06-23 DIAGNOSIS — D62 Acute posthemorrhagic anemia: Secondary | ICD-10-CM | POA: Diagnosis present

## 2020-06-23 DIAGNOSIS — I509 Heart failure, unspecified: Secondary | ICD-10-CM

## 2020-06-23 DIAGNOSIS — G4733 Obstructive sleep apnea (adult) (pediatric): Secondary | ICD-10-CM | POA: Diagnosis present

## 2020-06-23 DIAGNOSIS — K7460 Unspecified cirrhosis of liver: Secondary | ICD-10-CM

## 2020-06-23 DIAGNOSIS — I5042 Chronic combined systolic (congestive) and diastolic (congestive) heart failure: Secondary | ICD-10-CM

## 2020-06-23 DIAGNOSIS — Z992 Dependence on renal dialysis: Secondary | ICD-10-CM

## 2020-06-23 DIAGNOSIS — Z95810 Presence of automatic (implantable) cardiac defibrillator: Secondary | ICD-10-CM | POA: Diagnosis not present

## 2020-06-23 DIAGNOSIS — I071 Rheumatic tricuspid insufficiency: Secondary | ICD-10-CM | POA: Diagnosis present

## 2020-06-23 DIAGNOSIS — I50810 Right heart failure, unspecified: Secondary | ICD-10-CM | POA: Diagnosis not present

## 2020-06-23 DIAGNOSIS — E039 Hypothyroidism, unspecified: Secondary | ICD-10-CM | POA: Diagnosis present

## 2020-06-23 DIAGNOSIS — R0609 Other forms of dyspnea: Secondary | ICD-10-CM

## 2020-06-23 DIAGNOSIS — H409 Unspecified glaucoma: Secondary | ICD-10-CM | POA: Diagnosis present

## 2020-06-23 HISTORY — PX: RIGHT HEART CATH: CATH118263

## 2020-06-23 LAB — GLUCOSE, CAPILLARY
Glucose-Capillary: 99 mg/dL (ref 70–99)
Glucose-Capillary: 111 mg/dL — ABNORMAL HIGH (ref 70–99)
Glucose-Capillary: 110 mg/dL — ABNORMAL HIGH (ref 70–99)
Glucose-Capillary: 167 mg/dL — ABNORMAL HIGH (ref 70–99)
Glucose-Capillary: 91 mg/dL (ref 70–99)

## 2020-06-23 LAB — CBC
HCT: 31 % — ABNORMAL LOW (ref 36.0–46.0)
Hemoglobin: 9.9 g/dL — ABNORMAL LOW (ref 12.0–15.0)
MCH: 28.5 pg (ref 26.0–34.0)
MCHC: 31.9 g/dL (ref 30.0–36.0)
MCV: 89.3 fL (ref 80.0–100.0)
Platelets: 215 10*3/uL (ref 150–400)
RBC: 3.47 MIL/uL — ABNORMAL LOW (ref 3.87–5.11)
RDW: 18.6 % — ABNORMAL HIGH (ref 11.5–15.5)
WBC: 5.6 10*3/uL (ref 4.0–10.5)
nRBC: 0 % (ref 0.0–0.2)

## 2020-06-23 LAB — IRON AND TIBC
Iron: 23 ug/dL — ABNORMAL LOW (ref 28–170)
Saturation Ratios: 5 % — ABNORMAL LOW (ref 10.4–31.8)
TIBC: 487 ug/dL — ABNORMAL HIGH (ref 250–450)
UIBC: 464 ug/dL

## 2020-06-23 LAB — DIFFERENTIAL
Abs Immature Granulocytes: 0.02 10*3/uL (ref 0.00–0.07)
Basophils Absolute: 0 10*3/uL (ref 0.0–0.1)
Basophils Relative: 1 %
Eosinophils Absolute: 0.1 10*3/uL (ref 0.0–0.5)
Eosinophils Relative: 1 %
Immature Granulocytes: 0 %
Lymphocytes Relative: 13 %
Lymphs Abs: 0.7 10*3/uL (ref 0.7–4.0)
Monocytes Absolute: 0.8 10*3/uL (ref 0.1–1.0)
Monocytes Relative: 13 %
Neutro Abs: 4 10*3/uL (ref 1.7–7.7)
Neutrophils Relative %: 72 %

## 2020-06-23 LAB — POCT I-STAT EG7
Acid-base deficit: 1 mmol/L (ref 0.0–2.0)
Acid-base deficit: 2 mmol/L (ref 0.0–2.0)
Bicarbonate: 24.6 mmol/L (ref 20.0–28.0)
Bicarbonate: 25.2 mmol/L (ref 20.0–28.0)
Calcium, Ion: 1.19 mmol/L (ref 1.15–1.40)
Calcium, Ion: 1.23 mmol/L (ref 1.15–1.40)
HCT: 28 % — ABNORMAL LOW (ref 36.0–46.0)
HCT: 29 % — ABNORMAL LOW (ref 36.0–46.0)
Hemoglobin: 9.5 g/dL — ABNORMAL LOW (ref 12.0–15.0)
Hemoglobin: 9.9 g/dL — ABNORMAL LOW (ref 12.0–15.0)
O2 Saturation: 64 %
O2 Saturation: 64 %
Potassium: 3.6 mmol/L (ref 3.5–5.1)
Potassium: 3.7 mmol/L (ref 3.5–5.1)
Sodium: 144 mmol/L (ref 135–145)
Sodium: 144 mmol/L (ref 135–145)
TCO2: 26 mmol/L (ref 22–32)
TCO2: 27 mmol/L (ref 22–32)
pCO2, Ven: 47.4 mmHg (ref 44.0–60.0)
pCO2, Ven: 48.5 mmHg (ref 44.0–60.0)
pH, Ven: 7.323 (ref 7.250–7.430)
pH, Ven: 7.324 (ref 7.250–7.430)
pO2, Ven: 36 mmHg (ref 32.0–45.0)
pO2, Ven: 36 mmHg (ref 32.0–45.0)

## 2020-06-23 LAB — FERRITIN: Ferritin: 30 ng/mL (ref 11–307)

## 2020-06-23 LAB — COMPREHENSIVE METABOLIC PANEL
ALT: 11 U/L (ref 0–44)
AST: 19 U/L (ref 15–41)
Albumin: 4.2 g/dL (ref 3.5–5.0)
Alkaline Phosphatase: 88 U/L (ref 38–126)
Anion gap: 13 (ref 5–15)
BUN: 38 mg/dL — ABNORMAL HIGH (ref 8–23)
CO2: 22 mmol/L (ref 22–32)
Calcium: 9.6 mg/dL (ref 8.9–10.3)
Chloride: 105 mmol/L (ref 98–111)
Creatinine, Ser: 2.92 mg/dL — ABNORMAL HIGH (ref 0.44–1.00)
GFR, Estimated: 17 mL/min — ABNORMAL LOW (ref >60)
Glucose, Bld: 109 mg/dL — ABNORMAL HIGH (ref 70–99)
Potassium: 4.1 mmol/L (ref 3.5–5.1)
Sodium: 140 mmol/L (ref 135–145)
Total Bilirubin: 0.5 mg/dL (ref 0.3–1.2)
Total Protein: 8.3 g/dL — ABNORMAL HIGH (ref 6.5–8.1)

## 2020-06-23 LAB — TSH
TSH: 3.392 u[IU]/mL (ref 0.350–4.500)
TSH: 3.588 u[IU]/mL (ref 0.350–4.500)

## 2020-06-23 LAB — BRAIN NATRIURETIC PEPTIDE: B Natriuretic Peptide: 1099.2 pg/mL — ABNORMAL HIGH (ref 0.0–100.0)

## 2020-06-23 SURGERY — RIGHT HEART CATH
Anesthesia: LOCAL

## 2020-06-23 MED ORDER — MIDAZOLAM HCL 2 MG/2ML IJ SOLN
INTRAMUSCULAR | Status: AC
  Filled 2020-06-23: qty 2

## 2020-06-23 MED ORDER — GABAPENTIN 300 MG PO CAPS
300.0000 mg | ORAL_CAPSULE | Freq: Two times a day (BID) | ORAL | Status: DC
  Administered 2020-06-23 – 2020-07-01 (×17): 300 mg via ORAL
  Filled 2020-06-23 (×17): qty 1

## 2020-06-23 MED ORDER — CARVEDILOL 12.5 MG PO TABS
12.5000 mg | ORAL_TABLET | Freq: Two times a day (BID) | ORAL | Status: DC
  Administered 2020-06-23 – 2020-06-29 (×13): 12.5 mg via ORAL
  Filled 2020-06-23 (×14): qty 1

## 2020-06-23 MED ORDER — PANTOPRAZOLE SODIUM 40 MG PO TBEC
40.0000 mg | DELAYED_RELEASE_TABLET | Freq: Every day | ORAL | Status: DC
Start: 2020-06-23 — End: 2020-07-12
  Administered 2020-06-23 – 2020-07-12 (×20): 40 mg via ORAL
  Filled 2020-06-23 (×21): qty 1

## 2020-06-23 MED ORDER — TRAMADOL HCL 50 MG PO TABS
50.0000 mg | ORAL_TABLET | Freq: Two times a day (BID) | ORAL | Status: DC | PRN
  Administered 2020-06-23 – 2020-07-12 (×10): 50 mg via ORAL
  Filled 2020-06-23 (×10): qty 1

## 2020-06-23 MED ORDER — ONDANSETRON HCL 4 MG/2ML IJ SOLN: 4.0000 mg | Freq: Four times a day (QID) | INTRAMUSCULAR | Status: DC | PRN

## 2020-06-23 MED ORDER — SODIUM CHLORIDE 0.9 % IV SOLN: INTRAVENOUS | Status: DC

## 2020-06-23 MED ORDER — LIDOCAINE HCL (PF) 1 % IJ SOLN
INTRAMUSCULAR | Status: DC | PRN
  Administered 2020-06-23: 3 mL
  Administered 2020-06-23: 10 mL

## 2020-06-23 MED ORDER — SODIUM CHLORIDE 0.9% FLUSH
3.0000 mL | Freq: Two times a day (BID) | INTRAVENOUS | Status: DC
  Administered 2020-06-23 – 2020-07-12 (×32): 3 mL via INTRAVENOUS

## 2020-06-23 MED ORDER — ACETAMINOPHEN 325 MG PO TABS
650.0000 mg | ORAL_TABLET | ORAL | Status: DC | PRN
  Administered 2020-07-06 – 2020-07-07 (×2): 650 mg via ORAL
  Filled 2020-06-23 (×4): qty 2

## 2020-06-23 MED ORDER — FERROUS SULFATE 325 (65 FE) MG PO TABS
325.0000 mg | ORAL_TABLET | Freq: Every day | ORAL | Status: DC
  Filled 2020-06-23: qty 1

## 2020-06-23 MED ORDER — MIDAZOLAM HCL 2 MG/2ML IJ SOLN
INTRAMUSCULAR | Status: DC | PRN
  Administered 2020-06-23 (×2): 1 mg via INTRAVENOUS

## 2020-06-23 MED ORDER — SODIUM CHLORIDE 0.9% FLUSH: 3.0000 mL | INTRAVENOUS | Status: DC | PRN

## 2020-06-23 MED ORDER — SODIUM CHLORIDE 0.9 % IV SOLN: 250.0000 mL | INTRAVENOUS | Status: DC | PRN

## 2020-06-23 MED ORDER — SODIUM CHLORIDE 0.9 % IV SOLN
510.0000 mg | Freq: Once | INTRAVENOUS | Status: AC
  Administered 2020-06-23: 510 mg via INTRAVENOUS
  Filled 2020-06-23: qty 17

## 2020-06-23 MED ORDER — LEVOTHYROXINE SODIUM 25 MCG PO TABS
137.0000 ug | ORAL_TABLET | Freq: Every day | ORAL | Status: DC
  Administered 2020-06-23 – 2020-07-12 (×20): 137 ug via ORAL
  Filled 2020-06-23 (×20): qty 1

## 2020-06-23 MED ORDER — TECHNETIUM TO 99M ALBUMIN AGGREGATED
4.3000 | Freq: Once | INTRAVENOUS | Status: AC | PRN
  Administered 2020-06-23: 4.3 via INTRAVENOUS

## 2020-06-23 MED ORDER — ISOSORB DINITRATE-HYDRALAZINE 20-37.5 MG PO TABS
1.0000 | ORAL_TABLET | Freq: Three times a day (TID) | ORAL | Status: DC
  Administered 2020-06-23 – 2020-06-27 (×12): 1 via ORAL
  Filled 2020-06-23 (×12): qty 1

## 2020-06-23 MED ORDER — HEPARIN (PORCINE) IN NACL 1000-0.9 UT/500ML-% IV SOLN
INTRAVENOUS | Status: AC
  Filled 2020-06-23: qty 500

## 2020-06-23 MED ORDER — FENTANYL CITRATE (PF) 100 MCG/2ML IJ SOLN
INTRAMUSCULAR | Status: DC | PRN
  Administered 2020-06-23 (×2): 25 ug via INTRAVENOUS

## 2020-06-23 MED ORDER — TRAZODONE HCL 50 MG PO TABS
50.0000 mg | ORAL_TABLET | Freq: Every day | ORAL | Status: DC
Start: 2020-06-23 — End: 2020-06-30
  Administered 2020-06-23 – 2020-06-29 (×7): 50 mg via ORAL
  Filled 2020-06-23 (×8): qty 1

## 2020-06-23 MED ORDER — LABETALOL HCL 5 MG/ML IV SOLN: 10.0000 mg | INTRAVENOUS | Status: AC | PRN

## 2020-06-23 MED ORDER — FENTANYL CITRATE (PF) 100 MCG/2ML IJ SOLN
INTRAMUSCULAR | Status: AC
  Filled 2020-06-23: qty 2

## 2020-06-23 MED ORDER — ALLOPURINOL 300 MG PO TABS
300.0000 mg | ORAL_TABLET | Freq: Every day | ORAL | Status: DC
  Administered 2020-06-23 – 2020-06-25 (×3): 300 mg via ORAL
  Filled 2020-06-23 (×3): qty 1

## 2020-06-23 MED ORDER — HYDRALAZINE HCL 20 MG/ML IJ SOLN
INTRAMUSCULAR | Status: AC
  Filled 2020-06-23: qty 1

## 2020-06-23 MED ORDER — HEPARIN SODIUM (PORCINE) 5000 UNIT/ML IJ SOLN
5000.0000 [IU] | Freq: Three times a day (TID) | INTRAMUSCULAR | Status: DC
  Administered 2020-06-23 – 2020-07-04 (×33): 5000 [IU] via SUBCUTANEOUS
  Filled 2020-06-23 (×34): qty 1

## 2020-06-23 MED ORDER — FUROSEMIDE 10 MG/ML IJ SOLN
80.0000 mg | Freq: Two times a day (BID) | INTRAMUSCULAR | Status: DC
  Administered 2020-06-23 – 2020-06-24 (×3): 80 mg via INTRAVENOUS
  Filled 2020-06-23 (×2): qty 8

## 2020-06-23 MED ORDER — SODIUM CHLORIDE 0.9% FLUSH: 3.0000 mL | Freq: Two times a day (BID) | INTRAVENOUS | Status: DC

## 2020-06-23 MED ORDER — HEPARIN (PORCINE) IN NACL 1000-0.9 UT/500ML-% IV SOLN
INTRAVENOUS | Status: DC | PRN
  Administered 2020-06-23: 500 mL

## 2020-06-23 MED ORDER — LIDOCAINE HCL (PF) 1 % IJ SOLN
INTRAMUSCULAR | Status: AC
  Filled 2020-06-23: qty 30

## 2020-06-23 MED ORDER — AMIODARONE HCL 100 MG PO TABS
100.0000 mg | ORAL_TABLET | Freq: Every day | ORAL | Status: DC
  Administered 2020-06-23 – 2020-07-12 (×20): 100 mg via ORAL
  Filled 2020-06-23 (×21): qty 1

## 2020-06-23 MED ORDER — FUROSEMIDE 10 MG/ML IJ SOLN
INTRAMUSCULAR | Status: AC
  Filled 2020-06-23: qty 8

## 2020-06-23 MED ORDER — HYDRALAZINE HCL 20 MG/ML IJ SOLN
10.0000 mg | INTRAMUSCULAR | Status: AC | PRN
  Administered 2020-06-23: 10 mg via INTRAVENOUS

## 2020-06-23 MED ORDER — ASPIRIN 81 MG PO CHEW
81.0000 mg | CHEWABLE_TABLET | ORAL | Status: AC
  Administered 2020-06-23: 81 mg via ORAL
  Filled 2020-06-23: qty 1

## 2020-06-23 SURGICAL SUPPLY — 8 items
CATH BALLN WEDGE 5F 110CM (CATHETERS) IMPLANT
CATH SWAN GANZ 7F STRAIGHT (CATHETERS) ×2 IMPLANT
KIT HEART LEFT (KITS) ×2 IMPLANT
PACK CARDIAC CATHETERIZATION (CUSTOM PROCEDURE TRAY) ×2 IMPLANT
SHEATH GLIDE SLENDER 4/5FR (SHEATH) ×2 IMPLANT
SHEATH PINNACLE 7F 10CM (SHEATH) ×2 IMPLANT
SHEATH PROBE COVER 6X72 (BAG) ×2 IMPLANT
TRANSDUCER W/STOPCOCK (MISCELLANEOUS) ×2 IMPLANT

## 2020-06-23 NOTE — H&P (Signed)
Advanced Heart Failure Team History and Physical Note   PCP:  Billie Ruddy, MD  PCP-Cardiology: No primary care provider on file.     Reason for Admission: CHF and renal failure   HPI:    75 y.o. with history of nonischemic cardiomyopathy with recovery of EF with BiV pacing, recurrent GI bleeding, and paroxysmal atrial flutter was initially referred by Dr. Caryl Comes for evaluation of CHF.  Patient has a history of presumed nonischemic cardiomyopathy for a number of years.  Echo in 12/15 showed EF 25-30% and Cardiolite at that time showed no ischemia.  She had Medtronic CRT-D system placed, and EF recovered. In 4/19, echo showed EF 60-65%.    She has additionally had problems with GI bleeding.  She had been on Eliquis for atrial flutter but this was stopped with recurrent GI bleeding.  She had bleeding from a gastric ulcer but was also noted to have multiple small bowel AVMs on capsule endoscopy out of reach of endoscope.  Eliquis was stopped. She has had no melena or BRBPR off Eliquis.   Echo in 10/19 showed EF 55-60%, mild-moderate MR, moderate AI, moderate RV dilation.   She was admitted in 1/20 with diverticulitis and ended up having a sigmoid colectomy.    Echo in 9/20 showed EF lower at 45-50%, moderate AI.   She had LHC/RHC in 8/21 with nonobstructive CAD, normal filling pressures, and preserved cardiac output. Echo 8/21 showed EF 50-55%, mild LVH, mildly decreased RV systolic function, moderate aortic insufficiency.  Back in in A fib 10/21. Amiodarone was increased. Chemically converted so DC-CV canceled.  Patient underwent small bowel enteroscopy 03/02/2020 with findings trace esophageal varix, small superficial AVMs prepyloric stomach with other punctate vascular lesions treated with APC, benign duodenal polyp. Etiology likely secondary to jejunal AVM versus early GAVE vsportal gastropathy.Patient underwent small bowel enteroscopy 03/02/2020 with findings trace esophageal  varix, small superficial AVMs prepyloric stomach with other punctate vascular lesions treated with APC, benign duodenal polyp. Etiology likely secondary to jejunal AVM versus early GAVE vsportal gastropathy. Eliquis restarted.   Hgb dropped back down to 7.7 in 12/21. Saw [name] and capsule placed but got stuck in the stomach.   She was admitted on 05/27/20 with dizziness, and a fall preceded by melena found to have further GI bleeding hgb of 5.1 and an AKI. Eliquis was held and she was transfused 2u PRBC's. Repeat pill endoscope showed bleeding in distal small bowel. Colonoscopy found no bleeding but large avm in cecum argon lasered. She has been off Eliquis since this time.   She has had no further over GI bleeding, but has had steady worsening of exertional dyspnea to the point of NYHA class IIIb symptoms.  We have tried increasing diuretics as an outpatient but she has developed worsening renal failure, recently creatinine up to as high as 3.25.  With intractable volume overload and worsening renal function, she had RHC done today, see below.   RHC Procedural Findings (today): Hemodynamics (mmHg) RA mean 21 RV 49/20 PA 59/24, mean 39 PCWP mean 26 Oxygen saturations: PA 64% AO 98% Cardiac Output (Fick) 5.07  Cardiac Index (Fick) 3.23 PVR 2.6 WU Cardiac Output (Thermo) 4.72  Cardiac Index (Thermo) 3.01  PVR 2.75 WU PAPI 1.7  With RV > LV failure, NYHA class IIIb symptoms, and renal dysfunction, decision made to admit patient today.   Review of Systems: All systems reviewed and negative except as per HPI.    Home Medications Prior to  Admission medications   Medication Sig Start Date End Date Taking? Authorizing Provider  allopurinol (ZYLOPRIM) 300 MG tablet Take 300 mg by mouth daily.    Yes [provider]  amiodarone (PACERONE) 100 MG tablet Take 1 tablet (100 mg total) by mouth daily. 06/04/20 07/04/20 Yes Pahwani, Einar Grad, MD  carvedilol (COREG) 12.5 MG tablet  Take 1 tablet (12.5 mg total) by mouth 2 (two) times daily. 06/03/20 07/03/20 Yes Darliss Cheney, MD  colchicine 0.6 MG tablet Take 0.6 mg by mouth daily as needed (gout). 02/06/18  Yes [provider]  cromolyn (OPTICROM) 4 % ophthalmic solution Place 1 drop into both eyes 2 (two) times daily. 06/14/20  Yes [provider]  ferrous sulfate 325 (65 FE) MG tablet Take 1 tablet (325 mg total) by mouth daily. 03/04/20 03/04/21 Yes [place] Eric J, DO  gabapentin (NEURONTIN) 300 MG capsule Take 1 capsule (300 mg total) by mouth 2 (two) times daily. 07/17/19  Yes Billie Ruddy, MD  isosorbide-hydrALAZINE (BIDIL) 20-37.5 MG tablet Take 1 tablet by mouth 3 (three) times daily. 12/05/19  Yes [name], MD  levothyroxine (SYNTHROID) 137 MCG tablet Take 1 tablet (137 mcg total) by mouth daily before breakfast. 03/11/20  Yes Billie Ruddy, MD  nitroGLYCERIN (NITROSTAT) 0.4 MG SL tablet Place 0.4 mg under the tongue every 5 (five) minutes x 3 doses as needed for chest pain.    Yes [provider]  pantoprazole (PROTONIX) 40 MG tablet Take 1 tablet (40 mg total) by mouth daily. 04/06/20 04/06/21 Yes Clegg, [name] D, NP  potassium chloride SA (KLOR-CON) 20 MEQ tablet Take 1 tablet (20 mEq total) by mouth daily. 06/18/20  Yes [name], MD  spironolactone (ALDACTONE) 25 MG tablet Take 0.5 tablets (12.5 mg total) by mouth daily. 12/05/19 03/23/22 Yes [name], MD  tiZANidine (ZANAFLEX) 2 MG tablet Take 1 tablet (2 mg total) by mouth at bedtime as needed for muscle spasms. 04/07/20  Yes Billie Ruddy, MD  torsemide (DEMADEX) 20 MG tablet Take 2 tablets (40 mg total) by mouth daily. Patient taking differently: Take 20 mg by mouth 2 (two) times daily. 06/21/20  Yes [place], Maricela Bo, FNP  traZODone (DESYREL) 50 MG tablet Take 50 mg by mouth at bedtime.   Yes [provider]    Past Medical History: 1. Diabetes: diet-controlled.  2. Gout 3. HCV: Treated with  Harvoni.  4. Atrial flutter: Atypical.  Had DCCV in 3/19, maintained on amiodarone.  5. HTN 6. CKD stage 3: Sees nephrology, [name].  7. H/o SBO 8. Obesity 9. Nonischemic cardiomyopathy: Echo in 12/15 with EF 25-30%.  Cardiolite in 12/15 with no ischemia.  She had a Medtronic CRT-D device placed with recovery of EF.   - Echo (4/19): EF 60-65% with moderate AI, moderate MR, and PASP 49 mmHg.  - RHC 02/13/2018 RA mean 6, RV 49/7,PA 50/18, mean 31,PCWP mean 20, Oxygen saturations:PA 67% AO 97%, Cardiac Output (Fick) 4.6, Cardiac Index (Fick) 2.9, PVR 2.4 WU - Echo (10/19): EF 55-60%, mild-moderate MR, moderate AI, moderate RV dilation, PASP 60 mmHg.  - PYP scan (3/20): H/CL 1.3, grade 1-2 visually.  Unlikely transthyretin cardiac amyloidosis.  - Echo (9/20): EF 45-50%, moderate aortic insufficiency.  - LHC/RHC (8/21): 40% pLAD, 50% D1; mean RA 3, PA 41/16, mean PCWP 14, CI 2.74, PVR 2.99.  - Echo (8/21): EF 50-55%, mild LVH, grade 2 diastolic dysfunction, normal RV size with mildly decreased systolic function, moderate AI,  no AS.  10. GI bleeding: Gastric AVM and also gastric ulcer seen in past.  - 8/19 capsule endoscopy showed small bowel AVMs beyond the reach of endoscopy.  - With recurrent episodes of GI bleeding, Eliquis was stopped.  - Capsule study 1/22 showed bleeding in distal small bowel. Colonoscopy found no bleeding but large avm in cecum argon lasered.  11. Fe deficiency anemia.  12. Depression. 13. OSA: Uses CPAP.  14. Diverticulitis: Sigmoid colectomy in 1/20.   Past Surgical History: Past Surgical History:  Procedure Laterality Date  . APPENDECTOMY    . CARDIAC CATHETERIZATION    . CARDIOVERSION N/A 12/14/2014   Procedure: CARDIOVERSION;  Surgeon: Dorothy Spark, MD;  Location: [facility] ENDOSCOPY;  Service: Cardiovascular;  Laterality: N/A;  . CARDIOVERSION N/A 02/26/2017   Procedure: CARDIOVERSION;  Surgeon: Evans Lance, MD;  Location: [place] CV LAB;  Service:  Cardiovascular;  Laterality: N/A;  . CARDIOVERSION N/A 07/24/2017   Procedure: CARDIOVERSION;  Surgeon: Thayer Headings, MD;  Location: [facility] ENDOSCOPY;  Service: Cardiovascular;  Laterality: N/A;  . CARDIOVERSION N/A 02/12/2020   Procedure: CARDIOVERSION;  Surgeon: [name], MD;  Location: [facility] ENDOSCOPY;  Service: Cardiovascular;  Laterality: N/A;  . COLONOSCOPY N/A 11/08/2017   Procedure: COLONOSCOPY;  Surgeon: Milus Banister, MD;  Location: [facility] ENDOSCOPY;  Service: Endoscopy;  Laterality: N/A;  . COLONOSCOPY WITH PROPOFOL N/A 05/31/2020   Procedure: COLONOSCOPY WITH PROPOFOL;  Surgeon: Irene Shipper, MD;  Location: [facility] ENDOSCOPY;  Service: Endoscopy;  Laterality: N/A;  . DILATION AND CURETTAGE OF UTERUS    . ENTEROSCOPY N/A 03/02/2020   Procedure: ENTEROSCOPY;  Surgeon: Yetta Flock, MD;  Location: [facility] ENDOSCOPY;  Service: Gastroenterology;  Laterality: N/A;  . EP IMPLANTABLE DEVICE N/A 10/06/2015   Procedure: BIV ICD Generator Changeout;  Surgeon: [name], MD;  Location: [place] [facility] LAB;  Service: Cardiovascular;  Laterality: N/A;  . ESOPHAGOGASTRODUODENOSCOPY N/A 10/26/2017   Procedure: ESOPHAGOGASTRODUODENOSCOPY (EGD);  Surgeon: Ladene Artist, MD;  Location: [facility] ENDOSCOPY;  Service: Endoscopy;  Laterality: N/A;  . ESOPHAGOGASTRODUODENOSCOPY (EGD) WITH PROPOFOL N/A 11/07/2017   Procedure: ESOPHAGOGASTRODUODENOSCOPY (EGD) WITH PROPOFOL;  Surgeon: Milus Banister, MD;  Location: [facility] ENDOSCOPY;  Service: Endoscopy;  Laterality: N/A;  . ESOPHAGOGASTRODUODENOSCOPY (EGD) WITH PROPOFOL N/A 05/29/2020   Procedure: ESOPHAGOGASTRODUODENOSCOPY (EGD) WITH PROPOFOL;  Surgeon: Doran Stabler, MD;  Location: [place];  Service: Gastroenterology;  Laterality: N/A;  . GIVENS CAPSULE STUDY N/A 05/19/2020   Procedure: GIVENS CAPSULE STUDY;  Surgeon: [name], MD;  Location: [place];  Service: Endoscopy;  Laterality: N/A;  .adm for obs since pacemaker, PA wil enter order and  see pt  . GIVENS CAPSULE STUDY N/A 05/29/2020   Procedure: GIVENS CAPSULE STUDY;  Surgeon: Doran Stabler, MD;  Location: [place];  Service: Gastroenterology;  Laterality: N/A;  . HOT HEMOSTASIS N/A 10/26/2017   Procedure: HOT HEMOSTASIS (ARGON PLASMA COAGULATION/BICAP);  Surgeon: Ladene Artist, MD;  Location: [facility] ENDOSCOPY;  Service: Endoscopy;  Laterality: N/A;  . HOT HEMOSTASIS N/A 03/02/2020   Procedure: HOT HEMOSTASIS (ARGON PLASMA COAGULATION/BICAP);  Surgeon: Yetta Flock, MD;  Location: [facility] ENDOSCOPY;  Service: Gastroenterology;  Laterality: N/A;  . HOT HEMOSTASIS N/A 05/31/2020   Procedure: HOT HEMOSTASIS (ARGON PLASMA COAGULATION/BICAP);  Surgeon: Irene Shipper, MD;  Location: [facility] ENDOSCOPY;  Service: Endoscopy;  Laterality: N/A;  . INSERT / REPLACE / REMOVE PACEMAKER  ?2008  . POLYPECTOMY  11/08/2017   Procedure: POLYPECTOMY;  Surgeon:  Milus Banister, MD;  Location: [facility] ENDOSCOPY;  Service: Endoscopy;;  . PROCTOSCOPY N/A 05/22/2018   Procedure: RIGID PROCTOSCOPY;  Surgeon: Michael Boston, MD;  Location: [facility] ORS;  Service: General;  Laterality: N/A;  . RIGHT HEART CATH N/A 02/13/2018   Procedure: RIGHT HEART CATH;  Surgeon: [name], MD;  Location: [place] CV LAB;  Service: Cardiovascular;  Laterality: N/A;  . RIGHT/LEFT HEART CATH AND CORONARY ANGIOGRAPHY N/A 12/11/2019   Procedure: RIGHT/LEFT HEART CATH AND CORONARY ANGIOGRAPHY;  Surgeon: [name], MD;  Location: [place] [facility] LAB;  Service: Cardiovascular;  Laterality: N/A;  . SUBMUCOSAL TATTOO INJECTION  03/02/2020   Procedure: SUBMUCOSAL TATTOO INJECTION;  Surgeon: Yetta Flock, MD;  Location: [facility] ENDOSCOPY;  Service: Gastroenterology;;  . Lia Foyer TATTOO INJECTION  05/31/2020   Procedure: SUBMUCOSAL TATTOO INJECTION;  Surgeon: Irene Shipper, MD;  Location: [place];  Service: Endoscopy;;  . TUBAL LIGATION    . XI ROBOTIC ASSISTED LOWER ANTERIOR RESECTION N/A 05/22/2018   Procedure: XI  ROBOTIC ASSISTED SIGMOID COLOECTOMY MOBILIZATION OF SPLENIC FLEXURE, FIREFLY ASSESSMENT OF PERFUSION;  Surgeon: Michael Boston, MD;  Location: [facility] ORS;  Service: General;  Laterality: N/A;  ERAS PATHWAY    Family History: Family History  Problem Relation Age of Onset  . Asthma Father   . Heart attack Father   . Asthma Sister   . Lung cancer Sister   . Heart attack Mother   . Stroke Brother   . Colon cancer Neg Hx   . Esophageal cancer Neg Hx   . Pancreatic cancer Neg Hx   . Stomach cancer Neg Hx   . Liver disease Neg Hx     Social History: Social History   Socioeconomic History  . Marital status: Divorced    Spouse name: Not on file  . Number of children: 1  . Years of education: 5  . Highest education level: Not on file  Occupational History  . Not on file  Tobacco Use  . Smoking status: Never Smoker  . Smokeless tobacco: Never Used  Vaping Use  . Vaping Use: Never used  Substance and Sexual Activity  . Alcohol use: Yes    Comment: 01/03/2018 "couple drinks/month"  . Drug use: Yes    Types: Marijuana    Comment: VERY INTERMITTENT   . Sexual activity: Not Currently  Other Topics Concern  . Not on file  Social History Narrative   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      Home Situation: lives alone      Spiritual Beliefs: Christian - [employer]      Lifestyle: no regular exercise; poor diet      She is divorced at least one adult child   Never smoker    rare alcohol to occasional at best    no substance abuse         Social Determinants of Radio broadcast assistant Strain: Not on file  Food Insecurity: Not on file  Transportation Needs: Not on file  Physical Activity: Not on file  Stress: Not on file  Social Connections: Not on file    Allergies:  No Known Allergies  Objective:    Vital Signs:   Temp:  [97.6 F (36.4 C)] 97.6 F (36.4 C) (02/16 0631) Pulse Rate:  [59-67] 59 (02/16 0950) Resp:  [12-26] 17 (02/16 0950) BP:  (137-175)/(59-81) 175/59 (02/16 0950) SpO2:  [97 %-100 %] 100 % (02/16 0950) Weight:  [61.7 kg] 61.7 kg (  02/16 0631)   Filed Weights   06/23/20 0631  Weight: 61.7 kg     Physical Exam     General:  Well appearing. No respiratory difficulty HEENT: Normal Neck: Supple. JVP 14-16. Carotids 2+ bilat; no bruits. No lymphadenopathy or thyromegaly appreciated. Cor: PMI nondisplaced. Regular rate & rhythm. No rubs, gallops or murmurs. Lungs: Clear Abdomen: Soft, nontender, nondistended. No hepatosplenomegaly. No bruits or masses. Good bowel sounds. Extremities: No cyanosis, clubbing, rash. 1+ ankle edema.  Neuro: Alert & oriented x 3, cranial nerves grossly intact. moves all 4 extremities w/o difficulty. Affect pleasant.   Telemetry   NSR, BiV paced (personally reviewed)  EKG   Pending  Labs     Basic Metabolic Panel: Recent Labs  Lab 06/18/20 1304 06/18/20 1331 06/21/20 1501 06/23/20 0931  NA 141 141 137 144  144  K 4.2 4.1 4.2 3.7  3.6  CL 104 106 100  --   CO2  --  25 23  --   GLUCOSE 121* 125* 101*  --   BUN 33* 32* 38*  --   CREATININE 2.30* 2.54* 3.25*  --   CALCIUM  --  9.5 9.2  --   MG  --   --  2.4  --     Liver Function Tests: Recent Labs  Lab 06/18/20 1331  AST 18  ALT 12  ALKPHOS 82  BILITOT 1.0  PROT 7.7  ALBUMIN 3.9   No results for input(s): LIPASE, AMYLASE in the last 168 hours. No results for input(s): AMMONIA in the last 168 hours.  CBC: Recent Labs  Lab 06/18/20 1304 06/18/20 1331 06/23/20 0931  WBC  --  6.2  --   HGB 10.9* 8.9* 9.9*  9.5*  HCT 32.0* 28.1* 29.0*  28.0*  MCV  --  90.1  --   PLT  --  229  --     Cardiac Enzymes: No results for input(s): CKTOTAL, CKMB, CKMBINDEX, TROPONINI in the last 168 hours.  BNP: BNP (last 3 results) Recent Labs    02/04/20 1416 06/18/20 1300 06/21/20 1501  BNP 437* 1,147.8* 981.7*    ProBNP (last 3 results) Recent Labs    07/17/19 1352 07/23/19 1033  PROBNP 811.0* 637.0*      CBG: Recent Labs  Lab 06/23/20 0632 06/23/20 0957  GLUCAP 99 111*    Coagulation Studies: No results for input(s): LABPROT, INR in the last 72 hours.  Imaging: CARDIAC CATHETERIZATION  Result Date: 06/23/2020 1. Elevated filling pressures, RV failure out of proportion to left-sided failure. 2. PAPI 1.7 suggesting RV dysfunction. 3. Preserved cardiac output. 4. Moderate pulmonary venous hypertension. Will admit patient for close monitoring of creatinine with IV diuresis.      Assessment/Plan   1. Chronic primarily diastolic CHF with prominent RV dysfunction: Patient has history of presumed nonischemic cardiomyopathy, EF as low at 25-30% in 12/15.  Medtronic CRT-D device placed with improvement in LV function, echo in 4/19 showed EF improved to 60-65%.  PYP scan in 3/20 was equivocal for transthyretin amyloidosis, PYP scan in 1/22 was not suggestive of transthyretin amyloidosis. Echo in 9/20 showed EF back down to 45-50%. LHC/RHC in 8/21 showed nonobstructive CAD, normal filling pressures and cardiac output.  Echo 8/21 showed EF 50-55%, mildly decreased RV systolic function, moderate AI.  NYHA class IIIb symptoms currently, progressive volume overload and renal failure recently.  RHC today showed elevated R>L heart filling pressures with low PAPI at 1.7 and preserved cardiac output. Decision made to  admit for diuresis and monitoring of renal function.  - Start Lasix 80 mg IV bid and follow response.  - Continue Bidil 1 tab tid.  - Stop spironolactone with creatinine up to 3.25 recently.  - Continue Coreg 12.5 mg bid.  - I will arrange for repeat echo.    2. Atrial flutter/fibrillation: History of atypical flutter and atrial fibrillation. Lately has been in NSR on amiodarone 100 mg daily.  She is off apixaban with recurrent severe GI bleeding.  - Continue amiodarone.  Check LFTs and TSH today.  She will need a regular eye exam.  - Has been referred to Dr. Quentin Ore for evaluation for  Watchman device.  3. CKD stage 3b: Followed by nephrology, steady worsening of renal function with diuresis, most recently creatinine 3.25.  Will check BMET today.  - IV diuresis, hopefully renal function will improve with lowering of renal venous pressure with diuresis.  - I will ask nephrology to see as inpatient.  4. Anemia: Recurrent severe GI bleeding.  She is now off Eliquis.  - CBC today.  - Check Fe studies.  - As above, Watchman evaluation eventually.  5. Hypertension: Continue current meds.    Loralie Champagne 06/23/2020    Loralie Champagne, MD 06/23/2020, 9:59 AM  Advanced Heart Failure Team Pager [phone] (M-F; 7a - 4p)  Please contact [facility] for night-coverage after hours (4p -7a ) and weekends on [email or web address]

## 2020-06-23 NOTE — Consult Note (Signed)
Referring Provider: No ref. provider found Primary Care Physician:  Billie Ruddy, MD Primary Nephrologist:    Reason for Consultation: Acute kidney injury, chronic kidney disease stage IV, maintenance euvolemia, assessment treatment of anemia.  HPI: 75 year old lady with history of nonischemic cardiomyopathy, she underwent Medtr[employer] CRT-D biventricular pacer with improved ejection fraction last 2D echo showing ejection fraction of 60-65%.  She had a Cardiolite study done that showed no evidence of any ischemia.  She has had problems with GI bleeding and has been on Eliquis for atrial flutter.  She developed a gastric ulcer and noted to have small multiple lesions on AVMs on capsule endoscopy.  She had a right-sided heart catheterization 12/27/2019 with nonobstructive coronary artery disease.  She is undergoing right sided cardiac catheterization 06/23/2020 without the use of IV contrast she has a history of diabetes is diet controlled, history of gout, history of atrial flutter, history of hepatitis C treated with Harvoni, history of small bowel obstruction and history of GI bleed.  Chronic kidney disease is being followed by [name] since she last saw [name] ( who is since retired).  CT scan reveals no acute findings though there is a nodular liver contour s suggestive of cirrhosis 05/28/2020  Most recent admission 05/27/2020 with anemia and GI bleed hemoglobin 5.1 status post colonoscopy.  Status post 2 units packed red blood cells.  Discontinuation of Eliquis  Blood pressure 139/59 pulse 60 temperature 97.6 O2 sats 96% 2 L nasal cannula  Sodium 144 potassium 3.6 chloride 100 CO2 23 BUN 38 creatinine 3.25 glucose 101 calcium 9.2 hemoglobin 9.9  Home medications allopurinol 300 mg daily amiodarone 100 mg daily Coreg 12.5 mg twice daily calcium 0.6 mg daily gabapentin 300 mg twice daily levothyroxine 137 mcg daily Protonix 40 mg daily potassium 20 mEq daily spironolactone 25 mg  daily torsemide 40 mg daily trazodone 50 mg daily   Past Medical History:  Diagnosis Date  . A-fib ([place])    on Eliquis  . AICD (automatic cardioverter/defibrillator) present 10/06/2015  . Anemia   . Angiodysplasia of small intestine ([place]) 06/10/2020   Ileum - seen on capsule endoscopy 05/2020 - ablated at colonoscopy  . ARF (acute renal failure) ([place])   . Arthritis    "qwhere" (01/03/2018)  . Asthma    reports mild asthma since childhood - had COPD on dx list from prior PCP  . Chronic bronchitis ([place])    "get it most years; not this past year though" (01/03/2018)  . Chronic kidney disease    "? stage;  followed by [facility]; [name]" (01/03/2018)  . Chronic systolic congestive heart failure ([place])   . COVID-19 05/03/2020  . DEPRESSION 12/17/2009   Annotation: PHQ-9 score = 14 done on 12/17/2009 Qualifier: Diagnosis of  By: Hassell Done FNP, Tori Milks    . Diabetes mellitus without complication (HCC)    DIET CONTROLLED   . Diverticulosis   . Enteritis   . FIBROIDS, UTERUS 03/05/2008  . GI bleeding 01/03/2018  . Glaucoma   . Gout   . Hepatitis C    HEPATITIS C - s/p treatment with Harvoni, saw hepatology, Dawn Drazek  . History of blood transfusion ~ 11/2017  . Hyperlipemia 12/06/2012  . HYPERTENSION, BENIGN 04/24/2007  . Hypothyroidism   . LBBB (left bundle branch block)   . Lower GI bleeding    "been dealing w/it since 07/2017" (01/03/2018)  . Nonischemic cardiomyopathy ([place])   . OBESITY 05/27/2009  . OBSTRUCTIVE SLEEP APNEA 11/14/2007  no CPAP  . OSTEOPENIA 09/30/2008  . Pain and swelling of left upper extremity 08/08/2017  . Personal history of other infectious and parasitic disease    Hepatitis B  . Pneumonia    "several times" (01/03/2018)  . Pulmonary edema, acute ([place]) 08/09/2017    Past Surgical History:  Procedure Laterality Date  . APPENDECTOMY    . CARDIAC CATHETERIZATION    . CARDIOVERSION N/A 12/14/2014   Procedure: CARDIOVERSION;  Surgeon: Dorothy Spark, MD;   Location: [facility] ENDOSCOPY;  Service: Cardiovascular;  Laterality: N/A;  . CARDIOVERSION N/A 02/26/2017   Procedure: CARDIOVERSION;  Surgeon: Evans Lance, MD;  Location: [place] CV LAB;  Service: Cardiovascular;  Laterality: N/A;  . CARDIOVERSION N/A 07/24/2017   Procedure: CARDIOVERSION;  Surgeon: Thayer Headings, MD;  Location: [facility] ENDOSCOPY;  Service: Cardiovascular;  Laterality: N/A;  . CARDIOVERSION N/A 02/12/2020   Procedure: CARDIOVERSION;  Surgeon: [name], MD;  Location: [facility] ENDOSCOPY;  Service: Cardiovascular;  Laterality: N/A;  . COLONOSCOPY N/A 11/08/2017   Procedure: COLONOSCOPY;  Surgeon: Milus Banister, MD;  Location: [facility] ENDOSCOPY;  Service: Endoscopy;  Laterality: N/A;  . COLONOSCOPY WITH PROPOFOL N/A 05/31/2020   Procedure: COLONOSCOPY WITH PROPOFOL;  Surgeon: Irene Shipper, MD;  Location[facility] ENDOSCOPY;  Service: Endoscopy;  Laterality: N/A;  . DILATION AND CURETTAGE OF UTERUS    . ENTEROSCOPY N/A 03/02/2020   Procedure: ENTEROSCOPY;  Surgeon: Yetta Flock, MD;  Location: [facility] ENDOSCOPY;  Service: Gastroenterology;  Laterality: N/A;  . EP IMPLANTABLE DEVICE N/A 10/06/2015   Procedure: BIV ICD Generator Changeout;  Surgeon: [name], MD;  Location: [place] CV LAB;  Service: Cardiovascular;  Laterality: N/A;  . ESOPHAGOGASTRODUODENOSCOPY N/A 10/26/2017   Procedure: ESOPHAGOGASTRODUODENOSCOPY (EGD);  Surgeon: Ladene Artist, MD;  Location: [facility] ENDOS[facility];  Service: Endoscopy;  Laterality: N/A;  . ESOPHAGOGASTRODUODENOSCOPY (EGD) WITH PROPOFOL N/A 11/07/2017   Procedure: ESOPHAGOGASTRODUODENOSCOPY (EGD) WITH PROPOFOL;  Surgeon: Milus Banister, MD;  Location: [facility] ENDOSCOPY;  Service: Endoscopy;  Laterality: N/A;  . ESOPHAGOGASTRODUODENOSCOPY (EGD) WITH PROPOFOL N/A 05/29/2020   Procedure: ESOPHAGOGASTRODUODENOSCOPY (EGD) WITH PROPOFOL;  Surgeon: Doran Stabler, MD;  Location: [place];  Service: Gastroenterology;  Laterality: N/A;  . GIVENS CAPSULE STUDY  N/A 05/19/2020   Procedure: GIVENS CAPSULE STUDY;  Surgeon: [name], MD;  Location: [place];  Service: Endoscopy;  Laterality: N/A;  .adm for obs since pacemaker, PA wil enter order and see pt  . GIVENS CAPSULE STUDY N/A 05/29/2020   Procedure: GIVENS CAPSULE STUDY;  Surgeon: Doran Stabler, MD;  Location: [place];  Service: Gastroenterology;  Laterality: N/A;  . HOT HEMOSTASIS N/A 10/26/2017   Procedure: HOT HEMOSTASIS (ARGON PLASMA COAGULATION/BICAP);  Surgeon: Ladene Artist, MD;  Location: [facility] ENDOSCOPY;  Service: Endoscopy;  Laterality: N/A;  . HOT HEMOSTASIS N/A 03/02/2020   Procedure: HOT HEMOSTASIS (ARGON PLASMA COAGULATION/BICAP);  Surgeon: Yetta Flock, MD;  Location: [facility] ENDOSCOPY;  Service: Gastroenterology;  Laterality: N/A;  . HOT HEMOSTASIS N/A 05/31/2020   Procedure: HOT HEMOSTASIS (ARGON PLASMA COAGULATION/BICAP);  Surgeon: Irene Shipper, MD;  Location: [facility] ENDOSCOPY;  Service: Endoscopy;  Laterality: N/A;  . INSERT / REPLACE / REMOVE PACEMAKER  ?2008  . POLYPECTOMY  11/08/2017   Procedure: POLYPECTOMY;  Surgeon: Milus Banister, MD;  Location: [facility] ENDOSCOPY;  Service: Endoscopy;;  . PROCTOSCOPY N/A 05/22/2018   Procedure: RIGID PROCTOSCOPY;  Surgeon: Michael Boston, MD;  Location: [facility] ORS;  Service: General;  Laterality: N/A;  .  RIGHT HEART CATH N/A 02/13/2018   Procedure: RIGHT HEART CATH;  Surgeon: [name], MD;  Location: [place] CV LAB;  Service: Cardiovascular;  Laterality: N/A;  . RIGHT/LEFT HEART CATH AND CORONARY ANGIOGRAPHY N/A 12/11/2019   Procedure: RIGHT/LEFT HEART CATH AND CORONARY ANGIOGRAPHY;  Surgeon: [name], MD;  Location: [place] CV LAB;  Service: Cardiovascular;  Laterality: N/A;  . SUBMUCOSAL TATTOO INJECTION  03/02/2020   Procedure: SUBMUCOSAL TATTOO INJECTION;  Surgeon: Yetta Flock, MD;  Location: [facility] ENDOSCOPY;  Service: Gastroenterology;;  . Lia Foyer TATTOO INJECTION  05/31/2020   Procedure: SUBMUCOSAL  TATTOO INJECTION;  Surgeon: Irene Shipper, MD;  Location: [place];  Service: Endoscopy;;  . TUBAL LIGATION    . XI ROBOTIC ASSISTED LOWER ANTERIOR RESECTION N/A 05/22/2018   Procedure: XI ROBOTIC ASSISTED SIGMOID COLOECTOMY MOBILIZATION OF SPLENIC FLEXURE, FIREFLY ASSESSMENT OF PERFUSION;  Surgeon: Michael Boston, MD;  Location: [facility] ORS;  Service: General;  Laterality: N/A;  ERAS PATHWAY    Prior to Admission medications   Medication Sig Start Date End Date Taking? Authorizing Provider  allopurinol (ZYLOPRIM) 300 MG tablet Take 300 mg by mouth daily.    Yes [provider]  amiodarone (PACERONE) 100 MG tablet Take 1 tablet (100 mg total) by mouth daily. 06/04/20 07/04/20 Yes Pahwani, Einar Grad, MD  carvedilol (COREG) 12.5 MG tablet Take 1 tablet (12.5 mg total) by mouth 2 (two) times daily. 06/03/20 07/03/20 Yes Darliss Cheney, MD  colchicine 0.6 MG tablet Take 0.6 mg by mouth daily as needed (gout). 02/06/18  Yes [provider]  cromolyn (OPTICROM) 4 % ophthalmic solution Place 1 drop into both eyes 2 (two) times daily. 06/14/20  Yes [provider]  ferrous sulfate 325 (65 FE) MG tablet Take 1 tablet (325 mg total) by mouth daily. 03/04/20 03/04/21 Yes [place] Eric J, DO  gabapentin (NEURONTIN) 300 MG capsule Take 1 capsule (300 mg total) by mouth 2 (two) times daily. 07/17/19  Yes Billie Ruddy, MD  isosorbide-hydrALAZINE (BIDIL) 20-37.5 MG tablet Take 1 tablet by mouth 3 (three) times daily. 12/05/19  Yes [name], MD  levothyroxine (SYNTHROID) 137 MCG tablet Take 1 tablet (137 mcg total) by mouth daily before breakfast. 03/11/20  Yes Billie Ruddy, MD  nitroGLYCERIN (NITROSTAT) 0.4 MG SL tablet Place 0.4 mg under the tongue every 5 (five) minutes x 3 doses as needed for chest pain.    Yes [provider]  pantoprazole (PROTONIX) 40 MG tablet Take 1 tablet (40 mg total) by mouth daily. 04/06/20 04/06/21 Yes Clegg, [name] D, NP  potassium chloride SA  (KLOR-CON) 20 MEQ tablet Take 1 tablet (20 mEq total) by mouth daily. 06/18/20  Yes [name], MD  spironolactone (ALDACTONE) 25 MG tablet Take 0.5 tablets (12.5 mg total) by mouth daily. 12/05/19 03/23/22 Yes [name], MD  tiZANidine (ZANAFLEX) 2 MG tablet Take 1 tablet (2 mg total) by mouth at bedtime as needed for muscle spasms. 04/07/20  Yes Billie Ruddy, MD  torsemide (DEMADEX) 20 MG tablet Take 2 tablets (40 mg total) by mouth daily. Patient taking differently: Take 20 mg by mouth 2 (two) times daily. 06/21/20  Yes [place], Maricela Bo, FNP  traZODone (DESYREL) 50 MG tablet Take 50 mg by mouth at bedtime.   Yes [provider]    Current Facility-Administered Medications  Medication Dose Route Frequency Provider Last Rate Last Admin  . 0.9 %  sodium chloride infusion  250 mL Intravenous PRN  [name], MD      . 0.9 %  sodium chloride infusion   Intravenous Continuous [name], MD 10 mL/hr at 06/23/20 [phone] New Bag at 06/23/20 [phone]  . 0.9 %  sodium chloride infusion  250 mL Intravenous PRN [name], MD      . 0.9 %  sodium chloride infusion  250 mL Intravenous PRN [name], MD      . acetaminophen (TYLENOL) tablet 650 mg  650 mg Oral Q4H PRN [name], MD      . allopurinol (ZYLOPRIM) tablet 300 mg  300 mg Oral Daily [name], MD      . amiodarone (PACERONE) tablet 100 mg  100 mg Oral Daily [name], MD      . carvedilol (COREG) tablet 12.5 mg  12.5 mg Oral BID WC [name], MD      . Derrill Memo ON 06/24/2020] ferrous sulfate tablet 325 mg  325 mg Oral Q breakfast [name], MD      . furosemide (LASIX) injection 80 mg  80 mg Intravenous BID [name], MD   80 mg at 06/23/20 1017  . gabapentin (NEURONTIN) capsule 300 mg  300 mg Oral BID [name], MD      . heparin injection 5,000 Units  5,000 Units Subcutaneous Q8H [name], MD      . hydrALAZINE (APRESOLINE) injection 10 mg  10 mg  Intravenous Q20 Min PRN [name], MD   10 mg at 06/23/20 1040  . isosorbide-hydrALAZINE (BIDIL) 20-37.5 MG per tablet 1 tablet  1 tablet Oral TID [name], MD      . labetalol (NORMODYNE) injection 10 mg  10 mg Intravenous Q10 min PRN [name], MD      . levothyroxine (SYNTHROID) tablet 137 mcg  137 mcg Oral Q0600 [name], MD      . ondansetron [facility]) injection 4 mg  4 mg Intravenous Q6H PRN [name], MD      . pantoprazole (PROTONIX) EC tablet 40 mg  40 mg Oral Daily [name], MD      . sodium chloride flush (NS) 0.9 % injection 3 mL  3 mL Intravenous Q12H [name], MD      . sodium chloride flush (NS) 0.9 % injection 3 mL  3 mL Intravenous PRN [name], MD      . sodium chloride flush (NS) 0.9 % injection 3 mL  3 mL Intravenous Q12H [name], MD      . sodium chloride flush (NS) 0.9 % injection 3 mL  3 mL Intravenous PRN [name], MD      . traZODone (DESYREL) tablet 50 mg  50 mg Oral QHS [name], MD        Allergies as of 06/21/2020  . (No Known Allergies)    Family History  Problem Relation Age of Onset  . Asthma Father   . Heart attack Father   . Asthma Sister   . Lung cancer Sister   . Heart attack Mother   . Stroke Brother   . Colon cancer Neg Hx   . Esophageal cancer Neg Hx   . Pancreatic cancer Neg Hx   . Stomach cancer Neg Hx   . Liver disease Neg Hx     Social History   Socioeconomic History  . Marital status: Divorced  Spouse name: Not on file  . Number of children: 1  . Years of education: 65  . Highest education level: Not on file  Occupational History  . Not on file  Tobacco Use  . Smoking status: Never Smoker  . Smokeless tobacco: Never Used  Vaping Use  . Vaping Use: Never used  Substance and Sexual Activity  . Alcohol use: Yes    Comment: 01/03/2018 "couple drinks/month"  . Drug use: Yes    Types: Marijuana    Comment: VERY INTERMITTENT   . Sexual  activity: Not Currently  Other Topics Concern  . Not on file  Social History Narrative   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      Home Situation: lives alone      Spiritual Beliefs: Christian - [employer]      Lifestyle: no regular exercise; poor diet      She is divorced at least one adult child   Never smoker    rare alcohol to occasional at best    no substance abuse         Social Determinants of Radio broadcast assistant Strain: Not on file  Food Insecurity: Not on file  Transportation Needs: Not on file  Physical Activity: Not on file  Stress: Not on file  Social Connections: Not on file  Intimate Partner Violence: Not on file    Review of Systems: Gen: Denies any fever, chills, sweats, anorexia, fatigue, weakness, malaise, weight loss, and sleep disorder HEENT: No visual complaints, No history of Retinopathy. Normal external appearance No Epistaxis or Sore throat. No sinusitis.   CV: Denies chest pain, angina, palpitations, syncope, admits to dyspnea on exertion Resp: Some dyspnea to conversation GI: Jejunal AVMs noted recent history of GI bleed status post transfusion.  History of thorough work-up with capsule endoscopy Dr. Arelia Longest GU : Denies urinary burning, blood in urine, urinary frequency, urinary hesitancy, nocturnal urination, and urinary incontinence.  No renal calculi. MS: Denies joint pain, limitation of movement, and swelling, stiffness, low back pain, extremity pain. Denies muscle weakness, cramps, atrophy.  No use of non steroidal antiinflammatory drugs. Derm: Denies rash, itching, dry skin, hives, moles, warts, or unhealing ulcers.  Psych: Denies depression, anxiety, memory loss, suicidal ideation, hallucinations, paranoia, and confusion. Heme: Denies bruising, bleeding, and enlarged lymph nodes. Neuro: No headache.  No diplopia. No dysarthria.  No dysphasia.  No history of CVA.  No Seizures. No paresthesias.  No weakness. Endocrine No DM.   Hypothyroidism.  No Adrenal disease.  Physical Exam: Vital signs in last 24 hours: Temp:  [97.6 F (36.4 C)] 97.6 F (36.4 C) (02/16 0631) Pulse Rate:  [58-67] 59 (02/16 1035) Resp:  [12-26] 14 (02/16 1035) BP: (137-188)/(55-81) 184/60 (02/16 1035) SpO2:  [97 %-100 %] 98 % (02/16 1035) Weight:  [61.7 kg] 61.7 kg (02/16 0631)   General:   Alert, mildly dyspneic short of breath conversation Head:  Normocephalic and atraumatic. Eyes:  Sclera clear, no icterus.   Conjunctiva pink. Ears:  Normal auditory acuity. Nose:  No deformity, discharge,  or lesions. Mouth:  No deformity or lesions, dentition normal. Neck:  Supple; no masses or thyromegaly. JVP elevated Lungs:  Clear throughout to auscultation.   No wheezes, crackles, or rhonchi. No acute distress. Heart:  Regular rate and rhythm; no murmurs, clicks, rubs,  or gallops. Abdomen:  Soft, nontender and nondistended. No masses, hepatosplenomegaly or hernias noted. Normal bowel sounds, without guarding, and without rebound.   Msk:  Symmetrical without gross deformities. Normal posture. Pulses:  No carotid, renal, femoral bruits. DP and PT symmetrical and equal Extremities:  Without clubbing or edema. Neurologic:  Alert and  oriented x4;  grossly normal neurologically. Skin:  Intact without significant lesions or rashes.   Intake/Output from previous day: No intake/output data recorded. Intake/Output this shift: No intake/output data recorded.  Lab Results: Recent Labs    06/23/20 0931  HGB 9.9*  9.5*  HCT 29.0*  28.0*   BMET Recent Labs    06/21/20 1501 06/23/20 0931  NA 137 144  144  K 4.2 3.7  3.6  CL 100  --   CO2 23  --   GLUCOSE 101*  --   BUN 38*  --   CREATININE 3.25*  --   CALCIUM 9.2  --    LFT No results for input(s): PROT, ALBUMIN, AST, ALT, ALKPHOS, BILITOT, BILIDIR, IBILI in the last 72 hours. PT/INR No results for input(s): LABPROT, INR in the last 72 hours. Hepatitis Panel No results for  input(s): HEPBSAG, HCVAB, HEPAIGM, HEPBIGM in the last 72 hours.  Studies/Results: CARDIAC CATHETERIZATION  Result Date: 06/23/2020 1. Elevated filling pressures, RV failure out of proportion to left-sided failure. 2. PAPI 1.7 suggesting RV dysfunction. 3. Preserved cardiac output. 4. Moderate pulmonary venous hypertension. Will admit patient for close monitoring of creatinine with IV diuresis.    Assessment/Plan:  Chronic renal insufficiency stage IV.  Appears to be some mild progression of her disease.  This is probably secondary to cardiorenal syndrome does not appear to be any evidence of nephrotoxin use.  She is not taking an ACE inhibitor or ARB.  There has been no use of nonsteroidal anti-inflammatory drugs.  She has not received any IV contrast.  She did undergo right-sided heart catheterization on 06/23/2020.  Continue daily renal panel, close monitoring of I's and O's and avoidance of nephrotoxins  ANEMIA-history of GI bleed will need close follow-up.  There appears to be a contraindication for the use of any anticoagulation in this lady at this time.  MBD-we will check PTH and follow calcium phosphorus.  HTN/VOL-agree with the use of IV diuretics.  We will continue to follow along with cardiology to see if we obtain a good diuretic response  Congestive heart failure with biventricular pacing.  Postop some diastolic dysfunction on last 2D echo 02/24/2020  Hypothyroidism on replacement therapy  Nonischemic cardiomyopathy negative Cardiolite study 12/15  Hypertension appears to be stable  Diabetes mellitus diet controlled  Atrial fibrillation not a candidate for anticoagulation secondary to bleeding.   LOS: 0 Sherril Croon @TODAY @10 :56 AM

## 2020-06-23 NOTE — Progress Notes (Signed)
At Home-HF  Telephone Script Day: 6 phone call    Date of Phone Call: February 16th, 2022                                    Time: N/A                                                     Subject ID: [id number]        Phone Call Performed:  []  Yes  [x]  No  If No,   []  Subject did not respond   []  Subject unavailable   [x]  Other, specify: Subject had a right heart catheterization, and was admitted to hospital today for worsening heart failure and worsening kidney function for further                                          evaluation and monitoring of it.      Weight and Blood pressure:  1. Did you use the provided scale to weigh yourself this morning? []  Yes  []  No   If Yes, Did you document it in the diary? []  Yes []  No  What was your weight today? _____ lbs.  If No, please ask to the subject to weigh himself/herself now and obtain the measurement. _____ lbs.     2. Did you use the provided "cuff" to take your Blood Pressure and Heart Rate this morning? []  Yes  []  No  If yes, did you document it in the Diary?  []  Yes  []  No    What was your Blood Pressure and Heart Rate today?   Heart Rate: _______           Blood Pressure: ________         Patient Global Assessment Visual Analog Scale (VAS): Please ask if subject completed the Visual Analog Scale in the Diary.  []  Yes []  No If No, Ask to complete now and remind the subject to complete it before next scheduled call or visit.       5-Point Likert Scale: Current Dyspnea Status How much difficulty are you having in breathing now?  []  not short of breath               []  mildly short of breath             []  moderately short of breath             []  severely short of breath                  []  very severely short of breath     7-Point Likert Scale: Patient-Assessed Dyspnea Status Compared to how much difficulty you were having with your breathing just before starting in the study, how is your  breathing now?  []  Markedly better       []  Moderately better       []  Minimally better       []  No change       []  Minimally worse       []  Moderately worse       []  Markedly worse  Medications/Changes and Medical Events:  1. How much oral diuretic did you take yesterday? ___________   2. Did you document in the Diary start time and end time for Furoscix Infusor (if applicable)? []  Yes []  No []  N/A   3. Have you had any problems/issues with Infusor? []  Yes []  No []  N/A If yes, please describe: ______________________________________________   4. Since your last visit/call, have you started any new medications or have your medications changed? (review compare to last visit/call)  []  Yes []  No If yes, please specify:________________________________________________   5. Since your last visit/call, have you had any medical events, have you visited a hospital, Emergency Department or seen any other health care provider?  []  Yes []  No If yes, please describe (including treatments):_____________________________       Since the patient is admitted and the telephone call was not completed; a protocol deviation has been completed and the SAE for the subject's current admission has been completed.

## 2020-06-23 NOTE — Interval H&P Note (Signed)
History and Physical Interval Note:  06/23/2020 8:51 AM  Morgan Clay  has presented today for surgery, with the diagnosis of heart failure.  The various methods of treatment have been discussed with the patient and family. After consideration of risks, benefits and other options for treatment, the patient has consented to  Procedure(s): RIGHT HEART CATH (N/A) as a surgical intervention.  The patient's history has been reviewed, patient examined, no change in status, stable for surgery.  I have reviewed the patient's chart and labs.  Questions were answered to the patient's satisfaction.     D[place] [employer]

## 2020-06-23 NOTE — Progress Notes (Signed)
   Nephrology consulted at the request of Dr [name] for RV Failure/AKI.   Dr Justin Mend contacted.   Shellie Goettl NP-C  10:22 AM

## 2020-06-23 NOTE — Progress Notes (Signed)
Site area: rt groin fv sheath Site Prior to Removal:  Level 0 Pressure Applied For: 10 minutes Manual:   yes Patient Status During Pull:  stable Post Pull Site:  Level 0 Post Pull Instructions Given:  yes Post Pull Pulses Present: yes Dressing Applied:  Gauze and tegaderm Bedrest begins @ 1010 Comments:

## 2020-06-24 ENCOUNTER — Inpatient Hospital Stay (HOSPITAL_COMMUNITY): Payer: Medicare Other

## 2020-06-24 DIAGNOSIS — I5033 Acute on chronic diastolic (congestive) heart failure: Secondary | ICD-10-CM | POA: Diagnosis not present

## 2020-06-24 DIAGNOSIS — I5032 Chronic diastolic (congestive) heart failure: Secondary | ICD-10-CM | POA: Diagnosis not present

## 2020-06-24 LAB — RENAL FUNCTION PANEL
Albumin: 3.6 g/dL (ref 3.5–5.0)
Anion gap: 12 (ref 5–15)
BUN: 36 mg/dL — ABNORMAL HIGH (ref 8–23)
CO2: 24 mmol/L (ref 22–32)
Calcium: 9 mg/dL (ref 8.9–10.3)
Chloride: 101 mmol/L (ref 98–111)
Creatinine, Ser: 3.04 mg/dL — ABNORMAL HIGH (ref 0.44–1.00)
GFR, Estimated: 16 mL/min — ABNORMAL LOW (ref >60)
Glucose, Bld: 86 mg/dL (ref 70–99)
Phosphorus: 4.1 mg/dL (ref 2.5–4.6)
Potassium: 3.5 mmol/L (ref 3.5–5.1)
Sodium: 137 mmol/L (ref 135–145)

## 2020-06-24 LAB — CBC
HCT: 25.7 % — ABNORMAL LOW (ref 36.0–46.0)
Hemoglobin: 8.5 g/dL — ABNORMAL LOW (ref 12.0–15.0)
MCH: 28.9 pg (ref 26.0–34.0)
MCHC: 33.1 g/dL (ref 30.0–36.0)
MCV: 87.4 fL (ref 80.0–100.0)
Platelets: 203 10*3/uL (ref 150–400)
RBC: 2.94 MIL/uL — ABNORMAL LOW (ref 3.87–5.11)
RDW: 18.7 % — ABNORMAL HIGH (ref 11.5–15.5)
WBC: 4.8 10*3/uL (ref 4.0–10.5)
nRBC: 0 % (ref 0.0–0.2)

## 2020-06-24 LAB — PARATHYROID HORMONE, INTACT (NO CA): PTH: 68 pg/mL — ABNORMAL HIGH (ref 15–65)

## 2020-06-24 LAB — ECHOCARDIOGRAM COMPLETE
AV Vena cont: 0.4 cm
Area-P 1/2: 3.17 cm2
Height: 59 in
P 1/2 time: 432 msec
S' Lateral: 2.8 cm
Weight: 2184 oz

## 2020-06-24 LAB — GLUCOSE, CAPILLARY
Glucose-Capillary: 91 mg/dL (ref 70–99)
Glucose-Capillary: 115 mg/dL — ABNORMAL HIGH (ref 70–99)
Glucose-Capillary: 88 mg/dL (ref 70–99)

## 2020-06-24 MED ORDER — SODIUM CHLORIDE 0.9 % IV SOLN: 500.0000 mg | INTRAVENOUS | Status: DC

## 2020-06-24 MED ORDER — SODIUM CHLORIDE 0.9 % IV SOLN
510.0000 mg | Freq: Once | INTRAVENOUS | Status: AC
  Administered 2020-06-27: 510 mg via INTRAVENOUS
  Filled 2020-06-24: qty 17

## 2020-06-24 MED ORDER — METOLAZONE 2.5 MG PO TABS
2.5000 mg | ORAL_TABLET | Freq: Once | ORAL | Status: AC
  Administered 2020-06-24: 2.5 mg via ORAL
  Filled 2020-06-24: qty 1

## 2020-06-24 MED ORDER — FUROSEMIDE 10 MG/ML IJ SOLN
160.0000 mg | Freq: Three times a day (TID) | INTRAVENOUS | Status: DC
  Administered 2020-06-24 – 2020-06-28 (×12): 160 mg via INTRAVENOUS
  Filled 2020-06-24: qty 16
  Filled 2020-06-24 (×2): qty 10
  Filled 2020-06-24 (×3): qty 16
  Filled 2020-06-24 (×2): qty 10
  Filled 2020-06-24: qty 16
  Filled 2020-06-24 (×2): qty 10
  Filled 2020-06-24 (×2): qty 2

## 2020-06-24 NOTE — Progress Notes (Signed)
  Mobility Specialist Criteria Algorithm Info.   Mobility Team: HOB elevated: Activity: Ambulated in hall; Dangled on edge of bed Range of motion: Active; All extremities Level of assistance: Standby assist, set-up cues, supervision of patient - no hands on Assistive device: Front wheel walker Minutes sitting in chair:  Minutes stood: 5 minutes Minutes ambulated: 5 minutes Distance ambulated (ft): 90 ft Mobility response: Tolerated well Bed Position: Chair  Patient received dangling EOB willing to participate in mobility. Mentioned being less ambulatory recently due to being SOB and is weak. She stood and ambulated in hallway 90 feet with RW at min guard and with very slow but steady gait. Required cues for hand placement and to stand closer to RW. Patient was fatigued but tolerated ambulation well without incident or complaint.   06/24/2020 11:37 AM

## 2020-06-24 NOTE — Progress Notes (Signed)
  Echocardiogram 2D Echocardiogram has been performed.  Morgan Clay 06/24/2020, 12:02 PM

## 2020-06-24 NOTE — Progress Notes (Addendum)
Advanced Heart Failure Rounding Note  PCP-Cardiologist: No primary care provider on file.   Subjective:    Feels a little bit better today. Breathing slightly improved but continues w/ abdominal fulness.   Suboptimal diuresis yesterday despite 80 mg IV lasix x 2. Only 1.4L in UOP. Wt up 1 lb. SCr higher today, 2.92>>3.04.  K 3.5   RHC Procedural Findings (2/17): Hemodynamics (mmHg) RA mean 21 RV 49/20 PA 59/24, mean 39 PCWP mean 26 Oxygen saturations: PA 64% AO 98% Cardiac Output (Fick) 5.07  Cardiac Index (Fick) 3.23 PVR 2.6 WU Cardiac Output (Thermo) 4.72  Cardiac Index (Thermo) 3.01  PVR 2.75 WU PAPI 1.7  Objective:   Weight Range: 61.9 kg Body mass index is 27.57 kg/m.   Vital Signs:   Temp:  [98.4 F (36.9 C)-98.6 F (37 C)] 98.6 F (37 C) (02/17 0317) Pulse Rate:  [58-67] 60 (02/17 0317) Resp:  [12-26] 16 (02/17 0317) BP: (108-191)/(49-75) 108/52 (02/17 0317) SpO2:  [96 %-100 %] 100 % (02/17 0317) Weight:  [61.6 kg-61.9 kg] 61.9 kg (02/17 0317)    Weight change: Filed Weights   06/23/20 0631 06/23/20 1200 06/24/20 0317  Weight: 61.7 kg 61.6 kg 61.9 kg    Intake/Output:   Intake/Output Summary (Last 24 hours) at 06/24/2020 0731 Last data filed at 06/24/2020 0600 Gross per 24 hour  Intake 357 ml  Output 1450 ml  Net -1093 ml      Physical Exam    General: fatigue appearing. No resp difficulty HEENT: Normal Neck: Supple. JVP elevated to ear . Carotids 2+ bilat; no bruits. No lymphadenopathy or thyromegaly appreciated. Cor: PMI nondisplaced. Regular rate & rhythm. No rubs, gallops or murmurs. Lungs: decreased BS at the bases  Abdomen: Soft, nontender, nondistended. No hepatosplenomegaly. No bruits or masses. Good bowel sounds. Extremities: No cyanosis, clubbing, rash, edema Neuro: Alert & orientedx3, cranial nerves grossly intact. moves all 4 extremities w/o difficulty. Affect pleasant   Telemetry   V paced 60s   EKG    No new  EKG to review today   Labs    CBC Recent Labs    06/23/20 1224 06/24/20 0247  WBC 5.6 4.8  NEUTROABS 4.0  --   HGB 9.9* 8.5*  HCT 31.0* 25.7*  MCV 89.3 87.4  PLT 215 329   Basic Metabolic Panel Recent Labs    06/21/20 1501 06/23/20 0931 06/23/20 1224 06/24/20 0247  NA 137   < > 140 137  K 4.2   < > 4.1 3.5  CL 100  --  105 101  CO2 23  --  22 24  GLUCOSE 101*  --  109* 86  BUN 38*  --  38* 36*  CREATININE 3.25*  --  2.92* 3.04*  CALCIUM 9.2  --  9.6 9.0  MG 2.4  --   --   --   PHOS  --   --   --  4.1   < > = values in this interval not displayed.   Liver Function Tests Recent Labs    06/23/20 1224 06/24/20 0247  AST 19  --   ALT 11  --   ALKPHOS 88  --   BILITOT 0.5  --   PROT 8.3*  --   ALBUMIN 4.2 3.6   No results for input(s): LIPASE, AMYLASE in the last 72 hours. Cardiac Enzymes No results for input(s): CKTOTAL, CKMB, CKMBINDEX, TROPONINI in the last 72 hours.  BNP: BNP (last 3 results) Recent Labs  06/18/20 1300 06/21/20 1501 06/23/20 1224  BNP 1,147.8* 981.7* 1,099.2*    ProBNP (last 3 results) Recent Labs    07/17/19 1352 07/23/19 1033  PROBNP 811.0* 637.0*     D-Dimer No results for input(s): DDIMER in the last 72 hours. Hemoglobin A1C No results for input(s): HGBA1C in the last 72 hours. Fasting Lipid Panel No results for input(s): CHOL, HDL, LDLCALC, TRIG, CHOLHDL, LDLDIRECT in the last 72 hours. Thyroid Function Tests Recent Labs    06/23/20 1224  TSH 3.588  3.392    Other results:   Imaging    DG Chest 2 View  Result Date: 06/23/2020 CLINICAL DATA:  CHF. EXAM: CHEST - 2 VIEW COMPARISON:  05/27/2020 FINDINGS: The cardio pericardial silhouette is enlarged. Diffuse airspace disease noted right lower lung and mild retrocardiac left base collapse/consolidation evident. Left pacer/AICD noted. Telemetry leads overlie the chest. IMPRESSION: Bilateral airspace disease with basilar predominance is progressive in the  interval. This may be related to pulmonary edema or infection. Electronically Signed   By: Misty Stanley M.D.   On: 06/23/2020 14:53   NM Pulmonary Perfusion  Result Date: 06/23/2020 CLINICAL DATA:  Shortness of breath for couple weeks, elevated D-dimer, low to intermediate clinical probability for pulmonary embolism EXAM: NUCLEAR MEDICINE PERFUSION LUNG SCAN TECHNIQUE: Perfusion images were obtained in multiple projections after intravenous injection of radiopharmaceutical. Ventilation scans intentionally deferred if perfusion scan and chest x-ray adequate for interpretation during COVID 19 epidemic. RADIOPHARMACEUTICALS:  4.3 mCi Tc-41m MAA IV COMPARISON:  None Correlation: Chest radiograph 06/23/2020 FINDINGS: Enlargement of cardiac silhouette and pulmonary hila. No wedge-shaped segmental or subsegmental perfusion defects are identified to suggest pulmonary embolism. IMPRESSION: Cardiomegaly and slightly prominent pulmonary hila. No scintigraphic evidence of pulmonary embolism. Electronically Signed   By: Lavonia [name] M.D.   On: 06/23/2020 15:39   CARDIAC CATHETERIZATION  Result Date: 06/23/2020 1. Elevated filling pressures, RV failure out of proportion to left-sided failure. 2. PAPI 1.7 suggesting RV dysfunction. 3. Preserved cardiac output. 4. Moderate pulmonary venous hypertension. Will admit patient for close monitoring of creatinine with IV diuresis.      Medications:     Scheduled Medications: . allopurinol  300 mg Oral Daily  . amiodarone  100 mg Oral Daily  . carvedilol  12.5 mg Oral BID WC  . furosemide  80 mg Intravenous BID  . gabapentin  300 mg Oral BID  . heparin  5,000 Units Subcutaneous Q8H  . isosorbide-hydrALAZINE  1 tablet Oral TID  . levothyroxine  137 mcg Oral Q0600  . pantoprazole  40 mg Oral Daily  . sodium chloride flush  3 mL Intravenous Q12H  . traZODone  50 mg Oral QHS     Infusions: . sodium chloride    . sodium chloride       PRN  Medications:  sodium chloride, sodium chloride, acetaminophen, ondansetron (ZOFRAN) IV, sodium chloride flush, traMADol    Assessment/Plan   1. Chronic primarily diastolic CHF with prominent RV dysfunction: Patient has history of presumed nonischemic cardiomyopathy, EF as low at 25-30% in 12/15.  Medtronic CRT-D device placed with improvement in LV function, echo in 4/19 showed EF improved to 60-65%. PYP scan in 3/20 was equivocal for transthyretin amyloidosis, PYP scan in 1/22 was not suggestive of transthyretin amyloidosis.Echo in 9/20 showed EF back down to 45-50%. LHC/RHC in 8/21 showed nonobstructive CAD, normal filling pressures and cardiac output. Echo 8/21 showed EF 50-55%, mildly decreased RV systolic function, moderate AI. NYHA class IIIb symptoms currently,  progressive volume overload and renal failure recently.  Roy 2/16 showed elevated R>L heart filling pressures with low PAPI at 1.7 and preserved cardiac output. V/Q scan not suggestive of chronic PEs.  Decision made to admit for diuresis and monitoring of renal function.  - Suboptimal diuresis yesterday despite 80 mg IV lasix x 2. Only 1.4L in UOP. Wt up 1 lb. SCr higher today, 2.92>>3.04. Nephrology now following.  - ? Trial of metolazone vs dose increase of lasix. Defer diuretic dosing to nephrology  - Continue Bidil 1 tab tid.  - Off spironolactone with creatinine up to 3.25 recently.  - Continue Coreg 12.5 mg bid.  - Repeat echo ordered  2. Atrial flutter/fibrillation: History of atypical flutter and atrial fibrillation.Lately has been in NSR on amiodarone 100 mg daily. She is off apixaban with recurrent severe GI bleeding.  - Continue amiodarone. TSH and LFTs ok. She will need a regular eye exam.  - Has been referred to Dr. Quentin Ore for evaluation for Watchman device. 3. CKD stage 3b: Followed by nephrology, steady worsening of renal function with diuresis, most recently creatinine 3.25. Trend this admit 2.94>>3.02.  -  c/w IV diuresis, hopefully renal function will improve with lowering of renal venous pressure with diuresis.  - nephrology now following, await recs for today - follow BMP  - avoid hypotension  4. Anemia: Recurrent severe GI bleeding. She is now off Eliquis.  - Hgb 8.5 c/w baseline  - Fe 23 and Sats 5% c/w IDA - Received Feraheme 2/16  - As above, Watchman evaluation eventually. 5. Hypertension:Continue current meds.  - avoid hypotension w/ AKI   Length of Stay: 1  Brittainy Simmons, PA-C  06/24/2020, 7:31 AM  Advanced Heart Failure Team Pager [phone] (M-F; 7a - 4p)  Please contact [facility] for night-coverage after hours (4p -7a ) and weekends on [email or web address]  Patient seen with PA, agree with the above note.   Some diuresis yesterday, renal function fairly stable with creatinine 3.04.  Feels "a little better."  V/Q study not suggestive of chronic PEs.   General: NAD Neck: JVP 16 cm, no thyromegaly or thyroid nodule.  Lungs: Clear to auscultation bilaterally with normal respiratory effort. CV: Nondisplaced PMI.  Heart regular S1/S2, no S3/S4, no murmur.  No peripheral edema.  No carotid bruit.  Normal pedal pulses.  Abdomen: Soft, nontender, no hepatosplenomegaly, no distention.  Skin: Intact without lesions or rashes.  Neurologic: Alert and oriented x 3.  Psych: Normal affect. Extremities: No clubbing or cyanosis.  HEENT: Normal.   Elevated left and right heart filling pressures with especially prominent RV failure on RHC.  Still quite volume overloaded.  Complicated by CKD stage IV. Creatinine fairly stable at 3.  - Continue Lasix 80 mg IV bid today, will give metolazone 2.5 x 1 with am Lasix.  - Awaiting echo.   Ambulate.   Loralie Champagne 06/24/2020 8:45 AM

## 2020-06-24 NOTE — Progress Notes (Addendum)
[facility] ROUNDING NOTE   Subjective:   Interval History:75 year old lady with history of nonischemic cardiomyopathy, she underwent Medtronic CRT-D biventricular pacer with improved ejection fraction last 2D echo showing ejection fraction of 60-65%.  She had a Cardiolite study done that showed no evidence of any ischemia.  She has had problems with GI bleeding and has been on Eliquis for atrial flutter.  She developed a gastric ulcer and noted to have small multiple lesions on AVMs on capsule endoscopy.  She had a right-sided heart catheterization 12/27/2019 with nonobstructive coronary artery disease.  She is undergoing right sided cardiac catheterization 06/23/2020 without the use of IV contrast she has a history of diabetes is diet controlled, history of gout, history of atrial flutter, history of hepatitis C treated with Harvoni, history of small bowel obstruction and history of GI bleed.  Chronic kidney disease is being followed by [name] since she last saw [name] ( who is since retired). Her most recent hospitalization 05/27/2020 was anemia and GI bleed status post colonoscopy and 2 units packed red blood cells with discontinuation of Eliquis.  Blood pressure 108/52 pulse 60 temperature 98.2 O2 sats 100%. Urine output 1.25 L 06/23/2020  Sodium 137 potassium 3.5 chloride 101 CO2 24 BUN 36 creatinine 3 calcium 9 phosphorus 4 albumin 3.6. Hemoglobin 8.9. Iron saturations 5%.      Objective:  Vital signs in last 24 hours:  Temp:  [98.4 F (36.9 C)-98.6 F (37 C)] 98.6 F (37 C) (02/17 0317) Pulse Rate:  [58-66] 60 (02/17 0317) Resp:  [12-23] 16 (02/17 0317) BP: (108-191)/(49-73) 108/52 (02/17 0317) SpO2:  [96 %-100 %] 100 % (02/17 0317) Weight:  [61.6 kg-61.9 kg] 61.9 kg (02/17 0317)  Weight change: -0.136 kg Filed Weights   06/23/20 0631 06/23/20 1200 06/24/20 0317  Weight: 61.7 kg 61.6 kg 61.9 kg    Intake/Output: I/O last 3 completed  shifts: In: 357 [P.O.:240; IV Piggyback:117] Out: 1450 [Urine:1450]   Intake/Output this shift:  No intake/output data recorded.  Mildly dyspneic CVS-regular rate and rhythm JVP elevated RS-clear ABD- BS present soft non-distended EXT- no edema   Basic Metabolic Panel: Recent Labs  Lab 06/18/20 1304 06/18/20 1304 06/18/20 1331 06/21/20 1501 06/23/20 0931 06/23/20 1224 06/24/20 0247  NA 141  --  141 137 144  144 140 137  K 4.2  --  4.1 4.2 3.7  3.6 4.1 3.5  CL 104  --  106 100  --  105 101  CO2  --   --  25 23  --  22 24  GLUCOSE 121*  --  125* 101*  --  109* 86  BUN 33*  --  32* 38*  --  38* 36*  CREATININE 2.30*  --  2.54* 3.25*  --  2.92* 3.04*  CALCIUM  --    < > 9.5 9.2  --  9.6 9.0  MG  --   --   --  2.4  --   --   --   PHOS  --   --   --   --   --   --  4.1   < > = values in this interval not displayed.    Liver Function Tests: Recent Labs  Lab 06/18/20 1331 06/23/20 1224 06/24/20 0247  AST 18 19  --   ALT 12 11  --   ALKPHOS 82 88  --   BILITOT 1.0 0.5  --   PROT 7.7 8.3*  --  ALBUMIN 3.9 4.2 3.6   No results for input(s): LIPASE, AMYLASE in the last 168 hours. No results for input(s): AMMONIA in the last 168 hours.  CBC: Recent Labs  Lab 06/18/20 1304 06/18/20 1331 06/23/20 0931 06/23/20 1224 06/24/20 0247  WBC  --  6.2  --  5.6 4.8  NEUTROABS  --   --   --  4.0  --   HGB 10.9* 8.9* 9.9*  9.5* 9.9* 8.5*  HCT 32.0* 28.1* 29.0*  28.0* 31.0* 25.7*  MCV  --  90.1  --  89.3 87.4  PLT  --  229  --  215 203    Cardiac Enzymes: No results for input(s): CKTOTAL, CKMB, CKMBINDEX, TROPONINI in the last 168 hours.  BNP: Invalid input(s): POCBNP  CBG: Recent Labs  Lab 06/23/20 0957 06/23/20 1150 06/23/20 1601 06/23/20 2124 06/24/20 0602  GLUCAP 111* 110* 167* 91 91    Microbiology: Results for orders placed or performed during the hospital encounter of 06/22/20  SARS CORONAVIRUS 2 (TAT 6-24 HRS) Nasopharyngeal Nasopharyngeal Swab      Status: None   Collection Time: 06/22/20 11:19 AM   Specimen: Nasopharyngeal Swab  Result Value Ref Range Status   SARS Coronavirus 2 NEGATIVE NEGATIVE Final    Comment: (NOTE) SARS-CoV-2 target nucleic acids are NOT DETECTED.  The SARS-CoV-2 RNA is generally detectable in upper and lower respiratory specimens during the acute phase of infection. Negative results do not preclude SARS-CoV-2 infection, do not rule out co-infections with other pathogens, and should not be used as the sole basis for treatment or other patient management decisions. Negative results must be combined with clinical observations, patient history, and epidemiological information. The expected result is Negative.  Fact Sheet for Patients: SugarRoll.be  Fact Sheet for Healthcare Providers: https://www.woods-mathews.com/  This test is not yet approved or cleared by the [place] FDA and  has been authorized for detection and/or diagnosis of SARS-CoV-2 by FDA under an Emergency Use Authorization (EUA). This EUA will remain  in effect (meaning this test can be used) for the duration of the COVID-19 declaration under Se ction 564(b)(1) of the Act, 21 U.S.C. section 360bbb-3(b)(1), unless the authorization is terminated or revoked sooner.  Performed at [facility], [address], [place], [place] [address]    *Note: Due to a large number of results and/or encounters for the requested time period, some results have not been displayed. A complete set of results can be found in Results Review.    Coagulation Studies: No results for input(s): LABPROT, INR in the last 72 hours.  Urinalysis: No results for input(s): COLORURINE, LABSPEC, PHURINE, GLUCOSEU, HGBUR, BILIRUBINUR, KETONESUR, PROTEINUR, UROBILINOGEN, NITRITE, LEUKOCYTESUR in the last 72 hours.  Invalid input(s): APPERANCEUR    Imaging: DG Chest 2 View  Result Date: 06/23/2020 CLINICAL DATA:   CHF. EXAM: CHEST - 2 VIEW COMPARISON:  05/27/2020 FINDINGS: The cardio pericardial silhouette is enlarged. Diffuse airspace disease noted right lower lung and mild retrocardiac left base collapse/consolidation evident. Left pacer/AICD noted. Telemetry leads overlie the chest. IMPRESSION: Bilateral airspace disease with basilar predominance is progressive in the interval. This may be related to pulmonary edema or infection. Electronically Signed   By: Misty Stanley M.D.   On: 06/23/2020 14:53   NM Pulmonary Perfusion  Result Date: 06/23/2020 CLINICAL DATA:  Shortness of breath for couple weeks, elevated D-dimer, low to intermediate clinical probability for pulmonary embolism EXAM: NUCLEAR MEDICINE PERFUSION LUNG SCAN TECHNIQUE: Perfusion images were obtained in multiple  projections after intravenous injection of radiopharmaceutical. Ventilation scans intentionally deferred if perfusion scan and chest x-ray adequate for interpretation during COVID 19 epidemic. RADIOPHARMACEUTICALS:  4.3 mCi Tc-62m MAA IV COMPARISON:  None Correlation: Chest radiograph 06/23/2020 FINDINGS: Enlargement of cardiac silhouette and pulmonary hila. No wedge-shaped segmental or subsegmental perfusion defects are identified to suggest pulmonary embolism. IMPRESSION: Cardiomegaly and slightly prominent pulmonary hila. No scintigraphic evidence of pulmonary embolism. Electronically Signed   By: Lavonia [name] M.D.   On: 06/23/2020 15:39   CARDIAC CATHETERIZATION  Result Date: 06/23/2020 1. Elevated filling pressures, RV failure out of proportion to left-sided failure. 2. PAPI 1.7 suggesting RV dysfunction. 3. Preserved cardiac output. 4. Moderate pulmonary venous hypertension. Will admit patient for close monitoring of creatinine with IV diuresis.     Medications:   . sodium chloride    . sodium chloride     . allopurinol  300 mg Oral Daily  . amiodarone  100 mg Oral Daily  . carvedilol  12.5 mg Oral BID WC  . furosemide  80 mg  Intravenous BID  . gabapentin  300 mg Oral BID  . heparin  5,000 Units Subcutaneous Q8H  . isosorbide-hydrALAZINE  1 tablet Oral TID  . levothyroxine  137 mcg Oral Q0600  . metolazone  2.5 mg Oral Once  . pantoprazole  40 mg Oral Daily  . sodium chloride flush  3 mL Intravenous Q12H  . traZODone  50 mg Oral QHS   sodium chloride, sodium chloride, acetaminophen, ondansetron (ZOFRAN) IV, sodium chloride flush, traMADol  Assessment/ Plan:   Chronic renal insufficiency stage IV.  Appears to be some mild progression of her disease.  This is probably secondary to cardiorenal syndrome does not appear to be any evidence of nephrotoxin use.  She is not taking an ACE inhibitor or ARB.  There has been no use of nonsteroidal anti-inflammatory drugs.  She has not received any IV contrast.  She did undergo right-sided heart catheterization on 06/23/2020.  Continue daily renal panel, close monitoring of I's and O's and avoidance of nephrotoxins  ANEMIA-history of GI bleed will need close follow-up.  There appears to be a contraindication for the use of any anticoagulation in this lady at this time. Iron stores would indicate iron deficiency will therefore give IV iron.  MBD-PTH 68 06/23/2020  HTN/VOL-agree with the use of IV diuretics.   Will try to optimize her loop diuretic dose and will convert her to 160 mg every 8 hours to see if we can obtain a good diuretic response. Metolazone has been added 2.5 mg  by cardiology  Congestive heart failure with biventricular pacing.  Postop some diastolic dysfunction on last 2D echo 02/24/2020  Hypothyroidism on replacement therapy  Nonischemic cardiomyopathy negative Cardiolite study 12/15  Hypertension appears to be stable  Diabetes mellitus diet controlled  Atrial fibrillation not a candidate for anticoagulation secondary to bleeding.   LOS: 1 Sherril Croon @TODAY @9 :05 AM

## 2020-06-25 ENCOUNTER — Encounter: Payer: Medicare Other | Admitting: *Deleted

## 2020-06-25 ENCOUNTER — Encounter (HOSPITAL_COMMUNITY): Payer: Medicare Other

## 2020-06-25 DIAGNOSIS — I5033 Acute on chronic diastolic (congestive) heart failure: Secondary | ICD-10-CM | POA: Diagnosis not present

## 2020-06-25 DIAGNOSIS — Z006 Encounter for examination for normal comparison and control in clinical research program: Secondary | ICD-10-CM

## 2020-06-25 DIAGNOSIS — I50810 Right heart failure, unspecified: Secondary | ICD-10-CM | POA: Diagnosis not present

## 2020-06-25 LAB — RENAL FUNCTION PANEL
Albumin: 3.7 g/dL (ref 3.5–5.0)
Anion gap: 11 (ref 5–15)
BUN: 34 mg/dL — ABNORMAL HIGH (ref 8–23)
CO2: 23 mmol/L (ref 22–32)
Calcium: 9.1 mg/dL (ref 8.9–10.3)
Chloride: 98 mmol/L (ref 98–111)
Creatinine, Ser: 3.07 mg/dL — ABNORMAL HIGH (ref 0.44–1.00)
GFR, Estimated: 16 mL/min — ABNORMAL LOW (ref >60)
Glucose, Bld: 105 mg/dL — ABNORMAL HIGH (ref 70–99)
Phosphorus: 3.5 mg/dL (ref 2.5–4.6)
Potassium: 3.6 mmol/L (ref 3.5–5.1)
Sodium: 132 mmol/L — ABNORMAL LOW (ref 135–145)

## 2020-06-25 LAB — CBC
HCT: 25.1 % — ABNORMAL LOW (ref 36.0–46.0)
Hemoglobin: 8.2 g/dL — ABNORMAL LOW (ref 12.0–15.0)
MCH: 28.3 pg (ref 26.0–34.0)
MCHC: 32.7 g/dL (ref 30.0–36.0)
MCV: 86.6 fL (ref 80.0–100.0)
Platelets: 175 10*3/uL (ref 150–400)
RBC: 2.9 MIL/uL — ABNORMAL LOW (ref 3.87–5.11)
RDW: 18.2 % — ABNORMAL HIGH (ref 11.5–15.5)
WBC: 5.2 10*3/uL (ref 4.0–10.5)
nRBC: 0 % (ref 0.0–0.2)

## 2020-06-25 MED ORDER — METOLAZONE 5 MG PO TABS
5.0000 mg | ORAL_TABLET | Freq: Two times a day (BID) | ORAL | Status: DC
  Administered 2020-06-25 – 2020-06-28 (×7): 5 mg via ORAL
  Filled 2020-06-25 (×7): qty 1

## 2020-06-25 MED ORDER — ALLOPURINOL 100 MG PO TABS
100.0000 mg | ORAL_TABLET | Freq: Every day | ORAL | Status: DC
  Administered 2020-06-26 – 2020-07-12 (×17): 100 mg via ORAL
  Filled 2020-06-25 (×18): qty 1

## 2020-06-25 NOTE — Progress Notes (Signed)
[name] ROUNDING NOTE   Subjective:   Brief history:75 year old lady with history of nonischemic cardiomyopathy, she underwent Medtronic CRT-D biventricular pacer with improved ejection fraction last 2D echo showing ejection fraction of 60-65%.  She had a Cardiolite study done that showed no evidence of any ischemia.  She has had problems with GI bleeding and has been on Eliquis for atrial flutter.  She developed a gastric ulcer and noted to have small multiple lesions on AVMs on capsule endoscopy.  She had a right-sided heart catheterization 12/27/2019 with nonobstructive coronary artery disease.  She is undergoing right sided cardiac catheterization 06/23/2020 without the use of IV contrast she has a history of diabetes is diet controlled, history of gout, history of atrial flutter, history of hepatitis C treated with Harvoni, history of small bowel obstruction and history of GI bleed.  Chronic kidney disease is being followed by [name] since she last saw [name] ( who is since retired). Her most recent hospitalization 05/27/2020 was anemia and GI bleed status post colonoscopy and 2 units packed red blood cells with discontinuation of Eliquis.  Interval history: No significant improvement in volume status today despite aggressive diuretics.  Urine output adequate but not robust.  Patient continues to feel okay but knows that she has fluid on.  Limiting intake       Objective:  Vital signs in last 24 hours:  Temp:  [97.3 F (36.3 C)-98.7 F (37.1 C)] 98 F (36.7 C) (02/18 0723) Pulse Rate:  [60-67] 61 (02/18 0723) Resp:  [16-21] 17 (02/18 0723) BP: (114-147)/(51-73) 132/55 (02/18 0723) SpO2:  [97 %-100 %] 100 % (02/18 0723) Weight:  [61.7 kg] 61.7 kg (02/18 0356)  Weight change: 0.181 kg Filed Weights   06/23/20 1200 06/24/20 0317 06/25/20 0356  Weight: 61.6 kg 61.9 kg 61.7 kg    Intake/Output: I/O last 3 completed shifts: In: 720  [P.O.:720] Out: 1500 [Urine:1500]   Intake/Output this shift:  No intake/output data recorded.  GEN: wdwn, sitting in bed, nad ENT: no nasal discharge, mmm EYES: no scleral icterus, eomi CV: normal rate, no murmurs PULM: no iwob, bilateral chest rise ABD: NABS, mild distention SKIN: no rashes or jaundice EXT: no edema, warm and well perfused    Basic Metabolic Panel: Recent Labs  Lab 06/18/20 1331 06/21/20 1501 06/23/20 0931 06/23/20 1224 06/24/20 0247 06/25/20 0333  NA 141 137 144  144 140 137 132*  K 4.1 4.2 3.7  3.6 4.1 3.5 3.6  CL 106 100  --  105 101 98  CO2 25 23  --  22 24 23   GLUCOSE 125* 101*  --  109* 86 105*  BUN 32* 38*  --  38* 36* 34*  CREATININE 2.54* 3.25*  --  2.92* 3.04* 3.07*  CALCIUM 9.5 9.2  --  9.6 9.0 9.1  MG  --  2.4  --   --   --   --   PHOS  --   --   --   --  4.1 3.5    Liver Function Tests: Recent Labs  Lab 06/18/20 1331 06/23/20 1224 06/24/20 0247 06/25/20 0333  AST 18 19  --   --   ALT 12 11  --   --   ALKPHOS 82 88  --   --   BILITOT 1.0 0.5  --   --   PROT 7.7 8.3*  --   --   ALBUMIN 3.9 4.2 3.6 3.7   No results for  input(s): LIPASE, AMYLASE in the last 168 hours. No results for input(s): AMMONIA in the last 168 hours.  CBC: Recent Labs  Lab 06/18/20 1331 06/23/20 0931 06/23/20 1224 06/24/20 0247 06/25/20 0333  WBC 6.2  --  5.6 4.8 5.2  NEUTROABS  --   --  4.0  --   --   HGB 8.9* 9.9*  9.5* 9.9* 8.5* 8.2*  HCT 28.1* 29.0*  28.0* 31.0* 25.7* 25.1*  MCV 90.1  --  89.3 87.4 86.6  PLT 229  --  215 203 175    Cardiac Enzymes: No results for input(s): CKTOTAL, CKMB, CKMBINDEX, TROPONINI in the last 168 hours.  BNP: Invalid input(s): POCBNP  CBG: Recent Labs  Lab 06/23/20 1601 06/23/20 2124 06/24/20 0602 06/24/20 1111 06/24/20 1632  GLUCAP 167* 91 91 115* 88    Microbiology: Results for orders placed or performed during the hospital encounter of 06/22/20  SARS CORONAVIRUS 2 (TAT 6-24 HRS)  Nasopharyngeal Nasopharyngeal Swab     Status: None   Collection Time: 06/22/20 11:19 AM   Specimen: Nasopharyngeal Swab  Result Value Ref Range Status   SARS Coronavirus 2 NEGATIVE NEGATIVE Final    Comment: (NOTE) SARS-CoV-2 target nucleic acids are NOT DETECTED.  The SARS-CoV-2 RNA is generally detectable in upper and lower respiratory specimens during the acute phase of infection. Negative results do not preclude SARS-CoV-2 infection, do not rule out co-infections with other pathogens, and should not be used as the sole basis for treatment or other patient management decisions. Negative results must be combined with clinical observations, patient history, and epidemiological information. The expected result is Negative.  Fact Sheet for Patients: SugarRoll.be  Fact Sheet for Healthcare Providers: https://www.woods-mathews.com/  This test is not yet approved or cleared by the [place] FDA and  has been authorized for detection and/or diagnosis of SARS-CoV-2 by FDA under an Emergency Use Authorization (EUA). This EUA will remain  in effect (meaning this test can be used) for the duration of the COVID-19 declaration under Se ction 564(b)(1) of the Act, 21 U.S.C. section 360bbb-3(b)(1), unless the authorization is terminated or revoked sooner.  Performed at [facility], [place] [address] [place], [place] [address]    *Note: Due to a large number of results and/or encounters for the requested time period, some results have not been displayed. A complete set of results can be found in Results Review.    Coagulation Studies: No results for input(s): LABPROT, INR in the last 72 hours.  Urinalysis: No results for input(s): COLORURINE, LABSPEC, PHURINE, GLUCOSEU, HGBUR, BILIRUBINUR, KETONESUR, PROTEINUR, UROBILINOGEN, NITRITE, LEUKOCYTESUR in the last 72 hours.  Invalid input(s): APPERANCEUR    Imaging: DG Chest 2  View  Result Date: 06/23/2020 CLINICAL DATA:  CHF. EXAM: CHEST - 2 VIEW COMPARISON:  05/27/2020 FINDINGS: The cardio pericardial silhouette is enlarged. Diffuse airspace disease noted right lower lung and mild retrocardiac left base collapse/consolidation evident. Left pacer/AICD noted. Telemetry leads overlie the chest. IMPRESSION: Bilateral airspace disease with basilar predominance is progressive in the interval. This may be related to pulmonary edema or infection. Electronically Signed   By: Misty Stanley M.D.   On: 06/23/2020 14:53   NM Pulmonary Perfusion  Result Date: 06/23/2020 CLINICAL DATA:  Shortness of breath for couple weeks, elevated D-dimer, low to intermediate clinical probability for pulmonary embolism EXAM: NUCLEAR MEDICINE PERFUSION LUNG SCAN TECHNIQUE: Perfusion images were obtained in multiple projections after intravenous injection of radiopharmaceutical. Ventilation scans intentionally deferred if perfusion scan and chest  x-ray adequate for interpretation during COVID 19 epidemic. RADIOPHARMACEUTICALS:  4.3 mCi Tc-46m MAA IV COMPARISON:  None Correlation: Chest radiograph 06/23/2020 FINDINGS: Enlargement of cardiac silhouette and pulmonary hila. No wedge-shaped segmental or subsegmental perfusion defects are identified to suggest pulmonary embolism. IMPRESSION: Cardiomegaly and slightly prominent pulmonary hila. No scintigraphic evidence of pulmonary embolism. Electronically Signed   By: Lavonia [name] M.D.   On: 06/23/2020 15:39   CARDIAC CATHETERIZATION  Result Date: 06/23/2020 1. Elevated filling pressures, RV failure out of proportion to left-sided failure. 2. PAPI 1.7 suggesting RV dysfunction. 3. Preserved cardiac output. 4. Moderate pulmonary venous hypertension. Will admit patient for close monitoring of creatinine with IV diuresis.   ECHOCARDIOGRAM COMPLETE  Result Date: 06/24/2020    ECHOCARDIOGRAM REPORT   Patient Name:   Morgan Clay Date of Exam: 06/24/2020 Medical  Rec #:  [id number]         Height:       59.0 in Accession #:    [id number]        Weight:       136.5 lb Date of Birth:  Sep 10, 1945         BSA:          1.568 m Patient Age:    38 years          BP:           144/70 mmHg Patient Gender: F                 HR:           65 bpm. Exam Location:  Inpatient Procedure: 2D Echo Indications:    Congestive Heart Failure  History:        Patient has prior history of Echocardiogram examinations, most                 recent 12/29/2019. Arrythmias:Atrial Fibrillation; Risk                 Factors:Dyslipidemia and Diabetes.  Sonographer:    Mikki Santee RDCS (AE) Referring Phys: [place]  1. Left ventricular ejection fraction, by estimation, is 55 to 60%. The left ventricle has normal function. The left ventricle has no regional wall motion abnormalities. There is moderate concentric left ventricular hypertrophy. Left ventricular diastolic parameters are consistent with Grade II diastolic dysfunction (pseudonormalization). Elevated left ventricular end-diastolic pressure. There is the interventricular septum is flattened in systole and diastole, consistent with right ventricular pressure and volume overload.  2. Right ventricular systolic function is low normal. The right ventricular size is moderately enlarged. There is mildly elevated pulmonary artery systolic pressure.  3. Left atrial size was severely dilated.  4. Right atrial size was severely dilated.  5. The mitral valve is grossly normal. Mild mitral valve regurgitation. No evidence of mitral stenosis.  6. Tricuspid valve regurgitation is severe.  7. The aortic valve is tricuspid. Aortic valve regurgitation is moderate. No aortic stenosis is present.  8. The inferior vena cava is dilated in size with <50% respiratory variability, suggesting right atrial pressure of 15 mmHg. Comparison(s): Changes from prior study are noted. TR appears severe. FINDINGS  Left Ventricle: Left ventricular ejection  fraction, by estimation, is 55 to 60%. The left ventricle has normal function. The left ventricle has no regional wall motion abnormalities. The left ventricular internal cavity size was normal in size. There is  moderate concentric left ventricular hypertrophy. The interventricular septum is flattened in systole and diastole, consistent with  right ventricular pressure and volume overload. Left ventricular diastolic parameters are consistent with Grade II diastolic dysfunction (pseudonormalization). Elevated left ventricular end-diastolic pressure. Right Ventricle: The right ventricular size is moderately enlarged. Right vetricular wall thickness was not well visualized. Right ventricular systolic function is low normal. There is mildly elevated pulmonary artery systolic pressure. The tricuspid regurgitant velocity is 2.39 m/s, and with an assumed right atrial pressure of 15 mmHg, the estimated right ventricular systolic pressure is 26.7 mmHg. Left Atrium: Left atrial size was severely dilated. Right Atrium: Right atrial size was severely dilated. Pericardium: There is no evidence of pericardial effusion. Mitral Valve: The mitral valve is grossly normal. Mild mitral valve regurgitation. No evidence of mitral valve stenosis. Tricuspid Valve: The tricuspid valve is normal in structure. Tricuspid valve regurgitation is severe. No evidence of tricuspid stenosis. Aortic Valve: The aortic valve is tricuspid. Aortic valve regurgitation is moderate. Aortic regurgitation PHT measures 432 msec. No aortic stenosis is present. Pulmonic Valve: The pulmonic valve was grossly normal. Pulmonic valve regurgitation is mild. No evidence of pulmonic stenosis. Aorta: The aortic root and ascending aorta are structurally normal, with no evidence of dilitation and the aortic arch was not well visualized. Venous: The inferior vena cava is dilated in size with less than 50% respiratory variability, suggesting right atrial pressure of 15  mmHg. IAS/Shunts: The atrial septum is grossly normal. Additional Comments: A pacer wire is visualized in the right atrium and right ventricle.  LEFT VENTRICLE PLAX 2D LVIDd:         4.40 cm  Diastology LVIDs:         2.80 cm  LV e' medial:    5.11 cm/s LV PW:         1.20 cm  LV E/e' medial:  19.2 LV IVS:        1.20 cm  LV e' lateral:   6.49 cm/s LVOT diam:     2.00 cm  LV E/e' lateral: 15.1 LV SV:         56 LV SV Index:   36 LVOT Area:     3.14 cm  RIGHT VENTRICLE TAPSE (M-mode): 1.6 cm LEFT ATRIUM           Index       RIGHT ATRIUM           Index LA diam:      4.70 cm 3.00 cm/m  RA Area:     23.40 cm LA Vol (A2C): 80.2 ml 51.15 ml/m RA Volume:   72.50 ml  46.24 ml/m LA Vol (A4C): 70.6 ml 45.03 ml/m  AORTIC VALVE LVOT Vmax:         75.40 cm/s LVOT Vmean:        50.900 cm/s LVOT VTI:          0.179 m AI PHT:            432 msec AR Vena Contracta: 0.40 cm  AORTA Ao Root diam: 2.40 cm MITRAL VALVE               TRICUSPID VALVE MV Area (PHT): 3.17 cm    TR Peak grad:   22.8 mmHg MV Decel Time: 239 msec    TR Vmax:        239.00 cm/s MV E velocity: 98.30 cm/s MV A velocity: 83.90 cm/s  SHUNTS MV E/A ratio:  1.17        Systemic VTI:  0.18 m  Systemic Diam: 2.00 cm Buford Dresser MD Electronically signed by Buford Dresser MD Signature Date/Time: 06/24/2020/6:20:01 PM    Final      Medications:   . sodium chloride    . sodium chloride    . [START ON 06/27/2020] ferumoxytol    . furosemide 160 mg (06/25/20 0016)   . allopurinol  300 mg Oral Daily  . amiodarone  100 mg Oral Daily  . carvedilol  12.5 mg Oral BID WC  . gabapentin  300 mg Oral BID  . heparin  5,000 Units Subcutaneous Q8H  . isosorbide-hydrALAZINE  1 tablet Oral TID  . levothyroxine  137 mcg Oral Q0600  . metolazone  5 mg Oral BID  . pantoprazole  40 mg Oral Daily  . sodium chloride flush  3 mL Intravenous Q12H  . traZODone  50 mg Oral QHS   sodium chloride, sodium chloride, acetaminophen,  ondansetron (ZOFRAN) IV, sodium chloride flush, traMADol  Assessment/ Plan:   CKD 4: Mild progression over time.  Baseline around 2.5 with creatinine 3.1 here.  Likely cardiorenal syndrome attempting diuresis as below.  Continue to monitor ins and outs   CHF: RHC on 2/16 with elevated filling pressures, RV failure and moderate pulmonary venous hypertension.  Refractory to diuretics at this time  Continue IV Lasix 160 mg 3 times daily and increase metolazone to 5 mg twice daily  May need to consider Diuril tomorrow if she does not respond  In rare circumstances of diuretic refractory volume overload a bolus of hypertonic saline could be considered (http://www.torres.com/).  Otherwise she may require dialysis for volume removal.  Would like to avoid this if at all possible given her otherwise reassuring clinical picture  ANEMIA of iron deficiency and CKD-history of GI bleed will need close follow-up.    Status post IV iron  HTN: Management as below.  Continue current blood pressure medications  Hypothyroidism on replacement therapy  Nonischemic cardiomyopathy negative Cardiolite study 12/15  Diabetes mellitus diet controlled  Atrial fibrillation not a candidate for anticoagulation secondary to bleeding.   LOS: 2 Reesa Chew @TODAY @8 :55 AM

## 2020-06-25 NOTE — Research (Signed)
AT [facility] Cardiopulmonary Examination & Physical Examination Summary  Day 7 Visit  Subject [id number] February 18th, 2022   Cardiopulmonary Exam  Was the CP Exam Performed? [x]  Yes (Complete Below) []  No (Complete Protocol Deviation)      Heart Sounds  S3: [x]  Absent []  Present   S4: [x]  Absent []  Present  Murmur  Murmur: []  Yes [x]  No   If Yes:  []  Systolic []  Diastolic   If yes select grade: []  I/VI []  II/VI []  III/VI []  IV/VI []  V/VI []  VI/VI   Chest Sounds  Rales: []  Yes [x]  No   If yes check one: []  ? 1/3 of lung fields full []  1/2 of lung fields full []  > 1/2 of lung fields full  If yes check one: []  Left []  Right []  Bilateral    Edema   Edema (pedal): [x]  Absent []  Trace []  1+ (slight) []  2+ (moderate) []  3+ (severe)    Hepatomegaly  Hepatomegaly: [x]  None []  < 2 cm []  2-4 cm []  > 4 cm  Peripheral Pulses  Peripheral Pulses: [x]  Normal []  Abnormal   If Abnormal, explain:             Physical Exam   Was the physical Exam Performed?  [x]  Yes        []  No (complete protocol deviation)    . Lungs/Chest?     [x]  Normal        []  Abnormal        []  Not Done  If abnormal, was it clinically significant?     []  Yes   []  No       . Dermatologic (Abdominal Area):   [x]  Normal        []  Abnormal        []  Not Done   If abnormal, was it clinically significant?  []  Yes   []  No       . Periphery:     [x]  Normal          []  Abnormal         []  Not Done   If abnormal, was it clinically significant?  []  Yes    []  No      . Other Body System Examined?  []  Yes        [x]  No          New York Heart Association (NYHA) Heart Failure Classification Indicate the subject's current NYHA Classification at the present time (check only one) Was the NYHA Performed:    [x]  Yes    [] No (Complete Protocol Deviation)     []   Class I:  Patients have cardiac disease but without the resulting limitations of  physical activity. Ordinary physical activity does not cause undue fatigue, palpitation, dyspnea or anginal pain.     []   Class II:    Patients have cardiac disease resulting in slight limitation of physical activity. They are comfortable at rest. Ordinary physical activity results in fatigue, palpitation, dyspnea or anginal pain.     [x]   Class III:  Patients have cardiac disease resulting in marked limitation of physical activity. They are comfortable at rest. Less than ordinary physical activity causes fatigue, palpitation, dyspnea or anginal pain.     []   Class IV: Patients have cardiac disease resulting in inability to carry on any physical activity without discomfort. Symptoms of cardiac insufficiency or of the anginal syndrome may be present even at rest. If any physical activity  is undertaken, discomfort is increased.

## 2020-06-25 NOTE — Progress Notes (Signed)
Patient ID: Morgan Clay, female   DOB: 02-18-1946, 75 y.o.   MRN: [id number]     Advanced Heart Failure Rounding Note  PCP-Cardiologist: No primary care provider on file.   Subjective:    Still short of breath with exertion.  Lasix increased to 160 mg IV every 8 hrs yesterday, not much response.  Weight unchanged.  Creatinine has remained stable at 3.07 also. SBP 120s-130s generally.   Echo: EF 55-60%, moderate LVH, D-shaped interventricular septum, RV moderately dilated with low normal function, severe biatrial enlargement, severe TR.   RHC Procedural Findings (2/17): Hemodynamics (mmHg) RA mean 21 RV 49/20 PA 59/24, mean 39 PCWP mean 26 Oxygen saturations: PA 64% AO 98% Cardiac Output (Fick) 5.07  Cardiac Index (Fick) 3.23 PVR 2.6 WU Cardiac Output (Thermo) 4.72  Cardiac Index (Thermo) 3.01  PVR 2.75 WU PAPI 1.7  Objective:   Weight Range: 61.7 kg Body mass index is 27.49 kg/m.   Vital Signs:   Temp:  [97.3 F (36.3 C)-98.7 F (37.1 C)] 98 F (36.7 C) (02/18 0723) Pulse Rate:  [60-67] 61 (02/18 0723) Resp:  [16-21] 17 (02/18 0723) BP: (114-147)/(51-73) 132/55 (02/18 0723) SpO2:  [97 %-100 %] 100 % (02/18 0723) Weight:  [61.7 kg] 61.7 kg (02/18 0356) Last BM Date: 06/24/20  Weight change: Filed Weights   06/23/20 1200 06/24/20 0317 06/25/20 0356  Weight: 61.6 kg 61.9 kg 61.7 kg    Intake/Output:   Intake/Output Summary (Last 24 hours) at 06/25/2020 0846 Last data filed at 06/25/2020 0400 Gross per 24 hour  Intake 600 ml  Output 1300 ml  Net -700 ml      Physical Exam    General: NAD Neck: JVP 16+ cm, no thyromegaly or thyroid nodule.  Lungs: Clear to auscultation bilaterally with normal respiratory effort. CV: Nondisplaced PMI.  Heart regular S1/S2, no S3/S4, 2/6 HSM LLSB.  No peripheral edema.   Abdomen: Soft, nontender, no hepatosplenomegaly, no distention.  Skin: Intact without lesions or rashes.  Neurologic: Alert and oriented x 3.   Psych: Normal affect. Extremities: No clubbing or cyanosis.  HEENT: Normal.    Telemetry   NSR, v-paced in 60s (personally reviewed)  EKG    No new EKG to review today   Labs    CBC Recent Labs    06/23/20 1224 06/24/20 0247 06/25/20 0333  WBC 5.6 4.8 5.2  NEUTROABS 4.0  --   --   HGB 9.9* 8.5* 8.2*  HCT 31.0* 25.7* 25.1*  MCV 89.3 87.4 86.6  PLT 215 203 627   Basic Metabolic Panel Recent Labs    06/24/20 0247 06/25/20 0333  NA 137 132*  K 3.5 3.6  CL 101 98  CO2 24 23  GLUCOSE 86 105*  BUN 36* 34*  CREATININE 3.04* 3.07*  CALCIUM 9.0 9.1  PHOS 4.1 3.5   Liver Function Tests Recent Labs    06/23/20 1224 06/24/20 0247 06/25/20 0333  AST 19  --   --   ALT 11  --   --   ALKPHOS 88  --   --   BILITOT 0.5  --   --   PROT 8.3*  --   --   ALBUMIN 4.2 3.6 3.7   No results for input(s): LIPASE, AMYLASE in the last 72 hours. Cardiac Enzymes No results for input(s): CKTOTAL, CKMB, CKMBINDEX, TROPONINI in the last 72 hours.  BNP: BNP (last 3 results) Recent Labs    06/18/20 1300 06/21/20 1501 06/23/20 1224  BNP 1,147.8*  981.7* 1,099.2*    ProBNP (last 3 results) Recent Labs    07/17/19 1352 07/23/19 1033  PROBNP 811.0* 637.0*     D-Dimer No results for input(s): DDIMER in the last 72 hours. Hemoglobin A1C No results for input(s): HGBA1C in the last 72 hours. Fasting Lipid Panel No results for input(s): CHOL, HDL, LDLCALC, TRIG, CHOLHDL, LDLDIRECT in the last 72 hours. Thyroid Function Tests Recent Labs    06/23/20 1224  TSH 3.588  3.392    Other results:   Imaging    ECHOCARDIOGRAM COMPLETE  Result Date: 06/24/2020    ECHOCARDIOGRAM REPORT   Patient Name:   Morgan Clay Date of Exam: 06/24/2020 Medical Rec #:  [id number]         Height:       59.0 in Accession #:    [id number]        Weight:       136.5 lb Date of Birth:  1946/03/13         BSA:          1.568 m Patient Age:    25 years          BP:           144/70 mmHg  Patient Gender: F                 HR:           65 bpm. Exam Location:  Inpatient Procedure: 2D Echo Indications:    Congestive Heart Failure  History:        Patient has prior history of Echocardiogram examinations, most                 recent 12/29/2019. Arrythmias:Atrial Fibrillation; Risk                 Factors:Dyslipidemia and Diabetes.  Sonographer:    Mikki Santee RDCS (AE) Referring Phys: [place]  1. Left ventricular ejection fraction, by estimation, is 55 to 60%. The left ventricle has normal function. The left ventricle has no regional wall motion abnormalities. There is moderate concentric left ventricular hypertrophy. Left ventricular diastolic parameters are consistent with Grade II diastolic dysfunction (pseudonormalization). Elevated left ventricular end-diastolic pressure. There is the interventricular septum is flattened in systole and diastole, consistent with right ventricular pressure and volume overload.  2. Right ventricular systolic function is low normal. The right ventricular size is moderately enlarged. There is mildly elevated pulmonary artery systolic pressure.  3. Left atrial size was severely dilated.  4. Right atrial size was severely dilated.  5. The mitral valve is grossly normal. Mild mitral valve regurgitation. No evidence of mitral stenosis.  6. Tricuspid valve regurgitation is severe.  7. The aortic valve is tricuspid. Aortic valve regurgitation is moderate. No aortic stenosis is present.  8. The inferior vena cava is dilated in size with <50% respiratory variability, suggesting right atrial pressure of 15 mmHg. Comparison(s): Changes from prior study are noted. TR appears severe. FINDINGS  Left Ventricle: Left ventricular ejection fraction, by estimation, is 55 to 60%. The left ventricle has normal function. The left ventricle has no regional wall motion abnormalities. The left ventricular internal cavity size was normal in size. There is  moderate  concentric left ventricular hypertrophy. The interventricular septum is flattened in systole and diastole, consistent with right ventricular pressure and volume overload. Left ventricular diastolic parameters are consistent with Grade II diastolic dysfunction (pseudonormalization). Elevated left ventricular end-diastolic pressure. Right Ventricle:  The right ventricular size is moderately enlarged. Right vetricular wall thickness was not well visualized. Right ventricular systolic function is low normal. There is mildly elevated pulmonary artery systolic pressure. The tricuspid regurgitant velocity is 2.39 m/s, and with an assumed right atrial pressure of 15 mmHg, the estimated right ventricular systolic pressure is 02.4 mmHg. Left Atrium: Left atrial size was severely dilated. Right Atrium: Right atrial size was severely dilated. Pericardium: There is no evidence of pericardial effusion. Mitral Valve: The mitral valve is grossly normal. Mild mitral valve regurgitation. No evidence of mitral valve stenosis. Tricuspid Valve: The tricuspid valve is normal in structure. Tricuspid valve regurgitation is severe. No evidence of tricuspid stenosis. Aortic Valve: The aortic valve is tricuspid. Aortic valve regurgitation is moderate. Aortic regurgitation PHT measures 432 msec. No aortic stenosis is present. Pulmonic Valve: The pulmonic valve was grossly normal. Pulmonic valve regurgitation is mild. No evidence of pulmonic stenosis. Aorta: The aortic root and ascending aorta are structurally normal, with no evidence of dilitation and the aortic arch was not well visualized. Venous: The inferior vena cava is dilated in size with less than 50% respiratory variability, suggesting right atrial pressure of 15 mmHg. IAS/Shunts: The atrial septum is grossly normal. Additional Comments: A pacer wire is visualized in the right atrium and right ventricle.  LEFT VENTRICLE PLAX 2D LVIDd:         4.40 cm  Diastology LVIDs:         2.80 cm   LV e' medial:    5.11 cm/s LV PW:         1.20 cm  LV E/e' medial:  19.2 LV IVS:        1.20 cm  LV e' lateral:   6.49 cm/s LVOT diam:     2.00 cm  LV E/e' lateral: 15.1 LV SV:         56 LV SV Index:   36 LVOT Area:     3.14 cm  RIGHT VENTRICLE TAPSE (M-mode): 1.6 cm LEFT ATRIUM           Index       RIGHT ATRIUM           Index LA diam:      4.70 cm 3.00 cm/m  RA Area:     23.40 cm LA Vol (A2C): 80.2 ml 51.15 ml/m RA Volume:   72.50 ml  46.24 ml/m LA Vol (A4C): 70.6 ml 45.03 ml/m  AORTIC VALVE LVOT Vmax:         75.40 cm/s LVOT Vmean:        50.900 cm/s LVOT VTI:          0.179 m AI PHT:            432 msec AR Vena Contracta: 0.40 cm  AORTA Ao Root diam: 2.40 cm MITRAL VALVE               TRICUSPID VALVE MV Area (PHT): 3.17 cm    TR Peak grad:   22.8 mmHg MV Decel Time: 239 msec    TR Vmax:        239.00 cm/s MV E velocity: 98.30 cm/s MV A velocity: 83.90 cm/s  SHUNTS MV E/A ratio:  1.17        Systemic VTI:  0.18 m                            Systemic Diam: 2.00 cm [employer]  MD Electronically signed by Buford Dresser MD Signature Date/Time: 06/24/2020/6:20:01 PM    Final      Medications:     Scheduled Medications: . allopurinol  300 mg Oral Daily  . amiodarone  100 mg Oral Daily  . carvedilol  12.5 mg Oral BID WC  . gabapentin  300 mg Oral BID  . heparin  5,000 Units Subcutaneous Q8H  . isosorbide-hydrALAZINE  1 tablet Oral TID  . levothyroxine  137 mcg Oral Q0600  . metolazone  5 mg Oral BID  . pantoprazole  40 mg Oral Daily  . sodium chloride flush  3 mL Intravenous Q12H  . traZODone  50 mg Oral QHS    Infusions: . sodium chloride    . sodium chloride    . [START ON 06/27/2020] ferumoxytol    . furosemide 160 mg (06/25/20 0016)    PRN Medications: sodium chloride, sodium chloride, acetaminophen, ondansetron (ZOFRAN) IV, sodium chloride flush, traMADol    Assessment/Plan   1. Chronic primarily diastolic CHF with prominent RV dysfunction: Patient has  history of presumed nonischemic cardiomyopathy, EF as low at 25-30% in 12/15.  [employer] CRT-D device placed with improvement in LV function, echo in 4/19 showed EF improved to 60-65%. PYP scan in 3/20 was equivocal for transthyretin amyloidosis, PYP scan in 1/22 was not suggestive of transthyretin amyloidosis. LHC/RHC in 8/21 showed nonobstructive CAD, normal filling pressures and cardiac output. Echo 8/21 showed EF 50-55%, mildly decreased RV systolic function, moderate AI. NYHA class IIIb symptoms with progressive volume overload and renal failure recently.  [place] 2/16 showed elevated R>L heart filling pressures with low PAPI at 1.7 and preserved cardiac output. V/Q scan not suggestive of chronic PEs.  Decision made to admit for diuresis and monitoring of renal function. Repeat echo in hospital showed EF 55-60%, moderate LVH, D-shaped interventricular septum, RV moderately dilated with low normal function, severe biatrial enlargement, severe TR => confirming prominent RV failure.  Patient remains volume overloaded on exam today with JVP 16+.  Creatinine remained stable at 3.07 but she did not diurese well on high dose Lasix. Renal following.  - Agree with continue Lasix 160 mg IV every 8 hrs and addition of metolazone 5 mg bid today.  - Continue Bidil 1 tab tid.  - Continue Coreg 12.5 mg bid.  2. Atrial flutter/fibrillation: History of atypical flutter and atrial fibrillation.Lately has been in NSR on amiodarone 100 mg daily. She is off apixaban with recurrent severe GI bleeding.  - Continue amiodarone. TSH and LFTs ok.  - Has been referred to Dr. Quentin Ore for evaluation for Watchman device. 3. CKD stage IV: Followed by nephrology as outpatient.  Creatinine stable at 3.07 but having a hard time diuresing her. BP stable.  - Adjust diuretics as above.  - No current indication for HD, but concerned about our ability to effectively diurese her. HD will be difficult with RV failure.  - avoid  hypotension  4. Anemia: Recurrent severe GI bleeding. She is now off Eliquis. Hgb 8.2 today. Transferrin saturation 5% at admission.  - Received Feraheme 2/16  - As above, Watchman evaluation eventually. 5. Hypertension:Continue current meds.  - avoid hypotension w/ AKI   Length of Stay: 2  Loralie Champagne, MD  06/25/2020, 8:46 AM  Advanced Heart Failure Team Pager [phone] (M-F; 7a - 4p)  Please contact [facility] for night-coverage after hours (4p -7a ) and weekends on [email or web address]

## 2020-06-25 NOTE — Research (Signed)
        February 18th, 2022 Subject 16-009 Day 7 Visit     Composite Congestion Score (CCS) Indicate the subject's current CCS Score at the present time (select for each signs/symptoms) Was CCS Performed:     [x] Yes  [] No (Complete Protocol Deviation)     Signs/Symptoms 0 - None  1 -Seldom 2 -Frequent 3 -Continuous  Dyspnoea []  [x]  []  []   Orthopnoea [x]  []  []  []   Fatigue []  [x]  []  []    Signs/Symptoms 0 - <=6  1 - 6-9 2 - 10-15 3 - >=15  JVD (cm H2O) []  []  []  [x]    Signs/Symptoms 0 -None  1 -Bases 2 -To <50% 3 -To >50%  Rales [x]  []  []  []    Signs/Symptoms 0 -Absent/ trace  1 -Slight 2 -Moderate 3 -Marked  Oedema [x]  []  []  []

## 2020-06-25 NOTE — Research (Signed)
  [employer] Protocol [id number] AT [facility]  Patient Global Assessment Visual Analog Scale (EQ-VAS)        Subject Number:   16-009  Date Completed:        February 18th, 2022  Study Day:   Day 7       . We would like to know how good or bad your health is TODAY. [name] This scale is numbered 0 to 100. . 100 means the best health you can imagine.       0 means the worst health you can imagine. . Please let me know the number you feel indicated how your health is TODAY.    VAS SCORE - YOUR HEALTH TODAY : 24

## 2020-06-25 NOTE — Progress Notes (Signed)
  Mobility Specialist Criteria Algorithm Info.   Mobility Team: HOB elevated: Activity: Ambulated in hall; Dangled on edge of bed Range of motion: Active; All extremities Level of assistance: Standby assist, set-up cues, supervision of patient - no hands on Assistive device: Front wheel walker Minutes sitting in chair:  Minutes stood: 5 minutes Minutes ambulated: 5 minutes Distance ambulated (ft): 300 ft Mobility response: Tolerated well Bed Position: Semi-fowlers  Received pt lying supine in bed, not eager but willing to participate in mobility. Patient tolerated ambulation better today. Ambulated in hallway 300 feet at min guard with RW, per pt her SOB has improved significantly. Patient appeared to be feeling better and is less fatigued than prior sessions.   06/25/2020 09:11 AM

## 2020-06-25 NOTE — Research (Signed)
The subject is currently hospitalized for worsening heart failure and monitoring kidney function. Was informed by CRA to try and perform as much as we could for the subject's Day 7 visit, which was supposed to be today. We were able to do most of their visit and the remainder of the assessments that weren't able to be completed, a protocol deviation has been reported.

## 2020-06-26 DIAGNOSIS — I5033 Acute on chronic diastolic (congestive) heart failure: Secondary | ICD-10-CM | POA: Diagnosis not present

## 2020-06-26 LAB — RENAL FUNCTION PANEL
Albumin: 3.8 g/dL (ref 3.5–5.0)
Anion gap: 16 — ABNORMAL HIGH (ref 5–15)
BUN: 38 mg/dL — ABNORMAL HIGH (ref 8–23)
CO2: 24 mmol/L (ref 22–32)
Calcium: 9.1 mg/dL (ref 8.9–10.3)
Chloride: 90 mmol/L — ABNORMAL LOW (ref 98–111)
Creatinine, Ser: 3.29 mg/dL — ABNORMAL HIGH (ref 0.44–1.00)
GFR, Estimated: 14 mL/min — ABNORMAL LOW (ref >60)
Glucose, Bld: 86 mg/dL (ref 70–99)
Phosphorus: 3.1 mg/dL (ref 2.5–4.6)
Potassium: 3.8 mmol/L (ref 3.5–5.1)
Sodium: 130 mmol/L — ABNORMAL LOW (ref 135–145)

## 2020-06-26 LAB — CBC
HCT: 26.8 % — ABNORMAL LOW (ref 36.0–46.0)
Hemoglobin: 8.8 g/dL — ABNORMAL LOW (ref 12.0–15.0)
MCH: 28.1 pg (ref 26.0–34.0)
MCHC: 32.8 g/dL (ref 30.0–36.0)
MCV: 85.6 fL (ref 80.0–100.0)
Platelets: 187 10*3/uL (ref 150–400)
RBC: 3.13 MIL/uL — ABNORMAL LOW (ref 3.87–5.11)
RDW: 18.6 % — ABNORMAL HIGH (ref 11.5–15.5)
WBC: 5.8 10*3/uL (ref 4.0–10.5)
nRBC: 0.5 % — ABNORMAL HIGH (ref 0.0–0.2)

## 2020-06-26 NOTE — Progress Notes (Signed)
[facility] ROUNDING NOTE   Subjective:   Brief history:75 year old lady with history of nonischemic cardiomyopathy, she underwent Medtronic CRT-D biventricular pacer with improved ejection fraction last 2D echo showing ejection fraction of 60-65%.  She had a Cardiolite study done that showed no evidence of any ischemia.  She has had problems with GI bleeding and has been on Eliquis for atrial flutter.  She developed a gastric ulcer and noted to have small multiple lesions on AVMs on capsule endoscopy.  She had a right-sided heart catheterization 12/27/2019 with nonobstructive coronary artery disease.  She is undergoing right sided cardiac catheterization 06/23/2020 without the use of IV contrast she has a history of diabetes is diet controlled, history of gout, history of atrial flutter, history of hepatitis C treated with Harvoni, history of small bowel obstruction and history of GI bleed.  Chronic kidney disease is being followed by [name] since she last saw [name] ( who is since retired). Her most recent hospitalization 05/27/2020 was anemia and GI bleed status post colonoscopy and 2 units packed red blood cells with discontinuation of Eliquis.  Interval history: Fairly good response to diuretic regimen yesterday.  Patient is feeling better with less abdominal distention.       Objective:  Vital signs in last 24 hours:  Temp:  [97.9 F (36.6 C)-99.5 F (37.5 C)] 98 F (36.7 C) (02/19 0758) Pulse Rate:  [60-99] 65 (02/19 0758) Resp:  [16-20] 20 (02/19 0758) BP: (100-122)/(47-64) 122/64 (02/19 0758) SpO2:  [98 %-100 %] 98 % (02/19 0758) Weight:  [60.5 kg] 60.5 kg (02/19 0433)  Weight change: -1.225 kg Filed Weights   06/24/20 0317 06/25/20 0356 06/26/20 0433  Weight: 61.9 kg 61.7 kg 60.5 kg    Intake/Output: I/O last 3 completed shifts: In: 1311.3 [P.O.:1140; I.V.:3; IV Piggyback:168.3] Out: 4100 [Urine:4100]   Intake/Output this shift:   Total I/O In: 360 [P.O.:360] Out: 300 [Urine:300]  GEN: wdwn, sitting in bed, nad ENT: no nasal discharge, mmm EYES: no scleral icterus, eomi CV: normal rate, no murmurs PULM: no iwob, bilateral chest rise ABD: NABS, mild distention SKIN: no rashes or jaundice EXT: no edema, warm and well perfused    Basic Metabolic Panel: Recent Labs  Lab 06/21/20 1501 06/23/20 0931 06/23/20 1224 06/24/20 0247 06/25/20 0333 06/26/20 0508  NA 137 144  144 140 137 132* 130*  K 4.2 3.7  3.6 4.1 3.5 3.6 3.8  CL 100  --  105 101 98 90*  CO2 23  --  22 24 23 24   GLUCOSE 101*  --  109* 86 105* 86  BUN 38*  --  38* 36* 34* 38*  CREATININE 3.25*  --  2.92* 3.04* 3.07* 3.29*  CALCIUM 9.2  --  9.6 9.0 9.1 9.1  MG 2.4  --   --   --   --   --   PHOS  --   --   --  4.1 3.5 3.1    Liver Function Tests: Recent Labs  Lab 06/23/20 1224 06/24/20 0247 06/25/20 0333 06/26/20 0508  AST 19  --   --   --   ALT 11  --   --   --   ALKPHOS 88  --   --   --   BILITOT 0.5  --   --   --   PROT 8.3*  --   --   --   ALBUMIN 4.2 3.6 3.7 3.8   No results for input(s): LIPASE,  AMYLASE in the last 168 hours. No results for input(s): AMMONIA in the last 168 hours.  CBC: Recent Labs  Lab 06/23/20 0931 06/23/20 1224 06/24/20 0247 06/25/20 0333 06/26/20 0508  WBC  --  5.6 4.8 5.2 5.8  NEUTROABS  --  4.0  --   --   --   HGB 9.9*  9.5* 9.9* 8.5* 8.2* 8.8*  HCT 29.0*  28.0* 31.0* 25.7* 25.1* 26.8*  MCV  --  89.3 87.4 86.6 85.6  PLT  --  215 203 175 187    Cardiac Enzymes: No results for input(s): CKTOTAL, CKMB, CKMBINDEX, TROPONINI in the last 168 hours.  BNP: Invalid input(s): POCBNP  CBG: Recent Labs  Lab 06/23/20 1601 06/23/20 2124 06/24/20 0602 06/24/20 1111 06/24/20 1632  GLUCAP 167* 91 91 115* 88    Microbiology: Results for orders placed or performed during the hospital encounter of 06/22/20  SARS CORONAVIRUS 2 (TAT 6-24 HRS) Nasopharyngeal Nasopharyngeal Swab     Status: None    Collection Time: 06/22/20 11:19 AM   Specimen: Nasopharyngeal Swab  Result Value Ref Range Status   SARS Coronavirus 2 NEGATIVE NEGATIVE Final    Comment: (NOTE) SARS-CoV-2 target nucleic acids are NOT DETECTED.  The SARS-CoV-2 RNA is generally detectable in upper and lower respiratory specimens during the acute phase of infection. Negative results do not preclude SARS-CoV-2 infection, do not rule out co-infections with other pathogens, and should not be used as the sole basis for treatment or other patient management decisions. Negative results must be combined with clinical observations, patient history, and epidemiological information. The expected result is Negative.  Fact Sheet for Patients: SugarRoll.be  Fact Sheet for Healthcare Providers: https://www.woods-mathews.com/  This test is not yet approved or cleared by the [place] FDA and  has been authorized for detection and/or diagnosis of SARS-CoV-2 by FDA under an Emergency Use Authorization (EUA). This EUA will remain  in effect (meaning this test can be used) for the duration of the COVID-19 declaration under Se ction 564(b)(1) of the Act, 21 U.S.C. section 360bbb-3(b)(1), unless the authorization is terminated or revoked sooner.  Performed at [facility], [place] [address] [place], [place] [address]    *Note: Due to a large number of results and/or encounters for the requested time period, some results have not been displayed. A complete set of results can be found in Results Review.    Coagulation Studies: No results for input(s): LABPROT, INR in the last 72 hours.  Urinalysis: No results for input(s): COLORURINE, LABSPEC, PHURINE, GLUCOSEU, HGBUR, BILIRUBINUR, KETONESUR, PROTEINUR, UROBILINOGEN, NITRITE, LEUKOCYTESUR in the last 72 hours.  Invalid input(s): APPERANCEUR    Imaging: ECHOCARDIOGRAM COMPLETE  Result Date: 06/24/2020    ECHOCARDIOGRAM REPORT    Patient Name:   MERIAL MORITZ Date of Exam: 06/24/2020 Medical Rec #:  [id number]         Height:       59.0 in Accession #:    [id number]        Weight:       136.5 lb Date of Birth:  Jun 07, 1945         BSA:          1.568 m Patient Age:    75 years          BP:           144/70 mmHg Patient Gender: F                 HR:  65 bpm. Exam Location:  Inpatient Procedure: 2D Echo Indications:    Congestive Heart Failure  History:        Patient has prior history of Echocardiogram examinations, most                 recent 12/29/2019. Arrythmias:Atrial Fibrillation; Risk                 Factors:Dyslipidemia and Diabetes.  Sonographer:    Mikki Santee RDCS (AE) Referring Phys: [place]  1. Left ventricular ejection fraction, by estimation, is 55 to 60%. The left ventricle has normal function. The left ventricle has no regional wall motion abnormalities. There is moderate concentric left ventricular hypertrophy. Left ventricular diastolic parameters are consistent with Grade II diastolic dysfunction (pseudonormalization). Elevated left ventricular end-diastolic pressure. There is the interventricular septum is flattened in systole and diastole, consistent with right ventricular pressure and volume overload.  2. Right ventricular systolic function is low normal. The right ventricular size is moderately enlarged. There is mildly elevated pulmonary artery systolic pressure.  3. Left atrial size was severely dilated.  4. Right atrial size was severely dilated.  5. The mitral valve is grossly normal. Mild mitral valve regurgitation. No evidence of mitral stenosis.  6. Tricuspid valve regurgitation is severe.  7. The aortic valve is tricuspid. Aortic valve regurgitation is moderate. No aortic stenosis is present.  8. The inferior vena cava is dilated in size with <50% respiratory variability, suggesting right atrial pressure of 15 mmHg. Comparison(s): Changes from prior study are noted. TR  appears severe. FINDINGS  Left Ventricle: Left ventricular ejection fraction, by estimation, is 55 to 60%. The left ventricle has normal function. The left ventricle has no regional wall motion abnormalities. The left ventricular internal cavity size was normal in size. There is  moderate concentric left ventricular hypertrophy. The interventricular septum is flattened in systole and diastole, consistent with right ventricular pressure and volume overload. Left ventricular diastolic parameters are consistent with Grade II diastolic dysfunction (pseudonormalization). Elevated left ventricular end-diastolic pressure. Right Ventricle: The right ventricular size is moderately enlarged. Right vetricular wall thickness was not well visualized. Right ventricular systolic function is low normal. There is mildly elevated pulmonary artery systolic pressure. The tricuspid regurgitant velocity is 2.39 m/s, and with an assumed right atrial pressure of 15 mmHg, the estimated right ventricular systolic pressure is 29.5 mmHg. Left Atrium: Left atrial size was severely dilated. Right Atrium: Right atrial size was severely dilated. Pericardium: There is no evidence of pericardial effusion. Mitral Valve: The mitral valve is grossly normal. Mild mitral valve regurgitation. No evidence of mitral valve stenosis. Tricuspid Valve: The tricuspid valve is normal in structure. Tricuspid valve regurgitation is severe. No evidence of tricuspid stenosis. Aortic Valve: The aortic valve is tricuspid. Aortic valve regurgitation is moderate. Aortic regurgitation PHT measures 432 msec. No aortic stenosis is present. Pulmonic Valve: The pulmonic valve was grossly normal. Pulmonic valve regurgitation is mild. No evidence of pulmonic stenosis. Aorta: The aortic root and ascending aorta are structurally normal, with no evidence of dilitation and the aortic arch was not well visualized. Venous: The inferior vena cava is dilated in size with less than 50%  respiratory variability, suggesting right atrial pressure of 15 mmHg. IAS/Shunts: The atrial septum is grossly normal. Additional Comments: A pacer wire is visualized in the right atrium and right ventricle.  LEFT VENTRICLE PLAX 2D LVIDd:         4.40 cm  Diastology LVIDs:  2.80 cm  LV e' medial:    5.11 cm/s LV PW:         1.20 cm  LV E/e' medial:  19.2 LV IVS:        1.20 cm  LV e' lateral:   6.49 cm/s LVOT diam:     2.00 cm  LV E/e' lateral: 15.1 LV SV:         56 LV SV Index:   36 LVOT Area:     3.14 cm  RIGHT VENTRICLE TAPSE (M-mode): 1.6 cm LEFT ATRIUM           Index       RIGHT ATRIUM           Index LA diam:      4.70 cm 3.00 cm/m  RA Area:     23.40 cm LA Vol (A2C): 80.2 ml 51.15 ml/m RA Volume:   72.50 ml  46.24 ml/m LA Vol (A4C): 70.6 ml 45.03 ml/m  AORTIC VALVE LVOT Vmax:         75.40 cm/s LVOT Vmean:        50.900 cm/s LVOT VTI:          0.179 m AI PHT:            432 msec AR Vena Contracta: 0.40 cm  AORTA Ao Root diam: 2.40 cm MITRAL VALVE               TRICUSPID VALVE MV Area (PHT): 3.17 cm    TR Peak grad:   22.8 mmHg MV Decel Time: 239 msec    TR Vmax:        239.00 cm/s MV E velocity: 98.30 cm/s MV A velocity: 83.90 cm/s  SHUNTS MV E/A ratio:  1.17        Systemic VTI:  0.18 m                            Systemic Diam: 2.00 cm Buford Dresser MD Electronically signed by Buford Dresser MD Signature Date/Time: 06/24/2020/6:20:01 PM    Final      Medications:   . sodium chloride    . sodium chloride    . [START ON 06/27/2020] ferumoxytol    . furosemide 160 mg (06/26/20 0020)   . allopurinol  100 mg Oral Daily  . amiodarone  100 mg Oral Daily  . carvedilol  12.5 mg Oral BID WC  . gabapentin  300 mg Oral BID  . heparin  5,000 Units Subcutaneous Q8H  . isosorbide-hydrALAZINE  1 tablet Oral TID  . levothyroxine  137 mcg Oral Q0600  . metolazone  5 mg Oral BID  . pantoprazole  40 mg Oral Daily  . sodium chloride flush  3 mL Intravenous Q12H  . traZODone  50  mg Oral QHS   sodium chloride, sodium chloride, acetaminophen, ondansetron (ZOFRAN) IV, sodium chloride flush, traMADol  Assessment/ Plan:   CKD 4: Mild progression over time.  Baseline around 2.5 with creatinine around 3-year.  Likely cardiorenal syndrome attempting diuresis as below.  Slight rise in creatinine with diuresis.  However, needs continuing diuresis.  Continue to monitor ins and outs and creatinine daily  CHF: RHC on 2/16 with elevated filling pressures, RV failure and moderate pulmonary venous hypertension.  Refractory to diuretics at this time  Continue IV Lasix 160 mg 3 times daily and metolazone 5 mg twice daily  Fortunately he is responding to therapy at this  time  Continue monitor creatinine daily  ANEMIA of iron deficiency and CKD-history of GI bleed will need close follow-up.    Status post IV iron  HTN: Management as below.  Continue current blood pressure medications  Hypothyroidism on replacement therapy  Nonischemic cardiomyopathy negative Cardiolite study 12/15  Diabetes mellitus diet controlled  Atrial fibrillation not a candidate for anticoagulation secondary to bleeding.   LOS: 3 Reesa Chew @TODAY @8 :55 AM

## 2020-06-26 NOTE — Progress Notes (Signed)
Patient ID: Morgan Clay, female   DOB: 01-12-1946, 75 y.o.   MRN: [id number]     Advanced Heart Failure Rounding Note  PCP-Cardiologist: No primary care provider on file.   Subjective:    Better diuresis yesterday, weight down.  On Lasix 160 mg IV every 8 hrs + metolazone 5 mg bid.  Creatinine mildly higher 3.07 => 3.29. I/Os net negative 1489.    Echo: EF 55-60%, moderate LVH, D-shaped interventricular septum, RV moderately dilated with low normal function, severe biatrial enlargement, severe TR.   RHC Procedural Findings (2/17): Hemodynamics (mmHg) RA mean 21 RV 49/20 PA 59/24, mean 39 PCWP mean 26 Oxygen saturations: PA 64% AO 98% Cardiac Output (Fick) 5.07  Cardiac Index (Fick) 3.23 PVR 2.6 WU Cardiac Output (Thermo) 4.72  Cardiac Index (Thermo) 3.01  PVR 2.75 WU PAPI 1.7  Objective:   Weight Range: 60.5 kg Body mass index is 26.94 kg/m.   Vital Signs:   Temp:  [97.9 F (36.6 C)-99.5 F (37.5 C)] 98 F (36.7 C) (02/19 0758) Pulse Rate:  [60-99] 65 (02/19 0758) Resp:  [16-20] 20 (02/19 0758) BP: (100-122)/(47-64) 122/64 (02/19 0758) SpO2:  [98 %-100 %] 98 % (02/19 0758) Weight:  [60.5 kg] 60.5 kg (02/19 0433) Last BM Date: 06/25/20  Weight change: Filed Weights   06/24/20 0317 06/25/20 0356 06/26/20 0433  Weight: 61.9 kg 61.7 kg 60.5 kg    Intake/Output:   Intake/Output Summary (Last 24 hours) at 06/26/2020 0945 Last data filed at 06/26/2020 0914 Gross per 24 hour  Intake 1491.25 ml  Output 2700 ml  Net -1208.75 ml      Physical Exam    General: NAD Neck: JVP 14-16 cm, no thyromegaly or thyroid nodule.  Lungs: Clear to auscultation bilaterally with normal respiratory effort. CV: Nondisplaced PMI.  Heart regular S1/S2, no S3/S4, 2/6 HSM LLSB.  No peripheral edema.   Abdomen: Soft, nontender, no hepatosplenomegaly, no distention.  Skin: Intact without lesions or rashes.  Neurologic: Alert and oriented x 3.  Psych: Normal  affect. Extremities: No clubbing or cyanosis.  HEENT: Normal.    Telemetry   NSR, v-paced in 60s (personally reviewed)  EKG    No new EKG to review today   Labs    CBC Recent Labs    06/23/20 1224 06/24/20 0247 06/25/20 0333 06/26/20 0508  WBC 5.6   < > 5.2 5.8  NEUTROABS 4.0  --   --   --   HGB 9.9*   < > 8.2* 8.8*  HCT 31.0*   < > 25.1* 26.8*  MCV 89.3   < > 86.6 85.6  PLT 215   < > 175 187   < > = values in this interval not displayed.   Basic Metabolic Panel Recent Labs    06/25/20 0333 06/26/20 0508  NA 132* 130*  K 3.6 3.8  CL 98 90*  CO2 23 24  GLUCOSE 105* 86  BUN 34* 38*  CREATININE 3.07* 3.29*  CALCIUM 9.1 9.1  PHOS 3.5 3.1   Liver Function Tests Recent Labs    06/23/20 1224 06/24/20 0247 06/25/20 0333 06/26/20 0508  AST 19  --   --   --   ALT 11  --   --   --   ALKPHOS 88  --   --   --   BILITOT 0.5  --   --   --   PROT 8.3*  --   --   --   ALBUMIN 4.2   < >  3.7 3.8   < > = values in this interval not displayed.   No results for input(s): LIPASE, AMYLASE in the last 72 hours. Cardiac Enzymes No results for input(s): CKTOTAL, CKMB, CKMBINDEX, TROPONINI in the last 72 hours.  BNP: BNP (last 3 results) Recent Labs    06/18/20 1300 06/21/20 1501 06/23/20 1224  BNP 1,147.8* 981.7* 1,099.2*    ProBNP (last 3 results) Recent Labs    07/17/19 1352 07/23/19 1033  PROBNP 811.0* 637.0*     D-Dimer No results for input(s): DDIMER in the last 72 hours. Hemoglobin A1C No results for input(s): HGBA1C in the last 72 hours. Fasting Lipid Panel No results for input(s): CHOL, HDL, LDLCALC, TRIG, CHOLHDL, LDLDIRECT in the last 72 hours. Thyroid Function Tests Recent Labs    06/23/20 1224  TSH 3.588  3.392    Other results:   Imaging    No results found.   Medications:     Scheduled Medications: . allopurinol  100 mg Oral Daily  . amiodarone  100 mg Oral Daily  . carvedilol  12.5 mg Oral BID WC  . gabapentin  300  mg Oral BID  . heparin  5,000 Units Subcutaneous Q8H  . isosorbide-hydrALAZINE  1 tablet Oral TID  . levothyroxine  137 mcg Oral Q0600  . metolazone  5 mg Oral BID  . pantoprazole  40 mg Oral Daily  . sodium chloride flush  3 mL Intravenous Q12H  . traZODone  50 mg Oral QHS    Infusions: . sodium chloride    . sodium chloride    . [START ON 06/27/2020] ferumoxytol    . furosemide 160 mg (06/26/20 0902)    PRN Medications: sodium chloride, sodium chloride, acetaminophen, ondansetron (ZOFRAN) IV, sodium chloride flush, traMADol    Assessment/Plan   1. Chronic primarily diastolic CHF with prominent RV dysfunction: Patient has history of presumed nonischemic cardiomyopathy, EF as low at 25-30% in 12/15.  Medtronic CRT-D device placed with improvement in LV function, echo in 4/19 showed EF improved to 60-65%. PYP scan in 3/20 was equivocal for transthyretin amyloidosis, PYP scan in 1/22 was not suggestive of transthyretin amyloidosis. LHC/RHC in 8/21 showed nonobstructive CAD, normal filling pressures and cardiac output. Echo 8/21 showed EF 50-55%, mildly decreased RV systolic function, moderate AI. NYHA class IIIb symptoms with progressive volume overload and renal failure recently.  [place] 2/16 showed elevated R>L heart filling pressures with low PAPI at 1.7 and preserved cardiac output. V/Q scan not suggestive of chronic PEs.  Decision made to admit for diuresis and monitoring of renal function. Repeat echo in hospital showed EF 55-60%, moderate LVH, D-shaped interventricular septum, RV moderately dilated with low normal function, severe biatrial enlargement, severe TR => confirming prominent RV failure.  Patient remains volume overloaded on exam today with JVP elevated but she is finally diuresing on high dose diuretics.  Creatinine mildly higher at 3.29.  - Agree with continuing Lasix 160 mg IV every 8 hrs and metolazone 5 mg bid today.  - Continue Bidil 1 tab tid.  - Continue Coreg 12.5  mg bid.  2. Atrial flutter/fibrillation: History of atypical flutter and atrial fibrillation.Lately has been in NSR on amiodarone 100 mg daily. She is off apixaban with recurrent severe GI bleeding.  - Continue amiodarone.  - Has been referred to Dr. Quentin Ore for evaluation for Watchman device. 3. CKD stage IV: Followed by nephrology as outpatient. Creatinine mildly higher at 3.29 this morning but she has finally diuresed some. BP  stable.  - Continue diuretics as above.  - No current indication for HD, but concerned about our ability to effectively diurese her long-term. HD will be difficult with RV failure.  - avoid hypotension  - Nephrology following.  4. Anemia: Recurrent severe GI bleeding. She is now off Eliquis. Hgb 8.8 today. Transferrin saturation 5% at admission.  - Received Feraheme 2/16  - As above, Watchman evaluation eventually. 5. Hypertension:Continue current meds.  - avoid hypotension w/ AKI   Length of Stay: 3  Loralie Champagne, MD  06/26/2020, 9:45 AM  Advanced Heart Failure Team Pager [phone] (M-F; [place])  Please contact [facility] for night-coverage after hours (4p -7a ) and weekends on [email or web address]

## 2020-06-27 DIAGNOSIS — I5033 Acute on chronic diastolic (congestive) heart failure: Secondary | ICD-10-CM | POA: Diagnosis not present

## 2020-06-27 LAB — RENAL FUNCTION PANEL
Albumin: 3.8 g/dL (ref 3.5–5.0)
Anion gap: 17 — ABNORMAL HIGH (ref 5–15)
BUN: 44 mg/dL — ABNORMAL HIGH (ref 8–23)
CO2: 24 mmol/L (ref 22–32)
Calcium: 9.4 mg/dL (ref 8.9–10.3)
Chloride: 88 mmol/L — ABNORMAL LOW (ref 98–111)
Creatinine, Ser: 3.24 mg/dL — ABNORMAL HIGH (ref 0.44–1.00)
GFR, Estimated: 15 mL/min — ABNORMAL LOW (ref >60)
Glucose, Bld: 96 mg/dL (ref 70–99)
Phosphorus: 3.2 mg/dL (ref 2.5–4.6)
Potassium: 3.3 mmol/L — ABNORMAL LOW (ref 3.5–5.1)
Sodium: 129 mmol/L — ABNORMAL LOW (ref 135–145)

## 2020-06-27 LAB — CBC
HCT: 27.3 % — ABNORMAL LOW (ref 36.0–46.0)
Hemoglobin: 9 g/dL — ABNORMAL LOW (ref 12.0–15.0)
MCH: 28 pg (ref 26.0–34.0)
MCHC: 33 g/dL (ref 30.0–36.0)
MCV: 85 fL (ref 80.0–100.0)
Platelets: 188 10*3/uL (ref 150–400)
RBC: 3.21 MIL/uL — ABNORMAL LOW (ref 3.87–5.11)
RDW: 18.4 % — ABNORMAL HIGH (ref 11.5–15.5)
WBC: 6.9 10*3/uL (ref 4.0–10.5)
nRBC: 0.3 % — ABNORMAL HIGH (ref 0.0–0.2)

## 2020-06-27 MED ORDER — POTASSIUM CHLORIDE ER 10 MEQ PO TBCR
40.0000 meq | EXTENDED_RELEASE_TABLET | Freq: Once | ORAL | Status: AC
  Administered 2020-06-27: 40 meq via ORAL
  Filled 2020-06-27 (×2): qty 4

## 2020-06-27 MED ORDER — ISOSORB DINITRATE-HYDRALAZINE 20-37.5 MG PO TABS
0.5000 | ORAL_TABLET | Freq: Three times a day (TID) | ORAL | Status: DC
  Administered 2020-06-27 – 2020-07-12 (×41): 0.5 via ORAL
  Filled 2020-06-27 (×41): qty 1

## 2020-06-27 NOTE — Progress Notes (Signed)
Patient ID: Morgan Clay, female   DOB: 07/08/1945, 75 y.o.   MRN: [id number]     Advanced Heart Failure Rounding Note  PCP-Cardiologist: No primary care provider on file.   Subjective:    Some diuresis but not as vigorous.  Weight down 1 lb.  On Lasix 160 mg IV every 8 hrs + metolazone 5 mg bid.  Creatinine stable 3.07 => 3.29 => 3.24.    She feels better, less short of breath with exertion.   Echo: EF 55-60%, moderate LVH, D-shaped interventricular septum, RV moderately dilated with low normal function, severe biatrial enlargement, severe TR.   RHC Procedural Findings (2/17): Hemodynamics (mmHg) RA mean 21 RV 49/20 PA 59/24, mean 39 PCWP mean 26 Oxygen saturations: PA 64% AO 98% Cardiac Output (Fick) 5.07  Cardiac Index (Fick) 3.23 PVR 2.6 WU Cardiac Output (Thermo) 4.72  Cardiac Index (Thermo) 3.01  PVR 2.75 WU PAPI 1.7  Objective:   Weight Range: 60 kg Body mass index is 26.72 kg/m.   Vital Signs:   Temp:  [98.3 F (36.8 C)-99.6 F (37.6 C)] 98.3 F (36.8 C) (02/20 0746) Pulse Rate:  [60-96] 62 (02/20 0927) Resp:  [18-20] 18 (02/20 0746) BP: (102-128)/(36-62) 119/36 (02/20 0927) SpO2:  [95 %-100 %] 100 % (02/20 0746) Weight:  [60 kg] 60 kg (02/20 0500) Last BM Date: 06/25/20  Weight change: Filed Weights   06/25/20 0356 06/26/20 0433 06/27/20 0500  Weight: 61.7 kg 60.5 kg 60 kg    Intake/Output:   Intake/Output Summary (Last 24 hours) at 06/27/2020 1011 Last data filed at 06/27/2020 0828 Gross per 24 hour  Intake 857.76 ml  Output 1500 ml  Net -642.24 ml      Physical Exam    General: NAD Neck: JVP 14 cm, no thyromegaly or thyroid nodule.  Lungs: Clear to auscultation bilaterally with normal respiratory effort. CV: Nondisplaced PMI.  Heart regular S1/S2, no S3/S4, 3/6 HSM LLSB.  No peripheral edema.   Abdomen: Soft, nontender, no hepatosplenomegaly, no distention.  Skin: Intact without lesions or rashes.  Neurologic: Alert and oriented x  3.  Psych: Normal affect. Extremities: No clubbing or cyanosis.  HEENT: Normal.    Telemetry   NSR, v-paced in 60s (personally reviewed)  EKG    No new EKG to review today   Labs    CBC Recent Labs    06/26/20 0508 06/27/20 0403  WBC 5.8 6.9  HGB 8.8* 9.0*  HCT 26.8* 27.3*  MCV 85.6 85.0  PLT 187 481   Basic Metabolic Panel Recent Labs    06/26/20 0508 06/27/20 0403  NA 130* 129*  K 3.8 3.3*  CL 90* 88*  CO2 24 24  GLUCOSE 86 96  BUN 38* 44*  CREATININE 3.29* 3.24*  CALCIUM 9.1 9.4  PHOS 3.1 3.2   Liver Function Tests Recent Labs    06/26/20 0508 06/27/20 0403  ALBUMIN 3.8 3.8   No results for input(s): LIPASE, AMYLASE in the last 72 hours. Cardiac Enzymes No results for input(s): CKTOTAL, CKMB, CKMBINDEX, TROPONINI in the last 72 hours.  BNP: BNP (last 3 results) Recent Labs    06/18/20 1300 06/21/20 1501 06/23/20 1224  BNP 1,147.8* 981.7* 1,099.2*    ProBNP (last 3 results) Recent Labs    07/17/19 1352 07/23/19 1033  PROBNP 811.0* 637.0*     D-Dimer No results for input(s): DDIMER in the last 72 hours. Hemoglobin A1C No results for input(s): HGBA1C in the last 72 hours. Fasting Lipid Panel No  results for input(s): CHOL, HDL, LDLCALC, TRIG, CHOLHDL, LDLDIRECT in the last 72 hours. Thyroid Function Tests No results for input(s): TSH, T4TOTAL, T3FREE, THYROIDAB in the last 72 hours.  Invalid input(s): FREET3  Other results:   Imaging    No results found.   Medications:     Scheduled Medications: . allopurinol  100 mg Oral Daily  . amiodarone  100 mg Oral Daily  . carvedilol  12.5 mg Oral BID WC  . gabapentin  300 mg Oral BID  . heparin  5,000 Units Subcutaneous Q8H  . isosorbide-hydrALAZINE  1 tablet Oral TID  . levothyroxine  137 mcg Oral Q0600  . metolazone  5 mg Oral BID  . pantoprazole  40 mg Oral Daily  . sodium chloride flush  3 mL Intravenous Q12H  . traZODone  50 mg Oral QHS    Infusions: . sodium  chloride    . sodium chloride    . ferumoxytol    . furosemide 160 mg (06/27/20 0929)    PRN Medications: sodium chloride, sodium chloride, acetaminophen, ondansetron (ZOFRAN) IV, sodium chloride flush, traMADol    Assessment/Plan   1. Chronic primarily diastolic CHF with prominent RV dysfunction: Patient has history of presumed nonischemic cardiomyopathy, EF as low at 25-30% in 12/15.  Medtronic CRT-D device placed with improvement in LV function, echo in 4/19 showed EF improved to 60-65%. PYP scan in 3/20 was equivocal for transthyretin amyloidosis, PYP scan in 1/22 was not suggestive of transthyretin amyloidosis. LHC/RHC in 8/21 showed nonobstructive CAD, normal filling pressures and cardiac output. Echo 8/21 showed EF 50-55%, mildly decreased RV systolic function, moderate AI. NYHA class IIIb symptoms with progressive volume overload and renal failure recently.  Lower [place] 2/16 showed elevated R>L heart filling pressures with low PAPI at 1.7 and preserved cardiac output. V/Q scan not suggestive of chronic PEs.  Decision made to admit for diuresis and monitoring of renal function. Repeat echo in hospital showed EF 55-60%, moderate LVH, D-shaped interventricular septum, RV moderately dilated with low normal function, severe biatrial enlargement, severe TR => confirming prominent RV failure.  Patient remains volume overloaded on exam today with JVP elevated, slow diuresis.  She is feeling better.  JVP is going to be difficult to follow due to severe TR.  Creatinine is stable.  - Agree with continuing Lasix 160 mg IV every 8 hrs and metolazone 5 mg bid today, would probably aim for trying to switch over to a po regimen tomorrow.  - BP on the lower side, decrease Bidil to 1/2 tab tid.  - Continue Coreg 12.5 mg bid.  2. Atrial flutter/fibrillation: History of atypical flutter and atrial fibrillation.Lately has been in NSR on amiodarone 100 mg daily. She is off apixaban with recurrent severe GI  bleeding.  - Continue amiodarone.  - Has been referred to Dr. Quentin Ore for evaluation for Watchman device. 3. CKD stage IV: Followed by nephrology as outpatient. Creatinine mildly higher at 3.29 this morning but she has finally diuresed some. BP stable.  - Continue diuretics as above.  - No current indication for HD, but concerned about our ability to effectively diurese her long-term. HD will be difficult with RV failure.  - avoid hypotension  - Nephrology following.  4. Anemia: Recurrent severe GI bleeding. She is now off Eliquis. Hgb 8.8 today. Transferrin saturation 5% at admission.  - Received Feraheme 2/16  - As above, Watchman evaluation eventually. 5. Hypertension:BP lower as above.  - avoid hypotension w/ AKI, will decrease Bidil to  1/2 tab tid.   Length of Stay: [place], MD  06/27/2020, 10:11 AM  Advanced Heart Failure Team Pager [phone] (M-F; 7a - 4p)  Please contact [facility] for night-coverage after hours (4p -7a ) and weekends on [email or web address]

## 2020-06-27 NOTE — Progress Notes (Signed)
Pt was concerned about a small lump around where her IV was in her L FA, pt states this was not here before admission to the hospital, please address, thanks Arvella Nigh RN.

## 2020-06-27 NOTE — Plan of Care (Signed)
  Problem: Pain Managment: Goal: General experience of comfort will improve Outcome: Completed/Met   Problem: Coping: Goal: Level of anxiety will decrease Outcome: Completed/Met   Problem: Nutrition: Goal: Adequate nutrition will be maintained Outcome: Completed/Met   

## 2020-06-27 NOTE — Progress Notes (Signed)
[facility] ROUNDING NOTE   Subjective:   Brief history:75 year old lady with history of nonischemic cardiomyopathy, she underwent Medtronic CRT-D biventricular pacer with improved ejection fraction last 2D echo showing ejection fraction of 60-65%.  She had a Cardiolite study done that showed no evidence of any ischemia.  She has had problems with GI bleeding and has been on Eliquis for atrial flutter.  She developed a gastric ulcer and noted to have small multiple lesions on AVMs on capsule endoscopy.  She had a right-sided heart catheterization 12/27/2019 with nonobstructive coronary artery disease.  She is undergoing right sided cardiac catheterization 06/23/2020 without the use of IV contrast she has a history of diabetes is diet controlled, history of gout, history of atrial flutter, history of hepatitis C treated with Harvoni, history of small bowel obstruction and history of GI bleed.  Chronic kidney disease is being followed by [name] since she last saw [name] ( who is since retired). Her most recent hospitalization 05/27/2020 was anemia and GI bleed status post colonoscopy and 2 units packed red blood cells with discontinuation of Eliquis.  Interval history: Use the same diuretic regimen yesterday with not as good of a response.  Patient noted that she did not urinate quite as much as she did the day before.  Weight did improve the.  Continues to be a little volume overloaded.       Objective:  Vital signs in last 24 hours:  Temp:  [98.3 F (36.8 C)-99.6 F (37.6 C)] 98.3 F (36.8 C) (02/20 0746) Pulse Rate:  [60-96] 96 (02/20 0746) Resp:  [18-20] 18 (02/20 0746) BP: (102-128)/(41-62) 122/50 (02/20 0746) SpO2:  [95 %-100 %] 100 % (02/20 0746) Weight:  [60 kg] 60 kg (02/20 0500)  Weight change: -0.499 kg Filed Weights   06/25/20 0356 06/26/20 0433 06/27/20 0500  Weight: 61.7 kg 60.5 kg 60 kg    Intake/Output: I/O last 3 completed  shifts: In: 1340.8 [P.O.:1176; I.V.:3; IV Piggyback:161.8] Out: 3000 [Urine:3000]   Intake/Output this shift:  Total I/O In: 360 [P.O.:360] Out: 300 [Urine:300]  GEN: wdwn, sitting in bed, nad ENT: no nasal discharge, mmm EYES: no scleral icterus, eomi CV: normal rate, no murmurs PULM: no iwob, bilateral chest rise ABD: NABS, mild distention SKIN: no rashes or jaundice EXT: no edema, warm and well perfused    Basic Metabolic Panel: Recent Labs  Lab 06/21/20 1501 06/23/20 0931 06/23/20 1224 06/24/20 0247 06/25/20 0333 06/26/20 0508 06/27/20 0403  NA 137   < > 140 137 132* 130* 129*  K 4.2   < > 4.1 3.5 3.6 3.8 3.3*  CL 100  --  105 101 98 90* 88*  CO2 23  --  22 24 23 24 24   GLUCOSE 101*  --  109* 86 105* 86 96  BUN 38*  --  38* 36* 34* 38* 44*  CREATININE 3.25*  --  2.92* 3.04* 3.07* 3.29* 3.24*  CALCIUM 9.2  --  9.6 9.0 9.1 9.1 9.4  MG 2.4  --   --   --   --   --   --   PHOS  --   --   --  4.1 3.5 3.1 3.2   < > = values in this interval not displayed.    Liver Function Tests: Recent Labs  Lab 06/23/20 1224 06/24/20 0247 06/25/20 0333 06/26/20 0508 06/27/20 0403  AST 19  --   --   --   --   ALT  11  --   --   --   --   ALKPHOS 88  --   --   --   --   BILITOT 0.5  --   --   --   --   PROT 8.3*  --   --   --   --   ALBUMIN 4.2 3.6 3.7 3.8 3.8   No results for input(s): LIPASE, AMYLASE in the last 168 hours. No results for input(s): AMMONIA in the last 168 hours.  CBC: Recent Labs  Lab 06/23/20 1224 06/24/20 0247 06/25/20 0333 06/26/20 0508 06/27/20 0403  WBC 5.6 4.8 5.2 5.8 6.9  NEUTROABS 4.0  --   --   --   --   HGB 9.9* 8.5* 8.2* 8.8* 9.0*  HCT 31.0* 25.7* 25.1* 26.8* 27.3*  MCV 89.3 87.4 86.6 85.6 85.0  PLT 215 203 175 187 188    Cardiac Enzymes: No results for input(s): CKTOTAL, CKMB, CKMBINDEX, TROPONINI in the last 168 hours.  BNP: Invalid input(s): POCBNP  CBG: Recent Labs  Lab 06/23/20 1601 06/23/20 2124 06/24/20 0602  06/24/20 1111 06/24/20 1632  GLUCAP 167* 91 91 115* 88    Microbiology: Results for orders placed or performed during the hospital encounter of 06/22/20  SARS CORONAVIRUS 2 (TAT 6-24 HRS) Nasopharyngeal Nasopharyngeal Swab     Status: None   Collection Time: 06/22/20 11:19 AM   Specimen: Nasopharyngeal Swab  Result Value Ref Range Status   SARS Coronavirus 2 NEGATIVE NEGATIVE Final    Comment: (NOTE) SARS-CoV-2 target nucleic acids are NOT DETECTED.  The SARS-CoV-2 RNA is generally detectable in upper and lower respiratory specimens during the acute phase of infection. Negative results do not preclude SARS-CoV-2 infection, do not rule out co-infections with other pathogens, and should not be used as the sole basis for treatment or other patient management decisions. Negative results must be combined with clinical observations, patient history, and epidemiological information. The expected result is Negative.  Fact Sheet for Patients: SugarRoll.be  Fact Sheet for Healthcare Providers: https://www.woods-mathews.com/  This test is not yet approved or cleared by the [place] FDA and  has been authorized for detection and/or diagnosis of SARS-CoV-2 by FDA under an Emergency Use Authorization (EUA). This EUA will remain  in effect (meaning this test can be used) for the duration of the COVID-19 declaration under Se ction 564(b)(1) of the Act, 21 U.S.C. section 360bbb-3(b)(1), unless the authorization is terminated or revoked sooner.  Performed at [facility], [place] [address] [place], [place] [address]    *Note: Due to a large number of results and/or encounters for the requested time period, some results have not been displayed. A complete set of results can be found in Results Review.    Coagulation Studies: No results for input(s): LABPROT, INR in the last 72 hours.  Urinalysis: No results for input(s): COLORURINE,  LABSPEC, PHURINE, GLUCOSEU, HGBUR, BILIRUBINUR, KETONESUR, PROTEINUR, UROBILINOGEN, NITRITE, LEUKOCYTESUR in the last 72 hours.  Invalid input(s): APPERANCEUR    Imaging: No results found.   Medications:   . sodium chloride    . sodium chloride    . ferumoxytol    . furosemide 160 mg (06/27/20 0022)   . allopurinol  100 mg Oral Daily  . amiodarone  100 mg Oral Daily  . carvedilol  12.5 mg Oral BID WC  . gabapentin  300 mg Oral BID  . heparin  5,000 Units Subcutaneous Q8H  . isosorbide-hydrALAZINE  1 tablet Oral  TID  . levothyroxine  137 mcg Oral Q0600  . metolazone  5 mg Oral BID  . pantoprazole  40 mg Oral Daily  . potassium chloride  40 mEq Oral Once  . sodium chloride flush  3 mL Intravenous Q12H  . traZODone  50 mg Oral QHS   sodium chloride, sodium chloride, acetaminophen, ondansetron (ZOFRAN) IV, sodium chloride flush, traMADol  Assessment/ Plan:   CKD 4: Mild progression over time.  Baseline around 2.5 with creatinine around 3-year.  Likely cardiorenal syndrome attempting diuresis as below.  Slight rise in creatinine with diuresis but stable today.  needs continuing diuresis at the suggestion of heart failure.  Continue to monitor ins and outs and creatinine daily  CHF: RHC on 2/16 with elevated filling pressures, RV failure and moderate pulmonary venous hypertension.  Refractory to diuretics at this time  Continue IV Lasix 160 mg 3 times daily and metolazone 5 mg twice daily  Continue to monitor response to diuretics  Continue monitor creatinine daily  ANEMIA of iron deficiency and CKD-history of GI bleed will need close follow-up.    Status post IV iron  HTN: Management as below.  Continue current blood pressure medications  Hypothyroidism on replacement therapy  Nonischemic cardiomyopathy negative Cardiolite study 12/15  Diabetes mellitus diet controlled  Atrial fibrillation not a candidate for anticoagulation secondary to bleeding.   LOS: 4 Reesa Chew @TODAY @8 :50 AM

## 2020-06-27 NOTE — Plan of Care (Signed)
  Problem: Elimination: Goal: Will not experience complications related to bowel motility Outcome: Completed/Met

## 2020-06-28 DIAGNOSIS — I5033 Acute on chronic diastolic (congestive) heart failure: Secondary | ICD-10-CM | POA: Diagnosis not present

## 2020-06-28 LAB — RENAL FUNCTION PANEL
Albumin: 3.8 g/dL (ref 3.5–5.0)
Anion gap: 14 (ref 5–15)
BUN: 51 mg/dL — ABNORMAL HIGH (ref 8–23)
CO2: 26 mmol/L (ref 22–32)
Calcium: 9.4 mg/dL (ref 8.9–10.3)
Chloride: 89 mmol/L — ABNORMAL LOW (ref 98–111)
Creatinine, Ser: 3.39 mg/dL — ABNORMAL HIGH (ref 0.44–1.00)
GFR, Estimated: 14 mL/min — ABNORMAL LOW (ref >60)
Glucose, Bld: 116 mg/dL — ABNORMAL HIGH (ref 70–99)
Phosphorus: 2.6 mg/dL (ref 2.5–4.6)
Potassium: 3.6 mmol/L (ref 3.5–5.1)
Sodium: 129 mmol/L — ABNORMAL LOW (ref 135–145)

## 2020-06-28 LAB — CBC
HCT: 28.9 % — ABNORMAL LOW (ref 36.0–46.0)
Hemoglobin: 9.1 g/dL — ABNORMAL LOW (ref 12.0–15.0)
MCH: 27.1 pg (ref 26.0–34.0)
MCHC: 31.5 g/dL (ref 30.0–36.0)
MCV: 86 fL (ref 80.0–100.0)
Platelets: 204 10*3/uL (ref 150–400)
RBC: 3.36 MIL/uL — ABNORMAL LOW (ref 3.87–5.11)
RDW: 18.5 % — ABNORMAL HIGH (ref 11.5–15.5)
WBC: 7.4 10*3/uL (ref 4.0–10.5)
nRBC: 0 % (ref 0.0–0.2)

## 2020-06-28 MED ORDER — TORSEMIDE 20 MG PO TABS
80.0000 mg | ORAL_TABLET | Freq: Two times a day (BID) | ORAL | Status: DC
  Administered 2020-06-28 – 2020-06-29 (×3): 80 mg via ORAL
  Filled 2020-06-28 (×3): qty 4

## 2020-06-28 NOTE — Care Management Important Message (Signed)
Important Message  Patient Details  Name: Morgan Clay MRN: [id number] Date of Birth: Aug 23, 1945   Medicare Important Message Given:  Yes     Orbie Pyo 06/28/2020, 4:10 PM

## 2020-06-28 NOTE — Progress Notes (Signed)
[facility] ROUNDING NOTE   Subjective:    she had 1.8 liters UOP over 2/20.  Feels well.  Asks when she can go home but willing to stay until she's cleared.  She feels has fluid in her abdomen but much better than when she came in.  States on torsemide 40 mg BID at home  Review of systems:  Denies shortness of breath or chest pain  Denies n/v  ---------- Brief history:75 year old lady with history of nonischemic cardiomyopathy, she underwent Medtronic CRT-D biventricular pacer with improved ejection fraction last 2D echo showing ejection fraction of 60-65%.  She had a Cardiolite study done that showed no evidence of any ischemia.  She has had problems with GI bleeding and has been on Eliquis for atrial flutter.  She developed a gastric ulcer and noted to have small multiple lesions on AVMs on capsule endoscopy.  She had a right-sided heart catheterization 12/27/2019 with nonobstructive coronary artery disease.  She is undergoing right sided cardiac catheterization 06/23/2020 without the use of IV contrast she has a history of diabetes is diet controlled, history of gout, history of atrial flutter, history of hepatitis C treated with Harvoni, history of small bowel obstruction and history of GI bleed.  Chronic kidney disease is being followed by [name] since she last saw [name] ( who is since retired). Her most recent hospitalization 05/27/2020 was anemia and GI bleed status post colonoscopy and 2 units packed red blood cells with discontinuation of Eliquis.  Objective:  Vital signs in last 24 hours:  Temp:  [98.2 F (36.8 C)-99.7 F (37.6 C)] 98.2 F (36.8 C) (02/21 0841) Pulse Rate:  [58-62] 60 (02/21 0513) Resp:  [17-18] 17 (02/21 0513) BP: (111-122)/(41-63) 116/55 (02/21 0841) SpO2:  [98 %-100 %] 98 % (02/21 0841) Weight:  [60.3 kg] 60.3 kg (02/21 0218)  Weight change: 0.272 kg Filed Weights   06/26/20 0433 06/27/20 0500 06/28/20 0218  Weight: 60.5  kg 60 kg 60.3 kg    Intake/Output: I/O last 3 completed shifts: In: 1232 [P.O.:1100; IV Piggyback:132] Out: 2678 [Urine:2675; Stool:3]   Intake/Output this shift:  Total I/O In: 240 [P.O.:240] Out: 850 [Urine:850]  General adult female in bed in no acute distress HEENT normocephalic atraumatic extraocular movements intact sclera anicteric Neck supple trachea midline Lungs clear to auscultation bilaterally normal work of breathing at rest  Heart regular rate and rhythm no rubs or gallops appreciated Abdomen soft nontender  Slightly distended Extremities no pitting edema  Psych normal mood and affect     Basic Metabolic Panel: Recent Labs  Lab 06/21/20 1501 06/23/20 0931 06/24/20 0247 06/25/20 0333 06/26/20 0508 06/27/20 0403 06/28/20 0318  NA 137   < > 137 132* 130* 129* 129*  K 4.2   < > 3.5 3.6 3.8 3.3* 3.6  CL 100   < > 101 98 90* 88* 89*  CO2 23   < > 24 23 24 24 26   GLUCOSE 101*   < > 86 105* 86 96 116*  BUN 38*   < > 36* 34* 38* 44* 51*  CREATININE 3.25*   < > 3.04* 3.07* 3.29* 3.24* 3.39*  CALCIUM 9.2   < > 9.0 9.1 9.1 9.4 9.4  MG 2.4  --   --   --   --   --   --   PHOS  --   --  4.1 3.5 3.1 3.2 2.6   < > = values in this interval not displayed.  Liver Function Tests: Recent Labs  Lab 06/23/20 1224 06/24/20 0247 06/25/20 0333 06/26/20 0508 06/27/20 0403 06/28/20 0318  AST 19  --   --   --   --   --   ALT 11  --   --   --   --   --   ALKPHOS 88  --   --   --   --   --   BILITOT 0.5  --   --   --   --   --   PROT 8.3*  --   --   --   --   --   ALBUMIN 4.2 3.6 3.7 3.8 3.8 3.8   No results for input(s): LIPASE, AMYLASE in the last 168 hours. No results for input(s): AMMONIA in the last 168 hours.  CBC: Recent Labs  Lab 06/23/20 1224 06/24/20 0247 06/25/20 0333 06/26/20 0508 06/27/20 0403 06/28/20 0318  WBC 5.6 4.8 5.2 5.8 6.9 7.4  NEUTROABS 4.0  --   --   --   --   --   HGB 9.9* 8.5* 8.2* 8.8* 9.0* 9.1*  HCT 31.0* 25.7* 25.1* 26.8*  27.3* 28.9*  MCV 89.3 87.4 86.6 85.6 85.0 86.0  PLT 215 203 175 187 188 204    Cardiac Enzymes: No results for input(s): CKTOTAL, CKMB, CKMBINDEX, TROPONINI in the last 168 hours.  BNP: Invalid input(s): POCBNP  CBG: Recent Labs  Lab 06/23/20 1601 06/23/20 2124 06/24/20 0602 06/24/20 1111 06/24/20 1632  GLUCAP 167* 91 91 115* 88    Microbiology: Results for orders placed or performed during the hospital encounter of 06/22/20  SARS CORONAVIRUS 2 (TAT 6-24 HRS) Nasopharyngeal Nasopharyngeal Swab     Status: None   Collection Time: 06/22/20 11:19 AM   Specimen: Nasopharyngeal Swab  Result Value Ref Range Status   SARS Coronavirus 2 NEGATIVE NEGATIVE Final    Comment: (NOTE) SARS-CoV-2 target nucleic acids are NOT DETECTED.  The SARS-CoV-2 RNA is generally detectable in upper and lower respiratory specimens during the acute phase of infection. Negative results do not preclude SARS-CoV-2 infection, do not rule out co-infections with other pathogens, and should not be used as the sole basis for treatment or other patient management decisions. Negative results must be combined with clinical observations, patient history, and epidemiological information. The expected result is Negative.  Fact Sheet for Patients: SugarRoll.be  Fact Sheet for Healthcare Providers: https://www.woods-mathews.com/  This test is not yet approved or cleared by the [place] FDA and  has been authorized for detection and/or diagnosis of SARS-CoV-2 by FDA under an Emergency Use Authorization (EUA). This EUA will remain  in effect (meaning this test can be used) for the duration of the COVID-19 declaration under Se ction 564(b)(1) of the Act, 21 U.S.C. section 360bbb-3(b)(1), unless the authorization is terminated or revoked sooner.  Performed at [facility], [place] [address] [place], [place] [address]    *Note: Due to a large number of  results and/or encounters for the requested time period, some results have not been displayed. A complete set of results can be found in Results Review.    Coagulation Studies: No results for input(s): LABPROT, INR in the last 72 hours.  Urinalysis: No results for input(s): COLORURINE, LABSPEC, PHURINE, GLUCOSEU, HGBUR, BILIRUBINUR, KETONESUR, PROTEINUR, UROBILINOGEN, NITRITE, LEUKOCYTESUR in the last 72 hours.  Invalid input(s): APPERANCEUR    Imaging: No results found.   Medications:   . sodium chloride    . sodium chloride    .  furosemide 160 mg (06/28/20 1113)   . allopurinol  100 mg Oral Daily  . amiodarone  100 mg Oral Daily  . carvedilol  12.5 mg Oral BID WC  . gabapentin  300 mg Oral BID  . heparin  5,000 Units Subcutaneous Q8H  . isosorbide-hydrALAZINE  0.5 tablet Oral TID  . levothyroxine  137 mcg Oral Q0600  . metolazone  5 mg Oral BID  . pantoprazole  40 mg Oral Daily  . sodium chloride flush  3 mL Intravenous Q12H  . traZODone  50 mg Oral QHS   sodium chloride, sodium chloride, acetaminophen, ondansetron (ZOFRAN) IV, sodium chloride flush, traMADol  Assessment/ Plan:   CKD 4: Mild progression over time.  Baseline around 2.5 with creatinine around 3-year.  Likely cardiorenal syndrome   Slight rise in creatinine with diuresis   Adjust diuretics as below   CHF: RHC on 2/16 with elevated filling pressures, RV failure and moderate pulmonary venous hypertension.  Refractory to diuretics at this time  Stop IV Lasix 160 mg 3 times daily and metolazone 5 mg twice daily   Transition to torsemide 80 mg BID (home regimen 40 mg BID and admitted on same)  She may progress to need for HD at some point but does not have acute need   ANEMIA of iron deficiency and CKD-history of GI bleed will need close follow-up.  Status post IV iron   HTN: controlled  Hypothyroidism on replacement therapy per primary team  Nonischemic cardiomyopathy negative Cardiolite study  12/15  Diabetes mellitus diet controlled - per primary team   Atrial fibrillation not a candidate for anticoagulation secondary to bleeding per charting.  Spoke with CHF and we are in agreement.   Claudia Desanctis, MD 06/28/2020 11:57 AM

## 2020-06-28 NOTE — Progress Notes (Addendum)
Patient ID: Morgan Clay, female   DOB: [birth year], 75 y.o.   MRN: [id number]     Advanced Heart Failure Rounding Note  PCP-Cardiologist: No primary care provider on file.   Subjective:    Slightly improved UOP yesterday, w/ - 1.8L out. Wt however unchanged at 132 lb. SCr up slightly, 3.2>>3.4.  Feels slightly better other than mild diarrhea.   Echo: EF 55-60%, moderate LVH, D-shaped interventricular septum, RV moderately dilated with low normal function, severe biatrial enlargement, severe TR.   RHC Procedural Findings (2/17): Hemodynamics (mmHg) RA mean 21 RV 49/20 PA 59/24, mean 39 PCWP mean 26 Oxygen saturations: PA 64% AO 98% Cardiac Output (Fick) 5.07  Cardiac Index (Fick) 3.23 PVR 2.6 WU Cardiac Output (Thermo) 4.72  Cardiac Index (Thermo) 3.01  PVR 2.75 WU PAPI 1.7  Objective:   Weight Range: 60.3 kg Body mass index is 26.84 kg/m.   Vital Signs:   Temp:  [98.2 F (36.8 C)-99.7 F (37.6 C)] 98.2 F (36.8 C) (02/21 0841) Pulse Rate:  [58-62] 60 (02/21 0513) Resp:  [17-18] 17 (02/21 0513) BP: (111-122)/(41-63) 116/55 (02/21 0841) SpO2:  [98 %-100 %] 98 % (02/21 0841) Weight:  [60.3 kg] 60.3 kg (02/21 0218) Last BM Date: 06/26/20  Weight change: Filed Weights   06/26/20 0433 06/27/20 0500 06/28/20 0218  Weight: 60.5 kg 60 kg 60.3 kg    Intake/Output:   Intake/Output Summary (Last 24 hours) at 06/28/2020 1146 Last data filed at 06/28/2020 1113 Gross per 24 hour  Intake 946 ml  Output 2328 ml  Net -1382 ml      Physical Exam    PHYSICAL EXAM: General:  Well appearing. No respiratory difficulty HEENT: normal Neck: supple. JVD elevated to ear. Carotids 2+ bilat; no bruits. No lymphadenopathy or thyromegaly appreciated. Cor: PMI nondisplaced. Regular rate & rhythm. 3/6 SM  Lungs: clear Abdomen: soft, nontender, nondistended. No hepatosplenomegaly. No bruits or masses. Good bowel sounds. Extremities: no cyanosis, clubbing, rash, edema Neuro:  alert & oriented x 3, cranial nerves grossly intact. moves all 4 extremities w/o difficulty. Affect pleasant.    Telemetry   A-V pacedv-paced in 80s (personally reviewed)  EKG    No new EKG to review today   Labs    CBC Recent Labs    06/27/20 0403 06/28/20 0318  WBC 6.9 7.4  HGB 9.0* 9.1*  HCT 27.3* 28.9*  MCV 85.0 86.0  PLT 188 973   Basic Metabolic Panel Recent Labs    06/27/20 0403 06/28/20 0318  NA 129* 129*  K 3.3* 3.6  CL 88* 89*  CO2 24 26  GLUCOSE 96 116*  BUN 44* 51*  CREATININE 3.24* 3.39*  CALCIUM 9.4 9.4  PHOS 3.2 2.6   Liver Function Tests Recent Labs    06/27/20 0403 06/28/20 0318  ALBUMIN 3.8 3.8   No results for input(s): LIPASE, AMYLASE in the last 72 hours. Cardiac Enzymes No results for input(s): CKTOTAL, CKMB, CKMBINDEX, TROPONINI in the last 72 hours.  BNP: BNP (last 3 results) Recent Labs    06/18/20 1300 06/21/20 1501 06/23/20 1224  BNP 1,147.8* 981.7* 1,099.2*    ProBNP (last 3 results) Recent Labs    07/17/19 1352 07/23/19 1033  PROBNP 811.0* 637.0*     D-Dimer No results for input(s): DDIMER in the last 72 hours. Hemoglobin A1C No results for input(s): HGBA1C in the last 72 hours. Fasting Lipid Panel No results for input(s): CHOL, HDL, LDLCALC, TRIG, CHOLHDL, LDLDIRECT in the last 72 hours. Thyroid  Function Tests No results for input(s): TSH, T4TOTAL, T3FREE, THYROIDAB in the last 72 hours.  Invalid input(s): FREET3  Other results:   Imaging    No results found.   Medications:     Scheduled Medications: . allopurinol  100 mg Oral Daily  . amiodarone  100 mg Oral Daily  . carvedilol  12.5 mg Oral BID WC  . gabapentin  300 mg Oral BID  . heparin  5,000 Units Subcutaneous Q8H  . isosorbide-hydrALAZINE  0.5 tablet Oral TID  . levothyroxine  137 mcg Oral Q0600  . metolazone  5 mg Oral BID  . pantoprazole  40 mg Oral Daily  . sodium chloride flush  3 mL Intravenous Q12H  . traZODone  50 mg  Oral QHS    Infusions: . sodium chloride    . sodium chloride    . furosemide 160 mg (06/28/20 1113)    PRN Medications: sodium chloride, sodium chloride, acetaminophen, ondansetron (ZOFRAN) IV, sodium chloride flush, traMADol    Assessment/Plan   1. Chronic primarily diastolic CHF with prominent RV dysfunction: Patient has history of presumed nonischemic cardiomyopathy, EF as low at 25-30% in 12/15.  Medtronic CRT-D device placed with improvement in LV function, echo in 4/19 showed EF improved to 60-65%. PYP scan in 3/20 was equivocal for transthyretin amyloidosis, PYP scan in 1/22 was not suggestive of transthyretin amyloidosis. LHC/RHC in 8/21 showed nonobstructive CAD, normal filling pressures and cardiac output. Echo 8/21 showed EF 50-55%, mildly decreased RV systolic function, moderate AI. NYHA class IIIb symptoms with progressive volume overload and renal failure recently.  [place] 2/16 showed elevated R>L heart filling pressures with low PAPI at 1.7 and preserved cardiac output. V/Q scan not suggestive of chronic PEs.  Decision made to admit for diuresis and monitoring of renal function. Repeat echo in hospital showed EF 55-60%, moderate LVH, D-shaped interventricular septum, RV moderately dilated with low normal function, severe biatrial enlargement, severe TR => confirming prominent RV failure.  Patient remains mildly volume overloaded on exam today with JVP elevated, slow diuresis.  She is feeling better.  JVP is going to be difficult to follow due to severe TR.  Creatinine up slightly from 3.2>>3.4.  - d/w nephrology. Will stop IV Lasix and transition back to torsemide. Will increase to 80 mg bid (previously 20 bid). Follow BMP  - Continue Bidil 1/2 tab tid.  - Continue Coreg 12.5 mg bid.  2. Atrial flutter/fibrillation: History of atypical flutter and atrial fibrillation.Lately has been in NSR on amiodarone 100 mg daily. She is off apixaban with recurrent severe GI bleeding.  -  Continue amiodarone.  - Has been referred to Dr. Quentin Ore for evaluation for Watchman device. 3. CKD stage IV: Followed by nephrology as outpatient. Creatinine mildly higher at 3.4 this morning but she has finally diuresed some. BP stable.  -  transition back to torsemide today, 80 mg bid  - No current indication for HD, but concerned about our ability to effectively diurese her long-term. HD will be difficult with RV failure.  - avoid hypotension  - Nephrology following.  4. Anemia: Recurrent severe GI bleeding. She is now off Eliquis. Hgb trending up 9.1 today. Transferrin saturation 5% at admission.  - Received Feraheme 2/16  - As above, Watchman evaluation eventually. 5. Hypertension:stable/ improved w/ Bidil dose reduction   - avoid hypotension w/ AKI - continue on reduced Bidil 1/2 tab tid.   Length of Stay: [address], PA-C  06/28/2020, 11:46 AM  Advanced  Heart Failure Team Pager [phone] (M-F; [place])  Please contact [facility] for night-coverage after hours (4p -7a ) and weekends on [email or web address]  Patient seen with PA, agree with the above note.   Some diuresis again yesterday, creatinine higher at 3.4.  She feels better overall.   General: NAD Neck: JVP 14 cm, no thyromegaly or thyroid nodule.  Lungs: Clear to auscultation bilaterally with normal respiratory effort. CV: Nondisplaced PMI.  Heart regular S1/S2, no S3/S4, 2/6 HSM LLSB.  No peripheral edema.   Abdomen: Soft, nontender, no hepatosplenomegaly, no distention.  Skin: Intact without lesions or rashes.  Neurologic: Alert and oriented x 3.  Psych: Normal affect. Extremities: No clubbing or cyanosis.  HEENT: Normal.   Volume status difficult given severe TR, but subjectively she feels much better.  She is on high dose IV Lasix and metolazone now.  Discussed with nephrology, will transition to torsemide 80 mg bid and watch her for a day to see how she does.  Hopefully ready for home soon.  No urgent  need for HD but concerned that she will need down the road.   Loralie Champagne 06/28/2020 12:05 PM

## 2020-06-29 ENCOUNTER — Ambulatory Visit: Payer: Medicare Other | Admitting: Physical Therapy

## 2020-06-29 DIAGNOSIS — I5033 Acute on chronic diastolic (congestive) heart failure: Secondary | ICD-10-CM | POA: Diagnosis not present

## 2020-06-29 LAB — RENAL FUNCTION PANEL
Albumin: 3.8 g/dL (ref 3.5–5.0)
Anion gap: 12 (ref 5–15)
BUN: 57 mg/dL — ABNORMAL HIGH (ref 8–23)
CO2: 27 mmol/L (ref 22–32)
Calcium: 9.7 mg/dL (ref 8.9–10.3)
Chloride: 86 mmol/L — ABNORMAL LOW (ref 98–111)
Creatinine, Ser: 3.25 mg/dL — ABNORMAL HIGH (ref 0.44–1.00)
GFR, Estimated: 15 mL/min — ABNORMAL LOW (ref >60)
Glucose, Bld: 102 mg/dL — ABNORMAL HIGH (ref 70–99)
Phosphorus: 3.3 mg/dL (ref 2.5–4.6)
Potassium: 3.5 mmol/L (ref 3.5–5.1)
Sodium: 125 mmol/L — ABNORMAL LOW (ref 135–145)

## 2020-06-29 MED ORDER — POTASSIUM CHLORIDE CRYS ER 20 MEQ PO TBCR
20.0000 meq | EXTENDED_RELEASE_TABLET | Freq: Once | ORAL | Status: AC
  Administered 2020-06-29: 20 meq via ORAL
  Filled 2020-06-29: qty 1

## 2020-06-29 MED ORDER — DARBEPOETIN ALFA 40 MCG/0.4ML IJ SOSY
40.0000 ug | PREFILLED_SYRINGE | Freq: Once | INTRAMUSCULAR | Status: AC
Start: 2020-06-29 — End: 2020-06-29
  Administered 2020-06-29: 40 ug via SUBCUTANEOUS
  Filled 2020-06-29: qty 0.4

## 2020-06-29 NOTE — Progress Notes (Signed)
CARDIAC REHAB PHASE I   PRE:  Rate/Rhythm: 60 pacing    BP: sitting 125/57    SaO2: 98 RA  MODE:  Ambulation: 470 ft   POST:  Rate/Rhythm: 65 pacing    BP: sitting 143/55     SaO2: 99 RA  Tolerated well with RW. She uses rollator at home. Discussed HF booklet, low sodium, exercise. She is receptive. To recliner.  [place], ACSM 06/29/2020 2:23 PM

## 2020-06-29 NOTE — Progress Notes (Signed)
  Mobility Specialist Criteria Algorithm Info.  Mobility Team: HOB elevated: Activity: Ambulated in hall; Dangled on edge of bed Range of motion: Active; All extremities Level of assistance: Modified independent, requires aide device or extra time Assistive device: Front wheel walker Minutes sitting in chair:  Minutes stood: 5 minutes Minutes ambulated: 5 minutes Distance ambulated (ft): 500 ft Mobility response: Tolerated well Bed Position: Semi-fowlers  Patient appears to be stronger and feeling better than past sessions. Ambulated in hallway 500 feet modified independently with RW. Pt tolerated ambulation well without incident or complaints and is now dangling EOB with all needs met.   06/29/2020 10:50 AM

## 2020-06-29 NOTE — Progress Notes (Signed)
Education Assessment and Provision:  Detailed education and instructions provided on heart failure disease management including the following:  Signs and symptoms of Heart Failure When to call the physician Importance of daily weights Low sodium diet Fluid restriction Medication management Anticipated future follow-up appointments  Patient education given on each of the above topics.  Patient acknowledges understanding via teach back method and acceptance of all instructions.  Education Materials:  "Living Better With Heart Failure" Booklet, HF zone tool, & Daily Weight Tracker Tool.  Patient has scale at home: yes Patient has pill box at home: no, given from AHF clinic.  Pricilla Holm, RN, BSN Heart Failure Nurse Navigator [phone]

## 2020-06-29 NOTE — Discharge Summary (Addendum)
Advanced Heart Failure Team  Discharge Summary   Patient ID: Morgan Clay MRN: [id number], DOB/AGE: 09/03/1945 75 y.o. Admit date: 06/23/2020 D/C date:     07/12/2020   Primary Discharge Diagnoses:  Acute on Chronic Primarily Diastolic CHF w/ Prominent RV Dysfunction  ESRD requiring HD  Atrial Flutter/ Fibrillation  Anemia Hypertension    Pertinent PHM + Hospital Course:   75 y.o.with history of nonischemic cardiomyopathy with recovery of EF with BiV pacing, recurrent GI bleeding, and paroxysmal atrial flutter was initially referred by Dr. Caryl Comes for evaluation of CHF. Patient has a history of presumed nonischemic cardiomyopathy for a number of years. Echo in 12/15 showed EF 25-30% and Cardiolite at that time showed no ischemia. She had Medtronic CRT-D system placed, and EF recovered. In 4/19, echo showed EF 60-65%.   She has additionally had problems with GI bleeding. She had been on Eliquis for atrial flutter but this was stopped with recurrent GI bleeding. She had bleeding from a gastric ulcer but was also noted to have multiple small bowel AVMs on capsule endoscopy out of reach of endoscope. Eliquis was stopped. She has had no melena or BRBPR off Eliquis.   Echo in 10/19 showed EF 55-60%, mild-moderate MR, moderate AI, moderate RV dilation.   She was admitted in 1/20 with diverticulitis and ended up having a sigmoid colectomy.   Echo in 9/20 showed EF lower at 45-50%, moderate AI.   She had LHC/RHC in 8/21 with nonobstructive CAD, normal filling pressures, and preserved cardiac output. Echo 8/21 showed EF 50-55%, mild LVH, mildly decreased RV systolic function, moderate aortic insufficiency.  Back in in A fib 10/21. Amiodarone was increased.Chemically converted so DC-CV canceled.  Patient underwent small bowel enteroscopy 03/02/2020 with findings trace esophageal varix, small superficial AVMs prepyloric stomach with other punctate vascular lesions treated with APC,  benign duodenal polyp. Etiology likely secondary to jejunal AVM versus early GAVE vsportal gastropathy.Patient underwent small bowel enteroscopy 03/02/2020 with findings trace esophageal varix, small superficial AVMs prepyloric stomach with other punctate vascular lesions treated with APC, benign duodenal polyp. Etiology likely secondary to jejunal AVM versus early GAVE vsportal gastropathy. Eliquis restarted.   Hgb dropped back down to 7.7 in 12/21. Saw [name] and capsule placed but got stuck in the stomach.   She was admitted on 05/27/20 with dizziness, and a fall preceded by melena found to have further GI bleeding hgb of 5.1 and an AKI. Eliquis was held and she was transfused 2u PRBC's. Repeat pill endoscope showed bleeding in distal small bowel.Colonoscopy found no bleeding but large avm in cecum argon lasered. She has been off Eliquis since this time.   She has had no further over GI bleeding, but has had steady worsening of exertional dyspnea to the point of NYHA class IIIb symptoms.  We have tried increasing diuretics as an outpatient but she has developed worsening renal failure, recently creatinine up to as high as 3.25.    With intractable volume overload and worsening renal function, she was referred for RHC on 2/16 which showed elevated R>L heart filling pressures with low PAPI at 1.7 and preserved cardiac output. V/Q scan not suggestive of chronic PEs.  Decision made to admit for diuresis and monitoring of renal function. Repeat echo in hospital showed EF 55-60%, moderate LVH, D-shaped interventricular septum, RV moderately dilated with low normal function, severe biatrial enlargement, severe TR => confirming prominent RV failure. She was diuresed w/ IV Lasix. Nephrology was consulted and assisted w/ diuretic dosing.  Unfortunately, she developed worsening renal failure w/ progression to ESRD requiring initiation if iHD. She tolerated HD well and will continue on MWF schedule.  Nephrology has arranged for outpatient HD and has arranged for outpatient consultation w/ [facility] for permanent HD access.   On 3/7, she was last seen and examined by Dr. [name] and felt stable for d/c home. Nephrology also cleared for d/c home after she completed her HD session. Post hospital f/u in [facility] has been arranged as well as f/u w/ [facility] and [facility].     See Detailed Hospital Problem List  Below.    1.Chronic primarily diastolic CHF with prominent RV dysfunction: Patient has history of presumed nonischemic cardiomyopathy, EF as low at 25-30% in 12/15. Medtronic CRT-D device placed with improvement in LV function, echo in 4/19 showed EF improved to 60-65%. PYP scan in 3/20 was equivocal for transthyretin amyloidosis, PYP scan in 1/22 was not suggestive of transthyretin amyloidosis. LHC/RHC in 8/21 showed nonobstructive CAD, normal filling pressures and cardiac output. Echo 8/21 showed EF 50-55%, mildly decreased RV systolic function, moderate AI. NYHA class IIIb symptoms withprogressive volume overload and renal failure recently. [place] 2/16 showed elevated R>L heart filling pressures with low PAPI at 1.7 and preserved cardiac output. V/Q scan not suggestive of chronic PEs.  Decision made to admit for diuresis and monitoring of renal function. Repeat echo in hospital showed EF 55-60%, moderate LVH, D-shaped interventricular septum, RV moderately dilated with low normal function, severe biatrial enlargement, severe TR => confirming prominent RV failure.  Breathing significantly better since admission.  Tolerating HD for fluid removal.  BP stable.  - Getting HD today.  - Continue Bidil 1/2 tab tid.  - Continue coreg 3.125mg  2. Atrial flutter/fibrillation: History of atypical flutter and atrial fibrillation.Lately has been inNSR on amiodarone 100 mg daily. She is off apixaban with recurrent severe GI bleeding.  - Continue amiodarone.  -Has been referredto Dr. Quentin Ore for evaluation for Watchman  device. 3.CKD stage V: Followed by nephrology as outpatient. Creatinine worsened during admission, now ESRD.  - Getting HD today.  4. Anemia: Recurrent severe GI bleeding. She is now off Eliquis. Transferrin saturation 5% at admission.  - Received Feraheme 2/16  - As above, Watchman evaluationeventually. 5. Hypertension:stable/ improved w/ Bidil dose reduction    - continue on reduced Bidil 1/2 tab tid, carvedilol 3.125mg  BID  6. Cirrhosis: Was not able to order elastography inpatient but US shows stable, compensated cirrhosis with normal doppler studies with no ascites   RHC Procedural Findings: Hemodynamics (mmHg) RA mean 21 RV 49/20 PA 59/24, mean 39 PCWP mean 26 Oxygen saturations: PA 64% AO 98% Cardiac Output (Fick) 5.07  Cardiac Index (Fick) 3.23 PVR 2.6 WU Cardiac Output (Thermo) 4.72  Cardiac Index (Thermo) 3.01  PVR 2.75 WU PAPI 1.7    Discharge Weight Range: 124 lb  Discharge Vitals: Blood pressure 134/61, pulse 64, temperature 98.6 F (37 C), temperature source Oral, resp. rate (!) 22, height 4\' 11"  (1.499 m), weight 56.5 kg, SpO2 100 %.  Labs: Lab Results  Component Value Date   WBC 6.4 07/12/2020   HGB 10.8 (L) 07/12/2020   HCT 34.7 (L) 07/12/2020   MCV 88.5 07/12/2020   PLT 125 (L) 07/12/2020    Recent Labs  Lab 07/12/20 0508  NA 128*  K 3.8  CL 93*  CO2 21*  BUN 32*  CREATININE 3.73*  CALCIUM 9.6  GLUCOSE 102*   Lab Results  Component Value Date   CHOL 89  07/23/2019   HDL 36.40 (L) 07/23/2019   LDLCALC 33 07/23/2019   TRIG 97.0 07/23/2019   BNP (last 3 results) Recent Labs    06/18/20 1300 06/21/20 1501 06/23/20 1224  BNP 1,147.8* 981.7* 1,099.2*    ProBNP (last 3 results) Recent Labs    07/17/19 1352 07/23/19 1033  PROBNP 811.0* 637.0*     Diagnostic Studies/Procedures   No results found.  Discharge Medications   Allergies as of 07/12/2020   No Known Allergies     Medication List    STOP taking these  medications   colchicine 0.6 MG tablet   potassium chloride SA 20 MEQ tablet Commonly known as: KLOR-CON   spironolactone 25 MG tablet Commonly known as: ALDACTONE   tiZANidine 2 MG tablet Commonly known as: ZANAFLEX   torsemide 20 MG tablet Commonly known as: DEMADEX     TAKE these medications   allopurinol 100 MG tablet Commonly known as: ZYLOPRIM Take 1 tablet (100 mg total) by mouth daily. Start taking on: July 13, 2020 What changed:   medication strength  how much to take   amiodarone 100 MG tablet Commonly known as: PACERONE Take 1 tablet (100 mg total) by mouth daily.   carvedilol 3.125 MG tablet Commonly known as: COREG Take 1 tablet (3.125 mg total) by mouth 2 (two) times daily with a meal. What changed:   medication strength  how much to take  when to take this   cromolyn 4 % ophthalmic solution Commonly known as: OPTICROM Place 1 drop into both eyes 2 (two) times daily.   ferrous sulfate 325 (65 FE) MG tablet Take 1 tablet (325 mg total) by mouth daily.   gabapentin 300 MG capsule Commonly known as: NEURONTIN Take 1 capsule (300 mg total) by mouth daily. What changed: when to take this   isosorbide-hydrALAZINE 20-37.5 MG tablet Commonly known as: BIDIL Take 0.5 tablets by mouth 3 (three) times daily. What changed: how much to take   levothyroxine 137 MCG tablet Commonly known as: SYNTHROID Take 1 tablet (137 mcg total) by mouth daily before breakfast.   nitroGLYCERIN 0.4 MG SL tablet Commonly known as: NITROSTAT Place 0.4 mg under the tongue every 5 (five) minutes x 3 doses as needed for chest pain.   pantoprazole 40 MG tablet Commonly known as: Protonix Take 1 tablet (40 mg total) by mouth daily.   traZODone 50 MG tablet Commonly known as: DESYREL Take 50 mg by mouth at bedtime.       Disposition   The patient will be discharged in stable condition to home.   Follow-up Information    Billie Ruddy, MD. Go on 07/05/2020.    Specialty: Family Medicine Why: @11 :00am please arrive @10 :45am Contact information: [place] [address] [phone]        [place] Follow up on 07/27/2020.   Specialty: Cardiology Why: 12:00 PM  [facility] at [facility], [address]  Contact information: [address] [phone] [place]       [facility] Follow up.   Why: outpatient palliative services Contact information: [place] [phone]                Duration of Discharge Encounter: Greater than 35 minutes   Signed, Nelida Gores  07/12/2020, 12:59 PM

## 2020-06-29 NOTE — Progress Notes (Signed)
[facility] ROUNDING NOTE   Subjective:    she had 2.1 liters UOP over 2/21.  She was transitioned to PO torsemide - 80 mg BID yesterday. She feels well.  Saw [name] and can't remember who she saw after that but has established with another provider she states.    Review of systems:  Denies shortness of breath or chest pain  Denies n/v  ---------- Brief history:75 year old lady with history of nonischemic cardiomyopathy, she underwent Medtronic CRT-D biventricular pacer with improved ejection fraction last 2D echo showing ejection fraction of 60-65%.  She had a Cardiolite study done that showed no evidence of any ischemia.  She has had problems with GI bleeding and has been on Eliquis for atrial flutter.  She developed a gastric ulcer and noted to have small multiple lesions on AVMs on capsule endoscopy.  She had a right-sided heart catheterization 12/27/2019 with nonobstructive coronary artery disease.  She is undergoing right sided cardiac catheterization 06/23/2020 without the use of IV contrast she has a history of diabetes is diet controlled, history of gout, history of atrial flutter, history of hepatitis C treated with Harvoni, history of small bowel obstruction and history of GI bleed.  Chronic kidney disease is being followed by [name] since she last saw [name] ( who is since retired). Her most recent hospitalization 05/27/2020 was anemia and GI bleed status post colonoscopy and 2 units packed red blood cells with discontinuation of Eliquis.  Objective:  Vital signs in last 24 hours:  Temp:  [97.7 F (36.5 C)-98.5 F (36.9 C)] 97.7 F (36.5 C) (02/22 0806) Pulse Rate:  [59-100] 100 (02/22 0806) Resp:  [18-20] 20 (02/22 0806) BP: (96-120)/(48-53) 120/51 (02/22 0806) SpO2:  [58 %-100 %] 58 % (02/22 0806) Weight:  [60 kg] 60 kg (02/22 0024)  Weight change: -0.318 kg Filed Weights   06/27/20 0500 06/28/20 0218 06/29/20 0024  Weight: 60 kg 60.3  kg 60 kg    Intake/Output: I/O last 3 completed shifts: In: 1086 [P.O.:1020; IV Piggyback:66] Out: 2728 [Urine:2725; Stool:3]   Intake/Output this shift:  Total I/O In: 360 [P.O.:360] Out: 250 [Urine:250]  General adult female in bed in no acute distress * HEENT normocephalic atraumatic extraocular movements intact sclera anicteric Neck supple trachea midline Lungs clear to auscultation bilaterally normal work of breathing at rest  Heart regular rate and rhythm no rubs or gallops appreciated Abdomen soft nontender  Slightly distended Extremities no pitting edema  Psych normal mood and affect Neuro alert and oriented x 3 provides hx and follows commands     Basic Metabolic Panel: Recent Labs  Lab 06/25/20 0333 06/26/20 0508 06/27/20 0403 06/28/20 0318 06/29/20 0341  NA 132* 130* 129* 129* 125*  K 3.6 3.8 3.3* 3.6 3.5  CL 98 90* 88* 89* 86*  CO2 23 24 24 26 27   GLUCOSE 105* 86 96 116* 102*  BUN 34* 38* 44* 51* 57*  CREATININE 3.07* 3.29* 3.24* 3.39* 3.25*  CALCIUM 9.1 9.1 9.4 9.4 9.7  PHOS 3.5 3.1 3.2 2.6 3.3    Liver Function Tests: Recent Labs  Lab 06/23/20 1224 06/24/20 0247 06/25/20 0333 06/26/20 0508 06/27/20 0403 06/28/20 0318 06/29/20 0341  AST 19  --   --   --   --   --   --   ALT 11  --   --   --   --   --   --   ALKPHOS 88  --   --   --   --   --   --  BILITOT 0.5  --   --   --   --   --   --   PROT 8.3*  --   --   --   --   --   --   ALBUMIN 4.2   < > 3.7 3.8 3.8 3.8 3.8   < > = values in this interval not displayed.   No results for input(s): LIPASE, AMYLASE in the last 168 hours. No results for input(s): AMMONIA in the last 168 hours.  CBC: Recent Labs  Lab 06/23/20 1224 06/24/20 0247 06/25/20 0333 06/26/20 0508 06/27/20 0403 06/28/20 0318  WBC 5.6 4.8 5.2 5.8 6.9 7.4  NEUTROABS 4.0  --   --   --   --   --   HGB 9.9* 8.5* 8.2* 8.8* 9.0* 9.1*  HCT 31.0* 25.7* 25.1* 26.8* 27.3* 28.9*  MCV 89.3 87.4 86.6 85.6 85.0 86.0  PLT 215  203 175 187 188 204    Cardiac Enzymes: No results for input(s): CKTOTAL, CKMB, CKMBINDEX, TROPONINI in the last 168 hours.  BNP: Invalid input(s): POCBNP  CBG: Recent Labs  Lab 06/23/20 1601 06/23/20 2124 06/24/20 0602 06/24/20 1111 06/24/20 1632  GLUCAP 167* 91 91 115* 88    Microbiology: Results for orders placed or performed during the hospital encounter of 06/22/20  SARS CORONAVIRUS 2 (TAT 6-24 HRS) Nasopharyngeal Nasopharyngeal Swab     Status: None   Collection Time: 06/22/20 11:19 AM   Specimen: Nasopharyngeal Swab  Result Value Ref Range Status   SARS Coronavirus 2 NEGATIVE NEGATIVE Final    Comment: (NOTE) SARS-CoV-2 target nucleic acids are NOT DETECTED.  The SARS-CoV-2 RNA is generally detectable in upper and lower respiratory specimens during the acute phase of infection. Negative results do not preclude SARS-CoV-2 infection, do not rule out co-infections with other pathogens, and should not be used as the sole basis for treatment or other patient management decisions. Negative results must be combined with clinical observations, patient history, and epidemiological information. The expected result is Negative.  Fact Sheet for Patients: SugarRoll.be  Fact Sheet for Healthcare Providers: https://www.woods-mathews.com/  This test is not yet approved or cleared by the [place] FDA and  has been authorized for detection and/or diagnosis of SARS-CoV-2 by FDA under an Emergency Use Authorization (EUA). This EUA will remain  in effect (meaning this test can be used) for the duration of the COVID-19 declaration under Se ction 564(b)(1) of the Act, 21 U.S.C. section 360bbb-3(b)(1), unless the authorization is terminated or revoked sooner.  Performed at [facility], [place] [address] [place], [place] [address]    *Note: Due to a large number of results and/or encounters for the requested time period, some  results have not been displayed. A complete set of results can be found in Results Review.    Coagulation Studies: No results for input(s): LABPROT, INR in the last 72 hours.  Urinalysis: No results for input(s): COLORURINE, LABSPEC, PHURINE, GLUCOSEU, HGBUR, BILIRUBINUR, KETONESUR, PROTEINUR, UROBILINOGEN, NITRITE, LEUKOCYTESUR in the last 72 hours.  Invalid input(s): APPERANCEUR    Imaging: No results found.   Medications:   . sodium chloride    . sodium chloride     . allopurinol  100 mg Oral Daily  . amiodarone  100 mg Oral Daily  . carvedilol  12.5 mg Oral BID WC  . gabapentin  300 mg Oral BID  . heparin  5,000 Units Subcutaneous Q8H  . isosorbide-hydrALAZINE  0.5 tablet Oral TID  .  levothyroxine  137 mcg Oral Q0600  . pantoprazole  40 mg Oral Daily  . sodium chloride flush  3 mL Intravenous Q12H  . torsemide  80 mg Oral BID  . traZODone  50 mg Oral QHS   sodium chloride, sodium chloride, acetaminophen, ondansetron (ZOFRAN) IV, sodium chloride flush, traMADol  Assessment/ Plan:   CKD 4: Mild progression over time.  Baseline around 2.5 with creatinine around 3-year.  Likely cardiorenal syndrome   Slight rise in creatinine with diuresis   Adjust diuretics as below  Would decrease gabapentin to no more than 300 mg daily given renal function   She is at risk of worsening renal function which we have discussed but no acute need for HD  CHF: RHC on 2/16 with elevated filling pressures, RV failure and moderate pulmonary venous hypertension.  Refractory to diuretics at this time  Would continue torsemide 80 mg BID (home regimen 40 mg BID and admitted with CHF on same)  ANEMIA of iron deficiency and CKD-history of GI bleed will need close follow-up.  Status post IV iron.  aranesp 40 mcg once on 2/22  HTN: controlled  Hypothyroidism on replacement therapy per primary team  Nonischemic cardiomyopathy negative Cardiolite study 12/15  Diabetes mellitus diet  controlled - per primary team   Atrial fibrillation not a candidate for anticoagulation secondary to bleeding per charting.  Stable from a renal standpoint on current regimen.  Disposition per cardiology.  Will set up follow-up appt with [facility] in 1-2 weeks  Claudia Desanctis, MD 06/29/2020 12:25 PM

## 2020-06-29 NOTE — Progress Notes (Addendum)
Patient ID: Morgan Clay, female   DOB: November 09, 1945, 75 y.o.   MRN: [id number]     Advanced Heart Failure Rounding Note  PCP-Cardiologist: No primary care provider on file.   Subjective:    Yesterday switched to torsemide. Creatinine 3.4>3.25.   Feeling better. Denies SOB.    Echo: EF 55-60%, moderate LVH, D-shaped interventricular septum, RV moderately dilated with low normal function, severe biatrial enlargement, severe TR.   RHC Procedural Findings (2/17): Hemodynamics (mmHg) RA mean 21 RV 49/20 PA 59/24, mean 39 PCWP mean 26 Oxygen saturations: PA 64% AO 98% Cardiac Output (Fick) 5.07  Cardiac Index (Fick) 3.23 PVR 2.6 WU Cardiac Output (Thermo) 4.72  Cardiac Index (Thermo) 3.01  PVR 2.75 WU PAPI 1.7  Objective:   Weight Range: 60 kg Body mass index is 26.7 kg/m.   Vital Signs:   Temp:  [98.2 F (36.8 C)-98.5 F (36.9 C)] 98.5 F (36.9 C) (02/22 0341) Pulse Rate:  [59-60] 59 (02/22 0341) Resp:  [18-20] 18 (02/22 0341) BP: (96-118)/(48-55) 96/48 (02/22 0341) SpO2:  [90 %-100 %] 90 % (02/22 0341) Weight:  [60 kg] 60 kg (02/22 0024) Last BM Date: 06/28/20  Weight change: Filed Weights   06/27/20 0500 06/28/20 0218 06/29/20 0024  Weight: 60 kg 60.3 kg 60 kg    Intake/Output:   Intake/Output Summary (Last 24 hours) at 06/29/2020 0735 Last data filed at 06/29/2020 5329 Gross per 24 hour  Intake 720 ml  Output 1750 ml  Net -1030 ml      Physical Exam   General:  Sitting on the side of the bed.  No resp difficulty HEENT: normal Neck: supple. no JVD. Carotids 2+ bilat; no bruits. No lymphadenopathy or thryomegaly appreciated. Cor: PMI nondisplaced. Regular rate & rhythm. No rubs, gallops. TR. Lungs: clear Abdomen: soft, nontender, nondistended. No hepatosplenomegaly. No bruits or masses. Good bowel sounds. Extremities: no cyanosis, clubbing, rash, edema Neuro: alert & orientedx3, cranial nerves grossly intact. moves all 4 extremities w/o  difficulty. Affect pleasant  Telemetry   A-V paced 80s   EKG    No new EKG to review today   Labs    CBC Recent Labs    06/27/20 0403 06/28/20 0318  WBC 6.9 7.4  HGB 9.0* 9.1*  HCT 27.3* 28.9*  MCV 85.0 86.0  PLT 188 924   Basic Metabolic Panel Recent Labs    06/28/20 0318 06/29/20 0341  NA 129* 125*  K 3.6 3.5  CL 89* 86*  CO2 26 27  GLUCOSE 116* 102*  BUN 51* 57*  CREATININE 3.39* 3.25*  CALCIUM 9.4 9.7  PHOS 2.6 3.3   Liver Function Tests Recent Labs    06/28/20 0318 06/29/20 0341  ALBUMIN 3.8 3.8   No results for input(s): LIPASE, AMYLASE in the last 72 hours. Cardiac Enzymes No results for input(s): CKTOTAL, CKMB, CKMBINDEX, TROPONINI in the last 72 hours.  BNP: BNP (last 3 results) Recent Labs    06/18/20 1300 06/21/20 1501 06/23/20 1224  BNP 1,147.8* 981.7* 1,099.2*    ProBNP (last 3 results) Recent Labs    07/17/19 1352 07/23/19 1033  PROBNP 811.0* 637.0*     D-Dimer No results for input(s): DDIMER in the last 72 hours. Hemoglobin A1C No results for input(s): HGBA1C in the last 72 hours. Fasting Lipid Panel No results for input(s): CHOL, HDL, LDLCALC, TRIG, CHOLHDL, LDLDIRECT in the last 72 hours. Thyroid Function Tests No results for input(s): TSH, T4TOTAL, T3FREE, THYROIDAB in the last 72 hours.  Invalid  input(s): FREET3  Other results:   Imaging    No results found.   Medications:     Scheduled Medications: . allopurinol  100 mg Oral Daily  . amiodarone  100 mg Oral Daily  . carvedilol  12.5 mg Oral BID WC  . gabapentin  300 mg Oral BID  . heparin  5,000 Units Subcutaneous Q8H  . isosorbide-hydrALAZINE  0.5 tablet Oral TID  . levothyroxine  137 mcg Oral Q0600  . pantoprazole  40 mg Oral Daily  . sodium chloride flush  3 mL Intravenous Q12H  . torsemide  80 mg Oral BID  . traZODone  50 mg Oral QHS    Infusions: . sodium chloride    . sodium chloride      PRN Medications: sodium chloride, sodium  chloride, acetaminophen, ondansetron (ZOFRAN) IV, sodium chloride flush, traMADol    Assessment/Plan   1. Chronic primarily diastolic CHF with prominent RV dysfunction: Patient has history of presumed nonischemic cardiomyopathy, EF as low at 25-30% in 12/15.  Medtronic CRT-D device placed with improvement in LV function, echo in 4/19 showed EF improved to 60-65%. PYP scan in 3/20 was equivocal for transthyretin amyloidosis, PYP scan in 1/22 was not suggestive of transthyretin amyloidosis. LHC/RHC in 8/21 showed nonobstructive CAD, normal filling pressures and cardiac output. Echo 8/21 showed EF 50-55%, mildly decreased RV systolic function, moderate AI. NYHA class IIIb symptoms with progressive volume overload and renal failure recently.  [place] 2/16 showed elevated R>L heart filling pressures with low PAPI at 1.7 and preserved cardiac output. V/Q scan not suggestive of chronic PEs.  Decision made to admit for diuresis and monitoring of renal function. Repeat echo in hospital showed EF 55-60%, moderate LVH, D-shaped interventricular septum, RV moderately dilated with low normal function, severe biatrial enlargement, severe TR => confirming prominent RV failure.  Patient remains mildly volume overloaded on exam today with JVP elevated, slow diuresis.  She is feeling better.  JVP is going to be difficult to follow due to severe TR.  - Yesterday transitioned to torsemide 80 mg twice a day. Volume status stable. Continue current dose of torsemide.  - Continue Bidil 1/2 tab tid.  - Continue Coreg 12.5 mg bid.  2. Atrial flutter/fibrillation: History of atypical flutter and atrial fibrillation.Lately has been in NSR on amiodarone 100 mg daily. She is off apixaban with recurrent severe GI bleeding.  - Continue amiodarone.  - Has been referred to Dr. Quentin Ore for evaluation for Watchman device. 3. CKD stage IV: Followed by nephrology as outpatient. Creatinine down a little 3.4>3.25 On po diuretics.  - No  current indication for HD, but concerned about our ability to effectively diurese her long-term. HD will be difficult with RV failure.  - avoid hypotension  - Nephrology following.  4. Anemia: Recurrent severe GI bleeding. She is now off Eliquis. Transferrin saturation 5% at admission.  - Received Feraheme 2/16  - As above, Watchman evaluation eventually. 5. Hypertension:stable/ improved w/ Bidil dose reduction   - avoid hypotension w/ AKI - continue on reduced Bidil 1/2 tab tid.   Consult cardiac rehab. Ambulate. ? Possible d/c  Length of Stay: 6  [name] Clegg, NP  06/29/2020, 7:35 AM  Advanced Heart Failure Team Pager [phone] (M-F; 7a - 4p)  Please contact [facility] for night-coverage after hours (4p -7a ) and weekends on [email or web address]  Patient seen with NP, agree with the above note.   No complaints this morning.  Breathing overall improved.  Good UOP yesterday.  Creatinine lower at 3.25.  Na also lower at 125.   General: NAD Neck: JVP 12 with prominent CV waves, no thyromegaly or thyroid nodule.  Lungs: Clear to auscultation bilaterally with normal respiratory effort. CV: Nondisplaced PMI.  Heart regular S1/S2, no S3/S4, 2/6 HSM LLSB.  No peripheral edema.   Abdomen: Soft, nontender, no hepatosplenomegaly, no distention.  Skin: Intact without lesions or rashes.  Neurologic: Alert and oriented x 3.  Psych: Normal affect. Extremities: No clubbing or cyanosis.  HEENT: Normal.   Seems to be doing ok on torsemide, continue 80 mg po bid.  If she has reasonable UOP today and creatinine stable, home tomorrow.   Drinking a lot of fluid and sodium level down to 125.  Fluid restrict to 1.5 L today.   Loralie Champagne 06/29/2020 8:55 AM

## 2020-06-29 NOTE — TOC Initial Note (Signed)
Transition of Care [facility]) - Initial/Assessment Note    Patient Details  Name: Morgan Clay MRN: [id number] Date of Birth: 12-04-1945  Transition of Care [facility]) CM/SW Contact:    Zenon Mayo, RN Phone Number: 06/29/2020, 9:22 AM  Clinical Narrative:                 NCM spoke with patient , she lives with her daughter, she has a shipman aide that comes on Monday and Friday from 9 to 26.  Patient states she still drives, she has a walker at home, a scale, but she does not have a pill box.  She would like for [facility] pharmacy to fill her meds before she is discharged. NCM will notify Lilia Pro with HF to bring her a pill box.  She states she does not want any HH services at this time.  Expected Discharge Plan: Home/Self Care Barriers to Discharge: Continued Medical Work up   Patient Goals and CMS Choice Patient states their goals for this hospitalization and ongoing recovery are:: get better   Choice offered to / list presented to : NA  Expected Discharge Plan and Services Expected Discharge Plan: Home/Self Care   Discharge Planning Services: CM Consult Post Acute Care Choice: NA Living arrangements for the past 2 months: Single Family Home                   DME Agency: NA       HH Arranged: NA          Prior Living Arrangements/Services Living arrangements for the past 2 months: Single Family Home Lives with:: Adult Children Patient language and need for interpreter reviewed:: Yes Do you feel safe going back to the place where you live?: Yes      Need for Family Participation in Patient Care: Yes (Comment) Care giver support system in place?: Yes (comment) Current home services: DME,Homehealth aide (she has a walker, and aide with shipmans) Criminal Activity/Legal Involvement Pertinent to Current Situation/Hospitalization: No - Comment as needed  Activities of Daily Living Home Assistive Devices/Equipment: [name] (specify type) ADL Screening (condition at time  of admission) Patient's cognitive ability adequate to safely complete daily activities?: Yes Is the patient deaf or have difficulty hearing?: No Does the patient have difficulty seeing, even when wearing glasses/contacts?: No Does the patient have difficulty concentrating, remembering, or making decisions?: No Patient able to express need for assistance with ADLs?: Yes Does the patient have difficulty dressing or bathing?: No Independently performs ADLs?: Yes (appropriate for developmental age) Does the patient have difficulty walking or climbing stairs?: No Weakness of Legs: None Weakness of Arms/Hands: None  Permission Sought/Granted                  Emotional Assessment   Attitude/Demeanor/Rapport: Engaged Affect (typically observed): Appropriate Orientation: : Oriented to Self,Oriented to Place,Oriented to  Time,Oriented to Situation Alcohol / Substance Use: Not Applicable Psych Involvement: No (comment)  Admission diagnosis:  Acute on chronic diastolic CHF (congestive heart failure) ([place]) [I50.33] Patient Active Problem List   Diagnosis Date Noted  . Angiodysplasia of small intestine ([place]) 06/10/2020  . Acute on chronic diastolic CHF (congestive heart failure) ([place])   . COVID-19 virus infection 05/27/2020  . Anemia due to chronic blood loss 04/15/2020  . Overweight (BMI 25.0-29.9) 03/02/2020  . Long term current use of anticoagulant   . Persistent atrial fibrillation ([place]) 02/06/2020  . Secondary hypercoagulable state ([place]) 02/06/2020  . CHF (congestive heart failure) ([place]) 07/23/2019  .  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 (HCV) ([place])   . Gout   . Anemia in chronic kidney disease 01/18/2018  . Pulmonary hypertension ([place]) on echocardiogram 01/14/2018  . AVM (arteriovenous malformation) of small bowel, acquired 01/14/2018  . Atrial fibrillation, chronic 10/25/2017  . Aortic atherosclerosis ([place]) 08/30/2017  . Diverticulitis of left colon status post  robotic low anterior to sigmoid resection 05/22/2018 07/31/2017  . Hypertension associated with diabetes ([place]) 06/07/2017  . MDD (major depressive disorder), recurrent, in partial remission ([place]) 06/07/2017  . Asthma 05/22/2017  . Chronic obstructive pulmonary disease ([place])   . Atypical atrial flutter ([place]) 09/28/2015  . Type 2 diabetes, controlled, with renal manifestation ([place]) 11/24/2013  . CKD (chronic kidney disease) stage 4, GFR 15-29 ml/min  11/24/2013  . Nonischemic cardiomyopathy ([place]) 11/22/2010  . Obesity 05/27/2009  . OSTEOPENIA 09/30/2008  . FIBROIDS, UTERUS 03/05/2008  . OBSTRUCTIVE SLEEP APNEA 11/14/2007  . Chronic diastolic CHF (congestive heart failure) ([place]) 04/24/2007  . Hypothyroidism 01/28/2007  . Biventricular ICD (implantable cardioverter-defibrillator) in place 01/08/2007   PCP:  Billie Ruddy, MD Pharmacy:   [facility], [address] [place] [address] Phone: [phone] Fax: [phone]     Social Determinants of Health (SDOH) Interventions    Readmission Risk Interventions Readmission Risk Prevention Plan 06/29/2020  Transportation Screening Complete  PCP or Specialist Appt within 3-5 Days (No Data)  [place] or [place] Complete  Social Work Consult for [place] Planning/Counseling [place] Not Applicable  Medication Review Press photographer) Complete  Some recent data might be hidden

## 2020-06-30 ENCOUNTER — Inpatient Hospital Stay (HOSPITAL_COMMUNITY): Payer: Medicare Other

## 2020-06-30 DIAGNOSIS — I5033 Acute on chronic diastolic (congestive) heart failure: Secondary | ICD-10-CM | POA: Diagnosis not present

## 2020-06-30 LAB — RENAL FUNCTION PANEL
Albumin: 3.8 g/dL (ref 3.5–5.0)
Anion gap: 16 — ABNORMAL HIGH (ref 5–15)
BUN: 71 mg/dL — ABNORMAL HIGH (ref 8–23)
CO2: 26 mmol/L (ref 22–32)
Calcium: 9.7 mg/dL (ref 8.9–10.3)
Chloride: 83 mmol/L — ABNORMAL LOW (ref 98–111)
Creatinine, Ser: 3.68 mg/dL — ABNORMAL HIGH (ref 0.44–1.00)
GFR, Estimated: 13 mL/min — ABNORMAL LOW (ref >60)
Glucose, Bld: 96 mg/dL (ref 70–99)
Phosphorus: 4.2 mg/dL (ref 2.5–4.6)
Potassium: 3.9 mmol/L (ref 3.5–5.1)
Sodium: 125 mmol/L — ABNORMAL LOW (ref 135–145)

## 2020-06-30 MED ORDER — CARVEDILOL 6.25 MG PO TABS
6.2500 mg | ORAL_TABLET | Freq: Two times a day (BID) | ORAL | Status: DC
  Administered 2020-06-30 – 2020-07-02 (×4): 6.25 mg via ORAL
  Filled 2020-06-30 (×4): qty 1

## 2020-06-30 MED ORDER — SODIUM CHLORIDE 0.9 % IV BOLUS
250.0000 mL | Freq: Once | INTRAVENOUS | Status: AC
  Administered 2020-06-30: 250 mL via INTRAVENOUS

## 2020-06-30 NOTE — Progress Notes (Addendum)
CARDIAC REHAB PHASE I   PRE:  Rate/Rhythm: 60 pacing    BP: sitting     SaO2:   MODE:  Ambulation: 670 ft   POST:  Rate/Rhythm: 69 pacing    BP: sitting 152/50     SaO2: 100 RA  Tolerated well with rollator. No c/o. Would benefit from dietician for renal diet. [place], ACSM 06/30/2020 2:45 PM

## 2020-06-30 NOTE — Progress Notes (Signed)
[facility] ROUNDING NOTE   Subjective:    she had 1.7 liters UOP over 2/22.  Saw [name] then Dr. Johnney Ou at [facility] in the past.  Only took trazodone once a month as an outpatient and has been on nightly here. Dizziness on standing.   Review of systems:   Denies shortness of breath or chest pain  Denies n/v   ---------- Brief history:75 year old lady with history of nonischemic cardiomyopathy, she underwent Medtronic CRT-D biventricular pacer with improved ejection fraction last 2D echo showing ejection fraction of 60-65%.  She had a Cardiolite study done that showed no evidence of any ischemia.  She has had problems with GI bleeding and has been on Eliquis for atrial flutter.  She developed a gastric ulcer and noted to have small multiple lesions on AVMs on capsule endoscopy.  She had a right-sided heart catheterization 12/27/2019 with nonobstructive coronary artery disease.  She is undergoing right sided cardiac catheterization 06/23/2020 without the use of IV contrast she has a history of diabetes is diet controlled, history of gout, history of atrial flutter, history of hepatitis C treated with Harvoni, history of small bowel obstruction and history of GI bleed.  Chronic kidney disease is being followed by [name] since she last saw [name] ( who is since retired). Her most recent hospitalization 05/27/2020 was anemia and GI bleed status post colonoscopy and 2 units packed red blood cells with discontinuation of Eliquis.  Objective:  Vital signs in last 24 hours:  Temp:  [98.3 F (36.8 C)-98.7 F (37.1 C)] 98.7 F (37.1 C) (02/23 0332) Pulse Rate:  [59-64] 64 (02/23 0900) Resp:  [16-18] 16 (02/23 0332) BP: (104-112)/(36-54) 108/49 (02/23 0900) SpO2:  [98 %-100 %] 100 % (02/23 0332) Weight:  [55.8 kg] 55.8 kg (02/23 0052)  Weight change: -4.173 kg Filed Weights   06/28/20 0218 06/29/20 0024 06/30/20 0052  Weight: 60.3 kg 60 kg 55.8 kg     Intake/Output: I/O last 3 completed shifts: In: 1200 [P.O.:1200] Out: 2850 [Urine:2850]   Intake/Output this shift:  No intake/output data recorded.  General adult female in bed in no acute distress  HEENT normocephalic atraumatic extraocular movements intact sclera anicteric Neck supple trachea midline Lungs clear to auscultation bilaterally normal work of breathing at rest  Heart regular rate and rhythm no rubs or gallops appreciated Abdomen soft nontender  nd Extremities no pitting edema  Psych normal mood and affect Neuro alert and oriented x 3 provides hx and follows commands   Basic Metabolic Panel: Recent Labs  Lab 06/26/20 0508 06/27/20 0403 06/28/20 0318 06/29/20 0341 06/30/20 0336  NA 130* 129* 129* 125* 125*  K 3.8 3.3* 3.6 3.5 3.9  CL 90* 88* 89* 86* 83*  CO2 24 24 26 27 26   GLUCOSE 86 96 116* 102* 96  BUN 38* 44* 51* 57* 71*  CREATININE 3.29* 3.24* 3.39* 3.25* 3.68*  CALCIUM 9.1 9.4 9.4 9.7 9.7  PHOS 3.1 3.2 2.6 3.3 4.2    Liver Function Tests: Recent Labs  Lab 06/23/20 1224 06/24/20 0247 06/26/20 0508 06/27/20 0403 06/28/20 0318 06/29/20 0341 06/30/20 0336  AST 19  --   --   --   --   --   --   ALT 11  --   --   --   --   --   --   ALKPHOS 88  --   --   --   --   --   --  BILITOT 0.5  --   --   --   --   --   --   PROT 8.3*  --   --   --   --   --   --   ALBUMIN 4.2   < > 3.8 3.8 3.8 3.8 3.8   < > = values in this interval not displayed.   CBC: Recent Labs  Lab 06/23/20 1224 06/24/20 0247 06/25/20 0333 06/26/20 0508 06/27/20 0403 06/28/20 0318  WBC 5.6 4.8 5.2 5.8 6.9 7.4  NEUTROABS 4.0  --   --   --   --   --   HGB 9.9* 8.5* 8.2* 8.8* 9.0* 9.1*  HCT 31.0* 25.7* 25.1* 26.8* 27.3* 28.9*  MCV 89.3 87.4 86.6 85.6 85.0 86.0  PLT 215 203 175 187 188 204    CBG: Recent Labs  Lab 06/23/20 1601 06/23/20 2124 06/24/20 0602 06/24/20 1111 06/24/20 1632  GLUCAP 167* 91 91 115* 36    Microbiology: Results for orders placed  or performed during the hospital encounter of 06/22/20  SARS CORONAVIRUS 2 (TAT 6-24 HRS) Nasopharyngeal Nasopharyngeal Swab     Status: None   Collection Time: 06/22/20 11:19 AM   Specimen: Nasopharyngeal Swab  Result Value Ref Range Status   SARS Coronavirus 2 NEGATIVE NEGATIVE Final    Comment: (NOTE) SARS-CoV-2 target nucleic acids are NOT DETECTED.  The SARS-CoV-2 RNA is generally detectable in upper and lower respiratory specimens during the acute phase of infection. Negative results do not preclude SARS-CoV-2 infection, do not rule out co-infections with other pathogens, and should not be used as the sole basis for treatment or other patient management decisions. Negative results must be combined with clinical observations, patient history, and epidemiological information. The expected result is Negative.  Fact Sheet for Patients: SugarRoll.be  Fact Sheet for Healthcare Providers: https://www.woods-mathews.com/  This test is not yet approved or cleared by the [place] FDA and  has been authorized for detection and/or diagnosis of SARS-CoV-2 by FDA under an Emergency Use Authorization (EUA). This EUA will remain  in effect (meaning this test can be used) for the duration of the COVID-19 declaration under Se ction 564(b)(1) of the Act, 21 U.S.C. section 360bbb-3(b)(1), unless the authorization is terminated or revoked sooner.  Performed at [facility], [place] [address], [place], [place] [address]    *Note: Due to a large number of results and/or encounters for the requested time period, some results have not been displayed. A complete set of results can be found in Results Review.    Coagulation Studies: No results for input(s): LABPROT, INR in the last 72 hours.  Urinalysis: No results for input(s): COLORURINE, LABSPEC, PHURINE, GLUCOSEU, HGBUR, BILIRUBINUR, KETONESUR, PROTEINUR, UROBILINOGEN, NITRITE, LEUKOCYTESUR in  the last 72 hours.  Invalid input(s): APPERANCEUR    Imaging: No results found.   Medications:   . sodium chloride    . sodium chloride     . allopurinol  100 mg Oral Daily  . amiodarone  100 mg Oral Daily  . carvedilol  6.25 mg Oral BID WC  . gabapentin  300 mg Oral BID  . heparin  5,000 Units Subcutaneous Q8H  . isosorbide-hydrALAZINE  0.5 tablet Oral TID  . levothyroxine  137 mcg Oral Q0600  . pantoprazole  40 mg Oral Daily  . sodium chloride flush  3 mL Intravenous Q12H  . traZODone  50 mg Oral QHS   sodium chloride, sodium chloride, acetaminophen, ondansetron (ZOFRAN) IV, sodium  chloride flush, traMADol  Assessment/ Plan:   CKD 4: Mild progression over time.  Baseline around 2.5 with creatinine around 3-year.  Likely cardiorenal syndrome   Adjust diuretics as below - minibolus.   Would decrease gabapentin to no more than 300 mg daily given renal function   She is at risk of worsening renal function which we have discussed but no acute need for HD  CHF: RHC on 2/16 with elevated filling pressures, RV failure and moderate pulmonary venous hypertension.    Hold PM torsemide today and give NS 250 mL bolus.  May be able to do torsemide 60-80 mg BID for discharge regimen. (home regimen 40 mg BID and admitted with CHF on same)  Hyponatremia - worsening.  Will stop trazodone as on nightly here but takes infrequently at home.  Fluid management as above as well  ANEMIA of iron deficiency and CKD-history of GI bleed will need close follow-up.  Status post IV iron.  aranesp 40 mcg once on 2/22  HTN: controlled  Hypothyroidism on replacement therapy per primary team  Nonischemic cardiomyopathy negative Cardiolite study 12/15  Diabetes mellitus diet controlled - per primary team   Atrial fibrillation not a candidate for anticoagulation secondary to bleeding per charting.  Would continue inpatient monitoring.  For planning, I have requested follow-up appt with [facility] in 1-2 weeks  Claudia Desanctis, MD 06/30/2020 10:27 AM

## 2020-06-30 NOTE — Progress Notes (Addendum)
Patient ID: Morgan Clay, female   DOB: 24-Jun-1945, 75 y.o.   MRN: [id number]     Advanced Heart Failure Rounding Note  PCP-Cardiologist: No primary care provider on file.   Subjective:    Received Torsemide x2 days. Creatinine 3.4>3.25>3.7  Denies dyspnea, slept well no orthopnea or PND.  Says she had good urine output yesterday, renal fx worse today.    Echo: EF 55-60%, moderate LVH, D-shaped interventricular septum, RV moderately dilated with low normal function, severe biatrial enlargement, severe TR.   RHC Procedural Findings (2/17): Hemodynamics (mmHg) RA mean 21 RV 49/20 PA 59/24, mean 39 PCWP mean 26 Oxygen saturations: PA 64% AO 98% Cardiac Output (Fick) 5.07  Cardiac Index (Fick) 3.23 PVR 2.6 WU Cardiac Output (Thermo) 4.72  Cardiac Index (Thermo) 3.01  PVR 2.75 WU PAPI 1.7  Objective:   Weight Range: 55.8 kg Body mass index is 24.84 kg/m.   Vital Signs:   Temp:  [97.7 F (36.5 C)-98.7 F (37.1 C)] 98.7 F (37.1 C) (02/23 0332) Pulse Rate:  [59-100] 59 (02/23 0332) Resp:  [16-20] 16 (02/23 0332) BP: (104-120)/(36-54) 104/48 (02/23 0332) SpO2:  [58 %-100 %] 100 % (02/23 0332) Weight:  [55.8 kg] 55.8 kg (02/23 0052) Last BM Date: 06/28/20  Weight change: Filed Weights   06/28/20 0218 06/29/20 0024 06/30/20 0052  Weight: 60.3 kg 60 kg 55.8 kg    Intake/Output:   Intake/Output Summary (Last 24 hours) at 06/30/2020 0740 Last data filed at 06/30/2020 7867 Gross per 24 hour  Intake 1080 ml  Output 1650 ml  Net -570 ml      Physical Exam  Cardiac: JVD flat, normal rate and rhythm, clear s1 and s2, no murmurs, rubs or gallops, no LE edema Pulmonary: CTAB, not in distress Abdominal: Distended abdomen, soft and mild diffuse tenderness Psych: Alert, conversant, in good spirits   Telemetry   A-V paced 80s   EKG    No new EKG to review today   Labs    CBC Recent Labs    06/28/20 0318  WBC 7.4  HGB 9.1*  HCT 28.9*  MCV 86.0  PLT  672   Basic Metabolic Panel Recent Labs    06/29/20 0341 06/30/20 0336  NA 125* 125*  K 3.5 3.9  CL 86* 83*  CO2 27 26  GLUCOSE 102* 96  BUN 57* 71*  CREATININE 3.25* 3.68*  CALCIUM 9.7 9.7  PHOS 3.3 4.2   Liver Function Tests Recent Labs    06/29/20 0341 06/30/20 0336  ALBUMIN 3.8 3.8   No results for input(s): LIPASE, AMYLASE in the last 72 hours. Cardiac Enzymes No results for input(s): CKTOTAL, CKMB, CKMBINDEX, TROPONINI in the last 72 hours.  BNP: BNP (last 3 results) Recent Labs    06/18/20 1300 06/21/20 1501 06/23/20 1224  BNP 1,147.8* 981.7* 1,099.2*    ProBNP (last 3 results) Recent Labs    07/17/19 1352 07/23/19 1033  PROBNP 811.0* 637.0*     D-Dimer No results for input(s): DDIMER in the last 72 hours. Hemoglobin A1C No results for input(s): HGBA1C in the last 72 hours. Fasting Lipid Panel No results for input(s): CHOL, HDL, LDLCALC, TRIG, CHOLHDL, LDLDIRECT in the last 72 hours. Thyroid Function Tests No results for input(s): TSH, T4TOTAL, T3FREE, THYROIDAB in the last 72 hours.  Invalid input(s): FREET3  Other results:   Imaging    No results found.   Medications:     Scheduled Medications: . allopurinol  100 mg Oral Daily  .  amiodarone  100 mg Oral Daily  . carvedilol  12.5 mg Oral BID WC  . gabapentin  300 mg Oral BID  . heparin  5,000 Units Subcutaneous Q8H  . isosorbide-hydrALAZINE  0.5 tablet Oral TID  . levothyroxine  137 mcg Oral Q0600  . pantoprazole  40 mg Oral Daily  . sodium chloride flush  3 mL Intravenous Q12H  . traZODone  50 mg Oral QHS    Infusions: . sodium chloride    . sodium chloride      PRN Medications: sodium chloride, sodium chloride, acetaminophen, ondansetron (ZOFRAN) IV, sodium chloride flush, traMADol    Assessment/Plan   1. Chronic primarily diastolic CHF with prominent RV dysfunction: Patient has history of presumed nonischemic cardiomyopathy, EF as low at 25-30% in 12/15.   Medtronic CRT-D device placed with improvement in LV function, echo in 4/19 showed EF improved to 60-65%. PYP scan in 3/20 was equivocal for transthyretin amyloidosis, PYP scan in 1/22 was not suggestive of transthyretin amyloidosis. LHC/RHC in 8/21 showed nonobstructive CAD, normal filling pressures and cardiac output. Echo 8/21 showed EF 50-55%, mildly decreased RV systolic function, moderate AI. NYHA class IIIb symptoms with progressive volume overload and renal failure recently.  [place] 2/16 showed elevated R>L heart filling pressures with low PAPI at 1.7 and preserved cardiac output. V/Q scan not suggestive of chronic PEs.  Decision made to admit for diuresis and monitoring of renal function. Repeat echo in hospital showed EF 55-60%, moderate LVH, D-shaped interventricular septum, RV moderately dilated with low normal function, severe biatrial enlargement, severe TR => confirming prominent RV failure.  Breathing significantly better since admission  - Hold torsemide with AKI - Continue Bidil 1/2 tab tid.  - Decrease coreg to 6.25 may improve hemodynamics with RV failure  - repeat chest x-ray  2. Atrial flutter/fibrillation: History of atypical flutter and atrial fibrillation.Lately has been in NSR on amiodarone 100 mg daily. She is off apixaban with recurrent severe GI bleeding.  - Continue amiodarone.  - Has been referred to Dr. Quentin Ore for evaluation for Watchman device. 3. CKD stage IV: Followed by nephrology as outpatient. Creatinine significantly worse today 3.25->3.7, holding diuretic - No current indication for HD, but concerned about our ability to effectively diurese her long-term. HD will be difficult with RV failure.  - avoid hypotension  - Nephrology following.  4. Anemia: Recurrent severe GI bleeding. She is now off Eliquis. Transferrin saturation 5% at admission.  - Received Feraheme 2/16  - As above, Watchman evaluation eventually. 5. Hypertension:stable/ improved w/ Bidil  dose reduction   - avoid hypotension w/ AKI - continue on reduced Bidil 1/2 tab tid.  6. Cirrhosis -check repeat US for elastography and doppler for portal venous hypertension -? portopulmonary hypertension having some contribution to RV failure or did RV failure come first.  Likely RV failure came first based on prior imaging  Length of Stay: [place], MD  06/30/2020, 7:40 AM  Advanced Heart Failure Team Pager [phone] (M-F; [place])  Please contact [facility] for night-coverage after hours (4p -7a ) and weekends on [email or web address]  Patient seen with Dr. Shan Levans, agree with the above note.   Creatinine a bit higher today.  Will hold pm Lasix and give a small bolus (250 cc).  If creatinine stable tomorrow, will start torsemide 60 mg bid and send her home.   Loralie Champagne 06/30/2020

## 2020-07-01 DIAGNOSIS — I5033 Acute on chronic diastolic (congestive) heart failure: Secondary | ICD-10-CM | POA: Diagnosis not present

## 2020-07-01 LAB — RENAL FUNCTION PANEL
Albumin: 3.8 g/dL (ref 3.5–5.0)
Anion gap: 14 (ref 5–15)
BUN: 76 mg/dL — ABNORMAL HIGH (ref 8–23)
CO2: 26 mmol/L (ref 22–32)
Calcium: 9.8 mg/dL (ref 8.9–10.3)
Chloride: 84 mmol/L — ABNORMAL LOW (ref 98–111)
Creatinine, Ser: 4 mg/dL — ABNORMAL HIGH (ref 0.44–1.00)
GFR, Estimated: 11 mL/min — ABNORMAL LOW (ref >60)
Glucose, Bld: 109 mg/dL — ABNORMAL HIGH (ref 70–99)
Phosphorus: 4.1 mg/dL (ref 2.5–4.6)
Potassium: 3.8 mmol/L (ref 3.5–5.1)
Sodium: 124 mmol/L — ABNORMAL LOW (ref 135–145)

## 2020-07-01 MED ORDER — GABAPENTIN 300 MG PO CAPS
300.0000 mg | ORAL_CAPSULE | Freq: Every day | ORAL | Status: DC
  Administered 2020-07-02 – 2020-07-12 (×11): 300 mg via ORAL
  Filled 2020-07-01 (×13): qty 1

## 2020-07-01 MED ORDER — TORSEMIDE 20 MG PO TABS
60.0000 mg | ORAL_TABLET | Freq: Two times a day (BID) | ORAL | Status: DC
  Administered 2020-07-01: 60 mg via ORAL
  Filled 2020-07-01: qty 3

## 2020-07-01 MED ORDER — UREA 15 G PO PACK
15.0000 g | PACK | Freq: Two times a day (BID) | ORAL | Status: DC
  Administered 2020-07-01 – 2020-07-02 (×3): 15 g via ORAL
  Filled 2020-07-01 (×3): qty 1

## 2020-07-01 NOTE — Progress Notes (Signed)
CARDIAC REHAB PHASE I   PRE:  Rate/Rhythm: paced 60  BP:  Supine:   Sitting: 123/50  Standing:    SaO2: 96%RA  MODE:  Ambulation: 470 ft   POST:  Rate/Rhythm: 61 paced   BP:  Supine:   Sitting: 122/50  Standing:    SaO2: 97%RA 1347-1425 Pt walked 470 ft on RA with rolling walker with steady gait and tolerated well. No DOE noted. To bed from chair after walk.    Graylon Good, RN BSN  07/01/2020 2:25 PM

## 2020-07-01 NOTE — Progress Notes (Signed)
[facility] ROUNDING NOTE   Subjective:   She had 1.7 liters UOP over 2/23 - noted both AM and PM torsemide were held.  note imaging on 1/21 suggests cirrhosis.   Review of systems:   Denies shortness of breath or chest pain  Denies n/v   ---------- Brief history:75 year old lady with history of nonischemic cardiomyopathy, she underwent Medtronic CRT-D biventricular pacer with improved ejection fraction last 2D echo showing ejection fraction of 60-65%.  She had a Cardiolite study done that showed no evidence of any ischemia.  She has had problems with GI bleeding and has been on Eliquis for atrial flutter.  She developed a gastric ulcer and noted to have small multiple lesions on AVMs on capsule endoscopy.  She had a right-sided heart catheterization 12/27/2019 with nonobstructive coronary artery disease.  She is undergoing right sided cardiac catheterization 06/23/2020 without the use of IV contrast she has a history of diabetes is diet controlled, history of gout, history of atrial flutter, history of hepatitis C treated with Harvoni, history of small bowel obstruction and history of GI bleed.  Chronic kidney disease is being followed by [name].  Saw [name] then Dr. Johnney Ou at [facility] in the past.  Her most recent hospitalization 05/27/2020 was anemia and GI bleed status post colonoscopy and 2 units packed red blood cells with discontinuation of Eliquis.  Objective:  Vital signs in last 24 hours:  Temp:  [98 F (36.7 C)-99 F (37.2 C)] 99 F (37.2 C) (02/24 0518) Pulse Rate:  [60] 60 (02/24 0518) Resp:  [14-16] 16 (02/24 0518) BP: (108-135)/(48-61) 109/51 (02/24 0518) SpO2:  [99 %-100 %] 100 % (02/24 0518) Weight:  [59.6 kg] 59.6 kg (02/24 0518)  Weight change: 3.856 kg Filed Weights   06/29/20 0024 06/30/20 0052 07/01/20 0518  Weight: 60 kg 55.8 kg 59.6 kg    Intake/Output: I/O last 3 completed shifts: In: 853.4 [P.O.:600; I.V.:3; IV  Piggyback:250.4] Out: 2600 [Urine:2600]   Intake/Output this shift:  No intake/output data recorded.  General adult female in bed in no acute distress  HEENT normocephalic atraumatic extraocular movements intact sclera anicteric Neck supple trachea midline Lungs clear to auscultation bilaterally normal work of breathing at rest  Heart regular rate and rhythm no rubs or gallops appreciated Abdomen soft nontender  nd Extremities no pitting edema  Psych normal mood and affect Neuro alert and oriented x 3 provides hx and follows commands   Basic Metabolic Panel: Recent Labs  Lab 06/27/20 0403 06/28/20 0318 06/29/20 0341 06/30/20 0336 07/01/20 0333  NA 129* 129* 125* 125* 124*  K 3.3* 3.6 3.5 3.9 3.8  CL 88* 89* 86* 83* 84*  CO2 24 26 27 26 26   GLUCOSE 96 116* 102* 96 109*  BUN 44* 51* 57* 71* 76*  CREATININE 3.24* 3.39* 3.25* 3.68* 4.00*  CALCIUM 9.4 9.4 9.7 9.7 9.8  PHOS 3.2 2.6 3.3 4.2 4.1    Liver Function Tests: Recent Labs  Lab 06/27/20 0403 06/28/20 0318 06/29/20 0341 06/30/20 0336 07/01/20 0333  ALBUMIN 3.8 3.8 3.8 3.8 3.8   CBC: Recent Labs  Lab 06/25/20 0333 06/26/20 0508 06/27/20 0403 06/28/20 0318  WBC 5.2 5.8 6.9 7.4  HGB 8.2* 8.8* 9.0* 9.1*  HCT 25.1* 26.8* 27.3* 28.9*  MCV 86.6 85.6 85.0 86.0  PLT 175 187 188 204    CBG: Recent Labs  Lab 06/24/20 1632  GLUCAP 88    Microbiology: Results for orders placed or performed during the hospital encounter  of 06/22/20  SARS CORONAVIRUS 2 (TAT 6-24 HRS) Nasopharyngeal Nasopharyngeal Swab     Status: None   Collection Time: 06/22/20 11:19 AM   Specimen: Nasopharyngeal Swab  Result Value Ref Range Status   SARS Coronavirus 2 NEGATIVE NEGATIVE Final    Comment: (NOTE) SARS-CoV-2 target nucleic acids are NOT DETECTED.  The SARS-CoV-2 RNA is generally detectable in upper and lower respiratory specimens during the acute phase of infection. Negative results do not preclude SARS-CoV-2 infection,  do not rule out co-infections with other pathogens, and should not be used as the sole basis for treatment or other patient management decisions. Negative results must be combined with clinical observations, patient history, and epidemiological information. The expected result is Negative.  Fact Sheet for Patients: SugarRoll.be  Fact Sheet for Healthcare Providers: https://www.woods-mathews.com/  This test is not yet approved or cleared by the [place] FDA and  has been authorized for detection and/or diagnosis of SARS-CoV-2 by FDA under an Emergency Use Authorization (EUA). This EUA will remain  in effect (meaning this test can be used) for the duration of the COVID-19 declaration under Se ction 564(b)(1) of the Act, 21 U.S.C. section 360bbb-3(b)(1), unless the authorization is terminated or revoked sooner.  Performed at [facility], [place] [address] [place], [place] [address]    *Note: Due to a large number of results and/or encounters for the requested time period, some results have not been displayed. A complete set of results can be found in Results Review.    Imaging: DG Chest Port 1 View  Result Date: 06/30/2020 CLINICAL DATA:  Shortness of breath EXAM: PORTABLE CHEST 1 VIEW COMPARISON:  June 23, 2020 FINDINGS: There is persistent ill-defined opacity left lower lobe. Right lung is now clear. Cardiac enlargement is stable with pulmonary vascularity within normal limits. Pacemaker leads are attached to the right atrium, right ventricle, and coronary sinus. No adenopathy. There is aortic atherosclerosis. No bone lesions. IMPRESSION: Persistent suspected pneumonia left lower lobe. Right lung now clear. No new areas of opacity compared to prior study. Stable cardiac enlargement. Stable pacemaker lead placements. Aortic Atherosclerosis (ICD10-I70.0). Electronically Signed   By: Lowella Grip III M.D.   On: 06/30/2020 11:02    US LIVER DOPPLER  Result Date: 07/01/2020 CLINICAL DATA:  Cirrhosis EXAM: DUPLEX ULTRASOUND OF LIVER TECHNIQUE: Color and duplex Doppler ultrasound was performed to evaluate the hepatic in-flow and out-flow vessels. COMPARISON:  05/28/2020 CT without contrast FINDINGS: Liver: Mildly heterogeneous parenchyma. No significant surface nodularity No focal lesion, mass or intrahepatic biliary ductal dilatation. Main Portal Vein size: 1.0 cm Portal Vein Velocities Main Prox:  18 cm/sec Main Mid: 17 cm/sec Main Dist:  11 cm/sec Right: 13 cm/sec Left: 10 cm/sec Hepatic Vein Velocities Right:  33 cm/sec Middle:  22 cm/sec Left:  20 cm/sec IVC: Present and patent with normal respiratory phasicity. Hepatic Artery Velocity:  58 cm/sec Splenic Vein Velocity:  11 cm/sec Spleen: 8.0 cm x 4.9 cm x 3.0 cm with a total volume of 62 cm^3 (411 cm^3 is upper limit normal) Portal Vein Occlusion/Thrombus: No Splenic Vein Occlusion/Thrombus: No Ascites: None Varices: None Patent portal, hepatic and splenic veins with normal directional flow. Negative for portal vein occlusion or thrombus. No free fluid or ascites IMPRESSION: Normal hepatic venous Doppler for age. Electronically Signed   By: Jerilynn Mages.  Shick M.D.   On: 07/01/2020 08:40   US Abdomen Limited RUQ (LIVER/GB)  Result Date: 06/30/2020 CLINICAL DATA:  Cirrhosis EXAM: ULTRASOUND ABDOMEN LIMITED RIGHT UPPER  QUADRANT COMPARISON:  05/28/2020 FINDINGS: Gallbladder: No gallstones or wall thickening visualized. No sonographic Murphy sign noted by sonographer. Common bile duct: Diameter: 3 mm Liver: Liver is mildly heterogeneous in echotexture consistent with given history of cirrhosis. No focal abnormalities. No biliary duct dilation. Portal vein is patent on color Doppler imaging with normal direction of blood flow towards the liver. Other: None. IMPRESSION: 1. Heterogeneous liver echotexture consistent with known history of cirrhosis. 2. Otherwise unremarkable exam. Electronically  Signed   By: Randa Ngo M.D.   On: 06/30/2020 21:02     Medications:   . sodium chloride    . sodium chloride     . allopurinol  100 mg Oral Daily  . amiodarone  100 mg Oral Daily  . carvedilol  6.25 mg Oral BID WC  . gabapentin  300 mg Oral BID  . heparin  5,000 Units Subcutaneous Q8H  . isosorbide-hydrALAZINE  0.5 tablet Oral TID  . levothyroxine  137 mcg Oral Q0600  . pantoprazole  40 mg Oral Daily  . sodium chloride flush  3 mL Intravenous Q12H   sodium chloride, sodium chloride, acetaminophen, ondansetron (ZOFRAN) IV, sodium chloride flush, traMADol  Assessment/ Plan:   CKD 4: Mild progression over time.  Baseline around 2.5 with creatinine around 3-year.  Likely cardiorenal syndrome   Torsemide 60 mg BID suggested per CHF per notes - agree   Would please decrease gabapentin to no more than 300 mg daily given renal function   She is at risk of worsening renal function which we have discussed but no acute need for HD  CHF: RHC on 2/16 with elevated filling pressures, RV failure and moderate pulmonary venous hypertension.    Consider torsemide 60-80 mg BID for discharge regimen. (home regimen 40 mg BID and admitted with CHF on same)  Hyponatremia - worsening.  Stopped trazodone. Adjusted diuretics - held on 2/23 with no improvement.  Unfortunately, not a candidate for tolvaptan with liver disease.  Trial of urea packet.   ANEMIA of iron deficiency and CKD-history of GI bleed will need close follow-up.  Status post IV iron.  aranesp 40 mcg once on 2/22  HTN: controlled  Hypothyroidism on replacement therapy per primary team  Nonischemic cardiomyopathy negative Cardiolite study 12/15  Diabetes mellitus diet controlled - per primary team   Atrial fibrillation not a candidate for anticoagulation secondary to bleeding per charting.  Given hyponatremia please continue inpatient monitoring.  Concerned about her long-term success on this regimen.  Note for planning,  earlier I had requested follow-up appt with [facility] in 1-2 weeks  Claudia Desanctis, MD 07/01/2020 11:45 AM

## 2020-07-01 NOTE — Progress Notes (Addendum)
Patient ID: Morgan Clay, female   DOB: [birth year], 75 y.o.   MRN: [id number]     Advanced Heart Failure Rounding Note  PCP-Cardiologist: No primary care provider on file.   Subjective:    Received Torsemide x2 days. AM and PM dose of torsemide held yesterday and given 250cc IVF.  Creatinine 3.4>3.25>3.7>4.0.  Lungs remain clear, IVC collapsible on US Liver imaging late yesterday evening.    Denies Dyspnea, orthopnea or PND.     Echo: EF 55-60%, moderate LVH, D-shaped interventricular septum, RV moderately dilated with low normal function, severe biatrial enlargement, severe TR.   RHC Procedural Findings (2/17): Hemodynamics (mmHg) RA mean 21 RV 49/20 PA 59/24, mean 39 PCWP mean 26 Oxygen saturations: PA 64% AO 98% Cardiac Output (Fick) 5.07  Cardiac Index (Fick) 3.23 PVR 2.6 WU Cardiac Output (Thermo) 4.72  Cardiac Index (Thermo) 3.01  PVR 2.75 WU PAPI 1.7  Objective:   Weight Range: 59.6 kg Body mass index is 26.56 kg/m.   Vital Signs:   Temp:  [98 F (36.7 C)-99 F (37.2 C)] 99 F (37.2 C) (02/24 0518) Pulse Rate:  [60] 60 (02/24 0518) Resp:  [14-16] 16 (02/24 0518) BP: (108-135)/(48-61) 109/51 (02/24 0518) SpO2:  [99 %-100 %] 100 % (02/24 0518) Weight:  [59.6 kg] 59.6 kg (02/24 0518) Last BM Date: 06/30/20  Weight change: Filed Weights   06/29/20 0024 06/30/20 0052 07/01/20 0518  Weight: 60 kg 55.8 kg 59.6 kg    Intake/Output:   Intake/Output Summary (Last 24 hours) at 07/01/2020 0903 Last data filed at 07/01/2020 0500 Gross per 24 hour  Intake 493.41 ml  Output 1700 ml  Net -1206.59 ml      Physical Exam  Cardiac: JVD 10, normal rate and rhythm, clear s1 and s2, 2/6 TR, no rubs or gallops, no LE edema Pulmonary: CTAB, not in distress Abdominal: Distended abdomen, soft and mild diffuse tenderness Psych: Alert, conversant, in good spirits   Telemetry   A-V paced 60s   EKG    No new EKG to review today   Labs    CBC No results  for input(s): WBC, NEUTROABS, HGB, HCT, MCV, PLT in the last 72 hours. Basic Metabolic Panel Recent Labs    06/30/20 0336 07/01/20 0333  NA 125* 124*  K 3.9 3.8  CL 83* 84*  CO2 26 26  GLUCOSE 96 109*  BUN 71* 76*  CREATININE 3.68* 4.00*  CALCIUM 9.7 9.8  PHOS 4.2 4.1   Liver Function Tests Recent Labs    06/30/20 0336 07/01/20 0333  ALBUMIN 3.8 3.8   No results for input(s): LIPASE, AMYLASE in the last 72 hours. Cardiac Enzymes No results for input(s): CKTOTAL, CKMB, CKMBINDEX, TROPONINI in the last 72 hours.  BNP: BNP (last 3 results) Recent Labs    06/18/20 1300 06/21/20 1501 06/23/20 1224  BNP 1,147.8* 981.7* 1,099.2*    ProBNP (last 3 results) Recent Labs    07/17/19 1352 07/23/19 1033  PROBNP 811.0* 637.0*     D-Dimer No results for input(s): DDIMER in the last 72 hours. Hemoglobin A1C No results for input(s): HGBA1C in the last 72 hours. Fasting Lipid Panel No results for input(s): CHOL, HDL, LDLCALC, TRIG, CHOLHDL, LDLDIRECT in the last 72 hours. Thyroid Function Tests No results for input(s): TSH, T4TOTAL, T3FREE, THYROIDAB in the last 72 hours.  Invalid input(s): FREET3  Other results:   Imaging    DG Chest Port 1 View  Result Date: 06/30/2020 CLINICAL DATA:  Shortness of  breath EXAM: PORTABLE CHEST 1 VIEW COMPARISON:  June 23, 2020 FINDINGS: There is persistent ill-defined opacity left lower lobe. Right lung is now clear. Cardiac enlargement is stable with pulmonary vascularity within normal limits. Pacemaker leads are attached to the right atrium, right ventricle, and coronary sinus. No adenopathy. There is aortic atherosclerosis. No bone lesions. IMPRESSION: Persistent suspected pneumonia left lower lobe. Right lung now clear. No new areas of opacity compared to prior study. Stable cardiac enlargement. Stable pacemaker lead placements. Aortic Atherosclerosis (ICD10-I70.0). Electronically Signed   By: Lowella Grip III M.D.   On:  06/30/2020 11:02   US LIVER DOPPLER  Result Date: 07/01/2020 CLINICAL DATA:  Cirrhosis EXAM: DUPLEX ULTRASOUND OF LIVER TECHNIQUE: Color and duplex Doppler ultrasound was performed to evaluate the hepatic in-flow and out-flow vessels. COMPARISON:  05/28/2020 CT without contrast FINDINGS: Liver: Mildly heterogeneous parenchyma. No significant surface nodularity No focal lesion, mass or intrahepatic biliary ductal dilatation. Main Portal Vein size: 1.0 cm Portal Vein Velocities Main Prox:  18 cm/sec Main Mid: 17 cm/sec Main Dist:  11 cm/sec Right: 13 cm/sec Left: 10 cm/sec Hepatic Vein Velocities Right:  33 cm/sec Middle:  22 cm/sec Left:  20 cm/sec IVC: Present and patent with normal respiratory phasicity. Hepatic Artery Velocity:  58 cm/sec Splenic Vein Velocity:  11 cm/sec Spleen: 8.0 cm x 4.9 cm x 3.0 cm with a total volume of 62 cm^3 (411 cm^3 is upper limit normal) Portal Vein Occlusion/Thrombus: No Splenic Vein Occlusion/Thrombus: No Ascites: None Varices: None Patent portal, hepatic and splenic veins with normal directional flow. Negative for portal vein occlusion or thrombus. No free fluid or ascites IMPRESSION: Normal hepatic venous Doppler for age. Electronically Signed   By: Jerilynn Mages.  Shick M.D.   On: 07/01/2020 08:40   US Abdomen Limited RUQ (LIVER/GB)  Result Date: 06/30/2020 CLINICAL DATA:  Cirrhosis EXAM: ULTRASOUND ABDOMEN LIMITED RIGHT UPPER QUADRANT COMPARISON:  05/28/2020 FINDINGS: Gallbladder: No gallstones or wall thickening visualized. No sonographic Murphy sign noted by sonographer. Common bile duct: Diameter: 3 mm Liver: Liver is mildly heterogeneous in echotexture consistent with given history of cirrhosis. No focal abnormalities. No biliary duct dilation. Portal vein is patent on color Doppler imaging with normal direction of blood flow towards the liver. Other: None. IMPRESSION: 1. Heterogeneous liver echotexture consistent with known history of cirrhosis. 2. Otherwise unremarkable exam.  Electronically Signed   By: Randa Ngo M.D.   On: 06/30/2020 21:02     Medications:     Scheduled Medications: . allopurinol  100 mg Oral Daily  . amiodarone  100 mg Oral Daily  . carvedilol  6.25 mg Oral BID WC  . gabapentin  300 mg Oral BID  . heparin  5,000 Units Subcutaneous Q8H  . isosorbide-hydrALAZINE  0.5 tablet Oral TID  . levothyroxine  137 mcg Oral Q0600  . pantoprazole  40 mg Oral Daily  . sodium chloride flush  3 mL Intravenous Q12H    Infusions: . sodium chloride    . sodium chloride      PRN Medications: sodium chloride, sodium chloride, acetaminophen, ondansetron (ZOFRAN) IV, sodium chloride flush, traMADol    Assessment/Plan   1. Chronic primarily diastolic CHF with prominent RV dysfunction: Patient has history of presumed nonischemic cardiomyopathy, EF as low at 25-30% in 12/15.  Medtronic CRT-D device placed with improvement in LV function, echo in 4/19 showed EF improved to 60-65%. PYP scan in 3/20 was equivocal for transthyretin amyloidosis, PYP scan in 1/22 was not suggestive of transthyretin amyloidosis.  LHC/RHC in 8/21 showed nonobstructive CAD, normal filling pressures and cardiac output. Echo 8/21 showed EF 50-55%, mildly decreased RV systolic function, moderate AI. NYHA class IIIb symptoms with progressive volume overload and renal failure recently.  [place] 2/16 showed elevated R>L heart filling pressures with low PAPI at 1.7 and preserved cardiac output. V/Q scan not suggestive of chronic PEs.  Decision made to admit for diuresis and monitoring of renal function. Repeat echo in hospital showed EF 55-60%, moderate LVH, D-shaped interventricular septum, RV moderately dilated with low normal function, severe biatrial enlargement, severe TR => confirming prominent RV failure.  Breathing significantly better since admission  - Torsemide restarted today 60mg  BID - Continue Bidil 1/2 tab tid.  - continue coreg to 6.25  2. Atrial flutter/fibrillation:  History of atypical flutter and atrial fibrillation.Lately has been in NSR on amiodarone 100 mg daily. She is off apixaban with recurrent severe GI bleeding.  - Continue amiodarone.  - Has been referred to Dr. Quentin Ore for evaluation for Watchman device. 3. CKD stage IV: Followed by nephrology as outpatient. Creatinine slightly worse today 3.7>4.0, holding diuretic - No current indication for HD, but concerned about our ability to effectively diurese her long-term. HD will be difficult with RV failure.  - avoid hypotension  - Nephrology following.  4. Anemia: Recurrent severe GI bleeding. She is now off Eliquis. Transferrin saturation 5% at admission.  - Received Feraheme 2/16  - As above, Watchman evaluation eventually. 5. Hypertension:stable/ improved w/ Bidil dose reduction   - avoid hypotension w/ AKI - continue on reduced Bidil 1/2 tab tid.  6. Cirrhosis -was not able to order elastography inpatient but US shows stable, compensated cirrhosis with normal doppler studies with no ascites 7. Hyponatremia -Not a candidate for tolvaptan due to liver disease, nephrology following  Length of Stay: 8  Katherine Roan, MD  07/01/2020, 9:03 AM  Advanced Heart Failure Team Pager [phone] (M-F; [place])  Please contact [facility] for night-coverage after hours (4p -7a ) and weekends on [email or web address]  Patient seen with Dr. Shan Levans, agree with the above note.   Creatinine up to 4 today and Na down to 124.  She denies dyspnea.   General: NAD Neck: JVP 8-9 cm, no thyromegaly or thyroid nodule.  Lungs: Clear to auscultation bilaterally with normal respiratory effort. CV: Nondisplaced PMI.  Heart regular S1/S2, no S3/S4, 2/6 HSM LLSB.  No peripheral edema.   Abdomen: Soft, nontender, no hepatosplenomegaly, no distention.  Skin: Intact without lesions or rashes.  Neurologic: Alert and oriented x 3.  Psych: Normal affect. Extremities: No clubbing or cyanosis.  HEENT: Normal.    Unfortunately, with cirrhosis, she is not a candidate for tolvaptan.   - Continue fluid restriction, 1200 cc.   Volume looks mildly elevated at most.  Per Dr. Shan Levans, IVC collapsible on US imaging suggesting she is not markedly volume overloaded.  JVP will be difficult with severe TR.  - Will give 1 dose of torsemide 60 mg today, if creatinine has stabilized will increase to bid tomorrow.  - Appreciate nephrology assistance, worry that she will need HD in the near future.   Loralie Champagne 07/01/2020 2:49 PM

## 2020-07-01 NOTE — Progress Notes (Signed)
1115 Offered to walk with pt. Stated she is resting and will walk with Mobility specialist after lunch.  Asked if I would check back around two. Will continue to follow. Graylon Good RN BSN 07/01/2020 11:26 AM

## 2020-07-02 DIAGNOSIS — N179 Acute kidney failure, unspecified: Secondary | ICD-10-CM | POA: Diagnosis not present

## 2020-07-02 DIAGNOSIS — I5033 Acute on chronic diastolic (congestive) heart failure: Secondary | ICD-10-CM | POA: Diagnosis not present

## 2020-07-02 LAB — RENAL FUNCTION PANEL
Albumin: 3.8 g/dL (ref 3.5–5.0)
Anion gap: 13 (ref 5–15)
BUN: 116 mg/dL — ABNORMAL HIGH (ref 8–23)
CO2: 26 mmol/L (ref 22–32)
Calcium: 9.9 mg/dL (ref 8.9–10.3)
Chloride: 87 mmol/L — ABNORMAL LOW (ref 98–111)
Creatinine, Ser: 4.35 mg/dL — ABNORMAL HIGH (ref 0.44–1.00)
GFR, Estimated: 10 mL/min — ABNORMAL LOW (ref >60)
Glucose, Bld: 105 mg/dL — ABNORMAL HIGH (ref 70–99)
Phosphorus: 3.5 mg/dL (ref 2.5–4.6)
Potassium: 4.1 mmol/L (ref 3.5–5.1)
Sodium: 126 mmol/L — ABNORMAL LOW (ref 135–145)

## 2020-07-02 MED ORDER — TORSEMIDE 20 MG PO TABS
60.0000 mg | ORAL_TABLET | Freq: Two times a day (BID) | ORAL | Status: DC
  Administered 2020-07-02 – 2020-07-03 (×3): 60 mg via ORAL
  Filled 2020-07-02 (×3): qty 3

## 2020-07-02 MED ORDER — CARVEDILOL 3.125 MG PO TABS
3.1250 mg | ORAL_TABLET | Freq: Two times a day (BID) | ORAL | Status: DC
  Administered 2020-07-02 – 2020-07-12 (×16): 3.125 mg via ORAL
  Filled 2020-07-02 (×16): qty 1

## 2020-07-02 NOTE — Care Management Important Message (Signed)
Important Message  Patient Details  Name: Lareta Bruneau MRN: [id number] Date of Birth: [birth year]   Medicare Important Message Given:  Yes     Shelda Altes 07/02/2020, 11:24 AM

## 2020-07-02 NOTE — Progress Notes (Signed)
  Mobility Specialist Criteria Algorithm Info.   Mobility Team: [facility] elevated:Self regulated Activity: Ambulated in hall; Transferred:  Bed to chair Range of motion: Active; All extremities Level of assistance: Independent after set-up Assistive device: Front wheel walker Minutes sitting in chair:  Minutes stood: 5 minutes Minutes ambulated: 5 minutes Distance ambulated (ft): 470 ft Mobility response: Tolerated well Bed Position: Chair  Patient is progressing well with mobility and is feeling better. Ambulated in hallway without walker 500 feet independently with steady gait. Required standing rest x1, was fatigued but declined SOB. Tolerated ambulation well without complaints and is now dangling EOB with all needs met.   07/02/2020 12:47 PM

## 2020-07-02 NOTE — Progress Notes (Signed)
[facility] ROUNDING NOTE   Subjective:   She had just 700 mL uop over 2/24.  She would want to try whatever is needed to keep her alive - she would want dialysis when needed.  Discussed thoughtfully today that we are near need.    Review of systems:   Denies shortness of breath or chest pain  Denies n/v   ---------- Brief history:75 year old lady with history of nonischemic cardiomyopathy, she underwent Medtronic CRT-D biventricular pacer with improved ejection fraction last 2D echo showing ejection fraction of 60-65%.  She had a Cardiolite study done that showed no evidence of any ischemia.  She has had problems with GI bleeding and has been on Eliquis for atrial flutter.  She developed a gastric ulcer and noted to have small multiple lesions on AVMs on capsule endoscopy.  She had a right-sided heart catheterization 12/27/2019 with nonobstructive coronary artery disease.  She is undergoing right sided cardiac catheterization 06/23/2020 without the use of IV contrast she has a history of diabetes is diet controlled, history of gout, history of atrial flutter, history of hepatitis C treated with Harvoni, history of small bowel obstruction and history of GI bleed.  Chronic kidney disease is being followed by [name].  Saw [name] then Dr. Johnney Ou at [facility] in the past.  Her most recent hospitalization 05/27/2020 was anemia and GI bleed status post colonoscopy and 2 units packed red blood cells with discontinuation of Eliquis.  Objective:  Vital signs in last 24 hours:  Temp:  [98.7 F (37.1 C)-98.9 F (37.2 C)] 98.7 F (37.1 C) (02/25 0313) Pulse Rate:  [60-80] 80 (02/25 0313) Resp:  [16-18] 16 (02/25 0313) BP: (105-131)/(46-58) 105/46 (02/25 0313) SpO2:  [90 %-100 %] 90 % (02/25 0313) Weight:  [60 kg] 60 kg (02/25 0313)  Weight change: 0.318 kg Filed Weights   06/30/20 0052 07/01/20 0518 07/02/20 0313  Weight: 55.8 kg 59.6 kg 60 kg    Intake/Output: I/O  last 3 completed shifts: In: 720 [P.O.:720] Out: 1600 [Urine:1600]   Intake/Output this shift:  Total I/O In: 220 [P.O.:220] Out: -   General adult female in bed in no acute distress   HEENT normocephalic atraumatic extraocular movements intact sclera anicteric Neck supple trachea midline Lungs clear to auscultation bilaterally normal work of breathing at rest  Heart regular rate and rhythm no rubs or gallops appreciated Abdomen soft nontender  nd Extremities no pitting edema  Psych normal mood and affect Neuro alert and oriented x 3 provides hx and follows commands   Basic Metabolic Panel: Recent Labs  Lab 06/28/20 0318 06/29/20 0341 06/30/20 0336 07/01/20 0333 07/02/20 0309  NA 129* 125* 125* 124* 126*  K 3.6 3.5 3.9 3.8 4.1  CL 89* 86* 83* 84* 87*  CO2 26 27 26 26 26   GLUCOSE 116* 102* 96 109* 105*  BUN 51* 57* 71* 76* 116*  CREATININE 3.39* 3.25* 3.68* 4.00* 4.35*  CALCIUM 9.4 9.7 9.7 9.8 9.9  PHOS 2.6 3.3 4.2 4.1 3.5    Liver Function Tests: Recent Labs  Lab 06/28/20 0318 06/29/20 0341 06/30/20 0336 07/01/20 0333 07/02/20 0309  ALBUMIN 3.8 3.8 3.8 3.8 3.8   CBC: Recent Labs  Lab 06/26/20 0508 06/27/20 0403 06/28/20 0318  WBC 5.8 6.9 7.4  HGB 8.8* 9.0* 9.1*  HCT 26.8* 27.3* 28.9*  MCV 85.6 85.0 86.0  PLT 187 188 204    Microbiology: Results for orders placed or performed during the hospital encounter of 06/22/20  SARS CORONAVIRUS 2 (TAT 6-24 HRS) Nasopharyngeal Nasopharyngeal Swab     Status: None   Collection Time: 06/22/20 11:19 AM   Specimen: Nasopharyngeal Swab  Result Value Ref Range Status   SARS Coronavirus 2 NEGATIVE NEGATIVE Final    Comment: (NOTE) SARS-CoV-2 target nucleic acids are NOT DETECTED.  The SARS-CoV-2 RNA is generally detectable in upper and lower respiratory specimens during the acute phase of infection. Negative results do not preclude SARS-CoV-2 infection, do not rule out co-infections with other pathogens, and  should not be used as the sole basis for treatment or other patient management decisions. Negative results must be combined with clinical observations, patient history, and epidemiological information. The expected result is Negative.  Fact Sheet for Patients: SugarRoll.be  Fact Sheet for Healthcare Providers: https://www.woods-mathews.com/  This test is not yet approved or cleared by the [place] FDA and  has been authorized for detection and/or diagnosis of SARS-CoV-2 by FDA under an Emergency Use Authorization (EUA). This EUA will remain  in effect (meaning this test can be used) for the duration of the COVID-19 declaration under Se ction 564(b)(1) of the Act, 21 U.S.C. section 360bbb-3(b)(1), unless the authorization is terminated or revoked sooner.  Performed at [facility], [place] [address], [place], [place] [address]    *Note: Due to a large number of results and/or encounters for the requested time period, some results have not been displayed. A complete set of results can be found in Results Review.    Imaging: US LIVER DOPPLER  Result Date: 07/01/2020 CLINICAL DATA:  Cirrhosis EXAM: DUPLEX ULTRASOUND OF LIVER TECHNIQUE: Color and duplex Doppler ultrasound was performed to evaluate the hepatic in-flow and out-flow vessels. COMPARISON:  05/28/2020 CT without contrast FINDINGS: Liver: Mildly heterogeneous parenchyma. No significant surface nodularity No focal lesion, mass or intrahepatic biliary ductal dilatation. Main Portal Vein size: 1.0 cm Portal Vein Velocities Main Prox:  18 cm/sec Main Mid: 17 cm/sec Main Dist:  11 cm/sec Right: 13 cm/sec Left: 10 cm/sec Hepatic Vein Velocities Right:  33 cm/sec Middle:  22 cm/sec Left:  20 cm/sec IVC: Present and patent with normal respiratory phasicity. Hepatic Artery Velocity:  58 cm/sec Splenic Vein Velocity:  11 cm/sec Spleen: 8.0 cm x 4.9 cm x 3.0 cm with a total volume of 62 cm^3  (411 cm^3 is upper limit normal) Portal Vein Occlusion/Thrombus: No Splenic Vein Occlusion/Thrombus: No Ascites: None Varices: None Patent portal, hepatic and splenic veins with normal directional flow. Negative for portal vein occlusion or thrombus. No free fluid or ascites IMPRESSION: Normal hepatic venous Doppler for age. Electronically Signed   By: Jerilynn Mages.  Shick M.D.   On: 07/01/2020 08:40   US Abdomen Limited RUQ (LIVER/GB)  Result Date: 06/30/2020 CLINICAL DATA:  Cirrhosis EXAM: ULTRASOUND ABDOMEN LIMITED RIGHT UPPER QUADRANT COMPARISON:  05/28/2020 FINDINGS: Gallbladder: No gallstones or wall thickening visualized. No sonographic Murphy sign noted by sonographer. Common bile duct: Diameter: 3 mm Liver: Liver is mildly heterogeneous in echotexture consistent with given history of cirrhosis. No focal abnormalities. No biliary duct dilation. Portal vein is patent on color Doppler imaging with normal direction of blood flow towards the liver. Other: None. IMPRESSION: 1. Heterogeneous liver echotexture consistent with known history of cirrhosis. 2. Otherwise unremarkable exam. Electronically Signed   By: Randa Ngo M.D.   On: 06/30/2020 21:02     Medications:   . sodium chloride    . sodium chloride     . allopurinol  100 mg Oral Daily  .  amiodarone  100 mg Oral Daily  . carvedilol  3.125 mg Oral BID WC  . gabapentin  300 mg Oral Daily  . heparin  5,000 Units Subcutaneous Q8H  . isosorbide-hydrALAZINE  0.5 tablet Oral TID  . levothyroxine  137 mcg Oral Q0600  . pantoprazole  40 mg Oral Daily  . sodium chloride flush  3 mL Intravenous Q12H   sodium chloride, sodium chloride, acetaminophen, ondansetron (ZOFRAN) IV, sodium chloride flush, traMADol  Assessment/ Plan:   CKD 4: Mild progression over time.  Baseline around 2.5 with creatinine around 3-year.  Likely cardiorenal syndrome   Increase torsemide back to 60 mg BID   Renal panel in am   Stopped urea so as not to confound - though  wouldn't expect such a jump from two doses between the day's labs   We are unfortunately likely near the need for HD - discussed with patient.  Have stopped urea and assess am labs.   CHF: RHC on 2/16 with elevated filling pressures, RV failure and moderate pulmonary venous hypertension.    Note home regimen 40 mg BID and admitted with CHF on same  Diuretics as above   Hyponatremia - a little better. Stopped trazodone. Adjusted diuretics.  Unfortunately, not a candidate for tolvaptan with liver disease.  S/p urea - now off.   ANEMIA of iron deficiency and CKD-history of GI bleed will need close follow-up.  Status post IV iron.  aranesp 40 mcg once on 2/22. CBC in am  HTN: controlled  Hypothyroidism on replacement therapy per primary team  Nonischemic cardiomyopathy negative Cardiolite study 12/15  Diabetes mellitus diet controlled - per primary team   Atrial fibrillation not a candidate for anticoagulation secondary to bleeding per charting.  Please continue inpatient monitoring - watching for need for HD.   Claudia Desanctis, MD 07/02/2020 11:37 AM

## 2020-07-02 NOTE — Progress Notes (Addendum)
Patient ID: Morgan Clay, female   DOB: [birth year], 75 y.o.   MRN: [id number]     Advanced Heart Failure Rounding Note  PCP-Cardiologist: No primary care provider on file.   Subjective:    Restarted on torsemide yesterday received one dose 60mg .  Creatinine 3.4>3.25>3.7>4.0>4.35. BUN now markedly elevated at 116  Lungs remain clear, some tremor in right hand, no clear asterixis, not confused, no nausea or change in taste, says she feels good, she is eating breakfast. Denies Dyspnea, orthopnea or PND.     Echo: EF 55-60%, moderate LVH, D-shaped interventricular septum, RV moderately dilated with low normal function, severe biatrial enlargement, severe TR.   RHC Procedural Findings (2/17): Hemodynamics (mmHg) RA mean 21 RV 49/20 PA 59/24, mean 39 PCWP mean 26 Oxygen saturations: PA 64% AO 98% Cardiac Output (Fick) 5.07  Cardiac Index (Fick) 3.23 PVR 2.6 WU Cardiac Output (Thermo) 4.72  Cardiac Index (Thermo) 3.01  PVR 2.75 WU PAPI 1.7  Objective:   Weight Range: 60 kg Body mass index is 26.7 kg/m.   Vital Signs:   Temp:  [98.7 F (37.1 C)-98.9 F (37.2 C)] 98.7 F (37.1 C) (02/25 0313) Pulse Rate:  [60-80] 80 (02/25 0313) Resp:  [16-18] 16 (02/25 0313) BP: (105-131)/(46-58) 105/46 (02/25 0313) SpO2:  [90 %-100 %] 90 % (02/25 0313) Weight:  [60 kg] 60 kg (02/25 0313) Last BM Date: 06/30/20  Weight change: Filed Weights   06/30/20 0052 07/01/20 0518 07/02/20 0313  Weight: 55.8 kg 59.6 kg 60 kg    Intake/Output:   Intake/Output Summary (Last 24 hours) at 07/02/2020 0852 Last data filed at 07/02/2020 0852 Gross per 24 hour  Intake 700 ml  Output 700 ml  Net 0 ml      Physical Exam  Cardiac: JVD 10, normal rate and rhythm, clear s1 and s2, 2/6 TR, no rubs or gallops, no LE edema Pulmonary: CTAB, not in distress Abdominal:  soft and mild tenderness on the right upper quadrant Psych: Alert, conversant, in good spirits   Telemetry   A-V paced 60s    EKG    No new EKG to review today   Labs    CBC No results for input(s): WBC, NEUTROABS, HGB, HCT, MCV, PLT in the last 72 hours. Basic Metabolic Panel Recent Labs    07/01/20 0333 07/02/20 0309  NA 124* 126*  K 3.8 4.1  CL 84* 87*  CO2 26 26  GLUCOSE 109* 105*  BUN 76* 116*  CREATININE 4.00* 4.35*  CALCIUM 9.8 9.9  PHOS 4.1 3.5   Liver Function Tests Recent Labs    07/01/20 0333 07/02/20 0309  ALBUMIN 3.8 3.8   No results for input(s): LIPASE, AMYLASE in the last 72 hours. Cardiac Enzymes No results for input(s): CKTOTAL, CKMB, CKMBINDEX, TROPONINI in the last 72 hours.  BNP: BNP (last 3 results) Recent Labs    06/18/20 1300 06/21/20 1501 06/23/20 1224  BNP 1,147.8* 981.7* 1,099.2*    ProBNP (last 3 results) Recent Labs    07/17/19 1352 07/23/19 1033  PROBNP 811.0* 637.0*     D-Dimer No results for input(s): DDIMER in the last 72 hours. Hemoglobin A1C No results for input(s): HGBA1C in the last 72 hours. Fasting Lipid Panel No results for input(s): CHOL, HDL, LDLCALC, TRIG, CHOLHDL, LDLDIRECT in the last 72 hours. Thyroid Function Tests No results for input(s): TSH, T4TOTAL, T3FREE, THYROIDAB in the last 72 hours.  Invalid input(s): FREET3  Other results:   Imaging    No  results found.   Medications:     Scheduled Medications: . allopurinol  100 mg Oral Daily  . amiodarone  100 mg Oral Daily  . carvedilol  6.25 mg Oral BID WC  . gabapentin  300 mg Oral Daily  . heparin  5,000 Units Subcutaneous Q8H  . isosorbide-hydrALAZINE  0.5 tablet Oral TID  . levothyroxine  137 mcg Oral Q0600  . pantoprazole  40 mg Oral Daily  . sodium chloride flush  3 mL Intravenous Q12H  . urea  15 g Oral BID    Infusions: . sodium chloride    . sodium chloride      PRN Medications: sodium chloride, sodium chloride, acetaminophen, ondansetron (ZOFRAN) IV, sodium chloride flush, traMADol    Assessment/Plan   1. Chronic primarily diastolic  CHF with prominent RV dysfunction: Patient has history of presumed nonischemic cardiomyopathy, EF as low at 25-30% in 12/15.  Medtronic CRT-D device placed with improvement in LV function, echo in 4/19 showed EF improved to 60-65%. PYP scan in 3/20 was equivocal for transthyretin amyloidosis, PYP scan in 1/22 was not suggestive of transthyretin amyloidosis. LHC/RHC in 8/21 showed nonobstructive CAD, normal filling pressures and cardiac output. Echo 8/21 showed EF 50-55%, mildly decreased RV systolic function, moderate AI. NYHA class IIIb symptoms with progressive volume overload and renal failure recently.  [place] 2/16 showed elevated R>L heart filling pressures with low PAPI at 1.7 and preserved cardiac output. V/Q scan not suggestive of chronic PEs.  Decision made to admit for diuresis and monitoring of renal function. Repeat echo in hospital showed EF 55-60%, moderate LVH, D-shaped interventricular septum, RV moderately dilated with low normal function, severe biatrial enlargement, severe TR => confirming prominent RV failure.  Breathing significantly better since admission  - discussed with nephrology we will continue trial of diuretics 60mg  torsemide BID - Continue Bidil 1/2 tab tid.  - decrease coreg 3.125mg  2. Atrial flutter/fibrillation: History of atypical flutter and atrial fibrillation.Lately has been in NSR on amiodarone 100 mg daily. She is off apixaban with recurrent severe GI bleeding.  - Continue amiodarone.  - Has been referred to Dr. Quentin Ore for evaluation for Watchman device. 3. CKD stage IV: Followed by nephrology as outpatient. Creatinine continues to worsen, holding diuretic - very close to needing HD,  HD will be difficult with RV failure. Discussed with Nephrology if no improvement over the weekend try HD early next week - avoid hypotension, continue diuretic trial, stop urea - Nephrology following.  4. Anemia: Recurrent severe GI bleeding. She is now off Eliquis. Transferrin  saturation 5% at admission.  - Received Feraheme 2/16  - As above, Watchman evaluation eventually. 5. Hypertension:stable/ improved w/ Bidil dose reduction   - avoid hypotension w/ AKI - continue on reduced Bidil 1/2 tab tid.  6. Cirrhosis -was not able to order elastography inpatient but US shows stable, compensated cirrhosis with normal doppler studies with no ascites 7. Hyponatremia -Not a candidate for tolvaptan due to liver disease, nephrology following -getting urea packets but d/ced due to markedly elevated BUN  Length of Stay: 9  Katherine Roan, MD  07/02/2020, 8:52 AM  Advanced Heart Failure Team Pager [phone] (M-F; 7a - 4p)  Please contact [facility] for night-coverage after hours (4p -7a ) and weekends on [email or web address]  Patient seen with Dr. Shan Levans, agree with the above note.   Creatinine up to 4.35 today but BUN took a big jump to 116.  Sodium higher at 126.  She denies dyspnea. She is  mentally clear and says she feels ok.  General: NAD Neck: JVP 10 cm, no thyromegaly or thyroid nodule.  Lungs: Clear to auscultation bilaterally with normal respiratory effort. CV: Nondisplaced PMI.  Heart regular S1/S2, no S3/S4, 2/6 HSM LLSB.  No peripheral edema.   Abdomen: Soft, nontender, no hepatosplenomegaly, no distention.  Skin: Intact without lesions or rashes.  Neurologic: Alert and oriented x 3.  Psych: Normal affect. Extremities: No clubbing or cyanosis.  HEENT: Normal.   Unfortunately, with cirrhosis, she is not a candidate for tolvaptan.  However, Na is higher this morning at 126.   - Continue fluid restriction, 1200 cc.   Volume looks mildly elevated.  JVP will be difficult to follow with severe TR.  She got 1 dose of 60 mg torsemide yesterday, now BUN up markedly and creatinine to 4.35.  - Discussed with nephrology trial torsemide 60 BID today - Run BP higher, decrease Coreg to 3.125 mg bid.  - Can continue lower Bidil, 1/2 tab tid.  - Appreciate  nephrology assistance,  if no improvement in renal function over the weekend try HD early next week   Loralie Champagne 07/02/2020 9:51 AM

## 2020-07-03 DIAGNOSIS — I5033 Acute on chronic diastolic (congestive) heart failure: Secondary | ICD-10-CM | POA: Diagnosis not present

## 2020-07-03 DIAGNOSIS — I50810 Right heart failure, unspecified: Secondary | ICD-10-CM | POA: Diagnosis not present

## 2020-07-03 LAB — CBC
HCT: 31.7 % — ABNORMAL LOW (ref 36.0–46.0)
Hemoglobin: 10.7 g/dL — ABNORMAL LOW (ref 12.0–15.0)
MCH: 28.2 pg (ref 26.0–34.0)
MCHC: 33.8 g/dL (ref 30.0–36.0)
MCV: 83.4 fL (ref 80.0–100.0)
Platelets: 250 10*3/uL (ref 150–400)
RBC: 3.8 MIL/uL — ABNORMAL LOW (ref 3.87–5.11)
RDW: 19.5 % — ABNORMAL HIGH (ref 11.5–15.5)
WBC: 7.9 10*3/uL (ref 4.0–10.5)
nRBC: 0 % (ref 0.0–0.2)

## 2020-07-03 LAB — RENAL FUNCTION PANEL
Albumin: 4 g/dL (ref 3.5–5.0)
Anion gap: 15 (ref 5–15)
BUN: 140 mg/dL — ABNORMAL HIGH (ref 8–23)
CO2: 25 mmol/L (ref 22–32)
Calcium: 10.2 mg/dL (ref 8.9–10.3)
Chloride: 85 mmol/L — ABNORMAL LOW (ref 98–111)
Creatinine, Ser: 4.52 mg/dL — ABNORMAL HIGH (ref 0.44–1.00)
GFR, Estimated: 10 mL/min — ABNORMAL LOW (ref >60)
Glucose, Bld: 105 mg/dL — ABNORMAL HIGH (ref 70–99)
Phosphorus: 2.7 mg/dL (ref 2.5–4.6)
Potassium: 4.4 mmol/L (ref 3.5–5.1)
Sodium: 125 mmol/L — ABNORMAL LOW (ref 135–145)

## 2020-07-03 MED ORDER — TRAZODONE HCL 50 MG PO TABS
50.0000 mg | ORAL_TABLET | Freq: Every evening | ORAL | Status: DC | PRN
  Administered 2020-07-04 – 2020-07-11 (×7): 50 mg via ORAL
  Filled 2020-07-03 (×7): qty 1

## 2020-07-03 MED ORDER — SODIUM CHLORIDE 0.9 % IV BOLUS
250.0000 mL | Freq: Once | INTRAVENOUS | Status: AC
  Administered 2020-07-03: 250 mL via INTRAVENOUS

## 2020-07-03 NOTE — Progress Notes (Signed)
Progress Note  Patient Name: Morgan Clay Date of Encounter: 07/03/2020  Primary Cardiologist: No primary care provider on file.   Subjective   Patient notes that she is doing well.  Since day prior notes her breathing is the best it has been in some time changes.  Relevant interval testing or therapy include increase in her torsemine.    No chest pain or pressure .  No SOB/DOE and no PND/Orthopnea.   Patient notes concerns with her long term prognosis and how he has seen her overall GOC.    Inpatient Medications    Scheduled Meds: . allopurinol  100 mg Oral Daily  . amiodarone  100 mg Oral Daily  . carvedilol  3.125 mg Oral BID WC  . gabapentin  300 mg Oral Daily  . heparin  5,000 Units Subcutaneous Q8H  . isosorbide-hydrALAZINE  0.5 tablet Oral TID  . levothyroxine  137 mcg Oral Q0600  . pantoprazole  40 mg Oral Daily  . sodium chloride flush  3 mL Intravenous Q12H  . torsemide  60 mg Oral BID   Continuous Infusions: . sodium chloride    . sodium chloride     PRN Meds: sodium chloride, sodium chloride, acetaminophen, ondansetron (ZOFRAN) IV, sodium chloride flush, traMADol   Vital Signs    Vitals:   07/02/20 0313 07/02/20 1142 07/02/20 1952 07/03/20 0500  BP: (!) 105/46 (!) 143/63 (!) 119/53 (!) 131/53  Pulse: 80 60 65 64  Resp: 16 17 20 19   Temp: 98.7 F (37.1 C) 98.3 F (36.8 C) 99.4 F (37.4 C) 98.7 F (37.1 C)  TempSrc: Oral Oral Oral Oral  SpO2: 90% 91% 100% 100%  Weight: 60 kg   59.7 kg  Height:        Intake/Output Summary (Last 24 hours) at 07/03/2020 1119 Last data filed at 07/03/2020 9509 Gross per 24 hour  Intake 1080 ml  Output 1500 ml  Net -420 ml   Filed Weights   07/01/20 0518 07/02/20 0313 07/03/20 0500  Weight: 59.6 kg 60 kg 59.7 kg    Telemetry    V paced - Personally Reviewed  ECG    No new - Personally Reviewed  Physical Exam   GEN: No acute distress.   Neck: JVD mid neck Cardiac: RRR, II/VI holosystolic murmur,  RV heave, no rubs, or gallops.  Respiratory: Clear to auscultation bilaterally. GI: Soft, nontender, non-distended  MS: +1 edema; No deformity. Neuro:  Nonfocal  Psych: Normal affect   Labs    Chemistry Recent Labs  Lab 07/01/20 0333 07/02/20 0309 07/03/20 0242  NA 124* 126* 125*  K 3.8 4.1 4.4  CL 84* 87* 85*  CO2 26 26 25   GLUCOSE 109* 105* 105*  BUN 76* 116* 140*  CREATININE 4.00* 4.35* 4.52*  CALCIUM 9.8 9.9 10.2  ALBUMIN 3.8 3.8 4.0  GFRNONAA 11* 10* 10*  ANIONGAP 14 13 15      Hematology Recent Labs  Lab 06/27/20 0403 06/28/20 0318 07/03/20 0242  WBC 6.9 7.4 7.9  RBC 3.21* 3.36* 3.80*  HGB 9.0* 9.1* 10.7*  HCT 27.3* 28.9* 31.7*  MCV 85.0 86.0 83.4  MCH 28.0 27.1 28.2  MCHC 33.0 31.5 33.8  RDW 18.4* 18.5* 19.5*  PLT 188 204 250    Cardiac EnzymesNo results for input(s): TROPONINI in the last 168 hours. No results for input(s): TROPIPOC in the last 168 hours.   BNPNo results for input(s): BNP, PROBNP in the last 168 hours.   DDimer No results  for input(s): DDIMER in the last 168 hours.   Radiology    No results found.  Cardiac Studies   No new  Patient Profile     75 y.o. female RV failure, CKD IV approaching ESRD, Cirrhosis, AF with bleeding issues not on AC, Cirrhosis, and hyponetremia (chronic)  Assessment & Plan    Right Ventricular Failure - Reasonable to continue 60mg  torsemide BID; planned for possible HD next week if no improvement; thought this may be poorly tolerated in a preload dependent state - will DC Bidil if symptomatic hypotension - coreg 3.125mg   Atrial flutter/fibrillation:  IDA - no AC because of prior bleeds - Continue amiodarone.  - Received Feraheme 2/16 - dependent on [place] may need to repeat transferrin at  CKD stage IV:   Hyponateremia Cirrhosis - appreciate renal recs; possible HD star - As above, Watchman evaluationeventually.  Full code Presently SCD's DVT Proph Labs ordered  Time Spent Directly  with Patient:   I have spent a total of 35 minuteswith the patient reviewing hospital notes, telemetry, EKGs, labs and examining the patient as well as establishing an assessment and plan that was discussed personally with the patient.  > 50% of time was spent in direct patient care:  Discussed long term plan, RV failure, cirrhosis.  Missed her daughter.  GOC is to be with her daughter and family, breathing well, for as long as possible.  HD would be within that goal.   For questions or updates, please contact [place] Please consult [email or web address] for contact info under Cardiology/STEMI.      Signed, Werner Lean, MD  07/03/2020, 11:19 AM

## 2020-07-03 NOTE — Progress Notes (Signed)
[facility] ROUNDING NOTE   Subjective:   She had 1.5 L uop over 2/25.  She feels ok.  She's discouraged about her labs.  We discussed the risks/benefits/indications for dialysis and she does agree to start dialysis.  We discussed that risks for her are higher due to her heart disease.     Review of systems:  Denies shortness of breath or chest pain  Denies n/v   ---------- Brief history:75 year old lady with history of nonischemic cardiomyopathy, she underwent Medtronic CRT-D biventricular pacer with improved ejection fraction last 2D echo showing ejection fraction of 60-65%.  She had a Cardiolite study done that showed no evidence of any ischemia.  She has had problems with GI bleeding and has been on Eliquis for atrial flutter.  She developed a gastric ulcer and noted to have small multiple lesions on AVMs on capsule endoscopy.  She had a right-sided heart catheterization 12/27/2019 with nonobstructive coronary artery disease.  She is undergoing right sided cardiac catheterization 06/23/2020 without the use of IV contrast she has a history of diabetes is diet controlled, history of gout, history of atrial flutter, history of hepatitis C treated with Harvoni, history of small bowel obstruction and history of GI bleed.  Chronic kidney disease is being followed by [name].  Saw [name] then Dr. Johnney Ou at [facility] in the past.  Her most recent hospitalization 05/27/2020 was anemia and GI bleed status post colonoscopy and 2 units packed red blood cells with discontinuation of Eliquis.  Objective:  Vital signs in last 24 hours:  Temp:  [98.7 F (37.1 C)-99.4 F (37.4 C)] 98.7 F (37.1 C) (02/26 0500) Pulse Rate:  [64-65] 64 (02/26 0500) Resp:  [19-20] 19 (02/26 0500) BP: (119-131)/(53) 131/53 (02/26 0500) SpO2:  [100 %] 100 % (02/26 0500) Weight:  [59.7 kg] 59.7 kg (02/26 0500)  Weight change: -0.266 kg Filed Weights   07/01/20 0518 07/02/20 0313 07/03/20 0500   Weight: 59.6 kg 60 kg 59.7 kg    Intake/Output: I/O last 3 completed shifts: In: 4742 [P.O.:1420] Out: 2200 [Urine:2200]   Intake/Output this shift:  Total I/O In: 360 [P.O.:360] Out: -   General adult female in bed in no acute distress HEENT normocephalic atraumatic extraocular movements intact sclera anicteric Neck supple trachea midline Lungs clear to auscultation bilaterally normal work of breathing at rest on room air Heart regular rate and rhythm no rubs or gallops appreciated Abdomen soft nontender  nd Extremities no pitting edema  Psych normal mood and affect Neuro alert and oriented x 3 provides hx and follows commands   Basic Metabolic Panel: Recent Labs  Lab 06/29/20 0341 06/30/20 0336 07/01/20 0333 07/02/20 0309 07/03/20 0242  NA 125* 125* 124* 126* 125*  K 3.5 3.9 3.8 4.1 4.4  CL 86* 83* 84* 87* 85*  CO2 27 26 26 26 25   GLUCOSE 102* 96 109* 105* 105*  BUN 57* 71* 76* 116* 140*  CREATININE 3.25* 3.68* 4.00* 4.35* 4.52*  CALCIUM 9.7 9.7 9.8 9.9 10.2  PHOS 3.3 4.2 4.1 3.5 2.7    Liver Function Tests: Recent Labs  Lab 06/29/20 0341 06/30/20 0336 07/01/20 0333 07/02/20 0309 07/03/20 0242  ALBUMIN 3.8 3.8 3.8 3.8 4.0   CBC: Recent Labs  Lab 06/27/20 0403 06/28/20 0318 07/03/20 0242  WBC 6.9 7.4 7.9  HGB 9.0* 9.1* 10.7*  HCT 27.3* 28.9* 31.7*  MCV 85.0 86.0 83.4  PLT 188 204 250    Microbiology: Results for orders placed or performed  during the hospital encounter of 06/22/20  SARS CORONAVIRUS 2 (TAT 6-24 HRS) Nasopharyngeal Nasopharyngeal Swab     Status: None   Collection Time: 06/22/20 11:19 AM   Specimen: Nasopharyngeal Swab  Result Value Ref Range Status   SARS Coronavirus 2 NEGATIVE NEGATIVE Final    Comment: (NOTE) SARS-CoV-2 target nucleic acids are NOT DETECTED.  The SARS-CoV-2 RNA is generally detectable in upper and lower respiratory specimens during the acute phase of infection. Negative results do not preclude  SARS-CoV-2 infection, do not rule out co-infections with other pathogens, and should not be used as the sole basis for treatment or other patient management decisions. Negative results must be combined with clinical observations, patient history, and epidemiological information. The expected result is Negative.  Fact Sheet for Patients: SugarRoll.be  Fact Sheet for Healthcare Providers: https://www.woods-mathews.com/  This test is not yet approved or cleared by the [place] FDA and  has been authorized for detection and/or diagnosis of SARS-CoV-2 by FDA under an Emergency Use Authorization (EUA). This EUA will remain  in effect (meaning this test can be used) for the duration of the COVID-19 declaration under Se ction 564(b)(1) of the Act, 21 U.S.C. section 360bbb-3(b)(1), unless the authorization is terminated or revoked sooner.  Performed at [facility], [place] [address], [place], [place] [address]    *Note: Due to a large number of results and/or encounters for the requested time period, some results have not been displayed. A complete set of results can be found in Results Review.    Imaging: No results found.   Medications:   . sodium chloride    . sodium chloride     . allopurinol  100 mg Oral Daily  . amiodarone  100 mg Oral Daily  . carvedilol  3.125 mg Oral BID WC  . gabapentin  300 mg Oral Daily  . heparin  5,000 Units Subcutaneous Q8H  . isosorbide-hydrALAZINE  0.5 tablet Oral TID  . levothyroxine  137 mcg Oral Q0600  . pantoprazole  40 mg Oral Daily  . sodium chloride flush  3 mL Intravenous Q12H  . torsemide  60 mg Oral BID   sodium chloride, sodium chloride, acetaminophen, ondansetron (ZOFRAN) IV, sodium chloride flush, traMADol  Assessment/ Plan:   CKD 4: progression over time.  Baseline around 2.5 with creatinine.Likely cardiorenal syndrome   Feels she has likely progressed to ESRD  Renal panel  in am   pause torsemide  Normal saline 250 mL once now   Consult IR for tunneled catheter on 2/28  Plan for HD on 2/28 after catheter .   Acute on chronic diastolic chf RHC on 7/51 with elevated filling pressures, RV failure and moderate pulmonary venous hypertension.    Note home regimen 40 mg BID and admitted with CHF on same  Diuretics as above   Hyponatremia - Stopped trazodone. Diuretics as above.  Unfortunately, not a candidate for tolvaptan with liver disease.  S/p urea - now off after three doses.   ANEMIA of iron deficiency and CKD-history of GI bleed will need close follow-up.  Status post IV iron.  aranesp 40 mcg once on 2/22. improved  HTN: controlled  Hypothyroidism on replacement therapy per primary team  Nonischemic cardiomyopathy negative Cardiolite study 12/15  Diabetes mellitus diet controlled - per primary team   Atrial fibrillation not a candidate for anticoagulation secondary to bleeding per charting.  Please continue inpatient monitoring - plan for HD starting 2/28  Claudia Desanctis, MD 07/03/2020  12:57 PM

## 2020-07-03 NOTE — Progress Notes (Incomplete)
Called to pt room due to daughter being upset regarding communication between MD and patient, mainly cardiology. Specifically discussion about pt prognosis being more "negative" than "positive". This RN communicated to pt and daughter that the MD are required to discuss options, benefits and risks of any procedure, prognosis (whether good or poor) as well as give professional opinions about  . For further clarification please include daughter in conversations to prevent confusion. Made note on Physician Sticky Note as well.

## 2020-07-04 DIAGNOSIS — N184 Chronic kidney disease, stage 4 (severe): Secondary | ICD-10-CM

## 2020-07-04 DIAGNOSIS — I50810 Right heart failure, unspecified: Secondary | ICD-10-CM | POA: Diagnosis not present

## 2020-07-04 DIAGNOSIS — I5033 Acute on chronic diastolic (congestive) heart failure: Secondary | ICD-10-CM | POA: Diagnosis not present

## 2020-07-04 DIAGNOSIS — I480 Paroxysmal atrial fibrillation: Secondary | ICD-10-CM

## 2020-07-04 LAB — CBC
HCT: 34.1 % — ABNORMAL LOW (ref 36.0–46.0)
Hemoglobin: 11.3 g/dL — ABNORMAL LOW (ref 12.0–15.0)
MCH: 27.9 pg (ref 26.0–34.0)
MCHC: 33.1 g/dL (ref 30.0–36.0)
MCV: 84.2 fL (ref 80.0–100.0)
Platelets: 262 10*3/uL (ref 150–400)
RBC: 4.05 MIL/uL (ref 3.87–5.11)
RDW: 19.5 % — ABNORMAL HIGH (ref 11.5–15.5)
WBC: 7.1 10*3/uL (ref 4.0–10.5)
nRBC: 0 % (ref 0.0–0.2)

## 2020-07-04 LAB — RENAL FUNCTION PANEL
Albumin: 4.1 g/dL (ref 3.5–5.0)
Anion gap: 15 (ref 5–15)
BUN: 138 mg/dL — ABNORMAL HIGH (ref 8–23)
CO2: 26 mmol/L (ref 22–32)
Calcium: 10.3 mg/dL (ref 8.9–10.3)
Chloride: 83 mmol/L — ABNORMAL LOW (ref 98–111)
Creatinine, Ser: 4.21 mg/dL — ABNORMAL HIGH (ref 0.44–1.00)
GFR, Estimated: 11 mL/min — ABNORMAL LOW (ref >60)
Glucose, Bld: 117 mg/dL — ABNORMAL HIGH (ref 70–99)
Phosphorus: 3.8 mg/dL (ref 2.5–4.6)
Potassium: 4 mmol/L (ref 3.5–5.1)
Sodium: 124 mmol/L — ABNORMAL LOW (ref 135–145)

## 2020-07-04 MED ORDER — CHLORHEXIDINE GLUCONATE CLOTH 2 % EX PADS
6.0000 | MEDICATED_PAD | Freq: Every day | CUTANEOUS | Status: DC
  Administered 2020-07-05 – 2020-07-12 (×7): 6 via TOPICAL

## 2020-07-04 MED ORDER — CEFAZOLIN SODIUM-DEXTROSE 2-4 GM/100ML-% IV SOLN
2.0000 g | INTRAVENOUS | Status: AC
  Filled 2020-07-04: qty 100

## 2020-07-04 NOTE — Consult Note (Signed)
Patient Status: [facility] - In-pt  Assessment and Plan: Patient with history of progressive CKD (probable ESRD per notes) with tentative plans to initiate HD as management.  Plan for image-guided tunneled HD catheter placement in IR tentatively for tomorrow 07/05/2020 pending IR scheduling. Patient will be NPO at midnight. Afebrile and WBCs WNL. Ancef ordered.  Risks and benefits discussed with the patient including, but not limited to bleeding, infection, vascular injury, pneumothorax which may require chest tube placement, air embolism or even death. All of the patient's questions were answered, patient is agreeable to proceed. Consent signed and in IR control room.  ______________________________________________________________________   History of Present Illness: Morgan Clay is a 75 y.o. female with a past medical history of hypertension, non-ischemic cardiomyopathy, LBBB, hyperlipidemia, HF, aflutter/afib not on chronic anticoagulation, pneumonia, asthma/chronic bronchitis, COVID-19 infection 04/2020, Hepatitis C, lower GI bleed 2019, diverticulosis, CKD, diabetes mellitus, hypothyroidism, anemia, arthritis, gout, osteopenia, glaucoma, obesity, OSA not on CPAP, and depression. She has been admitted to [facility] since 06/23/2020 for management of HF and RV dysfunction. Nephrology was consulted on admission secondary to history of CKD. Her renal function continues to decline (probable progression to ESRD per notes), and it is recommended that she initiates dialysis at this time.  IR consulted by Dr. Royce Macadamia for possible image-guided tunneled HD catheter placement. Patient awake and alert laying in bed talking on phone with no complaints at this time. Denies fever, chills, chest pain, dyspnea, abdominal pain, or headache.   Allergies and medications reviewed.   Review of Systems: A 12 point ROS discussed and pertinent positives are indicated in the HPI above.  All other systems are  negative.  Review of Systems  Constitutional: Negative for chills and fever.  Respiratory: Negative for shortness of breath and wheezing.   Cardiovascular: Negative for chest pain and palpitations.  Gastrointestinal: Negative for abdominal pain.  Neurological: Negative for headaches.  Psychiatric/Behavioral: Negative for behavioral problems and confusion.    Vital Signs: BP (!) 115/50 (BP Location: Right Arm)   Pulse 60   Temp 98 F (36.7 C) (Oral)   Resp 17   Ht 4\' 11"  (1.499 m)   Wt 128 lb 15.5 oz (58.5 kg)   SpO2 98%   BMI 26.05 kg/m   Physical Exam Vitals and nursing note reviewed.  Constitutional:      General: She is not in acute distress.    Appearance: Normal appearance.  Cardiovascular:     Rate and Rhythm: Normal rate and regular rhythm.     Heart sounds: Normal heart sounds. No murmur heard.   Pulmonary:     Effort: Pulmonary effort is normal. No respiratory distress.     Breath sounds: Normal breath sounds. No wheezing.  Skin:    General: Skin is warm and dry.  Neurological:     Mental Status: She is alert and oriented to person, place, and time.      Imaging reviewed.   Labs:  COAGS: Recent Labs    12/05/19 1243 05/27/20 1843  INR 1.3* 2.1*  APTT  --  44*    BMP: Recent Labs    12/05/19 1243 12/11/19 0819 12/29/19 1108 01/06/20 0957 02/04/20 1416 02/12/20 1129 07/01/20 0333 07/02/20 0309 07/03/20 0242 07/04/20 0401  NA 136   < > 132* 130* 135   < > 124* 126* 125* 124*  K 4.0   < > 3.9 4.9 4.5   < > 3.8 4.1 4.4 4.0  CL 102  --  96* 98  104   < > 84* 87* 85* 83*  CO2 24  --  23 20* 23   < > 26 26 25 26   GLUCOSE 171*  --  110* 118* 119*   < > 109* 105* 105* 117*  BUN 35*  --  80* 69* 60*   < > 76* 116* 140* 138*  CALCIUM 9.4  --  10.0 9.4 9.9   < > 9.8 9.9 10.2 10.3  CREATININE 1.94*  --  2.88* 2.57* 2.24*   < > 4.00* 4.35* 4.52* 4.21*  GFRNONAA 25*  --  16* 18* 21*   < > 11* 10* 10* 11*  GFRAA 29*  --  18* 21* 25*  --   --   --    --   --    < > = values in this interval not displayed.       Electronically Signed: Earley Abide, PA-C 07/04/2020, 10:11 AM   I spent a total of 20 minutes in face to face in clinical consultation, greater than 50% of which was counseling/coordinating care for HD access.

## 2020-07-04 NOTE — Progress Notes (Signed)
Progress Note  Patient Name: Morgan Clay Date of Encounter: 07/04/2020  Primary Cardiologist: No primary care provider on file.   Subjective   Patient notes that she is doing well.  Able to lay flat.  Planned for HD cath tomorrow with IR.  No chest pain or pressure .  No SOB/DOE.  Patient notes concerns with HD cath procedure.  Inpatient Medications    Scheduled Meds: . allopurinol  100 mg Oral Daily  . amiodarone  100 mg Oral Daily  . carvedilol  3.125 mg Oral BID WC  . gabapentin  300 mg Oral Daily  . heparin  5,000 Units Subcutaneous Q8H  . isosorbide-hydrALAZINE  0.5 tablet Oral TID  . levothyroxine  137 mcg Oral Q0600  . pantoprazole  40 mg Oral Daily  . sodium chloride flush  3 mL Intravenous Q12H   Continuous Infusions: . sodium chloride    . sodium chloride    . [START ON 07/05/2020]  ceFAZolin (ANCEF) IV     PRN Meds: sodium chloride, sodium chloride, acetaminophen, ondansetron (ZOFRAN) IV, sodium chloride flush, traMADol, traZODone   Vital Signs    Vitals:   07/04/20 0007 07/04/20 0010 07/04/20 0604 07/04/20 0920  BP: (!) 146/56 (!) 146/56 (!) 118/55 (!) 115/50  Pulse: 61 (!) 59 65 60  Resp: 18 14 18 17   Temp: (!) 97.4 F (36.3 C) (!) 97.4 F (36.3 C) 98 F (36.7 C) 98 F (36.7 C)  TempSrc: Oral Oral Oral Oral  SpO2: 100% 100% 98% 98%  Weight:      Height:        Intake/Output Summary (Last 24 hours) at 07/04/2020 1104 Last data filed at 07/04/2020 0857 Gross per 24 hour  Intake 540 ml  Output 1625 ml  Net -1085 ml   Filed Weights   07/02/20 0313 07/03/20 0500 07/04/20 0000  Weight: 60 kg 59.7 kg 58.5 kg    Telemetry    AV paced - Personally Reviewed  ECG    No new - Personally Reviewed  Physical Exam   GEN: No acute distress.   Neck: JVD lower third neck (60 degrees) Cardiac: RRR, II/VI holosystolic murmur, RV heave, no rubs, or gallops.  Respiratory: Clear to auscultation bilaterally. No tachypnea (better than vital  signs recorded; closer to RR 14) GI: Soft, nontender, non-distended  MS: +1 edema; No deformity. Neuro:  Nonfocal  Psych: Normal affect   Labs    Chemistry Recent Labs  Lab 07/02/20 0309 07/03/20 0242 07/04/20 0401  NA 126* 125* 124*  K 4.1 4.4 4.0  CL 87* 85* 83*  CO2 26 25 26   GLUCOSE 105* 105* 117*  BUN 116* 140* 138*  CREATININE 4.35* 4.52* 4.21*  CALCIUM 9.9 10.2 10.3  ALBUMIN 3.8 4.0 4.1  GFRNONAA 10* 10* 11*  ANIONGAP 13 15 15      Hematology Recent Labs  Lab 06/28/20 0318 07/03/20 0242 07/04/20 0401  WBC 7.4 7.9 7.1  RBC 3.36* 3.80* 4.05  HGB 9.1* 10.7* 11.3*  HCT 28.9* 31.7* 34.1*  MCV 86.0 83.4 84.2  MCH 27.1 28.2 27.9  MCHC 31.5 33.8 33.1  RDW 18.5* 19.5* 19.5*  PLT 204 250 262    Cardiac EnzymesNo results for input(s): TROPONINI in the last 168 hours. No results for input(s): TROPIPOC in the last 168 hours.   BNPNo results for input(s): BNP, PROBNP in the last 168 hours.   DDimer No results for input(s): DDIMER in the last 168 hours.   Radiology  No results found.  Cardiac Studies   No new  Patient Profile     75 y.o. female RV failure, CKD IV approaching ESRD, Cirrhosis, AF with bleeding issues not on AC, Cirrhosis, and hyponetremia (chronic)  Assessment & Plan    Right Ventricular Failure - Reasonable to continue 60mg  torsemide BID; planned for possible HD next week if no improvement; thought this may be poorly tolerated in a preload dependent state - will DC Bidil if symptomatic hypotension - coreg 3.125mg   Atrial flutter/fibrillation:  IDA - no AC because of prior bleeds - Continue amiodarone 100 mg Po daily - Received Feraheme 2/16 - dependent on GOC may need to repeat transferrin at  CKD stage IV:   Hyponateremia Cirrhosis - appreciate renal recs; planned for HD access 07/05/20 - As above, Watchman evaluationcould be considered if she tolerate outpatient HD  Full code  SCD's DVT Proph NPO at midnight order  confirmed  For questions or updates, please contact [facility] Please consult [email or web address] for contact info under Cardiology/STEMI.      Signed, Werner Lean, MD  07/04/2020, 11:04 AM

## 2020-07-04 NOTE — Progress Notes (Signed)
[facility] ROUNDING NOTE   Subjective:   She had 1.6 L uop over 2/26.  She feels ok.  I spoke with the patient and her daughter joined via speakerphone.  We discussed again about plans for HD tomorrow and she still does want to proceed.  States IR saw her this am.   Review of systems:   Denies shortness of breath or chest pain  Denies n/v States has a good appetite. Some tremor note in hands  ---------- Brief history:75 year old lady with history of nonischemic cardiomyopathy, she underwent Medtronic CRT-D biventricular pacer with improved ejection fraction last 2D echo showing ejection fraction of 60-65%.  She had a Cardiolite study done that showed no evidence of any ischemia.  She has had problems with GI bleeding and has been on Eliquis for atrial flutter.  She developed a gastric ulcer and noted to have small multiple lesions on AVMs on capsule endoscopy.  She had a right-sided heart catheterization 12/27/2019 with nonobstructive coronary artery disease.  She is undergoing right sided cardiac catheterization 06/23/2020 without the use of IV contrast she has a history of diabetes is diet controlled, history of gout, history of atrial flutter, history of hepatitis C treated with Harvoni, history of small bowel obstruction and history of GI bleed.  Chronic kidney disease is being followed by [name].  Saw [name] then Dr. Johnney Ou at [facility] in the past.  Her most recent hospitalization 05/27/2020 was anemia and GI bleed status post colonoscopy and 2 units packed red blood cells with discontinuation of Eliquis.  Objective:  Vital signs in last 24 hours:  Temp:  [97.4 F (36.3 C)-98.3 F (36.8 C)] 98 F (36.7 C) (02/27 0604) Pulse Rate:  [59-65] 65 (02/27 0604) Resp:  [14-18] 18 (02/27 0604) BP: (118-146)/(54-74) 118/55 (02/27 0604) SpO2:  [98 %-100 %] 98 % (02/27 0604) Weight:  [58.5 kg] 58.5 kg (02/27 0000)  Weight change: -1.2 kg Filed Weights   07/02/20  0313 07/03/20 0500 07/04/20 0000  Weight: 60 kg 59.7 kg 58.5 kg    Intake/Output: I/O last 3 completed shifts: In: 1140 [P.O.:1140] Out: 2525 [Urine:2525]   Intake/Output this shift:  Total I/O In: 240 [P.O.:240] Out: -   General adult female in bed in no acute distress   HEENT normocephalic atraumatic extraocular movements intact sclera anicteric Neck supple trachea midline Lungs clear to auscultation bilaterally normal work of breathing at rest on room air Heart regular rate and rhythm no rubs or gallops appreciated Abdomen soft nontender  nd Extremities no pitting edema  Psych normal mood and affect Neuro alert and oriented x 3 provides hx and follows commands; jerking tremor of hands when holding phone   Basic Metabolic Panel: Recent Labs  Lab 06/30/20 0336 07/01/20 0333 07/02/20 0309 07/03/20 0242 07/04/20 0401  NA 125* 124* 126* 125* 124*  K 3.9 3.8 4.1 4.4 4.0  CL 83* 84* 87* 85* 83*  CO2 26 26 26 25 26   GLUCOSE 96 109* 105* 105* 117*  BUN 71* 76* 116* 140* 138*  CREATININE 3.68* 4.00* 4.35* 4.52* 4.21*  CALCIUM 9.7 9.8 9.9 10.2 10.3  PHOS 4.2 4.1 3.5 2.7 3.8    Liver Function Tests: Recent Labs  Lab 06/30/20 0336 07/01/20 0333 07/02/20 0309 07/03/20 0242 07/04/20 0401  ALBUMIN 3.8 3.8 3.8 4.0 4.1   CBC: Recent Labs  Lab 06/28/20 0318 07/03/20 0242 07/04/20 0401  WBC 7.4 7.9 7.1  HGB 9.1* 10.7* 11.3*  HCT 28.9* 31.7* 34.1*  MCV 86.0 83.4 84.2  PLT 204 250 262    Microbiology: Results for orders placed or performed during the hospital encounter of 06/22/20  SARS CORONAVIRUS 2 (TAT 6-24 HRS) Nasopharyngeal Nasopharyngeal Swab     Status: None   Collection Time: 06/22/20 11:19 AM   Specimen: Nasopharyngeal Swab  Result Value Ref Range Status   SARS Coronavirus 2 NEGATIVE NEGATIVE Final    Comment: (NOTE) SARS-CoV-2 target nucleic acids are NOT DETECTED.  The SARS-CoV-2 RNA is generally detectable in upper and lower respiratory  specimens during the acute phase of infection. Negative results do not preclude SARS-CoV-2 infection, do not rule out co-infections with other pathogens, and should not be used as the sole basis for treatment or other patient management decisions. Negative results must be combined with clinical observations, patient history, and epidemiological information. The expected result is Negative.  Fact Sheet for Patients: SugarRoll.be  Fact Sheet for Healthcare Providers: https://www.woods-mathews.com/  This test is not yet approved or cleared by the [place] FDA and  has been authorized for detection and/or diagnosis of SARS-CoV-2 by FDA under an Emergency Use Authorization (EUA). This EUA will remain  in effect (meaning this test can be used) for the duration of the COVID-19 declaration under Se ction 564(b)(1) of the Act, 21 U.S.C. section 360bbb-3(b)(1), unless the authorization is terminated or revoked sooner.  Performed at [facility], [place] [address], [place], [place] [address]    *Note: Due to a large number of results and/or encounters for the requested time period, some results have not been displayed. A complete set of results can be found in Results Review.    Imaging: No results found.   Medications:   . sodium chloride    . sodium chloride     . allopurinol  100 mg Oral Daily  . amiodarone  100 mg Oral Daily  . carvedilol  3.125 mg Oral BID WC  . gabapentin  300 mg Oral Daily  . heparin  5,000 Units Subcutaneous Q8H  . isosorbide-hydrALAZINE  0.5 tablet Oral TID  . levothyroxine  137 mcg Oral Q0600  . pantoprazole  40 mg Oral Daily  . sodium chloride flush  3 mL Intravenous Q12H   sodium chloride, sodium chloride, acetaminophen, ondansetron (ZOFRAN) IV, sodium chloride flush, traMADol, traZODone  Assessment/ Plan:   CKD 4: progression over time.  Baseline around 2.5 with creatinine.Likely cardiorenal  syndrome   She has likely progressed to ESRD  paused torsemide    Consulted IR for tunneled catheter on 2/28  Plan for HD on 2/28 after catheter.  Given her heart failure I would like to see how HD goes then discuss AVF.   Note that with preload dependent state HD may be poorly tolerated.  She does want to try and this is reasonable; we have discussed increased risks associated with HD for her   Acute on chronic diastolic chf RHC on 4/97 with elevated filling pressures, RV failure and moderate pulmonary venous hypertension.    Note home regimen 40 mg BID and admitted with CHF on same  Diuretics as above   Hyponatremia - Stopped trazodone. Diuretics as above.  Unfortunately, not a candidate for tolvaptan with liver disease.  S/p urea - now off after three doses. For HD tomorrow  ANEMIA of iron deficiency and CKD-history of GI bleed will need close follow-up.  Status post IV iron.  aranesp 40 mcg once on 2/22. Improved   HTN: controlled  Hypothyroidism on replacement therapy  per primary team  Nonischemic cardiomyopathy negative Cardiolite study 12/15  Diabetes mellitus diet controlled - per primary team   Atrial fibrillation not a candidate for anticoagulation secondary to bleeding per charting.  Please continue inpatient monitoring - plan for HD starting 2/28  Claudia Desanctis, MD 07/04/2020 9:46 AM

## 2020-07-05 ENCOUNTER — Inpatient Hospital Stay: Payer: Medicare Other | Admitting: Family Medicine

## 2020-07-05 ENCOUNTER — Inpatient Hospital Stay (HOSPITAL_COMMUNITY): Payer: Medicare Other

## 2020-07-05 DIAGNOSIS — Z006 Encounter for examination for normal comparison and control in clinical research program: Secondary | ICD-10-CM

## 2020-07-05 DIAGNOSIS — I5033 Acute on chronic diastolic (congestive) heart failure: Secondary | ICD-10-CM | POA: Diagnosis not present

## 2020-07-05 HISTORY — PX: IR PERC TUN PERIT CATH WO PORT S&I /IMAG: IMG2327

## 2020-07-05 LAB — CBC
HCT: 34.2 % — ABNORMAL LOW (ref 36.0–46.0)
Hemoglobin: 11.1 g/dL — ABNORMAL LOW (ref 12.0–15.0)
MCH: 27.5 pg (ref 26.0–34.0)
MCHC: 32.5 g/dL (ref 30.0–36.0)
MCV: 84.9 fL (ref 80.0–100.0)
Platelets: 270 10*3/uL (ref 150–400)
RBC: 4.03 MIL/uL (ref 3.87–5.11)
RDW: 19.4 % — ABNORMAL HIGH (ref 11.5–15.5)
WBC: 7 10*3/uL (ref 4.0–10.5)
nRBC: 0 % (ref 0.0–0.2)

## 2020-07-05 LAB — RENAL FUNCTION PANEL
Albumin: 4 g/dL (ref 3.5–5.0)
Anion gap: 14 (ref 5–15)
BUN: 140 mg/dL — ABNORMAL HIGH (ref 8–23)
CO2: 26 mmol/L (ref 22–32)
Calcium: 10.1 mg/dL (ref 8.9–10.3)
Chloride: 84 mmol/L — ABNORMAL LOW (ref 98–111)
Creatinine, Ser: 4.13 mg/dL — ABNORMAL HIGH (ref 0.44–1.00)
GFR, Estimated: 11 mL/min — ABNORMAL LOW (ref >60)
Glucose, Bld: 108 mg/dL — ABNORMAL HIGH (ref 70–99)
Phosphorus: 4.7 mg/dL — ABNORMAL HIGH (ref 2.5–4.6)
Potassium: 4.4 mmol/L (ref 3.5–5.1)
Sodium: 124 mmol/L — ABNORMAL LOW (ref 135–145)

## 2020-07-05 LAB — HEPATITIS B SURFACE ANTIGEN: Hepatitis B Surface Ag: NONREACTIVE

## 2020-07-05 LAB — HEPATITIS B CORE ANTIBODY, TOTAL: Hep B Core Total Ab: REACTIVE — AB

## 2020-07-05 LAB — HEPATITIS B SURFACE ANTIBODY,QUALITATIVE: Hep B S Ab: NONREACTIVE

## 2020-07-05 MED ORDER — PENTAFLUOROPROP-TETRAFLUOROETH EX AERO: 1.0000 "application " | INHALATION_SPRAY | CUTANEOUS | Status: DC | PRN

## 2020-07-05 MED ORDER — SODIUM CHLORIDE 0.9 % IV SOLN: 100.0000 mL | INTRAVENOUS | Status: DC | PRN

## 2020-07-05 MED ORDER — LIDOCAINE HCL 1 % IJ SOLN
INTRAMUSCULAR | Status: AC | PRN
Start: 2020-07-05 — End: 2020-07-05
  Administered 2020-07-05: 10 mL

## 2020-07-05 MED ORDER — MIDAZOLAM HCL 2 MG/2ML IJ SOLN
INTRAMUSCULAR | Status: AC | PRN
  Administered 2020-07-05: 1 mg via INTRAVENOUS

## 2020-07-05 MED ORDER — HEPARIN SODIUM (PORCINE) 1000 UNIT/ML IJ SOLN
INTRAMUSCULAR | Status: AC
  Filled 2020-07-05: qty 1

## 2020-07-05 MED ORDER — ALTEPLASE 2 MG IJ SOLR: 2.0000 mg | Freq: Once | INTRAMUSCULAR | Status: DC | PRN

## 2020-07-05 MED ORDER — LIDOCAINE-EPINEPHRINE 1 %-1:100000 IJ SOLN
INTRAMUSCULAR | Status: AC
  Filled 2020-07-05: qty 1

## 2020-07-05 MED ORDER — HEPARIN SODIUM (PORCINE) 5000 UNIT/ML IJ SOLN
5000.0000 [IU] | Freq: Three times a day (TID) | INTRAMUSCULAR | Status: DC
  Administered 2020-07-05 – 2020-07-12 (×12): 5000 [IU] via SUBCUTANEOUS
  Filled 2020-07-05 (×17): qty 1

## 2020-07-05 MED ORDER — CEFAZOLIN SODIUM-DEXTROSE 2-4 GM/100ML-% IV SOLN
INTRAVENOUS | Status: AC
  Administered 2020-07-05: 2 g via INTRAVENOUS
  Filled 2020-07-05: qty 100

## 2020-07-05 MED ORDER — MIDAZOLAM HCL 2 MG/2ML IJ SOLN
INTRAMUSCULAR | Status: AC
  Filled 2020-07-05: qty 2

## 2020-07-05 MED ORDER — FENTANYL CITRATE (PF) 100 MCG/2ML IJ SOLN
INTRAMUSCULAR | Status: AC | PRN
  Administered 2020-07-05: 25 ug via INTRAVENOUS

## 2020-07-05 MED ORDER — HEPARIN SODIUM (PORCINE) 1000 UNIT/ML DIALYSIS: 1000.0000 [IU] | INTRAMUSCULAR | Status: DC | PRN

## 2020-07-05 MED ORDER — LIDOCAINE-PRILOCAINE 2.5-2.5 % EX CREA: 1.0000 "application " | TOPICAL_CREAM | CUTANEOUS | Status: DC | PRN

## 2020-07-05 MED ORDER — FENTANYL CITRATE (PF) 100 MCG/2ML IJ SOLN
INTRAMUSCULAR | Status: AC
  Filled 2020-07-05: qty 2

## 2020-07-05 MED ORDER — LIDOCAINE HCL (PF) 1 % IJ SOLN: 5.0000 mL | INTRAMUSCULAR | Status: DC | PRN

## 2020-07-05 NOTE — Progress Notes (Addendum)
Patient ID: Morgan Clay, female   DOB: 06/29/1945, 75 y.o.   MRN: [id number]     Advanced Heart Failure Rounding Note  PCP-Cardiologist: No primary care provider on file.   Subjective:    One dose of torsemide 60 mg on Saturday am then given 250cc IVF.  Volume status and weight has remained stable  She is not confused, she says she is ready for her [facility] procedure today and to initiate dialysis Denies Dyspnea, orthopnea or PND.  She is NPO   Echo: EF 55-60%, moderate LVH, D-shaped interventricular septum, RV moderately dilated with low normal function, severe biatrial enlargement, severe TR.   RHC Procedural Findings (2/17): Hemodynamics (mmHg) RA mean 21 RV 49/20 PA 59/24, mean 39 PCWP mean 26 Oxygen saturations: PA 64% AO 98% Cardiac Output (Fick) 5.07  Cardiac Index (Fick) 3.23 PVR 2.6 WU Cardiac Output (Thermo) 4.72  Cardiac Index (Thermo) 3.01  PVR 2.75 WU PAPI 1.7  Objective:   Weight Range: 58.9 kg Body mass index is 26.22 kg/m.   Vital Signs:   Temp:  [98 F (36.7 C)-99 F (37.2 C)] 98 F (36.7 C) (02/28 0505) Pulse Rate:  [59-65] 62 (02/28 0505) Resp:  [16-18] 17 (02/28 0505) BP: (112-132)/(46-59) 112/48 (02/28 0505) SpO2:  [98 %-100 %] 99 % (02/28 0505) Weight:  [58.9 kg] 58.9 kg (02/28 0500) Last BM Date: 07/03/20  Weight change: Filed Weights   07/03/20 0500 07/04/20 0000 07/05/20 0500  Weight: 59.7 kg 58.5 kg 58.9 kg    Intake/Output:   Intake/Output Summary (Last 24 hours) at 07/05/2020 0750 Last data filed at 07/05/2020 0728 Gross per 24 hour  Intake 660 ml  Output 700 ml  Net -40 ml      Physical Exam  Cardiac: normal rate and rhythm, clear s1 and s2, 2/6 TR, no rubs or gallops, no LE edema Pulmonary: CTAB, not in distress Abdominal:  soft and mild tenderness on the right upper quadrant Psych: Alert, conversant, in good spirits   Telemetry   A-V paced 60s   EKG    No new EKG to review today   Labs    CBC Recent Labs     07/04/20 0401 07/05/20 0254  WBC 7.1 7.0  HGB 11.3* 11.1*  HCT 34.1* 34.2*  MCV 84.2 84.9  PLT 262 662   Basic Metabolic Panel Recent Labs    07/04/20 0401 07/05/20 0254  NA 124* 124*  K 4.0 4.4  CL 83* 84*  CO2 26 26  GLUCOSE 117* 108*  BUN 138* 140*  CREATININE 4.21* 4.13*  CALCIUM 10.3 10.1  PHOS 3.8 4.7*   Liver Function Tests Recent Labs    07/04/20 0401 07/05/20 0254  ALBUMIN 4.1 4.0   No results for input(s): LIPASE, AMYLASE in the last 72 hours. Cardiac Enzymes No results for input(s): CKTOTAL, CKMB, CKMBINDEX, TROPONINI in the last 72 hours.  BNP: BNP (last 3 results) Recent Labs    06/18/20 1300 06/21/20 1501 06/23/20 1224  BNP 1,147.8* 981.7* 1,099.2*    ProBNP (last 3 results) Recent Labs    07/17/19 1352 07/23/19 1033  PROBNP 811.0* 637.0*     D-Dimer No results for input(s): DDIMER in the last 72 hours. Hemoglobin A1C No results for input(s): HGBA1C in the last 72 hours. Fasting Lipid Panel No results for input(s): CHOL, HDL, LDLCALC, TRIG, CHOLHDL, LDLDIRECT in the last 72 hours. Thyroid Function Tests No results for input(s): TSH, T4TOTAL, T3FREE, THYROIDAB in the last 72 hours.  Invalid input(s):  FREET3  Other results:   Imaging    No results found.   Medications:     Scheduled Medications: . allopurinol  100 mg Oral Daily  . amiodarone  100 mg Oral Daily  . carvedilol  3.125 mg Oral BID WC  . Chlorhexidine Gluconate Cloth  6 each Topical Q0600  . gabapentin  300 mg Oral Daily  . heparin  5,000 Units Subcutaneous Q8H  . isosorbide-hydrALAZINE  0.5 tablet Oral TID  . levothyroxine  137 mcg Oral Q0600  . pantoprazole  40 mg Oral Daily  . sodium chloride flush  3 mL Intravenous Q12H    Infusions: . sodium chloride    . sodium chloride    .  ceFAZolin (ANCEF) IV      PRN Medications: sodium chloride, sodium chloride, acetaminophen, ondansetron (ZOFRAN) IV, sodium chloride flush, traMADol,  traZODone    Assessment/Plan   1. Chronic primarily diastolic CHF with prominent RV dysfunction: Patient has history of presumed nonischemic cardiomyopathy, EF as low at 25-30% in 12/15.  [employer]onic CRT-D device placed with improvement in LV function, echo in 4/19 showed EF improved to 60-65%. PYP scan in 3/20 was equivocal for transthyretin amyloidosis, PYP scan in 1/22 was not suggestive of transthyretin amyloidosis. LHC/RHC in 8/21 showed nonobstructive CAD, normal filling pressures and cardiac output. Echo 8/21 showed EF 50-55%, mildly decreased RV systolic function, moderate AI. NYHA class IIIb symptoms with progressive volume overload and renal failure recently.  [place] 2/16 showed elevated R>L heart filling pressures with low PAPI at 1.7 and preserved cardiac output. V/Q scan not suggestive of chronic PEs.  Decision made to admit for diuresis and monitoring of renal function. Repeat echo in hospital showed EF 55-60%, moderate LVH, D-shaped interventricular septum, RV moderately dilated with low normal function, severe biatrial enlargement, severe TR => confirming prominent RV failure.  Breathing significantly better since admission  - She is making urine, renal function still very poor, diuretics held after Saturday am - Continue Bidil 1/2 tab tid.  - Continue coreg 3.125mg  - BP better with reduced doses, may have to cut back further with dialysis or hold on dialysis days 2. Atrial flutter/fibrillation: History of atypical flutter and atrial fibrillation.Lately has been in NSR on amiodarone 100 mg daily. She is off apixaban with recurrent severe GI bleeding.  - Continue amiodarone.  - Has been referred to Dr. Quentin Ore for evaluation for Watchman device. 3. CKD stage IV: Followed by nephrology as outpatient. Creatinine continues to worsen, holding diuretic - very close to needing HD,  HD will be difficult with RV failure. Discussed with Nephrology if no improvement over the weekend try HD  early next week - now ESRD, starting dialysis today, avoid hypotension may need to hold antihypertensives on dialysis days.   - Nephrology following.  4. Anemia: Recurrent severe GI bleeding. She is now off Eliquis. Transferrin saturation 5% at admission.  - Received Feraheme 2/16  - As above, Watchman evaluation eventually. 5. Hypertension:stable/ improved w/ Bidil dose reduction   - avoid hypotension w/ dialysis, may need to hold antihypertensives on dialysis days.   may need to hold antihypertensives on dialysis days.   - continue on reduced Bidil 1/2 tab tid.  6. Cirrhosis -was not able to order elastography inpatient but US shows stable, compensated cirrhosis with normal doppler studies with no ascites 7. Hyponatremia -Not a candidate for tolvaptan due to liver disease, she will initiate dialysis today  Length of Stay: 12  Katherine Roan, MD  07/05/2020,  7:50 AM  Advanced Heart Failure Team Pager [phone] (M-F; 7a - 4p)  Please contact [facility] for night-coverage after hours (4p -7a ) and weekends on [email or web address]  Patient seen with Dr. Shan Levans, agree with the above note.   She just got tunneled HD catheter put in today.  Feels ok but BUN continues to rise, now up to 140.  She will start HD today.  BP stable on current meds.   Morgan Clay 07/05/2020 11:48 AM

## 2020-07-05 NOTE — Progress Notes (Signed)
Patient ID: Morgan Clay, female   DOB: December 14, 1945, 75 y.o.   MRN: [id number] S: No new complaints.   O:BP (!) 143/66 (BP Location: Left Arm)   Pulse (!) 59   Temp 98 F (36.7 C)   Resp 17   Ht 4\' 11"  (1.499 m)   Wt 58.9 kg   SpO2 100%   BMI 26.22 kg/m   Intake/Output Summary (Last 24 hours) at 07/05/2020 1431 Last data filed at 07/05/2020 1350 Gross per 24 hour  Intake 583 ml  Output 500 ml  Net 83 ml   Intake/Output: I/O last 3 completed shifts: In: 860 [P.O.:860] Out: 1425 [Urine:1425]  Intake/Output this shift:  Total I/O In: 343 [P.O.:240; I.V.:3; IV Piggyback:100] Out: -  Weight change: 0.377 kg Gen: NAD CVS: RRR Resp: CTA  Abd: +BS, soft, NT/ND Ext: no edema  Recent Labs  Lab 06/29/20 0341 06/30/20 0336 07/01/20 0333 07/02/20 0309 07/03/20 0242 07/04/20 0401 07/05/20 0254  NA 125* 125* 124* 126* 125* 124* 124*  K 3.5 3.9 3.8 4.1 4.4 4.0 4.4  CL 86* 83* 84* 87* 85* 83* 84*  CO2 27 26 26 26 25 26 26   GLUCOSE 102* 96 109* 105* 105* 117* 108*  BUN 57* 71* 76* 116* 140* 138* 140*  CREATININE 3.25* 3.68* 4.00* 4.35* 4.52* 4.21* 4.13*  ALBUMIN 3.8 3.8 3.8 3.8 4.0 4.1 4.0  CALCIUM 9.7 9.7 9.8 9.9 10.2 10.3 10.1  PHOS 3.3 4.2 4.1 3.5 2.7 3.8 4.7*   Liver Function Tests: Recent Labs  Lab 07/03/20 0242 07/04/20 0401 07/05/20 0254  ALBUMIN 4.0 4.1 4.0   No results for input(s): LIPASE, AMYLASE in the last 168 hours. No results for input(s): AMMONIA in the last 168 hours. CBC: Recent Labs  Lab 07/03/20 0242 07/04/20 0401 07/05/20 0254  WBC 7.9 7.1 7.0  HGB 10.7* 11.3* 11.1*  HCT 31.7* 34.1* 34.2*  MCV 83.4 84.2 84.9  PLT 250 262 270   Cardiac Enzymes: No results for input(s): CKTOTAL, CKMB, CKMBINDEX, TROPONINI in the last 168 hours. CBG: No results for input(s): GLUCAP in the last 168 hours.  Iron Studies: No results for input(s): IRON, TIBC, TRANSFERRIN, FERRITIN in the last 72 hours. Studies/Results: No results found. [name] allopurinol   100 mg Oral Daily  . amiodarone  100 mg Oral Daily  . carvedilol  3.125 mg Oral BID WC  . Chlorhexidine Gluconate Cloth  6 each Topical Q0600  . gabapentin  300 mg Oral Daily  . heparin  5,000 Units Subcutaneous Q8H  . heparin sodium (porcine)      . isosorbide-hydrALAZINE  0.5 tablet Oral TID  . levothyroxine  137 mcg Oral Q0600  . lidocaine-EPINEPHrine      . pantoprazole  40 mg Oral Daily  . sodium chloride flush  3 mL Intravenous Q12H    BMET    Component Value Date/Time   NA 124 (L) 07/05/2020 0254   NA 140 12/25/2018 1514   K 4.4 07/05/2020 0254   CL 84 (L) 07/05/2020 0254   CO2 26 07/05/2020 0254   GLUCOSE 108 (H) 07/05/2020 0254   BUN 140 (H) 07/05/2020 0254   BUN 33 (H) 12/25/2018 1514   CREATININE 4.13 (H) 07/05/2020 0254   CREATININE 2.24 (H) 02/04/2020 1416   CALCIUM 10.1 07/05/2020 0254   GFRNONAA 11 (L) 07/05/2020 0254   GFRNONAA 21 (L) 02/04/2020 1416   GFRAA 25 (L) 02/04/2020 1416   CBC    Component Value Date/Time   WBC 7.0 07/05/2020  0254   RBC 4.03 07/05/2020 0254   HGB 11.1 (L) 07/05/2020 0254   HGB 11.0 (L) 06/19/2019 1010   HGB 8.4 (L) 10/04/2017 1534   HCT 34.2 (L) 07/05/2020 0254   HCT 26.1 (L) 10/04/2017 1534   PLT 270 07/05/2020 0254   PLT 192 06/19/2019 1010   PLT 201 10/04/2017 1534   MCV 84.9 07/05/2020 0254   MCV 87 10/04/2017 1534   MCH 27.5 07/05/2020 0254   MCHC 32.5 07/05/2020 0254   RDW 19.4 (H) 07/05/2020 0254   RDW 17.3 (H) 10/04/2017 1534   LYMPHSABS 0.7 06/23/2020 1224   LYMPHSABS 2.1 10/04/2017 1534   MONOABS 0.8 06/23/2020 1224   EOSABS 0.1 06/23/2020 1224   EOSABS 0.2 10/04/2017 1534   BASOSABS 0.0 06/23/2020 1224   BASOSABS 0.0 10/04/2017 1534     Assessment/Plan:  1. AKI/CKD stage IV - in setting of ABLA and diastolic CHF (prominent RV dysfunction) without significant recovery.  Mainly cardiorenal physiology with worsening BUN/CR with diuresis.  Planning for first HD today but not clear if she will tolerate it  given biventricular failure. Discussed with Morgan Clay and she is willing to try HD. 1. S/p RIJ TDC placed today by Dr. Vernard Gambles 2. Try to avoid hypotension with HD and UF as tolerated 2. Chronic combined systolic and diastolic CHF with significant RV dysfunction.  Heart Failure team following.   3. Atrial flutter/fibrillation - rate controlled on amiodarone.  Off anticoagulation due to recurrent and severe GI bleeding.  Possible Watchman device placement. 4. Anemia of CKD and chronic GI blood loss 5. HTN - stable 6. Cirrhosis - stable, no ascites 7. Hyponatremia - will follow with HD. 8. Disposition - poor overall prognosis given multiple end-organ disease processes and not sure she will tolerate IHD.  Recommend palliative care consult to help set goals/limits of care.   Morgan Potts, MD [name] [phone]

## 2020-07-05 NOTE — Care Management Important Message (Signed)
Important Message  Patient Details  Name: Morgan Clay MRN: [id number] Date of Birth: [birth year]   Medicare Important Message Given:  Yes     Shelda Altes 07/05/2020, 9:32 AM

## 2020-07-05 NOTE — Progress Notes (Signed)
CARDIAC REHAB PHASE I   Went to offer to walk with pt. Pt groggy today from HD cath placement. Encouraged ambulation as able. For first treatment today.  Rufina Falco, RN BSN 07/05/2020 3:26 PM

## 2020-07-05 NOTE — Plan of Care (Signed)
  Problem: Education: Goal: Knowledge of General Education information will improve Description Including pain rating scale, medication(s)/side effects and non-pharmacologic comfort measures Outcome: Progressing   

## 2020-07-05 NOTE — Procedures (Signed)
  Procedure: R IJ tunneled HD CVC 23cm EBL:   minimal Complications:  none immediate  See full dictation in [facility].  Dillard Cannon MD Main # [phone] Pager  [phone] Mobile [phone]

## 2020-07-05 NOTE — Progress Notes (Signed)
No ICM remote transmission received for 07/05/2020 due to currently hospitalized and next ICM transmission scheduled for 07/21/2020.

## 2020-07-05 NOTE — Research (Addendum)
At Home-HF  Telephone Script Day: 17    Date of visit: 07/05/2020                                    Time: 1330                                                Subject ID: [id number]        Visit Performed:  [x]  Yes  []  No  If No,   []  Subject did not respond   []  Subject unavailable   []  Other, specify ___________     Weight and Blood pressure:  1. Did you use the provided scale to weigh yourself this morning? [x]  Yes  []  No   If Yes, Did you document it in the diary? []  Yes [x]  No  What was your weight today? 129 lbs.  If No, please ask to the subject to weigh himself/herself now and obtain the measurement. _____ lbs.     2. Did you use the provided "cuff" to take your Blood Pressure and Heart Rate this morning? [x]  Yes  []  No  If yes, did you document it in the Diary?  []  Yes  [x]  No    What was your Blood Pressure and Heart Rate today?   Heart Rate: 64         Blood Pressure: 137/53         Patient Global Assessment Visual Analog Scale (VAS): Please ask if subject completed the Visual Analog Scale in the Diary.  [x]  Yes []  No If No, Ask to complete now and remind the subject to complete it before next scheduled call or visit.       5-Point Likert Scale: Current Dyspnea Status How much difficulty are you having in breathing now?  [x]  not short of breath               []  mildly short of breath             []  moderately short of breath             []  severely short of breath                  []  very severely short of breath     7-Point Likert Scale: Patient-Assessed Dyspnea Status Compared to how much difficulty you were having with your breathing just before starting in the study, how is your breathing now?  []  Markedly better       [x]  Moderately better       []  Minimally better       []  No change       []  Minimally worse       []  Moderately worse       []  Markedly worse       Medications/Changes and Medical Events:  1. How much oral  diuretic did you take yesterday? No oral diuretic yesterday   2. Did you document in the Diary start time and end time for Furoscix Infusor (if applicable)? []  Yes []  No [x]  N/A   3. Have you had any problems/issues with Infusor? []  Yes []  No [x]  N/A If yes, please describe: ______________________________________________   4. Since your last visit/call, have you started any  new medications or have your medications changed? (review compare to last visit/call)  [x]  Yes []  No If yes, please specify:Sodium chloride 0.9% 250 mL bolus, ferumoxytol, darbepoetin alfa, cefazolin, chlorhexidine gluconate 2% pads, urea oral packet.   5. Since your last visit/call, have you had any medical events, have you visited a hospital, Emergency Department or seen any other health care provider?  [x]  Yes []  No If yes, please describe (including treatments):subject is currently admitted, has been admitted since previous visit   Heidie Pilot  Patient Global Assessment Visual Analog Scale (EQ-VAS)        Subject Number:  [id number]  Date Completed:        07/05/2020  Study Day:   34       . We would like to know how good or bad your health is TODAY. [name] This scale is numbered 0 to 100. . 100 means the best health you can imagine.       0 means the worst health you can imagine. . Please let me know the number you feel indicated how your health is TODAY.    VAS SCORE - YOUR HEALTH TODAY : 50

## 2020-07-06 DIAGNOSIS — I5033 Acute on chronic diastolic (congestive) heart failure: Secondary | ICD-10-CM | POA: Diagnosis not present

## 2020-07-06 LAB — CBC
HCT: 35.4 % — ABNORMAL LOW (ref 36.0–46.0)
Hemoglobin: 11.9 g/dL — ABNORMAL LOW (ref 12.0–15.0)
MCH: 28.6 pg (ref 26.0–34.0)
MCHC: 33.6 g/dL (ref 30.0–36.0)
MCV: 85.1 fL (ref 80.0–100.0)
Platelets: 257 10*3/uL (ref 150–400)
RBC: 4.16 MIL/uL (ref 3.87–5.11)
RDW: 20 % — ABNORMAL HIGH (ref 11.5–15.5)
WBC: 5.9 10*3/uL (ref 4.0–10.5)
nRBC: 0 % (ref 0.0–0.2)

## 2020-07-06 LAB — RENAL FUNCTION PANEL
Albumin: 4 g/dL (ref 3.5–5.0)
Anion gap: 14 (ref 5–15)
BUN: 64 mg/dL — ABNORMAL HIGH (ref 8–23)
CO2: 25 mmol/L (ref 22–32)
Calcium: 9.7 mg/dL (ref 8.9–10.3)
Chloride: 93 mmol/L — ABNORMAL LOW (ref 98–111)
Creatinine, Ser: 2.42 mg/dL — ABNORMAL HIGH (ref 0.44–1.00)
GFR, Estimated: 21 mL/min — ABNORMAL LOW (ref >60)
Glucose, Bld: 112 mg/dL — ABNORMAL HIGH (ref 70–99)
Phosphorus: 2.1 mg/dL — ABNORMAL LOW (ref 2.5–4.6)
Potassium: 3.5 mmol/L (ref 3.5–5.1)
Sodium: 132 mmol/L — ABNORMAL LOW (ref 135–145)

## 2020-07-06 MED ORDER — HEPARIN SODIUM (PORCINE) 1000 UNIT/ML IJ SOLN
INTRAMUSCULAR | Status: AC
  Filled 2020-07-06: qty 4

## 2020-07-06 MED ORDER — OXYCODONE-ACETAMINOPHEN 5-325 MG PO TABS
1.0000 | ORAL_TABLET | Freq: Two times a day (BID) | ORAL | Status: DC | PRN
  Administered 2020-07-06 – 2020-07-08 (×5): 1 via ORAL
  Filled 2020-07-06 (×5): qty 1

## 2020-07-06 NOTE — Progress Notes (Signed)
Patient ID: Morgan Clay, female   DOB: 1945-08-09, 75 y.o.   MRN: [id number] S: complaining of pain at the site of her RIJ TDC  O:BP (!) 128/57 (BP Location: Left Arm)   Pulse 63   Temp 98.6 F (37 C) (Oral)   Resp 20   Ht 4\' 11"  (1.499 m)   Wt 58.3 kg   SpO2 97%   BMI 25.97 kg/m   Intake/Output Summary (Last 24 hours) at 07/06/2020 1243 Last data filed at 07/06/2020 0930 Gross per 24 hour  Intake 376 ml  Output 0 ml  Net 376 ml   Intake/Output: I/O last 3 completed shifts: In: 463 [P.O.:360; I.V.:3; IV Piggyback:100] Out: 200 [Urine:200]  Intake/Output this shift:  Total I/O In: 253 [P.O.:250; I.V.:3] Out: -  Weight change: -0.544 kg Gen: NAD CVS: RRR Resp: cta Abd: +BS, soft, NT/Nd Ext: no edema  Recent Labs  Lab 06/30/20 0336 07/01/20 0333 07/02/20 0309 07/03/20 0242 07/04/20 0401 07/05/20 0254 07/06/20 0400  NA 125* 124* 126* 125* 124* 124* 132*  K 3.9 3.8 4.1 4.4 4.0 4.4 3.5  CL 83* 84* 87* 85* 83* 84* 93*  CO2 26 26 26 25 26 26 25   GLUCOSE 96 109* 105* 105* 117* 108* 112*  BUN 71* 76* 116* 140* 138* 140* 64*  CREATININE 3.68* 4.00* 4.35* 4.52* 4.21* 4.13* 2.42*  ALBUMIN 3.8 3.8 3.8 4.0 4.1 4.0 4.0  CALCIUM 9.7 9.8 9.9 10.2 10.3 10.1 9.7  PHOS 4.2 4.1 3.5 2.7 3.8 4.7* 2.1*   Liver Function Tests: Recent Labs  Lab 07/04/20 0401 07/05/20 0254 07/06/20 0400  ALBUMIN 4.1 4.0 4.0   No results for input(s): LIPASE, AMYLASE in the last 168 hours. No results for input(s): AMMONIA in the last 168 hours. CBC: Recent Labs  Lab 07/03/20 0242 07/04/20 0401 07/05/20 0254 07/06/20 0400  WBC 7.9 7.1 7.0 5.9  HGB 10.7* 11.3* 11.1* 11.9*  HCT 31.7* 34.1* 34.2* 35.4*  MCV 83.4 84.2 84.9 85.1  PLT 250 262 270 257   Cardiac Enzymes: No results for input(s): CKTOTAL, CKMB, CKMBINDEX, TROPONINI in the last 168 hours. CBG: No results for input(s): GLUCAP in the last 168 hours.  Iron Studies: No results for input(s): IRON, TIBC, TRANSFERRIN, FERRITIN in  the last 72 hours. Studies/Results: IR TUNNELED CENTRAL VENOUS CATHETER PLACEMENT  Result Date: 07/05/2020 CLINICAL DATA:  Renal insufficiency, needs access for hemodialysis. Left subclavian AICD. EXAM: TUNNELED HEMODIALYSIS CATHETER PLACEMENT WITH ULTRASOUND AND FLUOROSCOPIC GUIDANCE TECHNIQUE: The procedure, risks, benefits, and alternatives were explained to the patient. Questions regarding the procedure were encouraged and answered. The patient understands and consents to the procedure. As antibiotic prophylaxis, cefazolin 2 g was ordered pre-procedure and administered intravenously within one hour of incision.Patency of the right IJ vein was confirmed with ultrasound with image documentation. An appropriate skin site was determined. Region was prepped using maximum barrier technique including cap and mask, sterile gown, sterile gloves, large sterile sheet, and Chlorhexidine as cutaneous antisepsis. The region was infiltrated locally with 1% lidocaine. Intravenous Fentanyl 73mcg and Versed 1mg  were administered as conscious sedation during continuous monitoring of the patient's level of consciousness and physiological / cardiorespiratory status by the radiology RN, with a total moderate sedation time of 16 minutes. Under real-time ultrasound guidance, the right IJ vein was accessed with a 21 gauge micropuncture needle; the needle tip within the vein was confirmed with ultrasound image documentation. Needle exchanged over the 018 guidewire for transitional dilator, which allowed advancement of a Benson wire  into the IVC. Over this, an MPA catheter was advanced. A Palindrome 23 hemodialysis catheter was tunneled from the right anterior chest wall approach to the right IJ dermatotomy site. The MPA catheter was exchanged over an Amplatz wire for serial vascular dilators which allow placement of a peel-away sheath, through which the catheter was advanced under intermittent fluoroscopy, positioned with its tips in  the proximal and midright atrium. Spot chest radiograph confirms good catheter position. No pneumothorax. Catheter was flushed and primed per protocol. Catheter secured externally with O Prolene sutures. The right IJ dermatotomy site was closed with Dermabond. COMPLICATIONS: COMPLICATIONS None immediate FLUOROSCOPY TIME:  54 seconds; 15 mGy COMPARISON:  None IMPRESSION: 1. Technically successful placement of tunneled right IJ hemodialysis catheter with ultrasound and fluoroscopic guidance. Ready for routine use. ACCESS: Remains approachable for percutaneous intervention as needed. Electronically Signed   By: Lucrezia Europe M.D.   On: 07/05/2020 15:42   . allopurinol  100 mg Oral Daily  . amiodarone  100 mg Oral Daily  . carvedilol  3.125 mg Oral BID WC  . Chlorhexidine Gluconate Cloth  6 each Topical Q0600  . gabapentin  300 mg Oral Daily  . heparin  5,000 Units Subcutaneous Q8H  . heparin sodium (porcine)      . isosorbide-hydrALAZINE  0.5 tablet Oral TID  . levothyroxine  137 mcg Oral Q0600  . pantoprazole  40 mg Oral Daily  . sodium chloride flush  3 mL Intravenous Q12H    BMET    Component Value Date/Time   NA 132 (L) 07/06/2020 0400   NA 140 12/25/2018 1514   K 3.5 07/06/2020 0400   CL 93 (L) 07/06/2020 0400   CO2 25 07/06/2020 0400   GLUCOSE 112 (H) 07/06/2020 0400   BUN 64 (H) 07/06/2020 0400   BUN 33 (H) 12/25/2018 1514   CREATININE 2.42 (H) 07/06/2020 0400   CREATININE 2.24 (H) 02/04/2020 1416   CALCIUM 9.7 07/06/2020 0400   GFRNONAA 21 (L) 07/06/2020 0400   GFRNONAA 21 (L) 02/04/2020 1416   GFRAA 25 (L) 02/04/2020 1416   CBC    Component Value Date/Time   WBC 5.9 07/06/2020 0400   RBC 4.16 07/06/2020 0400   HGB 11.9 (L) 07/06/2020 0400   HGB 11.0 (L) 06/19/2019 1010   HGB 8.4 (L) 10/04/2017 1534   HCT 35.4 (L) 07/06/2020 0400   HCT 26.1 (L) 10/04/2017 1534   PLT 257 07/06/2020 0400   PLT 192 06/19/2019 1010   PLT 201 10/04/2017 1534   MCV 85.1 07/06/2020 0400    MCV 87 10/04/2017 1534   MCH 28.6 07/06/2020 0400   MCHC 33.6 07/06/2020 0400   RDW 20.0 (H) 07/06/2020 0400   RDW 17.3 (H) 10/04/2017 1534   LYMPHSABS 0.7 06/23/2020 1224   LYMPHSABS 2.1 10/04/2017 1534   MONOABS 0.8 06/23/2020 1224   EOSABS 0.1 06/23/2020 1224   EOSABS 0.2 10/04/2017 1534   BASOSABS 0.0 06/23/2020 1224   BASOSABS 0.0 10/04/2017 1534     Assessment/Plan:  1. AKI/CKD stage IV - in setting of ABLA and diastolic CHF (prominent RV dysfunction) without significant recovery.  Mainly cardiorenal physiology with worsening BUN/CR with diuresis.  Planning for first HD today but not clear if she will tolerate it given biventricular failure. Discussed with Ms. Newsome and she is willing to try HD. 1. S/p RIJ TDC placed 07/05/20 by Dr. Vernard Gambles 2. Tolerated first HD session early this morning without issues.  Plan for 2nd tomorrow.  3. Try  to avoid hypotension with HD and UF as tolerated 2. Chronic combined systolic and diastolic CHF with significant RV dysfunction.  Heart Failure team following.   3. Atrial flutter/fibrillation - rate controlled on amiodarone.  Off anticoagulation due to recurrent and severe GI bleeding.  Possible Watchman device placement. 4. Anemia of CKD and chronic GI blood loss 5. HTN - stable 6. Cirrhosis - stable, no ascites 7. Hyponatremia - will follow with HD. 8. Disposition - poor overall prognosis given multiple end-organ disease processes and not sure she will tolerate IHD.  Recommend palliative care consult to help set goals/limits of care.    Donetta Potts, MD [name] [phone]

## 2020-07-06 NOTE — Progress Notes (Signed)
Offered ambulation however pt sts her TDC site is too painful. Encouraged mobility as tolerated.  Yves Dill CES, ACSM 2:00 PM 07/06/2020

## 2020-07-06 NOTE — Progress Notes (Addendum)
Patient ID: Morgan Clay, female   DOB: Nov 22, 1945, 75 y.o.   MRN: [id number]     Advanced Heart Failure Rounding Note  PCP-Cardiologist: No primary care provider on file.   Subjective:    She is s/p successful TDC placement and first dialysis session yesterday.  No UF removed, significant improvement in Na, BUN/Cr.  She is very sore today a little groggy didn't get to dialysis until after midnight.  She denies any chest pain, dyspnea, she said she did well during dialysis had no hypotension, dizziness, cramps.  BP meds were held for dialysis yesterday.     Echo: EF 55-60%, moderate LVH, D-shaped interventricular septum, RV moderately dilated with low normal function, severe biatrial enlargement, severe TR.    RHC Procedural Findings (2/17): Hemodynamics (mmHg) RA mean 21 RV 49/20 PA 59/24, mean 39 PCWP mean 26 Oxygen saturations: PA 64% AO 98% Cardiac Output (Fick) 5.07  Cardiac Index (Fick) 3.23 PVR 2.6 WU Cardiac Output (Thermo) 4.72  Cardiac Index (Thermo) 3.01  PVR 2.75 WU PAPI 1.7  Objective:   Weight Range: 58.3 kg Body mass index is 25.97 kg/m.   Vital Signs:   Temp:  [97.5 F (36.4 C)-98.6 F (37 C)] 98.2 F (36.8 C) (03/01 0827) Pulse Rate:  [59-66] 62 (03/01 0833) Resp:  [12-18] 18 (03/01 0827) BP: (113-167)/(49-67) 154/61 (03/01 0827) SpO2:  [96 %-100 %] 96 % (03/01 0827) Weight:  [58.3 kg] 58.3 kg (03/01 0347) Last BM Date: 07/03/20  Weight change: Filed Weights   07/04/20 0000 07/05/20 0500 07/06/20 0347  Weight: 58.5 kg 58.9 kg 58.3 kg    Intake/Output:   Intake/Output Summary (Last 24 hours) at 07/06/2020 0920 Last data filed at 07/06/2020 0834 Gross per 24 hour  Intake 466 ml  Output 0 ml  Net 466 ml      Physical Exam  Cardiac: normal rate and rhythm, clear s1 and s2, 2/6 TR, no rubs or gallops, no LE edema Pulmonary: CTAB, not in distress Abdominal:  soft and mild tenderness on the right upper quadrant Psych: Alert, conversant, in  good spirits Skin: TDC site clean dry and intact   Telemetry   A-V paced 60s   EKG    No new EKG to review today   Labs    CBC Recent Labs    07/05/20 0254 07/06/20 0400  WBC 7.0 5.9  HGB 11.1* 11.9*  HCT 34.2* 35.4*  MCV 84.9 85.1  PLT 270 099   Basic Metabolic Panel Recent Labs    07/05/20 0254 07/06/20 0400  NA 124* 132*  K 4.4 3.5  CL 84* 93*  CO2 26 25  GLUCOSE 108* 112*  BUN 140* 64*  CREATININE 4.13* 2.42*  CALCIUM 10.1 9.7  PHOS 4.7* 2.1*   Liver Function Tests Recent Labs    07/05/20 0254 07/06/20 0400  ALBUMIN 4.0 4.0   No results for input(s): LIPASE, AMYLASE in the last 72 hours. Cardiac Enzymes No results for input(s): CKTOTAL, CKMB, CKMBINDEX, TROPONINI in the last 72 hours.  BNP: BNP (last 3 results) Recent Labs    06/18/20 1300 06/21/20 1501 06/23/20 1224  BNP 1,147.8* 981.7* 1,099.2*    ProBNP (last 3 results) Recent Labs    07/17/19 1352 07/23/19 1033  PROBNP 811.0* 637.0*     D-Dimer No results for input(s): DDIMER in the last 72 hours. Hemoglobin A1C No results for input(s): HGBA1C in the last 72 hours. Fasting Lipid Panel No results for input(s): CHOL, HDL, LDLCALC, TRIG, CHOLHDL, LDLDIRECT  in the last 72 hours. Thyroid Function Tests No results for input(s): TSH, T4TOTAL, T3FREE, THYROIDAB in the last 72 hours.  Invalid input(s): FREET3  Other results:   Imaging    IR TUNNELED CENTRAL VENOUS CATHETER PLACEMENT  Result Date: 07/05/2020 CLINICAL DATA:  Renal insufficiency, needs access for hemodialysis. Left subclavian AICD. EXAM: TUNNELED HEMODIALYSIS CATHETER PLACEMENT WITH ULTRASOUND AND FLUOROSCOPIC GUIDANCE TECHNIQUE: The procedure, risks, benefits, and alternatives were explained to the patient. Questions regarding the procedure were encouraged and answered. The patient understands and consents to the procedure. As antibiotic prophylaxis, cefazolin 2 g was ordered pre-procedure and administered  intravenously within one hour of incision.Patency of the right IJ vein was confirmed with ultrasound with image documentation. An appropriate skin site was determined. Region was prepped using maximum barrier technique including cap and mask, sterile gown, sterile gloves, large sterile sheet, and Chlorhexidine as cutaneous antisepsis. The region was infiltrated locally with 1% lidocaine. Intravenous Fentanyl 11mcg and Versed 1mg  were administered as conscious sedation during continuous monitoring of the patient's level of consciousness and physiological / cardiorespiratory status by the radiology RN, with a total moderate sedation time of 16 minutes. Under real-time ultrasound guidance, the right IJ vein was accessed with a 21 gauge micropuncture needle; the needle tip within the vein was confirmed with ultrasound image documentation. Needle exchanged over the 018 guidewire for transitional dilator, which allowed advancement of a Benson wire into the IVC. Over this, an MPA catheter was advanced. A Palindrome 23 hemodialysis catheter was tunneled from the right anterior chest wall approach to the right IJ dermatotomy site. The MPA catheter was exchanged over an Amplatz wire for serial vascular dilators which allow placement of a peel-away sheath, through which the catheter was advanced under intermittent fluoroscopy, positioned with its tips in the proximal and midright atrium. Spot chest radiograph confirms good catheter position. No pneumothorax. Catheter was flushed and primed per protocol. Catheter secured externally with O Prolene sutures. The right IJ dermatotomy site was closed with Dermabond. COMPLICATIONS: COMPLICATIONS None immediate FLUOROSCOPY TIME:  54 seconds; 15 mGy COMPARISON:  None IMPRESSION: 1. Technically successful placement of tunneled right IJ hemodialysis catheter with ultrasound and fluoroscopic guidance. Ready for routine use. ACCESS: Remains approachable for percutaneous intervention as  needed. Electronically Signed   By: Lucrezia Europe M.D.   On: 07/05/2020 15:42     Medications:     Scheduled Medications:  allopurinol  100 mg Oral Daily   amiodarone  100 mg Oral Daily   carvedilol  3.125 mg Oral BID WC   Chlorhexidine Gluconate Cloth  6 each Topical Q0600   gabapentin  300 mg Oral Daily   heparin  5,000 Units Subcutaneous Q8H   heparin sodium (porcine)       isosorbide-hydrALAZINE  0.5 tablet Oral TID   levothyroxine  137 mcg Oral Q0600   pantoprazole  40 mg Oral Daily   sodium chloride flush  3 mL Intravenous Q12H    Infusions:  sodium chloride     sodium chloride      PRN Medications: sodium chloride, sodium chloride, acetaminophen, ondansetron (ZOFRAN) IV, sodium chloride flush, traMADol, traZODone    Assessment/Plan   1. Chronic primarily diastolic CHF with prominent RV dysfunction: Patient has history of presumed nonischemic cardiomyopathy, EF as low at 25-30% in 12/15.  Medtronic CRT-D device placed with improvement in LV function, echo in 4/19 showed EF improved to 60-65%. PYP scan in 3/20 was equivocal for transthyretin amyloidosis, PYP scan in 1/22 was not  suggestive of transthyretin amyloidosis. LHC/RHC in 8/21 showed nonobstructive CAD, normal filling pressures and cardiac output. Echo 8/21 showed EF 50-55%, mildly decreased RV systolic function, moderate AI. NYHA class IIIb symptoms with progressive volume overload and renal failure recently.  [place] 2/16 showed elevated R>L heart filling pressures with low PAPI at 1.7 and preserved cardiac output. V/Q scan not suggestive of chronic PEs.  Decision made to admit for diuresis and monitoring of renal function. Repeat echo in hospital showed EF 55-60%, moderate LVH, D-shaped interventricular septum, RV moderately dilated with low normal function, severe biatrial enlargement, severe TR => confirming prominent RV failure.  Breathing significantly better since admission  -TDC placed yesterday and  also had first HD session, no UF removed weight stable, still making urine - Continue Bidil 1/2 tab tid.  - Continue coreg 3.125mg  2. Atrial flutter/fibrillation: History of atypical flutter and atrial fibrillation.Lately has been in NSR on amiodarone 100 mg daily. She is off apixaban with recurrent severe GI bleeding.  - Continue amiodarone.  - Has been referred to Dr. Quentin Ore for evaluation for Watchman device. 3. CKD stage V: Followed by nephrology as outpatient. Creatinine worsened during admission - now ESRD, first dialysis session yesterday went well, Na, BUN/Cr improvement no UF removed - Nephrology following.  - Awaiting CLIP  4. Anemia: Recurrent severe GI bleeding. She is now off Eliquis. Transferrin saturation 5% at admission.  - Received Feraheme 2/16  - As above, Watchman evaluation eventually. 5. Hypertension:stable/ improved w/ Bidil dose reduction     - continue on reduced Bidil 1/2 tab tid, carvedilol 3.125mg  BID  6. Cirrhosis -was not able to order elastography inpatient but US shows stable, compensated cirrhosis with normal doppler studies with no ascites 7. Hyponatremia -improved with dialysis 124-->132  Length of Stay: 13  Katherine Roan, MD  07/06/2020, 9:20 AM  Advanced Heart Failure Team Pager [phone] (M-F; [place])  Please contact [facility] for night-coverage after hours (4p -7a ) and weekends on [email or web address]  Agree with the above note.  Tolerate 1st HD last night. Stable from our standpoint, further plan per renal.   Loralie Champagne 07/06/2020

## 2020-07-07 DIAGNOSIS — N179 Acute kidney failure, unspecified: Secondary | ICD-10-CM | POA: Diagnosis not present

## 2020-07-07 DIAGNOSIS — Z7189 Other specified counseling: Secondary | ICD-10-CM

## 2020-07-07 DIAGNOSIS — Z515 Encounter for palliative care: Secondary | ICD-10-CM

## 2020-07-07 DIAGNOSIS — I5033 Acute on chronic diastolic (congestive) heart failure: Secondary | ICD-10-CM | POA: Diagnosis not present

## 2020-07-07 LAB — CBC
HCT: 35.6 % — ABNORMAL LOW (ref 36.0–46.0)
Hemoglobin: 11.1 g/dL — ABNORMAL LOW (ref 12.0–15.0)
MCH: 27.8 pg (ref 26.0–34.0)
MCHC: 31.2 g/dL (ref 30.0–36.0)
MCV: 89.2 fL (ref 80.0–100.0)
Platelets: 221 10*3/uL (ref 150–400)
RBC: 3.99 MIL/uL (ref 3.87–5.11)
RDW: 20.4 % — ABNORMAL HIGH (ref 11.5–15.5)
WBC: 6.3 10*3/uL (ref 4.0–10.5)
nRBC: 0 % (ref 0.0–0.2)

## 2020-07-07 LAB — RENAL FUNCTION PANEL
Albumin: 3.9 g/dL (ref 3.5–5.0)
Anion gap: 12 (ref 5–15)
BUN: 74 mg/dL — ABNORMAL HIGH (ref 8–23)
CO2: 24 mmol/L (ref 22–32)
Calcium: 9.6 mg/dL (ref 8.9–10.3)
Chloride: 93 mmol/L — ABNORMAL LOW (ref 98–111)
Creatinine, Ser: 2.98 mg/dL — ABNORMAL HIGH (ref 0.44–1.00)
GFR, Estimated: 16 mL/min — ABNORMAL LOW (ref >60)
Glucose, Bld: 102 mg/dL — ABNORMAL HIGH (ref 70–99)
Phosphorus: 3.9 mg/dL (ref 2.5–4.6)
Potassium: 4.3 mmol/L (ref 3.5–5.1)
Sodium: 129 mmol/L — ABNORMAL LOW (ref 135–145)

## 2020-07-07 MED ORDER — HEPARIN SODIUM (PORCINE) 1000 UNIT/ML IJ SOLN
INTRAMUSCULAR | Status: AC
  Filled 2020-07-07: qty 4

## 2020-07-07 NOTE — Progress Notes (Incomplete)
Pharmacy Rounding Learning Note - not an active part of the chart  CC/HPI: Presented to ED on 06/23/20 with chronic combined systolic and diastolic CHF of non-ischemic origin (cath lab). Remained admitted for diuresis and monitoring of kidney function.   PMH: CKD stage 4, CHF, HTN, hypothyroidism, T2DM controlled with diet, Afib   Anticoag: currently on heparin 5,000 units Q8H and SCD; pt not taking Eliquis d/t recurrent GIB ID:  CV: ECHO 06/24/20 showed EF 55 to 60%, RV low normal, moderately enlarged Endo: FBG levels have looked good  GI/Nutrition:  Neuro:  Renal:  Cause of AKI: likely cardiorenal syndrome  SCr: baseline - 2.5  Currently on dialysis daily since 3/1    SCr 4.61 on 3/5 after no dialysis on 3/4  Dialysis:   Type: hemodialysis    Access type: tunneled cath in right jug vein   Days: 3/1 (250 mL/min, 00:30 to 2:45)  3/2 (250 mL/min, 9:30 to 12:15) 3/3 (300 mL/min, 7:30 to 10:45) 3/5 (400 mL/min, 5:00 to 8:15) 3/7 (  Urine output: (no Foley cath noted)  3/2: 200 mL urine, 1,000 mL other  3/3: 160 mL urine, 1,000 mL other  3/4: 0 mL urine  3/5: 0 mL urine  3/6: 50 mL  3/7: none noted yet  Electrolytes: (07/11/20)  Sodium: 128 (low)  Potassium: 3.8  Calcium: 9.6  Magnesium:   Phosphorus: 4.2    Pulm:  Heme/Onc:  Hgb: 11.5 (3/5, stable) Ferritin:  TSAT:   PTA Med Issues:  TOC Issues:  Best Practices:  Panic Labs:  HRM:  IV-PO:   Renal Adjustment Meds: Allopurinol Gabapentin Hydromorphone Tramadol  Plan:  - Pt was educated on care of tunneled dialysis catheter, medication administration on hemodialysis days, and importance of adherence to dialysis schedule (3/3 by RN) - Dialysis today - She has been tolerating dialysis well - Needs AVF/AVG placement given CKD stage 4 - Renal navigator assisting with outpatient HD MWF schedule at [facility] - Has an outpatient appointment today (3/7)

## 2020-07-07 NOTE — Progress Notes (Signed)
Manufacturing engineer [facility]) Hospital Liaison RN note         Notified by [facility] manager of patient/family request for [facility] Palliative services at home after discharge.         Writer spoke with patient's daughter Ms Cheri Rous to confirm interest and explain services.               [place] Palliative team will follow up with patient after discharge.         Please call with any hospice or palliative related questions.         Thank you for the opportunity to participate in this patient's care.     Domenic Moras, BSN, RN [facility] Liaison (listed on [place] under Hospice/[facility])    [phone] (24h on call)

## 2020-07-07 NOTE — Plan of Care (Signed)
A&Ox4. VSS on RA. C/o pain and stiffness to right neck, PRN percocet and ice pack effective. Pacing on tele. No acute events this shift.   Problem: Education: Goal: Knowledge of General Education information will improve Description: Including pain rating scale, medication(s)/side effects and non-pharmacologic comfort measures Outcome: Progressing   Problem: Health Behavior/Discharge Planning: Goal: Ability to manage health-related needs will improve Outcome: Progressing   Problem: Clinical Measurements: Goal: Ability to maintain clinical measurements within normal limits will improve Outcome: Progressing Goal: Diagnostic test results will improve Outcome: Progressing Goal: Respiratory complications will improve Outcome: Progressing Goal: Cardiovascular complication will be avoided Outcome: Progressing   Problem: Activity: Goal: Risk for activity intolerance will decrease Outcome: Progressing   Problem: Elimination: Goal: Will not experience complications related to urinary retention Outcome: Progressing   Problem: Education: Goal: Ability to demonstrate management of disease process will improve Outcome: Progressing Goal: Ability to verbalize understanding of medication therapies will improve Outcome: Progressing Goal: Individualized Educational Video(s) Outcome: Progressing   Problem: Activity: Goal: Capacity to carry out activities will improve Outcome: Progressing   Problem: Cardiac: Goal: Ability to achieve and maintain adequate cardiopulmonary perfusion will improve Outcome: Progressing

## 2020-07-07 NOTE — Consult Note (Signed)
Consultation Note Date: 07/07/2020   Patient Name: Morgan Clay  DOB: 11-23-1945  MRN: [id number]  Age / Sex: 75 y.o., female  PCP: Morgan Ruddy, MD Referring Physician: [name], MD  Reason for Consultation: Establishing goals of care  HPI/Patient Profile: 75 y.o. female  with past medical history of nonischemic cardiomyopathy with recovery of EF with BiV pacing, recurrent GI bleeding and anemia, cirrhosis, HTN, and paroxysmal atrial flutter admitted on 06/23/2020 with worsening exertional dyspnea and worsening renal failure. Dialysis catheter placed 2/28 and first dialysis session 3/1; second on 3/2.Tolerating HD so far. PMT consulted to discuss [place] d/t multiple end-organ disease processes.   Clinical Assessment and Goals of Care: I have reviewed medical records including EPIC notes, labs and imaging, received report from RN, assessed the patient and then met with patient and daughter, Morgan Clay,  to discuss diagnosis prognosis, GOC, EOL wishes, disposition and options.  I introduced Palliative Medicine as specialized medical care for people living with serious illness. It focuses on providing relief from the symptoms and stress of a serious illness. The goal is to improve quality of life for both the patient and the family.  Patient tells me she lives with Morgan Clay. She tells me for the past 1-2 months she has been doing "terrible". She mostly complains of shortness of breath - physical activity extremely limited by this. For example, she makes up half of her bed then has to take a break before making up the other side. She is able to toilet and bathe herself. She tells me of early satiety - eats frequent, small meals. Denies weight loss.    We discussed patient's current illness and what it means in the larger context of patient's on-going co-morbidities. Discuss multiple organ disease processes and how these all affect one another.  Patient and daughter express understanding. We discuss HD - she feels like she is tolerating it well so far and would like to continue.   I attempted to elicit values and goals of care important to the patient.  She tells me her goal is to "be well".  The difference between aggressive medical intervention and comfort care was considered in light of the patient's goals of care.  Patient and daughter both express interest in all offered medical treatment to prolong life.    I completed a MOST form today. The patient and family outlined their wishes for the following treatment decisions:  Cardiopulmonary Resuscitation: Attempt Resuscitation (CPR)  Medical Interventions: Full Scope of Treatment: Use intubation, advanced airway interventions, mechanical ventilation, cardioversion as indicated, medical treatment, IV fluids, etc, also provide comfort measures. Transfer to the hospital if indicated  Antibiotics: Antibiotics if indicated  IV Fluids: IV fluids if indicated  Feeding Tube: Feeding tube for a defined trial period   Ms. Neider shares her daughter, Morgan Clay, is her next of kin and she would want her to make medical decisions for her if she were ever unable.   Discussed with patient/family the importance of continued conversation with family and the medical providers regarding overall plan of care and treatment options, ensuring decisions are within the context of the patient's values and GOCs.    Palliative Care services outpatient were explained and offered.  Patient is agreeable.   Patient is adamant that she will never go to a facility - only interested in home. Daughter agrees. Agreeable to home health follow up if needed.   Questions and concerns were addressed. The family was encouraged to call with questions or  concerns.   Primary Decision Maker PATIENT   SUMMARY OF RECOMMENDATIONS   - full code/full scope - will request palliative follow outpatient - MOST completed - she  feels like she is tolerating HD well so far and would like to continue   Code Status/Advance Care Planning:  Full code  Discharge Planning: Home with Palliative Services , home health?     Primary Diagnoses: Present on Admission: . Acute on chronic diastolic CHF (congestive heart failure) ([place])   I have reviewed the medical record, interviewed the patient and family, and examined the patient. The following aspects are pertinent.  Past Medical History:  Diagnosis Date  . A-fib ([place])    on Eliquis  . AICD (automatic cardioverter/defibrillator) present 10/06/2015  . Anemia   . Angiodysplasia of small intestine ([place]) 06/10/2020   Ileum - seen on capsule endoscopy 05/2020 - ablated at colonoscopy  . ARF (acute renal failure) ([place])   . Arthritis    "qwhere" (01/03/2018)  . Asthma    reports mild asthma since childhood - had COPD on dx list from prior PCP  . Chronic bronchitis ([place])    "get it most years; not this past year though" (01/03/2018)  . Chronic kidney disease    "? stage;  followed by [facility]; Dr. Lorrene Clay" (01/03/2018)  . Chronic systolic congestive heart failure ([place])   . COVID-19 05/03/2020  . DEPRESSION 12/17/2009   Annotation: PHQ-9 score = 14 done on 12/17/2009 Qualifier: Diagnosis of  By: Hassell Done FNP, Tori Milks    . Diabetes mellitus without complication (HCC)    DIET CONTROLLED   . Diverticulosis   . Enteritis   . FIBROIDS, UTERUS 03/05/2008  . GI bleeding 01/03/2018  . Glaucoma   . Gout   . Hepatitis C    HEPATITIS C - s/p treatment with Harvoni, saw hepatology, Morgan Clay  . History of blood transfusion ~ 11/2017  . Hyperlipemia 12/06/2012  . HYPERTENSION, BENIGN 04/24/2007  . Hypothyroidism   . LBBB (left bundle branch block)   . Lower GI bleeding    "been dealing w/it since 07/2017" (01/03/2018)  . Nonischemic cardiomyopathy ([place])   . OBESITY 05/27/2009  . OBSTRUCTIVE SLEEP APNEA 11/14/2007   no CPAP  . OSTEOPENIA 09/30/2008  . Pain and swelling of  left upper extremity 08/08/2017  . Personal history of other infectious and parasitic disease    Hepatitis B  . Pneumonia    "several times" (01/03/2018)  . Pulmonary edema, acute ([place]) 08/09/2017   Social History   Socioeconomic History  . Marital status: Divorced    Spouse name: Not on file  . Number of children: 1  . Years of education: 69  . Highest education level: Not on file  Occupational History  . Not on file  Tobacco Use  . Smoking status: Never Smoker  . Smokeless tobacco: Never Used  Vaping Use  . Vaping Use: Never used  Substance and Sexual Activity  . Alcohol use: Yes    Comment: 01/03/2018 "couple drinks/month"  . Drug use: Yes    Types: Marijuana    Comment: VERY INTERMITTENT   . Sexual activity: Not Currently  Other Topics Concern  . Not on file  Social History Narrative   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      Home Situation: lives alone      Spiritual Beliefs: [place]      Lifestyle: no regular exercise; poor diet      She is divorced  at least one adult child   Never smoker    rare alcohol to occasional at best    no substance abuse         Social Determinants of Health   Financial Resource Strain: Low Risk   . Difficulty of Paying Living Expenses: Not hard at all  Food Insecurity: No Food Insecurity  . Worried About Charity fundraiser in the Last Year: Never true  . Ran Out of Food in the Last Year: Never true  Transportation Needs: No Transportation Needs  . Lack of Transportation (Medical): No  . Lack of Transportation (Non-Medical): No  Physical Activity: Not on file  Stress: Not on file  Social Connections: Not on file   Family History  Problem Relation Age of Onset  . Asthma Father   . Heart attack Father   . Asthma Sister   . Lung cancer Sister   . Heart attack Mother   . Stroke Brother   . Colon cancer Neg Hx   . Esophageal cancer Neg Hx   . Pancreatic cancer Neg Hx   . Stomach cancer Neg Hx   . Liver  disease Neg Hx    Scheduled Meds: . allopurinol  100 mg Oral Daily  . amiodarone  100 mg Oral Daily  . carvedilol  3.125 mg Oral BID WC  . Chlorhexidine Gluconate Cloth  6 each Topical Q0600  . gabapentin  300 mg Oral Daily  . heparin  5,000 Units Subcutaneous Q8H  . heparin sodium (porcine)      . isosorbide-hydrALAZINE  0.5 tablet Oral TID  . levothyroxine  137 mcg Oral Q0600  . pantoprazole  40 mg Oral Daily  . sodium chloride flush  3 mL Intravenous Q12H   Continuous Infusions: . sodium chloride    . sodium chloride     PRN Meds:.sodium chloride, sodium chloride, acetaminophen, ondansetron (ZOFRAN) IV, oxyCODONE-acetaminophen, sodium chloride flush, traMADol, traZODone No Known Allergies Review of Systems  Constitutional: Positive for activity change, appetite change and fatigue.  HENT:       Pain in neck at [facility] insertion  Respiratory: Positive for shortness of breath.   Neurological: Positive for weakness.    Physical Exam Constitutional:      General: She is not in acute distress. Pulmonary:     Effort: Pulmonary effort is normal. No respiratory distress.  Skin:    General: Skin is warm and dry.  Neurological:     Mental Status: She is alert and oriented to person, place, and time.  Psychiatric:        Mood and Affect: Mood normal.        Behavior: Behavior normal.     Vital Signs: BP (!) 149/54 (BP Location: Left Arm)   Pulse 60   Temp (!) 97.4 F (36.3 C) (Oral)   Resp 18   Ht '4\' 11"'  (1.499 m)   Wt 57 kg   SpO2 100%   BMI 25.38 kg/m  Pain Scale: 0-10 POSS *See Group Information*: S-Acceptable,Sleep, easy to arouse Pain Score: 0-No pain   SpO2: SpO2: 100 % O2 Device:SpO2: 100 % O2 Flow Rate: .O2 Flow Rate (L/min): 2 L/min  IO: Intake/output summary:   Intake/Output Summary (Last 24 hours) at 07/07/2020 1507 Last data filed at 07/07/2020 1207 Gross per 24 hour  Intake -  Output 1200 ml  Net -1200 ml    LBM: Last BM Date: 07/06/20 Baseline  Weight: Weight: 61.7 kg Most recent weight: Weight: 57 kg  Palliative Assessment/Data: PPS 50%    Time Total: 60 minutes Greater than 50%  of this time was spent counseling and coordinating care related to the above assessment and plan.  Juel Burrow, DNP, AGNP-C Palliative Medicine Team [phone] Pager: [phone]

## 2020-07-07 NOTE — Progress Notes (Signed)
Patient ID: Morgan Clay, female   DOB: 1945/09/19, 75 y.o.   MRN: [id number] S: Seen while on HD and complaining of pain at site of RIJ TDC. O:BP (!) 149/54 (BP Location: Left Arm)   Pulse 60   Temp (!) 97.4 F (36.3 C) (Oral)   Resp 18   Ht 4\' 11"  (1.499 m)   Wt 57 kg   SpO2 100%   BMI 25.38 kg/m   Intake/Output Summary (Last 24 hours) at 07/07/2020 1338 Last data filed at 07/07/2020 1207 Gross per 24 hour  Intake --  Output 1200 ml  Net -1200 ml   Intake/Output: I/O last 3 completed shifts: In: 373 [P.O.:370; I.V.:3] Out: 0   Intake/Output this shift:  Total I/O In: -  Out: 1200 [Urine:200; Other:1000] Weight change: 0.168 kg Gen: nad CVS: rrr Resp: cta Abd: benign Ext: no edema  Recent Labs  Lab 07/01/20 0333 07/02/20 0309 07/03/20 0242 07/04/20 0401 07/05/20 0254 07/06/20 0400 07/07/20 0409  NA 124* 126* 125* 124* 124* 132* 129*  K 3.8 4.1 4.4 4.0 4.4 3.5 4.3  CL 84* 87* 85* 83* 84* 93* 93*  CO2 26 26 25 26 26 25 24   GLUCOSE 109* 105* 105* 117* 108* 112* 102*  BUN 76* 116* 140* 138* 140* 64* 74*  CREATININE 4.00* 4.35* 4.52* 4.21* 4.13* 2.42* 2.98*  ALBUMIN 3.8 3.8 4.0 4.1 4.0 4.0 3.9  CALCIUM 9.8 9.9 10.2 10.3 10.1 9.7 9.6  PHOS 4.1 3.5 2.7 3.8 4.7* 2.1* 3.9   Liver Function Tests: Recent Labs  Lab 07/05/20 0254 07/06/20 0400 07/07/20 0409  ALBUMIN 4.0 4.0 3.9   No results for input(s): LIPASE, AMYLASE in the last 168 hours. No results for input(s): AMMONIA in the last 168 hours. CBC: Recent Labs  Lab 07/03/20 0242 07/04/20 0401 07/05/20 0254 07/06/20 0400 07/07/20 0956  WBC 7.9 7.1 7.0 5.9 6.3  HGB 10.7* 11.3* 11.1* 11.9* 11.1*  HCT 31.7* 34.1* 34.2* 35.4* 35.6*  MCV 83.4 84.2 84.9 85.1 89.2  PLT 250 262 270 257 221   Cardiac Enzymes: No results for input(s): CKTOTAL, CKMB, CKMBINDEX, TROPONINI in the last 168 hours. CBG: No results for input(s): GLUCAP in the last 168 hours.  Iron Studies: No results for input(s): IRON, TIBC,  TRANSFERRIN, FERRITIN in the last 72 hours. Studies/Results: No results found. [name] allopurinol  100 mg Oral Daily  . amiodarone  100 mg Oral Daily  . carvedilol  3.125 mg Oral BID WC  . Chlorhexidine Gluconate Cloth  6 each Topical Q0600  . gabapentin  300 mg Oral Daily  . heparin  5,000 Units Subcutaneous Q8H  . heparin sodium (porcine)      . isosorbide-hydrALAZINE  0.5 tablet Oral TID  . levothyroxine  137 mcg Oral Q0600  . pantoprazole  40 mg Oral Daily  . sodium chloride flush  3 mL Intravenous Q12H    BMET    Component Value Date/Time   NA 129 (L) 07/07/2020 0409   NA 140 12/25/2018 1514   K 4.3 07/07/2020 0409   CL 93 (L) 07/07/2020 0409   CO2 24 07/07/2020 0409   GLUCOSE 102 (H) 07/07/2020 0409   BUN 74 (H) 07/07/2020 0409   BUN 33 (H) 12/25/2018 1514   CREATININE 2.98 (H) 07/07/2020 0409   CREATININE 2.24 (H) 02/04/2020 1416   CALCIUM 9.6 07/07/2020 0409   GFRNONAA 16 (L) 07/07/2020 0409   GFRNONAA 21 (L) 02/04/2020 1416   GFRAA 25 (L) 02/04/2020 1416   CBC  Component Value Date/Time   WBC 6.3 07/07/2020 0956   RBC 3.99 07/07/2020 0956   HGB 11.1 (L) 07/07/2020 0956   HGB 11.0 (L) 06/19/2019 1010   HGB 8.4 (L) 10/04/2017 1534   HCT 35.6 (L) 07/07/2020 0956   HCT 26.1 (L) 10/04/2017 1534   PLT 221 07/07/2020 0956   PLT 192 06/19/2019 1010   PLT 201 10/04/2017 1534   MCV 89.2 07/07/2020 0956   MCV 87 10/04/2017 1534   MCH 27.8 07/07/2020 0956   MCHC 31.2 07/07/2020 0956   RDW 20.4 (H) 07/07/2020 0956   RDW 17.3 (H) 10/04/2017 1534   LYMPHSABS 0.7 06/23/2020 1224   LYMPHSABS 2.1 10/04/2017 1534   MONOABS 0.8 06/23/2020 1224   EOSABS 0.1 06/23/2020 1224   EOSABS 0.2 10/04/2017 1534   BASOSABS 0.0 06/23/2020 1224   BASOSABS 0.0 10/04/2017 1534     Assessment/Plan:  1. AKI/CKD stage IV - in setting of ABLA and diastolic CHF (prominent RV dysfunction) without significant recovery. Mainly cardiorenal physiology with worsening BUN/CR with diuresis.  Planning for first HD today but not clear if she will tolerate it given biventricular failure. Discussed with Morgan Clay and she is willing to try HD. 1. S/p RIJ TDC placed 07/05/20 by Dr. Vernard Gambles 2. First HD session 07/06/20 and tolerated it well and had second today. 3. Plan for 3rd tomorrow.  4. Try to avoid hypotension with HD and UF as tolerated 2. Chronic combined systolic and diastolic CHF with significant RV dysfunction. Heart Failure team following.  3. Atrial flutter/fibrillation - rate controlled on amiodarone. Off anticoagulation due to recurrent and severe GI bleeding. Possible Watchman device placement. 4. Anemia of CKD and chronic GI blood loss 5. HTN - stable 6. Cirrhosis - stable, no ascites 7. Hyponatremia - will follow with HD. 8. Disposition - poor overall prognosis given multiple end-organ disease processes and not sure she will tolerate IHD. Recommend palliative care consult to help set goals/limits of care.    Donetta Potts, MD [name] [phone]

## 2020-07-07 NOTE — Progress Notes (Addendum)
Patient ID: Morgan Clay, female   DOB: 1945/08/25, 75 y.o.   MRN: [id number]     Advanced Heart Failure Rounding Note  PCP-Cardiologist: No primary care provider on file.   Subjective:    -2/28 TDC placement and first dialysis session  Continues to have pain at [facility] site but otherwise doing well.  Still making urine, weight stable.  Dialysis session yesterday with minimal toxin clearance, no UF removed.  Breathing well, denies chest pain or dyspnea.     Echo: EF 55-60%, moderate LVH, D-shaped interventricular septum, RV moderately dilated with low normal function, severe biatrial enlargement, severe TR.    RHC Procedural Findings (2/17): Hemodynamics (mmHg) RA mean 21 RV 49/20 PA 59/24, mean 39 PCWP mean 26 Oxygen saturations: PA 64% AO 98% Cardiac Output (Fick) 5.07  Cardiac Index (Fick) 3.23 PVR 2.6 WU Cardiac Output (Thermo) 4.72  Cardiac Index (Thermo) 3.01  PVR 2.75 WU PAPI 1.7  Objective:   Weight Range: 58.5 kg Body mass index is 26.05 kg/m.   Vital Signs:   Temp:  [97.7 F (36.5 C)-98.6 F (37 C)] 97.7 F (36.5 C) (03/02 0521) Pulse Rate:  [59-63] 59 (03/02 0521) Resp:  [18-20] 18 (03/02 0521) BP: (128-148)/(56-61) 140/61 (03/02 0521) SpO2:  [96 %-100 %] 96 % (03/02 0521) Weight:  [58.5 kg] 58.5 kg (03/02 0521) Last BM Date: 07/06/20  Weight change: Filed Weights   07/05/20 0500 07/06/20 0347 07/07/20 0521  Weight: 58.9 kg 58.3 kg 58.5 kg    Intake/Output:   Intake/Output Summary (Last 24 hours) at 07/07/2020 0925 Last data filed at 07/07/2020 0800 Gross per 24 hour  Intake 250 ml  Output 200 ml  Net 50 ml      Physical Exam  Cardiac: normal rate and rhythm, clear s1 and s2, 2/6 TR, no rubs or gallops, no LE edema Pulmonary: CTAB, not in distress Abdominal:  soft and mild tenderness on the right upper quadrant Psych: Alert, conversant, in good spirits Skin: TDC site clean dry and intact   Telemetry   A-V paced 60s   EKG    No  new EKG to review today   Labs    CBC Recent Labs    07/05/20 0254 07/06/20 0400  WBC 7.0 5.9  HGB 11.1* 11.9*  HCT 34.2* 35.4*  MCV 84.9 85.1  PLT 270 188   Basic Metabolic Panel Recent Labs    07/06/20 0400 07/07/20 0409  NA 132* 129*  K 3.5 4.3  CL 93* 93*  CO2 25 24  GLUCOSE 112* 102*  BUN 64* 74*  CREATININE 2.42* 2.98*  CALCIUM 9.7 9.6  PHOS 2.1* 3.9   Liver Function Tests Recent Labs    07/06/20 0400 07/07/20 0409  ALBUMIN 4.0 3.9   No results for input(s): LIPASE, AMYLASE in the last 72 hours. Cardiac Enzymes No results for input(s): CKTOTAL, CKMB, CKMBINDEX, TROPONINI in the last 72 hours.  BNP: BNP (last 3 results) Recent Labs    06/18/20 1300 06/21/20 1501 06/23/20 1224  BNP 1,147.8* 981.7* 1,099.2*    ProBNP (last 3 results) Recent Labs    07/17/19 1352 07/23/19 1033  PROBNP 811.0* 637.0*     D-Dimer No results for input(s): DDIMER in the last 72 hours. Hemoglobin A1C No results for input(s): HGBA1C in the last 72 hours. Fasting Lipid Panel No results for input(s): CHOL, HDL, LDLCALC, TRIG, CHOLHDL, LDLDIRECT in the last 72 hours. Thyroid Function Tests No results for input(s): TSH, T4TOTAL, T3FREE, THYROIDAB in the last  72 hours.  Invalid input(s): FREET3  Other results:   Imaging    No results found.   Medications:     Scheduled Medications: . allopurinol  100 mg Oral Daily  . amiodarone  100 mg Oral Daily  . carvedilol  3.125 mg Oral BID WC  . Chlorhexidine Gluconate Cloth  6 each Topical Q0600  . gabapentin  300 mg Oral Daily  . heparin  5,000 Units Subcutaneous Q8H  . isosorbide-hydrALAZINE  0.5 tablet Oral TID  . levothyroxine  137 mcg Oral Q0600  . pantoprazole  40 mg Oral Daily  . sodium chloride flush  3 mL Intravenous Q12H    Infusions: . sodium chloride    . sodium chloride      PRN Medications: sodium chloride, sodium chloride, acetaminophen, ondansetron (ZOFRAN) IV, oxyCODONE-acetaminophen,  sodium chloride flush, traMADol, traZODone    Assessment/Plan   1. Chronic primarily diastolic CHF with prominent RV dysfunction: Patient has history of presumed nonischemic cardiomyopathy, EF as low at 25-30% in 12/15.  Medtronic CRT-D device placed with improvement in LV function, echo in 4/19 showed EF improved to 60-65%. PYP scan in 3/20 was equivocal for transthyretin amyloidosis, PYP scan in 1/22 was not suggestive of transthyretin amyloidosis. LHC/RHC in 8/21 showed nonobstructive CAD, normal filling pressures and cardiac output. Echo 8/21 showed EF 50-55%, mildly decreased RV systolic function, moderate AI. NYHA class IIIb symptoms with progressive volume overload and renal failure recently.  [place] 2/16 showed elevated R>L heart filling pressures with low PAPI at 1.7 and preserved cardiac output. V/Q scan not suggestive of chronic PEs.  Decision made to admit for diuresis and monitoring of renal function. Repeat echo in hospital showed EF 55-60%, moderate LVH, D-shaped interventricular septum, RV moderately dilated with low normal function, severe biatrial enlargement, severe TR => confirming prominent RV failure.  Breathing significantly better since admission  - 2/28 TDC placed and also had first HD session - volume status stable - Continue Bidil 1/2 tab tid.  - Continue coreg 3.125mg  2. Atrial flutter/fibrillation: History of atypical flutter and atrial fibrillation.Lately has been in NSR on amiodarone 100 mg daily. She is off apixaban with recurrent severe GI bleeding.  - Continue amiodarone.  - Has been referred to Dr. Quentin Ore for evaluation for Watchman device. 3. CKD stage V: Followed by nephrology as outpatient. Creatinine worsened during admission - now ESRD, s/p 2 consecutive dialysis sessions tolerated these well, no hypotension. Going for a third today - Nephrology following.  - Awaiting CLIP  4. Anemia: Recurrent severe GI bleeding. She is now off Eliquis. Transferrin  saturation 5% at admission.  - Received Feraheme 2/16  - As above, Watchman evaluation eventually. 5. Hypertension:stable/ improved w/ Bidil dose reduction     - continue on reduced Bidil 1/2 tab tid, carvedilol 3.125mg  BID  6. Cirrhosis -was not able to order elastography inpatient but US shows stable, compensated cirrhosis with normal doppler studies with no ascites 7. Hyponatremia -improved with dialysis 124-->132-->129  Length of Stay: [place], MD  07/07/2020, 9:25 AM  Advanced Heart Failure Team Pager [phone] (M-F; 7a - 4p)  Please contact [facility] for night-coverage after hours (4p -7a ) and weekends on [email or web address]  Patient seen with Dr. Shan Levans, agree with the above note.   She is tolerating HD so far, getting 2nd run today.  Denies dyspnea.  BP stable.  Continue current meds.    Loralie Champagne 07/07/2020 11:48 AM

## 2020-07-07 NOTE — TOC Progression Note (Signed)
Transition of Care [facility]) - Progression Note    Patient Details  Name: Morgan Clay MRN: [id number] Date of Birth: 1945/10/14  [facility] CM/SW Contact  Zenon Mayo, RN Phone Number: 07/07/2020, 3:38 PM  Clinical Narrative:    NCM received referral for outpatient palliative services, which was given to patient by Palliative services with [facility].  NCM contacted daughter in the room to confirm  That they wanted [facility] she states yes.  NCM informed Chrislynn to contact daughter [name] at [phone] regarding outpatient palliative services.    Expected Discharge Plan: Home/Self Care Barriers to Discharge: Continued Medical Work up  Expected Discharge Plan and Services Expected Discharge Plan: Home/Self Care   Discharge Planning Services: CM Consult Post Acute Care Choice: NA Living arrangements for the past 2 months: Single Family Home                   DME Agency: NA       HH Arranged: NA           Social Determinants of Health (SDOH) Interventions Food Insecurity Interventions: Intervention Not Indicated Financial Strain Interventions: Intervention Not Indicated Housing Interventions: Intervention Not Indicated Transportation Interventions: Intervention Not Indicated Depression Interventions/Treatment : Patient refuses Treatment (Patient reports not having enough money)  Readmission Risk Interventions Readmission Risk Prevention Plan 06/29/2020  Transportation Screening Complete  PCP or Specialist Appt within 3-5 Days (No Data)  [place] or Home Care Consult Complete  Social Work Consult for [place] Planning/Counseling Complete  Palliative Care Screening Not Applicable  Medication Review [employer]) Complete  Some recent data might be hidden

## 2020-07-08 DIAGNOSIS — I5033 Acute on chronic diastolic (congestive) heart failure: Secondary | ICD-10-CM | POA: Diagnosis not present

## 2020-07-08 LAB — RENAL FUNCTION PANEL
Albumin: 3.7 g/dL (ref 3.5–5.0)
Anion gap: 12 (ref 5–15)
BUN: 41 mg/dL — ABNORMAL HIGH (ref 8–23)
CO2: 25 mmol/L (ref 22–32)
Calcium: 9.6 mg/dL (ref 8.9–10.3)
Chloride: 96 mmol/L — ABNORMAL LOW (ref 98–111)
Creatinine, Ser: 3.24 mg/dL — ABNORMAL HIGH (ref 0.44–1.00)
GFR, Estimated: 15 mL/min — ABNORMAL LOW (ref >60)
Glucose, Bld: 96 mg/dL (ref 70–99)
Phosphorus: 4.1 mg/dL (ref 2.5–4.6)
Potassium: 4.2 mmol/L (ref 3.5–5.1)
Sodium: 133 mmol/L — ABNORMAL LOW (ref 135–145)

## 2020-07-08 LAB — CBC
HCT: 35.9 % — ABNORMAL LOW (ref 36.0–46.0)
Hemoglobin: 11.6 g/dL — ABNORMAL LOW (ref 12.0–15.0)
MCH: 28.8 pg (ref 26.0–34.0)
MCHC: 32.3 g/dL (ref 30.0–36.0)
MCV: 89.1 fL (ref 80.0–100.0)
Platelets: 155 10*3/uL (ref 150–400)
RBC: 4.03 MIL/uL (ref 3.87–5.11)
RDW: 20.5 % — ABNORMAL HIGH (ref 11.5–15.5)
WBC: 6 10*3/uL (ref 4.0–10.5)
nRBC: 0 % (ref 0.0–0.2)

## 2020-07-08 MED ORDER — HEPARIN SODIUM (PORCINE) 1000 UNIT/ML DIALYSIS: 20.0000 [IU]/kg | INTRAMUSCULAR | Status: DC | PRN

## 2020-07-08 MED ORDER — KIDNEY FAILURE BOOK
Freq: Once | Status: AC
  Filled 2020-07-08: qty 1

## 2020-07-08 NOTE — Progress Notes (Addendum)
Patient ID: Morgan Clay, female   DOB: November 01, 1945, 75 y.o.   MRN: [id number] S: No new complaints O:BP (!) 151/63 (BP Location: Left Arm)   Pulse 60   Temp 98.4 F (36.9 C) (Oral)   Resp 20   Ht 4\' 11"  (1.499 m)   Wt 58 kg   SpO2 100%   BMI 25.83 kg/m   Intake/Output Summary (Last 24 hours) at 07/08/2020 0850 Last data filed at 07/08/2020 0263 Gross per 24 hour  Intake -  Output 1160 ml  Net -1160 ml   Intake/Output: I/O last 3 completed shifts: In: -  Out: 1200 [Urine:200; Other:1000]  Intake/Output this shift:  Total I/O In: -  Out: 160 [Urine:160] Weight change: -0.3 kg Gen: NAD CVS: RRR Resp: CTA Abd: benign Ext: no edema  Recent Labs  Lab 07/02/20 0309 07/03/20 0242 07/04/20 0401 07/05/20 0254 07/06/20 0400 07/07/20 0409 07/08/20 0345  NA 126* 125* 124* 124* 132* 129* 133*  K 4.1 4.4 4.0 4.4 3.5 4.3 4.2  CL 87* 85* 83* 84* 93* 93* 96*  CO2 26 25 26 26 25 24 25   GLUCOSE 105* 105* 117* 108* 112* 102* 96  BUN 116* 140* 138* 140* 64* 74* 41*  CREATININE 4.35* 4.52* 4.21* 4.13* 2.42* 2.98* 3.24*  ALBUMIN 3.8 4.0 4.1 4.0 4.0 3.9 3.7  CALCIUM 9.9 10.2 10.3 10.1 9.7 9.6 9.6  PHOS 3.5 2.7 3.8 4.7* 2.1* 3.9 4.1   Liver Function Tests: Recent Labs  Lab 07/06/20 0400 07/07/20 0409 07/08/20 0345  ALBUMIN 4.0 3.9 3.7   No results for input(s): LIPASE, AMYLASE in the last 168 hours. No results for input(s): AMMONIA in the last 168 hours. CBC: Recent Labs  Lab 07/04/20 0401 07/05/20 0254 07/06/20 0400 07/07/20 0956 07/08/20 0825  WBC 7.1 7.0 5.9 6.3 6.0  HGB 11.3* 11.1* 11.9* 11.1* 11.6*  HCT 34.1* 34.2* 35.4* 35.6* 35.9*  MCV 84.2 84.9 85.1 89.2 89.1  PLT 262 270 257 221 155   Cardiac Enzymes: No results for input(s): CKTOTAL, CKMB, CKMBINDEX, TROPONINI in the last 168 hours. CBG: No results for input(s): GLUCAP in the last 168 hours.  Iron Studies: No results for input(s): IRON, TIBC, TRANSFERRIN, FERRITIN in the last 72  hours. Studies/Results: No results found. [name] allopurinol  100 mg Oral Daily  . amiodarone  100 mg Oral Daily  . carvedilol  3.125 mg Oral BID WC  . Chlorhexidine Gluconate Cloth  6 each Topical Q0600  . gabapentin  300 mg Oral Daily  . heparin  5,000 Units Subcutaneous Q8H  . isosorbide-hydrALAZINE  0.5 tablet Oral TID  . levothyroxine  137 mcg Oral Q0600  . pantoprazole  40 mg Oral Daily  . sodium chloride flush  3 mL Intravenous Q12H    BMET    Component Value Date/Time   NA 133 (L) 07/08/2020 0345   NA 140 12/25/2018 1514   K 4.2 07/08/2020 0345   CL 96 (L) 07/08/2020 0345   CO2 25 07/08/2020 0345   GLUCOSE 96 07/08/2020 0345   BUN 41 (H) 07/08/2020 0345   BUN 33 (H) 12/25/2018 1514   CREATININE 3.24 (H) 07/08/2020 0345   CREATININE 2.24 (H) 02/04/2020 1416   CALCIUM 9.6 07/08/2020 0345   GFRNONAA 15 (L) 07/08/2020 0345   GFRNONAA 21 (L) 02/04/2020 1416   GFRAA 25 (L) 02/04/2020 1416   CBC    Component Value Date/Time   WBC 6.0 07/08/2020 0825   RBC 4.03 07/08/2020 0825   HGB 11.6 (  L) 07/08/2020 0825   HGB 11.0 (L) 06/19/2019 1010   HGB 8.4 (L) 10/04/2017 1534   HCT 35.9 (L) 07/08/2020 0825   HCT 26.1 (L) 10/04/2017 1534   PLT 155 07/08/2020 0825   PLT 192 06/19/2019 1010   PLT 201 10/04/2017 1534   MCV 89.1 07/08/2020 0825   MCV 87 10/04/2017 1534   MCH 28.8 07/08/2020 0825   MCHC 32.3 07/08/2020 0825   RDW 20.5 (H) 07/08/2020 0825   RDW 17.3 (H) 10/04/2017 1534   LYMPHSABS 0.7 06/23/2020 1224   LYMPHSABS 2.1 10/04/2017 1534   MONOABS 0.8 06/23/2020 1224   EOSABS 0.1 06/23/2020 1224   EOSABS 0.2 10/04/2017 1534   BASOSABS 0.0 06/23/2020 1224   BASOSABS 0.0 10/04/2017 1534     Assessment/Plan:  1. AKI/CKD stage IV - in setting of ABLA and diastolic CHF (prominent RV dysfunction) without significant recovery. Mainly cardiorenal physiology with worsening BUN/CR with diuresis. Planning for first HD today but not clear if she will tolerate it given  biventricular failure. Discussed with Morgan Clay and she is willing to try HD. 1. S/p RIJ TDC placed2/28/22by Dr. Vernard Gambles 2. First HD session 07/06/20 and tolerated it well and had second today. 3. Seen on HD for her 3rd session today.    4. Try to avoid hypotension with HD and UF as tolerated 5. Will follow UOP and plan for HD on Sat.   6. Consult VVS for AVF/AVG placement. 7. Renal navigator to assist with outpatient HD arrangements.  2. Chronic combined systolic and diastolic CHF with significant RV dysfunction. Heart Failure team following.  3. Atrial flutter/fibrillation - rate controlled on amiodarone. Off anticoagulation due to recurrent and severe GI bleeding. Possible Watchman device placement. 4. Anemia of CKD and chronic GI blood loss 5. HTN - stable 6. Cirrhosis - stable, no ascites 7. Hyponatremia - improved with HD. 8. Disposition- pt wishes to be full code and continue with IHD.  Appreciate Palliative care consult.  Will need outpatient HD to be arranged as she has not shown any signs of renal recovery.  Will discuss with renal navigator.   Donetta Potts, MD [name] [phone]

## 2020-07-08 NOTE — Progress Notes (Addendum)
Rounded on patient today in correlation to transition to starting outpatient outpatient HD secondary to AKI. Patient is pleasant and willing to listen. Ordered consult to dietician and Kidney Failure book.  Patient educated at the bedside regarding care of tunneled dialysis catheter, and proper medication administration on HD days.  Patient reports that she still feels pain at the insertion site of HD catherter. Educated to continue to move her arm and neck. No noted swelling or bruising at site. Patient also educated on the importance of adhering to scheduled dialysis treatments, the effects of fluid overload, hyperkalemia and hyperphosphatemia. Patient capable of re-verbalizing via teach back method.   Also educated patient on services available through the interdisciplinary team in the clinic setting and the differences they will note when transitioning to the outpatient setting from the hospital. Patient's daughter entered near the end of session. This nurse reitered her fluid restrictions to her daughter and patient. Dietician to round on patient tomorrow.  Patient with no further questions at this time. Handouts and contact information provided to patient for any further assistance. Will follow as appropriate.   This RN also spoke with the charge RN secondary to patient's request for a pain cream to assist with the pain to her neck. Patient reports that neither her tramadol or tylenol are working.Dorthey Sawyer, RN  Dialysis Nurse Coordinator Phone: [phone]

## 2020-07-08 NOTE — Progress Notes (Addendum)
Patient ID: Morgan Clay, female   DOB: 12-Jun-1945, 75 y.o.   MRN: [id number]     Advanced Heart Failure Rounding Note  PCP-Cardiologist: No primary care provider on file.   Subjective:    -2/28 TDC placement and first dialysis session  Saw patient in dialysis this am.  Continues to have pain at [facility] site with mild improvement.  Still making urine but not much, 1.2L UF removed yesterday, weight stable.   Tolerating dialysis well no hypotension.  She denies chest pain, dyspnea or PND.     Echo: EF 55-60%, moderate LVH, D-shaped interventricular septum, RV moderately dilated with low normal function, severe biatrial enlargement, severe TR.    RHC Procedural Findings (2/17): Hemodynamics (mmHg) RA mean 21 RV 49/20 PA 59/24, mean 39 PCWP mean 26 Oxygen saturations: PA 64% AO 98% Cardiac Output (Fick) 5.07  Cardiac Index (Fick) 3.23 PVR 2.6 WU Cardiac Output (Thermo) 4.72  Cardiac Index (Thermo) 3.01  PVR 2.75 WU PAPI 1.7  Objective:   Weight Range: 58 kg Body mass index is 25.83 kg/m.   Vital Signs:   Temp:  [97.4 F (36.3 C)-98.5 F (36.9 C)] 98.4 F (36.9 C) (03/03 0730) Pulse Rate:  [60-61] 60 (03/03 0426) Resp:  [12-20] 20 (03/03 0800) BP: (118-209)/(51-83) 151/63 (03/03 0800) SpO2:  [98 %-100 %] 100 % (03/03 0730) Weight:  [57 kg-58.2 kg] 58 kg (03/03 0730) Last BM Date: 07/06/20  Weight change: Filed Weights   07/07/20 1207 07/08/20 0433 07/08/20 0730  Weight: 57 kg 58.1 kg 58 kg    Intake/Output:   Intake/Output Summary (Last 24 hours) at 07/08/2020 0909 Last data filed at 07/08/2020 8588 Gross per 24 hour  Intake -  Output 1160 ml  Net -1160 ml      Physical Exam  Cardiac: normal rate and rhythm, clear s1 and s2, 2/6 TR, no rubs or gallops, no LE edema Pulmonary: CTAB, not in distress Abdominal:  soft and mild tenderness on the right upper quadrant Psych: Alert, conversant, in good spirits Skin: TDC site clean dry and intact   Telemetry    A-V paced 60s   EKG    No new EKG to review today   Labs    CBC Recent Labs    07/07/20 0956 07/08/20 0825  WBC 6.3 6.0  HGB 11.1* 11.6*  HCT 35.6* 35.9*  MCV 89.2 89.1  PLT 221 502   Basic Metabolic Panel Recent Labs    07/07/20 0409 07/08/20 0345  NA 129* 133*  K 4.3 4.2  CL 93* 96*  CO2 24 25  GLUCOSE 102* 96  BUN 74* 41*  CREATININE 2.98* 3.24*  CALCIUM 9.6 9.6  PHOS 3.9 4.1   Liver Function Tests Recent Labs    07/07/20 0409 07/08/20 0345  ALBUMIN 3.9 3.7   No results for input(s): LIPASE, AMYLASE in the last 72 hours. Cardiac Enzymes No results for input(s): CKTOTAL, CKMB, CKMBINDEX, TROPONINI in the last 72 hours.  BNP: BNP (last 3 results) Recent Labs    06/18/20 1300 06/21/20 1501 06/23/20 1224  BNP 1,147.8* 981.7* 1,099.2*    ProBNP (last 3 results) Recent Labs    07/17/19 1352 07/23/19 1033  PROBNP 811.0* 637.0*     D-Dimer No results for input(s): DDIMER in the last 72 hours. Hemoglobin A1C No results for input(s): HGBA1C in the last 72 hours. Fasting Lipid Panel No results for input(s): CHOL, HDL, LDLCALC, TRIG, CHOLHDL, LDLDIRECT in the last 72 hours. Thyroid Function Tests No results  for input(s): TSH, T4TOTAL, T3FREE, THYROIDAB in the last 72 hours.  Invalid input(s): FREET3  Other results:   Imaging    No results found.   Medications:     Scheduled Medications: . allopurinol  100 mg Oral Daily  . amiodarone  100 mg Oral Daily  . carvedilol  3.125 mg Oral BID WC  . Chlorhexidine Gluconate Cloth  6 each Topical Q0600  . gabapentin  300 mg Oral Daily  . heparin  5,000 Units Subcutaneous Q8H  . isosorbide-hydrALAZINE  0.5 tablet Oral TID  . levothyroxine  137 mcg Oral Q0600  . pantoprazole  40 mg Oral Daily  . sodium chloride flush  3 mL Intravenous Q12H    Infusions: . sodium chloride    . sodium chloride      PRN Medications: sodium chloride, sodium chloride, acetaminophen, heparin,  ondansetron (ZOFRAN) IV, oxyCODONE-acetaminophen, sodium chloride flush, traMADol, traZODone    Assessment/Plan   1. Chronic primarily diastolic CHF with prominent RV dysfunction: Patient has history of presumed nonischemic cardiomyopathy, EF as low at 25-30% in 12/15.  Medtronic CRT-D device placed with improvement in LV function, echo in 4/19 showed EF improved to 60-65%. PYP scan in 3/20 was equivocal for transthyretin amyloidosis, PYP scan in 1/22 was not suggestive of transthyretin amyloidosis. LHC/RHC in 8/21 showed nonobstructive CAD, normal filling pressures and cardiac output. Echo 8/21 showed EF 50-55%, mildly decreased RV systolic function, moderate AI. NYHA class IIIb symptoms with progressive volume overload and renal failure recently.  [facility] 2/16 showed elevated R>L heart filling pressures with low PAPI at 1.7 and preserved cardiac output. V/Q scan not suggestive of chronic PEs.  Decision made to admit for diuresis and monitoring of renal function. Repeat echo in hospital showed EF 55-60%, moderate LVH, D-shaped interventricular septum, RV moderately dilated with low normal function, severe biatrial enlargement, severe TR => confirming prominent RV failure.  Breathing significantly better since admission  - 2/28 TDC placed and also had first HD session - volume status stable - Continue Bidil 1/2 tab tid.  - Continue coreg 3.125mg  2. Atrial flutter/fibrillation: History of atypical flutter and atrial fibrillation.Lately has been in NSR on amiodarone 100 mg daily. She is off apixaban with recurrent severe GI bleeding.  - Continue amiodarone.  - Has been referred to Dr. Quentin Ore for evaluation for Watchman device. 3. CKD stage V: Followed by nephrology as outpatient. Creatinine worsened during admission - now ESRD, she is completing her third consecutive dialysis session and so far has tolerated these well, no hypotension.  -still making some urine but not much - Nephrology following.   - Awaiting CLIP  4. Anemia: Recurrent severe GI bleeding. She is now off Eliquis. Transferrin saturation 5% at admission.  - Received Feraheme 2/16  - As above, Watchman evaluation eventually. 5. Hypertension:stable/ improved w/ Bidil dose reduction     - continue on reduced Bidil 1/2 tab tid, carvedilol 3.125mg  BID  6. Cirrhosis -was not able to order elastography inpatient but US shows stable, compensated cirrhosis with normal doppler studies with no ascites 7. Hyponatremia -improved with dialysis now 133  Length of Stay: [place], [place]  07/08/2020, 9:09 AM  Advanced Heart Failure Team Pager [phone] (M-F; 7a - 4p)  Please contact [facility] for night-coverage after hours (4p -7a ) and weekends on [email or web address]  Agree with Dr. Maudie Flakes note.   Getting HD #3 today. BP seems to tolerate HD.    Will need planning for outpatient HD.  Will coordinate timing of discharge with nephrology.   Loralie Champagne 07/08/2020 11:14 AM

## 2020-07-08 NOTE — Progress Notes (Signed)
Renal Navigator met with patient at bedside to discuss outpatient HD referral for AKI treatment per request of Dr. Marval Regal. Patient understanding and agreeable. She lives in [place] and states her transportation will "depend on what time dialysis is." She states she is agreeable to going to [facility] or [facility] clinic and that she wants to remain with [name], as she followed with a Nephrologist in this office previously.  Navigator completed referral to [facility] Admissions and will follow up when patient has been accepted and seat is secured.   Alphonzo Cruise, [place] Renal Navigator [phone]

## 2020-07-09 DIAGNOSIS — E440 Moderate protein-calorie malnutrition: Secondary | ICD-10-CM | POA: Insufficient documentation

## 2020-07-09 DIAGNOSIS — I5033 Acute on chronic diastolic (congestive) heart failure: Secondary | ICD-10-CM | POA: Diagnosis not present

## 2020-07-09 HISTORY — DX: Moderate protein-calorie malnutrition: E44.0

## 2020-07-09 LAB — RENAL FUNCTION PANEL
Albumin: 3.9 g/dL (ref 3.5–5.0)
Anion gap: 11 (ref 5–15)
BUN: 23 mg/dL (ref 8–23)
CO2: 26 mmol/L (ref 22–32)
Calcium: 10.1 mg/dL (ref 8.9–10.3)
Chloride: 96 mmol/L — ABNORMAL LOW (ref 98–111)
Creatinine, Ser: 3.45 mg/dL — ABNORMAL HIGH (ref 0.44–1.00)
GFR, Estimated: 14 mL/min — ABNORMAL LOW (ref >60)
Glucose, Bld: 103 mg/dL — ABNORMAL HIGH (ref 70–99)
Phosphorus: 3.5 mg/dL (ref 2.5–4.6)
Potassium: 4.3 mmol/L (ref 3.5–5.1)
Sodium: 133 mmol/L — ABNORMAL LOW (ref 135–145)

## 2020-07-09 MED ORDER — DICLOFENAC SODIUM 1 % EX GEL
4.0000 g | Freq: Four times a day (QID) | CUTANEOUS | Status: DC
  Administered 2020-07-09 – 2020-07-12 (×11): 4 g via TOPICAL
  Filled 2020-07-09: qty 100

## 2020-07-09 MED ORDER — RENA-VITE PO TABS
1.0000 | ORAL_TABLET | Freq: Every day | ORAL | Status: DC
  Administered 2020-07-09 – 2020-07-11 (×3): 1 via ORAL
  Filled 2020-07-09 (×3): qty 1

## 2020-07-09 MED ORDER — ACETAMINOPHEN 500 MG PO TABS
500.0000 mg | ORAL_TABLET | Freq: Four times a day (QID) | ORAL | Status: DC
  Administered 2020-07-09 – 2020-07-12 (×12): 500 mg via ORAL
  Filled 2020-07-09 (×13): qty 1

## 2020-07-09 MED ORDER — HYDROMORPHONE HCL 2 MG PO TABS
2.0000 mg | ORAL_TABLET | Freq: Four times a day (QID) | ORAL | Status: DC | PRN
  Administered 2020-07-09 – 2020-07-12 (×10): 2 mg via ORAL
  Filled 2020-07-09 (×10): qty 1

## 2020-07-09 MED ORDER — NEPRO/CARBSTEADY PO LIQD: 237.0000 mL | Freq: Two times a day (BID) | ORAL | Status: DC

## 2020-07-09 MED ORDER — ENSURE ENLIVE PO LIQD
237.0000 mL | Freq: Two times a day (BID) | ORAL | Status: DC
  Administered 2020-07-10: 237 mL via ORAL

## 2020-07-09 NOTE — Progress Notes (Signed)
Patient did not want initial outpatient seat assignment at [facility] because it was too early. Navigator was able to get her accepted at [facility] on a MWF schedule with a seat time of 11:40am. She needs to arrive to her appointments 20 minutes early. However, this seat is not available until Wednesday, 07/14/20. Navigator was in the patient's room when Attending/Dr. [name] came in to inform patient that he is ready to discharge her when Renal team is. Navigator spoke with Dr. Marval Regal who agrees to make referral to [facility] as an outpatient, so Navigator contacted [facility] clinic and has arranged for patient to start on Monday, 07/12/20 in an 11:00am seat. This will not be her permanent seat time, but will allow her to start on Monday. She needs to arrive at 10:00am on Monday in order to sign intake paperwork.  Navigator met with patient, who has daughter on speaker phone to discuss plan. She will need HD here tomorrow and then can discharge per Attending and Nephrologist.  Navigator informed [facility] Admissions of plan and asked Renal PA/D. Zeyfang to send orders to clinic.  Alphonzo Cruise, [place] Renal Navigator [phone]

## 2020-07-09 NOTE — Care Management Important Message (Signed)
Important Message  Patient Details  Name: Colette Dicamillo MRN: [id number] Date of Birth: 04/24/1946   Medicare Important Message Given:  Yes     Shelda Altes 07/09/2020, 10:11 AM

## 2020-07-09 NOTE — Progress Notes (Signed)
CARDIAC REHAB PHASE I   PRE:  Rate/Rhythm: 64 pacing    BP: sitting 141/84    SaO2:   MODE:  Ambulation: 470 ft   POST:  Rate/Rhythm: 68 pacing    BP: sitting 140/103     SaO2:   Pt with significant upper chest/arm pain at HD catheter site. Willing to walk but difficult to move independently. Mod assist getting in and out of bed. Min assist in hall. No other c/o. Return to bed. Van, ACSM 07/09/2020 3:23 PM

## 2020-07-09 NOTE — Progress Notes (Addendum)
Initial Nutrition Assessment  DOCUMENTATION CODES:   Non-severe (moderate) malnutrition in context of chronic illness  INTERVENTION:   -Snacks BID  -Nepro with Carb Steady (vanilla) BID, each supplement provides 420 kcal, 19 grams protein  -Rena-vit po daily  -Provided pt with Renal Diet Education  NUTRITION DIAGNOSIS:   Moderate Malnutrition related to chronic illness (ESRD on HD, CHF, cirrhosis) as evidenced by energy intake < 75% for > or equal to 3 months,percent weight loss.  GOAL:   Patient will meet greater than or equal to 90% of their needs  MONITOR:   PO intake,Supplement acceptance,Skin,Labs,Weight trends  REASON FOR ASSESSMENT:   Consult Diet education  ASSESSMENT:   10 YOF admitted for acute onset chronic/diastolic CHF. PMH of CHF, LBB, nonischemic cardiomyopathy, HLD, HTN, diverticulosis, A. Fib s/p AICD, hep C, DM w/o comp, ESRD, cirrhosis, angiodysplasia of SI (06/10/20), recent COVID+ (05/03/20) and recent hospitalization for gastric bleeding from gastric ulcer and noted multiple small bowel AVMs.  2/16 - R heart cath  2/28 - tunneled catheter 3/01 - 1st HD 3/02 - 2nd HD 3/03 - 3rd HD  Per chart meal documentation, pt has been consuming 25-100% of meals. Pt reports that she has had a poor appetite for the past 4-6 months which she attributed to feeling sick. Her intake since she's had a poor appetite has typically consisted of breakfast and 2 Ensure Enlives per day. Pt reports that her breakfast typically consists of bacon, eggs and grits or cereal and milk or sometimes just toast and coffee. Pt reports that when she did have a good appetite, she typically consumed 3 meals a day and snacks which consisted of:  Breakfast - bacon, eggs, grits or cereal and milk  Lunch - 1/2 of a ham sandwich or peanut butter sandwich with chips  Dinner - baked fish or chicken with vegetables and a starch  Snacks - a few chips or pretzels at night or peanuts Pt reports that  since she's been admitted she has been feeling hungry, however has not been eating well due to the food she's receiving not aligning with her preferences. Intern discussed with pt the importance of getting enough protein and calories. Pt was open to having snacks as well as Nepro shakes. Intern provided renal diet education to pt using All About Protein, Low/High Potassium Fruits/Vegetables, Sodium and CKD Diet, Dietary Guidelines for Adults Starting on HD handouts. Diet education note to follow.   Per chart, pt's weights have decreased since admission. Intern noted edema in bilateral lower extremities as well which could be masking true weight.  Admit weight: 136# Current weight: 127.21# Pt reports a UBW of 140-150#, however reports that she hasn't weighed this in a few weeks due to her weight loss from feeling bad and her decreased appetite. 14% weight loss noted since admission which is significant. Pt demonstrates clinical characteristics for at least moderate malnutrition, however potential for severe malnutrition will continue to assess.  Pt reports that she is mobile at home, however does occasionally use a walker.   Meds reviewed.  Labs reviewed: Sodium (133, low), Corrected Calcium (10.18)  NUTRITION - FOCUSED PHYSICAL EXAM:  Flowsheet Row Most Recent Value  Orbital Region No depletion  Upper Arm Region No depletion  Thoracic and Lumbar Region No depletion  Buccal Region No depletion  Temple Region Mild depletion  Clavicle Bone Region No depletion  Clavicle and Acromion Bone Region No depletion  Scapular Bone Region Unable to assess  Dorsal Hand No depletion  Patellar Region Mild depletion  Anterior Thigh Region Mild depletion  Posterior Calf Region Mild depletion  Edema (RD Assessment) Mild  [bilateral lower extremities]  Hair Reviewed  Eyes Reviewed  Mouth Reviewed  Skin Reviewed  Nails Reviewed       Diet Order:   Diet Order            Diet 2 gram sodium Room  service appropriate? Yes; Fluid consistency: Thin; Fluid restriction: 1200 mL Fluid  Diet effective now                 EDUCATION NEEDS:   Education needs have been addressed (renal diet education)  Skin:  Skin Assessment: Reviewed RN Assessment  Last BM:  3/01  Height:   Ht Readings from Last 1 Encounters:  06/23/20 4\' 11"  (1.499 m)    Weight:   Wt Readings from Last 1 Encounters:  07/09/20 57.7 kg    Ideal Body Weight:  44.7 kg  BMI:  Body mass index is 25.69 kg/m.  Estimated Nutritional Needs:   Kcal:  1800-2000  Protein:  85-100 g  Fluid:  1.2 L    Salvadore Oxford, Dietetic Intern 07/09/2020 3:08 PM

## 2020-07-09 NOTE — Progress Notes (Signed)
Renal Diet Education Note   RD consulted for Renal Education. Provided All About Protein, Low/High Potassium Fruits/Vegetables, Sodium and CKD Diet, Dietary Guidelines for Adults Starting on HD handouts to patient. Reviewed food groups and serving sizes for patient's current nutritional status.   Explained why diet restrictions are needed and provided lists of foods to limit/avoid that are high in potassium, sodium, and phosphorus. Provided specific recommendations on safer alternatives of these foods. Strongly encouraged compliance of this diet.   Discussed importance of protein intake at each meal and snack. Provided examples of how to maximize protein intake throughout the day. Discussed need for fluid restriction with dialysis, importance of minimizing weight gain between HD treatments, and renal-friendly beverage options.   Encouraged pt to discuss specific diet questions/concerns with RD at HD outpatient facility. Teach back method used.   Expect good compliance.   Current diet order is 2 gram sodium and patient is consuming approximately 25-100% of meals at this time. Labs and medications reviewed. No further nutrition interventions warranted at this time. If additional nutrition issues arise, please re-consult RD.   Salvadore Oxford, Dietetic Intern 07/09/2020 12:56 PM

## 2020-07-09 NOTE — Progress Notes (Addendum)
Patient ID: Morgan Clay, female   DOB: Nov 06, 1945, 75 y.o.   MRN: [id number] S: Feels well, no new complaints O:BP (!) 123/56 (BP Location: Right Arm)   Pulse 67   Temp 98.4 F (36.9 C)   Resp 16   Ht 4\' 11"  (1.499 m)   Wt 57.7 kg   SpO2 97%   BMI 25.69 kg/m   Intake/Output Summary (Last 24 hours) at 07/09/2020 1156 Last data filed at 07/09/2020 1000 Gross per 24 hour  Intake 800 ml  Output --  Net 800 ml   Intake/Output: I/O last 3 completed shifts: In: 480 [P.O.:480] Out: 1160 [Urine:160; Other:1000]  Intake/Output this shift:  Total I/O In: 320 [P.O.:320] Out: -  Weight change: -0.2 kg Gen: NAD CVS: RRR Resp: CTA Abd: +BS, soft, NT/ND Ext: no edema  Recent Labs  Lab 07/03/20 0242 07/04/20 0401 07/05/20 0254 07/06/20 0400 07/07/20 0409 07/08/20 0345 07/09/20 0359  NA 125* 124* 124* 132* 129* 133* 133*  K 4.4 4.0 4.4 3.5 4.3 4.2 4.3  CL 85* 83* 84* 93* 93* 96* 96*  CO2 25 26 26 25 24 25 26   GLUCOSE 105* 117* 108* 112* 102* 96 103*  BUN 140* 138* 140* 64* 74* 41* 23  CREATININE 4.52* 4.21* 4.13* 2.42* 2.98* 3.24* 3.45*  ALBUMIN 4.0 4.1 4.0 4.0 3.9 3.7 3.9  CALCIUM 10.2 10.3 10.1 9.7 9.6 9.6 10.1  PHOS 2.7 3.8 4.7* 2.1* 3.9 4.1 3.5   Liver Function Tests: Recent Labs  Lab 07/07/20 0409 07/08/20 0345 07/09/20 0359  ALBUMIN 3.9 3.7 3.9   No results for input(s): LIPASE, AMYLASE in the last 168 hours. No results for input(s): AMMONIA in the last 168 hours. CBC: Recent Labs  Lab 07/04/20 0401 07/05/20 0254 07/06/20 0400 07/07/20 0956 07/08/20 0825  WBC 7.1 7.0 5.9 6.3 6.0  HGB 11.3* 11.1* 11.9* 11.1* 11.6*  HCT 34.1* 34.2* 35.4* 35.6* 35.9*  MCV 84.2 84.9 85.1 89.2 89.1  PLT 262 270 257 221 155   Cardiac Enzymes: No results for input(s): CKTOTAL, CKMB, CKMBINDEX, TROPONINI in the last 168 hours. CBG: No results for input(s): GLUCAP in the last 168 hours.  Iron Studies: No results for input(s): IRON, TIBC, TRANSFERRIN, FERRITIN in the last  72 hours. Studies/Results: No results found. [name] acetaminophen  500 mg Oral Q6H  . allopurinol  100 mg Oral Daily  . amiodarone  100 mg Oral Daily  . carvedilol  3.125 mg Oral BID WC  . Chlorhexidine Gluconate Cloth  6 each Topical Q0600  . diclofenac Sodium  4 g Topical QID  . gabapentin  300 mg Oral Daily  . heparin  5,000 Units Subcutaneous Q8H  . isosorbide-hydrALAZINE  0.5 tablet Oral TID  . levothyroxine  137 mcg Oral Q0600  . pantoprazole  40 mg Oral Daily  . sodium chloride flush  3 mL Intravenous Q12H    BMET    Component Value Date/Time   NA 133 (L) 07/09/2020 0359   NA 140 12/25/2018 1514   K 4.3 07/09/2020 0359   CL 96 (L) 07/09/2020 0359   CO2 26 07/09/2020 0359   GLUCOSE 103 (H) 07/09/2020 0359   BUN 23 07/09/2020 0359   BUN 33 (H) 12/25/2018 1514   CREATININE 3.45 (H) 07/09/2020 0359   CREATININE 2.24 (H) 02/04/2020 1416   CALCIUM 10.1 07/09/2020 0359   GFRNONAA 14 (L) 07/09/2020 0359   GFRNONAA 21 (L) 02/04/2020 1416   GFRAA 25 (L) 02/04/2020 1416   CBC  Component Value Date/Time   WBC 6.0 07/08/2020 0825   RBC 4.03 07/08/2020 0825   HGB 11.6 (L) 07/08/2020 0825   HGB 11.0 (L) 06/19/2019 1010   HGB 8.4 (L) 10/04/2017 1534   HCT 35.9 (L) 07/08/2020 0825   HCT 26.1 (L) 10/04/2017 1534   PLT 155 07/08/2020 0825   PLT 192 06/19/2019 1010   PLT 201 10/04/2017 1534   MCV 89.1 07/08/2020 0825   MCV 87 10/04/2017 1534   MCH 28.8 07/08/2020 0825   MCHC 32.3 07/08/2020 0825   RDW 20.5 (H) 07/08/2020 0825   RDW 17.3 (H) 10/04/2017 1534   LYMPHSABS 0.7 06/23/2020 1224   LYMPHSABS 2.1 10/04/2017 1534   MONOABS 0.8 06/23/2020 1224   EOSABS 0.1 06/23/2020 1224   EOSABS 0.2 10/04/2017 1534   BASOSABS 0.0 06/23/2020 1224   BASOSABS 0.0 10/04/2017 1534    Assessment/Plan:  1. AKI/CKD stage IV - in setting of ABLA and diastolic CHF (prominent RV dysfunction) without significant recovery. Mainly cardiorenal physiology with worsening BUN/CR with diuresis.  Not sure if she will tolerate HD given biventricular failure. Discussed with Morgan Clay and she is willing to try HD.  She has tolerated  1. S/p [facility] placed2/28/22by Dr. Vernard Gambles 2. First HD session 07/06/20 and tolerated it on 3/2 and 3/3 without issues. 3. Try to avoid hypotension with HD and UF as tolerated 4. Will follow UOP and plan for HD on Sat.   5. Consult [facility] for AVF/AVG placement given CKD stage IV at baseline. 6. Starting to make some urine. 7. Renal navigator to assist with outpatient HD arrangements at [facility] on MWF schedule and can go 07/14/20 or later. 2. Chronic combined systolic and diastolic CHF with significant RV dysfunction. Heart Failure team following.  3. Atrial flutter/fibrillation - rate controlled on amiodarone. Off anticoagulation due to recurrent and severe GI bleeding. Possible Watchman device placement. 4. Anemia of CKD and chronic GI blood loss 5. HTN - stable 6. Cirrhosis - stable, no ascites 7. Hyponatremia - improved with HD. 8. Disposition- pt wishes to be full code and continue with IHD.  Appreciate Palliative care consult.  Will need outpatient HD to be arranged as she has not shown any signs of renal recovery at this time.  Will discuss with renal navigator.    Donetta Potts, MD [name] [phone]

## 2020-07-09 NOTE — Progress Notes (Addendum)
Patient ID: Morgan Clay, female   DOB: 10-Dec-1945, 75 y.o.   MRN: [id number]     Advanced Heart Failure Rounding Note  PCP-Cardiologist: No primary care provider on file.   Subjective:    -2/28 TDC placement and first dialysis session  Significant pain today around [facility] site and also muscles in neck.  She tolerated dialysis well yesterday.  She denies any dyspnea, chest pain.     Echo: EF 55-60%, moderate LVH, D-shaped interventricular septum, RV moderately dilated with low normal function, severe biatrial enlargement, severe TR.    RHC Procedural Findings (2/17): Hemodynamics (mmHg) RA mean 21 RV 49/20 PA 59/24, mean 39 PCWP mean 26 Oxygen saturations: PA 64% AO 98% Cardiac Output (Fick) 5.07  Cardiac Index (Fick) 3.23 PVR 2.6 WU Cardiac Output (Thermo) 4.72  Cardiac Index (Thermo) 3.01  PVR 2.75 WU PAPI 1.7  Objective:   Weight Range: 57.7 kg Body mass index is 25.69 kg/m.   Vital Signs:   Temp:  [97.3 F (36.3 C)-98.2 F (36.8 C)] 98 F (36.7 C) (03/04 0747) Pulse Rate:  [60-72] 66 (03/04 0747) Resp:  [14-19] 18 (03/04 0747) BP: (95-166)/(42-71) 145/58 (03/04 0747) SpO2:  [85 %-100 %] 100 % (03/04 0747) Weight:  [56.5 kg-57.7 kg] 57.7 kg (03/04 0359) Last BM Date: 07/06/20  Weight change: Filed Weights   07/08/20 0730 07/08/20 1045 07/09/20 0359  Weight: 58 kg 56.5 kg 57.7 kg    Intake/Output:   Intake/Output Summary (Last 24 hours) at 07/09/2020 0811 Last data filed at 07/09/2020 0600 Gross per 24 hour  Intake 480 ml  Output 1160 ml  Net -680 ml      Physical Exam  Cardiac: normal rate and rhythm, clear s1 and s2, 2/6 TR, no rubs or gallops, no LE edema Pulmonary: CTAB, not in distress Abdominal:  soft and mild tenderness on the right upper quadrant Psych: Alert, conversant, in good spirits Skin: TDC site clean dry and intact   Telemetry   A-V paced 60s   EKG    No new EKG to review today   Labs    CBC Recent Labs     07/07/20 0956 07/08/20 0825  WBC 6.3 6.0  HGB 11.1* 11.6*  HCT 35.6* 35.9*  MCV 89.2 89.1  PLT 221 761   Basic Metabolic Panel Recent Labs    07/08/20 0345 07/09/20 0359  NA 133* 133*  K 4.2 4.3  CL 96* 96*  CO2 25 26  GLUCOSE 96 103*  BUN 41* 23  CREATININE 3.24* 3.45*  CALCIUM 9.6 10.1  PHOS 4.1 3.5   Liver Function Tests Recent Labs    07/08/20 0345 07/09/20 0359  ALBUMIN 3.7 3.9   No results for input(s): LIPASE, AMYLASE in the last 72 hours. Cardiac Enzymes No results for input(s): CKTOTAL, CKMB, CKMBINDEX, TROPONINI in the last 72 hours.  BNP: BNP (last 3 results) Recent Labs    06/18/20 1300 06/21/20 1501 06/23/20 1224  BNP 1,147.8* 981.7* 1,099.2*    ProBNP (last 3 results) Recent Labs    07/17/19 1352 07/23/19 1033  PROBNP 811.0* 637.0*     D-Dimer No results for input(s): DDIMER in the last 72 hours. Hemoglobin A1C No results for input(s): HGBA1C in the last 72 hours. Fasting Lipid Panel No results for input(s): CHOL, HDL, LDLCALC, TRIG, CHOLHDL, LDLDIRECT in the last 72 hours. Thyroid Function Tests No results for input(s): TSH, T4TOTAL, T3FREE, THYROIDAB in the last 72 hours.  Invalid input(s): FREET3  Other results:  Imaging    No results found.   Medications:     Scheduled Medications: . acetaminophen  500 mg Oral Q6H  . allopurinol  100 mg Oral Daily  . amiodarone  100 mg Oral Daily  . carvedilol  3.125 mg Oral BID WC  . Chlorhexidine Gluconate Cloth  6 each Topical Q0600  . diclofenac Sodium  4 g Topical QID  . gabapentin  300 mg Oral Daily  . heparin  5,000 Units Subcutaneous Q8H  . isosorbide-hydrALAZINE  0.5 tablet Oral TID  . levothyroxine  137 mcg Oral Q0600  . pantoprazole  40 mg Oral Daily  . sodium chloride flush  3 mL Intravenous Q12H    Infusions: . sodium chloride    . sodium chloride      PRN Medications: sodium chloride, sodium chloride, HYDROmorphone, ondansetron (ZOFRAN) IV, sodium  chloride flush, traMADol, traZODone    Assessment/Plan   1. Chronic primarily diastolic CHF with prominent RV dysfunction: Patient has history of presumed nonischemic cardiomyopathy, EF as low at 25-30% in 12/15.  Medtronic CRT-D device placed with improvement in LV function, echo in 4/19 showed EF improved to 60-65%. PYP scan in 3/20 was equivocal for transthyretin amyloidosis, PYP scan in 1/22 was not suggestive of transthyretin amyloidosis. LHC/RHC in 8/21 showed nonobstructive CAD, normal filling pressures and cardiac output. Echo 8/21 showed EF 50-55%, mildly decreased RV systolic function, moderate AI. NYHA class IIIb symptoms with progressive volume overload and renal failure recently.  [place] 2/16 showed elevated R>L heart filling pressures with low PAPI at 1.7 and preserved cardiac output. V/Q scan not suggestive of chronic PEs.  Decision made to admit for diuresis and monitoring of renal function. Repeat echo in hospital showed EF 55-60%, moderate LVH, D-shaped interventricular septum, RV moderately dilated with low normal function, severe biatrial enlargement, severe TR => confirming prominent RV failure.  Breathing significantly better since admission  - 2/28 TDC placed and also had first HD session - volume status stable, 1.2L removed yesterday - Continue Bidil 1/2 tab tid.  - Continue coreg 3.125mg  2. Atrial flutter/fibrillation: History of atypical flutter and atrial fibrillation.Lately has been in NSR on amiodarone 100 mg daily. She is off apixaban with recurrent severe GI bleeding.  - Continue amiodarone.  - Has been referred to Dr. Quentin Ore for evaluation for Watchman device. 3. CKD stage V: Followed by nephrology as outpatient. Creatinine worsened during admission - now ESRD, she has completed her third consecutive dialysis session and so far has tolerated these well, no hypotension.  -1.2L removed yesterday no hypotension - Continues to have significant pain after [facility]  placement adjusting pain regimen  - Nephrology following.  - Awaiting CLIP they are working on locating a facility for [place] near [place] 4. Anemia: Recurrent severe GI bleeding. She is now off Eliquis. Transferrin saturation 5% at admission.  - Received Feraheme 2/16  - As above, Watchman evaluation eventually. 5. Hypertension:stable/ improved w/ Bidil dose reduction     - continue on reduced Bidil 1/2 tab tid, carvedilol 3.125mg  BID  6. Cirrhosis -was not able to order elastography inpatient but US shows stable, compensated cirrhosis with normal doppler studies with no ascites 7. Hyponatremia -improved with dialysis now 133  Length of Stay: King William, MD  07/09/2020, 8:11 AM  Advanced Heart Failure Team Pager [phone] (M-F; [place])  Please contact [facility] for night-coverage after hours (4p -7a ) and weekends on [email or web address]  Patient seen with Dr. Shan Levans, agree with  the above note.   She has tolerated HD so far, will have session 4 Saturday. BP remains stable.  Can go home when ready from nephrology's standpoint.   Loralie Champagne 07/09/2020 1:17 PM

## 2020-07-10 DIAGNOSIS — I5033 Acute on chronic diastolic (congestive) heart failure: Secondary | ICD-10-CM | POA: Diagnosis not present

## 2020-07-10 DIAGNOSIS — N186 End stage renal disease: Secondary | ICD-10-CM

## 2020-07-10 LAB — RENAL FUNCTION PANEL
Albumin: 3.9 g/dL (ref 3.5–5.0)
Anion gap: 10 (ref 5–15)
BUN: 32 mg/dL — ABNORMAL HIGH (ref 8–23)
CO2: 25 mmol/L (ref 22–32)
Calcium: 10 mg/dL (ref 8.9–10.3)
Chloride: 91 mmol/L — ABNORMAL LOW (ref 98–111)
Creatinine, Ser: 4.61 mg/dL — ABNORMAL HIGH (ref 0.44–1.00)
GFR, Estimated: 10 mL/min — ABNORMAL LOW (ref >60)
Glucose, Bld: 86 mg/dL (ref 70–99)
Phosphorus: 4.9 mg/dL — ABNORMAL HIGH (ref 2.5–4.6)
Potassium: 5.4 mmol/L — ABNORMAL HIGH (ref 3.5–5.1)
Sodium: 126 mmol/L — ABNORMAL LOW (ref 135–145)

## 2020-07-10 LAB — CBC
HCT: 35.4 % — ABNORMAL LOW (ref 36.0–46.0)
Hemoglobin: 11.5 g/dL — ABNORMAL LOW (ref 12.0–15.0)
MCH: 29 pg (ref 26.0–34.0)
MCHC: 32.5 g/dL (ref 30.0–36.0)
MCV: 89.4 fL (ref 80.0–100.0)
Platelets: 169 10*3/uL (ref 150–400)
RBC: 3.96 MIL/uL (ref 3.87–5.11)
RDW: 20.6 % — ABNORMAL HIGH (ref 11.5–15.5)
WBC: 7.4 10*3/uL (ref 4.0–10.5)
nRBC: 0 % (ref 0.0–0.2)

## 2020-07-10 MED ORDER — HEPARIN SODIUM (PORCINE) 1000 UNIT/ML DIALYSIS: 20.0000 [IU]/kg | INTRAMUSCULAR | Status: DC | PRN

## 2020-07-10 MED ORDER — GELATIN ABSORBABLE 12-7 MM EX MISC
1.0000 | Freq: Once | CUTANEOUS | Status: AC
  Administered 2020-07-10: 1 via TOPICAL
  Filled 2020-07-10: qty 1

## 2020-07-10 MED ORDER — "THROMBI-PAD 3""X3"" EX PADS"
1.0000 | MEDICATED_PAD | CUTANEOUS | Status: AC
  Administered 2020-07-10: 1 via TOPICAL
  Filled 2020-07-10: qty 1

## 2020-07-10 MED ORDER — THROMBIN FOR PERCUTANEOUS TREATMENT OF PSEUDOANEURYSM (5000UNITS/10ML)
Freq: Once | PERCUTANEOUS | Status: AC
  Filled 2020-07-10 (×3): qty 1

## 2020-07-10 NOTE — Progress Notes (Signed)
Oozing around the new dressing. Pressure applied. Charge RN made aware. Paged IR and made aware.Ordered supplied from pharmacy for Serafina Royals, MD from radiology for the bedside.

## 2020-07-10 NOTE — Progress Notes (Signed)
Patient ID: Morgan Clay, female   DOB: 10-16-1945, 75 y.o.   MRN: [id number] S: Complaining of pain and bleeding from [place] site and unable to have HD due to active bleeding.  Seen at bedside with Dr. Serafina Clay who was placing thrombin and gelfoam. O:BP (!) 168/61 (BP Location: Left Arm)   Pulse 64   Temp 98.4 F (36.9 C) (Oral)   Resp 18   Ht 4\' 11"  (1.499 m)   Wt 57.7 kg   SpO2 100%   BMI 25.69 kg/m   Intake/Output Summary (Last 24 hours) at 07/10/2020 1134 Last data filed at 07/10/2020 0830 Gross per 24 hour  Intake 120 ml  Output -  Net 120 ml   Intake/Output: I/O last 3 completed shifts: In: 800 [P.O.:800] Out: -   Intake/Output this shift:  Total I/O In: 120 [P.O.:120] Out: -  Weight change:  Gen: NAD CVS: RRR Resp: CTA Abd: +BS, soft, NT/ND Ext: no edema  Recent Labs  Lab 07/04/20 0401 07/05/20 0254 07/06/20 0400 07/07/20 0409 07/08/20 0345 07/09/20 0359 07/10/20 0430  NA 124* 124* 132* 129* 133* 133* 126*  K 4.0 4.4 3.5 4.3 4.2 4.3 5.4*  CL 83* 84* 93* 93* 96* 96* 91*  CO2 26 26 25 24 25 26 25   GLUCOSE 117* 108* 112* 102* 96 103* 86  BUN 138* 140* 64* 74* 41* 23 32*  CREATININE 4.21* 4.13* 2.42* 2.98* 3.24* 3.45* 4.61*  ALBUMIN 4.1 4.0 4.0 3.9 3.7 3.9 3.9  CALCIUM 10.3 10.1 9.7 9.6 9.6 10.1 10.0  PHOS 3.8 4.7* 2.1* 3.9 4.1 3.5 4.9*   Liver Function Tests: Recent Labs  Lab 07/08/20 0345 07/09/20 0359 07/10/20 0430  ALBUMIN 3.7 3.9 3.9   No results for input(s): LIPASE, AMYLASE in the last 168 hours. No results for input(s): AMMONIA in the last 168 hours. CBC: Recent Labs  Lab 07/05/20 0254 07/06/20 0400 07/07/20 0956 07/08/20 0825 07/10/20 0430  WBC 7.0 5.9 6.3 6.0 7.4  HGB 11.1* 11.9* 11.1* 11.6* 11.5*  HCT 34.2* 35.4* 35.6* 35.9* 35.4*  MCV 84.9 85.1 89.2 89.1 89.4  PLT 270 257 221 155 169   Cardiac Enzymes: No results for input(s): CKTOTAL, CKMB, CKMBINDEX, TROPONINI in the last 168 hours. CBG: No results for input(s): GLUCAP  in the last 168 hours.  Iron Studies: No results for input(s): IRON, TIBC, TRANSFERRIN, FERRITIN in the last 72 hours. Studies/Results: No results found. [name] acetaminophen  500 mg Oral Q6H  . allopurinol  100 mg Oral Daily  . amiodarone  100 mg Oral Daily  . carvedilol  3.125 mg Oral BID WC  . Chlorhexidine Gluconate Cloth  6 each Topical Q0600  . diclofenac Sodium  4 g Topical QID  . feeding supplement  237 mL Oral BID BM  . gabapentin  300 mg Oral Daily  . gelatin adsorbable  1 each Topical Once  . heparin  5,000 Units Subcutaneous Q8H  . isosorbide-hydrALAZINE  0.5 tablet Oral TID  . levothyroxine  137 mcg Oral Q0600  . multivitamin  1 tablet Oral QHS  . pantoprazole  40 mg Oral Daily  . sodium chloride flush  3 mL Intravenous Q12H  . thrombin 5,000 units for percutaneous treatment of pseudoaneurysm   Percutaneous Once    BMET    Component Value Date/Time   NA 126 (L) 07/10/2020 0430   NA 140 12/25/2018 1514   K 5.4 (H) 07/10/2020 0430   CL 91 (L) 07/10/2020 0430   CO2 25 07/10/2020  0430   GLUCOSE 86 07/10/2020 0430   BUN 32 (H) 07/10/2020 0430   BUN 33 (H) 12/25/2018 1514   CREATININE 4.61 (H) 07/10/2020 0430   CREATININE 2.24 (H) 02/04/2020 1416   CALCIUM 10.0 07/10/2020 0430   GFRNONAA 10 (L) 07/10/2020 0430   GFRNONAA 21 (L) 02/04/2020 1416   GFRAA 25 (L) 02/04/2020 1416   CBC    Component Value Date/Time   WBC 7.4 07/10/2020 0430   RBC 3.96 07/10/2020 0430   HGB 11.5 (L) 07/10/2020 0430   HGB 11.0 (L) 06/19/2019 1010   HGB 8.4 (L) 10/04/2017 1534   HCT 35.4 (L) 07/10/2020 0430   HCT 26.1 (L) 10/04/2017 1534   PLT 169 07/10/2020 0430   PLT 192 06/19/2019 1010   PLT 201 10/04/2017 1534   MCV 89.4 07/10/2020 0430   MCV 87 10/04/2017 1534   MCH 29.0 07/10/2020 0430   MCHC 32.5 07/10/2020 0430   RDW 20.6 (H) 07/10/2020 0430   RDW 17.3 (H) 10/04/2017 1534   LYMPHSABS 0.7 06/23/2020 1224   LYMPHSABS 2.1 10/04/2017 1534   MONOABS 0.8 06/23/2020 1224    EOSABS 0.1 06/23/2020 1224   EOSABS 0.2 10/04/2017 1534   BASOSABS 0.0 06/23/2020 1224   BASOSABS 0.0 10/04/2017 1534     Assessment/Plan:  1. AKI/CKD stage IV - in setting of ABLA and diastolic CHF (prominent RV dysfunction) without significant recovery. Mainly cardiorenal physiology with worsening BUN/CR with diuresis. Not sure if she will tolerate HD given biventricular failure. Discussed with Morgan Clay and she is willing to try HD.  She has tolerated  1. S/p [facility] placed2/28/22by Dr. Vernard Clay 2. First HD session 07/06/20 and tolerated it on 3/2 and 3/3 without issues. 3. Try to avoid hypotension with HD and UF as tolerated 4. Will follow UOP and plan for HD on Sat. 5. Will need outpatient VVS consult for AVF/AVG placement given CKD stage IV at baseline. 6. Plan for HD today now that bleeding HD catheter has been resolved. Appreciate IR assistance.  7. Starting to make some urine but no significant change in renal function.  8. Renal navigator to assist with outpatient HD arrangements at [facility] on MWF schedule and can go 07/12/20 or later. 2. Chronic combined systolic and diastolic CHF with significant RV dysfunction. Heart Failure team following.  3. Atrial flutter/fibrillation - rate controlled on amiodarone. Off anticoagulation due to recurrent and severe GI bleeding. Possible Watchman device placement. 4. Anemia of CKD and chronic GI blood loss 5. HTN - stable 6. Cirrhosis - stable, no ascites 7. Hyponatremia -improvedwith HD. 8. Disposition- pt wishes to be full code and continue with IHD. Appreciate Palliative care consult. Has outpatient appointment on 07/12/20 if stable for discharge this weekend.  If she stays will consult VVS for AVF/AVG placement.     Morgan Potts, MD [name] [phone]

## 2020-07-10 NOTE — Progress Notes (Signed)
Cardiac Rehab 1335 Attempted to get pt to ambulate. She declined stated that she was having a rough day. Pt said that her catheter site bled most of the night and she didn't sleep and that it was painful. She said that the bleeding has stopped but that the site is giving her pain. I encouraged her to walk later. She said that she would walk with her daughter.

## 2020-07-10 NOTE — Progress Notes (Signed)
Called to assess right tunneled HD catheter for bleeding at skin entry site.  Brisk flow exiting skin entry site, soaking bed/clothing.  Hematoma with ecchymosis in mid tunneled track.    No cessation of flow after manual pressure applied.  Administered thrombin-soaked gelfoam along catheter at skin entry site.  Bleeding ceased immediately. Central line dressing + biopatch reapplied.  Please notify IR if bleeding persists.  Bleeding several days after placement with associated hematoma likely from small arteriole along catheter track.  If persistent, would consider removal and new catheter with new tunneled track creation, closer to clavicle.   Ruthann Cancer, MD Pager: [phone]

## 2020-07-10 NOTE — Progress Notes (Signed)
Progress Note  Patient Name: Morgan Clay Date of Encounter: 07/10/2020  [facility] Cardiologist: No primary care provider on file.   Subjective   Pain around dialysis site, mild bleeding/oozing this morning.  Hemodynamics are stable.  Inpatient Medications    Scheduled Meds: . acetaminophen  500 mg Oral Q6H  . allopurinol  100 mg Oral Daily  . amiodarone  100 mg Oral Daily  . carvedilol  3.125 mg Oral BID WC  . Chlorhexidine Gluconate Cloth  6 each Topical Q0600  . diclofenac Sodium  4 g Topical QID  . feeding supplement  237 mL Oral BID BM  . gabapentin  300 mg Oral Daily  . heparin  5,000 Units Subcutaneous Q8H  . isosorbide-hydrALAZINE  0.5 tablet Oral TID  . levothyroxine  137 mcg Oral Q0600  . multivitamin  1 tablet Oral QHS  . pantoprazole  40 mg Oral Daily  . sodium chloride flush  3 mL Intravenous Q12H  . Thrombi-Pad  1 each Topical STAT   Continuous Infusions: . sodium chloride    . sodium chloride     PRN Meds: sodium chloride, sodium chloride, heparin, HYDROmorphone, ondansetron (ZOFRAN) IV, sodium chloride flush, traMADol, traZODone   Vital Signs    Vitals:   07/09/20 1640 07/09/20 2000 07/09/20 2347 07/10/20 0327  BP: (!) 141/69 (!) 145/62 (!) 134/50 110/63  Pulse: 64 68 66 66  Resp: 17 19 19 17   Temp: 98.3 F (36.8 C) 98.9 F (37.2 C) 99 F (37.2 C) 98.2 F (36.8 C)  TempSrc:  Oral Oral Oral  SpO2: 100% 97% 100% 96%  Weight:      Height:        Intake/Output Summary (Last 24 hours) at 07/10/2020 0823 Last data filed at 07/09/2020 1000 Gross per 24 hour  Intake 320 ml  Output --  Net 320 ml   Last 3 Weights 07/09/2020 07/08/2020 07/08/2020  Weight (lbs) 127 lb 3.3 oz 124 lb 9 oz 127 lb 13.9 oz  Weight (kg) 57.7 kg 56.5 kg 58 kg      Telemetry    AV pacing 60- Personally Reviewed  ECG    AV pacing- Personally Reviewed  Physical Exam   GEN: No acute distress.   Neck: No JVD Cardiac: RRR, 2/6 systolic murmur, no rubs, or  gallops.  Respiratory: Clear to auscultation bilaterally. GI: Soft, nontender, non-distended  MS: No edema; No deformity. Neuro:  Nonfocal  Psych: Normal affect   Labs    High Sensitivity Troponin:  No results for input(s): TROPONINIHS in the last 720 hours.    Chemistry Recent Labs  Lab 07/08/20 0345 07/09/20 0359 07/10/20 0430  NA 133* 133* 126*  K 4.2 4.3 5.4*  CL 96* 96* 91*  CO2 25 26 25   GLUCOSE 96 103* 86  BUN 41* 23 32*  CREATININE 3.24* 3.45* 4.61*  CALCIUM 9.6 10.1 10.0  ALBUMIN 3.7 3.9 3.9  GFRNONAA 15* 14* 10*  ANIONGAP 12 11 10      Hematology Recent Labs  Lab 07/07/20 0956 07/08/20 0825 07/10/20 0430  WBC 6.3 6.0 7.4  RBC 3.99 4.03 3.96  HGB 11.1* 11.6* 11.5*  HCT 35.6* 35.9* 35.4*  MCV 89.2 89.1 89.4  MCH 27.8 28.8 29.0  MCHC 31.2 32.3 32.5  RDW 20.4* 20.5* 20.6*  PLT 221 155 169    BNPNo results for input(s): BNP, PROBNP in the last 168 hours.   DDimer No results for input(s): DDIMER in the last 168 hours.  Radiology    No results found.  Cardiac Studies    Echo: EF 55-60%, moderate LVH, D-shaped interventricular septum, RV moderately dilated with low normal function, severe biatrial enlargement, severe TR.    RHC Procedural Findings (2/17): Hemodynamics (mmHg) RA mean 21 RV 49/20 PA 59/24, mean 39 PCWP mean 26 Oxygen saturations: PA 64% AO 98% Cardiac Output (Fick) 5.07  Cardiac Index (Fick) 3.23 PVR 2.6 WU Cardiac Output (Thermo) 4.72  Cardiac Index (Thermo) 3.01  PVR 2.75 WU PAPI 1.7    Assessment & Plan    Chronic primary diastolic heart failure with prominent RV dysfunction -History of presumed nonischemic cardiomyopathy with EF as low as 25 to 30% in 2015 -Medtronic CRT-D device placed with improvement of LV function and in 2019 EF was improved to 60 to 65%. -PYP scan in March 2020 was equivocal for transthyretin amyloidosis, PYP scan repeated in 2022 was not suggestive of transthyretin amyloidosis -Left and  right heart cath in August 2021 showed nonobstructive coronary artery disease with normal filling pressures and cardiac output -Echo in August 2021 showed EF of about 50 to 55% with mildly decreased RV systolic function and moderate aortic insufficiency -She has been NYHA class IIIb with symptoms of worsening volume overload and renal failure recently. -Right heart catheterization on February 16 showed elevated right greater than left heart filling pressures preserved cardiac output.  VQ scan not suggestive of chronic PEs. -She was admitted for diuresis and monitoring of renal function -Repeat echo during this hospitalization showed EF of about 55% with moderate LVH and D-shaped septum with moderately dilated RV low function.  Has severe TR. -This confirmed RV failure. -Continuing with BiDil 1/2 tablet 3 times daily -Continue with low-dose carvedilol 3.125 mg  Chronic kidney disease stage V -Nephrology following, consult notes reviewed/progress notes.  She is now end-stage renal disease and completed the first few days of dialysis.  Some pain around dialysis catheter site.  Mild bleeding/oozing this morning -Vascular has been consulted for graft placement -Outpatient HD arrangements [facility] Monday Wednesday Friday-renal navigator  Atrial fibrillation/flutter -Has atypical flutter and atrial fibrillation. -Has been maintaining normal sinus rhythm on amiodarone 100 mg a day. -She is no longer on anticoagulation, apixaban, secondary to recurrent severe GI bleeding.  Risks outweigh benefits. -She has been referred to Dr. Quentin Ore for evaluation of watchman device.  She probably may be able to tolerate short-term anticoagulation.  Anemia -This was after recurrent severe GI bleeding.  Received Feraheme  Essential hypertension -On lower dose of BiDil as above.  Carvedilol low-dose.  Cirrhosis -Compensated cirrhosis no ascites.  Hyponatremia -Mild.  When it seems reasonable from nephrology  standpoint, she may be discharged home with follow-up with the advanced heart failure clinic.  For questions or updates, please contact [place] Please consult [email or web address] for contact info under        Signed, Candee Furbish, MD  07/10/2020, 8:23 AM

## 2020-07-10 NOTE — Progress Notes (Signed)
At shift change, off going RN and oncoming RN made aware of slight oozing at dialysis site. Pressure applied. At HD, patient's dialysis site continued to trickle, HD RN informed primary RN and patient returned back to the unit. RN notified [place] MD and ordered thrombo pad from pharmacy. RN Brewing technologist applied thrombo pad and change dialysis dressing. Patient states she feels fine but just pain from the site itself. VSS and RN will continue to monitor for any other bleeding around the site.

## 2020-07-10 NOTE — Progress Notes (Signed)
Patient arrived to the unit with continuous leaking catheter. Swollen area  and painful to touch.  MD Coladonato notified. Alerted floor RN Beverlee Nims Roma unable to initiate treatment. Changed catheter dressing returned patient to room.

## 2020-07-10 NOTE — Plan of Care (Signed)
?  Problem: Education: ?Goal: Knowledge of General Education information will improve ?Description: Including pain rating scale, medication(s)/side effects and non-pharmacologic comfort measures ?Outcome: Progressing ?  ?Problem: Health Behavior/Discharge Planning: ?Goal: Ability to manage health-related needs will improve ?Outcome: Progressing ?  ?Problem: Clinical Measurements: ?Goal: Ability to maintain clinical measurements within normal limits will improve ?Outcome: Progressing ?Goal: Diagnostic test results will improve ?Outcome: Progressing ?Goal: Respiratory complications will improve ?Outcome: Progressing ?Goal: Cardiovascular complication will be avoided ?Outcome: Progressing ?  ?Problem: Activity: ?Goal: Risk for activity intolerance will decrease ?Outcome: Progressing ?  ?

## 2020-07-11 DIAGNOSIS — I5033 Acute on chronic diastolic (congestive) heart failure: Secondary | ICD-10-CM | POA: Diagnosis not present

## 2020-07-11 LAB — RENAL FUNCTION PANEL
Albumin: 3.7 g/dL (ref 3.5–5.0)
Anion gap: 11 (ref 5–15)
BUN: 18 mg/dL (ref 8–23)
CO2: 27 mmol/L (ref 22–32)
Calcium: 9.1 mg/dL (ref 8.9–10.3)
Chloride: 94 mmol/L — ABNORMAL LOW (ref 98–111)
Creatinine, Ser: 2.6 mg/dL — ABNORMAL HIGH (ref 0.44–1.00)
GFR, Estimated: 19 mL/min — ABNORMAL LOW (ref >60)
Glucose, Bld: 113 mg/dL — ABNORMAL HIGH (ref 70–99)
Phosphorus: 2.7 mg/dL (ref 2.5–4.6)
Potassium: 4 mmol/L (ref 3.5–5.1)
Sodium: 132 mmol/L — ABNORMAL LOW (ref 135–145)

## 2020-07-11 NOTE — Progress Notes (Signed)
Patient ID: Morgan Clay, female   DOB: March 10, 1946, 75 y.o.   MRN: [id number] S: Still complaining of right neck/chest pain. O:BP 117/60 (BP Location: Right Arm)   Pulse 67   Temp 98.5 F (36.9 C) (Oral)   Resp 15   Ht 4\' 11"  (1.499 m)   Wt 57.8 kg   SpO2 100%   BMI 25.75 kg/m   Intake/Output Summary (Last 24 hours) at 07/11/2020 1101 Last data filed at 07/11/2020 0941 Gross per 24 hour  Intake 240 ml  Output 1050 ml  Net -810 ml   Intake/Output: I/O last 3 completed shifts: In: 120 [P.O.:120] Out: 1000 [Other:1000]  Intake/Output this shift:  Total I/O In: 240 [P.O.:240] Out: 50 [Urine:50] Weight change:  Gen: NAD CVS: RRR Resp: CTA Abd: +BS,soft, NT/ND Ext: no edema  Recent Labs  Lab 07/05/20 0254 07/06/20 0400 07/07/20 0409 07/08/20 0345 07/09/20 0359 07/10/20 0430 07/11/20 0256  NA 124* 132* 129* 133* 133* 126* 132*  K 4.4 3.5 4.3 4.2 4.3 5.4* 4.0  CL 84* 93* 93* 96* 96* 91* 94*  CO2 26 25 24 25 26 25 27   GLUCOSE 108* 112* 102* 96 103* 86 113*  BUN 140* 64* 74* 41* 23 32* 18  CREATININE 4.13* 2.42* 2.98* 3.24* 3.45* 4.61* 2.60*  ALBUMIN 4.0 4.0 3.9 3.7 3.9 3.9 3.7  CALCIUM 10.1 9.7 9.6 9.6 10.1 10.0 9.1  PHOS 4.7* 2.1* 3.9 4.1 3.5 4.9* 2.7   Liver Function Tests: Recent Labs  Lab 07/09/20 0359 07/10/20 0430 07/11/20 0256  ALBUMIN 3.9 3.9 3.7   No results for input(s): LIPASE, AMYLASE in the last 168 hours. No results for input(s): AMMONIA in the last 168 hours. CBC: Recent Labs  Lab 07/05/20 0254 07/06/20 0400 07/07/20 0956 07/08/20 0825 07/10/20 0430  WBC 7.0 5.9 6.3 6.0 7.4  HGB 11.1* 11.9* 11.1* 11.6* 11.5*  HCT 34.2* 35.4* 35.6* 35.9* 35.4*  MCV 84.9 85.1 89.2 89.1 89.4  PLT 270 257 221 155 169   Cardiac Enzymes: No results for input(s): CKTOTAL, CKMB, CKMBINDEX, TROPONINI in the last 168 hours. CBG: No results for input(s): GLUCAP in the last 168 hours.  Iron Studies: No results for input(s): IRON, TIBC, TRANSFERRIN, FERRITIN  in the last 72 hours. Studies/Results: No results found. [name] acetaminophen  500 mg Oral Q6H  . allopurinol  100 mg Oral Daily  . amiodarone  100 mg Oral Daily  . carvedilol  3.125 mg Oral BID WC  . Chlorhexidine Gluconate Cloth  6 each Topical Q0600  . diclofenac Sodium  4 g Topical QID  . feeding supplement  237 mL Oral BID BM  . gabapentin  300 mg Oral Daily  . heparin  5,000 Units Subcutaneous Q8H  . isosorbide-hydrALAZINE  0.5 tablet Oral TID  . levothyroxine  137 mcg Oral Q0600  . multivitamin  1 tablet Oral QHS  . pantoprazole  40 mg Oral Daily  . sodium chloride flush  3 mL Intravenous Q12H    BMET    Component Value Date/Time   NA 132 (L) 07/11/2020 0256   NA 140 12/25/2018 1514   K 4.0 07/11/2020 0256   CL 94 (L) 07/11/2020 0256   CO2 27 07/11/2020 0256   GLUCOSE 113 (H) 07/11/2020 0256   BUN 18 07/11/2020 0256   BUN 33 (H) 12/25/2018 1514   CREATININE 2.60 (H) 07/11/2020 0256   CREATININE 2.24 (H) 02/04/2020 1416   CALCIUM 9.1 07/11/2020 0256   GFRNONAA 19 (L) 07/11/2020 0256  GFRNONAA 21 (L) 02/04/2020 1416   GFRAA 25 (L) 02/04/2020 1416   CBC    Component Value Date/Time   WBC 7.4 07/10/2020 0430   RBC 3.96 07/10/2020 0430   HGB 11.5 (L) 07/10/2020 0430   HGB 11.0 (L) 06/19/2019 1010   HGB 8.4 (L) 10/04/2017 1534   HCT 35.4 (L) 07/10/2020 0430   HCT 26.1 (L) 10/04/2017 1534   PLT 169 07/10/2020 0430   PLT 192 06/19/2019 1010   PLT 201 10/04/2017 1534   MCV 89.4 07/10/2020 0430   MCV 87 10/04/2017 1534   MCH 29.0 07/10/2020 0430   MCHC 32.5 07/10/2020 0430   RDW 20.6 (H) 07/10/2020 0430   RDW 17.3 (H) 10/04/2017 1534   LYMPHSABS 0.7 06/23/2020 1224   LYMPHSABS 2.1 10/04/2017 1534   MONOABS 0.8 06/23/2020 1224   EOSABS 0.1 06/23/2020 1224   EOSABS 0.2 10/04/2017 1534   BASOSABS 0.0 06/23/2020 1224   BASOSABS 0.0 10/04/2017 1534     Assessment/Plan:  1. AKI/CKD stage IV - in setting of ABLA and diastolic CHF (prominent RV dysfunction)  without significant recovery. Mainly cardiorenal physiology with worsening BUN/CR with diuresis. Not sure if she will tolerate HD given biventricular failure. Discussed with Morgan Clay and she is willing to try HD.She has tolerated 1. S/p [facility] placed2[facility]22by Dr. Vernard Gambles 2. First HD session 07/06/20 and toleratedit on 3/2 and 3/3 without issues. 3. Try to avoid hypotension with HD and UF as tolerated 4. Will follow UOP and plan for HD on Sat. 5. Will need outpatient VVS consult for AVF/AVG placementgiven CKD stage IV at baseline. 6. Plan for HD today now that bleeding HD catheter has been resolved. Appreciate IR assistance.  7. Starting to make some urine but no significant change in renal function.  8. [facility] and can go 07/12/20 or later. 2. Chronic combined systolic and diastolic CHF with significant RV dysfunction. Heart Failure team following.  3. Atrial flutter/fibrillation - rate controlled on amiodarone. Off anticoagulation due to recurrent and severe GI bleeding. Possible Watchman device placement. 4. Anemia of CKD and chronic GI blood loss 5. HTN - stable 6. Cirrhosis - stable, no ascites 7. Hyponatremia -improvedwith HD. 8. Disposition- pt wishes to be full code and continue with IHD. Appreciate Palliative care consult. Has outpatient appointment on 07/12/20 if stable for discharge this weekend.  If she stays will consult VVS for AVF/AVG placement.    Morgan Potts, MD [name] [phone]

## 2020-07-11 NOTE — Plan of Care (Signed)

## 2020-07-11 NOTE — Progress Notes (Addendum)
Progress Note  Patient Name: Morgan Clay Date of Encounter: 07/11/2020  [facility] Cardiologist: No primary care provider on file.   Subjective   Still having some discomfort around dialysis catheter site, but improved today.  No shortness of breath.  Inpatient Medications    Scheduled Meds: . acetaminophen  500 mg Oral Q6H  . allopurinol  100 mg Oral Daily  . amiodarone  100 mg Oral Daily  . carvedilol  3.125 mg Oral BID WC  . Chlorhexidine Gluconate Cloth  6 each Topical Q0600  . diclofenac Sodium  4 g Topical QID  . feeding supplement  237 mL Oral BID BM  . gabapentin  300 mg Oral Daily  . heparin  5,000 Units Subcutaneous Q8H  . isosorbide-hydrALAZINE  0.5 tablet Oral TID  . levothyroxine  137 mcg Oral Q0600  . multivitamin  1 tablet Oral QHS  . pantoprazole  40 mg Oral Daily  . sodium chloride flush  3 mL Intravenous Q12H   Continuous Infusions: . sodium chloride    . sodium chloride     PRN Meds: sodium chloride, sodium chloride, HYDROmorphone, ondansetron (ZOFRAN) IV, sodium chloride flush, traMADol, traZODone   Vital Signs    Vitals:   07/11/20 0519 07/11/20 0524 07/11/20 0846 07/11/20 0900  BP: (!) 102/47  117/60   Pulse: 65  67   Resp: 18  15   Temp: 98.3 F (36.8 C)   98.5 F (36.9 C)  TempSrc: Oral   Oral  SpO2: 100%  100%   Weight:  57.8 kg    Height:        Intake/Output Summary (Last 24 hours) at 07/11/2020 1048 Last data filed at 07/11/2020 0941 Gross per 24 hour  Intake 240 ml  Output 1050 ml  Net -810 ml   Last 3 Weights 07/11/2020 07/10/2020 07/09/2020  Weight (lbs) 127 lb 8 oz 131 lb 9.8 oz 127 lb 3.3 oz  Weight (kg) 57.834 kg 59.7 kg 57.7 kg      Telemetry    AV paced 60- Personally Reviewed  ECG    AV pacing- Personally Reviewed  Physical Exam   GEN: Well nourished, well developed, in no acute distress  HEENT: normal  Neck: no JVD, carotid bruits, or masses Cardiac: RRR; 2/6 SM, no rubs, or gallops,no edema   Respiratory:  clear to auscultation bilaterally, normal work of breathing GI: soft, nontender, nondistended, + BS MS: no deformity or atrophy  Skin: warm and dry, no rash Neuro:  Alert and Oriented x 3, Strength and sensation are intact Psych: euthymic mood, full affect   Labs    High Sensitivity Troponin:  No results for input(s): TROPONINIHS in the last 720 hours.    Chemistry Recent Labs  Lab 07/09/20 0359 07/10/20 0430 07/11/20 0256  NA 133* 126* 132*  K 4.3 5.4* 4.0  CL 96* 91* 94*  CO2 26 25 27   GLUCOSE 103* 86 113*  BUN 23 32* 18  CREATININE 3.45* 4.61* 2.60*  CALCIUM 10.1 10.0 9.1  ALBUMIN 3.9 3.9 3.7  GFRNONAA 14* 10* 19*  ANIONGAP 11 10 11      Hematology Recent Labs  Lab 07/07/20 0956 07/08/20 0825 07/10/20 0430  WBC 6.3 6.0 7.4  RBC 3.99 4.03 3.96  HGB 11.1* 11.6* 11.5*  HCT 35.6* 35.9* 35.4*  MCV 89.2 89.1 89.4  MCH 27.8 28.8 29.0  MCHC 31.2 32.3 32.5  RDW 20.4* 20.5* 20.6*  PLT 221 155 169    BNPNo results for  input(s): BNP, PROBNP in the last 168 hours.   DDimer No results for input(s): DDIMER in the last 168 hours.   Radiology    No results found.  Cardiac Studies    Echo: EF 55-60%, moderate LVH, D-shaped interventricular septum, RV moderately dilated with low normal function, severe biatrial enlargement, severe TR.    RHC Procedural Findings (2/17): Hemodynamics (mmHg) RA mean 21 RV 49/20 PA 59/24, mean 39 PCWP mean 26 Oxygen saturations: PA 64% AO 98% Cardiac Output (Fick) 5.07  Cardiac Index (Fick) 3.23 PVR 2.6 WU Cardiac Output (Thermo) 4.72  Cardiac Index (Thermo) 3.01  PVR 2.75 WU PAPI 1.7    Assessment & Plan    Chronic primary diastolic heart failure with prominent RV dysfunction -History of presumed nonischemic cardiomyopathy with EF as low as 25 to 30% in 2015 -Medtronic CRT-D device placed with improvement of LV function and in 2019 EF was improved to 60 to 65%. -PYP scan in March 2020 was equivocal  for transthyretin amyloidosis, PYP scan repeated in 2022 was not suggestive of transthyretin amyloidosis -Left and right heart cath in August 2021 showed nonobstructive coronary artery disease with normal filling pressures and cardiac output -Echo in August 2021 showed EF of about 50 to 55% with mildly decreased RV systolic function and moderate aortic insufficiency -She has been NYHA class IIIb with symptoms of worsening volume overload and renal failure recently. -Right heart catheterization on February 16 showed elevated right greater than left heart filling pressures preserved cardiac output.  VQ scan not suggestive of chronic PEs. -She was admitted for diuresis and monitoring of renal function -Repeat echo during this hospitalization showed EF of about 55% with moderate LVH and D-shaped septum with moderately dilated RV low function.  Has severe TR. -This confirmed RV failure. -Continuing with current management strategy of hydralazine/isosorbide as well as low-dose carvedilol.   Chronic kidney disease stage V -Nephrology following, consult notes reviewed/progress notes.  She is now end-stage renal disease and completed the first few days of dialysis.  Some pain around dialysis catheter site.  Mild bleeding/oozing yesterday morning. -Vascular has been consulted for graft placement -Outpatient HD arrangements [facility] Monday Wednesday Friday-renal navigator -No changes.  Atrial fibrillation/flutter -Has atypical flutter and atrial fibrillation. -Has been maintaining normal sinus rhythm on amiodarone 100 mg a day. -She is no longer on anticoagulation, apixaban, secondary to recurrent severe GI bleeding.  Risks outweigh benefits. -She has been referred to Dr. Quentin Ore for evaluation of watchman device.  She probably may be able to tolerate short-term anticoagulation. -Currently stable.  No changes  Anemia -This was after recurrent severe GI bleeding.  Received Feraheme on this admission.   Stable.  Essential hypertension -On lower dose of BiDil as above.  Carvedilol low-dose.  No changes made.  Cirrhosis -Compensated cirrhosis no ascites.  Hyponatremia -Mild.  When it seems reasonable from nephrology standpoint, she may be discharged home with follow-up with the advanced heart failure clinic.  For questions or updates, please contact [place] Please consult [email or web address] for contact info under        Signed, Candee Furbish, MD  07/11/2020, 10:48 AM

## 2020-07-12 DIAGNOSIS — I5033 Acute on chronic diastolic (congestive) heart failure: Secondary | ICD-10-CM | POA: Diagnosis not present

## 2020-07-12 LAB — CBC
HCT: 34.7 % — ABNORMAL LOW (ref 36.0–46.0)
Hemoglobin: 10.8 g/dL — ABNORMAL LOW (ref 12.0–15.0)
MCH: 27.6 pg (ref 26.0–34.0)
MCHC: 31.1 g/dL (ref 30.0–36.0)
MCV: 88.5 fL (ref 80.0–100.0)
Platelets: 125 10*3/uL — ABNORMAL LOW (ref 150–400)
RBC: 3.92 MIL/uL (ref 3.87–5.11)
RDW: 20.3 % — ABNORMAL HIGH (ref 11.5–15.5)
WBC: 6.4 10*3/uL (ref 4.0–10.5)
nRBC: 0 % (ref 0.0–0.2)

## 2020-07-12 LAB — RENAL FUNCTION PANEL
Albumin: 3.7 g/dL (ref 3.5–5.0)
Anion gap: 14 (ref 5–15)
BUN: 32 mg/dL — ABNORMAL HIGH (ref 8–23)
CO2: 21 mmol/L — ABNORMAL LOW (ref 22–32)
Calcium: 9.6 mg/dL (ref 8.9–10.3)
Chloride: 93 mmol/L — ABNORMAL LOW (ref 98–111)
Creatinine, Ser: 3.73 mg/dL — ABNORMAL HIGH (ref 0.44–1.00)
GFR, Estimated: 12 mL/min — ABNORMAL LOW (ref >60)
Glucose, Bld: 102 mg/dL — ABNORMAL HIGH (ref 70–99)
Phosphorus: 4.2 mg/dL (ref 2.5–4.6)
Potassium: 3.8 mmol/L (ref 3.5–5.1)
Sodium: 128 mmol/L — ABNORMAL LOW (ref 135–145)

## 2020-07-12 MED ORDER — ALLOPURINOL 100 MG PO TABS: 100.0000 mg | ORAL_TABLET | Freq: Every day | ORAL | 0 refills | Status: DC

## 2020-07-12 MED ORDER — GABAPENTIN 300 MG PO CAPS: 300.0000 mg | ORAL_CAPSULE | Freq: Every day | ORAL | 2 refills | Status: DC

## 2020-07-12 MED ORDER — CARVEDILOL 3.125 MG PO TABS: 3.1250 mg | ORAL_TABLET | Freq: Two times a day (BID) | ORAL | 5 refills | Status: DC

## 2020-07-12 MED ORDER — HEPARIN SODIUM (PORCINE) 1000 UNIT/ML IJ SOLN
INTRAMUSCULAR | Status: AC
  Filled 2020-07-12: qty 4

## 2020-07-12 MED ORDER — ISOSORB DINITRATE-HYDRALAZINE 20-37.5 MG PO TABS: 0.5000 | ORAL_TABLET | Freq: Three times a day (TID) | ORAL | 5 refills | Status: DC

## 2020-07-12 NOTE — Progress Notes (Signed)
Patient ID: Morgan Clay, female   DOB: [birth year], 75 y.o.   MRN: [id number] S: Continues to have neck and head pain.  Discussed possibility of hematoma in that area but states that her neck pain started before the catheter was placed.  Unclear cause.  Patient was seen on dialysis and tolerating it well O:BP 134/61 (BP Location: Right Arm)   Pulse 64   Temp 98.6 F (37 C) (Oral)   Resp (!) 22   Ht 4\' 11"  (1.499 m)   Wt 56.5 kg   SpO2 100%   BMI 25.16 kg/m   Intake/Output Summary (Last 24 hours) at 07/12/2020 1210 Last data filed at 07/12/2020 1156 Gross per 24 hour  Intake 363 ml  Output 2000 ml  Net -1637 ml   Intake/Output: I/O last 3 completed shifts: In: 71 [P.O.:720] Out: 1050 [Urine:50; Other:1000]  Intake/Output this shift:  Total I/O In: 3 [I.V.:3] Out: 2000 [Other:2000] Weight change: -2 kg Gen: NAD CVS: Normal rate Resp: Bilateral chest rise with no increased work of breathing Abd: +BS,soft, NT/ND Ext: no edema  Recent Labs  Lab 07/06/20 0400 07/07/20 0409 07/08/20 0345 07/09/20 0359 07/10/20 0430 07/11/20 0256 07/12/20 0508  NA 132* 129* 133* 133* 126* 132* 128*  K 3.5 4.3 4.2 4.3 5.4* 4.0 3.8  CL 93* 93* 96* 96* 91* 94* 93*  CO2 25 24 25 26 25 27  21*  GLUCOSE 112* 102* 96 103* 86 113* 102*  BUN 64* 74* 41* 23 32* 18 32*  CREATININE 2.42* 2.98* 3.24* 3.45* 4.61* 2.60* 3.73*  ALBUMIN 4.0 3.9 3.7 3.9 3.9 3.7 3.7  CALCIUM 9.7 9.6 9.6 10.1 10.0 9.1 9.6  PHOS 2.1* 3.9 4.1 3.5 4.9* 2.7 4.2   Liver Function Tests: Recent Labs  Lab 07/10/20 0430 07/11/20 0256 07/12/20 0508  ALBUMIN 3.9 3.7 3.7   No results for input(s): LIPASE, AMYLASE in the last 168 hours. No results for input(s): AMMONIA in the last 168 hours. CBC: Recent Labs  Lab 07/06/20 0400 07/07/20 0956 07/08/20 0825 07/10/20 0430 07/12/20 0800  WBC 5.9 6.3 6.0 7.4 6.4  HGB 11.9* 11.1* 11.6* 11.5* 10.8*  HCT 35.4* 35.6* 35.9* 35.4* 34.7*  MCV 85.1 89.2 89.1 89.4 88.5  PLT 257  221 155 169 125*   Cardiac Enzymes: No results for input(s): CKTOTAL, CKMB, CKMBINDEX, TROPONINI in the last 168 hours. CBG: No results for input(s): GLUCAP in the last 168 hours.  Iron Studies: No results for input(s): IRON, TIBC, TRANSFERRIN, FERRITIN in the last 72 hours. Studies/Results: No results found. [name] acetaminophen  500 mg Oral Q6H  . allopurinol  100 mg Oral Daily  . amiodarone  100 mg Oral Daily  . carvedilol  3.125 mg Oral BID WC  . Chlorhexidine Gluconate Cloth  6 each Topical Q0600  . diclofenac Sodium  4 g Topical QID  . feeding supplement  237 mL Oral BID BM  . gabapentin  300 mg Oral Daily  . heparin  5,000 Units Subcutaneous Q8H  . heparin sodium (porcine)      . isosorbide-hydrALAZINE  0.5 tablet Oral TID  . levothyroxine  137 mcg Oral Q0600  . multivitamin  1 tablet Oral QHS  . pantoprazole  40 mg Oral Daily  . sodium chloride flush  3 mL Intravenous Q12H    BMET    Component Value Date/Time   NA 128 (L) 07/12/2020 0508   NA 140 12/25/2018 1514   K 3.8 07/12/2020 0508   CL 93 (L) 07/12/2020 5102  CO2 21 (L) 07/12/2020 0508   GLUCOSE 102 (H) 07/12/2020 0508   BUN 32 (H) 07/12/2020 0508   BUN 33 (H) 12/25/2018 1514   CREATININE 3.73 (H) 07/12/2020 0508   CREATININE 2.24 (H) 02/04/2020 1416   CALCIUM 9.6 07/12/2020 0508   GFRNONAA 12 (L) 07/12/2020 0508   GFRNONAA 21 (L) 02/04/2020 1416   GFRAA 25 (L) 02/04/2020 1416   CBC    Component Value Date/Time   WBC 6.4 07/12/2020 0800   RBC 3.92 07/12/2020 0800   HGB 10.8 (L) 07/12/2020 0800   HGB 11.0 (L) 06/19/2019 1010   HGB 8.4 (L) 10/04/2017 1534   HCT 34.7 (L) 07/12/2020 0800   HCT 26.1 (L) 10/04/2017 1534   PLT 125 (L) 07/12/2020 0800   PLT 192 06/19/2019 1010   PLT 201 10/04/2017 1534   MCV 88.5 07/12/2020 0800   MCV 87 10/04/2017 1534   MCH 27.6 07/12/2020 0800   MCHC 31.1 07/12/2020 0800   RDW 20.3 (H) 07/12/2020 0800   RDW 17.3 (H) 10/04/2017 1534   LYMPHSABS 0.7 06/23/2020 1224    LYMPHSABS 2.1 10/04/2017 1534   MONOABS 0.8 06/23/2020 1224   EOSABS 0.1 06/23/2020 1224   EOSABS 0.2 10/04/2017 1534   BASOSABS 0.0 06/23/2020 1224   BASOSABS 0.0 10/04/2017 1534     Assessment/Plan:  1. AKI/CKD stage IV - in setting of ABLA and diastolic CHF (prominent RV dysfunction) without significant recovery. Mainly cardiorenal physiology with worsening BUN/CR with diuresis. Has tolerated dialysis relatively well 1. S/p [facility]22by Dr. Vernard Gambles 2. Status post induction sessions now MWF 3. BVS consulted for access creation 4. [facility] and can go 07/12/20 or later. 2. Chronic combined systolic and diastolic CHF with significant RV dysfunction. Heart Failure team following.  3. Atrial flutter/fibrillation - rate controlled on amiodarone. Off anticoagulation due to recurrent and severe GI bleeding. Possible Watchman device placement. 4. Anemia of CKD and chronic GI blood loss 5. HTN - stable 6. Cirrhosis - stable, no ascites 7. Hyponatremia -improvedwith HD. 8. Disposition- pt wishes to be full code and continue with IHD. Appreciate Palliative care consult. Has outpatient appointment on 07/12/20 if stable for discharge this weekend.  Consulting VVS.  Possible creation of access before she leaves

## 2020-07-12 NOTE — Plan of Care (Signed)
  Problem: Education: Goal: Knowledge of General Education information will improve Description: Including pain rating scale, medication(s)/side effects and non-pharmacologic comfort measures Outcome: Adequate for Discharge   

## 2020-07-12 NOTE — Progress Notes (Signed)
D/C instructions given and reviewed. No questions asked but encouraged to call with any concerns. Tele and IV removed, tolerated well. 

## 2020-07-12 NOTE — Progress Notes (Signed)
Patient ID: Morgan Clay, female   DOB: October 10, 1945, 75 y.o.   MRN: [id number]     Advanced Heart Failure Rounding Note  PCP-Cardiologist: No primary care provider on file.   Subjective:    -2/28 TDC placement and first dialysis session  Getting dialysis today, only complaint is headache.   Echo: EF 55-60%, moderate LVH, D-shaped interventricular septum, RV moderately dilated with low normal function, severe biatrial enlargement, severe TR.    RHC Procedural Findings (2/17): Hemodynamics (mmHg) RA mean 21 RV 49/20 PA 59/24, mean 39 PCWP mean 26 Oxygen saturations: PA 64% AO 98% Cardiac Output (Fick) 5.07  Cardiac Index (Fick) 3.23 PVR 2.6 WU Cardiac Output (Thermo) 4.72  Cardiac Index (Thermo) 3.01  PVR 2.75 WU PAPI 1.7  Objective:   Weight Range: 58.9 kg Body mass index is 26.23 kg/m.   Vital Signs:   Temp:  [98.1 F (36.7 C)-98.4 F (36.9 C)] 98.1 F (36.7 C) (03/07 0724) Pulse Rate:  [64-67] 64 (03/06 2024) Resp:  [12-23] (P) 13 (03/07 1030) BP: (109-153)/(44-66) (P) 91/48 (03/07 1030) SpO2:  [100 %] 100 % (03/07 0724) Weight:  [57.7 kg-58.9 kg] 58.9 kg (03/07 0724) Last BM Date: 07/11/20  Weight change: Filed Weights   07/11/20 0524 07/12/20 0500 07/12/20 0724  Weight: 57.8 kg 57.7 kg 58.9 kg    Intake/Output:   Intake/Output Summary (Last 24 hours) at 07/12/2020 1053 Last data filed at 07/11/2020 1828 Gross per 24 hour  Intake 360 ml  Output -  Net 360 ml      Physical Exam  General: NAD Neck: No JVD, no thyromegaly or thyroid nodule.  Lungs: Clear to auscultation bilaterally with normal respiratory effort. CV: Nondisplaced PMI.  Heart regular S1/S2, no S3/S4, 2/6 HSM LLSB.  No peripheral edema.   Abdomen: Soft, nontender, no hepatosplenomegaly, no distention.  Skin: Intact without lesions or rashes.  Neurologic: Alert and oriented x 3.  Psych: Normal affect. Extremities: No clubbing or cyanosis.  HEENT: Normal.    Telemetry   A-V  paced 60s   EKG    No new EKG to review today   Labs    CBC Recent Labs    07/10/20 0430 07/12/20 0800  WBC 7.4 6.4  HGB 11.5* 10.8*  HCT 35.4* 34.7*  MCV 89.4 88.5  PLT 169 735*   Basic Metabolic Panel Recent Labs    07/11/20 0256 07/12/20 0508  NA 132* 128*  K 4.0 3.8  CL 94* 93*  CO2 27 21*  GLUCOSE 113* 102*  BUN 18 32*  CREATININE 2.60* 3.73*  CALCIUM 9.1 9.6  PHOS 2.7 4.2   Liver Function Tests Recent Labs    07/11/20 0256 07/12/20 0508  ALBUMIN 3.7 3.7   No results for input(s): LIPASE, AMYLASE in the last 72 hours. Cardiac Enzymes No results for input(s): CKTOTAL, CKMB, CKMBINDEX, TROPONINI in the last 72 hours.  BNP: BNP (last 3 results) Recent Labs    06/18/20 1300 06/21/20 1501 06/23/20 1224  BNP 1,147.8* 981.7* 1,099.2*    ProBNP (last 3 results) Recent Labs    07/17/19 1352 07/23/19 1033  PROBNP 811.0* 637.0*     D-Dimer No results for input(s): DDIMER in the last 72 hours. Hemoglobin A1C No results for input(s): HGBA1C in the last 72 hours. Fasting Lipid Panel No results for input(s): CHOL, HDL, LDLCALC, TRIG, CHOLHDL, LDLDIRECT in the last 72 hours. Thyroid Function Tests No results for input(s): TSH, T4TOTAL, T3FREE, THYROIDAB in the last 72 hours.  Invalid input(s):  FREET3  Other results:   Imaging    No results found.   Medications:     Scheduled Medications: . acetaminophen  500 mg Oral Q6H  . allopurinol  100 mg Oral Daily  . amiodarone  100 mg Oral Daily  . carvedilol  3.125 mg Oral BID WC  . Chlorhexidine Gluconate Cloth  6 each Topical Q0600  . diclofenac Sodium  4 g Topical QID  . feeding supplement  237 mL Oral BID BM  . gabapentin  300 mg Oral Daily  . heparin  5,000 Units Subcutaneous Q8H  . isosorbide-hydrALAZINE  0.5 tablet Oral TID  . levothyroxine  137 mcg Oral Q0600  . multivitamin  1 tablet Oral QHS  . pantoprazole  40 mg Oral Daily  . sodium chloride flush  3 mL Intravenous Q12H     Infusions: . sodium chloride    . sodium chloride      PRN Medications: sodium chloride, sodium chloride, HYDROmorphone, ondansetron (ZOFRAN) IV, sodium chloride flush, traMADol, traZODone    Assessment/Plan   1. Chronic primarily diastolic CHF with prominent RV dysfunction: Patient has history of presumed nonischemic cardiomyopathy, EF as low at 25-30% in 12/15.  Medtronic CRT-D device placed with improvement in LV function, echo in 4/19 showed EF improved to 60-65%. PYP scan in 3/20 was equivocal for transthyretin amyloidosis, PYP scan in 1/22 was not suggestive of transthyretin amyloidosis. LHC/RHC in 8/21 showed nonobstructive CAD, normal filling pressures and cardiac output. Echo 8/21 showed EF 50-55%, mildly decreased RV systolic function, moderate AI. NYHA class IIIb symptoms with progressive volume overload and renal failure recently.  [place] 2/16 showed elevated R>L heart filling pressures with low PAPI at 1.7 and preserved cardiac output. V/Q scan not suggestive of chronic PEs.  Decision made to admit for diuresis and monitoring of renal function. Repeat echo in hospital showed EF 55-60%, moderate LVH, D-shaped interventricular septum, RV moderately dilated with low normal function, severe biatrial enlargement, severe TR => confirming prominent RV failure.  Breathing significantly better since admission.  Tolerating HD for fluid removal.  BP stable.  - Getting HD today.  - Continue Bidil 1/2 tab tid.  - Continue coreg 3.125mg  2. Atrial flutter/fibrillation: History of atypical flutter and atrial fibrillation.Lately has been in NSR on amiodarone 100 mg daily. She is off apixaban with recurrent severe GI bleeding.  - Continue amiodarone.  - Has been referred to Dr. Quentin Ore for evaluation for Watchman device. 3. CKD stage V: Followed by nephrology as outpatient. Creatinine worsened during admission, now ESRD.  - Getting HD today.  4. Anemia: Recurrent severe GI bleeding. She is  now off Eliquis. Transferrin saturation 5% at admission.  - Received Feraheme 2/16  - As above, Watchman evaluation eventually. 5. Hypertension:stable/ improved w/ Bidil dose reduction     - continue on reduced Bidil 1/2 tab tid, carvedilol 3.125mg  BID  6. Cirrhosis: Was not able to order elastography inpatient but US shows stable, compensated cirrhosis with normal doppler studies with no ascites  Disposition: ?Home today after HD if ok with nephrology.   Length of Stay: [place], MD  07/12/2020, 10:53 AM  Advanced Heart Failure Team Pager [phone] (M-F; 7a - 4p)  Please contact [facility] for night-coverage after hours (4p -7a ) and weekends on [email or web address]

## 2020-07-12 NOTE — Progress Notes (Addendum)
Round on patient secondary to plan to D/C home today. Patient's son-in-law at the bedside. Patient reports that she had some bleeding at the catheter site on Friday night and that the surgeon came up to work on it and it hasn't bled since. Site appears clean and intact. Patient still endorses some pain at the insertion site and that she will continue to use her cream. Plan to go to outpatient appointment for AVF plan and placement. Patient is clear on her scheduled dialysis days and times. Patient with no further questions at this time.    Dorthey Sawyer, RN  Dialysis Nurse Coordinator [phone]

## 2020-07-13 ENCOUNTER — Telehealth: Payer: Self-pay | Admitting: Nephrology

## 2020-07-13 ENCOUNTER — Encounter (HOSPITAL_COMMUNITY): Payer: Medicare Other

## 2020-07-13 ENCOUNTER — Telehealth: Payer: Self-pay | Admitting: Family Medicine

## 2020-07-13 NOTE — Telephone Encounter (Signed)
Transition of Care Contact from [place]   Date of Discharge: 07/12/20 Date of Contact: 07/13/20 Method of contact: phone Talked to patient   Patient contacted to discuss transition of care form recent hospitaliztion. Patient was admitted to [facility] from 06/23/20 to 07/12/20 with the discharge diagnosis of acute on chronic diastolic HF, AKI on CKD stage 4>>ESRD requiring dialysis.   Medication changes were reviewed, patient picked up medications earlier today.  Patient will follow up with is outpatient dialysis center tomorrow, 07/13/20.   Other follow up needs include none identified.    Jen Mow, PA-C [name] Pager: [phone]

## 2020-07-13 NOTE — Telephone Encounter (Signed)
Stacy from [facility] is calling and states the patient and family would like them to follow the patient for Palliative Care.  She is requesting a verbal order.  Please advise.

## 2020-07-21 ENCOUNTER — Ambulatory Visit (INDEPENDENT_AMBULATORY_CARE_PROVIDER_SITE_OTHER): Payer: Medicare Other

## 2020-07-21 ENCOUNTER — Telehealth: Payer: Self-pay | Admitting: Family Medicine

## 2020-07-21 DIAGNOSIS — I5042 Chronic combined systolic (congestive) and diastolic (congestive) heart failure: Secondary | ICD-10-CM

## 2020-07-21 DIAGNOSIS — Z95810 Presence of automatic (implantable) cardiac defibrillator: Secondary | ICD-10-CM | POA: Diagnosis not present

## 2020-07-21 NOTE — Telephone Encounter (Signed)
Morgan Clay is calling and stated that patient was recently discharged from the hospital and  is requesting orders to follow patient for palliative care in the home, please advise. CB is [phone] opt 2

## 2020-07-21 NOTE — Telephone Encounter (Signed)
Okay 

## 2020-07-21 NOTE — Telephone Encounter (Signed)
Called number provided, was not able to reach [place] or any one through the options listed.

## 2020-07-23 ENCOUNTER — Telehealth: Payer: Self-pay

## 2020-07-23 ENCOUNTER — Other Ambulatory Visit: Payer: Self-pay | Admitting: *Deleted

## 2020-07-23 DIAGNOSIS — N184 Chronic kidney disease, stage 4 (severe): Secondary | ICD-10-CM

## 2020-07-23 NOTE — Telephone Encounter (Signed)
-----   Message from [name], RN sent at 07/23/2020 10:06 AM EDT ----- Regarding: AT/AF [place] Good morning,  3/16 Carelink report says 27% time in AT/AF.  Pt is off Eliquis due to hx of bleeding.  She was in NSR in Feb.  Could you review and discuss with Dr Caryl Comes if needed?   Thanks,  Margarita Grizzle

## 2020-07-23 NOTE — Telephone Encounter (Signed)
Spoke with [place], giving verbal orders.

## 2020-07-23 NOTE — Progress Notes (Signed)
EPIC Encounter for ICM Monitoring  Patient Name: Morgan Clay is a 75 y.o. female Date: 07/23/2020 Primary Care Physican: Billie Ruddy, MD Primary Cardiologist:[place] Electrophysiologist: Caryl Comes Nephrologist:[facility] Pacing:100% 1/17/2022Weight:140lbs  Clinical Status (22-Jun-2020 to 21-Jul-2020) AT/AF 10 episodes  Time in AT/AF 6.5 hr/day (27.0%)   Spoke with patient.  She is now on dialysis and no further ICM follow up needed since dialysis will manage her fluid levels.    Optivol thoracic impedancesuggesting normal fluid levels since 06/30/2020.  Message sent to device clinic to review time in AT/AF of 27%.    Labs: 07/12/2020 Creatinine 3.73, BUN 32, Potassium 3.8, Sodium 128, GFR 12 07/11/2020 Creatinine 2.60, BUN 18, Potassium 4.0, Sodium 132, GFR 19  07/10/2020 Creatinine 4.61, BUN 32, Potassium 5.4, Sodium 126, GFR 10  07/10/2019 Creatinine 3.45, BUN 23, Potassium 4.3, Sodium 133, GFR 14  A complete set of results can be found in Results Review.  Recommendations:No changes.  Follow-up plan: No further ICM clinic follow up since patient is on dialysis.91 day device clinic remote transmission3/29/2022.  EP/Cardiology Office Visits:07/27/2020 with HF clinic.Due to schedule 1 year OV with Dr Caryl Comes for March 2022 (Last telehealth visit was 07/24/2019)  Copy of ICM check sent to [place]  3 month ICM trend: 07/21/2020.    1 Year ICM trend:       [name], RN 07/23/2020 10:00 AM

## 2020-07-23 NOTE — Telephone Encounter (Signed)
Transmission reviewed.  It appears pt has been in AF since 07/12/20.  Of note she was discharged from [facility] on that date following a lengthy stay for HF.   Spoke with pt, she denies any current symptoms.  She was not awre of being in AF.  Pt previously managed in AF clinic and would lie to f/u there for this episode.

## 2020-07-23 NOTE — Telephone Encounter (Signed)
Patient follows up with HF PA on 3/22. Will determine follow up needs with this visit.

## 2020-07-23 NOTE — Telephone Encounter (Signed)
Attempted to contact patient to schedule a Palliative Care consult appointment. No answer and unable to leave a message voicemail is not setup.

## 2020-07-23 NOTE — Telephone Encounter (Signed)
Spoke with [place], giving verbal orders.

## 2020-07-26 ENCOUNTER — Telehealth: Payer: Self-pay

## 2020-07-26 ENCOUNTER — Encounter (HOSPITAL_COMMUNITY): Payer: Medicare Other

## 2020-07-26 NOTE — Telephone Encounter (Signed)
Spoke with patient and scheduled an in-person Palliative Consult for 08/03/20 @ 10AM. Patient has dialysis on Monday, Wednesday, and Friday.  COVID screening was negative. Will put pets away before NP arrive. Patient's daughter lives with her.   Consent obtained; updated Outlook/Netsmart/Team List and Epic.  Family is aware they may be receiving a call from NP the day before or day of to confirm appointment.

## 2020-07-27 ENCOUNTER — Ambulatory Visit (INDEPENDENT_AMBULATORY_CARE_PROVIDER_SITE_OTHER)
Admission: RE | Admit: 2020-07-27 | Discharge: 2020-07-27 | Disposition: A | Discharge status: Home / Self Care | Payer: Medicare Other | Source: Ambulatory Visit | Attending: Vascular Surgery | Admitting: Vascular Surgery

## 2020-07-27 ENCOUNTER — Encounter (HOSPITAL_COMMUNITY): Payer: Self-pay

## 2020-07-27 ENCOUNTER — Ambulatory Visit (HOSPITAL_COMMUNITY)
Admission: RE | Admit: 2020-07-27 | Discharge: 2020-07-27 | Disposition: A | Discharge status: Home / Self Care | Payer: Medicare Other | Source: Ambulatory Visit | Attending: Adult Health | Admitting: Adult Health

## 2020-07-27 ENCOUNTER — Ambulatory Visit: Payer: Medicare Other | Admitting: Vascular Surgery

## 2020-07-27 ENCOUNTER — Other Ambulatory Visit: Payer: Self-pay

## 2020-07-27 ENCOUNTER — Encounter: Payer: Self-pay | Admitting: Vascular Surgery

## 2020-07-27 VITALS — BP 138/60 | HR 69 | Wt 129.6 lb

## 2020-07-27 VITALS — BP 116/63 | HR 60 | Temp 97.3°F | Resp 20 | Ht 59.0 in | Wt 124.0 lb

## 2020-07-27 DIAGNOSIS — N186 End stage renal disease: Secondary | ICD-10-CM

## 2020-07-27 DIAGNOSIS — I482 Chronic atrial fibrillation, unspecified: Secondary | ICD-10-CM

## 2020-07-27 DIAGNOSIS — N184 Chronic kidney disease, stage 4 (severe): Secondary | ICD-10-CM | POA: Diagnosis not present

## 2020-07-27 DIAGNOSIS — Z79899 Other long term (current) drug therapy: Secondary | ICD-10-CM | POA: Insufficient documentation

## 2020-07-27 DIAGNOSIS — I5042 Chronic combined systolic (congestive) and diastolic (congestive) heart failure: Secondary | ICD-10-CM

## 2020-07-27 DIAGNOSIS — I480 Paroxysmal atrial fibrillation: Secondary | ICD-10-CM | POA: Diagnosis not present

## 2020-07-27 DIAGNOSIS — Z006 Encounter for examination for normal comparison and control in clinical research program: Secondary | ICD-10-CM

## 2020-07-27 DIAGNOSIS — I2510 Atherosclerotic heart disease of native coronary artery without angina pectoris: Secondary | ICD-10-CM | POA: Diagnosis not present

## 2020-07-27 DIAGNOSIS — Z95810 Presence of automatic (implantable) cardiac defibrillator: Secondary | ICD-10-CM

## 2020-07-27 DIAGNOSIS — D500 Iron deficiency anemia secondary to blood loss (chronic): Secondary | ICD-10-CM | POA: Insufficient documentation

## 2020-07-27 DIAGNOSIS — I428 Other cardiomyopathies: Secondary | ICD-10-CM

## 2020-07-27 DIAGNOSIS — I5032 Chronic diastolic (congestive) heart failure: Secondary | ICD-10-CM | POA: Diagnosis present

## 2020-07-27 DIAGNOSIS — K7460 Unspecified cirrhosis of liver: Secondary | ICD-10-CM | POA: Diagnosis not present

## 2020-07-27 DIAGNOSIS — Z8711 Personal history of peptic ulcer disease: Secondary | ICD-10-CM | POA: Diagnosis not present

## 2020-07-27 DIAGNOSIS — I132 Hypertensive heart and chronic kidney disease with heart failure and with stage 5 chronic kidney disease, or end stage renal disease: Secondary | ICD-10-CM | POA: Insufficient documentation

## 2020-07-27 DIAGNOSIS — Z992 Dependence on renal dialysis: Secondary | ICD-10-CM

## 2020-07-27 DIAGNOSIS — Z8249 Family history of ischemic heart disease and other diseases of the circulatory system: Secondary | ICD-10-CM | POA: Diagnosis not present

## 2020-07-27 LAB — BASIC METABOLIC PANEL
Anion gap: 13 (ref 5–15)
BUN: 20 mg/dL (ref 8–23)
CO2: 30 mmol/L (ref 22–32)
Calcium: 9.4 mg/dL (ref 8.9–10.3)
Chloride: 89 mmol/L — ABNORMAL LOW (ref 98–111)
Creatinine, Ser: 4.13 mg/dL — ABNORMAL HIGH (ref 0.44–1.00)
GFR, Estimated: 11 mL/min — ABNORMAL LOW (ref >60)
Glucose, Bld: 97 mg/dL (ref 70–99)
Potassium: 3.8 mmol/L (ref 3.5–5.1)
Sodium: 132 mmol/L — ABNORMAL LOW (ref 135–145)

## 2020-07-27 LAB — MAGNESIUM: Magnesium: 2.4 mg/dL (ref 1.7–2.4)

## 2020-07-27 MED ORDER — AMIODARONE HCL 200 MG PO TABS: 200.0000 mg | ORAL_TABLET | Freq: Every day | ORAL | 3 refills | Status: DC

## 2020-07-27 NOTE — H&P (View-Only) (Signed)
[facility] [place]  ASSESSMENT / PLAN: Morgan Clay is a 75 y.o. right handed female in need of permanent hemodialysis access. I reviewed options for dialysis in detail with the patient. I counseled the patient that dialysis access requires surveillance and periodic maintenance. Plan to proceed with left first stage brachiobasilic AVF in near future.   CHIEF COMPLAINT: need HD access  HISTORY OF PRESENT ILLNESS: Morgan Clay is a 75 y.o. female referred to clinic for permanent dialysis access. She is right handed. She is currently dialyzing MWF via a RIJ TDC. She has never had a fistula or graft before.   Past Medical History:  Diagnosis Date  . A-fib ([place])    on Eliquis  . AICD (automatic cardioverter/defibrillator) present 10/06/2015  . Anemia   . Angiodysplasia of small intestine ([place]) 06/10/2020   Ileum - seen on capsule endoscopy 05/2020 - ablated at colonoscopy  . ARF (acute renal failure) ([place])   . Arthritis    "qwhere" (01/03/2018)  . Asthma    reports mild asthma since childhood - had COPD on dx list from prior PCP  . Chronic bronchitis ([place])    "get it most years; not this past year though" (01/03/2018)  . Chronic kidney disease    "? stage;  followed by [facility]; [name]" (01/03/2018)  . Chronic systolic congestive heart failure ([place])   . COVID-19 05/03/2020  . DEPRESSION 12/17/2009   Annotation: PHQ-9 score = 14 done on 12/17/2009 Qualifier: Diagnosis of  By: Hassell Done FNP, Tori Milks    . Diabetes mellitus without complication (HCC)    DIET CONTROLLED   . Diverticulosis   . Enteritis   . FIBROIDS, UTERUS 03/05/2008  . GI bleeding 01/03/2018  . Glaucoma   . Gout   . Hepatitis C    HEPATITIS C - s/p treatment with Harvoni, saw hepatology, Dawn Drazek  . History of blood transfusion ~ 11/2017  . Hyperlipemia 12/06/2012  . HYPERTENSION, BENIGN 04/24/2007  . Hypothyroidism   . LBBB (left bundle branch block)   . Lower GI bleeding     "been dealing w/it since 07/2017" (01/03/2018)  . Nonischemic cardiomyopathy ([place])   . OBESITY 05/27/2009  . OBSTRUCTIVE SLEEP APNEA 11/14/2007   no CPAP  . OSTEOPENIA 09/30/2008  . Pain and swelling of left upper extremity 08/08/2017  . Personal history of other infectious and parasitic disease    Hepatitis B  . Pneumonia    "several times" (01/03/2018)  . Pulmonary edema, acute ([place]) 08/09/2017    Past Surgical History:  Procedure Laterality Date  . APPENDECTOMY    . CARDIAC CATHETERIZATION    . CARDIOVERSION N/A 12/14/2014   Procedure: CARDIOVERSION;  Surgeon: Dorothy Spark, MD;  Location: [facility] ENDOSCOPY;  Service: Cardiovascular;  Laterality: N/A;  . CARDIOVERSION N/A 02/26/2017   Procedure: CARDIOVERSION;  Surgeon: Evans Lance, MD;  Location: [place] CV LAB;  Service: Cardiovascular;  Laterality: N/A;  . CARDIOVERSION N/A 07/24/2017   Procedure: CARDIOVERSION;  Surgeon: Thayer Headings, MD;  Location: [facility] ENDOSCOPY;  Service: Cardiovascular;  Laterality: N/A;  . CARDIOVERSION N/A 02/12/2020   Procedure: CARDIOVERSION;  Surgeon: [name], MD;  Location: [facility] ENDOSCOPY;  Service: Cardiovascular;  Laterality: N/A;  . COLONOSCOPY N/A 11/08/2017   Procedure: COLONOSCOPY;  Surgeon: Milus Banister, MD;  Location: [facility] ENDOSCOPY;  Service: Endoscopy;  Laterality: N/A;  . COLONOSCOPY WITH PROPOFOL N/A 05/31/2020   Procedure: COLONOSCOPY WITH PROPOFOL;  Surgeon: Irene Shipper, MD;  Location:  [place] ENDOSCOPY;  Service: Endoscopy;  Laterality: N/A;  . DILATION AND CURETTAGE OF UTERUS    . ENTEROSCOPY N/A 03/02/2020   Procedure: ENTEROSCOPY;  Surgeon: Yetta Flock, MD;  Location: [facility] ENDOSCOPY;  Service: Gastroenterology;  Laterality: N/A;  . EP IMPLANTABLE DEVICE N/A 10/06/2015   Procedure: BIV ICD Generator Changeout;  Surgeon: [name], MD;  Location: [place] CV LAB;  Service: Cardiovascular;  Laterality: N/A;  . ESOPHAGOGASTRODUODENOSCOPY N/A 10/26/2017   Procedure:  ESOPHAGOGASTRODUODENOSCOPY (EGD);  Surgeon: Ladene Artist, MD;  Location: [facility] ENDOSCOPY;  Service: Endoscopy;  Laterality: N/A;  . ESOPHAGOGASTRODUODENOSCOPY (EGD) WITH PROPOFOL N/A 11/07/2017   Procedure: ESOPHAGOGASTRODUODENOSCOPY (EGD) WITH PROPOFOL;  Surgeon: Milus Banister, MD;  Location: [facility] ENDOSCOPY;  Service: Endoscopy;  Laterality: N/A;  . ESOPHAGOGASTRODUODENOSCOPY (EGD) WITH PROPOFOL N/A 05/29/2020   Procedure: ESOPHAGOGASTRODUODENOSCOPY (EGD) WITH PROPOFOL;  Surgeon: Doran Stabler, MD;  Location: [place];  Service: Gastroenterology;  Laterality: N/A;  . GIVENS CAPSULE STUDY N/A 05/19/2020   Procedure: GIVENS CAPSULE STUDY;  Surgeon: [name], MD;  Location: [place];  Service: Endoscopy;  Laterality: N/A;  .adm for obs since pacemaker, PA wil enter order and see pt  . GIVENS CAPSULE STUDY N/A 05/29/2020   Procedure: GIVENS CAPSULE STUDY;  Surgeon: Doran Stabler, MD;  Location: [place];  Service: Gastroenterology;  Laterality: N/A;  . HOT HEMOSTASIS N/A 10/26/2017   Procedure: HOT HEMOSTASIS (ARGON PLASMA COAGULATION/BICAP);  Surgeon: Ladene Artist, MD;  Location: [facility] ENDOSCOPY;  Service: Endoscopy;  Laterality: N/A;  . HOT HEMOSTASIS N/A 03/02/2020   Procedure: HOT HEMOSTASIS (ARGON PLASMA COAGULATION/BICAP);  Surgeon: Yetta Flock, MD;  Location: [facility] ENDOSCOPY;  Service: Gastroenterology;  Laterality: N/A;  . HOT HEMOSTASIS N/A 05/31/2020   Procedure: HOT HEMOSTASIS (ARGON PLASMA COAGULATION/BICAP);  Surgeon: Irene Shipper, MD;  Location: [facility] ENDOSCOPY;  Service: Endoscopy;  Laterality: N/A;  . INSERT / REPLACE / REMOVE PACEMAKER  ?2008  . IR PERC TUN PERIT CATH WO PORT S&I Dartha Lodge  07/05/2020  . POLYPECTOMY  11/08/2017   Procedure: POLYPECTOMY;  Surgeon: Milus Banister, MD;  Location: [facility] ENDOSCOPY;  Service: Endoscopy;;  . PROCTOSCOPY N/A 05/22/2018   Procedure: RIGID PROCTOSCOPY;  Surgeon: Michael Boston, MD;  [facility]: [facility] ORS;  Service: General;   Laterality: N/A;  . RIGHT HEART CATH N/A 02/13/2018   Procedure: RIGHT HEART CATH;  Surgeon: [name], MD;  Location: [place] CV LAB;  Service: Cardiovascular;  Laterality: N/A;  . RIGHT HEART CATH N/A 06/23/2020   Procedure: RIGHT HEART CATH;  Surgeon: [name], MD;  Location: [place] CV LAB;  Service: Cardiovascular;  Laterality: N/A;  . RIGHT/LEFT HEART CATH AND CORONARY ANGIOGRAPHY N/A 12/11/2019   Procedure: RIGHT/LEFT HEART CATH AND CORONARY ANGIOGRAPHY;  Surgeon: [name], MD;  Location: [place] CV LAB;  Service: Cardiovascular;  Laterality: N/A;  . SUBMUCOSAL TATTOO INJECTION  03/02/2020   Procedure: SUBMUCOSAL TATTOO INJECTION;  Surgeon: Yetta Flock, MD;  Location: [facility] ENDOSCOPY;  Service: Gastroenterology;;  . Lia Foyer TATTOO INJECTION  05/31/2020   Procedure: SUBMUCOSAL TATTOO INJECTION;  Surgeon: Irene Shipper, MD;  Location: [place];  Service: Endoscopy;;  . TUBAL LIGATION    . XI ROBOTIC ASSISTED LOWER ANTERIOR RESECTION N/A 05/22/2018   Procedure: XI ROBOTIC ASSISTED SIGMOID COLOECTOMY MOBILIZATION OF SPLENIC FLEXURE, FIREFLY ASSESSMENT OF PERFUSION;  Surgeon: Michael Boston, MD;  Location: [facility] ORS;  Service: General;  Laterality: N/A;  ERAS PATHWAY  Family History  Problem Relation Age of Onset  . Asthma Father   . Heart attack Father   . Asthma Sister   . Lung cancer Sister   . Heart attack Mother   . Stroke Brother   . Colon cancer Neg Hx   . Esophageal cancer Neg Hx   . Pancreatic cancer Neg Hx   . Stomach cancer Neg Hx   . Liver disease Neg Hx     Social History   Socioeconomic History  . Marital status: Divorced    Spouse name: Not on file  . Number of children: 1  . Years of education: 63  . Highest education level: Not on file  Occupational History  . Not on file  Tobacco Use  . Smoking status: Never Smoker  . Smokeless tobacco: Never Used  Vaping Use  . Vaping Use: Never used  Substance and Sexual  Activity  . Alcohol use: Yes    Comment: 01/03/2018 "couple drinks/month"  . Drug use: Yes    Types: Marijuana    Comment: VERY INTERMITTENT   . Sexual activity: Not Currently  Other Topics Concern  . Not on file  Social History Narrative   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      Home Situation: lives alone      Spiritual Beliefs: Christian - [employer]      Lifestyle: no regular exercise; poor diet      She is divorced at least one adult child   Never smoker    rare alcohol to occasional at best    no substance abuse         Social Determinants of Health   Financial Resource Strain: Low Risk   . Difficulty of Paying Living Expenses: Not hard at all  Food Insecurity: No Food Insecurity  . Worried About Charity fundraiser in the Last Year: Never true  . Ran Out of Food in the Last Year: Never true  Transportation Needs: No Transportation Needs  . Lack of Transportation (Medical): No  . Lack of Transportation (Non-Medical): No  Physical Activity: Not on file  Stress: Not on file  Social Connections: Not on file  Intimate Partner Violence: Not on file    No Known Allergies  Current Outpatient Medications  Medication Sig Dispense Refill  . allopurinol (ZYLOPRIM) 100 MG tablet Take 1 tablet (100 mg total) by mouth daily. 30 tablet 0  . carvedilol (COREG) 3.125 MG tablet Take 1 tablet (3.125 mg total) by mouth 2 (two) times daily with a meal. 60 tablet 5  . cromolyn (OPTICROM) 4 % ophthalmic solution Place 1 drop into both eyes 2 (two) times daily.    . ferrous sulfate 325 (65 FE) MG tablet Take 1 tablet (325 mg total) by mouth daily. 90 tablet 3  . gabapentin (NEURONTIN) 300 MG capsule Take 1 capsule (300 mg total) by mouth daily. 180 capsule 2  . isosorbide-hydrALAZINE (BIDIL) 20-37.5 MG tablet Take 0.5 tablets by mouth 3 (three) times daily. 30 tablet 5  . levothyroxine (SYNTHROID) 137 MCG tablet Take 1 tablet (137 mcg total) by mouth daily before breakfast. 90  tablet 3  . nitroGLYCERIN (NITROSTAT) 0.4 MG SL tablet Place 0.4 mg under the tongue every 5 (five) minutes x 3 doses as needed for chest pain.     . pantoprazole (PROTONIX) 40 MG tablet Take 1 tablet (40 mg total) by mouth daily. 30 tablet 11  . traZODone (DESYREL) 50 MG tablet Take 50 mg  by mouth at bedtime.    [name] amiodarone (PACERONE) 200 MG tablet Take 1 tablet (200 mg total) by mouth daily. 90 tablet 3   No current facility-administered medications for this visit.    REVIEW OF SYSTEMS:  [X]  denotes positive finding, [ ]  denotes negative finding Cardiac  Comments:  Chest pain or chest pressure:    Shortness of breath upon exertion:    Short of breath when lying flat:    Irregular heart rhythm:        Vascular    Pain in calf, thigh, or hip brought on by ambulation:    Pain in feet at night that wakes you up from your sleep:     Blood clot in your veins:    Leg swelling:         Pulmonary    Oxygen at home:    Productive cough:     Wheezing:         Neurologic    Sudden weakness in arms or legs:     Sudden numbness in arms or legs:     Sudden onset of difficulty speaking or slurred speech:    Temporary loss of vision in one eye:     Problems with dizziness:         Gastrointestinal    Blood in stool:     Vomited blood:         Genitourinary    Burning when urinating:     Blood in urine:        Psychiatric    Major depression:         Hematologic    Bleeding problems:    Problems with blood clotting too easily:        Skin    Rashes or ulcers:        Constitutional    Fever or chills:      PHYSICAL EXAM  Vitals:   07/27/20 1111  BP: 116/63  Pulse: 60  Resp: 20  Temp: (!) 97.3 F (36.3 C)  SpO2: 100%  Weight: 124 lb (56.2 kg)  Height: 4\' 11"  (1.499 m)   Constitutional: well appearing. no distress. Appears well nourished.  Neurologic: CN intact. no focal findings. no sensory loss. Psychiatric: Mood and affect symmetric and appropriate. Eyes: No  icterus. No conjunctival pallor. Ears, nose, throat: mucous membranes moist. Midline trachea.  Cardiac: regular rate and rhythm.  Respiratory: unlabored. Abdominal: soft, non-tender, non-distended.  Peripheral vascular:  2+ radial pulses bilaterally  RIJ TDC in place Extremity: No edema. No cyanosis. No pallor.  Skin: No gangrene. No ulceration.  Lymphatic: No Stemmer's sign. No palpable lymphadenopathy.   PERTINENT LABORATORY AND RADIOLOGIC DATA  Most recent CBC CBC Latest Ref Rng & Units 07/12/2020 07/10/2020 07/08/2020  WBC 4.0 - 10.5 K/uL 6.4 7.4 6.0  Hemoglobin 12.0 - 15.0 g/dL 10.8(L) 11.5(L) 11.6(L)  Hematocrit 36.0 - 46.0 % 34.7(L) 35.4(L) 35.9(L)  Platelets 150 - 400 K/uL 125(L) 169 155     Most recent CMP CMP Latest Ref Rng & Units 07/12/2020 07/11/2020 07/10/2020  Glucose 70 - 99 mg/dL 102(H) 113(H) 86  BUN 8 - 23 mg/dL 32(H) 18 32(H)  Creatinine 0.44 - 1.00 mg/dL 3.73(H) 2.60(H) 4.61(H)  Sodium 135 - 145 mmol/L 128(L) 132(L) 126(L)  Potassium 3.5 - 5.1 mmol/L 3.8 4.0 5.4(H)  Chloride 98 - 111 mmol/L 93(L) 94(L) 91(L)  CO2 22 - 32 mmol/L 21(L) 27 25  Calcium 8.9 - 10.3 mg/dL 9.6 9.1 10.0  Total  Protein 6.5 - 8.1 g/dL - - -  Total Bilirubin 0.3 - 1.2 mg/dL - - -  Alkaline Phos 38 - 126 U/L - - -  AST 15 - 41 U/L - - -  ALT 0 - 44 U/L - - -    Renal function Estimated Creatinine Clearance: 10.4 mL/min (A) (by C-G formula based on SCr of 3.73 mg/dL (H)).  Hgb A1c MFr Bld (% of total Hgb)  Date Value  02/04/2020 6.1 (H)    LDL Cholesterol  Date Value Ref Range Status  07/23/2019 33 0 - 99 mg/dL Final      [name]. Stanford Breed, MD [facility] Phone Number: [phone] 07/27/2020 12:49 PM

## 2020-07-27 NOTE — Research (Addendum)
  [employer] Protocol [id number] AT [facility] Pilot  Patient Global Assessment Visual Analog Scale (EQ-VAS)        Subject Number:  16-009  Date Completed:        07/27/2020  Study Day:  Day 30       . We would like to know how good or bad your health is TODAY. [name] This scale is numbered 0 to 100. . 100 means the best health you can imagine.       0 means the worst health you can imagine. . Please let me know the number you feel indicated how your health is TODAY.    VAS SCORE - YOUR HEALTH TODAY : 95       Avoiding Treatment in the Hospital with Furoscix for the Management of Congestion in Heart Failure - A Pilot Study  AT HOME-HF Pilot    Subject Number:  16-009  Date Completed:        07/27/2020  Study Day:  Day 30    Vital Signs: Respiration Rate:  (bpm) 20 ReDS:  (%) 31

## 2020-07-27 NOTE — Patient Instructions (Addendum)
Labs done today. We will contact you only if your labs are abnormal.  INCREASE Amiodarone to 20mg  (1 tablet) by mouth daily.  No other medication changes were made. Please continue all current medications as prescribed.  Your physician recommends that you schedule a follow-up appointment in: 4-6 weeks   You have been referred to Dr. Quentin Ore at [facility]. They will contact you to schedule an appointment.    If you have any questions or concerns before your next appointment please send [name] a message through [place] or call our office at [phone].    TO LEAVE A MESSAGE FOR THE NURSE SELECT OPTION 2, PLEASE LEAVE A MESSAGE INCLUDING: . YOUR NAME . DATE OF BIRTH . CALL BACK NUMBER . REASON FOR CALL**this is important as we prioritize the call backs  YOU WILL RECEIVE A CALL BACK THE SAME DAY AS LONG AS YOU CALL BEFORE 4:00 PM   Do the following things EVERYDAY: 1) Weigh yourself in the morning before breakfast. Write it down and keep it in a log. 2) Take your medicines as prescribed 3) Eat low salt foods--Limit salt (sodium) to 2000 mg per day.  4) Stay as active as you can everyday 5) Limit all fluids for the day to less than 2 liters   At the [facility], you and your health needs are our priority. As part of our continuing mission to provide you with exceptional heart care, we have created designated Provider Care Teams. These Care Teams include your primary Cardiologist (physician) and Advanced Practice Providers (APPs- Physician Assistants and Nurse Practitioners) who all work together to provide you with the care you need, when you need it.   You may see any of the following providers on your designated Care Team at your next follow up: [name] Dr Glori Bickers . Dr Loralie Champagne . Darrick Grinder, NP . Lyda Jester, PA . Audry Riles, PharmD   Please be sure to bring in all your medications bottles to every appointment.

## 2020-07-27 NOTE — Research (Signed)
            Composite Congestion Score (CCS) Indicate the subject's current CCS Score at the present time (select for each signs/symptoms) Was CCS Performed:   ?  [x] Yes ?  [] No (Complete Protocol Deviation)     Signs/Symptoms 0 - None  1 -Seldom 2 -Frequent 3 -Continuous  Dyspnoea []  [x]  []  []   Orthopnoea [x]  [x]  []  []   Fatigue []  []  [x]  []    Signs/Symptoms 0 - <=6  1 - 6-9 2 - 10-15 3 - >=15  JVD (cm H2O) [x]  []  []  []    Signs/Symptoms 0 -None  1 -Bases 2 -To <50% 3 -To >50%  Rales [x]  []  []  []    Signs/Symptoms 0 -Absent/ trace  1 -Slight 2 -Moderate 3 -Marked  [place] [x]  []  []  []

## 2020-07-27 NOTE — Research (Signed)
                6 Minute Walk Was the 6 minute walk performed:    ?[] Yes ?  [x]No (Complete Protocol Deviation)  Vitals Before 6 Minute Walk Systolic Blood Pressure (mmHg) Diastolic Blood Pressure (mmHg) Heart Rate (BPM) Respiration Rate (BPM)        Vitals After 6 Minute Walk Systolic Blood Pressure (mmHg) Diastolic Blood Pressure (mmHg) Heart Rate (BPM) Respiration Rate (BPM)        Measured Before 6 Minute Walk Borg Scale Level of Shortness of breath _____  Borg Scale Level of Fatigue _____   Measured After 6 Minute Walk Borg Scale Level of Shortness of breath _____  Borg Scale Level of Fatigue _____   Distance covered in 6 minutes ??? _______: meters    Did the subject stop or pause before 6 minutes  ? []Yes (Complete Below) ?  []No   Reason for stopping Reason Response If Yes, Comments   Shortness of breath (HF-related) ? Yes[]  ? No[]    Light headedness (HF-related) ? Yes[]  ? No[]    Fatigue (HF-related) ? Yes[]  ? No[]    Other HF-related symptom ? Yes[]  ? No[]    Non-HF symptom(s) ? Yes[]  ? No[]      

## 2020-07-27 NOTE — Progress Notes (Signed)
PCP: Dr. Colin Benton Cardiology: Dr. Caryl Comes HF Cardiology: Dr. [name]  75 y.o. with history of nonischemic cardiomyopathy with recovery of EF with BiV pacing, recurrent GI bleeding, and paroxysmal atrial flutter was referred by Dr. Caryl Comes for evaluation of CHF.  Patient has a history of presumed nonischemic cardiomyopathy for a number of years.  Echo in 12/15 showed EF 25-30% and Cardiolite at that time showed no ischemia.  She had Medtronic CRT-D system placed, and EF recovered. In 4/19, echo showed EF 60-65%.    She has additionally had problems with GI bleeding.  She had been on Eliquis for atrial flutter but this was stopped with recurrent GI bleeding.  She had bleeding from a gastric ulcer but was also noted to have multiple small bowel AVMs on capsule endoscopy out of reach of endoscope.  Eliquis was stopped. She has had no melena or BRBPR off Eliquis.   Echo in 10/19 showed EF 55-60%, mild-moderate MR, moderate AI, moderate RV dilation.   She was admitted in 1/20 with diverticulitis and ended up having a sigmoid colectomy.    Echo in 9/20 showed EF lower at 45-50%, moderate AI.   She had LHC/RHC in 8/21 with nonobstructive CAD, normal filling pressures, and preserved cardiac output. Echo 8/21 showed EF 50-55%, mild LVH, mildly decreased RV systolic function, moderate aortic insufficiency.  Back in in A fib the end of October. Amio was increased and DC-CV was set up for November 19th.  Chemically converted so DC-CV canceled.    Seen in HF Clinic in 10/21 and was more fatigued. Found to be in atrial flutter but hgb also 6.9. She was on Eliquis at the time. Loaded on amio and converted to NSR. Patient underwent small bowel enteroscopy 03/02/2020 with findings trace esophageal varix, small superficial AVMs prepyloric stomach with other punctate vascular lesions treated with APC, benign duodenal polyp. Etiology likely secondary to jejunal AVM versus early GAVE vsportal gastropathy.Patient  underwent small bowel enteroscopy 03/02/2020 with findings trace esophageal varix, small superficial AVMs prepyloric stomach with other punctate vascular lesions treated with APC, benign duodenal polyp. Etiology likely secondary to jejunal AVM versus early GAVE vsportal gastropathy. Eliquis restarted.   Hgb dropped back down to 7.7 in 12/21. Saw [name] and capsule placed but got stuck in the stomach.   Had volume overload on device in 05/24/20 and torsemide increased by Dr. [name]   She was admitted on 05/27/20 with dizziness, and a fall preceded by melena found to have further GI bleeding hgb of 5.1 and an AKI.  Eliquis was held and she was transfused 2u PRBC's.  Repeat pill endoscope showed bleeding in distal small bowel.  Colonoscopy found no bleeding but large avm in cecum argon lasered.  She has been off anticoagulants.   OptiVol trending up since 06/14/20, given 80 mg IV lasix x 1 in clinic.   Admitted 06/23/20 from the cath with elevated filling pressures. Aggressive diuresed but had worsening renal function and progressed to ESRD. Nephrology consulted. She tolerated HD well and will continue on MWF schedule. Nephrology has arranged for outpatient HD and has arranged for outpatient consultation w/ [facility] for permanent HD access.  Today she returns for post HF follow up. Overall feeling fine. Getting HD M-W-F. No issues with dialysis.  Denies SOB/PND/Orthopnea. Appetite ok. No fever or chills. Weight at home 124 -126  pounds. Taking all medications.  Optivol: Afib since 07/12/20, No VT, fluid index low, thoracic impedance up and activity ~1 hour per day.  Labs (9/19):  Hgb 10.2, K 3.8, creatinine 2.5 Labs (02/26/2018): K 3.4, creatinine 2.2, WBC 76.  Labs (2/20): K 3.9, creatinine 1.8 Labs (8/20): K 3.8, creatinine 1.98 Labs (3/21): LDL 33 Labs (5/21): K 3.6, creatinine 1.69 Labs (7/21): K 4, creatinine 1.94 Labs (9/21): K 4.5, creatinine 2.24, TSH normal, HFTs normal  Labs (11/21): K  4.2 Creatinine 3.05  Labs (1/22): K 4.0, creatinine 2.22 Labs (2/22): K 4.1, creatinine 2.54  PMH: 1. Diabetes: diet-controlled.  2. Gout 3. HCV: Treated with Harvoni.  4. Atrial flutter: Atypical.  Had DCCV in 3/19, maintained on amiodarone.  5. HTN 6. CKD stage 3: Sees nephrology, [name].  7. H/o SBO 8. Obesity 9. Nonischemic cardiomyopathy: Echo in 12/15 with EF 25-30%.  Cardiolite in 12/15 with no ischemia.  She had a Medtronic CRT-D device placed with recovery of EF.   - Echo (4/19): EF 60-65% with moderate AI, moderate MR, and PASP 49 mmHg.  - RHC 02/13/2018 RA mean 6, RV 49/7,PA 50/18, mean 31,PCWP mean 20, Oxygen saturations:PA 67% AO 97%, Cardiac Output (Fick) 4.6, Cardiac Index (Fick) 2.9, PVR 2.4 WU - Echo (10/19): EF 55-60%, mild-moderate MR, moderate AI, moderate RV dilation, PASP 60 mmHg.  - PYP scan (3/20): H/CL 1.3, grade 1-2 visually.  Unlikely transthyretin cardiac amyloidosis.  - Echo (9/20): EF 45-50%, moderate aortic insufficiency.  - LHC/RHC (8/21): 40% pLAD, 50% D1; mean RA 3, PA 41/16, mean PCWP 14, CI 2.74, PVR 2.99.  - Echo (8/21): EF 50-55%, mild LVH, grade 2 diastolic dysfunction, normal RV size with mildly decreased systolic function, moderate AI, no AS - Echo 07/2020  EF 55-60%, moderate LVH, D-shaped interventricular septum, RV moderately dilated with low normal function, severe biatrial enlargement, severe TR.   - RHC 06/2020:  RA mean 21 RV 49/20 PA 59/24, mean 39 PCWP mean 26 Cardiac Output (Fick) 5.07 Cardiac Index (Fick) 3.23 PVR 2.6 WU Cardiac Output (Thermo) 4.72 Cardiac Index (Thermo) 3.01 PVR 2.75 WU PAPI 1.7.  10. GI bleeding: Gastric AVM and also gastric ulcer seen in past.  - 8/19 capsule endoscopy showed small bowel AVMs beyond the reach of endoscopy.  - With recurrent episodes of GI bleeding, Eliquis was stopped.  - Capsule study 1/22 showed bleeding in distal small bowel.  Colonoscopy found no bleeding but large avm in cecum argon lasered.   11. Fe deficiency anemia.  12. Depression. 13. OSA: Uses CPAP.  14. Diverticulitis: Sigmoid colectomy in 1/20.   ROS: All systems reviewed and negative except as per HPI.   Social History   Socioeconomic History  . Marital status: Divorced    Spouse name: Not on file  . Number of children: 1  . Years of education: 92  . Highest education level: Not on file  Occupational History  . Not on file  Tobacco Use  . Smoking status: Never Smoker  . Smokeless tobacco: Never Used  Vaping Use  . Vaping Use: Never used  Substance and Sexual Activity  . Alcohol use: Yes    Comment: 01/03/2018 "couple drinks/month"  . Drug use: Yes    Types: Marijuana    Comment: VERY INTERMITTENT   . Sexual activity: Not Currently  Other Topics Concern  . Not on file  Social History Narrative   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      Home Situation: lives alone      Spiritual Beliefs: [place]      Lifestyle: no regular exercise; poor diet  She is divorced at least one adult child   Never smoker    rare alcohol to occasional at best    no substance abuse         Social Determinants of Health   Financial Resource Strain: Low Risk   . Difficulty of Paying Living Expenses: Not hard at all  Food Insecurity: No Food Insecurity  . Worried About Charity fundraiser in the Last Year: Never true  . Ran Out of Food in the Last Year: Never true  Transportation Needs: No Transportation Needs  . Lack of Transportation (Medical): No  . Lack of Transportation (Non-Medical): No  Physical Activity: Not on file  Stress: Not on file  Social Connections: Not on file  Intimate Partner Violence: Not on file   Family History  Problem Relation Age of Onset  . Asthma Father   . Heart attack Father   . Asthma Sister   . Lung cancer Sister   . Heart attack Mother   . Stroke Brother   . Colon cancer Neg Hx   . Esophageal cancer Neg Hx   . Pancreatic cancer Neg Hx   . Stomach  cancer Neg Hx   . Liver disease Neg Hx     Current Outpatient Medications  Medication Sig Dispense Refill  . allopurinol (ZYLOPRIM) 100 MG tablet Take 1 tablet (100 mg total) by mouth daily. 30 tablet 0  . amiodarone (PACERONE) 100 MG tablet Take 1 tablet (100 mg total) by mouth daily. 30 tablet 0  . carvedilol (COREG) 3.125 MG tablet Take 1 tablet (3.125 mg total) by mouth 2 (two) times daily with a meal. 60 tablet 5  . cromolyn (OPTICROM) 4 % ophthalmic solution Place 1 drop into both eyes 2 (two) times daily.    . ferrous sulfate 325 (65 FE) MG tablet Take 1 tablet (325 mg total) by mouth daily. 90 tablet 3  . gabapentin (NEURONTIN) 300 MG capsule Take 1 capsule (300 mg total) by mouth daily. 180 capsule 2  . isosorbide-hydrALAZINE (BIDIL) 20-37.5 MG tablet Take 0.5 tablets by mouth 3 (three) times daily. 30 tablet 5  . levothyroxine (SYNTHROID) 137 MCG tablet Take 1 tablet (137 mcg total) by mouth daily before breakfast. 90 tablet 3  . nitroGLYCERIN (NITROSTAT) 0.4 MG SL tablet Place 0.4 mg under the tongue every 5 (five) minutes x 3 doses as needed for chest pain.     . pantoprazole (PROTONIX) 40 MG tablet Take 1 tablet (40 mg total) by mouth daily. 30 tablet 11  . traZODone (DESYREL) 50 MG tablet Take 50 mg by mouth at bedtime.     No current facility-administered medications for this encounter.   BP 138/60   Pulse 69   Wt 58.8 kg (129 lb 9.6 oz)   SpO2 99%   BMI 26.18 kg/m   Wt Readings from Last 3 Encounters:  07/27/20 58.8 kg (129 lb 9.6 oz)  07/27/20 56.2 kg (124 lb)  07/12/20 56.5 kg (124 lb 9 oz)   PHYSICAL EXAM:  General:  Well appearing. No resp difficulty HEENT: normal Neck: supple. no JVD. Carotids 2+ bilat; no bruits. No lymphadenopathy or thryomegaly appreciated. Cor: PMI nondisplaced. Regular rate & rhythm. No rubs, gallops or murmurs. R upper chest HD catheter.  Lungs: clear Abdomen: soft, nontender, nondistended. No hepatosplenomegaly. No bruits or masses.  Good bowel sounds. Extremities: no cyanosis, clubbing, rash, edema Neuro: alert & orientedx3, cranial nerves grossly intact. moves all 4 extremities w/o difficulty. Affect pleasant  Assessment/Plan: 1. NICM, Recovered, now with primarily diastolic CHF:  Echo in 7/67 showed EF improved to 60-65% with MDT BiV pacing. Echo in 10/19 showed stable EF 55-60% with mild-moderate MR, moderate AI. PYP scan in 3/20 was equivocal for transthyretin amyloidosis. However, echo in 9/20 showed EF back down to 45-50%. LHC/RHC in 8/21 showed nonobstructive CAD, normal filling pressures and cardiac output. Last ECHO 06/2020 EF was 55-60% a with grade II DD and RV low normal.  - she was in AT HOME trial in the usual care arm. This 30 day visit. Check BMET and Mag now   -NYHA II. Volume managed with HD. Optivol stable.  - Continue Bidil 1/2  tab tid.  -  Continue Coreg  3.125 mg twice a day.   - No arb/spiro with ESRD.  2. Atrial fibrillation/flutter: Paroxysmal.   - device interrogation today looks like she has been in A fib since 07/12/20   CHADS2VASC score of 5.   - Continue amiodarone.  - With recurrent GI bleeding,  off Eliquis for now.   - Discussed with Dr [name] will refer to Dr Quentin Ore for possible Watchman procedure. 3. ESRD Now on HD M-W-F Plan for fistula next week.  4. Anemia, chronic blood loss: Long history multiple bleeding events while on Eliquis and around 3 endoscopy/colonoscopies over the last 3 years. Sigmoid colectomy for recurrent diverticulitis. Eliquis has been stopped and started back in the past.  Cecal avm ablated.  - As above, will leave off Eliquis for now and work up for [employer].  5. Cirrhosis: S/p treatment with Harvoni for Hep C, followed by GI outpatient.  No current ascites and LFT's look good.    Follow up with Dr [name] in 3 months.   Jevonte Clanton NP-C  1:13 PM   07/27/20

## 2020-07-27 NOTE — Progress Notes (Signed)
[facility] [place]  ASSESSMENT / PLAN: Morgan Clay is a 75 y.o. right handed female in need of permanent hemodialysis access. I reviewed options for dialysis in detail with the patient. I counseled the patient that dialysis access requires surveillance and periodic maintenance. Plan to proceed with left first stage brachiobasilic AVF in near future.   CHIEF COMPLAINT: need HD access  HISTORY OF PRESENT ILLNESS: Morgan Clay is a 75 y.o. female referred to clinic for permanent dialysis access. She is right handed. She is currently dialyzing MWF via a RIJ TDC. She has never had a fistula or graft before.   Past Medical History:  Diagnosis Date  . A-fib ([place])    on Eliquis  . AICD (automatic cardioverter/defibrillator) present 10/06/2015  . Anemia   . Angiodysplasia of small intestine ([place]) 06/10/2020   Ileum - seen on capsule endoscopy 05/2020 - ablated at colonoscopy  . ARF (acute renal failure) ([place])   . Arthritis    "qwhere" (01/03/2018)  . Asthma    reports mild asthma since childhood - had COPD on dx list from prior PCP  . Chronic bronchitis ([place])    "get it most years; not this past year though" (01/03/2018)  . Chronic kidney disease    "? stage;  followed by [facility]; [name]" (01/03/2018)  . Chronic systolic congestive heart failure ([place])   . COVID-19 05/03/2020  . DEPRESSION 12/17/2009   Annotation: PHQ-9 score = 14 done on 12/17/2009 Qualifier: Diagnosis of  By: Hassell Done FNP, Tori Milks    . Diabetes mellitus without complication (HCC)    DIET CONTROLLED   . Diverticulosis   . Enteritis   . FIBROIDS, UTERUS 03/05/2008  . GI bleeding 01/03/2018  . Glaucoma   . Gout   . Hepatitis C    HEPATITIS C - s/p treatment with Harvoni, saw hepatology, Dawn Drazek  . History of blood transfusion ~ 11/2017  . Hyperlipemia 12/06/2012  . HYPERTENSION, BENIGN 04/24/2007  . Hypothyroidism   . LBBB (left bundle branch block)   . Lower GI bleeding     "been dealing w/it since 07/2017" (01/03/2018)  . Nonischemic cardiomyopathy ([place])   . OBESITY 05/27/2009  . OBSTRUCTIVE SLEEP APNEA 11/14/2007   no CPAP  . OSTEOPENIA 09/30/2008  . Pain and swelling of left upper extremity 08/08/2017  . Personal history of other infectious and parasitic disease    Hepatitis B  . Pneumonia    "several times" (01/03/2018)  . Pulmonary edema, acute ([place]) 08/09/2017    Past Surgical History:  Procedure Laterality Date  . APPENDECTOMY    . CARDIAC CATHETERIZATION    . CARDIOVERSION N/A 12/14/2014   Procedure: CARDIOVERSION;  Surgeon: Dorothy Spark, MD;  Location: [facility] ENDOSCOPY;  Service: Cardiovascular;  Laterality: N/A;  . CARDIOVERSION N/A 02/26/2017   Procedure: CARDIOVERSION;  Surgeon: Evans Lance, MD;  Location: [place] CV LAB;  Service: Cardiovascular;  Laterality: N/A;  . CARDIOVERSION N/A 07/24/2017   Procedure: CARDIOVERSION;  Surgeon: Thayer Headings, MD;  Location[facility]COPY;  Service: Cardiovascular;  Laterality: N/A;  . CARDIOVERSION N/A 02/12/2020   Procedure: CARDIOVERSION;  Surgeon: [name], MD;  Location: [facility] ENDOSCOPY;  Service: Cardiovascular;  Laterality: N/A;  . COLONOSCOPY N/A 11/08/2017   Procedure: COLONOSCOPY;  Surgeon: Milus Banister, MD;  Location: [facility] ENDOSCOPY;  Service: Endoscopy;  Laterality: N/A;  . COLONOSCOPY WITH PROPOFOL N/A 05/31/2020   Procedure: COLONOSCOPY WITH PROPOFOL;  Surgeon: Irene Shipper, MD;  Location:  [place] ENDOSCOPY;  Service: Endoscopy;  Laterality: N/A;  . DILATION AND CURETTAGE OF UTERUS    . ENTEROSCOPY N/A 03/02/2020   Procedure: ENTEROSCOPY;  Surgeon: Yetta Flock, MD;  Location: [facility] ENDOSCOPY;  Service: Gastroenterology;  Laterality: N/A;  . EP IMPLANTABLE DEVICE N/A 10/06/2015   Procedure: BIV ICD Generator Changeout;  Surgeon: [name], MD;  Location: [place] CV LAB;  Service: Cardiovascular;  Laterality: N/A;  . ESOPHAGOGASTRODUODENOSCOPY N/A 10/26/2017   Procedure:  ESOPHAGOGASTRODUODENOSCOPY (EGD);  Surgeon: Ladene Artist, MD;  Location: [facility];  Service: Endoscopy;  Laterality: N/A;  . ESOPHAGOGASTRODUODENOSCOPY (EGD) WITH PROPOFOL N/A 11/07/2017   Procedure: ESOPHAGOGASTRODUODENOSCOPY (EGD) WITH PROPOFOL;  Surgeon: Milus Banister, MD;  Location: [facility] ENDOSCOPY;  Service: Endoscopy;  Laterality: N/A;  . ESOPHAGOGASTRODUODENOSCOPY (EGD) WITH PROPOFOL N/A 05/29/2020   Procedure: ESOPHAGOGASTRODUODENOSCOPY (EGD) WITH PROPOFOL;  Surgeon: Doran Stabler, MD;  Location: [place];  Service: Gastroenterology;  Laterality: N/A;  . GIVENS CAPSULE STUDY N/A 05/19/2020   Procedure: GIVENS CAPSULE STUDY;  Surgeon: [name], MD;  Location: [place];  Service: Endoscopy;  Laterality: N/A;  .adm for obs since pacemaker, PA wil enter order and see pt  . GIVENS CAPSULE STUDY N/A 05/29/2020   Procedure: GIVENS CAPSULE STUDY;  Surgeon: Doran Stabler, MD;  Location: [place];  Service: Gastroenterology;  Laterality: N/A;  . HOT HEMOSTASIS N/A 10/26/2017   Procedure: HOT HEMOSTASIS (ARGON PLASMA COAGULATION/BICAP);  Surgeon: Ladene Artist, MD;  Location: [facility] ENDOSCOPY;  Service: Endoscopy;  Laterality: N/A;  . HOT HEMOSTASIS N/A 03/02/2020   Procedure: HOT HEMOSTASIS (ARGON PLASMA COAGULATION/BICAP);  Surgeon: Yetta Flock, MD;  Location: [facility] ENDOSCOPY;  Service: Gastroenterology;  Laterality: N/A;  . HOT HEMOSTASIS N/A 05/31/2020   Procedure: HOT HEMOSTASIS (ARGON PLASMA COAGULATION/BICAP);  Surgeon: Irene Shipper, MD;  Location: [facility] ENDOSCOPY;  Service: Endoscopy;  Laterality: N/A;  . INSERT / REPLACE / REMOVE PACEMAKER  ?2008  . IR PERC TUN PERIT CATH WO PORT S&I Dartha Lodge  07/05/2020  . POLYPECTOMY  11/08/2017   Procedure: POLYPECTOMY;  Surgeon: Milus Banister, MD;  Location: [facility] ENDOSCOPY;  Service: Endoscopy;;  . PROCTOSCOPY N/A 05/22/2018   Procedure: RIGID PROCTOSCOPY;  Surgeon: Michael Boston, MD;  Location: [facility] ORS;  Service: General;   Laterality: N/A;  . RIGHT HEART CATH N/A 02/13/2018   Procedure: RIGHT HEART CATH;  Surgeon: [name], MD;  Location: [place] CV LAB;  Service: Cardiovascular;  Laterality: N/A;  . RIGHT HEART CATH N/A 06/23/2020   Procedure: RIGHT HEART CATH;  Surgeon: [name], MD;  Location: [place] CV LAB;  Service: Cardiovascular;  Laterality: N/A;  . RIGHT/LEFT HEART CATH AND CORONARY ANGIOGRAPHY N/A 12/11/2019   Procedure: RIGHT/LEFT HEART CATH AND CORONARY ANGIOGRAPHY;  Surgeon: [name], MD;  Location: [place] CV LAB;  Service: Cardiovascular;  Laterality: N/A;  . SUBMUCOSAL TATTOO INJECTION  03/02/2020   Procedure: SUBMUCOSAL TATTOO INJECTION;  Surgeon: Yetta Flock, MD;  Location: [facility] ENDOSCOPY;  Service: Gastroenterology;;  . Lia Foyer TATTOO INJECTION  05/31/2020   Procedure: SUBMUCOSAL TATTOO INJECTION;  Surgeon: Irene Shipper, MD;  Location: [place];  Service: Endoscopy;;  . TUBAL LIGATION    . XI ROBOTIC ASSISTED LOWER ANTERIOR RESECTION N/A 05/22/2018   Procedure: XI ROBOTIC ASSISTED SIGMOID COLOECTOMY MOBILIZATION OF SPLENIC FLEXURE, FIREFLY ASSESSMENT OF PERFUSION;  Surgeon: Michael Boston, MD;  Location: [facility] ORS;  Service: General;  Laterality: N/A;  ERAS PATHWAY  Family History  Problem Relation Age of Onset  . Asthma Father   . Heart attack Father   . Asthma Sister   . Lung cancer Sister   . Heart attack Mother   . Stroke Brother   . Colon cancer Neg Hx   . Esophageal cancer Neg Hx   . Pancreatic cancer Neg Hx   . Stomach cancer Neg Hx   . Liver disease Neg Hx     Social History   Socioeconomic History  . Marital status: Divorced    Spouse name: Not on file  . Number of children: 1  . Years of education: 22  . Highest education level: Not on file  Occupational History  . Not on file  Tobacco Use  . Smoking status: Never Smoker  . Smokeless tobacco: Never Used  Vaping Use  . Vaping Use: Never used  Substance and Sexual  Activity  . Alcohol use: Yes    Comment: 01/03/2018 "couple drinks/month"  . Drug use: Yes    Types: Marijuana    Comment: VERY INTERMITTENT   . Sexual activity: Not Currently  Other Topics Concern  . Not on file  Social History Narrative   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      Home Situation: lives alone      Spiritual Beliefs: Christian - [employer]      Lifestyle: no regular exercise; poor diet      She is divorced at least one adult child   Never smoker    rare alcohol to occasional at best    no substance abuse         Social Determinants of Health   Financial Resource Strain: Low Risk   . Difficulty of Paying Living Expenses: Not hard at all  Food Insecurity: No Food Insecurity  . Worried About Charity fundraiser in the Last Year: Never true  . Ran Out of Food in the Last Year: Never true  Transportation Needs: No Transportation Needs  . Lack of Transportation (Medical): No  . Lack of Transportation (Non-Medical): No  Physical Activity: Not on file  Stress: Not on file  Social Connections: Not on file  Intimate Partner Violence: Not on file    No Known Allergies  Current Outpatient Medications  Medication Sig Dispense Refill  . allopurinol (ZYLOPRIM) 100 MG tablet Take 1 tablet (100 mg total) by mouth daily. 30 tablet 0  . carvedilol (COREG) 3.125 MG tablet Take 1 tablet (3.125 mg total) by mouth 2 (two) times daily with a meal. 60 tablet 5  . cromolyn (OPTICROM) 4 % ophthalmic solution Place 1 drop into both eyes 2 (two) times daily.    . ferrous sulfate 325 (65 FE) MG tablet Take 1 tablet (325 mg total) by mouth daily. 90 tablet 3  . gabapentin (NEURONTIN) 300 MG capsule Take 1 capsule (300 mg total) by mouth daily. 180 capsule 2  . isosorbide-hydrALAZINE (BIDIL) 20-37.5 MG tablet Take 0.5 tablets by mouth 3 (three) times daily. 30 tablet 5  . levothyroxine (SYNTHROID) 137 MCG tablet Take 1 tablet (137 mcg total) by mouth daily before breakfast. 90  tablet 3  . nitroGLYCERIN (NITROSTAT) 0.4 MG SL tablet Place 0.4 mg under the tongue every 5 (five) minutes x 3 doses as needed for chest pain.     . pantoprazole (PROTONIX) 40 MG tablet Take 1 tablet (40 mg total) by mouth daily. 30 tablet 11  . traZODone (DESYREL) 50 MG tablet Take 50 mg  by mouth at bedtime.    [name] amiodarone (PACERONE) 200 MG tablet Take 1 tablet (200 mg total) by mouth daily. 90 tablet 3   No current facility-administered medications for this visit.    REVIEW OF SYSTEMS:  [X]  denotes positive finding, [ ]  denotes negative finding Cardiac  Comments:  Chest pain or chest pressure:    Shortness of breath upon exertion:    Short of breath when lying flat:    Irregular heart rhythm:        Vascular    Pain in calf, thigh, or hip brought on by ambulation:    Pain in feet at night that wakes you up from your sleep:     Blood clot in your veins:    Leg swelling:         Pulmonary    Oxygen at home:    Productive cough:     Wheezing:         Neurologic    Sudden weakness in arms or legs:     Sudden numbness in arms or legs:     Sudden onset of difficulty speaking or slurred speech:    Temporary loss of vision in one eye:     Problems with dizziness:         Gastrointestinal    Blood in stool:     Vomited blood:         Genitourinary    Burning when urinating:     Blood in urine:        Psychiatric    Major depression:         Hematologic    Bleeding problems:    Problems with blood clotting too easily:        Skin    Rashes or ulcers:        Constitutional    Fever or chills:      PHYSICAL EXAM  Vitals:   07/27/20 1111  BP: 116/63  Pulse: 60  Resp: 20  Temp: (!) 97.3 F (36.3 C)  SpO2: 100%  Weight: 124 lb (56.2 kg)  Height: 4\' 11"  (1.499 m)   Constitutional: well appearing. no distress. Appears well nourished.  Neurologic: CN intact. no focal findings. no sensory loss. Psychiatric: Mood and affect symmetric and appropriate. Eyes: No  icterus. No conjunctival pallor. Ears, nose, throat: mucous membranes moist. Midline trachea.  Cardiac: regular rate and rhythm.  Respiratory: unlabored. Abdominal: soft, non-tender, non-distended.  Peripheral vascular:  2+ radial pulses bilaterally  RIJ TDC in place Extremity: No edema. No cyanosis. No pallor.  Skin: No gangrene. No ulceration.  Lymphatic: No Stemmer's sign. No palpable lymphadenopathy.   PERTINENT LABORATORY AND RADIOLOGIC DATA  Most recent CBC CBC Latest Ref Rng & Units 07/12/2020 07/10/2020 07/08/2020  WBC 4.0 - 10.5 K/uL 6.4 7.4 6.0  Hemoglobin 12.0 - 15.0 g/dL 10.8(L) 11.5(L) 11.6(L)  Hematocrit 36.0 - 46.0 % 34.7(L) 35.4(L) 35.9(L)  Platelets 150 - 400 K/uL 125(L) 169 155     Most recent CMP CMP Latest Ref Rng & Units 07/12/2020 07/11/2020 07/10/2020  Glucose 70 - 99 mg/dL 102(H) 113(H) 86  BUN 8 - 23 mg/dL 32(H) 18 32(H)  Creatinine 0.44 - 1.00 mg/dL 3.73(H) 2.60(H) 4.61(H)  Sodium 135 - 145 mmol/L 128(L) 132(L) 126(L)  Potassium 3.5 - 5.1 mmol/L 3.8 4.0 5.4(H)  Chloride 98 - 111 mmol/L 93(L) 94(L) 91(L)  CO2 22 - 32 mmol/L 21(L) 27 25  Calcium 8.9 - 10.3 mg/dL 9.6 9.1 10.0  Total  Protein 6.5 - 8.1 g/dL - - -  Total Bilirubin 0.3 - 1.2 mg/dL - - -  Alkaline Phos 38 - 126 U/L - - -  AST 15 - 41 U/L - - -  ALT 0 - 44 U/L - - -    Renal function Estimated Creatinine Clearance: 10.4 mL/min (A) (by C-G formula based on SCr of 3.73 mg/dL (H)).  Hgb A1c MFr Bld (% of total Hgb)  Date Value  02/04/2020 6.1 (H)    LDL Cholesterol  Date Value Ref Range Status  07/23/2019 33 0 - 99 mg/dL Final      Morgan Aline. Stanford Breed, MD [facility] Phone Number: [phone] 07/27/2020 12:49 PM

## 2020-07-29 ENCOUNTER — Telehealth: Payer: Self-pay

## 2020-07-29 ENCOUNTER — Telehealth (INDEPENDENT_AMBULATORY_CARE_PROVIDER_SITE_OTHER): Payer: Medicare Other | Admitting: Internal Medicine

## 2020-07-29 ENCOUNTER — Other Ambulatory Visit: Payer: Self-pay

## 2020-07-29 VITALS — Ht 59.0 in | Wt 125.0 lb

## 2020-07-29 DIAGNOSIS — I5042 Chronic combined systolic (congestive) and diastolic (congestive) heart failure: Secondary | ICD-10-CM | POA: Diagnosis not present

## 2020-07-29 DIAGNOSIS — I480 Paroxysmal atrial fibrillation: Secondary | ICD-10-CM

## 2020-07-29 DIAGNOSIS — Z95810 Presence of automatic (implantable) cardiac defibrillator: Secondary | ICD-10-CM

## 2020-07-29 DIAGNOSIS — I428 Other cardiomyopathies: Secondary | ICD-10-CM

## 2020-07-29 NOTE — Progress Notes (Signed)
Electrophysiology TeleHealth Note   Due to national recommendations of social distancing due to COVID 19, an audio/video telehealth visit is felt to be most appropriate for this patient at this time.  See MyChart message from today for the patient's consent to telehealth for [facility].   Date:  07/29/2020   ID:  Morgan Clay, DOB 1945/07/23, MRN [id number]  Location: patient's home  Provider location: [address] [place]  Evaluation Performed: Follow-up visit  PCP:  Billie Ruddy, MD  Cardiologist:   DM Electrophysiologist:  SK   Chief Complaint:  weakness  History of Present Illness:    Morgan Clay is a 75 y.o. female who presents via audio/video conferencing for a telehealth visit today.  Since last being seen in our clinic, the patient reports interval hospitalization for GI bleeding 1/22 w hgb of 5.5 2/2 AVM distal small bowel>> laser and afib for which she in on amiodarone these Cx by acute on chronic renal insufficiency progressing to ESRD and was to be referred for [facility], she reports doing pretty well  Tolerating HD, chronci but improving DOE, no chest pain no palpitations  Date Cr K Hgb LFTs TSH  1/19 1.76 4.3 14.7   0.33  4/19  1.96 4.2 9.7    3/21 1.89 3.6 10.0        DATE TEST EF   4/15 Echo 55-60 %   9/17 Echo 45-50 % LAE (52/3.1/35)  10/18 Echo  45-50% LVH mod  4/19 Echo  55-65%   3/20 PYP  equivocal  9/20 Echo  45-50 AI mod  3/22 Echo  50-55% D shaped septum TR severe RVE mod  2/22 RHC  PA 59/24; RA 21 PCWP 26     Thromboembolic risk factors ( age -4, HTN-1  DM-1, Gender-1 ) for a CHADSVASc Score of 4   Past Medical History:  Diagnosis Date  . A-fib ([place])    on Eliquis  . AICD (automatic cardioverter/defibrillator) present 10/06/2015  . Anemia   . Angiodysplasia of small intestine ([place]) 06/10/2020   Ileum - seen on capsule endoscopy 05/2020 - ablated at colonoscopy  . ARF (acute renal failure) ([place])    . Arthritis    "qwhere" (01/03/2018)  . Asthma    reports mild asthma since childhood - had COPD on dx list from prior PCP  . Chronic bronchitis ([place])    "get it most years; not this past year though" (01/03/2018)  . Chronic kidney disease    "? stage;  followed by [facility]; [name]" (01/03/2018)  . Chronic systolic congestive heart failure ([place])   . COVID-19 05/03/2020  . DEPRESSION 12/17/2009   Annotation: PHQ-9 score = 14 done on 12/17/2009 Qualifier: Diagnosis of  By: Hassell Done FNP, Tori Milks    . Diabetes mellitus without complication (HCC)    DIET CONTROLLED   . Diverticulosis   . Enteritis   . FIBROIDS, UTERUS 03/05/2008  . GI bleeding 01/03/2018  . Glaucoma   . Gout   . Hepatitis C    HEPATITIS C - s/p treatment with Harvoni, saw hepatology, Dawn Drazek  . History of blood transfusion ~ 11/2017  . Hyperlipemia 12/06/2012  . HYPERTENSION, BENIGN 04/24/2007  . Hypothyroidism   . LBBB (left bundle branch block)   . Lower GI bleeding    "been dealing w/it since 07/2017" (01/03/2018)  . Nonischemic cardiomyopathy ([place])   . OBESITY 05/27/2009  . OBSTRUCTIVE SLEEP APNEA 11/14/2007   no CPAP  .  OSTEOPENIA 09/30/2008  . Pain and swelling of left upper extremity 08/08/2017  . Personal history of other infectious and parasitic disease    Hepatitis B  . Pneumonia    "several times" (01/03/2018)  . Pulmonary edema, acute ([place]) 08/09/2017    Past Surgical History:  Procedure Laterality Date  . APPENDECTOMY    . CARDIAC CATHETERIZATION    . CARDIOVERSION N/A 12/14/2014   Procedure: CARDIOVERSION;  Surgeon: Dorothy Spark, MD;  Location: [facility] ENDOSCOPY;  Service: Cardiovascular;  Laterality: N/A;  . CARDIOVERSION N/A 02/26/2017   Procedure: CARDIOVERSION;  Surgeon: Evans Lance, MD;  Location: [place] CV LAB;  Service: Cardiovascular;  Laterality: N/A;  . CARDIOVERSION N/A 07/24/2017   Procedure: CARDIOVERSION;  Surgeon: Thayer Headings, MD;  Location: [facility] ENDOSCOPY;  Service:  Cardiovascular;  Laterality: N/A;  . CARDIOVERSION N/A 02/12/2020   Procedure: CARDIOVERSION;  Surgeon: [name], MD;  Location: [facility] ENDOSCOPY;  Service: Cardiovascular;  Laterality: N/A;  . COLONOSCOPY N/A 11/08/2017   Procedure: COLONOSCOPY;  Surgeon: Milus Banister, MD;  Location: [facility] ENDOSCOPY;  Service: Endoscopy;  Laterality: N/A;  . COLONOSCOPY WITH PROPOFOL N/A 05/31/2020   Procedure: COLONOSCOPY WITH PROPOFOL;  Surgeon: Irene Shipper, MD;  Location: [facility] ENDOSCOPY;  Service: Endoscopy;  Laterality: N/A;  . DILATION AND CURETTAGE OF UTERUS    . ENTEROSCOPY N/A 03/02/2020   Procedure: ENTEROSCOPY;  Surgeon: Yetta Flock, MD;  Location: [facility] ENDOSCOPY;  Service: Gastroenterology;  Laterality: N/A;  . EP IMPLANTABLE DEVICE N/A 10/06/2015   Procedure: BIV ICD Generator Changeout;  Surgeon: [name], MD;  Location: [place] CV LAB;  Service: Cardiovascular;  Laterality: N/A;  . ESOPHAGOGASTRODUODENOSCOPY N/A 10/26/2017   Procedure: ESOPHAGOGASTRODUODENOSCOPY (EGD);  Surgeon: Ladene Artist, MD;  Location: [facility] END[facility];  Service: Endoscopy;  Laterality: N/A;  . ESOPHAGOGASTRODUODENOSCOPY (EGD) WITH PROPOFOL N/A 11/07/2017   Procedure: ESOPHAGOGASTRODUODENOSCOPY (EGD) WITH PROPOFOL;  Surgeon: Milus Banister, MD;  Location: [facility] ENDOSCOPY;  Service: Endoscopy;  Laterality: N/A;  . ESOPHAGOGASTRODUODENOSCOPY (EGD) WITH PROPOFOL N/A 05/29/2020   Procedure: ESOPHAGOGASTRODUODENOSCOPY (EGD) WITH PROPOFOL;  Surgeon: Doran Stabler, MD;  Location: [place];  Service: Gastroenterology;  Laterality: N/A;  . GIVENS CAPSULE STUDY N/A 05/19/2020   Procedure: GIVENS CAPSULE STUDY;  Surgeon: [name], MD;  Location: [place];  Service: Endoscopy;  Laterality: N/A;  .adm for obs since pacemaker, PA wil enter order and see pt  . GIVENS CAPSULE STUDY N/A 05/29/2020   Procedure: GIVENS CAPSULE STUDY;  Surgeon: Doran Stabler, MD;  Location: [place];  Service:  Gastroenterology;  Laterality: N/A;  . HOT HEMOSTASIS N/A 10/26/2017   Procedure: HOT HEMOSTASIS (ARGON PLASMA COAGULATION/BICAP);  Surgeon: Ladene Artist, MD;  Location: [facility] ENDOS[facility];  Service: Endoscopy;  Laterality: N/A;  . HOT HEMOSTASIS N/A 03/02/2020   Procedure: HOT HEMOSTASIS (ARGON PLASMA COAGULATION/BICAP);  Surgeon: Yetta Flock, MD;  Location: [facility] ENDOSCOPY;  Service: Gastroenterology;  Laterality: N/A;  . HOT HEMOSTASIS N/A 05/31/2020   Procedure: HOT HEMOSTASIS (ARGON PLASMA COAGULATION/BICAP);  Surgeon: Irene Shipper, MD;  Location: [facility] ENDOSCOPY;  Service: Endoscopy;  Laterality: N/A;  . INSERT / REPLACE / REMOVE PACEMAKER  ?2008  . IR PERC TUN PERIT CATH WO PORT S&I Dartha Lodge  07/05/2020  . POLYPECTOMY  11/08/2017   Procedure: POLYPECTOMY;  Surgeon: Milus Banister, MD;  Location: [facility] ENDOSCOPY;  Service: Endoscopy;;  . PROCTOSCOPY N/A 05/22/2018   Procedure: RIGID PROCTOSCOPY;  Surgeon: Michael Boston, MD;  Location: [facility] ORS;  Service: General;  Laterality: N/A;  . RIGHT HEART CATH N/A 02/13/2018   Procedure: RIGHT HEART CATH;  Surgeon: [name], MD;  Location: [place] CV LAB;  Service: Cardiovascular;  Laterality: N/A;  . RIGHT HEART CATH N/A 06/23/2020   Procedure: RIGHT HEART CATH;  Surgeon: [name], MD;  Location: [place] CV LAB;  Service: Cardiovascular;  Laterality: N/A;  . RIGHT/LEFT HEART CATH AND CORONARY ANGIOGRAPHY N/A 12/11/2019   Procedure: RIGHT/LEFT HEART CATH AND CORONARY ANGIOGRAPHY;  Surgeon: [name], MD;  Location: [place] CV LAB;  Service: Cardiovascular;  Laterality: N/A;  . SUBMUCOSAL TATTOO INJECTION  03/02/2020   Procedure: SUBMUCOSAL TATTOO INJECTION;  Surgeon: Yetta Flock, MD;  Location: [facility] ENDOSCOPY;  Service: Gastroenterology;;  . Lia Foyer TATTOO INJECTION  05/31/2020   Procedure: SUBMUCOSAL TATTOO INJECTION;  Surgeon: Irene Shipper, MD;  Location: [place];  Service: Endoscopy;;  . TUBAL LIGATION    .  XI ROBOTIC ASSISTED LOWER ANTERIOR RESECTION N/A 05/22/2018   Procedure: XI ROBOTIC ASSISTED SIGMOID COLOECTOMY MOBILIZATION OF SPLENIC FLEXURE, FIREFLY ASSESSMENT OF PERFUSION;  Surgeon: Michael Boston, MD;  Location: [facility] ORS;  Service: General;  Laterality: N/A;  ERAS PATHWAY    Current Outpatient Medications  Medication Sig Dispense Refill  . acetaminophen (TYLENOL) 325 MG tablet Take 325 mg by mouth every 6 (six) hours as needed for moderate pain or headache.    . allopurinol (ZYLOPRIM) 100 MG tablet Take 1 tablet (100 mg total) by mouth daily. 30 tablet 0  . amiodarone (PACERONE) 200 MG tablet Take 1 tablet (200 mg total) by mouth daily. 90 tablet 3  . carvedilol (COREG) 3.125 MG tablet Take 1 tablet (3.125 mg total) by mouth 2 (two) times daily with a meal. 60 tablet 5  . cromolyn (OPTICROM) 4 % ophthalmic solution Place 1 drop into both eyes 2 (two) times daily.    [name] gabapentin (NEURONTIN) 300 MG capsule Take 1 capsule (300 mg total) by mouth daily. 180 capsule 2  . isosorbide-hydrALAZINE (BIDIL) 20-37.5 MG tablet Take 0.5 tablets by mouth 3 (three) times daily. 30 tablet 5  . levothyroxine (SYNTHROID) 137 MCG tablet Take 1 tablet (137 mcg total) by mouth daily before breakfast. 90 tablet 3  . MIRALAX 17 g packet Take 1 packet by mouth daily as needed for constipation.    . nitroGLYCERIN (NITROSTAT) 0.4 MG SL tablet Place 0.4 mg under the tongue every 5 (five) minutes x 3 doses as needed for chest pain.     . pantoprazole (PROTONIX) 40 MG tablet Take 1 tablet (40 mg total) by mouth daily. 30 tablet 11  . traZODone (DESYREL) 50 MG tablet Take 50 mg by mouth at bedtime.    . ferrous sulfate 325 (65 FE) MG tablet Take 1 tablet (325 mg total) by mouth daily. (Patient not taking: No sig reported) 90 tablet 3   No current facility-administered medications for this visit.    Allergies:   Patient has no known allergies.   Social History:  The patient  reports that she has never smoked. She has  never used smokeless tobacco. She reports current alcohol use. She reports current drug use. Drug: Marijuana.   Family History:  The patient's   family history includes Asthma in her father and sister; Heart attack in her father and mother; Lung cancer in her sister; Stroke in her brother.   ROS:  Please see the history of present illness.   All other  systems are personally reviewed and negative.    Exam:    Vital Signs:  Ht 4\' 11"  (1.499 m)   Wt 125 lb (56.7 kg)   BMI 25.25 kg/m        Labs/Other Tests and Data Reviewed:    Recent Labs: 06/23/2020: ALT 11; B Natriuretic Peptide 1,099.2; TSH 3.588; TSH 3.392 07/12/2020: Hemoglobin 10.8; Platelets 125 07/27/2020: BUN 20; Creatinine, Ser 4.13; Magnesium 2.4; Potassium 3.8; Sodium 132   Wt Readings from Last 3 Encounters:  07/29/20 125 lb (56.7 kg)  07/27/20 129 lb 9.6 oz (58.8 kg)  07/27/20 124 lb (56.2 kg)     Other studies personally reviewed: Additional studies/ records that were reviewed today include: As above   Last device remote is reviewed from [place] PDF dated 12/21 which reveals normal device function,   arrhythmias - interval atrial fibrillation   ASSESSMENT & PLAN:   DiastolicSystolic heart failure Acute/ chronic  Nonischemic cardiomyopathy  High Risk Medication Surveillance  Hypertension    CRT-D- [employer]tr[employer]    Diaphragmatic stimulation   Atrial Flutter-atypical & slow  ESRD on RRT   I have seen Jeri Lager in the office today.  She was referred by [name], MD   for consideration for Left Atrial Appendage Closure with the Watchman Device for management of stroke risk. Based upon past history, it has been determined that she is a poor candidate for long-term oral anticoagulation.  However, she may be tolerant of short-term treatment with an anticoagulant as necessary.  A shared decision has been made utilizing the [employer] of Cardiology shared decision tool to undergo  Left Atrial Appendage Closure with Watchman device at this time.  Particularly in light of ESRD with high risk of bleeding and stroke, and with her hx of recurrent severe GI bleeding  She will need short term anticoagulation and perhaps would have to be considered for very brief antiplatelet therapy         Labs/ tests ordered today include:   No orders of the defined types were placed in this encounter.     Patient Risk:  after full review of this patients clinical status, I feel that they are at moderate  risk at this time.  Today, I have spent 8* minutes with the patient with telehealth technology discussing the above.   Signed, Virl Axe, MD  07/29/2020 3:49 PM     [facility] [address] [place] [address] [phone] (office) [phone] (fax)

## 2020-07-29 NOTE — Telephone Encounter (Signed)
  Patient Consent for Virtual Visit         Morgan Clay has provided verbal consent on 07/29/2020 for a virtual visit (video or telephone).   CONSENT FOR VIRTUAL VISIT FOR:  Morgan Clay  By participating in this virtual visit I agree to the following:  I hereby voluntarily request, consent and authorize [facility] and its employed or contracted physicians, physician assistants, nurse practitioners or other licensed health care professionals (the Practitioner), to provide me with telemedicine health care services (the "Services") as deemed necessary by the treating Practitioner. I acknowledge and consent to receive the Services by the Practitioner via telemedicine. I understand that the telemedicine visit will involve communicating with the Practitioner through live audiovisual communication technology and the disclosure of certain medical information by electronic transmission. I acknowledge that I have been given the opportunity to request an in-person assessment or other available alternative prior to the telemedicine visit and am voluntarily participating in the telemedicine visit.  I understand that I have the right to withhold or withdraw my consent to the use of telemedicine in the course of my care at any time, without affecting my right to future care or treatment, and that the Practitioner or I may terminate the telemedicine visit at any time. I understand that I have the right to inspect all information obtained and/or recorded in the course of the telemedicine visit and may receive copies of available information for a reasonable fee.  I understand that some of the potential risks of receiving the Services via telemedicine include:  [name] Delay or interruption in medical evaluation due to technological equipment failure or disruption; . Information transmitted may not be sufficient (e.g. poor resolution of images) to allow for appropriate medical decision making by the  Practitioner; and/or  . In rare instances, security protocols could fail, causing a breach of personal health information.  Furthermore, I acknowledge that it is my responsibility to provide information about my medical history, conditions and care that is complete and accurate to the best of my ability. I acknowledge that Practitioner's advice, recommendations, and/or decision may be based on factors not within their control, such as incomplete or inaccurate data provided by me or distortions of diagnostic images or specimens that may result from electronic transmissions. I understand that the practice of medicine is not an exact science and that Practitioner makes no warranties or guarantees regarding treatment outcomes. I acknowledge that a copy of this consent can be made available to me via my patient portal ([facility]), or I can request a printed copy by calling the office of [facility].    I understand that my insurance will be billed for this visit.   I have read or had this consent read to me. . I understand the contents of this consent, which adequately explains the benefits and risks of the Services being provided via telemedicine.  . I have been provided ample opportunity to ask questions regarding this consent and the Services and have had my questions answered to my satisfaction. . I give my informed consent for the services to be provided through the use of telemedicine in my medical care

## 2020-08-02 ENCOUNTER — Ambulatory Visit: Payer: Medicare Other | Admitting: Medical

## 2020-08-03 ENCOUNTER — Other Ambulatory Visit: Payer: Self-pay

## 2020-08-03 ENCOUNTER — Other Ambulatory Visit: Payer: Self-pay | Admitting: Cardiology

## 2020-08-03 ENCOUNTER — Other Ambulatory Visit: Payer: Medicare Other | Admitting: Student

## 2020-08-03 ENCOUNTER — Other Ambulatory Visit (HOSPITAL_COMMUNITY)
Admission: RE | Admit: 2020-08-03 | Discharge: 2020-08-03 | Disposition: A | Discharge status: Home / Self Care | Payer: Medicare Other | Source: Ambulatory Visit | Attending: Vascular Surgery | Admitting: Vascular Surgery

## 2020-08-03 ENCOUNTER — Ambulatory Visit (INDEPENDENT_AMBULATORY_CARE_PROVIDER_SITE_OTHER): Payer: Medicare Other

## 2020-08-03 DIAGNOSIS — Z515 Encounter for palliative care: Secondary | ICD-10-CM

## 2020-08-03 DIAGNOSIS — N186 End stage renal disease: Secondary | ICD-10-CM

## 2020-08-03 DIAGNOSIS — Z20822 Contact with and (suspected) exposure to covid-19: Secondary | ICD-10-CM | POA: Insufficient documentation

## 2020-08-03 DIAGNOSIS — Z01812 Encounter for preprocedural laboratory examination: Secondary | ICD-10-CM | POA: Diagnosis present

## 2020-08-03 DIAGNOSIS — I5030 Unspecified diastolic (congestive) heart failure: Secondary | ICD-10-CM

## 2020-08-03 DIAGNOSIS — I428 Other cardiomyopathies: Secondary | ICD-10-CM

## 2020-08-03 DIAGNOSIS — I480 Paroxysmal atrial fibrillation: Secondary | ICD-10-CM

## 2020-08-03 LAB — CUP PACEART REMOTE DEVICE CHECK
Battery Remaining Longevity: 21 mo
Battery Voltage: 2.92 V
Brady Statistic AP VP Percent: 29.92 %
Brady Statistic AP VS Percent: 0.01 %
Brady Statistic AS VP Percent: 70.07 %
Brady Statistic AS VS Percent: 0 %
Brady Statistic RA Percent Paced: 29.92 %
Brady Statistic RV Percent Paced: 99.98 %
Date Time Interrogation Session: 20220329012404
HighPow Impedance: 50 Ohm
HighPow Impedance: 78 Ohm
Implantable Lead Implant Date: 20080818
Implantable Lead Implant Date: 20080818
Implantable Lead Implant Date: 20080818
Implantable Lead Location: 753858
Implantable Lead Location: 753859
Implantable Lead Location: 753860
Implantable Lead Model: 4193
Implantable Lead Model: 5076
Implantable Lead Model: 6947
Implantable Pulse Generator Implant Date: 20170531
Lead Channel Impedance Value: 304 Ohm
Lead Channel Impedance Value: 380 Ohm
Lead Channel Impedance Value: 4047 Ohm
Lead Channel Impedance Value: 4047 Ohm
Lead Channel Impedance Value: 437 Ohm
Lead Channel Impedance Value: 437 Ohm
Lead Channel Pacing Threshold Amplitude: 0.75 V
Lead Channel Pacing Threshold Amplitude: 1.375 V
Lead Channel Pacing Threshold Amplitude: 1.5 V
Lead Channel Pacing Threshold Pulse Width: 0.4 ms
Lead Channel Pacing Threshold Pulse Width: 0.4 ms
Lead Channel Pacing Threshold Pulse Width: 0.4 ms
Lead Channel Sensing Intrinsic Amplitude: 14.125 mV
Lead Channel Sensing Intrinsic Amplitude: 14.125 mV
Lead Channel Sensing Intrinsic Amplitude: 3.375 mV
Lead Channel Sensing Intrinsic Amplitude: 3.375 mV
Lead Channel Setting Pacing Amplitude: 1.5 V
Lead Channel Setting Pacing Amplitude: 2 V
Lead Channel Setting Pacing Amplitude: 2.75 V
Lead Channel Setting Pacing Pulse Width: 0.4 ms
Lead Channel Setting Pacing Pulse Width: 0.4 ms
Lead Channel Setting Sensing Sensitivity: 0.3 mV

## 2020-08-03 LAB — SARS CORONAVIRUS 2 (TAT 6-24 HRS): SARS Coronavirus 2: NEGATIVE

## 2020-08-03 NOTE — Research (Signed)
  Avoiding Treatment in the Hospital with Furoscix for the Management of Congestion in Heart Failure - A Pilot Study  AT [facility] Pilot   Visit Day: Day 0    Subject Number: [id number]    Date: February 11th, 2022    Respiration Rate:  (bpm) 18 ReDS:  (%) 39

## 2020-08-03 NOTE — Progress Notes (Signed)
Morgan Clay Consult Note Telephone: [phone]  Fax: [phone]  PATIENT NAME: Morgan Clay [address] [place] [address] [phone] (home)  DOB: [birth year] MRN: [id number]  PRIMARY CARE PROVIDER:    Billie Ruddy, MD,  [place] [address] [phone]  REFERRING PROVIDER:   Billie Clay, [address] [place],  [place] [address] [phone]  RESPONSIBLE PARTY:   Extended Emergency Contact Information Primary Emergency Contact: Morgan Clay,Morgan Clay Address: [address]          [place], [place] [address] Johnnette Litter of [place] Phone: [phone] Mobile Phone: [phone] Relation: Daughter  I met face to face with patient in the home.    ASSESSMENT AND RECOMMENDATIONS:   Advance Care Planning: Visit at the request of Dr. Volanda Clay for palliative consult. Visit consisted of building trust and discussions on Palliative care medicine as specialized medical care for people living with serious illness, aimed at facilitating improved quality of life through symptoms relief, assisting with advance care planning and establishing goals of care. Education provided on Palliative Medicine vs. Hospice services. Palliative care will continue to provide support to patient, family and the medical team.  Goal of care: To continue hemodialysis.  Directives: MOST form reviewed; no changes.  Symptom Management:   ESRD-patient currently receiving hemodialysis on MWF. She is scheduled to have permament cath placement. Follow up with nephrology as scheduled.  Acute on chronic diastolic heart failure-patient presented with volume overload and renal failure; most recent echo showed improved EF of 55-60%. She was started on hemodialysis during hospitalization, tolerating well and symptoms improved. Continue bidil and coreg as directed. Follow up with cardiology as scheduled.    Insomnia-patient currently has trazodone 45m QHS prn; recommendation trazodone as directed, may also trial melatonin 563mQHS prn.   Follow up Palliative Care Visit: Palliative care will continue to follow for complex decision making and symptom management. Phone call in 4 weeks or prn.  Family /Caregiver/Community Supports: Palliative Medicine will continue to provide support.    I spent 45 minutes providing this consultation, from 10:00am to 10:45am. Time includes time spent with patient/family, chart review, provider coordination, and documentation. More than 50% of the time in this consultation was spent counseling and coordinating communication.   CHIEF COMPLAINT: Palliative Medicine initial consult.   History obtained from review of EMR, discussion with primary team. Records reviewed and summarized below.  HISTORY OF PRESENT ILLNESS:  GeBernadine Melecios a [age].o. year old female with multiple medical problems including ESRD, receiving HD Monday, Wednesday, Friday; nonischemic cardiomyopathy, acute on chronic diastolic heart failure, atrial fibrillation, atrial flutter, anemia, hypertension, GI bleed. Patient recently hospitalized 2/16-07/12/2020. Palliative Care was asked to follow this patient by consultation request of BaBillie RuddyMD to help address advance care planning and goals of care. This is an initial visit.  Patient resides at home with her daughter. She states HD is going well; she is to have permanent dialysis catheter placed soon. She denies fatigue, pain, chest pain, palpitations, shortness of breath at rest, nausea, constipation. She endorses a fair appetite. She does report some insomnia; she takes trazodone with little effect.   CODE STATUS: Full Code  PPS: 60%  HOSPICE ELIGIBILITY/DIAGNOSIS: TBD  ROS   General: NAD EYES: denies vision change ENMT: denies dysphagia Cardiovascular: denies chest pain Pulmonary: denies cough, denies increased SOB Abdomen:  endorses fair appetite, denies constipation GU: denies dysuria, voiding less  than normal amounts MSK: no recent falls reported Skin: denies rashes or wounds Neurological: endorses weakness, denies pain Psych: Endorses positive mood Heme/lymph/immuno: denies bruises or abnormal bleeding   Physical Exam: Constitutional: NAD General: A & O x 4; well groomed appearance EYES: anicteric sclera, lids intact, no discharge  ENMT: intact hearing,oral mucous membranes moist CV:  no LE edema Pulmonary: LCTA, no increased work of breathing, no cough, sats 97% on room air Abdomen: bowel sounds normoactive x 4 GU: deferred MSK: moves all extremities; ambulatory without assistive device Skin: warm and dry, no rashes or wounds on visible skin Neuro: Generalized weakness Psych: pleasant, non-anxious affect today Hem/lymph/immuno: no widespread bruising   PAST MEDICAL HISTORY:  Past Medical History:  Diagnosis Date  . A-fib ([place])    on Eliquis  . AICD (automatic cardioverter/defibrillator) present 10/06/2015  . Anemia   . Angiodysplasia of small intestine ([place]) 06/10/2020   Ileum - seen on capsule endoscopy 05/2020 - ablated at colonoscopy  . ARF (acute renal failure) ([place])   . Arthritis    "qwhere" (01/03/2018)  . Asthma    reports mild asthma since childhood - had COPD on dx list from prior PCP  . Chronic bronchitis ([place])    "get it most years; not this past year though" (01/03/2018)  . Chronic kidney disease    "? stage;  followed by [facility]; [name]" (01/03/2018)  . Chronic systolic congestive heart failure ([place])   . COVID-19 05/03/2020  . DEPRESSION 12/17/2009   Annotation: PHQ-9 score = 14 done on 12/17/2009 Qualifier: Diagnosis of  By: Hassell Done FNP, Tori Milks    . Diabetes mellitus without complication ([facility])    DIET CONTROLLED   . Diverticulosis   . Enteritis   . FIBROIDS, UTERUS 03/05/2008  . GI bleeding 01/03/2018  . Glaucoma   . Gout   . Hepatitis C    HEPATITIS C - s/p  treatment with Harvoni, saw hepatology, Dawn Drazek  . History of blood transfusion ~ 11/2017  . Hyperlipemia 12/06/2012  . HYPERTENSION, BENIGN 04/24/2007  . Hypothyroidism   . LBBB (left bundle branch block)   . Lower GI bleeding    "been dealing w/it since 07/2017" (01/03/2018)  . Nonischemic cardiomyopathy ([name])   . OBESITY 05/27/2009  . OBSTRUCTIVE SLEEP APNEA 11/14/2007   no CPAP  . OSTEOPENIA 09/30/2008  . Pain and swelling of left upper extremity 08/08/2017  . Personal history of other infectious and parasitic disease    Hepatitis B  . Pneumonia    "several times" (01/03/2018)  . Pulmonary edema, acute ([place]) 08/09/2017    SOCIAL HX:  Social History   Tobacco Use  . Smoking status: Never Smoker  . Smokeless tobacco: Never Used  Substance Use Topics  . Alcohol use: Yes    Comment: 01/03/2018 "couple drinks/month"   FAMILY HX:  Family History  Problem Relation Age of Onset  . Asthma Father   . Heart attack Father   . Asthma Sister   . Lung cancer Sister   . Heart attack Mother   . Stroke Brother   . Colon cancer Neg Hx   . Esophageal cancer Neg Hx   . Pancreatic cancer Neg Hx   . Stomach cancer Neg Hx   . Liver disease Neg Hx     ALLERGIES: No Known Allergies   PERTINENT MEDICATIONS:  Outpatient Encounter Medications as of 08/03/2020  Medication Sig  . acetaminophen (TYLENOL) 325 MG tablet Take 325 mg by mouth every 6 (six)  hours as needed for moderate pain or headache.  . allopurinol (ZYLOPRIM) 100 MG tablet Take 1 tablet by mouth once daily  . amiodarone (PACERONE) 200 MG tablet Take 1 tablet (200 mg total) by mouth daily.  . carvedilol (COREG) 3.125 MG tablet Take 1 tablet (3.125 mg total) by mouth 2 (two) times daily with a meal.  . cromolyn (OPTICROM) 4 % ophthalmic solution Place 1 drop into both eyes 2 (two) times daily.  . ferrous sulfate 325 (65 FE) MG tablet Take 1 tablet (325 mg total) by mouth daily. (Patient not taking: No sig reported)  . gabapentin  (NEURONTIN) 300 MG capsule Take 1 capsule (300 mg total) by mouth daily.  . isosorbide-hydrALAZINE (BIDIL) 20-37.5 MG tablet Take 0.5 tablets by mouth 3 (three) times daily.  [name] levothyroxine (SYNTHROID) 137 MCG tablet Take 1 tablet (137 mcg total) by mouth daily before breakfast.  . MIRALAX 17 g packet Take 1 packet by mouth daily as needed for constipation.  . nitroGLYCERIN (NITROSTAT) 0.4 MG SL tablet Place 0.4 mg under the tongue every 5 (five) minutes x 3 doses as needed for chest pain.   . pantoprazole (PROTONIX) 40 MG tablet Take 1 tablet (40 mg total) by mouth daily.  . traZODone (DESYREL) 50 MG tablet Take 50 mg by mouth at bedtime.   No facility-administered encounter medications on file as of 08/03/2020.     Thank you for the opportunity to participate in the care of Ms. Morones. The palliative care team will continue to follow. Please call our office at [phone] if we can be of additional assistance.  Ezekiel Slocumb, NP

## 2020-08-04 ENCOUNTER — Encounter: Payer: Self-pay | Admitting: Internal Medicine

## 2020-08-04 ENCOUNTER — Other Ambulatory Visit: Payer: Self-pay

## 2020-08-04 ENCOUNTER — Encounter (HOSPITAL_COMMUNITY): Payer: Self-pay | Admitting: Vascular Surgery

## 2020-08-04 NOTE — Progress Notes (Signed)
PERIOPERATIVE PRESCRIPTION FOR IMPLANTED CARDIAC DEVICE PROGRAMMING  Patient Information: Name:  Morgan Clay  DOB:  08/28/1945  MRN:  [id number]    Planned Procedure:  LEFT UPPER EXTREMITY ARTERIOVENOUS (AV) FISTULA CREATION  Surgeon: Dr. Jamelle Haring  Date of Procedure: 08/05/20  Cautery will be used.  Position during surgery:    Please send documentation back to:  [facility] (Fax # [phone])   Device Information:  Clinic EP Physician:  Virl Axe, MD   Device Type:  Defibrillator Manufacturer and Phone #:  Medtronic: [id number] Pacemaker Dependent?:  Yes.   Date of Last Device Check:  08/03/2020 Normal Device Function?:  Yes.    Electrophysiologist's Recommendations:   Have magnet available.  Provide continuous ECG monitoring when magnet is used or reprogramming is to be performed.   Procedure may interfere with device function.  Magnet should be placed over device during procedure.   If patient is in prone position, will need to have industry present to reprogram device.  Per Device Clinic Standing Orders, Simone Curia, RN  12:42 PM 08/04/2020

## 2020-08-04 NOTE — Progress Notes (Signed)
PCP - Dr. Volanda Napoleon Cardiologist - Dr. Caryl Comes  Chest x-ray - 06/23/20 EKG - 06/23/20 Stress Test -  ECHO - 06/24/20 Cardiac Cath - 06/23/20  Sleep Study - yes CPAP - no    COVID TEST- 08/03/20  Anesthesia review: yes AICD - order requested, rep notified   -------------  SDW INSTRUCTIONS:  Your procedure is scheduled on 08/05/20. Please report to [facility] Main Entrance "A" at [email or web address].M., and check in at the Admitting office. Call this number if you have problems the morning of surgery: [phone]   Remember: Do not eat or drink after midnight the night before your surgery   Medications to take morning of surgery with a sip of water include: acetaminophen (TYLENOL)  If needed allopurinol (ZYLOPRIM)  amiodarone (PACERONE)  carvedilol (COREG) gabapentin (NEURONTIN)  isosorbide-hydrALAZINE (BIDIL) levothyroxine (SYNTHROID)  pantoprazole (PROTONIX)    As of today, STOP taking any Aspirin (unless otherwise instructed by your surgeon), Aleve, Naproxen, Ibuprofen, Motrin, Advil, Goody's, BC's, all herbal medications, fish oil, and all vitamins.    The Morning of Surgery Do not wear jewelry, make-up or nail polish. Do not wear lotions, powders, or perfumes, or deodorant Do not shave 48 hours prior to surgery.   Do not bring valuables to the hospital. [facility] is not responsible for any belongings or valuables. If you are a smoker, DO NOT Smoke 24 hours prior to surgery If you wear a CPAP at night please bring your mask the morning of surgery  Remember that you must have someone to transport you home after your surgery, and remain with you for 24 hours if you are discharged the same day. Please bring cases for contacts, glasses, hearing aids, dentures or bridgework because it cannot be worn into surgery.   Patients discharged the day of surgery will not be allowed to drive home.   Please shower the NIGHT BEFORE SURGERY and the MORNING OF SURGERY with DIAL Soap. Wear comfortable  clothes the morning of surgery. Oral Hygiene is also important to reduce your risk of infection.  Remember - BRUSH YOUR TEETH THE MORNING OF SURGERY WITH YOUR REGULAR TOOTHPASTE  Patient denies shortness of breath, fever, cough and chest pain.

## 2020-08-04 NOTE — Anesthesia Preprocedure Evaluation (Addendum)
Anesthesia Evaluation  Patient identified by MRN, date of birth, ID band Patient awake    Reviewed: Allergy & Precautions, NPO status , Patient's Chart, lab work & pertinent test results, reviewed documented beta blocker date and time   Airway Mallampati: II  TM Distance: >3 FB Neck ROM: Full    Dental  (+) Teeth Intact   Pulmonary asthma , sleep apnea , COPD,    Pulmonary exam normal        Cardiovascular hypertension, Pt. on medications and Pt. on home beta blockers + Peripheral Vascular Disease and +CHF  + dysrhythmias Atrial Fibrillation + Cardiac Defibrillator  Rhythm:Irregular Rate:Normal     Neuro/Psych Depression negative neurological ROS     GI/Hepatic GERD  Medicated,(+) Hepatitis -, CDiverticulitis    Endo/Other  diabetes, Type 2Hypothyroidism   Renal/GU ESRF and DialysisRenal disease  negative genitourinary   Musculoskeletal  (+) Arthritis , Osteoarthritis,    Abdominal (+)  Abdomen: soft. Bowel sounds: normal.  Peds  Hematology  (+) anemia ,   Anesthesia Other Findings   Reproductive/Obstetrics                            Anesthesia Physical Anesthesia Plan  ASA: III  Anesthesia Plan: MAC and Regional   Post-op Pain Management:    Induction: Intravenous  PONV Risk Score and Plan: 2 and Ondansetron, Propofol infusion, Midazolam and Treatment may vary due to age or medical condition  Airway Management Planned: Simple Face Mask, Natural Airway and Nasal Cannula  Additional Equipment: None  Intra-op Plan:   Post-operative Plan:   Informed Consent: I have reviewed the patients History and Physical, chart, labs and discussed the procedure including the risks, benefits and alternatives for the proposed anesthesia with the patient or authorized representative who has indicated his/her understanding and acceptance.     Dental advisory given  Plan Discussed with:  CRNA  Anesthesia Plan Comments: (PAT note written 08/04/2020 by Myra Gianotti, PA-C. Lab Results      Component                Value               Date                      WBC                      6.4                 07/12/2020                HGB                      11.6 (L)            08/05/2020                HCT                      34.0 (L)            08/05/2020                MCV                      88.5                07/12/2020  PLT                      125 (L)             07/12/2020           Lab Results      Component                Value               Date                      NA                       137                 08/05/2020                K                        3.8                 08/05/2020                CO2                      30                  07/27/2020                GLUCOSE                  89                  08/05/2020                BUN                      19                  08/05/2020                CREATININE               3.80 (H)            08/05/2020                CALCIUM                  9.4                 07/27/2020                GFRNONAA                 11 (L)              07/27/2020                GFRAA                    25 (L)              02/04/2020           CV: Echo 06/24/20: IMPRESSIONS  1. Left ventricular ejection fraction, by estimation, is 55 to 60%. The  left ventricle has normal function. The left ventricle has  no regional  wall motion abnormalities. There is moderate concentric left ventricular  hypertrophy. Left ventricular  diastolic parameters are consistent with Grade II diastolic dysfunction  (pseudonormalization). Elevated left ventricular end-diastolic pressure.  There is the interventricular septum is flattened in systole and diastole,  consistent with right ventricular  pressure and volume overload.  2. Right ventricular systolic function is low normal. The right  ventricular size is moderately  enlarged. There is mildly elevated  pulmonary artery systolic pressure.  3. Left atrial size was severely dilated.  4. Right atrial size was severely dilated.  5. The mitral valve is grossly normal. Mild mitral valve regurgitation.  No evidence of mitral stenosis.  6. Tricuspid valve regurgitation is severe.  7. The aortic valve is tricuspid. Aortic valve regurgitation is moderate.  No aortic stenosis is present.  8. The inferior vena cava is dilated in size with <50% respiratory  variability, suggesting right atrial pressure of 15 mmHg.  - Comparison(s): Changes from prior study are noted. TR appears severe.   [place] 06/23/20 (in setting of CHF, worsening CKD, anemia): Conclusion: 1. Elevated filling pressures, RV failure out of proportion to left-sided failure.  2. PAPI 1.7 suggesting RV dysfunction.  3. Preserved cardiac output.  4. Moderate pulmonary venous hypertension.  - Will admit patient for close monitoring of creatinine with IV diuresis. )       Anesthesia Quick Evaluation

## 2020-08-04 NOTE — Progress Notes (Signed)
Device order requested. Gae Dry emailed in secure message. Jovita Kussmaul, RN cc'd for both.

## 2020-08-04 NOTE — Progress Notes (Signed)
Patient denies shortness of breath, fever, cough or chest pain.  PCP - Dr Grier Mitts Cardiologist - Dr Loralie Champagne Electrophysiology - Dr Virl Axe  Chest x-ray - 06/23/20 (2V) EKG - 06/23/20 Stress Test - n/a ECHO - 06/24/20 Cardiac Cath - 06/23/20  ICD Pacemaker - Last remote check was on 08/03/20.  Marlowe Kays, RN sent device order request and emailed Rep Gae Dry.  Sleep Study -  Yes CPAP - dos not use cpap  Anesthesia review: Yes  STOP now taking any Aspirin (unless otherwise instructed by your surgeon), Aleve, Naproxen, Ibuprofen, Motrin, Advil, Goody's, BC's, all herbal medications, fish oil, and all vitamins.   Coronavirus Screening Covid test on 08/03/20 was negative.  Patient verbalized understanding of instructions that were given via phone.

## 2020-08-04 NOTE — Progress Notes (Signed)
Anesthesia Chart Review:  Case: [id number] Date/Time: 08/05/20 0852   Procedure: LEFT UPPER EXTREMITY ARTERIOVENOUS (AV) FISTULA CREATION (Left )   Anesthesia type: Monitor Anesthesia Care   Pre-op diagnosis: ESRD   Location: [facility] OR ROOM 55 / [place] [facility]   Surgeons: Cherre Robins, MD      DISCUSSION: Patient is a 75 year old female scheduled for the above procedure. S/p right IJ [facility] 07/05/20.   History includes never smoker, LBBB, nonischemic cardiomyopathy (04/2014; EF recovered after CRT-D/BiV pacing), CHF, AICD (generator replacement 10/06/15), afib/flutter (s/p DCCV 02/26/17, 07/24/17 & 02/12/20), OSA (does not use CPAP), HLD, anemia, asthma, COPD, PVD, ESRD (started HD 07/06/20), hypothyroidism, depression, HTN, Hepatitis C (treated with Harvoni), DM2, glaucoma, diverticulitis (s/p sigmoid colectomy 05/22/18), GI bleed (HGB 5.5 in setting of Eliquis, s/p PRBC,05/29/20 capsule endoscopy showed distal small bowel AVMs; 05/31/20 colonoscopy showed tiny non-bleeding angiodysplastic ileal lesion s/p Argon beam coagualtion, Eliquis stopped).    HF cardiology visit 07/27/20 with Darrick Grinder, NP.  Volume status managed with hemodialysis.  OptiVol stable.  ICD interrogation looks like she had been in A. fib since 07/12/2020.  On amiodarone, but continues to be off Eliquis due to recent GI bleed.  She was referred to EP for watchman device evaluation.  She was aware of plans for upcoming hemodialysis access.  Televisit with EP Dr. Caryl Comes on 07/29/20. Referred for Watchman device given afib and recent GI bleed. He wrote, "A shared decision has been made utilizing the [employer] of Cardiology shared decision tool to undergo Left Atrial Appendage Closure with Watchman device at this time.  Particularly in light of ESRD with high risk of bleeding and stroke, and with her hx of recurrent severe GI bleeding  She will need short term anticoagulation and perhaps would have to be considered for very brief  antiplatelet therapy...after full review of this patients clinical status, I feel that they are at moderate  risk at this time."   Device Information: Device Type:  Doctor, hospital and Phone #:  [employer]tronic: [id number] Pacemaker Dependent?:  Yes.   Date of Last Device Check:  08/03/2020       Normal Device Function?:  Yes.    Electrophysiologist's Recommendations:  Have magnet available.  Provide continuous ECG monitoring when magnet is used or reprogramming is to be performed.   Procedure may interfere with device function.  Magnet should be placed over device during procedure.   If patient is in prone position, will need to have industry present to reprogram device.  Palliative Care Consult 08/03/20 with Charlann Boxer, NP to discuss goals of care. Patient awaiting for permanent HD access.   08/03/2020 presurgical COVID-19 test negative.  Anesthesia team to evaluate on the day of surgery.   VS:  BP Readings from Last 3 Encounters:  07/27/20 138/60  07/27/20 116/63  07/12/20 134/61   Pulse Readings from Last 3 Encounters:  07/27/20 69  07/27/20 60  07/11/20 64    PROVIDERS: Billie Ruddy, MD is PCP Virl Axe, MD is EP cardiologist Loralie Champagne, MD is HF cardiologist [name], MD is nephrologist Silvano Rusk, MD is GI   LABS: For day of surgery. As of 07/12/20, H/H 10.8/34.7.   IMAGES: 1V PCXR 06/30/20 (during admission for CHF, worsening CKD, GI bleed): FINDINGS: There is persistent ill-defined opacity left lower lobe. Right lung is now clear. Cardiac enlargement is stable with pulmonary vascularity within normal limits. Pacemaker leads are attached to the right atrium, right ventricle, and coronary sinus.  No adenopathy. There is aortic atherosclerosis. No bone lesions. IMPRESSION: - Persistent suspected pneumonia left lower lobe. Right lung now clear. No new areas of opacity compared to prior study. - Stable cardiac enlargement.  Stable pacemaker lead placements. Aortic Atherosclerosis (ICD10-I70.0).   EKG: 06/23/20: Atrial-sensed ventricular-paced rhythm Biventricular pacemaker detected Abnormal ECG No significant change since last tracing Confirmed by Charolette Forward ([id number]) on 06/24/2020 10:07:01 PM   CV: Echo 06/24/20: IMPRESSIONS  1. Left ventricular ejection fraction, by estimation, is 55 to 60%. The  left ventricle has normal function. The left ventricle has no regional  wall motion abnormalities. There is moderate concentric left ventricular  hypertrophy. Left ventricular  diastolic parameters are consistent with Grade II diastolic dysfunction  (pseudonormalization). Elevated left ventricular end-diastolic pressure.  There is the interventricular septum is flattened in systole and diastole,  consistent with right ventricular  pressure and volume overload.  2. Right ventricular systolic function is low normal. The right  ventricular size is moderately enlarged. There is mildly elevated  pulmonary artery systolic pressure.  3. Left atrial size was severely dilated.  4. Right atrial size was severely dilated.  5. The mitral valve is grossly normal. Mild mitral valve regurgitation.  No evidence of mitral stenosis.  6. Tricuspid valve regurgitation is severe.  7. The aortic valve is tricuspid. Aortic valve regurgitation is moderate.  No aortic stenosis is present.  8. The inferior vena cava is dilated in size with <50% respiratory  variability, suggesting right atrial pressure of 15 mmHg.  - Comparison(s): Changes from prior study are noted. TR appears severe.   [place] 06/23/20 (in setting of CHF, worsening CKD, anemia): Conclusion: 1. Elevated filling pressures, RV failure out of proportion to left-sided failure.  2. PAPI 1.7 suggesting RV dysfunction.  3. Preserved cardiac output.  4. Moderate pulmonary venous hypertension.  - Will admit patient for close monitoring of creatinine with IV  diuresis.    NM PYP Cardiac Amyloidosis Scan 06/03/20: IMPRESSION: Visual and quantitative assessment (grade 2, H/CLL equal 1.1) are NOT suggestive of transthyretin amyloidosis.   RHC/LHC 12/11/19: Conclusion: 1. Normal left and right heart filling pressures.  2. Mild pulmonary hypertension.  3. Preserved cardiac output.  4. Mild nonobstructive coronary disease ][40% proximal LAD prior to D1 takeoff; 50% ostial D1].  5. CKD, used 20 cc contrast.     Past Medical History:  Diagnosis Date  . A-fib ([place])    on Eliquis  . AICD (automatic cardioverter/defibrillator) present 10/06/2015  . Anemia   . Angiodysplasia of small intestine ([place]) 06/10/2020   Ileum - seen on capsule endoscopy 05/2020 - ablated at colonoscopy  . ARF (acute renal failure) ([place])   . Arthritis    "qwhere" (01/03/2018)  . Asthma    reports mild asthma since childhood - had COPD on dx list from prior PCP  . Chronic bronchitis ([place])    "get it most years; not this past year though" (01/03/2018)  . Chronic kidney disease    "? stage;  followed by [facility]; [name]" (01/03/2018)  . Chronic systolic congestive heart failure ([place])   . COVID-19 05/03/2020  . DEPRESSION 12/17/2009   Annotation: PHQ-9 score = 14 done on 12/17/2009 Qualifier: Diagnosis of  By: Hassell Done FNP, Tori Milks    . Diabetes mellitus without complication (HCC)    DIET CONTROLLED   . Diverticulosis   . Enteritis   . FIBROIDS, UTERUS 03/05/2008  . GI bleeding 01/03/2018  . Glaucoma   . Gout   .  Hepatitis C    HEPATITIS C - s/p treatment with Harvoni, saw hepatology, Dawn Drazek  . History of blood transfusion ~ 11/2017  . Hyperlipemia 12/06/2012  . HYPERTENSION, BENIGN 04/24/2007  . Hypothyroidism   . LBBB (left bundle branch block)   . Lower GI bleeding    "been dealing w/it since 07/2017" (01/03/2018)  . Nonischemic cardiomyopathy ([place])   . OBESITY 05/27/2009  . OBSTRUCTIVE SLEEP APNEA 11/14/2007   no CPAP  . OSTEOPENIA 09/30/2008  .  Pain and swelling of left upper extremity 08/08/2017  . Personal history of other infectious and parasitic disease    Hepatitis B  . Pneumonia    "several times" (01/03/2018)  . Pulmonary edema, acute ([place]) 08/09/2017    Past Surgical History:  Procedure Laterality Date  . APPENDECTOMY    . CARDIAC CATHETERIZATION    . CARDIOVERSION N/A 12/14/2014   Procedure: CARDIOVERSION;  Surgeon: Dorothy Spark, MD;  Location: [facility] ENDOSCOPY;  Service: Cardiovascular;  Laterality: N/A;  . CARDIOVERSION N/A 02/26/2017   Procedure: CARDIOVERSION;  Surgeon: Evans Lance, MD;  Location: [place] CV LAB;  Service: Cardiovascular;  Laterality: N/A;  . CARDIOVERSION N/A 07/24/2017   Procedure: CARDIOVERSION;  Surgeon: Thayer Headings, MD;  Location: [facility] ENDOSCOPY;  Service: Cardiovascular;  Laterality: N/A;  . CARDIOVERSION N/A 02/12/2020   Procedure: CARDIOVERSION;  Surgeon: [name], MD;  Location: [facility] ENDOSCOPY;  Service: Cardiovascular;  Laterality: N/A;  . COLONOSCOPY N/A 11/08/2017   Procedure: COLONOSCOPY;  Surgeon: Milus Banister, MD;  Location: [facility] ENDOSCOPY;  Service: Endoscopy;  Laterality: N/A;  . COLONOSCOPY WITH PROPOFOL N/A 05/31/2020   Procedure: COLONOSCOPY WITH PROPOFOL;  Surgeon: Irene Shipper, MD;  Location: [facility] ENDOSCOPY;  Service: Endoscopy;  Laterality: N/A;  . DILATION AND CURETTAGE OF UTERUS    . ENTEROSCOPY N/A 03/02/2020   Procedure: ENTEROSCOPY;  Surgeon: Yetta Flock, MD;  Location: [facility] ENDOSCOPY;  Service: Gastroenterology;  Laterality: N/A;  . EP IMPLANTABLE DEVICE N/A 10/06/2015   Procedure: BIV ICD Generator Changeout;  Surgeon: [name], MD;  Location: [place] CV LAB;  Service: Cardiovascular;  Laterality: N/A;  . ESOPHAGOGASTRODUODENOSCOPY N/A 10/26/2017   Procedure: ESOPHAGOGASTRODUODENOSCOPY (EGD);  Surgeon: Ladene Artist, MD;  Location: [facility] ENDOS[facility];  Service: Endoscopy;  Laterality: N/A;  . ESOPHAGOGASTRODUODENOSCOPY (EGD) WITH PROPOFOL N/A  11/07/2017   Procedure: ESOPHAGOGASTRODUODENOSCOPY (EGD) WITH PROPOFOL;  Surgeon: Milus Banister, MD;  Location: [facility] ENDOSCOPY;  Service: Endoscopy;  Laterality: N/A;  . ESOPHAGOGASTRODUODENOSCOPY (EGD) WITH PROPOFOL N/A 05/29/2020   Procedure: ESOPHAGOGASTRODUODENOSCOPY (EGD) WITH PROPOFOL;  Surgeon: Doran Stabler, MD;  Location: [place];  Service: Gastroenterology;  Laterality: N/A;  . GIVENS CAPSULE STUDY N/A 05/19/2020   Procedure: GIVENS CAPSULE STUDY;  Surgeon: [name], MD;  Location: [place];  Service: Endoscopy;  Laterality: N/A;  .adm for obs since pacemaker, PA wil enter order and see pt  . GIVENS CAPSULE STUDY N/A 05/29/2020   Procedure: GIVENS CAPSULE STUDY;  Surgeon: Doran Stabler, MD;  Location: [place];  Service: Gastroenterology;  Laterality: N/A;  . HOT HEMOSTASIS N/A 10/26/2017   Procedure: HOT HEMOSTASIS (ARGON PLASMA COAGULATION/BICAP);  Surgeon: Ladene Artist, MD;  Location: [facility] ENDOSCOPY;  Service: Endoscopy;  Laterality: N/A;  . HOT HEMOSTASIS N/A 03/02/2020   Procedure: HOT HEMOSTASIS (ARGON PLASMA COAGULATION/BICAP);  Surgeon: Yetta Flock, MD;  Location: [facility] ENDOSCOPY;  Service: Gastroenterology;  Laterality: N/A;  . HOT HEMOSTASIS N/A 05/31/2020   Procedure: HOT  HEMOSTASIS (ARGON PLASMA COAGULATION/BICAP);  Surgeon: Irene Shipper, MD;  Location: [facility] ENDOSCOPY;  Service: Endoscopy;  Laterality: N/A;  . INSERT / REPLACE / REMOVE PACEMAKER  ?2008  . IR PERC TUN PERIT CATH WO PORT S&I Dartha Lodge  07/05/2020  . POLYPECTOMY  11/08/2017   Procedure: POLYPECTOMY;  Surgeon: Milus Banister, MD;  Location: [facility] ENDOSCOPY;  Service: Endoscopy;;  . PROCTOSCOPY N/A 05/22/2018   Procedure: RIGID PROCTOSCOPY;  Surgeon: Michael [place], MD;  Location: [facility] ORS;  Service: General;  Laterality: N/A;  . RIGHT HEART CATH N/A 02/13/2018   Procedure: RIGHT HEART CATH;  Surgeon: [name], MD;  Location: [place] CV LAB;  Service: Cardiovascular;  Laterality:  N/A;  . RIGHT HEART CATH N/A 06/23/2020   Procedure: RIGHT HEART CATH;  Surgeon: [name], MD;  Location: [place] CV LAB;  Service: Cardiovascular;  Laterality: N/A;  . RIGHT/LEFT HEART CATH AND CORONARY ANGIOGRAPHY N/A 12/11/2019   Procedure: RIGHT/LEFT HEART CATH AND CORONARY ANGIOGRAPHY;  Surgeon: [name], MD;  Location: [place] CV LAB;  Service: Cardiovascular;  Laterality: N/A;  . SUBMUCOSAL TATTOO INJECTION  03/02/2020   Procedure: SUBMUCOSAL TATTOO INJECTION;  Surgeon: Yetta Flock, MD;  Location: [facility] ENDOSCOPY;  Service: Gastroenterology;;  . Lia Foyer TATTOO INJECTION  05/31/2020   Procedure: SUBMUCOSAL TATTOO INJECTION;  Surgeon: Irene Shipper, MD;  Location: [place];  Service: Endoscopy;;  . TUBAL LIGATION    . XI ROBOTIC ASSISTED LOWER ANTERIOR RESECTION N/A 05/22/2018   Procedure: XI ROBOTIC ASSISTED SIGMOID COLOECTOMY MOBILIZATION OF SPLENIC FLEXURE, FIREFLY ASSESSMENT OF PERFUSION;  Surgeon: Michael [place], MD;  Location: [facility] ORS;  Service: General;  Laterality: N/A;  ERAS PATHWAY    MEDICATIONS: No current facility-administered medications for this encounter.   [name] acetaminophen (TYLENOL) 325 MG tablet  . amiodarone (PACERONE) 200 MG tablet  . carvedilol (COREG) 3.125 MG tablet  . cromolyn (OPTICROM) 4 % ophthalmic solution  . gabapentin (NEURONTIN) 300 MG capsule  . isosorbide-hydrALAZINE (BIDIL) 20-37.5 MG tablet  . levothyroxine (SYNTHROID) 137 MCG tablet  . MIRALAX 17 g packet  . nitroGLYCERIN (NITROSTAT) 0.4 MG SL tablet  . pantoprazole (PROTONIX) 40 MG tablet  . traZODone (DESYREL) 50 MG tablet  . allopurinol (ZYLOPRIM) 100 MG tablet  . ferrous sulfate 325 (65 FE) MG tablet    Myra Gianotti, PA-C Surgical Short Stay/Anesthesiology [facility] Phone [phone] [facility] Phone [phone] 08/04/2020 2:09 PM

## 2020-08-05 ENCOUNTER — Ambulatory Visit (HOSPITAL_COMMUNITY)
Admission: RE | Admit: 2020-08-05 | Discharge: 2020-08-05 | Disposition: A | Discharge status: Home / Self Care | Payer: Medicare Other | Source: Ambulatory Visit | Attending: Vascular Surgery | Admitting: Vascular Surgery

## 2020-08-05 ENCOUNTER — Ambulatory Visit (HOSPITAL_COMMUNITY): Payer: Medicare Other | Admitting: Vascular Surgery

## 2020-08-05 ENCOUNTER — Telehealth: Payer: Self-pay | Admitting: Family Medicine

## 2020-08-05 ENCOUNTER — Encounter (HOSPITAL_COMMUNITY)
Admission: RE | Disposition: A | Discharge status: Home / Self Care | Payer: Self-pay | Source: Ambulatory Visit | Attending: Vascular Surgery

## 2020-08-05 ENCOUNTER — Encounter (HOSPITAL_COMMUNITY): Payer: Self-pay | Admitting: Vascular Surgery

## 2020-08-05 ENCOUNTER — Other Ambulatory Visit: Payer: Self-pay

## 2020-08-05 DIAGNOSIS — I5022 Chronic systolic (congestive) heart failure: Secondary | ICD-10-CM | POA: Insufficient documentation

## 2020-08-05 DIAGNOSIS — I132 Hypertensive heart and chronic kidney disease with heart failure and with stage 5 chronic kidney disease, or end stage renal disease: Secondary | ICD-10-CM | POA: Insufficient documentation

## 2020-08-05 DIAGNOSIS — Z8616 Personal history of COVID-19: Secondary | ICD-10-CM | POA: Insufficient documentation

## 2020-08-05 DIAGNOSIS — Z992 Dependence on renal dialysis: Secondary | ICD-10-CM | POA: Diagnosis not present

## 2020-08-05 DIAGNOSIS — N184 Chronic kidney disease, stage 4 (severe): Secondary | ICD-10-CM

## 2020-08-05 DIAGNOSIS — N185 Chronic kidney disease, stage 5: Secondary | ICD-10-CM

## 2020-08-05 DIAGNOSIS — Z79890 Hormone replacement therapy (postmenopausal): Secondary | ICD-10-CM | POA: Diagnosis not present

## 2020-08-05 DIAGNOSIS — N186 End stage renal disease: Secondary | ICD-10-CM | POA: Insufficient documentation

## 2020-08-05 DIAGNOSIS — E1122 Type 2 diabetes mellitus with diabetic chronic kidney disease: Secondary | ICD-10-CM | POA: Insufficient documentation

## 2020-08-05 DIAGNOSIS — Z79899 Other long term (current) drug therapy: Secondary | ICD-10-CM | POA: Insufficient documentation

## 2020-08-05 HISTORY — PX: AV FISTULA PLACEMENT: SHX1204

## 2020-08-05 LAB — GLUCOSE, CAPILLARY
Glucose-Capillary: 97 mg/dL (ref 70–99)
Glucose-Capillary: 87 mg/dL (ref 70–99)
Glucose-Capillary: 103 mg/dL — ABNORMAL HIGH (ref 70–99)

## 2020-08-05 LAB — POCT I-STAT, CHEM 8
BUN: 19 mg/dL (ref 8–23)
Calcium, Ion: 1.12 mmol/L — ABNORMAL LOW (ref 1.15–1.40)
Chloride: 95 mmol/L — ABNORMAL LOW (ref 98–111)
Creatinine, Ser: 3.8 mg/dL — ABNORMAL HIGH (ref 0.44–1.00)
Glucose, Bld: 89 mg/dL (ref 70–99)
HCT: 34 % — ABNORMAL LOW (ref 36.0–46.0)
Hemoglobin: 11.6 g/dL — ABNORMAL LOW (ref 12.0–15.0)
Potassium: 3.8 mmol/L (ref 3.5–5.1)
Sodium: 137 mmol/L (ref 135–145)
TCO2: 32 mmol/L (ref 22–32)

## 2020-08-05 SURGERY — ARTERIOVENOUS (AV) FISTULA CREATION
Anesthesia: Monitor Anesthesia Care | Laterality: Left

## 2020-08-05 MED ORDER — SODIUM CHLORIDE 0.9 % IV SOLN
INTRAVENOUS | Status: AC
  Filled 2020-08-05: qty 1.2

## 2020-08-05 MED ORDER — SODIUM CHLORIDE 0.9 % IV SOLN
INTRAVENOUS | Status: DC
  Administered 2020-08-05: 10 mL/h via INTRAVENOUS

## 2020-08-05 MED ORDER — PROPOFOL 10 MG/ML IV BOLUS
INTRAVENOUS | Status: DC | PRN
  Administered 2020-08-05: 30 mg via INTRAVENOUS

## 2020-08-05 MED ORDER — OXYCODONE-ACETAMINOPHEN 5-325 MG PO TABS: 1.0000 | ORAL_TABLET | ORAL | 0 refills | Status: DC | PRN

## 2020-08-05 MED ORDER — PHENYLEPHRINE HCL-NACL 10-0.9 MG/250ML-% IV SOLN
INTRAVENOUS | Status: DC | PRN
  Administered 2020-08-05: 20 ug/min via INTRAVENOUS

## 2020-08-05 MED ORDER — LIDOCAINE 2% (20 MG/ML) 5 ML SYRINGE
INTRAMUSCULAR | Status: AC
  Filled 2020-08-05: qty 5

## 2020-08-05 MED ORDER — FENTANYL CITRATE (PF) 100 MCG/2ML IJ SOLN
50.0000 ug | Freq: Once | INTRAMUSCULAR | Status: AC
Start: 2020-08-05 — End: 2020-08-05

## 2020-08-05 MED ORDER — LIDOCAINE 2% (20 MG/ML) 5 ML SYRINGE
INTRAMUSCULAR | Status: DC | PRN
  Administered 2020-08-05: 30 mg via INTRAVENOUS

## 2020-08-05 MED ORDER — ONDANSETRON HCL 4 MG/2ML IJ SOLN
INTRAMUSCULAR | Status: DC | PRN
  Administered 2020-08-05: 4 mg via INTRAVENOUS

## 2020-08-05 MED ORDER — ROPIVACAINE HCL 5 MG/ML IJ SOLN
INTRAMUSCULAR | Status: DC | PRN
  Administered 2020-08-05: 10 mL via PERINEURAL

## 2020-08-05 MED ORDER — CHLORHEXIDINE GLUCONATE 4 % EX LIQD: 60.0000 mL | Freq: Once | CUTANEOUS | Status: DC

## 2020-08-05 MED ORDER — MIDAZOLAM HCL 2 MG/2ML IJ SOLN
INTRAMUSCULAR | Status: AC
  Administered 2020-08-05: 1 mg via INTRAVENOUS
  Filled 2020-08-05: qty 2

## 2020-08-05 MED ORDER — ONDANSETRON HCL 4 MG/2ML IJ SOLN
INTRAMUSCULAR | Status: AC
  Filled 2020-08-05: qty 2

## 2020-08-05 MED ORDER — 0.9 % SODIUM CHLORIDE (POUR BTL) OPTIME
TOPICAL | Status: DC | PRN
  Administered 2020-08-05: 1000 mL

## 2020-08-05 MED ORDER — MIDAZOLAM HCL 2 MG/2ML IJ SOLN: 1.0000 mg | Freq: Once | INTRAMUSCULAR | Status: AC

## 2020-08-05 MED ORDER — LIDOCAINE HCL (PF) 1 % IJ SOLN: INTRAMUSCULAR | Status: DC | PRN

## 2020-08-05 MED ORDER — FENTANYL CITRATE (PF) 100 MCG/2ML IJ SOLN
INTRAMUSCULAR | Status: DC | PRN
  Administered 2020-08-05: 50 ug via INTRAVENOUS

## 2020-08-05 MED ORDER — FENTANYL CITRATE (PF) 100 MCG/2ML IJ SOLN
INTRAMUSCULAR | Status: AC
  Administered 2020-08-05: 50 ug via INTRAVENOUS
  Filled 2020-08-05: qty 2

## 2020-08-05 MED ORDER — CHLORHEXIDINE GLUCONATE 0.12 % MT SOLN
15.0000 mL | Freq: Once | OROMUCOSAL | Status: AC
  Administered 2020-08-05: 15 mL via OROMUCOSAL
  Filled 2020-08-05: qty 15

## 2020-08-05 MED ORDER — CEFAZOLIN SODIUM-DEXTROSE 2-4 GM/100ML-% IV SOLN
2.0000 g | INTRAVENOUS | Status: AC
  Administered 2020-08-05: 2 g via INTRAVENOUS
  Filled 2020-08-05: qty 100

## 2020-08-05 MED ORDER — FENTANYL CITRATE (PF) 250 MCG/5ML IJ SOLN
INTRAMUSCULAR | Status: AC
  Filled 2020-08-05: qty 5

## 2020-08-05 MED ORDER — SODIUM CHLORIDE 0.9 % IV SOLN
INTRAVENOUS | Status: DC | PRN
  Administered 2020-08-05: 500 mL

## 2020-08-05 MED ORDER — LIDOCAINE HCL (PF) 1 % IJ SOLN
INTRAMUSCULAR | Status: DC | PRN
  Administered 2020-08-05: 10 mL

## 2020-08-05 MED ORDER — PROPOFOL 500 MG/50ML IV EMUL
INTRAVENOUS | Status: DC | PRN
  Administered 2020-08-05: 75 ug/kg/min via INTRAVENOUS

## 2020-08-05 MED ORDER — DEXAMETHASONE SODIUM PHOSPHATE 10 MG/ML IJ SOLN
INTRAMUSCULAR | Status: DC | PRN
  Administered 2020-08-05: 5 mg via INTRAVENOUS

## 2020-08-05 MED ORDER — ORAL CARE MOUTH RINSE: 15.0000 mL | Freq: Once | OROMUCOSAL | Status: AC

## 2020-08-05 SURGICAL SUPPLY — 36 items
ADH SKN CLS APL DERMABOND .7 (GAUZE/BANDAGES/DRESSINGS) ×1
APL PRP STRL LF DISP 70% ISPRP (MISCELLANEOUS) ×1
ARMBAND PINK RESTRICT EXTREMIT (MISCELLANEOUS) ×2 IMPLANT
BENZOIN TINCTURE PRP APPL 2/3 (GAUZE/BANDAGES/DRESSINGS) IMPLANT
CANISTER SUCT 3000ML PPV (MISCELLANEOUS) ×2 IMPLANT
CANNULA VESSEL 3MM 2 BLNT TIP (CANNULA) ×2 IMPLANT
CHLORAPREP W/TINT 26 (MISCELLANEOUS) ×2 IMPLANT
CLIP VESOCCLUDE MED 6/CT (CLIP) ×2 IMPLANT
CLIP VESOCCLUDE SM WIDE 6/CT (CLIP) ×2 IMPLANT
COVER PROBE W GEL 5X96 (DRAPES) ×2 IMPLANT
DERMABOND ADVANCED (GAUZE/BANDAGES/DRESSINGS) ×1
DERMABOND ADVANCED .7 DNX12 (GAUZE/BANDAGES/DRESSINGS) ×1 IMPLANT
DRAPE EXTREMITY T 121X128X90 (DISPOSABLE) ×2 IMPLANT
ELECT REM PT RETURN 9FT ADLT (ELECTROSURGICAL) ×2
ELECTRODE REM PT RTRN 9FT ADLT (ELECTROSURGICAL) ×1 IMPLANT
GLOVE SURG SS PI 8.0 STRL IVOR (GLOVE) ×2 IMPLANT
GOWN STRL REUS W/ TWL LRG LVL3 (GOWN DISPOSABLE) ×2 IMPLANT
GOWN STRL REUS W/ TWL XL LVL3 (GOWN DISPOSABLE) ×1 IMPLANT
GOWN STRL REUS W/TWL LRG LVL3 (GOWN DISPOSABLE) ×4
GOWN STRL REUS W/TWL XL LVL3 (GOWN DISPOSABLE) ×2
INSERT FOGARTY SM (MISCELLANEOUS) IMPLANT
KIT BASIN OR (CUSTOM PROCEDURE TRAY) ×2 IMPLANT
KIT TURNOVER KIT B (KITS) ×2 IMPLANT
NEEDLE 18GX1X1/2 (RX/OR ONLY) (NEEDLE) IMPLANT
NS IRRIG 1000ML POUR BTL (IV SOLUTION) ×2 IMPLANT
PACK CV ACCESS (CUSTOM PROCEDURE TRAY) ×2 IMPLANT
PAD ARMBOARD 7.5X6 YLW CONV (MISCELLANEOUS) ×4 IMPLANT
STRIP CLOSURE SKIN 1/2X4 (GAUZE/BANDAGES/DRESSINGS) IMPLANT
SUT MNCRL AB 4-0 PS2 18 (SUTURE) ×4 IMPLANT
SUT PROLENE 6 0 BV (SUTURE) ×6 IMPLANT
SUT VIC AB 3-0 SH 27 (SUTURE) ×4
SUT VIC AB 3-0 SH 27X BRD (SUTURE) ×2 IMPLANT
SYR 3ML LL SCALE MARK (SYRINGE) IMPLANT
TOWEL GREEN STERILE (TOWEL DISPOSABLE) ×2 IMPLANT
UNDERPAD 30X36 HEAVY ABSORB (UNDERPADS AND DIAPERS) ×2 IMPLANT
WATER STERILE IRR 1000ML POUR (IV SOLUTION) ×2 IMPLANT

## 2020-08-05 NOTE — Anesthesia Procedure Notes (Signed)
Procedure Name: MAC Date/Time: 08/05/2020 9:03 AM Performed by: Reece Agar, CRNA Pre-anesthesia Checklist: Patient identified, Emergency Drugs available, Suction available, Patient being monitored and Timeout performed Patient Re-evaluated:Patient Re-evaluated prior to induction Oxygen Delivery Method: Simple face mask

## 2020-08-05 NOTE — Transfer of Care (Signed)
Immediate Anesthesia Transfer of Care Note  Patient: Morgan Clay  Procedure(s) Performed: LEFT UPPER EXTREMITY ARTERIOVENOUS (AV) FISTULA CREATION (Left )  Patient Location: PACU  Anesthesia Type:MAC and Regional  Level of Consciousness: awake and alert   Airway & Oxygen Therapy: Patient Spontanous Breathing and Patient connected to face mask oxygen  Post-op Assessment: Report given to RN and Post -op Vital signs reviewed and stable  Post vital signs: Reviewed and stable  Last Vitals:  Vitals Value Taken Time  BP 135/54 08/05/20 1049  Temp    Pulse 61 08/05/20 1049  Resp 15 08/05/20 1049  SpO2 100 % 08/05/20 1049  Vitals shown include unvalidated device data.  Last Pain:  Vitals:   08/05/20 0741  TempSrc:   PainSc: 0-No pain      Patients Stated Pain Goal: 4 (38/17/71 1657)  Complications: No complications documented.

## 2020-08-05 NOTE — Discharge Instructions (Signed)
[facility]  Discharge Instructions  AV Fistula or Graft Surgery for Dialysis Access  Please refer to the following instructions for your post-procedure care. Your surgeon or physician assistant will discuss any changes with you.  Activity  You may drive the day following your surgery, if you are comfortable and no longer taking prescription pain medication. Resume full activity as the soreness in your incision resolves.  Bathing/Showering  You may shower after you go home. Keep your incision dry for 48 hours. Do not soak in a bathtub, hot tub, or swim until the incision heals completely. You may not shower if you have a hemodialysis catheter.  Incision Care  Clean your incision with mild soap and water after 48 hours. Pat the area dry with a clean towel. You do not need a bandage unless otherwise instructed. Do not apply any ointments or creams to your incision. You may have skin glue on your incision. Do not peel it off. It will come off on its own in about one week. Your arm may swell a bit after surgery. To reduce swelling use pillows to elevate your arm so it is above your heart. Your doctor will tell you if you need to lightly wrap your arm with an ACE bandage.  Diet  Resume your normal diet. There are not special food restrictions following this procedure. In order to heal from your surgery, it is CRITICAL to get adequate nutrition. Your body requires vitamins, minerals, and protein. Vegetables are the best source of vitamins and minerals. Vegetables also provide the perfect balance of protein. Processed food has little nutritional value, so try to avoid this.  Medications  Resume taking all of your medications. If your incision is causing pain, you may take over-the counter pain relievers such as acetaminophen (Tylenol). If you were prescribed a stronger pain medication, please be aware these medications can cause nausea and constipation. Prevent  nausea by taking the medication with a snack or meal. Avoid constipation by drinking plenty of fluids and eating foods with high amount of fiber, such as fruits, vegetables, and grains.  Do not take Tylenol if you are taking prescription pain medications.  Follow up Your surgeon may want to see you in the office following your access surgery. If so, this will be arranged at the time of your surgery.  Please call us immediately for any of the following conditions:  . Increased pain, redness, drainage (pus) from your incision site . Fever of 101 degrees or higher . Severe or worsening pain at your incision site . Hand pain or numbness. .  Reduce your risk of vascular disease:  . Stop smoking. If you would like help, call [facility] at 1-800-QUIT-NOW 716-060-7424) or [place] at (810)283-5582  . Manage your cholesterol . Maintain a desired weight . Control your diabetes . Keep your blood pressure down  Dialysis  It will take several weeks to several months for your new dialysis access to be ready for use. Your surgeon will determine when it is okay to use it. Your nephrologist will continue to direct your dialysis. You can continue to use your Permcath until your new access is ready for use.   08/05/2020 Morgan Clay [id number] 1946-03-16  Surgeon(s): Cherre Robins, MD  Procedure(s): LEFT UPPER EXTREMITY ARTERIOVENOUS (AV) FISTULA CREATION   May stick graft immediately   May stick graft on designated area only:   X Do not stick left AV fistula for 12 weeks  If you have any questions, please call the office at [phone].

## 2020-08-05 NOTE — Telephone Encounter (Signed)
Tried calling patient to schedule Medicare Annual Wellness Visit (AWV) either virtually or in office  No answer  Last AWV 07/23/19  please schedule at anytime with [facility] Nurse Health Advisor 1 or 2   This should be a 45 minute visit. 

## 2020-08-05 NOTE — Progress Notes (Signed)
Orthopedic Tech Progress Note Patient Details:  Morgan Clay 03-05-1946 [id number]  Ortho Devices Type of Ortho Device: Arm sling Ortho Device/Splint Interventions: Ordered   Post Interventions Patient Tolerated: Other (comment) Instructions Provided: Other (comment)   [name] 08/05/2020, 11:25 AM

## 2020-08-05 NOTE — Anesthesia Procedure Notes (Signed)
Anesthesia Regional Block: Supraclavicular block   Pre-Anesthetic Checklist: ,, timeout performed, Correct Patient, Correct Site, Correct Laterality, Correct Procedure, Correct Position, site marked, Risks and benefits discussed,  Surgical consent,  Pre-op evaluation,  At surgeon's request and post-op pain management  Laterality: Left  Prep: Dura Prep       Needles:  Injection technique: Single-shot  Needle Type: Echogenic Stimulator Needle     Needle Length: 5cm  Needle Gauge: 20     Additional Needles:   Procedures:,,,, ultrasound used (permanent image in chart),,,,  Narrative:  Start time: 08/05/2020 8:40 AM End time: 08/05/2020 8:43 AM Injection made incrementally with aspirations every 5 mL.  Performed by: Personally  Anesthesiologist: Darral Dash, DO  Additional Notes: Patient identified. Risks/Benefits/Options discussed with patient including but not limited to bleeding, infection, nerve damage, failed block, incomplete pain control. Patient expressed understanding and wished to proceed. All questions were answered. Sterile technique was used throughout the entire procedure. Please see nursing notes for vital signs. Aspirated in 5cc intervals with injection for negative confirmation. Patient was given instructions on fall risk and not to get out of bed. All questions and concerns addressed with instructions to call with any issues or inadequate analgesia.

## 2020-08-05 NOTE — Op Note (Signed)
DATE OF SERVICE: 08/05/2020  PATIENT:  Morgan Clay  75 y.o. female  PRE-OPERATIVE DIAGNOSIS:  CKD approaching ESRD  POST-OPERATIVE DIAGNOSIS:  Same  PROCEDURE:   Left brachiobasilic arteriovenous fistula creation  SURGEON:  Surgeon(s) and Role:    * Cherre Robins, MD - Primary  ASSISTANT: Paulo Fruit, PA-C  An assistant was required to facilitate exposure and expedite the case.  ANESTHESIA:   regional and MAC  EBL: min  BLOOD ADMINISTERED:none  DRAINS: none   LOCAL MEDICATIONS USED:  NONE  SPECIMEN:  none  COUNTS: confirmed correct.  TOURNIQUET:  None  PATIENT DISPOSITION:  PACU - hemodynamically stable.   Delay start of Pharmacological VTE agent (>24hrs) due to surgical blood loss or risk of bleeding: no  INDICATION FOR PROCEDURE: Aliveah Clay is a 75 y.o. female with deteriorating renal function, who is likely to require dialysis in the future. After careful discussion of risks, benefits, and alternatives the patient was offered brachiobasilic arteriovenous fistula. We specifically discussed risk of steal syndrome. The patient understood and wished to proceed.  OPERATIVE FINDINGS: healthy basilic vein. A second incision was made to ligate multiple branches in the distal arm to promote maturation of the fistula.  DESCRIPTION OF PROCEDURE: After identification of the patient in the pre-operative holding area, the patient was transferred to the operating room. The patient was positioned supine on the operating room table. Anesthesia was induced. The left arm was prepped and draped in standard fashion. A surgical pause was performed confirming correct patient, procedure, and operative location.  Using intraoperative ultrasound, the course of the basilic vein and brachial artery were mapped.  I noted a normal bifurcation of the brachial artery just past the antecubitum.  A transverse incision was made just distal to the antecubital crease.  Incision was carried  down through subcutaneous tissue until the aponeurosis of the biceps was identified.  This was divided sharply.  The brachial sheath was identified, skeletonized, and the brachial artery encircled proximally distally.  I then explored the cephalic vein.  This appeared on ultrasound to be quite healthy, but was fragile and diminutive once exposed.  I to my attention of the basilic vein which appeared much more robust.  The basilic vein was skeletonized in the medial aspect of the wound.  At a branch point in the distal aspect of the wound the branches were ligated, the vein transected, and a bulldog clamp was applied to the proximal vein.  Silastic Vesseloops were tightened around the brachial artery proximally distally.  An anterior arteriotomy was made with 11 blade and extended with Potts scissors.  The cut end of the basilic vein was spatulated with Potts scissors.  The vein was then sewn end-to-side to the arteriotomy using continuous running suture of 6-0 Prolene.  Immediately prior to completion the anastomosis was flushed and de-aired.  Anastomosis was completed.  Clamps were released.  Hemostasis was achieved.  Good thrill was heard in the basilic vein fistula.  Doppler flow was heard in the radial artery which augmented minimally with compression of the fistula.  I noticed a branch proximally on the basilic vein which I could reach through the antecubital exposure this was ligated proximally distally and divided.  I then looked at the fistula again with ultrasound and noted 3 large branches in the distal arm.  I elected to ligate the sidebranches to promote patency and maturation of the fistula.  A separate distal arm longitudinal incision was made over the branch point.  This was  carried down through subcutaneous tissue into the basilic fascia was encountered and divided.  The basilic vein was skeletonized.  The fistula branches were then divided proximally distally with 3-0 silk suture and divided.   Flow was much improved in the fistula.  Wounds were closed in layers using 3-0 Vicryl and 4-0 Monocryl.  Dermabond was applied  Upon completion of the case instrument and sharps counts were confirmed correct. The patient was transferred to the PACU in good condition. I was present for all portions of the procedure.  [name]. Stanford Breed, MD [facility] Phone Number: [phone] 08/05/2020 10:55 AM

## 2020-08-05 NOTE — Anesthesia Postprocedure Evaluation (Signed)
Anesthesia Post Note  Patient: Morgan Clay  Procedure(s) Performed: LEFT UPPER EXTREMITY ARTERIOVENOUS (AV) FISTULA CREATION (Left )     Patient location during evaluation: PACU Anesthesia Type: Regional and MAC Level of consciousness: awake and alert Pain management: pain level controlled Vital Signs Assessment: post-procedure vital signs reviewed and stable Respiratory status: spontaneous breathing, nonlabored ventilation, respiratory function stable and patient connected to nasal cannula oxygen Cardiovascular status: stable and blood pressure returned to baseline Postop Assessment: no apparent nausea or vomiting Anesthetic complications: no   No complications documented.  Last Vitals:  Vitals:   08/05/20 1204 08/05/20 1205  BP: (!) 131/58   Pulse: 60 60  Resp: 12 17  Temp:  (!) 36.1 C  SpO2: 100% 100%    Last Pain:  Vitals:   08/05/20 1205  TempSrc:   PainSc: 0-No pain                 Belenda Cruise P Nanie Dunkleberger

## 2020-08-05 NOTE — Interval H&P Note (Signed)
History and Physical Interval Note:  08/05/2020 8:37 AM  Morgan Clay  has presented today for surgery, with the diagnosis of ESRD.  The various methods of treatment have been discussed with the patient and family. After consideration of risks, benefits and other options for treatment, the patient has consented to  Procedure(s): LEFT UPPER EXTREMITY ARTERIOVENOUS (AV) FISTULA CREATION (Left) as a surgical intervention.  The patient's history has been reviewed, patient examined, no change in status, stable for surgery.  I have reviewed the patient's chart and labs.  Questions were answered to the patient's satisfaction.     Cherre Robins

## 2020-08-06 ENCOUNTER — Telehealth: Payer: Self-pay

## 2020-08-06 ENCOUNTER — Other Ambulatory Visit: Payer: Self-pay | Admitting: Physician Assistant

## 2020-08-06 ENCOUNTER — Encounter (HOSPITAL_COMMUNITY): Payer: Self-pay | Admitting: Vascular Surgery

## 2020-08-06 NOTE — Telephone Encounter (Signed)
Patient is s/p AVF on 3/31 and says the oxycodone is making her itch. She says tylenol by itself does not help the pain - but does not make her itch. She says tramadol does not help her either. PA sent in Lortab and advised patient to take with half a benadryl. Advised patient this could make her drowsy. Patient verbalizes understanding.

## 2020-08-06 NOTE — Telephone Encounter (Signed)
Unable to send in Lortab due to patient already having picked up narcotic scrip, advised patient to try halving the oxycodone and taking with a whole benadryl. Patient verbalizes understanding.

## 2020-08-09 ENCOUNTER — Telehealth: Payer: Self-pay

## 2020-08-09 DIAGNOSIS — B1920 Unspecified viral hepatitis C without hepatic coma: Secondary | ICD-10-CM

## 2020-08-09 DIAGNOSIS — K7469 Other cirrhosis of liver: Secondary | ICD-10-CM

## 2020-08-09 NOTE — Telephone Encounter (Signed)
-----   Message from Marlon Pel, RN sent at 04/15/2020  2:13 PM EST ----- Regarding: FW: Ultrasound reminder April 2022  ----- Message ----- From: [name], MD Sent: 04/15/2020   1:03 PM EST To: Marlon Pel, RN Subject: Ultrasound reminder April 2022                 This lady has cirrhosis with you please place one of your reminders for August 06, 2020 for right upper quadrant ultrasound due to cirrhosis   thanks

## 2020-08-09 NOTE — Telephone Encounter (Signed)
Patient notified she will be contacted by Central scheduling to assist her in arranging RUQ [name].

## 2020-08-12 DIAGNOSIS — K7469 Other cirrhosis of liver: Secondary | ICD-10-CM

## 2020-08-12 DIAGNOSIS — K7460 Unspecified cirrhosis of liver: Secondary | ICD-10-CM | POA: Insufficient documentation

## 2020-08-12 HISTORY — DX: Other cirrhosis of liver: K74.69

## 2020-08-17 NOTE — Progress Notes (Signed)
Remote ICD transmission.   

## 2020-08-24 ENCOUNTER — Encounter (HOSPITAL_COMMUNITY): Payer: Medicare Other

## 2020-08-26 ENCOUNTER — Telehealth (INDEPENDENT_AMBULATORY_CARE_PROVIDER_SITE_OTHER): Payer: Medicare Other | Admitting: Family Medicine

## 2020-08-26 ENCOUNTER — Ambulatory Visit (HOSPITAL_COMMUNITY): Payer: Medicare Other

## 2020-08-26 ENCOUNTER — Telehealth: Payer: Medicare Other | Admitting: Family Medicine

## 2020-08-26 ENCOUNTER — Encounter (HOSPITAL_BASED_OUTPATIENT_CLINIC_OR_DEPARTMENT_OTHER): Payer: Self-pay | Admitting: *Deleted

## 2020-08-26 ENCOUNTER — Emergency Department (HOSPITAL_BASED_OUTPATIENT_CLINIC_OR_DEPARTMENT_OTHER)
Admission: EM | Admit: 2020-08-26 | Discharge: 2020-08-26 | Disposition: A | Discharge status: Home / Self Care | Payer: Medicare Other | Attending: Emergency Medicine | Admitting: Emergency Medicine

## 2020-08-26 ENCOUNTER — Other Ambulatory Visit: Payer: Self-pay

## 2020-08-26 DIAGNOSIS — N184 Chronic kidney disease, stage 4 (severe): Secondary | ICD-10-CM | POA: Insufficient documentation

## 2020-08-26 DIAGNOSIS — E039 Hypothyroidism, unspecified: Secondary | ICD-10-CM | POA: Insufficient documentation

## 2020-08-26 DIAGNOSIS — Z8616 Personal history of COVID-19: Secondary | ICD-10-CM | POA: Diagnosis not present

## 2020-08-26 DIAGNOSIS — I5033 Acute on chronic diastolic (congestive) heart failure: Secondary | ICD-10-CM | POA: Diagnosis not present

## 2020-08-26 DIAGNOSIS — J45909 Unspecified asthma, uncomplicated: Secondary | ICD-10-CM | POA: Diagnosis not present

## 2020-08-26 DIAGNOSIS — E1122 Type 2 diabetes mellitus with diabetic chronic kidney disease: Secondary | ICD-10-CM | POA: Insufficient documentation

## 2020-08-26 DIAGNOSIS — R519 Headache, unspecified: Secondary | ICD-10-CM

## 2020-08-26 DIAGNOSIS — Q670 Congenital facial asymmetry: Secondary | ICD-10-CM | POA: Diagnosis not present

## 2020-08-26 DIAGNOSIS — I130 Hypertensive heart and chronic kidney disease with heart failure and stage 1 through stage 4 chronic kidney disease, or unspecified chronic kidney disease: Secondary | ICD-10-CM | POA: Insufficient documentation

## 2020-08-26 DIAGNOSIS — Z79899 Other long term (current) drug therapy: Secondary | ICD-10-CM | POA: Insufficient documentation

## 2020-08-26 DIAGNOSIS — R252 Cramp and spasm: Secondary | ICD-10-CM

## 2020-08-26 DIAGNOSIS — R1310 Dysphagia, unspecified: Secondary | ICD-10-CM | POA: Diagnosis not present

## 2020-08-26 DIAGNOSIS — K047 Periapical abscess without sinus: Secondary | ICD-10-CM

## 2020-08-26 DIAGNOSIS — K0889 Other specified disorders of teeth and supporting structures: Secondary | ICD-10-CM | POA: Diagnosis present

## 2020-08-26 LAB — CBC WITH DIFFERENTIAL/PLATELET
Abs Immature Granulocytes: 0.07 10*3/uL (ref 0.00–0.07)
Basophils Absolute: 0.1 10*3/uL (ref 0.0–0.1)
Basophils Relative: 1 %
Eosinophils Absolute: 0.1 10*3/uL (ref 0.0–0.5)
Eosinophils Relative: 1 %
HCT: 35.1 % — ABNORMAL LOW (ref 36.0–46.0)
Hemoglobin: 11.5 g/dL — ABNORMAL LOW (ref 12.0–15.0)
Immature Granulocytes: 1 %
Lymphocytes Relative: 19 %
Lymphs Abs: 1.9 10*3/uL (ref 0.7–4.0)
MCH: 29.4 pg (ref 26.0–34.0)
MCHC: 32.8 g/dL (ref 30.0–36.0)
MCV: 89.8 fL (ref 80.0–100.0)
Monocytes Absolute: 1.4 10*3/uL — ABNORMAL HIGH (ref 0.1–1.0)
Monocytes Relative: 14 %
Neutro Abs: 6.6 10*3/uL (ref 1.7–7.7)
Neutrophils Relative %: 64 %
Platelets: 189 10*3/uL (ref 150–400)
RBC: 3.91 MIL/uL (ref 3.87–5.11)
RDW: 19.5 % — ABNORMAL HIGH (ref 11.5–15.5)
WBC: 10.2 10*3/uL (ref 4.0–10.5)
nRBC: 0 % (ref 0.0–0.2)

## 2020-08-26 LAB — BASIC METABOLIC PANEL
Anion gap: 14 (ref 5–15)
BUN: 21 mg/dL (ref 8–23)
CO2: 30 mmol/L (ref 22–32)
Calcium: 9.5 mg/dL (ref 8.9–10.3)
Chloride: 88 mmol/L — ABNORMAL LOW (ref 98–111)
Creatinine, Ser: 4.44 mg/dL — ABNORMAL HIGH (ref 0.44–1.00)
GFR, Estimated: 10 mL/min — ABNORMAL LOW (ref >60)
Glucose, Bld: 130 mg/dL — ABNORMAL HIGH (ref 70–99)
Potassium: 3.3 mmol/L — ABNORMAL LOW (ref 3.5–5.1)
Sodium: 132 mmol/L — ABNORMAL LOW (ref 135–145)

## 2020-08-26 MED ORDER — SODIUM CHLORIDE 0.9 % IV BOLUS: 500.0000 mL | Freq: Once | INTRAVENOUS | Status: DC

## 2020-08-26 MED ORDER — CLINDAMYCIN HCL 150 MG PO CAPS: 150.0000 mg | ORAL_CAPSULE | Freq: Four times a day (QID) | ORAL | 0 refills | Status: DC

## 2020-08-26 MED ORDER — ACETAMINOPHEN 325 MG PO TABS
650.0000 mg | ORAL_TABLET | Freq: Once | ORAL | Status: AC
  Administered 2020-08-26: 650 mg via ORAL

## 2020-08-26 MED ORDER — OXYCODONE-ACETAMINOPHEN 5-325 MG PO TABS: 1.0000 | ORAL_TABLET | Freq: Four times a day (QID) | ORAL | 0 refills | Status: DC | PRN

## 2020-08-26 MED ORDER — ACETAMINOPHEN 325 MG PO TABS
ORAL_TABLET | ORAL | Status: AC
  Filled 2020-08-26: qty 2

## 2020-08-26 MED ORDER — CLINDAMYCIN PHOSPHATE 600 MG/50ML IV SOLN
600.0000 mg | Freq: Once | INTRAVENOUS | Status: AC
  Administered 2020-08-26: 600 mg via INTRAVENOUS
  Filled 2020-08-26: qty 50

## 2020-08-26 MED ORDER — IBUPROFEN 400 MG PO TABS
400.0000 mg | ORAL_TABLET | Freq: Once | ORAL | Status: DC
  Filled 2020-08-26: qty 1

## 2020-08-26 MED ORDER — OXYCODONE-ACETAMINOPHEN 5-325 MG PO TABS
1.0000 | ORAL_TABLET | Freq: Once | ORAL | Status: AC
  Administered 2020-08-26: 1 via ORAL
  Filled 2020-08-26: qty 1

## 2020-08-26 MED ORDER — LIDOCAINE-EPINEPHRINE (PF) 2 %-1:200000 IJ SOLN
20.0000 mL | Freq: Once | INTRAMUSCULAR | Status: AC
  Administered 2020-08-26: 20 mL
  Filled 2020-08-26: qty 20

## 2020-08-26 MED ORDER — ACETAMINOPHEN 325 MG PO TABS
650.0000 mg | ORAL_TABLET | Freq: Once | ORAL | Status: DC
  Filled 2020-08-26: qty 2

## 2020-08-26 NOTE — Progress Notes (Signed)
Virtual Visit via Video Note  I connected with Morgan Clay  on 08/26/20 at  4:20 PM EDT by a video enabled telemedicine application and verified that I am speaking with the correct person using two identifiers.  Location patient: home, [place] Location provider:work or home office Persons participating in the virtual visit: patient, provider  I discussed the limitations of evaluation and management by telemedicine and the availability of in person appointments. The patient expressed understanding and agreed to proceed.   HPI:  Acute telemedicine visit for facial discomfort: -Onset: the last several days -Symptoms include: severe shooting pains in the face on the R, facial distortion, weakness in face on R, drooling, difficulty opening mouth fully and difficulty swallowing -she thought this was perhaps a dental infection -Denies: fevers, purulence around teeth, weakness or numbness elsewhere -Pertinent past medical history: see below -Pertinent medication allergies: No Known Allergies   ROS: See pertinent positives and negatives per HPI.  Past Medical History:  Diagnosis Date  . A-fib ([place])    on Eliquis  . AICD (automatic cardioverter/defibrillator) present 10/06/2015  . Anemia   . Angiodysplasia of small intestine ([place]) 06/10/2020   Ileum - seen on capsule endoscopy 05/2020 - ablated at colonoscopy  . ARF (acute renal failure) ([place])   . Arthritis    "qwhere" (01/03/2018)  . Asthma    reports mild asthma since childhood - had COPD on dx list from prior PCP  . Chronic bronchitis ([place])    "get it most years; not this past year though" (01/03/2018)  . Chronic kidney disease    "? stage;  followed by [facility]; [name]" (01/03/2018)  . Chronic systolic congestive heart failure ([place])   . COVID-19 05/03/2020  . DEPRESSION 12/17/2009   Annotation: PHQ-9 score = 14 done on 12/17/2009 Qualifier: Diagnosis of  By: Hassell Done FNP, Tori Milks    . Diabetes mellitus without complication (HCC)     DIET CONTROLLED   . Diverticulosis   . Enteritis   . FIBROIDS, UTERUS 03/05/2008  . GI bleeding 01/03/2018  . Glaucoma   . Gout   . Hepatitis C    HEPATITIS C - s/p treatment with Harvoni, saw hepatology, Dawn Drazek  . History of blood transfusion ~ 11/2017  . Hyperlipemia 12/06/2012  . HYPERTENSION, BENIGN 04/24/2007  . Hypothyroidism   . LBBB (left bundle branch block)   . Lower GI bleeding    "been dealing w/it since 07/2017" (01/03/2018)  . Nonischemic cardiomyopathy ([place])   . OBESITY 05/27/2009  . OBSTRUCTIVE SLEEP APNEA 11/14/2007   no CPAP  . OSTEOPENIA 09/30/2008  . Pain and swelling of left upper extremity 08/08/2017  . Personal history of other infectious and parasitic disease    Hepatitis B  . Pneumonia    "several times" (01/03/2018)  . Pulmonary edema, acute ([place]) 08/09/2017    Past Surgical History:  Procedure Laterality Date  . APPENDECTOMY    . AV FISTULA PLACEMENT Left 08/05/2020   Procedure: LEFT UPPER EXTREMITY ARTERIOVENOUS (AV) FISTULA CREATION;  Surgeon: Cherre Robins, MD;  Location: [place];  Service: Vascular;  Laterality: Left;  . CARDIAC CATHETERIZATION    . CARDIOVERSION N/A 12/14/2014   Procedure: CARDIOVERSION;  Surgeon: Dorothy Spark, MD;  Location: [facility] ENDOSCOPY;  Service: Cardiovascular;  Laterality: N/A;  . CARDIOVERSION N/A 02/26/2017   Procedure: CARDIOVERSION;  Surgeon: Evans Lance, MD;  Location: [place] CV LAB;  Service: Cardiovascular;  Laterality: N/A;  . CARDIOVERSION N/A 07/24/2017   Procedure: CARDIOVERSION;  Surgeon: Acie Fredrickson Wonda Cheng, MD;  Location: [facility] ENDOSCOPY;  Service: Cardiovascular;  Laterality: N/A;  . CARDIOVERSION N/A 02/12/2020   Procedure: CARDIOVERSION;  Surgeon: [name], MD;  Location: [facility] ENDOSCOPY;  Service: Cardiovascular;  Laterality: N/A;  . COLONOSCOPY N/A 11/08/2017   Procedure: COLONOSCOPY;  Surgeon: Milus Banister, MD;  Location: [facility] ENDOSCOPY;  Service: Endoscopy;  Laterality: N/A;  . COLONOSCOPY WITH  PROPOFOL N/A 05/31/2020   Procedure: COLONOSCOPY WITH PROPOFOL;  Surgeon: Irene Shipper, MD;  Location: [facility] ENDOSCOPY;  Service: Endoscopy;  Laterality: N/A;  . DILATION AND CURETTAGE OF UTERUS    . ENTEROSCOPY N/A 03/02/2020   Procedure: ENTEROSCOPY;  Surgeon: Yetta Flock, MD;  Location: [facility] ENDOSCOPY;  Service: Gastroenterology;  Laterality: N/A;  . EP IMPLANTABLE DEVICE N/A 10/06/2015   Procedure: BIV ICD Generator Changeout;  Surgeon: [name], MD;  Location: [place] CV LAB;  Service: Cardiovascular;  Laterality: N/A;  . ESOPHAGOGASTRODUODENOSCOPY N/A 10/26/2017   Procedure: ESOPHAGOGASTRODUODENOSCOPY (EGD);  Surgeon: Ladene Artist, MD;  Location: [facility] ENDOS[facility];  Service: Endoscopy;  Laterality: N/A;  . ESOPHAGOGASTRODUODENOSCOPY (EGD) WITH PROPOFOL N/A 11/07/2017   Procedure: ESOPHAGOGASTRODUODENOSCOPY (EGD) WITH PROPOFOL;  Surgeon: Milus Banister, MD;  Location: [facility] ENDOSCOPY;  Service: Endoscopy;  Laterality: N/A;  . ESOPHAGOGASTRODUODENOSCOPY (EGD) WITH PROPOFOL N/A 05/29/2020   Procedure: ESOPHAGOGASTRODUODENOSCOPY (EGD) WITH PROPOFOL;  Surgeon: Doran Stabler, MD;  Location: [place];  Service: Gastroenterology;  Laterality: N/A;  . GIVENS CAPSULE STUDY N/A 05/19/2020   Procedure: GIVENS CAPSULE STUDY;  Surgeon: [name], MD;  Location: [place];  Service: Endoscopy;  Laterality: N/A;  .adm for obs since pacemaker, PA wil enter order and see pt  . GIVENS CAPSULE STUDY N/A 05/29/2020   Procedure: GIVENS CAPSULE STUDY;  Surgeon: Doran Stabler, MD;  Location: [place];  Service: Gastroenterology;  Laterality: N/A;  . HOT HEMOSTASIS N/A 10/26/2017   Procedure: HOT HEMOSTASIS (ARGON PLASMA COAGULATION/BICAP);  Surgeon: Ladene Artist, MD;  Location: [facility] ENDOSCOPY;  Service: Endoscopy;  Laterality: N/A;  . HOT HEMOSTASIS N/A 03/02/2020   Procedure: HOT HEMOSTASIS (ARGON PLASMA COAGULATION/BICAP);  Surgeon: Yetta Flock, MD;  Location: [facility]  ENDOSCOPY;  Service: Gastroenterology;  Laterality: N/A;  . HOT HEMOSTASIS N/A 05/31/2020   Procedure: HOT HEMOSTASIS (ARGON PLASMA COAGULATION/BICAP);  Surgeon: Irene Shipper, MD;  Location: [facility] ENDOSCOPY;  Service: Endoscopy;  Laterality: N/A;  . INSERT / REPLACE / REMOVE PACEMAKER  ?2008  . IR PERC TUN PERIT CATH WO PORT S&I [facility]  07/05/2020  . POLYPECTOMY  11/08/2017   Procedure: POLYPECTOMY;  Surgeon: Milus Banister, MD;  Location: [facility] ENDOSCOPY;  Service: Endoscopy;;  . PROCTOSCOPY N/A 05/22/2018   Procedure: RIGID PROCTOSCOPY;  Surgeon: Michael Boston, MD;  Location: [facility] ORS;  Service: General;  Laterality: N/A;  . RIGHT HEART CATH N/A 02/13/2018   Procedure: RIGHT HEART CATH;  Surgeon: [name], MD;  Location: [place] CV LAB;  Service: Cardiovascular;  Laterality: N/A;  . RIGHT HEART CATH N/A 06/23/2020   Procedure: RIGHT HEART CATH;  Surgeon: [name], MD;  Location: [place] CV LAB;  Service: Cardiovascular;  Laterality: N/A;  . RIGHT/LEFT HEART CATH AND CORONARY ANGIOGRAPHY N/A 12/11/2019   Procedure: RIGHT/LEFT HEART CATH AND CORONARY ANGIOGRAPHY;  Surgeon: [name], MD;  Location: [place] CV LAB;  Service: Cardiovascular;  Laterality: N/A;  . SUBMUCOSAL TATTOO INJECTION  03/02/2020   Procedure: SUBMUCOSAL TATTOO INJECTION;  Surgeon: Yetta Flock,  MD;  Location: [place];  Service: Gastroenterology;;  . [place] INJECTION  05/31/2020   Procedure: SUBMUCOSAL TATTOO INJECTION;  Surgeon: Irene Shipper, MD;  Location: [place];  Service: Endoscopy;;  . TUBAL LIGATION    . XI ROBOTIC ASSISTED LOWER ANTERIOR RESECTION N/A 05/22/2018   Procedure: XI ROBOTIC ASSISTED SIGMOID COLOECTOMY MOBILIZATION OF SPLENIC FLEXURE, FIREFLY ASSESSMENT OF PERFUSION;  Surgeon: Michael Boston, MD;  Location: [facility] ORS;  Service: General;  Laterality: N/A;  ERAS PATHWAY     Current Outpatient Medications:  .  acetaminophen (TYLENOL) 325 MG tablet, Take 325 mg by  mouth every 6 (six) hours as needed for moderate pain or headache., Disp: , Rfl:  .  allopurinol (ZYLOPRIM) 100 MG tablet, Take 1 tablet by mouth once daily, Disp: 30 tablet, Rfl: 11 .  amiodarone (PACERONE) 200 MG tablet, Take 1 tablet (200 mg total) by mouth daily., Disp: 90 tablet, Rfl: 3 .  carvedilol (COREG) 3.125 MG tablet, Take 1 tablet (3.125 mg total) by mouth 2 (two) times daily with a meal., Disp: 60 tablet, Rfl: 5 .  cromolyn (OPTICROM) 4 % ophthalmic solution, Place 1 drop into both eyes 2 (two) times daily., Disp: , Rfl:  .  gabapentin (NEURONTIN) 300 MG capsule, Take 1 capsule (300 mg total) by mouth daily., Disp: 180 capsule, Rfl: 2 .  isosorbide-hydrALAZINE (BIDIL) 20-37.5 MG tablet, Take 0.5 tablets by mouth 3 (three) times daily., Disp: 30 tablet, Rfl: 5 .  levothyroxine (SYNTHROID) 137 MCG tablet, Take 1 tablet (137 mcg total) by mouth daily before breakfast., Disp: 90 tablet, Rfl: 3 .  MIRALAX 17 g packet, Take 1 packet by mouth daily as needed for constipation., Disp: , Rfl:  .  nitroGLYCERIN (NITROSTAT) 0.4 MG SL tablet, Place 0.4 mg under the tongue every 5 (five) minutes x 3 doses as needed for chest pain. , Disp: , Rfl:  .  oxyCODONE-acetaminophen (PERCOCET) 5-325 MG tablet, Take 1 tablet by mouth every 4 (four) hours as needed for severe pain., Disp: 12 tablet, Rfl: 0 .  pantoprazole (PROTONIX) 40 MG tablet, Take 1 tablet (40 mg total) by mouth daily., Disp: 30 tablet, Rfl: 11 .  traZODone (DESYREL) 50 MG tablet, Take 50 mg by mouth at bedtime., Disp: , Rfl:   EXAM:  VITALS per patient if applicable:  GENERAL: alert, oriented, appears well and in no acute distress  HEENT: atraumatic, conjunttiva clear, distortion of face with drawing of lower face/lips/nose towards the L, ? Edema vs distortion - difficulty to asses well of video visit - she has difficulty opening her mouth, she reports weakness in the R side of the face and difficulty with swallowing  NECK: normal  movements of the head and neck  LUNGS: on inspection no signs of respiratory distress, breathing rate appears normal, no obvious gross SOB, gasping or wheezing  CV: no obvious cyanosis  MS: moves all visible extremities without noticeable abnormality  PSYCH/NEURO: pleasant and cooperative, no obvious depression or anxiety, speech and thought processing grossly intact  ASSESSMENT AND PLAN:  Discussed the following assessment and plan:  Facial asymmetry  Facial pain  Dysphagia, unspecified type  Trismus  -we discussed possible serious and likely etiologies, options for evaluation and workup, limitations of telemedicine visit vs in person visit, treatment, treatment risks and precautions.  Given the severity of the symptoms she is describing facial asymmetry with trismus, advised prompt in person evaluation at a higher level of care.  She prefers to go to a med  center near her house rather than to the hospital and agrees to go right away.  She prefers to go by private vehicle, her daughter plans to drive her.   I discussed the assessment and treatment plan with the patient. The patient was provided an opportunity to ask questions and all were answered. The patient agreed with the plan and demonstrated an understanding of the instructions.     [name], DO

## 2020-08-26 NOTE — ED Provider Notes (Signed)
[facility] EMERGENCY DEPARTMENT Provider Note   CSN: [id number] Arrival date & time: 08/26/20  1706     History Chief Complaint  Patient presents with  . Dental Pain    Morgan Clay is a 75 y.o. female.  She is here with a complaint of right-sided facial pain and swelling for 2 days.  No trauma.  Low-grade fevers here.  Patient denies any dental pain.  No difficulty swallowing.  The history is provided by the patient.  Dental Pain Location:  Lower Lower teeth location:  26/RL lateral incisor Quality:  Throbbing Severity:  Severe Onset quality:  Gradual Timing:  Constant Progression:  Worsening Chronicity:  New Context: abscess and poor dentition   Relieved by:  Nothing Worsened by:  Jaw movement, touching and pressure Ineffective treatments:  None tried Associated symptoms: facial pain, facial swelling and fever   Associated symptoms: no difficulty swallowing, no headaches, no neck pain and no neck swelling        Past Medical History:  Diagnosis Date  . A-fib ([place])    on Eliquis  . AICD (automatic cardioverter/defibrillator) present 10/06/2015  . Anemia   . Angiodysplasia of small intestine ([place]) 06/10/2020   Ileum - seen on capsule endoscopy 05/2020 - ablated at colonoscopy  . ARF (acute renal failure) ([place])   . Arthritis    "qwhere" (01/03/2018)  . Asthma    reports mild asthma since childhood - had COPD on dx list from prior PCP  . Chronic bronchitis ([place])    "get it most years; not this past year though" (01/03/2018)  . Chronic kidney disease    "? stage;  followed by [facility]; [name]" (01/03/2018)  . Chronic systolic congestive heart failure ([place])   . COVID-19 05/03/2020  . DEPRESSION 12/17/2009   Annotation: PHQ-9 score = 14 done on 12/17/2009 Qualifier: Diagnosis of  By: Hassell Done FNP, Tori Milks    . Diabetes mellitus without complication (HCC)    DIET CONTROLLED   . Diverticulosis   . Enteritis   . FIBROIDS, UTERUS 03/05/2008  . GI  bleeding 01/03/2018  . Glaucoma   . Gout   . Hepatitis C    HEPATITIS C - s/p treatment with Harvoni, saw hepatology, Dawn Drazek  . History of blood transfusion ~ 11/2017  . Hyperlipemia 12/06/2012  . HYPERTENSION, BENIGN 04/24/2007  . Hypothyroidism   . LBBB (left bundle branch block)   . Lower GI bleeding    "been dealing w/it since 07/2017" (01/03/2018)  . Nonischemic cardiomyopathy ([place])   . OBESITY 05/27/2009  . OBSTRUCTIVE SLEEP APNEA 11/14/2007   no CPAP  . OSTEOPENIA 09/30/2008  . Pain and swelling of left upper extremity 08/08/2017  . Personal history of other infectious and parasitic disease    Hepatitis B  . Pneumonia    "several times" (01/03/2018)  . Pulmonary edema, acute ([place]) 08/09/2017    Patient Active Problem List   Diagnosis Date Noted  . Malnutrition of moderate degree 07/09/2020  . Angiodysplasia of small intestine ([place]) 06/10/2020  . Acute on chronic diastolic CHF (congestive heart failure) ([place])   . COVID-19 virus infection 05/27/2020  . Anemia due to chronic blood loss 04/15/2020  . Overweight (BMI 25.0-29.9) 03/02/2020  . Long term current use of anticoagulant   . Persistent atrial fibrillation ([place]) 02/06/2020  . Secondary hypercoagulable state ([place]) 02/06/2020  . CHF (congestive heart failure) ([place]) 07/23/2019  . 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 (HCV) ([place])   . Gout   .  Anemia in chronic kidney disease 01/18/2018  . Pulmonary hypertension ([place]) on echocardiogram 01/14/2018  . AVM (arteriovenous malformation) of small bowel, acquired 01/14/2018  . Atrial fibrillation, chronic 10/25/2017  . Aortic atherosclerosis ([place]) 08/30/2017  . Diverticulitis of left colon status post robotic low anterior to sigmoid resection 05/22/2018 07/31/2017  . Hypertension associated with diabetes ([place]) 06/07/2017  . MDD (major depressive disorder), recurrent, in partial remission ([place]) 06/07/2017  . Asthma 05/22/2017  . Chronic obstructive pulmonary disease  ([place])   . Atypical atrial flutter ([place]) 09/28/2015  . Type 2 diabetes, controlled, with renal manifestation ([place]) 11/24/2013  . CKD (chronic kidney disease) stage 4, GFR 15-29 ml/min  11/24/2013  . Nonischemic cardiomyopathy ([place]) 11/22/2010  . Obesity 05/27/2009  . OSTEOPENIA 09/30/2008  . FIBROIDS, UTERUS 03/05/2008  . OBSTRUCTIVE SLEEP APNEA 11/14/2007  . Chronic diastolic CHF (congestive heart failure) ([place]) 04/24/2007  . Hypothyroidism 01/28/2007  . Biventricular ICD (implantable cardioverter-defibrillator) in place 01/08/2007    Past Surgical History:  Procedure Laterality Date  . APPENDECTOMY    . AV FISTULA PLACEMENT Left 08/05/2020   Procedure: LEFT UPPER EXTREMITY ARTERIOVENOUS (AV) FISTULA CREATION;  Surgeon: Cherre Robins, MD;  Location: [facility];  Service: Vascular;  Laterality: Left;  . CARDIAC CATHETERIZATION    . CARDIOVERSION N/A 12/14/2014   Procedure: CARDIOVERSION;  Surgeon: Dorothy Spark, MD;  Location: [facility] ENDOSCOPY;  Service: Cardiovascular;  Laterality: N/A;  . CARDIOVERSION N/A 02/26/2017   Procedure: CARDIOVERSION;  Surgeon: Evans Lance, MD;  Location: [place] CV LAB;  Service: Cardiovascular;  Laterality: N/A;  . CARDIOVERSION N/A 07/24/2017   Procedure: CARDIOVERSION;  Surgeon: Thayer Headings, MD;  Location: [facility] ENDOSCOPY;  Service: Cardiovascular;  Laterality: N/A;  . CARDIOVERSION N/A 02/12/2020   Procedure: CARDIOVERSION;  Surgeon: [name], MD;  Location: [facility] ENDOSCOPY;  Service: Cardiovascular;  Laterality: N/A;  . COLONOSCOPY N/A 11/08/2017   Procedure: COLONOSCOPY;  Surgeon: Milus Banister, MD;  Location: [facility] ENDOSCOPY;  Service: Endoscopy;  Laterality: N/A;  . COLONOSCOPY WITH PROPOFOL N/A 05/31/2020   Procedure: COLONOSCOPY WITH PROPOFOL;  Surgeon: Irene Shipper, MD;  Location: [facility] ENDOSCOPY;  Service: Endoscopy;  Laterality: N/A;  . DILATION AND CURETTAGE OF UTERUS    . ENTEROSCOPY N/A 03/02/2020   Procedure: ENTEROSCOPY;  Surgeon:  Yetta Flock, MD;  Location: [facility] ENDOSCOPY;  Service: Gastroenterology;  Laterality: N/A;  . EP IMPLANTABLE DEVICE N/A 10/06/2015   Procedure: BIV ICD Generator Changeout;  Surgeon: [name], MD;  Location: [place] CV LAB;  Service: Cardiovascular;  Laterality: N/A;  . ESOPHAGOGASTRODUODENOSCOPY N/A 10/26/2017   Procedure: ESOPHAGOGASTRODUODENOSCOPY (EGD);  Surgeon: Ladene Artist, MD;  Location[facility];  Service: Endoscopy;  Laterality: N/A;  . ESOPHAGOGASTRODUODENOSCOPY (EGD) WITH PROPOFOL N/A 11/07/2017   Procedure: ESOPHAGOGASTRODUODENOSCOPY (EGD) WITH PROPOFOL;  Surgeon: Milus Banister, MD;  [facility]: [facility] ENDOSCOPY;  Service: Endoscopy;  Laterality: N/A;  . ESOPHAGOGASTRODUODENOSCOPY (EGD) WITH PROPOFOL N/A 05/29/2020   Procedure: ESOPHAGOGASTRODUODENOSCOPY (EGD) WITH PROPOFOL;  Surgeon: Doran Stabler, MD;  Location: [facility];  Service: Gastroenterology;  Laterality: N/A;  . GIVENS CAPSULE STUDY N/A 05/19/2020   Procedure: GIVENS CAPSULE STUDY;  Surgeon: [name], MD;  Location: [facility];  Service: Endoscopy;  Laterality: N/A;  .adm for obs since pacemaker, PA wil enter order and see pt  . GIVENS CAPSULE STUDY N/A 05/29/2020   Procedure: GIVENS CAPSULE STUDY;  Surgeon: Doran Stabler, MD;  Location: [facility];  Service: Gastroenterology;  Laterality: N/A;  . HOT HEMOSTASIS N/A 10/26/2017   Procedure: HOT HEMOSTASIS (ARGON PLASMA COAGULATION/BICAP);  Surgeon: Ladene Artist, MD;  Location: [facility];  Service: Endoscopy;  Laterality: N/A;  . HOT HEMOSTASIS N/A 03/02/2020   Procedure: HOT HEMOSTASIS (ARGON PLASMA COAGULATION/BICAP);  Surgeon: Yetta Flock, MD;  Location: [facility] ENDOSCOPY;  Service: Gastroenterology;  Laterality: N/A;  . HOT HEMOSTASIS N/A 05/31/2020   Procedure: HOT HEMOSTASIS (ARGON PLASMA COAGULATION/BICAP);  Surgeon: Irene Shipper, MD;  Location: [facility] ENDOSCOPY;  Service: Endoscopy;  Laterality: N/A;  . INSERT / REPLACE /  REMOVE PACEMAKER  ?2008  . IR PERC TUN PERIT CATH WO PORT S&I Dartha Lodge  07/05/2020  . POLYPECTOMY  11/08/2017   Procedure: POLYPECTOMY;  Surgeon: Milus Banister, MD;  Location: [facility] END[facility];  Service: Endoscopy;;  . PROCTOSCOPY N/A 05/22/2018   Procedure: RIGID PROCTOSCOPY;  Surgeon: Diavian Furgason [place], MD;  [facility]: [facility] ORS;  Service: General;  Laterality: N/A;  . RIGHT HEART CATH N/A 02/13/2018   Procedure: RIGHT HEART CATH;  Surgeon: [name], MD;  Location: [place] CV LAB;  Service: Cardiovascular;  Laterality: N/A;  . RIGHT HEART CATH N/A 06/23/2020   Procedure: RIGHT HEART CATH;  Surgeon: [name], MD;  Location: [place] CV LAB;  Service: Cardiovascular;  Laterality: N/A;  . RIGHT/LEFT HEART CATH AND CORONARY ANGIOGRAPHY N/A 12/11/2019   Procedure: RIGHT/LEFT HEART CATH AND CORONARY ANGIOGRAPHY;  Surgeon: [name], MD;  Location: [place] CV LAB;  Service: Cardiovascular;  Laterality: N/A;  . SUBMUCOSAL TATTOO INJECTION  03/02/2020   Procedure: SUBMUCOSAL TATTOO INJECTION;  Surgeon: Yetta Flock, MD;  Location: [facility] ENDOSCOPY;  Service: Gastroenterology;;  . Lia Foyer TATTOO INJECTION  05/31/2020   Procedure: SUBMUCOSAL TATTOO INJECTION;  Surgeon: Irene Shipper, MD;  Location: [facility];  Service: Endoscopy;;  . TUBAL LIGATION    . XI ROBOTIC ASSISTED LOWER ANTERIOR RESECTION N/A 05/22/2018   Procedure: XI ROBOTIC ASSISTED SIGMOID COLOECTOMY MOBILIZATION OF SPLENIC FLEXURE, FIREFLY ASSESSMENT OF PERFUSION;  Surgeon: Posey Jasmin [place], MD;  Location: [facility] ORS;  Service: General;  Laterality: N/A;  ERAS PATHWAY     OB History   No obstetric history on file.     Family History  Problem Relation Age of Onset  . Asthma Father   . Heart attack Father   . Asthma Sister   . Lung cancer Sister   . Heart attack Mother   . Stroke Brother   . Colon cancer Neg Hx   . Esophageal cancer Neg Hx   . Pancreatic cancer Neg Hx   . Stomach cancer Neg Hx   . Liver disease  Neg Hx     Social History   Tobacco Use  . Smoking status: Never Smoker  . Smokeless tobacco: Never Used  Vaping Use  . Vaping Use: Never used  Substance Use Topics  . Alcohol use: Not Currently    Comment: 01/03/2018 "couple drinks/month"  . Drug use: Yes    Types: Marijuana    Comment: VERY INTERMITTENT     Home Medications Prior to Admission medications   Medication Sig Start Date End Date Taking? Authorizing Provider  acetaminophen (TYLENOL) 325 MG tablet Take 325 mg by mouth every 6 (six) hours as needed for moderate pain or headache.    [provider]  allopurinol (ZYLOPRIM) 100 MG tablet Take 1 tablet by mouth once daily 08/03/20   [name], MD  amiodarone (PACERONE) 200 MG tablet Take 1 tablet (200  mg total) by mouth daily. 07/27/20   Clegg, [name] D, NP  carvedilol (COREG) 3.125 MG tablet Take 1 tablet (3.125 mg total) by mouth 2 (two) times daily with a meal. 07/12/20   Lyda Jester M, PA-C  cromolyn (OPTICROM) 4 % ophthalmic solution Place 1 drop into both eyes 2 (two) times daily. 06/14/20   [provider]  gabapentin (NEURONTIN) 300 MG capsule Take 1 capsule (300 mg total) by mouth daily. 07/12/20   Lyda Jester M, PA-C  isosorbide-hydrALAZINE (BIDIL) 20-37.5 MG tablet Take 0.5 tablets by mouth 3 (three) times daily. 07/12/20   Consuelo Pandy, PA-C  levothyroxine (SYNTHROID) 137 MCG tablet Take 1 tablet (137 mcg total) by mouth daily before breakfast. 03/11/20   Billie Ruddy, MD  MIRALAX 17 g packet Take 1 packet by mouth daily as needed for constipation. 07/21/20   [provider]  nitroGLYCERIN (NITROSTAT) 0.4 MG SL tablet Place 0.4 mg under the tongue every 5 (five) minutes x 3 doses as needed for chest pain.     [provider]  oxyCODONE-acetaminophen (PERCOCET) 5-325 MG tablet Take 1 tablet by mouth every 4 (four) hours as needed for severe pain. 08/05/20 08/05/21  Baglia, Corrina, PA-C  pantoprazole (PROTONIX) 40 MG  tablet Take 1 tablet (40 mg total) by mouth daily. 04/06/20 04/06/21  Clegg, [name] D, NP  traZODone (DESYREL) 50 MG tablet Take 50 mg by mouth at bedtime.    [provider]    Allergies    Patient has no known allergies.  Review of Systems   Review of Systems  Constitutional: Positive for fever.  HENT: Positive for dental problem and facial swelling. Negative for trouble swallowing.   Eyes: Negative for visual disturbance.  Respiratory: Negative for cough.   Cardiovascular: Negative for chest pain.  Gastrointestinal: Negative for abdominal pain.  Genitourinary: Negative for dysuria.  Musculoskeletal: Negative for neck pain.  Skin: Negative for rash.  Neurological: Negative for headaches.    Physical Exam Updated Vital Signs BP (!) 139/50   Pulse 79   Temp (!) 100.5 F (38.1 C) (Oral)   Resp 16   Ht 5\' 1"  (1.549 m)   Wt 58 kg   SpO2 98%   BMI 24.16 kg/m   Physical Exam Constitutional:      Appearance: Normal appearance. She is well-developed.  HENT:     Head: Normocephalic and atraumatic.     Mouth/Throat:     Comments: She has very poor dentition.  There are some swelling over her right jaw and some fluctuance and tenderness inside the mouth along the gingival border. Eyes:     Conjunctiva/sclera: Conjunctivae normal.  Cardiovascular:     Rate and Rhythm: Normal rate and regular rhythm.  Pulmonary:     Effort: Pulmonary effort is normal.     Breath sounds: Normal breath sounds.  Abdominal:     Tenderness: There is no abdominal tenderness. There is no guarding.  Musculoskeletal:        General: No deformity or signs of injury. Normal range of motion.     Cervical back: Neck supple.  Skin:    General: Skin is warm and dry.  Neurological:     General: No focal deficit present.     Mental Status: She is alert.     GCS: GCS eye subscore is 4. GCS verbal subscore is 5. GCS motor subscore is 6.     ED Results / Procedures / Treatments   Labs (all labs  ordered are listed, but only abnormal results are displayed) Labs Reviewed  BASIC METABOLIC PANEL - Abnormal; Notable for the following components:      Result Value   Sodium 132 (*)    Potassium 3.3 (*)    Chloride 88 (*)    Glucose, Bld 130 (*)    Creatinine, Ser 4.44 (*)    GFR, Estimated 10 (*)    All other components within normal limits  CBC WITH DIFFERENTIAL/PLATELET - Abnormal; Notable for the following components:   Hemoglobin 11.5 (*)    HCT 35.1 (*)    RDW 19.5 (*)    Monocytes Absolute 1.4 (*)    All other components within normal limits    EKG None  Radiology No results found.  Procedures .Marland KitchenIncision and Drainage  Date/Time: 08/26/2020 7:09 PM Performed by: Hayden Rasmussen, MD Authorized by: Hayden Rasmussen, MD   Consent:    Consent obtained:  Verbal   Consent given by:  Patient   Risks discussed:  Bleeding, incomplete drainage, pain and infection   Alternatives discussed:  No treatment, delayed treatment and referral Universal protocol:    Procedure explained and questions answered to patient or proxy's satisfaction: yes     Patient identity confirmed:  Verbally with patient Location:    Type:  Abscess   Size:  3   Location:  Mouth   Mouth location:  Alveolar process Anesthesia:    Anesthesia method:  Local infiltration   Local anesthetic:  Lidocaine 2% WITH epi Procedure type:    Complexity:  Complex Procedure details:    Incision types:  Stab incision   Wound management:  Probed and deloculated   Drainage:  Bloody and purulent   Drainage amount:  Scant   Wound treatment:  Wound left open   Packing materials:  None Post-procedure details:    Procedure completion:  Tolerated well, no immediate complications     Medications Ordered in ED Medications  acetaminophen (TYLENOL) tablet 650 mg (650 mg Oral Given 08/26/20 1719)  clindamycin (CLEOCIN) IVPB 600 mg (0 mg Intravenous Stopped 08/26/20 1849)  oxyCODONE-acetaminophen (PERCOCET/ROXICET)  5-325 MG per tablet 1 tablet (1 tablet Oral Given 08/26/20 1812)  lidocaine-EPINEPHrine (XYLOCAINE W/EPI) 2 %-1:200000 (PF) injection 20 mL (20 mLs Infiltration Given by Other 08/26/20 1910)    ED Course  I have reviewed the triage vital signs and the nursing notes.  Pertinent labs & imaging results that were available during my care of the patient were reviewed by me and considered in my medical decision making (see chart for details).    MDM Rules/Calculators/A&P                         Differential diagnosis includes dental infection, dental abscess, facial abscess, Sirs.  Performed local I&D with some expression of some pus.Patient given dose of IV antibiotics.  Prescriptions for oral antibiotics and close dental follow-up recommended.  Return instructions discussed.  Final Clinical Impression(s) / ED Diagnoses Final diagnoses:  Dental abscess    Rx / DC Orders ED Discharge Orders         Ordered    clindamycin (CLEOCIN) 150 MG capsule  Every 6 hours        08/26/20 1918    oxyCODONE-acetaminophen (PERCOCET) 5-325 MG tablet  Every 6 hours PRN        08/26/20 1918           Hayden Rasmussen, MD 08/27/20 [phone]

## 2020-08-26 NOTE — Discharge Instructions (Signed)
You are seen in the emergency department for facial pain and swelling.  You likely have a dental abscess.  We incised an area and gave you a dose of IV antibiotics.  We are sending you home with oral antibiotics and pain medication.  Please contact your dentist for follow-up as soon as possible.  Return to the emergency department if any worsening or concerning symptoms.

## 2020-08-26 NOTE — ED Triage Notes (Signed)
C/o dental  Pain and facial swelling x 2 days

## 2020-08-31 ENCOUNTER — Ambulatory Visit (HOSPITAL_COMMUNITY)
Admission: RE | Admit: 2020-08-31 | Discharge: 2020-08-31 | Disposition: A | Discharge status: Home / Self Care | Payer: Medicare Other | Source: Ambulatory Visit | Attending: Internal Medicine | Admitting: Internal Medicine

## 2020-08-31 ENCOUNTER — Other Ambulatory Visit: Payer: Self-pay

## 2020-08-31 DIAGNOSIS — K7469 Other cirrhosis of liver: Secondary | ICD-10-CM | POA: Insufficient documentation

## 2020-08-31 DIAGNOSIS — N186 End stage renal disease: Secondary | ICD-10-CM

## 2020-08-31 DIAGNOSIS — B1920 Unspecified viral hepatitis C without hepatic coma: Secondary | ICD-10-CM | POA: Diagnosis present

## 2020-08-31 DIAGNOSIS — Z992 Dependence on renal dialysis: Secondary | ICD-10-CM

## 2020-09-07 ENCOUNTER — Encounter (HOSPITAL_COMMUNITY): Payer: Self-pay | Admitting: Cardiology

## 2020-09-07 ENCOUNTER — Ambulatory Visit (HOSPITAL_COMMUNITY)
Admission: RE | Admit: 2020-09-07 | Discharge: 2020-09-07 | Disposition: A | Discharge status: Home / Self Care | Payer: Medicare Other | Source: Ambulatory Visit | Attending: Cardiology | Admitting: Cardiology

## 2020-09-07 ENCOUNTER — Other Ambulatory Visit: Payer: Self-pay

## 2020-09-07 VITALS — BP 130/60 | HR 65 | Wt 131.0 lb

## 2020-09-07 DIAGNOSIS — Z8711 Personal history of peptic ulcer disease: Secondary | ICD-10-CM | POA: Diagnosis not present

## 2020-09-07 DIAGNOSIS — I4892 Unspecified atrial flutter: Secondary | ICD-10-CM | POA: Insufficient documentation

## 2020-09-07 DIAGNOSIS — Z8249 Family history of ischemic heart disease and other diseases of the circulatory system: Secondary | ICD-10-CM | POA: Insufficient documentation

## 2020-09-07 DIAGNOSIS — D500 Iron deficiency anemia secondary to blood loss (chronic): Secondary | ICD-10-CM | POA: Diagnosis not present

## 2020-09-07 DIAGNOSIS — Z992 Dependence on renal dialysis: Secondary | ICD-10-CM | POA: Diagnosis not present

## 2020-09-07 DIAGNOSIS — I480 Paroxysmal atrial fibrillation: Secondary | ICD-10-CM

## 2020-09-07 DIAGNOSIS — I484 Atypical atrial flutter: Secondary | ICD-10-CM

## 2020-09-07 DIAGNOSIS — I5032 Chronic diastolic (congestive) heart failure: Secondary | ICD-10-CM | POA: Insufficient documentation

## 2020-09-07 DIAGNOSIS — I2510 Atherosclerotic heart disease of native coronary artery without angina pectoris: Secondary | ICD-10-CM | POA: Diagnosis not present

## 2020-09-07 DIAGNOSIS — Z79899 Other long term (current) drug therapy: Secondary | ICD-10-CM | POA: Insufficient documentation

## 2020-09-07 DIAGNOSIS — I428 Other cardiomyopathies: Secondary | ICD-10-CM | POA: Diagnosis not present

## 2020-09-07 DIAGNOSIS — K7460 Unspecified cirrhosis of liver: Secondary | ICD-10-CM | POA: Insufficient documentation

## 2020-09-07 DIAGNOSIS — I132 Hypertensive heart and chronic kidney disease with heart failure and with stage 5 chronic kidney disease, or end stage renal disease: Secondary | ICD-10-CM | POA: Insufficient documentation

## 2020-09-07 DIAGNOSIS — E1122 Type 2 diabetes mellitus with diabetic chronic kidney disease: Secondary | ICD-10-CM | POA: Insufficient documentation

## 2020-09-07 DIAGNOSIS — N186 End stage renal disease: Secondary | ICD-10-CM | POA: Insufficient documentation

## 2020-09-07 DIAGNOSIS — Z79890 Hormone replacement therapy (postmenopausal): Secondary | ICD-10-CM | POA: Diagnosis not present

## 2020-09-07 LAB — CBC
HCT: 34.9 % — ABNORMAL LOW (ref 36.0–46.0)
Hemoglobin: 11.4 g/dL — ABNORMAL LOW (ref 12.0–15.0)
MCH: 29.6 pg (ref 26.0–34.0)
MCHC: 32.7 g/dL (ref 30.0–36.0)
MCV: 90.6 fL (ref 80.0–100.0)
Platelets: 200 10*3/uL (ref 150–400)
RBC: 3.85 MIL/uL — ABNORMAL LOW (ref 3.87–5.11)
RDW: 18.9 % — ABNORMAL HIGH (ref 11.5–15.5)
WBC: 5.7 10*3/uL (ref 4.0–10.5)
nRBC: 0 % (ref 0.0–0.2)

## 2020-09-07 LAB — COMPREHENSIVE METABOLIC PANEL
ALT: 14 U/L (ref 0–44)
AST: 25 U/L (ref 15–41)
Albumin: 4.1 g/dL (ref 3.5–5.0)
Alkaline Phosphatase: 97 U/L (ref 38–126)
Anion gap: 12 (ref 5–15)
BUN: 20 mg/dL (ref 8–23)
CO2: 33 mmol/L — ABNORMAL HIGH (ref 22–32)
Calcium: 9.8 mg/dL (ref 8.9–10.3)
Chloride: 90 mmol/L — ABNORMAL LOW (ref 98–111)
Creatinine, Ser: 4.82 mg/dL — ABNORMAL HIGH (ref 0.44–1.00)
GFR, Estimated: 9 mL/min — ABNORMAL LOW (ref >60)
Glucose, Bld: 101 mg/dL — ABNORMAL HIGH (ref 70–99)
Potassium: 3.9 mmol/L (ref 3.5–5.1)
Sodium: 135 mmol/L (ref 135–145)
Total Bilirubin: 0.3 mg/dL (ref 0.3–1.2)
Total Protein: 8.4 g/dL — ABNORMAL HIGH (ref 6.5–8.1)

## 2020-09-07 LAB — TSH: TSH: 2.21 u[IU]/mL (ref 0.350–4.500)

## 2020-09-07 NOTE — Patient Instructions (Signed)
It was great to see you today! No medication changes are needed at this time.  Labs today We will only contact you if something comes back abnormal or we need to make some changes. Otherwise no news is good news!  You have been referred to [facility] Dr Quentin Ore -they will be in contact with an appt  Your physician recommends that you schedule a follow-up appointment in: 6 months with Dr [name]  Do the following things EVERYDAY: 1) Weigh yourself in the morning before breakfast. Write it down and keep it in a log. 2) Take your medicines as prescribed 3) Eat low salt foods--Limit salt (sodium) to 2000 mg per day.  4) Stay as active as you can everyday 5) Limit all fluids for the day to less than 2 liters  At the [facility], you and your health needs are our priority. As part of our continuing mission to provide you with exceptional heart care, we have created designated Provider Care Teams. These Care Teams include your primary Cardiologist (physician) and Advanced Practice Providers (APPs- Physician Assistants and Nurse Practitioners) who all work together to provide you with the care you need, when you need it.   You may see any of the following providers on your designated Care Team at your next follow up: [name] Dr Glori Bickers . Dr Loralie Champagne . Dr Vickki Muff . Darrick Grinder, NP . Lyda Jester, [place] . Audry Riles, PharmD   Please be sure to bring in all your medications bottles to every appointment.   If you have any questions or concerns before your next appointment please send [name] a message through [place] or call our office at [phone].    TO LEAVE A MESSAGE FOR THE NURSE SELECT OPTION 2, PLEASE LEAVE A MESSAGE INCLUDING: . YOUR NAME . DATE OF BIRTH . CALL BACK NUMBER . REASON FOR CALL**this is important as we prioritize the call backs  YOU WILL RECEIVE A CALL BACK THE SAME DAY AS LONG AS YOU CALL BEFORE 4:00  PM

## 2020-09-08 ENCOUNTER — Encounter (HOSPITAL_COMMUNITY): Payer: Medicare Other

## 2020-09-08 ENCOUNTER — Telehealth: Payer: Self-pay

## 2020-09-08 NOTE — Telephone Encounter (Signed)
Attempted to call patient to send manual transmission. No answer, unable to leave VM d/t not set up yet. Will attempt to contact at later date and time.

## 2020-09-08 NOTE — Progress Notes (Signed)
PCP: Dr. Colin Benton Cardiology: Dr. Caryl Comes HF Cardiology: Dr. [name]  75 y.o. with history of nonischemic cardiomyopathy with recovery of EF with BiV pacing, recurrent GI bleeding, and paroxysmal atrial flutter was referred by Dr. Caryl Comes for evaluation of CHF.  Patient has a history of presumed nonischemic cardiomyopathy for a number of years.  Echo in 12/15 showed EF 25-30% and Cardiolite at that time showed no ischemia.  She had Medtronic CRT-D system placed, and EF recovered. In 4/19, echo showed EF 60-65%.    She has additionally had problems with GI bleeding.  She had been on Eliquis for atrial flutter but this was stopped with recurrent GI bleeding.  She had bleeding from a gastric ulcer but was also noted to have multiple small bowel AVMs on capsule endoscopy out of reach of endoscope.  Eliquis was stopped. She has had no melena or BRBPR off Eliquis.   Echo in 10/19 showed EF 55-60%, mild-moderate MR, moderate AI, moderate RV dilation.   She was admitted in 1/20 with diverticulitis and ended up having a sigmoid colectomy.    Echo in 9/20 showed EF lower at 45-50%, moderate AI.   She had LHC/RHC in 8/21 with nonobstructive CAD, normal filling pressures, and preserved cardiac output. Echo 8/21 showed EF 50-55%, mild LVH, mildly decreased RV systolic function, moderate aortic insufficiency.  Back in in A fib the end of October. Amio was increased and DC-CV was set up for November 19th.  Chemically converted so DC-CV canceled.    Seen in HF Clinic in 10/21 and was more fatigued. Found to be in atrial flutter but hgb also 6.9. She was on Eliquis at the time. Loaded on amio and converted to NSR. Patient underwent small bowel enteroscopy 03/02/2020 with findings trace esophageal varix, small superficial AVMs prepyloric stomach with other punctate vascular lesions treated with APC, benign duodenal polyp. Etiology likely secondary to jejunal AVM versus early GAVE vsportal gastropathy.Patient  underwent small bowel enteroscopy 03/02/2020 with findings trace esophageal varix, small superficial AVMs prepyloric stomach with other punctate vascular lesions treated with APC, benign duodenal polyp. Etiology likely secondary to jejunal AVM versus early GAVE vsportal gastropathy. Eliquis restarted.   Hgb dropped back down to 7.7 in 12/21. Saw [name] and capsule placed but got stuck in the stomach.   Had volume overload on device in 05/24/20 and torsemide increased by Dr. [name]   She was admitted on 05/27/20 with dizziness, and a fall preceded by melena found to have further GI bleeding hgb of 5.1 and an AKI.  Eliquis was held and she was transfused 2u PRBC's.  Repeat pill endoscope showed bleeding in distal small bowel.  Colonoscopy found no bleeding but large avm in cecum argon lasered.  She has been off anticoagulants.   Admitted 06/23/20 from [place] with markedly elevated filling pressures. Aggressively diuresed but had worsening renal function and progressed to ESRD. Nephrology consulted. She tolerated HD well and is on MWF schedule.  Echo in 2/22 showed EF 55-60%, low normal RV systolic function.   She returns for followup of CHF.  Weight is stable.  Breathing is better now that she is on dialysis.  No dyspnea walking on flat ground.  No orthopnea/PND.  No recent BRBPR/melena. No orthopnea/PND.  She remains off anticoagulation due to recurrent GI bleeding.   Medtronic device interrogation: >99% BiV pacing, fluid index just over threshold but impedance trending up.   Labs (9/19):  Hgb 10.2, K 3.8, creatinine 2.5 Labs (02/26/2018): K 3.4, creatinine  2.2, WBC 76.  Labs (2/20): K 3.9, creatinine 1.8 Labs (8/20): K 3.8, creatinine 1.98 Labs (3/21): LDL 33 Labs (5/21): K 3.6, creatinine 1.69 Labs (7/21): K 4, creatinine 1.94 Labs (9/21): K 4.5, creatinine 2.24, TSH normal, HFTs normal  Labs (11/21): K 4.2 Creatinine 3.05  Labs (1/22): K 4.0, creatinine 2.22 Labs (2/22): K 4.1,  creatinine 2.54  ECG (personally reviewed): NSR, BiV paced  PMH: 1. Diabetes: diet-controlled.  2. Gout 3. HCV: Treated with Harvoni.  4. Atrial flutter: Atypical.  Had DCCV in 3/19, maintained on amiodarone.  5. HTN 6. ESRD: MWF HD 7. H/o SBO 8. Obesity 9. Nonischemic cardiomyopathy: Echo in 12/15 with EF 25-30%.  Cardiolite in 12/15 with no ischemia.  She had a Medtronic CRT-D device placed with recovery of EF.   - Echo (4/19): EF 60-65% with moderate AI, moderate MR, and PASP 49 mmHg.  - RHC 02/13/2018 RA mean 6, RV 49/7,PA 50/18, mean 31,PCWP mean 20, Oxygen saturations:PA 67% AO 97%, Cardiac Output (Fick) 4.6, Cardiac Index (Fick) 2.9, PVR 2.4 WU - Echo (10/19): EF 55-60%, mild-moderate MR, moderate AI, moderate RV dilation, PASP 60 mmHg.  - PYP scan (3/20): H/CL 1.3, grade 1-2 visually.  Unlikely transthyretin cardiac amyloidosis.  - Echo (9/20): EF 45-50%, moderate aortic insufficiency.  - LHC/RHC (8/21): 40% pLAD, 50% D1; mean RA 3, PA 41/16, mean PCWP 14, CI 2.74, PVR 2.99.  - Echo (8/21): EF 50-55%, mild LVH, grade 2 diastolic dysfunction, normal RV size with mildly decreased systolic function, moderate AI, no AS - Echo 07/2020  EF 55-60%, moderate LVH, D-shaped interventricular septum, RV moderately dilated with low normal function, severe biatrial enlargement, severe TR.   - RHC 06/2020:  RA mean 21 RV 49/20 PA 59/24, mean 39 PCWP mean 26 Cardiac Output (Fick) 5.07 Cardiac Index (Fick) 3.23 PVR 2.6 WU Cardiac Output (Thermo) 4.72 Cardiac Index (Thermo) 3.01 PVR 2.75 WU PAPI 1.7.  10. GI bleeding: Gastric AVM and also gastric ulcer seen in past.  - 8/19 capsule endoscopy showed small bowel AVMs beyond the reach of endoscopy.  - With recurrent episodes of GI bleeding, Eliquis was stopped.  - Capsule study 1/22 showed bleeding in distal small bowel.  Colonoscopy found no bleeding but large avm in cecum argon lasered.  11. Fe deficiency anemia.  12. Depression. 13. OSA: Uses CPAP.   14. Diverticulitis: Sigmoid colectomy in 1/20.   ROS: All systems reviewed and negative except as per HPI.   Social History   Socioeconomic History  . Marital status: Divorced    Spouse name: Not on file  . Number of children: 1  . Years of education: 26  . Highest education level: Not on file  Occupational History  . Not on file  Tobacco Use  . Smoking status: Never Smoker  . Smokeless tobacco: Never Used  Vaping Use  . Vaping Use: Never used  Substance and Sexual Activity  . Alcohol use: Not Currently    Comment: 01/03/2018 "couple drinks/month"  . Drug use: Yes    Types: Marijuana    Comment: VERY INTERMITTENT   . Sexual activity: Not Currently  Other Topics Concern  . Not on file  Social History Narrative   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      Home Situation: lives alone      Spiritual Beliefs: Reading      Lifestyle: no regular exercise; poor diet      She is divorced at least one  adult child   Never smoker    rare alcohol to occasional at best    no substance abuse         Social Determinants of Health   Financial Resource Strain: Low Risk   . Difficulty of Paying Living Expenses: Not hard at all  Food Insecurity: No Food Insecurity  . Worried About Charity fundraiser in the Last Year: Never true  . Ran Out of Food in the Last Year: Never true  Transportation Needs: No Transportation Needs  . Lack of Transportation (Medical): No  . Lack of Transportation (Non-Medical): No  Physical Activity: Not on file  Stress: Not on file  Social Connections: Not on file  Intimate Partner Violence: Not on file   Family History  Problem Relation Age of Onset  . Asthma Father   . Heart attack Father   . Asthma Sister   . Lung cancer Sister   . Heart attack Mother   . Stroke Brother   . Colon cancer Neg Hx   . Esophageal cancer Neg Hx   . Pancreatic cancer Neg Hx   . Stomach cancer Neg Hx   . Liver disease Neg Hx     Current  Outpatient Medications  Medication Sig Dispense Refill  . acetaminophen (TYLENOL) 325 MG tablet Take 325 mg by mouth every 6 (six) hours as needed for moderate pain or headache.    . allopurinol (ZYLOPRIM) 100 MG tablet Take 1 tablet by mouth once daily 30 tablet 11  . amiodarone (PACERONE) 200 MG tablet Take 1 tablet (200 mg total) by mouth daily. 90 tablet 3  . carvedilol (COREG) 3.125 MG tablet Take 1 tablet (3.125 mg total) by mouth 2 (two) times daily with a meal. 60 tablet 5  . cromolyn (OPTICROM) 4 % ophthalmic solution Place 1 drop into both eyes 2 (two) times daily.    [name] gabapentin (NEURONTIN) 300 MG capsule Take 1 capsule (300 mg total) by mouth daily. 180 capsule 2  . isosorbide-hydrALAZINE (BIDIL) 20-37.5 MG tablet Take 0.5 tablets by mouth 3 (three) times daily. 30 tablet 5  . levothyroxine (SYNTHROID) 137 MCG tablet Take 1 tablet (137 mcg total) by mouth daily before breakfast. 90 tablet 3  . MIRALAX 17 g packet Take 1 packet by mouth daily as needed for constipation.    . nitroGLYCERIN (NITROSTAT) 0.4 MG SL tablet Place 0.4 mg under the tongue every 5 (five) minutes x 3 doses as needed for chest pain.     [name] oxyCODONE-acetaminophen (PERCOCET) 5-325 MG tablet Take 1 tablet by mouth every 6 (six) hours as needed for severe pain. 10 tablet 0  . traZODone (DESYREL) 50 MG tablet Take 50 mg by mouth at bedtime.     No current facility-administered medications for this encounter.   BP 130/60   Pulse 65   Wt 59.4 kg (131 lb)   SpO2 100%   BMI 24.75 kg/m   Wt Readings from Last 3 Encounters:  09/07/20 59.4 kg (131 lb)  08/26/20 58 kg (127 lb 13.9 oz)  08/05/20 57.2 kg (126 lb 1.7 oz)   PHYSICAL EXAM:  General: NAD Neck: No JVD, no thyromegaly or thyroid nodule.  Lungs: Clear to auscultation bilaterally with normal respiratory effort. CV: Nondisplaced PMI.  Heart regular S1/S2, no S3/S4, no murmur.  No peripheral edema.  No carotid bruit.  Normal pedal pulses.  Abdomen: Soft,  nontender, no hepatosplenomegaly, no distention.  Skin: Intact without lesions or rashes.  Neurologic: Alert  and oriented x 3.  Psych: Normal affect. Extremities: No clubbing or cyanosis.  HEENT: Normal.   Assessment/Plan: 1. NICM, recovered, now with primarily diastolic CHF:  Echo in 3/76 showed EF improved to 60-65% with MDT BiV pacing. Echo in 10/19 showed stable EF 55-60% with mild-moderate MR, moderate AI. PYP scan in 3/20 was equivocal for transthyretin amyloidosis.  Echo in 9/20 showed EF back down to 45-50%. LHC/RHC in 8/21 showed nonobstructive CAD, normal filling pressures and cardiac output. Last echo 06/2020 EF was 55-60% a with grade II DD and RV low normal. Volume now managed by HD, NYHA class II symptoms and not volume overloaded on exam.  - HD for volume management.   - Continue Bidil 1/2  tab tid.  - Continue Coreg  3.125 mg twice a day.   2. Atrial fibrillation/flutter: Paroxysmal.  Rare short AF runs on device interrogation.  She is not on anticoagulation due to multiple GI bleeds.  Currently no evidence for overt bleeding.   - Continue amiodarone, check LFTs and TSH.  She had a recent eye exam.  - Refer to Dr. Quentin Ore for [facility] evaluation. I think she could get anticoagulation short term peri-[place].  3. ESRD: Now on HD M-W-F 4. Anemia, chronic blood loss: Long history multiple bleeding events while on Eliquis and around 3 endoscopy/colonoscopies over the last 3 years. Sigmoid colectomy for recurrent diverticulitis. Eliquis has been stopped and started back in the past.  Cecal avm ablated most recently.  - As above, will leave off Eliquis for now and work up for [employer].  5. Cirrhosis: S/p treatment with Harvoni for Hep C, followed by GI outpatient.      Followup 6 months with me, refer to Dr. Quentin Ore for [place].   Loralie Champagne 09/08/2020

## 2020-09-08 NOTE — Telephone Encounter (Signed)
-----   Message from [name], MD sent at 09/08/2020 11:34 AM EDT ----- Device remote reviewed. Remote is normal.  Battery status is good.  Lead measurements unchanged.  Histograms are appropriate.   LADIES  CAN YOU REMOTE AND SEE IF SHE STILL IS IN ATRIAL FIB  Thanks SK

## 2020-09-09 ENCOUNTER — Other Ambulatory Visit: Payer: Self-pay

## 2020-09-09 ENCOUNTER — Ambulatory Visit (INDEPENDENT_AMBULATORY_CARE_PROVIDER_SITE_OTHER): Payer: Medicare Other | Admitting: Physician Assistant

## 2020-09-09 ENCOUNTER — Ambulatory Visit (HOSPITAL_COMMUNITY)
Admission: RE | Admit: 2020-09-09 | Discharge: 2020-09-09 | Disposition: A | Discharge status: Home / Self Care | Payer: Medicare Other | Source: Ambulatory Visit | Attending: Vascular Surgery | Admitting: Vascular Surgery

## 2020-09-09 VITALS — BP 105/61 | HR 72 | Temp 97.6°F | Resp 20 | Ht 61.0 in | Wt 131.0 lb

## 2020-09-09 DIAGNOSIS — N186 End stage renal disease: Secondary | ICD-10-CM | POA: Insufficient documentation

## 2020-09-09 DIAGNOSIS — Z992 Dependence on renal dialysis: Secondary | ICD-10-CM

## 2020-09-09 NOTE — H&P (View-Only) (Signed)
POST OPERATIVE OFFICE NOTE    CC:  F/u for surgery  HPI:  This is a 75 y.o. female who is s/p 1st stage left BVT on 08/05/2020 by Dr. Stanford Breed and now on HD.   She had TDC placed by IR on 07/05/2020.  Pt states she does not have pain/numbness in her left hand.  She is new to dialysis as of this year.    The pt is on dialysis M/W/F at [place] location.   No Known Allergies  Current Outpatient Medications  Medication Sig Dispense Refill  . acetaminophen (TYLENOL) 325 MG tablet Take 325 mg by mouth every 6 (six) hours as needed for moderate pain or headache.    . allopurinol (ZYLOPRIM) 100 MG tablet Take 1 tablet by mouth once daily 30 tablet 11  . amiodarone (PACERONE) 200 MG tablet Take 1 tablet (200 mg total) by mouth daily. 90 tablet 3  . carvedilol (COREG) 3.125 MG tablet Take 1 tablet (3.125 mg total) by mouth 2 (two) times daily with a meal. 60 tablet 5  . cromolyn (OPTICROM) 4 % ophthalmic solution Place 1 drop into both eyes 2 (two) times daily.    [name] gabapentin (NEURONTIN) 300 MG capsule Take 1 capsule (300 mg total) by mouth daily. 180 capsule 2  . isosorbide-hydrALAZINE (BIDIL) 20-37.5 MG tablet Take 0.5 tablets by mouth 3 (three) times daily. 30 tablet 5  . levothyroxine (SYNTHROID) 137 MCG tablet Take 1 tablet (137 mcg total) by mouth daily before breakfast. 90 tablet 3  . MIRALAX 17 g packet Take 1 packet by mouth daily as needed for constipation.    . nitroGLYCERIN (NITROSTAT) 0.4 MG SL tablet Place 0.4 mg under the tongue every 5 (five) minutes x 3 doses as needed for chest pain.     [name] oxyCODONE-acetaminophen (PERCOCET) 5-325 MG tablet Take 1 tablet by mouth every 6 (six) hours as needed for severe pain. 10 tablet 0  . traZODone (DESYREL) 50 MG tablet Take 50 mg by mouth at bedtime.     No current facility-administered medications for this visit.     ROS:  See HPI  Physical Exam:  Today's Vitals   09/09/20 1412  BP: 105/61  Pulse: 72  Resp: 20  Temp: 97.6 F  (36.4 C)  TempSrc: Temporal  SpO2: 100%  Weight: 131 lb (59.4 kg)  Height: 5\' 1"  (1.549 m)   Body mass index is 24.75 kg/m.   Incision:  Healed nicely Extremities:   There is a palpable faintly palpable left radial pulse.   Motor and sensory are in tact.   There is a thrill/bruit present.  The fistula/graft is easily palpable distally   Dialysis Duplex on 09/09/2020: Diameter:  0.52-0.67 cm Depth:  0.82-1.50cm   Assessment/Plan:  This is a 75 y.o. female who is s/p: 1st stage left BVT on 08/05/2020 by Dr. Stanford Breed   -the pt does not have evidence of steal. -fistula is maturing nicely and will plan for 2nd stage left BVT with Dr. Stanford Breed.   -I did discuss with pt that her access will fail at some point and will need intervention or new access.  She expressed understanding.  Also went over steal syndrome with pt.  Questions were answered.  -discussed that her catheter can be removed after her fistula has been used several times and the dialysis center is happy with how it is functioning.      Leontine Locket, [facility] [phone]  Clinic MD:  Oneida Alar

## 2020-09-09 NOTE — Telephone Encounter (Signed)
The patient was not home but did agreed to send a manual transmission when she gets home.

## 2020-09-09 NOTE — Progress Notes (Signed)
POST OPERATIVE OFFICE NOTE    CC:  F/u for surgery  HPI:  This is a 75 y.o. female who is s/p 1st stage left BVT on 08/05/2020 by Dr. Stanford Breed and now on HD.   She had TDC placed by IR on 07/05/2020.  Pt states she does not have pain/numbness in her left hand.  She is new to dialysis as of this year.    The pt is on dialysis M/W/F at [place] location.   No Known Allergies  Current Outpatient Medications  Medication Sig Dispense Refill  . acetaminophen (TYLENOL) 325 MG tablet Take 325 mg by mouth every 6 (six) hours as needed for moderate pain or headache.    . allopurinol (ZYLOPRIM) 100 MG tablet Take 1 tablet by mouth once daily 30 tablet 11  . amiodarone (PACERONE) 200 MG tablet Take 1 tablet (200 mg total) by mouth daily. 90 tablet 3  . carvedilol (COREG) 3.125 MG tablet Take 1 tablet (3.125 mg total) by mouth 2 (two) times daily with a meal. 60 tablet 5  . cromolyn (OPTICROM) 4 % ophthalmic solution Place 1 drop into both eyes 2 (two) times daily.    [name] gabapentin (NEURONTIN) 300 MG capsule Take 1 capsule (300 mg total) by mouth daily. 180 capsule 2  . isosorbide-hydrALAZINE (BIDIL) 20-37.5 MG tablet Take 0.5 tablets by mouth 3 (three) times daily. 30 tablet 5  . levothyroxine (SYNTHROID) 137 MCG tablet Take 1 tablet (137 mcg total) by mouth daily before breakfast. 90 tablet 3  . MIRALAX 17 g packet Take 1 packet by mouth daily as needed for constipation.    . nitroGLYCERIN (NITROSTAT) 0.4 MG SL tablet Place 0.4 mg under the tongue every 5 (five) minutes x 3 doses as needed for chest pain.     [name] oxyCODONE-acetaminophen (PERCOCET) 5-325 MG tablet Take 1 tablet by mouth every 6 (six) hours as needed for severe pain. 10 tablet 0  . traZODone (DESYREL) 50 MG tablet Take 50 mg by mouth at bedtime.     No current facility-administered medications for this visit.     ROS:  See HPI  Physical Exam:  Today's Vitals   09/09/20 1412  BP: 105/61  Pulse: 72  Resp: 20  Temp: 97.6 F  (36.4 C)  TempSrc: Temporal  SpO2: 100%  Weight: 131 lb (59.4 kg)  Height: 5\' 1"  (1.549 m)   Body mass index is 24.75 kg/m.   Incision:  Healed nicely Extremities:   There is a palpable faintly palpable left radial pulse.   Motor and sensory are in tact.   There is a thrill/bruit present.  The fistula/graft is easily palpable distally   Dialysis Duplex on 09/09/2020: Diameter:  0.52-0.67 cm Depth:  0.82-1.50cm   Assessment/Plan:  This is a 75 y.o. female who is s/p: 1st stage left BVT on 08/05/2020 by Dr. Stanford Breed   -the pt does not have evidence of steal. -fistula is maturing nicely and will plan for 2nd stage left BVT with Dr. Stanford Breed.   -I did discuss with pt that her access will fail at some point and will need intervention or new access.  She expressed understanding.  Also went over steal syndrome with pt.  Questions were answered.  -discussed that her catheter can be removed after her fistula has been used several times and the dialysis center is happy with how it is functioning.      Leontine Locket, [facility] [phone]  Clinic MD:  Oneida Alar

## 2020-09-14 ENCOUNTER — Ambulatory Visit (INDEPENDENT_AMBULATORY_CARE_PROVIDER_SITE_OTHER): Payer: Medicare Other | Admitting: Internal Medicine

## 2020-09-14 ENCOUNTER — Other Ambulatory Visit (INDEPENDENT_AMBULATORY_CARE_PROVIDER_SITE_OTHER): Payer: Medicare Other

## 2020-09-14 ENCOUNTER — Other Ambulatory Visit (HOSPITAL_COMMUNITY)
Admission: RE | Admit: 2020-09-14 | Discharge: 2020-09-14 | Disposition: A | Discharge status: Home / Self Care | Payer: Medicare Other | Source: Ambulatory Visit | Attending: Vascular Surgery | Admitting: Vascular Surgery

## 2020-09-14 ENCOUNTER — Encounter: Payer: Self-pay | Admitting: Internal Medicine

## 2020-09-14 VITALS — BP 126/50 | HR 72 | Ht <= 58 in | Wt 133.2 lb

## 2020-09-14 DIAGNOSIS — B1920 Unspecified viral hepatitis C without hepatic coma: Secondary | ICD-10-CM | POA: Diagnosis not present

## 2020-09-14 DIAGNOSIS — Z01812 Encounter for preprocedural laboratory examination: Secondary | ICD-10-CM | POA: Diagnosis present

## 2020-09-14 DIAGNOSIS — M62838 Other muscle spasm: Secondary | ICD-10-CM

## 2020-09-14 DIAGNOSIS — K7469 Other cirrhosis of liver: Secondary | ICD-10-CM

## 2020-09-14 DIAGNOSIS — R109 Unspecified abdominal pain: Secondary | ICD-10-CM | POA: Diagnosis not present

## 2020-09-14 DIAGNOSIS — K558 Other vascular disorders of intestine: Secondary | ICD-10-CM

## 2020-09-14 DIAGNOSIS — K7460 Unspecified cirrhosis of liver: Secondary | ICD-10-CM

## 2020-09-14 DIAGNOSIS — Z20822 Contact with and (suspected) exposure to covid-19: Secondary | ICD-10-CM | POA: Insufficient documentation

## 2020-09-14 DIAGNOSIS — K5520 Angiodysplasia of colon without hemorrhage: Secondary | ICD-10-CM

## 2020-09-14 HISTORY — DX: Unspecified cirrhosis of liver: K74.60

## 2020-09-14 LAB — PROTIME-INR
INR: 1.1 ratio — ABNORMAL HIGH (ref 0.8–1.0)
Prothrombin Time: 12 s (ref 9.6–13.1)

## 2020-09-14 LAB — SARS CORONAVIRUS 2 (TAT 6-24 HRS): SARS Coronavirus 2: NEGATIVE

## 2020-09-14 MED ORDER — METHOCARBAMOL 500 MG PO TABS: 500.0000 mg | ORAL_TABLET | Freq: Two times a day (BID) | ORAL | 1 refills | Status: DC

## 2020-09-14 NOTE — Patient Instructions (Signed)
Your provider has requested that you go to the basement level for lab work before leaving today. Press "B" on the elevator. The lab is located at the first door on the left as you exit the elevator.  Due to recent changes in healthcare laws, you may see the results of your imaging and laboratory studies on MyChart before your provider has had a chance to review them.  We understand that in some cases there may be results that are confusing or concerning to you. Not all laboratory results come back in the same time frame and the provider may be waiting for multiple results in order to interpret others.  Please give [name] 48 hours in order for your provider to thoroughly review all the results before contacting the office for clarification of your results.   We have sent the following medications to your pharmacy for you to pick up at your convenience: Methocarbamol  Please follow up with Dr Carlean Purl in 6 months.   I appreciate the opportunity to care for you. Silvano Rusk, MD, [facility]

## 2020-09-14 NOTE — Progress Notes (Signed)
Morgan Clay 75 y.o. 1946/02/27 [id number]  Assessment & Plan:   Encounter Diagnoses  Name Primary?  . 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 (HCV) ([place]) Yes  . Angiodysplasia of small intestine ([place])   . Abdominal wall pain in left flank   . Muscle spasm    Cirrhosis still seems compensated and not causing significant problems.  Follow-up coags today.  Angiodysplasia was treated, she will have anemia follow-up through nephrology.  I think she has abdominal wall pain and some muscle spasms and we will try methocarbamol which should be okay with her dialysis.   Orders Placed This Encounter  Procedures  . Protime-INR    Lab Results  Component Value Date   INR 1.1 (H) 09/14/2020   INR 2.1 (H) 05/27/2020   INR 1.3 (H) 12/05/2019    Meds ordered this encounter  Medications  . methocarbamol (ROBAXIN) 500 MG tablet    Sig: Take 1 tablet (500 mg total) by mouth in the morning and at bedtime.    Dispense:  180 tablet    Refill:  1   Routine follow-up in 6 months sooner as needed  CC: Billie Ruddy, MD Dr. Vanetta Mulders   Subjective:   Chief Complaint: Left lower quadrant pain  HPI Morgan Clay is a 75 year old black woman with a history of angiodysplasia of the GI tract status post distal angiodysplastic ablation in the hospital earlier this year, anemia related to that and chronic kidney failure and compensated HCV cirrhosis.  She presents for follow-up today, she had had the terminal ileal AVM ablated by Dr. Henrene Pastor at colonoscopy on January 24.  She reports a left sided abdominal pain that is a spasm she says.  It can be seen to be contracting at times.  It is sometimes seen at dialysis but not specifically related to that.  She has not tried any specific therapies.  They can be quite painful.  She had had an EGD that in January admission which was negative and she had a capsule endoscope placed with that. No Known Allergies Current Meds   Medication Sig  . acetaminophen (TYLENOL) 325 MG tablet Take 650 mg by mouth every 6 (six) hours as needed for moderate pain or headache.  [name] amiodarone (PACERONE) 200 MG tablet Take 1 tablet (200 mg total) by mouth daily.  Lorin Picket 1 GM 210 MG(Fe) tablet Take 420 mg by mouth 3 (three) times daily.  . carvedilol (COREG) 3.125 MG tablet Take 1 tablet (3.125 mg total) by mouth 2 (two) times daily with a meal.  . cromolyn (OPTICROM) 4 % ophthalmic solution Place 1 drop into both eyes 2 (two) times daily.  [name] gabapentin (NEURONTIN) 300 MG capsule Take 1 capsule (300 mg total) by mouth daily.  [name] HYDROcodone-acetaminophen (NORCO) 10-325 MG tablet Take 1 tablet by mouth as needed.  . isosorbide-hydrALAZINE (BIDIL) 20-37.5 MG tablet Take 0.5 tablets by mouth 3 (three) times daily.  [name] levothyroxine (SYNTHROID) 137 MCG tablet Take 1 tablet (137 mcg total) by mouth daily before breakfast.  . MIRALAX 17 g packet Take 1 packet by mouth daily as needed for constipation.  [name] oxyCODONE-acetaminophen (PERCOCET) 5-325 MG tablet Take 1 tablet by mouth every 6 (six) hours as needed for severe pain.  . SENSIPAR 30 MG tablet SMARTSIG:1 Tablet(s) By Mouth Every Evening  . traZODone (DESYREL) 50 MG tablet Take 50 mg by mouth at bedtime.   Past Medical History:  Diagnosis Date  . A-fib ([place])    on  Eliquis  . AICD (automatic cardioverter/defibrillator) present 10/06/2015  . Anemia   . Angiodysplasia of small intestine ([place]) 06/10/2020   Ileum - seen on capsule endoscopy 05/2020 - ablated at colonoscopy  . ARF (acute renal failure) ([place])   . Arthritis    "qwhere" (01/03/2018)  . Asthma    reports mild asthma since childhood - had COPD on dx list from prior PCP  . Chronic bronchitis ([place])    "get it most years; not this past year though" (01/03/2018)  . Chronic kidney disease    "? stage;  followed by [facility]; [name]" (01/03/2018)  . Chronic systolic congestive heart failure ([place])   . COVID-19 05/03/2020   . DEPRESSION 12/17/2009   Annotation: PHQ-9 score = 14 done on 12/17/2009 Qualifier: Diagnosis of  By: Hassell Done FNP, Tori Milks    . Diabetes mellitus without complication (HCC)    DIET CONTROLLED   . Diverticulosis   . Enteritis   . FIBROIDS, UTERUS 03/05/2008  . GI bleeding 01/03/2018  . Glaucoma   . Gout   . Hepatitis C    HEPATITIS C - s/p treatment with Harvoni, saw hepatology, Dawn Drazek  . History of blood transfusion ~ 11/2017  . Hyperlipemia 12/06/2012  . HYPERTENSION, BENIGN 04/24/2007  . Hypothyroidism   . LBBB (left bundle branch block)   . Lower GI bleeding    "been dealing w/it since 07/2017" (01/03/2018)  . Nonischemic cardiomyopathy ([place])   . OBESITY 05/27/2009  . OBSTRUCTIVE SLEEP APNEA 11/14/2007   no CPAP  . OSTEOPENIA 09/30/2008  . Pain and swelling of left upper extremity 08/08/2017  . Personal history of other infectious and parasitic disease    Hepatitis B  . Pneumonia    "several times" (01/03/2018)  . Pulmonary edema, acute ([place]) 08/09/2017   Past Surgical History:  Procedure Laterality Date  . APPENDECTOMY    . AV FISTULA PLACEMENT Left 08/05/2020   Procedure: LEFT UPPER EXTREMITY ARTERIOVENOUS (AV) FISTULA CREATION;  Surgeon: Cherre Robins, MD;  Location: [place];  Service: Vascular;  Laterality: Left;  . CARDIAC CATHETERIZATION    . CARDIOVERSION N/A 12/14/2014   Procedure: CARDIOVERSION;  Surgeon: Dorothy Spark, MD;  [facility] END[facility]COPY;  Service: Cardiovascular;  Laterality: N/A;  . CARDIOVERSION N/A 02/26/2017   Procedure: CARDIOVERSION;  Surgeon: Evans Lance, MD;  Location: [place] CV LAB;  Service: Cardiovascular;  Laterality: N/A;  . CARDIOVERSION N/A 07/24/2017   Procedure: CARDIOVERSION;  Surgeon: Thayer Headings, MD;  Location: [facility] ENDOSCOPY;  Service: Cardiovascular;  Laterality: N/A;  . CARDIOVERSION N/A 02/12/2020   Procedure: CARDIOVERSION;  Surgeon: [name], MD;  Location: [facility] ENDOSCOPY;  Service: Cardiovascular;  Laterality:  N/A;  . COLONOSCOPY N/A 11/08/2017   Procedure: COLONOSCOPY;  Surgeon: Milus Banister, MD;  Location: [facility] ENDOSCOPY;  Service: Endoscopy;  Laterality: N/A;  . COLONOSCOPY WITH PROPOFOL N/A 05/31/2020   Procedure: COLONOSCOPY WITH PROPOFOL;  Surgeon: Irene Shipper, MD;  Location: [facility] ENDOSCOPY;  Service: Endoscopy;  Laterality: N/A;  . DILATION AND CURETTAGE OF UTERUS    . ENTEROSCOPY N/A 03/02/2020   Procedure: ENTEROSCOPY;  Surgeon: Yetta Flock, MD;  Location: [facility] ENDOSCOPY;  Service: Gastroenterology;  Laterality: N/A;  . EP IMPLANTABLE DEVICE N/A 10/06/2015   Procedure: BIV ICD Generator Changeout;  Surgeon: [name], MD;  Location: [place] CV LAB;  Service: Cardiovascular;  Laterality: N/A;  . ESOPHAGOGASTRODUODENOSCOPY N/A 10/26/2017   Procedure: ESOPHAGOGASTRODUODENOSCOPY (EGD);  Surgeon: Ladene Artist, MD;  Location: [facility] ENDOSCOPY;  Service: Endoscopy;  Laterality: N/A;  . ESOPHAGOGASTRODUODENOSCOPY (EGD) WITH PROPOFOL N/A 11/07/2017   Procedure: ESOPHAGOGASTRODUODENOSCOPY (EGD) WITH PROPOFOL;  Surgeon: Milus Banister, MD;  Location: [facility] ENDOSCOPY;  Service: Endoscopy;  Laterality: N/A;  . ESOPHAGOGASTRODUODENOSCOPY (EGD) WITH PROPOFOL N/A 05/29/2020   Procedure: ESOPHAGOGASTRODUODENOSCOPY (EGD) WITH PROPOFOL;  Surgeon: Doran Stabler, MD;  Location: [place];  Service: Gastroenterology;  Laterality: N/A;  . GIVENS CAPSULE STUDY N/A 05/19/2020   Procedure: GIVENS CAPSULE STUDY;  Surgeon: [name], MD;  Location: [place];  Service: Endoscopy;  Laterality: N/A;  .adm for obs since pacemaker, PA wil enter order and see pt  . GIVENS CAPSULE STUDY N/A 05/29/2020   Procedure: GIVENS CAPSULE STUDY;  Surgeon: Doran Stabler, MD;  Location: [place];  Service: Gastroenterology;  Laterality: N/A;  . HOT HEMOSTASIS N/A 10/26/2017   Procedure: HOT HEMOSTASIS (ARGON PLASMA COAGULATION/BICAP);  Surgeon: Ladene Artist, MD;  Location[facility];  Service:  Endoscopy;  Laterality: N/A;  . HOT HEMOSTASIS N/A 03/02/2020   Procedure: HOT HEMOSTASIS (ARGON PLASMA COAGULATION/BICAP);  Surgeon: Yetta Flock, MD;  Location: [facility] ENDOSCOPY;  Service: Gastroenterology;  Laterality: N/A;  . HOT HEMOSTASIS N/A 05/31/2020   Procedure: HOT HEMOSTASIS (ARGON PLASMA COAGULATION/BICAP);  Surgeon: Irene Shipper, MD;  Location: [facility] ENDOSCOPY;  Service: Endoscopy;  Laterality: N/A;  . INSERT / REPLACE / REMOVE PACEMAKER  ?2008  . IR PERC TUN PERIT CATH WO PORT S&I Dartha Lodge  07/05/2020  . POLYPECTOMY  11/08/2017   Procedure: POLYPECTOMY;  Surgeon: Milus Banister, MD;  Location: [facility] END[facility];  Service: Endoscopy;;  . PROCTOSCOPY N/A 05/22/2018   Procedure: RIGID PROCTOSCOPY;  Surgeon: Michael Boston, MD;  Location: [facility];  Service: General;  Laterality: N/A;  . RIGHT HEART CATH N/A 02/13/2018   Procedure: RIGHT HEART CATH;  Surgeon: [name], MD;  Location: [place] CV LAB;  Service: Cardiovascular;  Laterality: N/A;  . RIGHT HEART CATH N/A 06/23/2020   Procedure: RIGHT HEART CATH;  Surgeon: [name], MD;  Location: [place] CV LAB;  Service: Cardiovascular;  Laterality: N/A;  . RIGHT/LEFT HEART CATH AND CORONARY ANGIOGRAPHY N/A 12/11/2019   Procedure: RIGHT/LEFT HEART CATH AND CORONARY ANGIOGRAPHY;  Surgeon: [name], MD;  Location: [place] CV LAB;  Service: Cardiovascular;  Laterality: N/A;  . SUBMUCOSAL TATTOO INJECTION  03/02/2020   Procedure: SUBMUCOSAL TATTOO INJECTION;  Surgeon: Yetta Flock, MD;  Location: [facility] ENDOSCOPY;  Service: Gastroenterology;;  . Lia Foyer TATTOO INJECTION  05/31/2020   Procedure: SUBMUCOSAL TATTOO INJECTION;  Surgeon: Irene Shipper, MD;  Location: [place];  Service: Endoscopy;;  . TUBAL LIGATION    . XI ROBOTIC ASSISTED LOWER ANTERIOR RESECTION N/A 05/22/2018   Procedure: XI ROBOTIC ASSISTED SIGMOID COLOECTOMY MOBILIZATION OF SPLENIC FLEXURE, FIREFLY ASSESSMENT OF PERFUSION;  Surgeon: Michael Boston, MD;  Location: [facility];  Service: General;  Laterality: N/A;  ERAS PATHWAY   Social History   Social History Narrative   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      Home Situation: lives alone      Spiritual Beliefs: Christian - [employer]      Lifestyle: no regular exercise; poor diet      She is divorced at least one adult child   Never smoker    rare alcohol to occasional at best    no substance abuse         family history includes Asthma in  her father and sister; Heart attack in her father and mother; Lung cancer in her sister; Stroke in her brother.   Review of Systems As above  Objective:   Physical Exam BP (!) 126/50 (BP Location: Right Arm, Patient Position: Sitting, Cuff Size: Normal)   Pulse 72   Ht 4\' 10"  (1.473 m)   Wt 133 lb 4 oz (60.4 kg)   BMI 27.85 kg/m  NAD Lungs cta Cor NL s1s2 no rmg NL rhythm abd soft NT no HSM/mass

## 2020-09-15 ENCOUNTER — Encounter (HOSPITAL_COMMUNITY): Payer: Self-pay | Admitting: Vascular Surgery

## 2020-09-15 ENCOUNTER — Encounter: Payer: Self-pay | Admitting: Internal Medicine

## 2020-09-15 NOTE — Progress Notes (Signed)
Anesthesia Chart Review: Morgan Clay   Case: [id number] Date/Time: 09/16/20 0935   Procedure: LEFT SECOND STAGE BASCILIC VEIN TRANSPOSITION (Left ) - PERIPHERAL NERVE BLOCK   Anesthesia type: Monitor Anesthesia Care   Pre-op diagnosis: ESRD   Location: [facility] OR ROOM 16 / [facility]   Surgeons: Cherre Robins, MD      DISCUSSION: Patient is a 75 year old female scheduled for the above procedure. S/p right IJ [facility] 07/05/20. S/p left brachiobasilic AVF creation, first stage 08/05/20. Getting HD on MWF at [facility] location.  History includes never smoker, LBBB, nonischemic cardiomyopathy (04/2014; EF recovered after CRT-D/BiV pacing), CHF, AICD (generator replacement 10/06/15), afib/flutter (s/p DCCV 02/26/17, 07/24/17 & 02/12/20, recurrent afib 07/12/20), OSA (does not useCPAP), HLD, anemia, asthma,COPD, PVD,ESRD (started HD 07/06/20), hypothyroidism, depression, HTN, Hepatitis C (treated withHarvoni), DM2, glaucoma, diverticulitis (s/p sigmoid colectomy 05/22/18), GI bleed (HGB 5.5 in setting of Eliquis, s/p PRBC,05/29/20 capsule endoscopy showed distal small bowel AVMs; 05/31/20 colonoscopy showed tiny non-bleeding angiodysplastic ileal lesion s/p Argon beam coagualtion, Eliquis stopped). + COVID-19 05/03/20.   HF cardiology visit 09/07/20 with Dr. [name]. He notes, NICM, recovered, now with primarily diastolic CHF. Volume managed by HD. Rare short runs of AF on device interrogation. No anticoagulation due to multiple GI bleeds. Referring to Dr. Quentin Ore for [facility] evaluation. Six month HF follow-up planned.   Televisit with EP Dr. Caryl Comes on 07/29/20. Referred for Watchman device given afib and recent GI bleed. He wrote, "A shared decision has been made utilizing the [employer] of Cardiology shared decision tool to undergo Left Atrial Appendage Closure with Watchman device at this time.  Particularly in light of ESRD with high risk of bleeding and stroke, and with her hx of recurrent severe GI  bleeding  She will need short termanticoagulationand perhaps would have to be considered for very brief antiplatelet therapy...after full review of this patients clinical status, I feel that they are at moderate risk at this time." She is scheduled to see Lars Mage, MD with EP on 10/14/20 to further discuss.   Per 09/08/20 notation from EP Dr. Caryl Comes, "Device remote reviewed. Remote is normal.  Battery status is good.  Lead measurements unchanged.  Histograms are appropriate."  Palliative Care Consult 08/03/20 with Charlann Boxer, NP to discuss goals of care.   09/14/20 presurgical COVID-19 test negative.  Anesthesia team to evaluate on the day of surgery.   VS:  BP Readings from Last 3 Encounters:  09/14/20 (!) 126/50  09/09/20 105/61  09/07/20 130/60   Pulse Readings from Last 3 Encounters:  09/14/20 72  09/09/20 72  09/07/20 65    PROVIDERS: Billie Ruddy, MD is PCP Virl Axe, MD is EP cardiologist Loralie Champagne, MD is HF cardiologist [name], MD is nephrologist Silvano Rusk, MD is GI   LABS: For day of surgery. As of 09/07/20, H/H 11.4/34.9, glucose 101, Cr 4.82.   IMAGES: 1V PCXR 06/30/20 (during admission for CHF, worsening CKD, GI bleed): FINDINGS: There is persistent ill-defined opacity left lower lobe. Right lung is now clear. Cardiac enlargement is stable with pulmonary vascularity within normal limits. Pacemaker leads are attached to the right atrium, right ventricle, and coronary sinus. No adenopathy. There is aortic atherosclerosis. No bone lesions. IMPRESSION: - Persistent suspected pneumonia left lower lobe. Right lung now clear. No new areas of opacity compared to prior study. - Stable cardiac enlargement. Stable pacemaker lead placements. Aortic Atherosclerosis (ICD10-I70.0).   EKG: EKG 09/07/20:  Atrial-sensed ventricular-paced rhythm Biventricular  pacemaker detected No significant change since last tracing Confirmed by Dorris Carnes [phone]) on 09/07/2020 6:28:18 PM   CV: Echo 06/24/20: IMPRESSIONS  1. Left ventricular ejection fraction, by estimation, is 55 to 60%. The  left ventricle has normal function. The left ventricle has no regional  wall motion abnormalities. There is moderate concentric left ventricular  hypertrophy. Left ventricular  diastolic parameters are consistent with Grade II diastolic dysfunction  (pseudonormalization). Elevated left ventricular end-diastolic pressure.  There is the interventricular septum is flattened in systole and diastole,  consistent with right ventricular  pressure and volume overload.  2. Right ventricular systolic function is low normal. The right  ventricular size is moderately enlarged. There is mildly elevated  pulmonary artery systolic pressure.  3. Left atrial size was severely dilated.  4. Right atrial size was severely dilated.  5. The mitral valve is grossly normal. Mild mitral valve regurgitation.  No evidence of mitral stenosis.  6. Tricuspid valve regurgitation is severe.  7. The aortic valve is tricuspid. Aortic valve regurgitation is moderate.  No aortic stenosis is present.  8. The inferior vena cava is dilated in size with <50% respiratory  variability, suggesting right atrial pressure of 15 mmHg.  - Comparison(s): Changes from prior study are noted. TR appears severe.    [place] 06/23/20 (in setting of CHF, worsening CKD, anemia): Conclusion: 1. Elevated filling pressures, RV failure out of proportion to left-sided failure.  2. PAPI 1.7 suggesting RV dysfunction.  3. Preserved cardiac output.  4. Moderate pulmonary venous hypertension.  - Will admit patient for close monitoring of creatinine with IV diuresis.    NM PYP Cardiac Amyloidosis Scan 06/03/20: IMPRESSION: Visual and quantitative assessment (grade 2, H/CLL equal 1.1) are NOT suggestive of transthyretin amyloidosis.   RHC/LHC 12/11/19: Conclusion: 1. Normal left and right  heart filling pressures.  2. Mild pulmonary hypertension.  3. Preserved cardiac output.  4. Mild nonobstructive coronary disease ][40% proximal LAD prior to D1 takeoff; 50% ostial D1].  5. CKD, used 20 cc contrast.   Past Medical History:  Diagnosis Date  . A-fib ([place])    on Eliquis  . AICD (automatic cardioverter/defibrillator) present 10/06/2015  . Anemia   . Angiodysplasia of small intestine ([place]) 06/10/2020   Ileum - seen on capsule endoscopy 05/2020 - ablated at colonoscopy  . ARF (acute renal failure) ([place])   . Arthritis    "qwhere" (01/03/2018)  . Asthma    reports mild asthma since childhood - had COPD on dx list from prior PCP  . Chronic bronchitis ([place])    "get it most years; not this past year though" (01/03/2018)  . Chronic kidney disease    "? stage;  followed by [facility]; [name]" (01/03/2018)  . Chronic systolic congestive heart failure ([place])   . COVID-19 05/03/2020  . DEPRESSION 12/17/2009   Annotation: PHQ-9 score = 14 done on 12/17/2009 Qualifier: Diagnosis of  By: Hassell Done FNP, Tori Milks    . Diabetes mellitus without complication (HCC)    DIET CONTROLLED   . Diverticulosis   . Enteritis   . FIBROIDS, UTERUS 03/05/2008  . GI bleeding 01/03/2018  . Glaucoma   . Gout   . Hepatitis C    HEPATITIS C - s/p treatment with Harvoni, saw hepatology, Dawn Drazek  . History of blood transfusion ~ 11/2017  . Hyperlipemia 12/06/2012  . HYPERTENSION, BENIGN 04/24/2007  . Hypothyroidism   . LBBB (left bundle branch block)   . Lower GI bleeding    "  been dealing w/it since 07/2017" (01/03/2018)  . Nonischemic cardiomyopathy ([place])   . OBESITY 05/27/2009  . OBSTRUCTIVE SLEEP APNEA 11/14/2007   no CPAP  . OSTEOPENIA 09/30/2008  . Pain and swelling of left upper extremity 08/08/2017  . Personal history of other infectious and parasitic disease    Hepatitis B  . Pneumonia    "several times" (01/03/2018)  . Pulmonary edema, acute ([place]) 08/09/2017    Past Surgical History:   Procedure Laterality Date  . APPENDECTOMY    . AV FISTULA PLACEMENT Left 08/05/2020   Procedure: LEFT UPPER EXTREMITY ARTERIOVENOUS (AV) FISTULA CREATION;  Surgeon: Cherre Robins, MD;  Location: [place];  Service: Vascular;  Laterality: Left;  . CARDIAC CATHETERIZATION    . CARDIOVERSION N/A 12/14/2014   Procedure: CARDIOVERSION;  Surgeon: Dorothy Spark, MD;  Location: [facility] ENDOSCOPY;  Service: Cardiovascular;  Laterality: N/A;  . CARDIOVERSION N/A 02/26/2017   Procedure: CARDIOVERSION;  Surgeon: Evans Lance, MD;  Location: [place] CV LAB;  Service: Cardiovascular;  Laterality: N/A;  . CARDIOVERSION N/A 07/24/2017   Procedure: CARDIOVERSION;  Surgeon: Thayer Headings, MD;  Location: [facility] ENDOSCOPY;  Service: Cardiovascular;  Laterality: N/A;  . CARDIOVERSION N/A 02/12/2020   Procedure: CARDIOVERSION;  Surgeon: [name], MD;  Location: [facility] ENDOSCOPY;  Service: Cardiovascular;  Laterality: N/A;  . COLONOSCOPY N/A 11/08/2017   Procedure: COLONOSCOPY;  Surgeon: Milus Banister, MD;  Location: [facility] ENDOSCOPY;  Service: Endoscopy;  Laterality: N/A;  . COLONOSCOPY WITH PROPOFOL N/A 05/31/2020   Procedure: COLONOSCOPY WITH PROPOFOL;  Surgeon: Irene Shipper, MD;  Location: [facility] END[facility]COPY;  Service: Endoscopy;  Laterality: N/A;  . DILATION AND CURETTAGE OF UTERUS    . ENTEROSCOPY N/A 03/02/2020   Procedure: ENTEROSCOPY;  Surgeon: Yetta Flock, MD;  Location: [facility] ENDOSCOPY;  Service: Gastroenterology;  Laterality: N/A;  . EP IMPLANTABLE DEVICE N/A 10/06/2015   Procedure: BIV ICD Generator Changeout;  Surgeon: [name], MD;  Location: [place] CV LAB;  Service: Cardiovascular;  Laterality: N/A;  . ESOPHAGOGASTRODUODENOSCOPY N/A 10/26/2017   Procedure: ESOPHAGOGASTRODUODENOSCOPY (EGD);  Surgeon: Ladene Artist, MD;  Location: [facility] END[facility];  Service: Endoscopy;  Laterality: N/A;  . ESOPHAGOGASTRODUODENOSCOPY (EGD) WITH PROPOFOL N/A 11/07/2017   Procedure: ESOPHAGOGASTRODUODENOSCOPY  (EGD) WITH PROPOFOL;  Surgeon: Milus Banister, MD;  Location: [facility] ENDOSCOPY;  Service: Endoscopy;  Laterality: N/A;  . ESOPHAGOGASTRODUODENOSCOPY (EGD) WITH PROPOFOL N/A 05/29/2020   Procedure: ESOPHAGOGASTRODUODENOSCOPY (EGD) WITH PROPOFOL;  Surgeon: Doran Stabler, MD;  Location: [place];  Service: Gastroenterology;  Laterality: N/A;  . GIVENS CAPSULE STUDY N/A 05/19/2020   Procedure: GIVENS CAPSULE STUDY;  Surgeon: [name], MD;  Location: [place];  Service: Endoscopy;  Laterality: N/A;  .adm for obs since pacemaker, PA wil enter order and see pt  . GIVENS CAPSULE STUDY N/A 05/29/2020   Procedure: GIVENS CAPSULE STUDY;  Surgeon: Doran Stabler, MD;  Location: [place];  Service: Gastroenterology;  Laterality: N/A;  . HOT HEMOSTASIS N/A 10/26/2017   Procedure: HOT HEMOSTASIS (ARGON PLASMA COAGULATION/BICAP);  Surgeon: Ladene Artist, MD;  Location: [facility] ENDOS[facility];  Service: Endoscopy;  Laterality: N/A;  . HOT HEMOSTASIS N/A 03/02/2020   Procedure: HOT HEMOSTASIS (ARGON PLASMA COAGULATION/BICAP);  Surgeon: Yetta Flock, MD;  Location: [facility] ENDOSCOPY;  Service: Gastroenterology;  Laterality: N/A;  . HOT HEMOSTASIS N/A 05/31/2020   Procedure: HOT HEMOSTASIS (ARGON PLASMA COAGULATION/BICAP);  Surgeon: Irene Shipper, MD;  Location: [facility] ENDOSCOPY;  Service: Endoscopy;  Laterality: N/A;  .  INSERT / REPLACE / REMOVE PACEMAKER  ?2008  . IR PERC TUN PERIT CATH WO PORT S&I Dartha Lodge  07/05/2020  . POLYPECTOMY  11/08/2017   Procedure: POLYPECTOMY;  Surgeon: Milus Banister, MD;  Location: [facility] ENDOSCOPY;  Service: Endoscopy;;  . PROCTOSCOPY N/A 05/22/2018   Procedure: RIGID PROCTOSCOPY;  Surgeon: Michael Boston, MD;  Location: [facility] ORS;  Service: General;  Laterality: N/A;  . RIGHT HEART CATH N/A 02/13/2018   Procedure: RIGHT HEART CATH;  Surgeon: [name], MD;  Location: [place] CV LAB;  Service: Cardiovascular;  Laterality: N/A;  . RIGHT HEART CATH N/A 06/23/2020    Procedure: RIGHT HEART CATH;  Surgeon: [name], MD;  Location: [place] CV LAB;  Service: Cardiovascular;  Laterality: N/A;  . RIGHT/LEFT HEART CATH AND CORONARY ANGIOGRAPHY N/A 12/11/2019   Procedure: RIGHT/LEFT HEART CATH AND CORONARY ANGIOGRAPHY;  Surgeon: [name], MD;  Location: [place] CV LAB;  Service: Cardiovascular;  Laterality: N/A;  . SUBMUCOSAL TATTOO INJECTION  03/02/2020   Procedure: SUBMUCOSAL TATTOO INJECTION;  Surgeon: Yetta Flock, MD;  Location: [facility] ENDOSCOPY;  Service: Gastroenterology;;  . Lia Foyer TATTOO INJECTION  05/31/2020   Procedure: SUBMUCOSAL TATTOO INJECTION;  Surgeon: Irene Shipper, MD;  Location: [place];  Service: Endoscopy;;  . TUBAL LIGATION    . XI ROBOTIC ASSISTED LOWER ANTERIOR RESECTION N/A 05/22/2018   Procedure: XI ROBOTIC ASSISTED SIGMOID COLOECTOMY MOBILIZATION OF SPLENIC FLEXURE, FIREFLY ASSESSMENT OF PERFUSION;  Surgeon: Michael Boston, MD;  Location: [facility] ORS;  Service: General;  Laterality: N/A;  ERAS PATHWAY    MEDICATIONS: No current facility-administered medications for this encounter.   [name] acetaminophen (TYLENOL) 650 MG CR tablet  . amiodarone (PACERONE) 200 MG tablet  . AURYXIA 1 GM 210 MG(Fe) tablet  . carvedilol (COREG) 3.125 MG tablet  . cromolyn (OPTICROM) 4 % ophthalmic solution  . gabapentin (NEURONTIN) 300 MG capsule  . isosorbide-hydrALAZINE (BIDIL) 20-37.5 MG tablet  . levothyroxine (SYNTHROID) 137 MCG tablet  . nitroGLYCERIN (NITROSTAT) 0.4 MG SL tablet  . oxyCODONE-acetaminophen (PERCOCET) 5-325 MG tablet  . traZODone (DESYREL) 50 MG tablet  . methocarbamol (ROBAXIN) 500 MG tablet    Myra Gianotti, PA-C Surgical Short Stay/Anesthesiology [facility] Phone [phone] [facility] Phone [phone] 09/15/2020 2:48 PM

## 2020-09-15 NOTE — Progress Notes (Signed)
Patient denies shortness of breath, fever, cough or chest pain.  PCP - Dr Grier Mitts Cardiologist - Dr Loralie Champagne Nephrologist - Dr Clover Mealy  Chest x-ray - 06/30/20 (1V) EKG - 09/07/20 Stress Test - n/a ECHO - 06/24/20 Cardiac Cath - 06/23/20  ICD - Medtronic, Last remote check was on 08/03/20.  Faxed Perioperative Prescription for ICD Programing.  Emailed sent to [place]. Buffy in the OR was informed.  Sleep Study -  Yes CPAP - none  Anesthesia review: Yes  STOP now taking any Aspirin (unless otherwise instructed by your surgeon), Aleve, Naproxen, Ibuprofen, Motrin, Advil, Goody's, BC's, all herbal medications, fish oil, and all vitamins.   Coronavirus Screening Covid test on 09/14/20 was negative.  Patient verbalized understanding of instructions that were given via phone.

## 2020-09-15 NOTE — Progress Notes (Signed)
PERIOPERATIVE PRESCRIPTION FOR IMPLANTED CARDIAC DEVICE PROGRAMMING  Patient Information: Name:  Morgan Clay  DOB:  [birth year]  MRN:  [id number]    Planned Procedure: Left Second Stage Basilic Vein Transposition  Surgeon: Dr Jamelle Haring  Date of Procedure: 09/16/20  Cautery will be used.  Position during surgery: supine   Please send documentation back to:  [facility] (Fax # [phone])   Marveen Reeks, RN  09/15/2020 3:39 PM   Device Information:  Clinic EP Physician:  Virl Axe, MD   Device Type:  Defibrillator Manufacturer and Phone #:  Medtronic: [phone] Pacemaker Dependent?:  Yes.   Date of Last Device Check:  08/03/20 Normal Device Function?:  Yes.    Electrophysiologist's Recommendations:   Have magnet available.  Provide continuous ECG monitoring when magnet is used or reprogramming is to be performed.   Procedure will likely interfere with device function.  Device should be programmed:  Tachy therapies disabled and Asynchronous pacing during procedure and returned to normal programming after procedure  Per Device Clinic Standing Orders, York Ram, RN  4:12 PM 09/15/2020

## 2020-09-15 NOTE — Anesthesia Preprocedure Evaluation (Addendum)
Anesthesia Evaluation  Patient identified by MRN, date of birth, ID band Patient awake    Reviewed: Allergy & Precautions, NPO status , Patient's Chart, lab work & pertinent test results  History of Anesthesia Complications Negative for: history of anesthetic complications  Airway Mallampati: III  TM Distance: >3 FB Neck ROM: Full    Dental  (+) Dental Advisory Given   Pulmonary asthma , sleep apnea , COPD,    breath sounds clear to auscultation       Cardiovascular hypertension, Pt. on medications and Pt. on home beta blockers +CHF  + dysrhythmias + Cardiac Defibrillator  Rhythm:Regular  Notes Recorded by Liliane Shi, PA-C on 04/23/2014 at 5:35 PM No ischemia But, EF lower again on this study. Arrange FU Echo prior to her visit with Dr. Virl Axe next week. Richardson Dopp, PA-C  04/23/2014 5:35 PM  1. Left ventricular ejection fraction, by estimation, is 55 to 60%. The  left ventricle has normal function. The left ventricle has no regional  wall motion abnormalities. There is moderate concentric left ventricular  hypertrophy. Left ventricular  diastolic parameters are consistent with Grade II diastolic dysfunction  (pseudonormalization). Elevated left ventricular end-diastolic pressure.  There is the interventricular septum is flattened in systole and diastole,  consistent with right ventricular  pressure and volume overload.  2. Right ventricular systolic function is low normal. The right  ventricular size is moderately enlarged. There is mildly elevated  pulmonary artery systolic pressure.  3. Left atrial size was severely dilated.  4. Right atrial size was severely dilated.  5. The mitral valve is grossly normal. Mild mitral valve regurgitation.  No evidence of mitral stenosis.  6. Tricuspid valve regurgitation is severe.  7. The aortic valve is tricuspid. Aortic valve regurgitation is moderate.  No  aortic stenosis is present.  8. The inferior vena cava is dilated in size with <50% respiratory  variability, suggesting right atrial pressure of 15 mmHg.    Neuro/Psych PSYCHIATRIC DISORDERS Depression    GI/Hepatic negative GI ROS, (+) Hepatitis -, C  Endo/Other  diabetesHypothyroidism   Renal/GU ESRF and DialysisRenal disease     Musculoskeletal  (+) Arthritis ,   Abdominal   Peds  Hematology  (+) Blood dyscrasia, anemia , JEHOVAH'S WITNESSLab Results      Component                Value               Date                      WBC                      5.7                 09/07/2020                HGB                      12.9                09/16/2020                HCT                      38.0                09/16/2020  MCV                      90.6                09/07/2020                PLT                      200                 09/07/2020              Anesthesia Other Findings   Reproductive/Obstetrics                          Anesthesia Physical Anesthesia Plan  ASA: III  Anesthesia Plan: MAC and Regional   Post-op Pain Management:    Induction:   PONV Risk Score and Plan: 2 and Propofol infusion and Treatment may vary due to age or medical condition  Airway Management Planned: Nasal Cannula  Additional Equipment: None  Intra-op Plan:   Post-operative Plan:   Informed Consent: I have reviewed the patients History and Physical, chart, labs and discussed the procedure including the risks, benefits and alternatives for the proposed anesthesia with the patient or authorized representative who has indicated his/her understanding and acceptance.     Dental advisory given  Plan Discussed with: CRNA and Surgeon  Anesthesia Plan Comments: (PAT note written 09/15/2020 by Myra Gianotti, PA-C. )      Anesthesia Quick Evaluation

## 2020-09-16 ENCOUNTER — Encounter (HOSPITAL_COMMUNITY): Payer: Self-pay | Admitting: Vascular Surgery

## 2020-09-16 ENCOUNTER — Other Ambulatory Visit: Payer: Self-pay

## 2020-09-16 ENCOUNTER — Ambulatory Visit (HOSPITAL_COMMUNITY): Payer: Medicare Other | Admitting: Vascular Surgery

## 2020-09-16 ENCOUNTER — Encounter (HOSPITAL_COMMUNITY)
Admission: RE | Disposition: A | Discharge status: Home / Self Care | Payer: Self-pay | Source: Home / Self Care | Attending: Vascular Surgery

## 2020-09-16 ENCOUNTER — Ambulatory Visit (HOSPITAL_COMMUNITY)
Admission: RE | Admit: 2020-09-16 | Discharge: 2020-09-16 | Disposition: A | Discharge status: Home / Self Care | Payer: Medicare Other | Attending: Vascular Surgery | Admitting: Vascular Surgery

## 2020-09-16 DIAGNOSIS — Z992 Dependence on renal dialysis: Secondary | ICD-10-CM | POA: Insufficient documentation

## 2020-09-16 DIAGNOSIS — I083 Combined rheumatic disorders of mitral, aortic and tricuspid valves: Secondary | ICD-10-CM | POA: Insufficient documentation

## 2020-09-16 DIAGNOSIS — Z8616 Personal history of COVID-19: Secondary | ICD-10-CM | POA: Diagnosis not present

## 2020-09-16 DIAGNOSIS — I272 Pulmonary hypertension, unspecified: Secondary | ICD-10-CM | POA: Insufficient documentation

## 2020-09-16 DIAGNOSIS — I700 Atherosclerosis of aorta: Secondary | ICD-10-CM | POA: Diagnosis not present

## 2020-09-16 DIAGNOSIS — Z79890 Hormone replacement therapy (postmenopausal): Secondary | ICD-10-CM | POA: Diagnosis not present

## 2020-09-16 DIAGNOSIS — E1122 Type 2 diabetes mellitus with diabetic chronic kidney disease: Secondary | ICD-10-CM | POA: Diagnosis present

## 2020-09-16 DIAGNOSIS — N186 End stage renal disease: Secondary | ICD-10-CM | POA: Diagnosis not present

## 2020-09-16 DIAGNOSIS — I132 Hypertensive heart and chronic kidney disease with heart failure and with stage 5 chronic kidney disease, or end stage renal disease: Secondary | ICD-10-CM | POA: Insufficient documentation

## 2020-09-16 DIAGNOSIS — I5022 Chronic systolic (congestive) heart failure: Secondary | ICD-10-CM | POA: Insufficient documentation

## 2020-09-16 DIAGNOSIS — N185 Chronic kidney disease, stage 5: Secondary | ICD-10-CM

## 2020-09-16 DIAGNOSIS — Z79899 Other long term (current) drug therapy: Secondary | ICD-10-CM | POA: Diagnosis not present

## 2020-09-16 DIAGNOSIS — N184 Chronic kidney disease, stage 4 (severe): Secondary | ICD-10-CM

## 2020-09-16 HISTORY — PX: BASCILIC VEIN TRANSPOSITION: SHX5742

## 2020-09-16 LAB — GLUCOSE, CAPILLARY
Glucose-Capillary: 102 mg/dL — ABNORMAL HIGH (ref 70–99)
Glucose-Capillary: 106 mg/dL — ABNORMAL HIGH (ref 70–99)
Glucose-Capillary: 99 mg/dL (ref 70–99)

## 2020-09-16 LAB — POCT I-STAT, CHEM 8
BUN: 18 mg/dL (ref 8–23)
Calcium, Ion: 1.12 mmol/L — ABNORMAL LOW (ref 1.15–1.40)
Chloride: 91 mmol/L — ABNORMAL LOW (ref 98–111)
Creatinine, Ser: 4.1 mg/dL — ABNORMAL HIGH (ref 0.44–1.00)
Glucose, Bld: 92 mg/dL (ref 70–99)
HCT: 38 % (ref 36.0–46.0)
Hemoglobin: 12.9 g/dL (ref 12.0–15.0)
Potassium: 3.9 mmol/L (ref 3.5–5.1)
Sodium: 134 mmol/L — ABNORMAL LOW (ref 135–145)
TCO2: 32 mmol/L (ref 22–32)

## 2020-09-16 SURGERY — TRANSPOSITION, VEIN, BASILIC
Anesthesia: Monitor Anesthesia Care | Site: Arm Upper | Laterality: Left

## 2020-09-16 MED ORDER — SODIUM CHLORIDE 0.9 % IV SOLN: INTRAVENOUS | Status: DC

## 2020-09-16 MED ORDER — FENTANYL CITRATE (PF) 100 MCG/2ML IJ SOLN
25.0000 ug | INTRAMUSCULAR | Status: DC | PRN
  Administered 2020-09-16 (×4): 25 ug via INTRAVENOUS

## 2020-09-16 MED ORDER — PROPOFOL 500 MG/50ML IV EMUL
INTRAVENOUS | Status: DC | PRN
  Administered 2020-09-16: 50 ug/kg/min via INTRAVENOUS

## 2020-09-16 MED ORDER — 0.9 % SODIUM CHLORIDE (POUR BTL) OPTIME
TOPICAL | Status: DC | PRN
  Administered 2020-09-16: 1000 mL

## 2020-09-16 MED ORDER — MIDAZOLAM HCL 5 MG/5ML IJ SOLN
INTRAMUSCULAR | Status: DC | PRN
  Administered 2020-09-16 (×2): 1 mg via INTRAVENOUS

## 2020-09-16 MED ORDER — CEFAZOLIN SODIUM-DEXTROSE 2-4 GM/100ML-% IV SOLN
2.0000 g | INTRAVENOUS | Status: AC
  Administered 2020-09-16: 2 g via INTRAVENOUS
  Filled 2020-09-16: qty 100

## 2020-09-16 MED ORDER — CHLORHEXIDINE GLUCONATE 0.12 % MT SOLN
OROMUCOSAL | Status: AC
  Administered 2020-09-16: 15 mL
  Filled 2020-09-16: qty 15

## 2020-09-16 MED ORDER — FENTANYL CITRATE (PF) 100 MCG/2ML IJ SOLN
INTRAMUSCULAR | Status: DC | PRN
  Administered 2020-09-16: 50 ug via INTRAVENOUS
  Administered 2020-09-16 (×2): 25 ug via INTRAVENOUS

## 2020-09-16 MED ORDER — PHENYLEPHRINE 40 MCG/ML (10ML) SYRINGE FOR IV PUSH (FOR BLOOD PRESSURE SUPPORT)
PREFILLED_SYRINGE | INTRAVENOUS | Status: DC | PRN
  Administered 2020-09-16 (×6): 80 ug via INTRAVENOUS

## 2020-09-16 MED ORDER — SODIUM CHLORIDE 0.9 % IV SOLN
INTRAVENOUS | Status: AC
  Filled 2020-09-16: qty 1.2

## 2020-09-16 MED ORDER — FENTANYL CITRATE (PF) 100 MCG/2ML IJ SOLN
INTRAMUSCULAR | Status: AC
  Filled 2020-09-16: qty 2

## 2020-09-16 MED ORDER — MEPIVACAINE HCL (PF) 1.5 % IJ SOLN
INTRAMUSCULAR | Status: DC | PRN
  Administered 2020-09-16: 10 mL via PERINEURAL

## 2020-09-16 MED ORDER — ALBUMIN HUMAN 5 % IV SOLN: INTRAVENOUS | Status: DC | PRN

## 2020-09-16 MED ORDER — ACETAMINOPHEN 160 MG/5ML PO SOLN: 1000.0000 mg | Freq: Once | ORAL | Status: DC | PRN

## 2020-09-16 MED ORDER — HEPARIN SODIUM (PORCINE) 1000 UNIT/ML IJ SOLN
INTRAMUSCULAR | Status: DC | PRN
  Administered 2020-09-16: 5000 [IU] via INTRAVENOUS

## 2020-09-16 MED ORDER — ACETAMINOPHEN 500 MG PO TABS: 1000.0000 mg | ORAL_TABLET | Freq: Once | ORAL | Status: DC | PRN

## 2020-09-16 MED ORDER — OXYCODONE HCL 5 MG PO TABS
ORAL_TABLET | ORAL | Status: AC
  Filled 2020-09-16: qty 1

## 2020-09-16 MED ORDER — ACETAMINOPHEN 10 MG/ML IV SOLN: 1000.0000 mg | Freq: Once | INTRAVENOUS | Status: DC | PRN

## 2020-09-16 MED ORDER — OXYCODONE HCL 5 MG/5ML PO SOLN: 5.0000 mg | Freq: Once | ORAL | Status: AC | PRN

## 2020-09-16 MED ORDER — CHLORHEXIDINE GLUCONATE 4 % EX LIQD: 60.0000 mL | Freq: Once | CUTANEOUS | Status: DC

## 2020-09-16 MED ORDER — OXYCODONE HCL 5 MG PO TABS
5.0000 mg | ORAL_TABLET | Freq: Once | ORAL | Status: AC | PRN
Start: 2020-09-16 — End: 2020-09-16
  Administered 2020-09-16: 5 mg via ORAL

## 2020-09-16 MED ORDER — MIDAZOLAM HCL 2 MG/2ML IJ SOLN
INTRAMUSCULAR | Status: AC
  Filled 2020-09-16: qty 2

## 2020-09-16 MED ORDER — OXYCODONE-ACETAMINOPHEN 10-325 MG PO TABS: 1.0000 | ORAL_TABLET | Freq: Four times a day (QID) | ORAL | 0 refills | Status: DC | PRN

## 2020-09-16 MED ORDER — ONDANSETRON HCL 4 MG/2ML IJ SOLN
INTRAMUSCULAR | Status: DC | PRN
  Administered 2020-09-16: 4 mg via INTRAVENOUS

## 2020-09-16 MED ORDER — SODIUM CHLORIDE 0.9 % IV SOLN
INTRAVENOUS | Status: DC | PRN
  Administered 2020-09-16: 500 mL

## 2020-09-16 MED ORDER — LIDOCAINE-EPINEPHRINE (PF) 1.5 %-1:200000 IJ SOLN
INTRAMUSCULAR | Status: DC | PRN
  Administered 2020-09-16: 20 mL via PERINEURAL

## 2020-09-16 MED ORDER — LIDOCAINE-EPINEPHRINE 1 %-1:100000 IJ SOLN
INTRAMUSCULAR | Status: AC
  Filled 2020-09-16: qty 1

## 2020-09-16 MED ORDER — LIDOCAINE 2% (20 MG/ML) 5 ML SYRINGE
INTRAMUSCULAR | Status: DC | PRN
  Administered 2020-09-16: 20 mg via INTRAVENOUS

## 2020-09-16 SURGICAL SUPPLY — 31 items
ARMBAND PINK RESTRICT EXTREMIT (MISCELLANEOUS) ×2 IMPLANT
BNDG ELASTIC 4X5.8 VLCR STR LF (GAUZE/BANDAGES/DRESSINGS) ×2 IMPLANT
BNDG ELASTIC 6X5.8 VLCR STR LF (GAUZE/BANDAGES/DRESSINGS) ×2 IMPLANT
CANISTER SUCT 3000ML PPV (MISCELLANEOUS) ×2 IMPLANT
CLIP VESOCCLUDE MED 24/CT (CLIP) IMPLANT
CLIP VESOCCLUDE MED 6/CT (CLIP) IMPLANT
CLIP VESOCCLUDE SM WIDE 24/CT (CLIP) IMPLANT
CLIP VESOCCLUDE SM WIDE 6/CT (CLIP) IMPLANT
COVER PROBE W GEL 5X96 (DRAPES) ×2 IMPLANT
DERMABOND ADVANCED (GAUZE/BANDAGES/DRESSINGS) ×1
DERMABOND ADVANCED .7 DNX12 (GAUZE/BANDAGES/DRESSINGS) ×1 IMPLANT
ELECT REM PT RETURN 9FT ADLT (ELECTROSURGICAL) ×2
ELECTRODE REM PT RTRN 9FT ADLT (ELECTROSURGICAL) ×1 IMPLANT
GLOVE BIO SURGEON STRL SZ7.5 (GLOVE) ×2 IMPLANT
GOWN STRL REUS W/ TWL LRG LVL3 (GOWN DISPOSABLE) ×2 IMPLANT
GOWN STRL REUS W/ TWL XL LVL3 (GOWN DISPOSABLE) ×1 IMPLANT
GOWN STRL REUS W/TWL LRG LVL3 (GOWN DISPOSABLE) ×4
GOWN STRL REUS W/TWL XL LVL3 (GOWN DISPOSABLE) ×2
KIT BASIN OR (CUSTOM PROCEDURE TRAY) ×2 IMPLANT
KIT TURNOVER KIT B (KITS) ×2 IMPLANT
NS IRRIG 1000ML POUR BTL (IV SOLUTION) ×2 IMPLANT
PACK CV ACCESS (CUSTOM PROCEDURE TRAY) ×2 IMPLANT
PAD ARMBOARD 7.5X6 YLW CONV (MISCELLANEOUS) ×4 IMPLANT
SUT MNCRL AB 4-0 PS2 18 (SUTURE) ×4 IMPLANT
SUT PROLENE 6 0 BV (SUTURE) ×2 IMPLANT
SUT SILK 2 0 SH (SUTURE) ×2 IMPLANT
SUT VIC AB 3-0 SH 27 (SUTURE) ×6
SUT VIC AB 3-0 SH 27X BRD (SUTURE) ×3 IMPLANT
TOWEL GREEN STERILE (TOWEL DISPOSABLE) ×2 IMPLANT
UNDERPAD 30X36 HEAVY ABSORB (UNDERPADS AND DIAPERS) ×2 IMPLANT
WATER STERILE IRR 1000ML POUR (IV SOLUTION) ×2 IMPLANT

## 2020-09-16 NOTE — Telephone Encounter (Signed)
Transmission received 09/07/2020

## 2020-09-16 NOTE — Progress Notes (Signed)
[employer] Rep paged.  Need rep to see patient before surgery.  [name], [employer] rep, called and stated he was aware of surgery, he received the email regarding patient's surgery, and stated he would be there before surgery at 9:50 AM.

## 2020-09-16 NOTE — Interval H&P Note (Signed)
History and Physical Interval Note:  09/16/2020 9:55 AM  Morgan Clay  has presented today for surgery, with the diagnosis of ESRD.  The various methods of treatment have been discussed with the patient and family. After consideration of risks, benefits and other options for treatment, the patient has consented to  Procedure(s) with comments: LEFT SECOND STAGE [place] (Left) - PERIPHERAL NERVE BLOCK as a surgical intervention.  The patient's history has been reviewed, patient examined, no change in status, stable for surgery.  I have reviewed the patient's chart and labs.  Questions were answered to the patient's satisfaction.     Cherre Robins

## 2020-09-16 NOTE — Anesthesia Procedure Notes (Signed)
Anesthesia Regional Block: Supraclavicular block   Pre-Anesthetic Checklist: ,, timeout performed, Correct Patient, Correct Site, Correct Laterality, Correct Procedure, Correct Position, site marked, Risks and benefits discussed,  Surgical consent,  Pre-op evaluation,  At surgeon's request and post-op pain management  Laterality: Left and Upper  Prep: chloraprep       Needles:  Injection technique: Single-shot     Needle Length: 5cm  Needle Gauge: 22     Additional Needles: Arrow StimuQuik ECHO Echogenic Stimulating PNB Needle  Procedures:,,,, ultrasound used (permanent image in chart),,,,  Narrative:  Start time: 09/16/2020 10:01 AM End time: 09/16/2020 10:09 AM Injection made incrementally with aspirations every 5 mL.  Performed by: Personally  Anesthesiologist: Oleta Mouse, MD

## 2020-09-16 NOTE — Progress Notes (Signed)
Orthopedic Tech Progress Note Patient Details:  Morgan Clay July 20, 1945 [id number]  Ortho Devices Type of Ortho Device: Arm sling Ortho Device/Splint Interventions: Ordered       [name] [name] 09/16/2020, 1:55 PM

## 2020-09-16 NOTE — Op Note (Signed)
DATE OF SERVICE: 09/16/2020  PATIENT:  Morgan Clay  75 y.o. female  PRE-OPERATIVE DIAGNOSIS:  ESRD  POST-OPERATIVE DIAGNOSIS:  Same  PROCEDURE:   Left second stage basilic vein transposition  SURGEON:  Surgeon(s) and Role:    * Cherre Robins, MD - Primary  ASSISTANT: Risa Grill, PA-C  An assistant was required to facilitate exposure and expedite the case.  ANESTHESIA:   regional and MAC  EBL: min  BLOOD ADMINISTERED:none  DRAINS: none   LOCAL MEDICATIONS USED:  NONE   SPECIMEN:  none  COUNTS: confirmed correct.  TOURNIQUET:  None  PATIENT DISPOSITION:  PACU - hemodynamically stable.   Delay start of Pharmacological VTE agent (>24hrs) due to surgical blood loss or risk of bleeding: no  INDICATION FOR PROCEDURE: [name] is a 75 y.o. female with ESRD in need of permanent HD access. I created a left first stage basilic vein transposition for her 08/05/20. After careful discussion of risks, benefits, and alternatives the patient was offered second stage basilic vein transposition. We specifically discussed risk of steal syndrome. The patient understood and wished to proceed.  OPERATIVE FINDINGS: healthy basilic vein fistula transposed to more anterior position.  DESCRIPTION OF PROCEDURE: After identification of the patient in the pre-operative holding area, the patient was transferred to the operating room. The patient was positioned supine on the operating room table. Anesthesia was induced. The left arm was prepped and draped in standard fashion. A surgical pause was performed confirming correct patient, procedure, and operative location.  Using intraoperative ultrasound the course of the left basilic vein was marked on the skin.  3 skip incisions were made over the course of the basilic vein fistula.  These were carried down through subcutaneous tissue until the fistula was encountered.  The fascia was skeletonized from the axilla to the anastomosis,  taking care to ligate and divide sidebranches, and to protect the medial antebrachial cutaneous nerve.   A subcutaneous tunnel was created over the biceps using a sheath tunneling device.  Patient was heparinized.  The fistula near the anastomosis was clamped.  The outflow in the axilla was clamped.  The fistula was divided.  The fistula was marked to ensure no twisting or kinking while delivering the fistula through the tunnel.  The fistula was tunneled through the arm and delivered near the previous anastomosis.  The fistula was spatulated proximally and distally.  The fistula was reanastomosed end-to-end using continuous running suture of 5-0 Prolene.  A palpable thrill was felt over the subcutaneous course of the tunnel.  Doppler flow was excellent in the proximal and distal fistula.  The wounds were copiously irrigated.  Hemostasis was ensured in the surgical bed.  The wounds were closed in layers using 3-0 Vicryl and 4-0 Monocryl.  Upon completion of the case instrument and sharps counts were confirmed correct. The patient was transferred to the PACU in good condition. I was present for all portions of the procedure.  [name]. Stanford Breed, MD [facility] Phone Number: [phone] 09/16/2020 12:27 PM

## 2020-09-16 NOTE — Telephone Encounter (Signed)
09/07/20 patient was back in rhythm. No further alerts have been received.

## 2020-09-16 NOTE — Progress Notes (Signed)
[employer] rep called again.  Patient has not been seen by [employer] and CRNA is ready to go back for surgery.  [name] was called again.  Legrand Como stated he forgot about the patient and that it would take about 15 minutes to get there.  [facility] notified.

## 2020-09-16 NOTE — Discharge Instructions (Signed)
° °  [facility] ° °Discharge Instructions ° °AV Fistula or Graft Surgery for Dialysis Access ° °Please refer to the following instructions for your post-procedure care. Your surgeon or physician assistant will discuss any changes with you. ° °Activity ° °You may drive the day following your surgery, if you are comfortable and no longer taking prescription pain medication. Resume full activity as the soreness in your incision resolves. ° °Bathing/Showering ° °You may shower after you go home. Keep your incision dry for 48 hours. Do not soak in a bathtub, hot tub, or swim until the incision heals completely. You may not shower if you have a hemodialysis catheter. ° °Incision Care ° °Clean your incision with mild soap and water after 48 hours. Pat the area dry with a clean towel. You do not need a bandage unless otherwise instructed. Do not apply any ointments or creams to your incision. You may have skin glue on your incision. Do not peel it off. It will come off on its own in about one week. Your arm may swell a bit after surgery. To reduce swelling use pillows to elevate your arm so it is above your heart. Your doctor will tell you if you need to lightly wrap your arm with an ACE bandage. ° °Diet ° °Resume your normal diet. There are not special food restrictions following this procedure. In order to heal from your surgery, it is CRITICAL to get adequate nutrition. Your body requires vitamins, minerals, and protein. Vegetables are the best source of vitamins and minerals. Vegetables also provide the perfect balance of protein. Processed food has little nutritional value, so try to avoid this. ° °Medications ° °Resume taking all of your medications. If your incision is causing pain, you may take over-the counter pain relievers such as acetaminophen (Tylenol). If you were prescribed a stronger pain medication, please be aware these medications can cause nausea and constipation. Prevent  nausea by taking the medication with a snack or meal. Avoid constipation by drinking plenty of fluids and eating foods with high amount of fiber, such as fruits, vegetables, and grains. Do not take Tylenol if you are taking prescription pain medications. ° ° ° ° °Follow up °Your surgeon may want to see you in the office following your access surgery. If so, this will be arranged at the time of your surgery. ° °Please call us immediately for any of the following conditions: ° °Increased pain, redness, drainage (pus) from your incision site °Fever of 101 degrees or higher °Severe or worsening pain at your incision site °Hand pain or numbness. ° °Reduce your risk of vascular disease: ° °Stop smoking. If you would like help, call [facility] at 1-800-QUIT-NOW (1-800-784-8669) or [place] at 336-586-4000 ° °Manage your cholesterol °Maintain a desired weight °Control your diabetes °Keep your blood pressure down ° °Dialysis ° °It will take several weeks to several months for your new dialysis access to be ready for use. Your surgeon will determine when it is OK to use it. Your nephrologist will continue to direct your dialysis. You can continue to use your Permcath until your new access is ready for use. ° °If you have any questions, please call the office at [phone]. ° °

## 2020-09-16 NOTE — Transfer of Care (Signed)
Immediate Anesthesia Transfer of Care Note  Patient: Morgan Clay  Procedure(s) Performed: LEFT SECOND STAGE BASCILIC VEIN TRANSPOSITION (Left Arm Upper)  Patient Location: PACU  Anesthesia Type:MAC and Regional  Level of Consciousness: awake, alert , oriented and patient cooperative  Airway & Oxygen Therapy: Patient Spontanous Breathing and Patient connected to nasal cannula oxygen  Post-op Assessment: Report given to RN and Post -op Vital signs reviewed and stable  Post vital signs: Reviewed and stable  Last Vitals:  Vitals Value Taken Time  BP 147/127 09/16/20 1239  Temp    Pulse 80 09/16/20 1240  Resp 10 09/16/20 1240  SpO2 100 % 09/16/20 1240  Vitals shown include unvalidated device data.  Last Pain:  Vitals:   09/16/20 0732  PainSc: 0-No pain         Complications: No complications documented.

## 2020-09-17 ENCOUNTER — Encounter (HOSPITAL_COMMUNITY): Payer: Self-pay | Admitting: Vascular Surgery

## 2020-09-18 ENCOUNTER — Other Ambulatory Visit: Payer: Self-pay

## 2020-09-18 DIAGNOSIS — N186 End stage renal disease: Secondary | ICD-10-CM

## 2020-09-20 ENCOUNTER — Encounter (HOSPITAL_COMMUNITY): Payer: Self-pay | Admitting: Vascular Surgery

## 2020-09-20 NOTE — Anesthesia Postprocedure Evaluation (Signed)
Anesthesia Post Note  Patient: Morgan Clay  Procedure(s) Performed: LEFT SECOND STAGE BASCILIC VEIN TRANSPOSITION (Left Arm Upper)     Patient location during evaluation: PACU Anesthesia Type: Regional and MAC Level of consciousness: awake and alert Pain management: pain level controlled Vital Signs Assessment: post-procedure vital signs reviewed and stable Respiratory status: spontaneous breathing, nonlabored ventilation, respiratory function stable and patient connected to nasal cannula oxygen Cardiovascular status: stable and blood pressure returned to baseline Postop Assessment: no apparent nausea or vomiting Anesthetic complications: no   No complications documented.  Last Vitals:  Vitals:   09/16/20 1410 09/16/20 1425  BP: (!) 120/51 (!) 102/45  Pulse: 60   Resp: 11 15  Temp:  36.4 C  SpO2: 100% 100%    Last Pain:  Vitals:   09/16/20 1310  PainSc: 9                  Pravin Perezperez

## 2020-09-23 ENCOUNTER — Inpatient Hospital Stay (HOSPITAL_COMMUNITY)
Admission: EM | Admit: 2020-09-23 | Discharge: 2020-09-26 | DRG: 252 | Disposition: A | Discharge status: Home / Self Care | Payer: Medicare Other | Attending: Internal Medicine | Admitting: Internal Medicine

## 2020-09-23 DIAGNOSIS — Z20822 Contact with and (suspected) exposure to covid-19: Secondary | ICD-10-CM | POA: Diagnosis present

## 2020-09-23 DIAGNOSIS — E1122 Type 2 diabetes mellitus with diabetic chronic kidney disease: Secondary | ICD-10-CM | POA: Diagnosis present

## 2020-09-23 DIAGNOSIS — Z8249 Family history of ischemic heart disease and other diseases of the circulatory system: Secondary | ICD-10-CM

## 2020-09-23 DIAGNOSIS — I428 Other cardiomyopathies: Secondary | ICD-10-CM

## 2020-09-23 DIAGNOSIS — Z801 Family history of malignant neoplasm of trachea, bronchus and lung: Secondary | ICD-10-CM

## 2020-09-23 DIAGNOSIS — I4891 Unspecified atrial fibrillation: Secondary | ICD-10-CM | POA: Diagnosis present

## 2020-09-23 DIAGNOSIS — Z823 Family history of stroke: Secondary | ICD-10-CM

## 2020-09-23 DIAGNOSIS — N186 End stage renal disease: Secondary | ICD-10-CM | POA: Diagnosis present

## 2020-09-23 DIAGNOSIS — E039 Hypothyroidism, unspecified: Secondary | ICD-10-CM | POA: Diagnosis present

## 2020-09-23 DIAGNOSIS — T82838A Hemorrhage of vascular prosthetic devices, implants and grafts, initial encounter: Principal | ICD-10-CM | POA: Diagnosis present

## 2020-09-23 DIAGNOSIS — E1129 Type 2 diabetes mellitus with other diabetic kidney complication: Secondary | ICD-10-CM | POA: Diagnosis present

## 2020-09-23 DIAGNOSIS — D631 Anemia in chronic kidney disease: Secondary | ICD-10-CM | POA: Diagnosis present

## 2020-09-23 DIAGNOSIS — Z6826 Body mass index (BMI) 26.0-26.9, adult: Secondary | ICD-10-CM

## 2020-09-23 DIAGNOSIS — Y832 Surgical operation with anastomosis, bypass or graft as the cause of abnormal reaction of the patient, or of later complication, without mention of misadventure at the time of the procedure: Secondary | ICD-10-CM | POA: Diagnosis present

## 2020-09-23 DIAGNOSIS — T884XXA Failed or difficult intubation, initial encounter: Secondary | ICD-10-CM

## 2020-09-23 DIAGNOSIS — Z825 Family history of asthma and other chronic lower respiratory diseases: Secondary | ICD-10-CM

## 2020-09-23 DIAGNOSIS — Z8619 Personal history of other infectious and parasitic diseases: Secondary | ICD-10-CM

## 2020-09-23 DIAGNOSIS — N179 Acute kidney failure, unspecified: Secondary | ICD-10-CM | POA: Diagnosis present

## 2020-09-23 DIAGNOSIS — Z79899 Other long term (current) drug therapy: Secondary | ICD-10-CM

## 2020-09-23 DIAGNOSIS — I953 Hypotension of hemodialysis: Secondary | ICD-10-CM | POA: Diagnosis not present

## 2020-09-23 DIAGNOSIS — G8918 Other acute postprocedural pain: Secondary | ICD-10-CM

## 2020-09-23 DIAGNOSIS — Z992 Dependence on renal dialysis: Secondary | ICD-10-CM

## 2020-09-23 DIAGNOSIS — E785 Hyperlipidemia, unspecified: Secondary | ICD-10-CM | POA: Diagnosis present

## 2020-09-23 DIAGNOSIS — T148XXA Other injury of unspecified body region, initial encounter: Secondary | ICD-10-CM | POA: Diagnosis not present

## 2020-09-23 DIAGNOSIS — E669 Obesity, unspecified: Secondary | ICD-10-CM | POA: Diagnosis present

## 2020-09-23 DIAGNOSIS — F32A Depression, unspecified: Secondary | ICD-10-CM | POA: Diagnosis present

## 2020-09-23 DIAGNOSIS — G4733 Obstructive sleep apnea (adult) (pediatric): Secondary | ICD-10-CM | POA: Diagnosis present

## 2020-09-23 DIAGNOSIS — B1920 Unspecified viral hepatitis C without hepatic coma: Secondary | ICD-10-CM | POA: Diagnosis present

## 2020-09-23 DIAGNOSIS — J449 Chronic obstructive pulmonary disease, unspecified: Secondary | ICD-10-CM | POA: Diagnosis present

## 2020-09-23 DIAGNOSIS — N2581 Secondary hyperparathyroidism of renal origin: Secondary | ICD-10-CM | POA: Diagnosis present

## 2020-09-23 DIAGNOSIS — I132 Hypertensive heart and chronic kidney disease with heart failure and with stage 5 chronic kidney disease, or end stage renal disease: Secondary | ICD-10-CM | POA: Diagnosis present

## 2020-09-23 DIAGNOSIS — D696 Thrombocytopenia, unspecified: Secondary | ICD-10-CM | POA: Diagnosis present

## 2020-09-23 DIAGNOSIS — K7460 Unspecified cirrhosis of liver: Secondary | ICD-10-CM | POA: Diagnosis present

## 2020-09-23 DIAGNOSIS — Z8616 Personal history of COVID-19: Secondary | ICD-10-CM

## 2020-09-23 DIAGNOSIS — D62 Acute posthemorrhagic anemia: Secondary | ICD-10-CM | POA: Diagnosis present

## 2020-09-23 DIAGNOSIS — I5042 Chronic combined systolic (congestive) and diastolic (congestive) heart failure: Secondary | ICD-10-CM | POA: Diagnosis present

## 2020-09-23 DIAGNOSIS — I4510 Unspecified right bundle-branch block: Secondary | ICD-10-CM | POA: Diagnosis present

## 2020-09-23 DIAGNOSIS — K7469 Other cirrhosis of liver: Secondary | ICD-10-CM | POA: Diagnosis present

## 2020-09-23 DIAGNOSIS — Z95810 Presence of automatic (implantable) cardiac defibrillator: Secondary | ICD-10-CM

## 2020-09-23 DIAGNOSIS — Z79890 Hormone replacement therapy (postmenopausal): Secondary | ICD-10-CM

## 2020-09-23 DIAGNOSIS — M1990 Unspecified osteoarthritis, unspecified site: Secondary | ICD-10-CM | POA: Diagnosis present

## 2020-09-23 MED ORDER — HYDROMORPHONE HCL 1 MG/ML IJ SOLN
1.0000 mg | Freq: Once | INTRAMUSCULAR | Status: AC
Start: 2020-09-23 — End: 2020-09-23
  Administered 2020-09-23: 1 mg via INTRAVENOUS
  Filled 2020-09-23: qty 1

## 2020-09-23 NOTE — ED Triage Notes (Signed)
Pt arrives to ED BIB [facility] due to Dialysis port problem. Per EMS pt had a Vein repair in the dialysis site last Thursday and it began to bleed tonight. Bleeding is controlled. Pt a/o x4 rates pain 10/10.  138mcg Fentanyl IN was administered by EMS. Pt does Dialysis MWF.

## 2020-09-23 NOTE — ED Provider Notes (Signed)
[facility] EMERGENCY DEPARTMENT Provider Note   CSN: [id number] Arrival date & time: 09/23/20  2259     History Chief Complaint  Patient presents with  . Vascular Access Problem    Morgan Clay is a 75 y.o. female.  The history is provided by the patient.  Illness Location:  Left arm Severity:  Mild Onset quality:  Gradual Timing:  Constant Progression:  Worsening Chronicity:  New Context:  Pain at left upper arm at surgical site.  Patient had second stage basilic vein transportation procedure by vascular surgery last week.  Has had some bleeding but today bleeding was worse and swelling worse and pain worse. Relieved by:  Nothing Worsened by:  Nothing Associated symptoms: no abdominal pain, no chest pain, no congestion, no cough, no diarrhea, no ear pain, no fatigue, no fever, no headaches, no loss of consciousness, no myalgias, no nausea, no rash, no rhinorrhea, no shortness of breath, no sore throat, no vomiting and no wheezing        Past Medical History:  Diagnosis Date  . A-fib ([place])    on Eliquis  . AICD (automatic cardioverter/defibrillator) present 10/06/2015  . Anemia   . Angiodysplasia of small intestine ([place]) 06/10/2020   Ileum - seen on capsule endoscopy 05/2020 - ablated at colonoscopy  . ARF (acute renal failure) ([place])   . Arthritis    "qwhere" (01/03/2018)  . Asthma    reports mild asthma since childhood - had COPD on dx list from prior PCP  . Chronic bronchitis ([place])    "get it most years; not this past year though" (01/03/2018)  . Chronic kidney disease    "? stage;  followed by [facility]; [name]" (01/03/2018)  . Chronic systolic congestive heart failure ([place])   . Cirrhosis ([place]) 09/14/2020   dx by Dr Silvano Rusk   . COVID-19 05/03/2020  . DEPRESSION 12/17/2009   Annotation: PHQ-9 score = 14 done on 12/17/2009 Qualifier: Diagnosis of  By: Hassell Done FNP, Tori Milks    . Diabetes mellitus without complication (HCC)    DIET  CONTROLLED   . Diverticulosis   . Enteritis   . FIBROIDS, UTERUS 03/05/2008  . GI bleeding 01/03/2018  . Glaucoma   . Gout   . Hepatitis C    HEPATITIS C - s/p treatment with Harvoni, saw hepatology, Dawn Drazek  . History of blood transfusion ~ 11/2017  . Hyperlipemia 12/06/2012  . HYPERTENSION, BENIGN 04/24/2007  . Hypothyroidism   . LBBB (left bundle branch block)   . Lower GI bleeding    "been dealing w/it since 07/2017" (01/03/2018)  . Nonischemic cardiomyopathy ([place])   . OBESITY 05/27/2009  . OBSTRUCTIVE SLEEP APNEA 11/14/2007   no CPAP  . OSTEOPENIA 09/30/2008  . Pain and swelling of left upper extremity 08/08/2017  . Personal history of other infectious and parasitic disease    Hepatitis B  . Pneumonia    "several times" (01/03/2018)  . Pulmonary edema, acute ([place]) 08/09/2017    Patient Active Problem List   Diagnosis Date Noted  . Malnutrition of moderate degree 07/09/2020  . Angiodysplasia of small intestine ([place]) 06/10/2020  . Acute on chronic diastolic CHF (congestive heart failure) ([place])   . COVID-19 virus infection 05/27/2020  . Anemia due to chronic blood loss 04/15/2020  . Overweight (BMI 25.0-29.9) 03/02/2020  . Long term current use of anticoagulant   . Persistent atrial fibrillation ([place]) 02/06/2020  . Secondary hypercoagulable state ([place]) 02/06/2020  . CHF (congestive heart  failure) ([place]) 07/23/2019  . 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 (HCV) ([place])   . Gout   . Anemia in chronic kidney disease 01/18/2018  . Pulmonary hypertension ([place]) on echocardiogram 01/14/2018  . AVM (arteriovenous malformation) of small bowel, acquired 01/14/2018  . Atrial fibrillation, chronic 10/25/2017  . Aortic atherosclerosis ([place]) 08/30/2017  . Diverticulitis of left colon status post robotic low anterior to sigmoid resection 05/22/2018 07/31/2017  . Hypertension associated with diabetes ([place]) 06/07/2017  . MDD (major depressive disorder), recurrent, in partial  remission ([place]) 06/07/2017  . Asthma 05/22/2017  . Chronic obstructive pulmonary disease ([place])   . Atypical atrial flutter ([place]) 09/28/2015  . Type 2 diabetes, controlled, with renal manifestation ([place]) 11/24/2013  . CKD (chronic kidney disease) stage 4, GFR 15-29 ml/min  11/24/2013  . Nonischemic cardiomyopathy ([place]) 11/22/2010  . Obesity 05/27/2009  . OSTEOPENIA 09/30/2008  . FIBROIDS, UTERUS 03/05/2008  . OBSTRUCTIVE SLEEP APNEA 11/14/2007  . Chronic diastolic CHF (congestive heart failure) ([place]) 04/24/2007  . Hypothyroidism 01/28/2007  . Biventricular ICD (implantable cardioverter-defibrillator) in place 01/08/2007    Past Surgical History:  Procedure Laterality Date  . APPENDECTOMY    . AV FISTULA PLACEMENT Left 08/05/2020   Procedure: LEFT UPPER EXTREMITY ARTERIOVENOUS (AV) FISTULA CREATION;  Surgeon: Cherre Robins, MD;  Location: [facility];  Service: Vascular;  Laterality: Left;  . BASCILIC VEIN TRANSPOSITION Left 09/16/2020   Procedure: LEFT SECOND STAGE BASCILIC VEIN TRANSPOSITION;  Surgeon: Cherre Robins, MD;  Location: [facility] OR;  Service: Vascular;  Laterality: Left;  PERIPHERAL NERVE BLOCK  . CARDIAC CATHETERIZATION    . CARDIOVERSION N/A 12/14/2014   Procedure: CARDIOVERSION;  Surgeon: Dorothy Spark, MD;  Location[facility] END[facility]COPY;  Service: Cardiovascular;  Laterality: N/A;  . CARDIOVERSION N/A 02/26/2017   Procedure: CARDIOVERSION;  Surgeon: Evans Lance, MD;  Location: [facility] CV LAB;  Service: Cardiovascular;  Laterality: N/A;  . CARDIOVERSION N/A 07/24/2017   Procedure: CARDIOVERSION;  Surgeon: Thayer Headings, MD;  Location: [facility] ENDOSCOPY;  Service: Cardiovascular;  Laterality: N/A;  . CARDIOVERSION N/A 02/12/2020   Procedure: CARDIOVERSION;  Surgeon: [name], MD;  Location: [facility] ENDOSCOPY;  Service: Cardiovascular;  Laterality: N/A;  . COLONOSCOPY N/A 11/08/2017   Procedure: COLONOSCOPY;  Surgeon: Milus Banister, MD;  Location: [facility] ENDOSCOPY;  Service:  Endoscopy;  Laterality: N/A;  . COLONOSCOPY WITH PROPOFOL N/A 05/31/2020   Procedure: COLONOSCOPY WITH PROPOFOL;  Surgeon: Irene Shipper, MD;  Location: [facility] ENDOSCOPY;  Service: Endoscopy;  Laterality: N/A;  . DILATION AND CURETTAGE OF UTERUS    . ENTEROSCOPY N/A 03/02/2020   Procedure: ENTEROSCOPY;  Surgeon: Yetta Flock, MD;  Location: [facility] ENDOSCOPY;  Service: Gastroenterology;  Laterality: N/A;  . EP IMPLANTABLE DEVICE N/A 10/06/2015   Procedure: BIV ICD Generator Changeout;  Surgeon: [name], MD;  Location: [facility] CV LAB;  Service: Cardiovascular;  Laterality: N/A;  . ESOPHAGOGASTRODUODENOSCOPY N/A 10/26/2017   Procedure: ESOPHAGOGASTRODUODENOSCOPY (EGD);  Surgeon: Ladene Artist, MD;  Location: [facility] ENDOS[facility];  Service: Endoscopy;  Laterality: N/A;  . ESOPHAGOGASTRODUODENOSCOPY (EGD) WITH PROPOFOL N/A 11/07/2017   Procedure: ESOPHAGOGASTRODUODENOSCOPY (EGD) WITH PROPOFOL;  Surgeon: Milus Banister, MD;  Location: [facility] ENDOSCOPY;  Service: Endoscopy;  Laterality: N/A;  . ESOPHAGOGASTRODUODENOSCOPY (EGD) WITH PROPOFOL N/A 05/29/2020   Procedure: ESOPHAGOGASTRODUODENOSCOPY (EGD) WITH PROPOFOL;  Surgeon: Doran Stabler, MD;  Location: [facility];  Service: Gastroenterology;  Laterality: N/A;  . GIVENS CAPSULE STUDY N/A 05/19/2020   Procedure: GIVENS CAPSULE STUDY;  Surgeon: [name], MD;  Location: [facility] ENDOSCOPY;  Service: Endoscopy;  Laterality: N/A;  .adm for obs since pacemaker, PA wil enter order and see pt  . GIVENS CAPSULE STUDY N/A 05/29/2020   Procedure: GIVENS CAPSULE STUDY;  Surgeon: Doran Stabler, MD;  Location: [facility];  Service: Gastroenterology;  Laterality: N/A;  . HOT HEMOSTASIS N/A 10/26/2017   Procedure: HOT HEMOSTASIS (ARGON PLASMA COAGULATION/BICAP);  Surgeon: Ladene Artist, MD;  Location: [facility] ENDOS[facility];  Service: Endoscopy;  Laterality: N/A;  . HOT HEMOSTASIS N/A 03/02/2020   Procedure: HOT HEMOSTASIS (ARGON PLASMA COAGULATION/BICAP);   Surgeon: Yetta Flock, MD;  Location: [facility] ENDOSCOPY;  Service: Gastroenterology;  Laterality: N/A;  . HOT HEMOSTASIS N/A 05/31/2020   Procedure: HOT HEMOSTASIS (ARGON PLASMA COAGULATION/BICAP);  Surgeon: Irene Shipper, MD;  Location: [facility] ENDOSCOPY;  Service: Endoscopy;  Laterality: N/A;  . INSERT / REPLACE / REMOVE PACEMAKER  ?2008  . IR PERC TUN PERIT CATH WO PORT S&I Dartha Lodge  07/05/2020  . POLYPECTOMY  11/08/2017   Procedure: POLYPECTOMY;  Surgeon: Milus Banister, MD;  Location: [facility] ENDOSCOPY;  Service: Endoscopy;;  . PROCTOSCOPY N/A 05/22/2018   Procedure: RIGID PROCTOSCOPY;  Surgeon: Michael Boston, MD;  Location: [facility] ORS;  Service: General;  Laterality: N/A;  . RIGHT HEART CATH N/A 02/13/2018   Procedure: RIGHT HEART CATH;  Surgeon: [name], MD;  Location: [facility] CV LAB;  Service: Cardiovascular;  Laterality: N/A;  . RIGHT HEART CATH N/A 06/23/2020   Procedure: RIGHT HEART CATH;  Surgeon: [name], MD;  Location: [facility] CV LAB;  Service: Cardiovascular;  Laterality: N/A;  . RIGHT/LEFT HEART CATH AND CORONARY ANGIOGRAPHY N/A 12/11/2019   Procedure: RIGHT/LEFT HEART CATH AND CORONARY ANGIOGRAPHY;  Surgeon: [name], MD;  Location: [facility] CV LAB;  Service: Cardiovascular;  Laterality: N/A;  . SUBMUCOSAL TATTOO INJECTION  03/02/2020   Procedure: SUBMUCOSAL TATTOO INJECTION;  Surgeon: Yetta Flock, MD;  Location: [facility] ENDOSCOPY;  Service: Gastroenterology;;  . Lia Foyer TATTOO INJECTION  05/31/2020   Procedure: SUBMUCOSAL TATTOO INJECTION;  Surgeon: Irene Shipper, MD;  Location: [facility];  Service: Endoscopy;;  . TUBAL LIGATION    . XI ROBOTIC ASSISTED LOWER ANTERIOR RESECTION N/A 05/22/2018   Procedure: XI ROBOTIC ASSISTED SIGMOID COLOECTOMY MOBILIZATION OF SPLENIC FLEXURE, FIREFLY ASSESSMENT OF PERFUSION;  Surgeon: Michael Boston, MD;  Location: [facility] ORS;  Service: General;  Laterality: N/A;  ERAS PATHWAY     OB History   No obstetric history on  file.     Family History  Problem Relation Age of Onset  . Asthma Father   . Heart attack Father   . Asthma Sister   . Lung cancer Sister   . Heart attack Mother   . Stroke Brother   . Colon cancer Neg Hx   . Esophageal cancer Neg Hx   . Pancreatic cancer Neg Hx   . Stomach cancer Neg Hx   . Liver disease Neg Hx     Social History   Tobacco Use  . Smoking status: Never Smoker  . Smokeless tobacco: Never Used  Vaping Use  . Vaping Use: Never used  Substance Use Topics  . Alcohol use: Not Currently    Comment: 01/03/2018 "couple drinks/month"  . Drug use: Not Currently    Types: Marijuana    Comment: VERY INTERMITTENT     Home Medications Prior to Admission medications   Medication Sig Start Date End Date Taking? Authorizing Provider  acetaminophen (TYLENOL) 650 MG CR tablet Take 650 mg by mouth every 8 (eight) hours as needed for pain.    [provider]  amiodarone (PACERONE) 200 MG tablet Take 1 tablet (200 mg total) by mouth daily. 07/27/20   Clegg, [name] D, NP  AURYXIA 1 GM 210 MG(Fe) tablet Take 420 mg by mouth 3 (three) times daily with meals. 08/31/20   [provider]  carvedilol (COREG) 3.125 MG tablet Take 1 tablet (3.125 mg total) by mouth 2 (two) times daily with a meal. 07/12/20   Lyda Jester M, PA-C  cromolyn (OPTICROM) 4 % ophthalmic solution Place 1 drop into both eyes 2 (two) times daily. 06/14/20   [provider]  gabapentin (NEURONTIN) 300 MG capsule Take 1 capsule (300 mg total) by mouth daily. 07/12/20   Lyda Jester M, PA-C  isosorbide-hydrALAZINE (BIDIL) 20-37.5 MG tablet Take 0.5 tablets by mouth 3 (three) times daily. 07/12/20   Consuelo Pandy, PA-C  levothyroxine (SYNTHROID) 137 MCG tablet Take 1 tablet (137 mcg total) by mouth daily before breakfast. 03/11/20   Billie Ruddy, MD  methocarbamol (ROBAXIN) 500 MG tablet Take 1 tablet (500 mg total) by mouth in the morning and at bedtime. 09/14/20   [name],  MD  nitroGLYCERIN (NITROSTAT) 0.4 MG SL tablet Place 0.4 mg under the tongue every 5 (five) minutes x 3 doses as needed for chest pain.    [provider]  oxyCODONE-acetaminophen (PERCOCET) 10-325 MG tablet Take 1 tablet by mouth every 6 (six) hours as needed for pain. 09/16/20 09/16/21  Setzer, Edman Circle, PA-C  traZODone (DESYREL) 50 MG tablet Take 50 mg by mouth at bedtime.    [provider]    Allergies    Patient has no known allergies.  Review of Systems   Review of Systems  Constitutional: Negative for chills, fatigue and fever.  HENT: Negative for congestion, ear pain, rhinorrhea and sore throat.   Eyes: Negative for pain and visual disturbance.  Respiratory: Negative for cough, shortness of breath and wheezing.   Cardiovascular: Negative for chest pain and palpitations.  Gastrointestinal: Negative for abdominal pain, diarrhea, nausea and vomiting.  Genitourinary: Negative for dysuria and hematuria.  Musculoskeletal: Negative for arthralgias, back pain and myalgias.  Skin: Negative for color change and rash.  Neurological: Negative for seizures, loss of consciousness, syncope and headaches.  All other systems reviewed and are negative.   Physical Exam Updated Vital Signs BP (!) 129/59   Pulse 65   Temp (!) 97.4 F (36.3 C)   Resp 20   SpO2 100%   Physical Exam Vitals and nursing note reviewed.  Constitutional:      General: She is in acute distress.     Appearance: She is well-developed. She is not ill-appearing.  HENT:     Head: Normocephalic and atraumatic.  Eyes:     Extraocular Movements: Extraocular movements intact.     Conjunctiva/sclera: Conjunctivae normal.     Pupils: Pupils are equal, round, and reactive to light.  Cardiovascular:     Rate and Rhythm: Normal rate and regular rhythm.     Pulses: Normal pulses.     Heart sounds: Normal heart sounds. No murmur heard.   Pulmonary:     Effort: Pulmonary effort is normal. No respiratory  distress.     Breath sounds: Normal breath sounds.  Abdominal:     Palpations: Abdomen is soft.     Tenderness: There is no abdominal tenderness.  Musculoskeletal:  Cervical back: Normal range of motion and neck supple.  Skin:    General: Skin is warm and dry.     Comments: Left upper arm surgical site with no bleeding, palpable thrill at fistula site, 2+ pulses in the left radial, underneath surgical scar is firm and raised/suspect hematoma  Neurological:     Mental Status: She is alert.     ED Results / Procedures / Treatments   Labs (all labs ordered are listed, but only abnormal results are displayed) Labs Reviewed  CBC WITH DIFFERENTIAL/PLATELET - Abnormal; Notable for the following components:      Result Value   RBC 3.26 (*)    Hemoglobin 9.7 (*)    HCT 30.1 (*)    RDW 17.0 (*)    Platelets 149 (*)    Eosinophils Absolute 0.6 (*)    All other components within normal limits  BASIC METABOLIC PANEL - Abnormal; Notable for the following components:   Sodium 132 (*)    Chloride 91 (*)    Glucose, Bld 158 (*)    BUN 25 (*)    Creatinine, Ser 5.48 (*)    GFR, Estimated 8 (*)    All other components within normal limits  RESP PANEL BY RT-PCR (FLU A&B, COVID) [id number]    EKG EKG Interpretation  Date/Time:  Friday Sep 24 2020 00:03:34 EDT Ventricular Rate:  72 PR Interval:  133 QRS Duration: 145 QT Interval:  503 QTC Calculation: 551 R Axis:   -86 Text Interpretation: Sinus rhythm IVCD, consider atypical RBBB No significant change since last tracing Confirmed by Ronnald Nian, Elenore Wanninger ([id number]) on 09/24/2020 12:19:52 AM   Radiology No results found.  Procedures Procedures   Medications Ordered in ED Medications  HYDROmorphone (DILAUDID) injection 1 mg (1 mg Intravenous Given 09/23/20 2351)  HYDROmorphone (DILAUDID) injection 1 mg (1 mg Intravenous Given 09/24/20 0133)    ED Course  I have reviewed the triage vital signs and the nursing notes.  Pertinent labs &  imaging results that were available during my care of the patient were reviewed by me and considered in my medical decision making (see chart for details).    MDM Rules/Calculators/A&P                          Morgan Clay is a 75 year old female with history of heart failure, CKD on dialysis who presents to the ED with pain at her AV fistula site in her left upper extremity.  Patient had second stage basilic vein surgery last week with vascular surgery.  She has had some pain and swelling since the surgery but increased over the last day or 2 with some bleeding from the surgical site.  Bleeding has stopped now.  She has large firmness and swelling just underneath the surgical scar.  She has a palpable thrill in her fistula.  She has good distal pulses.  Overall good strength and sensation in the left hand.  She is in significant discomfort.  Hemoglobin is slightly down from 12-9.7.  Otherwise lab work is unremarkable.  Talked with Dr. Donzetta Matters with vascular surgery who will see the patient in the morning.  At this time she is hemodynamically stable with no active bleeding.  Pain slightly under better control with 2 doses of IV Dilaudid but given her extreme discomfort and concern for postop complication will admit her to medicine.  This chart was dictated using voice recognition software.  Despite best efforts to proofread,  errors can occur which can change the documentation meaning.    Final Clinical Impression(s) / ED Diagnoses Final diagnoses:  Post-op pain  Hematoma    Rx / DC Orders ED Discharge Orders    None       Lennice Sites, DO 09/24/20 0207

## 2020-09-24 ENCOUNTER — Observation Stay (HOSPITAL_COMMUNITY): Payer: Medicare Other | Admitting: Certified Registered Nurse Anesthetist

## 2020-09-24 ENCOUNTER — Other Ambulatory Visit: Payer: Self-pay

## 2020-09-24 ENCOUNTER — Encounter (HOSPITAL_COMMUNITY)
Admission: EM | Disposition: A | Discharge status: Home / Self Care | Payer: Self-pay | Source: Home / Self Care | Attending: Internal Medicine

## 2020-09-24 ENCOUNTER — Encounter (HOSPITAL_COMMUNITY): Payer: Self-pay | Admitting: Internal Medicine

## 2020-09-24 DIAGNOSIS — E039 Hypothyroidism, unspecified: Secondary | ICD-10-CM | POA: Diagnosis present

## 2020-09-24 DIAGNOSIS — D62 Acute posthemorrhagic anemia: Secondary | ICD-10-CM | POA: Diagnosis present

## 2020-09-24 DIAGNOSIS — K7469 Other cirrhosis of liver: Secondary | ICD-10-CM | POA: Diagnosis not present

## 2020-09-24 DIAGNOSIS — N2581 Secondary hyperparathyroidism of renal origin: Secondary | ICD-10-CM | POA: Diagnosis not present

## 2020-09-24 DIAGNOSIS — Z825 Family history of asthma and other chronic lower respiratory diseases: Secondary | ICD-10-CM | POA: Diagnosis not present

## 2020-09-24 DIAGNOSIS — G8918 Other acute postprocedural pain: Secondary | ICD-10-CM | POA: Diagnosis not present

## 2020-09-24 DIAGNOSIS — Z79899 Other long term (current) drug therapy: Secondary | ICD-10-CM | POA: Diagnosis not present

## 2020-09-24 DIAGNOSIS — T148XXA Other injury of unspecified body region, initial encounter: Secondary | ICD-10-CM | POA: Diagnosis present

## 2020-09-24 DIAGNOSIS — Z20822 Contact with and (suspected) exposure to covid-19: Secondary | ICD-10-CM | POA: Diagnosis not present

## 2020-09-24 DIAGNOSIS — G4733 Obstructive sleep apnea (adult) (pediatric): Secondary | ICD-10-CM | POA: Diagnosis present

## 2020-09-24 DIAGNOSIS — Z801 Family history of malignant neoplasm of trachea, bronchus and lung: Secondary | ICD-10-CM | POA: Diagnosis not present

## 2020-09-24 DIAGNOSIS — E038 Other specified hypothyroidism: Secondary | ICD-10-CM | POA: Diagnosis not present

## 2020-09-24 DIAGNOSIS — I97638 Postprocedural hematoma of a circulatory system organ or structure following other circulatory system procedure: Secondary | ICD-10-CM

## 2020-09-24 DIAGNOSIS — Y832 Surgical operation with anastomosis, bypass or graft as the cause of abnormal reaction of the patient, or of later complication, without mention of misadventure at the time of the procedure: Secondary | ICD-10-CM | POA: Diagnosis present

## 2020-09-24 DIAGNOSIS — T82838A Hemorrhage of vascular prosthetic devices, implants and grafts, initial encounter: Secondary | ICD-10-CM

## 2020-09-24 DIAGNOSIS — Z992 Dependence on renal dialysis: Secondary | ICD-10-CM | POA: Diagnosis not present

## 2020-09-24 DIAGNOSIS — Z8619 Personal history of other infectious and parasitic diseases: Secondary | ICD-10-CM | POA: Diagnosis not present

## 2020-09-24 DIAGNOSIS — Z823 Family history of stroke: Secondary | ICD-10-CM | POA: Diagnosis not present

## 2020-09-24 DIAGNOSIS — Z8249 Family history of ischemic heart disease and other diseases of the circulatory system: Secondary | ICD-10-CM | POA: Diagnosis not present

## 2020-09-24 DIAGNOSIS — I132 Hypertensive heart and chronic kidney disease with heart failure and with stage 5 chronic kidney disease, or end stage renal disease: Secondary | ICD-10-CM | POA: Diagnosis not present

## 2020-09-24 DIAGNOSIS — Z8616 Personal history of COVID-19: Secondary | ICD-10-CM | POA: Diagnosis not present

## 2020-09-24 DIAGNOSIS — Z79890 Hormone replacement therapy (postmenopausal): Secondary | ICD-10-CM | POA: Diagnosis not present

## 2020-09-24 DIAGNOSIS — N179 Acute kidney failure, unspecified: Secondary | ICD-10-CM | POA: Diagnosis not present

## 2020-09-24 DIAGNOSIS — E1122 Type 2 diabetes mellitus with diabetic chronic kidney disease: Secondary | ICD-10-CM | POA: Diagnosis not present

## 2020-09-24 DIAGNOSIS — N186 End stage renal disease: Secondary | ICD-10-CM | POA: Diagnosis not present

## 2020-09-24 DIAGNOSIS — Z95810 Presence of automatic (implantable) cardiac defibrillator: Secondary | ICD-10-CM | POA: Diagnosis not present

## 2020-09-24 DIAGNOSIS — I4891 Unspecified atrial fibrillation: Secondary | ICD-10-CM | POA: Diagnosis not present

## 2020-09-24 DIAGNOSIS — I4510 Unspecified right bundle-branch block: Secondary | ICD-10-CM | POA: Diagnosis present

## 2020-09-24 DIAGNOSIS — I5042 Chronic combined systolic (congestive) and diastolic (congestive) heart failure: Secondary | ICD-10-CM | POA: Diagnosis not present

## 2020-09-24 DIAGNOSIS — J449 Chronic obstructive pulmonary disease, unspecified: Secondary | ICD-10-CM | POA: Diagnosis present

## 2020-09-24 DIAGNOSIS — I428 Other cardiomyopathies: Secondary | ICD-10-CM | POA: Diagnosis not present

## 2020-09-24 HISTORY — PX: HEMATOMA EVACUATION: SHX5118

## 2020-09-24 HISTORY — DX: Hemorrhage due to vascular prosthetic devices, implants and grafts, initial encounter: T82.838A

## 2020-09-24 HISTORY — DX: Acute posthemorrhagic anemia: D62

## 2020-09-24 LAB — BASIC METABOLIC PANEL
Anion gap: 14 (ref 5–15)
BUN: 25 mg/dL — ABNORMAL HIGH (ref 8–23)
CO2: 27 mmol/L (ref 22–32)
Calcium: 9.6 mg/dL (ref 8.9–10.3)
Chloride: 91 mmol/L — ABNORMAL LOW (ref 98–111)
Creatinine, Ser: 5.48 mg/dL — ABNORMAL HIGH (ref 0.44–1.00)
GFR, Estimated: 8 mL/min — ABNORMAL LOW (ref >60)
Glucose, Bld: 158 mg/dL — ABNORMAL HIGH (ref 70–99)
Potassium: 4.2 mmol/L (ref 3.5–5.1)
Sodium: 132 mmol/L — ABNORMAL LOW (ref 135–145)

## 2020-09-24 LAB — GLUCOSE, CAPILLARY
Glucose-Capillary: 97 mg/dL (ref 70–99)
Glucose-Capillary: 88 mg/dL (ref 70–99)
Glucose-Capillary: 121 mg/dL — ABNORMAL HIGH (ref 70–99)

## 2020-09-24 LAB — CBC
HCT: 29.4 % — ABNORMAL LOW (ref 36.0–46.0)
HCT: 30.3 % — ABNORMAL LOW (ref 36.0–46.0)
Hemoglobin: 9.5 g/dL — ABNORMAL LOW (ref 12.0–15.0)
Hemoglobin: 9.7 g/dL — ABNORMAL LOW (ref 12.0–15.0)
MCH: 29.9 pg (ref 26.0–34.0)
MCH: 29.8 pg (ref 26.0–34.0)
MCHC: 32.3 g/dL (ref 30.0–36.0)
MCHC: 32 g/dL (ref 30.0–36.0)
MCV: 92.5 fL (ref 80.0–100.0)
MCV: 92.9 fL (ref 80.0–100.0)
Platelets: 142 10*3/uL — ABNORMAL LOW (ref 150–400)
Platelets: 129 10*3/uL — ABNORMAL LOW (ref 150–400)
RBC: 3.18 MIL/uL — ABNORMAL LOW (ref 3.87–5.11)
RBC: 3.26 MIL/uL — ABNORMAL LOW (ref 3.87–5.11)
RDW: 17 % — ABNORMAL HIGH (ref 11.5–15.5)
RDW: 17.2 % — ABNORMAL HIGH (ref 11.5–15.5)
WBC: 7.6 10*3/uL (ref 4.0–10.5)
WBC: 6.9 10*3/uL (ref 4.0–10.5)
nRBC: 0 % (ref 0.0–0.2)
nRBC: 0 % (ref 0.0–0.2)

## 2020-09-24 LAB — CBC WITH DIFFERENTIAL/PLATELET
Abs Immature Granulocytes: 0.06 10*3/uL (ref 0.00–0.07)
Basophils Absolute: 0 10*3/uL (ref 0.0–0.1)
Basophils Relative: 1 %
Eosinophils Absolute: 0.6 10*3/uL — ABNORMAL HIGH (ref 0.0–0.5)
Eosinophils Relative: 9 %
HCT: 30.1 % — ABNORMAL LOW (ref 36.0–46.0)
Hemoglobin: 9.7 g/dL — ABNORMAL LOW (ref 12.0–15.0)
Immature Granulocytes: 1 %
Lymphocytes Relative: 19 %
Lymphs Abs: 1.2 10*3/uL (ref 0.7–4.0)
MCH: 29.8 pg (ref 26.0–34.0)
MCHC: 32.2 g/dL (ref 30.0–36.0)
MCV: 92.3 fL (ref 80.0–100.0)
Monocytes Absolute: 0.8 10*3/uL (ref 0.1–1.0)
Monocytes Relative: 13 %
Neutro Abs: 3.7 10*3/uL (ref 1.7–7.7)
Neutrophils Relative %: 57 %
Platelets: 149 10*3/uL — ABNORMAL LOW (ref 150–400)
RBC: 3.26 MIL/uL — ABNORMAL LOW (ref 3.87–5.11)
RDW: 17 % — ABNORMAL HIGH (ref 11.5–15.5)
WBC: 6.3 10*3/uL (ref 4.0–10.5)
nRBC: 0 % (ref 0.0–0.2)

## 2020-09-24 LAB — RESP PANEL BY RT-PCR (FLU A&B, COVID) ARPGX2
Influenza A by PCR: NEGATIVE
Influenza B by PCR: NEGATIVE
SARS Coronavirus 2 by RT PCR: NEGATIVE

## 2020-09-24 LAB — TYPE AND SCREEN
ABO/RH(D): O POS
Antibody Screen: NEGATIVE

## 2020-09-24 LAB — CBG MONITORING, ED: Glucose-Capillary: 82 mg/dL (ref 70–99)

## 2020-09-24 SURGERY — EVACUATION HEMATOMA
Anesthesia: Monitor Anesthesia Care | Laterality: Left

## 2020-09-24 MED ORDER — MORPHINE SULFATE (PF) 2 MG/ML IV SOLN
1.0000 mg | INTRAVENOUS | Status: DC | PRN
Start: 2020-09-24 — End: 2020-09-25
  Administered 2020-09-24 – 2020-09-25 (×4): 1 mg via INTRAVENOUS
  Filled 2020-09-24 (×3): qty 1

## 2020-09-24 MED ORDER — FENTANYL CITRATE (PF) 250 MCG/5ML IJ SOLN
INTRAMUSCULAR | Status: DC | PRN
  Administered 2020-09-24 (×2): 50 ug via INTRAVENOUS

## 2020-09-24 MED ORDER — OXYCODONE HCL 5 MG PO TABS
ORAL_TABLET | ORAL | Status: AC
  Filled 2020-09-24: qty 1

## 2020-09-24 MED ORDER — CARVEDILOL 3.125 MG PO TABS
3.1250 mg | ORAL_TABLET | Freq: Two times a day (BID) | ORAL | Status: DC
  Administered 2020-09-24 – 2020-09-26 (×3): 3.125 mg via ORAL
  Filled 2020-09-24 (×3): qty 1

## 2020-09-24 MED ORDER — CHLORHEXIDINE GLUCONATE CLOTH 2 % EX PADS
6.0000 | MEDICATED_PAD | Freq: Every day | CUTANEOUS | Status: DC
  Administered 2020-09-24 – 2020-09-26 (×2): 6 via TOPICAL

## 2020-09-24 MED ORDER — STERILE WATER FOR IRRIGATION IR SOLN
Status: DC | PRN
  Administered 2020-09-24: 1000 mL

## 2020-09-24 MED ORDER — FENTANYL CITRATE (PF) 250 MCG/5ML IJ SOLN
INTRAMUSCULAR | Status: AC
  Filled 2020-09-24: qty 5

## 2020-09-24 MED ORDER — GABAPENTIN 300 MG PO CAPS
300.0000 mg | ORAL_CAPSULE | Freq: Every day | ORAL | Status: DC
  Administered 2020-09-25 – 2020-09-26 (×2): 300 mg via ORAL
  Filled 2020-09-24 (×2): qty 1

## 2020-09-24 MED ORDER — LIDOCAINE HCL 1 % IJ SOLN
INTRAMUSCULAR | Status: AC
  Filled 2020-09-24: qty 20

## 2020-09-24 MED ORDER — NITROGLYCERIN 0.4 MG SL SUBL: 0.4000 mg | SUBLINGUAL_TABLET | SUBLINGUAL | Status: DC | PRN

## 2020-09-24 MED ORDER — CHLORHEXIDINE GLUCONATE 0.12 % MT SOLN
15.0000 mL | Freq: Once | OROMUCOSAL | Status: AC
  Administered 2020-09-24: 15 mL via OROMUCOSAL
  Filled 2020-09-24: qty 15

## 2020-09-24 MED ORDER — FENTANYL CITRATE (PF) 100 MCG/2ML IJ SOLN: 25.0000 ug | INTRAMUSCULAR | Status: DC | PRN

## 2020-09-24 MED ORDER — CEFAZOLIN SODIUM-DEXTROSE 2-4 GM/100ML-% IV SOLN
INTRAVENOUS | Status: AC
  Filled 2020-09-24: qty 100

## 2020-09-24 MED ORDER — FENTANYL CITRATE (PF) 100 MCG/2ML IJ SOLN
INTRAMUSCULAR | Status: AC
  Administered 2020-09-24: 25 ug via INTRAVENOUS
  Filled 2020-09-24: qty 2

## 2020-09-24 MED ORDER — CHLORHEXIDINE GLUCONATE CLOTH 2 % EX PADS: 6.0000 | MEDICATED_PAD | Freq: Every day | CUTANEOUS | Status: DC

## 2020-09-24 MED ORDER — SODIUM CHLORIDE 0.9 % IV SOLN
INTRAVENOUS | Status: AC
  Filled 2020-09-24: qty 1.2

## 2020-09-24 MED ORDER — PROPOFOL 10 MG/ML IV BOLUS
INTRAVENOUS | Status: DC | PRN
  Administered 2020-09-24: 70 mg via INTRAVENOUS

## 2020-09-24 MED ORDER — AMIODARONE HCL 200 MG PO TABS
200.0000 mg | ORAL_TABLET | Freq: Every day | ORAL | Status: DC
  Administered 2020-09-25 – 2020-09-26 (×2): 200 mg via ORAL
  Filled 2020-09-24 (×2): qty 1

## 2020-09-24 MED ORDER — MIDAZOLAM HCL 2 MG/2ML IJ SOLN
INTRAMUSCULAR | Status: AC
  Filled 2020-09-24: qty 2

## 2020-09-24 MED ORDER — CROMOLYN SODIUM 4 % OP SOLN
1.0000 [drp] | Freq: Two times a day (BID) | OPHTHALMIC | Status: DC
  Administered 2020-09-25 – 2020-09-26 (×2): 1 [drp] via OPHTHALMIC
  Filled 2020-09-24 (×2): qty 10

## 2020-09-24 MED ORDER — LIDOCAINE 2% (20 MG/ML) 5 ML SYRINGE
INTRAMUSCULAR | Status: AC
  Filled 2020-09-24: qty 5

## 2020-09-24 MED ORDER — ONDANSETRON HCL 4 MG/2ML IJ SOLN: 4.0000 mg | Freq: Four times a day (QID) | INTRAMUSCULAR | Status: DC | PRN

## 2020-09-24 MED ORDER — PHENYLEPHRINE 40 MCG/ML (10ML) SYRINGE FOR IV PUSH (FOR BLOOD PRESSURE SUPPORT)
PREFILLED_SYRINGE | INTRAVENOUS | Status: DC | PRN
Start: 2020-09-24 — End: 2020-09-24
  Administered 2020-09-24 (×2): 80 ug via INTRAVENOUS

## 2020-09-24 MED ORDER — SUCCINYLCHOLINE CHLORIDE 200 MG/10ML IV SOSY
PREFILLED_SYRINGE | INTRAVENOUS | Status: DC | PRN
  Administered 2020-09-24: 100 mg via INTRAVENOUS

## 2020-09-24 MED ORDER — PHENYLEPHRINE 40 MCG/ML (10ML) SYRINGE FOR IV PUSH (FOR BLOOD PRESSURE SUPPORT)
PREFILLED_SYRINGE | INTRAVENOUS | Status: AC
  Filled 2020-09-24: qty 10

## 2020-09-24 MED ORDER — HYDROMORPHONE HCL 1 MG/ML IJ SOLN
1.0000 mg | Freq: Once | INTRAMUSCULAR | Status: AC
Start: 2020-09-24 — End: 2020-09-24
  Administered 2020-09-24: 1 mg via INTRAVENOUS
  Filled 2020-09-24: qty 1

## 2020-09-24 MED ORDER — ISOSORB DINITRATE-HYDRALAZINE 20-37.5 MG PO TABS
0.5000 | ORAL_TABLET | Freq: Three times a day (TID) | ORAL | Status: DC
  Administered 2020-09-24 – 2020-09-26 (×6): 0.5 via ORAL
  Filled 2020-09-24 (×9): qty 0.5

## 2020-09-24 MED ORDER — OXYCODONE HCL 5 MG/5ML PO SOLN: 5.0000 mg | Freq: Once | ORAL | Status: AC | PRN

## 2020-09-24 MED ORDER — LEVOTHYROXINE SODIUM 25 MCG PO TABS
137.0000 ug | ORAL_TABLET | Freq: Every day | ORAL | Status: DC
  Administered 2020-09-25 – 2020-09-26 (×2): 137 ug via ORAL
  Filled 2020-09-24 (×3): qty 1

## 2020-09-24 MED ORDER — OXYCODONE HCL 5 MG PO TABS
5.0000 mg | ORAL_TABLET | Freq: Once | ORAL | Status: AC | PRN
  Administered 2020-09-24: 5 mg via ORAL

## 2020-09-24 MED ORDER — CEFAZOLIN SODIUM-DEXTROSE 2-3 GM-%(50ML) IV SOLR
INTRAVENOUS | Status: DC | PRN
  Administered 2020-09-24: 2 g via INTRAVENOUS

## 2020-09-24 MED ORDER — SODIUM CHLORIDE 0.9 % IV SOLN
INTRAVENOUS | Status: DC | PRN
  Administered 2020-09-24: 500 mL

## 2020-09-24 MED ORDER — LIDOCAINE 2% (20 MG/ML) 5 ML SYRINGE
INTRAMUSCULAR | Status: DC | PRN
  Administered 2020-09-24: 20 mg via INTRAVENOUS

## 2020-09-24 MED ORDER — FERRIC CITRATE 1 GM 210 MG(FE) PO TABS
420.0000 mg | ORAL_TABLET | Freq: Three times a day (TID) | ORAL | Status: DC
  Administered 2020-09-24 – 2020-09-26 (×4): 420 mg via ORAL
  Filled 2020-09-24 (×9): qty 2

## 2020-09-24 MED ORDER — SODIUM CHLORIDE 0.9 % IV SOLN: INTRAVENOUS | Status: DC

## 2020-09-24 MED ORDER — ACETAMINOPHEN 325 MG PO TABS
650.0000 mg | ORAL_TABLET | Freq: Four times a day (QID) | ORAL | Status: DC | PRN
Start: 2020-09-24 — End: 2020-09-25

## 2020-09-24 MED ORDER — ONDANSETRON HCL 4 MG/2ML IJ SOLN
INTRAMUSCULAR | Status: DC | PRN
  Administered 2020-09-24: 4 mg via INTRAVENOUS

## 2020-09-24 MED ORDER — ORAL CARE MOUTH RINSE: 15.0000 mL | Freq: Once | OROMUCOSAL | Status: AC

## 2020-09-24 MED ORDER — SUCCINYLCHOLINE CHLORIDE 200 MG/10ML IV SOSY
PREFILLED_SYRINGE | INTRAVENOUS | Status: AC
  Filled 2020-09-24: qty 10

## 2020-09-24 MED ORDER — TRAZODONE HCL 50 MG PO TABS
50.0000 mg | ORAL_TABLET | Freq: Every day | ORAL | Status: DC
  Administered 2020-09-24 – 2020-09-25 (×2): 50 mg via ORAL
  Filled 2020-09-24 (×2): qty 1

## 2020-09-24 SURGICAL SUPPLY — 48 items
BAG DECANTER FOR FLEXI CONT (MISCELLANEOUS) ×1 IMPLANT
BANDAGE ESMARK 6X9 LF (GAUZE/BANDAGES/DRESSINGS) IMPLANT
BNDG CMPR 9X6 STRL LF SNTH (GAUZE/BANDAGES/DRESSINGS)
BNDG ELASTIC 4X5.8 VLCR STR LF (GAUZE/BANDAGES/DRESSINGS) ×1 IMPLANT
BNDG ESMARK 6X9 LF (GAUZE/BANDAGES/DRESSINGS)
BNDG GAUZE ELAST 4 BULKY (GAUZE/BANDAGES/DRESSINGS) ×1 IMPLANT
CANISTER SUCT 3000ML PPV (MISCELLANEOUS) ×2 IMPLANT
CLIP LIGATING EXTRA MED SLVR (CLIP) ×1 IMPLANT
COVER WAND RF STERILE (DRAPES) ×1 IMPLANT
CUFF TOURN SGL QUICK 18X4 (TOURNIQUET CUFF) IMPLANT
CUFF TOURN SGL QUICK 24 (TOURNIQUET CUFF)
CUFF TOURN SGL QUICK 34 (TOURNIQUET CUFF)
CUFF TOURN SGL QUICK 42 (TOURNIQUET CUFF) IMPLANT
CUFF TRNQT CYL 24X4X16.5-23 (TOURNIQUET CUFF) IMPLANT
CUFF TRNQT CYL 34X4.125X (TOURNIQUET CUFF) IMPLANT
DERMABOND ADVANCED (GAUZE/BANDAGES/DRESSINGS) ×1
DERMABOND ADVANCED .7 DNX12 (GAUZE/BANDAGES/DRESSINGS) IMPLANT
DRAIN CHANNEL 15F RND FF W/TCR (WOUND CARE) IMPLANT
ELECT REM PT RETURN 9FT ADLT (ELECTROSURGICAL) ×2
ELECTRODE REM PT RTRN 9FT ADLT (ELECTROSURGICAL) ×1 IMPLANT
EVACUATOR SILICONE 100CC (DRAIN) IMPLANT
GLOVE BIOGEL PI IND STRL 7.5 (GLOVE) ×1 IMPLANT
GLOVE BIOGEL PI INDICATOR 7.5 (GLOVE) ×1
GLOVE SURG SS PI 7.5 STRL IVOR (GLOVE) ×2 IMPLANT
GOWN STRL REUS W/ TWL LRG LVL3 (GOWN DISPOSABLE) ×1 IMPLANT
GOWN STRL REUS W/ TWL XL LVL3 (GOWN DISPOSABLE) ×3 IMPLANT
GOWN STRL REUS W/TWL LRG LVL3 (GOWN DISPOSABLE) ×2
GOWN STRL REUS W/TWL XL LVL3 (GOWN DISPOSABLE) ×4
KIT BASIN OR (CUSTOM PROCEDURE TRAY) ×2 IMPLANT
KIT TURNOVER KIT B (KITS) ×2 IMPLANT
NS IRRIG 1000ML POUR BTL (IV SOLUTION) ×2 IMPLANT
PACK CV ACCESS (CUSTOM PROCEDURE TRAY) IMPLANT
PACK GENERAL/GYN (CUSTOM PROCEDURE TRAY) ×1 IMPLANT
PACK PERIPHERAL VASCULAR (CUSTOM PROCEDURE TRAY) IMPLANT
PACK UNIVERSAL I (CUSTOM PROCEDURE TRAY) IMPLANT
PAD ARMBOARD 7.5X6 YLW CONV (MISCELLANEOUS) ×4 IMPLANT
STAPLER VISISTAT 35W (STAPLE) IMPLANT
SUT MNCRL AB 4-0 PS2 18 (SUTURE) IMPLANT
SUT PROLENE 5 0 C 1 24 (SUTURE) IMPLANT
SUT PROLENE 6 0 BV (SUTURE) IMPLANT
SUT VIC AB 2-0 CT1 27 (SUTURE)
SUT VIC AB 2-0 CT1 TAPERPNT 27 (SUTURE) IMPLANT
SUT VIC AB 3-0 SH 27 (SUTURE)
SUT VIC AB 3-0 SH 27X BRD (SUTURE) IMPLANT
TOWEL GREEN STERILE (TOWEL DISPOSABLE) ×2 IMPLANT
TRAY FOLEY MTR SLVR 16FR STAT (SET/KITS/TRAYS/PACK) IMPLANT
UNDERPAD 30X36 HEAVY ABSORB (UNDERPADS AND DIAPERS) ×2 IMPLANT
WATER STERILE IRR 1000ML POUR (IV SOLUTION) ×2 IMPLANT

## 2020-09-24 NOTE — OR Nursing (Signed)
Left upper arm fistula with good thrill felt.

## 2020-09-24 NOTE — Op Note (Signed)
    OPERATIVE REPORT  DATE OF SURGERY: 09/24/2020  PATIENT: Morgan Clay, 75 y.o. female MRN: [id number]  DOB: 1946-01-23  PRE-OPERATIVE DIAGNOSIS: Hematoma left arm status post second stage basilic vein transposition fistula  POST-OPERATIVE DIAGNOSIS:  Same  PROCEDURE: Evacuation hematoma left arm status post second stage basilic vein transposition fistula  SURGEON:  Curt Jews, M.D.  PHYSICIAN ASSISTANT: Nurse  The assistant was needed for exposure and to expedite the case  ANESTHESIA: General  EBL: per anesthesia record  Total I/O In: 200 [I.V.:200] Out: -   BLOOD ADMINISTERED: none  DRAINS: none  SPECIMEN: none  COUNTS CORRECT:  YES  PATIENT DISPOSITION:  PACU - hemodynamically stable  PROCEDURE DETAILS: Patient has 1 week status post second stage transposition of her left basilic vein for fistula.  She had presented with increased swelling and bleeding from her upper arm incisions.  This appeared to be from the vein harvest site.  She was taken to the operating room for evacuation  The left arm was prepped draped in usual sterile fashion.  The patient had 3 incisions 1 at the antecubital space 1 at the axilla and 1 in the mid upper arm.  The mid incision was opened and there was a very large hematoma that was present.  There was no active bleeding.  The hematoma extended from the axilla to the antecubital space.  Hematoma was removed from the mid incision.  The wound was irrigated with saline and all residual hematoma was removed.  There was no evidence of active bleeding.  The wound was closed with 3-0 Vicryl in the subcutaneous and subcuticular tissue.  Sterile dressing with Kerlix and 4 inch Ace was placed over the upper arm.  The patient was transferred to the recovery room in stable condition   Rosetta Posner, M.D., [facility] 09/24/2020 10:23 AM  Note: Portions of this report may have been transcribed using voice recognition software.  Every effort has been made  to ensure accuracy; however, inadvertent computerized transcription errors may still be present.

## 2020-09-24 NOTE — Interval H&P Note (Signed)
History and Physical Interval Note:  09/24/2020 9:14 AM  Morgan Clay  has presented today for surgery, with the diagnosis of Arm Hematoma.  The various methods of treatment have been discussed with the patient and family. After consideration of risks, benefits and other options for treatment, the patient has consented to  Procedure(s): EVACUATION HEMATOMA ARM (N/A) as a surgical intervention.  The patient's history has been reviewed, patient examined, no change in status, stable for surgery.  I have reviewed the patient's chart and labs.  Questions were answered to the patient's satisfaction.     Curt Jews

## 2020-09-24 NOTE — ED Notes (Signed)
Dressing soaked through with blood at incision site. Dr. Hal Hope at bedside to assess. Pressure applied with new gauze wrapping, bleeding controlled at this time.

## 2020-09-24 NOTE — H&P (View-Only) (Signed)
Hospital Consult    Reason for Consult:  Left arm hematoma  Referring Physician:  Dr. Ronnald Nian MRN #:  [id number]  History of Present Illness: This is a 75 y.o. female status post left upper arm 6 days basilic vein fistula.  She says she initially did well.  She is currently dialyzing via right IJ catheter.  She subsequently had drainage from the upper incision of her left arm.  She also has some numbness extending down to her hand with swelling of her upper arm.  She came to the hospital yesterday when the swelling was increasing.  She denies fevers or chills.  She does not take blood thinners.  Past Medical History:  Diagnosis Date  . A-fib ([place])    on Eliquis  . AICD (automatic cardioverter/defibrillator) present 10/06/2015  . Anemia   . Angiodysplasia of small intestine ([place]) 06/10/2020   Ileum - seen on capsule endoscopy 05/2020 - ablated at colonoscopy  . ARF (acute renal failure) ([place])   . Arthritis    "qwhere" (01/03/2018)  . Asthma    reports mild asthma since childhood - had COPD on dx list from prior PCP  . Chronic bronchitis ([place])    "get it most years; not this past year though" (01/03/2018)  . Chronic kidney disease    "? stage;  followed by [facility]; [name]" (01/03/2018)  . Chronic systolic congestive heart failure ([place])   . Cirrhosis ([place]) 09/14/2020   dx by Dr Silvano Rusk   . COVID-19 05/03/2020  . DEPRESSION 12/17/2009   Annotation: PHQ-9 score = 14 done on 12/17/2009 Qualifier: Diagnosis of  By: Hassell Done FNP, Tori Milks    . Diabetes mellitus without complication (HCC)    DIET CONTROLLED   . Diverticulosis   . Enteritis   . FIBROIDS, UTERUS 03/05/2008  . GI bleeding 01/03/2018  . Glaucoma   . Gout   . Hepatitis C    HEPATITIS C - s/p treatment with Harvoni, saw hepatology, Dawn Drazek  . History of blood transfusion ~ 11/2017  . Hyperlipemia 12/06/2012  . HYPERTENSION, BENIGN 04/24/2007  . Hypothyroidism   . LBBB (left bundle branch block)   . Lower GI  bleeding    "been dealing w/it since 07/2017" (01/03/2018)  . Nonischemic cardiomyopathy ([place])   . OBESITY 05/27/2009  . OBSTRUCTIVE SLEEP APNEA 11/14/2007   no CPAP  . OSTEOPENIA 09/30/2008  . Pain and swelling of left upper extremity 08/08/2017  . Personal history of other infectious and parasitic disease    Hepatitis B  . Pneumonia    "several times" (01/03/2018)  . Pulmonary edema, acute ([place]) 08/09/2017    Past Surgical History:  Procedure Laterality Date  . APPENDECTOMY    . AV FISTULA PLACEMENT Left 08/05/2020   Procedure: LEFT UPPER EXTREMITY ARTERIOVENOUS (AV) FISTULA CREATION;  Surgeon: Cherre Robins, MD;  Location: [place];  Service: Vascular;  Laterality: Left;  . BASCILIC VEIN TRANSPOSITION Left 09/16/2020   Procedure: LEFT SECOND STAGE BASCILIC VEIN TRANSPOSITION;  Surgeon: Cherre Robins, MD;  Location: [facility] OR;  Service: Vascular;  Laterality: Left;  PERIPHERAL NERVE BLOCK  . CARDIAC CATHETERIZATION    . CARDIOVERSION N/A 12/14/2014   Procedure: CARDIOVERSION;  Surgeon: Dorothy Spark, MD;  Location: [facility] ENDOSCOPY;  Service: Cardiovascular;  Laterality: N/A;  . CARDIOVERSION N/A 02/26/2017   Procedure: CARDIOVERSION;  Surgeon: Evans Lance, MD;  Location: [place] CV LAB;  Service: Cardiovascular;  Laterality: N/A;  . CARDIOVERSION N/A 07/24/2017   Procedure:  CARDIOVERSION;  Surgeon: Acie Fredrickson Wonda Cheng, MD;  Location: [facility] ENDOSCOPY;  Service: Cardiovascular;  Laterality: N/A;  . CARDIOVERSION N/A 02/12/2020   Procedure: CARDIOVERSION;  Surgeon: [name], MD;  Location: [facility] ENDOSCOPY;  Service: Cardiovascular;  Laterality: N/A;  . COLONOSCOPY N/A 11/08/2017   Procedure: COLONOSCOPY;  Surgeon: Milus Banister, MD;  Location: [facility] ENDOSCOPY;  Service: Endoscopy;  Laterality: N/A;  . COLONOSCOPY WITH PROPOFOL N/A 05/31/2020   Procedure: COLONOSCOPY WITH PROPOFOL;  Surgeon: Irene Shipper, MD;  Location: [facility] ENDOSCOPY;  Service: Endoscopy;  Laterality: N/A;  . DILATION AND  CURETTAGE OF UTERUS    . ENTEROSCOPY N/A 03/02/2020   Procedure: ENTEROSCOPY;  Surgeon: Yetta Flock, MD;  Location: [facility] ENDOSCOPY;  Service: Gastroenterology;  Laterality: N/A;  . EP IMPLANTABLE DEVICE N/A 10/06/2015   Procedure: BIV ICD Generator Changeout;  Surgeon: [name], MD;  Location: [place] CV LAB;  Service: Cardiovascular;  Laterality: N/A;  . ESOPHAGOGASTRODUODENOSCOPY N/A 10/26/2017   Procedure: ESOPHAGOGASTRODUODENOSCOPY (EGD);  Surgeon: Ladene Artist, MD;  Location: [facility] ENDOS[facility];  Service: Endoscopy;  Laterality: N/A;  . ESOPHAGOGASTRODUODENOSCOPY (EGD) WITH PROPOFOL N/A 11/07/2017   Procedure: ESOPHAGOGASTRODUODENOSCOPY (EGD) WITH PROPOFOL;  Surgeon: Milus Banister, MD;  Location: [facility] ENDOSCOPY;  Service: Endoscopy;  Laterality: N/A;  . ESOPHAGOGASTRODUODENOSCOPY (EGD) WITH PROPOFOL N/A 05/29/2020   Procedure: ESOPHAGOGASTRODUODENOSCOPY (EGD) WITH PROPOFOL;  Surgeon: Doran Stabler, MD;  Location: [place];  Service: Gastroenterology;  Laterality: N/A;  . GIVENS CAPSULE STUDY N/A 05/19/2020   Procedure: GIVENS CAPSULE STUDY;  Surgeon: [name], MD;  Location: [place];  Service: Endoscopy;  Laterality: N/A;  .adm for obs since pacemaker, PA wil enter order and see pt  . GIVENS CAPSULE STUDY N/A 05/29/2020   Procedure: GIVENS CAPSULE STUDY;  Surgeon: Doran Stabler, MD;  Location: [place];  Service: Gastroenterology;  Laterality: N/A;  . HOT HEMOSTASIS N/A 10/26/2017   Procedure: HOT HEMOSTASIS (ARGON PLASMA COAGULATION/BICAP);  Surgeon: Ladene Artist, MD;  Location: [facility] ENDOS[facility];  Service: Endoscopy;  Laterality: N/A;  . HOT HEMOSTASIS N/A 03/02/2020   Procedure: HOT HEMOSTASIS (ARGON PLASMA COAGULATION/BICAP);  Surgeon: Yetta Flock, MD;  Location: [facility] ENDOSCOPY;  Service: Gastroenterology;  Laterality: N/A;  . HOT HEMOSTASIS N/A 05/31/2020   Procedure: HOT HEMOSTASIS (ARGON PLASMA COAGULATION/BICAP);  Surgeon: Irene Shipper, MD;   Location: [facility] ENDOSCOPY;  Service: Endoscopy;  Laterality: N/A;  . INSERT / REPLACE / REMOVE PACEMAKER  ?2008  . IR PERC TUN PERIT CATH WO PORT S&I Dartha Lodge  07/05/2020  . POLYPECTOMY  11/08/2017   Procedure: POLYPECTOMY;  Surgeon: Milus Banister, MD;  Location: [facility] ENDOSCOPY;  Service: Endoscopy;;  . PROCTOSCOPY N/A 05/22/2018   Procedure: RIGID PROCTOSCOPY;  Surgeon: Michael Boston, MD;  Location: [facility] ORS;  Service: General;  Laterality: N/A;  . RIGHT HEART CATH N/A 02/13/2018   Procedure: RIGHT HEART CATH;  Surgeon: [name], MD;  Location: [place] CV LAB;  Service: Cardiovascular;  Laterality: N/A;  . RIGHT HEART CATH N/A 06/23/2020   Procedure: RIGHT HEART CATH;  Surgeon: [name], MD;  Location: [place] CV LAB;  Service: Cardiovascular;  Laterality: N/A;  . RIGHT/LEFT HEART CATH AND CORONARY ANGIOGRAPHY N/A 12/11/2019   Procedure: RIGHT/LEFT HEART CATH AND CORONARY ANGIOGRAPHY;  Surgeon: [name], MD;  Location: [place] CV LAB;  Service: Cardiovascular;  Laterality: N/A;  . SUBMUCOSAL TATTOO INJECTION  03/02/2020   Procedure: SUBMUCOSAL TATTOO INJECTION;  Surgeon: Havery Moros,  Carlota Raspberry, MD;  Location: [facility] ENDOSCOPY;  Service: Gastroenterology;;  . Lia Foyer TATTOO INJECTION  05/31/2020   Procedure: SUBMUCOSAL TATTOO INJECTION;  Surgeon: Irene Shipper, MD;  Location: [place];  Service: Endoscopy;;  . TUBAL LIGATION    . XI ROBOTIC ASSISTED LOWER ANTERIOR RESECTION N/A 05/22/2018   Procedure: XI ROBOTIC ASSISTED SIGMOID COLOECTOMY MOBILIZATION OF SPLENIC FLEXURE, FIREFLY ASSESSMENT OF PERFUSION;  Surgeon: Michael Boston, MD;  Location: [facility] ORS;  Service: General;  Laterality: N/A;  ERAS PATHWAY    No Known Allergies  Prior to Admission medications   Medication Sig Start Date End Date Taking? Authorizing Provider  acetaminophen (TYLENOL) 650 MG CR tablet Take 650 mg by mouth every 8 (eight) hours as needed for pain.    [provider]  amiodarone  (PACERONE) 200 MG tablet Take 1 tablet (200 mg total) by mouth daily. 07/27/20   Clegg, [name] D, NP  AURYXIA 1 GM 210 MG(Fe) tablet Take 420 mg by mouth 3 (three) times daily with meals. 08/31/20   [provider]  carvedilol (COREG) 3.125 MG tablet Take 1 tablet (3.125 mg total) by mouth 2 (two) times daily with a meal. 07/12/20   Lyda Jester M, PA-C  cromolyn (OPTICROM) 4 % ophthalmic solution Place 1 drop into both eyes 2 (two) times daily. 06/14/20   [provider]  gabapentin (NEURONTIN) 300 MG capsule Take 1 capsule (300 mg total) by mouth daily. 07/12/20   Lyda Jester M, PA-C  isosorbide-hydrALAZINE (BIDIL) 20-37.5 MG tablet Take 0.5 tablets by mouth 3 (three) times daily. 07/12/20   Consuelo Pandy, PA-C  levothyroxine (SYNTHROID) 137 MCG tablet Take 1 tablet (137 mcg total) by mouth daily before breakfast. 03/11/20   Billie Ruddy, MD  methocarbamol (ROBAXIN) 500 MG tablet Take 1 tablet (500 mg total) by mouth in the morning and at bedtime. 09/14/20   [name], MD  nitroGLYCERIN (NITROSTAT) 0.4 MG SL tablet Place 0.4 mg under the tongue every 5 (five) minutes x 3 doses as needed for chest pain.    [provider]  oxyCODONE-acetaminophen (PERCOCET) 10-325 MG tablet Take 1 tablet by mouth every 6 (six) hours as needed for pain. 09/16/20 09/16/21  Setzer, Edman [address], PA-C  traZODone (DESYREL) 50 MG tablet Take 50 mg by mouth at bedtime.    [provider]    Social History   Socioeconomic History  . Marital status: Divorced    Spouse name: Not on file  . Number of children: 1  . Years of education: 64  . Highest education level: Not on file  Occupational History  . Not on file  Tobacco Use  . Smoking status: Never Smoker  . Smokeless tobacco: Never Used  Vaping Use  . Vaping Use: Never used  Substance and Sexual Activity  . Alcohol use: Not Currently    Comment: 01/03/2018 "couple drinks/month"  . Drug use: Not Currently    Types:  Marijuana    Comment: VERY INTERMITTENT   . Sexual activity: Not Currently  Other Topics Concern  . Not on file  Social History Narrative   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      Home Situation: lives alone      Spiritual Beliefs: Christian - [employer]      Lifestyle: no regular exercise; poor diet      She is divorced at least one adult child   Never smoker    rare alcohol to occasional at best  no substance abuse         Social Determinants of Health   Financial Resource Strain: Low Risk   . Difficulty of Paying Living Expenses: Not hard at all  Food Insecurity: No Food Insecurity  . Worried About Charity fundraiser in the Last Year: Never true  . Ran Out of Food in the Last Year: Never true  Transportation Needs: No Transportation Needs  . Lack of Transportation (Medical): No  . Lack of Transportation (Non-Medical): No  Physical Activity: Not on file  Stress: Not on file  Social Connections: Not on file  Intimate Partner Violence: Not on file     Family History  Problem Relation Age of Onset  . Asthma Father   . Heart attack Father   . Asthma Sister   . Lung cancer Sister   . Heart attack Mother   . Stroke Brother   . Colon cancer Neg Hx   . Esophageal cancer Neg Hx   . Pancreatic cancer Neg Hx   . Stomach cancer Neg Hx   . Liver disease Neg Hx     ROS:  Cardiovascular: []  chest pain/pressure []  palpitations []  SOB lying flat []  DOE []  pain in legs while walking []  pain in legs at rest []  pain in legs at night []  non-healing ulcers []  hx of DVT []  swelling in legs  Pulmonary: []  productive cough []  asthma/wheezing []  home O2  Neurologic: []  weakness in []  arms []  legs []  numbness in []  arms []  legs []  hx of CVA []  mini stroke [] difficulty speaking or slurred speech []  temporary loss of vision in one eye []  dizziness  Hematologic: []  hx of cancer []  bleeding problems []  problems with blood clotting easily  Endocrine:   []   diabetes []  thyroid disease  GI []  vomiting blood []  blood in stool  GU: []  CKD/renal failure []  HD--[]  M/W/F or []  T/T/S []  burning with urination []  blood in urine  Psychiatric: []  anxiety []  depression  Musculoskeletal: []  arthritis [x]  arm swelling  Integumentary: []  rashes []  ulcers  Constitutional: []  fever []  chills   Physical Examination  Vitals:   09/24/20 0615 09/24/20 0630  BP: 137/69 (!) 144/57  Pulse: 68 69  Resp: 10 13  Temp:    SpO2: 100% 100%   There is no height or weight on file to calculate BMI.  General:  nad HENT: WNL, normocephalic Pulmonary: normal non-labored breathing= Abdomen: soft, NT/ND, no masses Extremities: Left upper arm there is drainage from the most cephalad incision.  There is a palpable left radial pulse. Neurologic: A&O X 3; left hand grip strength intact  CBC    Component Value Date/Time   WBC 7.6 09/24/2020 0429   RBC 3.18 (L) 09/24/2020 0429   HGB 9.5 (L) 09/24/2020 0429   HGB 11.0 (L) 06/19/2019 1010   HGB 8.4 (L) 10/04/2017 1534   HCT 29.4 (L) 09/24/2020 0429   HCT 26.1 (L) 10/04/2017 1534   PLT 142 (L) 09/24/2020 0429   PLT 192 06/19/2019 1010   PLT 201 10/04/2017 1534   MCV 92.5 09/24/2020 0429   MCV 87 10/04/2017 1534   MCH 29.9 09/24/2020 0429   MCHC 32.3 09/24/2020 0429   RDW 17.0 (H) 09/24/2020 0429   RDW 17.3 (H) 10/04/2017 1534   LYMPHSABS 1.2 09/23/2020 2329   LYMPHSABS 2.1 10/04/2017 1534   MONOABS 0.8 09/23/2020 2329   EOSABS 0.6 (H) 09/23/2020 2329   EOSABS 0.2 10/04/2017 1534   BASOSABS  0.0 09/23/2020 2329   BASOSABS 0.0 10/04/2017 1534    BMET    Component Value Date/Time   NA 132 (L) 09/23/2020 2329   NA 140 12/25/2018 1514   K 4.2 09/23/2020 2329   CL 91 (L) 09/23/2020 2329   CO2 27 09/23/2020 2329   GLUCOSE 158 (H) 09/23/2020 2329   BUN 25 (H) 09/23/2020 2329   BUN 33 (H) 12/25/2018 1514   CREATININE 5.48 (H) 09/23/2020 2329   CREATININE 2.24 (H) 02/04/2020 1416   CALCIUM  9.6 09/23/2020 2329   GFRNONAA 8 (L) 09/23/2020 2329   GFRNONAA 21 (L) 02/04/2020 1416   GFRAA 25 (L) 02/04/2020 1416    COAGS: Lab Results  Component Value Date   INR 1.1 (H) 09/14/2020   INR 2.1 (H) 05/27/2020   INR 1.3 (H) 12/05/2019     Non-Invasive Vascular Imaging:   No studies  ASSESSMENT/PLAN: This is a 75 y.o. female status postsecond stage left upper extremity basilic vein fistula.  She denies hematoma.  We will plan for hematoma drainage in the OR today.  COVID-19 test is negative.  Chesni Vos C. Donzetta Matters, MD [facility] Office: [phone] Pager: [phone]

## 2020-09-24 NOTE — Anesthesia Postprocedure Evaluation (Signed)
Anesthesia Post Note  Patient: Morgan Clay  Procedure(s) Performed: EVACUATION HEMATOMA ARM (Left )     Patient location during evaluation: PACU Anesthesia Type: Regional Level of consciousness: awake and alert, patient cooperative and oriented Pain management: pain level controlled Vital Signs Assessment: post-procedure vital signs reviewed and stable Respiratory status: spontaneous breathing, nonlabored ventilation and respiratory function stable Cardiovascular status: blood pressure returned to baseline and stable Postop Assessment: no apparent nausea or vomiting and epidural receding Anesthetic complications: no Comments: Pt concerned that her R lower pre-molar may be broken. Pt with many missing and poor teeth, her partials still fit well, tooth is not sharp, no soreness or gum injury. CRNA is not touch that area with intubation, although it is possible that she bit down on ETT during emergence and broke part of tooth. Pt counseled to F/U with dentist, and contact us if dentist notes acute injury     No complications documented.  Last Vitals:  Vitals:   09/24/20 1130 09/24/20 1145  BP: (!) 152/54 (!) 127/49  Pulse: 65 60  Resp: 17 (!) 6  Temp:    SpO2: 99% 100%    Last Pain:  Vitals:   09/24/20 1045  TempSrc:   PainSc: 8                  Carlina Derks,E. Laiken Nohr

## 2020-09-24 NOTE — Consult Note (Addendum)
[facility] Renal Consultation Note    Indication for Consultation:  Management of ESRD/hemodialysis; anemia, hypertension/volume and secondary hyperparathyroidism  ELF:YBOFB, Langley Adie, MD  HPI: Morgan Clay is a 75 y.o. female with dialysis dependent AKI since 07/06/2020 due to ABLA and cardiorenal syndrome. She is currently on HD MWF at [facility] with no significant change in renal function.  Past medical history significant for A fib, NICM combined diastolic/systolic HF s/p AICD, cirrhosis, HTN, recurrent GIB, DMT2 and hypothyroidism.   Patient presented to the ED last night due to bleeding from LU AVF at home.  Patient had left 2nd stage basilic vein transposition surgery on 09/16/20 by Dr. Stanford Breed.  This AM she underwent evacuation of left upper arm hematoma.    Seen and examined at bedside post surgery.  Reports moderate pain currently.  Denies CP, SOB, n/v/d, abdominal pain, edema, weakness, dizziness and fatigue.  Reports completed last dialysis on 09/22/20 using R IJ TDC. States her left arm has had progressively worsened pain and swelling over the last few days.  Yesterday it started bleeding at home so she decided to come in.  Pertinent labs include K 4.2, BUN 25, SCr 5.48, Ca 9.6 and Hgb 9.7.  Patient has been admitted for further evaluation and management.    Past Medical History:  Diagnosis Date  . A-fib ([place])    on Eliquis  . AICD (automatic cardioverter/defibrillator) present 10/06/2015  . Anemia   . Angiodysplasia of small intestine ([place]) 06/10/2020   Ileum - seen on capsule endoscopy 05/2020 - ablated at colonoscopy  . ARF (acute renal failure) ([place])   . Arthritis    "qwhere" (01/03/2018)  . Asthma    reports mild asthma since childhood - had COPD on dx list from prior PCP  . Chronic bronchitis ([place])    "get it most years; not this past year though" (01/03/2018)  . Chronic kidney disease    "? stage;  followed by [facility]; [name]" (01/03/2018)   . Chronic systolic congestive heart failure ([place])   . Cirrhosis ([place]) 09/14/2020   dx by Dr Silvano Rusk   . COVID-19 05/03/2020  . DEPRESSION 12/17/2009   Annotation: PHQ-9 score = 14 done on 12/17/2009 Qualifier: Diagnosis of  By: Hassell Done FNP, Tori Milks    . Diabetes mellitus without complication (HCC)    DIET CONTROLLED   . Diverticulosis   . Enteritis   . FIBROIDS, UTERUS 03/05/2008  . GI bleeding 01/03/2018  . Glaucoma   . Gout   . Hepatitis C    HEPATITIS C - s/p treatment with Harvoni, saw hepatology, Dawn Drazek  . History of blood transfusion ~ 11/2017  . Hyperlipemia 12/06/2012  . HYPERTENSION, BENIGN 04/24/2007  . Hypothyroidism   . LBBB (left bundle branch block)   . Lower GI bleeding    "been dealing w/it since 07/2017" (01/03/2018)  . Nonischemic cardiomyopathy ([place])   . OBESITY 05/27/2009  . OBSTRUCTIVE SLEEP APNEA 11/14/2007   no CPAP  . OSTEOPENIA 09/30/2008  . Pain and swelling of left upper extremity 08/08/2017  . Personal history of other infectious and parasitic disease    Hepatitis B  . Pneumonia    "several times" (01/03/2018)  . Pulmonary edema, acute ([place]) 08/09/2017   Past Surgical History:  Procedure Laterality Date  . APPENDECTOMY    . AV FISTULA PLACEMENT Left 08/05/2020   Procedure: LEFT UPPER EXTREMITY ARTERIOVENOUS (AV) FISTULA CREATION;  Surgeon: Cherre Robins, MD;  Location: [place];  Service:  Vascular;  Laterality: Left;  . BASCILIC VEIN TRANSPOSITION Left 09/16/2020   Procedure: LEFT SECOND STAGE BASCILIC VEIN TRANSPOSITION;  Surgeon: Cherre Robins, MD;  [facility]: [facility] OR;  Service: Vascular;  Laterality: Left;  PERIPHERAL NERVE BLOCK  . CARDIAC CATHETERIZATION    . CARDIOVERSION N/A 12/14/2014   Procedure: CARDIOVERSION;  Surgeon: Dorothy Spark, MD;  Location: [facility] ENDOSCOPY;  Service: Cardiovascular;  Laterality: N/A;  . CARDIOVERSION N/A 02/26/2017   Procedure: CARDIOVERSION;  Surgeon: Evans Lance, MD;  Location: [place] CV LAB;   Service: Cardiovascular;  Laterality: N/A;  . CARDIOVERSION N/A 07/24/2017   Procedure: CARDIOVERSION;  Surgeon: Thayer Headings, MD;  Location: [facility] ENDOSCOPY;  Service: Cardiovascular;  Laterality: N/A;  . CARDIOVERSION N/A 02/12/2020   Procedure: CARDIOVERSION;  Surgeon: [name], MD;  Location: [facility] ENDOSCOPY;  Service: Cardiovascular;  Laterality: N/A;  . COLONOSCOPY N/A 11/08/2017   Procedure: COLONOSCOPY;  Surgeon: Milus Banister, MD;  Location: [facility] ENDOSCOPY;  Service: Endoscopy;  Laterality: N/A;  . COLONOSCOPY WITH PROPOFOL N/A 05/31/2020   Procedure: COLONOSCOPY WITH PROPOFOL;  Surgeon: Irene Shipper, MD;  Location: [facility] ENDOSCOPY;  Service: Endoscopy;  Laterality: N/A;  . DILATION AND CURETTAGE OF UTERUS    . ENTEROSCOPY N/A 03/02/2020   Procedure: ENTEROSCOPY;  Surgeon: Yetta Flock, MD;  Location: [facility] ENDOSCOPY;  Service: Gastroenterology;  Laterality: N/A;  . EP IMPLANTABLE DEVICE N/A 10/06/2015   Procedure: BIV ICD Generator Changeout;  Surgeon: [name], MD;  Location: [place] CV LAB;  Service: Cardiovascular;  Laterality: N/A;  . ESOPHAGOGASTRODUODENOSCOPY N/A 10/26/2017   Procedure: ESOPHAGOGASTRODUODENOSCOPY (EGD);  Surgeon: Ladene Artist, MD;  Location: [facility] END[facility];  Service: Endoscopy;  Laterality: N/A;  . ESOPHAGOGASTRODUODENOSCOPY (EGD) WITH PROPOFOL N/A 11/07/2017   Procedure: ESOPHAGOGASTRODUODENOSCOPY (EGD) WITH PROPOFOL;  Surgeon: Milus Banister, MD;  Location: [facility] ENDOSCOPY;  Service: Endoscopy;  Laterality: N/A;  . ESOPHAGOGASTRODUODENOSCOPY (EGD) WITH PROPOFOL N/A 05/29/2020   Procedure: ESOPHAGOGASTRODUODENOSCOPY (EGD) WITH PROPOFOL;  Surgeon: Doran Stabler, MD;  Location: [place];  Service: Gastroenterology;  Laterality: N/A;  . GIVENS CAPSULE STUDY N/A 05/19/2020   Procedure: GIVENS CAPSULE STUDY;  Surgeon: [name], MD;  Location: [place];  Service: Endoscopy;  Laterality: N/A;  .adm for obs since pacemaker, PA wil enter  order and see pt  . GIVENS CAPSULE STUDY N/A 05/29/2020   Procedure: GIVENS CAPSULE STUDY;  Surgeon: Doran Stabler, MD;  Location: [place];  Service: Gastroenterology;  Laterality: N/A;  . HOT HEMOSTASIS N/A 10/26/2017   Procedure: HOT HEMOSTASIS (ARGON PLASMA COAGULATION/BICAP);  Surgeon: Ladene Artist, MD;  Location: [facility] ENDOSCOPY;  Service: Endoscopy;  Laterality: N/A;  . HOT HEMOSTASIS N/A 03/02/2020   Procedure: HOT HEMOSTASIS (ARGON PLASMA COAGULATION/BICAP);  Surgeon: Yetta Flock, MD;  Location: [facility] ENDOSCOPY;  Service: Gastroenterology;  Laterality: N/A;  . HOT HEMOSTASIS N/A 05/31/2020   Procedure: HOT HEMOSTASIS (ARGON PLASMA COAGULATION/BICAP);  Surgeon: Irene Shipper, MD;  Location: [facility] ENDOSCOPY;  Service: Endoscopy;  Laterality: N/A;  . INSERT / REPLACE / REMOVE PACEMAKER  ?2008  . IR PERC TUN PERIT CATH WO PORT S&I Dartha Lodge  07/05/2020  . POLYPECTOMY  11/08/2017   Procedure: POLYPECTOMY;  Surgeon: Milus Banister, MD;  Location: [facility] ENDOSCOPY;  Service: Endoscopy;;  . PROCTOSCOPY N/A 05/22/2018   Procedure: RIGID PROCTOSCOPY;  Surgeon: Michael Boston, MD;  [facility] ORS;  Service: General;  Laterality: N/A;  . RIGHT HEART CATH N/A 02/13/2018  Procedure: RIGHT HEART CATH;  Surgeon: [name], MD;  Location: [place] CV LAB;  Service: Cardiovascular;  Laterality: N/A;  . RIGHT HEART CATH N/A 06/23/2020   Procedure: RIGHT HEART CATH;  Surgeon: [name], MD;  Location: [place] CV LAB;  Service: Cardiovascular;  Laterality: N/A;  . RIGHT/LEFT HEART CATH AND CORONARY ANGIOGRAPHY N/A 12/11/2019   Procedure: RIGHT/LEFT HEART CATH AND CORONARY ANGIOGRAPHY;  Surgeon: [name], MD;  Location: [place] CV LAB;  Service: Cardiovascular;  Laterality: N/A;  . SUBMUCOSAL TATTOO INJECTION  03/02/2020   Procedure: SUBMUCOSAL TATTOO INJECTION;  Surgeon: Yetta Flock, MD;  Location: [facility] ENDOSCOPY;  Service: Gastroenterology;;  . Lia Foyer TATTOO  INJECTION  05/31/2020   Procedure: SUBMUCOSAL TATTOO INJECTION;  Surgeon: Irene Shipper, MD;  Location: [place];  Service: Endoscopy;;  . TUBAL LIGATION    . XI ROBOTIC ASSISTED LOWER ANTERIOR RESECTION N/A 05/22/2018   Procedure: XI ROBOTIC ASSISTED SIGMOID COLOECTOMY MOBILIZATION OF SPLENIC FLEXURE, FIREFLY ASSESSMENT OF PERFUSION;  Surgeon: Michael Boston, MD;  Location: [facility] ORS;  Service: General;  Laterality: N/A;  ERAS PATHWAY   Family History  Problem Relation Age of Onset  . Asthma Father   . Heart attack Father   . Asthma Sister   . Lung cancer Sister   . Heart attack Mother   . Stroke Brother   . Colon cancer Neg Hx   . Esophageal cancer Neg Hx   . Pancreatic cancer Neg Hx   . Stomach cancer Neg Hx   . Liver disease Neg Hx    Social History:  reports that she has never smoked. She has never used smokeless tobacco. She reports previous alcohol use. She reports previous drug use. Drug: Marijuana. No Known Allergies Prior to Admission medications   Medication Sig Start Date End Date Taking? Authorizing Provider  acetaminophen (TYLENOL) 650 MG CR tablet Take 650 mg by mouth every 8 (eight) hours as needed for pain.   Yes [provider]  amiodarone (PACERONE) 200 MG tablet Take 1 tablet (200 mg total) by mouth daily. 07/27/20  Yes Clegg, [name] D, NP  AURYXIA 1 GM 210 MG(Fe) tablet Take 420 mg by mouth 3 (three) times daily with meals. 08/31/20  Yes [provider]  carvedilol (COREG) 3.125 MG tablet Take 1 tablet (3.125 mg total) by mouth 2 (two) times daily with a meal. 07/12/20  Yes Lyda Jester M, PA-C  cromolyn (OPTICROM) 4 % ophthalmic solution Place 1 drop into both eyes 2 (two) times daily. 06/14/20  Yes [provider]  gabapentin (NEURONTIN) 300 MG capsule Take 1 capsule (300 mg total) by mouth daily. 07/12/20  Yes Simmons, Brittainy M, PA-C  isosorbide-hydrALAZINE (BIDIL) 20-37.5 MG tablet Take 0.5 tablets by mouth 3 (three) times daily. 07/12/20   Yes Lyda Jester M, PA-C  levothyroxine (SYNTHROID) 137 MCG tablet Take 1 tablet (137 mcg total) by mouth daily before breakfast. 03/11/20  Yes Billie Ruddy, MD  oxyCODONE-acetaminophen (PERCOCET) 10-325 MG tablet Take 1 tablet by mouth every 6 (six) hours as needed for pain. 09/16/20 09/16/21 Yes Setzer, Edman Circle, PA-C  traZODone (DESYREL) 50 MG tablet Take 50 mg by mouth at bedtime.   Yes [provider]  methocarbamol (ROBAXIN) 500 MG tablet Take 1 tablet (500 mg total) by mouth in the morning and at bedtime. Patient not taking: Reported on 09/24/2020 09/14/20   [name], MD   Current Facility-Administered Medications  Medication Dose Route Frequency Provider Last  Rate Last Admin  . amiodarone (PACERONE) tablet 200 mg  200 mg Oral Daily Setzer, Sandra J, PA-C      . carvedilol (COREG) tablet 3.125 mg  3.125 mg Oral BID WC Setzer, Edman Circle, PA-C      . ceFAZolin (ANCEF) 2-4 GM/100ML-% IVPB           . Chlorhexidine Gluconate Cloth 2 % PADS 6 each  6 each Topical Daily Elgergawy, Silver Huguenin, MD      . cromolyn (OPTICROM) 4 % ophthalmic solution 1 drop  1 drop Both Eyes BID Setzer, Sandra J, PA-C      . ferric citrate (AURYXIA) tablet 420 mg  420 mg Oral TID WC Setzer, Edman Circle, PA-C      . gabapentin (NEURONTIN) capsule 300 mg  300 mg Oral Daily Setzer, Sandra J, PA-C      . isosorbide-hydrALAZINE (BIDIL) 20-37.5 MG per tablet 0.5 tablet  0.5 tablet Oral TID Setzer, Edman Circle, PA-C      . levothyroxine (SYNTHROID) tablet 137 mcg  137 mcg Oral QAC breakfast Setzer, Edman Circle, PA-C      . morphine 2 MG/ML injection 1 mg  1 mg Intravenous Q3H PRN Barbie Banner, PA-C   1 mg at 09/24/20 0706  . nitroGLYCERIN (NITROSTAT) SL tablet 0.4 mg  0.4 mg Sublingual Q5 Min x 3 PRN Setzer, Edman Circle, PA-C      . oxyCODONE (Oxy IR/ROXICODONE) 5 MG immediate release tablet           . traZODone (DESYREL) tablet 50 mg  50 mg Oral QHS Barbie Banner, PA-C       Labs: Basic Metabolic  Panel: Recent Labs  Lab 09/23/20 2329  NA 132*  K 4.2  CL 91*  CO2 27  GLUCOSE 158*  BUN 25*  CREATININE 5.48*  CALCIUM 9.6   CBC: Recent Labs  Lab 09/23/20 2329 09/24/20 0429 09/24/20 1233  WBC 6.3 7.6 6.9  NEUTROABS 3.7  --   --   HGB 9.7* 9.5* 9.7*  HCT 30.1* 29.4* 30.3*  MCV 92.3 92.5 92.9  PLT 149* 142* 129*   CBG: Recent Labs  Lab 09/24/20 0642 09/24/20 1030 09/24/20 1225  GLUCAP 82 97 88   ROS: All others negative except those listed in HPI.   Physical Exam: Vitals:   09/24/20 1115 09/24/20 1130 09/24/20 1145 09/24/20 1230  BP: (!) 142/64 (!) 152/54 (!) 127/49 131/60  Pulse: 63 65 60 64  Resp: 11 17 (!) 6 17  Temp:    97.8 F (36.6 C)  TempSrc:    Oral  SpO2: (!) 86% 99% 100% 100%  Weight:      Height:         General: Well appearing female in NAD Head: NCAT sclera not icteric MMM Neck: Supple. No lymphadenopathy Lungs: CTA bilaterally. No wheeze, rales or rhonchi. Breathing is unlabored. Heart: RRR. No murmur, rubs or gallops.  Abdomen: soft, nontender, +BS, no guarding, no rebound tenderness Extremities:no LE edema, ischemic changes, or open wounds. L arm wrapped in ace bandage. Neuro: AAOx3. Moves all extremities spontaneously. Psych:  Responds to questions appropriately with a normal affect. Dialysis Access: LU AVF wrapped +b, R IJ TDC  Dialysis Orders:  MWF - [facility]  3hrs 76min, BFR 400, DFR 600,  EDW59, 2K/ 2Ca  Access: TDC, LU AVF   Heparin none Hectorol 60mcg IV qHD   Assessment/Plan: 1.  L arm hematoma s/p 2nd stage BVT fistula - s/p evacuation  today by Dr. Donnetta Hutching.   2.  Dialysis dependent AKI -  On HD MWF.  K 4.2, BUN 25, SCr 5.48. No signs of respiratory distress or volume overload.  Due to increased patient census on MWF schedule today plan for HD tomorrow 1st shift.  Can resume regular schedule on 09/27/20.  3.  Hypertension/volume  - BP mostly well controlled. Continue home meds.  Does not appear volume overloaded.  Plan for  UF to dry. 4.  Anemia of CKD - Hgb 9.7.  Tsat from 5/18 30%.  Will order low dose ESA with HD tomorrow.  5.  Secondary Hyperparathyroidism -  Ca in goal. Will check phos. Continue binders, sensipar and VDRA.  6.  Nutrition - Renal diet w/fluid restrictions 7. Combined diastolic/systolic HF - appears euvolemic 8. A fib - per primary 9. DMT2 - per primary  Jen Mow, PA-C [facility] 09/24/2020, 1:08 PM

## 2020-09-24 NOTE — Progress Notes (Signed)
Spoke with Renae Fickle, [employer] rep, who stated that it was appropriate to place magnet over device during surgery.

## 2020-09-24 NOTE — OR Nursing (Signed)
Left arm fistula continues with good thrill post procedure.

## 2020-09-24 NOTE — ED Notes (Signed)
[place].  Please reach out to friend Peter Congo in contacts with questions or concerns/updates.

## 2020-09-24 NOTE — Transfer of Care (Signed)
Immediate Anesthesia Transfer of Care Note  Patient: Morgan Clay  Procedure(s) Performed: EVACUATION HEMATOMA ARM (Left )  Patient Location: PACU  Anesthesia Type:General  Level of Consciousness: patient cooperative and responds to stimulation  Airway & Oxygen Therapy: Patient Spontanous Breathing and Patient connected to face mask oxygen  Post-op Assessment: Report given to RN and Post -op Vital signs reviewed and stable  Post vital signs: Reviewed and stable  Last Vitals:  Vitals Value Taken Time  BP 168/60 09/24/20 1031  Temp    Pulse    Resp 13 09/24/20 1032  SpO2    Vitals shown include unvalidated device data.  Last Pain:  Vitals:   09/24/20 0824  TempSrc: Oral  PainSc:          Complications: No complications documented.

## 2020-09-24 NOTE — Progress Notes (Signed)
PROGRESS NOTE    Morgan Clay  YIF:[id number] DOB: September 04, 1945 DOA: 09/23/2020 PCP: Billie Ruddy, MD    Chief Complaint  Patient presents with  . Vascular Access Problem    Brief Narrative:                                     Is a no charge note as patient was seen and admitted earlier today by Dr. Hal Hope, chart, imaging and labs were reviewed.  Patient was seen and examined   HPI: Morgan Clay is a 75 y.o. female with history of ESRD on hemodialysis Monday Wednesday Friday had procedure for her left AV fistula last week following which patient states she had mild oozing of blood but since last evening it became more pronounced.  Patient presents to the ER.  Patient's last dialysis was on Wednesday 2 days ago.  Denies any fever chills chest pain or shortness of breath.  ED Course: In the ER patient has swelling of the left AV fistula area.  Patient with pulses are palpable.  No restriction of the movements of the extremities.  ER physician discussed with on-call vascular surgeon Dr. Donzetta Matters will be seeing patient in consult.  Patient admitted for further observation.  Labs show drop in hemoglobin from 12.9 about a week avoids around 9.7.  Platelets of 149.  COVID test is negative. Assessment & Plan:   Principal Problem:   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) Active Problems:   Hypothyroidism   Nonischemic cardiomyopathy (HCC)   Type 2 diabetes, controlled, with renal manifestation (HCC)   Chronic obstructive pulmonary disease (HCC)   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 (HCV) (HCC)   Acute blood loss anemia   Bleeding pseudoaneurysm of left brachiocephalic AV fistula ([place])    1. Bleeding left AV fistula -s/p evacuation today by Dr. Donnetta Hutching 2. Hypertension on BiDil and Coreg. 3. History of nonischemic cardiomyopathy fluid managed per dialysis. 4. ESRD on hemodialysis on Monday Wednesday Friday nephrology  consulted 5. COPD not actively wheezing. 6. History of A. fib not on anticoagulation secondary to recurrent GI bleed.  On Coreg and amiodarone. 7. Hypothyroidism on Synthroid. 8. Acute blood loss anemia -anemia is further worsening from baseline.  Likely from blood loss.  Follow CBC. 9. Thrombocytopenia appears to be new could be from cirrhosis. 10. History of cirrhosis of the liver. 11. Diabetes mellitus is mentioned in the chart patient is not on any medication.  Follow CBGs.   DVT prophylaxis: No chemical prophylaxis given bleeding at left AV fistula site Code Status: Full Family Communication: None at bedside Disposition:   Status is: Inpatient  Remains inpatient appropriate because:Ongoing diagnostic testing needed not appropriate for outpatient work up and Unsafe d/c plan   Dispo: The patient is from: Home              Anticipated d/c is to: Home              Patient currently is not medically stable to d/c.   Difficult to place patient No       Consultants:   Renal Vascular surgery   Subjective:   She denies any complaints, reports pain is controlled  Objective: Vitals:   09/24/20 1115 09/24/20 1130 09/24/20 1145 09/24/20 1230  BP: (!) 142/64 (!) 152/54 (!) 127/49 131/60  Pulse: 63 65 60 64  Resp: 11 17 (!) 6 17  Temp:    97.8 F (36.6 C)  TempSrc:    Oral  SpO2: (!) 86% 99% 100% 100%  Weight:      Height:        Intake/Output Summary (Last 24 hours) at 09/24/2020 1533 Last data filed at 09/24/2020 1200 Gross per 24 hour  Intake 200 ml  Output --  Net 200 ml   Filed Weights   09/24/20 0824  Weight: 59.4 kg    Examination:  General exam: Appears calm and comfortable  Respiratory system: Clear to auscultation. Respiratory effort normal. Cardiovascular system: S1 & S2 heard, RRR. No JVD, murmurs, rubs, gallops or clicks. No pedal edema. Gastrointestinal system: Abdomen is nondistended, soft and nontender. No organomegaly or masses felt. Normal  bowel sounds heard. Central nervous system: Alert and oriented. No focal neurological deficits. Extremities: Symmetric 5 x 5 power.  Distal extremity with Ace wrap Skin: No rashes, lesions or ulcers Psychiatry: Judgement and insight appear normal. Mood & affect appropriate.     Data Reviewed: I have personally reviewed following labs and imaging studies  CBC: Recent Labs  Lab 09/23/20 2329 09/24/20 0429 09/24/20 1233  WBC 6.3 7.6 6.9  NEUTROABS 3.7  --   --   HGB 9.7* 9.5* 9.7*  HCT 30.1* 29.4* 30.3*  MCV 92.3 92.5 92.9  PLT 149* 142* 129*    Basic Metabolic Panel: Recent Labs  Lab 09/23/20 2329  NA 132*  K 4.2  CL 91*  CO2 27  GLUCOSE 158*  BUN 25*  CREATININE 5.48*  CALCIUM 9.6    GFR: Estimated Creatinine Clearance: 7.3 mL/min (A) (by C-G formula based on SCr of 5.48 mg/dL (H)).  Liver Function Tests: No results for input(s): AST, ALT, ALKPHOS, BILITOT, PROT, ALBUMIN in the last 168 hours.  CBG: Recent Labs  Lab 09/24/20 0642 09/24/20 1030 09/24/20 1225  GLUCAP 82 97 88     Recent Results (from the past 240 hour(s))  Resp Panel by RT-PCR (Flu A&B, Covid) Nasopharyngeal Swab     Status: None   Collection Time: 09/23/20 11:52 PM   Specimen: Nasopharyngeal Swab; Nasopharyngeal(NP) swabs in vial transport medium  Result Value Ref Range Status   SARS Coronavirus 2 by RT PCR NEGATIVE NEGATIVE Final    Comment: (NOTE) SARS-CoV-2 target nucleic acids are NOT DETECTED.  The SARS-CoV-2 RNA is generally detectable in upper respiratory specimens during the acute phase of infection. The lowest concentration of SARS-CoV-2 viral copies this assay can detect is 138 copies/mL. A negative result does not preclude SARS-Cov-2 infection and should not be used as the sole basis for treatment or other patient management decisions. A negative result may occur with  improper specimen collection/handling, submission of specimen other than nasopharyngeal swab, presence  of viral mutation(s) within the areas targeted by this assay, and inadequate number of viral copies(<138 copies/mL). A negative result must be combined with clinical observations, patient history, and epidemiological information. The expected result is Negative.  Fact Sheet for Patients:  EntrepreneurPulse.com.au  Fact Sheet for Healthcare Providers:  IncredibleEmployment.be  This test is no t yet approved or cleared by the [place] FDA and  has been authorized for detection and/or diagnosis of SARS-CoV-2 by FDA under an Emergency Use Authorization (EUA). This EUA will remain  in effect (meaning this test can be used) for the duration of the COVID-19 declaration under Section 564(b)(1) of the Act, 21 U.S.C.section 360bbb-3(b)(1), unless the authorization is terminated  or revoked sooner.  Influenza A by PCR NEGATIVE NEGATIVE Final   Influenza B by PCR NEGATIVE NEGATIVE Final    Comment: (NOTE) The Xpert Xpress SARS-CoV-2/FLU/RSV plus assay is intended as an aid in the diagnosis of influenza from Nasopharyngeal swab specimens and should not be used as a sole basis for treatment. Nasal washings and aspirates are unacceptable for Xpert Xpress SARS-CoV-2/FLU/RSV testing.  Fact Sheet for Patients: EntrepreneurPulse.com.au  Fact Sheet for Healthcare Providers: IncredibleEmployment.be  This test is not yet approved or cleared by the [place] FDA and has been authorized for detection and/or diagnosis of SARS-CoV-2 by FDA under an Emergency Use Authorization (EUA). This EUA will remain in effect (meaning this test can be used) for the duration of the COVID-19 declaration under Section 564(b)(1) of the Act, 21 U.S.C. section 360bbb-3(b)(1), unless the authorization is terminated or revoked.  Performed at [facility], [place] [address], [place], [place] [address]          Radiology  Studies: No results found.      Scheduled Meds: . amiodarone  200 mg Oral Daily  . carvedilol  3.125 mg Oral BID WC  . Chlorhexidine Gluconate Cloth  6 each Topical Daily  . cromolyn  1 drop Both Eyes BID  . ferric citrate  420 mg Oral TID WC  . gabapentin  300 mg Oral Daily  . isosorbide-hydrALAZINE  0.5 tablet Oral TID  . levothyroxine  137 mcg Oral QAC breakfast  . oxyCODONE      . traZODone  50 mg Oral QHS   Continuous Infusions: . ceFAZolin       LOS: 0 days      Phillips Climes, MD [facility]   To contact the attending provider between 7A-7P or the covering provider during after hours 7P-7A, please log into the web site [email or web address] and access using universal [place] password for that web site. If you do not have the password, please call the hospital operator.  09/24/2020, 3:33 PM

## 2020-09-24 NOTE — Discharge Instructions (Signed)
Incision and Drainage, Care After This sheet gives you information about how to care for yourself after your procedure. Your health care provider may also give you more specific instructions. If you have problems or questions, contact your health care provider. What can I expect after the procedure? After the procedure, it is common to have:  Pain or discomfort around the incision site.  Blood, fluid, or pus (drainage) from the incision.  Redness and firm skin around the incision site. Follow these instructions at home: Medicines  Take over-the-counter and prescription medicines only as told by your health care provider.  If you were prescribed an antibiotic medicine, use or take it as told by your health care provider. Do not stop using the antibiotic even if you start to feel better. Wound care Follow instructions from your health care provider about how to take care of your wound. Make sure you:  Wash your hands with soap and water before and after you change your bandage (dressing). If soap and water are not available, use hand sanitizer.  Change your dressing and packing as told by your health care provider. ? If your dressing is dry or stuck when you try to remove it, moisten or wet the dressing with saline or water so that it can be removed without harming your skin or tissues. ? If your wound is packed, leave it in place until your health care provider tells you to remove it. To remove the packing, moisten or wet the packing with saline or water so that it can be removed without harming your skin or tissues.  Leave stitches (sutures), skin glue, or adhesive strips in place. These skin closures may need to stay in place for 2 weeks or longer. If adhesive strip edges start to loosen and curl up, you may trim the loose edges. Do not remove adhesive strips completely unless your health care provider tells you to do that. Check your wound every day for signs of infection. Check  for:  More redness, swelling, or pain.  More fluid or blood.  Warmth.  Pus or a bad smell. If you were sent home with a drain tube in place, follow instructions from your health care provider about:  How to empty it.  How to care for it at home.   General instructions  Rest the affected area.  Do not take baths, swim, or use a hot tub until your health care provider approves. Ask your health care provider if you may take showers. You may only be allowed to take sponge baths.  Return to your normal activities as told by your health care provider. Ask your health care provider what activities are safe for you. Your health care provider may put you on activity or lifting restrictions.  The incision will continue to drain. It is normal to have some clear or slightly bloody drainage. The amount of drainage should lessen each day.  Do not apply any creams, ointments, or liquids unless you have been told to by your health care provider.  Keep all follow-up visits as told by your health care provider. This is important. Contact a health care provider if:  Your cyst or abscess returns.  You have a fever or chills.  You have more redness, swelling, or pain around your incision.  You have more fluid or blood coming from your incision.  Your incision feels warm to the touch.  You have pus or a bad smell coming from your incision.  You have red   streaks above or below the incision site. Get help right away if:  You have severe pain or bleeding.  You cannot eat or drink without vomiting.  You have decreased urine output.  You become short of breath.  You have chest pain.  You cough up blood.  The affected area becomes numb or starts to tingle. These symptoms may represent a serious problem that is an emergency. Do not wait to see if the symptoms will go away. Get medical help right away. Call your local emergency services (911 in the [place] Do not drive yourself to the  hospital. Summary  After this procedure, it is common to have fluid, blood, or pus coming from the surgery site.  Follow all home care instructions. You will be told how to take care of your incision, how to check for infection, and how to take medicines.  If you were prescribed an antibiotic medicine, take it as told by your health care provider. Do not stop taking the antibiotic even if you start to feel better.  Contact a health care provider if you have increased redness, swelling, or pain around your incision. Get help right away if you have chest pain, you vomit, you cough up blood, or you have shortness of breath.  Keep all follow-up visits as told by your health care provider. This is important. This information is not intended to replace advice given to you by your health care provider. Make sure you discuss any questions you have with your health care provider. Document Revised: 03/25/2018 Document Reviewed: 03/25/2018 Elsevier Patient Education  2021 Elsevier Inc.  

## 2020-09-24 NOTE — Consult Note (Signed)
Hospital Consult    Reason for Consult:  Left arm hematoma  Referring Physician:  Dr. Ronnald Nian MRN #:  [id number]  History of Present Illness: This is a 75 y.o. female status post left upper arm 6 days basilic vein fistula.  She says she initially did well.  She is currently dialyzing via right IJ catheter.  She subsequently had drainage from the upper incision of her left arm.  She also has some numbness extending down to her hand with swelling of her upper arm.  She came to the hospital yesterday when the swelling was increasing.  She denies fevers or chills.  She does not take blood thinners.  Past Medical History:  Diagnosis Date  . A-fib ([place])    on Eliquis  . AICD (automatic cardioverter/defibrillator) present 10/06/2015  . Anemia   . Angiodysplasia of small intestine ([place]) 06/10/2020   Ileum - seen on capsule endoscopy 05/2020 - ablated at colonoscopy  . ARF (acute renal failure) ([place])   . Arthritis    "qwhere" (01/03/2018)  . Asthma    reports mild asthma since childhood - had COPD on dx list from prior PCP  . Chronic bronchitis ([place])    "get it most years; not this past year though" (01/03/2018)  . Chronic kidney disease    "? stage;  followed by [facility]; [name]" (01/03/2018)  . Chronic systolic congestive heart failure ([place])   . Cirrhosis ([place]) 09/14/2020   dx by Dr Silvano Rusk   . COVID-19 05/03/2020  . DEPRESSION 12/17/2009   Annotation: PHQ-9 score = 14 done on 12/17/2009 Qualifier: Diagnosis of  By: Hassell Done FNP, Tori Milks    . Diabetes mellitus without complication (HCC)    DIET CONTROLLED   . Diverticulosis   . Enteritis   . FIBROIDS, UTERUS 03/05/2008  . GI bleeding 01/03/2018  . Glaucoma   . Gout   . Hepatitis C    HEPATITIS C - s/p treatment with Harvoni, saw hepatology, Dawn Drazek  . History of blood transfusion ~ 11/2017  . Hyperlipemia 12/06/2012  . HYPERTENSION, BENIGN 04/24/2007  . Hypothyroidism   . LBBB (left bundle branch block)   . Lower GI  bleeding    "been dealing w/it since 07/2017" (01/03/2018)  . Nonischemic cardiomyopathy ([place])   . OBESITY 05/27/2009  . OBSTRUCTIVE SLEEP APNEA 11/14/2007   no CPAP  . OSTEOPENIA 09/30/2008  . Pain and swelling of left upper extremity 08/08/2017  . Personal history of other infectious and parasitic disease    Hepatitis B  . Pneumonia    "several times" (01/03/2018)  . Pulmonary edema, acute ([place]) 08/09/2017    Past Surgical History:  Procedure Laterality Date  . APPENDECTOMY    . AV FISTULA PLACEMENT Left 08/05/2020   Procedure: LEFT UPPER EXTREMITY ARTERIOVENOUS (AV) FISTULA CREATION;  Surgeon: Cherre Robins, MD;  Location: [place];  Service: Vascular;  Laterality: Left;  . BASCILIC VEIN TRANSPOSITION Left 09/16/2020   Procedure: LEFT SECOND STAGE BASCILIC VEIN TRANSPOSITION;  Surgeon: Cherre Robins, MD;  Location: [facility] OR;  Service: Vascular;  Laterality: Left;  PERIPHERAL NERVE BLOCK  . CARDIAC CATHETERIZATION    . CARDIOVERSION N/A 12/14/2014   Procedure: CARDIOVERSION;  Surgeon: Dorothy Spark, MD;  Location: [facility] ENDOSCOPY;  Service: Cardiovascular;  Laterality: N/A;  . CARDIOVERSION N/A 02/26/2017   Procedure: CARDIOVERSION;  Surgeon: Evans Lance, MD;  Location: [place] CV LAB;  Service: Cardiovascular;  Laterality: N/A;  . CARDIOVERSION N/A 07/24/2017   Procedure:  CARDIOVERSION;  Surgeon: Acie Fredrickson Wonda Cheng, MD;  Location: [facility] ENDOSCOPY;  Service: Cardiovascular;  Laterality: N/A;  . CARDIOVERSION N/A 02/12/2020   Procedure: CARDIOVERSION;  Surgeon: [name], MD;  Location: [facility] ENDOSCOPY;  Service: Cardiovascular;  Laterality: N/A;  . COLONOSCOPY N/A 11/08/2017   Procedure: COLONOSCOPY;  Surgeon: Milus Banister, MD;  Location: [facility] ENDOSCOPY;  Service: Endoscopy;  Laterality: N/A;  . COLONOSCOPY WITH PROPOFOL N/A 05/31/2020   Procedure: COLONOSCOPY WITH PROPOFOL;  Surgeon: Irene Shipper, MD;  Location: [facility] ENDOSCOPY;  Service: Endoscopy;  Laterality: N/A;  . DILATION AND  CURETTAGE OF UTERUS    . ENTEROSCOPY N/A 03/02/2020   Procedure: ENTEROSCOPY;  Surgeon: Yetta Flock, MD;  Location: [facility] ENDOSCOPY;  Service: Gastroenterology;  Laterality: N/A;  . EP IMPLANTABLE DEVICE N/A 10/06/2015   Procedure: BIV ICD Generator Changeout;  Surgeon: [name], MD;  Location: [place] CV LAB;  Service: Cardiovascular;  Laterality: N/A;  . ESOPHAGOGASTRODUODENOSCOPY N/A 10/26/2017   Procedure: ESOPHAGOGASTRODUODENOSCOPY (EGD);  Surgeon: Ladene Artist, MD;  Location: [facility];  Service: Endoscopy;  Laterality: N/A;  . ESOPHAGOGASTRODUODENOSCOPY (EGD) WITH PROPOFOL N/A 11/07/2017   Procedure: ESOPHAGOGASTRODUODENOSCOPY (EGD) WITH PROPOFOL;  Surgeon: Milus Banister, MD;  Location: [facility] ENDOSCOPY;  Service: Endoscopy;  Laterality: N/A;  . ESOPHAGOGASTRODUODENOSCOPY (EGD) WITH PROPOFOL N/A 05/29/2020   Procedure: ESOPHAGOGASTRODUODENOSCOPY (EGD) WITH PROPOFOL;  Surgeon: Doran Stabler, MD;  Location: [place];  Service: Gastroenterology;  Laterality: N/A;  . GIVENS CAPSULE STUDY N/A 05/19/2020   Procedure: GIVENS CAPSULE STUDY;  Surgeon: [name], MD;  Location: [place];  Service: Endoscopy;  Laterality: N/A;  .adm for obs since pacemaker, PA wil enter order and see pt  . GIVENS CAPSULE STUDY N/A 05/29/2020   Procedure: GIVENS CAPSULE STUDY;  Surgeon: Doran Stabler, MD;  Location: [place];  Service: Gastroenterology;  Laterality: N/A;  . HOT HEMOSTASIS N/A 10/26/2017   Procedure: HOT HEMOSTASIS (ARGON PLASMA COAGULATION/BICAP);  Surgeon: Ladene Artist, MD;  Location: [facility];  Service: Endoscopy;  Laterality: N/A;  . HOT HEMOSTASIS N/A 03/02/2020   Procedure: HOT HEMOSTASIS (ARGON PLASMA COAGULATION/BICAP);  Surgeon: Yetta Flock, MD;  Location: [facility] ENDOSCOPY;  Service: Gastroenterology;  Laterality: N/A;  . HOT HEMOSTASIS N/A 05/31/2020   Procedure: HOT HEMOSTASIS (ARGON PLASMA COAGULATION/BICAP);  Surgeon: Irene Shipper, MD;   Location: [facility] ENDOSCOPY;  Service: Endoscopy;  Laterality: N/A;  . INSERT / REPLACE / REMOVE PACEMAKER  ?2008  . IR PERC TUN PERIT CATH WO PORT S&I Dartha Lodge  07/05/2020  . POLYPECTOMY  11/08/2017   Procedure: POLYPECTOMY;  Surgeon: Milus Banister, MD;  Location: [facility] END[facility];  Service: Endoscopy;;  . PROCTOSCOPY N/A 05/22/2018   Procedure: RIGID PROCTOSCOPY;  Surgeon: Michael Boston, MD;  Location: [facility] ORS;  Service: General;  Laterality: N/A;  . RIGHT HEART CATH N/A 02/13/2018   Procedure: RIGHT HEART CATH;  Surgeon: [name], MD;  Location: [place] CV LAB;  Service: Cardiovascular;  Laterality: N/A;  . RIGHT HEART CATH N/A 06/23/2020   Procedure: RIGHT HEART CATH;  Surgeon: [name], MD;  Location: [place] CV LAB;  Service: Cardiovascular;  Laterality: N/A;  . RIGHT/LEFT HEART CATH AND CORONARY ANGIOGRAPHY N/A 12/11/2019   Procedure: RIGHT/LEFT HEART CATH AND CORONARY ANGIOGRAPHY;  Surgeon: [name], MD;  Location: [place] CV LAB;  Service: Cardiovascular;  Laterality: N/A;  . SUBMUCOSAL TATTOO INJECTION  03/02/2020   Procedure: SUBMUCOSAL TATTOO INJECTION;  Surgeon: Havery Moros,  Carlota Raspberry, MD;  Location: [facility] ENDOSCOPY;  Service: Gastroenterology;;  . Lia Foyer TATTOO INJECTION  05/31/2020   Procedure: SUBMUCOSAL TATTOO INJECTION;  Surgeon: Irene Shipper, MD;  Location: [place];  Service: Endoscopy;;  . TUBAL LIGATION    . XI ROBOTIC ASSISTED LOWER ANTERIOR RESECTION N/A 05/22/2018   Procedure: XI ROBOTIC ASSISTED SIGMOID COLOECTOMY MOBILIZATION OF SPLENIC FLEXURE, FIREFLY ASSESSMENT OF PERFUSION;  Surgeon: Michael Boston, MD;  Location: [facility] ORS;  Service: General;  Laterality: N/A;  ERAS PATHWAY    No Known Allergies  Prior to Admission medications   Medication Sig Start Date End Date Taking? Authorizing Provider  acetaminophen (TYLENOL) 650 MG CR tablet Take 650 mg by mouth every 8 (eight) hours as needed for pain.    [provider]  amiodarone  (PACERONE) 200 MG tablet Take 1 tablet (200 mg total) by mouth daily. 07/27/20   Clegg, [name] D, NP  AURYXIA 1 GM 210 MG(Fe) tablet Take 420 mg by mouth 3 (three) times daily with meals. 08/31/20   [provider]  carvedilol (COREG) 3.125 MG tablet Take 1 tablet (3.125 mg total) by mouth 2 (two) times daily with a meal. 07/12/20   Lyda Jester M, PA-C  cromolyn (OPTICROM) 4 % ophthalmic solution Place 1 drop into both eyes 2 (two) times daily. 06/14/20   [provider]  gabapentin (NEURONTIN) 300 MG capsule Take 1 capsule (300 mg total) by mouth daily. 07/12/20   Lyda Jester M, PA-C  isosorbide-hydrALAZINE (BIDIL) 20-37.5 MG tablet Take 0.5 tablets by mouth 3 (three) times daily. 07/12/20   Consuelo Pandy, PA-C  levothyroxine (SYNTHROID) 137 MCG tablet Take 1 tablet (137 mcg total) by mouth daily before breakfast. 03/11/20   Billie Ruddy, MD  methocarbamol (ROBAXIN) 500 MG tablet Take 1 tablet (500 mg total) by mouth in the morning and at bedtime. 09/14/20   [name], MD  nitroGLYCERIN (NITROSTAT) 0.4 MG SL tablet Place 0.4 mg under the tongue every 5 (five) minutes x 3 doses as needed for chest pain.    [provider]  oxyCODONE-acetaminophen (PERCOCET) 10-325 MG tablet Take 1 tablet by mouth every 6 (six) hours as needed for pain. 09/16/20 09/16/21  Setzer, Ed[address], PA-C  traZODone (DESYREL) 50 MG tablet Take 50 mg by mouth at bedtime.    [provider]    Social History   Socioeconomic History  . Marital status: Divorced    Spouse name: Not on file  . Number of children: 1  . Years of education: 82  . Highest education level: Not on file  Occupational History  . Not on file  Tobacco Use  . Smoking status: Never Smoker  . Smokeless tobacco: Never Used  Vaping Use  . Vaping Use: Never used  Substance and Sexual Activity  . Alcohol use: Not Currently    Comment: 01/03/2018 "couple drinks/month"  . Drug use: Not Currently    Types:  Marijuana    Comment: VERY INTERMITTENT   . Sexual activity: Not Currently  Other Topics Concern  . Not on file  Social History Narrative   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      Home Situation: lives alone      Spiritual Beliefs: Christian - [employer]      Lifestyle: no regular exercise; poor diet      She is divorced at least one adult child   Never smoker    rare alcohol to occasional at best  no substance abuse         Social Determinants of Health   Financial Resource Strain: Low Risk   . Difficulty of Paying Living Expenses: Not hard at all  Food Insecurity: No Food Insecurity  . Worried About Charity fundraiser in the Last Year: Never true  . Ran Out of Food in the Last Year: Never true  Transportation Needs: No Transportation Needs  . Lack of Transportation (Medical): No  . Lack of Transportation (Non-Medical): No  Physical Activity: Not on file  Stress: Not on file  Social Connections: Not on file  Intimate Partner Violence: Not on file     Family History  Problem Relation Age of Onset  . Asthma Father   . Heart attack Father   . Asthma Sister   . Lung cancer Sister   . Heart attack Mother   . Stroke Brother   . Colon cancer Neg Hx   . Esophageal cancer Neg Hx   . Pancreatic cancer Neg Hx   . Stomach cancer Neg Hx   . Liver disease Neg Hx     ROS:  Cardiovascular: []  chest pain/pressure []  palpitations []  SOB lying flat []  DOE []  pain in legs while walking []  pain in legs at rest []  pain in legs at night []  non-healing ulcers []  hx of DVT []  swelling in legs  Pulmonary: []  productive cough []  asthma/wheezing []  home O2  Neurologic: []  weakness in []  arms []  legs []  numbness in []  arms []  legs []  hx of CVA []  mini stroke [] difficulty speaking or slurred speech []  temporary loss of vision in one eye []  dizziness  Hematologic: []  hx of cancer []  bleeding problems []  problems with blood clotting easily  Endocrine:   []   diabetes []  thyroid disease  GI []  vomiting blood []  blood in stool  GU: []  CKD/renal failure []  HD--[]  M/W/F or []  T/T/S []  burning with urination []  blood in urine  Psychiatric: []  anxiety []  depression  Musculoskeletal: []  arthritis [x]  arm swelling  Integumentary: []  rashes []  ulcers  Constitutional: []  fever []  chills   Physical Examination  Vitals:   09/24/20 0615 09/24/20 0630  BP: 137/69 (!) 144/57  Pulse: 68 69  Resp: 10 13  Temp:    SpO2: 100% 100%   There is no height or weight on file to calculate BMI.  General:  nad HENT: WNL, normocephalic Pulmonary: normal non-labored breathing= Abdomen: soft, NT/ND, no masses Extremities: Left upper arm there is drainage from the most cephalad incision.  There is a palpable left radial pulse. Neurologic: A&O X 3; left hand grip strength intact  CBC    Component Value Date/Time   WBC 7.6 09/24/2020 0429   RBC 3.18 (L) 09/24/2020 0429   HGB 9.5 (L) 09/24/2020 0429   HGB 11.0 (L) 06/19/2019 1010   HGB 8.4 (L) 10/04/2017 1534   HCT 29.4 (L) 09/24/2020 0429   HCT 26.1 (L) 10/04/2017 1534   PLT 142 (L) 09/24/2020 0429   PLT 192 06/19/2019 1010   PLT 201 10/04/2017 1534   MCV 92.5 09/24/2020 0429   MCV 87 10/04/2017 1534   MCH 29.9 09/24/2020 0429   MCHC 32.3 09/24/2020 0429   RDW 17.0 (H) 09/24/2020 0429   RDW 17.3 (H) 10/04/2017 1534   LYMPHSABS 1.2 09/23/2020 2329   LYMPHSABS 2.1 10/04/2017 1534   MONOABS 0.8 09/23/2020 2329   EOSABS 0.6 (H) 09/23/2020 2329   EOSABS 0.2 10/04/2017 1534   BASOSABS  0.0 09/23/2020 2329   BASOSABS 0.0 10/04/2017 1534    BMET    Component Value Date/Time   NA 132 (L) 09/23/2020 2329   NA 140 12/25/2018 1514   K 4.2 09/23/2020 2329   CL 91 (L) 09/23/2020 2329   CO2 27 09/23/2020 2329   GLUCOSE 158 (H) 09/23/2020 2329   BUN 25 (H) 09/23/2020 2329   BUN 33 (H) 12/25/2018 1514   CREATININE 5.48 (H) 09/23/2020 2329   CREATININE 2.24 (H) 02/04/2020 1416   CALCIUM  9.6 09/23/2020 2329   GFRNONAA 8 (L) 09/23/2020 2329   GFRNONAA 21 (L) 02/04/2020 1416   GFRAA 25 (L) 02/04/2020 1416    COAGS: Lab Results  Component Value Date   INR 1.1 (H) 09/14/2020   INR 2.1 (H) 05/27/2020   INR 1.3 (H) 12/05/2019     Non-Invasive Vascular Imaging:   No studies  ASSESSMENT/PLAN: This is a 75 y.o. female status postsecond stage left upper extremity basilic vein fistula.  She denies hematoma.  We will plan for hematoma drainage in the OR today.  COVID-19 test is negative.  Mylinh Cragg C. Donzetta Matters, MD [facility] Office: [phone] Pager: [phone]

## 2020-09-24 NOTE — ED Notes (Signed)
Small amount of blood present on left arm gauze bandage. Soiled bandage removed, small trickle of blood present at incision. New gauze wrap applied, bleeding controlled.

## 2020-09-24 NOTE — H&P (Signed)
History and Physical    Towanna Avery LNL:[id number] DOB: 03-Sep-1945 DOA: 09/23/2020  PCP: Billie Ruddy, MD  Patient coming from: Home.  Chief Complaint: Bleeding from left AV fistula.  HPI: Morgan Clay is a 75 y.o. female with history of ESRD on hemodialysis Monday Wednesday Friday had procedure for her left AV fistula last week following which patient states she had mild oozing of blood but since last evening it became more pronounced.  Patient presents to the ER.  Patient's last dialysis was on Wednesday 2 days ago.  Denies any fever chills chest pain or shortness of breath.  ED Course: In the ER patient has swelling of the left AV fistula area.  Patient with pulses are palpable.  No restriction of the movements of the extremities.  ER physician discussed with on-call vascular surgeon Dr. Donzetta Matters will be seeing patient in consult.  Patient admitted for further observation.  Labs show drop in hemoglobin from 12.9 about a week avoids around 9.7.  Platelets of 149.  COVID test is negative.  Review of Systems: As per HPI, rest all negative.   Past Medical History:  Diagnosis Date  . A-fib ([place])    on Eliquis  . AICD (automatic cardioverter/defibrillator) present 10/06/2015  . Anemia   . Angiodysplasia of small intestine ([place]) 06/10/2020   Ileum - seen on capsule endoscopy 05/2020 - ablated at colonoscopy  . ARF (acute renal failure) ([place])   . Arthritis    "qwhere" (01/03/2018)  . Asthma    reports mild asthma since childhood - had COPD on dx list from prior PCP  . Chronic bronchitis ([place])    "get it most years; not this past year though" (01/03/2018)  . Chronic kidney disease    "? stage;  followed by [facility]; [name]" (01/03/2018)  . Chronic systolic congestive heart failure ([place])   . Cirrhosis ([place]) 09/14/2020   dx by Dr Silvano Rusk   . COVID-19 05/03/2020  . DEPRESSION 12/17/2009   Annotation: PHQ-9 score = 14 done on 12/17/2009 Qualifier: Diagnosis of  By:  Hassell Done FNP, Tori Milks    . Diabetes mellitus without complication (HCC)    DIET CONTROLLED   . Diverticulosis   . Enteritis   . FIBROIDS, UTERUS 03/05/2008  . GI bleeding 01/03/2018  . Glaucoma   . Gout   . Hepatitis C    HEPATITIS C - s/p treatment with Harvoni, saw hepatology, Dawn Drazek  . History of blood transfusion ~ 11/2017  . Hyperlipemia 12/06/2012  . HYPERTENSION, BENIGN 04/24/2007  . Hypothyroidism   . LBBB (left bundle branch block)   . Lower GI bleeding    "been dealing w/it since 07/2017" (01/03/2018)  . Nonischemic cardiomyopathy ([place])   . OBESITY 05/27/2009  . OBSTRUCTIVE SLEEP APNEA 11/14/2007   no CPAP  . OSTEOPENIA 09/30/2008  . Pain and swelling of left upper extremity 08/08/2017  . Personal history of other infectious and parasitic disease    Hepatitis B  . Pneumonia    "several times" (01/03/2018)  . Pulmonary edema, acute ([place]) 08/09/2017    Past Surgical History:  Procedure Laterality Date  . APPENDECTOMY    . AV FISTULA PLACEMENT Left 08/05/2020   Procedure: LEFT UPPER EXTREMITY ARTERIOVENOUS (AV) FISTULA CREATION;  Surgeon: Cherre Robins, MD;  Location: [facility];  Service: Vascular;  Laterality: Left;  . BASCILIC VEIN TRANSPOSITION Left 09/16/2020   Procedure: LEFT SECOND STAGE BASCILIC VEIN TRANSPOSITION;  Surgeon: Cherre Robins, MD;  Location: [facility]  OR;  Service: Vascular;  Laterality: Left;  PERIPHERAL NERVE BLOCK  . CARDIAC CATHETERIZATION    . CARDIOVERSION N/A 12/14/2014   Procedure: CARDIOVERSION;  Surgeon: Dorothy Spark, MD;  Location: [facility] ENDOSCOPY;  Service: Cardiovascular;  Laterality: N/A;  . CARDIOVERSION N/A 02/26/2017   Procedure: CARDIOVERSION;  Surgeon: Evans Lance, MD;  Location: [place] CV LAB;  Service: Cardiovascular;  Laterality: N/A;  . CARDIOVERSION N/A 07/24/2017   Procedure: CARDIOVERSION;  Surgeon: Thayer Headings, MD;  Location: [facility] ENDOSCOPY;  Service: Cardiovascular;  Laterality: N/A;  . CARDIOVERSION N/A 02/12/2020    Procedure: CARDIOVERSION;  Surgeon: [name], MD;  Location: [facility] ENDOSCOPY;  Service: Cardiovascular;  Laterality: N/A;  . COLONOSCOPY N/A 11/08/2017   Procedure: COLONOSCOPY;  Surgeon: Milus Banister, MD;  Location: [facility] ENDOSCOPY;  Service: Endoscopy;  Laterality: N/A;  . COLONOSCOPY WITH PROPOFOL N/A 05/31/2020   Procedure: COLONOSCOPY WITH PROPOFOL;  Surgeon: Irene Shipper, MD;  Location: [facility] ENDOSCOPY;  Service: Endoscopy;  Laterality: N/A;  . DILATION AND CURETTAGE OF UTERUS    . ENTEROSCOPY N/A 03/02/2020   Procedure: ENTEROSCOPY;  Surgeon: Yetta Flock, MD;  Location: [facility] ENDOSCOPY;  Service: Gastroenterology;  Laterality: N/A;  . EP IMPLANTABLE DEVICE N/A 10/06/2015   Procedure: BIV ICD Generator Changeout;  Surgeon: [name], MD;  Location: [place] CV LAB;  Service: Cardiovascular;  Laterality: N/A;  . ESOPHAGOGASTRODUODENOSCOPY N/A 10/26/2017   Procedure: ESOPHAGOGASTRODUODENOSCOPY (EGD);  Surgeon: Ladene Artist, MD;  Location: [facility];  Service: Endoscopy;  Laterality: N/A;  . ESOPHAGOGASTRODUODENOSCOPY (EGD) WITH PROPOFOL N/A 11/07/2017   Procedure: ESOPHAGOGASTRODUODENOSCOPY (EGD) WITH PROPOFOL;  Surgeon: Milus Banister, MD;  Location: [facility] ENDOSCOPY;  Service: Endoscopy;  Laterality: N/A;  . ESOPHAGOGASTRODUODENOSCOPY (EGD) WITH PROPOFOL N/A 05/29/2020   Procedure: ESOPHAGOGASTRODUODENOSCOPY (EGD) WITH PROPOFOL;  Surgeon: Doran Stabler, MD;  Location: [facility];  Service: Gastroenterology;  Laterality: N/A;  . GIVENS CAPSULE STUDY N/A 05/19/2020   Procedure: GIVENS CAPSULE STUDY;  Surgeon: [name], MD;  Location: [place];  Service: Endoscopy;  Laterality: N/A;  .adm for obs since pacemaker, PA wil enter order and see pt  . GIVENS CAPSULE STUDY N/A 05/29/2020   Procedure: GIVENS CAPSULE STUDY;  Surgeon: Doran Stabler, MD;  Location: [facility];  Service: Gastroenterology;  Laterality: N/A;  . HOT HEMOSTASIS N/A 10/26/2017   Procedure:  HOT HEMOSTASIS (ARGON PLASMA COAGULATION/BICAP);  Surgeon: Ladene Artist, MD;  Location: [facility] ENDOS[facility];  Service: Endoscopy;  Laterality: N/A;  . HOT HEMOSTASIS N/A 03/02/2020   Procedure: HOT HEMOSTASIS (ARGON PLASMA COAGULATION/BICAP);  Surgeon: Yetta Flock, MD;  Location: [facility] ENDOSCOPY;  Service: Gastroenterology;  Laterality: N/A;  . HOT HEMOSTASIS N/A 05/31/2020   Procedure: HOT HEMOSTASIS (ARGON PLASMA COAGULATION/BICAP);  Surgeon: Irene Shipper, MD;  Location: [facility] ENDOSCOPY;  Service: Endoscopy;  Laterality: N/A;  . INSERT / REPLACE / REMOVE PACEMAKER  ?2008  . IR PERC TUN PERIT CATH WO PORT S&I Dartha Lodge  07/05/2020  . POLYPECTOMY  11/08/2017   Procedure: POLYPECTOMY;  Surgeon: Milus Banister, MD;  Location: [facility] ENDOSCOPY;  Service: Endoscopy;;  . PROCTOSCOPY N/A 05/22/2018   Procedure: RIGID PROCTOSCOPY;  Surgeon: Michael Boston, MD;  Location: [facility] ORS;  Service: General;  Laterality: N/A;  . RIGHT HEART CATH N/A 02/13/2018   Procedure: RIGHT HEART CATH;  Surgeon: [name], MD;  Location: [place] CV LAB;  Service: Cardiovascular;  Laterality: N/A;  . RIGHT HEART CATH N/A 06/23/2020  Procedure: RIGHT HEART CATH;  Surgeon: [name], MD;  Location: [place] CV LAB;  Service: Cardiovascular;  Laterality: N/A;  . RIGHT/LEFT HEART CATH AND CORONARY ANGIOGRAPHY N/A 12/11/2019   Procedure: RIGHT/LEFT HEART CATH AND CORONARY ANGIOGRAPHY;  Surgeon: [name], MD;  Location: [place] CV LAB;  Service: Cardiovascular;  Laterality: N/A;  . SUBMUCOSAL TATTOO INJECTION  03/02/2020   Procedure: SUBMUCOSAL TATTOO INJECTION;  Surgeon: Yetta Flock, MD;  Location: [facility] ENDOSCOPY;  Service: Gastroenterology;;  . Lia Foyer TATTOO INJECTION  05/31/2020   Procedure: SUBMUCOSAL TATTOO INJECTION;  Surgeon: Irene Shipper, MD;  Location: [place];  Service: Endoscopy;;  . TUBAL LIGATION    . XI ROBOTIC ASSISTED LOWER ANTERIOR RESECTION N/A 05/22/2018   Procedure: XI ROBOTIC  ASSISTED SIGMOID COLOECTOMY MOBILIZATION OF SPLENIC FLEXURE, FIREFLY ASSESSMENT OF PERFUSION;  Surgeon: Michael Boston, MD;  Location: [facility] ORS;  Service: General;  Laterality: N/A;  ERAS PATHWAY     reports that she has never smoked. She has never used smokeless tobacco. She reports previous alcohol use. She reports previous drug use. Drug: Marijuana.  No Known Allergies  Family History  Problem Relation Age of Onset  . Asthma Father   . Heart attack Father   . Asthma Sister   . Lung cancer Sister   . Heart attack Mother   . Stroke Brother   . Colon cancer Neg Hx   . Esophageal cancer Neg Hx   . Pancreatic cancer Neg Hx   . Stomach cancer Neg Hx   . Liver disease Neg Hx     Prior to Admission medications   Medication Sig Start Date End Date Taking? Authorizing Provider  acetaminophen (TYLENOL) 650 MG CR tablet Take 650 mg by mouth every 8 (eight) hours as needed for pain.    [provider]  amiodarone (PACERONE) 200 MG tablet Take 1 tablet (200 mg total) by mouth daily. 07/27/20   Clegg, [name] D, NP  AURYXIA 1 GM 210 MG(Fe) tablet Take 420 mg by mouth 3 (three) times daily with meals. 08/31/20   [provider]  carvedilol (COREG) 3.125 MG tablet Take 1 tablet (3.125 mg total) by mouth 2 (two) times daily with a meal. 07/12/20   Lyda Jester M, PA-C  cromolyn (OPTICROM) 4 % ophthalmic solution Place 1 drop into both eyes 2 (two) times daily. 06/14/20   [provider]  gabapentin (NEURONTIN) 300 MG capsule Take 1 capsule (300 mg total) by mouth daily. 07/12/20   Lyda Jester M, PA-C  isosorbide-hydrALAZINE (BIDIL) 20-37.5 MG tablet Take 0.5 tablets by mouth 3 (three) times daily. 07/12/20   Consuelo Pandy, PA-C  levothyroxine (SYNTHROID) 137 MCG tablet Take 1 tablet (137 mcg total) by mouth daily before breakfast. 03/11/20   Billie Ruddy, MD  methocarbamol (ROBAXIN) 500 MG tablet Take 1 tablet (500 mg total) by mouth in the morning and at bedtime.  09/14/20   [name], MD  nitroGLYCERIN (NITROSTAT) 0.4 MG SL tablet Place 0.4 mg under the tongue every 5 (five) minutes x 3 doses as needed for chest pain.    [provider]  oxyCODONE-acetaminophen (PERCOCET) 10-325 MG tablet Take 1 tablet by mouth every 6 (six) hours as needed for pain. 09/16/20 09/16/21  Setzer, Edman Circle, PA-C  traZODone (DESYREL) 50 MG tablet Take 50 mg by mouth at bedtime.    [provider]    Physical Exam: Constitutional: Moderately built and nourished. Vitals:   09/24/20 0300 09/24/20  0315 09/24/20 0330 09/24/20 0345  BP: (!) 148/67 (!) 128/55 (!) 143/55 (!) 144/62  Pulse: 73 68 66 67  Resp: 20 12 14 12   Temp:      SpO2: 97% 100% 100% 99%   Eyes: Anicteric no pallor. ENMT: No discharge from the ears eyes nose or mouth. Neck: No mass felt.  No neck rigidity. Respiratory: No rhonchi or crepitations. Cardiovascular: S1-S2 heard. Abdomen: Soft nontender bowel sound present. Musculoskeletal: Swelling of the left upper arm. Skin: No rash. Neurologic: Alert awake oriented to time place and person.  Moves all extremities. Psychiatric: Appears normal.  Normal affect.   Labs on Admission: I have personally reviewed following labs and imaging studies  CBC: Recent Labs  Lab 09/23/20 2329  WBC 6.3  NEUTROABS 3.7  HGB 9.7*  HCT 30.1*  MCV 92.3  PLT 563*   Basic Metabolic Panel: Recent Labs  Lab 09/23/20 2329  NA 132*  K 4.2  CL 91*  CO2 27  GLUCOSE 158*  BUN 25*  CREATININE 5.48*  CALCIUM 9.6   GFR: Estimated Creatinine Clearance: 7.3 mL/min (A) (by C-G formula based on SCr of 5.48 mg/dL (H)). Liver Function Tests: No results for input(s): AST, ALT, ALKPHOS, BILITOT, PROT, ALBUMIN in the last 168 hours. No results for input(s): LIPASE, AMYLASE in the last 168 hours. No results for input(s): AMMONIA in the last 168 hours. Coagulation Profile: No results for input(s): INR, PROTIME in the last 168 hours. Cardiac  Enzymes: No results for input(s): CKTOTAL, CKMB, CKMBINDEX, TROPONINI in the last 168 hours. BNP (last 3 results) No results for input(s): PROBNP in the last 8760 hours. HbA1C: No results for input(s): HGBA1C in the last 72 hours. CBG: No results for input(s): GLUCAP in the last 168 hours. Lipid Profile: No results for input(s): CHOL, HDL, LDLCALC, TRIG, CHOLHDL, LDLDIRECT in the last 72 hours. Thyroid Function Tests: No results for input(s): TSH, T4TOTAL, FREET4, T3FREE, THYROIDAB in the last 72 hours. Anemia Panel: No results for input(s): VITAMINB12, FOLATE, FERRITIN, TIBC, IRON, RETICCTPCT in the last 72 hours. Urine analysis:    Component Value Date/Time   COLORURINE YELLOW 03/05/2018 0902   APPEARANCEUR CLEAR 03/05/2018 0902   LABSPEC 1.008 03/05/2018 0902   PHURINE 5.0 03/05/2018 0902   GLUCOSEU NEGATIVE 03/05/2018 0902   HGBUR NEGATIVE 03/05/2018 0902   HGBUR negative 02/28/2008 0000   BILIRUBINUR NEGATIVE 03/05/2018 0902   KETONESUR 5 (A) 03/05/2018 0902   PROTEINUR NEGATIVE 03/05/2018 0902   UROBILINOGEN 1.0 07/19/2010 1752   NITRITE NEGATIVE 03/05/2018 0902   LEUKOCYTESUR NEGATIVE 03/05/2018 0902   Sepsis Labs: @LABRCNTIP (procalcitonin:4,lacticidven:4) ) Recent Results (from the past 240 hour(s))  SARS CORONAVIRUS 2 (TAT 6-24 HRS) Nasopharyngeal Nasopharyngeal Swab     Status: None   Collection Time: 09/14/20  1:10 PM   Specimen: Nasopharyngeal Swab  Result Value Ref Range Status   SARS Coronavirus 2 NEGATIVE NEGATIVE Final    Comment: (NOTE) SARS-CoV-2 target nucleic acids are NOT DETECTED.  The SARS-CoV-2 RNA is generally detectable in upper and lower respiratory specimens during the acute phase of infection. Negative results do not preclude SARS-CoV-2 infection, do not rule out co-infections with other pathogens, and should not be used as the sole basis for treatment or other patient management decisions. Negative results must be combined with clinical  observations, patient history, and epidemiological information. The expected result is Negative.  Fact Sheet for Patients: SugarRoll.be  Fact Sheet for Healthcare Providers: https://www.woods-mathews.com/  This test is not yet approved or  cleared by the [place] and  has been authorized for detection and/or diagnosis of SARS-CoV-2 by FDA under an Emergency Use Authorization (EUA). This EUA will remain  in effect (meaning this test can be used) for the duration of the COVID-19 declaration under Se ction 564(b)(1) of the Act, 21 U.S.C. section 360bbb-3(b)(1), unless the authorization is terminated or revoked sooner.  Performed at [facility], [place] [address], [place], [place] [address]   Resp Panel by RT-PCR (Flu A&B, Covid) Nasopharyngeal Swab     Status: None   Collection Time: 09/23/20 11:52 PM   Specimen: Nasopharyngeal Swab; Nasopharyngeal(NP) swabs in vial transport medium  Result Value Ref Range Status   SARS Coronavirus 2 by RT PCR NEGATIVE NEGATIVE Final    Comment: (NOTE) SARS-CoV-2 target nucleic acids are NOT DETECTED.  The SARS-CoV-2 RNA is generally detectable in upper respiratory specimens during the acute phase of infection. The lowest concentration of SARS-CoV-2 viral copies this assay can detect is 138 copies/mL. A negative result does not preclude SARS-Cov-2 infection and should not be used as the sole basis for treatment or other patient management decisions. A negative result may occur with  improper specimen collection/handling, submission of specimen other than nasopharyngeal swab, presence of viral mutation(s) within the areas targeted by this assay, and inadequate number of viral copies(<138 copies/mL). A negative result must be combined with clinical observations, patient history, and epidemiological information. The expected result is Negative.  Fact Sheet for Patients:   EntrepreneurPulse.com.au  Fact Sheet for Healthcare Providers:  IncredibleEmployment.be  This test is no t yet approved or cleared by the [place] FDA and  has been authorized for detection and/or diagnosis of SARS-CoV-2 by FDA under an Emergency Use Authorization (EUA). This EUA will remain  in effect (meaning this test can be used) for the duration of the COVID-19 declaration under Section 564(b)(1) of the Act, 21 U.S.C.section 360bbb-3(b)(1), unless the authorization is terminated  or revoked sooner.       Influenza A by PCR NEGATIVE NEGATIVE Final   Influenza B by PCR NEGATIVE NEGATIVE Final    Comment: (NOTE) The Xpert Xpress SARS-CoV-2/FLU/RSV plus assay is intended as an aid in the diagnosis of influenza from Nasopharyngeal swab specimens and should not be used as a sole basis for treatment. Nasal washings and aspirates are unacceptable for Xpert Xpress SARS-CoV-2/FLU/RSV testing.  Fact Sheet for Patients: EntrepreneurPulse.com.au  Fact Sheet for Healthcare Providers: IncredibleEmployment.be  This test is not yet approved or cleared by the [place] FDA and has been authorized for detection and/or diagnosis of SARS-CoV-2 by FDA under an Emergency Use Authorization (EUA). This EUA will remain in effect (meaning this test can be used) for the duration of the COVID-19 declaration under Section 564(b)(1) of the Act, 21 U.S.C. section 360bbb-3(b)(1), unless the authorization is terminated or revoked.  Performed at [facility], [place] [address] [place], [place] [address]      Radiological Exams on Admission: No results found.  EKG: Independently reviewed.  Normal sinus rhythm.  Assessment/Plan Principal Problem:   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) Active Problems:   Hypothyroidism   Nonischemic cardiomyopathy (HCC)   Type 2 diabetes,  controlled, with renal manifestation (HCC)   Chronic obstructive pulmonary disease (HCC)   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 (HCV) (HCC)   Acute blood loss anemia   Bleeding pseudoaneurysm of left brachiocephalic AV fistula ([place])    1. Bleeding left AV fistula -  on-call vascular surgeon has been notified.  We will keep patient n.p.o. in anticipation of possible procedure check serial CBC.  Type and screen. 2. Hypertension on BiDil and Coreg. 3. History of nonischemic cardiomyopathy fluid managed per dialysis. 4. ESRD on hemodialysis on Monday Wednesday Friday please consult nephrology. 5. COPD not actively wheezing. 6. History of A. fib not on anticoagulation secondary to recurrent GI bleed.  On Coreg and amiodarone. 7. Hypothyroidism on Synthroid. 8. Acute blood loss anemia -anemia is further worsening from baseline.  Likely from blood loss.  Follow CBC. 9. Thrombocytopenia appears to be new could be from cirrhosis. 10. History of cirrhosis of the liver. 11. Diabetes mellitus is mentioned in the chart patient is not on any medication.  Follow CBGs.   DVT prophylaxis: SCDs.  Avoiding anticoagulation due to bleeding in the left AV fistula. Code Status: Full code. Family Communication: Discussed with patient. Disposition Plan: Home. Consults called: Vascular surgeon. Admission status: Observation.   Rise Patience MD [facility] Pager [phone].  If 7PM-7AM, please contact night-coverage [email or web address] Password [facility]  09/24/2020, 4:13 AM

## 2020-09-24 NOTE — Anesthesia Preprocedure Evaluation (Addendum)
Anesthesia Evaluation  Patient identified by MRN, date of birth, ID band Patient awake    Reviewed: Allergy & Precautions, H&P , NPO status , Patient's Chart, lab work & pertinent test results  Airway Mallampati: II  TM Distance: >3 FB Neck ROM: full    Dental  (+) Poor Dentition, Missing, Dental Advisory Given, Chipped   Pulmonary asthma , sleep apnea , COPD,  09/14/2020 SARS coronavirus NEG   breath sounds clear to auscultation       Cardiovascular hypertension, Pt. on medications and Pt. on home beta blockers +CHF  + dysrhythmias Atrial Fibrillation + Cardiac Defibrillator (has never shocked her)  Rhythm:regular Rate:Normal  06/2020 ECHO: 55-60%. The LV has normal function, no regional wall motion abnormalities, moderate concentric LVH, Grade II diastolic dysfunction (pseudonormalization). the interventricular septum is flattened in systole and diastole, Right ventricular systolic function is low normal. RV moderately enlarged. There is mildly elevated  pulmonary artery systolic pressure. Mild MR, severe TR, moderate AI   Neuro/Psych PSYCHIATRIC DISORDERS Depression negative neurological ROS     GI/Hepatic negative GI ROS, (+) Cirrhosis       , Hepatitis -, C  Endo/Other  diabetes, Type 2Hypothyroidism   Renal/GU ESRFRenal disease     Musculoskeletal  (+) Arthritis ,   Abdominal (+) - obese,   Peds  Hematology  (+) Blood dyscrasia (Hb 9.5), anemia ,   Anesthesia Other Findings   Reproductive/Obstetrics                           Anesthesia Physical Anesthesia Plan  ASA: III  Anesthesia Plan: General   Post-op Pain Management:    Induction: Intravenous  PONV Risk Score and Plan: 3 and Treatment may vary due to age or medical condition, Ondansetron and Dexamethasone  Airway Management Planned: Oral ETT  Additional Equipment: None  Intra-op Plan:   Post-operative Plan: Extubation  in OR  Informed Consent: I have reviewed the patients History and Physical, chart, labs and discussed the procedure including the risks, benefits and alternatives for the proposed anesthesia with the patient or authorized representative who has indicated his/her understanding and acceptance.     Dental advisory given  Plan Discussed with: CRNA, Anesthesiologist and Surgeon  Anesthesia Plan Comments: (Discussed with Dr. Donnetta Hutching, pt's arm bleeding again, will proceed urgently with GETA)       Anesthesia Quick Evaluation

## 2020-09-25 ENCOUNTER — Encounter (HOSPITAL_COMMUNITY): Payer: Self-pay | Admitting: Vascular Surgery

## 2020-09-25 ENCOUNTER — Inpatient Hospital Stay (HOSPITAL_COMMUNITY): Payer: Medicare Other

## 2020-09-25 DIAGNOSIS — E1122 Type 2 diabetes mellitus with diabetic chronic kidney disease: Secondary | ICD-10-CM | POA: Diagnosis not present

## 2020-09-25 DIAGNOSIS — T148XXA Other injury of unspecified body region, initial encounter: Secondary | ICD-10-CM

## 2020-09-25 DIAGNOSIS — N184 Chronic kidney disease, stage 4 (severe): Secondary | ICD-10-CM

## 2020-09-25 DIAGNOSIS — E038 Other specified hypothyroidism: Secondary | ICD-10-CM

## 2020-09-25 DIAGNOSIS — T82838A Hemorrhage of vascular prosthetic devices, implants and grafts, initial encounter: Secondary | ICD-10-CM | POA: Diagnosis not present

## 2020-09-25 LAB — CBC
HCT: 27.5 % — ABNORMAL LOW (ref 36.0–46.0)
Hemoglobin: 8.9 g/dL — ABNORMAL LOW (ref 12.0–15.0)
MCH: 29.8 pg (ref 26.0–34.0)
MCHC: 32.4 g/dL (ref 30.0–36.0)
MCV: 92 fL (ref 80.0–100.0)
Platelets: 154 10*3/uL (ref 150–400)
RBC: 2.99 MIL/uL — ABNORMAL LOW (ref 3.87–5.11)
RDW: 17.2 % — ABNORMAL HIGH (ref 11.5–15.5)
WBC: 6.6 10*3/uL (ref 4.0–10.5)
nRBC: 0 % (ref 0.0–0.2)

## 2020-09-25 LAB — GLUCOSE, CAPILLARY
Glucose-Capillary: 100 mg/dL — ABNORMAL HIGH (ref 70–99)
Glucose-Capillary: 105 mg/dL — ABNORMAL HIGH (ref 70–99)
Glucose-Capillary: 136 mg/dL — ABNORMAL HIGH (ref 70–99)
Glucose-Capillary: 137 mg/dL — ABNORMAL HIGH (ref 70–99)

## 2020-09-25 LAB — BASIC METABOLIC PANEL
Anion gap: 13 (ref 5–15)
BUN: 35 mg/dL — ABNORMAL HIGH (ref 8–23)
CO2: 25 mmol/L (ref 22–32)
Calcium: 8.9 mg/dL (ref 8.9–10.3)
Chloride: 93 mmol/L — ABNORMAL LOW (ref 98–111)
Creatinine, Ser: 5.87 mg/dL — ABNORMAL HIGH (ref 0.44–1.00)
GFR, Estimated: 7 mL/min — ABNORMAL LOW (ref >60)
Glucose, Bld: 89 mg/dL (ref 70–99)
Potassium: 3.7 mmol/L (ref 3.5–5.1)
Sodium: 131 mmol/L — ABNORMAL LOW (ref 135–145)

## 2020-09-25 MED ORDER — MIDODRINE HCL 5 MG PO TABS
ORAL_TABLET | ORAL | Status: AC
  Filled 2020-09-25: qty 2

## 2020-09-25 MED ORDER — DARBEPOETIN ALFA 40 MCG/0.4ML IJ SOSY
PREFILLED_SYRINGE | INTRAMUSCULAR | Status: AC
  Filled 2020-09-25: qty 0.4

## 2020-09-25 MED ORDER — DARBEPOETIN ALFA 40 MCG/0.4ML IJ SOSY
40.0000 ug | PREFILLED_SYRINGE | Freq: Once | INTRAMUSCULAR | Status: AC
  Administered 2020-09-25: 40 ug via INTRAVENOUS

## 2020-09-25 MED ORDER — HYDROMORPHONE HCL 1 MG/ML IJ SOLN
0.2500 mg | INTRAMUSCULAR | Status: DC | PRN
  Administered 2020-09-25: 0.25 mg via INTRAVENOUS

## 2020-09-25 MED ORDER — HYDROCODONE-ACETAMINOPHEN 5-325 MG PO TABS
1.0000 | ORAL_TABLET | ORAL | Status: DC | PRN
  Administered 2020-09-25 (×2): 2 via ORAL
  Administered 2020-09-26: 1 via ORAL
  Filled 2020-09-25: qty 1
  Filled 2020-09-25 (×2): qty 2

## 2020-09-25 MED ORDER — HYDROMORPHONE HCL 1 MG/ML IJ SOLN
INTRAMUSCULAR | Status: AC
  Filled 2020-09-25: qty 1

## 2020-09-25 MED ORDER — MIDODRINE HCL 5 MG PO TABS
10.0000 mg | ORAL_TABLET | Freq: Once | ORAL | Status: AC
  Administered 2020-09-25: 10 mg via ORAL

## 2020-09-25 MED ORDER — HYDROMORPHONE HCL 1 MG/ML IJ SOLN
0.2500 mg | INTRAMUSCULAR | Status: DC | PRN
  Administered 2020-09-25: 0.25 mg via INTRAVENOUS
  Filled 2020-09-25 (×2): qty 1

## 2020-09-25 MED ORDER — MORPHINE SULFATE (PF) 2 MG/ML IV SOLN
INTRAVENOUS | Status: AC
  Filled 2020-09-25: qty 1

## 2020-09-25 NOTE — Progress Notes (Signed)
Pain management appears to have been successful for patient as previously indicated.  Mobility has increased and she is currently in BR independently performing ADLs with supervision.  Have indicated to patient that before her pain reaches a level 5, to please ask for something for pain.  She is agreeable to this regimen at this time.  Pt removed ace wrap from LUE to "allow it to breath" before allowing me to re-wrap.

## 2020-09-25 NOTE — Progress Notes (Addendum)
[facility] Progress Note   Subjective:  Seen in HD unit. BP soft at start. UF goal lowered to 1.5 She is c/o pain in left arm s/p hematoma evacuation   Objective Vitals:   09/25/20 0601 09/25/20 0700 09/25/20 0705 09/25/20 0730  BP: (!) 132/45 (!) 106/50 (!) 98/44 (!) 109/48  Pulse: 62 60 60 61  Resp: 18 18    Temp: 98.9 F (37.2 C) 98.2 F (36.8 C)    TempSrc: Oral Oral    SpO2: 93% 98%    Weight:  61.3 kg    Height:         Additional Objective Labs: Basic Metabolic Panel: Recent Labs  Lab 09/23/20 2329 09/25/20 0017  NA 132* 131*  K 4.2 3.7  CL 91* 93*  CO2 27 25  GLUCOSE 158* 89  BUN 25* 35*  CREATININE 5.48* 5.87*  CALCIUM 9.6 8.9   CBC: Recent Labs  Lab 09/23/20 2329 09/24/20 0429 09/24/20 1233 09/25/20 0017  WBC 6.3 7.6 6.9 6.6  NEUTROABS 3.7  --   --   --   HGB 9.7* 9.5* 9.7* 8.9*  HCT 30.1* 29.4* 30.3* 27.5*  MCV 92.3 92.5 92.9 92.0  PLT 149* 142* 129* 154   Blood Culture    Component Value Date/Time   SDES BLOOD HAND LEFT 07/19/2010 1759   SPECREQUEST BOTTLES DRAWN AEROBIC ONLY 5CC 07/19/2010 1759   CULT NO GROWTH 5 DAYS 07/19/2010 1759   REPTSTATUS 07/26/2010 FINAL 07/19/2010 1759     Physical Exam General: Well appearing older woman,nad  Heart: RRR  Lungs: Clear, bilaterally  Abdomen: soft  Extremities: No LE edema  Dialysis Access: R IJ TDC   Medications:  . amiodarone  200 mg Oral Daily  . carvedilol  3.125 mg Oral BID WC  . Chlorhexidine Gluconate Cloth  6 each Topical Daily  . cromolyn  1 drop Both Eyes BID  . ferric citrate  420 mg Oral TID WC  . gabapentin  300 mg Oral Daily  . isosorbide-hydrALAZINE  0.5 tablet Oral TID  . levothyroxine  137 mcg Oral QAC breakfast  . midodrine      . traZODone  50 mg Oral QHS    Dialysis Orders:  MWF [facility]  3hrs 76min, BFR 400, DFR 600,  EDW59, 2K/ 2Ca  Access: TDC, LU AVF   Heparin none Hectorol 64mcg IV qHD   Assessment/Plan: 1. L arm hematoma  s/p 2nd stage BVT fistula - s/p evacuation today by Dr. Donnetta Hutching.   2. Dialysis dependent AKI -  On HD MWF.  K 3.7, BUN 35, SCr 5.87 this am. No signs of respiratory distress or volume overload.  Due to increased patient census on MWF will have HD Saturday off schedule.  Can resume regular schedule on 09/27/20.  3. Hypertension/volume  - BP controlled  On low dose Coreg. Midodrine with HD this am for BP support.  Follow weights.  4. Anemia of CKD - Hgb 8.9  Tsat from 5/18 30%.  Will order  ESA with HD today  5. Secondary Hyperparathyroidism -  Ca in goal. Check phos. Continue binders/VDRA  6. Nutrition - Renal diet w/fluid restrictions 7. Combined diastolic/systolic HF - appears euvolemic 8. A fib - On amiodarone -- per primary   Lynnda Child PA-C [facility] 09/25/2020,8:02 AM   Nephrology attending: Patient was seen and examined in dialysis unit.  I reviewed the chart.  I agree with assessment plan as outlined above.  Intradialytic hypotension  therefore reduced UF goal to 1.5 L and added midodrine.  She is complaining of pain at this site of surgery, distal blood flow is okay.  Vascular surgeon is following.  Discussed with the primary team.  Using [facility] for the access.  Continue ESA for anemia.  Katheran James, MD [facility]

## 2020-09-25 NOTE — Progress Notes (Signed)
Pt returned from HD. Pt in apparent distress, complaining of "stabbing" pain in LUE.  States she "can't stand it".  It was reported to this nurse that Morphine had been given to her in HD without effect.  Dr. Waldron [facility] notified.  See NO.  Plan is to optimize pain management regimen with use of medication and adjunctive therapy (I.e elevation of extremity with repositioning, application of ice, etc.).  Will re-assess plan as needed.

## 2020-09-25 NOTE — Progress Notes (Signed)
PROGRESS NOTE    Morgan Clay  YQM:[id number] DOB: March 29, 1946 DOA: 09/23/2020 PCP: Billie Ruddy, MD    Chief Complaint  Patient presents with  . Vascular Access Problem    Brief Narrative:      Morgan Clay is a 75 y.o. female with history of ESRD on hemodialysis Monday Wednesday Friday had procedure for her left AV fistula last week, and is to ED with complaints as the AV fistula site, she was seen by surgery, status postevacuation 5/28 by Dr. Donnetta Hutching.    Assessment & Plan:   Principal Problem:   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) Active Problems:   Hypothyroidism   Nonischemic cardiomyopathy (HCC)   Type 2 diabetes, controlled, with renal manifestation (HCC)   Chronic obstructive pulmonary disease (HCC)   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 (HCV) (HCC)   Acute blood loss anemia   Bleeding pseudoaneurysm of left brachiocephalic AV fistula (HCC)   Bleeding left AV fistula  -s/p evacuation today by Dr. Donnetta Hutching -She is complaining of significant pain at fistula site, have discussed with vascular surgery, she has no evidence of coronary compromise distal to it, this is expected postoperatively, will continue with Vicodin. -  keep to dry Kerlix over the incisions and then wrap the upper arm with a 4 inch Ace per general surgery.  Hypertension - On Coreg and BiDil, midodrine for dialysis today to support low blood pressure  History of nonischemic cardiomyopathy  - fluid managed per dialysis. -On BiDil and beta-blockers  ESRD  -Dialysis per renal   COPD  - not actively wheezing.  History of A. fib not on anticoagulation secondary to recurrent GI bleed.  On Coreg and amiodarone.  Hypothyroidism on Synthroid.  Acute blood loss anemia -anemia is further worsening from baseline.  Likely from blood loss.  Follow CBC.  Thrombocytopenia appears to be new could be from cirrhosis.  History of cirrhosis of the  liver.  Diabetes mellitus is mentioned in the chart patient is not on any medication.  Follow CBGs.   DVT prophylaxis: No chemical prophylaxis given bleeding at left AV fistula site,SVD Code Status: Full Family Communication: None at bedside Disposition:   Status is: Inpatient  Remains inpatient appropriate because:Ongoing diagnostic testing needed not appropriate for outpatient work up and Unsafe d/c plan   Dispo: The patient is from: Home              Anticipated d/c is to: Home              Patient currently is not medically stable to d/c.   Difficult to place patient No       Consultants:   Renal Vascular surgery   Subjective:  He complains of pain at the left upper extremity at the surgical site.  Objective: Vitals:   09/25/20 0900 09/25/20 0930 09/25/20 1000 09/25/20 1030  BP: (!) 116/48 (!) 117/46 (!) 99/50 (!) 114/51  Pulse: 61 62 68 73  Resp:    18  Temp:      TempSrc:      SpO2:    99%  Weight:    60.2 kg  Height:        Intake/Output Summary (Last 24 hours) at 09/25/2020 1041 Last data filed at 09/25/2020 1030 Gross per 24 hour  Intake 220 ml  Output 1200 ml  Net -980 ml   Filed Weights   09/24/20 0824 09/25/20 0700 09/25/20 1030  Weight: 59.4 kg 61.3 kg 60.2 kg  Examination:  Awake Alert, Oriented X 3, No new F.N deficits, Normal affect Symmetrical Chest wall movement, Good air movement bilaterally, CTAB RRR,No Gallops,Rubs or new Murmurs, No Parasternal Heave +ve B.Sounds, Abd Soft, No tenderness, No rebound - guarding or rigidity. No Cyanosis, left upper extremity bandaged with Ace wrap.  Good distal pulses    Data Reviewed: I have personally reviewed following labs and imaging studies  CBC: Recent Labs  Lab 09/23/20 2329 09/24/20 0429 09/24/20 1233 09/25/20 0017  WBC 6.3 7.6 6.9 6.6  NEUTROABS 3.7  --   --   --   HGB 9.7* 9.5* 9.7* 8.9*  HCT 30.1* 29.4* 30.3* 27.5*  MCV 92.3 92.5 92.9 92.0  PLT 149* 142* 129* 154     Basic Metabolic Panel: Recent Labs  Lab 09/23/20 2329 09/25/20 0017  NA 132* 131*  K 4.2 3.7  CL 91* 93*  CO2 27 25  GLUCOSE 158* 89  BUN 25* 35*  CREATININE 5.48* 5.87*  CALCIUM 9.6 8.9    GFR: Estimated Creatinine Clearance: 6.8 mL/min (A) (by C-G formula based on SCr of 5.87 mg/dL (H)).  Liver Function Tests: No results for input(s): AST, ALT, ALKPHOS, BILITOT, PROT, ALBUMIN in the last 168 hours.  CBG: Recent Labs  Lab 09/24/20 1030 09/24/20 1225 09/24/20 1709 09/24/20 2343 09/25/20 0612  GLUCAP 97 88 121* 100* 105*     Recent Results (from the past 240 hour(s))  Resp Panel by RT-PCR (Flu A&B, Covid) Nasopharyngeal Swab     Status: None   Collection Time: 09/23/20 11:52 PM   Specimen: Nasopharyngeal Swab; Nasopharyngeal(NP) swabs in vial transport medium  Result Value Ref Range Status   SARS Coronavirus 2 by RT PCR NEGATIVE NEGATIVE Final    Comment: (NOTE) SARS-CoV-2 target nucleic acids are NOT DETECTED.  The SARS-CoV-2 RNA is generally detectable in upper respiratory specimens during the acute phase of infection. The lowest concentration of SARS-CoV-2 viral copies this assay can detect is 138 copies/mL. A negative result does not preclude SARS-Cov-2 infection and should not be used as the sole basis for treatment or other patient management decisions. A negative result may occur with  improper specimen collection/handling, submission of specimen other than nasopharyngeal swab, presence of viral mutation(s) within the areas targeted by this assay, and inadequate number of viral copies(<138 copies/mL). A negative result must be combined with clinical observations, patient history, and epidemiological information. The expected result is Negative.  Fact Sheet for Patients:  EntrepreneurPulse.com.au  Fact Sheet for Healthcare Providers:  IncredibleEmployment.be  This test is no t yet approved or cleared by the  [place] FDA and  has been authorized for detection and/or diagnosis of SARS-CoV-2 by FDA under an Emergency Use Authorization (EUA). This EUA will remain  in effect (meaning this test can be used) for the duration of the COVID-19 declaration under Section 564(b)(1) of the Act, 21 U.S.C.section 360bbb-3(b)(1), unless the authorization is terminated  or revoked sooner.       Influenza A by PCR NEGATIVE NEGATIVE Final   Influenza B by PCR NEGATIVE NEGATIVE Final    Comment: (NOTE) The Xpert Xpress SARS-CoV-2/FLU/RSV plus assay is intended as an aid in the diagnosis of influenza from Nasopharyngeal swab specimens and should not be used as a sole basis for treatment. Nasal washings and aspirates are unacceptable for Xpert Xpress SARS-CoV-2/FLU/RSV testing.  Fact Sheet for Patients: EntrepreneurPulse.com.au  Fact Sheet for Healthcare Providers: IncredibleEmployment.be  This test is not yet approved or cleared by the [place]  FDA and has been authorized for detection and/or diagnosis of SARS-CoV-2 by FDA under an Emergency Use Authorization (EUA). This EUA will remain in effect (meaning this test can be used) for the duration of the COVID-19 declaration under Section 564(b)(1) of the Act, 21 U.S.C. section 360bbb-3(b)(1), unless the authorization is terminated or revoked.  Performed at [facility], [place] [address], [place], [place] [address]          Radiology Studies: No results found.      Scheduled Meds: . amiodarone  200 mg Oral Daily  . carvedilol  3.125 mg Oral BID WC  . Chlorhexidine Gluconate Cloth  6 each Topical Daily  . cromolyn  1 drop Both Eyes BID  . ferric citrate  420 mg Oral TID WC  . gabapentin  300 mg Oral Daily  . isosorbide-hydrALAZINE  0.5 tablet Oral TID  . levothyroxine  137 mcg Oral QAC breakfast  . traZODone  50 mg Oral QHS   Continuous Infusions:    LOS: 1 day      Phillips Climes, MD [facility]   To contact the attending provider between 7A-7P or the covering provider during after hours 7P-7A, please log into the web site [email or web address] and access using universal [place] password for that web site. If you do not have the password, please call the hospital operator.  09/25/2020, 10:41 AM

## 2020-09-25 NOTE — Progress Notes (Signed)
   VASCULAR SURGERY ASSESSMENT & PLAN:   POD 1 S/P EVACUATION HEMATOMA LEFT ARM: I changed her dressing this morning.  The wounds look fine.  I have instructed her on how to keep to dry Kerlix over the incisions and then wrap the upper arm with a 4 inch Ace.  It sounds like her daughter at home can help her with this.  She can be discharged from our standpoint and I have arrange follow-up as an outpatient in 2 to 3 weeks.  She can be discharged from our standpoint.  SUBJECTIVE:   No complaints this morning.  PHYSICAL EXAM:   Vitals:   09/24/20 1230 09/24/20 2151 09/25/20 0145 09/25/20 0601  BP: 131/60 (!) 106/47 (!) 108/49 (!) 132/45  Pulse: 64 61 61 62  Resp: 17 18 17 18   Temp: 97.8 F (36.6 C) 99 F (37.2 C) 98.2 F (36.8 C) 98.9 F (37.2 C)  TempSrc: Oral Oral Oral Oral  SpO2: 100% 99% 97% 93%  Weight:      Height:       I changed her dressing.  Wounds look fine.  The dressing is reapplied.  She has a palpable radial pulse on the left.  LABS:   Lab Results  Component Value Date   WBC 6.6 09/25/2020   HGB 8.9 (L) 09/25/2020   HCT 27.5 (L) 09/25/2020   MCV 92.0 09/25/2020   PLT 154 09/25/2020   Lab Results  Component Value Date   CREATININE 5.87 (H) 09/25/2020   Lab Results  Component Value Date   INR 1.1 (H) 09/14/2020   CBG (last 3)  Recent Labs    09/24/20 1709 09/24/20 2343 09/25/20 0612  GLUCAP 121* 100* 105*    PROBLEM LIST:    Principal Problem:   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) Active Problems:   Hypothyroidism   Nonischemic cardiomyopathy (HCC)   Type 2 diabetes, controlled, with renal manifestation (HCC)   Chronic obstructive pulmonary disease ([place])   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 (HCV) ([place])   Acute blood loss anemia   Bleeding pseudoaneurysm of left brachiocephalic AV fistula (HCC)   CURRENT MEDS:   . amiodarone  200 mg Oral Daily  . carvedilol  3.125 mg Oral BID WC   . Chlorhexidine Gluconate Cloth  6 each Topical Daily  . cromolyn  1 drop Both Eyes BID  . ferric citrate  420 mg Oral TID WC  . gabapentin  300 mg Oral Daily  . isosorbide-hydrALAZINE  0.5 tablet Oral TID  . levothyroxine  137 mcg Oral QAC breakfast  . traZODone  50 mg Oral QHS    Deitra Mayo Office: [phone] 09/25/2020

## 2020-09-26 DIAGNOSIS — I428 Other cardiomyopathies: Secondary | ICD-10-CM | POA: Diagnosis not present

## 2020-09-26 DIAGNOSIS — B1920 Unspecified viral hepatitis C without hepatic coma: Secondary | ICD-10-CM

## 2020-09-26 DIAGNOSIS — G8918 Other acute postprocedural pain: Secondary | ICD-10-CM | POA: Diagnosis not present

## 2020-09-26 DIAGNOSIS — T82838A Hemorrhage of vascular prosthetic devices, implants and grafts, initial encounter: Secondary | ICD-10-CM | POA: Diagnosis not present

## 2020-09-26 DIAGNOSIS — K7469 Other cirrhosis of liver: Secondary | ICD-10-CM | POA: Diagnosis not present

## 2020-09-26 LAB — GLUCOSE, CAPILLARY
Glucose-Capillary: 105 mg/dL — ABNORMAL HIGH (ref 70–99)
Glucose-Capillary: 96 mg/dL (ref 70–99)

## 2020-09-26 MED ORDER — OXYCODONE-ACETAMINOPHEN 10-325 MG PO TABS: 1.0000 | ORAL_TABLET | Freq: Four times a day (QID) | ORAL | 0 refills | Status: DC | PRN

## 2020-09-26 NOTE — Progress Notes (Signed)
Physical Therapy Note   Eval complete with full note to follow;   LUE pain cut short any attempts at ambulation at time of eval; Will order OT for ADLs per protocol;   Still, per Nursing Note, she can manage well walking to and managing in the bathroom;   Ms. Wareing also reports she can walk well, and got up and sat at the window yesterday;   Given that she is walking well in her room, I anticipate that she will be able to dc to her home;   Recommend considering HHPT and HHOT follow up for activity and ADLs in the home;  Will also need to consider transportation options to HD -- it looks like her LUE pain will make driving difficult;    Roney Marion, [place] Pager [phone] Office [phone]

## 2020-09-26 NOTE — TOC Transition Note (Signed)
Transition of Care [facility]) - CM/SW Discharge Note   Patient Details  Name: Morgan Clay MRN: [id number] Date of Birth: [birth year]  Transition of Care [facility]) CM/SW Contact:  Bartholomew Crews, RN Phone Number: [phone] 09/26/2020, 4:02 PM   Clinical Narrative:     [place] referral accepted by [place]. Patient notified of accepting agency. No further TOC needs identified.   Final next level of care: [place] Barriers to Discharge: No Barriers Identified   Patient Goals and CMS Choice Patient states their goals for this hospitalization and ongoing recovery are:: return home with family/friend support CMS Medicare.gov Compare Post Acute Care list provided to:: Patient Choice offered to / list presented to : Patient  Discharge Placement                       Discharge Plan and Services In-house Referral: NA Discharge Planning Services: CM Consult Post Acute Care Choice: Home Health          DME Arranged: N/A DME Agency: NA       HH Arranged: RN,PT,OT          Social Determinants of Health (SDOH) Interventions     Readmission Risk Interventions Readmission Risk Prevention Plan 06/29/2020  Transportation Screening Complete  PCP or Specialist Appt within 3-5 Days (No Data)  [place] or [place] Complete  Social Work Consult for [place] Planning/Counseling Complete  Palliative Care Screening Not Applicable  Medication Review Press photographer) Complete  Some recent data might be hidden

## 2020-09-26 NOTE — Progress Notes (Signed)
  [facility] Progress Note   Subjective:  Seen in room. Left arm pain better today. No cp, sob.  Plans for discharge today.   Objective Vitals:   09/25/20 1030 09/25/20 1805 09/25/20 2020 09/26/20 0557  BP: (!) 114/51 (!) 108/51 112/61 (!) 102/39  Pulse: 73 67 74 62  Resp: 18 18 18 18   Temp:  98.5 F (36.9 C) 98.4 F (36.9 C) 99.3 F (37.4 C)  TempSrc:  Oral Oral Oral  SpO2: 99% 100% 100% 97%  Weight: 60.2 kg     Height:         Additional Objective Labs: Basic Metabolic Panel: Recent Labs  Lab 09/23/20 2329 09/25/20 0017  NA 132* 131*  K 4.2 3.7  CL 91* 93*  CO2 27 25  GLUCOSE 158* 89  BUN 25* 35*  CREATININE 5.48* 5.87*  CALCIUM 9.6 8.9   CBC: Recent Labs  Lab 09/23/20 2329 09/24/20 0429 09/24/20 1233 09/25/20 0017  WBC 6.3 7.6 6.9 6.6  NEUTROABS 3.7  --   --   --   HGB 9.7* 9.5* 9.7* 8.9*  HCT 30.1* 29.4* 30.3* 27.5*  MCV 92.3 92.5 92.9 92.0  PLT 149* 142* 129* 154   Blood Culture    Component Value Date/Time   SDES BLOOD HAND LEFT 07/19/2010 1759   SPECREQUEST BOTTLES DRAWN AEROBIC ONLY 5CC 07/19/2010 1759   CULT NO GROWTH 5 DAYS 07/19/2010 1759   REPTSTATUS 07/26/2010 FINAL 07/19/2010 1759     Physical Exam General: Well appearing older woman,nad  Heart: RRR  Lungs: Clear, bilaterally  Abdomen: soft  Extremities: No LE edema  Dialysis Access: R IJ TDC   Medications:  . amiodarone  200 mg Oral Daily  . carvedilol  3.125 mg Oral BID WC  . Chlorhexidine Gluconate Cloth  6 each Topical Daily  . cromolyn  1 drop Both Eyes BID  . ferric citrate  420 mg Oral TID WC  . gabapentin  300 mg Oral Daily  . isosorbide-hydrALAZINE  0.5 tablet Oral TID  . levothyroxine  137 mcg Oral QAC breakfast  . traZODone  50 mg Oral QHS    Dialysis Orders:  MWF - [facility]  3hrs 86min, BFR 400, DFR 600,  EDW59, 2K/ 2Ca  Access: TDC, LU AVF   Heparin none Hectorol 81mcg IV qHD   Assessment/Plan: 1. L arm hematoma s/p 2nd stage BVT  fistula - s/p evacuation 5/20 by Dr. Donnetta Hutching.   2. Dialysis dependent AKI -  On HD MWF.  K 3.7, BUN 35, SCr 5.87 pre HD 5/21. No signs of respiratory distress or volume overload.  Had HD off schedule on Sat d/t high inpatient census.   Can resume regular schedule on 09/27/20 at outpatient center.   3. Hypertension/volume  - BP controlled  On low dose Coreg. Midodrine with HD  for BP support.  Follow weights.  4. Anemia of CKD - Hgb 8.9  Tsat from 5/18 30%.  Will order  ESA with HD today  5. Secondary Hyperparathyroidism -  Ca in goal. Check phos. Continue binders/VDRA  6. Nutrition - Renal diet w/fluid restrictions 7. Combined diastolic/systolic HF - appears euvolemic 8. A fib - On amiodarone -- per primary   Lynnda Child PA-C [facility] 09/26/2020,10:06 AM

## 2020-09-26 NOTE — TOC Initial Note (Signed)
Transition of Care [facility]) - Initial/Assessment Note    Patient Details  Name: Morgan Clay MRN: [id number] Date of Birth: 1945/07/03  Transition of Care [facility]) CM/SW Contact:    Bartholomew Crews, RN Phone Number: [phone] 09/26/2020, 12:16 PM  Clinical Narrative:                  Spoke with patient at the bedside. PTA home with daughter. Stated her daughter is out of town, but will be home tomorrow. She has a friend who will pick her up and stay with her overnight.   She attends dialysis MWF and normally drives herself, but has family who can assist her with transportation at this time.  She stated that she has an aide from [facility] who provides care on Tuesdays and Thursdays.   Discussed recommendations for home health - she stated that she is not one to stay in the bed and enjoys getting out and about and going for her walks. She is agreeable to [facility]. Discussed choice of agency and search for accepting in-network agency. Referral pending at this time.   Patient stated that her daughter assists with changing the dressing to her arm, and will be able to continue to do this.   TOC following for transition needs.  Expected Discharge Plan: [place] Barriers to Discharge: No Barriers Identified   Patient Goals and CMS Choice Patient states their goals for this hospitalization and ongoing recovery are:: return home with family/friend support CMS Medicare.gov Compare Post Acute Care list provided to:: Patient Choice offered to / list presented to : Patient  Expected Discharge Plan and Services Expected Discharge Plan: [place] In-house Referral: NA Discharge Planning Services: CM Consult Post Acute Care Choice: [place] arrangements for the past 2 months: Single Family Home Expected Discharge Date: 09/26/20               DME Arranged: N/A DME Agency: NA       HH Arranged: RN,PT,OT          Prior Living  Arrangements/Services Living arrangements for the past 2 months: Single Family Home Lives with:: Self,Adult Children Patient language and need for interpreter reviewed:: Yes Do you feel safe going back to the place where you live?: Yes      Need for Family Participation in Patient Care: Yes (Comment) Care giver support system in place?: Yes (comment) Current home services: DME,Homehealth aide Criminal Activity/Legal Involvement Pertinent to Current Situation/Hospitalization: No - Comment as needed  Activities of Daily Living      Permission Sought/Granted                  Emotional Assessment Appearance:: Appears stated age Attitude/Demeanor/Rapport: Engaged Affect (typically observed): Accepting Orientation: : Oriented to Self,Oriented to Place,Oriented to  Time,Oriented to Situation Alcohol / Substance Use: Not Applicable Psych Involvement: No (comment)  Admission diagnosis:  Hematoma [T14.8XXA] Post-op pain [G89.18] 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) [T82.838A] Bleeding pseudoaneurysm of left brachiocephalic AV fistula ([place]) [R91.638G] Patient Active Problem List   Diagnosis Date Noted  . Bleeding pseudoaneurysm of left brachiocephalic arteriovenous fistula ([place]) 09/24/2020  . Acute blood loss anemia 09/24/2020  . Bleeding pseudoaneurysm of left brachiocephalic AV fistula ([place]) 09/24/2020  . Malnutrition of moderate degree 07/09/2020  . Angiodysplasia of small intestine ([place]) 06/10/2020  . Acute on chronic diastolic CHF (congestive heart failure) ([place])   . COVID-19 virus infection 05/27/2020  . Anemia due to  chronic blood loss 04/15/2020  . Overweight (BMI 25.0-29.9) 03/02/2020  . Long term current use of anticoagulant   . Persistent atrial fibrillation ([place]) 02/06/2020  . Secondary hypercoagulable state ([place]) 02/06/2020  . CHF (congestive heart failure) ([place]) 07/23/2019  . 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  (HCV) ([place])   . Gout   . Anemia in chronic kidney disease 01/18/2018  . Pulmonary hypertension ([place]) on echocardiogram 01/14/2018  . AVM (arteriovenous malformation) of small bowel, acquired 01/14/2018  . Atrial fibrillation, chronic 10/25/2017  . Aortic atherosclerosis ([place]) 08/30/2017  . Diverticulitis of left colon status post robotic low anterior to sigmoid resection 05/22/2018 07/31/2017  . Hypertension associated with diabetes ([place]) 06/07/2017  . MDD (major depressive disorder), recurrent, in partial remission ([place]) 06/07/2017  . Asthma 05/22/2017  . Chronic obstructive pulmonary disease ([place])   . Atypical atrial flutter ([place]) 09/28/2015  . Type 2 diabetes, controlled, with renal manifestation ([place]) 11/24/2013  . CKD (chronic kidney disease) stage 4, GFR 15-29 ml/min  11/24/2013  . Nonischemic cardiomyopathy ([place]) 11/22/2010  . Obesity 05/27/2009  . OSTEOPENIA 09/30/2008  . FIBROIDS, UTERUS 03/05/2008  . OBSTRUCTIVE SLEEP APNEA 11/14/2007  . Chronic diastolic CHF (congestive heart failure) ([place]) 04/24/2007  . Hypothyroidism 01/28/2007  . Biventricular ICD (implantable cardioverter-defibrillator) in place 01/08/2007   PCP:  Billie Ruddy, MD Pharmacy:   [place], [place] [address] Phone: [phone] Fax: [phone]     Social Determinants of Health (SDOH) Interventions    Readmission Risk Interventions Readmission Risk Prevention Plan 06/29/2020  Transportation Screening Complete  PCP or Specialist Appt within 3-5 Days (No Data)  [place] or [place] Complete  Social Work Consult for [place] Planning/Counseling Complete  Palliative Care Screening Not Applicable  Medication Review Press photographer) Complete  Some recent data might be hidden

## 2020-09-26 NOTE — Evaluation (Signed)
Physical Therapy Evaluation Patient Details Name: Morgan Clay MRN: [id number] DOB: 02-22-1946 Today's Date: 09/26/2020   History of Present Illness  Morgan Clay is a 75 y.o. female who presented to the ED 5/19 due to bleeding from LUE AVF; underwent evacuation of left upper arm hematoma 3/20; has been with with dialysis dependent AKI since 07/06/2020, on HD MWF;  Past medical history significant for A fib, NICM combined diastolic/systolic HF s/p AICD, cirrhosis, HTN, recurrent GIB, DMT2 and hypothyroidism.  Clinical Impression   Pt admitted with above diagnosis. Lives at home with her daughter, who can provide assist as needed (including transportation for HD); Independent at baseline; Presents to PT with LUE pain limiting functional mobility; I anticipate good progress and return to independence once pain is under control; Pt currently with functional limitations due to the deficits listed below (see PT Problem List). Pt will benefit from skilled PT to increase their independence and safety with mobility to allow discharge to the venue listed below.       Follow Up Recommendations Home health PT;Other (comment) (HHOT can also be helpful for ADLs)    Equipment Recommendations  None recommended by PT    Recommendations for Other Services OT consult (Ordered per protocol)     Precautions / Restrictions Precautions Precautions: None Restrictions Weight Bearing Restrictions: No      Mobility  Bed Mobility Overal bed mobility: Modified Independent             General bed mobility comments: Moved as needed in teh bed for wrapping L upper arm    Transfers                 General transfer comment: Declined  Ambulation/Gait             General Gait Details: Declined  Stairs            Wheelchair Mobility    Modified Rankin (Stroke Patients Only)       Balance                                             Pertinent  Vitals/Pain      Home Living Family/patient expects to be discharged to:: Private residence Living Arrangements: Children Available Help at Discharge: Family;Available PRN/intermittently Type of Home: House Home Access: Level entry     Home Layout: Two level;Able to live on main level with bedroom/bathroom Home Equipment: [name] - 2 wheels;Walker - 4 wheels      Prior Function Level of Independence: Independent               Hand Dominance   Dominant Hand: Right    Extremity/Trunk Assessment   Upper Extremity Assessment Upper Extremity Assessment: LUE deficits/detail LUE Deficits / Details: Noted surgical incision upper L arm spanning roughly from axilla to close to elbow; very apinful with any movement; noted elbow ROM WFL; pt declined any movement of her shoulder due to pain; Requested for her arm to be re-wrapped, which I assisted her with -- used Kerlix an dace wrap LUE: Unable to fully assess due to pain            Communication   Communication: No difficulties  Cognition Arousal/Alertness: Awake/alert Behavior During Therapy: WFL for tasks assessed/performed;Restless;Anxious Overall Cognitive Status: Within Functional Limits for tasks assessed  General Comments: Became more anxious as pain in her arm increased      General Comments      Exercises     Assessment/Plan    PT Assessment Patient needs continued PT services  PT Problem List Decreased range of motion;Decreased activity tolerance;Decreased knowledge of use of DME;Decreased safety awareness;Decreased knowledge of precautions;Decreased skin integrity;Pain       PT Treatment Interventions Gait training;Stair training;Functional mobility training;Therapeutic activities;Therapeutic exercise;Balance training;Patient/family education    PT Goals (Current goals can be found in the Care Plan section)  Acute Rehab PT Goals Patient Stated Goal: decr LUE  pain PT Goal Formulation: Patient unable to participate in goal setting Time For Goal Achievement: 10/03/20 Potential to Achieve Goals: Good    Frequency Min 3X/week   Barriers to discharge        Co-evaluation               AM-PAC PT "6 Clicks" Mobility  Outcome Measure Help needed turning from your back to your side while in a flat bed without using bedrails?: A Little Help needed moving from lying on your back to sitting on the side of a flat bed without using bedrails?: A Little Help needed moving to and from a bed to a chair (including a wheelchair)?: None Help needed standing up from a chair using your arms (e.g., wheelchair or bedside chair)?: None Help needed to walk in hospital room?: None Help needed climbing 3-5 steps with a railing? : A Little 6 Click Score: 21    End of Session   Activity Tolerance: Patient limited by pain Patient left: in bed;with call bell/phone within reach Nurse Communication: Mobility status;Patient requests pain meds PT Visit Diagnosis: Other abnormalities of gait and mobility (R26.89);Pain Pain - Right/Left: Left Pain - part of body: Arm    Time: 0258-5277 PT Time Calculation (min) (ACUTE ONLY): 22 min   Charges:   PT Evaluation $PT Eval Low Complexity: [place], PT  Acute Rehabilitation Services Pager [phone] Office [phone]   [place] 09/26/2020, 2:54 PM

## 2020-09-26 NOTE — Discharge Summary (Signed)
Physician Discharge Summary  Drinda Belgard TGY:[id number] DOB: January 22, 1946 DOA: 09/23/2020  PCP: Billie Ruddy, MD  Admit date: 09/23/2020 Discharge date: 09/26/2020  Admitted From: Home Disposition:  Home  Recommendations for Outpatient Follow-up:  1. Follow up with PCP in 1-2 weeks 2. Please obtain BMP/CBC in one week 3. Please follow up with vascular surgery as an outpatient  Home Health:YES Equipment/Devices: No  Discharge Condition:Stable CODE STATUS:FULL Diet recommendation: Rnal  / Carb Modified   Brief/Interim Summary:  Katrice Goel a 75 y.o.femalewithhistory of ESRD on hemodialysis Monday Wednesday Friday had procedure for her left AV fistula last week, and is to ED with complaints as the AV fistula site, she was seen by surgery, status postevacuation 5/28 by Dr. Donnetta Hutching.    Bleeding left AV fistula -s/p evacuation today by Dr. Donnetta Hutching -She had some expected postoperative pain, this has to be significantly improved today, she will be discharged on p.o. pain medicine, with wound care, and to follow with vascular surgery as an outpatient . - keep to dry Kerlix over the incisions and then wrap the upper arm with a 4 inch Ace per general surgery.  Hypertension - On Coreg and BiDil, her blood pressure usually soft, sometimes requiring midodrine for hemodialysis days.  History of nonischemic cardiomyopathy  - fluid managed per dialysis. -On BiDil and beta-blockers  ESRD  -Dialysis per renal   COPD  - not actively wheezing.  History of A. fib not on anticoagulation secondary to recurrent GI bleed. On Coreg and amiodarone.  Hypothyroidism on Synthroid.  Acute blood loss anemia  Thrombocytopenia  -This is mild, platelet count is 1 54,000 on discharge  History of cirrhosis of the liver.  Diabetes mellitus is mentionedin the chart patient is not on any medication. 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    Activity: As tolerated with Full fall precautions use walker/cane & assistance as needed   Disposition Home    Diet: renal diet/carb modified, with feeding assistance and aspiration precautions.  For Heart failure patients - Check your Weight same time everyday, if you gain over 2 pounds, or you develop in leg swelling, experience more shortness of breath or chest pain, call your Primary MD immediately. Follow Cardiac Low Salt Diet and 1.5 lit/day fluid restriction.   On your next visit with your primary care physician please Get Medicines reviewed and adjusted.   Please request your Prim.MD to go over all Hospital Tests and Procedure/Radiological results at the follow up, please get all Hospital records sent to your Prim MD by signing hospital release before you go home.   If you experience worsening of your admission symptoms, develop shortness of breath, life threatening emergency, suicidal or homicidal thoughts you must seek medical attention immediately by calling 911 or calling your MD immediately  if symptoms less severe.  You Must  read complete instructions/literature along with all the possible adverse reactions/side effects for all the Medicines you take and that have been prescribed to you. Take any new Medicines after you have completely understood  and accpet all the possible adverse reactions/side effects.   Do not drive, operating heavy machinery, perform activities at heights, swimming or participation in water activities or provide baby sitting services if your were admitted for syncope or siezures until you have seen by Primary MD or a Neurologist and advised to do so again.  Do not drive when taking Pain medications.    Do not take more than prescribed Pain, Sleep and Anxiety Medications  Special Instructions: If you have smoked or chewed Tobacco  in the last 2 yrs please stop smoking, stop any regular Alcohol  and or any Recreational drug use.  Wear Seat belts while driving.   Please note  You were cared for by a hospitalist during your hospital stay. If you have any questions about your discharge medications or the care you received while you were in the hospital after you are discharged, you can call the unit and asked to speak with the hospitalist on call if the hospitalist that took care of you is not available. Once you are discharged, your primary care physician will handle any further medical issues. Please note that NO REFILLS for any discharge medications will be authorized once you are discharged, as it is imperative that you return to your primary care physician (or establish a relationship with a primary care physician if you do not have one) for your aftercare needs so that they can reassess your need for medications and monitor your lab values.   Discharge wound care:   Complete by: As directed    keep to dry Kerlix over the incisions and then wrap the upper arm with a 4 inch Ace , change daily   Increase activity slowly   Complete by: As directed      Allergies as of 09/26/2020   No Known Allergies     Medication List    STOP taking these medications   acetaminophen 650 MG CR tablet Commonly known as: TYLENOL     TAKE these medications   amiodarone 200 MG tablet Commonly known as: PACERONE Take 1  tablet (200 mg total) by mouth daily.   Auryxia 1 GM 210 MG(Fe) tablet Generic drug: ferric citrate Take 420 mg by mouth 3 (three) times daily with meals.   carvedilol 3.125 MG tablet Commonly known as: COREG Take 1 tablet (3.125 mg total) by mouth 2 (two) times daily with a meal.   cromolyn 4 % ophthalmic solution Commonly known as: OPTICROM Place 1 drop into both eyes 2 (two) times daily.   gabapentin 300 MG capsule Commonly known as: NEURONTIN Take 1 capsule (300 mg total) by mouth daily.   isosorbide-hydrALAZINE 20-37.5 MG tablet Commonly known as: BIDIL Take 0.5 tablets by mouth 3 (three) times daily.   levothyroxine 137 MCG tablet Commonly known as: SYNTHROID Take 1 tablet (137 mcg total) by mouth daily before breakfast.   methocarbamol 500 MG tablet Commonly known as: Robaxin Take 1 tablet (500 mg total) by mouth in the morning and at bedtime.   oxyCODONE-acetaminophen 10-325 MG tablet Commonly known as: Percocet Take 1 tablet by mouth every 6 (six) hours as needed for pain.   traZODone 50 MG tablet Commonly known as: DESYREL Take 50 mg by mouth at bedtime.  Discharge Care Instructions  (From admission, onward)         Start     Ordered   09/26/20 0000  Discharge wound care:       Comments: keep to dry Kerlix over the incisions and then wrap the upper arm with a 4 inch Ace , change daily   09/26/20 1125          Follow-up Information    Cherre Robins, MD.   Specialties: Vascular Surgery, Interventional Cardiology Why: office will call you to arrange your appt (sent) Contact information: [place] [address] [phone]        Cherre Robins, MD.   Specialties: Vascular Surgery, Interventional Cardiology Why: Keep previously arranged appoinment Contact information: [place] [address] [phone]              No Known Allergies  Consultations:  Vascular  surgery  Renal   Procedures/Studies: [facility] Chest Port 1 View  Result Date: 09/25/2020 CLINICAL DATA:  Possible aspiration of tooth after intubation. EXAM: PORTABLE CHEST 1 VIEW COMPARISON:  June 30, 2020. FINDINGS: Stable cardiomediastinal silhouette. Left-sided pacemaker is unchanged in position. Interval placement of right-sided dialysis catheter with tip in right atrium. No pneumothorax or pleural effusion is noted. Mild bibasilar subsegmental atelectasis or edema is noted. No other radiopaque foreign body is noted. Bony thorax is unremarkable. IMPRESSION: Mild bibasilar subsegmental atelectasis or edema. Right internal jugular dialysis catheter is now noted. No other radiopaque foreign body is noted. Electronically Signed   By: Marijo Conception M.D.   On: 09/25/2020 15:36   VAS US DUPLEX DIALYSIS ACCESS (AVF,AVG)  Result Date: 09/09/2020 DIALYSIS ACCESS Patient Name:  Morgan Clay  Date of Exam:   09/09/2020 Medical Rec #: [id number]          Accession #:    [id number] Date of Birth: 1945/11/27          Patient Gender: F Patient Age:   74Y Exam Location:  [facility] Procedure:      VAS US DUPLEX DIALYSIS ACCESS (AVF, AVG) Referring Phys: [id number] Cherre Robins --------------------------------------------------------------------------------  Reason for Exam: Routine follow up. Access Site: Left Upper Extremity. Access Type: Brachiobasilic AVF placed 1/60/7371 . Performing Technologist: Alvia Grove RVT  Examination Guidelines: A complete evaluation includes B-mode imaging, spectral Doppler, color Doppler, and power Doppler as needed of all accessible portions of each vessel. Unilateral testing is considered an integral part of a complete examination. Limited examinations for reoccurring indications may be performed as noted.  Findings: +--------------------+----------+-----------------+--------+ AVF                 PSV (cm/s)Flow Vol (mL/min)Comments  +--------------------+----------+-----------------+--------+ Native artery inflow   207           620                +--------------------+----------+-----------------+--------+ AVF Anastomosis        343                              +--------------------+----------+-----------------+--------+  +------------+----------+-------------+-----------+--------+ OUTFLOW VEINPSV (cm/s)Diameter (cm)Depth (cm) Describe +------------+----------+-------------+-----------+--------+ Prox UA         80        0.67        0.82             +------------+----------+-------------+-----------+--------+ Mid UA         179  0.65        1.50             +------------+----------+-------------+-----------+--------+ Dist UA        159        0.52        1.00             +------------+----------+-------------+-----------+--------+ AC Fossa    348 / 277  0.33 / 0.76 1.10 / 0.17         +------------+----------+-------------+-----------+--------+   Summary: Patent arteriovenous fistula with no visualized stenosis. *See table(s) above for measurements and observations.  Diagnosing physician: Ruta Hinds MD Electronically signed by Ruta Hinds MD on 09/09/2020 at 3:54:16 PM.    --------------------------------------------------------------------------------   Final    US Abdomen Limited RUQ (LIVER/GB)  Result Date: 08/31/2020 CLINICAL DATA:  History of cirrhosis EXAM: ULTRASOUND ABDOMEN LIMITED RIGHT UPPER QUADRANT COMPARISON:  June 30, 2020 FINDINGS: Gallbladder: No gallstones or wall thickening visualized. No sonographic Murphy sign noted by sonographer. Common bile duct: Diameter: 5.2 mm Liver: No focal lesion identified. Coarsened hepatic echotexture, consistent with given history cirrhosis. Parenchymal echogenicity. Portal vein is patent on color Doppler imaging with normal direction of blood flow towards the liver. Other: None. IMPRESSION: Coarsened hepatic echotexture, consistent  with given history of cirrhosis. No focal liver lesion identified. Electronically Signed   By: Dahlia Bailiff MD   On: 08/31/2020 23:07   (Echo, Carotid, EGD, Colonoscopy, ERCP)    Subjective:   Discharge Exam: Vitals:   09/25/20 2020 09/26/20 0557  BP: 112/61 (!) 102/39  Pulse: 74 62  Resp: 18 18  Temp: 98.4 F (36.9 C) 99.3 F (37.4 C)  SpO2: 100% 97%   Vitals:   09/25/20 1030 09/25/20 1805 09/25/20 2020 09/26/20 0557  BP: (!) 114/51 (!) 108/51 112/61 (!) 102/39  Pulse: 73 67 74 62  Resp: 18 18 18 18   Temp:  98.5 F (36.9 C) 98.4 F (36.9 C) 99.3 F (37.4 C)  TempSrc:  Oral Oral Oral  SpO2: 99% 100% 100% 97%  Weight: 60.2 kg     Height:        General: Pt is alert, awake, not in acute distress Cardiovascular: RRR, S1/S2 +, no rubs, no gallops Respiratory: CTA bilaterally, no wheezing, no rhonchi Abdominal: Soft, NT, ND, bowel sounds + Extremities: no edema, no cyanosis, left arm bandaged with Ace wrap.    The results of significant diagnostics from this hospitalization (including imaging, microbiology, ancillary and laboratory) are listed below for reference.     Microbiology: Recent Results (from the past 240 hour(s))  Resp Panel by RT-PCR (Flu A&B, Covid) Nasopharyngeal Swab     Status: None   Collection Time: 09/23/20 11:52 PM   Specimen: Nasopharyngeal Swab; Nasopharyngeal(NP) swabs in vial transport medium  Result Value Ref Range Status   SARS Coronavirus 2 by RT PCR NEGATIVE NEGATIVE Final    Comment: (NOTE) SARS-CoV-2 target nucleic acids are NOT DETECTED.  The SARS-CoV-2 RNA is generally detectable in upper respiratory specimens during the acute phase of infection. The lowest concentration of SARS-CoV-2 viral copies this assay can detect is 138 copies/mL. A negative result does not preclude SARS-Cov-2 infection and should not be used as the sole basis for treatment or other patient management decisions. A negative result may occur with  improper  specimen collection/handling, submission of specimen other than nasopharyngeal swab, presence of viral mutation(s) within the areas targeted by this assay, and inadequate number of viral copies(<138 copies/mL). A negative result  must be combined with clinical observations, patient history, and epidemiological information. The expected result is Negative.  Fact Sheet for Patients:  EntrepreneurPulse.com.au  Fact Sheet for Healthcare Providers:  IncredibleEmployment.be  This test is no t yet approved or cleared by the [place] FDA and  has been authorized for detection and/or diagnosis of SARS-CoV-2 by FDA under an Emergency Use Authorization (EUA). This EUA will remain  in effect (meaning this test can be used) for the duration of the COVID-19 declaration under Section 564(b)(1) of the Act, 21 U.S.C.section 360bbb-3(b)(1), unless the authorization is terminated  or revoked sooner.       Influenza A by PCR NEGATIVE NEGATIVE Final   Influenza B by PCR NEGATIVE NEGATIVE Final    Comment: (NOTE) The Xpert Xpress SARS-CoV-2/FLU/RSV plus assay is intended as an aid in the diagnosis of influenza from Nasopharyngeal swab specimens and should not be used as a sole basis for treatment. Nasal washings and aspirates are unacceptable for Xpert Xpress SARS-CoV-2/FLU/RSV testing.  Fact Sheet for Patients: EntrepreneurPulse.com.au  Fact Sheet for Healthcare Providers: IncredibleEmployment.be  This test is not yet approved or cleared by the [place] FDA and has been authorized for detection and/or diagnosis of SARS-CoV-2 by FDA under an Emergency Use Authorization (EUA). This EUA will remain in effect (meaning this test can be used) for the duration of the COVID-19 declaration under Section 564(b)(1) of the Act, 21 U.S.C. section 360bbb-3(b)(1), unless the authorization is terminated or revoked.  Performed at  [facility], [place] [address] [place], [place] [address]      Labs: BNP (last 3 results) Recent Labs    06/18/20 1300 06/21/20 1501 06/23/20 1224  BNP 1,147.8* 981.7* 3,267.1*   Basic Metabolic Panel: Recent Labs  Lab 09/23/20 2329 09/25/20 0017  NA 132* 131*  K 4.2 3.7  CL 91* 93*  CO2 27 25  GLUCOSE 158* 89  BUN 25* 35*  CREATININE 5.48* 5.87*  CALCIUM 9.6 8.9   Liver Function Tests: No results for input(s): AST, ALT, ALKPHOS, BILITOT, PROT, ALBUMIN in the last 168 hours. No results for input(s): LIPASE, AMYLASE in the last 168 hours. No results for input(s): AMMONIA in the last 168 hours. CBC: Recent Labs  Lab 09/23/20 2329 09/24/20 0429 09/24/20 1233 09/25/20 0017  WBC 6.3 7.6 6.9 6.6  NEUTROABS 3.7  --   --   --   HGB 9.7* 9.5* 9.7* 8.9*  HCT 30.1* 29.4* 30.3* 27.5*  MCV 92.3 92.5 92.9 92.0  PLT 149* 142* 129* 154   Cardiac Enzymes: No results for input(s): CKTOTAL, CKMB, CKMBINDEX, TROPONINI in the last 168 hours. BNP: Invalid input(s): POCBNP CBG: Recent Labs  Lab 09/24/20 2343 09/25/20 0612 09/25/20 1801 09/25/20 2354 09/26/20 0555  GLUCAP 100* 105* 136* 137* 105*   D-Dimer No results for input(s): DDIMER in the last 72 hours. Hgb A1c No results for input(s): HGBA1C in the last 72 hours. Lipid Profile No results for input(s): CHOL, HDL, LDLCALC, TRIG, CHOLHDL, LDLDIRECT in the last 72 hours. Thyroid function studies No results for input(s): TSH, T4TOTAL, T3FREE, THYROIDAB in the last 72 hours.  Invalid input(s): FREET3 Anemia work up No results for input(s): VITAMINB12, FOLATE, FERRITIN, TIBC, IRON, RETICCTPCT in the last 72 hours. Urinalysis    Component Value Date/Time   COLORURINE YELLOW 03/05/2018 0902   APPEARANCEUR CLEAR 03/05/2018 0902   LABSPEC 1.008 03/05/2018 0902   PHURINE 5.0 03/05/2018 0902   GLUCOSEU NEGATIVE 03/05/2018 0902   HGBUR NEGATIVE 03/05/2018  0902   HGBUR negative 02/28/2008 0000   BILIRUBINUR  NEGATIVE 03/05/2018 0902   KETONESUR 5 (A) 03/05/2018 0902   PROTEINUR NEGATIVE 03/05/2018 0902   UROBILINOGEN 1.0 07/19/2010 1752   NITRITE NEGATIVE 03/05/2018 0902   LEUKOCYTESUR NEGATIVE 03/05/2018 0902   Sepsis Labs Invalid input(s): PROCALCITONIN,  WBC,  LACTICIDVEN Microbiology Recent Results (from the past 240 hour(s))  Resp Panel by RT-PCR (Flu A&B, Covid) Nasopharyngeal Swab     Status: None   Collection Time: 09/23/20 11:52 PM   Specimen: Nasopharyngeal Swab; Nasopharyngeal(NP) swabs in vial transport medium  Result Value Ref Range Status   SARS Coronavirus 2 by RT PCR NEGATIVE NEGATIVE Final    Comment: (NOTE) SARS-CoV-2 target nucleic acids are NOT DETECTED.  The SARS-CoV-2 RNA is generally detectable in upper respiratory specimens during the acute phase of infection. The lowest concentration of SARS-CoV-2 viral copies this assay can detect is 138 copies/mL. A negative result does not preclude SARS-Cov-2 infection and should not be used as the sole basis for treatment or other patient management decisions. A negative result may occur with  improper specimen collection/handling, submission of specimen other than nasopharyngeal swab, presence of viral mutation(s) within the areas targeted by this assay, and inadequate number of viral copies(<138 copies/mL). A negative result must be combined with clinical observations, patient history, and epidemiological information. The expected result is Negative.  Fact Sheet for Patients:  EntrepreneurPulse.com.au  Fact Sheet for Healthcare Providers:  IncredibleEmployment.be  This test is no t yet approved or cleared by the [place] FDA and  has been authorized for detection and/or diagnosis of SARS-CoV-2 by FDA under an Emergency Use Authorization (EUA). This EUA will remain  in effect (meaning this test can be used) for the duration of the COVID-19 declaration under Section 564(b)(1)  of the Act, 21 U.S.C.section 360bbb-3(b)(1), unless the authorization is terminated  or revoked sooner.       Influenza A by PCR NEGATIVE NEGATIVE Final   Influenza B by PCR NEGATIVE NEGATIVE Final    Comment: (NOTE) The Xpert Xpress SARS-CoV-2/FLU/RSV plus assay is intended as an aid in the diagnosis of influenza from Nasopharyngeal swab specimens and should not be used as a sole basis for treatment. Nasal washings and aspirates are unacceptable for Xpert Xpress SARS-CoV-2/FLU/RSV testing.  Fact Sheet for Patients: EntrepreneurPulse.com.au  Fact Sheet for Healthcare Providers: IncredibleEmployment.be  This test is not yet approved or cleared by the [place] FDA and has been authorized for detection and/or diagnosis of SARS-CoV-2 by FDA under an Emergency Use Authorization (EUA). This EUA will remain in effect (meaning this test can be used) for the duration of the COVID-19 declaration under Section 564(b)(1) of the Act, 21 U.S.C. section 360bbb-3(b)(1), unless the authorization is terminated or revoked.  Performed at [facility], [place] [address], [place], [place] [address]      Time coordinating discharge: Over 30 minutes  SIGNED:   Phillips Climes, MD  [facility] 09/26/2020, 11:31 AM Pager   If 7PM-7AM, please contact night-coverage [email or web address] Password [email or web address]

## 2020-09-27 ENCOUNTER — Telehealth: Payer: Self-pay | Admitting: Nephrology

## 2020-09-27 NOTE — Telephone Encounter (Signed)
Transition of Care Contact from [facility]  Date of Discharge: 09/26/20 Date of Contact: 09/27/20 Method of contact: phone - attempted  Attempted to contact patient to discuss transition of care from inpatient admission.  Patient did not answer the phone and voicemail is not set up yet.   Will attempt to call again and if unable to reach will follow up at dialysis.  Jen Mow, PA-C [name] Pager: [phone]

## 2020-09-29 ENCOUNTER — Telehealth: Payer: Self-pay | Admitting: *Deleted

## 2020-09-29 NOTE — Telephone Encounter (Signed)
Spoke with Velva Harman at [facility] about patients arm swelling and draining clear red tinged fluid. Pts dialysis access is working well. Moved up post op appt to next week.

## 2020-10-05 ENCOUNTER — Encounter: Payer: Self-pay | Admitting: Vascular Surgery

## 2020-10-05 ENCOUNTER — Other Ambulatory Visit: Payer: Self-pay

## 2020-10-05 ENCOUNTER — Ambulatory Visit (INDEPENDENT_AMBULATORY_CARE_PROVIDER_SITE_OTHER): Payer: Medicare Other | Admitting: Vascular Surgery

## 2020-10-05 VITALS — BP 118/57 | HR 65 | Temp 97.9°F | Resp 16 | Ht 59.0 in | Wt 134.0 lb

## 2020-10-05 DIAGNOSIS — Z992 Dependence on renal dialysis: Secondary | ICD-10-CM

## 2020-10-05 DIAGNOSIS — N186 End stage renal disease: Secondary | ICD-10-CM

## 2020-10-05 NOTE — Progress Notes (Signed)
[facility] [place]  ASSESSMENT / PLAN: 75 y.o. female with ESRD status post left second stage basilic vein transposition 09/16/2020.  She unfortunately developed a hematoma in the surgical bed 09/24/2020 which required evacuation.  No bleeding source was found.  She has had persistent discomfort in her left medial arm, likely from irritation of the medial antebrachial cutaneous nerve from the hematoma.  Recommended she continue local wound care.  I counseled her that I cannot prescribe her more narcotic because of [place] state law.  We will try to get her in to pain management as soon as possible.  She should follow-up with me in 1 month for wound check.  CHIEF COMPLAINT: Status post hematoma evacuation  SUBJECTIVE: 75 year old woman well-known to me for whom I created a left second stage basilic vein transposition 09/16/2020.  She unfortunately developed a hematoma which required evacuation 09/24/2020.  She reports continued pain in her medial arm after the surgery.  She is dialyzing without difficulty through her right IJ tunneled dialysis catheter.  She has no difficulty with her left hand.  OBJECTIVE: BP (!) 118/57 (BP Location: Right Arm, Patient Position: Sitting, Cuff Size: Normal)   Pulse 65   Temp 97.9 F (36.6 C)   Resp 16   Ht 4\' 11"  (1.499 m)   Wt 134 lb (60.8 kg)   SpO2 95%   BMI 27.06 kg/m   Right IJ TDC in place Left upper extremity basilic vein transposition with thrill Medial wound with mild serosanguineous drainage. Palpable radial pulse on the left Normal motor and sensory function of the left hand  CBC Latest Ref Rng & Units 09/25/2020 09/24/2020 09/24/2020  WBC 4.0 - 10.5 K/uL 6.6 6.9 7.6  Hemoglobin 12.0 - 15.0 g/dL 8.9(L) 9.7(L) 9.5(L)  Hematocrit 36.0 - 46.0 % 27.5(L) 30.3(L) 29.4(L)  Platelets 150 - 400 K/uL 154 129(L) 142(L)     CMP Latest Ref Rng & Units 09/25/2020 09/23/2020 09/16/2020  Glucose 70 - 99 mg/dL 89 158(H) 92  BUN 8 -  23 mg/dL 35(H) 25(H) 18  Creatinine 0.44 - 1.00 mg/dL 5.87(H) 5.48(H) 4.10(H)  Sodium 135 - 145 mmol/L 131(L) 132(L) 134(L)  Potassium 3.5 - 5.1 mmol/L 3.7 4.2 3.9  Chloride 98 - 111 mmol/L 93(L) 91(L) 91(L)  CO2 22 - 32 mmol/L 25 27 -  Calcium 8.9 - 10.3 mg/dL 8.9 9.6 -  Total Protein 6.5 - 8.1 g/dL - - -  Total Bilirubin 0.3 - 1.2 mg/dL - - -  Alkaline Phos 38 - 126 U/L - - -  AST 15 - 41 U/L - - -  ALT 0 - 44 U/L - - -    Estimated Creatinine Clearance: 6.9 mL/min (A) (by C-G formula based on SCr of 5.87 mg/dL (H)).  [name]. Stanford Breed, MD [facility] Phone Number: [phone] 10/05/2020 12:01 PM

## 2020-10-11 ENCOUNTER — Other Ambulatory Visit: Payer: Self-pay

## 2020-10-11 MED ORDER — ISOSORB DINITRATE-HYDRALAZINE 20-37.5 MG PO TABS: 0.5000 | ORAL_TABLET | Freq: Three times a day (TID) | ORAL | 5 refills | Status: DC

## 2020-10-11 NOTE — Telephone Encounter (Signed)
This is a CHF pt, Dr. Mclean 

## 2020-10-12 ENCOUNTER — Encounter: Payer: Medicare Other | Admitting: Vascular Surgery

## 2020-10-14 ENCOUNTER — Telehealth (INDEPENDENT_AMBULATORY_CARE_PROVIDER_SITE_OTHER): Payer: Medicare Other | Admitting: Cardiology

## 2020-10-14 ENCOUNTER — Encounter: Payer: Self-pay | Admitting: Cardiology

## 2020-10-14 ENCOUNTER — Other Ambulatory Visit: Payer: Self-pay

## 2020-10-14 VITALS — Ht 59.0 in

## 2020-10-14 DIAGNOSIS — Z95810 Presence of automatic (implantable) cardiac defibrillator: Secondary | ICD-10-CM

## 2020-10-14 DIAGNOSIS — K7460 Unspecified cirrhosis of liver: Secondary | ICD-10-CM | POA: Diagnosis not present

## 2020-10-14 DIAGNOSIS — I5042 Chronic combined systolic (congestive) and diastolic (congestive) heart failure: Secondary | ICD-10-CM | POA: Diagnosis not present

## 2020-10-14 DIAGNOSIS — D649 Anemia, unspecified: Secondary | ICD-10-CM | POA: Diagnosis not present

## 2020-10-14 DIAGNOSIS — I4819 Other persistent atrial fibrillation: Secondary | ICD-10-CM | POA: Diagnosis not present

## 2020-10-14 NOTE — Progress Notes (Signed)
Virtual Visit via Telephone Note   This visit type was conducted due to national recommendations for restrictions regarding the COVID-19 Pandemic (e.g. social distancing) in an effort to limit this patient's exposure and mitigate transmission in our community.  Due to her co-morbid illnesses, this patient is at least at moderate risk for complications without adequate follow up.  This format is felt to be most appropriate for this patient at this time.  The patient did not have access to video technology/had technical difficulties with video requiring transitioning to audio format only (telephone).  All issues noted in this document were discussed and addressed.  No physical exam could be performed with this format.  Please refer to the patient's chart for her  consent to telehealth for [facility]    Date:  10/14/2020   ID:  Morgan Clay, DOB [birth year], MRN [id number] The patient was identified using 2 identifiers.  Patient Location: Home Provider Location: Home Office   PCP:  Billie Ruddy, MD   [facility] Providers   Evaluation Performed:  New Patient Evaluation  Chief Complaint:  atrial fibrillation  History of Present Illness:    Morgan Clay is a 75 y.o. female with ESRD on HD, atrial fibrillation on eliquis, recurrent Gi bleeding secondary to AVM, biV ICD in situ, HCV, cirrhosis, obesity, OSA and HTN presents for a consultation for her atrial fibrillation at the request of Dr Caryl Comes.  Patient tells me that she has been started on dialysis since the end of February 2022.  She has a dialysis access in the right neck.  She is currently not taking an anticoagulant because of recurrent symptomatic GI bleeds.    Past Medical History:  Diagnosis Date   A-fib ([place])    on Eliquis   AICD (automatic cardioverter/defibrillator) present 10/06/2015   Anemia    Angiodysplasia of small intestine ([place]) 06/10/2020   Ileum - seen on capsule endoscopy 05/2020 - ablated at  colonoscopy   ARF (acute renal failure) ([place])    Arthritis    "qwhere" (01/03/2018)   Asthma    reports mild asthma since childhood - had COPD on dx list from prior PCP   Chronic bronchitis ([place])    "get it most years; not this past year though" (01/03/2018)   Chronic kidney disease    "? stage;  followed by [facility]; [name]" (01/03/2018)   Chronic systolic congestive heart failure ([place])    Cirrhosis ([place]) 09/14/2020   dx by Dr Silvano Rusk    COVID-19 05/03/2020   DEPRESSION 12/17/2009   Annotation: PHQ-9 score = 14 done on 12/17/2009 Qualifier: Diagnosis of  By: Hassell Done FNP, [place]     Diabetes mellitus without complication ([place])    DIET CONTROLLED    Diverticulosis    Enteritis    FIBROIDS, UTERUS 03/05/2008   GI bleeding 01/03/2018   Glaucoma    Gout    Hepatitis C    HEPATITIS C - s/p treatment with Harvoni, saw hepatology, Dawn Drazek   History of blood transfusion ~ 11/2017   Hyperlipemia 12/06/2012   HYPERTENSION, BENIGN 04/24/2007   Hypothyroidism    LBBB (left bundle branch block)    Lower GI bleeding    "been dealing w/it since 07/2017" (01/03/2018)   Nonischemic cardiomyopathy ([place])    OBESITY 05/27/2009   OBSTRUCTIVE SLEEP APNEA 11/14/2007   no CPAP   OSTEOPENIA 09/30/2008   Pain and swelling of left upper extremity 08/08/2017   Personal history of other infectious and  parasitic disease    Hepatitis B   Pneumonia    "several times" (01/03/2018)   Pulmonary edema, acute ([place]) 08/09/2017   Past Surgical History:  Procedure Laterality Date   APPENDECTOMY     AV FISTULA PLACEMENT Left 08/05/2020   Procedure: LEFT UPPER EXTREMITY ARTERIOVENOUS (AV) FISTULA CREATION;  Surgeon: Cherre Robins, MD;  Location: [place];  Service: Vascular;  Laterality: Left;   [place] Left 09/16/2020   Procedure: LEFT SECOND STAGE [place];  Surgeon: Cherre Robins, MD;  Location: [place];  Service: Vascular;  Laterality: Left;  PERIPHERAL NERVE  BLOCK   CARDIAC CATHETERIZATION     CARDIOVERSION N/A 12/14/2014   Procedure: CARDIOVERSION;  Surgeon: Dorothy Spark, MD;  Location: [place];  Service: Cardiovascular;  Laterality: N/A;   CARDIOVERSION N/A 02/26/2017   Procedure: CARDIOVERSION;  Surgeon: Evans Lance, MD;  Location: [place] CV LAB;  Service: Cardiovascular;  Laterality: N/A;   CARDIOVERSION N/A 07/24/2017   Procedure: CARDIOVERSION;  Surgeon: Acie Fredrickson Wonda Cheng, MD;  Location: [facility] ENDOSCOPY;  Service: Cardiovascular;  Laterality: N/A;   CARDIOVERSION N/A 02/12/2020   Procedure: CARDIOVERSION;  Surgeon: [name], MD;  Location: [facility] ENDOSCOPY;  Service: Cardiovascular;  Laterality: N/A;   COLONOSCOPY N/A 11/08/2017   Procedure: COLONOSCOPY;  Surgeon: Milus Banister, MD;  Location: [facility] ENDOSCOPY;  Service: Endoscopy;  Laterality: N/A;   COLONOSCOPY WITH PROPOFOL N/A 05/31/2020   Procedure: COLONOSCOPY WITH PROPOFOL;  Surgeon: Irene Shipper, MD;  Location: [facility] ENDOSCOPY;  Service: Endoscopy;  Laterality: N/A;   DILATION AND CURETTAGE OF UTERUS     ENTEROSCOPY N/A 03/02/2020   Procedure: ENTEROSCOPY;  Surgeon: Yetta Flock, MD;  Location: [facility] ENDOSCOPY;  Service: Gastroenterology;  Laterality: N/A;   EP IMPLANTABLE DEVICE N/A 10/06/2015   Procedure: BIV ICD Generator Changeout;  Surgeon: [name], MD;  Location: [place] CV LAB;  Service: Cardiovascular;  Laterality: N/A;   ESOPHAGOGASTRODUODENOSCOPY N/A 10/26/2017   Procedure: ESOPHAGOGASTRODUODENOSCOPY (EGD);  Surgeon: Ladene Artist, MD;  Location: [facility];  Service: Endoscopy;  Laterality: N/A;   ESOPHAGOGASTRODUODENOSCOPY (EGD) WITH PROPOFOL N/A 11/07/2017   Procedure: ESOPHAGOGASTRODUODENOSCOPY (EGD) WITH PROPOFOL;  Surgeon: Milus Banister, MD;  [facility]: [facility];  Service: Endoscopy;  Laterality: N/A;   ESOPHAGOGASTRODUODENOSCOPY (EGD) WITH PROPOFOL N/A 05/29/2020   Procedure: ESOPHAGOGASTRODUODENOSCOPY (EGD) WITH PROPOFOL;  Surgeon:  Doran Stabler, MD;  Location: [place];  Service: Gastroenterology;  Laterality: N/A;   GIVENS CAPSULE STUDY N/A 05/19/2020   Procedure: GIVENS CAPSULE STUDY;  Surgeon: [name], MD;  Location: [place];  Service: Endoscopy;  Laterality: N/A;  .adm for obs since pacemaker, PA wil enter order and see pt   GIVENS CAPSULE STUDY N/A 05/29/2020   Procedure: GIVENS CAPSULE STUDY;  Surgeon: Doran Stabler, MD;  Location: [place];  Service: Gastroenterology;  Laterality: N/A;   HEMATOMA EVACUATION Left 09/24/2020   Procedure: EVACUATION HEMATOMA ARM;  Surgeon: Rosetta Posner, MD;  Location: [facility] OR;  Service: Vascular;  Laterality: Left;   HOT HEMOSTASIS N/A 10/26/2017   Procedure: HOT HEMOSTASIS (ARGON PLASMA COAGULATION/BICAP);  Surgeon: Ladene Artist, MD;  Location: [facility];  Service: Endoscopy;  Laterality: N/A;   HOT HEMOSTASIS N/A 03/02/2020   Procedure: HOT HEMOSTASIS (ARGON PLASMA COAGULATION/BICAP);  Surgeon: Yetta Flock, MD;  Location: [facility] ENDOSCOPY;  Service: Gastroenterology;  Laterality: N/A;   HOT HEMOSTASIS N/A 05/31/2020   Procedure: HOT HEMOSTASIS (ARGON PLASMA COAGULATION/BICAP);  Surgeon: Irene Shipper, MD;  Location: [facility] ENDOSCOPY;  Service: Endoscopy;  Laterality: N/A;   INSERT / REPLACE / REMOVE PACEMAKER  ?2008   IR PERC TUN PERIT CATH WO PORT S&I /IMAG  07/05/2020   POLYPECTOMY  11/08/2017   Procedure: POLYPECTOMY;  Surgeon: Milus Banister, MD;  Location[facility];  Service: Endoscopy;;   PROCTOSCOPY N/A 05/22/2018   Procedure: RIGID PROCTOSCOPY;  Surgeon: Michael Boston, MD;  Location: [facility];  Service: General;  Laterality: N/A;   RIGHT HEART CATH N/A 02/13/2018   Procedure: RIGHT HEART CATH;  Surgeon: [name], MD;  Location: [place] CV LAB;  Service: Cardiovascular;  Laterality: N/A;   RIGHT HEART CATH N/A 06/23/2020   Procedure: RIGHT HEART CATH;  Surgeon: [name], MD;  Location: [place] CV LAB;  Service:  Cardiovascular;  Laterality: N/A;   RIGHT/LEFT HEART CATH AND CORONARY ANGIOGRAPHY N/A 12/11/2019   Procedure: RIGHT/LEFT HEART CATH AND CORONARY ANGIOGRAPHY;  Surgeon: [name], MD;  Location: [place] CV LAB;  Service: Cardiovascular;  Laterality: N/A;   SUBMUCOSAL TATTOO INJECTION  03/02/2020   Procedure: SUBMUCOSAL TATTOO INJECTION;  Surgeon: Yetta Flock, MD;  Location: [facility] ENDOSCOPY;  Service: Gastroenterology;;   SUBMUCOSAL TATTOO INJECTION  05/31/2020   Procedure: SUBMUCOSAL TATTOO INJECTION;  Surgeon: Irene Shipper, MD;  Location: [facility] ENDOSCOPY;  Service: Endoscopy;;   TUBAL LIGATION     XI ROBOTIC ASSISTED LOWER ANTERIOR RESECTION N/A 05/22/2018   Procedure: XI ROBOTIC ASSISTED SIGMOID COLOECTOMY MOBILIZATION OF SPLENIC FLEXURE, FIREFLY ASSESSMENT OF PERFUSION;  Surgeon: Michael Boston, MD;  Location: [facility];  Service: General;  Laterality: N/A;  ERAS PATHWAY     No outpatient medications have been marked as taking for the 10/14/20 encounter (Appointment) with Vickie Epley, MD.     Allergies:   Patient has no known allergies.   Social History   Tobacco Use   Smoking status: Never   Smokeless tobacco: Never  Vaping Use   Vaping Use: Never used  Substance Use Topics   Alcohol use: Not Currently    Comment: 01/03/2018 "couple drinks/month"   Drug use: Not Currently    Types: Marijuana    Comment: VERY INTERMITTENT      Family Hx: The patient's family history includes Asthma in her father and sister; Heart attack in her father and mother; Lung cancer in her sister; Stroke in her brother. There is no history of Colon cancer, Esophageal cancer, Pancreatic cancer, Stomach cancer, or Liver disease.  ROS:   Please see the history of present illness.     All other systems reviewed and are negative.   Prior CV studies:   The following studies were reviewed today:  June 24, 2020 echo  Left ventricular function normal, 55% Moderate LVH Right ventricular  function low normal Severely dilated left and right atrium Severe TR Mild MR Moderate AI  Labs/Other Tests and Data Reviewed:    Recent Labs: 06/23/2020: B Natriuretic Peptide 1,099.2 07/27/2020: Magnesium 2.4 09/07/2020: ALT 14; TSH 2.210 09/25/2020: BUN 35; Creatinine, Ser 5.87; Hemoglobin 8.9; Platelets 154; Potassium 3.7; Sodium 131   Recent Lipid Panel Lab Results  Component Value Date/Time   CHOL 89 07/23/2019 10:33 AM   TRIG 97.0 07/23/2019 10:33 AM   HDL 36.40 (L) 07/23/2019 10:33 AM   CHOLHDL 2 07/23/2019 10:33 AM   LDLCALC 33 07/23/2019 10:33 AM    Wt Readings from Last 3 Encounters:  10/05/20 134 lb (60.8 kg)  09/25/20  132 lb 11.5 oz (60.2 kg)  09/16/20 131 lb (59.4 kg)     Objective:    Vital Signs:  There were no vitals taken for this visit.     ASSESSMENT & PLAN:    Persistent atrial fibrillation Patient currently off of anticoagulation given history of recurrent symptomatic GI bleeding.  We discussed using the watchman device for stroke prophylaxis during today's visit.  Given the patient's multiple severe medical comorbidities including cirrhosis, end-stage renal disease on dialysis I do not think she is a good candidate for the watchman device.  There is data suggesting a significant increase in periprocedural mortality in patients receiving the watchman device who have end-stage renal disease.  I discussed this data with the patient during today's visit.  At this point, I think it is reasonable for her to continue off of anticoagulation given her history of recurrent severe GI bleeding while taking anticoagulation.  We discussed her increased risk of stroke off of anticoagulation during today's visit.  2.  Nonischemic cardiomyopathy Doing well with biventricular pacing.  Followed by Dr. [name] in the heart failure clinic.  3.  Cirrhosis, history of hepatitis C  4.  End-stage renal disease on dialysis Relatively recent diagnosis.  Because of her ESRD,  significant increase in periprocedural mortality.  Follow-up as needed.    Time:   Today, I have spent 15 minutes with the patient with telehealth technology discussing the above problems.     Medication Adjustments/Labs and Tests Ordered: Current medicines are reviewed at length with the patient today.  Concerns regarding medicines are outlined above.   Tests Ordered: No orders of the defined types were placed in this encounter.   Medication Changes: No orders of the defined types were placed in this encounter.   Signed, Vickie Epley, MD  10/14/2020 8:06 AM    [place]

## 2020-10-14 NOTE — Patient Instructions (Signed)
Medication Instructions:  Your physician recommends that you continue on your current medications as directed. Please refer to the Current Medication list given to you today. *If you need a refill on your cardiac medications before your next appointment, please call your pharmacy*  Lab Work: None ordered. If you have labs (blood work) drawn today and your tests are completely normal, you will receive your results only by: . MyChart Message (if you have MyChart) OR . A paper copy in the mail If you have any lab test that is abnormal or we need to change your treatment, we will call you to review the results.  Testing/Procedures: None ordered.  Follow-Up: At [facility], you and your health needs are our priority.  As part of our continuing mission to provide you with exceptional heart care, we have created designated Provider Care Teams.  These Care Teams include your primary Cardiologist (physician) and Advanced Practice Providers (APPs -  Physician Assistants and Nurse Practitioners) who all work together to provide you with the care you need, when you need it.  Your next appointment:   Your physician wants you to follow-up in: as needed with Dr. Lambert.     

## 2020-10-15 ENCOUNTER — Encounter: Payer: Self-pay | Admitting: Physical Medicine and Rehabilitation

## 2020-10-18 ENCOUNTER — Other Ambulatory Visit: Payer: Self-pay

## 2020-10-19 ENCOUNTER — Other Ambulatory Visit (HOSPITAL_COMMUNITY): Payer: Self-pay | Admitting: *Deleted

## 2020-10-19 ENCOUNTER — Ambulatory Visit (INDEPENDENT_AMBULATORY_CARE_PROVIDER_SITE_OTHER): Payer: Medicare Other | Admitting: Medical

## 2020-10-19 ENCOUNTER — Encounter (HOSPITAL_COMMUNITY): Payer: Medicare Other

## 2020-10-19 VITALS — BP 140/50 | HR 69 | Resp 18 | Ht 59.0 in | Wt 130.8 lb

## 2020-10-19 DIAGNOSIS — I4819 Other persistent atrial fibrillation: Secondary | ICD-10-CM | POA: Diagnosis not present

## 2020-10-19 DIAGNOSIS — R1310 Dysphagia, unspecified: Secondary | ICD-10-CM

## 2020-10-19 DIAGNOSIS — I152 Hypertension secondary to endocrine disorders: Secondary | ICD-10-CM

## 2020-10-19 DIAGNOSIS — I428 Other cardiomyopathies: Secondary | ICD-10-CM

## 2020-10-19 DIAGNOSIS — I5042 Chronic combined systolic (congestive) and diastolic (congestive) heart failure: Secondary | ICD-10-CM

## 2020-10-19 DIAGNOSIS — E039 Hypothyroidism, unspecified: Secondary | ICD-10-CM

## 2020-10-19 DIAGNOSIS — D500 Iron deficiency anemia secondary to blood loss (chronic): Secondary | ICD-10-CM

## 2020-10-19 DIAGNOSIS — E038 Other specified hypothyroidism: Secondary | ICD-10-CM

## 2020-10-19 DIAGNOSIS — N189 Chronic kidney disease, unspecified: Secondary | ICD-10-CM | POA: Diagnosis not present

## 2020-10-19 DIAGNOSIS — D631 Anemia in chronic kidney disease: Secondary | ICD-10-CM | POA: Diagnosis not present

## 2020-10-19 DIAGNOSIS — I5032 Chronic diastolic (congestive) heart failure: Secondary | ICD-10-CM

## 2020-10-19 DIAGNOSIS — E1159 Type 2 diabetes mellitus with other circulatory complications: Secondary | ICD-10-CM

## 2020-10-19 DIAGNOSIS — Z862 Personal history of diseases of the blood and blood-forming organs and certain disorders involving the immune mechanism: Secondary | ICD-10-CM

## 2020-10-19 DIAGNOSIS — R739 Hyperglycemia, unspecified: Secondary | ICD-10-CM | POA: Diagnosis not present

## 2020-10-19 DIAGNOSIS — M79602 Pain in left arm: Secondary | ICD-10-CM

## 2020-10-19 DIAGNOSIS — K5520 Angiodysplasia of colon without hemorrhage: Secondary | ICD-10-CM

## 2020-10-19 DIAGNOSIS — N184 Chronic kidney disease, stage 4 (severe): Secondary | ICD-10-CM

## 2020-10-19 MED ORDER — ISOSORB DINITRATE-HYDRALAZINE 20-37.5 MG PO TABS: 0.5000 | ORAL_TABLET | Freq: Three times a day (TID) | ORAL | 5 refills | Status: DC

## 2020-10-19 NOTE — Patient Instructions (Addendum)
Hx of atrial fibrillation and chf. Clinically stable. Continue on amiodorone, carvedilol and bidil.   For low thyroid will get tsh and t4. Make dose adjustment to levothyroxine if needed.  For ckd continue with dialysis.   For diabetes history will get a1c.  Hx of anemia and gi AVM. Will check cbc and iron level today.  For arm pain continue gabapentin same dose as other provider rx'd. Will refer to pain management. See if we can get quicker referral. Presently recommend adding on salon pas licodaine patch as I do think most of your pain over past 3 months nerve pain.  Follow up in one month or as needed

## 2020-10-19 NOTE — Progress Notes (Signed)
Subjective:    Patient ID: Morgan Clay, female    DOB: Aug 20, 1945, 75 y.o.   MRN: [id number]  HPI  Pt in for first time.   Hx of atrial fibrillation. Pt is on amiodorone. Pt cardiologist Dr. [name].  Last visit note.  "75 y.o. with history of nonischemic cardiomyopathy with recovery of EF with BiV pacing, recurrent GI bleeding, and paroxysmal atrial flutter was referred by Dr. Caryl Comes for evaluation of CHF.  Patient has a history of presumed nonischemic cardiomyopathy for a number of years.  Echo in 12/15 showed EF 25-30% and Cardiolite at that time showed no ischemia.  She had Medtronic CRT-D system placed, and EF recovered. In 4/19, echo showed EF 60-65%.     She has additionally had problems with GI bleeding.  She had been on Eliquis for atrial flutter but this was stopped with recurrent GI bleeding.  She had bleeding from a gastric ulcer but was also noted to have multiple small bowel AVMs on capsule endoscopy out of reach of endoscope.  Eliquis was stopped. She has had no melena or BRBPR off Eliquis.   Echo in 10/19 showed EF 55-60%, mild-moderate MR, moderate AI, moderate RV dilation.   She was admitted in 1/20 with diverticulitis and ended up having a sigmoid colectomy.     Echo in 9/20 showed EF lower at 45-50%, moderate AI.   She had LHC/RHC in 8/21 with nonobstructive CAD, normal filling pressures, and preserved cardiac output. Echo 8/21 showed EF 50-55%, mild LVH, mildly decreased RV systolic function, moderate aortic insufficiency.   Back in in A fib the end of October. Amio was increased and DC-CV was set up for November 19th.  Chemically converted so DC-CV canceled.     Seen in [facility] Clinic in 10/21 and was more fatigued. Found to be in atrial flutter but hgb also 6.9. She was on Eliquis at the time. Loaded on amio and converted to NSR. Patient underwent small bowel enteroscopy 03/02/2020 with findings trace esophageal varix, small superficial AVMs prepyloric stomach with  other punctate vascular lesions treated with APC, benign duodenal polyp.  Etiology likely secondary to jejunal AVM versus early GAVE vs portal gastropathy.Patient underwent small bowel enteroscopy 03/02/2020 with findings trace esophageal varix, small superficial AVMs prepyloric stomach with other punctate vascular lesions treated with APC, benign duodenal polyp.  Etiology likely secondary to jejunal AVM versus early GAVE vs portal gastropathy. Eliquis restarted.   Hgb dropped back down to 7.7 in 12/21. Saw [name] and capsule placed but got stuck in the stomach.   Had volume overload on device in 05/24/20 and torsemide increased by Dr. [name]   She was admitted on 05/27/20 with dizziness, and a fall preceded by melena found to have further GI bleeding hgb of 5.1 and an AKI.  Eliquis was held and she was transfused 2u PRBC's.  Repeat pill endoscope showed bleeding in distal small bowel.  Colonoscopy found no bleeding but large avm in cecum argon lasered.  She has been off anticoagulants.   Admitted 06/23/20 from [place] with markedly elevated filling pressures. Aggressively diuresed but had worsening renal function and progressed to ESRD. Nephrology consulted. She tolerated HD well and is on MWF schedule.  Echo in 2/22 showed EF 55-60%, low normal RV systolic function.   She returns for followup of CHF.  Weight is stable.  Breathing is better now that she is on dialysis.  No dyspnea walking on flat ground.  No orthopnea/PND.  No recent BRBPR/melena. No  orthopnea/PND.  She remains off anticoagulation due to recurrent GI bleeding.   Medtronic device interrogation: >99% BiV pacing, fluid index just over threshold but impedance trending up."    Hx of low thryoid. On levothyroxine 137 mcg daily.   Pt has been seeing Dr. Luan Pulling. Pt was getting pain med from Dr. Luan Pulling vascular surgery. Ever since she started dialysis and had shunt placed 3 month ago has had shunt placed. Pt was given oxycodone by  nephrologist.   She as appointment with pain management sept 30, 2022.  Pt is on also on gabapentin  300 mg daily.       Review of Systems  Constitutional:  Negative for chills, fatigue and fever.  HENT:  Negative for congestion, ear discharge and ear pain.   Cardiovascular:  Negative for chest pain and palpitations.  Gastrointestinal:  Negative for abdominal pain, constipation, nausea and vomiting.  Genitourinary:  Negative for dyspareunia, dysuria, enuresis and frequency.  Musculoskeletal:  Negative for arthralgias, joint swelling and neck pain.  Skin:  Negative for rash.  Neurological:  Negative for dizziness, seizures, syncope, weakness, light-headedness and headaches.  Hematological:  Negative for adenopathy. Does not bruise/bleed easily.  Psychiatric/Behavioral:  Negative for behavioral problems and confusion. The patient is not nervous/anxious.      Past Medical History:  Diagnosis Date   A-fib ([place])    on Eliquis   AICD (automatic cardioverter/defibrillator) present 10/06/2015   Anemia    Angiodysplasia of small intestine ([place]) 06/10/2020   Ileum - seen on capsule endoscopy 05/2020 - ablated at colonoscopy   ARF (acute renal failure) ([place])    Arthritis    "qwhere" (01/03/2018)   Asthma    reports mild asthma since childhood - had COPD on dx list from prior PCP   Chronic bronchitis ([place])    "get it most years; not this past year though" (01/03/2018)   Chronic kidney disease    "? stage;  followed by [facility]; [name]" (01/03/2018)   Chronic systolic congestive heart failure ([place])    Cirrhosis ([place]) 09/14/2020   dx by Dr Silvano Rusk    COVID-19 05/03/2020   DEPRESSION 12/17/2009   Annotation: PHQ-9 score = 14 done on 12/17/2009 Qualifier: Diagnosis of  By: Hassell Done FNP, [place]     Diabetes mellitus without complication ([place])    DIET CONTROLLED    Diverticulosis    Enteritis    FIBROIDS, UTERUS 03/05/2008   GI bleeding 01/03/2018   Glaucoma    Gout     Hepatitis C    HEPATITIS C - s/p treatment with Harvoni, saw hepatology, Dawn Drazek   History of blood transfusion ~ 11/2017   Hyperlipemia 12/06/2012   HYPERTENSION, BENIGN 04/24/2007   Hypothyroidism    LBBB (left bundle branch block)    Lower GI bleeding    "been dealing w/it since 07/2017" (01/03/2018)   Nonischemic cardiomyopathy ([place])    OBESITY 05/27/2009   OBSTRUCTIVE SLEEP APNEA 11/14/2007   no CPAP   OSTEOPENIA 09/30/2008   Pain and swelling of left upper extremity 08/08/2017   Personal history of other infectious and parasitic disease    Hepatitis B   Pneumonia    "several times" (01/03/2018)   Pulmonary edema, acute ([place]) 08/09/2017     Social History   Socioeconomic History   Marital status: Divorced    Spouse name: Not on file   Number of children: 1   Years of education: 12   Highest education level: Not on file  Occupational History   Not on file  Tobacco Use   Smoking status: Never   Smokeless tobacco: Never  Vaping Use   Vaping Use: Never used  Substance and Sexual Activity   Alcohol use: Not Currently    Comment: 01/03/2018 "couple drinks/month"   Drug use: Not Currently    Types: Marijuana    Comment: VERY INTERMITTENT    Sexual activity: Not Currently  Other Topics Concern   Not on file  Social History Narrative   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      Home Situation: lives alone      Spiritual Beliefs: Christian - Baptist      Lifestyle: no regular exercise; poor diet      She is divorced at least one adult child   Never smoker    rare alcohol to occasional at best    no substance abuse         Social Determinants of Radio broadcast assistant Strain: Low Risk    Difficulty of Paying Living Expenses: Not hard at all  Food Insecurity: No Food Insecurity   Worried About Charity fundraiser in the Last Year: Never true   [place] in the Last Year: Never true  Transportation Needs: No Transportation Needs   Lack of  Transportation (Medical): No   Lack of Transportation (Non-Medical): No  Physical Activity: Not on file  Stress: Not on file  Social Connections: Not on file  Intimate Partner Violence: Not on file    Past Surgical History:  Procedure Laterality Date   APPENDECTOMY     AV FISTULA PLACEMENT Left 08/05/2020   Procedure: LEFT UPPER EXTREMITY ARTERIOVENOUS (AV) FISTULA CREATION;  Surgeon: Cherre Robins, MD;  Location: [place];  Service: Vascular;  Laterality: Left;   [place] Left 09/16/2020   Procedure: LEFT SECOND STAGE [place];  Surgeon: Cherre Robins, MD;  Location: [place];  Service: Vascular;  Laterality: Left;  [place] N/A 12/14/2014   Procedure: CARDIOVERSION;  Surgeon: Dorothy Spark, MD;  Location: [place];  Service: Cardiovascular;  Laterality: N/A;   CARDIOVERSION N/A 02/26/2017   Procedure: CARDIOVERSION;  Surgeon: Evans Lance, MD;  Location: [place] CV LAB;  Service: Cardiovascular;  Laterality: N/A;   CARDIOVERSION N/A 07/24/2017   Procedure: CARDIOVERSION;  Surgeon: Acie Fredrickson Wonda Cheng, MD;  Location: [facility] ENDOSCOPY;  Service: Cardiovascular;  Laterality: N/A;   CARDIOVERSION N/A 02/12/2020   Procedure: CARDIOVERSION;  Surgeon: [name], MD;  Location: [facility] ENDOSCOPY;  Service: Cardiovascular;  Laterality: N/A;   COLONOSCOPY N/A 11/08/2017   Procedure: COLONOSCOPY;  Surgeon: Milus Banister, MD;  Location: [facility] ENDOSCOPY;  Service: Endoscopy;  Laterality: N/A;   COLONOSCOPY WITH PROPOFOL N/A 05/31/2020   Procedure: COLONOSCOPY WITH PROPOFOL;  Surgeon: Irene Shipper, MD;  Location: [facility] ENDOSCOPY;  Service: Endoscopy;  Laterality: N/A;   DILATION AND CURETTAGE OF UTERUS     ENTEROSCOPY N/A 03/02/2020   Procedure: ENTEROSCOPY;  Surgeon: Yetta Flock, MD;  Location: [facility] ENDOSCOPY;  Service: Gastroenterology;  Laterality: N/A;   EP IMPLANTABLE DEVICE N/A 10/06/2015    Procedure: BIV ICD Generator Changeout;  Surgeon: [name], MD;  Location: [place] CV LAB;  Service: Cardiovascular;  Laterality: N/A;   ESOPHAGOGASTRODUODENOSCOPY N/A 10/26/2017   Procedure: ESOPHAGOGASTRODUODENOSCOPY (EGD);  Surgeon: Ladene Artist, MD;  Location: [facility];  Service: Endoscopy;  Laterality: N/A;   ESOPHAGOGASTRODUODENOSCOPY (EGD) WITH PROPOFOL N/A 11/07/2017   Procedure: ESOPHAGOGASTRODUODENOSCOPY (EGD) WITH PROPOFOL;  Surgeon: Milus Banister, MD;  [facility] ENDOS[facility];  Service: Endoscopy;  Laterality: N/A;   ESOPHAGOGASTRODUODENOSCOPY (EGD) WITH PROPOFOL N/A 05/29/2020   Procedure: ESOPHAGOGASTRODUODENOSCOPY (EGD) WITH PROPOFOL;  Surgeon: Doran Stabler, MD;  Location: [place];  Service: Gastroenterology;  Laterality: N/A;   GIVENS CAPSULE STUDY N/A 05/19/2020   Procedure: GIVENS CAPSULE STUDY;  Surgeon: [name], MD;  Location: [place];  Service: Endoscopy;  Laterality: N/A;  .adm for obs since pacemaker, PA wil enter order and see pt   GIVENS CAPSULE STUDY N/A 05/29/2020   Procedure: GIVENS CAPSULE STUDY;  Surgeon: Doran Stabler, MD;  Location: [place];  Service: Gastroenterology;  Laterality: N/A;   HEMATOMA EVACUATION Left 09/24/2020   Procedure: EVACUATION HEMATOMA ARM;  Surgeon: Rosetta Posner, MD;  Location: [facility] OR;  Service: Vascular;  Laterality: Left;   HOT HEMOSTASIS N/A 10/26/2017   Procedure: HOT HEMOSTASIS (ARGON PLASMA COAGULATION/BICAP);  Surgeon: Ladene Artist, MD;  Location: [facility];  Service: Endoscopy;  Laterality: N/A;   HOT HEMOSTASIS N/A 03/02/2020   Procedure: HOT HEMOSTASIS (ARGON PLASMA COAGULATION/BICAP);  Surgeon: Yetta Flock, MD;  Location: [facility] ENDOSCOPY;  Service: Gastroenterology;  Laterality: N/A;   HOT HEMOSTASIS N/A 05/31/2020   Procedure: HOT HEMOSTASIS (ARGON PLASMA COAGULATION/BICAP);  Surgeon: Irene Shipper, MD;  Location: [facility] ENDOSCOPY;  Service: Endoscopy;  Laterality: N/A;   INSERT  / REPLACE / REMOVE PACEMAKER  ?2008   IR PERC TUN PERIT CATH WO PORT S&I /IMAG  07/05/2020   POLYPECTOMY  11/08/2017   Procedure: POLYPECTOMY;  Surgeon: Milus Banister, MD;  Location: [facility] ENDOSCOPY;  Service: Endoscopy;;   PROCTOSCOPY N/A 05/22/2018   Procedure: RIGID PROCTOSCOPY;  Surgeon: Michael Boston, MD;  Location: [facility] ORS;  Service: General;  Laterality: N/A;   RIGHT HEART CATH N/A 02/13/2018   Procedure: RIGHT HEART CATH;  Surgeon: [name], MD;  Location: [place] CV LAB;  Service: Cardiovascular;  Laterality: N/A;   RIGHT HEART CATH N/A 06/23/2020   Procedure: RIGHT HEART CATH;  Surgeon: [name], MD;  Location: [place] CV LAB;  Service: Cardiovascular;  Laterality: N/A;   RIGHT/LEFT HEART CATH AND CORONARY ANGIOGRAPHY N/A 12/11/2019   Procedure: RIGHT/LEFT HEART CATH AND CORONARY ANGIOGRAPHY;  Surgeon: [name], MD;  Location: [place] CV LAB;  Service: Cardiovascular;  Laterality: N/A;   SUBMUCOSAL TATTOO INJECTION  03/02/2020   Procedure: SUBMUCOSAL TATTOO INJECTION;  Surgeon: Yetta Flock, MD;  Location: [facility] ENDOSCOPY;  Service: Gastroenterology;;   SUBMUCOSAL TATTOO INJECTION  05/31/2020   Procedure: SUBMUCOSAL TATTOO INJECTION;  Surgeon: Irene Shipper, MD;  Location: [facility] ENDOSCOPY;  Service: Endoscopy;;   TUBAL LIGATION     XI ROBOTIC ASSISTED LOWER ANTERIOR RESECTION N/A 05/22/2018   Procedure: XI ROBOTIC ASSISTED SIGMOID COLOECTOMY MOBILIZATION OF SPLENIC FLEXURE, FIREFLY ASSESSMENT OF PERFUSION;  Surgeon: Michael Boston, MD;  Location: [facility];  Service: General;  Laterality: N/A;  ERAS PATHWAY    Family History  Problem Relation Age of Onset   Asthma Father    Heart attack Father    Asthma Sister    Lung cancer Sister    Heart attack Mother    Stroke Brother    Colon cancer Neg Hx    Esophageal cancer Neg Hx    Pancreatic cancer Neg Hx    Stomach cancer Neg Hx  Liver disease Neg Hx     No Known Allergies  Current Outpatient  Medications on File Prior to Visit  Medication Sig Dispense Refill   amiodarone (PACERONE) 200 MG tablet Take 1 tablet (200 mg total) by mouth daily. 90 tablet 3   AURYXIA 1 GM 210 MG(Fe) tablet Take 420 mg by mouth 3 (three) times daily with meals.     carvedilol (COREG) 3.125 MG tablet Take 1 tablet (3.125 mg total) by mouth 2 (two) times daily with a meal. 60 tablet 5   cromolyn (OPTICROM) 4 % ophthalmic solution Place 1 drop into both eyes 2 (two) times daily.     gabapentin (NEURONTIN) 300 MG capsule Take 1 capsule (300 mg total) by mouth daily. 180 capsule 2   levothyroxine (SYNTHROID) 137 MCG tablet Take 1 tablet (137 mcg total) by mouth daily before breakfast. 90 tablet 3   No current facility-administered medications on file prior to visit.    BP (!) 140/50   Pulse 69   Resp 18   Ht 4\' 11"  (1.499 m)   Wt 130 lb 12.8 oz (59.3 kg)   SpO2 99%   BMI 26.42 kg/m       Objective:   Physical Exam  General Mental Status- Alert. General Appearance- Not in acute distress.   Skin General: Color- Normal Color. Moisture- Normal Moisture.  Neck Carotid Arteries- Normal color. Moisture- Normal Moisture. No carotid bruits. No JVD.  Chest and Lung Exam Auscultation: Breath Sounds:-Normal.  Cardiovascular Auscultation:Rythm- Regular. Murmurs & Other Heart Sounds:Auscultation of the heart reveals- No Murmurs.  Abdomen Inspection:-Inspeection Normal. Palpation/Percussion:Note:No mass. Palpation and Percussion of the abdomen reveal- Non Tender, Non Distended + BS, no rebound or guarding.   Neurologic Cranial Nerve exam:- CN III-XII intact(No nystagmus), symmetric smile. Strength:- 5/5 equal and symmetric strength both upper and lower extremities.   Left upper extremity- shunt in place. Otherwise normal on inspection. No redness, no warmth. No induration. Not swollen. Tender to light touch dorsal aspect.    Assessment & Plan:  Hx of atrial fibrillation and chf. Clinically  stable. Continue on amiodorone, carvedilol and bidil.   For low thyroid will get tsh and t4. Make dose adjustment to levothyroxine if needed.  For ckd continue with dialysis.   For diabetes history will get a1c.  Hx of anemia and gi AVM. Will check cbc and iron level today.  For arm pain continue gabapentin. Will refer to pain management. See if we can get quicker referral.  Follow up in one month or as needed  Mackie Pai, PA-C   Time spent with  new patient today was 45 minutes which consisted of chart review, discussing diagnosis, work up ,treatment and documentation.   Discussed with pharmacist dosing of gabapentin. She is already on high dose rx'd by MD. So not increasing dose.

## 2020-10-20 ENCOUNTER — Telehealth: Payer: Self-pay | Admitting: *Deleted

## 2020-10-20 LAB — CBC WITH DIFFERENTIAL/PLATELET
Basophils Absolute: 0.1 10*3/uL (ref 0.0–0.1)
Basophils Relative: 1.7 % (ref 0.0–3.0)
Eosinophils Absolute: 0.4 10*3/uL (ref 0.0–0.7)
Eosinophils Relative: 6.2 % — ABNORMAL HIGH (ref 0.0–5.0)
HCT: 34.7 % — ABNORMAL LOW (ref 36.0–46.0)
Hemoglobin: 11.4 g/dL — ABNORMAL LOW (ref 12.0–15.0)
Lymphocytes Relative: 30.4 % (ref 12.0–46.0)
Lymphs Abs: 1.9 10*3/uL (ref 0.7–4.0)
MCHC: 32.8 g/dL (ref 30.0–36.0)
MCV: 94.5 fl (ref 78.0–100.0)
Monocytes Absolute: 0.6 10*3/uL (ref 0.1–1.0)
Monocytes Relative: 9.3 % (ref 3.0–12.0)
Neutro Abs: 3.3 10*3/uL (ref 1.4–7.7)
Neutrophils Relative %: 52.4 % (ref 43.0–77.0)
Platelets: 201 10*3/uL (ref 150.0–400.0)
RBC: 3.67 Mil/uL — ABNORMAL LOW (ref 3.87–5.11)
RDW: 15.9 % — ABNORMAL HIGH (ref 11.5–15.5)
WBC: 6.3 10*3/uL (ref 4.0–10.5)

## 2020-10-20 LAB — TSH: TSH: 46.87 u[IU]/mL — ABNORMAL HIGH (ref 0.35–4.50)

## 2020-10-20 LAB — HEMOGLOBIN A1C: Hgb A1c MFr Bld: 5.3 % (ref 4.6–6.5)

## 2020-10-20 LAB — IRON: Iron: 71 ug/dL (ref 42–145)

## 2020-10-20 NOTE — Telephone Encounter (Signed)
Call Type Triage / Clinical Relationship To Patient Self Return Phone Number [phone] (Primary) Chief Complaint Prescription Refill or Medication Request (non symptomatic) Reason for Call Symptomatic / Request for Health Information Initial Comment Caller states they wrote a rx and wants to know where he sent it. Translation No Nurse Assessment Nurse: Lovena Le, RN, Santiago Glad Date/Time Eilene Ghazi Time): 10/19/2020 9:58:21 PM Confirm and document reason for call. If symptomatic, describe symptoms. ---Caller states that she was seen today and was to have a rx for pain and does not know where it was called to. Her area pharmacy is closed at time of call and will call office in am.

## 2020-10-20 NOTE — Telephone Encounter (Signed)
Spoke with pt made her aware that [facility] called in Hydrazlaine and not [facility] and the medication is at her pharmacy

## 2020-10-21 ENCOUNTER — Other Ambulatory Visit (HOSPITAL_COMMUNITY): Payer: Self-pay

## 2020-10-21 DIAGNOSIS — I5042 Chronic combined systolic (congestive) and diastolic (congestive) heart failure: Secondary | ICD-10-CM

## 2020-10-21 MED ORDER — ISOSORB DINITRATE-HYDRALAZINE 20-37.5 MG PO TABS: 0.5000 | ORAL_TABLET | Freq: Three times a day (TID) | ORAL | 5 refills | Status: DC

## 2020-10-26 ENCOUNTER — Encounter (HOSPITAL_COMMUNITY): Payer: Medicare Other

## 2020-10-31 NOTE — Progress Notes (Signed)
[facility] [place]  ASSESSMENT / PLAN: 75 y.o. female with ESRD status post left second stage basilic vein transposition 09/16/2020.  She unfortunately developed a hematoma in the surgical bed 09/24/2020 which required evacuation.  No bleeding source was found.  She has had persistent discomfort in her left medial arm, likely from irritation of the medial antebrachial cutaneous nerve from the hematoma.  Recommended she continue local wound care.  I counseled her that I cannot prescribe her more narcotic because of [place] state law.  She should continue pain management. Will see her again in 3 months.  CHIEF COMPLAINT: Status post hematoma evacuation  SUBJECTIVE: 75 year old woman well-known to me for whom I created a left second stage basilic vein transposition 09/16/2020.  She unfortunately developed a hematoma which required evacuation 09/24/2020.  She reports continued pain in her medial arm after the surgery.  She is dialyzing without difficulty through her right IJ tunneled dialysis catheter.  She has no difficulty with her left hand.  11/02/20: continues to have pain in the left medial forearm. She has established care with pain management. No symptoms of steal syndrome in the left hand.  OBJECTIVE: BP 122/65 (BP Location: Right Arm, Patient Position: Sitting, Cuff Size: Normal)   Pulse 65   Temp 97.9 F (36.6 C)   Resp 20   Ht 4\' 11"  (1.499 m)   Wt 131 lb (59.4 kg)   SpO2 95%   BMI 26.46 kg/m   Right IJ TDC in place Left upper extremity basilic vein transposition with thrill Medial wound with mild serosanguineous drainage. Palpable radial pulse on the left Normal motor and sensory function of the left hand  CBC Latest Ref Rng & Units 10/19/2020 09/25/2020 09/24/2020  WBC 4.0 - 10.5 K/uL 6.3 6.6 6.9  Hemoglobin 12.0 - 15.0 g/dL 11.4(L) 8.9(L) 9.7(L)  Hematocrit 36.0 - 46.0 % 34.7(L) 27.5(L) 30.3(L)  Platelets 150.0 - 400.0 K/uL 201.0 154 129(L)      CMP Latest Ref Rng & Units 09/25/2020 09/23/2020 09/16/2020  Glucose 70 - 99 mg/dL 89 158(H) 92  BUN 8 - 23 mg/dL 35(H) 25(H) 18  Creatinine 0.44 - 1.00 mg/dL 5.87(H) 5.48(H) 4.10(H)  Sodium 135 - 145 mmol/L 131(L) 132(L) 134(L)  Potassium 3.5 - 5.1 mmol/L 3.7 4.2 3.9  Chloride 98 - 111 mmol/L 93(L) 91(L) 91(L)  CO2 22 - 32 mmol/L 25 27 -  Calcium 8.9 - 10.3 mg/dL 8.9 9.6 -  Total Protein 6.5 - 8.1 g/dL - - -  Total Bilirubin 0.3 - 1.2 mg/dL - - -  Alkaline Phos 38 - 126 U/L - - -  AST 15 - 41 U/L - - -  ALT 0 - 44 U/L - - -    CrCl cannot be calculated (Patient's most recent lab result is older than the maximum 21 days allowed.).  Morgan Clay. Stanford Breed, MD [facility] Phone Number: [phone] 10/31/2020 10:58 AM

## 2020-11-02 ENCOUNTER — Ambulatory Visit (INDEPENDENT_AMBULATORY_CARE_PROVIDER_SITE_OTHER): Payer: Medicare Other

## 2020-11-02 ENCOUNTER — Other Ambulatory Visit: Payer: Self-pay

## 2020-11-02 ENCOUNTER — Ambulatory Visit (INDEPENDENT_AMBULATORY_CARE_PROVIDER_SITE_OTHER): Payer: Self-pay | Admitting: Vascular Surgery

## 2020-11-02 ENCOUNTER — Encounter: Payer: Self-pay | Admitting: Vascular Surgery

## 2020-11-02 VITALS — BP 122/65 | HR 65 | Temp 97.9°F | Resp 20 | Ht 59.0 in | Wt 131.0 lb

## 2020-11-02 DIAGNOSIS — G8918 Other acute postprocedural pain: Secondary | ICD-10-CM

## 2020-11-02 DIAGNOSIS — I428 Other cardiomyopathies: Secondary | ICD-10-CM

## 2020-11-02 LAB — CUP PACEART REMOTE DEVICE CHECK
Battery Remaining Longevity: 16 mo
Battery Voltage: 2.91 V
Brady Statistic AP VP Percent: 3.31 %
Brady Statistic AP VS Percent: 0.01 %
Brady Statistic AS VP Percent: 96.42 %
Brady Statistic AS VS Percent: 0.26 %
Brady Statistic RA Percent Paced: 3.31 %
Brady Statistic RV Percent Paced: 99.31 %
Date Time Interrogation Session: 20220628001704
HighPow Impedance: 41 Ohm
HighPow Impedance: 72 Ohm
Implantable Lead Implant Date: 20080818
Implantable Lead Implant Date: 20080818
Implantable Lead Implant Date: 20080818
Implantable Lead Location: 753858
Implantable Lead Location: 753859
Implantable Lead Location: 753860
Implantable Lead Model: 4193
Implantable Lead Model: 5076
Implantable Lead Model: 6947
Implantable Pulse Generator Implant Date: 20170531
Lead Channel Impedance Value: 266 Ohm
Lead Channel Impedance Value: 342 Ohm
Lead Channel Impedance Value: 342 Ohm
Lead Channel Impedance Value: 399 Ohm
Lead Channel Impedance Value: 4047 Ohm
Lead Channel Impedance Value: 4047 Ohm
Lead Channel Pacing Threshold Amplitude: 1.125 V
Lead Channel Pacing Threshold Amplitude: 1.25 V
Lead Channel Pacing Threshold Amplitude: 1.625 V
Lead Channel Pacing Threshold Pulse Width: 0.4 ms
Lead Channel Pacing Threshold Pulse Width: 0.4 ms
Lead Channel Pacing Threshold Pulse Width: 0.4 ms
Lead Channel Sensing Intrinsic Amplitude: 15.25 mV
Lead Channel Sensing Intrinsic Amplitude: 15.25 mV
Lead Channel Sensing Intrinsic Amplitude: 4 mV
Lead Channel Sensing Intrinsic Amplitude: 4 mV
Lead Channel Setting Pacing Amplitude: 2.25 V
Lead Channel Setting Pacing Amplitude: 2.25 V
Lead Channel Setting Pacing Amplitude: 2.5 V
Lead Channel Setting Pacing Pulse Width: 0.4 ms
Lead Channel Setting Pacing Pulse Width: 0.4 ms
Lead Channel Setting Sensing Sensitivity: 0.3 mV

## 2020-11-04 ENCOUNTER — Ambulatory Visit: Payer: Medicare Other | Admitting: Internal Medicine

## 2020-11-09 ENCOUNTER — Other Ambulatory Visit (INDEPENDENT_AMBULATORY_CARE_PROVIDER_SITE_OTHER): Payer: Medicare Other

## 2020-11-09 ENCOUNTER — Other Ambulatory Visit: Payer: Self-pay

## 2020-11-09 DIAGNOSIS — E039 Hypothyroidism, unspecified: Secondary | ICD-10-CM

## 2020-11-09 LAB — T4, FREE: Free T4: 1.01 ng/dL (ref 0.60–1.60)

## 2020-11-11 NOTE — Addendum Note (Signed)
Encounter addended by: [name], MD on: 11/11/2020 2:43 PM  Actions taken: Clinical Note Signed

## 2020-11-11 NOTE — Progress Notes (Signed)
I personally reviewed laboratory data, imaging studies and relevant notes. I independently formulated important aspects of the plan. I have edited the note to reflect any of my changes or salient points. I am in agreement with the plan/findings from the above Advanced Practitioner.

## 2020-11-17 ENCOUNTER — Emergency Department (HOSPITAL_COMMUNITY): Payer: Medicare Other

## 2020-11-17 ENCOUNTER — Other Ambulatory Visit: Payer: Self-pay

## 2020-11-17 ENCOUNTER — Observation Stay (HOSPITAL_COMMUNITY)
Admission: EM | Admit: 2020-11-17 | Discharge: 2020-11-17 | Disposition: A | Discharge status: Home / Self Care | Payer: Medicare Other | Attending: Internal Medicine | Admitting: Internal Medicine

## 2020-11-17 ENCOUNTER — Encounter (HOSPITAL_COMMUNITY): Payer: Self-pay | Admitting: Emergency Medicine

## 2020-11-17 DIAGNOSIS — I480 Paroxysmal atrial fibrillation: Secondary | ICD-10-CM

## 2020-11-17 DIAGNOSIS — Z20822 Contact with and (suspected) exposure to covid-19: Secondary | ICD-10-CM | POA: Diagnosis not present

## 2020-11-17 DIAGNOSIS — R000 Tachycardia, unspecified: Secondary | ICD-10-CM | POA: Diagnosis present

## 2020-11-17 DIAGNOSIS — Z8616 Personal history of COVID-19: Secondary | ICD-10-CM | POA: Diagnosis not present

## 2020-11-17 DIAGNOSIS — N184 Chronic kidney disease, stage 4 (severe): Secondary | ICD-10-CM | POA: Diagnosis not present

## 2020-11-17 DIAGNOSIS — I130 Hypertensive heart and chronic kidney disease with heart failure and stage 1 through stage 4 chronic kidney disease, or unspecified chronic kidney disease: Secondary | ICD-10-CM | POA: Diagnosis not present

## 2020-11-17 DIAGNOSIS — I4891 Unspecified atrial fibrillation: Principal | ICD-10-CM | POA: Insufficient documentation

## 2020-11-17 DIAGNOSIS — E1122 Type 2 diabetes mellitus with diabetic chronic kidney disease: Secondary | ICD-10-CM | POA: Insufficient documentation

## 2020-11-17 DIAGNOSIS — Z79899 Other long term (current) drug therapy: Secondary | ICD-10-CM | POA: Insufficient documentation

## 2020-11-17 DIAGNOSIS — I5043 Acute on chronic combined systolic (congestive) and diastolic (congestive) heart failure: Secondary | ICD-10-CM | POA: Insufficient documentation

## 2020-11-17 DIAGNOSIS — E039 Hypothyroidism, unspecified: Secondary | ICD-10-CM | POA: Diagnosis not present

## 2020-11-17 DIAGNOSIS — J45909 Unspecified asthma, uncomplicated: Secondary | ICD-10-CM | POA: Diagnosis not present

## 2020-11-17 HISTORY — DX: Unspecified atrial fibrillation: I48.91

## 2020-11-17 LAB — BRAIN NATRIURETIC PEPTIDE: B Natriuretic Peptide: 617.5 pg/mL — ABNORMAL HIGH (ref 0.0–100.0)

## 2020-11-17 LAB — CBC WITH DIFFERENTIAL/PLATELET
Abs Immature Granulocytes: 0.02 10*3/uL (ref 0.00–0.07)
Basophils Absolute: 0 10*3/uL (ref 0.0–0.1)
Basophils Relative: 1 %
Eosinophils Absolute: 0.3 10*3/uL (ref 0.0–0.5)
Eosinophils Relative: 6 %
HCT: 37.4 % (ref 36.0–46.0)
Hemoglobin: 11.9 g/dL — ABNORMAL LOW (ref 12.0–15.0)
Immature Granulocytes: 0 %
Lymphocytes Relative: 34 %
Lymphs Abs: 1.6 10*3/uL (ref 0.7–4.0)
MCH: 30.5 pg (ref 26.0–34.0)
MCHC: 31.8 g/dL (ref 30.0–36.0)
MCV: 95.9 fL (ref 80.0–100.0)
Monocytes Absolute: 0.4 10*3/uL (ref 0.1–1.0)
Monocytes Relative: 9 %
Neutro Abs: 2.3 10*3/uL (ref 1.7–7.7)
Neutrophils Relative %: 50 %
Platelets: 126 10*3/uL — ABNORMAL LOW (ref 150–400)
RBC: 3.9 MIL/uL (ref 3.87–5.11)
RDW: 14.8 % (ref 11.5–15.5)
WBC: 4.7 10*3/uL (ref 4.0–10.5)
nRBC: 0 % (ref 0.0–0.2)

## 2020-11-17 LAB — BASIC METABOLIC PANEL
Anion gap: 9 (ref 5–15)
BUN: 11 mg/dL (ref 8–23)
CO2: 32 mmol/L (ref 22–32)
Calcium: 9.8 mg/dL (ref 8.9–10.3)
Chloride: 94 mmol/L — ABNORMAL LOW (ref 98–111)
Creatinine, Ser: 2.21 mg/dL — ABNORMAL HIGH (ref 0.44–1.00)
GFR, Estimated: 23 mL/min — ABNORMAL LOW (ref >60)
Glucose, Bld: 99 mg/dL (ref 70–99)
Potassium: 3.6 mmol/L (ref 3.5–5.1)
Sodium: 135 mmol/L (ref 135–145)

## 2020-11-17 LAB — RESP PANEL BY RT-PCR (FLU A&B, COVID) ARPGX2
Influenza A by PCR: NEGATIVE
Influenza B by PCR: NEGATIVE
SARS Coronavirus 2 by RT PCR: NEGATIVE

## 2020-11-17 LAB — MAGNESIUM: Magnesium: 2.2 mg/dL (ref 1.7–2.4)

## 2020-11-17 LAB — PHOSPHORUS: Phosphorus: 2 mg/dL — ABNORMAL LOW (ref 2.5–4.6)

## 2020-11-17 MED ORDER — DILTIAZEM HCL-DEXTROSE 125-5 MG/125ML-% IV SOLN (PREMIX)
5.0000 mg/h | INTRAVENOUS | Status: DC
  Administered 2020-11-17: 5 mg/h via INTRAVENOUS
  Filled 2020-11-17: qty 125

## 2020-11-17 MED ORDER — AMIODARONE LOAD VIA INFUSION: 150.0000 mg | Freq: Once | INTRAVENOUS | Status: DC

## 2020-11-17 MED ORDER — ACETAMINOPHEN 325 MG PO TABS: 650.0000 mg | ORAL_TABLET | Freq: Four times a day (QID) | ORAL | Status: DC | PRN

## 2020-11-17 MED ORDER — GABAPENTIN 300 MG PO CAPS: 300.0000 mg | ORAL_CAPSULE | Freq: Every day | ORAL | Status: DC

## 2020-11-17 MED ORDER — CROMOLYN SODIUM 4 % OP SOLN
1.0000 [drp] | Freq: Two times a day (BID) | OPHTHALMIC | Status: DC
  Filled 2020-11-17: qty 10

## 2020-11-17 MED ORDER — CARVEDILOL 3.125 MG PO TABS: 3.1250 mg | ORAL_TABLET | Freq: Two times a day (BID) | ORAL | Status: DC

## 2020-11-17 MED ORDER — DILTIAZEM HCL 30 MG PO TABS
30.0000 mg | ORAL_TABLET | Freq: Once | ORAL | Status: DC
  Filled 2020-11-17: qty 1

## 2020-11-17 MED ORDER — AMIODARONE HCL 200 MG PO TABS: 200.0000 mg | ORAL_TABLET | Freq: Every day | ORAL | Status: DC

## 2020-11-17 MED ORDER — MELATONIN 3 MG PO TABS: 3.0000 mg | ORAL_TABLET | Freq: Every evening | ORAL | Status: DC | PRN

## 2020-11-17 MED ORDER — PROCHLORPERAZINE EDISYLATE 10 MG/2ML IJ SOLN: 10.0000 mg | Freq: Four times a day (QID) | INTRAMUSCULAR | Status: DC | PRN

## 2020-11-17 MED ORDER — AMIODARONE IV BOLUS ONLY 150 MG/100ML
150.0000 mg | Freq: Once | INTRAVENOUS | Status: AC
  Administered 2020-11-17: 150 mg via INTRAVENOUS
  Filled 2020-11-17: qty 100

## 2020-11-17 MED ORDER — METOPROLOL TARTRATE 5 MG/5ML IV SOLN: 2.5000 mg | Freq: Four times a day (QID) | INTRAVENOUS | Status: DC | PRN

## 2020-11-17 MED ORDER — LEVOTHYROXINE SODIUM 137 MCG PO TABS
137.0000 ug | ORAL_TABLET | Freq: Every day | ORAL | Status: DC
  Filled 2020-11-17: qty 1

## 2020-11-17 MED ORDER — POLYETHYLENE GLYCOL 3350 17 G PO PACK: 17.0000 g | PACK | Freq: Every day | ORAL | Status: DC | PRN

## 2020-11-17 MED ORDER — HYDROCODONE-ACETAMINOPHEN 5-325 MG PO TABS: 1.0000 | ORAL_TABLET | Freq: Four times a day (QID) | ORAL | Status: DC | PRN

## 2020-11-17 MED ORDER — OXYCODONE HCL 5 MG PO TABS
5.0000 mg | ORAL_TABLET | Freq: Once | ORAL | Status: AC
  Administered 2020-11-17: 5 mg via ORAL
  Filled 2020-11-17: qty 1

## 2020-11-17 MED ORDER — FERRIC CITRATE 1 GM 210 MG(FE) PO TABS: 420.0000 mg | ORAL_TABLET | Freq: Three times a day (TID) | ORAL | Status: DC

## 2020-11-17 MED ORDER — DICLOFENAC SODIUM 1 % EX GEL
1.0000 "application " | Freq: Every day | CUTANEOUS | Status: DC
  Filled 2020-11-17: qty 100

## 2020-11-17 MED ORDER — HEPARIN SODIUM (PORCINE) 5000 UNIT/ML IJ SOLN: 5000.0000 [IU] | Freq: Three times a day (TID) | INTRAMUSCULAR | Status: DC

## 2020-11-17 NOTE — ED Provider Notes (Addendum)
[facility] EMERGENCY DEPARTMENT Provider Note   CSN: [id number] Arrival date & time: 11/17/20  1554     History Chief Complaint  Patient presents with   Tachycardia    VPaced at [place] is a 75 y.o. female.  HPI     75 year old female with history of A. fib not on any anticoagulation, CHF, AICD placement comes in with chief complaint of abnormal heart rhythm.  Patient also has history of ESRD on HD.  She reports that she was feeling fine, and during dialysis started having some palpitations.  The dialysis center noted that her heart rate was over 120 and called EMS.  Patient had most of her hemodialysis completed at that time.  She did miss her amiodarone today, but in general has been regular. She denies any new medications.  Review of system is negative for any nausea, vomiting, fevers, chills, cough, chest pain, shortness of breath, near fainting spells or dizziness. Past Medical History:  Diagnosis Date   A-fib ([place])    on Eliquis   AICD (automatic cardioverter/defibrillator) present 10/06/2015   Anemia    Angiodysplasia of small intestine ([place]) 06/10/2020   Ileum - seen on capsule endoscopy 05/2020 - ablated at colonoscopy   ARF (acute renal failure) ([place])    Arthritis    "qwhere" (01/03/2018)   Asthma    reports mild asthma since childhood - had COPD on dx list from prior PCP   Chronic bronchitis ([place])    "get it most years; not this past year though" (01/03/2018)   Chronic kidney disease    "? stage;  followed by [facility]; [name]" (01/03/2018)   Chronic systolic congestive heart failure ([place])    Cirrhosis ([place]) 09/14/2020   dx by Dr Silvano Rusk    COVID-19 05/03/2020   DEPRESSION 12/17/2009   Annotation: PHQ-9 score = 14 done on 12/17/2009 Qualifier: Diagnosis of  By: Hassell Done FNP, [place]     Diabetes mellitus without complication ([place])    DIET CONTROLLED    Diverticulosis    Enteritis    FIBROIDS, UTERUS 03/05/2008    GI bleeding 01/03/2018   Glaucoma    Gout    Hepatitis C    HEPATITIS C - s/p treatment with Harvoni, saw hepatology, Dawn Drazek   History of blood transfusion ~ 11/2017   Hyperlipemia 12/06/2012   HYPERTENSION, BENIGN 04/24/2007   Hypothyroidism    LBBB (left bundle branch block)    Lower GI bleeding    "been dealing w/it since 07/2017" (01/03/2018)   Nonischemic cardiomyopathy ([place])    OBESITY 05/27/2009   OBSTRUCTIVE SLEEP APNEA 11/14/2007   no CPAP   OSTEOPENIA 09/30/2008   Pain and swelling of left upper extremity 08/08/2017   Personal history of other infectious and parasitic disease    Hepatitis B   Pneumonia    "several times" (01/03/2018)   Pulmonary edema, acute ([place]) 08/09/2017    Patient Active Problem List   Diagnosis Date Noted   Bleeding pseudoaneurysm of left brachiocephalic arteriovenous fistula ([place]) 09/24/2020   Acute blood loss anemia 09/24/2020   Bleeding pseudoaneurysm of left brachiocephalic AV fistula ([place]) 09/24/2020   Malnutrition of moderate degree 07/09/2020   Angiodysplasia of small intestine ([place]) 06/10/2020   Acute on chronic diastolic CHF (congestive heart failure) ([place])    COVID-19 virus infection 05/27/2020   Anemia due to chronic blood loss 04/15/2020   Overweight (BMI 25.0-29.9) 03/02/2020   Long term current use  of anticoagulant    Persistent atrial fibrillation ([place]) 02/06/2020   Secondary hypercoagulable state ([place]) 02/06/2020   CHF (congestive heart failure) ([place]) 07/23/2019   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 (HCV) ([place])    Gout    Anemia in chronic kidney disease 01/18/2018   Pulmonary hypertension ([place]) on echocardiogram 01/14/2018   AVM (arteriovenous malformation) of small bowel, acquired 01/14/2018   Atrial fibrillation, chronic 10/25/2017   Aortic atherosclerosis ([place]) 08/30/2017   Diverticulitis of left colon status post robotic low anterior to sigmoid resection 05/22/2018 07/31/2017   Hypertension associated  with diabetes ([place]) 06/07/2017   MDD (major depressive disorder), recurrent, in partial remission ([place]) 06/07/2017   Asthma 05/22/2017   Chronic obstructive pulmonary disease ([place])    Atypical atrial flutter ([place]) 09/28/2015   Type 2 diabetes, controlled, with renal manifestation ([place]) 11/24/2013   CKD (chronic kidney disease) stage 4, GFR 15-29 ml/min  11/24/2013   Nonischemic cardiomyopathy ([place]) 11/22/2010   Obesity 05/27/2009   OSTEOPENIA 09/30/2008   FIBROIDS, UTERUS 03/05/2008   OBSTRUCTIVE SLEEP APNEA 11/14/2007   Chronic diastolic CHF (congestive heart failure) ([place]) 04/24/2007   Hypothyroidism 01/28/2007   Biventricular ICD (implantable cardioverter-defibrillator) in place 01/08/2007    Past Surgical History:  Procedure Laterality Date   APPENDECTOMY     AV FISTULA PLACEMENT Left 08/05/2020   Procedure: LEFT UPPER EXTREMITY ARTERIOVENOUS (AV) FISTULA CREATION;  Surgeon: Cherre Robins, MD;  Location: [facility];  Service: Vascular;  Laterality: Left;   [place] Left 09/16/2020   Procedure: LEFT SECOND STAGE [place];  Surgeon: Cherre Robins, MD;  Location: [facility];  Service: Vascular;  Laterality: Left;  PERIPHERAL NERVE BLOCK   CARDIAC CATHETERIZATION     CARDIOVERSION N/A 12/14/2014   Procedure: CARDIOVERSION;  Surgeon: Dorothy Spark, MD;  Location: [facility];  Service: Cardiovascular;  Laterality: N/A;   CARDIOVERSION N/A 02/26/2017   Procedure: CARDIOVERSION;  Surgeon: Evans Lance, MD;  Location: [place] CV LAB;  Service: Cardiovascular;  Laterality: N/A;   CARDIOVERSION N/A 07/24/2017   Procedure: CARDIOVERSION;  Surgeon: Acie Fredrickson Wonda Cheng, MD;  Location: [facility] ENDOSCOPY;  Service: Cardiovascular;  Laterality: N/A;   CARDIOVERSION N/A 02/12/2020   Procedure: CARDIOVERSION;  Surgeon: [name], MD;  Location: [facility] ENDOSCOPY;  Service: Cardiovascular;  Laterality: N/A;   COLONOSCOPY N/A 11/08/2017   Procedure: COLONOSCOPY;   Surgeon: Milus Banister, MD;  Location: [facility] ENDOSCOPY;  Service: Endoscopy;  Laterality: N/A;   COLONOSCOPY WITH PROPOFOL N/A 05/31/2020   Procedure: COLONOSCOPY WITH PROPOFOL;  Surgeon: Irene Shipper, MD;  Location: [facility]COPY;  Service: Endoscopy;  Laterality: N/A;   DILATION AND CURETTAGE OF UTERUS     ENTEROSCOPY N/A 03/02/2020   Procedure: ENTEROSCOPY;  Surgeon: Yetta Flock, MD;  Location: [facility] ENDOSCOPY;  Service: Gastroenterology;  Laterality: N/A;   EP IMPLANTABLE DEVICE N/A 10/06/2015   Procedure: BIV ICD Generator Changeout;  Surgeon: [name], MD;  Location: [place] CV LAB;  Service: Cardiovascular;  Laterality: N/A;   ESOPHAGOGASTRODUODENOSCOPY N/A 10/26/2017   Procedure: ESOPHAGOGASTRODUODENOSCOPY (EGD);  Surgeon: Ladene Artist, MD;  Location[facility];  Service: Endoscopy;  Laterality: N/A;   ESOPHAGOGASTRODUODENOSCOPY (EGD) WITH PROPOFOL N/A 11/07/2017   Procedure: ESOPHAGOGASTRODUODENOSCOPY (EGD) WITH PROPOFOL;  Surgeon: Milus Banister, MD;  [facility] ENDOS[facility];  Service: Endoscopy;  Laterality: N/A;   ESOPHAGOGASTRODUODENOSCOPY (EGD) WITH PROPOFOL N/A 05/29/2020   Procedure: ESOPHAGOGASTRODUODENOSCOPY (EGD) WITH PROPOFOL;  Surgeon: Doran Stabler, MD;  Location: [facility] ENDOSCOPY;  Service: Gastroenterology;  Laterality: N/A;   GIVENS CAPSULE STUDY N/A 05/19/2020   Procedure: GIVENS CAPSULE STUDY;  Surgeon: [name], MD;  Location: [facility];  Service: Endoscopy;  Laterality: N/A;  .adm for obs since pacemaker, PA wil enter order and see pt   GIVENS CAPSULE STUDY N/A 05/29/2020   Procedure: GIVENS CAPSULE STUDY;  Surgeon: Doran Stabler, MD;  Location: [facility];  Service: Gastroenterology;  Laterality: N/A;   HEMATOMA EVACUATION Left 09/24/2020   Procedure: EVACUATION HEMATOMA ARM;  Surgeon: Rosetta Posner, MD;  Location[facility] OR;  Service: Vascular;  Laterality: Left;   HOT HEMOSTASIS N/A 10/26/2017   Procedure: HOT HEMOSTASIS (ARGON PLASMA  COAGULATION/BICAP);  Surgeon: Ladene Artist, MD;  Location: [facility];  Service: Endoscopy;  Laterality: N/A;   HOT HEMOSTASIS N/A 03/02/2020   Procedure: HOT HEMOSTASIS (ARGON PLASMA COAGULATION/BICAP);  Surgeon: Yetta Flock, MD;  Location: [facility] ENDOSCOPY;  Service: Gastroenterology;  Laterality: N/A;   HOT HEMOSTASIS N/A 05/31/2020   Procedure: HOT HEMOSTASIS (ARGON PLASMA COAGULATION/BICAP);  Surgeon: Irene Shipper, MD;  Location: [facility]OSCOPY;  Service: Endoscopy;  Laterality: N/A;   INSERT / REPLACE / REMOVE PACEMAKER  ?2008   IR PERC TUN PERIT CATH WO PORT S&I /IMAG  07/05/2020   POLYPECTOMY  11/08/2017   Procedure: POLYPECTOMY;  Surgeon: Milus Banister, MD;  [facility] ENDOSCOPY;  Service: Endoscopy;;   PROCTOSCOPY N/A 05/22/2018   Procedure: RIGID PROCTOSCOPY;  Surgeon: Michael Boston, MD;  Location: [facility] ORS;  Service: General;  Laterality: N/A;   RIGHT HEART CATH N/A 02/13/2018   Procedure: RIGHT HEART CATH;  Surgeon: [name], MD;  Location: [place] CV LAB;  Service: Cardiovascular;  Laterality: N/A;   RIGHT HEART CATH N/A 06/23/2020   Procedure: RIGHT HEART CATH;  Surgeon: [name], MD;  Location: [place] CV LAB;  Service: Cardiovascular;  Laterality: N/A;   RIGHT/LEFT HEART CATH AND CORONARY ANGIOGRAPHY N/A 12/11/2019   Procedure: RIGHT/LEFT HEART CATH AND CORONARY ANGIOGRAPHY;  Surgeon: [name], MD;  Location: [place] CV LAB;  Service: Cardiovascular;  Laterality: N/A;   SUBMUCOSAL TATTOO INJECTION  03/02/2020   Procedure: SUBMUCOSAL TATTOO INJECTION;  Surgeon: Yetta Flock, MD;  Location: [facility] ENDOSCOPY;  Service: Gastroenterology;;   SUBMUCOSAL TATTOO INJECTION  05/31/2020   Procedure: SUBMUCOSAL TATTOO INJECTION;  Surgeon: Irene Shipper, MD;  Location: [facility] ENDOSCOPY;  Service: Endoscopy;;   TUBAL LIGATION     XI ROBOTIC ASSISTED LOWER ANTERIOR RESECTION N/A 05/22/2018   Procedure: XI ROBOTIC ASSISTED SIGMOID COLOECTOMY MOBILIZATION  OF SPLENIC FLEXURE, FIREFLY ASSESSMENT OF PERFUSION;  Surgeon: Michael Boston, MD;  Location: [facility] ORS;  Service: General;  Laterality: N/A;  ERAS PATHWAY     OB History   No obstetric history on file.     Family History  Problem Relation Age of Onset   Asthma Father    Heart attack Father    Asthma Sister    Lung cancer Sister    Heart attack Mother    Stroke Brother    Colon cancer Neg Hx    Esophageal cancer Neg Hx    Pancreatic cancer Neg Hx    Stomach cancer Neg Hx    Liver disease Neg Hx     Social History   Tobacco Use   Smoking status: Never   Smokeless tobacco: Never  Vaping Use   Vaping Use: Never used  Substance Use Topics   Alcohol  use: Not Currently    Comment: 01/03/2018 "couple drinks/month"   Drug use: Not Currently    Types: Marijuana    Comment: VERY INTERMITTENT     Home Medications Prior to Admission medications   Medication Sig Start Date End Date Taking? Authorizing Provider  amiodarone (PACERONE) 200 MG tablet Take 1 tablet (200 mg total) by mouth daily. 07/27/20  Yes Clegg, [name] D, NP  AURYXIA 1 GM 210 MG(Fe) tablet Take 420 mg by mouth 3 (three) times daily with meals. 08/31/20  Yes [provider]  carvedilol (COREG) 3.125 MG tablet Take 1 tablet (3.125 mg total) by mouth 2 (two) times daily with a meal. 07/12/20  Yes Lyda Jester M, PA-C  cromolyn (OPTICROM) 4 % ophthalmic solution Place 1 drop into both eyes 2 (two) times daily. 06/14/20  Yes [provider]  diclofenac Sodium (VOLTAREN) 1 % GEL Apply 1 application topically daily. 11/11/20  Yes [provider]  gabapentin (NEURONTIN) 300 MG capsule Take 1 capsule (300 mg total) by mouth daily. 07/12/20  Yes Lyda Jester M, PA-C  HYDROcodone-acetaminophen (NORCO/VICODIN) 5-325 MG tablet Take 1 tablet by mouth 3 (three) times daily as needed for pain. 10/29/20  Yes [provider]  isosorbide-hydrALAZINE (BIDIL) 20-37.5 MG tablet Take 0.5 tablets by mouth 3  (three) times daily. 10/21/20  Yes [name], MD  levothyroxine (SYNTHROID) 137 MCG tablet Take 1 tablet (137 mcg total) by mouth daily before breakfast. 03/11/20  Yes Billie Ruddy, MD    Allergies    Patient has no known allergies.  Review of Systems   Review of Systems  Constitutional:  Negative for activity change.  Respiratory:  Negative for shortness of breath.   Cardiovascular:  Positive for palpitations. Negative for chest pain.  Gastrointestinal:  Negative for nausea and vomiting.  Neurological:  Negative for dizziness.  Hematological:  Does not bruise/bleed easily.  All other systems reviewed and are negative.  Physical Exam Updated Vital Signs BP 123/79   Pulse (!) 125   Temp 97.7 F (36.5 C) (Oral)   Resp (!) 9   Ht 4\' 11"  (1.499 m)   Wt 59 kg   SpO2 95%   BMI 26.26 kg/m   Physical Exam Vitals and nursing note reviewed.  Constitutional:      Appearance: She is well-developed.  HENT:     Head: Atraumatic.  Cardiovascular:     Rate and Rhythm: Tachycardia present. Rhythm irregular.  Pulmonary:     Effort: Pulmonary effort is normal.  Musculoskeletal:     Cervical back: Normal range of motion and neck supple.  Skin:    General: Skin is warm and dry.  Neurological:     Mental Status: She is alert and oriented to person, place, and time.    ED Results / Procedures / Treatments   Labs (all labs ordered are listed, but only abnormal results are displayed) Labs Reviewed  BASIC METABOLIC PANEL - Abnormal; Notable for the following components:      Result Value   Chloride 94 (*)    Creatinine, Ser 2.21 (*)    GFR, Estimated 23 (*)    All other components within normal limits  BRAIN NATRIURETIC PEPTIDE - Abnormal; Notable for the following components:   B Natriuretic Peptide 617.5 (*)    All other components within normal limits  CBC WITH DIFFERENTIAL/PLATELET - Abnormal; Notable for the following components:   Hemoglobin 11.9 (*)    Platelets  126 (*)  All other components within normal limits  PHOSPHORUS - Abnormal; Notable for the following components:   Phosphorus 2.0 (*)    All other components within normal limits  MAGNESIUM    EKG None ED ECG REPORT   Date: 11/17/2020  Rate: 121  Rhythm: atrial fibrillation, wide complex, irregular rhythm  QRS Axis: left  Intervals: normal  ST/T Wave abnormalities: nonspecific ST/T changes  Conduction Disutrbances: paced  Narrative Interpretation:   Old EKG Reviewed: changes noted  I have personally reviewed the EKG tracing and agree with the computerized printout as noted.   Radiology DG Chest Port 1 View  Result Date: 11/17/2020 CLINICAL DATA:  Tachycardia EXAM: PORTABLE CHEST 1 VIEW COMPARISON:  Sep 25, 2020 FINDINGS: Right IJ dialysis catheter with tip overlying the right atrium. Stable cardiomegaly. Central vascular prominence. Aortic atherosclerosis. Mild bibasilar interstitial opacities. No visible pleural effusion. The visualized skeletal structures are unremarkable. IMPRESSION: Cardiomegaly with central vascular prominence and mild bibasilar interstitial opacities, likely mild edema. Electronically Signed   By: Dahlia Bailiff MD   On: 11/17/2020 16:55    Procedures .Critical Care  Date/Time: 11/17/2020 7:44 PM Performed by: Varney Biles, MD Authorized by: Varney Biles, MD   Critical care provider statement:    Critical care time (minutes):  48   Critical care was necessary to treat or prevent imminent or life-threatening deterioration of the following conditions:  Cardiac failure and circulatory failure   Critical care was time spent personally by me on the following activities:  Discussions with consultants, evaluation of patient's response to treatment, examination of patient, ordering and performing treatments and interventions, ordering and review of laboratory studies, ordering and review of radiographic studies, pulse oximetry, re-evaluation of patient's  condition, obtaining history from patient or surrogate and review of old charts   I assumed direction of critical care for this patient from another provider in my specialty: yes   121  Medications Ordered in ED Medications  diltiazem (CARDIZEM) 125 mg in dextrose 5% 125 mL (1 mg/mL) infusion (has no administration in time range)  amiodarone (NEXTERONE) IV bolus only 150 mg/100 mL (0 mg Intravenous Stopped 11/17/20 1857)    ED Course  I have reviewed the triage vital signs and the nursing notes.  Pertinent labs & imaging results that were available during my care of the patient were reviewed by me and considered in my medical decision making (see chart for details).    MDM Rules/Calculators/A&P                          49-year-old female comes in with chief complaint of elevated heart rate.  She has history of A. fib, ESRD, CHF, AICD placement.  Heart rate at 130s.  Appears to be irregularly irregular on pulse check.  Likely she is having A. fib with RVR.  She does indicate not taking her amnio earlier today, but otherwise is compliant with her medications.  Review of system and history is not indicative of underlying infectious process.  I discussed the case with cardiology service.  She is on amiodarone normally and carvedilol.  Our approach is going to be to give her amiodarone bolus, if she does not respond then initiate diltiazem drip and admit her.  7:45 PM Did not respond to amnio bolus.  We will start diltiazem drip and proceed with admission request.  9:24 PM Patient converted to sinus rhythm.  I consulted cardiology again.  They have seen the patient and  are recommending discharging the patient home.  Medicine team has been informed as they had already put in admission orders.  She is stable for discharge from our side.  Final Clinical Impression(s) / ED Diagnoses Final diagnoses:  Atrial fibrillation with rapid ventricular response [facility])    Rx / DC Orders ED Discharge  Orders     None        Varney Biles, MD 11/17/20 1945    Varney Biles, MD 11/17/20 2124

## 2020-11-17 NOTE — Consult Note (Signed)
Cardiology Consult    Patient ID: Morgan Clay MRN: [id number], DOB/AGE: 1946-02-20   Admit date: 11/17/2020 Date of Consult: 11/17/2020 Requesting Provider: Dr. Mariane Masters  PCP:  Mackie Pai, [place] Providers Cardiologist:  Evette Cristal Click here to update MD or APP on Care Team, Refresh:1}    Patient Profile    Morgan Clay is a 75 y.o. female with a history of ESRD on HD, AF not on anticoagulation due to recurrent bleeding, BiV-ICD, HCV, cirrhosis, obesity, OSA, and HTN followed by Dr. Caryl Comes and Dr. Quentin Ore who presents with AF with RVR  History of Present Illness    Pleasant 37 yea rold female with ESRD, hypothyroidism, pAF, HCV with cirrhosis and recurrent GI bleeds presented to ED today for evaluation of AF with RVR.  She was in her usual state of health today when she developed tachycardia to the 120-140 range and EMS was called from her dialysis center.  She was aware of her elevated heart rate and uncomfortable with this but felt no presyncope, shortness of breath, or chest pain.  She has been on amiodarone since March when she developed atrial fibrillation and was initiated on amiodarone,  She doesn't know of any recurrences.  In the ED was briefly started on diltiazem infusion and converted back to NSR and was feeling well.  Discussed options including amiodarone uptitration but, in light of CLD and hypothyroidism discussed with her that we should observe for now prior to any dosing adjustment.  She is agreeable.  Revisited reasoning behind no anticoagulation (recurrent bleeding) and she has bene determined not to be a candidate for Watchman by Dr. Quentin Ore.  Discussed elevated stroke risk but appears to be contraindicated by bleed risk.  Past Medical History   Past Medical History:  Diagnosis Date   A-fib ([place])    on Eliquis   AICD (automatic cardioverter/defibrillator) present 10/06/2015   Anemia    Angiodysplasia of small intestine ([place])  06/10/2020   Ileum - seen on capsule endoscopy 05/2020 - ablated at colonoscopy   ARF (acute renal failure) ([place])    Arthritis    "qwhere" (01/03/2018)   Asthma    reports mild asthma since childhood - had COPD on dx list from prior PCP   Chronic bronchitis ([place])    "get it most years; not this past year though" (01/03/2018)   Chronic kidney disease    "? stage;  followed by [facility]; [name]" (01/03/2018)   Chronic systolic congestive heart failure ([place])    Cirrhosis ([place]) 09/14/2020   dx by Dr Silvano Rusk    COVID-19 05/03/2020   DEPRESSION 12/17/2009   Annotation: PHQ-9 score = 14 done on 12/17/2009 Qualifier: Diagnosis of  By: Hassell Done FNP, [place]     Diabetes mellitus without complication ([place])    DIET CONTROLLED    Diverticulosis    Enteritis    FIBROIDS, UTERUS 03/05/2008   GI bleeding 01/03/2018   Glaucoma    Gout    Hepatitis C    HEPATITIS C - s/p treatment with Harvoni, saw hepatology, Dawn Drazek   History of blood transfusion ~ 11/2017   Hyperlipemia 12/06/2012   HYPERTENSION, BENIGN 04/24/2007   Hypothyroidism    LBBB (left bundle branch block)    Lower GI bleeding    "been dealing w/it since 07/2017" (01/03/2018)   Nonischemic cardiomyopathy ([place])    OBESITY 05/27/2009   OBSTRUCTIVE SLEEP APNEA 11/14/2007   no CPAP   OSTEOPENIA 09/30/2008   Pain  and swelling of left upper extremity 08/08/2017   Personal history of other infectious and parasitic disease    Hepatitis B   Pneumonia    "several times" (01/03/2018)   Pulmonary edema, acute ([place]) 08/09/2017    Past Surgical History:  Procedure Laterality Date   APPENDECTOMY     AV FISTULA PLACEMENT Left 08/05/2020   Procedure: LEFT UPPER EXTREMITY ARTERIOVENOUS (AV) FISTULA CREATION;  Surgeon: Cherre Robins, MD;  Location: [place];  Service: Vascular;  Laterality: Left;   [place] Left 09/16/2020   Procedure: LEFT SECOND STAGE [place];  Surgeon: Cherre Robins, MD;   Location: [place];  Service: Vascular;  Laterality: Left;  PERIPHERAL NERVE BLOCK   CARDIAC CATHETERIZATION     CARDIOVERSION N/A 12/14/2014   Procedure: CARDIOVERSION;  Surgeon: Dorothy Spark, MD;  Location: [place];  Service: Cardiovascular;  Laterality: N/A;   CARDIOVERSION N/A 02/26/2017   Procedure: CARDIOVERSION;  Surgeon: Evans Lance, MD;  Location: [place] [facility];  Service: Cardiovascular;  Laterality: N/A;   CARDIOVERSION N/A 07/24/2017   Procedure: CARDIOVERSION;  Surgeon: Acie Fredrickson Wonda Cheng, MD;  Location: [facility] ENDOSCOPY;  Service: Cardiovascular;  Laterality: N/A;   CARDIOVERSION N/A 02/12/2020   Procedure: CARDIOVERSION;  Surgeon: [name], MD;  Location: [facility] ENDOSCOPY;  Service: Cardiovascular;  Laterality: N/A;   COLONOSCOPY N/A 11/08/2017   Procedure: COLONOSCOPY;  Surgeon: Milus Banister, MD;  Location: [facility] ENDOSCOPY;  Service: Endoscopy;  Laterality: N/A;   COLONOSCOPY WITH PROPOFOL N/A 05/31/2020   Procedure: COLONOSCOPY WITH PROPOFOL;  Surgeon: Irene Shipper, MD;  Location: [facility] ENDOSCOPY;  Service: Endoscopy;  Laterality: N/A;   DILATION AND CURETTAGE OF UTERUS     ENTEROSCOPY N/A 03/02/2020   Procedure: ENTEROSCOPY;  Surgeon: Yetta Flock, MD;  Location: [facility] ENDOSCOPY;  Service: Gastroenterology;  Laterality: N/A;   EP IMPLANTABLE DEVICE N/A 10/06/2015   Procedure: BIV ICD Generator Changeout;  Surgeon: [name], MD;  Location: [place] [facility];  Service: Cardiovascular;  Laterality: N/A;   ESOPHAGOGASTRODUODENOSCOPY N/A 10/26/2017   Procedure: ESOPHAGOGASTRODUODENOSCOPY (EGD);  Surgeon: Ladene Artist, MD;  Location: [facility];  Service: Endoscopy;  Laterality: N/A;   ESOPHAGOGASTRODUODENOSCOPY (EGD) WITH PROPOFOL N/A 11/07/2017   Procedure: ESOPHAGOGASTRODUODENOSCOPY (EGD) WITH PROPOFOL;  Surgeon: Milus Banister, MD;  Location: [facility] ENDOSCOPY;  Service: Endoscopy;  Laterality: N/A;   ESOPHAGOGASTRODUODENOSCOPY (EGD) WITH PROPOFOL N/A 05/29/2020    Procedure: ESOPHAGOGASTRODUODENOSCOPY (EGD) WITH PROPOFOL;  Surgeon: Doran Stabler, MD;  Location: [place];  Service: Gastroenterology;  Laterality: N/A;   GIVENS CAPSULE STUDY N/A 05/19/2020   Procedure: GIVENS CAPSULE STUDY;  Surgeon: [name], MD;  Location: [place];  Service: Endoscopy;  Laterality: N/A;  .adm for obs since pacemaker, PA wil enter order and see pt   GIVENS CAPSULE STUDY N/A 05/29/2020   Procedure: GIVENS CAPSULE STUDY;  Surgeon: Doran Stabler, MD;  Location: [place];  Service: Gastroenterology;  Laterality: N/A;   HEMATOMA EVACUATION Left 09/24/2020   Procedure: EVACUATION HEMATOMA ARM;  Surgeon: Rosetta Posner, MD;  Location: [facility] OR;  Service: Vascular;  Laterality: Left;   HOT HEMOSTASIS N/A 10/26/2017   Procedure: HOT HEMOSTASIS (ARGON PLASMA COAGULATION/BICAP);  Surgeon: Ladene Artist, MD;  Location: [facility];  Service: Endoscopy;  Laterality: N/A;   HOT HEMOSTASIS N/A 03/02/2020   Procedure: HOT HEMOSTASIS (ARGON PLASMA COAGULATION/BICAP);  Surgeon: Yetta Flock, MD;  Location[facility] END[facility]COPY;  Service: Gastroenterology;  Laterality:  N/A;   HOT HEMOSTASIS N/A 05/31/2020   Procedure: HOT HEMOSTASIS (ARGON PLASMA COAGULATION/BICAP);  Surgeon: Irene Shipper, MD;  Location[facility];  Service: Endoscopy;  Laterality: N/A;   INSERT / REPLACE / REMOVE PACEMAKER  ?2008   IR PERC TUN PERIT CATH WO PORT S&I /IMAG  07/05/2020   POLYPECTOMY  11/08/2017   Procedure: POLYPECTOMY;  Surgeon: Milus Banister, MD;  Location: [facility] ENDOSCOPY;  Service: Endoscopy;;   PROCTOSCOPY N/A 05/22/2018   Procedure: RIGID PROCTOSCOPY;  Surgeon: Michael Boston, MD;  Location: [facility] ORS;  Service: General;  Laterality: N/A;   RIGHT HEART CATH N/A 02/13/2018   Procedure: RIGHT HEART CATH;  Surgeon: [name], MD;  Location: [place] [facility] LAB;  Service: Cardiovascular;  Laterality: N/A;   RIGHT HEART CATH N/A 06/23/2020   Procedure: RIGHT HEART CATH;  Surgeon:  [name], MD;  Location: [place] [facility];  Service: Cardiovascular;  Laterality: N/A;   RIGHT/LEFT HEART CATH AND CORONARY ANGIOGRAPHY N/A 12/11/2019   Procedure: RIGHT/LEFT HEART CATH AND CORONARY ANGIOGRAPHY;  Surgeon: [name], MD;  Location: [place] [facility];  Service: Cardiovascular;  Laterality: N/A;   SUBMUCOSAL TATTOO INJECTION  03/02/2020   Procedure: SUBMUCOSAL TATTOO INJECTION;  Surgeon: Yetta Flock, MD;  Location: [place] ENDOSCOPY;  Service: Gastroenterology;;   SUBMUCOSAL TATTOO INJECTION  05/31/2020   Procedure: SUBMUCOSAL TATTOO INJECTION;  Surgeon: Irene Shipper, MD;  Location: [facility] ENDOSCOPY;  Service: Endoscopy;;   TUBAL LIGATION     XI ROBOTIC ASSISTED LOWER ANTERIOR RESECTION N/A 05/22/2018   Procedure: XI ROBOTIC ASSISTED SIGMOID COLOECTOMY MOBILIZATION OF SPLENIC FLEXURE, FIREFLY ASSESSMENT OF PERFUSION;  Surgeon: Michael Boston, MD;  Location: [facility];  Service: General;  Laterality: N/A;  ERAS PATHWAY     No Known Allergies Inpatient Medications     amiodarone  200 mg Oral Daily   carvedilol  3.125 mg Oral BID WC   cromolyn  1 drop Both Eyes BID   diclofenac Sodium  1 application Topical Daily   diltiazem  30 mg Oral Once   [START ON 11/18/2020] ferric citrate  420 mg Oral TID WC   gabapentin  300 mg Oral Daily   heparin  5,000 Units Subcutaneous Q8H   [START ON 11/18/2020] levothyroxine  137 mcg Oral QAC breakfast    Family History    Family History  Problem Relation Age of Onset   Asthma Father    Heart attack Father    Asthma Sister    Lung cancer Sister    Heart attack Mother    Stroke Brother    Colon cancer Neg Hx    Esophageal cancer Neg Hx    Pancreatic cancer Neg Hx    Stomach cancer Neg Hx    Liver disease Neg Hx    She indicated that her mother is deceased. She indicated that her father is deceased. She indicated that her sister is deceased. She indicated that her brother is alive. She indicated that her maternal grandmother  is deceased. She indicated that her maternal grandfather is deceased. She indicated that her paternal grandmother is deceased. She indicated that her paternal grandfather is deceased. She indicated that the status of her neg hx is unknown.   Social History    Social History   Socioeconomic History   Marital status: Divorced    Spouse name: Not on file   Number of children: 1   Years of education: 12   Highest education level: Not  on file  Occupational History   Not on file  Tobacco Use   Smoking status: Never   Smokeless tobacco: Never  Vaping Use   Vaping Use: Never used  Substance and Sexual Activity   Alcohol use: Not Currently    Comment: 01/03/2018 "couple drinks/month"   Drug use: Not Currently    Types: Marijuana    Comment: VERY INTERMITTENT    Sexual activity: Not Currently  Other Topics Concern   Not on file  Social History Narrative   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      Home Situation: lives alone      Spiritual Beliefs: Christian - Baptist      Lifestyle: no regular exercise; poor diet      She is divorced at least one adult child   Never smoker    rare alcohol to occasional at best    no substance abuse         Social Determinants of Radio broadcast assistant Strain: Low Risk    Difficulty of Paying Living Expenses: Not hard at all  Food Insecurity: No Food Insecurity   Worried About Charity fundraiser in the Last Year: Never true   [place] in the Last Year: Never true  Transportation Needs: No Transportation Needs   Lack of Transportation (Medical): No   Lack of Transportation (Non-Medical): No  Physical Activity: Not on file  Stress: Not on file  Social Connections: Not on file  Intimate Partner Violence: Not on file     Review of Systems    General:  No chills, fever, night sweats or weight changes.  Cardiovascular:  No chest pain, dyspnea on exertion, edema, orthopnea, palpitations, paroxysmal nocturnal  dyspnea. Dermatological: No rash, lesions/masses Respiratory: No cough, dyspnea Urologic: No hematuria, dysuria Abdominal:   No nausea, vomiting, diarrhea, bright red blood per rectum, melena, or hematemesis Neurologic:  No visual changes, wkns, changes in mental status. All other systems reviewed and are otherwise negative except as noted above.  Physical Exam    Blood pressure (!) 131/57, pulse 63, temperature 97.7 F (36.5 C), temperature source Oral, resp. rate 10, height 4\' 11"  (1.499 m), weight 59 kg, SpO2 99 %.    No intake or output data in the 24 hours ending 11/17/20 2215 Wt Readings from Last 3 Encounters:  11/17/20 59 kg  11/02/20 59.4 kg  10/19/20 59.3 kg    CONSTITUTIONAL: alert and conversant, well-appearing, nourished, no distress HEENT: normal NECK: no JVD, no masses CARDIAC: Regular rhythm. Normal S1/S2, no S3/S4. No murmur. No friction rub. JVP flat VASCULAR: Radial pulses intact bilaterally. No carotid bruits. PULMONARY/CHEST WALL: no deformities, normal breath sounds bilaterally, normal work of breathing ABDOMINAL: soft, non-tender, non-distended EXTREMITIES: no edema, no muscle atrophy, warm and well-perfused SKIN: Dry and intact without apparent rashes or wounds. No peripheral cyanosis. NEUROLOGIC: alert, no abnormal movements, cranial nerves grossly intact. PSYCH: normal affect, normal speech and language   Labs    No results for input(s): CKTOTAL, CKMB, TROPONINIHS, RELINDX in the last 72 hours. Lab Results  Component Value Date   WBC 4.7 11/17/2020   HGB 11.9 (L) 11/17/2020   HCT 37.4 11/17/2020   MCV 95.9 11/17/2020   PLT 126 (L) 11/17/2020    Recent Labs  Lab 11/17/20 1634  NA 135  K 3.6  CL 94*  CO2 32  BUN 11  CREATININE 2.21*  CALCIUM 9.8  GLUCOSE 99   Lab Results  Component Value Date   CHOL 89 07/23/2019   HDL 36.40 (L) 07/23/2019   LDLCALC 33 07/23/2019   TRIG 97.0 07/23/2019   No results found for: DDIMER Recent Labs     06/21/20 1501 06/23/20 1224 11/17/20 1634  BNP 981.7* 1,099.2* 617.5*   No results for input(s): PROBNP in the last 8760 hours.    Radiology Studies    DG Chest Port 1 View  Result Date: 11/17/2020 CLINICAL DATA:  Tachycardia EXAM: PORTABLE CHEST 1 VIEW COMPARISON:  Sep 25, 2020 FINDINGS: Right IJ dialysis catheter with tip overlying the right atrium. Stable cardiomegaly. Central vascular prominence. Aortic atherosclerosis. Mild bibasilar interstitial opacities. No visible pleural effusion. The visualized skeletal structures are unremarkable. IMPRESSION: Cardiomegaly with central vascular prominence and mild bibasilar interstitial opacities, likely mild edema. Electronically Signed   By: Dahlia Bailiff MD   On: 11/17/2020 16:55   CUP PACEART REMOTE DEVICE CHECK  Result Date: 11/02/2020 Scheduled remote reviewed. Normal device function.  AT/AF burden 0.9%. Presenting rhythm AT with BiV pacing.  26 treated atrial therapies  Optivol fluid index peaked above threshold since around the end of May. Thoracic impedance well below daily reference. Not anticoagulation candidate due to history of severe GI bleed. Routing for further review. Next remote 91 days- JBox, RN/CVRS   ECG & Cardiac Imaging    AF w/ rapid venticular response.   Assessment & Plan    75 year old female with ESRD, HCV cirrhosis, NICM s/p CRT-D with recovery, pAF on amiodarone not on anticoagulation given recurrent bleeding presents with paroxysm of AF now resolved.  Discussed there's no clear indication for admission now that she has converted.  Given hypothyroidsim (TSH 50 but normal T4 today), chronic liver disease would prefer to avoid amiodarone increase for now and continue with amiodarone at current dose.  Reviewed with her that if she does not have alarm symptoms (chest pain, shortness of breath, light headedness, feeling generally unwell) okay to observe paroxysms overnight and contact us if episodes persist for  expedited outpatient evaluation and she is agreeable.  Okay to discharge from arrhythmia perspective.  #pAF on amiodarone, not on anticoagulation CHA2DS2-VASc Score = 5  The patient's score is based upon: CHF History: Yes HTN History: Yes Diabetes History: Yes Stroke History: No Vascular Disease History: No HASBled score 4 with history recurrent blees - Continue amiodarone 200 mg daily - Continue carvedilol 3.125 mg BID - No AC as above with recurrent bleeding - Not a candidate for watchman device per Dr. Quentin Ore.   Signed,  Delight Hoh, MD    11/17/2020 10:22 PM       Signed, Delight Hoh, MD 11/17/2020, 10:15 PM  For questions or updates, please contact   Please consult [email or web address] for contact info under Cardiology/STEMI.

## 2020-11-17 NOTE — ED Notes (Signed)
Patients [employer] Pacemaker interrogated at bedside.

## 2020-11-17 NOTE — H&P (Signed)
History and Physical  Morgan Clay OZH:[id number] DOB: 06-Jul-1945 DOA: 11/17/2020  Referring physician: Dr. Kathrynn Humble, [place]. PCP: Elise Benne  Outpatient Specialists: Cardiology, nephrology. Patient coming from: Home through hemodialysis center.  Chief Complaint: Atrial fibrillation with rapid ventricular response.  HPI: Morgan Clay is a 75 y.o. female with medical history significant for ESRD on HD MWF, chronic A. fib on amiodarone, not on oral anticoagulation due to history of GI bleeds, chronic diastolic CHF, who presented to [facility] ED from hemodialysis center due to A. fib with RVR with rates in the 120's-180's.  Patient did not take her amiodarone the morning of this presentation.  She does not usually take her AM meds prior to going to hemodialysis.  She states she feels fine and was feeling fine prior to hemodialysis today.  She had her usual cup of coffee but not her bagel which she normally eats prior to hemodialysis.  Cardiology was contacted by EDP who recommended amiodarone bolus and if no improvement to start diltiazem drip.  Patient is currently on diltiazem drip with rates in the 120s.  Cardiology has been consulted.  TRH, hospitalist team, was asked to admit.   ED Course:  Temperature 97.7.  BP 132/82, pulse 128, respiration rate 23, O2 saturation 100% on room air.  Lab studies remarkable for WBC 4.7, hemoglobin 11.9, MCV 95, platelet count 126.  Serum sodium 135, potassium 3.6, serum bicarb 32, BUN 11, creatinine 2.21, phosphorus 2.0, magnesium 2.2, BNP 617.  Review of Systems: Review of systems as noted in the HPI. All other systems reviewed and are negative.   Past Medical History:  Diagnosis Date   A-fib ([place])    on Eliquis   AICD (automatic cardioverter/defibrillator) present 10/06/2015   Anemia    Angiodysplasia of small intestine ([place]) 06/10/2020   Ileum - seen on capsule endoscopy 05/2020 - ablated at colonoscopy   ARF (acute renal failure) ([place])     Arthritis    "qwhere" (01/03/2018)   Asthma    reports mild asthma since childhood - had COPD on dx list from prior PCP   Chronic bronchitis ([place])    "get it most years; not this past year though" (01/03/2018)   Chronic kidney disease    "? stage;  followed by [facility]; [name]" (01/03/2018)   Chronic systolic congestive heart failure ([place])    Cirrhosis ([place]) 09/14/2020   dx by Dr Silvano Rusk    COVID-19 05/03/2020   DEPRESSION 12/17/2009   Annotation: PHQ-9 score = 14 done on 12/17/2009 Qualifier: Diagnosis of  By: Hassell Done FNP, [place]     Diabetes mellitus without complication ([place])    DIET CONTROLLED    Diverticulosis    Enteritis    FIBROIDS, UTERUS 03/05/2008   GI bleeding 01/03/2018   Glaucoma    Gout    Hepatitis C    HEPATITIS C - s/p treatment with Harvoni, saw hepatology, Dawn Drazek   History of blood transfusion ~ 11/2017   Hyperlipemia 12/06/2012   HYPERTENSION, BENIGN 04/24/2007   Hypothyroidism    LBBB (left bundle branch block)    Lower GI bleeding    "been dealing w/it since 07/2017" (01/03/2018)   Nonischemic cardiomyopathy ([place])    OBESITY 05/27/2009   OBSTRUCTIVE SLEEP APNEA 11/14/2007   no CPAP   OSTEOPENIA 09/30/2008   Pain and swelling of left upper extremity 08/08/2017   Personal history of other infectious and parasitic disease    Hepatitis B   Pneumonia    "several  times" (01/03/2018)   Pulmonary edema, acute ([place]) 08/09/2017   Past Surgical History:  Procedure Laterality Date   APPENDECTOMY     AV FISTULA PLACEMENT Left 08/05/2020   Procedure: LEFT UPPER EXTREMITY ARTERIOVENOUS (AV) FISTULA CREATION;  Surgeon: Cherre Robins, MD;  Location: [place];  Service: Vascular;  Laterality: Left;   [place] Left 09/16/2020   Procedure: LEFT SECOND STAGE [place];  Surgeon: Cherre Robins, MD;  Location: [place];  Service: Vascular;  Laterality: Left;  PERIPHERAL NERVE BLOCK   CARDIAC CATHETERIZATION     CARDIOVERSION  N/A 12/14/2014   Procedure: CARDIOVERSION;  Surgeon: Dorothy Spark, MD;  Location: [place];  Service: Cardiovascular;  Laterality: N/A;   CARDIOVERSION N/A 02/26/2017   Procedure: CARDIOVERSION;  Surgeon: Evans Lance, MD;  Location: [place] CV LAB;  Service: Cardiovascular;  Laterality: N/A;   CARDIOVERSION N/A 07/24/2017   Procedure: CARDIOVERSION;  Surgeon: Acie Fredrickson Wonda Cheng, MD;  Location: [facility] ENDOSCOPY;  Service: Cardiovascular;  Laterality: N/A;   CARDIOVERSION N/A 02/12/2020   Procedure: CARDIOVERSION;  Surgeon: [name], MD;  Location: [facility] ENDOSCOPY;  Service: Cardiovascular;  Laterality: N/A;   COLONOSCOPY N/A 11/08/2017   Procedure: COLONOSCOPY;  Surgeon: Milus Banister, MD;  Location: [facility] ENDOSCOPY;  Service: Endoscopy;  Laterality: N/A;   COLONOSCOPY WITH PROPOFOL N/A 05/31/2020   Procedure: COLONOSCOPY WITH PROPOFOL;  Surgeon: Irene Shipper, MD;  Location: [facility] ENDOSCOPY;  Service: Endoscopy;  Laterality: N/A;   DILATION AND CURETTAGE OF UTERUS     ENTEROSCOPY N/A 03/02/2020   Procedure: ENTEROSCOPY;  Surgeon: Yetta Flock, MD;  Location: [facility] ENDOSCOPY;  Service: Gastroenterology;  Laterality: N/A;   EP IMPLANTABLE DEVICE N/A 10/06/2015   Procedure: BIV ICD Generator Changeout;  Surgeon: [name], MD;  Location: [place] CV LAB;  Service: Cardiovascular;  Laterality: N/A;   ESOPHAGOGASTRODUODENOSCOPY N/A 10/26/2017   Procedure: ESOPHAGOGASTRODUODENOSCOPY (EGD);  Surgeon: Ladene Artist, MD;  Location: [facility];  Service: Endoscopy;  Laterality: N/A;   ESOPHAGOGASTRODUODENOSCOPY (EGD) WITH PROPOFOL N/A 11/07/2017   Procedure: ESOPHAGOGASTRODUODENOSCOPY (EGD) WITH PROPOFOL;  Surgeon: Milus Banister, MD;  Location: [facility] ENDOSCOPY;  Service: Endoscopy;  Laterality: N/A;   ESOPHAGOGASTRODUODENOSCOPY (EGD) WITH PROPOFOL N/A 05/29/2020   Procedure: ESOPHAGOGASTRODUODENOSCOPY (EGD) WITH PROPOFOL;  Surgeon: Doran Stabler, MD;  Location: [place];   Service: Gastroenterology;  Laterality: N/A;   GIVENS CAPSULE STUDY N/A 05/19/2020   Procedure: GIVENS CAPSULE STUDY;  Surgeon: [name], MD;  Location: [place];  Service: Endoscopy;  Laterality: N/A;  .adm for obs since pacemaker, PA wil enter order and see pt   GIVENS CAPSULE STUDY N/A 05/29/2020   Procedure: GIVENS CAPSULE STUDY;  Surgeon: Doran Stabler, MD;  Location: [place];  Service: Gastroenterology;  Laterality: N/A;   HEMATOMA EVACUATION Left 09/24/2020   Procedure: EVACUATION HEMATOMA ARM;  Surgeon: Rosetta Posner, MD;  Location: [facility] OR;  Service: Vascular;  Laterality: Left;   HOT HEMOSTASIS N/A 10/26/2017   Procedure: HOT HEMOSTASIS (ARGON PLASMA COAGULATION/BICAP);  Surgeon: Ladene Artist, MD;  Location: [facility];  Service: Endoscopy;  Laterality: N/A;   HOT HEMOSTASIS N/A 03/02/2020   Procedure: HOT HEMOSTASIS (ARGON PLASMA COAGULATION/BICAP);  Surgeon: Yetta Flock, MD;  Location: [facility] ENDOSCOPY;  Service: Gastroenterology;  Laterality: N/A;   HOT HEMOSTASIS N/A 05/31/2020   Procedure: HOT HEMOSTASIS (ARGON PLASMA COAGULATION/BICAP);  Surgeon: Irene Shipper, MD;  Location: [facility] ENDOSCOPY;  Service: Endoscopy;  Laterality:  N/A;   INSERT / REPLACE / REMOVE PACEMAKER  ?2008   IR PERC TUN PERIT CATH WO PORT S&I /IMAG  07/05/2020   POLYPECTOMY  11/08/2017   Procedure: POLYPECTOMY;  Surgeon: Milus Banister, MD;  Location: [facility] ENDOSCOPY;  Service: Endoscopy;;   PROCTOSCOPY N/A 05/22/2018   Procedure: RIGID PROCTOSCOPY;  Surgeon: Michael Boston, MD;  Location: [facility] ORS;  Service: General;  Laterality: N/A;   RIGHT HEART CATH N/A 02/13/2018   Procedure: RIGHT HEART CATH;  Surgeon: [name], MD;  Location: [place] CV LAB;  Service: Cardiovascular;  Laterality: N/A;   RIGHT HEART CATH N/A 06/23/2020   Procedure: RIGHT HEART CATH;  Surgeon: [name], MD;  Location: [place] CV LAB;  Service: Cardiovascular;  Laterality: N/A;   RIGHT/LEFT HEART CATH  AND CORONARY ANGIOGRAPHY N/A 12/11/2019   Procedure: RIGHT/LEFT HEART CATH AND CORONARY ANGIOGRAPHY;  Surgeon: [name], MD;  Location: [place] CV LAB;  Service: Cardiovascular;  Laterality: N/A;   SUBMUCOSAL TATTOO INJECTION  03/02/2020   Procedure: SUBMUCOSAL TATTOO INJECTION;  Surgeon: Yetta Flock, MD;  Location: [facility] ENDOSCOPY;  Service: Gastroenterology;;   SUBMUCOSAL TATTOO INJECTION  05/31/2020   Procedure: SUBMUCOSAL TATTOO INJECTION;  Surgeon: Irene Shipper, MD;  Location: [facility] ENDOSCOPY;  Service: Endoscopy;;   TUBAL LIGATION     XI ROBOTIC ASSISTED LOWER ANTERIOR RESECTION N/A 05/22/2018   Procedure: XI ROBOTIC ASSISTED SIGMOID COLOECTOMY MOBILIZATION OF SPLENIC FLEXURE, FIREFLY ASSESSMENT OF PERFUSION;  Surgeon: Michael Boston, MD;  Location: [facility] ORS;  Service: General;  Laterality: N/A;  ERAS PATHWAY    Social History:  reports that she has never smoked. She has never used smokeless tobacco. She reports previous alcohol use. She reports previous drug use. Drug: Marijuana.   No Known Allergies  Family History  Problem Relation Age of Onset   Asthma Father    Heart attack Father    Asthma Sister    Lung cancer Sister    Heart attack Mother    Stroke Brother    Colon cancer Neg Hx    Esophageal cancer Neg Hx    Pancreatic cancer Neg Hx    Stomach cancer Neg Hx    Liver disease Neg Hx       Prior to Admission medications   Medication Sig Start Date End Date Taking? Authorizing Provider  amiodarone (PACERONE) 200 MG tablet Take 1 tablet (200 mg total) by mouth daily. 07/27/20  Yes Clegg, [name] D, NP  AURYXIA 1 GM 210 MG(Fe) tablet Take 420 mg by mouth 3 (three) times daily with meals. 08/31/20  Yes [provider]  carvedilol (COREG) 3.125 MG tablet Take 1 tablet (3.125 mg total) by mouth 2 (two) times daily with a meal. 07/12/20  Yes Lyda Jester M, PA-C  cromolyn (OPTICROM) 4 % ophthalmic solution Place 1 drop into both eyes 2 (two) times daily.  06/14/20  Yes [provider]  diclofenac Sodium (VOLTAREN) 1 % GEL Apply 1 application topically daily. 11/11/20  Yes [provider]  gabapentin (NEURONTIN) 300 MG capsule Take 1 capsule (300 mg total) by mouth daily. 07/12/20  Yes Lyda Jester M, PA-C  HYDROcodone-acetaminophen (NORCO/VICODIN) 5-325 MG tablet Take 1 tablet by mouth 3 (three) times daily as needed for pain. 10/29/20  Yes [provider]  isosorbide-hydrALAZINE (BIDIL) 20-37.5 MG tablet Take 0.5 tablets by mouth 3 (three) times daily. 10/21/20  Yes [name], MD  levothyroxine (SYNTHROID) 137 MCG tablet Take 1 tablet (137  mcg total) by mouth daily before breakfast. 03/11/20  Yes Billie Ruddy, MD    Physical Exam: BP 123/79   Pulse (!) 125   Temp 97.7 F (36.5 C) (Oral)   Resp (!) 9   Ht 4\' 11"  (1.499 m)   Wt 59 kg   SpO2 95%   BMI 26.26 kg/m   General: 75 y.o. year-old female well developed well nourished in no acute distress.  Alert and oriented x3. Cardiovascular: Irregular rate and rhythm with no rubs or gallops.  No thyromegaly or JVD noted.  No lower extremity edema. 2/4 pulses in all 4 extremities. Respiratory: Clear to auscultation with no wheezes or rales. Good inspiratory effort. Abdomen: Soft nontender nondistended with normal bowel sounds x4 quadrants. Muskuloskeletal: No cyanosis, clubbing or edema noted bilaterally Neuro: CN II-XII intact, strength, sensation, reflexes Skin: No ulcerative lesions noted or rashes Psychiatry: Judgement and insight appear normal. Mood is appropriate for condition and setting          Labs on Admission:  Basic Metabolic Panel: Recent Labs  Lab 11/17/20 1634  NA 135  K 3.6  CL 94*  CO2 32  GLUCOSE 99  BUN 11  CREATININE 2.21*  CALCIUM 9.8  MG 2.2  PHOS 2.0*   Liver Function Tests: No results for input(s): AST, ALT, ALKPHOS, BILITOT, PROT, ALBUMIN in the last 168 hours. No results for input(s): LIPASE, AMYLASE in the last 168  hours. No results for input(s): AMMONIA in the last 168 hours. CBC: Recent Labs  Lab 11/17/20 1634  WBC 4.7  NEUTROABS 2.3  HGB 11.9*  HCT 37.4  MCV 95.9  PLT 126*   Cardiac Enzymes: No results for input(s): CKTOTAL, CKMB, CKMBINDEX, TROPONINI in the last 168 hours.  BNP (last 3 results) Recent Labs    06/21/20 1501 06/23/20 1224 11/17/20 1634  BNP 981.7* 1,099.2* 617.5*    ProBNP (last 3 results) No results for input(s): PROBNP in the last 8760 hours.  CBG: No results for input(s): GLUCAP in the last 168 hours.  Radiological Exams on Admission: DG Chest Port 1 View  Result Date: 11/17/2020 CLINICAL DATA:  Tachycardia EXAM: PORTABLE CHEST 1 VIEW COMPARISON:  Sep 25, 2020 FINDINGS: Right IJ dialysis catheter with tip overlying the right atrium. Stable cardiomegaly. Central vascular prominence. Aortic atherosclerosis. Mild bibasilar interstitial opacities. No visible pleural effusion. The visualized skeletal structures are unremarkable. IMPRESSION: Cardiomegaly with central vascular prominence and mild bibasilar interstitial opacities, likely mild edema. Electronically Signed   By: Dahlia Bailiff MD   On: 11/17/2020 16:55    EKG: I independently viewed the EKG done and my findings are as followed: A. fib with rate of 121.  Nonspecific ST-T changes.  QTc 589.  Assessment/Plan Present on Admission:  Atrial fibrillation with RVR (HCC)  Active Problems:   Atrial fibrillation with RVR (HCC)  Chronic A. fib with RVR In the setting of chronic A. fib. She is on amiodarone and coreg however does not take AM meds on HD days. Sent from hemodialysis due to heart rate in the 120s-180s. She has received amiodarone bolus and is currently on diltiazem drip at cardiology's recommendation. She is not on anticoagulation due to history of GI bleeds. Continue diltiazem drip. Restart her home Coreg 3.125 mg twice daily and amiodarone 200 mg daily. Closely monitor on  telemetry. Cardiology has been consulted by EDP.  ESRD on HD MWF Sent from hemodialysis, she states they removed 2.5L of volume. Electrolytes are currently stable. If switched to  inpatient status will need nephrology consultation for possible hemodialysis while hospitalized.  Prolonged QTC Admission 12 EKG showed QTC 589 Avoid QTC prolonging agents Optimize magnesium and potassium levels Electrolytes are managed with hemodialysis Repeat twelve-lead EKG in the morning.  Mild hypophosphatemia Serum phos 2.0 Managed with HD  Thombocytopenia, non specific Platelet count 126 Monitor  Anemia of chronic disease/History of GI bleed Stable with Hg 11.9  Hypothyroidism Resume home levothyroxine  Polyneuropathy Resume home gabapentin  Chronic diastolic CHF Last 2D echo done on 06/24/2020 revealed LVEF 55 to 60% with grade 2 diastolic dysfunction.  The left and right atria were severely dilated.  Tricuspid valve regurgitation was severe.  Aortic valve regurgitation was moderate Volume status and electrolytes are managed with hemodialysis.   DVT prophylaxis: Subcu heparin 3 times daily.  Code Status: Full code  Family Communication: None at bedside  Disposition Plan: Admit to telemetry cardiac unit.  Consults called: Cardiology consulted by EDP.  Admission status: Observation status.   Status is: Observation    Dispo:  Patient From: Home  Planned Disposition: Home, possibly on 11/18/2020 when heart rate is controlled or when cardiology signs off.  Medically stable for discharge: No      Kayleen Memos MD [facility] Pager [phone]  If 7PM-7AM, please contact night-coverage [email or web address] Password [id number]  11/17/2020, 8:00 PM

## 2020-11-17 NOTE — ED Triage Notes (Signed)
Patient was at Dialysis when they determined HR 130.  Patient states she only lacked 49minutes of her treatment.  Denies chest pain, SOB, no nausea or vomiting.

## 2020-11-17 NOTE — Progress Notes (Signed)
Patient was seen by cardiology, her rate is now controlled.  Per cardiology she is okay to be discharged from the ED.  Patient wants to go home.  She states she feels fine.

## 2020-11-17 NOTE — ED Notes (Signed)
Paged cardiology 

## 2020-11-18 ENCOUNTER — Ambulatory Visit (INDEPENDENT_AMBULATORY_CARE_PROVIDER_SITE_OTHER): Payer: Medicare Other | Admitting: Medical

## 2020-11-18 VITALS — BP 110/62 | HR 65 | Resp 18 | Ht 59.0 in | Wt 133.0 lb

## 2020-11-18 DIAGNOSIS — I152 Hypertension secondary to endocrine disorders: Secondary | ICD-10-CM

## 2020-11-18 DIAGNOSIS — N186 End stage renal disease: Secondary | ICD-10-CM

## 2020-11-18 DIAGNOSIS — R7989 Other specified abnormal findings of blood chemistry: Secondary | ICD-10-CM | POA: Diagnosis not present

## 2020-11-18 DIAGNOSIS — I4891 Unspecified atrial fibrillation: Secondary | ICD-10-CM

## 2020-11-18 DIAGNOSIS — E1159 Type 2 diabetes mellitus with other circulatory complications: Secondary | ICD-10-CM | POA: Diagnosis not present

## 2020-11-18 NOTE — Progress Notes (Signed)
Subjective:    Patient ID: Morgan Clay, female    DOB: 1945-05-13, 75 y.o.   MRN: [id number]  HPI  Pt in for follow up from the ED. Seen yesterday.   Arrival date & time: 11/17/20  1554     History     Chief Complaint  Patient presents with   Tachycardia      VPaced at [place] is a 75 y.o. female.   HPI     75 year old female with history of A. fib not on any anticoagulation, CHF, AICD placement comes in with chief complaint of abnormal heart rhythm.  Patient also has history of ESRD on HD.  She reports that she was feeling fine, and during dialysis started having some palpitations.  The dialysis center noted that her heart rate was over 120 and called EMS.  Patient had most of her hemodialysis completed at that time.   She did miss her amiodarone today, but in general has been regular. She denies any new medications.  Review of system is negative for any nausea, vomiting, fevers, chills, cough, chest pain, shortness of breath, near fainting spells or dizziness.  Review of Systems in ED. Constitutional:  Negative for activity change. Respiratory:  Negative for shortness of breath.   Cardiovascular:  Positive for palpitations. Negative for chest pain. Gastrointestinal:  Negative for nausea and vomiting. Neurological:  Negative for dizziness. Hematological:  Does not bruise/bleed easily.  All other systems reviewed and are negative.   Critical care provider statement:   Critical care time (minutes):  48   Critical care was necessary to treat or prevent imminent or life-threatening deterioration of the following conditions:  Cardiac failure and circulatory failure   Critical care was time spent personally by me on the following activities:  Discussions with consultants, evaluation of patient's response to treatment, examination of patient, ordering and performing treatments and interventions, ordering and review of laboratory studies, ordering and  review of radiographic studies, pulse oximetry, re-evaluation of patient's condition, obtaining history from patient or surrogate and review of old charts   I assumed direction of critical care for this patient from another provider in my specialty: yes    A/P in ED. 61-year-old female comes in with chief complaint of elevated heart rate.  She has history of A. fib, ESRD, CHF, AICD placement.   Heart rate at 130s.  Appears to be irregularly irregular on pulse check.  Likely she is having A. fib with RVR.  She does indicate not taking her amnio earlier today, but otherwise is compliant with her medications.  Review of system and history is not indicative of underlying infectious process.   I discussed the case with cardiology service.  She is on amiodarone normally and carvedilol.  Our approach is going to be to give her amiodarone bolus, if she does not respond then initiate diltiazem drip and admit her.   7:45 PM Did not respond to amnio bolus.  We will start diltiazem drip and proceed with admission request.   9:24 PM Patient converted to sinus rhythm.  I consulted cardiology again.  They have seen the patient and are recommending discharging the patient home.  Medicine team has been informed as they had already put in admission orders.  She is stable for discharge from our side.   Pt since ED evaluation feels fine no cardiac signs or symptoms.  Pt will call cardilogist today to schedule follow up as well.  Review of Systems  Constitutional:  Negative for chills, fatigue and fever.  HENT:  Negative for congestion, ear discharge, ear pain and facial swelling.   Respiratory:  Negative for cough, choking, chest tightness and wheezing.   Cardiovascular:  Negative for chest pain and palpitations.  Gastrointestinal:  Negative for abdominal pain.  Genitourinary:  Negative for difficulty urinating, dysuria, enuresis, flank pain and frequency.  Musculoskeletal:  Negative for back pain.   Skin:  Negative for rash.    Past Medical History:  Diagnosis Date   A-fib ([place])    on Eliquis   AICD (automatic cardioverter/defibrillator) present 10/06/2015   Anemia    Angiodysplasia of small intestine ([place]) 06/10/2020   Ileum - seen on capsule endoscopy 05/2020 - ablated at colonoscopy   ARF (acute renal failure) ([place])    Arthritis    "qwhere" (01/03/2018)   Asthma    reports mild asthma since childhood - had COPD on dx list from prior PCP   Chronic bronchitis ([place])    "get it most years; not this past year though" (01/03/2018)   Chronic kidney disease    "? stage;  followed by [facility]; [name]" (01/03/2018)   Chronic systolic congestive heart failure ([place])    Cirrhosis ([place]) 09/14/2020   dx by Dr Silvano Rusk    COVID-19 05/03/2020   DEPRESSION 12/17/2009   Annotation: PHQ-9 score = 14 done on 12/17/2009 Qualifier: Diagnosis of  By: Hassell Done FNP, [place]     Diabetes mellitus without complication ([place])    DIET CONTROLLED    Diverticulosis    Enteritis    FIBROIDS, UTERUS 03/05/2008   GI bleeding 01/03/2018   Glaucoma    Gout    Hepatitis C    HEPATITIS C - s/p treatment with Harvoni, saw hepatology, Dawn Drazek   History of blood transfusion ~ 11/2017   Hyperlipemia 12/06/2012   HYPERTENSION, BENIGN 04/24/2007   Hypothyroidism    LBBB (left bundle branch block)    Lower GI bleeding    "been dealing w/it since 07/2017" (01/03/2018)   Nonischemic cardiomyopathy ([place])    OBESITY 05/27/2009   OBSTRUCTIVE SLEEP APNEA 11/14/2007   no CPAP   OSTEOPENIA 09/30/2008   Pain and swelling of left upper extremity 08/08/2017   Personal history of other infectious and parasitic disease    Hepatitis B   Pneumonia    "several times" (01/03/2018)   Pulmonary edema, acute ([place]) 08/09/2017     Social History   Socioeconomic History   Marital status: Divorced    Spouse name: Not on file   Number of children: 1   Years of education: 12   Highest education level: Not on file   Occupational History   Not on file  Tobacco Use   Smoking status: Never   Smokeless tobacco: Never  Vaping Use   Vaping Use: Never used  Substance and Sexual Activity   Alcohol use: Not Currently    Comment: 01/03/2018 "couple drinks/month"   Drug use: Not Currently    Types: Marijuana    Comment: VERY INTERMITTENT    Sexual activity: Not Currently  Other Topics Concern   Not on file  Social History Narrative   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      Home Situation: lives alone      Spiritual Beliefs: Christian - [employer]      Lifestyle: no regular exercise; poor diet      She is divorced at least one adult child  Never smoker    rare alcohol to occasional at best    no substance abuse         Social Determinants of Radio broadcast assistant Strain: Low Risk    Difficulty of Paying Living Expenses: Not hard at all  Food Insecurity: No Food Insecurity   Worried About Charity fundraiser in the Last Year: Never true   [place] in the Last Year: Never true  Transportation Needs: No Transportation Needs   Lack of Transportation (Medical): No   Lack of Transportation (Non-Medical): No  Physical Activity: Not on file  Stress: Not on file  Social Connections: Not on file  Intimate Partner Violence: Not on file    Past Surgical History:  Procedure Laterality Date   APPENDECTOMY     AV FISTULA PLACEMENT Left 08/05/2020   Procedure: LEFT UPPER EXTREMITY ARTERIOVENOUS (AV) FISTULA CREATION;  Surgeon: Cherre Robins, MD;  Location: [place];  Service: Vascular;  Laterality: Left;   [place] Left 09/16/2020   Procedure: LEFT SECOND STAGE [place];  Surgeon: Cherre Robins, MD;  Location: [place];  Service: Vascular;  Laterality: Left;  [place] N/A 12/14/2014   Procedure: CARDIOVERSION;  Surgeon: Dorothy Spark, MD;  Location: [place];  Service:  Cardiovascular;  Laterality: N/A;   CARDIOVERSION N/A 02/26/2017   Procedure: CARDIOVERSION;  Surgeon: Evans Lance, MD;  Location: [place] [facility];  Service: Cardiovascular;  Laterality: N/A;   CARDIOVERSION N/A 07/24/2017   Procedure: CARDIOVERSION;  Surgeon: Acie Fredrickson Wonda Cheng, MD;  Location: [facility] ENDOSCOPY;  Service: Cardiovascular;  Laterality: N/A;   CARDIOVERSION N/A 02/12/2020   Procedure: CARDIOVERSION;  Surgeon: [name], MD;  Location[facility]COPY;  Service: Cardiovascular;  Laterality: N/A;   COLONOSCOPY N/A 11/08/2017   Procedure: COLONOSCOPY;  Surgeon: Milus Banister, MD;  Location: [facility] ENDOSCOPY;  Service: Endoscopy;  Laterality: N/A;   COLONOSCOPY WITH PROPOFOL N/A 05/31/2020   Procedure: COLONOSCOPY WITH PROPOFOL;  Surgeon: Irene Shipper, MD;  Location: [facility] ENDOSCOPY;  Service: Endoscopy;  Laterality: N/A;   DILATION AND CURETTAGE OF UTERUS     ENTEROSCOPY N/A 03/02/2020   Procedure: ENTEROSCOPY;  Surgeon: Yetta Flock, MD;  Location: [facility] ENDOSCOPY;  Service: Gastroenterology;  Laterality: N/A;   EP IMPLANTABLE DEVICE N/A 10/06/2015   Procedure: BIV ICD Generator Changeout;  Surgeon: [name], MD;  Location: [place] CV LAB;  Service: Cardiovascular;  Laterality: N/A;   ESOPHAGOGASTRODUODENOSCOPY N/A 10/26/2017   Procedure: ESOPHAGOGASTRODUODENOSCOPY (EGD);  Surgeon: Ladene Artist, MD;  Location: [facility];  Service: Endoscopy;  Laterality: N/A;   ESOPHAGOGASTRODUODENOSCOPY (EGD) WITH PROPOFOL N/A 11/07/2017   Procedure: ESOPHAGOGASTRODUODENOSCOPY (EGD) WITH PROPOFOL;  Surgeon: [place]us Banister, MD;  Location: [facility] ENDOSCOPY;  Service: Endoscopy;  Laterality: N/A;   ESOPHAGOGASTRODUODENOSCOPY (EGD) WITH PROPOFOL N/A 05/29/2020   Procedure: ESOPHAGOGASTRODUODENOSCOPY (EGD) WITH PROPOFOL;  Surgeon: Doran Stabler, MD;  Location: [place];  Service: Gastroenterology;  Laterality: N/A;   GIVENS CAPSULE STUDY N/A 05/19/2020   Procedure: GIVENS CAPSULE  STUDY;  Surgeon: [name], MD;  Location: [place];  Service: Endoscopy;  Laterality: N/A;  .adm for obs since pacemaker, PA wil enter order and see pt   GIVENS CAPSULE STUDY N/A 05/29/2020   Procedure: GIVENS CAPSULE STUDY;  Surgeon: Doran Stabler, MD;  Location: [place];  Service: Gastroenterology;  Laterality: N/A;  HEMATOMA EVACUATION Left 09/24/2020   Procedure: EVACUATION HEMATOMA ARM;  Surgeon: Rosetta Posner, MD;  Location: [facility] OR;  Service: Vascular;  Laterality: Left;   HOT HEMOSTASIS N/A 10/26/2017   Procedure: HOT HEMOSTASIS (ARGON PLASMA COAGULATION/BICAP);  Surgeon: Ladene Artist, MD;  Location: [facility];  Service: Endoscopy;  Laterality: N/A;   HOT HEMOSTASIS N/A 03/02/2020   Procedure: HOT HEMOSTASIS (ARGON PLASMA COAGULATION/BICAP);  Surgeon: Yetta Flock, MD;  Location: [facility] ENDOSCOPY;  Service: Gastroenterology;  Laterality: N/A;   HOT HEMOSTASIS N/A 05/31/2020   Procedure: HOT HEMOSTASIS (ARGON PLASMA COAGULATION/BICAP);  Surgeon: Irene Shipper, MD;  Location: [facility] ENDOSCOPY;  Service: Endoscopy;  Laterality: N/A;   INSERT / REPLACE / REMOVE PACEMAKER  ?2008   IR PERC TUN PERIT CATH WO PORT S&I /IMAG  07/05/2020   POLYPECTOMY  11/08/2017   Procedure: POLYPECTOMY;  Surgeon: Milus Banister, MD;  Location: [facility] ENDOSCOPY;  Service: Endoscopy;;   PROCTOSCOPY N/A 05/22/2018   Procedure: RIGID PROCTOSCOPY;  Surgeon: Michael Boston, MD;  Location: [facility] ORS;  Service: General;  Laterality: N/A;   RIGHT HEART CATH N/A 02/13/2018   Procedure: RIGHT HEART CATH;  Surgeon: [name], MD;  Location: [place] CV LAB;  Service: Cardiovascular;  Laterality: N/A;   RIGHT HEART CATH N/A 06/23/2020   Procedure: RIGHT HEART CATH;  Surgeon: [name], MD;  Location: [place] CV LAB;  Service: Cardiovascular;  Laterality: N/A;   RIGHT/LEFT HEART CATH AND CORONARY ANGIOGRAPHY N/A 12/11/2019   Procedure: RIGHT/LEFT HEART CATH AND CORONARY ANGIOGRAPHY;  Surgeon:  [name], MD;  Location: [place] CV LAB;  Service: Cardiovascular;  Laterality: N/A;   SUBMUCOSAL TATTOO INJECTION  03/02/2020   Procedure: SUBMUCOSAL TATTOO INJECTION;  Surgeon: Yetta Flock, MD;  Location: [facility] ENDOSCOPY;  Service: Gastroenterology;;   SUBMUCOSAL TATTOO INJECTION  05/31/2020   Procedure: SUBMUCOSAL TATTOO INJECTION;  Surgeon: Irene Shipper, MD;  Location: [facility] ENDOSCOPY;  Service: Endoscopy;;   TUBAL LIGATION     XI ROBOTIC ASSISTED LOWER ANTERIOR RESECTION N/A 05/22/2018   Procedure: XI ROBOTIC ASSISTED SIGMOID COLOECTOMY MOBILIZATION OF SPLENIC FLEXURE, FIREFLY ASSESSMENT OF PERFUSION;  Surgeon: Michael Boston, MD;  Location: [facility] ORS;  Service: General;  Laterality: N/A;  ERAS PATHWAY    Family History  Problem Relation Age of Onset   Asthma Father    Heart attack Father    Asthma Sister    Lung cancer Sister    Heart attack Mother    Stroke Brother    Colon cancer Neg Hx    Esophageal cancer Neg Hx    Pancreatic cancer Neg Hx    Stomach cancer Neg Hx    Liver disease Neg Hx     No Known Allergies  Current Outpatient Medications on File Prior to Visit  Medication Sig Dispense Refill   amiodarone (PACERONE) 200 MG tablet Take 1 tablet (200 mg total) by mouth daily. 90 tablet 3   AURYXIA 1 GM 210 MG(Fe) tablet Take 420 mg by mouth 3 (three) times daily with meals.     carvedilol (COREG) 3.125 MG tablet Take 1 tablet (3.125 mg total) by mouth 2 (two) times daily with a meal. 60 tablet 5   cromolyn (OPTICROM) 4 % ophthalmic solution Place 1 drop into both eyes 2 (two) times daily.     diclofenac Sodium (VOLTAREN) 1 % GEL Apply 1 application topically daily.     gabapentin (NEURONTIN) 300 MG capsule Take 1 capsule (300 mg total)  by mouth daily. 180 capsule 2   HYDROcodone-acetaminophen (NORCO/VICODIN) 5-325 MG tablet Take 1 tablet by mouth 3 (three) times daily as needed for pain.     isosorbide-hydrALAZINE (BIDIL) 20-37.5 MG tablet Take 0.5 tablets by  mouth 3 (three) times daily. 126 tablet 5   levothyroxine (SYNTHROID) 137 MCG tablet Take 1 tablet (137 mcg total) by mouth daily before breakfast. 90 tablet 3   No current facility-administered medications on file prior to visit.    BP 110/62   Pulse 65   Resp 18   Ht 4\' 11"  (1.499 m)   Wt 133 lb (60.3 kg)   SpO2 100%   BMI 26.86 kg/m        Objective:   Physical Exam  General Mental Status- Alert. General Appearance- Not in acute distress.   Skin General: Color- Normal Color. Moisture- Normal Moisture.  Neck Carotid Arteries- Normal color. Moisture- Normal Moisture. No carotid bruits. No JVD.  Chest and Lung Exam Auscultation: Breath Sounds:-Normal.  Cardiovascular Auscultation:Rythm- Regular. Murmurs & Other Heart Sounds:Auscultation of the heart reveals- No Murmurs.  Abdomen Inspection:-Inspeection Normal. Palpation/Percussion:Note:No mass. Palpation and Percussion of the abdomen reveal- Non Tender, Non Distended + BS, no rebound or guarding.    Neurologic Cranial Nerve exam:- CN III-XII intact(No nystagmus), symmetric smile. Strength:- 5/5 equal and symmetric strength both upper and lower extremities.   Lower ext- no pedal edema. Negative homans sign.    Assessment & Plan:  Event of RVR atrial fibrillation after missed amiodorone dose. Treated in ED and now rate controlled and clinically stable. Continue amiodorone and carvedilol.   Go ahead and call cardiologist for follow up as planned.  For ESRD continue with dialysis.  Hx of htn. Bp well controlled.  For chronic left arm pain pain management has you on voltraen gel and hydrocodone.  Follow up 4-6 weeks or as needed  [employer], PA-C

## 2020-11-18 NOTE — Patient Instructions (Addendum)
Event of RVR atrial fibrillation after missed amiodarone dose. Treated in ED and now rate controlled and clinically stable. Continue amiodarone and carvedilol.   Go ahead and call cardiologist for follow up as planned.  For ESRD continue with dialysis.  Hx of htn. Bp well controlled.  For chronic left arm pain pain management has you on voltraen gel and hydrocodone.  Recent elevated tsh but normal t4. On thyroid med. Will repeat labs to verify. Modify dose or refer to endocrinologist depending on results.  Follow up 4-6 weeks or as needed

## 2020-11-23 NOTE — Progress Notes (Signed)
Remote ICD transmission.   

## 2020-12-16 ENCOUNTER — Other Ambulatory Visit: Payer: Self-pay

## 2020-12-16 ENCOUNTER — Ambulatory Visit (INDEPENDENT_AMBULATORY_CARE_PROVIDER_SITE_OTHER): Payer: Medicare Other | Admitting: Medical

## 2020-12-16 VITALS — BP 133/60 | HR 66 | Resp 18 | Ht 59.0 in | Wt 132.0 lb

## 2020-12-16 DIAGNOSIS — E1159 Type 2 diabetes mellitus with other circulatory complications: Secondary | ICD-10-CM

## 2020-12-16 DIAGNOSIS — I4891 Unspecified atrial fibrillation: Secondary | ICD-10-CM | POA: Diagnosis not present

## 2020-12-16 DIAGNOSIS — R7989 Other specified abnormal findings of blood chemistry: Secondary | ICD-10-CM | POA: Diagnosis not present

## 2020-12-16 DIAGNOSIS — N186 End stage renal disease: Secondary | ICD-10-CM | POA: Diagnosis not present

## 2020-12-16 DIAGNOSIS — I152 Hypertension secondary to endocrine disorders: Secondary | ICD-10-CM

## 2020-12-16 LAB — T4, FREE: Free T4: 1.59 ng/dL (ref 0.60–1.60)

## 2020-12-16 LAB — TSH: TSH: 6.06 u[IU]/mL — ABNORMAL HIGH (ref 0.35–5.50)

## 2020-12-16 MED ORDER — TRAZODONE HCL 50 MG PO TABS: 25.0000 mg | ORAL_TABLET | Freq: Every evening | ORAL | 3 refills | Status: DC | PRN

## 2020-12-16 NOTE — Addendum Note (Signed)
Addended by: Kelle Darting A on: 12/16/2020 10:54 AM   Modules accepted: Orders

## 2020-12-16 NOTE — Patient Instructions (Addendum)
Hx of atrial fibrillation. Controlled with amiodorone and coreg. Rate controlled today. Stable clinically. Put in referral back to cardiologist as on review appears that was intention on finding referral that had been placed. Placed new referral.   Htn- bp well controlled today.   Hx of diabetes but well controlled on a1c review with diet alone.  For ESRD continue with dialysis.  For arm pain continue current regimen. Glad to hear pain is improving  For insomnia refilled trazadone.  For elevated tsh repeat tsh and t4 today.  Follow up in 4-6 weeks or sooner if needed.

## 2020-12-16 NOTE — Addendum Note (Signed)
Addended by: Kelle Darting A on: 12/16/2020 10:53 AM   Modules accepted: Orders

## 2020-12-16 NOTE — Progress Notes (Addendum)
Subjective:    Patient ID: Morgan Clay, female    DOB: Mar 24, 1946, 75 y.o.   MRN: [id number]  HPI  Pt in for follow up.  Since last visit she has followed up.  Pt states has not seen cardiologist recently. In epic there was referral placed on 11/29/2020. Not sure why they did not all set up.  Pt has not had any chest pain or palpitations.  Pt is getting dialysis weekly for ESRD.  Pt has chronic left arm pain. She states pain is much better now. Pt is using voltaren gel rarely. Now using hydrocodone about 1 tab every 3rd day.  Hx of htn. Bp well controlled today.  Pt had elevated tsh recently. Will get tsh and t4 today.   Hx of insomnia. Treated with trazadone.    Review of Systems  Constitutional:  Negative for chills, fatigue and fever.  HENT:  Negative for congestion.   Respiratory:  Negative for cough, chest tightness, shortness of breath and wheezing.   Cardiovascular:  Negative for chest pain and palpitations.  Gastrointestinal:  Negative for abdominal pain, constipation, diarrhea and nausea.  Genitourinary:  Negative for dysuria.  Musculoskeletal:  Negative for back pain and myalgias.  Skin:  Negative for rash.  Neurological:  Negative for dizziness, seizures, numbness and headaches.  Hematological:  Negative for adenopathy. Does not bruise/bleed easily.  Psychiatric/Behavioral:  Positive for sleep disturbance. Negative for agitation, confusion, dysphoric mood and suicidal ideas. The patient is not nervous/anxious.     Past Medical History:  Diagnosis Date   A-fib ([place])    on Eliquis   AICD (automatic cardioverter/defibrillator) present 10/06/2015   Anemia    Angiodysplasia of small intestine ([place]) 06/10/2020   Ileum - seen on capsule endoscopy 05/2020 - ablated at colonoscopy   ARF (acute renal failure) ([place])    Arthritis    "qwhere" (01/03/2018)   Asthma    reports mild asthma since childhood - had COPD on dx list from prior PCP   Chronic bronchitis  ([place])    "get it most years; not this past year though" (01/03/2018)   Chronic kidney disease    "? stage;  followed by [facility]; [name]" (01/03/2018)   Chronic systolic congestive heart failure ([place])    Cirrhosis ([place]) 09/14/2020   dx by Dr Silvano Rusk    COVID-19 05/03/2020   DEPRESSION 12/17/2009   Annotation: PHQ-9 score = 14 done on 12/17/2009 Qualifier: Diagnosis of  By: Hassell Done FNP, [place]     Diabetes mellitus without complication ([place])    DIET CONTROLLED    Diverticulosis    Enteritis    FIBROIDS, UTERUS 03/05/2008   GI bleeding 01/03/2018   Glaucoma    Gout    Hepatitis C    HEPATITIS C - s/p treatment with Harvoni, saw hepatology, Dawn Drazek   History of blood transfusion ~ 11/2017   Hyperlipemia 12/06/2012   HYPERTENSION, BENIGN 04/24/2007   Hypothyroidism    LBBB (left bundle branch block)    Lower GI bleeding    "been dealing w/it since 07/2017" (01/03/2018)   Nonischemic cardiomyopathy ([place])    OBESITY 05/27/2009   OBSTRUCTIVE SLEEP APNEA 11/14/2007   no CPAP   OSTEOPENIA 09/30/2008   Pain and swelling of left upper extremity 08/08/2017   Personal history of other infectious and parasitic disease    Hepatitis B   Pneumonia    "several times" (01/03/2018)   Pulmonary edema, acute ([place]) 08/09/2017     Social History  Socioeconomic History   Marital status: Divorced    Spouse name: Not on file   Number of children: 1   Years of education: 63   Highest education level: Not on file  Occupational History   Not on file  Tobacco Use   Smoking status: Never   Smokeless tobacco: Never  Vaping Use   Vaping Use: Never used  Substance and Sexual Activity   Alcohol use: Not Currently    Comment: 01/03/2018 "couple drinks/month"   Drug use: Not Currently    Types: Marijuana    Comment: VERY INTERMITTENT    Sexual activity: Not Currently  Other Topics Concern   Not on file  Social History Narrative   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      Home  Situation: lives alone      Spiritual Beliefs: Christian - [employer]      Lifestyle: no regular exercise; poor diet      She is divorced at least one adult child   Never smoker    rare alcohol to occasional at best    no substance abuse         Social Determinants of Radio broadcast assistant Strain: Low Risk    Difficulty of Paying Living Expenses: Not hard at all  Food Insecurity: No Food Insecurity   Worried About Charity fundraiser in the Last Year: Never true   [place] in the Last Year: Never true  Transportation Needs: No Transportation Needs   Lack of Transportation (Medical): No   Lack of Transportation (Non-Medical): No  Physical Activity: Not on file  Stress: Not on file  Social Connections: Not on file  Intimate Partner Violence: Not on file    Past Surgical History:  Procedure Laterality Date   APPENDECTOMY     AV FISTULA PLACEMENT Left 08/05/2020   Procedure: LEFT UPPER EXTREMITY ARTERIOVENOUS (AV) FISTULA CREATION;  Surgeon: Cherre Robins, MD;  Location: [place];  Service: Vascular;  Laterality: Left;   [place] Left 09/16/2020   Procedure: LEFT SECOND STAGE [place];  Surgeon: Cherre Robins, MD;  Location: [place];  Service: Vascular;  Laterality: Left;  [place] N/A 12/14/2014   Procedure: CARDIOVERSION;  Surgeon: Dorothy Spark, MD;  Location: [place];  Service: Cardiovascular;  Laterality: N/A;   CARDIOVERSION N/A 02/26/2017   Procedure: CARDIOVERSION;  Surgeon: Evans Lance, MD;  Location: [place] [facility];  Service: Cardiovascular;  Laterality: N/A;   CARDIOVERSION N/A 07/24/2017   Procedure: CARDIOVERSION;  Surgeon: Acie Fredrickson Wonda Cheng, MD;  Location: [facility] ENDOSCOPY;  Service: Cardiovascular;  Laterality: N/A;   CARDIOVERSION N/A 02/12/2020   Procedure: CARDIOVERSION;  Surgeon: [name], MD;  Location: [facility] ENDOSCOPY;  Service:  Cardiovascular;  Laterality: N/A;   COLONOSCOPY N/A 11/08/2017   Procedure: COLONOSCOPY;  Surgeon: Milus Banister, MD;  Location: [facility] ENDOSCOPY;  Service: Endoscopy;  Laterality: N/A;   COLONOSCOPY WITH PROPOFOL N/A 05/31/2020   Procedure: COLONOSCOPY WITH PROPOFOL;  Surgeon: Irene Shipper, MD;  Location: [facility] ENDOSCOPY;  Service: Endoscopy;  Laterality: N/A;   DILATION AND CURETTAGE OF UTERUS     ENTEROSCOPY N/A 03/02/2020   Procedure: ENTEROSCOPY;  Surgeon: Yetta Flock, MD;  Location: [facility] ENDOSCOPY;  Service: Gastroenterology;  Laterality: N/A;   EP IMPLANTABLE DEVICE N/A 10/06/2015   Procedure: BIV ICD Generator Changeout;  Surgeon: [name], MD;  Location: [place] CV [facility];  Service: Cardiovascular;  Laterality: N/A;   ESOPHAGOGASTRODUODENOSCOPY N/A 10/26/2017   Procedure: ESOPHAGOGASTRODUODENOSCOPY (EGD);  Surgeon: Ladene Artist, MD;  Location: [facility];  Service: Endoscopy;  Laterality: N/A;   ESOPHAGOGASTRODUODENOSCOPY (EGD) WITH PROPOFOL N/A 11/07/2017   Procedure: ESOPHAGOGASTRODUODENOSCOPY (EGD) WITH PROPOFOL;  Surgeon: Milus Banister, MD;  Location: [facility]OS[facility];  Service: Endoscopy;  Laterality: N/A;   ESOPHAGOGASTRODUODENOSCOPY (EGD) WITH PROPOFOL N/A 05/29/2020   Procedure: ESOPHAGOGASTRODUODENOSCOPY (EGD) WITH PROPOFOL;  Surgeon: Doran Stabler, MD;  Location: [place];  Service: Gastroenterology;  Laterality: N/A;   GIVENS CAPSULE STUDY N/A 05/19/2020   Procedure: GIVENS CAPSULE STUDY;  Surgeon: [name], MD;  Location: [place];  Service: Endoscopy;  Laterality: N/A;  .adm for obs since pacemaker, PA wil enter order and see pt   GIVENS CAPSULE STUDY N/A 05/29/2020   Procedure: GIVENS CAPSULE STUDY;  Surgeon: Doran Stabler, MD;  Location: [place];  Service: Gastroenterology;  Laterality: N/A;   HEMATOMA EVACUATION Left 09/24/2020   Procedure: EVACUATION HEMATOMA ARM;  Surgeon: Rosetta Posner, MD;  Location: [facility];  Service: Vascular;   Laterality: Left;   HOT HEMOSTASIS N/A 10/26/2017   Procedure: HOT HEMOSTASIS (ARGON PLASMA COAGULATION/BICAP);  Surgeon: Ladene Artist, MD;  Location: [facility];  Service: Endoscopy;  Laterality: N/A;   HOT HEMOSTASIS N/A 03/02/2020   Procedure: HOT HEMOSTASIS (ARGON PLASMA COAGULATION/BICAP);  Surgeon: Yetta Flock, MD;  Location: [facility] ENDOSCOPY;  Service: Gastroenterology;  Laterality: N/A;   HOT HEMOSTASIS N/A 05/31/2020   Procedure: HOT HEMOSTASIS (ARGON PLASMA COAGULATION/BICAP);  Surgeon: Irene Shipper, MD;  Location: [facility] ENDOSCOPY;  Service: Endoscopy;  Laterality: N/A;   INSERT / REPLACE / REMOVE PACEMAKER  ?2008   IR PERC TUN PERIT CATH WO PORT S&I /IMAG  07/05/2020   POLYPECTOMY  11/08/2017   Procedure: POLYPECTOMY;  Surgeon: Milus Banister, MD;  Location: [facility] ENDOSCOPY;  Service: Endoscopy;;   PROCTOSCOPY N/A 05/22/2018   Procedure: RIGID PROCTOSCOPY;  Surgeon: Michael Boston, MD;  Location: [facility]S;  Service: General;  Laterality: N/A;   RIGHT HEART CATH N/A 02/13/2018   Procedure: RIGHT HEART CATH;  Surgeon: [name], MD;  Location: [place] [facility];  Service: Cardiovascular;  Laterality: N/A;   RIGHT HEART CATH N/A 06/23/2020   Procedure: RIGHT HEART CATH;  Surgeon: [name], MD;  Location: [place] [facility];  Service: Cardiovascular;  Laterality: N/A;   RIGHT/LEFT HEART CATH AND CORONARY ANGIOGRAPHY N/A 12/11/2019   Procedure: RIGHT/LEFT HEART CATH AND CORONARY ANGIOGRAPHY;  Surgeon: [name], MD;  Location: [place] [facility];  Service: Cardiovascular;  Laterality: N/A;   SUBMUCOSAL TATTOO INJECTION  03/02/2020   Procedure: SUBMUCOSAL TATTOO INJECTION;  Surgeon: Yetta Flock, MD;  Location: [facility] ENDOSCOPY;  Service: Gastroenterology;;   SUBMUCOSAL TATTOO INJECTION  05/31/2020   Procedure: SUBMUCOSAL TATTOO INJECTION;  Surgeon: Irene Shipper, MD;  Location: [facility] ENDOSCOPY;  Service: Endoscopy;;   TUBAL LIGATION     XI ROBOTIC ASSISTED LOWER  ANTERIOR RESECTION N/A 05/22/2018   Procedure: XI ROBOTIC ASSISTED SIGMOID COLOECTOMY MOBILIZATION OF SPLENIC FLEXURE, FIREFLY ASSESSMENT OF PERFUSION;  Surgeon: Michael Boston, MD;  Location: [facility];  Service: General;  Laterality: N/A;  ERAS PATHWAY    Family History  Problem Relation Age of Onset   Asthma Father    Heart attack Father    Asthma Sister    Lung cancer Sister    Heart attack  Mother    Stroke Brother    Colon cancer Neg Hx    Esophageal cancer Neg Hx    Pancreatic cancer Neg Hx    Stomach cancer Neg Hx    Liver disease Neg Hx     No Known Allergies  Current Outpatient Medications on File Prior to Visit  Medication Sig Dispense Refill   amiodarone (PACERONE) 200 MG tablet Take 1 tablet (200 mg total) by mouth daily. 90 tablet 3   AURYXIA 1 GM 210 MG(Fe) tablet Take 420 mg by mouth 3 (three) times daily with meals.     carvedilol (COREG) 3.125 MG tablet Take 1 tablet (3.125 mg total) by mouth 2 (two) times daily with a meal. 60 tablet 5   cromolyn (OPTICROM) 4 % ophthalmic solution Place 1 drop into both eyes 2 (two) times daily.     diclofenac Sodium (VOLTAREN) 1 % GEL Apply 1 application topically daily.     gabapentin (NEURONTIN) 300 MG capsule Take 1 capsule (300 mg total) by mouth daily. 180 capsule 2   HYDROcodone-acetaminophen (NORCO/VICODIN) 5-325 MG tablet Take 1 tablet by mouth 3 (three) times daily as needed for pain.     isosorbide-hydrALAZINE (BIDIL) 20-37.5 MG tablet Take 0.5 tablets by mouth 3 (three) times daily. 126 tablet 5   levothyroxine (SYNTHROID) 137 MCG tablet Take 1 tablet (137 mcg total) by mouth daily before breakfast. 90 tablet 3   No current facility-administered medications on file prior to visit.    BP 133/60   Pulse 66   Resp 18   Ht 4\' 11"  (1.499 m)   Wt 132 lb (59.9 kg)   SpO2 97%   BMI 26.66 kg/m        Objective:   Physical Exam  General Mental Status- Alert. General Appearance- Not in acute distress.    Skin General: Color- Normal Color. Moisture- Normal Moisture.  Neck Carotid Arteries- Normal color. Moisture- Normal Moisture. No carotid bruits. No JVD.  Chest and Lung Exam Auscultation: Breath Sounds:-Normal.  Cardiovascular Auscultation:Rythm- Regular. Murmurs & Other Heart Sounds:Auscultation of the heart reveals- No Murmurs.  Abdomen Inspection:-Inspeection Normal. Palpation/Percussion:Note:No mass. Palpation and Percussion of the abdomen reveal- Non Tender, Non Distended + BS, no rebound or guarding.   Neurologic Cranial Nerve exam:- CN III-XII intact(No nystagmus), symmetric smile. Strength:- 5/5 equal and symmetric strength both upper and lower extremities.   Lower ext- no pedal edam. Symmetric calves.     Assessment & Plan:   Hx of atrial fibrillation. Controlled with amiodorone and coreg. Rate controlled today. Stable clinically. Put in referral back to cardiologist as on review appears that was intention on finding referral that had been placed. Placed new referral.   Htn- bp well controlled today.   Hx of diabetes but well controlled on a1c review with diet alone.  For ESRD continue with dialysis.  For arm pain continue current regimen. Glad to hear pain is improving  For insomnia refilled trazadone.  For elevated tsh repeat tsh and t4 today.  Follow up in 4-6 weeks or sooner if needed.  Mackie Pai, PA-C

## 2020-12-16 NOTE — Addendum Note (Signed)
Addended by: Anabel Halon on: 12/16/2020 10:52 AM   Modules accepted: Orders

## 2020-12-20 ENCOUNTER — Other Ambulatory Visit: Payer: Self-pay | Admitting: Medical

## 2020-12-20 DIAGNOSIS — Z1231 Encounter for screening mammogram for malignant neoplasm of breast: Secondary | ICD-10-CM

## 2020-12-23 DIAGNOSIS — I8500 Esophageal varices without bleeding: Secondary | ICD-10-CM | POA: Insufficient documentation

## 2020-12-23 DIAGNOSIS — I8510 Secondary esophageal varices without bleeding: Secondary | ICD-10-CM | POA: Insufficient documentation

## 2020-12-23 HISTORY — DX: Secondary esophageal varices without bleeding: I85.10

## 2021-01-18 ENCOUNTER — Ambulatory Visit (INDEPENDENT_AMBULATORY_CARE_PROVIDER_SITE_OTHER): Payer: Medicare Other | Admitting: Medical

## 2021-01-18 ENCOUNTER — Telehealth: Payer: Self-pay

## 2021-01-18 ENCOUNTER — Encounter: Payer: Self-pay | Admitting: Medical

## 2021-01-18 ENCOUNTER — Other Ambulatory Visit: Payer: Self-pay

## 2021-01-18 VITALS — BP 137/70 | HR 61 | Resp 18 | Ht 59.0 in | Wt 130.8 lb

## 2021-01-18 DIAGNOSIS — E038 Other specified hypothyroidism: Secondary | ICD-10-CM | POA: Diagnosis not present

## 2021-01-18 DIAGNOSIS — I482 Chronic atrial fibrillation, unspecified: Secondary | ICD-10-CM

## 2021-01-18 DIAGNOSIS — N186 End stage renal disease: Secondary | ICD-10-CM

## 2021-01-18 DIAGNOSIS — I152 Hypertension secondary to endocrine disorders: Secondary | ICD-10-CM

## 2021-01-18 DIAGNOSIS — Z8719 Personal history of other diseases of the digestive system: Secondary | ICD-10-CM

## 2021-01-18 DIAGNOSIS — R1032 Left lower quadrant pain: Secondary | ICD-10-CM | POA: Diagnosis not present

## 2021-01-18 DIAGNOSIS — B1920 Unspecified viral hepatitis C without hepatic coma: Secondary | ICD-10-CM

## 2021-01-18 DIAGNOSIS — E1159 Type 2 diabetes mellitus with other circulatory complications: Secondary | ICD-10-CM

## 2021-01-18 DIAGNOSIS — I5032 Chronic diastolic (congestive) heart failure: Secondary | ICD-10-CM

## 2021-01-18 DIAGNOSIS — K7469 Other cirrhosis of liver: Secondary | ICD-10-CM

## 2021-01-18 LAB — COMPREHENSIVE METABOLIC PANEL
ALT: 9 U/L (ref 0–35)
AST: 13 U/L (ref 0–37)
Albumin: 4.6 g/dL (ref 3.5–5.2)
Alkaline Phosphatase: 75 U/L (ref 39–117)
BUN: 21 mg/dL (ref 6–23)
CO2: 32 mEq/L (ref 19–32)
Calcium: 9.8 mg/dL (ref 8.4–10.5)
Chloride: 92 mEq/L — ABNORMAL LOW (ref 96–112)
Creatinine, Ser: 3.59 mg/dL — ABNORMAL HIGH (ref 0.40–1.20)
GFR: 12.1 mL/min — CL (ref >60.00)
Glucose, Bld: 63 mg/dL — ABNORMAL LOW (ref 70–99)
Potassium: 4.3 mEq/L (ref 3.5–5.1)
Sodium: 133 mEq/L — ABNORMAL LOW (ref 135–145)
Total Bilirubin: 0.6 mg/dL (ref 0.2–1.2)
Total Protein: 8.1 g/dL (ref 6.0–8.3)

## 2021-01-18 LAB — CBC WITH DIFFERENTIAL/PLATELET
Basophils Absolute: 0 10*3/uL (ref 0.0–0.1)
Basophils Relative: 1 % (ref 0.0–3.0)
Eosinophils Absolute: 0.2 10*3/uL (ref 0.0–0.7)
Eosinophils Relative: 4.7 % (ref 0.0–5.0)
HCT: 35.2 % — ABNORMAL LOW (ref 36.0–46.0)
Hemoglobin: 11.5 g/dL — ABNORMAL LOW (ref 12.0–15.0)
Lymphocytes Relative: 22.6 % (ref 12.0–46.0)
Lymphs Abs: 1 10*3/uL (ref 0.7–4.0)
MCHC: 32.7 g/dL (ref 30.0–36.0)
MCV: 93 fl (ref 78.0–100.0)
Monocytes Absolute: 0.4 10*3/uL (ref 0.1–1.0)
Monocytes Relative: 10.3 % (ref 3.0–12.0)
Neutro Abs: 2.7 10*3/uL (ref 1.4–7.7)
Neutrophils Relative %: 61.4 % (ref 43.0–77.0)
Platelets: 152 10*3/uL (ref 150.0–400.0)
RBC: 3.79 Mil/uL — ABNORMAL LOW (ref 3.87–5.11)
RDW: 16 % — ABNORMAL HIGH (ref 11.5–15.5)
WBC: 4.3 10*3/uL (ref 4.0–10.5)

## 2021-01-18 MED ORDER — AMOXICILLIN-POT CLAVULANATE 875-125 MG PO TABS: 1.0000 | ORAL_TABLET | Freq: Two times a day (BID) | ORAL | 0 refills | Status: DC

## 2021-01-18 NOTE — Patient Instructions (Addendum)
Hx of atrial fibrillation. Controlled with amiodarone and coreg. On auscultation heart sounds not to be in afib. Also normal rate.  Hx of chf. Stable no signs or flare.   Hx of diabetes. Last a1c showed good control with diet alone.   Continue to follow up with Gi for cirrhosis. No decompensation per last note.  For hypothyroid stay on current dose of levothyroxine.   Scheduled to get flu vaccine tomorrow.  Consider getting covid booster this month(based on risk score and age). You could ask our pharmacy when then new covid vaccine will be available.   For recent left lower quadrant pain over weekend but now resolved will get cbc, cmp and 1 view abd xray. Diverticulitis is possible since known diverticula in sigmoid colon. Get studies done and if pain in left lower quadrant returns notify us immediatley.  If severe abd pain signs/symptoms after hours then ED evaluation.  Follow up 6 months or sooner if needed  Decrease gfr came back and elevated cr. Similar to prior levels. Known ckd. Asking staff to fax to her nephrologist.

## 2021-01-18 NOTE — Progress Notes (Signed)
Subjective:    Patient ID: Morgan Clay, female    DOB: [birth year], 75 y.o.   MRN: [id number]  HPI  Pt in for follow up.   Last visit A/P  "Hx of atrial fibrillation. Controlled with amiodorone and coreg. Rate controlled today. Stable clinically. Put in referral back to cardiologist as on review appears that was intention on finding referral that had been placed. Placed new referral.     Htn- bp well controlled today.    Hx of diabetes but well controlled on a1c review with diet alone.   For ESRD continue with dialysis.   For arm pain continue current regimen. Glad to hear pain is improving   For insomnia refilled trazadone.   For elevated tsh repeat tsh and t4 today."   Pt tsh was mild high and t4 was normal on lab review. Had advised staying on same dose of thyroid med.  BP initially mild high systolic today. No cardiac or neurologic signs or symptoms.   Pt has hx of cirrhosis and since last visit did see GI MD with [facility]. Gi assessment and plan below in ".  Digestive  Other cirrhosis of liver (CMS/HCC) - Primary  Patient with history of cirrhosis secondary to hepatitis C with no history of hepatic decompensation.  She has had some hyponatremia and hypoalbuminemia in the past however most recent labs in May 2022 show relatively well-preserved liver synthetic function. She will have labs to update liver synthetic function.  Hepatoma screening -I reviewed with the patient that she needs screening for hepatoma every 6 months. She will next be due for hepatoma screening in October 2022 and we will arrange a right upper quadrant ultrasound. We will also plan to check AFP with upcoming labs.  Ascites-patient did have trace ascites noted on CT in February 2020. She has never required paracentesis most recent imaging had no evidence of ascites. I reviewed the need for low-sodium diet.  Hepatic encephalopathy - no evidence of encephalopathy and reviewed signs/symptoms of  encephalopathy and indication to contact our office."   Last covid booster in December.   Pt notes this weekend since Friday had left lower quadrant pain on and off. No nausea. No vomiting. No fever or chills. 1-22 colonoscopy and divertucla in sigmoid colon Monday am pain went away completely. No uti signs or symptoms. No cva pain. Pt has no diarrhea. Does report tend to be constipated. Normal bm last night.   Review of Systems  Constitutional:  Negative for chills, fatigue and fever.  HENT:  Negative for congestion and ear pain.   Respiratory:  Negative for chest tightness, shortness of breath and wheezing.   Cardiovascular:  Negative for chest pain and palpitations.  Gastrointestinal:  Negative for abdominal pain, diarrhea, nausea and vomiting.       See hpi.  Genitourinary:  Negative for dysuria and hematuria.  Musculoskeletal:  Negative for arthralgias and back pain.  Neurological:  Negative for dizziness, syncope, weakness, numbness and headaches.  Hematological:  Negative for adenopathy. Does not bruise/bleed easily.  Psychiatric/Behavioral:  Negative for behavioral problems and confusion. The patient is not nervous/anxious.    Past Medical History:  Diagnosis Date   A-fib [facility])    on Eliquis   AICD (automatic cardioverter/defibrillator) present 10/06/2015   Anemia    Angiodysplasia of small intestine ([place]) 06/10/2020   Ileum - seen on capsule endoscopy 05/2020 - ablated at colonoscopy   ARF (acute renal failure) ([place])    Arthritis    "qwhere" (  01/03/2018)   Asthma    reports mild asthma since childhood - had COPD on dx list from prior PCP   Chronic bronchitis ([place])    "get it most years; not this past year though" (01/03/2018)   Chronic kidney disease    "? stage;  followed by [facility]; [name]" (01/03/2018)   Chronic systolic congestive heart failure ([place])    Cirrhosis ([place]) 09/14/2020   dx by Dr Silvano Rusk    COVID-19 05/03/2020   DEPRESSION 12/17/2009    Annotation: PHQ-9 score = 14 done on 12/17/2009 Qualifier: Diagnosis of  By: Hassell Done FNP, [place]     Diabetes mellitus without complication ([place])    DIET CONTROLLED    Diverticulosis    Enteritis    FIBROIDS, UTERUS 03/05/2008   GI bleeding 01/03/2018   Glaucoma    Gout    Hepatitis C    HEPATITIS C - s/p treatment with Harvoni, saw hepatology, Dawn Drazek   History of blood transfusion ~ 11/2017   Hyperlipemia 12/06/2012   HYPERTENSION, BENIGN 04/24/2007   Hypothyroidism    LBBB (left bundle branch block)    Lower GI bleeding    "been dealing w/it since 07/2017" (01/03/2018)   Nonischemic cardiomyopathy ([place])    OBESITY 05/27/2009   OBSTRUCTIVE SLEEP APNEA 11/14/2007   no CPAP   OSTEOPENIA 09/30/2008   Pain and swelling of left upper extremity 08/08/2017   Personal history of other infectious and parasitic disease    Hepatitis B   Pneumonia    "several times" (01/03/2018)   Pulmonary edema, acute ([place]) 08/09/2017     Social History   Socioeconomic History   Marital status: Divorced    Spouse name: Not on file   Number of children: 1   Years of education: 12   Highest education level: Not on file  Occupational History   Not on file  Tobacco Use   Smoking status: Never   Smokeless tobacco: Never  Vaping Use   Vaping Use: Never used  Substance and Sexual Activity   Alcohol use: Not Currently    Comment: 01/03/2018 "couple drinks/month"   Drug use: Not Currently    Types: Marijuana    Comment: VERY INTERMITTENT    Sexual activity: Not Currently  Other Topics Concern   Not on file  Social History Narrative   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      Home Situation: lives alone      Spiritual Beliefs: Christian - Baptist      Lifestyle: no regular exercise; poor diet      She is divorced at least one adult child   Never smoker    rare alcohol to occasional at best    no substance abuse         Social Determinants of Radio broadcast assistant Strain: Low  Risk    Difficulty of Paying Living Expenses: Not hard at all  Food Insecurity: No Food Insecurity   Worried About Charity fundraiser in the Last Year: Never true   Ran Out of Food in the Last Year: Never true  Transportation Needs: No Transportation Needs   Lack of Transportation (Medical): No   Lack of Transportation (Non-Medical): No  Physical Activity: Not on file  Stress: Not on file  Social Connections: Not on file  Intimate Partner Violence: Not on file    Past Surgical History:  Procedure Laterality Date   APPENDECTOMY     AV FISTULA PLACEMENT  Left 08/05/2020   Procedure: LEFT UPPER EXTREMITY ARTERIOVENOUS (AV) FISTULA CREATION;  Surgeon: Cherre Robins, MD;  Location: [place];  Service: Vascular;  Laterality: Left;   [place] Left 09/16/2020   Procedure: LEFT SECOND STAGE [place];  Surgeon: Cherre Robins, MD;  Location: [place];  Service: Vascular;  Laterality: Left;  PERIPHERAL NERVE BLOCK   CARDIAC CATHETERIZATION     CARDIOVERSION N/A 12/14/2014   Procedure: CARDIOVERSION;  Surgeon: Dorothy Spark, MD;  Location: [place];  Service: Cardiovascular;  Laterality: N/A;   CARDIOVERSION N/A 02/26/2017   Procedure: CARDIOVERSION;  Surgeon: Evans Lance, MD;  Location: [place] CV LAB;  Service: Cardiovascular;  Laterality: N/A;   CARDIOVERSION N/A 07/24/2017   Procedure: CARDIOVERSION;  Surgeon: Acie Fredrickson Wonda Cheng, MD;  Location: [facility] ENDOSCOPY;  Service: Cardiovascular;  Laterality: N/A;   CARDIOVERSION N/A 02/12/2020   Procedure: CARDIOVERSION;  Surgeon: [name], MD;  Location[facility]COPY;  Service: Cardiovascular;  Laterality: N/A;   COLONOSCOPY N/A 11/08/2017   Procedure: COLONOSCOPY;  Surgeon: Milus Banister, MD;  Location: [facility] ENDOSCOPY;  Service: Endoscopy;  Laterality: N/A;   COLONOSCOPY WITH PROPOFOL N/A 05/31/2020   Procedure: COLONOSCOPY WITH PROPOFOL;  Surgeon: Irene Shipper, MD;  Location: [facility] ENDOSCOPY;  Service:  Endoscopy;  Laterality: N/A;   DILATION AND CURETTAGE OF UTERUS     ENTEROSCOPY N/A 03/02/2020   Procedure: ENTEROSCOPY;  Surgeon: Yetta Flock, MD;  Location: [facility] ENDOSCOPY;  Service: Gastroenterology;  Laterality: N/A;   EP IMPLANTABLE DEVICE N/A 10/06/2015   Procedure: BIV ICD Generator Changeout;  Surgeon: [name], MD;  Location: [place] CV LAB;  Service: Cardiovascular;  Laterality: N/A;   ESOPHAGOGASTRODUODENOSCOPY N/A 10/26/2017   Procedure: ESOPHAGOGASTRODUODENOSCOPY (EGD);  Surgeon: Ladene Artist, MD;  Location[facility];  Service: Endoscopy;  Laterality: N/A;   ESOPHAGOGASTRODUODENOSCOPY (EGD) WITH PROPOFOL N/A 11/07/2017   Procedure: ESOPHAGOGASTRODUODENOSCOPY (EGD) WITH PROPOFOL;  Surgeon: Milus Banister, MD;  Location: [facility] ENDOSCOPY;  Service: Endoscopy;  Laterality: N/A;   ESOPHAGOGASTRODUODENOSCOPY (EGD) WITH PROPOFOL N/A 05/29/2020   Procedure: ESOPHAGOGASTRODUODENOSCOPY (EGD) WITH PROPOFOL;  Surgeon: Doran Stabler, MD;  Location: [place];  Service: Gastroenterology;  Laterality: N/A;   GIVENS CAPSULE STUDY N/A 05/19/2020   Procedure: GIVENS CAPSULE STUDY;  Surgeon: [name], MD;  Location: [place];  Service: Endoscopy;  Laterality: N/A;  .adm for obs since pacemaker, PA wil enter order and see pt   GIVENS CAPSULE STUDY N/A 05/29/2020   Procedure: GIVENS CAPSULE STUDY;  Surgeon: Doran Stabler, MD;  Location: [place];  Service: Gastroenterology;  Laterality: N/A;   HEMATOMA EVACUATION Left 09/24/2020   Procedure: EVACUATION HEMATOMA ARM;  Surgeon: Rosetta Posner, MD;  Location: [facility] OR;  Service: Vascular;  Laterality: Left;   HOT HEMOSTASIS N/A 10/26/2017   Procedure: HOT HEMOSTASIS (ARGON PLASMA COAGULATION/BICAP);  Surgeon: Ladene Artist, MD;  Location: [facility];  Service: Endoscopy;  Laterality: N/A;   HOT HEMOSTASIS N/A 03/02/2020   Procedure: HOT HEMOSTASIS (ARGON PLASMA COAGULATION/BICAP);  Surgeon: Yetta Flock,  MD;  Location: [facility] ENDOSCOPY;  Service: Gastroenterology;  Laterality: N/A;   HOT HEMOSTASIS N/A 05/31/2020   Procedure: HOT HEMOSTASIS (ARGON PLASMA COAGULATION/BICAP);  Surgeon: Irene Shipper, MD;  Location: [facility] ENDOSCOPY;  Service: Endoscopy;  Laterality: N/A;   INSERT / REPLACE / REMOVE PACEMAKER  ?2008   IR PERC TUN PERIT CATH WO PORT S&I /IMAG  07/05/2020   POLYPECTOMY  11/08/2017   Procedure: POLYPECTOMY;  Surgeon: Milus Banister, MD;  Location: [facility];  Service: Endoscopy;;   PROCTOSCOPY N/A 05/22/2018   Procedure: RIGID PROCTOSCOPY;  Surgeon: Michael Boston, MD;  Location: [facility] ORS;  Service: General;  Laterality: N/A;   RIGHT HEART CATH N/A 02/13/2018   Procedure: RIGHT HEART CATH;  Surgeon: [name], MD;  Location: [place] CV LAB;  Service: Cardiovascular;  Laterality: N/A;   RIGHT HEART CATH N/A 06/23/2020   Procedure: RIGHT HEART CATH;  Surgeon: [name], MD;  Location: [employer] CV LAB;  Service: Cardiovascular;  Laterality: N/A;   RIGHT/LEFT HEART CATH AND CORONARY ANGIOGRAPHY N/A 12/11/2019   Procedure: RIGHT/LEFT HEART CATH AND CORONARY ANGIOGRAPHY;  Surgeon: [name], MD;  Location: [place] CV LAB;  Service: Cardiovascular;  Laterality: N/A;   SUBMUCOSAL TATTOO INJECTION  03/02/2020   Procedure: SUBMUCOSAL TATTOO INJECTION;  Surgeon: Yetta Flock, MD;  Location: [facility] ENDOSCOPY;  Service: Gastroenterology;;   SUBMUCOSAL TATTOO INJECTION  05/31/2020   Procedure: SUBMUCOSAL TATTOO INJECTION;  Surgeon: Irene Shipper, MD;  Location: [facility] ENDOSCOPY;  Service: Endoscopy;;   TUBAL LIGATION     XI ROBOTIC ASSISTED LOWER ANTERIOR RESECTION N/A 05/22/2018   Procedure: XI ROBOTIC ASSISTED SIGMOID COLOECTOMY MOBILIZATION OF SPLENIC FLEXURE, FIREFLY ASSESSMENT OF PERFUSION;  Surgeon: Michael Boston, MD;  Location: [facility];  Service: General;  Laterality: N/A;  ERAS PATHWAY    Family History  Problem Relation Age of Onset   Asthma Father    Heart attack  Father    Asthma Sister    Lung cancer Sister    Heart attack Mother    Stroke Brother    Colon cancer Neg Hx    Esophageal cancer Neg Hx    Pancreatic cancer Neg Hx    Stomach cancer Neg Hx    Liver disease Neg Hx     No Known Allergies  Current Outpatient Medications on File Prior to Visit  Medication Sig Dispense Refill   amiodarone (PACERONE) 200 MG tablet Take 1 tablet (200 mg total) by mouth daily. 90 tablet 3   AURYXIA 1 GM 210 MG(Fe) tablet Take 420 mg by mouth 3 (three) times daily with meals.     carvedilol (COREG) 3.125 MG tablet Take 1 tablet (3.125 mg total) by mouth 2 (two) times daily with a meal. 60 tablet 5   cromolyn (OPTICROM) 4 % ophthalmic solution Place 1 drop into both eyes 2 (two) times daily.     diclofenac Sodium (VOLTAREN) 1 % GEL Apply 1 application topically daily.     gabapentin (NEURONTIN) 300 MG capsule Take 1 capsule (300 mg total) by mouth daily. 180 capsule 2   HYDROcodone-acetaminophen (NORCO/VICODIN) 5-325 MG tablet Take 1 tablet by mouth 3 (three) times daily as needed for pain.     isosorbide-hydrALAZINE (BIDIL) 20-37.5 MG tablet Take 0.5 tablets by mouth 3 (three) times daily. 126 tablet 5   levothyroxine (SYNTHROID) 137 MCG tablet Take 1 tablet (137 mcg total) by mouth daily before breakfast. 90 tablet 3   traZODone (DESYREL) 50 MG tablet Take 0.5-1 tablets (25-50 mg total) by mouth at bedtime as needed for sleep. 30 tablet 3   No current facility-administered medications on file prior to visit.    BP (!) 144/50   Pulse 61   Resp 18   Ht 4\' 11"  (1.499 m)   Wt 130 lb 12.8 oz (59.3 kg)   SpO2 98%   BMI 26.42 kg/m  Objective:   Physical Exam  General- No acute distress. Pleasant patient. Neck- Full range of motion, no jvd Lungs- Clear, even and unlabored. Heart- regular rate and rhythm. Neurologic- CNII- XII grossly intact.   Lower ext- no pedal edema.   Abdomen- soft, nt, nd, +bs, no rebound or guarding and no  organomegaly. No cva tenderness.    Assessment & Plan:   Patient Instructions  Hx of atrial fibrillation. Controlled with amiodarone and coreg. On auscultation heart sounds not to be in afib. Also normal rate.  Hx of chf. Stable no signs or flare.   Hx of diabetes. Last a1c showed good control with diet alone.   Continue to follow up with Gi for cirrhosis. No decompensation per last note.  For hypothyroid stay on current dose of levothyroxine.   Scheduled to get flu vaccine tomorrow.  Consider getting covid booster this month(based on risk score and age). You could ask our pharmacy when then new covid vaccine will be available.   Follow up 6 months or sooner if needed    Mackie Pai, PA-C   Time spent with patient today was  42 minutes which consisted of chart rediew, discussing diagnosis, work up treatment and documentation.

## 2021-01-18 NOTE — Telephone Encounter (Signed)
CRITICAL VALUE STICKER  CRITICAL VALUE: GFR 12.10  RECEIVER (on-site recipient of call): Towanna Avery, RMA  DATE & TIME NOTIFIED: 01/18/2021 @ 3:22 pm  MESSENGER (representative from lab): Deborra Medina  MD NOTIFIED: Mackie Pai, PA-C  TIME OF NOTIFICATION: 3:27  RESPONSE:

## 2021-01-24 NOTE — Progress Notes (Signed)
[facility] [place]  ASSESSMENT / PLAN: 75 y.o. female with ESRD status post left second stage basilic vein transposition 09/16/2020.  She unfortunately developed a hematoma in the surgical bed 09/24/2020 which required evacuation.  No bleeding source was found.  She had persistent discomfort in her left medial arm, likely from irritation of the medial antebrachial cutaneous nerve from the hematoma, which has now nearly resolved.   Ok to start using LUE AVF for HD. Once the fistula is working well, please remove tunneled dialysis catheter. Follow up with me as needed.   CHIEF COMPLAINT: Status post hematoma evacuation  SUBJECTIVE: 75 year old woman well-known to me for whom I created a left second stage basilic vein transposition 09/16/2020.  She unfortunately developed a hematoma which required evacuation 09/24/2020.  She reports continued pain in her medial arm after the surgery.  She is dialyzing without difficulty through her right IJ tunneled dialysis catheter.  She has no difficulty with her left hand.  11/02/20: continues to have pain in the left medial forearm. She has established care with pain management. No symptoms of steal syndrome in the left hand.  01/25/21: Returns to clinic for follow-up.  She is doing much better.  She reports the pain in her left arm is nearly resolved.  She has no symptoms in the left hand typical of steal.  She does report occasional numbness in the hands bilaterally.  The discomfort in her hands does not seem to be associated with neck pain or positional changes in the upper extremities.  OBJECTIVE: BP 126/66 (BP Location: Right Arm, Patient Position: Sitting, Cuff Size: Normal)   Pulse 64   Temp 98 F (36.7 C)   Resp 20   Ht 4\' 11"  (1.499 m)   Wt 129 lb (58.5 kg)   SpO2 100%   BMI 26.05 kg/m   Right IJ TDC in place Left upper extremity basilic vein transposition with thrill Left upper extremity incisions healed Palpable radial  pulse on the left Normal motor and sensory function of the left hand  CBC Latest Ref Rng & Units 01/18/2021 11/17/2020 10/19/2020  WBC 4.0 - 10.5 K/uL 4.3 4.7 6.3  Hemoglobin 12.0 - 15.0 g/dL 11.5(L) 11.9(L) 11.4(L)  Hematocrit 36.0 - 46.0 % 35.2(L) 37.4 34.7(L)  Platelets 150.0 - 400.0 K/uL 152.0 126(L) 201.0     CMP Latest Ref Rng & Units 01/18/2021 11/17/2020 09/25/2020  Glucose 70 - 99 mg/dL 63(L) 99 89  BUN 6 - 23 mg/dL 21 11 35(H)  Creatinine 0.40 - 1.20 mg/dL 3.59(H) 2.21(H) 5.87(H)  Sodium 135 - 145 mEq/L 133(L) 135 131(L)  Potassium 3.5 - 5.1 mEq/L 4.3 3.6 3.7  Chloride 96 - 112 mEq/L 92(L) 94(L) 93(L)  CO2 19 - 32 mEq/L 32 32 25  Calcium 8.4 - 10.5 mg/dL 9.8 9.8 8.9  Total Protein 6.0 - 8.3 g/dL 8.1 - -  Total Bilirubin 0.2 - 1.2 mg/dL 0.6 - -  Alkaline Phos 39 - 117 U/L 75 - -  AST 0 - 37 U/L 13 - -  ALT 0 - 35 U/L 9 - -    Estimated Creatinine Clearance: 10.9 mL/min (A) (by C-G formula based on SCr of 3.59 mg/dL (H)).  [name]. Stanford Breed, MD [facility] Phone Number: [phone] 01/25/2021 12:15 PM

## 2021-01-25 ENCOUNTER — Ambulatory Visit (INDEPENDENT_AMBULATORY_CARE_PROVIDER_SITE_OTHER): Payer: Medicare Other | Admitting: Vascular Surgery

## 2021-01-25 ENCOUNTER — Encounter: Payer: Self-pay | Admitting: Vascular Surgery

## 2021-01-25 ENCOUNTER — Other Ambulatory Visit: Payer: Self-pay

## 2021-01-25 VITALS — BP 126/66 | HR 64 | Temp 98.0°F | Resp 20 | Ht 59.0 in | Wt 129.0 lb

## 2021-01-25 DIAGNOSIS — N186 End stage renal disease: Secondary | ICD-10-CM | POA: Diagnosis not present

## 2021-01-25 DIAGNOSIS — Z992 Dependence on renal dialysis: Secondary | ICD-10-CM

## 2021-02-01 ENCOUNTER — Ambulatory Visit (INDEPENDENT_AMBULATORY_CARE_PROVIDER_SITE_OTHER): Payer: Medicare Other

## 2021-02-01 DIAGNOSIS — I428 Other cardiomyopathies: Secondary | ICD-10-CM | POA: Diagnosis not present

## 2021-02-02 LAB — CUP PACEART REMOTE DEVICE CHECK
Battery Remaining Longevity: 17 mo
Battery Voltage: 2.9 V
Brady Statistic AP VP Percent: 7.04 %
Brady Statistic AP VS Percent: 0 %
Brady Statistic AS VP Percent: 92.91 %
Brady Statistic AS VS Percent: 0.04 %
Brady Statistic RA Percent Paced: 6.92 %
Brady Statistic RV Percent Paced: 99.84 %
Date Time Interrogation Session: 20220927002206
HighPow Impedance: 43 Ohm
HighPow Impedance: 72 Ohm
Implantable Lead Implant Date: 20080818
Implantable Lead Implant Date: 20080818
Implantable Lead Implant Date: 20080818
Implantable Lead Location: 753858
Implantable Lead Location: 753859
Implantable Lead Location: 753860
Implantable Lead Model: 4193
Implantable Lead Model: 5076
Implantable Lead Model: 6947
Implantable Pulse Generator Implant Date: 20170531
Lead Channel Impedance Value: 247 Ohm
Lead Channel Impedance Value: 342 Ohm
Lead Channel Impedance Value: 380 Ohm
Lead Channel Impedance Value: 399 Ohm
Lead Channel Impedance Value: 4047 Ohm
Lead Channel Impedance Value: 4047 Ohm
Lead Channel Pacing Threshold Amplitude: 0.875 V
Lead Channel Pacing Threshold Amplitude: 1.125 V
Lead Channel Pacing Threshold Amplitude: 1.5 V
Lead Channel Pacing Threshold Pulse Width: 0.4 ms
Lead Channel Pacing Threshold Pulse Width: 0.4 ms
Lead Channel Pacing Threshold Pulse Width: 0.4 ms
Lead Channel Sensing Intrinsic Amplitude: 12.125 mV
Lead Channel Sensing Intrinsic Amplitude: 12.125 mV
Lead Channel Sensing Intrinsic Amplitude: 3.75 mV
Lead Channel Sensing Intrinsic Amplitude: 3.75 mV
Lead Channel Setting Pacing Amplitude: 1.75 V
Lead Channel Setting Pacing Amplitude: 2 V
Lead Channel Setting Pacing Amplitude: 2.5 V
Lead Channel Setting Pacing Pulse Width: 0.4 ms
Lead Channel Setting Pacing Pulse Width: 0.4 ms
Lead Channel Setting Sensing Sensitivity: 0.3 mV

## 2021-02-04 ENCOUNTER — Ambulatory Visit: Payer: Medicare Other | Admitting: Physical Medicine and Rehabilitation

## 2021-02-08 ENCOUNTER — Ambulatory Visit: Payer: Medicare Other

## 2021-02-08 ENCOUNTER — Ambulatory Visit
Admission: RE | Admit: 2021-02-08 | Discharge: 2021-02-08 | Disposition: A | Discharge status: Home / Self Care | Payer: Medicare Other | Source: Ambulatory Visit | Attending: Medical | Admitting: Medical

## 2021-02-08 ENCOUNTER — Other Ambulatory Visit: Payer: Self-pay

## 2021-02-08 DIAGNOSIS — Z1231 Encounter for screening mammogram for malignant neoplasm of breast: Secondary | ICD-10-CM

## 2021-02-09 ENCOUNTER — Other Ambulatory Visit: Payer: Self-pay | Admitting: Nurse Practitioner

## 2021-02-09 DIAGNOSIS — I8510 Secondary esophageal varices without bleeding: Secondary | ICD-10-CM

## 2021-02-09 DIAGNOSIS — K7469 Other cirrhosis of liver: Secondary | ICD-10-CM

## 2021-02-09 NOTE — Progress Notes (Signed)
Remote ICD transmission.   

## 2021-02-12 ENCOUNTER — Other Ambulatory Visit: Payer: Self-pay | Admitting: Cardiology

## 2021-02-17 ENCOUNTER — Ambulatory Visit
Admission: RE | Admit: 2021-02-17 | Discharge: 2021-02-17 | Disposition: A | Discharge status: Home / Self Care | Payer: Medicare Other | Source: Ambulatory Visit | Attending: Nurse Practitioner | Admitting: Nurse Practitioner

## 2021-02-17 DIAGNOSIS — K7469 Other cirrhosis of liver: Secondary | ICD-10-CM

## 2021-02-17 DIAGNOSIS — I8510 Secondary esophageal varices without bleeding: Secondary | ICD-10-CM

## 2021-03-09 ENCOUNTER — Telehealth: Payer: Self-pay

## 2021-03-09 ENCOUNTER — Other Ambulatory Visit: Payer: Self-pay

## 2021-03-09 DIAGNOSIS — K7460 Unspecified cirrhosis of liver: Secondary | ICD-10-CM

## 2021-03-09 NOTE — Telephone Encounter (Signed)
-----   Message from Marlon Pel, RN sent at 09/06/2020 12:55 PM EDT ----- Needs [name] see results 5/2 Carlean Purl

## 2021-03-09 NOTE — Telephone Encounter (Signed)
Scheduled [place] at [facility] on 03/22/21. Patient to arrive at 9:45 am and be NPO after midnight. Patient agrees to the above

## 2021-03-17 ENCOUNTER — Ambulatory Visit (HOSPITAL_COMMUNITY): Payer: Medicare Other

## 2021-03-22 ENCOUNTER — Other Ambulatory Visit: Payer: Self-pay

## 2021-03-22 ENCOUNTER — Ambulatory Visit (INDEPENDENT_AMBULATORY_CARE_PROVIDER_SITE_OTHER): Payer: Medicare Other | Admitting: Vascular Surgery

## 2021-03-22 ENCOUNTER — Encounter: Payer: Self-pay | Admitting: Vascular Surgery

## 2021-03-22 ENCOUNTER — Ambulatory Visit (HOSPITAL_COMMUNITY)
Admission: RE | Admit: 2021-03-22 | Discharge: 2021-03-22 | Disposition: A | Discharge status: Home / Self Care | Payer: Medicare Other | Source: Ambulatory Visit | Attending: Internal Medicine | Admitting: Internal Medicine

## 2021-03-22 DIAGNOSIS — N186 End stage renal disease: Secondary | ICD-10-CM | POA: Diagnosis not present

## 2021-03-22 DIAGNOSIS — K7460 Unspecified cirrhosis of liver: Secondary | ICD-10-CM | POA: Diagnosis present

## 2021-03-22 HISTORY — DX: End stage renal disease: N18.6

## 2021-03-22 NOTE — H&P (View-Only) (Signed)
Patient name: Morgan Clay MRN: [id number] DOB: 1945-10-06 Sex: female  REASON FOR VISIT: Evaluate for central stenosis in setting of left arm AVF  HPI: Morgan Clay is a 75 y.o. female with end-stage renal disease and multiple other co-morbidities that presents for evaluation of malfunctioning left arm AV fistula.  She has a left brachiobasilic fistula that was placed 09/16/2020 by Dr. Stanford Breed.  She states initially it had a very good thrill and this has since diminished.  She has had several interventions at [facility].  She is unclear about what they did.  The referral back states evaluate for central stenosis.  There are no images to review.  She is normally on dialysis Monday Wednesday Friday but her dialysis schedule is being changed for Thanksgiving and will dialyze Sunday and Tuesday next week.  She has a right IJ catheter that they are currently using for dialysis.  Past Medical History:  Diagnosis Date   A-fib [facility])    on Eliquis   AICD (automatic cardioverter/defibrillator) present 10/06/2015   Anemia    Angiodysplasia of small intestine 06/10/2020   Ileum - seen on capsule endoscopy 05/2020 - ablated at colonoscopy   ARF (acute renal failure) ([place])    Arthritis    "qwhere" (01/03/2018)   Asthma    reports mild asthma since childhood - had COPD on dx list from prior PCP   Chronic bronchitis ([place])    "get it most years; not this past year though" (01/03/2018)   Chronic kidney disease    "? stage;  followed by [facility]; [name]" (01/03/2018)   Chronic systolic congestive heart failure ([place])    Cirrhosis ([place]) 09/14/2020   dx by Dr Silvano Rusk    COVID-19 05/03/2020   DEPRESSION 12/17/2009   Annotation: PHQ-9 score = 14 done on 12/17/2009 Qualifier: Diagnosis of  By: Hassell Done FNP, [place]     Diabetes mellitus without complication ([place])    DIET CONTROLLED    Diverticulosis    Enteritis    FIBROIDS, UTERUS 03/05/2008   GI bleeding 01/03/2018   Glaucoma    Gout     Hepatitis C    HEPATITIS C - s/p treatment with Harvoni, saw hepatology, Dawn Drazek   History of blood transfusion ~ 11/2017   Hyperlipemia 12/06/2012   HYPERTENSION, BENIGN 04/24/2007   Hypothyroidism    LBBB (left bundle branch block)    Lower GI bleeding    "been dealing w/it since 07/2017" (01/03/2018)   Nonischemic cardiomyopathy ([place])    OBESITY 05/27/2009   OBSTRUCTIVE SLEEP APNEA 11/14/2007   no CPAP   OSTEOPENIA 09/30/2008   Pain and swelling of left upper extremity 08/08/2017   Personal history of other infectious and parasitic disease    Hepatitis B   Pneumonia    "several times" (01/03/2018)   Pulmonary edema, acute ([place]) 08/09/2017    Past Surgical History:  Procedure Laterality Date   APPENDECTOMY     AV FISTULA PLACEMENT Left 08/05/2020   Procedure: LEFT UPPER EXTREMITY ARTERIOVENOUS (AV) FISTULA CREATION;  Surgeon: Cherre Robins, MD;  Location: [place];  Service: Vascular;  Laterality: Left;   [place] Left 09/16/2020   Procedure: LEFT SECOND STAGE [place];  Surgeon: Cherre Robins, MD;  Location: [place];  Service: Vascular;  Laterality: Left;  PERIPHERAL NERVE BLOCK   CARDIAC CATHETERIZATION     CARDIOVERSION N/A 12/14/2014   Procedure: CARDIOVERSION;  Surgeon: Dorothy Spark, MD;  Location: [place];  Service:  Cardiovascular;  Laterality: N/A;   CARDIOVERSION N/A 02/26/2017   Procedure: CARDIOVERSION;  Surgeon: Evans Lance, MD;  Location: [place] [facility];  Service: Cardiovascular;  Laterality: N/A;   CARDIOVERSION N/A 07/24/2017   Procedure: CARDIOVERSION;  Surgeon: Acie Fredrickson Wonda Cheng, MD;  Location: [facility] ENDOSCOPY;  Service: Cardiovascular;  Laterality: N/A;   CARDIOVERSION N/A 02/12/2020   Procedure: CARDIOVERSION;  Surgeon: [name], MD;  Location: [facility] ENDOSCOPY;  Service: Cardiovascular;  Laterality: N/A;   COLONOSCOPY N/A 11/08/2017   Procedure: COLONOSCOPY;  Surgeon: Milus Banister, MD;  Location: [facility];  Service: Endoscopy;  Laterality: N/A;   COLONOSCOPY WITH PROPOFOL N/A 05/31/2020   Procedure: COLONOSCOPY WITH PROPOFOL;  Surgeon: Irene Shipper, MD;  Location: [facility] ENDOSCOPY;  Service: Endoscopy;  Laterality: N/A;   DILATION AND CURETTAGE OF UTERUS     ENTEROSCOPY N/A 03/02/2020   Procedure: ENTEROSCOPY;  Surgeon: Yetta Flock, MD;  Location[facility];  Service: Gastroenterology;  Laterality: N/A;   EP IMPLANTABLE DEVICE N/A 10/06/2015   Procedure: BIV ICD Generator Changeout;  Surgeon: [name], MD;  Location: [place] [facility];  Service: Cardiovascular;  Laterality: N/A;   ESOPHAGOGASTRODUODENOSCOPY N/A 10/26/2017   Procedure: ESOPHAGOGASTRODUODENOSCOPY (EGD);  Surgeon: Ladene Artist, MD;  Location: [facility];  Service: Endoscopy;  Laterality: N/A;   ESOPHAGOGASTRODUODENOSCOPY (EGD) WITH PROPOFOL N/A 11/07/2017   Procedure: ESOPHAGOGASTRODUODENOSCOPY (EGD) WITH PROPOFOL;  Surgeon: Milus Banister, MD;  Location: [facility];  Service: Endoscopy;  Laterality: N/A;   ESOPHAGOGASTRODUODENOSCOPY (EGD) WITH PROPOFOL N/A 05/29/2020   Procedure: ESOPHAGOGASTRODUODENOSCOPY (EGD) WITH PROPOFOL;  Surgeon: Doran Stabler, MD;  Location: [place];  Service: Gastroenterology;  Laterality: N/A;   GIVENS CAPSULE STUDY N/A 05/19/2020   Procedure: GIVENS CAPSULE STUDY;  Surgeon: [name], MD;  Location: [place];  Service: Endoscopy;  Laterality: N/A;  .adm for obs since pacemaker, PA wil enter order and see pt   GIVENS CAPSULE STUDY N/A 05/29/2020   Procedure: GIVENS CAPSULE STUDY;  Surgeon: Doran Stabler, MD;  Location: [place];  Service: Gastroenterology;  Laterality: N/A;   HEMATOMA EVACUATION Left 09/24/2020   Procedure: EVACUATION HEMATOMA ARM;  Surgeon: Rosetta Posner, MD;  Location: [facility] OR;  Service: Vascular;  Laterality: Left;   HOT HEMOSTASIS N/A 10/26/2017   Procedure: HOT HEMOSTASIS (ARGON PLASMA COAGULATION/BICAP);  Surgeon: Ladene Artist, MD;  Location: [facility];  Service: Endoscopy;  Laterality: N/A;   HOT HEMOSTASIS N/A 03/02/2020   Procedure: HOT HEMOSTASIS (ARGON PLASMA COAGULATION/BICAP);  Surgeon: Yetta Flock, MD;  Location: [facility] ENDOSCOPY;  Service: Gastroenterology;  Laterality: N/A;   HOT HEMOSTASIS N/A 05/31/2020   Procedure: HOT HEMOSTASIS (ARGON PLASMA COAGULATION/BICAP);  Surgeon: Irene Shipper, MD;  Location: [facility] ENDOSCOPY;  Service: Endoscopy;  Laterality: N/A;   INSERT / REPLACE / REMOVE PACEMAKER  ?2008   IR PERC TUN PERIT CATH WO PORT S&I /IMAG  07/05/2020   POLYPECTOMY  11/08/2017   Procedure: POLYPECTOMY;  Surgeon: Milus Banister, MD;  Location[facility];  Service: Endoscopy;;   PROCTOSCOPY N/A 05/22/2018   Procedure: RIGID PROCTOSCOPY;  Surgeon: Michael Boston, MD;  [facility];  Service: General;  Laterality: N/A;   RIGHT HEART CATH N/A 02/13/2018   Procedure: RIGHT HEART CATH;  Surgeon: [name], MD;  Location: [place] [facility];  Service: Cardiovascular;  Laterality: N/A;   RIGHT HEART CATH N/A 06/23/2020   Procedure: RIGHT HEART CATH;  Surgeon: Loralie Champagne  S, MD;  Location: [place] [facility];  Service: Cardiovascular;  Laterality: N/A;   RIGHT/LEFT HEART CATH AND CORONARY ANGIOGRAPHY N/A 12/11/2019   Procedure: RIGHT/LEFT HEART CATH AND CORONARY ANGIOGRAPHY;  Surgeon: [name], MD;  Location: [place] [facility];  Service: Cardiovascular;  Laterality: N/A;   SUBMUCOSAL TATTOO INJECTION  03/02/2020   Procedure: SUBMUCOSAL TATTOO INJECTION;  Surgeon: Yetta Flock, MD;  Location: [facility] ENDOSCOPY;  Service: Gastroenterology;;   SUBMUCOSAL TATTOO INJECTION  05/31/2020   Procedure: SUBMUCOSAL TATTOO INJECTION;  Surgeon: Irene Shipper, MD;  Location: [facility] ENDOSCOPY;  Service: Endoscopy;;   TUBAL LIGATION     XI ROBOTIC ASSISTED LOWER ANTERIOR RESECTION N/A 05/22/2018   Procedure: XI ROBOTIC ASSISTED SIGMOID COLOECTOMY MOBILIZATION OF SPLENIC FLEXURE, FIREFLY ASSESSMENT OF  PERFUSION;  Surgeon: Michael Boston, MD;  Location: [facility];  Service: General;  Laterality: N/A;  ERAS PATHWAY    Family History  Problem Relation Age of Onset   Asthma Father    Heart attack Father    Asthma Sister    Lung cancer Sister    Heart attack Mother    Stroke Brother    Colon cancer Neg Hx    Esophageal cancer Neg Hx    Pancreatic cancer Neg Hx    Stomach cancer Neg Hx    Liver disease Neg Hx     SOCIAL HISTORY: Social History   Tobacco Use   Smoking status: Never   Smokeless tobacco: Never  Substance Use Topics   Alcohol use: Not Currently    Comment: 01/03/2018 "couple drinks/month"    No Known Allergies  Current Outpatient Medications  Medication Sig Dispense Refill   amiodarone (PACERONE) 200 MG tablet Take 1 tablet (200 mg total) by mouth daily. 90 tablet 3   amoxicillin-clavulanate (AUGMENTIN) 875-125 MG tablet Take 1 tablet by mouth 2 (two) times daily. (Patient not taking: Reported on 03/22/2021) 20 tablet 0   AURYXIA 1 GM 210 MG(Fe) tablet Take 420 mg by mouth 3 (three) times daily with meals.     carvedilol (COREG) 3.125 MG tablet TAKE 1 TABLET BY MOUTH TWICE DAILY WITH A MEAL 180 tablet 0   cromolyn (OPTICROM) 4 % ophthalmic solution Place 1 drop into both eyes 2 (two) times daily.     diclofenac Sodium (VOLTAREN) 1 % GEL Apply 1 application topically daily.     gabapentin (NEURONTIN) 300 MG capsule Take 1 capsule (300 mg total) by mouth daily. 180 capsule 2   HYDROcodone-acetaminophen (NORCO/VICODIN) 5-325 MG tablet Take 1 tablet by mouth 3 (three) times daily as needed for pain. (Patient not taking: Reported on 03/22/2021)     isosorbide-hydrALAZINE (BIDIL) 20-37.5 MG tablet Take 0.5 tablets by mouth 3 (three) times daily. 126 tablet 5   levothyroxine (SYNTHROID) 137 MCG tablet Take 1 tablet (137 mcg total) by mouth daily before breakfast. 90 tablet 3   traZODone (DESYREL) 50 MG tablet Take 0.5-1 tablets (25-50 mg total) by mouth at bedtime as needed  for sleep. 30 tablet 3   No current facility-administered medications for this visit.    REVIEW OF SYSTEMS:  [X]  denotes positive finding, [ ]  denotes negative finding Cardiac  Comments:  Chest pain or chest pressure:    Shortness of breath upon exertion:    Short of breath when lying flat:    Irregular heart rhythm:        Vascular    Pain in calf, thigh, or hip brought on by ambulation:    Pain  in feet at night that wakes you up from your sleep:     Blood clot in your veins:    Leg swelling:         Pulmonary    Oxygen at home:    Productive cough:     Wheezing:         Neurologic    Sudden weakness in arms or legs:     Sudden numbness in arms or legs:     Sudden onset of difficulty speaking or slurred speech:    Temporary loss of vision in one eye:     Problems with dizziness:         Gastrointestinal    Blood in stool:     Vomited blood:         Genitourinary    Burning when urinating:     Blood in urine:        Psychiatric    Major depression:         Hematologic    Bleeding problems:    Problems with blood clotting too easily:        Skin    Rashes or ulcers:        Constitutional    Fever or chills:      PHYSICAL EXAM: Vitals:   03/22/21 1448  BP: (!) 143/61  Pulse: 62  Resp: 14  Temp: 97.7 F (36.5 C)  TempSrc: Temporal  SpO2: 100%  Weight: 128 lb 11.2 oz (58.4 kg)  Height: 4\' 11"  (1.499 m)    GENERAL: The patient is a well-nourished female, in no acute distress. The vital signs are documented above. CARDIAC: There is a regular rate and rhythm.  VASCULAR:  Left brachiobasilic fistula does have a diminished thrill and is pulsatile Left radial pulse palpable PULMONARY: No respiratory distress. ABDOMEN: Soft and non-tender. MUSCULOSKELETAL: There are no major deformities or cyanosis. NEUROLOGIC: No focal weakness or paresthesias are detected. SKIN: There are no ulcers or rashes noted. PSYCHIATRIC: The patient has a normal  affect.  DATA:   None  Assessment/Plan:  75 year old female with ESRD taht presents for evaluation of malfunctioning left arm brachiobasilic fistula placed by Dr. Stanford Breed.  I have discussed proceeding with fistulogram given I do not have any images from [facility].  The referral states evaluate for central stenosis.  Unclear whether this can be treated with further endovascular invention versus requiring some surgical revision or new fistula creation.  Discussed we will make that decision at the time of her fistulogram.  She is scheduled to dialyze on Sunday and Tuesday next week for Thanksgiving so we will try and get her scheduled around the dialysis schedule.  Risk benefits discussed.   Marty Heck, MD [facility] Office: [phone]

## 2021-03-22 NOTE — Progress Notes (Signed)
Patient name: Morgan Clay MRN: [id number] DOB: Nov 14, 1945 Sex: female  REASON FOR VISIT: Evaluate for central stenosis in setting of left arm AVF  HPI: Morgan Clay is a 75 y.o. female with end-stage renal disease and multiple other co-morbidities that presents for evaluation of malfunctioning left arm AV fistula.  She has a left brachiobasilic fistula that was placed 09/16/2020 by Dr. Stanford Breed.  She states initially it had a very good thrill and this has since diminished.  She has had several interventions at [facility].  She is unclear about what they did.  The referral back states evaluate for central stenosis.  There are no images to review.  She is normally on dialysis Monday Wednesday Friday but her dialysis schedule is being changed for Thanksgiving and will dialyze Sunday and Tuesday next week.  She has a right IJ catheter that they are currently using for dialysis.  Past Medical History:  Diagnosis Date   A-fib [facility])    on Eliquis   AICD (automatic cardioverter/defibrillator) present 10/06/2015   Anemia    Angiodysplasia of small intestine 06/10/2020   Ileum - seen on capsule endoscopy 05/2020 - ablated at colonoscopy   ARF (acute renal failure) ([place])    Arthritis    "qwhere" (01/03/2018)   Asthma    reports mild asthma since childhood - had COPD on dx list from prior PCP   Chronic bronchitis ([place])    "get it most years; not this past year though" (01/03/2018)   Chronic kidney disease    "? stage;  followed by [facility]; [name]" (01/03/2018)   Chronic systolic congestive heart failure ([place])    Cirrhosis ([place]) 09/14/2020   dx by Dr Silvano Rusk    COVID-19 05/03/2020   DEPRESSION 12/17/2009   Annotation: PHQ-9 score = 14 done on 12/17/2009 Qualifier: Diagnosis of  By: Hassell Done FNP, [place]     Diabetes mellitus without complication ([place])    DIET CONTROLLED    Diverticulosis    Enteritis    FIBROIDS, UTERUS 03/05/2008   GI bleeding 01/03/2018   Glaucoma    Gout     Hepatitis C    HEPATITIS C - s/p treatment with Harvoni, saw hepatology, Dawn Drazek   History of blood transfusion ~ 11/2017   Hyperlipemia 12/06/2012   HYPERTENSION, BENIGN 04/24/2007   Hypothyroidism    LBBB (left bundle branch block)    Lower GI bleeding    "been dealing w/it since 07/2017" (01/03/2018)   Nonischemic cardiomyopathy ([place])    OBESITY 05/27/2009   OBSTRUCTIVE SLEEP APNEA 11/14/2007   no CPAP   OSTEOPENIA 09/30/2008   Pain and swelling of left upper extremity 08/08/2017   Personal history of other infectious and parasitic disease    Hepatitis B   Pneumonia    "several times" (01/03/2018)   Pulmonary edema, acute ([place]) 08/09/2017    Past Surgical History:  Procedure Laterality Date   APPENDECTOMY     AV FISTULA PLACEMENT Left 08/05/2020   Procedure: LEFT UPPER EXTREMITY ARTERIOVENOUS (AV) FISTULA CREATION;  Surgeon: Cherre Robins, MD;  Location: [place];  Service: Vascular;  Laterality: Left;   [place] Left 09/16/2020   Procedure: LEFT SECOND STAGE [place];  Surgeon: Cherre Robins, MD;  Location: [place];  Service: Vascular;  Laterality: Left;  PERIPHERAL NERVE BLOCK   CARDIAC CATHETERIZATION     CARDIOVERSION N/A 12/14/2014   Procedure: CARDIOVERSION;  Surgeon: Dorothy Spark, MD;  Location: [place];  Service:  Cardiovascular;  Laterality: N/A;   CARDIOVERSION N/A 02/26/2017   Procedure: CARDIOVERSION;  Surgeon: Evans Lance, MD;  Location: [place] CV LAB;  Service: Cardiovascular;  Laterality: N/A;   CARDIOVERSION N/A 07/24/2017   Procedure: CARDIOVERSION;  Surgeon: Acie Fredrickson Wonda Cheng, MD;  Location: [facility] ENDOSCOPY;  Service: Cardiovascular;  Laterality: N/A;   CARDIOVERSION N/A 02/12/2020   Procedure: CARDIOVERSION;  Surgeon: [name], MD;  Location: [facility] ENDOSCOPY;  Service: Cardiovascular;  Laterality: N/A;   COLONOSCOPY N/A 11/08/2017   Procedure: COLONOSCOPY;  Surgeon: Milus Banister, MD;  Location: [facility]  ENDOSCOPY;  Service: Endoscopy;  Laterality: N/A;   COLONOSCOPY WITH PROPOFOL N/A 05/31/2020   Procedure: COLONOSCOPY WITH PROPOFOL;  Surgeon: Irene Shipper, MD;  Location: [facility] ENDOSCOPY;  Service: Endoscopy;  Laterality: N/A;   DILATION AND CURETTAGE OF UTERUS     ENTEROSCOPY N/A 03/02/2020   Procedure: ENTEROSCOPY;  Surgeon: Yetta Flock, MD;  Location: [facility] ENDOSCOPY;  Service: Gastroenterology;  Laterality: N/A;   EP IMPLANTABLE DEVICE N/A 10/06/2015   Procedure: BIV ICD Generator Changeout;  Surgeon: [name], MD;  Location: [place] CV LAB;  Service: Cardiovascular;  Laterality: N/A;   ESOPHAGOGASTRODUODENOSCOPY N/A 10/26/2017   Procedure: ESOPHAGOGASTRODUODENOSCOPY (EGD);  Surgeon: Ladene Artist, MD;  Location: [facility];  Service: Endoscopy;  Laterality: N/A;   ESOPHAGOGASTRODUODENOSCOPY (EGD) WITH PROPOFOL N/A 11/07/2017   Procedure: ESOPHAGOGASTRODUODENOSCOPY (EGD) WITH PROPOFOL;  Surgeon: Milus Banister, MD;  [facility] ENDOS[facility];  Service: Endoscopy;  Laterality: N/A;   ESOPHAGOGASTRODUODENOSCOPY (EGD) WITH PROPOFOL N/A 05/29/2020   Procedure: ESOPHAGOGASTRODUODENOSCOPY (EGD) WITH PROPOFOL;  Surgeon: Doran Stabler, MD;  Location: [place];  Service: Gastroenterology;  Laterality: N/A;   GIVENS CAPSULE STUDY N/A 05/19/2020   Procedure: GIVENS CAPSULE STUDY;  Surgeon: [name], MD;  Location: [place];  Service: Endoscopy;  Laterality: N/A;  .adm for obs since pacemaker, PA wil enter order and see pt   GIVENS CAPSULE STUDY N/A 05/29/2020   Procedure: GIVENS CAPSULE STUDY;  Surgeon: Doran Stabler, MD;  Location: [place];  Service: Gastroenterology;  Laterality: N/A;   HEMATOMA EVACUATION Left 09/24/2020   Procedure: EVACUATION HEMATOMA ARM;  Surgeon: Rosetta Posner, MD;  Location: [facility] OR;  Service: Vascular;  Laterality: Left;   HOT HEMOSTASIS N/A 10/26/2017   Procedure: HOT HEMOSTASIS (ARGON PLASMA COAGULATION/BICAP);  Surgeon: Ladene Artist, MD;  Location: [facility];  Service: Endoscopy;  Laterality: N/A;   HOT HEMOSTASIS N/A 03/02/2020   Procedure: HOT HEMOSTASIS (ARGON PLASMA COAGULATION/BICAP);  Surgeon: Yetta Flock, MD;  Location: [facility] ENDOSCOPY;  Service: Gastroenterology;  Laterality: N/A;   HOT HEMOSTASIS N/A 05/31/2020   Procedure: HOT HEMOSTASIS (ARGON PLASMA COAGULATION/BICAP);  Surgeon: Irene Shipper, MD;  Location: [facility] ENDOSCOPY;  Service: Endoscopy;  Laterality: N/A;   INSERT / REPLACE / REMOVE PACEMAKER  ?2008   IR PERC TUN PERIT CATH WO PORT S&I /IMAG  07/05/2020   POLYPECTOMY  11/08/2017   Procedure: POLYPECTOMY;  Surgeon: Milus Banister, MD;  [facility];  Service: Endoscopy;;   PROCTOSCOPY N/A 05/22/2018   Procedure: RIGID PROCTOSCOPY;  Surgeon: Michael Boston, MD;  [facility];  Service: General;  Laterality: N/A;   RIGHT HEART CATH N/A 02/13/2018   Procedure: RIGHT HEART CATH;  Surgeon: [name], MD;  Location: [place] CV LAB;  Service: Cardiovascular;  Laterality: N/A;   RIGHT HEART CATH N/A 06/23/2020   Procedure: RIGHT HEART CATH;  Surgeon: Loralie Champagne  S, MD;  Location: [place] CV LAB;  Service: Cardiovascular;  Laterality: N/A;   RIGHT/LEFT HEART CATH AND CORONARY ANGIOGRAPHY N/A 12/11/2019   Procedure: RIGHT/LEFT HEART CATH AND CORONARY ANGIOGRAPHY;  Surgeon: [name], MD;  Location: [place] CV LAB;  Service: Cardiovascular;  Laterality: N/A;   SUBMUCOSAL TATTOO INJECTION  03/02/2020   Procedure: SUBMUCOSAL TATTOO INJECTION;  Surgeon: Yetta Flock, MD;  Location: [facility] ENDOSCOPY;  Service: Gastroenterology;;   SUBMUCOSAL TATTOO INJECTION  05/31/2020   Procedure: SUBMUCOSAL TATTOO INJECTION;  Surgeon: Irene Shipper, MD;  Location: [facility] ENDOSCOPY;  Service: Endoscopy;;   TUBAL LIGATION     XI ROBOTIC ASSISTED LOWER ANTERIOR RESECTION N/A 05/22/2018   Procedure: XI ROBOTIC ASSISTED SIGMOID COLOECTOMY MOBILIZATION OF SPLENIC FLEXURE, FIREFLY ASSESSMENT OF  PERFUSION;  Surgeon: Michael Boston, MD;  [facility];  Service: General;  Laterality: N/A;  ERAS PATHWAY    Family History  Problem Relation Age of Onset   Asthma Father    Heart attack Father    Asthma Sister    Lung cancer Sister    Heart attack Mother    Stroke Brother    Colon cancer Neg Hx    Esophageal cancer Neg Hx    Pancreatic cancer Neg Hx    Stomach cancer Neg Hx    Liver disease Neg Hx     SOCIAL HISTORY: Social History   Tobacco Use   Smoking status: Never   Smokeless tobacco: Never  Substance Use Topics   Alcohol use: Not Currently    Comment: 01/03/2018 "couple drinks/month"    No Known Allergies  Current Outpatient Medications  Medication Sig Dispense Refill   amiodarone (PACERONE) 200 MG tablet Take 1 tablet (200 mg total) by mouth daily. 90 tablet 3   amoxicillin-clavulanate (AUGMENTIN) 875-125 MG tablet Take 1 tablet by mouth 2 (two) times daily. (Patient not taking: Reported on 03/22/2021) 20 tablet 0   AURYXIA 1 GM 210 MG(Fe) tablet Take 420 mg by mouth 3 (three) times daily with meals.     carvedilol (COREG) 3.125 MG tablet TAKE 1 TABLET BY MOUTH TWICE DAILY WITH A MEAL 180 tablet 0   cromolyn (OPTICROM) 4 % ophthalmic solution Place 1 drop into both eyes 2 (two) times daily.     diclofenac Sodium (VOLTAREN) 1 % GEL Apply 1 application topically daily.     gabapentin (NEURONTIN) 300 MG capsule Take 1 capsule (300 mg total) by mouth daily. 180 capsule 2   HYDROcodone-acetaminophen (NORCO/VICODIN) 5-325 MG tablet Take 1 tablet by mouth 3 (three) times daily as needed for pain. (Patient not taking: Reported on 03/22/2021)     isosorbide-hydrALAZINE (BIDIL) 20-37.5 MG tablet Take 0.5 tablets by mouth 3 (three) times daily. 126 tablet 5   levothyroxine (SYNTHROID) 137 MCG tablet Take 1 tablet (137 mcg total) by mouth daily before breakfast. 90 tablet 3   traZODone (DESYREL) 50 MG tablet Take 0.5-1 tablets (25-50 mg total) by mouth at bedtime as needed  for sleep. 30 tablet 3   No current facility-administered medications for this visit.    REVIEW OF SYSTEMS:  [X]  denotes positive finding, [ ]  denotes negative finding Cardiac  Comments:  Chest pain or chest pressure:    Shortness of breath upon exertion:    Short of breath when lying flat:    Irregular heart rhythm:        Vascular    Pain in calf, thigh, or hip brought on by ambulation:    Pain  in feet at night that wakes you up from your sleep:     Blood clot in your veins:    Leg swelling:         Pulmonary    Oxygen at home:    Productive cough:     Wheezing:         Neurologic    Sudden weakness in arms or legs:     Sudden numbness in arms or legs:     Sudden onset of difficulty speaking or slurred speech:    Temporary loss of vision in one eye:     Problems with dizziness:         Gastrointestinal    Blood in stool:     Vomited blood:         Genitourinary    Burning when urinating:     Blood in urine:        Psychiatric    Major depression:         Hematologic    Bleeding problems:    Problems with blood clotting too easily:        Skin    Rashes or ulcers:        Constitutional    Fever or chills:      PHYSICAL EXAM: Vitals:   03/22/21 1448  BP: (!) 143/61  Pulse: 62  Resp: 14  Temp: 97.7 F (36.5 C)  TempSrc: Temporal  SpO2: 100%  Weight: 128 lb 11.2 oz (58.4 kg)  Height: 4\' 11"  (1.499 m)    GENERAL: The patient is a well-nourished female, in no acute distress. The vital signs are documented above. CARDIAC: There is a regular rate and rhythm.  VASCULAR:  Left brachiobasilic fistula does have a diminished thrill and is pulsatile Left radial pulse palpable PULMONARY: No respiratory distress. ABDOMEN: Soft and non-tender. MUSCULOSKELETAL: There are no major deformities or cyanosis. NEUROLOGIC: No focal weakness or paresthesias are detected. SKIN: There are no ulcers or rashes noted. PSYCHIATRIC: The patient has a normal  affect.  DATA:   None  Assessment/Plan:  75 year old female with ESRD taht presents for evaluation of malfunctioning left arm brachiobasilic fistula placed by Dr. Stanford Breed.  I have discussed proceeding with fistulogram given I do not have any images from [facility].  The referral states evaluate for central stenosis.  Unclear whether this can be treated with further endovascular invention versus requiring some surgical revision or new fistula creation.  Discussed we will make that decision at the time of her fistulogram.  She is scheduled to dialyze on Sunday and Tuesday next week for Thanksgiving so we will try and get her scheduled around the dialysis schedule.  Risk benefits discussed.   Marty Heck, MD [facility] Office: [phone]

## 2021-03-28 ENCOUNTER — Ambulatory Visit (HOSPITAL_COMMUNITY)
Admission: RE | Admit: 2021-03-28 | Discharge: 2021-03-28 | Disposition: A | Discharge status: Home / Self Care | Payer: Medicare Other | Source: Ambulatory Visit | Attending: Vascular Surgery | Admitting: Vascular Surgery

## 2021-03-28 ENCOUNTER — Ambulatory Visit (HOSPITAL_COMMUNITY)
Admission: RE | Disposition: A | Discharge status: Home / Self Care | Payer: Self-pay | Source: Ambulatory Visit | Attending: Vascular Surgery

## 2021-03-28 ENCOUNTER — Other Ambulatory Visit: Payer: Self-pay

## 2021-03-28 DIAGNOSIS — E1122 Type 2 diabetes mellitus with diabetic chronic kidney disease: Secondary | ICD-10-CM | POA: Insufficient documentation

## 2021-03-28 DIAGNOSIS — N186 End stage renal disease: Secondary | ICD-10-CM | POA: Diagnosis not present

## 2021-03-28 DIAGNOSIS — I132 Hypertensive heart and chronic kidney disease with heart failure and with stage 5 chronic kidney disease, or end stage renal disease: Secondary | ICD-10-CM | POA: Insufficient documentation

## 2021-03-28 DIAGNOSIS — T82858A Stenosis of vascular prosthetic devices, implants and grafts, initial encounter: Secondary | ICD-10-CM | POA: Insufficient documentation

## 2021-03-28 DIAGNOSIS — I5022 Chronic systolic (congestive) heart failure: Secondary | ICD-10-CM | POA: Insufficient documentation

## 2021-03-28 DIAGNOSIS — Y841 Kidney dialysis as the cause of abnormal reaction of the patient, or of later complication, without mention of misadventure at the time of the procedure: Secondary | ICD-10-CM | POA: Diagnosis not present

## 2021-03-28 DIAGNOSIS — Z992 Dependence on renal dialysis: Secondary | ICD-10-CM | POA: Insufficient documentation

## 2021-03-28 HISTORY — PX: PERIPHERAL VASCULAR INTERVENTION: CATH118257

## 2021-03-28 HISTORY — PX: A/V FISTULAGRAM: CATH118298

## 2021-03-28 LAB — POCT I-STAT, CHEM 8
BUN: 24 mg/dL — ABNORMAL HIGH (ref 8–23)
Calcium, Ion: 1.02 mmol/L — ABNORMAL LOW (ref 1.15–1.40)
Chloride: 95 mmol/L — ABNORMAL LOW (ref 98–111)
Creatinine, Ser: 5 mg/dL — ABNORMAL HIGH (ref 0.44–1.00)
Glucose, Bld: 102 mg/dL — ABNORMAL HIGH (ref 70–99)
HCT: 38 % (ref 36.0–46.0)
Hemoglobin: 12.9 g/dL (ref 12.0–15.0)
Potassium: 4.6 mmol/L (ref 3.5–5.1)
Sodium: 136 mmol/L (ref 135–145)
TCO2: 33 mmol/L — ABNORMAL HIGH (ref 22–32)

## 2021-03-28 SURGERY — A/V FISTULAGRAM
Anesthesia: LOCAL

## 2021-03-28 MED ORDER — SODIUM CHLORIDE 0.9 % IV SOLN: 250.0000 mL | INTRAVENOUS | Status: DC | PRN

## 2021-03-28 MED ORDER — LIDOCAINE HCL (PF) 1 % IJ SOLN
INTRAMUSCULAR | Status: DC | PRN
  Administered 2021-03-28: 2 mL via INTRADERMAL

## 2021-03-28 MED ORDER — FENTANYL CITRATE (PF) 100 MCG/2ML IJ SOLN
INTRAMUSCULAR | Status: AC
  Filled 2021-03-28: qty 2

## 2021-03-28 MED ORDER — LIDOCAINE HCL (PF) 1 % IJ SOLN
INTRAMUSCULAR | Status: AC
  Filled 2021-03-28: qty 30

## 2021-03-28 MED ORDER — SODIUM CHLORIDE 0.9% FLUSH: 3.0000 mL | INTRAVENOUS | Status: DC | PRN

## 2021-03-28 MED ORDER — FENTANYL CITRATE (PF) 100 MCG/2ML IJ SOLN
INTRAMUSCULAR | Status: DC | PRN
  Administered 2021-03-28 (×2): 50 ug via INTRAVENOUS

## 2021-03-28 MED ORDER — HEPARIN (PORCINE) IN NACL 1000-0.9 UT/500ML-% IV SOLN
INTRAVENOUS | Status: AC
  Filled 2021-03-28: qty 500

## 2021-03-28 MED ORDER — IODIXANOL 320 MG/ML IV SOLN
INTRAVENOUS | Status: DC | PRN
  Administered 2021-03-28: 35 mL via INTRAVENOUS

## 2021-03-28 MED ORDER — SODIUM CHLORIDE 0.9% FLUSH: 3.0000 mL | Freq: Two times a day (BID) | INTRAVENOUS | Status: DC

## 2021-03-28 MED ORDER — HEPARIN (PORCINE) IN NACL 1000-0.9 UT/500ML-% IV SOLN
INTRAVENOUS | Status: DC | PRN
  Administered 2021-03-28: 500 mL

## 2021-03-28 SURGICAL SUPPLY — 19 items
BALLN LUTONIX AV 12X40X75 (BALLOONS) ×3
BALLN MUSTANG 10.0X40 75 (BALLOONS) ×3
BALLN MUSTANG 12.0X40 75 (BALLOONS) ×3
BALLOON LUTONIX AV 12X40X75 (BALLOONS) ×2 IMPLANT
BALLOON MUSTANG 10.0X40 75 (BALLOONS) ×2 IMPLANT
BALLOON MUSTANG 12.0X40 75 (BALLOONS) ×2 IMPLANT
COVER DOME SNAP 22 D (MISCELLANEOUS) ×3 IMPLANT
GLIDEWIRE NITREX 0.018X80X5 (WIRE) ×3
GUIDEWIRE NITREX 0.018X80X5 (WIRE) ×2 IMPLANT
KIT ENCORE 26 ADVANTAGE (KITS) ×3 IMPLANT
KIT MICROPUNCTURE NIT STIFF (SHEATH) ×3 IMPLANT
PROTECTION STATION PRESSURIZED (MISCELLANEOUS) ×3
SHEATH PINNACLE R/O II 7F 4CM (SHEATH) ×3 IMPLANT
SHEATH PROBE COVER 6X72 (BAG) ×6 IMPLANT
STATION PROTECTION PRESSURIZED (MISCELLANEOUS) ×2 IMPLANT
STOPCOCK MORSE 400PSI 3WAY (MISCELLANEOUS) ×3 IMPLANT
TRAY PV CATH (CUSTOM PROCEDURE TRAY) ×3 IMPLANT
TUBING CIL FLEX 10 FLL-RA (TUBING) ×3 IMPLANT
WIRE BENTSON .035X145CM (WIRE) ×3 IMPLANT

## 2021-03-28 NOTE — Op Note (Signed)
    Patient name: Morgan Clay MRN: [id number] DOB: [birth year] Sex: female  03/28/2021 Pre-operative Diagnosis: End-stage renal disease, malfunction left arm AV fistula Post-operative diagnosis:  Same Surgeon:  Eda Paschal. Donzetta Matters, MD Procedure Performed: 1.  Ultrasound-guided cannulation left arm AV fistula 2.  Left upper extremity fistulogram 3.  Drug-coated balloon angioplasty of left subclavian artery with 12 mm Lutonix  Indications: 75 year old female with history of end-stage renal disease.  Previously has a left brachial artery to basilic vein fistula placed in May.  She has undergone 8 mm balloon angioplasty with [facility].  She is now indicated for repeat fistulogram with possible further intervention.  Findings: The fistula was patent with a thrill.  After initial fistulogram there were several collaterals filling in the neck with at least an 80% stenosis where the pacer wires join at the level of the first rib.  After balloon angioplasty with a 12 mm drug-coated balloon there was much improved flow with less than 20% residual stenosis and the collaterals filled much less briskly we did not visualize the collaterals in the neck.  If patient continues to have issues she may require ligation of the fistula and conversion to a right arm access.   Procedure:  The patient was identified in the holding area and taken to room 8.  The patient was then placed supine on the table and prepped and draped in the usual sterile fashion.  A time out was called.  Ultrasound was used to evaluate the left arm AV fistula.  There is no signs 1% lidocaine cannulated micropuncture needle followed by wire sheath.  Left upper extremity fistulogram was performed.  With the above findings we elected to proceed.  We placed a Bentson wire followed by a 7 [place] sheath.  We then placed initially at 10 mm [place] balloon and inflated this to nominal pressure.  We attempted retrograde imaging at this time ultimately  had to apply pressure to get retrograde imaging.  There was approximately a 30% inflow stenosis but the inflow was very strong.  I elected for no intervention of this.  I then dilated with a 12 mm balloon at nominal pressure and a 12 mm drug-coated balloon at nominal pressure for 3 minutes.  Completion demonstrated much improved flow with decreased filling of the collaterals.  Satisfied with this we remove the sheath and suture-ligated the cannulation site.  She tolerated procedure without any complication.  Contrast: 30 cc     Quintara Bost C. Donzetta Matters, MD [facility] [place] Office: [phone] Pager: [phone]

## 2021-03-28 NOTE — Interval H&P Note (Signed)
History and Physical Interval Note:  03/28/2021 7:10 AM  Morgan Clay  has presented today for surgery, with the diagnosis of instage renal.  The various methods of treatment have been discussed with the patient and family. After consideration of risks, benefits and other options for treatment, the patient has consented to  Procedure(s): A/V FISTULAGRAM (N/A) as a surgical intervention.  The patient's history has been reviewed, patient examined, no change in status, stable for surgery.  I have reviewed the patient's chart and labs.  Questions were answered to the patient's satisfaction.     Servando Snare

## 2021-03-29 ENCOUNTER — Encounter (HOSPITAL_COMMUNITY): Payer: Self-pay | Admitting: Vascular Surgery

## 2021-03-29 MED FILL — Lidocaine HCl Local Preservative Free (PF) Inj 1%: INTRAMUSCULAR | Qty: 30 | Status: AC

## 2021-03-30 ENCOUNTER — Encounter (HOSPITAL_COMMUNITY): Payer: Self-pay | Admitting: Vascular Surgery

## 2021-05-03 ENCOUNTER — Ambulatory Visit (INDEPENDENT_AMBULATORY_CARE_PROVIDER_SITE_OTHER): Payer: Medicare Other

## 2021-05-03 DIAGNOSIS — I428 Other cardiomyopathies: Secondary | ICD-10-CM

## 2021-05-03 LAB — CUP PACEART REMOTE DEVICE CHECK
Battery Remaining Longevity: 14 mo
Battery Voltage: 2.89 V
Brady Statistic AP VP Percent: 4.32 %
Brady Statistic AP VS Percent: 0 %
Brady Statistic AS VP Percent: 95.64 %
Brady Statistic AS VS Percent: 0.03 %
Brady Statistic RA Percent Paced: 4.24 %
Brady Statistic RV Percent Paced: 99.88 %
Date Time Interrogation Session: 20221227001703
HighPow Impedance: 47 Ohm
HighPow Impedance: 76 Ohm
Implantable Lead Implant Date: 20080818
Implantable Lead Implant Date: 20080818
Implantable Lead Implant Date: 20080818
Implantable Lead Location: 753858
Implantable Lead Location: 753859
Implantable Lead Location: 753860
Implantable Lead Model: 4193
Implantable Lead Model: 5076
Implantable Lead Model: 6947
Implantable Pulse Generator Implant Date: 20170531
Lead Channel Impedance Value: 304 Ohm
Lead Channel Impedance Value: 342 Ohm
Lead Channel Impedance Value: 4047 Ohm
Lead Channel Impedance Value: 4047 Ohm
Lead Channel Impedance Value: 437 Ohm
Lead Channel Impedance Value: 437 Ohm
Lead Channel Pacing Threshold Amplitude: 0.875 V
Lead Channel Pacing Threshold Amplitude: 1 V
Lead Channel Pacing Threshold Amplitude: 1.5 V
Lead Channel Pacing Threshold Pulse Width: 0.4 ms
Lead Channel Pacing Threshold Pulse Width: 0.4 ms
Lead Channel Pacing Threshold Pulse Width: 0.4 ms
Lead Channel Sensing Intrinsic Amplitude: 10.625 mV
Lead Channel Sensing Intrinsic Amplitude: 10.625 mV
Lead Channel Sensing Intrinsic Amplitude: 3.375 mV
Lead Channel Sensing Intrinsic Amplitude: 3.375 mV
Lead Channel Setting Pacing Amplitude: 1.75 V
Lead Channel Setting Pacing Amplitude: 2 V
Lead Channel Setting Pacing Amplitude: 2.5 V
Lead Channel Setting Pacing Pulse Width: 0.4 ms
Lead Channel Setting Pacing Pulse Width: 0.4 ms
Lead Channel Setting Sensing Sensitivity: 0.3 mV

## 2021-05-06 ENCOUNTER — Encounter: Payer: Self-pay | Admitting: Neurology

## 2021-05-08 ENCOUNTER — Other Ambulatory Visit: Payer: Self-pay | Admitting: Cardiology

## 2021-05-13 ENCOUNTER — Telehealth: Payer: Self-pay | Admitting: Medical

## 2021-05-13 NOTE — Progress Notes (Signed)
Remote ICD transmission.   

## 2021-05-13 NOTE — Telephone Encounter (Signed)
Left message for patient to call back and schedule Medicare Annual Wellness Visit (AWV) in office.  ° °If not able to come in office, please offer to do virtually or by telephone.  Left office number and my jabber #[phone]. ° °Due for AWVI ° °Please schedule at anytime with Nurse Health Advisor. °  °

## 2021-05-24 ENCOUNTER — Encounter: Payer: Self-pay | Admitting: Oncology

## 2021-05-25 ENCOUNTER — Ambulatory Visit: Payer: Medicare Other | Admitting: Internal Medicine

## 2021-05-30 ENCOUNTER — Other Ambulatory Visit: Payer: Commercial Managed Care - HMO

## 2021-05-30 ENCOUNTER — Telehealth: Payer: Self-pay

## 2021-05-30 ENCOUNTER — Other Ambulatory Visit: Payer: Self-pay

## 2021-05-30 ENCOUNTER — Encounter: Payer: Self-pay | Admitting: Oncology

## 2021-05-30 DIAGNOSIS — Z515 Encounter for palliative care: Secondary | ICD-10-CM

## 2021-05-30 NOTE — Progress Notes (Signed)
COMMUNITY PALLIATIVE CARE SW NOTE  PATIENT NAME: Morgan Clay DOB: 1945/09/24 MRN: [id number]  PRIMARY CARE PROVIDER: Mackie Pai, PA-C  RESPONSIBLE PARTY:  Acct ID - Guarantor Home Phone Work Phone Relationship Acct Type  [id number] Morgan Clay, JIRON* [phone]  Self P/F     [address], [place], [place] [address]   Due to the COVID-19 crisis, this virtual check-in visit was done via telephone from my office and it was initiated and consent by this patient and or family  SOCIAL WORK TELEPHONIC ENCOUNTER  (2:25 pm) PC SW completed a follow-up visit with patient. Patient advised that she was doing well. She continues to eat well and and maintain her independence.  She report that she will know if she will be notified by phone on 1/26 if she will getting her kidney transplant on 06/16/21 in [place]. SW asked her she had any needs that the palliative care team could address. She stated no, but thanked SW for the call.  No other concerns noted.   [address] [place], [place]

## 2021-05-30 NOTE — Telephone Encounter (Signed)
(  3:01 pm) PC SW left a message for patient and her daughter requesting a call back for check-in status update.

## 2021-05-31 ENCOUNTER — Encounter: Payer: Self-pay | Admitting: Internal Medicine

## 2021-05-31 ENCOUNTER — Ambulatory Visit: Payer: Medicare Other | Admitting: Internal Medicine

## 2021-05-31 ENCOUNTER — Telehealth: Payer: Self-pay

## 2021-05-31 VITALS — BP 130/64 | HR 62 | Ht 59.0 in | Wt 133.4 lb

## 2021-05-31 DIAGNOSIS — B1920 Unspecified viral hepatitis C without hepatic coma: Secondary | ICD-10-CM | POA: Diagnosis not present

## 2021-05-31 DIAGNOSIS — K7469 Other cirrhosis of liver: Secondary | ICD-10-CM | POA: Diagnosis not present

## 2021-05-31 NOTE — Progress Notes (Signed)
Morgan Clay 76 y.o. [birth year] [id number]  Assessment & Plan:   Encounter Diagnosis  Name Primary?   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  I will be available as her gastroenterologist otherwise but she will continue her liver follow-up in the liver clinic and see me as needed.  She sees Ms.Drazek in the [facility] in April.  CC: Saguier, Percell Miller, PA-C Roosevelt Locks, NP    Subjective:   Chief Complaint: Follow-up of cirrhosis  HPI 76 year old African-American woman with hepatitis C induced cirrhosis (compensated), end-stage renal disease on hemodialysis and history of terminal ileum AVM and bleeding.  Here for follow-up last seen in May 2022.  She was treated for hepatitis C in the [facility].  She had an ultrasound in October and then again 1 in November of last year that showed nodular liver but no lesions.  Looks like both [employer] and myself ordered these and we were unaware that they were happening.  EGD 1/22 without abnormality including no varices Colonoscopy January 2022 with ablation of the terminal ileal AVM that was found on a capsule endoscopy just prior No Known Allergies Current Meds  Medication Sig   amiodarone (PACERONE) 200 MG tablet Take 1 tablet (200 mg total) by mouth daily.   AURYXIA 1 GM 210 MG(Fe) tablet Take 420 mg by mouth 3 (three) times daily with meals.   carvedilol (COREG) 3.125 MG tablet TAKE 1 TABLET BY MOUTH TWICE DAILY WITH A MEAL   gabapentin (NEURONTIN) 300 MG capsule Take 1 capsule (300 mg total) by mouth daily. (Patient taking differently: Take 300 mg by mouth 2 (two) times daily.)   isosorbide-hydrALAZINE (BIDIL) 20-37.5 MG tablet Take 0.5 tablets by mouth 3 (three) times daily.   levothyroxine (SYNTHROID) 137 MCG tablet Take 1 tablet (137 mcg total) by mouth daily before  breakfast.   lidocaine-prilocaine (EMLA) cream 1 application once.   traZODone (DESYREL) 50 MG tablet Take 0.5-1 tablets (25-50 mg total) by mouth at bedtime as needed for sleep.   Past Medical History:  Diagnosis Date   A-fib ([place])    on Eliquis   AICD (automatic cardioverter/defibrillator) present 10/06/2015   Anemia    Angiodysplasia of small intestine 06/10/2020   Ileum - seen on capsule endoscopy 05/2020 - ablated at colonoscopy   ARF (acute renal failure) ([place])    Arthritis    "qwhere" (01/03/2018)   Asthma    reports mild asthma since childhood - had COPD on dx list from prior PCP   Chronic bronchitis ([place])    "get it most years; not this past year though" (01/03/2018)   Chronic kidney disease    "? stage;  followed by [facility]; [name]" (01/03/2018)   Chronic systolic congestive heart failure ([place])    Cirrhosis ([place]) 09/14/2020   dx by Dr Silvano Rusk    COVID-19 05/03/2020   DEPRESSION 12/17/2009   Annotation: PHQ-9 score = 14 done on 12/17/2009 Qualifier: Diagnosis of  By: Hassell Done FNP, [place]     Diabetes mellitus without complication ([place])    DIET CONTROLLED    Diverticulosis    Enteritis    FIBROIDS, UTERUS 03/05/2008   GI bleeding 01/03/2018   Glaucoma    Gout    Hepatitis C    HEPATITIS C - s/p treatment with Harvoni, saw hepatology, Roosevelt Locks  History of blood transfusion ~ 11/2017   Hyperlipemia 12/06/2012   HYPERTENSION, BENIGN 04/24/2007   Hypothyroidism    LBBB (left bundle branch block)    Lower GI bleeding    "been dealing w/it since 07/2017" (01/03/2018)   Nonischemic cardiomyopathy ([place])    OBESITY 05/27/2009   OBSTRUCTIVE SLEEP APNEA 11/14/2007   no CPAP   OSTEOPENIA 09/30/2008   Pain and swelling of left upper extremity 08/08/2017   Personal history of other infectious and parasitic disease    Hepatitis B   Pneumonia    "several times" (01/03/2018)   Pulmonary edema, acute ([place]) 08/09/2017   Past Surgical History:  Procedure Laterality Date    A/V FISTULAGRAM N/A 03/28/2021   Procedure: A/V FISTULAGRAM;  Surgeon: Waynetta Sandy, MD;  Location: [place] [facility];  Service: Cardiovascular;  Laterality: N/A;   APPENDECTOMY     AV FISTULA PLACEMENT Left 08/05/2020   Procedure: LEFT UPPER EXTREMITY ARTERIOVENOUS (AV) FISTULA CREATION;  Surgeon: Cherre Robins, MD;  Location: [place];  Service: Vascular;  Laterality: Left;   [place] Left 09/16/2020   Procedure: LEFT SECOND STAGE [place];  Surgeon: Cherre Robins, MD;  Location: [place];  Service: Vascular;  Laterality: Left;  PERIPHERAL NERVE BLOCK   CARDIAC CATHETERIZATION     CARDIOVERSION N/A 12/14/2014   Procedure: CARDIOVERSION;  Surgeon: Dorothy Spark, MD;  Location: [place];  Service: Cardiovascular;  Laterality: N/A;   CARDIOVERSION N/A 02/26/2017   Procedure: CARDIOVERSION;  Surgeon: Evans Lance, MD;  Location: [place] [facility];  Service: Cardiovascular;  Laterality: N/A;   CARDIOVERSION N/A 07/24/2017   Procedure: CARDIOVERSION;  Surgeon: Acie Fredrickson Wonda Cheng, MD;  Location: [facility] ENDOSCOPY;  Service: Cardiovascular;  Laterality: N/A;   CARDIOVERSION N/A 02/12/2020   Procedure: CARDIOVERSION;  Surgeon: [name], MD;  Location: [facility] ENDOSCOPY;  Service: Cardiovascular;  Laterality: N/A;   COLONOSCOPY N/A 11/08/2017   Procedure: COLONOSCOPY;  Surgeon: Milus Banister, MD;  Location: [facility];  Service: Endoscopy;  Laterality: N/A;   COLONOSCOPY WITH PROPOFOL N/A 05/31/2020   Procedure: COLONOSCOPY WITH PROPOFOL;  Surgeon: Irene Shipper, MD;  Location: [facility] ENDOSCOPY;  Service: Endoscopy;  Laterality: N/A;   DILATION AND CURETTAGE OF UTERUS     ENTEROSCOPY N/A 03/02/2020   Procedure: ENTEROSCOPY;  Surgeon: Yetta Flock, MD;  Location: [facility] ENDOSCOPY;  Service: Gastroenterology;  Laterality: N/A;   EP IMPLANTABLE DEVICE N/A 10/06/2015   Procedure: BIV ICD Generator Changeout;  Surgeon: [name], MD;   Location: [place] [facility];  Service: Cardiovascular;  Laterality: N/A;   ESOPHAGOGASTRODUODENOSCOPY N/A 10/26/2017   Procedure: ESOPHAGOGASTRODUODENOSCOPY (EGD);  Surgeon: Ladene Artist, MD;  Location: [facility];  Service: Endoscopy;  Laterality: N/A;   ESOPHAGOGASTRODUODENOSCOPY (EGD) WITH PROPOFOL N/A 11/07/2017   Procedure: ESOPHAGOGASTRODUODENOSCOPY (EGD) WITH PROPOFOL;  Surgeon: Milus Banister, MD;  [facility];  Service: Endoscopy;  Laterality: N/A;   ESOPHAGOGASTRODUODENOSCOPY (EGD) WITH PROPOFOL N/A 05/29/2020   Procedure: ESOPHAGOGASTRODUODENOSCOPY (EGD) WITH PROPOFOL;  Surgeon: Doran Stabler, MD;  Location: [place];  Service: Gastroenterology;  Laterality: N/A;   GIVENS CAPSULE STUDY N/A 05/19/2020   Procedure: GIVENS CAPSULE STUDY;  Surgeon: [name], MD;  Location: [place];  Service: Endoscopy;  Laterality: N/A;  .adm for obs since pacemaker, PA wil enter order and see pt   GIVENS CAPSULE STUDY N/A 05/29/2020   Procedure: GIVENS CAPSULE STUDY;  Surgeon: Doran Stabler, MD;  Location: [facility] ENDOSCOPY;  Service: Gastroenterology;  Laterality: N/A;   HEMATOMA EVACUATION Left 09/24/2020   Procedure: EVACUATION HEMATOMA ARM;  Surgeon: Rosetta Posner, MD;  Location: [facility] OR;  Service: Vascular;  Laterality: Left;   HOT HEMOSTASIS N/A 10/26/2017   Procedure: HOT HEMOSTASIS (ARGON PLASMA COAGULATION/BICAP);  Surgeon: Ladene Artist, MD;  Location: [facility];  Service: Endoscopy;  Laterality: N/A;   HOT HEMOSTASIS N/A 03/02/2020   Procedure: HOT HEMOSTASIS (ARGON PLASMA COAGULATION/BICAP);  Surgeon: Yetta Flock, MD;  Location: [facility] ENDOSCOPY;  Service: Gastroenterology;  Laterality: N/A;   HOT HEMOSTASIS N/A 05/31/2020   Procedure: HOT HEMOSTASIS (ARGON PLASMA COAGULATION/BICAP);  Surgeon: Irene Shipper, MD;  Location: [facility] ENDOSCOPY;  Service: Endoscopy;  Laterality: N/A;   INSERT / REPLACE / REMOVE PACEMAKER  ?2008   IR PERC TUN PERIT CATH WO PORT  S&I /IMAG  07/05/2020   PERIPHERAL VASCULAR INTERVENTION Left 03/28/2021   Procedure: PERIPHERAL VASCULAR INTERVENTION;  Surgeon: Waynetta Sandy, MD;  Location: [place] [facility];  Service: Cardiovascular;  Laterality: Left;   POLYPECTOMY  11/08/2017   Procedure: POLYPECTOMY;  Surgeon: Milus Banister, MD;  [facility];  Service: Endoscopy;;   PROCTOSCOPY N/A 05/22/2018   Procedure: RIGID PROCTOSCOPY;  Surgeon: Michael Boston, MD;  Location: [facility];  Service: General;  Laterality: N/A;   RIGHT HEART CATH N/A 02/13/2018   Procedure: RIGHT HEART CATH;  Surgeon: [name], MD;  Location: [place] [facility];  Service: Cardiovascular;  Laterality: N/A;   RIGHT HEART CATH N/A 06/23/2020   Procedure: RIGHT HEART CATH;  Surgeon: [name], MD;  Location: [place] [facility];  Service: Cardiovascular;  Laterality: N/A;   RIGHT/LEFT HEART CATH AND CORONARY ANGIOGRAPHY N/A 12/11/2019   Procedure: RIGHT/LEFT HEART CATH AND CORONARY ANGIOGRAPHY;  Surgeon: [name], MD;  Location: [place] [facility];  Service: Cardiovascular;  Laterality: N/A;   SUBMUCOSAL TATTOO INJECTION  03/02/2020   Procedure: SUBMUCOSAL TATTOO INJECTION;  Surgeon: Yetta Flock, MD;  [facility] ENDOSCOPY;  Service: Gastroenterology;;   SUBMUCOSAL TATTOO INJECTION  05/31/2020   Procedure: SUBMUCOSAL TATTOO INJECTION;  Surgeon: Irene Shipper, MD;  Location: [facility] END[facility]COPY;  Service: Endoscopy;;   TUBAL LIGATION     XI ROBOTIC ASSISTED LOWER ANTERIOR RESECTION N/A 05/22/2018   Procedure: XI ROBOTIC ASSISTED SIGMOID COLOECTOMY MOBILIZATION OF SPLENIC FLEXURE, FIREFLY ASSESSMENT OF PERFUSION;  Surgeon: Michael Boston, MD;  Location: [facility];  Service: General;  Laterality: N/A;  ERAS PATHWAY   Social History   Social History Narrative   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   Review of Systems As above  Objective:   Physical Exam BP 130/64    Pulse 62    Ht 4\' 11"  (1.499 m)    Wt 133 lb 6.4 oz (60.5 kg)    SpO2 97%    BMI 26.94 kg/m  Well-developed well-nourished black woman no acute  distress Lungs clear Heart sounds are normal S1-S2 I do not hear any rubs murmurs or gallops Abdomen is soft without organomegaly or mass

## 2021-05-31 NOTE — Telephone Encounter (Signed)
Pt being followed in the liver clinic   Ultrasound reminder removed  that was placed in November (it was for 6 months from then).

## 2021-05-31 NOTE — Patient Instructions (Signed)
I did not realize that Dawn was keeping a check on you in the liver clinic so seeing me for the liver is not necessary.I am still your gastroenterologist if needed, though.  I appreciate the opportunity to care for you. [name], MD, [facility]

## 2021-06-08 ENCOUNTER — Encounter: Payer: Self-pay | Admitting: Oncology

## 2021-06-09 DIAGNOSIS — G5603 Carpal tunnel syndrome, bilateral upper limbs: Secondary | ICD-10-CM

## 2021-06-09 DIAGNOSIS — G5620 Lesion of ulnar nerve, unspecified upper limb: Secondary | ICD-10-CM

## 2021-06-09 DIAGNOSIS — G63 Polyneuropathy in diseases classified elsewhere: Secondary | ICD-10-CM | POA: Insufficient documentation

## 2021-06-09 HISTORY — DX: Carpal tunnel syndrome, bilateral upper limbs: G56.03

## 2021-06-09 HISTORY — DX: Lesion of ulnar nerve, unspecified upper limb: G56.20

## 2021-06-09 HISTORY — DX: Polyneuropathy in diseases classified elsewhere: G63

## 2021-06-14 ENCOUNTER — Encounter (HOSPITAL_COMMUNITY): Payer: Self-pay | Admitting: Cardiology

## 2021-06-14 ENCOUNTER — Other Ambulatory Visit: Payer: Self-pay

## 2021-06-14 ENCOUNTER — Ambulatory Visit (HOSPITAL_COMMUNITY)
Admission: RE | Admit: 2021-06-14 | Discharge: 2021-06-14 | Disposition: A | Discharge status: Home / Self Care | Payer: Medicare Other | Source: Ambulatory Visit | Attending: Cardiology | Admitting: Cardiology

## 2021-06-14 VITALS — BP 110/58 | HR 64 | Wt 132.8 lb

## 2021-06-14 DIAGNOSIS — I5032 Chronic diastolic (congestive) heart failure: Secondary | ICD-10-CM | POA: Insufficient documentation

## 2021-06-14 DIAGNOSIS — K7460 Unspecified cirrhosis of liver: Secondary | ICD-10-CM | POA: Insufficient documentation

## 2021-06-14 DIAGNOSIS — I480 Paroxysmal atrial fibrillation: Secondary | ICD-10-CM | POA: Diagnosis not present

## 2021-06-14 DIAGNOSIS — K922 Gastrointestinal hemorrhage, unspecified: Secondary | ICD-10-CM | POA: Insufficient documentation

## 2021-06-14 DIAGNOSIS — N186 End stage renal disease: Secondary | ICD-10-CM | POA: Diagnosis not present

## 2021-06-14 DIAGNOSIS — I132 Hypertensive heart and chronic kidney disease with heart failure and with stage 5 chronic kidney disease, or end stage renal disease: Secondary | ICD-10-CM | POA: Insufficient documentation

## 2021-06-14 DIAGNOSIS — Z79899 Other long term (current) drug therapy: Secondary | ICD-10-CM | POA: Insufficient documentation

## 2021-06-14 DIAGNOSIS — D500 Iron deficiency anemia secondary to blood loss (chronic): Secondary | ICD-10-CM | POA: Diagnosis not present

## 2021-06-14 DIAGNOSIS — Z7901 Long term (current) use of anticoagulants: Secondary | ICD-10-CM | POA: Diagnosis not present

## 2021-06-14 DIAGNOSIS — E1122 Type 2 diabetes mellitus with diabetic chronic kidney disease: Secondary | ICD-10-CM | POA: Insufficient documentation

## 2021-06-14 DIAGNOSIS — I428 Other cardiomyopathies: Secondary | ICD-10-CM | POA: Insufficient documentation

## 2021-06-14 DIAGNOSIS — I484 Atypical atrial flutter: Secondary | ICD-10-CM

## 2021-06-14 DIAGNOSIS — Z992 Dependence on renal dialysis: Secondary | ICD-10-CM | POA: Insufficient documentation

## 2021-06-14 DIAGNOSIS — I4892 Unspecified atrial flutter: Secondary | ICD-10-CM | POA: Diagnosis not present

## 2021-06-14 NOTE — Progress Notes (Signed)
PCP: Dr. Colin Benton Cardiology: Dr. Caryl Comes HF Cardiology: Dr. [name]  76 y.o. with history of nonischemic cardiomyopathy with recovery of EF with BiV pacing, recurrent GI bleeding, and paroxysmal atrial flutter was referred by Dr. Caryl Comes for evaluation of CHF.  Patient has a history of presumed nonischemic cardiomyopathy for a number of years.  Echo in 12/15 showed EF 25-30% and Cardiolite at that time showed no ischemia.  She had Medtronic CRT-D system placed, and EF recovered. In 4/19, echo showed EF 60-65%.    She has additionally had problems with GI bleeding.  She had been on Eliquis for atrial flutter but this was stopped with recurrent GI bleeding.  She had bleeding from a gastric ulcer but was also noted to have multiple small bowel AVMs on capsule endoscopy out of reach of endoscope.  Eliquis was stopped. She has had no melena or BRBPR off Eliquis.   Echo in 10/19 showed EF 55-60%, mild-moderate MR, moderate AI, moderate RV dilation.   She was admitted in 1/20 with diverticulitis and ended up having a sigmoid colectomy.    Echo in 9/20 showed EF lower at 45-50%, moderate AI.   She had LHC/RHC in 8/21 with nonobstructive CAD, normal filling pressures, and preserved cardiac output. Echo 8/21 showed EF 50-55%, mild LVH, mildly decreased RV systolic function, moderate aortic insufficiency.  Back in in A fib the end of October. Amio was increased and DC-CV was set up for November 19th.  Chemically converted so DC-CV canceled.    Seen in HF Clinic in 10/21 and was more fatigued. Found to be in atrial flutter but hgb also 6.9. She was on Eliquis at the time. Loaded on amio and converted to NSR. Patient underwent small bowel enteroscopy 03/02/2020 with findings trace esophageal varix, small superficial AVMs prepyloric stomach with other punctate vascular lesions treated with APC, benign duodenal polyp.  Etiology likely secondary to jejunal AVM versus early GAVE vs portal gastropathy.Patient  underwent small bowel enteroscopy 03/02/2020 with findings trace esophageal varix, small superficial AVMs prepyloric stomach with other punctate vascular lesions treated with APC, benign duodenal polyp.  Etiology likely secondary to jejunal AVM versus early GAVE vs portal gastropathy. Eliquis restarted.    Hgb dropped back down to 7.7 in 12/21. Saw [name] and capsule placed but got stuck in the stomach.    Had volume overload on device in 05/24/20 and torsemide increased by Dr. [name]    She was admitted on 05/27/20 with dizziness, and a fall preceded by melena found to have further GI bleeding hgb of 5.1 and an AKI.  Eliquis was held and she was transfused 2u PRBC's.  Repeat pill endoscope showed bleeding in distal small bowel.  Colonoscopy found no bleeding but large avm in cecum argon lasered.  She has been off anticoagulants.   Admitted 06/23/20 from [place] with markedly elevated filling pressures. Aggressively diuresed but had worsening renal function and progressed to ESRD. Nephrology consulted. She tolerated HD well and is on MWF schedule.  Echo in 2/22 showed EF 55-60%, low normal RV systolic function.   Saw EP, turned down for Watchman due to comorbidities.   She returns for followup of CHF.  Weight is stable.  She is being evaluated in [place] for renal transplant.  She is feeling good overall.  No exertional dyspnea, can walk up stairs without problems.  No chest pain.  No lightheadedness.  No palpitations.    ECG (personally reviewed): Atypical atrial flutter, BiV pacing  Medtronic device interrogation: >  99% BiV pacing, stable thoracic impedance.   Labs (9/19):  Hgb 10.2, K 3.8, creatinine 2.5 Labs (02/26/2018): K 3.4, creatinine 2.2, WBC 76.  Labs (2/20): K 3.9, creatinine 1.8 Labs (8/20): K 3.8, creatinine 1.98 Labs (3/21): LDL 33 Labs (5/21): K 3.6, creatinine 1.69 Labs (7/21): K 4, creatinine 1.94 Labs (9/21): K 4.5, creatinine 2.24, TSH normal, HFTs normal  Labs (11/21): K  4.2 Creatinine 3.05  Labs (1/22): K 4.0, creatinine 2.22 Labs (2/22): K 4.1, creatinine 2.54 Labs (8/22): TSH 6.06 Labs (9/22): LFTs normal, hgb 10.5. s  PMH: 1. Diabetes: diet-controlled.  2. Gout 3. HCV: Treated with Harvoni.  4. Atrial flutter: Atypical.  Had DCCV in 3/19, maintained on amiodarone.  5. HTN 6. ESRD: MWF HD 7. H/o SBO 8. Obesity 9. Nonischemic cardiomyopathy: Echo in 12/15 with EF 25-30%.  Cardiolite in 12/15 with no ischemia.  She had a Medtronic CRT-D device placed with recovery of EF.   - Echo (4/19): EF 60-65% with moderate AI, moderate MR, and PASP 49 mmHg.  - RHC 02/13/2018 RA mean 6, RV 49/7,PA 50/18, mean 31,PCWP mean 20, Oxygen saturations:PA 67% AO 97%, Cardiac Output (Fick) 4.6, Cardiac Index (Fick) 2.9, PVR 2.4 WU - Echo (10/19): EF 55-60%, mild-moderate MR, moderate AI, moderate RV dilation, PASP 60 mmHg.  - PYP scan (3/20): H/CL 1.3, grade 1-2 visually.  Unlikely transthyretin cardiac amyloidosis.  - Echo (9/20): EF 45-50%, moderate aortic insufficiency.  - LHC/RHC (8/21): 40% pLAD, 50% D1; mean RA 3, PA 41/16, mean PCWP 14, CI 2.74, PVR 2.99.  - Echo (8/21): EF 50-55%, mild LVH, grade 2 diastolic dysfunction, normal RV size with mildly decreased systolic function, moderate AI, no AS - Echo 07/2020  EF 55-60%, moderate LVH, D-shaped interventricular septum, RV moderately dilated with low normal function, severe biatrial enlargement, severe TR.   - RHC 06/2020:  RA mean 21 RV 49/20 PA 59/24, mean 39 PCWP mean 26 Cardiac Output (Fick) 5.07 Cardiac Index (Fick) 3.23 PVR 2.6 WU Cardiac Output (Thermo) 4.72 Cardiac Index (Thermo) 3.01 PVR 2.75 WU PAPI 1.7.  10. GI bleeding: Gastric AVM and also gastric ulcer seen in past.  - 8/19 capsule endoscopy showed small bowel AVMs beyond the reach of endoscopy.  - With recurrent episodes of GI bleeding, Eliquis was stopped.  - Capsule study 1/22 showed bleeding in distal small bowel.  Colonoscopy found no bleeding but large  avm in cecum argon lasered.  11. Fe deficiency anemia.  12. Depression. 13. OSA: Uses CPAP.  14. Diverticulitis: Sigmoid colectomy in 1/20.   ROS: All systems reviewed and negative except as per HPI.   Social History   Socioeconomic History   Marital status: Divorced    Spouse name: Not on file   Number of children: 1   Years of education: 12   Highest education level: Not on file  Occupational History   Not on file  Tobacco Use   Smoking status: Never   Smokeless tobacco: Never  Vaping Use   Vaping Use: Never used  Substance and Sexual Activity   Alcohol use: Not Currently    Comment: 01/03/2018 "couple drinks/month"   Drug use: Not Currently    Types: Marijuana    Comment: VERY INTERMITTENT    Sexual activity: Not Currently  Other Topics Concern   Not on file  Social History Narrative   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      Home Situation: lives alone      Spiritual Beliefs:  Christian - Baptist      Lifestyle: no regular exercise; poor diet      She is divorced at least one adult child   Never smoker    rare alcohol to occasional at best    no substance abuse         Social Determinants of Radio broadcast assistant Strain: Low Risk    Difficulty of Paying Living Expenses: Not hard at all  Food Insecurity: No Food Insecurity   Worried About Charity fundraiser in the Last Year: Never true   [place] in the Last Year: Never true  Transportation Needs: No Transportation Needs   Lack of Transportation (Medical): No   Lack of Transportation (Non-Medical): No  Physical Activity: Not on file  Stress: Not on file  Social Connections: Not on file  Intimate Partner Violence: Not on file   Family History  Problem Relation Age of Onset   Asthma Father    Heart attack Father    Asthma Sister    Lung cancer Sister    Heart attack Mother    Stroke Brother    Colon cancer Neg Hx    Esophageal cancer Neg Hx    Pancreatic cancer Neg Hx     Stomach cancer Neg Hx    Liver disease Neg Hx     Current Outpatient Medications  Medication Sig Dispense Refill   amiodarone (PACERONE) 200 MG tablet Take 1 tablet (200 mg total) by mouth daily. 90 tablet 3   carvedilol (COREG) 3.125 MG tablet TAKE 1 TABLET BY MOUTH TWICE DAILY WITH A MEAL 180 tablet 3   gabapentin (NEURONTIN) 300 MG capsule Take 1 capsule (300 mg total) by mouth daily. 180 capsule 2   isosorbide-hydrALAZINE (BIDIL) 20-37.5 MG tablet Take 0.5 tablets by mouth 3 (three) times daily. 126 tablet 5   levothyroxine (SYNTHROID) 137 MCG tablet Take 1 tablet (137 mcg total) by mouth daily before breakfast. 90 tablet 3   lidocaine-prilocaine (EMLA) cream 1 application once.     traZODone (DESYREL) 50 MG tablet Take 0.5-1 tablets (25-50 mg total) by mouth at bedtime as needed for sleep. 30 tablet 3   No current facility-administered medications for this encounter.   BP (!) 110/58    Pulse 64    Wt 60.2 kg (132 lb 12.8 oz)    SpO2 99%    BMI 26.82 kg/m   Wt Readings from Last 3 Encounters:  06/14/21 60.2 kg (132 lb 12.8 oz)  05/31/21 60.5 kg (133 lb 6.4 oz)  03/28/21 59 kg (130 lb)   PHYSICAL EXAM:  General: NAD Neck: No JVD, no thyromegaly or thyroid nodule.  Lungs: Clear to auscultation bilaterally with normal respiratory effort. CV: Nondisplaced PMI.  Heart regular S1/S2, no S3/S4, no murmur.  No peripheral edema.  No carotid bruit.  Normal pedal pulses.  Abdomen: Soft, nontender, no hepatosplenomegaly, no distention.  Skin: Intact without lesions or rashes.  Neurologic: Alert and oriented x 3.  Psych: Normal affect. Extremities: No clubbing or cyanosis.  HEENT: Normal.   Assessment/Plan: 1. NICM, recovered, now with primarily diastolic CHF:  Echo in 7/85 showed EF improved to 60-65% with MDT BiV pacing. Echo in 10/19 showed stable EF 55-60% with mild-moderate MR, moderate AI. PYP scan in 3/20 was equivocal for transthyretin amyloidosis.  Echo in 9/20 showed EF back down  to 45-50%. LHC/RHC in 8/21 showed nonobstructive CAD, normal filling pressures and cardiac output. Last echo 06/2020 EF was  55-60% a with grade II DD and RV low normal. Volume now managed by HD, NYHA class II symptoms and not volume overloaded on exam or by Optivol.  - HD for volume management.   - Continue Bidil 1/2  tab tid.  - Continue Coreg  3.125 mg twice a day.   - I will arrange for repeat echo.  2. Atrial fibrillation/flutter: Paroxysmal.  She is not on anticoagulation due to multiple GI bleeds, and she was deemed a poor Watchman candidate due to comorbidities by Dr. Quentin Ore.  Currently no evidence for overt bleeding.  She is in atypical atrial flutter today, denies palpitations.  - Continue amiodarone, check LFTs and TSH.  She needs regular eye exam.  - I will have her come back in 1-2 weeks for repeat ECG to see if she remains in atrial flutter. If she does, will need to consider starting at least short-term Eliquis and then cardioverting her.  3. ESRD: Now on HD M-W-F - She is being worked up in [place] for renal transplant.  4. Anemia, chronic blood loss: Long history multiple bleeding events while on Eliquis and around 3 endoscopy/colonoscopies over the last 3 years. Sigmoid colectomy for recurrent diverticulitis. Eliquis has been stopped and started back in the past.  Cecal avm ablated most recently.  - As above, will leave off Eliquis for now.  5. Cirrhosis: S/p treatment with Harvoni for Hep C, followed by GI outpatient.      Followup 6 months with APP but return for ECG in 1-2 weeks.    Loralie Champagne 06/14/2021

## 2021-06-14 NOTE — Patient Instructions (Signed)
Thank you for your visit today.  Your physician has requested that you have an echocardiogram. Echocardiography is a painless test that uses sound waves to create images of your heart. It provides your doctor with information about the size and shape of your heart and how well your hearts chambers and valves are working. This procedure takes approximately one hour. There are no restrictions for this procedure.  Your physician recommends that you schedule a follow-up appointment in: 6 months (August 2023) **please call the office in June to make your appointment**  If you have any questions or concerns before your next appointment please send [name] a message through [place] or call our office at [phone].    TO LEAVE A MESSAGE FOR THE NURSE SELECT OPTION 2, PLEASE LEAVE A MESSAGE INCLUDING: YOUR NAME DATE OF BIRTH CALL BACK NUMBER REASON FOR CALL**this is important as we prioritize the call backs  YOU WILL RECEIVE A CALL BACK THE SAME DAY AS LONG AS YOU CALL BEFORE 4:00 PM  At the [facility], you and your health needs are our priority. As part of our continuing mission to provide you with exceptional heart care, we have created designated Provider Care Teams. These Care Teams include your primary Cardiologist (physician) and Advanced Practice Providers (APPs- Physician Assistants and Nurse Practitioners) who all work together to provide you with the care you need, when you need it.   You may see any of the following providers on your designated Care Team at your next follow up: Dr Glori Bickers Dr Haynes Kerns, NP Lyda Jester, [place] [facility] [place], [place] Audry Riles, PharmD   Please be sure to bring in all your medications bottles to every appointment.

## 2021-06-23 ENCOUNTER — Other Ambulatory Visit (HOSPITAL_COMMUNITY): Payer: Commercial Managed Care - HMO

## 2021-06-23 ENCOUNTER — Encounter: Payer: Self-pay | Admitting: Oncology

## 2021-06-27 ENCOUNTER — Encounter (HOSPITAL_COMMUNITY): Payer: Commercial Managed Care - HMO

## 2021-06-28 ENCOUNTER — Ambulatory Visit (HOSPITAL_COMMUNITY)
Admission: RE | Admit: 2021-06-28 | Discharge: 2021-06-28 | Disposition: A | Discharge status: Home / Self Care | Payer: Medicare Other | Source: Ambulatory Visit | Attending: Cardiology | Admitting: Cardiology

## 2021-06-28 ENCOUNTER — Other Ambulatory Visit: Payer: Self-pay | Admitting: Medical

## 2021-06-28 ENCOUNTER — Other Ambulatory Visit: Payer: Self-pay

## 2021-06-28 DIAGNOSIS — I110 Hypertensive heart disease with heart failure: Secondary | ICD-10-CM | POA: Diagnosis present

## 2021-06-28 DIAGNOSIS — E119 Type 2 diabetes mellitus without complications: Secondary | ICD-10-CM | POA: Diagnosis not present

## 2021-06-28 DIAGNOSIS — I471 Supraventricular tachycardia: Secondary | ICD-10-CM | POA: Diagnosis not present

## 2021-06-28 DIAGNOSIS — E785 Hyperlipidemia, unspecified: Secondary | ICD-10-CM | POA: Insufficient documentation

## 2021-06-28 DIAGNOSIS — I083 Combined rheumatic disorders of mitral, aortic and tricuspid valves: Secondary | ICD-10-CM | POA: Diagnosis not present

## 2021-06-28 DIAGNOSIS — Z95810 Presence of automatic (implantable) cardiac defibrillator: Secondary | ICD-10-CM | POA: Diagnosis not present

## 2021-06-28 DIAGNOSIS — I5032 Chronic diastolic (congestive) heart failure: Secondary | ICD-10-CM

## 2021-06-28 LAB — ECHOCARDIOGRAM COMPLETE
Area-P 1/2: 4.07 cm2
MV M vel: 4.1 m/s
MV Peak grad: 67.2 mmHg
P 1/2 time: 373 msec
S' Lateral: 3.3 cm

## 2021-07-01 ENCOUNTER — Telehealth (HOSPITAL_COMMUNITY): Payer: Self-pay

## 2021-07-01 NOTE — Telephone Encounter (Addendum)
Pt aware, agreeable, and verbalized understanding F/U arranged with [name]  ----- Message from [name], MD sent at 06/30/2021 12:09 AM EST ----- Possible severe aortic insufficiency. She will need office visit to discuss TEE, may need TAVR.

## 2021-07-12 ENCOUNTER — Ambulatory Visit: Payer: Medicare Other

## 2021-07-13 ENCOUNTER — Encounter: Payer: Self-pay | Admitting: Oncology

## 2021-07-15 ENCOUNTER — Ambulatory Visit (INDEPENDENT_AMBULATORY_CARE_PROVIDER_SITE_OTHER): Payer: Medicare Other

## 2021-07-15 ENCOUNTER — Ambulatory Visit: Payer: Medicare Other | Admitting: Neurology

## 2021-07-15 DIAGNOSIS — Z0000 Encounter for general adult medical examination without abnormal findings: Secondary | ICD-10-CM

## 2021-07-15 NOTE — Progress Notes (Addendum)
Subjective:   Morgan Clay is a 76 y.o. female who presents for Medicare Annual (Subsequent) preventive examination.  I connected with  Jeri Lager on 07/15/21 by a audio enabled telemedicine application and verified that I am speaking with the correct person using two identifiers.  Patient Location: Home  Provider Location: Office/Clinic  I discussed the limitations of evaluation and management by telemedicine. The patient expressed understanding and agreed to proceed.   Review of Systems     Cardiac Risk Factors include: hypertension;advanced age (>47mn, >>90women)     Objective:    There were no vitals filed for this visit. There is no height or weight on file to calculate BMI.  Advanced Directives 07/15/2021 03/28/2021 09/24/2020 09/16/2020 08/05/2020 06/23/2020 05/28/2020  Does Patient Have a Medical Advance Directive? Yes No No No No No No  Type of [place]of [place];Out of facility DNR (pink MOST or yellow form) - - - - - -  Does patient want to make changes to medical advance directive? - - - - - - -  Copy of [place] Chart? Yes - validated most recent copy scanned in chart (See row information) - - - - - -  Would patient like information on creating a medical advance directive? - No - Patient declined No - Patient declined - No - Patient declined No - Patient declined No - Patient declined    Current Medications (verified) Outpatient Encounter Medications as of 07/15/2021  Medication Sig   amiodarone (PACERONE) 200 MG tablet Take 1 tablet (200 mg total) by mouth daily.   carvedilol (COREG) 3.125 MG tablet TAKE 1 TABLET BY MOUTH TWICE DAILY WITH A MEAL   gabapentin (NEURONTIN) 300 MG capsule Take 1 capsule (300 mg total) by mouth daily.   isosorbide-hydrALAZINE (BIDIL) 20-37.5 MG tablet Take 0.5 tablets by mouth 3 (three) times daily.   levothyroxine (SYNTHROID) 137 MCG tablet Take 1 tablet (137 mcg  total) by mouth daily before breakfast.   lidocaine-prilocaine (EMLA) cream 1 application once.   traZODone (DESYREL) 50 MG tablet Take 0.5-1 tablets (25-50 mg total) by mouth at bedtime as needed for sleep.   No facility-administered encounter medications on file as of 07/15/2021.    Allergies (verified) Patient has no known allergies.   History: Past Medical History:  Diagnosis Date   A-fib ([place]    on Eliquis   AICD (automatic cardioverter/defibrillator) present 10/06/2015   Anemia    Angiodysplasia of small intestine 06/10/2020   Ileum - seen on capsule endoscopy 05/2020 - ablated at colonoscopy   ARF (acute renal failure) ([place]    Arthritis    "qwhere" (01/03/2018)   Asthma    reports mild asthma since childhood - had COPD on dx list from prior PCP   Chronic bronchitis ([place]    "get it most years; not this past year though" (01/03/2018)   Chronic kidney disease    "? stage;  [facility] Dr. DLorrene Reid (01/03/2018)   Chronic systolic congestive heart failure ([place]    Cirrhosis (HD[place] 09/14/2020   dx by Dr CSilvano Rusk   COVID-19 05/03/2020   DEPRESSION 12/17/2009   Annotation: PHQ-9 score = 14 done on 12/17/2009 Qualifier: Diagnosis of  By: MHassell DoneFNP, [place]     Diabetes mellitus without complication (HE[place]    DIET CONTROLLED    Diverticulosis    Enteritis    FIBROIDS, UTERUS 03/05/2008   GI bleeding 01/03/2018   Glaucoma  Gout    Hepatitis C    HEPATITIS C - s/p treatment with Harvoni, saw hepatology, Dawn Drazek   History of blood transfusion ~ 11/2017   Hyperlipemia 12/06/2012   HYPERTENSION, BENIGN 04/24/2007   Hypothyroidism    LBBB (left bundle branch block)    Lower GI bleeding    "been dealing w/it since 07/2017" (01/03/2018)   Nonischemic cardiomyopathy ([place])    OBESITY 05/27/2009   OBSTRUCTIVE SLEEP APNEA 11/14/2007   no CPAP   OSTEOPENIA 09/30/2008   Pain and swelling of left upper extremity 08/08/2017   Personal history of other infectious and  parasitic disease    Hepatitis B   Pneumonia    "several times" (01/03/2018)   Pulmonary edema, acute ([place]) 08/09/2017   Past Surgical History:  Procedure Laterality Date   A/V FISTULAGRAM N/A 03/28/2021   Procedure: A/V FISTULAGRAM;  Surgeon: Waynetta Sandy, MD;  Location: [place] [facility] LAB;  Service: Cardiovascular;  Laterality: N/A;   APPENDECTOMY     AV FISTULA PLACEMENT Left 08/05/2020   Procedure: LEFT UPPER EXTREMITY ARTERIOVENOUS (AV) FISTULA CREATION;  Surgeon: Cherre Robins, MD;  Location: [place];  Service: Vascular;  Laterality: Left;   [place] Left 09/16/2020   Procedure: LEFT SECOND STAGE [place];  Surgeon: Cherre Robins, MD;  Location: [place];  Service: Vascular;  Laterality: Left;  PERIPHERAL NERVE BLOCK   CARDIAC CATHETERIZATION     CARDIOVERSION N/A 12/14/2014   Procedure: CARDIOVERSION;  Surgeon: Dorothy Spark, MD;  Location: [place];  Service: Cardiovascular;  Laterality: N/A;   CARDIOVERSION N/A 02/26/2017   Procedure: CARDIOVERSION;  Surgeon: Evans Lance, MD;  Location: [place] [facility];  Service: Cardiovascular;  Laterality: N/A;   CARDIOVERSION N/A 07/24/2017   Procedure: CARDIOVERSION;  Surgeon: Acie Fredrickson Wonda Cheng, MD;  Location: [facility] ENDOSCOPY;  Service: Cardiovascular;  Laterality: N/A;   CARDIOVERSION N/A 02/12/2020   Procedure: CARDIOVERSION;  Surgeon: [name], MD;  Location: [facility] ENDOSCOPY;  Service: Cardiovascular;  Laterality: N/A;   COLONOSCOPY N/A 11/08/2017   Procedure: COLONOSCOPY;  Surgeon: Milus Banister, MD;  [facility];  Service: Endoscopy;  Laterality: N/A;   COLONOSCOPY WITH PROPOFOL N/A 05/31/2020   Procedure: COLONOSCOPY WITH PROPOFOL;  Surgeon: Irene Shipper, MD;  Location: [facility] ENDOSCOPY;  Service: Endoscopy;  Laterality: N/A;   DILATION AND CURETTAGE OF UTERUS     ENTEROSCOPY N/A 03/02/2020   Procedure: ENTEROSCOPY;  Surgeon: Yetta Flock, MD;  Location: [facility]  ENDOSCOPY;  Service: Gastroenterology;  Laterality: N/A;   EP IMPLANTABLE DEVICE N/A 10/06/2015   Procedure: BIV ICD Generator Changeout;  Surgeon: [name], MD;  Location: [place] CV LAB;  Service: Cardiovascular;  Laterality: N/A;   ESOPHAGOGASTRODUODENOSCOPY N/A 10/26/2017   Procedure: ESOPHAGOGASTRODUODENOSCOPY (EGD);  Surgeon: Ladene Artist, MD;  Location[facility];  Service: Endoscopy;  Laterality: N/A;   ESOPHAGOGASTRODUODENOSCOPY (EGD) WITH PROPOFOL N/A 11/07/2017   Procedure: ESOPHAGOGASTRODUODENOSCOPY (EGD) WITH PROPOFOL;  Surgeon: Milus Banister, MD;  [facility];  Service: Endoscopy;  Laterality: N/A;   ESOPHAGOGASTRODUODENOSCOPY (EGD) WITH PROPOFOL N/A 05/29/2020   Procedure: ESOPHAGOGASTRODUODENOSCOPY (EGD) WITH PROPOFOL;  Surgeon: Doran Stabler, MD;  Location: [place];  Service: Gastroenterology;  Laterality: N/A;   GIVENS CAPSULE STUDY N/A 05/19/2020   Procedure: GIVENS CAPSULE STUDY;  Surgeon: [name], MD;  Location: [place];  Service: Endoscopy;  Laterality: N/A;  .adm for obs since pacemaker, PA wil enter order and see  pt   GIVENS CAPSULE STUDY N/A 05/29/2020   Procedure: GIVENS CAPSULE STUDY;  Surgeon: Doran Stabler, MD;  Location: [place];  Service: Gastroenterology;  Laterality: N/A;   HEMATOMA EVACUATION Left 09/24/2020   Procedure: EVACUATION HEMATOMA ARM;  Surgeon: Rosetta Posner, MD;  Location: [facility] OR;  Service: Vascular;  Laterality: Left;   HOT HEMOSTASIS N/A 10/26/2017   Procedure: HOT HEMOSTASIS (ARGON PLASMA COAGULATION/BICAP);  Surgeon: Ladene Artist, MD;  Location: [facility];  Service: Endoscopy;  Laterality: N/A;   HOT HEMOSTASIS N/A 03/02/2020   Procedure: HOT HEMOSTASIS (ARGON PLASMA COAGULATION/BICAP);  Surgeon: Yetta Flock, MD;  Location[facility] ENDOSCOPY;  Service: Gastroenterology;  Laterality: N/A;   HOT HEMOSTASIS N/A 05/31/2020   Procedure: HOT HEMOSTASIS (ARGON PLASMA COAGULATION/BICAP);   Surgeon: Irene Shipper, MD;  Location: [facility] ENDOSCOPY;  Service: Endoscopy;  Laterality: N/A;   INSERT / REPLACE / REMOVE PACEMAKER  ?2008   IR PERC TUN PERIT CATH WO PORT S&I /IMAG  07/05/2020   PERIPHERAL VASCULAR INTERVENTION Left 03/28/2021   Procedure: PERIPHERAL VASCULAR INTERVENTION;  Surgeon: Waynetta Sandy, MD;  Location: [place] CV LAB;  Service: Cardiovascular;  Laterality: Left;   POLYPECTOMY  11/08/2017   Procedure: POLYPECTOMY;  Surgeon: Milus Banister, MD;  [facility];  Service: Endoscopy;;   PROCTOSCOPY N/A 05/22/2018   Procedure: RIGID PROCTOSCOPY;  Surgeon: Michael [place], MD;  [facility];  Service: General;  Laterality: N/A;   RIGHT HEART CATH N/A 02/13/2018   Procedure: RIGHT HEART CATH;  Surgeon: [name], MD;  Location: [place] CV LAB;  Service: Cardiovascular;  Laterality: N/A;   RIGHT HEART CATH N/A 06/23/2020   Procedure: RIGHT HEART CATH;  Surgeon: [name], MD;  Location: [place] CV LAB;  Service: Cardiovascular;  Laterality: N/A;   RIGHT/LEFT HEART CATH AND CORONARY ANGIOGRAPHY N/A 12/11/2019   Procedure: RIGHT/LEFT HEART CATH AND CORONARY ANGIOGRAPHY;  Surgeon: [name], MD;  Location: [place] [facility];  Service: Cardiovascular;  Laterality: N/A;   SUBMUCOSAL TATTOO INJECTION  03/02/2020   Procedure: SUBMUCOSAL TATTOO INJECTION;  Surgeon: Yetta Flock, MD;  Location: [facility] ENDOSCOPY;  Service: Gastroenterology;;   SUBMUCOSAL TATTOO INJECTION  05/31/2020   Procedure: SUBMUCOSAL TATTOO INJECTION;  Surgeon: Irene Shipper, MD;  Location: [facility] ENDOSCOPY;  Service: Endoscopy;;   TUBAL LIGATION     XI ROBOTIC ASSISTED LOWER ANTERIOR RESECTION N/A 05/22/2018   Procedure: XI ROBOTIC ASSISTED SIGMOID COLOECTOMY MOBILIZATION OF SPLENIC FLEXURE, FIREFLY ASSESSMENT OF PERFUSION;  Surgeon: Michael Boston, MD;  [facility];  Service: General;  Laterality: N/A;  ERAS PATHWAY   Family History  Problem Relation Age of  Onset   Asthma Father    Heart attack Father    Asthma Sister    Lung cancer Sister    Heart attack Mother    Stroke Brother    Colon cancer Neg Hx    Esophageal cancer Neg Hx    Pancreatic cancer Neg Hx    Stomach cancer Neg Hx    Liver disease Neg Hx    Social History   Socioeconomic History   Marital status: Divorced    Spouse name: Not on file   Number of children: 1   Years of education: 12   Highest education level: Not on file  Occupational History   Not on file  Tobacco Use   Smoking status: Never   Smokeless tobacco: Never  Vaping Use   Vaping Use:  Never used  Substance and Sexual Activity   Alcohol use: Not Currently    Comment: 01/03/2018 "couple drinks/month"   Drug use: Not Currently    Types: Marijuana    Comment: VERY INTERMITTENT    Sexual activity: Not Currently  Other Topics Concern   Not on file  Social History Narrative   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      Home Situation: lives alone      Spiritual Beliefs: Christian - [employer]      Lifestyle: no regular exercise; poor diet      She is divorced at least one adult child   Never smoker    rare alcohol to occasional at best    no substance abuse         Social Determinants of Radio broadcast assistant Strain: Low Risk    Difficulty of Paying Living Expenses: Not hard at all  Food Insecurity: No Food Insecurity   Worried About Charity fundraiser in the Last Year: Never true   [place] in the Last Year: Never true  Transportation Needs: No Transportation Needs   Lack of Transportation (Medical): No   Lack of Transportation (Non-Medical): No  Physical Activity: Not on file  Stress: No Stress Concern Present   Feeling of Stress : Not at all  Social Connections: Moderately Isolated   Frequency of Communication with Friends and Family: More than three times a week   Frequency of Social Gatherings with Friends and Family: Three times a week   Attends Religious  Services: More than 4 times per year   Active Member of Clubs or Organizations: No   Attends Archivist Meetings: Never   Marital Status: Divorced    Tobacco Counseling Counseling given: Not Answered   Clinical Intake:  Pre-visit preparation completed: Yes  Pain : No/denies pain     Nutritional Risks: None  How often do you need to have someone help you when you read instructions, pamphlets, or other written materials from your doctor or pharmacy?: 1 - Never  Diabetic?Yes Nutrition Risk Assessment:  Has the patient had any N/V/D within the last 2 months?  No  Does the patient have any non-healing wounds?  No  Has the patient had any unintentional weight loss or weight gain?  No   Diabetes:  Is the patient diabetic?  No  If diabetic, was a CBG obtained today?  No  Did the patient bring in their glucometer from home?  No  How often do you monitor your CBG's? N/A.   Financial Strains and Diabetes Management:  Are you having any financial strains with the device, your supplies or your medication? No .  Does the patient want to be seen by Chronic Care Management for management of their diabetes?  No  Would the patient like to be referred to a Nutritionist or for Diabetic Management?  No     Interpreter Needed?: No  Information entered by :: [place] of Daily Living In your present state of health, do you have any difficulty performing the following activities: 07/15/2021 09/16/2020  Hearing? N -  Vision? N -  Difficulty concentrating or making decisions? N -  Walking or climbing stairs? N -  Dressing or bathing? N -  Doing errands, shopping? N N  Preparing Food and eating ? N -  Using the Toilet? N -  In the past six months, have you accidently leaked urine? N -  Do you have problems with loss of bowel control? N -  Managing your Medications? N -  Managing your Finances? N -  Housekeeping or managing your Housekeeping? N -  Some  recent data might be hidden    Patient Care Team: Saguier, [name] as PCP - General (Internal Medicine) [name], MD as Consulting Physician (Nephrology) [name], MD as Consulting Physician (Cardiology) [name], MD as Consulting Physician (Gastroenterology) [facility] [place] any recent Medical Services you may have received from other than [facility] providers in the past year (date may be approximate).     Assessment:   This is a routine wellness examination for [place].  Hearing/Vision screen No results found.  Dietary issues and exercise activities discussed: Current Exercise Habits: The patient does not participate in regular exercise at present, Exercise limited by: None identified   Goals Addressed             This Visit's Progress    Patient Stated   On track    Feel better this year        Depression Screen PHQ 2/9 Scores 07/15/2021 07/05/2020 10/15/2019 03/24/2019 09/02/2018 10/09/2017 06/26/2017  PHQ - 2 Score 0 5 0 6 0 0 -  PHQ- 9 Score - 14 0 7 0 4 -  Exception Documentation - - - - - - Patient refusal    Fall Risk Fall Risk  07/15/2021 10/19/2020 10/15/2019 06/26/2017 02/25/2016  Falls in the past year? 0 0 0 No No  Number falls in past yr: 0 0 0 - -  Injury with Fall? 0 0 0 - -  Risk for fall due to : No Fall Risks - No Fall Risks - -  Follow up Falls evaluation completed - Falls evaluation completed - -    FALL RISK PREVENTION PERTAINING TO THE HOME:  Any stairs in or around the home? Yes  If so, are there any without handrails? Yes  Home free of loose throw rugs in walkways, pet beds, electrical cords, etc? Yes  Adequate lighting in your home to reduce risk of falls? Yes   ASSISTIVE DEVICES UTILIZED TO PREVENT FALLS:  Life alert? No  Use of a cane, walker or w/c? No  Grab bars in the bathroom? No  Shower chair or bench in shower? Yes  Elevated toilet seat or a handicapped toilet? No   TIMED UP AND  GO:  Was the test performed? No .    Cognitive Function: MMSE - Mini Mental State Exam 06/26/2017  Not completed: (No Data)     6CIT Screen 07/15/2021  What Year? 0 points  What month? 0 points  What time? 0 points  Count back from 20 0 points  Months in reverse 0 points  Repeat phrase 4 points  Total Score 4    Immunizations Immunization History  Administered Date(s) Administered   Influenza Whole 04/23/2008, 04/07/2010   Influenza, High Dose Seasonal PF 02/25/2016, 06/07/2017, 02/07/2021   Influenza,inj,Quad PF,6+ Mos 01/08/2013, 02/24/2014   PFIZER Comirnaty(Gray Top)Covid-19 Tri-Sucrose Vaccine 07/10/2019, 08/05/2019, 04/08/2020   PFIZER(Purple Top)SARS-COV-2 Vaccination 07/10/2019, 08/05/2019, 04/08/2020   Pneumococcal Conjugate-13 08/25/2013   Pneumococcal Polysaccharide-23 08/26/2014   Td 05/08/2006, 05/30/2016   Zoster, Live 01/08/2013    TDAP status: Up to date  Flu Vaccine status: Up to date  Pneumococcal vaccine status: Up to date  Covid-19 vaccine status: Declined, Education has been provided regarding the importance of this vaccine but patient still declined. Advised may receive this  vaccine at local pharmacy or Health Dept.or vaccine clinic. Aware to provide a copy of the vaccination record if obtained from local pharmacy or Health Dept. Verbalized acceptance and understanding...  Qualifies for Shingles Vaccine? Yes   Zostavax completed No   Shingrix Completed?: No.    Education has been provided regarding the importance of this vaccine. Patient has been advised to call insurance company to determine out of pocket expense if they have not yet received this vaccine. Advised may also receive vaccine at local pharmacy or Health Dept. Verbalized acceptance and understanding.  Screening Tests Health Maintenance  Topic Date Due   Zoster Vaccines- Shingrix (1 of 2) Never done   FOOT EXAM  06/07/2018   OPHTHALMOLOGY EXAM  07/07/2019   COVID-19 Vaccine (6 -  Booster) 06/03/2020   HEMOGLOBIN A1C  04/20/2021   MAMMOGRAM  01/22/2022   TETANUS/TDAP  05/30/2026   COLONOSCOPY (Pts 45-55yr Insurance coverage will need to be confirmed)  05/31/2030   Pneumonia Vaccine 76 Years old  Completed   INFLUENZA VACCINE  Completed   DEXA SCAN  Completed   Hepatitis C Screening  Completed   HPV VACCINES  Aged Out    Health Maintenance  Health Maintenance Due  Topic Date Due   Zoster Vaccines- Shingrix (1 of 2) Never done   FOOT EXAM  06/07/2018   OPHTHALMOLOGY EXAM  07/07/2019   COVID-19 Vaccine (6 - Booster) 06/03/2020   HEMOGLOBIN A1C  04/20/2021    Colorectal cancer screening: Type of screening: Colonoscopy. Completed 05/31/20. Repeat every 05/31/2030 years  Mammogram status: scheduled for 08/02/2021  Bone Density status: declined referral  Lung Cancer Screening: (Low Dose CT Chest recommended if Age 74-80years, 30 pack-year currently smoking OR have quit w/in 15years.) does not qualify.   Lung Cancer Screening Referral: N/A  Additional Screening:  Hepatitis C Screening: does qualify; Completed 05/31/2021  Vision Screening: Recommended annual ophthalmology exams for early detection of glaucoma and other disorders of the eye. Is the patient up to date with their annual eye exam?  No  Who is the provider or what is the name of the office in which the patient attends annual eye exams? [facility] If pt is not established with a provider, would they like to be referred to a provider to establish care? No .   Dental Screening: Recommended annual dental exams for proper oral hygiene  Community Resource Referral / Chronic Care Management: CRR required this visit?  No   CCM required this visit?  No      Plan:     I have personally reviewed and noted the following in the patients chart:   Medical and social history Use of alcohol, tobacco or illicit drugs  Current medications and supplements including opioid prescriptions.   Functional ability and status Nutritional status Physical activity Advanced directives List of other physicians Hospitalizations, surgeries, and ER visits in previous 12 months Vitals Screenings to include cognitive, depression, and falls Referrals and appointments  In addition, I have reviewed and discussed with patient certain preventive protocols, quality metrics, and best practice recommendations. A written personalized care plan for preventive services as well as general preventive health recommendations were provided to patient.   Due to this being a telephonic visit, the after visit summary with patients personalized plan was offered to patient via mail or my-chart. Patient declined at this time.   SDuard BradyChism, [place]  07/15/2021   Nurse Notes: None  Review and Agree with assessment &  plan of CMA  Mackie Pai, PA-C

## 2021-07-15 NOTE — Patient Instructions (Signed)
Morgan Clay , Thank you for taking time to come for your Medicare Wellness Visit. I appreciate your ongoing commitment to your health goals. Please review the following plan we discussed and let me know if I can assist you in the future.   Screening recommendations/referrals: Colonoscopy: 05/31/20 due 05/31/2030 Mammogram: scheduled 08/02/21 Bone Density: declined referral Recommended yearly ophthalmology/optometry visit for glaucoma screening and checkup Recommended yearly dental visit for hygiene and checkup  Vaccinations: Influenza vaccine: up to date Pneumococcal vaccine: up to date Tdap vaccine: up to date Shingles vaccine: Due-May obtain vaccine at your local pharmacy.    Covid-19:Due-May obtain vaccine at your local pharmacy.   Advanced directives: Yes, copy on file  Conditions/risks identified: see problem list   Next appointment: Follow up in one year for your annual wellness visit    Preventive Care 65 Years and Older, Female Preventive care refers to lifestyle choices and visits with your health care provider that can promote health and wellness. What does preventive care include? A yearly physical exam. This is also called an annual well check. Dental exams once or twice a year. Routine eye exams. Ask your health care provider how often you should have your eyes checked. Personal lifestyle choices, including: Daily care of your teeth and gums. Regular physical activity. Eating a healthy diet. Avoiding tobacco and drug use. Limiting alcohol use. Practicing safe sex. Taking low-dose aspirin every day. Taking vitamin and mineral supplements as recommended by your health care provider. What happens during an annual well check? The services and screenings done by your health care provider during your annual well check will depend on your age, overall health, lifestyle risk factors, and family history of disease. Counseling  Your health care provider may ask you questions  about your: Alcohol use. Tobacco use. Drug use. Emotional well-being. Home and relationship well-being. Sexual activity. Eating habits. History of falls. Memory and ability to understand (cognition). Work and work Statistician. Reproductive health. Screening  You may have the following tests or measurements: Height, weight, and BMI. Blood pressure. Lipid and cholesterol levels. These may be checked every 5 years, or more frequently if you are over 26 years old. Skin check. Lung cancer screening. You may have this screening every year starting at age 83 if you have a 30-pack-year history of smoking and currently smoke or have quit within the past 15 years. Fecal occult blood test (FOBT) of the stool. You may have this test every year starting at age 31. Flexible sigmoidoscopy or colonoscopy. You may have a sigmoidoscopy every 5 years or a colonoscopy every 10 years starting at age 75. Hepatitis C blood test. Hepatitis B blood test. Sexually transmitted disease (STD) testing. Diabetes screening. This is done by checking your blood sugar (glucose) after you have not eaten for a while (fasting). You may have this done every 1-3 years. Bone density scan. This is done to screen for osteoporosis. You may have this done starting at age 30. Mammogram. This may be done every 1-2 years. Talk to your health care provider about how often you should have regular mammograms. Talk with your health care provider about your test results, treatment options, and if necessary, the need for more tests. Vaccines  Your health care provider may recommend certain vaccines, such as: Influenza vaccine. This is recommended every year. Tetanus, diphtheria, and acellular pertussis (Tdap, Td) vaccine. You may need a Td booster every 10 years. Zoster vaccine. You may need this after age 36. Pneumococcal 13-valent conjugate (PCV13) vaccine.  One dose is recommended after age 79. Pneumococcal polysaccharide (PPSV23)  vaccine. One dose is recommended after age 44. Talk to your health care provider about which screenings and vaccines you need and how often you need them. This information is not intended to replace advice given to you by your health care provider. Make sure you discuss any questions you have with your health care provider. Document Released: 05/21/2015 Document Revised: 01/12/2016 Document Reviewed: 02/23/2015 Elsevier Interactive Patient Education  2017 [employer] Prevention in the Home Falls can cause injuries. They can happen to people of all ages. There are many things you can do to make your home safe and to help prevent falls. What can I do on the outside of my home? Regularly fix the edges of walkways and driveways and fix any cracks. Remove anything that might make you trip as you walk through a door, such as a raised step or threshold. Trim any bushes or trees on the path to your home. Use bright outdoor lighting. Clear any walking paths of anything that might make someone trip, such as rocks or tools. Regularly check to see if handrails are loose or broken. Make sure that both sides of any steps have handrails. Any raised decks and porches should have guardrails on the edges. Have any leaves, snow, or ice cleared regularly. Use sand or salt on walking paths during winter. Clean up any spills in your garage right away. This includes oil or grease spills. What can I do in the bathroom? Use night lights. Install grab bars by the toilet and in the tub and shower. Do not use towel bars as grab bars. Use non-skid mats or decals in the tub or shower. If you need to sit down in the shower, use a plastic, non-slip stool. Keep the floor dry. Clean up any water that spills on the floor as soon as it happens. Remove soap buildup in the tub or shower regularly. Attach bath mats securely with double-sided non-slip rug tape. Do not have throw rugs and other things on the floor that  can make you trip. What can I do in the bedroom? Use night lights. Make sure that you have a light by your bed that is easy to reach. Do not use any sheets or blankets that are too big for your bed. They should not hang down onto the floor. Have a firm chair that has side arms. You can use this for support while you get dressed. Do not have throw rugs and other things on the floor that can make you trip. What can I do in the kitchen? Clean up any spills right away. Avoid walking on wet floors. Keep items that you use a lot in easy-to-reach places. If you need to reach something above you, use a strong step stool that has a grab bar. Keep electrical cords out of the way. Do not use floor polish or wax that makes floors slippery. If you must use wax, use non-skid floor wax. Do not have throw rugs and other things on the floor that can make you trip. What can I do with my stairs? Do not leave any items on the stairs. Make sure that there are handrails on both sides of the stairs and use them. Fix handrails that are broken or loose. Make sure that handrails are as long as the stairways. Check any carpeting to make sure that it is firmly attached to the stairs. Fix any carpet that is loose or  worn. Avoid having throw rugs at the top or bottom of the stairs. If you do have throw rugs, attach them to the floor with carpet tape. Make sure that you have a light switch at the top of the stairs and the bottom of the stairs. If you do not have them, ask someone to add them for you. What else can I do to help prevent falls? Wear shoes that: Do not have high heels. Have rubber bottoms. Are comfortable and fit you well. Are closed at the toe. Do not wear sandals. If you use a stepladder: Make sure that it is fully opened. Do not climb a closed stepladder. Make sure that both sides of the stepladder are locked into place. Ask someone to hold it for you, if possible. Clearly mark and make sure that you  can see: Any grab bars or handrails. First and last steps. Where the edge of each step is. Use tools that help you move around (mobility aids) if they are needed. These include: Canes. Walkers. Scooters. Crutches. Turn on the lights when you go into a dark area. Replace any light bulbs as soon as they burn out. Set up your furniture so you have a clear path. Avoid moving your furniture around. If any of your floors are uneven, fix them. If there are any pets around you, be aware of where they are. Review your medicines with your doctor. Some medicines can make you feel dizzy. This can increase your chance of falling. Ask your doctor what other things that you can do to help prevent falls. This information is not intended to replace advice given to you by your health care provider. Make sure you discuss any questions you have with your health care provider. Document Released: 02/18/2009 Document Revised: 09/30/2015 Document Reviewed: 05/29/2014 Elsevier Interactive Patient Education  2017 [employer].

## 2021-07-19 ENCOUNTER — Ambulatory Visit: Payer: Medicare Other | Admitting: Medical

## 2021-07-21 HISTORY — PX: CARPAL TUNNEL RELEASE: SHX101

## 2021-07-25 ENCOUNTER — Ambulatory Visit: Payer: Medicare Other | Admitting: Medical

## 2021-07-26 ENCOUNTER — Encounter (HOSPITAL_COMMUNITY): Payer: Self-pay | Admitting: Cardiology

## 2021-07-26 ENCOUNTER — Other Ambulatory Visit: Payer: Self-pay

## 2021-07-26 ENCOUNTER — Ambulatory Visit (HOSPITAL_COMMUNITY)
Admission: RE | Admit: 2021-07-26 | Discharge: 2021-07-26 | Disposition: A | Discharge status: Home / Self Care | Payer: Medicare Other | Source: Ambulatory Visit | Attending: Cardiology | Admitting: Cardiology

## 2021-07-26 ENCOUNTER — Other Ambulatory Visit (HOSPITAL_COMMUNITY): Payer: Self-pay

## 2021-07-26 VITALS — BP 118/60 | HR 109 | Wt 132.2 lb

## 2021-07-26 DIAGNOSIS — I482 Chronic atrial fibrillation, unspecified: Secondary | ICD-10-CM

## 2021-07-26 DIAGNOSIS — N186 End stage renal disease: Secondary | ICD-10-CM | POA: Diagnosis not present

## 2021-07-26 DIAGNOSIS — I2510 Atherosclerotic heart disease of native coronary artery without angina pectoris: Secondary | ICD-10-CM | POA: Diagnosis not present

## 2021-07-26 DIAGNOSIS — I351 Nonrheumatic aortic (valve) insufficiency: Secondary | ICD-10-CM | POA: Insufficient documentation

## 2021-07-26 DIAGNOSIS — Z79899 Other long term (current) drug therapy: Secondary | ICD-10-CM | POA: Insufficient documentation

## 2021-07-26 DIAGNOSIS — K7460 Unspecified cirrhosis of liver: Secondary | ICD-10-CM | POA: Diagnosis not present

## 2021-07-26 DIAGNOSIS — I5032 Chronic diastolic (congestive) heart failure: Secondary | ICD-10-CM | POA: Diagnosis not present

## 2021-07-26 DIAGNOSIS — D500 Iron deficiency anemia secondary to blood loss (chronic): Secondary | ICD-10-CM | POA: Insufficient documentation

## 2021-07-26 DIAGNOSIS — I5030 Unspecified diastolic (congestive) heart failure: Secondary | ICD-10-CM | POA: Diagnosis not present

## 2021-07-26 DIAGNOSIS — I428 Other cardiomyopathies: Secondary | ICD-10-CM | POA: Diagnosis not present

## 2021-07-26 DIAGNOSIS — I484 Atypical atrial flutter: Secondary | ICD-10-CM | POA: Insufficient documentation

## 2021-07-26 DIAGNOSIS — I480 Paroxysmal atrial fibrillation: Secondary | ICD-10-CM | POA: Insufficient documentation

## 2021-07-26 DIAGNOSIS — Z992 Dependence on renal dialysis: Secondary | ICD-10-CM | POA: Diagnosis not present

## 2021-07-26 DIAGNOSIS — I132 Hypertensive heart and chronic kidney disease with heart failure and with stage 5 chronic kidney disease, or end stage renal disease: Secondary | ICD-10-CM | POA: Diagnosis not present

## 2021-07-26 LAB — CBC
HCT: 32.5 % — ABNORMAL LOW (ref 36.0–46.0)
Hemoglobin: 10.7 g/dL — ABNORMAL LOW (ref 12.0–15.0)
MCH: 30.9 pg (ref 26.0–34.0)
MCHC: 32.9 g/dL (ref 30.0–36.0)
MCV: 93.9 fL (ref 80.0–100.0)
Platelets: 155 10*3/uL (ref 150–400)
RBC: 3.46 MIL/uL — ABNORMAL LOW (ref 3.87–5.11)
RDW: 14.8 % (ref 11.5–15.5)
WBC: 6.7 10*3/uL (ref 4.0–10.5)
nRBC: 0 % (ref 0.0–0.2)

## 2021-07-26 MED ORDER — APIXABAN 5 MG PO TABS: 5.0000 mg | ORAL_TABLET | Freq: Two times a day (BID) | ORAL | 0 refills | Status: DC

## 2021-07-26 NOTE — H&P (View-Only) (Signed)
?PCP: Dr. Colin Benton ?Cardiology: Dr. Caryl Comes ?HF Cardiology: Dr. [name] ? ?76 y.o. with history of nonischemic cardiomyopathy with recovery of EF with BiV pacing, recurrent GI bleeding, and paroxysmal atrial flutter was referred by Dr. Caryl Comes for evaluation of CHF.  Patient has a history of presumed nonischemic cardiomyopathy for a number of years.  Echo in 12/15 showed EF 25-30% and Cardiolite at that time showed no ischemia.  She had Medtronic CRT-D system placed, and EF recovered. In 4/19, echo showed EF 60-65%.   ? ?She has additionally had problems with GI bleeding.  She had been on Eliquis for atrial flutter but this was stopped with recurrent GI bleeding.  She had bleeding from a gastric ulcer but was also noted to have multiple small bowel AVMs on capsule endoscopy out of reach of endoscope.  Eliquis was stopped. She has had no melena or BRBPR off Eliquis.  ? ?Echo in 10/19 showed EF 55-60%, mild-moderate MR, moderate AI, moderate RV dilation.  ? ?She was admitted in 1/20 with diverticulitis and ended up having a sigmoid colectomy.   ? ?Echo in 9/20 showed EF lower at 45-50%, moderate AI.  ? ?She had LHC/RHC in 8/21 with nonobstructive CAD, normal filling pressures, and preserved cardiac output. Echo 8/21 showed EF 50-55%, mild LVH, mildly decreased RV systolic function, moderate aortic insufficiency. ? ?Back in in A fib the end of October. Amio was increased and DC-CV was set up for November 19th.  Chemically converted so DC-CV canceled.   ? ?Seen in [facility] Clinic in 10/21 and was more fatigued. Found to be in atrial flutter but hgb also 6.9. She was on Eliquis at the time. Loaded on amio and converted to NSR. Patient underwent small bowel enteroscopy 03/02/2020 with findings trace esophageal varix, small superficial AVMs prepyloric stomach with other punctate vascular lesions treated with APC, benign duodenal polyp.  Etiology likely secondary to jejunal AVM versus early GAVE vs portal gastropathy.Patient  underwent small bowel enteroscopy 03/02/2020 with findings trace esophageal varix, small superficial AVMs prepyloric stomach with other punctate vascular lesions treated with APC, benign duodenal polyp.  Etiology likely secondary to jejunal AVM versus early GAVE vs portal gastropathy. Eliquis restarted.  ?  ?Hgb dropped back down to 7.7 in 12/21. Saw [name] and capsule placed but got stuck in the stomach.  ?  ?Had volume overload on device in 05/24/20 and torsemide increased by Dr. [name]  ?  ?She was admitted on 05/27/20 with dizziness, and a fall preceded by melena found to have further GI bleeding hgb of 5.1 and an AKI.  Eliquis was held and she was transfused 2u PRBC's.  Repeat pill endoscope showed bleeding in distal small bowel.  Colonoscopy found no bleeding but large avm in cecum argon lasered.  She has been off anticoagulants.  ? ?Admitted 06/23/20 from [place] with markedly elevated filling pressures. Aggressively diuresed but had worsening renal function and progressed to ESRD. Nephrology consulted. She tolerated HD well and is on MWF schedule.  Echo in 2/22 showed EF 55-60%, low normal RV systolic function.  ? ?Saw EP, turned down for Watchman due to comorbidities.  ? ?Echo in 2/23 showed EF 55-60%, moderate LVH, moderate RV enlargement with mildly decreased RV systolic function, moderate TR, ?moderate-severe AI.  ? ?She returns for followup of CHF.  At last appointment, she was in atypical atrial flutter.  She remains in atypical flutter today with rate in 100s.  She has had no recent BRBPR/melena.  No significant exertional  dyspnea or chest pain.  She is tolerating HD well. She was taken off Coreg due to low BP at HD. No lightheadedness/syncope.   ? ?ECG (personally reviewed): Atypical atrial flutter at 109 bpm, BiV pacing ? ?Medtronic device interrogation: >99% BiV pacing, stable thoracic impedance.  ? ?Labs (9/19):  Hgb 10.2, K 3.8, creatinine 2.5 ?Labs (02/26/2018): K 3.4, creatinine 2.2, WBC 76.   ?Labs (2/20): K 3.9, creatinine 1.8 ?Labs (8/20): K 3.8, creatinine 1.98 ?Labs (3/21): LDL 33 ?Labs (5/21): K 3.6, creatinine 1.69 ?Labs (7/21): K 4, creatinine 1.94 ?Labs (9/21): K 4.5, creatinine 2.24, TSH normal, HFTs normal  ?Labs (11/21): K 4.2 Creatinine 3.05  ?Labs (1/22): K 4.0, creatinine 2.22 ?Labs (2/22): K 4.1, creatinine 2.54 ?Labs (8/22): TSH 6.06 ?Labs (9/22): LFTs normal, hgb 10.5.  ? ?PMH: ?1. Diabetes: diet-controlled.  ?2. Gout ?3. HCV: Treated with Harvoni.  ?4. Atrial flutter: Atypical.  Had DCCV in 3/19, maintained on amiodarone.  ?5. HTN ?6. ESRD: MWF HD ?7. H/o SBO ?8. Obesity ?9. Nonischemic cardiomyopathy: Echo in 12/15 with EF 25-30%.  Cardiolite in 12/15 with no ischemia.  She had a Medtronic CRT-D device placed with recovery of EF.   ?- Echo (4/19): EF 60-65% with moderate AI, moderate MR, and PASP 49 mmHg.  ?- RHC 02/13/2018 RA mean 6, RV 49/7,PA 50/18, mean 31,PCWP mean 20, Oxygen saturations:PA 67% AO 97%, Cardiac Output (Fick) 4.6, Cardiac Index (Fick) 2.9, PVR 2.4 WU ?- Echo (10/19): EF 55-60%, mild-moderate MR, moderate AI, moderate RV dilation, PASP 60 mmHg.  ?- PYP scan (3/20): H/CL 1.3, grade 1-2 visually.  Unlikely transthyretin cardiac amyloidosis.  ?- Echo (9/20): EF 45-50%, moderate aortic insufficiency.  ?- LHC/RHC (8/21): 40% pLAD, 50% D1; mean RA 3, PA 41/16, mean PCWP 14, CI 2.74, PVR 2.99.  ?- Echo (8/21): EF 50-55%, mild LVH, grade 2 diastolic dysfunction, normal RV size with mildly decreased systolic function, moderate AI, no AS ?- PYP scan (1/22): H/CL 1.1, grade 1-2 => probably negative for TTR cardiac amyloidosis.  ?- Echo 07/2020  EF 55-60%, moderate LVH, D-shaped interventricular septum, RV moderately dilated with low normal function, severe biatrial enlargement, severe TR.   ?- RHC 06/2020:  RA mean 21 RV 49/20 PA 59/24, mean 39 PCWP mean 26 Cardiac Output (Fick) 5.07 Cardiac Index (Fick) 3.23 ?- Echo (2/23): EF 55-60%, moderate LVH, moderate RV enlargement with  mildly decreased RV systolic function, moderate TR, ?moderate-severe AI.  ?PVR 2.6 WU Cardiac Output (Thermo) 4.72 Cardiac Index (Thermo) 3.01 PVR 2.75 WU PAPI 1.7.  ?10. GI bleeding: Gastric AVM and also gastric ulcer seen in past.  ?- 8/19 capsule endoscopy showed small bowel AVMs beyond the reach of endoscopy.  ?- With recurrent episodes of GI bleeding, Eliquis was stopped.  ?- Capsule study 1/22 showed bleeding in distal small bowel.  Colonoscopy found no bleeding but large avm in cecum argon lasered.  ?11. Fe deficiency anemia.  ?12. Depression. ?13. OSA: Uses CPAP.  ?14. Diverticulitis: Sigmoid colectomy in 1/20.  ? ?ROS: All systems reviewed and negative except as per HPI.  ? ?Social History  ? ?Socioeconomic History  ? Marital status: Divorced  ?  Spouse name: Not on file  ? Number of children: 1  ? Years of education: 96  ? Highest education level: Not on file  ?Occupational History  ? Not on file  ?Tobacco Use  ? Smoking status: Never  ? Smokeless tobacco: Never  ?Vaping Use  ? Vaping Use: Never used  ?Substance  and Sexual Activity  ? Alcohol use: Not Currently  ?  Comment: 01/03/2018 "couple drinks/month"  ? Drug use: Not Currently  ?  Types: Marijuana  ?  Comment: VERY INTERMITTENT   ? Sexual activity: Not Currently  ?Other Topics Concern  ? Not on file  ?Social History Narrative  ? 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  ?   ? Home Situation: lives alone  ?   ? Spiritual Beliefs: [place]  ?   ? Lifestyle: no regular exercise; poor diet  ?   ? She is divorced at least one adult child  ? Never smoker  ?  rare alcohol to occasional at best   ? no substance abuse  ?   ?   ? ?Social Determinants of Health  ? ?Financial Resource Strain: Low Risk   ? Difficulty of Paying Living Expenses: Not hard at all  ?Food Insecurity: No Food Insecurity  ? Worried About Charity fundraiser in the Last Year: Never true  ? Ran Out of Food in the Last Year: Never true  ?Transportation Needs: No Transportation  Needs  ? Lack of Transportation (Medical): No  ? Lack of Transportation (Non-Medical): No  ?Physical Activity: Not on file  ?Stress: No Stress Concern Present  ? Feeling of Stress : Not at all  ?Social Connections

## 2021-07-26 NOTE — Patient Instructions (Signed)
Medication Changes: ? ?Star Eliquis 5 mg Twice daily ? ?Lab Work: ? ?Labs done today, your results will be available in MyChart, we will contact you for abnormal readings. ? ? ?Testing/Procedures: ? ?You are scheduled for a TEE/Cardioversion/TEE Cardioversion on March 28th  with Dr. Loralie Champagne .  Please arrive at the [facility] (Main Entrance A) at [facility]: [address] [place], [place] [address] at 12.30pm. (1 hour prior to procedure unless lab work is needed; if lab work is needed arrive 1.5 hours ahead) ? ?DIET: Nothing to eat or drink after midnight except a sip of water with medications (see medication instructions below) ? ?FYI: For your safety, and to allow [name] to monitor your vital signs accurately during the surgery/procedure we request that   ?if you have artificial nails, gel coating, SNS etc. Please have those removed prior to your surgery/procedure. Not having the nail coverings /polish removed may result in cancellation or delay of your surgery/procedure. ? ? ?Medication Instructions: ? ?Continue your anticoagulant: Eliquis ?You will need to continue your anticoagulant after your procedure until you  are told by your  ?Provider that it is safe to stop ? ?You must have a responsible person to drive you home and stay in the waiting area during your procedure. Failure to do so could result in cancellation. ? ?Interior and spatial designer cards. ? ?*Special Note: Every effort is made to have your procedure done on time. Occasionally there are emergencies that occur at the hospital that may cause delays. Please be patient if a delay does occur.  ? ? ?Referrals: ? ?none ? ?Special Instructions // Education: ? ?none ? ?Follow-Up in: 1 month ? ?At the [facility], you and your health needs are our priority. We have a designated team specialized in the treatment of Heart Failure. This Care Team includes your primary Heart Failure Specialized Cardiologist (physician), Advanced Practice  Providers (APPs- Physician Assistants and Nurse Practitioners), and Pharmacist who all work together to provide you with the care you need, when you need it.  ? ?You may see any of the following providers on your designated Care Team at your next follow up: ? ?Dr Glori Bickers ?Dr Loralie Champagne ?Darrick Grinder, NP ?Lyda Jester, PA ?Jessica Milford,NP ?Marlyce Huge, PA ?Audry Riles, PharmD ? ? ?Please be sure to bring in all your medications bottles to every appointment.  ? ?Need to Contact us: ? ?If you have any questions or concerns before your next appointment please send [name] a message through [place] or call our office at [phone].   ? ?TO LEAVE A MESSAGE FOR THE NURSE SELECT OPTION 2, PLEASE LEAVE A MESSAGE INCLUDING: ?YOUR NAME ?DATE OF BIRTH ?CALL BACK NUMBER ?REASON FOR CALL**this is important as we prioritize the call backs ? ?YOU WILL RECEIVE A CALL BACK THE SAME DAY AS LONG AS YOU CALL BEFORE 4:00 PM ? ? ? ?

## 2021-07-26 NOTE — Progress Notes (Signed)
?PCP: Dr. Colin Benton ?Cardiology: Dr. Caryl Comes ?HF Cardiology: Dr. [name] ? ?76 y.o. with history of nonischemic cardiomyopathy with recovery of EF with BiV pacing, recurrent GI bleeding, and paroxysmal atrial flutter was referred by Dr. Caryl Comes for evaluation of CHF.  Patient has a history of presumed nonischemic cardiomyopathy for a number of years.  Echo in 12/15 showed EF 25-30% and Cardiolite at that time showed no ischemia.  She had Medtronic CRT-D system placed, and EF recovered. In 4/19, echo showed EF 60-65%.   ? ?She has additionally had problems with GI bleeding.  She had been on Eliquis for atrial flutter but this was stopped with recurrent GI bleeding.  She had bleeding from a gastric ulcer but was also noted to have multiple small bowel AVMs on capsule endoscopy out of reach of endoscope.  Eliquis was stopped. She has had no melena or BRBPR off Eliquis.  ? ?Echo in 10/19 showed EF 55-60%, mild-moderate MR, moderate AI, moderate RV dilation.  ? ?She was admitted in 1/20 with diverticulitis and ended up having a sigmoid colectomy.   ? ?Echo in 9/20 showed EF lower at 45-50%, moderate AI.  ? ?She had LHC/RHC in 8/21 with nonobstructive CAD, normal filling pressures, and preserved cardiac output. Echo 8/21 showed EF 50-55%, mild LVH, mildly decreased RV systolic function, moderate aortic insufficiency. ? ?Back in in A fib the end of October. Amio was increased and DC-CV was set up for November 19th.  Chemically converted so DC-CV canceled.   ? ?Seen in HF Clinic in 10/21 and was more fatigued. Found to be in atrial flutter but hgb also 6.9. She was on Eliquis at the time. Loaded on amio and converted to NSR. Patient underwent small bowel enteroscopy 03/02/2020 with findings trace esophageal varix, small superficial AVMs prepyloric stomach with other punctate vascular lesions treated with APC, benign duodenal polyp.  Etiology likely secondary to jejunal AVM versus early GAVE vs portal gastropathy.Patient  underwent small bowel enteroscopy 03/02/2020 with findings trace esophageal varix, small superficial AVMs prepyloric stomach with other punctate vascular lesions treated with APC, benign duodenal polyp.  Etiology likely secondary to jejunal AVM versus early GAVE vs portal gastropathy. Eliquis restarted.  ?  ?Hgb dropped back down to 7.7 in 12/21. Saw [name] and capsule placed but got stuck in the stomach.  ?  ?Had volume overload on device in 05/24/20 and torsemide increased by Dr. [name]  ?  ?She was admitted on 05/27/20 with dizziness, and a fall preceded by melena found to have further GI bleeding hgb of 5.1 and an AKI.  Eliquis was held and she was transfused 2u PRBC's.  Repeat pill endoscope showed bleeding in distal small bowel.  Colonoscopy found no bleeding but large avm in cecum argon lasered.  She has been off anticoagulants.  ? ?Admitted 06/23/20 from [place] with markedly elevated filling pressures. Aggressively diuresed but had worsening renal function and progressed to ESRD. Nephrology consulted. She tolerated HD well and is on MWF schedule.  Echo in 2/22 showed EF 55-60%, low normal RV systolic function.  ? ?Saw EP, turned down for Watchman due to comorbidities.  ? ?Echo in 2/23 showed EF 55-60%, moderate LVH, moderate RV enlargement with mildly decreased RV systolic function, moderate TR, ?moderate-severe AI.  ? ?She returns for followup of CHF.  At last appointment, she was in atypical atrial flutter.  She remains in atypical flutter today with rate in 100s.  She has had no recent BRBPR/melena.  No significant exertional  dyspnea or chest pain.  She is tolerating HD well. She was taken off Coreg due to low BP at HD. No lightheadedness/syncope.   ? ?ECG (personally reviewed): Atypical atrial flutter at 109 bpm, BiV pacing ? ?Medtronic device interrogation: >99% BiV pacing, stable thoracic impedance.  ? ?Labs (9/19):  Hgb 10.2, K 3.8, creatinine 2.5 ?Labs (02/26/2018): K 3.4, creatinine 2.2, WBC 76.   ?Labs (2/20): K 3.9, creatinine 1.8 ?Labs (8/20): K 3.8, creatinine 1.98 ?Labs (3/21): LDL 33 ?Labs (5/21): K 3.6, creatinine 1.69 ?Labs (7/21): K 4, creatinine 1.94 ?Labs (9/21): K 4.5, creatinine 2.24, TSH normal, HFTs normal  ?Labs (11/21): K 4.2 Creatinine 3.05  ?Labs (1/22): K 4.0, creatinine 2.22 ?Labs (2/22): K 4.1, creatinine 2.54 ?Labs (8/22): TSH 6.06 ?Labs (9/22): LFTs normal, hgb 10.5.  ? ?PMH: ?1. Diabetes: diet-controlled.  ?2. Gout ?3. HCV: Treated with Harvoni.  ?4. Atrial flutter: Atypical.  Had DCCV in 3/19, maintained on amiodarone.  ?5. HTN ?6. ESRD: MWF HD ?7. H/o SBO ?8. Obesity ?9. Nonischemic cardiomyopathy: Echo in 12/15 with EF 25-30%.  Cardiolite in 12/15 with no ischemia.  She had a Medtronic CRT-D device placed with recovery of EF.   ?- Echo (4/19): EF 60-65% with moderate AI, moderate MR, and PASP 49 mmHg.  ?- RHC 02/13/2018 RA mean 6, RV 49/7,PA 50/18, mean 31,PCWP mean 20, Oxygen saturations:PA 67% AO 97%, Cardiac Output (Fick) 4.6, Cardiac Index (Fick) 2.9, PVR 2.4 WU ?- Echo (10/19): EF 55-60%, mild-moderate MR, moderate AI, moderate RV dilation, PASP 60 mmHg.  ?- PYP scan (3/20): H/CL 1.3, grade 1-2 visually.  Unlikely transthyretin cardiac amyloidosis.  ?- Echo (9/20): EF 45-50%, moderate aortic insufficiency.  ?- LHC/RHC (8/21): 40% pLAD, 50% D1; mean RA 3, PA 41/16, mean PCWP 14, CI 2.74, PVR 2.99.  ?- Echo (8/21): EF 50-55%, mild LVH, grade 2 diastolic dysfunction, normal RV size with mildly decreased systolic function, moderate AI, no AS ?- PYP scan (1/22): H/CL 1.1, grade 1-2 => probably negative for TTR cardiac amyloidosis.  ?- Echo 07/2020  EF 55-60%, moderate LVH, D-shaped interventricular septum, RV moderately dilated with low normal function, severe biatrial enlargement, severe TR.   ?- RHC 06/2020:  RA mean 21 RV 49/20 PA 59/24, mean 39 PCWP mean 26 Cardiac Output (Fick) 5.07 Cardiac Index (Fick) 3.23 ?- Echo (2/23): EF 55-60%, moderate LVH, moderate RV enlargement with  mildly decreased RV systolic function, moderate TR, ?moderate-severe AI.  ?PVR 2.6 WU Cardiac Output (Thermo) 4.72 Cardiac Index (Thermo) 3.01 PVR 2.75 WU PAPI 1.7.  ?10. GI bleeding: Gastric AVM and also gastric ulcer seen in past.  ?- 8/19 capsule endoscopy showed small bowel AVMs beyond the reach of endoscopy.  ?- With recurrent episodes of GI bleeding, Eliquis was stopped.  ?- Capsule study 1/22 showed bleeding in distal small bowel.  Colonoscopy found no bleeding but large avm in cecum argon lasered.  ?11. Fe deficiency anemia.  ?12. Depression. ?13. OSA: Uses CPAP.  ?14. Diverticulitis: Sigmoid colectomy in 1/20.  ? ?ROS: All systems reviewed and negative except as per HPI.  ? ?Social History  ? ?Socioeconomic History  ? Marital status: Divorced  ?  Spouse name: Not on file  ? Number of children: 1  ? Years of education: 75  ? Highest education level: Not on file  ?Occupational History  ? Not on file  ?Tobacco Use  ? Smoking status: Never  ? Smokeless tobacco: Never  ?Vaping Use  ? Vaping Use: Never used  ?Substance  and Sexual Activity  ? Alcohol use: Not Currently  ?  Comment: 01/03/2018 "couple drinks/month"  ? Drug use: Not Currently  ?  Types: Marijuana  ?  Comment: VERY INTERMITTENT   ? Sexual activity: Not Currently  ?Other Topics Concern  ? Not on file  ?Social History Narrative  ? 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  ?   ? Home Situation: lives alone  ?   ? Spiritual Beliefs: [place]  ?   ? Lifestyle: no regular exercise; poor diet  ?   ? She is divorced at least one adult child  ? Never smoker  ?  rare alcohol to occasional at best   ? no substance abuse  ?   ?   ? ?Social Determinants of Health  ? ?Financial Resource Strain: Low Risk   ? Difficulty of Paying Living Expenses: Not hard at all  ?Food Insecurity: No Food Insecurity  ? Worried About Charity fundraiser in the Last Year: Never true  ? Ran Out of Food in the Last Year: Never true  ?Transportation Needs: No Transportation  Needs  ? Lack of Transportation (Medical): No  ? Lack of Transportation (Non-Medical): No  ?Physical Activity: Not on file  ?Stress: No Stress Concern Present  ? Feeling of Stress : Not at all  ?Social Connections

## 2021-07-28 ENCOUNTER — Ambulatory Visit (INDEPENDENT_AMBULATORY_CARE_PROVIDER_SITE_OTHER): Payer: Medicare Other | Admitting: Medical

## 2021-07-28 VITALS — BP 111/59 | HR 100 | Temp 98.0°F | Resp 18 | Ht 59.0 in | Wt 123.0 lb

## 2021-07-28 DIAGNOSIS — I482 Chronic atrial fibrillation, unspecified: Secondary | ICD-10-CM | POA: Diagnosis not present

## 2021-07-28 DIAGNOSIS — K7469 Other cirrhosis of liver: Secondary | ICD-10-CM

## 2021-07-28 DIAGNOSIS — R739 Hyperglycemia, unspecified: Secondary | ICD-10-CM

## 2021-07-28 DIAGNOSIS — N186 End stage renal disease: Secondary | ICD-10-CM

## 2021-07-28 DIAGNOSIS — B1920 Unspecified viral hepatitis C without hepatic coma: Secondary | ICD-10-CM

## 2021-07-28 DIAGNOSIS — E1159 Type 2 diabetes mellitus with other circulatory complications: Secondary | ICD-10-CM | POA: Diagnosis not present

## 2021-07-28 DIAGNOSIS — I152 Hypertension secondary to endocrine disorders: Secondary | ICD-10-CM

## 2021-07-28 DIAGNOSIS — G4700 Insomnia, unspecified: Secondary | ICD-10-CM

## 2021-07-28 NOTE — Patient Instructions (Addendum)
For chf and atrial fib/flutter continue current med plan on 07-26-2021 visit. ? ?For anemia offered cbc today. You declined stating that was done at dialysis. Please have them send me copy. Fax number [phone] ? ?Ckd-on dialysis. Please fax over cmp panel. ? ?Elevated sugar and no recent a1c. Declined today. Eat low sugar diet. ? ? ?Insmonia- continue trazadone as needed. ? ? ?For cirrhosis follow up with liver specialist. Explain to them about your intermittent left lower quadrant pain. None presently. If reoccurs let me know as I could during interim go ahead and order ct abd/pelvis. ? ? ?Follow up 3 months or sooner if needed. ? ? ? ?

## 2021-07-28 NOTE — Progress Notes (Signed)
? ?Subjective:  ? ? Patient ID: Morgan Clay, female    DOB: 1945/08/28, 76 y.o.   MRN: [id number] ? ?HPI ? ? ?Pt in for follow up. On review 2 days ago just saw cardiologist. ? ?1. Diabetes: diet-controlled.  ?2. Gout ?3. HCV: Treated with Harvoni.  ?4. Atrial flutter: Atypical.  Had DCCV in 3/19, maintained on amiodarone.  ?5. HTN ?6. ESRD: MWF HD ?7. H/o SBO ?8. Obesity ?9. Nonischemic cardiomyopathy: Echo in 12/15 with EF 25-30%.  Cardiolite in 12/15 with no ischemia.  She had a Medtronic CRT-D device placed with recovery of EF.   ?- Echo (4/19): EF 60-65% with moderate AI, moderate MR, and PASP 49 mmHg.  ?- RHC 02/13/2018 RA mean 6, RV 49/7,PA 50/18, mean 31,PCWP mean 20, Oxygen saturations:PA 67% AO 97%, Cardiac Output (Fick) 4.6, Cardiac Index (Fick) 2.9, PVR 2.4 WU ?- Echo (10/19): EF 55-60%, mild-moderate MR, moderate AI, moderate RV dilation, PASP 60 mmHg.  ?- PYP scan (3/20): H/CL 1.3, grade 1-2 visually.  Unlikely transthyretin cardiac amyloidosis.  ?- Echo (9/20): EF 45-50%, moderate aortic insufficiency.  ?- LHC/RHC (8/21): 40% pLAD, 50% D1; mean RA 3, PA 41/16, mean PCWP 14, CI 2.74, PVR 2.99.  ?- Echo (8/21): EF 50-55%, mild LVH, grade 2 diastolic dysfunction, normal RV size with mildly decreased systolic function, moderate AI, no AS ?- PYP scan (1/22): H/CL 1.1, grade 1-2 => probably negative for TTR cardiac amyloidosis.  ?- Echo 07/2020  EF 55-60%, moderate LVH, D-shaped interventricular septum, RV moderately dilated with low normal function, severe biatrial enlargement, severe TR.   ?- RHC 06/2020:  RA mean 21 RV 49/20 PA 59/24, mean 39 PCWP mean 26 Cardiac Output (Fick) 5.07 Cardiac Index (Fick) 3.23 ?- Echo (2/23): EF 55-60%, moderate LVH, moderate RV enlargement with mildly decreased RV systolic function, moderate TR, ?moderate-severe AI.  ?PVR 2.6 WU Cardiac Output (Thermo) 4.72 Cardiac Index (Thermo) 3.01 PVR 2.75 WU PAPI 1.7.  ?10. GI bleeding: Gastric AVM and also gastric ulcer seen in  past.  ?- 8/19 capsule endoscopy showed small bowel AVMs beyond the reach of endoscopy.  ?- With recurrent episodes of GI bleeding, Eliquis was stopped.  ?- Capsule study 1/22 showed bleeding in distal small bowel.  Colonoscopy found no bleeding but large avm in cecum argon lasered.  ?11. Fe deficiency anemia.  ?12. Depression. ?13. OSA: Uses CPAP.  ?14. Diverticulitis: Sigmoid colectomy in 1/20.  ? ? ?Pt has not had any chest pain or palpitations. ?  ?Pt is getting dialysis weekly for ESRD. ?  ?Pt has chronic left arm pain. Wrist to forearm. She states recent surgery done for this. Apears had carpetl tunnel release. ?  ?Hx of htn. Bp well controlled today. ?  ?Pt had elevated tsh recently. Will get tsh and t4 today. ?  ?  ?Hx of insomnia. Treated with trazadone.  ? ?Hypothyroid- pt just started back on thyroid med. She states she asked for refill but our office did not fill?? Nephrologist office refilled. ? ?Pt states left lower abdomen pain on and off for months. Pain can be severe at time. When lays on her side pain decrease. No diarrhea. No fever, no chills or sweats. Last time had pain was 2 days ago. But none today. Will see liver specialist on August 11, 2021. ? ? ? ?Review of Systems  ?Constitutional:  Negative for chills, fatigue and fever.  ?HENT:  Negative for congestion and ear pain.   ?Respiratory:  Negative for chest tightness,  shortness of breath and wheezing.   ?Cardiovascular:  Negative for chest pain and palpitations.  ?Gastrointestinal:  Negative for abdominal pain, diarrhea, nausea and vomiting.  ?     See hpi.  ?Genitourinary:  Negative for dysuria and hematuria.  ?Musculoskeletal:  Negative for arthralgias and back pain.  ?Neurological:  Negative for dizziness, syncope, weakness, numbness and headaches.  ?Hematological:  Negative for adenopathy. Does not bruise/bleed easily.  ?Psychiatric/Behavioral:  Negative for behavioral problems and confusion. The patient is not nervous/anxious.   ? ?    ?Objective:  ? Physical Exam ? ?General ?Mental Status- Alert. General Appearance- Not in acute distress.  ? ?Skin ?General: Color- Normal Color. Moisture- Normal Moisture. ? ?Neck ?Carotid Arteries- Normal color. Moisture- Normal Moisture. No carotid bruits. No JVD. ? ?Chest and Lung Exam ?Auscultation: ?Breath Sounds:-Normal. ? ?Cardiovascular ?Auscultation:Rythm- Regular. ?Murmurs & Other Heart Sounds:Auscultation of the heart reveals- No Murmurs. ? ?Abdomen ?Inspection:-Inspeection Normal. ?Palpation/Percussion:Note:No mass. Palpation and Percussion of the abdomen reveal- Non Tender, Non Distended + BS, no rebound or guarding. No left lower quadrant pain on palpation. ? ? ?Neurologic ?Cranial Nerve exam:- CN III-XII intact(No nystagmus), symmetric smile. ?Strength:- 5/5 equal and symmetric strength both upper and lower extremities.  ? ? ?   ?Assessment & Plan:  ?1. NICM, recovered, now with primarily diastolic CHF:  Echo in 2/29 showed EF improved to 60-65% with MDT BiV pacing. Echo in 10/19 showed stable EF 55-60% with mild-moderate MR, moderate AI. PYP scan in 3/20 was equivocal for transthyretin amyloidosis, repeat PYP scan in 1/22 was probably negative for TTR cardiac amyloidosis.  Echo in 9/20 showed EF back down to 45-50%. LHC/RHC in 8/21 showed nonobstructive CAD, normal filling pressures and cardiac output. Last echo 2/23 showed EF 55-60%, moderate LVH, moderate RV enlargement with mildly decreased RV systolic function, moderate TR, ?moderate-severe AI.  Volume now managed by HD, NYHA class II symptoms and not volume overloaded on exam or by Optivol.  ?- HD for volume management.   ?- Continue Bidil 1/2 tab tid.  ?- Off Coreg with low BP at HD.  ?2. Atrial fibrillation/flutter: Paroxysmal.  She is not on anticoagulation due to multiple GI bleeds, and she was deemed a poor Watchman candidate due to comorbidities by Dr. Quentin Ore.  Currently no evidence for overt bleeding.  She is in atypical atrial flutter  still today with elevated rate, denies palpitations.  ?- I think that she needs an attempt at DCCV to get her back in NSR, especially with tachycardia currently and difficulty tolerating Coreg with HD.  I am going to start her on Eliquis 5 mg bid, arrange for TEE-guided DCCV next week, then plan on stopping Eliquis after 1 month given history of recurrent GI bleeding on chronic anticoagulation. We discussed risks/benefits of procedure and she agreed.  ?- Continue amiodarone, check LFTs and TSH.  She needs regular eye exam.  ? ? ?Patient Instructions  ?For chf and atrial fib/flutter continue current med plan on 07-26-2021 visit. ? ?For anemia offered cbc today. You declined stating that was done at dialysis. Please have them send me copy. Fax number [phone] ? ?Ckd-on dialysis. Please fax over cmp panel. ? ?Elevated sugar and no recent a1c. Declined today. Eat low sugar diet. ? ? ?Insmonia- continue trazadone as needed. ? ? ?For cirrhosis follow up with liver specialist. Explain to them about your intermittent left lower quadrant pain. None presently. If reoccurs let me know as I could during interim go ahead and order ct abd/pelvis. ? ? ?  Follow up 3 months or sooner if needed. ? ? ? Mackie Pai, PA-C  ?

## 2021-08-02 ENCOUNTER — Ambulatory Visit (HOSPITAL_COMMUNITY): Payer: Medicare Other | Admitting: Certified Registered"

## 2021-08-02 ENCOUNTER — Ambulatory Visit
Admission: RE | Admit: 2021-08-02 | Discharge: 2021-08-02 | Disposition: A | Discharge status: Home / Self Care | Payer: Medicare Other | Source: Ambulatory Visit | Attending: Medical | Admitting: Medical

## 2021-08-02 ENCOUNTER — Ambulatory Visit (INDEPENDENT_AMBULATORY_CARE_PROVIDER_SITE_OTHER): Payer: Medicare Other

## 2021-08-02 ENCOUNTER — Encounter (HOSPITAL_COMMUNITY)
Admission: RE | Disposition: A | Discharge status: Home / Self Care | Payer: Self-pay | Source: Home / Self Care | Attending: Cardiology

## 2021-08-02 ENCOUNTER — Ambulatory Visit (HOSPITAL_BASED_OUTPATIENT_CLINIC_OR_DEPARTMENT_OTHER)
Admission: RE | Admit: 2021-08-02 | Discharge: 2021-08-02 | Disposition: A | Discharge status: Home / Self Care | Payer: Medicare Other | Source: Ambulatory Visit | Attending: Cardiology | Admitting: Cardiology

## 2021-08-02 ENCOUNTER — Ambulatory Visit (HOSPITAL_COMMUNITY)
Admission: RE | Admit: 2021-08-02 | Discharge: 2021-08-02 | Disposition: A | Discharge status: Home / Self Care | Payer: Medicare Other | Attending: Cardiology | Admitting: Cardiology

## 2021-08-02 ENCOUNTER — Ambulatory Visit (HOSPITAL_BASED_OUTPATIENT_CLINIC_OR_DEPARTMENT_OTHER): Payer: Medicare Other | Admitting: Certified Registered"

## 2021-08-02 ENCOUNTER — Encounter (HOSPITAL_COMMUNITY): Payer: Self-pay | Admitting: Cardiology

## 2021-08-02 ENCOUNTER — Other Ambulatory Visit: Payer: Self-pay

## 2021-08-02 DIAGNOSIS — I351 Nonrheumatic aortic (valve) insufficiency: Secondary | ICD-10-CM | POA: Diagnosis not present

## 2021-08-02 DIAGNOSIS — Z79899 Other long term (current) drug therapy: Secondary | ICD-10-CM | POA: Insufficient documentation

## 2021-08-02 DIAGNOSIS — Z7901 Long term (current) use of anticoagulants: Secondary | ICD-10-CM | POA: Diagnosis not present

## 2021-08-02 DIAGNOSIS — I480 Paroxysmal atrial fibrillation: Secondary | ICD-10-CM | POA: Insufficient documentation

## 2021-08-02 DIAGNOSIS — I083 Combined rheumatic disorders of mitral, aortic and tricuspid valves: Secondary | ICD-10-CM

## 2021-08-02 DIAGNOSIS — I428 Other cardiomyopathies: Secondary | ICD-10-CM | POA: Insufficient documentation

## 2021-08-02 DIAGNOSIS — Z79890 Hormone replacement therapy (postmenopausal): Secondary | ICD-10-CM | POA: Insufficient documentation

## 2021-08-02 DIAGNOSIS — E1122 Type 2 diabetes mellitus with diabetic chronic kidney disease: Secondary | ICD-10-CM | POA: Insufficient documentation

## 2021-08-02 DIAGNOSIS — N186 End stage renal disease: Secondary | ICD-10-CM | POA: Insufficient documentation

## 2021-08-02 DIAGNOSIS — I5032 Chronic diastolic (congestive) heart failure: Secondary | ICD-10-CM | POA: Diagnosis not present

## 2021-08-02 DIAGNOSIS — D500 Iron deficiency anemia secondary to blood loss (chronic): Secondary | ICD-10-CM | POA: Insufficient documentation

## 2021-08-02 DIAGNOSIS — Z992 Dependence on renal dialysis: Secondary | ICD-10-CM | POA: Insufficient documentation

## 2021-08-02 DIAGNOSIS — I484 Atypical atrial flutter: Secondary | ICD-10-CM | POA: Diagnosis not present

## 2021-08-02 DIAGNOSIS — I361 Nonrheumatic tricuspid (valve) insufficiency: Secondary | ICD-10-CM

## 2021-08-02 DIAGNOSIS — I132 Hypertensive heart and chronic kidney disease with heart failure and with stage 5 chronic kidney disease, or end stage renal disease: Secondary | ICD-10-CM | POA: Diagnosis not present

## 2021-08-02 DIAGNOSIS — K7460 Unspecified cirrhosis of liver: Secondary | ICD-10-CM | POA: Diagnosis not present

## 2021-08-02 DIAGNOSIS — I2510 Atherosclerotic heart disease of native coronary artery without angina pectoris: Secondary | ICD-10-CM | POA: Insufficient documentation

## 2021-08-02 DIAGNOSIS — I110 Hypertensive heart disease with heart failure: Secondary | ICD-10-CM

## 2021-08-02 DIAGNOSIS — I482 Chronic atrial fibrillation, unspecified: Secondary | ICD-10-CM

## 2021-08-02 DIAGNOSIS — I4891 Unspecified atrial fibrillation: Secondary | ICD-10-CM

## 2021-08-02 DIAGNOSIS — Z1231 Encounter for screening mammogram for malignant neoplasm of breast: Secondary | ICD-10-CM

## 2021-08-02 HISTORY — PX: TEE WITHOUT CARDIOVERSION: SHX5443

## 2021-08-02 LAB — CUP PACEART REMOTE DEVICE CHECK
Battery Remaining Longevity: 10 mo
Battery Voltage: 2.86 V
Brady Statistic AP VP Percent: 0.25 %
Brady Statistic AP VS Percent: 0 %
Brady Statistic AS VP Percent: 99.29 %
Brady Statistic AS VS Percent: 0.45 %
Brady Statistic RA Percent Paced: 0.26 %
Brady Statistic RV Percent Paced: 99.46 %
Date Time Interrogation Session: 20230328012304
HighPow Impedance: 42 Ohm
HighPow Impedance: 71 Ohm
Implantable Lead Implant Date: 20080818
Implantable Lead Implant Date: 20080818
Implantable Lead Implant Date: 20080818
Implantable Lead Location: 753858
Implantable Lead Location: 753859
Implantable Lead Location: 753860
Implantable Lead Model: 4193
Implantable Lead Model: 5076
Implantable Lead Model: 6947
Implantable Pulse Generator Implant Date: 20170531
Lead Channel Impedance Value: 266 Ohm
Lead Channel Impedance Value: 323 Ohm
Lead Channel Impedance Value: 399 Ohm
Lead Channel Impedance Value: 4047 Ohm
Lead Channel Impedance Value: 4047 Ohm
Lead Channel Impedance Value: 437 Ohm
Lead Channel Pacing Threshold Amplitude: 1 V
Lead Channel Pacing Threshold Amplitude: 1.5 V
Lead Channel Pacing Threshold Amplitude: 1.875 V
Lead Channel Pacing Threshold Pulse Width: 0.4 ms
Lead Channel Pacing Threshold Pulse Width: 0.4 ms
Lead Channel Pacing Threshold Pulse Width: 0.4 ms
Lead Channel Sensing Intrinsic Amplitude: 15.875 mV
Lead Channel Sensing Intrinsic Amplitude: 15.875 mV
Lead Channel Sensing Intrinsic Amplitude: 2.875 mV
Lead Channel Sensing Intrinsic Amplitude: 2.875 mV
Lead Channel Setting Pacing Amplitude: 2 V
Lead Channel Setting Pacing Amplitude: 2.5 V
Lead Channel Setting Pacing Amplitude: 3 V
Lead Channel Setting Pacing Pulse Width: 0.4 ms
Lead Channel Setting Pacing Pulse Width: 0.4 ms
Lead Channel Setting Sensing Sensitivity: 0.3 mV

## 2021-08-02 SURGERY — ECHOCARDIOGRAM, TRANSESOPHAGEAL
Anesthesia: Monitor Anesthesia Care

## 2021-08-02 MED ORDER — SODIUM CHLORIDE 0.9 % IV SOLN: INTRAVENOUS | Status: DC

## 2021-08-02 MED ORDER — PROPOFOL 500 MG/50ML IV EMUL
INTRAVENOUS | Status: DC | PRN
  Administered 2021-08-02: 125 ug/kg/min via INTRAVENOUS

## 2021-08-02 MED ORDER — LIDOCAINE 2% (20 MG/ML) 5 ML SYRINGE
INTRAMUSCULAR | Status: DC | PRN
  Administered 2021-08-02: 100 mg via INTRAVENOUS

## 2021-08-02 MED ORDER — PHENYLEPHRINE 40 MCG/ML (10ML) SYRINGE FOR IV PUSH (FOR BLOOD PRESSURE SUPPORT)
PREFILLED_SYRINGE | INTRAVENOUS | Status: DC | PRN
  Administered 2021-08-02: 160 ug via INTRAVENOUS
  Administered 2021-08-02: 120 ug via INTRAVENOUS
  Administered 2021-08-02: 40 ug via INTRAVENOUS

## 2021-08-02 MED ORDER — PROPOFOL 10 MG/ML IV BOLUS
INTRAVENOUS | Status: DC | PRN
Start: 2021-08-02 — End: 2021-08-02
  Administered 2021-08-02: 40 mg via INTRAVENOUS

## 2021-08-02 NOTE — CV Procedure (Signed)
.  [phone] ? ?Procedure: TEE ? ?Indication: Aortic insufficiency ? ?Sedation: Per anesthesiology.   ? ?Findings: Please see echo section for full report.  ? ?The patient had spontaneously converted to NSR so TEE was done only to assess aortic valve.  ? ?Normal left ventricular size with moderate LV hypertrophy.  Moderately dilated RV with mildly decreased systolic function.  Moderate left and right atrial enlargement.  No LA appendage thrombus.  No evidence for PFO or ASD by bubble study.  Moderate-severe TR.  Trivial PI.  Trileaflet aortic valve with inadequate coaptation of the noncoronary cusp with the other two leaflets.  Moderate to severe AI, PHT 413 msec and vena contracta 0.49 cm (moderate AI by measurements). I did not see holodiastolic flow reversal in the ascending thoracic aorta.  No significant aortic stenosis.  Mild-moderate MR.  Normal caliber thoracic aorta with mild plaque.  ? ?Visually mod-severe AI, moderate by measurements. Consider cardiac MRI to quantify AI.  ? ?Loralie Champagne ?08/02/2021 ?1:59 PM ? ?

## 2021-08-02 NOTE — Anesthesia Preprocedure Evaluation (Addendum)
Anesthesia Evaluation  ?Patient identified by MRN, date of birth, ID band ?Patient awake ? ? ? ?Reviewed: ?Allergy & Precautions, NPO status , Patient's Chart, lab work & pertinent test results ? ?Airway ?Mallampati: II ? ?TM Distance: >3 FB ?Neck ROM: Full ? ? ? Dental ? ?(+) Missing, Dental Advisory Given, Partial Lower, Partial Upper ?  ?Pulmonary ?asthma , sleep apnea and Continuous Positive Airway Pressure Ventilation , COPD,  ?  ?Pulmonary exam normal ?breath sounds clear to auscultation ? ? ? ? ? ? Cardiovascular ?hypertension, +CHF  ?+ dysrhythmias (LBBB) Atrial Fibrillation + Cardiac Defibrillator ? ?Rhythm:Regular Rate:Normal ? ?ECHO 06/2021 ??1. Left ventricular ejection fraction, by estimation, is 55 to 60%. Left ventricular ejection fraction by 3D volume is 58 %. The left ventricle has normal function. The left ventricle has no regional wall motion abnormalities. There is moderate left ventricular hypertrophy. Left ventricular diastolic parameters are indeterminate.  ??2. Right ventricular systolic function is mildly reduced. The right ventricular size is moderately enlarged. There is mildly elevated pulmonary artery systolic pressure. The estimated right ventricular systolic pressure is 69.4 mmHg.  ??3. Left atrial size was moderately dilated.  ??4. Right atrial size was severely dilated.  ??5. The mitral valve is normal in structure. Mild mitral valve  ?regurgitation.  ??6. Tricuspid valve regurgitation is moderate.  ??7. The inferior vena cava is dilated in size with >50% respiratory variability, suggesting right atrial pressure of 8 mmHg.  ??8. The aortic valve was not well visualized. Aortic valve regurgitation is moderate to severe. No aortic stenosis is present. Moderate AI by PHT and no LV dilatation, but visually AI appears severe. Consider cardiac MRI  ?to quantify AI severity.  ?  ?Neuro/Psych ?PSYCHIATRIC DISORDERS Depression   ? GI/Hepatic ?(+) Cirrhosis  ?   ?  ? , Hepatitis -, C  ?Endo/Other  ?diabetesHypothyroidism  ? Renal/GU ?Renal disease  ? ?  ?Musculoskeletal ? ?(+) Arthritis ,  ? Abdominal ?  ?Peds ? Hematology ?  ?Anesthesia Other Findings ? ? Reproductive/Obstetrics ? ?  ? ? ? ? ? ? ? ? ? ? ? ? ? ?  ?  ? ? ? ? ? ? ? ?Anesthesia Physical ?Anesthesia Plan ? ?ASA: 4 ? ?Anesthesia Plan: MAC  ? ?Post-op Pain Management:   ? ?Induction: Intravenous ? ?PONV Risk Score and Plan: 2 and Propofol infusion, TIVA and Treatment may vary due to age or medical condition ? ?Airway Management Planned: Natural Airway, Nasal Cannula and Simple Face Mask ? ?Additional Equipment: None ? ?Intra-op Plan:  ? ?Post-operative Plan:  ? ?Informed Consent: I have reviewed the patients History and Physical, chart, labs and discussed the procedure including the risks, benefits and alternatives for the proposed anesthesia with the patient or authorized representative who has indicated his/her understanding and acceptance.  ? ? ? ?Dental advisory given ? ?Plan Discussed with: CRNA ? ?Anesthesia Plan Comments:   ? ? ? ? ? ?Anesthesia Quick Evaluation ? ?

## 2021-08-02 NOTE — Progress Notes (Signed)
?  Echocardiogram ?Echocardiogram Transesophageal has been performed. ? ?Morgan Clay ?08/02/2021, 2:20 PM ?

## 2021-08-02 NOTE — Interval H&P Note (Signed)
History and Physical Interval Note: ? ?08/02/2021 ?1:34 PM ? ?Morgan Clay  has presented today for surgery, with the diagnosis of Afib, MVR.  The various methods of treatment have been discussed with the patient and family. After consideration of risks, benefits and other options for treatment, the patient has consented to  Procedure(s): ?TRANSESOPHAGEAL ECHOCARDIOGRAM (TEE) (N/A) as a surgical intervention.  The patient's history has been reviewed, patient examined, no change in status, stable for surgery.  I have reviewed the patient's chart and labs.  Questions were answered to the patient's satisfaction.   ? ? ?Jamaris Biernat [name] ? ? ?

## 2021-08-02 NOTE — Transfer of Care (Signed)
Immediate Anesthesia Transfer of Care Note ? ?Patient: Morgan Clay ? ?Procedure(s) Performed: TRANSESOPHAGEAL ECHOCARDIOGRAM (TEE) ? ?Patient Location: Endoscopy Unit ? ?Anesthesia Type:MAC ? ?Level of Consciousness: drowsy and patient cooperative ? ?Airway & Oxygen Therapy: Patient Spontanous Breathing ? ?Post-op Assessment: Report given to RN and Post -op Vital signs reviewed and stable ? ?Post vital signs: Reviewed and stable ? ?Last Vitals:  ?Vitals Value Taken Time  ?BP 120/39 08/02/21 1358  ?Temp    ?Pulse 70 08/02/21 1402  ?Resp 16 08/02/21 1402  ?SpO2 100 % 08/02/21 1402  ?Vitals shown include unvalidated device data. ? ?Last Pain:  ?Vitals:  ? 08/02/21 1237  ?PainSc: 0-No pain  ?   ? ?  ? ?Complications: No notable events documented. ?

## 2021-08-03 ENCOUNTER — Encounter (HOSPITAL_COMMUNITY): Payer: Self-pay | Admitting: Cardiology

## 2021-08-03 NOTE — Anesthesia Postprocedure Evaluation (Signed)
Anesthesia Post Note ? ?Patient: Morgan Clay ? ?Procedure(s) Performed: TRANSESOPHAGEAL ECHOCARDIOGRAM (TEE) ? ?  ? ?Patient location during evaluation: PACU ?Anesthesia Type: MAC ?Level of consciousness: awake and alert ?Pain management: pain level controlled ?Vital Signs Assessment: post-procedure vital signs reviewed and stable ?Respiratory status: spontaneous breathing ?Cardiovascular status: stable ?Anesthetic complications: no ? ? ?No notable events documented. ? ?Last Vitals:  ?Vitals:  ? 08/02/21 1425 08/02/21 1430  ?BP:  121/63  ?Pulse: 66 67  ?Resp: (!) 9 10  ?Temp:    ?SpO2: 100% 97%  ?  ?Last Pain:  ?Vitals:  ? 08/02/21 1358  ?TempSrc: Temporal  ?PainSc: 0-No pain  ? ? ?  ?  ?  ?  ?  ?  ? ?Nolon Nations ? ? ? ? ?

## 2021-08-05 LAB — ECHO TEE: P 1/2 time: 413 msec

## 2021-08-08 ENCOUNTER — Telehealth (HOSPITAL_COMMUNITY): Payer: Self-pay | Admitting: *Deleted

## 2021-08-08 NOTE — Telephone Encounter (Signed)
From: [name], MD  ?Sent: 08/02/2021   2:00 PM EDT  ?To: Scarlette Calico, RN, Emer Lilia Pro, RN, *  ? ?This patient needs a cardiac MRI without contrast (on dialysis) to quantify aortic insufficiency (needs flow sequences).  Please order ? ? ? ?Order placed, will arrange ?

## 2021-08-10 NOTE — Telephone Encounter (Signed)
Per EP/Device clinic pt's ICD is NOT MRI compatible, have sent message to Dr [name] for further recommendations ?

## 2021-08-11 ENCOUNTER — Other Ambulatory Visit: Payer: Self-pay | Admitting: Nurse Practitioner

## 2021-08-11 DIAGNOSIS — K7469 Other cirrhosis of liver: Secondary | ICD-10-CM

## 2021-08-11 NOTE — Telephone Encounter (Signed)
OK, no MRI then. ?

## 2021-08-15 NOTE — Progress Notes (Signed)
Remote ICD transmission.   

## 2021-08-23 ENCOUNTER — Ambulatory Visit
Admission: RE | Admit: 2021-08-23 | Discharge: 2021-08-23 | Disposition: A | Discharge status: Home / Self Care | Payer: Medicare Other | Source: Ambulatory Visit | Attending: Nurse Practitioner | Admitting: Nurse Practitioner

## 2021-08-23 ENCOUNTER — Other Ambulatory Visit: Payer: Medicare Other

## 2021-08-23 DIAGNOSIS — K7469 Other cirrhosis of liver: Secondary | ICD-10-CM

## 2021-08-29 ENCOUNTER — Telehealth (HOSPITAL_COMMUNITY): Payer: Self-pay

## 2021-08-29 NOTE — Telephone Encounter (Signed)
Called to confirm/remind patient of their appointment at the [facility] on 08/30/21.  ? ?Patient reminded to bring all medications and/or complete list. ? ?Confirmed patient has transportation. Gave directions, instructed to utilize [place] parking. ? ?Confirmed appointment prior to ending call.  ? ?

## 2021-08-29 NOTE — Progress Notes (Signed)
?PCP: Dr. Colin Benton ?Cardiology: Dr. Caryl Comes ?HF Cardiology: Dr. [name] ? ?76 y.o. with history of nonischemic cardiomyopathy with recovery of EF with BiV pacing, recurrent GI bleeding, and paroxysmal atrial flutter was referred by Dr. Caryl Comes for evaluation of CHF.  Patient has a history of presumed nonischemic cardiomyopathy for a number of years.  Echo in 12/15 showed EF 25-30% and Cardiolite at that time showed no ischemia.  She had Medtronic CRT-D system placed, and EF recovered. In 4/19, echo showed EF 60-65%.   ? ?She has additionally had problems with GI bleeding.  She had been on Eliquis for atrial flutter but this was stopped with recurrent GI bleeding.  She had bleeding from a gastric ulcer but was also noted to have multiple small bowel AVMs on capsule endoscopy out of reach of endoscope.  Eliquis was stopped. She has had no melena or BRBPR off Eliquis.  ? ?Echo in 10/19 showed EF 55-60%, mild-moderate MR, moderate AI, moderate RV dilation.  ? ?She was admitted in 1/20 with diverticulitis and ended up having a sigmoid colectomy.   ? ?Echo in 9/20 showed EF lower at 45-50%, moderate AI.  ? ?She had LHC/RHC in 8/21 with nonobstructive CAD, normal filling pressures, and preserved cardiac output. Echo 8/21 showed EF 50-55%, mild LVH, mildly decreased RV systolic function, moderate aortic insufficiency. ? ?Back in in A fib the end of October. Amio was increased and DC-CV was set up for November 19th.  Chemically converted so DC-CV canceled.   ? ?Seen in HF Clinic in 10/21 and was more fatigued. Found to be in atrial flutter but hgb also 6.9. She was on Eliquis at the time. Loaded on amio and converted to NSR. Patient underwent small bowel enteroscopy 03/02/2020 with findings trace esophageal varix, small superficial AVMs prepyloric stomach with other punctate vascular lesions treated with APC, benign duodenal polyp.  Etiology likely secondary to jejunal AVM versus early GAVE vs portal gastropathy.Patient  underwent small bowel enteroscopy 03/02/2020 with findings trace esophageal varix, small superficial AVMs prepyloric stomach with other punctate vascular lesions treated with APC, benign duodenal polyp.  Etiology likely secondary to jejunal AVM versus early GAVE vs portal gastropathy. Eliquis restarted.  ?  ?Hgb dropped back down to 7.7 in 12/21. Saw [name] and capsule placed but got stuck in the stomach.  ?  ?Had volume overload on device in 05/24/20 and torsemide increased by Dr. [name]  ?  ?She was admitted on 05/27/20 with dizziness, and a fall preceded by melena found to have further GI bleeding hgb of 5.1 and an AKI.  Eliquis was held and she was transfused 2u PRBC's.  Repeat pill endoscope showed bleeding in distal small bowel.  Colonoscopy found no bleeding but large avm in cecum argon lasered.  She has been off anticoagulants.  ? ?Admitted 06/23/20 from [place] with markedly elevated filling pressures. Aggressively diuresed but had worsening renal function and progressed to ESRD. Nephrology consulted. She tolerated HD well and is on MWF schedule.  Echo in 2/22 showed EF 55-60%, low normal RV systolic function.  ? ?Saw EP, turned down for Watch[facility] due to comorbidities.  ? ?Echo in 2/23 showed EF 55-60%, moderate LVH, moderate RV enlargement with mildly decreased RV systolic function, moderate TR, ?moderate-severe AI.  ? ?Follow 3/23, remained in AFL rates 100's. Arranged for TEE/DCCV. ? ?Underwent TEE only (3/23) as she spontaneously converted to NSR, showing mild to moderate MR, mod to severe AI. Unable to get MRI due to device. ? ?  Today she returns for HF follow up. Main complaint is muscle aches. Has generally felt bad since starting Eliquis, she attributes her symptoms to starting anticoagulation. She has dyspnea with walking short distances. No issues with HD. Noticed palpitations recently. Denies abnormal bleeding, CP, dizziness/lightheadedness, edema, or PND/Orthopnea. Appetite ok. No fever or chills.  Weight at home 126 pounds. Taking all medications. Off beta blocker due to low BP at HD. ? ?ECG (personally reviewed): A-sensed V-paced, HR 115 bpm (personally reviewed with Adline Peals, PA and Dr. [name]) ? ?Medtronic device interrogation: >99% BiV pacing, OptiVol up but thoracic impedence trending back towards threshold, 1 hour day/activity, appears to be in and out of AFL per device interrogation, but on device rhythm strip appears ST (reviewed with Adline Peals, PA and Dr. [name]). ? ?Labs (9/19):  Hgb 10.2, K 3.8, creatinine 2.5 ?Labs (10/19): K 3.4, creatinine 2.2, WBC 76.  ?Labs (2/20): K 3.9, creatinine 1.8 ?Labs (8/20): K 3.8, creatinine 1.98 ?Labs (3/21): LDL 33 ?Labs (5/21): K 3.6, creatinine 1.69 ?Labs (7/21): K 4, creatinine 1.94 ?Labs (9/21): K 4.5, creatinine 2.24, TSH normal, HFTs normal  ?Labs (11/21): K 4.2 Creatinine 3.05  ?Labs (1/22): K 4.0, creatinine 2.22 ?Labs (2/22): K 4.1, creatinine 2.54 ?Labs (8/22): TSH 6.06 ?Labs (9/22): LFTs normal, hgb 10.5.  ? ?PMH: ?1. Diabetes: diet-controlled.  ?2. Gout ?3. HCV: Treated with Harvoni.  ?4. Atrial flutter: Atypical.  Had DCCV in 3/19, maintained on amiodarone.  ?5. HTN ?6. ESRD: MWF HD ?7. H/o SBO ?8. Obesity ?9. Nonischemic cardiomyopathy: Echo in 12/15 with EF 25-30%.  Cardiolite in 12/15 with no ischemia.  She had a Medtronic CRT-D device placed with recovery of EF.   ?- Echo (4/19): EF 60-65% with moderate AI, moderate MR, and PASP 49 mmHg.  ?- RHC 02/13/2018 RA mean 6, RV 49/7,PA 50/18, mean 31,PCWP mean 20, Oxygen saturations:PA 67% AO 97%, Cardiac Output (Fick) 4.6, Cardiac Index (Fick) 2.9, PVR 2.4 WU ?- Echo (10/19): EF 55-60%, mild-moderate MR, moderate AI, moderate RV dilation, PASP 60 mmHg.  ?- PYP scan (3/20): H/CL 1.3, grade 1-2 visually.  Unlikely transthyretin cardiac amyloidosis.  ?- Echo (9/20): EF 45-50%, moderate aortic insufficiency.  ?- LHC/RHC (8/21): 40% pLAD, 50% D1; mean RA 3, PA 41/16, mean PCWP 14, CI 2.74, PVR 2.99.  ?-  Echo (8/21): EF 50-55%, mild LVH, grade 2 diastolic dysfunction, normal RV size with mildly decreased systolic function, moderate AI, no AS ?- PYP scan (1/22): H/CL 1.1, grade 1-2 => probably negative for TTR cardiac amyloidosis.  ?- Echo 07/2020  EF 55-60%, moderate LVH, D-shaped interventricular septum, RV moderately dilated with low normal function, severe biatrial enlargement, severe TR.   ?- RHC 06/2020:  RA mean 21 RV 49/20 PA 59/24, mean 39 PCWP mean 26 Cardiac Output (Fick) 5.07 Cardiac Index (Fick) 3.23 ?- Echo (2/23): EF 55-60%, moderate LVH, moderate RV enlargement with mildly decreased RV systolic function, moderate TR, ?moderate-severe AI.  ?PVR 2.6 WU Cardiac Output (Thermo) 4.72 Cardiac Index (Thermo) 3.01 PVR 2.75 WU PAPI 1.7.  ?10. GI bleeding: Gastric AVM and also gastric ulcer seen in past.  ?- 8/19 capsule endoscopy showed small bowel AVMs beyond the reach of endoscopy.  ?- With recurrent episodes of GI bleeding, Eliquis was stopped.  ?- Capsule study 1/22 showed bleeding in distal small bowel.  Colonoscopy found no bleeding but large avm in cecum argon lasered.  ?- TEE (3/23): EF 60-65%, moderate LVH, RV ok, moderate to severe MR, moderate to severe AI  with PHT 413 msec and vena contracta 0.49 cm (moderate AI by measurements). ?11. Fe deficiency anemia.  ?12. Depression. ?13. OSA: Uses CPAP.  ?14. Diverticulitis: Sigmoid colectomy in 1/20.  ? ?ROS: All systems reviewed and negative except as per HPI.  ? ?Social History  ? ?Socioeconomic History  ? Marital status: Divorced  ?  Spouse name: Not on file  ? Number of children: 1  ? Years of education: 87  ? Highest education level: Not on file  ?Occupational History  ? Not on file  ?Tobacco Use  ? Smoking status: Never  ? Smokeless tobacco: Never  ?Vaping Use  ? Vaping Use: Never used  ?Substance and Sexual Activity  ? Alcohol use: Not Currently  ?  Comment: 01/03/2018 "couple drinks/month"  ? Drug use: Not Currently  ?  Types: Marijuana  ?  Comment:  VERY INTERMITTENT   ? Sexual activity: Not Currently  ?Other Topics Concern  ? Not on file  ?Social History Narrative  ? 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  ?   ? Home Situation: lives alone  ?   ? Spi

## 2021-08-29 NOTE — H&P (View-Only) (Signed)
?PCP: Dr. Colin Benton ?Cardiology: Dr. Caryl Comes ?HF Cardiology: Dr. [name] ? ?76 y.o. with history of nonischemic cardiomyopathy with recovery of EF with BiV pacing, recurrent GI bleeding, and paroxysmal atrial flutter was referred by Dr. Caryl Comes for evaluation of CHF.  Patient has a history of presumed nonischemic cardiomyopathy for a number of years.  Echo in 12/15 showed EF 25-30% and Cardiolite at that time showed no ischemia.  She had Medtronic CRT-D system placed, and EF recovered. In 4/19, echo showed EF 60-65%.   ? ?She has additionally had problems with GI bleeding.  She had been on Eliquis for atrial flutter but this was stopped with recurrent GI bleeding.  She had bleeding from a gastric ulcer but was also noted to have multiple small bowel AVMs on capsule endoscopy out of reach of endoscope.  Eliquis was stopped. She has had no melena or BRBPR off Eliquis.  ? ?Echo in 10/19 showed EF 55-60%, mild-moderate MR, moderate AI, moderate RV dilation.  ? ?She was admitted in 1/20 with diverticulitis and ended up having a sigmoid colectomy.   ? ?Echo in 9/20 showed EF lower at 45-50%, moderate AI.  ? ?She had LHC/RHC in 8/21 with nonobstructive CAD, normal filling pressures, and preserved cardiac output. Echo 8/21 showed EF 50-55%, mild LVH, mildly decreased RV systolic function, moderate aortic insufficiency. ? ?Back in in A fib the end of October. Amio was increased and DC-CV was set up for November 19th.  Chemically converted so DC-CV canceled.   ? ?Seen in HF Clinic in 10/21 and was more fatigued. Found to be in atrial flutter but hgb also 6.9. She was on Eliquis at the time. Loaded on amio and converted to NSR. Patient underwent small bowel enteroscopy 03/02/2020 with findings trace esophageal varix, small superficial AVMs prepyloric stomach with other punctate vascular lesions treated with APC, benign duodenal polyp.  Etiology likely secondary to jejunal AVM versus early GAVE vs portal gastropathy.Patient  underwent small bowel enteroscopy 03/02/2020 with findings trace esophageal varix, small superficial AVMs prepyloric stomach with other punctate vascular lesions treated with APC, benign duodenal polyp.  Etiology likely secondary to jejunal AVM versus early GAVE vs portal gastropathy. Eliquis restarted.  ?  ?Hgb dropped back down to 7.7 in 12/21. Saw [name] and capsule placed but got stuck in the stomach.  ?  ?Had volume overload on device in 05/24/20 and torsemide increased by Dr. [name]  ?  ?She was admitted on 05/27/20 with dizziness, and a fall preceded by melena found to have further GI bleeding hgb of 5.1 and an AKI.  Eliquis was held and she was transfused 2u PRBC's.  Repeat pill endoscope showed bleeding in distal small bowel.  Colonoscopy found no bleeding but large avm in cecum argon lasered.  She has been off anticoagulants.  ? ?Admitted 06/23/20 from [place] with markedly elevated filling pressures. Aggressively diuresed but had worsening renal function and progressed to ESRD. Nephrology consulted. She tolerated HD well and is on MWF schedule.  Echo in 2/22 showed EF 55-60%, low normal RV systolic function.  ? ?Saw EP, turned down for [facility] due to comorbidities.  ? ?Echo in 2/23 showed EF 55-60%, moderate LVH, moderate RV enlargement with mildly decreased RV systolic function, moderate TR, ?moderate-severe AI.  ? ?Follow 3/23, remained in AFL rates 100's. Arranged for TEE/DCCV. ? ?Underwent TEE only (3/23) as she spontaneously converted to NSR, showing mild to moderate MR, mod to severe AI. Unable to get MRI due to device. ? ?  Today she returns for HF follow up. Main complaint is muscle aches. Has generally felt bad since starting Eliquis, she attributes her symptoms to starting anticoagulation. She has dyspnea with walking short distances. No issues with HD. Noticed palpitations recently. Denies abnormal bleeding, CP, dizziness/lightheadedness, edema, or PND/Orthopnea. Appetite ok. No fever or chills.  Weight at home 126 pounds. Taking all medications. Off beta blocker due to low BP at HD. ? ?ECG (personally reviewed): A-sensed V-paced, HR 115 bpm (personally reviewed with Adline Peals, PA and Dr. [name]) ? ?Medtronic device interrogation: >99% BiV pacing, OptiVol up but thoracic impedence trending back towards threshold, 1 hour day/activity, appears to be in and out of AFL per device interrogation, but on device rhythm strip appears ST (reviewed with Adline Peals, PA and Dr. [name]). ? ?Labs (9/19):  Hgb 10.2, K 3.8, creatinine 2.5 ?Labs (10/19): K 3.4, creatinine 2.2, WBC 76.  ?Labs (2/20): K 3.9, creatinine 1.8 ?Labs (8/20): K 3.8, creatinine 1.98 ?Labs (3/21): LDL 33 ?Labs (5/21): K 3.6, creatinine 1.69 ?Labs (7/21): K 4, creatinine 1.94 ?Labs (9/21): K 4.5, creatinine 2.24, TSH normal, HFTs normal  ?Labs (11/21): K 4.2 Creatinine 3.05  ?Labs (1/22): K 4.0, creatinine 2.22 ?Labs (2/22): K 4.1, creatinine 2.54 ?Labs (8/22): TSH 6.06 ?Labs (9/22): LFTs normal, hgb 10.5.  ? ?PMH: ?1. Diabetes: diet-controlled.  ?2. Gout ?3. HCV: Treated with Harvoni.  ?4. Atrial flutter: Atypical.  Had DCCV in 3/19, maintained on amiodarone.  ?5. HTN ?6. ESRD: MWF HD ?7. H/o SBO ?8. Obesity ?9. Nonischemic cardiomyopathy: Echo in 12/15 with EF 25-30%.  Cardiolite in 12/15 with no ischemia.  She had a Medtronic CRT-D device placed with recovery of EF.   ?- Echo (4/19): EF 60-65% with moderate AI, moderate MR, and PASP 49 mmHg.  ?- RHC 02/13/2018 RA mean 6, RV 49/7,PA 50/18, mean 31,PCWP mean 20, Oxygen saturations:PA 67% AO 97%, Cardiac Output (Fick) 4.6, Cardiac Index (Fick) 2.9, PVR 2.4 WU ?- Echo (10/19): EF 55-60%, mild-moderate MR, moderate AI, moderate RV dilation, PASP 60 mmHg.  ?- PYP scan (3/20): H/CL 1.3, grade 1-2 visually.  Unlikely transthyretin cardiac amyloidosis.  ?- Echo (9/20): EF 45-50%, moderate aortic insufficiency.  ?- LHC/RHC (8/21): 40% pLAD, 50% D1; mean RA 3, PA 41/16, mean PCWP 14, CI 2.74, PVR 2.99.  ?-  Echo (8/21): EF 50-55%, mild LVH, grade 2 diastolic dysfunction, normal RV size with mildly decreased systolic function, moderate AI, no AS ?- PYP scan (1/22): H/CL 1.1, grade 1-2 => probably negative for TTR cardiac amyloidosis.  ?- Echo 07/2020  EF 55-60%, moderate LVH, D-shaped interventricular septum, RV moderately dilated with low normal function, severe biatrial enlargement, severe TR.   ?- RHC 06/2020:  RA mean 21 RV 49/20 PA 59/24, mean 39 PCWP mean 26 Cardiac Output (Fick) 5.07 Cardiac Index (Fick) 3.23 ?- Echo (2/23): EF 55-60%, moderate LVH, moderate RV enlargement with mildly decreased RV systolic function, moderate TR, ?moderate-severe AI.  ?PVR 2.6 WU Cardiac Output (Thermo) 4.72 Cardiac Index (Thermo) 3.01 PVR 2.75 WU PAPI 1.7.  ?10. GI bleeding: Gastric AVM and also gastric ulcer seen in past.  ?- 8/19 capsule endoscopy showed small bowel AVMs beyond the reach of endoscopy.  ?- With recurrent episodes of GI bleeding, Eliquis was stopped.  ?- Capsule study 1/22 showed bleeding in distal small bowel.  Colonoscopy found no bleeding but large avm in cecum argon lasered.  ?- TEE (3/23): EF 60-65%, moderate LVH, RV ok, moderate to severe MR, moderate to severe AI  with PHT 413 msec and vena contracta 0.49 cm (moderate AI by measurements). ?11. Fe deficiency anemia.  ?12. Depression. ?13. OSA: Uses CPAP.  ?14. Diverticulitis: Sigmoid colectomy in 1/20.  ? ?ROS: All systems reviewed and negative except as per HPI.  ? ?Social History  ? ?Socioeconomic History  ? Marital status: Divorced  ?  Spouse name: Not on file  ? Number of children: 1  ? Years of education: 23  ? Highest education level: Not on file  ?Occupational History  ? Not on file  ?Tobacco Use  ? Smoking status: Never  ? Smokeless tobacco: Never  ?Vaping Use  ? Vaping Use: Never used  ?Substance and Sexual Activity  ? Alcohol use: Not Currently  ?  Comment: 01/03/2018 "couple drinks/month"  ? Drug use: Not Currently  ?  Types: Marijuana  ?  Comment:  VERY INTERMITTENT   ? Sexual activity: Not Currently  ?Other Topics Concern  ? Not on file  ?Social History Narrative  ? 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  ?   ? Home Situation: lives alone  ?   ? Spi

## 2021-08-30 ENCOUNTER — Encounter (HOSPITAL_COMMUNITY): Payer: Self-pay

## 2021-08-30 ENCOUNTER — Ambulatory Visit (HOSPITAL_COMMUNITY)
Admission: RE | Admit: 2021-08-30 | Discharge: 2021-08-30 | Disposition: A | Discharge status: Home / Self Care | Payer: Medicare Other | Source: Ambulatory Visit | Attending: Family Medicine | Admitting: Family Medicine

## 2021-08-30 VITALS — BP 102/58 | HR 114 | Wt 130.6 lb

## 2021-08-30 DIAGNOSIS — I132 Hypertensive heart and chronic kidney disease with heart failure and with stage 5 chronic kidney disease, or end stage renal disease: Secondary | ICD-10-CM | POA: Diagnosis not present

## 2021-08-30 DIAGNOSIS — D500 Iron deficiency anemia secondary to blood loss (chronic): Secondary | ICD-10-CM | POA: Diagnosis not present

## 2021-08-30 DIAGNOSIS — Z79899 Other long term (current) drug therapy: Secondary | ICD-10-CM | POA: Diagnosis not present

## 2021-08-30 DIAGNOSIS — Z992 Dependence on renal dialysis: Secondary | ICD-10-CM | POA: Insufficient documentation

## 2021-08-30 DIAGNOSIS — E038 Other specified hypothyroidism: Secondary | ICD-10-CM

## 2021-08-30 DIAGNOSIS — E1122 Type 2 diabetes mellitus with diabetic chronic kidney disease: Secondary | ICD-10-CM | POA: Diagnosis not present

## 2021-08-30 DIAGNOSIS — K7460 Unspecified cirrhosis of liver: Secondary | ICD-10-CM | POA: Diagnosis not present

## 2021-08-30 DIAGNOSIS — I2510 Atherosclerotic heart disease of native coronary artery without angina pectoris: Secondary | ICD-10-CM | POA: Insufficient documentation

## 2021-08-30 DIAGNOSIS — N186 End stage renal disease: Secondary | ICD-10-CM | POA: Diagnosis not present

## 2021-08-30 DIAGNOSIS — R52 Pain, unspecified: Secondary | ICD-10-CM | POA: Insufficient documentation

## 2021-08-30 DIAGNOSIS — Z7901 Long term (current) use of anticoagulants: Secondary | ICD-10-CM | POA: Diagnosis not present

## 2021-08-30 DIAGNOSIS — I351 Nonrheumatic aortic (valve) insufficiency: Secondary | ICD-10-CM | POA: Diagnosis not present

## 2021-08-30 DIAGNOSIS — I482 Chronic atrial fibrillation, unspecified: Secondary | ICD-10-CM

## 2021-08-30 DIAGNOSIS — I484 Atypical atrial flutter: Secondary | ICD-10-CM | POA: Diagnosis not present

## 2021-08-30 DIAGNOSIS — R002 Palpitations: Secondary | ICD-10-CM | POA: Diagnosis not present

## 2021-08-30 DIAGNOSIS — I5032 Chronic diastolic (congestive) heart failure: Secondary | ICD-10-CM | POA: Insufficient documentation

## 2021-08-30 DIAGNOSIS — I428 Other cardiomyopathies: Secondary | ICD-10-CM | POA: Diagnosis not present

## 2021-08-30 DIAGNOSIS — I480 Paroxysmal atrial fibrillation: Secondary | ICD-10-CM | POA: Insufficient documentation

## 2021-08-30 DIAGNOSIS — I4892 Unspecified atrial flutter: Secondary | ICD-10-CM | POA: Diagnosis not present

## 2021-08-30 DIAGNOSIS — D649 Anemia, unspecified: Secondary | ICD-10-CM

## 2021-08-30 DIAGNOSIS — R0600 Dyspnea, unspecified: Secondary | ICD-10-CM | POA: Diagnosis present

## 2021-08-30 LAB — T4, FREE: Free T4: 0.75 ng/dL (ref 0.61–1.12)

## 2021-08-30 LAB — TSH: TSH: 40.132 u[IU]/mL — ABNORMAL HIGH (ref 0.350–4.500)

## 2021-08-30 NOTE — Addendum Note (Signed)
Encounter addended by: Kerry Dory, CMA on: 08/30/2021 3:36 PM ? Actions taken: Visit diagnoses modified, Order list changed, Diagnosis association updated

## 2021-08-30 NOTE — Patient Instructions (Addendum)
Thank you for coming in today ? ?Labs were done today, if any labs are abnormal the clinic will call you ?No news is good news  ? ?You have been referred to EP clinic they will contact you for further details ? ?Your physician recommends that you schedule a follow-up appointment in:  ?1 week for EKG  ?3 months with Dr. [name] ? ?At the [facility], you and your health needs are our priority. As part of our continuing mission to provide you with exceptional heart care, we have created designated Provider Care Teams. These Care Teams include your primary Cardiologist (physician) and Advanced Practice Providers (APPs- Physician Assistants and Nurse Practitioners) who all work together to provide you with the care you need, when you need it.  ? ?You may see any of the following providers on your designated Care Team at your next follow up: ?Dr Glori Bickers ?Dr Loralie Champagne ?Darrick Grinder, NP ?Lyda Jester, PA ?Jessica Milford,NP ?Marlyce Huge, PA ?Audry Riles, PharmD ? ? ?Please be sure to bring in all your medications bottles to every appointment.  ? ?If you have any questions or concerns before your next appointment please send [name] a message through [place] or call our office at [phone].   ? ?TO LEAVE A MESSAGE FOR THE NURSE SELECT OPTION 2, PLEASE LEAVE A MESSAGE INCLUDING: ?YOUR NAME ?DATE OF BIRTH ?CALL BACK NUMBER ?REASON FOR CALL**this is important as we prioritize the call backs ? ?YOU WILL RECEIVE A CALL BACK THE SAME DAY AS LONG AS YOU CALL BEFORE 4:00 PM  ?

## 2021-08-31 ENCOUNTER — Ambulatory Visit (INDEPENDENT_AMBULATORY_CARE_PROVIDER_SITE_OTHER): Payer: Medicare Other | Admitting: Medical

## 2021-08-31 ENCOUNTER — Telehealth (HOSPITAL_COMMUNITY): Payer: Self-pay | Admitting: Surgery

## 2021-08-31 ENCOUNTER — Ambulatory Visit (HOSPITAL_BASED_OUTPATIENT_CLINIC_OR_DEPARTMENT_OTHER)
Admission: RE | Admit: 2021-08-31 | Discharge: 2021-08-31 | Disposition: A | Discharge status: Home / Self Care | Payer: Medicare Other | Source: Ambulatory Visit | Attending: Medical | Admitting: Medical

## 2021-08-31 ENCOUNTER — Telehealth: Payer: Self-pay | Admitting: Medical

## 2021-08-31 VITALS — BP 104/54 | HR 100 | Resp 18 | Ht 59.0 in | Wt 127.8 lb

## 2021-08-31 DIAGNOSIS — M25552 Pain in left hip: Secondary | ICD-10-CM | POA: Diagnosis not present

## 2021-08-31 DIAGNOSIS — M25551 Pain in right hip: Secondary | ICD-10-CM | POA: Insufficient documentation

## 2021-08-31 DIAGNOSIS — M79641 Pain in right hand: Secondary | ICD-10-CM | POA: Diagnosis not present

## 2021-08-31 LAB — T3, FREE: T3, Free: 1.1 pg/mL — ABNORMAL LOW (ref 2.0–4.4)

## 2021-08-31 MED ORDER — METHYLPREDNISOLONE 4 MG PO TABS: ORAL_TABLET | ORAL | 0 refills | Status: DC

## 2021-08-31 NOTE — Telephone Encounter (Signed)
Appt scheduled w/ PCP.  

## 2021-08-31 NOTE — Telephone Encounter (Signed)
Patient called back regarding recent fall and stated she was disconnected ? ?Spoke with triage nurse for patient and transferred  ?

## 2021-08-31 NOTE — Patient Instructions (Addendum)
For bilateral hip pain and rt hand pain get xrays of areas today. Considering osteroarthitis of hip and and hand likely dx.Will prescribe medrol 4 day taper and have you stay on same gabapentin 300 mg once daily but can occasionally use twice daily. ? ?After xray review may advise  thumb spica splint. Also might advise referral back to hand specialist. ? ?Follow up in 7 days or sooner if needed. ?

## 2021-08-31 NOTE — Telephone Encounter (Signed)
Sent patient to triage ? ?Patient stated she is very weak and her legs have been giving out on her ? ?She had a "fall" ysterday/this morning. She said she just went to the ground ?

## 2021-08-31 NOTE — Telephone Encounter (Signed)
I called patient to inform her of the results and recommendations per provider.  She says she has been taking thyroid medication as prescribed and tells me that she scheduled a PCP appt today. I asked that she call back with any concerns or questions. ?

## 2021-08-31 NOTE — Telephone Encounter (Signed)
-----   Message from Rafael Bihari, [place] sent at 08/31/2021  1:25 PM EDT ----- ?TSH very elevated and free T3 is low, however T4 is normal. Has she been taking her thyroid medication?  ? ?She needs to continue amiodarone for now until we can get HR better controlled. She needs to see her PCP soon. ?

## 2021-08-31 NOTE — Telephone Encounter (Signed)
Nurse Assessment ?Nurse: Thad Ranger RN, Denise Date/Time (Eastern Time): 08/31/2021 8:58:44 AM ?Confirm and document reason for call. If ?symptomatic, describe symptoms. ?---Pt legs got weak and she sat down on the ground ?yesterday. Her legs are still weak. ?Does the patient have any new or worsening ?symptoms? ---Yes ?Will a triage be completed? ---Yes ?Related visit to physician within the last 2 weeks? ---No ?Does the PT have any chronic conditions? (i.e. ?diabetes, asthma, this includes High risk factors for ?pregnancy, etc.) ?---Yes ?List chronic conditions. ---CAD ?Is this a behavioral health or substance abuse call? ---No ?Guidelines ?Guideline Title Affirmed Question Affirmed Notes Nurse Date/Time (Eastern ?Time) ?Weakness ?(Generalized) and ?Fatigue ?[1] SEVERE ?weakness (i.e., ?unable to walk or ?barely able to walk, ?requires support) ?AND [2] new-onset ?or worsening ?Thad Ranger, RN, Langley Gauss 08/31/2021 9:01:17 ?AM ?Disp. Time (Eastern ?Time) Disposition Final User ?08/31/2021 9:04:43 AM 911 Outcome Documentation Carmon, RN, Langley Gauss ?PLEASE NOTE: All timestamps contained within this report are represented as [place] Standard Time. ?CONFIDENTIALTY NOTICE: This fax transmission is intended only for the addressee. It contains information that is legally privileged, confidential or ?otherwise protected from use or disclosure. If you are not the intended recipient, you are strictly prohibited from reviewing, disclosing, copying using ?or disseminating any of this information or taking any action in reliance on or regarding this information. If you have received this fax in error, please ?notify us immediately by telephone so that we can arrange for its return to [name]. Phone: [phone], Toll-Free: [phone], Fax: [phone] ?Page: 2 of 2 ?Call Id: [id number] ?Disp. Time (Eastern ?Time) Disposition Final User ?Reason: Pt refused to dial 911 and ?insist on appt in office ?08/31/2021 9:03:31 AM Call EMS 911 Now Yes Carmon,  RN, Langley Gauss ?Caller Disagree/Comply Disagree ?Caller Understands Yes ?PreDisposition Call Doctor ?Care Advice Given Per Guideline ?CALL EMS 911 NOW: BRING MEDICINES: * Bring a list of your current medicines when you go to the Emergency Department ?(ER). CARE ADVICE given per Weakness and Fatigue (Adult) guideline. ?Comments ?User: Romeo Apple, RN Date/Time Eilene Ghazi Time): 08/31/2021 9:04:18 AM ?Pt extremely rude ?Referrals ?GO TO FACILITY REFUSED ?

## 2021-08-31 NOTE — Telephone Encounter (Signed)
Pt has an appt 08/31/21 ?

## 2021-08-31 NOTE — Progress Notes (Signed)
? ?Subjective:  ? ? Patient ID: Morgan Clay, female    DOB: 1945-09-05, 76 y.o.   MRN: [id number] ? ?HPI ? ?Pt in states both her hips and her upper thighs hurt and feel weak down to her knees. Pt states pain level is about 8/10. Pain for one day.  ? ?No unusual activity in house or outside.  ? ?No back pain. ? ?Pt has rt hand pain on and off for 6 months. ? ?Pt has using gabapentin 2 times a day. ? ? ? ? ? ?Review of Systems  ?Constitutional:  Negative for chills, fatigue and fever.  ?Respiratory:  Negative for cough, chest tightness and wheezing.   ?Cardiovascular:  Negative for chest pain and palpitations.  ?Musculoskeletal:  Positive for arthralgias.  ?     See hpi.  ? ? ?Past Medical History:  ?Diagnosis Date  ? A-fib ([place])   ? on Eliquis  ? AICD (automatic cardioverter/defibrillator) present 10/06/2015  ? Anemia   ? Angiodysplasia of small intestine 06/10/2020  ? Ileum - seen on capsule endoscopy 05/2020 - ablated at colonoscopy  ? ARF (acute renal failure) ([place])   ? Arthritis   ? "qwhere" (01/03/2018)  ? Asthma   ? reports mild asthma since childhood - had COPD on dx list from prior PCP  ? Chronic bronchitis ([place])   ? "get it most years; not this past year though" (01/03/2018)  ? Chronic kidney disease   ? "? stage;  followed by [facility]; [name]" (01/03/2018)  ? Chronic systolic congestive heart failure ([place])   ? Cirrhosis ([place]) 09/14/2020  ? dx by Dr Silvano Rusk   ? COVID-19 05/03/2020  ? DEPRESSION 12/17/2009  ? Annotation: PHQ-9 score = 14 done on 12/17/2009 Qualifier: Diagnosis of  By: Hassell Done FNP, Tori Milks    ? Diabetes mellitus without complication ([place])   ? DIET CONTROLLED   ? Diverticulosis   ? Enteritis   ? FIBROIDS, UTERUS 03/05/2008  ? GI bleeding 01/03/2018  ? Glaucoma   ? Gout   ? Hepatitis C   ? HEPATITIS C - s/p treatment with Harvoni, saw hepatology, Dawn Drazek  ? History of blood transfusion ~ 11/2017  ? Hyperlipemia 12/06/2012  ? HYPERTENSION, BENIGN 04/24/2007  ? Hypothyroidism   ?  LBBB (left bundle branch block)   ? Lower GI bleeding   ? "been dealing w/it since 07/2017" (01/03/2018)  ? Nonischemic cardiomyopathy ([place])   ? OBESITY 05/27/2009  ? OBSTRUCTIVE SLEEP APNEA 11/14/2007  ? no CPAP  ? OSTEOPENIA 09/30/2008  ? Pain and swelling of left upper extremity 08/08/2017  ? Personal history of other infectious and parasitic disease   ? Hepatitis B  ? Pneumonia   ? "several times" (01/03/2018)  ? Pulmonary edema, acute ([place]) 08/09/2017  ? ?  ?Social History  ? ?Socioeconomic History  ? Marital status: Divorced  ?  Spouse name: Not on file  ? Number of children: 1  ? Years of education: 33  ? Highest education level: Not on file  ?Occupational History  ? Not on file  ?Tobacco Use  ? Smoking status: Never  ? Smokeless tobacco: Never  ?Vaping Use  ? Vaping Use: Never used  ?Substance and Sexual Activity  ? Alcohol use: Not Currently  ?  Comment: 01/03/2018 "couple drinks/month"  ? Drug use: Not Currently  ?  Types: Marijuana  ?  Comment: VERY INTERMITTENT   ? Sexual activity: Not Currently  ?Other Topics Concern  ? Not on file  ?  Social History Narrative  ? 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  ?   ? Home Situation: lives alone  ?   ? Spiritual Beliefs: [place]  ?   ? Lifestyle: no regular exercise; poor diet  ?   ? She is divorced at least one adult child  ? Never smoker  ?  rare alcohol to occasional at best   ? no substance abuse  ?   ?   ? ?Social Determinants of Health  ? ?Financial Resource Strain: Low Risk   ? Difficulty of Paying Living Expenses: Not hard at all  ?Food Insecurity: No Food Insecurity  ? Worried About Charity fundraiser in the Last Year: Never true  ? Ran Out of Food in the Last Year: Never true  ?Transportation Needs: No Transportation Needs  ? Lack of Transportation (Medical): No  ? Lack of Transportation (Non-Medical): No  ?Physical Activity: Not on file  ?Stress: No Stress Concern Present  ? Feeling of Stress : Not at all  ?Social Connections: Moderately Isolated   ? Frequency of Communication with Friends and Family: More than three times a week  ? Frequency of Social Gatherings with Friends and Family: Three times a week  ? Attends Religious Services: More than 4 times per year  ? Active Member of Clubs or Organizations: No  ? Attends Archivist Meetings: Never  ? Marital Status: Divorced  ?Intimate Partner Violence: Not At Risk  ? Fear of Current or Ex-Partner: No  ? Emotionally Abused: No  ? Physically Abused: No  ? Sexually Abused: No  ? ? ?Past Surgical History:  ?Procedure Laterality Date  ? A/V FISTULAGRAM N/A 03/28/2021  ? Procedure: A/V FISTULAGRAM;  Surgeon: Waynetta Sandy, MD;  Location: [facility];  Service: Cardiovascular;  Laterality: N/A;  ? APPENDECTOMY    ? AV FISTULA PLACEMENT Left 08/05/2020  ? Procedure: LEFT UPPER EXTREMITY ARTERIOVENOUS (AV) FISTULA CREATION;  Surgeon: Cherre Robins, MD;  Location: [place];  Service: Vascular;  Laterality: Left;  ? BASCILIC VEIN TRANSPOSITION Left 09/16/2020  ? Procedure: LEFT SECOND STAGE BASCILIC VEIN TRANSPOSITION;  Surgeon: Cherre Robins, MD;  Location: [facility] OR;  Service: Vascular;  Laterality: Left;  PERIPHERAL NERVE BLOCK  ? CARDIAC CATHETERIZATION    ? CARDIOVERSION N/A 12/14/2014  ? Procedure: CARDIOVERSION;  Surgeon: Dorothy Spark, MD;  Location: [place];  Service: Cardiovascular;  Laterality: N/A;  ? CARDIOVERSION N/A 02/26/2017  ? Procedure: CARDIOVERSION;  Surgeon: Evans Lance, MD;  Location: [place] [facility];  Service: Cardiovascular;  Laterality: N/A;  ? CARDIOVERSION N/A 07/24/2017  ? Procedure: CARDIOVERSION;  Surgeon: Thayer Headings, MD;  Location: [place];  Service: Cardiovascular;  Laterality: N/A;  ? CARDIOVERSION N/A 02/12/2020  ? Procedure: CARDIOVERSION;  Surgeon: [name], MD;  Location: [facility] ENDOSCOPY;  Service: Cardiovascular;  Laterality: N/A;  ? COLONOSCOPY N/A 11/08/2017  ? Procedure: COLONOSCOPY;  Surgeon: Milus Banister, MD;  Location:  [facility];  Service: Endoscopy;  Laterality: N/A;  ? COLONOSCOPY WITH PROPOFOL N/A 05/31/2020  ? Procedure: COLONOSCOPY WITH PROPOFOL;  Surgeon: Irene Shipper, MD;  Location: [facility] ENDOSCOPY;  Service: Endoscopy;  Laterality: N/A;  ? DILATION AND CURETTAGE OF UTERUS    ? ENTEROSCOPY N/A 03/02/2020  ? Procedure: ENTEROSCOPY;  Surgeon: Yetta Flock, MD;  Location: [facility] ENDOSCOPY;  Service: Gastroenterology;  Laterality: N/A;  ? EP IMPLANTABLE DEVICE N/A 10/06/2015  ? Procedure: BIV ICD [employer];  Surgeon: [name], MD;  Location: [place] [facility];  Service: Cardiovascular;  Laterality: N/A;  ? ESOPHAGOGASTRODUODENOSCOPY N/A 10/26/2017  ? Procedure: ESOPHAGOGASTRODUODENOSCOPY (EGD);  Surgeon: Ladene Artist, MD;  Location: [facility];  Service: Endoscopy;  Laterality: N/A;  ? ESOPHAGOGASTRODUODENOSCOPY (EGD) WITH PROPOFOL N/A 11/07/2017  ? Procedure: ESOPHAGOGASTRODUODENOSCOPY (EGD) WITH PROPOFOL;  Surgeon: Milus Banister, MD;  Location: [facility] ENDOSCOPY;  Service: Endoscopy;  Laterality: N/A;  ? ESOPHAGOGASTRODUODENOSCOPY (EGD) WITH PROPOFOL N/A 05/29/2020  ? Procedure: ESOPHAGOGASTRODUODENOSCOPY (EGD) WITH PROPOFOL;  Surgeon: Doran Stabler, MD;  Location: [place];  Service: Gastroenterology;  Laterality: N/A;  ? GIVENS CAPSULE STUDY N/A 05/19/2020  ? Procedure: GIVENS CAPSULE STUDY;  Surgeon: [name], MD;  Location: [facility] ENDOSCOPY;  Service: Endoscopy;  Laterality: N/A;  .adm for obs since pacemaker, PA wil enter order and see pt  ? GIVENS CAPSULE STUDY N/A 05/29/2020  ? Procedure: GIVENS CAPSULE STUDY;  Surgeon: Doran Stabler, MD;  Location: [place];  Service: Gastroenterology;  Laterality: N/A;  ? HEMATOMA EVACUATION Left 09/24/2020  ? Procedure: EVACUATION HEMATOMA ARM;  Surgeon: Rosetta Posner, MD;  Location: [place];  Service: Vascular;  Laterality: Left;  ? HOT HEMOSTASIS N/A 10/26/2017  ? Procedure: HOT HEMOSTASIS (ARGON PLASMA COAGULATION/BICAP);  Surgeon: Ladene Artist, MD;  Location: [facility];  Service: Endoscopy;  Laterality: N/A;  ? HOT HEMOSTASIS N/A 03/02/2020  ? Procedure: HOT HEMOSTASIS (ARGON PLASMA COAGULATION/BICAP);  Surgeon: Yetta Flock, MD

## 2021-09-01 ENCOUNTER — Encounter: Payer: Self-pay | Admitting: Cardiology

## 2021-09-01 ENCOUNTER — Encounter: Payer: Self-pay | Admitting: *Deleted

## 2021-09-06 ENCOUNTER — Other Ambulatory Visit: Payer: Self-pay

## 2021-09-06 ENCOUNTER — Ambulatory Visit
Admission: RE | Admit: 2021-09-06 | Discharge: 2021-09-06 | Disposition: A | Discharge status: Home / Self Care | Payer: Medicare Other | Source: Ambulatory Visit | Attending: Nurse Practitioner | Admitting: Nurse Practitioner

## 2021-09-06 ENCOUNTER — Encounter: Payer: Self-pay | Admitting: Oncology

## 2021-09-06 DIAGNOSIS — Z9289 Personal history of other medical treatment: Secondary | ICD-10-CM | POA: Insufficient documentation

## 2021-09-06 DIAGNOSIS — J42 Unspecified chronic bronchitis: Secondary | ICD-10-CM | POA: Insufficient documentation

## 2021-09-06 DIAGNOSIS — K922 Gastrointestinal hemorrhage, unspecified: Secondary | ICD-10-CM | POA: Insufficient documentation

## 2021-09-06 DIAGNOSIS — I5042 Chronic combined systolic (congestive) and diastolic (congestive) heart failure: Secondary | ICD-10-CM | POA: Insufficient documentation

## 2021-09-06 DIAGNOSIS — I5022 Chronic systolic (congestive) heart failure: Secondary | ICD-10-CM | POA: Insufficient documentation

## 2021-09-06 DIAGNOSIS — M1990 Unspecified osteoarthritis, unspecified site: Secondary | ICD-10-CM | POA: Insufficient documentation

## 2021-09-08 ENCOUNTER — Other Ambulatory Visit (HOSPITAL_COMMUNITY): Payer: Self-pay | Admitting: *Deleted

## 2021-09-08 ENCOUNTER — Ambulatory Visit (HOSPITAL_COMMUNITY)
Admission: RE | Admit: 2021-09-08 | Discharge: 2021-09-08 | Disposition: A | Discharge status: Home / Self Care | Payer: Medicare Other | Source: Ambulatory Visit | Attending: Cardiology | Admitting: Cardiology

## 2021-09-08 ENCOUNTER — Other Ambulatory Visit (HOSPITAL_COMMUNITY): Payer: Self-pay

## 2021-09-08 VITALS — BP 110/60 | HR 115 | Wt 130.7 lb

## 2021-09-08 DIAGNOSIS — I484 Atypical atrial flutter: Secondary | ICD-10-CM | POA: Diagnosis not present

## 2021-09-08 DIAGNOSIS — I4892 Unspecified atrial flutter: Secondary | ICD-10-CM

## 2021-09-08 NOTE — Progress Notes (Signed)
Patient seen for nurse visit. Ekg and medtronic checked. Pt is asymptomatic. Ekg showed pt in aflutter with rate of 115. Ekg read by Dr.McLean. Per Dr.McLean schedule DCCV. DCCV scheduled and instructions verbally given to patient and DCCV instruction sheet given to patient.  ?

## 2021-09-08 NOTE — Patient Instructions (Signed)
Your physician has recommended that you have a Cardioversion (DCCV). Electrical Cardioversion uses a jolt of electricity to your heart either through paddles or wired patches attached to your chest. This is a controlled, usually prescheduled, procedure. Defibrillation is done under light anesthesia in the hospital, and you usually go home the day of the procedure. This is done to get your heart back into a normal rhythm. You are not awake for the procedure. Please see the instruction sheet given to you today.   

## 2021-09-09 ENCOUNTER — Other Ambulatory Visit (HOSPITAL_COMMUNITY): Payer: Self-pay

## 2021-09-09 MED ORDER — APIXABAN 5 MG PO TABS: 5.0000 mg | ORAL_TABLET | Freq: Two times a day (BID) | ORAL | 0 refills | Status: DC

## 2021-09-13 ENCOUNTER — Ambulatory Visit: Payer: Medicare Other | Admitting: Gastroenterology

## 2021-09-13 ENCOUNTER — Telehealth (HOSPITAL_COMMUNITY): Payer: Self-pay

## 2021-09-13 ENCOUNTER — Encounter (HOSPITAL_COMMUNITY): Payer: Self-pay

## 2021-09-13 ENCOUNTER — Encounter: Payer: Self-pay | Admitting: Gastroenterology

## 2021-09-13 VITALS — BP 110/50 | HR 66 | Ht 59.0 in | Wt 125.0 lb

## 2021-09-13 DIAGNOSIS — I8500 Esophageal varices without bleeding: Secondary | ICD-10-CM | POA: Diagnosis not present

## 2021-09-13 DIAGNOSIS — Z7901 Long term (current) use of anticoagulants: Secondary | ICD-10-CM

## 2021-09-13 DIAGNOSIS — K7460 Unspecified cirrhosis of liver: Secondary | ICD-10-CM

## 2021-09-13 NOTE — Progress Notes (Addendum)
09/13/2021 Morgan Clay [id number] 01-May-1946   HISTORY OF PRESENT ILLNESS: This is a 76 year old female is a patient of Dr. Celesta Aver.  She has history of Hepatitis C and cirrhosis related to that, but is well compensated.  [facility].  She was actually referred her back here to schedule an EGD for esophageal varices screening.  Last EGD was in January 2022 without any sign of esophageal varices.  She also has a history of end-stage renal disease on hemodialysis.  His atrial fibrillation is on Eliquis.  She tells me she has only gone back on Eliquis recently and is actually scheduled for cardioversion later this week.  She says that she was told that she will likely only need the Eliquis for a month or so around the time of her cardioversion.  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  No signs of GI bleeding recently or since the above issue in January 2022.   Past Medical History:  Diagnosis Date   Acute blood loss anemia 09/24/2020   AICD (automatic cardioverter/defibrillator) present 10/06/2015   Anemia due to chronic blood loss 04/15/2020   Anemia in chronic kidney disease 01/18/2018   Angiodysplasia of small intestine 06/10/2020   Ileum - seen on capsule endoscopy 05/2020 - ablated at colonoscopy   Aortic atherosclerosis ([place]) 08/30/2017   Arthritis    "qwhere" (01/03/2018)   Asthma    reports mild asthma since childhood - had COPD on dx list from prior PCP   Atrial fibrillation with RVR ([place]) 11/17/2020   Atrial fibrillation, chronic 10/25/2017   Atypical atrial flutter ([place]) 09/28/2015   AVM (arteriovenous malformation) of small bowel, acquired 01/14/2018   Biventricular ICD (implantable cardioverter-defibrillator) in place 01/08/2007   Qualifier: Diagnosis of  By: Hassell Done FNP, Nykedtra     Bleeding pseudoaneurysm of left brachiocephalic arteriovenous fistula  ([place]) 09/24/2020   Bleeding pseudoaneurysm of left brachiocephalic AV fistula ([place]) 09/24/2020   Carpal tunnel syndrome, bilateral 06/09/2021   Chronic bronchitis ([place])    "get it most years; not this past year though" (01/03/2018)   Chronic diastolic CHF (congestive heart failure) ([place]) 04/24/2007   Annotation: secondary to nonischemic cardiomyopathy s/p CRT-D Followed by Dr. Caryl Comes in Cardiology Qualifier: Diagnosis of  By: Hassell Done FNP, Nykedtra     Chronic obstructive pulmonary disease ([place])    Chronic systolic congestive heart failure ([place])    CKD (chronic kidney disease) stage 4, GFR 15-29 ml/min  11/24/2013   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 (HCV) ([place])    HEPATITIS C - s/p treatment with Harvoni, saw hepatology, Dawn Drazek   COVID-19 virus infection 05/27/2020   Diabetes mellitus without complication ([place])    DIET CONTROLLED    Dialysis complication    Diverticulitis of left colon status post robotic low anterior to sigmoid resection 05/22/2018 07/31/2017   End stage renal disease ([place]) 03/22/2021   FIBROIDS, UTERUS 03/05/2008   Glaucoma    Gout    History of blood transfusion ~ 11/2017   Hypertension associated with diabetes ([place]) 06/07/2017   Hypothyroidism    LBBB (left bundle branch block)    Lesion of ulnar nerve 06/09/2021   Long term current use of anticoagulant    Lower GI bleeding    "been dealing w/it since 07/2017" (01/03/2018)   Malnutrition of moderate degree 07/09/2020   MDD (major depressive disorder), recurrent, in  partial remission ([place]) 06/07/2017   Nonischemic cardiomyopathy ([place])    Obesity 05/27/2009   Qualifier: Diagnosis of  By: Hassell Done FNP, Nykedtra     OBSTRUCTIVE SLEEP APNEA 11/14/2007   no CPAP   OSTEOPENIA 09/30/2008   Other cirrhosis of liver ([place]) 08/12/2020   Formatting of this note is different from the original. Patient with history of cirrhosis secondary to hepatitis C with no history of hepatic decompensation.  She has had  some hyponatremia and hypoalbuminemia in the past however most recent labs show relatively well-preserved liver synthetic function.  She will have labs to update liver synthetic function.  Hepatoma screening - patient is currentl   Overweight (BMI 25.0-29.9) 03/02/2020   Pain in right knee 05/13/2018   Persistent atrial fibrillation ([place]) 02/06/2020   Pneumonia    "several times" (01/03/2018)   Polyneuropathy associated with underlying disease ([place]) 06/09/2021   Pulmonary hypertension ([place]) on echocardiogram 01/14/2018   Secondary esophageal varices without bleeding ([place]) 12/23/2020   Formatting of this note might be different from the original. Patient had trace esophageal varices during small bowel enteroscopy in October 2021, follow-up EGD in January 2022 with no varices.  She previously had GI bleeding thought to be from AVM versus GAVE.  She is no longer on carvedilol and will need surveillance endoscopy.  Patient was previously followed at [facility] and I have asked her    Secondary hypercoagulable state ([place]) 02/06/2020   Type 2 diabetes, controlled, with renal manifestation ([place]) 11/24/2013   Past Surgical History:  Procedure Laterality Date   A/V FISTULAGRAM N/A 03/28/2021   Procedure: A/V FISTULAGRAM;  Surgeon: Waynetta Sandy, MD;  Location: [place];  Service: Cardiovascular;  Laterality: N/A;   APPENDECTOMY     AV FISTULA PLACEMENT Left 08/05/2020   Procedure: LEFT UPPER EXTREMITY ARTERIOVENOUS (AV) FISTULA CREATION;  Surgeon: Cherre Robins, MD;  Location: [facility];  Service: Vascular;  Laterality: Left;   [place] Left 09/16/2020   Procedure: LEFT SECOND STAGE [place];  Surgeon: Cherre Robins, MD;  Location: [facility];  Service: Vascular;  Laterality: Left;  PERIPHERAL NERVE BLOCK   CARDIAC CATHETERIZATION     CARDIOVERSION N/A 12/14/2014   Procedure: CARDIOVERSION;  Surgeon: Dorothy Spark, MD;  Location: [facility];  Service: Cardiovascular;  Laterality: N/A;   CARDIOVERSION N/A 02/26/2017   Procedure: CARDIOVERSION;  Surgeon: Evans Lance, MD;  Location: [facility] [facility];  Service: Cardiovascular;  Laterality: N/A;   CARDIOVERSION N/A 07/24/2017   Procedure: CARDIOVERSION;  Surgeon: Acie Fredrickson Wonda Cheng, MD;  Location: [facility] ENDOSCOPY;  Service: Cardiovascular;  Laterality: N/A;   CARDIOVERSION N/A 02/12/2020   Procedure: CARDIOVERSION;  Surgeon: [name], MD;  Location[facility] ENDOSCOPY;  Service: Cardiovascular;  Laterality: N/A;   CARPAL TUNNEL RELEASE  07/21/2021   COLONOSCOPY N/A 11/08/2017   Procedure: COLONOSCOPY;  Surgeon: Milus Banister, MD;  Location: [facility];  Service: Endoscopy;  Laterality: N/A;   COLONOSCOPY WITH PROPOFOL N/A 05/31/2020   Procedure: COLONOSCOPY WITH PROPOFOL;  Surgeon: Irene Shipper, MD;  Location: [facility] ENDOSCOPY;  Service: Endoscopy;  Laterality: N/A;   DILATION AND CURETTAGE OF UTERUS     ENTEROSCOPY N/A 03/02/2020   Procedure: ENTEROSCOPY;  Surgeon: Yetta Flock, MD;  Location: [facility] ENDOSCOPY;  Service: Gastroenterology;  Laterality: N/A;   EP IMPLANTABLE DEVICE N/A 10/06/2015   Procedure: BIV ICD Generator Changeout;  Surgeon: [name], MD;  Location: [place] [facility];  Service: Cardiovascular;  Laterality: N/A;   ESOPHAGOGASTRODUODENOSCOPY N/A 10/26/2017   Procedure: ESOPHAGOGASTRODUODENOSCOPY (EGD);  Surgeon: Ladene Artist, MD;  Location[facility];  Service: Endoscopy;  Laterality: N/A;   ESOPHAGOGASTRODUODENOSCOPY (EGD) WITH PROPOFOL N/A 11/07/2017   Procedure: ESOPHAGOGASTRODUODENOSCOPY (EGD) WITH PROPOFOL;  Surgeon: Milus Banister, MD;  [facility];  Service: Endoscopy;  Laterality: N/A;   ESOPHAGOGASTRODUODENOSCOPY (EGD) WITH PROPOFOL N/A 05/29/2020   Procedure: ESOPHAGOGASTRODUODENOSCOPY (EGD) WITH PROPOFOL;  Surgeon: Doran Stabler, MD;  Location: [facility];  Service: Gastroenterology;  Laterality: N/A;    GIVENS CAPSULE STUDY N/A 05/19/2020   Procedure: GIVENS CAPSULE STUDY;  Surgeon: [name], MD;  Location: [facility];  Service: Endoscopy;  Laterality: N/A;  .adm for obs since pacemaker, PA wil enter order and see pt   GIVENS CAPSULE STUDY N/A 05/29/2020   Procedure: GIVENS CAPSULE STUDY;  Surgeon: Doran Stabler, MD;  Location: [place];  Service: Gastroenterology;  Laterality: N/A;   HEMATOMA EVACUATION Left 09/24/2020   Procedure: EVACUATION HEMATOMA ARM;  Surgeon: Rosetta Posner, MD;  [facility];  Service: Vascular;  Laterality: Left;   HOT HEMOSTASIS N/A 10/26/2017   Procedure: HOT HEMOSTASIS (ARGON PLASMA COAGULATION/BICAP);  Surgeon: Ladene Artist, MD;  Location: [facility];  Service: Endoscopy;  Laterality: N/A;   HOT HEMOSTASIS N/A 03/02/2020   Procedure: HOT HEMOSTASIS (ARGON PLASMA COAGULATION/BICAP);  Surgeon: Yetta Flock, MD;  Location: [facility] ENDOSCOPY;  Service: Gastroenterology;  Laterality: N/A;   HOT HEMOSTASIS N/A 05/31/2020   Procedure: HOT HEMOSTASIS (ARGON PLASMA COAGULATION/BICAP);  Surgeon: Irene Shipper, MD;  Location: [facility] ENDOSCOPY;  Service: Endoscopy;  Laterality: N/A;   INSERT / REPLACE / REMOVE PACEMAKER  ?2008   IR PERC TUN PERIT CATH WO PORT S&I /IMAG  07/05/2020   PERIPHERAL VASCULAR INTERVENTION Left 03/28/2021   Procedure: PERIPHERAL VASCULAR INTERVENTION;  Surgeon: Waynetta Sandy, MD;  Location: [place] [facility];  Service: Cardiovascular;  Laterality: Left;   POLYPECTOMY  11/08/2017   Procedure: POLYPECTOMY;  Surgeon: Milus Banister, MD;  [facility];  Service: Endoscopy;;   PROCTOSCOPY N/A 05/22/2018   Procedure: RIGID PROCTOSCOPY;  Surgeon: Michael Boston, MD;  [facility];  Service: General;  Laterality: N/A;   RIGHT HEART CATH N/A 02/13/2018   Procedure: RIGHT HEART CATH;  Surgeon: [name], MD;  Location: [place] [facility];  Service: Cardiovascular;  Laterality: N/A;   RIGHT HEART  CATH N/A 06/23/2020   Procedure: RIGHT HEART CATH;  Surgeon: [name], MD;  Location: [place] [facility];  Service: Cardiovascular;  Laterality: N/A;   RIGHT/LEFT HEART CATH AND CORONARY ANGIOGRAPHY N/A 12/11/2019   Procedure: RIGHT/LEFT HEART CATH AND CORONARY ANGIOGRAPHY;  Surgeon: [name], MD;  Location: [place] [facility];  Service: Cardiovascular;  Laterality: N/A;   SUBMUCOSAL TATTOO INJECTION  03/02/2020   Procedure: SUBMUCOSAL TATTOO INJECTION;  Surgeon: Yetta Flock, MD;  Location: [facility] ENDOSCOPY;  Service: Gastroenterology;;   SUBMUCOSAL TATTOO INJECTION  05/31/2020   Procedure: SUBMUCOSAL TATTOO INJECTION;  Surgeon: Irene Shipper, MD;  Location: [place];  Service: Endoscopy;;   TEE WITHOUT CARDIOVERSION N/A 08/02/2021   Procedure: TRANSESOPHAGEAL ECHOCARDIOGRAM (TEE);  Surgeon: [name], MD;  Location: [facility] ENDOSCOPY;  Service: Cardiovascular;  Laterality: N/A;   TUBAL LIGATION     XI ROBOTIC ASSISTED LOWER ANTERIOR RESECTION N/A 05/22/2018   Procedure: XI ROBOTIC ASSISTED SIGMOID COLOECTOMY MOBILIZATION OF SPLENIC FLEXURE, FIREFLY ASSESSMENT OF PERFUSION;  Surgeon: Michael Boston, MD;  [facility];  Service: General;  Laterality: N/A;  ERAS PATHWAY    reports that she has never smoked. She has never used smokeless tobacco. She reports that she does not currently use alcohol. She reports that she does not currently use drugs after having used the following drugs: Marijuana. family history includes Asthma in her father and sister; Heart attack in her father and mother; Lung cancer in her sister; Stroke in her brother. No Known Allergies    Outpatient Encounter Medications as of 09/13/2021  Medication Sig   amiodarone (PACERONE) 200 MG tablet Take 1 tablet (200 mg total) by mouth daily.   apixaban (ELIQUIS) 5 MG TABS tablet Take 1 tablet (5 mg total) by mouth 2 (two) times daily.   AURYXIA 1 GM 210 MG(Fe) tablet Take 420 mg by mouth 3 (three) times  daily with meals.   gabapentin (NEURONTIN) 300 MG capsule Take 1 capsule (300 mg total) by mouth daily.   isosorbide-hydrALAZINE (BIDIL) 20-37.5 MG tablet Take 0.5 tablets by mouth 3 (three) times daily.   levothyroxine (SYNTHROID) 137 MCG tablet Take 1 tablet (137 mcg total) by mouth daily before breakfast.   lidocaine-prilocaine (EMLA) cream 1 application. 3 (three) times a week.   traZODone (DESYREL) 50 MG tablet Take 0.5-1 tablets (25-50 mg total) by mouth at bedtime as needed for sleep.   XIIDRA 5 % SOLN Place 1 drop into both eyes 2 (two) times daily.   [DISCONTINUED] allopurinol (ZYLOPRIM) 100 MG tablet Take 100 mg by mouth daily.   [DISCONTINUED] carvedilol (COREG) 3.125 MG tablet Take 3.125 mg by mouth 2 (two) times daily with a meal. (Patient not taking: Reported on 09/13/2021)   [DISCONTINUED] cromolyn (OPTICROM) 4 % ophthalmic solution Place 1 drop into both eyes 4 (four) times daily.   [DISCONTINUED] diclofenac Sodium (VOLTAREN) 1 % GEL Apply 2 g topically 4 (four) times daily.   [DISCONTINUED] oxyCODONE-acetaminophen (PERCOCET) 7.5-325 MG tablet Take 0.5-1 tablets by mouth 3 (three) times daily as needed for pain.   [DISCONTINUED] prednisoLONE acetate (PRED FORTE) 1 % ophthalmic suspension Place 1 drop into both eyes 2 (two) times daily.   No facility-administered encounter medications on file as of 09/13/2021.     REVIEW OF SYSTEMS  : All other systems reviewed and negative except where noted in the History of Present Illness.   PHYSICAL EXAM: BP (!) 110/50   Pulse 66   Ht '4\' 11"'$  (1.499 m)   Wt 125 lb (56.7 kg)   BMI 25.25 kg/m  General: Well developed female in no acute distress Head: Normocephalic and atraumatic Eyes:  Sclerae anicteric, conjunctiva pink. Ears: Normal auditory acuity Lungs: Clear throughout to auscultation; no W/R/R. Heart: Regular rate and rhythm; no M/R/G. Abdomen: Soft, non-distended.  BS present.  Non-tender. Musculoskeletal: Symmetrical with no  gross deformities  Skin: No lesions on visible extremities Extremities: No edema  Neurological: Alert oriented x 4, grossly non-focal Psychological:  Alert and cooperative. Normal mood and affect  ASSESSMENT AND PLAN: *Compensated cirrhosis due to hepatitis C: [facility].  Was referred back here to schedule EGD for screening of esophageal varices.  Schedule with [name] at [facility]. *ESRD on HD MWF *Chronic anticoagulation with Eliquis due to history of atrial fibrillation:  Will hold Eliquis for one prior to endoscopic procedures - will instruct when and how to resume after procedure. Benefits and risks of procedure explained including risks of bleeding, perforation,  infection, missed lesions, reactions to medications and possible need for hospitalization and surgery for complications. Additional rare but real risk of stroke or other vascular clotting events off of Eliquis also explained and need to seek urgent help if any signs of these problems occur. Will communicate by phone or EMR with patient's  prescribing provider, Dr. [name], to confirm that holding Eliquis is reasonable in this case.  She actually tells me that she was just recently put back on the Eliquis again and she is having a cardioversion later this week.  She says that they anticipate that she will only be on this for about a month or so.  I believe her procedure will likely be July so the Eliquis may not be an issue by that point.  CC:  [place], [name]  GI attending:  I have communicated with Dr. [name] of cardiology.  It is acceptable to hold her Eliquis 2 days prior.  He actually indicated he plans to discontinue the Eliquis after the EGD.   [name], MD, [facility]

## 2021-09-13 NOTE — Telephone Encounter (Signed)
Spoke with patient about Cardioversion scheduled for May 11'@2PM'$ .Instructed not to miss any doses of Eliquis. Has transportation to and from procedure. ?

## 2021-09-13 NOTE — Patient Instructions (Addendum)
If you are age 74 or older, your body mass index should be between 23-30. Your Body mass index is 25.25 kg/m?[name] If this is out of the aforementioned range listed, please consider follow up with your Primary Care Provider. ? ?If you are age 77 or younger, your body mass index should be between 19-25. Your Body mass index is 25.25 kg/m?[name] If this is out of the aformentioned range listed, please consider follow up with your Primary Care Provider.  ? ?________________________________________________________ ? ?The [facility] providers would like to encourage you to use [facility] to communicate with providers for non-urgent requests or questions.  Due to long hold times on the telephone, sending your provider a message by [facility] may be a faster and more efficient way to get a response.  Please allow 48 business hours for a response.  Please remember that this is for non-urgent requests.  ?_______________________________________________________ ? ?You have been scheduled for an endoscopy. Please follow written instructions given to you at your visit today. ?If you use inhalers (even only as needed), please bring them with you on the day of your procedure. ? ?Due to recent changes in healthcare laws, you may see the results of your imaging and laboratory studies on MyChart before your provider has had a chance to review them.  We understand that in some cases there may be results that are confusing or concerning to you. Not all laboratory results come back in the same time frame and the provider may be waiting for multiple results in order to interpret others.  Please give [name] 48 hours in order for your provider to thoroughly review all the results before contacting the office for clarification of your results.  ? ? ?It was a pleasure to see you today! ? ?Thank you for trusting me with your gastrointestinal care!   ? ? ?

## 2021-09-15 ENCOUNTER — Other Ambulatory Visit: Payer: Self-pay

## 2021-09-15 ENCOUNTER — Ambulatory Visit (HOSPITAL_BASED_OUTPATIENT_CLINIC_OR_DEPARTMENT_OTHER): Payer: Medicare Other | Admitting: Anesthesiology

## 2021-09-15 ENCOUNTER — Ambulatory Visit (HOSPITAL_COMMUNITY): Payer: Medicare Other

## 2021-09-15 ENCOUNTER — Ambulatory Visit (HOSPITAL_COMMUNITY): Payer: Medicare Other | Admitting: Anesthesiology

## 2021-09-15 ENCOUNTER — Encounter (HOSPITAL_COMMUNITY)
Admission: RE | Disposition: A | Discharge status: Home / Self Care | Payer: Self-pay | Source: Ambulatory Visit | Attending: Cardiology

## 2021-09-15 ENCOUNTER — Ambulatory Visit (HOSPITAL_COMMUNITY)
Admission: RE | Admit: 2021-09-15 | Discharge: 2021-09-15 | Disposition: A | Discharge status: Home / Self Care | Payer: Medicare Other | Source: Ambulatory Visit | Attending: Cardiology | Admitting: Cardiology

## 2021-09-15 DIAGNOSIS — I428 Other cardiomyopathies: Secondary | ICD-10-CM | POA: Diagnosis not present

## 2021-09-15 DIAGNOSIS — J449 Chronic obstructive pulmonary disease, unspecified: Secondary | ICD-10-CM | POA: Diagnosis not present

## 2021-09-15 DIAGNOSIS — Z8711 Personal history of peptic ulcer disease: Secondary | ICD-10-CM | POA: Insufficient documentation

## 2021-09-15 DIAGNOSIS — G4733 Obstructive sleep apnea (adult) (pediatric): Secondary | ICD-10-CM | POA: Insufficient documentation

## 2021-09-15 DIAGNOSIS — E1122 Type 2 diabetes mellitus with diabetic chronic kidney disease: Secondary | ICD-10-CM | POA: Insufficient documentation

## 2021-09-15 DIAGNOSIS — Z79899 Other long term (current) drug therapy: Secondary | ICD-10-CM | POA: Insufficient documentation

## 2021-09-15 DIAGNOSIS — I080 Rheumatic disorders of both mitral and aortic valves: Secondary | ICD-10-CM | POA: Insufficient documentation

## 2021-09-15 DIAGNOSIS — I484 Atypical atrial flutter: Secondary | ICD-10-CM

## 2021-09-15 DIAGNOSIS — I447 Left bundle-branch block, unspecified: Secondary | ICD-10-CM | POA: Insufficient documentation

## 2021-09-15 DIAGNOSIS — Z95810 Presence of automatic (implantable) cardiac defibrillator: Secondary | ICD-10-CM | POA: Diagnosis not present

## 2021-09-15 DIAGNOSIS — Z7901 Long term (current) use of anticoagulants: Secondary | ICD-10-CM | POA: Diagnosis not present

## 2021-09-15 DIAGNOSIS — D500 Iron deficiency anemia secondary to blood loss (chronic): Secondary | ICD-10-CM | POA: Diagnosis not present

## 2021-09-15 DIAGNOSIS — I4891 Unspecified atrial fibrillation: Secondary | ICD-10-CM | POA: Diagnosis not present

## 2021-09-15 DIAGNOSIS — N186 End stage renal disease: Secondary | ICD-10-CM

## 2021-09-15 DIAGNOSIS — I2510 Atherosclerotic heart disease of native coronary artery without angina pectoris: Secondary | ICD-10-CM | POA: Insufficient documentation

## 2021-09-15 DIAGNOSIS — I4892 Unspecified atrial flutter: Secondary | ICD-10-CM | POA: Insufficient documentation

## 2021-09-15 DIAGNOSIS — K7460 Unspecified cirrhosis of liver: Secondary | ICD-10-CM | POA: Diagnosis not present

## 2021-09-15 DIAGNOSIS — Z992 Dependence on renal dialysis: Secondary | ICD-10-CM | POA: Diagnosis not present

## 2021-09-15 DIAGNOSIS — I132 Hypertensive heart and chronic kidney disease with heart failure and with stage 5 chronic kidney disease, or end stage renal disease: Secondary | ICD-10-CM | POA: Insufficient documentation

## 2021-09-15 DIAGNOSIS — I480 Paroxysmal atrial fibrillation: Secondary | ICD-10-CM | POA: Insufficient documentation

## 2021-09-15 DIAGNOSIS — I5032 Chronic diastolic (congestive) heart failure: Secondary | ICD-10-CM | POA: Diagnosis not present

## 2021-09-15 HISTORY — PX: CARDIOVERSION: SHX1299

## 2021-09-15 LAB — POCT I-STAT, CHEM 8
BUN: 8 mg/dL (ref 8–23)
Calcium, Ion: 0.89 mmol/L — CL (ref 1.15–1.40)
Chloride: 97 mmol/L — ABNORMAL LOW (ref 98–111)
Creatinine, Ser: 5.2 mg/dL — ABNORMAL HIGH (ref 0.44–1.00)
Glucose, Bld: 83 mg/dL (ref 70–99)
HCT: 39 % (ref 36.0–46.0)
Hemoglobin: 13.3 g/dL (ref 12.0–15.0)
Potassium: 4.6 mmol/L (ref 3.5–5.1)
Sodium: 136 mmol/L (ref 135–145)
TCO2: 30 mmol/L (ref 22–32)

## 2021-09-15 SURGERY — CARDIOVERSION
Anesthesia: General

## 2021-09-15 MED ORDER — SODIUM CHLORIDE 0.9 % IV SOLN: INTRAVENOUS | Status: DC

## 2021-09-15 MED ORDER — PROPOFOL 10 MG/ML IV BOLUS
INTRAVENOUS | Status: DC | PRN
  Administered 2021-09-15: 50 mg via INTRAVENOUS

## 2021-09-15 MED ORDER — LIDOCAINE 2% (20 MG/ML) 5 ML SYRINGE
INTRAMUSCULAR | Status: DC | PRN
  Administered 2021-09-15: 50 mg via INTRAVENOUS

## 2021-09-15 MED ORDER — SODIUM CHLORIDE 0.9 % IV SOLN
INTRAVENOUS | Status: AC | PRN
Start: 2021-09-15 — End: 2021-09-15
  Administered 2021-09-15: 500 mL via INTRAVENOUS

## 2021-09-15 NOTE — Interval H&P Note (Signed)
History and Physical Interval Note: ? ?09/15/2021 ?2:18 PM ? ?Morgan Clay  has presented today for surgery, with the diagnosis of AFLUTTER.  The various methods of treatment have been discussed with the patient and family. After consideration of risks, benefits and other options for treatment, the patient has consented to  Procedure(s): ?CARDIOVERSION (N/A) as a surgical intervention.  The patient's history has been reviewed, patient examined, no change in status, stable for surgery.  I have reviewed the patient's chart and labs.  Questions were answered to the patient's satisfaction.   ? ? ?Francis Doenges [name] ? ? ?

## 2021-09-15 NOTE — Discharge Instructions (Signed)

## 2021-09-15 NOTE — Anesthesia Preprocedure Evaluation (Deleted)
Anesthesia Evaluation  ? ? ?Airway ? ? ? ? ? ? ? Dental ?  ?Pulmonary ? ?  ? ? ? ? ? ? ? Cardiovascular ?hypertension,  ? ? ?  ?Neuro/Psych ?  ? GI/Hepatic ?  ?Endo/Other  ?diabetes ? Renal/GU ?  ? ?  ?Musculoskeletal ? ? Abdominal ?  ?Peds ? Hematology ?  ?Anesthesia Other Findings ? ? Reproductive/Obstetrics ? ?  ? ? ? ? ? ? ? ? ? ? ? ? ? ?  ?  ? ? ? ? ? ? ? ?                                  Anesthesia Evaluation  ?Patient identified by MRN, date of birth, ID band ?Patient awake ? ? ? ?Reviewed: ?Allergy & Precautions, NPO status , Patient's Chart, lab work & pertinent test results ? ?Airway ?Mallampati: II ? ?TM Distance: >3 FB ?Neck ROM: Full ? ? ? Dental ? ?(+) Missing, Dental Advisory Given, Partial Lower, Partial Upper ?  ?Pulmonary ?asthma , sleep apnea and Continuous Positive Airway Pressure Ventilation , COPD,  ?  ?Pulmonary exam normal ?breath sounds clear to auscultation ? ? ? ? ? ? Cardiovascular ?hypertension, +CHF  ?+ dysrhythmias (LBBB) Atrial Fibrillation + Cardiac Defibrillator ? ?Rhythm:Regular Rate:Normal ? ?ECHO 06/2021 ??1. Left ventricular ejection fraction, by estimation, is 55 to 60%. Left ventricular ejection fraction by 3D volume is 58 %. The left ventricle has normal function. The left ventricle has no regional wall motion abnormalities. There is moderate left ventricular hypertrophy. Left ventricular diastolic parameters are indeterminate.  ??2. Right ventricular systolic function is mildly reduced. The right ventricular size is moderately enlarged. There is mildly elevated pulmonary artery systolic pressure. The estimated right ventricular systolic pressure is 38.2 mmHg.  ??3. Left atrial size was moderately dilated.  ??4. Right atrial size was severely dilated.  ??5. The mitral valve is normal in structure. Mild mitral valve  ?regurgitation.  ??6. Tricuspid valve regurgitation is moderate.  ??7. The inferior vena cava is dilated in size with >50%  respiratory variability, suggesting right atrial pressure of 8 mmHg.  ??8. The aortic valve was not well visualized. Aortic valve regurgitation is moderate to severe. No aortic stenosis is present. Moderate AI by PHT and no LV dilatation, but visually AI appears severe. Consider cardiac MRI  ?to quantify AI severity.  ?  ?Neuro/Psych ?PSYCHIATRIC DISORDERS Depression   ? GI/Hepatic ?(+) Cirrhosis  ?  ?  ? , Hepatitis -, C  ?Endo/Other  ?diabetesHypothyroidism  ? Renal/GU ?Renal disease  ? ?  ?Musculoskeletal ? ?(+) Arthritis ,  ? Abdominal ?  ?Peds ? Hematology ?  ?Anesthesia Other Findings ? ? Reproductive/Obstetrics ? ?  ? ? ? ? ? ? ? ? ? ? ? ? ? ?  ?  ? ? ? ? ? ? ? ?Anesthesia Physical ?Anesthesia Plan ? ?ASA: 4 ? ?Anesthesia Plan: MAC  ? ?Post-op Pain Management:   ? ?Induction: Intravenous ? ?PONV Risk Score and Plan: 2 and Propofol infusion, TIVA and Treatment may vary due to age or medical condition ? ?Airway Management Planned: Natural Airway, Nasal Cannula and Simple Face Mask ? ?Additional Equipment: None ? ?Intra-op Plan:  ? ?Post-operative Plan:  ? ?Informed Consent: I have reviewed the patients History and Physical, chart, labs and discussed the procedure including the risks, benefits and alternatives for the proposed anesthesia with the patient or  authorized representative who has indicated his/her understanding and acceptance.  ? ? ? ?Dental advisory given ? ?Plan Discussed with: CRNA ? ?Anesthesia Plan Comments:   ? ? ? ? ? ?Anesthesia Quick Evaluation ? ?Anesthesia Physical ?Anesthesia Plan ?Anesthesia Quick Evaluation ? ?

## 2021-09-15 NOTE — Procedures (Signed)
Electrical Cardioversion Procedure Note ?Jeri Lager ?[id number] ?Mar 28, 1946 ? ?Procedure: Electrical Cardioversion ?Indications:  Atrial Flutter ? ?Procedure Details ?Consent: Risks of procedure as well as the alternatives and risks of each were explained to the (patient/caregiver).  Consent for procedure obtained. ?Time Out: Verified patient identification, verified procedure, site/side was marked, verified correct patient position, special equipment/implants available, medications/allergies/relevent history reviewed, required imaging and test results available.  Performed ? ?Patient placed on cardiac monitor, pulse oximetry, supplemental oxygen as necessary.  ?Sedation given:  Propofol per anesthesiology ?Pacer pads placed anterior and posterior chest. ? ?Cardioverted 1 time(s).  ?Cardioverted at 200J. ? ?Evaluation ?Findings: Post procedure EKG shows: NSR ?Complications: None ?Patient did tolerate procedure well. ? ? ?Emojean Gertz [name] ?09/15/2021, 2:22 PM ? ? ? ?

## 2021-09-15 NOTE — Anesthesia Preprocedure Evaluation (Addendum)
Anesthesia Evaluation  ?Patient identified by MRN, date of birth, ID band ?Patient awake ? ? ? ?Reviewed: ?Allergy & Precautions, NPO status , Patient's Chart, lab work & pertinent test results ? ?Airway ?Mallampati: I ? ?TM Distance: >3 FB ?Neck ROM: Full ? ? ? Dental ? ?(+) Partial Lower, Partial Upper, Dental Advisory Given ?  ?Pulmonary ?asthma , sleep apnea and Continuous Positive Airway Pressure Ventilation , COPD,  ?  ?Pulmonary exam normal ? ? ? ? ? ? ? Cardiovascular ?hypertension, +CHF  ?+ dysrhythmias (LBBB) Atrial Fibrillation + Cardiac Defibrillator ?+ Valvular Problems/Murmurs AI and MR  ?Rhythm:Irregular Rate:Tachycardia ? ?ECHO 06/2021 ??1. Left ventricular ejection fraction, by estimation, is 55 to 60%. Left ventricular ejection fraction by 3D volume is 58 %. The left ventricle has normal function. The left ventricle has no regional wall motion abnormalities. There is moderate left ventricular hypertrophy. Left ventricular diastolic parameters are indeterminate.  ??2. Right ventricular systolic function is mildly reduced. The right ventricular size is moderately enlarged. There is mildly elevated pulmonary artery systolic pressure. The estimated right ventricular systolic pressure is 15.1 mmHg.  ??3. Left atrial size was moderately dilated.  ??4. Right atrial size was severely dilated.  ??5. The mitral valve is normal in structure. Mild mitral valve  ?regurgitation.  ??6. Tricuspid valve regurgitation is moderate.  ??7. The inferior vena cava is dilated in size with >50% respiratory variability, suggesting right atrial pressure of 8 mmHg.  ??8. The aortic valve was not well visualized. Aortic valve regurgitation is moderate to severe. No aortic stenosis is present. Moderate AI by PHT and no LV dilatation, but visually AI appears severe. Consider cardiac MRI  ?to quantify AI severity.  ?  ?Neuro/Psych ?PSYCHIATRIC DISORDERS Depression   ? GI/Hepatic ?(+) Cirrhosis  ?   ?  ? , Hepatitis -, C  ?Endo/Other  ?diabetesHypothyroidism  ? Renal/GU ?Dialysis and ESRFRenal disease  ? ?  ?Musculoskeletal ? ?(+) Arthritis ,  ? Abdominal ?  ?Peds ? Hematology ?  ?Anesthesia Other Findings ? ? Reproductive/Obstetrics ? ?  ? ? ? ? ? ? ? ? ? ? ? ? ? ?  ?  ? ? ? ? ? ? ? ?Anesthesia Physical ? ?Anesthesia Plan ? ?ASA: 4 ? ?Anesthesia Plan: General  ? ?Post-op Pain Management:   ? ?Induction: Intravenous ? ?PONV Risk Score and Plan: 3 and TIVA and Treatment may vary due to age or medical condition ? ?Airway Management Planned: Mask ? ?Additional Equipment: None ? ?Intra-op Plan:  ? ?Post-operative Plan:  ? ?Informed Consent: I have reviewed the patients History and Physical, chart, labs and discussed the procedure including the risks, benefits and alternatives for the proposed anesthesia with the patient or authorized representative who has indicated his/her understanding and acceptance.  ? ? ? ?Dental advisory given ? ?Plan Discussed with: Anesthesiologist, CRNA and Surgeon ? ?Anesthesia Plan Comments:   ? ? ? ? ? ?Anesthesia Quick Evaluation ? ?

## 2021-09-15 NOTE — Anesthesia Postprocedure Evaluation (Signed)
Anesthesia Post Note ? ?Patient: Morgan Clay ? ?Procedure(s) Performed: CARDIOVERSION ? ?  ? ?Patient location during evaluation: Endoscopy ?Anesthesia Type: General ?Level of consciousness: sedated ?Pain management: pain level controlled ?Vital Signs Assessment: post-procedure vital signs reviewed and stable ?Respiratory status: spontaneous breathing and respiratory function stable ?Cardiovascular status: stable ?Postop Assessment: no apparent nausea or vomiting ?Anesthetic complications: no ? ? ?No notable events documented. ? ?Last Vitals:  ?Vitals:  ? 09/15/21 1450 09/15/21 1455  ?BP: (!) 119/44 (!) 141/52  ?Pulse: 87 87  ?Resp: 19 16  ?Temp:    ?SpO2: (P) 96% 99%  ?  ?Last Pain:  ?Vitals:  ? 09/15/21 1450  ?TempSrc:   ?PainSc: 0-No pain  ? ? ?  ?  ?  ?  ?  ?  ? ?Georgetta Crafton DANIEL ? ? ? ? ?

## 2021-09-15 NOTE — Transfer of Care (Signed)
Immediate Anesthesia Transfer of Care Note ? ?Patient: Morgan Clay ? ?Procedure(s) Performed: CARDIOVERSION ? ?Patient Location: Endoscopy Unit ? ?Anesthesia Type:General ? ?Level of Consciousness: awake, drowsy and patient cooperative ? ?Airway & Oxygen Therapy: Patient Spontanous Breathing and Patient connected to nasal cannula oxygen ? ?Post-op Assessment: Report given to RN, Post -op Vital signs reviewed and stable and Patient moving all extremities X 4 ? ?Post vital signs: Reviewed and stable ? ?Last Vitals:  ?Vitals Value Taken Time  ?BP    ?Temp    ?Pulse    ?Resp    ?SpO2    ? ? ?Last Pain:  ?Vitals:  ? 09/15/21 1312  ?TempSrc: Temporal  ?   ? ?  ? ?Complications: No notable events documented. ?

## 2021-09-16 ENCOUNTER — Other Ambulatory Visit: Payer: Self-pay | Admitting: Cardiology

## 2021-09-16 ENCOUNTER — Encounter (HOSPITAL_COMMUNITY): Payer: Self-pay | Admitting: Cardiology

## 2021-09-20 ENCOUNTER — Ambulatory Visit: Payer: Medicare Other | Admitting: Cardiology

## 2021-09-23 ENCOUNTER — Telehealth: Payer: Self-pay

## 2021-09-23 NOTE — Telephone Encounter (Signed)
Volunteer check in call for palliative care, no answer 

## 2021-09-27 ENCOUNTER — Telehealth: Payer: Self-pay

## 2021-09-27 NOTE — Telephone Encounter (Signed)
Patient has been notified and aware.  

## 2021-09-27 NOTE — Telephone Encounter (Signed)
-----   Message from Horris Latino, [place] sent at 09/27/2021  9:16 AM EDT ----- Regarding: FW: Anticoag Tx FYI from [place] ----- Message ----- From: Loralie Champagne, PA-C Sent: 09/27/2021   8:43 AM EDT To: Horris Latino, CMA Subject: FW: Anticoag Tx                                Not sure if you were with me this day.  If not, can you forward this to whoever was?  ----- Message ----- From: [name], MD Sent: 09/25/2021  12:03 PM EDT To: Loralie Champagne, PA-C Subject: FW: Anticoag Tx                                Jess,  Please share with CMA responsible  August Luz ----- Message ----- From: [name], MD Sent: 09/22/2021   4:05 PM EDT To: [name], MD Subject: RE: Anticoag Tx                                Glendell Docker,  Think plan could be for her to continue Eliquis up until you are planning the EGD then stop it for good.  I do not think she is going to be able to take it long-term.  [place] ----- Message ----- From: [name], MD Sent: 09/22/2021   1:19 PM EDT To: [name], MD Subject: [place] Tx                                    Hi [place],  She is on for EGD in July  Will she still be on Eliquis then and if so can we hold x 2 days?  Thanks ' Glendell Docker

## 2021-09-29 ENCOUNTER — Ambulatory Visit (INDEPENDENT_AMBULATORY_CARE_PROVIDER_SITE_OTHER): Payer: Medicare Other | Admitting: Internal Medicine

## 2021-09-29 ENCOUNTER — Encounter: Payer: Self-pay | Admitting: Internal Medicine

## 2021-09-29 VITALS — BP 100/56 | HR 112 | Ht 59.0 in | Wt 126.0 lb

## 2021-09-29 DIAGNOSIS — I428 Other cardiomyopathies: Secondary | ICD-10-CM

## 2021-09-29 DIAGNOSIS — I5032 Chronic diastolic (congestive) heart failure: Secondary | ICD-10-CM

## 2021-09-29 DIAGNOSIS — Z95810 Presence of automatic (implantable) cardiac defibrillator: Secondary | ICD-10-CM | POA: Diagnosis not present

## 2021-09-29 DIAGNOSIS — I484 Atypical atrial flutter: Secondary | ICD-10-CM | POA: Diagnosis not present

## 2021-09-29 NOTE — Patient Instructions (Signed)
Medication Instructions:  Your physician recommends that you continue on your current medications as directed. Please refer to the Current Medication list given to you today.  *If you need a refill on your cardiac medications before your next appointment, please call your pharmacy*   Lab Work: None ordered.  If you have labs (blood work) drawn today and your tests are completely normal, you will receive your results only by: [place] (if you have MyChart) OR A paper copy in the mail If you have any lab test that is abnormal or we need to change your treatment, we will call you to review the results.   Testing/Procedures: None ordered.    Follow-Up: At [facility], you and your health needs are our priority.  As part of our continuing mission to provide you with exceptional heart care, we have created designated Provider Care Teams.  These Care Teams include your primary Cardiologist (physician) and Advanced Practice Providers (APPs -  Physician Assistants and Nurse Practitioners) who all work together to provide you with the care you need, when you need it.  We recommend signing up for the patient portal called "MyChart".  Sign up information is provided on this After Visit Summary.  MyChart is used to connect with patients for Virtual Visits (Telemedicine).  Patients are able to view lab/test results, encounter notes, upcoming appointments, etc.  Non-urgent messages can be sent to your provider as well.   To learn more about what you can do with MyChart, go to [email or web address].    Your next appointment:   Follow up to be determined based on when your pacemaker needs replacement  Important Information About [place]

## 2021-09-29 NOTE — Progress Notes (Signed)
Unremarkable started been a short week     Patient Care Team: Saguier, [name] as PCP - General (Internal Medicine) [name], MD as Consulting Physician (Nephrology) [name], MD as Consulting Physician (Cardiology) [name], MD as Consulting Physician (Gastroenterology) Center, [facility]   HPI  Morgan Clay is a 76 y.o. female Seen in followup for CRT-D implanted for nonischemic cardiomyopathy. This is undertaken 2008 with generator change 2012 and again in May 2017; end-stage renal disease on hemodialysis  She has significant improvement from her CRT-D   She has a history of atypical atrial flutter that has been associated with functional deterioration. She underwent cardioversion  8/16 and has been treated with apixaban.     Persistent atrial arrhythmias.  Underwent DCCV 3/19  Repeat cardioversion x2 in the last 4 months and is held very poorly and has been in a persistent atrial tachycardia for most of those months.  Symptomatic with palpitations and some shortness of breath  Also complaining that her defibrillator flips on its side  Patient denies symptoms of respiratory, GI intolerance, sun sensitivity, neurological symptoms attributable to amiodarone.      TSH elevated 4/23; she has been on levothyroxine 137 for a long time; she says that they are giving her another thyroid pill at dialysis.  Date Cr K Hgb LFTs TSH  1/19 1.76 4.3 14.7   0.33  4/19  1.96 4.2 9.7    5/23 5.2  13.3  40      DATE TEST EF   4/15 Echo 55-60 %   9/17 Echo 45-50 % LAE (52/3.1/35)  10/18 Echo  45-50% LVH mod  4/19 Echo  55-65%   2/23 Echo  55-60% LAE  3/23 TEE  AI mod-severe      Past Medical History:  Diagnosis Date   Acute blood loss anemia 09/24/2020   AICD (automatic cardioverter/defibrillator) present 10/06/2015   Anemia due to chronic blood loss 04/15/2020   Anemia in chronic kidney disease 01/18/2018   Angiodysplasia of small  intestine 06/10/2020   Ileum - seen on capsule endoscopy 05/2020 - ablated at colonoscopy   Aortic atherosclerosis ([place]) 08/30/2017   Arthritis    "qwhere" (01/03/2018)   Asthma    reports mild asthma since childhood - had COPD on dx list from prior PCP   Atrial fibrillation with RVR ([place]) 11/17/2020   Atrial fibrillation, chronic 10/25/2017   Atypical atrial flutter ([place]) 09/28/2015   AVM (arteriovenous malformation) of small bowel, acquired 01/14/2018   Biventricular ICD (implantable cardioverter-defibrillator) in place 01/08/2007   Qualifier: Diagnosis of  By: Hassell Done FNP, Nykedtra     Bleeding pseudoaneurysm of left brachiocephalic arteriovenous fistula ([place]) 09/24/2020   Bleeding pseudoaneurysm of left brachiocephalic AV fistula ([place]) 09/24/2020   Carpal tunnel syndrome, bilateral 06/09/2021   Chronic bronchitis ([place])    "get it most years; not this past year though" (01/03/2018)   Chronic diastolic CHF (congestive heart failure) ([place]) 04/24/2007   Annotation: secondary to nonischemic cardiomyopathy s/p CRT-D Followed by Dr. Caryl Comes in Cardiology Qualifier: Diagnosis of  By: Hassell Done FNP, Nykedtra     Chronic obstructive pulmonary disease ([place])    Chronic systolic congestive heart failure ([place])    CKD (chronic kidney disease) stage 4, GFR 15-29 ml/min  11/24/2013   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 (HCV) ([place])    HEPATITIS C - s/p treatment with Harvoni, saw hepatology, Dawn Drazek   COVID-19 virus infection 05/27/2020   Diabetes mellitus without complication ([place])  DIET CONTROLLED    Dialysis complication    Diverticulitis of left colon status post robotic low anterior to sigmoid resection 05/22/2018 07/31/2017   End stage renal disease ([place]) 03/22/2021   FIBROIDS, UTERUS 03/05/2008   Glaucoma    Gout    History of blood transfusion ~ 11/2017   Hypertension associated with diabetes ([place]) 06/07/2017   Hypothyroidism    LBBB (left bundle branch block)    Lesion of  ulnar nerve 06/09/2021   Long term current use of anticoagulant    Lower GI bleeding    "been dealing w/it since 07/2017" (01/03/2018)   Malnutrition of moderate degree 07/09/2020   MDD (major depressive disorder), recurrent, in partial remission ([place]) 06/07/2017   Nonischemic cardiomyopathy ([place])    Obesity 05/27/2009   Qualifier: Diagnosis of  By: Hassell Done FNP, Nykedtra     OBSTRUCTIVE SLEEP APNEA 11/14/2007   no CPAP   OSTEOPENIA 09/30/2008   Other cirrhosis of liver ([place]) 08/12/2020   Formatting of this note is different from the original. Patient with history of cirrhosis secondary to hepatitis C with no history of hepatic decompensation.  She has had some hyponatremia and hypoalbuminemia in the past however most recent labs show relatively well-preserved liver synthetic function.  She will have labs to update liver synthetic function.  Hepatoma screening - patient is currentl   Overweight (BMI 25.0-29.9) 03/02/2020   Pain in right knee 05/13/2018   Persistent atrial fibrillation ([place]) 02/06/2020   Pneumonia    "several times" (01/03/2018)   Polyneuropathy associated with underlying disease ([place]) 06/09/2021   Pulmonary hypertension ([place]) on echocardiogram 01/14/2018   Secondary esophageal varices without bleeding ([place]) 12/23/2020   Formatting of this note might be different from the original. Patient had trace esophageal varices during small bowel enteroscopy in October 2021, follow-up EGD in January 2022 with no varices.  She previously had GI bleeding thought to be from AVM versus GAVE.  She is no longer on carvedilol and will need surveillance endoscopy.  Patient was previously followed at [facility] and I have asked her    Secondary hypercoagulable state ([place]) 02/06/2020   Type 2 diabetes, controlled, with renal manifestation ([place]) 11/24/2013    Past Surgical History:  Procedure Laterality Date   A/V FISTULAGRAM N/A 03/28/2021   Procedure: A/V FISTULAGRAM;  Surgeon: Waynetta Sandy, MD;  Location: [place] [facility];  Service: Cardiovascular;  Laterality: N/A;   APPENDECTOMY     AV FISTULA PLACEMENT Left 08/05/2020   Procedure: LEFT UPPER EXTREMITY ARTERIOVENOUS (AV) FISTULA CREATION;  Surgeon: Cherre Robins, MD;  Location: [place];  Service: Vascular;  Laterality: Left;   [place] Left 09/16/2020   Procedure: LEFT SECOND STAGE [place];  Surgeon: Cherre Robins, MD;  Location: [place];  Service: Vascular;  Laterality: Left;  PERIPHERAL NERVE BLOCK   CARDIAC CATHETERIZATION     CARDIOVERSION N/A 12/14/2014   Procedure: CARDIOVERSION;  Surgeon: Dorothy Spark, MD;  Location: [place];  Service: Cardiovascular;  Laterality: N/A;   CARDIOVERSION N/A 02/26/2017   Procedure: CARDIOVERSION;  Surgeon: Evans Lance, MD;  Location: [place] [facility];  Service: Cardiovascular;  Laterality: N/A;   CARDIOVERSION N/A 07/24/2017   Procedure: CARDIOVERSION;  Surgeon: Acie Fredrickson Wonda Cheng, MD;  Location: [facility] ENDOSCOPY;  Service: Cardiovascular;  Laterality: N/A;   CARDIOVERSION N/A 02/12/2020   Procedure: CARDIOVERSION;  Surgeon: [name], MD;  Location: [place];  Service: Cardiovascular;  Laterality: N/A;   CARDIOVERSION  N/A 09/15/2021   Procedure: CARDIOVERSION;  Surgeon: [name], MD;  Location: [facility] ENDOSCOPY;  Service: Cardiovascular;  Laterality: N/A;   CARPAL TUNNEL RELEASE  07/21/2021   COLONOSCOPY N/A 11/08/2017   Procedure: COLONOSCOPY;  Surgeon: Milus Banister, MD;  Location: [facility]OSCOPY;  Service: Endoscopy;  Laterality: N/A;   COLONOSCOPY WITH PROPOFOL N/A 05/31/2020   Procedure: COLONOSCOPY WITH PROPOFOL;  Surgeon: Irene Shipper, MD;  Location: [facility] ENDOSCOPY;  Service: Endoscopy;  Laterality: N/A;   DILATION AND CURETTAGE OF UTERUS     ENTEROSCOPY N/A 03/02/2020   Procedure: ENTEROSCOPY;  Surgeon: Yetta Flock, MD;  Location: [facility] ENDOSCOPY;  Service: Gastroenterology;  Laterality: N/A;    EP IMPLANTABLE DEVICE N/A 10/06/2015   Procedure: BIV ICD Generator Changeout;  Surgeon: [name], MD;  Location: [place] [facility];  Service: Cardiovascular;  Laterality: N/A;   ESOPHAGOGASTRODUODENOSCOPY N/A 10/26/2017   Procedure: ESOPHAGOGASTRODUODENOSCOPY (EGD);  Surgeon: Ladene Artist, MD;  Location: [facility];  Service: Endoscopy;  Laterality: N/A;   ESOPHAGOGASTRODUODENOSCOPY (EGD) WITH PROPOFOL N/A 11/07/2017   Procedure: ESOPHAGOGASTRODUODENOSCOPY (EGD) WITH PROPOFOL;  Surgeon: Milus Banister, MD;  [facility];  Service: Endoscopy;  Laterality: N/A;   ESOPHAGOGASTRODUODENOSCOPY (EGD) WITH PROPOFOL N/A 05/29/2020   Procedure: ESOPHAGOGASTRODUODENOSCOPY (EGD) WITH PROPOFOL;  Surgeon: Doran Stabler, MD;  Location: [place];  Service: Gastroenterology;  Laterality: N/A;   GIVENS CAPSULE STUDY N/A 05/19/2020   Procedure: GIVENS CAPSULE STUDY;  Surgeon: [name], MD;  Location: [place];  Service: Endoscopy;  Laterality: N/A;  .adm for obs since pacemaker, PA wil enter order and see pt   GIVENS CAPSULE STUDY N/A 05/29/2020   Procedure: GIVENS CAPSULE STUDY;  Surgeon: Doran Stabler, MD;  Location: [place];  Service: Gastroenterology;  Laterality: N/A;   HEMATOMA EVACUATION Left 09/24/2020   Procedure: EVACUATION HEMATOMA ARM;  Surgeon: Rosetta Posner, MD;  Location: [facility] OR;  Service: Vascular;  Laterality: Left;   HOT HEMOSTASIS N/A 10/26/2017   Procedure: HOT HEMOSTASIS (ARGON PLASMA COAGULATION/BICAP);  Surgeon: Ladene Artist, MD;  Location: [facility];  Service: Endoscopy;  Laterality: N/A;   HOT HEMOSTASIS N/A 03/02/2020   Procedure: HOT HEMOSTASIS (ARGON PLASMA COAGULATION/BICAP);  Surgeon: Yetta Flock, MD;  Location: [facility] ENDOSCOPY;  Service: Gastroenterology;  Laterality: N/A;   HOT HEMOSTASIS N/A 05/31/2020   Procedure: HOT HEMOSTASIS (ARGON PLASMA COAGULATION/BICAP);  Surgeon: Irene Shipper, MD;  Location: [facility] ENDOSCOPY;   Service: Endoscopy;  Laterality: N/A;   INSERT / REPLACE / REMOVE PACEMAKER  ?2008   IR PERC TUN PERIT CATH WO PORT S&I /IMAG  07/05/2020   PERIPHERAL VASCULAR INTERVENTION Left 03/28/2021   Procedure: PERIPHERAL VASCULAR INTERVENTION;  Surgeon: Waynetta Sandy, MD;  Location: [place] [facility];  Service: Cardiovascular;  Laterality: Left;   POLYPECTOMY  11/08/2017   Procedure: POLYPECTOMY;  Surgeon: Milus Banister, MD;  Location[facility];  Service: Endoscopy;;   PROCTOSCOPY N/A 05/22/2018   Procedure: RIGID PROCTOSCOPY;  Surgeon: Michael Boston, MD;  Location: [facility];  Service: General;  Laterality: N/A;   RIGHT HEART CATH N/A 02/13/2018   Procedure: RIGHT HEART CATH;  Surgeon: [name], MD;  Location: [place] [facility];  Service: Cardiovascular;  Laterality: N/A;   RIGHT HEART CATH N/A 06/23/2020   Procedure: RIGHT HEART CATH;  Surgeon: [name], MD;  Location: [place] [facility];  Service: Cardiovascular;  Laterality: N/A;   RIGHT/LEFT HEART CATH AND CORONARY  ANGIOGRAPHY N/A 12/11/2019   Procedure: RIGHT/LEFT HEART CATH AND CORONARY ANGIOGRAPHY;  Surgeon: [name], MD;  Location: [place] [facility];  Service: Cardiovascular;  Laterality: N/A;   SUBMUCOSAL TATTOO INJECTION  03/02/2020   Procedure: SUBMUCOSAL TATTOO INJECTION;  Surgeon: Yetta Flock, MD;  Location: [facility] ENDOSCOPY;  Service: Gastroenterology;;   SUBMUCOSAL TATTOO INJECTION  05/31/2020   Procedure: SUBMUCOSAL TATTOO INJECTION;  Surgeon: Irene Shipper, MD;  Location: [place];  Service: Endoscopy;;   TEE WITHOUT CARDIOVERSION N/A 08/02/2021   Procedure: TRANSESOPHAGEAL ECHOCARDIOGRAM (TEE);  Surgeon: [name], MD;  Location: [facility] ENDOSCOPY;  Service: Cardiovascular;  Laterality: N/A;   TUBAL LIGATION     XI ROBOTIC ASSISTED LOWER ANTERIOR RESECTION N/A 05/22/2018   Procedure: XI ROBOTIC ASSISTED SIGMOID COLOECTOMY MOBILIZATION OF SPLENIC FLEXURE, FIREFLY ASSESSMENT OF  PERFUSION;  Surgeon: Michael Boston, MD;  Location: [facility];  Service: General;  Laterality: N/A;  ERAS PATHWAY    Current Outpatient Medications  Medication Sig Dispense Refill   amiodarone (PACERONE) 200 MG tablet Take 1 tablet (200 mg total) by mouth daily. 90 tablet 3   apixaban (ELIQUIS) 5 MG TABS tablet Take 1 tablet (5 mg total) by mouth 2 (two) times daily. 60 tablet 0   AURYXIA 1 GM 210 MG(Fe) tablet Take 420 mg by mouth 3 (three) times daily with meals.     gabapentin (NEURONTIN) 300 MG capsule Take 1 capsule (300 mg total) by mouth daily. 180 capsule 2   isosorbide-hydrALAZINE (BIDIL) 20-37.5 MG tablet Take 0.5 tablets by mouth 3 (three) times daily. 126 tablet 5   levothyroxine (SYNTHROID) 137 MCG tablet Take 1 tablet (137 mcg total) by mouth daily before breakfast. 90 tablet 3   lidocaine-prilocaine (EMLA) cream 1 application. 3 (three) times a week.     traZODone (DESYREL) 50 MG tablet Take 0.5-1 tablets (25-50 mg total) by mouth at bedtime as needed for sleep. 30 tablet 3   XIIDRA 5 % SOLN Place 1 drop into both eyes 2 (two) times daily.     No current facility-administered medications for this visit.    No Known Allergies  Review of Systems negative except from HPI and PMH  Physical Exam BP (!) 100/56   Pulse (!) 112   Ht '4\' 11"'$  (1.499 m)   Wt 126 lb (57.2 kg)   SpO2 97%   BMI 25.45 kg/m  Well developed and well nourished in no acute distress HENT normal Neck supple with JVP-flat Clear Device pocket well healed; without hematoma or erythema.  There is no tethering  Regular rate and rhythm, 2/6 murmur Abd-soft with active BS No Clubbing cyanosis   edema Skin-warm and dry A & Oriented  Grossly normal sensory and motor function  ECG P synchronous pacing 110   Assessment and  Plan    DiastolicSystolic heart failure chronic   Nonischemic cardiomyopathy   High Risk Medication Surveillance-amiodarone   Hypertension     CRT-D- [employer]      Atrial  Flutter/atrial tachycardia  Hypothyroidism  The patient has had persistent atrial tachycardia that has recurred rapidly following cardioversion twice in the last few months.  Symptomatic and we have reprogrammed her with an upper tracking rate of 90.  We tried VVIR pacing but this was quite symptomatic because of the asynchronous mode.  She preferred upper tracking rate of 90 to 1-1 and 110.  We will have her see Dr. Carlyn Reichert  for consideration of catheter ablation of her atrial arrhythmia  There  are no ways that we can reprogram the device to increase her upper tracking rate with activity so we will have to see how it is that she does.  Amiodarone surveillance suggests that she is hypothyroid with a TSH of 40 or so.  I have reached out to her nephrologist, Dr. Moshe Cipro, to try to get some information as to what is going on at the dialysis center with the "I do not which was I think I think with that would not go to the cold rainy like will you can go you probably will not have any compensation for the chairs he can sit where you want extra thyroid pill "   Approaching ERI.  Given her high LV outputs, will change to [employer] to avoid the doubler change out 1

## 2021-09-30 ENCOUNTER — Telehealth: Payer: Self-pay | Admitting: Medical

## 2021-09-30 NOTE — Telephone Encounter (Signed)
Will you get pt scheduled for follow up. Labs done by cardiologist one month ago showed abnormal thyroid studies. Want to recheck pt and repeat labs before adjusting dose of med.

## 2021-10-04 NOTE — Telephone Encounter (Signed)
Pt scheduled for thursday

## 2021-10-06 ENCOUNTER — Ambulatory Visit (INDEPENDENT_AMBULATORY_CARE_PROVIDER_SITE_OTHER): Payer: Medicare Other | Admitting: Medical

## 2021-10-06 VITALS — BP 109/68 | HR 90 | Resp 18 | Ht 59.0 in | Wt 126.0 lb

## 2021-10-06 DIAGNOSIS — E039 Hypothyroidism, unspecified: Secondary | ICD-10-CM

## 2021-10-06 DIAGNOSIS — N186 End stage renal disease: Secondary | ICD-10-CM

## 2021-10-06 DIAGNOSIS — D649 Anemia, unspecified: Secondary | ICD-10-CM

## 2021-10-06 DIAGNOSIS — I5032 Chronic diastolic (congestive) heart failure: Secondary | ICD-10-CM | POA: Diagnosis not present

## 2021-10-06 DIAGNOSIS — I482 Chronic atrial fibrillation, unspecified: Secondary | ICD-10-CM

## 2021-10-06 DIAGNOSIS — R739 Hyperglycemia, unspecified: Secondary | ICD-10-CM | POA: Diagnosis not present

## 2021-10-06 LAB — TSH: TSH: 4.89 u[IU]/mL (ref 0.35–5.50)

## 2021-10-06 LAB — CBC WITH DIFFERENTIAL/PLATELET
Basophils Absolute: 0 10*3/uL (ref 0.0–0.1)
Basophils Relative: 0.6 % (ref 0.0–3.0)
Eosinophils Absolute: 0.2 10*3/uL (ref 0.0–0.7)
Eosinophils Relative: 4 % (ref 0.0–5.0)
HCT: 30.2 % — ABNORMAL LOW (ref 36.0–46.0)
Hemoglobin: 9.8 g/dL — ABNORMAL LOW (ref 12.0–15.0)
Lymphocytes Relative: 24.8 % (ref 12.0–46.0)
Lymphs Abs: 1.4 10*3/uL (ref 0.7–4.0)
MCHC: 32.6 g/dL (ref 30.0–36.0)
MCV: 96.2 fl (ref 78.0–100.0)
Monocytes Absolute: 0.6 10*3/uL (ref 0.1–1.0)
Monocytes Relative: 10 % (ref 3.0–12.0)
Neutro Abs: 3.4 10*3/uL (ref 1.4–7.7)
Neutrophils Relative %: 60.6 % (ref 43.0–77.0)
Platelets: 165 10*3/uL (ref 150.0–400.0)
RBC: 3.14 Mil/uL — ABNORMAL LOW (ref 3.87–5.11)
RDW: 18.2 % — ABNORMAL HIGH (ref 11.5–15.5)
WBC: 5.6 10*3/uL (ref 4.0–10.5)

## 2021-10-06 LAB — T4, FREE: Free T4: 1.83 ng/dL — ABNORMAL HIGH (ref 0.60–1.60)

## 2021-10-06 LAB — HEMOGLOBIN A1C: Hgb A1c MFr Bld: 4.9 % (ref 4.6–6.5)

## 2021-10-06 MED ORDER — DIAZEPAM 5 MG PO TABS: ORAL_TABLET | ORAL | 0 refills | Status: DC

## 2021-10-06 NOTE — Progress Notes (Signed)
Subjective:    Patient ID: Morgan Clay, female    DOB: August 15, 1945, 76 y.o.   MRN: [id number]  HPI  Pt in for follow up.   Pt states still has restless legs. Treated with gabapentin. Also nephrologist at dialysis center gave her med but did not help.  Pt has some recent anxiety worse recently. She states years with anxiety but never mentioned.   Atrial fibrillation and chf. Rate controlled and on eliquis.  Pt saw Dr. Caryl Comes on 09/29/21  Hypertension     CRT-D- [employer]      Atrial Flutter/atrial tachycardia   Hypothyroidism   The patient has had persistent atrial tachycardia that has recurred rapidly following cardioversion twice in the last few months.  Symptomatic and we have reprogrammed her with an upper tracking rate of 90.  We tried VVIR pacing but this was quite symptomatic because of the asynchronous mode.  She preferred upper tracking rate of 90 to 1-1 and 110.   We will have her see Dr. Carlyn Reichert  for consideration of catheter ablation of her atrial arrhythmia  There are no ways that we can reprogram the device to increase her upper tracking rate with activity so we will have to see how it is that she does.  Amiodarone surveillance suggests that she is hypothyroid with a TSH of 40 or so.  I have reached out to her nephrologist, Dr. Moshe Cipro, to try to get some information as to what is going on at the dialysis center with the "I do not which was I think I think with that would not go to the cold rainy like will you can go you probably will not have any compensation for the chairs he can sit where you want extra thyroid pill "  Pt does dialysis- some days after dialysis bp is low. No dizziness on standing or falls.  Anemia- last lab one month agol hb/hct 10.6/31.4. Pt declines labs today. She states nephroloigst office would have checked.  Tsh elevated at 40. T4 was normal. Pt states nephrologist office added med to her levothyroxine. Pt sees Dr. Lyda Kalata with [facility].   Review of Systems  Constitutional:  Negative for chills, fatigue and fever.  Respiratory:  Negative for chest tightness, shortness of breath and wheezing.   Cardiovascular:  Negative for chest pain and palpitations.  Gastrointestinal:  Negative for abdominal pain.  Musculoskeletal:  Negative for back pain and myalgias.  Skin:  Negative for rash.  Neurological:  Negative for dizziness, light-headedness, numbness and headaches.  Hematological:  Negative for adenopathy. Does not bruise/bleed easily.  Psychiatric/Behavioral:  Negative for behavioral problems, confusion and dysphoric mood. The patient is nervous/anxious.     Past Medical History:  Diagnosis Date   Acute blood loss anemia 09/24/2020   AICD (automatic cardioverter/defibrillator) present 10/06/2015   Anemia due to chronic blood loss 04/15/2020   Anemia in chronic kidney disease 01/18/2018   Angiodysplasia of small intestine 06/10/2020   Ileum - seen on capsule endoscopy 05/2020 - ablated at colonoscopy   Aortic atherosclerosis ([place]) 08/30/2017   Arthritis    "qwhere" (01/03/2018)   Asthma    reports mild asthma since childhood - had COPD on dx list from prior PCP   Atrial fibrillation with RVR ([place]) 11/17/2020   Atrial fibrillation, chronic 10/25/2017   Atypical atrial flutter ([place]) 09/28/2015   AVM (arteriovenous malformation) of small bowel, acquired 01/14/2018   Biventricular ICD (implantable cardioverter-defibrillator) in place 01/08/2007   Qualifier: Diagnosis of  By:  Hassell Done FNP, Nykedtra     Bleeding pseudoaneurysm of left brachiocephalic arteriovenous fistula ([place]) 09/24/2020   Bleeding pseudoaneurysm of left brachiocephalic AV fistula ([place]) 09/24/2020   Carpal tunnel syndrome, bilateral 06/09/2021   Chronic bronchitis ([place])    "get it most years; not this past year though" (01/03/2018)   Chronic diastolic CHF (congestive heart failure) ([place]) 04/24/2007   Annotation: secondary to nonischemic  cardiomyopathy s/p CRT-D Followed by Dr. Caryl Comes in Cardiology Qualifier: Diagnosis of  By: Hassell Done FNP, Nykedtra     Chronic obstructive pulmonary disease ([place])    Chronic systolic congestive heart failure ([place])    CKD (chronic kidney disease) stage 4, GFR 15-29 ml/min  11/24/2013   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 (HCV) ([place])    HEPATITIS C - s/p treatment with Harvoni, saw hepatology, Dawn Drazek   COVID-19 virus infection 05/27/2020   Diabetes mellitus without complication ([place])    DIET CONTROLLED    Dialysis complication    Diverticulitis of left colon status post robotic low anterior to sigmoid resection 05/22/2018 07/31/2017   End stage renal disease ([place]) 03/22/2021   FIBROIDS, UTERUS 03/05/2008   Glaucoma    Gout    History of blood transfusion ~ 11/2017   Hypertension associated with diabetes ([place]) 06/07/2017   Hypothyroidism    LBBB (left bundle branch block)    Lesion of ulnar nerve 06/09/2021   Long term current use of anticoagulant    Lower GI bleeding    "been dealing w/it since 07/2017" (01/03/2018)   Malnutrition of moderate degree 07/09/2020   MDD (major depressive disorder), recurrent, in partial remission ([place]) 06/07/2017   Nonischemic cardiomyopathy ([place])    Obesity 05/27/2009   Qualifier: Diagnosis of  By: Hassell Done FNP, Nykedtra     OBSTRUCTIVE SLEEP APNEA 11/14/2007   no CPAP   OSTEOPENIA 09/30/2008   Other cirrhosis of liver ([place]) 08/12/2020   Formatting of this note is different from the original. Patient with history of cirrhosis secondary to hepatitis C with no history of hepatic decompensation.  She has had some hyponatremia and hypoalbuminemia in the past however most recent labs show relatively well-preserved liver synthetic function.  She will have labs to update liver synthetic function.  Hepatoma screening - patient is currentl   Overweight (BMI 25.0-29.9) 03/02/2020   Pain in right knee 05/13/2018   Persistent atrial fibrillation ([place])  02/06/2020   Pneumonia    "several times" (01/03/2018)   Polyneuropathy associated with underlying disease ([place]) 06/09/2021   Pulmonary hypertension ([place]) on echocardiogram 01/14/2018   Secondary esophageal varices without bleeding ([place]) 12/23/2020   Formatting of this note might be different from the original. Patient had trace esophageal varices during small bowel enteroscopy in October 2021, follow-up EGD in January 2022 with no varices.  She previously had GI bleeding thought to be from AVM versus GAVE.  She is no longer on carvedilol and will need surveillance endoscopy.  Patient was previously followed at [facility] and I have asked her    Secondary hypercoagulable state ([place]) 02/06/2020   Type 2 diabetes, controlled, with renal manifestation ([place]) 11/24/2013     Social History   Socioeconomic History   Marital status: Divorced    Spouse name: Not on file   Number of children: 1   Years of education: 12   Highest education level: Not on file  Occupational History   Not on file  Tobacco Use   Smoking status: Never   Smokeless tobacco: Never  Vaping  Use   Vaping Use: Never used  Substance and Sexual Activity   Alcohol use: Not Currently    Comment: 01/03/2018 "couple drinks/month"   Drug use: Not Currently    Types: Marijuana    Comment: VERY INTERMITTENT    Sexual activity: Not Currently  Other Topics Concern   Not on file  Social History Narrative   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      Home Situation: lives alone      Spiritual Beliefs: Christian - Baptist      Lifestyle: no regular exercise; poor diet      She is divorced at least one adult child   Never smoker    rare alcohol to occasional at best    no substance abuse         Social Determinants of Radio broadcast assistant Strain: Low Risk    Difficulty of Paying Living Expenses: Not hard at all  Food Insecurity: No Food Insecurity   Worried About Charity fundraiser in the Last Year: Never  true   [place] in the Last Year: Never true  Transportation Needs: No Transportation Needs   Lack of Transportation (Medical): No   Lack of Transportation (Non-Medical): No  Physical Activity: Not on file  Stress: No Stress Concern Present   Feeling of Stress : Not at all  Social Connections: Moderately Isolated   Frequency of Communication with Friends and Family: More than three times a week   Frequency of Social Gatherings with Friends and Family: Three times a week   Attends Religious Services: More than 4 times per year   Active Member of Clubs or Organizations: No   Attends Archivist Meetings: Never   Marital Status: Divorced  Human resources officer Violence: Not At Risk   Fear of Current or Ex-Partner: No   Emotionally Abused: No   Physically Abused: No   Sexually Abused: No    Past Surgical History:  Procedure Laterality Date   A/V FISTULAGRAM N/A 03/28/2021   Procedure: A/V FISTULAGRAM;  Surgeon: Waynetta Sandy, MD;  Location: [facility];  Service: Cardiovascular;  Laterality: N/A;   APPENDECTOMY     AV FISTULA PLACEMENT Left 08/05/2020   Procedure: LEFT UPPER EXTREMITY ARTERIOVENOUS (AV) FISTULA CREATION;  Surgeon: Cherre Robins, MD;  Location: [place];  Service: Vascular;  Laterality: Left;   [place] Left 09/16/2020   Procedure: LEFT SECOND STAGE [place];  Surgeon: Cherre Robins, MD;  Location: [facility];  Service: Vascular;  Laterality: Left;  PERIPHERAL NERVE BLOCK   CARDIAC CATHETERIZATION     CARDIOVERSION N/A 12/14/2014   Procedure: CARDIOVERSION;  Surgeon: Dorothy Spark, MD;  Location: [facility];  Service: Cardiovascular;  Laterality: N/A;   CARDIOVERSION N/A 02/26/2017   Procedure: CARDIOVERSION;  Surgeon: Evans Lance, MD;  Location: [facility];  Service: Cardiovascular;  Laterality: N/A;   CARDIOVERSION N/A 07/24/2017   Procedure: CARDIOVERSION;  Surgeon: Acie Fredrickson Wonda Cheng, MD;  Location: [facility] ENDOSCOPY;  Service: Cardiovascular;  Laterality: N/A;   CARDIOVERSION N/A 02/12/2020   Procedure: CARDIOVERSION;  Surgeon: [name], MD;  Location: [facility] ENDOSCOPY;  Service: Cardiovascular;  Laterality: N/A;   CARDIOVERSION N/A 09/15/2021   Procedure: CARDIOVERSION;  Surgeon: [name], MD;  Location: [facility] ENDOSCOPY;  Service: Cardiovascular;  Laterality: N/A;   CARPAL TUNNEL RELEASE  07/21/2021   COLONOSCOPY N/A 11/08/2017   Procedure: COLONOSCOPY;  Surgeon:  Milus Banister, MD;  Location: [facility];  Service: Endoscopy;  Laterality: N/A;   COLONOSCOPY WITH PROPOFOL N/A 05/31/2020   Procedure: COLONOSCOPY WITH PROPOFOL;  Surgeon: Irene Shipper, MD;  Location: [facility] ENDOSCOPY;  Service: Endoscopy;  Laterality: N/A;   DILATION AND CURETTAGE OF UTERUS     ENTEROSCOPY N/A 03/02/2020   Procedure: ENTEROSCOPY;  Surgeon: Yetta Flock, MD;  Location: [facility] ENDOSCOPY;  Service: Gastroenterology;  Laterality: N/A;   EP IMPLANTABLE DEVICE N/A 10/06/2015   Procedure: BIV ICD Generator Changeout;  Surgeon: [name], MD;  Location: [place] [facility];  Service: Cardiovascular;  Laterality: N/A;   ESOPHAGOGASTRODUODENOSCOPY N/A 10/26/2017   Procedure: ESOPHAGOGASTRODUODENOSCOPY (EGD);  Surgeon: Ladene Artist, MD;  Location: [facility];  Service: Endoscopy;  Laterality: N/A;   ESOPHAGOGASTRODUODENOSCOPY (EGD) WITH PROPOFOL N/A 11/07/2017   Procedure: ESOPHAGOGASTRODUODENOSCOPY (EGD) WITH PROPOFOL;  Surgeon: Milus Banister, MD;  Location: [facility] ENDOSCOPY;  Service: Endoscopy;  Laterality: N/A;   ESOPHAGOGASTRODUODENOSCOPY (EGD) WITH PROPOFOL N/A 05/29/2020   Procedure: ESOPHAGOGASTRODUODENOSCOPY (EGD) WITH PROPOFOL;  Surgeon: Doran Stabler, MD;  Location: [facility];  Service: Gastroenterology;  Laterality: N/A;   GIVENS CAPSULE STUDY N/A 05/19/2020   Procedure: GIVENS CAPSULE STUDY;  Surgeon: [name], MD;  Location: [facility];  Service:  Endoscopy;  Laterality: N/A;  .adm for obs since pacemaker, PA wil enter order and see pt   GIVENS CAPSULE STUDY N/A 05/29/2020   Procedure: GIVENS CAPSULE STUDY;  Surgeon: Doran Stabler, MD;  Location: [place];  Service: Gastroenterology;  Laterality: N/A;   HEMATOMA EVACUATION Left 09/24/2020   Procedure: EVACUATION HEMATOMA ARM;  Surgeon: Rosetta Posner, MD;  Location: [facility] OR;  Service: Vascular;  Laterality: Left;   HOT HEMOSTASIS N/A 10/26/2017   Procedure: HOT HEMOSTASIS (ARGON PLASMA COAGULATION/BICAP);  Surgeon: Ladene Artist, MD;  Location: [facility];  Service: Endoscopy;  Laterality: N/A;   HOT HEMOSTASIS N/A 03/02/2020   Procedure: HOT HEMOSTASIS (ARGON PLASMA COAGULATION/BICAP);  Surgeon: Yetta Flock, MD;  Location: [facility] ENDOSCOPY;  Service: Gastroenterology;  Laterality: N/A;   HOT HEMOSTASIS N/A 05/31/2020   Procedure: HOT HEMOSTASIS (ARGON PLASMA COAGULATION/BICAP);  Surgeon: Irene Shipper, MD;  Location: [facility] ENDOSCOPY;  Service: Endoscopy;  Laterality: N/A;   INSERT / REPLACE / REMOVE PACEMAKER  ?2008   IR PERC TUN PERIT CATH WO PORT S&I /IMAG  07/05/2020   PERIPHERAL VASCULAR INTERVENTION Left 03/28/2021   Procedure: PERIPHERAL VASCULAR INTERVENTION;  Surgeon: Waynetta Sandy, MD;  Location: [place] [facility];  Service: Cardiovascular;  Laterality: Left;   POLYPECTOMY  11/08/2017   Procedure: POLYPECTOMY;  Surgeon: Milus Banister, MD;  Location: [facility];  Service: Endoscopy;;   PROCTOSCOPY N/A 05/22/2018   Procedure: RIGID PROCTOSCOPY;  Surgeon: Michael Boston, MD;  Location: [facility];  Service: General;  Laterality: N/A;   RIGHT HEART CATH N/A 02/13/2018   Procedure: RIGHT HEART CATH;  Surgeon: [name], MD;  Location: [place] [facility];  Service: Cardiovascular;  Laterality: N/A;   RIGHT HEART CATH N/A 06/23/2020   Procedure: RIGHT HEART CATH;  Surgeon: [name], MD;  Location: [place] [facility];  Service: Cardiovascular;   Laterality: N/A;   RIGHT/LEFT HEART CATH AND CORONARY ANGIOGRAPHY N/A 12/11/2019   Procedure: RIGHT/LEFT HEART CATH AND CORONARY ANGIOGRAPHY;  Surgeon: [name], MD;  Location: [place] [facility];  Service: Cardiovascular;  Laterality: N/A;   SUBMUCOSAL TATTOO INJECTION  03/02/2020   Procedure:  SUBMUCOSAL TATTOO INJECTION;  Surgeon: Yetta Flock, MD;  Location: [facility] ENDOSCOPY;  Service: Gastroenterology;;   SUBMUCOSAL TATTOO INJECTION  05/31/2020   Procedure: SUBMUCOSAL TATTOO INJECTION;  Surgeon: Irene Shipper, MD;  Location: [place];  Service: Endoscopy;;   TEE WITHOUT CARDIOVERSION N/A 08/02/2021   Procedure: TRANSESOPHAGEAL ECHOCARDIOGRAM (TEE);  Surgeon: [name], MD;  Location: [facility] ENDOSCOPY;  Service: Cardiovascular;  Laterality: N/A;   TUBAL LIGATION     XI ROBOTIC ASSISTED LOWER ANTERIOR RESECTION N/A 05/22/2018   Procedure: XI ROBOTIC ASSISTED SIGMOID COLOECTOMY MOBILIZATION OF SPLENIC FLEXURE, FIREFLY ASSESSMENT OF PERFUSION;  Surgeon: Michael Boston, MD;  Location: [facility];  Service: General;  Laterality: N/A;  ERAS PATHWAY    Family History  Problem Relation Age of Onset   Asthma Father    Heart attack Father    Asthma Sister    Lung cancer Sister    Heart attack Mother    Stroke Brother    Colon cancer Neg Hx    Esophageal cancer Neg Hx    Pancreatic cancer Neg Hx    Stomach cancer Neg Hx    Liver disease Neg Hx     No Known Allergies  Current Outpatient Medications on File Prior to Visit  Medication Sig Dispense Refill   amiodarone (PACERONE) 200 MG tablet Take 1 tablet (200 mg total) by mouth daily. 90 tablet 3   apixaban (ELIQUIS) 5 MG TABS tablet Take 1 tablet (5 mg total) by mouth 2 (two) times daily. 60 tablet 0   AURYXIA 1 GM 210 MG(Fe) tablet Take 420 mg by mouth 3 (three) times daily with meals.     gabapentin (NEURONTIN) 300 MG capsule Take 1 capsule (300 mg total) by mouth daily. 180 capsule 2   isosorbide-hydrALAZINE (BIDIL)  20-37.5 MG tablet Take 0.5 tablets by mouth 3 (three) times daily. 126 tablet 5   levothyroxine (SYNTHROID) 137 MCG tablet Take 1 tablet (137 mcg total) by mouth daily before breakfast. 90 tablet 3   lidocaine-prilocaine (EMLA) cream 1 application. 3 (three) times a week.     traZODone (DESYREL) 50 MG tablet Take 0.5-1 tablets (25-50 mg total) by mouth at bedtime as needed for sleep. 30 tablet 3   XIIDRA 5 % SOLN Place 1 drop into both eyes 2 (two) times daily.     No current facility-administered medications on file prior to visit.    BP 109/68   Pulse 90   Resp 18   Ht '4\' 11"'$  (1.499 m)   Wt 126 lb (57.2 kg)   SpO2 99%   BMI 25.45 kg/m       Objective:   Physical Exam  General Mental Status- Alert. General Appearance- Not in acute distress.   Skin General: Color- Normal Color. Moisture- Normal Moisture.  Neck Carotid Arteries- Normal color. Moisture- Normal Moisture. No carotid bruits. No JVD.  Chest and Lung Exam Auscultation: Breath Sounds:-Normal.  Cardiovascular Auscultation:Rythm- Regular. Murmurs & Other Heart Sounds:Auscultation of the heart reveals- No Murmurs.  Abdomen Inspection:-Inspeection Normal. Palpation/Percussion:Note:No mass. Palpation and Percussion of the abdomen reveal- Non Tender, Non Distended + BS, no rebound or guarding.   Neurologic Cranial Nerve exam:- CN III-XII intact(No nystagmus), symmetric smile. Strength:- 5/5 equal and symmetric strength both upper and lower extremities.       Assessment & Plan:   Patient Instructions  Chf and atrial fibrillation. Clinically stable and seen by cardiolgist just recently end of May. Continue current cardiac meds.   ESRD- continue with dialysis.  Hyopothyroid with high tsh but normal t4. Continue levothyroxine current 137 mcg dose daily. Asking nephrologist to send [name] recent cbc and name of thyroid med they added. Repeat tsh and t4 today.  For restless leg continue current gabapentin.  For  anxiety and at time panic attack giving low dose valium to use sparingly at 1/2 tab dose. Rx advisement given/precautions. Staring med just use at night/before sleep.  Follow up in one month or sooner if needed    [employer], PA-C

## 2021-10-06 NOTE — Patient Instructions (Addendum)
Chf and atrial fibrillation. Clinically stable and seen by cardiologist just recently end of May. Continue current cardiac meds.   ESRD- continue with dialysis.  Hypothyroid with high tsh but normal t4. Continue levothyroxine current 137 mcg dose daily. Asking nephrologist to send [name] recent cbc and name of thyroid med they added. Repeat tsh and t4 today.  For restless leg continue current gabapentin.  For anxiety and at time panic attack giving low dose valium to use sparingly at 1/2 tab dose. Rx advisement given/precautions. Staring med just use at night/before sleep.  Follow up in one month or sooner if needed

## 2021-10-09 ENCOUNTER — Other Ambulatory Visit (HOSPITAL_COMMUNITY): Payer: Self-pay | Admitting: Cardiology

## 2021-10-13 ENCOUNTER — Ambulatory Visit: Payer: Medicare Other | Admitting: Medical

## 2021-10-18 ENCOUNTER — Ambulatory Visit: Payer: Medicare Other | Admitting: Medical

## 2021-10-20 ENCOUNTER — Ambulatory Visit (INDEPENDENT_AMBULATORY_CARE_PROVIDER_SITE_OTHER): Payer: Medicare Other | Admitting: Medical

## 2021-10-20 VITALS — BP 115/64 | HR 82 | Temp 98.2°F | Resp 16 | Ht 59.0 in | Wt 124.6 lb

## 2021-10-20 DIAGNOSIS — I5032 Chronic diastolic (congestive) heart failure: Secondary | ICD-10-CM

## 2021-10-20 DIAGNOSIS — G2581 Restless legs syndrome: Secondary | ICD-10-CM

## 2021-10-20 DIAGNOSIS — G4700 Insomnia, unspecified: Secondary | ICD-10-CM

## 2021-10-20 DIAGNOSIS — E039 Hypothyroidism, unspecified: Secondary | ICD-10-CM | POA: Diagnosis not present

## 2021-10-20 DIAGNOSIS — F419 Anxiety disorder, unspecified: Secondary | ICD-10-CM

## 2021-10-20 MED ORDER — DIAZEPAM 5 MG PO TABS: ORAL_TABLET | ORAL | 0 refills | Status: DC

## 2021-10-20 NOTE — Patient Instructions (Signed)
Chf and atrial fibrillation. Clinically stable and seen by cardiolgist just recently end of May. Continue current cardiac meds.    ESRD- continue with dialysis.   Anxiety, insomnia and restless leg syndrome. Symptoms resolved with valium at night. No longer on gabapentin. I was planning to rx 30 tab for one month. Explained uds and conract. Was set to give uds and sign contract but staff notified pt of home emergency and pt had to leave in hurry. She also could not give urine sample. So rx'd only 5 day rx. Will have pt get scheduled for uds and contract later in order to provider monthly rx refill in future.  Hypothyroid. Recent labs show good tsh normal and t4 mild elevated. We will have you continue levothyroxine 137 mcg daily. Dose. Providing you with copies of labs as you mentioned neprhologist has you on other thyroid supplament. I want you to show them/make them aware of labs as any supplament would likely need to be stopped.  Follow up in one month or sooner if needed.

## 2021-10-20 NOTE — Progress Notes (Signed)
Subjective:    Patient ID: Morgan Clay, female    DOB: [birth year], 76 y.o.   MRN: [id number]  HPI  Pt in for follow up.  Pt has anxiety. Pt states just finished 5 tab rx of valium last night.  Pt states helped with anxiety, insomnia and helped with restless legs. She was taking half tablet at night.   Rx advisement given.  Pt found she did not have to use gabapentin.  Pt has low thyroid. T4 recently 1.83. tsh normal. Continue current levothyroxine. I still don't know thyroid supplament pt reports being given at dialysis?   Atrial fibrillation and chf. Rate controlled and on eliquis.  Review of Systems  Constitutional:  Negative for chills, fatigue and fever.  Respiratory:  Negative for chest tightness, shortness of breath and wheezing.   Cardiovascular:  Negative for chest pain and palpitations.  Gastrointestinal:  Negative for abdominal pain.  Musculoskeletal:  Negative for back pain and myalgias.  Skin:  Negative for rash.  Neurological:  Negative for dizziness, light-headedness, numbness and headaches.  Hematological:  Negative for adenopathy. Does not bruise/bleed easily.  Psychiatric/Behavioral:  Negative for behavioral problems, confusion and dysphoric mood. The patient is nervous/anxious.     Past Medical History:  Diagnosis Date   Acute blood loss anemia 09/24/2020   AICD (automatic cardioverter/defibrillator) present 10/06/2015   Anemia due to chronic blood loss 04/15/2020   Anemia in chronic kidney disease 01/18/2018   Angiodysplasia of small intestine 06/10/2020   Ileum - seen on capsule endoscopy 05/2020 - ablated at colonoscopy   Aortic atherosclerosis ([place]) 08/30/2017   Arthritis    "qwhere" (01/03/2018)   Asthma    reports mild asthma since childhood - had COPD on dx list from prior PCP   Atrial fibrillation with RVR ([place]) 11/17/2020   Atrial fibrillation, chronic 10/25/2017   Atypical atrial flutter ([place]) 09/28/2015   AVM (arteriovenous  malformation) of small bowel, acquired 01/14/2018   Biventricular ICD (implantable cardioverter-defibrillator) in place 01/08/2007   Qualifier: Diagnosis of  By: Hassell Done FNP, Nykedtra     Bleeding pseudoaneurysm of left brachiocephalic arteriovenous fistula ([place]) 09/24/2020   Bleeding pseudoaneurysm of left brachiocephalic AV fistula ([place]) 09/24/2020   Carpal tunnel syndrome, bilateral 06/09/2021   Chronic bronchitis ([place])    "get it most years; not this past year though" (01/03/2018)   Chronic diastolic CHF (congestive heart failure) ([place]) 04/24/2007   Annotation: secondary to nonischemic cardiomyopathy s/p CRT-D Followed by Dr. Caryl Comes in Cardiology Qualifier: Diagnosis of  By: Hassell Done FNP, Nykedtra     Chronic obstructive pulmonary disease ([place])    Chronic systolic congestive heart failure ([place])    CKD (chronic kidney disease) stage 4, GFR 15-29 ml/min  11/24/2013   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 (HCV) ([place])    HEPATITIS C - s/p treatment with Harvoni, saw hepatology, Dawn Drazek   COVID-19 virus infection 05/27/2020   Diabetes mellitus without complication [facility])    DIET CONTROLLED    Dialysis complication    Diverticulitis of left colon status post robotic low anterior to sigmoid resection 05/22/2018 07/31/2017   End stage renal disease ([place]) 03/22/2021   FIBROIDS, UTERUS 03/05/2008   Glaucoma    Gout    History of blood transfusion ~ 11/2017   Hypertension associated with diabetes ([place]) 06/07/2017   Hypothyroidism    LBBB (left bundle branch block)    Lesion of ulnar nerve 06/09/2021   Long term current use of anticoagulant    Lower GI  bleeding    "been dealing w/it since 07/2017" (01/03/2018)   Malnutrition of moderate degree 07/09/2020   MDD (major depressive disorder), recurrent, in partial remission ([place]) 06/07/2017   Nonischemic cardiomyopathy ([place])    Obesity 05/27/2009   Qualifier: Diagnosis of  By: Hassell Done FNP, Nykedtra     OBSTRUCTIVE SLEEP APNEA  11/14/2007   no CPAP   OSTEOPENIA 09/30/2008   Other cirrhosis of liver ([place]) 08/12/2020   Formatting of this note is different from the original. Patient with history of cirrhosis secondary to hepatitis C with no history of hepatic decompensation.  She has had some hyponatremia and hypoalbuminemia in the past however most recent labs show relatively well-preserved liver synthetic function.  She will have labs to update liver synthetic function.  Hepatoma screening - patient is currentl   Overweight (BMI 25.0-29.9) 03/02/2020   Pain in right knee 05/13/2018   Persistent atrial fibrillation ([place]) 02/06/2020   Pneumonia    "several times" (01/03/2018)   Polyneuropathy associated with underlying disease ([place]) 06/09/2021   Pulmonary hypertension ([place]) on echocardiogram 01/14/2018   Secondary esophageal varices without bleeding ([place]) 12/23/2020   Formatting of this note might be different from the original. Patient had trace esophageal varices during small bowel enteroscopy in October 2021, follow-up EGD in January 2022 with no varices.  She previously had GI bleeding thought to be from AVM versus GAVE.  She is no longer on carvedilol and will need surveillance endoscopy.  Patient was previously followed at [facility] and I have asked her    Secondary hypercoagulable state ([place]) 02/06/2020   Type 2 diabetes, controlled, with renal manifestation ([place]) 11/24/2013     Social History   Socioeconomic History   Marital status: Divorced    Spouse name: Not on file   Number of children: 1   Years of education: 12   Highest education level: Not on file  Occupational History   Not on file  Tobacco Use   Smoking status: Never   Smokeless tobacco: Never  Vaping Use   Vaping Use: Never used  Substance and Sexual Activity   Alcohol use: Not Currently    Comment: 01/03/2018 "couple drinks/month"   Drug use: Not Currently    Types: Marijuana    Comment: VERY INTERMITTENT    Sexual activity: Not  Currently  Other Topics Concern   Not on file  Social History Narrative   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      Home Situation: lives alone      Spiritual Beliefs: Christian - Baptist      Lifestyle: no regular exercise; poor diet      She is divorced at least one adult child   Never smoker    rare alcohol to occasional at best    no substance abuse         Social Determinants of Health   Financial Resource Strain: Low Risk  (07/15/2021)   Overall Financial Resource Strain (CARDIA)    Difficulty of Paying Living Expenses: Not hard at all  Food Insecurity: No Food Insecurity (07/15/2021)   Hunger Vital Sign    Worried About Running Out of Food in the Last Year: Never true    Ran Out of Food in the Last Year: Never true  Transportation Needs: No Transportation Needs (07/15/2021)   PRAPARE - Hydrologist (Medical): No    Lack of Transportation (Non-Medical): No  Physical Activity: Not on file  Stress: No Stress  Concern Present (07/15/2021)   [place]    Feeling of Stress : Not at all  Social Connections: Moderately Isolated (07/15/2021)   Social Connection and Isolation Panel [NHANES]    Frequency of Communication with Friends and Family: More than three times a week    Frequency of Social Gatherings with Friends and Family: Three times a week    Attends Religious Services: More than 4 times per year    Active Member of Clubs or Organizations: No    Attends Archivist Meetings: Never    Marital Status: Divorced  Human resources officer Violence: Not At Risk (07/15/2021)   Humiliation, Afraid, Rape, and Kick questionnaire    Fear of Current or Ex-Partner: No    Emotionally Abused: No    Physically Abused: No    Sexually Abused: No    Past Surgical History:  Procedure Laterality Date   A/V FISTULAGRAM N/A 03/28/2021   Procedure: A/V FISTULAGRAM;  Surgeon: Waynetta Sandy, MD;  Location: [facility];  Service: Cardiovascular;  Laterality: N/A;   APPENDECTOMY     AV FISTULA PLACEMENT Left 08/05/2020   Procedure: LEFT UPPER EXTREMITY ARTERIOVENOUS (AV) FISTULA CREATION;  Surgeon: Cherre Robins, MD;  Location: [place];  Service: Vascular;  Laterality: Left;   [place] Left 09/16/2020   Procedure: LEFT SECOND STAGE [place];  Surgeon: Cherre Robins, MD;  Location: [place];  Service: Vascular;  Laterality: Left;  PERIPHERAL NERVE BLOCK   CARDIAC CATHETERIZATION     CARDIOVERSION N/A 12/14/2014   Procedure: CARDIOVERSION;  Surgeon: Dorothy Spark, MD;  Location: [place];  Service: Cardiovascular;  Laterality: N/A;   CARDIOVERSION N/A 02/26/2017   Procedure: CARDIOVERSION;  Surgeon: Evans Lance, MD;  Location: [place] [facility];  Service: Cardiovascular;  Laterality: N/A;   CARDIOVERSION N/A 07/24/2017   Procedure: CARDIOVERSION;  Surgeon: Acie Fredrickson Wonda Cheng, MD;  Location: [facility] ENDOSCOPY;  Service: Cardiovascular;  Laterality: N/A;   CARDIOVERSION N/A 02/12/2020   Procedure: CARDIOVERSION;  Surgeon: [name], MD;  Location: [facility] ENDOSCOPY;  Service: Cardiovascular;  Laterality: N/A;   CARDIOVERSION N/A 09/15/2021   Procedure: CARDIOVERSION;  Surgeon: [name], MD;  Location: [facility] ENDOSCOPY;  Service: Cardiovascular;  Laterality: N/A;   CARPAL TUNNEL RELEASE  07/21/2021   COLONOSCOPY N/A 11/08/2017   Procedure: COLONOSCOPY;  Surgeon: Milus Banister, MD;  Location: [facility] ENDOSCOPY;  Service: Endoscopy;  Laterality: N/A;   COLONOSCOPY WITH PROPOFOL N/A 05/31/2020   Procedure: COLONOSCOPY WITH PROPOFOL;  Surgeon: Irene Shipper, MD;  Location: [facility] ENDOSCOPY;  Service: Endoscopy;  Laterality: N/A;   DILATION AND CURETTAGE OF UTERUS     ENTEROSCOPY N/A 03/02/2020   Procedure: ENTEROSCOPY;  Surgeon: Yetta Flock, MD;  Location: [facility] ENDOSCOPY;  Service: Gastroenterology;  Laterality:  N/A;   EP IMPLANTABLE DEVICE N/A 10/06/2015   Procedure: BIV ICD Generator Changeout;  Surgeon: [name], MD;  Location: [place] [facility];  Service: Cardiovascular;  Laterality: N/A;   ESOPHAGOGASTRODUODENOSCOPY N/A 10/26/2017   Procedure: ESOPHAGOGASTRODUODENOSCOPY (EGD);  Surgeon: Ladene Artist, MD;  Location[facility];  Service: Endoscopy;  Laterality: N/A;   ESOPHAGOGASTRODUODENOSCOPY (EGD) WITH PROPOFOL N/A 11/07/2017   Procedure: ESOPHAGOGASTRODUODENOSCOPY (EGD) WITH PROPOFOL;  Surgeon: Milus Banister, MD;  [facility];  Service: Endoscopy;  Laterality: N/A;   ESOPHAGOGASTRODUODENOSCOPY (EGD) WITH PROPOFOL N/A 05/29/2020   Procedure: ESOPHAGOGASTRODUODENOSCOPY (EGD) WITH PROPOFOL;  Surgeon: Wilfrid Lund  L III, MD;  Location: [place];  Service: Gastroenterology;  Laterality: N/A;   GIVENS CAPSULE STUDY N/A 05/19/2020   Procedure: GIVENS CAPSULE STUDY;  Surgeon: [name], MD;  Location: [place];  Service: Endoscopy;  Laterality: N/A;  .adm for obs since pacemaker, PA wil enter order and see pt   GIVENS CAPSULE STUDY N/A 05/29/2020   Procedure: GIVENS CAPSULE STUDY;  Surgeon: Doran Stabler, MD;  Location: [place];  Service: Gastroenterology;  Laterality: N/A;   HEMATOMA EVACUATION Left 09/24/2020   Procedure: EVACUATION HEMATOMA ARM;  Surgeon: Rosetta Posner, MD;  Location: [facility] OR;  Service: Vascular;  Laterality: Left;   HOT HEMOSTASIS N/A 10/26/2017   Procedure: HOT HEMOSTASIS (ARGON PLASMA COAGULATION/BICAP);  Surgeon: Ladene Artist, MD;  Location: [facility];  Service: Endoscopy;  Laterality: N/A;   HOT HEMOSTASIS N/A 03/02/2020   Procedure: HOT HEMOSTASIS (ARGON PLASMA COAGULATION/BICAP);  Surgeon: Yetta Flock, MD;  Location: [facility] ENDOSCOPY;  Service: Gastroenterology;  Laterality: N/A;   HOT HEMOSTASIS N/A 05/31/2020   Procedure: HOT HEMOSTASIS (ARGON PLASMA COAGULATION/BICAP);  Surgeon: Irene Shipper, MD;  Location: [facility]  ENDOSCOPY;  Service: Endoscopy;  Laterality: N/A;   INSERT / REPLACE / REMOVE PACEMAKER  ?2008   IR PERC TUN PERIT CATH WO PORT S&I /IMAG  07/05/2020   PERIPHERAL VASCULAR INTERVENTION Left 03/28/2021   Procedure: PERIPHERAL VASCULAR INTERVENTION;  Surgeon: Waynetta Sandy, MD;  Location: [facility];  Service: Cardiovascular;  Laterality: Left;   POLYPECTOMY  11/08/2017   Procedure: POLYPECTOMY;  Surgeon: Milus Banister, MD;  [facility];  Service: Endoscopy;;   PROCTOSCOPY N/A 05/22/2018   Procedure: RIGID PROCTOSCOPY;  Surgeon: Michael Boston, MD;  [facility];  Service: General;  Laterality: N/A;   RIGHT HEART CATH N/A 02/13/2018   Procedure: RIGHT HEART CATH;  Surgeon: [name], MD;  Location: [place] [facility];  Service: Cardiovascular;  Laterality: N/A;   RIGHT HEART CATH N/A 06/23/2020   Procedure: RIGHT HEART CATH;  Surgeon: [name], MD;  Location: [place];  Service: Cardiovascular;  Laterality: N/A;   RIGHT/LEFT HEART CATH AND CORONARY ANGIOGRAPHY N/A 12/11/2019   Procedure: RIGHT/LEFT HEART CATH AND CORONARY ANGIOGRAPHY;  Surgeon: [name], MD;  Location: [place] [facility];  Service: Cardiovascular;  Laterality: N/A;   SUBMUCOSAL TATTOO INJECTION  03/02/2020   Procedure: SUBMUCOSAL TATTOO INJECTION;  Surgeon: Yetta Flock, MD;  Location: [facility] ENDOSCOPY;  Service: Gastroenterology;;   SUBMUCOSAL TATTOO INJECTION  05/31/2020   Procedure: SUBMUCOSAL TATTOO INJECTION;  Surgeon: Irene Shipper, MD;  Location: [place];  Service: Endoscopy;;   TEE WITHOUT CARDIOVERSION N/A 08/02/2021   Procedure: TRANSESOPHAGEAL ECHOCARDIOGRAM (TEE);  Surgeon: [name], MD;  Location[facility] END[facility]COPY;  Service: Cardiovascular;  Laterality: N/A;   TUBAL LIGATION     XI ROBOTIC ASSISTED LOWER ANTERIOR RESECTION N/A 05/22/2018   Procedure: XI ROBOTIC ASSISTED SIGMOID COLOECTOMY MOBILIZATION OF SPLENIC FLEXURE, FIREFLY  ASSESSMENT OF PERFUSION;  Surgeon: Michael Boston, MD;  [facility];  Service: General;  Laterality: N/A;  ERAS PATHWAY    Family History  Problem Relation Age of Onset   Asthma Father    Heart attack Father    Asthma Sister    Lung cancer Sister    Heart attack Mother    Stroke Brother    Colon cancer Neg Hx    Esophageal cancer Neg Hx    Pancreatic cancer Neg Hx  Stomach cancer Neg Hx    Liver disease Neg Hx     No Known Allergies  Current Outpatient Medications on File Prior to Visit  Medication Sig Dispense Refill   amiodarone (PACERONE) 200 MG tablet Take 1 tablet (200 mg total) by mouth daily. 90 tablet 3   AURYXIA 1 GM 210 MG(Fe) tablet Take 420 mg by mouth 3 (three) times daily with meals.     ELIQUIS 5 MG TABS tablet Take 1 tablet by mouth twice daily 60 tablet 11   gabapentin (NEURONTIN) 300 MG capsule Take 1 capsule (300 mg total) by mouth daily. 180 capsule 2   isosorbide-hydrALAZINE (BIDIL) 20-37.5 MG tablet Take 0.5 tablets by mouth 3 (three) times daily. 126 tablet 5   levothyroxine (SYNTHROID) 137 MCG tablet Take 1 tablet (137 mcg total) by mouth daily before breakfast. 90 tablet 3   lidocaine-prilocaine (EMLA) cream 1 application. 3 (three) times a week.     traZODone (DESYREL) 50 MG tablet Take 0.5-1 tablets (25-50 mg total) by mouth at bedtime as needed for sleep. 30 tablet 3   XIIDRA 5 % SOLN Place 1 drop into both eyes 2 (two) times daily.     No current facility-administered medications on file prior to visit.    BP 115/64 (BP Location: Left Arm, Patient Position: Sitting, Cuff Size: Normal)   Pulse 82   Temp 98.2 F (36.8 C) (Oral)   Resp 16   Ht '4\' 11"'$  (1.499 m)   Wt 124 lb 9.6 oz (56.5 kg)   SpO2 98%   BMI 25.17 kg/m        Objective:   Physical Exam  General Mental Status- Alert. General Appearance- Not in acute distress.    Skin General: Color- Normal Color. Moisture- Normal Moisture.   Neck Carotid Arteries- Normal color.  Moisture- Normal Moisture. No carotid bruits. No JVD.   Chest and Lung Exam Auscultation: Breath Sounds:-Normal.   Cardiovascular Auscultation:Rythm- Regular. Murmurs & Other Heart Sounds:Auscultation of the heart reveals- No Murmurs.   Abdomen Inspection:-Inspeection Normal. Palpation/Percussion:Note:No mass. Palpation and Percussion of the abdomen reveal- Non Tender, Non Distended + BS, no rebound or guarding.     Neurologic Cranial Nerve exam:- CN III-XII intact(No nystagmus), symmetric smile. Strength:- 5/5 equal and symmetric strength both upper and lower extremities.       Assessment & Plan:   Chf and atrial fibrillation. Clinically stable and seen by cardiolgist just recently end of May. Continue current cardiac meds.    ESRD- continue with dialysis.   Anxiety, insomnia and restless leg syndrome. Symptoms resolved with valium at night. No longer on gabapentin. I was planning to rx 30 tab for one month. Explained uds and conract. Was set to give uds and sign contract but staff notified pt of home emergency and pt had to leave in hurry. She also could not give urine sample. So rx'd only 5 day rx. Will have pt get scheduled for uds and contract later in order to provider monthly rx refill in future.  Hypothyroid. Recent labs show good tsh normal and t4 mild elevated. We will have you continue levothyroxine 137 mcg daily. Dose. Providing you with copies of labs as you mentioned neprhologist has you on other thyroid supplament. I want you to show them/make them aware of labs as any supplament would likely need to be stopped.  Follow up in one month or sooner if needed.  Mackie Pai, PA-C

## 2021-10-24 ENCOUNTER — Encounter (HOSPITAL_BASED_OUTPATIENT_CLINIC_OR_DEPARTMENT_OTHER): Payer: Self-pay | Admitting: Urology

## 2021-10-24 ENCOUNTER — Emergency Department (HOSPITAL_BASED_OUTPATIENT_CLINIC_OR_DEPARTMENT_OTHER): Payer: Medicare Other

## 2021-10-24 ENCOUNTER — Other Ambulatory Visit: Payer: Self-pay

## 2021-10-24 ENCOUNTER — Observation Stay (HOSPITAL_BASED_OUTPATIENT_CLINIC_OR_DEPARTMENT_OTHER)
Admission: EM | Admit: 2021-10-24 | Discharge: 2021-10-27 | Disposition: A | Discharge status: Home / Self Care | Payer: Medicare Other | Attending: Internal Medicine | Admitting: Internal Medicine

## 2021-10-24 DIAGNOSIS — K7460 Unspecified cirrhosis of liver: Secondary | ICD-10-CM | POA: Insufficient documentation

## 2021-10-24 DIAGNOSIS — J441 Chronic obstructive pulmonary disease with (acute) exacerbation: Secondary | ICD-10-CM | POA: Insufficient documentation

## 2021-10-24 DIAGNOSIS — J449 Chronic obstructive pulmonary disease, unspecified: Secondary | ICD-10-CM | POA: Diagnosis present

## 2021-10-24 DIAGNOSIS — K625 Hemorrhage of anus and rectum: Secondary | ICD-10-CM | POA: Diagnosis present

## 2021-10-24 DIAGNOSIS — Z992 Dependence on renal dialysis: Secondary | ICD-10-CM | POA: Diagnosis not present

## 2021-10-24 DIAGNOSIS — D696 Thrombocytopenia, unspecified: Secondary | ICD-10-CM | POA: Insufficient documentation

## 2021-10-24 DIAGNOSIS — Z8616 Personal history of COVID-19: Secondary | ICD-10-CM | POA: Diagnosis not present

## 2021-10-24 DIAGNOSIS — I482 Chronic atrial fibrillation, unspecified: Secondary | ICD-10-CM | POA: Diagnosis present

## 2021-10-24 DIAGNOSIS — Z7901 Long term (current) use of anticoagulants: Secondary | ICD-10-CM | POA: Diagnosis not present

## 2021-10-24 DIAGNOSIS — K5730 Diverticulosis of large intestine without perforation or abscess without bleeding: Secondary | ICD-10-CM | POA: Diagnosis not present

## 2021-10-24 DIAGNOSIS — N186 End stage renal disease: Secondary | ICD-10-CM | POA: Diagnosis not present

## 2021-10-24 DIAGNOSIS — E039 Hypothyroidism, unspecified: Secondary | ICD-10-CM | POA: Insufficient documentation

## 2021-10-24 DIAGNOSIS — Z79899 Other long term (current) drug therapy: Secondary | ICD-10-CM | POA: Diagnosis not present

## 2021-10-24 DIAGNOSIS — I132 Hypertensive heart and chronic kidney disease with heart failure and with stage 5 chronic kidney disease, or end stage renal disease: Secondary | ICD-10-CM | POA: Insufficient documentation

## 2021-10-24 DIAGNOSIS — I4819 Other persistent atrial fibrillation: Secondary | ICD-10-CM | POA: Diagnosis not present

## 2021-10-24 DIAGNOSIS — I959 Hypotension, unspecified: Secondary | ICD-10-CM | POA: Diagnosis present

## 2021-10-24 DIAGNOSIS — D62 Acute posthemorrhagic anemia: Secondary | ICD-10-CM | POA: Diagnosis not present

## 2021-10-24 DIAGNOSIS — E1122 Type 2 diabetes mellitus with diabetic chronic kidney disease: Secondary | ICD-10-CM | POA: Diagnosis not present

## 2021-10-24 DIAGNOSIS — I5022 Chronic systolic (congestive) heart failure: Secondary | ICD-10-CM | POA: Insufficient documentation

## 2021-10-24 DIAGNOSIS — Z95810 Presence of automatic (implantable) cardiac defibrillator: Secondary | ICD-10-CM | POA: Diagnosis not present

## 2021-10-24 DIAGNOSIS — K3189 Other diseases of stomach and duodenum: Secondary | ICD-10-CM | POA: Diagnosis not present

## 2021-10-24 DIAGNOSIS — B1920 Unspecified viral hepatitis C without hepatic coma: Secondary | ICD-10-CM | POA: Diagnosis present

## 2021-10-24 DIAGNOSIS — I428 Other cardiomyopathies: Secondary | ICD-10-CM

## 2021-10-24 DIAGNOSIS — K648 Other hemorrhoids: Secondary | ICD-10-CM | POA: Insufficient documentation

## 2021-10-24 DIAGNOSIS — Z980 Intestinal bypass and anastomosis status: Secondary | ICD-10-CM | POA: Diagnosis not present

## 2021-10-24 DIAGNOSIS — D649 Anemia, unspecified: Secondary | ICD-10-CM

## 2021-10-24 DIAGNOSIS — J45909 Unspecified asthma, uncomplicated: Secondary | ICD-10-CM | POA: Insufficient documentation

## 2021-10-24 DIAGNOSIS — K922 Gastrointestinal hemorrhage, unspecified: Secondary | ICD-10-CM

## 2021-10-24 LAB — COMPREHENSIVE METABOLIC PANEL
ALT: 12 U/L (ref 0–44)
AST: 22 U/L (ref 15–41)
Albumin: 4.1 g/dL (ref 3.5–5.0)
Alkaline Phosphatase: 73 U/L (ref 38–126)
Anion gap: 11 (ref 5–15)
BUN: 10 mg/dL (ref 8–23)
CO2: 33 mmol/L — ABNORMAL HIGH (ref 22–32)
Calcium: 8.2 mg/dL — ABNORMAL LOW (ref 8.9–10.3)
Chloride: 94 mmol/L — ABNORMAL LOW (ref 98–111)
Creatinine, Ser: 3.74 mg/dL — ABNORMAL HIGH (ref 0.44–1.00)
GFR, Estimated: 12 mL/min — ABNORMAL LOW (ref >60)
Glucose, Bld: 106 mg/dL — ABNORMAL HIGH (ref 70–99)
Potassium: 3.6 mmol/L (ref 3.5–5.1)
Sodium: 138 mmol/L (ref 135–145)
Total Bilirubin: 0.6 mg/dL (ref 0.3–1.2)
Total Protein: 8 g/dL (ref 6.5–8.1)

## 2021-10-24 LAB — CBC WITH DIFFERENTIAL/PLATELET
Abs Immature Granulocytes: 0.03 10*3/uL (ref 0.00–0.07)
Basophils Absolute: 0 10*3/uL (ref 0.0–0.1)
Basophils Relative: 1 %
Eosinophils Absolute: 0.1 10*3/uL (ref 0.0–0.5)
Eosinophils Relative: 2 %
HCT: 29.6 % — ABNORMAL LOW (ref 36.0–46.0)
Hemoglobin: 9.9 g/dL — ABNORMAL LOW (ref 12.0–15.0)
Immature Granulocytes: 1 %
Lymphocytes Relative: 18 %
Lymphs Abs: 1 10*3/uL (ref 0.7–4.0)
MCH: 31.4 pg (ref 26.0–34.0)
MCHC: 33.4 g/dL (ref 30.0–36.0)
MCV: 94 fL (ref 80.0–100.0)
Monocytes Absolute: 0.6 10*3/uL (ref 0.1–1.0)
Monocytes Relative: 11 %
Neutro Abs: 3.7 10*3/uL (ref 1.7–7.7)
Neutrophils Relative %: 67 %
Platelets: 96 10*3/uL — ABNORMAL LOW (ref 150–400)
RBC: 3.15 MIL/uL — ABNORMAL LOW (ref 3.87–5.11)
RDW: 16.7 % — ABNORMAL HIGH (ref 11.5–15.5)
Smear Review: DECREASED
WBC: 5.4 10*3/uL (ref 4.0–10.5)
nRBC: 0 % (ref 0.0–0.2)

## 2021-10-24 LAB — OCCULT BLOOD X 1 CARD TO LAB, STOOL: Fecal Occult Bld: POSITIVE — AB

## 2021-10-24 LAB — HEMOGLOBIN AND HEMATOCRIT, BLOOD
HCT: 27.3 % — ABNORMAL LOW (ref 36.0–46.0)
Hemoglobin: 8.9 g/dL — ABNORMAL LOW (ref 12.0–15.0)

## 2021-10-24 LAB — CBG MONITORING, ED: Glucose-Capillary: 108 mg/dL — ABNORMAL HIGH (ref 70–99)

## 2021-10-24 MED ORDER — IOHEXOL 300 MG/ML  SOLN
100.0000 mL | Freq: Once | INTRAMUSCULAR | Status: AC | PRN
  Administered 2021-10-24: 100 mL via INTRAVENOUS

## 2021-10-24 NOTE — ED Notes (Signed)
Checked CBG 108, RN Shaun informed

## 2021-10-24 NOTE — Discharge Instructions (Addendum)
Follow with Primary MD Saguier, Percell Miller, PA-C in 7 days   Get CBC, CMP, anemia panel-  checked next visit within 1 week by Primary MD    Activity: As tolerated with Full fall precautions use walker/cane & assistance as needed  Disposition Home   Diet: Renal diet with 1.5 L fluid restriction per day  Special Instructions: If you have smoked or chewed Tobacco  in the last 2 yrs please stop smoking, stop any regular Alcohol  and or any Recreational drug use.  On your next visit with your primary care physician please Get Medicines reviewed and adjusted.  Please request your Prim.MD to go over all Hospital Tests and Procedure/Radiological results at the follow up, please get all Hospital records sent to your Prim MD by signing hospital release before you go home.  If you experience worsening of your admission symptoms, develop shortness of breath, life threatening emergency, suicidal or homicidal thoughts you must seek medical attention immediately by calling 911 or calling your MD immediately  if symptoms less severe.  You Must read complete instructions/literature along with all the possible adverse reactions/side effects for all the Medicines you take and that have been prescribed to you. Take any new Medicines after you have completely understood and accpet all the possible adverse reactions/side effects.   =================================================================================================== Information on my medicine - ELIQUIS (apixaban)  This medication education was reviewed with me or my healthcare representative as part of my discharge preparation.  Why was Eliquis prescribed for you? Eliquis was prescribed for you to reduce the risk of a blood clot forming that can cause a stroke if you have a medical condition called atrial fibrillation (a type of irregular heartbeat).  What do You need to know about Eliquis ? Take your Eliquis TWICE DAILY - one tablet in the  morning and one tablet in the evening with or without food. If you have difficulty swallowing the tablet whole please discuss with your pharmacist how to take the medication safely.  Take Eliquis exactly as prescribed by your doctor and DO NOT stop taking Eliquis without talking to the doctor who prescribed the medication.  Stopping may increase your risk of developing a stroke.  Refill your prescription before you run out.  After discharge, you should have regular check-up appointments with your healthcare provider that is prescribing your Eliquis.  In the future your dose may need to be changed if your kidney function or weight changes by a significant amount or as you get older.  What do you do if you miss a dose? If you miss a dose, take it as soon as you remember on the same day and resume taking twice daily.  Do not take more than one dose of ELIQUIS at the same time to make up a missed dose.  Important Safety Information A possible side effect of Eliquis is bleeding. You should call your healthcare provider right away if you experience any of the following: Bleeding from an injury or your nose that does not stop. Unusual colored urine (red or dark brown) or unusual colored stools (red or black). Unusual bruising for unknown reasons. A serious fall or if you hit your head (even if there is no bleeding).  Some medicines may interact with Eliquis and might increase your risk of bleeding or clotting while on Eliquis. To help avoid this, consult your healthcare provider or pharmacist prior to using any new prescription or non-prescription medications, including herbals, vitamins, non-steroidal anti-inflammatory drugs (NSAIDs) and supplements.  This website has more information on Eliquis (apixaban): http://www.eliquis.com/eliquis/home

## 2021-10-24 NOTE — ED Provider Notes (Incomplete)
[facility]ENC[facility] DEPARTMENT Provider Note   CSN: [id number] Arrival date & time: 10/24/21  1943     History {Add pertinent medical, surgical, social history, OB history to HPI:1} Chief Complaint  Patient presents with  . Rectal Bleeding    Morgan Clay is a 76 y.o. female.  Patient presents to the hospital complaining of rectal bleeding that began today.  Patient states that this morning she began to have left lower quadrant abdominal pain followed by bright red bleeding with clots from rectum.  She also had some shaking while at dialysis today.  Patient is a Monday Wednesday Friday dialysis patient.  Patient states she still makes a small amount of urine.  Patient is also on Eliquis for atrial fibrillation.  Patient denies nausea, vomiting, diarrhea, constipation.  Denies chest pain, shortness of breath.  Does endorse some weakness.  Extensive past medical history including chronic systolic congestive heart failure, AICD, history of transfusion, chronic diastolic CHF, chronic A-fib, COPD, end-stage renal disease on dialysis, cirrhosis of liver, secondary hypercoagulable state, long-term current use of anticoagulants, anemia due to chronic blood loss, type II DM, secondary esophageal varices, pulmonary hypertension, aortic atherosclerosis, history of lower GI bleed  HPI     Home Medications Prior to Admission medications   Medication Sig Start Date End Date Taking? Authorizing Provider  amiodarone (PACERONE) 200 MG tablet Take 1 tablet (200 mg total) by mouth daily. 07/27/20   Clegg, [name] D, NP  AURYXIA 1 GM 210 MG(Fe) tablet Take 420 mg by mouth 3 (three) times daily with meals. 08/11/21   [provider]  diazepam (VALIUM) 5 MG tablet 1 tab po bid prn anxiety 10/20/21   Saguier, Percell Miller, PA-C  ELIQUIS 5 MG TABS tablet Take 1 tablet by mouth twice daily 10/10/21   [name], MD  gabapentin (NEURONTIN) 300 MG capsule Take 1 capsule (300 mg total) by mouth  daily. 07/12/20   Lyda Jester M, PA-C  isosorbide-hydrALAZINE (BIDIL) 20-37.5 MG tablet Take 0.5 tablets by mouth 3 (three) times daily. 10/21/20   [name], MD  levothyroxine (SYNTHROID) 137 MCG tablet Take 1 tablet (137 mcg total) by mouth daily before breakfast. 03/11/20   Billie Ruddy, MD  lidocaine-prilocaine (EMLA) cream 1 application. 3 (three) times a week. 09/08/21   [provider]  traZODone (DESYREL) 50 MG tablet Take 0.5-1 tablets (25-50 mg total) by mouth at bedtime as needed for sleep. 12/16/20   Saguier, Percell Miller, PA-C  XIIDRA 5 % SOLN Place 1 drop into both eyes 2 (two) times daily. 08/29/21   [provider]      Allergies    Patient has no known allergies.    Review of Systems   Review of Systems  Constitutional:  Negative for fever.  Respiratory:  Negative for shortness of breath.   Cardiovascular:  Negative for chest pain.  Gastrointestinal:  Positive for abdominal pain and blood in stool. Negative for constipation, diarrhea, nausea and vomiting.  Genitourinary:  Negative for dysuria.  Skin:  Negative for pallor.  Neurological:  Negative for weakness and light-headedness.    Physical Exam Updated Vital Signs BP (!) 134/93 (BP Location: Right Arm)   Pulse 87   Resp 17   Ht '4\' 11"'$  (1.499 m)   Wt 57.2 kg   SpO2 100%   BMI 25.45 kg/m  Physical Exam Vitals and nursing note reviewed.  Constitutional:      General: She is not in acute distress.  Appearance: She is normal weight.  HENT:     Head: Normocephalic and atraumatic.     Mouth/Throat:     Mouth: Mucous membranes are moist.  Eyes:     Conjunctiva/sclera: Conjunctivae normal.  Cardiovascular:     Rate and Rhythm: Normal rate. Rhythm irregular.     Pulses: Normal pulses.     Heart sounds: Normal heart sounds.  Pulmonary:     Effort: Pulmonary effort is normal.     Breath sounds: Normal breath sounds.  Abdominal:     Palpations: Abdomen is soft.     Tenderness: There  is abdominal tenderness.     Comments: LLQ tenderness  Genitourinary:    Rectum: Guaiac result positive.  Musculoskeletal:     Cervical back: Normal range of motion and neck supple.     Right lower leg: No edema.     Left lower leg: No edema.  Skin:    General: Skin is warm and dry.     Capillary Refill: Capillary refill takes less than 2 seconds.  Neurological:     Mental Status: She is alert.     ED Results / Procedures / Treatments   Labs (all labs ordered are listed, but only abnormal results are displayed) Labs Reviewed  COMPREHENSIVE METABOLIC PANEL - Abnormal; Notable for the following components:      Result Value   Chloride 94 (*)    CO2 33 (*)    Glucose, Bld 106 (*)    Creatinine, Ser 3.74 (*)    Calcium 8.2 (*)    GFR, Estimated 12 (*)    All other components within normal limits  CBG MONITORING, ED - Abnormal; Notable for the following components:   Glucose-Capillary 108 (*)    All other components within normal limits  CBC WITH DIFFERENTIAL/PLATELET  POC OCCULT BLOOD, ED    EKG None  Radiology No results found.  Procedures Procedures  {Document cardiac monitor, telemetry assessment procedure when appropriate:1}  Medications Ordered in ED Medications - No data to display  ED Course/ Medical Decision Making/ A&P Clinical Course as of 10/25/21 0002  Mon Oct 24, 2021  2336 This is a 76 year old female with a history of A-fib on Eliquis presenting to ED with dark stools, that began in the past 24 hours.  She reported some cramping left lower sided abdominal pain.  The pain is eased up.  Her blood work today was reassuring, white blood cell count normal, hemoglobin at baseline levels at 9.9, CT scan with no acute findings suggest diverticulitis.  She has Hemoccult positive.  She was observed for approximately 3 hours and we will recheck a hemoglobin hematocrit.  If there is no significant drop, she would prefer to follow-up as an outpatient with her PCP,  and states that she can contact them about repeating an outpatient hemoglobin.  I think this would be reasonable.  I would have her hold her Eliquis tonight and tomorrow.  Both the patient and her daughter at bedside verbalized understanding and agreement.  We did discuss the alternative of observation in the hospital, but she would prefer not to.  We will however follow-up on the hemoglobin, if there is a significant drop, I would recommend again hospitalization. [MT]  2343 Repeat hemoglobin dropped, we will admit the patient to observation instead. [MT]    Clinical Course User Index [MT] Trifan, Carola Rhine, MD  Medical Decision Making Amount and/or Complexity of Data Reviewed Labs: ordered.   This patient presents to the ED for concern of rectal bleeding, this involves an extensive number of treatment options, and is a complaint that carries with it a high risk of complications and morbidity.  The differential diagnosis includes diverticulitis, lower GI bleed, upper GI bleed, hemorrhoids, and others   Co morbidities that complicate the patient evaluation  History of lower GI bleeds, chronic anemia blood loss   Additional history obtained:   External records from outside source obtained and reviewed including planned EGD on July 18.  Gastroenterology visits with [facility] gastroenterology for hepatic cirrhosis   Lab Tests:  I Ordered, and personally interpreted labs.  The pertinent results include: Positive fecal occult blood test, hemoglobin 9.9, stable from 2 weeks ago at 9.8, creatinine 3.74   Imaging Studies ordered:  I ordered imaging studies including CT abdomen pelvis I independently visualized and interpreted imaging which showed  1. Cirrhosis and hepatic steatosis with no definite findings of portal hypertension. No focal liver lesions identified. Please note that liver protocol enhanced MR and CT are the most sensitive tests for the screening  detection of hepatocellular carcinoma in the high risk setting of cirrhosis. 2. Likely trace free fluid containing left inguinal hernia 3. Uterine fibroids. 4. Prominent inferior vena cava with retrograde reflux of contrast from the right atrium. Query right heart strain. 5. Aortic Atherosclerosi  I agree with the radiologist interpretation  Consultations Obtained:  I requested consultation with the hospitalist,  and discussed lab and imaging findings as well as pertinent plan - they recommend: ***   Test / Admission - Considered:  Patient has a GI bleed of unknown origin at this time. Considered discharge home depending on stability of hemoglobin. Repeat H and H showed 1 point hemoglobin drop. After discussion with attending, Dr.Trifan, plan to request admission for further evaluation and observation. Messaged on call GI to add to rounding list. Admit to hospital.    {Document critical care time when appropriate:1} {Document review of labs and clinical decision tools ie heart score, Chads2Vasc2 etc:1}  {Document your independent review of radiology images, and any outside records:1} {Document your discussion with family members, caretakers, and with consultants:1} {Document social determinants of health affecting pt's care:1} {Document your decision making why or why not admission, treatments were needed:1} Final Clinical Impression(s) / ED Diagnoses Final diagnoses:  None    Rx / DC Orders ED Discharge Orders     None

## 2021-10-24 NOTE — ED Provider Notes (Signed)
[facility] Provider Note   CSN: [id number] Arrival date & time: 10/24/21  1943     History  Chief Complaint  Patient presents with   Rectal Bleeding    Morgan Clay is a 76 y.o. female.  Patient presents to the hospital complaining of rectal bleeding that began today.  Patient states that this morning she began to have left lower quadrant abdominal pain followed by bright red bleeding with clots from rectum.  She also had some shaking while at dialysis today.  Patient is a Monday Wednesday Friday dialysis patient.  Patient states she still makes a small amount of urine.  Patient is also on Eliquis for atrial fibrillation.  Patient denies nausea, vomiting, diarrhea, constipation.  Denies chest pain, shortness of breath.  Does endorse some weakness.  Extensive past medical history including chronic systolic congestive heart failure, AICD, history of transfusion, chronic diastolic CHF, chronic A-fib, COPD, end-stage renal disease on dialysis, cirrhosis of liver, secondary hypercoagulable state, long-term current use of anticoagulants, anemia due to chronic blood loss, type II DM, secondary esophageal varices, pulmonary hypertension, aortic atherosclerosis, history of lower GI bleed  HPI     Home Medications Prior to Admission medications   Medication Sig Start Date End Date Taking? Authorizing Provider  amiodarone (PACERONE) 200 MG tablet Take 1 tablet (200 mg total) by mouth daily. 07/27/20   Clegg, [name] D, NP  AURYXIA 1 GM 210 MG(Fe) tablet Take 420 mg by mouth 3 (three) times daily with meals. 08/11/21   [provider]  diazepam (VALIUM) 5 MG tablet 1 tab po bid prn anxiety 10/20/21   Saguier, Percell Miller, PA-C  ELIQUIS 5 MG TABS tablet Take 1 tablet by mouth twice daily 10/10/21   [name], MD  gabapentin (NEURONTIN) 300 MG capsule Take 1 capsule (300 mg total) by mouth daily. 07/12/20   Lyda Jester M, PA-C  isosorbide-hydrALAZINE (BIDIL)  20-37.5 MG tablet Take 0.5 tablets by mouth 3 (three) times daily. 10/21/20   [name], MD  levothyroxine (SYNTHROID) 137 MCG tablet Take 1 tablet (137 mcg total) by mouth daily before breakfast. 03/11/20   Billie Ruddy, MD  lidocaine-prilocaine (EMLA) cream 1 application. 3 (three) times a week. 09/08/21   [provider]  traZODone (DESYREL) 50 MG tablet Take 0.5-1 tablets (25-50 mg total) by mouth at bedtime as needed for sleep. 12/16/20   Saguier, Percell Miller, PA-C  XIIDRA 5 % SOLN Place 1 drop into both eyes 2 (two) times daily. 08/29/21   [provider]      Allergies    Patient has no known allergies.    Review of Systems   Review of Systems  Constitutional:  Negative for fever.  Respiratory:  Negative for shortness of breath.   Cardiovascular:  Negative for chest pain.  Gastrointestinal:  Positive for abdominal pain and blood in stool. Negative for constipation, diarrhea, nausea and vomiting.  Genitourinary:  Negative for dysuria.  Skin:  Negative for pallor.  Neurological:  Negative for weakness and light-headedness.    Physical Exam Updated Vital Signs BP 126/64   Pulse 93   Temp 98.3 F (36.8 C) (Oral)   Resp 18   Ht '4\' 11"'$  (1.499 m)   Wt 57.2 kg   SpO2 96%   BMI 25.45 kg/m  Physical Exam Vitals and nursing note reviewed.  Constitutional:      General: She is not in acute distress.    Appearance: She is normal weight.  HENT:     Head: Normocephalic and atraumatic.     Mouth/Throat:     Mouth: Mucous membranes are moist.  Eyes:     Conjunctiva/sclera: Conjunctivae normal.  Cardiovascular:     Rate and Rhythm: Normal rate. Rhythm irregular.     Pulses: Normal pulses.     Heart sounds: Normal heart sounds.  Pulmonary:     Effort: Pulmonary effort is normal.     Breath sounds: Normal breath sounds.  Abdominal:     Palpations: Abdomen is soft.     Tenderness: There is abdominal tenderness.     Comments: LLQ tenderness  Genitourinary:     Rectum: Guaiac result positive.  Musculoskeletal:     Cervical back: Normal range of motion and neck supple.     Right lower leg: No edema.     Left lower leg: No edema.  Skin:    General: Skin is warm and dry.     Capillary Refill: Capillary refill takes less than 2 seconds.  Neurological:     Mental Status: She is alert.     ED Results / Procedures / Treatments   Labs (all labs ordered are listed, but only abnormal results are displayed) Labs Reviewed  COMPREHENSIVE METABOLIC PANEL - Abnormal; Notable for the following components:      Result Value   Chloride 94 (*)    CO2 33 (*)    Glucose, Bld 106 (*)    Creatinine, Ser 3.74 (*)    Calcium 8.2 (*)    GFR, Estimated 12 (*)    All other components within normal limits  CBC WITH DIFFERENTIAL/PLATELET - Abnormal; Notable for the following components:   RBC 3.15 (*)    Hemoglobin 9.9 (*)    HCT 29.6 (*)    RDW 16.7 (*)    Platelets 96 (*)    All other components within normal limits  OCCULT BLOOD X 1 CARD TO LAB, STOOL - Abnormal; Notable for the following components:   Fecal Occult Bld POSITIVE (*)    All other components within normal limits  HEMOGLOBIN AND HEMATOCRIT, BLOOD - Abnormal; Notable for the following components:   Hemoglobin 8.9 (*)    HCT 27.3 (*)    All other components within normal limits  CBG MONITORING, ED - Abnormal; Notable for the following components:   Glucose-Capillary 108 (*)    All other components within normal limits  POC OCCULT BLOOD, ED    EKG None  Radiology CT ABDOMEN PELVIS W CONTRAST  Result Date: 10/24/2021 CLINICAL DATA:  LLQ abdominal pain--patient has been on dialysis for a year and a half- EXAM: CT ABDOMEN AND PELVIS WITH CONTRAST TECHNIQUE: Multidetector CT imaging of the abdomen and pelvis was performed using the standard protocol following bolus administration of intravenous contrast. RADIATION DOSE REDUCTION: This exam was performed according to the departmental  dose-optimization program which includes automated exposure control, adjustment of the mA and/or kV according to patient size and/or use of iterative reconstruction technique. CONTRAST:  16m OMNIPAQUE IOHEXOL 300 MG/ML  SOLN COMPARISON:  None Available. FINDINGS: Lower chest: Cardiac device leads partially visualized. No acute abnormality. Hepatobiliary: Nodular hepatic contour. The hepatic parenchyma is diffusely hypodense compared to the splenic parenchyma consistent with fatty infiltration. No focal liver abnormality. No gallstones, gallbladder wall thickening, or pericholecystic fluid. No biliary dilatation. Pancreas: No focal lesion. Normal pancreatic contour. No surrounding inflammatory changes. No main pancreatic ductal dilatation. Spleen: Normal in size without focal abnormality. Adrenals/Urinary Tract: No adrenal nodule  bilaterally. Bilateral kidneys enhance symmetrically. No hydronephrosis. No hydroureter. The urinary bladder is unremarkable. Stomach/Bowel: Query partial colectomy. Stomach is within normal limits. No evidence of bowel wall thickening or dilatation. Status post appendectomy Vascular/Lymphatic: Prominent inferior vena cava with retrograde reflux of contrast from the right atrium. No abdominal aorta or iliac aneurysm. Mild to moderate atherosclerotic plaque of the aorta and its branches. No abdominal, pelvic, or inguinal lymphadenopathy. Reproductive: Several calcified lesions within the urine walls measuring to 5.2 cm. Bilateral adnexa are unremarkable. Other: No intraperitoneal free fluid. No intraperitoneal free gas. No organized fluid collection. Musculoskeletal: Likely trace free fluid containing left inguinal hernia (2:66, 5:64). No suspicious lytic or blastic osseous lesions. No acute displaced fracture. Multilevel degenerative changes of the spine. IMPRESSION: 1. Cirrhosis and hepatic steatosis with no definite findings of portal hypertension. No focal liver lesions identified.  Please note that liver protocol enhanced MR and CT are the most sensitive tests for the screening detection of hepatocellular carcinoma in the high risk setting of cirrhosis. 2. Likely trace free fluid containing left inguinal hernia 3. Uterine fibroids. 4. Prominent inferior vena cava with retrograde reflux of contrast from the right atrium. Query right heart strain. 5.  Aortic Atherosclerosis (ICD10-I70.0). Electronically Signed   By: Iven Finn M.D.   On: 10/24/2021 22:08    Procedures Procedures    Medications Ordered in ED Medications  iohexol (OMNIPAQUE) 300 MG/ML solution 100 mL (100 mLs Intravenous Contrast Given 10/24/21 2143)    ED Course/ Medical Decision Making/ A&P Clinical Course as of 10/25/21 0025  Mon Oct 24, 2021  2336 This is a 76 year old female with a history of A-fib on Eliquis presenting to ED with dark stools, that began in the past 24 hours.  She reported some cramping left lower sided abdominal pain.  The pain is eased up.  Her blood work today was reassuring, white blood cell count normal, hemoglobin at baseline levels at 9.9, CT scan with no acute findings suggest diverticulitis.  She has Hemoccult positive.  She was observed for approximately 3 hours and we will recheck a hemoglobin hematocrit.  If there is no significant drop, she would prefer to follow-up as an outpatient with her PCP, and states that she can contact them about repeating an outpatient hemoglobin.  I think this would be reasonable.  I would have her hold her Eliquis tonight and tomorrow.  Both the patient and her daughter at bedside verbalized understanding and agreement.  We did discuss the alternative of observation in the hospital, but she would prefer not to.  We will however follow-up on the hemoglobin, if there is a significant drop, I would recommend again hospitalization. [MT]  2343 Repeat hemoglobin dropped, we will admit the patient to observation instead. [MT]    Clinical Course User  Index [MT] Trifan, Carola Rhine, MD                           Medical Decision Making Amount and/or Complexity of Data Reviewed Labs: ordered.   This patient presents to the ED for concern of rectal bleeding, this involves an extensive number of treatment options, and is a complaint that carries with it a high risk of complications and morbidity.  The differential diagnosis includes diverticulitis, lower GI bleed, upper GI bleed, hemorrhoids, and others   Co morbidities that complicate the patient evaluation  History of lower GI bleeds, chronic anemia blood loss   Additional history obtained:  External records from outside source obtained and reviewed including planned EGD on July 18.  Gastroenterology visits with [facility] gastroenterology for hepatic cirrhosis   Lab Tests:  I Ordered, and personally interpreted labs.  The pertinent results include: Positive fecal occult blood test, hemoglobin 9.9, stable from 2 weeks ago at 9.8, creatinine 3.74   Imaging Studies ordered:  I ordered imaging studies including CT abdomen pelvis I independently visualized and interpreted imaging which showed  1. Cirrhosis and hepatic steatosis with no definite findings of portal hypertension. No focal liver lesions identified. Please note that liver protocol enhanced MR and CT are the most sensitive tests for the screening detection of hepatocellular carcinoma in the high risk setting of cirrhosis. 2. Likely trace free fluid containing left inguinal hernia 3. Uterine fibroids. 4. Prominent inferior vena cava with retrograde reflux of contrast from the right atrium. Query right heart strain. 5. Aortic Atherosclerosi  I agree with the radiologist interpretation  Consultations Obtained:  I requested consultation with the hospitalist, Dr.Howerter and discussed lab and imaging findings as well as pertinent plan - they recommend: admission   Test / Admission - Considered:  Patient has a GI  bleed of unknown origin at this time. Considered discharge home depending on stability of hemoglobin. Repeat H and H showed 1 point hemoglobin drop. After discussion with attending, Dr.Trifan, plan to request admission for further evaluation and observation. Messaged on call GI to add to rounding list. Admit to hospital.          Final Clinical Impression(s) / ED Diagnoses Final diagnoses:  Acute GI bleeding  Anemia, unspecified type    Rx / DC Orders ED Discharge Orders     None         Ronny Bacon 10/25/21 0026    Wyvonnia Dusky, MD 10/25/21 1021

## 2021-10-24 NOTE — ED Triage Notes (Signed)
Pt states bright red bleeding with clots from rectum that started this afternoon  Also reports Shaking that started at dialysis today  H/o dialysis (M,W,F), and on eliquis

## 2021-10-25 ENCOUNTER — Telehealth: Payer: Self-pay | Admitting: Internal Medicine

## 2021-10-25 DIAGNOSIS — N186 End stage renal disease: Secondary | ICD-10-CM | POA: Diagnosis not present

## 2021-10-25 DIAGNOSIS — K5730 Diverticulosis of large intestine without perforation or abscess without bleeding: Secondary | ICD-10-CM | POA: Diagnosis not present

## 2021-10-25 DIAGNOSIS — K648 Other hemorrhoids: Secondary | ICD-10-CM | POA: Diagnosis not present

## 2021-10-25 DIAGNOSIS — Z992 Dependence on renal dialysis: Secondary | ICD-10-CM

## 2021-10-25 DIAGNOSIS — D696 Thrombocytopenia, unspecified: Secondary | ICD-10-CM | POA: Diagnosis not present

## 2021-10-25 DIAGNOSIS — E038 Other specified hypothyroidism: Secondary | ICD-10-CM

## 2021-10-25 DIAGNOSIS — Z7901 Long term (current) use of anticoagulants: Secondary | ICD-10-CM | POA: Diagnosis not present

## 2021-10-25 DIAGNOSIS — E039 Hypothyroidism, unspecified: Secondary | ICD-10-CM | POA: Diagnosis not present

## 2021-10-25 DIAGNOSIS — K625 Hemorrhage of anus and rectum: Secondary | ICD-10-CM | POA: Diagnosis present

## 2021-10-25 DIAGNOSIS — Z95810 Presence of automatic (implantable) cardiac defibrillator: Secondary | ICD-10-CM

## 2021-10-25 DIAGNOSIS — Z79899 Other long term (current) drug therapy: Secondary | ICD-10-CM | POA: Diagnosis not present

## 2021-10-25 DIAGNOSIS — I482 Chronic atrial fibrillation, unspecified: Secondary | ICD-10-CM | POA: Diagnosis not present

## 2021-10-25 DIAGNOSIS — J449 Chronic obstructive pulmonary disease, unspecified: Secondary | ICD-10-CM

## 2021-10-25 DIAGNOSIS — I4819 Other persistent atrial fibrillation: Secondary | ICD-10-CM | POA: Diagnosis not present

## 2021-10-25 DIAGNOSIS — K7469 Other cirrhosis of liver: Secondary | ICD-10-CM

## 2021-10-25 DIAGNOSIS — I428 Other cardiomyopathies: Secondary | ICD-10-CM

## 2021-10-25 DIAGNOSIS — K7030 Alcoholic cirrhosis of liver without ascites: Secondary | ICD-10-CM

## 2021-10-25 DIAGNOSIS — E1122 Type 2 diabetes mellitus with diabetic chronic kidney disease: Secondary | ICD-10-CM | POA: Diagnosis not present

## 2021-10-25 DIAGNOSIS — D62 Acute posthemorrhagic anemia: Secondary | ICD-10-CM | POA: Diagnosis not present

## 2021-10-25 DIAGNOSIS — K7460 Unspecified cirrhosis of liver: Secondary | ICD-10-CM | POA: Diagnosis not present

## 2021-10-25 DIAGNOSIS — I132 Hypertensive heart and chronic kidney disease with heart failure and with stage 5 chronic kidney disease, or end stage renal disease: Secondary | ICD-10-CM | POA: Diagnosis not present

## 2021-10-25 DIAGNOSIS — I959 Hypotension, unspecified: Secondary | ICD-10-CM | POA: Diagnosis present

## 2021-10-25 DIAGNOSIS — J441 Chronic obstructive pulmonary disease with (acute) exacerbation: Secondary | ICD-10-CM | POA: Diagnosis not present

## 2021-10-25 DIAGNOSIS — B1920 Unspecified viral hepatitis C without hepatic coma: Secondary | ICD-10-CM

## 2021-10-25 DIAGNOSIS — Z980 Intestinal bypass and anastomosis status: Secondary | ICD-10-CM | POA: Diagnosis not present

## 2021-10-25 DIAGNOSIS — K922 Gastrointestinal hemorrhage, unspecified: Secondary | ICD-10-CM | POA: Diagnosis not present

## 2021-10-25 DIAGNOSIS — J45909 Unspecified asthma, uncomplicated: Secondary | ICD-10-CM | POA: Diagnosis not present

## 2021-10-25 DIAGNOSIS — K3189 Other diseases of stomach and duodenum: Secondary | ICD-10-CM | POA: Diagnosis not present

## 2021-10-25 DIAGNOSIS — I5022 Chronic systolic (congestive) heart failure: Secondary | ICD-10-CM | POA: Diagnosis not present

## 2021-10-25 DIAGNOSIS — Z8616 Personal history of COVID-19: Secondary | ICD-10-CM | POA: Diagnosis not present

## 2021-10-25 LAB — CBC WITH DIFFERENTIAL/PLATELET
Abs Immature Granulocytes: 0.02 10*3/uL (ref 0.00–0.07)
Basophils Absolute: 0 10*3/uL (ref 0.0–0.1)
Basophils Relative: 1 %
Eosinophils Absolute: 0.2 10*3/uL (ref 0.0–0.5)
Eosinophils Relative: 3 %
HCT: 27.5 % — ABNORMAL LOW (ref 36.0–46.0)
Hemoglobin: 9 g/dL — ABNORMAL LOW (ref 12.0–15.0)
Immature Granulocytes: 0 %
Lymphocytes Relative: 29 %
Lymphs Abs: 1.5 10*3/uL (ref 0.7–4.0)
MCH: 30.5 pg (ref 26.0–34.0)
MCHC: 32.7 g/dL (ref 30.0–36.0)
MCV: 93.2 fL (ref 80.0–100.0)
Monocytes Absolute: 0.5 10*3/uL (ref 0.1–1.0)
Monocytes Relative: 11 %
Neutro Abs: 2.9 10*3/uL (ref 1.7–7.7)
Neutrophils Relative %: 56 %
Platelets: 94 10*3/uL — ABNORMAL LOW (ref 150–400)
RBC: 2.95 MIL/uL — ABNORMAL LOW (ref 3.87–5.11)
RDW: 16.8 % — ABNORMAL HIGH (ref 11.5–15.5)
WBC: 5.1 10*3/uL (ref 4.0–10.5)
nRBC: 0 % (ref 0.0–0.2)

## 2021-10-25 LAB — RENAL FUNCTION PANEL
Albumin: 3.6 g/dL (ref 3.5–5.0)
Anion gap: 9 (ref 5–15)
BUN: 11 mg/dL (ref 8–23)
CO2: 33 mmol/L — ABNORMAL HIGH (ref 22–32)
Calcium: 7.8 mg/dL — ABNORMAL LOW (ref 8.9–10.3)
Chloride: 95 mmol/L — ABNORMAL LOW (ref 98–111)
Creatinine, Ser: 4.28 mg/dL — ABNORMAL HIGH (ref 0.44–1.00)
GFR, Estimated: 10 mL/min — ABNORMAL LOW (ref >60)
Glucose, Bld: 75 mg/dL (ref 70–99)
Phosphorus: 2.7 mg/dL (ref 2.5–4.6)
Potassium: 3.9 mmol/L (ref 3.5–5.1)
Sodium: 137 mmol/L (ref 135–145)

## 2021-10-25 LAB — HEPATITIS B SURFACE ANTIGEN: Hepatitis B Surface Ag: NONREACTIVE

## 2021-10-25 LAB — TYPE AND SCREEN
ABO/RH(D): O POS
Antibody Screen: NEGATIVE

## 2021-10-25 LAB — HEMOGLOBIN AND HEMATOCRIT, BLOOD
HCT: 29.3 % — ABNORMAL LOW (ref 36.0–46.0)
Hemoglobin: 9.7 g/dL — ABNORMAL LOW (ref 12.0–15.0)

## 2021-10-25 LAB — HEPATITIS C ANTIBODY: HCV Ab: REACTIVE — AB

## 2021-10-25 LAB — HEPATITIS B SURFACE ANTIBODY,QUALITATIVE: Hep B S Ab: REACTIVE — AB

## 2021-10-25 MED ORDER — TRAZODONE HCL 50 MG PO TABS
50.0000 mg | ORAL_TABLET | Freq: Every evening | ORAL | Status: DC | PRN
Start: 2021-10-25 — End: 2021-10-27
  Administered 2021-10-26: 50 mg via ORAL
  Filled 2021-10-25: qty 1

## 2021-10-25 MED ORDER — ONDANSETRON HCL 4 MG/2ML IJ SOLN: 4.0000 mg | Freq: Four times a day (QID) | INTRAMUSCULAR | Status: DC | PRN

## 2021-10-25 MED ORDER — BISACODYL 5 MG PO TBEC
20.0000 mg | DELAYED_RELEASE_TABLET | Freq: Once | ORAL | Status: AC
  Administered 2021-10-25: 20 mg via ORAL
  Filled 2021-10-25: qty 4

## 2021-10-25 MED ORDER — GABAPENTIN 300 MG PO CAPS
300.0000 mg | ORAL_CAPSULE | Freq: Every day | ORAL | Status: DC
  Administered 2021-10-26: 300 mg via ORAL
  Filled 2021-10-25 (×3): qty 1

## 2021-10-25 MED ORDER — LEVOTHYROXINE SODIUM 25 MCG PO TABS
137.0000 ug | ORAL_TABLET | Freq: Every day | ORAL | Status: DC
  Administered 2021-10-26 – 2021-10-27 (×2): 137 ug via ORAL
  Filled 2021-10-25 (×2): qty 1

## 2021-10-25 MED ORDER — ACETAMINOPHEN 325 MG PO TABS: 650.0000 mg | ORAL_TABLET | Freq: Four times a day (QID) | ORAL | Status: DC | PRN

## 2021-10-25 MED ORDER — AMIODARONE HCL 200 MG PO TABS
200.0000 mg | ORAL_TABLET | Freq: Every day | ORAL | Status: DC
  Administered 2021-10-26 – 2021-10-27 (×2): 200 mg via ORAL
  Filled 2021-10-25 (×2): qty 1

## 2021-10-25 MED ORDER — SODIUM CHLORIDE 0.9 % IV SOLN
1.0000 g | INTRAVENOUS | Status: DC
  Administered 2021-10-25 – 2021-10-26 (×2): 1 g via INTRAVENOUS
  Filled 2021-10-25 (×2): qty 10

## 2021-10-25 MED ORDER — PEG-KCL-NACL-NASULF-NA ASC-C 100 G PO SOLR
1.0000 | Freq: Once | ORAL | Status: AC
  Administered 2021-10-25: 200 g via ORAL
  Filled 2021-10-25: qty 1

## 2021-10-25 MED ORDER — SODIUM CHLORIDE 0.9% FLUSH
3.0000 mL | Freq: Two times a day (BID) | INTRAVENOUS | Status: DC
  Administered 2021-10-25 – 2021-10-27 (×5): 3 mL via INTRAVENOUS

## 2021-10-25 MED ORDER — CHLORHEXIDINE GLUCONATE CLOTH 2 % EX PADS
6.0000 | MEDICATED_PAD | Freq: Every day | CUTANEOUS | Status: DC
  Administered 2021-10-26 – 2021-10-27 (×2): 6 via TOPICAL

## 2021-10-25 MED ORDER — ONDANSETRON HCL 4 MG PO TABS: 4.0000 mg | ORAL_TABLET | Freq: Four times a day (QID) | ORAL | Status: DC | PRN

## 2021-10-25 MED ORDER — ACETAMINOPHEN 650 MG RE SUPP: 650.0000 mg | Freq: Four times a day (QID) | RECTAL | Status: DC | PRN

## 2021-10-25 MED ORDER — DIAZEPAM 5 MG PO TABS: 5.0000 mg | ORAL_TABLET | Freq: Two times a day (BID) | ORAL | Status: DC | PRN

## 2021-10-25 MED ORDER — LIDOCAINE-PRILOCAINE 2.5-2.5 % EX CREA
1.0000 | TOPICAL_CREAM | CUTANEOUS | Status: DC
Start: 2021-10-26 — End: 2021-10-27
  Filled 2021-10-25 (×2): qty 5

## 2021-10-25 MED ORDER — ALBUTEROL SULFATE (2.5 MG/3ML) 0.083% IN NEBU: 2.5000 mg | INHALATION_SOLUTION | Freq: Four times a day (QID) | RESPIRATORY_TRACT | Status: DC | PRN

## 2021-10-25 NOTE — ED Notes (Signed)
Report provided to [facility] for tranport

## 2021-10-25 NOTE — Consult Note (Signed)
[facility] Renal Consultation Note  Requesting MD: Fuller Plan, MD Indication for Consultation:  ESRD  Chief complaint: blood per rectum  HPI:  Morgan Clay is a 76 y.o. female with a history including ESRD on HD MWF, AICD in situ, small bowel AVM, afib, cirrhosis, HTN, and DM who presented to the hospital with blood per rectum.  GI was consulted and plans for EGD tomorrow.  She did go to HD Monday and signed off just a few minutes early as she didn't feel well and had started shaking.  She got to 57.8 kg and usually meets her EDW of 57.5 kg or gets lower. She feels ok today.   PMHx:   Past Medical History:  Diagnosis Date   Acute blood loss anemia 09/24/2020   AICD (automatic cardioverter/defibrillator) present 10/06/2015   Anemia due to chronic blood loss 04/15/2020   Anemia in chronic kidney disease 01/18/2018   Angiodysplasia of small intestine 06/10/2020   Ileum - seen on capsule endoscopy 05/2020 - ablated at colonoscopy   Aortic atherosclerosis ([place]) 08/30/2017   Arthritis    "qwhere" (01/03/2018)   Asthma    reports mild asthma since childhood - had COPD on dx list from prior PCP   Atrial fibrillation with RVR ([place]) 11/17/2020   Atrial fibrillation, chronic 10/25/2017   Atypical atrial flutter ([place]) 09/28/2015   AVM (arteriovenous malformation) of small bowel, acquired 01/14/2018   Biventricular ICD (implantable cardioverter-defibrillator) in place 01/08/2007   Qualifier: Diagnosis of  By: Hassell Done FNP, Nykedtra     Bleeding pseudoaneurysm of left brachiocephalic arteriovenous fistula ([place]) 09/24/2020   Bleeding pseudoaneurysm of left brachiocephalic AV fistula ([place]) 09/24/2020   Carpal tunnel syndrome, bilateral 06/09/2021   Chronic bronchitis ([place])    "get it most years; not this past year though" (01/03/2018)   Chronic diastolic CHF (congestive heart failure) ([place]) 04/24/2007   Annotation: secondary to nonischemic cardiomyopathy s/p CRT-D Followed by  Dr. Caryl Comes in Cardiology Qualifier: Diagnosis of  By: Hassell Done FNP, Nykedtra     Chronic obstructive pulmonary disease ([place])    Chronic systolic congestive heart failure ([place])    CKD (chronic kidney disease) stage 4, GFR 15-29 ml/min  11/24/2013   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 (HCV) ([place])    HEPATITIS C - s/p treatment with Harvoni, saw hepatology, Dawn Drazek   COVID-19 virus infection 05/27/2020   Diabetes mellitus without complication ([place])    DIET CONTROLLED    Dialysis complication    Diverticulitis of left colon status post robotic low anterior to sigmoid resection 05/22/2018 07/31/2017   End stage renal disease ([place]) 03/22/2021   FIBROIDS, UTERUS 03/05/2008   Glaucoma    Gout    History of blood transfusion ~ 11/2017   Hypertension associated with diabetes ([place]) 06/07/2017   Hypothyroidism    LBBB (left bundle branch block)    Lesion of ulnar nerve 06/09/2021   Long term current use of anticoagulant    Lower GI bleeding    "been dealing w/it since 07/2017" (01/03/2018)   Malnutrition of moderate degree 07/09/2020   MDD (major depressive disorder), recurrent, in partial remission ([place]) 06/07/2017   Nonischemic cardiomyopathy ([place])    Obesity 05/27/2009   Qualifier: Diagnosis of  By: Hassell Done FNP, Nykedtra     OBSTRUCTIVE SLEEP APNEA 11/14/2007   no CPAP   OSTEOPENIA 09/30/2008   Other cirrhosis of liver ([place]) 08/12/2020   Formatting of this note is different from the original. Patient with history of cirrhosis secondary  to hepatitis C with no history of hepatic decompensation.  She has had some hyponatremia and hypoalbuminemia in the past however most recent labs show relatively well-preserved liver synthetic function.  She will have labs to update liver synthetic function.  Hepatoma screening - patient is currentl   Overweight (BMI 25.0-29.9) 03/02/2020   Pain in right knee 05/13/2018   Persistent atrial fibrillation ([place]) 02/06/2020   Pneumonia    "several  times" (01/03/2018)   Polyneuropathy associated with underlying disease ([place]) 06/09/2021   Pulmonary hypertension ([place]) on echocardiogram 01/14/2018   Secondary esophageal varices without bleeding ([place]) 12/23/2020   Formatting of this note might be different from the original. Patient had trace esophageal varices during small bowel enteroscopy in October 2021, follow-up EGD in January 2022 with no varices.  She previously had GI bleeding thought to be from AVM versus GAVE.  She is no longer on carvedilol and will need surveillance endoscopy.  Patient was previously followed at [place] and I have asked her    Secondary hypercoagulable state ([place]) 02/06/2020   Type 2 diabetes, controlled, with renal manifestation ([place]) 11/24/2013    Past Surgical History:  Procedure Laterality Date   A/V FISTULAGRAM N/A 03/28/2021   Procedure: A/V FISTULAGRAM;  Surgeon: Waynetta Sandy, MD;  Location: [place] [facility];  Service: Cardiovascular;  Laterality: N/A;   APPENDECTOMY     AV FISTULA PLACEMENT Left 08/05/2020   Procedure: LEFT UPPER EXTREMITY ARTERIOVENOUS (AV) FISTULA CREATION;  Surgeon: Cherre Robins, MD;  Location: [place];  Service: Vascular;  Laterality: Left;   [place] Left 09/16/2020   Procedure: LEFT SECOND STAGE [place];  Surgeon: Cherre Robins, MD;  Location: [place];  Service: Vascular;  Laterality: Left;  PERIPHERAL NERVE BLOCK   CARDIAC CATHETERIZATION     CARDIOVERSION N/A 12/14/2014   Procedure: CARDIOVERSION;  Surgeon: Dorothy Spark, MD;  Location: [place];  Service: Cardiovascular;  Laterality: N/A;   CARDIOVERSION N/A 02/26/2017   Procedure: CARDIOVERSION;  Surgeon: Evans Lance, MD;  Location: [place] [facility];  Service: Cardiovascular;  Laterality: N/A;   CARDIOVERSION N/A 07/24/2017   Procedure: CARDIOVERSION;  Surgeon: Acie Fredrickson Wonda Cheng, MD;  Location: [facility] ENDOSCOPY;  Service: Cardiovascular;  Laterality: N/A;    CARDIOVERSION N/A 02/12/2020   Procedure: CARDIOVERSION;  Surgeon: [name], MD;  Location: [facility] ENDOSCOPY;  Service: Cardiovascular;  Laterality: N/A;   CARDIOVERSION N/A 09/15/2021   Procedure: CARDIOVERSION;  Surgeon: [name], MD;  Location: [facility] ENDOSCOPY;  Service: Cardiovascular;  Laterality: N/A;   CARPAL TUNNEL RELEASE  07/21/2021   COLONOSCOPY N/A 11/08/2017   Procedure: COLONOSCOPY;  Surgeon: Milus Banister, MD;  Location: [facility] ENDOSCOPY;  Service: Endoscopy;  Laterality: N/A;   COLONOSCOPY WITH PROPOFOL N/A 05/31/2020   Procedure: COLONOSCOPY WITH PROPOFOL;  Surgeon: Irene Shipper, MD;  Location: [facility] ENDOSCOPY;  Service: Endoscopy;  Laterality: N/A;   DILATION AND CURETTAGE OF UTERUS     ENTEROSCOPY N/A 03/02/2020   Procedure: ENTEROSCOPY;  Surgeon: Yetta Flock, MD;  Location: [facility] ENDOSCOPY;  Service: Gastroenterology;  Laterality: N/A;   EP IMPLANTABLE DEVICE N/A 10/06/2015   Procedure: BIV ICD Generator Changeout;  Surgeon: [name], MD;  Location: [place] [facility];  Service: Cardiovascular;  Laterality: N/A;   ESOPHAGOGASTRODUODENOSCOPY N/A 10/26/2017   Procedure: ESOPHAGOGASTRODUODENOSCOPY (EGD);  Surgeon: Ladene Artist, MD;  Location[facility];  Service: Endoscopy;  Laterality: N/A;   ESOPHAGOGASTRODUODENOSCOPY (EGD) WITH PROPOFOL N/A 11/07/2017  Procedure: ESOPHAGOGASTRODUODENOSCOPY (EGD) WITH PROPOFOL;  Surgeon: Milus Banister, MD;  [facility];  Service: Endoscopy;  Laterality: N/A;   ESOPHAGOGASTRODUODENOSCOPY (EGD) WITH PROPOFOL N/A 05/29/2020   Procedure: ESOPHAGOGASTRODUODENOSCOPY (EGD) WITH PROPOFOL;  Surgeon: Doran Stabler, MD;  Location: [place];  Service: Gastroenterology;  Laterality: N/A;   GIVENS CAPSULE STUDY N/A 05/19/2020   Procedure: GIVENS CAPSULE STUDY;  Surgeon: [name], MD;  Location: [place];  Service: Endoscopy;  Laterality: N/A;  .adm for obs since pacemaker, PA wil enter order and see  pt   GIVENS CAPSULE STUDY N/A 05/29/2020   Procedure: GIVENS CAPSULE STUDY;  Surgeon: Doran Stabler, MD;  Location: [place];  Service: Gastroenterology;  Laterality: N/A;   HEMATOMA EVACUATION Left 09/24/2020   Procedure: EVACUATION HEMATOMA ARM;  Surgeon: Rosetta Posner, MD;  Location[facility] OR;  Service: Vascular;  Laterality: Left;   HOT HEMOSTASIS N/A 10/26/2017   Procedure: HOT HEMOSTASIS (ARGON PLASMA COAGULATION/BICAP);  Surgeon: Ladene Artist, MD;  Location: [facility];  Service: Endoscopy;  Laterality: N/A;   HOT HEMOSTASIS N/A 03/02/2020   Procedure: HOT HEMOSTASIS (ARGON PLASMA COAGULATION/BICAP);  Surgeon: Yetta Flock, MD;  Location: [facility] ENDOSCOPY;  Service: Gastroenterology;  Laterality: N/A;   HOT HEMOSTASIS N/A 05/31/2020   Procedure: HOT HEMOSTASIS (ARGON PLASMA COAGULATION/BICAP);  Surgeon: Irene Shipper, MD;  Location: [facility] ENDOSCOPY;  Service: Endoscopy;  Laterality: N/A;   INSERT / REPLACE / REMOVE PACEMAKER  ?2008   IR PERC TUN PERIT CATH WO PORT S&I /IMAG  07/05/2020   PERIPHERAL VASCULAR INTERVENTION Left 03/28/2021   Procedure: PERIPHERAL VASCULAR INTERVENTION;  Surgeon: Waynetta Sandy, MD;  Location: [place] [facility];  Service: Cardiovascular;  Laterality: Left;   POLYPECTOMY  11/08/2017   Procedure: POLYPECTOMY;  Surgeon: Milus Banister, MD;  [facility];  Service: Endoscopy;;   PROCTOSCOPY N/A 05/22/2018   Procedure: RIGID PROCTOSCOPY;  Surgeon: Michael Boston, MD;  [facility];  Service: General;  Laterality: N/A;   RIGHT HEART CATH N/A 02/13/2018   Procedure: RIGHT HEART CATH;  Surgeon: [name], MD;  Location: [place] [facility];  Service: Cardiovascular;  Laterality: N/A;   RIGHT HEART CATH N/A 06/23/2020   Procedure: RIGHT HEART CATH;  Surgeon: [name], MD;  Location: [place] [facility];  Service: Cardiovascular;  Laterality: N/A;   RIGHT/LEFT HEART CATH AND CORONARY ANGIOGRAPHY N/A 12/11/2019    Procedure: RIGHT/LEFT HEART CATH AND CORONARY ANGIOGRAPHY;  Surgeon: [name], MD;  Location: [place] [facility];  Service: Cardiovascular;  Laterality: N/A;   SUBMUCOSAL TATTOO INJECTION  03/02/2020   Procedure: SUBMUCOSAL TATTOO INJECTION;  Surgeon: Yetta Flock, MD;  Location: [facility]COPY;  Service: Gastroenterology;;   SUBMUCOSAL TATTOO INJECTION  05/31/2020   Procedure: SUBMUCOSAL TATTOO INJECTION;  Surgeon: Irene Shipper, MD;  Location: [place];  Service: Endoscopy;;   TEE WITHOUT CARDIOVERSION N/A 08/02/2021   Procedure: TRANSESOPHAGEAL ECHOCARDIOGRAM (TEE);  Surgeon: [name], MD;  Location: [facility];  Service: Cardiovascular;  Laterality: N/A;   TUBAL LIGATION     XI ROBOTIC ASSISTED LOWER ANTERIOR RESECTION N/A 05/22/2018   Procedure: XI ROBOTIC ASSISTED SIGMOID COLOECTOMY MOBILIZATION OF SPLENIC FLEXURE, FIREFLY ASSESSMENT OF PERFUSION;  Surgeon: Michael Boston, MD;  [facility];  Service: General;  Laterality: N/A;  ERAS PATHWAY    Family Hx:  Family History  Problem Relation Age of Onset   Asthma Father    Heart attack Father  Asthma Sister    Lung cancer Sister    Heart attack Mother    Stroke Brother    Colon cancer Neg Hx    Esophageal cancer Neg Hx    Pancreatic cancer Neg Hx    Stomach cancer Neg Hx    Liver disease Neg Hx   Denies any family hx of ESRD  Social History:  reports that she has never smoked. She has never used smokeless tobacco. She reports that she does not currently use alcohol. She reports that she does not currently use drugs after having used the following drugs: Marijuana.  Allergies: No Known Allergies  Medications: Prior to Admission medications   Medication Sig Start Date End Date Taking? Authorizing Provider  amiodarone (PACERONE) 200 MG tablet Take 1 tablet (200 mg total) by mouth daily. 07/27/20  Yes Clegg, [name] D, NP  diazepam (VALIUM) 5 MG tablet 1 tab po bid prn anxiety Patient taking  differently: Take 5 mg by mouth 2 (two) times daily as needed for anxiety. 10/20/21  Yes Saguier, Percell Miller, PA-C  ELIQUIS 5 MG TABS tablet Take 1 tablet by mouth twice daily Patient taking differently: Take 5 mg by mouth 2 (two) times daily. 10/10/21  Yes [name], MD  gabapentin (NEURONTIN) 300 MG capsule Take 1 capsule (300 mg total) by mouth daily. 07/12/20  Yes Simmons, Brittainy M, PA-C  isosorbide-hydrALAZINE (BIDIL) 20-37.5 MG tablet Take 0.5 tablets by mouth 3 (three) times daily. 10/21/20  Yes [name], MD  levothyroxine (SYNTHROID) 137 MCG tablet Take 1 tablet (137 mcg total) by mouth daily before breakfast. 03/11/20  Yes Billie Ruddy, MD  lidocaine-prilocaine (EMLA) cream 1 application. 3 (three) times a week. 09/08/21  Yes [provider]  traZODone (DESYREL) 50 MG tablet Take 0.5-1 tablets (25-50 mg total) by mouth at bedtime as needed for sleep. Patient taking differently: Take 50 mg by mouth at bedtime as needed for sleep. 12/16/20  Yes Saguier, Percell Miller, PA-C  AURYXIA 1 GM 210 MG(Fe) tablet Take 420 mg by mouth 3 (three) times daily with meals. Patient not taking: Reported on 10/25/2021 08/11/21   [provider]    I have reviewed the patient's current and reported prior to admission medications.  Labs:     Latest Ref Rng & Units 10/25/2021    4:10 AM 10/24/2021    8:40 PM 09/15/2021    1:25 PM  BMP  Glucose 70 - 99 mg/dL 75  106  83   BUN 8 - 23 mg/dL '11  10  8   '$ Creatinine 0.44 - 1.00 mg/dL 4.28  3.74  5.20   Sodium 135 - 145 mmol/L 137  138  136   Potassium 3.5 - 5.1 mmol/L 3.9  3.6  4.6   Chloride 98 - 111 mmol/L 95  94  97   CO2 22 - 32 mmol/L 33  33    Calcium 8.9 - 10.3 mg/dL 7.8  8.2       ROS:  Pertinent items noted in HPI and remainder of comprehensive ROS otherwise negative.  Physical Exam: Vitals:   10/25/21 1009 10/25/21 1130  BP: 110/62 123/70  Pulse: 88 90  Resp: 18 16  Temp:  98.2 F (36.8 C)  SpO2: 99% 95%     General:  adult female in bed in NAD  HEENT: NCAT Eyes: EOMI sclera anicteric Neck: supple trachea midline Heart: S1S2 no rub Lungs: clear and unlabored  Abdomen: soft/nt/nd Extremities: no edema lower extremities; no  cyanosis or clubbing Skin: no rash on extremities exposed Neuro: alert and oriented x 3 provides hx and follows commands Access LUE AVF bruit and thrill   Outpatient HD orders:  3.5 hours  BF 400 / DF 600  EDW 57.5 kg 3K/2.5 calcium AVF Last HD on 6/19 with post weight of 57.8 kg Meds: sensipar 30 mg three times a week with HD Mircera 75 mcg every 2 weeks - last given on 6/19 Hectorol 2 mcg three times a week with HD  Assessment/Plan:  # GI bleed - for EGD tomorrow  # ESRD - HD per MWF schedule - HD after EGD tomorrow   # Anemia - acute blood loss and chronic disease - appreciate GI - note recent ESA dose of 6/19  # HTN - controlled    # Metabolic bone disease  - sensipar and hectorol outpatient as above - continue when taking PO (will resume at outpatient HD unit)  Disposition - HD tomorrow after EGD then disposition per primary team   Claudia Desanctis 10/25/2021, 5:29 PM

## 2021-10-25 NOTE — H&P (Addendum)
History and Physical    Patient: Morgan Clay YOV:[id number] DOB: 09/28/1945 DOA: 10/24/2021 DOS: the patient was seen and examined on 10/25/2021 PCP: Mackie Pai, PA-C  Patient coming from: [facility] transfer  Chief Complaint:  Chief Complaint  Patient presents with   Rectal Bleeding   HPI: Morgan Clay is a 76 y.o. female with medical history significant of atrial on chronic anticoagulation, NICM s/p ICD last EF 60-65%, ESRD on HD (MWF) followed by Dr. Moshe Cipro, cirrhosis of the liver, GI bleed, hepatitis C s/p Harvoni treatment who presents for rectal bleeding.  Symptoms started yesterday afternoon after she had gone to hemodialysis.  She reports that she had felt weak during dialysis and had stopped a few minutes early.  After getting home patient reported having 4-5 episodes of bright red blood with clots present.  She is is on Eliquis and took the last dose yesterday morning, but is not on any NSAIDs or aspirin.  She denies having any abdominal pain related to symptoms.  She does not currently drink alcohol or smoke.  She has had no significant chest pain, loss of consciousness, chest pain, shortness of breath, or dysuria.  Records note patient had been admitted for GI bleed back in 02/2020 requiring transfusion of 2 units of packed red blood cells.  Patient found to have superficial AVMs on small bowel enteroscopy treated with APC.  Patient underwent video capsule endoscopy 05/29/2020 which showed small lymphangiectasia at the distal  duodenum/ proximal jejunum.  Then was noted to have a tiny nonbleeding AVM at the ileum treated with APC during last colonoscopy on 05/27/2020.  Upon admission to the emergency department patient was noted to be afebrile with blood pressures as low as 106/43, and O2 saturations transiently as low as 87%.  O2 saturation currently maintained on room air.  Labs since yesterday significant for hemoglobin 9.9-> 9, platelet count 96->94, and creatinine  3.74-> 4.28.  CT scan of the abdomen pelvis noted cirrhosis with hepatic steatosis without definitive portal hypertension trace free fluid containing left inguinal hernia, uterine fibroids, question possibility of right heart strain.  Review of Systems: As mentioned in the history of present illness. All other systems reviewed and are negative. Past Medical History:  Diagnosis Date   Acute blood loss anemia 09/24/2020   AICD (automatic cardioverter/defibrillator) present 10/06/2015   Anemia due to chronic blood loss 04/15/2020   Anemia in chronic kidney disease 01/18/2018   Angiodysplasia of small intestine 06/10/2020   Ileum - seen on capsule endoscopy 05/2020 - ablated at colonoscopy   Aortic atherosclerosis ([place]) 08/30/2017   Arthritis    "qwhere" (01/03/2018)   Asthma    reports mild asthma since childhood - had COPD on dx list from prior PCP   Atrial fibrillation with RVR ([place]) 11/17/2020   Atrial fibrillation, chronic 10/25/2017   Atypical atrial flutter ([place]) 09/28/2015   AVM (arteriovenous malformation) of small bowel, acquired 01/14/2018   Biventricular ICD (implantable cardioverter-defibrillator) in place 01/08/2007   Qualifier: Diagnosis of  By: Hassell Done FNP, Nykedtra     Bleeding pseudoaneurysm of left brachiocephalic arteriovenous fistula ([place]) 09/24/2020   Bleeding pseudoaneurysm of left brachiocephalic AV fistula ([place]) 09/24/2020   Carpal tunnel syndrome, bilateral 06/09/2021   Chronic bronchitis ([place])    "get it most years; not this past year though" (01/03/2018)   Chronic diastolic CHF (congestive heart failure) ([place]) 04/24/2007   Annotation: secondary to nonischemic cardiomyopathy s/p CRT-D Followed by Dr. Caryl Comes in Cardiology Qualifier: Diagnosis of  By: Hassell Done FNP, Tori Milks  Chronic obstructive pulmonary disease (HCC)    Chronic systolic congestive heart failure (HCC)    CKD (chronic kidney disease) stage 4, GFR 15-29 ml/min  11/24/2013   Compensated cirrhosis related  to hepatitis C virus (HCV) ([place])    HEPATITIS C - s/p treatment with Harvoni, saw hepatology, Dawn Drazek   COVID-19 virus infection 05/27/2020   Diabetes mellitus without complication ([place])    DIET CONTROLLED    Dialysis complication    Diverticulitis of left colon status post robotic low anterior to sigmoid resection 05/22/2018 07/31/2017   End stage renal disease ([place]) 03/22/2021   FIBROIDS, UTERUS 03/05/2008   Glaucoma    Gout    History of blood transfusion ~ 11/2017   Hypertension associated with diabetes ([place]) 06/07/2017   Hypothyroidism    LBBB (left bundle branch block)    Lesion of ulnar nerve 06/09/2021   Long term current use of anticoagulant    Lower GI bleeding    "been dealing w/it since 07/2017" (01/03/2018)   Malnutrition of moderate degree 07/09/2020   MDD (major depressive disorder), recurrent, in partial remission ([place]) 06/07/2017   Nonischemic cardiomyopathy ([place])    Obesity 05/27/2009   Qualifier: Diagnosis of  By: Hassell Done FNP, Nykedtra     OBSTRUCTIVE SLEEP APNEA 11/14/2007   no CPAP   OSTEOPENIA 09/30/2008   Other cirrhosis of liver ([place]) 08/12/2020   Formatting of this note is different from the original. Patient with history of cirrhosis secondary to hepatitis C with no history of hepatic decompensation.  She has had some hyponatremia and hypoalbuminemia in the past however most recent labs show relatively well-preserved liver synthetic function.  She will have labs to update liver synthetic function.  Hepatoma screening - patient is currentl   Overweight (BMI 25.0-29.9) 03/02/2020   Pain in right knee 05/13/2018   Persistent atrial fibrillation ([place]) 02/06/2020   Pneumonia    "several times" (01/03/2018)   Polyneuropathy associated with underlying disease ([place]) 06/09/2021   Pulmonary hypertension ([place]) on echocardiogram 01/14/2018   Secondary esophageal varices without bleeding ([place]) 12/23/2020   Formatting of this note might be different from the original.  Patient had trace esophageal varices during small bowel enteroscopy in October 2021, follow-up EGD in January 2022 with no varices.  She previously had GI bleeding thought to be from AVM versus GAVE.  She is no longer on carvedilol and will need surveillance endoscopy.  Patient was previously followed at [facility] and I have asked her    Secondary hypercoagulable state ([place]) 02/06/2020   Type 2 diabetes, controlled, with renal manifestation ([place]) 11/24/2013   Past Surgical History:  Procedure Laterality Date   A/V FISTULAGRAM N/A 03/28/2021   Procedure: A/V FISTULAGRAM;  Surgeon: Waynetta Sandy, MD;  Location: [place] [facility];  Service: Cardiovascular;  Laterality: N/A;   APPENDECTOMY     AV FISTULA PLACEMENT Left 08/05/2020   Procedure: LEFT UPPER EXTREMITY ARTERIOVENOUS (AV) FISTULA CREATION;  Surgeon: Cherre Robins, MD;  Location: [place];  Service: Vascular;  Laterality: Left;   [place] Left 09/16/2020   Procedure: LEFT SECOND STAGE [place];  Surgeon: Cherre Robins, MD;  Location: [place];  Service: Vascular;  Laterality: Left;  PERIPHERAL NERVE BLOCK   CARDIAC CATHETERIZATION     CARDIOVERSION N/A 12/14/2014   Procedure: CARDIOVERSION;  Surgeon: Dorothy Spark, MD;  Location: [place];  Service: Cardiovascular;  Laterality: N/A;   CARDIOVERSION N/A 02/26/2017   Procedure: CARDIOVERSION;  Surgeon: Lovena Le,  Champ Mungo, MD;  Location: [place] [facility];  Service: Cardiovascular;  Laterality: N/A;   CARDIOVERSION N/A 07/24/2017   Procedure: CARDIOVERSION;  Surgeon: Acie Fredrickson Wonda Cheng, MD;  Location: [facility] ENDOSCOPY;  Service: Cardiovascular;  Laterality: N/A;   CARDIOVERSION N/A 02/12/2020   Procedure: CARDIOVERSION;  Surgeon: [name], MD;  Location: [facility] ENDOSCOPY;  Service: Cardiovascular;  Laterality: N/A;   CARDIOVERSION N/A 09/15/2021   Procedure: CARDIOVERSION;  Surgeon: [name], MD;  Location: [facility] ENDOSCOPY;   Service: Cardiovascular;  Laterality: N/A;   CARPAL TUNNEL RELEASE  07/21/2021   COLONOSCOPY N/A 11/08/2017   Procedure: COLONOSCOPY;  Surgeon: Milus Banister, MD;  Location: [facility] ENDOSCOPY;  Service: Endoscopy;  Laterality: N/A;   COLONOSCOPY WITH PROPOFOL N/A 05/31/2020   Procedure: COLONOSCOPY WITH PROPOFOL;  Surgeon: Irene Shipper, MD;  Location: [facility] ENDOSCOPY;  Service: Endoscopy;  Laterality: N/A;   DILATION AND CURETTAGE OF UTERUS     ENTEROSCOPY N/A 03/02/2020   Procedure: ENTEROSCOPY;  Surgeon: Yetta Flock, MD;  Location: [facility] ENDOSCOPY;  Service: Gastroenterology;  Laterality: N/A;   EP IMPLANTABLE DEVICE N/A 10/06/2015   Procedure: BIV ICD Generator Changeout;  Surgeon: [name], MD;  Location: [place] [facility];  Service: Cardiovascular;  Laterality: N/A;   ESOPHAGOGASTRODUODENOSCOPY N/A 10/26/2017   Procedure: ESOPHAGOGASTRODUODENOSCOPY (EGD);  Surgeon: Ladene Artist, MD;  Location: [facility];  Service: Endoscopy;  Laterality: N/A;   ESOPHAGOGASTRODUODENOSCOPY (EGD) WITH PROPOFOL N/A 11/07/2017   Procedure: ESOPHAGOGASTRODUODENOSCOPY (EGD) WITH PROPOFOL;  Surgeon: Milus Banister, MD;  [facility]OS[facility];  Service: Endoscopy;  Laterality: N/A;   ESOPHAGOGASTRODUODENOSCOPY (EGD) WITH PROPOFOL N/A 05/29/2020   Procedure: ESOPHAGOGASTRODUODENOSCOPY (EGD) WITH PROPOFOL;  Surgeon: Doran Stabler, MD;  Location: [place];  Service: Gastroenterology;  Laterality: N/A;   GIVENS CAPSULE STUDY N/A 05/19/2020   Procedure: GIVENS CAPSULE STUDY;  Surgeon: [name], MD;  Location: [place];  Service: Endoscopy;  Laterality: N/A;  .adm for obs since pacemaker, PA wil enter order and see pt   GIVENS CAPSULE STUDY N/A 05/29/2020   Procedure: GIVENS CAPSULE STUDY;  Surgeon: Doran Stabler, MD;  Location: [place];  Service: Gastroenterology;  Laterality: N/A;   HEMATOMA EVACUATION Left 09/24/2020   Procedure: EVACUATION HEMATOMA ARM;  Surgeon:  Rosetta Posner, MD;  Location[facility] OR;  Service: Vascular;  Laterality: Left;   HOT HEMOSTASIS N/A 10/26/2017   Procedure: HOT HEMOSTASIS (ARGON PLASMA COAGULATION/BICAP);  Surgeon: Ladene Artist, MD;  Location: [facility];  Service: Endoscopy;  Laterality: N/A;   HOT HEMOSTASIS N/A 03/02/2020   Procedure: HOT HEMOSTASIS (ARGON PLASMA COAGULATION/BICAP);  Surgeon: Yetta Flock, MD;  Location: [facility] ENDOSCOPY;  Service: Gastroenterology;  Laterality: N/A;   HOT HEMOSTASIS N/A 05/31/2020   Procedure: HOT HEMOSTASIS (ARGON PLASMA COAGULATION/BICAP);  Surgeon: Irene Shipper, MD;  Location: [facility] ENDOSCOPY;  Service: Endoscopy;  Laterality: N/A;   INSERT / REPLACE / REMOVE PACEMAKER  ?2008   IR PERC TUN PERIT CATH WO PORT S&I /IMAG  07/05/2020   PERIPHERAL VASCULAR INTERVENTION Left 03/28/2021   Procedure: PERIPHERAL VASCULAR INTERVENTION;  Surgeon: Waynetta Sandy, MD;  Location: [place] [facility];  Service: Cardiovascular;  Laterality: Left;   POLYPECTOMY  11/08/2017   Procedure: POLYPECTOMY;  Surgeon: Milus Banister, MD;  [facility] ENDOS[facility];  Service: Endoscopy;;   PROCTOSCOPY N/A 05/22/2018   Procedure: RIGID PROCTOSCOPY;  Surgeon: Michael Boston, MD;  [facility];  Service: General;  Laterality: N/A;  RIGHT HEART CATH N/A 02/13/2018   Procedure: RIGHT HEART CATH;  Surgeon: [name], MD;  Location: [place] [facility];  Service: Cardiovascular;  Laterality: N/A;   RIGHT HEART CATH N/A 06/23/2020   Procedure: RIGHT HEART CATH;  Surgeon: [name], MD;  Location: [place] [facility];  Service: Cardiovascular;  Laterality: N/A;   RIGHT/LEFT HEART CATH AND CORONARY ANGIOGRAPHY N/A 12/11/2019   Procedure: RIGHT/LEFT HEART CATH AND CORONARY ANGIOGRAPHY;  Surgeon: [name], MD;  Location: [place] [facility];  Service: Cardiovascular;  Laterality: N/A;   SUBMUCOSAL TATTOO INJECTION  03/02/2020   Procedure: SUBMUCOSAL TATTOO INJECTION;  Surgeon: Yetta Flock, MD;  Location: [facility] ENDOSCOPY;  Service: Gastroenterology;;   SUBMUCOSAL TATTOO INJECTION  05/31/2020   Procedure: SUBMUCOSAL TATTOO INJECTION;  Surgeon: Irene Shipper, MD;  Location: [place];  Service: Endoscopy;;   TEE WITHOUT CARDIOVERSION N/A 08/02/2021   Procedure: TRANSESOPHAGEAL ECHOCARDIOGRAM (TEE);  Surgeon: [name], MD;  Location: [facility] ENDOSCOPY;  Service: Cardiovascular;  Laterality: N/A;   TUBAL LIGATION     XI ROBOTIC ASSISTED LOWER ANTERIOR RESECTION N/A 05/22/2018   Procedure: XI ROBOTIC ASSISTED SIGMOID COLOECTOMY MOBILIZATION OF SPLENIC FLEXURE, FIREFLY ASSESSMENT OF PERFUSION;  Surgeon: Michael Boston, MD;  [facility];  Service: General;  Laterality: N/A;  ERAS PATHWAY   Social History:  reports that she has never smoked. She has never used smokeless tobacco. She reports that she does not currently use alcohol. She reports that she does not currently use drugs after having used the following drugs: Marijuana.  No Known Allergies  Family History  Problem Relation Age of Onset   Asthma Father    Heart attack Father    Asthma Sister    Lung cancer Sister    Heart attack Mother    Stroke Brother    Colon cancer Neg Hx    Esophageal cancer Neg Hx    Pancreatic cancer Neg Hx    Stomach cancer Neg Hx    Liver disease Neg Hx     Prior to Admission medications   Medication Sig Start Date End Date Taking? Authorizing Provider  amiodarone (PACERONE) 200 MG tablet Take 1 tablet (200 mg total) by mouth daily. 07/27/20   Clegg, [name] D, NP  AURYXIA 1 GM 210 MG(Fe) tablet Take 420 mg by mouth 3 (three) times daily with meals. 08/11/21   [provider]  diazepam (VALIUM) 5 MG tablet 1 tab po bid prn anxiety 10/20/21   Saguier, Percell Miller, PA-C  ELIQUIS 5 MG TABS tablet Take 1 tablet by mouth twice daily 10/10/21   [name], MD  gabapentin (NEURONTIN) 300 MG capsule Take 1 capsule (300 mg total) by mouth daily. 07/12/20   Lyda Jester M, PA-C   isosorbide-hydrALAZINE (BIDIL) 20-37.5 MG tablet Take 0.5 tablets by mouth 3 (three) times daily. 10/21/20   [name], MD  levothyroxine (SYNTHROID) 137 MCG tablet Take 1 tablet (137 mcg total) by mouth daily before breakfast. 03/11/20   Billie Ruddy, MD  lidocaine-prilocaine (EMLA) cream 1 application. 3 (three) times a week. 09/08/21   [provider]  traZODone (DESYREL) 50 MG tablet Take 0.5-1 tablets (25-50 mg total) by mouth at bedtime as needed for sleep. 12/16/20   Saguier, Percell Miller, PA-C  XIIDRA 5 % SOLN Place 1 drop into both eyes 2 (two) times daily. 08/29/21   [provider]    Physical Exam: Vitals:   10/25/21 0630 10/25/21 0700 10/25/21  1009 10/25/21 1130  BP: (!) 106/57 114/67 110/62 123/70  Pulse: 94 94 88 90  Resp: '18 18 18 16  '$ Temp:    98.2 F (36.8 C)  TempSrc:      SpO2: (!) 87% 100% 99% 95%  Weight:      Height:       Exam  Constitutional: Elderly female currently in no acute distress Eyes: PERRL, lids and conjunctivae normal ENMT: Mucous membranes are moist.   Neck: normal, supple, no masses, no JVD Respiratory: clear to auscultation bilaterally, no wheezing, no crackles. Normal respiratory effort. No accessory muscle use.  Cardiovascular: Regular rate and rhythm, no murmurs / rubs / gallops. No extremity edema.  Left upper extremity fistula in place Abdomen: Mild lower abdominal tenderness to palpation without guarding.  Musculoskeletal: no clubbing / cyanosis. No joint deformity upper and lower extremities. Good ROM, no contractures. Normal muscle tone.  Skin: no rashes, lesions, ulcers. No induration Neurologic: CN 2-12 grossly intact. Strength 5/5 in all 4.  Psychiatric: Normal judgment and insight. Alert and oriented x 3. Normal mood.   Data Reviewed:  EKG reveals paced rhythm at 93 bpm  Assessment and Plan: Acute blood loss anemia secondary to GI bleed  Patient presents with complaints of rectal bleeding starting yesterday  afternoon.   Hemoglobin 9.9->9.  On physical exam patient with some mild lower abdominal tenderness. She had last taken Eliquis yesterday morning.  Prior history of esophageal varices seen during endoscopic in 02/2020, and AVMs noted on other studies.  GI planning on taking patient for EGD and colonoscopy in a.m. -Admit to a progressive bed -Type and screen for possible need of blood products -.N.p.o. after midnight for need of procedure -Hold Eliquis -Serial monitoring of H&H -Rocephin IV given history of varices -[place] [facility] consulted recommendation  Atrial flutter/atrial fibrillation on chronic anticoagulation Patient status post cardioversion with Dr. [name] 5/11.  Patient currently in a ventricularly paced rhythm.  -Hold Elqiuis -Continue amiodarone  Transient hypotension Blood pressures were noted to drop as low as 106/43 in the ED.  Currently blood pressures maintained with maps greater than 65.   -Hold BiDil and reassess when medically appropriate to resume  History of nonischemic cardiomyopathy s/p AICD At this time patient does not appear to be fluid overload.  Last echocardiogram revealed EF of 60 to 65% in 07/2021  COPD, without acute exacerbation Patient without significant wheezing on physical exam. -Breathing treatments as needed  Thrombocytopenia Acute on chronic.  Platelet count 96->94. -Continue to monitor  ESRD on HD Labs noted potassium within normal limits and creatinine 4.28.  Patient had dialyzed yesterday although reportedly completed a few minutes early due to weakness. -Continue current medication regimen -Nephrology consulted for likely need of hemodialysis following procedure  Compensated cirrhosis related to hepatitis C Patient is status post treatment with Harvoni.  Anxiety -Continue Valium as needed  Hypothyroidism TSH 4.89 on 6/1 -Continue levothyroxine  History of GI bleed Previously thought secondary to AVM.  DVT prophylaxis:  SCDs Advance Care Planning:   Code Status: Full Code   Consults: [facility]  Family Communication: No family requested to be updated at this time  Severity of Illness: The appropriate patient status for this patient is OBSERVATION. Observation status is judged to be reasonable and necessary in order to provide the required intensity of service to ensure the patient's safety. The patient's presenting symptoms, physical exam findings, and initial radiographic and laboratory data in the context of their medical condition is felt  to place them at decreased risk for further clinical deterioration. Furthermore, it is anticipated that the patient will be medically stable for discharge from the hospital within 2 midnights of admission.   Author: Norval Morton, MD 10/25/2021 12:58 PM  For on call review [email or web address].

## 2021-10-25 NOTE — Progress Notes (Signed)
Transferring facility: [facility] Requesting provider: Cherlynn June (EDP at [facility]) Reason for transfer: admission for further evaluation and management of suspected acute lower gastrointestinal bleed complicated by acute on chronic anemia.   76 year old female with medical history notable for chronic atrial fibrillation on chronic anticoagulation via Eliquis, end-stage renal disease on hemodialysis, who presented to [facility] ED on 10/24/2021 complaining of 2-3 episodes of bright blood per rectum, starting on the morning of 10/24/2021.  Has been associated with new onset left lower quadrant abdominal discomfort, without reported acute peritoneal signs on exam.  Otherwise, no additional associated symptoms, including no associated chest pain or shortness of breath.  She is on chronic anticoagulation via Eliquis in the setting of atrial fibrillation.  No reported history of alcohol abuse.  Most recent prior hemoglobin level was 9.8 on 10/06/2021.  Vital signs in the ED were reportedly stable, including normotensive and nontachycardic.  Labs were notable for initial hemoglobin level of 9.9, consistent with most recent prior hemoglobin from nearly 3 weeks ago, as above.  However, repeat hemoglobin level a few hours later showed interval decline to 8.9, without any interval administration of any medications or IV fluids.   EDP subsequently contacted on-call [place] gastro[facility]ology, Dr. Pincus Sanes, Who will consult.  As this is a hemodialysis patient, will admit to [facility], as opposed to [facility].  Subsequently, I accepted this patient for transfer for observation admission to a pcu bed at [facility].  Of note, [facility] does not currently have the capability of type/screen or PRBC transfusion.      Check [email or web address] for on-call coverage.   Nursing staff, Please call [facility] number on Amion as soon as patient's arrival, so appropriate admitting provider can  evaluate the pt.     Babs Bertin, DO Hospitalist

## 2021-10-25 NOTE — ED Notes (Signed)
Ambulated to bathroom with steady gait

## 2021-10-25 NOTE — ED Provider Notes (Signed)
Patient holding pending admission to [facility] for lower GI bleed.  Patient with episode of hypotension overnight.  Patient was assessed and she was laying on her left side with blood pressure cuff on the right upper extremity.  Pressures in the 90s over 70s, patient well-perfused, asymptomatic.  She was repositioned and repeat blood pressure 110s.  We will check repeat CBC.  Repeat CBC is stable.  Feel she is currently stable to await transfer to [facility].   Quintella Reichert, MD 10/25/21 [phone]

## 2021-10-25 NOTE — Telephone Encounter (Signed)
I will let the GI hospital team know and they will see her.

## 2021-10-25 NOTE — Telephone Encounter (Signed)
Pt stated that she was having a Lower GI bleed and went to [facility] ED yesterday afternoon and was transferred to [facility] to be admitted today. Pt stated that she wanted this information relayed to [name] to make him aware of her condition:

## 2021-10-25 NOTE — Consult Note (Addendum)
[facility] Consult: 1:34 PM 10/25/2021  LOS: 0 days    Referring Provider: Harvest Forest MD  Primary Care Physician:  Mackie Pai, PA-C Primary Gastroenterologist:  [name]     Reason for Consultation:  painless hematochezia.     HPI: Morgan Clay is a 76 y.o. female.  PMH cirrhosis of the liver.  Hep C eradicated with Harvoni.  Thrombocytopenia  (platelets 126 in July 2022) chronic Eliquis for A-fib.  Cardioversion 02/2020.  CHF, nonischemic cardiomyopathy noted in heart failure clinic.  S/p pacemaker, AICD.  ESRD MWF hemodialysis..  Sigmoid colectomy 2020 to address diverticulitis.  OSA, not on CPAP.  Anxiety.  Restless leg syndrome.  Hypothyroidism.  Ulnar neuropathy, s/p 07/21/2021 left carpal tunnel release and ulnar nerve transposition in [place].  Underwent successful outpatient DC cardioversion of a flutter on 09/15/2021.  Prior unsuccessful or short lived duration of NSR on prior DCCVs. Remains on Eliquis with last dose yesterday morning 6/19.   Hx GI bleeding and associated anemia with multiple work-ups in the past.  Treated with Feraheme, 2 PRBCs in 02/2020, 2 PRBCs in 04/2020.  Does not take or require PPI or antacids 10/26/2017 EGD:  For melena.  Benign, nonobstructing esophageal stenosis. Solitary, nonbleeding AVM in stomach treated with APC.  Normal duodenum to D2 11/07/2017 EGD: For melena a week and a half following EGD w APC of gastric AVM.  Small ulcer at site of recently APCd AVM, likley source for bleeding 11/08/2017 colonoscopy: Pandiverticulosis.  4 mm polyp removed from transverse colon.  Suspect anemia related to the gastric AVM treated 2 weeks prior. 12/2017 capsule endoscopy: Complete study with fair prep.  1 medium to large, nonbleeding AVM at 2 hours 56 minutes is 1 hour and 30  minutes beyond first duodenal image.  Otherwise negative study.  This AVM not reachable by enteroscopy and if further active bleeding would need referral for deep enteroscopy 03/02/2020  small bowel enteroscopy showed trace column esophageal varix w/o stigmata or bleeding.  Superficial AVMs and punctate lesions in the prepylorus were obliterated with APC.  Some scope trauma in the stomach noted.  Deviously placed tattoo at the extent of prior enteroscopy noted.  Dr. Arelia Longest suspected bleeding and anemia from the gastric findings but more distal, nonvisualized, distal small bowel AVMs could also be a source. Recently tested positive for COVID-19 as of 12/27.  This resulted in postponement of planned capsule endoscopy. 05/19/20 VCE.  Non-diagnostic as capsule never left the stomach. 05/29/2020 EGD.  For melena.  Study normal.  Video capsule enteroscope placed. 1/22/022 # 2 VCE.  Generally good prep though some areas obscured by retained food debris and fluid.  Small lymphangiectasia at distal duodenum/proximal jejunum.  Nonbleeding AVM at 1 hour from first duodenal image.  Active bleeding without clear source at 2 hours 50 minutes from first duodenal image.  Capsule retained at this area for about 15 minutes but eventually passed into the cecum but visualization limited by presence of blood.  18 minutes after first image of bleeding there was good imaging of the  IC valve.  Suspect active bleeding in distal ileum and/or terminal ileum 05/31/2020 colonoscopy.  Solitary, tiny, nonbleeding AVM at ileum treated with APC.  Not clear if this was culprit for bleed but it was the only lesion noted and thus treated.  Examined portion of ileum to 60 cm was normal, tattoo placed at proximal extent of exam.  Sigmoid diverticulosis and changes consistent with prior rectosigmoid resection.  Due for outpt surveillance EGD on 7/18 at [facility].       +++++++++++++++++++++++++++++++++++++++++++++++++++++++++++++++++++++++++  During  hemodialysis session yesterday she felt shaky, tremulous but was not cold or in any pain.  She asked to get off dialysis about 15 minutes early.  She went home.  In the afternoon she had a bowel movement and thought it look like she had eaten something red.  About 10 minutes later she had overt hematochezia with clots of bright red blood.  In all she had about 3 or 4 episodes prior to arrival in the emergency room around 8 PM last night.  No abdominal pain.  No NSAIDs or ASA products no nausea or vomiting, no dizziness.  She did feel weak.  She went to [facility] ED for evaluation.  Initial blood pressure 134/93, heart rate 87.  BPs as low as 106/57 without tachycardia early this morning.  Currently 123/70.  Room air sats generally in the high 90 to 100% range Hgb 9, was 9.9 yesterday.  As high as 13.3 5 weeks ago, generally runs under 11.  MCV 93.  Platelets 94 K.(165 three weeks ago). No INR, it was 1.1 5-weeks ago.   BUN/creatinine and GFR deranged consistent with ESRD.  Sodium normal 137.  LFTs normal. CTAP w contrast: Cirrhotic and fatty liver.  No changes consistent with portal hypertension.  No evidence of HCCA though liver protocol MRI and CT have better sensitivity for detecting HCCA.  Likely trace fluid containing left inguinal hernia.  Uterine fibroids.  Prominent IVC w retrograde reflux of contrast from right atrium raising question right heart strain.  Aortic atherosclerosis.  Lives in [place] with her son.  Previously was a moderately heavy drinker but has not drunk anything in years.  Does not smoke. Family history pertinent for lung cancer in sister.  No family history of peptic ulcer disease, liver disease, colorectal cancer, colitis, diverticular disease.  Past Medical History:  Diagnosis Date   Acute blood loss anemia 09/24/2020   AICD (automatic cardioverter/defibrillator) present 10/06/2015   Anemia due to chronic blood loss 04/15/2020   Anemia in chronic kidney disease  01/18/2018   Angiodysplasia of small intestine 06/10/2020   Ileum - seen on capsule endoscopy 05/2020 - ablated at colonoscopy   Aortic atherosclerosis ([place]) 08/30/2017   Arthritis    "qwhere" (01/03/2018)   Asthma    reports mild asthma since childhood - had COPD on dx list from prior PCP   Atrial fibrillation with RVR ([place]) 11/17/2020   Atrial fibrillation, chronic 10/25/2017   Atypical atrial flutter ([place]) 09/28/2015   AVM (arteriovenous malformation) of small bowel, acquired 01/14/2018   Biventricular ICD (implantable cardioverter-defibrillator) in place 01/08/2007   Qualifier: Diagnosis of  By: Hassell Done FNP, Nykedtra     Bleeding pseudoaneurysm of left brachiocephalic arteriovenous fistula ([place]) 09/24/2020   Bleeding pseudoaneurysm of left brachiocephalic AV fistula ([place]) 09/24/2020   Carpal tunnel syndrome, bilateral 06/09/2021   Chronic bronchitis ([place])    "get it most years; not this past year though" (01/03/2018)   Chronic diastolic CHF (congestive heart  failure) ([place]) 04/24/2007   Annotation: secondary to nonischemic cardiomyopathy s/p CRT-D Followed by Dr. Caryl Comes in Cardiology Qualifier: Diagnosis of  By: Hassell Done FNP, Nykedtra     Chronic obstructive pulmonary disease ([place])    Chronic systolic congestive heart failure ([place])    CKD (chronic kidney disease) stage 4, GFR 15-29 ml/min  11/24/2013   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 (HCV) ([place])    HEPATITIS C - s/p treatment with Harvoni, saw hepatology, Dawn Drazek   COVID-19 virus infection 05/27/2020   Diabetes mellitus without complication ([place])    DIET CONTROLLED    Dialysis complication    Diverticulitis of left colon status post robotic low anterior to sigmoid resection 05/22/2018 07/31/2017   End stage renal disease ([place]) 03/22/2021   FIBROIDS, UTERUS 03/05/2008   Glaucoma    Gout    History of blood transfusion ~ 11/2017   Hypertension associated with diabetes ([place]) 06/07/2017   Hypothyroidism    LBBB  (left bundle branch block)    Lesion of ulnar nerve 06/09/2021   Long term current use of anticoagulant    Lower GI bleeding    "been dealing w/it since 07/2017" (01/03/2018)   Malnutrition of moderate degree 07/09/2020   MDD (major depressive disorder), recurrent, in partial remission ([place]) 06/07/2017   Nonischemic cardiomyopathy ([place])    Obesity 05/27/2009   Qualifier: Diagnosis of  By: Hassell Done FNP, Nykedtra     OBSTRUCTIVE SLEEP APNEA 11/14/2007   no CPAP   OSTEOPENIA 09/30/2008   Other cirrhosis of liver ([place]) 08/12/2020   Formatting of this note is different from the original. Patient with history of cirrhosis secondary to hepatitis C with no history of hepatic decompensation.  She has had some hyponatremia and hypoalbuminemia in the past however most recent labs show relatively well-preserved liver synthetic function.  She will have labs to update liver synthetic function.  Hepatoma screening - patient is currentl   Overweight (BMI 25.0-29.9) 03/02/2020   Pain in right knee 05/13/2018   Persistent atrial fibrillation ([place]) 02/06/2020   Pneumonia    "several times" (01/03/2018)   Polyneuropathy associated with underlying disease ([place]) 06/09/2021   Pulmonary hypertension ([place]) on echocardiogram 01/14/2018   Secondary esophageal varices without bleeding ([place]) 12/23/2020   Formatting of this note might be different from the original. Patient had trace esophageal varices during small bowel enteroscopy in October 2021, follow-up EGD in January 2022 with no varices.  She previously had GI bleeding thought to be from AVM versus GAVE.  She is no longer on carvedilol and will need surveillance endoscopy.  Patient was previously followed at [facility] and I have asked her    Secondary hypercoagulable state ([place]) 02/06/2020   Type 2 diabetes, controlled, with renal manifestation ([place]) 11/24/2013    Past Surgical History:  Procedure Laterality Date   A/V FISTULAGRAM N/A 03/28/2021   Procedure:  A/V FISTULAGRAM;  Surgeon: Waynetta Sandy, MD;  Location: [facility];  Service: Cardiovascular;  Laterality: N/A;   APPENDECTOMY     AV FISTULA PLACEMENT Left 08/05/2020   Procedure: LEFT UPPER EXTREMITY ARTERIOVENOUS (AV) FISTULA CREATION;  Surgeon: Cherre Robins, MD;  Location: [place];  Service: Vascular;  Laterality: Left;   [place] Left 09/16/2020   Procedure: LEFT SECOND STAGE [place];  Surgeon: Cherre Robins, MD;  Location: [facility];  Service: Vascular;  Laterality: Left;  PERIPHERAL NERVE BLOCK   CARDIAC CATHETERIZATION     CARDIOVERSION N/A 12/14/2014  Procedure: CARDIOVERSION;  Surgeon: Dorothy Spark, MD;  Location: [facility] ENDOSCOPY;  Service: Cardiovascular;  Laterality: N/A;   CARDIOVERSION N/A 02/26/2017   Procedure: CARDIOVERSION;  Surgeon: Evans Lance, MD;  Location: [place] [facility];  Service: Cardiovascular;  Laterality: N/A;   CARDIOVERSION N/A 07/24/2017   Procedure: CARDIOVERSION;  Surgeon: Acie Fredrickson Wonda Cheng, MD;  Location: [facility] ENDOSCOPY;  Service: Cardiovascular;  Laterality: N/A;   CARDIOVERSION N/A 02/12/2020   Procedure: CARDIOVERSION;  Surgeon: [name], MD;  Location: [facility] ENDOSCOPY;  Service: Cardiovascular;  Laterality: N/A;   CARDIOVERSION N/A 09/15/2021   Procedure: CARDIOVERSION;  Surgeon: [name], MD;  Location: [facility] ENDOSCOPY;  Service: Cardiovascular;  Laterality: N/A;   CARPAL TUNNEL RELEASE  07/21/2021   COLONOSCOPY N/A 11/08/2017   Procedure: COLONOSCOPY;  Surgeon: Milus Banister, MD;  Location: [facility] ENDOSCOPY;  Service: Endoscopy;  Laterality: N/A;   COLONOSCOPY WITH PROPOFOL N/A 05/31/2020   Procedure: COLONOSCOPY WITH PROPOFOL;  Surgeon: Irene Shipper, MD;  Location: [facility] ENDOSCOPY;  Service: Endoscopy;  Laterality: N/A;   DILATION AND CURETTAGE OF UTERUS     ENTEROSCOPY N/A 03/02/2020   Procedure: ENTEROSCOPY;  Surgeon: Yetta Flock, MD;  Location: [facility] ENDOSCOPY;   Service: Gastroenterology;  Laterality: N/A;   EP IMPLANTABLE DEVICE N/A 10/06/2015   Procedure: BIV ICD Generator Changeout;  Surgeon: [name], MD;  Location: [place] [facility];  Service: Cardiovascular;  Laterality: N/A;   ESOPHAGOGASTRODUODENOSCOPY N/A 10/26/2017   Procedure: ESOPHAGOGASTRODUODENOSCOPY (EGD);  Surgeon: Ladene Artist, MD;  Location[facility];  Service: Endoscopy;  Laterality: N/A;   ESOPHAGOGASTRODUODENOSCOPY (EGD) WITH PROPOFOL N/A 11/07/2017   Procedure: ESOPHAGOGASTRODUODENOSCOPY (EGD) WITH PROPOFOL;  Surgeon: Milus Banister, MD;  [facility];  Service: Endoscopy;  Laterality: N/A;   ESOPHAGOGASTRODUODENOSCOPY (EGD) WITH PROPOFOL N/A 05/29/2020   Procedure: ESOPHAGOGASTRODUODENOSCOPY (EGD) WITH PROPOFOL;  Surgeon: Doran Stabler, MD;  Location: [place];  Service: Gastroenterology;  Laterality: N/A;   GIVENS CAPSULE STUDY N/A 05/19/2020   Procedure: GIVENS CAPSULE STUDY;  Surgeon: [name], MD;  Location: [place];  Service: Endoscopy;  Laterality: N/A;  .adm for obs since pacemaker, PA wil enter order and see pt   GIVENS CAPSULE STUDY N/A 05/29/2020   Procedure: GIVENS CAPSULE STUDY;  Surgeon: Doran Stabler, MD;  Location: [place];  Service: Gastroenterology;  Laterality: N/A;   HEMATOMA EVACUATION Left 09/24/2020   Procedure: EVACUATION HEMATOMA ARM;  Surgeon: Rosetta Posner, MD;  Location: [facility] OR;  Service: Vascular;  Laterality: Left;   HOT HEMOSTASIS N/A 10/26/2017   Procedure: HOT HEMOSTASIS (ARGON PLASMA COAGULATION/BICAP);  Surgeon: Ladene Artist, MD;  Location: [facility];  Service: Endoscopy;  Laterality: N/A;   HOT HEMOSTASIS N/A 03/02/2020   Procedure: HOT HEMOSTASIS (ARGON PLASMA COAGULATION/BICAP);  Surgeon: Yetta Flock, MD;  Location: [facility] ENDOSCOPY;  Service: Gastroenterology;  Laterality: N/A;   HOT HEMOSTASIS N/A 05/31/2020   Procedure: HOT HEMOSTASIS (ARGON PLASMA COAGULATION/BICAP);   Surgeon: Irene Shipper, MD;  Location: [facility] ENDOSCOPY;  Service: Endoscopy;  Laterality: N/A;   INSERT / REPLACE / REMOVE PACEMAKER  ?2008   IR PERC TUN PERIT CATH WO PORT S&I /IMAG  07/05/2020   PERIPHERAL VASCULAR INTERVENTION Left 03/28/2021   Procedure: PERIPHERAL VASCULAR INTERVENTION;  Surgeon: Waynetta Sandy, MD;  Location: [place] [facility];  Service: Cardiovascular;  Laterality: Left;   POLYPECTOMY  11/08/2017   Procedure: POLYPECTOMY;  Surgeon: Milus Banister, MD;  Location[facility];  Service: Endoscopy;;   PROCTOSCOPY N/A 05/22/2018   Procedure: RIGID PROCTOSCOPY;  Surgeon: Michael Boston, MD;  [facility];  Service: General;  Laterality: N/A;   RIGHT HEART CATH N/A 02/13/2018   Procedure: RIGHT HEART CATH;  Surgeon: [name], MD;  Location: [place] [facility];  Service: Cardiovascular;  Laterality: N/A;   RIGHT HEART CATH N/A 06/23/2020   Procedure: RIGHT HEART CATH;  Surgeon: [name], MD;  Location: [place] [facility];  Service: Cardiovascular;  Laterality: N/A;   RIGHT/LEFT HEART CATH AND CORONARY ANGIOGRAPHY N/A 12/11/2019   Procedure: RIGHT/LEFT HEART CATH AND CORONARY ANGIOGRAPHY;  Surgeon: [name], MD;  Location: [place] [facility];  Service: Cardiovascular;  Laterality: N/A;   SUBMUCOSAL TATTOO INJECTION  03/02/2020   Procedure: SUBMUCOSAL TATTOO INJECTION;  Surgeon: Yetta Flock, MD;  Location: [facility] ENDOSCOPY;  Service: Gastroenterology;;   SUBMUCOSAL TATTOO INJECTION  05/31/2020   Procedure: SUBMUCOSAL TATTOO INJECTION;  Surgeon: Irene Shipper, MD;  Location: [place];  Service: Endoscopy;;   TEE WITHOUT CARDIOVERSION N/A 08/02/2021   Procedure: TRANSESOPHAGEAL ECHOCARDIOGRAM (TEE);  Surgeon: [name], MD;  Location: [facility] ENDOSCOPY;  Service: Cardiovascular;  Laterality: N/A;   TUBAL LIGATION     XI ROBOTIC ASSISTED LOWER ANTERIOR RESECTION N/A 05/22/2018   Procedure: XI ROBOTIC ASSISTED SIGMOID COLOECTOMY  MOBILIZATION OF SPLENIC FLEXURE, FIREFLY ASSESSMENT OF PERFUSION;  Surgeon: Michael Boston, MD;  Location: [facility];  Service: General;  Laterality: N/A;  ERAS PATHWAY    Prior to Admission medications   Medication Sig Start Date End Date Taking? Authorizing Provider  amiodarone (PACERONE) 200 MG tablet Take 1 tablet (200 mg total) by mouth daily. 07/27/20  Yes Clegg, [name] D, NP  diazepam (VALIUM) 5 MG tablet 1 tab po bid prn anxiety Patient taking differently: Take 5 mg by mouth 2 (two) times daily as needed for anxiety. 10/20/21  Yes Saguier, Percell Miller, PA-C  ELIQUIS 5 MG TABS tablet Take 1 tablet by mouth twice daily Patient taking differently: Take 5 mg by mouth 2 (two) times daily. 10/10/21  Yes [name], MD  gabapentin (NEURONTIN) 300 MG capsule Take 1 capsule (300 mg total) by mouth daily. 07/12/20  Yes Simmons, Brittainy M, PA-C  isosorbide-hydrALAZINE (BIDIL) 20-37.5 MG tablet Take 0.5 tablets by mouth 3 (three) times daily. 10/21/20  Yes [name], MD  levothyroxine (SYNTHROID) 137 MCG tablet Take 1 tablet (137 mcg total) by mouth daily before breakfast. 03/11/20  Yes Billie Ruddy, MD  lidocaine-prilocaine (EMLA) cream 1 application. 3 (three) times a week. 09/08/21  Yes [provider]  traZODone (DESYREL) 50 MG tablet Take 0.5-1 tablets (25-50 mg total) by mouth at bedtime as needed for sleep. Patient taking differently: Take 50 mg by mouth at bedtime as needed for sleep. 12/16/20  Yes Saguier, Percell Miller, PA-C  AURYXIA 1 GM 210 MG(Fe) tablet Take 420 mg by mouth 3 (three) times daily with meals. Patient not taking: Reported on 10/25/2021 08/11/21   [provider]    Scheduled Meds:  sodium chloride flush  3 mL Intravenous Q12H   Infusions:  PRN Meds: acetaminophen **OR** acetaminophen, albuterol, ondansetron **OR** ondansetron (ZOFRAN) IV   Allergies as of 10/24/2021   (No Known Allergies)    Family History  Problem Relation Age of Onset   Asthma Father     Heart attack Father    Asthma Sister    Lung cancer Sister    Heart attack Mother  Stroke Brother    Colon cancer Neg Hx    Esophageal cancer Neg Hx    Pancreatic cancer Neg Hx    Stomach cancer Neg Hx    Liver disease Neg Hx     Social History   Socioeconomic History   Marital status: Divorced    Spouse name: Not on file   Number of children: 1   Years of education: 12   Highest education level: Not on file  Occupational History   Not on file  Tobacco Use   Smoking status: Never   Smokeless tobacco: Never  Vaping Use   Vaping Use: Never used  Substance and Sexual Activity   Alcohol use: Not Currently    Comment: 01/03/2018 "couple drinks/month"   Drug use: Not Currently    Types: Marijuana    Comment: VERY INTERMITTENT    Sexual activity: Not Currently  Other Topics Concern   Not on file  Social History Narrative   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      Home Situation: lives alone      Spiritual Beliefs: Christian - Baptist      Lifestyle: no regular exercise; poor diet      She is divorced at least one adult child   Never smoker    rare alcohol to occasional at best    no substance abuse         Social Determinants of Health   Financial Resource Strain: Low Risk  (07/15/2021)   Overall Financial Resource Strain (CARDIA)    Difficulty of Paying Living Expenses: Not hard at all  Food Insecurity: No Food Insecurity (07/15/2021)   Hunger Vital Sign    Worried About Running Out of Food in the Last Year: Never true    Ran Out of Food in the Last Year: Never true  Transportation Needs: No Transportation Needs (07/15/2021)   PRAPARE - Hydrologist (Medical): No    Lack of Transportation (Non-Medical): No  Physical Activity: Not on file  Stress: No Stress Concern Present (07/15/2021)   [place]    Feeling of Stress : Not at all  Social Connections:  Moderately Isolated (07/15/2021)   Social Connection and Isolation Panel [NHANES]    Frequency of Communication with Friends and Family: More than three times a week    Frequency of Social Gatherings with Friends and Family: Three times a week    Attends Religious Services: More than 4 times per year    Active Member of Clubs or Organizations: No    Attends Archivist Meetings: Never    Marital Status: Divorced  Human resources officer Violence: Not At Risk (07/15/2021)   Humiliation, Afraid, Rape, and Kick questionnaire    Fear of Current or Ex-Partner: No    Emotionally Abused: No    Physically Abused: No    Sexually Abused: No    REVIEW OF SYSTEMS: Constitutional: No profound fatigue or weakness. ENT:  No nose bleeds Pulm: No shortness of breath.  No cough. CV:  No palpitations, no LE edema.  No angina. GU:  No hematuria, no frequency GI: See HPI.  Stable moderate appetite.  No dysphagia. Heme: Other than the rectal bleeding, no unusual or excessive bleeding or bruising Transfusions: See HPI. Neuro:  No headaches, no peripheral tingling or numbness.  No dizziness, no syncope, no seizures. Derm:  No itching, no rash or sores.  Endocrine:  No sweats  or chills.  No polyuria or dysuria Immunization: Reviewed extensive history of multiple vaccines. Travel: Not queried.   PHYSICAL EXAM: Vital signs in last 24 hours: Vitals:   10/25/21 1009 10/25/21 1130  BP: 110/62 123/70  Pulse: 88 90  Resp: 18 16  Temp:  98.2 F (36.8 C)  SpO2: 99% 95%   Wt Readings from Last 3 Encounters:  10/24/21 57.2 kg  10/20/21 56.5 kg  10/06/21 57.2 kg    General: Patient is pleasant, overweight, does not look acutely ill but somewhat chronically ill-appearing. Head: No facial asymmetry or swelling.  No signs of head trauma. Eyes: Conjunctiva pale.  No scleral icterus.  EOMI.  Muddy sclera. Ears: Not hard of hearing Nose: No congestion or discharge Mouth: Mucosa is moist, pink, clear.   Tongue midline.  Dentures in place. Neck: No JVD, no masses, no thyromegaly. Lungs: Clear bilaterally without labored breathing or cough. Heart: Somewhat distant heart sounds which are regular.  NSR in the low 90s on telemetry. Abdomen: Soft, moderately obese.  Not tender.  No HSM, masses, bruits, hernias.  Bowel sounds active..   Rectal: Did not repeat exam.  Stool sample yesterday was FOBT positive. Musc/Skeltl: No joint redness, swelling or gross deformity. Extremities: Dialysis graft/fistula on left upper extremity with thrill.  No extremity edema. Neurologic: Oriented x3.  Good historian.  Moves all 4 limbs without tremor, strength not tested. Skin: No suspicious lesions rashes or sores encountered. Nodes: No cervical adenopathy Psych: Calm, pleasant, cooperative.  Fluid speech.  Intake/Output from previous day: No intake/output data recorded. Intake/Output this shift: No intake/output data recorded.  LAB RESULTS: Recent Labs    10/24/21 2040 10/24/21 2331 10/25/21 0410  WBC 5.4  --  5.1  HGB 9.9* 8.9* 9.0*  HCT 29.6* 27.3* 27.5*  PLT 96*  --  94*   BMET Lab Results  Component Value Date   NA 137 10/25/2021   NA 138 10/24/2021   NA 136 09/15/2021   K 3.9 10/25/2021   K 3.6 10/24/2021   K 4.6 09/15/2021   CL 95 (L) 10/25/2021   CL 94 (L) 10/24/2021   CL 97 (L) 09/15/2021   CO2 33 (H) 10/25/2021   CO2 33 (H) 10/24/2021   CO2 32 01/18/2021   GLUCOSE 75 10/25/2021   GLUCOSE 106 (H) 10/24/2021   GLUCOSE 83 09/15/2021   BUN 11 10/25/2021   BUN 10 10/24/2021   BUN 8 09/15/2021   CREATININE 4.28 (H) 10/25/2021   CREATININE 3.74 (H) 10/24/2021   CREATININE 5.20 (H) 09/15/2021   CALCIUM 7.8 (L) 10/25/2021   CALCIUM 8.2 (L) 10/24/2021   CALCIUM 9.8 01/18/2021   LFT Recent Labs    10/24/21 2040 10/25/21 0410  PROT 8.0  --   ALBUMIN 4.1 3.6  AST 22  --   ALT 12  --   ALKPHOS 73  --   BILITOT 0.6  --    PT/INR Lab Results  Component Value Date   INR 1.1  (H) 09/14/2020   INR 2.1 (H) 05/27/2020   INR 1.3 (H) 12/05/2019   Hepatitis Panel No results for input(s): "HEPBSAG", "HCVAB", "HEPAIGM", "HEPBIGM" in the last 72 hours. C-Diff No components found for: "CDIFF" Lipase     Component Value Date/Time   LIPASE 39 02/27/2018 1730    Drugs of Abuse     Component Value Date/Time   LABOPIA NONE DETECTED 10/02/2015 0329   COCAINSCRNUR NONE DETECTED 10/02/2015 0329   LABBENZ NONE DETECTED 10/02/2015 0329  AMPHETMU NONE DETECTED 10/02/2015 0329   THCU POSITIVE (A) 10/02/2015 0329   LABBARB NONE DETECTED 10/02/2015 0329     RADIOLOGY STUDIES: CT ABDOMEN PELVIS W CONTRAST  Result Date: 10/24/2021 CLINICAL DATA:  LLQ abdominal pain--patient has been on dialysis for a year and a half- EXAM: CT ABDOMEN AND PELVIS WITH CONTRAST TECHNIQUE: Multidetector CT imaging of the abdomen and pelvis was performed using the standard protocol following bolus administration of intravenous contrast. RADIATION DOSE REDUCTION: This exam was performed according to the departmental dose-optimization program which includes automated exposure control, adjustment of the mA and/or kV according to patient size and/or use of iterative reconstruction technique. CONTRAST:  138m OMNIPAQUE IOHEXOL 300 MG/ML  SOLN COMPARISON:  None Available. FINDINGS: Lower chest: Cardiac device leads partially visualized. No acute abnormality. Hepatobiliary: Nodular hepatic contour. The hepatic parenchyma is diffusely hypodense compared to the splenic parenchyma consistent with fatty infiltration. No focal liver abnormality. No gallstones, gallbladder wall thickening, or pericholecystic fluid. No biliary dilatation. Pancreas: No focal lesion. Normal pancreatic contour. No surrounding inflammatory changes. No main pancreatic ductal dilatation. Spleen: Normal in size without focal abnormality. Adrenals/Urinary Tract: No adrenal nodule bilaterally. Bilateral kidneys enhance symmetrically. No  hydronephrosis. No hydroureter. The urinary bladder is unremarkable. Stomach/Bowel: Query partial colectomy. Stomach is within normal limits. No evidence of bowel wall thickening or dilatation. Status post appendectomy Vascular/Lymphatic: Prominent inferior vena cava with retrograde reflux of contrast from the right atrium. No abdominal aorta or iliac aneurysm. Mild to moderate atherosclerotic plaque of the aorta and its branches. No abdominal, pelvic, or inguinal lymphadenopathy. Reproductive: Several calcified lesions within the urine walls measuring to 5.2 cm. Bilateral adnexa are unremarkable. Other: No intraperitoneal free fluid. No intraperitoneal free gas. No organized fluid collection. Musculoskeletal: Likely trace free fluid containing left inguinal hernia (2:66, 5:64). No suspicious lytic or blastic osseous lesions. No acute displaced fracture. Multilevel degenerative changes of the spine. IMPRESSION: 1. Cirrhosis and hepatic steatosis with no definite findings of portal hypertension. No focal liver lesions identified. Please note that liver protocol enhanced MR and CT are the most sensitive tests for the screening detection of hepatocellular carcinoma in the high risk setting of cirrhosis. 2. Likely trace free fluid containing left inguinal hernia 3. Uterine fibroids. 4. Prominent inferior vena cava with retrograde reflux of contrast from the right atrium. Query right heart strain. 5.  Aortic Atherosclerosis (ICD10-I70.0). Electronically Signed   By: MIven FinnM.D.   On: 10/24/2021 22:08      IMPRESSION:   GI bleed with painless large-volume hematochezia.  ?bleeding from AVM vs diverticular source?  Unlikely this represents bleeding from new/interval gastric or esophageal varices.  Another, though less likely possibility, is ulcer disease.  She does not take acid controlling medications but has no symptoms suggesting ulcer or reflux disease.  Repeated documentation of extensive colonic  pandiverticulosis..[name] SBE 02/2020 showed a trace column of nonbleeding esophageal varix.  This and other studies since then have confirmed both upper and lower AVMs.  Repeat EGD of 05/2020 did not see varices or portal hypertensive gastropathy.  Blood loss anemia.  Has not received transfusions this admission but in past years has received PRBCs.  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  Noncritical thrombocytopenia at 94 K.  Chronic Eliquis for history CVA and A-fib/flutter.  Underwent successful DC cardioversion 5/11 and currently remains in NSR with stable rate.  ESRD.  Hemodialysis Monday Wednesday Friday.    PLAN:  Case d/w Dr Lorenso Courier: EGD and Colonoscopy tomorrow.  Pt agreeable.    Added Rocephin for SBP prophylaxis.  Given she remains in NSR, should we seek cardiology guidance for required duration of Eliquis going forward?   [name]  10/25/2021, 1:34 PM Phone [phone]

## 2021-10-25 NOTE — H&P (View-Only) (Signed)
[facility] Consult: 1:34 PM 10/25/2021  LOS: 0 days    Referring Provider: Harvest Forest MD  Primary Care Physician:  Mackie Pai, PA-C Primary Gastroenterologist:  [name]     Reason for Consultation:  painless hematochezia.     HPI: Morgan Clay is a 76 y.o. female.  PMH cirrhosis of the liver.  Hep C eradicated with Harvoni.  Thrombocytopenia  (platelets 126 in July 2022) chronic Eliquis for A-fib.  Cardioversion 02/2020.  CHF, nonischemic cardiomyopathy noted in heart failure clinic.  S/p pacemaker, AICD.  ESRD MWF hemodialysis..  Sigmoid colectomy 2020 to address diverticulitis.  OSA, not on CPAP.  Anxiety.  Restless leg syndrome.  Hypothyroidism.  Ulnar neuropathy, s/p 07/21/2021 left carpal tunnel release and ulnar nerve transposition in [place].  Underwent successful outpatient DC cardioversion of a flutter on 09/15/2021.  Prior unsuccessful or short lived duration of NSR on prior DCCVs. Remains on Eliquis with last dose yesterday morning 6/19.   Hx GI bleeding and associated anemia with multiple work-ups in the past.  Treated with Feraheme, 2 PRBCs in 02/2020, 2 PRBCs in 04/2020.  Does not take or require PPI or antacids 10/26/2017 EGD:  For melena.  Benign, nonobstructing esophageal stenosis. Solitary, nonbleeding AVM in stomach treated with APC.  Normal duodenum to D2 11/07/2017 EGD: For melena a week and a half following EGD w APC of gastric AVM.  Small ulcer at site of recently APCd AVM, likley source for bleeding 11/08/2017 colonoscopy: Pandiverticulosis.  4 mm polyp removed from transverse colon.  Suspect anemia related to the gastric AVM treated 2 weeks prior. 12/2017 capsule endoscopy: Complete study with fair prep.  1 medium to large, nonbleeding AVM at 2 hours 56 minutes is 1 hour and 30  minutes beyond first duodenal image.  Otherwise negative study.  This AVM not reachable by enteroscopy and if further active bleeding would need referral for deep enteroscopy 03/02/2020  small bowel enteroscopy showed trace column esophageal varix w/o stigmata or bleeding.  Superficial AVMs and punctate lesions in the prepylorus were obliterated with APC.  Some scope trauma in the stomach noted.  Deviously placed tattoo at the extent of prior enteroscopy noted.  Dr. Arelia Longest suspected bleeding and anemia from the gastric findings but more distal, nonvisualized, distal small bowel AVMs could also be a source. Recently tested positive for COVID-19 as of 12/27.  This resulted in postponement of planned capsule endoscopy. 05/19/20 VCE.  Non-diagnostic as capsule never left the stomach. 05/29/2020 EGD.  For melena.  Study normal.  Video capsule enteroscope placed. 1/22/022 # 2 VCE.  Generally good prep though some areas obscured by retained food debris and fluid.  Small lymphangiectasia at distal duodenum/proximal jejunum.  Nonbleeding AVM at 1 hour from first duodenal image.  Active bleeding without clear source at 2 hours 50 minutes from first duodenal image.  Capsule retained at this area for about 15 minutes but eventually passed into the cecum but visualization limited by presence of blood.  18 minutes after first image of bleeding there was good imaging of the  IC valve.  Suspect active bleeding in distal ileum and/or terminal ileum 05/31/2020 colonoscopy.  Solitary, tiny, nonbleeding AVM at ileum treated with APC.  Not clear if this was culprit for bleed but it was the only lesion noted and thus treated.  Examined portion of ileum to 60 cm was normal, tattoo placed at proximal extent of exam.  Sigmoid diverticulosis and changes consistent with prior rectosigmoid resection.  Due for outpt surveillance EGD on 7/18 at [facility].       +++++++++++++++++++++++++++++++++++++++++++++++++++++++++++++++++++++++++  During  hemodialysis session yesterday she felt shaky, tremulous but was not cold or in any pain.  She asked to get off dialysis about 15 minutes early.  She went home.  In the afternoon she had a bowel movement and thought it look like she had eaten something red.  About 10 minutes later she had overt hematochezia with clots of bright red blood.  In all she had about 3 or 4 episodes prior to arrival in the emergency room around 8 PM last night.  No abdominal pain.  No NSAIDs or ASA products no nausea or vomiting, no dizziness.  She did feel weak.  She went to the [facility] ED for evaluation.  Initial blood pressure 134/93, heart rate 87.  BPs as low as 106/57 without tachycardia early this morning.  Currently 123/70.  Room air sats generally in the high 90 to 100% range Hgb 9, was 9.9 yesterday.  As high as 13.3 5 weeks ago, generally runs under 11.  MCV 93.  Platelets 94 K.(165 three weeks ago). No INR, it was 1.1 5-weeks ago.   BUN/creatinine and GFR deranged consistent with ESRD.  Sodium normal 137.  LFTs normal. CTAP w contrast: Cirrhotic and fatty liver.  No changes consistent with portal hypertension.  No evidence of HCCA though liver protocol MRI and CT have better sensitivity for detecting HCCA.  Likely trace fluid containing left inguinal hernia.  Uterine fibroids.  Prominent IVC w retrograde reflux of contrast from right atrium raising question right heart strain.  Aortic atherosclerosis.  Lives in [place] with her son.  Previously was a moderately heavy drinker but has not drunk anything in years.  Does not smoke. Family history pertinent for lung cancer in sister.  No family history of peptic ulcer disease, liver disease, colorectal cancer, colitis, diverticular disease.  Past Medical History:  Diagnosis Date   Acute blood loss anemia 09/24/2020   AICD (automatic cardioverter/defibrillator) present 10/06/2015   Anemia due to chronic blood loss 04/15/2020   Anemia in chronic kidney disease  01/18/2018   Angiodysplasia of small intestine 06/10/2020   Ileum - seen on capsule endoscopy 05/2020 - ablated at colonoscopy   Aortic atherosclerosis ([place]) 08/30/2017   Arthritis    "qwhere" (01/03/2018)   Asthma    reports mild asthma since childhood - had COPD on dx list from prior PCP   Atrial fibrillation with RVR ([place]) 11/17/2020   Atrial fibrillation, chronic 10/25/2017   Atypical atrial flutter ([place]) 09/28/2015   AVM (arteriovenous malformation) of small bowel, acquired 01/14/2018   Biventricular ICD (implantable cardioverter-defibrillator) in place 01/08/2007   Qualifier: Diagnosis of  By: Hassell Done FNP, Nykedtra     Bleeding pseudoaneurysm of left brachiocephalic arteriovenous fistula ([place]) 09/24/2020   Bleeding pseudoaneurysm of left brachiocephalic AV fistula ([place]) 09/24/2020   Carpal tunnel syndrome, bilateral 06/09/2021   Chronic bronchitis ([place])    "get it most years; not this past year though" (01/03/2018)   Chronic diastolic CHF (congestive heart  failure) ([place]) 04/24/2007   Annotation: secondary to nonischemic cardiomyopathy s/p CRT-D Followed by Dr. Caryl Comes in Cardiology Qualifier: Diagnosis of  By: Hassell Done FNP, Nykedtra     Chronic obstructive pulmonary disease ([place])    Chronic systolic congestive heart failure ([place])    CKD (chronic kidney disease) stage 4, GFR 15-29 ml/min  11/24/2013   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 (HCV) ([place])    HEPATITIS C - s/p treatment with Harvoni, saw hepatology, Dawn Drazek   COVID-19 virus infection 05/27/2020   Diabetes mellitus without complication ([place])    DIET CONTROLLED    Dialysis complication    Diverticulitis of left colon status post robotic low anterior to sigmoid resection 05/22/2018 07/31/2017   End stage renal disease ([place]) 03/22/2021   FIBROIDS, UTERUS 03/05/2008   Glaucoma    Gout    History of blood transfusion ~ 11/2017   Hypertension associated with diabetes ([place]) 06/07/2017   Hypothyroidism    LBBB  (left bundle branch block)    Lesion of ulnar nerve 06/09/2021   Long term current use of anticoagulant    Lower GI bleeding    "been dealing w/it since 07/2017" (01/03/2018)   Malnutrition of moderate degree 07/09/2020   MDD (major depressive disorder), recurrent, in partial remission ([place]) 06/07/2017   Nonischemic cardiomyopathy ([place])    Obesity 05/27/2009   Qualifier: Diagnosis of  By: Hassell Done FNP, Nykedtra     OBSTRUCTIVE SLEEP APNEA 11/14/2007   no CPAP   OSTEOPENIA 09/30/2008   Other cirrhosis of liver ([place]) 08/12/2020   Formatting of this note is different from the original. Patient with history of cirrhosis secondary to hepatitis C with no history of hepatic decompensation.  She has had some hyponatremia and hypoalbuminemia in the past however most recent labs show relatively well-preserved liver synthetic function.  She will have labs to update liver synthetic function.  Hepatoma screening - patient is currentl   Overweight (BMI 25.0-29.9) 03/02/2020   Pain in right knee 05/13/2018   Persistent atrial fibrillation ([place]) 02/06/2020   Pneumonia    "several times" (01/03/2018)   Polyneuropathy associated with underlying disease ([place]) 06/09/2021   Pulmonary hypertension ([place]) on echocardiogram 01/14/2018   Secondary esophageal varices without bleeding ([place]) 12/23/2020   Formatting of this note might be different from the original. Patient had trace esophageal varices during small bowel enteroscopy in October 2021, follow-up EGD in January 2022 with no varices.  She previously had GI bleeding thought to be from AVM versus GAVE.  She is no longer on carvedilol and will need surveillance endoscopy.  Patient was previously followed at [facility] and I have asked her    Secondary hypercoagulable state ([place]) 02/06/2020   Type 2 diabetes, controlled, with renal manifestation ([place]) 11/24/2013    Past Surgical History:  Procedure Laterality Date   A/V FISTULAGRAM N/A 03/28/2021   Procedure:  A/V FISTULAGRAM;  Surgeon: Waynetta Sandy, MD;  Location: [place] CV LAB;  Service: Cardiovascular;  Laterality: N/A;   APPENDECTOMY     AV FISTULA PLACEMENT Left 08/05/2020   Procedure: LEFT UPPER EXTREMITY ARTERIOVENOUS (AV) FISTULA CREATION;  Surgeon: Cherre Robins, MD;  Location: [place];  Service: Vascular;  Laterality: Left;   [place] Left 09/16/2020   Procedure: LEFT SECOND STAGE [place];  Surgeon: Cherre Robins, MD;  Location: [place];  Service: Vascular;  Laterality: Left;  PERIPHERAL NERVE BLOCK   CARDIAC CATHETERIZATION     CARDIOVERSION N/A 12/14/2014  Procedure: CARDIOVERSION;  Surgeon: Dorothy Spark, MD;  Location[facility] ENDOSCOPY;  Service: Cardiovascular;  Laterality: N/A;   CARDIOVERSION N/A 02/26/2017   Procedure: CARDIOVERSION;  Surgeon: Evans Lance, MD;  Location: [place] CV LAB;  Service: Cardiovascular;  Laterality: N/A;   CARDIOVERSION N/A 07/24/2017   Procedure: CARDIOVERSION;  Surgeon: Acie Fredrickson Wonda Cheng, MD;  Location: [facility] ENDOSCOPY;  Service: Cardiovascular;  Laterality: N/A;   CARDIOVERSION N/A 02/12/2020   Procedure: CARDIOVERSION;  Surgeon: [name], MD;  Location: [facility] ENDOSCOPY;  Service: Cardiovascular;  Laterality: N/A;   CARDIOVERSION N/A 09/15/2021   Procedure: CARDIOVERSION;  Surgeon: [name], MD;  Location: [facility] ENDOSCOPY;  Service: Cardiovascular;  Laterality: N/A;   CARPAL TUNNEL RELEASE  07/21/2021   COLONOSCOPY N/A 11/08/2017   Procedure: COLONOSCOPY;  Surgeon: Milus Banister, MD;  Location: [facility] ENDOSCOPY;  Service: Endoscopy;  Laterality: N/A;   COLONOSCOPY WITH PROPOFOL N/A 05/31/2020   Procedure: COLONOSCOPY WITH PROPOFOL;  Surgeon: Irene Shipper, MD;  Location: [facility] ENDOSCOPY;  Service: Endoscopy;  Laterality: N/A;   DILATION AND CURETTAGE OF UTERUS     ENTEROSCOPY N/A 03/02/2020   Procedure: ENTEROSCOPY;  Surgeon: Yetta Flock, MD;  Location: [facility] ENDOSCOPY;   Service: Gastroenterology;  Laterality: N/A;   EP IMPLANTABLE DEVICE N/A 10/06/2015   Procedure: BIV ICD Generator Changeout;  Surgeon: [name], MD;  Location: [place] [facility] LAB;  Service: Cardiovascular;  Laterality: N/A;   ESOPHAGOGASTRODUODENOSCOPY N/A 10/26/2017   Procedure: ESOPHAGOGASTRODUODENOSCOPY (EGD);  Surgeon: Ladene Artist, MD;  Location: [facility];  Service: Endoscopy;  Laterality: N/A;   ESOPHAGOGASTRODUODENOSCOPY (EGD) WITH PROPOFOL N/A 11/07/2017   Procedure: ESOPHAGOGASTRODUODENOSCOPY (EGD) WITH PROPOFOL;  Surgeon: Milus Banister, MD;  Location: [facility] ENDOSCOPY;  Service: Endoscopy;  Laterality: N/A;   ESOPHAGOGASTRODUODENOSCOPY (EGD) WITH PROPOFOL N/A 05/29/2020   Procedure: ESOPHAGOGASTRODUODENOSCOPY (EGD) WITH PROPOFOL;  Surgeon: Doran Stabler, MD;  Location: [place];  Service: Gastroenterology;  Laterality: N/A;   GIVENS CAPSULE STUDY N/A 05/19/2020   Procedure: GIVENS CAPSULE STUDY;  Surgeon: [name], MD;  Location: [place];  Service: Endoscopy;  Laterality: N/A;  .adm for obs since pacemaker, PA wil enter order and see pt   GIVENS CAPSULE STUDY N/A 05/29/2020   Procedure: GIVENS CAPSULE STUDY;  Surgeon: Doran Stabler, MD;  Location: [place];  Service: Gastroenterology;  Laterality: N/A;   HEMATOMA EVACUATION Left 09/24/2020   Procedure: EVACUATION HEMATOMA ARM;  Surgeon: Rosetta Posner, MD;  Location: [facility] OR;  Service: Vascular;  Laterality: Left;   HOT HEMOSTASIS N/A 10/26/2017   Procedure: HOT HEMOSTASIS (ARGON PLASMA COAGULATION/BICAP);  Surgeon: Ladene Artist, MD;  Location: [facility];  Service: Endoscopy;  Laterality: N/A;   HOT HEMOSTASIS N/A 03/02/2020   Procedure: HOT HEMOSTASIS (ARGON PLASMA COAGULATION/BICAP);  Surgeon: Yetta Flock, MD;  Location: [facility] ENDOSCOPY;  Service: Gastroenterology;  Laterality: N/A;   HOT HEMOSTASIS N/A 05/31/2020   Procedure: HOT HEMOSTASIS (ARGON PLASMA COAGULATION/BICAP);   Surgeon: Irene Shipper, MD;  Location: [facility] ENDOSCOPY;  Service: Endoscopy;  Laterality: N/A;   INSERT / REPLACE / REMOVE PACEMAKER  ?2008   IR PERC TUN PERIT CATH WO PORT S&I /IMAG  07/05/2020   PERIPHERAL VASCULAR INTERVENTION Left 03/28/2021   Procedure: PERIPHERAL VASCULAR INTERVENTION;  Surgeon: Waynetta Sandy, MD;  Location: [place] CV LAB;  Service: Cardiovascular;  Laterality: Left;   POLYPECTOMY  11/08/2017   Procedure: POLYPECTOMY;  Surgeon: Milus Banister, MD;  Location: [facility]  END[facility];  Service: Endoscopy;;   PROCTOSCOPY N/A 05/22/2018   Procedure: RIGID PROCTOSCOPY;  Surgeon: Michael Boston, MD;  [facility]S;  Service: General;  Laterality: N/A;   RIGHT HEART CATH N/A 02/13/2018   Procedure: RIGHT HEART CATH;  Surgeon: [name], MD;  Location: [place] CV LAB;  Service: Cardiovascular;  Laterality: N/A;   RIGHT HEART CATH N/A 06/23/2020   Procedure: RIGHT HEART CATH;  Surgeon: [name], MD;  Location: [place] CV LAB;  Service: Cardiovascular;  Laterality: N/A;   RIGHT/LEFT HEART CATH AND CORONARY ANGIOGRAPHY N/A 12/11/2019   Procedure: RIGHT/LEFT HEART CATH AND CORONARY ANGIOGRAPHY;  Surgeon: [name], MD;  Location: [place] [facility] LAB;  Service: Cardiovascular;  Laterality: N/A;   SUBMUCOSAL TATTOO INJECTION  03/02/2020   Procedure: SUBMUCOSAL TATTOO INJECTION;  Surgeon: Yetta Flock, MD;  Location: [facility] ENDOSCOPY;  Service: Gastroenterology;;   SUBMUCOSAL TATTOO INJECTION  05/31/2020   Procedure: SUBMUCOSAL TATTOO INJECTION;  Surgeon: Irene Shipper, MD;  Location: [place];  Service: Endoscopy;;   TEE WITHOUT CARDIOVERSION N/A 08/02/2021   Procedure: TRANSESOPHAGEAL ECHOCARDIOGRAM (TEE);  Surgeon: [name], MD;  Location: [facility] ENDOSCOPY;  Service: Cardiovascular;  Laterality: N/A;   TUBAL LIGATION     XI ROBOTIC ASSISTED LOWER ANTERIOR RESECTION N/A 05/22/2018   Procedure: XI ROBOTIC ASSISTED SIGMOID COLOECTOMY  MOBILIZATION OF SPLENIC FLEXURE, FIREFLY ASSESSMENT OF PERFUSION;  Surgeon: Michael Boston, MD;  Location: [facility] ORS;  Service: General;  Laterality: N/A;  ERAS PATHWAY    Prior to Admission medications   Medication Sig Start Date End Date Taking? Authorizing Provider  amiodarone (PACERONE) 200 MG tablet Take 1 tablet (200 mg total) by mouth daily. 07/27/20  Yes Clegg, [name] D, NP  diazepam (VALIUM) 5 MG tablet 1 tab po bid prn anxiety Patient taking differently: Take 5 mg by mouth 2 (two) times daily as needed for anxiety. 10/20/21  Yes Saguier, Percell Miller, PA-C  ELIQUIS 5 MG TABS tablet Take 1 tablet by mouth twice daily Patient taking differently: Take 5 mg by mouth 2 (two) times daily. 10/10/21  Yes [name], MD  gabapentin (NEURONTIN) 300 MG capsule Take 1 capsule (300 mg total) by mouth daily. 07/12/20  Yes Simmons, Brittainy M, PA-C  isosorbide-hydrALAZINE (BIDIL) 20-37.5 MG tablet Take 0.5 tablets by mouth 3 (three) times daily. 10/21/20  Yes [name], MD  levothyroxine (SYNTHROID) 137 MCG tablet Take 1 tablet (137 mcg total) by mouth daily before breakfast. 03/11/20  Yes Billie Ruddy, MD  lidocaine-prilocaine (EMLA) cream 1 application. 3 (three) times a week. 09/08/21  Yes [provider]  traZODone (DESYREL) 50 MG tablet Take 0.5-1 tablets (25-50 mg total) by mouth at bedtime as needed for sleep. Patient taking differently: Take 50 mg by mouth at bedtime as needed for sleep. 12/16/20  Yes Saguier, Percell Miller, PA-C  AURYXIA 1 GM 210 MG(Fe) tablet Take 420 mg by mouth 3 (three) times daily with meals. Patient not taking: Reported on 10/25/2021 08/11/21   [provider]    Scheduled Meds:  sodium chloride flush  3 mL Intravenous Q12H   Infusions:  PRN Meds: acetaminophen **OR** acetaminophen, albuterol, ondansetron **OR** ondansetron (ZOFRAN) IV   Allergies as of 10/24/2021   (No Known Allergies)    Family History  Problem Relation Age of Onset   Asthma Father     Heart attack Father    Asthma Sister    Lung cancer Sister    Heart attack Mother  Stroke Brother    Colon cancer Neg Hx    Esophageal cancer Neg Hx    Pancreatic cancer Neg Hx    Stomach cancer Neg Hx    Liver disease Neg Hx     Social History   Socioeconomic History   Marital status: Divorced    Spouse name: Not on file   Number of children: 1   Years of education: 12   Highest education level: Not on file  Occupational History   Not on file  Tobacco Use   Smoking status: Never   Smokeless tobacco: Never  Vaping Use   Vaping Use: Never used  Substance and Sexual Activity   Alcohol use: Not Currently    Comment: 01/03/2018 "couple drinks/month"   Drug use: Not Currently    Types: Marijuana    Comment: VERY INTERMITTENT    Sexual activity: Not Currently  Other Topics Concern   Not on file  Social History Narrative   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      Home Situation: lives alone      Spiritual Beliefs: Christian - Baptist      Lifestyle: no regular exercise; poor diet      She is divorced at least one adult child   Never smoker    rare alcohol to occasional at best    no substance abuse         Social Determinants of Health   Financial Resource Strain: Low Risk  (07/15/2021)   Overall Financial Resource Strain (CARDIA)    Difficulty of Paying Living Expenses: Not hard at all  Food Insecurity: No Food Insecurity (07/15/2021)   Hunger Vital Sign    Worried About Running Out of Food in the Last Year: Never true    Ran Out of Food in the Last Year: Never true  Transportation Needs: No Transportation Needs (07/15/2021)   PRAPARE - Hydrologist (Medical): No    Lack of Transportation (Non-Medical): No  Physical Activity: Not on file  Stress: No Stress Concern Present (07/15/2021)   [place]    Feeling of Stress : Not at all  Social Connections:  Moderately Isolated (07/15/2021)   Social Connection and Isolation Panel [NHANES]    Frequency of Communication with Friends and Family: More than three times a week    Frequency of Social Gatherings with Friends and Family: Three times a week    Attends Religious Services: More than 4 times per year    Active Member of Clubs or Organizations: No    Attends Archivist Meetings: Never    Marital Status: Divorced  Human resources officer Violence: Not At Risk (07/15/2021)   Humiliation, Afraid, Rape, and Kick questionnaire    Fear of Current or Ex-Partner: No    Emotionally Abused: No    Physically Abused: No    Sexually Abused: No    REVIEW OF SYSTEMS: Constitutional: No profound fatigue or weakness. ENT:  No nose bleeds Pulm: No shortness of breath.  No cough. CV:  No palpitations, no LE edema.  No angina. GU:  No hematuria, no frequency GI: See HPI.  Stable moderate appetite.  No dysphagia. Heme: Other than the rectal bleeding, no unusual or excessive bleeding or bruising Transfusions: See HPI. Neuro:  No headaches, no peripheral tingling or numbness.  No dizziness, no syncope, no seizures. Derm:  No itching, no rash or sores.  Endocrine:  No sweats  or chills.  No polyuria or dysuria Immunization: Reviewed extensive history of multiple vaccines. Travel: Not queried.   PHYSICAL EXAM: Vital signs in last 24 hours: Vitals:   10/25/21 1009 10/25/21 1130  BP: 110/62 123/70  Pulse: 88 90  Resp: 18 16  Temp:  98.2 F (36.8 C)  SpO2: 99% 95%   Wt Readings from Last 3 Encounters:  10/24/21 57.2 kg  10/20/21 56.5 kg  10/06/21 57.2 kg    General: Patient is pleasant, overweight, does not look acutely ill but somewhat chronically ill-appearing. Head: No facial asymmetry or swelling.  No signs of head trauma. Eyes: Conjunctiva pale.  No scleral icterus.  EOMI.  Muddy sclera. Ears: Not hard of hearing Nose: No congestion or discharge Mouth: Mucosa is moist, pink, clear.   Tongue midline.  Dentures in place. Neck: No JVD, no masses, no thyromegaly. Lungs: Clear bilaterally without labored breathing or cough. Heart: Somewhat distant heart sounds which are regular.  NSR in the low 90s on telemetry. Abdomen: Soft, moderately obese.  Not tender.  No HSM, masses, bruits, hernias.  Bowel sounds active..   Rectal: Did not repeat exam.  Stool sample yesterday was FOBT positive. Musc/Skeltl: No joint redness, swelling or gross deformity. Extremities: Dialysis graft/fistula on left upper extremity with thrill.  No extremity edema. Neurologic: Oriented x3.  Good historian.  Moves all 4 limbs without tremor, strength not tested. Skin: No suspicious lesions rashes or sores encountered. Nodes: No cervical adenopathy Psych: Calm, pleasant, cooperative.  Fluid speech.  Intake/Output from previous day: No intake/output data recorded. Intake/Output this shift: No intake/output data recorded.  LAB RESULTS: Recent Labs    10/24/21 2040 10/24/21 2331 10/25/21 0410  WBC 5.4  --  5.1  HGB 9.9* 8.9* 9.0*  HCT 29.6* 27.3* 27.5*  PLT 96*  --  94*   BMET Lab Results  Component Value Date   NA 137 10/25/2021   NA 138 10/24/2021   NA 136 09/15/2021   K 3.9 10/25/2021   K 3.6 10/24/2021   K 4.6 09/15/2021   CL 95 (L) 10/25/2021   CL 94 (L) 10/24/2021   CL 97 (L) 09/15/2021   CO2 33 (H) 10/25/2021   CO2 33 (H) 10/24/2021   CO2 32 01/18/2021   GLUCOSE 75 10/25/2021   GLUCOSE 106 (H) 10/24/2021   GLUCOSE 83 09/15/2021   BUN 11 10/25/2021   BUN 10 10/24/2021   BUN 8 09/15/2021   CREATININE 4.28 (H) 10/25/2021   CREATININE 3.74 (H) 10/24/2021   CREATININE 5.20 (H) 09/15/2021   CALCIUM 7.8 (L) 10/25/2021   CALCIUM 8.2 (L) 10/24/2021   CALCIUM 9.8 01/18/2021   LFT Recent Labs    10/24/21 2040 10/25/21 0410  PROT 8.0  --   ALBUMIN 4.1 3.6  AST 22  --   ALT 12  --   ALKPHOS 73  --   BILITOT 0.6  --    PT/INR Lab Results  Component Value Date   INR 1.1  (H) 09/14/2020   INR 2.1 (H) 05/27/2020   INR 1.3 (H) 12/05/2019   Hepatitis Panel No results for input(s): "HEPBSAG", "HCVAB", "HEPAIGM", "HEPBIGM" in the last 72 hours. C-Diff No components found for: "CDIFF" Lipase     Component Value Date/Time   LIPASE 39 02/27/2018 1730    Drugs of Abuse     Component Value Date/Time   LABOPIA NONE DETECTED 10/02/2015 0329   COCAINSCRNUR NONE DETECTED 10/02/2015 0329   LABBENZ NONE DETECTED 10/02/2015 0329  AMPHETMU NONE DETECTED 10/02/2015 0329   THCU POSITIVE (A) 10/02/2015 0329   LABBARB NONE DETECTED 10/02/2015 0329     RADIOLOGY STUDIES: CT ABDOMEN PELVIS W CONTRAST  Result Date: 10/24/2021 CLINICAL DATA:  LLQ abdominal pain--patient has been on dialysis for a year and a half- EXAM: CT ABDOMEN AND PELVIS WITH CONTRAST TECHNIQUE: Multidetector CT imaging of the abdomen and pelvis was performed using the standard protocol following bolus administration of intravenous contrast. RADIATION DOSE REDUCTION: This exam was performed according to the departmental dose-optimization program which includes automated exposure control, adjustment of the mA and/or kV according to patient size and/or use of iterative reconstruction technique. CONTRAST:  143m OMNIPAQUE IOHEXOL 300 MG/ML  SOLN COMPARISON:  None Available. FINDINGS: Lower chest: Cardiac device leads partially visualized. No acute abnormality. Hepatobiliary: Nodular hepatic contour. The hepatic parenchyma is diffusely hypodense compared to the splenic parenchyma consistent with fatty infiltration. No focal liver abnormality. No gallstones, gallbladder wall thickening, or pericholecystic fluid. No biliary dilatation. Pancreas: No focal lesion. Normal pancreatic contour. No surrounding inflammatory changes. No main pancreatic ductal dilatation. Spleen: Normal in size without focal abnormality. Adrenals/Urinary Tract: No adrenal nodule bilaterally. Bilateral kidneys enhance symmetrically. No  hydronephrosis. No hydroureter. The urinary bladder is unremarkable. Stomach/Bowel: Query partial colectomy. Stomach is within normal limits. No evidence of bowel wall thickening or dilatation. Status post appendectomy Vascular/Lymphatic: Prominent inferior vena cava with retrograde reflux of contrast from the right atrium. No abdominal aorta or iliac aneurysm. Mild to moderate atherosclerotic plaque of the aorta and its branches. No abdominal, pelvic, or inguinal lymphadenopathy. Reproductive: Several calcified lesions within the urine walls measuring to 5.2 cm. Bilateral adnexa are unremarkable. Other: No intraperitoneal free fluid. No intraperitoneal free gas. No organized fluid collection. Musculoskeletal: Likely trace free fluid containing left inguinal hernia (2:66, 5:64). No suspicious lytic or blastic osseous lesions. No acute displaced fracture. Multilevel degenerative changes of the spine. IMPRESSION: 1. Cirrhosis and hepatic steatosis with no definite findings of portal hypertension. No focal liver lesions identified. Please note that liver protocol enhanced MR and CT are the most sensitive tests for the screening detection of hepatocellular carcinoma in the high risk setting of cirrhosis. 2. Likely trace free fluid containing left inguinal hernia 3. Uterine fibroids. 4. Prominent inferior vena cava with retrograde reflux of contrast from the right atrium. Query right heart strain. 5.  Aortic Atherosclerosis (ICD10-I70.0). Electronically Signed   By: MIven FinnM.D.   On: 10/24/2021 22:08      IMPRESSION:   GI bleed with painless large-volume hematochezia.  ?bleeding from AVM vs diverticular source?  Unlikely this represents bleeding from new/interval gastric or esophageal varices.  Another, though less likely possibility, is ulcer disease.  She does not take acid controlling medications but has no symptoms suggesting ulcer or reflux disease.  Repeated documentation of extensive colonic  pandiverticulosis..[name] SBE 02/2020 showed a trace column of nonbleeding esophageal varix.  This and other studies since then have confirmed both upper and lower AVMs.  Repeat EGD of 05/2020 did not see varices or portal hypertensive gastropathy.  Blood loss anemia.  Has not received transfusions this admission but in past years has received PRBCs.  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  Noncritical thrombocytopenia at 94 K.  Chronic Eliquis for history CVA and A-fib/flutter.  Underwent successful DC cardioversion 5/11 and currently remains in NSR with stable rate.  ESRD.  Hemodialysis Monday Wednesday Friday.    PLAN:  Case d/w Dr Lorenso Courier: EGD and Colonoscopy tomorrow.  Pt agreeable.    Added Rocephin for SBP prophylaxis.  Given she remains in NSR, should we seek cardiology guidance for required duration of Eliquis going forward?   [name]  10/25/2021, 1:34 PM Phone [phone]

## 2021-10-26 ENCOUNTER — Observation Stay (HOSPITAL_COMMUNITY): Payer: Medicare Other | Admitting: Anesthesiology

## 2021-10-26 ENCOUNTER — Encounter (HOSPITAL_COMMUNITY): Payer: Self-pay | Admitting: Internal Medicine

## 2021-10-26 ENCOUNTER — Telehealth: Payer: Self-pay

## 2021-10-26 ENCOUNTER — Encounter (HOSPITAL_COMMUNITY)
Admission: EM | Disposition: A | Discharge status: Home / Self Care | Payer: Self-pay | Source: Home / Self Care | Attending: Emergency Medicine

## 2021-10-26 ENCOUNTER — Observation Stay (HOSPITAL_BASED_OUTPATIENT_CLINIC_OR_DEPARTMENT_OTHER): Payer: Medicare Other | Admitting: Anesthesiology

## 2021-10-26 DIAGNOSIS — I8511 Secondary esophageal varices with bleeding: Secondary | ICD-10-CM | POA: Diagnosis not present

## 2021-10-26 DIAGNOSIS — K5791 Diverticulosis of intestine, part unspecified, without perforation or abscess with bleeding: Secondary | ICD-10-CM

## 2021-10-26 DIAGNOSIS — K625 Hemorrhage of anus and rectum: Secondary | ICD-10-CM

## 2021-10-26 DIAGNOSIS — K254 Chronic or unspecified gastric ulcer with hemorrhage: Secondary | ICD-10-CM | POA: Diagnosis not present

## 2021-10-26 DIAGNOSIS — K3189 Other diseases of stomach and duodenum: Secondary | ICD-10-CM | POA: Diagnosis not present

## 2021-10-26 DIAGNOSIS — K7460 Unspecified cirrhosis of liver: Secondary | ICD-10-CM

## 2021-10-26 DIAGNOSIS — K922 Gastrointestinal hemorrhage, unspecified: Secondary | ICD-10-CM | POA: Diagnosis not present

## 2021-10-26 HISTORY — PX: ESOPHAGOGASTRODUODENOSCOPY (EGD) WITH PROPOFOL: SHX5813

## 2021-10-26 HISTORY — PX: COLONOSCOPY WITH PROPOFOL: SHX5780

## 2021-10-26 LAB — RENAL FUNCTION PANEL
Albumin: 3.8 g/dL (ref 3.5–5.0)
Anion gap: 11 (ref 5–15)
BUN: 15 mg/dL (ref 8–23)
CO2: 28 mmol/L (ref 22–32)
Calcium: 8.3 mg/dL — ABNORMAL LOW (ref 8.9–10.3)
Chloride: 100 mmol/L (ref 98–111)
Creatinine, Ser: 6.29 mg/dL — ABNORMAL HIGH (ref 0.44–1.00)
GFR, Estimated: 7 mL/min — ABNORMAL LOW (ref >60)
Glucose, Bld: 82 mg/dL (ref 70–99)
Phosphorus: 3.4 mg/dL (ref 2.5–4.6)
Potassium: 3.8 mmol/L (ref 3.5–5.1)
Sodium: 139 mmol/L (ref 135–145)

## 2021-10-26 LAB — CBC
HCT: 28.5 % — ABNORMAL LOW (ref 36.0–46.0)
Hemoglobin: 9.5 g/dL — ABNORMAL LOW (ref 12.0–15.0)
MCH: 31.5 pg (ref 26.0–34.0)
MCHC: 33.3 g/dL (ref 30.0–36.0)
MCV: 94.4 fL (ref 80.0–100.0)
Platelets: 109 10*3/uL — ABNORMAL LOW (ref 150–400)
RBC: 3.02 MIL/uL — ABNORMAL LOW (ref 3.87–5.11)
RDW: 17 % — ABNORMAL HIGH (ref 11.5–15.5)
WBC: 3.9 10*3/uL — ABNORMAL LOW (ref 4.0–10.5)
nRBC: 0 % (ref 0.0–0.2)

## 2021-10-26 LAB — HEPATITIS B CORE ANTIBODY, TOTAL: Hep B Core Total Ab: REACTIVE — AB

## 2021-10-26 SURGERY — ESOPHAGOGASTRODUODENOSCOPY (EGD) WITH PROPOFOL
Anesthesia: Monitor Anesthesia Care

## 2021-10-26 MED ORDER — SODIUM CHLORIDE 0.9 % IV SOLN: INTRAVENOUS | Status: DC | PRN

## 2021-10-26 MED ORDER — PANTOPRAZOLE SODIUM 40 MG PO TBEC: 40.0000 mg | DELAYED_RELEASE_TABLET | Freq: Every day | ORAL | Status: DC

## 2021-10-26 MED ORDER — LIDOCAINE 2% (20 MG/ML) 5 ML SYRINGE
INTRAMUSCULAR | Status: DC | PRN
  Administered 2021-10-26: 40 mg via INTRAVENOUS

## 2021-10-26 MED ORDER — APIXABAN 2.5 MG PO TABS
2.5000 mg | ORAL_TABLET | Freq: Two times a day (BID) | ORAL | Status: DC
  Administered 2021-10-26 – 2021-10-27 (×2): 2.5 mg via ORAL
  Filled 2021-10-26 (×3): qty 1

## 2021-10-26 MED ORDER — PANTOPRAZOLE SODIUM 40 MG PO TBEC
40.0000 mg | DELAYED_RELEASE_TABLET | Freq: Two times a day (BID) | ORAL | Status: DC
  Administered 2021-10-26: 40 mg via ORAL
  Filled 2021-10-26: qty 1

## 2021-10-26 MED ORDER — PROPOFOL 500 MG/50ML IV EMUL
INTRAVENOUS | Status: DC | PRN
  Administered 2021-10-26: 100 ug/kg/min via INTRAVENOUS

## 2021-10-26 SURGICAL SUPPLY — 25 items

## 2021-10-26 NOTE — Progress Notes (Signed)
    Time tx completed:1859  HD treatment completed. Patient tolerated well. Fistula/Graft/HD catheter without signs and symptoms of complications. Patient transported back to the room, alert and orient and in no acute distress. Report given to bedside RN.  Total UF removed:1025m  Medication given:  Post HD VS:140/57,97.6,100%,12  Post HD weight: 57.2kg

## 2021-10-26 NOTE — Anesthesia Procedure Notes (Signed)
Procedure Name: MAC Date/Time: 10/26/2021 1:06 PM  Performed by: Reece Agar, CRNAPre-anesthesia Checklist: Patient identified, Emergency Drugs available, Suction available and Patient being monitored Patient Re-evaluated:Patient Re-evaluated prior to induction Oxygen Delivery Method: Nasal cannula

## 2021-10-26 NOTE — Interval H&P Note (Signed)
History and Physical Interval Note:  10/26/2021 12:44 PM  Morgan Clay  has presented today for surgery, with the diagnosis of Painless hematochezia.  Compensated cirrhosis.  Blood loss anemia..  The various methods of treatment have been discussed with the patient and family. After consideration of risks, benefits and other options for treatment, the patient has consented to  Procedure(s): ESOPHAGOGASTRODUODENOSCOPY (EGD) WITH PROPOFOL (N/A) COLONOSCOPY WITH PROPOFOL (N/A) as a surgical intervention.  The patient's history has been reviewed, patient examined, no change in status, stable for surgery.  I have reviewed the patient's chart and labs.  Questions were answered to the patient's satisfaction.     Sharyn Creamer

## 2021-10-26 NOTE — Evaluation (Signed)
Occupational Therapy Evaluation Patient Details Name: Morgan Clay MRN: [id number] DOB: [birth year] Today's Date: 10/26/2021   History of Present Illness Pt is 76 yo female who presents with rectal bleeding. PMH includes: atrial on chronic anticoagulation, NICM s/p ICD last EF 60-65%, ESRD on HD, cirrhosis of the liver, GI bleed, hepatitis C, s/p Harvoni treatment.   Clinical Impression   Pt admitted with the above diagnoses and presents with below problem list. Pt will benefit from continued acute OT to address the below listed deficits and maximize independence with basic ADLs prior to d/c home. At baseline, pt is independent with basic ADLs; has assist of aide 8 hours/week for household management tasks (laundry, housecleaning, some meal prep). Pt drives self to appointments. Pt currently needs up to min guard assist with LB ADLs and functional mobility, supervision for stand-pivot transfers to [facility] (finishing bowel prep this morning).        Recommendations for follow up therapy are one component of a multi-disciplinary discharge planning process, led by the attending physician.  Recommendations may be updated based on patient status, additional functional criteria and insurance authorization.   Follow Up Recommendations  No OT follow up    Assistance Recommended at Discharge PRN  Patient can return home with the following      Functional Status Assessment  Patient has had a recent decline in their functional status and demonstrates the ability to make significant improvements in function in a reasonable and predictable amount of time.  Equipment Recommendations  None recommended by OT    Recommendations for Other Services       Precautions / Restrictions Restrictions Weight Bearing Restrictions: No      Mobility Bed Mobility               General bed mobility comments: sitting EOB    Transfers Overall transfer level: Needs assistance Equipment used:  None Transfers: Sit to/from Stand, Bed to chair/wheelchair/BSC Sit to Stand: Supervision Stand pivot transfers: Min guard         General transfer comment: EOB <> BSC      Balance Overall balance assessment: Mild deficits observed, not formally tested                                         ADL either performed or assessed with clinical judgement   ADL Overall ADL's : Needs assistance/impaired Eating/Feeding: Set up;Sitting;NPO Eating/Feeding Details (indicate cue type and reason): finishing bowel prep for procudure today Grooming: Set up;Sitting   Upper Body Bathing: Set up;Sitting   Lower Body Bathing: Min guard;Sit to/from stand   Upper Body Dressing : Set up;Sitting   Lower Body Dressing: Min guard;Sit to/from stand   Toilet Transfer: Min guard;Ambulation   Toileting- Clothing Manipulation and Hygiene: Min guard;Sit to/from stand       Functional mobility during ADLs: Min guard General ADL Comments: Pt taking pivotal steps to access BSC from EOB d/t urgency from bowel prep for procedure today. Pt able to complete pericare in standing at supervision-min guard level     Vision Baseline Vision/History: 1 Wears glasses       Perception     Praxis      Pertinent Vitals/Pain Pain Assessment Pain Assessment: No/denies pain     Hand Dominance Right   Extremity/Trunk Assessment Upper Extremity Assessment Upper Extremity Assessment: Generalized weakness   Lower Extremity Assessment Lower Extremity Assessment:  Defer to PT evaluation       Communication Communication Communication: No difficulties   Cognition Arousal/Alertness: Awake/alert Behavior During Therapy: WFL for tasks assessed/performed Overall Cognitive Status: Within Functional Limits for tasks assessed                                       General Comments       Exercises     Shoulder Instructions      Home Living Family/patient expects to be  discharged to:: Private residence Living Arrangements: Children Available Help at Discharge: Family;Available PRN/intermittently Type of Home: House Home Access: Level entry     Home Layout: Two level;Able to live on main level with bedroom/bathroom     Bathroom Shower/Tub: Occupational psychologist: Handicapped height Bathroom Accessibility: Yes   Home Equipment: Conservation officer, nature (2 wheels);Rollator (4 wheels)   Additional Comments: Aide 8 hrs/week. housecleaning, meal prep. Lives with adult child who is "there when I need them."      Prior Functioning/Environment Prior Level of Function : Independent/Modified Independent                        OT Problem List: Decreased strength;Impaired balance (sitting and/or standing);Decreased activity tolerance;Decreased knowledge of precautions;Decreased knowledge of use of DME or AE      OT Treatment/Interventions: Self-care/ADL training;Therapeutic exercise;Energy conservation;DME and/or AE instruction;Therapeutic activities;Patient/family education;Balance training    OT Goals(Current goals can be found in the care plan section) Acute Rehab OT Goals Patient Stated Goal: back home OT Goal Formulation: With patient Time For Goal Achievement: 11/09/21 Potential to Achieve Goals: Good ADL Goals Pt Will Perform Grooming: with modified independence;standing Pt Will Transfer to Toilet: with modified independence;ambulating Pt Will Perform Tub/Shower Transfer: Shower transfer;with modified independence;ambulating  OT Frequency: Min 2X/week    Co-evaluation              AM-PAC OT "6 Clicks" Daily Activity     Outcome Measure Help from another person eating meals?: None Help from another person taking care of personal grooming?: None Help from another person toileting, which includes using toliet, bedpan, or urinal?: A Little Help from another person bathing (including washing, rinsing, drying)?: A Little Help from  another person to put on and taking off regular upper body clothing?: None Help from another person to put on and taking off regular lower body clothing?: None 6 Click Score: 22   End of Session    Activity Tolerance: Patient limited by fatigue Patient left: in bed;with call bell/phone within reach;Other (comment) (sitting EOB)  OT Visit Diagnosis: Unsteadiness on feet (R26.81);Muscle weakness (generalized) (M62.81)                Time: 0950-1002 OT Time Calculation (min): 12 min Charges:  OT General Charges $OT Visit: 1 Visit OT Evaluation $OT Eval Low Complexity: [place], OT Acute Rehabilitation Services Office: [phone]   Hortencia Pilar 10/26/2021, 10:16 AM

## 2021-10-26 NOTE — Telephone Encounter (Signed)
-----   Message from Sharyn Creamer, MD sent at 10/26/2021  2:03 PM EDT ----- Reed Pandy, I was able to do the EGD on your lady Ms. Wandel. No signs of EV so she can have her July EGD cancelled.  Remo Lipps, can you arrange for clinic follow up for Ms. Donath in 1 month with APP or [name]?  Thanks, Lyndee Leo

## 2021-10-26 NOTE — Progress Notes (Signed)
MOVIPREP due at 1700 for PT, but has not been sent to unit. RX informed via phone and awaiting medication to be delivered to unit.

## 2021-10-26 NOTE — Telephone Encounter (Signed)
Patient's EGD at [facility] on 7/18 has been cancelled. Patient scheduled for hospital follow up on 11/23/21 at 2:00 pm with Anderson Malta, [place]. Patient is currently in hospital, so follow up will appear on AVS discharge paperwork. Patient reminder letter has also been sent home.

## 2021-10-26 NOTE — Anesthesia Preprocedure Evaluation (Signed)
Anesthesia Evaluation  Patient identified by MRN, date of birth, ID band Patient awake    Reviewed: Allergy & Precautions, NPO status , Patient's Chart, lab work & pertinent test results  Airway Mallampati: II  TM Distance: >3 FB Neck ROM: Full    Dental no notable dental hx.    Pulmonary asthma , sleep apnea , COPD,    Pulmonary exam normal        Cardiovascular hypertension, +CHF  + dysrhythmias (on Eliquis) Atrial Fibrillation + Cardiac Defibrillator  Rhythm:Irregular Rate:Normal     Neuro/Psych Depression negative neurological ROS     GI/Hepatic (+) Cirrhosis       , Hepatitis -, CHematochezia    Endo/Other  diabetesHypothyroidism   Renal/GU ESRF and DialysisRenal disease  negative genitourinary   Musculoskeletal  (+) Arthritis , Osteoarthritis,    Abdominal Normal abdominal exam  (+)   Peds  Hematology  (+) Blood dyscrasia, anemia , Lab Results      Component                Value               Date                      WBC                      3.9 (L)             10/26/2021                HGB                      9.5 (L)             10/26/2021                HCT                      28.5 (L)            10/26/2021                MCV                      94.4                10/26/2021                PLT                      109 (L)             10/26/2021           Lab Results      Component                Value               Date                      NA                       139                 10/26/2021                K  3.8                 10/26/2021                CO2                      28                  10/26/2021                GLUCOSE                  82                  10/26/2021                BUN                      15                  10/26/2021                CREATININE               6.29 (H)            10/26/2021                CALCIUM                  8.3 (L)              10/26/2021                GFRNONAA                 7 (L)               10/26/2021             Anesthesia Other Findings   Reproductive/Obstetrics                             Anesthesia Physical Anesthesia Plan  ASA: 3  Anesthesia Plan: MAC   Post-op Pain Management:    Induction:   PONV Risk Score and Plan: 2 and Propofol infusion and Treatment may vary due to age or medical condition  Airway Management Planned: Simple Face Mask, Natural Airway and Nasal Cannula  Additional Equipment: None  Intra-op Plan:   Post-operative Plan:   Informed Consent: I have reviewed the patients History and Physical, chart, labs and discussed the procedure including the risks, benefits and alternatives for the proposed anesthesia with the patient or authorized representative who has indicated his/her understanding and acceptance.     Dental advisory given  Plan Discussed with: CRNA  Anesthesia Plan Comments: (ECHO 03/23: 1. Left ventricular ejection fraction, by estimation, is 60 to 65%. The  left ventricle has normal function. The left ventricle has no regional  wall motion abnormalities. There is moderate left ventricular hypertrophy.  2. Right ventricular systolic function is mildly reduced. The right  ventricular size is moderately enlarged.  3. Left atrial size was moderately dilated. No left atrial/left atrial  appendage thrombus was detected.  4. Right atrial size was moderately dilated.  5. The tricuspid valve is abnormal. Tricuspid valve regurgitation is  moderate to severe.  6. Inadequate coaptation of the noncoronary cusp of the aortic valve with  the other  two leaflets. Moderate to severe AI, PHT 413 msec and vena  contracta 0.49 cm (moderate AI by measurements). I did not see  holodiastolic flow reversal in the ascending  thoracic aorta. No significant aortic stenosis. The aortic valve is  tricuspid.  7. No PFO or ASD by color  doppler.  8. The mitral valve is normal in structure. Mild to moderate mitral valve  regurgitation. No evidence of mitral stenosis.  9. Normal caliber thoracic aorta with mild plaque.  10. Visually mod-severe AI, moderate by measurements. Consider cardiac MRI  to quantify AI. )        Anesthesia Quick Evaluation

## 2021-10-26 NOTE — Progress Notes (Signed)
[name] Progress Note  Name: Morgan Clay MRN: [id number] DOB: June 01, 1945  Chief Complaint:  Blood per rectum  Subjective:  No blood per rectum here.  She has been a little frustrated because she states she has a lot of wires attached (telemetry) and she feels like she's going to fall going to and from bedside commode.  Spoke with Rn and charge and they say they can't clip or rearrange the telemetry and they are going to reach out to contact primary to see if she needs it. She is awaiting EGD today.   Review of systems:  Denies n/v; several BM's overnight with bowel prep Denies shortness of breath or chest pain  -------------- Background on consult:  Morgan Clay is a 76 y.o. female with a history including ESRD on HD MWF, AICD in situ, small bowel AVM, afib, cirrhosis, HTN, and DM who presented to the hospital with blood per rectum.  GI was consulted and plans for EGD tomorrow.  She did go to HD Monday and signed off just a few minutes early as she didn't feel well and had started shaking.  She got to 57.8 kg and usually meets her EDW of 57.5 kg or gets lower. She feels ok today.   Intake/Output Summary (Last 24 hours) at 10/26/2021 0919 Last data filed at 10/26/2021 0600 Gross per 24 hour  Intake --  Output 1 ml  Net -1 ml    Vitals:  Vitals:   10/25/21 2000 10/26/21 0009 10/26/21 0415 10/26/21 0819  BP: (!) 123/94 115/76 (!) 111/48 131/65  Pulse: 93 100 90 93  Resp: '14 18 14 20  '$ Temp: 97.8 F (36.6 C) 98.4 F (36.9 C) 98.9 F (37.2 C) 97.9 F (36.6 C)  TempSrc: Oral Oral Oral Oral  SpO2: 100% 100% 99% 100%  Weight:      Height:         Physical Exam:  General adult female in bed in no acute distress HEENT normocephalic atraumatic extraocular movements intact sclera anicteric Neck supple trachea midline Lungs clear to auscultation bilaterally normal work of breathing at rest  Heart S1S2 no rub Abdomen soft nontender  nondistended Extremities no edema  Psych normal mood and affect Neuro - alert and oriented x 3 provides hx and follows commands Access LUE AVF bruit and thrill   Medications reviewed   Labs:     Latest Ref Rng & Units 10/26/2021    6:36 AM 10/25/2021    4:10 AM 10/24/2021    8:40 PM  BMP  Glucose 70 - 99 mg/dL 82  75  106   BUN 8 - 23 mg/dL '15  11  10   '$ Creatinine 0.44 - 1.00 mg/dL 6.29  4.28  3.74   Sodium 135 - 145 mmol/L 139  137  138   Potassium 3.5 - 5.1 mmol/L 3.8  3.9  3.6   Chloride 98 - 111 mmol/L 100  95  94   CO2 22 - 32 mmol/L 28  33  33   Calcium 8.9 - 10.3 mg/dL 8.3  7.8  8.2    Outpatient HD orders:  3.5 hours  BF 400 / DF 600  EDW 57.5 kg 3K/2.5 calcium AVF Last HD on 6/19 with post weight of 57.8 kg Meds: sensipar 30 mg three times a week with HD Mircera 75 mcg every 2 weeks - last given on 6/19 Hectorol 2 mcg three times a week with HD  Assessment/Plan:   # GI bleed - for  EGD today   # ESRD - HD per MWF schedule - HD after EGD today    # Anemia - acute blood loss and chronic disease - appreciate GI - note recent ESA dose of 6/19 - Hb stable    # HTN - controlled     # Metabolic bone disease  - sensipar and hectorol outpatient as above - continue when taking PO (will resume at outpatient HD unit)   Disposition - HD tomorrow after EGD then disposition per primary team   Claudia Desanctis, MD 10/26/2021 9:37 AM

## 2021-10-26 NOTE — Op Note (Signed)
[facility] Patient Name: Morgan Clay Procedure Date : 10/26/2021 MRN: [id number] Attending MD: Morgan Clay ,  Date of Birth: December 09, 1945 CSN: [id number] Age: 76 Admit Type: Inpatient Procedure:                Upper GI endoscopy Indications:              Cirrhosis rule out esophageal varices Providers:                Adline Mango" Theodis Sato, RN, Gloris Ham, Technician Referring MD:             Hospitalist team Medicines:                Monitored Anesthesia Care Complications:            No immediate complications. Estimated Blood Loss:     Estimated blood loss: none. Procedure:                After obtaining informed consent, the endoscope was                            passed under direct vision. Throughout the                            procedure, the patient's blood pressure, pulse, and                            oxygen saturations were monitored continuously. The                            [id number]-[id number] ([id number] Olympus endoscope was introduced                            through the mouth, and advanced to the second part                            of duodenum. Scope In: Scope Out: Findings:      The examined esophagus was normal.      A few localized erosions with no bleeding and no stigmata of recent       bleeding were found in the gastric antrum.      The examined duodenum was normal. Impression:               - Normal esophagus.                           - Erosive gastropathy with no bleeding and no                            stigmata of recent bleeding.                           - Normal examined duodenum.                           - No  specimens collected. Recommendation:           - Use a proton pump inhibitor PO BID for 8 weeks,                            then decrease to QD.                           - Check serum H pylori antibody.                           - Avoid NSAIDs if possible.                            - Perform a colonoscopy today. Procedure Code(s):        --- Professional ---                           [phone], Esophagogastroduodenoscopy, flexible,                            transoral; diagnostic, including collection of                            specimen(s) by brushing or washing, when performed                            (separate procedure) Diagnosis Code(s):        --- Professional ---                           K31.89, Other diseases of stomach and duodenum                           K74.60, Unspecified cirrhosis of liver CPT copyright 2019 [employer]. All rights reserved. The codes documented in this report are preliminary and upon coder review may  be revised to meet current compliance requirements. [name] "Christia Reading,  10/26/2021 1:45:30 PM Number of Addenda: 0

## 2021-10-26 NOTE — Progress Notes (Signed)
ANTICOAGULATION CONSULT NOTE - Initial Consult  Pharmacy Consult for Apixaban Indication: atrial fibrillation  No Known Allergies  Patient Measurements: Height: '4\' 11"'$  (149.9 cm) Weight: 56.2 kg (123 lb 14.4 oz) IBW/kg (Calculated) : 43.2  Vital Signs: Temp: 97.7 F (36.5 C) (06/21 1608) Temp Source: Oral (06/21 1608) BP: 153/67 (06/21 1608) Pulse Rate: 92 (06/21 1608)  Labs: Recent Labs    10/24/21 2040 10/24/21 2331 10/25/21 0410 10/25/21 1330 10/26/21 0636  HGB 9.9*   < > 9.0* 9.7* 9.5*  HCT 29.6*   < > 27.5* 29.3* 28.5*  PLT 96*  --  94*  --  109*  CREATININE 3.74*  --  4.28*  --  6.29*   < > = values in this interval not displayed.  ESRD  Medical History: Past Medical History:  Diagnosis Date   Acute blood loss anemia 09/24/2020   AICD (automatic cardioverter/defibrillator) present 10/06/2015   Anemia due to chronic blood loss 04/15/2020   Anemia in chronic kidney disease 01/18/2018   Angiodysplasia of small intestine 06/10/2020   Ileum - seen on capsule endoscopy 05/2020 - ablated at colonoscopy   Aortic atherosclerosis ([place]) 08/30/2017   Arthritis    "qwhere" (01/03/2018)   Asthma    reports mild asthma since childhood - had COPD on dx list from prior PCP   Atrial fibrillation with RVR ([place]) 11/17/2020   Atrial fibrillation, chronic 10/25/2017   Atypical atrial flutter ([place]) 09/28/2015   AVM (arteriovenous malformation) of small bowel, acquired 01/14/2018   Biventricular ICD (implantable cardioverter-defibrillator) in place 01/08/2007   Qualifier: Diagnosis of  By: Hassell Done FNP, Nykedtra     Bleeding pseudoaneurysm of left brachiocephalic arteriovenous fistula ([place]) 09/24/2020   Bleeding pseudoaneurysm of left brachiocephalic AV fistula ([place]) 09/24/2020   Carpal tunnel syndrome, bilateral 06/09/2021   Chronic bronchitis ([place])    "get it most years; not this past year though" (01/03/2018)   Chronic diastolic CHF (congestive heart failure) ([place]) 04/24/2007    Annotation: secondary to nonischemic cardiomyopathy s/p CRT-D Followed by Dr. Caryl Comes in Cardiology Qualifier: Diagnosis of  By: Hassell Done FNP, Nykedtra     Chronic obstructive pulmonary disease ([place])    Chronic systolic congestive heart failure ([place])    CKD (chronic kidney disease) stage 4, GFR 15-29 ml/min  11/24/2013   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 (HCV) ([place])    HEPATITIS C - s/p treatment with Harvoni, saw hepatology, Dawn Drazek   COVID-19 virus infection 05/27/2020   Diabetes mellitus without complication ([place])    DIET CONTROLLED    Dialysis complication    Diverticulitis of left colon status post robotic low anterior to sigmoid resection 05/22/2018 07/31/2017   End stage renal disease ([place]) 03/22/2021   FIBROIDS, UTERUS 03/05/2008   Glaucoma    Gout    History of blood transfusion ~ 11/2017   Hypertension associated with diabetes ([place]) 06/07/2017   Hypothyroidism    LBBB (left bundle branch block)    Lesion of ulnar nerve 06/09/2021   Long term current use of anticoagulant    Lower GI bleeding    "been dealing w/it since 07/2017" (01/03/2018)   Malnutrition of moderate degree 07/09/2020   MDD (major depressive disorder), recurrent, in partial remission ([place]) 06/07/2017   Nonischemic cardiomyopathy ([place])    Obesity 05/27/2009   Qualifier: Diagnosis of  By: Hassell Done FNP, Nykedtra     OBSTRUCTIVE SLEEP APNEA 11/14/2007   no CPAP   OSTEOPENIA 09/30/2008   Other cirrhosis of liver ([place]) 08/12/2020  Formatting of this note is different from the original. Patient with history of cirrhosis secondary to hepatitis C with no history of hepatic decompensation.  She has had some hyponatremia and hypoalbuminemia in the past however most recent labs show relatively well-preserved liver synthetic function.  She will have labs to update liver synthetic function.  Hepatoma screening - patient is currentl   Overweight (BMI 25.0-29.9) 03/02/2020   Pain in right knee 05/13/2018    Persistent atrial fibrillation ([place]) 02/06/2020   Pneumonia    "several times" (01/03/2018)   Polyneuropathy associated with underlying disease ([place]) 06/09/2021   Pulmonary hypertension ([place]) on echocardiogram 01/14/2018   Secondary esophageal varices without bleeding ([place]) 12/23/2020   Formatting of this note might be different from the original. Patient had trace esophageal varices during small bowel enteroscopy in October 2021, follow-up EGD in January 2022 with no varices.  She previously had GI bleeding thought to be from AVM versus GAVE.  She is no longer on carvedilol and will need surveillance endoscopy.  Patient was previously followed at [facility] and I have asked her    Secondary hypercoagulable state ([place]) 02/06/2020   Type 2 diabetes, controlled, with renal manifestation ([place]) 11/24/2013   Assessment:  76 yr old female on Eliquis 5 mg BID PTA for atrial fibrillation.  Admitted 10/25/21 with rectal bleeding and Eliquis held.  S/p EGD and colonoscopy today. Bleeding noted resolved, and may resume Eliquis if agreeable.  Erosive gastropathy with no bleeding, diverticulosis, internal hemorrhoids. Hx ileal AVM. Patient reports prior episodes of bleeding and Eliquis held and resumed. S/p DCCV on 09/15/21, and it was her understanding that she would continue Eliquis for about a month after the procedure.  She is aware of the risk of stroke due to atrial fibrillation.  Anemia, Hgb 8.9-9.7 since admitted.  Platelet count is low.   ESRD, dry weight 57.5 kg. We discussed reduced dose Eliquis. Patient is agreeable to starting tonight, and will report any re-bleeding.   Goal of Therapy:  Appropriate Eliquis regimen for indication Monitor platelets by anticoagulation protocol: Yes   Plan:  Resume Eliquis tonight with 2.5 mg BID. Monitor for bleeding. Follow up CBCs.  Arty Baumgartner, RPh 10/26/2021,5:10 PM

## 2021-10-26 NOTE — Care Management (Signed)
  Transition of Care [facility]) Screening Note   Patient Details  Name: Morgan Clay Date of Birth: 07-02-1945   [facility]) CM/SW Contact:    Carles Collet, RN Phone Number: 10/26/2021, 7:38 AM    Transition of Care Department [facility]) has reviewed patient and we will continue to monitor patient advancement through interdisciplinary progression rounds. If new patient transition needs arise, please place a TOC consult.

## 2021-10-26 NOTE — Progress Notes (Addendum)
PROGRESS NOTE                                                                                                                                                                                                             Patient Demographics:    Morgan Clay, is a 76 y.o. female, DOB - 06/12/1945, SJG:[id number]  Outpatient Primary MD for the patient is Saguier, [name]    LOS - 0  Admit date - 10/24/2021    Chief Complaint  Patient presents with   Rectal Bleeding       Brief Narrative (HPI from H&P)   76 y.o. female with medical history significant of atrial on chronic anticoagulation, NICM s/p ICD last EF 60-65%, ESRD on HD (MWF) followed by Dr. Moshe Cipro, cirrhosis of the liver, GI bleed, hepatitis C s/p Harvoni treatment who presents for rectal bleeding   Subjective:    Morgan Clay today has, No headache, No chest pain, No abdominal pain - No Nausea, No new weakness tingling or numbness, no SOB.   Assessment  & Plan :   Acute blood loss anemia secondary to GI bleed GI bleed previous history of AVMs. Patient presents with 3-4 episodes of bright red blood per rectum, overall H&H is stable, Eliquis on hold, oral PPI.  GI on board with EGD colonoscopy today.  If stable findings then likely rechallenge with Eliquis monitor 24 hours and then discharge home.   Atrial flutter/atrial fibrillation on chronic anticoagulation Follows with Dr. Loralie Champagne, on amiodarone which will be continued, Eliquis on hold due to GI bleed.Patient wants to hold off Eliquis for today, she may not resume it at all,will discuss again on 10/27/21.   Transient hypotension Blood pressures were noted to drop as low as 106/43 in the ED.  Currently blood pressures maintained with maps greater than 65.  Hold BiDil and reassess when medically appropriate to resume   History of nonischemic cardiomyopathy s/p AICD At this time patient  does not appear to be fluid overload.  Last echocardiogram revealed EF of 60 to 65% in 07/2021.  Fluid removal via HD.   COPD, without acute exacerbation Patient without significant wheezing on physical exam. -Breathing treatments as needed   Thrombocytopenia Acute on chronic.  Platelet count 96->94. -Continue to monitor   ESRD on HD Nephrology on board, on MWF schedule.  Compensated cirrhosis related to hepatitis C Patient is status post treatment with Harvoni.  Outpatient follow-up with ID and GI as needed.   Anxiety -Continue Valium as needed   Hypothyroidism TSH 4.89 on 6/1 -Continue levothyroxine        Condition - Fair  Family Communication  :  None  Code Status :  Full  Consults  :  Renal, GI  PUD Prophylaxis : PPI   Procedures  :     CT - 1. Cirrhosis and hepatic steatosis with no definite findings of portal hypertension. No focal liver lesions identified. Please note that liver protocol enhanced MR and CT are the most sensitive tests for the screening detection of hepatocellular carcinoma in the high risk setting of cirrhosis. 2. Likely trace free fluid containing left inguinal hernia 3. Uterine fibroids. 4. Prominent inferior vena cava with retrograde reflux of contrast from the right atrium. Query right heart strain. 5.  Aortic Atherosclerosis (ICD10-I70.0).  Colonoscopy -       Disposition Plan  :    Status is: Observation  DVT Prophylaxis  :    SCDs Start: 10/25/21 1315  Lab Results  Component Value Date   PLT 109 (L) 10/26/2021    Diet :  Diet Order             Diet NPO time specified  Diet effective 0500 tomorrow                    Inpatient Medications  Scheduled Meds:  amiodarone  200 mg Oral Daily   Chlorhexidine Gluconate Cloth  6 each Topical Q0600   gabapentin  300 mg Oral Daily   levothyroxine  137 mcg Oral QAC breakfast   lidocaine-prilocaine  1 Application Topical Once per day on Mon Wed Fri   sodium chloride flush   3 mL Intravenous Q12H   Continuous Infusions:  cefTRIAXone (ROCEPHIN)  IV 1 g (10/25/21 1605)   PRN Meds:.acetaminophen **OR** acetaminophen, albuterol, diazepam, ondansetron **OR** ondansetron (ZOFRAN) IV, traZODone  Antibiotics  :    Anti-infectives (From admission, onward)    Start     Dose/Rate Route Frequency Ordered Stop   10/25/21 1530  cefTRIAXone (ROCEPHIN) 1 g in sodium chloride 0.9 % 100 mL IVPB        1 g 200 mL/hr over 30 Minutes Intravenous Every 24 hours 10/25/21 1437          Time Spent in minutes  30   Lala Lund M.D on 10/26/2021 at 10:00 AM  To page go to [email or web address]   [facility] -  Office  [phone]  See all Orders from today for further details    Objective:   Vitals:   10/25/21 2000 10/26/21 0009 10/26/21 0415 10/26/21 0819  BP: (!) 123/94 115/76 (!) 111/48 131/65  Pulse: 93 100 90 93  Resp: '14 18 14 20  '$ Temp: 97.8 F (36.6 C) 98.4 F (36.9 C) 98.9 F (37.2 C) 97.9 F (36.6 C)  TempSrc: Oral Oral Oral Oral  SpO2: 100% 100% 99% 100%  Weight:      Height:        Wt Readings from Last 3 Encounters:  10/24/21 57.2 kg  10/20/21 56.5 kg  10/06/21 57.2 kg     Intake/Output Summary (Last 24 hours) at 10/26/2021 1000 Last data filed at 10/26/2021 0600 Gross per 24 hour  Intake --  Output 1 ml  Net -1 ml     Physical Exam  Awake Alert, No  new F.N deficits, Normal affect [place].AT,PERRAL Supple Neck, No JVD,   Symmetrical Chest wall movement, Good air movement bilaterally, CTAB RRR,No Gallops,Rubs or new Murmurs,  +ve B.Sounds, Abd Soft, No tenderness,   No Cyanosis, Clubbing or edema       Data Review:    CBC Recent Labs  Lab 10/24/21 2040 10/24/21 2331 10/25/21 0410 10/25/21 1330 10/26/21 0636  WBC 5.4  --  5.1  --  3.9*  HGB 9.9* 8.9* 9.0* 9.7* 9.5*  HCT 29.6* 27.3* 27.5* 29.3* 28.5*  PLT 96*  --  94*  --  109*  MCV 94.0  --  93.2  --  94.4  MCH 31.4  --  30.5  --  31.5  MCHC 33.4  --  32.7  --  33.3   RDW 16.7*  --  16.8*  --  17.0*  LYMPHSABS 1.0  --  1.5  --   --   MONOABS 0.6  --  0.5  --   --   EOSABS 0.1  --  0.2  --   --   BASOSABS 0.0  --  0.0  --   --     Electrolytes Recent Labs  Lab 10/24/21 2040 10/25/21 0410 10/26/21 0636  NA 138 137 139  K 3.6 3.9 3.8  CL 94* 95* 100  CO2 33* 33* 28  GLUCOSE 106* 75 82  BUN '10 11 15  '$ CREATININE 3.74* 4.28* 6.29*  CALCIUM 8.2* 7.8* 8.3*  AST 22  --   --   ALT 12  --   --   ALKPHOS 73  --   --   BILITOT 0.6  --   --   ALBUMIN 4.1 3.6 3.8    ------------------------------------------------------------------------------------------------------------------ No results for input(s): "CHOL", "HDL", "LDLCALC", "TRIG", "CHOLHDL", "LDLDIRECT" in the last 72 hours.  Lab Results  Component Value Date   HGBA1C 4.9 10/06/2021    No results for input(s): "TSH", "T4TOTAL", "T3FREE", "THYROIDAB" in the last 72 hours.  Invalid input(s): "FREET3" ------------------------------------------------------------------------------------------------------------------ ID Labs Recent Labs  Lab 10/24/21 2040 10/25/21 0410 10/26/21 0636  WBC 5.4 5.1 3.9*  PLT 96* 94* 109*  CREATININE 3.74* 4.28* 6.29*   Cardiac Enzymes No results for input(s): "CKMB", "TROPONINI", "MYOGLOBIN" in the last 168 hours.  Invalid input(s): "CK"   Micro Results No results found for this or any previous visit (from the past 240 hour(s)).  Radiology Reports CT ABDOMEN PELVIS W CONTRAST  Result Date: 10/24/2021 CLINICAL DATA:  LLQ abdominal pain--patient has been on dialysis for a year and a half- EXAM: CT ABDOMEN AND PELVIS WITH CONTRAST TECHNIQUE: Multidetector CT imaging of the abdomen and pelvis was performed using the standard protocol following bolus administration of intravenous contrast. RADIATION DOSE REDUCTION: This exam was performed according to the departmental dose-optimization program which includes automated exposure control, adjustment of the mA  and/or kV according to patient size and/or use of iterative reconstruction technique. CONTRAST:  195m OMNIPAQUE IOHEXOL 300 MG/ML  SOLN COMPARISON:  None Available. FINDINGS: Lower chest: Cardiac device leads partially visualized. No acute abnormality. Hepatobiliary: Nodular hepatic contour. The hepatic parenchyma is diffusely hypodense compared to the splenic parenchyma consistent with fatty infiltration. No focal liver abnormality. No gallstones, gallbladder wall thickening, or pericholecystic fluid. No biliary dilatation. Pancreas: No focal lesion. Normal pancreatic contour. No surrounding inflammatory changes. No main pancreatic ductal dilatation. Spleen: Normal in size without focal abnormality. Adrenals/Urinary Tract: No adrenal nodule bilaterally. Bilateral kidneys enhance symmetrically. No hydronephrosis. No hydroureter. The urinary bladder  is unremarkable. Stomach/Bowel: Query partial colectomy. Stomach is within normal limits. No evidence of bowel wall thickening or dilatation. Status post appendectomy Vascular/Lymphatic: Prominent inferior vena cava with retrograde reflux of contrast from the right atrium. No abdominal aorta or iliac aneurysm. Mild to moderate atherosclerotic plaque of the aorta and its branches. No abdominal, pelvic, or inguinal lymphadenopathy. Reproductive: Several calcified lesions within the urine walls measuring to 5.2 cm. Bilateral adnexa are unremarkable. Other: No intraperitoneal free fluid. No intraperitoneal free gas. No organized fluid collection. Musculoskeletal: Likely trace free fluid containing left inguinal hernia (2:66, 5:64). No suspicious lytic or blastic osseous lesions. No acute displaced fracture. Multilevel degenerative changes of the spine. IMPRESSION: 1. Cirrhosis and hepatic steatosis with no definite findings of portal hypertension. No focal liver lesions identified. Please note that liver protocol enhanced MR and CT are the most sensitive tests for the  screening detection of hepatocellular carcinoma in the high risk setting of cirrhosis. 2. Likely trace free fluid containing left inguinal hernia 3. Uterine fibroids. 4. Prominent inferior vena cava with retrograde reflux of contrast from the right atrium. Query right heart strain. 5.  Aortic Atherosclerosis (ICD10-I70.0). Electronically Signed   By: Iven Finn M.D.   On: 10/24/2021 22:08

## 2021-10-26 NOTE — Op Note (Signed)
[facility] Patient Name: Morgan Clay Procedure Date : 10/26/2021 MRN: [id number] Attending MD: Georgian Co ,  Date of Birth: December 25, 1945 CSN: [id number] Age: 76 Admit Type: Inpatient Procedure:                Colonoscopy Indications:              Rectal bleeding Providers:                Adline Mango" Theodis Sato, RN, Gloris Ham, Technician Referring MD:             Hospitalist team Medicines:                Monitored Anesthesia Care Complications:            No immediate complications. Estimated Blood Loss:     Estimated blood loss: none. Procedure:                Pre-Anesthesia Assessment:                           - Prior to the procedure, a History and Physical                            was performed, and patient medications and                            allergies were reviewed. The patient's tolerance of                            previous anesthesia was also reviewed. The risks                            and benefits of the procedure and the sedation                            options and risks were discussed with the patient.                            All questions were answered, and informed consent                            was obtained. Prior Anticoagulants: The patient has                            taken no previous anticoagulant or antiplatelet                            agents. ASA Grade Assessment: III - A patient with                            severe systemic disease. After reviewing the risks  and benefits, the patient was deemed in                            satisfactory condition to undergo the procedure.                           After obtaining informed consent, the colonoscope                            was passed under direct vision. Throughout the                            procedure, the patient's blood pressure, pulse, and                            oxygen  saturations were monitored continuously. The                            [id number]-HQ190L ([id number] Olympus colonoscope was                            introduced through the anus and advanced to the the                            terminal ileum. The colonoscopy was performed                            without difficulty. The patient tolerated the                            procedure well. The quality of the bowel                            preparation was good. The terminal ileum, ileocecal                            valve, appendiceal orifice, and rectum were                            photographed. Scope In: 1:27:59 PM Scope Out: 1:38:05 PM Scope Withdrawal Time: 0 hours 8 minutes 0 seconds  Total Procedure Duration: 0 hours 10 minutes 6 seconds  Findings:      The terminal ileum appeared normal.      Multiple small and large-mouthed diverticula were found in the       descending colon and cecum.      There was evidence of a prior surgical anastomosis in the recto-sigmoid       colon. This was patent. The anastomosis was traversed.      Internal hemorrhoids were found during retroflexion. Impression:               - The examined portion of the ileum was normal.                           - Diverticulosis in the descending colon and in the  cecum.                           - Patent surgical anastomosis.                           - Internal hemorrhoids.                           - No specimens collected. Recommendation:           - Return patient to hospital ward for ongoing care.                           - It is suspected that the patient had a                            diverticular bleed that has now resolved. Would be                            okay to restart the patient on Eliquis.                           - Await pathology results.                           - The findings and recommendations were discussed                            with the  patient. Procedure Code(s):        --- Professional ---                           [phone], Colonoscopy, flexible; diagnostic, including                            collection of specimen(s) by brushing or washing,                            when performed (separate procedure) Diagnosis Code(s):        --- Professional ---                           K64.8, Other hemorrhoids                           Z98.0, Intestinal bypass and anastomosis status                           K62.5, Hemorrhage of anus and rectum                           K57.30, Diverticulosis of large intestine without                            perforation or abscess without bleeding CPT copyright 2019 [employer]. All rights reserved. The codes documented in this report  are preliminary and upon coder review may  be revised to meet current compliance requirements. Morgan Clay "Morgan Clay,  10/26/2021 2:01:10 PM Number of Addenda: 0

## 2021-10-26 NOTE — Care Management Obs Status (Signed)
[place] NOTIFICATION   Patient Details  Name: Morgan Clay MRN: [id number] Date of Birth: Jan 26, 1946   Medicare Observation Status Notification Given:  Yes    Carles Collet, RN 10/26/2021, 2:39 PM

## 2021-10-26 NOTE — Transfer of Care (Signed)
Immediate Anesthesia Transfer of Care Note  Patient: Morgan Clay  Procedure(s) Performed: ESOPHAGOGASTRODUODENOSCOPY (EGD) WITH PROPOFOL COLONOSCOPY WITH PROPOFOL  Patient Location: PACU  Anesthesia Type:MAC  Level of Consciousness: awake and alert   Airway & Oxygen Therapy: Patient Spontanous Breathing  Post-op Assessment: Report given to RN and Post -op Vital signs reviewed and stable  Post vital signs: Reviewed and stable  Last Vitals:  Vitals Value Taken Time  BP 110/54 10/26/21 1349  Temp    Pulse 90 10/26/21 1351  Resp 16 10/26/21 1351  SpO2 93 10/26/21 1351  Vitals shown include unvalidated device data.  Last Pain:  Vitals:   10/26/21 1218  TempSrc: Temporal  PainSc: 0-No pain         Complications: No notable events documented.

## 2021-10-26 NOTE — Progress Notes (Signed)
Pt receives out-pt HD at [facility] on MWF. Pt has an 11:40 chair time. Will assist as needed.   Thos Matsumoto Renal Navigator [phone] 

## 2021-10-26 NOTE — Evaluation (Signed)
Physical Therapy Evaluation Patient Details Name: Morgan Clay MRN: [id number] DOB: [birth year] Today's Date: 10/26/2021  History of Present Illness  76 y/o female presented to ED on 10/24/21 for bright red bleeding with clots from rectum. Admitted for possible GI bleed. Awaiting colonscopy. PMH: Liver cirrhosis, ESRD on HD, Afib, hx of CVA, anemia, T2DM, HTN  Clinical Impression  Patient admitted with the above. PTA, patient lives with adult child and was independent with all mobility. Patient currently presents with weakness, impaired balance, and decreased activity tolerance. Patient recently receiving bowel prep for upcoming procedure. Patient declined ambulation this date but was able to transfer to/from [facility] with supervision. Will continue to assess next session. Patient will benefit from skilled PT services during acute stay to address listed deficits. No PT follow up recommended at this time.      Recommendations for follow up therapy are one component of a multi-disciplinary discharge planning process, led by the attending physician.  Recommendations may be updated based on patient status, additional functional criteria and insurance authorization.  Follow Up Recommendations No PT follow up      Assistance Recommended at Discharge PRN  Patient can return home with the following       Equipment Recommendations None recommended by PT  Recommendations for Other Services       Functional Status Assessment Patient has had a recent decline in their functional status and demonstrates the ability to make significant improvements in function in a reasonable and predictable amount of time.     Precautions / Restrictions Precautions Precautions: Fall Restrictions Weight Bearing Restrictions: No      Mobility  Bed Mobility               General bed mobility comments: sitting on BSC    Transfers Overall transfer level: Needs assistance Equipment used: None Transfers:  Sit to/from Stand, Bed to chair/wheelchair/BSC Sit to Stand: Supervision Stand pivot transfers: Supervision         General transfer comment: transferred BSC<>EOB with supervision    Ambulation/Gait               General Gait Details: patient declined due to bowel prep and needing to have BM frequently  Stairs            Wheelchair Mobility    Modified Rankin (Stroke Patients Only)       Balance Overall balance assessment: Mild deficits observed, not formally tested                                           Pertinent Vitals/Pain      Home Living Family/patient expects to be discharged to:: Private residence Living Arrangements: Children Available Help at Discharge: Family;Available PRN/intermittently Type of Home: House Home Access: Level entry       Home Layout: Two level;Able to live on main level with bedroom/bathroom Home Equipment: Rolling Jamelle [place]ston (2 wheels);Rollator (4 wheels) Additional Comments: Aide 8 hrs/week. housecleaning, meal prep. Lives with adult child who is "there when I need them."    Prior Function Prior Level of Function : Independent/Modified Independent                     Hand Dominance   Dominant Hand: Right    Extremity/Trunk Assessment   Upper Extremity Assessment Upper Extremity Assessment: Defer to OT evaluation    Lower Extremity Assessment  Lower Extremity Assessment: Generalized weakness       Communication   Communication: No difficulties  Cognition Arousal/Alertness: Awake/alert Behavior During Therapy: WFL for tasks assessed/performed Overall Cognitive Status: Within Functional Limits for tasks assessed                                          General Comments      Exercises     Assessment/Plan    PT Assessment Patient needs continued PT services  PT Problem List Decreased strength;Decreased activity tolerance;Decreased balance;Decreased mobility        PT Treatment Interventions DME instruction;Gait training;Functional mobility training;Therapeutic activities;Therapeutic exercise;Balance training    PT Goals (Current goals can be found in the Care Plan section)  Acute Rehab PT Goals Patient Stated Goal: to go home as soon as possible PT Goal Formulation: With patient Time For Goal Achievement: 11/09/21 Potential to Achieve Goals: Good    Frequency Min 3X/week     Co-evaluation               AM-PAC PT "6 Clicks" Mobility  Outcome Measure Help needed turning from your back to your side while in a flat bed without using bedrails?: A Little Help needed moving from lying on your back to sitting on the side of a flat bed without using bedrails?: A Little Help needed moving to and from a bed to a chair (including a wheelchair)?: A Little Help needed standing up from a chair using your arms (e.g., wheelchair or bedside chair)?: A Little Help needed to walk in hospital room?: A Little Help needed climbing 3-5 steps with a railing? : A Lot 6 Click Score: 17    End of Session   Activity Tolerance: Patient tolerated treatment well Patient left: Other (comment);with call bell/phone within reach (on [facility]) Nurse Communication: Mobility status PT Visit Diagnosis: Muscle weakness (generalized) (M62.81)    Time: 0981-1914 PT Time Calculation (min) (ACUTE ONLY): 16 min   Charges:   PT Evaluation $PT Eval Low Complexity: 1 Low          Eulon Allnutt A. [name] PT, DPT Acute Rehabilitation Services Office [phone]   Linna Hoff 10/26/2021, 11:59 AM

## 2021-10-26 NOTE — Progress Notes (Signed)
Patient refused to take the second half of bowel prep. I explained to the patient that stools must be clear for the procedure to be performed. She states that her stools are clear and she is not going to drink anymore of the prep. MD and oncoming RN will be notified.

## 2021-10-27 ENCOUNTER — Other Ambulatory Visit (HOSPITAL_COMMUNITY): Payer: Self-pay

## 2021-10-27 ENCOUNTER — Encounter: Payer: Self-pay | Admitting: Oncology

## 2021-10-27 DIAGNOSIS — K922 Gastrointestinal hemorrhage, unspecified: Secondary | ICD-10-CM | POA: Diagnosis not present

## 2021-10-27 LAB — CBC WITH DIFFERENTIAL/PLATELET
Abs Immature Granulocytes: 0.02 10*3/uL (ref 0.00–0.07)
Basophils Absolute: 0 10*3/uL (ref 0.0–0.1)
Basophils Relative: 1 %
Eosinophils Absolute: 0.1 10*3/uL (ref 0.0–0.5)
Eosinophils Relative: 4 %
HCT: 26.8 % — ABNORMAL LOW (ref 36.0–46.0)
Hemoglobin: 8.9 g/dL — ABNORMAL LOW (ref 12.0–15.0)
Immature Granulocytes: 1 %
Lymphocytes Relative: 31 %
Lymphs Abs: 1.2 10*3/uL (ref 0.7–4.0)
MCH: 31 pg (ref 26.0–34.0)
MCHC: 33.2 g/dL (ref 30.0–36.0)
MCV: 93.4 fL (ref 80.0–100.0)
Monocytes Absolute: 0.6 10*3/uL (ref 0.1–1.0)
Monocytes Relative: 15 %
Neutro Abs: 2 10*3/uL (ref 1.7–7.7)
Neutrophils Relative %: 48 %
Platelets: 124 10*3/uL — ABNORMAL LOW (ref 150–400)
RBC: 2.87 MIL/uL — ABNORMAL LOW (ref 3.87–5.11)
RDW: 16.8 % — ABNORMAL HIGH (ref 11.5–15.5)
WBC: 3.9 10*3/uL — ABNORMAL LOW (ref 4.0–10.5)
nRBC: 0.5 % — ABNORMAL HIGH (ref 0.0–0.2)

## 2021-10-27 LAB — RENAL FUNCTION PANEL
Albumin: 3.4 g/dL — ABNORMAL LOW (ref 3.5–5.0)
Anion gap: 11 (ref 5–15)
BUN: 9 mg/dL (ref 8–23)
CO2: 30 mmol/L (ref 22–32)
Calcium: 8.2 mg/dL — ABNORMAL LOW (ref 8.9–10.3)
Chloride: 94 mmol/L — ABNORMAL LOW (ref 98–111)
Creatinine, Ser: 4.71 mg/dL — ABNORMAL HIGH (ref 0.44–1.00)
GFR, Estimated: 9 mL/min — ABNORMAL LOW (ref >60)
Glucose, Bld: 84 mg/dL (ref 70–99)
Phosphorus: 3.1 mg/dL (ref 2.5–4.6)
Potassium: 3.4 mmol/L — ABNORMAL LOW (ref 3.5–5.1)
Sodium: 135 mmol/L (ref 135–145)

## 2021-10-27 LAB — HEPATITIS B SURFACE ANTIBODY, QUANTITATIVE: Hep B S AB Quant (Post): 52.5 m[IU]/mL (ref >9.9)

## 2021-10-27 MED ORDER — APIXABAN 2.5 MG PO TABS
2.5000 mg | ORAL_TABLET | Freq: Two times a day (BID) | ORAL | 0 refills | Status: DC
  Filled 2021-10-27: qty 60, 30d supply, fill #0

## 2021-10-27 MED ORDER — NITROGLYCERIN 0.4 MG SL SUBL
0.4000 mg | SUBLINGUAL_TABLET | SUBLINGUAL | 0 refills | Status: DC | PRN
  Filled 2021-10-27: qty 25, 30d supply, fill #0

## 2021-10-27 MED ORDER — PANTOPRAZOLE SODIUM 40 MG PO TBEC
40.0000 mg | DELAYED_RELEASE_TABLET | Freq: Every day | ORAL | 0 refills | Status: DC
  Filled 2021-10-27: qty 30, 30d supply, fill #0

## 2021-10-27 MED ORDER — PANTOPRAZOLE SODIUM 40 MG PO TBEC
40.0000 mg | DELAYED_RELEASE_TABLET | Freq: Two times a day (BID) | ORAL | 2 refills | Status: DC
  Filled 2021-10-27: qty 30, 15d supply, fill #0

## 2021-10-27 MED ORDER — DOCUSATE SODIUM 100 MG PO CAPS
100.0000 mg | ORAL_CAPSULE | Freq: Every day | ORAL | 0 refills | Status: AC
  Filled 2021-10-27: qty 30, 30d supply, fill #0

## 2021-10-27 NOTE — Progress Notes (Signed)
D/C order noted. Contacted [facility] SW to advise clinic of pt's d/c today and that pt will resume care tomorrow.   Kaydenn Mclear Renal Navigator [phone] 

## 2021-10-27 NOTE — Discharge Summary (Signed)
Morgan Clay XAJ:[id number] DOB: 29-Jan-1946 DOA: 10/24/2021  PCP: Mackie Pai, PA-C  Admit date: 10/24/2021  Discharge date: 10/27/2021  Admitted From: Home   Disposition:  Home   Recommendations for Outpatient Follow-up:   Follow up with PCP in 1-2 weeks  PCP Please obtain BMP/CBC, 2 view CXR in 1week,  (see Discharge instructions)   PCP Please follow up on the following pending results: Monitor H&H please arrange for GI follow-up within a month   Home Health: None Equipment/Devices: None  Consultations: GI, Renal Discharge Condition: Stable  CODE STATUS: Full    Diet Recommendation: Renal with 1.5 L fluid restriction per day  Chief Complaint  Patient presents with   Rectal Bleeding     Brief history of present illness from the day of admission and additional interim summary     76 y.o. female with medical history significant of atrial on chronic anticoagulation, NICM s/p ICD last EF 60-65%, ESRD on HD (MWF) followed by Dr. Moshe Cipro, cirrhosis of the liver, GI bleed, hepatitis C s/p Harvoni treatment who presents for rectal bleeding                                                                 Hospital Course    Acute blood loss anemia secondary to GI bleed GI bleed previous history of AVMs. - Patient presents with 3-4 episodes of bright red blood per rectum, overall H&H is stable, Eliquis was held and she was placed on PPI she was seen by GI underwent EGD colonoscopy which showed some diverticulosis and erosive gastropathy but no signs of acute bleeding.  She likely had diverticular bleed.  Did not require any transfusions H&H stable, case discussed with GI to be discharged on twice daily PPI with outpatient PCP follow-up and GI follow-up in 4 to 6 weeks.   Atrial flutter/atrial fibrillation on  chronic anticoagulation Follows with Dr. Loralie Champagne, on amiodarone which will be continued, Eliquis resumed at renal dose.   Transient hypotension Chronic hypotension stable and symptom-free.  Continue home regimen.  Patient unsure if she was taking BiDil at home, this was listed in her home medication currently on hold as blood pressure soft.  PCP to monitor.   History of nonischemic cardiomyopathy s/p AICD At this time patient does not appear to be fluid overload.  Last echocardiogram revealed EF of 60 to 65% in 07/2021.  Fluid removal via HD.   COPD, without acute exacerbation Patient without significant wheezing on physical exam. -Breathing treatments as needed   Thrombocytopenia Acute on chronic.  Platelet count 96->94. -Continue to monitor   ESRD on HD Nephrology on board, on MWF schedule.  Was dialyzed on 10/26/2021.   Compensated cirrhosis related to hepatitis C Patient is status post treatment with Harvoni.  Outpatient follow-up with ID and  GI as needed.   Anxiety -Continue Valium as needed   Hypothyroidism TSH 4.89 on 6/1 -Continue levothyroxine   Discharge diagnosis     Principal Problem:   Acute lower GI bleeding Active Problems:   Acute blood loss anemia   Atrial fibrillation, chronic   Chronic anticoagulation   Hypothyroidism   Biventricular ICD (implantable cardioverter-defibrillator) in place   Nonischemic cardiomyopathy ([place])   Chronic obstructive pulmonary disease ([place])   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 (HCV) ([place])   ESRD on dialysis ([place])   Transient hypotension    Discharge instructions    Discharge Instructions     Discharge instructions   Complete by: As directed    Follow with Primary MD Saguier, Percell Miller, PA-C in 7 days   Get CBC, CMP, anemia panel-  checked next visit within 1 week by Primary MD    Activity: As tolerated with Full fall precautions use walker/cane & assistance as needed  Disposition Home   Diet:  Renal diet with 1.5 L fluid restriction per day  Special Instructions: If you have smoked or chewed Tobacco  in the last 2 yrs please stop smoking, stop any regular Alcohol  and or any Recreational drug use.  On your next visit with your primary care physician please Get Medicines reviewed and adjusted.  Please request your Prim.MD to go over all Hospital Tests and Procedure/Radiological results at the follow up, please get all Hospital records sent to your Prim MD by signing hospital release before you go home.  If you experience worsening of your admission symptoms, develop shortness of breath, life threatening emergency, suicidal or homicidal thoughts you must seek medical attention immediately by calling 911 or calling your MD immediately  if symptoms less severe.  You Must read complete instructions/literature along with all the possible adverse reactions/side effects for all the Medicines you take and that have been prescribed to you. Take any new Medicines after you have completely understood and accpet all the possible adverse reactions/side effects.   Increase activity slowly   Complete by: As directed        Discharge Medications   Allergies as of 10/27/2021   No Known Allergies      Medication List     STOP taking these medications    isosorbide-hydrALAZINE 20-37.5 MG tablet Commonly known as: BIDIL       TAKE these medications    amiodarone 200 MG tablet Commonly known as: PACERONE Take 1 tablet (200 mg total) by mouth daily.   apixaban 2.5 MG Tabs tablet Commonly known as: ELIQUIS Take 1 tablet (2.5 mg total) by mouth 2 (two) times daily. What changed:  medication strength how much to take   Auryxia 1 GM 210 MG(Fe) tablet Generic drug: ferric citrate Take 420 mg by mouth 3 (three) times daily with meals.   diazepam 5 MG tablet Commonly known as: Valium 1 tab po bid prn anxiety What changed:  how much to take how to take this when to take  this reasons to take this additional instructions   docusate sodium 100 MG capsule Commonly known as: Colace Take 1 capsule (100 mg total) by mouth daily.   gabapentin 300 MG capsule Commonly known as: NEURONTIN Take 1 capsule (300 mg total) by mouth daily.   levothyroxine 137 MCG tablet Commonly known as: SYNTHROID Take 1 tablet (137 mcg total) by mouth daily before breakfast.   lidocaine-prilocaine cream Commonly known as: EMLA 1 application. 3 (three) times a week.  nitroGLYCERIN 0.4 MG SL tablet Commonly known as: NITROSTAT Place 1 tablet (0.4 mg total) under the tongue every 5 (five) minutes as needed for chest pain.   pantoprazole 40 MG tablet Commonly known as: Protonix Take 1 tablet (40 mg total) by mouth 2 (two) times daily before a meal.   traZODone 50 MG tablet Commonly known as: DESYREL Take 0.5-1 tablets (25-50 mg total) by mouth at bedtime as needed for sleep. What changed: how much to take         Follow-up Information     Saguier, [name]. Schedule an appointment as soon as possible for a visit in 1 week(s).   Specialties: Internal Medicine, Family Medicine Contact information: [address] [place] [address] [phone]         Sharyn Creamer, MD. Schedule an appointment as soon as possible for a visit in 1 month(s).   Specialty: Gastroenterology Contact information: [address] [place] [address] [phone]                 Major procedures and Radiology Reports - PLEASE review detailed and final reports thoroughly  -       CT ABDOMEN PELVIS W CONTRAST  Result Date: 10/24/2021 CLINICAL DATA:  LLQ abdominal pain--patient has been on dialysis for a year and a half- EXAM: CT ABDOMEN AND PELVIS WITH CONTRAST TECHNIQUE: Multidetector CT imaging of the abdomen and pelvis was performed using the standard protocol following bolus administration of intravenous contrast. RADIATION DOSE REDUCTION:  This exam was performed according to the departmental dose-optimization program which includes automated exposure control, adjustment of the mA and/or kV according to patient size and/or use of iterative reconstruction technique. CONTRAST:  179m OMNIPAQUE IOHEXOL 300 MG/ML  SOLN COMPARISON:  None Available. FINDINGS: Lower chest: Cardiac device leads partially visualized. No acute abnormality. Hepatobiliary: Nodular hepatic contour. The hepatic parenchyma is diffusely hypodense compared to the splenic parenchyma consistent with fatty infiltration. No focal liver abnormality. No gallstones, gallbladder wall thickening, or pericholecystic fluid. No biliary dilatation. Pancreas: No focal lesion. Normal pancreatic contour. No surrounding inflammatory changes. No main pancreatic ductal dilatation. Spleen: Normal in size without focal abnormality. Adrenals/Urinary Tract: No adrenal nodule bilaterally. Bilateral kidneys enhance symmetrically. No hydronephrosis. No hydroureter. The urinary bladder is unremarkable. Stomach/Bowel: Query partial colectomy. Stomach is within normal limits. No evidence of bowel wall thickening or dilatation. Status post appendectomy Vascular/Lymphatic: Prominent inferior vena cava with retrograde reflux of contrast from the right atrium. No abdominal aorta or iliac aneurysm. Mild to moderate atherosclerotic plaque of the aorta and its branches. No abdominal, pelvic, or inguinal lymphadenopathy. Reproductive: Several calcified lesions within the urine walls measuring to 5.2 cm. Bilateral adnexa are unremarkable. Other: No intraperitoneal free fluid. No intraperitoneal free gas. No organized fluid collection. Musculoskeletal: Likely trace free fluid containing left inguinal hernia (2:66, 5:64). No suspicious lytic or blastic osseous lesions. No acute displaced fracture. Multilevel degenerative changes of the spine. IMPRESSION: 1. Cirrhosis and hepatic steatosis with no definite findings of portal  hypertension. No focal liver lesions identified. Please note that liver protocol enhanced MR and CT are the most sensitive tests for the screening detection of hepatocellular carcinoma in the high risk setting of cirrhosis. 2. Likely trace free fluid containing left inguinal hernia 3. Uterine fibroids. 4. Prominent inferior vena cava with retrograde reflux of contrast from the right atrium. Query right heart strain. 5.  Aortic Atherosclerosis (ICD10-I70.0). Electronically Signed   By: MThomasena Edis  Mckinley Jewel M.D.   On: 10/24/2021 22:08     Today   Subjective    Latanga Nedrow today has no headache,no chest abdominal pain,no new weakness tingling or numbness, feels much better wants to go home today.     Objective   Blood pressure (!) 100/51, pulse 93, temperature 98 F (36.7 C), temperature source Oral, resp. rate 16, height '4\' 11"'$  (1.499 m), weight 56.2 kg, SpO2 96 %.   Intake/Output Summary (Last 24 hours) at 10/27/2021 0915 Last data filed at 10/26/2021 1342 Gross per 24 hour  Intake 250 ml  Output 0 ml  Net 250 ml    Exam  Awake Alert, No new F.N deficits,    [place].AT,PERRAL Supple Neck,   Symmetrical Chest wall movement, Good air movement bilaterally, CTAB RRR,No Gallops,   +ve B.Sounds, Abd Soft, Non tender,  No Cyanosis, Clubbing or edema    Data Review   Recent Labs  Lab 10/24/21 2040 10/24/21 2331 10/25/21 0410 10/25/21 1330 10/26/21 0636 10/27/21 0328  WBC 5.4  --  5.1  --  3.9* 3.9*  HGB 9.9* 8.9* 9.0* 9.7* 9.5* 8.9*  HCT 29.6* 27.3* 27.5* 29.3* 28.5* 26.8*  PLT 96*  --  94*  --  109* 124*  MCV 94.0  --  93.2  --  94.4 93.4  MCH 31.4  --  30.5  --  31.5 31.0  MCHC 33.4  --  32.7  --  33.3 33.2  RDW 16.7*  --  16.8*  --  17.0* 16.8*  LYMPHSABS 1.0  --  1.5  --   --  1.2  MONOABS 0.6  --  0.5  --   --  0.6  EOSABS 0.1  --  0.2  --   --  0.1  BASOSABS 0.0  --  0.0  --   --  0.0    Recent Labs  Lab 10/24/21 2040 10/25/21 0410 10/26/21 0636 10/27/21 0328   NA 138 137 139 135  K 3.6 3.9 3.8 3.4*  CL 94* 95* 100 94*  CO2 33* 33* 28 30  GLUCOSE 106* 75 82 84  BUN '10 11 15 9  '$ CREATININE 3.74* 4.28* 6.29* 4.71*  CALCIUM 8.2* 7.8* 8.3* 8.2*  AST 22  --   --   --   ALT 12  --   --   --   ALKPHOS 73  --   --   --   BILITOT 0.6  --   --   --   ALBUMIN 4.1 3.6 3.8 3.4*  PHOS  --  2.7 3.4 3.1    Total Time in preparing paper work, data evaluation and todays exam - 35 minutes  Lala Lund M.D on 10/27/2021 at 9:15 AM  [facility]

## 2021-10-27 NOTE — Anesthesia Postprocedure Evaluation (Signed)
Anesthesia Post Note  Patient: Armilda Vanderlinden  Procedure(s) Performed: ESOPHAGOGASTRODUODENOSCOPY (EGD) WITH PROPOFOL COLONOSCOPY WITH PROPOFOL     Patient location during evaluation: Endoscopy Anesthesia Type: MAC Level of consciousness: awake and alert Pain management: pain level controlled Vital Signs Assessment: post-procedure vital signs reviewed and stable Respiratory status: spontaneous breathing, nonlabored ventilation, respiratory function stable and patient connected to nasal cannula oxygen Cardiovascular status: stable and blood pressure returned to baseline Postop Assessment: no apparent nausea or vomiting Anesthetic complications: no   No notable events documented.  Last Vitals:  Vitals:   10/26/21 2332 10/27/21 0454  BP: (!) 142/60 (!) 102/51  Pulse: 89 91  Resp: 17 16  Temp: 36.6 C 37.1 C  SpO2: 100% 96%    Last Pain:  Vitals:   10/27/21 0454  TempSrc: Oral  PainSc:                  March Rummage Urijah Arko

## 2021-10-27 NOTE — Progress Notes (Signed)
PT Cancellation Note  Patient Details Name: Morgan Clay MRN: [id number] DOB: 07-29-1945   Cancelled Treatment:    Reason Eval/Treat Not Completed: Other (comment). Pt reports she is going home today and has been up without difficulty.   Shary Decamp [facility] 10/27/2021, 10:02 AM [place] Office [phone]

## 2021-10-28 ENCOUNTER — Telehealth: Payer: Self-pay

## 2021-10-28 LAB — H. PYLORI ANTIBODY, IGG: H Pylori IgG: 0.26 Index Value (ref 0.00–0.79)

## 2021-10-28 NOTE — Telephone Encounter (Signed)
Pt has previously been taking off the schedule for July:  Pt has been previously been scheduled with Hyacinth Meeker PA; Please advise if you would like for me to cancel appt with Hyacinth Meeker PA that is scheduled for 11/23/2021 at 2:00 PM  and schedule pt with you Dr. Leone Payor and if so how soon would you like this appointment:  Please advise

## 2021-10-29 ENCOUNTER — Telehealth: Payer: Self-pay | Admitting: Nephrology

## 2021-11-01 ENCOUNTER — Ambulatory Visit (INDEPENDENT_AMBULATORY_CARE_PROVIDER_SITE_OTHER): Payer: Medicare Other | Admitting: Cardiology

## 2021-11-01 ENCOUNTER — Ambulatory Visit (INDEPENDENT_AMBULATORY_CARE_PROVIDER_SITE_OTHER): Payer: Medicare Other

## 2021-11-01 ENCOUNTER — Encounter: Payer: Self-pay | Admitting: Cardiology

## 2021-11-01 DIAGNOSIS — I428 Other cardiomyopathies: Secondary | ICD-10-CM

## 2021-11-02 LAB — CUP PACEART REMOTE DEVICE CHECK
Battery Remaining Longevity: 4 mo
Battery Voltage: 2.83 V
Brady Statistic AP VP Percent: 0.01 %
Brady Statistic AP VS Percent: 0 %
Brady Statistic AS VP Percent: 99.93 %
Brady Statistic AS VS Percent: 0.06 %
Brady Statistic RA Percent Paced: 0.01 %
Brady Statistic RV Percent Paced: 99.92 %
Date Time Interrogation Session: 20230627001707
HighPow Impedance: 37 Ohm
HighPow Impedance: 67 Ohm
Implantable Lead Implant Date: 20080818
Implantable Lead Implant Date: 20080818
Implantable Lead Implant Date: 20080818
Implantable Lead Location: 753858
Implantable Lead Location: 753859
Implantable Lead Location: 753860
Implantable Lead Model: 4193
Implantable Lead Model: 5076
Implantable Lead Model: 6947
Implantable Pulse Generator Implant Date: 20170531
Lead Channel Impedance Value: 247 Ohm
Lead Channel Impedance Value: 304 Ohm
Lead Channel Impedance Value: 399 Ohm
Lead Channel Impedance Value: 4047 Ohm
Lead Channel Impedance Value: 4047 Ohm
Lead Channel Impedance Value: 437 Ohm
Lead Channel Pacing Threshold Amplitude: 1 V
Lead Channel Pacing Threshold Amplitude: 1.125 V
Lead Channel Pacing Threshold Amplitude: 2.125 V
Lead Channel Pacing Threshold Pulse Width: 0.4 ms
Lead Channel Pacing Threshold Pulse Width: 0.4 ms
Lead Channel Pacing Threshold Pulse Width: 0.4 ms
Lead Channel Sensing Intrinsic Amplitude: 3.875 mV
Lead Channel Sensing Intrinsic Amplitude: 3.875 mV
Lead Channel Sensing Intrinsic Amplitude: 8.25 mV
Lead Channel Sensing Intrinsic Amplitude: 8.25 mV
Lead Channel Setting Pacing Amplitude: 2.25 V
Lead Channel Setting Pacing Amplitude: 2.5 V
Lead Channel Setting Pacing Amplitude: 2.75 V
Lead Channel Setting Pacing Pulse Width: 0.4 ms
Lead Channel Setting Pacing Pulse Width: 0.4 ms
Lead Channel Setting Sensing Sensitivity: 0.3 mV

## 2021-11-10 ENCOUNTER — Telehealth: Payer: Self-pay | Admitting: *Deleted

## 2021-11-10 ENCOUNTER — Ambulatory Visit (HOSPITAL_BASED_OUTPATIENT_CLINIC_OR_DEPARTMENT_OTHER)
Admission: RE | Admit: 2021-11-10 | Discharge: 2021-11-10 | Disposition: A | Discharge status: Home / Self Care | Payer: Medicare Other | Source: Ambulatory Visit | Attending: Medical | Admitting: Medical

## 2021-11-10 ENCOUNTER — Ambulatory Visit (INDEPENDENT_AMBULATORY_CARE_PROVIDER_SITE_OTHER): Payer: Medicare Other | Admitting: Medical

## 2021-11-10 VITALS — BP 129/56 | HR 93 | Resp 18 | Ht 59.0 in | Wt 126.0 lb

## 2021-11-10 DIAGNOSIS — B1920 Unspecified viral hepatitis C without hepatic coma: Secondary | ICD-10-CM

## 2021-11-10 DIAGNOSIS — K922 Gastrointestinal hemorrhage, unspecified: Secondary | ICD-10-CM

## 2021-11-10 DIAGNOSIS — E039 Hypothyroidism, unspecified: Secondary | ICD-10-CM

## 2021-11-10 DIAGNOSIS — R0600 Dyspnea, unspecified: Secondary | ICD-10-CM

## 2021-11-10 DIAGNOSIS — I5032 Chronic diastolic (congestive) heart failure: Secondary | ICD-10-CM | POA: Insufficient documentation

## 2021-11-10 DIAGNOSIS — K7469 Other cirrhosis of liver: Secondary | ICD-10-CM

## 2021-11-10 DIAGNOSIS — F419 Anxiety disorder, unspecified: Secondary | ICD-10-CM | POA: Diagnosis not present

## 2021-11-10 DIAGNOSIS — I959 Hypotension, unspecified: Secondary | ICD-10-CM

## 2021-11-10 DIAGNOSIS — N186 End stage renal disease: Secondary | ICD-10-CM

## 2021-11-10 LAB — COMPREHENSIVE METABOLIC PANEL
ALT: 7 U/L (ref 0–35)
AST: 15 U/L (ref 0–37)
Albumin: 4.5 g/dL (ref 3.5–5.2)
Alkaline Phosphatase: 73 U/L (ref 39–117)
BUN: 12 mg/dL (ref 6–23)
CO2: 35 mEq/L — ABNORMAL HIGH (ref 19–32)
Calcium: 9 mg/dL (ref 8.4–10.5)
Chloride: 92 mEq/L — ABNORMAL LOW (ref 96–112)
Creatinine, Ser: 4.55 mg/dL (ref 0.40–1.20)
GFR: 9.05 mL/min — CL (ref >60.00)
Glucose, Bld: 75 mg/dL (ref 70–99)
Potassium: 3.8 mEq/L (ref 3.5–5.1)
Sodium: 137 mEq/L (ref 135–145)
Total Bilirubin: 0.6 mg/dL (ref 0.2–1.2)
Total Protein: 7.7 g/dL (ref 6.0–8.3)

## 2021-11-10 LAB — CBC WITH DIFFERENTIAL/PLATELET
Basophils Absolute: 0 10*3/uL (ref 0.0–0.1)
Basophils Relative: 0.7 % (ref 0.0–3.0)
Eosinophils Absolute: 0.1 10*3/uL (ref 0.0–0.7)
Eosinophils Relative: 1.8 % (ref 0.0–5.0)
HCT: 34.4 % — ABNORMAL LOW (ref 36.0–46.0)
Hemoglobin: 11 g/dL — ABNORMAL LOW (ref 12.0–15.0)
Lymphocytes Relative: 24.3 % (ref 12.0–46.0)
Lymphs Abs: 1 10*3/uL (ref 0.7–4.0)
MCHC: 32 g/dL (ref 30.0–36.0)
MCV: 93.8 fl (ref 78.0–100.0)
Monocytes Absolute: 0.5 10*3/uL (ref 0.1–1.0)
Monocytes Relative: 12 % (ref 3.0–12.0)
Neutro Abs: 2.4 10*3/uL (ref 1.4–7.7)
Neutrophils Relative %: 61.2 % (ref 43.0–77.0)
Platelets: 150 10*3/uL (ref 150.0–400.0)
RBC: 3.67 Mil/uL — ABNORMAL LOW (ref 3.87–5.11)
RDW: 17.6 % — ABNORMAL HIGH (ref 11.5–15.5)
WBC: 3.9 10*3/uL — ABNORMAL LOW (ref 4.0–10.5)

## 2021-11-10 LAB — BRAIN NATRIURETIC PEPTIDE: Pro B Natriuretic peptide (BNP): 1656 pg/mL — ABNORMAL HIGH (ref 0.0–100.0)

## 2021-11-10 MED ORDER — FUROSEMIDE 20 MG PO TABS: 20.0000 mg | ORAL_TABLET | Freq: Every day | ORAL | 0 refills | Status: DC

## 2021-11-10 MED ORDER — AMIODARONE HCL 200 MG PO TABS: 200.0000 mg | ORAL_TABLET | Freq: Every day | ORAL | 3 refills | Status: DC

## 2021-11-10 MED ORDER — DIAZEPAM 5 MG PO TABS: ORAL_TABLET | ORAL | 0 refills | Status: DC

## 2021-11-10 NOTE — Addendum Note (Signed)
Addended by: Anabel Halon on: 11/10/2021 05:42 PM   Modules accepted: Orders

## 2021-11-10 NOTE — Progress Notes (Signed)
Subjective:    Patient ID: Morgan Clay, female    DOB: [birth year], 76 y.o.   MRN: [id number]  HPI  Pt in for follow up from hospital. Below in " from DC summary.  Admit date: 10/24/2021  Discharge date: 10/27/2021   Admitted From: Home   Disposition:  Home     Recommendations for Outpatient Follow-up:    Follow up with PCP in 1-2 weeks   PCP Please obtain BMP/CBC, 2 view CXR in 1week,  (see Discharge instructions)    PCP Please follow up on the following pending results: Monitor H&H please arrange for GI follow-up within a month   Home Health: None Equipment/Devices: None  Consultations: GI, Renal Discharge Condition: Stable  CODE STATUS: Full    Diet Recommendation: Renal with 1.5 L fluid restriction per day    Brief history of present illness from the day of admission and additional interim summary      76 y.o. female with medical history significant of atrial on chronic anticoagulation, NICM s/p ICD last EF 60-65%, ESRD on HD (MWF) followed by Dr. Moshe Cipro, cirrhosis of the liver, GI bleed, hepatitis C s/p Harvoni treatment who presents for rectal bleeding                                                                 Hospital Course    Acute blood loss anemia secondary to GI bleed GI bleed previous history of AVMs. - Patient presents with 3-4 episodes of bright red blood per rectum, overall H&H is stable, Eliquis was held and she was placed on PPI she was seen by GI underwent EGD colonoscopy which showed some diverticulosis and erosive gastropathy but no signs of acute bleeding.  She likely had diverticular bleed.  Did not require any transfusions H&H stable, case discussed with GI to be discharged on twice daily PPI with outpatient PCP follow-up and GI follow-up in 4 to 6 weeks.   Atrial flutter/atrial fibrillation on chronic anticoagulation Follows with Dr. Loralie Champagne, on amiodarone which will be continued, Eliquis resumed at renal dose.   Transient  hypotension Chronic hypotension stable and symptom-free.  Continue home regimen.  Patient unsure if she was taking BiDil at home, this was listed in her home medication currently on hold as blood pressure soft.  PCP to monitor.   History of nonischemic cardiomyopathy s/p AICD At this time patient does not appear to be fluid overload.  Last echocardiogram revealed EF of 60 to 65% in 07/2021.  Fluid removal via HD.   COPD, without acute exacerbation Patient without significant wheezing on physical exam. -Breathing treatments as needed   Thrombocytopenia Acute on chronic.  Platelet count 96->94. -Continue to monitor   ESRD on HD Nephrology on board, on MWF schedule.  Was dialyzed on 10/26/2021.   Compensated cirrhosis related to hepatitis C Patient is status post treatment with Harvoni.  Outpatient follow-up with ID and GI as needed.   Anxiety -Continue Valium as needed   Hypothyroidism TSH 4.89 on 6/1   Since dc pt had fatigue, mild dyspnea, mild dizzy and abdomen after she takes meds pantaprozole.   Pt states following up with GI MD on November 22, 2021.  Pt has called cardiologist. She is trying to get follow up appt.  Last saw   Review of Systems  Constitutional:  Positive for fatigue.  Respiratory:  Positive for shortness of breath. Negative for cough, chest tightness and wheezing.   Cardiovascular:  Negative for chest pain and palpitations.  Gastrointestinal:  Positive for abdominal pain. Negative for abdominal distention, blood in stool, constipation, nausea and rectal pain.  Genitourinary:  Negative for dyspareunia, dysuria, flank pain and frequency.  Musculoskeletal:  Negative for back pain.  Skin:  Negative for rash.  Neurological:  Negative for speech difficulty, weakness and light-headedness.       Intermittent brief dizziness.   Hematological:  Negative for adenopathy. Does not bruise/bleed easily.  Psychiatric/Behavioral:  Negative for behavioral problems and decreased  concentration.    Past Medical History:  Diagnosis Date   Acute blood loss anemia 09/24/2020   AICD (automatic cardioverter/defibrillator) present 10/06/2015   Anemia due to chronic blood loss 04/15/2020   Anemia in chronic kidney disease 01/18/2018   Angiodysplasia of small intestine 06/10/2020   Ileum - seen on capsule endoscopy 05/2020 - ablated at colonoscopy   Aortic atherosclerosis ([place]) 08/30/2017   Arthritis    "qwhere" (01/03/2018)   Asthma    reports mild asthma since childhood - had COPD on dx list from prior PCP   Atrial fibrillation with RVR ([place]) 11/17/2020   Atrial fibrillation, chronic 10/25/2017   Atypical atrial flutter ([place]) 09/28/2015   AVM (arteriovenous malformation) of small bowel, acquired 01/14/2018   Biventricular ICD (implantable cardioverter-defibrillator) in place 01/08/2007   Qualifier: Diagnosis of  By: Hassell Done FNP, Nykedtra     Bleeding pseudoaneurysm of left brachiocephalic arteriovenous fistula ([place]) 09/24/2020   Bleeding pseudoaneurysm of left brachiocephalic AV fistula ([place]) 09/24/2020   Carpal tunnel syndrome, bilateral 06/09/2021   Chronic bronchitis ([place])    "get it most years; not this past year though" (01/03/2018)   Chronic diastolic CHF (congestive heart failure) ([place]) 04/24/2007   Annotation: secondary to nonischemic cardiomyopathy s/p CRT-D Followed by Dr. Caryl Comes in Cardiology Qualifier: Diagnosis of  By: Hassell Done FNP, Nykedtra     Chronic obstructive pulmonary disease ([place])    Chronic systolic congestive heart failure ([place])    CKD (chronic kidney disease) stage 4, GFR 15-29 ml/min  11/24/2013   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 (HCV) ([place])    HEPATITIS C - s/p treatment with Harvoni, saw hepatology, Dawn Drazek   COVID-19 virus infection 05/27/2020   Diabetes mellitus without complication ([place])    DIET CONTROLLED    Dialysis complication    Diverticulitis of left colon status post robotic low anterior to sigmoid resection  05/22/2018 07/31/2017   End stage renal disease ([place]) 03/22/2021   FIBROIDS, UTERUS 03/05/2008   Glaucoma    Gout    History of blood transfusion ~ 11/2017   Hypertension associated with diabetes ([place]) 06/07/2017   Hypothyroidism    LBBB (left bundle branch block)    Lesion of ulnar nerve 06/09/2021   Long term current use of anticoagulant    Lower GI bleeding    "been dealing w/it since 07/2017" (01/03/2018)   Malnutrition of moderate degree 07/09/2020   MDD (major depressive disorder), recurrent, in partial remission ([place]) 06/07/2017   Nonischemic cardiomyopathy ([place])    Obesity 05/27/2009   Qualifier: Diagnosis of  By: Hassell Done FNP, Nykedtra     OBSTRUCTIVE SLEEP APNEA 11/14/2007   no CPAP   OSTEOPENIA 09/30/2008   Other cirrhosis of liver ([place]) 08/12/2020   Formatting of this note is different from the original. Patient  with history of cirrhosis secondary to hepatitis C with no history of hepatic decompensation.  She has had some hyponatremia and hypoalbuminemia in the past however most recent labs show relatively well-preserved liver synthetic function.  She will have labs to update liver synthetic function.  Hepatoma screening - patient is currentl   Overweight (BMI 25.0-29.9) 03/02/2020   Pain in right knee 05/13/2018   Persistent atrial fibrillation ([place]) 02/06/2020   Pneumonia    "several times" (01/03/2018)   Polyneuropathy associated with underlying disease ([place]) 06/09/2021   Pulmonary hypertension ([place]) on echocardiogram 01/14/2018   Secondary esophageal varices without bleeding ([place]) 12/23/2020   Formatting of this note might be different from the original. Patient had trace esophageal varices during small bowel enteroscopy in October 2021, follow-up EGD in January 2022 with no varices.  She previously had GI bleeding thought to be from AVM versus GAVE.  She is no longer on carvedilol and will need surveillance endoscopy.  Patient was previously followed at [facility] and I have  asked her    Secondary hypercoagulable state ([place]) 02/06/2020   Type 2 diabetes, controlled, with renal manifestation ([place]) 11/24/2013     Social History   Socioeconomic History   Marital status: Divorced    Spouse name: Not on file   Number of children: 1   Years of education: 12   Highest education level: Not on file  Occupational History   Not on file  Tobacco Use   Smoking status: Never   Smokeless tobacco: Never  Vaping Use   Vaping Use: Never used  Substance and Sexual Activity   Alcohol use: Not Currently    Comment: 01/03/2018 "couple drinks/month"   Drug use: Not Currently    Types: Marijuana    Comment: VERY INTERMITTENT    Sexual activity: Not Currently  Other Topics Concern   Not on file  Social History Narrative   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      Home Situation: lives alone      Spiritual Beliefs: Christian - [employer]      Lifestyle: no regular exercise; poor diet      She is divorced at least one adult child   Never smoker    rare alcohol to occasional at best    no substance abuse         Social Determinants of Health   Financial Resource Strain: Low Risk  (07/15/2021)   Overall Financial Resource Strain (CARDIA)    Difficulty of Paying Living Expenses: Not hard at all  Food Insecurity: No Food Insecurity (07/15/2021)   Hunger Vital Sign    Worried About Running Out of Food in the Last Year: Never true    Ran Out of Food in the Last Year: Never true  Transportation Needs: No Transportation Needs (07/15/2021)   PRAPARE - Hydrologist (Medical): No    Lack of Transportation (Non-Medical): No  Physical Activity: Not on file  Stress: No Stress Concern Present (07/15/2021)   [place]    Feeling of Stress : Not at all  Social Connections: Moderately Isolated (07/15/2021)   Social Connection and Isolation Panel [NHANES]    Frequency of  Communication with Friends and Family: More than three times a week    Frequency of Social Gatherings with Friends and Family: Three times a week    Attends Religious Services: More than 4 times per year    Active  Member of Clubs or Organizations: No    Attends Archivist Meetings: Never    Marital Status: Divorced  Human resources officer Violence: Not At Risk (07/15/2021)   Humiliation, Afraid, Rape, and Kick questionnaire    Fear of Current or Ex-Partner: No    Emotionally Abused: No    Physically Abused: No    Sexually Abused: No    Past Surgical History:  Procedure Laterality Date   A/V FISTULAGRAM N/A 03/28/2021   Procedure: A/V FISTULAGRAM;  Surgeon: Waynetta Sandy, MD;  Location: [place] [facility];  Service: Cardiovascular;  Laterality: N/A;   APPENDECTOMY     AV FISTULA PLACEMENT Left 08/05/2020   Procedure: LEFT UPPER EXTREMITY ARTERIOVENOUS (AV) FISTULA CREATION;  Surgeon: Cherre Robins, MD;  Location: [place];  Service: Vascular;  Laterality: Left;   [place] Left 09/16/2020   Procedure: LEFT SECOND STAGE [place];  Surgeon: Cherre Robins, MD;  Location: [place];  Service: Vascular;  Laterality: Left;  PERIPHERAL NERVE BLOCK   CARDIAC CATHETERIZATION     CARDIOVERSION N/A 12/14/2014   Procedure: CARDIOVERSION;  Surgeon: Dorothy Spark, MD;  Location: [place];  Service: Cardiovascular;  Laterality: N/A;   CARDIOVERSION N/A 02/26/2017   Procedure: CARDIOVERSION;  Surgeon: Evans Lance, MD;  Location: [place] [facility];  Service: Cardiovascular;  Laterality: N/A;   CARDIOVERSION N/A 07/24/2017   Procedure: CARDIOVERSION;  Surgeon: Acie Fredrickson Wonda Cheng, MD;  Location: [facility] ENDOSCOPY;  Service: Cardiovascular;  Laterality: N/A;   CARDIOVERSION N/A 02/12/2020   Procedure: CARDIOVERSION;  Surgeon: [name], MD;  Location: [facility] ENDOSCOPY;  Service: Cardiovascular;  Laterality: N/A;   CARDIOVERSION N/A 09/15/2021    Procedure: CARDIOVERSION;  Surgeon: [name], MD;  Location: [facility] ENDOSCOPY;  Service: Cardiovascular;  Laterality: N/A;   CARPAL TUNNEL RELEASE  07/21/2021   COLONOSCOPY N/A 11/08/2017   Procedure: COLONOSCOPY;  Surgeon: Milus Banister, MD;  Location: [facility] ENDOSCOPY;  Service: Endoscopy;  Laterality: N/A;   COLONOSCOPY WITH PROPOFOL N/A 05/31/2020   Procedure: COLONOSCOPY WITH PROPOFOL;  Surgeon: Irene Shipper, MD;  Location: [facility] ENDOSCOPY;  Service: Endoscopy;  Laterality: N/A;   COLONOSCOPY WITH PROPOFOL N/A 10/26/2021   Procedure: COLONOSCOPY WITH PROPOFOL;  Surgeon: Sharyn Creamer, MD;  Location: [place];  Service: Gastroenterology;  Laterality: N/A;   DILATION AND CURETTAGE OF UTERUS     ENTEROSCOPY N/A 03/02/2020   Procedure: ENTEROSCOPY;  Surgeon: Yetta Flock, MD;  Location: [facility] ENDOSCOPY;  Service: Gastroenterology;  Laterality: N/A;   EP IMPLANTABLE DEVICE N/A 10/06/2015   Procedure: BIV ICD Generator Changeout;  Surgeon: [name], MD;  Location: [place] [facility];  Service: Cardiovascular;  Laterality: N/A;   ESOPHAGOGASTRODUODENOSCOPY N/A 10/26/2017   Procedure: ESOPHAGOGASTRODUODENOSCOPY (EGD);  Surgeon: Ladene Artist, MD;  Location: [facility];  Service: Endoscopy;  Laterality: N/A;   ESOPHAGOGASTRODUODENOSCOPY (EGD) WITH PROPOFOL N/A 11/07/2017   Procedure: ESOPHAGOGASTRODUODENOSCOPY (EGD) WITH PROPOFOL;  Surgeon: Milus Banister, MD;  [facility];  Service: Endoscopy;  Laterality: N/A;   ESOPHAGOGASTRODUODENOSCOPY (EGD) WITH PROPOFOL N/A 05/29/2020   Procedure: ESOPHAGOGASTRODUODENOSCOPY (EGD) WITH PROPOFOL;  Surgeon: Doran Stabler, MD;  Location: [place];  Service: Gastroenterology;  Laterality: N/A;   ESOPHAGOGASTRODUODENOSCOPY (EGD) WITH PROPOFOL N/A 10/26/2021   Procedure: ESOPHAGOGASTRODUODENOSCOPY (EGD) WITH PROPOFOL;  Surgeon: Sharyn Creamer, MD;  Location: [place];  Service: Gastroenterology;  Laterality: N/A;    GIVENS CAPSULE STUDY N/A 05/19/2020   Procedure: GIVENS CAPSULE  STUDY;  Surgeon: [name], MD;  Location: [facility] ENDOSCOPY;  Service: Endoscopy;  Laterality: N/A;  .adm for obs since pacemaker, PA wil enter order and see pt   GIVENS CAPSULE STUDY N/A 05/29/2020   Procedure: GIVENS CAPSULE STUDY;  Surgeon: Doran Stabler, MD;  Location: [place];  Service: Gastroenterology;  Laterality: N/A;   HEMATOMA EVACUATION Left 09/24/2020   Procedure: EVACUATION HEMATOMA ARM;  Surgeon: Rosetta Posner, MD;  Location[facility];  Service: Vascular;  Laterality: Left;   HOT HEMOSTASIS N/A 10/26/2017   Procedure: HOT HEMOSTASIS (ARGON PLASMA COAGULATION/BICAP);  Surgeon: Ladene Artist, MD;  Location: [facility];  Service: Endoscopy;  Laterality: N/A;   HOT HEMOSTASIS N/A 03/02/2020   Procedure: HOT HEMOSTASIS (ARGON PLASMA COAGULATION/BICAP);  Surgeon: Yetta Flock, MD;  Location: [facility] ENDOSCOPY;  Service: Gastroenterology;  Laterality: N/A;   HOT HEMOSTASIS N/A 05/31/2020   Procedure: HOT HEMOSTASIS (ARGON PLASMA COAGULATION/BICAP);  Surgeon: Irene Shipper, MD;  Location: [facility] ENDOSCOPY;  Service: Endoscopy;  Laterality: N/A;   INSERT / REPLACE / REMOVE PACEMAKER  ?2008   IR PERC TUN PERIT CATH WO PORT S&I /IMAG  07/05/2020   PERIPHERAL VASCULAR INTERVENTION Left 03/28/2021   Procedure: PERIPHERAL VASCULAR INTERVENTION;  Surgeon: Waynetta Sandy, MD;  Location: [place] [facility];  Service: Cardiovascular;  Laterality: Left;   POLYPECTOMY  11/08/2017   Procedure: POLYPECTOMY;  Surgeon: Milus Banister, MD;  Location: [facility]OS[facility];  Service: Endoscopy;;   PROCTOSCOPY N/A 05/22/2018   Procedure: RIGID PROCTOSCOPY;  Surgeon: Michael Boston, MD;  Location: [facility];  Service: General;  Laterality: N/A;   RIGHT HEART CATH N/A 02/13/2018   Procedure: RIGHT HEART CATH;  Surgeon: [name], MD;  Location: [place] [facility];  Service: Cardiovascular;  Laterality: N/A;   RIGHT HEART CATH  N/A 06/23/2020   Procedure: RIGHT HEART CATH;  Surgeon: [name], MD;  Location: [place] [facility];  Service: Cardiovascular;  Laterality: N/A;   RIGHT/LEFT HEART CATH AND CORONARY ANGIOGRAPHY N/A 12/11/2019   Procedure: RIGHT/LEFT HEART CATH AND CORONARY ANGIOGRAPHY;  Surgeon: [name], MD;  Location: [place] [facility];  Service: Cardiovascular;  Laterality: N/A;   SUBMUCOSAL TATTOO INJECTION  03/02/2020   Procedure: SUBMUCOSAL TATTOO INJECTION;  Surgeon: Yetta Flock, MD;  Location: [facility] ENDOSCOPY;  Service: Gastroenterology;;   SUBMUCOSAL TATTOO INJECTION  05/31/2020   Procedure: SUBMUCOSAL TATTOO INJECTION;  Surgeon: Irene Shipper, MD;  Location: [place];  Service: Endoscopy;;   TEE WITHOUT CARDIOVERSION N/A 08/02/2021   Procedure: TRANSESOPHAGEAL ECHOCARDIOGRAM (TEE);  Surgeon: [name], MD;  Location: [facility] ENDOSCOPY;  Service: Cardiovascular;  Laterality: N/A;   TUBAL LIGATION     XI ROBOTIC ASSISTED LOWER ANTERIOR RESECTION N/A 05/22/2018   Procedure: XI ROBOTIC ASSISTED SIGMOID COLOECTOMY MOBILIZATION OF SPLENIC FLEXURE, FIREFLY ASSESSMENT OF PERFUSION;  Surgeon: Michael Boston, MD;  Location: [facility];  Service: General;  Laterality: N/A;  ERAS PATHWAY    Family History  Problem Relation Age of Onset   Asthma Father    Heart attack Father    Asthma Sister    Lung cancer Sister    Heart attack Mother    Stroke Brother    Colon cancer Neg Hx    Esophageal cancer Neg Hx    Pancreatic cancer Neg Hx    Stomach cancer Neg Hx    Liver disease Neg Hx     No Known Allergies  Current Outpatient Medications on  File Prior to Visit  Medication Sig Dispense Refill   apixaban (ELIQUIS) 2.5 MG TABS tablet Take 1 tablet (2.5 mg total) by mouth 2 (two) times daily. 60 tablet 0   docusate sodium (COLACE) 100 MG capsule Take 1 capsule (100 mg total) by mouth daily. 30 capsule 0   gabapentin (NEURONTIN) 300 MG capsule Take 1 capsule (300 mg total) by mouth  daily. 180 capsule 2   levothyroxine (SYNTHROID) 137 MCG tablet Take 1 tablet (137 mcg total) by mouth daily before breakfast. 90 tablet 3   lidocaine-prilocaine (EMLA) cream 1 application. 3 (three) times a week.     nitroGLYCERIN (NITROSTAT) 0.4 MG SL tablet Place 1 tablet (0.4 mg total) under the tongue every 5 (five) minutes as needed for chest pain. 25 tablet 0   pantoprazole (PROTONIX) 40 MG tablet Take 1 tablet (40 mg total) by mouth 2 (two) times daily before a meal. 30 tablet 2   traZODone (DESYREL) 50 MG tablet Take 0.5-1 tablets (25-50 mg total) by mouth at bedtime as needed for sleep. (Patient taking differently: Take 50 mg by mouth at bedtime as needed for sleep.) 30 tablet 3   No current facility-administered medications on file prior to visit.    BP (!) 129/56   Pulse 93   Resp 18   Ht '4\' 11"'$  (1.499 m)   Wt 126 lb (57.2 kg)   SpO2 100%   BMI 25.45 kg/m        Objective:   Physical Exam  General Mental Status- Alert. General Appearance- Not in acute distress.   Skin General: Color- Normal Color. Moisture- Normal Moisture.  Neck Carotid Arteries- Normal color. Moisture- Normal Moisture. No carotid bruits. No JVD.  Chest and Lung Exam Auscultation: Breath Sounds:-Normal.  Cardiovascular Auscultation:Rythm- Regular. Murmurs & Other Heart Sounds:Auscultation of the heart reveals- No Murmurs.  Abdomen Inspection:-Inspeection Normal. Palpation/Percussion:Note:No mass. Palpation and Percussion of the abdomen reveal- Non Tender, Non Distended + BS, no rebound or guarding.   Neurologic Cranial Nerve exam:- CN III-XII intact(No nystagmus), symmetric smile. Strength:- 5/5 equal and symmetric strength both upper and lower extremities.   Lower ext- symmetric calf no swelling and negative homans signs.    Assessment & Plan:   Patient Instructions  1.  Chronic systolic heart failure due to nonischemic cardiomyopathy: Currently on optimal medical therapy per  primary cardiology.  Status post Medtronic CRT-D.  Device functioning appropriately.  No changes.   2.  Atrial fibrillation/flutter/atrial tachycardia: Currently on amiodarone 200 mg daily.  refilling today. Follow cardiologist advise on most recent follow up visit.[name]  3 Some dysnpea since leaving hospital with history of chf. Will get cmp, bnp and cxr as follow up from hospital per dc summary.  4. History of nonischemic cardiomyopathy s/p AICD At this time patient does not appear to be fluid overload.  Last echocardiogram revealed EF of 60 to 65% in 07/2021.  Fluid removal via HD.  5. Follow up on acute blood loss anemia secondary to GI bleed GI bleed previous history of AVMs. Will get cbc today. Continue protonix.  6.Transient hypotension Chronic hypotension stable and symptom-free.  Continue home regimen.  Patient unsure if she was taking BiDil at home, this was listed in her home medication currently on hold as blood pressure soft.  PCP to monitor. Do agree to stay off your blood pressure mediations as bp good/lower end reading today.   7.Thrombocytopenia Acute on chronic.  Platelet count 96->94. -Continue to monitor   8. ESRD on  HD Nephrology on board, on MWF schedule.  Was dialyzed on 10/26/2021.   9. Compensated cirrhosis related to hepatitis C Patient is status post treatment with Harvoni.  Outpatient follow-up with ID and GI as needed.   10. Anxiety -Continue Valium as needed(refilling today)   11. Hypothyroidism TSH 4.89 on 6/1   Follow up 10-14 days or sooner if needed.         Mackie Pai, PA-C   Mackie Pai, PA-C

## 2021-11-10 NOTE — Telephone Encounter (Signed)
Routing to Doc of the Day in PCP's absence  CRITICAL VALUE STICKER  CRITICAL VALUE:  creatinine - 4.55, GFR -- 9.05  RECEIVER (on-site recipient of call): Kelle Darting, [place] NOTIFIED: 11/10/21 @ 4:20pm  MESSENGER (representative from lab): [place]  MD NOTIFIED: Mackie Pai, PA  TIME OF NOTIFICATION:  RESPONSE:

## 2021-11-10 NOTE — Patient Instructions (Addendum)
1.  Chronic systolic heart failure due to nonischemic cardiomyopathy: Currently on optimal medical therapy per primary cardiology.  Status post Medtronic CRT-D.  Device functioning appropriately.  No changes.   2.  Atrial fibrillation/flutter/atrial tachycardia: Currently on amiodarone 200 mg daily.  refilling today. Follow cardiologist advise on most recent follow up visit.[name]  3 Some dysnpea since leaving hospital with history of chf. Will get cmp, bnp and cxr as follow up from hospital per dc summary.  4. History of nonischemic cardiomyopathy s/p AICD At this time patient does not appear to be fluid overload.  Last echocardiogram revealed EF of 60 to 65% in 07/2021.  Fluid removal via HD.  5. Follow up on acute blood loss anemia secondary to GI bleed GI bleed previous history of AVMs. Will get cbc today. Continue protonix.  6.Transient hypotension Chronic hypotension stable and symptom-free.  Continue home regimen.  Patient unsure if she was taking BiDil at home, this was listed in her home medication currently on hold as blood pressure soft.  PCP to monitor. Do agree to stay off your blood pressure mediations as bp good/lower end reading today.   7.Thrombocytopenia Acute on chronic.  Platelet count 96->94. -Continue to monitor   8. ESRD on HD Nephrology on board, on MWF schedule.  Was dialyzed on 10/26/2021.   9. Compensated cirrhosis related to hepatitis C Patient is status post treatment with Harvoni.  Outpatient follow-up with ID and GI as needed.   10. Anxiety -Continue Valium as needed(refilling today)   11. Hypothyroidism TSH 4.89 on 6/1   Follow up 10-14 days or sooner if needed.

## 2021-11-11 ENCOUNTER — Other Ambulatory Visit (HOSPITAL_COMMUNITY): Payer: Self-pay

## 2021-11-11 ENCOUNTER — Telehealth (HOSPITAL_COMMUNITY): Payer: Self-pay

## 2021-11-11 NOTE — Telephone Encounter (Signed)
Pharmacy Transitions of Care Follow-up Telephone Call  Date of discharge: 10/27/21  Discharge Diagnosis: acute lower GI bleed  How have you been since you were released from the hospital? She has not noticed any symptoms concerning for bleeding or bruising. She did vocalize a bit of heartburn symptoms that have not gone away. Discussed non-pharmacological management of symptoms with the patient (not eating too late at night, avoiding greasy or spicy foods, etc). Told the patient if symptoms worsen or if she notices any signs of bleeding to return to the ED.  Pt vocalized some concerns with her previous GI bleed and use of anticoagulation. Discussed the importance of adherence to Eliquis medication for stroke prevention.  Medication changes made at discharge: START taking these medications  START taking these medications  docusate sodium 100 MG capsule Commonly known as: Colace Take 1 capsule (100 mg total) by mouth daily.  nitroGLYCERIN 0.4 MG SL tablet Commonly known as: NITROSTAT Place 1 tablet (0.4 mg total) under the tongue every 5 (five) minutes as needed for chest pain.  pantoprazole 40 MG tablet Commonly known as: Protonix Take 1 tablet (40 mg total) by mouth 2 (two) times daily before a meal.   CHANGE how you take these medications  CHANGE how you take these medications  Eliquis 2.5 MG Tabs tablet Generic drug: apixaban Take 1 tablet (2.5 mg total) by mouth 2 (two) times daily. What changed:  medication strength how much to take  traZODone 50 MG tablet Commonly known as: DESYREL Take 0.5-1 tablets (25-50 mg total) by mouth at bedtime as needed for sleep. What changed: how much to take   CONTINUE taking these medications  CONTINUE taking these medications  gabapentin 300 MG capsule Commonly known as: NEURONTIN Take 1 capsule (300 mg total) by mouth daily.  levothyroxine 137 MCG tablet Commonly known as: SYNTHROID Take 1 tablet (137 mcg total) by mouth daily before  breakfast.  lidocaine-prilocaine cream Commonly known as: EMLA   STOP taking these medications  STOP taking these medications  isosorbide-hydrALAZINE 20-37.5 MG tablet Commonly known as: BIDIL    Medication changes verified by the patient? Yes    Medication Accessibility:  Home Pharmacy: [place] [facility]   Was the patient provided with refills on discharged medications? Some, not all   Have all prescriptions been transferred from [facility] to home pharmacy? no    Medication Review: APIXABAN (ELIQUIS)  Apixaban 2.5 mg BID dose reduced on 10/27/21.  Discussed importance of taking medication around the same time everyday  - Reviewed potential DDIs with patient  - Advised patient of medications to avoid (NSAIDs, ASA)  - Educated that Tylenol (acetaminophen) will be the preferred analgesic to prevent risk of bleeding  - Emphasized importance of monitoring for signs and symptoms of bleeding (abnormal bruising, prolonged bleeding, nose bleeds, bleeding from gums, discolored urine, black tarry stools)  - Advised patient to alert all providers of anticoagulation therapy prior to starting a new medication or having a procedure   Follow-up Appointments:   Date Visit Type Length Department   12/02/2021 12:55 PM [place] CK 5 min [facility] [[id number]]  Patient Instructions:            12/06/2021 11:00 AM EST CHF 20 20 min [place] [[id number]]  Patient Instructions:            12/08/2021 10:30 AM FOLLOW UP 20 30 min [facility] [[id number]]  Patient Instructions:  Final Patient Assessment: How have you been since you were released from the hospital? She has not noticed any symptoms concerning for bleeding or bruising. She did vocalize a bit of heartburn symptoms that have not gone away. Discussed non-pharmacological management of symptoms with the patient (not eating too late at night, avoiding  greasy or spicy foods, etc). Told the patient if symptoms worsen or if she notices any signs of bleeding to return to the ED.  Pt vocalized some concerns with her previous GI bleed and use of anticoagulation. Discussed the importance of adherence to Eliquis medication for stroke prevention.

## 2021-11-14 ENCOUNTER — Other Ambulatory Visit (HOSPITAL_COMMUNITY): Payer: Self-pay | Admitting: *Deleted

## 2021-11-15 ENCOUNTER — Telehealth (HOSPITAL_COMMUNITY): Payer: Self-pay | Admitting: *Deleted

## 2021-11-15 NOTE — Telephone Encounter (Signed)
Spoke with pt she co fatigue and shortness of breath but said her weight is stable and she does not have any swelling. Pt said symptoms started last week. No other complaints at this time.   Routed to [employer] for advice

## 2021-11-16 ENCOUNTER — Ambulatory Visit: Payer: Medicare Other | Admitting: Medical

## 2021-11-16 ENCOUNTER — Ambulatory Visit (INDEPENDENT_AMBULATORY_CARE_PROVIDER_SITE_OTHER): Payer: Medicare Other | Admitting: Medical

## 2021-11-16 ENCOUNTER — Encounter: Payer: Self-pay | Admitting: Medical

## 2021-11-16 VITALS — BP 130/65 | HR 90 | Resp 18 | Ht 59.0 in | Wt 124.0 lb

## 2021-11-16 DIAGNOSIS — I5032 Chronic diastolic (congestive) heart failure: Secondary | ICD-10-CM

## 2021-11-16 DIAGNOSIS — D649 Anemia, unspecified: Secondary | ICD-10-CM

## 2021-11-16 DIAGNOSIS — N186 End stage renal disease: Secondary | ICD-10-CM

## 2021-11-16 DIAGNOSIS — F419 Anxiety disorder, unspecified: Secondary | ICD-10-CM | POA: Diagnosis not present

## 2021-11-16 DIAGNOSIS — I959 Hypotension, unspecified: Secondary | ICD-10-CM | POA: Diagnosis not present

## 2021-11-16 DIAGNOSIS — G4700 Insomnia, unspecified: Secondary | ICD-10-CM

## 2021-11-16 NOTE — Patient Instructions (Addendum)
CHF with recent dyspnea on exertion over the weekend but clinically stable.  On review he weighs actually 2 pounds less than it was on the last visit.  Last chest x-ray showed mild CHF but your BNP/heart failure protein level was elevated more than usual.  I want you to get chest x-ray stat today.  Go ahead and fill the 3 tab Lasix prescription sent in last week.  Hold off on starting until we get x-ray results and lab results back.  We will reschedule with our lab for CMP and BNP tomorrow at 11 AM.  Recent anemia improved posthospitalization.  On dialysis with good blood pressure level today but lower blood pressure levels on dialysis days.  Anxiety insomnia improved with Valium.  Follow-up date to be determined after chest x-ray and lab review.  Counseled on CHF and patients undergoing dialysis.  Symptoms of cardiorenal syndrome can occur.  So significant findings on lab and x-ray results will try to reach out to cardiologist and also try to make nephrologist aware.

## 2021-11-16 NOTE — Progress Notes (Signed)
Subjective:    Patient ID: Morgan Clay, female    DOB: [birth year], 76 y.o.   MRN: [id number]  HPI  Pt in for follow up from last visit.  Pt anxiety is controlled/getting better.  Last labs from follow up showed improved anemia.  Other labs looks overall good but bnp was very elevated. She also is on dialysis. She had shortness of breath this past weekend.  Bnp was elevated at 1656. Cxr showed below. IMPRESSION: Mild congestive heart failure.  I had sent I lasix 20 mg 3 tabs to use pending follow up if she got sob. She states was not told of this. MA did talk with pt to review results?  124 lb today. Last visit was 126 lb.      Review of Systems  Constitutional:  Negative for chills, fatigue and fever.  Respiratory:  Negative for cough, chest tightness, shortness of breath and wheezing.        Sob this weekend.  Cardiovascular:  Negative for chest pain and palpitations.  Gastrointestinal:  Negative for abdominal pain, diarrhea and nausea.  Genitourinary:  Negative for dyspareunia.  Musculoskeletal:  Negative for back pain.  Skin:  Negative for rash.  Neurological:  Negative for dizziness, numbness and headaches.  Hematological:  Negative for adenopathy. Does not bruise/bleed easily.  Psychiatric/Behavioral:  Negative for decreased concentration and hallucinations.     Past Medical History:  Diagnosis Date   Acute blood loss anemia 09/24/2020   AICD (automatic cardioverter/defibrillator) present 10/06/2015   Anemia due to chronic blood loss 04/15/2020   Anemia in chronic kidney disease 01/18/2018   Angiodysplasia of small intestine 06/10/2020   Ileum - seen on capsule endoscopy 05/2020 - ablated at colonoscopy   Aortic atherosclerosis ([place]) 08/30/2017   Arthritis    "qwhere" (01/03/2018)   Asthma    reports mild asthma since childhood - had COPD on dx list from prior PCP   Atrial fibrillation with RVR ([place]) 11/17/2020   Atrial fibrillation, chronic 10/25/2017    Atypical atrial flutter ([place]) 09/28/2015   AVM (arteriovenous malformation) of small bowel, acquired 01/14/2018   Biventricular ICD (implantable cardioverter-defibrillator) in place 01/08/2007   Qualifier: Diagnosis of  By: Hassell Done FNP, Nykedtra     Bleeding pseudoaneurysm of left brachiocephalic arteriovenous fistula ([place]) 09/24/2020   Bleeding pseudoaneurysm of left brachiocephalic AV fistula ([place]) 09/24/2020   Carpal tunnel syndrome, bilateral 06/09/2021   Chronic bronchitis ([place])    "get it most years; not this past year though" (01/03/2018)   Chronic diastolic CHF (congestive heart failure) ([place]) 04/24/2007   Annotation: secondary to nonischemic cardiomyopathy s/p CRT-D Followed by Dr. 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 (HCV) ([place])    HEPATITIS C - s/p treatment with Harvoni, saw hepatology, Dawn Drazek   COVID-19 virus infection 05/27/2020   Diabetes mellitus without complication [facility])    DIET CONTROLLED    Dialysis complication    Diverticulitis of left colon status post robotic low anterior to sigmoid resection 05/22/2018 07/31/2017   End stage renal disease ([place]) 03/22/2021   FIBROIDS, UTERUS 03/05/2008   Glaucoma    Gout    History of blood transfusion ~ 11/2017   Hypertension associated with diabetes ([place]) 06/07/2017   Hypothyroidism  LBBB (left bundle branch block)    Lesion of ulnar nerve 06/09/2021   Long term current use of anticoagulant    Lower GI bleeding    "been dealing w/it since 07/2017" (01/03/2018)   Malnutrition of moderate degree 07/09/2020   MDD (major depressive disorder), recurrent, in partial remission ([place]) 06/07/2017   Nonischemic cardiomyopathy ([place])    Obesity 05/27/2009   Qualifier: Diagnosis  of  By: Hassell Done FNP, Nykedtra     OBSTRUCTIVE SLEEP APNEA 11/14/2007   no CPAP   OSTEOPENIA 09/30/2008   Other cirrhosis of liver ([place]) 08/12/2020   Formatting of this note is different from the original. Patient with history of cirrhosis secondary to hepatitis C with no history of hepatic decompensation.  She has had some hyponatremia and hypoalbuminemia in the past however most recent labs show relatively well-preserved liver synthetic function.  She will have labs to update liver synthetic function.  Hepatoma screening - patient is currentl   Overweight (BMI 25.0-29.9) 03/02/2020   Pain in right knee 05/13/2018   Persistent atrial fibrillation ([place]) 02/06/2020   Pneumonia    "several times" (01/03/2018)   Polyneuropathy associated with underlying disease ([place]) 06/09/2021   Pulmonary hypertension ([place]) on echocardiogram 01/14/2018   Secondary esophageal varices without bleeding ([place]) 12/23/2020   Formatting of this note might be different from the original. Patient had trace esophageal varices during small bowel enteroscopy in October 2021, follow-up EGD in January 2022 with no varices.  She previously had GI bleeding thought to be from AVM versus GAVE.  She is no longer on carvedilol and will need surveillance endoscopy.  Patient was previously followed at [facility] and I have asked her    Secondary hypercoagulable state ([place]) 02/06/2020   Type 2 diabetes, controlled, with renal manifestation ([place]) 11/24/2013     Social History   Socioeconomic History   Marital status: Divorced    Spouse name: Not on file   Number of children: 1   Years of education: 12   Highest education level: Not on file  Occupational History   Not on file  Tobacco Use   Smoking status: Never   Smokeless tobacco: Never  Vaping Use   Vaping Use: Never used  Substance and Sexual Activity   Alcohol use: Not Currently    Comment: 01/03/2018 "couple drinks/month"   Drug use: Not Currently    Types: Marijuana     Comment: VERY INTERMITTENT    Sexual activity: Not Currently  Other Topics Concern   Not on file  Social History Narrative   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      Home Situation: lives alone      Spiritual Beliefs: Christian - [employer]      Lifestyle: no regular exercise; poor diet      She is divorced at least one adult child   Never smoker    rare alcohol to occasional at best    no substance abuse         Social Determinants of Health   Financial Resource Strain: Low Risk  (07/15/2021)   Overall Financial Resource Strain (CARDIA)    Difficulty of Paying Living Expenses: Not hard at all  Food Insecurity: No Food Insecurity (07/15/2021)   Hunger Vital Sign    Worried About Running Out of Food in the Last Year: Never true    Ran Out of Food in the Last Year: Never true  Transportation Needs: No Transportation Needs (07/15/2021)   [place] - Transportation  Lack of Transportation (Medical): No    Lack of Transportation (Non-Medical): No  Physical Activity: Not on file  Stress: No Stress Concern Present (07/15/2021)   [place]    Feeling of Stress : Not at all  Social Connections: Moderately Isolated (07/15/2021)   Social Connection and Isolation Panel [NHANES]    Frequency of Communication with Friends and Family: More than three times a week    Frequency of Social Gatherings with Friends and Family: Three times a week    Attends Religious Services: More than 4 times per year    Active Member of Clubs or Organizations: No    Attends Archivist Meetings: Never    Marital Status: Divorced  Human resources officer Violence: Not At Risk (07/15/2021)   Humiliation, Afraid, Rape, and Kick questionnaire    Fear of Current or Ex-Partner: No    Emotionally Abused: No    Physically Abused: No    Sexually Abused: No    Past Surgical History:  Procedure Laterality Date   A/V FISTULAGRAM N/A 03/28/2021    Procedure: A/V FISTULAGRAM;  Surgeon: Waynetta Sandy, MD;  Location: [place] [facility];  Service: Cardiovascular;  Laterality: N/A;   APPENDECTOMY     AV FISTULA PLACEMENT Left 08/05/2020   Procedure: LEFT UPPER EXTREMITY ARTERIOVENOUS (AV) FISTULA CREATION;  Surgeon: Cherre Robins, MD;  Location: [place];  Service: Vascular;  Laterality: Left;   [place] Left 09/16/2020   Procedure: LEFT SECOND STAGE [place];  Surgeon: Cherre Robins, MD;  Location: [place];  Service: Vascular;  Laterality: Left;  PERIPHERAL NERVE BLOCK   CARDIAC CATHETERIZATION     CARDIOVERSION N/A 12/14/2014   Procedure: CARDIOVERSION;  Surgeon: Dorothy Spark, MD;  Location: [place];  Service: Cardiovascular;  Laterality: N/A;   CARDIOVERSION N/A 02/26/2017   Procedure: CARDIOVERSION;  Surgeon: Evans Lance, MD;  Location: [place] [facility];  Service: Cardiovascular;  Laterality: N/A;   CARDIOVERSION N/A 07/24/2017   Procedure: CARDIOVERSION;  Surgeon: Acie Fredrickson Wonda Cheng, MD;  Location: [facility] ENDOSCOPY;  Service: Cardiovascular;  Laterality: N/A;   CARDIOVERSION N/A 02/12/2020   Procedure: CARDIOVERSION;  Surgeon: [name], MD;  Location: [facility] ENDOSCOPY;  Service: Cardiovascular;  Laterality: N/A;   CARDIOVERSION N/A 09/15/2021   Procedure: CARDIOVERSION;  Surgeon: [name], MD;  Location: [facility] ENDOSCOPY;  Service: Cardiovascular;  Laterality: N/A;   CARPAL TUNNEL RELEASE  07/21/2021   COLONOSCOPY N/A 11/08/2017   Procedure: COLONOSCOPY;  Surgeon: Milus Banister, MD;  Location: [facility] ENDOSCOPY;  Service: Endoscopy;  Laterality: N/A;   COLONOSCOPY WITH PROPOFOL N/A 05/31/2020   Procedure: COLONOSCOPY WITH PROPOFOL;  Surgeon: Irene Shipper, MD;  Location: [facility] ENDOSCOPY;  Service: Endoscopy;  Laterality: N/A;   COLONOSCOPY WITH PROPOFOL N/A 10/26/2021   Procedure: COLONOSCOPY WITH PROPOFOL;  Surgeon: Sharyn Creamer, MD;  Location: [place];  Service:  Gastroenterology;  Laterality: N/A;   DILATION AND CURETTAGE OF UTERUS     ENTEROSCOPY N/A 03/02/2020   Procedure: ENTEROSCOPY;  Surgeon: Yetta Flock, MD;  Location: [facility] ENDOSCOPY;  Service: Gastroenterology;  Laterality: N/A;   EP IMPLANTABLE DEVICE N/A 10/06/2015   Procedure: BIV ICD Generator Changeout;  Surgeon: [name], MD;  Location: [place] [facility];  Service: Cardiovascular;  Laterality: N/A;   ESOPHAGOGASTRODUODENOSCOPY N/A 10/26/2017   Procedure: ESOPHAGOGASTRODUODENOSCOPY (EGD);  Surgeon: Ladene Artist, MD;  [facility];  Service: Endoscopy;  Laterality: N/A;   ESOPHAGOGASTRODUODENOSCOPY (EGD) WITH PROPOFOL N/A 11/07/2017   Procedure: ESOPHAGOGASTRODUODENOSCOPY (EGD) WITH PROPOFOL;  Surgeon: Milus Banister, MD;  [facility];  Service: Endoscopy;  Laterality: N/A;   ESOPHAGOGASTRODUODENOSCOPY (EGD) WITH PROPOFOL N/A 05/29/2020   Procedure: ESOPHAGOGASTRODUODENOSCOPY (EGD) WITH PROPOFOL;  Surgeon: Doran Stabler, MD;  Location: [place];  Service: Gastroenterology;  Laterality: N/A;   ESOPHAGOGASTRODUODENOSCOPY (EGD) WITH PROPOFOL N/A 10/26/2021   Procedure: ESOPHAGOGASTRODUODENOSCOPY (EGD) WITH PROPOFOL;  Surgeon: Sharyn Creamer, MD;  Location: [place];  Service: Gastroenterology;  Laterality: N/A;   GIVENS CAPSULE STUDY N/A 05/19/2020   Procedure: GIVENS CAPSULE STUDY;  Surgeon: [name], MD;  Location: [place];  Service: Endoscopy;  Laterality: N/A;  .adm for obs since pacemaker, PA wil enter order and see pt   GIVENS CAPSULE STUDY N/A 05/29/2020   Procedure: GIVENS CAPSULE STUDY;  Surgeon: Doran Stabler, MD;  Location: [place];  Service: Gastroenterology;  Laterality: N/A;   HEMATOMA EVACUATION Left 09/24/2020   Procedure: EVACUATION HEMATOMA ARM;  Surgeon: Rosetta Posner, MD;  [facility] OR;  Service: Vascular;  Laterality: Left;   HOT HEMOSTASIS N/A 10/26/2017   Procedure: HOT HEMOSTASIS (ARGON PLASMA  COAGULATION/BICAP);  Surgeon: Ladene Artist, MD;  Location: [facility];  Service: Endoscopy;  Laterality: N/A;   HOT HEMOSTASIS N/A 03/02/2020   Procedure: HOT HEMOSTASIS (ARGON PLASMA COAGULATION/BICAP);  Surgeon: Yetta Flock, MD;  Location: [facility] ENDOSCOPY;  Service: Gastroenterology;  Laterality: N/A;   HOT HEMOSTASIS N/A 05/31/2020   Procedure: HOT HEMOSTASIS (ARGON PLASMA COAGULATION/BICAP);  Surgeon: Irene Shipper, MD;  Location: [facility] ENDOSCOPY;  Service: Endoscopy;  Laterality: N/A;   INSERT / REPLACE / REMOVE PACEMAKER  ?2008   IR PERC TUN PERIT CATH WO PORT S&I /IMAG  07/05/2020   PERIPHERAL VASCULAR INTERVENTION Left 03/28/2021   Procedure: PERIPHERAL VASCULAR INTERVENTION;  Surgeon: Waynetta Sandy, MD;  Location: [place] [facility];  Service: Cardiovascular;  Laterality: Left;   POLYPECTOMY  11/08/2017   Procedure: POLYPECTOMY;  Surgeon: Milus Banister, MD;  [facility]OS[facility];  Service: Endoscopy;;   PROCTOSCOPY N/A 05/22/2018   Procedure: RIGID PROCTOSCOPY;  Surgeon: Michael Boston, MD;  [facility];  Service: General;  Laterality: N/A;   RIGHT HEART CATH N/A 02/13/2018   Procedure: RIGHT HEART CATH;  Surgeon: [name], MD;  Location: [place] [facility];  Service: Cardiovascular;  Laterality: N/A;   RIGHT HEART CATH N/A 06/23/2020   Procedure: RIGHT HEART CATH;  Surgeon: [name], MD;  Location: [place] [facility];  Service: Cardiovascular;  Laterality: N/A;   RIGHT/LEFT HEART CATH AND CORONARY ANGIOGRAPHY N/A 12/11/2019   Procedure: RIGHT/LEFT HEART CATH AND CORONARY ANGIOGRAPHY;  Surgeon: [name], MD;  Location: [place] [facility];  Service: Cardiovascular;  Laterality: N/A;   SUBMUCOSAL TATTOO INJECTION  03/02/2020   Procedure: SUBMUCOSAL TATTOO INJECTION;  Surgeon: Yetta Flock, MD;  Location: [facility] ENDOSCOPY;  Service: Gastroenterology;;   SUBMUCOSAL TATTOO INJECTION  05/31/2020   Procedure: SUBMUCOSAL TATTOO  INJECTION;  Surgeon: Irene Shipper, MD;  Location: [place];  Service: Endoscopy;;   TEE WITHOUT CARDIOVERSION N/A 08/02/2021   Procedure: TRANSESOPHAGEAL ECHOCARDIOGRAM (TEE);  Surgeon: [name], MD;  Location: [facility] ENDOSCOPY;  Service: Cardiovascular;  Laterality: N/A;   TUBAL LIGATION     XI ROBOTIC ASSISTED LOWER ANTERIOR RESECTION N/A 05/22/2018   Procedure: XI ROBOTIC ASSISTED SIGMOID COLOECTOMY MOBILIZATION OF SPLENIC FLEXURE, FIREFLY ASSESSMENT OF  PERFUSION;  Surgeon: Michael Boston, MD;  [facility];  Service: General;  Laterality: N/A;  ERAS PATHWAY    Family History  Problem Relation Age of Onset   Asthma Father    Heart attack Father    Asthma Sister    Lung cancer Sister    Heart attack Mother    Stroke Brother    Colon cancer Neg Hx    Esophageal cancer Neg Hx    Pancreatic cancer Neg Hx    Stomach cancer Neg Hx    Liver disease Neg Hx     No Known Allergies  Current Outpatient Medications on File Prior to Visit  Medication Sig Dispense Refill   amiodarone (PACERONE) 200 MG tablet Take 1 tablet (200 mg total) by mouth daily. 90 tablet 3   apixaban (ELIQUIS) 2.5 MG TABS tablet Take 1 tablet (2.5 mg total) by mouth 2 (two) times daily. 60 tablet 0   diazepam (VALIUM) 5 MG tablet 1 tab po bid prn anxiety 10 tablet 0   docusate sodium (COLACE) 100 MG capsule Take 1 capsule (100 mg total) by mouth daily. 30 capsule 0   furosemide (LASIX) 20 MG tablet Take 1 tablet (20 mg total) by mouth daily. 3 tablet 0   gabapentin (NEURONTIN) 300 MG capsule Take 1 capsule (300 mg total) by mouth daily. 180 capsule 2   levothyroxine (SYNTHROID) 137 MCG tablet Take 1 tablet (137 mcg total) by mouth daily before breakfast. 90 tablet 3   lidocaine-prilocaine (EMLA) cream 1 application. 3 (three) times a week.     nitroGLYCERIN (NITROSTAT) 0.4 MG SL tablet Place 1 tablet (0.4 mg total) under the tongue every 5 (five) minutes as needed for chest pain. 25 tablet 0   pantoprazole  (PROTONIX) 40 MG tablet Take 1 tablet (40 mg total) by mouth 2 (two) times daily before a meal. 30 tablet 2   traZODone (DESYREL) 50 MG tablet Take 0.5-1 tablets (25-50 mg total) by mouth at bedtime as needed for sleep. (Patient taking differently: Take 50 mg by mouth at bedtime as needed for sleep.) 30 tablet 3   No current facility-administered medications on file prior to visit.    BP 130/65   Pulse 90   Resp 18   Ht '4\' 11"'$  (1.499 m)   Wt 124 lb (56.2 kg)   SpO2 100%   BMI 25.04 kg/m        Objective:   Physical Exam   General- No acute distress. Pleasant patient. Neck- Full range of motion, no jvd Lungs- Clear, even and unlabored. Heart- regular rate and rhythm. Neurologic- CNII- XII grossly intact.   Lower extremity-no pedal edema presently.  Calf symmetric.  Negative Homans' sign bilaterally.     Assessment & Plan:   Patient Instructions  CHF with recent dyspnea on exertion over the weekend but clinically stable.  On review he weighs actually 2 pounds less than it was on the last visit.  Last chest x-ray showed mild CHF but your BNP/heart failure protein level was elevated more than usual.  I want you to get chest x-ray stat today.  Go ahead and fill the 3 tab Lasix prescription sent in last week.  Hold off on starting until we get x-ray results and lab results back.  We will reschedule with our lab for CMP and BNP tomorrow at 11 AM.  Recent anemia improved posthospitalization.  On dialysis with good blood pressure level today but lower blood pressure levels on dialysis days.  Anxiety insomnia improved with Valium.  Follow-up date to be determined after chest x-ray and lab review.    Mackie Pai, PA-C

## 2021-11-17 ENCOUNTER — Other Ambulatory Visit (INDEPENDENT_AMBULATORY_CARE_PROVIDER_SITE_OTHER): Payer: Medicare Other

## 2021-11-17 ENCOUNTER — Ambulatory Visit (HOSPITAL_BASED_OUTPATIENT_CLINIC_OR_DEPARTMENT_OTHER)
Admission: RE | Admit: 2021-11-17 | Discharge: 2021-11-17 | Disposition: A | Discharge status: Home / Self Care | Payer: Medicare Other | Source: Ambulatory Visit | Attending: Medical | Admitting: Medical

## 2021-11-17 ENCOUNTER — Telehealth: Payer: Self-pay

## 2021-11-17 DIAGNOSIS — I5032 Chronic diastolic (congestive) heart failure: Secondary | ICD-10-CM | POA: Diagnosis present

## 2021-11-17 LAB — COMPREHENSIVE METABOLIC PANEL
ALT: 6 U/L (ref 0–35)
AST: 15 U/L (ref 0–37)
Albumin: 4.5 g/dL (ref 3.5–5.2)
Alkaline Phosphatase: 78 U/L (ref 39–117)
BUN: 13 mg/dL (ref 6–23)
CO2: 34 mEq/L — ABNORMAL HIGH (ref 19–32)
Calcium: 9.3 mg/dL (ref 8.4–10.5)
Chloride: 91 mEq/L — ABNORMAL LOW (ref 96–112)
Creatinine, Ser: 4.55 mg/dL (ref 0.40–1.20)
GFR: 9.05 mL/min — CL (ref >60.00)
Glucose, Bld: 76 mg/dL (ref 70–99)
Potassium: 4.4 mEq/L (ref 3.5–5.1)
Sodium: 135 mEq/L (ref 135–145)
Total Bilirubin: 0.8 mg/dL (ref 0.2–1.2)
Total Protein: 7.7 g/dL (ref 6.0–8.3)

## 2021-11-17 LAB — BRAIN NATRIURETIC PEPTIDE: Pro B Natriuretic peptide (BNP): 2020 pg/mL — ABNORMAL HIGH (ref 0.0–100.0)

## 2021-11-17 NOTE — Telephone Encounter (Signed)
Pt is on dialysis, creatinine is stable  Forward to [employer]

## 2021-11-17 NOTE — Telephone Encounter (Signed)
CRITICAL VALUE STICKER  CRITICAL VALUE: Creatinine 4.55, GFR 9.05  RECEIVER (on-site recipient of call): Harleen Fineberg, RMA  DATE & TIME NOTIFIED: 11/17/2021 at 1:03pm  MESSENGER (representative from lab): Hope Scales   MD NOTIFIED: Mackie Pai, PA-C, Jessica Copland   TIME OF NOTIFICATION:1:05  RESPONSE:

## 2021-11-18 NOTE — Telephone Encounter (Signed)
Got a message back from Dr. [name] he statedthat Lasix would not help under current scenario since she is on dialysis.  He recommended dialysis center be notified so they could get the fluid off through dialysis.  So I did call patient and advise her to stop the Lasix and to pass message along regarding recent mild CHF and elevated BNP.  Patient states clinically she is doing well with no shortness of breath.  She had dialysis today.  She is going back to get dialysis on Monday.  Asked medical assistant to call over and notify dialysis center.  Also fax over recent CMP, BNP and chest x-ray.

## 2021-11-22 ENCOUNTER — Ambulatory Visit (HOSPITAL_COMMUNITY): Admit: 2021-11-22 | Payer: Medicare Other | Admitting: Internal Medicine

## 2021-11-22 ENCOUNTER — Encounter (HOSPITAL_COMMUNITY): Payer: Self-pay

## 2021-11-22 SURGERY — ESOPHAGOGASTRODUODENOSCOPY (EGD) WITH PROPOFOL
Anesthesia: Monitor Anesthesia Care

## 2021-11-23 ENCOUNTER — Ambulatory Visit: Payer: Medicare Other | Admitting: Physician Assistant

## 2021-11-23 NOTE — Progress Notes (Signed)
Remote ICD transmission.   

## 2021-11-23 NOTE — Addendum Note (Signed)
Addended by: Cheri Kearns A on: 11/23/2021 01:46 PM   Modules accepted: Level of Service

## 2021-11-25 ENCOUNTER — Other Ambulatory Visit: Payer: Self-pay

## 2021-11-25 ENCOUNTER — Emergency Department (HOSPITAL_BASED_OUTPATIENT_CLINIC_OR_DEPARTMENT_OTHER)
Admission: EM | Admit: 2021-11-25 | Discharge: 2021-11-25 | Disposition: A | Discharge status: Home / Self Care | Payer: Medicare Other | Attending: Emergency Medicine | Admitting: Emergency Medicine

## 2021-11-25 ENCOUNTER — Encounter (HOSPITAL_BASED_OUTPATIENT_CLINIC_OR_DEPARTMENT_OTHER): Payer: Self-pay | Admitting: Emergency Medicine

## 2021-11-25 DIAGNOSIS — N186 End stage renal disease: Secondary | ICD-10-CM | POA: Insufficient documentation

## 2021-11-25 DIAGNOSIS — Z7901 Long term (current) use of anticoagulants: Secondary | ICD-10-CM | POA: Diagnosis not present

## 2021-11-25 DIAGNOSIS — T829XXA Unspecified complication of cardiac and vascular prosthetic device, implant and graft, initial encounter: Secondary | ICD-10-CM | POA: Diagnosis present

## 2021-11-25 DIAGNOSIS — Z992 Dependence on renal dialysis: Secondary | ICD-10-CM | POA: Insufficient documentation

## 2021-11-25 NOTE — ED Provider Notes (Signed)
[facility] Provider Note   CSN: [id number] Arrival date & time: 11/25/21  2049     History  Dialysis graft bleeding   Morgan Clay is a 76 y.o. female ESRD on dialysis, chronic anticoagulation here for evaluation of left upper fistula graft bleeding.  Began after being dialyzed.  Went to the fire department which they placed a pressure dressing.  She subsequently came here.  She denies any lightheadedness or dizziness.  No numbness or weakness.  No known trauma to the graft.  My initial evaluation she states bleeding has stopped and she would like to go home  HPI     Home Medications Prior to Admission medications   Medication Sig Start Date End Date Taking? Authorizing Provider  amiodarone (PACERONE) 200 MG tablet Take 1 tablet (200 mg total) by mouth daily. 11/10/21   Saguier, Percell Miller, PA-C  apixaban (ELIQUIS) 2.5 MG TABS tablet Take 1 tablet (2.5 mg total) by mouth 2 (two) times daily. 10/27/21   Thurnell Lose, MD  diazepam (VALIUM) 5 MG tablet 1 tab po bid prn anxiety 11/10/21   Saguier, Percell Miller, PA-C  docusate sodium (COLACE) 100 MG capsule Take 1 capsule (100 mg total) by mouth daily. 10/27/21 11/26/21  Thurnell Lose, MD  furosemide (LASIX) 20 MG tablet Take 1 tablet (20 mg total) by mouth daily. 11/10/21   Saguier, Percell Miller, PA-C  gabapentin (NEURONTIN) 300 MG capsule Take 1 capsule (300 mg total) by mouth daily. 07/12/20   Consuelo Pandy, PA-C  levothyroxine (SYNTHROID) 137 MCG tablet Take 1 tablet (137 mcg total) by mouth daily before breakfast. 03/11/20   Billie Ruddy, MD  lidocaine-prilocaine (EMLA) cream 1 application. 3 (three) times a week. 09/08/21   [provider]  nitroGLYCERIN (NITROSTAT) 0.4 MG SL tablet Place 1 tablet (0.4 mg total) under the tongue every 5 (five) minutes as needed for chest pain. 10/27/21   Thurnell Lose, MD  pantoprazole (PROTONIX) 40 MG tablet Take 1 tablet (40 mg total) by mouth 2 (two) times  daily before a meal. 10/27/21   Thurnell Lose, MD  traZODone (DESYREL) 50 MG tablet Take 0.5-1 tablets (25-50 mg total) by mouth at bedtime as needed for sleep. Patient taking differently: Take 50 mg by mouth at bedtime as needed for sleep. 12/16/20   Saguier, Percell Miller, PA-C      Allergies    Patient has no known allergies.    Review of Systems   Review of Systems  Constitutional: Negative.   HENT: Negative.    Respiratory: Negative.    Cardiovascular: Negative.   Gastrointestinal: Negative.   Genitourinary: Negative.   Musculoskeletal: Negative.   Skin:  Positive for wound.  Neurological: Negative.   All other systems reviewed and are negative.   Physical Exam Updated Vital Signs BP (!) 136/57 (BP Location: Right Arm)   Pulse 93   Temp 98.6 F (37 C) (Oral)   Resp (!) 22   Ht '4\' 11"'$  (1.499 m)   Wt 57.2 kg   SpO2 96%   BMI 25.45 kg/m  Physical Exam Vitals and nursing note reviewed.  Constitutional:      General: She is not in acute distress.    Appearance: She is well-developed. She is not ill-appearing, toxic-appearing or diaphoretic.  HENT:     Head: Normocephalic and atraumatic.  Eyes:     Pupils: Pupils are equal, round, and reactive to light.  Cardiovascular:     Rate and Rhythm: Normal rate.  Pulses: Normal pulses.          Radial pulses are 2+ on the right side and 2+ on the left side.     Heart sounds: Normal heart sounds.  Pulmonary:     Effort: Pulmonary effort is normal. No respiratory distress.     Breath sounds: Normal breath sounds.  Abdominal:     General: Bowel sounds are normal. There is no distension.     Palpations: Abdomen is soft.  Musculoskeletal:        General: Normal range of motion.     Cervical back: Normal range of motion.     Comments: Dialysis graft left upper extremity.  Palpable thrill.  No active bleeding.  No ulcerations, redness or warmth.  Skin:    General: Skin is warm and dry.     Capillary Refill: Capillary refill  takes less than 2 seconds.  Neurological:     General: No focal deficit present.     Mental Status: She is alert and oriented to person, place, and time.     Comments: Intact sensation, full range of motion  Psychiatric:        Mood and Affect: Mood normal.     ED Results / Procedures / Treatments   Labs (all labs ordered are listed, but only abnormal results are displayed) Labs Reviewed - No data to display  EKG None  Radiology No results found.  Procedures Procedures    Medications Ordered in ED Medications - No data to display  ED Course/ Medical Decision Making/ A&P    76 year old ESRD patient chronic anticoagulant here for evaluation of bleeding to dialysis graft to left upper extremity.  Began after doing dialysis, subsequent went to the fire department where they placed a pressure dressing and encouraged her to come to ED for further.  Patient states she feels otherwise well, no lightheadedness or dizziness.  Patient states she has had intermittent bleeding to her graft since she was placed back on the Eliquis.  She is unsure when she takes this.  Initial evaluation patient states she is ready go home.  She is neurovascularly intact.  I removed her pressure dressing.  She has no evidence of skin breakdown, ulcerations.  She has no active bleeding.  She has palpable thrill.  No obvious infectious process.  She was waiting in the ED for greater than 2 hours without any bleeding I feel this is reasonable for her to discharge home, return for new or worsening symptoms.  She has no lightheaded or dizziness, do not think we need to check labs at this time. Patient and family agreeable  The patient has been appropriately medically screened and/or stabilized in the ED. I have low suspicion for any other emergent medical condition which would require further screening, evaluation or treatment in the ED or require inpatient management.  Patient is hemodynamically stable and in no  acute distress.  Patient able to ambulate in department prior to ED.  Evaluation does not show acute pathology that would require ongoing or additional emergent interventions while in the emergency department or further inpatient treatment.  I have discussed the diagnosis with the patient and answered all questions.  Pain is been managed while in the emergency department and patient has no further complaints prior to discharge.  Patient is comfortable with plan discussed in room and is stable for discharge at this time.  I have discussed strict return precautions for returning to the emergency department.  Patient was encouraged to  follow-up with PCP/specialist refer to at discharge.                            Medical Decision Making Amount and/or Complexity of Data Reviewed Independent Historian: friend External Data Reviewed: notes.  Risk OTC drugs. Prescription drug management. Parenteral controlled substances. Diagnosis or treatment significantly limited by social determinants of health.           Final Clinical Impression(s) / ED Diagnoses Final diagnoses:  Complication of arteriovenous dialysis fistula, initial encounter    Rx / DC Orders ED Discharge Orders     None         Teresita Fanton A, PA-C 11/25/21 2240    [name], MD 11/28/21 1535

## 2021-11-25 NOTE — Discharge Instructions (Signed)
Keep the bandage on the wound tonight.  May remove tomorrow.  Return for new or worsening symptoms such as lightheadedness, dizziness, persistent bleeding from

## 2021-11-25 NOTE — ED Triage Notes (Signed)
Patient reports she had blood "gushing" from her AV graft this evening.  Patient reports FD wrapped it with an ace bandage and it has since stopped.  Graft is currently wrapped, no bleeding noted at this time.  Patient is on eliquis. Patient had dialysis today.

## 2021-12-01 ENCOUNTER — Other Ambulatory Visit (HOSPITAL_COMMUNITY): Payer: Self-pay

## 2021-12-01 ENCOUNTER — Other Ambulatory Visit (HOSPITAL_COMMUNITY): Payer: Self-pay | Admitting: *Deleted

## 2021-12-01 MED ORDER — APIXABAN 2.5 MG PO TABS: 2.5000 mg | ORAL_TABLET | Freq: Two times a day (BID) | ORAL | 0 refills | Status: DC

## 2021-12-02 ENCOUNTER — Ambulatory Visit (INDEPENDENT_AMBULATORY_CARE_PROVIDER_SITE_OTHER): Payer: Medicare Other

## 2021-12-02 DIAGNOSIS — I428 Other cardiomyopathies: Secondary | ICD-10-CM | POA: Diagnosis not present

## 2021-12-02 LAB — CUP PACEART REMOTE DEVICE CHECK
Battery Remaining Longevity: 3 mo
Battery Voltage: 2.82 V
Brady Statistic AP VP Percent: 0.22 %
Brady Statistic AP VS Percent: 0 %
Brady Statistic AS VP Percent: 99.6 %
Brady Statistic AS VS Percent: 0.18 %
Brady Statistic RA Percent Paced: 0.22 %
Brady Statistic RV Percent Paced: 99.76 %
Date Time Interrogation Session: 20230728043824
HighPow Impedance: 40 Ohm
HighPow Impedance: 68 Ohm
Implantable Lead Implant Date: 20080818
Implantable Lead Implant Date: 20080818
Implantable Lead Implant Date: 20080818
Implantable Lead Location: 753858
Implantable Lead Location: 753859
Implantable Lead Location: 753860
Implantable Lead Model: 4193
Implantable Lead Model: 5076
Implantable Lead Model: 6947
Implantable Pulse Generator Implant Date: 20170531
Lead Channel Impedance Value: 266 Ohm
Lead Channel Impedance Value: 323 Ohm
Lead Channel Impedance Value: 4047 Ohm
Lead Channel Impedance Value: 4047 Ohm
Lead Channel Impedance Value: 437 Ohm
Lead Channel Impedance Value: 437 Ohm
Lead Channel Pacing Threshold Amplitude: 1 V
Lead Channel Pacing Threshold Amplitude: 1.25 V
Lead Channel Pacing Threshold Amplitude: 2.125 V
Lead Channel Pacing Threshold Pulse Width: 0.4 ms
Lead Channel Pacing Threshold Pulse Width: 0.4 ms
Lead Channel Pacing Threshold Pulse Width: 0.4 ms
Lead Channel Sensing Intrinsic Amplitude: 3.625 mV
Lead Channel Sensing Intrinsic Amplitude: 3.625 mV
Lead Channel Sensing Intrinsic Amplitude: 7.875 mV
Lead Channel Sensing Intrinsic Amplitude: 7.875 mV
Lead Channel Setting Pacing Amplitude: 2.25 V
Lead Channel Setting Pacing Amplitude: 2.5 V
Lead Channel Setting Pacing Amplitude: 2.75 V
Lead Channel Setting Pacing Pulse Width: 0.4 ms
Lead Channel Setting Pacing Pulse Width: 0.4 ms
Lead Channel Setting Sensing Sensitivity: 0.3 mV

## 2021-12-06 ENCOUNTER — Ambulatory Visit (HOSPITAL_COMMUNITY)
Admission: RE | Admit: 2021-12-06 | Discharge: 2021-12-06 | Disposition: A | Discharge status: Home / Self Care | Payer: Medicare Other | Source: Ambulatory Visit | Attending: Cardiology | Admitting: Cardiology

## 2021-12-06 ENCOUNTER — Encounter (HOSPITAL_COMMUNITY): Payer: Self-pay | Admitting: Cardiology

## 2021-12-06 ENCOUNTER — Ambulatory Visit (INDEPENDENT_AMBULATORY_CARE_PROVIDER_SITE_OTHER): Payer: Medicare Other | Admitting: Medical

## 2021-12-06 VITALS — BP 120/60 | HR 72 | Wt 125.4 lb

## 2021-12-06 VITALS — BP 130/60 | HR 78 | Resp 18 | Ht 59.0 in | Wt 125.2 lb

## 2021-12-06 DIAGNOSIS — I482 Chronic atrial fibrillation, unspecified: Secondary | ICD-10-CM

## 2021-12-06 DIAGNOSIS — G4733 Obstructive sleep apnea (adult) (pediatric): Secondary | ICD-10-CM | POA: Insufficient documentation

## 2021-12-06 DIAGNOSIS — I351 Nonrheumatic aortic (valve) insufficiency: Secondary | ICD-10-CM | POA: Diagnosis not present

## 2021-12-06 DIAGNOSIS — E1122 Type 2 diabetes mellitus with diabetic chronic kidney disease: Secondary | ICD-10-CM | POA: Insufficient documentation

## 2021-12-06 DIAGNOSIS — E669 Obesity, unspecified: Secondary | ICD-10-CM | POA: Insufficient documentation

## 2021-12-06 DIAGNOSIS — D509 Iron deficiency anemia, unspecified: Secondary | ICD-10-CM | POA: Insufficient documentation

## 2021-12-06 DIAGNOSIS — I5032 Chronic diastolic (congestive) heart failure: Secondary | ICD-10-CM

## 2021-12-06 DIAGNOSIS — F32A Depression, unspecified: Secondary | ICD-10-CM | POA: Diagnosis not present

## 2021-12-06 DIAGNOSIS — N186 End stage renal disease: Secondary | ICD-10-CM | POA: Diagnosis not present

## 2021-12-06 DIAGNOSIS — I4892 Unspecified atrial flutter: Secondary | ICD-10-CM | POA: Diagnosis not present

## 2021-12-06 DIAGNOSIS — I471 Supraventricular tachycardia: Secondary | ICD-10-CM | POA: Diagnosis present

## 2021-12-06 DIAGNOSIS — I428 Other cardiomyopathies: Secondary | ICD-10-CM | POA: Diagnosis not present

## 2021-12-06 DIAGNOSIS — Z9049 Acquired absence of other specified parts of digestive tract: Secondary | ICD-10-CM | POA: Diagnosis not present

## 2021-12-06 DIAGNOSIS — Z8711 Personal history of peptic ulcer disease: Secondary | ICD-10-CM | POA: Insufficient documentation

## 2021-12-06 DIAGNOSIS — M109 Gout, unspecified: Secondary | ICD-10-CM | POA: Insufficient documentation

## 2021-12-06 DIAGNOSIS — I132 Hypertensive heart and chronic kidney disease with heart failure and with stage 5 chronic kidney disease, or end stage renal disease: Secondary | ICD-10-CM | POA: Diagnosis not present

## 2021-12-06 DIAGNOSIS — I5022 Chronic systolic (congestive) heart failure: Secondary | ICD-10-CM

## 2021-12-06 LAB — CBC
HCT: 35.4 % — ABNORMAL LOW (ref 36.0–46.0)
Hemoglobin: 11.6 g/dL — ABNORMAL LOW (ref 12.0–15.0)
MCH: 29.9 pg (ref 26.0–34.0)
MCHC: 32.8 g/dL (ref 30.0–36.0)
MCV: 91.2 fL (ref 80.0–100.0)
Platelets: 105 10*3/uL — ABNORMAL LOW (ref 150–400)
RBC: 3.88 MIL/uL (ref 3.87–5.11)
RDW: 18.2 % — ABNORMAL HIGH (ref 11.5–15.5)
WBC: 4.8 10*3/uL (ref 4.0–10.5)
nRBC: 0 % (ref 0.0–0.2)

## 2021-12-06 MED ORDER — DIAZEPAM 5 MG PO TABS: ORAL_TABLET | ORAL | 0 refills | Status: DC

## 2021-12-06 NOTE — Progress Notes (Signed)
Subjective:    Patient ID: Morgan Clay, female    DOB: 28-Jul-1945, 76 y.o.   MRN: [id number]  HPI Pt in for follow up from the ED.  On review of that day service.  Arrival date & time: 11/25/21  2049     History   Dialysis graft bleeding     hpi   "Morgan Clay is a 76 y.o. female ESRD on dialysis, chronic anticoagulation here for evaluation of left upper fistula graft bleeding.  Began after being dialyzed.  Went to the fire department which they placed a pressure dressing.  She subsequently came here.  She denies any lightheadedness or dizziness.  No numbness or weakness.  No known trauma to the graft.  My initial evaluation she states bleeding has stopped and she would like to go home"   Musculoskeletal:        General: Normal range of motion.     Cervical back: Normal range of motion.     Comments: Dialysis graft left upper extremity.  Palpable thrill.  No active bleeding.  No ulcerations, redness or warmth.   ED Course/ Medical Decision Making/ A&P   76 year old ESRD patient chronic anticoagulant here for evaluation of bleeding to dialysis graft to left upper extremity.  Began after doing dialysis, subsequent went to the fire department where they placed a pressure dressing and encouraged her to come to ED for further.  Patient states she feels otherwise well, no lightheadedness or dizziness.  Patient states she has had intermittent bleeding to her graft since she was placed back on the Eliquis.  She is unsure when she takes this.  Initial evaluation patient states she is ready go home.  She is neurovascularly intact.  I removed her pressure dressing.  She has no evidence of skin breakdown, ulcerations.  She has no active bleeding.  She has palpable thrill.  No obvious infectious process.  She was waiting in the ED for greater than 2 hours without any bleeding I feel this is reasonable for her to discharge home, return for new or worsening symptoms.  She has no  lightheaded or dizziness, do not think we need to check labs at this time. Patient and family agreeable   The patient has been appropriately medically screened and/or stabilized in the ED. I have low suspicion for any other emergent medical condition which would require further screening, evaluation or treatment in the ED or require inpatient management.   Patient is hemodynamically stable and in no acute distress.  Patient able to ambulate in department prior to ED.  Evaluation does not show acute pathology that would require ongoing or additional emergent interventions while in the emergency department or further inpatient treatment.  I have discussed the diagnosis with the patient and answered all questions.  Pain is been managed while in the emergency department and patient has no further complaints prior to discharge.  Patient is comfortable with plan discussed in room and is stable for discharge at this time.  I have discussed strict return precautions for returning to the emergency department.  Patient was encouraged to follow-up with PCP/specialist refer to at discharge.                              Medical Decision Making Amount and/or Complexity of Data Reviewed Independent Historian: friend External Data Reviewed: notes.   Risk OTC drugs. Prescription drug management. Parenteral controlled substances. Diagnosis or treatment significantly limited by  social determinants of health."   Pt went to cardiologist today. Ekg done. Labs done today as well.  Below is the summary of cardiologist.  "No medication changes were made. Please continue all current medications as prescribed.   Dr. [name] will contact Nephrology.   Your physician recommends that you schedule a follow-up appointment soon for an echo and in 3 months with our NP/PA Clinic here in our office.    Your physician has requested that you have an echocardiogram. Echocardiography is a painless test that uses sound waves to  create images of your heart. It provides your doctor with information about the size and shape of your heart and how well your heart's chambers and valves are working. This procedure takes approximately one hour. There are no restrictions for this procedure."  Pt states her graft site is no longer bleeding.  Echo scheduled for august 17 th.    Review of Systems  Constitutional:  Negative for chills, fatigue and fever.  HENT:  Negative for congestion and drooling.   Respiratory:  Negative for cough, chest tightness, shortness of breath and wheezing.   Cardiovascular:  Negative for chest pain and palpitations.  Gastrointestinal:  Negative for abdominal pain.  Genitourinary:  Negative for dyspareunia, dysuria and flank pain.  Musculoskeletal:  Negative for back pain.  Neurological:  Negative for seizures, facial asymmetry, weakness, light-headedness and numbness.    Past Medical History:  Diagnosis Date   Acute blood loss anemia 09/24/2020   AICD (automatic cardioverter/defibrillator) present 10/06/2015   Anemia due to chronic blood loss 04/15/2020   Anemia in chronic kidney disease 01/18/2018   Angiodysplasia of small intestine 06/10/2020   Ileum - seen on capsule endoscopy 05/2020 - ablated at colonoscopy   Aortic atherosclerosis ([place]) 08/30/2017   Arthritis    "qwhere" (01/03/2018)   Asthma    reports mild asthma since childhood - had COPD on dx list from prior PCP   Atrial fibrillation with RVR ([place]) 11/17/2020   Atrial fibrillation, chronic 10/25/2017   Atypical atrial flutter ([place]) 09/28/2015   AVM (arteriovenous malformation) of small bowel, acquired 01/14/2018   Biventricular ICD (implantable cardioverter-defibrillator) in place 01/08/2007   Qualifier: Diagnosis of  By: Hassell Done FNP, Nykedtra     Bleeding pseudoaneurysm of left brachiocephalic arteriovenous fistula ([place]) 09/24/2020   Bleeding pseudoaneurysm of left brachiocephalic AV fistula ([place]) 09/24/2020   Carpal tunnel  syndrome, bilateral 06/09/2021   Chronic bronchitis ([place])    "get it most years; not this past year though" (01/03/2018)   Chronic diastolic CHF (congestive heart failure) ([place]) 04/24/2007   Annotation: secondary to nonischemic cardiomyopathy s/p CRT-D Followed by Dr. Caryl Comes in Cardiology Qualifier: Diagnosis of  By: Hassell Done FNP, Nykedtra     Chronic obstructive pulmonary disease ([place])    Chronic systolic congestive heart failure ([place])    CKD (chronic kidney disease) stage 4, GFR 15-29 ml/min  11/24/2013   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 (HCV) ([place])    HEPATITIS C - s/p treatment with Harvoni, saw hepatology, Dawn Drazek   COVID-19 virus infection 05/27/2020   Diabetes mellitus without complication [facility])    DIET CONTROLLED    Dialysis complication    Diverticulitis of left colon status post robotic low anterior to sigmoid resection 05/22/2018 07/31/2017   End stage renal disease ([place]) 03/22/2021   FIBROIDS, UTERUS 03/05/2008   Glaucoma    Gout    History of blood transfusion ~ 11/2017   Hypertension associated with diabetes ([place]) 06/07/2017  Hypothyroidism    LBBB (left bundle branch block)    Lesion of ulnar nerve 06/09/2021   Long term current use of anticoagulant    Lower GI bleeding    "been dealing w/it since 07/2017" (01/03/2018)   Malnutrition of moderate degree 07/09/2020   MDD (major depressive disorder), recurrent, in partial remission ([place]) 06/07/2017   Nonischemic cardiomyopathy ([place])    Obesity 05/27/2009   Qualifier: Diagnosis of  By: Hassell Done FNP, Nykedtra     OBSTRUCTIVE SLEEP APNEA 11/14/2007   no CPAP   OSTEOPENIA 09/30/2008   Other cirrhosis of liver ([place]) 08/12/2020   Formatting of this note is different from the original. Patient with history of cirrhosis secondary to hepatitis C with no history of hepatic decompensation.  She has had some hyponatremia and hypoalbuminemia in the past however most recent labs show relatively well-preserved liver  synthetic function.  She will have labs to update liver synthetic function.  Hepatoma screening - patient is currentl   Overweight (BMI 25.0-29.9) 03/02/2020   Pain in right knee 05/13/2018   Persistent atrial fibrillation ([place]) 02/06/2020   Pneumonia    "several times" (01/03/2018)   Polyneuropathy associated with underlying disease ([place]) 06/09/2021   Pulmonary hypertension ([place]) on echocardiogram 01/14/2018   Secondary esophageal varices without bleeding ([place]) 12/23/2020   Formatting of this note might be different from the original. Patient had trace esophageal varices during small bowel enteroscopy in October 2021, follow-up EGD in January 2022 with no varices.  She previously had GI bleeding thought to be from AVM versus GAVE.  She is no longer on carvedilol and will need surveillance endoscopy.  Patient was previously followed at [facility] and I have asked her    Secondary hypercoagulable state ([place]) 02/06/2020   Type 2 diabetes, controlled, with renal manifestation ([place]) 11/24/2013     Social History   Socioeconomic History   Marital status: Divorced    Spouse name: Not on file   Number of children: 1   Years of education: 12   Highest education level: Not on file  Occupational History   Not on file  Tobacco Use   Smoking status: Never   Smokeless tobacco: Never  Vaping Use   Vaping Use: Never used  Substance and Sexual Activity   Alcohol use: Not Currently    Comment: 01/03/2018 "couple drinks/month"   Drug use: Not Currently    Types: Marijuana    Comment: VERY INTERMITTENT    Sexual activity: Not Currently  Other Topics Concern   Not on file  Social History Narrative   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      Home Situation: lives alone      Spiritual Beliefs: Christian - Baptist      Lifestyle: no regular exercise; poor diet      She is divorced at least one adult child   Never smoker    rare alcohol to occasional at best    no substance abuse          Social Determinants of Health   Financial Resource Strain: Low Risk  (07/15/2021)   Overall Financial Resource Strain (CARDIA)    Difficulty of Paying Living Expenses: Not hard at all  Food Insecurity: No Food Insecurity (07/15/2021)   Hunger Vital Sign    Worried About Running Out of Food in the Last Year: Never true    Ran Out of Food in the Last Year: Never true  Transportation Needs: No Transportation Needs (07/15/2021)  PRAPARE - Hydrologist (Medical): No    Lack of Transportation (Non-Medical): No  Physical Activity: Not on file  Stress: No Stress Concern Present (07/15/2021)   [place]    Feeling of Stress : Not at all  Social Connections: Moderately Isolated (07/15/2021)   Social Connection and Isolation Panel [NHANES]    Frequency of Communication with Friends and Family: More than three times a week    Frequency of Social Gatherings with Friends and Family: Three times a week    Attends Religious Services: More than 4 times per year    Active Member of Clubs or Organizations: No    Attends Archivist Meetings: Never    Marital Status: Divorced  Human resources officer Violence: Not At Risk (07/15/2021)   Humiliation, Afraid, Rape, and Kick questionnaire    Fear of Current or Ex-Partner: No    Emotionally Abused: No    Physically Abused: No    Sexually Abused: No    Past Surgical History:  Procedure Laterality Date   A/V FISTULAGRAM N/A 03/28/2021   Procedure: A/V FISTULAGRAM;  Surgeon: Waynetta Sandy, MD;  Location: [place] [facility];  Service: Cardiovascular;  Laterality: N/A;   APPENDECTOMY     AV FISTULA PLACEMENT Left 08/05/2020   Procedure: LEFT UPPER EXTREMITY ARTERIOVENOUS (AV) FISTULA CREATION;  Surgeon: Cherre Robins, MD;  Location: [place];  Service: Vascular;  Laterality: Left;   [place] Left 09/16/2020   Procedure: LEFT  SECOND STAGE [place];  Surgeon: Cherre Robins, MD;  Location: [place];  Service: Vascular;  Laterality: Left;  PERIPHERAL NERVE BLOCK   CARDIAC CATHETERIZATION     CARDIOVERSION N/A 12/14/2014   Procedure: CARDIOVERSION;  Surgeon: Dorothy Spark, MD;  Location: [place];  Service: Cardiovascular;  Laterality: N/A;   CARDIOVERSION N/A 02/26/2017   Procedure: CARDIOVERSION;  Surgeon: Evans Lance, MD;  Location: [place] [facility];  Service: Cardiovascular;  Laterality: N/A;   CARDIOVERSION N/A 07/24/2017   Procedure: CARDIOVERSION;  Surgeon: Acie Fredrickson Wonda Cheng, MD;  Location[facility]COPY;  Service: Cardiovascular;  Laterality: N/A;   CARDIOVERSION N/A 02/12/2020   Procedure: CARDIOVERSION;  Surgeon: [name], MD;  Location: [facility] ENDOSCOPY;  Service: Cardiovascular;  Laterality: N/A;   CARDIOVERSION N/A 09/15/2021   Procedure: CARDIOVERSION;  Surgeon: [name], MD;  Location: [facility] ENDOSCOPY;  Service: Cardiovascular;  Laterality: N/A;   CARPAL TUNNEL RELEASE  07/21/2021   COLONOSCOPY N/A 11/08/2017   Procedure: COLONOSCOPY;  Surgeon: Milus Banister, MD;  Location: [facility] ENDOSCOPY;  Service: Endoscopy;  Laterality: N/A;   COLONOSCOPY WITH PROPOFOL N/A 05/31/2020   Procedure: COLONOSCOPY WITH PROPOFOL;  Surgeon: Irene Shipper, MD;  Location: [facility] ENDOSCOPY;  Service: Endoscopy;  Laterality: N/A;   COLONOSCOPY WITH PROPOFOL N/A 10/26/2021   Procedure: COLONOSCOPY WITH PROPOFOL;  Surgeon: Sharyn Creamer, MD;  Location: [place];  Service: Gastroenterology;  Laterality: N/A;   DILATION AND CURETTAGE OF UTERUS     ENTEROSCOPY N/A 03/02/2020   Procedure: ENTEROSCOPY;  Surgeon: Yetta Flock, MD;  Location: [facility] ENDOSCOPY;  Service: Gastroenterology;  Laterality: N/A;   EP IMPLANTABLE DEVICE N/A 10/06/2015   Procedure: BIV ICD Generator Changeout;  Surgeon: [name], MD;  Location: [place] [facility];  Service: Cardiovascular;  Laterality: N/A;    ESOPHAGOGASTRODUODENOSCOPY N/A 10/26/2017   Procedure: ESOPHAGOGASTRODUODENOSCOPY (EGD);  Surgeon: Ladene Artist, MD;  Location:  [facility] ENDOSCOPY;  Service: Endoscopy;  Laterality: N/A;   ESOPHAGOGASTRODUODENOSCOPY (EGD) WITH PROPOFOL N/A 11/07/2017   Procedure: ESOPHAGOGASTRODUODENOSCOPY (EGD) WITH PROPOFOL;  Surgeon: Milus Banister, MD;  [facility]OS[facility];  Service: Endoscopy;  Laterality: N/A;   ESOPHAGOGASTRODUODENOSCOPY (EGD) WITH PROPOFOL N/A 05/29/2020   Procedure: ESOPHAGOGASTRODUODENOSCOPY (EGD) WITH PROPOFOL;  Surgeon: Doran Stabler, MD;  Location: [place];  Service: Gastroenterology;  Laterality: N/A;   ESOPHAGOGASTRODUODENOSCOPY (EGD) WITH PROPOFOL N/A 10/26/2021   Procedure: ESOPHAGOGASTRODUODENOSCOPY (EGD) WITH PROPOFOL;  Surgeon: Sharyn Creamer, MD;  Location: [place];  Service: Gastroenterology;  Laterality: N/A;   GIVENS CAPSULE STUDY N/A 05/19/2020   Procedure: GIVENS CAPSULE STUDY;  Surgeon: [name], MD;  Location: [place];  Service: Endoscopy;  Laterality: N/A;  .adm for obs since pacemaker, PA wil enter order and see pt   GIVENS CAPSULE STUDY N/A 05/29/2020   Procedure: GIVENS CAPSULE STUDY;  Surgeon: Doran Stabler, MD;  Location: [place];  Service: Gastroenterology;  Laterality: N/A;   HEMATOMA EVACUATION Left 09/24/2020   Procedure: EVACUATION HEMATOMA ARM;  Surgeon: Rosetta Posner, MD;  Location[facility] OR;  Service: Vascular;  Laterality: Left;   HOT HEMOSTASIS N/A 10/26/2017   Procedure: HOT HEMOSTASIS (ARGON PLASMA COAGULATION/BICAP);  Surgeon: Ladene Artist, MD;  Location: [facility];  Service: Endoscopy;  Laterality: N/A;   HOT HEMOSTASIS N/A 03/02/2020   Procedure: HOT HEMOSTASIS (ARGON PLASMA COAGULATION/BICAP);  Surgeon: Yetta Flock, MD;  Location: [facility] ENDOSCOPY;  Service: Gastroenterology;  Laterality: N/A;   HOT HEMOSTASIS N/A 05/31/2020   Procedure: HOT HEMOSTASIS (ARGON PLASMA COAGULATION/BICAP);  Surgeon:  Irene Shipper, MD;  Location: [facility] ENDOSCOPY;  Service: Endoscopy;  Laterality: N/A;   INSERT / REPLACE / REMOVE PACEMAKER  ?2008   IR PERC TUN PERIT CATH WO PORT S&I /IMAG  07/05/2020   PERIPHERAL VASCULAR INTERVENTION Left 03/28/2021   Procedure: PERIPHERAL VASCULAR INTERVENTION;  Surgeon: Waynetta Sandy, MD;  Location: [place] [facility];  Service: Cardiovascular;  Laterality: Left;   POLYPECTOMY  11/08/2017   Procedure: POLYPECTOMY;  Surgeon: Milus Banister, MD;  [facility]OS[facility];  Service: Endoscopy;;   PROCTOSCOPY N/A 05/22/2018   Procedure: RIGID PROCTOSCOPY;  Surgeon: Michael Boston, MD;  [facility]S;  Service: General;  Laterality: N/A;   RIGHT HEART CATH N/A 02/13/2018   Procedure: RIGHT HEART CATH;  Surgeon: [name], MD;  Location: [place] [facility];  Service: Cardiovascular;  Laterality: N/A;   RIGHT HEART CATH N/A 06/23/2020   Procedure: RIGHT HEART CATH;  Surgeon: [name], MD;  Location: [place] [facility];  Service: Cardiovascular;  Laterality: N/A;   RIGHT/LEFT HEART CATH AND CORONARY ANGIOGRAPHY N/A 12/11/2019   Procedure: RIGHT/LEFT HEART CATH AND CORONARY ANGIOGRAPHY;  Surgeon: [name], MD;  Location: [place] [facility];  Service: Cardiovascular;  Laterality: N/A;   SUBMUCOSAL TATTOO INJECTION  03/02/2020   Procedure: SUBMUCOSAL TATTOO INJECTION;  Surgeon: Yetta Flock, MD;  Location: [facility] ENDOSCOPY;  Service: Gastroenterology;;   SUBMUCOSAL TATTOO INJECTION  05/31/2020   Procedure: SUBMUCOSAL TATTOO INJECTION;  Surgeon: Irene Shipper, MD;  Location: [place];  Service: Endoscopy;;   TEE WITHOUT CARDIOVERSION N/A 08/02/2021   Procedure: TRANSESOPHAGEAL ECHOCARDIOGRAM (TEE);  Surgeon: [name], MD;  Location: [facility] ENDOSCOPY;  Service: Cardiovascular;  Laterality: N/A;   TUBAL LIGATION     XI ROBOTIC ASSISTED LOWER ANTERIOR RESECTION N/A 05/22/2018   Procedure: XI ROBOTIC ASSISTED SIGMOID [place]  OF SPLENIC FLEXURE, FIREFLY ASSESSMENT OF PERFUSION;  Surgeon: Michael Boston, MD;  [facility]S;  Service: General;  Laterality: N/A;  ERAS PATHWAY    Family History  Problem Relation Age of Onset   Asthma Father    Heart attack Father    Asthma Sister    Lung cancer Sister    Heart attack Mother    Stroke Brother    Colon cancer Neg Hx    Esophageal cancer Neg Hx    Pancreatic cancer Neg Hx    Stomach cancer Neg Hx    Liver disease Neg Hx     No Known Allergies  Current Outpatient Medications on File Prior to Visit  Medication Sig Dispense Refill   amiodarone (PACERONE) 200 MG tablet Take 1 tablet (200 mg total) by mouth daily. 90 tablet 3   apixaban (ELIQUIS) 2.5 MG TABS tablet Take 1 tablet (2.5 mg total) by mouth 2 (two) times daily. 60 tablet 0   diazepam (VALIUM) 5 MG tablet 1 tab po bid prn anxiety 10 tablet 0   gabapentin (NEURONTIN) 300 MG capsule Take 1 capsule (300 mg total) by mouth daily. 180 capsule 2   levothyroxine (SYNTHROID) 137 MCG tablet Take 1 tablet (137 mcg total) by mouth daily before breakfast. 90 tablet 3   lidocaine-prilocaine (EMLA) cream 1 application. 3 (three) times a week.     nitroGLYCERIN (NITROSTAT) 0.4 MG SL tablet Place 1 tablet (0.4 mg total) under the tongue every 5 (five) minutes as needed for chest pain. 25 tablet 0   pantoprazole (PROTONIX) 40 MG tablet Take 1 tablet (40 mg total) by mouth 2 (two) times daily before a meal. 30 tablet 2   traZODone (DESYREL) 50 MG tablet Take 0.5-1 tablets (25-50 mg total) by mouth at bedtime as needed for sleep. 30 tablet 3   No current facility-administered medications on file prior to visit.    BP 130/60   Pulse 78   Resp 18   Ht '4\' 11"'$  (1.499 m)   Wt 125 lb 3.2 oz (56.8 kg)   SpO2 99%   BMI 25.29 kg/m        Objective:   Physical Exam  General- No acute distress. Pleasant patient. Neck- Full range of motion, no jvd Lungs- Clear, even and unlabored. Heart- regular rate and  rhythm. Neurologic- CNII- XII grossly intact.  Lower ext- no pedal edema. Left upper ext  - Dialysis graft left upper extremity.  Palpable thrill.  No active bleeding.  No ulcerations, redness or warmth.      Assessment & Plan:   Patient Instructions  Chf chronic with dialysis. Cardiologist recently advised me that at this point modifications need to be made to compensate thru dialysis. Today cardiology note mentioned cardilogist will be talking to nephrologist.   Hopefully modifications will be made to dialysis.  Follow thru with upcoming echo on August 17th.   Continue current meds cardiolgist rx'd today.  Labs done last week at nephrologist are stable/over all good in light of your chronic health conditions.  Follow up  1 month or sooner if needed.   Mackie Pai, PA-C

## 2021-12-06 NOTE — Patient Instructions (Addendum)
Chf chronic with dialysis. Cardiologist recently advised me that at this point modifications need to be made to compensate thru dialysis. Today cardiology note mentioned cardilogist will be talking to nephrologist.   Hopefully modifications will be made to dialysis.  Follow thru with upcoming echo on August 17 th.   Continue current meds cardiologist rx'd today.  Labs done last week at nephrologist are stable/over all good in light of your chronic health conditions.  For anxiety continue to use valium sparingly. Will go ahead refill valium today.  Follow up  1 month or sooner if needed.

## 2021-12-06 NOTE — Patient Instructions (Signed)
EKG done today.  Labs done today. We will contact you only if your labs are abnormal.  No medication changes were made. Please continue all current medications as prescribed.  Dr. [name] will contact Nephrology.  Your physician recommends that you schedule a follow-up appointment soon for an echo and in 3 months with our NP/PA Clinic here in our office.   Your physician has requested that you have an echocardiogram. Echocardiography is a painless test that uses sound waves to create images of your heart. It provides your doctor with information about the size and shape of your heart and how well your heart's chambers and valves are working. This procedure takes approximately one hour. There are no restrictions for this procedure.  If you have any questions or concerns before your next appointment please send [name] a message through [place] or call our office at [phone].    TO LEAVE A MESSAGE FOR THE NURSE SELECT OPTION 2, PLEASE LEAVE A MESSAGE INCLUDING: YOUR NAME DATE OF BIRTH CALL BACK NUMBER REASON FOR CALL**this is important as we prioritize the call backs  YOU WILL RECEIVE A CALL BACK THE SAME DAY AS LONG AS YOU CALL BEFORE 4:00 PM   Do the following things EVERYDAY: Weigh yourself in the morning before breakfast. Write it down and keep it in a log. Take your medicines as prescribed Eat low salt foods--Limit salt (sodium) to 2000 mg per day.  Stay as active as you can everyday Limit all fluids for the day to less than 2 liters   At the [facility], you and your health needs are our priority. As part of our continuing mission to provide you with exceptional heart care, we have created designated Provider Care Teams. These Care Teams include your primary Cardiologist (physician) and Advanced Practice Providers (APPs- Physician Assistants and Nurse Practitioners) who all work together to provide you with the care you need, when you need it.   You may see any of  the following providers on your designated Care Team at your next follow up: Dr Glori Bickers Dr Haynes Kerns, NP Lyda Jester, [place] Audry Riles, PharmD   Please be sure to bring in all your medications bottles to every appointment.

## 2021-12-06 NOTE — Progress Notes (Signed)
PCP: Dr. Colin Benton Cardiology: Dr. Caryl Comes HF Cardiology: Dr. [name]  76 y.o. with history of nonischemic cardiomyopathy with recovery of EF with BiV pacing, recurrent GI bleeding, and paroxysmal atrial flutter was referred by Dr. Caryl Comes for evaluation of CHF.  Patient has a history of presumed nonischemic cardiomyopathy for a number of years.  Echo in 12/15 showed EF 25-30% and Cardiolite at that time showed no ischemia.  She had Medtronic CRT-D system placed, and EF recovered. In 4/19, echo showed EF 60-65%.    She has additionally had problems with GI bleeding.  She had been on Eliquis for atrial flutter but this was stopped with recurrent GI bleeding.  She had bleeding from a gastric ulcer but was also noted to have multiple small bowel AVMs on capsule endoscopy out of reach of endoscope.  Eliquis was stopped. She has had no melena or BRBPR off Eliquis.   Echo in 10/19 showed EF 55-60%, mild-moderate MR, moderate AI, moderate RV dilation.   She was admitted in 1/20 with diverticulitis and ended up having a sigmoid colectomy.    Echo in 9/20 showed EF lower at 45-50%, moderate AI.   She had LHC/RHC in 8/21 with nonobstructive CAD, normal filling pressures, and preserved cardiac output. Echo 8/21 showed EF 50-55%, mild LVH, mildly decreased RV systolic function, moderate aortic insufficiency.  Back in in A fib the end of October. Amio was increased and DC-CV was set up for November 19th.  Chemically converted so DC-CV canceled.    Seen in HF Clinic in 10/21 and was more fatigued. Found to be in atrial flutter but hgb also 6.9. She was on Eliquis at the time. Loaded on amio and converted to NSR. Patient underwent small bowel enteroscopy 03/02/2020 with findings trace esophageal varix, small superficial AVMs prepyloric stomach with other punctate vascular lesions treated with APC, benign duodenal polyp.  Etiology likely secondary to jejunal AVM versus early GAVE vs portal gastropathy.Patient  underwent small bowel enteroscopy 03/02/2020 with findings trace esophageal varix, small superficial AVMs prepyloric stomach with other punctate vascular lesions treated with APC, benign duodenal polyp.  Etiology likely secondary to jejunal AVM versus early GAVE vs portal gastropathy. Eliquis restarted.    Hgb dropped back down to 7.7 in 12/21. Saw [name] and capsule placed but got stuck in the stomach.    Had volume overload on device in 05/24/20 and torsemide increased by Dr. [name]    She was admitted on 05/27/20 with dizziness, and a fall preceded by melena found to have further GI bleeding hgb of 5.1 and an AKI.  Eliquis was held and she was transfused 2u PRBC's.  Repeat pill endoscope showed bleeding in distal small bowel.  Colonoscopy found no bleeding but large avm in cecum argon lasered.  She has been off anticoagulants.   Admitted 06/23/20 from [place] with markedly elevated filling pressures. Aggressively diuresed but had worsening renal function and progressed to ESRD. Nephrology consulted. She tolerated HD well and is on MWF schedule.  Echo in 2/22 showed EF 55-60%, low normal RV systolic function.   Saw EP, turned down for Watchman due to comorbidities.   Echo in 2/23 showed EF 55-60%, moderate LVH, moderate RV enlargement with mildly decreased RV systolic function, moderate TR, ?moderate-severe AI.   Underwent TEE only (3/23) as she spontaneously converted to NSR, showing mild to moderate MR, mod to severe AI. Unable to get MRI due to device.    In 5/23, she had DCCV to NSR (was in  atrial tachycardia).  She had recurrence of atrial tachycardia after 5/23 DCCV.  This was transient.  She was seen by Dr. Curt Bears for evaluation for atrial tachycardia, but he thought that she was too high risk for ablation.   In 6/23, she developed a diverticular bleed, Eliquis was stopped transiently.   Today she returns for HF follow up. She is in NSR today.  No further BRBPR/melena.  Weight down 5 lbs.   She is still short of breath after walking 20-30 feet.  She remains very easily fatigued.  No chest pain.    ECG (personally reviewed): NSR, BiV paced  Medtronic device interrogation: >99% BiV pacing, thoracic impedance low x months.   Labs (9/19):  Hgb 10.2, K 3.8, creatinine 2.5 Labs (10/19): K 3.4, creatinine 2.2, WBC 76.  Labs (2/20): K 3.9, creatinine 1.8 Labs (8/20): K 3.8, creatinine 1.98 Labs (3/21): LDL 33 Labs (5/21): K 3.6, creatinine 1.69 Labs (7/21): K 4, creatinine 1.94 Labs (9/21): K 4.5, creatinine 2.24, TSH normal, HFTs normal  Labs (11/21): K 4.2 Creatinine 3.05  Labs (1/22): K 4.0, creatinine 2.22 Labs (2/22): K 4.1, creatinine 2.54 Labs (8/22): TSH 6.06 Labs (9/22): LFTs normal, hgb 10.5.  Labs (7/23): BNP 2020, hgb 11, LFTs normal  PMH: 1. Diabetes: diet-controlled.  2. Gout 3. HCV: Treated with Harvoni.  4. Atrial flutter/atrial tachycardia: Atypical.  Had DCCV in 3/19, maintained on amiodarone.  - DCCV in 5/23 5. HTN 6. ESRD: MWF HD 7. H/o SBO 8. Obesity 9. Nonischemic cardiomyopathy: Echo in 12/15 with EF 25-30%.  Cardiolite in 12/15 with no ischemia.  She had a Medtronic CRT-D device placed with recovery of EF.   - Echo (4/19): EF 60-65% with moderate AI, moderate MR, and PASP 49 mmHg.  - RHC 02/13/2018 RA mean 6, RV 49/7,PA 50/18, mean 31,PCWP mean 20, Oxygen saturations:PA 67% AO 97%, Cardiac Output (Fick) 4.6, Cardiac Index (Fick) 2.9, PVR 2.4 WU - Echo (10/19): EF 55-60%, mild-moderate MR, moderate AI, moderate RV dilation, PASP 60 mmHg.  - PYP scan (3/20): H/CL 1.3, grade 1-2 visually.  Unlikely transthyretin cardiac amyloidosis.  - Echo (9/20): EF 45-50%, moderate aortic insufficiency.  - LHC/RHC (8/21): 40% pLAD, 50% D1; mean RA 3, PA 41/16, mean PCWP 14, CI 2.74, PVR 2.99.  - Echo (8/21): EF 50-55%, mild LVH, grade 2 diastolic dysfunction, normal RV size with mildly decreased systolic function, moderate AI, no AS - PYP scan (1/22): H/CL 1.1,  grade 1-2 => probably negative for TTR cardiac amyloidosis.  - Echo 07/2020  EF 55-60%, moderate LVH, D-shaped interventricular septum, RV moderately dilated with low normal function, severe biatrial enlargement, severe TR.   - RHC 06/2020:  RA mean 21 RV 49/20 PA 59/24, mean 39 PCWP mean 26 Cardiac Output (Fick) 5.07 Cardiac Index (Fick) 3.23 - Echo (2/23): EF 55-60%, moderate LVH, moderate RV enlargement with mildly decreased RV systolic function, moderate TR, ?moderate-severe AI.  PVR 2.6 WU Cardiac Output (Thermo) 4.72 Cardiac Index (Thermo) 3.01 PVR 2.75 WU PAPI 1.7.  10. GI bleeding: Gastric AVM and also gastric ulcer seen in past.  - 8/19 capsule endoscopy showed small bowel AVMs beyond the reach of endoscopy.  - With recurrent episodes of GI bleeding, Eliquis was stopped.  - Capsule study 1/22 showed bleeding in distal small bowel.  Colonoscopy found no bleeding but large avm in cecum argon lasered.  - TEE (3/23): EF 60-65%, moderate LVH, RV ok, moderate to severe MR, moderate to severe AI with PHT 413  msec and vena contracta 0.49 cm (moderate AI by measurements). 11. Fe deficiency anemia.  12. Depression. 13. OSA: Uses CPAP.  14. Diverticulitis: Sigmoid colectomy in 1/20.   ROS: All systems reviewed and negative except as per HPI.   Social History   Socioeconomic History   Marital status: Divorced    Spouse name: Not on file   Number of children: 1   Years of education: 12   Highest education level: Not on file  Occupational History   Not on file  Tobacco Use   Smoking status: Never   Smokeless tobacco: Never  Vaping Use   Vaping Use: Never used  Substance and Sexual Activity   Alcohol use: Not Currently    Comment: 01/03/2018 "couple drinks/month"   Drug use: Not Currently    Types: Marijuana    Comment: VERY INTERMITTENT    Sexual activity: Not Currently  Other Topics Concern   Not on file  Social History Narrative   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       Home Situation: lives alone      Spiritual Beliefs: Christian - [employer]      Lifestyle: no regular exercise; poor diet      She is divorced at least one adult child   Never smoker    rare alcohol to occasional at best    no substance abuse         Social Determinants of Health   Financial Resource Strain: Low Risk  (07/15/2021)   Overall Financial Resource Strain (CARDIA)    Difficulty of Paying Living Expenses: Not hard at all  Food Insecurity: No Food Insecurity (07/15/2021)   Hunger Vital Sign    Worried About Running Out of Food in the Last Year: Never true    [place] in the Last Year: Never true  Transportation Needs: No Transportation Needs (07/15/2021)   PRAPARE - Hydrologist (Medical): No    Lack of Transportation (Non-Medical): No  Physical Activity: Not on file  Stress: No Stress Concern Present (07/15/2021)   [place]    Feeling of Stress : Not at all  Social Connections: Moderately Isolated (07/15/2021)   Social Connection and Isolation Panel [NHANES]    Frequency of Communication with Friends and Family: More than three times a week    Frequency of Social Gatherings with Friends and Family: Three times a week    Attends Religious Services: More than 4 times per year    Active Member of Clubs or Organizations: No    Attends Archivist Meetings: Never    Marital Status: Divorced  Human resources officer Violence: Not At Risk (07/15/2021)   Humiliation, Afraid, Rape, and Kick questionnaire    Fear of Current or Ex-Partner: No    Emotionally Abused: No    Physically Abused: No    Sexually Abused: No   Family History  Problem Relation Age of Onset   Asthma Father    Heart attack Father    Asthma Sister    Lung cancer Sister    Heart attack Mother    Stroke Brother    Colon cancer Neg Hx    Esophageal cancer Neg Hx    Pancreatic cancer Neg Hx    Stomach  cancer Neg Hx    Liver disease Neg Hx    Current Outpatient Medications  Medication Sig Dispense Refill   amiodarone (PACERONE) 200 MG  tablet Take 1 tablet (200 mg total) by mouth daily. 90 tablet 3   apixaban (ELIQUIS) 2.5 MG TABS tablet Take 1 tablet (2.5 mg total) by mouth 2 (two) times daily. 60 tablet 0   gabapentin (NEURONTIN) 300 MG capsule Take 1 capsule (300 mg total) by mouth daily. 180 capsule 2   levothyroxine (SYNTHROID) 137 MCG tablet Take 1 tablet (137 mcg total) by mouth daily before breakfast. 90 tablet 3   lidocaine-prilocaine (EMLA) cream 1 application. 3 (three) times a week.     nitroGLYCERIN (NITROSTAT) 0.4 MG SL tablet Place 1 tablet (0.4 mg total) under the tongue every 5 (five) minutes as needed for chest pain. 25 tablet 0   pantoprazole (PROTONIX) 40 MG tablet Take 1 tablet (40 mg total) by mouth 2 (two) times daily before a meal. 30 tablet 2   traZODone (DESYREL) 50 MG tablet Take 0.5-1 tablets (25-50 mg total) by mouth at bedtime as needed for sleep. 30 tablet 3   diazepam (VALIUM) 5 MG tablet 1 tab po bid prn anxiety 10 tablet 0   No current facility-administered medications for this encounter.   BP 120/60   Pulse 72   Wt 56.9 kg (125 lb 6.4 oz)   SpO2 99%   BMI 25.33 kg/m    Wt Readings from Last 3 Encounters:  12/06/21 56.8 kg (125 lb 3.2 oz)  12/06/21 56.9 kg (125 lb 6.4 oz)  11/25/21 57.2 kg (126 lb)   Physical Exam: General: NAD Neck: JVP 12-14, no thyromegaly or thyroid nodule.  Lungs: Clear to auscultation bilaterally with normal respiratory effort. CV: Nondisplaced PMI.  Heart regular S1/S2, no S3/S4, 2/6 HSM LLSB.  No peripheral edema.  No carotid bruit.  Normal pedal pulses.  Abdomen: Soft, nontender, no hepatosplenomegaly, no distention.  Skin: Intact without lesions or rashes.  Neurologic: Alert and oriented x 3.  Psych: Normal affect. Extremities: No clubbing or cyanosis.  HEENT: Normal.   Assessment/Plan: 1. NICM, recovered, now  with primarily diastolic CHF:  Echo in 8/29 showed EF improved to 60-65% with MDT BiV pacing. Echo in 10/19 showed stable EF 55-60% with mild-moderate MR, moderate AI. PYP scan in 3/20 was equivocal for transthyretin amyloidosis, repeat PYP scan in 1/22 was probably negative for TTR cardiac amyloidosis.  Echo in 9/20 showed EF back down to 45-50%. LHC/RHC in 8/21 showed nonobstructive CAD, normal filling pressures and cardiac output. Last echo 2/23 showed EF 55-60%, moderate LVH, moderate RV enlargement with mildly decreased RV systolic function, moderate TR, ? moderate-severe AI.  TEE (3/23) EF 60-65%, moderate LVH, RV ok, moderate to severe MR, moderate to severe AI. Volume now managed by HD. NYHA class IIIb symptoms chronically. Volume overloaded on exam. Optivol > threshold x months.  - HD for volume management, would recommend lowering her dry weight.  - Off Coreg and Bidil with low BP at HD.  2. Atrial fibrillation/atrial flutter/atrial tachycardia: Paroxysmal.  She has had multiple GI bleeds, and she was deemed a poor Watchman candidate due to comorbidities by Dr. Quentin Ore.  Currently no evidence for overt bleeding.  Seen by Dr. Curt Bears, not thought to be candidate for atrial tachycardia ablation due to high risk.  She is in NSR today.  - Continue Eliquis 2.5 mg bid. CBC today.  - Continue amiodarone, check LFTs and TSH.  She needs regular eye exam.  3. ESRD: Now on HD M-W-F - Would recommend lower dry weight.  4. Anemia, chronic blood loss: Long history multiple bleeding events while  on Eliquis and around 3 endoscopy/colonoscopies over the last 3 years. Sigmoid colectomy for recurrent diverticulitis. Eliquis has been stopped and started back in the past.  Cecal avm ablated most recently.  - CBC today.  5. Cirrhosis: S/p treatment with Harvoni for Hep C, followed by GI outpatient.     6. Aortic insufficiency: ? moderate-severe on last echo.  TEE 3/23 showed moderate to severe AI (PHT 413 msec and  vena contracta 0.49 cm).  She would likely be a TAVR candidate. Cannot do cardiac MRI as ICD not compatible. - Repeat echo to reassess aortic insufficiency => consider TAVR if clearly severe.   Followup with APP 3 months.   Loralie Champagne  12/06/2021

## 2021-12-08 ENCOUNTER — Encounter: Payer: Self-pay | Admitting: Physician Assistant

## 2021-12-08 ENCOUNTER — Ambulatory Visit: Payer: Medicare Other | Admitting: Physician Assistant

## 2021-12-08 VITALS — BP 118/72 | HR 76 | Ht 59.0 in | Wt 120.0 lb

## 2021-12-08 DIAGNOSIS — Z8719 Personal history of other diseases of the digestive system: Secondary | ICD-10-CM | POA: Diagnosis not present

## 2021-12-08 DIAGNOSIS — K7460 Unspecified cirrhosis of liver: Secondary | ICD-10-CM | POA: Diagnosis not present

## 2021-12-08 NOTE — Patient Instructions (Signed)
  If you are age 74 or older, your body mass index should be between 23-30. Your Body mass index is 24.24 kg/m. If this is out of the aforementioned range listed, please consider follow up with your Primary Care Provider.  The [facility] providers would like to encourage you to use [facility] to communicate with providers for non-urgent requests or questions.  Due to long hold times on the telephone, sending your provider a message by [facility] may be a faster and more efficient way to get a response.  Please allow 48 business hours for a response.  Please remember that this is for non-urgent requests.  _______________________________________________________  CONTINUE Pantoprazole '40mg'$  mg twice daily for 8 weeks then decrease to daily.  You will follow up with our office on an as needed basis.  Thank you for entrusting me with your care and choosing [facility].  [name], PA-C

## 2021-12-08 NOTE — Progress Notes (Signed)
Chief Complaint: Follow-up after recent hospitalization  HPI:    Morgan Clay is a 76 year old African-American female with a past medical history as listed below including CVA and A-fib/flutter on Eliquis, hep C cirrhosis status posttreatment with Harvoni, CHF, nonischemic cardiomyopathy, status post pacemaker, ESRD on HD Monday Wednesday Friday, sigmoid colectomy in 2020, OSA, anxiety and multiple others, known to [name], who presents to clinic today for follow-up after recent hospitalization for hematochezia.    09/13/2021 patient seen in clinic by Alonza Bogus, PA-C for follow-up for hepatic cirrhosis.  At that time Morgan at [facility] had referred her over here for her surveillance EGD.    10/25/2021 patient consulted by her service in the hospital for painless large-volume hematochezia.  Patient had EGD and colonoscopy.    10/26/2021 colonoscopy with diverticulosis in the descending colon and cecum and internal hemorrhoids and otherwise normal.  It was suspected she had a diverticular bleed which had since resolved.  EGD on the same day with erosive gastropathy and otherwise normal.  Patient to use her Pantoprazole 40 mg twice a day x8 weeks and then daily.    12/06/21 CBC with a hemoglobin of 11.6 (11.0 on 11/10/2021).    Today, patient tells me she is doing well.  She just had recent lab work which shows an increasing hemoglobin.  She feels good and has no complaints or concerns today.  Continues on her Pantoprazole 40 twice daily for now.    Denies fever, chills, weight loss, continued bleeding or abdominal pain.        Previous GI work-up: Hx GI bleeding and associated anemia with multiple work-ups in the past.  Treated with Feraheme, 2 PRBCs in 02/2020, 2 PRBCs in 04/2020.  Does not take or require PPI or antacids 10/26/2017 EGD:  For melena.  Benign, nonobstructing esophageal stenosis. Solitary, nonbleeding AVM in stomach treated with APC.  Normal duodenum to D2 11/07/2017 EGD: For  melena a week and a half following EGD w APC of gastric AVM.  Small ulcer at site of recently APCd AVM, likley source for bleeding 11/08/2017 colonoscopy: Pandiverticulosis.  4 mm polyp removed from transverse colon.  Suspect anemia related to the gastric AVM treated 2 weeks prior. 12/2017 capsule endoscopy: Complete study with fair prep.  1 medium to large, nonbleeding AVM at 2 hours 56 minutes is 1 hour and 30 minutes beyond first duodenal image.  Otherwise negative study.  This AVM not reachable by enteroscopy and if further active bleeding would need referral for deep enteroscopy 03/02/2020  small bowel enteroscopy showed trace column esophageal varix w/o stigmata or bleeding.  Superficial AVMs and punctate lesions in the prepylorus were obliterated with APC.  Some scope trauma in the stomach noted.  Deviously placed tattoo at the extent of prior enteroscopy noted.  Dr. Arelia Longest suspected bleeding and anemia from the gastric findings but more distal, nonvisualized, distal small bowel AVMs could also be a source. Recently tested positive for COVID-19 as of 12/27.  This resulted in postponement of planned capsule endoscopy. 05/19/20 VCE.  Non-diagnostic as capsule never left the stomach. 05/29/2020 EGD.  For melena.  Study normal.  Video capsule enteroscope placed. 1/22/022 # 2 VCE.  Generally good prep though some areas obscured by retained food debris and fluid.  Small lymphangiectasia at distal duodenum/proximal jejunum.  Nonbleeding AVM at 1 hour from first duodenal image.  Active bleeding without clear source at 2 hours 50 minutes from first duodenal image.  Capsule retained at this  area for about 15 minutes but eventually passed into the cecum but visualization limited by presence of blood.  18 minutes after first image of bleeding there was good imaging of the IC valve.  Suspect active bleeding in distal ileum and/or terminal ileum 05/31/2020 colonoscopy.  Solitary, tiny, nonbleeding AVM at ileum treated  with APC.  Not clear if this was culprit for bleed but it was the only lesion noted and thus treated.  Examined portion of ileum to 60 cm was normal, tattoo placed at proximal extent of exam.  Sigmoid diverticulosis and changes consistent with prior rectosigmoid resection.   Due for outpt surveillance EGD on 7/18 at [facility].  Past Medical History:  Diagnosis Date   Acute blood loss anemia 09/24/2020   AICD (automatic cardioverter/defibrillator) present 10/06/2015   Anemia due to chronic blood loss 04/15/2020   Anemia in chronic kidney disease 01/18/2018   Angiodysplasia of small intestine 06/10/2020   Ileum - seen on capsule endoscopy 05/2020 - ablated at colonoscopy   Aortic atherosclerosis ([place]) 08/30/2017   Arthritis    "qwhere" (01/03/2018)   Asthma    reports mild asthma since childhood - had COPD on dx list from prior PCP   Atrial fibrillation with RVR ([place]) 11/17/2020   Atrial fibrillation, chronic 10/25/2017   Atypical atrial flutter ([place]) 09/28/2015   AVM (arteriovenous malformation) of small bowel, acquired 01/14/2018   Biventricular ICD (implantable cardioverter-defibrillator) in place 01/08/2007   Qualifier: Diagnosis of  By: Hassell Done FNP, Nykedtra     Bleeding pseudoaneurysm of left brachiocephalic arteriovenous fistula ([place]) 09/24/2020   Bleeding pseudoaneurysm of left brachiocephalic AV fistula ([place]) 09/24/2020   Carpal tunnel syndrome, bilateral 06/09/2021   Chronic bronchitis ([place])    "get it most years; not this past year though" (01/03/2018)   Chronic diastolic CHF (congestive heart failure) ([place]) 04/24/2007   Annotation: secondary to nonischemic cardiomyopathy s/p CRT-D Followed by Dr. Caryl Comes in Cardiology Qualifier: Diagnosis of  By: Hassell Done FNP, Nykedtra     Chronic obstructive pulmonary disease ([place])    Chronic systolic congestive heart failure ([place])    CKD (chronic kidney disease) stage 4, GFR 15-29 ml/min  11/24/2013   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  (HCV) ([place])    HEPATITIS C - s/p treatment with Harvoni, saw hepatology, Morgan Clay   COVID-19 virus infection 05/27/2020   Diabetes mellitus without complication ([place])    DIET CONTROLLED    Dialysis complication    Diverticulitis of left colon status post robotic low anterior to sigmoid resection 05/22/2018 07/31/2017   End stage renal disease ([place]) 03/22/2021   FIBROIDS, UTERUS 03/05/2008   Glaucoma    Gout    History of blood transfusion ~ 11/2017   Hypertension associated with diabetes ([place]) 06/07/2017   Hypothyroidism    LBBB (left bundle branch block)    Lesion of ulnar nerve 06/09/2021   Long term current use of anticoagulant    Lower GI bleeding    "been dealing w/it since 07/2017" (01/03/2018)   Malnutrition of moderate degree 07/09/2020   MDD (major depressive disorder), recurrent, in partial remission ([place]) 06/07/2017   Nonischemic cardiomyopathy ([place])    Obesity 05/27/2009   Qualifier: Diagnosis of  By: Hassell Done FNP, Nykedtra     OBSTRUCTIVE SLEEP APNEA 11/14/2007   no CPAP   OSTEOPENIA 09/30/2008   Other cirrhosis of liver ([place]) 08/12/2020   Formatting of this note is different from the original. Patient with history of cirrhosis secondary to hepatitis C with no  history of hepatic decompensation.  She has had some hyponatremia and hypoalbuminemia in the past however most recent labs show relatively well-preserved liver synthetic function.  She will have labs to update liver synthetic function.  Hepatoma screening - patient is currentl   Overweight (BMI 25.0-29.9) 03/02/2020   Pain in right knee 05/13/2018   Persistent atrial fibrillation ([place]) 02/06/2020   Pneumonia    "several times" (01/03/2018)   Polyneuropathy associated with underlying disease ([place]) 06/09/2021   Pulmonary hypertension ([place]) on echocardiogram 01/14/2018   Secondary esophageal varices without bleeding ([place]) 12/23/2020   Formatting of this note might be different from the original. Patient had trace  esophageal varices during small bowel enteroscopy in October 2021, follow-up EGD in January 2022 with no varices.  She previously had GI bleeding thought to be from AVM versus GAVE.  She is no longer on carvedilol and will need surveillance endoscopy.  Patient was previously followed at [place] and I have asked her    Secondary hypercoagulable state ([place]) 02/06/2020   Type 2 diabetes, controlled, with renal manifestation ([place]) 11/24/2013    Past Surgical History:  Procedure Laterality Date   A/V FISTULAGRAM N/A 03/28/2021   Procedure: A/V FISTULAGRAM;  Surgeon: Waynetta Sandy, MD;  Location: [place] [facility];  Service: Cardiovascular;  Laterality: N/A;   APPENDECTOMY     AV FISTULA PLACEMENT Left 08/05/2020   Procedure: LEFT UPPER EXTREMITY ARTERIOVENOUS (AV) FISTULA CREATION;  Surgeon: Cherre Robins, MD;  Location: [place];  Service: Vascular;  Laterality: Left;   [place] Left 09/16/2020   Procedure: LEFT SECOND STAGE [place];  Surgeon: Cherre Robins, MD;  Location: [place];  Service: Vascular;  Laterality: Left;  PERIPHERAL NERVE BLOCK   CARDIAC CATHETERIZATION     CARDIOVERSION N/A 12/14/2014   Procedure: CARDIOVERSION;  Surgeon: Dorothy Spark, MD;  Location: [place];  Service: Cardiovascular;  Laterality: N/A;   CARDIOVERSION N/A 02/26/2017   Procedure: CARDIOVERSION;  Surgeon: Evans Lance, MD;  Location: [place] [facility];  Service: Cardiovascular;  Laterality: N/A;   CARDIOVERSION N/A 07/24/2017   Procedure: CARDIOVERSION;  Surgeon: Acie Fredrickson Wonda Cheng, MD;  Location: [facility] ENDOSCOPY;  Service: Cardiovascular;  Laterality: N/A;   CARDIOVERSION N/A 02/12/2020   Procedure: CARDIOVERSION;  Surgeon: [name], MD;  Location: [facility] ENDOSCOPY;  Service: Cardiovascular;  Laterality: N/A;   CARDIOVERSION N/A 09/15/2021   Procedure: CARDIOVERSION;  Surgeon: [name], MD;  Location: [facility] ENDOSCOPY;  Service:  Cardiovascular;  Laterality: N/A;   CARPAL TUNNEL RELEASE  07/21/2021   COLONOSCOPY N/A 11/08/2017   Procedure: COLONOSCOPY;  Surgeon: Milus Banister, MD;  Location: [facility]COPY;  Service: Endoscopy;  Laterality: N/A;   COLONOSCOPY WITH PROPOFOL N/A 05/31/2020   Procedure: COLONOSCOPY WITH PROPOFOL;  Surgeon: Irene Shipper, MD;  Location: [facility] ENDOSCOPY;  Service: Endoscopy;  Laterality: N/A;   COLONOSCOPY WITH PROPOFOL N/A 10/26/2021   Procedure: COLONOSCOPY WITH PROPOFOL;  Surgeon: Sharyn Creamer, MD;  Location: [address];  Service: Gastroenterology;  Laterality: N/A;   DILATION AND CURETTAGE OF UTERUS     ENTEROSCOPY N/A 03/02/2020   Procedure: ENTEROSCOPY;  Surgeon: Yetta Flock, MD;  Location: [facility] ENDOSCOPY;  Service: Gastroenterology;  Laterality: N/A;   EP IMPLANTABLE DEVICE N/A 10/06/2015   Procedure: BIV ICD Generator Changeout;  Surgeon: [name], MD;  Location: [place] [facility];  Service: Cardiovascular;  Laterality: N/A;   ESOPHAGOGASTRODUODENOSCOPY N/A 10/26/2017   Procedure: ESOPHAGOGASTRODUODENOSCOPY (EGD);  Surgeon: Ladene Artist, MD;  Location: [facility];  Service: Endoscopy;  Laterality: N/A;   ESOPHAGOGASTRODUODENOSCOPY (EGD) WITH PROPOFOL N/A 11/07/2017   Procedure: ESOPHAGOGASTRODUODENOSCOPY (EGD) WITH PROPOFOL;  Surgeon: Milus Banister, MD;  [facility];  Service: Endoscopy;  Laterality: N/A;   ESOPHAGOGASTRODUODENOSCOPY (EGD) WITH PROPOFOL N/A 05/29/2020   Procedure: ESOPHAGOGASTRODUODENOSCOPY (EGD) WITH PROPOFOL;  Surgeon: Doran Stabler, MD;  Location: [place];  Service: Gastroenterology;  Laterality: N/A;   ESOPHAGOGASTRODUODENOSCOPY (EGD) WITH PROPOFOL N/A 10/26/2021   Procedure: ESOPHAGOGASTRODUODENOSCOPY (EGD) WITH PROPOFOL;  Surgeon: Sharyn Creamer, MD;  Location: [place];  Service: Gastroenterology;  Laterality: N/A;   GIVENS CAPSULE STUDY N/A 05/19/2020   Procedure: GIVENS CAPSULE STUDY;  Surgeon: [name],  MD;  Location: [place];  Service: Endoscopy;  Laterality: N/A;  .adm for obs since pacemaker, PA wil enter order and see pt   GIVENS CAPSULE STUDY N/A 05/29/2020   Procedure: GIVENS CAPSULE STUDY;  Surgeon: Doran Stabler, MD;  Location: [place];  Service: Gastroenterology;  Laterality: N/A;   HEMATOMA EVACUATION Left 09/24/2020   Procedure: EVACUATION HEMATOMA ARM;  Surgeon: Rosetta Posner, MD;  Location: [facility] OR;  Service: Vascular;  Laterality: Left;   HOT HEMOSTASIS N/A 10/26/2017   Procedure: HOT HEMOSTASIS (ARGON PLASMA COAGULATION/BICAP);  Surgeon: Ladene Artist, MD;  Location: [facility];  Service: Endoscopy;  Laterality: N/A;   HOT HEMOSTASIS N/A 03/02/2020   Procedure: HOT HEMOSTASIS (ARGON PLASMA COAGULATION/BICAP);  Surgeon: Yetta Flock, MD;  Location: [facility] ENDOSCOPY;  Service: Gastroenterology;  Laterality: N/A;   HOT HEMOSTASIS N/A 05/31/2020   Procedure: HOT HEMOSTASIS (ARGON PLASMA COAGULATION/BICAP);  Surgeon: Irene Shipper, MD;  Location: [facility] ENDOSCOPY;  Service: Endoscopy;  Laterality: N/A;   INSERT / REPLACE / REMOVE PACEMAKER  ?2008   IR PERC TUN PERIT CATH WO PORT S&I /IMAG  07/05/2020   PERIPHERAL VASCULAR INTERVENTION Left 03/28/2021   Procedure: PERIPHERAL VASCULAR INTERVENTION;  Surgeon: Waynetta Sandy, MD;  Location: [place] [facility];  Service: Cardiovascular;  Laterality: Left;   POLYPECTOMY  11/08/2017   Procedure: POLYPECTOMY;  Surgeon: Milus Banister, MD;  [facility];  Service: Endoscopy;;   PROCTOSCOPY N/A 05/22/2018   Procedure: RIGID PROCTOSCOPY;  Surgeon: Michael Boston, MD;  [facility];  Service: General;  Laterality: N/A;   RIGHT HEART CATH N/A 02/13/2018   Procedure: RIGHT HEART CATH;  Surgeon: [name], MD;  Location: [place] [facility];  Service: Cardiovascular;  Laterality: N/A;   RIGHT HEART CATH N/A 06/23/2020   Procedure: RIGHT HEART CATH;  Surgeon: [name], MD;  Location: [place] [facility];  Service: Cardiovascular;  Laterality: N/A;   RIGHT/LEFT HEART CATH AND CORONARY ANGIOGRAPHY N/A 12/11/2019   Procedure: RIGHT/LEFT HEART CATH AND CORONARY ANGIOGRAPHY;  Surgeon: [name], MD;  Location: [place] [facility];  Service: Cardiovascular;  Laterality: N/A;   SUBMUCOSAL TATTOO INJECTION  03/02/2020   Procedure: SUBMUCOSAL TATTOO INJECTION;  Surgeon: Yetta Flock, MD;  Location: [facility] ENDOSCOPY;  Service: Gastroenterology;;   SUBMUCOSAL TATTOO INJECTION  05/31/2020   Procedure: SUBMUCOSAL TATTOO INJECTION;  Surgeon: Irene Shipper, MD;  Location: [place];  Service: Endoscopy;;   TEE WITHOUT CARDIOVERSION N/A 08/02/2021   Procedure: TRANSESOPHAGEAL ECHOCARDIOGRAM (TEE);  Surgeon: [name], MD;  Location: [facility] ENDOSCOPY;  Service: Cardiovascular;  Laterality: N/A;   TUBAL LIGATION     XI ROBOTIC ASSISTED LOWER ANTERIOR RESECTION N/A 05/22/2018  Procedure: XI ROBOTIC ASSISTED SIGMOID COLOECTOMY MOBILIZATION OF SPLENIC FLEXURE, FIREFLY ASSESSMENT OF PERFUSION;  Surgeon: Michael Boston, MD;  Location: [facility];  Service: General;  Laterality: N/A;  ERAS PATHWAY    Current Outpatient Medications  Medication Sig Dispense Refill   amiodarone (PACERONE) 200 MG tablet Take 1 tablet (200 mg total) by mouth daily. 90 tablet 3   apixaban (ELIQUIS) 2.5 MG TABS tablet Take 1 tablet (2.5 mg total) by mouth 2 (two) times daily. 60 tablet 0   diazepam (VALIUM) 5 MG tablet 1 tab po bid prn anxiety 10 tablet 0   gabapentin (NEURONTIN) 300 MG capsule Take 1 capsule (300 mg total) by mouth daily. 180 capsule 2   levothyroxine (SYNTHROID) 137 MCG tablet Take 1 tablet (137 mcg total) by mouth daily before breakfast. 90 tablet 3   lidocaine-prilocaine (EMLA) cream 1 application. 3 (three) times a week.     nitroGLYCERIN (NITROSTAT) 0.4 MG SL tablet Place 1 tablet (0.4 mg total) under the tongue every 5 (five) minutes as needed for chest pain. 25 tablet 0   pantoprazole  (PROTONIX) 40 MG tablet Take 1 tablet (40 mg total) by mouth 2 (two) times daily before a meal. 30 tablet 2   traZODone (DESYREL) 50 MG tablet Take 0.5-1 tablets (25-50 mg total) by mouth at bedtime as needed for sleep. 30 tablet 3   No current facility-administered medications for this visit.    Allergies as of 12/08/2021   (No Known Allergies)    Family History  Problem Relation Age of Onset   Asthma Father    Heart attack Father    Asthma Sister    Lung cancer Sister    Heart attack Mother    Stroke Brother    Colon cancer Neg Hx    Esophageal cancer Neg Hx    Pancreatic cancer Neg Hx    Stomach cancer Neg Hx    Liver disease Neg Hx     Social History   Socioeconomic History   Marital status: Divorced    Spouse name: Not on file   Number of children: 1   Years of education: 12   Highest education level: Not on file  Occupational History   Not on file  Tobacco Use   Smoking status: Never   Smokeless tobacco: Never  Vaping Use   Vaping Use: Never used  Substance and Sexual Activity   Alcohol use: Not Currently    Comment: 01/03/2018 "couple drinks/month"   Drug use: Not Currently    Types: Marijuana    Comment: VERY INTERMITTENT    Sexual activity: Not Currently  Other Topics Concern   Not on file  Social History Narrative   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      Home Situation: lives alone      Spiritual Beliefs: Christian - Baptist      Lifestyle: no regular exercise; poor diet      She is divorced at least one adult child   Never smoker    rare alcohol to occasional at best    no substance abuse         Social Determinants of Health   Financial Resource Strain: Low Risk  (07/15/2021)   Overall Financial Resource Strain (CARDIA)    Difficulty of Paying Living Expenses: Not hard at all  Food Insecurity: No Food Insecurity (07/15/2021)   Hunger Vital Sign    Worried About Running Out of Food in the Last Year: Never true  Ran Out of Food  in the Last Year: Never true  Transportation Needs: No Transportation Needs (07/15/2021)   PRAPARE - Hydrologist (Medical): No    Lack of Transportation (Non-Medical): No  Physical Activity: Not on file  Stress: No Stress Concern Present (07/15/2021)   [place]    Feeling of Stress : Not at all  Social Connections: Moderately Isolated (07/15/2021)   Social Connection and Isolation Panel [NHANES]    Frequency of Communication with Friends and Family: More than three times a week    Frequency of Social Gatherings with Friends and Family: Three times a week    Attends Religious Services: More than 4 times per year    Active Member of Clubs or Organizations: No    Attends Archivist Meetings: Never    Marital Status: Divorced  Human resources officer Violence: Not At Risk (07/15/2021)   Humiliation, Afraid, Rape, and Kick questionnaire    Fear of Current or Ex-Partner: No    Emotionally Abused: No    Physically Abused: No    Sexually Abused: No    Review of Systems:    Constitutional: No weight loss, fever or chills Cardiovascular: No chest pain   Respiratory: No SOB  Gastrointestinal: See HPI and otherwise negative   Physical Exam:  Vital signs: BP 118/72   Pulse 76   Ht '4\' 11"'$  (1.499 m)   Wt 120 lb (54.4 kg)   BMI 24.24 kg/m   Constitutional:   Pleasant AA female appears to be in NAD, Well developed, Well nourished, alert and cooperative Respiratory: Respirations even and unlabored. Lungs clear to auscultation bilaterally.   No wheezes, crackles, or rhonchi.  Cardiovascular: Normal S1, S2. No MRG. Regular rate and rhythm. No peripheral edema, cyanosis or pallor.  Gastrointestinal:  Soft, nondistended, nontender. No rebound or guarding. Normal bowel sounds. No appreciable masses or hepatomegaly. Rectal:  Not performed.  Psychiatric: Oriented to person, place and time. Demonstrates  good judgement and reason without abnormal affect or behaviors.  RELEVANT LABS AND IMAGING: CBC    Component Value Date/Time   WBC 4.8 12/06/2021 1157   RBC 3.88 12/06/2021 1157   HGB 11.6 (L) 12/06/2021 1157   HGB 11.0 (L) 06/19/2019 1010   HGB 8.4 (L) 10/04/2017 1534   HCT 35.4 (L) 12/06/2021 1157   HCT 26.1 (L) 10/04/2017 1534   PLT 105 (L) 12/06/2021 1157   PLT 192 06/19/2019 1010   PLT 201 10/04/2017 1534   MCV 91.2 12/06/2021 1157   MCV 87 10/04/2017 1534   MCH 29.9 12/06/2021 1157   MCHC 32.8 12/06/2021 1157   RDW 18.2 (H) 12/06/2021 1157   RDW 17.3 (H) 10/04/2017 1534   LYMPHSABS 1.0 11/10/2021 1151   LYMPHSABS 2.1 10/04/2017 1534   MONOABS 0.5 11/10/2021 1151   EOSABS 0.1 11/10/2021 1151   EOSABS 0.2 10/04/2017 1534   BASOSABS 0.0 11/10/2021 1151   BASOSABS 0.0 10/04/2017 1534    CMP     Component Value Date/Time   NA 135 11/17/2021 1033   NA 140 12/25/2018 1514   K 4.4 11/17/2021 1033   CL 91 (L) 11/17/2021 1033   CO2 34 (H) 11/17/2021 1033   GLUCOSE 76 11/17/2021 1033   BUN 13 11/17/2021 1033   BUN 33 (H) 12/25/2018 1514   CREATININE 4.55 (HH) 11/17/2021 1033   CREATININE 2.24 (H) 02/04/2020 1416   CALCIUM 9.3 11/17/2021 1033   PROT  7.7 11/17/2021 1033   PROT 8.3 07/19/2017 1503   ALBUMIN 4.5 11/17/2021 1033   ALBUMIN 4.9 (H) 07/19/2017 1503   AST 15 11/17/2021 1033   AST 14 (L) 11/23/2017 1132   ALT 6 11/17/2021 1033   ALT 9 11/23/2017 1132   ALKPHOS 78 11/17/2021 1033   BILITOT 0.8 11/17/2021 1033   BILITOT 0.3 11/23/2017 1132   GFRNONAA 9 (L) 10/27/2021 0328   GFRNONAA 21 (L) 02/04/2020 1416   GFRAA 25 (L) 02/04/2020 1416    Assessment: 1.  History of GI bleed: Recent hospitalization for GI bleed thought diverticular after EGD and colonoscopy completed 2.  History of hep C cirrhosis: Status post treatment, follows at [facility]  Plan: 1.  Patient has had no further bleeding and is doing well.  Hemoglobin is increasing  slowly. 2.  Reminded the patient she will stay on Pantoprazole 40 mg twice daily for a total of 8 weeks and then decrease to once daily. 3.  Patient to follow with [name] as needed.  [name], PA-C [facility] 12/08/2021, 10:27 AM  Cc: Mackie Pai, PA-C

## 2021-12-21 ENCOUNTER — Other Ambulatory Visit (HOSPITAL_COMMUNITY): Payer: Self-pay | Admitting: Cardiology

## 2021-12-21 NOTE — Progress Notes (Signed)
Remote ICD transmission.   

## 2021-12-22 ENCOUNTER — Ambulatory Visit (HOSPITAL_COMMUNITY)
Admission: RE | Admit: 2021-12-22 | Discharge: 2021-12-22 | Disposition: A | Discharge status: Home / Self Care | Payer: Medicare Other | Source: Ambulatory Visit | Attending: Cardiology | Admitting: Cardiology

## 2021-12-22 DIAGNOSIS — I351 Nonrheumatic aortic (valve) insufficiency: Secondary | ICD-10-CM | POA: Diagnosis not present

## 2021-12-22 DIAGNOSIS — Z950 Presence of cardiac pacemaker: Secondary | ICD-10-CM | POA: Diagnosis not present

## 2021-12-22 LAB — ECHOCARDIOGRAM COMPLETE
Calc EF: 50.3 %
MV M vel: 4.7 m/s
MV Peak grad: 88.4 mmHg
P 1/2 time: 298 msec
S' Lateral: 2.7 cm
Single Plane A2C EF: 48.9 %
Single Plane A4C EF: 50.6 %

## 2021-12-22 NOTE — Progress Notes (Signed)
  Echocardiogram 2D Echocardiogram has been performed.  Morgan Clay 12/22/2021, 4:09 PM

## 2021-12-23 ENCOUNTER — Other Ambulatory Visit: Payer: Self-pay | Admitting: Cardiology

## 2021-12-23 ENCOUNTER — Telehealth (HOSPITAL_COMMUNITY): Payer: Self-pay | Admitting: *Deleted

## 2021-12-23 NOTE — Telephone Encounter (Signed)
Called patient per Dr. [name] with echo results. EF (heart function) normal at 50 - 55%; no changes. Pt verbalized understanding of same.  Zada Girt RN

## 2021-12-27 ENCOUNTER — Telehealth: Payer: Self-pay

## 2021-12-27 NOTE — Progress Notes (Addendum)
[place] at [facility] [address] [place], [place] [address] [phone]  Date:  12/29/2021   Name:  Morgan Clay   DOB:  [birth year]   MRN:  [id number]  PCP:  Mackie Pai, PA-C    Chief Complaint: Tremors (EMS came out- she took a Benadryl. No Fever. None since then. She does not feel like this is related to the PNA vaccination. )   History of Present Illness:  Morgan Clay is a 76 y.o. very pleasant female patient who presents with the following:  Pt seen today with possible vaccine reaction Pt of [employer] from 8/22; ---Caller states she had shaking all over last night. State she had a pneumonia vaccine yesterday. Was shaking and conscious when it occurred and had eaten well a full meal. No further shaking today. She called EMS and they came to see her and advised to take a Benadryl. Feeling better but weak.  Today is Thursday On Monday she had her pneumonia booster.  That same night she noted shaking chills- got to be quite severe This lasted about 2 hours She did not feel cold but she was shaking She called her daughter to check on her. EMS came out- they gave her a benadryl but this did not seem to make much   This has not occurred again She is not aware of any fever She otherwise is feeling well No cough no abd pain, no diarrhea or vomiting  She is on dialysis 3x a week - no change in her routine  She is not aware of any other potential causes of her symptoms.  Currently she feels just about back to normal, except perhaps a bit "nervous" Patient Active Problem List   Diagnosis Date Noted   Acute lower GI bleeding 10/25/2021   Transient hypotension 10/25/2021   Arthritis 09/06/2021   Chronic bronchitis ([place]) 37/90/2409   Chronic systolic congestive heart failure ([place]) 09/06/2021   History of blood transfusion 09/06/2021   Lower GI bleeding 09/06/2021   Carpal tunnel syndrome,  bilateral 06/09/2021   Lesion of ulnar nerve 06/09/2021   Polyneuropathy associated with underlying disease ([place]) 06/09/2021   ESRD on dialysis ([place]) 03/22/2021   Esophageal varices without bleeding ([place]) 12/23/2020   Atrial fibrillation with RVR ([place]) 11/17/2020   Bleeding pseudoaneurysm of left brachiocephalic arteriovenous fistula ([place]) 09/24/2020   Bleeding pseudoaneurysm of left brachiocephalic AV fistula ([place]) 09/24/2020   Hepatic cirrhosis ([place]) 08/12/2020   Malnutrition of moderate degree 07/09/2020   Angiodysplasia of small intestine 06/10/2020   Acute blood loss anemia 05/27/2020   COVID-19 virus infection 05/27/2020   Anemia due to chronic blood loss 04/15/2020   Overweight (BMI 25.0-29.9) 03/02/2020   Chronic anticoagulation    Persistent atrial fibrillation ([place]) 02/06/2020   Secondary hypercoagulable state ([place]) 02/06/2020   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 (HCV) ([place])    Gout    Pain in right knee 05/13/2018   Anemia in chronic kidney disease 01/18/2018   Pulmonary hypertension ([place]) on echocardiogram 01/14/2018   AVM (arteriovenous malformation) of small bowel, acquired 01/14/2018   Glaucoma 01/03/2018   Atrial fibrillation, chronic 10/25/2017   Aortic atherosclerosis ([place]) 08/30/2017   OBESITY 08/08/2017   Diverticulitis of left colon status post robotic low anterior to sigmoid resection 05/22/2018 07/31/2017   Hypertension associated with diabetes ([place]) 06/07/2017   MDD (major depressive disorder), recurrent, in partial remission ([place]) 06/07/2017   Asthma 05/22/2017  Chronic obstructive pulmonary disease (HCC)    AICD (automatic cardioverter/defibrillator) present 10/06/2015   Atypical atrial flutter ([place]) 09/28/2015   Type 2 diabetes, controlled, with renal manifestation ([place]) 11/24/2013   CKD (chronic kidney disease) stage 4, GFR 15-29 ml/min  11/24/2013   Nonischemic cardiomyopathy ([place]) 11/22/2010   DEPRESSION 12/17/2009   Obesity  05/27/2009   OSTEOPENIA 09/30/2008   FIBROIDS, UTERUS 03/05/2008   OBSTRUCTIVE SLEEP APNEA 11/14/2007   Chronic diastolic CHF (congestive heart failure) ([place]) 04/24/2007   HYPERTENSION, BENIGN 04/24/2007   Hypothyroidism 01/28/2007   Biventricular ICD (implantable cardioverter-defibrillator) in place 01/08/2007    Past Medical History:  Diagnosis Date   Acute blood loss anemia 09/24/2020   AICD (automatic cardioverter/defibrillator) present 10/06/2015   Anemia due to chronic blood loss 04/15/2020   Anemia in chronic kidney disease 01/18/2018   Angiodysplasia of small intestine 06/10/2020   Ileum - seen on capsule endoscopy 05/2020 - ablated at colonoscopy   Aortic atherosclerosis ([place]) 08/30/2017   Arthritis    "qwhere" (01/03/2018)   Asthma    reports mild asthma since childhood - had COPD on dx list from prior PCP   Atrial fibrillation with RVR ([place]) 11/17/2020   Atrial fibrillation, chronic 10/25/2017   Atypical atrial flutter ([place]) 09/28/2015   AVM (arteriovenous malformation) of small bowel, acquired 01/14/2018   Biventricular ICD (implantable cardioverter-defibrillator) in place 01/08/2007   Qualifier: Diagnosis of  By: Hassell Done FNP, Nykedtra     Bleeding pseudoaneurysm of left brachiocephalic arteriovenous fistula ([place]) 09/24/2020   Bleeding pseudoaneurysm of left brachiocephalic AV fistula ([place]) 09/24/2020   Carpal tunnel syndrome, bilateral 06/09/2021   Chronic bronchitis ([place])    "get it most years; not this past year though" (01/03/2018)   Chronic diastolic CHF (congestive heart failure) ([place]) 04/24/2007   Annotation: secondary to nonischemic cardiomyopathy s/p CRT-D Followed by Dr. Caryl Comes in Cardiology Qualifier: Diagnosis of  By: Hassell Done FNP, Nykedtra     Chronic obstructive pulmonary disease ([place])    Chronic systolic congestive heart failure ([place])    CKD (chronic kidney disease) stage 4, GFR 15-29 ml/min  11/24/2013   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  (HCV) ([place])    HEPATITIS C - s/p treatment with Harvoni, saw hepatology, Dawn Drazek   COVID-19 virus infection 05/27/2020   Diabetes mellitus without complication ([place])    DIET CONTROLLED    Dialysis complication    Diverticulitis of left colon status post robotic low anterior to sigmoid resection 05/22/2018 07/31/2017   End stage renal disease ([place]) 03/22/2021   FIBROIDS, UTERUS 03/05/2008   Glaucoma    Gout    History of blood transfusion ~ 11/2017   Hypertension associated with diabetes ([place]) 06/07/2017   Hypothyroidism    LBBB (left bundle branch block)    Lesion of ulnar nerve 06/09/2021   Long term current use of anticoagulant    Lower GI bleeding    "been dealing w/it since 07/2017" (01/03/2018)   Malnutrition of moderate degree 07/09/2020   MDD (major depressive disorder), recurrent, in partial remission ([place]) 06/07/2017   Nonischemic cardiomyopathy ([place])    Obesity 05/27/2009   Qualifier: Diagnosis of  By: Hassell Done FNP, Nykedtra     OBSTRUCTIVE SLEEP APNEA 11/14/2007   no CPAP   OSTEOPENIA 09/30/2008   Other cirrhosis of liver ([place]) 08/12/2020   Formatting of this note is different from the original. Patient with history of cirrhosis secondary to hepatitis C with no history of hepatic decompensation.  She has had some hyponatremia  and hypoalbuminemia in the past however most recent labs show relatively well-preserved liver synthetic function.  She will have labs to update liver synthetic function.  Hepatoma screening - patient is currentl   Overweight (BMI 25.0-29.9) 03/02/2020   Pain in right knee 05/13/2018   Persistent atrial fibrillation ([place]) 02/06/2020   Pneumonia    "several times" (01/03/2018)   Polyneuropathy associated with underlying disease ([place]) 06/09/2021   Pulmonary hypertension ([place]) on echocardiogram 01/14/2018   Secondary esophageal varices without bleeding ([place]) 12/23/2020   Formatting of this note might be different from the original. Patient had trace  esophageal varices during small bowel enteroscopy in October 2021, follow-up EGD in January 2022 with no varices.  She previously had GI bleeding thought to be from AVM versus GAVE.  She is no longer on carvedilol and will need surveillance endoscopy.  Patient was previously followed at [facility] and I have asked her    Secondary hypercoagulable state ([place]) 02/06/2020   Type 2 diabetes, controlled, with renal manifestation ([place]) 11/24/2013    Past Surgical History:  Procedure Laterality Date   A/V FISTULAGRAM N/A 03/28/2021   Procedure: A/V FISTULAGRAM;  Surgeon: Waynetta Sandy, MD;  Location: [facility];  Service: Cardiovascular;  Laterality: N/A;   APPENDECTOMY     AV FISTULA PLACEMENT Left 08/05/2020   Procedure: LEFT UPPER EXTREMITY ARTERIOVENOUS (AV) FISTULA CREATION;  Surgeon: Cherre Robins, MD;  Location: [facility];  Service: Vascular;  Laterality: Left;   [place] Left 09/16/2020   Procedure: LEFT SECOND STAGE [place];  Surgeon: Cherre Robins, MD;  Location: [facility];  Service: Vascular;  Laterality: Left;  PERIPHERAL NERVE BLOCK   CARDIAC CATHETERIZATION     CARDIOVERSION N/A 12/14/2014   Procedure: CARDIOVERSION;  Surgeon: Dorothy Spark, MD;  Location: [facility];  Service: Cardiovascular;  Laterality: N/A;   CARDIOVERSION N/A 02/26/2017   Procedure: CARDIOVERSION;  Surgeon: Evans Lance, MD;  Location: [facility];  Service: Cardiovascular;  Laterality: N/A;   CARDIOVERSION N/A 07/24/2017   Procedure: CARDIOVERSION;  Surgeon: Acie Fredrickson Wonda Cheng, MD;  Location: [facility] ENDOSCOPY;  Service: Cardiovascular;  Laterality: N/A;   CARDIOVERSION N/A 02/12/2020   Procedure: CARDIOVERSION;  Surgeon: [name], MD;  Location: [facility] ENDOSCOPY;  Service: Cardiovascular;  Laterality: N/A;   CARDIOVERSION N/A 09/15/2021   Procedure: CARDIOVERSION;  Surgeon: [name], MD;  Location: [facility] ENDOSCOPY;  Service:  Cardiovascular;  Laterality: N/A;   CARPAL TUNNEL RELEASE  07/21/2021   COLONOSCOPY N/A 11/08/2017   Procedure: COLONOSCOPY;  Surgeon: Milus Banister, MD;  Location: [facility] ENDOSCOPY;  Service: Endoscopy;  Laterality: N/A;   COLONOSCOPY WITH PROPOFOL N/A 05/31/2020   Procedure: COLONOSCOPY WITH PROPOFOL;  Surgeon: Irene Shipper, MD;  Location: [facility] ENDOSCOPY;  Service: Endoscopy;  Laterality: N/A;   COLONOSCOPY WITH PROPOFOL N/A 10/26/2021   Procedure: COLONOSCOPY WITH PROPOFOL;  Surgeon: Sharyn Creamer, MD;  Location: [facility];  Service: Gastroenterology;  Laterality: N/A;   DILATION AND CURETTAGE OF UTERUS     ENTEROSCOPY N/A 03/02/2020   Procedure: ENTEROSCOPY;  Surgeon: Yetta Flock, MD;  Location: [facility] ENDOSCOPY;  Service: Gastroenterology;  Laterality: N/A;   EP IMPLANTABLE DEVICE N/A 10/06/2015   Procedure: BIV ICD Generator Changeout;  Surgeon: [name], MD;  Location: [facility];  Service: Cardiovascular;  Laterality: N/A;   ESOPHAGOGASTRODUODENOSCOPY N/A 10/26/2017   Procedure: ESOPHAGOGASTRODUODENOSCOPY (EGD);  Surgeon: Ladene Artist, MD;  [facility]: [facility] ENDOSCOPY;  Service: Endoscopy;  Laterality: N/A;   ESOPHAGOGASTRODUODENOSCOPY (EGD) WITH PROPOFOL N/A 11/07/2017   Procedure: ESOPHAGOGASTRODUODENOSCOPY (EGD) WITH PROPOFOL;  Surgeon: Milus Banister, MD;  [facility] ENDOS[facility];  Service: Endoscopy;  Laterality: N/A;   ESOPHAGOGASTRODUODENOSCOPY (EGD) WITH PROPOFOL N/A 05/29/2020   Procedure: ESOPHAGOGASTRODUODENOSCOPY (EGD) WITH PROPOFOL;  Surgeon: Doran Stabler, MD;  Location: [facility];  Service: Gastroenterology;  Laterality: N/A;   ESOPHAGOGASTRODUODENOSCOPY (EGD) WITH PROPOFOL N/A 10/26/2021   Procedure: ESOPHAGOGASTRODUODENOSCOPY (EGD) WITH PROPOFOL;  Surgeon: Sharyn Creamer, MD;  Location: [facility];  Service: Gastroenterology;  Laterality: N/A;   GIVENS CAPSULE STUDY N/A 05/19/2020   Procedure: GIVENS CAPSULE STUDY;  Surgeon: [name],  MD;  Location: [facility];  Service: Endoscopy;  Laterality: N/A;  .adm for obs since pacemaker, PA wil enter order and see pt   GIVENS CAPSULE STUDY N/A 05/29/2020   Procedure: GIVENS CAPSULE STUDY;  Surgeon: Doran Stabler, MD;  Location: [facility];  Service: Gastroenterology;  Laterality: N/A;   HEMATOMA EVACUATION Left 09/24/2020   Procedure: EVACUATION HEMATOMA ARM;  Surgeon: Rosetta Posner, MD;  Location: [facility] OR;  Service: Vascular;  Laterality: Left;   HOT HEMOSTASIS N/A 10/26/2017   Procedure: HOT HEMOSTASIS (ARGON PLASMA COAGULATION/BICAP);  Surgeon: Ladene Artist, MD;  Location: [facility];  Service: Endoscopy;  Laterality: N/A;   HOT HEMOSTASIS N/A 03/02/2020   Procedure: HOT HEMOSTASIS (ARGON PLASMA COAGULATION/BICAP);  Surgeon: Yetta Flock, MD;  Location: [facility] ENDOSCOPY;  Service: Gastroenterology;  Laterality: N/A;   HOT HEMOSTASIS N/A 05/31/2020   Procedure: HOT HEMOSTASIS (ARGON PLASMA COAGULATION/BICAP);  Surgeon: Irene Shipper, MD;  Location: [facility] ENDOSCOPY;  Service: Endoscopy;  Laterality: N/A;   INSERT / REPLACE / REMOVE PACEMAKER  ?2008   IR PERC TUN PERIT CATH WO PORT S&I /IMAG  07/05/2020   PERIPHERAL VASCULAR INTERVENTION Left 03/28/2021   Procedure: PERIPHERAL VASCULAR INTERVENTION;  Surgeon: Waynetta Sandy, MD;  Location: [facility];  Service: Cardiovascular;  Laterality: Left;   POLYPECTOMY  11/08/2017   Procedure: POLYPECTOMY;  Surgeon: Milus Banister, MD;  [facility] ENDOSCOPY;  Service: Endoscopy;;   PROCTOSCOPY N/A 05/22/2018   Procedure: RIGID PROCTOSCOPY;  Surgeon: Michael Boston, MD;  Location: [facility] ORS;  Service: General;  Laterality: N/A;   RIGHT HEART CATH N/A 02/13/2018   Procedure: RIGHT HEART CATH;  Surgeon: [name], MD;  Location: [facility];  Service: Cardiovascular;  Laterality: N/A;   RIGHT HEART CATH N/A 06/23/2020   Procedure: RIGHT HEART CATH;  Surgeon: [name], MD;  Location: [facility];  Service: Cardiovascular;  Laterality: N/A;   RIGHT/LEFT HEART CATH AND CORONARY ANGIOGRAPHY N/A 12/11/2019   Procedure: RIGHT/LEFT HEART CATH AND CORONARY ANGIOGRAPHY;  Surgeon: [name], MD;  Location: [facility];  Service: Cardiovascular;  Laterality: N/A;   SUBMUCOSAL TATTOO INJECTION  03/02/2020   Procedure: SUBMUCOSAL TATTOO INJECTION;  Surgeon: Yetta Flock, MD;  Location[facility] ENDOSCOPY;  Service: Gastroenterology;;   SUBMUCOSAL TATTOO INJECTION  05/31/2020   Procedure: SUBMUCOSAL TATTOO INJECTION;  Surgeon: Irene Shipper, MD;  Location: [facility];  Service: Endoscopy;;   TEE WITHOUT CARDIOVERSION N/A 08/02/2021   Procedure: TRANSESOPHAGEAL ECHOCARDIOGRAM (TEE);  Surgeon: [name], MD;  Location: [facility] ENDOSCOPY;  Service: Cardiovascular;  Laterality: N/A;   TUBAL LIGATION     XI ROBOTIC ASSISTED LOWER ANTERIOR RESECTION N/A 05/22/2018   Procedure: XI ROBOTIC ASSISTED SIGMOID COLOECTOMY MOBILIZATION OF SPLENIC FLEXURE,  FIREFLY ASSESSMENT OF PERFUSION;  Surgeon: Michael Boston, MD;  Location: [facility];  Service: General;  Laterality: N/A;  ERAS PATHWAY    Social History   Tobacco Use   Smoking status: Never   Smokeless tobacco: Never  Vaping Use   Vaping Use: Never used  Substance Use Topics   Alcohol use: Not Currently    Comment: 01/03/2018 "couple drinks/month"   Drug use: Not Currently    Types: Marijuana    Comment: VERY INTERMITTENT     Family History  Problem Relation Age of Onset   Asthma Father    Heart attack Father    Asthma Sister    Lung cancer Sister    Heart attack Mother    Stroke Brother    Colon cancer Neg Hx    Esophageal cancer Neg Hx    Pancreatic cancer Neg Hx    Stomach cancer Neg Hx    Liver disease Neg Hx     No Known Allergies  Medication list has been reviewed and updated.  Current Outpatient Medications on File Prior to Visit  Medication Sig Dispense Refill   amiodarone (PACERONE) 200 MG  tablet Take 1 tablet (200 mg total) by mouth daily. 90 tablet 3   diazepam (VALIUM) 5 MG tablet 1 tab po bid prn anxiety 10 tablet 0   ELIQUIS 2.5 MG TABS tablet Take 1 tablet by mouth twice daily 60 tablet 11   gabapentin (NEURONTIN) 300 MG capsule Take 1 capsule (300 mg total) by mouth daily. 180 capsule 2   levothyroxine (SYNTHROID) 137 MCG tablet Take 1 tablet (137 mcg total) by mouth daily before breakfast. 90 tablet 3   lidocaine-prilocaine (EMLA) cream 1 application. 3 (three) times a week.     nitroGLYCERIN (NITROSTAT) 0.4 MG SL tablet Place 1 tablet (0.4 mg total) under the tongue every 5 (five) minutes as needed for chest pain. 25 tablet 0   pantoprazole (PROTONIX) 40 MG tablet Take 1 tablet (40 mg total) by mouth 2 (two) times daily before a meal. 30 tablet 2   traZODone (DESYREL) 50 MG tablet Take 0.5-1 tablets (25-50 mg total) by mouth at bedtime as needed for sleep. 30 tablet 3   No current facility-administered medications on file prior to visit.    Review of Systems:  As per HPI- otherwise negative.   Physical Examination: Vitals:   12/29/21 1145  BP: 124/60  Pulse: 66  Resp: 18  Temp: 97.6 F (36.4 C)  SpO2: 99%   Vitals:   12/29/21 1145  Weight: 120 lb 12.8 oz (54.8 kg)  Height: '4\' 11"'$  (1.499 m)   Body mass index is 24.4 kg/m. Ideal Body Weight: Weight in (lb) to have BMI = 25: 123.5  GEN: no acute distress.  Older lady, petite build, generally looks well HEENT: Atraumatic, Normocephalic.  Bilateral TM wnl, oropharynx normal.  PEERL,EOMI.   Ears and Nose: No external deformity. CV: RRR, No M/G/R. No JVD. No thrill. No extra heart sounds. PULM: CTA B, no wheezes, crackles, rhonchi. No retractions. No resp. distress. No accessory muscle use. ABD: S, NT, ND, +BS. No rebound. No HSM.  Benign belly EXTR: No c/c/e PSYCH: Normally interactive. Conversant.  Fine bilateral hand tremor is present   Assessment and Plan: Shaking chills - Plan: CBC, Comprehensive  metabolic panel, TSH, DG Chest 2 View, Urine Culture, CANCELED: Urine Culture Seen today with concern of chills, occurred after an pneumonia vaccine.  Suspect symptoms may have been due to been vaccine-vigorous immune  reaction In any case she is currently feeling much better.  Will obtain blood work, chest x-ray, urine culture as above.  I have asked her to please let me know if symptoms should recur or any other concerns  Signed Lamar Blinks, MD  Received her chest film as below, message to patient  DG Chest 2 View  Result Date: 12/29/2021 CLINICAL DATA:  Chills and shaking episodes. Evaluate for pneumonia. EXAM: CHEST - 2 VIEW COMPARISON:  Radiographs 11/17/2021 and 11/10/2021.  CT 11/04/2005. FINDINGS: Left subclavian AICD leads appear unchanged, projecting over the right atrium, right ventricle and coronary sinus. Stable cardiomegaly and generalized interstitial prominence. No confluent airspace opacity, pneumothorax or significant pleural effusion. The bones appear unchanged. IMPRESSION: Similar diffuse interstitial pulmonary prominence to recent prior studies, remaining most consistent with chronic congestive heart failure. No evidence of pneumonia. Electronically Signed   By: Richardean Sale M.D.   On: 12/29/2021 13:07    Received labs as below, 8/25.  Message to patient  Results for orders placed or performed in visit on 12/29/21  CBC  Result Value Ref Range   WBC 5.1 4.0 - 10.5 K/uL   RBC 4.57 3.87 - 5.11 Mil/uL   Platelets 143.0 (L) 150.0 - 400.0 K/uL   Hemoglobin 13.3 12.0 - 15.0 g/dL   HCT 41.2 36.0 - 46.0 %   MCV 90.1 78.0 - 100.0 fl   MCHC 32.2 30.0 - 36.0 g/dL   RDW 16.8 (H) 11.5 - 15.5 %  Comprehensive metabolic panel  Result Value Ref Range   Sodium 137 135 - 145 mEq/L   Potassium 4.5 3.5 - 5.1 mEq/L   Chloride 91 (L) 96 - 112 mEq/L   CO2 31 19 - 32 mEq/L   Glucose, Bld 115 (H) 70 - 99 mg/dL   BUN 23 6 - 23 mg/dL   Creatinine, Ser 5.65 (HH) 0.40 - 1.20 mg/dL    Total Bilirubin 0.7 0.2 - 1.2 mg/dL   Alkaline Phosphatase 107 39 - 117 U/L   AST 20 0 - 37 U/L   ALT 13 0 - 35 U/L   Total Protein 8.5 (H) 6.0 - 8.3 g/dL   Albumin 4.5 3.5 - 5.2 g/dL   GFR 6.98 (LL) >60.00 mL/min   Calcium 8.9 8.4 - 10.5 mg/dL  TSH  Result Value Ref Range   TSH 11.33 (H) 0.35 - 5.50 uIU/mL   *Note: Due to a large number of results and/or encounters for the requested time period, some results have not been displayed. A complete set of results can be found in Results Review.   Of note, patient is on dialysis

## 2021-12-27 NOTE — Telephone Encounter (Signed)
Nurse Assessment Nurse: Raenette Rover, RN, Zella Ball Date/Time Morgan Clay Time): 12/27/2021 9:04:48 AM Confirm and document reason for call. If symptomatic, describe symptoms. ---Caller states she had shaking all over last night. State she had a pneumonia vaccine yesterday. Was shaking and conscious when it occurred and had eaten well a full meal. No further shaking today. She called EMS and they came to see her and advised to take a Benadryl. Feeling better but weak. Does the patient have any new or worsening symptoms? ---Yes Will a triage be completed? ---Yes Related visit to physician within the last 2 weeks? ---No Does the PT have any chronic conditions? (i.e. diabetes, asthma, this includes High risk factors for pregnancy, etc.) ---Yes List chronic conditions. ---CAD, on Eliquis, thyroidism, Is this a behavioral health or substance abuse call? ---No Guidelines Guideline Title Affirmed Question Affirmed Notes Nurse Date/Time (Eastern Time) Immunization Reactions Pneumococcal vaccine reactions Raenette Rover, RN, Zella Ball 12/27/2021 9:08:11 AM Muscle Jerks - Tics - Shudders Shuddering (trembling) occurs without an obvious cause Raenette Rover, RN, Zella Ball 12/27/2021 9:14:33 AM PLEASE NOTE: All timestamps contained within this report are represented as [place] Standard Time. CONFIDENTIALTY NOTICE: This fax transmission is intended only for the addressee. It contains information that is legally privileged, confidential or otherwise protected from use or disclosure. If you are not the intended recipient, you are strictly prohibited from reviewing, disclosing, copying using or disseminating any of this information or taking any action in reliance on or regarding this information. If you have received this fax in error, please notify us immediately by telephone so that we can arrange for its return to [name]. Phone: [phone], Toll-Free: [phone], Fax: [phone] Page: 2 of 2 Call Id: [id number] Morgan Clay.  Time Morgan Clay Time) Disposition Final User 12/27/2021 9:13:15 Chapin, RN, Zella Ball 12/27/2021 9:18:23 AM SEE PCP WITHIN 3 DAYS Yes Raenette Rover, RN, Zella Ball Final Disposition 12/27/2021 9:18:23 AM SEE PCP WITHIN 3 DAYS Yes Raenette Rover, RN, Herbert Deaner Disagree/Comply Comply Caller Understands Yes PreDisposition Call Doctor Care Advice Given Per Guideline * Mild pain, tenderness, swelling, or redness at the injection site (in 50%). * Fever, muscle aches lasting for 1 to 2 days (in 1%). * Anaphylactic reaction (acute severe allergic reaction with wheezing, urticaria, shock). HEAT PACK FOR LOCAL REACTION AT VACCINE SITE: * Reason: Improve blood flow to the area. Reduce the pain and swelling. * Caution: Burn. Do not sleep on a heating pad. CALL BACK IF: * Fever lasts over 3 days * Pain lasts over 3 days * Redness or swelling lasts over 3 days * You become worse CARE ADVICE given per Immunization Reactions (Adult) guideline. SEE PCP WITHIN 3 DAYS: CALL BACK IF: * You develop other worrisome symptoms * You become worse CARE ADVICE given per Muscle Jerks - Tics - Shudders (Adult) guideline. Comments User: Wilson Singer, RN Date/Time Morgan Clay Time): 12/27/2021 9:09:09 AM Gets diaylsis and had Dialysis yesterday. User: Wilson Singer, RN Date/Time Morgan Clay Time): 12/27/2021 9:14:48 AM Goes for dailysis mon, wed, friday. User: Wilson Singer, RN Date/Time Morgan Clay Time): 12/27/2021 9:16:02 AM Received dialysis at 11:30 and vaccine at 1:30 pm shaking began at 8:30 last night and lasted for about 2 hours. User: Wilson Singer, RN Date/Time Morgan Clay Time): 12/27/2021 9:21:44 AM Upgraded to be seen within the next 24 hours. Disposition not available to choose from. Due to severity and caller is a dialysis patient. User: Wilson Singer, RN Date/Time Morgan Clay Time): 12/27/2021 9:22:43 AM Caller states there was a virtual appt available tm at 11:30. Transferred back to mail line  to schedule that appt or  in person visit. Referrals REFERRED TO PCP OFFICE

## 2021-12-27 NOTE — Telephone Encounter (Signed)
Appt scheduled 12/29/21.

## 2021-12-28 ENCOUNTER — Encounter: Payer: Self-pay | Admitting: Oncology

## 2021-12-29 ENCOUNTER — Ambulatory Visit (HOSPITAL_BASED_OUTPATIENT_CLINIC_OR_DEPARTMENT_OTHER)
Admission: RE | Admit: 2021-12-29 | Discharge: 2021-12-29 | Disposition: A | Discharge status: Home / Self Care | Payer: Medicare Other | Source: Ambulatory Visit | Attending: Family Medicine | Admitting: Family Medicine

## 2021-12-29 ENCOUNTER — Encounter: Payer: Self-pay | Admitting: Family Medicine

## 2021-12-29 ENCOUNTER — Encounter: Payer: Self-pay | Admitting: Oncology

## 2021-12-29 ENCOUNTER — Ambulatory Visit (INDEPENDENT_AMBULATORY_CARE_PROVIDER_SITE_OTHER): Payer: Medicare Other | Admitting: Family Medicine

## 2021-12-29 VITALS — BP 124/60 | HR 66 | Temp 97.6°F | Resp 18 | Ht 59.0 in | Wt 120.8 lb

## 2021-12-29 DIAGNOSIS — R6883 Chills (without fever): Secondary | ICD-10-CM | POA: Diagnosis present

## 2021-12-29 LAB — CBC
HCT: 41.2 % (ref 36.0–46.0)
Hemoglobin: 13.3 g/dL (ref 12.0–15.0)
MCHC: 32.2 g/dL (ref 30.0–36.0)
MCV: 90.1 fl (ref 78.0–100.0)
Platelets: 143 10*3/uL — ABNORMAL LOW (ref 150.0–400.0)
RBC: 4.57 Mil/uL (ref 3.87–5.11)
RDW: 16.8 % — ABNORMAL HIGH (ref 11.5–15.5)
WBC: 5.1 10*3/uL (ref 4.0–10.5)

## 2021-12-29 LAB — TSH: TSH: 11.33 u[IU]/mL — ABNORMAL HIGH (ref 0.35–5.50)

## 2021-12-29 NOTE — Patient Instructions (Signed)
Good to see you today- I will be in touch with your labs and x-ray asap Please bring in a urine sample for culture when you can  Let me know if any recurrence of your symptoms

## 2021-12-30 ENCOUNTER — Other Ambulatory Visit: Payer: Medicare Other

## 2021-12-30 ENCOUNTER — Telehealth: Payer: Self-pay | Admitting: *Deleted

## 2021-12-30 ENCOUNTER — Encounter: Payer: Self-pay | Admitting: Family Medicine

## 2021-12-30 DIAGNOSIS — R6883 Chills (without fever): Secondary | ICD-10-CM

## 2021-12-30 LAB — COMPREHENSIVE METABOLIC PANEL
ALT: 13 U/L (ref 0–35)
AST: 20 U/L (ref 0–37)
Albumin: 4.5 g/dL (ref 3.5–5.2)
Alkaline Phosphatase: 107 U/L (ref 39–117)
BUN: 23 mg/dL (ref 6–23)
CO2: 31 mEq/L (ref 19–32)
Calcium: 8.9 mg/dL (ref 8.4–10.5)
Chloride: 91 mEq/L — ABNORMAL LOW (ref 96–112)
Creatinine, Ser: 5.65 mg/dL (ref 0.40–1.20)
GFR: 6.98 mL/min — CL (ref >60.00)
Glucose, Bld: 115 mg/dL — ABNORMAL HIGH (ref 70–99)
Potassium: 4.5 mEq/L (ref 3.5–5.1)
Sodium: 137 mEq/L (ref 135–145)
Total Bilirubin: 0.7 mg/dL (ref 0.2–1.2)
Total Protein: 8.5 g/dL — ABNORMAL HIGH (ref 6.0–8.3)

## 2021-12-30 NOTE — Telephone Encounter (Signed)
CRITICAL VALUE STICKER  CRITICAL VALUE:  Creatinine -- 5.65,  GFR -- 6.98  RECEIVER (on-site recipient of call): Kelle Darting, [place] NOTIFIED: 12/30/21 @ 10:41am  MESSENGER (representative from lab): Hope  MD NOTIFIED:  [place], [place]  TIME OF NOTIFICATION: 10:43  RESPONSE:

## 2021-12-30 NOTE — Telephone Encounter (Signed)
Noted. Pt follows w nephro. Nothing urgent needs to be done for GFR.

## 2021-12-31 LAB — URINE CULTURE
MICRO NUMBER:: 13832605
SPECIMEN QUALITY:: ADEQUATE

## 2022-01-01 ENCOUNTER — Encounter: Payer: Self-pay | Admitting: Family Medicine

## 2022-01-01 NOTE — Progress Notes (Signed)
8/27, received negative urine culture as below Message to patient

## 2022-01-02 ENCOUNTER — Ambulatory Visit (INDEPENDENT_AMBULATORY_CARE_PROVIDER_SITE_OTHER): Payer: Medicare Other

## 2022-01-02 DIAGNOSIS — I5032 Chronic diastolic (congestive) heart failure: Secondary | ICD-10-CM

## 2022-01-02 LAB — CUP PACEART REMOTE DEVICE CHECK
Battery Remaining Longevity: 3 mo
Battery Voltage: 2.81 V
Brady Statistic AP VP Percent: 0.96 %
Brady Statistic AP VS Percent: 0.01 %
Brady Statistic AS VP Percent: 98.96 %
Brady Statistic AS VS Percent: 0.06 %
Brady Statistic RA Percent Paced: 0.98 %
Brady Statistic RV Percent Paced: 99.86 %
Date Time Interrogation Session: 20230828022604
HighPow Impedance: 42 Ohm
HighPow Impedance: 74 Ohm
Implantable Lead Implant Date: 20080818
Implantable Lead Implant Date: 20080818
Implantable Lead Implant Date: 20080818
Implantable Lead Location: 753858
Implantable Lead Location: 753859
Implantable Lead Location: 753860
Implantable Lead Model: 4193
Implantable Lead Model: 5076
Implantable Lead Model: 6947
Implantable Pulse Generator Implant Date: 20170531
Lead Channel Impedance Value: 247 Ohm
Lead Channel Impedance Value: 342 Ohm
Lead Channel Impedance Value: 399 Ohm
Lead Channel Impedance Value: 4047 Ohm
Lead Channel Impedance Value: 4047 Ohm
Lead Channel Impedance Value: 456 Ohm
Lead Channel Pacing Threshold Amplitude: 1 V
Lead Channel Pacing Threshold Amplitude: 1 V
Lead Channel Pacing Threshold Amplitude: 1.5 V
Lead Channel Pacing Threshold Pulse Width: 0.4 ms
Lead Channel Pacing Threshold Pulse Width: 0.4 ms
Lead Channel Pacing Threshold Pulse Width: 0.4 ms
Lead Channel Sensing Intrinsic Amplitude: 3.25 mV
Lead Channel Sensing Intrinsic Amplitude: 3.25 mV
Lead Channel Sensing Intrinsic Amplitude: 9.875 mV
Lead Channel Sensing Intrinsic Amplitude: 9.875 mV
Lead Channel Setting Pacing Amplitude: 2 V
Lead Channel Setting Pacing Amplitude: 2 V
Lead Channel Setting Pacing Amplitude: 2.5 V
Lead Channel Setting Pacing Pulse Width: 0.4 ms
Lead Channel Setting Pacing Pulse Width: 0.4 ms
Lead Channel Setting Sensing Sensitivity: 0.3 mV

## 2022-01-10 ENCOUNTER — Ambulatory Visit (INDEPENDENT_AMBULATORY_CARE_PROVIDER_SITE_OTHER): Payer: Medicare Other | Admitting: Medical

## 2022-01-10 VITALS — BP 117/50 | HR 70 | Resp 18 | Ht 59.0 in | Wt 124.0 lb

## 2022-01-10 DIAGNOSIS — N186 End stage renal disease: Secondary | ICD-10-CM

## 2022-01-10 DIAGNOSIS — E038 Other specified hypothyroidism: Secondary | ICD-10-CM | POA: Diagnosis not present

## 2022-01-10 DIAGNOSIS — R6883 Chills (without fever): Secondary | ICD-10-CM

## 2022-01-10 DIAGNOSIS — Z79899 Other long term (current) drug therapy: Secondary | ICD-10-CM

## 2022-01-10 DIAGNOSIS — F419 Anxiety disorder, unspecified: Secondary | ICD-10-CM

## 2022-01-10 DIAGNOSIS — I5032 Chronic diastolic (congestive) heart failure: Secondary | ICD-10-CM | POA: Diagnosis not present

## 2022-01-10 MED ORDER — GABAPENTIN 300 MG PO CAPS
300.0000 mg | ORAL_CAPSULE | Freq: Every day | ORAL | 2 refills | Status: DC
Start: 2022-01-10 — End: 2022-10-03

## 2022-01-10 MED ORDER — GABAPENTIN 300 MG PO CAPS: 300.0000 mg | ORAL_CAPSULE | Freq: Every day | ORAL | 2 refills | Status: DC

## 2022-01-10 NOTE — Patient Instructions (Addendum)
ESRD with chf. Currently no indicatio of chf flare. No pedal edema and no jvd. Continue on current regimen of dialysis.  Chill work up negative. But low thyroid and tsh was high. Will get tsh and t4.  For anxiety you 3 tabs of valium. Will have you do drug screen today. Then have you sign contract when screen back.  For restless leg refilled your gabapentin.  Follow up date to be determined after lab review.

## 2022-01-10 NOTE — Addendum Note (Signed)
Addended by: Anabel Halon on: 01/10/2022 03:08 PM   Modules accepted: Orders

## 2022-01-10 NOTE — Addendum Note (Signed)
Addended by: Jeronimo Greaves on: 01/10/2022 03:28 PM   Modules accepted: Orders

## 2022-01-10 NOTE — Progress Notes (Signed)
Subjective:    Patient ID: Morgan Clay, female    DOB: Apr 11, 1946, 76 y.o.   MRN: [id number]  HPI  Pt in for follow up.  Last visit below.  "Chf chronic with dialysis. Cardiologist recently advised me that at this point modifications need to be made to compensate thru dialysis. Today cardiology note mentioned cardilogist will be talking to nephrologist.    Hopefully modifications will be made to dialysis.   Follow thru with upcoming echo on August 17th.    Continue current meds cardiolgist rx'd today.   Labs done last week at nephrologist are stable/over all good in light of your chronic health conditions."  Pt states she has been getting dialysis mon, wed and Friday. She states appears being more aggressive last visit with [name].   Pt states some shaking/chills intermittently.  She states nervous feeling type shaking. She states does feel some nervousness. On last visit with me had some anxiety.   Considered uds in past to order so can put in valium.  Dr. Lorelei Pont did work up on 12/29/2021. TSh was high. She is on levothyroxine.  Other labs showed new abnormality. Cxr did not show pneumonia but some chf.  For restless legs used gabapentin.      Review of Systems  Constitutional:  Positive for chills.       Maybe tremors when shakes?  Respiratory:  Negative for cough, chest tightness and shortness of breath.   Cardiovascular:  Negative for chest pain and palpitations.  Gastrointestinal:  Negative for abdominal pain, anal bleeding, constipation and nausea.  Genitourinary:  Negative for dysuria and flank pain.  Musculoskeletal:  Negative for back pain.  Skin:  Negative for rash.  Neurological:  Negative for dizziness and headaches.  Hematological:  Negative for adenopathy. Does not bruise/bleed easily.  Psychiatric/Behavioral:  Negative for behavioral problems, confusion and decreased concentration.     Past Medical History:  Diagnosis Date   Acute blood loss  anemia 09/24/2020   AICD (automatic cardioverter/defibrillator) present 10/06/2015   Anemia due to chronic blood loss 04/15/2020   Anemia in chronic kidney disease 01/18/2018   Angiodysplasia of small intestine 06/10/2020   Ileum - seen on capsule endoscopy 05/2020 - ablated at colonoscopy   Aortic atherosclerosis ([place]) 08/30/2017   Arthritis    "qwhere" (01/03/2018)   Asthma    reports mild asthma since childhood - had COPD on dx list from prior PCP   Atrial fibrillation with RVR ([place]) 11/17/2020   Atrial fibrillation, chronic 10/25/2017   Atypical atrial flutter ([place]) 09/28/2015   AVM (arteriovenous malformation) of small bowel, acquired 01/14/2018   Biventricular ICD (implantable cardioverter-defibrillator) in place 01/08/2007   Qualifier: Diagnosis of  By: Hassell Done FNP, Nykedtra     Bleeding pseudoaneurysm of left brachiocephalic arteriovenous fistula ([place]) 09/24/2020   Bleeding pseudoaneurysm of left brachiocephalic AV fistula ([place]) 09/24/2020   Carpal tunnel syndrome, bilateral 06/09/2021   Chronic bronchitis ([place])    "get it most years; not this past year though" (01/03/2018)   Chronic diastolic CHF (congestive heart failure) ([place]) 04/24/2007   Annotation: secondary to nonischemic cardiomyopathy s/p CRT-D Followed by Dr. Caryl Comes in Cardiology Qualifier: Diagnosis of  By: Hassell Done FNP, Nykedtra     Chronic obstructive pulmonary disease ([place])    Chronic systolic congestive heart failure (HCC)    CKD (chronic kidney disease) stage 4, GFR 15-29 ml/min  11/24/2013   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 (HCV) ([place])    HEPATITIS C - s/p treatment with  Harvoni, saw hepatology, Dawn Drazek   COVID-19 virus infection 05/27/2020   Diabetes mellitus without complication [facility])    DIET CONTROLLED    Dialysis complication    Diverticulitis of left colon status post robotic low anterior to sigmoid resection 05/22/2018 07/31/2017   End stage renal disease ([place]) 03/22/2021   FIBROIDS,  UTERUS 03/05/2008   Glaucoma    Gout    History of blood transfusion ~ 11/2017   Hypertension associated with diabetes ([place]) 06/07/2017   Hypothyroidism    LBBB (left bundle branch block)    Lesion of ulnar nerve 06/09/2021   Long term current use of anticoagulant    Lower GI bleeding    "been dealing w/it since 07/2017" (01/03/2018)   Malnutrition of moderate degree 07/09/2020   MDD (major depressive disorder), recurrent, in partial remission ([place]) 06/07/2017   Nonischemic cardiomyopathy ([place])    Obesity 05/27/2009   Qualifier: Diagnosis of  By: Hassell Done FNP, Nykedtra     OBSTRUCTIVE SLEEP APNEA 11/14/2007   no CPAP   OSTEOPENIA 09/30/2008   Other cirrhosis of liver ([place]) 08/12/2020   Formatting of this note is different from the original. Patient with history of cirrhosis secondary to hepatitis C with no history of hepatic decompensation.  She has had some hyponatremia and hypoalbuminemia in the past however most recent labs show relatively well-preserved liver synthetic function.  She will have labs to update liver synthetic function.  Hepatoma screening - patient is currentl   Overweight (BMI 25.0-29.9) 03/02/2020   Pain in right knee 05/13/2018   Persistent atrial fibrillation ([place]) 02/06/2020   Pneumonia    "several times" (01/03/2018)   Polyneuropathy associated with underlying disease ([place]) 06/09/2021   Pulmonary hypertension ([place]) on echocardiogram 01/14/2018   Secondary esophageal varices without bleeding ([place]) 12/23/2020   Formatting of this note might be different from the original. Patient had trace esophageal varices during small bowel enteroscopy in October 2021, follow-up EGD in January 2022 with no varices.  She previously had GI bleeding thought to be from AVM versus GAVE.  She is no longer on carvedilol and will need surveillance endoscopy.  Patient was previously followed at [place] [facility] [place] and I have asked her    Secondary hypercoagulable state ([place]) 02/06/2020   Type 2  diabetes, controlled, with renal manifestation ([place]) 11/24/2013     Social History   Socioeconomic History   Marital status: Divorced    Spouse name: Not on file   Number of children: 1   Years of education: 12   Highest education level: Not on file  Occupational History   Not on file  Tobacco Use   Smoking status: Never   Smokeless tobacco: Never  Vaping Use   Vaping Use: Never used  Substance and Sexual Activity   Alcohol use: Not Currently    Comment: 01/03/2018 "couple drinks/month"   Drug use: Not Currently    Types: Marijuana    Comment: VERY INTERMITTENT    Sexual activity: Not Currently  Other Topics Concern   Not on file  Social History Narrative   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      Home Situation: lives alone      Spiritual Beliefs: Christian - Baptist      Lifestyle: no regular exercise; poor diet      She is divorced at least one adult child   Never smoker    rare alcohol to occasional at best    no substance abuse  Social Determinants of Health   Financial Resource Strain: Low Risk  (07/15/2021)   Overall Financial Resource Strain (CARDIA)    Difficulty of Paying Living Expenses: Not hard at all  Food Insecurity: No Food Insecurity (07/15/2021)   Hunger Vital Sign    Worried About Running Out of Food in the Last Year: Never true    Ran Out of Food in the Last Year: Never true  Transportation Needs: No Transportation Needs (07/15/2021)   PRAPARE - Hydrologist (Medical): No    Lack of Transportation (Non-Medical): No  Physical Activity: Not on file  Stress: No Stress Concern Present (07/15/2021)   [place]    Feeling of Stress : Not at all  Social Connections: Moderately Isolated (07/15/2021)   Social Connection and Isolation Panel [NHANES]    Frequency of Communication with Friends and Family: More than three times a week    Frequency  of Social Gatherings with Friends and Family: Three times a week    Attends Religious Services: More than 4 times per year    Active Member of Clubs or Organizations: No    Attends Archivist Meetings: Never    Marital Status: Divorced  Human resources officer Violence: Not At Risk (07/15/2021)   Humiliation, Afraid, Rape, and Kick questionnaire    Fear of Current or Ex-Partner: No    Emotionally Abused: No    Physically Abused: No    Sexually Abused: No    Past Surgical History:  Procedure Laterality Date   A/V FISTULAGRAM N/A 03/28/2021   Procedure: A/V FISTULAGRAM;  Surgeon: Waynetta Sandy, MD;  Location: [place] [facility];  Service: Cardiovascular;  Laterality: N/A;   APPENDECTOMY     AV FISTULA PLACEMENT Left 08/05/2020   Procedure: LEFT UPPER EXTREMITY ARTERIOVENOUS (AV) FISTULA CREATION;  Surgeon: Cherre Robins, MD;  Location: [place];  Service: Vascular;  Laterality: Left;   [place] Left 09/16/2020   Procedure: LEFT SECOND STAGE [place];  Surgeon: Cherre Robins, MD;  Location: [place];  Service: Vascular;  Laterality: Left;  PERIPHERAL NERVE BLOCK   CARDIAC CATHETERIZATION     CARDIOVERSION N/A 12/14/2014   Procedure: CARDIOVERSION;  Surgeon: Dorothy Spark, MD;  Location: [place];  Service: Cardiovascular;  Laterality: N/A;   CARDIOVERSION N/A 02/26/2017   Procedure: CARDIOVERSION;  Surgeon: Evans Lance, MD;  Location: [place] [facility];  Service: Cardiovascular;  Laterality: N/A;   CARDIOVERSION N/A 07/24/2017   Procedure: CARDIOVERSION;  Surgeon: Acie Fredrickson Wonda Cheng, MD;  Location: [facility] ENDOSCOPY;  Service: Cardiovascular;  Laterality: N/A;   CARDIOVERSION N/A 02/12/2020   Procedure: CARDIOVERSION;  Surgeon: [name], MD;  Location: [facility] ENDOSCOPY;  Service: Cardiovascular;  Laterality: N/A;   CARDIOVERSION N/A 09/15/2021   Procedure: CARDIOVERSION;  Surgeon: [name], MD;  Location: [facility] ENDOSCOPY;   Service: Cardiovascular;  Laterality: N/A;   CARPAL TUNNEL RELEASE  07/21/2021   COLONOSCOPY N/A 11/08/2017   Procedure: COLONOSCOPY;  Surgeon: Milus Banister, MD;  Location: [facility] ENDOSCOPY;  Service: Endoscopy;  Laterality: N/A;   COLONOSCOPY WITH PROPOFOL N/A 05/31/2020   Procedure: COLONOSCOPY WITH PROPOFOL;  Surgeon: Irene Shipper, MD;  Location: [facility] ENDOSCOPY;  Service: Endoscopy;  Laterality: N/A;   COLONOSCOPY WITH PROPOFOL N/A 10/26/2021   Procedure: COLONOSCOPY WITH PROPOFOL;  Surgeon: Sharyn Creamer, MD;  Location: [place];  Service: Gastroenterology;  Laterality: N/A;   DILATION  AND CURETTAGE OF UTERUS     ENTEROSCOPY N/A 03/02/2020   Procedure: ENTEROSCOPY;  Surgeon: Yetta Flock, MD;  Location: [facility] ENDOSCOPY;  Service: Gastroenterology;  Laterality: N/A;   EP IMPLANTABLE DEVICE N/A 10/06/2015   Procedure: BIV ICD Generator Changeout;  Surgeon: [name], MD;  Location: [place] [facility];  Service: Cardiovascular;  Laterality: N/A;   ESOPHAGOGASTRODUODENOSCOPY N/A 10/26/2017   Procedure: ESOPHAGOGASTRODUODENOSCOPY (EGD);  Surgeon: Ladene Artist, MD;  [facility];  Service: Endoscopy;  Laterality: N/A;   ESOPHAGOGASTRODUODENOSCOPY (EGD) WITH PROPOFOL N/A 11/07/2017   Procedure: ESOPHAGOGASTRODUODENOSCOPY (EGD) WITH PROPOFOL;  Surgeon: Milus Banister, MD;  [facility];  Service: Endoscopy;  Laterality: N/A;   ESOPHAGOGASTRODUODENOSCOPY (EGD) WITH PROPOFOL N/A 05/29/2020   Procedure: ESOPHAGOGASTRODUODENOSCOPY (EGD) WITH PROPOFOL;  Surgeon: Doran Stabler, MD;  Location: [place];  Service: Gastroenterology;  Laterality: N/A;   ESOPHAGOGASTRODUODENOSCOPY (EGD) WITH PROPOFOL N/A 10/26/2021   Procedure: ESOPHAGOGASTRODUODENOSCOPY (EGD) WITH PROPOFOL;  Surgeon: Sharyn Creamer, MD;  Location: [place];  Service: Gastroenterology;  Laterality: N/A;   GIVENS CAPSULE STUDY N/A 05/19/2020   Procedure: GIVENS CAPSULE STUDY;  Surgeon:  [name], MD;  Location: [place];  Service: Endoscopy;  Laterality: N/A;  .adm for obs since pacemaker, PA wil enter order and see pt   GIVENS CAPSULE STUDY N/A 05/29/2020   Procedure: GIVENS CAPSULE STUDY;  Surgeon: Doran Stabler, MD;  Location: [place];  Service: Gastroenterology;  Laterality: N/A;   HEMATOMA EVACUATION Left 09/24/2020   Procedure: EVACUATION HEMATOMA ARM;  Surgeon: Rosetta Posner, MD;  Location: [facility] OR;  Service: Vascular;  Laterality: Left;   HOT HEMOSTASIS N/A 10/26/2017   Procedure: HOT HEMOSTASIS (ARGON PLASMA COAGULATION/BICAP);  Surgeon: Ladene Artist, MD;  Location: [facility];  Service: Endoscopy;  Laterality: N/A;   HOT HEMOSTASIS N/A 03/02/2020   Procedure: HOT HEMOSTASIS (ARGON PLASMA COAGULATION/BICAP);  Surgeon: Yetta Flock, MD;  Location[facility];  Service: Gastroenterology;  Laterality: N/A;   HOT HEMOSTASIS N/A 05/31/2020   Procedure: HOT HEMOSTASIS (ARGON PLASMA COAGULATION/BICAP);  Surgeon: Irene Shipper, MD;  Location: [facility] ENDOSCOPY;  Service: Endoscopy;  Laterality: N/A;   INSERT / REPLACE / REMOVE PACEMAKER  ?2008   IR PERC TUN PERIT CATH WO PORT S&I /IMAG  07/05/2020   PERIPHERAL VASCULAR INTERVENTION Left 03/28/2021   Procedure: PERIPHERAL VASCULAR INTERVENTION;  Surgeon: Waynetta Sandy, MD;  Location: [place] [facility];  Service: Cardiovascular;  Laterality: Left;   POLYPECTOMY  11/08/2017   Procedure: POLYPECTOMY;  Surgeon: Milus Banister, MD;  [facility];  Service: Endoscopy;;   PROCTOSCOPY N/A 05/22/2018   Procedure: RIGID PROCTOSCOPY;  Surgeon: Michael Boston, MD;  [facility];  Service: General;  Laterality: N/A;   RIGHT HEART CATH N/A 02/13/2018   Procedure: RIGHT HEART CATH;  Surgeon: [name], MD;  Location: [place] [facility];  Service: Cardiovascular;  Laterality: N/A;   RIGHT HEART CATH N/A 06/23/2020   Procedure: RIGHT HEART CATH;  Surgeon: [name], MD;   Location: [place] [facility];  Service: Cardiovascular;  Laterality: N/A;   RIGHT/LEFT HEART CATH AND CORONARY ANGIOGRAPHY N/A 12/11/2019   Procedure: RIGHT/LEFT HEART CATH AND CORONARY ANGIOGRAPHY;  Surgeon: [name], MD;  Location: [place] [facility];  Service: Cardiovascular;  Laterality: N/A;   SUBMUCOSAL TATTOO INJECTION  03/02/2020   Procedure: SUBMUCOSAL TATTOO INJECTION;  Surgeon: Yetta Flock, MD;  Location: [place];  Service:  Gastroenterology;;   SUBMUCOSAL TATTOO INJECTION  05/31/2020   Procedure: SUBMUCOSAL TATTOO INJECTION;  Surgeon: Irene Shipper, MD;  Location: [place];  Service: Endoscopy;;   TEE WITHOUT CARDIOVERSION N/A 08/02/2021   Procedure: TRANSESOPHAGEAL ECHOCARDIOGRAM (TEE);  Surgeon: [name], MD;  Location: [facility] ENDOSCOPY;  Service: Cardiovascular;  Laterality: N/A;   TUBAL LIGATION     XI ROBOTIC ASSISTED LOWER ANTERIOR RESECTION N/A 05/22/2018   Procedure: XI ROBOTIC ASSISTED SIGMOID COLOECTOMY MOBILIZATION OF SPLENIC FLEXURE, FIREFLY ASSESSMENT OF PERFUSION;  Surgeon: Michael Boston, MD;  [facility];  Service: General;  Laterality: N/A;  ERAS PATHWAY    Family History  Problem Relation Age of Onset   Asthma Father    Heart attack Father    Asthma Sister    Lung cancer Sister    Heart attack Mother    Stroke Brother    Colon cancer Neg Hx    Esophageal cancer Neg Hx    Pancreatic cancer Neg Hx    Stomach cancer Neg Hx    Liver disease Neg Hx     No Known Allergies  Current Outpatient Medications on File Prior to Visit  Medication Sig Dispense Refill   amiodarone (PACERONE) 200 MG tablet Take 1 tablet (200 mg total) by mouth daily. 90 tablet 3   diazepam (VALIUM) 5 MG tablet 1 tab po bid prn anxiety 10 tablet 0   ELIQUIS 2.5 MG TABS tablet Take 1 tablet by mouth twice daily 60 tablet 11   gabapentin (NEURONTIN) 300 MG capsule Take 1 capsule (300 mg total) by mouth daily. 180 capsule 2   levothyroxine (SYNTHROID) 137  MCG tablet Take 1 tablet (137 mcg total) by mouth daily before breakfast. 90 tablet 3   lidocaine-prilocaine (EMLA) cream 1 application. 3 (three) times a week.     nitroGLYCERIN (NITROSTAT) 0.4 MG SL tablet Place 1 tablet (0.4 mg total) under the tongue every 5 (five) minutes as needed for chest pain. 25 tablet 0   pantoprazole (PROTONIX) 40 MG tablet Take 1 tablet (40 mg total) by mouth 2 (two) times daily before a meal. 30 tablet 2   traZODone (DESYREL) 50 MG tablet Take 0.5-1 tablets (25-50 mg total) by mouth at bedtime as needed for sleep. 30 tablet 3   No current facility-administered medications on file prior to visit.    BP (!) 117/50   Pulse 70   Resp 18   Ht '4\' 11"'$  (1.499 m)   Wt 124 lb (56.2 kg)   SpO2 98%   BMI 25.04 kg/m        Objective:   Physical Exam   General Mental Status- Alert. General Appearance- Not in acute distress.   Skin General: Color- Normal Color. Moisture- Normal Moisture.  Neck Carotid Arteries- Normal color. Moisture- Normal Moisture. No carotid bruits. No JVD.  Chest and Lung Exam Auscultation: Breath Sounds:-Normal.  Cardiovascular Auscultation:Rythm- Regular. Murmurs & Other Heart Sounds:Auscultation of the heart reveals- No Murmurs.  Abdomen Inspection:-Inspeection Normal. Palpation/Percussion:Note:No mass. Palpation and Percussion of the abdomen reveal- Non Tender, Non Distended + BS, no rebound or guarding.   Neurologic Cranial Nerve exam:- CN III-XII intact(No nystagmus), symmetric smile. Strength:- 5/5 equal and symmetric strength both upper and lower extremities.   Upper ext- smooth upper ext movements. No rigidity and no tremors Lower ext- no pedal edam.    Assessment & Plan:   Patient Instructions  ESRD with chf. Currently no indicatio of chf flare. No pedal edema and no jvd. Continue  on current regimen of dialysis.  Chill work up negative. But low thyroid and tsh was high. Will get tsh and t4.  For anxiety you 3  tabs of valium. Will have you do drug screen today. Then have you sign contract when screen back.  For restless leg refilled your gabapentin.  Follow up date to be determined after lab review.    Mackie Pai, PA-C

## 2022-01-11 ENCOUNTER — Ambulatory Visit: Payer: Medicare Other | Admitting: Podiatry

## 2022-01-11 LAB — TSH: TSH: 5.16 u[IU]/mL (ref 0.35–5.50)

## 2022-01-11 LAB — T4, FREE: Free T4: 1.64 ng/dL — ABNORMAL HIGH (ref 0.60–1.60)

## 2022-01-12 ENCOUNTER — Ambulatory Visit (INDEPENDENT_AMBULATORY_CARE_PROVIDER_SITE_OTHER): Payer: Medicare Other | Admitting: Podiatry

## 2022-01-12 ENCOUNTER — Encounter: Payer: Self-pay | Admitting: Podiatry

## 2022-01-12 DIAGNOSIS — Q828 Other specified congenital malformations of skin: Secondary | ICD-10-CM | POA: Diagnosis not present

## 2022-01-12 DIAGNOSIS — Z7901 Long term (current) use of anticoagulants: Secondary | ICD-10-CM | POA: Diagnosis not present

## 2022-01-12 LAB — DRUG TOX MONITOR 1 W/CONF, ORAL FLD
Alprazolam: NEGATIVE ng/mL (ref <0.50)
Amphetamines: NEGATIVE ng/mL (ref <10)
Barbiturates: NEGATIVE ng/mL (ref <10)
Benzodiazepines: POSITIVE ng/mL — AB (ref <0.50)
Buprenorphine: NEGATIVE ng/mL (ref <0.10)
Chlordiazepoxide: NEGATIVE ng/mL (ref <0.50)
Clonazepam: NEGATIVE ng/mL (ref <0.50)
Cocaine: NEGATIVE ng/mL (ref <5.0)
Cotinine: 16.1 ng/mL — ABNORMAL HIGH (ref <5.0)
Diazepam: 0.5 ng/mL — ABNORMAL HIGH (ref <0.50)
Fentanyl: NEGATIVE ng/mL (ref <0.10)
Flunitrazepam: NEGATIVE ng/mL (ref <0.50)
Flurazepam: NEGATIVE ng/mL (ref <0.50)
Heroin Metabolite: NEGATIVE ng/mL (ref <1.0)
Lorazepam: NEGATIVE ng/mL (ref <0.50)
MARIJUANA: NEGATIVE ng/mL (ref <2.5)
MDMA: NEGATIVE ng/mL (ref <10)
Meprobamate: NEGATIVE ng/mL (ref <2.5)
Methadone: NEGATIVE ng/mL (ref <5.0)
Midazolam: NEGATIVE ng/mL (ref <0.50)
Nicotine Metabolite: POSITIVE ng/mL — AB (ref <5.0)
Nordiazepam: NEGATIVE ng/mL (ref <0.50)
Opiates: NEGATIVE ng/mL (ref <2.5)
Oxazepam: NEGATIVE ng/mL (ref <0.50)
Phencyclidine: NEGATIVE ng/mL (ref <10)
Tapentadol: NEGATIVE ng/mL (ref <5.0)
Temazepam: NEGATIVE ng/mL (ref <0.50)
Tramadol: NEGATIVE ng/mL (ref <5.0)
Triazolam: NEGATIVE ng/mL (ref <0.50)
Zolpidem: NEGATIVE ng/mL (ref <5.0)

## 2022-01-15 ENCOUNTER — Telehealth: Payer: Self-pay | Admitting: Family Medicine

## 2022-01-15 DIAGNOSIS — E039 Hypothyroidism, unspecified: Secondary | ICD-10-CM

## 2022-01-15 NOTE — Telephone Encounter (Signed)
Pt did not read mychart message- called her about her thyroid levels.  However, Percell Miller already rechecked her TSH and all is ok.  Continue current dosage  It was a pleasure seeing you in clinic.  Here is a copy of your recent labs:        Results for orders placed or performed in visit on 12/29/21  CBC  Result Value Ref Range    WBC 5.1 4.0 - 10.5 K/uL    RBC 4.57 3.87 - 5.11 Mil/uL    Platelets 143.0 (L) 150.0 - 400.0 K/uL    Hemoglobin 13.3 12.0 - 15.0 g/dL    HCT 41.2 36.0 - 46.0 %    MCV 90.1 78.0 - 100.0 fl    MCHC 32.2 30.0 - 36.0 g/dL    RDW 16.8 (H) 11.5 - 15.5 %  Comprehensive metabolic panel  Result Value Ref Range    Sodium 137 135 - 145 mEq/L    Potassium 4.5 3.5 - 5.1 mEq/L    Chloride 91 (L) 96 - 112 mEq/L    CO2 31 19 - 32 mEq/L    Glucose, Bld 115 (H) 70 - 99 mg/dL    BUN 23 6 - 23 mg/dL    Creatinine, Ser 5.65 (HH) 0.40 - 1.20 mg/dL    Total Bilirubin 0.7 0.2 - 1.2 mg/dL    Alkaline Phosphatase 107 39 - 117 U/L    AST 20 0 - 37 U/L    ALT 13 0 - 35 U/L    Total Protein 8.5 (H) 6.0 - 8.3 g/dL    Albumin 4.5 3.5 - 5.2 g/dL    GFR 6.98 (LL) >60.00 mL/min    Calcium 8.9 8.4 - 10.5 mg/dL  TSH  Result Value Ref Range    TSH 11.33 (H) 0.35 - 5.50 uIU/mL    *Note: Due to a large number of results and/or encounters for the requested time period, some results have not been displayed. A complete set of results can be found in Results Review.    Blood count showed minimally low platelet count, otherwise normal Metabolic profile shows kidney dysfunction, this is expected as you are on dialysis!    Your TSH is elevated; this may indicate that we need to increase your dose of thyroid medication assuming you are taking it regularly?   How are you feeling?  Please let me know if you have been consistently taking your thyroid medication.  If so I will increase the dose for you

## 2022-01-15 NOTE — Progress Notes (Signed)
  Subjective:  Patient ID: Bonny Vanleeuwen, female    DOB: 10/21/1945,  MRN: [id number]  Chief Complaint  Patient presents with   Nail Problem    Thick painful toenails, patient is taking Eliquis for A-fib   Callouses    Bilateral corns and calluses   Diabetes    Diabetic foot exam    76 y.o. female presents with the above complaint. History confirmed with patient.   Objective:  Physical Exam: warm, good capillary refill, no trophic changes or ulcerative lesions, normal DP and PT pulses, normal monofilament exam, and normal sensory exam.  Large painful porokeratosis  Assessment:   1. Porokeratosis   2. Chronic anticoagulation      Plan:  Patient was evaluated and treated and all questions answered.  All symptomatic hyperkeratoses were safely debrided with a sterile #15 blade to patient's level of comfort without incident. We discussed preventative and palliative care of these lesions including supportive and accommodative shoegear, padding, prefabricated and custom molded accommodative orthoses, use of a pumice stone and lotions/creams daily.   Return in about 3 months (around 04/13/2022) for painful corn .

## 2022-01-16 MED ORDER — LEVOTHYROXINE SODIUM 125 MCG PO TABS: 125.0000 ug | ORAL_TABLET | Freq: Every day | ORAL | 11 refills | Status: DC

## 2022-01-16 NOTE — Addendum Note (Signed)
Addended by: Anabel Halon on: 01/16/2022 05:54 PM   Modules accepted: Orders

## 2022-01-17 ENCOUNTER — Telehealth: Payer: Self-pay

## 2022-01-17 NOTE — Telephone Encounter (Signed)
Patient returned call to go over lab results. Informed patient of message from [facility] and patient states she understands but would like follow up appointment with provider to sign contract and to discuss labs and new prescription sent to pharmacy for [place]. Patient is scheduled for 02/07/2022 @ 10:20 am.

## 2022-01-18 NOTE — Telephone Encounter (Signed)
Pt notified of new medication Stated she will come by tomorrow to sign contract for valium

## 2022-01-19 MED ORDER — DIAZEPAM 5 MG PO TABS: ORAL_TABLET | ORAL | 0 refills | Status: DC

## 2022-01-19 NOTE — Telephone Encounter (Signed)
Yes last refill was 12/06/2021

## 2022-01-19 NOTE — Addendum Note (Signed)
Addended by: Anabel Halon on: 01/19/2022 02:34 PM   Modules accepted: Orders

## 2022-01-19 NOTE — Telephone Encounter (Signed)
Patient signed contract today

## 2022-01-19 NOTE — Telephone Encounter (Signed)
30

## 2022-01-26 NOTE — Progress Notes (Signed)
Remote ICD transmission.   

## 2022-02-02 ENCOUNTER — Ambulatory Visit (INDEPENDENT_AMBULATORY_CARE_PROVIDER_SITE_OTHER): Payer: Medicare Other

## 2022-02-02 DIAGNOSIS — I428 Other cardiomyopathies: Secondary | ICD-10-CM

## 2022-02-02 LAB — CUP PACEART REMOTE DEVICE CHECK
Battery Remaining Longevity: 3 mo
Battery Voltage: 2.79 V
Brady Statistic AP VP Percent: 0.19 %
Brady Statistic AP VS Percent: 0.01 %
Brady Statistic AS VP Percent: 99.75 %
Brady Statistic AS VS Percent: 0.06 %
Brady Statistic RA Percent Paced: 0.19 %
Brady Statistic RV Percent Paced: 99.93 %
Date Time Interrogation Session: 20230928022827
HighPow Impedance: 47 Ohm
HighPow Impedance: 79 Ohm
Implantable Lead Implant Date: 20080818
Implantable Lead Implant Date: 20080818
Implantable Lead Implant Date: 20080818
Implantable Lead Location: 753858
Implantable Lead Location: 753859
Implantable Lead Location: 753860
Implantable Lead Model: 4193
Implantable Lead Model: 5076
Implantable Lead Model: 6947
Implantable Pulse Generator Implant Date: 20170531
Lead Channel Impedance Value: 266 Ohm
Lead Channel Impedance Value: 342 Ohm
Lead Channel Impedance Value: 4047 Ohm
Lead Channel Impedance Value: 4047 Ohm
Lead Channel Impedance Value: 437 Ohm
Lead Channel Impedance Value: 513 Ohm
Lead Channel Pacing Threshold Amplitude: 0.875 V
Lead Channel Pacing Threshold Amplitude: 1.125 V
Lead Channel Pacing Threshold Amplitude: 1.625 V
Lead Channel Pacing Threshold Pulse Width: 0.4 ms
Lead Channel Pacing Threshold Pulse Width: 0.4 ms
Lead Channel Pacing Threshold Pulse Width: 0.4 ms
Lead Channel Sensing Intrinsic Amplitude: 10.375 mV
Lead Channel Sensing Intrinsic Amplitude: 10.375 mV
Lead Channel Sensing Intrinsic Amplitude: 3.375 mV
Lead Channel Sensing Intrinsic Amplitude: 3.375 mV
Lead Channel Setting Pacing Amplitude: 2 V
Lead Channel Setting Pacing Amplitude: 2.25 V
Lead Channel Setting Pacing Amplitude: 2.5 V
Lead Channel Setting Pacing Pulse Width: 0.4 ms
Lead Channel Setting Pacing Pulse Width: 0.4 ms
Lead Channel Setting Sensing Sensitivity: 0.3 mV

## 2022-02-07 ENCOUNTER — Ambulatory Visit (INDEPENDENT_AMBULATORY_CARE_PROVIDER_SITE_OTHER): Payer: Medicare Other | Admitting: Medical

## 2022-02-07 VITALS — BP 107/50 | HR 70 | Temp 98.0°F | Resp 18 | Ht 59.0 in | Wt 121.8 lb

## 2022-02-07 DIAGNOSIS — I5032 Chronic diastolic (congestive) heart failure: Secondary | ICD-10-CM | POA: Diagnosis not present

## 2022-02-07 DIAGNOSIS — L299 Pruritus, unspecified: Secondary | ICD-10-CM

## 2022-02-07 DIAGNOSIS — F419 Anxiety disorder, unspecified: Secondary | ICD-10-CM | POA: Diagnosis not present

## 2022-02-07 DIAGNOSIS — N186 End stage renal disease: Secondary | ICD-10-CM

## 2022-02-07 DIAGNOSIS — G2581 Restless legs syndrome: Secondary | ICD-10-CM

## 2022-02-07 MED ORDER — HYDROXYZINE HCL 10 MG PO TABS: ORAL_TABLET | ORAL | 0 refills | Status: DC

## 2022-02-07 NOTE — Progress Notes (Signed)
Subjective:    Patient ID: Morgan Clay, female    DOB: 06/19/1945, 76 y.o.   MRN: [id number]  HPI  Pt has anxiety. Pt has about 10 tabs of valium left. Pt has signed contract and gave uds.   Pt states she has some itching over her body. Pt states dialysis is going to give her medication thru iv for itching. Itching occurs sporadically. She states benadryl seemed to help.  ESRD with chf. Currently no indicatio of chf flare. No pedal edema and no jvd. Continue on current regimen of dialysis.  Has dialysis tomorrow. Monday, wed and Friday.    Review of Systems  Constitutional:  Negative for chills, fatigue and fever.  Respiratory:  Negative for cough, chest tightness and wheezing.   Cardiovascular:  Negative for chest pain and palpitations.  Gastrointestinal:  Negative for abdominal pain.  Genitourinary:  Negative for dyspareunia and dysuria.  Musculoskeletal:  Negative for back pain and joint swelling.  Skin:        itching  Neurological:  Negative for facial asymmetry, speech difficulty, weakness, light-headedness and numbness.  Hematological:  Negative for adenopathy. Does not bruise/bleed easily.  Psychiatric/Behavioral:  Negative for behavioral problems, confusion and suicidal ideas. The patient is nervous/anxious.     Past Medical History:  Diagnosis Date   Acute blood loss anemia 09/24/2020   AICD (automatic cardioverter/defibrillator) present 10/06/2015   Anemia due to chronic blood loss 04/15/2020   Anemia in chronic kidney disease 01/18/2018   Angiodysplasia of small intestine 06/10/2020   Ileum - seen on capsule endoscopy 05/2020 - ablated at colonoscopy   Aortic atherosclerosis ([place]) 08/30/2017   Arthritis    "qwhere" (01/03/2018)   Asthma    reports mild asthma since childhood - had COPD on dx list from prior PCP   Atrial fibrillation with RVR ([place]) 11/17/2020   Atrial fibrillation, chronic 10/25/2017   Atypical atrial flutter ([place]) 09/28/2015   AVM  (arteriovenous malformation) of small bowel, acquired 01/14/2018   Biventricular ICD (implantable cardioverter-defibrillator) in place 01/08/2007   Qualifier: Diagnosis of  By: Hassell Done FNP, Nykedtra     Bleeding pseudoaneurysm of left brachiocephalic arteriovenous fistula ([place]) 09/24/2020   Bleeding pseudoaneurysm of left brachiocephalic AV fistula ([place]) 09/24/2020   Carpal tunnel syndrome, bilateral 06/09/2021   Chronic bronchitis ([place])    "get it most years; not this past year though" (01/03/2018)   Chronic diastolic CHF (congestive heart failure) ([place]) 04/24/2007   Annotation: secondary to nonischemic cardiomyopathy s/p CRT-D Followed by Dr. Caryl Comes in Cardiology Qualifier: Diagnosis of  By: Hassell Done FNP, Nykedtra     Chronic obstructive pulmonary disease ([place])    Chronic systolic congestive heart failure ([place])    CKD (chronic kidney disease) stage 4, GFR 15-29 ml/min  11/24/2013   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 (HCV) ([place])    HEPATITIS C - s/p treatment with Harvoni, saw hepatology, Dawn Drazek   COVID-19 virus infection 05/27/2020   Diabetes mellitus without complication [facility])    DIET CONTROLLED    Dialysis complication    Diverticulitis of left colon status post robotic low anterior to sigmoid resection 05/22/2018 07/31/2017   End stage renal disease ([place]) 03/22/2021   FIBROIDS, UTERUS 03/05/2008   Glaucoma    Gout    History of blood transfusion ~ 11/2017   Hypertension associated with diabetes ([place]) 06/07/2017   Hypothyroidism    LBBB (left bundle branch block)    Lesion of ulnar nerve 06/09/2021   Long term current use  of anticoagulant    Lower GI bleeding    "been dealing w/it since 07/2017" (01/03/2018)   Malnutrition of moderate degree 07/09/2020   MDD (major depressive disorder), recurrent, in partial remission ([place]) 06/07/2017   Nonischemic cardiomyopathy ([place])    Obesity 05/27/2009   Qualifier: Diagnosis of  By: Hassell Done FNP, Nykedtra     OBSTRUCTIVE SLEEP  APNEA 11/14/2007   no CPAP   OSTEOPENIA 09/30/2008   Other cirrhosis of liver ([place]) 08/12/2020   Formatting of this note is different from the original. Patient with history of cirrhosis secondary to hepatitis C with no history of hepatic decompensation.  She has had some hyponatremia and hypoalbuminemia in the past however most recent labs show relatively well-preserved liver synthetic function.  She will have labs to update liver synthetic function.  Hepatoma screening - patient is currentl   Overweight (BMI 25.0-29.9) 03/02/2020   Pain in right knee 05/13/2018   Persistent atrial fibrillation ([place]) 02/06/2020   Pneumonia    "several times" (01/03/2018)   Polyneuropathy associated with underlying disease ([place]) 06/09/2021   Pulmonary hypertension ([place]) on echocardiogram 01/14/2018   Secondary esophageal varices without bleeding ([place]) 12/23/2020   Formatting of this note might be different from the original. Patient had trace esophageal varices during small bowel enteroscopy in October 2021, follow-up EGD in January 2022 with no varices.  She previously had GI bleeding thought to be from AVM versus GAVE.  She is no longer on carvedilol and will need surveillance endoscopy.  Patient was previously followed at [facility] [place] and I have asked her    Secondary hypercoagulable state ([place]) 02/06/2020   Type 2 diabetes, controlled, with renal manifestation ([place]) 11/24/2013     Social History   Socioeconomic History   Marital status: Divorced    Spouse name: Not on file   Number of children: 1   Years of education: 12   Highest education level: Not on file  Occupational History   Not on file  Tobacco Use   Smoking status: Never   Smokeless tobacco: Never  Vaping Use   Vaping Use: Never used  Substance and Sexual Activity   Alcohol use: Not Currently    Comment: 01/03/2018 "couple drinks/month"   Drug use: Not Currently    Types: Marijuana    Comment: VERY INTERMITTENT    Sexual activity: Not  Currently  Other Topics Concern   Not on file  Social History Narrative   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      Home Situation: lives alone      Spiritual Beliefs: Christian - [employer]      Lifestyle: no regular exercise; poor diet      She is divorced at least one adult child   Never smoker    rare alcohol to occasional at best    no substance abuse         Social Determinants of Health   Financial Resource Strain: Low Risk  (07/15/2021)   Overall Financial Resource Strain (CARDIA)    Difficulty of Paying Living Expenses: Not hard at all  Food Insecurity: No Food Insecurity (07/15/2021)   Hunger Vital Sign    Worried About Running Out of Food in the Last Year: Never true    Ran Out of Food in the Last Year: Never true  Transportation Needs: No Transportation Needs (07/15/2021)   PRAPARE - Hydrologist (Medical): No    Lack of Transportation (Non-Medical): No  Physical Activity:  Not on file  Stress: No Stress Concern Present (07/15/2021)   [place]    Feeling of Stress : Not at all  Social Connections: Moderately Isolated (07/15/2021)   Social Connection and Isolation Panel [NHANES]    Frequency of Communication with Friends and Family: More than three times a week    Frequency of Social Gatherings with Friends and Family: Three times a week    Attends Religious Services: More than 4 times per year    Active Member of Clubs or Organizations: No    Attends Archivist Meetings: Never    Marital Status: Divorced  Human resources officer Violence: Not At Risk (07/15/2021)   Humiliation, Afraid, Rape, and Kick questionnaire    Fear of Current or Ex-Partner: No    Emotionally Abused: No    Physically Abused: No    Sexually Abused: No    Past Surgical History:  Procedure Laterality Date   A/V FISTULAGRAM N/A 03/28/2021   Procedure: A/V FISTULAGRAM;  Surgeon: Waynetta Sandy, MD;  Location: [place] [facility];  Service: Cardiovascular;  Laterality: N/A;   APPENDECTOMY     AV FISTULA PLACEMENT Left 08/05/2020   Procedure: LEFT UPPER EXTREMITY ARTERIOVENOUS (AV) FISTULA CREATION;  Surgeon: Cherre Robins, MD;  Location: [place];  Service: Vascular;  Laterality: Left;   [place] Left 09/16/2020   Procedure: LEFT SECOND STAGE [place];  Surgeon: Cherre Robins, MD;  Location: [place];  Service: Vascular;  Laterality: Left;  PERIPHERAL NERVE BLOCK   CARDIAC CATHETERIZATION     CARDIOVERSION N/A 12/14/2014   Procedure: CARDIOVERSION;  Surgeon: Dorothy Spark, MD;  Location: [place];  Service: Cardiovascular;  Laterality: N/A;   CARDIOVERSION N/A 02/26/2017   Procedure: CARDIOVERSION;  Surgeon: Evans Lance, MD;  Location: [place] [facility];  Service: Cardiovascular;  Laterality: N/A;   CARDIOVERSION N/A 07/24/2017   Procedure: CARDIOVERSION;  Surgeon: Acie Fredrickson Wonda Cheng, MD;  Location: [facility] ENDOSCOPY;  Service: Cardiovascular;  Laterality: N/A;   CARDIOVERSION N/A 02/12/2020   Procedure: CARDIOVERSION;  Surgeon: [name], MD;  Location: [facility] ENDOSCOPY;  Service: Cardiovascular;  Laterality: N/A;   CARDIOVERSION N/A 09/15/2021   Procedure: CARDIOVERSION;  Surgeon: [name], MD;  Location: [facility] ENDOSCOPY;  Service: Cardiovascular;  Laterality: N/A;   CARPAL TUNNEL RELEASE  07/21/2021   COLONOSCOPY N/A 11/08/2017   Procedure: COLONOSCOPY;  Surgeon: Milus Banister, MD;  Location: [facility] ENDOSCOPY;  Service: Endoscopy;  Laterality: N/A;   COLONOSCOPY WITH PROPOFOL N/A 05/31/2020   Procedure: COLONOSCOPY WITH PROPOFOL;  Surgeon: Irene Shipper, MD;  Location: [facility] ENDOSCOPY;  Service: Endoscopy;  Laterality: N/A;   COLONOSCOPY WITH PROPOFOL N/A 10/26/2021   Procedure: COLONOSCOPY WITH PROPOFOL;  Surgeon: Sharyn Creamer, MD;  Location: [place];  Service: Gastroenterology;  Laterality: N/A;   DILATION  AND CURETTAGE OF UTERUS     ENTEROSCOPY N/A 03/02/2020   Procedure: ENTEROSCOPY;  Surgeon: Yetta Flock, MD;  Location: [facility] ENDOSCOPY;  Service: Gastroenterology;  Laterality: N/A;   EP IMPLANTABLE DEVICE N/A 10/06/2015   Procedure: BIV ICD Generator Changeout;  Surgeon: [name], MD;  Location: [place] [facility];  Service: Cardiovascular;  Laterality: N/A;   ESOPHAGOGASTRODUODENOSCOPY N/A 10/26/2017   Procedure: ESOPHAGOGASTRODUODENOSCOPY (EGD);  Surgeon: Ladene Artist, MD;  Location[facility];  Service: Endoscopy;  Laterality: N/A;   ESOPHAGOGASTRODUODENOSCOPY (EGD) WITH PROPOFOL N/A 11/07/2017   Procedure: ESOPHAGOGASTRODUODENOSCOPY (EGD) WITH  PROPOFOL;  Surgeon: Milus Banister, MD;  Location: [facility];  Service: Endoscopy;  Laterality: N/A;   ESOPHAGOGASTRODUODENOSCOPY (EGD) WITH PROPOFOL N/A 05/29/2020   Procedure: ESOPHAGOGASTRODUODENOSCOPY (EGD) WITH PROPOFOL;  Surgeon: Doran Stabler, MD;  Location: [place];  Service: Gastroenterology;  Laterality: N/A;   ESOPHAGOGASTRODUODENOSCOPY (EGD) WITH PROPOFOL N/A 10/26/2021   Procedure: ESOPHAGOGASTRODUODENOSCOPY (EGD) WITH PROPOFOL;  Surgeon: Sharyn Creamer, MD;  Location: [place];  Service: Gastroenterology;  Laterality: N/A;   GIVENS CAPSULE STUDY N/A 05/19/2020   Procedure: GIVENS CAPSULE STUDY;  Surgeon: [name], MD;  Location: [place];  Service: Endoscopy;  Laterality: N/A;  .adm for obs since pacemaker, PA wil enter order and see pt   GIVENS CAPSULE STUDY N/A 05/29/2020   Procedure: GIVENS CAPSULE STUDY;  Surgeon: Doran Stabler, MD;  Location: [place];  Service: Gastroenterology;  Laterality: N/A;   HEMATOMA EVACUATION Left 09/24/2020   Procedure: EVACUATION HEMATOMA ARM;  Surgeon: Rosetta Posner, MD;  Location: [facility] OR;  Service: Vascular;  Laterality: Left;   HOT HEMOSTASIS N/A 10/26/2017   Procedure: HOT HEMOSTASIS (ARGON PLASMA COAGULATION/BICAP);  Surgeon: Ladene Artist,  MD;  Location: [facility];  Service: Endoscopy;  Laterality: N/A;   HOT HEMOSTASIS N/A 03/02/2020   Procedure: HOT HEMOSTASIS (ARGON PLASMA COAGULATION/BICAP);  Surgeon: Yetta Flock, MD;  Location[facility]COPY;  Service: Gastroenterology;  Laterality: N/A;   HOT HEMOSTASIS N/A 05/31/2020   Procedure: HOT HEMOSTASIS (ARGON PLASMA COAGULATION/BICAP);  Surgeon: Irene Shipper, MD;  Location: [facility] ENDOSCOPY;  Service: Endoscopy;  Laterality: N/A;   INSERT / REPLACE / REMOVE PACEMAKER  ?2008   IR PERC TUN PERIT CATH WO PORT S&I /IMAG  07/05/2020   PERIPHERAL VASCULAR INTERVENTION Left 03/28/2021   Procedure: PERIPHERAL VASCULAR INTERVENTION;  Surgeon: Waynetta Sandy, MD;  Location: [place] [facility];  Service: Cardiovascular;  Laterality: Left;   POLYPECTOMY  11/08/2017   Procedure: POLYPECTOMY;  Surgeon: Milus Banister, MD;  [facility];  Service: Endoscopy;;   PROCTOSCOPY N/A 05/22/2018   Procedure: RIGID PROCTOSCOPY;  Surgeon: Michael Boston, MD;  [facility];  Service: General;  Laterality: N/A;   RIGHT HEART CATH N/A 02/13/2018   Procedure: RIGHT HEART CATH;  Surgeon: [name], MD;  Location: [place] [facility];  Service: Cardiovascular;  Laterality: N/A;   RIGHT HEART CATH N/A 06/23/2020   Procedure: RIGHT HEART CATH;  Surgeon: [name], MD;  Location: [place] [facility];  Service: Cardiovascular;  Laterality: N/A;   RIGHT/LEFT HEART CATH AND CORONARY ANGIOGRAPHY N/A 12/11/2019   Procedure: RIGHT/LEFT HEART CATH AND CORONARY ANGIOGRAPHY;  Surgeon: [name], MD;  Location: [place] [facility];  Service: Cardiovascular;  Laterality: N/A;   SUBMUCOSAL TATTOO INJECTION  03/02/2020   Procedure: SUBMUCOSAL TATTOO INJECTION;  Surgeon: Yetta Flock, MD;  Location[facility] ENDOSCOPY;  Service: Gastroenterology;;   SUBMUCOSAL TATTOO INJECTION  05/31/2020   Procedure: SUBMUCOSAL TATTOO INJECTION;  Surgeon: Irene Shipper, MD;  Location: [place];  Service: Endoscopy;;   TEE WITHOUT CARDIOVERSION N/A 08/02/2021   Procedure: TRANSESOPHAGEAL ECHOCARDIOGRAM (TEE);  Surgeon: [name], MD;  Location: [place] ENDOSCOPY;  Service: Cardiovascular;  Laterality: N/A;   TUBAL LIGATION     XI ROBOTIC ASSISTED LOWER ANTERIOR RESECTION N/A 05/22/2018   Procedure: XI ROBOTIC ASSISTED SIGMOID COLOECTOMY MOBILIZATION OF SPLENIC FLEXURE, FIREFLY ASSESSMENT OF PERFUSION;  Surgeon: Michael Boston, MD;  Location: [facility];  Service: General;  Laterality: N/A;  ERAS PATHWAY    Family History  Problem Relation Age of Onset   Asthma Father    Heart attack Father    Asthma Sister    Lung cancer Sister    Heart attack Mother    Stroke Brother    Colon cancer Neg Hx    Esophageal cancer Neg Hx    Pancreatic cancer Neg Hx    Stomach cancer Neg Hx    Liver disease Neg Hx     No Known Allergies  Current Outpatient Medications on File Prior to Visit  Medication Sig Dispense Refill   amiodarone (PACERONE) 200 MG tablet Take 1 tablet (200 mg total) by mouth daily. 90 tablet 3   cyclobenzaprine (FLEXERIL) 5 MG tablet Take 5 mg by mouth at bedtime as needed.     diazepam (VALIUM) 5 MG tablet 1 tab po bid prn anxiety 10 tablet 0   ELIQUIS 2.5 MG TABS tablet Take 1 tablet by mouth twice daily 60 tablet 11   gabapentin (NEURONTIN) 300 MG capsule Take 1 capsule (300 mg total) by mouth daily. Fill one rx. Computer may have sent over 2 90 capsule 2   levothyroxine (SYNTHROID) 125 MCG tablet Take 1 tablet (125 mcg total) by mouth daily before breakfast. 30 tablet 11   lidocaine-prilocaine (EMLA) cream 1 application. 3 (three) times a week.     nitroGLYCERIN (NITROSTAT) 0.4 MG SL tablet Place 1 tablet (0.4 mg total) under the tongue every 5 (five) minutes as needed for chest pain. 25 tablet 0   pantoprazole (PROTONIX) 40 MG tablet Take 1 tablet (40 mg total) by mouth 2 (two) times daily before a meal. 30 tablet 2   rOPINIRole (REQUIP) 0.25 MG tablet Take  0.25 mg by mouth at bedtime.     traZODone (DESYREL) 50 MG tablet Take 0.5-1 tablets (25-50 mg total) by mouth at bedtime as needed for sleep. 30 tablet 3   XIIDRA 5 % SOLN      No current facility-administered medications on file prior to visit.    BP (!) 107/50   Pulse 70   Temp 98 F (36.7 C)   Resp 18   Ht '4\' 11"'$  (1.499 m)   Wt 121 lb 12.8 oz (55.2 kg)   SpO2 100%   BMI 24.60 kg/m        Objective:   Physical Exam  General- No acute distress. Pleasant patient. Neck- Full range of motion, no jvd Lungs- Clear, even and unlabored. Heart- regular rate and rhythm. Neurologic- CNII- XII grossly intact.      Assessment & Plan:   Patient Instructions  ESRD with chf. Currently no indication of chf flare. No pedal edema and no jvd. Continue on current regimen of dialysis.    For anxiety use valium sparingly as we discussed.  For itching of skin likely associated with ckd. Let me know what med nephrologist office is giving you after dialysis. Will make low dose hydroxyzine to use at night if itching occurs at night. But reminder not to use with valium.  For restless leg refilled your gabapentin.  Follow up 3 months or sooner if needed.    Mackie Pai, PA-C

## 2022-02-07 NOTE — Patient Instructions (Addendum)
ESRD with chf. Currently no indication of chf flare. No pedal edema and no jvd. Continue on current regimen of dialysis.    For anxiety use valium sparingly as we discussed.  For itching of skin likely associated with ckd. Let me know what med nephrologist office is giving you after dialysis. Will make low dose hydroxyzine to use at night if itching occurs at night. But reminder not to use with valium.  For restless leg refilled your gabapentin.  Follow up 3 months or sooner if needed.

## 2022-02-08 NOTE — Progress Notes (Signed)
Remote ICD transmission.   

## 2022-02-13 ENCOUNTER — Telehealth: Payer: Self-pay | Admitting: Medical

## 2022-02-13 NOTE — Telephone Encounter (Signed)
Gregary Signs , Nurse Case Manager with [facility], called to inform patient's PCP that she has been assigned a nurse case manager and they will be reaching out to her to see if there are any community resources that would be of benefit to her. IF there are any care coordination needs, please feel free to reach out to her at [phone] ext [phone]

## 2022-02-16 ENCOUNTER — Other Ambulatory Visit: Payer: Self-pay | Admitting: Nurse Practitioner

## 2022-02-16 DIAGNOSIS — K7469 Other cirrhosis of liver: Secondary | ICD-10-CM

## 2022-02-28 ENCOUNTER — Ambulatory Visit
Admission: RE | Admit: 2022-02-28 | Discharge: 2022-02-28 | Disposition: A | Discharge status: Home / Self Care | Payer: Medicare Other | Source: Ambulatory Visit | Attending: Nurse Practitioner | Admitting: Nurse Practitioner

## 2022-02-28 DIAGNOSIS — K7469 Other cirrhosis of liver: Secondary | ICD-10-CM

## 2022-03-05 NOTE — Progress Notes (Unsigned)
03/05/2022 Morgan Clay [id number] 09-15-1945   Chief Complaint:  History of Present Illness: Morgan Clay is a 76 year old female with a past medical history of anxiety, atrial fib/flutter on Eliquis, CHF, nonischemic cardiomyopathy, s/p biventricular pacemaker/ICD placement, DM II, ESRD on HD, hypothyroidism, hep c cirrhosis s/p treatment with Harvoni, diverticular  disease s/p sigmoid colectomy in 2020 with recurrent GI bleed 10/2021, likely diverticular bleed. She is followed by [name].     ICD not compatible.     Latest Ref Rng & Units 12/29/2021   12:11 PM 12/06/2021   11:57 AM 11/10/2021   11:51 AM  CBC  WBC 4.0 - 10.5 K/uL 5.1  4.8  3.9   Hemoglobin 12.0 - 15.0 g/dL 13.3  11.6  11.0   Hematocrit 36.0 - 46.0 % 41.2  35.4  34.4   Platelets 150.0 - 400.0 K/uL 143.0  105  150.0        Latest Ref Rng & Units 12/29/2021   12:11 PM 11/17/2021   10:33 AM 11/10/2021   11:51 AM  CMP  Glucose 70 - 99 mg/dL 115  76  75   BUN 6 - 23 mg/dL '23  13  12   '$ Creatinine 0.40 - 1.20 mg/dL 5.65  4.55  4.55   Sodium 135 - 145 mEq/L 137  135  137   Potassium 3.5 - 5.1 mEq/L 4.5  4.4  3.8   Chloride 96 - 112 mEq/L 91  91  92   CO2 19 - 32 mEq/L 31  34  35   Calcium 8.4 - 10.5 mg/dL 8.9  9.3  9.0   Total Protein 6.0 - 8.3 g/dL 8.5  7.7  7.7   Total Bilirubin 0.2 - 1.2 mg/dL 0.7  0.8  0.6   Alkaline Phos 39 - 117 U/L 107  78  73   AST 0 - 37 U/L '20  15  15   '$ ALT 0 - 35 U/L '13  6  7      '$ RUQ sonogram 02/28/2022: Gallbladder: No gallstones or wall thickening visualized. No sonographic Murphy sign noted by sonographer.   Common bile duct:Diameter: 4 mm   Liver:No focal lesion identified. The liver demonstrates a coarsened echotexture and a nodular surface contour, consistent with cirrhosis. Within normal limits in parenchymal echogenicity. Portal vein is patent on color Doppler imaging with normal direction of blood flow towards the liver.   IMPRESSION: Cirrhosis. No  focal liver lesions  EGD 10/26/2021: - Normal esophagus. - Erosive gastropathy with no bleeding and no stigmata of recent bleeding. - Normal examined duodenum. - No specimens collected.  Colonoscopy 10/26/2021: - The examined portion of the ileum was normal. - Diverticulosis in the descending colon and in the cecum. - Patent surgical anastomosis. - Internal hemorrhoids. - No specimens collected   Current Medications, Allergies, Past Medical History, Past Surgical History, Family History and Social History were reviewed in [employer] record.   Review of Systems:   Constitutional: Negative for fever, sweats, chills or weight loss.  Respiratory: Negative for shortness of breath.   Cardiovascular: Negative for chest pain, palpitations and leg swelling.  Gastrointestinal: See HPI.  Musculoskeletal: Negative for back pain or muscle aches.  Neurological: Negative for dizziness, headaches or paresthesias.    Physical Exam: There were no vitals taken for this visit. General: Well developed, w   ***female in no acute distress. Head: Normocephalic and atraumatic. Eyes: No scleral icterus. Conjunctiva pink . Ears:  Normal auditory acuity. Mouth: Dentition intact. No ulcers or lesions.  Lungs: Clear throughout to auscultation. Heart: Regular rate and rhythm, no murmur. Abdomen: Soft, nontender and nondistended. No masses or hepatomegaly. Normal bowel sounds x 4 quadrants.  Rectal: *** Musculoskeletal: Symmetrical with no gross deformities. Extremities: No edema. Neurological: Alert oriented x 4. No focal deficits.  Psychological: Alert and cooperative. Normal mood and affect  Assessment and Recommendations: ***

## 2022-03-06 ENCOUNTER — Emergency Department (HOSPITAL_COMMUNITY): Payer: Medicare Other

## 2022-03-06 ENCOUNTER — Encounter: Payer: Self-pay | Admitting: Nurse Practitioner

## 2022-03-06 ENCOUNTER — Ambulatory Visit (INDEPENDENT_AMBULATORY_CARE_PROVIDER_SITE_OTHER): Payer: Medicare Other

## 2022-03-06 ENCOUNTER — Encounter (HOSPITAL_COMMUNITY): Payer: Self-pay

## 2022-03-06 ENCOUNTER — Ambulatory Visit (INDEPENDENT_AMBULATORY_CARE_PROVIDER_SITE_OTHER): Payer: Medicare Other | Admitting: Nurse Practitioner

## 2022-03-06 ENCOUNTER — Other Ambulatory Visit: Payer: Self-pay

## 2022-03-06 ENCOUNTER — Inpatient Hospital Stay (HOSPITAL_COMMUNITY)
Admission: EM | Admit: 2022-03-06 | Discharge: 2022-03-09 | DRG: 391 | Disposition: A | Discharge status: Home / Self Care | Payer: Medicare Other | Source: Ambulatory Visit | Attending: Internal Medicine | Admitting: Internal Medicine

## 2022-03-06 VITALS — BP 122/58 | HR 88 | Ht 59.0 in | Wt 133.4 lb

## 2022-03-06 DIAGNOSIS — K31819 Angiodysplasia of stomach and duodenum without bleeding: Principal | ICD-10-CM | POA: Diagnosis present

## 2022-03-06 DIAGNOSIS — I482 Chronic atrial fibrillation, unspecified: Secondary | ICD-10-CM | POA: Diagnosis not present

## 2022-03-06 DIAGNOSIS — R9431 Abnormal electrocardiogram [ECG] [EKG]: Secondary | ICD-10-CM | POA: Diagnosis not present

## 2022-03-06 DIAGNOSIS — Z7901 Long term (current) use of anticoagulants: Secondary | ICD-10-CM

## 2022-03-06 DIAGNOSIS — Z9049 Acquired absence of other specified parts of digestive tract: Secondary | ICD-10-CM

## 2022-03-06 DIAGNOSIS — Z8619 Personal history of other infectious and parasitic diseases: Secondary | ICD-10-CM

## 2022-03-06 DIAGNOSIS — Z95810 Presence of automatic (implantable) cardiac defibrillator: Secondary | ICD-10-CM

## 2022-03-06 DIAGNOSIS — Z8616 Personal history of COVID-19: Secondary | ICD-10-CM

## 2022-03-06 DIAGNOSIS — Z801 Family history of malignant neoplasm of trachea, bronchus and lung: Secondary | ICD-10-CM

## 2022-03-06 DIAGNOSIS — K7469 Other cirrhosis of liver: Secondary | ICD-10-CM | POA: Diagnosis present

## 2022-03-06 DIAGNOSIS — M898X9 Other specified disorders of bone, unspecified site: Secondary | ICD-10-CM | POA: Diagnosis present

## 2022-03-06 DIAGNOSIS — K3189 Other diseases of stomach and duodenum: Secondary | ICD-10-CM | POA: Diagnosis present

## 2022-03-06 DIAGNOSIS — Z992 Dependence on renal dialysis: Secondary | ICD-10-CM

## 2022-03-06 DIAGNOSIS — Z825 Family history of asthma and other chronic lower respiratory diseases: Secondary | ICD-10-CM

## 2022-03-06 DIAGNOSIS — E871 Hypo-osmolality and hyponatremia: Secondary | ICD-10-CM | POA: Diagnosis present

## 2022-03-06 DIAGNOSIS — K922 Gastrointestinal hemorrhage, unspecified: Secondary | ICD-10-CM | POA: Diagnosis present

## 2022-03-06 DIAGNOSIS — I132 Hypertensive heart and chronic kidney disease with heart failure and with stage 5 chronic kidney disease, or end stage renal disease: Secondary | ICD-10-CM | POA: Diagnosis present

## 2022-03-06 DIAGNOSIS — N186 End stage renal disease: Secondary | ICD-10-CM | POA: Diagnosis present

## 2022-03-06 DIAGNOSIS — I5042 Chronic combined systolic (congestive) and diastolic (congestive) heart failure: Secondary | ICD-10-CM | POA: Diagnosis present

## 2022-03-06 DIAGNOSIS — I4819 Other persistent atrial fibrillation: Secondary | ICD-10-CM | POA: Diagnosis present

## 2022-03-06 DIAGNOSIS — J4489 Other specified chronic obstructive pulmonary disease: Secondary | ICD-10-CM | POA: Diagnosis present

## 2022-03-06 DIAGNOSIS — K5730 Diverticulosis of large intestine without perforation or abscess without bleeding: Secondary | ICD-10-CM | POA: Diagnosis present

## 2022-03-06 DIAGNOSIS — E039 Hypothyroidism, unspecified: Secondary | ICD-10-CM | POA: Diagnosis present

## 2022-03-06 DIAGNOSIS — D631 Anemia in chronic kidney disease: Secondary | ICD-10-CM | POA: Diagnosis present

## 2022-03-06 DIAGNOSIS — Z8249 Family history of ischemic heart disease and other diseases of the circulatory system: Secondary | ICD-10-CM

## 2022-03-06 DIAGNOSIS — K921 Melena: Secondary | ICD-10-CM | POA: Diagnosis present

## 2022-03-06 DIAGNOSIS — N2581 Secondary hyperparathyroidism of renal origin: Secondary | ICD-10-CM | POA: Diagnosis present

## 2022-03-06 DIAGNOSIS — D62 Acute posthemorrhagic anemia: Secondary | ICD-10-CM | POA: Diagnosis present

## 2022-03-06 DIAGNOSIS — Z823 Family history of stroke: Secondary | ICD-10-CM

## 2022-03-06 DIAGNOSIS — K625 Hemorrhage of anus and rectum: Principal | ICD-10-CM

## 2022-03-06 DIAGNOSIS — E1169 Type 2 diabetes mellitus with other specified complication: Secondary | ICD-10-CM | POA: Diagnosis present

## 2022-03-06 DIAGNOSIS — Z8719 Personal history of other diseases of the digestive system: Secondary | ICD-10-CM

## 2022-03-06 DIAGNOSIS — I509 Heart failure, unspecified: Secondary | ICD-10-CM | POA: Diagnosis not present

## 2022-03-06 DIAGNOSIS — Z79899 Other long term (current) drug therapy: Secondary | ICD-10-CM

## 2022-03-06 DIAGNOSIS — E1122 Type 2 diabetes mellitus with diabetic chronic kidney disease: Secondary | ICD-10-CM | POA: Diagnosis present

## 2022-03-06 DIAGNOSIS — D649 Anemia, unspecified: Secondary | ICD-10-CM

## 2022-03-06 DIAGNOSIS — I428 Other cardiomyopathies: Secondary | ICD-10-CM | POA: Diagnosis present

## 2022-03-06 DIAGNOSIS — I152 Hypertension secondary to endocrine disorders: Secondary | ICD-10-CM | POA: Diagnosis present

## 2022-03-06 DIAGNOSIS — Z79890 Hormone replacement therapy (postmenopausal): Secondary | ICD-10-CM

## 2022-03-06 DIAGNOSIS — M8580 Other specified disorders of bone density and structure, unspecified site: Secondary | ICD-10-CM | POA: Diagnosis present

## 2022-03-06 DIAGNOSIS — K648 Other hemorrhoids: Secondary | ICD-10-CM | POA: Diagnosis present

## 2022-03-06 LAB — COMPREHENSIVE METABOLIC PANEL
ALT: 9 U/L (ref 0–44)
AST: 20 U/L (ref 15–41)
Albumin: 3.7 g/dL (ref 3.5–5.0)
Alkaline Phosphatase: 81 U/L (ref 38–126)
Anion gap: 15 (ref 5–15)
BUN: 45 mg/dL — ABNORMAL HIGH (ref 8–23)
CO2: 27 mmol/L (ref 22–32)
Calcium: 8.1 mg/dL — ABNORMAL LOW (ref 8.9–10.3)
Chloride: 92 mmol/L — ABNORMAL LOW (ref 98–111)
Creatinine, Ser: 7.83 mg/dL — ABNORMAL HIGH (ref 0.44–1.00)
GFR, Estimated: 5 mL/min — ABNORMAL LOW (ref >60)
Glucose, Bld: 104 mg/dL — ABNORMAL HIGH (ref 70–99)
Potassium: 4.9 mmol/L (ref 3.5–5.1)
Sodium: 134 mmol/L — ABNORMAL LOW (ref 135–145)
Total Bilirubin: 0.7 mg/dL (ref 0.3–1.2)
Total Protein: 8.2 g/dL — ABNORMAL HIGH (ref 6.5–8.1)

## 2022-03-06 LAB — CBC WITH DIFFERENTIAL/PLATELET
Abs Immature Granulocytes: 0.06 10*3/uL (ref 0.00–0.07)
Basophils Absolute: 0.1 10*3/uL (ref 0.0–0.1)
Basophils Relative: 1 %
Eosinophils Absolute: 0.2 10*3/uL (ref 0.0–0.5)
Eosinophils Relative: 3 %
HCT: 26.7 % — ABNORMAL LOW (ref 36.0–46.0)
Hemoglobin: 8.5 g/dL — ABNORMAL LOW (ref 12.0–15.0)
Immature Granulocytes: 1 %
Lymphocytes Relative: 22 %
Lymphs Abs: 1.6 10*3/uL (ref 0.7–4.0)
MCH: 30.7 pg (ref 26.0–34.0)
MCHC: 31.8 g/dL (ref 30.0–36.0)
MCV: 96.4 fL (ref 80.0–100.0)
Monocytes Absolute: 0.8 10*3/uL (ref 0.1–1.0)
Monocytes Relative: 11 %
Neutro Abs: 4.6 10*3/uL (ref 1.7–7.7)
Neutrophils Relative %: 62 %
Platelets: 154 10*3/uL (ref 150–400)
RBC: 2.77 MIL/uL — ABNORMAL LOW (ref 3.87–5.11)
RDW: 19.7 % — ABNORMAL HIGH (ref 11.5–15.5)
WBC: 7.2 10*3/uL (ref 4.0–10.5)
nRBC: 0 % (ref 0.0–0.2)

## 2022-03-06 LAB — TYPE AND SCREEN
ABO/RH(D): O POS
Antibody Screen: NEGATIVE

## 2022-03-06 LAB — POC OCCULT BLOOD, ED: Fecal Occult Bld: POSITIVE — AB

## 2022-03-06 MED ORDER — PANTOPRAZOLE SODIUM 40 MG IV SOLR
40.0000 mg | Freq: Two times a day (BID) | INTRAVENOUS | Status: DC
  Administered 2022-03-07 (×3): 40 mg via INTRAVENOUS
  Filled 2022-03-06 (×4): qty 10

## 2022-03-06 NOTE — Patient Instructions (Signed)
Go to ER for further evaulation.  It was a pleasure to see you today!  Thank you for trusting me with your gastrointestinal care!

## 2022-03-06 NOTE — ED Provider Triage Note (Signed)
Emergency Medicine Provider Triage Evaluation Note  Morgan Clay , a 76 y.o. female  was evaluated in triage.  Pt complains of GI bleed.  Sent in from [place] office.  She has had persistent bright red blood and dark stool with clots x1 week.  ESRD on dialysis 1 day Wednesday Friday.  Supposed to go today. + sob, weakness  Review of Systems  Positive: melena Negative: fever  Physical Exam  BP (!) 155/43 (BP Location: Right Arm)   Pulse 80   Temp 97.8 F (36.6 C) (Oral)   Resp 18   Ht '4\' 11"'$  (1.499 m)   Wt 60.3 kg   SpO2 100%   BMI 26.86 kg/m  Gen:   Awake, no distress   Resp:  Normal effort  MSK:   Moves extremities without difficulty  Other:  No abd tenderness  Medical Decision Making  Medically screening exam initiated at 11:46 AM.  Appropriate orders placed.  Caliann Leckrone was informed that the remainder of the evaluation will be completed by another provider, this initial triage assessment does not replace that evaluation, and the importance of remaining in the ED until their evaluation is complete.  Work up initiated   Margarita Mail, PA-C 03/06/22 1151

## 2022-03-06 NOTE — Progress Notes (Addendum)
Daily Rounding Note  03/06/2022, 4:16 PM  LOS: 0 days   SUBJECTIVE:   Chief complaint:   black stools w BRB, dark blood   Seen by GI APP in office, now in ED.   Black and bloody stools w mild pain in mid abdomen, 4 to 5 occurrences daily for ~ 5 days.  No N/V and eating solids, great appetite.      OBJECTIVE:         Vital signs in last 24 hours:    Temp:  [97.8 F (36.6 C)-98.5 F (36.9 C)] 98.5 F (36.9 C) (10/30 1533) Pulse Rate:  [78-88] 82 (10/30 1600) Resp:  [16-23] 17 (10/30 1600) BP: (122-155)/(43-71) 153/71 (10/30 1600) SpO2:  [97 %-100 %] 99 % (10/30 1600) Weight:  [60.3 kg-60.5 kg] 60.3 kg (10/30 1141)   Filed Weights   03/06/22 1141  Weight: 60.3 kg   General: NAD.  Looks mildly chronically ill but comfortable, alert. Heart: RRR. Chest: Raspy vocal quality.  Lungs clear bilaterally. Abdomen: Soft.  Mid abdominal tenderness without guarding or rebound.  No HSM, masses, bruits, hernias. Extremities: No CCE. Neuro/Psych: Alert.  Oriented x3.  Moves all 4 limbs.  Intake/Output from previous day: No intake/output data recorded.  Intake/Output this shift: No intake/output data recorded.  Lab Results: Recent Labs    03/06/22 1159  WBC 7.2  HGB 8.5*  HCT 26.7*  PLT 154   BMET Recent Labs    03/06/22 1159  NA 134*  K 4.9  CL 92*  CO2 27  GLUCOSE 104*  BUN 45*  CREATININE 7.83*  CALCIUM 8.1*   LFT Recent Labs    03/06/22 1159  PROT 8.2*  ALBUMIN 3.7  AST 20  ALT 9  ALKPHOS 81  BILITOT 0.7   PT/INR No results for input(s): "LABPROT", "INR" in the last 72 hours. Hepatitis Panel No results for input(s): "HEPBSAG", "HCVAB", "HEPAIGM", "HEPBIGM" in the last 72 hours.  Studies/Results: No results found.  ASSESMENT:     GI bleeding.  Black stools, BPR.  Latest EGD and colonoscopy were in June 2023.  Had erosive gastropathy, diverticulosis, nonbleeding internal hemorrhoids.   Diverticular bleed suspected.  Previous EGDs, colonoscopy, VCE, SBE with findings of prepyloric AVMs treated with APC.  Has not been taking PPI.    ABL anemia.  MCV WNL.  Current Hb 8.5, this is within range from measurements in June but she had gotten as high as 13.3 in late August.  In past received parenteral iron but none recently.  ESRD.  HD MWF.  Cirrhosis of the liver, compensated.  Eradicated hep C  (Harvoni ).  follows w NP Drazek at [facility].    Chronic Eliquis.  Last dose was this morning 10/30.  For history of A-fib/flutter.  Also has nonischemic cardiomyopathy and is status post biventricular pacemaker/ICD placement.   PLAN   Okay to have clear liquids.  Will start Protonix 40 mg IV bid.  Okay for clear liquids.  Suspect Dr. Lyndel Safe will want to pursue EGD but he has yet to see the patient.  Assume patient will end up having her hemodialysis session this afternoon/evening.  Awaiting renal consult.    Morgan Clay  03/06/2022, 4:16 PM Phone [phone]    Attending physician's note   I have taken history, reviewed the chart and examined the patient. I performed a substantive portion of this encounter, including complete performance of at least one of the key  components, in conjunction with the APP. I agree with the Advanced Practitioner's note, impression and recommendations.   Reasonable to proceed with repeat EGD (with possible APC) after Eliquis washout Nov 1 IV Protonix Trend CBC, INR Okay for clear liquids   Carmell Austria, MD [facility] [phone]

## 2022-03-06 NOTE — H&P (Signed)
History and Physical    Patient: Morgan Clay VQQ:[id number] DOB: Jul 28, 1945 DOA: 03/06/2022 DOS: the patient was seen and examined on 03/06/2022 PCP: Mackie Pai, PA-C  Patient coming from: Home  Chief Complaint:  Chief Complaint  Patient presents with   Rectal Bleeding   HPI: Morgan Clay is a 76 y.o. female with medical history significant of atrial fibrillation on Eliquis, ESRD on HD (MWF), nonischemic cardiomyopathy s/p ICD placement/biventricular pacemaker (last echo on 12/22/2021 showed LVEF of 50 to 55%), CHF, history of recurrent GI bleeds due to gastric/small bowel AVMs and diverticular bleed who presents to the emergency department from gastroenterology office this morning.  Patient complained of mild abdominal pain with black and bloody stools which started on 02/28/2022, she endorsed 4-5 episodes daily.  She complained of lightheadedness, but denies use of Pepto-Bismol, and iron tablets, palpitations, shortness of breath.  Last Eliquis was taken at 7:30 AM this morning  ED Course:  In the emergency department, she was hemodynamically stable, BP was 148/66 and other vital signs were within normal range.  Work-up in the ED showed WBC 7.2, H/H 8.5/26.7, this was 13.3/41.2 on 12/29/2021.,  Sodium 134, potassium 4.5, chloride 92, bicarb 27, glucose 104, BUN/creatinine 45/1.83, FOBT was positive. Chest x-ray showed mild cardiomegaly. Peribronchial, septal and interstitial thickening suspicious for pulmonary edema. Possible trace pleural effusions. She was treated with Protonix.  Hospitalist was asked to admit patient for further evaluation and management.  Review of Systems: Review of systems as noted in the HPI. All other systems reviewed and are negative.   Past Medical History:  Diagnosis Date   Acute blood loss anemia 09/24/2020   AICD (automatic cardioverter/defibrillator) present 10/06/2015   Anemia due to chronic blood loss 04/15/2020   Anemia in chronic kidney  disease 01/18/2018   Angiodysplasia of small intestine 06/10/2020   Ileum - seen on capsule endoscopy 05/2020 - ablated at colonoscopy   Aortic atherosclerosis ([place]) 08/30/2017   Arthritis    "qwhere" (01/03/2018)   Asthma    reports mild asthma since childhood - had COPD on dx list from prior PCP   Atrial fibrillation with RVR ([place]) 11/17/2020   Atrial fibrillation, chronic 10/25/2017   Atypical atrial flutter ([place]) 09/28/2015   AVM (arteriovenous malformation) of small bowel, acquired 01/14/2018   Biventricular ICD (implantable cardioverter-defibrillator) in place 01/08/2007   Qualifier: Diagnosis of  By: Morgan Clay, Morgan Clay     Bleeding pseudoaneurysm of left brachiocephalic arteriovenous fistula ([place]) 09/24/2020   Bleeding pseudoaneurysm of left brachiocephalic AV fistula ([place]) 09/24/2020   Carpal tunnel syndrome, bilateral 06/09/2021   Chronic bronchitis ([place])    "get it most years; not this past year though" (01/03/2018)   Chronic diastolic CHF (congestive heart failure) ([place]) 04/24/2007   Annotation: secondary to nonischemic cardiomyopathy s/p CRT-D Followed by Dr. 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 (HCV) (Morgan Clay)    HEPATITIS C - s/p treatment with Harvoni, saw hepatology, Morgan Clay   COVID-19 virus infection 05/27/2020   Diabetes mellitus without complication [facility])    DIET CONTROLLED    Dialysis complication    Diverticulitis of left colon status post robotic low anterior to sigmoid resection 05/22/2018 07/31/2017   End stage renal disease (Morgan Clay) 03/22/2021   FIBROIDS,  UTERUS 03/05/2008   Glaucoma    Gout    History of blood transfusion ~ 11/2017   Hypertension associated with diabetes ([place]) 06/07/2017   Hypothyroidism     LBBB (left bundle branch block)    Lesion of ulnar nerve 06/09/2021   Long term current use of anticoagulant    Lower GI bleeding    "been dealing w/it since 07/2017" (01/03/2018)   Malnutrition of moderate degree 07/09/2020   MDD (major depressive disorder), recurrent, in partial remission ([place]) 06/07/2017   Nonischemic cardiomyopathy ([place])    Obesity 05/27/2009   Qualifier: Diagnosis of  By: Morgan Clay, Morgan Clay     OBSTRUCTIVE SLEEP APNEA 11/14/2007   no CPAP   OSTEOPENIA 09/30/2008   Other cirrhosis of liver ([place]) 08/12/2020   Formatting of this note is different from the original. Patient with history of cirrhosis secondary to hepatitis C with no history of hepatic decompensation.  She has had some hyponatremia and hypoalbuminemia in the past however most recent labs show relatively well-preserved liver synthetic function.  She will have labs to update liver synthetic function.  Hepatoma screening - patient is currentl   Overweight (BMI 25.0-29.9) 03/02/2020   Pain in right knee 05/13/2018   Persistent atrial fibrillation ([place]) 02/06/2020   Pneumonia    "several times" (01/03/2018)   Polyneuropathy associated with underlying disease ([place]) 06/09/2021   Pulmonary hypertension ([place]) on echocardiogram 01/14/2018   Secondary esophageal varices without bleeding ([place]) 12/23/2020   Formatting of this note might be different from the original. Patient had trace esophageal varices during small bowel enteroscopy in October 2021, follow-up EGD in January 2022 with no varices.  She previously had GI bleeding thought to be from AVM versus GAVE.  She is no longer on carvedilol and will need surveillance endoscopy.  Patient was previously followed at [facility] and I have asked her    Secondary hypercoagulable state ([place]) 02/06/2020   Type 2 diabetes, controlled, with renal manifestation ([place]) 11/24/2013   Past Surgical History:  Procedure Laterality Date   A/V FISTULAGRAM N/A 03/28/2021   Procedure:  A/V FISTULAGRAM;  Surgeon: Waynetta Sandy, MD;  Location: [place] [facility];  Service: Cardiovascular;  Laterality: N/A;   APPENDECTOMY     AV FISTULA PLACEMENT Left 08/05/2020   Procedure: LEFT UPPER EXTREMITY ARTERIOVENOUS (AV) FISTULA CREATION;  Surgeon: Cherre Robins, MD;  Location: [place];  Service: Vascular;  Laterality: Left;   [place] Left 09/16/2020   Procedure: LEFT SECOND STAGE [place];  Surgeon: Cherre Robins, MD;  Location: [place];  Service: Vascular;  Laterality: Left;  PERIPHERAL NERVE BLOCK   CARDIAC CATHETERIZATION     CARDIOVERSION N/A 12/14/2014   Procedure: CARDIOVERSION;  Surgeon: Dorothy Spark, MD;  Location: [place];  Service: Cardiovascular;  Laterality: N/A;   CARDIOVERSION N/A 02/26/2017   Procedure: CARDIOVERSION;  Surgeon: Evans Lance, MD;  Location: [place] [facility];  Service: Cardiovascular;  Laterality: N/A;   CARDIOVERSION N/A 07/24/2017   Procedure: CARDIOVERSION;  Surgeon: Acie Fredrickson Wonda Cheng, MD;  Location: [facility] ENDOSCOPY;  Service: Cardiovascular;  Laterality: N/A;   CARDIOVERSION N/A 02/12/2020   Procedure: CARDIOVERSION;  Surgeon: [name], MD;  Location: [facility] ENDOSCOPY;  Service: Cardiovascular;  Laterality: N/A;   CARDIOVERSION N/A 09/15/2021   Procedure: CARDIOVERSION;  Surgeon: [name], MD;  Location: [facility] ENDOSCOPY;  Service: Cardiovascular;  Laterality: N/A;   CARPAL TUNNEL RELEASE  07/21/2021   COLONOSCOPY N/A 11/08/2017  Procedure: COLONOSCOPY;  Surgeon: Milus Banister, MD;  Location: [facility];  Service: Endoscopy;  Laterality: N/A;   COLONOSCOPY WITH PROPOFOL N/A 05/31/2020   Procedure: COLONOSCOPY WITH PROPOFOL;  Surgeon: Irene Shipper, MD;  Location: [facility] ENDOSCOPY;  Service: Endoscopy;  Laterality: N/A;   COLONOSCOPY WITH PROPOFOL N/A 10/26/2021   Procedure: COLONOSCOPY WITH PROPOFOL;  Surgeon: Sharyn Creamer, MD;  Location: [place];  Service:  Gastroenterology;  Laterality: N/A;   DILATION AND CURETTAGE OF UTERUS     ENTEROSCOPY N/A 03/02/2020   Procedure: ENTEROSCOPY;  Surgeon: Yetta Flock, MD;  Location: [facility] ENDOSCOPY;  Service: Gastroenterology;  Laterality: N/A;   EP IMPLANTABLE DEVICE N/A 10/06/2015   Procedure: BIV ICD Generator Changeout;  Surgeon: [name], MD;  Location: [place] [facility];  Service: Cardiovascular;  Laterality: N/A;   ESOPHAGOGASTRODUODENOSCOPY N/A 10/26/2017   Procedure: ESOPHAGOGASTRODUODENOSCOPY (EGD);  Surgeon: Ladene Artist, MD;  Location: [facility];  Service: Endoscopy;  Laterality: N/A;   ESOPHAGOGASTRODUODENOSCOPY (EGD) WITH PROPOFOL N/A 11/07/2017   Procedure: ESOPHAGOGASTRODUODENOSCOPY (EGD) WITH PROPOFOL;  Surgeon: Milus Banister, MD;  Location: [facility] ENDOSCOPY;  Service: Endoscopy;  Laterality: N/A;   ESOPHAGOGASTRODUODENOSCOPY (EGD) WITH PROPOFOL N/A 05/29/2020   Procedure: ESOPHAGOGASTRODUODENOSCOPY (EGD) WITH PROPOFOL;  Surgeon: Doran Stabler, MD;  Location: [place];  Service: Gastroenterology;  Laterality: N/A;   ESOPHAGOGASTRODUODENOSCOPY (EGD) WITH PROPOFOL N/A 10/26/2021   Procedure: ESOPHAGOGASTRODUODENOSCOPY (EGD) WITH PROPOFOL;  Surgeon: Sharyn Creamer, MD;  Location: [place];  Service: Gastroenterology;  Laterality: N/A;   GIVENS CAPSULE STUDY N/A 05/19/2020   Procedure: GIVENS CAPSULE STUDY;  Surgeon: [name], MD;  Location: [place];  Service: Endoscopy;  Laterality: N/A;  .adm for obs since pacemaker, PA wil enter order and see pt   GIVENS CAPSULE STUDY N/A 05/29/2020   Procedure: GIVENS CAPSULE STUDY;  Surgeon: Doran Stabler, MD;  Location: [place];  Service: Gastroenterology;  Laterality: N/A;   HEMATOMA EVACUATION Left 09/24/2020   Procedure: EVACUATION HEMATOMA ARM;  Surgeon: Rosetta Posner, MD;  Location: [facility] OR;  Service: Vascular;  Laterality: Left;   HOT HEMOSTASIS N/A 10/26/2017   Procedure: HOT HEMOSTASIS (ARGON PLASMA  COAGULATION/BICAP);  Surgeon: Ladene Artist, MD;  Location: [facility];  Service: Endoscopy;  Laterality: N/A;   HOT HEMOSTASIS N/A 03/02/2020   Procedure: HOT HEMOSTASIS (ARGON PLASMA COAGULATION/BICAP);  Surgeon: Yetta Flock, MD;  Location: [facility] ENDOSCOPY;  Service: Gastroenterology;  Laterality: N/A;   HOT HEMOSTASIS N/A 05/31/2020   Procedure: HOT HEMOSTASIS (ARGON PLASMA COAGULATION/BICAP);  Surgeon: Irene Shipper, MD;  Location: [facility] ENDOSCOPY;  Service: Endoscopy;  Laterality: N/A;   INSERT / REPLACE / REMOVE PACEMAKER  ?2008   IR PERC TUN PERIT CATH WO PORT S&I /IMAG  07/05/2020   PERIPHERAL VASCULAR INTERVENTION Left 03/28/2021   Procedure: PERIPHERAL VASCULAR INTERVENTION;  Surgeon: Waynetta Sandy, MD;  Location: [place] [facility];  Service: Cardiovascular;  Laterality: Left;   POLYPECTOMY  11/08/2017   Procedure: POLYPECTOMY;  Surgeon: Milus Banister, MD;  Location: [facility] ENDOSCOPY;  Service: Endoscopy;;   PROCTOSCOPY N/A 05/22/2018   Procedure: RIGID PROCTOSCOPY;  Surgeon: Michael Boston, MD;  Location: [facility];  Service: General;  Laterality: N/A;   RIGHT HEART CATH N/A 02/13/2018   Procedure: RIGHT HEART CATH;  Surgeon: [name], MD;  Location: [place] [facility];  Service: Cardiovascular;  Laterality: N/A;   RIGHT HEART CATH N/A 06/23/2020   Procedure: RIGHT HEART CATH;  Surgeon: [name], MD;  Location: [place] [facility];  Service: Cardiovascular;  Laterality: N/A;   RIGHT/LEFT HEART CATH AND CORONARY ANGIOGRAPHY N/A 12/11/2019   Procedure: RIGHT/LEFT HEART CATH AND CORONARY ANGIOGRAPHY;  Surgeon: [name], MD;  Location: [place] [facility];  Service: Cardiovascular;  Laterality: N/A;   SUBMUCOSAL TATTOO INJECTION  03/02/2020   Procedure: SUBMUCOSAL TATTOO INJECTION;  Surgeon: Yetta Flock, MD;  Location: [facility] ENDOSCOPY;  Service: Gastroenterology;;   SUBMUCOSAL TATTOO INJECTION  05/31/2020   Procedure: SUBMUCOSAL TATTOO  INJECTION;  Surgeon: Irene Shipper, MD;  Location: [place];  Service: Endoscopy;;   TEE WITHOUT CARDIOVERSION N/A 08/02/2021   Procedure: TRANSESOPHAGEAL ECHOCARDIOGRAM (TEE);  Surgeon: [name], MD;  Location: [facility] ENDOSCOPY;  Service: Cardiovascular;  Laterality: N/A;   TUBAL LIGATION     XI ROBOTIC ASSISTED LOWER ANTERIOR RESECTION N/A 05/22/2018   Procedure: XI ROBOTIC ASSISTED SIGMOID COLOECTOMY MOBILIZATION OF SPLENIC FLEXURE, FIREFLY ASSESSMENT OF PERFUSION;  Surgeon: Michael Boston, MD;  Location: [facility];  Service: General;  Laterality: N/A;  ERAS PATHWAY    Social History:  reports that she has never smoked. She has never used smokeless tobacco. She reports that she does not currently use alcohol. She reports that she does not currently use drugs after having used the following drugs: Marijuana.   No Known Allergies  Family History  Problem Relation Age of Onset   Asthma Father    Heart attack Father    Asthma Sister    Lung cancer Sister    Heart attack Mother    Stroke Brother    Colon cancer Neg Hx    Esophageal cancer Neg Hx    Pancreatic cancer Neg Hx    Stomach cancer Neg Hx    Liver disease Neg Hx      Prior to Admission medications   Medication Sig Start Date End Date Taking? Authorizing Provider  amiodarone (PACERONE) 200 MG tablet Take 1 tablet (200 mg total) by mouth daily. 11/10/21  Yes Saguier, Percell Miller, PA-C  cyclobenzaprine (FLEXERIL) 5 MG tablet Take 5 mg by mouth at bedtime as needed for muscle spasms. 12/07/21  Yes [provider]  diazepam (VALIUM) 5 MG tablet 1 tab po bid prn anxiety Patient taking differently: Take 5 mg by mouth 2 (two) times daily as needed for anxiety. 01/19/22  Yes Saguier, Percell Miller, PA-C  ELIQUIS 2.5 MG TABS tablet Take 1 tablet by mouth twice daily 12/21/21  Yes [name], MD  gabapentin (NEURONTIN) 300 MG capsule Take 1 capsule (300 mg total) by mouth daily. Fill one rx. Computer may have sent over 2 Patient  taking differently: Take 300 mg by mouth daily as needed (pain). 01/10/22  Yes Saguier, Percell Miller, PA-C  hydrOXYzine (ATARAX) 10 MG tablet 1 tab po q hs prn itching Patient taking differently: Take 10 mg by mouth at bedtime as needed for itching. 02/07/22  Yes Saguier, Percell Miller, PA-C  levothyroxine (SYNTHROID) 125 MCG tablet Take 1 tablet (125 mcg total) by mouth daily before breakfast. 01/16/22  Yes Saguier, Percell Miller, PA-C  lidocaine-prilocaine (EMLA) cream Apply 1 Application topically 3 (three) times a week. 09/08/21  Yes [provider]  nitroGLYCERIN (NITROSTAT) 0.4 MG SL tablet Place 1 tablet (0.4 mg total) under the tongue every 5 (five) minutes as needed for chest pain. 10/27/21  Yes Thurnell Lose, MD  traZODone (DESYREL) 50 MG tablet Take 0.5-1 tablets (25-50 mg total) by mouth at bedtime as needed for sleep. 12/16/20  Yes Saguier, Percell Miller, PA-C  XIIDRA 5 % SOLN Place 1 drop into both eyes at bedtime. 11/23/21  Yes [provider]  pantoprazole (PROTONIX) 40 MG tablet Take 1 tablet (40 mg total) by mouth 2 (two) times daily before a meal. Patient not taking: Reported on 03/06/2022 10/27/21   Thurnell Lose, MD  rOPINIRole (REQUIP) 0.25 MG tablet Take 0.25 mg by mouth at bedtime. Patient not taking: Reported on 03/06/2022 12/08/21   [provider]    Physical Exam: BP (!) 143/69   Pulse 82   Temp 98.5 F (36.9 C) (Oral)   Resp 11   Ht '4\' 11"'$  (1.499 m)   Wt 60.3 kg   SpO2 99%   BMI 26.86 kg/m   General: 76 y.o. year-old female well developed well nourished in no acute distress.  Alert and oriented x3. HEENT: NCAT, EOMI Neck: Supple, trachea medial Cardiovascular: Irregular rate and rhythm with no rubs or gallops.  No thyromegaly or JVD noted.  No lower extremity edema. 2/4 pulses in all 4 extremities. Respiratory: Clear to auscultation with no wheezes or rales. Good inspiratory effort. Abdomen: Soft, tender to palpation of LLQ with no guarding.  Normal bowel  sounds x4 quadrants. Muskuloskeletal: No cyanosis, clubbing or edema noted bilaterally Neuro: CN II-XII intact, strength 5/5 x 4, sensation, reflexes intact Skin: No ulcerative lesions noted or rashes Psychiatry: Judgement and insight appear normal. Mood is appropriate for condition and setting          Labs on Admission:  Basic Metabolic Panel: Recent Labs  Lab 03/06/22 1159  NA 134*  K 4.9  CL 92*  CO2 27  GLUCOSE 104*  BUN 45*  CREATININE 7.83*  CALCIUM 8.1*   Liver Function Tests: Recent Labs  Lab 03/06/22 1159  AST 20  ALT 9  ALKPHOS 81  BILITOT 0.7  PROT 8.2*  ALBUMIN 3.7   No results for input(s): "LIPASE", "AMYLASE" in the last 168 hours. No results for input(s): "AMMONIA" in the last 168 hours. CBC: Recent Labs  Lab 03/06/22 1159  WBC 7.2  NEUTROABS 4.6  HGB 8.5*  HCT 26.7*  MCV 96.4  PLT 154   Cardiac Enzymes: No results for input(s): "CKTOTAL", "CKMB", "CKMBINDEX", "TROPONINI" in the last 168 hours.  BNP (last 3 results) No results for input(s): "BNP" in the last 8760 hours.  ProBNP (last 3 results) Recent Labs    11/10/21 1151 11/17/21 1033  PROBNP 1,656.0* 2,020.0*    CBG: No results for input(s): "GLUCAP" in the last 168 hours.  Radiological Exams on Admission: DG Chest 2 View  Result Date: 03/06/2022 CLINICAL DATA:  Shortness of breath. History of asthma, hypertension and CHF. EXAM: CHEST - 2 VIEW COMPARISON:  12/29/2021 FINDINGS: Multilead left-sided pacemaker. Mild cardiomegaly. Peribronchial, septal and interstitial thickening suspicious for pulmonary edema. Subsegmental atelectasis or scar at the right lung base. There may be trace pleural effusions. No pneumothorax. On limited assessment, no acute osseous findings. IMPRESSION: Mild cardiomegaly. Peribronchial, septal and interstitial thickening suspicious for pulmonary edema. Possible trace pleural effusions. Electronically Signed   By: Keith Rake M.D.   On: 03/06/2022 16:51     EKG: I independently viewed the EKG done and my findings are as followed: Atrial sensed ventricular paced rhythm at a rate of 82 bpm with QTc 563 ms  Assessment/Plan Present on Admission:  GI bleed  Atrial fibrillation, chronic  AICD (automatic cardioverter/defibrillator) present  Principal Problem:   GI bleed Active Problems:   Atrial fibrillation,  chronic   Acquired hypothyroidism   ESRD on dialysis [facility])   AICD (automatic cardioverter/defibrillator) present  Acute GI bleed Chronic anemia secondary to chronic GI blood loss and ESRD H/H= H/H 8.5/26.7, this was 13.3/41.2 on 12/29/2021 Hemoccult was positive Continue Protonix  Patient was seen in the office this morning by gastroenterology and she was sent to the ED for further evaluation and management. Gastroenterologist already following with the patient.  We shall await further recommendations  A-fib on Eliquis Last dose of Eliquis was this morning, this will be temporarily held at this time in anticipation for possible EGD/colonoscopy. Continue amiodarone  ESRD on HD (MWF) Patient missed dialysis today due to being in doctor's office and being asked to go to the ED for further evaluation and management Nephrologist (Dr. Melvia Heaps) was consulted and will follow-up with patient in the morning by ED PA  Prolonged QTc 563 ms Note that patient has AICD Avoid QT prolonging drugs Magnesium level will be checked Continue telemetry   Acquired hypothyroidism Continue Synthroid  DVT prophylaxis: SCDs  Code Status: Full code  Consults: Gastroenterology, nephrology  Family Communication: None at bedside  Severity of Illness: The appropriate patient status for this patient is INPATIENT. Inpatient status is judged to be reasonable and necessary in order to provide the required intensity of service to ensure the patient's safety. The patient's presenting symptoms, physical exam findings, and initial radiographic and laboratory  data in the context of their chronic comorbidities is felt to place them at high risk for further clinical deterioration. Furthermore, it is not anticipated that the patient will be medically stable for discharge from the hospital within 2 midnights of admission.   * I certify that at the point of admission it is my clinical judgment that the patient will require inpatient hospital care spanning beyond 2 midnights from the point of admission due to high intensity of service, high risk for further deterioration and high frequency of surveillance required.*  Author: Bernadette Hoit, DO 03/06/2022 6:13 PM  For on call review [email or web address].

## 2022-03-06 NOTE — ED Provider Notes (Signed)
[facility] EMERGENCY DEPARTMENT Provider Note   CSN: [id number] Arrival date & time: 03/06/22  1114     History  Chief Complaint  Patient presents with   Rectal Bleeding    Morgan Clay is a 76 y.o. female.  The history is provided by the patient and medical records. No language interpreter was used.  Rectal Bleeding    Morgan Clay is a 61y female with a history of ESRD requiring dialysis M/W/F, COPD, diverticulitis, atrial fibrillation with anticoagulation, hep C cirrhosis, CHF with ICD, T2DM, sigmoid colectomy, GI bleeding here today with 1 week of rectal bleeding with red blood and dark stools, and diffuse abdominal pain 7/10 sent after being seen by GI. She does admit feeling a little light headed for the last week but denies falls.  She has had several admissions for GI bleeding, last was 10/24/21. EGD, colonoscopy noted gastropathy without bleeding, diverticular bleed suspected.   She is ESRD requiring dialysis M/W/F. Last dialysis was Friday- 3 days ago, she was scheduled for dialysis today which did not occur due to ER visit.   She is taking eliquis for atrial fibrillation, last taken this morning.   Home Medications Prior to Admission medications   Medication Sig Start Date End Date Taking? Authorizing Provider  amiodarone (PACERONE) 200 MG tablet Take 1 tablet (200 mg total) by mouth daily. 11/10/21   Saguier, Percell Miller, PA-C  AURYXIA 1 GM 210 MG(Fe) tablet Take 420 mg by mouth 3 (three) times daily. 01/13/22   [provider]  cyclobenzaprine (FLEXERIL) 5 MG tablet Take 5 mg by mouth at bedtime as needed. 12/07/21   [provider]  diazepam (VALIUM) 5 MG tablet 1 tab po bid prn anxiety 01/19/22   Saguier, Percell Miller, PA-C  ELIQUIS 2.5 MG TABS tablet Take 1 tablet by mouth twice daily 12/21/21   [name], MD  gabapentin (NEURONTIN) 300 MG capsule Take 1 capsule (300 mg total) by mouth daily. Fill one rx. Computer may have sent over 2  01/10/22   Saguier, Percell Miller, PA-C  hydrOXYzine (ATARAX) 10 MG tablet 1 tab po q hs prn itching 02/07/22   Saguier, Percell Miller, PA-C  levothyroxine (SYNTHROID) 125 MCG tablet Take 1 tablet (125 mcg total) by mouth daily before breakfast. 01/16/22   Saguier, Percell Miller, PA-C  lidocaine-prilocaine (EMLA) cream 1 application. 3 (three) times a week. 09/08/21   [provider]  nitroGLYCERIN (NITROSTAT) 0.4 MG SL tablet Place 1 tablet (0.4 mg total) under the tongue every 5 (five) minutes as needed for chest pain. 10/27/21   Thurnell Lose, MD  pantoprazole (PROTONIX) 40 MG tablet Take 1 tablet (40 mg total) by mouth 2 (two) times daily before a meal. 10/27/21   Thurnell Lose, MD  rOPINIRole (REQUIP) 0.25 MG tablet Take 0.25 mg by mouth at bedtime. 12/08/21   [provider]  traZODone (DESYREL) 50 MG tablet Take 0.5-1 tablets (25-50 mg total) by mouth at bedtime as needed for sleep. 12/16/20   Saguier, Percell Miller, PA-C  XIIDRA 5 % SOLN  11/23/21   [provider]      Allergies    Patient has no known allergies.    Review of Systems   Review of Systems  Gastrointestinal:  Positive for hematochezia.  All other systems reviewed and are negative.   Physical Exam Updated Vital Signs BP (!) 148/66 (BP Location: Right Arm)   Pulse 78   Temp 97.9 F (36.6 C) (Oral)   Resp 16  Ht '4\' 11"'$  (1.499 m)   Wt 60.3 kg   SpO2 99%   BMI 26.86 kg/m  Physical Exam Vitals and nursing note reviewed.  Constitutional:      General: She is not in acute distress.    Appearance: She is well-developed.  HENT:     Head: Atraumatic.  Eyes:     Conjunctiva/sclera: Conjunctivae normal.  Cardiovascular:     Rate and Rhythm: Rhythm irregular.  Pulmonary:     Effort: Pulmonary effort is normal.     Breath sounds: Rales present.  Abdominal:     Palpations: Abdomen is soft.     Tenderness: There is abdominal tenderness (Mild tenderness to epigastric and left lower quadrant on palpation no guarding or  rebound tenderness.).  Genitourinary:    Comments: Chaperone present during exam.  Normal rectal tone, no obvious mass, no thrombosed hemorrhoid, dark color stool on glove with minimal bright red blood noted. Musculoskeletal:     Cervical back: Neck supple.  Skin:    Findings: No rash.  Neurological:     Mental Status: She is alert.  Psychiatric:        Mood and Affect: Mood normal.     ED Results / Procedures / Treatments   Labs (all labs ordered are listed, but only abnormal results are displayed) Labs Reviewed  COMPREHENSIVE METABOLIC PANEL - Abnormal; Notable for the following components:      Result Value   Sodium 134 (*)    Chloride 92 (*)    Glucose, Bld 104 (*)    BUN 45 (*)    Creatinine, Ser 7.83 (*)    Calcium 8.1 (*)    Total Protein 8.2 (*)    GFR, Estimated 5 (*)    All other components within normal limits  CBC WITH DIFFERENTIAL/PLATELET - Abnormal; Notable for the following components:   RBC 2.77 (*)    Hemoglobin 8.5 (*)    HCT 26.7 (*)    RDW 19.7 (*)    All other components within normal limits  POC OCCULT BLOOD, ED - Abnormal; Notable for the following components:   Fecal Occult Bld POSITIVE (*)    All other components within normal limits  CBC  TYPE AND SCREEN    EKG None ED ECG REPORT   Date: 03/06/2022  Rate: 82  Rhythm:  atrial-sensed ventricular-paced rhythm  QRS Axis: left  Intervals: QT prolonged  ST/T Wave abnormalities: normal  Conduction Disutrbances:left bundle branch block  Narrative Interpretation:   Old EKG Reviewed: unchanged  I have personally reviewed the EKG tracing and agree with the computerized printout as noted.   Radiology DG Chest 2 View  Result Date: 03/06/2022 CLINICAL DATA:  Shortness of breath. History of asthma, hypertension and CHF. EXAM: CHEST - 2 VIEW COMPARISON:  12/29/2021 FINDINGS: Multilead left-sided pacemaker. Mild cardiomegaly. Peribronchial, septal and interstitial thickening suspicious for  pulmonary edema. Subsegmental atelectasis or scar at the right lung base. There may be trace pleural effusions. No pneumothorax. On limited assessment, no acute osseous findings. IMPRESSION: Mild cardiomegaly. Peribronchial, septal and interstitial thickening suspicious for pulmonary edema. Possible trace pleural effusions. Electronically Signed   By: Keith Rake M.D.   On: 03/06/2022 16:51    Procedures .Critical Care  Performed by: Domenic Moras, PA-C Authorized by: Domenic Moras, PA-C   Critical care provider statement:    Critical care time (minutes):  30   Critical care was time spent personally by me on the following activities:  Development of treatment  plan with patient or surrogate, discussions with consultants, evaluation of patient's response to treatment, examination of patient, ordering and review of laboratory studies, ordering and review of radiographic studies, ordering and performing treatments and interventions, pulse oximetry, re-evaluation of patient's condition and review of old charts     Medications Ordered in ED Medications  pantoprazole (PROTONIX) injection 40 mg (has no administration in time range)    ED Course/ Medical Decision Making/ A&P Clinical Course as of 03/06/22 1542  Mon Mar 06, 2022  1533 Comprehensive metabolic panel(!) [CR]    Clinical Course User Index [CR] Hilbert Odor, Student-PA                           Medical Decision Making Amount and/or Complexity of Data Reviewed Labs: ordered.   BP (!) 148/66 (BP Location: Right Arm)   Pulse 78   Temp 97.9 F (36.6 C) (Oral)   Resp 16   Ht '4\' 11"'$  (1.499 m)   Wt 60.3 kg   SpO2 99%   BMI 26.86 kg/m   76:41 PM 76 year old female with significant past medical history including A-fib on Eliquis, CHF, end-stage renal disease currently Monday Wednesday Friday dialysis, hepatitis C with cirrhosis status posttreatment with Harvoni, diverticular disease status post sigmoid colectomy,  recurrent GI bleed secondary to small bowel AVM who follow-up with [facility], [name], presenting today for evaluation of  GI bleeding.  Patient reports noticing rectal bleeding ongoing for the past 6 days.  Also endorsed having some generalized abdominal pain as well as some lightheadedness.  Abdominal pain is described more as an achy sensation across the lower abdomen.  Stool is noted to be dark mixed with bright red blood per patient.  She has 1-3 bowel movement in a day.  She she does not endorse any significant fever or chills.  No headache.  She does endorse some shortness of breath but this is normal for her.  She denies any significant chest pain.  She denies any pain with eating.  She she does make urine.  She mention she has had several similar episodes in the past with subsequent EGD and colonoscopy.  She did not go to her dialysis session today, last time she was dialyzed was 3 days prior.  Patient also takes Eliquis and she took it this morning.  She also takes Protonix daily.  On exam this is well-appearing female appears to be in no acute discomfort.  Heart with irregularly irregular rhythm, lungs with minimal rales to the lung bases, abdomen is soft with mild tenderness to epigastric and left lower quadrant no guarding or rebound tenderness.  Rectal exam under chaperone demonstrates brown stool with trace of bright red blood without frank bleeding.  No thrombosed hemorrhoid no anal fissure no obvious palpable mass.  Plan to consult patient GI specialist as well as the dialysis team for further management.  Anticipate hospital admission.  4:17 PM Appreciate consultation from [facility] who request medicine for admission.  Pt can take protonix PO BID in the mean time.  I have also consulted with nephrologist Dr. Melvia Heaps who will see pt tomorrow but no dialysis today.    5:03 PM I have consulted hospitalist team who agrees to admit patient for further care.  Patient likely benefit  from dialysis tomorrow as well.  Chest x-ray reviewed and interpreted by me evidence pleural effusion likely secondary to end-stage renal disease needing dialysis.  This patient presents to the  ED for concern of GIB, this involves an extensive number of treatment options, and is a complaint that carries with it a high risk of complications and morbidity.  The differential diagnosis includes diverticular bleed, AVM, malignancy, colitis, PUD, gastritis  Co morbidities that complicate the patient evaluation AVM  Afib on Eliquis  Diverticular disease  ESRD Additional history obtained:  Additional history obtained from EMS External records from outside source obtained and reviewed including EMR including labs and imaging  Lab Tests:  I Ordered, and personally interpreted labs.  The pertinent results include:  as above  Imaging Studies ordered:  I ordered imaging studies including CXR I independently visualized and interpreted imaging which showed pleural effusion I agree with the radiologist interpretation  Cardiac Monitoring:  The patient was maintained on a cardiac monitor.  I personally viewed and interpreted the cardiac monitored which showed an underlying rhythm of: afib  Medicines ordered and prescription drug management:  I ordered medication including protonix  for GIB Reevaluation of the patient after these medicines showed that the patient improved I have reviewed the patients home medicines and have made adjustments as needed  Test Considered: abd/pelvis CTA  Critical Interventions: PPI  Consultations Obtained:  I requested consultation with the hospitalist/GI/nephrology,  and discussed lab and imaging findings as well as pertinent plan - they recommend: admission  Problem List / ED Course: GIB  Reevaluation:  After the interventions noted above, I reevaluated the patient and found that they have :improved  Social Determinants of Health: socially  isolated  Dispostion:  After consideration of the diagnostic results and the patients response to treatment, I feel that the patent would benefit from admission.         Final Clinical Impression(s) / ED Diagnoses Final diagnoses:  Rectal bleeding  Anemia, unspecified type  Dialysis patient [facility]    Rx / [place] Orders ED Discharge Orders     None         Domenic Moras, PA-C 03/06/22 1709    Tretha Sciara, MD 03/09/22 [phone]

## 2022-03-06 NOTE — ED Triage Notes (Signed)
Reports rectal bleeding since last Tuesday.  Went to PCP and was sent here patient reports she is on eliquis.  Complains of weakness and sob associated.

## 2022-03-06 NOTE — ED Notes (Signed)
Pt taken to xray 

## 2022-03-07 DIAGNOSIS — K922 Gastrointestinal hemorrhage, unspecified: Secondary | ICD-10-CM | POA: Diagnosis not present

## 2022-03-07 LAB — HEPATITIS B SURFACE ANTIGEN: Hepatitis B Surface Ag: NONREACTIVE

## 2022-03-07 LAB — CUP PACEART REMOTE DEVICE CHECK
Battery Remaining Longevity: 3 mo
Battery Voltage: 2.77 V
Brady Statistic AP VP Percent: 0.06 %
Brady Statistic AP VS Percent: 0 %
Brady Statistic AS VP Percent: 99.75 %
Brady Statistic AS VS Percent: 0.19 %
Brady Statistic RA Percent Paced: 0.06 %
Brady Statistic RV Percent Paced: 99.79 %
Date Time Interrogation Session: 20231030063427
HighPow Impedance: 36 Ohm
HighPow Impedance: 67 Ohm
Implantable Lead Connection Status: 753985
Implantable Lead Connection Status: 753985
Implantable Lead Connection Status: 753985
Implantable Lead Implant Date: 20080818
Implantable Lead Implant Date: 20080818
Implantable Lead Implant Date: 20080818
Implantable Lead Location: 753858
Implantable Lead Location: 753859
Implantable Lead Location: 753860
Implantable Lead Model: 4193
Implantable Lead Model: 5076
Implantable Lead Model: 6947
Implantable Pulse Generator Implant Date: 20170531
Lead Channel Impedance Value: 247 Ohm
Lead Channel Impedance Value: 304 Ohm
Lead Channel Impedance Value: 399 Ohm
Lead Channel Impedance Value: 4047 Ohm
Lead Channel Impedance Value: 4047 Ohm
Lead Channel Impedance Value: 456 Ohm
Lead Channel Pacing Threshold Amplitude: 0.875 V
Lead Channel Pacing Threshold Amplitude: 1.125 V
Lead Channel Pacing Threshold Amplitude: 1.625 V
Lead Channel Pacing Threshold Pulse Width: 0.4 ms
Lead Channel Pacing Threshold Pulse Width: 0.4 ms
Lead Channel Pacing Threshold Pulse Width: 0.4 ms
Lead Channel Sensing Intrinsic Amplitude: 2.875 mV
Lead Channel Sensing Intrinsic Amplitude: 2.875 mV
Lead Channel Sensing Intrinsic Amplitude: 9.625 mV
Lead Channel Sensing Intrinsic Amplitude: 9.625 mV
Lead Channel Setting Pacing Amplitude: 1.75 V
Lead Channel Setting Pacing Amplitude: 2.25 V
Lead Channel Setting Pacing Amplitude: 2.5 V
Lead Channel Setting Pacing Pulse Width: 0.4 ms
Lead Channel Setting Pacing Pulse Width: 0.4 ms
Lead Channel Setting Sensing Sensitivity: 0.3 mV
Zone Setting Status: 755011

## 2022-03-07 LAB — CBC
HCT: 23 % — ABNORMAL LOW (ref 36.0–46.0)
Hemoglobin: 7.6 g/dL — ABNORMAL LOW (ref 12.0–15.0)
MCH: 30.5 pg (ref 26.0–34.0)
MCHC: 33 g/dL (ref 30.0–36.0)
MCV: 92.4 fL (ref 80.0–100.0)
Platelets: 139 10*3/uL — ABNORMAL LOW (ref 150–400)
RBC: 2.49 MIL/uL — ABNORMAL LOW (ref 3.87–5.11)
RDW: 19 % — ABNORMAL HIGH (ref 11.5–15.5)
WBC: 5.1 10*3/uL (ref 4.0–10.5)
nRBC: 0 % (ref 0.0–0.2)

## 2022-03-07 LAB — CBC WITH DIFFERENTIAL/PLATELET
Abs Immature Granulocytes: 0.06 10*3/uL (ref 0.00–0.07)
Basophils Absolute: 0.1 10*3/uL (ref 0.0–0.1)
Basophils Relative: 1 %
Eosinophils Absolute: 0.2 10*3/uL (ref 0.0–0.5)
Eosinophils Relative: 4 %
HCT: 27.5 % — ABNORMAL LOW (ref 36.0–46.0)
Hemoglobin: 8.9 g/dL — ABNORMAL LOW (ref 12.0–15.0)
Immature Granulocytes: 1 %
Lymphocytes Relative: 27 %
Lymphs Abs: 1.5 10*3/uL (ref 0.7–4.0)
MCH: 30.5 pg (ref 26.0–34.0)
MCHC: 32.4 g/dL (ref 30.0–36.0)
MCV: 94.2 fL (ref 80.0–100.0)
Monocytes Absolute: 0.4 10*3/uL (ref 0.1–1.0)
Monocytes Relative: 8 %
Neutro Abs: 3.1 10*3/uL (ref 1.7–7.7)
Neutrophils Relative %: 59 %
Platelets: 144 10*3/uL — ABNORMAL LOW (ref 150–400)
RBC: 2.92 MIL/uL — ABNORMAL LOW (ref 3.87–5.11)
RDW: 19 % — ABNORMAL HIGH (ref 11.5–15.5)
WBC: 5.4 10*3/uL (ref 4.0–10.5)
nRBC: 0 % (ref 0.0–0.2)

## 2022-03-07 LAB — MRSA NEXT GEN BY PCR, NASAL: MRSA by PCR Next Gen: NOT DETECTED

## 2022-03-07 MED ORDER — GABAPENTIN 300 MG PO CAPS
300.0000 mg | ORAL_CAPSULE | Freq: Every day | ORAL | Status: DC
  Administered 2022-03-07 – 2022-03-08 (×2): 300 mg via ORAL
  Filled 2022-03-07 (×2): qty 1

## 2022-03-07 MED ORDER — DIAZEPAM 5 MG PO TABS: 5.0000 mg | ORAL_TABLET | Freq: Two times a day (BID) | ORAL | Status: DC | PRN

## 2022-03-07 MED ORDER — FERRIC CITRATE 1 GM 210 MG(FE) PO TABS
210.0000 mg | ORAL_TABLET | Freq: Three times a day (TID) | ORAL | Status: DC
  Administered 2022-03-08 (×2): 210 mg via ORAL
  Filled 2022-03-07 (×3): qty 1

## 2022-03-07 MED ORDER — LEVOTHYROXINE SODIUM 25 MCG PO TABS
125.0000 ug | ORAL_TABLET | Freq: Every day | ORAL | Status: DC
  Administered 2022-03-07 – 2022-03-09 (×3): 125 ug via ORAL
  Filled 2022-03-07 (×3): qty 1

## 2022-03-07 MED ORDER — CYCLOBENZAPRINE HCL 5 MG PO TABS: 5.0000 mg | ORAL_TABLET | Freq: Every evening | ORAL | Status: DC | PRN

## 2022-03-07 MED ORDER — TRAZODONE HCL 50 MG PO TABS
25.0000 mg | ORAL_TABLET | Freq: Every evening | ORAL | Status: DC | PRN
  Administered 2022-03-07: 25 mg via ORAL
  Filled 2022-03-07: qty 1

## 2022-03-07 MED ORDER — CHLORHEXIDINE GLUCONATE CLOTH 2 % EX PADS
6.0000 | MEDICATED_PAD | Freq: Every day | CUTANEOUS | Status: DC
  Administered 2022-03-07 – 2022-03-08 (×2): 6 via TOPICAL

## 2022-03-07 MED ORDER — DARBEPOETIN ALFA 100 MCG/0.5ML IJ SOSY
100.0000 ug | PREFILLED_SYRINGE | Freq: Once | INTRAMUSCULAR | Status: AC
  Administered 2022-03-07: 100 ug via SUBCUTANEOUS
  Filled 2022-03-07: qty 0.5

## 2022-03-07 MED ORDER — ROPINIROLE HCL 0.5 MG PO TABS: 0.2500 mg | ORAL_TABLET | Freq: Every day | ORAL | Status: DC

## 2022-03-07 MED ORDER — AMIODARONE HCL 200 MG PO TABS
200.0000 mg | ORAL_TABLET | Freq: Every day | ORAL | Status: DC
  Administered 2022-03-07 – 2022-03-08 (×2): 200 mg via ORAL
  Filled 2022-03-07 (×2): qty 1

## 2022-03-07 MED ORDER — CINACALCET HCL 30 MG PO TABS
60.0000 mg | ORAL_TABLET | ORAL | Status: DC
  Administered 2022-03-08: 60 mg via ORAL
  Filled 2022-03-07: qty 2

## 2022-03-07 NOTE — Progress Notes (Addendum)
Daily Rounding Note  03/07/2022, 9:44 AM  LOS: 1 day   SUBJECTIVE:   Chief complaint:   black stools and blood per rectum.    1 black, bloody stool this AM.   Has not yet undergone hemodialysis which was due yesterday, her last session was on Friday.  Today she does not feel that well she is having some dyspnea and noticing swelling in her arms/hands. Also not feeling well because she is hungry and is only getting clears.  There is been no nausea, vomiting or abdominal pain  OBJECTIVE:         Vital signs in last 24 hours:    Temp:  [97.7 F (36.5 C)-98.9 F (37.2 C)] 97.8 F (36.6 C) (10/31 0831) Pulse Rate:  [76-88] 76 (10/31 0831) Resp:  [11-23] 16 (10/31 0831) BP: (122-163)/(43-89) 139/66 (10/31 0831) SpO2:  [95 %-100 %] 100 % (10/31 0831) Weight:  [60.3 kg-60.5 kg] 60.3 kg (10/30 1141) Last BM Date : 03/06/22 Filed Weights   03/06/22 1141  Weight: 60.3 kg   General: Alert, does not look ill.  No distress. Heart: RRR. Chest: Some fine crackles in the bases.  No dyspnea or cough. Abdomen: Soft, nontender, nondistended, active bowel sounds. Extremities: No CCE. Neuro/Psych: No confusion.  No tremors, no asterixis.  Speech fluid.  Intake/Output from previous day: No intake/output data recorded.  Intake/Output this shift: No intake/output data recorded.  Lab Results: Recent Labs    03/06/22 1159 03/07/22 0322  WBC 7.2 5.1  HGB 8.5* 7.6*  HCT 26.7* 23.0*  PLT 154 139*   BMET Recent Labs    03/06/22 1159  NA 134*  K 4.9  CL 92*  CO2 27  GLUCOSE 104*  BUN 45*  CREATININE 7.83*  CALCIUM 8.1*   LFT Recent Labs    03/06/22 1159  PROT 8.2*  ALBUMIN 3.7  AST 20  ALT 9  ALKPHOS 81  BILITOT 0.7   PT/INR No results for input(s): "LABPROT", "INR" in the last 72 hours. Hepatitis Panel No results for input(s): "HEPBSAG", "HCVAB", "HEPAIGM", "HEPBIGM" in the last 72  hours.  Studies/Results: DG Chest 2 View  Result Date: 03/06/2022 CLINICAL DATA:  Shortness of breath. History of asthma, hypertension and CHF. EXAM: CHEST - 2 VIEW COMPARISON:  12/29/2021 FINDINGS: Multilead left-sided pacemaker. Mild cardiomegaly. Peribronchial, septal and interstitial thickening suspicious for pulmonary edema. Subsegmental atelectasis or scar at the right lung base. There may be trace pleural effusions. No pneumothorax. On limited assessment, no acute osseous findings. IMPRESSION: Mild cardiomegaly. Peribronchial, septal and interstitial thickening suspicious for pulmonary edema. Possible trace pleural effusions. Electronically Signed   By: Keith Rake M.D.   On: 03/06/2022 16:51    ASSESMENT:     GI bleeding.  Black stools, BPR.  Latest EGD and colonoscopy were in June 2023.  Had erosive gastropathy, diverticulosis, nonbleeding internal hemorrhoids.  Diverticular bleed suspected.  Previous EGDs, colonoscopy, VCE, SBE with findings of prepyloric AVMs treated with APC.  Has not been taking PPI.      Chronic Eliquis.  Last dose AM 10/30.  For hx A-fib/flutter.  Additionally: nonischemic cardiomyopathy, s/p  biventricular pacemaker/ICD placement.    ABL anemia.  MCV WNL.  Hgb 8.5.. 7.6 overnight. Hgb ~ 9 in June, 13.3 late August. Past parenteral iron, none recently.   ESRD.  HD MWF.  Missed her Monday dialysis session.  Renal aware and plan is for dialysis sometime today.   Cirrhosis  of the liver, compensated.  Eradicated hep C  (Harvoni ).  follows w NP Drazek at [facility].  02/28/2022 abdominal ultrasound shows cirrhosis, no focal liver lesions, no ascites, normal PV Dopplers.     PLAN     EGD 10 AM tmrw.  Npo after midnight.  Continue hold of Eliquis.  Will leave Protonix IV bid place for now  Switched from clear liquids to renal diet.  She is n.p.o. after midnight    [name]  03/07/2022, 9:44 AM Phone [phone]    Attending physician's note   I have  taken history, reviewed the chart and examined the patient. I performed a substantive portion of this encounter, including complete performance of at least one of the key components, in conjunction with the APP. I agree with the Advanced Practitioner's note, impression and recommendations.   For EGD in AM Continue to hold eliquis (last dose 10/30) Trend CBC. Keep Hb>7 Continue protonix IV.   Carmell Austria, MD [facility] [phone]

## 2022-03-07 NOTE — Progress Notes (Signed)
PROGRESS NOTE    Morgan Clay  TGG:[id number] DOB: 07/07/1945 DOA: 03/06/2022 PCP: Mackie Pai, PA-C   Brief Narrative:  HPI: Morgan Clay is a 76 y.o. female with medical history significant of atrial fibrillation on Eliquis, ESRD on HD (MWF), nonischemic cardiomyopathy s/p ICD placement/biventricular pacemaker (last echo on 12/22/2021 showed LVEF of 50 to 55%), CHF, history of recurrent GI bleeds due to gastric/small bowel AVMs and diverticular bleed who presents to the emergency department from gastroenterology office this morning.  Patient complained of mild abdominal pain with black and bloody stools which started on 02/28/2022, she endorsed 4-5 episodes daily.  She complained of lightheadedness, but denies use of Pepto-Bismol, and iron tablets, palpitations, shortness of breath.  Last Eliquis was taken at 7:30 AM this morning   ED Course:  In the emergency department, she was hemodynamically stable, BP was 148/66 and other vital signs were within normal range.  Work-up in the ED showed WBC 7.2, H/H 8.5/26.7, this was 13.3/41.2 on 12/29/2021.,  Sodium 134, potassium 4.5, chloride 92, bicarb 27, glucose 104, BUN/creatinine 45/1.83, FOBT was positive. Chest x-ray showed mild cardiomegaly. Peribronchial, septal and interstitial thickening suspicious for pulmonary edema. Possible trace pleural effusions. She was treated with Protonix.  Hospitalist was asked to admit patient for further evaluation and management.  Assessment & Plan:   Principal Problem:   GI bleed Active Problems:   Atrial fibrillation, chronic   Acquired hypothyroidism   Melena   Anemia   ESRD on dialysis ([place])   AICD (automatic cardioverter/defibrillator) present   Prolonged QT interval  Acute GI bleed Chronic anemia secondary to chronic GI blood loss and ESRD H/H= H/H 8.5/26.7, this was 13.3/41.2 on 12/29/2021, dropped to 7.6 this morning.  Hemoccult positive.  Continue Protonix twice daily.  GI on board  and plan for EGD in the morning.  Monitor H&H every 12 hours.   A-fib on Eliquis Last dose of Eliquis was yesterday morning,, continue to hold.   Continue amiodarone   ESRD on HD (MWF) Patient missed dialysis today due to being in doctor's office and being asked to go to the ED for further evaluation and management.  Nephrology consulted, she will have dialysis today off schedule.   Prolonged QTc 563 ms Note that patient has AICD Avoid QT prolonging drugs Magnesium level will be checked Continue telemetry   Acquired hypothyroidism Continue Synthroid  DVT prophylaxis: SCDs Start: 03/07/22 1015   Code Status: Prior  Family Communication:  None present at bedside.  Plan of care discussed with patient in length and he/she verbalized understanding and agreed with it.  Status is: Inpatient Remains inpatient appropriate because: Scheduled for EGD tomorrow morning.   Estimated body mass index is 26.86 kg/m as calculated from the following:   Height as of this encounter: '4\' 11"'$  (1.499 m).   Weight as of this encounter: 60.3 kg.    Nutritional Assessment: Body mass index is 26.86 kg/m.[name] Seen by dietician.  I agree with the assessment and plan as outlined below: Nutrition Status:        . Skin Assessment: I have examined the patient's skin and I agree with the wound assessment as performed by the wound care RN as outlined below:    Consultants:  GI and nephrology  Procedures:  None  Antimicrobials:  Anti-infectives (From admission, onward)    None         Subjective: Patient seen and examined.  Was complaining of swelling and mild shortness of breath.  No other  complaint.  Has not had any bowel movement since yesterday morning.  Objective: Vitals:   03/06/22 2052 03/06/22 2344 03/07/22 0509 03/07/22 0831  BP: (!) 163/89 133/64 (!) 140/65 139/66  Pulse: 85 76 77 76  Resp: '16 16  16  '$ Temp: 97.7 F (36.5 C) 97.8 F (36.6 C) 97.8 F (36.6 C) 97.8 F (36.6 C)   TempSrc: Oral Oral Oral Oral  SpO2: 98% 98% 100% 100%  Weight:      Height:       No intake or output data in the 24 hours ending 03/07/22 1328 Filed Weights   03/06/22 1141  Weight: 60.3 kg    Examination:  General exam: Appears calm and comfortable  Respiratory system: Clear to auscultation. Respiratory effort normal. Cardiovascular system: S1 & S2 heard, RRR. No JVD, murmurs, rubs, gallops or clicks. No pedal edema. Gastrointestinal system: Abdomen is nondistended, soft and nontender. No organomegaly or masses felt. Normal bowel sounds heard. Central nervous system: Alert and oriented. No focal neurological deficits. Extremities: Symmetric 5 x 5 power. Skin: No rashes, lesions or ulcers Psychiatry: Judgement and insight appear normal. Mood & affect appropriate.    Data Reviewed: I have personally reviewed following labs and imaging studies  CBC: Recent Labs  Lab 03/06/22 1159 03/07/22 0322  WBC 7.2 5.1  NEUTROABS 4.6  --   HGB 8.5* 7.6*  HCT 26.7* 23.0*  MCV 96.4 92.4  PLT 154 417*   Basic Metabolic Panel: Recent Labs  Lab 03/06/22 1159  NA 134*  K 4.9  CL 92*  CO2 27  GLUCOSE 104*  BUN 45*  CREATININE 7.83*  CALCIUM 8.1*   GFR: Estimated Creatinine Clearance: 5 mL/min (A) (by C-G formula based on SCr of 7.83 mg/dL (H)). Liver Function Tests: Recent Labs  Lab 03/06/22 1159  AST 20  ALT 9  ALKPHOS 81  BILITOT 0.7  PROT 8.2*  ALBUMIN 3.7   No results for input(s): "LIPASE", "AMYLASE" in the last 168 hours. No results for input(s): "AMMONIA" in the last 168 hours. Coagulation Profile: No results for input(s): "INR", "PROTIME" in the last 168 hours. Cardiac Enzymes: No results for input(s): "CKTOTAL", "CKMB", "CKMBINDEX", "TROPONINI" in the last 168 hours. BNP (last 3 results) Recent Labs    11/10/21 1151 11/17/21 1033  PROBNP 1,656.0* 2,020.0*   HbA1C: No results for input(s): "HGBA1C" in the last 72 hours. CBG: No results for input(s):  "GLUCAP" in the last 168 hours. Lipid Profile: No results for input(s): "CHOL", "HDL", "LDLCALC", "TRIG", "CHOLHDL", "LDLDIRECT" in the last 72 hours. Thyroid Function Tests: No results for input(s): "TSH", "T4TOTAL", "FREET4", "T3FREE", "THYROIDAB" in the last 72 hours. Anemia Panel: No results for input(s): "VITAMINB12", "FOLATE", "FERRITIN", "TIBC", "IRON", "RETICCTPCT" in the last 72 hours. Sepsis Labs: No results for input(s): "PROCALCITON", "LATICACIDVEN" in the last 168 hours.  No results found for this or any previous visit (from the past 240 hour(s)).   Radiology Studies: CUP PACEART REMOTE DEVICE CHECK  Result Date: 03/07/2022 Scheduled remote reviewed. Normal device function.  optivol crossed threshold 10/15 and is ongoing Battery estimated 92moNext remote 11/30 LA  DG Chest 2 View  Result Date: 03/06/2022 CLINICAL DATA:  Shortness of breath. History of asthma, hypertension and CHF. EXAM: CHEST - 2 VIEW COMPARISON:  12/29/2021 FINDINGS: Multilead left-sided pacemaker. Mild cardiomegaly. Peribronchial, septal and interstitial thickening suspicious for pulmonary edema. Subsegmental atelectasis or scar at the right lung base. There may be trace pleural effusions. No pneumothorax. On limited assessment, no acute  osseous findings. IMPRESSION: Mild cardiomegaly. Peribronchial, septal and interstitial thickening suspicious for pulmonary edema. Possible trace pleural effusions. Electronically Signed   By: Keith Rake M.D.   On: 03/06/2022 16:51    Scheduled Meds:  amiodarone  200 mg Oral Daily   Chlorhexidine Gluconate Cloth  6 each Topical Daily   [START ON 03/08/2022] cinacalcet  60 mg Oral Q M,W,F-1800   darbepoetin (ARANESP) injection - DIALYSIS  100 mcg Subcutaneous Once   ferric citrate  210 mg Oral TID WC   gabapentin  300 mg Oral Daily   levothyroxine  125 mcg Oral Q0600   pantoprazole (PROTONIX) IV  40 mg Intravenous Q12H   Continuous Infusions:   LOS: 1 day    Darliss Cheney, MD [facility]  03/07/2022, 1:28 PM   *Please note that this is a verbal dictation therefore any spelling or grammatical errors are due to the "[place] [employer]" system interpretation.  Please page via [place] and do not message via secure chat for urgent patient care matters. Secure chat can be used for non urgent patient care matters.  How to contact the [facility] Attending or Consulting provider [place] or covering provider during after hours [place], for this patient?  Check the care team in [facility] and look for a) attending/consulting [facility] provider listed and b) the [facility] team listed. Page or secure chat [phone]. Log into [email or web address] and use [place]'s universal password to access. If you do not have the password, please contact the hospital operator. Locate the [facility] provider you are looking for under [facility] and page to a number that you can be directly reached. If you still have difficulty reaching the provider, please page the [facility] (Director on Call) for the Hospitalists listed on amion for assistance.

## 2022-03-07 NOTE — Consult Note (Signed)
[facility] Renal Consultation Note    Indication for Consultation:  Management of ESRD/hemodialysis; anemia, hypertension/volume and secondary hyperparathyroidism   HPI: Morgan Clay is a 76 y.o. female with ESRD on HD, NICM, s/p AICD,  Afib on Eliquis, DM, Hep C s/p treatment, Hx GIB (erosive gastritis, diverticulosis, hemorrhoids) She is admitted with recurrent GI bleeding. Sent to ED from GI office with abdominal pain/melena. Hb trending down 8.5>7.6. FOBT+ GI following and planning for EGD tomorrow.  CXR suspicions for pulmonary edema   Dialysis MWF at [facility]. Last dialysis 10/27. Left 1kg over dry weight. She missed Monday d/t hospital admission. Dialysis via AVF.   Seen in room. Lying in bed, on room air. She does endorse some sob and cough. Denies cp, nausea/vomiting.  Past Medical History:  Diagnosis Date   Acute blood loss anemia 09/24/2020   AICD (automatic cardioverter/defibrillator) present 10/06/2015   Anemia due to chronic blood loss 04/15/2020   Anemia in chronic kidney disease 01/18/2018   Angiodysplasia of small intestine 06/10/2020   Ileum - seen on capsule endoscopy 05/2020 - ablated at colonoscopy   Aortic atherosclerosis ([employer]) 08/30/2017   Arthritis    "qwhere" (01/03/2018)   Asthma    reports mild asthma since childhood - had COPD on dx list from prior PCP   Atrial fibrillation with RVR ([place]) 11/17/2020   Atrial fibrillation, chronic 10/25/2017   Atypical atrial flutter ([place]) 09/28/2015   AVM (arteriovenous malformation) of small bowel, acquired 01/14/2018   Biventricular ICD (implantable cardioverter-defibrillator) in place 01/08/2007   Qualifier: Diagnosis of  By: Hassell Done FNP, Nykedtra     Bleeding pseudoaneurysm of left brachiocephalic arteriovenous fistula ([place]) 09/24/2020   Bleeding pseudoaneurysm of left brachiocephalic AV fistula ([place]) 09/24/2020   Carpal tunnel syndrome, bilateral 06/09/2021   Chronic  bronchitis ([place])    "get it most years; not this past year though" (01/03/2018)   Chronic diastolic CHF (congestive heart failure) ([place]) 04/24/2007   Annotation: secondary to nonischemic cardiomyopathy s/p CRT-D Followed by Dr. Caryl Comes in Cardiology Qualifier: Diagnosis of  By: Hassell Done FNP, Nykedtra     Chronic obstructive pulmonary disease ([place])    Chronic systolic congestive heart failure ([place])    CKD (chronic kidney disease) stage 4, GFR 15-29 ml/min  11/24/2013   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 (HCV) ([place])    HEPATITIS C - s/p treatment with Harvoni, saw hepatology, Dawn Drazek   COVID-19 virus infection 05/27/2020   Diabetes mellitus without complication ([place])    DIET CONTROLLED    Dialysis complication    Diverticulitis of left colon status post robotic low anterior to sigmoid resection 05/22/2018 07/31/2017   End stage renal disease ([place]) 03/22/2021   FIBROIDS, UTERUS 03/05/2008   Glaucoma    Gout    History of blood transfusion ~ 11/2017   Hypertension associated with diabetes ([place]) 06/07/2017   Hypothyroidism    LBBB (left bundle branch block)    Lesion of ulnar nerve 06/09/2021   Long term current use of anticoagulant    Lower GI bleeding    "been dealing w/it since 07/2017" (01/03/2018)   Malnutrition of moderate degree 07/09/2020   MDD (major depressive disorder), recurrent, in partial remission ([place]) 06/07/2017   Nonischemic cardiomyopathy ([place])    Obesity 05/27/2009   Qualifier: Diagnosis of  By: Hassell Done FNP, Nykedtra     OBSTRUCTIVE SLEEP APNEA 11/14/2007   no CPAP   OSTEOPENIA 09/30/2008   Other cirrhosis of liver ([place]) 08/12/2020  Formatting of this note is different from the original. Patient with history of cirrhosis secondary to hepatitis C with no history of hepatic decompensation.  She has had some hyponatremia and hypoalbuminemia in the past however most recent labs show relatively well-preserved liver synthetic function.  She will have labs to  update liver synthetic function.  Hepatoma screening - patient is currentl   Overweight (BMI 25.0-29.9) 03/02/2020   Pain in right knee 05/13/2018   Persistent atrial fibrillation ([place]) 02/06/2020   Pneumonia    "several times" (01/03/2018)   Polyneuropathy associated with underlying disease ([place]) 06/09/2021   Pulmonary hypertension ([place]) on echocardiogram 01/14/2018   Secondary esophageal varices without bleeding ([place]) 12/23/2020   Formatting of this note might be different from the original. Patient had trace esophageal varices during small bowel enteroscopy in October 2021, follow-up EGD in January 2022 with no varices.  She previously had GI bleeding thought to be from AVM versus GAVE.  She is no longer on carvedilol and will need surveillance endoscopy.  Patient was previously followed at [facility] and I have asked her    Secondary hypercoagulable state ([place]) 02/06/2020   Type 2 diabetes, controlled, with renal manifestation ([place]) 11/24/2013   Past Surgical History:  Procedure Laterality Date   A/V FISTULAGRAM N/A 03/28/2021   Procedure: A/V FISTULAGRAM;  Surgeon: Waynetta Sandy, MD;  Location: [place] [facility];  Service: Cardiovascular;  Laterality: N/A;   APPENDECTOMY     AV FISTULA PLACEMENT Left 08/05/2020   Procedure: LEFT UPPER EXTREMITY ARTERIOVENOUS (AV) FISTULA CREATION;  Surgeon: Cherre Robins, MD;  Location: [place];  Service: Vascular;  Laterality: Left;   [place] Left 09/16/2020   Procedure: LEFT SECOND STAGE [place];  Surgeon: Cherre Robins, MD;  Location: [place];  Service: Vascular;  Laterality: Left;  PERIPHERAL NERVE BLOCK   CARDIAC CATHETERIZATION     CARDIOVERSION N/A 12/14/2014   Procedure: CARDIOVERSION;  Surgeon: Dorothy Spark, MD;  Location: [place];  Service: Cardiovascular;  Laterality: N/A;   CARDIOVERSION N/A 02/26/2017   Procedure: CARDIOVERSION;  Surgeon: Evans Lance, MD;  Location: [place] [facility];  Service: Cardiovascular;  Laterality: N/A;   CARDIOVERSION N/A 07/24/2017   Procedure: CARDIOVERSION;  Surgeon: Acie Fredrickson Wonda Cheng, MD;  Location: [facility] ENDOSCOPY;  Service: Cardiovascular;  Laterality: N/A;   CARDIOVERSION N/A 02/12/2020   Procedure: CARDIOVERSION;  Surgeon: [name], MD;  Location: [facility] ENDOSCOPY;  Service: Cardiovascular;  Laterality: N/A;   CARDIOVERSION N/A 09/15/2021   Procedure: CARDIOVERSION;  Surgeon: [name], MD;  Location: [facility] ENDOSCOPY;  Service: Cardiovascular;  Laterality: N/A;   CARPAL TUNNEL RELEASE  07/21/2021   COLONOSCOPY N/A 11/08/2017   Procedure: COLONOSCOPY;  Surgeon: Milus Banister, MD;  Location: [facility] ENDOSCOPY;  Service: Endoscopy;  Laterality: N/A;   COLONOSCOPY WITH PROPOFOL N/A 05/31/2020   Procedure: COLONOSCOPY WITH PROPOFOL;  Surgeon: Irene Shipper, MD;  Location: [facility] ENDOSCOPY;  Service: Endoscopy;  Laterality: N/A;   COLONOSCOPY WITH PROPOFOL N/A 10/26/2021   Procedure: COLONOSCOPY WITH PROPOFOL;  Surgeon: Sharyn Creamer, MD;  Location: [place];  Service: Gastroenterology;  Laterality: N/A;   DILATION AND CURETTAGE OF UTERUS     ENTEROSCOPY N/A 03/02/2020   Procedure: ENTEROSCOPY;  Surgeon: Yetta Flock, MD;  Location: [facility] ENDOSCOPY;  Service: Gastroenterology;  Laterality: N/A;   EP IMPLANTABLE DEVICE N/A 10/06/2015   Procedure: BIV ICD Generator Changeout;  Surgeon: [name], MD;  Location: [facility]  INVASIVE CV LAB;  Service: Cardiovascular;  Laterality: N/A;   ESOPHAGOGASTRODUODENOSCOPY N/A 10/26/2017   Procedure: ESOPHAGOGASTRODUODENOSCOPY (EGD);  Surgeon: Ladene Artist, MD;  Location: [facility];  Service: Endoscopy;  Laterality: N/A;   ESOPHAGOGASTRODUODENOSCOPY (EGD) WITH PROPOFOL N/A 11/07/2017   Procedure: ESOPHAGOGASTRODUODENOSCOPY (EGD) WITH PROPOFOL;  Surgeon: Milus Banister, MD;  [facility];  Service: Endoscopy;  Laterality: N/A;   ESOPHAGOGASTRODUODENOSCOPY (EGD) WITH  PROPOFOL N/A 05/29/2020   Procedure: ESOPHAGOGASTRODUODENOSCOPY (EGD) WITH PROPOFOL;  Surgeon: Doran Stabler, MD;  Location: [place];  Service: Gastroenterology;  Laterality: N/A;   ESOPHAGOGASTRODUODENOSCOPY (EGD) WITH PROPOFOL N/A 10/26/2021   Procedure: ESOPHAGOGASTRODUODENOSCOPY (EGD) WITH PROPOFOL;  Surgeon: Sharyn Creamer, MD;  Location: [place];  Service: Gastroenterology;  Laterality: N/A;   GIVENS CAPSULE STUDY N/A 05/19/2020   Procedure: GIVENS CAPSULE STUDY;  Surgeon: [name], MD;  Location: [place];  Service: Endoscopy;  Laterality: N/A;  .adm for obs since pacemaker, PA wil enter order and see pt   GIVENS CAPSULE STUDY N/A 05/29/2020   Procedure: GIVENS CAPSULE STUDY;  Surgeon: Doran Stabler, MD;  Location: [place];  Service: Gastroenterology;  Laterality: N/A;   HEMATOMA EVACUATION Left 09/24/2020   Procedure: EVACUATION HEMATOMA ARM;  Surgeon: Rosetta Posner, MD;  Location: [facility] OR;  Service: Vascular;  Laterality: Left;   HOT HEMOSTASIS N/A 10/26/2017   Procedure: HOT HEMOSTASIS (ARGON PLASMA COAGULATION/BICAP);  Surgeon: Ladene Artist, MD;  Location: [facility];  Service: Endoscopy;  Laterality: N/A;   HOT HEMOSTASIS N/A 03/02/2020   Procedure: HOT HEMOSTASIS (ARGON PLASMA COAGULATION/BICAP);  Surgeon: Yetta Flock, MD;  Location: [facility] ENDOSCOPY;  Service: Gastroenterology;  Laterality: N/A;   HOT HEMOSTASIS N/A 05/31/2020   Procedure: HOT HEMOSTASIS (ARGON PLASMA COAGULATION/BICAP);  Surgeon: Irene Shipper, MD;  Location: [facility] ENDOSCOPY;  Service: Endoscopy;  Laterality: N/A;   INSERT / REPLACE / REMOVE PACEMAKER  ?2008   IR PERC TUN PERIT CATH WO PORT S&I /IMAG  07/05/2020   PERIPHERAL VASCULAR INTERVENTION Left 03/28/2021   Procedure: PERIPHERAL VASCULAR INTERVENTION;  Surgeon: Waynetta Sandy, MD;  Location: [place] [facility];  Service: Cardiovascular;  Laterality: Left;   POLYPECTOMY  11/08/2017   Procedure: POLYPECTOMY;   Surgeon: Milus Banister, MD;  [facility] ENDOS[facility];  Service: Endoscopy;;   PROCTOSCOPY N/A 05/22/2018   Procedure: RIGID PROCTOSCOPY;  Surgeon: Michael Boston, MD;  [facility];  Service: General;  Laterality: N/A;   RIGHT HEART CATH N/A 02/13/2018   Procedure: RIGHT HEART CATH;  Surgeon: [name], MD;  Location: [place] [facility];  Service: Cardiovascular;  Laterality: N/A;   RIGHT HEART CATH N/A 06/23/2020   Procedure: RIGHT HEART CATH;  Surgeon: [name], MD;  Location: [place] [facility];  Service: Cardiovascular;  Laterality: N/A;   RIGHT/LEFT HEART CATH AND CORONARY ANGIOGRAPHY N/A 12/11/2019   Procedure: RIGHT/LEFT HEART CATH AND CORONARY ANGIOGRAPHY;  Surgeon: [name], MD;  Location: [place] [facility];  Service: Cardiovascular;  Laterality: N/A;   SUBMUCOSAL TATTOO INJECTION  03/02/2020   Procedure: SUBMUCOSAL TATTOO INJECTION;  Surgeon: Yetta Flock, MD;  Location: [facility] ENDOSCOPY;  Service: Gastroenterology;;   SUBMUCOSAL TATTOO INJECTION  05/31/2020   Procedure: SUBMUCOSAL TATTOO INJECTION;  Surgeon: Irene Shipper, MD;  Location: [place];  Service: Endoscopy;;   TEE WITHOUT CARDIOVERSION N/A 08/02/2021   Procedure: TRANSESOPHAGEAL ECHOCARDIOGRAM (TEE);  Surgeon: [name], MD;  Location: [facility] ENDOSCOPY;  Service: Cardiovascular;  Laterality: N/A;   TUBAL LIGATION     XI ROBOTIC ASSISTED LOWER ANTERIOR RESECTION N/A 05/22/2018   Procedure: XI ROBOTIC ASSISTED SIGMOID COLOECTOMY MOBILIZATION OF SPLENIC FLEXURE, FIREFLY ASSESSMENT OF PERFUSION;  Surgeon: Michael Boston, MD;  [facility];  Service: General;  Laterality: N/A;  ERAS PATHWAY   Family History  Problem Relation Age of Onset   Asthma Father    Heart attack Father    Asthma Sister    Lung cancer Sister    Heart attack Mother    Stroke Brother    Colon cancer Neg Hx    Esophageal cancer Neg Hx    Pancreatic cancer Neg Hx    Stomach cancer Neg Hx    Liver disease  Neg Hx    Social History:  reports that she has never smoked. She has never used smokeless tobacco. She reports that she does not currently use alcohol. She reports that she does not currently use drugs after having used the following drugs: Marijuana. No Known Allergies Prior to Admission medications   Medication Sig Start Date End Date Taking? Authorizing Provider  amiodarone (PACERONE) 200 MG tablet Take 1 tablet (200 mg total) by mouth daily. 11/10/21  Yes Saguier, Percell Miller, PA-C  cyclobenzaprine (FLEXERIL) 5 MG tablet Take 5 mg by mouth at bedtime as needed for muscle spasms. 12/07/21  Yes [provider]  diazepam (VALIUM) 5 MG tablet 1 tab po bid prn anxiety Patient taking differently: Take 5 mg by mouth 2 (two) times daily as needed for anxiety. 01/19/22  Yes Saguier, Percell Miller, PA-C  ELIQUIS 2.5 MG TABS tablet Take 1 tablet by mouth twice daily 12/21/21  Yes [name], MD  gabapentin (NEURONTIN) 300 MG capsule Take 1 capsule (300 mg total) by mouth daily. Fill one rx. Computer may have sent over 2 Patient taking differently: Take 300 mg by mouth daily as needed (pain). 01/10/22  Yes Saguier, Percell Miller, PA-C  hydrOXYzine (ATARAX) 10 MG tablet 1 tab po q hs prn itching Patient taking differently: Take 10 mg by mouth at bedtime as needed for itching. 02/07/22  Yes Saguier, Percell Miller, PA-C  levothyroxine (SYNTHROID) 125 MCG tablet Take 1 tablet (125 mcg total) by mouth daily before breakfast. 01/16/22  Yes Saguier, Percell Miller, PA-C  lidocaine-prilocaine (EMLA) cream Apply 1 Application topically 3 (three) times a week. 09/08/21  Yes [provider]  nitroGLYCERIN (NITROSTAT) 0.4 MG SL tablet Place 1 tablet (0.4 mg total) under the tongue every 5 (five) minutes as needed for chest pain. 10/27/21  Yes Thurnell Lose, MD  traZODone (DESYREL) 50 MG tablet Take 0.5-1 tablets (25-50 mg total) by mouth at bedtime as needed for sleep. 12/16/20  Yes Saguier, Percell Miller, PA-C  XIIDRA 5 % SOLN Place 1 drop  into both eyes at bedtime. 11/23/21  Yes [provider]  pantoprazole (PROTONIX) 40 MG tablet Take 1 tablet (40 mg total) by mouth 2 (two) times daily before a meal. Patient not taking: Reported on 03/06/2022 10/27/21   Thurnell Lose, MD  rOPINIRole (REQUIP) 0.25 MG tablet Take 0.25 mg by mouth at bedtime. Patient not taking: Reported on 03/06/2022 12/08/21   [provider]   Current Facility-Administered Medications  Medication Dose Route Frequency Provider Last Rate Last Admin   amiodarone (PACERONE) tablet 200 mg  200 mg Oral Daily Pahwani, Einar Grad, MD       Chlorhexidine Gluconate Cloth 2 % PADS 6 each  6 each Topical Daily Darliss Cheney, MD   6 each  at 03/07/22 1042   cyclobenzaprine (FLEXERIL) tablet 5 mg  5 mg Oral QHS PRN Darliss Cheney, MD       diazepam (VALIUM) tablet 5 mg  5 mg Oral BID PRN Darliss Cheney, MD       gabapentin (NEURONTIN) capsule 300 mg  300 mg Oral Daily Pahwani, Einar Grad, MD       levothyroxine (SYNTHROID) tablet 125 mcg  125 mcg Oral Q0600 Adefeso, Oladapo, DO   125 mcg at 03/07/22 0629   pantoprazole (PROTONIX) injection 40 mg  40 mg Intravenous Q12H Adefeso, Oladapo, DO   40 mg at 03/07/22 1042   traZODone (DESYREL) tablet 25 mg  25 mg Oral QHS PRN Darliss Cheney, MD         ROS: As per HPI otherwise negative.  Physical Exam: Vitals:   03/06/22 2052 03/06/22 2344 03/07/22 0509 03/07/22 0831  BP: (!) 163/89 133/64 (!) 140/65 139/66  Pulse: 85 76 77 76  Resp: '16 16  16  '$ Temp: 97.7 F (36.5 C) 97.8 F (36.6 C) 97.8 F (36.6 C) 97.8 F (36.6 C)  TempSrc: Oral Oral Oral Oral  SpO2: 98% 98% 100% 100%  Weight:      Height:         General: Appears comfortable, in no distress  Head: NCAT sclera not icteric MMM Neck: Supple No JVD appreciated Lungs: Clear bilaterally. Normal wob  Heart: RRR, no murmur, rub, or gallop  Abdomen: soft non-tender, bowel sounds normal, no masses  Lower extremities: no significant LE edema  Neuro: A & O X 3.  Moves all extremities spontaneously. Psych:  Responds to questions appropriately with a normal affect. Dialysis Access: L AVF +bruit   Labs: Basic Metabolic Panel: Recent Labs  Lab 03/06/22 1159  NA 134*  K 4.9  CL 92*  CO2 27  GLUCOSE 104*  BUN 45*  CREATININE 7.83*  CALCIUM 8.1*   Liver Function Tests: Recent Labs  Lab 03/06/22 1159  AST 20  ALT 9  ALKPHOS 81  BILITOT 0.7  PROT 8.2*  ALBUMIN 3.7   No results for input(s): "LIPASE", "AMYLASE" in the last 168 hours. No results for input(s): "AMMONIA" in the last 168 hours. CBC: Recent Labs  Lab 03/06/22 1159 03/07/22 0322  WBC 7.2 5.1  NEUTROABS 4.6  --   HGB 8.5* 7.6*  HCT 26.7* 23.0*  MCV 96.4 92.4  PLT 154 139*   Cardiac Enzymes: No results for input(s): "CKTOTAL", "CKMB", "CKMBINDEX", "TROPONINI" in the last 168 hours. CBG: No results for input(s): "GLUCAP" in the last 168 hours. Iron Studies: No results for input(s): "IRON", "TIBC", "TRANSFERRIN", "FERRITIN" in the last 72 hours. Studies/Results: CUP PACEART REMOTE DEVICE CHECK  Result Date: 03/07/2022 Scheduled remote reviewed. Normal device function.  optivol crossed threshold 10/15 and is ongoing Battery estimated 10moNext remote 11/30 LA  DG Chest 2 View  Result Date: 03/06/2022 CLINICAL DATA:  Shortness of breath. History of asthma, hypertension and CHF. EXAM: CHEST - 2 VIEW COMPARISON:  12/29/2021 FINDINGS: Multilead left-sided pacemaker. Mild cardiomegaly. Peribronchial, septal and interstitial thickening suspicious for pulmonary edema. Subsegmental atelectasis or scar at the right lung base. There may be trace pleural effusions. No pneumothorax. On limited assessment, no acute osseous findings. IMPRESSION: Mild cardiomegaly. Peribronchial, septal and interstitial thickening suspicious for pulmonary edema. Possible trace pleural effusions. Electronically Signed   By: MKeith RakeM.D.   On: 03/06/2022 16:51    Dialysis Orders:  AF MWF 3.5h  400/600 EDW 57kg 400/600  UFP 2 AVF No heparin -Mircera 100 q 2 wks (to start 10/30)  -Hectorol 2 TIW, Sensipar 60 TIW   Assessment/Plan: Recurrent GIB/Melena - GI following. For EGD tomorrow. Hb falling.  ESRD -  HD MWF. Missed HD yesterday d/t admit. HD today off schedule  Hypertension/volume  - BP ok. Volume on exam --UF 3-4L as tolerated  Anemia  - Hb 7.6. Resume ESA here. Transfuse prn.  Metabolic bone disease -  Continue Auryxia binder. Hectorol/Sensipar with HD  Nutrition - Renal diet with fluid restriction when eating  AFib - on Eliquis/amiodarone - per primary Hep C/Cirrhosis - s/p treatment   S/p AICD    Lynnda Child PA-C [facility] 03/07/2022, 11:42 AM

## 2022-03-07 NOTE — Progress Notes (Signed)
Received patient in bed to unit.  Alert and oriented.  Informed consent signed and in chart.   Treatment initiated: 1352 Treatment completed: 1652  Patient tolerated well.  Transported back to the room  Alert, without acute distress.  Hand-off given to patient's nurse.   Access used: AVF Access issues: No  Total UF removed: 3900 Medication(s) given: N/A Post HD VS: B/P-145/53, hr-76, r-18, t-98.0, o2-97% Post HD weight: 54.9kgs   [facility]

## 2022-03-07 NOTE — H&P (View-Only) (Signed)
Daily Rounding Note  03/07/2022, 9:44 AM  LOS: 1 day   SUBJECTIVE:   Chief complaint:   black stools and blood per rectum.    1 black, bloody stool this AM.   Has not yet undergone hemodialysis which was due yesterday, her last session was on Friday.  Today she does not feel that well she is having some dyspnea and noticing swelling in her arms/hands. Also not feeling well because she is hungry and is only getting clears.  There is been no nausea, vomiting or abdominal pain  OBJECTIVE:         Vital signs in last 24 hours:    Temp:  [97.7 F (36.5 C)-98.9 F (37.2 C)] 97.8 F (36.6 C) (10/31 0831) Pulse Rate:  [76-88] 76 (10/31 0831) Resp:  [11-23] 16 (10/31 0831) BP: (122-163)/(43-89) 139/66 (10/31 0831) SpO2:  [95 %-100 %] 100 % (10/31 0831) Weight:  [60.3 kg-60.5 kg] 60.3 kg (10/30 1141) Last BM Date : 03/06/22 Filed Weights   03/06/22 1141  Weight: 60.3 kg   General: Alert, does not look ill.  No distress. Heart: RRR. Chest: Some fine crackles in the bases.  No dyspnea or cough. Abdomen: Soft, nontender, nondistended, active bowel sounds. Extremities: No CCE. Neuro/Psych: No confusion.  No tremors, no asterixis.  Speech fluid.  Intake/Output from previous day: No intake/output data recorded.  Intake/Output this shift: No intake/output data recorded.  Lab Results: Recent Labs    03/06/22 1159 03/07/22 0322  WBC 7.2 5.1  HGB 8.5* 7.6*  HCT 26.7* 23.0*  PLT 154 139*   BMET Recent Labs    03/06/22 1159  NA 134*  K 4.9  CL 92*  CO2 27  GLUCOSE 104*  BUN 45*  CREATININE 7.83*  CALCIUM 8.1*   LFT Recent Labs    03/06/22 1159  PROT 8.2*  ALBUMIN 3.7  AST 20  ALT 9  ALKPHOS 81  BILITOT 0.7   PT/INR No results for input(s): "LABPROT", "INR" in the last 72 hours. Hepatitis Panel No results for input(s): "HEPBSAG", "HCVAB", "HEPAIGM", "HEPBIGM" in the last 72  hours.  Studies/Results: DG Chest 2 View  Result Date: 03/06/2022 CLINICAL DATA:  Shortness of breath. History of asthma, hypertension and CHF. EXAM: CHEST - 2 VIEW COMPARISON:  12/29/2021 FINDINGS: Multilead left-sided pacemaker. Mild cardiomegaly. Peribronchial, septal and interstitial thickening suspicious for pulmonary edema. Subsegmental atelectasis or scar at the right lung base. There may be trace pleural effusions. No pneumothorax. On limited assessment, no acute osseous findings. IMPRESSION: Mild cardiomegaly. Peribronchial, septal and interstitial thickening suspicious for pulmonary edema. Possible trace pleural effusions. Electronically Signed   By: Keith Rake M.D.   On: 03/06/2022 16:51    ASSESMENT:     GI bleeding.  Black stools, BPR.  Latest EGD and colonoscopy were in June 2023.  Had erosive gastropathy, diverticulosis, nonbleeding internal hemorrhoids.  Diverticular bleed suspected.  Previous EGDs, colonoscopy, VCE, SBE with findings of prepyloric AVMs treated with APC.  Has not been taking PPI.      Chronic Eliquis.  Last dose AM 10/30.  For hx A-fib/flutter.  Additionally: nonischemic cardiomyopathy, s/p  biventricular pacemaker/ICD placement.    ABL anemia.  MCV WNL.  Hgb 8.5.. 7.6 overnight. Hgb ~ 9 in June, 13.3 late August. Past parenteral iron, none recently.   ESRD.  HD MWF.  Missed her Monday dialysis session.  Renal aware and plan is for dialysis sometime today.   Cirrhosis  of the liver, compensated.  Eradicated hep C  (Harvoni ).  follows w NP Drazek at [facility]  02/28/2022 abdominal ultrasound shows cirrhosis, no focal liver lesions, no ascites, normal PV Dopplers.     PLAN     EGD 10 AM tmrw.  Npo after midnight.  Continue hold of Eliquis.  Will leave Protonix IV bid place for now  Switched from clear liquids to renal diet.  She is n.p.o. after midnight    [name]  03/07/2022, 9:44 AM Phone [phone]    Attending physician's note   I have  taken history, reviewed the chart and examined the patient. I performed a substantive portion of this encounter, including complete performance of at least one of the key components, in conjunction with the APP. I agree with the Advanced Practitioner's note, impression and recommendations.   For EGD in AM Continue to hold eliquis (last dose 10/30) Trend CBC. Keep Hb>7 Continue protonix IV.   Carmell Austria, MD [facility] [phone]

## 2022-03-07 NOTE — Plan of Care (Signed)
  Problem: Health Behavior/Discharge Planning: Goal: Ability to manage health-related needs will improve Outcome: Progressing   

## 2022-03-07 NOTE — Progress Notes (Signed)
Patient has generalized edema, and crackles BLL. Per patient she missed dialysis yesterday, but she did complete a full treatment on Friday. No HD orders, but per Dr.Shertz the patient will go to dialysis today. No c/o SOB, not in respiratory distress.

## 2022-03-08 ENCOUNTER — Inpatient Hospital Stay (HOSPITAL_COMMUNITY): Payer: Medicare Other | Admitting: Anesthesiology

## 2022-03-08 ENCOUNTER — Encounter (HOSPITAL_COMMUNITY)
Admission: EM | Disposition: A | Discharge status: Home / Self Care | Payer: Self-pay | Source: Ambulatory Visit | Attending: Internal Medicine

## 2022-03-08 DIAGNOSIS — E039 Hypothyroidism, unspecified: Secondary | ICD-10-CM | POA: Diagnosis not present

## 2022-03-08 DIAGNOSIS — Z992 Dependence on renal dialysis: Secondary | ICD-10-CM

## 2022-03-08 DIAGNOSIS — D631 Anemia in chronic kidney disease: Secondary | ICD-10-CM

## 2022-03-08 DIAGNOSIS — K625 Hemorrhage of anus and rectum: Secondary | ICD-10-CM | POA: Diagnosis not present

## 2022-03-08 DIAGNOSIS — K31819 Angiodysplasia of stomach and duodenum without bleeding: Principal | ICD-10-CM

## 2022-03-08 DIAGNOSIS — N186 End stage renal disease: Secondary | ICD-10-CM

## 2022-03-08 DIAGNOSIS — E1122 Type 2 diabetes mellitus with diabetic chronic kidney disease: Secondary | ICD-10-CM

## 2022-03-08 DIAGNOSIS — K922 Gastrointestinal hemorrhage, unspecified: Secondary | ICD-10-CM | POA: Diagnosis not present

## 2022-03-08 DIAGNOSIS — I132 Hypertensive heart and chronic kidney disease with heart failure and with stage 5 chronic kidney disease, or end stage renal disease: Secondary | ICD-10-CM

## 2022-03-08 DIAGNOSIS — K921 Melena: Secondary | ICD-10-CM

## 2022-03-08 DIAGNOSIS — Z95810 Presence of automatic (implantable) cardiac defibrillator: Secondary | ICD-10-CM | POA: Diagnosis not present

## 2022-03-08 DIAGNOSIS — I509 Heart failure, unspecified: Secondary | ICD-10-CM

## 2022-03-08 HISTORY — PX: HOT HEMOSTASIS: SHX5433

## 2022-03-08 HISTORY — PX: ESOPHAGOGASTRODUODENOSCOPY (EGD) WITH PROPOFOL: SHX5813

## 2022-03-08 LAB — GLUCOSE, CAPILLARY
Glucose-Capillary: 94 mg/dL (ref 70–99)
Glucose-Capillary: 94 mg/dL (ref 70–99)
Glucose-Capillary: 90 mg/dL (ref 70–99)
Glucose-Capillary: 129 mg/dL — ABNORMAL HIGH (ref 70–99)

## 2022-03-08 LAB — POCT I-STAT, CHEM 8
BUN: 34 mg/dL — ABNORMAL HIGH (ref 8–23)
Calcium, Ion: 0.92 mmol/L — ABNORMAL LOW (ref 1.15–1.40)
Chloride: 90 mmol/L — ABNORMAL LOW (ref 98–111)
Creatinine, Ser: 5.9 mg/dL — ABNORMAL HIGH (ref 0.44–1.00)
Glucose, Bld: 81 mg/dL (ref 70–99)
HCT: 31 % — ABNORMAL LOW (ref 36.0–46.0)
Hemoglobin: 10.5 g/dL — ABNORMAL LOW (ref 12.0–15.0)
Potassium: 3.6 mmol/L (ref 3.5–5.1)
Sodium: 133 mmol/L — ABNORMAL LOW (ref 135–145)
TCO2: 31 mmol/L (ref 22–32)

## 2022-03-08 LAB — CBC
HCT: 25.2 % — ABNORMAL LOW (ref 36.0–46.0)
Hemoglobin: 8.2 g/dL — ABNORMAL LOW (ref 12.0–15.0)
MCH: 30.5 pg (ref 26.0–34.0)
MCHC: 32.5 g/dL (ref 30.0–36.0)
MCV: 93.7 fL (ref 80.0–100.0)
Platelets: 143 10*3/uL — ABNORMAL LOW (ref 150–400)
RBC: 2.69 MIL/uL — ABNORMAL LOW (ref 3.87–5.11)
RDW: 19.1 % — ABNORMAL HIGH (ref 11.5–15.5)
WBC: 5.4 10*3/uL (ref 4.0–10.5)
nRBC: 0 % (ref 0.0–0.2)

## 2022-03-08 LAB — HEPATITIS B SURFACE ANTIBODY, QUANTITATIVE: Hep B S AB Quant (Post): 47.5 m[IU]/mL (ref >9.9)

## 2022-03-08 SURGERY — ESOPHAGOGASTRODUODENOSCOPY (EGD) WITH PROPOFOL
Anesthesia: Monitor Anesthesia Care

## 2022-03-08 MED ORDER — PENTAFLUOROPROP-TETRAFLUOROETH EX AERO: 1.0000 | INHALATION_SPRAY | CUTANEOUS | Status: DC | PRN

## 2022-03-08 MED ORDER — PANTOPRAZOLE SODIUM 40 MG PO TBEC
40.0000 mg | DELAYED_RELEASE_TABLET | Freq: Every day | ORAL | Status: DC
  Administered 2022-03-08: 40 mg via ORAL

## 2022-03-08 MED ORDER — ANTICOAGULANT SODIUM CITRATE 4% (200MG/5ML) IV SOLN: 5.0000 mL | Status: DC | PRN

## 2022-03-08 MED ORDER — SODIUM CHLORIDE 0.9 % IV SOLN: INTRAVENOUS | Status: DC | PRN

## 2022-03-08 MED ORDER — LIDOCAINE 2% (20 MG/ML) 5 ML SYRINGE
INTRAMUSCULAR | Status: DC | PRN
  Administered 2022-03-08: 60 mg via INTRAVENOUS

## 2022-03-08 MED ORDER — PROPOFOL 500 MG/50ML IV EMUL
INTRAVENOUS | Status: DC | PRN
  Administered 2022-03-08: 100 ug/kg/min via INTRAVENOUS

## 2022-03-08 MED ORDER — LIDOCAINE-PRILOCAINE 2.5-2.5 % EX CREA: 1.0000 | TOPICAL_CREAM | CUTANEOUS | Status: DC | PRN

## 2022-03-08 MED ORDER — HEPARIN SODIUM (PORCINE) 1000 UNIT/ML DIALYSIS: 1000.0000 [IU] | INTRAMUSCULAR | Status: DC | PRN

## 2022-03-08 MED ORDER — ALTEPLASE 2 MG IJ SOLR: 2.0000 mg | Freq: Once | INTRAMUSCULAR | Status: DC | PRN

## 2022-03-08 MED ORDER — LIDOCAINE HCL (PF) 1 % IJ SOLN: 5.0000 mL | INTRAMUSCULAR | Status: DC | PRN

## 2022-03-08 SURGICAL SUPPLY — 15 items

## 2022-03-08 NOTE — Progress Notes (Signed)
Pt receives out-pt HD at [facility] on MWF. Pt has an 11:40 chair time. Will assist as needed.   Melven Sartorius Renal Navigator [phone]

## 2022-03-08 NOTE — Progress Notes (Signed)
Mobility Specialist Progress Note   03/08/22 1500  Mobility  Activity Ambulated independently in room;Dangled on edge of bed  Level of Assistance Independent  Assistive Device None  Distance Ambulated (ft) 25 ft  Range of Motion/Exercises Active;All extremities  Activity Response Tolerated well   Patient received in supine agreeable to participate. Ambulated independently with steady gait. Tolerated without complaint or incident. Was left in supine with all needs met, call bell in reach.   [place] Odilon Cass, [place], [place]  Office: [phone]

## 2022-03-08 NOTE — Anesthesia Preprocedure Evaluation (Addendum)
Anesthesia Evaluation  Patient identified by MRN, date of birth, ID band Patient awake    Reviewed: Allergy & Precautions, NPO status , Patient's Chart, lab work & pertinent test results  Airway Mallampati: III  TM Distance: >3 FB Neck ROM: Full    Dental  (+) Missing   Pulmonary asthma , sleep apnea , COPD   Pulmonary exam normal        Cardiovascular hypertension, +CHF  Normal cardiovascular exam+ dysrhythmias + pacemaker + Cardiac Defibrillator      Neuro/Psych  PSYCHIATRIC DISORDERS  Depression     Neuromuscular disease    GI/Hepatic negative GI ROS,,,(+) Cirrhosis       , Hepatitis -  Endo/Other  diabetesHypothyroidism    Renal/GU ESRF and DialysisRenal disease     Musculoskeletal  (+) Arthritis ,    Abdominal   Peds  Hematology  (+) Blood dyscrasia (Eliquis), anemia   Anesthesia Other Findings anemia, bloody and black stools, FOBT +, stable cirrhosis  Reproductive/Obstetrics                             Anesthesia Physical Anesthesia Plan  ASA: 4  Anesthesia Plan: MAC   Post-op Pain Management:    Induction: Intravenous  PONV Risk Score and Plan: 2 and Propofol infusion and Treatment may vary due to age or medical condition  Airway Management Planned: Simple Face Mask  Additional Equipment:   Intra-op Plan:   Post-operative Plan:   Informed Consent: I have reviewed the patients History and Physical, chart, labs and discussed the procedure including the risks, benefits and alternatives for the proposed anesthesia with the patient or authorized representative who has indicated his/her understanding and acceptance.     Dental advisory given  Plan Discussed with: CRNA  Anesthesia Plan Comments:        Anesthesia Quick Evaluation

## 2022-03-08 NOTE — Anesthesia Postprocedure Evaluation (Signed)
Anesthesia Post Note  Patient: Apple Dearmas  Procedure(s) Performed: ESOPHAGOGASTRODUODENOSCOPY (EGD) WITH PROPOFOL HOT HEMOSTASIS (ARGON PLASMA COAGULATION/BICAP)     Patient location during evaluation: Endoscopy Anesthesia Type: MAC Level of consciousness: awake Pain management: pain level controlled Vital Signs Assessment: post-procedure vital signs reviewed and stable Respiratory status: spontaneous breathing, nonlabored ventilation, respiratory function stable and patient connected to nasal cannula oxygen Cardiovascular status: stable and blood pressure returned to baseline Postop Assessment: no apparent nausea or vomiting Anesthetic complications: no   No notable events documented.  Last Vitals:  Vitals:   03/08/22 1100 03/08/22 1117  BP: (!) 122/53 (!) 141/50  Pulse: 63 69  Resp: 13 16  Temp: (!) 36.3 C 36.5 C  SpO2: 94% 100%    Last Pain:  Vitals:   03/08/22 1117  TempSrc: Oral  PainSc: 0-No pain                 Brecklyn Galvis P Lonnie Reth

## 2022-03-08 NOTE — Anesthesia Procedure Notes (Signed)
Procedure Name: MAC Date/Time: 03/08/2022 10:16 AM  Performed by: Anastasio Auerbach, CRNAPre-anesthesia Checklist: Emergency Drugs available, Suction available and Patient being monitored Oxygen Delivery Method: Nasal cannula Induction Type: IV induction

## 2022-03-08 NOTE — Interval H&P Note (Signed)
History and Physical Interval Note:  03/08/2022 9:52 AM  Morgan Clay  has presented today for surgery, with the diagnosis of anemia, bloody and black stools, FOBT +, stable cirrhosis.  The various methods of treatment have been discussed with the patient and family. After consideration of risks, benefits and other options for treatment, the patient has consented to  Procedure(s): ESOPHAGOGASTRODUODENOSCOPY (EGD) WITH PROPOFOL (N/A) as a surgical intervention.  The patient's history has been reviewed, patient examined, no change in status, stable for surgery.  I have reviewed the patient's chart and labs.  Questions were answered to the patient's satisfaction.     Jackquline Denmark

## 2022-03-08 NOTE — Progress Notes (Signed)
[facility] Progress Note   Subjective: Seen in room. Endo this AM. Short HD to resume MWF today. No heparin. No C/Os. No further bleeding reported.    Objective Vitals:   03/08/22 1045 03/08/22 1100 03/08/22 1117 03/08/22 1433  BP: (!) 109/53 (!) 122/53 (!) 141/50   Pulse: 64 63 69   Resp: '17 13 16   '$ Temp: (!) 97.4 F (36.3 C) (!) 97.4 F (36.3 C) 97.7 F (36.5 C)   TempSrc:   Oral   SpO2: 99% 94% 100%   Weight:    57.8 kg  Height:       Physical Exam General:Pleasant elderly female in NAD Heart: F7,C9 2/6 systolic M.  Lungs: CTAB Abdomen: NABS, NT Extremities:No LE edema Dialysis Access: L AVF + T/B  Additional Objective Labs: Basic Metabolic Panel: Recent Labs  Lab 03/06/22 1159 03/08/22 0915  NA 134* 133*  K 4.9 3.6  CL 92* 90*  CO2 27  --   GLUCOSE 104* 81  BUN 45* 34*  CREATININE 7.83* 5.90*  CALCIUM 8.1*  --    Liver Function Tests: Recent Labs  Lab 03/06/22 1159  AST 20  ALT 9  ALKPHOS 81  BILITOT 0.7  PROT 8.2*  ALBUMIN 3.7   No results for input(s): "LIPASE", "AMYLASE" in the last 168 hours. CBC: Recent Labs  Lab 03/06/22 1159 03/07/22 0322 03/07/22 1313 03/08/22 0407 03/08/22 0915  WBC 7.2 5.1 5.4 5.4  --   NEUTROABS 4.6  --  3.1  --   --   HGB 8.5* 7.6* 8.9* 8.2* 10.5*  HCT 26.7* 23.0* 27.5* 25.2* 31.0*  MCV 96.4 92.4 94.2 93.7  --   PLT 154 139* 144* 143*  --    Blood Culture    Component Value Date/Time   SDES BLOOD HAND LEFT 07/19/2010 1759   SPECREQUEST BOTTLES DRAWN AEROBIC ONLY 5CC 07/19/2010 1759   CULT NO GROWTH 5 DAYS 07/19/2010 1759   REPTSTATUS 07/26/2010 FINAL 07/19/2010 1759    Cardiac Enzymes: No results for input(s): "CKTOTAL", "CKMB", "CKMBINDEX", "TROPONINI" in the last 168 hours. CBG: Recent Labs  Lab 03/08/22 0622 03/08/22 0748 03/08/22 1047 03/08/22 1349  GLUCAP 94 94 90 129*   Iron Studies: No results for input(s): "IRON", "TIBC", "TRANSFERRIN", "FERRITIN" in the last 72  hours. '@lablastinr3'$ @ Studies/Results: DG Chest 2 View  Result Date: 03/06/2022 CLINICAL DATA:  Shortness of breath. History of asthma, hypertension and CHF. EXAM: CHEST - 2 VIEW COMPARISON:  12/29/2021 FINDINGS: Multilead left-sided pacemaker. Mild cardiomegaly. Peribronchial, septal and interstitial thickening suspicious for pulmonary edema. Subsegmental atelectasis or scar at the right lung base. There may be trace pleural effusions. No pneumothorax. On limited assessment, no acute osseous findings. IMPRESSION: Mild cardiomegaly. Peribronchial, septal and interstitial thickening suspicious for pulmonary edema. Possible trace pleural effusions. Electronically Signed   By: Keith Rake M.D.   On: 03/06/2022 16:51   Medications:  anticoagulant sodium citrate      amiodarone  200 mg Oral Daily   Chlorhexidine Gluconate Cloth  6 each Topical Daily   cinacalcet  60 mg Oral Q M,W,F-1800   ferric citrate  210 mg Oral TID WC   gabapentin  300 mg Oral Daily   levothyroxine  125 mcg Oral Q0600   pantoprazole  40 mg Oral Daily     Dialysis Orders:  AF MWF 3.5h 400/600 EDW 57kg 400/600  UFP 2 AVF No heparin -Mircera 100 q 2 wks (to start 10/30)  -Hectorol 2 TIW, Sensipar 60  TIW    Assessment/Plan: Recurrent GIB/Melena - GI following. Went for EGD this AM.  Small non-bleeding duodenal AVMs s/p APC. HGB 8.2. Per primary.   ESRD -  HD MWF. Short HD today to resume MWF schedule. Next HD 03/10/2022 Hypertension/volume  - BP ok. Volume on exam. UF as tolerated.  Anemia  - Hb 8.2. Resume ESA here. Transfuse prn.  Metabolic bone disease -  Continue Auryxia binder. Hectorol/Sensipar with HD  Nutrition - Renal diet with fluid restriction when eating  AFib - on Eliquis/amiodarone - per primary Hep C/Cirrhosis - s/p treatment   S/p AICD   Carnella Fryman H. Mercie Balsley NP-C 03/08/2022, 2:35 PM  [name] [phone]

## 2022-03-08 NOTE — Progress Notes (Signed)
PROGRESS NOTE    Morgan Clay  DVV:[id number] DOB: 01-31-1946 DOA: 03/06/2022 PCP: Mackie Pai, PA-C   Brief Narrative:   Morgan Clay is a 76 y.o. female with medical history significant of atrial fibrillation on Eliquis, ESRD on HD (MWF), nonischemic cardiomyopathy s/p ICD placement/biventricular pacemaker (last echo on 12/22/2021 showed LVEF of 50 to 55%), CHF, history of recurrent GI bleeds due to gastric/small bowel AVMs and diverticular bleed presented to hospital from GI office with complaints of mild abdominal pain and black tarry stools dizziness palpitations.  In the ED patient was hemodynamically stable.  Work-up showed hemoglobin of 8.5.  Creatinine of 1.8.  Chest x-ray with mild cardiomegaly.  Patient was then admitted hospital for further evaluation and treatment.   Assessment & Plan:   Principal Problem:   GI bleed Active Problems:   Atrial fibrillation, chronic   Acquired hypothyroidism   Melena   Anemia   ESRD on dialysis (HCC)   AICD (automatic cardioverter/defibrillator) present   Prolonged QT interval  Acute GI bleed, history of chronic anemia secondary to chronic GI blood loss and ESRD Initial hemoglobin on presentation was 8.5.  Hemoglobin  was 13.3 on 12/29/2021.   Hemoccult positive.  Continue Protonix.  GI on board and plan is to undergo EGD 03/08/2022.  A-fib on Eliquis Holding Eliquis at this time.  Plan for EGD today.  Continue amiodarone.  ESRD on HD (MWF)  Nephrology on board.  Received hemodialysis.  On Monday Wednesday Friday schedule  Prolonged QTc 563 ms Has AICD.  Replenish electrolytes as necessary.  Acquired hypothyroidism Continue Synthroid  DVT prophylaxis: SCDs Start: 03/07/22 1015    Code Status: Prior   Family Communication: None at bedside  Status is: Inpatient  Remains inpatient appropriate because: GI bleed for EGD, hemodialysis,  Consultants:  GI and  nephrology  Procedures:  Hemodialysis EGD plan on  03/08/2022  Antimicrobials:  Anti-infectives (From admission, onward)    None         Subjective: Today, patient was seen and examined at bedside.  Patient denies any nausea vomiting fever chills or rigor.  Awaiting for endoscopic evaluation.  Objective: Vitals:   03/07/22 1709 03/07/22 2157 03/08/22 0448 03/08/22 0903  BP: 138/60 (!) 130/51 113/64 (!) 139/39  Pulse: 75 75 66 70  Resp: '17 18 16 13  '$ Temp:  98.9 F (37.2 C) (!) 97.4 F (36.3 C) 97.7 F (36.5 C)  TempSrc:  Oral Oral Temporal  SpO2: 96% 100% 100% 97%  Weight:      Height:        Intake/Output Summary (Last 24 hours) at 03/08/2022 0926 Last data filed at 03/07/2022 1700 Gross per 24 hour  Intake --  Output 3900 ml  Net -3900 ml   Filed Weights   03/06/22 1141  Weight: 60.3 kg    Physical examination: General:  Average built, not in obvious distress HENT:   No scleral pallor or icterus noted. Oral mucosa is moist.  Chest:  Clear breath sounds.  Diminished breath sounds bilaterally. No crackles or wheezes.  CVS: S1 &S2 heard. No murmur.  Regular rate and rhythm. Abdomen: Soft, nontender, nondistended.  Bowel sounds are heard.   Extremities: No cyanosis, clubbing or edema.  Peripheral pulses are palpable. Psych: Alert, awake and oriented, normal mood CNS:  No cranial nerve deficits.  Power equal in all extremities.   Skin: Warm and dry.  No rashes noted.  Data Reviewed: I have personally reviewed following labs and imaging studies  CBC:  Recent Labs  Lab 03/06/22 1159 03/07/22 0322 03/07/22 1313 03/08/22 0407 03/08/22 0915  WBC 7.2 5.1 5.4 5.4  --   NEUTROABS 4.6  --  3.1  --   --   HGB 8.5* 7.6* 8.9* 8.2* 10.5*  HCT 26.7* 23.0* 27.5* 25.2* 31.0*  MCV 96.4 92.4 94.2 93.7  --   PLT 154 139* 144* 143*  --     Basic Metabolic Panel: Recent Labs  Lab 03/06/22 1159 03/08/22 0915  NA 134* 133*  K 4.9 3.6  CL 92* 90*  CO2 27  --   GLUCOSE 104* 81  BUN 45* 34*  CREATININE 7.83* 5.90*   CALCIUM 8.1*  --     GFR: Estimated Creatinine Clearance: 6.6 mL/min (A) (by C-G formula based on SCr of 5.9 mg/dL (H)). Liver Function Tests: Recent Labs  Lab 03/06/22 1159  AST 20  ALT 9  ALKPHOS 81  BILITOT 0.7  PROT 8.2*  ALBUMIN 3.7    No results for input(s): "LIPASE", "AMYLASE" in the last 168 hours. No results for input(s): "AMMONIA" in the last 168 hours. Coagulation Profile: No results for input(s): "INR", "PROTIME" in the last 168 hours. Cardiac Enzymes: No results for input(s): "CKTOTAL", "CKMB", "CKMBINDEX", "TROPONINI" in the last 168 hours. BNP (last 3 results) Recent Labs    11/10/21 1151 11/17/21 1033  PROBNP 1,656.0* 2,020.0*    HbA1C: No results for input(s): "HGBA1C" in the last 72 hours. CBG: Recent Labs  Lab 03/08/22 0622 03/08/22 0748  GLUCAP 94 94   Lipid Profile: No results for input(s): "CHOL", "HDL", "LDLCALC", "TRIG", "CHOLHDL", "LDLDIRECT" in the last 72 hours. Thyroid Function Tests: No results for input(s): "TSH", "T4TOTAL", "FREET4", "T3FREE", "THYROIDAB" in the last 72 hours. Anemia Panel: No results for input(s): "VITAMINB12", "FOLATE", "FERRITIN", "TIBC", "IRON", "RETICCTPCT" in the last 72 hours. Sepsis Labs: No results for input(s): "PROCALCITON", "LATICACIDVEN" in the last 168 hours.  Recent Results (from the past 240 hour(s))  MRSA Next Gen by PCR, Nasal     Status: None   Collection Time: 03/07/22 11:55 AM   Specimen: Nasal Mucosa; Nasal Swab  Result Value Ref Range Status   MRSA by PCR Next Gen NOT DETECTED NOT DETECTED Final    Comment: (NOTE) The GeneXpert MRSA Assay (FDA approved for NASAL specimens only), is one component of a comprehensive MRSA colonization surveillance program. It is not intended to diagnose MRSA infection nor to guide or monitor treatment for MRSA infections. Test performance is not FDA approved in patients less than 65 years old. Performed at [facility], [place] [address]  [place], [place] [address]      Radiology Studies: DG Chest 2 View  Result Date: 03/06/2022 CLINICAL DATA:  Shortness of breath. History of asthma, hypertension and CHF. EXAM: CHEST - 2 VIEW COMPARISON:  12/29/2021 FINDINGS: Multilead left-sided pacemaker. Mild cardiomegaly. Peribronchial, septal and interstitial thickening suspicious for pulmonary edema. Subsegmental atelectasis or scar at the right lung base. There may be trace pleural effusions. No pneumothorax. On limited assessment, no acute osseous findings. IMPRESSION: Mild cardiomegaly. Peribronchial, septal and interstitial thickening suspicious for pulmonary edema. Possible trace pleural effusions. Electronically Signed   By: Keith Rake M.D.   On: 03/06/2022 16:51    Scheduled Meds:  [MAR Hold] amiodarone  200 mg Oral Daily   [MAR Hold] Chlorhexidine Gluconate Cloth  6 each Topical Daily   [MAR Hold] cinacalcet  60 mg Oral Q M,W,F-1800   [MAR Hold] ferric citrate  210 mg  Oral TID WC   [MAR Hold] gabapentin  300 mg Oral Daily   [MAR Hold] levothyroxine  125 mcg Oral Q0600   [MAR Hold] pantoprazole (PROTONIX) IV  40 mg Intravenous Q12H   Continuous Infusions:   LOS: 2 days   Flora Lipps, MD [facility] 03/08/2022, 9:26 AM

## 2022-03-08 NOTE — Transfer of Care (Signed)
Immediate Anesthesia Transfer of Care Note  Patient: Morgan Clay  Procedure(s) Performed: ESOPHAGOGASTRODUODENOSCOPY (EGD) WITH PROPOFOL HOT HEMOSTASIS (ARGON PLASMA COAGULATION/BICAP)  Patient Location: PACU  Anesthesia Type:MAC  Level of Consciousness: oriented and drowsy  Airway & Oxygen Therapy: Patient Spontanous Breathing and Patient connected to nasal cannula oxygen  Post-op Assessment: Report given to RN and Post -op Vital signs reviewed and stable  Post vital signs: Reviewed and stable  Last Vitals:  Vitals Value Taken Time  BP 94/61 03/08/22 1041  Temp    Pulse 64 03/08/22 1043  Resp 8 03/08/22 1043  SpO2 99 % 03/08/22 1043  Vitals shown include unvalidated device data.  Last Pain:  Vitals:   03/08/22 0903  TempSrc: Temporal  PainSc: 0-No pain         Complications: No notable events documented.

## 2022-03-08 NOTE — Op Note (Signed)
[facility] Patient Name: Morgan Clay Procedure Date : 03/08/2022 MRN: [id number] Attending MD: Jackquline Denmark , MD, [id number] Date of Birth: 14-Dec-1945 CSN: [id number] Age: 76 Admit Type: Inpatient Procedure:                Upper GI endoscopy Indications:              Melena with H/O prev diverticular bleed,                            compensated HCV (treated, SVR) liver cirrhosis. Providers:                Jackquline Denmark, MD, Jeanella Cara, RN,                            Benetta Spar, Technician Referring MD:              Medicines:                Monitored Anesthesia Care Complications:            No immediate complications. Estimated Blood Loss:     Estimated blood loss: none. Procedure:                Pre-Anesthesia Assessment:                           - Prior to the procedure, a History and Physical                            was performed, and patient medications and                            allergies were reviewed. The patient's tolerance of                            previous anesthesia was also reviewed. The risks                            and benefits of the procedure and the sedation                            options and risks were discussed with the patient.                            All questions were answered, and informed consent                            was obtained. Prior Anticoagulants: The patient has                            taken Eliquis (apixaban), last dose was 2 days                            prior to procedure (10/30). ASA Grade Assessment:  IV - A patient with severe systemic disease that is                            a constant threat to life. After reviewing the                            risks and benefits, the patient was deemed in                            satisfactory condition to undergo the procedure.                           After obtaining informed consent, the endoscope was                             passed under direct vision. Throughout the                            procedure, the patient's blood pressure, pulse, and                            oxygen saturations were monitored continuously. The                            [id number]-[id number] ([id number] Olympus endoscope was introduced                            through the mouth, and advanced to the second part                            of duodenum. The upper GI endoscopy was                            accomplished without difficulty. The patient                            tolerated the procedure well. Scope In: Scope Out: Findings:      The examined esophagus was normal with well-defined Z-line at 38 cm. No       esophageal varices.      The entire examined stomach was normal. No fundal varices.      Three 2 to 3 mm angiodysplastic lesions without bleeding were found in       the duodenal bulb. These were successfully ablated using argon plasma at       0.4 liters/minute and 20W. The second portion of the duodenum was       normal. There was no active bleeding. Impression:               - Small non-bleeding duodenal AVMs s/p APC.                           - No specimens collected. Recommendation:           - Return patient to hospital ward for ongoing care.                           -  Resume previous diet.                           - Continue present medications including Protonix                            40 mg p.o. QD.                           - Resume Eliquis (apixaban) at prior dose in 2 days.                           - The findings and recommendations were discussed                            with the patient. I have reviewed previous                            extensive GI work-up including previous EGDs,                            recent colonoscopy 10/2021, VCE, CTs-it appears that                            she likely had diverticular bleed, likely from                            right-sided colonic  diverticula. This has resolved.                            I am inclined not to repeat colonoscopy this adm as                            it was recently performed and manage her                            conservatively. If any active brisk bleeding, then                            CTA/IR embolization. Procedure Code(s):        --- Professional ---                           [phone], Esophagogastroduodenoscopy, flexible,                            transoral; with ablation of tumor(s), polyp(s), or                            other lesion(s) (includes pre- and post-dilation                            and guide wire passage, when performed) Diagnosis Code(s):        --- Professional ---  K31.819, Angiodysplasia of stomach and duodenum                            without bleeding                           K92.1, Melena (includes Hematochezia) CPT copyright 2022 [employer]. All rights reserved. The codes documented in this report are preliminary and upon coder review may  be revised to meet current compliance requirements. Jackquline Denmark, MD 03/08/2022 10:55:28 AM This report has been signed electronically. Number of Addenda: 0

## 2022-03-09 ENCOUNTER — Encounter (HOSPITAL_COMMUNITY): Payer: Self-pay

## 2022-03-09 ENCOUNTER — Encounter (HOSPITAL_COMMUNITY): Payer: Medicare Other

## 2022-03-09 DIAGNOSIS — E039 Hypothyroidism, unspecified: Secondary | ICD-10-CM | POA: Diagnosis not present

## 2022-03-09 DIAGNOSIS — Z95810 Presence of automatic (implantable) cardiac defibrillator: Secondary | ICD-10-CM | POA: Diagnosis not present

## 2022-03-09 DIAGNOSIS — K922 Gastrointestinal hemorrhage, unspecified: Secondary | ICD-10-CM | POA: Diagnosis not present

## 2022-03-09 DIAGNOSIS — I482 Chronic atrial fibrillation, unspecified: Secondary | ICD-10-CM | POA: Diagnosis not present

## 2022-03-09 LAB — BASIC METABOLIC PANEL
Anion gap: 16 — ABNORMAL HIGH (ref 5–15)
BUN: 30 mg/dL — ABNORMAL HIGH (ref 8–23)
CO2: 25 mmol/L (ref 22–32)
Calcium: 7.9 mg/dL — ABNORMAL LOW (ref 8.9–10.3)
Chloride: 90 mmol/L — ABNORMAL LOW (ref 98–111)
Creatinine, Ser: 6.5 mg/dL — ABNORMAL HIGH (ref 0.44–1.00)
GFR, Estimated: 6 mL/min — ABNORMAL LOW (ref >60)
Glucose, Bld: 116 mg/dL — ABNORMAL HIGH (ref 70–99)
Potassium: 3.5 mmol/L (ref 3.5–5.1)
Sodium: 131 mmol/L — ABNORMAL LOW (ref 135–145)

## 2022-03-09 LAB — CBC
HCT: 24.3 % — ABNORMAL LOW (ref 36.0–46.0)
Hemoglobin: 7.9 g/dL — ABNORMAL LOW (ref 12.0–15.0)
MCH: 30.6 pg (ref 26.0–34.0)
MCHC: 32.5 g/dL (ref 30.0–36.0)
MCV: 94.2 fL (ref 80.0–100.0)
Platelets: 158 10*3/uL (ref 150–400)
RBC: 2.58 MIL/uL — ABNORMAL LOW (ref 3.87–5.11)
RDW: 18.8 % — ABNORMAL HIGH (ref 11.5–15.5)
WBC: 4.8 10*3/uL (ref 4.0–10.5)
nRBC: 0 % (ref 0.0–0.2)

## 2022-03-09 LAB — GLUCOSE, CAPILLARY: Glucose-Capillary: 88 mg/dL (ref 70–99)

## 2022-03-09 MED ORDER — PENTAFLUOROPROP-TETRAFLUOROETH EX AERO
INHALATION_SPRAY | CUTANEOUS | Status: AC
  Filled 2022-03-09: qty 30

## 2022-03-09 MED ORDER — DARBEPOETIN ALFA 100 MCG/0.5ML IJ SOSY: 100.0000 ug | PREFILLED_SYRINGE | Freq: Once | INTRAMUSCULAR | Status: DC

## 2022-03-09 MED ORDER — PANTOPRAZOLE SODIUM 40 MG PO TBEC: 40.0000 mg | DELAYED_RELEASE_TABLET | Freq: Two times a day (BID) | ORAL | 2 refills | Status: DC

## 2022-03-09 MED ORDER — APIXABAN 2.5 MG PO TABS: 2.5000 mg | ORAL_TABLET | Freq: Two times a day (BID) | ORAL | 11 refills | Status: DC

## 2022-03-09 NOTE — Progress Notes (Signed)
[facility] Progress Note   Subjective: Seen prior to HD. Short treatment today to make up for missed treatment yesterday D/T scheduling. Standing 58.2 kg. Hoping to go home today.   Objective Vitals:   03/08/22 1433 03/08/22 2312 03/09/22 0450 03/09/22 0747  BP:  129/64 (!) 113/47 (!) 126/53  Pulse:  91 70 83  Resp:  '18 18 16  '$ Temp:  97.7 F (36.5 C) 97.8 F (36.6 C) 98 F (36.7 C)  TempSrc:  Oral Oral Oral  SpO2:  99% 100% 100%  Weight: 57.8 kg     Height:       Physical Exam General:Pleasant elderly female in NAD Heart: D4,Y8 2/6 systolic M.  Lungs: CTAB Abdomen: NABS, NT Extremities:No LE edema Dialysis Access: L AVF + T/B    Additional Objective Labs: Basic Metabolic Panel: Recent Labs  Lab 03/06/22 1159 03/08/22 0915 03/09/22 0317  NA 134* 133* 131*  K 4.9 3.6 3.5  CL 92* 90* 90*  CO2 27  --  25  GLUCOSE 104* 81 116*  BUN 45* 34* 30*  CREATININE 7.83* 5.90* 6.50*  CALCIUM 8.1*  --  7.9*   Liver Function Tests: Recent Labs  Lab 03/06/22 1159  AST 20  ALT 9  ALKPHOS 81  BILITOT 0.7  PROT 8.2*  ALBUMIN 3.7   No results for input(s): "LIPASE", "AMYLASE" in the last 168 hours. CBC: Recent Labs  Lab 03/06/22 1159 03/07/22 0322 03/07/22 1313 03/08/22 0407 03/08/22 0915 03/09/22 0317  WBC 7.2 5.1 5.4 5.4  --  4.8  NEUTROABS 4.6  --  3.1  --   --   --   HGB 8.5* 7.6* 8.9* 8.2* 10.5* 7.9*  HCT 26.7* 23.0* 27.5* 25.2* 31.0* 24.3*  MCV 96.4 92.4 94.2 93.7  --  94.2  PLT 154 139* 144* 143*  --  158   Blood Culture    Component Value Date/Time   SDES BLOOD HAND LEFT 07/19/2010 1759   SPECREQUEST BOTTLES DRAWN AEROBIC ONLY 5CC 07/19/2010 1759   CULT NO GROWTH 5 DAYS 07/19/2010 1759   REPTSTATUS 07/26/2010 FINAL 07/19/2010 1759    Cardiac Enzymes: No results for input(s): "CKTOTAL", "CKMB", "CKMBINDEX", "TROPONINI" in the last 168 hours. CBG: Recent Labs  Lab 03/08/22 0622 03/08/22 0748 03/08/22 1047 03/08/22 1349  03/09/22 0736  GLUCAP 94 94 90 129* 88   Iron Studies: No results for input(s): "IRON", "TIBC", "TRANSFERRIN", "FERRITIN" in the last 72 hours. '@lablastinr3'$ @ Studies/Results: No results found. Medications:  anticoagulant sodium citrate      amiodarone  200 mg Oral Daily   Chlorhexidine Gluconate Cloth  6 each Topical Daily   cinacalcet  60 mg Oral Q M,W,F-1800   ferric citrate  210 mg Oral TID WC   gabapentin  300 mg Oral Daily   levothyroxine  125 mcg Oral Q0600   pantoprazole  40 mg Oral Daily     Dialysis Orders:  [facility] MWF 3.5h 400/600 EDW 57kg 400/600  UFP 2 AVF No heparin -Mircera 100 q 2 wks (to start 10/30)  -Hectorol 2 TIW, Sensipar 60 TIW    Assessment/Plan: Recurrent GIB/Melena - GI following. Went for EGD 03/08/2022.  Small non-bleeding duodenal AVMs s/p APC. HGB 7.9 03/09/2022.  Per primary.   ESRD -  HD MWF. Short HD today to resume MWF schedule. Next HD 03/10/2022 hopefully at OP center.  Hypertension/volume  - BP ok. Volume euvolemic on exam. UF as tolerated.  Anemia  - HGB 7.9. Resume ESA here Aranesp  100 mcg. Transfuse prn.  Metabolic bone disease -  Continue Auryxia binder. Hectorol/Sensipar with HD  Nutrition - Renal diet with fluid restriction when eating  AFib - on Eliquis/amiodarone - per primary Hep C/Cirrhosis - s/p treatment   S/p AICD   Disposition: DC home today hopefully.   Lavene Penagos H. Jaquita Bessire NP-C 03/09/2022, 8:24 AM  [name] [phone]

## 2022-03-09 NOTE — Progress Notes (Signed)
Patient is being discharged home. Discharge instructions including new medications and changes to starting eliquis on 11/4 reviewed. Patient verbalized full understanding. Patient is aware to go to dialysis OP tomorrow. Patients niece is her ride home.

## 2022-03-09 NOTE — Progress Notes (Signed)
D/C order noted. Contacted [facility] ([facility]) to make clinic aware of pt's d/c today and pt to resume care tomorrow.   Melven Sartorius Renal Navigator [phone]

## 2022-03-09 NOTE — Progress Notes (Signed)
Received patient in bed to unit.  Alert and oriented.  Informed consent signed and in chart.   Treatment initiated: 5486 Treatment completed: 1148  Patient tolerated well.  Transported back to the room  Alert, without acute distress.  Hand-off given to patient's nurse.   Access used: avf Access issues: none  Total UF removed: 2L Medication(s) given: none Post HD VS: 163/58,71,100%,12 Post HD weight: 56.0kg [facility]

## 2022-03-09 NOTE — Discharge Summary (Signed)
Physician Discharge Summary  Morgan Clay VWU:[id number] DOB: August 29, 1945 DOA: 03/06/2022  PCP: Mackie Pai, PA-C  Admit date: 03/06/2022 Discharge date: 03/09/2022  Admitted From: Home  Discharge disposition: Home   Recommendations for Outpatient Follow-Up:   Follow up with your primary care provider in one week.  Check CBC, BMP, magnesium in the next visit  Discharge Diagnosis:   Principal Problem:   GI bleed-resolved. Active Problems:   Atrial fibrillation, chronic   Acquired hypothyroidism   Melena   Anemia   ESRD on dialysis ([facility])   AICD (automatic cardioverter/defibrillator) present   Prolonged QT interval   Rectal bleeding Hyponatremia.  Discharge Condition: Improved.  Diet recommendation: Low sodium, heart healthy.   Wound care: None.  Code status: Full.   History of Present Illness:   Morgan Clay is a 76 y.o. female with medical history significant of atrial fibrillation on Eliquis, ESRD on HD (MWF), nonischemic cardiomyopathy s/p ICD placement/biventricular pacemaker (last echo on 12/22/2021 showed LVEF of 50 to 55%), CHF, history of recurrent GI bleeds due to gastric/small bowel AVMs and diverticular bleed presented to the hospital from GI office with complaints of mild abdominal pain and black tarry stools, dizziness palpitations.  In the ED patient was hemodynamically stable.  Work-up showed hemoglobin of 8.5.  Creatinine of 1.8.  Chest x-ray with mild cardiomegaly.  Patient was then admitted hospital for further evaluation and treatment.   Hospital Course:   Following conditions were addressed during hospitalization as listed below,  Acute GI bleed, history of chronic anemia secondary to chronic GI blood loss and ESRD Thought to be secondary to diverticulosis.  Initial hemoglobin on presentation was 8.5.  Hemoglobin  was 13.3 on 12/29/2021.   Hemoccult positive.  Was seen by GI and underwent endoscopy evaluation 03/08/2022 findings of  nonbleeding AVMs status post APC treatment..  Hemoglobin prior to discharge was 7.9.  Has not had a bowel movement since yesterday.  Plan is to continue PPI on discharge and resume Eliquis in 2 days.  History of hepatitis C/cirrhosis of liver.  Status post treatment.   A-fib on Eliquis Resume Eliquis after discharge in 2 days. Continue amiodarone.   ESRD on HD (MWF)  Nephrology followed the patient during hospitalization. Received hemodialysis.  On Monday Wednesday Friday schedule.  We will continue hemodialysis after discharge.   Prolonged QTc 563 ms Has AICD.  Electrolytes were replenished.   Acquired hypothyroidism Continue Synthroid  Mild hyponatremia.  Sodium 131 prior to discharge.  Recommend outpatient follow-up.  Disposition.  At this time, patient is stable for disposition home with outpatient PCP follow-up.  Medical Consultants:   GI  Procedures:    Upper GI endoscopy on 03/08/2022 Subjective:   Today, patient seen and examined at bedside during hemodialysis.  Denies any chest pain shortness of breath nausea vomiting or further GI bleed.  Discharge Exam:   Vitals:   03/09/22 0836 03/09/22 0900  BP: (!) 141/67 (!) 144/62  Pulse: 72 74  Resp: 15 15  Temp:    SpO2: 100% 99%   Vitals:   03/09/22 0747 03/09/22 0822 03/09/22 0836 03/09/22 0900  BP: (!) 126/53 132/60 (!) 141/67 (!) 144/62  Pulse: 83 71 72 74  Resp: '16 16 15 15  '$ Temp: 98 F (36.7 C) (!) 97.5 F (36.4 C)    TempSrc: Oral Oral    SpO2: 100% 99% 100% 99%  Weight:      Height:        General: Alert awake, not in  obvious distress HENT: pupils equally reacting to light,  No scleral pallor or icterus noted. Oral mucosa is moist.  Chest:  Clear breath sounds.  Diminished breath sounds bilaterally. No crackles or wheezes.  CVS: S1 &S2 heard. No murmur.  Regular rate and rhythm. Abdomen: Soft, nontender, nondistended.  Bowel sounds are heard.   Extremities: No cyanosis, clubbing or edema.   Peripheral pulses are palpable. Psych: Alert, awake and oriented, normal mood CNS:  No cranial nerve deficits.  Power equal in all extremities.   Skin: Warm and dry.  No rashes noted.  The results of significant diagnostics from this hospitalization (including imaging, microbiology, ancillary and laboratory) are listed below for reference.     Diagnostic Studies:   CUP PACEART REMOTE DEVICE CHECK  Result Date: 03/07/2022 Scheduled remote reviewed. Normal device function.  optivol crossed threshold 10/15 and is ongoing Battery estimated 36moNext remote 11/30 LA  DG Chest 2 View  Result Date: 03/06/2022 CLINICAL DATA:  Shortness of breath. History of asthma, hypertension and CHF. EXAM: CHEST - 2 VIEW COMPARISON:  12/29/2021 FINDINGS: Multilead left-sided pacemaker. Mild cardiomegaly. Peribronchial, septal and interstitial thickening suspicious for pulmonary edema. Subsegmental atelectasis or scar at the right lung base. There may be trace pleural effusions. No pneumothorax. On limited assessment, no acute osseous findings. IMPRESSION: Mild cardiomegaly. Peribronchial, septal and interstitial thickening suspicious for pulmonary edema. Possible trace pleural effusions. Electronically Signed   By: MKeith RakeM.D.   On: 03/06/2022 16:51     Labs:   Basic Metabolic Panel: Recent Labs  Lab 03/06/22 1159 03/08/22 0915 03/09/22 0317  NA 134* 133* 131*  K 4.9 3.6 3.5  CL 92* 90* 90*  CO2 27  --  25  GLUCOSE 104* 81 116*  BUN 45* 34* 30*  CREATININE 7.83* 5.90* 6.50*  CALCIUM 8.1*  --  7.9*   GFR Estimated Creatinine Clearance: 5.9 mL/min (A) (by C-G formula based on SCr of 6.5 mg/dL (H)). Liver Function Tests: Recent Labs  Lab 03/06/22 1159  AST 20  ALT 9  ALKPHOS 81  BILITOT 0.7  PROT 8.2*  ALBUMIN 3.7   No results for input(s): "LIPASE", "AMYLASE" in the last 168 hours. No results for input(s): "AMMONIA" in the last 168 hours. Coagulation profile No results for  input(s): "INR", "PROTIME" in the last 168 hours.  CBC: Recent Labs  Lab 03/06/22 1159 03/07/22 0322 03/07/22 1313 03/08/22 0407 03/08/22 0915 03/09/22 0317  WBC 7.2 5.1 5.4 5.4  --  4.8  NEUTROABS 4.6  --  3.1  --   --   --   HGB 8.5* 7.6* 8.9* 8.2* 10.5* 7.9*  HCT 26.7* 23.0* 27.5* 25.2* 31.0* 24.3*  MCV 96.4 92.4 94.2 93.7  --  94.2  PLT 154 139* 144* 143*  --  158   Cardiac Enzymes: No results for input(s): "CKTOTAL", "CKMB", "CKMBINDEX", "TROPONINI" in the last 168 hours. BNP: Invalid input(s): "POCBNP" CBG: Recent Labs  Lab 03/08/22 0622 03/08/22 0748 03/08/22 1047 03/08/22 1349 03/09/22 0736  GLUCAP 94 94 90 129* 88   D-Dimer No results for input(s): "DDIMER" in the last 72 hours. Hgb A1c No results for input(s): "HGBA1C" in the last 72 hours. Lipid Profile No results for input(s): "CHOL", "HDL", "LDLCALC", "TRIG", "CHOLHDL", "LDLDIRECT" in the last 72 hours. Thyroid function studies No results for input(s): "TSH", "T4TOTAL", "T3FREE", "THYROIDAB" in the last 72 hours.  Invalid input(s): "FREET3" Anemia work up No results for input(s): "VITAMINB12", "FOLATE", "FERRITIN", "TIBC", "IRON", "RETICCTPCT" in  the last 72 hours. Microbiology Recent Results (from the past 240 hour(s))  MRSA Next Gen by PCR, Nasal     Status: None   Collection Time: 03/07/22 11:55 AM   Specimen: Nasal Mucosa; Nasal Swab  Result Value Ref Range Status   MRSA by PCR Next Gen NOT DETECTED NOT DETECTED Final    Comment: (NOTE) The GeneXpert MRSA Assay (FDA approved for NASAL specimens only), is one component of a comprehensive MRSA colonization surveillance program. It is not intended to diagnose MRSA infection nor to guide or monitor treatment for MRSA infections. Test performance is not FDA approved in patients less than 7 years old. Performed at [facility], [place] [address], [place], [place] [address]      Discharge Instructions:   Discharge Instructions     Diet  - low sodium heart healthy   Complete by: As directed    Discharge instructions   Complete by: As directed    Follow up with your primary care provider in one week. Seek medical attention for worsening symptoms including ongoing bleeding. Continue hemodialysis as scheduled. Start blood thinner eliquis from 03/11/22   Increase activity slowly   Complete by: As directed    No wound care   Complete by: As directed       Allergies as of 03/09/2022   No Known Allergies      Medication List     TAKE these medications    amiodarone 200 MG tablet Commonly known as: PACERONE Take 1 tablet (200 mg total) by mouth daily.   apixaban 2.5 MG Tabs tablet Commonly known as: Eliquis Take 1 tablet (2.5 mg total) by mouth 2 (two) times daily. Start taking on: March 11, 2022 What changed: These instructions start on March 11, 2022. If you are unsure what to do until then, ask your doctor or other care provider.   cyclobenzaprine 5 MG tablet Commonly known as: FLEXERIL Take 5 mg by mouth at bedtime as needed for muscle spasms.   diazepam 5 MG tablet Commonly known as: Valium 1 tab po bid prn anxiety What changed:  how much to take how to take this when to take this reasons to take this additional instructions   gabapentin 300 MG capsule Commonly known as: NEURONTIN Take 1 capsule (300 mg total) by mouth daily. Fill one rx. Computer may have sent over 2 What changed:  when to take this reasons to take this additional instructions   hydrOXYzine 10 MG tablet Commonly known as: ATARAX 1 tab po q hs prn itching What changed:  how much to take how to take this when to take this reasons to take this additional instructions   levothyroxine 125 MCG tablet Commonly known as: Synthroid Take 1 tablet (125 mcg total) by mouth daily before breakfast.   lidocaine-prilocaine cream Commonly known as: EMLA Apply 1 Application topically 3 (three) times a week.   nitroGLYCERIN 0.4 MG  SL tablet Commonly known as: NITROSTAT Place 1 tablet (0.4 mg total) under the tongue every 5 (five) minutes as needed for chest pain.   pantoprazole 40 MG tablet Commonly known as: Protonix Take 1 tablet (40 mg total) by mouth 2 (two) times daily before a meal.   rOPINIRole 0.25 MG tablet Commonly known as: REQUIP Take 0.25 mg by mouth at bedtime.   traZODone 50 MG tablet Commonly known as: DESYREL Take 0.5-1 tablets (25-50 mg total) by mouth at bedtime as needed for sleep.   Xiidra 5 % Soln  Generic drug: Lifitegrast Place 1 drop into both eyes at bedtime.          Time coordinating discharge: 39 minutes  Signed:  Karlea Mckibbin  [facility] 03/09/2022, 10:01 AM

## 2022-03-10 ENCOUNTER — Telehealth: Payer: Self-pay | Admitting: Nephrology

## 2022-03-10 NOTE — Telephone Encounter (Signed)
Transition of Care Contact from [facility]  Date of Discharge: 03/09/22 Date of Contact: 03/10/22 Method of contact: phone - attempted  Attempted to contact patient to discuss transition of care from inpatient admission.  Patient did not answer the phone.  Will attempt to call them again and if unable to reach will follow up at dialysis.  Jen Mow, PA-C [name]

## 2022-03-12 ENCOUNTER — Encounter (HOSPITAL_COMMUNITY): Payer: Self-pay | Admitting: Gastroenterology

## 2022-03-13 ENCOUNTER — Telehealth: Payer: Self-pay

## 2022-03-13 NOTE — Telephone Encounter (Signed)
Transition Care Management Follow-up Telephone Call Date of discharge and from where: [facility] 03-09-22 Dx: GI bleed How have you been since you were released from the hospital? Doing better  Any questions or concerns? No  Items Reviewed: Did the pt receive and understand the discharge instructions provided? Yes  Medications obtained and verified? Yes  Other? No  Any new allergies since your discharge? No  Dietary orders reviewed? Yes Do you have support at home? Yes   Home Care and Equipment/Supplies: Were home health services ordered? no If so, what is the name of the agency? na  Has the agency set up a time to come to the patient's home? not applicable Were any new equipment or medical supplies ordered?  No What is the name of the medical supply agency? na Were you able to get the supplies/equipment? not applicable Do you have any questions related to the use of the equipment or supplies? No  Functional Questionnaire: (I = Independent and D = Dependent) ADLs: I  Bathing/Dressing- I  Meal Prep- I  Eating- I  Maintaining continence- I  Transferring/Ambulation- I  Managing Meds- I  Follow up appointments reviewed:  PCP Hospital f/u appt confirmed? Yes  Scheduled to see Dr Harvie Heck on 03-16-22 @ [facility] f/u appt confirmed? No  . Are transportation arrangements needed? No  If their condition worsens, is the pt aware to call PCP or go to the Emergency Dept.? Yes Was the patient provided with contact information for the PCP's office or ED? Yes Was to pt encouraged to call back with questions or concerns? Yes   Juanda Crumble LPN [facility] [phone]

## 2022-03-16 ENCOUNTER — Ambulatory Visit (INDEPENDENT_AMBULATORY_CARE_PROVIDER_SITE_OTHER): Payer: Medicare Other | Admitting: Medical

## 2022-03-16 ENCOUNTER — Telehealth: Payer: Self-pay

## 2022-03-16 ENCOUNTER — Encounter: Payer: Self-pay | Admitting: Medical

## 2022-03-16 VITALS — BP 140/77 | HR 72 | Temp 97.8°F | Resp 18 | Ht 59.0 in | Wt 125.6 lb

## 2022-03-16 DIAGNOSIS — N186 End stage renal disease: Secondary | ICD-10-CM

## 2022-03-16 DIAGNOSIS — R5383 Other fatigue: Secondary | ICD-10-CM

## 2022-03-16 DIAGNOSIS — K922 Gastrointestinal hemorrhage, unspecified: Secondary | ICD-10-CM | POA: Diagnosis not present

## 2022-03-16 DIAGNOSIS — D500 Iron deficiency anemia secondary to blood loss (chronic): Secondary | ICD-10-CM

## 2022-03-16 LAB — MAGNESIUM: Magnesium: 2.2 mg/dL (ref 1.5–2.5)

## 2022-03-16 LAB — COMPREHENSIVE METABOLIC PANEL
ALT: 9 U/L (ref 0–35)
AST: 17 U/L (ref 0–37)
Albumin: 4.3 g/dL (ref 3.5–5.2)
Alkaline Phosphatase: 88 U/L (ref 39–117)
BUN: 12 mg/dL (ref 6–23)
CO2: 36 mEq/L — ABNORMAL HIGH (ref 19–32)
Calcium: 8.7 mg/dL (ref 8.4–10.5)
Chloride: 96 mEq/L (ref 96–112)
Creatinine, Ser: 4.73 mg/dL (ref 0.40–1.20)
GFR: 8.62 mL/min — CL (ref >60.00)
Glucose, Bld: 103 mg/dL — ABNORMAL HIGH (ref 70–99)
Potassium: 3.4 mEq/L — ABNORMAL LOW (ref 3.5–5.1)
Sodium: 140 mEq/L (ref 135–145)
Total Bilirubin: 0.6 mg/dL (ref 0.2–1.2)
Total Protein: 8 g/dL (ref 6.0–8.3)

## 2022-03-16 LAB — CBC WITH DIFFERENTIAL/PLATELET
Basophils Absolute: 0 10*3/uL (ref 0.0–0.1)
Basophils Relative: 0.4 % (ref 0.0–3.0)
Eosinophils Absolute: 0.2 10*3/uL (ref 0.0–0.7)
Eosinophils Relative: 2.7 % (ref 0.0–5.0)
HCT: 30.6 % — ABNORMAL LOW (ref 36.0–46.0)
Hemoglobin: 9.8 g/dL — ABNORMAL LOW (ref 12.0–15.0)
Lymphocytes Relative: 21.6 % (ref 12.0–46.0)
Lymphs Abs: 1.3 10*3/uL (ref 0.7–4.0)
MCHC: 31.9 g/dL (ref 30.0–36.0)
MCV: 96.1 fl (ref 78.0–100.0)
Monocytes Absolute: 0.5 10*3/uL (ref 0.1–1.0)
Monocytes Relative: 8.2 % (ref 3.0–12.0)
Neutro Abs: 4 10*3/uL (ref 1.4–7.7)
Neutrophils Relative %: 67.1 % (ref 43.0–77.0)
Platelets: 155 10*3/uL (ref 150.0–400.0)
RBC: 3.19 Mil/uL — ABNORMAL LOW (ref 3.87–5.11)
RDW: 19 % — ABNORMAL HIGH (ref 11.5–15.5)
WBC: 6 10*3/uL (ref 4.0–10.5)

## 2022-03-16 LAB — IRON: Iron: 82 ug/dL (ref 42–145)

## 2022-03-16 NOTE — Progress Notes (Signed)
Subjective:    Patient ID: Morgan Clay, female    DOB: [birth year], 76 y.o.   MRN: [id number]  HPI Pt in for follow up from hospitalization. Date admission 03/06/2022. Discharged- 03/09/2022.  Hpi.    History of Present Illness:    Morgan Clay is a 75 y.o. female with medical history significant of atrial fibrillation on Eliquis, ESRD on HD (MWF), nonischemic cardiomyopathy s/p ICD placement/biventricular pacemaker (last echo on 12/22/2021 showed LVEF of 50 to 55%), CHF, history of recurrent GI bleeds due to gastric/small bowel AVMs and diverticular bleed presented to the hospital from GI office with complaints of mild abdominal pain and black tarry stools, dizziness palpitations.  In the ED patient was hemodynamically stable.  Work-up showed hemoglobin of 8.5.  Creatinine of 1.8.  Chest x-ray with mild cardiomegaly.  Patient was then admitted hospital for further evaluation and treatment.   ospital Course:    Following conditions were addressed during hospitalization as listed below,   Acute GI bleed, history of chronic anemia secondary to chronic GI blood loss and ESRD Thought to be secondary to diverticulosis.  Initial hemoglobin on presentation was 8.5.  Hemoglobin  was 13.3 on 12/29/2021.   Hemoccult positive.  Was seen by GI and underwent endoscopy evaluation 03/08/2022 findings of nonbleeding AVMs status post APC treatment..  Hemoglobin prior to discharge was 7.9.  Has not had a bowel movement since yesterday.  Plan is to continue PPI on discharge and resume Eliquis in 2 days.   History of hepatitis C/cirrhosis of liver.  Status post treatment.   A-fib on Eliquis Resume Eliquis after discharge in 2 days. Continue amiodarone.   ESRD on HD (MWF)  Nephrology followed the patient during hospitalization. Received hemodialysis.  On Monday Wednesday Friday schedule.  We will continue hemodialysis after discharge.   Prolonged QTc 563 ms Has AICD.  Electrolytes were  replenished.   Acquired hypothyroidism Continue Synthroid   Mild hyponatremia.  Sodium 131 prior to discharge.  Recommend outpatient follow-up.   Disposition.  At this time, patient is stable for disposition home with outpatient PCP follow-up.   Plan on dc below    ecommendations for Outpatient Follow-Up:    Follow up with your primary care provider in one week.  Check CBC, BMP, magnesium in the next visit   Discharge Diagnosis:    Principal Problem:   GI bleed-resolved. Active Problems:   Atrial fibrillation, chronic   Acquired hypothyroidism   Melena   Anemia   ESRD on dialysis ([place])   AICD (automatic cardioverter/defibrillator) present   Prolonged QT interval   Rectal bleeding Hyponatremia.   Discharge Condition: Improved.   Diet recommendation: Low sodium, heart healthy.    Wound care: None.   Code status: Full.     History of Present Illness:    Morgan Clay is a 76 y.o. female with medical history significant of atrial fibrillation on Eliquis, ESRD on HD (MWF), nonischemic cardiomyopathy s/p ICD placement/biventricular pacemaker (last echo on 12/22/2021 showed LVEF of 50 to 55%), CHF, history of recurrent GI bleeds due to gastric/small bowel AVMs and diverticular bleed presented to the hospital from GI office with complaints of mild abdominal pain and black tarry stools, dizziness palpitations.  In the ED patient was hemodynamically stable.  Work-up showed hemoglobin of 8.5.  Creatinine of 1.8.  Chest x-ray with mild cardiomegaly.  Patient was then admitted hospital for further evaluation and treatment.    Hospital Course:    Following conditions were addressed during hospitalization  as listed below,   Acute GI bleed, history of chronic anemia secondary to chronic GI blood loss and ESRD Thought to be secondary to diverticulosis.  Initial hemoglobin on presentation was 8.5.  Hemoglobin  was 13.3 on 12/29/2021.   Hemoccult positive.  Was seen by GI and  underwent endoscopy evaluation 03/08/2022 findings of nonbleeding AVMs status post APC treatment..  Hemoglobin prior to discharge was 7.9.  Has not had a bowel movement since yesterday.  Plan is to continue PPI on discharge and resume Eliquis in 2 days.   History of hepatitis C/cirrhosis of liver.  Status post treatment.   A-fib on Eliquis Resume Eliquis after discharge in 2 days. Continue amiodarone.   ESRD on HD (MWF)  Nephrology followed the patient during hospitalization. Received hemodialysis.  On Monday Wednesday Friday schedule.  We will continue hemodialysis after discharge.   Prolonged QTc 563 ms Has AICD.  Electrolytes were replenished.   Acquired hypothyroidism Continue Synthroid   Mild hyponatremia.  Sodium 131 prior to discharge.  Recommend outpatient follow-up.   Disposition.  At this time, patient is stable for disposition home with outpatient PCP follow-up.  Medication dc list.  amiodarone 200 MG tablet Commonly known as: PACERONE Take 1 tablet (200 mg total) by mouth daily.    apixaban 2.5 MG Tabs tablet Commonly known as: Eliquis Take 1 tablet (2.5 mg total) by mouth 2 (two) times daily. Start taking on: March 11, 2022 What changed: These instructions start on March 11, 2022. If you are unsure what to do until then, ask your doctor or other care provider.    cyclobenzaprine 5 MG tablet Commonly known as: FLEXERIL Take 5 mg by mouth at bedtime as needed for muscle spasms.    diazepam 5 MG tablet Commonly known as: Valium 1 tab po bid prn anxiety What changed:  how much to take how to take this when to take this reasons to take this additional instructions    gabapentin 300 MG capsule Commonly known as: NEURONTIN Take 1 capsule (300 mg total) by mouth daily. Fill one rx. Computer may have sent over 2 What changed:  when to take this reasons to take this additional instructions    hydrOXYzine 10 MG tablet Commonly known as: ATARAX 1 tab po q  hs prn itching What changed:  how much to take how to take this when to take this reasons to take this additional instructions    levothyroxine 125 MCG tablet Commonly known as: Synthroid Take 1 tablet (125 mcg total) by mouth daily before breakfast.    lidocaine-prilocaine cream Commonly known as: EMLA Apply 1 Application topically 3 (three) times a week.    nitroGLYCERIN 0.4 MG SL tablet Commonly known as: NITROSTAT Place 1 tablet (0.4 mg total) under the tongue every 5 (five) minutes as needed for chest pain.    pantoprazole 40 MG tablet Commonly known as: Protonix Take 1 tablet (40 mg total) by mouth 2 (two) times daily before a meal.    rOPINIRole 0.25 MG tablet Commonly known as: REQUIP Take 0.25 mg by mouth at bedtime.    traZODone 50 MG tablet Commonly known as: DESYREL Take 0.5-1 tablets (25-50 mg total) by mouth at bedtime as needed for sleep.    Xiidra 5 % Soln Generic drug: Lifitegrast Place 1 drop into both eyes at bedtime.      Pt still states she is feeling fatigued after DC.  Pt no near syncope or lightheaded sensation. No chest pain or shortness  of breath.  Doing dialysis tomorrow.     Review of Systems  Constitutional:  Positive for fatigue. Negative for chills and fever.  HENT:  Negative for congestion and drooling.   Respiratory:  Negative for cough, chest tightness, shortness of breath and wheezing.   Cardiovascular:  Negative for chest pain and palpitations.  Gastrointestinal:  Negative for abdominal pain.  Genitourinary:  Negative for dysuria.  Musculoskeletal:  Negative for back pain and joint swelling.  Neurological:  Negative for dizziness, speech difficulty and headaches.  Hematological:  Negative for adenopathy. Does not bruise/bleed easily.  Psychiatric/Behavioral:  Negative for behavioral problems, decreased concentration and dysphoric mood.     Past Medical History:  Diagnosis Date   Acute blood loss anemia 09/24/2020    AICD (automatic cardioverter/defibrillator) present 10/06/2015   Anemia due to chronic blood loss 04/15/2020   Anemia in chronic kidney disease 01/18/2018   Angiodysplasia of small intestine 06/10/2020   Ileum - seen on capsule endoscopy 05/2020 - ablated at colonoscopy   Aortic atherosclerosis ([place]) 08/30/2017   Arthritis    "qwhere" (01/03/2018)   Asthma    reports mild asthma since childhood - had COPD on dx list from prior PCP   Atrial fibrillation with RVR ([place]) 11/17/2020   Atrial fibrillation, chronic 10/25/2017   Atypical atrial flutter ([place]) 09/28/2015   AVM (arteriovenous malformation) of small bowel, acquired 01/14/2018   Biventricular ICD (implantable cardioverter-defibrillator) in place 01/08/2007   Qualifier: Diagnosis of  By: Hassell Done FNP, Nykedtra     Bleeding pseudoaneurysm of left brachiocephalic arteriovenous fistula ([place]) 09/24/2020   Bleeding pseudoaneurysm of left brachiocephalic AV fistula ([place]) 09/24/2020   Carpal tunnel syndrome, bilateral 06/09/2021   Chronic bronchitis ([place])    "get it most years; not this past year though" (01/03/2018)   Chronic diastolic CHF (congestive heart failure) ([place]) 04/24/2007   Annotation: secondary to nonischemic cardiomyopathy s/p CRT-D Followed by Dr. Caryl Comes in Cardiology Qualifier: Diagnosis of  By: Hassell Done FNP, Nykedtra     Chronic obstructive pulmonary disease ([place])    Chronic systolic congestive heart failure ([place])    CKD (chronic kidney disease) stage 4, GFR 15-29 ml/min  11/24/2013   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 (HCV) ([place])    HEPATITIS C - s/p treatment with Harvoni, saw hepatology, Dawn Drazek   COVID-19 virus infection 05/27/2020   Diabetes mellitus without complication ([place])    DIET CONTROLLED    Dialysis complication    Diverticulitis of left colon status post robotic low anterior to sigmoid resection 05/22/2018 07/31/2017   End stage renal disease ([place]) 03/22/2021   FIBROIDS, UTERUS 03/05/2008    Glaucoma    Gout    History of blood transfusion ~ 11/2017   Hypertension associated with diabetes ([place]) 06/07/2017   Hypothyroidism    LBBB (left bundle branch block)    Lesion of ulnar nerve 06/09/2021   Long term current use of anticoagulant    Lower GI bleeding    "been dealing w/it since 07/2017" (01/03/2018)   Malnutrition of moderate degree 07/09/2020   MDD (major depressive disorder), recurrent, in partial remission ([place]) 06/07/2017   Nonischemic cardiomyopathy ([place])    Obesity 05/27/2009   Qualifier: Diagnosis of  By: Hassell Done FNP, Nykedtra     OBSTRUCTIVE SLEEP APNEA 11/14/2007   no CPAP   OSTEOPENIA 09/30/2008   Other cirrhosis of liver ([place]) 08/12/2020   Formatting of this note is different from the original. Patient with history of cirrhosis secondary to hepatitis C with  no history of hepatic decompensation.  She has had some hyponatremia and hypoalbuminemia in the past however most recent labs show relatively well-preserved liver synthetic function.  She will have labs to update liver synthetic function.  Hepatoma screening - patient is currentl   Overweight (BMI 25.0-29.9) 03/02/2020   Pain in right knee 05/13/2018   Persistent atrial fibrillation ([place]) 02/06/2020   Pneumonia    "several times" (01/03/2018)   Polyneuropathy associated with underlying disease ([place]) 06/09/2021   Pulmonary hypertension ([place]) on echocardiogram 01/14/2018   Secondary esophageal varices without bleeding ([place]) 12/23/2020   Formatting of this note might be different from the original. Patient had trace esophageal varices during small bowel enteroscopy in October 2021, follow-up EGD in January 2022 with no varices.  She previously had GI bleeding thought to be from AVM versus GAVE.  She is no longer on carvedilol and will need surveillance endoscopy.  Patient was previously followed at [facility] and I have asked her    Secondary hypercoagulable state ([place]) 02/06/2020   Type 2 diabetes, controlled, with  renal manifestation ([place]) 11/24/2013     Social History   Socioeconomic History   Marital status: Divorced    Spouse name: Not on file   Number of children: 1   Years of education: 12   Highest education level: Not on file  Occupational History   Not on file  Tobacco Use   Smoking status: Never   Smokeless tobacco: Never  Vaping Use   Vaping Use: Never used  Substance and Sexual Activity   Alcohol use: Not Currently    Comment: 01/03/2018 "couple drinks/month"   Drug use: Not Currently    Types: Marijuana    Comment: VERY INTERMITTENT    Sexual activity: Not Currently  Other Topics Concern   Not on file  Social History Narrative   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      Home Situation: lives alone      Spiritual Beliefs: Christian - [employer]      Lifestyle: no regular exercise; poor diet      She is divorced at least one adult child   Never smoker    rare alcohol to occasional at best    no substance abuse         Social Determinants of Health   Financial Resource Strain: Low Risk  (07/15/2021)   Overall Financial Resource Strain (CARDIA)    Difficulty of Paying Living Expenses: Not hard at all  Food Insecurity: No Food Insecurity (07/15/2021)   Hunger Vital Sign    Worried About Running Out of Food in the Last Year: Never true    Ran Out of Food in the Last Year: Never true  Transportation Needs: No Transportation Needs (07/15/2021)   PRAPARE - Hydrologist (Medical): No    Lack of Transportation (Non-Medical): No  Physical Activity: Not on file  Stress: No Stress Concern Present (07/15/2021)   [place]    Feeling of Stress : Not at all  Social Connections: Moderately Isolated (07/15/2021)   Social Connection and Isolation Panel [NHANES]    Frequency of Communication with Friends and Family: More than three times a week    Frequency of Social Gatherings with  Friends and Family: Three times a week    Attends Religious Services: More than 4 times per year    Active Member of Clubs or Organizations: No  Attends Archivist Meetings: Never    Marital Status: Divorced  Human resources officer Violence: Not At Risk (07/15/2021)   Humiliation, Afraid, Rape, and Kick questionnaire    Fear of Current or Ex-Partner: No    Emotionally Abused: No    Physically Abused: No    Sexually Abused: No    Past Surgical History:  Procedure Laterality Date   A/V FISTULAGRAM N/A 03/28/2021   Procedure: A/V FISTULAGRAM;  Surgeon: Waynetta Sandy, MD;  Location: [facility];  Service: Cardiovascular;  Laterality: N/A;   APPENDECTOMY     AV FISTULA PLACEMENT Left 08/05/2020   Procedure: LEFT UPPER EXTREMITY ARTERIOVENOUS (AV) FISTULA CREATION;  Surgeon: Cherre Robins, MD;  Location: [place];  Service: Vascular;  Laterality: Left;   [place] Left 09/16/2020   Procedure: LEFT SECOND STAGE [place];  Surgeon: Cherre Robins, MD;  Location: [place];  Service: Vascular;  Laterality: Left;  PERIPHERAL NERVE BLOCK   CARDIAC CATHETERIZATION     CARDIOVERSION N/A 12/14/2014   Procedure: CARDIOVERSION;  Surgeon: Dorothy Spark, MD;  Location: [place];  Service: Cardiovascular;  Laterality: N/A;   CARDIOVERSION N/A 02/26/2017   Procedure: CARDIOVERSION;  Surgeon: Evans Lance, MD;  Location: [facility];  Service: Cardiovascular;  Laterality: N/A;   CARDIOVERSION N/A 07/24/2017   Procedure: CARDIOVERSION;  Surgeon: Acie Fredrickson Wonda Cheng, MD;  Location[facility] ENDOSCOPY;  Service: Cardiovascular;  Laterality: N/A;   CARDIOVERSION N/A 02/12/2020   Procedure: CARDIOVERSION;  Surgeon: [name], MD;  Location: [facility] ENDOSCOPY;  Service: Cardiovascular;  Laterality: N/A;   CARDIOVERSION N/A 09/15/2021   Procedure: CARDIOVERSION;  Surgeon: [name], MD;  Location: [facility] ENDOSCOPY;  Service: Cardiovascular;   Laterality: N/A;   CARPAL TUNNEL RELEASE  07/21/2021   COLONOSCOPY N/A 11/08/2017   Procedure: COLONOSCOPY;  Surgeon: Milus Banister, MD;  Location: [facility] ENDOSCOPY;  Service: Endoscopy;  Laterality: N/A;   COLONOSCOPY WITH PROPOFOL N/A 05/31/2020   Procedure: COLONOSCOPY WITH PROPOFOL;  Surgeon: Irene Shipper, MD;  Location: [facility] ENDOSCOPY;  Service: Endoscopy;  Laterality: N/A;   COLONOSCOPY WITH PROPOFOL N/A 10/26/2021   Procedure: COLONOSCOPY WITH PROPOFOL;  Surgeon: Sharyn Creamer, MD;  Location: [place];  Service: Gastroenterology;  Laterality: N/A;   DILATION AND CURETTAGE OF UTERUS     ENTEROSCOPY N/A 03/02/2020   Procedure: ENTEROSCOPY;  Surgeon: Yetta Flock, MD;  Location: [facility] ENDOSCOPY;  Service: Gastroenterology;  Laterality: N/A;   EP IMPLANTABLE DEVICE N/A 10/06/2015   Procedure: BIV ICD Generator Changeout;  Surgeon: [name], MD;  Location: [place] [facility];  Service: Cardiovascular;  Laterality: N/A;   ESOPHAGOGASTRODUODENOSCOPY N/A 10/26/2017   Procedure: ESOPHAGOGASTRODUODENOSCOPY (EGD);  Surgeon: Ladene Artist, MD;  Location[facility];  Service: Endoscopy;  Laterality: N/A;   ESOPHAGOGASTRODUODENOSCOPY (EGD) WITH PROPOFOL N/A 11/07/2017   Procedure: ESOPHAGOGASTRODUODENOSCOPY (EGD) WITH PROPOFOL;  Surgeon: Milus Banister, MD;  [facility];  Service: Endoscopy;  Laterality: N/A;   ESOPHAGOGASTRODUODENOSCOPY (EGD) WITH PROPOFOL N/A 05/29/2020   Procedure: ESOPHAGOGASTRODUODENOSCOPY (EGD) WITH PROPOFOL;  Surgeon: Doran Stabler, MD;  Location: [place];  Service: Gastroenterology;  Laterality: N/A;   ESOPHAGOGASTRODUODENOSCOPY (EGD) WITH PROPOFOL N/A 10/26/2021   Procedure: ESOPHAGOGASTRODUODENOSCOPY (EGD) WITH PROPOFOL;  Surgeon: Sharyn Creamer, MD;  Location: [place];  Service: Gastroenterology;  Laterality: N/A;   ESOPHAGOGASTRODUODENOSCOPY (EGD) WITH PROPOFOL N/A 03/08/2022   Procedure: ESOPHAGOGASTRODUODENOSCOPY (EGD) WITH  PROPOFOL;  Surgeon: Jackquline Denmark, MD;  Location: [facility] ENDOSCOPY;  Service: Gastroenterology;  Laterality: N/A;   GIVENS CAPSULE STUDY N/A 05/19/2020   Procedure: GIVENS CAPSULE STUDY;  Surgeon: [name], MD;  Location: [place];  Service: Endoscopy;  Laterality: N/A;  .adm for obs since pacemaker, PA wil enter order and see pt   GIVENS CAPSULE STUDY N/A 05/29/2020   Procedure: GIVENS CAPSULE STUDY;  Surgeon: Doran Stabler, MD;  Location: [place];  Service: Gastroenterology;  Laterality: N/A;   HEMATOMA EVACUATION Left 09/24/2020   Procedure: EVACUATION HEMATOMA ARM;  Surgeon: Rosetta Posner, MD;  Location: [facility] OR;  Service: Vascular;  Laterality: Left;   HOT HEMOSTASIS N/A 10/26/2017   Procedure: HOT HEMOSTASIS (ARGON PLASMA COAGULATION/BICAP);  Surgeon: Ladene Artist, MD;  Location: [facility];  Service: Endoscopy;  Laterality: N/A;   HOT HEMOSTASIS N/A 03/02/2020   Procedure: HOT HEMOSTASIS (ARGON PLASMA COAGULATION/BICAP);  Surgeon: Yetta Flock, MD;  Location: [facility] ENDOSCOPY;  Service: Gastroenterology;  Laterality: N/A;   HOT HEMOSTASIS N/A 05/31/2020   Procedure: HOT HEMOSTASIS (ARGON PLASMA COAGULATION/BICAP);  Surgeon: Irene Shipper, MD;  Location: [facility] ENDOSCOPY;  Service: Endoscopy;  Laterality: N/A;   HOT HEMOSTASIS N/A 03/08/2022   Procedure: HOT HEMOSTASIS (ARGON PLASMA COAGULATION/BICAP);  Surgeon: Jackquline Denmark, MD;  Location: [facility] ENDOSCOPY;  Service: Gastroenterology;  Laterality: N/A;   INSERT / REPLACE / REMOVE PACEMAKER  ?2008   IR PERC TUN PERIT CATH WO PORT S&I /IMAG  07/05/2020   PERIPHERAL VASCULAR INTERVENTION Left 03/28/2021   Procedure: PERIPHERAL VASCULAR INTERVENTION;  Surgeon: Waynetta Sandy, MD;  Location: [place] [facility];  Service: Cardiovascular;  Laterality: Left;   POLYPECTOMY  11/08/2017   Procedure: POLYPECTOMY;  Surgeon: Milus Banister, MD;  [facility];  Service: Endoscopy;;   PROCTOSCOPY N/A 05/22/2018    Procedure: RIGID PROCTOSCOPY;  Surgeon: Michael Boston, MD;  Location: [facility];  Service: General;  Laterality: N/A;   RIGHT HEART CATH N/A 02/13/2018   Procedure: RIGHT HEART CATH;  Surgeon: [name], MD;  Location: [place] [facility];  Service: Cardiovascular;  Laterality: N/A;   RIGHT HEART CATH N/A 06/23/2020   Procedure: RIGHT HEART CATH;  Surgeon: [name], MD;  Location: [place] [facility];  Service: Cardiovascular;  Laterality: N/A;   RIGHT/LEFT HEART CATH AND CORONARY ANGIOGRAPHY N/A 12/11/2019   Procedure: RIGHT/LEFT HEART CATH AND CORONARY ANGIOGRAPHY;  Surgeon: [name], MD;  Location: [place] [facility];  Service: Cardiovascular;  Laterality: N/A;   SUBMUCOSAL TATTOO INJECTION  03/02/2020   Procedure: SUBMUCOSAL TATTOO INJECTION;  Surgeon: Yetta Flock, MD;  Location: [facility] ENDOSCOPY;  Service: Gastroenterology;;   SUBMUCOSAL TATTOO INJECTION  05/31/2020   Procedure: SUBMUCOSAL TATTOO INJECTION;  Surgeon: Irene Shipper, MD;  Location: [place];  Service: Endoscopy;;   TEE WITHOUT CARDIOVERSION N/A 08/02/2021   Procedure: TRANSESOPHAGEAL ECHOCARDIOGRAM (TEE);  Surgeon: [name], MD;  Location: [facility] ENDOSCOPY;  Service: Cardiovascular;  Laterality: N/A;   TUBAL LIGATION     XI ROBOTIC ASSISTED LOWER ANTERIOR RESECTION N/A 05/22/2018   Procedure: XI ROBOTIC ASSISTED SIGMOID COLOECTOMY MOBILIZATION OF SPLENIC FLEXURE, FIREFLY ASSESSMENT OF PERFUSION;  Surgeon: Michael Boston, MD;  Location: [facility];  Service: General;  Laterality: N/A;  ERAS PATHWAY    Family History  Problem Relation Age of Onset   Asthma Father    Heart attack Father    Asthma Sister    Lung cancer Sister    Heart attack Mother  Stroke Brother    Colon cancer Neg Hx    Esophageal cancer Neg Hx    Pancreatic cancer Neg Hx    Stomach cancer Neg Hx    Liver disease Neg Hx     No Known Allergies  Current Outpatient Medications on File Prior to Visit  Medication Sig  Dispense Refill   amiodarone (PACERONE) 200 MG tablet Take 1 tablet (200 mg total) by mouth daily. 90 tablet 3   apixaban (ELIQUIS) 2.5 MG TABS tablet Take 1 tablet (2.5 mg total) by mouth 2 (two) times daily. 60 tablet 11   cyclobenzaprine (FLEXERIL) 5 MG tablet Take 5 mg by mouth at bedtime as needed for muscle spasms.     diazepam (VALIUM) 5 MG tablet 1 tab po bid prn anxiety (Patient taking differently: Take 5 mg by mouth 2 (two) times daily as needed for anxiety.) 10 tablet 0   gabapentin (NEURONTIN) 300 MG capsule Take 1 capsule (300 mg total) by mouth daily. Fill one rx. Computer may have sent over 2 (Patient taking differently: Take 300 mg by mouth daily as needed (pain).) 90 capsule 2   hydrOXYzine (ATARAX) 10 MG tablet 1 tab po q hs prn itching (Patient taking differently: Take 10 mg by mouth at bedtime as needed for itching.) 10 tablet 0   levothyroxine (SYNTHROID) 125 MCG tablet Take 1 tablet (125 mcg total) by mouth daily before breakfast. 30 tablet 11   lidocaine-prilocaine (EMLA) cream Apply 1 Application topically 3 (three) times a week.     nitroGLYCERIN (NITROSTAT) 0.4 MG SL tablet Place 1 tablet (0.4 mg total) under the tongue every 5 (five) minutes as needed for chest pain. 25 tablet 0   pantoprazole (PROTONIX) 40 MG tablet Take 1 tablet (40 mg total) by mouth 2 (two) times daily before a meal. 30 tablet 2   rOPINIRole (REQUIP) 0.25 MG tablet Take 0.25 mg by mouth at bedtime.     traZODone (DESYREL) 50 MG tablet Take 0.5-1 tablets (25-50 mg total) by mouth at bedtime as needed for sleep. 30 tablet 3   XIIDRA 5 % SOLN Place 1 drop into both eyes at bedtime.     No current facility-administered medications on file prior to visit.    BP (!) 140/77   Pulse 72   Temp 97.8 F (36.6 C) (Oral)   Resp 18   Ht '4\' 11"'$  (1.499 m)   Wt 125 lb 9.6 oz (57 kg)   SpO2 100%   BMI 25.37 kg/m        Objective:   Physical Exam  General Mental Status- Alert. General Appearance- Not in  acute distress.   Skin General: Color- Normal Color. Moisture- Normal Moisture.  Neck Carotid Arteries- Normal color. Moisture- Normal Moisture. No carotid bruits. No JVD.  Chest and Lung Exam Auscultation: Breath Sounds:-Normal.  Cardiovascular Auscultation:Rythm- Regular. Murmurs & Other Heart Sounds:Auscultation of the heart reveals- No Murmurs.  Abdomen Inspection:-Inspeection Normal. Palpation/Percussion:Note:No mass. Palpation and Percussion of the abdomen reveal- Non Tender, Non Distended + BS, no rebound or guarding.   Neurologic Cranial Nerve exam:- CN III-XII intact(No nystagmus), symmetric smile. Strength:- 5/5 equal and symmetric strength both upper and lower extremities.       Assessment & Plan:   Patient Instructions  Recent  hospitalization for anemia and GI bleed. Clinically stable but fatigue continues. Will repeat your cbc today today and refer you back to GI MD.  For ESRD continue with dialysis.   Cmp and mag as well  per dc note recommendations.  Continue meds reviewed on your DC summary/list.  Follow up date to be determined after lab review.   Mackie Pai, PA-C

## 2022-03-16 NOTE — Telephone Encounter (Signed)
ES pt:    CRITICAL VALUE STICKER  CRITICAL VALUE: Creatinine- 4.73, GFR- 8.62  RECEIVER (on-site recipient of call): Kristine Garbe, [place] NOTIFIED: 03/16/22, 4:30 PM  MESSENGER (representative from lab): HOPE  MD NOTIFIED: Yes  TIME OF NOTIFICATION: 03/16/22, 4:30 PM  RESPONSE: pending.

## 2022-03-16 NOTE — Patient Instructions (Addendum)
Recent  hospitalization for anemia and GI bleed. Clinically stable but fatigue continues. Will repeat your cbc today today and refer you back to GI MD.  For ESRD continue with dialysis.   Cmp and mag as well per dc note recommendations.  Continue meds reviewed on your DC summary/list.  Follow up date to be determined after lab review.

## 2022-03-17 NOTE — Telephone Encounter (Signed)
ES has also reviewed and sent a MyChart message to the pt.

## 2022-03-20 ENCOUNTER — Ambulatory Visit (HOSPITAL_BASED_OUTPATIENT_CLINIC_OR_DEPARTMENT_OTHER)
Admission: RE | Admit: 2022-03-20 | Discharge: 2022-03-20 | Disposition: A | Discharge status: Home / Self Care | Payer: Medicare Other | Source: Ambulatory Visit | Attending: Medical | Admitting: Medical

## 2022-03-20 ENCOUNTER — Ambulatory Visit (INDEPENDENT_AMBULATORY_CARE_PROVIDER_SITE_OTHER): Payer: Medicare Other | Admitting: Medical

## 2022-03-20 VITALS — BP 140/56 | HR 86 | Temp 98.4°F | Resp 18 | Ht 59.0 in | Wt 125.0 lb

## 2022-03-20 DIAGNOSIS — J209 Acute bronchitis, unspecified: Secondary | ICD-10-CM

## 2022-03-20 DIAGNOSIS — I517 Cardiomegaly: Secondary | ICD-10-CM | POA: Diagnosis not present

## 2022-03-20 DIAGNOSIS — J440 Chronic obstructive pulmonary disease with acute lower respiratory infection: Secondary | ICD-10-CM | POA: Diagnosis not present

## 2022-03-20 DIAGNOSIS — R059 Cough, unspecified: Secondary | ICD-10-CM | POA: Insufficient documentation

## 2022-03-20 DIAGNOSIS — N186 End stage renal disease: Secondary | ICD-10-CM | POA: Diagnosis not present

## 2022-03-20 LAB — CBC WITH DIFFERENTIAL/PLATELET
Basophils Absolute: 0 10*3/uL (ref 0.0–0.1)
Basophils Relative: 0.3 % (ref 0.0–3.0)
Eosinophils Absolute: 0.1 10*3/uL (ref 0.0–0.7)
Eosinophils Relative: 2 % (ref 0.0–5.0)
HCT: 28.4 % — ABNORMAL LOW (ref 36.0–46.0)
Hemoglobin: 9.1 g/dL — ABNORMAL LOW (ref 12.0–15.0)
Lymphocytes Relative: 17.8 % (ref 12.0–46.0)
Lymphs Abs: 1 10*3/uL (ref 0.7–4.0)
MCHC: 32.3 g/dL (ref 30.0–36.0)
MCV: 95.6 fl (ref 78.0–100.0)
Monocytes Absolute: 0.6 10*3/uL (ref 0.1–1.0)
Monocytes Relative: 11.2 % (ref 3.0–12.0)
Neutro Abs: 3.9 10*3/uL (ref 1.4–7.7)
Neutrophils Relative %: 68.7 % (ref 43.0–77.0)
Platelets: 144 10*3/uL — ABNORMAL LOW (ref 150.0–400.0)
RBC: 2.97 Mil/uL — ABNORMAL LOW (ref 3.87–5.11)
RDW: 18.8 % — ABNORMAL HIGH (ref 11.5–15.5)
WBC: 5.7 10*3/uL (ref 4.0–10.5)

## 2022-03-20 LAB — BRAIN NATRIURETIC PEPTIDE: Pro B Natriuretic peptide (BNP): 948 pg/mL — ABNORMAL HIGH (ref 0.0–100.0)

## 2022-03-20 MED ORDER — BENZONATATE 100 MG PO CAPS: 100.0000 mg | ORAL_CAPSULE | Freq: Three times a day (TID) | ORAL | 0 refills | Status: DC | PRN

## 2022-03-20 MED ORDER — DOXYCYCLINE HYCLATE 100 MG PO TABS: 100.0000 mg | ORAL_TABLET | Freq: Two times a day (BID) | ORAL | 0 refills | Status: DC

## 2022-03-20 MED ORDER — METHYLPREDNISOLONE 4 MG PO TABS: ORAL_TABLET | ORAL | 0 refills | Status: DC

## 2022-03-20 NOTE — Progress Notes (Signed)
Subjective:    Patient ID: Morgan Clay, female    DOB: [birth year], 76 y.o.   MRN: [id number]  HPI  Pt states Saturday she began with cough. Last night was productive. Today dry. No fever, no chills or sweats. Pt has some wheezing. No diffuse myalgias.  Sometimes will wheeze when gets sick.    Review of Systems  Constitutional:  Negative for chills and fatigue.  HENT:  Negative for congestion and ear pain.   Respiratory:  Positive for cough and wheezing. Negative for choking and shortness of breath.   Cardiovascular:  Negative for chest pain and palpitations.  Gastrointestinal:  Negative for abdominal pain.  Musculoskeletal:  Negative for back pain.  Neurological:  Negative for dizziness and headaches.  Hematological:  Negative for adenopathy. Does not bruise/bleed easily.    Past Medical History:  Diagnosis Date   Acute blood loss anemia 09/24/2020   AICD (automatic cardioverter/defibrillator) present 10/06/2015   Anemia due to chronic blood loss 04/15/2020   Anemia in chronic kidney disease 01/18/2018   Angiodysplasia of small intestine 06/10/2020   Ileum - seen on capsule endoscopy 05/2020 - ablated at colonoscopy   Aortic atherosclerosis ([place]) 08/30/2017   Arthritis    "qwhere" (01/03/2018)   Asthma    reports mild asthma since childhood - had COPD on dx list from prior PCP   Atrial fibrillation with RVR ([place]) 11/17/2020   Atrial fibrillation, chronic 10/25/2017   Atypical atrial flutter ([place]) 09/28/2015   AVM (arteriovenous malformation) of small bowel, acquired 01/14/2018   Biventricular ICD (implantable cardioverter-defibrillator) in place 01/08/2007   Qualifier: Diagnosis of  By: Hassell Done FNP, Nykedtra     Bleeding pseudoaneurysm of left brachiocephalic arteriovenous fistula ([place]) 09/24/2020   Bleeding pseudoaneurysm of left brachiocephalic AV fistula ([place]) 09/24/2020   Carpal tunnel syndrome, bilateral 06/09/2021   Chronic bronchitis ([place])    "get it most  years; not this past year though" (01/03/2018)   Chronic diastolic CHF (congestive heart failure) ([place]) 04/24/2007   Annotation: secondary to nonischemic cardiomyopathy s/p CRT-D Followed by Dr. Caryl Comes in Cardiology Qualifier: Diagnosis of  By: Hassell Done FNP, Nykedtra     Chronic obstructive pulmonary disease ([place])    Chronic systolic congestive heart failure ([place])    CKD (chronic kidney disease) stage 4, GFR 15-29 ml/min  11/24/2013   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 (HCV) ([place])    HEPATITIS C - s/p treatment with Harvoni, saw hepatology, Dawn Drazek   COVID-19 virus infection 05/27/2020   Diabetes mellitus without complication ([place])    DIET CONTROLLED    Dialysis complication    Diverticulitis of left colon status post robotic low anterior to sigmoid resection 05/22/2018 07/31/2017   End stage renal disease ([place]) 03/22/2021   FIBROIDS, UTERUS 03/05/2008   Glaucoma    Gout    History of blood transfusion ~ 11/2017   Hypertension associated with diabetes ([place]) 06/07/2017   Hypothyroidism    LBBB (left bundle branch block)    Lesion of ulnar nerve 06/09/2021   Long term current use of anticoagulant    Lower GI bleeding    "been dealing w/it since 07/2017" (01/03/2018)   Malnutrition of moderate degree 07/09/2020   MDD (major depressive disorder), recurrent, in partial remission ([place]) 06/07/2017   Nonischemic cardiomyopathy ([place])    Obesity 05/27/2009   Qualifier: Diagnosis of  By: Hassell Done FNP, Nykedtra     OBSTRUCTIVE SLEEP APNEA 11/14/2007   no CPAP   OSTEOPENIA 09/30/2008   Other cirrhosis  of liver ([place]) 08/12/2020   Formatting of this note is different from the original. Patient with history of cirrhosis secondary to hepatitis C with no history of hepatic decompensation.  She has had some hyponatremia and hypoalbuminemia in the past however most recent labs show relatively well-preserved liver synthetic function.  She will have labs to update liver synthetic function.   Hepatoma screening - patient is currentl   Overweight (BMI 25.0-29.9) 03/02/2020   Pain in right knee 05/13/2018   Persistent atrial fibrillation ([place]) 02/06/2020   Pneumonia    "several times" (01/03/2018)   Polyneuropathy associated with underlying disease ([place]) 06/09/2021   Pulmonary hypertension ([place]) on echocardiogram 01/14/2018   Secondary esophageal varices without bleeding ([place]) 12/23/2020   Formatting of this note might be different from the original. Patient had trace esophageal varices during small bowel enteroscopy in October 2021, follow-up EGD in January 2022 with no varices.  She previously had GI bleeding thought to be from AVM versus GAVE.  She is no longer on carvedilol and will need surveillance endoscopy.  Patient was previously followed at [place] and I have asked her    Secondary hypercoagulable state ([place]) 02/06/2020   Type 2 diabetes, controlled, with renal manifestation ([place]) 11/24/2013     Social History   Socioeconomic History   Marital status: Divorced    Spouse name: Not on file   Number of children: 1   Years of education: 12   Highest education level: Not on file  Occupational History   Not on file  Tobacco Use   Smoking status: Never   Smokeless tobacco: Never  Vaping Use   Vaping Use: Never used  Substance and Sexual Activity   Alcohol use: Not Currently    Comment: 01/03/2018 "couple drinks/month"   Drug use: Not Currently    Types: Marijuana    Comment: VERY INTERMITTENT    Sexual activity: Not Currently  Other Topics Concern   Not on file  Social History Narrative   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      Home Situation: lives alone      Spiritual Beliefs: Christian - [employer]      Lifestyle: no regular exercise; poor diet      She is divorced at least one adult child   Never smoker    rare alcohol to occasional at best    no substance abuse         Social Determinants of Health   Financial Resource Strain: Low Risk   (07/15/2021)   Overall Financial Resource Strain (CARDIA)    Difficulty of Paying Living Expenses: Not hard at all  Food Insecurity: No Food Insecurity (07/15/2021)   Hunger Vital Sign    Worried About Running Out of Food in the Last Year: Never true    Ran Out of Food in the Last Year: Never true  Transportation Needs: No Transportation Needs (07/15/2021)   PRAPARE - Hydrologist (Medical): No    Lack of Transportation (Non-Medical): No  Physical Activity: Not on file  Stress: No Stress Concern Present (07/15/2021)   [place]    Feeling of Stress : Not at all  Social Connections: Moderately Isolated (07/15/2021)   Social Connection and Isolation Panel [NHANES]    Frequency of Communication with Friends and Family: More than three times a week    Frequency of Social Gatherings with Friends and Family: Three times a week  Attends Religious Services: More than 4 times per year    Active Member of Clubs or Organizations: No    Attends Archivist Meetings: Never    Marital Status: Divorced  Human resources officer Violence: Not At Risk (07/15/2021)   Humiliation, Afraid, Rape, and Kick questionnaire    Fear of Current or Ex-Partner: No    Emotionally Abused: No    Physically Abused: No    Sexually Abused: No    Past Surgical History:  Procedure Laterality Date   A/V FISTULAGRAM N/A 03/28/2021   Procedure: A/V FISTULAGRAM;  Surgeon: Waynetta Sandy, MD;  Location: [place] [facility];  Service: Cardiovascular;  Laterality: N/A;   APPENDECTOMY     AV FISTULA PLACEMENT Left 08/05/2020   Procedure: LEFT UPPER EXTREMITY ARTERIOVENOUS (AV) FISTULA CREATION;  Surgeon: Cherre Robins, MD;  Location: [place];  Service: Vascular;  Laterality: Left;   [place] Left 09/16/2020   Procedure: LEFT SECOND STAGE [place];  Surgeon: Cherre Robins, MD;   Location: [place];  Service: Vascular;  Laterality: Left;  PERIPHERAL NERVE BLOCK   CARDIAC CATHETERIZATION     CARDIOVERSION N/A 12/14/2014   Procedure: CARDIOVERSION;  Surgeon: Dorothy Spark, MD;  Location: [place];  Service: Cardiovascular;  Laterality: N/A;   CARDIOVERSION N/A 02/26/2017   Procedure: CARDIOVERSION;  Surgeon: Evans Lance, MD;  Location: [place] [facility];  Service: Cardiovascular;  Laterality: N/A;   CARDIOVERSION N/A 07/24/2017   Procedure: CARDIOVERSION;  Surgeon: Acie Fredrickson Wonda Cheng, MD;  Location: [facility] ENDOSCOPY;  Service: Cardiovascular;  Laterality: N/A;   CARDIOVERSION N/A 02/12/2020   Procedure: CARDIOVERSION;  Surgeon: [name], MD;  Location[facility]COPY;  Service: Cardiovascular;  Laterality: N/A;   CARDIOVERSION N/A 09/15/2021   Procedure: CARDIOVERSION;  Surgeon: [name], MD;  Location: [facility] ENDOSCOPY;  Service: Cardiovascular;  Laterality: N/A;   CARPAL TUNNEL RELEASE  07/21/2021   COLONOSCOPY N/A 11/08/2017   Procedure: COLONOSCOPY;  Surgeon: Milus Banister, MD;  Location: [facility];  Service: Endoscopy;  Laterality: N/A;   COLONOSCOPY WITH PROPOFOL N/A 05/31/2020   Procedure: COLONOSCOPY WITH PROPOFOL;  Surgeon: Irene Shipper, MD;  Location: [facility] ENDOSCOPY;  Service: Endoscopy;  Laterality: N/A;   COLONOSCOPY WITH PROPOFOL N/A 10/26/2021   Procedure: COLONOSCOPY WITH PROPOFOL;  Surgeon: Sharyn Creamer, MD;  Location: [place];  Service: Gastroenterology;  Laterality: N/A;   DILATION AND CURETTAGE OF UTERUS     ENTEROSCOPY N/A 03/02/2020   Procedure: ENTEROSCOPY;  Surgeon: Yetta Flock, MD;  Location: [facility] ENDOSCOPY;  Service: Gastroenterology;  Laterality: N/A;   EP IMPLANTABLE DEVICE N/A 10/06/2015   Procedure: BIV ICD Generator Changeout;  Surgeon: [name], MD;  Location: [place] [facility];  Service: Cardiovascular;  Laterality: N/A;   ESOPHAGOGASTRODUODENOSCOPY N/A 10/26/2017   Procedure:  ESOPHAGOGASTRODUODENOSCOPY (EGD);  Surgeon: Ladene Artist, MD;  Location[facility];  Service: Endoscopy;  Laterality: N/A;   ESOPHAGOGASTRODUODENOSCOPY (EGD) WITH PROPOFOL N/A 11/07/2017   Procedure: ESOPHAGOGASTRODUODENOSCOPY (EGD) WITH PROPOFOL;  Surgeon: Milus Banister, MD;  [facility];  Service: Endoscopy;  Laterality: N/A;   ESOPHAGOGASTRODUODENOSCOPY (EGD) WITH PROPOFOL N/A 05/29/2020   Procedure: ESOPHAGOGASTRODUODENOSCOPY (EGD) WITH PROPOFOL;  Surgeon: Doran Stabler, MD;  Location: [place];  Service: Gastroenterology;  Laterality: N/A;   ESOPHAGOGASTRODUODENOSCOPY (EGD) WITH PROPOFOL N/A 10/26/2021   Procedure: ESOPHAGOGASTRODUODENOSCOPY (EGD) WITH PROPOFOL;  Surgeon: Sharyn Creamer, MD;  Location: [place];  Service: Gastroenterology;  Laterality:  N/A;   ESOPHAGOGASTRODUODENOSCOPY (EGD) WITH PROPOFOL N/A 03/08/2022   Procedure: ESOPHAGOGASTRODUODENOSCOPY (EGD) WITH PROPOFOL;  Surgeon: Jackquline Denmark, MD;  Location: [place];  Service: Gastroenterology;  Laterality: N/A;   GIVENS CAPSULE STUDY N/A 05/19/2020   Procedure: GIVENS CAPSULE STUDY;  Surgeon: [name], MD;  Location: [place];  Service: Endoscopy;  Laterality: N/A;  .adm for obs since pacemaker, PA wil enter order and see pt   GIVENS CAPSULE STUDY N/A 05/29/2020   Procedure: GIVENS CAPSULE STUDY;  Surgeon: Doran Stabler, MD;  Location: [place];  Service: Gastroenterology;  Laterality: N/A;   HEMATOMA EVACUATION Left 09/24/2020   Procedure: EVACUATION HEMATOMA ARM;  Surgeon: Rosetta Posner, MD;  Location: [facility] OR;  Service: Vascular;  Laterality: Left;   HOT HEMOSTASIS N/A 10/26/2017   Procedure: HOT HEMOSTASIS (ARGON PLASMA COAGULATION/BICAP);  Surgeon: Ladene Artist, MD;  Location: [facility];  Service: Endoscopy;  Laterality: N/A;   HOT HEMOSTASIS N/A 03/02/2020   Procedure: HOT HEMOSTASIS (ARGON PLASMA COAGULATION/BICAP);  Surgeon: Yetta Flock, MD;  Location:  [facility] ENDOSCOPY;  Service: Gastroenterology;  Laterality: N/A;   HOT HEMOSTASIS N/A 05/31/2020   Procedure: HOT HEMOSTASIS (ARGON PLASMA COAGULATION/BICAP);  Surgeon: Irene Shipper, MD;  Location: [facility] ENDOSCOPY;  Service: Endoscopy;  Laterality: N/A;   HOT HEMOSTASIS N/A 03/08/2022   Procedure: HOT HEMOSTASIS (ARGON PLASMA COAGULATION/BICAP);  Surgeon: Jackquline Denmark, MD;  Location: [facility] ENDOSCOPY;  Service: Gastroenterology;  Laterality: N/A;   INSERT / REPLACE / REMOVE PACEMAKER  ?2008   IR PERC TUN PERIT CATH WO PORT S&I /IMAG  07/05/2020   PERIPHERAL VASCULAR INTERVENTION Left 03/28/2021   Procedure: PERIPHERAL VASCULAR INTERVENTION;  Surgeon: Waynetta Sandy, MD;  Location: [place] [facility];  Service: Cardiovascular;  Laterality: Left;   POLYPECTOMY  11/08/2017   Procedure: POLYPECTOMY;  Surgeon: Milus Banister, MD;  [facility];  Service: Endoscopy;;   PROCTOSCOPY N/A 05/22/2018   Procedure: RIGID PROCTOSCOPY;  Surgeon: Michael Boston, MD;  [facility];  Service: General;  Laterality: N/A;   RIGHT HEART CATH N/A 02/13/2018   Procedure: RIGHT HEART CATH;  Surgeon: [name], MD;  Location: [place] [facility];  Service: Cardiovascular;  Laterality: N/A;   RIGHT HEART CATH N/A 06/23/2020   Procedure: RIGHT HEART CATH;  Surgeon: [name], MD;  Location: [place] [facility];  Service: Cardiovascular;  Laterality: N/A;   RIGHT/LEFT HEART CATH AND CORONARY ANGIOGRAPHY N/A 12/11/2019   Procedure: RIGHT/LEFT HEART CATH AND CORONARY ANGIOGRAPHY;  Surgeon: [name], MD;  Location: [place] [facility];  Service: Cardiovascular;  Laterality: N/A;   SUBMUCOSAL TATTOO INJECTION  03/02/2020   Procedure: SUBMUCOSAL TATTOO INJECTION;  Surgeon: Yetta Flock, MD;  Location: [facility] ENDOSCOPY;  Service: Gastroenterology;;   SUBMUCOSAL TATTOO INJECTION  05/31/2020   Procedure: SUBMUCOSAL TATTOO INJECTION;  Surgeon: Irene Shipper, MD;  Location: [place];   Service: Endoscopy;;   TEE WITHOUT CARDIOVERSION N/A 08/02/2021   Procedure: TRANSESOPHAGEAL ECHOCARDIOGRAM (TEE);  Surgeon: [name], MD;  Location: [facility] ENDOSCOPY;  Service: Cardiovascular;  Laterality: N/A;   TUBAL LIGATION     XI ROBOTIC ASSISTED LOWER ANTERIOR RESECTION N/A 05/22/2018   Procedure: XI ROBOTIC ASSISTED SIGMOID COLOECTOMY MOBILIZATION OF SPLENIC FLEXURE, FIREFLY ASSESSMENT OF PERFUSION;  Surgeon: Michael Boston, MD;  [facility];  Service: General;  Laterality: N/A;  ERAS PATHWAY    Family History  Problem Relation Age of Onset   Asthma Father  Heart attack Father    Asthma Sister    Lung cancer Sister    Heart attack Mother    Stroke Brother    Colon cancer Neg Hx    Esophageal cancer Neg Hx    Pancreatic cancer Neg Hx    Stomach cancer Neg Hx    Liver disease Neg Hx     No Known Allergies  Current Outpatient Medications on File Prior to Visit  Medication Sig Dispense Refill   amiodarone (PACERONE) 200 MG tablet Take 1 tablet (200 mg total) by mouth daily. 90 tablet 3   apixaban (ELIQUIS) 2.5 MG TABS tablet Take 1 tablet (2.5 mg total) by mouth 2 (two) times daily. 60 tablet 11   cyclobenzaprine (FLEXERIL) 5 MG tablet Take 5 mg by mouth at bedtime as needed for muscle spasms.     diazepam (VALIUM) 5 MG tablet 1 tab po bid prn anxiety (Patient taking differently: Take 5 mg by mouth 2 (two) times daily as needed for anxiety.) 10 tablet 0   gabapentin (NEURONTIN) 300 MG capsule Take 1 capsule (300 mg total) by mouth daily. Fill one rx. Computer may have sent over 2 (Patient taking differently: Take 300 mg by mouth daily as needed (pain).) 90 capsule 2   hydrOXYzine (ATARAX) 10 MG tablet 1 tab po q hs prn itching (Patient taking differently: Take 10 mg by mouth at bedtime as needed for itching.) 10 tablet 0   levothyroxine (SYNTHROID) 125 MCG tablet Take 1 tablet (125 mcg total) by mouth daily before breakfast. 30 tablet 11   lidocaine-prilocaine (EMLA)  cream Apply 1 Application topically 3 (three) times a week.     nitroGLYCERIN (NITROSTAT) 0.4 MG SL tablet Place 1 tablet (0.4 mg total) under the tongue every 5 (five) minutes as needed for chest pain. 25 tablet 0   pantoprazole (PROTONIX) 40 MG tablet Take 1 tablet (40 mg total) by mouth 2 (two) times daily before a meal. 30 tablet 2   rOPINIRole (REQUIP) 0.25 MG tablet Take 0.25 mg by mouth at bedtime.     traZODone (DESYREL) 50 MG tablet Take 0.5-1 tablets (25-50 mg total) by mouth at bedtime as needed for sleep. 30 tablet 3   XIIDRA 5 % SOLN Place 1 drop into both eyes at bedtime.     No current facility-administered medications on file prior to visit.    BP (!) 140/56   Pulse 86   Temp 98.4 F (36.9 C)   Resp 18   Ht '4\' 11"'$  (1.499 m)   Wt 125 lb (56.7 kg)   SpO2 100%   BMI 25.25 kg/m        Objective:   Physical Exam  General Mental Status- Alert. General Appearance- Not in acute distress.   Skin General: Color- Normal Color. Moisture- Normal Moisture.  Neck Carotid Arteries- Normal color. Moisture- Normal Moisture. No carotid bruits. No JVD.  Chest and Lung Exam Auscultation: Breath Sounds:-Normal.  Cardiovascular Auscultation:Rythm- Regular. Murmurs & Other Heart Sounds:Auscultation of the heart reveals- No Murmurs.  Abdomen Inspection:-Inspeection Normal. Palpation/Percussion:Note:No mass. Palpation and Percussion of the abdomen reveal- Non Tender, Non Distended + BS, no rebound or guarding.   Neurologic Cranial Nerve exam:- CN III-XII intact(No nystagmus), symmetric smile. Strength:- 5/5 equal and symmetric strength both upper and lower extremities.   Lower ext- calf symmetric and no swelling. Negative homans signs.    Assessment & Plan:   Patient Instructions  Recent intermittent productive cough since the weekend with some reported wheezing.  ESRD and some mild cardiomegaly with small effusion on prior cxr.  Covid and flu test done today in light  of your medical history. Both test negative.  Also will get cbc, cmp and bnp.  Rx doxycyline antibiotic and benzonatate for cough. Albuterol for wheezing  Do recommend you coordinate getting your dialysis done with nephrologist.  Follow up 5-7 days or sooner if needed.      Mackie Pai, PA-C

## 2022-03-20 NOTE — Patient Instructions (Addendum)
Recent intermittent productive cough since the weekend with some reported wheezing. ESRD and some mild cardiomegaly with small effusion on prior cxr.  Covid and flu test done today in light of your medical history. Both test negative.  Also will get cbc, cmp and bnp.  Rx doxycyline antibiotic and benzonatate for cough.   Recent wheezing and considered rx albuterol and then symbicort. On amiorodine and computer states qt prolongation. So will just rx 3 days very low dose medrol.  Do recommend you coordinate getting your dialysis done with nephrologist.  Follow up 5-7 days or sooner if needed.

## 2022-03-21 ENCOUNTER — Telehealth: Payer: Self-pay | Admitting: *Deleted

## 2022-03-21 LAB — COMPREHENSIVE METABOLIC PANEL
ALT: 8 U/L (ref 0–35)
AST: 18 U/L (ref 0–37)
Albumin: 4.1 g/dL (ref 3.5–5.2)
Alkaline Phosphatase: 78 U/L (ref 39–117)
BUN: 29 mg/dL — ABNORMAL HIGH (ref 6–23)
CO2: 33 mEq/L — ABNORMAL HIGH (ref 19–32)
Calcium: 8.1 mg/dL — ABNORMAL LOW (ref 8.4–10.5)
Chloride: 91 mEq/L — ABNORMAL LOW (ref 96–112)
Creatinine, Ser: 7.2 mg/dL (ref 0.40–1.20)
GFR: 5.21 mL/min — CL (ref >60.00)
Glucose, Bld: 92 mg/dL (ref 70–99)
Potassium: 4.4 mEq/L (ref 3.5–5.1)
Sodium: 133 mEq/L — ABNORMAL LOW (ref 135–145)
Total Bilirubin: 0.6 mg/dL (ref 0.2–1.2)
Total Protein: 7.6 g/dL (ref 6.0–8.3)

## 2022-03-21 NOTE — Telephone Encounter (Addendum)
CRITICAL VALUE STICKER  CRITICAL VALUE: Creatinine 7.20 and GFR 5.21  MESSENGER (representative from lab): [place]  MD NOTIFIED: [place]    Pt is on dialysis.  Mackie Pai, PA-C

## 2022-03-24 ENCOUNTER — Other Ambulatory Visit (HOSPITAL_BASED_OUTPATIENT_CLINIC_OR_DEPARTMENT_OTHER): Payer: Self-pay

## 2022-03-24 ENCOUNTER — Ambulatory Visit (INDEPENDENT_AMBULATORY_CARE_PROVIDER_SITE_OTHER): Payer: Medicare Other | Admitting: Medical

## 2022-03-24 ENCOUNTER — Ambulatory Visit (HOSPITAL_BASED_OUTPATIENT_CLINIC_OR_DEPARTMENT_OTHER)
Admission: RE | Admit: 2022-03-24 | Discharge: 2022-03-24 | Disposition: A | Discharge status: Home / Self Care | Payer: Medicare Other | Source: Ambulatory Visit | Attending: Medical | Admitting: Medical

## 2022-03-24 ENCOUNTER — Encounter: Payer: Self-pay | Admitting: Oncology

## 2022-03-24 VITALS — BP 132/60 | HR 76 | Temp 98.2°F | Resp 20 | Ht 59.0 in | Wt 125.0 lb

## 2022-03-24 DIAGNOSIS — R059 Cough, unspecified: Secondary | ICD-10-CM | POA: Insufficient documentation

## 2022-03-24 DIAGNOSIS — J40 Bronchitis, not specified as acute or chronic: Secondary | ICD-10-CM | POA: Insufficient documentation

## 2022-03-24 DIAGNOSIS — I517 Cardiomegaly: Secondary | ICD-10-CM | POA: Insufficient documentation

## 2022-03-24 LAB — POCT INFLUENZA A/B

## 2022-03-24 MED ORDER — HYDROCODONE BIT-HOMATROP MBR 5-1.5 MG/5ML PO SOLN
5.0000 mL | Freq: Three times a day (TID) | ORAL | 0 refills | Status: DC | PRN
  Filled 2022-03-24: qty 70, 5d supply, fill #0

## 2022-03-24 MED ORDER — ATROVENT HFA 17 MCG/ACT IN AERS
2.0000 | INHALATION_SPRAY | Freq: Four times a day (QID) | RESPIRATORY_TRACT | 1 refills | Status: DC | PRN
  Filled 2022-03-24: qty 12.9, 30d supply, fill #0

## 2022-03-24 NOTE — Progress Notes (Addendum)
Subjective:    Patient ID: Morgan Clay, female    DOB: 03-29-1946, 76 y.o.   MRN: [id number]  HPI  Pt in for follow up.  Pt did take antibiotic. She continue to cough.  Pt is in middle of doxycycline course.   Has severe dry cough during entire interivew. States not productive. See cxr report.  Tuesday got bodyaches.  Brief course of medrol seems to not have impacted cough.  Review of Systems  Constitutional:  Negative for chills, fatigue and fever.  HENT:  Positive for congestion. Negative for ear pain.   Respiratory:  Positive for cough. Negative for chest tightness, shortness of breath and wheezing.   Cardiovascular:  Negative for chest pain and palpitations.  Gastrointestinal:  Negative for abdominal pain and anal bleeding.  Musculoskeletal:  Negative for back pain and joint swelling.  Hematological:  Negative for adenopathy. Does not bruise/bleed easily.  Psychiatric/Behavioral:  Negative for behavioral problems and confusion.     Past Medical History:  Diagnosis Date   Acute blood loss anemia 09/24/2020   AICD (automatic cardioverter/defibrillator) present 10/06/2015   Anemia due to chronic blood loss 04/15/2020   Anemia in chronic kidney disease 01/18/2018   Angiodysplasia of small intestine 06/10/2020   Ileum - seen on capsule endoscopy 05/2020 - ablated at colonoscopy   Aortic atherosclerosis ([place]) 08/30/2017   Arthritis    "qwhere" (01/03/2018)   Asthma    reports mild asthma since childhood - had COPD on dx list from prior PCP   Atrial fibrillation with RVR ([place]) 11/17/2020   Atrial fibrillation, chronic 10/25/2017   Atypical atrial flutter ([place]) 09/28/2015   AVM (arteriovenous malformation) of small bowel, acquired 01/14/2018   Biventricular ICD (implantable cardioverter-defibrillator) in place 01/08/2007   Qualifier: Diagnosis of  By: Hassell Done FNP, Nykedtra     Bleeding pseudoaneurysm of left brachiocephalic arteriovenous fistula ([place]) 09/24/2020    Bleeding pseudoaneurysm of left brachiocephalic AV fistula ([place]) 09/24/2020   Carpal tunnel syndrome, bilateral 06/09/2021   Chronic bronchitis ([place])    "get it most years; not this past year though" (01/03/2018)   Chronic diastolic CHF (congestive heart failure) ([place]) 04/24/2007   Annotation: secondary to nonischemic cardiomyopathy s/p CRT-D Followed by Dr. 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 (HCV) ([place])    HEPATITIS C - s/p treatment with Harvoni, saw hepatology, Dawn Drazek   COVID-19 virus infection 05/27/2020   Diabetes mellitus without complication ([place])    DIET CONTROLLED    Dialysis complication    Diverticulitis of left colon status post robotic low anterior to sigmoid resection 05/22/2018 07/31/2017   End stage renal disease ([place]) 03/22/2021   FIBROIDS, UTERUS 03/05/2008   Glaucoma    Gout    History of blood transfusion ~ 11/2017   Hypertension associated with diabetes ([place]) 06/07/2017   Hypothyroidism    LBBB (left bundle branch block)    Lesion of ulnar nerve 06/09/2021   Long term current use of anticoagulant    Lower GI bleeding    "been dealing w/it since 07/2017" (01/03/2018)   Malnutrition of moderate degree 07/09/2020   MDD (major depressive disorder), recurrent, in partial remission ([place]) 06/07/2017   Nonischemic cardiomyopathy ([place])    Obesity 05/27/2009   Qualifier:  Diagnosis of  By: Hassell Done FNP, Nykedtra     OBSTRUCTIVE SLEEP APNEA 11/14/2007   no CPAP   OSTEOPENIA 09/30/2008   Other cirrhosis of liver ([place]) 08/12/2020   Formatting of this note is different from the original. Patient with history of cirrhosis secondary to hepatitis C with no history of hepatic decompensation.  She has had some hyponatremia and  hypoalbuminemia in the past however most recent labs show relatively well-preserved liver synthetic function.  She will have labs to update liver synthetic function.  Hepatoma screening - patient is currentl   Overweight (BMI 25.0-29.9) 03/02/2020   Pain in right knee 05/13/2018   Persistent atrial fibrillation ([place]) 02/06/2020   Pneumonia    "several times" (01/03/2018)   Polyneuropathy associated with underlying disease ([place]) 06/09/2021   Pulmonary hypertension ([place]) on echocardiogram 01/14/2018   Secondary esophageal varices without bleeding ([place]) 12/23/2020   Formatting of this note might be different from the original. Patient had trace esophageal varices during small bowel enteroscopy in October 2021, follow-up EGD in January 2022 with no varices.  She previously had GI bleeding thought to be from AVM versus GAVE.  She is no longer on carvedilol and will need surveillance endoscopy.  Patient was previously followed at [facility] and I have asked her    Secondary hypercoagulable state ([place]) 02/06/2020   Type 2 diabetes, controlled, with renal manifestation ([place]) 11/24/2013     Social History   Socioeconomic History   Marital status: Divorced    Spouse name: Not on file   Number of children: 1   Years of education: 12   Highest education level: Not on file  Occupational History   Not on file  Tobacco Use   Smoking status: Never   Smokeless tobacco: Never  Vaping Use   Vaping Use: Never used  Substance and Sexual Activity   Alcohol use: Not Currently    Comment: 01/03/2018 "couple drinks/month"   Drug use: Not Currently    Types: Marijuana    Comment: VERY INTERMITTENT    Sexual activity: Not Currently  Other Topics Concern   Not on file  Social History Narrative   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      Home Situation: lives alone      Spiritual Beliefs: Christian - [employer]      Lifestyle: no regular exercise; poor diet      She is divorced at least one adult  child   Never smoker    rare alcohol to occasional at best    no substance abuse         Social Determinants of Health   Financial Resource Strain: Low Risk  (07/15/2021)   Overall Financial Resource Strain (CARDIA)    Difficulty of Paying Living Expenses: Not hard at all  Food Insecurity: No Food Insecurity (07/15/2021)   Hunger Vital Sign    Worried About Running Out of Food in the Last Year: Never true    Ran Out of Food in the Last Year: Never true  Transportation Needs: No Transportation Needs (07/15/2021)   PRAPARE - Hydrologist (Medical): No    Lack of Transportation (Non-Medical): No  Physical Activity: Not on file  Stress: No Stress Concern Present (07/15/2021)   [place]    Feeling of Stress : Not at all  Social Connections: Moderately Isolated (07/15/2021)   Social Connection and Isolation Panel [NHANES]    Frequency  of Communication with Friends and Family: More than three times a week    Frequency of Social Gatherings with Friends and Family: Three times a week    Attends Religious Services: More than 4 times per year    Active Member of Clubs or Organizations: No    Attends Archivist Meetings: Never    Marital Status: Divorced  Human resources officer Violence: Not At Risk (07/15/2021)   Humiliation, Afraid, Rape, and Kick questionnaire    Fear of Current or Ex-Partner: No    Emotionally Abused: No    Physically Abused: No    Sexually Abused: No    Past Surgical History:  Procedure Laterality Date   A/V FISTULAGRAM N/A 03/28/2021   Procedure: A/V FISTULAGRAM;  Surgeon: Waynetta Sandy, MD;  Location: [facility];  Service: Cardiovascular;  Laterality: N/A;   APPENDECTOMY     AV FISTULA PLACEMENT Left 08/05/2020   Procedure: LEFT UPPER EXTREMITY ARTERIOVENOUS (AV) FISTULA CREATION;  Surgeon: Cherre Robins, MD;  Location: [facility];  Service:  Vascular;  Laterality: Left;   Flasher Left 09/16/2020   Procedure: LEFT SECOND STAGE [place];  Surgeon: Cherre Robins, MD;  Location: [place];  Service: Vascular;  Laterality: Left;  PERIPHERAL NERVE BLOCK   CARDIAC CATHETERIZATION     CARDIOVERSION N/A 12/14/2014   Procedure: CARDIOVERSION;  Surgeon: Dorothy Spark, MD;  Location: [facility];  Service: Cardiovascular;  Laterality: N/A;   CARDIOVERSION N/A 02/26/2017   Procedure: CARDIOVERSION;  Surgeon: Evans Lance, MD;  Location: [facility];  Service: Cardiovascular;  Laterality: N/A;   CARDIOVERSION N/A 07/24/2017   Procedure: CARDIOVERSION;  Surgeon: Acie Fredrickson Wonda Cheng, MD;  Location: [facility] ENDOSCOPY;  Service: Cardiovascular;  Laterality: N/A;   CARDIOVERSION N/A 02/12/2020   Procedure: CARDIOVERSION;  Surgeon: [name], MD;  Location: [facility] ENDOSCOPY;  Service: Cardiovascular;  Laterality: N/A;   CARDIOVERSION N/A 09/15/2021   Procedure: CARDIOVERSION;  Surgeon: [name], MD;  Location: [facility] ENDOSCOPY;  Service: Cardiovascular;  Laterality: N/A;   CARPAL TUNNEL RELEASE  07/21/2021   COLONOSCOPY N/A 11/08/2017   Procedure: COLONOSCOPY;  Surgeon: Milus Banister, MD;  Location: [facility];  Service: Endoscopy;  Laterality: N/A;   COLONOSCOPY WITH PROPOFOL N/A 05/31/2020   Procedure: COLONOSCOPY WITH PROPOFOL;  Surgeon: Irene Shipper, MD;  Location[facility] ENDOSCOPY;  Service: Endoscopy;  Laterality: N/A;   COLONOSCOPY WITH PROPOFOL N/A 10/26/2021   Procedure: COLONOSCOPY WITH PROPOFOL;  Surgeon: Sharyn Creamer, MD;  Location: [facility];  Service: Gastroenterology;  Laterality: N/A;   DILATION AND CURETTAGE OF UTERUS     ENTEROSCOPY N/A 03/02/2020   Procedure: ENTEROSCOPY;  Surgeon: Yetta Flock, MD;  Location: [facility] ENDOSCOPY;  Service: Gastroenterology;  Laterality: N/A;   EP IMPLANTABLE DEVICE N/A 10/06/2015   Procedure: BIV ICD Generator Changeout;  Surgeon: [name], MD;  Location: [facility] [facility];  Service: Cardiovascular;  Laterality: N/A;   ESOPHAGOGASTRODUODENOSCOPY N/A 10/26/2017   Procedure: ESOPHAGOGASTRODUODENOSCOPY (EGD);  Surgeon: Ladene Artist, MD;  Location[facility];  Service: Endoscopy;  Laterality: N/A;   ESOPHAGOGASTRODUODENOSCOPY (EGD) WITH PROPOFOL N/A 11/07/2017   Procedure: ESOPHAGOGASTRODUODENOSCOPY (EGD) WITH PROPOFOL;  Surgeon: Milus Banister, MD;  [facility];  Service: Endoscopy;  Laterality: N/A;   ESOPHAGOGASTRODUODENOSCOPY (EGD) WITH PROPOFOL N/A 05/29/2020   Procedure: ESOPHAGOGASTRODUODENOSCOPY (EGD) WITH PROPOFOL;  Surgeon: Doran Stabler, MD;  Location: [place];  Service: Gastroenterology;  Laterality: N/A;  ESOPHAGOGASTRODUODENOSCOPY (EGD) WITH PROPOFOL N/A 10/26/2021   Procedure: ESOPHAGOGASTRODUODENOSCOPY (EGD) WITH PROPOFOL;  Surgeon: Sharyn Creamer, MD;  Location: [facility];  Service: Gastroenterology;  Laterality: N/A;   ESOPHAGOGASTRODUODENOSCOPY (EGD) WITH PROPOFOL N/A 03/08/2022   Procedure: ESOPHAGOGASTRODUODENOSCOPY (EGD) WITH PROPOFOL;  Surgeon: Jackquline Denmark, MD;  Location: [place];  Service: Gastroenterology;  Laterality: N/A;   GIVENS CAPSULE STUDY N/A 05/19/2020   Procedure: GIVENS CAPSULE STUDY;  Surgeon: [name], MD;  Location: [facility];  Service: Endoscopy;  Laterality: N/A;  .adm for obs since pacemaker, PA wil enter order and see pt   GIVENS CAPSULE STUDY N/A 05/29/2020   Procedure: GIVENS CAPSULE STUDY;  Surgeon: Doran Stabler, MD;  Location: [place];  Service: Gastroenterology;  Laterality: N/A;   HEMATOMA EVACUATION Left 09/24/2020   Procedure: EVACUATION HEMATOMA ARM;  Surgeon: Rosetta Posner, MD;  Location: [facility] OR;  Service: Vascular;  Laterality: Left;   HOT HEMOSTASIS N/A 10/26/2017   Procedure: HOT HEMOSTASIS (ARGON PLASMA COAGULATION/BICAP);  Surgeon: Ladene Artist, MD;  Location: [facility];  Service: Endoscopy;  Laterality:  N/A;   HOT HEMOSTASIS N/A 03/02/2020   Procedure: HOT HEMOSTASIS (ARGON PLASMA COAGULATION/BICAP);  Surgeon: Yetta Flock, MD;  [facility]COPY;  Service: Gastroenterology;  Laterality: N/A;   HOT HEMOSTASIS N/A 05/31/2020   Procedure: HOT HEMOSTASIS (ARGON PLASMA COAGULATION/BICAP);  Surgeon: Irene Shipper, MD;  Location: [facility]COPY;  Service: Endoscopy;  Laterality: N/A;   HOT HEMOSTASIS N/A 03/08/2022   Procedure: HOT HEMOSTASIS (ARGON PLASMA COAGULATION/BICAP);  Surgeon: Jackquline Denmark, MD;  Location: [facility] ENDOSCOPY;  Service: Gastroenterology;  Laterality: N/A;   INSERT / REPLACE / REMOVE PACEMAKER  ?2008   IR PERC TUN PERIT CATH WO PORT S&I /IMAG  07/05/2020   PERIPHERAL VASCULAR INTERVENTION Left 03/28/2021   Procedure: PERIPHERAL VASCULAR INTERVENTION;  Surgeon: Waynetta Sandy, MD;  Location: [place] [facility];  Service: Cardiovascular;  Laterality: Left;   POLYPECTOMY  11/08/2017   Procedure: POLYPECTOMY;  Surgeon: Milus Banister, MD;  [facility];  Service: Endoscopy;;   PROCTOSCOPY N/A 05/22/2018   Procedure: RIGID PROCTOSCOPY;  Surgeon: Michael Boston, MD;  [facility];  Service: General;  Laterality: N/A;   RIGHT HEART CATH N/A 02/13/2018   Procedure: RIGHT HEART CATH;  Surgeon: [name], MD;  Location: [place] [facility];  Service: Cardiovascular;  Laterality: N/A;   RIGHT HEART CATH N/A 06/23/2020   Procedure: RIGHT HEART CATH;  Surgeon: [name], MD;  Location: [place] [facility];  Service: Cardiovascular;  Laterality: N/A;   RIGHT/LEFT HEART CATH AND CORONARY ANGIOGRAPHY N/A 12/11/2019   Procedure: RIGHT/LEFT HEART CATH AND CORONARY ANGIOGRAPHY;  Surgeon: [name], MD;  Location: [place] [facility];  Service: Cardiovascular;  Laterality: N/A;   SUBMUCOSAL TATTOO INJECTION  03/02/2020   Procedure: SUBMUCOSAL TATTOO INJECTION;  Surgeon: Yetta Flock, MD;  Location: [facility] ENDOSCOPY;  Service: Gastroenterology;;    SUBMUCOSAL TATTOO INJECTION  05/31/2020   Procedure: SUBMUCOSAL TATTOO INJECTION;  Surgeon: Irene Shipper, MD;  Location: [place];  Service: Endoscopy;;   TEE WITHOUT CARDIOVERSION N/A 08/02/2021   Procedure: TRANSESOPHAGEAL ECHOCARDIOGRAM (TEE);  Surgeon: [name], MD;  Location: [facility] ENDOSCOPY;  Service: Cardiovascular;  Laterality: N/A;   TUBAL LIGATION     XI ROBOTIC ASSISTED LOWER ANTERIOR RESECTION N/A 05/22/2018   Procedure: XI ROBOTIC ASSISTED SIGMOID COLOECTOMY MOBILIZATION OF SPLENIC FLEXURE, FIREFLY ASSESSMENT OF PERFUSION;  Surgeon: Michael Boston, MD;  Location:  [facility];  Service: General;  Laterality: N/A;  ERAS PATHWAY    Family History  Problem Relation Age of Onset   Asthma Father    Heart attack Father    Asthma Sister    Lung cancer Sister    Heart attack Mother    Stroke Brother    Colon cancer Neg Hx    Esophageal cancer Neg Hx    Pancreatic cancer Neg Hx    Stomach cancer Neg Hx    Liver disease Neg Hx     No Known Allergies  Current Outpatient Medications on File Prior to Visit  Medication Sig Dispense Refill   amiodarone (PACERONE) 200 MG tablet Take 1 tablet (200 mg total) by mouth daily. 90 tablet 3   apixaban (ELIQUIS) 2.5 MG TABS tablet Take 1 tablet (2.5 mg total) by mouth 2 (two) times daily. 60 tablet 11   benzonatate (TESSALON) 100 MG capsule Take 1 capsule (100 mg total) by mouth 3 (three) times daily as needed for cough. 30 capsule 0   cyclobenzaprine (FLEXERIL) 5 MG tablet Take 5 mg by mouth at bedtime as needed for muscle spasms.     diazepam (VALIUM) 5 MG tablet 1 tab po bid prn anxiety (Patient taking differently: Take 5 mg by mouth 2 (two) times daily as needed for anxiety.) 10 tablet 0   doxycycline (VIBRA-TABS) 100 MG tablet Take 1 tablet (100 mg total) by mouth 2 (two) times daily. 20 tablet 0   gabapentin (NEURONTIN) 300 MG capsule Take 1 capsule (300 mg total) by mouth daily. Fill one rx. Computer may have sent over 2 (Patient  taking differently: Take 300 mg by mouth daily as needed (pain).) 90 capsule 2   hydrOXYzine (ATARAX) 10 MG tablet 1 tab po q hs prn itching (Patient taking differently: Take 10 mg by mouth at bedtime as needed for itching.) 10 tablet 0   levothyroxine (SYNTHROID) 125 MCG tablet Take 1 tablet (125 mcg total) by mouth daily before breakfast. 30 tablet 11   lidocaine-prilocaine (EMLA) cream Apply 1 Application topically 3 (three) times a week.     methylPREDNISolone (MEDROL) 4 MG tablet 3 tab po day 1, 2 tab po day 2 and 1 tab po day 3 6 tablet 0   nitroGLYCERIN (NITROSTAT) 0.4 MG SL tablet Place 1 tablet (0.4 mg total) under the tongue every 5 (five) minutes as needed for chest pain. 25 tablet 0   pantoprazole (PROTONIX) 40 MG tablet Take 1 tablet (40 mg total) by mouth 2 (two) times daily before a meal. 30 tablet 2   rOPINIRole (REQUIP) 0.25 MG tablet Take 0.25 mg by mouth at bedtime.     traZODone (DESYREL) 50 MG tablet Take 0.5-1 tablets (25-50 mg total) by mouth at bedtime as needed for sleep. 30 tablet 3   XIIDRA 5 % SOLN Place 1 drop into both eyes at bedtime.     No current facility-administered medications on file prior to visit.    BP 132/60 (BP Location: Right Arm, Patient Position: Sitting, Cuff Size: Normal)   Pulse 76   Temp 98.2 F (36.8 C) (Oral)   Resp 20   Ht '4\' 11"'$  (1.499 m)   Wt 125 lb (56.7 kg)   SpO2 99%   BMI 25.25 kg/m        Objective:   Physical Exam  General- No acute distress. Pleasant patient. Neck- Full range of motion, no jvd Lungs- Clear, even and unlabored. Heart- regular rate and rhythm. Neurologic-  CNII- XII grossly intact.   Lower ext- calf symmetric. Negative homans signs.         Assessment & Plan:   Patient Instructions  Bronchitis type symptoms with recent bnp mild edema vs infiltrate on xray.  O2 sat 99%. Weight stable.  Considered rx of inhaler as approach weeked. Both albutero, advair and symbicort interaction warning with  amiodorone.  Did end of up prescribing atrovent and no qt prolongation in epic and pharmacy confirmed.  With bnp and cxr finding nephrologist needs to be aware to adjust dialysis.  Daughter [phone]. [name].  Hycodan for severe cough. Rx advisement given. Continue doxycycline antibiotic.   Both your flu and covid test were negative  Dscussed with pt and daughter if cxr looks worse than before may advise ED evaluation.  Follow up date to be determined after chest xray resul   Note pt was last pt of day. Our lab was closed so could not get cbc, cmp or bnp which would have gotten if seen earlier.   Mackie Pai, PA-C    Time spent with patient today was 44  minutes which consisted of chart revdiew, discussing diagnosis, work up treatment and documentation.   I took out covid as it was run but not resulted in epic. Kept me from closing chart. Will ask staff to put order in again and result on Monday.

## 2022-03-24 NOTE — Patient Instructions (Addendum)
Bronchitis type symptoms with recent bnp mild edema vs infiltrate on xray.  O2 sat 99%. Weight stable.  Considered rx of inhaler as approach weeked. Both albuterol, advair and symbicort interaction warning with amiodorone.  Did end of up prescribing atrovent and no qt prolongation in epic and pharmacy confirmed.  With bnp and cxr finding nephrologist needs to be aware to adjust dialysis.  Daughter [phone]. [name].  Hycodan for severe cough. Rx advisement given. Continue doxycycline antibiotic.   Both your flu and covid test were negative  Discussed with pt and daughter if cxr looks worse than before may advise ED evaluation.  Follow up date to be determined after chest xray result

## 2022-03-27 ENCOUNTER — Observation Stay (HOSPITAL_BASED_OUTPATIENT_CLINIC_OR_DEPARTMENT_OTHER)
Admission: EM | Admit: 2022-03-27 | Discharge: 2022-03-29 | Disposition: A | Discharge status: Home / Self Care | Payer: Medicare Other | Attending: Internal Medicine | Admitting: Internal Medicine

## 2022-03-27 ENCOUNTER — Emergency Department (HOSPITAL_BASED_OUTPATIENT_CLINIC_OR_DEPARTMENT_OTHER): Payer: Medicare Other

## 2022-03-27 ENCOUNTER — Other Ambulatory Visit: Payer: Self-pay

## 2022-03-27 ENCOUNTER — Ambulatory Visit (INDEPENDENT_AMBULATORY_CARE_PROVIDER_SITE_OTHER): Payer: Medicare Other | Admitting: Family Medicine

## 2022-03-27 ENCOUNTER — Encounter: Payer: Self-pay | Admitting: Family Medicine

## 2022-03-27 ENCOUNTER — Encounter (HOSPITAL_COMMUNITY): Payer: Self-pay

## 2022-03-27 VITALS — BP 148/60 | HR 88 | Temp 98.0°F | Ht 59.0 in | Wt 126.4 lb

## 2022-03-27 DIAGNOSIS — J449 Chronic obstructive pulmonary disease, unspecified: Secondary | ICD-10-CM | POA: Insufficient documentation

## 2022-03-27 DIAGNOSIS — J45909 Unspecified asthma, uncomplicated: Secondary | ICD-10-CM | POA: Insufficient documentation

## 2022-03-27 DIAGNOSIS — Z95810 Presence of automatic (implantable) cardiac defibrillator: Secondary | ICD-10-CM | POA: Diagnosis not present

## 2022-03-27 DIAGNOSIS — Z1152 Encounter for screening for COVID-19: Secondary | ICD-10-CM | POA: Diagnosis not present

## 2022-03-27 DIAGNOSIS — Z79899 Other long term (current) drug therapy: Secondary | ICD-10-CM | POA: Insufficient documentation

## 2022-03-27 DIAGNOSIS — R0602 Shortness of breath: Secondary | ICD-10-CM | POA: Diagnosis present

## 2022-03-27 DIAGNOSIS — Z992 Dependence on renal dialysis: Secondary | ICD-10-CM | POA: Diagnosis not present

## 2022-03-27 DIAGNOSIS — I5042 Chronic combined systolic (congestive) and diastolic (congestive) heart failure: Secondary | ICD-10-CM | POA: Diagnosis not present

## 2022-03-27 DIAGNOSIS — R0600 Dyspnea, unspecified: Secondary | ICD-10-CM | POA: Diagnosis not present

## 2022-03-27 DIAGNOSIS — I4581 Long QT syndrome: Secondary | ICD-10-CM | POA: Diagnosis not present

## 2022-03-27 DIAGNOSIS — R9431 Abnormal electrocardiogram [ECG] [EKG]: Secondary | ICD-10-CM | POA: Diagnosis present

## 2022-03-27 DIAGNOSIS — I4819 Other persistent atrial fibrillation: Secondary | ICD-10-CM | POA: Insufficient documentation

## 2022-03-27 DIAGNOSIS — Z7901 Long term (current) use of anticoagulants: Secondary | ICD-10-CM | POA: Diagnosis not present

## 2022-03-27 DIAGNOSIS — R051 Acute cough: Secondary | ICD-10-CM | POA: Diagnosis not present

## 2022-03-27 DIAGNOSIS — E1122 Type 2 diabetes mellitus with diabetic chronic kidney disease: Secondary | ICD-10-CM | POA: Diagnosis not present

## 2022-03-27 DIAGNOSIS — R059 Cough, unspecified: Secondary | ICD-10-CM | POA: Diagnosis present

## 2022-03-27 DIAGNOSIS — E039 Hypothyroidism, unspecified: Secondary | ICD-10-CM | POA: Insufficient documentation

## 2022-03-27 DIAGNOSIS — N186 End stage renal disease: Secondary | ICD-10-CM | POA: Insufficient documentation

## 2022-03-27 DIAGNOSIS — I5032 Chronic diastolic (congestive) heart failure: Secondary | ICD-10-CM | POA: Diagnosis not present

## 2022-03-27 DIAGNOSIS — H409 Unspecified glaucoma: Secondary | ICD-10-CM | POA: Diagnosis present

## 2022-03-27 DIAGNOSIS — I447 Left bundle-branch block, unspecified: Secondary | ICD-10-CM | POA: Diagnosis present

## 2022-03-27 DIAGNOSIS — R0609 Other forms of dyspnea: Secondary | ICD-10-CM | POA: Diagnosis present

## 2022-03-27 DIAGNOSIS — I132 Hypertensive heart and chronic kidney disease with heart failure and with stage 5 chronic kidney disease, or end stage renal disease: Secondary | ICD-10-CM | POA: Insufficient documentation

## 2022-03-27 LAB — CBC WITH DIFFERENTIAL/PLATELET
Abs Immature Granulocytes: 0.03 10*3/uL (ref 0.00–0.07)
Basophils Absolute: 0 10*3/uL (ref 0.0–0.1)
Basophils Relative: 1 %
Eosinophils Absolute: 0.2 10*3/uL (ref 0.0–0.5)
Eosinophils Relative: 3 %
HCT: 31.7 % — ABNORMAL LOW (ref 36.0–46.0)
Hemoglobin: 9.9 g/dL — ABNORMAL LOW (ref 12.0–15.0)
Immature Granulocytes: 1 %
Lymphocytes Relative: 22 %
Lymphs Abs: 1.3 10*3/uL (ref 0.7–4.0)
MCH: 29.6 pg (ref 26.0–34.0)
MCHC: 31.2 g/dL (ref 30.0–36.0)
MCV: 94.6 fL (ref 80.0–100.0)
Monocytes Absolute: 0.7 10*3/uL (ref 0.1–1.0)
Monocytes Relative: 11 %
Neutro Abs: 3.8 10*3/uL (ref 1.7–7.7)
Neutrophils Relative %: 62 %
Platelets: 217 10*3/uL (ref 150–400)
RBC: 3.35 MIL/uL — ABNORMAL LOW (ref 3.87–5.11)
RDW: 17.6 % — ABNORMAL HIGH (ref 11.5–15.5)
WBC: 5.9 10*3/uL (ref 4.0–10.5)
nRBC: 0 % (ref 0.0–0.2)

## 2022-03-27 LAB — RESP PANEL BY RT-PCR (FLU A&B, COVID) ARPGX2
Influenza A by PCR: NEGATIVE
Influenza B by PCR: NEGATIVE
SARS Coronavirus 2 by RT PCR: NEGATIVE

## 2022-03-27 LAB — COMPREHENSIVE METABOLIC PANEL
ALT: 11 U/L (ref 0–44)
AST: 21 U/L (ref 15–41)
Albumin: 3.8 g/dL (ref 3.5–5.0)
Alkaline Phosphatase: 79 U/L (ref 38–126)
Anion gap: 13 (ref 5–15)
BUN: 14 mg/dL (ref 8–23)
CO2: 31 mmol/L (ref 22–32)
Calcium: 8.2 mg/dL — ABNORMAL LOW (ref 8.9–10.3)
Chloride: 94 mmol/L — ABNORMAL LOW (ref 98–111)
Creatinine, Ser: 4.71 mg/dL — ABNORMAL HIGH (ref 0.44–1.00)
GFR, Estimated: 9 mL/min — ABNORMAL LOW (ref >60)
Glucose, Bld: 97 mg/dL (ref 70–99)
Potassium: 3.4 mmol/L — ABNORMAL LOW (ref 3.5–5.1)
Sodium: 138 mmol/L (ref 135–145)
Total Bilirubin: 0.9 mg/dL (ref 0.3–1.2)
Total Protein: 8.7 g/dL — ABNORMAL HIGH (ref 6.5–8.1)

## 2022-03-27 LAB — TROPONIN I (HIGH SENSITIVITY)
Troponin I (High Sensitivity): 55 ng/L — ABNORMAL HIGH (ref <18)
Troponin I (High Sensitivity): 54 ng/L — ABNORMAL HIGH (ref <18)

## 2022-03-27 LAB — BRAIN NATRIURETIC PEPTIDE: B Natriuretic Peptide: 1685 pg/mL — ABNORMAL HIGH (ref 0.0–100.0)

## 2022-03-27 MED ORDER — METHYLPREDNISOLONE SODIUM SUCC 125 MG IJ SOLR
125.0000 mg | Freq: Once | INTRAMUSCULAR | Status: AC
  Administered 2022-03-27: 125 mg via INTRAVENOUS
  Filled 2022-03-27: qty 2

## 2022-03-27 MED ORDER — IPRATROPIUM-ALBUTEROL 0.5-2.5 (3) MG/3ML IN SOLN
3.0000 mL | RESPIRATORY_TRACT | Status: DC
  Administered 2022-03-27: 3 mL via RESPIRATORY_TRACT
  Filled 2022-03-27 (×3): qty 3

## 2022-03-27 NOTE — Progress Notes (Signed)
Plan of Care Note for accepted transfer   Patient: Morgan Clay MRN: [id number]   [place]: 03/27/2022  Facility requesting transfer: [facility]   Requesting Provider: Steva Colder, PA  Reason for transfer: Dyspnea   Facility course: 76 yr old lady with ESRD and a fib on Eliquis p/w 1-2 weeks of cough and SOB despite outpatient treatment with doxycycline, prednisone, and Atrovent. She is afebrile with stable vitals but said to be severely dyspneic with mild exertion in ED. CXR findings could reflect mild interstitial edema or atypical infection. She was treated with IV Solu-Medrol and Duoneb in ED. She was dialyzed yesterday.   Plan of care: Further workup and treatment of persistent cough and SOB.  The patient is accepted for admission to Telemetry unit, at [facility].   Author: Vianne Bulls, MD 03/27/2022  Check [email or web address] for on-call coverage.  Nursing staff, Please call [place] number on Amion as soon as patient's arrival, so appropriate admitting provider can evaluate the pt.

## 2022-03-27 NOTE — ED Provider Notes (Signed)
[facility] Provider Note   CSN: [id number] Arrival date & time: 03/27/22  1629     History  Chief Complaint  Patient presents with   Shortness of Breath    Morgan Clay is a 76 y.o. female with a PMHx of asthma who presents emergency department with concerns for shortness of breath onset 1 week.  Has associated cough (dry cough), chest tightness (secondary to cough).  Has tried prescription doxycycline, Tessalon Perles, steroids, Hycodan, Atrovent.  Notes that her symptoms improved with Hycodan and Atrovent.  Denies fever, rhinorrhea, nasal congestion, abdominal pain, nausea, vomiting.  Patient does not smoke.  Patient denies history of COPD or CHF.    Per patient chart review: Pt was evaluated by her primary care provider multiple times within the past several weeks secondary to her cough that has been ongoing.  She has been prescribed Atrovent, Hycodan and noted improvement of her symptoms.   The history is provided by the patient. No language interpreter was used.        Home Medications Prior to Admission medications   Medication Sig Start Date End Date Taking? Authorizing Provider  amiodarone (PACERONE) 200 MG tablet Take 1 tablet (200 mg total) by mouth daily. 11/10/21   Saguier, Percell Miller, PA-C  apixaban (ELIQUIS) 2.5 MG TABS tablet Take 1 tablet (2.5 mg total) by mouth 2 (two) times daily. 03/11/22   Pokhrel, Corrie Mckusick, MD  benzonatate (TESSALON) 100 MG capsule Take 1 capsule (100 mg total) by mouth 3 (three) times daily as needed for cough. 03/20/22   Saguier, Percell Miller, PA-C  cyclobenzaprine (FLEXERIL) 5 MG tablet Take 5 mg by mouth at bedtime as needed for muscle spasms. 12/07/21   [provider]  diazepam (VALIUM) 5 MG tablet 1 tab po bid prn anxiety Patient taking differently: Take 5 mg by mouth 2 (two) times daily as needed for anxiety. 01/19/22   Saguier, Percell Miller, PA-C  doxycycline (VIBRA-TABS) 100 MG tablet Take 1 tablet (100 mg  total) by mouth 2 (two) times daily. 03/20/22   Saguier, Percell Miller, PA-C  gabapentin (NEURONTIN) 300 MG capsule Take 1 capsule (300 mg total) by mouth daily. Fill one rx. Computer may have sent over 2 Patient taking differently: Take 300 mg by mouth daily as needed (pain). 01/10/22   Saguier, Percell Miller, PA-C  HYDROcodone bit-homatropine (HYCODAN) 5-1.5 MG/5ML syrup Take 5 mLs by mouth every 8 (eight) hours as needed for cough. 03/24/22   Saguier, Percell Miller, PA-C  hydrOXYzine (ATARAX) 10 MG tablet 1 tab po q hs prn itching Patient taking differently: Take 10 mg by mouth at bedtime as needed for itching. 02/07/22   Saguier, Percell Miller, PA-C  ipratropium (ATROVENT HFA) 17 MCG/ACT inhaler Inhale 2 puffs into the lungs every 6 (six) hours as needed for wheezing. 03/24/22   Saguier, Percell Miller, PA-C  levothyroxine (SYNTHROID) 125 MCG tablet Take 1 tablet (125 mcg total) by mouth daily before breakfast. 01/16/22   Saguier, Percell Miller, PA-C  lidocaine-prilocaine (EMLA) cream Apply 1 Application topically 3 (three) times a week. 09/08/21   [provider]  methylPREDNISolone (MEDROL) 4 MG tablet 3 tab po day 1, 2 tab po day 2 and 1 tab po day 3 03/20/22   Saguier, Percell Miller, PA-C  nitroGLYCERIN (NITROSTAT) 0.4 MG SL tablet Place 1 tablet (0.4 mg total) under the tongue every 5 (five) minutes as needed for chest pain. 10/27/21   Thurnell Lose, MD  pantoprazole (PROTONIX) 40 MG tablet Take 1 tablet (40 mg total) by mouth 2 (  two) times daily before a meal. 03/09/22   Pokhrel, Laxman, MD  rOPINIRole (REQUIP) 0.25 MG tablet Take 0.25 mg by mouth at bedtime. 12/08/21   [provider]  traZODone (DESYREL) 50 MG tablet Take 0.5-1 tablets (25-50 mg total) by mouth at bedtime as needed for sleep. 12/16/20   Saguier, Percell Miller, PA-C  XIIDRA 5 % SOLN Place 1 drop into both eyes at bedtime. 11/23/21   [provider]      Allergies    Patient has no known allergies.    Review of Systems   Review of Systems  Respiratory:   Positive for shortness of breath.   All other systems reviewed and are negative.   Physical Exam Updated Vital Signs BP (!) 155/58 (BP Location: Right Arm)   Pulse 71   Temp 98 F (36.7 C)   Resp 20   Ht '4\' 11"'$  (1.499 m)   Wt 57.2 kg   SpO2 96%   BMI 25.45 kg/m  Physical Exam Vitals and nursing note reviewed.  Constitutional:      General: She is not in acute distress.    Appearance: She is not diaphoretic.  HENT:     Head: Normocephalic and atraumatic.     Mouth/Throat:     Pharynx: No oropharyngeal exudate.  Eyes:     General: No scleral icterus.    Conjunctiva/sclera: Conjunctivae normal.  Cardiovascular:     Rate and Rhythm: Normal rate and regular rhythm.     Pulses: Normal pulses.     Heart sounds: Normal heart sounds.  Pulmonary:     Effort: Pulmonary effort is normal. No respiratory distress.     Breath sounds: Normal breath sounds. No wheezing.  Abdominal:     General: Bowel sounds are normal.     Palpations: Abdomen is soft. There is no mass.     Tenderness: There is no abdominal tenderness. There is no guarding or rebound.  Musculoskeletal:        General: Normal range of motion.     Cervical back: Normal range of motion and neck supple.  Skin:    General: Skin is warm and dry.  Neurological:     Mental Status: She is alert.  Psychiatric:        Behavior: Behavior normal.    ED Results / Procedures / Treatments   Labs (all labs ordered are listed, but only abnormal results are displayed) Labs Reviewed  CBC WITH DIFFERENTIAL/PLATELET - Abnormal; Notable for the following components:      Result Value   RBC 3.35 (*)    Hemoglobin 9.9 (*)    HCT 31.7 (*)    RDW 17.6 (*)    All other components within normal limits  COMPREHENSIVE METABOLIC PANEL - Abnormal; Notable for the following components:   Potassium 3.4 (*)    Chloride 94 (*)    Creatinine, Ser 4.71 (*)    Calcium 8.2 (*)    Total Protein 8.7 (*)    GFR, Estimated 9 (*)    All other  components within normal limits  BRAIN NATRIURETIC PEPTIDE - Abnormal; Notable for the following components:   B Natriuretic Peptide 1,685.0 (*)    All other components within normal limits  TROPONIN I (HIGH SENSITIVITY) - Abnormal; Notable for the following components:   Troponin I (High Sensitivity) 55 (*)    All other components within normal limits  RESP PANEL BY RT-PCR (FLU A&B, COVID) ARPGX2  TROPONIN I (HIGH SENSITIVITY)  EKG EKG Interpretation  Date/Time:  Monday March 27 2022 16:43:02 EST Ventricular Rate:  79 PR Interval:  131 QRS Duration: 141 QT Interval:  485 QTC Calculation: 557 R Axis:   266 Text Interpretation: Atrial-sensed ventricular-paced rhythm No further analysis attempted due to paced rhythm No significant change was found Confirmed by Ezequiel Essex [phone]) on 03/27/2022 5:09:22 PM  Radiology DG Chest 2 View  Result Date: 03/27/2022 CLINICAL DATA:  Shortness of breath and cough for 1 week EXAM: CHEST - 2 VIEW COMPARISON:  03/24/2022 FINDINGS: AICD noted. Stable moderate enlargement of the cardiopericardial silhouette. Atherosclerotic calcification of the aortic arch. Perihilar interstitial accentuation noted without definite Kerley B lines, potentially from atypical pneumonia or mild interstitial edema. No pleural effusion. IMPRESSION: 1. Stable moderate enlargement of the cardiopericardial silhouette. 2. Perihilar interstitial accentuation noted without definite Kerley B lines, potentially from atypical pneumonia or mild interstitial edema. 3. AICD noted. Electronically Signed   By: Van Clines M.D.   On: 03/27/2022 17:39    Procedures Procedures    Medications Ordered in ED Medications  ipratropium-albuterol (DUONEB) 0.5-2.5 (3) MG/3ML nebulizer solution 3 mL (3 mLs Nebulization Given 03/27/22 1737)  methylPREDNISolone sodium succinate (SOLU-MEDROL) 125 mg/2 mL injection 125 mg (125 mg Intravenous Given 03/27/22 1751)    ED Course/  Medical Decision Making/ A&P Clinical Course as of 03/27/22 1928  Mon Mar 27, 2022  1823 Notified by respiratory therapist that patient ambulatory with her O2 sats at 96% however patient extremely short of breath with increased work of breathing during ambulation. [SB]  1824 Discussed with patient plans for admission, patient agreeable at this time. [SB]  1917 Consult with Hospitalist, Dr. Myna Hidalgo who agrees with admission to [facility]. [SB]    Clinical Course User Index [SB] Veda Arrellano A, PA-C                           Medical Decision Making Amount and/or Complexity of Data Reviewed Labs: ordered. Radiology: ordered. ECG/medicine tests: ordered.  Risk Prescription drug management. Decision regarding hospitalization.   Patient presents to the ED complaining of shortness of breath onset 1 week.  Vital signs, patient afebrile. Not hypoxic or tachycardic.  No oxygen at baseline.  On exam patient with wheezing noted throughout lung fields. No pitting edema noted on exam.  Recent echocardiogram and March 2023 showed LVEF of 60-65%.  Differential diagnosis includes CHF exacerbation, ACS, pneumothorax, pneumonia, COVID, flu, asthma exacerbation.   Co morbidities that complicate the patient evaluation: Dialysis patient, recent dialysis on yesterday.  Schedule changed to Sunday, Tuesday, Friday due to Thanksgiving holiday CHF   Additional history obtained:  External records from outside source obtained and reviewed including: Pt was evaluated by her primary care provider multiple times within the past several weeks secondary to her cough that has been ongoing.  She has been prescribed Atrovent, Hycodan and noted improvement of her symptoms.   Labs:  I ordered, and personally interpreted labs.  The pertinent results include:   COVID, flu swab negative. Initial troponin at 55, delta troponin ordered with results pending at time of admission. BNP elevated at 1685 CMP with slightly  decreased potassium at 3.4, creatinine at 4.71 CMP with slightly decreased potassium at 3.4, creatinine at 4.71, improved from previous value. CBC without leukocytosis  Imaging: I ordered imaging studies including CXR I independently visualized and interpreted imaging which showed:  1. Stable moderate enlargement of the cardiopericardial silhouette.  2. Perihilar interstitial  accentuation noted without definite Kerley  B lines, potentially from atypical pneumonia or mild interstitial  edema.  3. AICD noted.   I agree with the radiologist interpretation  Medications:  I ordered medication including duoneb, solu-medrol for symptom management Reevaluation of the patient after these medicines and interventions, I reevaluated the patient and found that they have improved I have reviewed the patients home medicines and have made adjustments as needed   Consultations: I requested consultation with the Hospitalist, and discussed lab and imaging findings as well as pertinent plan - they recommend: recommends admission to [facility]  Disposition: Patient presentation suspicious for shortness of breath exacerbation. Question if this due to CHF exacerbation.  Patient is compliant with her dialysis treatment with her most recent treatment being on yesterday.  Doubt pneumothorax.  After consideration of the diagnostic results and the patients response to treatment, I feel that the patient would benefit from Admission to the hospital.  Discussed with patient plans for admission.  Patient agreeable at this time.  Patient appears safe for admission.  This chart was dictated using voice recognition software, Dragon. Despite the best efforts of this provider to proofread and correct errors, errors may still occur which can change documentation meaning.   Final Clinical Impression(s) / ED Diagnoses Final diagnoses:  None    Rx / DC Orders ED Discharge Orders     None         Vonte Rossin A,  PA-C 03/27/22 1940    Ezequiel Essex, MD 03/28/22 1155

## 2022-03-27 NOTE — ED Notes (Signed)
Pt. Reports she started with breathing difficulty several days ago and had dialysis yesterday but still feeling SHOB today.  Pt. Drove self to ED today.

## 2022-03-27 NOTE — Progress Notes (Signed)
   Acute Office Visit  Subjective:     Patient ID: Morgan Clay, female    DOB: 14-Jun-1945, 76 y.o.   MRN: [id number]  Chief Complaint  Patient presents with   Cough    Ongoing. Got worse since Friday     HPI Patient is in today for cough, dyspnea.  Patient has been having trouble with cough/dyspnea for the past week or two. She took doxycycline, Tessalon, and steroids. CXR revealed mild edema vs infiltrate on 03/20/22. Repeat CXR on 03/24/22 with no changes. Atrovent and Hycodan prescribed at last visit.  Today she reports she still has a dry cough keeping her from sleeping. Hycodan and inhaler did help some, but still coughing a lot. Chest is tight/sore from the cough. No recent fevers, chills, body aches, nasal congestion, post nasal drainage, leg swelling. She is not a smoker.  HD yesterday and due for HD tomorrow. Bnp >900 on 11/13   ROS All review of systems negative except what is listed in the HPI      Objective:    BP (!) 148/60   Pulse 88   Temp 98 F (36.7 C) (Oral)   Ht '4\' 11"'$  (1.499 m)   Wt 126 lb 6.4 oz (57.3 kg)   SpO2 99%   BMI 25.53 kg/m    Physical Exam Vitals reviewed.  Constitutional:      Appearance: Normal appearance.  HENT:     Head: Normocephalic and atraumatic.  Cardiovascular:     Rate and Rhythm: Normal rate and regular rhythm.  Pulmonary:     Breath sounds: Wheezing present.     Comments: Tight sounding throughout, dyspnea with light conversation  Musculoskeletal:     Cervical back: Normal range of motion.     Right lower leg: No edema.     Left lower leg: No edema.  Skin:    General: Skin is warm and dry.  Neurological:     Mental Status: She is alert and oriented to person, place, and time.  Psychiatric:        Mood and Affect: Mood normal.        Behavior: Behavior normal.        Thought Content: Thought content normal.        Judgment: Judgment normal.     No results found for any visits on 03/27/22.       Assessment & Plan:   1. Acute cough 2. Shortness of breath Consulted with PCP who saw her the last two visits and he agrees she is worsening. This is her third visit here in the past week for cough/dyspnea. She has not responded to antibiotics, steroids, cough suppressants.  BNP elevated last week.  CKD with HD - last session was yesterday (unsure of how much fluid removed) Patient agreeable to go to the ED to further evaluate due to failed therapy and worsening dyspnea.  VSS. Escorted to ED by CMA. Report called to ED provider.    Return if symptoms worsen or fail to improve, for (f/u withPCP after ED visit).  Terrilyn Saver, NP

## 2022-03-27 NOTE — Patient Instructions (Signed)
Strongly encouraged to go to the ED.

## 2022-03-27 NOTE — ED Notes (Signed)
RT ambulated with pulse ox. Only made it 1/2 way around department before she became SOB. SAT never dropped below 96%, but we returned to room. MD aware.

## 2022-03-27 NOTE — ED Triage Notes (Signed)
Patient presents to ED via POV from home. Here with shortness of breath and cough x 1 week.

## 2022-03-28 ENCOUNTER — Telehealth: Payer: Self-pay | Admitting: Pulmonary Disease

## 2022-03-28 ENCOUNTER — Encounter (HOSPITAL_BASED_OUTPATIENT_CLINIC_OR_DEPARTMENT_OTHER): Payer: Self-pay | Admitting: Family Medicine

## 2022-03-28 DIAGNOSIS — R0602 Shortness of breath: Secondary | ICD-10-CM

## 2022-03-28 DIAGNOSIS — Z1152 Encounter for screening for COVID-19: Secondary | ICD-10-CM | POA: Diagnosis not present

## 2022-03-28 DIAGNOSIS — I132 Hypertensive heart and chronic kidney disease with heart failure and with stage 5 chronic kidney disease, or end stage renal disease: Secondary | ICD-10-CM | POA: Diagnosis not present

## 2022-03-28 DIAGNOSIS — R0600 Dyspnea, unspecified: Secondary | ICD-10-CM | POA: Diagnosis not present

## 2022-03-28 DIAGNOSIS — E039 Hypothyroidism, unspecified: Secondary | ICD-10-CM | POA: Diagnosis not present

## 2022-03-28 DIAGNOSIS — I4581 Long QT syndrome: Secondary | ICD-10-CM | POA: Diagnosis not present

## 2022-03-28 DIAGNOSIS — I4819 Other persistent atrial fibrillation: Secondary | ICD-10-CM | POA: Diagnosis not present

## 2022-03-28 DIAGNOSIS — R053 Chronic cough: Secondary | ICD-10-CM | POA: Diagnosis not present

## 2022-03-28 DIAGNOSIS — Z95810 Presence of automatic (implantable) cardiac defibrillator: Secondary | ICD-10-CM | POA: Diagnosis not present

## 2022-03-28 DIAGNOSIS — I5042 Chronic combined systolic (congestive) and diastolic (congestive) heart failure: Secondary | ICD-10-CM | POA: Diagnosis not present

## 2022-03-28 DIAGNOSIS — Z7901 Long term (current) use of anticoagulants: Secondary | ICD-10-CM | POA: Diagnosis not present

## 2022-03-28 DIAGNOSIS — N186 End stage renal disease: Secondary | ICD-10-CM | POA: Diagnosis not present

## 2022-03-28 DIAGNOSIS — I5032 Chronic diastolic (congestive) heart failure: Secondary | ICD-10-CM | POA: Diagnosis not present

## 2022-03-28 DIAGNOSIS — E1122 Type 2 diabetes mellitus with diabetic chronic kidney disease: Secondary | ICD-10-CM | POA: Diagnosis not present

## 2022-03-28 DIAGNOSIS — J45909 Unspecified asthma, uncomplicated: Secondary | ICD-10-CM | POA: Diagnosis not present

## 2022-03-28 DIAGNOSIS — Z992 Dependence on renal dialysis: Secondary | ICD-10-CM | POA: Diagnosis not present

## 2022-03-28 DIAGNOSIS — Z79899 Other long term (current) drug therapy: Secondary | ICD-10-CM | POA: Diagnosis not present

## 2022-03-28 DIAGNOSIS — J449 Chronic obstructive pulmonary disease, unspecified: Secondary | ICD-10-CM | POA: Diagnosis not present

## 2022-03-28 LAB — HEPATITIS B SURFACE ANTIGEN: Hepatitis B Surface Ag: NONREACTIVE

## 2022-03-28 MED ORDER — GUAIFENESIN-CODEINE 100-10 MG/5ML PO SOLN
5.0000 mL | ORAL | Status: DC | PRN
  Administered 2022-03-29: 5 mL via ORAL
  Filled 2022-03-28: qty 5

## 2022-03-28 MED ORDER — LIFITEGRAST 5 % OP SOLN: 1.0000 [drp] | Freq: Every day | OPHTHALMIC | Status: DC

## 2022-03-28 MED ORDER — HYDROXYZINE HCL 10 MG PO TABS: 10.0000 mg | ORAL_TABLET | Freq: Every evening | ORAL | Status: DC | PRN

## 2022-03-28 MED ORDER — LEVOTHYROXINE SODIUM 25 MCG PO TABS
125.0000 ug | ORAL_TABLET | Freq: Every day | ORAL | Status: DC
  Administered 2022-03-28 – 2022-03-29 (×2): 125 ug via ORAL
  Filled 2022-03-28 (×2): qty 1

## 2022-03-28 MED ORDER — LEVALBUTEROL HCL 0.63 MG/3ML IN NEBU: 0.6300 mg | INHALATION_SOLUTION | Freq: Four times a day (QID) | RESPIRATORY_TRACT | Status: DC | PRN

## 2022-03-28 MED ORDER — NEPRO/CARBSTEADY PO LIQD: 237.0000 mL | Freq: Three times a day (TID) | ORAL | Status: DC | PRN

## 2022-03-28 MED ORDER — IPRATROPIUM BROMIDE HFA 17 MCG/ACT IN AERS: 2.0000 | INHALATION_SPRAY | Freq: Four times a day (QID) | RESPIRATORY_TRACT | Status: DC | PRN

## 2022-03-28 MED ORDER — BUDESONIDE 0.5 MG/2ML IN SUSP
0.5000 mg | Freq: Two times a day (BID) | RESPIRATORY_TRACT | Status: DC
  Administered 2022-03-28 – 2022-03-29 (×2): 0.5 mg via RESPIRATORY_TRACT
  Filled 2022-03-28 (×2): qty 2

## 2022-03-28 MED ORDER — DOCUSATE SODIUM 283 MG RE ENEM: 1.0000 | ENEMA | RECTAL | Status: DC | PRN

## 2022-03-28 MED ORDER — TRAZODONE HCL 50 MG PO TABS: 25.0000 mg | ORAL_TABLET | Freq: Every evening | ORAL | Status: DC | PRN

## 2022-03-28 MED ORDER — IPRATROPIUM-ALBUTEROL 0.5-2.5 (3) MG/3ML IN SOLN: 3.0000 mL | Freq: Four times a day (QID) | RESPIRATORY_TRACT | Status: DC

## 2022-03-28 MED ORDER — SODIUM CHLORIDE 0.9% FLUSH
3.0000 mL | Freq: Two times a day (BID) | INTRAVENOUS | Status: DC
  Administered 2022-03-28 – 2022-03-29 (×2): 3 mL via INTRAVENOUS

## 2022-03-28 MED ORDER — PANTOPRAZOLE SODIUM 40 MG PO TBEC
40.0000 mg | DELAYED_RELEASE_TABLET | Freq: Two times a day (BID) | ORAL | Status: DC
  Administered 2022-03-28 – 2022-03-29 (×3): 40 mg via ORAL
  Filled 2022-03-28 (×4): qty 1

## 2022-03-28 MED ORDER — APIXABAN 2.5 MG PO TABS
2.5000 mg | ORAL_TABLET | Freq: Two times a day (BID) | ORAL | Status: DC
  Administered 2022-03-28 – 2022-03-29 (×3): 2.5 mg via ORAL
  Filled 2022-03-28 (×3): qty 1

## 2022-03-28 MED ORDER — HYDRALAZINE HCL 20 MG/ML IJ SOLN: 5.0000 mg | INTRAMUSCULAR | Status: DC | PRN

## 2022-03-28 MED ORDER — ARFORMOTEROL TARTRATE 15 MCG/2ML IN NEBU
15.0000 ug | INHALATION_SOLUTION | Freq: Two times a day (BID) | RESPIRATORY_TRACT | Status: DC
  Administered 2022-03-28 – 2022-03-29 (×2): 15 ug via RESPIRATORY_TRACT
  Filled 2022-03-28 (×2): qty 2

## 2022-03-28 MED ORDER — ACETAMINOPHEN 325 MG PO TABS: 650.0000 mg | ORAL_TABLET | Freq: Four times a day (QID) | ORAL | Status: DC | PRN

## 2022-03-28 MED ORDER — FAMOTIDINE 20 MG PO TABS
20.0000 mg | ORAL_TABLET | Freq: Two times a day (BID) | ORAL | Status: DC
  Administered 2022-03-29: 20 mg via ORAL
  Filled 2022-03-28 (×2): qty 1

## 2022-03-28 MED ORDER — SORBITOL 70 % SOLN: 30.0000 mL | Status: DC | PRN

## 2022-03-28 MED ORDER — LEVALBUTEROL HCL 0.63 MG/3ML IN NEBU
0.6300 mg | INHALATION_SOLUTION | Freq: Once | RESPIRATORY_TRACT | Status: AC
  Administered 2022-03-28: 0.63 mg via RESPIRATORY_TRACT
  Filled 2022-03-28: qty 3

## 2022-03-28 MED ORDER — BENZONATATE 100 MG PO CAPS: 100.0000 mg | ORAL_CAPSULE | Freq: Three times a day (TID) | ORAL | Status: DC | PRN

## 2022-03-28 MED ORDER — REVEFENACIN 175 MCG/3ML IN SOLN
175.0000 ug | Freq: Every day | RESPIRATORY_TRACT | Status: DC
  Administered 2022-03-29: 175 ug via RESPIRATORY_TRACT
  Filled 2022-03-28: qty 3

## 2022-03-28 MED ORDER — CALCIUM CARBONATE ANTACID 1250 MG/5ML PO SUSP: 500.0000 mg | Freq: Four times a day (QID) | ORAL | Status: DC | PRN

## 2022-03-28 MED ORDER — DIAZEPAM 5 MG PO TABS: 5.0000 mg | ORAL_TABLET | Freq: Two times a day (BID) | ORAL | Status: DC | PRN

## 2022-03-28 MED ORDER — AMIODARONE HCL 200 MG PO TABS
200.0000 mg | ORAL_TABLET | Freq: Every day | ORAL | Status: DC
  Administered 2022-03-28 – 2022-03-29 (×2): 200 mg via ORAL
  Filled 2022-03-28 (×2): qty 1

## 2022-03-28 MED ORDER — ACETAMINOPHEN 650 MG RE SUPP: 650.0000 mg | Freq: Four times a day (QID) | RECTAL | Status: DC | PRN

## 2022-03-28 NOTE — Consult Note (Signed)
[facility] Renal Consultation Note    Indication for Consultation:  Management of ESRD/hemodialysis, anemia, hypertension/volume, and secondary hyperparathyroidism. PCP:  HPI: Morgan Clay is a 76 y.o. female with ESRD, HFrEF with AICD, CAD, A-fib/flutter, T2DM, GERD, OSA, Hep C cirrhosis, T2DM who was admitted as transfer from [place] with cough/dyspnea.  Pt reports cough/dyspnea off and on since recent admit with GIB (10/30 - 11/2) after having EGD. Initially, she thought it was from the scope, but symptoms persisted. Saw her PCP on 11/13 - Flu/COVID negative. Dx with acute bronchitis - Erx doxycycline, benzononate, albuterol inhaler. CXR 11/13 with ^ pulm edema. Discussed cough with nephrology team on 11/15 at HD - EDW lowered. Back to her PCP on 11/17 with the same - given atrovent and hydrocodone cough syrup, CXR 11/17 same as prior. Continues to have productive cough. No fever, chills. No CP, abdominal pain.   Back to [facility] ED last night for ongoing symptoms. Noted to be hypoxic with walking. CXR ?possible atypical pneumonia. Was admitted and transferred to [facility].   Today - she looks well, but had significant coughing episode during our visit - productive but unclear to bring up any sputum, lasted for a while and she was in mild distress while coughing.  Dialyzes on MWF schedule at [facility] -- this week following Holiday schedule with Sun/Tues/Fri. Uses L AVF without recent complications.  Past Medical History:  Diagnosis Date   Acute blood loss anemia 09/24/2020   AICD (automatic cardioverter/defibrillator) present 10/06/2015   Anemia due to chronic blood loss 04/15/2020   Anemia in chronic kidney disease 01/18/2018   Angiodysplasia of small intestine 06/10/2020   Ileum - seen on capsule endoscopy 05/2020 - ablated at colonoscopy   Aortic atherosclerosis ([place]) 08/30/2017   Arthritis    "qwhere" (01/03/2018)   Asthma    reports mild asthma since childhood -  had COPD on dx list from prior PCP   Atrial fibrillation with RVR ([place]) 11/17/2020   Atrial fibrillation, chronic 10/25/2017   Atypical atrial flutter ([place]) 09/28/2015   AVM (arteriovenous malformation) of small bowel, acquired 01/14/2018   Biventricular ICD (implantable cardioverter-defibrillator) in place 01/08/2007   Qualifier: Diagnosis of  By: Hassell Done FNP, Nykedtra     Bleeding pseudoaneurysm of left brachiocephalic arteriovenous fistula ([place]) 09/24/2020   Bleeding pseudoaneurysm of left brachiocephalic AV fistula ([place]) 09/24/2020   Carpal tunnel syndrome, bilateral 06/09/2021   Chronic bronchitis ([place])    "get it most years; not this past year though" (01/03/2018)   Chronic diastolic CHF (congestive heart failure) ([place]) 04/24/2007   Annotation: secondary to nonischemic cardiomyopathy s/p CRT-D Followed by Dr. Caryl Comes in Cardiology Qualifier: Diagnosis of  By: Hassell Done FNP, Nykedtra     Chronic obstructive pulmonary disease ([place])    Chronic systolic congestive heart failure ([place])    CKD (chronic kidney disease) stage 4, GFR 15-29 ml/min  11/24/2013   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 (HCV) ([place])    HEPATITIS C - s/p treatment with Harvoni, saw hepatology, Dawn Drazek   COVID-19 virus infection 05/27/2020   Diabetes mellitus without complication [facility])    DIET CONTROLLED    Dialysis complication    Diverticulitis of left colon status post robotic low anterior to sigmoid resection 05/22/2018 07/31/2017   End stage renal disease ([place]) 03/22/2021   FIBROIDS, UTERUS 03/05/2008   Glaucoma    Gout    History of blood transfusion ~ 11/2017   Hypertension associated with diabetes ([place]) 06/07/2017  Hypothyroidism    LBBB (left bundle branch block)    Lesion of ulnar nerve 06/09/2021   Long term current use of anticoagulant    Lower GI bleeding    "been dealing w/it since 07/2017" (01/03/2018)   Malnutrition of moderate degree 07/09/2020   MDD (major depressive disorder),  recurrent, in partial remission ([place]) 06/07/2017   Nonischemic cardiomyopathy ([place])    Obesity 05/27/2009   Qualifier: Diagnosis of  By: Hassell Done FNP, Nykedtra     OBSTRUCTIVE SLEEP APNEA 11/14/2007   no CPAP   OSTEOPENIA 09/30/2008   Other cirrhosis of liver ([place]) 08/12/2020   Formatting of this note is different from the original. Patient with history of cirrhosis secondary to hepatitis C with no history of hepatic decompensation.  She has had some hyponatremia and hypoalbuminemia in the past however most recent labs show relatively well-preserved liver synthetic function.  She will have labs to update liver synthetic function.  Hepatoma screening - patient is currentl   Overweight (BMI 25.0-29.9) 03/02/2020   Pain in right knee 05/13/2018   Persistent atrial fibrillation ([place]) 02/06/2020   Pneumonia    "several times" (01/03/2018)   Polyneuropathy associated with underlying disease ([place]) 06/09/2021   Pulmonary hypertension ([place]) on echocardiogram 01/14/2018   Secondary esophageal varices without bleeding ([place]) 12/23/2020   Formatting of this note might be different from the original. Patient had trace esophageal varices during small bowel enteroscopy in October 2021, follow-up EGD in January 2022 with no varices.  She previously had GI bleeding thought to be from AVM versus GAVE.  She is no longer on carvedilol and will need surveillance endoscopy.  Patient was previously followed at [facility] and I have asked her    Secondary hypercoagulable state ([place]) 02/06/2020   Type 2 diabetes, controlled, with renal manifestation ([place]) 11/24/2013   Past Surgical History:  Procedure Laterality Date   A/V FISTULAGRAM N/A 03/28/2021   Procedure: A/V FISTULAGRAM;  Surgeon: Waynetta Sandy, MD;  Location: [place] [facility];  Service: Cardiovascular;  Laterality: N/A;   APPENDECTOMY     AV FISTULA PLACEMENT Left 08/05/2020   Procedure: LEFT UPPER EXTREMITY ARTERIOVENOUS (AV) FISTULA CREATION;   Surgeon: Cherre Robins, MD;  Location: [place];  Service: Vascular;  Laterality: Left;   [place] Left 09/16/2020   Procedure: LEFT SECOND STAGE [place];  Surgeon: Cherre Robins, MD;  Location: [place];  Service: Vascular;  Laterality: Left;  PERIPHERAL NERVE BLOCK   CARDIAC CATHETERIZATION     CARDIOVERSION N/A 12/14/2014   Procedure: CARDIOVERSION;  Surgeon: Dorothy Spark, MD;  Location: [place];  Service: Cardiovascular;  Laterality: N/A;   CARDIOVERSION N/A 02/26/2017   Procedure: CARDIOVERSION;  Surgeon: Evans Lance, MD;  Location: [place] [facility];  Service: Cardiovascular;  Laterality: N/A;   CARDIOVERSION N/A 07/24/2017   Procedure: CARDIOVERSION;  Surgeon: Acie Fredrickson Wonda Cheng, MD;  Location: [facility] ENDOSCOPY;  Service: Cardiovascular;  Laterality: N/A;   CARDIOVERSION N/A 02/12/2020   Procedure: CARDIOVERSION;  Surgeon: [name], MD;  Location: [facility] ENDOSCOPY;  Service: Cardiovascular;  Laterality: N/A;   CARDIOVERSION N/A 09/15/2021   Procedure: CARDIOVERSION;  Surgeon: [name], MD;  Location: [facility] ENDOSCOPY;  Service: Cardiovascular;  Laterality: N/A;   CARPAL TUNNEL RELEASE  07/21/2021   COLONOSCOPY N/A 11/08/2017   Procedure: COLONOSCOPY;  Surgeon: Milus Banister, MD;  Location: [facility] ENDOSCOPY;  Service: Endoscopy;  Laterality: N/A;   COLONOSCOPY WITH PROPOFOL N/A 05/31/2020   Procedure:  COLONOSCOPY WITH PROPOFOL;  Surgeon: Irene Shipper, MD;  Location: [facility] ENDOSCOPY;  Service: Endoscopy;  Laterality: N/A;   COLONOSCOPY WITH PROPOFOL N/A 10/26/2021   Procedure: COLONOSCOPY WITH PROPOFOL;  Surgeon: Sharyn Creamer, MD;  Location: [place];  Service: Gastroenterology;  Laterality: N/A;   DILATION AND CURETTAGE OF UTERUS     ENTEROSCOPY N/A 03/02/2020   Procedure: ENTEROSCOPY;  Surgeon: Yetta Flock, MD;  Location: [facility] ENDOSCOPY;  Service: Gastroenterology;  Laterality: N/A;   EP IMPLANTABLE DEVICE N/A 10/06/2015    Procedure: BIV ICD Generator Changeout;  Surgeon: [name], MD;  Location: [place] [facility];  Service: Cardiovascular;  Laterality: N/A;   ESOPHAGOGASTRODUODENOSCOPY N/A 10/26/2017   Procedure: ESOPHAGOGASTRODUODENOSCOPY (EGD);  Surgeon: Ladene Artist, MD;  [facility];  Service: Endoscopy;  Laterality: N/A;   ESOPHAGOGASTRODUODENOSCOPY (EGD) WITH PROPOFOL N/A 11/07/2017   Procedure: ESOPHAGOGASTRODUODENOSCOPY (EGD) WITH PROPOFOL;  Surgeon: Milus Banister, MD;  [facility];  Service: Endoscopy;  Laterality: N/A;   ESOPHAGOGASTRODUODENOSCOPY (EGD) WITH PROPOFOL N/A 05/29/2020   Procedure: ESOPHAGOGASTRODUODENOSCOPY (EGD) WITH PROPOFOL;  Surgeon: Doran Stabler, MD;  Location: [place];  Service: Gastroenterology;  Laterality: N/A;   ESOPHAGOGASTRODUODENOSCOPY (EGD) WITH PROPOFOL N/A 10/26/2021   Procedure: ESOPHAGOGASTRODUODENOSCOPY (EGD) WITH PROPOFOL;  Surgeon: Sharyn Creamer, MD;  Location: [place];  Service: Gastroenterology;  Laterality: N/A;   ESOPHAGOGASTRODUODENOSCOPY (EGD) WITH PROPOFOL N/A 03/08/2022   Procedure: ESOPHAGOGASTRODUODENOSCOPY (EGD) WITH PROPOFOL;  Surgeon: Jackquline Denmark, MD;  Location: [place];  Service: Gastroenterology;  Laterality: N/A;   GIVENS CAPSULE STUDY N/A 05/19/2020   Procedure: GIVENS CAPSULE STUDY;  Surgeon: [name], MD;  Location: [place];  Service: Endoscopy;  Laterality: N/A;  .adm for obs since pacemaker, PA wil enter order and see pt   GIVENS CAPSULE STUDY N/A 05/29/2020   Procedure: GIVENS CAPSULE STUDY;  Surgeon: Doran Stabler, MD;  Location: [place];  Service: Gastroenterology;  Laterality: N/A;   HEMATOMA EVACUATION Left 09/24/2020   Procedure: EVACUATION HEMATOMA ARM;  Surgeon: Rosetta Posner, MD;  Location: [facility] OR;  Service: Vascular;  Laterality: Left;   HOT HEMOSTASIS N/A 10/26/2017   Procedure: HOT HEMOSTASIS (ARGON PLASMA COAGULATION/BICAP);  Surgeon: Ladene Artist, MD;   Location[facility];  Service: Endoscopy;  Laterality: N/A;   HOT HEMOSTASIS N/A 03/02/2020   Procedure: HOT HEMOSTASIS (ARGON PLASMA COAGULATION/BICAP);  Surgeon: Yetta Flock, MD;  Location: [facility] ENDOSCOPY;  Service: Gastroenterology;  Laterality: N/A;   HOT HEMOSTASIS N/A 05/31/2020   Procedure: HOT HEMOSTASIS (ARGON PLASMA COAGULATION/BICAP);  Surgeon: Irene Shipper, MD;  Location: [facility] ENDOSCOPY;  Service: Endoscopy;  Laterality: N/A;   HOT HEMOSTASIS N/A 03/08/2022   Procedure: HOT HEMOSTASIS (ARGON PLASMA COAGULATION/BICAP);  Surgeon: Jackquline Denmark, MD;  Location: [facility] ENDOSCOPY;  Service: Gastroenterology;  Laterality: N/A;   INSERT / REPLACE / REMOVE PACEMAKER  ?2008   IR PERC TUN PERIT CATH WO PORT S&I /IMAG  07/05/2020   PERIPHERAL VASCULAR INTERVENTION Left 03/28/2021   Procedure: PERIPHERAL VASCULAR INTERVENTION;  Surgeon: Waynetta Sandy, MD;  Location: [place] [facility];  Service: Cardiovascular;  Laterality: Left;   POLYPECTOMY  11/08/2017   Procedure: POLYPECTOMY;  Surgeon: Milus Banister, MD;  [facility];  Service: Endoscopy;;   PROCTOSCOPY N/A 05/22/2018   Procedure: RIGID PROCTOSCOPY;  Surgeon: Michael Boston, MD;  [facility];  Service: General;  Laterality: N/A;   RIGHT HEART CATH N/A 02/13/2018   Procedure: RIGHT HEART CATH;  Surgeon: [name], MD;  Location: [place] [facility];  Service: Cardiovascular;  Laterality: N/A;   RIGHT HEART CATH N/A 06/23/2020   Procedure: RIGHT HEART CATH;  Surgeon: [name], MD;  Location: [place] [facility];  Service: Cardiovascular;  Laterality: N/A;   RIGHT/LEFT HEART CATH AND CORONARY ANGIOGRAPHY N/A 12/11/2019   Procedure: RIGHT/LEFT HEART CATH AND CORONARY ANGIOGRAPHY;  Surgeon: [name], MD;  Location: [place] [facility];  Service: Cardiovascular;  Laterality: N/A;   SUBMUCOSAL TATTOO INJECTION  03/02/2020   Procedure: SUBMUCOSAL TATTOO INJECTION;  Surgeon: Yetta Flock,  MD;  Location: [facility] ENDOSCOPY;  Service: Gastroenterology;;   SUBMUCOSAL TATTOO INJECTION  05/31/2020   Procedure: SUBMUCOSAL TATTOO INJECTION;  Surgeon: Irene Shipper, MD;  Location: [place];  Service: Endoscopy;;   TEE WITHOUT CARDIOVERSION N/A 08/02/2021   Procedure: TRANSESOPHAGEAL ECHOCARDIOGRAM (TEE);  Surgeon: [name], MD;  Location: [facility] ENDOSCOPY;  Service: Cardiovascular;  Laterality: N/A;   TUBAL LIGATION     XI ROBOTIC ASSISTED LOWER ANTERIOR RESECTION N/A 05/22/2018   Procedure: XI ROBOTIC ASSISTED SIGMOID COLOECTOMY MOBILIZATION OF SPLENIC FLEXURE, FIREFLY ASSESSMENT OF PERFUSION;  Surgeon: Michael Boston, MD;  [facility];  Service: General;  Laterality: N/A;  ERAS PATHWAY   Family History  Problem Relation Age of Onset   Asthma Father    Heart attack Father    Asthma Sister    Lung cancer Sister    Heart attack Mother    Stroke Brother    Colon cancer Neg Hx    Esophageal cancer Neg Hx    Pancreatic cancer Neg Hx    Stomach cancer Neg Hx    Liver disease Neg Hx    Social History:  reports that she has never smoked. She has never used smokeless tobacco. She reports that she does not currently use alcohol. She reports that she does not currently use drugs after having used the following drugs: Marijuana.  ROS: As per HPI otherwise negative.  Physical Exam: Vitals:   03/28/22 0800 03/28/22 0930 03/28/22 1044 03/28/22 1123  BP: (!) 152/66 (!) 157/80  (!) 161/73  Pulse: 75 78 90 80  Resp: '19 19  18  '$ Temp:  98.4 F (36.9 C)  98.4 F (36.9 C)  TempSrc:  Oral  Oral  SpO2: 98% 96% 99% (!) 87%  Weight:  55.3 kg    Height:  '4\' 11"'$  (1.499 m)       General: Well developed, well nourished, in no acute distress. Room air. Head: Normocephalic, atraumatic, sclera non-icteric, mucus membranes are moist. Neck: Supple without lymphadenopathy/masses. JVD not elevated. Lungs: Rhonchi/wheezing throughout all lung fields Heart: RRR with normal S1, S2. No murmurs,  rubs, or gallops appreciated. Abdomen: Soft, non-tender, non-distended with normoactive bowel sounds.  Musculoskeletal:  Strength and tone appear normal for age. Lower extremities: No edema or ischemic changes, no open wounds. Neuro: Alert and oriented X 3. Moves all extremities spontaneously. Psych:  Responds to questions appropriately with a normal affect. Dialysis Access: L AVF + thrill  No Known Allergies Prior to Admission medications   Medication Sig Start Date End Date Taking? Authorizing Provider  amiodarone (PACERONE) 200 MG tablet Take 1 tablet (200 mg total) by mouth daily. 11/10/21  Yes Saguier, Percell Miller, PA-C  apixaban (ELIQUIS) 2.5 MG TABS tablet Take 1 tablet (2.5 mg total) by mouth 2 (two) times daily. 03/11/22  Yes Pokhrel, Laxman, MD  benzonatate (TESSALON) 100 MG capsule Take 1 capsule (100 mg  total) by mouth 3 (three) times daily as needed for cough. 03/20/22  Yes Saguier, Percell Miller, PA-C  diazepam (VALIUM) 5 MG tablet 1 tab po bid prn anxiety Patient taking differently: Take 5 mg by mouth 2 (two) times daily as needed for anxiety. 01/19/22  Yes Saguier, Percell Miller, PA-C  doxycycline (VIBRA-TABS) 100 MG tablet Take 1 tablet (100 mg total) by mouth 2 (two) times daily. 03/20/22  Yes Saguier, Percell Miller, PA-C  gabapentin (NEURONTIN) 300 MG capsule Take 1 capsule (300 mg total) by mouth daily. Fill one rx. Computer may have sent over 2 Patient taking differently: Take 300 mg by mouth daily as needed (pain). 01/10/22  Yes Saguier, Percell Miller, PA-C  HYDROcodone bit-homatropine (HYCODAN) 5-1.5 MG/5ML syrup Take 5 mLs by mouth every 8 (eight) hours as needed for cough. 03/24/22  Yes Saguier, Percell Miller, PA-C  hydrOXYzine (ATARAX) 10 MG tablet 1 tab po q hs prn itching Patient taking differently: Take 10 mg by mouth at bedtime as needed for itching. 02/07/22  Yes Saguier, Percell Miller, PA-C  ipratropium (ATROVENT HFA) 17 MCG/ACT inhaler Inhale 2 puffs into the lungs every 6 (six) hours as needed for wheezing.  03/24/22  Yes Saguier, Percell Miller, PA-C  levothyroxine (SYNTHROID) 125 MCG tablet Take 1 tablet (125 mcg total) by mouth daily before breakfast. 01/16/22  Yes Saguier, Percell Miller, PA-C  pantoprazole (PROTONIX) 40 MG tablet Take 1 tablet (40 mg total) by mouth 2 (two) times daily before a meal. 03/09/22  Yes Pokhrel, Laxman, MD  cyclobenzaprine (FLEXERIL) 5 MG tablet Take 5 mg by mouth at bedtime as needed for muscle spasms. 12/07/21   [provider]  lidocaine-prilocaine (EMLA) cream Apply 1 Application topically 3 (three) times a week. 09/08/21   [provider]  methylPREDNISolone (MEDROL) 4 MG tablet 3 tab po day 1, 2 tab po day 2 and 1 tab po day 3 03/20/22   Saguier, Percell Miller, PA-C  nitroGLYCERIN (NITROSTAT) 0.4 MG SL tablet Place 1 tablet (0.4 mg total) under the tongue every 5 (five) minutes as needed for chest pain. 10/27/21   Thurnell Lose, MD  rOPINIRole (REQUIP) 0.25 MG tablet Take 0.25 mg by mouth at bedtime. 12/08/21   [provider]  traZODone (DESYREL) 50 MG tablet Take 0.5-1 tablets (25-50 mg total) by mouth at bedtime as needed for sleep. 12/16/20   Saguier, Percell Miller, PA-C  XIIDRA 5 % SOLN Place 1 drop into both eyes at bedtime. 11/23/21   [provider]   Current Facility-Administered Medications  Medication Dose Route Frequency Provider Last Rate Last Admin   amiodarone (PACERONE) tablet 200 mg  200 mg Oral Daily Leanord Asal K, DO   200 mg at 03/28/22 3428   apixaban (ELIQUIS) tablet 2.5 mg  2.5 mg Oral BID Leanord Asal K, DO   2.5 mg at 03/28/22 7681   benzonatate (TESSALON) capsule 100 mg  100 mg Oral TID PRN Leanord Asal K, DO       ipratropium (ATROVENT HFA) inhaler 2 puff  2 puff Inhalation Q6H PRN Maylon Peppers, Victoria K, DO       levothyroxine (SYNTHROID) tablet 125 mcg  125 mcg Oral QAC breakfast Leanord Asal K, DO   125 mcg at 03/28/22 0727   pantoprazole (PROTONIX) EC tablet 40 mg  40 mg Oral BID AC Kingsley, Victoria K, DO   40  mg at 03/28/22 1509   Labs: Basic Metabolic Panel: Recent Labs  Lab 03/27/22 1650  NA 138  K 3.4*  CL 94*  CO2 31  GLUCOSE 97  BUN 14  CREATININE 4.71*  CALCIUM 8.2*   Liver Function Tests: Recent Labs  Lab 03/27/22 1650  AST 21  ALT 11  ALKPHOS 79  BILITOT 0.9  PROT 8.7*  ALBUMIN 3.8   CBC: Recent Labs  Lab 03/27/22 1650  WBC 5.9  NEUTROABS 3.8  HGB 9.9*  HCT 31.7*  MCV 94.6  PLT 217   Studies/Results: DG Chest 2 View  Result Date: 03/27/2022 CLINICAL DATA:  Shortness of breath and cough for 1 week EXAM: CHEST - 2 VIEW COMPARISON:  03/24/2022 FINDINGS: AICD noted. Stable moderate enlargement of the cardiopericardial silhouette. Atherosclerotic calcification of the aortic arch. Perihilar interstitial accentuation noted without definite Kerley B lines, potentially from atypical pneumonia or mild interstitial edema. No pleural effusion. IMPRESSION: 1. Stable moderate enlargement of the cardiopericardial silhouette. 2. Perihilar interstitial accentuation noted without definite Kerley B lines, potentially from atypical pneumonia or mild interstitial edema. 3. AICD noted. Electronically Signed   By: Van Clines M.D.   On: 03/27/2022 17:39    Dialysis Orders:  MWF at [facility] 3:30hr, 400/600, EDW  56.2kg, 2K/2Ca, UFP #2, AVF, no heparin - Mircera 185mg IV q 2 weeks - Hectoral 344m IV q HD - Sensipar '30mg'$  PO q HD  Assessment/Plan:  Productive cough/reactive airway disease: Diffuse rhonchi/wheezing - would likely benefit from ongoing inhalers +/- course of steroids. Consider alternative abx - finished doxycycline without improvement. Consider chest CT given persistent issues. Although imaging suggests pulm edema, fluid-wise she looks ok and suspect more going on.  ESRD: Continue HD this week per Holiday schedule - for HD today (more than likely will be overnight). No heparin.   Hypertension/volume: BP slightly high, 2L UFG planned.  Anemia: Hgb 9.9 - follow.   Metabolic bone disease: Ca ok, continue home meds/binders.  T2DM  HFrEF with AICD  Hx recurrent GIB: Continue PPI A-fib/flutter: On Eliquis  KaPollyann Kennedy1/21/2023, 3:17 PM  [employer]

## 2022-03-28 NOTE — Telephone Encounter (Signed)
Please schedule f/u for chronic cough with Dr. Melvyn Novas in next 4-6 weeks, new visit, 30 minutes.

## 2022-03-28 NOTE — Consult Note (Signed)
NAME:  Morgan Clay, MRN:  [id number], DOB:  24-Dec-1945, LOS: 0 ADMISSION DATE:  03/27/2022, CONSULTATION DATE:  11/21 REFERRING MD:  Levester Fresh FOR CONSULT: Cough   History of Present Illness:  Morgan Clay is an 76 y.o. F who presented to [facility] on 11/20 with complaints of cough and SOB for 1 week.  Per patient, EGD completed sometime in June with start of cough. Patient with EGD on 11/1. Cough present on and off after procedure. She presented to her PCP on 11/17 with c/o cough. She was treated with doxy, steroids, inhalers.   She presented to primary care on 11/20 for continued cough, dyspnea. Was transferred to [facility]- Troponin 55, BNP 1685. CXR with possible pulmonary edema, no obvious infiltrate. Remains stable on room air.   Endorses hx of asthma when younger, not currently on albuterol. States she was diagnosed with GERD s/p EGD. Denies hx of post nasal drip.  Patient states the Cough is worse at night when lying down.  TR[facility] was consulted for admission. PCCM was consulted for assistance with cough.   Pertinent  Medical History  ESRD on HD, HFrEF with AICD, CAD, A-fib/flutter, DM 2, GERD, OSA, hep C cirrhosis Significant Hospital Events: Including procedures, antibiotic start and stop dates in addition to other pertinent events   11/21 PCCM consult  Interim History / Subjective:  See above.  Subjective: some SOB but normal amount Objective   Blood pressure (!) 161/73, pulse 80, temperature 98.4 F (36.9 C), temperature source Oral, resp. rate 18, height '4\' 11"'$  (1.499 m), weight 55.3 kg, SpO2 (!) 87 %.       No intake or output data in the 24 hours ending 03/28/22 1635 Filed Weights   03/27/22 1634 03/28/22 0930  Weight: 57.2 kg 55.3 kg    Examination: General: In bed, NAD, appears comfortable, hoarse voice HEENT: MM pink/moist, anicteric, atraumatic Neuro: RASS 0, PERRL 30m, GCS 15 CV: S1S2, paced, no m/r/g appreciated PULM:  expiratory wheezes, trachea  midline, chest expansion symmetric GI: soft, bsx4 active, non-tender   Extremities: skin warm, dry, capillary refill less than 3 seconds  Skin:  no rashes or lesions noted  HGB 9.1>9.9 K 3.4 Creat 7.2>4.7 Trop 55>54, BNP 1685 Covid neg CXR with possible pulmonary edema, no obvious infiltrate. Remains stable on room air.   Resolved Hospital Problem list     Assessment & Plan:  Cough Patient with nonproductive cough that longest dates back to June 23 or soonest started 11/1. eosinophils 3. No leukocytosis. No infiltrate.  History of GERD.  Patient states the cough is worse at night when laying down. Suspect reflux while patient is supine and asleep is culprit.  Patient without hypoxia, making amiodarone toxicity less likely. Trop 55>54, BNP 1685.  Echo 12/22/2021 LVEF 50 to 55%. On room air, O2 sats greater than 98% -check CRP and sed rate -Holding on CT scan at this time.  We will consider scan based on CRP and sed rate -Continue Protonix 40 mg twice daily -Check RVP -start scheduled arformoterol, budesonide, yupelri, PRN xoponex  -Recommend follow up with outpatient pulm  PCCM will continue to follow along.  All other issues per primary  Best Practice (right click and "Reselect all SmartList Selections" daily)   Per primary  Labs   CBC: Recent Labs  Lab 03/27/22 1650  WBC 5.9  NEUTROABS 3.8  HGB 9.9*  HCT 31.7*  MCV 94.6  PLT 2127   Basic Metabolic Panel: Recent Labs  Lab 03/27/22  1650  NA 138  K 3.4*  CL 94*  CO2 31  GLUCOSE 97  BUN 14  CREATININE 4.71*  CALCIUM 8.2*   GFR: Estimated Creatinine Clearance: 7.9 mL/min (A) (by C-G formula based on SCr of 4.71 mg/dL (H)). Recent Labs  Lab 03/27/22 1650  WBC 5.9    Liver Function Tests: Recent Labs  Lab 03/27/22 1650  AST 21  ALT 11  ALKPHOS 79  BILITOT 0.9  PROT 8.7*  ALBUMIN 3.8   No results for input(s): "LIPASE", "AMYLASE" in the last 168 hours. No results for input(s): "AMMONIA" in the  last 168 hours.  ABG    Component Value Date/Time   PHART 7.364 08/12/2017 1353   PCO2ART 43.1 08/12/2017 1353   PO2ART 79.3 (L) 08/12/2017 1353   HCO3 24.6 06/23/2020 0931   HCO3 25.2 06/23/2020 0931   TCO2 31 03/08/2022 0915   ACIDBASEDEF 2.0 06/23/2020 0931   ACIDBASEDEF 1.0 06/23/2020 0931   O2SAT 64.0 06/23/2020 0931   O2SAT 64.0 06/23/2020 0931     Coagulation Profile: No results for input(s): "INR", "PROTIME" in the last 168 hours.  Cardiac Enzymes: No results for input(s): "CKTOTAL", "CKMB", "CKMBINDEX", "TROPONINI" in the last 168 hours.  HbA1C: Hgb A1c MFr Bld  Date/Time Value Ref Range Status  10/06/2021 11:59 AM 4.9 4.6 - 6.5 % Final    Comment:    Glycemic Control Guidelines for People with Diabetes:Non Diabetic:  <6%Goal of Therapy: <7%Additional Action Suggested:  >8%   10/19/2020 03:25 PM 5.3 4.6 - 6.5 % Final    Comment:    Glycemic Control Guidelines for People with Diabetes:Non Diabetic:  <6%Goal of Therapy: <7%Additional Action Suggested:  >8%     CBG: No results for input(s): "GLUCAP" in the last 168 hours.  Review of Systems:   Positives in bold  Gen: fever, chills, weight change, fatigue, night sweats HEENT:  blurred vision, double vision, hearing loss, tinnitus, sinus congestion, rhinorrhea, sore throat, neck stiffness, dysphagia PULM:  shortness of breath, cough, sputum production, hemoptysis, wheezing CV: chest pain, edema, orthopnea, paroxysmal nocturnal dyspnea, palpitations GI:  abdominal pain, nausea, vomiting, diarrhea, hematochezia, melena, constipation, change in bowel habits GU: dysuria, hematuria, polyuria, oliguria, urethral discharge Endocrine: hot or cold intolerance, polyuria, polyphagia or appetite change Derm: rash, dry skin, scaling or peeling skin change Heme: easy bruising, bleeding, bleeding gums Neuro: headache, numbness, weakness, slurred speech, loss of memory or consciousness   Past Medical History:  She,  has a  past medical history of Angiodysplasia of small intestine (06/10/2020), Aortic atherosclerosis (HCC) (08/30/2017), Arthritis, Asthma, Biventricular ICD (implantable cardioverter-defibrillator) in place (01/08/2007), Bleeding pseudoaneurysm of left brachiocephalic arteriovenous fistula ([place]) (09/24/2020), Chronic obstructive pulmonary disease ([place]), Chronic systolic congestive heart failure ([place]), 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 (HCV) ([place]), Diverticulitis of left colon status post robotic low anterior to sigmoid resection 05/22/2018 (07/31/2017), End stage renal disease ([place]) (03/22/2021), Glaucoma, Gout, History of blood transfusion (~ 11/2017), Hypertension associated with diabetes ([place]) (06/07/2017), Hypothyroidism, LBBB (left bundle branch block), Malnutrition of moderate degree (07/09/2020), MDD (major depressive disorder), recurrent, in partial remission ([place]) (06/07/2017), OBSTRUCTIVE SLEEP APNEA (11/14/2007), OSTEOPENIA (09/30/2008), Persistent atrial fibrillation ([place]) (02/06/2020), Polyneuropathy associated with underlying disease ([place]) (06/09/2021), Pulmonary hypertension ([place]) on echocardiogram (01/14/2018), Secondary esophageal varices without bleeding ([place]) (12/23/2020), Secondary hypercoagulable state ([place]) (02/06/2020), and Type 2 diabetes, controlled, with renal manifestation ([place]) (11/24/2013).   Surgical History:   Past Surgical History:  Procedure Laterality Date   A/V FISTULAGRAM N/A 03/28/2021  Procedure: A/V FISTULAGRAM;  Surgeon: Waynetta Sandy, MD;  Location: [place] [facility];  Service: Cardiovascular;  Laterality: N/A;   APPENDECTOMY     AV FISTULA PLACEMENT Left 08/05/2020   Procedure: LEFT UPPER EXTREMITY ARTERIOVENOUS (AV) FISTULA CREATION;  Surgeon: Cherre Robins, MD;  Location: [place];  Service: Vascular;  Laterality: Left;   Bells Left 09/16/2020   Procedure: LEFT SECOND STAGE [place];  Surgeon:  Cherre Robins, MD;  Location: [place];  Service: Vascular;  Laterality: Left;  PERIPHERAL NERVE BLOCK   CARDIAC CATHETERIZATION     CARDIOVERSION N/A 12/14/2014   Procedure: CARDIOVERSION;  Surgeon: Dorothy Spark, MD;  Location: [place];  Service: Cardiovascular;  Laterality: N/A;   CARDIOVERSION N/A 02/26/2017   Procedure: CARDIOVERSION;  Surgeon: Evans Lance, MD;  Location: [place] [facility];  Service: Cardiovascular;  Laterality: N/A;   CARDIOVERSION N/A 07/24/2017   Procedure: CARDIOVERSION;  Surgeon: Acie Fredrickson Wonda Cheng, MD;  Location: [facility] ENDOSCOPY;  Service: Cardiovascular;  Laterality: N/A;   CARDIOVERSION N/A 02/12/2020   Procedure: CARDIOVERSION;  Surgeon: [name], MD;  Location: [facility] ENDOSCOPY;  Service: Cardiovascular;  Laterality: N/A;   CARDIOVERSION N/A 09/15/2021   Procedure: CARDIOVERSION;  Surgeon: [name], MD;  Location: [facility] ENDOSCOPY;  Service: Cardiovascular;  Laterality: N/A;   CARPAL TUNNEL RELEASE  07/21/2021   COLONOSCOPY N/A 11/08/2017   Procedure: COLONOSCOPY;  Surgeon: Milus Banister, MD;  Location: [facility] ENDOSCOPY;  Service: Endoscopy;  Laterality: N/A;   COLONOSCOPY WITH PROPOFOL N/A 05/31/2020   Procedure: COLONOSCOPY WITH PROPOFOL;  Surgeon: Irene Shipper, MD;  Location: [facility]COPY;  Service: Endoscopy;  Laterality: N/A;   COLONOSCOPY WITH PROPOFOL N/A 10/26/2021   Procedure: COLONOSCOPY WITH PROPOFOL;  Surgeon: Sharyn Creamer, MD;  Location: [place];  Service: Gastroenterology;  Laterality: N/A;   DILATION AND CURETTAGE OF UTERUS     ENTEROSCOPY N/A 03/02/2020   Procedure: ENTEROSCOPY;  Surgeon: Yetta Flock, MD;  Location[facility]COPY;  Service: Gastroenterology;  Laterality: N/A;   EP IMPLANTABLE DEVICE N/A 10/06/2015   Procedure: BIV ICD Generator Changeout;  Surgeon: [name], MD;  Location: [place] [facility];  Service: Cardiovascular;  Laterality: N/A;   ESOPHAGOGASTRODUODENOSCOPY N/A 10/26/2017   Procedure:  ESOPHAGOGASTRODUODENOSCOPY (EGD);  Surgeon: Ladene Artist, MD;  Location: [facility];  Service: Endoscopy;  Laterality: N/A;   ESOPHAGOGASTRODUODENOSCOPY (EGD) WITH PROPOFOL N/A 11/07/2017   Procedure: ESOPHAGOGASTRODUODENOSCOPY (EGD) WITH PROPOFOL;  Surgeon: Milus Banister, MD;  [facility];  Service: Endoscopy;  Laterality: N/A;   ESOPHAGOGASTRODUODENOSCOPY (EGD) WITH PROPOFOL N/A 05/29/2020   Procedure: ESOPHAGOGASTRODUODENOSCOPY (EGD) WITH PROPOFOL;  Surgeon: Doran Stabler, MD;  Location: [place];  Service: Gastroenterology;  Laterality: N/A;   ESOPHAGOGASTRODUODENOSCOPY (EGD) WITH PROPOFOL N/A 10/26/2021   Procedure: ESOPHAGOGASTRODUODENOSCOPY (EGD) WITH PROPOFOL;  Surgeon: Sharyn Creamer, MD;  Location: [place];  Service: Gastroenterology;  Laterality: N/A;   ESOPHAGOGASTRODUODENOSCOPY (EGD) WITH PROPOFOL N/A 03/08/2022   Procedure: ESOPHAGOGASTRODUODENOSCOPY (EGD) WITH PROPOFOL;  Surgeon: Jackquline Denmark, MD;  Location: [place];  Service: Gastroenterology;  Laterality: N/A;   GIVENS CAPSULE STUDY N/A 05/19/2020   Procedure: GIVENS CAPSULE STUDY;  Surgeon: [name], MD;  Location: [place];  Service: Endoscopy;  Laterality: N/A;  .adm for obs since pacemaker, PA wil enter order and see pt   GIVENS CAPSULE STUDY N/A 05/29/2020   Procedure: GIVENS CAPSULE STUDY;  Surgeon: Doran Stabler, MD;  Location:  [place] ENDOSCOPY;  Service: Gastroenterology;  Laterality: N/A;   HEMATOMA EVACUATION Left 09/24/2020   Procedure: EVACUATION HEMATOMA ARM;  Surgeon: Rosetta Posner, MD;  Location: [facility];  Service: Vascular;  Laterality: Left;   HOT HEMOSTASIS N/A 10/26/2017   Procedure: HOT HEMOSTASIS (ARGON PLASMA COAGULATION/BICAP);  Surgeon: Ladene Artist, MD;  Location: [facility];  Service: Endoscopy;  Laterality: N/A;   HOT HEMOSTASIS N/A 03/02/2020   Procedure: HOT HEMOSTASIS (ARGON PLASMA COAGULATION/BICAP);  Surgeon: Yetta Flock, MD;  Location:  [facility] ENDOSCOPY;  Service: Gastroenterology;  Laterality: N/A;   HOT HEMOSTASIS N/A 05/31/2020   Procedure: HOT HEMOSTASIS (ARGON PLASMA COAGULATION/BICAP);  Surgeon: Irene Shipper, MD;  Location: [facility] ENDOSCOPY;  Service: Endoscopy;  Laterality: N/A;   HOT HEMOSTASIS N/A 03/08/2022   Procedure: HOT HEMOSTASIS (ARGON PLASMA COAGULATION/BICAP);  Surgeon: Jackquline Denmark, MD;  Location: [facility] ENDOSCOPY;  Service: Gastroenterology;  Laterality: N/A;   INSERT / REPLACE / REMOVE PACEMAKER  ?2008   IR PERC TUN PERIT CATH WO PORT S&I /IMAG  07/05/2020   PERIPHERAL VASCULAR INTERVENTION Left 03/28/2021   Procedure: PERIPHERAL VASCULAR INTERVENTION;  Surgeon: Waynetta Sandy, MD;  Location: [place] [facility];  Service: Cardiovascular;  Laterality: Left;   POLYPECTOMY  11/08/2017   Procedure: POLYPECTOMY;  Surgeon: Milus Banister, MD;  [facility];  Service: Endoscopy;;   PROCTOSCOPY N/A 05/22/2018   Procedure: RIGID PROCTOSCOPY;  Surgeon: Michael Boston, MD;  [facility];  Service: General;  Laterality: N/A;   RIGHT HEART CATH N/A 02/13/2018   Procedure: RIGHT HEART CATH;  Surgeon: [name], MD;  Location: [place] [facility];  Service: Cardiovascular;  Laterality: N/A;   RIGHT HEART CATH N/A 06/23/2020   Procedure: RIGHT HEART CATH;  Surgeon: [name], MD;  Location: [place] [facility];  Service: Cardiovascular;  Laterality: N/A;   RIGHT/LEFT HEART CATH AND CORONARY ANGIOGRAPHY N/A 12/11/2019   Procedure: RIGHT/LEFT HEART CATH AND CORONARY ANGIOGRAPHY;  Surgeon: [name], MD;  Location: [place] [facility];  Service: Cardiovascular;  Laterality: N/A;   SUBMUCOSAL TATTOO INJECTION  03/02/2020   Procedure: SUBMUCOSAL TATTOO INJECTION;  Surgeon: Yetta Flock, MD;  Location: [facility] ENDOSCOPY;  Service: Gastroenterology;;   SUBMUCOSAL TATTOO INJECTION  05/31/2020   Procedure: SUBMUCOSAL TATTOO INJECTION;  Surgeon: Irene Shipper, MD;  Location: [place];   Service: Endoscopy;;   TEE WITHOUT CARDIOVERSION N/A 08/02/2021   Procedure: TRANSESOPHAGEAL ECHOCARDIOGRAM (TEE);  Surgeon: [name], MD;  Location: [facility] ENDOSCOPY;  Service: Cardiovascular;  Laterality: N/A;   TUBAL LIGATION     XI ROBOTIC ASSISTED LOWER ANTERIOR RESECTION N/A 05/22/2018   Procedure: XI ROBOTIC ASSISTED SIGMOID COLOECTOMY MOBILIZATION OF SPLENIC FLEXURE, FIREFLY ASSESSMENT OF PERFUSION;  Surgeon: Michael Boston, MD;  [facility];  Service: General;  Laterality: N/A;  ERAS PATHWAY     Social History:   reports that she has never smoked. She has never used smokeless tobacco. She reports that she does not currently use alcohol. She reports that she does not currently use drugs after having used the following drugs: Marijuana.   Family History:  Her family history includes Asthma in her father and sister; Heart attack in her father and mother; Lung cancer in her sister; Stroke in her brother. There is no history of Colon cancer, Esophageal cancer, Pancreatic cancer, Stomach cancer, or Liver disease.   Allergies No Known Allergies   Home Medications  Prior to Admission medications   Medication Sig Start  Date End Date Taking? Authorizing Provider  amiodarone (PACERONE) 200 MG tablet Take 1 tablet (200 mg total) by mouth daily. 11/10/21  Yes Saguier, Percell Miller, PA-C  apixaban (ELIQUIS) 2.5 MG TABS tablet Take 1 tablet (2.5 mg total) by mouth 2 (two) times daily. 03/11/22  Yes Pokhrel, Laxman, MD  benzonatate (TESSALON) 100 MG capsule Take 1 capsule (100 mg total) by mouth 3 (three) times daily as needed for cough. 03/20/22  Yes Saguier, Percell Miller, PA-C  diazepam (VALIUM) 5 MG tablet 1 tab po bid prn anxiety Patient taking differently: Take 5 mg by mouth 2 (two) times daily as needed for anxiety. 01/19/22  Yes Saguier, Percell Miller, PA-C  gabapentin (NEURONTIN) 300 MG capsule Take 1 capsule (300 mg total) by mouth daily. Fill one rx. Computer may have sent over 2 Patient taking  differently: Take 300 mg by mouth daily as needed (pain). 01/10/22  Yes Saguier, Percell Miller, PA-C  HYDROcodone bit-homatropine (HYCODAN) 5-1.5 MG/5ML syrup Take 5 mLs by mouth every 8 (eight) hours as needed for cough. 03/24/22  Yes Saguier, Percell Miller, PA-C  hydrOXYzine (ATARAX) 10 MG tablet 1 tab po q hs prn itching Patient taking differently: Take 10 mg by mouth at bedtime as needed for itching. 02/07/22  Yes Saguier, Percell Miller, PA-C  ipratropium (ATROVENT HFA) 17 MCG/ACT inhaler Inhale 2 puffs into the lungs every 6 (six) hours as needed for wheezing. 03/24/22  Yes Saguier, Percell Miller, PA-C  levothyroxine (SYNTHROID) 125 MCG tablet Take 1 tablet (125 mcg total) by mouth daily before breakfast. 01/16/22  Yes Saguier, Percell Miller, PA-C  pantoprazole (PROTONIX) 40 MG tablet Take 1 tablet (40 mg total) by mouth 2 (two) times daily before a meal. 03/09/22  Yes Pokhrel, Laxman, MD  cyclobenzaprine (FLEXERIL) 5 MG tablet Take 5 mg by mouth at bedtime as needed for muscle spasms. 12/07/21   [provider]  lidocaine-prilocaine (EMLA) cream Apply 1 Application topically 3 (three) times a week. 09/08/21   [provider]  nitroGLYCERIN (NITROSTAT) 0.4 MG SL tablet Place 1 tablet (0.4 mg total) under the tongue every 5 (five) minutes as needed for chest pain. 10/27/21   Thurnell Lose, MD  rOPINIRole (REQUIP) 0.25 MG tablet Take 0.25 mg by mouth at bedtime. 12/08/21   [provider]  traZODone (DESYREL) 50 MG tablet Take 0.5-1 tablets (25-50 mg total) by mouth at bedtime as needed for sleep. 12/16/20   Saguier, Percell Miller, PA-C  XIIDRA 5 % SOLN Place 1 drop into both eyes at bedtime. 11/23/21   [provider]     Critical care time: N/A    [employer] MSN, APRN, AGACNP-BC [facility]  03/28/2022 , 5:34 PM  Please see [email or web address] for pager details  If no response, please call [phone] After hours, please call Elink at [phone]

## 2022-03-28 NOTE — ED Provider Notes (Signed)
Patient signed out to me pending admission bed at [facility].  In short this is a 76 year old female with past medical history of asthma and ESRD presented to the emergency department with cough.  She had significant increased work of breathing and ambulation plan was for admission for continued treatment of her cough.  On my evaluation, the patient is asleep in bed resting comfortably satting well on room air.  She is due for dialysis today.  She will repeat BMP performed to evaluate electrolytes and her morning meds were ordered including her cough medication and inhaler.   Kemper Durie, DO 03/28/22 [phone]

## 2022-03-28 NOTE — ED Notes (Addendum)
Respiratory at bedside to assess pt due to increase in cough and SOB with exertion to BR. Head to toe assessment completed in Flowsheets

## 2022-03-28 NOTE — H&P (Signed)
History and Physical    Patient: Morgan Clay BSW:[id number] DOB: 06/23/1945 DOA: 03/27/2022 DOS: the patient was seen and examined on 03/28/2022 PCP: Mackie Pai, PA-C  Patient coming from: Home - lives with daughter, grandson; NOK: Daughter, Delbert Harness, [phone]   Chief Complaint: SOB  HPI: Morgan Clay is a 76 y.o. female with medical history significant of ESRD on MWF HD; DM; afib on Eliquis; chronic systolic CHF with AICD placement; COPD; Hep C cirrhosis; glaucoma; HTN; hypothyroidism; and OSA presenting with SOB.  She was sent over by pulm yesterday after PCP had been treating with antibiotics, steroids, and cough suppressants without improvement.  She was admitted for a GI bleed in early November.  Around that time, she noticed a cough forming.  It is a dry cough associated with SOB.  There does not seem to be any pattern to it - cough and SOB occur sporadically, at rest or with exertion, lying flat or not.  No LE edema, no weight changes.  Non-productive cough.  No hypoxia.  No fever.  She was seen by her PCP on 11/13 for this issue and was given doxy, Tessalon, and Albuterol.  She returned on 11/17 and was given Atrovent, hycodan.  COVID/flu negative.  She returned again yesterday and was sent to the ER.  She denies h/o lung problems, although she does have a strong FH of asthma.    ER Course:  [facility] to [facility] transfer, per Dr. Myna Hidalgo:  76 yr old lady with ESRD and a fib on Eliquis p/w 1-2 weeks of cough and SOB despite outpatient treatment with doxycycline, prednisone, and Atrovent. She is afebrile with stable vitals but said to be severely dyspneic with mild exertion in ED. CXR findings could reflect mild interstitial edema or atypical infection. She was treated with IV Solu-Medrol and Duoneb in ED. She was dialyzed yesterday.       Review of Systems: As mentioned in the history of present illness. All other systems reviewed and are negative. 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 ([place]) 09/24/2020   Chronic obstructive pulmonary disease ([place])    Chronic systolic congestive heart failure ([place])    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 (HCV) ([place])    HEPATITIS C - s/p treatment with Harvoni, saw hepatology, Dawn Drazek   Diverticulitis of left colon status post robotic low anterior to sigmoid resection 05/22/2018 07/31/2017   End stage renal disease ([place]) 03/22/2021   Glaucoma    Gout    History of blood transfusion ~ 11/2017   Hypertension associated with diabetes ([place]) 06/07/2017   Hypothyroidism    LBBB (left bundle branch block)    Malnutrition of moderate degree 07/09/2020   MDD (major depressive disorder), recurrent, in partial remission ([place]) 06/07/2017   OBSTRUCTIVE SLEEP APNEA 11/14/2007   no CPAP   OSTEOPENIA 09/30/2008   Persistent atrial fibrillation ([place]) 02/06/2020   Polyneuropathy associated with underlying disease ([place]) 06/09/2021   Pulmonary hypertension ([place]) on echocardiogram 01/14/2018   Secondary esophageal varices without  bleeding ([place]) 12/23/2020   Formatting of this note might be different from the original. Patient had trace esophageal varices during small bowel enteroscopy in October 2021, follow-up EGD in January 2022 with no varices.  She previously had GI bleeding thought to be from AVM versus GAVE.  She is no longer on carvedilol and will need surveillance endoscopy.  Patient was previously followed at [facility] [place] and I have asked  her    Secondary hypercoagulable state ([place]) 02/06/2020   Type 2 diabetes, controlled, with renal manifestation ([place]) 11/24/2013   Past Surgical History:  Procedure Laterality Date   A/V FISTULAGRAM N/A 03/28/2021   Procedure: A/V FISTULAGRAM;  Surgeon: Waynetta Sandy, MD;  Location: [place] [facility];  Service: Cardiovascular;  Laterality: N/A;   APPENDECTOMY     AV FISTULA PLACEMENT Left 08/05/2020   Procedure: LEFT UPPER EXTREMITY ARTERIOVENOUS (AV) FISTULA CREATION;  Surgeon: Cherre Robins, MD;  Location: [place];  Service: Vascular;  Laterality: Left;   [place] Left 09/16/2020   Procedure: LEFT SECOND STAGE [place];  Surgeon: Cherre Robins, MD;  Location: [place];  Service: Vascular;  Laterality: Left;  PERIPHERAL NERVE BLOCK   CARDIAC CATHETERIZATION     CARDIOVERSION N/A 12/14/2014   Procedure: CARDIOVERSION;  Surgeon: Dorothy Spark, MD;  Location: [place];  Service: Cardiovascular;  Laterality: N/A;   CARDIOVERSION N/A 02/26/2017   Procedure: CARDIOVERSION;  Surgeon: Evans Lance, MD;  Location: [place] [facility];  Service: Cardiovascular;  Laterality: N/A;   CARDIOVERSION N/A 07/24/2017   Procedure: CARDIOVERSION;  Surgeon: Acie Fredrickson Wonda Cheng, MD;  Location[facility] ENDOSCOPY;  Service: Cardiovascular;  Laterality: N/A;   CARDIOVERSION N/A 02/12/2020   Procedure: CARDIOVERSION;  Surgeon: [name], MD;  Location: [facility] ENDOSCOPY;  Service: Cardiovascular;  Laterality: N/A;   CARDIOVERSION N/A 09/15/2021   Procedure: CARDIOVERSION;  Surgeon: [name], MD;  Location: [facility] ENDOSCOPY;  Service: Cardiovascular;  Laterality: N/A;   CARPAL TUNNEL RELEASE  07/21/2021   COLONOSCOPY N/A 11/08/2017   Procedure: COLONOSCOPY;  Surgeon: Milus Banister, MD;  Location: [facility] ENDOSCOPY;  Service: Endoscopy;  Laterality: N/A;   COLONOSCOPY WITH PROPOFOL N/A 05/31/2020   Procedure: COLONOSCOPY WITH PROPOFOL;  Surgeon: Irene Shipper, MD;  Location: [facility] ENDOSCOPY;  Service: Endoscopy;  Laterality: N/A;   COLONOSCOPY WITH PROPOFOL N/A 10/26/2021   Procedure: COLONOSCOPY WITH PROPOFOL;  Surgeon: Sharyn Creamer, MD;  Location: [place];  Service: Gastroenterology;  Laterality: N/A;   DILATION AND CURETTAGE OF UTERUS     ENTEROSCOPY N/A 03/02/2020   Procedure: ENTEROSCOPY;  Surgeon: Yetta Flock, MD;  Location: [facility] ENDOSCOPY;  Service: Gastroenterology;  Laterality: N/A;   EP IMPLANTABLE DEVICE N/A 10/06/2015   Procedure: BIV ICD Generator Changeout;  Surgeon: [name], MD;  Location: [place] [facility];  Service: Cardiovascular;  Laterality: N/A;   ESOPHAGOGASTRODUODENOSCOPY N/A 10/26/2017   Procedure: ESOPHAGOGASTRODUODENOSCOPY (EGD);  Surgeon: Ladene Artist, MD;  Location[facility];  Service: Endoscopy;  Laterality: N/A;   ESOPHAGOGASTRODUODENOSCOPY (EGD) WITH PROPOFOL N/A 11/07/2017   Procedure: ESOPHAGOGASTRODUODENOSCOPY (EGD) WITH PROPOFOL;  Surgeon: Milus Banister, MD;  [facility];  Service: Endoscopy;  Laterality: N/A;   ESOPHAGOGASTRODUODENOSCOPY (EGD) WITH PROPOFOL N/A 05/29/2020   Procedure: ESOPHAGOGASTRODUODENOSCOPY (EGD) WITH PROPOFOL;  Surgeon: Doran Stabler, MD;  Location: [place];  Service: Gastroenterology;  Laterality: N/A;   ESOPHAGOGASTRODUODENOSCOPY (EGD) WITH PROPOFOL N/A 10/26/2021   Procedure: ESOPHAGOGASTRODUODENOSCOPY (EGD) WITH PROPOFOL;  Surgeon: Sharyn Creamer, MD;  Location: [place];  Service: Gastroenterology;  Laterality: N/A;   ESOPHAGOGASTRODUODENOSCOPY (EGD) WITH PROPOFOL N/A 03/08/2022   Procedure: ESOPHAGOGASTRODUODENOSCOPY (EGD) WITH PROPOFOL;  Surgeon: Jackquline Denmark, MD;  Location: [place];  Service: Gastroenterology;  Laterality: N/A;   GIVENS CAPSULE STUDY N/A 05/19/2020   Procedure: GIVENS CAPSULE STUDY;  Surgeon: [name], MD;  Location: [place];  Service: Endoscopy;  Laterality: N/A;  .adm for obs since pacemaker, PA  wil enter order and see pt   GIVENS CAPSULE STUDY N/A 05/29/2020   Procedure: GIVENS CAPSULE STUDY;  Surgeon: Doran Stabler, MD;  Location: [place];  Service: Gastroenterology;  Laterality: N/A;   HEMATOMA EVACUATION Left 09/24/2020   Procedure: EVACUATION HEMATOMA ARM;  Surgeon: Rosetta Posner, MD;  Location: [facility] OR;  Service: Vascular;  Laterality: Left;   HOT HEMOSTASIS N/A 10/26/2017   Procedure: HOT HEMOSTASIS (ARGON PLASMA COAGULATION/BICAP);  Surgeon: Ladene Artist, MD;  Location: [facility];  Service: Endoscopy;  Laterality: N/A;   HOT HEMOSTASIS N/A 03/02/2020   Procedure: HOT HEMOSTASIS (ARGON PLASMA COAGULATION/BICAP);  Surgeon: Yetta Flock, MD;  Location: [facility] ENDOSCOPY;  Service: Gastroenterology;  Laterality: N/A;   HOT HEMOSTASIS N/A 05/31/2020   Procedure: HOT HEMOSTASIS (ARGON PLASMA COAGULATION/BICAP);  Surgeon: Irene Shipper, MD;  Location: [facility] ENDOSCOPY;  Service: Endoscopy;  Laterality: N/A;   HOT HEMOSTASIS N/A 03/08/2022   Procedure: HOT HEMOSTASIS (ARGON PLASMA COAGULATION/BICAP);  Surgeon: Jackquline Denmark, MD;  Location: [facility] ENDOSCOPY;  Service: Gastroenterology;  Laterality: N/A;   INSERT / REPLACE / REMOVE PACEMAKER  ?2008   IR PERC TUN PERIT CATH WO PORT S&I /IMAG  07/05/2020   PERIPHERAL VASCULAR INTERVENTION Left 03/28/2021   Procedure: PERIPHERAL VASCULAR INTERVENTION;  Surgeon: Waynetta Sandy, MD;  Location: [place] [facility];  Service: Cardiovascular;  Laterality: Left;   POLYPECTOMY  11/08/2017   Procedure: POLYPECTOMY;  Surgeon: Milus Banister, MD;  [facility];  Service: Endoscopy;;   PROCTOSCOPY N/A 05/22/2018   Procedure: RIGID PROCTOSCOPY;  Surgeon: Michael Boston, MD;  [facility];  Service: General;  Laterality: N/A;   RIGHT HEART CATH N/A 02/13/2018   Procedure: RIGHT HEART CATH;  Surgeon: [name], MD;  Location: [place] [facility];  Service: Cardiovascular;  Laterality: N/A;   RIGHT HEART CATH N/A  06/23/2020   Procedure: RIGHT HEART CATH;  Surgeon: [name], MD;  Location: [place] [facility];  Service: Cardiovascular;  Laterality: N/A;   RIGHT/LEFT HEART CATH AND CORONARY ANGIOGRAPHY N/A 12/11/2019   Procedure: RIGHT/LEFT HEART CATH AND CORONARY ANGIOGRAPHY;  Surgeon: [name], MD;  Location: [place] [facility];  Service: Cardiovascular;  Laterality: N/A;   SUBMUCOSAL TATTOO INJECTION  03/02/2020   Procedure: SUBMUCOSAL TATTOO INJECTION;  Surgeon: Yetta Flock, MD;  Location: [facility] ENDOSCOPY;  Service: Gastroenterology;;   SUBMUCOSAL TATTOO INJECTION  05/31/2020   Procedure: SUBMUCOSAL TATTOO INJECTION;  Surgeon: Irene Shipper, MD;  Location: [place];  Service: Endoscopy;;   TEE WITHOUT CARDIOVERSION N/A 08/02/2021   Procedure: TRANSESOPHAGEAL ECHOCARDIOGRAM (TEE);  Surgeon: [name], MD;  Location: [facility] ENDOSCOPY;  Service: Cardiovascular;  Laterality: N/A;   TUBAL LIGATION     XI ROBOTIC ASSISTED LOWER ANTERIOR RESECTION N/A 05/22/2018   Procedure: XI ROBOTIC ASSISTED SIGMOID COLOECTOMY MOBILIZATION OF SPLENIC FLEXURE, FIREFLY ASSESSMENT OF PERFUSION;  Surgeon: Michael Boston, MD;  [facility];  Service: General;  Laterality: N/A;  [place]  History:  reports that she has never smoked. She has never used smokeless tobacco. She reports that she does not currently use alcohol. She reports that she does not currently use drugs after having used the following drugs: Marijuana.  No Known Allergies  Family History  Problem Relation Age of Onset   Asthma Father    Heart attack Father    Asthma Sister    Lung cancer Sister    Heart attack Mother    Stroke Brother    Colon cancer Neg Hx    Esophageal cancer Neg Hx    Pancreatic cancer Neg Hx    Stomach cancer Neg Hx    Liver disease Neg Hx     Prior to Admission medications   Medication Sig Start Date End Date Taking? Authorizing Provider  amiodarone (PACERONE) 200 MG tablet Take 1  tablet (200 mg total) by mouth daily. 11/10/21  Yes Saguier, Percell Miller, PA-C  apixaban (ELIQUIS) 2.5 MG TABS tablet Take 1 tablet (2.5 mg total) by mouth 2 (two) times daily. 03/11/22  Yes Pokhrel, Laxman, MD  benzonatate (TESSALON) 100 MG capsule Take 1 capsule (100 mg total) by mouth 3 (three) times daily as needed for cough. 03/20/22  Yes Saguier, Percell Miller, PA-C  diazepam (VALIUM) 5 MG tablet 1 tab po bid prn anxiety Patient taking differently: Take 5 mg by mouth 2 (two) times daily as needed for anxiety. 01/19/22  Yes Saguier, Percell Miller, PA-C  doxycycline (VIBRA-TABS) 100 MG tablet Take 1 tablet (100 mg total) by mouth 2 (two) times daily. 03/20/22  Yes Saguier, Percell Miller, PA-C  gabapentin (NEURONTIN) 300 MG capsule Take 1 capsule (300 mg total) by mouth daily. Fill one rx. Computer may have sent over 2 Patient taking differently: Take 300 mg by mouth daily as needed (pain). 01/10/22  Yes Saguier, Percell Miller, PA-C  HYDROcodone bit-homatropine (HYCODAN) 5-1.5 MG/5ML syrup Take 5 mLs by mouth every 8 (eight) hours as needed for cough. 03/24/22  Yes Saguier, Percell Miller, PA-C  hydrOXYzine (ATARAX) 10 MG tablet 1 tab po q hs prn itching Patient taking differently: Take 10 mg by mouth at bedtime as needed for itching. 02/07/22  Yes Saguier, Percell Miller, PA-C  ipratropium (ATROVENT HFA) 17 MCG/ACT inhaler Inhale 2 puffs into the lungs every 6 (six) hours as needed for wheezing. 03/24/22  Yes Saguier, Percell Miller, PA-C  levothyroxine (SYNTHROID) 125 MCG tablet Take 1 tablet (125 mcg total) by mouth daily before breakfast. 01/16/22  Yes Saguier, Percell Miller, PA-C  pantoprazole (PROTONIX) 40 MG tablet Take 1 tablet (40 mg total) by mouth 2 (two) times daily before a meal. 03/09/22  Yes Pokhrel, Laxman, MD  cyclobenzaprine (FLEXERIL) 5 MG tablet Take 5 mg by mouth at bedtime as needed for muscle spasms. 12/07/21   [provider]  lidocaine-prilocaine (EMLA) cream Apply 1 Application topically 3 (three) times a week. 09/08/21   [provider]  methylPREDNISolone (MEDROL) 4 MG tablet 3 tab po day 1, 2 tab po day 2 and 1 tab po day 3 03/20/22   Saguier, Percell Miller, PA-C  nitroGLYCERIN (NITROSTAT) 0.4 MG SL tablet Place 1 tablet (0.4 mg total) under the tongue every 5 (five) minutes as needed for chest pain. 10/27/21   Thurnell Lose, MD  rOPINIRole (REQUIP) 0.25 MG tablet Take 0.25 mg by mouth at bedtime. 12/08/21   [provider]  traZODone (DESYREL) 50 MG tablet Take 0.5-1 tablets (25-50 mg total) by mouth at bedtime as needed for sleep. 12/16/20   Saguier, Percell Miller, PA-C  Shirley Friar  5 % SOLN Place 1 drop into both eyes at bedtime. 11/23/21   [provider]    Physical Exam: Vitals:   03/28/22 0930 03/28/22 1044 03/28/22 1123 03/28/22 1650  BP: (!) 157/80  (!) 161/73 (!) 154/69  Pulse: 78 90 80 79  Resp: '19  18 17  '$ Temp: 98.4 F (36.9 C)  98.4 F (36.9 C) 98.1 F (36.7 C)  TempSrc: Oral  Oral Oral  SpO2: 96% 99% (!) 87% 96%  Weight: 55.3 kg     Height: '4\' 11"'$  (1.499 m)      General:  Appears calm and comfortable and is in NAD; periodic dry cough Eyes:  EOMI, normal lids, iris ENT:  grossly normal hearing, lips & tongue, mmm Neck:  no LAD, masses or thyromegaly Cardiovascular:  RRR, no m/r/g. No LE edema.  Respiratory:   Scattered rhonchi, some wheezing, good air movement.  Normal respiratory effort. Abdomen:  soft, NT, ND Skin:  no rash or induration seen on limited exam Musculoskeletal:  grossly normal tone BUE/BLE, good ROM, no bony abnormality Psychiatric:  grossly normal mood and affect, speech fluent and appropriate, AOx3 Neurologic:  CN 2-12 grossly intact, moves all extremities in coordinated fashion   Radiological Exams on Admission: Independently reviewed - see discussion in A/P where applicable  DG Chest 2 View  Result Date: 03/27/2022 CLINICAL DATA:  Shortness of breath and cough for 1 week EXAM: CHEST - 2 VIEW COMPARISON:  03/24/2022 FINDINGS: AICD noted. Stable moderate  enlargement of the cardiopericardial silhouette. Atherosclerotic calcification of the aortic arch. Perihilar interstitial accentuation noted without definite Kerley B lines, potentially from atypical pneumonia or mild interstitial edema. No pleural effusion. IMPRESSION: 1. Stable moderate enlargement of the cardiopericardial silhouette. 2. Perihilar interstitial accentuation noted without definite Kerley B lines, potentially from atypical pneumonia or mild interstitial edema. 3. AICD noted. Electronically Signed   By: Van Clines M.D.   On: 03/27/2022 17:39    EKG: Independently reviewed.  AV paced with rate 79; NSCSLT   Labs on Admission: I have personally reviewed the available labs and imaging studies at the time of the admission.  Pertinent labs:    BUN 14/Creatinine 4.71/GFR 9 BNP 1685 HS troponin 55, 54 WBC 5.9 Hgb 9.9 - stable COVID/flu negative   Assessment and Plan: Principal Problem:   Dyspnea Active Problems:   Acquired hypothyroidism   Biventricular ICD (implantable cardioverter-defibrillator) in place   Cough   Persistent atrial fibrillation (HCC)   ESRD on dialysis (HCC)   Chronic combined systolic (congestive) and diastolic (congestive) heart failure (HCC)   Glaucoma   HYPERTENSION, BENIGN   Prolonged QT interval    Cough, dypsnea -Patient with persistent cough and dyspnea, not improved despite abx, steroids, cough suppressants -Ddx is broad and includes infectious/bronchitis (will check RVP), asthma (unusual to develop acutely), GERD/silent aspiration (will continue PPI BID and start H2 blocker), volume overload from CHF/ESRD (unlikely given reported HD compliance), amiodarone toxicity, ILD -Will observe on med tele -Pulm consult -Nebs ordered, although will need to be cautious given prolonged QT  ESRD on HD -Patient on chronic MWF HD -Nephrology prn order set utilized -She does not appear to be volume overloaded or otherwise in need of acute  HD -Nephrology is aware that patient will need HD   Chronic combined CHF -Prior h/o both systolic and diastolic CHF but recent Echos show normalization of the EF -Volume controlled with HD -Does not appear grossly volume overloaded at this time -Has AICD  Afib -  Rate controlled with amiodarone; will continue for now, unless pulm has concerns about amio toxicity -Continue Eliquis  HTN -She does not appear to be taking medications for this issue at this time   Hypothyroidism -Continue Synthroid  Glaucoma -Continue Xiidra  OSA -Not on CPAP  Prolonged QTc -Will attempt to avoid QT-prolonging medications where possible    Advance Care Planning:   Code Status: Full Code   Consults: Pulmonology  DVT Prophylaxis: Eliquis  Family Communication: None present; she is capable of communicating with family at this time  Severity of Illness: The appropriate patient status for this patient is OBSERVATION. Observation status is judged to be reasonable and necessary in order to provide the required intensity of service to ensure the patient's safety. The patient's presenting symptoms, physical exam findings, and initial radiographic and laboratory data in the context of their medical condition is felt to place them at decreased risk for further clinical deterioration. Furthermore, it is anticipated that the patient will be medically stable for discharge from the hospital within 2 midnights of admission.   Author: Karmen Bongo, MD 03/28/2022 5:00 PM  For on call review [email or web address].

## 2022-03-28 NOTE — ED Notes (Signed)
Pt reports that she has three medications she would like to take while she is waiting for a hospital bed, with the soonest admin being synthroid. Will speak to ED provider for orders.

## 2022-03-28 NOTE — ED Notes (Signed)
Called for transport talked to george at 7:54

## 2022-03-29 ENCOUNTER — Other Ambulatory Visit (HOSPITAL_COMMUNITY): Payer: Self-pay

## 2022-03-29 DIAGNOSIS — R053 Chronic cough: Secondary | ICD-10-CM

## 2022-03-29 DIAGNOSIS — E039 Hypothyroidism, unspecified: Secondary | ICD-10-CM

## 2022-03-29 DIAGNOSIS — Z95810 Presence of automatic (implantable) cardiac defibrillator: Secondary | ICD-10-CM

## 2022-03-29 DIAGNOSIS — N186 End stage renal disease: Secondary | ICD-10-CM | POA: Diagnosis not present

## 2022-03-29 DIAGNOSIS — Z992 Dependence on renal dialysis: Secondary | ICD-10-CM

## 2022-03-29 LAB — C-REACTIVE PROTEIN: CRP: 1.4 mg/dL — ABNORMAL HIGH (ref <1.0)

## 2022-03-29 LAB — SEDIMENTATION RATE: Sed Rate: 20 mm/hr (ref 0–22)

## 2022-03-29 LAB — GLUCOSE, CAPILLARY: Glucose-Capillary: 92 mg/dL (ref 70–99)

## 2022-03-29 LAB — HEPATITIS B SURFACE ANTIBODY, QUANTITATIVE: Hep B S AB Quant (Post): 44.4 m[IU]/mL (ref >9.9)

## 2022-03-29 MED ORDER — ALBUTEROL SULFATE HFA 108 (90 BASE) MCG/ACT IN AERS
2.0000 | INHALATION_SPRAY | Freq: Four times a day (QID) | RESPIRATORY_TRACT | 0 refills | Status: DC | PRN
  Filled 2022-03-29: qty 6.7, 30d supply, fill #0

## 2022-03-29 MED ORDER — FLUTICASONE PROPIONATE 50 MCG/ACT NA SUSP
1.0000 | Freq: Every day | NASAL | 0 refills | Status: DC
  Filled 2022-03-29: qty 16, 60d supply, fill #0

## 2022-03-29 MED ORDER — FLUTICASONE-SALMETEROL 250-50 MCG/ACT IN AEPB
1.0000 | INHALATION_SPRAY | Freq: Two times a day (BID) | RESPIRATORY_TRACT | 1 refills | Status: DC
  Filled 2022-03-29: qty 60, 30d supply, fill #0

## 2022-03-29 NOTE — Discharge Summary (Addendum)
PATIENT DETAILS Name: Morgan Clay Age: 76 y.o. Sex: female Date of Birth: June 24, 1945 MRN: [id number]. Admitting Physician: Karmen Bongo, MD WIO:[id number], [name]  Admit Date: 03/27/2022 Discharge date: 03/29/2022  Recommendations for Outpatient Follow-up:  Follow up with PCP in 1-2 weeks Please obtain CMP/CBC in one week Please ensure follow-up with pulmonology.  Admitted From:  Home  Disposition: Home   Discharge Condition: fair  CODE STATUS:   Code Status: Full Code   Diet recommendation:  Diet Order             Diet - low sodium heart healthy           Diet renal with fluid restriction Fluid restriction: 1200 mL Fluid; Room service appropriate? Yes; Fluid consistency: Thin  Diet effective now                    Brief Summary: 76 year old-with history of ESRD on HD-A-fib on Eliquis, chronic HFpEF-s/p AICD placement, , hepatitis C related cirrhosis, HTN-who was evaluated for shortness of breath/cough for several weeks.  Brief Hospital Course: Shortness of breath/chronic cough-likely multifactorial due to bronchospasm in the setting of possible asthma exacerbation, GERD Evaluated by PCCM-started on Yupelri/Brovana/budesonide nebulizers with some improvement overnight.   Requesting discharge today-she feels somewhat better.  I did not hear her coughing in the room at all.  She is comfortably on room air.  On exam-some scattered rhonchi but overall clear to auscultation. Will place her on inhaler regimen-including inhaled steroids and have her follow-up with Dr. Melvyn Novas as outlined by [facility]. Note-recent COVID/influenza PCR were negative.  ESRD on HD MWF Nephrology followed during this hospitalization Resume usual outpatient dialysis regimen  Chronic combined systolic/diastolic heart failure Euvolemic Volume removal with HD  PAF Continue amiodarone-CXR without any infiltrates-not thought to have ILD at this point. Continue  Eliquis.  Hypothyroidism Continue Synthroid   Discharge Diagnoses:  Principal Problem:   Dyspnea Active Problems:   Acquired hypothyroidism   Biventricular ICD (implantable cardioverter-defibrillator) in place   Cough   Persistent atrial fibrillation (HCC)   ESRD on dialysis (HCC)   Chronic combined systolic (congestive) and diastolic (congestive) heart failure (HCC)   Glaucoma   HYPERTENSION, BENIGN   Prolonged QT interval   Discharge Instructions:  Activity:  As tolerated   Discharge Instructions     Diet - low sodium heart healthy   Complete by: As directed    Discharge instructions   Complete by: As directed    Follow with Primary MD  Saguier, Percell Miller, PA-C in 1-2 weeks  Please get a complete blood count and chemistry panel checked by your Primary MD at your next visit, and again as instructed by your Primary MD.  Get Medicines reviewed and adjusted: Please take all your medications with you for your next visit with your Primary MD  Laboratory/radiological data: Please request your Primary MD to go over all hospital tests and procedure/radiological results at the follow up, please ask your Primary MD to get all Hospital records sent to his/her office.  In some cases, they will be blood work, cultures and biopsy results pending at the time of your discharge. Please request that your primary care M.D. follows up on these results.  Also Note the following: If you experience worsening of your admission symptoms, develop shortness of breath, life threatening emergency, suicidal or homicidal thoughts you must seek medical attention immediately by calling 911 or calling your MD immediately  if symptoms less severe.  You must read complete  instructions/literature along with all the possible adverse reactions/side effects for all the Medicines you take and that have been prescribed to you. Take any new Medicines after you have completely understood and accpet all the possible  adverse reactions/side effects.   Do not drive when taking Pain medications or sleeping medications (Benzodaizepines)  Do not take more than prescribed Pain, Sleep and Anxiety Medications. It is not advisable to combine anxiety,sleep and pain medications without talking with your primary care practitioner  Special Instructions: If you have smoked or chewed Tobacco  in the last 2 yrs please stop smoking, stop any regular Alcohol  and or any Recreational drug use.  Wear Seat belts while driving.  Please note: You were cared for by a hospitalist during your hospital stay. Once you are discharged, your primary care physician will handle any further medical issues. Please note that NO REFILLS for any discharge medications will be authorized once you are discharged, as it is imperative that you return to your primary care physician (or establish a relationship with a primary care physician if you do not have one) for your post hospital discharge needs so that they can reassess your need for medications and monitor your lab values.   Increase activity slowly   Complete by: As directed       Allergies as of 03/29/2022   No Known Allergies      Medication List     TAKE these medications    albuterol 108 (90 Base) MCG/ACT inhaler Commonly known as: VENTOLIN HFA Inhale 2 puffs into the lungs every 6 (six) hours as needed for wheezing or shortness of breath.   amiodarone 200 MG tablet Commonly known as: PACERONE Take 1 tablet (200 mg total) by mouth daily.   apixaban 2.5 MG Tabs tablet Commonly known as: Eliquis Take 1 tablet (2.5 mg total) by mouth 2 (two) times daily.   Atrovent HFA 17 MCG/ACT inhaler Generic drug: ipratropium Inhale 2 puffs into the lungs every 6 (six) hours as needed for wheezing.   benzonatate 100 MG capsule Commonly known as: TESSALON Take 1 capsule (100 mg total) by mouth 3 (three) times daily as needed for cough.   cyclobenzaprine 5 MG tablet Commonly  known as: FLEXERIL Take 5 mg by mouth at bedtime as needed for muscle spasms.   diazepam 5 MG tablet Commonly known as: Valium 1 tab po bid prn anxiety What changed:  how much to take how to take this when to take this reasons to take this additional instructions   fluticasone 50 MCG/ACT nasal spray Commonly known as: FLONASE Place 1 spray into both nostrils daily.   fluticasone-salmeterol 250-50 MCG/ACT Aepb Commonly known as: ADVAIR Inhale 1 puff into the lungs in the morning and at bedtime.   gabapentin 300 MG capsule Commonly known as: NEURONTIN Take 1 capsule (300 mg total) by mouth daily. Fill one rx. Computer may have sent over 2 What changed:  when to take this reasons to take this additional instructions   HYDROcodone bit-homatropine 5-1.5 MG/5ML syrup Commonly known as: HYCODAN Take 5 mLs by mouth every 8 (eight) hours as needed for cough.   hydrOXYzine 10 MG tablet Commonly known as: ATARAX 1 tab po q hs prn itching What changed:  how much to take how to take this when to take this reasons to take this additional instructions   levothyroxine 125 MCG tablet Commonly known as: Synthroid Take 1 tablet (125 mcg total) by mouth daily before breakfast.   lidocaine-prilocaine  cream Commonly known as: EMLA Apply 1 Application topically 3 (three) times a week.   nitroGLYCERIN 0.4 MG SL tablet Commonly known as: NITROSTAT Place 1 tablet (0.4 mg total) under the tongue every 5 (five) minutes as needed for chest pain.   pantoprazole 40 MG tablet Commonly known as: Protonix Take 1 tablet (40 mg total) by mouth 2 (two) times daily before a meal.   rOPINIRole 0.25 MG tablet Commonly known as: REQUIP Take 0.25 mg by mouth at bedtime.   traZODone 50 MG tablet Commonly known as: DESYREL Take 0.5-1 tablets (25-50 mg total) by mouth at bedtime as needed for sleep.   Xiidra 5 % Soln Generic drug: Lifitegrast Place 1 drop into both eyes at bedtime.         Follow-up Information     Saguier, [name]. Schedule an appointment as soon as possible for a visit in 1 week(s).   Specialties: Internal Medicine, Family Medicine Contact information: [address] [place] [address] [phone]         Tanda Rockers, MD Follow up.   Specialty: Pulmonary Disease Why: Hospital follow up, Office will call with date/time, If you dont hear from them,please give them a call Contact information: [address] [place] [address] [phone]         Hemodialysis center Follow up.   Why: Follow at your usual schedule.               No Known Allergies   Other Procedures/Studies: DG Chest 2 View  Result Date: 03/27/2022 CLINICAL DATA:  Shortness of breath and cough for 1 week EXAM: CHEST - 2 VIEW COMPARISON:  03/24/2022 FINDINGS: AICD noted. Stable moderate enlargement of the cardiopericardial silhouette. Atherosclerotic calcification of the aortic arch. Perihilar interstitial accentuation noted without definite Kerley B lines, potentially from atypical pneumonia or mild interstitial edema. No pleural effusion. IMPRESSION: 1. Stable moderate enlargement of the cardiopericardial silhouette. 2. Perihilar interstitial accentuation noted without definite Kerley B lines, potentially from atypical pneumonia or mild interstitial edema. 3. AICD noted. Electronically Signed   By: Van Clines M.D.   On: 03/27/2022 17:39   DG Chest 2 View  Result Date: 03/24/2022 CLINICAL DATA:  Short of breath and cough EXAM: CHEST - 2 VIEW COMPARISON:  03/20/2022 FINDINGS: LEFT-sided pacer overlies mildly enlarged cardiac silhouette. There is fine interstitial and airspace pattern in the lungs not changed from prior. No focal infiltrate. No pleural fluid. No pneumothorax. IMPRESSION: 1. No interval change. 2. Cardiomegaly and mild interstitial/pulmonary edema pattern. Electronically Signed   By: Suzy Bouchard M.D.    On: 03/24/2022 18:01   DG Chest 2 View  Result Date: 03/20/2022 CLINICAL DATA:  Productive cough x 2 days with some SOB. ? Covid Positive HX: AFIB, HTN with diet controlled diabetes EXAM: CHEST - 2 VIEW COMPARISON:  03/06/2022 FINDINGS: Mild prominent perihilar interstitial and alveolar edema or infiltrates, appears marginally increased since previous. Stable cardiomegaly. Stable left subclavian AICD. Aortic Atherosclerosis (ICD10-170.0). No effusion. Visualized bones unremarkable. IMPRESSION: 1. Slight increase in perihilar edema or infiltrates. 2. Stable cardiomegaly. Electronically Signed   By: Lucrezia Europe M.D.   On: 03/20/2022 12:34   CUP PACEART REMOTE DEVICE CHECK  Result Date: 03/07/2022 Scheduled remote reviewed. Normal device function.  optivol crossed threshold 10/15 and is ongoing Battery estimated 6moNext remote 11/30 [place]  DG Chest 2 View  Result Date: 03/06/2022 CLINICAL DATA:  Shortness of breath. History of  asthma, hypertension and CHF. EXAM: CHEST - 2 VIEW COMPARISON:  12/29/2021 FINDINGS: Multilead left-sided pacemaker. Mild cardiomegaly. Peribronchial, septal and interstitial thickening suspicious for pulmonary edema. Subsegmental atelectasis or scar at the right lung base. There may be trace pleural effusions. No pneumothorax. On limited assessment, no acute osseous findings. IMPRESSION: Mild cardiomegaly. Peribronchial, septal and interstitial thickening suspicious for pulmonary edema. Possible trace pleural effusions. Electronically Signed   By: Keith Rake M.D.   On: 03/06/2022 16:51   US Abdomen Limited RUQ (LIVER/GB)  Result Date: 02/28/2022 CLINICAL DATA:  Cirrhosis EXAM: ULTRASOUND ABDOMEN LIMITED RIGHT UPPER QUADRANT COMPARISON:  CT abdomen pelvis dated 10/24/2021 and abdominal ultrasound dated 09/06/2021. FINDINGS: Gallbladder: No gallstones or wall thickening visualized. No sonographic Murphy sign noted by sonographer. Common bile duct: Diameter: 4 mm Liver: No  focal lesion identified. The liver demonstrates a coarsened echotexture and a nodular surface contour, consistent with cirrhosis. Within normal limits in parenchymal echogenicity. Portal vein is patent on color Doppler imaging with normal direction of blood flow towards the liver. Other: None. IMPRESSION: Cirrhosis. No focal liver lesions. Electronically Signed   By: Zerita Boers M.D.   On: 02/28/2022 15:21     TODAY-DAY OF DISCHARGE:  Subjective:   Morgan Clay today has no headache,no chest abdominal pain,no new weakness tingling or numbness, feels much better wants to go home today.   Objective:   Blood pressure (!) 134/99, pulse 66, temperature 97.8 F (36.6 C), temperature source Oral, resp. rate 18, height '4\' 11"'$  (1.499 m), weight 52.8 kg, SpO2 100 %.  Intake/Output Summary (Last 24 hours) at 03/29/2022 1058 Last data filed at 03/29/2022 0800 Gross per 24 hour  Intake 180 ml  Output 2000 ml  Net -1820 ml   Filed Weights   03/28/22 2256 03/29/22 0240 03/29/22 0354  Weight: 55.3 kg 50.8 kg 52.8 kg    Exam: Awake Alert, Oriented *3, No new F.N deficits, Normal affect [place].AT,PERRAL Supple Neck,No JVD, No cervical lymphadenopathy appriciated.  Symmetrical Chest wall movement, Good air movement bilaterally, CTAB RRR,No Gallops,Rubs or new Murmurs, No Parasternal Heave +ve B.Sounds, Abd Soft, Non tender, No organomegaly appriciated, No rebound -guarding or rigidity. No Cyanosis, Clubbing or edema, No new Rash or bruise   PERTINENT RADIOLOGIC STUDIES: DG Chest 2 View  Result Date: 03/27/2022 CLINICAL DATA:  Shortness of breath and cough for 1 week EXAM: CHEST - 2 VIEW COMPARISON:  03/24/2022 FINDINGS: AICD noted. Stable moderate enlargement of the cardiopericardial silhouette. Atherosclerotic calcification of the aortic arch. Perihilar interstitial accentuation noted without definite Kerley B lines, potentially from atypical pneumonia or mild interstitial edema. No pleural  effusion. IMPRESSION: 1. Stable moderate enlargement of the cardiopericardial silhouette. 2. Perihilar interstitial accentuation noted without definite Kerley B lines, potentially from atypical pneumonia or mild interstitial edema. 3. AICD noted. Electronically Signed   By: Van Clines M.D.   On: 03/27/2022 17:39     PERTINENT LAB RESULTS: CBC: Recent Labs    03/27/22 1650  WBC 5.9  HGB 9.9*  HCT 31.7*  PLT 217   CMET CMP     Component Value Date/Time   NA 138 03/27/2022 1650   NA 140 12/25/2018 1514   K 3.4 (L) 03/27/2022 1650   CL 94 (L) 03/27/2022 1650   CO2 31 03/27/2022 1650   GLUCOSE 97 03/27/2022 1650   BUN 14 03/27/2022 1650   BUN 33 (H) 12/25/2018 1514   CREATININE 4.71 (H) 03/27/2022 1650   CREATININE 2.24 (H) 02/04/2020 1416  CALCIUM 8.2 (L) 03/27/2022 1650   PROT 8.7 (H) 03/27/2022 1650   PROT 8.3 07/19/2017 1503   ALBUMIN 3.8 03/27/2022 1650   ALBUMIN 4.9 (H) 07/19/2017 1503   AST 21 03/27/2022 1650   AST 14 (L) 11/23/2017 1132   ALT 11 03/27/2022 1650   ALT 9 11/23/2017 1132   ALKPHOS 79 03/27/2022 1650   BILITOT 0.9 03/27/2022 1650   BILITOT 0.3 11/23/2017 1132   GFRNONAA 9 (L) 03/27/2022 1650   GFRNONAA 21 (L) 02/04/2020 1416   GFRAA 25 (L) 02/04/2020 1416    GFR Estimated Creatinine Clearance: 7.8 mL/min (A) (by C-G formula based on SCr of 4.71 mg/dL (H)). No results for input(s): "LIPASE", "AMYLASE" in the last 72 hours. No results for input(s): "CKTOTAL", "CKMB", "CKMBINDEX", "TROPONINI" in the last 72 hours. Invalid input(s): "POCBNP" No results for input(s): "DDIMER" in the last 72 hours. No results for input(s): "HGBA1C" in the last 72 hours. No results for input(s): "CHOL", "HDL", "LDLCALC", "TRIG", "CHOLHDL", "LDLDIRECT" in the last 72 hours. No results for input(s): "TSH", "T4TOTAL", "T3FREE", "THYROIDAB" in the last 72 hours.  Invalid input(s): "FREET3" No results for input(s): "VITAMINB12", "FOLATE", "FERRITIN", "TIBC", "IRON",  "RETICCTPCT" in the last 72 hours. Coags: No results for input(s): "INR" in the last 72 hours.  Invalid input(s): "PT" Microbiology: Recent Results (from the past 240 hour(s))  Resp Panel by RT-PCR (Flu A&B, Covid) Anterior Nasal Swab     Status: None   Collection Time: 03/27/22  5:33 PM   Specimen: Anterior Nasal Swab  Result Value Ref Range Status   SARS Coronavirus 2 by RT PCR NEGATIVE NEGATIVE Final    Comment: (NOTE) SARS-CoV-2 target nucleic acids are NOT DETECTED.  The SARS-CoV-2 RNA is generally detectable in upper respiratory specimens during the acute phase of infection. The lowest concentration of SARS-CoV-2 viral copies this assay can detect is 138 copies/mL. A negative result does not preclude SARS-Cov-2 infection and should not be used as the sole basis for treatment or other patient management decisions. A negative result may occur with  improper specimen collection/handling, submission of specimen other than nasopharyngeal swab, presence of viral mutation(s) within the areas targeted by this assay, and inadequate number of viral copies(<138 copies/mL). A negative result must be combined with clinical observations, patient history, and epidemiological information. The expected result is Negative.  Fact Sheet for Patients:  EntrepreneurPulse.com.au  Fact Sheet for Healthcare Providers:  IncredibleEmployment.be  This test is no t yet approved or cleared by the [place] FDA and  has been authorized for detection and/or diagnosis of SARS-CoV-2 by FDA under an Emergency Use Authorization (EUA). This EUA will remain  in effect (meaning this test can be used) for the duration of the COVID-19 declaration under Section 564(b)(1) of the Act, 21 U.S.C.section 360bbb-3(b)(1), unless the authorization is terminated  or revoked sooner.       Influenza A by PCR NEGATIVE NEGATIVE Final   Influenza B by PCR NEGATIVE NEGATIVE Final     Comment: (NOTE) The Xpert Xpress SARS-CoV-2/FLU/RSV plus assay is intended as an aid in the diagnosis of influenza from Nasopharyngeal swab specimens and should not be used as a sole basis for treatment. Nasal washings and aspirates are unacceptable for Xpert Xpress SARS-CoV-2/FLU/RSV testing.  Fact Sheet for Patients: EntrepreneurPulse.com.au  Fact Sheet for Healthcare Providers: IncredibleEmployment.be  This test is not yet approved or cleared by the [place] FDA and has been authorized for detection and/or diagnosis of SARS-CoV-2 by FDA under an  Emergency Use Authorization (EUA). This EUA will remain in effect (meaning this test can be used) for the duration of the COVID-19 declaration under Section 564(b)(1) of the Act, 21 U.S.C. section 360bbb-3(b)(1), unless the authorization is terminated or revoked.  Performed at [facility], [address] [place], [place] [address]     FURTHER DISCHARGE INSTRUCTIONS:  Get Medicines reviewed and adjusted: Please take all your medications with you for your next visit with your Primary MD  Laboratory/radiological data: Please request your Primary MD to go over all hospital tests and procedure/radiological results at the follow up, please ask your Primary MD to get all Hospital records sent to his/her office.  In some cases, they will be blood work, cultures and biopsy results pending at the time of your discharge. Please request that your primary care M.D. goes through all the records of your hospital data and follows up on these results.  Also Note the following: If you experience worsening of your admission symptoms, develop shortness of breath, life threatening emergency, suicidal or homicidal thoughts you must seek medical attention immediately by calling 911 or calling your MD immediately  if symptoms less severe.  You must read complete instructions/literature along with all the  possible adverse reactions/side effects for all the Medicines you take and that have been prescribed to you. Take any new Medicines after you have completely understood and accpet all the possible adverse reactions/side effects.   Do not drive when taking Pain medications or sleeping medications (Benzodaizepines)  Do not take more than prescribed Pain, Sleep and Anxiety Medications. It is not advisable to combine anxiety,sleep and pain medications without talking with your primary care practitioner  Special Instructions: If you have smoked or chewed Tobacco  in the last 2 yrs please stop smoking, stop any regular Alcohol  and or any Recreational drug use.  Wear Seat belts while driving.  Please note: You were cared for by a hospitalist during your hospital stay. Once you are discharged, your primary care physician will handle any further medical issues. Please note that NO REFILLS for any discharge medications will be authorized once you are discharged, as it is imperative that you return to your primary care physician (or establish a relationship with a primary care physician if you do not have one) for your post hospital discharge needs so that they can reassess your need for medications and monitor your lab values.  Total Time spent coordinating discharge including counseling, education and face to face time equals less than 30 minutes.  SignedOren Binet 03/29/2022 10:58 AM

## 2022-03-29 NOTE — Progress Notes (Signed)
  [facility] Progress Note   Subjective:  Seen in room - still coughing. Dialyzed overnight - 2L off, went fine. Pulm consulted and started on different inhaler regimen with close outpatient f/u. For discharge today.  Objective Vitals:   03/29/22 0240 03/29/22 0339 03/29/22 0354 03/29/22 0907  BP: (!) 178/70 (!) 134/99    Pulse: 70 73  66  Resp: '20 18  18  '$ Temp: 97.8 F (36.6 C) 97.8 F (36.6 C)    TempSrc: Oral Oral    SpO2: 98% 92%  100%  Weight: 50.8 kg  52.8 kg   Height:       Physical Exam General: Well appearing, NAD. Room air Heart: RRR; no murmur Lungs: Rhonchi/wheezing throughout Extremities: no LE edema Dialysis Access: L AVF + thrill  Additional Objective Labs: Basic Metabolic Panel: Recent Labs  Lab 03/27/22 1650  NA 138  K 3.4*  CL 94*  CO2 31  GLUCOSE 97  BUN 14  CREATININE 4.71*  CALCIUM 8.2*   Liver Function Tests: Recent Labs  Lab 03/27/22 1650  AST 21  ALT 11  ALKPHOS 79  BILITOT 0.9  PROT 8.7*  ALBUMIN 3.8   CBC: Recent Labs  Lab 03/27/22 1650  WBC 5.9  NEUTROABS 3.8  HGB 9.9*  HCT 31.7*  MCV 94.6  PLT 217   Studies/Results: DG Chest 2 View  Result Date: 03/27/2022 CLINICAL DATA:  Shortness of breath and cough for 1 week EXAM: CHEST - 2 VIEW COMPARISON:  03/24/2022 FINDINGS: AICD noted. Stable moderate enlargement of the cardiopericardial silhouette. Atherosclerotic calcification of the aortic arch. Perihilar interstitial accentuation noted without definite Kerley B lines, potentially from atypical pneumonia or mild interstitial edema. No pleural effusion. IMPRESSION: 1. Stable moderate enlargement of the cardiopericardial silhouette. 2. Perihilar interstitial accentuation noted without definite Kerley B lines, potentially from atypical pneumonia or mild interstitial edema. 3. AICD noted. Electronically Signed   By: Van Clines M.D.   On: 03/27/2022 17:39    Medications:   amiodarone  200 mg Oral Daily    apixaban  2.5 mg Oral BID   arformoterol  15 mcg Nebulization BID   budesonide (PULMICORT) nebulizer solution  0.5 mg Nebulization BID   famotidine  20 mg Oral BID   levothyroxine  125 mcg Oral QAC breakfast   pantoprazole  40 mg Oral BID AC   revefenacin  175 mcg Nebulization Daily   sodium chloride flush  3 mL Intravenous Q12H    Dialysis Orders: MWF at AF 3:30hr, 400/600, EDW  56.2kg, 2K/2Ca, UFP #2, AVF, no heparin - Mircera 122mg IV q 2 weeks - Hectoral 370m IV q HD - Sensipar '30mg'$  PO q HD   Assessment/Plan:  Productive cough/reactive airway disease: Diffuse rhonchi/wheezing - appreciate pulm consult with new inhaler regimen and close outpatient f/u.  ESRD: Continue HD this week per Holiday schedule - next HD Friday as outpatient.  Hypertension/volume: BP better - now below prior EDW - lower on d/c.  Anemia: Hgb 9.9 - follow.  Metabolic bone disease: Ca ok, continue home meds/binders.  T2DM  HFrEF with AICD  Hx recurrent GIB: Continue PPI A-fib/flutter: On Eliquis  KaPollyann Kennedy1/22/2023, 9:57 AM  [employer]

## 2022-03-29 NOTE — Telephone Encounter (Signed)
Patient is scheduled for HFU on 05/16/2022 at 10:30am with Dr. Melvyn Novas. Reminder mailed to address on file. Nothing further needed.

## 2022-03-29 NOTE — Progress Notes (Signed)
   03/29/22 0240  Vitals  Temp 97.8 F (36.6 C)  Temp Source Oral  BP (!) 178/70  Pulse Rate 70  ECG Heart Rate 71  Resp 20  Post Treatment  Dialyzer Clearance Lightly streaked  Duration of HD Treatment -hour(s) 3.5 hour(s)  Liters Processed 84.6  Fluid Removed (mL) 2000 mL  Tolerated HD Treatment Yes  AVG/AVF Arterial Site Held (minutes) 10 minutes  AVG/AVF Venous Site Held (minutes) 10 minutes   TX fin. W/o difficulty, bit of a bleeder when comes to the closing DSG.

## 2022-04-01 ENCOUNTER — Telehealth: Payer: Self-pay | Admitting: Physician Assistant

## 2022-04-01 NOTE — Telephone Encounter (Signed)
Transition of care contact from inpatient facility  Date of Discharge: 03/29/22 Date of Contact: 04/01/22 Method of contact: Phone  Attempted to contact patient to discuss transition of care from inpatient admission. Patient did not answer the phone. Message was left on the patient's voicemail with call back instructions.  Anice Paganini, PA-C 04/01/2022, 10:36 AM  [name]

## 2022-04-03 ENCOUNTER — Telehealth: Payer: Self-pay

## 2022-04-03 NOTE — Telephone Encounter (Signed)
Transition Care Management Unsuccessful Follow-up Telephone Call  Date of discharge and from where:  [facility] 03/29/2022  Attempts:  1st Attempt  Reason for unsuccessful TCM follow-up call:  Left voice message Juanda Crumble, [place] Direct Dial [phone]

## 2022-04-04 NOTE — Telephone Encounter (Signed)
Transition Care Management Unsuccessful Follow-up Telephone Call  Date of discharge and from where:  03/29/2022, [facility]  Attempts:  2nd Attempt  Reason for unsuccessful TCM follow-up call:  Left voice message

## 2022-04-05 NOTE — Telephone Encounter (Signed)
Transition Care Management Unsuccessful Follow-up Telephone Call  Date of discharge and from where:  03/29/2022, [facility]  Attempts:  3rd Attempt  Reason for unsuccessful TCM follow-up call:  Left voice message  Patient has HFU with PCP tomorrow, 04/06/22.

## 2022-04-06 ENCOUNTER — Ambulatory Visit (INDEPENDENT_AMBULATORY_CARE_PROVIDER_SITE_OTHER): Payer: Medicare Other | Admitting: Medical

## 2022-04-06 ENCOUNTER — Encounter: Payer: Self-pay | Admitting: Medical

## 2022-04-06 ENCOUNTER — Ambulatory Visit (INDEPENDENT_AMBULATORY_CARE_PROVIDER_SITE_OTHER): Payer: Medicare Other

## 2022-04-06 ENCOUNTER — Other Ambulatory Visit (HOSPITAL_BASED_OUTPATIENT_CLINIC_OR_DEPARTMENT_OTHER): Payer: Self-pay

## 2022-04-06 VITALS — BP 129/42 | HR 78 | Temp 98.4°F | Resp 18 | Ht 59.0 in | Wt 126.0 lb

## 2022-04-06 DIAGNOSIS — J4520 Mild intermittent asthma, uncomplicated: Secondary | ICD-10-CM

## 2022-04-06 DIAGNOSIS — N186 End stage renal disease: Secondary | ICD-10-CM | POA: Diagnosis not present

## 2022-04-06 DIAGNOSIS — R051 Acute cough: Secondary | ICD-10-CM

## 2022-04-06 DIAGNOSIS — I428 Other cardiomyopathies: Secondary | ICD-10-CM

## 2022-04-06 DIAGNOSIS — D649 Anemia, unspecified: Secondary | ICD-10-CM | POA: Diagnosis not present

## 2022-04-06 LAB — CUP PACEART REMOTE DEVICE CHECK
Battery Remaining Longevity: 2 mo
Battery Voltage: 2.75 V
Brady Statistic AP VP Percent: 0.65 %
Brady Statistic AP VS Percent: 0.01 %
Brady Statistic AS VP Percent: 98.93 %
Brady Statistic AS VS Percent: 0.4 %
Brady Statistic RA Percent Paced: 0.66 %
Brady Statistic RV Percent Paced: 99.34 %
Date Time Interrogation Session: 20231130043724
HighPow Impedance: 42 Ohm
HighPow Impedance: 70 Ohm
Implantable Lead Connection Status: 753985
Implantable Lead Connection Status: 753985
Implantable Lead Connection Status: 753985
Implantable Lead Implant Date: 20080818
Implantable Lead Implant Date: 20080818
Implantable Lead Implant Date: 20080818
Implantable Lead Location: 753858
Implantable Lead Location: 753859
Implantable Lead Location: 753860
Implantable Lead Model: 4193
Implantable Lead Model: 5076
Implantable Lead Model: 6947
Implantable Pulse Generator Implant Date: 20170531
Lead Channel Impedance Value: 266 Ohm
Lead Channel Impedance Value: 304 Ohm
Lead Channel Impedance Value: 4047 Ohm
Lead Channel Impedance Value: 4047 Ohm
Lead Channel Impedance Value: 437 Ohm
Lead Channel Impedance Value: 456 Ohm
Lead Channel Pacing Threshold Amplitude: 0.875 V
Lead Channel Pacing Threshold Amplitude: 1.25 V
Lead Channel Pacing Threshold Amplitude: 1.375 V
Lead Channel Pacing Threshold Pulse Width: 0.4 ms
Lead Channel Pacing Threshold Pulse Width: 0.4 ms
Lead Channel Pacing Threshold Pulse Width: 0.4 ms
Lead Channel Sensing Intrinsic Amplitude: 10.125 mV
Lead Channel Sensing Intrinsic Amplitude: 10.125 mV
Lead Channel Sensing Intrinsic Amplitude: 3.5 mV
Lead Channel Sensing Intrinsic Amplitude: 3.5 mV
Lead Channel Setting Pacing Amplitude: 2 V
Lead Channel Setting Pacing Amplitude: 2 V
Lead Channel Setting Pacing Amplitude: 2.5 V
Lead Channel Setting Pacing Pulse Width: 0.4 ms
Lead Channel Setting Pacing Pulse Width: 0.4 ms
Lead Channel Setting Sensing Sensitivity: 0.3 mV
Zone Setting Status: 755011

## 2022-04-06 MED ORDER — FLUTICASONE PROPIONATE 50 MCG/ACT NA SUSP
2.0000 | Freq: Every day | NASAL | 2 refills | Status: DC
  Filled 2022-04-06: qty 16, fill #0

## 2022-04-06 NOTE — Progress Notes (Signed)
Subjective:    Patient ID: Morgan Clay, female    DOB: 05-21-1945, 76 y.o.   MRN: [id number]  HPI  Pt in for follow up post hospitalization. She states still has cough but not having severe shortness of breath.   Below is the Discharge summary.  Admit Date: 03/27/2022 Discharge date: 03/29/2022   Recommendations for Outpatient Follow-up:  Follow up with PCP in 1-2 weeks Please obtain CMP/CBC in one week Please ensure follow-up with pulmonology.   Admitted From:  Home   Disposition: Home   Discharge Condition: fair   CODE STATUS:   Code Status: Full Code    Diet recommendation:  Diet Order    Brief Summary: 76 year old-with history of ESRD on HD-A-fib on Eliquis, chronic HFpEF-s/p AICD placement, , hepatitis C related cirrhosis, HTN-who was evaluated for shortness of breath/cough for several weeks.   Brief Hospital Course: Shortness of breath/chronic cough-likely multifactorial due to bronchospasm in the setting of possible asthma exacerbation, GERD Evaluated by PCCM-started on Yupelri/Brovana/budesonide nebulizers with some improvement overnight.   Requesting discharge today-she feels somewhat better.  I did not hear her coughing in the room at all.  She is comfortably on room air.  On exam-some scattered rhonchi but overall clear to auscultation. Will place her on inhaler regimen-including inhaled steroids and have her follow-up with Dr. Melvyn Novas as outlined by [facility]. Note-recent COVID/influenza PCR were negative.   ESRD on HD [facility] Nephrology followed during this hospitalization Resume usual outpatient dialysis regimen   Chronic combined systolic/diastolic heart failure Euvolemic Volume removal with HD   PAF Continue amiodarone-CXR without any infiltrates-not thought to have ILD at this point. Continue Eliquis.   Hypothyroidism Continue Synthroid     Discharge Diagnoses:  Principal Problem:   Dyspnea Active Problems:   Acquired hypothyroidism    Biventricular ICD (implantable cardioverter-defibrillator) in place   Cough   Persistent atrial fibrillation (HCC)   ESRD on dialysis (HCC)   Chronic combined systolic (congestive) and diastolic (congestive) heart failure (HCC)   Glaucoma   HYPERTENSION, BENIGN   Prolonged QT interval     Pt was given flonase/salmeterol  inhaler, flonase nasal spray and albuterol. Describes feeling better. No heart burn.  Still  had benzonatate available.   Pt states recently can sleep lying flat. She states before admitted was having to use various pillows to prop up.   Appointment with pulmonologist May 16, 2021.       Review of Systems  Constitutional:  Negative for chills, fatigue and fever.  HENT:  Negative for congestion.   Respiratory:  Positive for cough. Negative for choking, chest tightness and wheezing.        Much less cough now.  Cardiovascular:  Negative for chest pain and palpitations.  Gastrointestinal:  Negative for abdominal pain.  Musculoskeletal:  Negative for back pain and joint swelling.  Neurological:  Negative for dizziness and headaches.  Hematological:  Negative for adenopathy. Does not bruise/bleed easily.  Psychiatric/Behavioral:  Negative for behavioral problems.        Objective:   Physical Exam  General- No acute distress. Pleasant patient. Neck- Full range of motion, no jvd Lungs- Clear, even and unlabored. Heart- regular rate and rhythm. Neurologic- CNII- XII grossly intact.   Lower ext- no swelling and no pedal edema.      Assessment & Plan:   Patient Instructions  You do appear stable stable presently with O2 sat of 97% and lungs clear.  Your cough and shortness of breath seem to be  multifactorial but think mostly was likely reactive airways/asthma based on your response to inhaler treatment.  Continue Advair inhaler, Flonase and albuterol.  Placing future labs seeking get metabolic panel and CBC tomorrow.  Lab was closed when you came in for  your appointment a little bit late.  I tried to refill your Advair and albuterol that was prescribed In the Hospital but getting QT prolongation warnings and was not able to override that through our computer system.  We will going to try to contact a pulmonologist who appeared to sign off/okay your follow-up visit with Dr. Melvyn Novas.  Going to see if he would send in refills of the prescriptions as you will likely run out prior to follow-up with pulmonologist.  Follow-up with me 2 weeks after pulmonologist visit or sooner if signs symptoms worsen or change.    Mackie Pai, PA-C   Time spent with patient today was  45 minutes which consisted of chart revdew, discussing diagnosis, work up treatment and documentation. Also discussed epic hard stop with pharmacist regarding attempt to refill advair and albuterol. Then send pulmonologist message to see if they want to continue and if they can write the rx.

## 2022-04-06 NOTE — Telephone Encounter (Signed)
Dr. Silas Flood,  Wanted your opinion on mutual patient.  She was admitted due to cough, shortness of breath and some hypoxia.  She was discharged on Advair and albuterol inhaler and is doing well with much less cough and 02 sat of 97%.  I see a brief note that she signed off on in epic and she has appointment with Dr. Melvyn Novas for follow-up.  She still has some added albuterol left but will likely run out before pulmonologist appointment.  When I tried to give patient refills through epic there is a hard stop warning regarding QT prolongation that I am not able to override.  She was being followed closely in the hospital.  Do you want her to continue these 2 inhalers?  If so could you send in prescription.  From my and I cannot override epic?  Or would you want her to just use Atrovent when she runs out of Symbicort and albuterol?  Thanks for your help.  Or do you have a number that I can call you to discuss.   Mackie Pai, PA-C

## 2022-04-06 NOTE — Patient Instructions (Signed)
You do appear stable stable presently with O2 sat of 97% and lungs clear.  Your cough and shortness of breath seem to be multifactorial but think mostly was likely reactive airways/asthma based on your response to inhaler treatment.  Continue Advair inhaler, Flonase and albuterol.  Placing future labs seeking get metabolic panel and CBC tomorrow.  Lab was closed when you came in for your appointment a little bit late.  I tried to refill your Advair and albuterol that was prescribed In the Hospital but getting QT prolongation warnings and was not able to override that through our computer system.  We will going to try to contact a pulmonologist who appeared to sign off/okay your follow-up visit with Dr. Melvyn Novas.  Going to see if he would send in refills of the prescriptions as you will likely run out prior to follow-up with pulmonologist.  Follow-up with me 2 weeks after pulmonologist visit or sooner if signs symptoms worsen or change.

## 2022-04-07 ENCOUNTER — Telehealth: Payer: Self-pay | Admitting: *Deleted

## 2022-04-07 ENCOUNTER — Other Ambulatory Visit (INDEPENDENT_AMBULATORY_CARE_PROVIDER_SITE_OTHER): Payer: Medicare Other

## 2022-04-07 DIAGNOSIS — N186 End stage renal disease: Secondary | ICD-10-CM | POA: Diagnosis not present

## 2022-04-07 DIAGNOSIS — D649 Anemia, unspecified: Secondary | ICD-10-CM

## 2022-04-07 LAB — COMPREHENSIVE METABOLIC PANEL
ALT: 11 U/L (ref 0–35)
AST: 18 U/L (ref 0–37)
Albumin: 4.4 g/dL (ref 3.5–5.2)
Alkaline Phosphatase: 79 U/L (ref 39–117)
BUN: 29 mg/dL — ABNORMAL HIGH (ref 6–23)
CO2: 34 mEq/L — ABNORMAL HIGH (ref 19–32)
Calcium: 9.5 mg/dL (ref 8.4–10.5)
Chloride: 93 mEq/L — ABNORMAL LOW (ref 96–112)
Creatinine, Ser: 6.38 mg/dL (ref 0.40–1.20)
GFR: 6.02 mL/min — CL (ref >60.00)
Glucose, Bld: 101 mg/dL — ABNORMAL HIGH (ref 70–99)
Potassium: 4.4 mEq/L (ref 3.5–5.1)
Sodium: 140 mEq/L (ref 135–145)
Total Bilirubin: 0.7 mg/dL (ref 0.2–1.2)
Total Protein: 8 g/dL (ref 6.0–8.3)

## 2022-04-07 LAB — CBC WITH DIFFERENTIAL/PLATELET
Basophils Absolute: 0 10*3/uL (ref 0.0–0.1)
Basophils Relative: 0.8 % (ref 0.0–3.0)
Eosinophils Absolute: 0.2 10*3/uL (ref 0.0–0.7)
Eosinophils Relative: 4 % (ref 0.0–5.0)
HCT: 35.4 % — ABNORMAL LOW (ref 36.0–46.0)
Hemoglobin: 11.1 g/dL — ABNORMAL LOW (ref 12.0–15.0)
Lymphocytes Relative: 24.3 % (ref 12.0–46.0)
Lymphs Abs: 1.3 10*3/uL (ref 0.7–4.0)
MCHC: 31.5 g/dL (ref 30.0–36.0)
MCV: 93.2 fl (ref 78.0–100.0)
Monocytes Absolute: 0.8 10*3/uL (ref 0.1–1.0)
Monocytes Relative: 14.1 % — ABNORMAL HIGH (ref 3.0–12.0)
Neutro Abs: 3.1 10*3/uL (ref 1.4–7.7)
Neutrophils Relative %: 56.8 % (ref 43.0–77.0)
Platelets: 175 10*3/uL (ref 150.0–400.0)
RBC: 3.79 Mil/uL — ABNORMAL LOW (ref 3.87–5.11)
RDW: 18.4 % — ABNORMAL HIGH (ref 11.5–15.5)
WBC: 5.5 10*3/uL (ref 4.0–10.5)

## 2022-04-07 MED ORDER — ALBUTEROL SULFATE HFA 108 (90 BASE) MCG/ACT IN AERS: 2.0000 | INHALATION_SPRAY | Freq: Four times a day (QID) | RESPIRATORY_TRACT | 6 refills | Status: DC | PRN

## 2022-04-07 NOTE — Telephone Encounter (Signed)
CRITICAL VALUE STICKER  CRITICAL VALUE: GFR -- 6.02,  Creatinine -- 6.38  RECEIVER (on-site recipient of call): Kelle Darting, [place] NOTIFIED: 04/07/22 @ 2:37pm  MESSENGER (representative from lab): Saa  MD NOTIFIED: PA, [place]  TIME OF NOTIFICATION: 2:38pm  RESPONSE:

## 2022-04-07 NOTE — Telephone Encounter (Signed)
Looks like her Advair should last until late January as she has 1 refill. She sees Dr. Melvyn Novas January 9. I sent the albuterol refill. You can over ride by selecting "keep" in the bottom of the pop up.

## 2022-04-08 NOTE — Progress Notes (Signed)
Remote ICD transmission.   

## 2022-04-10 NOTE — Progress Notes (Signed)
PCP: Dr. Colin Benton Cardiology: Dr. Caryl Comes HF Cardiology: Dr. [name]  76 y.o. with history of nonischemic cardiomyopathy with recovery of EF with BiV pacing, recurrent GI bleeding, and paroxysmal atrial flutter was referred by Dr. Caryl Comes for evaluation of CHF.  Patient has a history of presumed nonischemic cardiomyopathy for a number of years.  Echo in 12/15 showed EF 25-30% and Cardiolite at that time showed no ischemia.  She had Medtronic CRT-D system placed, and EF recovered. In 4/19, echo showed EF 60-65%.    She has additionally had problems with GI bleeding.  She had been on Eliquis for atrial flutter but this was stopped with recurrent GI bleeding.  She had bleeding from a gastric ulcer but was also noted to have multiple small bowel AVMs on capsule endoscopy out of reach of endoscope.  Eliquis was stopped. She has had no melena or BRBPR off Eliquis.   Echo in 10/19 showed EF 55-60%, mild-moderate MR, moderate AI, moderate RV dilation.   She was admitted in 1/20 with diverticulitis and ended up having a sigmoid colectomy.    Echo in 9/20 showed EF lower at 45-50%, moderate AI.   She had LHC/RHC in 8/21 with nonobstructive CAD, normal filling pressures, and preserved cardiac output. Echo 8/21 showed EF 50-55%, mild LVH, mildly decreased RV systolic function, moderate aortic insufficiency.  Back in in A fib the end of October. Amio was increased and DC-CV was set up for November 19th.  Chemically converted so DC-CV canceled.    Seen in HF Clinic in 10/21 and was more fatigued. Found to be in atrial flutter but hgb also 6.9. She was on Eliquis at the time. Loaded on amio and converted to NSR. Patient underwent small bowel enteroscopy 03/02/2020 with findings trace esophageal varix, small superficial AVMs prepyloric stomach with other punctate vascular lesions treated with APC, benign duodenal polyp.  Etiology likely secondary to jejunal AVM versus early GAVE vs portal gastropathy.Patient  underwent small bowel enteroscopy 03/02/2020 with findings trace esophageal varix, small superficial AVMs prepyloric stomach with other punctate vascular lesions treated with APC, benign duodenal polyp.  Etiology likely secondary to jejunal AVM versus early GAVE vs portal gastropathy. Eliquis restarted.    Hgb dropped back down to 7.7 in 12/21. Saw [name] and capsule placed but got stuck in the stomach.    Had volume overload on device in 05/24/20 and torsemide increased by Dr. [name]    She was admitted on 05/27/20 with dizziness, and a fall preceded by melena found to have further GI bleeding hgb of 5.1 and an AKI.  Eliquis was held and she was transfused 2u PRBC's.  Repeat pill endoscope showed bleeding in distal small bowel.  Colonoscopy found no bleeding but large avm in cecum argon lasered.  She has been off anticoagulants.   Admitted 06/23/20 from [place] with markedly elevated filling pressures. Aggressively diuresed but had worsening renal function and progressed to ESRD. Nephrology consulted. She tolerated HD well and is on MWF schedule.  Echo in 2/22 showed EF 55-60%, low normal RV systolic function.   Saw EP, turned down for Watchman due to comorbidities.   Echo in 2/23 showed EF 55-60%, moderate LVH, moderate RV enlargement with mildly decreased RV systolic function, moderate TR, ?moderate-severe AI.   Underwent TEE only (3/23) as she spontaneously converted to NSR, showing mild to moderate MR, mod to severe AI. Unable to get MRI due to device.    In 5/23, she had DCCV to NSR (was in  atrial tachycardia).  She had recurrence of atrial tachycardia after 5/23 DCCV.  This was transient.  She was seen by Dr. Curt Bears for evaluation for atrial tachycardia, but he thought that she was too high risk for ablation.   In 6/23, she developed a diverticular bleed, Eliquis was stopped transiently.   Today she returns for HF follow up. She is in NSR today.  No further BRBPR/melena.  Weight down 5 lbs.   She is still short of breath after walking 20-30 feet.  She remains very easily fatigued.  No chest pain.    ECG (personally reviewed): NSR, BiV paced  Medtronic device interrogation: >99% BiV pacing, thoracic impedance low x months.   Labs (9/19):  Hgb 10.2, K 3.8, creatinine 2.5 Labs (10/19): K 3.4, creatinine 2.2, WBC 76.  Labs (2/20): K 3.9, creatinine 1.8 Labs (8/20): K 3.8, creatinine 1.98 Labs (3/21): LDL 33 Labs (5/21): K 3.6, creatinine 1.69 Labs (7/21): K 4, creatinine 1.94 Labs (9/21): K 4.5, creatinine 2.24, TSH normal, HFTs normal  Labs (11/21): K 4.2 Creatinine 3.05  Labs (1/22): K 4.0, creatinine 2.22 Labs (2/22): K 4.1, creatinine 2.54 Labs (8/22): TSH 6.06 Labs (9/22): LFTs normal, hgb 10.5.  Labs (7/23): BNP 2020, hgb 11, LFTs normal  PMH: 1. Diabetes: diet-controlled.  2. Gout 3. HCV: Treated with Harvoni.  4. Atrial flutter/atrial tachycardia: Atypical.  Had DCCV in 3/19, maintained on amiodarone.  - DCCV in 5/23 5. HTN 6. ESRD: MWF HD 7. H/o SBO 8. Obesity 9. Nonischemic cardiomyopathy: Echo in 12/15 with EF 25-30%.  Cardiolite in 12/15 with no ischemia.  She had a Medtronic CRT-D device placed with recovery of EF.   - Echo (4/19): EF 60-65% with moderate AI, moderate MR, and PASP 49 mmHg.  - RHC 02/13/2018 RA mean 6, RV 49/7,PA 50/18, mean 31,PCWP mean 20, Oxygen saturations:PA 67% AO 97%, Cardiac Output (Fick) 4.6, Cardiac Index (Fick) 2.9, PVR 2.4 WU - Echo (10/19): EF 55-60%, mild-moderate MR, moderate AI, moderate RV dilation, PASP 60 mmHg.  - PYP scan (3/20): H/CL 1.3, grade 1-2 visually.  Unlikely transthyretin cardiac amyloidosis.  - Echo (9/20): EF 45-50%, moderate aortic insufficiency.  - LHC/RHC (8/21): 40% pLAD, 50% D1; mean RA 3, PA 41/16, mean PCWP 14, CI 2.74, PVR 2.99.  - Echo (8/21): EF 50-55%, mild LVH, grade 2 diastolic dysfunction, normal RV size with mildly decreased systolic function, moderate AI, no AS - PYP scan (1/22): H/CL 1.1,  grade 1-2 => probably negative for TTR cardiac amyloidosis.  - Echo 07/2020  EF 55-60%, moderate LVH, D-shaped interventricular septum, RV moderately dilated with low normal function, severe biatrial enlargement, severe TR.   - RHC 06/2020:  RA mean 21 RV 49/20 PA 59/24, mean 39 PCWP mean 26 Cardiac Output (Fick) 5.07 Cardiac Index (Fick) 3.23 - Echo (2/23): EF 55-60%, moderate LVH, moderate RV enlargement with mildly decreased RV systolic function, moderate TR, ?moderate-severe AI.  PVR 2.6 WU Cardiac Output (Thermo) 4.72 Cardiac Index (Thermo) 3.01 PVR 2.75 WU PAPI 1.7.  10. GI bleeding: Gastric AVM and also gastric ulcer seen in past.  - 8/19 capsule endoscopy showed small bowel AVMs beyond the reach of endoscopy.  - With recurrent episodes of GI bleeding, Eliquis was stopped.  - Capsule study 1/22 showed bleeding in distal small bowel.  Colonoscopy found no bleeding but large avm in cecum argon lasered.  - TEE (3/23): EF 60-65%, moderate LVH, RV ok, moderate to severe MR, moderate to severe AI with PHT 413  msec and vena contracta 0.49 cm (moderate AI by measurements). 11. Fe deficiency anemia.  12. Depression. 13. OSA: Uses CPAP.  14. Diverticulitis: Sigmoid colectomy in 1/20.   ROS: All systems reviewed and negative except as per HPI.   Social History   Socioeconomic History   Marital status: Divorced    Spouse name: Not on file   Number of children: 1   Years of education: 12   Highest education level: Not on file  Occupational History   Not on file  Tobacco Use   Smoking status: Never   Smokeless tobacco: Never  Vaping Use   Vaping Use: Never used  Substance and Sexual Activity   Alcohol use: Not Currently    Comment: 01/03/2018 "couple drinks/month"   Drug use: Not Currently    Types: Marijuana    Comment: VERY INTERMITTENT    Sexual activity: Not Currently  Other Topics Concern   Not on file  Social History Narrative   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       Home Situation: lives alone      Spiritual Beliefs: Christian - Baptist      Lifestyle: no regular exercise; poor diet      She is divorced at least one adult child   Never smoker    rare alcohol to occasional at best    no substance abuse         Social Determinants of Health   Financial Resource Strain: Low Risk  (07/15/2021)   Overall Financial Resource Strain (CARDIA)    Difficulty of Paying Living Expenses: Not hard at all  Food Insecurity: No Food Insecurity (07/15/2021)   Hunger Vital Sign    Worried About Running Out of Food in the Last Year: Never true    [place] in the Last Year: Never true  Transportation Needs: No Transportation Needs (07/15/2021)   PRAPARE - Hydrologist (Medical): No    Lack of Transportation (Non-Medical): No  Physical Activity: Not on file  Stress: No Stress Concern Present (07/15/2021)   [place]    Feeling of Stress : Not at all  Social Connections: Moderately Isolated (07/15/2021)   Social Connection and Isolation Panel [NHANES]    Frequency of Communication with Friends and Family: More than three times a week    Frequency of Social Gatherings with Friends and Family: Three times a week    Attends Religious Services: More than 4 times per year    Active Member of Clubs or Organizations: No    Attends Archivist Meetings: Never    Marital Status: Divorced  Human resources officer Violence: Not At Risk (07/15/2021)   Humiliation, Afraid, Rape, and Kick questionnaire    Fear of Current or Ex-Partner: No    Emotionally Abused: No    Physically Abused: No    Sexually Abused: No   Family History  Problem Relation Age of Onset   Asthma Father    Heart attack Father    Asthma Sister    Lung cancer Sister    Heart attack Mother    Stroke Brother    Colon cancer Neg Hx    Esophageal cancer Neg Hx    Pancreatic cancer Neg Hx    Stomach  cancer Neg Hx    Liver disease Neg Hx    Current Outpatient Medications  Medication Sig Dispense Refill   albuterol (VENTOLIN HFA) 108 (  90 Base) MCG/ACT inhaler Inhale 2 puffs into the lungs every 6 (six) hours as needed for wheezing or shortness of breath. 8 g 6   amiodarone (PACERONE) 200 MG tablet Take 1 tablet (200 mg total) by mouth daily. 90 tablet 3   apixaban (ELIQUIS) 2.5 MG TABS tablet Take 1 tablet (2.5 mg total) by mouth 2 (two) times daily. 60 tablet 11   benzonatate (TESSALON) 100 MG capsule Take 1 capsule (100 mg total) by mouth 3 (three) times daily as needed for cough. 30 capsule 0   cyclobenzaprine (FLEXERIL) 5 MG tablet Take 5 mg by mouth at bedtime as needed for muscle spasms.     diazepam (VALIUM) 5 MG tablet 1 tab po bid prn anxiety (Patient taking differently: Take 5 mg by mouth 2 (two) times daily as needed for anxiety.) 10 tablet 0   fluticasone (FLONASE) 50 MCG/ACT nasal spray Place 2 sprays into both nostrils daily. 16 g 2   fluticasone-salmeterol (ADVAIR) 250-50 MCG/ACT AEPB Inhale 1 puff into the lungs in the morning and at bedtime. 60 each 1   gabapentin (NEURONTIN) 300 MG capsule Take 1 capsule (300 mg total) by mouth daily. Fill one rx. Computer may have sent over 2 (Patient taking differently: Take 300 mg by mouth daily as needed (pain).) 90 capsule 2   HYDROcodone bit-homatropine (HYCODAN) 5-1.5 MG/5ML syrup Take 5 mLs by mouth every 8 (eight) hours as needed for cough. 70 mL 0   hydrOXYzine (ATARAX) 10 MG tablet 1 tab po q hs prn itching (Patient taking differently: Take 10 mg by mouth at bedtime as needed for itching.) 10 tablet 0   ipratropium (ATROVENT HFA) 17 MCG/ACT inhaler Inhale 2 puffs into the lungs every 6 (six) hours as needed for wheezing. 12.9 g 1   levothyroxine (SYNTHROID) 125 MCG tablet Take 1 tablet (125 mcg total) by mouth daily before breakfast. 30 tablet 11   lidocaine-prilocaine (EMLA) cream Apply 1 Application topically 3 (three) times a  week.     nitroGLYCERIN (NITROSTAT) 0.4 MG SL tablet Place 1 tablet (0.4 mg total) under the tongue every 5 (five) minutes as needed for chest pain. 25 tablet 0   pantoprazole (PROTONIX) 40 MG tablet Take 1 tablet (40 mg total) by mouth 2 (two) times daily before a meal. 30 tablet 2   rOPINIRole (REQUIP) 0.25 MG tablet Take 0.25 mg by mouth at bedtime.     traZODone (DESYREL) 50 MG tablet Take 0.5-1 tablets (25-50 mg total) by mouth at bedtime as needed for sleep. 30 tablet 3   XIIDRA 5 % SOLN Place 1 drop into both eyes at bedtime.     No current facility-administered medications for this visit.   There were no vitals taken for this visit.   Wt Readings from Last 3 Encounters:  04/06/22 57.2 kg (126 lb)  03/29/22 52.8 kg (116 lb 4.8 oz)  03/27/22 57.3 kg (126 lb 6.4 oz)   Physical Exam: General: NAD Neck: JVP 12-14, no thyromegaly or thyroid nodule.  Lungs: Clear to auscultation bilaterally with normal respiratory effort. CV: Nondisplaced PMI.  Heart regular S1/S2, no S3/S4, 2/6 HSM LLSB.  No peripheral edema.  No carotid bruit.  Normal pedal pulses.  Abdomen: Soft, nontender, no hepatosplenomegaly, no distention.  Skin: Intact without lesions or rashes.  Neurologic: Alert and oriented x 3.  Psych: Normal affect. Extremities: No clubbing or cyanosis.  HEENT: Normal.   Assessment/Plan: 1. NICM, recovered, now with primarily diastolic CHF:  Echo in 6/07  showed EF improved to 60-65% with MDT BiV pacing. Echo in 10/19 showed stable EF 55-60% with mild-moderate MR, moderate AI. PYP scan in 3/20 was equivocal for transthyretin amyloidosis, repeat PYP scan in 1/22 was probably negative for TTR cardiac amyloidosis.  Echo in 9/20 showed EF back down to 45-50%. LHC/RHC in 8/21 showed nonobstructive CAD, normal filling pressures and cardiac output. Last echo 2/23 showed EF 55-60%, moderate LVH, moderate RV enlargement with mildly decreased RV systolic function, moderate TR, ? moderate-severe AI.   TEE (3/23) EF 60-65%, moderate LVH, RV ok, moderate to severe MR, moderate to severe AI. Volume now managed by HD. NYHA class IIIb symptoms chronically. Volume overloaded on exam. Optivol > threshold x months.  - HD for volume management, would recommend lowering her dry weight.  - Off Coreg and Bidil with low BP at HD.  2. Atrial fibrillation/atrial flutter/atrial tachycardia: Paroxysmal.  She has had multiple GI bleeds, and she was deemed a poor Watchman candidate due to comorbidities by Dr. Quentin Ore.  Currently no evidence for overt bleeding.  Seen by Dr. Curt Bears, not thought to be candidate for atrial tachycardia ablation due to high risk.  She is in NSR today.  - Continue Eliquis 2.5 mg bid. CBC today.  - Continue amiodarone, check LFTs and TSH.  She needs regular eye exam.  3. ESRD: Now on HD M-W-F - Would recommend lower dry weight.  4. Anemia, chronic blood loss: Long history multiple bleeding events while on Eliquis and around 3 endoscopy/colonoscopies over the last 3 years. Sigmoid colectomy for recurrent diverticulitis. Eliquis has been stopped and started back in the past.  Cecal avm ablated most recently.  - CBC today.  5. Cirrhosis: S/p treatment with Harvoni for Hep C, followed by GI outpatient.     6. Aortic insufficiency: ? moderate-severe on last echo.  TEE 3/23 showed moderate to severe AI (PHT 413 msec and vena contracta 0.49 cm).  She would likely be a TAVR candidate. Cannot do cardiac MRI as ICD not compatible. - Repeat echo to reassess aortic insufficiency => consider TAVR if clearly severe.   Followup with APP 3 months.   [place]  04/10/2022

## 2022-04-11 ENCOUNTER — Encounter (HOSPITAL_COMMUNITY): Payer: Self-pay

## 2022-04-11 ENCOUNTER — Ambulatory Visit (HOSPITAL_COMMUNITY)
Admission: RE | Admit: 2022-04-11 | Discharge: 2022-04-11 | Disposition: A | Discharge status: Home / Self Care | Payer: Medicare Other | Source: Ambulatory Visit | Attending: Family Medicine | Admitting: Family Medicine

## 2022-04-11 VITALS — BP 146/62 | HR 74 | Wt 127.0 lb

## 2022-04-11 DIAGNOSIS — E1122 Type 2 diabetes mellitus with diabetic chronic kidney disease: Secondary | ICD-10-CM | POA: Insufficient documentation

## 2022-04-11 DIAGNOSIS — I480 Paroxysmal atrial fibrillation: Secondary | ICD-10-CM

## 2022-04-11 DIAGNOSIS — J441 Chronic obstructive pulmonary disease with (acute) exacerbation: Secondary | ICD-10-CM | POA: Diagnosis not present

## 2022-04-11 DIAGNOSIS — D649 Anemia, unspecified: Secondary | ICD-10-CM | POA: Diagnosis not present

## 2022-04-11 DIAGNOSIS — I5032 Chronic diastolic (congestive) heart failure: Secondary | ICD-10-CM | POA: Diagnosis present

## 2022-04-11 DIAGNOSIS — N186 End stage renal disease: Secondary | ICD-10-CM | POA: Diagnosis not present

## 2022-04-11 DIAGNOSIS — Z7951 Long term (current) use of inhaled steroids: Secondary | ICD-10-CM | POA: Insufficient documentation

## 2022-04-11 DIAGNOSIS — I351 Nonrheumatic aortic (valve) insufficiency: Secondary | ICD-10-CM

## 2022-04-11 DIAGNOSIS — Z95810 Presence of automatic (implantable) cardiac defibrillator: Secondary | ICD-10-CM | POA: Insufficient documentation

## 2022-04-11 DIAGNOSIS — K7460 Unspecified cirrhosis of liver: Secondary | ICD-10-CM

## 2022-04-11 DIAGNOSIS — I428 Other cardiomyopathies: Secondary | ICD-10-CM | POA: Diagnosis not present

## 2022-04-11 DIAGNOSIS — D500 Iron deficiency anemia secondary to blood loss (chronic): Secondary | ICD-10-CM | POA: Diagnosis not present

## 2022-04-11 DIAGNOSIS — Z79899 Other long term (current) drug therapy: Secondary | ICD-10-CM | POA: Diagnosis not present

## 2022-04-11 DIAGNOSIS — I2510 Atherosclerotic heart disease of native coronary artery without angina pectoris: Secondary | ICD-10-CM | POA: Diagnosis not present

## 2022-04-11 DIAGNOSIS — Z992 Dependence on renal dialysis: Secondary | ICD-10-CM | POA: Diagnosis not present

## 2022-04-11 DIAGNOSIS — I959 Hypotension, unspecified: Secondary | ICD-10-CM | POA: Insufficient documentation

## 2022-04-11 DIAGNOSIS — Z7901 Long term (current) use of anticoagulants: Secondary | ICD-10-CM | POA: Insufficient documentation

## 2022-04-11 DIAGNOSIS — I132 Hypertensive heart and chronic kidney disease with heart failure and with stage 5 chronic kidney disease, or end stage renal disease: Secondary | ICD-10-CM | POA: Insufficient documentation

## 2022-04-11 DIAGNOSIS — I4892 Unspecified atrial flutter: Secondary | ICD-10-CM | POA: Insufficient documentation

## 2022-04-11 DIAGNOSIS — I4719 Other supraventricular tachycardia: Secondary | ICD-10-CM | POA: Insufficient documentation

## 2022-04-11 DIAGNOSIS — R0602 Shortness of breath: Secondary | ICD-10-CM | POA: Diagnosis not present

## 2022-04-11 NOTE — Patient Instructions (Signed)
Thank you for coming in today  Your physician recommends that you schedule a follow-up appointment in:  3 months with Dr. Kendall Flack have been scheduled for December 26th 2023 at 72 am wit Dr. Cleda Mccreedy for EP appointment visit    Do the following things EVERYDAY: Weigh yourself in the morning before breakfast. Write it down and keep it in a log. Take your medicines as prescribed Eat low salt foods--Limit salt (sodium) to 2000 mg per day.  Stay as active as you can everyday Limit all fluids for the day to less than 2 liters  At the [facility], you and your health needs are our priority. As part of our continuing mission to provide you with exceptional heart care, we have created designated Provider Care Teams. These Care Teams include your primary Cardiologist (physician) and Advanced Practice Providers (APPs- Physician Assistants and Nurse Practitioners) who all work together to provide you with the care you need, when you need it.   You may see any of the following providers on your designated Care Team at your next follow up: Dr Glori Bickers Dr Loralie Champagne Dr. Roxana Hires, NP Lyda Jester, [place] [facility] [place], [place] Forestine Na, NP Audry Riles, PharmD   Please be sure to bring in all your medications bottles to every appointment.   If you have any questions or concerns before your next appointment please send [name] a message through [place] or call our office at [phone].    TO LEAVE A MESSAGE FOR THE NURSE SELECT OPTION 2, PLEASE LEAVE A MESSAGE INCLUDING: YOUR NAME DATE OF BIRTH CALL BACK NUMBER REASON FOR CALL**this is important as we prioritize the call backs  YOU WILL RECEIVE A CALL BACK THE SAME DAY AS LONG AS YOU CALL BEFORE 4:00 PM

## 2022-04-18 ENCOUNTER — Ambulatory Visit (INDEPENDENT_AMBULATORY_CARE_PROVIDER_SITE_OTHER): Payer: Medicare Other | Admitting: Podiatry

## 2022-04-18 VITALS — BP 156/72

## 2022-04-18 DIAGNOSIS — E119 Type 2 diabetes mellitus without complications: Secondary | ICD-10-CM

## 2022-04-18 DIAGNOSIS — M2142 Flat foot [pes planus] (acquired), left foot: Secondary | ICD-10-CM

## 2022-04-18 DIAGNOSIS — B351 Tinea unguium: Secondary | ICD-10-CM | POA: Diagnosis not present

## 2022-04-18 DIAGNOSIS — M2141 Flat foot [pes planus] (acquired), right foot: Secondary | ICD-10-CM

## 2022-04-18 DIAGNOSIS — M79674 Pain in right toe(s): Secondary | ICD-10-CM

## 2022-04-18 DIAGNOSIS — Z992 Dependence on renal dialysis: Secondary | ICD-10-CM | POA: Diagnosis not present

## 2022-04-18 DIAGNOSIS — M79675 Pain in left toe(s): Secondary | ICD-10-CM

## 2022-04-18 DIAGNOSIS — Q828 Other specified congenital malformations of skin: Secondary | ICD-10-CM

## 2022-04-18 DIAGNOSIS — N186 End stage renal disease: Secondary | ICD-10-CM | POA: Diagnosis not present

## 2022-04-18 DIAGNOSIS — E0822 Diabetes mellitus due to underlying condition with diabetic chronic kidney disease: Secondary | ICD-10-CM

## 2022-04-18 NOTE — Progress Notes (Unsigned)
ANNUAL DIABETIC FOOT EXAM  Subjective: Morgan Clay presents today for annual diabetic foot examination.Diabetes is controlled vis diet. She is on dialysis. Chief Complaint  Patient presents with   Nail Problem    RFC PCP-Clay, Morgan PCP VST-2 weeks ago   Patient confirms h/o diabetes and is on dialysis.  Patient has been diagnosed with neuropathy and it is managed with gabapentin.  Risk factors:  ESRD on dialysis, secondary hypercoagulable state, diabetes, diabetic neuropathy, HTN, COPD, CHF.  Clay, Morgan Pert is patient's PCP.   Past Medical History:  Diagnosis Date   Angiodysplasia of small intestine 06/10/2020   Ileum - seen on capsule endoscopy 05/2020 - ablated at colonoscopy   Aortic atherosclerosis ([place]) 08/30/2017   Arthritis    "qwhere" (01/03/2018)   Asthma    reports mild asthma since childhood - had COPD on dx list from prior PCP   Biventricular ICD (implantable cardioverter-defibrillator) in place 01/08/2007   Qualifier: Diagnosis of  By: Morgan Done FNP, Morgan     Bleeding pseudoaneurysm of left brachiocephalic arteriovenous fistula ([place]) 09/24/2020   Chronic obstructive pulmonary disease ([place])    Chronic systolic congestive heart failure ([place])    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 (HCV) ([place])    HEPATITIS C - s/p treatment with Harvoni, saw hepatology, Morgan Clay   Diverticulitis of left colon status post robotic low anterior to sigmoid resection 05/22/2018 07/31/2017   End stage renal disease ([place]) 03/22/2021   Glaucoma    Gout    History of blood transfusion ~ 11/2017   Hypertension associated with diabetes ([place]) 06/07/2017   Hypothyroidism    LBBB (left bundle branch block)    Malnutrition of moderate degree 07/09/2020   MDD (major depressive disorder), recurrent, in partial remission ([place]) 06/07/2017   OBSTRUCTIVE SLEEP APNEA 11/14/2007   no CPAP   OSTEOPENIA 09/30/2008   Persistent atrial fibrillation ([place]) 02/06/2020    Polyneuropathy associated with underlying disease ([place]) 06/09/2021   Pulmonary hypertension ([place]) on echocardiogram 01/14/2018   Secondary esophageal varices without bleeding ([place]) 12/23/2020   Formatting of this note might be different from the original. Patient had trace esophageal varices during small bowel enteroscopy in October 2021, follow-up EGD in January 2022 with no varices.  She previously had GI bleeding thought to be from AVM versus GAVE.  She is no longer on carvedilol and will need surveillance endoscopy.  Patient was previously followed at [facility] and I have asked her    Secondary hypercoagulable state ([place]) 02/06/2020   Type 2 diabetes, controlled, with renal manifestation ([place]) 11/24/2013   Patient Active Problem List   Diagnosis Date Noted   Dyspnea 03/27/2022   Prolonged QT interval 03/06/2022   Acute lower GI bleeding 10/25/2021   Transient hypotension 10/25/2021   Arthritis 09/06/2021   Chronic bronchitis ([place]) 09/06/2021   Chronic combined systolic (congestive) and diastolic (congestive) heart failure ([place]) 09/06/2021   History of blood transfusion 09/06/2021   Lower GI bleeding 09/06/2021   Carpal tunnel syndrome, bilateral 06/09/2021   Lesion of ulnar nerve 06/09/2021   Polyneuropathy associated with underlying disease ([place]) 06/09/2021   ESRD on dialysis ([place]) 03/22/2021   Esophageal varices without bleeding ([place]) 12/23/2020   Atrial fibrillation with RVR ([place]) 11/17/2020   Bleeding pseudoaneurysm of left brachiocephalic arteriovenous fistula ([place]) 09/24/2020   Bleeding pseudoaneurysm of left brachiocephalic AV fistula ([place]) 09/24/2020   Hepatic cirrhosis ([place]) 08/12/2020   Malnutrition of moderate degree 07/09/2020   Angiodysplasia of small intestine 06/10/2020  Acute blood loss anemia 05/27/2020   COVID-19 virus infection 05/27/2020   Anemia due to chronic blood loss 04/15/2020   Overweight (BMI 25.0-29.9) 03/02/2020   Chronic anticoagulation     Persistent atrial fibrillation ([place]) 02/06/2020   Secondary hypercoagulable state ([place]) 02/06/2020   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 (HCV) ([place])    Gout    Pain in right knee 05/13/2018   Anemia 01/18/2018   Pulmonary hypertension ([place]) on echocardiogram 01/14/2018   AVM (arteriovenous malformation) of small bowel, acquired 01/14/2018   Glaucoma 01/03/2018   Atrial fibrillation, chronic 10/25/2017   Aortic atherosclerosis ([place]) 08/30/2017   OBESITY 08/08/2017   Diverticulitis of left colon status post robotic low anterior to sigmoid resection 05/22/2018 07/31/2017   Hypertension associated with diabetes ([place]) 06/07/2017   MDD (major depressive disorder), recurrent, in partial remission ([place]) 06/07/2017   Asthma 05/22/2017   Chronic obstructive pulmonary disease (HCC)    Cough    AICD (automatic cardioverter/defibrillator) present 10/06/2015   Atypical atrial flutter ([place]) 09/28/2015   Type 2 diabetes, controlled, with renal manifestation ([place]) 11/24/2013   CKD (chronic kidney disease) stage 4, GFR 15-29 ml/min  11/24/2013   Nonischemic cardiomyopathy ([place]) 11/22/2010   DEPRESSION 12/17/2009   Obesity 05/27/2009   OSTEOPENIA 09/30/2008   FIBROIDS, UTERUS 03/05/2008   Chronic diastolic CHF (congestive heart failure) ([place]) 04/24/2007   HYPERTENSION, BENIGN 04/24/2007   Acquired hypothyroidism 01/28/2007   Biventricular ICD (implantable cardioverter-defibrillator) in place 01/08/2007   Past Surgical History:  Procedure Laterality Date   A/V FISTULAGRAM N/A 03/28/2021   Procedure: A/V FISTULAGRAM;  Surgeon: Morgan Sandy, MD;  Location: [facility];  Service: Cardiovascular;  Laterality: N/A;   APPENDECTOMY     AV FISTULA PLACEMENT Left 08/05/2020   Procedure: LEFT UPPER EXTREMITY ARTERIOVENOUS (AV) FISTULA CREATION;  Surgeon: Morgan Robins, MD;  Location: [place];  Service: Vascular;  Laterality: Left;   [place] Left  09/16/2020   Procedure: LEFT SECOND STAGE [place];  Surgeon: Morgan Robins, MD;  Location: [place];  Service: Vascular;  Laterality: Left;  PERIPHERAL NERVE BLOCK   CARDIAC CATHETERIZATION     CARDIOVERSION N/A 12/14/2014   Procedure: CARDIOVERSION;  Surgeon: Dorothy Spark, MD;  Location: [place];  Service: Cardiovascular;  Laterality: N/A;   CARDIOVERSION N/A 02/26/2017   Procedure: CARDIOVERSION;  Surgeon: Evans Lance, MD;  Location: [place] [facility];  Service: Cardiovascular;  Laterality: N/A;   CARDIOVERSION N/A 07/24/2017   Procedure: CARDIOVERSION;  Surgeon: Acie Fredrickson Wonda Cheng, MD;  Location: [facility] ENDOSCOPY;  Service: Cardiovascular;  Laterality: N/A;   CARDIOVERSION N/A 02/12/2020   Procedure: CARDIOVERSION;  Surgeon: [name], MD;  Location: [facility] ENDOSCOPY;  Service: Cardiovascular;  Laterality: N/A;   CARDIOVERSION N/A 09/15/2021   Procedure: CARDIOVERSION;  Surgeon: [name], MD;  Location[facility];  Service: Cardiovascular;  Laterality: N/A;   CARPAL TUNNEL RELEASE  07/21/2021   COLONOSCOPY N/A 11/08/2017   Procedure: COLONOSCOPY;  Surgeon: Milus Banister, MD;  Location: [facility]OSCOPY;  Service: Endoscopy;  Laterality: N/A;   COLONOSCOPY WITH PROPOFOL N/A 05/31/2020   Procedure: COLONOSCOPY WITH PROPOFOL;  Surgeon: Irene Shipper, MD;  Location: [facility] ENDOSCOPY;  Service: Endoscopy;  Laterality: N/A;   COLONOSCOPY WITH PROPOFOL N/A 10/26/2021   Procedure: COLONOSCOPY WITH PROPOFOL;  Surgeon: Sharyn Creamer, MD;  Location: [place];  Service: Gastroenterology;  Laterality: N/A;   DILATION AND CURETTAGE OF UTERUS  ENTEROSCOPY N/A 03/02/2020   Procedure: ENTEROSCOPY;  Surgeon: Yetta Flock, MD;  Location[facility] ENDOSCOPY;  Service: Gastroenterology;  Laterality: N/A;   EP IMPLANTABLE DEVICE N/A 10/06/2015   Procedure: BIV ICD Generator Changeout;  Surgeon: [name], MD;  Location: [place] [facility];  Service:  Cardiovascular;  Laterality: N/A;   ESOPHAGOGASTRODUODENOSCOPY N/A 10/26/2017   Procedure: ESOPHAGOGASTRODUODENOSCOPY (EGD);  Surgeon: Ladene Artist, MD;  Location: [facility];  Service: Endoscopy;  Laterality: N/A;   ESOPHAGOGASTRODUODENOSCOPY (EGD) WITH PROPOFOL N/A 11/07/2017   Procedure: ESOPHAGOGASTRODUODENOSCOPY (EGD) WITH PROPOFOL;  Surgeon: Milus Banister, MD;  [facility];  Service: Endoscopy;  Laterality: N/A;   ESOPHAGOGASTRODUODENOSCOPY (EGD) WITH PROPOFOL N/A 05/29/2020   Procedure: ESOPHAGOGASTRODUODENOSCOPY (EGD) WITH PROPOFOL;  Surgeon: Doran Stabler, MD;  Location: [place];  Service: Gastroenterology;  Laterality: N/A;   ESOPHAGOGASTRODUODENOSCOPY (EGD) WITH PROPOFOL N/A 10/26/2021   Procedure: ESOPHAGOGASTRODUODENOSCOPY (EGD) WITH PROPOFOL;  Surgeon: Sharyn Creamer, MD;  Location: [place];  Service: Gastroenterology;  Laterality: N/A;   ESOPHAGOGASTRODUODENOSCOPY (EGD) WITH PROPOFOL N/A 03/08/2022   Procedure: ESOPHAGOGASTRODUODENOSCOPY (EGD) WITH PROPOFOL;  Surgeon: Jackquline Denmark, MD;  Location: [place];  Service: Gastroenterology;  Laterality: N/A;   GIVENS CAPSULE STUDY N/A 05/19/2020   Procedure: GIVENS CAPSULE STUDY;  Surgeon: [name], MD;  Location: [facility];  Service: Endoscopy;  Laterality: N/A;  .adm for obs since pacemaker, PA wil enter order and see pt   GIVENS CAPSULE STUDY N/A 05/29/2020   Procedure: GIVENS CAPSULE STUDY;  Surgeon: Doran Stabler, MD;  Location: [place];  Service: Gastroenterology;  Laterality: N/A;   HEMATOMA EVACUATION Left 09/24/2020   Procedure: EVACUATION HEMATOMA ARM;  Surgeon: Rosetta Posner, MD;  Location: [facility] OR;  Service: Vascular;  Laterality: Left;   HOT HEMOSTASIS N/A 10/26/2017   Procedure: HOT HEMOSTASIS (ARGON PLASMA COAGULATION/BICAP);  Surgeon: Ladene Artist, MD;  Location: [facility];  Service: Endoscopy;  Laterality: N/A;   HOT HEMOSTASIS N/A 03/02/2020   Procedure: HOT  HEMOSTASIS (ARGON PLASMA COAGULATION/BICAP);  Surgeon: Yetta Flock, MD;  Location: [facility] ENDOSCOPY;  Service: Gastroenterology;  Laterality: N/A;   HOT HEMOSTASIS N/A 05/31/2020   Procedure: HOT HEMOSTASIS (ARGON PLASMA COAGULATION/BICAP);  Surgeon: Irene Shipper, MD;  Location: [facility] ENDOSCOPY;  Service: Endoscopy;  Laterality: N/A;   HOT HEMOSTASIS N/A 03/08/2022   Procedure: HOT HEMOSTASIS (ARGON PLASMA COAGULATION/BICAP);  Surgeon: Jackquline Denmark, MD;  Location: [facility] ENDOSCOPY;  Service: Gastroenterology;  Laterality: N/A;   INSERT / REPLACE / REMOVE PACEMAKER  ?2008   IR PERC TUN PERIT CATH WO PORT S&I /IMAG  07/05/2020   PERIPHERAL VASCULAR INTERVENTION Left 03/28/2021   Procedure: PERIPHERAL VASCULAR INTERVENTION;  Surgeon: Morgan Sandy, MD;  Location: [place] [facility];  Service: Cardiovascular;  Laterality: Left;   POLYPECTOMY  11/08/2017   Procedure: POLYPECTOMY;  Surgeon: Milus Banister, MD;  [facility];  Service: Endoscopy;;   PROCTOSCOPY N/A 05/22/2018   Procedure: RIGID PROCTOSCOPY;  Surgeon: Michael Boston, MD;  [facility];  Service: General;  Laterality: N/A;   RIGHT HEART CATH N/A 02/13/2018   Procedure: RIGHT HEART CATH;  Surgeon: [name], MD;  Location: [place] [facility];  Service: Cardiovascular;  Laterality: N/A;   RIGHT HEART CATH N/A 06/23/2020   Procedure: RIGHT HEART CATH;  Surgeon: [name], MD;  Location: [place] [facility];  Service: Cardiovascular;  Laterality: N/A;   RIGHT/LEFT HEART CATH AND CORONARY ANGIOGRAPHY N/A 12/11/2019  Procedure: RIGHT/LEFT HEART CATH AND CORONARY ANGIOGRAPHY;  Surgeon: [name], MD;  Location: [place] [facility];  Service: Cardiovascular;  Laterality: N/A;   SUBMUCOSAL TATTOO INJECTION  03/02/2020   Procedure: SUBMUCOSAL TATTOO INJECTION;  Surgeon: Yetta Flock, MD;  Location: [facility] ENDOSCOPY;  Service: Gastroenterology;;   SUBMUCOSAL TATTOO INJECTION  05/31/2020    Procedure: SUBMUCOSAL TATTOO INJECTION;  Surgeon: Irene Shipper, MD;  Location: [place];  Service: Endoscopy;;   TEE WITHOUT CARDIOVERSION N/A 08/02/2021   Procedure: TRANSESOPHAGEAL ECHOCARDIOGRAM (TEE);  Surgeon: [name], MD;  Location: [facility] ENDOSCOPY;  Service: Cardiovascular;  Laterality: N/A;   TUBAL LIGATION     XI ROBOTIC ASSISTED LOWER ANTERIOR RESECTION N/A 05/22/2018   Procedure: XI ROBOTIC ASSISTED SIGMOID COLOECTOMY MOBILIZATION OF SPLENIC FLEXURE, FIREFLY ASSESSMENT OF PERFUSION;  Surgeon: Michael Boston, MD;  [facility];  Service: General;  Laterality: N/A;  ERAS PATHWAY   Current Outpatient Medications on File Prior to Visit  Medication Sig Dispense Refill   albuterol (VENTOLIN HFA) 108 (90 Base) MCG/ACT inhaler Inhale 2 puffs into the lungs every 6 (six) hours as needed for wheezing or shortness of breath. 8 g 6   amiodarone (PACERONE) 200 MG tablet Take 1 tablet (200 mg total) by mouth daily. 90 tablet 3   apixaban (ELIQUIS) 2.5 MG TABS tablet Take 1 tablet (2.5 mg total) by mouth 2 (two) times daily. 60 tablet 11   benzonatate (TESSALON) 100 MG capsule Take 1 capsule (100 mg total) by mouth 3 (three) times daily as needed for cough. 30 capsule 0   cyclobenzaprine (FLEXERIL) 5 MG tablet Take 5 mg by mouth at bedtime as needed for muscle spasms.     diazepam (VALIUM) 5 MG tablet 1 tab po bid prn anxiety (Patient taking differently: Take 5 mg by mouth 2 (two) times daily as needed for anxiety.) 10 tablet 0   fluticasone (FLONASE) 50 MCG/ACT nasal spray Place 2 sprays into both nostrils daily. 16 g 2   fluticasone-salmeterol (ADVAIR) 250-50 MCG/ACT AEPB Inhale 1 puff into the lungs in the morning and at bedtime. 60 each 1   gabapentin (NEURONTIN) 300 MG capsule Take 1 capsule (300 mg total) by mouth daily. Fill one rx. Computer may have sent over 2 (Patient taking differently: Take 300 mg by mouth daily as needed (pain).) 90 capsule 2   HYDROcodone bit-homatropine  (HYCODAN) 5-1.5 MG/5ML syrup Take 5 mLs by mouth every 8 (eight) hours as needed for cough. 70 mL 0   hydrOXYzine (ATARAX) 10 MG tablet 1 tab po q hs prn itching (Patient taking differently: Take 10 mg by mouth at bedtime as needed for itching.) 10 tablet 0   ipratropium (ATROVENT HFA) 17 MCG/ACT inhaler Inhale 2 puffs into the lungs every 6 (six) hours as needed for wheezing. 12.9 g 1   levothyroxine (SYNTHROID) 125 MCG tablet Take 1 tablet (125 mcg total) by mouth daily before breakfast. 30 tablet 11   lidocaine-prilocaine (EMLA) cream Apply 1 Application topically 3 (three) times a week.     nitroGLYCERIN (NITROSTAT) 0.4 MG SL tablet Place 1 tablet (0.4 mg total) under the tongue every 5 (five) minutes as needed for chest pain. 25 tablet 0   pantoprazole (PROTONIX) 40 MG tablet Take 1 tablet (40 mg total) by mouth 2 (two) times daily before a meal. 30 tablet 2   rOPINIRole (REQUIP) 0.25 MG tablet Take 0.25 mg by mouth at bedtime.     traZODone (DESYREL) 50 MG tablet Take  0.5-1 tablets (25-50 mg total) by mouth at bedtime as needed for sleep. 30 tablet 3   XIIDRA 5 % SOLN Place 1 drop into both eyes at bedtime.     No current facility-administered medications on file prior to visit.    No Known Allergies Social History   Occupational History   Not on file  Tobacco Use   Smoking status: Never   Smokeless tobacco: Never  Vaping Use   Vaping Use: Never used  Substance and Sexual Activity   Alcohol use: Not Currently    Comment: 01/03/2018 "couple drinks/month"   Drug use: Not Currently    Types: Marijuana    Comment: VERY INTERMITTENT    Sexual activity: Not Currently   Family History  Problem Relation Age of Onset   Asthma Father    Heart attack Father    Asthma Sister    Lung cancer Sister    Heart attack Mother    Stroke Brother    Colon cancer Neg Hx    Esophageal cancer Neg Hx    Pancreatic cancer Neg Hx    Stomach cancer Neg Hx    Liver disease Neg Hx    Immunization  History  Administered Date(s) Administered   Influenza Whole 04/23/2008, 04/07/2010   Influenza, High Dose Seasonal PF 02/25/2016, 06/07/2017, 02/07/2021   Influenza,inj,Quad PF,6+ Mos 01/08/2013, 02/24/2014   Influenza-Unspecified 02/14/2022   PFIZER Comirnaty(Gray Top)Covid-19 Tri-Sucrose Vaccine 07/10/2019, 08/05/2019, 04/08/2020   PFIZER(Purple Top)SARS-COV-2 Vaccination 07/10/2019, 08/05/2019, 04/08/2020   Pneumococcal Conjugate-13 08/25/2013   Pneumococcal Polysaccharide-23 08/26/2014   Td 05/08/2006, 05/30/2016   Zoster, Live 01/08/2013     Review of Systems: Negative except as noted in the HPI.   Objective: Vitals:   04/18/22 1044  BP: (!) 156/72   Morgan Clay is a pleasant 76 y.o. female in NAD. AAO X 3.  Vascular Examination: CFT <3 seconds b/l LE. Palpable DP pulse(s) b/l LE. Palpable PT pulse(s) b/l LE. Pedal hair absent. No pain with calf compression b/l. Lower extremity skin temperature gradient within normal limits. No edema noted b/l LE. No cyanosis or clubbing noted b/l LE.  Dermatological Examination: No open wounds b/l LE. No interdigital macerations noted b/l LE. Toenails 1-5 b/l elongated, discolored, dystrophic, thickened, crumbly with subungual debris and tenderness to dorsal palpation. Porokeratotic lesion(s) submet head 2 left foot. No erythema, no edema, no drainage, no fluctuance.  Neurological Examination: Pt has subjective symptoms of neuropathy. Protective sensation intact 5/5 intact bilaterally with 10g monofilament b/l. Vibratory sensation intact b/l.  Musculoskeletal Examination: Muscle strength 5/5 to all lower extremity muscle groups bilaterally. No pain, crepitus or joint limitation noted with ROM bilateral LE. Pes planus deformity noted bilateral LE.  Footwear Assessment: Does the patient wear appropriate shoes? Yes. Does the patient need inserts/orthotics? Yes.  ADA Risk Categorization: Low Risk :  Patient has all of the  following: Intact protective sensation No prior foot ulcer  No severe deformity Pedal pulses present  Assessment: 1. Pain due to onychomycosis of toenails of both feet   2. Porokeratosis   3. Pes planus of both feet   4. Diabetes mellitus due to underlying condition with chronic kidney disease on chronic dialysis, without long-term current use of insulin ([place])   5. Encounter for diabetic foot exam ([place])    Plan: No orders of the defined types were placed in this encounter.  -Patient was evaluated and treated. All patient's and/or POA's questions/concerns answered on today's visit. -Diabetic foot examination performed today. -Stressed the  importance of good glycemic control and the detriment of not  controlling glucose levels in relation to the foot. -Continue diabetic foot care principles: inspect feet daily, monitor glucose as recommended by PCP and/or Endocrinologist, and follow prescribed diet per PCP, Endocrinologist and/or dietician. -Toenails 1-5 b/l were debrided in length and girth with sterile nail nippers and dremel without iatrogenic bleeding.  -Porokeratotic lesion(s) submet head 2 left foot pared and enucleated with sterile currette without incident. Total number of lesions debrided=1. -Patient/POA to call should there be question/concern in the interim. Return in about 3 months (around 07/18/2022).  Marzetta Board, DPM

## 2022-04-21 NOTE — Progress Notes (Unsigned)
04/25/2022 [place] [id number] Sep 06, 1945  Referring provider: Mackie Pai, PA-C Primary GI doctor: [name]  ASSESSMENT AND PLAN:   History of gastrointestinal bleeding with AVM's, diverticular bleeding, currently getting iron infusion and supportive care with dialysis Hemodynamically stable, still on eliquis low dose Denies melena/hemoatchezia, no symptoms of anemia Declines labs today, states hard stick and gets with dialysis/[facility] Will request labs from dialysis Will call if any bleeding, can consider long acting octreotide if has recurrent bleeding  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 (HCV) ([place]) Calculated MELD from INR with [facility] Has follow up and prefers labs there -[place] screening due 08/2022 -Hepatic encephalopathy-Patient does not have history of encephalopathy. - Ascites- does not have history of ascites.  No evidence of ascites on exam.  -Nutrition and low sodium diet discussed with patient and information given -Continue daily multivitamin -Recommended 30 minutes of aerobic and resistance exercise 3 days/week  Persistent atrial fibrillation (HCC)  eliquis 2.5 mg BID  Chronic combined systolic (congestive) and diastolic (congestive) heart failure (HCC) Monitor weight daily Follow up cardiology  Inguinal hernia, left Seen on CT 10/2021 Given information about hernias  Discussed ER precautions Declines referral to [facility] at this time, retractable on exam and with co morbid conditions unlikely to proceed to surgery unless medically indicated.    Patient Care Team: Saguier, [name] as PCP - General (Internal Medicine) [name], MD as Consulting Physician (Nephrology) [name], MD as Consulting Physician (Cardiology) [name], MD as Consulting Physician (Gastroenterology) [facility] [place] PRESENT ILLNESS: 76 y.o. female with a past medical history of A-fib/flutter on Eliquis,  CHF status post AICD, type 2 diabetes with end-stage renal disease on dialysis Monday Wednesday Friday, hepatitis C cirrhosis status post Harvoni, hypothyroidism, diverticular disease status post sigmoid colectomy 2020 with recurrent GI bleed secondary to gastric/small bowel AVMs and diverticular bleed present for follow-up post hospital visit for GI bleed.  10/26/2021 EGD and colonoscopy for hospitalization with GI bleed EGD identified erosive gastropathy without stigmata of recent bleeding.  The colonoscopy identified diverticulosis in the descending colon and cecum and internal hemorrhoids.  A diverticular bleed was suspected.  Her hemoglobin remained stable 9.9 -> 9 without requiring blood transfusions. 03/06/2022 office visit with [name] NP at our office with patient complaining of melena and dark red blood per rectum for 6 days generalized abdominal discomfort Hemi positive, sent to [facility] 03/08/2022 endoscopy Dr. Lyndel Safe for melena and history of diverticular bleed, liver cirrhosis showed no esophageal varices, no fundal varices 3 AVMs 2 to 3 mm without bleeding duodenal bulb status post APC second portion of the duodenum normal.  Last Dr. Melvyn Novas 04/07/2022 showed HGB 11.1, MCV 93.2, platelets 175. She is on eliquis 2.5 mg BID.  She is getting iron IV at dialysis, getting labs there, had last week.  No NSAIDS, no ETOH. No med medications.  She had a URI, states every time she coughs she has left inguinal pain. Does not hurt any other time.  No nausea or vomiting.  No melena, no hematochezia.  She denies constipation, diarrhea, no change in bowel habits.  10/24/21 CT AB and pelvis LLQ AB pain that showed trace free fluid left inguinal hernia.   There is not evidence of portal hypertension.  Patient does not have history of encephalopathy. She denies swelling.  She is not on spironolactone and lasix.  Wt Readings from Last 3 Encounters:  04/25/22 125 lb (56.7 kg)  04/11/22 127  lb (57.6 kg)   04/06/22 126 lb (57.2 kg)    Last HCC screen: 02/28/2022 no focal lesion, no gallstones.  Last INR:[facility]/20[facility] AFP: 08/11/2021  She  reports that she has never smoked. She has never used smokeless tobacco. She reports that she does not currently use alcohol. She reports that she does not currently use drugs after having used the following drugs: Marijuana.  Current Medications:   Current Outpatient Medications (Endocrine & Metabolic):    levothyroxine (SYNTHROID) 125 MCG tablet, Take 1 tablet (125 mcg total) by mouth daily before breakfast.  Current Outpatient Medications (Cardiovascular):    amiodarone (PACERONE) 200 MG tablet, Take 1 tablet (200 mg total) by mouth daily.   nitroGLYCERIN (NITROSTAT) 0.4 MG SL tablet, Place 1 tablet (0.4 mg total) under the tongue every 5 (five) minutes as needed for chest pain.  Current Outpatient Medications (Respiratory):    albuterol (VENTOLIN HFA) 108 (90 Base) MCG/ACT inhaler, Inhale 2 puffs into the lungs every 6 (six) hours as needed for wheezing or shortness of breath.   benzonatate (TESSALON) 100 MG capsule, Take 1 capsule (100 mg total) by mouth 3 (three) times daily as needed for cough.   fluticasone (FLONASE) 50 MCG/ACT nasal spray, Place 2 sprays into both nostrils daily.   fluticasone-salmeterol (ADVAIR) 250-50 MCG/ACT AEPB, Inhale 1 puff into the lungs in the morning and at bedtime.   HYDROcodone bit-homatropine (HYCODAN) 5-1.5 MG/5ML syrup, Take 5 mLs by mouth every 8 (eight) hours as needed for cough.   ipratropium (ATROVENT HFA) 17 MCG/ACT inhaler, Inhale 2 puffs into the lungs every 6 (six) hours as needed for wheezing.   Current Outpatient Medications (Hematological):    apixaban (ELIQUIS) 2.5 MG TABS tablet, Take 1 tablet (2.5 mg total) by mouth 2 (two) times daily.  Current Outpatient Medications (Other):    cyclobenzaprine (FLEXERIL) 5 MG tablet, Take 5 mg by mouth at bedtime as needed for muscle spasms.   diazepam  (VALIUM) 5 MG tablet, 1 tab po bid prn anxiety (Patient taking differently: Take 5 mg by mouth 2 (two) times daily as needed for anxiety.)   gabapentin (NEURONTIN) 300 MG capsule, Take 1 capsule (300 mg total) by mouth daily. Fill one rx. Computer may have sent over 2 (Patient taking differently: Take 300 mg by mouth daily as needed (pain).)   hydrOXYzine (ATARAX) 10 MG tablet, 1 tab po q hs prn itching (Patient taking differently: Take 10 mg by mouth at bedtime as needed for itching.)   lidocaine-prilocaine (EMLA) cream, Apply 1 Application topically 3 (three) times a week.   pantoprazole (PROTONIX) 40 MG tablet, Take 1 tablet (40 mg total) by mouth 2 (two) times daily before a meal.   rOPINIRole (REQUIP) 0.25 MG tablet, Take 0.25 mg by mouth at bedtime.   traZODone (DESYREL) 50 MG tablet, Take 0.5-1 tablets (25-50 mg total) by mouth at bedtime as needed for sleep.   XIIDRA 5 % SOLN, Place 1 drop into both eyes at bedtime.  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) 09/24/2020   Chronic obstructive pulmonary disease (HCC)    Chronic systolic congestive heart failure ([place])    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 (HCV) ([place])    HEPATITIS C - s/p treatment with Harvoni, saw hepatology, Dawn Drazek   Diverticulitis of left colon status post robotic low anterior to sigmoid resection 05/22/2018 07/31/2017   End stage renal disease ([place]) 03/22/2021   Glaucoma    Gout    History of  blood transfusion ~ 11/2017   Hypertension associated with diabetes ([place]) 06/07/2017   Hypothyroidism    LBBB (left bundle branch block)    Malnutrition of moderate degree 07/09/2020   MDD (major depressive disorder), recurrent, in partial remission ([place]) 06/07/2017   OBSTRUCTIVE SLEEP APNEA 11/14/2007   no CPAP   OSTEOPENIA 09/30/2008   Persistent atrial fibrillation ([place]) 02/06/2020   Polyneuropathy associated with underlying disease ([place]) 06/09/2021   Pulmonary hypertension ([place]) on echocardiogram 01/14/2018   Secondary esophageal varices without bleeding ([employer]) 12/23/2020   Formatting of this note might be different from the original. Patient had trace esophageal varices during small bowel enteroscopy in October 2021, follow-up EGD in January 2022 with no varices.  She previously had GI bleeding thought to be from AVM versus GAVE.  She is no longer on carvedilol and will need surveillance endoscopy.  Patient was previously followed at [place] and I have asked her    Secondary hypercoagulable state ([place]) 02/06/2020   Type 2 diabetes, controlled, with renal manifestation ([place]) 11/24/2013   Allergies: No Known Allergies   Surgical History:  She  has a past surgical history that includes Cardiac catheterization; Cardioversion (N/A, 12/14/2014); Cardiac catheterization (N/A, 10/06/2015); CARDIOVERSION (N/A, 02/26/2017); Cardioversion (N/A, 07/24/2017); Esophagogastroduodenoscopy (N/A, 10/26/2017); Hot hemostasis (N/A, 10/26/2017); Esophagogastroduodenoscopy (egd) with propofol (N/A, 11/07/2017); Colonoscopy (N/A, 11/08/2017); polypectomy (11/08/2017); Appendectomy; Insert / replace / remove pacemaker (?2008); Dilation and curettage of uterus; Tubal ligation; RIGHT HEART CATH (N/A, 02/13/2018); XI robotic assisted lower anterior resection (N/A, 05/22/2018); Proctoscopy (N/A, 05/22/2018); RIGHT/LEFT HEART CATH AND CORONARY ANGIOGRAPHY (N/A, 12/11/2019); Cardioversion (N/A, 02/12/2020); enteroscopy  (N/A, 03/02/2020); Submucosal tattoo injection (03/02/2020); Hot hemostasis (N/A, 03/02/2020); Givens capsule study (N/A, 05/19/2020); Esophagogastroduodenoscopy (egd) with propofol (N/A, 05/29/2020); Givens capsule study (N/A, 05/29/2020); Colonoscopy with propofol (N/A, 05/31/2020); Submucosal tattoo injection (05/31/2020); Hot hemostasis (N/A, 05/31/2020); RIGHT HEART CATH (N/A, 06/23/2020); IR Perc [employer] Cath [place] Port (07/05/2020); AV fistula placement (Left, 08/05/2020); Bascilic vein transposition (Left, 09/16/2020); Hematoma evacuation (Left, 09/24/2020); A/V Fistulagram (N/A, 03/28/2021); PERIPHERAL VASCULAR INTERVENTION (Left, 03/28/2021); TEE without cardioversion (N/A, 08/02/2021); Carpal tunnel release (07/21/2021); Cardioversion (N/A, 09/15/2021); Esophagogastroduodenoscopy (egd) with propofol (N/A, 10/26/2021); Colonoscopy with propofol (N/A, 10/26/2021); Esophagogastroduodenoscopy (egd) with propofol (N/A, 03/08/2022); and Hot hemostasis (N/A, 03/08/2022). Family History:  Her family history includes Asthma in her father and sister; Heart attack in her father and mother; Lung cancer in her sister; Stroke in her brother.  REVIEW OF SYSTEMS  : All other systems reviewed and negative except where noted in the History of Present Illness.  PHYSICAL EXAM: BP 118/68   Pulse 76   Ht '4\' 11"'$  (1.499 m)   Wt 125 lb (56.7 kg)   BMI 25.25 kg/m  General :  Alert, well developed female in no acute distress Head:  Normocephalic and atraumatic. Eyes :  sclerae anicteric,conjunctive pink  Heart:  regular rate and rhythm Pulm:  Clear anteriorly; no wheezing Abdomen:   Soft, Obese AB, skin exam normal, Normal bowel sounds. mild  tenderness in the epigastrium. Without guarding and Without rebound, without organomegaly. no  fluid wave, no  shifting dullness. Left inguinal hernia palpated, tender to palpation, able to reduce but some resistance/difficulty.  Extremities:   Without edema. Msk:  Symmetrical  without gross deformities. Peripheral pulses intact.  Neurologic: Alert and  oriented x4;  grossly normal neurologically. without asterixis or clonus.  Skin:   without jaundice.  Psychiatric:  Demonstrates good judgement and reason without abnormal affect or behaviors.   RELEVANT LABS AND IMAGING: CBC    Component Value Date/Time   WBC 5.5 04/07/2022 0817   RBC 3.79 (L) 04/07/2022 0817   HGB 11.1 (L) 04/07/2022 0817   HGB 11.0 (L) 06/19/2019 1010   HGB 8.4 (L) 10/04/2017 1534   HCT 35.4 (L) 04/07/2022 0817   HCT 26.1 (L) 10/04/2017 1534   PLT 175.0 04/07/2022 0817   PLT 192 06/19/2019 1010   PLT 201 10/04/2017 1534   MCV 93.2 04/07/2022 0817   MCV 87 10/04/2017 1534   MCH 29.6 03/27/2022 1650   MCHC 31.5 04/07/2022 0817   RDW 18.4 (H) 04/07/2022 0817   RDW 17.3 (H) 10/04/2017 1534   LYMPHSABS 1.3 04/07/2022 0817   LYMPHSABS 2.1 10/04/2017 1534   MONOABS 0.8 04/07/2022 0817   EOSABS 0.2 04/07/2022 0817   EOSABS 0.2 10/04/2017 1534   BASOSABS 0.0 04/07/2022 0817   BASOSABS 0.0 10/04/2017 1534    CMP     Component Value Date/Time   NA 140 04/07/2022 0817   NA 140 12/25/2018 1514   K 4.4 04/07/2022 0817   CL 93 (L) 04/07/2022 0817   CO2 34 (H) 04/07/2022 0817   GLUCOSE 101 (H) 04/07/2022 0817   BUN 29 (H) 04/07/2022 0817   BUN 33 (H) 12/25/2018 1514   CREATININE 6.38 (HH) 04/07/2022 0817   CREATININE 2.24 (H) 02/04/2020 1416   CALCIUM 9.5 04/07/2022 0817   PROT 8.0 04/07/2022 0817   PROT 8.3 07/19/2017 1503   ALBUMIN 4.4 04/07/2022 0817   ALBUMIN 4.9 (H) 07/19/2017 1503   AST 18 04/07/2022 0817   AST 14 (L) 11/23/2017 1132   ALT 11 04/07/2022 0817   ALT 9 11/23/2017 1132   ALKPHOS 79 04/07/2022 0817   BILITOT 0.7 04/07/2022 0817   BILITOT 0.3 11/23/2017 1132   GFRNONAA 9 (L) 03/27/2022 1650   GFRNONAA 21 (L) 02/04/2020 1416   GFRAA 25 (L) 02/04/2020 1416     Vladimir Crofts, PA-C 9:44 AM

## 2022-04-23 ENCOUNTER — Encounter: Payer: Self-pay | Admitting: Podiatry

## 2022-04-25 ENCOUNTER — Ambulatory Visit (INDEPENDENT_AMBULATORY_CARE_PROVIDER_SITE_OTHER): Payer: Medicare Other | Admitting: Physician Assistant

## 2022-04-25 ENCOUNTER — Encounter: Payer: Self-pay | Admitting: Physician Assistant

## 2022-04-25 VITALS — BP 118/68 | HR 76 | Ht 59.0 in | Wt 125.0 lb

## 2022-04-25 DIAGNOSIS — K5520 Angiodysplasia of colon without hemorrhage: Secondary | ICD-10-CM | POA: Diagnosis not present

## 2022-04-25 DIAGNOSIS — D649 Anemia, unspecified: Secondary | ICD-10-CM | POA: Diagnosis not present

## 2022-04-25 DIAGNOSIS — I4819 Other persistent atrial fibrillation: Secondary | ICD-10-CM

## 2022-04-25 DIAGNOSIS — K4090 Unilateral inguinal hernia, without obstruction or gangrene, not specified as recurrent: Secondary | ICD-10-CM

## 2022-04-25 DIAGNOSIS — B1920 Unspecified viral hepatitis C without hepatic coma: Secondary | ICD-10-CM

## 2022-04-25 DIAGNOSIS — I5042 Chronic combined systolic (congestive) and diastolic (congestive) heart failure: Secondary | ICD-10-CM

## 2022-04-25 DIAGNOSIS — Z8719 Personal history of other diseases of the digestive system: Secondary | ICD-10-CM

## 2022-04-25 DIAGNOSIS — K7469 Other cirrhosis of liver: Secondary | ICD-10-CM

## 2022-04-25 DIAGNOSIS — Z7901 Long term (current) use of anticoagulants: Secondary | ICD-10-CM

## 2022-04-25 NOTE — Patient Instructions (Addendum)
_______________________________________________________  If you are age 74 or older, your body mass index should be between 23-30. Your Body mass index is 25.25 kg/m. If this is out of the aforementioned range listed, please consider follow up with your Primary Care Provider.  If you are age 60 or younger, your body mass index should be between 19-25. Your Body mass index is 25.25 kg/m. If this is out of the aformentioned range listed, please consider follow up with your Primary Care Provider.   ________________________________________________________  The [facility] providers would like to encourage you to use [facility] to communicate with providers for non-urgent requests or questions.  Due to long hold times on the telephone, sending your provider a message by [facility] may be a faster and more efficient way to get a response.  Please allow 48 business hours for a response.  Please remember that this is for non-urgent requests.  _______________________________________________________  Please follow up in 6 months with Dr Carlean Purl. Give [name] a call at [phone] to schedule an appointment.  Go to the ER if you have any severe weakness, severe AB pain, vomit blood, dark red blood in your bowel movement, shortenss of breath or chest pain.   Call back for referral to surgery if pain does not get better, uncertain if they will be willing to do anything with your medical history.  Can wear brace/belt to help hold it in.  Please go to the ER if you have any severe AB pain, unable to hold down food/water, blood in stool or vomit, chest pain, shortness of breath, or any worsening symptoms.   Inguinal Hernia, Adult An inguinal hernia develops when fat or the intestines push through a weak spot in a muscle where the leg meets the lower abdomen (groin). This creates a bulge. This kind of hernia could also be: In the scrotum, if you are female. In folds of skin around the vagina, if you are female. There are  three types of inguinal hernias: Hernias that can be pushed back into the abdomen (are reducible). This type rarely causes pain. Hernias that are not reducible (are incarcerated). Hernias that are not reducible and lose their blood supply (are strangulated). This type of hernia requires emergency surgery. What are the causes? This condition is caused by having a weak spot in the muscles or tissues in your groin. This develops over time. The hernia may poke through the weak spot when you suddenly strain your lower abdominal muscles, such as when you: Lift a heavy object. Strain to have a bowel movement. Constipation can lead to straining. Cough. What increases the risk? This condition is more likely to develop in: Males. Pregnant females. People who: Are overweight. Work in jobs that require long periods of standing or heavy lifting. Have had an inguinal hernia before. Smoke or have lung disease. These factors can lead to long-term (chronic) coughing. What are the signs or symptoms? Symptoms may depend on the size of the hernia. Often, a small inguinal hernia has no symptoms. Symptoms of a larger hernia may include: A bulge in the groin area. This is easier to see when standing. It might not be visible when lying down. Pain or burning in the groin. This may get worse when lifting, straining, or coughing. A dull ache or a feeling of pressure in the groin. An unusual bulge in the scrotum, in males. Symptoms of a strangulated inguinal hernia may include: A bulge in your groin that is very painful and tender to the touch.  A bulge that turns red or purple. Fever, nausea, and vomiting. Inability to have a bowel movement or to pass gas. How is this diagnosed? This condition is diagnosed based on your symptoms, your medical history, and a physical exam. Your health care provider may feel your groin area and ask you to cough. How is this treated? Treatment depends on the size of your hernia and  whether you have symptoms. If you do not have symptoms, your health care provider may have you watch your hernia carefully and have you come in for follow-up visits. If your hernia is large or if you have symptoms, you may need surgery to repair the hernia. Follow these instructions at home: Lifestyle Avoid lifting heavy objects. Avoid standing for long periods of time. Do not use any products that contain nicotine or tobacco. These products include cigarettes, chewing tobacco, and vaping devices, such as e-cigarettes. If you need help quitting, ask your health care provider. Maintain a healthy weight. Preventing constipation You may need to take these actions to prevent or treat constipation: Drink enough fluid to keep your urine pale yellow. Take over-the-counter or prescription medicines. Eat foods that are high in fiber, such as beans, whole grains, and fresh fruits and vegetables. Limit foods that are high in fat and processed sugars, such as fried or sweet foods. General instructions You may try to push the hernia back in place by very gently pressing on it while lying down. Do not try to force the bulge back in if it will not push in easily. Watch your hernia for any changes in shape, size, or color. Get help right away if you notice any changes. Take over-the-counter and prescription medicines only as told by your health care provider. Keep all follow-up visits. This is important. Contact a health care provider if: You have a fever or chills. You develop new symptoms. Your symptoms get worse. Get help right away if: You have pain in your groin that suddenly gets worse. You have a bulge in your groin that: Suddenly gets bigger and does not get smaller. Becomes red or purple or painful to the touch. You are a man and you have a sudden pain in your scrotum, or the size of your scrotum suddenly changes. You cannot push the hernia back in place by very gently pressing on it when you  are lying down. You have nausea or vomiting that does not go away. You have a fast heartbeat. You cannot have a bowel movement or pass gas. These symptoms may represent a serious problem that is an emergency. Do not wait to see if the symptoms will go away. Get medical help right away. Call your local emergency services (911 in the [place] Summary An inguinal hernia develops when fat or the intestines push through a weak spot in a muscle where your leg meets your lower abdomen (groin). This condition is caused by having a weak spot in muscles or tissues in your groin. Symptoms may depend on the size of the hernia, and they may include pain or swelling in your groin. A small inguinal hernia often has no symptoms. Treatment may not be needed if you do not have symptoms. If you have symptoms or a large hernia, you may need surgery to repair the hernia. Avoid lifting heavy objects. Also, avoid standing for long periods of time. This information is not intended to replace advice given to you by your health care provider. Make sure you discuss any questions you have with  your health care provider. Document Revised: 12/23/2019 Document Reviewed: 12/23/2019 Elsevier Patient Education  [place].  It was a pleasure to see you today!  Thank you for trusting me with your gastrointestinal care!

## 2022-04-27 NOTE — Progress Notes (Signed)
Remote ICD transmission.   

## 2022-05-02 ENCOUNTER — Encounter: Payer: Medicare Other | Admitting: Internal Medicine

## 2022-05-11 DIAGNOSIS — L299 Pruritus, unspecified: Secondary | ICD-10-CM | POA: Diagnosis not present

## 2022-05-11 DIAGNOSIS — Z992 Dependence on renal dialysis: Secondary | ICD-10-CM | POA: Diagnosis not present

## 2022-05-11 DIAGNOSIS — D631 Anemia in chronic kidney disease: Secondary | ICD-10-CM | POA: Diagnosis not present

## 2022-05-11 DIAGNOSIS — D509 Iron deficiency anemia, unspecified: Secondary | ICD-10-CM | POA: Diagnosis not present

## 2022-05-11 DIAGNOSIS — N186 End stage renal disease: Secondary | ICD-10-CM | POA: Diagnosis not present

## 2022-05-11 DIAGNOSIS — E119 Type 2 diabetes mellitus without complications: Secondary | ICD-10-CM | POA: Diagnosis not present

## 2022-05-11 DIAGNOSIS — N2581 Secondary hyperparathyroidism of renal origin: Secondary | ICD-10-CM | POA: Diagnosis not present

## 2022-05-12 DIAGNOSIS — D631 Anemia in chronic kidney disease: Secondary | ICD-10-CM | POA: Diagnosis not present

## 2022-05-12 DIAGNOSIS — L299 Pruritus, unspecified: Secondary | ICD-10-CM | POA: Diagnosis not present

## 2022-05-12 DIAGNOSIS — E119 Type 2 diabetes mellitus without complications: Secondary | ICD-10-CM | POA: Diagnosis not present

## 2022-05-12 DIAGNOSIS — D509 Iron deficiency anemia, unspecified: Secondary | ICD-10-CM | POA: Diagnosis not present

## 2022-05-12 DIAGNOSIS — Z992 Dependence on renal dialysis: Secondary | ICD-10-CM | POA: Diagnosis not present

## 2022-05-12 DIAGNOSIS — N2581 Secondary hyperparathyroidism of renal origin: Secondary | ICD-10-CM | POA: Diagnosis not present

## 2022-05-12 DIAGNOSIS — N186 End stage renal disease: Secondary | ICD-10-CM | POA: Diagnosis not present

## 2022-05-15 ENCOUNTER — Telehealth: Payer: Self-pay

## 2022-05-15 DIAGNOSIS — E119 Type 2 diabetes mellitus without complications: Secondary | ICD-10-CM | POA: Diagnosis not present

## 2022-05-15 DIAGNOSIS — N2581 Secondary hyperparathyroidism of renal origin: Secondary | ICD-10-CM | POA: Diagnosis not present

## 2022-05-15 DIAGNOSIS — D631 Anemia in chronic kidney disease: Secondary | ICD-10-CM | POA: Diagnosis not present

## 2022-05-15 DIAGNOSIS — N186 End stage renal disease: Secondary | ICD-10-CM | POA: Diagnosis not present

## 2022-05-15 DIAGNOSIS — L299 Pruritus, unspecified: Secondary | ICD-10-CM | POA: Diagnosis not present

## 2022-05-15 DIAGNOSIS — Z992 Dependence on renal dialysis: Secondary | ICD-10-CM | POA: Diagnosis not present

## 2022-05-15 DIAGNOSIS — D509 Iron deficiency anemia, unspecified: Secondary | ICD-10-CM | POA: Diagnosis not present

## 2022-05-15 NOTE — Telephone Encounter (Signed)
Device alert for RRT 1/7 - route to triage Hx of PAF, 1 of 2 episodes treated, up to 48 bursts of ATP Burden 0.2%, Amiodarone, Eliquis LA  Notified patient and confirmed she will be at appointment with Oda Kilts PA-C this week on 05/18/22.  Also, note heart logic score is high (see below).  Will also copy to Sharman Cheek, RN. CHF clinic.

## 2022-05-16 ENCOUNTER — Encounter: Payer: Self-pay | Admitting: Internal Medicine

## 2022-05-16 ENCOUNTER — Ambulatory Visit (INDEPENDENT_AMBULATORY_CARE_PROVIDER_SITE_OTHER): Payer: Medicare Other | Admitting: Internal Medicine

## 2022-05-16 ENCOUNTER — Ambulatory Visit (INDEPENDENT_AMBULATORY_CARE_PROVIDER_SITE_OTHER): Payer: Medicare Other

## 2022-05-16 VITALS — BP 110/40 | HR 78 | Ht 59.0 in | Wt 130.4 lb

## 2022-05-16 DIAGNOSIS — R058 Other specified cough: Secondary | ICD-10-CM

## 2022-05-16 DIAGNOSIS — I700 Atherosclerosis of aorta: Secondary | ICD-10-CM | POA: Diagnosis not present

## 2022-05-16 DIAGNOSIS — R0609 Other forms of dyspnea: Secondary | ICD-10-CM | POA: Diagnosis not present

## 2022-05-16 NOTE — Patient Instructions (Signed)
Try off advair (powdered inhaler)  Only use your albuterol as a rescue medication to be used if you can't catch your breath by resting or doing a relaxed purse lip breathing pattern.  - The less you use it, the better it will work when you need it. - Ok to use up to 2 puffs  every 4 hours if you must but call for immediate appointment if use goes up over your usual need - Don't leave home without it !!  (think of it like the spare tire for your car)   Also  Ok to try albuterol 15 min before an activity (on alternating days)  that you know would usually make you short of breath and see if it makes any difference and if makes none then don't take albuterol after activity unless you can't catch your breath as this means it's the resting that helps, not the albuterol.      Work on inhaler technique:  relax and gently blow all the way out then take a nice smooth full deep breath back in, triggering the inhaler at same time you start breathing in.  Hold breath in for at least  5 seconds if you can.    Please remember to go to the  x-ray department  for your tests - we will call you with the results when they are available     Please schedule a follow up office visit in 4 weeks, sooner if needed  with all medications /inhalers/ solutions in hand so we can verify exactly what you are taking. This includes all medications from all doctors and over the counters

## 2022-05-16 NOTE — Progress Notes (Signed)
Morgan Clay, female    DOB: May 08, 1946   MRN: [id number]   Brief patient profile:  57   yobf never smoker from [place]  referred to pulmonary clinic 05/16/2022 for post hosp f/u   Admit Date: 03/27/2022 Discharge date: 03/29/2022  Brief Summary: 77 year old-with history of ESRD on HD-A-fib on Eliquis, chronic HFpEF-s/p AICD placement, , hepatitis C related cirrhosis, HTN-who was evaluated for shortness of breath/cough for several weeks.   Brief Clay Course: Shortness of breath/chronic cough-likely multifactorial due to bronchospasm in the setting of possible asthma exacerbation, GERD Evaluated by PCCM-started on Yupelri/Brovana/budesonide nebulizers with some improvement overnight.   Requesting discharge today-she feels somewhat better.  I did not hear her coughing in the room at all.  She is comfortably on room air.  On exam-some scattered rhonchi but overall clear to auscultation. Will place her on inhaler regimen-including inhaled steroids and have her follow-up with Dr. Sherene Clay as outlined by Morgan Clay. Note-recent COVID/influenza PCR were negative.   ESRD on HD [facility] Nephrology followed during this hospitalization Resume usual outpatient dialysis regimen   Chronic combined systolic/diastolic heart failure Euvolemic Volume removal with HD   PAF Continue amiodarone-CXR without any infiltrates-not thought to have ILD at this point. Continue Eliquis.   Hypothyroidism Continue Synthroid     Discharge Diagnoses:  Principal Problem:   Dyspnea   Acquired hypothyroidism   Biventricular ICD (implantable cardioverter-defibrillator) in place   Cough   Persistent atrial fibrillation (HCC)   ESRD on dialysis (HCC)   Chronic combined systolic (congestive) and diastolic (congestive) heart failure (HCC)   Glaucoma   HYPERTENSION, BENIGN   Prolonged QT interval    History of Present Illness  05/16/2022  Pulmonary/ 1st office eval/Morgan Clay maint on advair dpi Chief Complaint   Patient presents with   Follow-up    Still has a cough (dry)   Dyspnea:  baseline slow pace / does some  housework but still needs hc  parking/ leans on shopping cart  Cough: no pattern more likely during the day /no globus  Sleep: flat bed one or two pillows  SABA use: not using while on advair  No obvious day to day or daytime pattern/variability or assoc excess/ purulent sputum or mucus plugs or hemoptysis or cp or chest tightness, subjective wheeze or overt sinus or hb symptoms.   Sleeping as above without nocturnal  or early am exacerbation  of respiratory  c/o's or need for noct saba. Also denies any obvious fluctuation of symptoms with weather or environmental changes or other aggravating or alleviating factors except as outlined above   No unusual exposure hx or h/o childhood pna/ asthma or knowledge of premature birth.  Current Allergies, Complete Past Medical History, Past Surgical History, Family History, and Social History were reviewed in Morgan Clay record.  ROS  The following are not active complaints unless bolded Hoarseness, sore throat, dysphagia, dental problems, itching, sneezing,  nasal congestion or discharge of excess mucus or purulent secretions, ear ache,   fever, chills, sweats, unintended wt loss or wt gain, classically pleuritic or exertional cp,  orthopnea pnd or arm/hand swelling  or leg swelling, presyncope, palpitations, abdominal pain, anorexia, nausea, vomiting, diarrhea  or change in bowel habits or change in bladder habits, change in stools or change in urine, dysuria, hematuria,  rash, arthralgias, visual complaints, headache, numbness, weakness or ataxia or problems with walking or coordination,  change in mood or  memory.  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) 09/24/2020   Chronic obstructive pulmonary disease (HCC)    Chronic systolic congestive heart failure (HCC)    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 (HCV) (HCC)    HEPATITIS C - s/p treatment with Harvoni, saw hepatology, Morgan Clay   Diverticulitis of left colon status post robotic low anterior to sigmoid resection 05/22/2018 07/31/2017   End stage renal disease (HCC) 03/22/2021   Glaucoma    Gout    History of blood transfusion ~ 11/2017   Hypertension associated with diabetes (HCC) 06/07/2017   Hypothyroidism    LBBB (left bundle branch block)    Malnutrition of moderate degree 07/09/2020   MDD (major depressive disorder), recurrent, in partial remission (HCC) 06/07/2017   OBSTRUCTIVE SLEEP APNEA 11/14/2007   no CPAP   OSTEOPENIA 09/30/2008   Persistent atrial fibrillation (HCC) 02/06/2020   Polyneuropathy associated with underlying disease (HCC) 06/09/2021   Pulmonary hypertension (HCC) on echocardiogram 01/14/2018   Secondary esophageal varices without bleeding (HCC) 12/23/2020   Formatting of this note might be different from the original. Patient had trace esophageal varices during small bowel enteroscopy in October 2021, follow-up EGD in January 2022 with no varices.  She previously had GI bleeding thought to be from AVM versus GAVE.  She is no longer on carvedilol and will need surveillance endoscopy.  Patient was previously followed at [facility] and I have asked her    Secondary hypercoagulable state (HCC) 02/06/2020   Type 2 diabetes, controlled, with renal manifestation (HCC) 11/24/2013    Outpatient Medications Prior to Visit   Medication Sig Dispense Refill   albuterol (VENTOLIN HFA) 108 (90 Base) MCG/ACT inhaler Inhale 2 puffs into the lungs every 6 (six) hours as needed for wheezing or shortness of breath. 8 g 6   amiodarone (PACERONE) 200 MG tablet Take 1 tablet (200 mg total) by mouth daily. 90 tablet 3   apixaban (ELIQUIS) 2.5 MG TABS tablet Take 1 tablet (2.5 mg total) by mouth 2 (two) times daily. 60 tablet 11   benzonatate (TESSALON) 100 MG capsule Take 1 capsule (100 mg total) by mouth 3 (three) times daily as needed for cough. 30 capsule 0   cyclobenzaprine (FLEXERIL) 5 MG tablet Take 5 mg by mouth at bedtime as needed for muscle spasms.     diazepam (VALIUM) 5 MG tablet 1 tab po bid prn anxiety (Patient taking differently: Take 5 mg by mouth 2 (two) times daily as needed for anxiety.) 10 tablet 0   fluticasone (FLONASE) 50 MCG/ACT nasal spray Place 2 sprays into both nostrils daily. 16 g 2   fluticasone-salmeterol (ADVAIR) 250-50 MCG/ACT AEPB Inhale 1 puff into the lungs in the morning and at bedtime. 60 each 1   gabapentin (NEURONTIN) 300 MG capsule Take 1 capsule (300 mg total) by mouth daily. Fill one rx. Computer may have sent over 2 (Patient taking differently: Take 300 mg by mouth daily as needed (pain).) 90 capsule 2   HYDROcodone bit-homatropine (  HYCODAN) 5-1.5 MG/5ML syrup Take 5 mLs by mouth every 8 (eight) hours as needed for cough. 70 mL 0   hydrOXYzine (ATARAX) 10 MG tablet 1 tab po q hs prn itching (Patient taking differently: Take 10 mg by mouth at bedtime as needed for itching.) 10 tablet 0   ipratropium (ATROVENT HFA) 17 MCG/ACT inhaler Inhale 2 puffs into the lungs every 6 (six) hours as needed for wheezing. 12.9 g 1   levothyroxine (SYNTHROID) 125 MCG tablet Take 1 tablet (125 mcg total) by mouth daily before breakfast. 30 tablet 11   lidocaine-prilocaine (EMLA) cream Apply 1 Application topically 3 (three) times a week.     nitroGLYCERIN (NITROSTAT) 0.4 MG SL tablet Place 1 tablet (0.4 mg  total) under the tongue every 5 (five) minutes as needed for chest pain. 25 tablet 0   pantoprazole (PROTONIX) 40 MG tablet Take 1 tablet (40 mg total) by mouth 2 (two) times daily before a meal. 30 tablet 2   rOPINIRole (REQUIP) 0.25 MG tablet Take 0.25 mg by mouth at bedtime.     traZODone (DESYREL) 50 MG tablet Take 0.5-1 tablets (25-50 mg total) by mouth at bedtime as needed for sleep. 30 tablet 3   XIIDRA 5 % SOLN Place 1 drop into both eyes at bedtime.     No facility-administered medications prior to visit.     Objective:     BP (!) 110/40 (BP Location: Right Arm)   Pulse 78   Ht 4\' 11"  (1.499 m)   Wt 130 lb 6.4 oz (59.1 kg)   SpO2 98%   BMI 26.34 kg/m   SpO2: 98 %  Pleasant amb bf slt hoarse    HEENT : Oropharynx  clear        NECK :  without  apparent JVD/ palpable Nodes/TM    LUNGS: no acc muscle use,  Nl contour chest which is clear to A and P bilaterally without cough on insp or exp maneuvers   CV:  RRR  no s3 or murmur or increase in P2, and no edema   ABD:  soft and nontender with nl inspiratory excursion in the supine position. No bruits or organomegaly appreciated   MS:  Nl gait/ ext warm without deformities Or obvious joint restrictions  calf tenderness, cyanosis or clubbing    SKIN: warm and dry without lesions    NEURO:  alert, approp, nl sensorium with  no motor or cerebellar deficits apparent.       CXR PA and Lateral:   05/16/2022 :    I personally reviewed images and agree with radiology impression as follows:    Mild cardiomegaly without evidence of acute or active cardiopulmonary disease.   Assessment   DOE (dyspnea on exertion) Onset 04/05/22  - 05/16/2022   Walked on RA  x  3  lap(s) =  approx 750  ft  @ nl  pace, stopped due to end of study  with lowest 02 sats 99% and no sob   - 05/16/2022 c/o dry cough likely UACS so rec trial off advair dpi   See UACS (see separate a/p)   Upper airway cough syndrome Onset June 2023 p EGD  - try  off advair dpi 05/16/2022   Upper airway cough syndrome (previously labeled PNDS),  is so named because it's frequently impossible to sort out how much is  CR/sinusitis with freq throat clearing (which can be related to primary GERD)   vs  causing  secondary (" extra  esophageal")  GERD from wide swings in gastric pressure that occur with throat clearing, often  promoting self use of mint and menthol lozenges that reduce the lower esophageal sphincter tone and exacerbate the problem further in a cyclical fashion.   These are the same pts (now being labeled as having "irritable larynx syndrome" by some cough centers) who not infrequently have a history of having failed to tolerate ace inhibitors,  dry powder inhalers(especially advair)  or biphosphonates or report having atypical/extraesophageal reflux symptoms that don't respond to standard doses of PPI  and are easily confused as having aecopd or asthma flares by even experienced allergists/ pulmonologists (myself included).   Will try off advair and continue gerd rx.  If worse can use saba prn:  Re SABA :  I spent extra time with pt today reviewing appropriate use of albuterol for prn use on exertion with the following points: 1) saba is for relief of sob that does not improve by walking a slower pace or resting but rather if the pt does not improve after trying this first. 2) If the pt is convinced, as many are, that saba helps recover from activity faster then it's easy to tell if this is the case by re-challenging : ie stop, take the inhaler, then p 5 minutes try the exact same activity (intensity of workload) that just caused the symptoms and see if they are substantially diminished or not after saba 3) if there is an activity that reproducibly causes the symptoms, try the saba 15 min before the activity on alternate days   If in fact the saba really does help, then fine to continue to use it prn but advised may need to look closer at the maintenance  regimen (for now nothing but low threshold to add back symbicort 80)  being used to achieve better control of airways disease with exertion.  - The proper method of use, as well as anticipated side effects, of a metered-dose inhaler were discussed and demonstrated to the patient using teach back method  and empty hfa device.  Each maintenance medication was reviewed in detail including emphasizing most importantly the difference between maintenance and prns and under what circumstances the prns are to be triggered using an action plan format where appropriate.  Total time for H and P, chart review, counseling, reviewing hfa device(s) , directly observing portions of ambulatory 02 saturation study/ and generating customized AVS unique to this office visit / same day charting  = 40 min post hosp f/u with pt new to me                    Sandrea Hughs, MD 05/16/2022

## 2022-05-17 ENCOUNTER — Encounter: Payer: Self-pay | Admitting: Internal Medicine

## 2022-05-17 DIAGNOSIS — D509 Iron deficiency anemia, unspecified: Secondary | ICD-10-CM | POA: Diagnosis not present

## 2022-05-17 DIAGNOSIS — L299 Pruritus, unspecified: Secondary | ICD-10-CM | POA: Diagnosis not present

## 2022-05-17 DIAGNOSIS — D631 Anemia in chronic kidney disease: Secondary | ICD-10-CM | POA: Diagnosis not present

## 2022-05-17 DIAGNOSIS — Z992 Dependence on renal dialysis: Secondary | ICD-10-CM | POA: Diagnosis not present

## 2022-05-17 DIAGNOSIS — N186 End stage renal disease: Secondary | ICD-10-CM | POA: Diagnosis not present

## 2022-05-17 DIAGNOSIS — N2581 Secondary hyperparathyroidism of renal origin: Secondary | ICD-10-CM | POA: Diagnosis not present

## 2022-05-17 DIAGNOSIS — R058 Other specified cough: Secondary | ICD-10-CM | POA: Insufficient documentation

## 2022-05-17 DIAGNOSIS — E119 Type 2 diabetes mellitus without complications: Secondary | ICD-10-CM | POA: Diagnosis not present

## 2022-05-17 NOTE — Progress Notes (Unsigned)
Cardiology Office Note Date:  05/18/2022  Patient ID:  Clay, Morgan 22-Mar-1946, MRN [id number] PCP:  Mackie Pai, PA-C  Cardiologist:  Loralie Champagne, MD Electrophysiologist: Virl Axe, MD   Chief Complaint: CRT-D at [facility]  History of Present Illness: Morgan Clay is a 77 y.o. female with PMH notable for NICM, Afib, Aflutter, Atach, not on [place] d/t GI bleed, ESRD on HD; seen today for Virl Axe, MD for routine electrophysiology followup. Since last being seen in our clinic the patient reports doing well.    Saw Dr. Quentin Ore 10/2020 - not watchman candidate d/t ESRD on HD Saw Dr. Curt Bears 10/2021 for ablation discussion - not ablation candidate d/t comorbidities  Last saw Dr. [name] 04/2022, overall doing well. Tolerating HD, mildly SOB with walking distances on flat ground. Weight stable at 126lb.  Today, she has stable SOB, does not think she has extra fluid. Tolerating dialysis well. BP is stable to allow full dialysis sessions.  She continues to have Afib palpitations, but they are also stable.   She is taking 2.'5mg'$  eliquis BID, no current issues with bleeding.   she denies chest pain, palpitations, dyspnea, PND, orthopnea, nausea, vomiting, dizziness, syncope, edema, weight gain, or early satiety.     Device Information: MDT CRT-D, impl 2008, gen change 2012, 2017; dx NICM  AAD History: Amiodarone '200mg'$  daily   Past Medical History:  Diagnosis Date   Angiodysplasia of small intestine 06/10/2020   Ileum - seen on capsule endoscopy 05/2020 - ablated at colonoscopy   Aortic atherosclerosis ([place]) 08/30/2017   Arthritis    "qwhere" (01/03/2018)   Asthma    reports mild asthma since childhood - had COPD on dx list from prior PCP   Biventricular ICD (implantable cardioverter-defibrillator) in place 01/08/2007   Qualifier: Diagnosis of  By: Hassell Done FNP, [place]     Bleeding pseudoaneurysm of left brachiocephalic arteriovenous fistula ([place]) 09/24/2020   Chronic  obstructive pulmonary disease ([place])    Chronic systolic congestive heart failure ([place])    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 (HCV) ([place])    HEPATITIS C - s/p treatment with Harvoni, saw hepatology, Dawn Drazek   Diverticulitis of left colon status post robotic low anterior to sigmoid resection 05/22/2018 07/31/2017   End stage renal disease ([place]) 03/22/2021   Glaucoma    Gout    History of blood transfusion ~ 11/2017   Hypertension associated with diabetes ([place]) 06/07/2017   Hypothyroidism    LBBB (left bundle branch block)    Malnutrition of moderate degree 07/09/2020   MDD (major depressive disorder), recurrent, in partial remission ([place]) 06/07/2017   OBSTRUCTIVE SLEEP APNEA 11/14/2007   no CPAP   OSTEOPENIA 09/30/2008   Persistent atrial fibrillation ([place]) 02/06/2020   Polyneuropathy associated with underlying disease ([place]) 06/09/2021   Pulmonary hypertension ([place]) on echocardiogram 01/14/2018   Secondary esophageal varices without bleeding ([place]) 12/23/2020   Formatting of this note might be different from the original. Patient had trace esophageal varices during small bowel enteroscopy in October 2021, follow-up EGD in January 2022 with no varices.  She previously had GI bleeding thought to be from AVM versus GAVE.  She is no longer on carvedilol and will need surveillance endoscopy.  Patient was previously followed at [facility] and I have asked her    Secondary hypercoagulable state ([place]) 02/06/2020   Type 2 diabetes, controlled, with renal manifestation ([place]) 11/24/2013    Past Surgical History:  Procedure Laterality Date   A/V FISTULAGRAM N/A 03/28/2021  Procedure: A/V FISTULAGRAM;  Surgeon: Waynetta Sandy, MD;  Location: [place] [facility];  Service: Cardiovascular;  Laterality: N/A;   APPENDECTOMY     AV FISTULA PLACEMENT Left 08/05/2020   Procedure: LEFT UPPER EXTREMITY ARTERIOVENOUS (AV) FISTULA CREATION;  Surgeon: Cherre Robins, MD;  Location:  [place];  Service: Vascular;  Laterality: Left;   [place] Left 09/16/2020   Procedure: LEFT SECOND STAGE [place];  Surgeon: Cherre Robins, MD;  Location: [place];  Service: Vascular;  Laterality: Left;  PERIPHERAL NERVE BLOCK   CARDIAC CATHETERIZATION     CARDIOVERSION N/A 12/14/2014   Procedure: CARDIOVERSION;  Surgeon: Dorothy Spark, MD;  Location: [place];  Service: Cardiovascular;  Laterality: N/A;   CARDIOVERSION N/A 02/26/2017   Procedure: CARDIOVERSION;  Surgeon: Evans Lance, MD;  Location: [place] [facility];  Service: Cardiovascular;  Laterality: N/A;   CARDIOVERSION N/A 07/24/2017   Procedure: CARDIOVERSION;  Surgeon: Acie Fredrickson Wonda Cheng, MD;  Location: [facility] ENDOSCOPY;  Service: Cardiovascular;  Laterality: N/A;   CARDIOVERSION N/A 02/12/2020   Procedure: CARDIOVERSION;  Surgeon: [name], MD;  Location: [facility] ENDOSCOPY;  Service: Cardiovascular;  Laterality: N/A;   CARDIOVERSION N/A 09/15/2021   Procedure: CARDIOVERSION;  Surgeon: [name], MD;  Location: [facility]COPY;  Service: Cardiovascular;  Laterality: N/A;   CARPAL TUNNEL RELEASE  07/21/2021   COLONOSCOPY N/A 11/08/2017   Procedure: COLONOSCOPY;  Surgeon: Milus Banister, MD;  Location: [facility] ENDOSCOPY;  Service: Endoscopy;  Laterality: N/A;   COLONOSCOPY WITH PROPOFOL N/A 05/31/2020   Procedure: COLONOSCOPY WITH PROPOFOL;  Surgeon: Irene Shipper, MD;  Location: [facility] ENDOSCOPY;  Service: Endoscopy;  Laterality: N/A;   COLONOSCOPY WITH PROPOFOL N/A 10/26/2021   Procedure: COLONOSCOPY WITH PROPOFOL;  Surgeon: Sharyn Creamer, MD;  Location: [place];  Service: Gastroenterology;  Laterality: N/A;   DILATION AND CURETTAGE OF UTERUS     ENTEROSCOPY N/A 03/02/2020   Procedure: ENTEROSCOPY;  Surgeon: Yetta Flock, MD;  Location: [facility] ENDOSCOPY;  Service: Gastroenterology;  Laterality: N/A;   EP IMPLANTABLE DEVICE N/A 10/06/2015   Procedure: BIV ICD Generator Changeout;   Surgeon: [name], MD;  Location: [place] [facility];  Service: Cardiovascular;  Laterality: N/A;   ESOPHAGOGASTRODUODENOSCOPY N/A 10/26/2017   Procedure: ESOPHAGOGASTRODUODENOSCOPY (EGD);  Surgeon: Ladene Artist, MD;  Location: [facility];  Service: Endoscopy;  Laterality: N/A;   ESOPHAGOGASTRODUODENOSCOPY (EGD) WITH PROPOFOL N/A 11/07/2017   Procedure: ESOPHAGOGASTRODUODENOSCOPY (EGD) WITH PROPOFOL;  Surgeon: Milus Banister, MD;  [facility];  Service: Endoscopy;  Laterality: N/A;   ESOPHAGOGASTRODUODENOSCOPY (EGD) WITH PROPOFOL N/A 05/29/2020   Procedure: ESOPHAGOGASTRODUODENOSCOPY (EGD) WITH PROPOFOL;  Surgeon: Doran Stabler, MD;  Location: [place];  Service: Gastroenterology;  Laterality: N/A;   ESOPHAGOGASTRODUODENOSCOPY (EGD) WITH PROPOFOL N/A 10/26/2021   Procedure: ESOPHAGOGASTRODUODENOSCOPY (EGD) WITH PROPOFOL;  Surgeon: Sharyn Creamer, MD;  Location: [place];  Service: Gastroenterology;  Laterality: N/A;   ESOPHAGOGASTRODUODENOSCOPY (EGD) WITH PROPOFOL N/A 03/08/2022   Procedure: ESOPHAGOGASTRODUODENOSCOPY (EGD) WITH PROPOFOL;  Surgeon: Jackquline Denmark, MD;  Location: [place];  Service: Gastroenterology;  Laterality: N/A;   GIVENS CAPSULE STUDY N/A 05/19/2020   Procedure: GIVENS CAPSULE STUDY;  Surgeon: [name], MD;  Location: [place];  Service: Endoscopy;  Laterality: N/A;  .adm for obs since pacemaker, PA wil enter order and see pt   GIVENS CAPSULE STUDY N/A 05/29/2020   Procedure: GIVENS CAPSULE STUDY;  Surgeon: Doran Stabler, MD;  Location:  [place] ENDOSCOPY;  Service: Gastroenterology;  Laterality: N/A;   HEMATOMA EVACUATION Left 09/24/2020   Procedure: EVACUATION HEMATOMA ARM;  Surgeon: Rosetta Posner, MD;  Location: [facility] OR;  Service: Vascular;  Laterality: Left;   HOT HEMOSTASIS N/A 10/26/2017   Procedure: HOT HEMOSTASIS (ARGON PLASMA COAGULATION/BICAP);  Surgeon: Ladene Artist, MD;  Location: [facility];  Service: Endoscopy;   Laterality: N/A;   HOT HEMOSTASIS N/A 03/02/2020   Procedure: HOT HEMOSTASIS (ARGON PLASMA COAGULATION/BICAP);  Surgeon: Yetta Flock, MD;  Location: [facility] ENDOSCOPY;  Service: Gastroenterology;  Laterality: N/A;   HOT HEMOSTASIS N/A 05/31/2020   Procedure: HOT HEMOSTASIS (ARGON PLASMA COAGULATION/BICAP);  Surgeon: Irene Shipper, MD;  Location: [facility] ENDOSCOPY;  Service: Endoscopy;  Laterality: N/A;   HOT HEMOSTASIS N/A 03/08/2022   Procedure: HOT HEMOSTASIS (ARGON PLASMA COAGULATION/BICAP);  Surgeon: Jackquline Denmark, MD;  Location: [facility] ENDOSCOPY;  Service: Gastroenterology;  Laterality: N/A;   INSERT / REPLACE / REMOVE PACEMAKER  ?2008   IR PERC TUN PERIT CATH WO PORT S&I /IMAG  07/05/2020   PERIPHERAL VASCULAR INTERVENTION Left 03/28/2021   Procedure: PERIPHERAL VASCULAR INTERVENTION;  Surgeon: Waynetta Sandy, MD;  Location: [place] [facility];  Service: Cardiovascular;  Laterality: Left;   POLYPECTOMY  11/08/2017   Procedure: POLYPECTOMY;  Surgeon: Milus Banister, MD;  [facility]OS[facility];  Service: Endoscopy;;   PROCTOSCOPY N/A 05/22/2018   Procedure: RIGID PROCTOSCOPY;  Surgeon: Michael Boston, MD;  [facility];  Service: General;  Laterality: N/A;   RIGHT HEART CATH N/A 02/13/2018   Procedure: RIGHT HEART CATH;  Surgeon: [name], MD;  Location: [place] [facility];  Service: Cardiovascular;  Laterality: N/A;   RIGHT HEART CATH N/A 06/23/2020   Procedure: RIGHT HEART CATH;  Surgeon: [name], MD;  Location: [place] [facility];  Service: Cardiovascular;  Laterality: N/A;   RIGHT/LEFT HEART CATH AND CORONARY ANGIOGRAPHY N/A 12/11/2019   Procedure: RIGHT/LEFT HEART CATH AND CORONARY ANGIOGRAPHY;  Surgeon: [name], MD;  Location: [place] [facility];  Service: Cardiovascular;  Laterality: N/A;   SUBMUCOSAL TATTOO INJECTION  03/02/2020   Procedure: SUBMUCOSAL TATTOO INJECTION;  Surgeon: Yetta Flock, MD;  Location: [facility] ENDOSCOPY;  Service:  Gastroenterology;;   SUBMUCOSAL TATTOO INJECTION  05/31/2020   Procedure: SUBMUCOSAL TATTOO INJECTION;  Surgeon: Irene Shipper, MD;  Location: [place];  Service: Endoscopy;;   TEE WITHOUT CARDIOVERSION N/A 08/02/2021   Procedure: TRANSESOPHAGEAL ECHOCARDIOGRAM (TEE);  Surgeon: [name], MD;  Location: [facility] ENDOSCOPY;  Service: Cardiovascular;  Laterality: N/A;   TUBAL LIGATION     XI ROBOTIC ASSISTED LOWER ANTERIOR RESECTION N/A 05/22/2018   Procedure: XI ROBOTIC ASSISTED SIGMOID COLOECTOMY MOBILIZATION OF SPLENIC FLEXURE, FIREFLY ASSESSMENT OF PERFUSION;  Surgeon: Michael Boston, MD;  [facility];  Service: General;  Laterality: N/A;  ERAS PATHWAY    Current Outpatient Medications  Medication Instructions   albuterol (VENTOLIN HFA) 108 (90 Base) MCG/ACT inhaler 2 puffs, Inhalation, Every 6 hours PRN   amiodarone (PACERONE) 200 mg, Oral, Daily   apixaban (ELIQUIS) 2.5 mg, Oral, 2 times daily   benzonatate (TESSALON) 100 mg, Oral, 3 times daily PRN   cyclobenzaprine (FLEXERIL) 5 mg, Oral, At bedtime PRN   diazepam (VALIUM) 5 MG tablet 1 tab po bid prn anxiety   fluticasone (FLONASE) 50 MCG/ACT nasal spray 2 sprays, Each Nare, Daily   fluticasone-salmeterol (ADVAIR) 250-50 MCG/ACT AEPB 1 puff, Inhalation, 2 times daily   gabapentin (NEURONTIN) 300 mg, Oral, Daily,  Fill one rx. Computer may have sent over 2   HYDROcodone bit-homatropine (HYCODAN) 5-1.5 MG/5ML syrup 5 mLs, Oral, Every 8 hours PRN   hydrOXYzine (ATARAX) 10 MG tablet 1 tab po q hs prn itching   ipratropium (ATROVENT HFA) 17 MCG/ACT inhaler 2 puffs, Inhalation, Every 6 hours PRN   levothyroxine (SYNTHROID) 125 mcg, Oral, Daily before breakfast   lidocaine-prilocaine (EMLA) cream 1 Application, Topical, 3 times weekly   nitroGLYCERIN (NITROSTAT) 0.4 mg, Sublingual, Every 5 min PRN   pantoprazole (PROTONIX) 40 mg, Oral, 2 times daily before meals   rOPINIRole (REQUIP) 0.25 mg, Oral, Daily at bedtime   traZODone  (DESYREL) 25-50 mg, Oral, At bedtime PRN   XIIDRA 5 % SOLN 1 drop, Both Eyes, Daily at bedtime      Social History:  The patient  reports that she has never smoked. She has never used smokeless tobacco. She reports that she does not currently use alcohol. She reports that she does not currently use drugs after having used the following drugs: Marijuana.   ROS:  Please see the history of present illness. All other systems are reviewed and otherwise negative.   PHYSICAL EXAM:  VS:  BP (!) 124/58   Pulse 91   Ht '4\' 11"'$  (1.499 m)   Wt 130 lb 12.8 oz (59.3 kg)   SpO2 98%   BMI 26.42 kg/m  BMI: Body mass index is 26.42 kg/m.  GEN- The patient is well appearing, alert and oriented x 3 today.   HEENT: normocephalic, atraumatic; sclera clear, conjunctiva pink; hearing intact; oropharynx clear; neck supple, no JVP Lungs- Clear to ausculation bilaterally, normal work of breathing.  No wheezes, rales, rhonchi Heart-  Tachy  rate and rhythm, no murmurs, rubs or gallops, PMI not laterally displaced GI- soft, non-tender, non-distended, bowel sounds present, no hepatosplenomegaly Extremities- No peripheral edema. no clubbing or cyanosis; DP/PT/radial pulses 2+ bilaterally MS- no significant deformity or atrophy Skin- warm and dry, no rash or lesion, device pocket well-healed Psych- euthymic mood, full affect Neuro- strength and sensation are intact   Device interrogation done today and reviewed by myself:  Battery at RRT Presents in atrial tachy rhythm; unable to assess atrial lead thresholds Ventricular Lead thresholds, impedence, sensing stable  Multiple, known atrial therapies Optivol low No changes made today  EKG is not ordered.   Recent Labs: 01/10/2022: TSH 5.16 03/16/2022: Magnesium 2.2 03/20/2022: Pro B Natriuretic peptide (BNP) 948.0 03/27/2022: B Natriuretic Peptide 1,685.0 04/07/2022: ALT 11; BUN 29; Creatinine, Ser 6.38; Hemoglobin 11.1; Platelets 175.0; Potassium 4.4; Sodium  140  No results found for requested labs within last 365 days.   CrCl cannot be calculated (Patient's most recent lab result is older than the maximum 21 days allowed.).   Wt Readings from Last 3 Encounters:  05/18/22 130 lb 12.8 oz (59.3 kg)  05/16/22 130 lb 6.4 oz (59.1 kg)  04/25/22 125 lb (56.7 kg)     Additional studies reviewed include: Previous EP, cardiology notes.   TTE 12/22/2021  1. Left ventricular ejection fraction, by estimation, is 50 to 55%. The left ventricle has low normal function. The left ventricle has no regional wall motion abnormalities. There is moderate concentric left ventricular hypertrophy. Left ventricular diastolic parameters are indeterminate.   2. Right ventricular systolic function is normal. The right ventricular size is moderately enlarged. There is normal pulmonary artery systolic pressure.   3. Left atrial size was severely dilated.   4. The mitral valve is normal in structure. Mild  mitral valve regurgitation. No evidence of mitral stenosis.   5. Tricuspid valve regurgitation is moderate to severe.   6. The aortic valve is tricuspid. Aortic valve regurgitation is moderate. No aortic stenosis is present. Aortic regurgitation PHT measures 298 msec.   7. The inferior vena cava is dilated in size with >50% respiratory variability, suggesting right atrial pressure of 8 mmHg.   ASSESSMENT AND PLAN:  #) HFrecEF  diastolic CHF  s/p CRT-D Fluid removal with HD Off coreg and bidil d/t low BP at HD Euvolemic on exam Optivol low on interrogation Due for generator change, unable to schedule procedure on non-HD day Patient will reach out to dialysis center to adjust schedule the week of gen change procedure.   I discussed risks/benefit of the generator removal and reimplantation procedure, to include infection, bleeding. Patient verbalizes understanding and wishes to proceed.   #) parox Afib  atypical Aflutter  A tach Not watchman candidate Not ablation  candidate Continue amiodarone '200mg'$  daily  #) hypercoag d/t AF  H/o GI bleeds No current bleeding concerns CHA2DS2-VASc Score = 5 [CHF History: 1, HTN History: 1, Diabetes History: 1, Stroke History: 0, Vascular Disease History: 0, Age Score: 1, Gender Score: 1].  Therefore, the patient's annual risk of stroke is 7.2 %. [place] - 2.'5mg'$  eliquis BID, appropriately dose reduced d/t weight and Cr   Current medicines are reviewed at length with the patient today.   The patient does not have concerns regarding her medicines.  The following changes were made today:  none  Labs/ tests ordered today include:  Patient will have pre-procedure labs at her HF appointment since it is within 30 days of procedure.  No orders of the defined types were placed in this encounter.    Disposition: Follow up with Dr. Caryl Comes in as usual post procedure   Signed, Mamie Levers, NP  05/18/22 12:05 PM   [place] [address] [place] [address] [phone] (office)  [phone] (fax)

## 2022-05-17 NOTE — Assessment & Plan Note (Signed)
Onset 04/05/22  - 05/16/2022   Walked on RA  x  3  lap(s) =  approx 750  ft  @ nl  pace, stopped due to end of study  with lowest 02 sats 99% and no sob   - 05/16/2022 c/o dry cough likely UACS so rec trial off advair dpi   See UACS (see separate a/p)

## 2022-05-17 NOTE — Assessment & Plan Note (Addendum)
Onset June 2023 p EGD  - try off advair dpi 05/16/2022   Upper airway cough syndrome (previously labeled PNDS),  is so named because it's frequently impossible to sort out how much is  CR/sinusitis with freq throat clearing (which can be related to primary GERD)   vs  causing  secondary (" extra esophageal")  GERD from wide swings in gastric pressure that occur with throat clearing, often  promoting self use of mint and menthol lozenges that reduce the lower esophageal sphincter tone and exacerbate the problem further in a cyclical fashion.   These are the same pts (now being labeled as having "irritable larynx syndrome" by some cough centers) who not infrequently have a history of having failed to tolerate ace inhibitors,  dry powder inhalers(especially advair)  or biphosphonates or report having atypical/extraesophageal reflux symptoms that don't respond to standard doses of PPI  and are easily confused as having aecopd or asthma flares by even experienced allergists/ pulmonologists (myself included).   Will try off advair and continue gerd rx.  If worse can use saba prn:  Re SABA :  I spent extra time with pt today reviewing appropriate use of albuterol for prn use on exertion with the following points: 1) saba is for relief of sob that does not improve by walking a slower pace or resting but rather if the pt does not improve after trying this first. 2) If the pt is convinced, as many are, that saba helps recover from activity faster then it's easy to tell if this is the case by re-challenging : ie stop, take the inhaler, then p 5 minutes try the exact same activity (intensity of workload) that just caused the symptoms and see if they are substantially diminished or not after saba 3) if there is an activity that reproducibly causes the symptoms, try the saba 15 min before the activity on alternate days   If in fact the saba really does help, then fine to continue to use it prn but advised may need to  look closer at the maintenance regimen (for now nothing but low threshold to add back symbicort 80)  being used to achieve better control of airways disease with exertion.  - The proper method of use, as well as anticipated side effects, of a metered-dose inhaler were discussed and demonstrated to the patient using teach back method  and empty hfa device.  Each maintenance medication was reviewed in detail including emphasizing most importantly the difference between maintenance and prns and under what circumstances the prns are to be triggered using an action plan format where appropriate.  Total time for H and P, chart review, counseling, reviewing hfa device(s) , directly observing portions of ambulatory 02 saturation study/ and generating customized AVS unique to this office visit / same day charting  = 40 min post hosp f/u with pt new to me

## 2022-05-18 ENCOUNTER — Ambulatory Visit: Payer: Medicare Other | Attending: Internal Medicine | Admitting: Cardiology

## 2022-05-18 ENCOUNTER — Encounter: Payer: Self-pay | Admitting: Student

## 2022-05-18 VITALS — BP 124/58 | HR 91 | Ht 59.0 in | Wt 130.8 lb

## 2022-05-18 DIAGNOSIS — I482 Chronic atrial fibrillation, unspecified: Secondary | ICD-10-CM

## 2022-05-18 DIAGNOSIS — Z8719 Personal history of other diseases of the digestive system: Secondary | ICD-10-CM | POA: Diagnosis not present

## 2022-05-18 DIAGNOSIS — I5042 Chronic combined systolic (congestive) and diastolic (congestive) heart failure: Secondary | ICD-10-CM | POA: Diagnosis not present

## 2022-05-18 DIAGNOSIS — I484 Atypical atrial flutter: Secondary | ICD-10-CM | POA: Diagnosis not present

## 2022-05-18 DIAGNOSIS — Z95810 Presence of automatic (implantable) cardiac defibrillator: Secondary | ICD-10-CM

## 2022-05-18 DIAGNOSIS — D6869 Other thrombophilia: Secondary | ICD-10-CM

## 2022-05-18 LAB — CUP PACEART INCLINIC DEVICE CHECK
Battery Remaining Longevity: 1 mo
Battery Voltage: 2.71 V
Brady Statistic AP VP Percent: 3.57 %
Brady Statistic AP VS Percent: 0.03 %
Brady Statistic AS VP Percent: 96.18 %
Brady Statistic AS VS Percent: 0.22 %
Brady Statistic RA Percent Paced: 3.59 %
Brady Statistic RV Percent Paced: 99.63 %
Date Time Interrogation Session: 20240111133754
HighPow Impedance: 45 Ohm
HighPow Impedance: 78 Ohm
Implantable Lead Connection Status: 753985
Implantable Lead Connection Status: 753985
Implantable Lead Connection Status: 753985
Implantable Lead Implant Date: 20080818
Implantable Lead Implant Date: 20080818
Implantable Lead Implant Date: 20080818
Implantable Lead Location: 753858
Implantable Lead Location: 753859
Implantable Lead Location: 753860
Implantable Lead Model: 4193
Implantable Lead Model: 5076
Implantable Lead Model: 6947
Implantable Pulse Generator Implant Date: 20170531
Lead Channel Impedance Value: 266 Ohm
Lead Channel Impedance Value: 342 Ohm
Lead Channel Impedance Value: 4047 Ohm
Lead Channel Impedance Value: 4047 Ohm
Lead Channel Impedance Value: 437 Ohm
Lead Channel Impedance Value: 494 Ohm
Lead Channel Pacing Threshold Amplitude: 1 V
Lead Channel Pacing Threshold Amplitude: 1.125 V
Lead Channel Pacing Threshold Amplitude: 1.625 V
Lead Channel Pacing Threshold Pulse Width: 0.4 ms
Lead Channel Pacing Threshold Pulse Width: 0.4 ms
Lead Channel Pacing Threshold Pulse Width: 0.4 ms
Lead Channel Sensing Intrinsic Amplitude: 3.75 mV
Lead Channel Sensing Intrinsic Amplitude: 4.25 mV
Lead Channel Sensing Intrinsic Amplitude: 9.75 mV
Lead Channel Sensing Intrinsic Amplitude: 9.75 mV
Lead Channel Setting Pacing Amplitude: 2 V
Lead Channel Setting Pacing Amplitude: 2.25 V
Lead Channel Setting Pacing Amplitude: 2.5 V
Lead Channel Setting Pacing Pulse Width: 0.4 ms
Lead Channel Setting Pacing Pulse Width: 0.4 ms
Lead Channel Setting Sensing Sensitivity: 0.3 mV
Zone Setting Status: 755011

## 2022-05-18 NOTE — Patient Instructions (Signed)
Medication Instructions:  Your physician recommends that you continue on your current medications as directed. Please refer to the Current Medication list given to you today.  *If you need a refill on your cardiac medications before your next appointment, please call your pharmacy*   Lab Work: None If you have labs (blood work) drawn today and your tests are completely normal, you will receive your results only by: [place] (if you have MyChart) OR A paper copy in the mail If you have any lab test that is abnormal or we need to change your treatment, we will call you to review the results.   Follow-Up: At [facility], you and your health needs are our priority.  As part of our continuing mission to provide you with exceptional heart care, we have created designated Provider Care Teams.  These Care Teams include your primary Cardiologist (physician) and Advanced Practice Providers (APPs -  Physician Assistants and Nurse Practitioners) who all work together to provide you with the care you need, when you need it.   Your next appointment:   We will schedule after your procedure  Other Instructions See Instruction letter for ICD Generator Change

## 2022-05-19 DIAGNOSIS — N186 End stage renal disease: Secondary | ICD-10-CM | POA: Diagnosis not present

## 2022-05-19 DIAGNOSIS — D509 Iron deficiency anemia, unspecified: Secondary | ICD-10-CM | POA: Diagnosis not present

## 2022-05-19 DIAGNOSIS — D631 Anemia in chronic kidney disease: Secondary | ICD-10-CM | POA: Diagnosis not present

## 2022-05-19 DIAGNOSIS — E119 Type 2 diabetes mellitus without complications: Secondary | ICD-10-CM | POA: Diagnosis not present

## 2022-05-19 DIAGNOSIS — N2581 Secondary hyperparathyroidism of renal origin: Secondary | ICD-10-CM | POA: Diagnosis not present

## 2022-05-19 DIAGNOSIS — Z992 Dependence on renal dialysis: Secondary | ICD-10-CM | POA: Diagnosis not present

## 2022-05-19 DIAGNOSIS — L299 Pruritus, unspecified: Secondary | ICD-10-CM | POA: Diagnosis not present

## 2022-05-22 DIAGNOSIS — L299 Pruritus, unspecified: Secondary | ICD-10-CM | POA: Diagnosis not present

## 2022-05-22 DIAGNOSIS — N186 End stage renal disease: Secondary | ICD-10-CM | POA: Diagnosis not present

## 2022-05-22 DIAGNOSIS — Z992 Dependence on renal dialysis: Secondary | ICD-10-CM | POA: Diagnosis not present

## 2022-05-22 DIAGNOSIS — D509 Iron deficiency anemia, unspecified: Secondary | ICD-10-CM | POA: Diagnosis not present

## 2022-05-22 DIAGNOSIS — E119 Type 2 diabetes mellitus without complications: Secondary | ICD-10-CM | POA: Diagnosis not present

## 2022-05-22 DIAGNOSIS — N2581 Secondary hyperparathyroidism of renal origin: Secondary | ICD-10-CM | POA: Diagnosis not present

## 2022-05-22 DIAGNOSIS — D631 Anemia in chronic kidney disease: Secondary | ICD-10-CM | POA: Diagnosis not present

## 2022-05-24 DIAGNOSIS — L299 Pruritus, unspecified: Secondary | ICD-10-CM | POA: Diagnosis not present

## 2022-05-24 DIAGNOSIS — E119 Type 2 diabetes mellitus without complications: Secondary | ICD-10-CM | POA: Diagnosis not present

## 2022-05-24 DIAGNOSIS — D509 Iron deficiency anemia, unspecified: Secondary | ICD-10-CM | POA: Diagnosis not present

## 2022-05-24 DIAGNOSIS — Z992 Dependence on renal dialysis: Secondary | ICD-10-CM | POA: Diagnosis not present

## 2022-05-24 DIAGNOSIS — N186 End stage renal disease: Secondary | ICD-10-CM | POA: Diagnosis not present

## 2022-05-24 DIAGNOSIS — N2581 Secondary hyperparathyroidism of renal origin: Secondary | ICD-10-CM | POA: Diagnosis not present

## 2022-05-24 DIAGNOSIS — D631 Anemia in chronic kidney disease: Secondary | ICD-10-CM | POA: Diagnosis not present

## 2022-05-25 DIAGNOSIS — K4090 Unilateral inguinal hernia, without obstruction or gangrene, not specified as recurrent: Secondary | ICD-10-CM | POA: Diagnosis not present

## 2022-05-26 DIAGNOSIS — D631 Anemia in chronic kidney disease: Secondary | ICD-10-CM | POA: Diagnosis not present

## 2022-05-26 DIAGNOSIS — N186 End stage renal disease: Secondary | ICD-10-CM | POA: Diagnosis not present

## 2022-05-26 DIAGNOSIS — D509 Iron deficiency anemia, unspecified: Secondary | ICD-10-CM | POA: Diagnosis not present

## 2022-05-26 DIAGNOSIS — E119 Type 2 diabetes mellitus without complications: Secondary | ICD-10-CM | POA: Diagnosis not present

## 2022-05-26 DIAGNOSIS — Z992 Dependence on renal dialysis: Secondary | ICD-10-CM | POA: Diagnosis not present

## 2022-05-26 DIAGNOSIS — N2581 Secondary hyperparathyroidism of renal origin: Secondary | ICD-10-CM | POA: Diagnosis not present

## 2022-05-26 DIAGNOSIS — L299 Pruritus, unspecified: Secondary | ICD-10-CM | POA: Diagnosis not present

## 2022-05-29 DIAGNOSIS — N2581 Secondary hyperparathyroidism of renal origin: Secondary | ICD-10-CM | POA: Diagnosis not present

## 2022-05-29 DIAGNOSIS — N186 End stage renal disease: Secondary | ICD-10-CM | POA: Diagnosis not present

## 2022-05-29 DIAGNOSIS — E119 Type 2 diabetes mellitus without complications: Secondary | ICD-10-CM | POA: Diagnosis not present

## 2022-05-29 DIAGNOSIS — D631 Anemia in chronic kidney disease: Secondary | ICD-10-CM | POA: Diagnosis not present

## 2022-05-29 DIAGNOSIS — D509 Iron deficiency anemia, unspecified: Secondary | ICD-10-CM | POA: Diagnosis not present

## 2022-05-29 DIAGNOSIS — Z992 Dependence on renal dialysis: Secondary | ICD-10-CM | POA: Diagnosis not present

## 2022-05-29 DIAGNOSIS — L299 Pruritus, unspecified: Secondary | ICD-10-CM | POA: Diagnosis not present

## 2022-05-31 DIAGNOSIS — N2581 Secondary hyperparathyroidism of renal origin: Secondary | ICD-10-CM | POA: Diagnosis not present

## 2022-05-31 DIAGNOSIS — E119 Type 2 diabetes mellitus without complications: Secondary | ICD-10-CM | POA: Diagnosis not present

## 2022-05-31 DIAGNOSIS — D509 Iron deficiency anemia, unspecified: Secondary | ICD-10-CM | POA: Diagnosis not present

## 2022-05-31 DIAGNOSIS — Z992 Dependence on renal dialysis: Secondary | ICD-10-CM | POA: Diagnosis not present

## 2022-05-31 DIAGNOSIS — N186 End stage renal disease: Secondary | ICD-10-CM | POA: Diagnosis not present

## 2022-05-31 DIAGNOSIS — D631 Anemia in chronic kidney disease: Secondary | ICD-10-CM | POA: Diagnosis not present

## 2022-05-31 DIAGNOSIS — L299 Pruritus, unspecified: Secondary | ICD-10-CM | POA: Diagnosis not present

## 2022-06-01 ENCOUNTER — Encounter: Payer: Self-pay | Admitting: Oncology

## 2022-06-02 DIAGNOSIS — L299 Pruritus, unspecified: Secondary | ICD-10-CM | POA: Diagnosis not present

## 2022-06-02 DIAGNOSIS — D509 Iron deficiency anemia, unspecified: Secondary | ICD-10-CM | POA: Diagnosis not present

## 2022-06-02 DIAGNOSIS — D631 Anemia in chronic kidney disease: Secondary | ICD-10-CM | POA: Diagnosis not present

## 2022-06-02 DIAGNOSIS — E119 Type 2 diabetes mellitus without complications: Secondary | ICD-10-CM | POA: Diagnosis not present

## 2022-06-02 DIAGNOSIS — Z992 Dependence on renal dialysis: Secondary | ICD-10-CM | POA: Diagnosis not present

## 2022-06-02 DIAGNOSIS — N186 End stage renal disease: Secondary | ICD-10-CM | POA: Diagnosis not present

## 2022-06-02 DIAGNOSIS — N2581 Secondary hyperparathyroidism of renal origin: Secondary | ICD-10-CM | POA: Diagnosis not present

## 2022-06-05 ENCOUNTER — Telehealth (HOSPITAL_COMMUNITY): Payer: Self-pay | Admitting: Cardiology

## 2022-06-05 ENCOUNTER — Telehealth: Payer: Self-pay | Admitting: Physician Assistant

## 2022-06-05 DIAGNOSIS — L299 Pruritus, unspecified: Secondary | ICD-10-CM | POA: Diagnosis not present

## 2022-06-05 DIAGNOSIS — N2581 Secondary hyperparathyroidism of renal origin: Secondary | ICD-10-CM | POA: Diagnosis not present

## 2022-06-05 DIAGNOSIS — N186 End stage renal disease: Secondary | ICD-10-CM | POA: Diagnosis not present

## 2022-06-05 DIAGNOSIS — D509 Iron deficiency anemia, unspecified: Secondary | ICD-10-CM | POA: Diagnosis not present

## 2022-06-05 DIAGNOSIS — Z992 Dependence on renal dialysis: Secondary | ICD-10-CM | POA: Diagnosis not present

## 2022-06-05 DIAGNOSIS — D631 Anemia in chronic kidney disease: Secondary | ICD-10-CM | POA: Diagnosis not present

## 2022-06-05 DIAGNOSIS — E119 Type 2 diabetes mellitus without complications: Secondary | ICD-10-CM | POA: Diagnosis not present

## 2022-06-05 NOTE — Telephone Encounter (Signed)
Detailed message left for patient -send transmission

## 2022-06-05 NOTE — Telephone Encounter (Signed)
Pt called to report increase in SOB, Increase in swelling, no energy Pt reports her device is beeping and feels these symptoms are related to device/heart  Will forward to provider Pt also advised to contact EP office to possibly move procedure up sooner

## 2022-06-05 NOTE — Telephone Encounter (Signed)
Attempted to contact pt. No answer

## 2022-06-05 NOTE — Telephone Encounter (Signed)
Inbound call from patient stating that she has been having dark stools again and this morning she seen blood. Patient was offered Ernst Breach next appointment 2/16 and patient stated she could not wait that long to be seen. Patient is requesting a call back to discuss. Please advise.

## 2022-06-05 NOTE — Telephone Encounter (Unsigned)
Left message for pt to call back  °

## 2022-06-06 NOTE — Telephone Encounter (Signed)
Left another message for pt to call back. 06/06/22

## 2022-06-06 NOTE — Telephone Encounter (Signed)
Pt scheduled to see Tye Savoy NP 06/08/22 at 1:30pm. Pt aware of appt.

## 2022-06-06 NOTE — Telephone Encounter (Signed)
PT is returning call, call back requested

## 2022-06-06 NOTE — Telephone Encounter (Signed)
Patient returned your call, requesting a call back.  

## 2022-06-07 ENCOUNTER — Inpatient Hospital Stay (HOSPITAL_BASED_OUTPATIENT_CLINIC_OR_DEPARTMENT_OTHER)
Admission: EM | Admit: 2022-06-07 | Discharge: 2022-06-09 | DRG: 377 | Disposition: A | Discharge status: Home / Self Care | Payer: 59 | Attending: Internal Medicine | Admitting: Internal Medicine

## 2022-06-07 ENCOUNTER — Emergency Department (HOSPITAL_BASED_OUTPATIENT_CLINIC_OR_DEPARTMENT_OTHER): Payer: 59

## 2022-06-07 ENCOUNTER — Other Ambulatory Visit: Payer: Self-pay

## 2022-06-07 ENCOUNTER — Encounter (HOSPITAL_BASED_OUTPATIENT_CLINIC_OR_DEPARTMENT_OTHER): Payer: Self-pay

## 2022-06-07 DIAGNOSIS — K921 Melena: Secondary | ICD-10-CM | POA: Diagnosis not present

## 2022-06-07 DIAGNOSIS — D649 Anemia, unspecified: Secondary | ICD-10-CM | POA: Diagnosis present

## 2022-06-07 DIAGNOSIS — K254 Chronic or unspecified gastric ulcer with hemorrhage: Secondary | ICD-10-CM | POA: Diagnosis present

## 2022-06-07 DIAGNOSIS — K7469 Other cirrhosis of liver: Secondary | ICD-10-CM | POA: Diagnosis not present

## 2022-06-07 DIAGNOSIS — J449 Chronic obstructive pulmonary disease, unspecified: Secondary | ICD-10-CM | POA: Diagnosis present

## 2022-06-07 DIAGNOSIS — N186 End stage renal disease: Secondary | ICD-10-CM | POA: Diagnosis not present

## 2022-06-07 DIAGNOSIS — G63 Polyneuropathy in diseases classified elsewhere: Secondary | ICD-10-CM | POA: Diagnosis present

## 2022-06-07 DIAGNOSIS — Z992 Dependence on renal dialysis: Secondary | ICD-10-CM | POA: Diagnosis not present

## 2022-06-07 DIAGNOSIS — I129 Hypertensive chronic kidney disease with stage 1 through stage 4 chronic kidney disease, or unspecified chronic kidney disease: Secondary | ICD-10-CM | POA: Diagnosis not present

## 2022-06-07 DIAGNOSIS — I130 Hypertensive heart and chronic kidney disease with heart failure and stage 1 through stage 4 chronic kidney disease, or unspecified chronic kidney disease: Secondary | ICD-10-CM | POA: Diagnosis not present

## 2022-06-07 DIAGNOSIS — E1159 Type 2 diabetes mellitus with other circulatory complications: Secondary | ICD-10-CM | POA: Diagnosis not present

## 2022-06-07 DIAGNOSIS — I4819 Other persistent atrial fibrillation: Secondary | ICD-10-CM | POA: Diagnosis not present

## 2022-06-07 DIAGNOSIS — G4733 Obstructive sleep apnea (adult) (pediatric): Secondary | ICD-10-CM | POA: Diagnosis present

## 2022-06-07 DIAGNOSIS — K449 Diaphragmatic hernia without obstruction or gangrene: Secondary | ICD-10-CM | POA: Diagnosis not present

## 2022-06-07 DIAGNOSIS — R0602 Shortness of breath: Secondary | ICD-10-CM | POA: Diagnosis not present

## 2022-06-07 DIAGNOSIS — H409 Unspecified glaucoma: Secondary | ICD-10-CM | POA: Diagnosis not present

## 2022-06-07 DIAGNOSIS — I8510 Secondary esophageal varices without bleeding: Secondary | ICD-10-CM | POA: Diagnosis present

## 2022-06-07 DIAGNOSIS — I5032 Chronic diastolic (congestive) heart failure: Secondary | ICD-10-CM | POA: Diagnosis present

## 2022-06-07 DIAGNOSIS — R9431 Abnormal electrocardiogram [ECG] [EKG]: Secondary | ICD-10-CM | POA: Diagnosis present

## 2022-06-07 DIAGNOSIS — M109 Gout, unspecified: Secondary | ICD-10-CM | POA: Diagnosis present

## 2022-06-07 DIAGNOSIS — E039 Hypothyroidism, unspecified: Secondary | ICD-10-CM | POA: Diagnosis present

## 2022-06-07 DIAGNOSIS — K7460 Unspecified cirrhosis of liver: Secondary | ICD-10-CM | POA: Diagnosis present

## 2022-06-07 DIAGNOSIS — N250 Renal osteodystrophy: Secondary | ICD-10-CM | POA: Diagnosis not present

## 2022-06-07 DIAGNOSIS — E871 Hypo-osmolality and hyponatremia: Secondary | ICD-10-CM | POA: Diagnosis present

## 2022-06-07 DIAGNOSIS — J4489 Other specified chronic obstructive pulmonary disease: Secondary | ICD-10-CM | POA: Diagnosis present

## 2022-06-07 DIAGNOSIS — K31819 Angiodysplasia of stomach and duodenum without bleeding: Secondary | ICD-10-CM | POA: Diagnosis not present

## 2022-06-07 DIAGNOSIS — E1129 Type 2 diabetes mellitus with other diabetic kidney complication: Secondary | ICD-10-CM | POA: Diagnosis present

## 2022-06-07 DIAGNOSIS — I272 Pulmonary hypertension, unspecified: Secondary | ICD-10-CM | POA: Diagnosis not present

## 2022-06-07 DIAGNOSIS — E875 Hyperkalemia: Secondary | ICD-10-CM

## 2022-06-07 DIAGNOSIS — K5521 Angiodysplasia of colon with hemorrhage: Secondary | ICD-10-CM | POA: Diagnosis not present

## 2022-06-07 DIAGNOSIS — Z95810 Presence of automatic (implantable) cardiac defibrillator: Secondary | ICD-10-CM | POA: Diagnosis not present

## 2022-06-07 DIAGNOSIS — E1122 Type 2 diabetes mellitus with diabetic chronic kidney disease: Secondary | ICD-10-CM | POA: Diagnosis not present

## 2022-06-07 DIAGNOSIS — I2510 Atherosclerotic heart disease of native coronary artery without angina pectoris: Secondary | ICD-10-CM | POA: Diagnosis not present

## 2022-06-07 DIAGNOSIS — M898X9 Other specified disorders of bone, unspecified site: Secondary | ICD-10-CM | POA: Diagnosis present

## 2022-06-07 DIAGNOSIS — E1140 Type 2 diabetes mellitus with diabetic neuropathy, unspecified: Secondary | ICD-10-CM | POA: Diagnosis not present

## 2022-06-07 DIAGNOSIS — E1169 Type 2 diabetes mellitus with other specified complication: Secondary | ICD-10-CM | POA: Diagnosis not present

## 2022-06-07 DIAGNOSIS — K31811 Angiodysplasia of stomach and duodenum with bleeding: Secondary | ICD-10-CM | POA: Diagnosis not present

## 2022-06-07 DIAGNOSIS — I152 Hypertension secondary to endocrine disorders: Secondary | ICD-10-CM

## 2022-06-07 DIAGNOSIS — N2581 Secondary hyperparathyroidism of renal origin: Secondary | ICD-10-CM | POA: Diagnosis present

## 2022-06-07 DIAGNOSIS — Z79899 Other long term (current) drug therapy: Secondary | ICD-10-CM

## 2022-06-07 DIAGNOSIS — K922 Gastrointestinal hemorrhage, unspecified: Secondary | ICD-10-CM | POA: Diagnosis not present

## 2022-06-07 DIAGNOSIS — I509 Heart failure, unspecified: Secondary | ICD-10-CM | POA: Diagnosis not present

## 2022-06-07 DIAGNOSIS — I447 Left bundle-branch block, unspecified: Secondary | ICD-10-CM | POA: Diagnosis present

## 2022-06-07 DIAGNOSIS — F3341 Major depressive disorder, recurrent, in partial remission: Secondary | ICD-10-CM | POA: Diagnosis present

## 2022-06-07 DIAGNOSIS — Z7901 Long term (current) use of anticoagulants: Secondary | ICD-10-CM

## 2022-06-07 DIAGNOSIS — K5520 Angiodysplasia of colon without hemorrhage: Secondary | ICD-10-CM | POA: Diagnosis not present

## 2022-06-07 DIAGNOSIS — I132 Hypertensive heart and chronic kidney disease with heart failure and with stage 5 chronic kidney disease, or end stage renal disease: Secondary | ICD-10-CM | POA: Diagnosis present

## 2022-06-07 DIAGNOSIS — I700 Atherosclerosis of aorta: Secondary | ICD-10-CM | POA: Diagnosis not present

## 2022-06-07 DIAGNOSIS — Z825 Family history of asthma and other chronic lower respiratory diseases: Secondary | ICD-10-CM

## 2022-06-07 DIAGNOSIS — D62 Acute posthemorrhagic anemia: Secondary | ICD-10-CM | POA: Diagnosis present

## 2022-06-07 DIAGNOSIS — I5020 Unspecified systolic (congestive) heart failure: Secondary | ICD-10-CM | POA: Diagnosis not present

## 2022-06-07 DIAGNOSIS — Z8249 Family history of ischemic heart disease and other diseases of the circulatory system: Secondary | ICD-10-CM

## 2022-06-07 DIAGNOSIS — K5720 Diverticulitis of large intestine with perforation and abscess without bleeding: Secondary | ICD-10-CM | POA: Diagnosis present

## 2022-06-07 DIAGNOSIS — I5042 Chronic combined systolic (congestive) and diastolic (congestive) heart failure: Secondary | ICD-10-CM | POA: Diagnosis not present

## 2022-06-07 DIAGNOSIS — M8580 Other specified disorders of bone density and structure, unspecified site: Secondary | ICD-10-CM | POA: Diagnosis present

## 2022-06-07 DIAGNOSIS — K219 Gastro-esophageal reflux disease without esophagitis: Secondary | ICD-10-CM | POA: Diagnosis present

## 2022-06-07 DIAGNOSIS — I120 Hypertensive chronic kidney disease with stage 5 chronic kidney disease or end stage renal disease: Secondary | ICD-10-CM | POA: Diagnosis not present

## 2022-06-07 DIAGNOSIS — Z79890 Hormone replacement therapy (postmenopausal): Secondary | ICD-10-CM

## 2022-06-07 LAB — BASIC METABOLIC PANEL
Anion gap: 14 (ref 5–15)
BUN: 47 mg/dL — ABNORMAL HIGH (ref 8–23)
CO2: 26 mmol/L (ref 22–32)
Calcium: 8.5 mg/dL — ABNORMAL LOW (ref 8.9–10.3)
Chloride: 92 mmol/L — ABNORMAL LOW (ref 98–111)
Creatinine, Ser: 7.72 mg/dL — ABNORMAL HIGH (ref 0.44–1.00)
GFR, Estimated: 5 mL/min — ABNORMAL LOW (ref >60)
Glucose, Bld: 106 mg/dL — ABNORMAL HIGH (ref 70–99)
Potassium: 5.7 mmol/L — ABNORMAL HIGH (ref 3.5–5.1)
Sodium: 132 mmol/L — ABNORMAL LOW (ref 135–145)

## 2022-06-07 LAB — CBC WITH DIFFERENTIAL/PLATELET
Abs Immature Granulocytes: 0.1 10*3/uL — ABNORMAL HIGH (ref 0.00–0.07)
Basophils Absolute: 0.1 10*3/uL (ref 0.0–0.1)
Basophils Relative: 1 %
Eosinophils Absolute: 0.2 10*3/uL (ref 0.0–0.5)
Eosinophils Relative: 2 %
HCT: 18.6 % — ABNORMAL LOW (ref 36.0–46.0)
Hemoglobin: 5.9 g/dL — CL (ref 12.0–15.0)
Immature Granulocytes: 1 %
Lymphocytes Relative: 19 %
Lymphs Abs: 1.6 10*3/uL (ref 0.7–4.0)
MCH: 25.2 pg — ABNORMAL LOW (ref 26.0–34.0)
MCHC: 31.7 g/dL (ref 30.0–36.0)
MCV: 79.5 fL — ABNORMAL LOW (ref 80.0–100.0)
Monocytes Absolute: 0.6 10*3/uL (ref 0.1–1.0)
Monocytes Relative: 7 %
Neutro Abs: 6.1 10*3/uL (ref 1.7–7.7)
Neutrophils Relative %: 70 %
Platelets: 221 10*3/uL (ref 150–400)
RBC: 2.34 MIL/uL — ABNORMAL LOW (ref 3.87–5.11)
RDW: 17.9 % — ABNORMAL HIGH (ref 11.5–15.5)
WBC: 8.7 10*3/uL (ref 4.0–10.5)
nRBC: 0 % (ref 0.0–0.2)

## 2022-06-07 LAB — TROPONIN I (HIGH SENSITIVITY)
Troponin I (High Sensitivity): 43 ng/L — ABNORMAL HIGH (ref <18)
Troponin I (High Sensitivity): 45 ng/L — ABNORMAL HIGH (ref <18)

## 2022-06-07 LAB — PREPARE RBC (CROSSMATCH)

## 2022-06-07 LAB — OCCULT BLOOD X 1 CARD TO LAB, STOOL: Fecal Occult Bld: POSITIVE — AB

## 2022-06-07 MED ORDER — SODIUM CHLORIDE 0.9% IV SOLUTION: Freq: Once | INTRAVENOUS | Status: DC

## 2022-06-07 MED ORDER — PANTOPRAZOLE SODIUM 40 MG PO TBEC: 40.0000 mg | DELAYED_RELEASE_TABLET | Freq: Two times a day (BID) | ORAL | Status: DC

## 2022-06-07 MED ORDER — SODIUM CHLORIDE 0.9% FLUSH
3.0000 mL | Freq: Two times a day (BID) | INTRAVENOUS | Status: DC
  Administered 2022-06-08 – 2022-06-09 (×2): 3 mL via INTRAVENOUS

## 2022-06-07 MED ORDER — ALBUTEROL SULFATE (2.5 MG/3ML) 0.083% IN NEBU: 2.5000 mg | INHALATION_SOLUTION | Freq: Four times a day (QID) | RESPIRATORY_TRACT | Status: DC | PRN

## 2022-06-07 MED ORDER — DIAZEPAM 2 MG PO TABS
5.0000 mg | ORAL_TABLET | Freq: Two times a day (BID) | ORAL | Status: DC | PRN
  Administered 2022-06-08 (×2): 5 mg via ORAL
  Filled 2022-06-07 (×2): qty 3

## 2022-06-07 MED ORDER — ACETAMINOPHEN 325 MG PO TABS
650.0000 mg | ORAL_TABLET | Freq: Four times a day (QID) | ORAL | Status: DC | PRN
  Filled 2022-06-07: qty 2

## 2022-06-07 MED ORDER — LEVOTHYROXINE SODIUM 25 MCG PO TABS
125.0000 ug | ORAL_TABLET | Freq: Every day | ORAL | Status: DC
  Administered 2022-06-09: 125 ug via ORAL
  Filled 2022-06-07 (×2): qty 1

## 2022-06-07 MED ORDER — PANTOPRAZOLE SODIUM 40 MG IV SOLR
40.0000 mg | Freq: Once | INTRAVENOUS | Status: AC
  Administered 2022-06-07: 40 mg via INTRAVENOUS
  Filled 2022-06-07: qty 10

## 2022-06-07 MED ORDER — MOMETASONE FURO-FORMOTEROL FUM 200-5 MCG/ACT IN AERO
2.0000 | INHALATION_SPRAY | Freq: Two times a day (BID) | RESPIRATORY_TRACT | Status: DC
  Filled 2022-06-07: qty 8.8

## 2022-06-07 MED ORDER — PANTOPRAZOLE SODIUM 40 MG IV SOLR
40.0000 mg | Freq: Two times a day (BID) | INTRAVENOUS | Status: DC
  Administered 2022-06-08: 40 mg via INTRAVENOUS
  Filled 2022-06-07: qty 10

## 2022-06-07 MED ORDER — AMIODARONE HCL 200 MG PO TABS
200.0000 mg | ORAL_TABLET | Freq: Every day | ORAL | Status: DC
  Administered 2022-06-09: 200 mg via ORAL
  Filled 2022-06-07: qty 1

## 2022-06-07 MED ORDER — GABAPENTIN 300 MG PO CAPS
300.0000 mg | ORAL_CAPSULE | Freq: Every day | ORAL | Status: DC | PRN
  Administered 2022-06-08 (×2): 300 mg via ORAL
  Filled 2022-06-07 (×2): qty 1

## 2022-06-07 MED ORDER — CHLORHEXIDINE GLUCONATE CLOTH 2 % EX PADS: 6.0000 | MEDICATED_PAD | Freq: Every day | CUTANEOUS | Status: DC

## 2022-06-07 MED ORDER — FENTANYL CITRATE PF 50 MCG/ML IJ SOSY
50.0000 ug | PREFILLED_SYRINGE | Freq: Once | INTRAMUSCULAR | Status: AC
  Administered 2022-06-07: 50 ug via INTRAVENOUS
  Filled 2022-06-07: qty 1

## 2022-06-07 MED ORDER — SODIUM ZIRCONIUM CYCLOSILICATE 10 G PO PACK: 10.0000 g | PACK | Freq: Once | ORAL | Status: DC

## 2022-06-07 MED ORDER — ACETAMINOPHEN 650 MG RE SUPP: 650.0000 mg | Freq: Four times a day (QID) | RECTAL | Status: DC | PRN

## 2022-06-07 MED ORDER — ALBUTEROL SULFATE HFA 108 (90 BASE) MCG/ACT IN AERS: 2.0000 | INHALATION_SPRAY | Freq: Four times a day (QID) | RESPIRATORY_TRACT | Status: DC | PRN

## 2022-06-07 NOTE — ED Notes (Addendum)
Abd pain and diarrhea since yesterday states BM s are dark , pt is on eliquis and has dialysis graft to left upper arm, pt is aaox4 pt is M [facility] F dialysis pt

## 2022-06-07 NOTE — Progress Notes (Signed)
NEW ADMISSION NOTE New Admission Note: Patient to floor with abdominal pain, low Hgb and dark stools  Arrival Method: EMS stretcher Mental Orientation: alert and oriented x 4 Telemetry: Assessment: Completed Skin: intact IV:Rt AC Pain: 9/10 in abdomen Tubes:none Safety Measures: Safety Fall Prevention Plan has been given, discussed and implemented. Admission: Completed [facility] Orientation: Patient has been orientated to the room, unit and staff.  Family:  Orders have been reviewed and implemented. Will continue to monitor the patient. Call light has been placed within reach and bed alarm has been activated.   Keenan Bachelor, RN

## 2022-06-07 NOTE — ED Notes (Signed)
Pt given sips of water per request.  Instructed otherwise NPO until admission.  Pt family at bedside. Pt resting comfortably awaiting carelink

## 2022-06-07 NOTE — H&P (Signed)
History and Physical   Morgan Clay PPJ:[id number] DOB: 07-21-1945 DOA: 06/07/2022  PCP: Morgan Pai, PA-C   Patient coming from: Home  Chief Complaint: Rectal bleeding  HPI: Morgan Clay is a 77 y.o. female with medical history significant of diabetes, QTc prolongation, hypertension, neuropathy, A-fib, depression, hepatitis C cirrhosis, gout, ESRD on HD, glaucoma, diverticulosis status post sigmoid resection, COPD, asthma, diastolic CHF, anemia, hypothyroidism, status post biventricular ICD presenting with rectal bleeding.  Patient reports 3 days of black diarrhea and lower abdominal pain with some associated shortness of breath and fatigue.  Has history of prior GI bleeding and has an appointment with her GI provider tomorrow but felt she could not wait.  Last dialysis was Monday, not dialyzed today.  She denies fevers, chills, chest pain, shortness of breath, constipation, nausea, vomiting.  ED Course: Vital signs in the ED significant for blood pressure in the 580D to 983 systolic, heart rate in the 70s to 100s.  Lab workup included CMP with sodium 132, potassium 5.7, chloride 94, BUN 47, creatinine 7.75, glucose 106, calcium 8.5.  CBC showed hemoglobin of 5.9 down from 10 2 months ago.  Microcytic.  Troponin 43 with repeat pending.  FOBT positive.  Urinalysis pending.  Chest x-ray without acute abnormality.  Fentanyl, IV PPI ordered in the ED.  2 units also ordered for transfusion.  GI consulted and are aware of patient and have recommended n.p.o., holding anticoagulation, 2 unit transfusion.  Nephrology notified of patient arrival considering ESRD status and need for multiple units of transfusion.  They recommend starting with 2 units and see how she responds with possibly getting a second unit depending on her response and when she is able to get dialysis here.  Review of Systems: As per HPI otherwise all other systems reviewed and are negative.  Past Medical History:   Diagnosis Date   Angiodysplasia of small intestine 06/10/2020   Ileum - seen on capsule endoscopy 05/2020 - ablated at colonoscopy   Aortic atherosclerosis ([place]) 08/30/2017   Arthritis    "qwhere" (01/03/2018)   Asthma    reports mild asthma since childhood - had COPD on dx list from prior PCP   Biventricular ICD (implantable cardioverter-defibrillator) in place 01/08/2007   Qualifier: Diagnosis of  By: Hassell Done FNP, [place]     Bleeding pseudoaneurysm of left brachiocephalic arteriovenous fistula ([place]) 09/24/2020   Chronic obstructive pulmonary disease ([place])    Chronic systolic congestive heart failure ([place])    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 (HCV) ([place])    HEPATITIS C - s/p treatment with Harvoni, saw hepatology, Dawn Drazek   Diverticulitis of left colon status post robotic low anterior to sigmoid resection 05/22/2018 07/31/2017   End stage renal disease ([place]) 03/22/2021   Glaucoma    Gout    History of blood transfusion ~ 11/2017   Hypertension associated with diabetes ([place]) 06/07/2017   Hypothyroidism    LBBB (left bundle branch block)    Malnutrition of moderate degree 07/09/2020   MDD (major depressive disorder), recurrent, in partial remission ([place]) 06/07/2017   OBSTRUCTIVE SLEEP APNEA 11/14/2007   no CPAP   OSTEOPENIA 09/30/2008   Persistent atrial fibrillation ([place]) 02/06/2020   Polyneuropathy associated with underlying disease ([place]) 06/09/2021   Pulmonary hypertension ([place]) on echocardiogram 01/14/2018   Secondary esophageal varices without bleeding ([place]) 12/23/2020   Formatting of this note might be different from the original. Patient had trace esophageal varices during small bowel enteroscopy in October 2021, follow-up EGD in January  2022 with no varices.  She previously had GI bleeding thought to be from AVM versus GAVE.  She is no longer on carvedilol and will need surveillance endoscopy.  Patient was previously followed at [facility] and I have asked  her    Secondary hypercoagulable state ([place]) 02/06/2020   Type 2 diabetes, controlled, with renal manifestation ([place]) 11/24/2013    Past Surgical History:  Procedure Laterality Date   A/V FISTULAGRAM N/A 03/28/2021   Procedure: A/V FISTULAGRAM;  Surgeon: Waynetta Sandy, MD;  Location: [place] [facility];  Service: Cardiovascular;  Laterality: N/A;   APPENDECTOMY     AV FISTULA PLACEMENT Left 08/05/2020   Procedure: LEFT UPPER EXTREMITY ARTERIOVENOUS (AV) FISTULA CREATION;  Surgeon: Cherre Robins, MD;  Location: [place];  Service: Vascular;  Laterality: Left;   [place] Left 09/16/2020   Procedure: LEFT SECOND STAGE [place];  Surgeon: Cherre Robins, MD;  Location: [place];  Service: Vascular;  Laterality: Left;  PERIPHERAL NERVE BLOCK   CARDIAC CATHETERIZATION     CARDIOVERSION N/A 12/14/2014   Procedure: CARDIOVERSION;  Surgeon: Dorothy Spark, MD;  Location: [place];  Service: Cardiovascular;  Laterality: N/A;   CARDIOVERSION N/A 02/26/2017   Procedure: CARDIOVERSION;  Surgeon: Evans Lance, MD;  Location: [place] [facility];  Service: Cardiovascular;  Laterality: N/A;   CARDIOVERSION N/A 07/24/2017   Procedure: CARDIOVERSION;  Surgeon: Acie Fredrickson Wonda Cheng, MD;  Location[facility] ENDOSCOPY;  Service: Cardiovascular;  Laterality: N/A;   CARDIOVERSION N/A 02/12/2020   Procedure: CARDIOVERSION;  Surgeon: [name], MD;  Location: [facility] ENDOSCOPY;  Service: Cardiovascular;  Laterality: N/A;   CARDIOVERSION N/A 09/15/2021   Procedure: CARDIOVERSION;  Surgeon: [name], MD;  Location: [facility] ENDOSCOPY;  Service: Cardiovascular;  Laterality: N/A;   CARPAL TUNNEL RELEASE  07/21/2021   COLONOSCOPY N/A 11/08/2017   Procedure: COLONOSCOPY;  Surgeon: Milus Banister, MD;  [facility];  Service: Endoscopy;  Laterality: N/A;   COLONOSCOPY WITH PROPOFOL N/A 05/31/2020   Procedure: COLONOSCOPY WITH PROPOFOL;  Surgeon: Irene Shipper, MD;  Location: [facility] ENDOSCOPY;  Service: Endoscopy;  Laterality: N/A;   COLONOSCOPY WITH PROPOFOL N/A 10/26/2021   Procedure: COLONOSCOPY WITH PROPOFOL;  Surgeon: Sharyn Creamer, MD;  Location: [place];  Service: Gastroenterology;  Laterality: N/A;   DILATION AND CURETTAGE OF UTERUS     ENTEROSCOPY N/A 03/02/2020   Procedure: ENTEROSCOPY;  Surgeon: Yetta Flock, MD;  Location: [facility] ENDOSCOPY;  Service: Gastroenterology;  Laterality: N/A;   EP IMPLANTABLE DEVICE N/A 10/06/2015   Procedure: BIV ICD Generator Changeout;  Surgeon: [name], MD;  Location: [place] [facility];  Service: Cardiovascular;  Laterality: N/A;   ESOPHAGOGASTRODUODENOSCOPY N/A 10/26/2017   Procedure: ESOPHAGOGASTRODUODENOSCOPY (EGD);  Surgeon: Ladene Artist, MD;  [facility];  Service: Endoscopy;  Laterality: N/A;   ESOPHAGOGASTRODUODENOSCOPY (EGD) WITH PROPOFOL N/A 11/07/2017   Procedure: ESOPHAGOGASTRODUODENOSCOPY (EGD) WITH PROPOFOL;  Surgeon: Milus Banister, MD;  [facility];  Service: Endoscopy;  Laterality: N/A;   ESOPHAGOGASTRODUODENOSCOPY (EGD) WITH PROPOFOL N/A 05/29/2020   Procedure: ESOPHAGOGASTRODUODENOSCOPY (EGD) WITH PROPOFOL;  Surgeon: Doran Stabler, MD;  Location: [place];  Service: Gastroenterology;  Laterality: N/A;   ESOPHAGOGASTRODUODENOSCOPY (EGD) WITH PROPOFOL N/A 10/26/2021   Procedure: ESOPHAGOGASTRODUODENOSCOPY (EGD) WITH PROPOFOL;  Surgeon: Sharyn Creamer, MD;  Location: [place];  Service: Gastroenterology;  Laterality: N/A;   ESOPHAGOGASTRODUODENOSCOPY (EGD) WITH PROPOFOL N/A 03/08/2022   Procedure: ESOPHAGOGASTRODUODENOSCOPY (EGD) WITH PROPOFOL;  Surgeon: Jackquline Denmark, MD;  Location: [facility] ENDOSCOPY;  Service: Gastroenterology;  Laterality: N/A;   GIVENS CAPSULE STUDY N/A 05/19/2020   Procedure: GIVENS CAPSULE STUDY;  Surgeon: [name], MD;  Location: [place];  Service: Endoscopy;  Laterality: N/A;  .adm for obs since pacemaker, PA  wil enter order and see pt   GIVENS CAPSULE STUDY N/A 05/29/2020   Procedure: GIVENS CAPSULE STUDY;  Surgeon: Doran Stabler, MD;  Location: [place];  Service: Gastroenterology;  Laterality: N/A;   HEMATOMA EVACUATION Left 09/24/2020   Procedure: EVACUATION HEMATOMA ARM;  Surgeon: Rosetta Posner, MD;  Location: [facility] OR;  Service: Vascular;  Laterality: Left;   HOT HEMOSTASIS N/A 10/26/2017   Procedure: HOT HEMOSTASIS (ARGON PLASMA COAGULATION/BICAP);  Surgeon: Ladene Artist, MD;  Location: [facility];  Service: Endoscopy;  Laterality: N/A;   HOT HEMOSTASIS N/A 03/02/2020   Procedure: HOT HEMOSTASIS (ARGON PLASMA COAGULATION/BICAP);  Surgeon: Yetta Flock, MD;  Location: [facility] ENDOSCOPY;  Service: Gastroenterology;  Laterality: N/A;   HOT HEMOSTASIS N/A 05/31/2020   Procedure: HOT HEMOSTASIS (ARGON PLASMA COAGULATION/BICAP);  Surgeon: Irene Shipper, MD;  Location: [facility] ENDOSCOPY;  Service: Endoscopy;  Laterality: N/A;   HOT HEMOSTASIS N/A 03/08/2022   Procedure: HOT HEMOSTASIS (ARGON PLASMA COAGULATION/BICAP);  Surgeon: Jackquline Denmark, MD;  Location: [facility] ENDOSCOPY;  Service: Gastroenterology;  Laterality: N/A;   INSERT / REPLACE / REMOVE PACEMAKER  ?2008   IR PERC TUN PERIT CATH WO PORT S&I /IMAG  07/05/2020   PERIPHERAL VASCULAR INTERVENTION Left 03/28/2021   Procedure: PERIPHERAL VASCULAR INTERVENTION;  Surgeon: Waynetta Sandy, MD;  Location: [place] [facility];  Service: Cardiovascular;  Laterality: Left;   POLYPECTOMY  11/08/2017   Procedure: POLYPECTOMY;  Surgeon: Milus Banister, MD;  [facility];  Service: Endoscopy;;   PROCTOSCOPY N/A 05/22/2018   Procedure: RIGID PROCTOSCOPY;  Surgeon: Michael [place], MD;  [facility];  Service: General;  Laterality: N/A;   RIGHT HEART CATH N/A 02/13/2018   Procedure: RIGHT HEART CATH;  Surgeon: [name], MD;  Location: [place] [facility];  Service: Cardiovascular;  Laterality: N/A;   RIGHT HEART CATH N/A  06/23/2020   Procedure: RIGHT HEART CATH;  Surgeon: [name], MD;  Location: [place] [facility];  Service: Cardiovascular;  Laterality: N/A;   RIGHT/LEFT HEART CATH AND CORONARY ANGIOGRAPHY N/A 12/11/2019   Procedure: RIGHT/LEFT HEART CATH AND CORONARY ANGIOGRAPHY;  Surgeon: [name], MD;  Location: [place] [facility];  Service: Cardiovascular;  Laterality: N/A;   SUBMUCOSAL TATTOO INJECTION  03/02/2020   Procedure: SUBMUCOSAL TATTOO INJECTION;  Surgeon: Yetta Flock, MD;  Location: [facility] ENDOSCOPY;  Service: Gastroenterology;;   SUBMUCOSAL TATTOO INJECTION  05/31/2020   Procedure: SUBMUCOSAL TATTOO INJECTION;  Surgeon: Irene Shipper, MD;  Location: [place];  Service: Endoscopy;;   TEE WITHOUT CARDIOVERSION N/A 08/02/2021   Procedure: TRANSESOPHAGEAL ECHOCARDIOGRAM (TEE);  Surgeon: [name], MD;  Location: [facility] ENDOSCOPY;  Service: Cardiovascular;  Laterality: N/A;   TUBAL LIGATION     XI ROBOTIC ASSISTED LOWER ANTERIOR RESECTION N/A 05/22/2018   Procedure: XI ROBOTIC ASSISTED SIGMOID COLOECTOMY MOBILIZATION OF SPLENIC FLEXURE, FIREFLY ASSESSMENT OF PERFUSION;  Surgeon: Michael [place], MD;  [facility];  Service: General;  Laterality: N/A;  ERAS PATHWAY    Social History  reports that she has never smoked. She has never used smokeless tobacco. She reports that she does not currently use alcohol. She reports that she does not currently  use drugs after having used the following drugs: Marijuana.  No Known Allergies  Family History  Problem Relation Age of Onset   Asthma Father    Heart attack Father    Asthma Sister    Lung cancer Sister    Heart attack Mother    Stroke Brother    Colon cancer Neg Hx    Esophageal cancer Neg Hx    Pancreatic cancer Neg Hx    Stomach cancer Neg Hx    Liver disease Neg Hx   Reviewed on admission.  Prior to Admission medications   Medication Sig Start Date End Date Taking? Authorizing Provider  albuterol (VENTOLIN  HFA) 108 (90 Base) MCG/ACT inhaler Inhale 2 puffs into the lungs every 6 (six) hours as needed for wheezing or shortness of breath. 04/07/22   Hunsucker, Bonna Gains, MD  amiodarone (PACERONE) 200 MG tablet Take 1 tablet (200 mg total) by mouth daily. 11/10/21   Saguier, Percell Miller, PA-C  apixaban (ELIQUIS) 2.5 MG TABS tablet Take 1 tablet (2.5 mg total) by mouth 2 (two) times daily. 03/11/22   Pokhrel, Corrie Mckusick, MD  benzonatate (TESSALON) 100 MG capsule Take 1 capsule (100 mg total) by mouth 3 (three) times daily as needed for cough. 03/20/22   Saguier, Percell Miller, PA-C  cyclobenzaprine (FLEXERIL) 5 MG tablet Take 5 mg by mouth at bedtime as needed for muscle spasms. 12/07/21   [provider]  diazepam (VALIUM) 5 MG tablet 1 tab po bid prn anxiety Patient taking differently: Take 5 mg by mouth 2 (two) times daily as needed for anxiety. 01/19/22   Saguier, Percell Miller, PA-C  fluticasone (FLONASE) 50 MCG/ACT nasal spray Place 2 sprays into both nostrils daily. 04/06/22 04/06/23  Saguier, Percell Miller, PA-C  fluticasone-salmeterol (ADVAIR) 250-50 MCG/ACT AEPB Inhale 1 puff into the lungs in the morning and at bedtime. 03/29/22   Ghimire, Henreitta Leber, MD  gabapentin (NEURONTIN) 300 MG capsule Take 1 capsule (300 mg total) by mouth daily. Fill one rx. Computer may have sent over 2 Patient taking differently: Take 300 mg by mouth daily as needed (pain). 01/10/22   Saguier, Percell Miller, PA-C  HYDROcodone bit-homatropine (HYCODAN) 5-1.5 MG/5ML syrup Take 5 mLs by mouth every 8 (eight) hours as needed for cough. 03/24/22   Saguier, Percell Miller, PA-C  hydrOXYzine (ATARAX) 10 MG tablet 1 tab po q hs prn itching Patient taking differently: Take 10 mg by mouth at bedtime as needed for itching. 02/07/22   Saguier, Percell Miller, PA-C  ipratropium (ATROVENT HFA) 17 MCG/ACT inhaler Inhale 2 puffs into the lungs every 6 (six) hours as needed for wheezing. 03/24/22   Saguier, Percell Miller, PA-C  levothyroxine (SYNTHROID) 125 MCG tablet Take 1 tablet (125 mcg total)  by mouth daily before breakfast. 01/16/22   Saguier, Percell Miller, PA-C  lidocaine-prilocaine (EMLA) cream Apply 1 Application topically 3 (three) times a week. 09/08/21   [provider]  nitroGLYCERIN (NITROSTAT) 0.4 MG SL tablet Place 1 tablet (0.4 mg total) under the tongue every 5 (five) minutes as needed for chest pain. 10/27/21   Thurnell Lose, MD  pantoprazole (PROTONIX) 40 MG tablet Take 1 tablet (40 mg total) by mouth 2 (two) times daily before a meal. 03/09/22   Pokhrel, Laxman, MD  rOPINIRole (REQUIP) 0.25 MG tablet Take 0.25 mg by mouth at bedtime. 12/08/21   [provider]  traZODone (DESYREL) 50 MG tablet Take 0.5-1 tablets (25-50 mg total) by mouth at bedtime as needed for sleep. 12/16/20   Saguier, Percell Miller, PA-C  XIIDRA 5 %  SOLN Place 1 drop into both eyes at bedtime. 11/23/21   [provider]    Physical Exam: Vitals:   06/07/22 1300 06/07/22 1415 06/07/22 1500 06/07/22 1700  BP: (!) 142/59 136/62 (!) 138/53 (!) 131/33  Pulse: 79 83 82 (!) 101  Resp: '15 16 19 18  '$ Temp: 97.6 F (36.4 C)   97.8 F (36.6 C)  TempSrc:    Oral  SpO2: 100% 90% 95% 100%  Weight:      Height:        Physical Exam Constitutional:      General: She is not in acute distress.    Appearance: Normal appearance.  HENT:     Head: Normocephalic and atraumatic.     Mouth/Throat:     Mouth: Mucous membranes are moist.     Pharynx: Oropharynx is clear.  Eyes:     Extraocular Movements: Extraocular movements intact.     Pupils: Pupils are equal, round, and reactive to light.  Cardiovascular:     Rate and Rhythm: Normal rate and regular rhythm.     Pulses: Normal pulses.     Heart sounds: Normal heart sounds.  Pulmonary:     Effort: Pulmonary effort is normal. No respiratory distress.     Breath sounds: Normal breath sounds.  Abdominal:     General: Bowel sounds are normal. There is no distension.     Palpations: Abdomen is soft.     Tenderness: There is no abdominal  tenderness.  Musculoskeletal:        General: No swelling or deformity.  Skin:    General: Skin is warm and dry.  Neurological:     General: No focal deficit present.     Mental Status: Mental status is at baseline.     Labs on Admission: I have personally reviewed following labs and imaging studies  CBC: Recent Labs  Lab 06/07/22 1307  WBC 8.7  NEUTROABS 6.1  HGB 5.9*  HCT 18.6*  MCV 79.5*  PLT 657    Basic Metabolic Panel: Recent Labs  Lab 06/07/22 1307  NA 132*  K 5.7*  CL 92*  CO2 26  GLUCOSE 106*  BUN 47*  CREATININE 7.72*  CALCIUM 8.5*    GFR: Estimated Creatinine Clearance: 5 mL/min (A) (by C-G formula based on SCr of 7.72 mg/dL (H)).  Liver Function Tests: No results for input(s): "AST", "ALT", "ALKPHOS", "BILITOT", "PROT", "ALBUMIN" in the last 168 hours.  Urine analysis:    Component Value Date/Time   COLORURINE YELLOW 03/05/2018 0902   APPEARANCEUR CLEAR 03/05/2018 0902   LABSPEC 1.008 03/05/2018 0902   PHURINE 5.0 03/05/2018 0902   GLUCOSEU NEGATIVE 03/05/2018 0902   HGBUR NEGATIVE 03/05/2018 0902   HGBUR negative 02/28/2008 0000   BILIRUBINUR NEGATIVE 03/05/2018 0902   KETONESUR 5 (A) 03/05/2018 0902   PROTEINUR NEGATIVE 03/05/2018 0902   UROBILINOGEN 1.0 07/19/2010 1752   NITRITE NEGATIVE 03/05/2018 0902   LEUKOCYTESUR NEGATIVE 03/05/2018 0902    Radiological Exams on Admission: DG Chest Port 1 View  Result Date: 06/07/2022 CLINICAL DATA:  Shortness of breath. EXAM: PORTABLE CHEST 1 VIEW COMPARISON:  May 16, 2022. FINDINGS: Stable cardiomegaly. Left-sided defibrillator is unchanged in position. Both lungs are clear. The visualized skeletal structures are unremarkable. IMPRESSION: No active disease. Electronically Signed   By: Marijo Conception M.D.   On: 06/07/2022 13:05    EKG: Independently reviewed.  Paced rhythm at 80 bpm.  QTc 546.  Assessment/Plan Principal Problem:   Acute  upper GI bleeding Active Problems:   Acquired  hypothyroidism   Chronic diastolic CHF (congestive heart failure) (HCC)   Biventricular ICD (implantable cardioverter-defibrillator) in place   Type 2 diabetes, controlled, with renal manifestation (HCC)   Chronic obstructive pulmonary disease ([place])   Hypertension associated with diabetes ([place])   MDD (major depressive disorder), recurrent, in partial remission ([place])   Diverticulitis of left colon status post robotic low anterior to sigmoid resection 05/22/2018   Anemia   Gout   Persistent atrial fibrillation (HCC)   ESRD on dialysis ([place])   Hepatic cirrhosis (HCC)   Polyneuropathy associated with underlying disease (HCC)   Glaucoma   Prolonged QT interval   GI bleeding > Presenting with 3 days of rectal bleeding with black diarrhea and lower abdominal pain.  Hemoglobin of 5.9 down from 10 2 months ago.  Also with associated shortness of breath and fatigue.  > History of prior GI bleeding, diverticulosis status post sigmoid resection, varices in the setting of hepatitis C cirrhosis as below. > 2 units of been ordered but is starting with 1 unit for now given HD patient and unclear when patient will be able to receive dialysis. > GI consulted and will see the patient recommending n.p.o. and transfusion, also recommending nephrology evaluation.  Did receive IV PPI in the ED. - Appreciate GI recommendations - Type and screen stat, since arrival from med center - Continue with 1 unit transfusion for now, 2 units of been ordered but nurse notified that we will await further nephrology input for second unit - NPO, sips with meds, ice chips for now - Hold home Eliquis - IV PPI twice daily for now  ESRD on HD Hyperkalemia > Last dialysis session was Monday.  No dialysis today.  Mildly elevated potassium of 5.7. > Patient not yet dialyzed today due to evaluation for her GI bleeding as above.  Also with need for transfusion may need additional units done with dialysis. > Nephrology consulted and  recommend starting with 1 unit transfusion and they will see the patient for further evaluation and determine timing for dialysis.  May need additional units transfused pending response, 2 units have been ordered as above. - Appreciate nephrology recommendations - Dialysis per nephrology - Lokelma 10 mg x 1 per nephrology recommendations - Continue to trend renal function and electrolytes  Diastolic CHF Recovered ejection fraction > History of combined systolic and diastolic CHF now with diastolic CHF alone > Last echo in 2023 with EF 50-55%, indeterminate diastolic function, normal RV function. > Does have history of pacemaker, defibrillator in place > Wall removal by dialysis.  Not on beta-blocker or other antihypertensive due to hypotension with dialysis. - Continue to monitor  QTc prolongation > History of this and QTc remains prolonged at 546. - Avoid QTc prolonging medications when able - EKG in a.m.   Atrial fibrillation > History of A-fib and a flutter.  Not a candidate for ablation. - Continue home amiodarone - Holding home anticoagulation  Hepatitis C cirrhosis > Compensated hepatitis C cirrhosis.  Normal LFTs.  Does have history of varice, though no varices noted on endoscopy in 2023. Hep C has been treated. - Noted, continue to monitor  Neuropathy - Continue home gabapentin  COPD Asthma - Continue home albuterol as needed  Hypothyroidism - Continue home Synthroid  DVT prophylaxis: SCDs Code Status:   Full Family Communication:  None on admission  Disposition Plan:   Patient is from:  Home  Anticipated DC to:  Home  Anticipated DC date:  2 to 4 days  Anticipated DC barriers: None  Consults called:  Gastroenterology, nephrology Admission status:  Inpatient, telemetry  Severity of Illness: The appropriate patient status for this patient is INPATIENT. Inpatient status is judged to be reasonable and necessary in order to provide the required intensity of  service to ensure the patient's safety. The patient's presenting symptoms, physical exam findings, and initial radiographic and laboratory data in the context of their chronic comorbidities is felt to place them at high risk for further clinical deterioration. Furthermore, it is not anticipated that the patient will be medically stable for discharge from the hospital within 2 midnights of admission.   * I certify that at the point of admission it is my clinical judgment that the patient will require inpatient hospital care spanning beyond 2 midnights from the point of admission due to high intensity of service, high risk for further deterioration and high frequency of surveillance required.Marcelyn Bruins MD [facility]  How to contact the [facility] Attending or Consulting provider [place] or covering provider during after hours [place], for this patient?   Check the care team in [facility] and look for a) attending/consulting [facility] provider listed and b) the [facility] team listed Log into [email or web address] and use [place]'s universal password to access. If you do not have the password, please contact the hospital operator. Locate the [facility] provider you are looking for under [facility] and page to a number that you can be directly reached. If you still have difficulty reaching the provider, please page the [facility] (Director on Call) for the Hospitalists listed on amion for assistance.  06/07/2022, 5:56 PM

## 2022-06-07 NOTE — Progress Notes (Deleted)
NEW ADMISSION NOTE New Admission Note:   Arrival Method:  Mental Orientation:  Telemetry: Assessment: Completed Skin: IV: Pain: Tubes: Safety Measures: Safety Fall Prevention Plan has been given, discussed and implemented. Admission: Completed [facility] [place] Orientation: Patient has been orientated to the room, unit and staff.  Family:  Orders have been reviewed and implemented. Will continue to monitor the patient. Call light has been placed within reach and bed alarm has been activated.   Keenan Bachelor, RN

## 2022-06-07 NOTE — Telephone Encounter (Signed)
Patient was admitted into the hospital today FYI for GI bleed

## 2022-06-07 NOTE — Consult Note (Signed)
Progress Note for Accepted Transfer   Patient: Morgan Clay VOZ:[id number] DOB: [birth year] PCP: Mackie Pai, PA-C DOA: 06/07/2022 DOS: the patient was seen and examined on 06/07/2022 Primary service: Hayden Rasmussen, MD  Referring physician: Melina Copa Reason for consult:  Morgan Clay is a 77 y.o. female with past medical history of ESRD on MWF HD; DM; afib on Eliquis; chronic systolic CHF with AICD placement; COPD; Hep C cirrhosis; glaucoma; HTN; hypothyroidism; GI bleeding; and OSA presenting with abdominal pain, rectal bleeding x 3-4 days, on Eliquis..Anemia from chronic GI bleeding.  Has ESRD on MWF HD, afib with ACID on Eliquis.  Sees [facility] for AVMs and bleeding.  A few days of dark diarrhea, increased fatigue.  Hemodynamically stable.  Hgb 5.9, down from 11.  K+ 5.7, EKG ok, due for HD.   No telemetry beds are available at this time, recommend ER:ER transfer at this time.   I have encouraged her to be transferred ER:ER to initiate blood transfusion and arrange for HD.  At this time, based on hyperkalemia she will need admission to telemetry.   Author: Karmen Bongo, MD 06/07/2022 2:25 PM  For on call review [email or web address].

## 2022-06-07 NOTE — Consult Note (Signed)
[facility] Renal Consultation Note    Indication for Consultation:  Management of ESRD/hemodialysis; anemia, hypertension/volume and secondary hyperparathyroidism  HPI: Morgan Clay is a 77 y.o. female with a PMH significant for ESRD, HFrEF with AICD, CAD, A-fib/flutter, T2DM, GERD, OSA, Hep C cirrhosis, T2DM and h/o GIB with AVM's and diverticular bleeding who was admitted as transfer from [place] with 3-4 days of weakness, fatigue, abdominal pain and "black stools".    In the ED, she was afebrile and vital signs were stable.  Labs were notable for Na 132, K 5.7, Cl 92, BUN 47, Cr 7.72, Hgb 5.9.  She was admitted for blood transfusion and GI evaluation.  We were consulted to provide dialysis since she had missed her session this morning.  She did have some SOB and chest tightness earlier today but that has resolved.  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 ([place]) 09/24/2020   Chronic obstructive pulmonary disease ([place])    Chronic systolic congestive heart failure ([place])    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 (HCV) ([place])    HEPATITIS C - s/p treatment with Harvoni, saw hepatology, Dawn Drazek   Diverticulitis of left colon status post robotic low anterior to sigmoid resection 05/22/2018 07/31/2017   End stage renal disease ([place]) 03/22/2021   Glaucoma    Gout    History of blood transfusion ~ 11/2017   Hypertension associated with diabetes ([place]) 06/07/2017   Hypothyroidism     LBBB (left bundle branch block)    Malnutrition of moderate degree 07/09/2020   MDD (major depressive disorder), recurrent, in partial remission ([place]) 06/07/2017   OBSTRUCTIVE SLEEP APNEA 11/14/2007   no CPAP   OSTEOPENIA 09/30/2008   Persistent atrial fibrillation ([place]) 02/06/2020   Polyneuropathy associated with underlying disease ([place]) 06/09/2021   Pulmonary hypertension ([place]) on echocardiogram 01/14/2018   Secondary esophageal varices without bleeding ([place]) 12/23/2020   Formatting of this note might be different from the original. Patient had trace esophageal varices during small bowel enteroscopy in October 2021, follow-up EGD in January 2022 with no varices.  She previously had GI bleeding thought to be from AVM versus GAVE.  She is no longer on carvedilol and will need surveillance endoscopy.  Patient was previously followed at [facility] and I have asked her    Secondary hypercoagulable state ([place]) 02/06/2020   Type 2 diabetes, controlled, with renal manifestation ([place]) 11/24/2013   Past Surgical History:  Procedure Laterality Date   A/V FISTULAGRAM N/A 03/28/2021   Procedure: A/V FISTULAGRAM;  Surgeon: Waynetta Sandy, MD;  Location: [place] [facility];  Service: Cardiovascular;  Laterality: N/A;   APPENDECTOMY     AV FISTULA PLACEMENT Left 08/05/2020   Procedure: LEFT UPPER EXTREMITY ARTERIOVENOUS (AV) FISTULA CREATION;  Surgeon: Cherre Robins, MD;  Location: [place];  Service: Vascular;  Laterality: Left;   [place] Left 09/16/2020   Procedure: LEFT SECOND STAGE [place];  Surgeon: Cherre Robins, MD;  Location: [place];  Service: Vascular;  Laterality: Left;  PERIPHERAL NERVE BLOCK   CARDIAC CATHETERIZATION     CARDIOVERSION N/A 12/14/2014   Procedure: CARDIOVERSION;  Surgeon: Dorothy Spark, MD;  Location: [place];  Service: Cardiovascular;  Laterality: N/A;   CARDIOVERSION N/A 02/26/2017   Procedure: CARDIOVERSION;   Surgeon: Evans Lance, MD;  Location: [place] [facility];  Service: Cardiovascular;  Laterality: N/A;   CARDIOVERSION N/A 07/24/2017   Procedure: CARDIOVERSION;  Surgeon: Acie Fredrickson Wonda Cheng, MD;  Location: [facility] ENDOSCOPY;  Service: Cardiovascular;  Laterality: N/A;   CARDIOVERSION N/A 02/12/2020   Procedure: CARDIOVERSION;  Surgeon: [name], MD;  Location: [facility] ENDOSCOPY;  Service: Cardiovascular;  Laterality: N/A;   CARDIOVERSION N/A 09/15/2021   Procedure: CARDIOVERSION;  Surgeon: [name], MD;  Location: [facility] ENDOSCOPY;  Service: Cardiovascular;  Laterality: N/A;   CARPAL TUNNEL RELEASE  07/21/2021   COLONOSCOPY N/A 11/08/2017   Procedure: COLONOSCOPY;  Surgeon: Milus Banister, MD;  Location: [facility] ENDOSCOPY;  Service: Endoscopy;  Laterality: N/A;   COLONOSCOPY WITH PROPOFOL N/A 05/31/2020   Procedure: COLONOSCOPY WITH PROPOFOL;  Surgeon: Irene Shipper, MD;  Location: [facility] ENDOSCOPY;  Service: Endoscopy;  Laterality: N/A;   COLONOSCOPY WITH PROPOFOL N/A 10/26/2021   Procedure: COLONOSCOPY WITH PROPOFOL;  Surgeon: Sharyn Creamer, MD;  Location: [place];  Service: Gastroenterology;  Laterality: N/A;   DILATION AND CURETTAGE OF UTERUS     ENTEROSCOPY N/A 03/02/2020   Procedure: ENTEROSCOPY;  Surgeon: Yetta Flock, MD;  Location: [facility] ENDOSCOPY;  Service: Gastroenterology;  Laterality: N/A;   EP IMPLANTABLE DEVICE N/A 10/06/2015   Procedure: BIV ICD Generator Changeout;  Surgeon: [name], MD;  Location: [place] [facility];  Service: Cardiovascular;  Laterality: N/A;   ESOPHAGOGASTRODUODENOSCOPY N/A 10/26/2017   Procedure: ESOPHAGOGASTRODUODENOSCOPY (EGD);  Surgeon: Ladene Artist, MD;  Location[facility];  Service: Endoscopy;  Laterality: N/A;   ESOPHAGOGASTRODUODENOSCOPY (EGD) WITH PROPOFOL N/A 11/07/2017   Procedure: ESOPHAGOGASTRODUODENOSCOPY (EGD) WITH PROPOFOL;  Surgeon: Milus Banister, MD;  [facility];  Service: Endoscopy;  Laterality: N/A;    ESOPHAGOGASTRODUODENOSCOPY (EGD) WITH PROPOFOL N/A 05/29/2020   Procedure: ESOPHAGOGASTRODUODENOSCOPY (EGD) WITH PROPOFOL;  Surgeon: Doran Stabler, MD;  Location: [place];  Service: Gastroenterology;  Laterality: N/A;   ESOPHAGOGASTRODUODENOSCOPY (EGD) WITH PROPOFOL N/A 10/26/2021   Procedure: ESOPHAGOGASTRODUODENOSCOPY (EGD) WITH PROPOFOL;  Surgeon: Sharyn Creamer, MD;  Location: [place];  Service: Gastroenterology;  Laterality: N/A;   ESOPHAGOGASTRODUODENOSCOPY (EGD) WITH PROPOFOL N/A 03/08/2022   Procedure: ESOPHAGOGASTRODUODENOSCOPY (EGD) WITH PROPOFOL;  Surgeon: Jackquline Denmark, MD;  Location: [place];  Service: Gastroenterology;  Laterality: N/A;   GIVENS CAPSULE STUDY N/A 05/19/2020   Procedure: GIVENS CAPSULE STUDY;  Surgeon: [name], MD;  Location: [place];  Service: Endoscopy;  Laterality: N/A;  .adm for obs since pacemaker, PA wil enter order and see pt   GIVENS CAPSULE STUDY N/A 05/29/2020   Procedure: GIVENS CAPSULE STUDY;  Surgeon: Doran Stabler, MD;  Location: [place];  Service: Gastroenterology;  Laterality: N/A;   HEMATOMA EVACUATION Left 09/24/2020   Procedure: EVACUATION HEMATOMA ARM;  Surgeon: Rosetta Posner, MD;  [facility] OR;  Service: Vascular;  Laterality: Left;   HOT HEMOSTASIS N/A 10/26/2017   Procedure: HOT HEMOSTASIS (ARGON PLASMA COAGULATION/BICAP);  Surgeon: Ladene Artist, MD;  Location: [facility];  Service: Endoscopy;  Laterality: N/A;   HOT HEMOSTASIS N/A 03/02/2020   Procedure: HOT HEMOSTASIS (ARGON PLASMA COAGULATION/BICAP);  Surgeon: Yetta Flock, MD;  Location: [facility] ENDOSCOPY;  Service: Gastroenterology;  Laterality: N/A;   HOT HEMOSTASIS N/A 05/31/2020   Procedure: HOT HEMOSTASIS (ARGON PLASMA COAGULATION/BICAP);  Surgeon: Irene Shipper, MD;  Location: [facility] ENDOSCOPY;  Service: Endoscopy;  Laterality: N/A;   HOT HEMOSTASIS N/A 03/08/2022   Procedure: HOT HEMOSTASIS (ARGON PLASMA COAGULATION/BICAP);  Surgeon:  Jackquline Denmark, MD;  Location: [facility] ENDOSCOPY;  Service: Gastroenterology;  Laterality: N/A;   INSERT / REPLACE / REMOVE PACEMAKER  ?2008   IR PERC TUN PERIT CATH WO PORT S&I /IMAG  07/05/2020   PERIPHERAL VASCULAR INTERVENTION Left 03/28/2021   Procedure: PERIPHERAL VASCULAR INTERVENTION;  Surgeon: Waynetta Sandy, MD;  Location: [place] [facility];  Service: Cardiovascular;  Laterality: Left;   POLYPECTOMY  11/08/2017   Procedure: POLYPECTOMY;  Surgeon: Milus Banister, MD;  [facility];  Service: Endoscopy;;   PROCTOSCOPY N/A 05/22/2018   Procedure: RIGID PROCTOSCOPY;  Surgeon: Michael Boston, MD;  [facility];  Service: General;  Laterality: N/A;   RIGHT HEART CATH N/A 02/13/2018   Procedure: RIGHT HEART CATH;  Surgeon: [name], MD;  Location: [place] [facility];  Service: Cardiovascular;  Laterality: N/A;   RIGHT HEART CATH N/A 06/23/2020   Procedure: RIGHT HEART CATH;  Surgeon: [name], MD;  Location: [place] [facility];  Service: Cardiovascular;  Laterality: N/A;   RIGHT/LEFT HEART CATH AND CORONARY ANGIOGRAPHY N/A 12/11/2019   Procedure: RIGHT/LEFT HEART CATH AND CORONARY ANGIOGRAPHY;  Surgeon: [name], MD;  Location: [place] [facility];  Service: Cardiovascular;  Laterality: N/A;   SUBMUCOSAL TATTOO INJECTION  03/02/2020   Procedure: SUBMUCOSAL TATTOO INJECTION;  Surgeon: Yetta Flock, MD;  Location: [facility] ENDOSCOPY;  Service: Gastroenterology;;   SUBMUCOSAL TATTOO INJECTION  05/31/2020   Procedure: SUBMUCOSAL TATTOO INJECTION;  Surgeon: Irene Shipper, MD;  Location: [place];  Service: Endoscopy;;   TEE WITHOUT CARDIOVERSION N/A 08/02/2021   Procedure: TRANSESOPHAGEAL ECHOCARDIOGRAM (TEE);  Surgeon: [name], MD;  Location: [facility] ENDOSCOPY;  Service: Cardiovascular;  Laterality: N/A;   TUBAL LIGATION     XI ROBOTIC ASSISTED LOWER ANTERIOR RESECTION N/A 05/22/2018   Procedure: XI ROBOTIC ASSISTED SIGMOID COLOECTOMY  MOBILIZATION OF SPLENIC FLEXURE, FIREFLY ASSESSMENT OF PERFUSION;  Surgeon: Michael Boston, MD;  [facility];  Service: General;  Laterality: N/A;  ERAS PATHWAY   Family History:   Family History  Problem Relation Age of Onset   Asthma Father    Heart attack Father    Asthma Sister    Lung cancer Sister    Heart attack Mother    Stroke Brother    Colon cancer Neg Hx    Esophageal cancer Neg Hx    Pancreatic cancer Neg Hx    Stomach cancer Neg Hx    Liver disease Neg Hx    Social History:  reports that she has never smoked. She has never used smokeless tobacco. She reports that she does not currently use alcohol. She reports that she does not currently use drugs after having used the following drugs: Marijuana. No Known Allergies Prior to Admission medications   Medication Sig Start Date End Date Taking? Authorizing Provider  albuterol (VENTOLIN HFA) 108 (90 Base) MCG/ACT inhaler Inhale 2 puffs into the lungs every 6 (six) hours as needed for wheezing or shortness of breath. 04/07/22   Hunsucker, Bonna Gains, MD  amiodarone (PACERONE) 200 MG tablet Take 1 tablet (200 mg total) by mouth daily. 11/10/21   Saguier, Percell Miller, PA-C  apixaban (  ELIQUIS) 2.5 MG TABS tablet Take 1 tablet (2.5 mg total) by mouth 2 (two) times daily. 03/11/22   Pokhrel, Corrie Mckusick, MD  benzonatate (TESSALON) 100 MG capsule Take 1 capsule (100 mg total) by mouth 3 (three) times daily as needed for cough. 03/20/22   Saguier, Percell Miller, PA-C  cyclobenzaprine (FLEXERIL) 5 MG tablet Take 5 mg by mouth at bedtime as needed for muscle spasms. 12/07/21   [provider]  diazepam (VALIUM) 5 MG tablet 1 tab po bid prn anxiety Patient taking differently: Take 5 mg by mouth 2 (two) times daily as needed for anxiety. 01/19/22   Saguier, Percell Miller, PA-C  fluticasone (FLONASE) 50 MCG/ACT nasal spray Place 2 sprays into both nostrils daily. 04/06/22 04/06/23  Saguier, Percell Miller, PA-C  fluticasone-salmeterol (ADVAIR) 250-50 MCG/ACT AEPB  Inhale 1 puff into the lungs in the morning and at bedtime. 03/29/22   Ghimire, Henreitta Leber, MD  gabapentin (NEURONTIN) 300 MG capsule Take 1 capsule (300 mg total) by mouth daily. Fill one rx. Computer may have sent over 2 Patient taking differently: Take 300 mg by mouth daily as needed (pain). 01/10/22   Saguier, Percell Miller, PA-C  HYDROcodone bit-homatropine (HYCODAN) 5-1.5 MG/5ML syrup Take 5 mLs by mouth every 8 (eight) hours as needed for cough. 03/24/22   Saguier, Percell Miller, PA-C  hydrOXYzine (ATARAX) 10 MG tablet 1 tab po q hs prn itching Patient taking differently: Take 10 mg by mouth at bedtime as needed for itching. 02/07/22   Saguier, Percell Miller, PA-C  ipratropium (ATROVENT HFA) 17 MCG/ACT inhaler Inhale 2 puffs into the lungs every 6 (six) hours as needed for wheezing. 03/24/22   Saguier, Percell Miller, PA-C  levothyroxine (SYNTHROID) 125 MCG tablet Take 1 tablet (125 mcg total) by mouth daily before breakfast. 01/16/22   Saguier, Percell Miller, PA-C  lidocaine-prilocaine (EMLA) cream Apply 1 Application topically 3 (three) times a week. 09/08/21   [provider]  nitroGLYCERIN (NITROSTAT) 0.4 MG SL tablet Place 1 tablet (0.4 mg total) under the tongue every 5 (five) minutes as needed for chest pain. 10/27/21   Thurnell Lose, MD  pantoprazole (PROTONIX) 40 MG tablet Take 1 tablet (40 mg total) by mouth 2 (two) times daily before a meal. 03/09/22   Pokhrel, Laxman, MD  rOPINIRole (REQUIP) 0.25 MG tablet Take 0.25 mg by mouth at bedtime. 12/08/21   [provider]  traZODone (DESYREL) 50 MG tablet Take 0.5-1 tablets (25-50 mg total) by mouth at bedtime as needed for sleep. 12/16/20   Saguier, Percell Miller, PA-C  XIIDRA 5 % SOLN Place 1 drop into both eyes at bedtime. 11/23/21   [provider]   Current Facility-Administered Medications  Medication Dose Route Frequency Provider Last Rate Last Admin   0.9 %  sodium chloride infusion (Manually program via Guardrails IV Fluids)   Intravenous Once Marcelyn Bruins, MD       acetaminophen (TYLENOL) tablet 650 mg  650 mg Oral Q6H PRN Marcelyn Bruins, MD       Or   acetaminophen (TYLENOL) suppository 650 mg  650 mg Rectal Q6H PRN Marcelyn Bruins, MD       [START ON 06/08/2022] amiodarone (PACERONE) tablet 200 mg  200 mg Oral Daily Marcelyn Bruins, MD       [START ON 06/08/2022] Chlorhexidine Gluconate Cloth 2 % PADS 6 each  6 each Topical Q0600 Donato Heinz, MD       [START ON 06/08/2022] levothyroxine (SYNTHROID) tablet 125 mcg  125 mcg Oral QAC breakfast Trilby Drummer,  Candace Gallus, MD       mometasone-formoterol [facility]) 200-5 MCG/ACT inhaler 2 puff  2 puff Inhalation BID Marcelyn Bruins, MD       pantoprazole (PROTONIX) injection 40 mg  40 mg Intravenous Q12H Marcelyn Bruins, MD       sodium chloride flush (NS) 0.9 % injection 3 mL  3 mL Intravenous Q12H Marcelyn Bruins, MD       sodium zirconium cyclosilicate (LOKELMA) packet 10 g  10 g Oral Once Marcelyn Bruins, MD       Labs: Basic Metabolic Panel: Recent Labs  Lab 06/07/22 1307  NA 132*  K 5.7*  CL 92*  CO2 26  GLUCOSE 106*  BUN 47*  CREATININE 7.72*  CALCIUM 8.5*   Liver Function Tests: No results for input(s): "AST", "ALT", "ALKPHOS", "BILITOT", "PROT", "ALBUMIN" in the last 168 hours. No results for input(s): "LIPASE", "AMYLASE" in the last 168 hours. No results for input(s): "AMMONIA" in the last 168 hours. CBC: Recent Labs  Lab 06/07/22 1307  WBC 8.7  NEUTROABS 6.1  HGB 5.9*  HCT 18.6*  MCV 79.5*  PLT 221   Cardiac Enzymes: No results for input(s): "CKTOTAL", "CKMB", "CKMBINDEX", "TROPONINI" in the last 168 hours. CBG: No results for input(s): "GLUCAP" in the last 168 hours. Iron Studies: No results for input(s): "IRON", "TIBC", "TRANSFERRIN", "FERRITIN" in the last 72 hours. Studies/Results: DG Chest Port 1 View  Result Date: 06/07/2022 CLINICAL DATA:  Shortness of breath. EXAM: PORTABLE CHEST 1 VIEW COMPARISON:  May 16, 2022.  FINDINGS: Stable cardiomegaly. Left-sided defibrillator is unchanged in position. Both lungs are clear. The visualized skeletal structures are unremarkable. IMPRESSION: No active disease. Electronically Signed   By: Marijo Conception M.D.   On: 06/07/2022 13:05    ROS: Pertinent items are noted in HPI. Physical Exam: Vitals:   06/07/22 1300 06/07/22 1415 06/07/22 1500 06/07/22 1700  BP: (!) 142/59 136/62 (!) 138/53 (!) 131/33  Pulse: 79 83 82 (!) 101  Resp: '15 16 19 18  '$ Temp: 97.6 F (36.4 C)   97.8 F (36.6 C)  TempSrc:    Oral  SpO2: 100% 90% 95% 100%  Weight:      Height:          Weight change:  No intake or output data in the 24 hours ending 06/07/22 1806 BP (!) 131/33   Pulse (!) 101   Temp 97.8 F (36.6 C) (Oral)   Resp 18   Ht '4\' 11"'$  (1.499 m)   Wt 59 kg   SpO2 100%   BMI 26.26 kg/m  General appearance: fatigued and no distress Head: Normocephalic, without obvious abnormality, atraumatic Resp: clear to auscultation bilaterally Cardio: tachy at 101, no rub GI: +BS, soft, diffusely tender to palpation, no guarding or rebound Extremities: extremities normal, atraumatic, no cyanosis or edema and LUE AVF +T/B Dialysis Access:  Dialysis Orders: Center:  [facility]  on MWF . EDW 56kg HD Bath 2K/2Ca  Time 3:30 Heparin none. Access LUE AVF BFR 400 DFR 600    Micera 225 mcg IVP (given 06/05/22) Hectoral 4 mcg IVP TIW Sensipar 30 mg with HD TIW Korsuva 0.6 mL IVP tiw  Assessment/Plan:  ABLA - heme + with symptomatic anemia and Hgb 5.9.  Has history of AVM's and diverticular bleeds.  Transfuse 1 unit PRBC's now and the second on HD.  Will need GI consult in am.   ESRD -  plan for HD as soon as able given  hyperkalemia and need for more blood products.  No heparin.  Hypertension/volume  - will UF 2-3 liters as bp tolerates  Anemia  - as above  Metabolic bone disease -  continue with home meds and renal diet  Nutrition - renal diet, carb modified.  Hyperkalemia - lokelma 10 grams  x 1 and plan for HD tonight.  Afib - on Eliquis and amiodarone.  Would hold Eliquis for now. Hep C cirrhosis - s/p treatment S/p AICD  Donetta Potts, MD [place] 06/07/2022, 6:06 PM

## 2022-06-07 NOTE — ED Notes (Signed)
[facility] at bedside to transport pt to [facility]  

## 2022-06-07 NOTE — Progress Notes (Signed)
   06/07/22 2016  Vitals  Temp 98.3 F (36.8 C)  Pulse Rate 79 (Simultaneous filing. User may not have seen previous data.)  Resp 18 (Simultaneous filing. User may not have seen previous data.)  BP (!) 114/52  SpO2 99 % (Simultaneous filing. User may not have seen previous data.)  O2 Device Room Air  Weight 60.3 kg  Type of Weight Pre-Dialysis  Pre Treatment  Is pt a NEW START this admission?  No  What is patient's outpatient schedule? MWF  Vascular access used during treatment Fistula  Patient is receiving dialysis in a chair No  Hemodialysis Consent Verified Yes  Hemodialysis Standing Orders Initiated Yes  ECG (Telemetry) Monitor On Yes  Prime Ordered Normal Saline  Length of  DialysisTreatment -hour(s) 3.5 Hour(s)  Dialysis mode HD  Dialysis Treatment Comments Pt AxO, stable to begin HD TX  Dialyzer Revaclear 400  Dialysate 2K  Dialysate Flow Ordered 300  Blood Flow Rate Ordered 400 mL/min  Ultrafiltration Goal 3000 Liters  Blood Products Ordered Packed Red Blood Cells  Dialysis Blood Pressure Support Ordered Normal Saline

## 2022-06-07 NOTE — ED Triage Notes (Addendum)
Pt c/o abdominal pain, nausea, and rectal bleeding x3-4 days.  Pain score 8/10.  Pt reports taking Eliquis.  When EDP entered the room, Pt began c/o SOB and chest tightness. Also, her pacemaker has been beeping.

## 2022-06-07 NOTE — ED Provider Notes (Signed)
[place] [facility] Provider Note   CSN: [id number] Arrival date & time: 06/07/22  1232     History  Chief Complaint  Patient presents with   Rectal Bleeding    Morgan Clay is a 77 y.o. female.  She has a history of end-stage renal disease, a flutter, CHF, pacemaker.  She is here with a complaint of black diarrhea, lower abdominal pain, shortness of breath and fatigue that is been going on for the last 3 days.  She has a history of GI bleeds sees [name] and has an appointment tomorrow but did not feel she could wait.  She is on blood thinners.  She denies any fevers or chills.  She has had some tightness in her chest.  No cough.  Nausea no vomiting.  She also tells me that her pacemaker has been beeping.  The history is provided by the patient.  Rectal Bleeding Quality:  Black and tarry Amount:  Moderate Duration:  4 days Timing:  Intermittent Chronicity:  Recurrent Context: defecation   Similar prior episodes: yes   Relieved by:  None tried Worsened by:  Nothing Ineffective treatments:  None tried Associated symptoms: abdominal pain   Associated symptoms: no fever, no hematemesis, no loss of consciousness and no vomiting   Risk factors: anticoagulant use and hx of colorectal surgery        Home Medications Prior to Admission medications   Medication Sig Start Date End Date Taking? Authorizing Provider  albuterol (VENTOLIN HFA) 108 (90 Base) MCG/ACT inhaler Inhale 2 puffs into the lungs every 6 (six) hours as needed for wheezing or shortness of breath. 04/07/22   Hunsucker, Bonna Gains, MD  amiodarone (PACERONE) 200 MG tablet Take 1 tablet (200 mg total) by mouth daily. 11/10/21   Saguier, Percell Miller, PA-C  apixaban (ELIQUIS) 2.5 MG TABS tablet Take 1 tablet (2.5 mg total) by mouth 2 (two) times daily. 03/11/22   Pokhrel, Corrie Mckusick, MD  benzonatate (TESSALON) 100 MG capsule Take 1 capsule (100 mg total) by mouth 3 (three) times daily as  needed for cough. 03/20/22   Saguier, Percell Miller, PA-C  cyclobenzaprine (FLEXERIL) 5 MG tablet Take 5 mg by mouth at bedtime as needed for muscle spasms. 12/07/21   [provider]  diazepam (VALIUM) 5 MG tablet 1 tab po bid prn anxiety Patient taking differently: Take 5 mg by mouth 2 (two) times daily as needed for anxiety. 01/19/22   Saguier, Percell Miller, PA-C  fluticasone (FLONASE) 50 MCG/ACT nasal spray Place 2 sprays into both nostrils daily. 04/06/22 04/06/23  Saguier, Percell Miller, PA-C  fluticasone-salmeterol (ADVAIR) 250-50 MCG/ACT AEPB Inhale 1 puff into the lungs in the morning and at bedtime. 03/29/22   Ghimire, Henreitta Leber, MD  gabapentin (NEURONTIN) 300 MG capsule Take 1 capsule (300 mg total) by mouth daily. Fill one rx. Computer may have sent over 2 Patient taking differently: Take 300 mg by mouth daily as needed (pain). 01/10/22   Saguier, Percell Miller, PA-C  HYDROcodone bit-homatropine (HYCODAN) 5-1.5 MG/5ML syrup Take 5 mLs by mouth every 8 (eight) hours as needed for cough. 03/24/22   Saguier, Percell Miller, PA-C  hydrOXYzine (ATARAX) 10 MG tablet 1 tab po q hs prn itching Patient taking differently: Take 10 mg by mouth at bedtime as needed for itching. 02/07/22   Saguier, Percell Miller, PA-C  ipratropium (ATROVENT HFA) 17 MCG/ACT inhaler Inhale 2 puffs into the lungs every 6 (six) hours as needed for wheezing. 03/24/22   Saguier, Percell Miller, PA-C  levothyroxine (  SYNTHROID) 125 MCG tablet Take 1 tablet (125 mcg total) by mouth daily before breakfast. 01/16/22   Saguier, Percell Miller, PA-C  lidocaine-prilocaine (EMLA) cream Apply 1 Application topically 3 (three) times a week. 09/08/21   [provider]  nitroGLYCERIN (NITROSTAT) 0.4 MG SL tablet Place 1 tablet (0.4 mg total) under the tongue every 5 (five) minutes as needed for chest pain. 10/27/21   Thurnell Lose, MD  pantoprazole (PROTONIX) 40 MG tablet Take 1 tablet (40 mg total) by mouth 2 (two) times daily before a meal. 03/09/22   Pokhrel, Laxman, MD   rOPINIRole (REQUIP) 0.25 MG tablet Take 0.25 mg by mouth at bedtime. 12/08/21   [provider]  traZODone (DESYREL) 50 MG tablet Take 0.5-1 tablets (25-50 mg total) by mouth at bedtime as needed for sleep. 12/16/20   Saguier, Percell Miller, PA-C  XIIDRA 5 % SOLN Place 1 drop into both eyes at bedtime. 11/23/21   [provider]      Allergies    Patient has no known allergies.    Review of Systems   Review of Systems  Constitutional:  Positive for fatigue. Negative for fever.  Respiratory:  Positive for chest tightness and shortness of breath.   Cardiovascular:  Negative for chest pain.  Gastrointestinal:  Positive for abdominal pain, hematochezia and nausea. Negative for hematemesis and vomiting.  Neurological:  Negative for loss of consciousness.    Physical Exam Updated Vital Signs BP (!) 152/56 (BP Location: Right Arm)   Pulse 84   Temp 97.6 F (36.4 C) (Oral)   Resp 18   Ht '4\' 11"'$  (1.499 m)   Wt 59 kg   SpO2 100%   BMI 26.26 kg/m  Physical Exam Vitals and nursing note reviewed.  Constitutional:      General: She is not in acute distress.    Appearance: Normal appearance. She is well-developed.  HENT:     Head: Normocephalic and atraumatic.  Eyes:     Conjunctiva/sclera: Conjunctivae normal.  Cardiovascular:     Rate and Rhythm: Normal rate and regular rhythm.     Heart sounds: Murmur heard.  Pulmonary:     Effort: Pulmonary effort is normal. No respiratory distress.     Breath sounds: Normal breath sounds.  Abdominal:     Palpations: Abdomen is soft.     Tenderness: There is abdominal tenderness (Diffuse lower abdominal tenderness). There is no guarding or rebound.  Musculoskeletal:     Cervical back: Neck supple.     Right lower leg: No edema.     Left lower leg: No edema.  Skin:    General: Skin is warm and dry.     Capillary Refill: Capillary refill takes less than 2 seconds.  Neurological:     General: No focal deficit present.     Mental  Status: She is alert.     ED Results / Procedures / Treatments   Labs (all labs ordered are listed, but only abnormal results are displayed) Labs Reviewed  BASIC METABOLIC PANEL - Abnormal; Notable for the following components:      Result Value   Sodium 132 (*)    Potassium 5.7 (*)    Chloride 92 (*)    Glucose, Bld 106 (*)    BUN 47 (*)    Creatinine, Ser 7.72 (*)    Calcium 8.5 (*)    GFR, Estimated 5 (*)    All other components within normal limits  CBC WITH DIFFERENTIAL/PLATELET - Abnormal; Notable for  the following components:   RBC 2.34 (*)    Hemoglobin 5.9 (*)    HCT 18.6 (*)    MCV 79.5 (*)    MCH 25.2 (*)    RDW 17.9 (*)    Abs Immature Granulocytes 0.10 (*)    All other components within normal limits  OCCULT BLOOD X 1 CARD TO LAB, STOOL - Abnormal; Notable for the following components:   Fecal Occult Bld POSITIVE (*)    All other components within normal limits  TROPONIN I (HIGH SENSITIVITY) - Abnormal; Notable for the following components:   Troponin I (High Sensitivity) 43 (*)    All other components within normal limits  URINALYSIS, ROUTINE W REFLEX MICROSCOPIC  TYPE AND SCREEN  PREPARE RBC (CROSSMATCH)  TROPONIN I (HIGH SENSITIVITY)    EKG EKG Interpretation  Date/Time:  Wednesday June 07 2022 13:02:22 EST Ventricular Rate:  80 PR Interval:  127 QRS Duration: 145 QT Interval:  473 QTC Calculation: 546 R Axis:   268 Text Interpretation: AV PACED RHYTHM No significant change since prior 11/23 Confirmed by Aletta Edouard [id number]) on 06/07/2022 1:04:05 PM  Radiology DG Chest Port 1 View  Result Date: 06/07/2022 CLINICAL DATA:  Shortness of breath. EXAM: PORTABLE CHEST 1 VIEW COMPARISON:  May 16, 2022. FINDINGS: Stable cardiomegaly. Left-sided defibrillator is unchanged in position. Both lungs are clear. The visualized skeletal structures are unremarkable. IMPRESSION: No active disease. Electronically Signed   By: Marijo Conception M.D.   On:  06/07/2022 13:05    Procedures .Critical Care  Performed by: Hayden Rasmussen, MD Authorized by: Hayden Rasmussen, MD   Critical care provider statement:    Critical care time (minutes):  45   Critical care time was exclusive of:  Separately billable procedures and treating other patients   Critical care was necessary to treat or prevent imminent or life-threatening deterioration of the following conditions:  Circulatory failure   Critical care was time spent personally by me on the following activities:  Development of treatment plan with patient or surrogate, discussions with consultants, evaluation of patient's response to treatment, examination of patient, obtaining history from patient or surrogate, ordering and performing treatments and interventions, ordering and review of laboratory studies, ordering and review of radiographic studies, pulse oximetry and re-evaluation of patient's condition   I assumed direction of critical care for this patient from another provider in my specialty: no       Medications Ordered in ED Medications  0.9 %  sodium chloride infusion (Manually program via Guardrails IV Fluids) (has no administration in time range)  pantoprazole (PROTONIX) injection 40 mg (40 mg Intravenous Given 06/07/22 1311)  fentaNYL (SUBLIMAZE) injection 50 mcg (50 mcg Intravenous Given 06/07/22 1311)    ED Course/ Medical Decision Making/ A&P Clinical Course as of 06/07/22 1438  Wed Jun 07, 2022  1305 Chest x-ray interpreted by me as cardiomegaly AICD in place.  Awaiting radiology reading. [MB]  1337 Rectal exam done with nurse Jenny Reichmann as chaperone.  Normal tone no masses.  Sent to lab for guaiac. [MB]  5176 Received a call from lab that her hemoglobin was 5.9.  Would initiate transfusion but unfortunately do not have routine transfusion capabilities here.  Will review with GI and work on medicine admission to [facility] due to her end-stage renal disease. [MB]  [place]  message to [place] who will have team evaluate patient when she arrives at [facility]. [MB]  1607 Discussed with Dr. Lorin Mercy  [facility] hospitalist.  There are no anticipated beds anytime soon and she is not can to get a transfusion here so she is asking for the patient to be transferred ED to ED so the hospitalist team can pick her up sooner and get the transfusion started. [MB]  3734 Patient accepted in transfer by Dr. Maryan Rued to the ED at [facility]. [MB]    Clinical Course User Index [MB] Hayden Rasmussen, MD                             Medical Decision Making Amount and/or Complexity of Data Reviewed Labs: ordered. Radiology: ordered.  Risk Prescription drug management. Decision regarding hospitalization.   This patient complains of chest pain shortness of breath dark stools; this involves an extensive number of treatment Options and is a complaint that carries with it a high risk of complications and morbidity. The differential includes GI bleed, symptomatic anemia, ACS, perforation  I ordered, reviewed and interpreted labs, which included CBC with critically low hemoglobin, chemistries with elevated potassium elevated BUN and creatinine consistent with her end-stage renal disease, fecal occult positive, troponins elevated need to be trended I ordered medication IV fluids IV PPI IV pain medication and reviewed PMP when indicated. I ordered imaging studies which included chest x-ray and I independently    visualized and interpreted imaging which showed no acute findings Additional history obtained from patient's family member Previous records obtained and reviewed in epic including recent GI notes and discharge summary I consulted [facility] and [facility] hospitalist Dr. Lorin Mercy and discussed lab and imaging findings and discussed disposition.  Cardiac monitoring reviewed, paced rhythm Social determinants considered, no significant barriers Critical Interventions: Workup and treatment  of patient's symptomatic anemia requiring transfer to another facility to initiate transfusion  After the interventions stated above, I reevaluated the patient and found patient to be hemodynamically stable in no distress Admission and further testing considered, she will need admission to the hospital for transfusion and possible further GI workup.  Patient in agreement with plan for transfusion and admission         Final Clinical Impression(s) / ED Diagnoses Final diagnoses:  Acute GI bleeding  Symptomatic anemia  Hyperkalemia  End stage renal disease ([place])    Rx / DC Orders ED Discharge Orders     None         Hayden Rasmussen, MD 06/07/22 1708

## 2022-06-07 NOTE — ED Notes (Signed)
[facility] in route, ETA 5 min, Reports called to Shirlean Mylar, RN at 69M.  Pt stable for transfer

## 2022-06-08 ENCOUNTER — Inpatient Hospital Stay (HOSPITAL_COMMUNITY): Payer: 59 | Admitting: Anesthesiology

## 2022-06-08 ENCOUNTER — Ambulatory Visit (INDEPENDENT_AMBULATORY_CARE_PROVIDER_SITE_OTHER): Payer: 59

## 2022-06-08 ENCOUNTER — Encounter (HOSPITAL_COMMUNITY)
Admission: EM | Disposition: A | Discharge status: Home / Self Care | Payer: Self-pay | Source: Home / Self Care | Attending: Internal Medicine

## 2022-06-08 ENCOUNTER — Encounter (HOSPITAL_COMMUNITY): Payer: Self-pay | Admitting: Internal Medicine

## 2022-06-08 ENCOUNTER — Ambulatory Visit: Payer: 59 | Admitting: Nurse Practitioner

## 2022-06-08 DIAGNOSIS — D62 Acute posthemorrhagic anemia: Secondary | ICD-10-CM | POA: Diagnosis not present

## 2022-06-08 DIAGNOSIS — Z992 Dependence on renal dialysis: Secondary | ICD-10-CM

## 2022-06-08 DIAGNOSIS — I130 Hypertensive heart and chronic kidney disease with heart failure and stage 1 through stage 4 chronic kidney disease, or unspecified chronic kidney disease: Secondary | ICD-10-CM

## 2022-06-08 DIAGNOSIS — K31819 Angiodysplasia of stomach and duodenum without bleeding: Secondary | ICD-10-CM | POA: Diagnosis not present

## 2022-06-08 DIAGNOSIS — E1122 Type 2 diabetes mellitus with diabetic chronic kidney disease: Secondary | ICD-10-CM

## 2022-06-08 DIAGNOSIS — I428 Other cardiomyopathies: Secondary | ICD-10-CM

## 2022-06-08 DIAGNOSIS — K922 Gastrointestinal hemorrhage, unspecified: Secondary | ICD-10-CM | POA: Diagnosis not present

## 2022-06-08 DIAGNOSIS — I509 Heart failure, unspecified: Secondary | ICD-10-CM

## 2022-06-08 DIAGNOSIS — J449 Chronic obstructive pulmonary disease, unspecified: Secondary | ICD-10-CM | POA: Diagnosis not present

## 2022-06-08 DIAGNOSIS — K449 Diaphragmatic hernia without obstruction or gangrene: Secondary | ICD-10-CM | POA: Diagnosis not present

## 2022-06-08 DIAGNOSIS — N186 End stage renal disease: Secondary | ICD-10-CM

## 2022-06-08 DIAGNOSIS — K5520 Angiodysplasia of colon without hemorrhage: Secondary | ICD-10-CM

## 2022-06-08 DIAGNOSIS — K921 Melena: Secondary | ICD-10-CM

## 2022-06-08 HISTORY — PX: ENTEROSCOPY: SHX5533

## 2022-06-08 HISTORY — PX: GIVENS CAPSULE STUDY: SHX5432

## 2022-06-08 HISTORY — PX: HOT HEMOSTASIS: SHX5433

## 2022-06-08 LAB — TYPE AND SCREEN
ABO/RH(D): O POS
Antibody Screen: NEGATIVE
Unit division: 0
Unit division: 0

## 2022-06-08 LAB — RENAL FUNCTION PANEL
Albumin: 3.4 g/dL — ABNORMAL LOW (ref 3.5–5.0)
Anion gap: 10 (ref 5–15)
BUN: 13 mg/dL (ref 8–23)
CO2: 31 mmol/L (ref 22–32)
Calcium: 9 mg/dL (ref 8.9–10.3)
Chloride: 91 mmol/L — ABNORMAL LOW (ref 98–111)
Creatinine, Ser: 4.06 mg/dL — ABNORMAL HIGH (ref 0.44–1.00)
GFR, Estimated: 11 mL/min — ABNORMAL LOW (ref >60)
Glucose, Bld: 83 mg/dL (ref 70–99)
Phosphorus: 3.7 mg/dL (ref 2.5–4.6)
Potassium: 3.7 mmol/L (ref 3.5–5.1)
Sodium: 132 mmol/L — ABNORMAL LOW (ref 135–145)

## 2022-06-08 LAB — CBC
HCT: 27.2 % — ABNORMAL LOW (ref 36.0–46.0)
Hemoglobin: 9 g/dL — ABNORMAL LOW (ref 12.0–15.0)
MCH: 27.1 pg (ref 26.0–34.0)
MCHC: 33.1 g/dL (ref 30.0–36.0)
MCV: 81.9 fL (ref 80.0–100.0)
Platelets: 186 10*3/uL (ref 150–400)
RBC: 3.32 MIL/uL — ABNORMAL LOW (ref 3.87–5.11)
RDW: 16.7 % — ABNORMAL HIGH (ref 11.5–15.5)
WBC: 6.4 10*3/uL (ref 4.0–10.5)
nRBC: 0 % (ref 0.0–0.2)

## 2022-06-08 LAB — HEPATITIS B SURFACE ANTIGEN: Hepatitis B Surface Ag: NONREACTIVE

## 2022-06-08 LAB — GLUCOSE, CAPILLARY
Glucose-Capillary: 97 mg/dL (ref 70–99)
Glucose-Capillary: 157 mg/dL — ABNORMAL HIGH (ref 70–99)

## 2022-06-08 LAB — BPAM RBC
Blood Product Expiration Date: 202403062359
Blood Product Expiration Date: 202403062359
ISSUE DATE / TIME: 202401312100
ISSUE DATE / TIME: 202401312100
Unit Type and Rh: 5100
Unit Type and Rh: 5100

## 2022-06-08 LAB — HEMOGLOBIN AND HEMATOCRIT, BLOOD
HCT: 28 % — ABNORMAL LOW (ref 36.0–46.0)
Hemoglobin: 9.4 g/dL — ABNORMAL LOW (ref 12.0–15.0)

## 2022-06-08 SURGERY — ENTEROSCOPY
Anesthesia: Monitor Anesthesia Care

## 2022-06-08 MED ORDER — HYDROMORPHONE HCL 1 MG/ML IJ SOLN: 0.2500 mg | INTRAMUSCULAR | Status: DC | PRN

## 2022-06-08 MED ORDER — GLUCAGON HCL RDNA (DIAGNOSTIC) 1 MG IJ SOLR
INTRAMUSCULAR | Status: DC | PRN
  Administered 2022-06-08: .5 mg via INTRAVENOUS

## 2022-06-08 MED ORDER — OXYCODONE HCL 5 MG PO TABS: 5.0000 mg | ORAL_TABLET | Freq: Once | ORAL | Status: DC | PRN

## 2022-06-08 MED ORDER — PROPOFOL 10 MG/ML IV BOLUS
INTRAVENOUS | Status: DC | PRN
  Administered 2022-06-08 (×2): 20 mg via INTRAVENOUS
  Administered 2022-06-08: 40 mg via INTRAVENOUS

## 2022-06-08 MED ORDER — CHLORHEXIDINE GLUCONATE CLOTH 2 % EX PADS: 6.0000 | MEDICATED_PAD | Freq: Every day | CUTANEOUS | Status: DC

## 2022-06-08 MED ORDER — LIDOCAINE 2% (20 MG/ML) 5 ML SYRINGE
INTRAMUSCULAR | Status: DC | PRN
  Administered 2022-06-08: 100 mg via INTRAVENOUS

## 2022-06-08 MED ORDER — PROPOFOL 500 MG/50ML IV EMUL
INTRAVENOUS | Status: DC | PRN
  Administered 2022-06-08: 100 ug/kg/min via INTRAVENOUS

## 2022-06-08 MED ORDER — DOXERCALCIFEROL 4 MCG/2ML IV SOLN
4.0000 ug | INTRAVENOUS | Status: DC
  Administered 2022-06-09: 4 ug via INTRAVENOUS
  Filled 2022-06-08: qty 2

## 2022-06-08 MED ORDER — SODIUM CHLORIDE 0.9 % IV SOLN
INTRAVENOUS | Status: AC | PRN
  Administered 2022-06-08: 500 mL via INTRAVENOUS

## 2022-06-08 MED ORDER — PHENYLEPHRINE 80 MCG/ML (10ML) SYRINGE FOR IV PUSH (FOR BLOOD PRESSURE SUPPORT)
PREFILLED_SYRINGE | INTRAVENOUS | Status: DC | PRN
  Administered 2022-06-08 (×3): 160 ug via INTRAVENOUS

## 2022-06-08 MED ORDER — CINACALCET HCL 30 MG PO TABS
30.0000 mg | ORAL_TABLET | ORAL | Status: DC
  Administered 2022-06-09: 30 mg via ORAL
  Filled 2022-06-08: qty 1

## 2022-06-08 MED ORDER — OXYCODONE HCL 5 MG/5ML PO SOLN: 5.0000 mg | Freq: Once | ORAL | Status: DC | PRN

## 2022-06-08 NOTE — Progress Notes (Signed)
  Transition of Care [facility]) Screening Note   Patient Details  Name: Morgan Clay Date of Birth: 06-24-1945   Transition of Care [facility]) CM/SW Contact:    Tom-Johnson, Renea Ee, RN Phone Number: 06/08/2022, 2:49 PM  Patient is admitted for Acute Upper GI Bleed. Hgb on admit was 5.9, 2 unit PRBC given now at 9.0. Patient had Small Bowel Endoscopy done today. CM verified  information on file with patient and no changes made. Has family support.   Transition of Care Department [facility]) has reviewed patient and no TOC needs or recommendations have been identified at this time. TOC will continue to monitor patient advancement through interdisciplinary progression rounds. If new patient transition needs arise, please place a TOC consult.

## 2022-06-08 NOTE — Op Note (Signed)
[facility] Patient Name: Morgan Clay Procedure Date : 06/08/2022 MRN: [id number] Attending MD: Jerene Bears , MD, [id number] Date of Birth: 11/05/1945 CSN: [id number] Age: 77 Admit Type: Inpatient Procedure:                Small bowel enteroscopy Indications:              Acute post hemorrhagic anemia, Melena Providers:                Lajuan Lines. Hilarie Fredrickson, MD, Jaci Carrel, RN, Brien Mates, Technician Referring MD:             [facility] Medicines:                Monitored Anesthesia Care Complications:            No immediate complications. Estimated Blood Loss:     Estimated blood loss: none. Procedure:                Pre-Anesthesia Assessment:                           - Prior to the procedure, a History and Physical                            was performed, and patient medications and                            allergies were reviewed. The patient's tolerance of                            previous anesthesia was also reviewed. The risks                            and benefits of the procedure and the sedation                            options and risks were discussed with the patient.                            All questions were answered, and informed consent                            was obtained. Prior Anticoagulants: The patient has                            taken Eliquis (apixaban), last dose was 1 day prior                            to procedure. ASA Grade Assessment: III - A patient                            with severe systemic disease. After reviewing the  risks and benefits, the patient was deemed in                            satisfactory condition to undergo the procedure.                           After obtaining informed consent, the endoscope was                            passed under direct vision. Throughout the                            procedure, the patient's  blood pressure, pulse, and                            oxygen saturations were monitored continuously. The                            [id number]-H190TL ([id number] Olympus slim colonoscope was                            introduced through the mouth and advanced to the                            proximal jejunum. The small bowel enteroscopy was                            accomplished without difficulty. The patient                            tolerated the procedure well. Scope In: Scope Out: Findings:      The examined esophagus was normal.      The entire examined stomach was normal.      Two small angioectasias with no active bleeding were found in the second       portion of the duodenum and in the third portion of the duodenum.       Fulguration to ablate the lesions to prevent bleeding by argon plasma at       0.5 liters/minute and 20 watts was successful.      Two angioectasias, one small and one medium with typical arborization,       the medium-sized with contact bleeding were found in the proximal       jejunum. Fulguration to ablate the lesions by argon plasma at 0.5       liters/minute and 20 watts was successful.      Using the adult upper endoscope, the video capsule enteroscope was       advanced into the second portion of the duodenum and deployed       successfully. Impression:               - Normal esophagus.                           - Normal stomach.                           -  Two non-bleeding angioectasias in the duodenum.                            Treated with argon plasma coagulation (APC).                           - Two angioectasias in the jejunum. Treated with                            argon plasma coagulation (APC).                           - Successful deployment of the Video Capsule                            Enteroscope placed in the duodenum.                           - No specimens collected. Moderate Sedation:      N/A Recommendation:           - Return  patient to hospital ward for ongoing care.                           - Advance diet as tolerated. Capsule protocol diet.                           - We will try to interpret capsule study tomorrow,                            but this does not need to keep patient hospitalized                            if she is felt appropriate for discharge.                           - Okay to resume Eliquis.                           - Close attention to Hgb and iron counts. Replace                            iron as needed. Procedure Code(s):        --- Professional ---                           [phone], 37, Small intestinal endoscopy, enteroscopy                            beyond second portion of duodenum, not including                            ileum; with control of bleeding (eg, injection,  bipolar cautery, unipolar cautery, laser, heater                            probe, stapler, plasma coagulator) Diagnosis Code(s):        --- Professional ---                           K44.9, Diaphragmatic hernia without obstruction or                            gangrene                           K31.819, Angiodysplasia of stomach and duodenum                            without bleeding                           K55.20, Angiodysplasia of colon without hemorrhage                           D62, Acute posthemorrhagic anemia                           K92.1, Melena (includes Hematochezia) CPT copyright 2022 [employer]. All rights reserved. The codes documented in this report are preliminary and upon coder review may  be revised to meet current compliance requirements. Jerene Bears, MD 06/08/2022 12:01:58 PM This report has been signed electronically. Number of Addenda: 0

## 2022-06-08 NOTE — Transfer of Care (Signed)
Immediate Anesthesia Transfer of Care Note  Patient: Morgan Clay  Procedure(s) Performed: ENTEROSCOPY HOT HEMOSTASIS (ARGON PLASMA COAGULATION/BICAP) GIVENS CAPSULE STUDY  Patient Location: PACU  Anesthesia Type:MAC  Level of Consciousness: drowsy and patient cooperative  Airway & Oxygen Therapy: Patient Spontanous Breathing and Patient connected to nasal cannula oxygen  Post-op Assessment: Report given to RN and Post -op Vital signs reviewed and stable  Post vital signs: Reviewed and stable  Last Vitals:  Vitals Value Taken Time  BP 95/45 06/08/22 1155  Temp    Pulse 63 06/08/22 1156  Resp 20 06/08/22 1156  SpO2 100 % 06/08/22 1156  Vitals shown include unvalidated device data.  Last Pain:  Vitals:   06/08/22 1032  TempSrc: Temporal  PainSc: 0-No pain         Complications: No notable events documented.

## 2022-06-08 NOTE — Progress Notes (Signed)
[facility] NEPHROLOGY PROGRESS NOTE  Assessment/ Plan: Pt is a 77 y.o. yo female  PMH significant for ESRD, HFrEF with AICD, CAD, A-fib/flutter, T2DM, GERD, OSA, Hep C cirrhosis, T2DM and h/o GIB with AVM's and diverticular bleeding admitted for black-colored stools/GI bleed. Dialysis Orders: Center:  [facility]  on MWF . EDW 56kg HD Bath 2K/2Ca  Time 3:30 Heparin none. Access LUE AVF BFR 400 DFR 600    Micera 225 mcg IVP (given 06/05/22) Hectoral 4 mcg IVP TIW Sensipar 30 mg with HD TIW Korsuva 0.6 mL IVP tiw  # Acute blood loss anemia/symptomatic anemia with hemoglobin of 5.9 on admission.  Consult for GI bleed.  Received a unit of blood transfusion.  GI was consulted for possible endoscopy.  # ESRD MWF, status post dialysis yesterday with 3 L ultrafiltration, tolerated well.  Plan for regular HD tomorrow.  No heparin.  # Secondary hyperparathyroidism: Resume Hectorol, Sensipar.  Monitor lab.  # HTN/volume: Blood pressure acceptable.  Not on antihypertensives.  # Hyperkalemia: Received dialysis with improvement of potassium level.   Subjective: Seen and examined at bedside.  Denies nausea, vomiting, chest pain, shortness of breath.  Currently NPO. Objective Vital signs in last 24 hours: Vitals:   06/08/22 0502 06/08/22 0558 06/08/22 0943 06/08/22 1032  BP:  (!) 100/55 (!) 107/47 (!) 134/45  Pulse:  78 71 70  Resp:  18  14  Temp:  98 F (36.7 C) 98.4 F (36.9 C) (!) 97.1 F (36.2 C)  TempSrc:  Oral  Temporal  SpO2:  100% 99% 100%  Weight: 58.1 kg     Height:       Weight change:   Intake/Output Summary (Last 24 hours) at 06/08/2022 1056 Last data filed at 06/08/2022 0800 Gross per 24 hour  Intake 635 ml  Output 3000 ml  Net -2365 ml       Labs: RENAL PANEL Recent Labs    10/25/21 0410 10/26/21 0636 10/27/21 0328 11/10/21 1151 11/17/21 1033 12/29/21 1211 03/06/22 1159 03/08/22 0915 03/09/22 0317 03/16/22 1226 03/20/22 1159 03/27/22 1650  04/07/22 0817 06/07/22 1307 06/08/22 0719  NA 137 139 135 137 135 137 134* 133* 131* 140 133* 138 140 132* 132*  K 3.9 3.8 3.4* 3.8 4.4 4.5 4.9 3.6 3.5 3.4* 4.4 3.4* 4.4 5.7* 3.7  CL 95* 100 94* 92* 91* 91* 92* 90* 90* 96 91* 94* 93* 92* 91*  CO2 33* 28 30 35* 34* 31 27  --  25 36* 33* 31 34* 26 31  GLUCOSE 75 82 84 75 76 115* 104* 81 116* 103* 92 97 101* 106* 83  BUN '11 15 9 12 13 23 '$ 45* 34* 30* 12 29* 14 29* 47* 13  CREATININE 4.28* 6.29* 4.71* 4.55* 4.55* 5.65* 7.83* 5.90* 6.50* 4.73* 7.20* 4.71* 6.38* 7.72* 4.06*  CALCIUM 7.8* 8.3* 8.2* 9.0 9.3 8.9 8.1*  --  7.9* 8.7 8.1* 8.2* 9.5 8.5* 9.0  MG  --   --   --   --   --   --   --   --   --  2.2  --   --   --   --   --   PHOS 2.7 3.4 3.1  --   --   --   --   --   --   --   --   --   --   --  3.7  ALBUMIN 3.6 3.8 3.4* 4.5 4.5 4.5 3.7  --   --  4.3 4.1 3.8 4.4  --  3.4*     Liver Function Tests: Recent Labs  Lab 06/08/22 0719  ALBUMIN 3.4*   No results for input(s): "LIPASE", "AMYLASE" in the last 168 hours. No results for input(s): "AMMONIA" in the last 168 hours. CBC: Recent Labs    03/16/22 1226 03/20/22 1159 03/27/22 1650 04/07/22 0817 06/07/22 1307 06/08/22 0719  HGB 9.8* 9.1* 9.9* 11.1* 5.9* 9.0*  MCV 96.1 95.6 94.6 93.2 79.5* 81.9  IRON 82  --   --   --   --   --     Cardiac Enzymes: No results for input(s): "CKTOTAL", "CKMB", "CKMBINDEX", "TROPONINI" in the last 168 hours. CBG: Recent Labs  Lab 06/08/22 0135  GLUCAP 97    Iron Studies: No results for input(s): "IRON", "TIBC", "TRANSFERRIN", "FERRITIN" in the last 72 hours. Studies/Results: DG Chest Port 1 View  Result Date: 06/07/2022 CLINICAL DATA:  Shortness of breath. EXAM: PORTABLE CHEST 1 VIEW COMPARISON:  May 16, 2022. FINDINGS: Stable cardiomegaly. Left-sided defibrillator is unchanged in position. Both lungs are clear. The visualized skeletal structures are unremarkable. IMPRESSION: No active disease. Electronically Signed   By: Marijo Conception M.D.    On: 06/07/2022 13:05    Medications: Infusions:  sodium chloride 500 mL (06/08/22 1035)    Scheduled Medications:  [MAR Hold] sodium chloride   Intravenous Once   [MAR Hold] amiodarone  200 mg Oral Daily   [MAR Hold] levothyroxine  125 mcg Oral QAC breakfast   [MAR Hold] pantoprazole (PROTONIX) IV  40 mg Intravenous Q12H   [MAR Hold] sodium chloride flush  3 mL Intravenous Q12H   [MAR Hold] sodium zirconium cyclosilicate  10 g Oral Once    have reviewed scheduled and prn medications.  Physical Exam: General:NAD, comfortable Heart:RRR, s1s2 nl Lungs:clear b/l, no crackle Abdomen:soft, Non-tender, non-distended Extremities:No edema Dialysis Access: Left upper extremity AV fistula has good thrill and bruit.  Jazsmine Macari Prasad Daron Breeding 06/08/2022,10:56 AM  LOS: 1 day

## 2022-06-08 NOTE — Anesthesia Postprocedure Evaluation (Signed)
Anesthesia Post Note  Patient: Morgan Clay  Procedure(s) Performed: ENTEROSCOPY HOT HEMOSTASIS (ARGON PLASMA COAGULATION/BICAP) GIVENS CAPSULE STUDY     Patient location during evaluation: PACU Anesthesia Type: MAC Level of consciousness: awake and alert Pain management: pain level controlled Vital Signs Assessment: post-procedure vital signs reviewed and stable Respiratory status: spontaneous breathing, nonlabored ventilation and respiratory function stable Cardiovascular status: blood pressure returned to baseline and stable Postop Assessment: no apparent nausea or vomiting Anesthetic complications: no   No notable events documented.  Last Vitals:  Vitals:   06/08/22 1230 06/08/22 1301  BP: (!) 123/47 (!) 114/53  Pulse: 69 73  Resp: 12   Temp: 36.6 C 36.7 C  SpO2: 100% 99%    Last Pain:  Vitals:   06/08/22 1230  TempSrc:   PainSc: 0-No pain                 Lynda Rainwater

## 2022-06-08 NOTE — Progress Notes (Signed)
Pt receives out-pt HD at [facility] on MWF. Will assist as needed.   [place] [facility] [phone] 

## 2022-06-08 NOTE — Anesthesia Preprocedure Evaluation (Signed)
Anesthesia Evaluation  Patient identified by MRN, date of birth, ID band Patient awake    Reviewed: Allergy & Precautions, NPO status , Patient's Chart, lab work & pertinent test results  Airway Mallampati: III  TM Distance: >3 FB Neck ROM: Full    Dental no notable dental hx. (+) Missing   Pulmonary asthma , sleep apnea , COPD   Pulmonary exam normal breath sounds clear to auscultation       Cardiovascular hypertension, +CHF and + DOE  Normal cardiovascular exam+ dysrhythmias + pacemaker + Cardiac Defibrillator  Rhythm:Regular Rate:Normal     Neuro/Psych  PSYCHIATRIC DISORDERS  Depression     Neuromuscular disease    GI/Hepatic negative GI ROS,,,(+) Cirrhosis       , Hepatitis -  Endo/Other  diabetesHypothyroidism    Renal/GU ESRF and DialysisRenal disease     Musculoskeletal  (+) Arthritis ,    Abdominal   Peds  Hematology  (+) Blood dyscrasia (Eliquis), anemia   Anesthesia Other Findings anemia, bloody and black stools, FOBT +, stable cirrhosis  Reproductive/Obstetrics                             Anesthesia Physical Anesthesia Plan  ASA: 4  Anesthesia Plan: MAC   Post-op Pain Management: Minimal or no pain anticipated   Induction: Intravenous  PONV Risk Score and Plan: 2 and Propofol infusion and Treatment may vary due to age or medical condition  Airway Management Planned: Simple Face Mask  Additional Equipment:   Intra-op Plan:   Post-operative Plan:   Informed Consent: I have reviewed the patients History and Physical, chart, labs and discussed the procedure including the risks, benefits and alternatives for the proposed anesthesia with the patient or authorized representative who has indicated his/her understanding and acceptance.     Dental advisory given  Plan Discussed with: CRNA  Anesthesia Plan Comments:        Anesthesia Quick Evaluation

## 2022-06-08 NOTE — Consult Note (Signed)
Consultation  Referring Provider:  [facility]/ [place] Primary Care Physician:  Mackie Pai, PA-C Primary Gastroenterologist:  Dr.Gessner  Reason for Consultation: Weakness, fatigue, melena and profound anemia   HPI: Morgan Clay is a 77 y.o. female with multiple comorbidities including end-stage renal disease, on dialysis, congestive heart failure with reduced EF, she status post pacemaker and AICD.  Most recent echo showed EF of 50 to 55% and moderate aortic insufficiency.  Also with coronary artery disease, atrial fibs flutter for which she is on Eliquis, adult onset diabetes mellitus, sleep apnea, history of pulmonary hypertension and hep C related cirrhosis. She has had recurrent issues with GI bleeding.  She presents at this time to the emergency room yesterday after 3 to 4-day history of black stools, she has not seen any overt blood.  She has been having at least 2 bowel movements every day which is more than her usual.  No abdominal pain until yesterday when she had some lower abdominal discomfort which has since resolved.  No nausea or vomiting.  She had become progressively more weak and fatigued over the past few days. Her last dose of Eliquis was yesterday morning.  She is on a PPI at home chronically. Labs on arrival to emergency room yesterday with hemoglobin of 5.9 hematocrit 18.6 down from hemoglobin of 11 in December 2023. He had missed dialysis yesterday, was dialyzed last night and received transfusions yesterday. Labs this morning show hemoglobin of 9 hematocrit of 27.0 BUN 13/creatinine 4.06/sodium 132/potassium 3.7  She had last been hospitalized with bleeding in November 2023 and had EGD at that time per Dr. Lyndel Safe no esophageal varices noted, stomach normal, she had 3 AVMs in the duodenal bulb 2 to 3 mm in size and treated with APC. She had undergone EGD and colonoscopy in June 2023 when hospitalized for bleeding, colonoscopy found to have multiple diverticuli and a  patent anastomosis in the rectosigmoid colon, internal hemorrhoids, no active bleeding. EGD at that time showed a few gastric erosions and no varices. She had had capsule endoscopy as an outpatient January 2022 with gastric retention for the entire study. Enteroscopy October 2021 with 1 column of esophageal varices nonbleeding and an AVM in the prepylorus which was treated with APC.   Past Medical History:  Diagnosis Date   Angiodysplasia of small intestine 06/10/2020   Ileum - seen on capsule endoscopy 05/2020 - ablated at colonoscopy   Aortic atherosclerosis ([place]) 08/30/2017   Arthritis    "qwhere" (01/03/2018)   Asthma    reports mild asthma since childhood - had COPD on dx list from prior PCP   Biventricular ICD (implantable cardioverter-defibrillator) in place 01/08/2007   Qualifier: Diagnosis of  By: Hassell Done FNP, [place]     Bleeding pseudoaneurysm of left brachiocephalic arteriovenous fistula ([place]) 09/24/2020   Chronic obstructive pulmonary disease ([place])    Chronic systolic congestive heart failure ([place])    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 (HCV) ([place])    HEPATITIS C - s/p treatment with Harvoni, saw hepatology, Dawn Drazek   Diverticulitis of left colon status post robotic low anterior to sigmoid resection 05/22/2018 07/31/2017   End stage renal disease ([place]) 03/22/2021   Glaucoma    Gout    History of blood transfusion ~ 11/2017   Hypertension associated with diabetes ([place]) 06/07/2017   Hypothyroidism    LBBB (left bundle branch block)    Malnutrition of moderate degree 07/09/2020   MDD (major depressive disorder), recurrent, in partial remission ([place])  06/07/2017   OBSTRUCTIVE SLEEP APNEA 11/14/2007   no CPAP   OSTEOPENIA 09/30/2008   Persistent atrial fibrillation ([place]) 02/06/2020   Polyneuropathy associated with underlying disease ([place]) 06/09/2021   Pulmonary hypertension ([place]) on echocardiogram 01/14/2018   Secondary esophageal varices without bleeding  ([place]) 12/23/2020   Formatting of this note might be different from the original. Patient had trace esophageal varices during small bowel enteroscopy in October 2021, follow-up EGD in January 2022 with no varices.  She previously had GI bleeding thought to be from AVM versus GAVE.  She is no longer on carvedilol and will need surveillance endoscopy.  Patient was previously followed at [facility] and I have asked her    Secondary hypercoagulable state ([place]) 02/06/2020   Type 2 diabetes, controlled, with renal manifestation ([place]) 11/24/2013    Past Surgical History:  Procedure Laterality Date   A/V FISTULAGRAM N/A 03/28/2021   Procedure: A/V FISTULAGRAM;  Surgeon: Waynetta Sandy, MD;  Location: [place] [facility];  Service: Cardiovascular;  Laterality: N/A;   APPENDECTOMY     AV FISTULA PLACEMENT Left 08/05/2020   Procedure: LEFT UPPER EXTREMITY ARTERIOVENOUS (AV) FISTULA CREATION;  Surgeon: Cherre Robins, MD;  Location: [place];  Service: Vascular;  Laterality: Left;   [place] Left 09/16/2020   Procedure: LEFT SECOND STAGE [place];  Surgeon: Cherre Robins, MD;  Location: [place];  Service: Vascular;  Laterality: Left;  PERIPHERAL NERVE BLOCK   CARDIAC CATHETERIZATION     CARDIOVERSION N/A 12/14/2014   Procedure: CARDIOVERSION;  Surgeon: Dorothy Spark, MD;  Location: [place];  Service: Cardiovascular;  Laterality: N/A;   CARDIOVERSION N/A 02/26/2017   Procedure: CARDIOVERSION;  Surgeon: Evans Lance, MD;  Location: [place] [facility];  Service: Cardiovascular;  Laterality: N/A;   CARDIOVERSION N/A 07/24/2017   Procedure: CARDIOVERSION;  Surgeon: Acie Fredrickson Wonda Cheng, MD;  Location: [facility] ENDOSCOPY;  Service: Cardiovascular;  Laterality: N/A;   CARDIOVERSION N/A 02/12/2020   Procedure: CARDIOVERSION;  Surgeon: [name], MD;  Location: [facility] ENDOSCOPY;  Service: Cardiovascular;  Laterality: N/A;   CARDIOVERSION N/A 09/15/2021    Procedure: CARDIOVERSION;  Surgeon: [name], MD;  Location: [facility] ENDOSCOPY;  Service: Cardiovascular;  Laterality: N/A;   CARPAL TUNNEL RELEASE  07/21/2021   COLONOSCOPY N/A 11/08/2017   Procedure: COLONOSCOPY;  Surgeon: Milus Banister, MD;  Location: [facility] ENDOSCOPY;  Service: Endoscopy;  Laterality: N/A;   COLONOSCOPY WITH PROPOFOL N/A 05/31/2020   Procedure: COLONOSCOPY WITH PROPOFOL;  Surgeon: Irene Shipper, MD;  Location: [facility] ENDOSCOPY;  Service: Endoscopy;  Laterality: N/A;   COLONOSCOPY WITH PROPOFOL N/A 10/26/2021   Procedure: COLONOSCOPY WITH PROPOFOL;  Surgeon: Sharyn Creamer, MD;  Location: [place];  Service: Gastroenterology;  Laterality: N/A;   DILATION AND CURETTAGE OF UTERUS     ENTEROSCOPY N/A 03/02/2020   Procedure: ENTEROSCOPY;  Surgeon: Yetta Flock, MD;  Location: [facility] ENDOSCOPY;  Service: Gastroenterology;  Laterality: N/A;   EP IMPLANTABLE DEVICE N/A 10/06/2015   Procedure: BIV ICD Generator Changeout;  Surgeon: [name], MD;  Location: [place] [facility];  Service: Cardiovascular;  Laterality: N/A;   ESOPHAGOGASTRODUODENOSCOPY N/A 10/26/2017   Procedure: ESOPHAGOGASTRODUODENOSCOPY (EGD);  Surgeon: Ladene Artist, MD;  Location: [facility];  Service: Endoscopy;  Laterality: N/A;   ESOPHAGOGASTRODUODENOSCOPY (EGD) WITH PROPOFOL N/A 11/07/2017   Procedure: ESOPHAGOGASTRODUODENOSCOPY (EGD) WITH PROPOFOL;  Surgeon: Milus Banister, MD;  [facility];  Service: Endoscopy;  Laterality: N/A;  ESOPHAGOGASTRODUODENOSCOPY (EGD) WITH PROPOFOL N/A 05/29/2020   Procedure: ESOPHAGOGASTRODUODENOSCOPY (EGD) WITH PROPOFOL;  Surgeon: Doran Stabler, MD;  Location: [place];  Service: Gastroenterology;  Laterality: N/A;   ESOPHAGOGASTRODUODENOSCOPY (EGD) WITH PROPOFOL N/A 10/26/2021   Procedure: ESOPHAGOGASTRODUODENOSCOPY (EGD) WITH PROPOFOL;  Surgeon: Sharyn Creamer, MD;  Location: [place];  Service: Gastroenterology;  Laterality: N/A;    ESOPHAGOGASTRODUODENOSCOPY (EGD) WITH PROPOFOL N/A 03/08/2022   Procedure: ESOPHAGOGASTRODUODENOSCOPY (EGD) WITH PROPOFOL;  Surgeon: Jackquline Denmark, MD;  Location: [place];  Service: Gastroenterology;  Laterality: N/A;   GIVENS CAPSULE STUDY N/A 05/19/2020   Procedure: GIVENS CAPSULE STUDY;  Surgeon: [name], MD;  Location: [place];  Service: Endoscopy;  Laterality: N/A;  .adm for obs since pacemaker, PA wil enter order and see pt   GIVENS CAPSULE STUDY N/A 05/29/2020   Procedure: GIVENS CAPSULE STUDY;  Surgeon: Doran Stabler, MD;  Location: [place];  Service: Gastroenterology;  Laterality: N/A;   HEMATOMA EVACUATION Left 09/24/2020   Procedure: EVACUATION HEMATOMA ARM;  Surgeon: Rosetta Posner, MD;  Location: [facility];  Service: Vascular;  Laterality: Left;   HOT HEMOSTASIS N/A 10/26/2017   Procedure: HOT HEMOSTASIS (ARGON PLASMA COAGULATION/BICAP);  Surgeon: Ladene Artist, MD;  Location: [facility];  Service: Endoscopy;  Laterality: N/A;   HOT HEMOSTASIS N/A 03/02/2020   Procedure: HOT HEMOSTASIS (ARGON PLASMA COAGULATION/BICAP);  Surgeon: Yetta Flock, MD;  Location: [facility] ENDOSCOPY;  Service: Gastroenterology;  Laterality: N/A;   HOT HEMOSTASIS N/A 05/31/2020   Procedure: HOT HEMOSTASIS (ARGON PLASMA COAGULATION/BICAP);  Surgeon: Irene Shipper, MD;  Location: [facility] ENDOSCOPY;  Service: Endoscopy;  Laterality: N/A;   HOT HEMOSTASIS N/A 03/08/2022   Procedure: HOT HEMOSTASIS (ARGON PLASMA COAGULATION/BICAP);  Surgeon: Jackquline Denmark, MD;  Location: [facility] ENDOSCOPY;  Service: Gastroenterology;  Laterality: N/A;   INSERT / REPLACE / REMOVE PACEMAKER  ?2008   IR PERC TUN PERIT CATH WO PORT S&I /IMAG  07/05/2020   PERIPHERAL VASCULAR INTERVENTION Left 03/28/2021   Procedure: PERIPHERAL VASCULAR INTERVENTION;  Surgeon: Waynetta Sandy, MD;  Location: [place] [facility];  Service: Cardiovascular;  Laterality: Left;   POLYPECTOMY  11/08/2017   Procedure: POLYPECTOMY;   Surgeon: Milus Banister, MD;  [facility]OS[facility];  Service: Endoscopy;;   PROCTOSCOPY N/A 05/22/2018   Procedure: RIGID PROCTOSCOPY;  Surgeon: Michael Boston, MD;  [facility];  Service: General;  Laterality: N/A;   RIGHT HEART CATH N/A 02/13/2018   Procedure: RIGHT HEART CATH;  Surgeon: [name], MD;  Location: [place] [facility];  Service: Cardiovascular;  Laterality: N/A;   RIGHT HEART CATH N/A 06/23/2020   Procedure: RIGHT HEART CATH;  Surgeon: [name], MD;  Location: [place] [facility];  Service: Cardiovascular;  Laterality: N/A;   RIGHT/LEFT HEART CATH AND CORONARY ANGIOGRAPHY N/A 12/11/2019   Procedure: RIGHT/LEFT HEART CATH AND CORONARY ANGIOGRAPHY;  Surgeon: [name], MD;  Location: [place] [facility];  Service: Cardiovascular;  Laterality: N/A;   SUBMUCOSAL TATTOO INJECTION  03/02/2020   Procedure: SUBMUCOSAL TATTOO INJECTION;  Surgeon: Yetta Flock, MD;  Location: [facility] END[facility]COPY;  Service: Gastroenterology;;   SUBMUCOSAL TATTOO INJECTION  05/31/2020   Procedure: SUBMUCOSAL TATTOO INJECTION;  Surgeon: Irene Shipper, MD;  Location: [place];  Service: Endoscopy;;   TEE WITHOUT CARDIOVERSION N/A 08/02/2021   Procedure: TRANSESOPHAGEAL ECHOCARDIOGRAM (TEE);  Surgeon: [name], MD;  Location: [facility] ENDOSCOPY;  Service: Cardiovascular;  Laterality: N/A;   TUBAL LIGATION  XI ROBOTIC ASSISTED LOWER ANTERIOR RESECTION N/A 05/22/2018   Procedure: XI ROBOTIC ASSISTED SIGMOID COLOECTOMY MOBILIZATION OF SPLENIC FLEXURE, FIREFLY ASSESSMENT OF PERFUSION;  Surgeon: Michael Boston, MD;  Location[facility];  Service: General;  Laterality: N/A;  ERAS PATHWAY    Prior to Admission medications   Medication Sig Start Date End Date Taking? Authorizing Provider  albuterol (VENTOLIN HFA) 108 (90 Base) MCG/ACT inhaler Inhale 2 puffs into the lungs every 6 (six) hours as needed for wheezing or shortness of breath. 04/07/22  Yes Hunsucker, Bonna Gains, MD   amiodarone (PACERONE) 200 MG tablet Take 1 tablet (200 mg total) by mouth daily. 11/10/21  Yes Saguier, Percell Miller, PA-C  apixaban (ELIQUIS) 2.5 MG TABS tablet Take 1 tablet (2.5 mg total) by mouth 2 (two) times daily. 03/11/22  Yes Pokhrel, [place], MD  cyclobenzaprine (FLEXERIL) 5 MG tablet Take 5 mg by mouth at bedtime as needed for muscle spasms. 12/07/21  Yes [provider]  gabapentin (NEURONTIN) 300 MG capsule Take 1 capsule (300 mg total) by mouth daily. Fill one rx. Computer may have sent over 2 Patient taking differently: Take 300 mg by mouth daily. 01/10/22  Yes Saguier, Percell Miller, PA-C  ipratropium (ATROVENT HFA) 17 MCG/ACT inhaler Inhale 2 puffs into the lungs every 6 (six) hours as needed for wheezing. 03/24/22  Yes Saguier, Percell Miller, PA-C  levothyroxine (SYNTHROID) 125 MCG tablet Take 1 tablet (125 mcg total) by mouth daily before breakfast. 01/16/22  Yes Saguier, Percell Miller, PA-C  nitroGLYCERIN (NITROSTAT) 0.4 MG SL tablet Place 1 tablet (0.4 mg total) under the tongue every 5 (five) minutes as needed for chest pain. 10/27/21  Yes Thurnell Lose, MD  rOPINIRole (REQUIP) 0.25 MG tablet Take 0.25 mg by mouth at bedtime. 12/08/21  Yes [provider]  traZODone (DESYREL) 50 MG tablet Take 0.5-1 tablets (25-50 mg total) by mouth at bedtime as needed for sleep. 12/16/20  Yes Saguier, Percell Miller, PA-C  benzonatate (TESSALON) 100 MG capsule Take 1 capsule (100 mg total) by mouth 3 (three) times daily as needed for cough. Patient not taking: Reported on 06/07/2022 03/20/22   Saguier, Percell Miller, PA-C  diazepam (VALIUM) 5 MG tablet 1 tab po bid prn anxiety Patient not taking: Reported on 06/07/2022 01/19/22   Saguier, Percell Miller, PA-C  fluticasone [facility]) 50 MCG/ACT nasal spray Place 2 sprays into both nostrils daily. Patient not taking: Reported on 06/07/2022 04/06/22 04/06/23  Saguier, Percell Miller, PA-C  fluticasone-salmeterol (ADVAIR) 250-50 MCG/ACT AEPB Inhale 1 puff into the lungs in the morning and at  bedtime. Patient not taking: Reported on 06/07/2022 03/29/22   Jonetta Osgood, MD  HYDROcodone bit-homatropine (HYCODAN) 5-1.5 MG/5ML syrup Take 5 mLs by mouth every 8 (eight) hours as needed for cough. Patient not taking: Reported on 06/07/2022 03/24/22   Saguier, Percell Miller, PA-C  hydrOXYzine (ATARAX) 10 MG tablet 1 tab po q hs prn itching Patient not taking: Reported on 06/07/2022 02/07/22   Saguier, Percell Miller, PA-C  lidocaine-prilocaine (EMLA) cream Apply 1 Application topically 3 (three) times a week. Patient not taking: Reported on 06/07/2022 09/08/21   [provider]  pantoprazole (PROTONIX) 40 MG tablet Take 1 tablet (40 mg total) by mouth 2 (two) times daily before a meal. Patient not taking: Reported on 06/07/2022 03/09/22   Pokhrel, Corrie Mckusick, MD  XIIDRA 5 % SOLN Place 1 drop into both eyes at bedtime. Patient not taking: Reported on 06/07/2022 11/23/21   [provider]    Current Facility-Administered Medications  Medication Dose Route Frequency Provider Last  Rate Last Admin   0.9 %  sodium chloride infusion (Manually program via Guardrails IV Fluids)   Intravenous Once Marcelyn Bruins, MD       acetaminophen (TYLENOL) tablet 650 mg  650 mg Oral Q6H PRN Marcelyn Bruins, MD       Or   acetaminophen (TYLENOL) suppository 650 mg  650 mg Rectal Q6H PRN Marcelyn Bruins, MD       albuterol (PROVENTIL) (2.5 MG/3ML) 0.083% nebulizer solution 2.5 mg  2.5 mg Nebulization Q6H PRN Marcelyn Bruins, MD       amiodarone (PACERONE) tablet 200 mg  200 mg Oral Daily Marcelyn Bruins, MD       diazepam (VALIUM) tablet 5 mg  5 mg Oral BID PRN Marcelyn Bruins, MD   5 mg at 06/08/22 0124   gabapentin (NEURONTIN) capsule 300 mg  300 mg Oral Daily PRN Marcelyn Bruins, MD   300 mg at 06/08/22 0125   levothyroxine (SYNTHROID) tablet 125 mcg  125 mcg Oral QAC breakfast Marcelyn Bruins, MD       pantoprazole (PROTONIX) injection 40 mg  40 mg Intravenous Q12H Marcelyn Bruins, MD   40 mg at 06/08/22 0125   sodium chloride flush (NS) 0.9 % injection 3 mL  3 mL Intravenous Q12H Marcelyn Bruins, MD       sodium zirconium cyclosilicate (LOKELMA) packet 10 g  10 g Oral Once Marcelyn Bruins, MD        Allergies as of 06/07/2022   (No Known Allergies)    Family History  Problem Relation Age of Onset   Asthma Father    Heart attack Father    Asthma Sister    Lung cancer Sister    Heart attack Mother    Stroke Brother    Colon cancer Neg Hx    Esophageal cancer Neg Hx    Pancreatic cancer Neg Hx    Stomach cancer Neg Hx    Liver disease Neg Hx     Social History   Socioeconomic History   Marital status: Divorced    Spouse name: Not on file   Number of children: 1   Years of education: 12   Highest education level: Not on file  Occupational History   Not on file  Tobacco Use   Smoking status: Never   Smokeless tobacco: Never  Vaping Use   Vaping Use: Never used  Substance and Sexual Activity   Alcohol use: Not Currently    Comment: 01/03/2018 "couple drinks/month"   Drug use: Not Currently    Types: Marijuana    Comment: VERY INTERMITTENT    Sexual activity: Not Currently  Other Topics Concern   Not on file  Social History Narrative   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      Home Situation: lives alone      Spiritual Beliefs: Christian - [employer]      Lifestyle: no regular exercise; poor diet      She is divorced at least one adult child   Never smoker    rare alcohol to occasional at best    no substance abuse         Social Determinants of Health   Financial Resource Strain: Low Risk  (07/15/2021)   Overall Financial Resource Strain (CARDIA)    Difficulty of Paying Living Expenses: Not hard at all  Food Insecurity: No Food Insecurity (06/07/2022)   Hunger Vital Sign  Worried About Charity fundraiser in the Last Year: Never true    [place] in the Last Year: Never true  Transportation Needs:  No Transportation Needs (06/07/2022)   PRAPARE - Hydrologist (Medical): No    Lack of Transportation (Non-Medical): No  Physical Activity: Not on file  Stress: No Stress Concern Present (07/15/2021)   [place]    Feeling of Stress : Not at all  Social Connections: Moderately Isolated (07/15/2021)   Social Connection and Isolation Panel [NHANES]    Frequency of Communication with Friends and Family: More than three times a week    Frequency of Social Gatherings with Friends and Family: Three times a week    Attends Religious Services: More than 4 times per year    Active Member of Clubs or Organizations: No    Attends Archivist Meetings: Never    Marital Status: Divorced  Human resources officer Violence: Not At Risk (06/07/2022)   Humiliation, Afraid, Rape, and Kick questionnaire    Fear of Current or Ex-Partner: No    Emotionally Abused: No    Physically Abused: No    Sexually Abused: No    Review of Systems: Pertinent positive and negative review of systems were noted in the above HPI section.  All other review of systems was otherwise negative.   Physical Exam: Vital signs in last 24 hours: Temp:  [97.6 F (36.4 C)-98.4 F (36.9 C)] 98.4 F (36.9 C) (02/01 0943) Pulse Rate:  [71-101] 71 (02/01 0943) Resp:  [12-25] 18 (02/01 0558) BP: (100-152)/(33-65) 107/47 (02/01 0943) SpO2:  [90 %-100 %] 99 % (02/01 0943) Weight:  [57.9 kg-60.3 kg] 58.1 kg (02/01 0502)   General:   Alert,  Well-developed, well-nourished,eld AA female  pleasant and cooperative in NAD Head:  Normocephalic and atraumatic. Eyes:  Sclera clear, no icterus.   Conjunctiva pink. Ears:  Normal auditory acuity. Nose:  No deformity, discharge,  or lesions. Mouth:  No deformity or lesions.   Neck:  Supple; no masses or thyromegaly. Lungs:  Clear throughout to auscultation.   No wheezes, crackles, or rhonchi. Heart:   Regular rate and rhythm; soft murmur, pacer AICD left chest Abdomen:  Soft,nontender, BS active,nonpalp mass or hsm.   Rectal:  not done Msk:  Symmetrical without gross deformities. . Pulses:  Normal pulses noted. Extremities:  Without clubbing or edema. Neurologic:  Alert and  oriented x4;  grossly normal neurologically. Skin:  Intact without significant lesions or rashes.. Psych:  Alert and cooperative. Normal mood and affect.  Intake/Output from previous day: 01/31 0701 - 02/01 0700 In: 635 [Blood:635] Out: 3000  Intake/Output this shift: No intake/output data recorded.  Lab Results: Recent Labs    06/07/22 1307 06/08/22 0719  WBC 8.7 6.4  HGB 5.9* 9.0*  HCT 18.6* 27.2*  PLT 221 186   BMET Recent Labs    06/07/22 1307 06/08/22 0719  NA 132* 132*  K 5.7* 3.7  CL 92* 91*  CO2 26 31  GLUCOSE 106* 83  BUN 47* 13  CREATININE 7.72* 4.06*  CALCIUM 8.5* 9.0   LFT Recent Labs    06/08/22 0719  ALBUMIN 3.4*   PT/INR No results for input(s): "LABPROT", "INR" in the last 72 hours. Hepatitis Panel Recent Labs    06/07/22 2350  HEPBSAG NON REACTIVE      IMPRESSION:  #5 77 year old African-American female with history of recurrent GI  bleeding in setting of chronic Eliquis use and history of duodenal AVMs. Patient presents now with 3 to 4-day history of melena at home followed by progressive weakness and fatigue. Patient in ER yesterday found to hemoglobin of 5.9 down from 12 in December 2023.  Suspect she has had recurrent bleeding secondary to AVMs.  He does have history of cirrhosis which has been compensated and has not had any evidence of esophageal varices on the last endoscopies over the past year.  Last dose of Eliquis yesterday  #2 diverticulosis, and prior rectosigmoid resection #3.  Hep C related cirrhosis, compensated-ultrasound October 2023.  Patient followed by [facility] hepatology #4 congestive heart failure, status post AICD and pacemaker #5  end-stage renal disease on dialysis-dialyzed last night #6.  Coronary artery disease #7.  Atrial fibs flutter/on chronic Eliquis #8.  Adult onset diabetes mellitus #9.  Sleep apnea #10.  Pulmonary hypertension  Plan; We will proceed with EGD enteroscopy today with APC of any visualized AVMs.  Depending on findings at enteroscopy may need capsule endoscopy (deployed into the duodenal bulb) which can be done today as well.  Procedures were discussed in detail with the patient including indications risks and benefits and she is agreeable to proceed.  Continue IV PPI Serial hemoglobins every 12 hours and transfuse for hemoglobin less than 7 Hold Eliquis for now GI will follow with you    Jessenia Filippone PA-C 06/08/2022, 10:03 AM

## 2022-06-08 NOTE — Progress Notes (Signed)
   06/08/22 0022  Vitals  Temp 98.2 F (36.8 C)  Pulse Rate 76  Resp 14  BP (!) 131/49  SpO2 97 %  O2 Device Room Air  Weight 57.9 kg  Type of Weight Post-Dialysis  Post Treatment  Dialyzer Clearance Lightly streaked  Duration of HD Treatment -hour(s) 3.5 hour(s)  Liters Processed 84  Fluid Removed (mL) 3000 mL  Tolerated HD Treatment Yes  Post-Hemodialysis Comments Tx completed and tolerated well, 2 units transfused during tx. without complication

## 2022-06-08 NOTE — Progress Notes (Signed)
PROGRESS NOTE    Morgan Clay  VEL:[id number] DOB: 07/30/1945 DOA: 06/07/2022 PCP: Mackie Pai, PA-C    Brief Narrative:   Morgan Clay is a 77 y.o. female with past medical history significant for M2, HTN, atrial fibrillation on Eliquis, depression, hepatitis C cirrhosis, ESRD on HD MWF, peripheral neuropathy, glaucoma, history of diverticulitis s/p sigmoid resection, COPD/asthma, chronic diastolic congestive heart failure, hypothyroidism, anemia, history of biventricular ICD placement who presented to [facility] ED on 1/31 with rectal bleeding.  Patient reports 3 days of black diarrhea associated with lower abdominal pain and fatigue.  History of prior GI bleeding due to AVMs and diverticuli.  Reports last Eliquis dose 1/30 1 in the AM.  Reports last dialysis was on Monday.  Denies fever, no chills, no chest pain, no shortness of breath, no constipation, no nausea/vomiting.  In the ED, temperature 97.6 F, HR 84, RR 18, BP 152/56, SpO2 100% on room air.  WBC 8.7, hemoglobin 5.9, platelets 221.  Sodium 132, potassium 5.7, chloride 92, CO2 26, glucose 106, BUN 47, creatinine 7.72.  High sensitive troponin 43 followed by 45.  FOBT positive.  Chest x-ray with no active cardiopulmonary disease process.  GI and nephrology were consulted.  TRH consulted for admission for further evaluation management of symptomatic anemia secondary to GI bleed in the setting of anticoagulant use.    Assessment & Plan:   GI bleed Hx anemia of chronic medical/renal disease Patient presenting with 3-day history of dark stools associate with lower abdominal pain, fatigue.  Complicated by use of Eliquis, last dose a.m. of 1/31.  Hemoglobin 5.9 on admission which is down from 10 2 months prior.  History of previous GI bleed secondary to AVMs and diverticuli. -- [facility] following, appreciate assistance -- s/p 2 unit PRBCs -- Hgb 5.9>9.0 -- GI plans for small bowel enteroscopy, if unremarkable plan  video capsule endoscopy -- Continue monitor H&H every 12 hours -- Transfuse for hemoglobin less than 7.0 -- Protonix 40 mg IV twice daily -- Continue to hold Eliquis  Hyperkalemia: Resolved Potassium 5.7 on admission.  Missed last HD session.  Received Lokelma. -- K 5.7>3.7 -- Continue management with HD -- Repeat renal panel in a.m.  Hyponatremia Etiology likely volume overload in the setting of missed hemodialysis.  Sodium 132 on admission.  Continue management with HD. --renal panel in the a.m.  Type 2 diabetes mellitus Last hemoglobin A1c 4.14 October 2021, well-controlled.  Diet controlled at baseline.  Essential hypertension Currently not on antihypertensives outpatient.  Chronic diastolic congestive heart failure, compensated TTE 12/22/2021 with LVEF 50-55%, mild concentric LVH, no LV regional wall motion normalities, diastolic parameters indeterminate, RV moderately enlarged, LA severely dilated, moderate-severe TR, mild MR, IVC dilated. -- Volume management with HD  Atrial fibrillation on anticoagulation -- Continue amiodarone 200 mg p.o. daily -- Holding Eliquis as above  ESRD on HD MWF -- Nephrology following for continue HD while inpatient  Hepatitis C cirrhosis Continue outpatient follow-up with [facility] clinic  COPD/asthma Not in acute exacerbation, oxygenating well on room air.  --Albuterol neb every 2 hours.  Shortness of breath/wheezing  Hypothyroidism -- Levothyroxine 125 mcg p.o. daily    DVT prophylaxis: SCDs Start: 06/07/22 1752    Code Status: Full Code Family Communication: No family present at bedside this morning  Disposition Plan:  Level of care: Telemetry Medical Status is: Inpatient Remains inpatient appropriate because: Small bowel enteroscopy today, may need capsule endoscopy, needs further monitoring of hemoglobin following transfusion.  Will need  GI sign off before stable for discharge home    Consultants:  [facility] Nephrology  Procedures:  Small bowel enteroscopy: Pending  Antimicrobials:  None   Subjective: Patient seen examined at bedside, resting comfortably.  Lying in bed.  Wants to know when her procedure will be as she is hungry and would like some "coffee".  No other specific complaints or concerns at this time.  Reports last Eliquis dose morning of 1/31.  Denies any further bowel movement since hospitalization.  Received 2 unit PRBCs yesterday as well as hemodialysis.  Denies headache, no current dizziness, no current fatigue, no fever/chills/night sweats, no nausea/vomiting/diarrhea, no chest pain, no palpitations, no shortness of breath, no abdominal pain, no focal weakness, no fatigue, no paresthesias.  No acute events overnight per nursing staff.  Objective: Vitals:   06/08/22 0502 06/08/22 0558 06/08/22 0943 06/08/22 1032  BP:  (!) 100/55 (!) 107/47 (!) 134/45  Pulse:  78 71 70  Resp:  18  14  Temp:  98 F (36.7 C) 98.4 F (36.9 C) (!) 97.1 F (36.2 C)  TempSrc:  Oral  Temporal  SpO2:  100% 99% 100%  Weight: 58.1 kg     Height:        Intake/Output Summary (Last 24 hours) at 06/08/2022 1118 Last data filed at 06/08/2022 0800 Gross per 24 hour  Intake 635 ml  Output 3000 ml  Net -2365 ml   Filed Weights   06/08/22 0022 06/08/22 0034 06/08/22 0502  Weight: 57.9 kg 58.1 kg 58.1 kg    Examination:  Physical Exam: GEN: NAD, alert and oriented x 3, wd/wn HEENT: NCAT, PERRL, EOMI, sclera clear, MMM PULM: CTAB w/o wheezes/crackles, normal respiratory effort, on room air CV: RRR w/o M/G/R GI: abd soft, NTND, NABS, no R/G/M MSK: no peripheral edema, muscle strength globally intact 5/5 bilateral upper/lower extremities, LUE AVF noted NEURO: CN II-XII intact, no focal deficits, sensation to light touch intact PSYCH: normal mood/affect Integumentary: dry/intact, no rashes or wounds    Data Reviewed: I have personally reviewed following labs and imaging  studies  CBC: Recent Labs  Lab 06/07/22 1307 06/08/22 0719  WBC 8.7 6.4  NEUTROABS 6.1  --   HGB 5.9* 9.0*  HCT 18.6* 27.2*  MCV 79.5* 81.9  PLT 221 803   Basic Metabolic Panel: Recent Labs  Lab 06/07/22 1307 06/08/22 0719  NA 132* 132*  K 5.7* 3.7  CL 92* 91*  CO2 26 31  GLUCOSE 106* 83  BUN 47* 13  CREATININE 7.72* 4.06*  CALCIUM 8.5* 9.0  PHOS  --  3.7   GFR: Estimated Creatinine Clearance: 9.4 mL/min (A) (by C-G formula based on SCr of 4.06 mg/dL (H)). Liver Function Tests: Recent Labs  Lab 06/08/22 0719  ALBUMIN 3.4*   No results for input(s): "LIPASE", "AMYLASE" in the last 168 hours. No results for input(s): "AMMONIA" in the last 168 hours. Coagulation Profile: No results for input(s): "INR", "PROTIME" in the last 168 hours. Cardiac Enzymes: No results for input(s): "CKTOTAL", "CKMB", "CKMBINDEX", "TROPONINI" in the last 168 hours. BNP (last 3 results) Recent Labs    11/10/21 1151 11/17/21 1033 03/20/22 1159  PROBNP 1,656.0* 2,020.0* 948.0*   HbA1C: No results for input(s): "HGBA1C" in the last 72 hours. CBG: Recent Labs  Lab 06/08/22 0135  GLUCAP 97   Lipid Profile: No results for input(s): "CHOL", "HDL", "LDLCALC", "TRIG", "CHOLHDL", "LDLDIRECT" in the last 72 hours. Thyroid Function Tests: No results for input(s): "TSH", "T4TOTAL", "FREET4", "  T3FREE", "THYROIDAB" in the last 72 hours. Anemia Panel: No results for input(s): "VITAMINB12", "FOLATE", "FERRITIN", "TIBC", "IRON", "RETICCTPCT" in the last 72 hours. Sepsis Labs: No results for input(s): "PROCALCITON", "LATICACIDVEN" in the last 168 hours.  No results found for this or any previous visit (from the past 240 hour(s)).       Radiology Studies: DG Chest Port 1 View  Result Date: 06/07/2022 CLINICAL DATA:  Shortness of breath. EXAM: PORTABLE CHEST 1 VIEW COMPARISON:  May 16, 2022. FINDINGS: Stable cardiomegaly. Left-sided defibrillator is unchanged in position. Both lungs  are clear. The visualized skeletal structures are unremarkable. IMPRESSION: No active disease. Electronically Signed   By: Marijo Conception M.D.   On: 06/07/2022 13:05        Scheduled Meds:  [ZOX Hold] sodium chloride   Intravenous Once   [MAR Hold] amiodarone  200 mg Oral Daily   [START ON 06/09/2022] cinacalcet  30 mg Oral Q M,W,F   [START ON 06/09/2022] doxercalciferol  4 mcg Intravenous Q M,W,F-HD   [MAR Hold] levothyroxine  125 mcg Oral QAC breakfast   [MAR Hold] pantoprazole (PROTONIX) IV  40 mg Intravenous Q12H   [MAR Hold] sodium chloride flush  3 mL Intravenous Q12H   Continuous Infusions:  sodium chloride 500 mL (06/08/22 1035)     LOS: 1 day    Time spent: 51 minutes spent on chart review, discussion with nursing staff, consultants, updating family and interview/physical exam; more than 50% of that time was spent in counseling and/or coordination of care.    Jaziah Goeller J [place] DO [facility] Available via Epic secure chat 7am-7pm After these hours, please refer to coverage provider listed on [email or web address] 06/08/2022, 11:18 AM

## 2022-06-08 NOTE — Progress Notes (Signed)
  [facility] Progress Note   Subjective:  Seen in room - feels better today, no abd pain, CP, dyspnea. Dialyzed and transfused overnight. For scope today with GI.   Objective Vitals:   06/08/22 0502 06/08/22 0558 06/08/22 0943 06/08/22 1032  BP:  (!) 100/55 (!) 107/47 (!) 134/45  Pulse:  78 71 70  Resp:  18  14  Temp:  98 F (36.7 C) 98.4 F (36.9 C) (!) 97.1 F (36.2 C)  TempSrc:  Oral  Temporal  SpO2:  100% 99% 100%  Weight: 58.1 kg     Height:       Physical Exam General: Well appearing woman, NAD. Room air. Heart: RRR; no murmur Lungs: CTAB Abdomen: soft, non-tender Extremities: no LE edema Dialysis Access: LUE AVF + bruit  Additional Objective Labs: Basic Metabolic Panel: Recent Labs  Lab 06/07/22 1307 06/08/22 0719  NA 132* 132*  K 5.7* 3.7  CL 92* 91*  CO2 26 31  GLUCOSE 106* 83  BUN 47* 13  CREATININE 7.72* 4.06*  CALCIUM 8.5* 9.0  PHOS  --  3.7   Liver Function Tests: Recent Labs  Lab 06/08/22 0719  ALBUMIN 3.4*   CBC: Recent Labs  Lab 06/07/22 1307 06/08/22 0719  WBC 8.7 6.4  NEUTROABS 6.1  --   HGB 5.9* 9.0*  HCT 18.6* 27.2*  MCV 79.5* 81.9  PLT 221 186   Studies/Results: DG Chest Port 1 View  Result Date: 06/07/2022 CLINICAL DATA:  Shortness of breath. EXAM: PORTABLE CHEST 1 VIEW COMPARISON:  May 16, 2022. FINDINGS: Stable cardiomegaly. Left-sided defibrillator is unchanged in position. Both lungs are clear. The visualized skeletal structures are unremarkable. IMPRESSION: No active disease. Electronically Signed   By: Marijo Conception M.D.   On: 06/07/2022 13:05    Medications:  sodium chloride 20 mL/hr at 06/08/22 1057    [MAR Hold] sodium chloride   Intravenous Once   [MAR Hold] amiodarone  200 mg Oral Daily   [MAR Hold] levothyroxine  125 mcg Oral QAC breakfast   [MAR Hold] pantoprazole (PROTONIX) IV  40 mg Intravenous Q12H   [MAR Hold] sodium chloride flush  3 mL Intravenous Q12H   [MAR Hold] sodium  zirconium cyclosilicate  10 g Oral Once    Dialysis Orders: [facility]  on MWF . EDW 56kg HD Bath 2K/2Ca  Time 3:30 Heparin none. Access LUE AVF BFR 400 DFR 600    Micera 225 mcg IVP (given 06/05/22) Hectoral 4 mcg IVP q HD Sensipar 30 mg PO q HD Korsuva 0.6 mL IVP q HD  Assessment/Plan: ABLA/symptomatic anemia: Hgb 5.9 on admit. Hx of AVM's and diverticular bleeds. S/p 2U PRBCs overnight. GI consulted, going for small bowel enteroscopy today. ESRD: S/p HD last night - stick with usual MWF schedule, next tomorrow. No heparin. Hypertension/volume: BP stable, UF as tolerated with HD. Anemia  - as above, ESA just given as outpatient. Metabolic bone disease -  continue with home meds and renal diet  Nutrition - renal diet, carb modified. Hyperkalemia: On admit, improved s/p Lokelma x 1 and HD.  Afib - Eliquis on hold. Hep C cirrhosis - s/p treatment S/p AICD  Katie Kimberli Winne, PA-C 06/08/2022, 11:00 AM  [name]

## 2022-06-09 DIAGNOSIS — K922 Gastrointestinal hemorrhage, unspecified: Secondary | ICD-10-CM | POA: Diagnosis not present

## 2022-06-09 LAB — HEPATITIS B SURFACE ANTIBODY, QUANTITATIVE: Hep B S AB Quant (Post): 41 m[IU]/mL (ref >9.9)

## 2022-06-09 LAB — RENAL FUNCTION PANEL
Albumin: 3.6 g/dL (ref 3.5–5.0)
Anion gap: 15 (ref 5–15)
BUN: 34 mg/dL — ABNORMAL HIGH (ref 8–23)
CO2: 26 mmol/L (ref 22–32)
Calcium: 8.8 mg/dL — ABNORMAL LOW (ref 8.9–10.3)
Chloride: 90 mmol/L — ABNORMAL LOW (ref 98–111)
Creatinine, Ser: 6.6 mg/dL — ABNORMAL HIGH (ref 0.44–1.00)
GFR, Estimated: 6 mL/min — ABNORMAL LOW (ref >60)
Glucose, Bld: 101 mg/dL — ABNORMAL HIGH (ref 70–99)
Phosphorus: 4.8 mg/dL — ABNORMAL HIGH (ref 2.5–4.6)
Potassium: 4.2 mmol/L (ref 3.5–5.1)
Sodium: 131 mmol/L — ABNORMAL LOW (ref 135–145)

## 2022-06-09 LAB — HEMOGLOBIN AND HEMATOCRIT, BLOOD
HCT: 30.5 % — ABNORMAL LOW (ref 36.0–46.0)
Hemoglobin: 9.6 g/dL — ABNORMAL LOW (ref 12.0–15.0)

## 2022-06-09 MED ORDER — APIXABAN 2.5 MG PO TABS
2.5000 mg | ORAL_TABLET | Freq: Two times a day (BID) | ORAL | Status: DC
  Administered 2022-06-09: 2.5 mg via ORAL
  Filled 2022-06-09: qty 1

## 2022-06-09 MED ORDER — PENTAFLUOROPROP-TETRAFLUOROETH EX AERO
INHALATION_SPRAY | CUTANEOUS | Status: AC
  Filled 2022-06-09: qty 30

## 2022-06-09 NOTE — Progress Notes (Signed)
Received patient in bed to unit.  Alert and oriented.  Informed consent signed and in chart.   TX duration:  Patient tolerated well.  Transported back to the room  Alert, without acute distress.  Hand-off given to patient's nurse.   Access used: Avf Access issues: None  Total UF removed: 1.5L Medication(s) given: Hectorol Post HD VS: 113/93,72,16,96% Post HD weight: 62.8kg   [facility]

## 2022-06-09 NOTE — Progress Notes (Signed)
Mobility Specialist Progress Note:   06/09/22 1215  Mobility  Activity Ambulated independently in hallway  Level of Assistance Independent  Assistive Device None  Distance Ambulated (ft) 500 ft  Activity Response Tolerated well  Mobility Referral Yes  $Mobility charge 1 Mobility   Pt agreeable to mobility session. Required no physical assistance throughout. Asx during session. Pt left sitting EOB with all needs met.  Nelta Numbers Mobility Specialist Please contact via SecureChat or  Rehab office at [phone]

## 2022-06-09 NOTE — Progress Notes (Signed)
D/C order noted. Pt due for HD today. Contacted attending and nephrologist to inquire if pt is appropriate to receive out-pt HD today or tomorrow to avoid HD on day of d/c. Attending and nephrologist agreeable to out-pt HD today or tomorrow if clinic can accommodate. Contacted [facility] and advised clinic can treat pt today if pt arrives by 1:00 (it was 11:30 at that time). Clinic unable to treat tomorrow due to staffing and no appt available. Met with pt at bedside regarding the above. Pt contacted daughter who is unable to transport pt from hospital to clinic today due to being at work. Pt has no other transportation options. Pt to require inpt HD prior to d/c for this reason. Update provided to team.Inpt HD unit aware pt is for d/c and will require treatment prior to d/c. [facility] SW aware pt will d/c today and resume care on Monday.   Melven Sartorius Renal Navigator [phone]

## 2022-06-09 NOTE — Progress Notes (Signed)
Patient ID: Morgan Clay, female   DOB: 08/28/1945, 77 y.o.   MRN: [id number]    Progress Note   Subjective  Day # 3 CC; weakness, fatigue, melena and profound anemia in setting of chronic anticoagulation  Enteroscopy yesterday with finding of 2 small AVMs in the second portion of the duodenum and third portion of the duodenum which were treated with APC, and 2 medium sized AVMs in the jejunum also treated with APC Capsule was deployed into the duodenum  Capsule endoscopy interpretation-2 medium to large AVMs were found in the jejunum and bleeding, the first was seen at 13 minutes and 56 seconds, and the second was seen at 2 hours and 56 minutes distal to a remotely placed tattoo), there was also a very small AVM noted at 53-minute mark.  Hemoglobin 9.6 today stable up from 5.9 on admission  Patient says she feels fine, no complaints today and is awaiting dialysis prior to discharge today    Objective   Vital signs in last 24 hours: Temp:  [97.9 F (36.6 C)-99.7 F (37.6 C)] 97.9 F (36.6 C) (02/02 0828) Pulse Rate:  [69-76] 75 (02/02 0828) Resp:  [18] 18 (02/02 0828) BP: (103-122)/(39-73) 111/52 (02/02 0828) SpO2:  [98 %-99 %] 99 % (02/02 0443) Weight:  [63.3 kg] 63.3 kg (02/01 2051) Last BM Date : 06/08/22 General:    Elderly African-American female in NAD Heart:  Regular rate and rhythm; no murmurs Lungs: Respirations even and unlabored, lungs CTA bilaterally Abdomen:  Soft, nontender and nondistended. Normal bowel sounds. Extremities:  Without edema. Neurologic:  Alert and oriented,  grossly normal neurologically. Psych:  Cooperative. Normal mood and affect.  Intake/Output from previous day: 02/01 0701 - 02/02 0700 In: 645 [P.O.:357; I.V.:200] Out: 0  Intake/Output this shift: Total I/O In: 480 [P.O.:480] Out: 0   Lab Results: Recent Labs    06/07/22 1307 06/08/22 0719 06/08/22 2138 06/09/22 1015  WBC 8.7 6.4  --   --   HGB 5.9* 9.0* 9.4* 9.6*  HCT  18.6* 27.2* 28.0* 30.5*  PLT 221 186  --   --    BMET Recent Labs    06/07/22 1307 06/08/22 0719 06/09/22 1015  NA 132* 132* 131*  K 5.7* 3.7 4.2  CL 92* 91* 90*  CO2 26 31 26  GLUCOSE 106* 83 101*  BUN 47* 13 34*  CREATININE 7.72* 4.06* 6.60*  CALCIUM 8.5* 9.0 8.8*   LFT Recent Labs    06/09/22 1015  ALBUMIN 3.6   PT/INR No results for input(s): "LABPROT", "INR" in the last 72 hours.       Assessment / Plan:    #1 77-year-old African-American female with history of recurrent GI bleeding in the setting of chronic Eliquis.  Admitted with 3 to 4-day history of melena at home followed by progressive weakness and fatigue and found to have hemoglobin of 5.9 down from hemoglobin of 12 in December  Enteroscopy yesterday with APC of 2 duodenal AVMs and APC of 2 jejunal AVMs  Capsule was deployed at the time of enteroscopy, interpreted today and this shows 2 medium to large AVMs nonbleeding, 1 at 13 minutes 56 seconds and the second at 2 hours 56 minutes There is also a small AVM at 53 minutes.   Patient is to have dialysis prior to discharge home today  Okay for discharge from GI perspective Ok to resume Eliquis   We will arrange for outpatient follow-up in our office within the next couple of weeks,   will repeat hemoglobin at that time and will hope to get her scheduled for a single balloon enteroscopy for treatment of these more distal AVMs per Dr. Cirigliano.         Principal Problem:   Acute upper GI bleeding Active Problems:   Acquired hypothyroidism   Chronic diastolic CHF (congestive heart failure) (HCC)   Biventricular ICD (implantable cardioverter-defibrillator) in place   Type 2 diabetes, controlled, with renal manifestation (HCC)   Chronic obstructive pulmonary disease (HCC)   Hypertension associated with diabetes (HCC)   MDD (major depressive disorder), recurrent, in partial remission (HCC)   Diverticulitis of left colon status post robotic low  anterior to sigmoid resection 05/22/2018   Anemia   Gout   Persistent atrial fibrillation (HCC)   ESRD on dialysis (HCC)   Hepatic cirrhosis (HCC)   Polyneuropathy associated with underlying disease (HCC)   Glaucoma   Prolonged QT interval     LOS: 2 days   Zeeshan Korte PA-C 06/09/2022, 1:58 PM   

## 2022-06-09 NOTE — Progress Notes (Signed)
Subjective: Sitting up in bedside chair waiting for dialysis today no complaints.  Think she is going home after dialysis  Objective Vital signs in last 24 hours: Vitals:   06/08/22 1739 06/08/22 2051 06/09/22 0443 06/09/22 0828  BP: 119/61 122/73 (!) 103/39 (!) 111/52  Pulse: 76 69 75 75  Resp:  '18 18 18  '$ Temp: 98.1 F (36.7 C) 99.7 F (37.6 C) 98.3 F (36.8 C) 97.9 F (36.6 C)  TempSrc:  Oral Oral Oral  SpO2: 99% 98% 99%   Weight:  63.3 kg    Height:       Weight change: -0.868 kg  Physical Exam: General: Well-appearing adult female, pleasant NAD Heart: RRR no MRG Lungs: CTA, nonlabored Abdomen: NABS, soft NTND Extremities: No pedal edema  dialysis Access: Positive bruit L UA AVF   Dialysis Orders: [facility]  on MWF . EDW 56kg HD Bath 2K/2Ca  Time 3:30 Heparin none. Access LUE AVF BFR 400 DFR 600    Micera 225 mcg IVP (given 06/05/22) Hectoral 4 mcg IVP q HD Sensipar 30 mg PO q HD Korsuva 0.6 mL IVP q HD   Problem/Plan: ABLA/symptomatic anemia: Hgb 5.9 on admit.  9.4 this a.m. hx of AVM's // diverticular bleeds. S/p 2U PRBCs . GI consulted, going for small bowel enteroscopy 06/08/2022= required argon plasma fulguration duodenal  lesions ESRD: S/p HD admit,.  usual MWF schedule, next Tuesday no heparin. Hypertension/volume: BP stable, UF as tolerated with HD. Anemia  - as above, ESA just given as outpatient.  Hgb 9.4 Metabolic bone disease -  continue with home meds and renal diet  Nutrition - renal diet, carb modified. Hyperkalemia: On admit, improved s/p Lokelma x 1 and HD.  A.m. K3.7  Afib - Eliquis on hold. Hep C cirrhosis - s/p treatment S/p AICD   Ernest Haber, PA-C [facility] Beeper [phone] 06/09/2022,9:08 AM  LOS: 2 days   Labs: Basic Metabolic Panel: Recent Labs  Lab 06/07/22 1307 06/08/22 0719  NA 132* 132*  K 5.7* 3.7  CL 92* 91*  CO2 26 31  GLUCOSE 106* 83  BUN 47* 13  CREATININE 7.72* 4.06*  CALCIUM 8.5* 9.0  PHOS  --  3.7    Liver Function Tests: Recent Labs  Lab 06/08/22 0719  ALBUMIN 3.4*   No results for input(s): "LIPASE", "AMYLASE" in the last 168 hours. No results for input(s): "AMMONIA" in the last 168 hours. CBC: Recent Labs  Lab 06/07/22 1307 06/08/22 0719 06/08/22 2138  WBC 8.7 6.4  --   NEUTROABS 6.1  --   --   HGB 5.9* 9.0* 9.4*  HCT 18.6* 27.2* 28.0*  MCV 79.5* 81.9  --   PLT 221 186  --    Cardiac Enzymes: No results for input(s): "CKTOTAL", "CKMB", "CKMBINDEX", "TROPONINI" in the last 168 hours. CBG: Recent Labs  Lab 06/08/22 0135 06/08/22 1154  GLUCAP 97 157*    Studies/Results: DG Chest Port 1 View  Result Date: 06/07/2022 CLINICAL DATA:  Shortness of breath. EXAM: PORTABLE CHEST 1 VIEW COMPARISON:  May 16, 2022. FINDINGS: Stable cardiomegaly. Left-sided defibrillator is unchanged in position. Both lungs are clear. The visualized skeletal structures are unremarkable. IMPRESSION: No active disease. Electronically Signed   By: Marijo Conception M.D.   On: 06/07/2022 13:05   Medications:   sodium chloride   Intravenous Once   amiodarone  200 mg Oral Daily   apixaban  2.5 mg Oral BID   Chlorhexidine Gluconate Cloth  6 each Topical  Daily   cinacalcet  30 mg Oral Q M,W,F   doxercalciferol  4 mcg Intravenous Q M,W,F-HD   levothyroxine  125 mcg Oral QAC breakfast   sodium chloride flush  3 mL Intravenous Q12H

## 2022-06-09 NOTE — Discharge Summary (Signed)
Physician Discharge Summary  Verley Pariseau POE:[id number] DOB: 13-Jan-1946 DOA: 06/07/2022  PCP: Mackie Pai, PA-C  Admit date: 06/07/2022 Discharge date: 06/09/2022  Admitted From: Home Disposition: Home  Recommendations for Outpatient Follow-up:  Follow up with PCP in 1-2 weeks Follow-up with [facility] gastroenterology for results of capsule endoscopy Please obtain CBC in one week to reassess hemoglobin level, was 9.4 at time of discharge Please follow up on the following pending results: Results of capsule endoscopy  Home Health: No Equipment/Devices: None  Discharge Condition: Stable CODE STATUS: Full code Diet recommendation: Renal diet with 1500 mL fluid restriction  History of present illness:  Morgan Clay is a 77 y.o. female with past medical history significant for M2, HTN, atrial fibrillation on Eliquis, depression, hepatitis C cirrhosis, ESRD on HD MWF, peripheral neuropathy, glaucoma, history of diverticulitis s/p sigmoid resection, COPD/asthma, chronic diastolic congestive heart failure, hypothyroidism, anemia, history of biventricular ICD placement who presented to [facility] ED on 1/31 with rectal bleeding.  Patient reports 3 days of black diarrhea associated with lower abdominal pain and fatigue.  History of prior GI bleeding due to AVMs and diverticuli.  Reports last Eliquis dose 1/30 1 in the AM.  Reports last dialysis was on Monday.  Denies fever, no chills, no chest pain, no shortness of breath, no constipation, no nausea/vomiting.   In the ED, temperature 97.6 F, HR 84, RR 18, BP 152/56, SpO2 100% on room air.  WBC 8.7, hemoglobin 5.9, platelets 221.  Sodium 132, potassium 5.7, chloride 92, CO2 26, glucose 106, BUN 47, creatinine 7.72.  High sensitive troponin 43 followed by 45.  FOBT positive.  Chest x-ray with no active cardiopulmonary disease process.  GI and nephrology were consulted.  TRH consulted for admission for further evaluation management of symptomatic  anemia secondary to GI bleed in the setting of anticoagulant use.   Hospital course:  GI bleed Hx anemia of chronic medical/renal disease Patient presenting with 3-day history of dark stools associate with lower abdominal pain, fatigue.  Complicated by use of Eliquis, last dose a.m. of 1/31.  Hemoglobin 5.9 on admission which is down from 10 2 months prior.  History of previous GI bleed secondary to AVMs and diverticuli.  [facility] gastroenterology was consulted and followed during hospital course.  Patient was transfused 2 unit PRBCs.  Patient underwent small bowel enteroscopy on 06/08/2022 with findings of 2 telangiectasias duodenum and 2 telangiectasias in the jejunum status post APC.  Patient also underwent capsule endoscopy that was pending results at time of discharge.  Patient's hemoglobin improved and was 9.4 at time of discharge.  Outpatient follow-up with GI for capsule endoscopy results.  Recommend CBC 1 week.  Hemoglobin 9.4 at time of discharge.  May resume Eliquis on discharge per GI.   Hyperkalemia: Resolved Potassium 5.7 on admission.  Missed last HD session.  Received Lokelma.  Continue management with HD.   Hyponatremia Etiology likely volume overload in the setting of missed hemodialysis.  Sodium 132 on admission.  Continue management with HD.   Type 2 diabetes mellitus Last hemoglobin A1c 4.14 October 2021, well-controlled.  Diet controlled at baseline.   Essential hypertension Currently not on antihypertensives outpatient.   Chronic diastolic congestive heart failure, compensated TTE 12/22/2021 with LVEF 50-55%, mild concentric LVH, no LV regional wall motion normalities, diastolic parameters indeterminate, RV moderately enlarged, LA severely dilated, moderate-severe TR, mild MR, IVC dilated. Volume management with HD   Atrial fibrillation on anticoagulation Continue amiodarone 200 mg p.o. daily and resume Eliquis 2.5  mg p.o. twice daily.   ESRD on HD MWF Nephrology was  consulted and followed for continue HD while inpatient   Hepatitis C cirrhosis Continue outpatient follow-up with [facility] hepatology clinic   COPD/asthma Not in acute exacerbation, oxygenating well on room air.    Hypothyroidism Levothyroxine 125 mcg p.o. daily  Discharge Diagnoses:  Principal Problem:   Acute upper GI bleeding Active Problems:   Acquired hypothyroidism   Chronic diastolic CHF (congestive heart failure) (HCC)   Biventricular ICD (implantable cardioverter-defibrillator) in place   Type 2 diabetes, controlled, with renal manifestation (HCC)   Chronic obstructive pulmonary disease (HCC)   Hypertension associated with diabetes ([place])   MDD (major depressive disorder), recurrent, in partial remission ([place])   Diverticulitis of left colon status post robotic low anterior to sigmoid resection 05/22/2018   Anemia   Gout   Persistent atrial fibrillation (HCC)   ESRD on dialysis (HCC)   Hepatic cirrhosis (HCC)   Polyneuropathy associated with underlying disease (HCC)   Glaucoma   Prolonged QT interval    Discharge Instructions  Discharge Instructions     Call MD for:  difficulty breathing, headache or visual disturbances   Complete by: As directed    Call MD for:  extreme fatigue   Complete by: As directed    Call MD for:  persistant dizziness or light-headedness   Complete by: As directed    Call MD for:  persistant nausea and vomiting   Complete by: As directed    Call MD for:  severe uncontrolled pain   Complete by: As directed    Call MD for:  temperature >100.4   Complete by: As directed    Diet - low sodium heart healthy   Complete by: As directed    Increase activity slowly   Complete by: As directed       Allergies as of 06/09/2022   No Known Allergies      Medication List     STOP taking these medications    benzonatate 100 MG capsule Commonly known as: TESSALON   diazepam 5 MG tablet Commonly known as: Valium   fluticasone 50 MCG/ACT  nasal spray Commonly known as: FLONASE   fluticasone-salmeterol 250-50 MCG/ACT Aepb Commonly known as: ADVAIR   HYDROcodone bit-homatropine 5-1.5 MG/5ML syrup Commonly known as: HYCODAN   hydrOXYzine 10 MG tablet Commonly known as: ATARAX   lidocaine-prilocaine cream Commonly known as: EMLA   pantoprazole 40 MG tablet Commonly known as: Protonix   Xiidra 5 % Soln Generic drug: Lifitegrast       TAKE these medications    albuterol 108 (90 Base) MCG/ACT inhaler Commonly known as: VENTOLIN HFA Inhale 2 puffs into the lungs every 6 (six) hours as needed for wheezing or shortness of breath.   amiodarone 200 MG tablet Commonly known as: PACERONE Take 1 tablet (200 mg total) by mouth daily.   apixaban 2.5 MG Tabs tablet Commonly known as: Eliquis Take 1 tablet (2.5 mg total) by mouth 2 (two) times daily.   Atrovent HFA 17 MCG/ACT inhaler Generic drug: ipratropium Inhale 2 puffs into the lungs every 6 (six) hours as needed for wheezing.   cyclobenzaprine 5 MG tablet Commonly known as: FLEXERIL Take 5 mg by mouth at bedtime as needed for muscle spasms.   gabapentin 300 MG capsule Commonly known as: NEURONTIN Take 1 capsule (300 mg total) by mouth daily. Fill one rx. Computer may have sent over 2 What changed: additional instructions   levothyroxine 125 MCG  tablet Commonly known as: Synthroid Take 1 tablet (125 mcg total) by mouth daily before breakfast.   nitroGLYCERIN 0.4 MG SL tablet Commonly known as: NITROSTAT Place 1 tablet (0.4 mg total) under the tongue every 5 (five) minutes as needed for chest pain.   rOPINIRole 0.25 MG tablet Commonly known as: REQUIP Take 0.25 mg by mouth at bedtime.   traZODone 50 MG tablet Commonly known as: DESYREL Take 0.5-1 tablets (25-50 mg total) by mouth at bedtime as needed for sleep.        Follow-up Information     Saguier, [name]. Schedule an appointment as soon as possible for a visit in 1 week(s).    Specialties: Internal Medicine, Family Medicine Contact information: [address] [place] [address] [phone]                No Known Allergies  Consultations: [facility] Nephrology   Procedures/Studies: [facility] Chest Port 1 View  Result Date: 06/07/2022 CLINICAL DATA:  Shortness of breath. EXAM: PORTABLE CHEST 1 VIEW COMPARISON:  May 16, 2022. FINDINGS: Stable cardiomegaly. Left-sided defibrillator is unchanged in position. Both lungs are clear. The visualized skeletal structures are unremarkable. IMPRESSION: No active disease. Electronically Signed   By: Marijo Conception M.D.   On: 06/07/2022 13:05   CUP PACEART INCLINIC DEVICE CHECK  Result Date: 05/18/2022 CRT-D device check in office. Thresholds and sensing consistent with previous device measurements. Lead impedance trends stable over time. Multiple AT/AF episodes. No ventricular arrhythmia episodes recorded. Patient bi-ventricularly pacing 99.6% of the time. Device programmed with appropriate safety margins. Heart failure diagnostics reviewed and trends are stable for patient. Audible/vibratory alerts demonstrated for patient. No changes made this session. Battery at [facility] Generator change scheduled. Patient enrolled in remote follow up. Plan to check device remotely in 3 months and see in office in 6 months. Patient education completed including shock plan.  DG Chest 2 View  Result Date: 05/16/2022 CLINICAL DATA:  Amiodarone surveillance. EXAM: CHEST - 2 VIEW COMPARISON:  March 27, 2022 FINDINGS: There is stable multi lead AICD positioning. The cardiac silhouette is mildly enlarged and unchanged in size. There is mild calcification of the aortic arch. There is no evidence of acute infiltrate, pleural effusion or pneumothorax. The visualized skeletal structures are unremarkable. IMPRESSION: Mild cardiomegaly without evidence of acute or active cardiopulmonary disease. Electronically Signed    By: Virgina Norfolk M.D.   On: 05/16/2022 19:47     Subjective: Patient seen examined bedside, resting comfortably.  Sitting on bedside couch.  States she is ready to go home today.  Hemoglobin up to 9.4.  No specific complaints this morning.  Discussed will go home after she receives dialysis today.  Denies headache, no dizziness, no chest pain, palpitations, no shortness of breath, no abdominal pain, no fever/chills/night sweats, no nausea cefonicid diarrhea, no focal weakness, no fatigue, no paresthesias.  No acute events overnight per nursing staff.  Discharge Exam: Vitals:   06/09/22 0443 06/09/22 0828  BP: (!) 103/39 (!) 111/52  Pulse: 75 75  Resp: 18 18  Temp: 98.3 F (36.8 C) 97.9 F (36.6 C)  SpO2: 99%    Vitals:   06/08/22 1739 06/08/22 2051 06/09/22 0443 06/09/22 0828  BP: 119/61 122/73 (!) 103/39 (!) 111/52  Pulse: 76 69 75 75  Resp:  '18 18 18  '$ Temp: 98.1 F (36.7 C) 99.7 F (37.6 C) 98.3 F (36.8 C) 97.9 F (36.6 C)  TempSrc:  Oral Oral  Oral  SpO2: 99% 98% 99%   Weight:  63.3 kg    Height:        Physical Exam: GEN: NAD, alert and oriented x 3, wd/wn HEENT: NCAT, PERRL, EOMI, sclera clear, MMM PULM: CTAB w/o wheezes/crackles, normal respiratory effort, on room air CV: RRR w/o M/G/R GI: abd soft, NTND, NABS, no R/G/M MSK: no peripheral edema, muscle strength globally intact 5/5 bilateral upper/lower extremities, LUE AVF noted NEURO: CN II-XII intact, no focal deficits, sensation to light touch intact PSYCH: normal mood/affect Integumentary: dry/intact, no rashes or wounds    The results of significant diagnostics from this hospitalization (including imaging, microbiology, ancillary and laboratory) are listed below for reference.     Microbiology: No results found for this or any previous visit (from the past 240 hour(s)).   Labs: BNP (last 3 results) Recent Labs    03/27/22 1650  BNP 1,007.1*   Basic Metabolic Panel: Recent Labs  Lab  06/07/22 1307 06/08/22 0719  NA 132* 132*  K 5.7* 3.7  CL 92* 91*  CO2 26 31  GLUCOSE 106* 83  BUN 47* 13  CREATININE 7.72* 4.06*  CALCIUM 8.5* 9.0  PHOS  --  3.7   Liver Function Tests: Recent Labs  Lab 06/08/22 0719  ALBUMIN 3.4*   No results for input(s): "LIPASE", "AMYLASE" in the last 168 hours. No results for input(s): "AMMONIA" in the last 168 hours. CBC: Recent Labs  Lab 06/07/22 1307 06/08/22 0719 06/08/22 2138  WBC 8.7 6.4  --   NEUTROABS 6.1  --   --   HGB 5.9* 9.0* 9.4*  HCT 18.6* 27.2* 28.0*  MCV 79.5* 81.9  --   PLT 221 186  --    Cardiac Enzymes: No results for input(s): "CKTOTAL", "CKMB", "CKMBINDEX", "TROPONINI" in the last 168 hours. BNP: Invalid input(s): "POCBNP" CBG: Recent Labs  Lab 06/08/22 0135 06/08/22 1154  GLUCAP 97 157*   D-Dimer No results for input(s): "DDIMER" in the last 72 hours. Hgb A1c No results for input(s): "HGBA1C" in the last 72 hours. Lipid Profile No results for input(s): "CHOL", "HDL", "LDLCALC", "TRIG", "CHOLHDL", "LDLDIRECT" in the last 72 hours. Thyroid function studies No results for input(s): "TSH", "T4TOTAL", "T3FREE", "THYROIDAB" in the last 72 hours.  Invalid input(s): "FREET3" Anemia work up No results for input(s): "VITAMINB12", "FOLATE", "FERRITIN", "TIBC", "IRON", "RETICCTPCT" in the last 72 hours. Urinalysis    Component Value Date/Time   COLORURINE YELLOW 03/05/2018 0902   APPEARANCEUR CLEAR 03/05/2018 0902   LABSPEC 1.008 03/05/2018 0902   PHURINE 5.0 03/05/2018 0902   GLUCOSEU NEGATIVE 03/05/2018 0902   HGBUR NEGATIVE 03/05/2018 0902   HGBUR negative 02/28/2008 0000   BILIRUBINUR NEGATIVE 03/05/2018 0902   KETONESUR 5 (A) 03/05/2018 0902   PROTEINUR NEGATIVE 03/05/2018 0902   UROBILINOGEN 1.0 07/19/2010 1752   NITRITE NEGATIVE 03/05/2018 0902   LEUKOCYTESUR NEGATIVE 03/05/2018 0902   Sepsis Labs Recent Labs  Lab 06/07/22 1307 06/08/22 0719  WBC 8.7 6.4   Microbiology No results  found for this or any previous visit (from the past 240 hour(s)).   Time coordinating discharge: Over 30 minutes  SIGNED:   Donnamarie Poag [place] DO  [facility] 06/09/2022, 11:07 AM

## 2022-06-09 NOTE — TOC Transition Note (Signed)
Transition of Care [facility]) - CM/SW Discharge Note   Patient Details  Name: Morgan Clay MRN: [id number] Date of Birth: November 14, 1945  [facility]) CM/SW Contact:  Tom-Johnson, Renea Ee, RN Phone Number: 06/09/2022, 11:21 AM   Clinical Narrative:     Patient is scheduled for discharge today. Outpatient f/u and appointments on AVS. No TOC needs or recommendations noted. Denies any needs. Family to transport at discharge. No further TOC needs noted.        Final next level of care: Home/Self Care Barriers to Discharge: Barriers Resolved   Patient Goals and CMS Choice CMS Medicare.gov Compare Post Acute Care list provided to:: Patient Choice offered to / list presented to : NA  Discharge Placement                  Patient to be transferred to facility by: Family      Discharge Plan and Services Additional resources added to the After Visit Summary for                  DME Arranged: N/A DME Agency: NA       HH Arranged: NA HH Agency: NA        Social Determinants of Health (SDOH) Interventions SDOH Screenings   Food Insecurity: No Food Insecurity (06/07/2022)  Housing: Low Risk  (06/07/2022)  Transportation Needs: No Transportation Needs (06/07/2022)  Utilities: Not At Risk (06/07/2022)  Alcohol Screen: Low Risk  (07/15/2021)  Depression (PHQ2-9): Low Risk  (04/06/2022)  Financial Resource Strain: Low Risk  (07/15/2021)  Social Connections: Moderately Isolated (07/15/2021)  Stress: No Stress Concern Present (07/15/2021)  Tobacco Use: Low Risk  (06/08/2022)     Readmission Risk Interventions    06/29/2020    9:18 AM  Readmission Risk Prevention Plan  Transportation Screening Complete  HRI or Home Care Consult Complete  Social Work Consult for [place] Planning/Counseling Complete  Palliative Care Screening Not Applicable  Medication Review Press photographer) Complete

## 2022-06-09 NOTE — H&P (View-Only) (Signed)
Patient ID: Jakalyn Renna, female   DOB: September 02, 1945, 77 y.o.   MRN: [id number]    Progress Note   Subjective  Day # 3 CC; weakness, fatigue, melena and profound anemia in setting of chronic anticoagulation  Enteroscopy yesterday with finding of 2 small AVMs in the second portion of the duodenum and third portion of the duodenum which were treated with APC, and 2 medium sized AVMs in the jejunum also treated with APC Capsule was deployed into the duodenum  Capsule endoscopy interpretation-2 medium to large AVMs were found in the jejunum and bleeding, the first was seen at 13 minutes and 56 seconds, and the second was seen at 2 hours and 56 minutes distal to a remotely placed tattoo), there was also a very small AVM noted at 53-minute mark.  Hemoglobin 9.6 today stable up from 5.9 on admission  Patient says she feels fine, no complaints today and is awaiting dialysis prior to discharge today    Objective   Vital signs in last 24 hours: Temp:  [97.9 F (36.6 C)-99.7 F (37.6 C)] 97.9 F (36.6 C) (02/02 0828) Pulse Rate:  [69-76] 75 (02/02 0828) Resp:  [18] 18 (02/02 0828) BP: (103-122)/(39-73) 111/52 (02/02 0828) SpO2:  [98 %-99 %] 99 % (02/02 0443) Weight:  [63.3 kg] 63.3 kg (02/01 2051) Last BM Date : 06/08/22 General:    Elderly African-American female in NAD Heart:  Regular rate and rhythm; no murmurs Lungs: Respirations even and unlabored, lungs CTA bilaterally Abdomen:  Soft, nontender and nondistended. Normal bowel sounds. Extremities:  Without edema. Neurologic:  Alert and oriented,  grossly normal neurologically. Psych:  Cooperative. Normal mood and affect.  Intake/Output from previous day: 02/01 0701 - 02/02 0700 In: [id number] [P.O.:357; I.V.:200] Out: 0  Intake/Output this shift: Total I/O In: 480 [P.O.:480] Out: 0   Lab Results: Recent Labs    06/07/22 1307 06/08/22 0719 06/08/22 2138 06/09/22 1015  WBC 8.7 6.4  --   --   HGB 5.9* 9.0* 9.4* 9.6*  HCT  18.6* 27.2* 28.0* 30.5*  PLT 221 186  --   --    BMET Recent Labs    06/07/22 1307 06/08/22 0719 06/09/22 1015  NA 132* 132* 131*  K 5.7* 3.7 4.2  CL 92* 91* 90*  CO2 26 31 26  $ GLUCOSE 106* 83 101*  BUN 47* 13 34*  CREATININE 7.72* 4.06* 6.60*  CALCIUM 8.5* 9.0 8.8*   LFT Recent Labs    06/09/22 1015  ALBUMIN 3.6   PT/INR No results for input(s): "LABPROT", "INR" in the last 72 hours.       Assessment / Plan:    #65 77 year old African-American female with history of recurrent GI bleeding in the setting of chronic Eliquis.  Admitted with 3 to 4-day history of melena at home followed by progressive weakness and fatigue and found to have hemoglobin of 5.9 down from hemoglobin of 12 in December  Enteroscopy yesterday with APC of 2 duodenal AVMs and APC of 2 jejunal AVMs  Capsule was deployed at the time of enteroscopy, interpreted today and this shows 2 medium to large AVMs nonbleeding, 1 at 13 minutes 56 seconds and the second at 2 hours 56 minutes There is also a small AVM at 53 minutes.   Patient is to have dialysis prior to discharge home today  Okay for discharge from [facility] to resume Eliquis   We will arrange for outpatient follow-up in our office within the next couple of weeks,  will repeat hemoglobin at that time and will hope to get her scheduled for a single balloon enteroscopy for treatment of these more distal AVMs per Dr. Bryan Lemma.         Principal Problem:   Acute upper GI bleeding Active Problems:   Acquired hypothyroidism   Chronic diastolic CHF (congestive heart failure) (HCC)   Biventricular ICD (implantable cardioverter-defibrillator) in place   Type 2 diabetes, controlled, with renal manifestation (HCC)   Chronic obstructive pulmonary disease ([place])   Hypertension associated with diabetes ([place])   MDD (major depressive disorder), recurrent, in partial remission ([place])   Diverticulitis of left colon status post robotic low  anterior to sigmoid resection 05/22/2018   Anemia   Gout   Persistent atrial fibrillation (HCC)   ESRD on dialysis ([place])   Hepatic cirrhosis (HCC)   Polyneuropathy associated with underlying disease ([place])   Glaucoma   Prolonged QT interval     LOS: 2 days   Maurissa Ambrose PA-C 06/09/2022, 1:58 PM

## 2022-06-10 NOTE — Telephone Encounter (Signed)
Transition of care contact from inpatient facility  Date of discharge: 06/09/2022 Date of contact: 06/10/2022 Method: Phone Spoke to: Patient  Patient contacted to discuss transition of care from recent inpatient hospitalization. Patient was admitted to [facility] from 06/07/2022 to 06/09/2012... with discharge diagnosis of .Marland KitchenGI Bleed, Hyperkalemia Atrial fib on anticoagulation .  Medication changes were reviewed.  Patient will follow up with his/her outpatient HD unit on: MWF schedule at [facility]

## 2022-06-10 NOTE — Telephone Encounter (Signed)
tcm

## 2022-06-11 LAB — CUP PACEART REMOTE DEVICE CHECK
Battery Remaining Longevity: 1 mo — CL
Battery Voltage: 2.66 V
Brady Statistic AP VP Percent: 0.49 %
Brady Statistic AP VS Percent: 0.02 %
Brady Statistic AS VP Percent: 99.32 %
Brady Statistic AS VS Percent: 0.17 %
Brady Statistic RA Percent Paced: 0.52 %
Brady Statistic RV Percent Paced: 99.5 %
Date Time Interrogation Session: 20240203072508
HighPow Impedance: 44 Ohm
HighPow Impedance: 75 Ohm
Implantable Lead Connection Status: 753985
Implantable Lead Connection Status: 753985
Implantable Lead Connection Status: 753985
Implantable Lead Implant Date: 20080818
Implantable Lead Implant Date: 20080818
Implantable Lead Implant Date: 20080818
Implantable Lead Location: 753858
Implantable Lead Location: 753859
Implantable Lead Location: 753860
Implantable Lead Model: 4193
Implantable Lead Model: 5076
Implantable Lead Model: 6947
Implantable Pulse Generator Implant Date: 20170531
Lead Channel Impedance Value: 266 Ohm
Lead Channel Impedance Value: 323 Ohm
Lead Channel Impedance Value: 399 Ohm
Lead Channel Impedance Value: 4047 Ohm
Lead Channel Impedance Value: 4047 Ohm
Lead Channel Impedance Value: 494 Ohm
Lead Channel Pacing Threshold Amplitude: 1 V
Lead Channel Pacing Threshold Amplitude: 1.125 V
Lead Channel Pacing Threshold Amplitude: 1.875 V
Lead Channel Pacing Threshold Pulse Width: 0.4 ms
Lead Channel Pacing Threshold Pulse Width: 0.4 ms
Lead Channel Pacing Threshold Pulse Width: 0.4 ms
Lead Channel Sensing Intrinsic Amplitude: 3.75 mV
Lead Channel Sensing Intrinsic Amplitude: 3.75 mV
Lead Channel Sensing Intrinsic Amplitude: 9 mV
Lead Channel Sensing Intrinsic Amplitude: 9 mV
Lead Channel Setting Pacing Amplitude: 2 V
Lead Channel Setting Pacing Amplitude: 2.5 V
Lead Channel Setting Pacing Amplitude: 2.5 V
Lead Channel Setting Pacing Pulse Width: 0.4 ms
Lead Channel Setting Pacing Pulse Width: 0.4 ms
Lead Channel Setting Sensing Sensitivity: 0.3 mV
Zone Setting Status: 755011

## 2022-06-11 NOTE — Progress Notes (Deleted)
Morgan Clay, female    DOB: 1946/04/10   MRN: [id number]   Brief patient profile:  25   yobf never smoker from [place]  referred to pulmonary clinic 05/16/2022 for post hosp f/u   Admit Date: 03/27/2022 Discharge date: 03/29/2022  Brief Summary: 77 year old-with history of ESRD on HD-A-fib on Eliquis, chronic HFpEF-s/p AICD placement, , hepatitis C related cirrhosis, HTN-who was evaluated for shortness of breath/cough for several weeks.   Brief Hospital Course: Shortness of breath/chronic cough-likely multifactorial due to bronchospasm in the setting of possible asthma exacerbation, GERD Evaluated by PCCM-started on Yupelri/Brovana/budesonide nebulizers with some improvement overnight.   Requesting discharge today-she feels somewhat better.  I did not hear her coughing in the room at all.  She is comfortably on room air.  On exam-some scattered rhonchi but overall clear to auscultation. Will place her on inhaler regimen-including inhaled steroids and have her follow-up with Dr. Melvyn Novas as outlined by [facility]. Note-recent COVID/influenza PCR were negative.   ESRD on HD MWF Nephrology followed during this hospitalization Resume usual outpatient dialysis regimen   Chronic combined systolic/diastolic heart failure Euvolemic Volume removal with HD   PAF Continue amiodarone-CXR without any infiltrates-not thought to have ILD at this point. Continue Eliquis.   Hypothyroidism Continue Synthroid     Discharge Diagnoses:  Principal Problem:   Dyspnea   Acquired hypothyroidism   Biventricular ICD (implantable cardioverter-defibrillator) in place   Cough   Persistent atrial fibrillation (HCC)   ESRD on dialysis (HCC)   Chronic combined systolic (congestive) and diastolic (congestive) heart failure (HCC)   Glaucoma   HYPERTENSION, BENIGN   Prolonged QT interval    History of Present Illness  05/16/2022  Pulmonary/ 1st office eval/Marlow Hendrie maint on advair dpi Chief Complaint   Patient presents with   Follow-up    Still has a cough (dry)   Dyspnea:  baseline slow pace / does some  housework but still needs hc  parking/ leans on shopping cart  Cough: no pattern more likely during the day /no globus  Sleep: flat bed one or two pillows  SABA use: not using while on advair Rec Try off advair (powdered inhaler) Only use your albuterol as a rescue medication Also  Ok to try albuterol 15 min before an activity (on alternating days)  that you know would usually make you short of breath  Work on inhaler technique:  Please schedule a follow up office visit in 4 weeks, sooner if needed  with all medications /inhalers/ solutions in hand    06/13/2022  f/u ov/Peniel Biel re: uacs/doe   maint on ***  No chief complaint on file.   Dyspnea:  *** Cough: *** Sleeping: *** SABA use: *** 02: *** Covid status:   *** Lung cancer screening :  ***    No obvious day to day or daytime variability or assoc excess/ purulent sputum or mucus plugs or hemoptysis or cp or chest tightness, subjective wheeze or overt sinus or hb symptoms.   *** without nocturnal  or early am exacerbation  of respiratory  c/o's or need for noct saba. Also denies any obvious fluctuation of symptoms with weather or environmental changes or other aggravating or alleviating factors except as outlined above   No unusual exposure hx or h/o childhood pna/ asthma or knowledge of premature birth.  Current Allergies, Complete Past Medical History, Past Surgical History, Family History, and Social History were reviewed in [employer] record.  ROS  The following are not active  complaints unless bolded Hoarseness, sore throat, dysphagia, dental problems, itching, sneezing,  nasal congestion or discharge of excess mucus or purulent secretions, ear ache,   fever, chills, sweats, unintended wt loss or wt gain, classically pleuritic or exertional cp,  orthopnea pnd or arm/hand swelling  or leg swelling,  presyncope, palpitations, abdominal pain, anorexia, nausea, vomiting, diarrhea  or change in bowel habits or change in bladder habits, change in stools or change in urine, dysuria, hematuria,  rash, arthralgias, visual complaints, headache, numbness, weakness or ataxia or problems with walking or coordination,  change in mood or  memory.        No outpatient medications have been marked as taking for the 06/13/22 encounter (Appointment) with Tanda Rockers, MD.            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 ([place]) 09/24/2020   Chronic obstructive pulmonary disease ([place])    Chronic systolic congestive heart failure ([place])    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 (HCV) ([place])    HEPATITIS C - s/p treatment with Harvoni, saw hepatology, Dawn Drazek   Diverticulitis of left colon status post robotic low anterior to sigmoid resection 05/22/2018 07/31/2017   End stage renal disease ([place]) 03/22/2021   Glaucoma    Gout    History of blood transfusion ~ 11/2017   Hypertension associated with diabetes ([place]) 06/07/2017   Hypothyroidism    LBBB (left bundle branch block)    Malnutrition of moderate degree 07/09/2020   MDD (major depressive disorder), recurrent, in partial remission ([place]) 06/07/2017   OBSTRUCTIVE SLEEP APNEA 11/14/2007   no CPAP   OSTEOPENIA 09/30/2008   Persistent atrial fibrillation ([place]) 02/06/2020   Polyneuropathy associated with underlying disease ([place]) 06/09/2021   Pulmonary  hypertension ([place]) on echocardiogram 01/14/2018   Secondary esophageal varices without bleeding ([place]) 12/23/2020   Formatting of this note might be different from the original. Patient had trace esophageal varices during small bowel enteroscopy in October 2021, follow-up EGD in January 2022 with no varices.  She previously had GI bleeding thought to be from AVM versus GAVE.  She is no longer on carvedilol and will need surveillance endoscopy.  Patient was previously followed at [facility] and I have asked her    Secondary hypercoagulable state ([place]) 02/06/2020   Type 2 diabetes, controlled, with renal manifestation ([place]) 11/24/2013       Objective:     Wt Readings from Last 3 Encounters:  06/08/22 139 lb 8.8 oz (63.3 kg)  05/18/22 130 lb 12.8 oz (59.3 kg)  05/16/22 130 lb 6.4 oz (59.1 kg)      Vital signs reviewed  06/13/2022  - Note at rest 02 sats  ***% on ***   General appearance:    ***     Assessment

## 2022-06-12 ENCOUNTER — Telehealth: Payer: Self-pay

## 2022-06-12 DIAGNOSIS — D631 Anemia in chronic kidney disease: Secondary | ICD-10-CM | POA: Diagnosis not present

## 2022-06-12 DIAGNOSIS — D509 Iron deficiency anemia, unspecified: Secondary | ICD-10-CM | POA: Diagnosis not present

## 2022-06-12 DIAGNOSIS — E119 Type 2 diabetes mellitus without complications: Secondary | ICD-10-CM | POA: Diagnosis not present

## 2022-06-12 DIAGNOSIS — N2581 Secondary hyperparathyroidism of renal origin: Secondary | ICD-10-CM | POA: Diagnosis not present

## 2022-06-12 DIAGNOSIS — Z992 Dependence on renal dialysis: Secondary | ICD-10-CM | POA: Diagnosis not present

## 2022-06-12 DIAGNOSIS — N186 End stage renal disease: Secondary | ICD-10-CM | POA: Diagnosis not present

## 2022-06-12 DIAGNOSIS — L299 Pruritus, unspecified: Secondary | ICD-10-CM | POA: Diagnosis not present

## 2022-06-12 NOTE — Telephone Encounter (Signed)
-----   Message from [place], DO sent at 06/09/2022  3:23 PM EST ----- Regarding: RE: single balloon, and  office follow up Chart reviewed. Seems reasonable to attempt single balloon enteroscopy.   Mickel Baas, can you please start looking for a 90-minute spot at [facility] for single balloon for this patient with me while [name] arranges for outpatient OV. Thanks.   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 Being discharged today, she had Enteroscopy yesterday with APC to duodenal and to jejunal AVMs  Capsule today with finding of 2 good-sized AVMs in the jejunum, beyond reach of enteroscopy yesterday  Would like to get her set up with Dr. Bryan Lemma for single balloon enteroscopy as an outpatient, she would have to come off of Plavix pre procedure  , If you could get her an office follow-up with me within the next couple of weeks that would be great I am assuming Dr. Bryan Lemma is booked out for office appointments several weeks-I can see, and hopefully at that time would know when Dr. Bryan Lemma could do a single balloon  Please call patient Monday with the appointment - thanks

## 2022-06-12 NOTE — Telephone Encounter (Signed)
Transition Care Management Unsuccessful Follow-up Telephone Call  Date of discharge and from where:  [facility] 06/09/2022  Attempts:  1st Attempt  Reason for unsuccessful TCM follow-up call:  Left voice message Juanda Crumble, [place] Direct Dial [phone]

## 2022-06-12 NOTE — Telephone Encounter (Signed)
Called to offer an appointment with Nicoletta Ba, PA on 06/23/22 at 1:30 pm. No answer. Left information on her voicemail, and my name, asking she return my call.

## 2022-06-13 ENCOUNTER — Ambulatory Visit: Payer: Medicare Other | Admitting: Internal Medicine

## 2022-06-13 NOTE — Telephone Encounter (Signed)
Spoke with the patient's daughter. The patient has dialysis on M,W,F. She would not be able to come for an appointment on those days. Dialysis ends at 3:30 pm. She can come for an appointment on a Tuesday or Thursday. [name] Esterwood's first opening will be in March. The same is true of Dr Bryan Lemma.  How do you want to proceed?

## 2022-06-13 NOTE — Telephone Encounter (Signed)
Transition Care Management Unsuccessful Follow-up Telephone Call  Date of discharge and from where:  [facility] 06/09/2022  Attempts:  2nd Attempt  Reason for unsuccessful TCM follow-up call:  Left voice message Juanda Crumble, [place] Direct Dial [phone]

## 2022-06-14 ENCOUNTER — Telehealth: Payer: Self-pay

## 2022-06-14 ENCOUNTER — Other Ambulatory Visit: Payer: Self-pay

## 2022-06-14 DIAGNOSIS — Z992 Dependence on renal dialysis: Secondary | ICD-10-CM | POA: Diagnosis not present

## 2022-06-14 DIAGNOSIS — N186 End stage renal disease: Secondary | ICD-10-CM | POA: Diagnosis not present

## 2022-06-14 DIAGNOSIS — K5520 Angiodysplasia of colon without hemorrhage: Secondary | ICD-10-CM

## 2022-06-14 DIAGNOSIS — D509 Iron deficiency anemia, unspecified: Secondary | ICD-10-CM | POA: Diagnosis not present

## 2022-06-14 DIAGNOSIS — L299 Pruritus, unspecified: Secondary | ICD-10-CM | POA: Diagnosis not present

## 2022-06-14 DIAGNOSIS — N2581 Secondary hyperparathyroidism of renal origin: Secondary | ICD-10-CM | POA: Diagnosis not present

## 2022-06-14 DIAGNOSIS — D631 Anemia in chronic kidney disease: Secondary | ICD-10-CM | POA: Diagnosis not present

## 2022-06-14 DIAGNOSIS — E119 Type 2 diabetes mellitus without complications: Secondary | ICD-10-CM | POA: Diagnosis not present

## 2022-06-14 DIAGNOSIS — D649 Anemia, unspecified: Secondary | ICD-10-CM

## 2022-06-14 NOTE — Telephone Encounter (Signed)
   Patient Name: Morgan Clay  DOB: June 29, 1945 MRN: [id number]  Primary Cardiologist: Loralie Champagne, MD  Clinical pharmacists have reviewed the patient's past medical history, labs, and current medications as part of preoperative protocol coverage. The following recommendations have been made:  Patient with diagnosis of afib on Eliquis for anticoagulation.     Procedure: Balloon enteroscopy  Date of procedure: 06/21/22     CHA2DS2-VASc Score = 5  This indicates a 7.2% annual risk of stroke. The patient's score is based upon: CHF History: 1 HTN History: 1 Diabetes History: 1 Stroke History: 0 Vascular Disease History: 0 Age Score: 1 Gender Score: 1     Patient is an ESRD patient on HD   Per office protocol, patient can hold Eliquis for 2 days prior to procedure. Please resume Eliquis as soon as possible postprocedure, at the discretion of the surgeon.   I will route this recommendation to the requesting party via Epic fax function and remove from pre-op pool.  Please call with questions.  Lenna Sciara, NP 06/14/2022, 4:41 PM

## 2022-06-14 NOTE — Telephone Encounter (Signed)
-----   Message from Morgan Ferguson, PA-C sent at 06/14/2022 11:30 AM EST ----- Regarding: RE: single balloon, and  office follow up  I don't think 2/14 is too soon if she gets off plavix  for 5 days prior to procedure - I Don"t think the battery change is an issue or reason to wait ----- Message ----- From: Greggory Keen, LPN Sent: 01/08/4267   5:06 PM EST To: Morgan Ferguson, PA-C; Marice Potter, RN Subject: RE: single balloon, and  office follow up      Hervey Ard. Too soon. Unless [name] feels otherwise.  [name], She is having a generator change in the cath lab 07/21/22.  Your guidance please. ----- Message ----- From: Marice Potter, RN Sent: 06/09/2022   4:12 PM EST To: Greggory Keen, LPN Subject: FW: single balloon, and  office follow up      Annie Paras, 2/14 would be too soon for this procedure right because I have to get clearance for Aspirin and plavix and make sure he still has enough time to hold everything. Or what do you think?  ----- Message ----- From: Lavena Bullion, DO Sent: 06/09/2022   3:24 PM EST To: Morgan Ferguson, PA-C; Greggory Keen, LPN; # Subject: RE: single balloon, and  office follow up      Chart reviewed. Seems reasonable to attempt single balloon enteroscopy.   Mickel Baas, can you please start looking for a 90-minute spot at [facility] for single balloon for this patient with me while [name] arranges for outpatient OV. Thanks.   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 Being discharged today, she had Enteroscopy yesterday with APC to duodenal and to jejunal AVMs  Capsule today with finding of 2 good-sized AVMs in the jejunum, beyond reach of  enteroscopy yesterday  Would like to get her set up with Dr. Bryan Lemma for single balloon enteroscopy as an outpatient, she would have to come off of Plavix pre procedure  , If you could get her an office follow-up with me within the next couple of weeks that would be great I am assuming Dr. Bryan Lemma is booked out for office appointments several weeks-I can see, and hopefully at that time would know when Dr. Bryan Lemma could do a single balloon  Please call patient Monday with the appointment - thanks

## 2022-06-14 NOTE — Telephone Encounter (Signed)
[facility] Pre-operative Risk Assessment     Request for surgical clearance:     Endoscopy Procedure  What type of surgery is being performed?     Balloon enteroscopy  When is this surgery scheduled?     06/21/22  What type of clearance is required ?   Pharmacy and Medical  Are there any medications that need to be held prior to surgery and how long? Eliquis to be held 2 days prior to the procedure  Practice name and name of physician performing surgery?      [facility]  What is your office phone and fax number?      Phone- [phone]  [phone]  Anesthesia type (None, local, MAC, general) ?       MAC

## 2022-06-14 NOTE — Telephone Encounter (Signed)
Transition Care Management Unsuccessful Follow-up Telephone Call  Date of discharge and from where:  [facility] 06/09/2022  Attempts:  3rd Attempt  Reason for unsuccessful TCM follow-up call:  Left voice message Juanda Crumble, [place] Direct Dial [phone]

## 2022-06-14 NOTE — Telephone Encounter (Signed)
Single balloon enteroscopy scheduled for 06/21/22 at 10:15 am. Instructions sent to pt's mychart. Ambulatory referral placed.

## 2022-06-14 NOTE — Telephone Encounter (Signed)
Patient with diagnosis of afib on Eliquis for anticoagulation.    Procedure: Balloon enteroscopy  Date of procedure: 06/21/22   CHA2DS2-VASc Score = 5   This indicates a 7.2% annual risk of stroke. The patient's score is based upon: CHF History: 1 HTN History: 1 Diabetes History: 1 Stroke History: 0 Vascular Disease History: 0 Age Score: 1 Gender Score: 1      Patient is an ESRD patient on HD  Per office protocol, patient can hold Eliquis for 2 days prior to procedure.    **This guidance is not considered finalized until pre-operative APP has relayed final recommendations.**

## 2022-06-15 ENCOUNTER — Encounter (HOSPITAL_COMMUNITY): Payer: Self-pay | Admitting: Gastroenterology

## 2022-06-15 ENCOUNTER — Other Ambulatory Visit (INDEPENDENT_AMBULATORY_CARE_PROVIDER_SITE_OTHER): Payer: 59

## 2022-06-15 DIAGNOSIS — D649 Anemia, unspecified: Secondary | ICD-10-CM | POA: Diagnosis not present

## 2022-06-15 LAB — CBC WITH DIFFERENTIAL/PLATELET
Basophils Absolute: 0.1 10*3/uL (ref 0.0–0.1)
Basophils Relative: 0.9 % (ref 0.0–3.0)
Eosinophils Absolute: 0.3 10*3/uL (ref 0.0–0.7)
Eosinophils Relative: 3.5 % (ref 0.0–5.0)
HCT: 30.9 % — ABNORMAL LOW (ref 36.0–46.0)
Hemoglobin: 10 g/dL — ABNORMAL LOW (ref 12.0–15.0)
Lymphocytes Relative: 18.6 % (ref 12.0–46.0)
Lymphs Abs: 1.4 10*3/uL (ref 0.7–4.0)
MCHC: 32.3 g/dL (ref 30.0–36.0)
MCV: 83.2 fl (ref 78.0–100.0)
Monocytes Absolute: 0.4 10*3/uL (ref 0.1–1.0)
Monocytes Relative: 5.7 % (ref 3.0–12.0)
Neutro Abs: 5.4 10*3/uL (ref 1.4–7.7)
Neutrophils Relative %: 71.3 % (ref 43.0–77.0)
Platelets: 207 10*3/uL (ref 150.0–400.0)
RBC: 3.71 Mil/uL — ABNORMAL LOW (ref 3.87–5.11)
RDW: 20 % — ABNORMAL HIGH (ref 11.5–15.5)
WBC: 7.6 10*3/uL (ref 4.0–10.5)

## 2022-06-15 NOTE — Telephone Encounter (Signed)
Eliquis to be held 2 days prior. Written instruction added to the prep instructions to be picked up by patient.

## 2022-06-15 NOTE — Telephone Encounter (Signed)
Called the patient's phone. Voicemail. Left a message asking the patient to call me back or have her daughter call me back. Printed instructions are at the 2nd floor front desk. The patient needs to come in for labs today or tomorrow.  She will need to plan on stopping her Eliquis 06/19/22 to 06/21/22. On 06/21/22 she will be told when to take it again. This has been hand written on her printed instructions for her procedure on 06/21/22. I contacted the dialysis center yesterday and the nurse will arrange to have the Wednesday dialysis day move to Tuesday or Thursday.

## 2022-06-16 ENCOUNTER — Encounter: Payer: Self-pay | Admitting: *Deleted

## 2022-06-16 ENCOUNTER — Encounter: Payer: Self-pay | Admitting: Cardiology

## 2022-06-16 DIAGNOSIS — N2581 Secondary hyperparathyroidism of renal origin: Secondary | ICD-10-CM | POA: Diagnosis not present

## 2022-06-16 DIAGNOSIS — N186 End stage renal disease: Secondary | ICD-10-CM | POA: Diagnosis not present

## 2022-06-16 DIAGNOSIS — L299 Pruritus, unspecified: Secondary | ICD-10-CM | POA: Diagnosis not present

## 2022-06-16 DIAGNOSIS — Z992 Dependence on renal dialysis: Secondary | ICD-10-CM | POA: Diagnosis not present

## 2022-06-16 DIAGNOSIS — D509 Iron deficiency anemia, unspecified: Secondary | ICD-10-CM | POA: Diagnosis not present

## 2022-06-16 DIAGNOSIS — D631 Anemia in chronic kidney disease: Secondary | ICD-10-CM | POA: Diagnosis not present

## 2022-06-16 DIAGNOSIS — E119 Type 2 diabetes mellitus without complications: Secondary | ICD-10-CM | POA: Diagnosis not present

## 2022-06-16 NOTE — Telephone Encounter (Signed)
Patient picked up her instructions. CBC drawn.

## 2022-06-19 DIAGNOSIS — N186 End stage renal disease: Secondary | ICD-10-CM | POA: Diagnosis not present

## 2022-06-19 DIAGNOSIS — D631 Anemia in chronic kidney disease: Secondary | ICD-10-CM | POA: Diagnosis not present

## 2022-06-19 DIAGNOSIS — D509 Iron deficiency anemia, unspecified: Secondary | ICD-10-CM | POA: Diagnosis not present

## 2022-06-19 DIAGNOSIS — L299 Pruritus, unspecified: Secondary | ICD-10-CM | POA: Diagnosis not present

## 2022-06-19 DIAGNOSIS — N2581 Secondary hyperparathyroidism of renal origin: Secondary | ICD-10-CM | POA: Diagnosis not present

## 2022-06-19 DIAGNOSIS — Z992 Dependence on renal dialysis: Secondary | ICD-10-CM | POA: Diagnosis not present

## 2022-06-19 DIAGNOSIS — E119 Type 2 diabetes mellitus without complications: Secondary | ICD-10-CM | POA: Diagnosis not present

## 2022-06-20 DIAGNOSIS — Z992 Dependence on renal dialysis: Secondary | ICD-10-CM | POA: Diagnosis not present

## 2022-06-20 DIAGNOSIS — L299 Pruritus, unspecified: Secondary | ICD-10-CM | POA: Diagnosis not present

## 2022-06-20 DIAGNOSIS — D509 Iron deficiency anemia, unspecified: Secondary | ICD-10-CM | POA: Diagnosis not present

## 2022-06-20 DIAGNOSIS — D631 Anemia in chronic kidney disease: Secondary | ICD-10-CM | POA: Diagnosis not present

## 2022-06-20 DIAGNOSIS — N2581 Secondary hyperparathyroidism of renal origin: Secondary | ICD-10-CM | POA: Diagnosis not present

## 2022-06-20 DIAGNOSIS — N186 End stage renal disease: Secondary | ICD-10-CM | POA: Diagnosis not present

## 2022-06-20 DIAGNOSIS — E119 Type 2 diabetes mellitus without complications: Secondary | ICD-10-CM | POA: Diagnosis not present

## 2022-06-21 ENCOUNTER — Ambulatory Visit (HOSPITAL_COMMUNITY): Payer: 59 | Admitting: Certified Registered Nurse Anesthetist

## 2022-06-21 ENCOUNTER — Encounter (HOSPITAL_COMMUNITY): Payer: Self-pay | Admitting: Gastroenterology

## 2022-06-21 ENCOUNTER — Ambulatory Visit (HOSPITAL_BASED_OUTPATIENT_CLINIC_OR_DEPARTMENT_OTHER): Payer: 59 | Admitting: Certified Registered Nurse Anesthetist

## 2022-06-21 ENCOUNTER — Encounter (HOSPITAL_COMMUNITY)
Admission: RE | Disposition: A | Discharge status: Home / Self Care | Payer: Self-pay | Source: Home / Self Care | Attending: Gastroenterology

## 2022-06-21 ENCOUNTER — Ambulatory Visit (HOSPITAL_COMMUNITY)
Admission: RE | Admit: 2022-06-21 | Discharge: 2022-06-21 | Disposition: A | Discharge status: Home / Self Care | Payer: 59 | Attending: Gastroenterology | Admitting: Gastroenterology

## 2022-06-21 DIAGNOSIS — I272 Pulmonary hypertension, unspecified: Secondary | ICD-10-CM

## 2022-06-21 DIAGNOSIS — I132 Hypertensive heart and chronic kidney disease with heart failure and with stage 5 chronic kidney disease, or end stage renal disease: Secondary | ICD-10-CM | POA: Insufficient documentation

## 2022-06-21 DIAGNOSIS — K921 Melena: Secondary | ICD-10-CM | POA: Diagnosis not present

## 2022-06-21 DIAGNOSIS — K289 Gastrojejunal ulcer, unspecified as acute or chronic, without hemorrhage or perforation: Secondary | ICD-10-CM

## 2022-06-21 DIAGNOSIS — E1122 Type 2 diabetes mellitus with diabetic chronic kidney disease: Secondary | ICD-10-CM | POA: Insufficient documentation

## 2022-06-21 DIAGNOSIS — I5032 Chronic diastolic (congestive) heart failure: Secondary | ICD-10-CM | POA: Insufficient documentation

## 2022-06-21 DIAGNOSIS — I083 Combined rheumatic disorders of mitral, aortic and tricuspid valves: Secondary | ICD-10-CM | POA: Insufficient documentation

## 2022-06-21 DIAGNOSIS — Z7901 Long term (current) use of anticoagulants: Secondary | ICD-10-CM | POA: Diagnosis not present

## 2022-06-21 DIAGNOSIS — J449 Chronic obstructive pulmonary disease, unspecified: Secondary | ICD-10-CM | POA: Diagnosis not present

## 2022-06-21 DIAGNOSIS — R933 Abnormal findings on diagnostic imaging of other parts of digestive tract: Secondary | ICD-10-CM | POA: Diagnosis not present

## 2022-06-21 DIAGNOSIS — I4819 Other persistent atrial fibrillation: Secondary | ICD-10-CM | POA: Insufficient documentation

## 2022-06-21 DIAGNOSIS — R5383 Other fatigue: Secondary | ICD-10-CM | POA: Insufficient documentation

## 2022-06-21 DIAGNOSIS — I080 Rheumatic disorders of both mitral and aortic valves: Secondary | ICD-10-CM | POA: Diagnosis not present

## 2022-06-21 DIAGNOSIS — K31819 Angiodysplasia of stomach and duodenum without bleeding: Secondary | ICD-10-CM | POA: Diagnosis not present

## 2022-06-21 DIAGNOSIS — N186 End stage renal disease: Secondary | ICD-10-CM | POA: Insufficient documentation

## 2022-06-21 DIAGNOSIS — D631 Anemia in chronic kidney disease: Secondary | ICD-10-CM | POA: Diagnosis not present

## 2022-06-21 DIAGNOSIS — Z95810 Presence of automatic (implantable) cardiac defibrillator: Secondary | ICD-10-CM | POA: Insufficient documentation

## 2022-06-21 DIAGNOSIS — D649 Anemia, unspecified: Secondary | ICD-10-CM | POA: Insufficient documentation

## 2022-06-21 DIAGNOSIS — K31811 Angiodysplasia of stomach and duodenum with bleeding: Secondary | ICD-10-CM | POA: Diagnosis not present

## 2022-06-21 DIAGNOSIS — K7460 Unspecified cirrhosis of liver: Secondary | ICD-10-CM | POA: Insufficient documentation

## 2022-06-21 DIAGNOSIS — Z992 Dependence on renal dialysis: Secondary | ICD-10-CM | POA: Diagnosis not present

## 2022-06-21 DIAGNOSIS — K5520 Angiodysplasia of colon without hemorrhage: Secondary | ICD-10-CM

## 2022-06-21 DIAGNOSIS — I509 Heart failure, unspecified: Secondary | ICD-10-CM | POA: Diagnosis not present

## 2022-06-21 DIAGNOSIS — K284 Chronic or unspecified gastrojejunal ulcer with hemorrhage: Secondary | ICD-10-CM | POA: Diagnosis not present

## 2022-06-21 HISTORY — PX: BALLOON ENTEROSCOPY: SHX6863

## 2022-06-21 HISTORY — PX: SUBMUCOSAL TATTOO INJECTION: SHX6856

## 2022-06-21 HISTORY — PX: HEMOSTASIS CLIP PLACEMENT: SHX6857

## 2022-06-21 HISTORY — PX: HOT HEMOSTASIS: SHX5433

## 2022-06-21 LAB — POCT I-STAT, CHEM 8
BUN: 16 mg/dL (ref 8–23)
Calcium, Ion: 0.94 mmol/L — ABNORMAL LOW (ref 1.15–1.40)
Chloride: 95 mmol/L — ABNORMAL LOW (ref 98–111)
Creatinine, Ser: 5.4 mg/dL — ABNORMAL HIGH (ref 0.44–1.00)
Glucose, Bld: 89 mg/dL (ref 70–99)
HCT: 41 % (ref 36.0–46.0)
Hemoglobin: 13.9 g/dL (ref 12.0–15.0)
Potassium: 5 mmol/L (ref 3.5–5.1)
Sodium: 139 mmol/L (ref 135–145)
TCO2: 36 mmol/L — ABNORMAL HIGH (ref 22–32)

## 2022-06-21 SURGERY — ENTEROSCOPY, USING BALLOON
Anesthesia: General

## 2022-06-21 MED ORDER — LIDOCAINE HCL (CARDIAC) PF 100 MG/5ML IV SOSY
PREFILLED_SYRINGE | INTRAVENOUS | Status: DC | PRN
  Administered 2022-06-21: 60 mg via INTRAVENOUS

## 2022-06-21 MED ORDER — SODIUM CHLORIDE 0.9 % IV SOLN: INTRAVENOUS | Status: DC

## 2022-06-21 MED ORDER — PHENYLEPHRINE HCL-NACL 20-0.9 MG/250ML-% IV SOLN
INTRAVENOUS | Status: DC | PRN
  Administered 2022-06-21: 50 ug/min via INTRAVENOUS

## 2022-06-21 MED ORDER — ROCURONIUM BROMIDE 100 MG/10ML IV SOLN
INTRAVENOUS | Status: DC | PRN
  Administered 2022-06-21: 30 mg via INTRAVENOUS

## 2022-06-21 MED ORDER — LACTATED RINGERS IV SOLN: INTRAVENOUS | Status: DC | PRN

## 2022-06-21 MED ORDER — SUGAMMADEX SODIUM 200 MG/2ML IV SOLN
INTRAVENOUS | Status: DC | PRN
  Administered 2022-06-21: 200 mg via INTRAVENOUS

## 2022-06-21 MED ORDER — PHENYLEPHRINE HCL (PRESSORS) 10 MG/ML IV SOLN
INTRAVENOUS | Status: DC | PRN
  Administered 2022-06-21: 80 ug via INTRAVENOUS
  Administered 2022-06-21 (×2): 160 ug via INTRAVENOUS
  Administered 2022-06-21: 80 ug via INTRAVENOUS

## 2022-06-21 MED ORDER — PROPOFOL 10 MG/ML IV BOLUS
INTRAVENOUS | Status: DC | PRN
  Administered 2022-06-21: 10 mg via INTRAVENOUS
  Administered 2022-06-21: 70 mg via INTRAVENOUS

## 2022-06-21 MED ORDER — SPOT INK MARKER SYRINGE KIT
PACK | SUBMUCOSAL | Status: DC | PRN
  Administered 2022-06-21: 1 mL via SUBMUCOSAL

## 2022-06-21 NOTE — Anesthesia Preprocedure Evaluation (Addendum)
Anesthesia Evaluation  Patient identified by MRN, date of birth, ID band Patient awake    Reviewed: Allergy & Precautions, NPO status , Patient's Chart, lab work & pertinent test results  Airway Mallampati: III  TM Distance: >3 FB Neck ROM: Limited    Dental  (+) Dental Advisory Given, Missing   Pulmonary asthma , sleep apnea , COPD   Pulmonary exam normal breath sounds clear to auscultation       Cardiovascular hypertension, pulmonary hypertension+CHF and + DOE  + dysrhythmias + pacemaker + Cardiac Defibrillator + Valvular Problems/Murmurs (Mod to severe TR) MR and AI  Rhythm:Regular Rate:Normal + Systolic murmurs Echo 10/107  1. Left ventricular ejection fraction, by estimation, is 50 to 55%. The left ventricle has low normal function. The left ventricle has no regional wall motion abnormalities. There is moderate concentric left ventricular hypertrophy. Left ventricular diastolic parameters are indeterminate.   2. Right ventricular systolic function is normal. The right ventricular size is moderately enlarged. There is normal pulmonary artery systolic pressure.   3. Left atrial size was severely dilated.   4. The mitral valve is normal in structure. Mild mitral valve regurgitation. No evidence of mitral stenosis.   5. Tricuspid valve regurgitation is moderate to severe.   6. The aortic valve is tricuspid. Aortic valve regurgitation is moderate. No aortic stenosis is present. Aortic regurgitation PHT measures 298 msec.   7. The inferior vena cava is dilated in size with >50% respiratory variability, suggesting right atrial pressure of 8 mmHg.   AICD (Medronic Viva XT) Brady Statistic AP VP Percent:  0.49 % Brady Statistic AP VS Percent:  0.02 % Brady Statistic AS VP Percent:  99.32 % Brady Statistic AS VS Percent:  0.17 % Brady Statistic RA Percent Paced:  0.52 % Brady Statistic RV Percent Paced:  99.5 %      Cath  06/2020 1. Elevated filling pressures, RV failure out of proportion to left-sided failure.  2. PAPI 1.7 suggesting RV dysfunction.  3. Preserved cardiac output.  4. Moderate pulmonary venous hypertension.    Will admit patient for close monitoring of creatinine with IV diuresis.      Neuro/Psych  PSYCHIATRIC DISORDERS  Depression     Neuromuscular disease    GI/Hepatic negative GI ROS,,,(+) Cirrhosis       , Hepatitis -  Endo/Other  diabetesHypothyroidism    Renal/GU ESRF and DialysisRenal disease     Musculoskeletal  (+) Arthritis ,    Abdominal   Peds  Hematology  (+) Blood dyscrasia (Eliquis), anemia   Anesthesia Other Findings anemia, bloody and black stools, FOBT +, stable cirrhosis  Reproductive/Obstetrics                             Anesthesia Physical Anesthesia Plan  ASA: 4  Anesthesia Plan: General   Post-op Pain Management: Minimal or no pain anticipated   Induction: Intravenous  PONV Risk Score and Plan: 2 and Treatment may vary due to age or medical condition, Ondansetron and Dexamethasone  Airway Management Planned: Oral ETT and Video Laryngoscope Planned  Additional Equipment:   Intra-op Plan:   Post-operative Plan: Extubation in OR  Informed Consent: I have reviewed the patients History and Physical, chart, labs and discussed the procedure including the risks, benefits and alternatives for the proposed anesthesia with the patient or authorized representative who has indicated his/her understanding and acceptance.     Dental advisory given  Plan Discussed with:  CRNA  Anesthesia Plan Comments:        Anesthesia Quick Evaluation

## 2022-06-21 NOTE — Interval H&P Note (Signed)
History and Physical Interval Note:  77 year old female with multiple medical problems to include ESRD on HD, atrial fibrillation (on Eliquis), HCV cirrhosis, HTN, CHF with biventricular ICD, COPD, diverticulosis/diverticulitis with prior sigmoid resection, presents for single balloon enteroscopy for diagnostic and therapeutic intent of known small bowel AVMs.  She has a history of recurrent bleeding from small bowel (and previously gastric) AVMs, including most recent admission 1/31 - 2/2.  Push enteroscopy on that admission with 2 AVMs in the duodenum and 2 in the jejunum, all treated with APC.  VCE on that admission with medium to large AVMs x 2 in the jejunum, with the second being distal to the remote small bowel tattoo that was previously placed in 2021.  Discharge hemoglobin was 9.6.  Repeat on 06/15/2022 was 10.  She has been holding her Eliquis x 2 days for procedure today.  Given significant comorbidities and anticipated length of procedure, plan for general anesthesia with elective intubation.  06/21/2022 9:39 AM  Morgan Clay  has presented today for surgery, with the diagnosis of AVMs.  The various methods of treatment have been discussed with the patient and family. After consideration of risks, benefits and other options for treatment, the patient has consented to  Procedure(s): BALLOON ENTEROSCOPY (N/A) ESOPHAGOGASTRODUODENOSCOPY (EGD) (N/A) as a surgical intervention.  The patient's history has been reviewed, patient examined, no change in status, stable for surgery.  I have reviewed the patient's chart and labs.  Questions were answered to the patient's satisfaction.     Morgan Clay

## 2022-06-21 NOTE — Anesthesia Procedure Notes (Signed)
Procedure Name: Intubation Date/Time: 06/21/2022 10:05 AM  Performed by: [place] Manus Rudd, CRNAPre-anesthesia Checklist: Patient identified, Emergency Drugs available, Suction available and Patient being monitored Patient Re-evaluated:Patient Re-evaluated prior to induction Oxygen Delivery Method: Circle system utilized Preoxygenation: Pre-oxygenation with 100% oxygen Induction Type: IV induction Ventilation: Mask ventilation without difficulty Laryngoscope Size: Mac and 3 Grade View: Grade I Tube type: Oral Tube size: 7.0 mm Number of attempts: 1 Airway Equipment and Method: Stylet and Oral airway Placement Confirmation: ETT inserted through vocal cords under direct vision, positive ETCO2 and breath sounds checked- equal and bilateral Secured at: 20 cm Tube secured with: Tape Dental Injury: Teeth and Oropharynx as per pre-operative assessment

## 2022-06-21 NOTE — Transfer of Care (Signed)
Immediate Anesthesia Transfer of Care Note  Patient: Morgan Clay  Procedure(s) Performed: BALLOON ENTEROSCOPY ESOPHAGOGASTRODUODENOSCOPY (EGD) SUBMUCOSAL TATTOO INJECTION HOT HEMOSTASIS (ARGON PLASMA COAGULATION/BICAP) HEMOSTASIS CLIP PLACEMENT  Patient Location: PACU  Anesthesia Type:General  Level of Consciousness: awake, alert , and oriented  Airway & Oxygen Therapy: Patient Spontanous Breathing and Patient connected to face mask oxygen  Post-op Assessment: Report given to RN and Post -op Vital signs reviewed and stable  Post vital signs: Reviewed and stable  Last Vitals:  Vitals Value Taken Time  BP    Temp    Pulse 72 06/21/22 1121  Resp 13 06/21/22 1121  SpO2 96 % 06/21/22 1121  Vitals shown include unvalidated device data.  Last Pain:  Vitals:   06/21/22 0917  TempSrc: Temporal  PainSc: 0-No pain         Complications: No notable events documented.

## 2022-06-21 NOTE — Anesthesia Postprocedure Evaluation (Signed)
Anesthesia Post Note  Patient: Morgan Clay  Procedure(s) Performed: BALLOON ENTEROSCOPY ESOPHAGOGASTRODUODENOSCOPY (EGD) SUBMUCOSAL TATTOO INJECTION HOT HEMOSTASIS (ARGON PLASMA COAGULATION/BICAP) HEMOSTASIS CLIP PLACEMENT     Patient location during evaluation: PACU Anesthesia Type: General Level of consciousness: sedated and patient cooperative Pain management: pain level controlled Vital Signs Assessment: post-procedure vital signs reviewed and stable Respiratory status: spontaneous breathing Cardiovascular status: stable Anesthetic complications: no   No notable events documented.  Last Vitals:  Vitals:   06/21/22 1120 06/21/22 1130  BP: (!) 156/61 (!) 156/56  Pulse: 70 67  Resp: 12 13  Temp: 37 C   SpO2: 100% 96%    Last Pain:  Vitals:   06/21/22 1130  TempSrc:   PainSc: 0-No pain                 Nolon Nations

## 2022-06-21 NOTE — Op Note (Signed)
[facility] Patient Name: Morgan Clay Procedure Date: 06/21/2022 MRN: [id number] Attending MD: Gerrit Heck , MD, [id number] Date of Birth: Mar 31, 1946 CSN: [id number] Age: 77 Admit Type: Outpatient Procedure:                Device assistted small bowel enteroscopy (single                            balloon enteroscopy) Indications:              Abnormal video capsule endoscopy, Recurrent                            bleeding in the small bowel, Arteriovenous                            malformation in the small intestine Providers:                Gerrit Heck, MD, Doristine Johns, RN,                            William Dalton, Technician Referring MD:              Medicines:                Monitored Anesthesia Care Complications:            No immediate complications. Estimated Blood Loss:     Estimated blood loss was minimal. Procedure:                Pre-Anesthesia Assessment:                           - Prior to the procedure, a History and Physical                            was performed, and patient medications and                            allergies were reviewed. The patient's tolerance of                            previous anesthesia was also reviewed. The risks                            and benefits of the procedure and the sedation                            options and risks were discussed with the patient.                            All questions were answered, and informed consent                            was obtained. Prior Anticoagulants: The patient has                            taken Eliquis (  apixaban), last dose was 2 days                            prior to procedure. ASA Grade Assessment: IV - A                            patient with severe systemic disease that is a                            constant threat to life. After reviewing the risks                            and benefits, the patient was deemed in                             satisfactory condition to undergo the procedure.                           After obtaining informed consent, the endoscope was                            passed under direct vision. Throughout the                            procedure, the patient's blood pressure, pulse, and                            oxygen saturations were monitored continuously. The                            [id number]-Q180 [id number] Olympus enteroscope was                            introduced through the mouth and advanced to the                            mid-jejunum. The enteroscope was advanced deep into                            the small bowel. When no further advancement was                            possible, the overtube was advanced into place, and                            through a series of balloon inflation/deflation and                            enteroscope advancement/retraction, the endoscope                            was further advanced deep into the small bowel.  Able to advance the enteroscope approximately 125                            cm distal to the pylorus into the mid jejunum. The                            small bowel enteroscopy was accomplished without                            difficulty. The patient tolerated the procedure                            well. Scope In: Scope Out: Findings:      The esophagus was normal.      The stomach was normal.      There was no evidence of significant pathology in the duodenal bulb, in       the first portion of the duodenum, in the second portion of the duodenum       and in the third portion of the duodenum.      Two angioectasias with no bleeding were found in the fourth portion of       the duodenum. Coagulation for hemostasis using argon plasma was       successful. Estimated blood loss: none.      A tattoo was seen in the proximal jejunum. The tattoo site appeared       normal. This was located approximately 80  cm from the pylorus.      One non-bleeding superficial jejunal ulcer with was found in the       proximal jejunum. The lesion was 3 mm in largest dimension. This was       located approximately 10 cm distal to the previous tattoo site.       Coagulation for hemostasis using argon plasma was successful. For       additional hemostasis, one hemostatic clip was successfully placed (MR       conditional). Clip manufacturer: [employer].      There was no evidence of significant pathology in the remainder of the       jejunum. Advanced the enteroscope into the mid-jejunum, with extent       reached being approximately 125 cm distal to the pylorus. Area was       tattooed with an injection of 2 mL of Spot (carbon black). Impression:               - Normal esophagus.                           - Normal stomach.                           - Normal duodenal bulb, first portion of the                            duodenum, second portion of the duodenum and third                            portion of the duodenum.                           -  Two non-bleeding angioectasias in the duodenum.                            Treated with argon plasma coagulation (APC).                           - A tattoo was seen in the jejunum. The tattoo site                            appeared normal.                           - Non-bleeding jejunal ulcer. Treated with argon                            plasma coagulation (APC). Clip (MR conditional) was                            placed. Clip manufacturer: [employer].                           - The examined portion of the jejunum was normal.                            Tattooed.                           - No specimens collected. Recommendation:           - Discharge patient to home (with escort).                           - Advance diet as tolerated.                           - Resume Eliquis (apixaban) at prior dose tomorrow.                           - Repeat  the small bowel enteroscopy PRN for                            retreatment.                           - Continue serial CBC checks with hemodialysis. Procedure Code(s):        --- Professional ---                           [phone], Small intestinal endoscopy, enteroscopy                            beyond second portion of duodenum, not including                            ileum; with control of bleeding (eg, injection,  bipolar cautery, unipolar cautery, laser, heater                            probe, stapler, plasma coagulator)                           44799, Unlisted procedure, small intestine Diagnosis Code(s):        --- Professional ---                           K31.819, Angiodysplasia of stomach and duodenum                            without bleeding                           K28.9, Gastrojejunal ulcer, unspecified as acute or                            chronic, without hemorrhage or perforation                           K92.2, Gastrointestinal hemorrhage, unspecified                           R93.3, Abnormal findings on diagnostic imaging of                            other parts of digestive tract CPT copyright 2022 [employer]. All rights reserved. The codes documented in this report are preliminary and upon coder review may  be revised to meet current compliance requirements. Gerrit Heck, MD 06/21/2022 11:23:08 AM Number of Addenda: 0

## 2022-06-21 NOTE — Discharge Instructions (Signed)
YOU HAD AN ENDOSCOPIC PROCEDURE TODAY: Refer to the procedure report and other information in the discharge instructions given to you for any specific questions about what was found during the examination. If this information does not answer your questions, please call [place] office at [phone] to clarify.  ° °YOU SHOULD EXPECT: Some feelings of bloating in the abdomen. Passage of more gas than usual. Walking can help get rid of the air that was put into your GI tract during the procedure and reduce the bloating. If you had a lower endoscopy (such as a colonoscopy or flexible sigmoidoscopy) you may notice spotting of blood in your stool or on the toilet paper. Some abdominal soreness may be present for a day or two, also. ° °DIET: Your first meal following the procedure should be a light meal and then it is ok to progress to your normal diet. A half-sandwich or bowl of soup is an example of a good first meal. Heavy or fried foods are harder to digest and may make you feel nauseous or bloated. Drink plenty of fluids but you should avoid alcoholic beverages for 24 hours. If you had a esophageal dilation, please see attached instructions for diet.   ° °ACTIVITY: Your care partner should take you home directly after the procedure. You should plan to take it easy, moving slowly for the rest of the day. You can resume normal activity the day after the procedure however YOU SHOULD NOT DRIVE, use power tools, machinery or perform tasks that involve climbing or major physical exertion for 24 hours (because of the sedation medicines used during the test).  ° °SYMPTOMS TO REPORT IMMEDIATELY: °A gastroenterologist can be reached at any hour. Please call [phone]  for any of the following symptoms:  °Following lower endoscopy (colonoscopy, flexible sigmoidoscopy) °Excessive amounts of blood in the stool  °Significant tenderness, worsening of abdominal pains  °Swelling of the abdomen that is new, acute  °Fever of 100° or  higher  °Following upper endoscopy (EGD, EUS, ERCP, esophageal dilation) °Vomiting of blood or coffee ground material  °New, significant abdominal pain  °New, significant chest pain or pain under the shoulder blades  °Painful or persistently difficult swallowing  °New shortness of breath  °Black, tarry-looking or red, bloody stools ° °FOLLOW UP:  °If any biopsies were taken you will be contacted by phone or by letter within the next 1-3 weeks. Call [phone]  if you have not heard about the biopsies in 3 weeks.  °Please also call with any specific questions about appointments or follow up tests. ° °

## 2022-06-23 ENCOUNTER — Ambulatory Visit: Payer: 59 | Admitting: Physician Assistant

## 2022-06-23 DIAGNOSIS — N186 End stage renal disease: Secondary | ICD-10-CM | POA: Diagnosis not present

## 2022-06-23 DIAGNOSIS — E119 Type 2 diabetes mellitus without complications: Secondary | ICD-10-CM | POA: Diagnosis not present

## 2022-06-23 DIAGNOSIS — D509 Iron deficiency anemia, unspecified: Secondary | ICD-10-CM | POA: Diagnosis not present

## 2022-06-23 DIAGNOSIS — L299 Pruritus, unspecified: Secondary | ICD-10-CM | POA: Diagnosis not present

## 2022-06-23 DIAGNOSIS — D631 Anemia in chronic kidney disease: Secondary | ICD-10-CM | POA: Diagnosis not present

## 2022-06-23 DIAGNOSIS — Z992 Dependence on renal dialysis: Secondary | ICD-10-CM | POA: Diagnosis not present

## 2022-06-23 DIAGNOSIS — N2581 Secondary hyperparathyroidism of renal origin: Secondary | ICD-10-CM | POA: Diagnosis not present

## 2022-06-24 ENCOUNTER — Encounter (HOSPITAL_COMMUNITY): Payer: Self-pay | Admitting: Gastroenterology

## 2022-06-26 ENCOUNTER — Telehealth: Payer: Self-pay | Admitting: Physician Assistant

## 2022-06-26 DIAGNOSIS — E119 Type 2 diabetes mellitus without complications: Secondary | ICD-10-CM | POA: Diagnosis not present

## 2022-06-26 DIAGNOSIS — N2581 Secondary hyperparathyroidism of renal origin: Secondary | ICD-10-CM | POA: Diagnosis not present

## 2022-06-26 DIAGNOSIS — L299 Pruritus, unspecified: Secondary | ICD-10-CM | POA: Diagnosis not present

## 2022-06-26 DIAGNOSIS — Z992 Dependence on renal dialysis: Secondary | ICD-10-CM | POA: Diagnosis not present

## 2022-06-26 DIAGNOSIS — D631 Anemia in chronic kidney disease: Secondary | ICD-10-CM | POA: Diagnosis not present

## 2022-06-26 DIAGNOSIS — D509 Iron deficiency anemia, unspecified: Secondary | ICD-10-CM | POA: Diagnosis not present

## 2022-06-26 DIAGNOSIS — N186 End stage renal disease: Secondary | ICD-10-CM | POA: Diagnosis not present

## 2022-06-26 NOTE — Telephone Encounter (Signed)
Patient called states she had a procedure a few weeks ago and is now having excruciating abdomina pain seeking advise. Also said she is currently at Dialysis and her BP is high.

## 2022-06-26 NOTE — Telephone Encounter (Signed)
Left message for pt to call back  °

## 2022-06-27 NOTE — Telephone Encounter (Signed)
My chart message sent to pt.

## 2022-06-27 NOTE — Telephone Encounter (Signed)
Left message for pt to call back  °

## 2022-06-27 NOTE — Telephone Encounter (Signed)
Spoke with pt. Pt states abdominal pain started after procedure on 2/14. Pt reports she has had lower abdominal pain, sour belches, and diarrhea since procedure. Pt took pepto bismol yesterday but had to leave dialysis early due to abd pain. Pt denies any blood in stools.

## 2022-06-28 ENCOUNTER — Other Ambulatory Visit: Payer: Self-pay

## 2022-06-28 DIAGNOSIS — D509 Iron deficiency anemia, unspecified: Secondary | ICD-10-CM | POA: Diagnosis not present

## 2022-06-28 DIAGNOSIS — D631 Anemia in chronic kidney disease: Secondary | ICD-10-CM | POA: Diagnosis not present

## 2022-06-28 DIAGNOSIS — L299 Pruritus, unspecified: Secondary | ICD-10-CM | POA: Diagnosis not present

## 2022-06-28 DIAGNOSIS — N2581 Secondary hyperparathyroidism of renal origin: Secondary | ICD-10-CM | POA: Diagnosis not present

## 2022-06-28 DIAGNOSIS — E119 Type 2 diabetes mellitus without complications: Secondary | ICD-10-CM | POA: Diagnosis not present

## 2022-06-28 DIAGNOSIS — R110 Nausea: Secondary | ICD-10-CM

## 2022-06-28 DIAGNOSIS — N186 End stage renal disease: Secondary | ICD-10-CM | POA: Diagnosis not present

## 2022-06-28 DIAGNOSIS — Z992 Dependence on renal dialysis: Secondary | ICD-10-CM | POA: Diagnosis not present

## 2022-06-28 DIAGNOSIS — R197 Diarrhea, unspecified: Secondary | ICD-10-CM

## 2022-06-28 DIAGNOSIS — R1030 Lower abdominal pain, unspecified: Secondary | ICD-10-CM

## 2022-06-28 NOTE — Telephone Encounter (Addendum)
Spoke with pt and gave pt recommendations. Pt verbalized understanding. CT scan scheduled for 07/05/22 at 2 pm. Called pt to let her know. Left voicemail for pt to call back.

## 2022-06-29 NOTE — Telephone Encounter (Signed)
Left message for pt to call back  °

## 2022-06-29 NOTE — Progress Notes (Signed)
Remote ICD transmission.   

## 2022-06-29 NOTE — Addendum Note (Signed)
Addended by: Cheri Kearns A on: 06/29/2022 09:08 AM   Modules accepted: Level of Service

## 2022-06-30 DIAGNOSIS — N2581 Secondary hyperparathyroidism of renal origin: Secondary | ICD-10-CM | POA: Diagnosis not present

## 2022-06-30 DIAGNOSIS — D509 Iron deficiency anemia, unspecified: Secondary | ICD-10-CM | POA: Diagnosis not present

## 2022-06-30 DIAGNOSIS — D631 Anemia in chronic kidney disease: Secondary | ICD-10-CM | POA: Diagnosis not present

## 2022-06-30 DIAGNOSIS — L299 Pruritus, unspecified: Secondary | ICD-10-CM | POA: Diagnosis not present

## 2022-06-30 DIAGNOSIS — Z992 Dependence on renal dialysis: Secondary | ICD-10-CM | POA: Diagnosis not present

## 2022-06-30 DIAGNOSIS — N186 End stage renal disease: Secondary | ICD-10-CM | POA: Diagnosis not present

## 2022-06-30 DIAGNOSIS — E119 Type 2 diabetes mellitus without complications: Secondary | ICD-10-CM | POA: Diagnosis not present

## 2022-06-30 NOTE — Telephone Encounter (Signed)
Attempted to reach patient. Her phone goes straight to vm. Lm on vm for patient to return call.

## 2022-07-03 DIAGNOSIS — D631 Anemia in chronic kidney disease: Secondary | ICD-10-CM | POA: Diagnosis not present

## 2022-07-03 DIAGNOSIS — D509 Iron deficiency anemia, unspecified: Secondary | ICD-10-CM | POA: Diagnosis not present

## 2022-07-03 DIAGNOSIS — N186 End stage renal disease: Secondary | ICD-10-CM | POA: Diagnosis not present

## 2022-07-03 DIAGNOSIS — E119 Type 2 diabetes mellitus without complications: Secondary | ICD-10-CM | POA: Diagnosis not present

## 2022-07-03 DIAGNOSIS — L299 Pruritus, unspecified: Secondary | ICD-10-CM | POA: Diagnosis not present

## 2022-07-03 DIAGNOSIS — Z992 Dependence on renal dialysis: Secondary | ICD-10-CM | POA: Diagnosis not present

## 2022-07-03 DIAGNOSIS — N2581 Secondary hyperparathyroidism of renal origin: Secondary | ICD-10-CM | POA: Diagnosis not present

## 2022-07-03 NOTE — Telephone Encounter (Signed)
No return call received. Will await further communication from patient if she has ongoing concerns.

## 2022-07-05 ENCOUNTER — Ambulatory Visit (HOSPITAL_BASED_OUTPATIENT_CLINIC_OR_DEPARTMENT_OTHER): Payer: 59

## 2022-07-05 ENCOUNTER — Ambulatory Visit (HOSPITAL_BASED_OUTPATIENT_CLINIC_OR_DEPARTMENT_OTHER)
Admission: RE | Admit: 2022-07-05 | Discharge: 2022-07-05 | Disposition: A | Discharge status: Home / Self Care | Payer: 59 | Source: Ambulatory Visit | Attending: Gastroenterology | Admitting: Gastroenterology

## 2022-07-05 DIAGNOSIS — N2581 Secondary hyperparathyroidism of renal origin: Secondary | ICD-10-CM | POA: Diagnosis not present

## 2022-07-05 DIAGNOSIS — L299 Pruritus, unspecified: Secondary | ICD-10-CM | POA: Diagnosis not present

## 2022-07-05 DIAGNOSIS — D631 Anemia in chronic kidney disease: Secondary | ICD-10-CM | POA: Diagnosis not present

## 2022-07-05 DIAGNOSIS — E119 Type 2 diabetes mellitus without complications: Secondary | ICD-10-CM | POA: Diagnosis not present

## 2022-07-05 DIAGNOSIS — D509 Iron deficiency anemia, unspecified: Secondary | ICD-10-CM | POA: Diagnosis not present

## 2022-07-05 DIAGNOSIS — R1030 Lower abdominal pain, unspecified: Secondary | ICD-10-CM | POA: Insufficient documentation

## 2022-07-05 DIAGNOSIS — N261 Atrophy of kidney (terminal): Secondary | ICD-10-CM | POA: Diagnosis not present

## 2022-07-05 DIAGNOSIS — N186 End stage renal disease: Secondary | ICD-10-CM | POA: Diagnosis not present

## 2022-07-05 DIAGNOSIS — R109 Unspecified abdominal pain: Secondary | ICD-10-CM | POA: Diagnosis not present

## 2022-07-05 DIAGNOSIS — R110 Nausea: Secondary | ICD-10-CM | POA: Insufficient documentation

## 2022-07-05 DIAGNOSIS — R197 Diarrhea, unspecified: Secondary | ICD-10-CM | POA: Diagnosis not present

## 2022-07-05 DIAGNOSIS — Z992 Dependence on renal dialysis: Secondary | ICD-10-CM | POA: Diagnosis not present

## 2022-07-05 MED ORDER — IOHEXOL 300 MG/ML  SOLN
100.0000 mL | Freq: Once | INTRAMUSCULAR | Status: AC | PRN
  Administered 2022-07-05: 100 mL via INTRAVENOUS

## 2022-07-06 ENCOUNTER — Telehealth: Payer: Self-pay | Admitting: Gastroenterology

## 2022-07-06 DIAGNOSIS — N186 End stage renal disease: Secondary | ICD-10-CM | POA: Diagnosis not present

## 2022-07-06 DIAGNOSIS — I129 Hypertensive chronic kidney disease with stage 1 through stage 4 chronic kidney disease, or unspecified chronic kidney disease: Secondary | ICD-10-CM | POA: Diagnosis not present

## 2022-07-06 DIAGNOSIS — Z992 Dependence on renal dialysis: Secondary | ICD-10-CM | POA: Diagnosis not present

## 2022-07-06 NOTE — Telephone Encounter (Signed)
Received a call from [facility] radiology about a recent CT enterography that was read. The CT enterography was performed on 2/28. It was read on 2/29. The following are the results:  IMPRESSION: 1. No acute findings in the abdomen or pelvis. 2. Post distal transverse and LEFT colectomy. Interval development of nonobstructing internal hernia of jejunum into the lesser sac but without signs of obstruction or bowel stranding currently. Correlate with site of symptoms. Consider close follow-up and correlation with any worsening symptoms as this configuration can be associated with bowel obstruction. 3. Signs of liver disease/cirrhosis, perhaps cardiogenic but would correlate with patient risk factors. 4. Atrophic bilateral kidneys in this patient with end-stage renal disease. 5. Large leiomyoma with calcification in the uterus is similar to previous imaging. 6. Small LEFT inguinal hernia contains a tiny amount of fluid and is unchanged compared to previous imaging. 7. Cardiac enlargement. 8. Extensive atherosclerotic changes ADDENDUM: Note that the internal hernia described in the initial report is a new finding compared to more recent images but there was herniation of a small amount of jejunum into the lesser sac as far back as 2020, increased herniation of small bowel into the lesser sac currently. Again findings are not associated with obstruction or stranding at this time.   Looking through the chart it looks like the patient has been having some intermittent abdominal discomfort after a recent single balloon enteroscopy.  This was ordered for further evaluation of pain and discomfort.  They have had some difficulty with trying to reach the patient as it looks based on the notes in the chart.  I called the only phone number in the chart for the patient.  Left voicemail. I then called the patient's daughter who directed the patient to call me back.  I was then able to connect  with the patient.  I have gone over the results.  She was aware of a previous internal hernia for which she has seen surgery in the past but declined a surgical candidate as result of her other medical comorbidities.  She is passing gas and diarrheal movements and her discomfort is tolerable.  She is eating and drinking.  She is aware that if she were to stop having bowel movements, had progressive abdominal pain, or worsening nausea or vomiting that this could be a sign that the internal hernia has led to an obstruction and she will need to come to the hospital.  Thankfully she does not have that.  I will forward these results to her primary gastroenterologist and primary care provider so that they are aware.  They will make a decision as to whether we referral to surgery may be needed or any additional workup.  The patient was appreciative for the call today.   Justice Britain, MD [facility] # [id number]

## 2022-07-07 ENCOUNTER — Telehealth: Payer: Self-pay | Admitting: Medical

## 2022-07-07 NOTE — Telephone Encounter (Signed)
Patient well-known to me.  She may be at dialysis today.  This is not a new problem though increased in size it seems.  She would not be an operative candidate to correct this unless there was an emergency.  She has follow-up with [name] on March 14.  She was given the appropriate instructions by the on-call gastroenterologist last night as far as what to do with severe pain etc.  Thanks for your help.

## 2022-07-07 NOTE — Telephone Encounter (Signed)
Pt called and cellphone went straight to voicemail , lvm to return call

## 2022-07-07 NOTE — Telephone Encounter (Signed)
Copied from [place] [phone]. Topic: Medicare AWV >> Jul 07, 2022 10:11 AM Devoria Glassing wrote: Reason for CRM: Called patient to schedule Medicare Annual Wellness Visit (AWV). Left message for patient to call back and schedule Medicare Annual Wellness Visit (AWV).  Last date of AWV: 07/15/21  Please schedule an appointment at any time with [facility].  If any questions, please contact me.  Thank you ,  Sherol Dade; [place] Direct Dial: [phone]

## 2022-07-10 ENCOUNTER — Ambulatory Visit (INDEPENDENT_AMBULATORY_CARE_PROVIDER_SITE_OTHER): Payer: 59

## 2022-07-10 ENCOUNTER — Encounter: Payer: Self-pay | Admitting: Internal Medicine

## 2022-07-10 DIAGNOSIS — N186 End stage renal disease: Secondary | ICD-10-CM | POA: Diagnosis not present

## 2022-07-10 DIAGNOSIS — T82858A Stenosis of vascular prosthetic devices, implants and grafts, initial encounter: Secondary | ICD-10-CM | POA: Diagnosis not present

## 2022-07-10 DIAGNOSIS — I428 Other cardiomyopathies: Secondary | ICD-10-CM

## 2022-07-10 DIAGNOSIS — L299 Pruritus, unspecified: Secondary | ICD-10-CM | POA: Diagnosis not present

## 2022-07-10 DIAGNOSIS — D631 Anemia in chronic kidney disease: Secondary | ICD-10-CM | POA: Diagnosis not present

## 2022-07-10 DIAGNOSIS — Z992 Dependence on renal dialysis: Secondary | ICD-10-CM | POA: Diagnosis not present

## 2022-07-10 DIAGNOSIS — I871 Compression of vein: Secondary | ICD-10-CM | POA: Diagnosis not present

## 2022-07-10 DIAGNOSIS — D509 Iron deficiency anemia, unspecified: Secondary | ICD-10-CM | POA: Diagnosis not present

## 2022-07-10 DIAGNOSIS — N2581 Secondary hyperparathyroidism of renal origin: Secondary | ICD-10-CM | POA: Diagnosis not present

## 2022-07-10 DIAGNOSIS — E119 Type 2 diabetes mellitus without complications: Secondary | ICD-10-CM | POA: Diagnosis not present

## 2022-07-11 ENCOUNTER — Encounter (HOSPITAL_COMMUNITY): Payer: Self-pay | Admitting: Cardiology

## 2022-07-11 ENCOUNTER — Ambulatory Visit (HOSPITAL_COMMUNITY)
Admission: RE | Admit: 2022-07-11 | Discharge: 2022-07-11 | Disposition: A | Discharge status: Home / Self Care | Payer: 59 | Source: Ambulatory Visit | Attending: Cardiology | Admitting: Cardiology

## 2022-07-11 VITALS — BP 108/52 | HR 78 | Ht 59.0 in | Wt 129.4 lb

## 2022-07-11 DIAGNOSIS — D500 Iron deficiency anemia secondary to blood loss (chronic): Secondary | ICD-10-CM | POA: Insufficient documentation

## 2022-07-11 DIAGNOSIS — Z7901 Long term (current) use of anticoagulants: Secondary | ICD-10-CM | POA: Insufficient documentation

## 2022-07-11 DIAGNOSIS — I959 Hypotension, unspecified: Secondary | ICD-10-CM | POA: Insufficient documentation

## 2022-07-11 DIAGNOSIS — I4719 Other supraventricular tachycardia: Secondary | ICD-10-CM | POA: Diagnosis not present

## 2022-07-11 DIAGNOSIS — I2510 Atherosclerotic heart disease of native coronary artery without angina pectoris: Secondary | ICD-10-CM | POA: Diagnosis not present

## 2022-07-11 DIAGNOSIS — I5032 Chronic diastolic (congestive) heart failure: Secondary | ICD-10-CM | POA: Insufficient documentation

## 2022-07-11 DIAGNOSIS — I428 Other cardiomyopathies: Secondary | ICD-10-CM | POA: Diagnosis not present

## 2022-07-11 DIAGNOSIS — N186 End stage renal disease: Secondary | ICD-10-CM | POA: Insufficient documentation

## 2022-07-11 DIAGNOSIS — I132 Hypertensive heart and chronic kidney disease with heart failure and with stage 5 chronic kidney disease, or end stage renal disease: Secondary | ICD-10-CM | POA: Diagnosis not present

## 2022-07-11 DIAGNOSIS — K7460 Unspecified cirrhosis of liver: Secondary | ICD-10-CM | POA: Diagnosis not present

## 2022-07-11 DIAGNOSIS — Z79899 Other long term (current) drug therapy: Secondary | ICD-10-CM | POA: Insufficient documentation

## 2022-07-11 DIAGNOSIS — Z992 Dependence on renal dialysis: Secondary | ICD-10-CM | POA: Insufficient documentation

## 2022-07-11 DIAGNOSIS — I4892 Unspecified atrial flutter: Secondary | ICD-10-CM | POA: Insufficient documentation

## 2022-07-11 DIAGNOSIS — E1122 Type 2 diabetes mellitus with diabetic chronic kidney disease: Secondary | ICD-10-CM | POA: Insufficient documentation

## 2022-07-11 DIAGNOSIS — I351 Nonrheumatic aortic (valve) insufficiency: Secondary | ICD-10-CM | POA: Insufficient documentation

## 2022-07-11 DIAGNOSIS — I5042 Chronic combined systolic (congestive) and diastolic (congestive) heart failure: Secondary | ICD-10-CM | POA: Diagnosis not present

## 2022-07-11 DIAGNOSIS — I480 Paroxysmal atrial fibrillation: Secondary | ICD-10-CM | POA: Insufficient documentation

## 2022-07-11 LAB — CUP PACEART REMOTE DEVICE CHECK
Battery Remaining Longevity: 1 mo — CL
Battery Voltage: 2.64 V
Brady Statistic AP VP Percent: 7.96 %
Brady Statistic AP VS Percent: 0.02 %
Brady Statistic AS VP Percent: 91.93 %
Brady Statistic AS VS Percent: 0.1 %
Brady Statistic RA Percent Paced: 7.98 %
Brady Statistic RV Percent Paced: 99.82 %
Date Time Interrogation Session: 20240304052728
HighPow Impedance: 48 Ohm
HighPow Impedance: 82 Ohm
Implantable Lead Connection Status: 753985
Implantable Lead Connection Status: 753985
Implantable Lead Connection Status: 753985
Implantable Lead Implant Date: 20080818
Implantable Lead Implant Date: 20080818
Implantable Lead Implant Date: 20080818
Implantable Lead Location: 753858
Implantable Lead Location: 753859
Implantable Lead Location: 753860
Implantable Lead Model: 4193
Implantable Lead Model: 5076
Implantable Lead Model: 6947
Implantable Pulse Generator Implant Date: 20170531
Lead Channel Impedance Value: 266 Ohm
Lead Channel Impedance Value: 342 Ohm
Lead Channel Impedance Value: 399 Ohm
Lead Channel Impedance Value: 4047 Ohm
Lead Channel Impedance Value: 4047 Ohm
Lead Channel Impedance Value: 513 Ohm
Lead Channel Pacing Threshold Amplitude: 0.875 V
Lead Channel Pacing Threshold Amplitude: 1.125 V
Lead Channel Pacing Threshold Amplitude: 1.375 V
Lead Channel Pacing Threshold Pulse Width: 0.4 ms
Lead Channel Pacing Threshold Pulse Width: 0.4 ms
Lead Channel Pacing Threshold Pulse Width: 0.4 ms
Lead Channel Sensing Intrinsic Amplitude: 11.5 mV
Lead Channel Sensing Intrinsic Amplitude: 11.5 mV
Lead Channel Sensing Intrinsic Amplitude: 3.25 mV
Lead Channel Sensing Intrinsic Amplitude: 3.25 mV
Lead Channel Setting Pacing Amplitude: 2 V
Lead Channel Setting Pacing Amplitude: 2 V
Lead Channel Setting Pacing Amplitude: 2.5 V
Lead Channel Setting Pacing Pulse Width: 0.4 ms
Lead Channel Setting Pacing Pulse Width: 0.4 ms
Lead Channel Setting Sensing Sensitivity: 0.3 mV
Zone Setting Status: 755011

## 2022-07-11 LAB — CBC
HCT: 39.9 % (ref 36.0–46.0)
Hemoglobin: 12.6 g/dL (ref 12.0–15.0)
MCH: 25 pg — ABNORMAL LOW (ref 26.0–34.0)
MCHC: 31.6 g/dL (ref 30.0–36.0)
MCV: 79 fL — ABNORMAL LOW (ref 80.0–100.0)
Platelets: 145 10*3/uL — ABNORMAL LOW (ref 150–400)
RBC: 5.05 MIL/uL (ref 3.87–5.11)
RDW: 18.5 % — ABNORMAL HIGH (ref 11.5–15.5)
WBC: 4.5 10*3/uL (ref 4.0–10.5)
nRBC: 0 % (ref 0.0–0.2)

## 2022-07-11 LAB — COMPREHENSIVE METABOLIC PANEL
ALT: 7 U/L (ref 0–44)
AST: 20 U/L (ref 15–41)
Albumin: 3.8 g/dL (ref 3.5–5.0)
Alkaline Phosphatase: 84 U/L (ref 38–126)
Anion gap: 16 — ABNORMAL HIGH (ref 5–15)
BUN: 28 mg/dL — ABNORMAL HIGH (ref 8–23)
CO2: 29 mmol/L (ref 22–32)
Calcium: 8.5 mg/dL — ABNORMAL LOW (ref 8.9–10.3)
Chloride: 89 mmol/L — ABNORMAL LOW (ref 98–111)
Creatinine, Ser: 9.42 mg/dL — ABNORMAL HIGH (ref 0.44–1.00)
GFR, Estimated: 4 mL/min — ABNORMAL LOW (ref >60)
Glucose, Bld: 97 mg/dL (ref 70–99)
Potassium: 3.9 mmol/L (ref 3.5–5.1)
Sodium: 134 mmol/L — ABNORMAL LOW (ref 135–145)
Total Bilirubin: 0.6 mg/dL (ref 0.3–1.2)
Total Protein: 8.4 g/dL — ABNORMAL HIGH (ref 6.5–8.1)

## 2022-07-11 LAB — TSH: TSH: 11.634 u[IU]/mL — ABNORMAL HIGH (ref 0.350–4.500)

## 2022-07-11 NOTE — Progress Notes (Signed)
PCP: Dr. Colin Benton Cardiology: Dr. Caryl Comes HF Cardiology: Dr. [name]  77 y.o. with history of nonischemic cardiomyopathy with recovery of EF with BiV pacing, recurrent GI bleeding, and paroxysmal atrial flutter was referred by Dr. Caryl Comes for evaluation of CHF.  Patient has a history of presumed nonischemic cardiomyopathy for a number of years.  Echo in 12/15 showed EF 25-30% and Cardiolite at that time showed no ischemia.  She had Medtronic CRT-D system placed, and EF recovered. In 4/19, echo showed EF 60-65%.    She has additionally had problems with GI bleeding.  She had been on Eliquis for atrial flutter but this was stopped with recurrent GI bleeding.  She had bleeding from a gastric ulcer but was also noted to have multiple small bowel AVMs on capsule endoscopy out of reach of endoscope.  Eliquis was stopped. She has had no melena or BRBPR off Eliquis.   Echo in 10/19 showed EF 55-60%, mild-moderate MR, moderate AI, moderate RV dilation.   She was admitted in 1/20 with diverticulitis and ended up having a sigmoid colectomy.    Echo in 9/20 showed EF lower at 45-50%, moderate AI.   She had LHC/RHC in 8/21 with nonobstructive CAD, normal filling pressures, and preserved cardiac output. Echo 8/21 showed EF 50-55%, mild LVH, mildly decreased RV systolic function, moderate aortic insufficiency.  Back in in A fib the end of October. Amio was increased and DC-CV was set up for November 19th.  Chemically converted so DC-CV canceled.    Follow up in HF Clinic in 10/21 and was more fatigued. Found to be in atrial flutter but hgb also 6.9. She was on Eliquis at the time. Loaded on amio and converted to NSR. Patient underwent small bowel enteroscopy 03/02/2020 with findings trace esophageal varix, small superficial AVMs prepyloric stomach with other punctate vascular lesions treated with APC, benign duodenal polyp.  Etiology likely secondary to jejunal AVM versus early GAVE vs portal gastropathy.Patient  underwent small bowel enteroscopy 03/02/2020 with findings trace esophageal varix, small superficial AVMs prepyloric stomach with other punctate vascular lesions treated with APC, benign duodenal polyp.  Etiology likely secondary to jejunal AVM versus early GAVE vs portal gastropathy. Eliquis restarted.    Hgb dropped back down to 7.7 in 12/21. Saw [name] and capsule placed but got stuck in the stomach.    Had volume overload on device in 05/24/20 and torsemide increased by Dr. [name]    She was admitted on 05/27/20 with dizziness, and a fall preceded by melena found to have further GI bleeding hgb of 5.1 and an AKI.  Eliquis was held and she was transfused 2u PRBC's.  Repeat pill endoscope showed bleeding in distal small bowel.  Colonoscopy found no bleeding but large avm in cecum argon lasered.  She has been off anticoagulants.   Admitted 06/23/20 from [facility] with markedly elevated filling pressures. Aggressively diuresed but had worsening renal function and progressed to ESRD. Nephrology consulted. She tolerated HD well and is on MWF schedule.  Echo in 2/22 showed EF 55-60%, low normal RV systolic function.   Saw EP, turned down for Watchman due to comorbidities.   Echo in 2/23 showed EF 55-60%, moderate LVH, moderate RV enlargement with mildly decreased RV systolic function, moderate TR, ?moderate-severe AI.   Underwent TEE only (3/23) as she spontaneously converted to NSR, showing mild to moderate MR, mod to severe AI. Unable to get MRI due to device.    In 5/23, she had DCCV to NSR (was  in atrial tachycardia).  She had recurrence of atrial tachycardia after 5/23 DCCV.  This was transient.  She was seen by Dr. Curt Bears for evaluation for atrial tachycardia, but he thought that she was too high risk for ablation.   In 6/23, she developed a diverticular bleed, Eliquis was stopped transiently.   Follow up 8/23, NYHA IIIb and volume overloaded.  Echo in 8/23 showed EF 50-55%, moderate RV dilation,  moderate AI. Admitted 10/23 with GIB. Seen by GI and underwent EGD with nonbleeding AVMs, s/p APC. Admitted 11/23 with COPD exacerbation.  Ongoing GI bleeding, had small bowel enteroscopy in 2/24 with 2 duodenal AVMs treated.   Today she returns for HF follow up. No further melena/BRBPR.  She remains on apixaban 2.5 mg bid. No lightheadedness.  Stable weight.  She has generalized fatigue and is short of breath walking 200 feet.  No chest pain.  Tolerating HD.   ECG (personally reviewed): NSR, BiV pacing.   Medtronic device interrogation (personally reviewed): >99% BiV pacing, stable thoracic impedance, rare atrial fibrillation.   Labs (9/19):  Hgb 10.2, K 3.8, creatinine 2.5 Labs (10/19): K 3.4, creatinine 2.2, WBC 76.  Labs (2/20): K 3.9, creatinine 1.8 Labs (8/20): K 3.8, creatinine 1.98 Labs (3/21): LDL 33 Labs (5/21): K 3.6, creatinine 1.69 Labs (7/21): K 4, creatinine 1.94 Labs (9/21): K 4.5, creatinine 2.24, TSH normal, HFTs normal  Labs (11/21): K 4.2 Creatinine 3.05  Labs (1/22): K 4.0, creatinine 2.22 Labs (2/22): K 4.1, creatinine 2.54 Labs (8/22): TSH 6.06 Labs (9/22): LFTs normal, hgb 10.5.  Labs (7/23): BNP 2020, hgb 11, LFTs normal Labs (9/23): TSH normal Labs (12/23): LFTs normal, hgb 11  PMH: 1. Diabetes: diet-controlled.  2. Gout 3. HCV: Treated with Harvoni.  4. Atrial flutter/atrial tachycardia: Atypical.  Had DCCV in 3/19, maintained on amiodarone.  - DCCV in 5/23 5. HTN 6. ESRD: MWF HD 7. H/o SBO 8. Obesity 9. Nonischemic cardiomyopathy: Echo in 12/15 with EF 25-30%.  Cardiolite in 12/15 with no ischemia.  She had a Medtronic CRT-D device placed with recovery of EF.   - Echo (4/19): EF 60-65% with moderate AI, moderate MR, and PASP 49 mmHg.  - RHC 02/13/2018 RA mean 6, RV 49/7,PA 50/18, mean 31,PCWP mean 20, Oxygen saturations:PA 67% AO 97%, Cardiac Output (Fick) 4.6, Cardiac Index (Fick) 2.9, PVR 2.4 WU - Echo (10/19): EF 55-60%, mild-moderate MR, moderate  AI, moderate RV dilation, PASP 60 mmHg.  - PYP scan (3/20): H/CL 1.3, grade 1-2 visually.  Unlikely transthyretin cardiac amyloidosis.  - Echo (9/20): EF 45-50%, moderate aortic insufficiency.  - LHC/RHC (8/21): 40% pLAD, 50% D1; mean RA 3, PA 41/16, mean PCWP 14, CI 2.74, PVR 2.99.  - Echo (8/21): EF 50-55%, mild LVH, grade 2 diastolic dysfunction, normal RV size with mildly decreased systolic function, moderate AI, no AS - PYP scan (1/22): H/CL 1.1, grade 1-2 => probably negative for TTR cardiac amyloidosis.  - Echo 07/2020  EF 55-60%, moderate LVH, D-shaped interventricular septum, RV moderately dilated with low normal function, severe biatrial enlargement, severe TR.   - RHC 06/2020:  RA mean 21 RV 49/20 PA 59/24, mean 39 PCWP mean 26 Cardiac Output (Fick) 5.07 Cardiac Index (Fick) 3.23 - Echo (2/23): EF 55-60%, moderate LVH, moderate RV enlargement with mildly decreased RV systolic function, moderate TR, ?moderate-severe AI.  PVR 2.6 WU Cardiac Output (Thermo) 4.72 Cardiac Index (Thermo) 3.01 PVR 2.75 WU PAPI 1.7.  - TEE (3/23): EF 60-65%, moderate LVH, RV ok,  moderate to severe MR, moderate to severe AI with PHT 413 msec and vena contracta 0.49 cm (moderate AI by measurements). - Echo (8/23): EF 50-55%, moderate RV dilation, moderate AI 10. GI bleeding: Gastric AVM and also gastric ulcer seen in past.  - 8/19 capsule endoscopy showed small bowel AVMs beyond the reach of endoscopy.  - With recurrent episodes of GI bleeding, Eliquis was stopped.  - Capsule study 1/22 showed bleeding in distal small bowel.  Colonoscopy found no bleeding but large avm in cecum argon lasered.  - Small bowel enteroscopy 2/24 with duodenal AVMs treated with APC.  11. Fe deficiency anemia.  12. Depression. 13. OSA: Uses CPAP.  14. Diverticulitis: Sigmoid colectomy in 1/20.  15. Aortic insufficiency: Moderate AI on 8/23 echo.   ROS: All systems reviewed and negative except as per HPI.   Social History    Socioeconomic History   Marital status: Divorced    Spouse name: Not on file   Number of children: 1   Years of education: 12   Highest education level: Not on file  Occupational History   Not on file  Tobacco Use   Smoking status: Never   Smokeless tobacco: Never  Vaping Use   Vaping Use: Never used  Substance and Sexual Activity   Alcohol use: Not Currently    Comment: 01/03/2018 "couple drinks/month"   Drug use: Not Currently    Types: Marijuana    Comment: VERY INTERMITTENT    Sexual activity: Not Currently  Other Topics Concern   Not on file  Social History Narrative   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      Home Situation: lives alone      Spiritual Beliefs: Christian - Baptist      Lifestyle: no regular exercise; poor diet      She is divorced at least one adult child   Never smoker    rare alcohol to occasional at best    no substance abuse         Social Determinants of Health   Financial Resource Strain: Low Risk  (07/15/2021)   Overall Financial Resource Strain (CARDIA)    Difficulty of Paying Living Expenses: Not hard at all  Food Insecurity: No Food Insecurity (06/07/2022)   Hunger Vital Sign    Worried About Running Out of Food in the Last Year: Never true    [place] in the Last Year: Never true  Transportation Needs: No Transportation Needs (06/07/2022)   PRAPARE - Hydrologist (Medical): No    Lack of Transportation (Non-Medical): No  Physical Activity: Not on file  Stress: No Stress Concern Present (07/15/2021)   [place]    Feeling of Stress : Not at all  Social Connections: Moderately Isolated (07/15/2021)   Social Connection and Isolation Panel [NHANES]    Frequency of Communication with Friends and Family: More than three times a week    Frequency of Social Gatherings with Friends and Family: Three times a week    Attends  Religious Services: More than 4 times per year    Active Member of Clubs or Organizations: No    Attends Archivist Meetings: Never    Marital Status: Divorced  Human resources officer Violence: Not At Risk (06/07/2022)   Humiliation, Afraid, Rape, and Kick questionnaire    Fear of Current or Ex-Partner: No    Emotionally Abused: No  Physically Abused: No    Sexually Abused: No   Family History  Problem Relation Age of Onset   Asthma Father    Heart attack Father    Asthma Sister    Lung cancer Sister    Heart attack Mother    Stroke Brother    Colon cancer Neg Hx    Esophageal cancer Neg Hx    Pancreatic cancer Neg Hx    Stomach cancer Neg Hx    Liver disease Neg Hx    Current Outpatient Medications  Medication Sig Dispense Refill   albuterol (VENTOLIN HFA) 108 (90 Base) MCG/ACT inhaler Inhale 2 puffs into the lungs every 6 (six) hours as needed for wheezing or shortness of breath. 8 g 6   amiodarone (PACERONE) 200 MG tablet Take 1 tablet (200 mg total) by mouth daily. 90 tablet 3   apixaban (ELIQUIS) 2.5 MG TABS tablet Take 1 tablet (2.5 mg total) by mouth 2 (two) times daily. 60 tablet 11   cyclobenzaprine (FLEXERIL) 5 MG tablet Take 5 mg by mouth at bedtime as needed for muscle spasms.     gabapentin (NEURONTIN) 300 MG capsule Take 1 capsule (300 mg total) by mouth daily. Fill one rx. Computer may have sent over 2 (Patient taking differently: Take 300 mg by mouth daily.) 90 capsule 2   ipratropium (ATROVENT HFA) 17 MCG/ACT inhaler Inhale 2 puffs into the lungs every 6 (six) hours as needed for wheezing. 12.9 g 1   levothyroxine (SYNTHROID) 125 MCG tablet Take 1 tablet (125 mcg total) by mouth daily before breakfast. 30 tablet 11   nitroGLYCERIN (NITROSTAT) 0.4 MG SL tablet Place 1 tablet (0.4 mg total) under the tongue every 5 (five) minutes as needed for chest pain. 25 tablet 0   traZODone (DESYREL) 50 MG tablet Take 0.5-1 tablets (25-50 mg total) by mouth at bedtime as  needed for sleep. 30 tablet 3   rOPINIRole (REQUIP) 0.25 MG tablet Take 0.25 mg by mouth at bedtime. (Patient not taking: Reported on 07/11/2022)     No current facility-administered medications for this encounter.   BP (!) 108/52   Pulse 78   Ht '4\' 11"'$  (1.499 m)   Wt 58.7 kg (129 lb 6.4 oz)   SpO2 99%   BMI 26.14 kg/m    Wt Readings from Last 3 Encounters:  07/11/22 58.7 kg (129 lb 6.4 oz)  06/21/22 59 kg (130 lb)  06/08/22 63.3 kg (139 lb 8.8 oz)   General: NAD Neck: No JVD, no thyromegaly or thyroid nodule.  Lungs: Clear to auscultation bilaterally with normal respiratory effort. CV: Nondisplaced PMI.  Heart regular S1/S2, no [id number], 1/6 diastolic murmur upper sternal border.  No peripheral edema.  No carotid bruit.  Normal pedal pulses.  Abdomen: Soft, nontender, no hepatosplenomegaly, no distention.  Skin: Intact without lesions or rashes.  Neurologic: Alert and oriented x 3.  Psych: Normal affect. Extremities: No clubbing or cyanosis.  HEENT: Normal.   Assessment/Plan: 1. NICM, recovered, now with primarily diastolic CHF:  Echo in 0000000 showed EF improved to 60-65% with MDT BiV pacing. Echo in 10/19 showed stable EF 55-60% with mild-moderate MR, moderate AI. PYP scan in 3/20 was equivocal for transthyretin amyloidosis, repeat PYP scan in 1/22 was probably negative for TTR cardiac amyloidosis.  Echo in 9/20 showed EF back down to 45-50%. LHC/RHC in 8/21 showed nonobstructive CAD, normal filling pressures and cardiac output. Last echo 2/23 showed EF 55-60%, moderate LVH, moderate RV enlargement with mildly decreased  RV systolic function, moderate TR, ? moderate-severe AI.  TEE (3/23) EF 60-65%, moderate LVH, RV ok, moderate to severe MR, moderate to severe AI. Echo 8/23 EF 50-55%, with moderate RV dilation, moderate AI. Volume now managed by HD. Chronic NYHA class III symptoms.  She is not volume overloaded by exam or Optivol.  - HD for volume management. - Off Coreg and Bidil with  low BP at HD.  - She will be having her device generator changed soon.  2. Atrial fibrillation/atrial flutter/atrial tachycardia: Paroxysmal.  She has had multiple GI bleeds, and she was deemed a poor Watchman candidate due to comorbidities by Dr. Quentin Ore.  Currently no evidence for overt bleeding.  Seen by Dr. Curt Bears, not thought to be candidate for atrial tachycardia ablation due to high risk.  She is in NSR today.  - Continue Eliquis 2.5 mg bid. CBC today.  - Continue amiodarone, check TSH and LFTs.  She needs regular eye exam.  3. ESRD: Now on HD M-W-F 4. Anemia, chronic blood loss: Long history multiple bleeding events while on Eliquis from GI AVMs.  No overt bleeding currently. - CBC today.  5. Cirrhosis: S/p treatment with Harvoni for Hep C, followed by GI outpatient.     6. Aortic insufficiency:  TEE 3/23 showed moderate to severe AI (PHT 413 msec and vena contracta 0.49 cm).  Cannot do cardiac MRI as ICD not compatible. Repeat echo 8/23 showed moderate AI. She would likely be a TAVR candidate.  - I will repeat echo at followup in 6 months.   Follow up in 6 months with echo.   Loralie Champagne  07/11/2022

## 2022-07-11 NOTE — Patient Instructions (Signed)
There has been no changes to your medications.  Labs done today, your results will be available in MyChart, we will contact you for abnormal readings.  Your physician has requested that you have an echocardiogram. Echocardiography is a painless test that uses sound waves to create images of your heart. It provides your doctor with information about the size and shape of your heart and how well your heart's chambers and valves are working. This procedure takes approximately one hour. There are no restrictions for this procedure. Please do NOT wear cologne, perfume, aftershave, or lotions (deodorant is allowed). Please arrive 15 minutes prior to your appointment time.  Your physician recommends that you schedule a follow-up appointment in: 6 months with an echocardiogram ( September) **please call the office in July to arrange your follow up appointment. **  If you have any questions or concerns before your next appointment please send [name] a message through [place] or call our office at [phone].    TO LEAVE A MESSAGE FOR THE NURSE SELECT OPTION 2, PLEASE LEAVE A MESSAGE INCLUDING: YOUR NAME DATE OF BIRTH CALL BACK NUMBER REASON FOR CALL**this is important as we prioritize the call backs  YOU WILL RECEIVE A CALL BACK THE SAME DAY AS LONG AS YOU CALL BEFORE 4:00 PM  At the [facility], you and your health needs are our priority. As part of our continuing mission to provide you with exceptional heart care, we have created designated Provider Care Teams. These Care Teams include your primary Cardiologist (physician) and Advanced Practice Providers (APPs- Physician Assistants and Nurse Practitioners) who all work together to provide you with the care you need, when you need it.   You may see any of the following providers on your designated Care Team at your next follow up: Dr Glori Bickers Dr Loralie Champagne Dr. Roxana Hires, NP Lyda Jester, [place] [facility] [place], [place] Forestine Na, NP Audry Riles, PharmD   Please be sure to bring in all your medications bottles to every appointment.    Thank you for choosing [facility]

## 2022-07-12 DIAGNOSIS — E119 Type 2 diabetes mellitus without complications: Secondary | ICD-10-CM | POA: Diagnosis not present

## 2022-07-12 DIAGNOSIS — N186 End stage renal disease: Secondary | ICD-10-CM | POA: Diagnosis not present

## 2022-07-12 DIAGNOSIS — D509 Iron deficiency anemia, unspecified: Secondary | ICD-10-CM | POA: Diagnosis not present

## 2022-07-12 DIAGNOSIS — N2581 Secondary hyperparathyroidism of renal origin: Secondary | ICD-10-CM | POA: Diagnosis not present

## 2022-07-12 DIAGNOSIS — L299 Pruritus, unspecified: Secondary | ICD-10-CM | POA: Diagnosis not present

## 2022-07-12 DIAGNOSIS — D631 Anemia in chronic kidney disease: Secondary | ICD-10-CM | POA: Diagnosis not present

## 2022-07-12 DIAGNOSIS — Z992 Dependence on renal dialysis: Secondary | ICD-10-CM | POA: Diagnosis not present

## 2022-07-12 LAB — LIPID PANEL
Cholesterol: 159 mg/dL (ref 0–200)
HDL: 72 mg/dL (ref >40)
LDL Cholesterol: 72 mg/dL (ref 0–99)
Total CHOL/HDL Ratio: 2.2 RATIO
Triglycerides: 76 mg/dL (ref <150)
VLDL: 15 mg/dL (ref 0–40)

## 2022-07-13 ENCOUNTER — Encounter: Payer: Self-pay | Admitting: Oncology

## 2022-07-14 DIAGNOSIS — L299 Pruritus, unspecified: Secondary | ICD-10-CM | POA: Diagnosis not present

## 2022-07-14 DIAGNOSIS — D509 Iron deficiency anemia, unspecified: Secondary | ICD-10-CM | POA: Diagnosis not present

## 2022-07-14 DIAGNOSIS — Z992 Dependence on renal dialysis: Secondary | ICD-10-CM | POA: Diagnosis not present

## 2022-07-14 DIAGNOSIS — N2581 Secondary hyperparathyroidism of renal origin: Secondary | ICD-10-CM | POA: Diagnosis not present

## 2022-07-14 DIAGNOSIS — E119 Type 2 diabetes mellitus without complications: Secondary | ICD-10-CM | POA: Diagnosis not present

## 2022-07-14 DIAGNOSIS — D631 Anemia in chronic kidney disease: Secondary | ICD-10-CM | POA: Diagnosis not present

## 2022-07-14 DIAGNOSIS — N186 End stage renal disease: Secondary | ICD-10-CM | POA: Diagnosis not present

## 2022-07-17 DIAGNOSIS — D509 Iron deficiency anemia, unspecified: Secondary | ICD-10-CM | POA: Diagnosis not present

## 2022-07-17 DIAGNOSIS — E119 Type 2 diabetes mellitus without complications: Secondary | ICD-10-CM | POA: Diagnosis not present

## 2022-07-17 DIAGNOSIS — N2581 Secondary hyperparathyroidism of renal origin: Secondary | ICD-10-CM | POA: Diagnosis not present

## 2022-07-17 DIAGNOSIS — N186 End stage renal disease: Secondary | ICD-10-CM | POA: Diagnosis not present

## 2022-07-17 DIAGNOSIS — Z992 Dependence on renal dialysis: Secondary | ICD-10-CM | POA: Diagnosis not present

## 2022-07-17 DIAGNOSIS — L299 Pruritus, unspecified: Secondary | ICD-10-CM | POA: Diagnosis not present

## 2022-07-17 DIAGNOSIS — D631 Anemia in chronic kidney disease: Secondary | ICD-10-CM | POA: Diagnosis not present

## 2022-07-18 ENCOUNTER — Other Ambulatory Visit: Payer: Self-pay | Admitting: Medical

## 2022-07-18 DIAGNOSIS — Z1231 Encounter for screening mammogram for malignant neoplasm of breast: Secondary | ICD-10-CM

## 2022-07-19 ENCOUNTER — Telehealth: Payer: Self-pay

## 2022-07-19 DIAGNOSIS — D631 Anemia in chronic kidney disease: Secondary | ICD-10-CM | POA: Diagnosis not present

## 2022-07-19 DIAGNOSIS — E119 Type 2 diabetes mellitus without complications: Secondary | ICD-10-CM | POA: Diagnosis not present

## 2022-07-19 DIAGNOSIS — N186 End stage renal disease: Secondary | ICD-10-CM | POA: Diagnosis not present

## 2022-07-19 DIAGNOSIS — Z992 Dependence on renal dialysis: Secondary | ICD-10-CM | POA: Diagnosis not present

## 2022-07-19 DIAGNOSIS — L299 Pruritus, unspecified: Secondary | ICD-10-CM | POA: Diagnosis not present

## 2022-07-19 DIAGNOSIS — D509 Iron deficiency anemia, unspecified: Secondary | ICD-10-CM | POA: Diagnosis not present

## 2022-07-19 DIAGNOSIS — N2581 Secondary hyperparathyroidism of renal origin: Secondary | ICD-10-CM | POA: Diagnosis not present

## 2022-07-19 NOTE — Telephone Encounter (Signed)
Pt's Daughter is aware of her procedure time change. She is to arrive at 8am. I also ask her to make sure the pt is holding her Eliquis as requested.

## 2022-07-20 ENCOUNTER — Ambulatory Visit (INDEPENDENT_AMBULATORY_CARE_PROVIDER_SITE_OTHER): Payer: 59 | Admitting: Physician Assistant

## 2022-07-20 ENCOUNTER — Encounter: Payer: Self-pay | Admitting: Physician Assistant

## 2022-07-20 ENCOUNTER — Other Ambulatory Visit (INDEPENDENT_AMBULATORY_CARE_PROVIDER_SITE_OTHER): Payer: 59

## 2022-07-20 VITALS — BP 98/60 | HR 85 | Ht 59.0 in | Wt 126.4 lb

## 2022-07-20 DIAGNOSIS — D509 Iron deficiency anemia, unspecified: Secondary | ICD-10-CM | POA: Diagnosis not present

## 2022-07-20 DIAGNOSIS — D631 Anemia in chronic kidney disease: Secondary | ICD-10-CM | POA: Diagnosis not present

## 2022-07-20 DIAGNOSIS — E119 Type 2 diabetes mellitus without complications: Secondary | ICD-10-CM | POA: Diagnosis not present

## 2022-07-20 DIAGNOSIS — D638 Anemia in other chronic diseases classified elsewhere: Secondary | ICD-10-CM

## 2022-07-20 DIAGNOSIS — K5520 Angiodysplasia of colon without hemorrhage: Secondary | ICD-10-CM | POA: Diagnosis not present

## 2022-07-20 DIAGNOSIS — K469 Unspecified abdominal hernia without obstruction or gangrene: Secondary | ICD-10-CM

## 2022-07-20 DIAGNOSIS — R197 Diarrhea, unspecified: Secondary | ICD-10-CM

## 2022-07-20 DIAGNOSIS — Z992 Dependence on renal dialysis: Secondary | ICD-10-CM | POA: Diagnosis not present

## 2022-07-20 DIAGNOSIS — N186 End stage renal disease: Secondary | ICD-10-CM | POA: Diagnosis not present

## 2022-07-20 DIAGNOSIS — N2581 Secondary hyperparathyroidism of renal origin: Secondary | ICD-10-CM | POA: Diagnosis not present

## 2022-07-20 DIAGNOSIS — L299 Pruritus, unspecified: Secondary | ICD-10-CM | POA: Diagnosis not present

## 2022-07-20 LAB — CBC WITH DIFFERENTIAL/PLATELET
Basophils Absolute: 0.1 10*3/uL (ref 0.0–0.1)
Basophils Relative: 1.2 % (ref 0.0–3.0)
Eosinophils Absolute: 0.2 10*3/uL (ref 0.0–0.7)
Eosinophils Relative: 3.9 % (ref 0.0–5.0)
HCT: 41.6 % (ref 36.0–46.0)
Hemoglobin: 13.5 g/dL (ref 12.0–15.0)
Lymphocytes Relative: 21.6 % (ref 12.0–46.0)
Lymphs Abs: 1.1 10*3/uL (ref 0.7–4.0)
MCHC: 32.5 g/dL (ref 30.0–36.0)
MCV: 78.8 fl (ref 78.0–100.0)
Monocytes Absolute: 0.4 10*3/uL (ref 0.1–1.0)
Monocytes Relative: 8.1 % (ref 3.0–12.0)
Neutro Abs: 3.4 10*3/uL (ref 1.4–7.7)
Neutrophils Relative %: 65.2 % (ref 43.0–77.0)
Platelets: 150 10*3/uL (ref 150.0–400.0)
RBC: 5.28 Mil/uL — ABNORMAL HIGH (ref 3.87–5.11)
RDW: 20.4 % — ABNORMAL HIGH (ref 11.5–15.5)
WBC: 5.3 10*3/uL (ref 4.0–10.5)

## 2022-07-20 NOTE — Patient Instructions (Signed)
_______________________________________________________  If your blood pressure at your visit was 140/90 or greater, please contact your primary care physician to follow up on this.  _______________________________________________________  If you are age 75 or older, your body mass index should be between 23-30. Your Body mass index is 25.53 kg/m. If this is out of the aforementioned range listed, please consider follow up with your Primary Care Provider.  If you are age 23 or younger, your body mass index should be between 19-25. Your Body mass index is 25.53 kg/m. If this is out of the aformentioned range listed, please consider follow up with your Primary Care Provider.   Your provider has requested that you go to the basement level for lab work before leaving today. Press "B" on the elevator. The lab is located at the first door on the left as you exit the elevator.   Use Imodium over the counter . Take one every 4-6 hours for diarrhea  ________________________________________________________  The [facility] providers would like to encourage you to use [facility] to communicate with providers for non-urgent requests or questions.  Due to long hold times on the telephone, sending your provider a message by [facility] may be a faster and more efficient way to get a response.  Please allow 48 business hours for a response.  Please remember that this is for non-urgent requests.   It was a pleasure to see you today!  Thank you for trusting me with your gastrointestinal care!

## 2022-07-20 NOTE — Progress Notes (Signed)
Subjective:    Patient ID: Morgan Clay, female    DOB: 06/29/1945, 77 y.o.   MRN: [id number]  HPI Daleyssa is a pleasant 77 year old African-American female, established with [name].  She comes in today after recent single balloon enteroscopy and CT of the abdomen and pelvis to discuss findings.  She also has new complaint of diarrhea which she says has been present since she left the hospital in early February. Patient has history of end-stage renal disease, on dialysis, atrial fibs/flutter, on Eliquis, hypertension, pulmonary hypertension, nonischemic cardiomyopathy s/p ICD, and congestive heart failure. She is s/p sigmoid resection 2020 for diverticular disease. She has had issues with recurrent significant anemia, iron deficiency and melena and known AVMs.  Also with compensated cirrhosis secondary to hepatitis C. She was recently hospitalized in late January with weakness and fatigue and found to have hemoglobin of 5.9 down from hemoglobin of 11 in December 2023.  She had been complaining of dark stools.  She had undergone complete workup in November 2023 when hospitalized and had EGD at that time with normal-appearing esophagus and stomach, there were 3 AVMs in the duodenal bulb which were treated with APC.  EGD and colonoscopy in June 2023 when hospitalized for similar symptoms with finding of multiple diverticuli, patent anastomosis in the rectosigmoid colon, internal hemorrhoids and no active bleeding.  EGD had shown a few gastric erosions and otherwise was negative.  Last capsule endoscopy January 2022 with gastric retention. Enteroscopy October 2021 1 column of grade 1 esophageal varices nonbleeding and 1 AVM in the prepylorus.  With last hospitalization she underwent enteroscopy with finding of 2 small ems in the second and third portion of the duodenum treated with APC and 2 AVMs in the proximal jejunum treated with APC. Capsule endoscopy was done showing 1 medium to large  AVM in the proximal small bowel, and 1 medium to large AVM in the more distal small bowel beyond where a previous tattoo had been placed.  Decision was made to proceed with single balloon enteroscopy with Dr. Bryan Lemma and this was done outpatient on 06/21/2022.  Noting a normal-appearing esophagus and stomach there were 2 nonbleeding AVMs in the fourth portion of the duodenum treated with APC and 1 nonbleeding jejunal ulcer treated with APC and clip.  She had then called complaining of abdominal pain, CT of the abdomen and pelvis was done on 07/05/2022. This showed  a cirrhotic appearing liver , prior left hemicolectomy, some mid SMA narrowing without change, small left ing hernia , and interval development of a non obstructing hernia of the jejunum in to the lesser sac- reviewing per radiology showed this to be present back to 2020.  She says that she has been having some intermittent mid abdominal discomfort, not severe, she has not noticed any worsening postprandially or change with bowel movements.  She has developed diarrhea, onset after she was discharged from the hospital early in February.  She says the worst time is nighttime when she has had up to 3-4 bowel movements during the night with liquid diarrhea she has been using Pepto-Bismol which does help.  She says the daytime is not as bad, frequently will not have a bowel movement early in the morning but then tends to develop loose stools later in the day.  She says at worst she would have 7-8 bowel movements per day, no melena or hematochezia. She denies any recent antibiotics, no changes in meds She is frustrated by the diarrhea  Review of Systems Pertinent positive and negative review of systems were noted in the above HPI section.  All other review of systems was otherwise negative.   Outpatient Encounter Medications as of 07/20/2022  Medication Sig   albuterol (VENTOLIN HFA) 108 (90 Base) MCG/ACT inhaler Inhale 2 puffs into  the lungs every 6 (six) hours as needed for wheezing or shortness of breath.   amiodarone (PACERONE) 200 MG tablet Take 1 tablet (200 mg total) by mouth daily.   apixaban (ELIQUIS) 2.5 MG TABS tablet Take 1 tablet (2.5 mg total) by mouth 2 (two) times daily.   cyclobenzaprine (FLEXERIL) 5 MG tablet Take 5 mg by mouth at bedtime as needed for muscle spasms.   gabapentin (NEURONTIN) 300 MG capsule Take 1 capsule (300 mg total) by mouth daily. Fill one rx. Computer may have sent over 2 (Patient taking differently: Take 300 mg by mouth daily.)   ipratropium (ATROVENT HFA) 17 MCG/ACT inhaler Inhale 2 puffs into the lungs every 6 (six) hours as needed for wheezing.   levothyroxine (SYNTHROID) 125 MCG tablet Take 1 tablet (125 mcg total) by mouth daily before breakfast.   lidocaine-prilocaine (EMLA) cream Apply 1 Application topically as needed.   nitroGLYCERIN (NITROSTAT) 0.4 MG SL tablet Place 1 tablet (0.4 mg total) under the tongue every 5 (five) minutes as needed for chest pain.   [DISCONTINUED] rOPINIRole (REQUIP) 0.25 MG tablet Take 0.25 mg by mouth at bedtime. (Patient not taking: Reported on 07/11/2022)   [DISCONTINUED] traZODone (DESYREL) 50 MG tablet Take 0.5-1 tablets (25-50 mg total) by mouth at bedtime as needed for sleep. (Patient not taking: Reported on 07/18/2022)   No facility-administered encounter medications on file as of 07/20/2022.   No Known Allergies Patient Active Problem List   Diagnosis Date Noted   Acute upper GI bleeding 06/07/2022   Upper airway cough syndrome 05/17/2022   DOE (dyspnea on exertion) 03/27/2022   Prolonged QT interval 03/06/2022   Acute lower GI bleeding 10/25/2021   Transient hypotension 10/25/2021   Arthritis 09/06/2021   Chronic bronchitis ([place]) 09/06/2021   Chronic combined systolic (congestive) and diastolic (congestive) heart failure ([place]) 09/06/2021   History of blood transfusion 09/06/2021   Lower GI bleeding 09/06/2021   Carpal tunnel syndrome,  bilateral 06/09/2021   Lesion of ulnar nerve 06/09/2021   Polyneuropathy associated with underlying disease ([place]) 06/09/2021   ESRD on dialysis ([place]) 03/22/2021   Esophageal varices without bleeding ([place]) 12/23/2020   Atrial fibrillation with RVR ([place]) 11/17/2020   Bleeding pseudoaneurysm of left brachiocephalic arteriovenous fistula ([place]) 09/24/2020   Bleeding pseudoaneurysm of left brachiocephalic AV fistula ([place]) 09/24/2020   Hepatic cirrhosis ([place]) 08/12/2020   Malnutrition of moderate degree 07/09/2020   Angiodysplasia of small intestine 06/10/2020   Acute blood loss anemia 05/27/2020   COVID-19 virus infection 05/27/2020   Anemia due to chronic blood loss 04/15/2020   Overweight (BMI 25.0-29.9) 03/02/2020   Chronic anticoagulation    Persistent atrial fibrillation ([place]) 02/06/2020   Secondary hypercoagulable state ([place]) 02/06/2020   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 (HCV) ([place])    Gout    Pain in right knee 05/13/2018   Anemia 01/18/2018   Pulmonary hypertension ([place]) on echocardiogram 01/14/2018   AVM (arteriovenous malformation) of small bowel, acquired 01/14/2018   Glaucoma 01/03/2018   Aortic atherosclerosis ([place]) 08/30/2017   Diverticulitis of left colon status post robotic low anterior to sigmoid resection 05/22/2018 07/31/2017   Hypertension associated with diabetes ([place]) 06/07/2017   MDD (major  depressive disorder), recurrent, in partial remission ([place]) 06/07/2017   Asthma 05/22/2017   Chronic obstructive pulmonary disease ([facility])    Cough    AICD (automatic cardioverter/defibrillator) present 10/06/2015   Atypical atrial flutter ([place]) 09/28/2015   Type 2 diabetes, controlled, with renal manifestation ([place]) 11/24/2013   Nonischemic cardiomyopathy ([place]) 11/22/2010   DEPRESSION 12/17/2009   OSTEOPENIA 09/30/2008   FIBROIDS, UTERUS 03/05/2008   Chronic diastolic CHF (congestive heart failure) ([place]) 04/24/2007   Acquired hypothyroidism 01/28/2007    Biventricular ICD (implantable cardioverter-defibrillator) in place 01/08/2007   Social History   Socioeconomic History   Marital status: Divorced    Spouse name: Not on file   Number of children: 1   Years of education: 12   Highest education level: Not on file  Occupational History   Not on file  Tobacco Use   Smoking status: Never   Smokeless tobacco: Never  Vaping Use   Vaping Use: Never used  Substance and Sexual Activity   Alcohol use: Not Currently    Comment: 01/03/2018 "couple drinks/month"   Drug use: Not Currently    Types: Marijuana    Comment: VERY INTERMITTENT    Sexual activity: Not Currently  Other Topics Concern   Not on file  Social History Narrative   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      Home Situation: lives alone      Spiritual Beliefs: Christian - [employer]      Lifestyle: no regular exercise; poor diet      She is divorced at least one adult child   Never smoker    rare alcohol to occasional at best    no substance abuse         Social Determinants of Health   Financial Resource Strain: Low Risk  (07/15/2021)   Overall Financial Resource Strain (CARDIA)    Difficulty of Paying Living Expenses: Not hard at all  Food Insecurity: No Food Insecurity (06/07/2022)   Hunger Vital Sign    Worried About Running Out of Food in the Last Year: Never true    Ran Out of Food in the Last Year: Never true  Transportation Needs: No Transportation Needs (06/07/2022)   PRAPARE - Hydrologist (Medical): No    Lack of Transportation (Non-Medical): No  Physical Activity: Not on file  Stress: No Stress Concern Present (07/15/2021)   [place]    Feeling of Stress : Not at all  Social Connections: Moderately Isolated (07/15/2021)   Social Connection and Isolation Panel [NHANES]    Frequency of Communication with Friends and Family: More than three times a week     Frequency of Social Gatherings with Friends and Family: Three times a week    Attends Religious Services: More than 4 times per year    Active Member of Clubs or Organizations: No    Attends Archivist Meetings: Never    Marital Status: Divorced  Human resources officer Violence: Not At Risk (06/07/2022)   Humiliation, Afraid, Rape, and Kick questionnaire    Fear of Current or Ex-Partner: No    Emotionally Abused: No    Physically Abused: No    Sexually Abused: No    Ms. Burkes's family history includes Asthma in her father and sister; Heart attack in her father and mother; Lung cancer in her sister; Stroke in her brother.      Objective:    Vitals:  07/20/22 1106  BP: 98/60  Pulse: 85  SpO2: 97%    Physical Exam Well-developed well-nourished older African-American female in no acute distress.  Fatigued appearing , just came from dialysis   , Weight, 126 BMI 25.5  HEENT; nontraumatic normocephalic, EOMI, PE R LA, sclera anicteric. Oropharynx; not examined today Neck; supple, no JVD Cardiovascular; regular rate and rhythm with S1-S2, no murmur rub or gallop Pulmonary; Clear bilaterally Abdomen; soft, there is some mild tenderness in the mid abdomen no palpable hernia, no guarding or rebound nondistended, no palpable mass or hepatosplenomegaly, bowel sounds are active Rectal; not done today Skin; benign exam, no jaundice rash or appreciable lesions Extremities; no clubbing cyanosis or edema skin warm and dry Neuro/Psych; alert and oriented x4, grossly nonfocal mood and affect appropriate        Assessment & Plan:   #46 77 year old African-American female with history of recurrent anemia, higher iron deficiency, and melena with known history of small bowel AVMs.  She had recent hospitalization as outlined above after presenting with hemoglobin in the 5 range. She underwent enteroscopy with treatment of small bowel AVMs, then had capsule endoscopy which revealed 1  additional medium to large duodenal AVM nonbleeding and 1 large jejunal AVM.  She then underwent single balloon enteroscopy with treatment of these lesions with APC. Last hemoglobin on 07/11/2022 was 12.6/hematocrit 39.9  No further melena or hematochezia.  #2 chronic anticoagulation with Eliquis-history of atrial fibrillation/flutter  #3 diarrhea new over the past month-need to rule out infectious etiologies i.e. C. Difficile No new meds. No bowel edema noted on recent CT  #4  end stage renal disease-on dialysis #5.  History of compensated cirrhosis secondary to hep C-he just had CT of the abdomen pelvis with contrast, so that will suffice for [facility] screening.  Will need AFP at some point in the next few months.  #6 internal hernia noted on recent CT with loop of jejunum into the lesser sac, no evidence of obstruction, also with small left inguinal hernia #7 nonischemic cardiomyopathy status post ICD  Plan repeat CBC today, will need to continue to follow Stool for C. difficile PCR, stool culture, stool for lactoferrin We discussed use of OTC Imodium 1 tablet up to 4 times daily as needed, she has been using Pepto-Bismol which helps and that is okay to continue if she prefers. She will need AFP at next office visit, and pro time/INR Follow-up [name] in 4 to 6 weeks.  Turkessa Ostrom Genia Harold PA-C 07/20/2022   Cc: Mackie Pai, PA-C

## 2022-07-21 ENCOUNTER — Encounter (HOSPITAL_COMMUNITY)
Admission: RE | Disposition: A | Discharge status: Home / Self Care | Payer: Self-pay | Source: Ambulatory Visit | Attending: Internal Medicine

## 2022-07-21 ENCOUNTER — Ambulatory Visit (HOSPITAL_COMMUNITY)
Admission: RE | Admit: 2022-07-21 | Discharge: 2022-07-21 | Disposition: A | Discharge status: Home / Self Care | Payer: 59 | Source: Ambulatory Visit | Attending: Internal Medicine | Admitting: Internal Medicine

## 2022-07-21 ENCOUNTER — Other Ambulatory Visit: Payer: Self-pay

## 2022-07-21 DIAGNOSIS — I4719 Other supraventricular tachycardia: Secondary | ICD-10-CM | POA: Diagnosis not present

## 2022-07-21 DIAGNOSIS — E039 Hypothyroidism, unspecified: Secondary | ICD-10-CM | POA: Insufficient documentation

## 2022-07-21 DIAGNOSIS — I5022 Chronic systolic (congestive) heart failure: Secondary | ICD-10-CM | POA: Diagnosis not present

## 2022-07-21 DIAGNOSIS — Z4502 Encounter for adjustment and management of automatic implantable cardiac defibrillator: Secondary | ICD-10-CM | POA: Diagnosis not present

## 2022-07-21 DIAGNOSIS — Z992 Dependence on renal dialysis: Secondary | ICD-10-CM | POA: Diagnosis not present

## 2022-07-21 DIAGNOSIS — E1122 Type 2 diabetes mellitus with diabetic chronic kidney disease: Secondary | ICD-10-CM | POA: Insufficient documentation

## 2022-07-21 DIAGNOSIS — I4892 Unspecified atrial flutter: Secondary | ICD-10-CM | POA: Diagnosis not present

## 2022-07-21 DIAGNOSIS — I428 Other cardiomyopathies: Secondary | ICD-10-CM | POA: Diagnosis not present

## 2022-07-21 DIAGNOSIS — N186 End stage renal disease: Secondary | ICD-10-CM | POA: Insufficient documentation

## 2022-07-21 DIAGNOSIS — I132 Hypertensive heart and chronic kidney disease with heart failure and with stage 5 chronic kidney disease, or end stage renal disease: Secondary | ICD-10-CM | POA: Diagnosis not present

## 2022-07-21 HISTORY — PX: ICD GENERATOR CHANGEOUT: EP1231

## 2022-07-21 SURGERY — ICD GENERATOR CHANGEOUT

## 2022-07-21 MED ORDER — LIDOCAINE HCL (PF) 1 % IJ SOLN
INTRAMUSCULAR | Status: DC | PRN
  Administered 2022-07-21: 50 mL

## 2022-07-21 MED ORDER — CEFAZOLIN SODIUM-DEXTROSE 2-4 GM/100ML-% IV SOLN
2.0000 g | INTRAVENOUS | Status: AC
  Administered 2022-07-21: 2 g via INTRAVENOUS

## 2022-07-21 MED ORDER — SODIUM CHLORIDE 0.9 % IV SOLN
80.0000 mg | INTRAVENOUS | Status: AC
  Administered 2022-07-21: 80 mg

## 2022-07-21 MED ORDER — SODIUM CHLORIDE 0.9 % IV SOLN: INTRAVENOUS | Status: AC

## 2022-07-21 MED ORDER — POVIDONE-IODINE 10 % EX SWAB
2.0000 | Freq: Once | CUTANEOUS | Status: AC
  Administered 2022-07-21: 2 via TOPICAL

## 2022-07-21 MED ORDER — CHLORHEXIDINE GLUCONATE 4 % EX LIQD: 4.0000 | Freq: Once | CUTANEOUS | Status: DC

## 2022-07-21 MED ORDER — SODIUM CHLORIDE 0.9 % IV SOLN: INTRAVENOUS | Status: DC

## 2022-07-21 MED ORDER — CEFAZOLIN SODIUM-DEXTROSE 2-4 GM/100ML-% IV SOLN
INTRAVENOUS | Status: AC
  Filled 2022-07-21: qty 100

## 2022-07-21 MED ORDER — ACETAMINOPHEN 325 MG PO TABS: 325.0000 mg | ORAL_TABLET | ORAL | Status: DC | PRN

## 2022-07-21 MED ORDER — LIDOCAINE HCL 1 % IJ SOLN
INTRAMUSCULAR | Status: AC
  Filled 2022-07-21: qty 60

## 2022-07-21 MED ORDER — FENTANYL CITRATE (PF) 100 MCG/2ML IJ SOLN
INTRAMUSCULAR | Status: DC | PRN
  Administered 2022-07-21 (×2): 25 ug via INTRAVENOUS

## 2022-07-21 MED ORDER — FENTANYL CITRATE (PF) 100 MCG/2ML IJ SOLN
INTRAMUSCULAR | Status: AC
  Filled 2022-07-21: qty 2

## 2022-07-21 MED ORDER — SODIUM CHLORIDE 0.9 % IV SOLN
INTRAVENOUS | Status: AC
  Filled 2022-07-21: qty 2

## 2022-07-21 MED ORDER — MIDAZOLAM HCL 5 MG/5ML IJ SOLN
INTRAMUSCULAR | Status: AC
  Filled 2022-07-21: qty 5

## 2022-07-21 MED ORDER — LIDOCAINE HCL 1 % IJ SOLN
INTRAMUSCULAR | Status: AC
  Filled 2022-07-21: qty 20

## 2022-07-21 MED ORDER — MIDAZOLAM HCL 5 MG/5ML IJ SOLN
INTRAMUSCULAR | Status: DC | PRN
  Administered 2022-07-21 (×2): 1 mg via INTRAVENOUS

## 2022-07-21 SURGICAL SUPPLY — 9 items
CABLE SURGICAL S-101-97-12 (CABLE) ×1 IMPLANT
DEVICE DISSECT PLASMABLAD 3.0S (MISCELLANEOUS) IMPLANT
HEMOSTAT SURGICEL 2X4 FIBR (HEMOSTASIS) IMPLANT
ICD COBALT XT CRT DTPA2D1 (ICD Generator) IMPLANT
PAD DEFIB RADIO PHYSIO CONN (PAD) ×1 IMPLANT
PLASMABLADE 3.0S (MISCELLANEOUS) ×1
POUCH AIGIS-R ANTIBACT ICD (Mesh General) ×1 IMPLANT
POUCH AIGIS-R ANTIBACT ICD LRG (Mesh General) IMPLANT
TRAY PACEMAKER INSERTION (PACKS) ×1 IMPLANT

## 2022-07-21 NOTE — H&P (Signed)
Patient Care Team: Elise Benne as PCP - General (Internal Medicine) [name], MD as PCP - Electrophysiology (Cardiology) [name], MD as PCP - Cardiology (Cardiology) [name], MD as Consulting Physician (Nephrology) [name], MD as Consulting Physician (Cardiology) [name], MD as Consulting Physician (Gastroenterology) Center, [facility]   HPI  Morgan Clay is a 77 y.o. female ADMITTED for ICD-CRT generator change.   CRT-D implanted for nonischemic cardiomyopathy  2008 with generator change 2012 and again in May 2017; end-stage renal disease on hemodialysis   Hx of recurrent arrhythmi-atrial  seen by [facility] not thought candidate for ablation-- on amiodarone  Biggest complaint is fatigue, poor sleep  Also recurrent GI bleeding     Date Cr K Hgb LFTs TSH  1/19 1.76 4.3 14.7   0.33  4/19  1.96 4.2 9.7      4/23 5.2   13.3   40  3/24 9 3.9 13.5 20 11.6        DATE TEST EF    4/15 Echo 55-60 %    9/17 Echo 45-50 % LAE (52/3.1/35)  10/18 Echo  45-50% LVH mod  4/19 Echo  55-65%    2/23 Echo  55-60% LAE  3/23 TEE   AI mod-severe        Records and Results Reviewed   Past Medical History:  Diagnosis Date   Anemia    Angiectasia    Angiodysplasia of small intestine 06/10/2020   Ileum - seen on capsule endoscopy 05/2020 - ablated at colonoscopy   Aortic atherosclerosis ([place]) 08/30/2017   Arthritis    "qwhere" (01/03/2018)   Asthma    reports mild asthma since childhood - had COPD on dx list from prior PCP   Biventricular ICD (implantable cardioverter-defibrillator) in place 01/08/2007   Qualifier: Diagnosis of  By: Hassell Done FNP, [place]     Bleeding pseudoaneurysm of left brachiocephalic arteriovenous fistula ([place]) 09/24/2020   Chronic obstructive pulmonary disease ([place])    Chronic systolic congestive heart failure ([place])    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 (HCV) ([place])    HEPATITIS C -  s/p treatment with Harvoni, saw hepatology, Dawn Drazek   Diabetes [facility])    Diverticulitis of left colon status post robotic low anterior to sigmoid resection 05/22/2018 07/31/2017   Duodenal arteriovenous malformation    End stage renal disease ([place]) 03/22/2021   Esophageal varices (HCC)    Glaucoma    Gout    History of blood transfusion ~ 11/2017   Hypertension associated with diabetes ([place]) 06/07/2017   Hypothyroidism    Internal hemorrhoids    LBBB (left bundle branch block)    Malnutrition of moderate degree 07/09/2020   MDD (major depressive disorder), recurrent, in partial remission ([place]) 06/07/2017   OBSTRUCTIVE SLEEP APNEA 11/14/2007   no CPAP   OSTEOPENIA 09/30/2008   Persistent atrial fibrillation ([place]) 02/06/2020   Polyneuropathy associated with underlying disease ([place]) 06/09/2021   Pulmonary hypertension ([place]) on echocardiogram 01/14/2018   Secondary esophageal varices without bleeding ([place]) 12/23/2020   Formatting of this note might be different from the original. Patient had trace esophageal varices during small bowel enteroscopy in October 2021, follow-up EGD in January 2022 with no varices.  She previously had GI bleeding thought to be from AVM versus GAVE.  She is no longer on carvedilol and will need surveillance endoscopy.  Patient was previously followed at [facility] and I have asked her  Secondary hypercoagulable state ([place]) 02/06/2020   Type 2 diabetes, controlled, with renal manifestation ([place]) 11/24/2013    Past Surgical History:  Procedure Laterality Date   A/V FISTULAGRAM N/A 03/28/2021   Procedure: A/V FISTULAGRAM;  Surgeon: Waynetta Sandy, MD;  Location: [place] [facility];  Service: Cardiovascular;  Laterality: N/A;   APPENDECTOMY     AV FISTULA PLACEMENT Left 08/05/2020   Procedure: LEFT UPPER EXTREMITY ARTERIOVENOUS (AV) FISTULA CREATION;  Surgeon: Cherre Robins, MD;  Location: [employer];  Service: Vascular;  Laterality: Left;   BALLOON  ENTEROSCOPY N/A 06/21/2022   Procedure: BALLOON ENTEROSCOPY;  Surgeon: Lavena Bullion, DO;  [facility];  Service: Gastroenterology;  Laterality: N/A;   BASCILIC VEIN TRANSPOSITION Left 09/16/2020   Procedure: LEFT SECOND STAGE BASCILIC VEIN TRANSPOSITION;  Surgeon: Cherre Robins, MD;  Location: [place];  Service: Vascular;  Laterality: Left;  PERIPHERAL NERVE BLOCK   CARDIAC CATHETERIZATION     CARDIOVERSION N/A 12/14/2014   Procedure: CARDIOVERSION;  Surgeon: Dorothy Spark, MD;  Location: [place];  Service: Cardiovascular;  Laterality: N/A;   CARDIOVERSION N/A 02/26/2017   Procedure: CARDIOVERSION;  Surgeon: Evans Lance, MD;  Location: [place] [facility];  Service: Cardiovascular;  Laterality: N/A;   CARDIOVERSION N/A 07/24/2017   Procedure: CARDIOVERSION;  Surgeon: Acie Fredrickson Wonda Cheng, MD;  Location: [facility] ENDOSCOPY;  Service: Cardiovascular;  Laterality: N/A;   CARDIOVERSION N/A 02/12/2020   Procedure: CARDIOVERSION;  Surgeon: [name], MD;  Location: [facility] ENDOSCOPY;  Service: Cardiovascular;  Laterality: N/A;   CARDIOVERSION N/A 09/15/2021   Procedure: CARDIOVERSION;  Surgeon: [name], MD;  Location[facility];  Service: Cardiovascular;  Laterality: N/A;   CARPAL TUNNEL RELEASE  07/21/2021   COLONOSCOPY N/A 11/08/2017   Procedure: COLONOSCOPY;  Surgeon: Milus Banister, MD;  [facility];  Service: Endoscopy;  Laterality: N/A;   COLONOSCOPY WITH PROPOFOL N/A 05/31/2020   Procedure: COLONOSCOPY WITH PROPOFOL;  Surgeon: Irene Shipper, MD;  Location: [facility] ENDOSCOPY;  Service: Endoscopy;  Laterality: N/A;   COLONOSCOPY WITH PROPOFOL N/A 10/26/2021   Procedure: COLONOSCOPY WITH PROPOFOL;  Surgeon: Sharyn Creamer, MD;  Location: [place];  Service: Gastroenterology;  Laterality: N/A;   DILATION AND CURETTAGE OF UTERUS     ENTEROSCOPY N/A 03/02/2020   Procedure: ENTEROSCOPY;  Surgeon: Yetta Flock, MD;  Location: [facility] ENDOSCOPY;  Service:  Gastroenterology;  Laterality: N/A;   ENTEROSCOPY N/A 06/08/2022   Procedure: ENTEROSCOPY;  Surgeon: Jerene Bears, MD;  Location: [facility] ENDOSCOPY;  Service: Gastroenterology;  Laterality: N/A;   EP IMPLANTABLE DEVICE N/A 10/06/2015   Procedure: BIV ICD Generator Changeout;  Surgeon: [name], MD;  Location: [place] [facility];  Service: Cardiovascular;  Laterality: N/A;   ESOPHAGOGASTRODUODENOSCOPY N/A 10/26/2017   Procedure: ESOPHAGOGASTRODUODENOSCOPY (EGD);  Surgeon: Ladene Artist, MD;  Location: [facility];  Service: Endoscopy;  Laterality: N/A;   ESOPHAGOGASTRODUODENOSCOPY (EGD) WITH PROPOFOL N/A 11/07/2017   Procedure: ESOPHAGOGASTRODUODENOSCOPY (EGD) WITH PROPOFOL;  Surgeon: Milus Banister, MD;  [facility];  Service: Endoscopy;  Laterality: N/A;   ESOPHAGOGASTRODUODENOSCOPY (EGD) WITH PROPOFOL N/A 05/29/2020   Procedure: ESOPHAGOGASTRODUODENOSCOPY (EGD) WITH PROPOFOL;  Surgeon: Doran Stabler, MD;  Location: [place];  Service: Gastroenterology;  Laterality: N/A;   ESOPHAGOGASTRODUODENOSCOPY (EGD) WITH PROPOFOL N/A 10/26/2021   Procedure: ESOPHAGOGASTRODUODENOSCOPY (EGD) WITH PROPOFOL;  Surgeon: Sharyn Creamer, MD;  Location: [place];  Service: Gastroenterology;  Laterality: N/A;   ESOPHAGOGASTRODUODENOSCOPY (EGD) WITH PROPOFOL N/A 03/08/2022  Procedure: ESOPHAGOGASTRODUODENOSCOPY (EGD) WITH PROPOFOL;  Surgeon: Jackquline Denmark, MD;  Location: [place];  Service: Gastroenterology;  Laterality: N/A;   GIVENS CAPSULE STUDY N/A 05/19/2020   Procedure: GIVENS CAPSULE STUDY;  Surgeon: [name], MD;  Location: [place];  Service: Endoscopy;  Laterality: N/A;  .adm for obs since pacemaker, PA wil enter order and see pt   GIVENS CAPSULE STUDY N/A 05/29/2020   Procedure: GIVENS CAPSULE STUDY;  Surgeon: Doran Stabler, MD;  Location: [place];  Service: Gastroenterology;  Laterality: N/A;   GIVENS CAPSULE STUDY N/A 06/08/2022   Procedure: GIVENS  CAPSULE STUDY;  Surgeon: Jerene Bears, MD;  Location: [place];  Service: Gastroenterology;  Laterality: N/A;   HEMATOMA EVACUATION Left 09/24/2020   Procedure: EVACUATION HEMATOMA ARM;  Surgeon: Rosetta Posner, MD;  Location: [place];  Service: Vascular;  Laterality: Left;   HEMOSTASIS CLIP PLACEMENT  06/21/2022   Procedure: HEMOSTASIS CLIP PLACEMENT;  Surgeon: Lavena Bullion, DO;  [facility];  Service: Gastroenterology;;   HOT HEMOSTASIS N/A 10/26/2017   Procedure: HOT HEMOSTASIS (ARGON PLASMA COAGULATION/BICAP);  Surgeon: Ladene Artist, MD;  Location[facility];  Service: Endoscopy;  Laterality: N/A;   HOT HEMOSTASIS N/A 03/02/2020   Procedure: HOT HEMOSTASIS (ARGON PLASMA COAGULATION/BICAP);  Surgeon: Yetta Flock, MD;  Location: [facility]COPY;  Service: Gastroenterology;  Laterality: N/A;   HOT HEMOSTASIS N/A 05/31/2020   Procedure: HOT HEMOSTASIS (ARGON PLASMA COAGULATION/BICAP);  Surgeon: Irene Shipper, MD;  Location: [facility] ENDOSCOPY;  Service: Endoscopy;  Laterality: N/A;   HOT HEMOSTASIS N/A 03/08/2022   Procedure: HOT HEMOSTASIS (ARGON PLASMA COAGULATION/BICAP);  Surgeon: Jackquline Denmark, MD;  Location: [facility] ENDOSCOPY;  Service: Gastroenterology;  Laterality: N/A;   HOT HEMOSTASIS N/A 06/08/2022   Procedure: HOT HEMOSTASIS (ARGON PLASMA COAGULATION/BICAP);  Surgeon: Jerene Bears, MD;  Location: [facility] ENDOSCOPY;  Service: Gastroenterology;  Laterality: N/A;   HOT HEMOSTASIS N/A 06/21/2022   Procedure: HOT HEMOSTASIS (ARGON PLASMA COAGULATION/BICAP);  Surgeon: Lavena Bullion, DO;  [facility];  Service: Gastroenterology;  Laterality: N/A;   INSERT / REPLACE / REMOVE PACEMAKER  ?2008   IR PERC TUN PERIT CATH WO PORT S&I /IMAG  07/05/2020   PERIPHERAL VASCULAR INTERVENTION Left 03/28/2021   Procedure: PERIPHERAL VASCULAR INTERVENTION;  Surgeon: Waynetta Sandy, MD;  Location: [place] [facility];  Service: Cardiovascular;  Laterality: Left;    POLYPECTOMY  11/08/2017   Procedure: POLYPECTOMY;  Surgeon: Milus Banister, MD;  [facility];  Service: Endoscopy;;   PROCTOSCOPY N/A 05/22/2018   Procedure: RIGID PROCTOSCOPY;  Surgeon: Michael Boston, MD;  [facility];  Service: General;  Laterality: N/A;   RIGHT HEART CATH N/A 02/13/2018   Procedure: RIGHT HEART CATH;  Surgeon: [name], MD;  Location: [place] [facility];  Service: Cardiovascular;  Laterality: N/A;   RIGHT HEART CATH N/A 06/23/2020   Procedure: RIGHT HEART CATH;  Surgeon: [name], MD;  Location: [place] [facility];  Service: Cardiovascular;  Laterality: N/A;   RIGHT/LEFT HEART CATH AND CORONARY ANGIOGRAPHY N/A 12/11/2019   Procedure: RIGHT/LEFT HEART CATH AND CORONARY ANGIOGRAPHY;  Surgeon: [name], MD;  Location: [place] [facility];  Service: Cardiovascular;  Laterality: N/A;   SUBMUCOSAL TATTOO INJECTION  03/02/2020   Procedure: SUBMUCOSAL TATTOO INJECTION;  Surgeon: Yetta Flock, MD;  Location: [facility] ENDOSCOPY;  Service: Gastroenterology;;   SUBMUCOSAL TATTOO INJECTION  05/31/2020   Procedure: SUBMUCOSAL TATTOO INJECTION;  Surgeon: Irene Shipper,  MD;  Location: [place];  Service: Endoscopy;;   SUBMUCOSAL TATTOO INJECTION  06/21/2022   Procedure: SUBMUCOSAL TATTOO INJECTION;  Surgeon: Lavena Bullion, DO;  [facility];  Service: Gastroenterology;;   TEE WITHOUT CARDIOVERSION N/A 08/02/2021   Procedure: TRANSESOPHAGEAL ECHOCARDIOGRAM (TEE);  Surgeon: [name], MD;  Location: [facility] ENDOSCOPY;  Service: Cardiovascular;  Laterality: N/A;   TUBAL LIGATION     XI ROBOTIC ASSISTED LOWER ANTERIOR RESECTION N/A 05/22/2018   Procedure: XI ROBOTIC ASSISTED SIGMOID COLOECTOMY MOBILIZATION OF SPLENIC FLEXURE, FIREFLY ASSESSMENT OF PERFUSION;  Surgeon: Michael Boston, MD;  [facility];  Service: General;  Laterality: N/A;  ERAS PATHWAY    Current Facility-Administered Medications  Medication Dose Route  Frequency Provider Last Rate Last Admin   0.9 %  sodium chloride infusion   Intravenous Continuous [name], MD 50 mL/hr at 07/21/22 0835 New Bag at 07/21/22 0835   ceFAZolin (ANCEF) IVPB 2g/100 mL premix  2 g Intravenous On Call [name], MD       chlorhexidine (HIBICLENS) 4 % liquid 4 Application  4 Application Topical Once [name], MD       gentamicin (GARAMYCIN) 80 mg in sodium chloride 0.9 % 500 mL irrigation  80 mg Irrigation On Call [name], MD        No Known Allergies       Current Meds  Medication Sig   amiodarone (PACERONE) 200 MG tablet Take 1 tablet (200 mg total) by mouth daily.   apixaban (ELIQUIS) 2.5 MG TABS tablet Take 1 tablet (2.5 mg total) by mouth 2 (two) times daily.   gabapentin (NEURONTIN) 300 MG capsule Take 1 capsule (300 mg total) by mouth daily. Fill one rx. Computer may have sent over 2 (Patient taking differently: Take 300 mg by mouth daily.)   levothyroxine (SYNTHROID) 125 MCG tablet Take 1 tablet (125 mcg total) by mouth daily before breakfast.   lidocaine-prilocaine (EMLA) cream Apply 1 Application topically as needed.   nitroGLYCERIN (NITROSTAT) 0.4 MG SL tablet Place 1 tablet (0.4 mg total) under the tongue every 5 (five) minutes as needed for chest pain.     Review of Systems negative except from HPI and PMH  Physical Exam BP (!) 136/57   Pulse 71   Temp 97.7 F (36.5 C)   Resp 19   Ht 4\' 11"  (1.499 m)   Wt 58.5 kg   SpO2 98%   BMI 26.05 kg/m  Well developed and well nourished in no acute distress HENT normal E scleral and icterus clear Neck Supple JVP flat; carotids brisk and full Clear to ausculation Regular rate and rhythm, no murmurs gallops or rub Soft with active bowel sounds No clubbing cyanosis  Edema Alert and oriented, grossly normal motor and sensory function Skin Warm and Dry    Assessment and  Plan DiastolicSystolic heart failure chronic   Nonischemic cardiomyopathy   High Risk  Medication Surveillance-amiodarone   Hypertension     CRT-D- [employer]      Atrial Flutter/atrial tachycardia   Hypothyroidism    Device at ERI  with interval and sustained improvement in LVfunction   broached the topic today of inactivetion or nonreplacing of HV therapies-- we will replace HV   We have reviewed the benefits and risks of generator replacement.  These include but are not limited to lead fracture and infection.  The patient understands, agrees and is willing to proceed.    Will use antimicrobial pouch and  avoid anticoagulation with HD for the next two treatments

## 2022-07-21 NOTE — Discharge Instructions (Signed)
Implantable Cardiac Device Battery Change, Care After  This sheet gives you information about how to care for yourself after your procedure. Your health care provider may also give you more specific instructions. If you have problems or questions, contact your health care provider. What can I expect after the procedure? After your procedure, it is common to have: Pain or soreness at the site where the cardiac device was inserted. Swelling at the site where the cardiac device was inserted. You should received an information card for your new device in 4-8 weeks. Follow these instructions at home: Incision care  Keep the incision clean and dry. Do not take baths, swim, or use a hot tub until after your wound check.  Do not shower for at least 7 days, or as directed by your health care provider. Pat the area dry with a clean towel. Do not rub the area. This may cause bleeding. Follow instructions from your health care provider about how to take care of your incision. Make sure you: Leave stitches (sutures), skin glue, or adhesive strips in place. These skin closures may need to stay in place for 2 weeks or longer. If adhesive strip edges start to loosen and curl up, you may trim the loose edges. Do not remove adhesive strips completely unless your health care provider tells you to do that. Check your incision area every day for signs of infection. Check for: More redness, swelling, or pain. More fluid or blood. Warmth. Pus or a bad smell. Activity Do not lift anything that is heavier than 10 lb (4.5 kg) until your health care provider says it is okay to do so. For the first week, or as long as told by your health care provider: Avoid lifting your affected arm higher than your shoulder. After 1 week, Be gentle when you move your arms over your head. It is okay to raise your arm to comb your hair. Avoid strenuous exercise. Ask your health care provider when it is okay to: Resume your normal  activities. Return to work or school. Resume sexual activity. Eating and drinking Eat a heart-healthy diet. This should include plenty of fresh fruits and vegetables, whole grains, low-fat dairy products, and lean protein like chicken and fish. Limit alcohol intake to no more than 1 drink a day for non-pregnant women and 2 drinks a day for men. One drink equals 12 oz of beer, 5 oz of wine, or 1 oz of hard liquor. Check ingredients and nutrition facts on packaged foods and beverages. Avoid the following types of food: Food that is high in salt (sodium). Food that is high in saturated fat, like full-fat dairy or red meat. Food that is high in trans fat, like fried food. Food and drinks that are high in sugar. Lifestyle Do not use any products that contain nicotine or tobacco, such as cigarettes and e-cigarettes. If you need help quitting, ask your health care provider. Take steps to manage and control your weight. Once cleared, get regular exercise. Aim for 150 minutes of moderate-intensity exercise (such as walking or yoga) or 75 minutes of vigorous exercise (such as running or swimming) each week. Manage other health problems, such as diabetes or high blood pressure. Ask your health care provider how you can manage these conditions. General instructions Do not drive for 24 hours after your procedure if you were given a medicine to help you relax (sedative). Take over-the-counter and prescription medicines only as told by your health care provider. Avoid putting pressure   on the area where the cardiac device was placed. If you need an MRI after your cardiac device has been placed, be sure to tell the health care provider who orders the MRI that you have a cardiac device. Avoid close and prolonged exposure to electrical devices that have strong magnetic fields. These include: Cell phones. Avoid keeping them in a pocket near the cardiac device, and try using the ear opposite the cardiac  device. MP3 players. Household appliances, like microwaves. Metal detectors. Electric generators. High-tension wires. Keep all follow-up visits as directed by your health care provider. This is important. Contact a health care provider if: You have pain at the incision site that is not relieved by over-the-counter or prescription medicines. You have any of these around your incision site or coming from it: More redness, swelling, or pain. Fluid or blood. Warmth to the touch. Pus or a bad smell. You have a fever. You feel brief, occasional palpitations, light-headedness, or any symptoms that you think might be related to your heart. Get help right away if: You experience chest pain that is different from the pain at the cardiac device site. You develop a red streak that extends above or below the incision site. You experience shortness of breath. You have palpitations or an irregular heartbeat. You have light-headedness that does not go away quickly. You faint or have dizzy spells. Your pulse suddenly drops or increases rapidly and does not return to normal. You begin to gain weight and your legs and ankles swell. Summary After your procedure, it is common to have pain, soreness, and some swelling where the cardiac device was inserted. Make sure to keep your incision clean and dry. Follow instructions from your health care provider about how to take care of your incision. Check your incision every day for signs of infection, such as more pain or swelling, pus or a bad smell, warmth, or leaking fluid and blood. Avoid strenuous exercise and lifting your left arm higher than your shoulder for 2 weeks, or as long as told by your health care provider. This information is not intended to replace advice given to you by your health care provider. Make sure you discuss any questions you have with your health care provider. 

## 2022-07-24 ENCOUNTER — Encounter (HOSPITAL_COMMUNITY): Payer: Self-pay | Admitting: Internal Medicine

## 2022-07-24 DIAGNOSIS — D631 Anemia in chronic kidney disease: Secondary | ICD-10-CM | POA: Diagnosis not present

## 2022-07-24 DIAGNOSIS — E119 Type 2 diabetes mellitus without complications: Secondary | ICD-10-CM | POA: Diagnosis not present

## 2022-07-24 DIAGNOSIS — Z992 Dependence on renal dialysis: Secondary | ICD-10-CM | POA: Diagnosis not present

## 2022-07-24 DIAGNOSIS — N2581 Secondary hyperparathyroidism of renal origin: Secondary | ICD-10-CM | POA: Diagnosis not present

## 2022-07-24 DIAGNOSIS — N186 End stage renal disease: Secondary | ICD-10-CM | POA: Diagnosis not present

## 2022-07-24 DIAGNOSIS — D509 Iron deficiency anemia, unspecified: Secondary | ICD-10-CM | POA: Diagnosis not present

## 2022-07-24 DIAGNOSIS — L299 Pruritus, unspecified: Secondary | ICD-10-CM | POA: Diagnosis not present

## 2022-07-26 DIAGNOSIS — N2581 Secondary hyperparathyroidism of renal origin: Secondary | ICD-10-CM | POA: Diagnosis not present

## 2022-07-26 DIAGNOSIS — L299 Pruritus, unspecified: Secondary | ICD-10-CM | POA: Diagnosis not present

## 2022-07-26 DIAGNOSIS — E119 Type 2 diabetes mellitus without complications: Secondary | ICD-10-CM | POA: Diagnosis not present

## 2022-07-26 DIAGNOSIS — D509 Iron deficiency anemia, unspecified: Secondary | ICD-10-CM | POA: Diagnosis not present

## 2022-07-26 DIAGNOSIS — Z992 Dependence on renal dialysis: Secondary | ICD-10-CM | POA: Diagnosis not present

## 2022-07-26 DIAGNOSIS — N186 End stage renal disease: Secondary | ICD-10-CM | POA: Diagnosis not present

## 2022-07-26 DIAGNOSIS — D631 Anemia in chronic kidney disease: Secondary | ICD-10-CM | POA: Diagnosis not present

## 2022-07-27 ENCOUNTER — Ambulatory Visit (INDEPENDENT_AMBULATORY_CARE_PROVIDER_SITE_OTHER): Payer: 59 | Admitting: Medical

## 2022-07-27 VITALS — BP 130/96 | HR 78 | Temp 98.0°F | Resp 18 | Ht 59.0 in | Wt 129.0 lb

## 2022-07-27 DIAGNOSIS — D649 Anemia, unspecified: Secondary | ICD-10-CM

## 2022-07-27 DIAGNOSIS — G4700 Insomnia, unspecified: Secondary | ICD-10-CM

## 2022-07-27 DIAGNOSIS — I517 Cardiomegaly: Secondary | ICD-10-CM

## 2022-07-27 DIAGNOSIS — R5383 Other fatigue: Secondary | ICD-10-CM

## 2022-07-27 DIAGNOSIS — N186 End stage renal disease: Secondary | ICD-10-CM

## 2022-07-27 LAB — CBC WITH DIFFERENTIAL/PLATELET
Basophils Absolute: 0 10*3/uL (ref 0.0–0.1)
Basophils Relative: 0.6 % (ref 0.0–3.0)
Eosinophils Absolute: 0.2 10*3/uL (ref 0.0–0.7)
Eosinophils Relative: 4.4 % (ref 0.0–5.0)
HCT: 40.2 % (ref 36.0–46.0)
Hemoglobin: 12.7 g/dL (ref 12.0–15.0)
Lymphocytes Relative: 24.5 % (ref 12.0–46.0)
Lymphs Abs: 1.2 10*3/uL (ref 0.7–4.0)
MCHC: 31.5 g/dL (ref 30.0–36.0)
MCV: 79.8 fl (ref 78.0–100.0)
Monocytes Absolute: 0.5 10*3/uL (ref 0.1–1.0)
Monocytes Relative: 9.8 % (ref 3.0–12.0)
Neutro Abs: 2.9 10*3/uL (ref 1.4–7.7)
Neutrophils Relative %: 60.7 % (ref 43.0–77.0)
Platelets: 173 10*3/uL (ref 150.0–400.0)
RBC: 5.04 Mil/uL (ref 3.87–5.11)
RDW: 21.1 % — ABNORMAL HIGH (ref 11.5–15.5)
WBC: 4.8 10*3/uL (ref 4.0–10.5)

## 2022-07-27 LAB — VITAMIN B12: Vitamin B-12: 537 pg/mL (ref 211–911)

## 2022-07-27 NOTE — Progress Notes (Signed)
Subjective:    Patient ID: Morgan Clay, female    DOB: 1945-10-28, 77 y.o.   MRN: [id number]  HPI  Pt has seen cardilogist and GI MD just recently.  "A/P 1. NICM, recovered, now with primarily diastolic CHF:  Echo in 0000000 showed EF improved to 60-65% with MDT BiV pacing. Echo in 10/19 showed stable EF 55-60% with mild-moderate MR, moderate AI. PYP scan in 3/20 was equivocal for transthyretin amyloidosis, repeat PYP scan in 1/22 was probably negative for TTR cardiac amyloidosis.  Echo in 9/20 showed EF back down to 45-50%. LHC/RHC in 8/21 showed nonobstructive CAD, normal filling pressures and cardiac output. Last echo 2/23 showed EF 55-60%, moderate LVH, moderate RV enlargement with mildly decreased RV systolic function, moderate TR, ? moderate-severe AI.  TEE (3/23) EF 60-65%, moderate LVH, RV ok, moderate to severe MR, moderate to severe AI. Echo 8/23 EF 50-55%, with moderate RV dilation, moderate AI. Volume now managed by HD. Chronic NYHA class III symptoms.  She is not volume overloaded by exam or Optivol.  - HD for volume management. - Off Coreg and Bidil with low BP at HD.  - She will be having her device generator changed soon.  2. Atrial fibrillation/atrial flutter/atrial tachycardia: Paroxysmal.  She has had multiple GI bleeds, and she was deemed a poor Watchman candidate due to comorbidities by Dr. Quentin Ore.  Currently no evidence for overt bleeding.  Seen by Dr. Curt Bears, not thought to be candidate for atrial tachycardia ablation due to high risk.  She is in NSR today.  - Continue Eliquis 2.5 mg bid. CBC today.  - Continue amiodarone, check TSH and LFTs.  She needs regular eye exam.  3. ESRD: Now on HD M-W-F 4. Anemia, chronic blood loss: Long history multiple bleeding events while on Eliquis from GI AVMs.  No overt bleeding currently. - CBC today.  5. Cirrhosis: S/p treatment with Harvoni for Hep C, followed by GI outpatient.     6. Aortic insufficiency:  TEE 3/23 showed  moderate to severe AI (PHT 413 msec and vena contracta 0.49 cm).  Cannot do cardiac MRI as ICD not compatible. Repeat echo 8/23 showed moderate AI. She would likely be a TAVR candidate.  - I will repeat echo at followup in 6 months."   GI MD A/P   " #86 77 year old African-American female with history of recurrent anemia, higher iron deficiency, and melena with known history of small bowel AVMs.  She had recent hospitalization as outlined above after presenting with hemoglobin in the 5 range. She underwent enteroscopy with treatment of small bowel AVMs, then had capsule endoscopy which revealed 1 additional medium to large duodenal AVM nonbleeding and 1 large jejunal AVM.  She then underwent single balloon enteroscopy with treatment of these lesions with APC. Last hemoglobin on 07/11/2022 was 12.6/hematocrit 39.9   No further melena or hematochezia.   #2 chronic anticoagulation with Eliquis-history of atrial fibrillation/flutter   #3 diarrhea new over the past month-need to rule out infectious etiologies i.e. C. Difficile No new meds. No bowel edema noted on recent CT   #4  end stage renal disease-on dialysis #5.  History of compensated cirrhosis secondary to hep C-he just had CT of the abdomen pelvis with contrast, so that will suffice for Morgan Clay screening.  Will need AFP at some point in the next few months.   #6 internal hernia noted on recent CT with loop of jejunum into the lesser sac, no evidence of obstruction, also with small  left inguinal hernia #7 nonischemic cardiomyopathy status post ICD   Plan repeat CBC today, will need to continue to follow Stool for C. difficile PCR, stool culture, stool for lactoferrin We discussed use of OTC Imodium 1 tablet up to 4 times daily as needed, she has been using Pepto-Bismol which helps and that is okay to continue if she prefers. She will need AFP at next office visit, and pro time/INR Follow-up [name] in 4 to 6 weeks."  Pt has no cardiac  signs/symptoms today. No swelling of legs. No sob. No chest pain. No palpitations/  No black or bloody stools. She states former diarrhea resolved.  Pt still attending dialysis.  Pt lab 1 week ago showed no anemia.       Review of Systems  Constitutional:  Positive for fatigue.  Respiratory:  Negative for cough, chest tightness, shortness of breath and wheezing.   Cardiovascular:  Negative for chest pain and palpitations.  Gastrointestinal:  Negative for abdominal pain.  Musculoskeletal:  Negative for back pain.  Skin:  Negative for rash.  Neurological:  Negative for dizziness and numbness.  Hematological:  Negative for adenopathy. Does not bruise/bleed easily.  Psychiatric/Behavioral:  Positive for sleep disturbance. Negative for behavioral problems, confusion and suicidal ideas. The patient is not nervous/anxious.    Past Medical History:  Diagnosis Date   Anemia    Angiectasia    Angiodysplasia of small intestine 06/10/2020   Ileum - seen on capsule endoscopy 05/2020 - ablated at colonoscopy   Aortic atherosclerosis ([place]) 08/30/2017   Arthritis    "qwhere" (01/03/2018)   Asthma    reports mild asthma since childhood - had COPD on dx list from prior PCP   Biventricular ICD (implantable cardioverter-defibrillator) in place 01/08/2007   Qualifier: Diagnosis of  By: Hassell Done FNP, [place]     Bleeding pseudoaneurysm of left brachiocephalic arteriovenous fistula ([place]) 09/24/2020   Chronic obstructive pulmonary disease ([place])    Chronic systolic congestive heart failure ([place])    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 (HCV) ([place])    HEPATITIS C - s/p treatment with Harvoni, saw hepatology, Dawn Drazek   Diabetes [facility])    Diverticulitis of left colon status post robotic low anterior to sigmoid resection 05/22/2018 07/31/2017   Duodenal arteriovenous malformation    End stage renal disease ([place]) 03/22/2021   Esophageal varices (HCC)    Glaucoma    Gout    History of  blood transfusion ~ 11/2017   Hypertension associated with diabetes ([place]) 06/07/2017   Hypothyroidism    Internal hemorrhoids    LBBB (left bundle branch block)    Malnutrition of moderate degree 07/09/2020   MDD (major depressive disorder), recurrent, in partial remission ([place]) 06/07/2017   OBSTRUCTIVE SLEEP APNEA 11/14/2007   no CPAP   OSTEOPENIA 09/30/2008   Persistent atrial fibrillation ([place]) 02/06/2020   Polyneuropathy associated with underlying disease ([place]) 06/09/2021   Pulmonary hypertension ([place]) on echocardiogram 01/14/2018   Secondary esophageal varices without bleeding ([place]) 12/23/2020   Formatting of this note might be different from the original. Patient had trace esophageal varices during small bowel enteroscopy in October 2021, follow-up EGD in January 2022 with no varices.  She previously had GI bleeding thought to be from AVM versus GAVE.  She is no longer on carvedilol and will need surveillance endoscopy.  Patient was previously followed at [place] and I have asked her    Secondary hypercoagulable state ([place]) 02/06/2020   Type 2 diabetes, controlled, with  renal manifestation ([place]) 11/24/2013     Social History   Socioeconomic History   Marital status: Divorced    Spouse name: Not on file   Number of children: 1   Years of education: 12   Highest education level: Not on file  Occupational History   Not on file  Tobacco Use   Smoking status: Never   Smokeless tobacco: Never  Vaping Use   Vaping Use: Never used  Substance and Sexual Activity   Alcohol use: Not Currently    Comment: 01/03/2018 "couple drinks/month"   Drug use: Not Currently    Types: Marijuana    Comment: VERY INTERMITTENT    Sexual activity: Not Currently  Other Topics Concern   Not on file  Social History Narrative   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      Home Situation: lives alone      Spiritual Beliefs: Christian - [employer]      Lifestyle: no regular exercise; poor diet       She is divorced at least one adult child   Never smoker    rare alcohol to occasional at best    no substance abuse         Social Determinants of Health   Financial Resource Strain: Low Risk  (07/15/2021)   Overall Financial Resource Strain (CARDIA)    Difficulty of Paying Living Expenses: Not hard at all  Food Insecurity: No Food Insecurity (06/07/2022)   Hunger Vital Sign    Worried About Running Out of Food in the Last Year: Never true    [place] in the Last Year: Never true  Transportation Needs: No Transportation Needs (06/07/2022)   PRAPARE - Hydrologist (Medical): No    Lack of Transportation (Non-Medical): No  Physical Activity: Not on file  Stress: No Stress Concern Present (07/15/2021)   [place]    Feeling of Stress : Not at all  Social Connections: Moderately Isolated (07/15/2021)   Social Connection and Isolation Panel [NHANES]    Frequency of Communication with Friends and Family: More than three times a week    Frequency of Social Gatherings with Friends and Family: Three times a week    Attends Religious Services: More than 4 times per year    Active Member of Clubs or Organizations: No    Attends Archivist Meetings: Never    Marital Status: Divorced  Human resources officer Violence: Not At Risk (06/07/2022)   Humiliation, Afraid, Rape, and Kick questionnaire    Fear of Current or Ex-Partner: No    Emotionally Abused: No    Physically Abused: No    Sexually Abused: No    Past Surgical History:  Procedure Laterality Date   A/V FISTULAGRAM N/A 03/28/2021   Procedure: A/V FISTULAGRAM;  Surgeon: Waynetta Sandy, MD;  Location: [place] [facility];  Service: Cardiovascular;  Laterality: N/A;   APPENDECTOMY     AV FISTULA PLACEMENT Left 08/05/2020   Procedure: LEFT UPPER EXTREMITY ARTERIOVENOUS (AV) FISTULA CREATION;  Surgeon: Cherre Robins, MD;  Location: [place];  Service: Vascular;  Laterality: Left;   BALLOON ENTEROSCOPY N/A 06/21/2022   Procedure: BALLOON ENTEROSCOPY;  Surgeon: Lavena Bullion, DO;  [facility];  Service: Gastroenterology;  Laterality: N/A;   BASCILIC VEIN TRANSPOSITION Left 09/16/2020   Procedure: LEFT SECOND STAGE BASCILIC VEIN TRANSPOSITION;  Surgeon: Cherre Robins, MD;  Location:  [place] [facility];  Service: Vascular;  Laterality: Left;  PERIPHERAL NERVE BLOCK   CARDIAC CATHETERIZATION     CARDIOVERSION N/A 12/14/2014   Procedure: CARDIOVERSION;  Surgeon: Dorothy Spark, MD;  Location: [place];  Service: Cardiovascular;  Laterality: N/A;   CARDIOVERSION N/A 02/26/2017   Procedure: CARDIOVERSION;  Surgeon: Evans Lance, MD;  Location: [place] [facility];  Service: Cardiovascular;  Laterality: N/A;   CARDIOVERSION N/A 07/24/2017   Procedure: CARDIOVERSION;  Surgeon: Acie Fredrickson Wonda Cheng, MD;  Location[facility];  Service: Cardiovascular;  Laterality: N/A;   CARDIOVERSION N/A 02/12/2020   Procedure: CARDIOVERSION;  Surgeon: [name], MD;  Location: [facility] ENDOSCOPY;  Service: Cardiovascular;  Laterality: N/A;   CARDIOVERSION N/A 09/15/2021   Procedure: CARDIOVERSION;  Surgeon: [name], MD;  Location: [facility] ENDOSCOPY;  Service: Cardiovascular;  Laterality: N/A;   CARPAL TUNNEL RELEASE  07/21/2021   COLONOSCOPY N/A 11/08/2017   Procedure: COLONOSCOPY;  Surgeon: Milus Banister, MD;  [facility];  Service: Endoscopy;  Laterality: N/A;   COLONOSCOPY WITH PROPOFOL N/A 05/31/2020   Procedure: COLONOSCOPY WITH PROPOFOL;  Surgeon: Irene Shipper, MD;  Location: [facility] ENDOSCOPY;  Service: Endoscopy;  Laterality: N/A;   COLONOSCOPY WITH PROPOFOL N/A 10/26/2021   Procedure: COLONOSCOPY WITH PROPOFOL;  Surgeon: Sharyn Creamer, MD;  Location: [place];  Service: Gastroenterology;  Laterality: N/A;   DILATION AND CURETTAGE OF UTERUS     ENTEROSCOPY N/A 03/02/2020   Procedure:  ENTEROSCOPY;  Surgeon: Yetta Flock, MD;  Location: [facility] ENDOSCOPY;  Service: Gastroenterology;  Laterality: N/A;   ENTEROSCOPY N/A 06/08/2022   Procedure: ENTEROSCOPY;  Surgeon: Jerene Bears, MD;  Location: [facility] ENDOSCOPY;  Service: Gastroenterology;  Laterality: N/A;   EP IMPLANTABLE DEVICE N/A 10/06/2015   Procedure: BIV ICD Generator Changeout;  Surgeon: [name], MD;  Location: [place] [facility];  Service: Cardiovascular;  Laterality: N/A;   ESOPHAGOGASTRODUODENOSCOPY N/A 10/26/2017   Procedure: ESOPHAGOGASTRODUODENOSCOPY (EGD);  Surgeon: Ladene Artist, MD;  [facility];  Service: Endoscopy;  Laterality: N/A;   ESOPHAGOGASTRODUODENOSCOPY (EGD) WITH PROPOFOL N/A 11/07/2017   Procedure: ESOPHAGOGASTRODUODENOSCOPY (EGD) WITH PROPOFOL;  Surgeon: Milus Banister, MD;  [facility];  Service: Endoscopy;  Laterality: N/A;   ESOPHAGOGASTRODUODENOSCOPY (EGD) WITH PROPOFOL N/A 05/29/2020   Procedure: ESOPHAGOGASTRODUODENOSCOPY (EGD) WITH PROPOFOL;  Surgeon: Doran Stabler, MD;  Location: [place];  Service: Gastroenterology;  Laterality: N/A;   ESOPHAGOGASTRODUODENOSCOPY (EGD) WITH PROPOFOL N/A 10/26/2021   Procedure: ESOPHAGOGASTRODUODENOSCOPY (EGD) WITH PROPOFOL;  Surgeon: Sharyn Creamer, MD;  Location: [place];  Service: Gastroenterology;  Laterality: N/A;   ESOPHAGOGASTRODUODENOSCOPY (EGD) WITH PROPOFOL N/A 03/08/2022   Procedure: ESOPHAGOGASTRODUODENOSCOPY (EGD) WITH PROPOFOL;  Surgeon: Jackquline Denmark, MD;  Location: [place];  Service: Gastroenterology;  Laterality: N/A;   GIVENS CAPSULE STUDY N/A 05/19/2020   Procedure: GIVENS CAPSULE STUDY;  Surgeon: [name], MD;  Location: [place];  Service: Endoscopy;  Laterality: N/A;  .adm for obs since pacemaker, PA wil enter order and see pt   GIVENS CAPSULE STUDY N/A 05/29/2020   Procedure: GIVENS CAPSULE STUDY;  Surgeon: Doran Stabler, MD;  Location: [place];  Service:  Gastroenterology;  Laterality: N/A;   GIVENS CAPSULE STUDY N/A 06/08/2022   Procedure: GIVENS CAPSULE STUDY;  Surgeon: Jerene Bears, MD;  Location: [place];  Service: Gastroenterology;  Laterality: N/A;   HEMATOMA EVACUATION Left 09/24/2020   Procedure: EVACUATION HEMATOMA ARM;  Surgeon: Rosetta Posner, MD;  Location: [facility] [facility];  Service: Vascular;  Laterality: Left;   HEMOSTASIS CLIP PLACEMENT  06/21/2022   Procedure: HEMOSTASIS CLIP PLACEMENT;  Surgeon: Lavena Bullion, DO;  [facility];  Service: Gastroenterology;;   HOT HEMOSTASIS N/A 10/26/2017   Procedure: HOT HEMOSTASIS (ARGON PLASMA COAGULATION/BICAP);  Surgeon: Ladene Artist, MD;  [facility];  Service: Endoscopy;  Laterality: N/A;   HOT HEMOSTASIS N/A 03/02/2020   Procedure: HOT HEMOSTASIS (ARGON PLASMA COAGULATION/BICAP);  Surgeon: Yetta Flock, MD;  Location: [facility] ENDOSCOPY;  Service: Gastroenterology;  Laterality: N/A;   HOT HEMOSTASIS N/A 05/31/2020   Procedure: HOT HEMOSTASIS (ARGON PLASMA COAGULATION/BICAP);  Surgeon: Irene Shipper, MD;  Location: [facility] ENDOSCOPY;  Service: Endoscopy;  Laterality: N/A;   HOT HEMOSTASIS N/A 03/08/2022   Procedure: HOT HEMOSTASIS (ARGON PLASMA COAGULATION/BICAP);  Surgeon: Jackquline Denmark, MD;  [facility];  Service: Gastroenterology;  Laterality: N/A;   HOT HEMOSTASIS N/A 06/08/2022   Procedure: HOT HEMOSTASIS (ARGON PLASMA COAGULATION/BICAP);  Surgeon: Jerene Bears, MD;  Location: [facility] ENDOS[facility];  Service: Gastroenterology;  Laterality: N/A;   HOT HEMOSTASIS N/A 06/21/2022   Procedure: HOT HEMOSTASIS (ARGON PLASMA COAGULATION/BICAP);  Surgeon: Lavena Bullion, DO;  [facility];  Service: Gastroenterology;  Laterality: N/A;   ICD GENERATOR CHANGEOUT N/A 07/21/2022   Procedure: ICD GENERATOR CHANGEOUT;  Surgeon: [name], MD;  Location: [place] [facility];  Service: Cardiovascular;  Laterality: N/A;   INSERT / REPLACE / REMOVE PACEMAKER  ?2008    IR PERC TUN PERIT CATH WO PORT S&I /IMAG  07/05/2020   PERIPHERAL VASCULAR INTERVENTION Left 03/28/2021   Procedure: PERIPHERAL VASCULAR INTERVENTION;  Surgeon: Waynetta Sandy, MD;  Location: [place] [facility];  Service: Cardiovascular;  Laterality: Left;   POLYPECTOMY  11/08/2017   Procedure: POLYPECTOMY;  Surgeon: Milus Banister, MD;  [facility];  Service: Endoscopy;;   PROCTOSCOPY N/A 05/22/2018   Procedure: RIGID PROCTOSCOPY;  Surgeon: Michael Boston, MD;  [facility];  Service: General;  Laterality: N/A;   RIGHT HEART CATH N/A 02/13/2018   Procedure: RIGHT HEART CATH;  Surgeon: [name], MD;  Location: [place] [facility];  Service: Cardiovascular;  Laterality: N/A;   RIGHT HEART CATH N/A 06/23/2020   Procedure: RIGHT HEART CATH;  Surgeon: [name], MD;  Location: [place] [facility];  Service: Cardiovascular;  Laterality: N/A;   RIGHT/LEFT HEART CATH AND CORONARY ANGIOGRAPHY N/A 12/11/2019   Procedure: RIGHT/LEFT HEART CATH AND CORONARY ANGIOGRAPHY;  Surgeon: [name], MD;  Location: [employer] [facility];  Service: Cardiovascular;  Laterality: N/A;   SUBMUCOSAL TATTOO INJECTION  03/02/2020   Procedure: SUBMUCOSAL TATTOO INJECTION;  Surgeon: Yetta Flock, MD;  Location: [facility] ENDOSCOPY;  Service: Gastroenterology;;   SUBMUCOSAL TATTOO INJECTION  05/31/2020   Procedure: SUBMUCOSAL TATTOO INJECTION;  Surgeon: Irene Shipper, MD;  Location: [facility] ENDOSCOPY;  Service: Endoscopy;;   SUBMUCOSAL TATTOO INJECTION  06/21/2022   Procedure: SUBMUCOSAL TATTOO INJECTION;  Surgeon: Lavena Bullion, DO;  [facility];  Service: Gastroenterology;;   TEE WITHOUT CARDIOVERSION N/A 08/02/2021   Procedure: TRANSESOPHAGEAL ECHOCARDIOGRAM (TEE);  Surgeon: [name], MD;  Location: [facility] ENDOSCOPY;  Service: Cardiovascular;  Laterality: N/A;   TUBAL LIGATION     XI ROBOTIC ASSISTED LOWER ANTERIOR RESECTION N/A 05/22/2018   Procedure: XI ROBOTIC  ASSISTED SIGMOID COLOECTOMY MOBILIZATION OF SPLENIC FLEXURE, FIREFLY ASSESSMENT OF PERFUSION;  Surgeon: Michael Boston, MD;  [facility];  Service: General;  Laterality: N/A;  ERAS PATHWAY    Family History  Problem Relation Age of Onset   Asthma Father    Heart attack Father    Asthma Sister    Lung cancer Sister    Heart attack Mother    Stroke Brother    Colon cancer Neg Hx    Esophageal cancer Neg Hx    Pancreatic cancer Neg Hx    Stomach cancer Neg Hx    Liver disease Neg Hx     No Known Allergies  Current Outpatient Medications on File Prior to Visit  Medication Sig Dispense Refill   albuterol (VENTOLIN HFA) 108 (90 Base) MCG/ACT inhaler Inhale 2 puffs into the lungs every 6 (six) hours as needed for wheezing or shortness of breath. 8 g 6   amiodarone (PACERONE) 200 MG tablet Take 1 tablet (200 mg total) by mouth daily. 90 tablet 3   apixaban (ELIQUIS) 2.5 MG TABS tablet Take 1 tablet (2.5 mg total) by mouth 2 (two) times daily. 60 tablet 11   cyclobenzaprine (FLEXERIL) 5 MG tablet Take 5 mg by mouth at bedtime as needed for muscle spasms.     gabapentin (NEURONTIN) 300 MG capsule Take 1 capsule (300 mg total) by mouth daily. Fill one rx. Computer may have sent over 2 (Patient taking differently: Take 300 mg by mouth daily.) 90 capsule 2   ipratropium (ATROVENT HFA) 17 MCG/ACT inhaler Inhale 2 puffs into the lungs every 6 (six) hours as needed for wheezing. 12.9 g 1   levothyroxine (SYNTHROID) 125 MCG tablet Take 1 tablet (125 mcg total) by mouth daily before breakfast. 30 tablet 11   lidocaine-prilocaine (EMLA) cream Apply 1 Application topically as needed.     nitroGLYCERIN (NITROSTAT) 0.4 MG SL tablet Place 1 tablet (0.4 mg total) under the tongue every 5 (five) minutes as needed for chest pain. 25 tablet 0   No current facility-administered medications on file prior to visit.    BP (!) 130/96   Pulse 78   Temp 98 F (36.7 C)   Resp 18   Ht 4\' 11"  (1.499 m)   Wt  129 lb (58.5 kg)   SpO2 100%   BMI 26.05 kg/m        Objective:   Physical Exam  General Mental Status- Alert. General Appearance- Not in acute distress.   Skin General: Color- Normal Color. Moisture- Normal Moisture.  Neck Carotid Arteries- Normal color. Moisture- Normal Moisture. No carotid bruits. No JVD.  Chest and Lung Exam Auscultation: Breath Sounds:-Normal.  Cardiovascular Auscultation:Rythm- Regular. Murmurs & Other Heart Sounds:Auscultation of the heart reveals- No Murmurs.   Neurologic Cranial Nerve exam:- CN III-XII intact(No nystagmus), symmetric smile. Drift Test:- No drift. Romberg Exam:- Negative.  Heal to Toe Gait exam:-Normal. Finger to Nose:- Normal/Intact Strength:- 5/5 equal and symmetric strength both upper and lower extremities.       Assessment & Plan:   ESRD with chf. Currently no indication of chf flare. No pedal edema and no jvd. Continue on current regimen of dialysis.   Chf- no indication of flare.  Chronic anticoagulation with Eliquis-history of atrial fibrillation/flutter   History of recurrent anemia, higher iron deficiency, and melena with known history of small bowel AVMs. One week ago level stable/not in anemic range now.  Insomnia- despite using trazadone 50 mg tablet. You have melatonin 5 mg tab at home. Can try 10 mg tab. If this does not work let me know.   Fatigue- likely from ckd. But will repeat your cbc  and get b vitamin levels.   Follow up date to be determined after lab review.  Mackie Pai, PA-C

## 2022-07-27 NOTE — Patient Instructions (Signed)
ESRD with chf. Currently no indication of chf flare. No pedal edema and no jvd. Continue on current regimen of dialysis.   Chf- no indication of flare.  Chronic anticoagulation with Eliquis-history of atrial fibrillation/flutter   History of recurrent anemia, higher iron deficiency, and melena with known history of small bowel AVMs. One week ago level stable/not in anemic range now.  Insomnia- despite using trazadone 50 mg tablet. You have melatonin 5 mg tab at home. Can try 10 mg tab. If this does not work let me know.   Fatigue- likely from ckd. But will repeat your cbc and get b vitamin levels.   Follow up date to be determined after lab review.

## 2022-07-28 ENCOUNTER — Telehealth: Payer: Self-pay | Admitting: Medical

## 2022-07-28 DIAGNOSIS — N186 End stage renal disease: Secondary | ICD-10-CM | POA: Diagnosis not present

## 2022-07-28 DIAGNOSIS — N2581 Secondary hyperparathyroidism of renal origin: Secondary | ICD-10-CM | POA: Diagnosis not present

## 2022-07-28 DIAGNOSIS — D631 Anemia in chronic kidney disease: Secondary | ICD-10-CM | POA: Diagnosis not present

## 2022-07-28 DIAGNOSIS — E119 Type 2 diabetes mellitus without complications: Secondary | ICD-10-CM | POA: Diagnosis not present

## 2022-07-28 DIAGNOSIS — D509 Iron deficiency anemia, unspecified: Secondary | ICD-10-CM | POA: Diagnosis not present

## 2022-07-28 DIAGNOSIS — L299 Pruritus, unspecified: Secondary | ICD-10-CM | POA: Diagnosis not present

## 2022-07-28 DIAGNOSIS — Z992 Dependence on renal dialysis: Secondary | ICD-10-CM | POA: Diagnosis not present

## 2022-07-28 NOTE — Telephone Encounter (Signed)
Copied from [place] [id number]. Topic: Medicare AWV >> Jul 28, 2022 10:41 AM Devoria Glassing wrote: Reason for CRM: Called patient to schedule Medicare Annual Wellness Visit (AWV). Left message for patient to call back and schedule Medicare Annual Wellness Visit (AWV).  Last date of AWV: 07/15/21  Please schedule an appointment at any time with Beatris Ship, CMA after 08/07/22 on AWV schedule  .  If any questions, please contact me.  Thank you ,  Sherol Dade; [place] Direct Dial: [phone]

## 2022-07-31 DIAGNOSIS — N186 End stage renal disease: Secondary | ICD-10-CM | POA: Diagnosis not present

## 2022-07-31 DIAGNOSIS — N2581 Secondary hyperparathyroidism of renal origin: Secondary | ICD-10-CM | POA: Diagnosis not present

## 2022-07-31 DIAGNOSIS — E119 Type 2 diabetes mellitus without complications: Secondary | ICD-10-CM | POA: Diagnosis not present

## 2022-07-31 DIAGNOSIS — D631 Anemia in chronic kidney disease: Secondary | ICD-10-CM | POA: Diagnosis not present

## 2022-07-31 DIAGNOSIS — D509 Iron deficiency anemia, unspecified: Secondary | ICD-10-CM | POA: Diagnosis not present

## 2022-07-31 DIAGNOSIS — L299 Pruritus, unspecified: Secondary | ICD-10-CM | POA: Diagnosis not present

## 2022-07-31 DIAGNOSIS — Z992 Dependence on renal dialysis: Secondary | ICD-10-CM | POA: Diagnosis not present

## 2022-07-31 LAB — VITAMIN B1: Vitamin B1 (Thiamine): 8 nmol/L (ref 8–30)

## 2022-08-01 ENCOUNTER — Encounter: Payer: Self-pay | Admitting: Oncology

## 2022-08-01 DIAGNOSIS — Z992 Dependence on renal dialysis: Secondary | ICD-10-CM | POA: Diagnosis not present

## 2022-08-01 DIAGNOSIS — I871 Compression of vein: Secondary | ICD-10-CM | POA: Diagnosis not present

## 2022-08-01 DIAGNOSIS — N186 End stage renal disease: Secondary | ICD-10-CM | POA: Diagnosis not present

## 2022-08-01 DIAGNOSIS — T82858A Stenosis of vascular prosthetic devices, implants and grafts, initial encounter: Secondary | ICD-10-CM | POA: Diagnosis not present

## 2022-08-02 ENCOUNTER — Ambulatory Visit: Payer: 59

## 2022-08-03 ENCOUNTER — Ambulatory Visit: Payer: 59 | Attending: Internal Medicine

## 2022-08-03 DIAGNOSIS — Z992 Dependence on renal dialysis: Secondary | ICD-10-CM | POA: Diagnosis not present

## 2022-08-03 DIAGNOSIS — N186 End stage renal disease: Secondary | ICD-10-CM | POA: Diagnosis not present

## 2022-08-03 DIAGNOSIS — I428 Other cardiomyopathies: Secondary | ICD-10-CM

## 2022-08-03 DIAGNOSIS — T82848A Pain from vascular prosthetic devices, implants and grafts, initial encounter: Secondary | ICD-10-CM | POA: Diagnosis not present

## 2022-08-03 LAB — CUP PACEART INCLINIC DEVICE CHECK
Date Time Interrogation Session: 20240328095939
Implantable Lead Connection Status: 753985
Implantable Lead Connection Status: 753985
Implantable Lead Connection Status: 753985
Implantable Lead Implant Date: 20080818
Implantable Lead Implant Date: 20080818
Implantable Lead Implant Date: 20080818
Implantable Lead Location: 753858
Implantable Lead Location: 753859
Implantable Lead Location: 753860
Implantable Lead Model: 4193
Implantable Lead Model: 5076
Implantable Lead Model: 6947
Implantable Pulse Generator Implant Date: 20240315

## 2022-08-03 NOTE — Progress Notes (Signed)
Wound check appointment. Steri-strips removed. Wound without redness.  Swelling at incision site-soft with some bruising noted.  Evaluated by Dr. Myles Gip.  No changes recommended. Incision edges approximated, wound well healed. Normal device function. Thresholds, sensing, and impedances consistent with implant measurements. Device programmed at chronic settings. Histogram distribution appropriate for patient and level of activity. No mode switches or ventricular arrhythmias noted. Patient educated about wound care and shock plan. ROV in 3 months with implanting physician.

## 2022-08-03 NOTE — Patient Instructions (Addendum)
After Your ICD (Implantable Cardiac Defibrillator)    Monitor your defibrillator site for redness, swelling, and drainage. Call the device clinic at [phone] if you experience these symptoms or fever/chills.  Your incision was closed with Dermabond:  You may shower 1 day after your defibrillator implant and wash your incision with soap and water. Avoid lotions, ointments, or perfumes over your incision until it is well-healed.  You may use a hot tub or a pool after your wound check appointment if the incision is completely closed.   There are no restrictions in arm movement after your wound check appointment.   Your ICD is designed to protect you from life threatening heart rhythms. Because of this, you may receive a shock.   1 shock with no symptoms:  Call the office during business hours. 1 shock with symptoms (chest pain, chest pressure, dizziness, lightheadedness, shortness of breath, overall feeling unwell):  Call 911. If you experience 2 or more shocks in 24 hours:  Call 911. If you receive a shock, you should not drive.  [place] DMV - no driving for 6 months if you receive appropriate therapy from your ICD.   ICD Alerts:  Some alerts are vibratory and others beep. These are NOT emergencies. Please call our office to let us know. If this occurs at night or on weekends, it can wait until the next business day. Send a remote transmission.  If your device is capable of reading fluid status (for heart failure), you will be offered monthly monitoring to review this with you.   Remote monitoring is used to monitor your ICD from home. This monitoring is scheduled every 91 days by our office. It allows us to keep an eye on the functioning of your device to ensure it is working properly. You will routinely see your Electrophysiologist annually (more often if necessary).  

## 2022-08-04 DIAGNOSIS — L299 Pruritus, unspecified: Secondary | ICD-10-CM | POA: Diagnosis not present

## 2022-08-04 DIAGNOSIS — N186 End stage renal disease: Secondary | ICD-10-CM | POA: Diagnosis not present

## 2022-08-04 DIAGNOSIS — D509 Iron deficiency anemia, unspecified: Secondary | ICD-10-CM | POA: Diagnosis not present

## 2022-08-04 DIAGNOSIS — E119 Type 2 diabetes mellitus without complications: Secondary | ICD-10-CM | POA: Diagnosis not present

## 2022-08-04 DIAGNOSIS — N2581 Secondary hyperparathyroidism of renal origin: Secondary | ICD-10-CM | POA: Diagnosis not present

## 2022-08-04 DIAGNOSIS — Z992 Dependence on renal dialysis: Secondary | ICD-10-CM | POA: Diagnosis not present

## 2022-08-04 DIAGNOSIS — D631 Anemia in chronic kidney disease: Secondary | ICD-10-CM | POA: Diagnosis not present

## 2022-08-06 DIAGNOSIS — Z992 Dependence on renal dialysis: Secondary | ICD-10-CM | POA: Diagnosis not present

## 2022-08-06 DIAGNOSIS — N186 End stage renal disease: Secondary | ICD-10-CM | POA: Diagnosis not present

## 2022-08-06 DIAGNOSIS — I129 Hypertensive chronic kidney disease with stage 1 through stage 4 chronic kidney disease, or unspecified chronic kidney disease: Secondary | ICD-10-CM | POA: Diagnosis not present

## 2022-08-07 DIAGNOSIS — L299 Pruritus, unspecified: Secondary | ICD-10-CM | POA: Diagnosis not present

## 2022-08-07 DIAGNOSIS — N186 End stage renal disease: Secondary | ICD-10-CM | POA: Diagnosis not present

## 2022-08-07 DIAGNOSIS — Z992 Dependence on renal dialysis: Secondary | ICD-10-CM | POA: Diagnosis not present

## 2022-08-07 DIAGNOSIS — N2581 Secondary hyperparathyroidism of renal origin: Secondary | ICD-10-CM | POA: Diagnosis not present

## 2022-08-07 DIAGNOSIS — D509 Iron deficiency anemia, unspecified: Secondary | ICD-10-CM | POA: Diagnosis not present

## 2022-08-07 DIAGNOSIS — D631 Anemia in chronic kidney disease: Secondary | ICD-10-CM | POA: Diagnosis not present

## 2022-08-07 DIAGNOSIS — E119 Type 2 diabetes mellitus without complications: Secondary | ICD-10-CM | POA: Diagnosis not present

## 2022-08-09 DIAGNOSIS — L299 Pruritus, unspecified: Secondary | ICD-10-CM | POA: Diagnosis not present

## 2022-08-09 DIAGNOSIS — D631 Anemia in chronic kidney disease: Secondary | ICD-10-CM | POA: Diagnosis not present

## 2022-08-09 DIAGNOSIS — N186 End stage renal disease: Secondary | ICD-10-CM | POA: Diagnosis not present

## 2022-08-09 DIAGNOSIS — N2581 Secondary hyperparathyroidism of renal origin: Secondary | ICD-10-CM | POA: Diagnosis not present

## 2022-08-09 DIAGNOSIS — Z992 Dependence on renal dialysis: Secondary | ICD-10-CM | POA: Diagnosis not present

## 2022-08-09 DIAGNOSIS — E119 Type 2 diabetes mellitus without complications: Secondary | ICD-10-CM | POA: Diagnosis not present

## 2022-08-09 DIAGNOSIS — D509 Iron deficiency anemia, unspecified: Secondary | ICD-10-CM | POA: Diagnosis not present

## 2022-08-11 DIAGNOSIS — D509 Iron deficiency anemia, unspecified: Secondary | ICD-10-CM | POA: Diagnosis not present

## 2022-08-11 DIAGNOSIS — N186 End stage renal disease: Secondary | ICD-10-CM | POA: Diagnosis not present

## 2022-08-11 DIAGNOSIS — L299 Pruritus, unspecified: Secondary | ICD-10-CM | POA: Diagnosis not present

## 2022-08-11 DIAGNOSIS — D631 Anemia in chronic kidney disease: Secondary | ICD-10-CM | POA: Diagnosis not present

## 2022-08-11 DIAGNOSIS — Z992 Dependence on renal dialysis: Secondary | ICD-10-CM | POA: Diagnosis not present

## 2022-08-11 DIAGNOSIS — E119 Type 2 diabetes mellitus without complications: Secondary | ICD-10-CM | POA: Diagnosis not present

## 2022-08-11 DIAGNOSIS — N2581 Secondary hyperparathyroidism of renal origin: Secondary | ICD-10-CM | POA: Diagnosis not present

## 2022-08-14 DIAGNOSIS — N2581 Secondary hyperparathyroidism of renal origin: Secondary | ICD-10-CM | POA: Diagnosis not present

## 2022-08-14 DIAGNOSIS — Z992 Dependence on renal dialysis: Secondary | ICD-10-CM | POA: Diagnosis not present

## 2022-08-14 DIAGNOSIS — L299 Pruritus, unspecified: Secondary | ICD-10-CM | POA: Diagnosis not present

## 2022-08-14 DIAGNOSIS — D509 Iron deficiency anemia, unspecified: Secondary | ICD-10-CM | POA: Diagnosis not present

## 2022-08-14 DIAGNOSIS — N186 End stage renal disease: Secondary | ICD-10-CM | POA: Diagnosis not present

## 2022-08-14 DIAGNOSIS — D631 Anemia in chronic kidney disease: Secondary | ICD-10-CM | POA: Diagnosis not present

## 2022-08-14 DIAGNOSIS — E119 Type 2 diabetes mellitus without complications: Secondary | ICD-10-CM | POA: Diagnosis not present

## 2022-08-15 ENCOUNTER — Ambulatory Visit (INDEPENDENT_AMBULATORY_CARE_PROVIDER_SITE_OTHER): Payer: 59 | Admitting: Podiatry

## 2022-08-15 ENCOUNTER — Encounter: Payer: Self-pay | Admitting: Podiatry

## 2022-08-15 VITALS — BP 146/78

## 2022-08-15 DIAGNOSIS — Z992 Dependence on renal dialysis: Secondary | ICD-10-CM

## 2022-08-15 DIAGNOSIS — Q828 Other specified congenital malformations of skin: Secondary | ICD-10-CM

## 2022-08-15 DIAGNOSIS — E0822 Diabetes mellitus due to underlying condition with diabetic chronic kidney disease: Secondary | ICD-10-CM | POA: Diagnosis not present

## 2022-08-15 DIAGNOSIS — M79675 Pain in left toe(s): Secondary | ICD-10-CM | POA: Diagnosis not present

## 2022-08-15 DIAGNOSIS — M79674 Pain in right toe(s): Secondary | ICD-10-CM | POA: Diagnosis not present

## 2022-08-15 DIAGNOSIS — B351 Tinea unguium: Secondary | ICD-10-CM | POA: Diagnosis not present

## 2022-08-15 DIAGNOSIS — N186 End stage renal disease: Secondary | ICD-10-CM

## 2022-08-16 DIAGNOSIS — D509 Iron deficiency anemia, unspecified: Secondary | ICD-10-CM | POA: Diagnosis not present

## 2022-08-16 DIAGNOSIS — Z992 Dependence on renal dialysis: Secondary | ICD-10-CM | POA: Diagnosis not present

## 2022-08-16 DIAGNOSIS — E119 Type 2 diabetes mellitus without complications: Secondary | ICD-10-CM | POA: Diagnosis not present

## 2022-08-16 DIAGNOSIS — D631 Anemia in chronic kidney disease: Secondary | ICD-10-CM | POA: Diagnosis not present

## 2022-08-16 DIAGNOSIS — L299 Pruritus, unspecified: Secondary | ICD-10-CM | POA: Diagnosis not present

## 2022-08-16 DIAGNOSIS — N186 End stage renal disease: Secondary | ICD-10-CM | POA: Diagnosis not present

## 2022-08-16 DIAGNOSIS — N2581 Secondary hyperparathyroidism of renal origin: Secondary | ICD-10-CM | POA: Diagnosis not present

## 2022-08-18 DIAGNOSIS — L299 Pruritus, unspecified: Secondary | ICD-10-CM | POA: Diagnosis not present

## 2022-08-18 DIAGNOSIS — Z992 Dependence on renal dialysis: Secondary | ICD-10-CM | POA: Diagnosis not present

## 2022-08-18 DIAGNOSIS — N186 End stage renal disease: Secondary | ICD-10-CM | POA: Diagnosis not present

## 2022-08-18 DIAGNOSIS — N2581 Secondary hyperparathyroidism of renal origin: Secondary | ICD-10-CM | POA: Diagnosis not present

## 2022-08-18 DIAGNOSIS — E119 Type 2 diabetes mellitus without complications: Secondary | ICD-10-CM | POA: Diagnosis not present

## 2022-08-18 DIAGNOSIS — D631 Anemia in chronic kidney disease: Secondary | ICD-10-CM | POA: Diagnosis not present

## 2022-08-18 DIAGNOSIS — D509 Iron deficiency anemia, unspecified: Secondary | ICD-10-CM | POA: Diagnosis not present

## 2022-08-18 NOTE — Progress Notes (Signed)
Remote ICD transmission.   

## 2022-08-19 NOTE — Progress Notes (Signed)
  Subjective:  Patient ID: Morgan Clay, female    DOB: [birth year],  MRN: [id number]  Morgan Clay presents to clinic today for at risk foot care. Pt has h/o NIDDM with chronic kidney disease and painful porokeratotic lesion(s) left foot and painful mycotic toenails that limit ambulation. Painful toenails interfere with ambulation. Aggravating factors include wearing enclosed shoe gear. Pain is relieved with periodic professional debridement. Painful porokeratotic lesions are aggravated when weightbearing with and without shoegear. Pain is relieved with periodic professional debridement.  Chief Complaint  Patient presents with   Nail Problem    RFC PCP-Saguier PCP VST-3 weeks ago   New problem(s): None.   PCP is Saguier, Ramon Dredge, PA-C.  No Known Allergies  Review of Systems: Negative except as noted in the HPI.  Objective: Vitals:   08/15/22 1058  BP: (!) 146/78   Morgan Clay is a pleasant 77 y.o. female WD, WN in NAD. AAO x 3.  Vascular Examination: CFT <3 seconds b/l LE. Palpable DP pulse(s) b/l LE. Palpable PT pulse(s) b/l LE. Pedal hair absent. No pain with calf compression b/l. Lower extremity skin temperature gradient within normal limits. No edema noted b/l LE. No cyanosis or clubbing noted b/l LE.  Dermatological Examination: No open wounds b/l LE. No interdigital macerations noted b/l LE. Toenails 1-5 b/l elongated, discolored, dystrophic, thickened, crumbly with subungual debris and tenderness to dorsal palpation.   Porokeratotic lesion(s) submet head 2 left foot. No erythema, no edema, no drainage, no fluctuance.  Neurological Examination: Pt has subjective symptoms of neuropathy. Protective sensation intact 5/5 intact bilaterally with 10g monofilament b/l. Vibratory sensation intact b/l.  Musculoskeletal Examination: Muscle strength 5/5 to all lower extremity muscle groups bilaterally. No pain, crepitus or joint limitation noted with ROM bilateral LE.  Pes planus deformity noted bilateral LE.  Assessment/Plan: 1. Pain due to onychomycosis of toenails of both feet   2. Porokeratosis   3. Diabetes mellitus due to underlying condition with chronic kidney disease on chronic dialysis, without long-term current use of insulin    -Consent given for treatment as described below: -Examined patient. -Patient to continue soft, supportive shoe gear daily. -Mycotic toenails 1-5 bilaterally were debrided in length and girth with sterile nail nippers and dremel without incident. -Porokeratotic lesion(s) submet head 2 left foot pared and enucleated with sterile currette without incident. Total number of lesions debrided=1. -Patient/POA to call should there be question/concern in the interim.   Return in about 3 months (around 11/14/2022).  Freddie Breech, DPM

## 2022-08-21 DIAGNOSIS — D631 Anemia in chronic kidney disease: Secondary | ICD-10-CM | POA: Diagnosis not present

## 2022-08-21 DIAGNOSIS — N186 End stage renal disease: Secondary | ICD-10-CM | POA: Diagnosis not present

## 2022-08-21 DIAGNOSIS — E119 Type 2 diabetes mellitus without complications: Secondary | ICD-10-CM | POA: Diagnosis not present

## 2022-08-21 DIAGNOSIS — N2581 Secondary hyperparathyroidism of renal origin: Secondary | ICD-10-CM | POA: Diagnosis not present

## 2022-08-21 DIAGNOSIS — Z992 Dependence on renal dialysis: Secondary | ICD-10-CM | POA: Diagnosis not present

## 2022-08-21 DIAGNOSIS — D509 Iron deficiency anemia, unspecified: Secondary | ICD-10-CM | POA: Diagnosis not present

## 2022-08-21 DIAGNOSIS — L299 Pruritus, unspecified: Secondary | ICD-10-CM | POA: Diagnosis not present

## 2022-08-23 DIAGNOSIS — D509 Iron deficiency anemia, unspecified: Secondary | ICD-10-CM | POA: Diagnosis not present

## 2022-08-23 DIAGNOSIS — D631 Anemia in chronic kidney disease: Secondary | ICD-10-CM | POA: Diagnosis not present

## 2022-08-23 DIAGNOSIS — N2581 Secondary hyperparathyroidism of renal origin: Secondary | ICD-10-CM | POA: Diagnosis not present

## 2022-08-23 DIAGNOSIS — E119 Type 2 diabetes mellitus without complications: Secondary | ICD-10-CM | POA: Diagnosis not present

## 2022-08-23 DIAGNOSIS — Z992 Dependence on renal dialysis: Secondary | ICD-10-CM | POA: Diagnosis not present

## 2022-08-23 DIAGNOSIS — L299 Pruritus, unspecified: Secondary | ICD-10-CM | POA: Diagnosis not present

## 2022-08-23 DIAGNOSIS — N186 End stage renal disease: Secondary | ICD-10-CM | POA: Diagnosis not present

## 2022-08-24 ENCOUNTER — Other Ambulatory Visit: Payer: Self-pay | Admitting: Nurse Practitioner

## 2022-08-24 DIAGNOSIS — K7469 Other cirrhosis of liver: Secondary | ICD-10-CM

## 2022-08-24 DIAGNOSIS — I8510 Secondary esophageal varices without bleeding: Secondary | ICD-10-CM | POA: Diagnosis not present

## 2022-08-25 DIAGNOSIS — L299 Pruritus, unspecified: Secondary | ICD-10-CM | POA: Diagnosis not present

## 2022-08-25 DIAGNOSIS — N2581 Secondary hyperparathyroidism of renal origin: Secondary | ICD-10-CM | POA: Diagnosis not present

## 2022-08-25 DIAGNOSIS — E119 Type 2 diabetes mellitus without complications: Secondary | ICD-10-CM | POA: Diagnosis not present

## 2022-08-25 DIAGNOSIS — N186 End stage renal disease: Secondary | ICD-10-CM | POA: Diagnosis not present

## 2022-08-25 DIAGNOSIS — D631 Anemia in chronic kidney disease: Secondary | ICD-10-CM | POA: Diagnosis not present

## 2022-08-25 DIAGNOSIS — Z992 Dependence on renal dialysis: Secondary | ICD-10-CM | POA: Diagnosis not present

## 2022-08-25 DIAGNOSIS — D509 Iron deficiency anemia, unspecified: Secondary | ICD-10-CM | POA: Diagnosis not present

## 2022-08-28 DIAGNOSIS — N2581 Secondary hyperparathyroidism of renal origin: Secondary | ICD-10-CM | POA: Diagnosis not present

## 2022-08-28 DIAGNOSIS — D631 Anemia in chronic kidney disease: Secondary | ICD-10-CM | POA: Diagnosis not present

## 2022-08-28 DIAGNOSIS — N186 End stage renal disease: Secondary | ICD-10-CM | POA: Diagnosis not present

## 2022-08-28 DIAGNOSIS — Z992 Dependence on renal dialysis: Secondary | ICD-10-CM | POA: Diagnosis not present

## 2022-08-28 DIAGNOSIS — E119 Type 2 diabetes mellitus without complications: Secondary | ICD-10-CM | POA: Diagnosis not present

## 2022-08-28 DIAGNOSIS — D509 Iron deficiency anemia, unspecified: Secondary | ICD-10-CM | POA: Diagnosis not present

## 2022-08-28 DIAGNOSIS — L299 Pruritus, unspecified: Secondary | ICD-10-CM | POA: Diagnosis not present

## 2022-08-30 ENCOUNTER — Other Ambulatory Visit: Payer: Self-pay | Admitting: Medical

## 2022-08-30 DIAGNOSIS — N186 End stage renal disease: Secondary | ICD-10-CM | POA: Diagnosis not present

## 2022-08-30 DIAGNOSIS — E119 Type 2 diabetes mellitus without complications: Secondary | ICD-10-CM | POA: Diagnosis not present

## 2022-08-30 DIAGNOSIS — N2581 Secondary hyperparathyroidism of renal origin: Secondary | ICD-10-CM | POA: Diagnosis not present

## 2022-08-30 DIAGNOSIS — Z992 Dependence on renal dialysis: Secondary | ICD-10-CM | POA: Diagnosis not present

## 2022-08-30 DIAGNOSIS — D509 Iron deficiency anemia, unspecified: Secondary | ICD-10-CM | POA: Diagnosis not present

## 2022-08-30 DIAGNOSIS — D631 Anemia in chronic kidney disease: Secondary | ICD-10-CM | POA: Diagnosis not present

## 2022-08-30 DIAGNOSIS — L299 Pruritus, unspecified: Secondary | ICD-10-CM | POA: Diagnosis not present

## 2022-08-31 ENCOUNTER — Ambulatory Visit: Payer: 59

## 2022-09-01 DIAGNOSIS — N186 End stage renal disease: Secondary | ICD-10-CM | POA: Diagnosis not present

## 2022-09-01 DIAGNOSIS — D509 Iron deficiency anemia, unspecified: Secondary | ICD-10-CM | POA: Diagnosis not present

## 2022-09-01 DIAGNOSIS — Z992 Dependence on renal dialysis: Secondary | ICD-10-CM | POA: Diagnosis not present

## 2022-09-01 DIAGNOSIS — E119 Type 2 diabetes mellitus without complications: Secondary | ICD-10-CM | POA: Diagnosis not present

## 2022-09-01 DIAGNOSIS — L299 Pruritus, unspecified: Secondary | ICD-10-CM | POA: Diagnosis not present

## 2022-09-01 DIAGNOSIS — N2581 Secondary hyperparathyroidism of renal origin: Secondary | ICD-10-CM | POA: Diagnosis not present

## 2022-09-01 DIAGNOSIS — D631 Anemia in chronic kidney disease: Secondary | ICD-10-CM | POA: Diagnosis not present

## 2022-09-04 ENCOUNTER — Ambulatory Visit: Payer: 59 | Admitting: Medical

## 2022-09-04 DIAGNOSIS — N2581 Secondary hyperparathyroidism of renal origin: Secondary | ICD-10-CM | POA: Diagnosis not present

## 2022-09-04 DIAGNOSIS — L299 Pruritus, unspecified: Secondary | ICD-10-CM | POA: Diagnosis not present

## 2022-09-04 DIAGNOSIS — D631 Anemia in chronic kidney disease: Secondary | ICD-10-CM | POA: Diagnosis not present

## 2022-09-04 DIAGNOSIS — D509 Iron deficiency anemia, unspecified: Secondary | ICD-10-CM | POA: Diagnosis not present

## 2022-09-04 DIAGNOSIS — N186 End stage renal disease: Secondary | ICD-10-CM | POA: Diagnosis not present

## 2022-09-04 DIAGNOSIS — Z992 Dependence on renal dialysis: Secondary | ICD-10-CM | POA: Diagnosis not present

## 2022-09-04 DIAGNOSIS — E119 Type 2 diabetes mellitus without complications: Secondary | ICD-10-CM | POA: Diagnosis not present

## 2022-09-05 ENCOUNTER — Ambulatory Visit (INDEPENDENT_AMBULATORY_CARE_PROVIDER_SITE_OTHER): Payer: 59 | Admitting: Family Medicine

## 2022-09-05 ENCOUNTER — Encounter: Payer: Self-pay | Admitting: Family Medicine

## 2022-09-05 VITALS — BP 136/84 | HR 78 | Temp 98.0°F | Ht 59.0 in | Wt 134.2 lb

## 2022-09-05 DIAGNOSIS — L309 Dermatitis, unspecified: Secondary | ICD-10-CM

## 2022-09-05 DIAGNOSIS — E1165 Type 2 diabetes mellitus with hyperglycemia: Secondary | ICD-10-CM | POA: Diagnosis not present

## 2022-09-05 DIAGNOSIS — Z992 Dependence on renal dialysis: Secondary | ICD-10-CM | POA: Diagnosis not present

## 2022-09-05 DIAGNOSIS — L858 Other specified epidermal thickening: Secondary | ICD-10-CM

## 2022-09-05 DIAGNOSIS — N186 End stage renal disease: Secondary | ICD-10-CM | POA: Diagnosis not present

## 2022-09-05 DIAGNOSIS — R5383 Other fatigue: Secondary | ICD-10-CM | POA: Diagnosis not present

## 2022-09-05 DIAGNOSIS — I129 Hypertensive chronic kidney disease with stage 1 through stage 4 chronic kidney disease, or unspecified chronic kidney disease: Secondary | ICD-10-CM | POA: Diagnosis not present

## 2022-09-05 MED ORDER — TRIAMCINOLONE ACETONIDE 0.1 % EX CREA: TOPICAL_CREAM | CUTANEOUS | 0 refills | Status: DC

## 2022-09-05 MED ORDER — AMMONIUM LACTATE 12 % EX LOTN: TOPICAL_LOTION | CUTANEOUS | 0 refills | Status: DC

## 2022-09-05 NOTE — Progress Notes (Signed)
Chief Complaint  Patient presents with   Rash    Arms and legs    Morgan Clay is a 77 y.o. female here for a skin complaint.  Duration: 1 week it recurred; initially started around 1 mo ago Location: arms and legs Pruritic? Yes Painful? No Drainage? No New soaps/lotions/topicals/detergents? No Sick contacts? No Other associated symptoms: no fevers or spreading Therapies tried thus far: Gold Bond wasn't quite helpful  Past Medical History:  Diagnosis Date   Anemia    Angiectasia    Angiodysplasia of small intestine 06/10/2020   Ileum - seen on capsule endoscopy 05/2020 - ablated at colonoscopy   Aortic atherosclerosis (HCC) 08/30/2017   Arthritis    "qwhere" (01/03/2018)   Asthma    reports mild asthma since childhood - had COPD on dx list from prior PCP   Biventricular ICD (implantable cardioverter-defibrillator) in place 01/08/2007   Qualifier: Diagnosis of  By: Daphine Deutscher FNP, [place]     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) 09/24/2020   Chronic obstructive pulmonary disease (HCC)    Chronic systolic congestive heart failure (HCC)    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 (HCV) (HCC)    HEPATITIS C - s/p treatment with Harvoni, saw hepatology, Dawn Drazek   Diabetes [facility])    Diverticulitis of left colon status post robotic low anterior to sigmoid resection 05/22/2018 07/31/2017   Duodenal arteriovenous malformation    End stage renal disease (HCC) 03/22/2021   Esophageal varices (HCC)    Glaucoma    Gout    History of blood transfusion ~ 11/2017   Hypertension associated with diabetes (HCC) 06/07/2017   Hypothyroidism    Internal hemorrhoids    LBBB (left bundle branch block)    Malnutrition of moderate degree 07/09/2020   MDD (major depressive disorder), recurrent, in partial remission (HCC) 06/07/2017   OBSTRUCTIVE SLEEP APNEA 11/14/2007   no CPAP   OSTEOPENIA 09/30/2008   Persistent atrial fibrillation (HCC)  02/06/2020   Polyneuropathy associated with underlying disease (HCC) 06/09/2021   Pulmonary hypertension (HCC) on echocardiogram 01/14/2018   Secondary esophageal varices without bleeding (HCC) 12/23/2020   Formatting of this note might be different from the original. Patient had trace esophageal varices during small bowel enteroscopy in October 2021, follow-up EGD in January 2022 with no varices.  She previously had GI bleeding thought to be from AVM versus GAVE.  She is no longer on carvedilol and will need surveillance endoscopy.  Patient was previously followed at [facility] and I have asked her    Secondary hypercoagulable state (HCC) 02/06/2020   Type 2 diabetes, controlled, with renal manifestation (HCC) 11/24/2013    BP 136/84 (BP Location: Left Arm, Cuff Size: Normal)   Pulse 78   Temp 98 F (36.7 C) (Oral)   Ht 4\' 11"  (1.499 m)   Wt 134 lb 4 oz (60.9 kg)   SpO2 95%   BMI 27.12 kg/m  Gen: awake, alert, appearing stated age Lungs: No accessory muscle use Skin: Hyperpigmentation of the base of her follicles of bilateral forearms, more prominent on the right. No drainage, erythema, TTP, fluctuance, excoriation. Over the anterior tibial region bilaterally there are hyperpigmented patches that are scaly.  They are not raised, TTP, excessively warm, fluctuant, or showing any drainage. Psych: Age appropriate judgment and insight  KP (keratosis pilaris) - Plan: ammonium lactate (LAC-HYDRIN) 12 % lotion  Dermatitis - Plan: triamcinolone cream (KENALOG) 0.1 %  Fatigue, unspecified type - Plan: Comprehensive metabolic panel,  TSH, CBC  ESRD (end stage renal disease) (HCC) - Plan: VITAMIN D 25 Hydroxy (Vit-D Deficiency, Fractures)  Type 2 diabetes mellitus with hyperglycemia, without long-term current use of insulin (HCC) - Plan: Hemoglobin A1c  Exfoliative lotion as above.  May take several weeks to work. Needs to moisturize.  Probably a component of postinflammatory  hyperpigmentation.  7 days of a steroid cream was also sent. At the end she mentioned how her body feels heavy all the time and this is been going on for many months.  She is not on any acute distress and does not report any new chest pain or shortness of breath.  Could be related to her hemodialysis.  I ordered some basic labs and will have her follow-up with her regular PCP next week. F/u prn. The patient voiced understanding and agreement to the plan.  Jilda Roche [place], DO 09/05/22 2:44 PM

## 2022-09-05 NOTE — Patient Instructions (Addendum)
Try not to scratch as this can make things worse. Avoid scented products while dealing with this. You may resume when the itchiness resolves. Cold/cool compresses can help.   Moisturize daily.   Give [name] 2-3 business days to get the results of your labs back.   Let us know if you need anything.

## 2022-09-06 ENCOUNTER — Other Ambulatory Visit: Payer: Self-pay | Admitting: Family Medicine

## 2022-09-06 ENCOUNTER — Telehealth: Payer: Self-pay | Admitting: *Deleted

## 2022-09-06 ENCOUNTER — Other Ambulatory Visit: Payer: Self-pay | Admitting: Medical

## 2022-09-06 DIAGNOSIS — E119 Type 2 diabetes mellitus without complications: Secondary | ICD-10-CM | POA: Diagnosis not present

## 2022-09-06 DIAGNOSIS — D509 Iron deficiency anemia, unspecified: Secondary | ICD-10-CM | POA: Diagnosis not present

## 2022-09-06 DIAGNOSIS — N186 End stage renal disease: Secondary | ICD-10-CM | POA: Diagnosis not present

## 2022-09-06 DIAGNOSIS — N2581 Secondary hyperparathyroidism of renal origin: Secondary | ICD-10-CM | POA: Diagnosis not present

## 2022-09-06 DIAGNOSIS — Z992 Dependence on renal dialysis: Secondary | ICD-10-CM | POA: Diagnosis not present

## 2022-09-06 DIAGNOSIS — L299 Pruritus, unspecified: Secondary | ICD-10-CM | POA: Diagnosis not present

## 2022-09-06 DIAGNOSIS — R5383 Other fatigue: Secondary | ICD-10-CM

## 2022-09-06 DIAGNOSIS — D631 Anemia in chronic kidney disease: Secondary | ICD-10-CM | POA: Diagnosis not present

## 2022-09-06 LAB — TSH: TSH: 7.41 u[IU]/mL — ABNORMAL HIGH (ref 0.35–5.50)

## 2022-09-06 LAB — CBC
HCT: 31.7 % — ABNORMAL LOW (ref 36.0–46.0)
Hemoglobin: 10.4 g/dL — ABNORMAL LOW (ref 12.0–15.0)
MCHC: 33 g/dL (ref 30.0–36.0)
MCV: 82.1 fl (ref 78.0–100.0)
Platelets: 126 10*3/uL — ABNORMAL LOW (ref 150.0–400.0)
RBC: 3.86 Mil/uL — ABNORMAL LOW (ref 3.87–5.11)
RDW: 24.5 % — ABNORMAL HIGH (ref 11.5–15.5)
WBC: 4.7 10*3/uL (ref 4.0–10.5)

## 2022-09-06 LAB — COMPREHENSIVE METABOLIC PANEL
ALT: 8 U/L (ref 0–35)
AST: 16 U/L (ref 0–37)
Albumin: 4.2 g/dL (ref 3.5–5.2)
Alkaline Phosphatase: 85 U/L (ref 39–117)
BUN: 22 mg/dL (ref 6–23)
CO2: 31 mEq/L (ref 19–32)
Calcium: 8.5 mg/dL (ref 8.4–10.5)
Chloride: 90 mEq/L — ABNORMAL LOW (ref 96–112)
Creatinine, Ser: 6.27 mg/dL (ref 0.40–1.20)
GFR: 6.13 mL/min — CL (ref >60.00)
Glucose, Bld: 92 mg/dL (ref 70–99)
Potassium: 4.4 mEq/L (ref 3.5–5.1)
Sodium: 133 mEq/L — ABNORMAL LOW (ref 135–145)
Total Bilirubin: 0.5 mg/dL (ref 0.2–1.2)
Total Protein: 8.2 g/dL (ref 6.0–8.3)

## 2022-09-06 LAB — HEMOGLOBIN A1C: Hgb A1c MFr Bld: 5.5 % (ref 4.6–6.5)

## 2022-09-06 LAB — VITAMIN D 25 HYDROXY (VIT D DEFICIENCY, FRACTURES): VITD: 17.17 ng/mL — ABNORMAL LOW (ref 30.00–100.00)

## 2022-09-06 MED ORDER — VITAMIN D (ERGOCALCIFEROL) 1.25 MG (50000 UNIT) PO CAPS: 50000.0000 [IU] | ORAL_CAPSULE | ORAL | 0 refills | Status: DC

## 2022-09-06 NOTE — Telephone Encounter (Signed)
CRITICAL VALUE STICKER  CRITICAL VALUE: creatinine 6.27 and GFR 6.13  MESSENGER (representative from lab): Kennyth Lose   Labs are already in epic

## 2022-09-07 ENCOUNTER — Other Ambulatory Visit (INDEPENDENT_AMBULATORY_CARE_PROVIDER_SITE_OTHER): Payer: 59

## 2022-09-07 DIAGNOSIS — R5383 Other fatigue: Secondary | ICD-10-CM

## 2022-09-07 LAB — T4, FREE: Free T4: 0.98 ng/dL (ref 0.60–1.60)

## 2022-09-07 NOTE — Telephone Encounter (Signed)
Labs sent over to nephrologist

## 2022-09-08 DIAGNOSIS — E119 Type 2 diabetes mellitus without complications: Secondary | ICD-10-CM | POA: Diagnosis not present

## 2022-09-08 DIAGNOSIS — D509 Iron deficiency anemia, unspecified: Secondary | ICD-10-CM | POA: Diagnosis not present

## 2022-09-08 DIAGNOSIS — D631 Anemia in chronic kidney disease: Secondary | ICD-10-CM | POA: Diagnosis not present

## 2022-09-08 DIAGNOSIS — N186 End stage renal disease: Secondary | ICD-10-CM | POA: Diagnosis not present

## 2022-09-08 DIAGNOSIS — Z992 Dependence on renal dialysis: Secondary | ICD-10-CM | POA: Diagnosis not present

## 2022-09-08 DIAGNOSIS — N2581 Secondary hyperparathyroidism of renal origin: Secondary | ICD-10-CM | POA: Diagnosis not present

## 2022-09-08 DIAGNOSIS — L299 Pruritus, unspecified: Secondary | ICD-10-CM | POA: Diagnosis not present

## 2022-09-11 DIAGNOSIS — D631 Anemia in chronic kidney disease: Secondary | ICD-10-CM | POA: Diagnosis not present

## 2022-09-11 DIAGNOSIS — E119 Type 2 diabetes mellitus without complications: Secondary | ICD-10-CM | POA: Diagnosis not present

## 2022-09-11 DIAGNOSIS — L299 Pruritus, unspecified: Secondary | ICD-10-CM | POA: Diagnosis not present

## 2022-09-11 DIAGNOSIS — N2581 Secondary hyperparathyroidism of renal origin: Secondary | ICD-10-CM | POA: Diagnosis not present

## 2022-09-11 DIAGNOSIS — N186 End stage renal disease: Secondary | ICD-10-CM | POA: Diagnosis not present

## 2022-09-11 DIAGNOSIS — Z992 Dependence on renal dialysis: Secondary | ICD-10-CM | POA: Diagnosis not present

## 2022-09-11 DIAGNOSIS — D509 Iron deficiency anemia, unspecified: Secondary | ICD-10-CM | POA: Diagnosis not present

## 2022-09-12 ENCOUNTER — Ambulatory Visit: Payer: 59 | Admitting: Medical

## 2022-09-13 DIAGNOSIS — N186 End stage renal disease: Secondary | ICD-10-CM | POA: Diagnosis not present

## 2022-09-13 DIAGNOSIS — D631 Anemia in chronic kidney disease: Secondary | ICD-10-CM | POA: Diagnosis not present

## 2022-09-13 DIAGNOSIS — L299 Pruritus, unspecified: Secondary | ICD-10-CM | POA: Diagnosis not present

## 2022-09-13 DIAGNOSIS — N2581 Secondary hyperparathyroidism of renal origin: Secondary | ICD-10-CM | POA: Diagnosis not present

## 2022-09-13 DIAGNOSIS — D509 Iron deficiency anemia, unspecified: Secondary | ICD-10-CM | POA: Diagnosis not present

## 2022-09-13 DIAGNOSIS — E119 Type 2 diabetes mellitus without complications: Secondary | ICD-10-CM | POA: Diagnosis not present

## 2022-09-13 DIAGNOSIS — Z992 Dependence on renal dialysis: Secondary | ICD-10-CM | POA: Diagnosis not present

## 2022-09-15 DIAGNOSIS — D509 Iron deficiency anemia, unspecified: Secondary | ICD-10-CM | POA: Diagnosis not present

## 2022-09-15 DIAGNOSIS — L299 Pruritus, unspecified: Secondary | ICD-10-CM | POA: Diagnosis not present

## 2022-09-15 DIAGNOSIS — Z992 Dependence on renal dialysis: Secondary | ICD-10-CM | POA: Diagnosis not present

## 2022-09-15 DIAGNOSIS — D631 Anemia in chronic kidney disease: Secondary | ICD-10-CM | POA: Diagnosis not present

## 2022-09-15 DIAGNOSIS — N186 End stage renal disease: Secondary | ICD-10-CM | POA: Diagnosis not present

## 2022-09-15 DIAGNOSIS — N2581 Secondary hyperparathyroidism of renal origin: Secondary | ICD-10-CM | POA: Diagnosis not present

## 2022-09-15 DIAGNOSIS — E119 Type 2 diabetes mellitus without complications: Secondary | ICD-10-CM | POA: Diagnosis not present

## 2022-09-18 DIAGNOSIS — L299 Pruritus, unspecified: Secondary | ICD-10-CM | POA: Diagnosis not present

## 2022-09-18 DIAGNOSIS — D631 Anemia in chronic kidney disease: Secondary | ICD-10-CM | POA: Diagnosis not present

## 2022-09-18 DIAGNOSIS — N2581 Secondary hyperparathyroidism of renal origin: Secondary | ICD-10-CM | POA: Diagnosis not present

## 2022-09-18 DIAGNOSIS — N186 End stage renal disease: Secondary | ICD-10-CM | POA: Diagnosis not present

## 2022-09-18 DIAGNOSIS — Z992 Dependence on renal dialysis: Secondary | ICD-10-CM | POA: Diagnosis not present

## 2022-09-18 DIAGNOSIS — E119 Type 2 diabetes mellitus without complications: Secondary | ICD-10-CM | POA: Diagnosis not present

## 2022-09-18 DIAGNOSIS — D509 Iron deficiency anemia, unspecified: Secondary | ICD-10-CM | POA: Diagnosis not present

## 2022-09-19 ENCOUNTER — Ambulatory Visit: Payer: 59 | Admitting: Internal Medicine

## 2022-09-20 DIAGNOSIS — N2581 Secondary hyperparathyroidism of renal origin: Secondary | ICD-10-CM | POA: Diagnosis not present

## 2022-09-20 DIAGNOSIS — N186 End stage renal disease: Secondary | ICD-10-CM | POA: Diagnosis not present

## 2022-09-20 DIAGNOSIS — D631 Anemia in chronic kidney disease: Secondary | ICD-10-CM | POA: Diagnosis not present

## 2022-09-20 DIAGNOSIS — E119 Type 2 diabetes mellitus without complications: Secondary | ICD-10-CM | POA: Diagnosis not present

## 2022-09-20 DIAGNOSIS — L299 Pruritus, unspecified: Secondary | ICD-10-CM | POA: Diagnosis not present

## 2022-09-20 DIAGNOSIS — Z992 Dependence on renal dialysis: Secondary | ICD-10-CM | POA: Diagnosis not present

## 2022-09-20 DIAGNOSIS — D509 Iron deficiency anemia, unspecified: Secondary | ICD-10-CM | POA: Diagnosis not present

## 2022-09-22 DIAGNOSIS — N186 End stage renal disease: Secondary | ICD-10-CM | POA: Diagnosis not present

## 2022-09-22 DIAGNOSIS — N2581 Secondary hyperparathyroidism of renal origin: Secondary | ICD-10-CM | POA: Diagnosis not present

## 2022-09-22 DIAGNOSIS — D509 Iron deficiency anemia, unspecified: Secondary | ICD-10-CM | POA: Diagnosis not present

## 2022-09-22 DIAGNOSIS — Z992 Dependence on renal dialysis: Secondary | ICD-10-CM | POA: Diagnosis not present

## 2022-09-22 DIAGNOSIS — D631 Anemia in chronic kidney disease: Secondary | ICD-10-CM | POA: Diagnosis not present

## 2022-09-22 DIAGNOSIS — E119 Type 2 diabetes mellitus without complications: Secondary | ICD-10-CM | POA: Diagnosis not present

## 2022-09-22 DIAGNOSIS — L299 Pruritus, unspecified: Secondary | ICD-10-CM | POA: Diagnosis not present

## 2022-09-25 DIAGNOSIS — N186 End stage renal disease: Secondary | ICD-10-CM | POA: Diagnosis not present

## 2022-09-25 DIAGNOSIS — E119 Type 2 diabetes mellitus without complications: Secondary | ICD-10-CM | POA: Diagnosis not present

## 2022-09-25 DIAGNOSIS — Z992 Dependence on renal dialysis: Secondary | ICD-10-CM | POA: Diagnosis not present

## 2022-09-25 DIAGNOSIS — L299 Pruritus, unspecified: Secondary | ICD-10-CM | POA: Diagnosis not present

## 2022-09-25 DIAGNOSIS — D631 Anemia in chronic kidney disease: Secondary | ICD-10-CM | POA: Diagnosis not present

## 2022-09-25 DIAGNOSIS — N2581 Secondary hyperparathyroidism of renal origin: Secondary | ICD-10-CM | POA: Diagnosis not present

## 2022-09-25 DIAGNOSIS — D509 Iron deficiency anemia, unspecified: Secondary | ICD-10-CM | POA: Diagnosis not present

## 2022-09-26 ENCOUNTER — Ambulatory Visit
Admission: RE | Admit: 2022-09-26 | Discharge: 2022-09-26 | Disposition: A | Discharge status: Home / Self Care | Payer: 59 | Source: Ambulatory Visit | Attending: Nurse Practitioner | Admitting: Nurse Practitioner

## 2022-09-26 DIAGNOSIS — K7460 Unspecified cirrhosis of liver: Secondary | ICD-10-CM | POA: Diagnosis not present

## 2022-09-26 DIAGNOSIS — K7469 Other cirrhosis of liver: Secondary | ICD-10-CM

## 2022-09-27 DIAGNOSIS — L299 Pruritus, unspecified: Secondary | ICD-10-CM | POA: Diagnosis not present

## 2022-09-27 DIAGNOSIS — Z992 Dependence on renal dialysis: Secondary | ICD-10-CM | POA: Diagnosis not present

## 2022-09-27 DIAGNOSIS — N2581 Secondary hyperparathyroidism of renal origin: Secondary | ICD-10-CM | POA: Diagnosis not present

## 2022-09-27 DIAGNOSIS — N186 End stage renal disease: Secondary | ICD-10-CM | POA: Diagnosis not present

## 2022-09-27 DIAGNOSIS — D631 Anemia in chronic kidney disease: Secondary | ICD-10-CM | POA: Diagnosis not present

## 2022-09-27 DIAGNOSIS — E119 Type 2 diabetes mellitus without complications: Secondary | ICD-10-CM | POA: Diagnosis not present

## 2022-09-27 DIAGNOSIS — D509 Iron deficiency anemia, unspecified: Secondary | ICD-10-CM | POA: Diagnosis not present

## 2022-09-28 ENCOUNTER — Ambulatory Visit (INDEPENDENT_AMBULATORY_CARE_PROVIDER_SITE_OTHER): Payer: 59 | Admitting: Family Medicine

## 2022-09-28 ENCOUNTER — Ambulatory Visit (HOSPITAL_BASED_OUTPATIENT_CLINIC_OR_DEPARTMENT_OTHER)
Admission: RE | Admit: 2022-09-28 | Discharge: 2022-09-28 | Disposition: A | Discharge status: Home / Self Care | Payer: 59 | Source: Ambulatory Visit | Attending: Family Medicine | Admitting: Family Medicine

## 2022-09-28 ENCOUNTER — Emergency Department (HOSPITAL_BASED_OUTPATIENT_CLINIC_OR_DEPARTMENT_OTHER): Payer: 59

## 2022-09-28 ENCOUNTER — Emergency Department (HOSPITAL_BASED_OUTPATIENT_CLINIC_OR_DEPARTMENT_OTHER)
Admission: EM | Admit: 2022-09-28 | Discharge: 2022-09-28 | Disposition: A | Discharge status: Home / Self Care | Payer: 59 | Attending: Emergency Medicine | Admitting: Emergency Medicine

## 2022-09-28 ENCOUNTER — Encounter (HOSPITAL_BASED_OUTPATIENT_CLINIC_OR_DEPARTMENT_OTHER): Payer: Self-pay

## 2022-09-28 ENCOUNTER — Other Ambulatory Visit: Payer: Self-pay

## 2022-09-28 ENCOUNTER — Encounter: Payer: Self-pay | Admitting: Family Medicine

## 2022-09-28 VITALS — BP 124/50 | HR 83 | Temp 97.2°F | Ht 59.0 in | Wt 131.0 lb

## 2022-09-28 DIAGNOSIS — Z992 Dependence on renal dialysis: Secondary | ICD-10-CM | POA: Diagnosis not present

## 2022-09-28 DIAGNOSIS — I5030 Unspecified diastolic (congestive) heart failure: Secondary | ICD-10-CM | POA: Diagnosis not present

## 2022-09-28 DIAGNOSIS — N186 End stage renal disease: Secondary | ICD-10-CM | POA: Insufficient documentation

## 2022-09-28 DIAGNOSIS — R0602 Shortness of breath: Secondary | ICD-10-CM | POA: Insufficient documentation

## 2022-09-28 DIAGNOSIS — I132 Hypertensive heart and chronic kidney disease with heart failure and with stage 5 chronic kidney disease, or end stage renal disease: Secondary | ICD-10-CM | POA: Diagnosis not present

## 2022-09-28 DIAGNOSIS — R051 Acute cough: Secondary | ICD-10-CM

## 2022-09-28 DIAGNOSIS — I272 Pulmonary hypertension, unspecified: Secondary | ICD-10-CM | POA: Diagnosis not present

## 2022-09-28 DIAGNOSIS — R778 Other specified abnormalities of plasma proteins: Secondary | ICD-10-CM | POA: Insufficient documentation

## 2022-09-28 DIAGNOSIS — R638 Other symptoms and signs concerning food and fluid intake: Secondary | ICD-10-CM | POA: Diagnosis not present

## 2022-09-28 DIAGNOSIS — I10 Essential (primary) hypertension: Secondary | ICD-10-CM | POA: Diagnosis not present

## 2022-09-28 DIAGNOSIS — R7989 Other specified abnormal findings of blood chemistry: Secondary | ICD-10-CM | POA: Insufficient documentation

## 2022-09-28 DIAGNOSIS — R059 Cough, unspecified: Secondary | ICD-10-CM | POA: Diagnosis not present

## 2022-09-28 LAB — BASIC METABOLIC PANEL
Anion gap: 13 (ref 5–15)
BUN: 23 mg/dL (ref 8–23)
CO2: 27 mmol/L (ref 22–32)
Calcium: 7.7 mg/dL — ABNORMAL LOW (ref 8.9–10.3)
Chloride: 94 mmol/L — ABNORMAL LOW (ref 98–111)
Creatinine, Ser: 6.73 mg/dL — ABNORMAL HIGH (ref 0.44–1.00)
GFR, Estimated: 6 mL/min — ABNORMAL LOW (ref >60)
Glucose, Bld: 123 mg/dL — ABNORMAL HIGH (ref 70–99)
Potassium: 3.5 mmol/L (ref 3.5–5.1)
Sodium: 134 mmol/L — ABNORMAL LOW (ref 135–145)

## 2022-09-28 LAB — CBC
HCT: 38 % (ref 36.0–46.0)
Hemoglobin: 11.9 g/dL — ABNORMAL LOW (ref 12.0–15.0)
MCH: 28.5 pg (ref 26.0–34.0)
MCHC: 31.3 g/dL (ref 30.0–36.0)
MCV: 90.9 fL (ref 80.0–100.0)
Platelets: 145 10*3/uL — ABNORMAL LOW (ref 150–400)
RBC: 4.18 MIL/uL (ref 3.87–5.11)
RDW: 25.1 % — ABNORMAL HIGH (ref 11.5–15.5)
WBC: 5.9 10*3/uL (ref 4.0–10.5)
nRBC: 0 % (ref 0.0–0.2)

## 2022-09-28 LAB — BRAIN NATRIURETIC PEPTIDE: B Natriuretic Peptide: 1552.2 pg/mL — ABNORMAL HIGH (ref 0.0–100.0)

## 2022-09-28 LAB — TROPONIN I (HIGH SENSITIVITY)
Troponin I (High Sensitivity): 60 ng/L — ABNORMAL HIGH (ref <18)
Troponin I (High Sensitivity): 58 ng/L — ABNORMAL HIGH (ref <18)

## 2022-09-28 MED ORDER — AMOXICILLIN-POT CLAVULANATE 875-125 MG PO TABS: 1.0000 | ORAL_TABLET | Freq: Two times a day (BID) | ORAL | 0 refills | Status: DC

## 2022-09-28 MED ORDER — OXYCODONE HCL 5 MG PO TABS
5.0000 mg | ORAL_TABLET | Freq: Once | ORAL | Status: AC
  Administered 2022-09-28: 5 mg via ORAL
  Filled 2022-09-28: qty 1

## 2022-09-28 MED ORDER — IPRATROPIUM-ALBUTEROL 0.5-2.5 (3) MG/3ML IN SOLN
3.0000 mL | Freq: Once | RESPIRATORY_TRACT | Status: AC
  Administered 2022-09-28: 3 mL via RESPIRATORY_TRACT

## 2022-09-28 MED ORDER — METHYLPREDNISOLONE 4 MG PO TABS: ORAL_TABLET | ORAL | 0 refills | Status: DC

## 2022-09-28 MED ORDER — HYDROCODONE BIT-HOMATROP MBR 5-1.5 MG/5ML PO SOLN: 5.0000 mL | Freq: Three times a day (TID) | ORAL | 0 refills | Status: AC | PRN

## 2022-09-28 NOTE — ED Provider Notes (Signed)
[place] EMERGENCY DEPARTMENT AT [facility] Provider Note   CSN: [id number] Arrival date & time: 09/28/22  1814     History Chief Complaint  Patient presents with   Cough    HPI Morgan Clay is a 77 y.o. female presenting for cough and volume overload.  She is an ESRD patient with diastolic heart failure.  She has been compliant with dialysis but has had an intermittent cough.  Had 2.7 L taken off at dialysis yesterday and felt better but today has had recurrence of shortness of breath. She is ambulatory tolerating p.o. intake in no acute distress.  Satting 99% on room air. She denies fevers chills nausea vomiting syncope or shortness of breath at this time. States that she does not know why she was sent in by her PCP as she has dialysis scheduled for the morning already.   Patient's recorded medical, surgical, social, medication list and allergies were reviewed in the Snapshot window as part of the initial history.   Review of Systems   Review of Systems  Constitutional:  Negative for chills and fever.  HENT:  Negative for ear pain and sore throat.   Eyes:  Negative for pain and visual disturbance.  Respiratory:  Negative for cough and shortness of breath.   Cardiovascular:  Negative for chest pain and palpitations.  Gastrointestinal:  Negative for abdominal pain and vomiting.  Genitourinary:  Negative for dysuria and hematuria.  Musculoskeletal:  Negative for arthralgias and back pain.  Skin:  Negative for color change and rash.  Neurological:  Negative for seizures and syncope.  All other systems reviewed and are negative.   Physical Exam Updated Vital Signs BP (!) 147/117 (BP Location: Right Arm)   Pulse 87   Temp 98.5 F (36.9 C) (Oral)   Resp (!) 22   Ht 4\' 11"  (1.499 m)   Wt 59.4 kg   SpO2 94%   BMI 26.46 kg/m  Physical Exam Vitals and nursing note reviewed.  Constitutional:      General: She is not in acute distress.    Appearance: She  is well-developed.  HENT:     Head: Normocephalic and atraumatic.  Eyes:     Conjunctiva/sclera: Conjunctivae normal.  Cardiovascular:     Rate and Rhythm: Normal rate and regular rhythm.     Heart sounds: No murmur heard. Pulmonary:     Effort: Pulmonary effort is normal. No respiratory distress.     Breath sounds: Normal breath sounds.  Abdominal:     General: There is no distension.     Palpations: Abdomen is soft.     Tenderness: There is no abdominal tenderness. There is no right CVA tenderness or left CVA tenderness.  Musculoskeletal:        General: No swelling or tenderness. Normal range of motion.     Cervical back: Neck supple.  Skin:    General: Skin is warm and dry.  Neurological:     General: No focal deficit present.     Mental Status: She is alert and oriented to person, place, and time. Mental status is at baseline.     Cranial Nerves: No cranial nerve deficit.      ED Course/ Medical Decision Making/ A&P    Procedures Procedures   Medications Ordered in ED Medications  oxyCODONE (Oxy IR/ROXICODONE) immediate release tablet 5 mg (has no administration in time range)    Medical Decision Making:    Morgan Clay is a 77 y.o. female who  presented to the ED today with abnormal chest x-ray detailed above.     Complete initial physical exam performed, notably the patient  was in no acute distress ambulatory tolerating p.o. intake.      Reviewed and confirmed nursing documentation for past medical history, family history, social history.    Initial Assessment:   With the patient's presentation of abnormal chest x-ray, most likely diagnosis is chronic volume overload from ESRD and heart failure combined. Other diagnoses were considered including (but not limited to) acute exacerbation of these diseases, pneumonia, pneumothorax. These are considered less likely due to history of present illness and physical exam findings.   ACS also considered grossly less  likely This most consistent with an acute life-threatening illness   Initial Plan:  Screening labs including CBC and Metabolic panel to evaluate for infectious or metabolic etiology of disease.  CXR to evaluate for structural/infectious intrathoracic pathology.  Troponin/BNP/EKG to evaluate for cardiac pathology. Objective evaluation as below reviewed with plan for close reassessment  Initial Study Results:   Laboratory  All laboratory results reviewed without evidence of clinically relevant pathology.   Exceptions include: Elevated BNP, elevated troponin stable at her baseline, BMP consistent with ESRD  EKG EKG was reviewed independently. Rate, rhythm, axis, intervals all examined and without medically relevant abnormality. ST segments without concerns for elevations.    Radiology  All images reviewed independently. Agree with radiology report at this time.   DG Chest Portable 1 View  Result Date: 09/28/2022 CLINICAL DATA:  Short of breath and cough for 1 week, concern for CHF EXAM: PORTABLE CHEST 1 VIEW COMPARISON:  09/28/2022 FINDINGS: Single frontal view of the chest demonstrates stable multi lead pacer/AICD and right dialysis catheter. Cardiac silhouette remains enlarged. Persistent central vascular prominence with bilateral interstitial and ground-glass opacities. No effusion or pneumothorax. No acute bony abnormalities. IMPRESSION: 1. Stable findings most consistent with congestive heart failure. No change in volume status. Electronically Signed   By: Sharlet Salina M.D.   On: 09/28/2022 19:28   DG Chest 2 View  Result Date: 09/28/2022 CLINICAL DATA:  Two week history of cough and shortness of breath EXAM: CHEST - 2 VIEW COMPARISON:  06/07/2022 FINDINGS: Chronic cardiomegaly and aortic atherosclerosis. Pacemaker/AICD with leads apparently unchanged. Right internal jugular central venous catheter tip in the SVC above the right atrium. Radiopaque sheath like density projects over the  heart on the frontal view and appears to be extra thoracic on the lateral view, nature uncertain. There is pulmonary venous hypertension with mild interstitial edema. No evidence of consolidation, collapse or visible effusion. Mild pneumonia not excluded, but congestive heart failure/fluid overload is favored. IMPRESSION: 1. Congestive heart failure/fluid overload pattern with pulmonary venous hypertension and mild interstitial edema. Mild pneumonia not excluded, but no evidence of dense consolidation, lobar collapse or effusion. 2. Chronic cardiomegaly and aortic atherosclerosis. Electronically Signed   By: Paulina Fusi M.D.   On: 09/28/2022 16:26     Final Assessment and Plan:   Patient observed for 4 hours in the emergency room. Offered admission for dialysis and stabilization but given relatively well appearance, patient would feel more comfortable outpatient care management.  She is requesting a dose of her home pain medication prior to discharge but states that she would strongly prefer being discharged to follow-up with her dialysis clinic in the morning.  Given stability of findings today I believe this is reasonable.  Patient discharged with plan to follow-up with her dialysis center as prior scheduled.  Disposition:  I have considered need for hospitalization, however, considering all of the above, I believe this patient is stable for discharge at this time.  Patient/family educated about specific return precautions for given chief complaint and symptoms.  Patient/family educated about follow-up with PCP.     Patient/family expressed understanding of return precautions and need for follow-up. Patient spoken to regarding all imaging and laboratory results and appropriate follow up for these results. All education provided in verbal form with additional information in written form. Time was allowed for answering of patient questions. Patient discharged.    Emergency Department Medication  Summary:   Medications  oxyCODONE (Oxy IR/ROXICODONE) immediate release tablet 5 mg (has no administration in time range)         Clinical Impression:  1. Acute cough      Discharge   Final Clinical Impression(s) / ED Diagnoses Final diagnoses:  Acute cough    Rx / DC Orders ED Discharge Orders     None         Glyn Ade, MD 09/28/22 2158

## 2022-09-28 NOTE — Discharge Instructions (Signed)
You are seen today for volume overload.  Your x-ray is consistent with volume overload.  Your lab work has no other focal pathology though.  We have discussed transferring you to [facility] for dialysis but you feel comfortable dialyzing in the morning with your normal schedule. Please follow-up your primary care provider as regularly scheduled return to the emergency department with any interval worsening.

## 2022-09-28 NOTE — Progress Notes (Signed)
Acute Office Visit  Subjective:     Patient ID: Morgan Clay, female    DOB: 1945-06-04, 77 y.o.   MRN: [id number]  Chief Complaint  Patient presents with   Cough     Patient is in today for cough.   Patient reports she started feeling poorly about 2 weeks ago with cold/allergy symptoms that has progressively worsened. Current symptoms include nonproductive cough, body aches, headache, sinus pressure, chills, nasal congestion, abdominal soreness from coughing, poor sleep from coughing. States she has tried her albuterol inhaler without much relief. She denies chest pain, dyspnea, wheezing, fevers, ear pain, GI/GU symptoms.     All review of systems negative except what is listed in the HPI      Objective:    BP (!) 124/50   Pulse 83   Temp (!) 97.2 F (36.2 C) (Temporal)   Ht 4\' 11"  (1.499 m)   Wt 131 lb (59.4 kg)   SpO2 100%   BMI 26.46 kg/m    Physical Exam Vitals reviewed.  Constitutional:      General: She is not in acute distress.    Appearance: Normal appearance. She is ill-appearing.  Cardiovascular:     Rate and Rhythm: Normal rate and regular rhythm.     Pulses: Normal pulses.     Heart sounds: Normal heart sounds.  Pulmonary:     Effort: Pulmonary effort is normal. No respiratory distress.     Breath sounds: Wheezing and rhonchi present.  Skin:    General: Skin is warm and dry.  Neurological:     Mental Status: She is alert and oriented to person, place, and time.  Psychiatric:        Mood and Affect: Mood normal.        Behavior: Behavior normal.        Thought Content: Thought content normal.        Judgment: Judgment normal.     No results found for any visits on 09/28/22.      Assessment & Plan:   Problem List Items Addressed This Visit     Cough - Primary   Relevant Medications   amoxicillin-clavulanate (AUGMENTIN) 875-125 MG tablet   HYDROcodone bit-homatropine (HYCODAN) 5-1.5 MG/5ML syrup   methylPREDNISolone (MEDROL) 4  MG tablet   Other Relevant Orders   DG Chest 2 View (Completed)  DuoNeb breathing treatment in office today Adding Augmentin, Medrol Dosepak, and Hycodan cough syrup.  Chest xray today given presentation and breath sounds. Will update with results and any changes to plan.  Continue supportive measures including rest, hydration, humidifier use, steam showers, warm compresses to sinuses, warm liquids with lemon and honey, and over-the-counter cough, cold, and analgesics as needed.  Patient aware of signs/symptoms requiring further/urgent evaluation.   Meds ordered this encounter  Medications   amoxicillin-clavulanate (AUGMENTIN) 875-125 MG tablet    Sig: Take 1 tablet by mouth 2 (two) times daily.    Dispense:  20 tablet    Refill:  0    Order Specific Question:   Supervising Provider    Answer:   Danise Edge A [[id number]]   HYDROcodone bit-homatropine (HYCODAN) 5-1.5 MG/5ML syrup    Sig: Take 5 mLs by mouth every 8 (eight) hours as needed for up to 5 days for cough.    Dispense:  75 mL    Refill:  0    Order Specific Question:   Supervising Provider    Answer:   Danise Edge A [[id number]]  methylPREDNISolone (MEDROL) 4 MG tablet    Sig: Standard 6 day taper    Dispense:  21 tablet    Refill:  0    Order Specific Question:   Supervising Provider    Answer:   Danise Edge A [[id number]]   ipratropium-albuterol (DUONEB) 0.5-2.5 (3) MG/3ML nebulizer solution 3 mL    Return if symptoms worsen or fail to improve.  Clayborne [name], NP

## 2022-09-28 NOTE — ED Notes (Signed)
Redrew green top and sent to lab

## 2022-09-28 NOTE — ED Triage Notes (Addendum)
Sent here by PCP stating she has fluid on her lungs and CHF. Been coughing x 1 week.  States pain in stomach from coughing so much.   Dialysis patient, MWF. Last treatment yesterday.

## 2022-09-28 NOTE — ED Notes (Signed)
10cc saline administered through IV

## 2022-09-28 NOTE — Patient Instructions (Signed)
DuoNeb breathing treatment in office today Adding Augmentin, Medrol Dosepak, and Hycodan cough syrup.  Chest xray today given presentation and breath sounds. Will update with results and any changes to plan.  Continue supportive measures including rest, hydration, humidifier use, steam showers, warm compresses to sinuses, warm liquids with lemon and honey, and over-the-counter cough, cold, and analgesics as needed.   Please contact office for follow-up if symptoms do not improve or worsen. Seek emergency care if symptoms become severe.

## 2022-09-29 DIAGNOSIS — D631 Anemia in chronic kidney disease: Secondary | ICD-10-CM | POA: Diagnosis not present

## 2022-09-29 DIAGNOSIS — L299 Pruritus, unspecified: Secondary | ICD-10-CM | POA: Diagnosis not present

## 2022-09-29 DIAGNOSIS — N2581 Secondary hyperparathyroidism of renal origin: Secondary | ICD-10-CM | POA: Diagnosis not present

## 2022-09-29 DIAGNOSIS — Z992 Dependence on renal dialysis: Secondary | ICD-10-CM | POA: Diagnosis not present

## 2022-09-29 DIAGNOSIS — D509 Iron deficiency anemia, unspecified: Secondary | ICD-10-CM | POA: Diagnosis not present

## 2022-09-29 DIAGNOSIS — E119 Type 2 diabetes mellitus without complications: Secondary | ICD-10-CM | POA: Diagnosis not present

## 2022-09-29 DIAGNOSIS — N186 End stage renal disease: Secondary | ICD-10-CM | POA: Diagnosis not present

## 2022-09-30 ENCOUNTER — Emergency Department (HOSPITAL_BASED_OUTPATIENT_CLINIC_OR_DEPARTMENT_OTHER)
Admission: EM | Admit: 2022-09-30 | Discharge: 2022-09-30 | Disposition: A | Discharge status: Home / Self Care | Payer: 59 | Source: Home / Self Care | Attending: Emergency Medicine | Admitting: Emergency Medicine

## 2022-09-30 ENCOUNTER — Encounter (HOSPITAL_BASED_OUTPATIENT_CLINIC_OR_DEPARTMENT_OTHER): Payer: Self-pay | Admitting: Emergency Medicine

## 2022-09-30 ENCOUNTER — Other Ambulatory Visit: Payer: Self-pay

## 2022-09-30 ENCOUNTER — Emergency Department (HOSPITAL_BASED_OUTPATIENT_CLINIC_OR_DEPARTMENT_OTHER): Payer: 59

## 2022-09-30 DIAGNOSIS — J95811 Postprocedural pneumothorax: Secondary | ICD-10-CM | POA: Diagnosis not present

## 2022-09-30 DIAGNOSIS — J9811 Atelectasis: Secondary | ICD-10-CM | POA: Diagnosis not present

## 2022-09-30 DIAGNOSIS — D631 Anemia in chronic kidney disease: Secondary | ICD-10-CM | POA: Diagnosis not present

## 2022-09-30 DIAGNOSIS — K7200 Acute and subacute hepatic failure without coma: Secondary | ICD-10-CM | POA: Diagnosis not present

## 2022-09-30 DIAGNOSIS — Z7901 Long term (current) use of anticoagulants: Secondary | ICD-10-CM | POA: Insufficient documentation

## 2022-09-30 DIAGNOSIS — I110 Hypertensive heart disease with heart failure: Secondary | ICD-10-CM | POA: Diagnosis not present

## 2022-09-30 DIAGNOSIS — I272 Pulmonary hypertension, unspecified: Secondary | ICD-10-CM | POA: Diagnosis not present

## 2022-09-30 DIAGNOSIS — R109 Unspecified abdominal pain: Secondary | ICD-10-CM | POA: Diagnosis not present

## 2022-09-30 DIAGNOSIS — I5082 Biventricular heart failure: Secondary | ICD-10-CM | POA: Diagnosis not present

## 2022-09-30 DIAGNOSIS — Z992 Dependence on renal dialysis: Secondary | ICD-10-CM | POA: Insufficient documentation

## 2022-09-30 DIAGNOSIS — A419 Sepsis, unspecified organism: Secondary | ICD-10-CM | POA: Diagnosis not present

## 2022-09-30 DIAGNOSIS — J1289 Other viral pneumonia: Secondary | ICD-10-CM | POA: Diagnosis not present

## 2022-09-30 DIAGNOSIS — R6521 Severe sepsis with septic shock: Secondary | ICD-10-CM | POA: Diagnosis not present

## 2022-09-30 DIAGNOSIS — B9789 Other viral agents as the cause of diseases classified elsewhere: Secondary | ICD-10-CM | POA: Diagnosis not present

## 2022-09-30 DIAGNOSIS — I5043 Acute on chronic combined systolic (congestive) and diastolic (congestive) heart failure: Secondary | ICD-10-CM | POA: Diagnosis not present

## 2022-09-30 DIAGNOSIS — J441 Chronic obstructive pulmonary disease with (acute) exacerbation: Secondary | ICD-10-CM | POA: Diagnosis not present

## 2022-09-30 DIAGNOSIS — N186 End stage renal disease: Secondary | ICD-10-CM | POA: Insufficient documentation

## 2022-09-30 DIAGNOSIS — N179 Acute kidney failure, unspecified: Secondary | ICD-10-CM | POA: Diagnosis not present

## 2022-09-30 DIAGNOSIS — E039 Hypothyroidism, unspecified: Secondary | ICD-10-CM | POA: Diagnosis not present

## 2022-09-30 DIAGNOSIS — R570 Cardiogenic shock: Secondary | ICD-10-CM | POA: Diagnosis not present

## 2022-09-30 DIAGNOSIS — E11649 Type 2 diabetes mellitus with hypoglycemia without coma: Secondary | ICD-10-CM | POA: Diagnosis not present

## 2022-09-30 DIAGNOSIS — R221 Localized swelling, mass and lump, neck: Secondary | ICD-10-CM | POA: Diagnosis not present

## 2022-09-30 DIAGNOSIS — J069 Acute upper respiratory infection, unspecified: Secondary | ICD-10-CM | POA: Insufficient documentation

## 2022-09-30 DIAGNOSIS — I214 Non-ST elevation (NSTEMI) myocardial infarction: Secondary | ICD-10-CM | POA: Diagnosis not present

## 2022-09-30 DIAGNOSIS — I509 Heart failure, unspecified: Secondary | ICD-10-CM | POA: Insufficient documentation

## 2022-09-30 DIAGNOSIS — I4891 Unspecified atrial fibrillation: Secondary | ICD-10-CM | POA: Insufficient documentation

## 2022-09-30 DIAGNOSIS — G9341 Metabolic encephalopathy: Secondary | ICD-10-CM | POA: Diagnosis not present

## 2022-09-30 DIAGNOSIS — J9602 Acute respiratory failure with hypercapnia: Secondary | ICD-10-CM | POA: Diagnosis not present

## 2022-09-30 DIAGNOSIS — R0602 Shortness of breath: Secondary | ICD-10-CM | POA: Diagnosis not present

## 2022-09-30 DIAGNOSIS — Z9049 Acquired absence of other specified parts of digestive tract: Secondary | ICD-10-CM | POA: Diagnosis not present

## 2022-09-30 DIAGNOSIS — Z1152 Encounter for screening for COVID-19: Secondary | ICD-10-CM | POA: Diagnosis not present

## 2022-09-30 DIAGNOSIS — I8510 Secondary esophageal varices without bleeding: Secondary | ICD-10-CM | POA: Diagnosis not present

## 2022-09-30 DIAGNOSIS — J45901 Unspecified asthma with (acute) exacerbation: Secondary | ICD-10-CM | POA: Diagnosis not present

## 2022-09-30 DIAGNOSIS — R059 Cough, unspecified: Secondary | ICD-10-CM | POA: Diagnosis not present

## 2022-09-30 DIAGNOSIS — E872 Acidosis, unspecified: Secondary | ICD-10-CM | POA: Diagnosis not present

## 2022-09-30 DIAGNOSIS — J9601 Acute respiratory failure with hypoxia: Secondary | ICD-10-CM | POA: Diagnosis not present

## 2022-09-30 DIAGNOSIS — M96A4 Flail chest associated with chest compression and cardiopulmonary resuscitation: Secondary | ICD-10-CM | POA: Diagnosis not present

## 2022-09-30 DIAGNOSIS — D6959 Other secondary thrombocytopenia: Secondary | ICD-10-CM | POA: Diagnosis not present

## 2022-09-30 LAB — CBC WITH DIFFERENTIAL/PLATELET
Abs Immature Granulocytes: 0.02 10*3/uL (ref 0.00–0.07)
Basophils Absolute: 0 10*3/uL (ref 0.0–0.1)
Basophils Relative: 1 %
Eosinophils Absolute: 0 10*3/uL (ref 0.0–0.5)
Eosinophils Relative: 0 %
HCT: 37.6 % (ref 36.0–46.0)
Hemoglobin: 11.9 g/dL — ABNORMAL LOW (ref 12.0–15.0)
Immature Granulocytes: 0 %
Lymphocytes Relative: 16 %
Lymphs Abs: 0.8 10*3/uL (ref 0.7–4.0)
MCH: 28.5 pg (ref 26.0–34.0)
MCHC: 31.6 g/dL (ref 30.0–36.0)
MCV: 90 fL (ref 80.0–100.0)
Monocytes Absolute: 0.4 10*3/uL (ref 0.1–1.0)
Monocytes Relative: 9 %
Neutro Abs: 3.7 10*3/uL (ref 1.7–7.7)
Neutrophils Relative %: 74 %
Platelets: 140 10*3/uL — ABNORMAL LOW (ref 150–400)
RBC: 4.18 MIL/uL (ref 3.87–5.11)
RDW: 24.3 % — ABNORMAL HIGH (ref 11.5–15.5)
Smear Review: NORMAL
WBC: 5 10*3/uL (ref 4.0–10.5)
nRBC: 0 % (ref 0.0–0.2)

## 2022-09-30 LAB — BRAIN NATRIURETIC PEPTIDE: B Natriuretic Peptide: 1538.3 pg/mL — ABNORMAL HIGH (ref 0.0–100.0)

## 2022-09-30 LAB — COMPREHENSIVE METABOLIC PANEL
ALT: 13 U/L (ref 0–44)
AST: 31 U/L (ref 15–41)
Albumin: 4.2 g/dL (ref 3.5–5.0)
Alkaline Phosphatase: 85 U/L (ref 38–126)
Anion gap: 15 (ref 5–15)
BUN: 18 mg/dL (ref 8–23)
CO2: 25 mmol/L (ref 22–32)
Calcium: 7.9 mg/dL — ABNORMAL LOW (ref 8.9–10.3)
Chloride: 90 mmol/L — ABNORMAL LOW (ref 98–111)
Creatinine, Ser: 5.95 mg/dL — ABNORMAL HIGH (ref 0.44–1.00)
GFR, Estimated: 7 mL/min — ABNORMAL LOW (ref >60)
Glucose, Bld: 148 mg/dL — ABNORMAL HIGH (ref 70–99)
Potassium: 4.2 mmol/L (ref 3.5–5.1)
Sodium: 130 mmol/L — ABNORMAL LOW (ref 135–145)
Total Bilirubin: 0.8 mg/dL (ref 0.3–1.2)
Total Protein: 9.1 g/dL — ABNORMAL HIGH (ref 6.5–8.1)

## 2022-09-30 LAB — TROPONIN I (HIGH SENSITIVITY): Troponin I (High Sensitivity): 43 ng/L — ABNORMAL HIGH (ref <18)

## 2022-09-30 MED ORDER — BENZONATATE 100 MG PO CAPS
100.0000 mg | ORAL_CAPSULE | Freq: Once | ORAL | Status: AC
  Administered 2022-09-30: 100 mg via ORAL
  Filled 2022-09-30: qty 1

## 2022-09-30 MED ORDER — IPRATROPIUM-ALBUTEROL 0.5-2.5 (3) MG/3ML IN SOLN
3.0000 mL | Freq: Once | RESPIRATORY_TRACT | Status: AC
  Administered 2022-09-30: 3 mL via RESPIRATORY_TRACT
  Filled 2022-09-30: qty 3

## 2022-09-30 MED ORDER — METHYLPREDNISOLONE SODIUM SUCC 125 MG IJ SOLR
125.0000 mg | Freq: Once | INTRAMUSCULAR | Status: AC
  Administered 2022-09-30: 125 mg via INTRAVENOUS
  Filled 2022-09-30: qty 2

## 2022-09-30 NOTE — ED Provider Notes (Signed)
[place] [facility] AT [facility] Provider Note   CSN: [id number] Arrival date & time: 09/30/22  1422     History  Chief Complaint  Patient presents with   Cough    Morgan Clay is a 77 y.o. female.  Patient here with ongoing cough.  Morgan Clay is on day 2 of antibiotics, steroids, breathing treatments for what sounds like presumed bronchitis.  Morgan Clay has a history of end-stage renal disease on hemodialysis.  Has not missed any sessions.  Went yesterday.  Morgan Clay is on blood thinners for A-fib.  History of heart failure as well and pulmonary hypertension.  Morgan Clay states that cough has been really painful.  Really causing her throat to hurt.  Morgan Clay felt like her throat was closing off Morgan Clay went to come back for evaluation.  Morgan Clay denies any fevers or chills.  Denies any leg swelling.  No chest pain or shortness of breath or abdominal pain.  The history is provided by the patient.       Home Medications Prior to Admission medications   Medication Sig Start Date End Date Taking? Authorizing Provider  albuterol (VENTOLIN HFA) 108 (90 Base) MCG/ACT inhaler Inhale 2 puffs into the lungs every 6 (six) hours as needed for wheezing or shortness of breath. 04/07/22   Hunsucker, Lesia Sago, MD  amiodarone (PACERONE) 200 MG tablet Take 1 tablet by mouth once daily 08/30/22   Saguier, Ramon Dredge, PA-C  ammonium lactate (LAC-HYDRIN) 12 % lotion Apply daily on forearms. 09/05/22   Sharlene Dory, DO  amoxicillin-clavulanate (AUGMENTIN) 875-125 MG tablet Take 1 tablet by mouth 2 (two) times daily. 09/28/22   Clayborne [name], NP  apixaban (ELIQUIS) 2.5 MG TABS tablet Take 1 tablet (2.5 mg total) by mouth 2 (two) times daily. 03/11/22   Pokhrel, Rebekah Chesterfield, MD  cyclobenzaprine (FLEXERIL) 5 MG tablet Take 5 mg by mouth at bedtime as needed for muscle spasms. 12/07/21   [provider]  gabapentin (NEURONTIN) 300 MG capsule Take 1 capsule (300 mg total) by mouth daily. Fill one rx. Computer may  have sent over 2 Patient taking differently: Take 300 mg by mouth daily. 01/10/22   Saguier, Ramon Dredge, PA-C  HYDROcodone bit-homatropine (HYCODAN) 5-1.5 MG/5ML syrup Take 5 mLs by mouth every 8 (eight) hours as needed for up to 5 days for cough. 09/28/22 10/03/22  Clayborne [name], NP  ipratropium (ATROVENT HFA) 17 MCG/ACT inhaler Inhale 2 puffs into the lungs every 6 (six) hours as needed for wheezing. 03/24/22   Saguier, Ramon Dredge, PA-C  levothyroxine (SYNTHROID) 125 MCG tablet Take 1 tablet (125 mcg total) by mouth daily before breakfast. 01/16/22   Saguier, Ramon Dredge, PA-C  lidocaine-prilocaine (EMLA) cream Apply 1 Application topically as needed.    [provider]  methylPREDNISolone (MEDROL) 4 MG tablet Standard 6 day taper 09/28/22   Clayborne [name], NP  nitroGLYCERIN (NITROSTAT) 0.4 MG SL tablet Place 1 tablet (0.4 mg total) under the tongue every 5 (five) minutes as needed for chest pain. Patient not taking: Reported on 09/28/2022 10/27/21   Leroy Sea, MD  triamcinolone cream (KENALOG) 0.1 % Apply to shins twice dialy for 7 days. 09/05/22   Sharlene Dory, DO  Vitamin D, Ergocalciferol, (DRISDOL) 1.25 MG (50000 UNIT) CAPS capsule Take 1 capsule (50,000 Units total) by mouth every 7 (seven) days. 09/06/22   Sharlene Dory, DO      Allergies    Patient has no known allergies.    Review of Systems  Review of Systems  Physical Exam Updated Vital Signs BP (!) 167/56   Pulse 79   Temp 97.6 F (36.4 C)   Resp (!) 21   Ht 4\' 11"  (1.499 m)   Wt 59.4 kg   SpO2 94%   BMI 26.46 kg/m  Physical Exam Vitals and nursing note reviewed.  Constitutional:      General: Morgan Clay is not in acute distress.    Appearance: Morgan Clay is well-developed. Morgan Clay is not ill-appearing.  HENT:     Head: Normocephalic and atraumatic.     Nose: Nose normal.     Mouth/Throat:     Mouth: Mucous membranes are moist.  Eyes:     Extraocular Movements: Extraocular movements intact.      Conjunctiva/sclera: Conjunctivae normal.     Pupils: Pupils are equal, round, and reactive to light.  Cardiovascular:     Rate and Rhythm: Normal rate and regular rhythm.     Pulses: Normal pulses.     Heart sounds: Normal heart sounds. No murmur heard. Pulmonary:     Effort: Pulmonary effort is normal. No respiratory distress.     Breath sounds: Normal breath sounds.  Abdominal:     General: Abdomen is flat.     Palpations: Abdomen is soft.     Tenderness: There is no abdominal tenderness.  Musculoskeletal:        General: No swelling.     Cervical back: Normal range of motion and neck supple.     Right lower leg: No edema.     Left lower leg: No edema.  Skin:    General: Skin is warm and dry.     Capillary Refill: Capillary refill takes less than 2 seconds.  Neurological:     General: No focal deficit present.     Mental Status: Morgan Clay is alert.  Psychiatric:        Mood and Affect: Mood normal.     ED Results / Procedures / Treatments   Labs (all labs ordered are listed, but only abnormal results are displayed) Labs Reviewed  CBC WITH DIFFERENTIAL/PLATELET - Abnormal; Notable for the following components:      Result Value   Hemoglobin 11.9 (*)    RDW 24.3 (*)    Platelets 140 (*)    All other components within normal limits  COMPREHENSIVE METABOLIC PANEL - Abnormal; Notable for the following components:   Sodium 130 (*)    Chloride 90 (*)    Glucose, Bld 148 (*)    Creatinine, Ser 5.95 (*)    Calcium 7.9 (*)    Total Protein 9.1 (*)    GFR, Estimated 7 (*)    All other components within normal limits  BRAIN NATRIURETIC PEPTIDE - Abnormal; Notable for the following components:   B Natriuretic Peptide 1,538.3 (*)    All other components within normal limits  TROPONIN I (HIGH SENSITIVITY) - Abnormal; Notable for the following components:   Troponin I (High Sensitivity) 43 (*)    All other components within normal limits    EKG EKG  Interpretation  Date/Time:  Saturday Sep 30 2022 14:48:59 EDT Ventricular Rate:  83 PR Interval:  128 QRS Duration: 154 QT Interval:  490 QTC Calculation: 576 R Axis:   -87 Text Interpretation: Sinus rhythm IVCD, consider atypical RBBB Confirmed by Virgina Norfolk ([id number]) on 09/30/2022 2:50:56 PM  Radiology DG Chest Portable 1 View  Result Date: 09/30/2022 CLINICAL DATA:  Continued cough. Was seen 2 days ago for the same.  Had dialysis yesterday. EXAM: PORTABLE CHEST 1 VIEW COMPARISON:  Chest radiographs 09/28/2022 (multiple studies) and 06/07/2022 FINDINGS: Right internal jugular dual-lumen central venous catheter is again seen with tip overlying the central superior vena cava. Cardiac silhouette is moderately to markedly enlarged, unchanged. Moderate atherosclerotic calcifications overlying the aortic arch. Left chest wall cardiac AICD with leads overlying the right atrium, right ventricle, and coronary sinus. There is again mild central vascular prominence. No overt pulmonary edema. No pleural effusion or pneumothorax. No acute skeletal abnormality. IMPRESSION: Cardiomegaly with mild central vascular prominence, unchanged. No overt pulmonary edema. No significant change from prior exam. Electronically Signed   By: Neita Garnet M.D.   On: 09/30/2022 15:33   DG Chest Portable 1 View  Result Date: 09/28/2022 CLINICAL DATA:  Short of breath and cough for 1 week, concern for CHF EXAM: PORTABLE CHEST 1 VIEW COMPARISON:  09/28/2022 FINDINGS: Single frontal view of the chest demonstrates stable multi lead pacer/AICD and right dialysis catheter. Cardiac silhouette remains enlarged. Persistent central vascular prominence with bilateral interstitial and ground-glass opacities. No effusion or pneumothorax. No acute bony abnormalities. IMPRESSION: 1. Stable findings most consistent with congestive heart failure. No change in volume status. Electronically Signed   By: Sharlet Salina M.D.   On: 09/28/2022 19:28    DG Chest 2 View  Result Date: 09/28/2022 CLINICAL DATA:  Two week history of cough and shortness of breath EXAM: CHEST - 2 VIEW COMPARISON:  06/07/2022 FINDINGS: Chronic cardiomegaly and aortic atherosclerosis. Pacemaker/AICD with leads apparently unchanged. Right internal jugular central venous catheter tip in the SVC above the right atrium. Radiopaque sheath like density projects over the heart on the frontal view and appears to be extra thoracic on the lateral view, nature uncertain. There is pulmonary venous hypertension with mild interstitial edema. No evidence of consolidation, collapse or visible effusion. Mild pneumonia not excluded, but congestive heart failure/fluid overload is favored. IMPRESSION: 1. Congestive heart failure/fluid overload pattern with pulmonary venous hypertension and mild interstitial edema. Mild pneumonia not excluded, but no evidence of dense consolidation, lobar collapse or effusion. 2. Chronic cardiomegaly and aortic atherosclerosis. Electronically Signed   By: Paulina Fusi M.D.   On: 09/28/2022 16:26    Procedures Procedures    Medications Ordered in ED Medications  benzonatate (TESSALON) capsule 100 mg (100 mg Oral Given 09/30/22 1518)  ipratropium-albuterol (DUONEB) 0.5-2.5 (3) MG/3ML nebulizer solution 3 mL (3 mLs Nebulization Given 09/30/22 1524)  methylPREDNISolone sodium succinate (SOLU-MEDROL) 125 mg/2 mL injection 125 mg (125 mg Intravenous Given 09/30/22 1517)    ED Course/ Medical Decision Making/ A&P                             Medical Decision Making Amount and/or Complexity of Data Reviewed Labs: ordered. Radiology: ordered.  Risk Prescription drug management.   Romiyah Elk is here with ongoing cough.  History of end-stage renal disease on hemodialysis, heart failure, A-fib on Eliquis.  Normal vitals.  No fever.  No increased work of breathing.  No major signs of volume overload on exam.  Morgan Clay just tarted antibiotics, steroids,  breathing treatments, cough medicine yesterday.  Felt like her throat was very sore and cough is making it really painful and Morgan Clay is concerned and wanted reevaluation.  Morgan Clay overall has no signs of anaphylaxis on exam.  Morgan Clay has clear breath sounds.  Throat without any signs of infectious process.  Morgan Clay is on Augmentin, Hycodan, steroids.  However Morgan Clay  is only taking 1 day worth of these meds.  I do suspect from my review and interpretation of recent workup 2 days ago that this is likely a bronchitis.  Morgan Clay went to dialysis and has not missed any sessions.  Her troponin and BNP were baseline.  Chest x-ray was reassuring.  Will recheck these baseline labs and images and.  Will give a dose IV steroids, breathing treatment and cough medicine.  Differential diagnosis likely ongoing bronchitis but will make sure this has not progressed to some other process including volume overload, cardiac process.  Have no concern for PE.  Morgan Clay is on anticoagulation.  Per my review potation the labs, troponin and BNP are at baseline.  No significant leukocytosis or anemia.  Electrolytes unremarkable.  Chest x-ray with no acute findings and stable findings per radiology report from x-ray a couple days ago.  Morgan Clay is only 1 day into her treatment with steroids, antibiotics and cough medicine.  I think that this will continue to improve.  I do think Morgan Clay has likely a bronchitis.  History of the same.  Morgan Clay has albuterol inhaler as well.  Morgan Clay understands return precautions.  Discharged in good condition.  This chart was dictated using voice recognition software.  Despite best efforts to proofread,  errors can occur which can change the documentation meaning.         Final Clinical Impression(s) / ED Diagnoses Final diagnoses:  Viral URI with cough    Rx / DC Orders ED Discharge Orders     None         Virgina Norfolk, DO 09/30/22 1613

## 2022-09-30 NOTE — ED Notes (Signed)
D/c paperwork reviewed with pt, including follow up care.  No questions or concerns voiced at time of d/c. . Pt verbalized understanding, Wheeled by ED staff to ED exit, NAD.   

## 2022-09-30 NOTE — ED Triage Notes (Signed)
Pt returns for continued cough; was seen here 2d ago for same; had dialysis yesterday

## 2022-10-02 ENCOUNTER — Emergency Department (HOSPITAL_BASED_OUTPATIENT_CLINIC_OR_DEPARTMENT_OTHER): Payer: 59

## 2022-10-02 ENCOUNTER — Other Ambulatory Visit: Payer: Self-pay

## 2022-10-02 ENCOUNTER — Encounter (HOSPITAL_BASED_OUTPATIENT_CLINIC_OR_DEPARTMENT_OTHER): Payer: Self-pay | Admitting: Emergency Medicine

## 2022-10-02 ENCOUNTER — Inpatient Hospital Stay (HOSPITAL_BASED_OUTPATIENT_CLINIC_OR_DEPARTMENT_OTHER)
Admission: EM | Admit: 2022-10-02 | Discharge: 2022-11-06 | DRG: 004 | Disposition: E | Payer: 59 | Attending: Internal Medicine | Admitting: Internal Medicine

## 2022-10-02 DIAGNOSIS — B9710 Unspecified enterovirus as the cause of diseases classified elsewhere: Secondary | ICD-10-CM | POA: Diagnosis present

## 2022-10-02 DIAGNOSIS — E11649 Type 2 diabetes mellitus with hypoglycemia without coma: Secondary | ICD-10-CM | POA: Diagnosis not present

## 2022-10-02 DIAGNOSIS — M898X9 Other specified disorders of bone, unspecified site: Secondary | ICD-10-CM | POA: Diagnosis present

## 2022-10-02 DIAGNOSIS — I2510 Atherosclerotic heart disease of native coronary artery without angina pectoris: Secondary | ICD-10-CM | POA: Diagnosis not present

## 2022-10-02 DIAGNOSIS — R0602 Shortness of breath: Secondary | ICD-10-CM | POA: Diagnosis not present

## 2022-10-02 DIAGNOSIS — Z8719 Personal history of other diseases of the digestive system: Secondary | ICD-10-CM

## 2022-10-02 DIAGNOSIS — E1122 Type 2 diabetes mellitus with diabetic chronic kidney disease: Secondary | ICD-10-CM | POA: Diagnosis present

## 2022-10-02 DIAGNOSIS — J95811 Postprocedural pneumothorax: Secondary | ICD-10-CM | POA: Diagnosis not present

## 2022-10-02 DIAGNOSIS — Z95810 Presence of automatic (implantable) cardiac defibrillator: Secondary | ICD-10-CM

## 2022-10-02 DIAGNOSIS — R748 Abnormal levels of other serum enzymes: Secondary | ICD-10-CM | POA: Diagnosis not present

## 2022-10-02 DIAGNOSIS — Z8711 Personal history of peptic ulcer disease: Secondary | ICD-10-CM

## 2022-10-02 DIAGNOSIS — Z1152 Encounter for screening for COVID-19: Secondary | ICD-10-CM

## 2022-10-02 DIAGNOSIS — I110 Hypertensive heart disease with heart failure: Secondary | ICD-10-CM | POA: Diagnosis not present

## 2022-10-02 DIAGNOSIS — K7200 Acute and subacute hepatic failure without coma: Secondary | ICD-10-CM | POA: Diagnosis present

## 2022-10-02 DIAGNOSIS — E872 Acidosis, unspecified: Secondary | ICD-10-CM | POA: Diagnosis not present

## 2022-10-02 DIAGNOSIS — R918 Other nonspecific abnormal finding of lung field: Secondary | ICD-10-CM | POA: Diagnosis not present

## 2022-10-02 DIAGNOSIS — I50811 Acute right heart failure: Secondary | ICD-10-CM | POA: Diagnosis not present

## 2022-10-02 DIAGNOSIS — N186 End stage renal disease: Secondary | ICD-10-CM | POA: Diagnosis present

## 2022-10-02 DIAGNOSIS — I214 Non-ST elevation (NSTEMI) myocardial infarction: Secondary | ICD-10-CM | POA: Diagnosis not present

## 2022-10-02 DIAGNOSIS — K4090 Unilateral inguinal hernia, without obstruction or gangrene, not specified as recurrent: Secondary | ICD-10-CM | POA: Diagnosis present

## 2022-10-02 DIAGNOSIS — M8580 Other specified disorders of bone density and structure, unspecified site: Secondary | ICD-10-CM | POA: Diagnosis present

## 2022-10-02 DIAGNOSIS — I5082 Biventricular heart failure: Secondary | ICD-10-CM | POA: Diagnosis present

## 2022-10-02 DIAGNOSIS — I272 Pulmonary hypertension, unspecified: Secondary | ICD-10-CM | POA: Diagnosis present

## 2022-10-02 DIAGNOSIS — R7989 Other specified abnormal findings of blood chemistry: Secondary | ICD-10-CM | POA: Diagnosis not present

## 2022-10-02 DIAGNOSIS — G9341 Metabolic encephalopathy: Secondary | ICD-10-CM | POA: Diagnosis not present

## 2022-10-02 DIAGNOSIS — E874 Mixed disorder of acid-base balance: Secondary | ICD-10-CM | POA: Diagnosis not present

## 2022-10-02 DIAGNOSIS — J449 Chronic obstructive pulmonary disease, unspecified: Secondary | ICD-10-CM | POA: Diagnosis present

## 2022-10-02 DIAGNOSIS — J9811 Atelectasis: Secondary | ICD-10-CM | POA: Diagnosis not present

## 2022-10-02 DIAGNOSIS — E039 Hypothyroidism, unspecified: Secondary | ICD-10-CM | POA: Diagnosis present

## 2022-10-02 DIAGNOSIS — R570 Cardiogenic shock: Secondary | ICD-10-CM | POA: Diagnosis not present

## 2022-10-02 DIAGNOSIS — J151 Pneumonia due to Pseudomonas: Secondary | ICD-10-CM | POA: Diagnosis not present

## 2022-10-02 DIAGNOSIS — J690 Pneumonitis due to inhalation of food and vomit: Secondary | ICD-10-CM | POA: Diagnosis not present

## 2022-10-02 DIAGNOSIS — N2581 Secondary hyperparathyroidism of renal origin: Secondary | ICD-10-CM | POA: Diagnosis not present

## 2022-10-02 DIAGNOSIS — I700 Atherosclerosis of aorta: Secondary | ICD-10-CM | POA: Diagnosis not present

## 2022-10-02 DIAGNOSIS — A419 Sepsis, unspecified organism: Secondary | ICD-10-CM | POA: Diagnosis not present

## 2022-10-02 DIAGNOSIS — Z992 Dependence on renal dialysis: Secondary | ICD-10-CM

## 2022-10-02 DIAGNOSIS — J45901 Unspecified asthma with (acute) exacerbation: Secondary | ICD-10-CM | POA: Diagnosis present

## 2022-10-02 DIAGNOSIS — Z9049 Acquired absence of other specified parts of digestive tract: Secondary | ICD-10-CM | POA: Diagnosis not present

## 2022-10-02 DIAGNOSIS — I120 Hypertensive chronic kidney disease with stage 5 chronic kidney disease or end stage renal disease: Secondary | ICD-10-CM | POA: Diagnosis not present

## 2022-10-02 DIAGNOSIS — K761 Chronic passive congestion of liver: Secondary | ICD-10-CM | POA: Diagnosis present

## 2022-10-02 DIAGNOSIS — I351 Nonrheumatic aortic (valve) insufficiency: Secondary | ICD-10-CM | POA: Diagnosis present

## 2022-10-02 DIAGNOSIS — D631 Anemia in chronic kidney disease: Secondary | ICD-10-CM | POA: Diagnosis not present

## 2022-10-02 DIAGNOSIS — Z9911 Dependence on respirator [ventilator] status: Secondary | ICD-10-CM

## 2022-10-02 DIAGNOSIS — E119 Type 2 diabetes mellitus without complications: Secondary | ICD-10-CM | POA: Diagnosis not present

## 2022-10-02 DIAGNOSIS — D6959 Other secondary thrombocytopenia: Secondary | ICD-10-CM | POA: Diagnosis present

## 2022-10-02 DIAGNOSIS — J939 Pneumothorax, unspecified: Secondary | ICD-10-CM | POA: Diagnosis not present

## 2022-10-02 DIAGNOSIS — I5043 Acute on chronic combined systolic (congestive) and diastolic (congestive) heart failure: Secondary | ICD-10-CM | POA: Diagnosis not present

## 2022-10-02 DIAGNOSIS — R1110 Vomiting, unspecified: Secondary | ICD-10-CM | POA: Diagnosis not present

## 2022-10-02 DIAGNOSIS — J9601 Acute respiratory failure with hypoxia: Secondary | ICD-10-CM | POA: Diagnosis not present

## 2022-10-02 DIAGNOSIS — K7460 Unspecified cirrhosis of liver: Secondary | ICD-10-CM | POA: Diagnosis not present

## 2022-10-02 DIAGNOSIS — R109 Unspecified abdominal pain: Secondary | ICD-10-CM | POA: Diagnosis present

## 2022-10-02 DIAGNOSIS — J441 Chronic obstructive pulmonary disease with (acute) exacerbation: Secondary | ICD-10-CM | POA: Diagnosis not present

## 2022-10-02 DIAGNOSIS — I428 Other cardiomyopathies: Secondary | ICD-10-CM | POA: Diagnosis present

## 2022-10-02 DIAGNOSIS — J811 Chronic pulmonary edema: Secondary | ICD-10-CM | POA: Diagnosis not present

## 2022-10-02 DIAGNOSIS — I9589 Other hypotension: Secondary | ICD-10-CM | POA: Diagnosis present

## 2022-10-02 DIAGNOSIS — E1169 Type 2 diabetes mellitus with other specified complication: Secondary | ICD-10-CM | POA: Diagnosis present

## 2022-10-02 DIAGNOSIS — Z8619 Personal history of other infectious and parasitic diseases: Secondary | ICD-10-CM

## 2022-10-02 DIAGNOSIS — I4819 Other persistent atrial fibrillation: Secondary | ICD-10-CM | POA: Diagnosis present

## 2022-10-02 DIAGNOSIS — J9602 Acute respiratory failure with hypercapnia: Secondary | ICD-10-CM | POA: Diagnosis not present

## 2022-10-02 DIAGNOSIS — J1289 Other viral pneumonia: Secondary | ICD-10-CM | POA: Diagnosis present

## 2022-10-02 DIAGNOSIS — Y848 Other medical procedures as the cause of abnormal reaction of the patient, or of later complication, without mention of misadventure at the time of the procedure: Secondary | ICD-10-CM | POA: Diagnosis not present

## 2022-10-02 DIAGNOSIS — I468 Cardiac arrest due to other underlying condition: Secondary | ICD-10-CM | POA: Diagnosis not present

## 2022-10-02 DIAGNOSIS — Z930 Tracheostomy status: Secondary | ICD-10-CM | POA: Diagnosis not present

## 2022-10-02 DIAGNOSIS — B9789 Other viral agents as the cause of diseases classified elsewhere: Secondary | ICD-10-CM | POA: Diagnosis present

## 2022-10-02 DIAGNOSIS — Z823 Family history of stroke: Secondary | ICD-10-CM

## 2022-10-02 DIAGNOSIS — E875 Hyperkalemia: Secondary | ICD-10-CM | POA: Diagnosis not present

## 2022-10-02 DIAGNOSIS — Z801 Family history of malignant neoplasm of trachea, bronchus and lung: Secondary | ICD-10-CM

## 2022-10-02 DIAGNOSIS — D509 Iron deficiency anemia, unspecified: Secondary | ICD-10-CM | POA: Diagnosis not present

## 2022-10-02 DIAGNOSIS — R6521 Severe sepsis with septic shock: Secondary | ICD-10-CM | POA: Diagnosis not present

## 2022-10-02 DIAGNOSIS — M96A4 Flail chest associated with chest compression and cardiopulmonary resuscitation: Secondary | ICD-10-CM | POA: Diagnosis not present

## 2022-10-02 DIAGNOSIS — R221 Localized swelling, mass and lump, neck: Secondary | ICD-10-CM | POA: Diagnosis not present

## 2022-10-02 DIAGNOSIS — N179 Acute kidney failure, unspecified: Secondary | ICD-10-CM | POA: Diagnosis present

## 2022-10-02 DIAGNOSIS — Z825 Family history of asthma and other chronic lower respiratory diseases: Secondary | ICD-10-CM

## 2022-10-02 DIAGNOSIS — Z79899 Other long term (current) drug therapy: Secondary | ICD-10-CM

## 2022-10-02 DIAGNOSIS — K7689 Other specified diseases of liver: Secondary | ICD-10-CM | POA: Diagnosis not present

## 2022-10-02 DIAGNOSIS — L299 Pruritus, unspecified: Secondary | ICD-10-CM | POA: Diagnosis not present

## 2022-10-02 DIAGNOSIS — G4733 Obstructive sleep apnea (adult) (pediatric): Secondary | ICD-10-CM | POA: Diagnosis present

## 2022-10-02 DIAGNOSIS — I447 Left bundle-branch block, unspecified: Secondary | ICD-10-CM | POA: Diagnosis present

## 2022-10-02 DIAGNOSIS — I509 Heart failure, unspecified: Secondary | ICD-10-CM | POA: Diagnosis not present

## 2022-10-02 DIAGNOSIS — R1084 Generalized abdominal pain: Secondary | ICD-10-CM | POA: Diagnosis not present

## 2022-10-02 DIAGNOSIS — M79602 Pain in left arm: Secondary | ICD-10-CM | POA: Diagnosis not present

## 2022-10-02 DIAGNOSIS — Z7901 Long term (current) use of anticoagulants: Secondary | ICD-10-CM

## 2022-10-02 DIAGNOSIS — I8510 Secondary esophageal varices without bleeding: Secondary | ICD-10-CM | POA: Diagnosis present

## 2022-10-02 DIAGNOSIS — R0682 Tachypnea, not elsewhere classified: Secondary | ICD-10-CM | POA: Diagnosis not present

## 2022-10-02 DIAGNOSIS — I484 Atypical atrial flutter: Secondary | ICD-10-CM | POA: Diagnosis present

## 2022-10-02 DIAGNOSIS — E871 Hypo-osmolality and hyponatremia: Secondary | ICD-10-CM | POA: Diagnosis present

## 2022-10-02 DIAGNOSIS — Z79890 Hormone replacement therapy (postmenopausal): Secondary | ICD-10-CM

## 2022-10-02 DIAGNOSIS — R0603 Acute respiratory distress: Secondary | ICD-10-CM | POA: Diagnosis not present

## 2022-10-02 DIAGNOSIS — I129 Hypertensive chronic kidney disease with stage 1 through stage 4 chronic kidney disease, or unspecified chronic kidney disease: Secondary | ICD-10-CM | POA: Diagnosis not present

## 2022-10-02 DIAGNOSIS — K7290 Hepatic failure, unspecified without coma: Secondary | ICD-10-CM | POA: Diagnosis not present

## 2022-10-02 DIAGNOSIS — J440 Chronic obstructive pulmonary disease with acute lower respiratory infection: Secondary | ICD-10-CM | POA: Diagnosis present

## 2022-10-02 DIAGNOSIS — J9690 Respiratory failure, unspecified, unspecified whether with hypoxia or hypercapnia: Secondary | ICD-10-CM | POA: Diagnosis not present

## 2022-10-02 DIAGNOSIS — I152 Hypertension secondary to endocrine disorders: Secondary | ICD-10-CM | POA: Diagnosis present

## 2022-10-02 DIAGNOSIS — I2489 Other forms of acute ischemic heart disease: Secondary | ICD-10-CM | POA: Diagnosis present

## 2022-10-02 DIAGNOSIS — Z8249 Family history of ischemic heart disease and other diseases of the circulatory system: Secondary | ICD-10-CM

## 2022-10-02 DIAGNOSIS — E861 Hypovolemia: Secondary | ICD-10-CM | POA: Diagnosis not present

## 2022-10-02 DIAGNOSIS — Z4659 Encounter for fitting and adjustment of other gastrointestinal appliance and device: Secondary | ICD-10-CM | POA: Diagnosis not present

## 2022-10-02 DIAGNOSIS — K567 Ileus, unspecified: Secondary | ICD-10-CM | POA: Diagnosis not present

## 2022-10-02 DIAGNOSIS — R579 Shock, unspecified: Secondary | ICD-10-CM | POA: Diagnosis not present

## 2022-10-02 DIAGNOSIS — I469 Cardiac arrest, cause unspecified: Secondary | ICD-10-CM | POA: Diagnosis not present

## 2022-10-02 LAB — CBC WITH DIFFERENTIAL/PLATELET
Abs Immature Granulocytes: 0.09 10*3/uL — ABNORMAL HIGH (ref 0.00–0.07)
Basophils Absolute: 0 10*3/uL (ref 0.0–0.1)
Basophils Relative: 0 %
Eosinophils Absolute: 0 10*3/uL (ref 0.0–0.5)
Eosinophils Relative: 0 %
HCT: 38.6 % (ref 36.0–46.0)
Hemoglobin: 12.5 g/dL (ref 12.0–15.0)
Immature Granulocytes: 1 %
Lymphocytes Relative: 5 %
Lymphs Abs: 0.7 10*3/uL (ref 0.7–4.0)
MCH: 28.4 pg (ref 26.0–34.0)
MCHC: 32.4 g/dL (ref 30.0–36.0)
MCV: 87.7 fL (ref 80.0–100.0)
Monocytes Absolute: 1.1 10*3/uL — ABNORMAL HIGH (ref 0.1–1.0)
Monocytes Relative: 8 %
Neutro Abs: 12.2 10*3/uL — ABNORMAL HIGH (ref 1.7–7.7)
Neutrophils Relative %: 86 %
Platelets: 147 10*3/uL — ABNORMAL LOW (ref 150–400)
RBC: 4.4 MIL/uL (ref 3.87–5.11)
RDW: 22.4 % — ABNORMAL HIGH (ref 11.5–15.5)
Smear Review: NORMAL
WBC: 14.2 10*3/uL — ABNORMAL HIGH (ref 4.0–10.5)
nRBC: 0.2 % (ref 0.0–0.2)

## 2022-10-02 LAB — RESPIRATORY PANEL BY PCR

## 2022-10-02 LAB — I-STAT VENOUS BLOOD GAS, ED
Acid-base deficit: 1 mmol/L (ref 0.0–2.0)
Bicarbonate: 24.2 mmol/L (ref 20.0–28.0)
Calcium, Ion: 0.8 mmol/L — CL (ref 1.15–1.40)
HCT: 44 % (ref 36.0–46.0)
Hemoglobin: 15 g/dL (ref 12.0–15.0)
O2 Saturation: 38 %
Potassium: 4.4 mmol/L (ref 3.5–5.1)
Sodium: 131 mmol/L — ABNORMAL LOW (ref 135–145)
TCO2: 26 mmol/L (ref 22–32)
pCO2, Ven: 42.6 mmHg — ABNORMAL LOW (ref 44–60)
pH, Ven: 7.363 (ref 7.25–7.43)
pO2, Ven: 23 mmHg — CL (ref 32–45)

## 2022-10-02 LAB — COMPREHENSIVE METABOLIC PANEL
ALT: 681 U/L — ABNORMAL HIGH (ref 0–44)
AST: 1308 U/L — ABNORMAL HIGH (ref 15–41)
Albumin: 4.3 g/dL (ref 3.5–5.0)
Alkaline Phosphatase: 102 U/L (ref 38–126)
Anion gap: 19 — ABNORMAL HIGH (ref 5–15)
BUN: 15 mg/dL (ref 8–23)
CO2: 25 mmol/L (ref 22–32)
Calcium: 7.7 mg/dL — ABNORMAL LOW (ref 8.9–10.3)
Chloride: 90 mmol/L — ABNORMAL LOW (ref 98–111)
Creatinine, Ser: 3.69 mg/dL — ABNORMAL HIGH (ref 0.44–1.00)
GFR, Estimated: 12 mL/min — ABNORMAL LOW (ref >60)
Glucose, Bld: 87 mg/dL (ref 70–99)
Potassium: 4.1 mmol/L (ref 3.5–5.1)
Sodium: 134 mmol/L — ABNORMAL LOW (ref 135–145)
Total Bilirubin: 3.1 mg/dL — ABNORMAL HIGH (ref 0.3–1.2)
Total Protein: 9.3 g/dL — ABNORMAL HIGH (ref 6.5–8.1)

## 2022-10-02 LAB — RESP PANEL BY RT-PCR (RSV, FLU A&B, COVID)  RVPGX2
Influenza A by PCR: NEGATIVE
Influenza B by PCR: NEGATIVE
Resp Syncytial Virus by PCR: NEGATIVE
SARS Coronavirus 2 by RT PCR: NEGATIVE

## 2022-10-02 LAB — GLUCOSE, CAPILLARY: Glucose-Capillary: 80 mg/dL (ref 70–99)

## 2022-10-02 LAB — LACTIC ACID, PLASMA
Lactic Acid, Venous: 7.7 mmol/L (ref 0.5–1.9)
Lactic Acid, Venous: 7.6 mmol/L (ref 0.5–1.9)

## 2022-10-02 LAB — BILIRUBIN, DIRECT: Bilirubin, Direct: 1.4 mg/dL — ABNORMAL HIGH (ref 0.0–0.2)

## 2022-10-02 LAB — TROPONIN I (HIGH SENSITIVITY)
Troponin I (High Sensitivity): 142 ng/L (ref <18)
Troponin I (High Sensitivity): 153 ng/L (ref <18)

## 2022-10-02 LAB — MAGNESIUM: Magnesium: 2.2 mg/dL (ref 1.7–2.4)

## 2022-10-02 MED ORDER — PIPERACILLIN-TAZOBACTAM 3.375 G IVPB 30 MIN
3.3750 g | Freq: Once | INTRAVENOUS | Status: AC
  Administered 2022-10-02: 3.375 g via INTRAVENOUS
  Filled 2022-10-02: qty 50

## 2022-10-02 MED ORDER — NITROGLYCERIN 0.4 MG SL SUBL: 0.4000 mg | SUBLINGUAL_TABLET | SUBLINGUAL | Status: DC | PRN

## 2022-10-02 MED ORDER — SODIUM CHLORIDE 0.9 % IV SOLN: 1.0000 g | Freq: Once | INTRAVENOUS | Status: DC

## 2022-10-02 MED ORDER — METHYLPREDNISOLONE SODIUM SUCC 125 MG IJ SOLR
125.0000 mg | Freq: Once | INTRAMUSCULAR | Status: AC
  Administered 2022-10-02: 125 mg via INTRAVENOUS
  Filled 2022-10-02: qty 2

## 2022-10-02 MED ORDER — INSULIN ASPART 100 UNIT/ML IJ SOLN: 0.0000 [IU] | INTRAMUSCULAR | Status: DC

## 2022-10-02 MED ORDER — DOCUSATE SODIUM 100 MG PO CAPS: 100.0000 mg | ORAL_CAPSULE | Freq: Two times a day (BID) | ORAL | Status: DC | PRN

## 2022-10-02 MED ORDER — IOHEXOL 350 MG/ML SOLN
80.0000 mL | Freq: Once | INTRAVENOUS | Status: AC | PRN
  Administered 2022-10-02: 80 mL via INTRAVENOUS

## 2022-10-02 MED ORDER — FENTANYL CITRATE PF 50 MCG/ML IJ SOSY
50.0000 ug | PREFILLED_SYRINGE | Freq: Once | INTRAMUSCULAR | Status: AC
  Administered 2022-10-02: 50 ug via INTRAVENOUS
  Filled 2022-10-02: qty 1

## 2022-10-02 MED ORDER — LACTATED RINGERS IV BOLUS
500.0000 mL | Freq: Once | INTRAVENOUS | Status: AC
  Administered 2022-10-02: 500 mL via INTRAVENOUS

## 2022-10-02 MED ORDER — MAGNESIUM SULFATE 2 GM/50ML IV SOLN
2.0000 g | Freq: Once | INTRAVENOUS | Status: AC
  Administered 2022-10-02: 2 g via INTRAVENOUS
  Filled 2022-10-02: qty 50

## 2022-10-02 MED ORDER — METHYLPREDNISOLONE SODIUM SUCC 125 MG IJ SOLR
80.0000 mg | INTRAMUSCULAR | Status: DC
  Administered 2022-10-03 – 2022-10-04 (×2): 80 mg via INTRAVENOUS
  Filled 2022-10-02 (×2): qty 2

## 2022-10-02 MED ORDER — ORAL CARE MOUTH RINSE
15.0000 mL | OROMUCOSAL | Status: DC | PRN
  Filled 2022-10-02: qty 15

## 2022-10-02 MED ORDER — VANCOMYCIN HCL IN DEXTROSE 1-5 GM/200ML-% IV SOLN
1000.0000 mg | Freq: Once | INTRAVENOUS | Status: AC
  Administered 2022-10-02: 1000 mg via INTRAVENOUS
  Filled 2022-10-02: qty 200

## 2022-10-02 MED ORDER — POLYETHYLENE GLYCOL 3350 17 G PO PACK: 17.0000 g | PACK | Freq: Every day | ORAL | Status: DC | PRN

## 2022-10-02 MED ORDER — LACTATED RINGERS IV BOLUS (SEPSIS)
500.0000 mL | Freq: Once | INTRAVENOUS | Status: AC
  Administered 2022-10-02: 500 mL via INTRAVENOUS

## 2022-10-02 MED ORDER — LACTATED RINGERS IV BOLUS: 500.0000 mL | Freq: Once | INTRAVENOUS | Status: DC

## 2022-10-02 MED ORDER — HYDROCODONE BIT-HOMATROP MBR 5-1.5 MG/5ML PO SOLN
5.0000 mL | Freq: Four times a day (QID) | ORAL | Status: DC | PRN
  Administered 2022-10-02 – 2022-10-03 (×3): 5 mL via ORAL
  Filled 2022-10-02 (×5): qty 5

## 2022-10-02 MED ORDER — METRONIDAZOLE 500 MG/100ML IV SOLN
500.0000 mg | Freq: Once | INTRAVENOUS | Status: DC
  Filled 2022-10-02: qty 100

## 2022-10-02 MED ORDER — IOHEXOL 350 MG/ML SOLN
75.0000 mL | Freq: Once | INTRAVENOUS | Status: AC | PRN
  Administered 2022-10-02: 75 mL via INTRAVENOUS

## 2022-10-02 MED ORDER — IPRATROPIUM-ALBUTEROL 0.5-2.5 (3) MG/3ML IN SOLN
3.0000 mL | Freq: Once | RESPIRATORY_TRACT | Status: AC
  Administered 2022-10-02: 3 mL via RESPIRATORY_TRACT
  Filled 2022-10-02: qty 3

## 2022-10-02 MED ORDER — CALCIUM GLUCONATE-NACL 1-0.675 GM/50ML-% IV SOLN
1.0000 g | Freq: Once | INTRAVENOUS | Status: AC
  Administered 2022-10-02: 1000 mg via INTRAVENOUS
  Filled 2022-10-02: qty 50

## 2022-10-02 MED ORDER — CHLORHEXIDINE GLUCONATE CLOTH 2 % EX PADS
6.0000 | MEDICATED_PAD | Freq: Every day | CUTANEOUS | Status: DC
  Administered 2022-10-02 – 2022-10-04 (×3): 6 via TOPICAL
  Filled 2022-10-02: qty 6

## 2022-10-02 MED ORDER — IPRATROPIUM-ALBUTEROL 0.5-2.5 (3) MG/3ML IN SOLN
3.0000 mL | Freq: Four times a day (QID) | RESPIRATORY_TRACT | Status: DC
  Administered 2022-10-02 – 2022-10-12 (×29): 3 mL via RESPIRATORY_TRACT
  Filled 2022-10-02 (×30): qty 3

## 2022-10-02 NOTE — Progress Notes (Signed)
Placed an ear clip pulse probe on patient's right ear lobe.  Patient's SPO2 is 100% with a good wave form.

## 2022-10-02 NOTE — ED Notes (Signed)
Pt placed on O2 2L; when pt removed mask, dried blood noted in mask

## 2022-10-02 NOTE — ED Notes (Signed)
Patient transported to CT on monitor with this RN and RT. Placed on Non rebreather for comfort during scan as patient has trouble lying flat.

## 2022-10-02 NOTE — Progress Notes (Signed)
Pharmacy Antibiotic Note  Morgan Clay is a 77 y.o. female for which pharmacy has been consulted for vancomycin dosing for sepsis.  Patient with a history of LBBB, HF, ICD, ESRD on HD MWF, COPD, T2DM, AF on eliquis, esophageal varices, pulmonary hypertension. Patient presenting with SOB/hypoxia.  WBC 14.2; LA 7.6; T 97.8; HR 93; RR 26 COVID neg / flu neg  Plan: Zosyn x 1 given by ED MD Vancomycin 1g x 1 given in ED - repeat dosing per HD schedule Trend WBC, Fever, Renal function F/u cultures, clinical course, WBC De-escalate when able F/u Nephrology plan     Temp (24hrs), Avg:97.8 F (36.6 C), Min:97.8 F (36.6 C), Max:97.8 F (36.6 C)  Recent Labs  Lab 09/28/22 1834 09/28/22 2105 09/30/22 1515 10/02/22 1616 10/02/22 1626  WBC 5.9  --  5.0 14.2*  --   CREATININE  --  6.73* 5.95* 3.69*  --   LATICACIDVEN  --   --   --   --  7.7*    Estimated Creatinine Clearance: 10.5 mL/min (A) (by C-G formula based on SCr of 3.69 mg/dL (H)).    No Known Allergies  Microbiology results: Pending  Thank you for allowing pharmacy to be a part of this patient's care.  Delmar Landau, PharmD, BCPS 10/02/2022 6:20 PM ED Clinical Pharmacist -  [phone]

## 2022-10-02 NOTE — Progress Notes (Incomplete)
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   BP 113/49, HR 93, RR 15, o2 sats 95%.  eICU Interventions  Follow up respiratory viral panel.  Continue IV steroids and neb treatments around the clock.  Insulin for glucose control.  Renal consult for HD.  Continue to trend lactate and troponin.      Intervention Category Evaluation Type: New Patient Evaluation  Larinda Buttery 10/02/2022, 10:52 PM

## 2022-10-02 NOTE — Progress Notes (Signed)
RT placed patient on NRB to transport to CT. I accompanied RN with transport. No issues. Patient asked me to leave her on NRB upon arrival back to room.

## 2022-10-02 NOTE — Progress Notes (Signed)
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    eICU Interventions       Intervention Category Evaluation Type: New Patient Evaluation  Larinda Buttery 10/02/2022, 10:52 PM

## 2022-10-02 NOTE — Sepsis Progress Note (Signed)
Sepsis protocol is being followed by eLink. 

## 2022-10-02 NOTE — ED Notes (Signed)
Patient with abdominal pain, states that it is 12/10 in pain.

## 2022-10-02 NOTE — ED Provider Notes (Signed)
[place] [facility] AT [facility] Provider Note   CSN: [id number] Arrival date & time: 10/02/22  1556     History  Chief Complaint  Patient presents with   Shortness of Breath    Morgan Clay is a 77 y.o. female.   Shortness of Breath Associated symptoms: chest pain and cough      77 year old female with medical history significant for left bundle branch block, chronic CHF with an ICD in place, ESRD on hemodialysis MWF, COPD, DM 2, atrial fibrillation on Eliquis, esophageal varices, pulmonary hypertension who presents to the emergency department with shortness of breath and hypoxia.  The patient has been seen in the emergency department twice over the last several days for cough and shortness of breath.  Been previously diagnosed with a viral syndrome.  He has been to dialysis and has not missed any dialysis appointments and has had significant amount of volume taken off during her dialysis sessions without improvement in her shortness of breath.  He missed a dose of Eliquis this morning but otherwise has been compliant with her medication.  She endorses pleuritic chest discomfort on the right.She denies any lower extremity swelling. No fevers or chills.  Home Medications Prior to Admission medications   Medication Sig Start Date End Date Taking? Authorizing Provider  albuterol (VENTOLIN HFA) 108 (90 Base) MCG/ACT inhaler Inhale 2 puffs into the lungs every 6 (six) hours as needed for wheezing or shortness of breath. 04/07/22   Hunsucker, Lesia Sago, MD  amiodarone (PACERONE) 200 MG tablet Take 1 tablet by mouth once daily 08/30/22   Saguier, Ramon Dredge, PA-C  ammonium lactate (LAC-HYDRIN) 12 % lotion Apply daily on forearms. 09/05/22   Sharlene Dory, DO  amoxicillin-clavulanate (AUGMENTIN) 875-125 MG tablet Take 1 tablet by mouth 2 (two) times daily. 09/28/22   Clayborne [name], NP  apixaban (ELIQUIS) 2.5 MG TABS tablet Take 1 tablet (2.5 mg total) by mouth  2 (two) times daily. 03/11/22   Pokhrel, Rebekah Chesterfield, MD  cyclobenzaprine (FLEXERIL) 5 MG tablet Take 5 mg by mouth at bedtime as needed for muscle spasms. 12/07/21   [provider]  gabapentin (NEURONTIN) 300 MG capsule Take 1 capsule (300 mg total) by mouth daily. Fill one rx. Computer may have sent over 2 Patient taking differently: Take 300 mg by mouth daily. 01/10/22   Saguier, Ramon Dredge, PA-C  HYDROcodone bit-homatropine (HYCODAN) 5-1.5 MG/5ML syrup Take 5 mLs by mouth every 8 (eight) hours as needed for up to 5 days for cough. 09/28/22 10/03/22  Clayborne [name], NP  ipratropium (ATROVENT HFA) 17 MCG/ACT inhaler Inhale 2 puffs into the lungs every 6 (six) hours as needed for wheezing. 03/24/22   Saguier, Ramon Dredge, PA-C  levothyroxine (SYNTHROID) 125 MCG tablet Take 1 tablet (125 mcg total) by mouth daily before breakfast. 01/16/22   Saguier, Ramon Dredge, PA-C  lidocaine-prilocaine (EMLA) cream Apply 1 Application topically as needed.    [provider]  methylPREDNISolone (MEDROL) 4 MG tablet Standard 6 day taper 09/28/22   Clayborne [name], NP  nitroGLYCERIN (NITROSTAT) 0.4 MG SL tablet Place 1 tablet (0.4 mg total) under the tongue every 5 (five) minutes as needed for chest pain. Patient not taking: Reported on 09/28/2022 10/27/21   Leroy Sea, MD  triamcinolone cream (KENALOG) 0.1 % Apply to shins twice dialy for 7 days. 09/05/22   Sharlene Dory, DO  Vitamin D, Ergocalciferol, (DRISDOL) 1.25 MG (50000 UNIT) CAPS capsule Take 1 capsule (50,000 Units total) by mouth  every 7 (seven) days. 09/06/22   Sharlene Dory, DO      Allergies    Patient has no known allergies.    Review of Systems   Review of Systems  Respiratory:  Positive for cough and shortness of breath.   Cardiovascular:  Positive for chest pain. Negative for leg swelling.  All other systems reviewed and are negative.   Physical Exam Updated Vital Signs BP (!) 160/71   Pulse 94   Temp 97.8 F (36.6 C)  (Oral)   Resp (!) 22   SpO2 94%  Physical Exam Vitals and nursing note reviewed.  Constitutional:      General: She is not in acute distress.    Appearance: She is well-developed.  HENT:     Head: Normocephalic and atraumatic.  Eyes:     Conjunctiva/sclera: Conjunctivae normal.  Cardiovascular:     Rate and Rhythm: Normal rate and regular rhythm.  Pulmonary:     Effort: Tachypnea and respiratory distress present.     Breath sounds: Examination of the right-upper field reveals wheezing. Examination of the left-upper field reveals wheezing. Examination of the right-middle field reveals wheezing. Examination of the left-middle field reveals wheezing. Examination of the right-lower field reveals wheezing. Examination of the left-lower field reveals wheezing. Wheezing present.  Abdominal:     Palpations: Abdomen is soft.     Tenderness: There is no abdominal tenderness.  Musculoskeletal:        General: No swelling.     Cervical back: Neck supple.     Right lower leg: No edema.     Left lower leg: No edema.  Skin:    General: Skin is warm and dry.     Capillary Refill: Capillary refill takes less than 2 seconds.  Neurological:     Mental Status: She is alert.  Psychiatric:        Mood and Affect: Mood normal.     ED Results / Procedures / Treatments   Labs (all labs ordered are listed, but only abnormal results are displayed) Labs Reviewed  CBC WITH DIFFERENTIAL/PLATELET - Abnormal; Notable for the following components:      Result Value   WBC 14.2 (*)    RDW 22.4 (*)    Platelets 147 (*)    Neutro Abs 12.2 (*)    Monocytes Absolute 1.1 (*)    Abs Immature Granulocytes 0.09 (*)    All other components within normal limits  COMPREHENSIVE METABOLIC PANEL - Abnormal; Notable for the following components:   Sodium 134 (*)    Chloride 90 (*)    Creatinine, Ser 3.69 (*)    Calcium 7.7 (*)    Total Protein 9.3 (*)    AST 1,308 (*)    ALT 681 (*)    Total Bilirubin 3.1 (*)     GFR, Estimated 12 (*)    Anion gap 19 (*)    All other components within normal limits  LACTIC ACID, PLASMA - Abnormal; Notable for the following components:   Lactic Acid, Venous 7.7 (*)    All other components within normal limits  LACTIC ACID, PLASMA - Abnormal; Notable for the following components:   Lactic Acid, Venous 7.6 (*)    All other components within normal limits  I-STAT VENOUS BLOOD GAS, ED - Abnormal; Notable for the following components:   pCO2, Ven 42.6 (*)    pO2, Ven 23 (*)    Sodium 131 (*)    Calcium, Ion 0.80 (*)  All other components within normal limits  TROPONIN I (HIGH SENSITIVITY) - Abnormal; Notable for the following components:   Troponin I (High Sensitivity) 142 (*)    All other components within normal limits  TROPONIN I (HIGH SENSITIVITY) - Abnormal; Notable for the following components:   Troponin I (High Sensitivity) 153 (*)    All other components within normal limits  RESP PANEL BY RT-PCR (RSV, FLU A&B, COVID)  RVPGX2  CULTURE, BLOOD (ROUTINE X 2)  CULTURE, BLOOD (ROUTINE X 2)  RESPIRATORY PANEL BY PCR  MAGNESIUM  LACTIC ACID, PLASMA  BILIRUBIN, DIRECT  URINALYSIS, W/ REFLEX TO CULTURE (INFECTION SUSPECTED)  TROPONIN I (HIGH SENSITIVITY)    EKG EKG Interpretation  Date/Time:  Monday Oct 02 2022 17:45:33 EDT Ventricular Rate:  93 PR Interval:  138 QRS Duration: 162 QT Interval:  480 QTC Calculation: 598 R Axis:   -87 Text Interpretation: Sinus rhythm IVCD, consider atypical RBBB Left ventricular hypertrophy ST-T changes inferiorly, no STEMI discussed with Dr. Jacinto Halim Confirmed by Ernie Avena ([id number]) on 10/02/2022 6:26:27 PM  Radiology CT SOFT TISSUE NECK WO CONTRAST  Result Date: 10/02/2022 CLINICAL DATA:  Neck swelling EXAM: CT NECK WITHOUT CONTRAST TECHNIQUE: Multidetector CT imaging of the neck was performed following the standard protocol without intravenous contrast. RADIATION DOSE REDUCTION: This exam was performed according  to the departmental dose-optimization program which includes automated exposure control, adjustment of the mA and/or kV according to patient size and/or use of iterative reconstruction technique. COMPARISON:  None Available. FINDINGS: Pharynx and larynx: Evaluation is somewhat limited by motion artifact. No definite mass possible mild pharyngeal mucosal swelling. No significant airway narrowing. Salivary glands: No inflammation, mass, or stone. Thyroid: Normal. Lymph nodes: None enlarged or abnormal density. Vascular: Evaluation is limited by the absence of intravenous contrast. Within this limitation, no vascular abnormality. Right IJ catheter. Limited intracranial: Negative. Visualized orbits: No acute finding. Status post bilateral lens replacements. Mastoids and visualized paranasal sinuses: Clear. Skeleton: No acute osseous abnormality. Degenerative changes in the cervical spine. Upper chest: Evaluation is limited by motion and beam hardening artifact. Within this limitation, no focal pulmonary opacity or pleural effusion. Left chest cardiac device Other: None. IMPRESSION: Evaluation is somewhat limited by motion artifact and the absence of intravenous contrast. Possible mild pharyngeal mucosal swelling, which could represent pharyngitis in the appropriate clinical setting. No significant airway narrowing. Electronically Signed   By: Wiliam Ke M.D.   On: 10/02/2022 19:07   CT Angio Abd/Pel w/ and/or w/o  Result Date: 10/02/2022 CLINICAL DATA:  Abdominal pain EXAM: CTA ABDOMEN AND PELVIS WITHOUT AND WITH CONTRAST TECHNIQUE: Multidetector CT imaging of the abdomen and pelvis was performed using the standard protocol during bolus administration of intravenous contrast. Multiplanar reconstructed images and MIPs were obtained and reviewed to evaluate the vascular anatomy. RADIATION DOSE REDUCTION: This exam was performed according to the departmental dose-optimization program which includes automated  exposure control, adjustment of the mA and/or kV according to patient size and/or use of iterative reconstruction technique. CONTRAST:  80mL OMNIPAQUE IOHEXOL 350 MG/ML SOLN COMPARISON:  07/05/2022 FINDINGS: VASCULAR Normal contour and caliber of the abdominal aorta. No evidence of aneurysm, dissection, or other acute aortic pathology. Standard branching pattern of the abdominal aorta, with solitary bilateral renal arteries. Moderate mixed calcific atherosclerosis. Review of the MIP images confirms the above findings. NON-VASCULAR Lower chest: Please see separately reported same day examination of the chest. Cardiomegaly. Hepatobiliary: No solid liver abnormality is seen. Coarse contour of the liver. Status post cholecystectomy.  Pancreas: Unremarkable. No pancreatic ductal dilatation or surrounding inflammatory changes. Spleen: Normal in size without significant abnormality. Adrenals/Urinary Tract: Adrenal glands are unremarkable. Kidneys are normal, without renal calculi, solid lesion, or hydronephrosis. Bladder is unremarkable. Stomach/Bowel: Stomach is within normal limits. Appendix not clearly visualized and may be surgically absent. No evidence of bowel wall thickening, distention, or inflammatory changes. Vascular/Lymphatic: No significant vascular findings are present. No enlarged abdominal or pelvic lymph nodes. Reproductive: Calcified uterine fibroids. Other: Small left inguinal hernia containing fat and fluid (series 4, image 11). No ascites. Musculoskeletal: No acute or significant osseous findings. IMPRESSION: 1. Normal contour and caliber of the abdominal aorta. No evidence of aneurysm, dissection, or other acute aortic pathology. Moderate mixed calcific atherosclerosis. 2. No acute CT findings of the abdomen or pelvis to explain abdominal pain. 3. Coarse contour of the liver, suggestive of cirrhosis. Correlate with biochemical findings. 4. Small left inguinal hernia containing fat and fluid. 5. Uterine  fibroids. Aortic Atherosclerosis (ICD10-I70.0). Electronically Signed   By: Jearld Lesch M.D.   On: 10/02/2022 19:03   CT Angio Chest PE W and/or Wo Contrast  Result Date: 10/02/2022 CLINICAL DATA:  Pulmonary embolism (PE) suspected, high prob EXAM: CT ANGIOGRAPHY CHEST WITH CONTRAST TECHNIQUE: Multidetector CT imaging of the chest was performed using the standard protocol during bolus administration of intravenous contrast. Multiplanar CT image reconstructions and MIPs were obtained to evaluate the vascular anatomy. RADIATION DOSE REDUCTION: This exam was performed according to the departmental dose-optimization program which includes automated exposure control, adjustment of the mA and/or kV according to patient size and/or use of iterative reconstruction technique. CONTRAST:  75mL OMNIPAQUE IOHEXOL 350 MG/ML SOLN COMPARISON:  Chest radiograph 09/30/2022 FINDINGS: Cardiovascular: Breathing motion artifact limitations. Allowing for this, no evidence of pulmonary embolus. Right-sided dialysis catheter tip in the distal SVC. Left-sided pacemaker in place. Multi chamber cardiomegaly. Contrast refluxes a prominently into the hepatic veins and IVC. No pericardial effusion. Atherosclerosis of the thoracic aorta, no aneurysm. Mediastinum/Nodes: Subcarinal lymph node measures 15 mm. Additional shotty mediastinal lymph nodes. There is no hilar adenopathy. Patulous esophagus. Lungs/Pleura: Motion artifact limitations. Mosaic attenuation of the pulmonary parenchyma. Minor subsegmental atelectasis in the lingula. No features of pulmonary edema. No pleural fluid. Trachea and central airways are clear. Upper Abdomen: Stranding in the right upper quadrant medial to the liver, etiology uncertain. Musculoskeletal: There are no acute or suspicious osseous abnormalities. Review of the MIP images confirms the above findings. IMPRESSION: 1. No pulmonary embolus allowing for motion artifact limitations. 2. Multi chamber  cardiomegaly with reflux of contrast into the hepatic veins and IVC consistent with elevated right heart pressures. 3. Mosaic attenuation of the pulmonary parenchyma, can be seen with small airways disease or small vessel disease. 4. Stranding in the right upper quadrant medial to the liver, only partially included in the field of view, etiology indeterminate. Aortic Atherosclerosis (ICD10-I70.0). Electronically Signed   By: Narda Rutherford M.D.   On: 10/02/2022 18:04    Procedures .Critical Care  Performed by: Ernie Avena, MD Authorized by: Ernie Avena, MD   Critical care provider statement:    Critical care time (minutes):  85   Critical care was necessary to treat or prevent imminent or life-threatening deterioration of the following conditions:  Respiratory failure, dehydration and hepatic failure   Critical care was time spent personally by me on the following activities:  Development of treatment plan with patient or surrogate, discussions with consultants, evaluation of patient's response to treatment, examination of patient, ordering  and review of laboratory studies, ordering and review of radiographic studies, ordering and performing treatments and interventions, pulse oximetry, re-evaluation of patient's condition and review of old charts   Care discussed with: admitting provider       Medications Ordered in ED Medications  lactated ringers bolus 500 mL (has no administration in time range)  ipratropium-albuterol (DUONEB) 0.5-2.5 (3) MG/3ML nebulizer solution 3 mL (3 mLs Nebulization Given 10/02/22 1627)  magnesium sulfate IVPB 2 g 50 mL (0 g Intravenous Stopped 10/02/22 1806)  methylPREDNISolone sodium succinate (SOLU-MEDROL) 125 mg/2 mL injection 125 mg (125 mg Intravenous Given 10/02/22 1636)  lactated ringers bolus 500 mL (0 mLs Intravenous Stopped 10/02/22 1742)  iohexol (OMNIPAQUE) 350 MG/ML injection 75 mL (75 mLs Intravenous Contrast Given 10/02/22 1721)  lactated ringers  bolus 500 mL ( Intravenous Rate/Dose Change 10/02/22 2042)  vancomycin (VANCOCIN) IVPB 1000 mg/200 mL premix (0 mg Intravenous Stopped 10/02/22 2042)  piperacillin-tazobactam (ZOSYN) IVPB 3.375 g (0 g Intravenous Stopped 10/02/22 1917)  iohexol (OMNIPAQUE) 350 MG/ML injection 80 mL (80 mLs Intravenous Contrast Given 10/02/22 1834)  ipratropium-albuterol (DUONEB) 0.5-2.5 (3) MG/3ML nebulizer solution 3 mL (3 mLs Nebulization Given 10/02/22 1955)  fentaNYL (SUBLIMAZE) injection 50 mcg (50 mcg Intravenous Given 10/02/22 2011)  fentaNYL (SUBLIMAZE) injection 50 mcg (50 mcg Intravenous Given 10/02/22 2100)    ED Course/ Medical Decision Making/ A&P                             Medical Decision Making Amount and/or Complexity of Data Reviewed Labs: ordered. Radiology: ordered.  Risk Prescription drug management. Decision regarding hospitalization.    76 year old female with medical history significant for left bundle branch block, chronic CHF with an ICD in place, ESRD on hemodialysis MWF, COPD, DM 2, atrial fibrillation on Eliquis, esophageal varices, pulmonary hypertension who presents to the emergency department with shortness of breath and hypoxia.  The patient has been seen in the emergency department twice over the last several days for cough and shortness of breath.  Been previously diagnosed with a viral syndrome.  He has been to dialysis and has not missed any dialysis appointments and has had significant amount of volume taken off during her dialysis sessions without improvement in her shortness of breath.  He missed a dose of Eliquis this morning but otherwise has been compliant with her medication.  She endorses pleuritic chest discomfort on the right.She denies any lower extremity swelling. No fevers or chills.   On arrival, the patient was afebrile, not tachycardic heart rate 94, tachypneic RR 24, and hypoxic respiratory failure with an O2 saturation of 89%, the patient had desaturations  while coughing down to 70% with good waveform, subsequently placed on 2 L O2 via nasal cannula with improvement.    Differential diagnosis includes viral URI, COPD exacerbation, PE, pneumothorax, pneumonia, pleural effusion.  Less likely ACS.  Given the patient's diffuse wheezing, suspect likely viral URI and COPD exacerbation.  Initial EKG revealed sinus rhythm, ventricular rate 92, new T wave inversions in the inferior leads, no STEMI, repeat EKG also revealed sinus rhythm,, T wave inversions present in V2 and V1 compared to prior EKGs, no STEMI.  IV access was obtained and the patient was administered IV magnesium, IV Solu-Medrol, was administered DuoNebs.  CTA PE: IMPRESSION:  1. No pulmonary embolus allowing for motion artifact limitations.  2. Multi chamber cardiomegaly with reflux of contrast into the  hepatic veins and IVC consistent with  elevated right heart  pressures.  3. Mosaic attenuation of the pulmonary parenchyma, can be seen with  small airways disease or small vessel disease.  4. Stranding in the right upper quadrant medial to the liver, only  partially included in the field of view, etiology indeterminate.    Aortic Atherosclerosis (ICD10-I70.0).    On repeat assessment, the patient endorsed chest tightness and discomfort.  She had inspiratory and x-ray wheezing and was administered an additional DuoNeb.  She has a history of prolonged QTc on EKG.  Her EKG on repeat revealed nonspecific ST changes, discussed with Dr. Jacinto Halim of on-call cardiology who reviewed the patient's EKG and presentation, no STEMI.  Patient endorsed abdominal pain and felt like her neck was swollen.  Additional CT imaging of the neck and abdomen pelvis was ordered.  Laboratory evaluation significant for lactic acidosis to 7.7, repeat after 500 cc of fluids downtrending to 7.6, CBC with leukocytosis to 14.2, no anemia hemoglobin 12.5.  The patient had been coughing/spitting up small amounts of bright  red blood.  She has a history of gastric AVMs.  I discussed the care of the patient with Dr. Levora Angel of on-call gastroenterology who given the patient's stable hemoglobin and symptoms did not feel the patient needed to be emergently taken for EGD.  I-STAT VBG significant for a pH of 7.36, pCO2 43, low ionized calcium of 0.8, hemoglobin 15, bicarbonate of 24.  The patient's troponin was elevated at 142 and 153.  Given the patient's COPD, history of pulmonary hypertension, evidence of cardiomegaly and reflux of contrast into hepatic veins and IVC consistent with elevated right heart pressures, I have concern for mild right heart dysfunction in the setting of the patient's COPD exacerbation, viral infection and known pulmonary hypertension.  The patient mains maintained on oxygen but had a higher oxygen requirement with persistent tachypnea and maintained on 12 L O2 per minute.  She was covered with broad-spectrum antibiotics.  She additionally was found to have evidence of elevated LFTs with an AST of 1308, ALT of 681 and an elevated T. bili to 3.1.  She appeared severely dehydrated on arrival.  Given her ESRD status and evidence of right heart failure, she was fluid resuscitated with 1 L of IV fluids and an additional 500 cc was ordered.  Septic workup was initiated with blood cultures, UA/UC.   CT Neck WO: IMPRESSION:  Evaluation is somewhat limited by motion artifact and the absence of  intravenous contrast. Possible mild pharyngeal mucosal swelling,  which could represent pharyngitis in the appropriate clinical  setting. No significant airway narrowing.    CTA Abdomen Pelvis: IMPRESSION:  1. Normal contour and caliber of the abdominal aorta. No evidence of  aneurysm, dissection, or other acute aortic pathology. Moderate  mixed calcific atherosclerosis.  2. No acute CT findings of the abdomen or pelvis to explain  abdominal pain.  3. Coarse contour of the liver, suggestive of cirrhosis.  Correlate  with biochemical findings.  4. Small left inguinal hernia containing fat and fluid.  5. Uterine fibroids.    Aortic Atherosclerosis (ICD10-I70.0).    Suspect patient's lactic acidosis is due to decreased clearance of the patient's lactic acid given her underlying cirrhosis and evidence of acutely elevated liver enzymes with acute liver injury.  Patient has no ripping or tearing chest discomfort, low concern for aortic dissection.  No evidence of PE identified on CT imaging.  No infectious etiology of the patient's presentation identified however the patient primarily presented with respiratory  complaints with several days of cough and shortness of breath consistent with likely viral URI with resultant COPD exacerbation.  The patient's ongoing chest discomfort and elevated cardiac enzymes feel that demand ischemia in the setting of the patient's acute illness is more likely, discussed with cardiology, Dr. Jacinto Halim who was in agreement.  I discussed the plan for admission with Dr. Johny Drilling of hospitalist medicine who felt that the patient may need closer monitoring in the ICU.  Pulmonary critical care was consulted for admission, Dr. Francine Graven accepting.   Final Clinical Impression(s) / ED Diagnoses Final diagnoses:  COPD exacerbation (HCC)  Elevated troponin  Pulmonary hypertension (HCC)  Acute respiratory failure with hypoxia (HCC)  Lactic acidosis  Acute right-sided heart failure (HCC)    Rx / DC Orders ED Discharge Orders     None

## 2022-10-02 NOTE — Sepsis Progress Note (Addendum)
Notified provider of need to consider ordering additional fluid bolus of 800 ml to meet the sepsis protocol for LA 7.7. Provider responded that patient is ESRD and therefor not ordered at this time.

## 2022-10-02 NOTE — ED Notes (Signed)
Called [facility] for transport spoke with kiana 

## 2022-10-02 NOTE — ED Triage Notes (Signed)
Pt returns w/ c/o [facility]; she came here from dialysis; O2 sats 89% RA, tachypnea noted

## 2022-10-02 NOTE — H&P (Signed)
NAME:  Morgan Clay, MRN:  [id number], DOB:  29-Jan-1946, LOS: 0 ADMISSION DATE:  10/02/2022, CONSULTATION DATE:  10/02/22 REFERRING MD:  Ernie Avena, MD CHIEF COMPLAINT:  Respiratory Failure  History of Present Illness:  Morgan Clay is a 77 year old woman, never smoker with history of heart failure, pulmonary hypertension, asthma, ESRD on HD, cirrhosis secondary to hepatitis C s/p Harvoni treatment, atrial fibrillation and esophageal varices/small bowel AVMs who presented to [facility] with shortness of breath after dialysis, she was tachypneic with an SpO2 on arrival in the ER.   She was noted to have Lactic acid of 7.7 and was given a bolus with repeat lactic acid 7.6. Due to increase work of breathing she was placed on non-rebreather mask. She was given duoneb and solumedrol 125mg  IV. CTA Chest was negative for PE, there was motion artifact but mosaic attenuation of the pulmonary parenchyma. CT Neck showed possible mild pharyngeal mucosal swelling, but again difficult due to motion artifact. Neck CT was done for neck swelling. CT abdomen done for abdominal pain with no acute findings.   PCCM consulted as patient remained tachypneic with increased work of breathing after transfer from non-rebreather to HFNC.   Patient accepted in transfer from [facility] to [facility] ICU.  Pertinent  Medical History   Asthma Pulmonary Hypertension OSA Chronic Systolic Heart Failure Atrial Fibrillation DMII ESRD on HD Cirrhosis Hepatitis C s/p Harvoni Esophageal Varices Duodenal AVM  Significant Hospital Events: Including procedures, antibiotic start and stop dates in addition to other pertinent events   5/27 presented to [facility], transferred to [facility]  Interim History / Subjective:   Patient arrived to 27M ICU on HFNC 12L She is coughing intermittently, complains of upper abdominal pain - bandlike She is having nausea, no vomiting today but had  emesis yesterday She woke up feeling bad today No sick contacts  Objective   Blood pressure (!) 160/71, pulse 94, temperature 97.8 F (36.6 C), temperature source Oral, resp. rate (!) 22, SpO2 94 %.        Intake/Output Summary (Last 24 hours) at 10/02/2022 2140 Last data filed at 10/02/2022 1812 Gross per 24 hour  Intake 551.3 ml  Output --  Net 551.3 ml   There were no vitals filed for this visit.  Examination: General: elderly woman, moderate distress, ill appearing HENT: Rew/AT, moist mucous membranes, sclera anicteric Lungs: diminished breath sounds, rhonchi Cardiovascular: tachycardic, no murmurs Abdomen: soft, mildly tender upper quadrants Extremities: cool to touch, no edema Neuro: alert, following commands, moving all extremities GU: n/a  RHC 06/23/20 RHC Procedural Findings: Hemodynamics (mmHg) RA mean 21 RV 49/20 PA 59/24, mean 39 PCWP mean 26  Oxygen saturations: PA 64% AO 98%  Cardiac Output (Fick) 5.07  Cardiac Index (Fick) 3.23 PVR 2.6 WU  Cardiac Output (Thermo) 4.72  Cardiac Index (Thermo) 3.01  PVR 2.75 WU  PAPI 1.7 Resolved Hospital Problem list     Assessment & Plan:  Acute Hypoxemic Respiratory Failure Pulmonary Hypertension Possible Asthma Exacerbation vs Bronchitis due to viral infection History of OSA - goal SpO2 92% or higher for HFNC - CPAP at night or when sleeping during day - volume management with iHD - Hycodan cough syrup for cough - follow up respiratory viral panel  Non-Ischemic Cardiomyopathy Right Heart Failure Atrial Fibrillation Aortic Insufficiency Elevated Troponin - off coreg and bidil per Heart Failure team from earlier admission due to hypotension - HD for volume management - EKG not  concerning for ischemic changes - hold amiodarone due to elevation in liver enzymes at this time  - hold eliquis 2.5mg  for now  ESRD on HD Mild Hyponatremia Lactic Acidosis - consult nephrology in AM for iHD  needs  Anemia of Chronic Disease - transfuse for hemoglobin < 7g/dL  Abdominal Pain Cirrhosis Elevated Liver Enzymes - CT without acute pathology - Liver enzymes elevated in setting of hepatic congestion due to Right heart failure and hypoxemia - check RUQ [name] to rule out hepatic vein thrombosis - monitor liver enzymes - check amylase and lipase  Best Practice (right click and "Reselect all SmartList Selections" daily)   Diet/type: NPO w/ oral meds DVT prophylaxis: SCD GI prophylaxis: N/A Lines: N/A Foley:  N/A Code Status:  full code Last date of multidisciplinary goals of care discussion [discussed with patient at bedside 5/27 - wishes to be full code]  Labs   CBC: Recent Labs  Lab 09/28/22 1834 09/30/22 1515 10/02/22 1616 10/02/22 1740  WBC 5.9 5.0 14.2*  --   NEUTROABS  --  3.7 12.2*  --   HGB 11.9* 11.9* 12.5 15.0  HCT 38.0 37.6 38.6 44.0  MCV 90.9 90.0 87.7  --   PLT 145* 140* 147*  --     Basic Metabolic Panel: Recent Labs  Lab 09/28/22 2105 09/30/22 1515 10/02/22 1616 10/02/22 1740  NA 134* 130* 134* 131*  K 3.5 4.2 4.1 4.4  CL 94* 90* 90*  --   CO2 27 25 25   --   GLUCOSE 123* 148* 87  --   BUN 23 18 15   --   CREATININE 6.73* 5.95* 3.69*  --   CALCIUM 7.7* 7.9* 7.7*  --   MG  --   --  2.2  --    GFR: Estimated Creatinine Clearance: 10.5 mL/min (A) (by C-G formula based on SCr of 3.69 mg/dL (H)). Recent Labs  Lab 09/28/22 1834 09/30/22 1515 10/02/22 1616 10/02/22 1626 10/02/22 1813  WBC 5.9 5.0 14.2*  --   --   LATICACIDVEN  --   --   --  7.7* 7.6*    Liver Function Tests: Recent Labs  Lab 09/30/22 1515 10/02/22 1616  AST 31 1,308*  ALT 13 681*  ALKPHOS 85 102  BILITOT 0.8 3.1*  PROT 9.1* 9.3*  ALBUMIN 4.2 4.3   No results for input(s): "LIPASE", "AMYLASE" in the last 168 hours. No results for input(s): "AMMONIA" in the last 168 hours.  ABG    Component Value Date/Time   PHART 7.364 08/12/2017 1353   PCO2ART 43.1  08/12/2017 1353   PO2ART 79.3 (L) 08/12/2017 1353   HCO3 24.2 10/02/2022 1740   TCO2 26 10/02/2022 1740   ACIDBASEDEF 1.0 10/02/2022 1740   O2SAT 38 10/02/2022 1740     Coagulation Profile: No results for input(s): "INR", "PROTIME" in the last 168 hours.  Cardiac Enzymes: No results for input(s): "CKTOTAL", "CKMB", "CKMBINDEX", "TROPONINI" in the last 168 hours.  HbA1C: Hgb A1c MFr Bld  Date/Time Value Ref Range Status  09/05/2022 02:44 PM 5.5 4.6 - 6.5 % Final    Comment:    Glycemic Control Guidelines for People with Diabetes:Non Diabetic:  <6%Goal of Therapy: <7%Additional Action Suggested:  >8%   10/06/2021 11:59 AM 4.9 4.6 - 6.5 % Final    Comment:    Glycemic Control Guidelines for People with Diabetes:Non Diabetic:  <6%Goal of Therapy: <7%Additional Action Suggested:  >8%     CBG: No results for input(s): "GLUCAP" in  the last 168 hours.  Review of Systems:   A 10 point review of systems was performed and is negative unless stated in HPI  Past Medical History:  She,  has a past medical history of Anemia, Angiectasia, Angiodysplasia of small intestine (06/10/2020), Aortic atherosclerosis (HCC) (08/30/2017), Arthritis, Asthma, Biventricular ICD (implantable cardioverter-defibrillator) in place (01/08/2007), 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) (09/24/2020), Chronic obstructive pulmonary disease (HCC), Chronic systolic congestive heart failure (HCC), 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 (HCV) (HCC), Diabetes (HCC), Diverticulitis of left colon status post robotic low anterior to sigmoid resection 05/22/2018 (07/31/2017), Duodenal arteriovenous malformation, End stage renal disease (HCC) (03/22/2021), Esophageal varices (HCC), Glaucoma, Gout, History of blood transfusion (~ 11/2017), Hypertension associated with diabetes (HCC) (06/07/2017), Hypothyroidism, Internal hemorrhoids, LBBB (left bundle branch block), Malnutrition of  moderate degree (07/09/2020), MDD (major depressive disorder), recurrent, in partial remission (HCC) (06/07/2017), OBSTRUCTIVE SLEEP APNEA (11/14/2007), OSTEOPENIA (09/30/2008), Persistent atrial fibrillation (HCC) (02/06/2020), Polyneuropathy associated with underlying disease (HCC) (06/09/2021), Pulmonary hypertension (HCC) on echocardiogram (01/14/2018), Secondary esophageal varices without bleeding (HCC) (12/23/2020), Secondary hypercoagulable state (HCC) (02/06/2020), and Type 2 diabetes, controlled, with renal manifestation (HCC) (11/24/2013).   Surgical History:   Past Surgical History:  Procedure Laterality Date   A/V FISTULAGRAM N/A 03/28/2021   Procedure: A/V FISTULAGRAM;  Surgeon: Maeola Harman, MD;  Location: [facility] INVASIVE CV LAB;  Service: Cardiovascular;  Laterality: N/A;   APPENDECTOMY     AV FISTULA PLACEMENT Left 08/05/2020   Procedure: LEFT UPPER EXTREMITY ARTERIOVENOUS (AV) FISTULA CREATION;  Surgeon: Leonie Douglas, MD;  Location: [facility] OR;  Service: Vascular;  Laterality: Left;   BALLOON ENTEROSCOPY N/A 06/21/2022   Procedure: BALLOON ENTEROSCOPY;  Surgeon: Shellia Cleverly, DO;  Location: [facility] ENDOSCOPY;  Service: Gastroenterology;  Laterality: N/A;   BASCILIC VEIN TRANSPOSITION Left 09/16/2020   Procedure: LEFT SECOND STAGE BASCILIC VEIN TRANSPOSITION;  Surgeon: Leonie Douglas, MD;  Location: [facility] OR;  Service: Vascular;  Laterality: Left;  PERIPHERAL NERVE BLOCK   CARDIAC CATHETERIZATION     CARDIOVERSION N/A 12/14/2014   Procedure: CARDIOVERSION;  Surgeon: Lars Masson, MD;  Location: [facility] ENDOSCOPY;  Service: Cardiovascular;  Laterality: N/A;   CARDIOVERSION N/A 02/26/2017   Procedure: CARDIOVERSION;  Surgeon: Marinus Maw, MD;  Location: [facility] INVASIVE CV LAB;  Service: Cardiovascular;  Laterality: N/A;   CARDIOVERSION N/A 07/24/2017   Procedure: CARDIOVERSION;  Surgeon: Elease Hashimoto Deloris Ping, MD;  Location: [facility] ENDOSCOPY;  Service: Cardiovascular;  Laterality:  N/A;   CARDIOVERSION N/A 02/12/2020   Procedure: CARDIOVERSION;  Surgeon: Laurey Morale, MD;  Location: [facility] ENDOSCOPY;  Service: Cardiovascular;  Laterality: N/A;   CARDIOVERSION N/A 09/15/2021   Procedure: CARDIOVERSION;  Surgeon: Laurey Morale, MD;  Location: [facility] ENDOSCOPY;  Service: Cardiovascular;  Laterality: N/A;   CARPAL TUNNEL RELEASE  07/21/2021   COLONOSCOPY N/A 11/08/2017   Procedure: COLONOSCOPY;  Surgeon: Rachael Fee, MD;  Location: [facility] ENDOSCOPY;  Service: Endoscopy;  Laterality: N/A;   COLONOSCOPY WITH PROPOFOL N/A 05/31/2020   Procedure: COLONOSCOPY WITH PROPOFOL;  Surgeon: Hilarie Fredrickson, MD;  Location: [facility] ENDOSCOPY;  Service: Endoscopy;  Laterality: N/A;   COLONOSCOPY WITH PROPOFOL N/A 10/26/2021   Procedure: COLONOSCOPY WITH PROPOFOL;  Surgeon: Imogene Burn, MD;  Location: [facility] ENDOSCOPY;  Service: Gastroenterology;  Laterality: N/A;   DILATION AND CURETTAGE OF UTERUS     ENTEROSCOPY N/A 03/02/2020   Procedure: ENTEROSCOPY;  Surgeon: Benancio Deeds, MD;  Location: [facility] ENDOSCOPY;  Service: Gastroenterology;  Laterality: N/A;  ENTEROSCOPY N/A 06/08/2022   Procedure: ENTEROSCOPY;  Surgeon: Beverley Fiedler, MD;  Location: [facility] ENDOSCOPY;  Service: Gastroenterology;  Laterality: N/A;   EP IMPLANTABLE DEVICE N/A 10/06/2015   Procedure: BIV ICD Generator Changeout;  Surgeon: Duke Salvia, MD;  Location: [facility] INVASIVE CV LAB;  Service: Cardiovascular;  Laterality: N/A;   ESOPHAGOGASTRODUODENOSCOPY N/A 10/26/2017   Procedure: ESOPHAGOGASTRODUODENOSCOPY (EGD);  Surgeon: Meryl Dare, MD;  Location: [facility] ENDOSCOPY;  Service: Endoscopy;  Laterality: N/A;   ESOPHAGOGASTRODUODENOSCOPY (EGD) WITH PROPOFOL N/A 11/07/2017   Procedure: ESOPHAGOGASTRODUODENOSCOPY (EGD) WITH PROPOFOL;  Surgeon: Rachael Fee, MD;  Location: [facility] ENDOSCOPY;  Service: Endoscopy;  Laterality: N/A;   ESOPHAGOGASTRODUODENOSCOPY (EGD) WITH PROPOFOL N/A 05/29/2020   Procedure: ESOPHAGOGASTRODUODENOSCOPY (EGD)  WITH PROPOFOL;  Surgeon: Sherrilyn Rist, MD;  Location: [facility] ENDOSCOPY;  Service: Gastroenterology;  Laterality: N/A;   ESOPHAGOGASTRODUODENOSCOPY (EGD) WITH PROPOFOL N/A 10/26/2021   Procedure: ESOPHAGOGASTRODUODENOSCOPY (EGD) WITH PROPOFOL;  Surgeon: Imogene Burn, MD;  Location: [facility] ENDOSCOPY;  Service: Gastroenterology;  Laterality: N/A;   ESOPHAGOGASTRODUODENOSCOPY (EGD) WITH PROPOFOL N/A 03/08/2022   Procedure: ESOPHAGOGASTRODUODENOSCOPY (EGD) WITH PROPOFOL;  Surgeon: Lynann Bologna, MD;  Location: [facility] ENDOSCOPY;  Service: Gastroenterology;  Laterality: N/A;   GIVENS CAPSULE STUDY N/A 05/19/2020   Procedure: GIVENS CAPSULE STUDY;  Surgeon: Iva Boop, MD;  Location: [facility] ENDOSCOPY;  Service: Endoscopy;  Laterality: N/A;  .adm for obs since pacemaker, PA wil enter order and see pt   GIVENS CAPSULE STUDY N/A 05/29/2020   Procedure: GIVENS CAPSULE STUDY;  Surgeon: Sherrilyn Rist, MD;  Location: [facility] ENDOSCOPY;  Service: Gastroenterology;  Laterality: N/A;   GIVENS CAPSULE STUDY N/A 06/08/2022   Procedure: GIVENS CAPSULE STUDY;  Surgeon: Beverley Fiedler, MD;  Location: [facility] ENDOSCOPY;  Service: Gastroenterology;  Laterality: N/A;   HEMATOMA EVACUATION Left 09/24/2020   Procedure: EVACUATION HEMATOMA ARM;  Surgeon: Larina Earthly, MD;  Location: [facility] OR;  Service: Vascular;  Laterality: Left;   HEMOSTASIS CLIP PLACEMENT  06/21/2022   Procedure: HEMOSTASIS CLIP PLACEMENT;  Surgeon: Shellia Cleverly, DO;  Location: [facility] ENDOSCOPY;  Service: Gastroenterology;;   HOT HEMOSTASIS N/A 10/26/2017   Procedure: HOT HEMOSTASIS (ARGON PLASMA COAGULATION/BICAP);  Surgeon: Meryl Dare, MD;  Location: [facility] ENDOSCOPY;  Service: Endoscopy;  Laterality: N/A;   HOT HEMOSTASIS N/A 03/02/2020   Procedure: HOT HEMOSTASIS (ARGON PLASMA COAGULATION/BICAP);  Surgeon: Benancio Deeds, MD;  Location: [facility] ENDOSCOPY;  Service: Gastroenterology;  Laterality: N/A;   HOT HEMOSTASIS N/A 05/31/2020   Procedure: HOT HEMOSTASIS  (ARGON PLASMA COAGULATION/BICAP);  Surgeon: Hilarie Fredrickson, MD;  Location: [facility] ENDOSCOPY;  Service: Endoscopy;  Laterality: N/A;   HOT HEMOSTASIS N/A 03/08/2022   Procedure: HOT HEMOSTASIS (ARGON PLASMA COAGULATION/BICAP);  Surgeon: Lynann Bologna, MD;  Location: [facility] ENDOSCOPY;  Service: Gastroenterology;  Laterality: N/A;   HOT HEMOSTASIS N/A 06/08/2022   Procedure: HOT HEMOSTASIS (ARGON PLASMA COAGULATION/BICAP);  Surgeon: Beverley Fiedler, MD;  Location: [facility] ENDOSCOPY;  Service: Gastroenterology;  Laterality: N/A;   HOT HEMOSTASIS N/A 06/21/2022   Procedure: HOT HEMOSTASIS (ARGON PLASMA COAGULATION/BICAP);  Surgeon: Shellia Cleverly, DO;  Location: [facility] ENDOSCOPY;  Service: Gastroenterology;  Laterality: N/A;   ICD GENERATOR CHANGEOUT N/A 07/21/2022   Procedure: ICD GENERATOR CHANGEOUT;  Surgeon: Duke Salvia, MD;  Location: [facility] INVASIVE CV LAB;  Service: Cardiovascular;  Laterality: N/A;   INSERT / REPLACE / REMOVE PACEMAKER  ?2008   IR PERC TUN PERIT CATH WO PORT S&I /IMAG  07/05/2020   PERIPHERAL VASCULAR INTERVENTION  Left 03/28/2021   Procedure: PERIPHERAL VASCULAR INTERVENTION;  Surgeon: Maeola Harman, MD;  Location: [facility] INVASIVE CV LAB;  Service: Cardiovascular;  Laterality: Left;   POLYPECTOMY  11/08/2017   Procedure: POLYPECTOMY;  Surgeon: Rachael Fee, MD;  Location: [facility] ENDOSCOPY;  Service: Endoscopy;;   PROCTOSCOPY N/A 05/22/2018   Procedure: RIGID PROCTOSCOPY;  Surgeon: Karie Soda, MD;  Location: [facility] ORS;  Service: General;  Laterality: N/A;   RIGHT HEART CATH N/A 02/13/2018   Procedure: RIGHT HEART CATH;  Surgeon: Laurey Morale, MD;  Location: [facility] INVASIVE CV LAB;  Service: Cardiovascular;  Laterality: N/A;   RIGHT HEART CATH N/A 06/23/2020   Procedure: RIGHT HEART CATH;  Surgeon: Laurey Morale, MD;  Location: [facility] INVASIVE CV LAB;  Service: Cardiovascular;  Laterality: N/A;   RIGHT/LEFT HEART CATH AND CORONARY ANGIOGRAPHY N/A 12/11/2019   Procedure: RIGHT/LEFT HEART  CATH AND CORONARY ANGIOGRAPHY;  Surgeon: Laurey Morale, MD;  Location: [facility] INVASIVE CV LAB;  Service: Cardiovascular;  Laterality: N/A;   SUBMUCOSAL TATTOO INJECTION  03/02/2020   Procedure: SUBMUCOSAL TATTOO INJECTION;  Surgeon: Benancio Deeds, MD;  Location: [facility] ENDOSCOPY;  Service: Gastroenterology;;   SUBMUCOSAL TATTOO INJECTION  05/31/2020   Procedure: SUBMUCOSAL TATTOO INJECTION;  Surgeon: Hilarie Fredrickson, MD;  Location: [facility] ENDOSCOPY;  Service: Endoscopy;;   SUBMUCOSAL TATTOO INJECTION  06/21/2022   Procedure: SUBMUCOSAL TATTOO INJECTION;  Surgeon: Shellia Cleverly, DO;  Location: [facility] ENDOSCOPY;  Service: Gastroenterology;;   TEE WITHOUT CARDIOVERSION N/A 08/02/2021   Procedure: TRANSESOPHAGEAL ECHOCARDIOGRAM (TEE);  Surgeon: Laurey Morale, MD;  Location: [facility] ENDOSCOPY;  Service: Cardiovascular;  Laterality: N/A;   TUBAL LIGATION     XI ROBOTIC ASSISTED LOWER ANTERIOR RESECTION N/A 05/22/2018   Procedure: XI ROBOTIC ASSISTED SIGMOID COLOECTOMY MOBILIZATION OF SPLENIC FLEXURE, FIREFLY ASSESSMENT OF PERFUSION;  Surgeon: Karie Soda, MD;  Location: [facility] ORS;  Service: General;  Laterality: N/A;  ERAS PATHWAY     Social History:   reports that she has never smoked. She has never used smokeless tobacco. She reports that she does not currently use alcohol. She reports that she does not currently use drugs after having used the following drugs: Marijuana.   Family History:  Her family history includes Asthma in her father and sister; Heart attack in her father and mother; Lung cancer in her sister; Stroke in her brother. There is no history of Colon cancer, Esophageal cancer, Pancreatic cancer, Stomach cancer, or Liver disease.   Allergies No Known Allergies   Home Medications  Prior to Admission medications   Medication Sig Start Date End Date Taking? Authorizing Provider  albuterol (VENTOLIN HFA) 108 (90 Base) MCG/ACT inhaler Inhale 2 puffs into the lungs every 6 (six) hours as  needed for wheezing or shortness of breath. 04/07/22   Hunsucker, Lesia Sago, MD  amiodarone (PACERONE) 200 MG tablet Take 1 tablet by mouth once daily 08/30/22   Saguier, Ramon Dredge, PA-C  ammonium lactate (LAC-HYDRIN) 12 % lotion Apply daily on forearms. 09/05/22   Sharlene Dory, DO  amoxicillin-clavulanate (AUGMENTIN) 875-125 MG tablet Take 1 tablet by mouth 2 (two) times daily. 09/28/22   Clayborne [name], NP  apixaban (ELIQUIS) 2.5 MG TABS tablet Take 1 tablet (2.5 mg total) by mouth 2 (two) times daily. 03/11/22   Pokhrel, Rebekah Chesterfield, MD  cyclobenzaprine (FLEXERIL) 5 MG tablet Take 5 mg by mouth at bedtime as needed for muscle spasms. 12/07/21   [provider]  gabapentin (NEURONTIN) 300 MG capsule Take 1 capsule (  300 mg total) by mouth daily. Fill one rx. Computer may have sent over 2 Patient taking differently: Take 300 mg by mouth daily. 01/10/22   Saguier, Ramon Dredge, PA-C  HYDROcodone bit-homatropine (HYCODAN) 5-1.5 MG/5ML syrup Take 5 mLs by mouth every 8 (eight) hours as needed for up to 5 days for cough. 09/28/22 10/03/22  Clayborne [name], NP  ipratropium (ATROVENT HFA) 17 MCG/ACT inhaler Inhale 2 puffs into the lungs every 6 (six) hours as needed for wheezing. 03/24/22   Saguier, Ramon Dredge, PA-C  levothyroxine (SYNTHROID) 125 MCG tablet Take 1 tablet (125 mcg total) by mouth daily before breakfast. 01/16/22   Saguier, Ramon Dredge, PA-C  lidocaine-prilocaine (EMLA) cream Apply 1 Application topically as needed.    [provider]  methylPREDNISolone (MEDROL) 4 MG tablet Standard 6 day taper 09/28/22   Clayborne [name], NP  nitroGLYCERIN (NITROSTAT) 0.4 MG SL tablet Place 1 tablet (0.4 mg total) under the tongue every 5 (five) minutes as needed for chest pain. Patient not taking: Reported on 09/28/2022 10/27/21   Leroy Sea, MD  triamcinolone cream (KENALOG) 0.1 % Apply to shins twice dialy for 7 days. 09/05/22   Sharlene Dory, DO  Vitamin D, Ergocalciferol, (DRISDOL) 1.25 MG (50000  UNIT) CAPS capsule Take 1 capsule (50,000 Units total) by mouth every 7 (seven) days. 09/06/22   Sharlene Dory, DO     Critical care time: 45 minutes    Melody Comas, MD [facility] Office: [phone]   See Amion for personal pager PCCM on call pager [phone] until 7pm. Please call Elink 7p-7a. [phone]

## 2022-10-02 NOTE — Progress Notes (Signed)
Placed Patient on [place] Hi-Flo nasal cannula.  Patient's SPO2 is 97%.  RT will continue to monitor.

## 2022-10-03 ENCOUNTER — Inpatient Hospital Stay (HOSPITAL_COMMUNITY): Payer: 59

## 2022-10-03 ENCOUNTER — Ambulatory Visit: Payer: 59 | Admitting: Medical

## 2022-10-03 DIAGNOSIS — J9601 Acute respiratory failure with hypoxia: Secondary | ICD-10-CM | POA: Diagnosis not present

## 2022-10-03 DIAGNOSIS — R7989 Other specified abnormal findings of blood chemistry: Secondary | ICD-10-CM

## 2022-10-03 LAB — GLUCOSE, CAPILLARY
Glucose-Capillary: 120 mg/dL — ABNORMAL HIGH (ref 70–99)
Glucose-Capillary: 131 mg/dL — ABNORMAL HIGH (ref 70–99)
Glucose-Capillary: 132 mg/dL — ABNORMAL HIGH (ref 70–99)
Glucose-Capillary: 79 mg/dL (ref 70–99)
Glucose-Capillary: 81 mg/dL (ref 70–99)
Glucose-Capillary: 99 mg/dL (ref 70–99)

## 2022-10-03 LAB — COMPREHENSIVE METABOLIC PANEL
ALT: 962 U/L — ABNORMAL HIGH (ref 0–44)
AST: 1499 U/L — ABNORMAL HIGH (ref 15–41)
Albumin: 4 g/dL (ref 3.5–5.0)
Alkaline Phosphatase: 114 U/L (ref 38–126)
Anion gap: 24 — ABNORMAL HIGH (ref 5–15)
BUN: 32 mg/dL — ABNORMAL HIGH (ref 8–23)
CO2: 21 mmol/L — ABNORMAL LOW (ref 22–32)
Calcium: 8.1 mg/dL — ABNORMAL LOW (ref 8.9–10.3)
Chloride: 84 mmol/L — ABNORMAL LOW (ref 98–111)
Creatinine, Ser: 5.93 mg/dL — ABNORMAL HIGH (ref 0.44–1.00)
GFR, Estimated: 7 mL/min — ABNORMAL LOW (ref >60)
Glucose, Bld: 112 mg/dL — ABNORMAL HIGH (ref 70–99)
Potassium: 6.3 mmol/L (ref 3.5–5.1)
Sodium: 129 mmol/L — ABNORMAL LOW (ref 135–145)
Total Bilirubin: 2.7 mg/dL — ABNORMAL HIGH (ref 0.3–1.2)
Total Protein: 8.6 g/dL — ABNORMAL HIGH (ref 6.5–8.1)

## 2022-10-03 LAB — TYPE AND SCREEN
ABO/RH(D): O POS
Antibody Screen: NEGATIVE

## 2022-10-03 LAB — ALKALINE PHOSPHATASE: Alkaline Phosphatase: 96 U/L (ref 38–126)

## 2022-10-03 LAB — ECHOCARDIOGRAM COMPLETE
AR max vel: 1.05 cm2
AV Area VTI: 1.05 cm2
AV Area mean vel: 0.96 cm2
AV Mean grad: 7 mmHg
AV Peak grad: 13.4 mmHg
Ao pk vel: 1.83 m/s
Area-P 1/2: 2.97 cm2
P 1/2 time: 376 msec
S' Lateral: 2.6 cm
Weight: 2098.78 oz

## 2022-10-03 LAB — MRSA NEXT GEN BY PCR, NASAL
MRSA by PCR Next Gen: NOT DETECTED
MRSA by PCR Next Gen: NOT DETECTED

## 2022-10-03 LAB — CBC
HCT: 39.1 % (ref 36.0–46.0)
Hemoglobin: 12.9 g/dL (ref 12.0–15.0)
MCH: 28.5 pg (ref 26.0–34.0)
MCHC: 33 g/dL (ref 30.0–36.0)
MCV: 86.5 fL (ref 80.0–100.0)
Platelets: 139 10*3/uL — ABNORMAL LOW (ref 150–400)
RBC: 4.52 MIL/uL (ref 3.87–5.11)
RDW: 23.1 % — ABNORMAL HIGH (ref 11.5–15.5)
WBC: 12.1 10*3/uL — ABNORMAL HIGH (ref 4.0–10.5)
nRBC: 0.6 % — ABNORMAL HIGH (ref 0.0–0.2)

## 2022-10-03 LAB — BASIC METABOLIC PANEL
Anion gap: 21 — ABNORMAL HIGH (ref 5–15)
BUN: 38 mg/dL — ABNORMAL HIGH (ref 8–23)
CO2: 22 mmol/L (ref 22–32)
Calcium: 8.6 mg/dL — ABNORMAL LOW (ref 8.9–10.3)
Chloride: 86 mmol/L — ABNORMAL LOW (ref 98–111)
Creatinine, Ser: 6.45 mg/dL — ABNORMAL HIGH (ref 0.44–1.00)
GFR, Estimated: 6 mL/min — ABNORMAL LOW (ref >60)
Glucose, Bld: 84 mg/dL (ref 70–99)
Potassium: 4.6 mmol/L (ref 3.5–5.1)
Sodium: 129 mmol/L — ABNORMAL LOW (ref 135–145)

## 2022-10-03 LAB — BILIRUBIN, DIRECT: Bilirubin, Direct: 1.6 mg/dL — ABNORMAL HIGH (ref 0.0–0.2)

## 2022-10-03 LAB — PROCALCITONIN: Procalcitonin: 9.98 ng/mL

## 2022-10-03 LAB — HEPATITIS B SURFACE ANTIGEN: Hepatitis B Surface Ag: NONREACTIVE

## 2022-10-03 LAB — LIPASE, BLOOD: Lipase: 34 U/L (ref 11–51)

## 2022-10-03 LAB — PROTIME-INR
INR: 2.5 — ABNORMAL HIGH (ref 0.8–1.2)
Prothrombin Time: 27.4 seconds — ABNORMAL HIGH (ref 11.4–15.2)

## 2022-10-03 LAB — AMYLASE: Amylase: 1215 U/L — ABNORMAL HIGH (ref 28–100)

## 2022-10-03 LAB — LACTIC ACID, PLASMA
Lactic Acid, Venous: 8.9 mmol/L (ref 0.5–1.9)
Lactic Acid, Venous: 4.7 mmol/L (ref 0.5–1.9)

## 2022-10-03 LAB — APTT: aPTT: 46 seconds — ABNORMAL HIGH (ref 24–36)

## 2022-10-03 LAB — PHOSPHORUS: Phosphorus: 6 mg/dL — ABNORMAL HIGH (ref 2.5–4.6)

## 2022-10-03 LAB — TROPONIN I (HIGH SENSITIVITY): Troponin I (High Sensitivity): 227 ng/L (ref <18)

## 2022-10-03 MED ORDER — DEXTROSE 50 % IV SOLN
1.0000 | Freq: Once | INTRAVENOUS | Status: AC
  Administered 2022-10-03: 50 mL via INTRAVENOUS
  Filled 2022-10-03: qty 50

## 2022-10-03 MED ORDER — CALCIUM GLUCONATE-NACL 2-0.675 GM/100ML-% IV SOLN
2.0000 g | Freq: Once | INTRAVENOUS | Status: AC
  Administered 2022-10-03: 2000 mg via INTRAVENOUS
  Filled 2022-10-03: qty 100

## 2022-10-03 MED ORDER — DOXERCALCIFEROL 4 MCG/2ML IV SOLN
4.0000 ug | INTRAVENOUS | Status: DC
  Administered 2022-10-04 – 2022-10-16 (×6): 4 ug via INTRAVENOUS
  Filled 2022-10-03 (×11): qty 2

## 2022-10-03 MED ORDER — LEVOTHYROXINE SODIUM 75 MCG PO TABS
125.0000 ug | ORAL_TABLET | Freq: Every day | ORAL | Status: DC
  Administered 2022-10-03: 125 ug via ORAL
  Filled 2022-10-03 (×3): qty 1

## 2022-10-03 MED ORDER — CINACALCET HCL 30 MG PO TABS
30.0000 mg | ORAL_TABLET | ORAL | Status: DC
  Filled 2022-10-03 (×2): qty 1

## 2022-10-03 MED ORDER — CHLORHEXIDINE GLUCONATE CLOTH 2 % EX PADS: 6.0000 | MEDICATED_PAD | Freq: Every day | CUTANEOUS | Status: DC

## 2022-10-03 MED ORDER — HYDROMORPHONE HCL 1 MG/ML IJ SOLN
0.5000 mg | Freq: Once | INTRAMUSCULAR | Status: AC | PRN
  Administered 2022-10-03: 0.5 mg via INTRAVENOUS
  Filled 2022-10-03: qty 0.5

## 2022-10-03 MED ORDER — SODIUM ZIRCONIUM CYCLOSILICATE 10 G PO PACK
10.0000 g | PACK | Freq: Once | ORAL | Status: AC
  Administered 2022-10-03: 10 g via ORAL
  Filled 2022-10-03: qty 1

## 2022-10-03 MED ORDER — INSULIN ASPART 100 UNIT/ML IV SOLN
10.0000 [IU] | Freq: Once | INTRAVENOUS | Status: AC
  Administered 2022-10-03: 10 [IU] via INTRAVENOUS

## 2022-10-03 MED ORDER — HYDROMORPHONE HCL 1 MG/ML IJ SOLN
0.5000 mg | INTRAMUSCULAR | Status: DC | PRN
  Administered 2022-10-03: 0.5 mg via INTRAVENOUS
  Filled 2022-10-03 (×2): qty 0.5

## 2022-10-03 MED ORDER — BENZONATATE 100 MG PO CAPS
200.0000 mg | ORAL_CAPSULE | Freq: Three times a day (TID) | ORAL | Status: DC | PRN
  Administered 2022-10-03: 200 mg via ORAL
  Filled 2022-10-03 (×2): qty 2

## 2022-10-03 MED ORDER — IOHEXOL 350 MG/ML SOLN
75.0000 mL | Freq: Once | INTRAVENOUS | Status: AC | PRN
  Administered 2022-10-03: 75 mL via INTRAVENOUS

## 2022-10-03 MED ORDER — HEPARIN SODIUM (PORCINE) 5000 UNIT/ML IJ SOLN
5000.0000 [IU] | Freq: Three times a day (TID) | INTRAMUSCULAR | Status: DC
  Administered 2022-10-03 – 2022-10-05 (×7): 5000 [IU] via SUBCUTANEOUS
  Filled 2022-10-03 (×7): qty 1

## 2022-10-03 NOTE — Consult Note (Signed)
Surgical Evaluation Requesting provider: Vernona Rieger Gleason PA-C  Chief Complaint: abdominal pain  HPI: This is a 77 year old woman with a history of nonischemic cardiomyopathy, chronic combined systolic/diastolic heart failure, A-fib/a flutter/A. tach on Eliquis, history of GI bleeding due to AVMs/diverticulitis, chronic anemia, ESRD on hemodialysis, compensated cirrhosis related to hepatitis C-followed at [facility]-aortic atherosclerosis, biventricular ICD in place, COPD, obesity, obstructive sleep apnea, pulmonary hypertension, type 2 diabetes, polyneuropathy who has been struggling a cough for many months at this point who presented about 5 days ago (5/23) with progressively worsening URI symptoms, shortness of breath, cough and concerns for volume overload.  She was treated with oral antibiotics, steroids and breathing treatments but had no improvement with this.  She returned to the ER on 5/25 and again on 5/27, the latter with hypoxia and tachypnea.  Workup yesterday included CTA which was negative for PE, cardiomegaly, pulmonary parenchymal changes, stranding in the right upper quadrant medial to the liver.  She was noted to have a lactate of 7.7 and a white count of 14.2, troponins elevated in the 140s to 150s, markedly elevated LFTs compared to baseline she then developed abdominal pain and a CTA of the abdomen pelvis was completed which did not show any vascular pathology or other acute pathology.  This was at approximately 7 PM yesterday, not quite 24 hours ago.  Of note on review of prior records, she did have a CT in February that describes possibly a congenital internal hernia but this is not mentioned on the current CT.  Her abdominal pain has persisted.  Per her daughter she has actually been having mid abdominal pain for several days.  Her lactate trended up to 8.9 overnight, it is down to 4.7 this afternoon.  Has uptrending troponins most recently 227 this morning, hyperkalemia 6.3,  CO2 21, anion gap of 24, AST is now 1500, ALT 962, total bilirubin 2.7, WBC 12.1, platelets 139, INR is 2.5.  Echo demonstrates ejection fraction of 40 to 45%.  No Known Allergies  Past Medical History:  Diagnosis Date   Anemia    Angiectasia    Angiodysplasia of small intestine 06/10/2020   Ileum - seen on capsule endoscopy 05/2020 - ablated at colonoscopy   Aortic atherosclerosis (HCC) 08/30/2017   Arthritis    "qwhere" (01/03/2018)   Asthma    reports mild asthma since childhood - had COPD on dx list from prior PCP   Biventricular ICD (implantable cardioverter-defibrillator) in place 01/08/2007   Qualifier: Diagnosis of  By: Daphine Deutscher FNP, [place]     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) 09/24/2020   Chronic obstructive pulmonary disease (HCC)    Chronic systolic congestive heart failure (HCC)    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 (HCV) (HCC)    HEPATITIS C - s/p treatment with Harvoni, saw hepatology, [facility]   Diabetes [facility])    Diverticulitis of left colon status post robotic low anterior to sigmoid resection 05/22/2018 07/31/2017   Duodenal arteriovenous malformation    End stage renal disease (HCC) 03/22/2021   Esophageal varices (HCC)    Glaucoma    Gout    History of blood transfusion ~ 11/2017   Hypertension associated with diabetes (HCC) 06/07/2017   Hypothyroidism    Internal hemorrhoids    LBBB (left bundle branch block)    Malnutrition of moderate degree 07/09/2020   MDD (major depressive disorder), recurrent, in partial remission (HCC) 06/07/2017   OBSTRUCTIVE SLEEP APNEA 11/14/2007   no  CPAP   OSTEOPENIA 09/30/2008   Persistent atrial fibrillation (HCC) 02/06/2020   Polyneuropathy associated with underlying disease (HCC) 06/09/2021   Pulmonary hypertension (HCC) on echocardiogram 01/14/2018   Secondary esophageal varices without bleeding (HCC) 12/23/2020   Formatting of this note might be different from  the original. Patient had trace esophageal varices during small bowel enteroscopy in October 2021, follow-up EGD in January 2022 with no varices.  She previously had GI bleeding thought to be from AVM versus GAVE.  She is no longer on carvedilol and will need surveillance endoscopy.  Patient was previously followed at [facility] and I have asked her    Secondary hypercoagulable state (HCC) 02/06/2020   Type 2 diabetes, controlled, with renal manifestation (HCC) 11/24/2013    Past Surgical History:  Procedure Laterality Date   A/V FISTULAGRAM N/A 03/28/2021   Procedure: A/V FISTULAGRAM;  Surgeon: Maeola Harman, MD;  Location: [facility] INVASIVE CV LAB;  Service: Cardiovascular;  Laterality: N/A;   APPENDECTOMY     AV FISTULA PLACEMENT Left 08/05/2020   Procedure: LEFT UPPER EXTREMITY ARTERIOVENOUS (AV) FISTULA CREATION;  Surgeon: Leonie Douglas, MD;  Location: [facility] OR;  Service: Vascular;  Laterality: Left;   BALLOON ENTEROSCOPY N/A 06/21/2022   Procedure: BALLOON ENTEROSCOPY;  Surgeon: Shellia Cleverly, DO;  Location: [facility] ENDOSCOPY;  Service: Gastroenterology;  Laterality: N/A;   BASCILIC VEIN TRANSPOSITION Left 09/16/2020   Procedure: LEFT SECOND STAGE BASCILIC VEIN TRANSPOSITION;  Surgeon: Leonie Douglas, MD;  Location: [facility] OR;  Service: Vascular;  Laterality: Left;  PERIPHERAL NERVE BLOCK   CARDIAC CATHETERIZATION     CARDIOVERSION N/A 12/14/2014   Procedure: CARDIOVERSION;  Surgeon: Lars Masson, MD;  Location: [facility] ENDOSCOPY;  Service: Cardiovascular;  Laterality: N/A;   CARDIOVERSION N/A 02/26/2017   Procedure: CARDIOVERSION;  Surgeon: Marinus Maw, MD;  Location: [facility] INVASIVE CV LAB;  Service: Cardiovascular;  Laterality: N/A;   CARDIOVERSION N/A 07/24/2017   Procedure: CARDIOVERSION;  Surgeon: Elease Hashimoto Deloris Ping, MD;  Location: [facility] ENDOSCOPY;  Service: Cardiovascular;  Laterality: N/A;   CARDIOVERSION N/A 02/12/2020   Procedure: CARDIOVERSION;  Surgeon: Laurey Morale, MD;   Location: [facility] ENDOSCOPY;  Service: Cardiovascular;  Laterality: N/A;   CARDIOVERSION N/A 09/15/2021   Procedure: CARDIOVERSION;  Surgeon: Laurey Morale, MD;  Location: [facility] ENDOSCOPY;  Service: Cardiovascular;  Laterality: N/A;   CARPAL TUNNEL RELEASE  07/21/2021   COLONOSCOPY N/A 11/08/2017   Procedure: COLONOSCOPY;  Surgeon: Rachael Fee, MD;  Location: [facility] ENDOSCOPY;  Service: Endoscopy;  Laterality: N/A;   COLONOSCOPY WITH PROPOFOL N/A 05/31/2020   Procedure: COLONOSCOPY WITH PROPOFOL;  Surgeon: Hilarie Fredrickson, MD;  Location: [facility] ENDOSCOPY;  Service: Endoscopy;  Laterality: N/A;   COLONOSCOPY WITH PROPOFOL N/A 10/26/2021   Procedure: COLONOSCOPY WITH PROPOFOL;  Surgeon: Imogene Burn, MD;  Location: [facility] ENDOSCOPY;  Service: Gastroenterology;  Laterality: N/A;   DILATION AND CURETTAGE OF UTERUS     ENTEROSCOPY N/A 03/02/2020   Procedure: ENTEROSCOPY;  Surgeon: Benancio Deeds, MD;  Location: [facility] ENDOSCOPY;  Service: Gastroenterology;  Laterality: N/A;   ENTEROSCOPY N/A 06/08/2022   Procedure: ENTEROSCOPY;  Surgeon: Beverley Fiedler, MD;  Location: [facility] ENDOSCOPY;  Service: Gastroenterology;  Laterality: N/A;   EP IMPLANTABLE DEVICE N/A 10/06/2015   Procedure: BIV ICD Generator Changeout;  Surgeon: Duke Salvia, MD;  Location: [facility] INVASIVE CV LAB;  Service: Cardiovascular;  Laterality: N/A;   ESOPHAGOGASTRODUODENOSCOPY N/A 10/26/2017   Procedure: ESOPHAGOGASTRODUODENOSCOPY (EGD);  Surgeon: Meryl Dare, MD;  Location: [facility] ENDOSCOPY;  Service: Endoscopy;  Laterality: N/A;   ESOPHAGOGASTRODUODENOSCOPY (EGD) WITH PROPOFOL N/A 11/07/2017   Procedure: ESOPHAGOGASTRODUODENOSCOPY (EGD) WITH PROPOFOL;  Surgeon: Rachael Fee, MD;  Location: [facility] ENDOSCOPY;  Service: Endoscopy;  Laterality: N/A;   ESOPHAGOGASTRODUODENOSCOPY (EGD) WITH PROPOFOL N/A 05/29/2020   Procedure: ESOPHAGOGASTRODUODENOSCOPY (EGD) WITH PROPOFOL;  Surgeon: Sherrilyn Rist, MD;  Location: [facility] ENDOSCOPY;  Service:  Gastroenterology;  Laterality: N/A;   ESOPHAGOGASTRODUODENOSCOPY (EGD) WITH PROPOFOL N/A 10/26/2021   Procedure: ESOPHAGOGASTRODUODENOSCOPY (EGD) WITH PROPOFOL;  Surgeon: Imogene Burn, MD;  Location: [facility] ENDOSCOPY;  Service: Gastroenterology;  Laterality: N/A;   ESOPHAGOGASTRODUODENOSCOPY (EGD) WITH PROPOFOL N/A 03/08/2022   Procedure: ESOPHAGOGASTRODUODENOSCOPY (EGD) WITH PROPOFOL;  Surgeon: Lynann Bologna, MD;  Location: [facility] ENDOSCOPY;  Service: Gastroenterology;  Laterality: N/A;   GIVENS CAPSULE STUDY N/A 05/19/2020   Procedure: GIVENS CAPSULE STUDY;  Surgeon: Iva Boop, MD;  Location: [facility] ENDOSCOPY;  Service: Endoscopy;  Laterality: N/A;  .adm for obs since pacemaker, PA wil enter order and see pt   GIVENS CAPSULE STUDY N/A 05/29/2020   Procedure: GIVENS CAPSULE STUDY;  Surgeon: Sherrilyn Rist, MD;  Location: [facility] ENDOSCOPY;  Service: Gastroenterology;  Laterality: N/A;   GIVENS CAPSULE STUDY N/A 06/08/2022   Procedure: GIVENS CAPSULE STUDY;  Surgeon: Beverley Fiedler, MD;  Location: [facility] ENDOSCOPY;  Service: Gastroenterology;  Laterality: N/A;   HEMATOMA EVACUATION Left 09/24/2020   Procedure: EVACUATION HEMATOMA ARM;  Surgeon: Larina Earthly, MD;  Location: [facility] OR;  Service: Vascular;  Laterality: Left;   HEMOSTASIS CLIP PLACEMENT  06/21/2022   Procedure: HEMOSTASIS CLIP PLACEMENT;  Surgeon: Shellia Cleverly, DO;  Location: [facility] ENDOSCOPY;  Service: Gastroenterology;;   HOT HEMOSTASIS N/A 10/26/2017   Procedure: HOT HEMOSTASIS (ARGON PLASMA COAGULATION/BICAP);  Surgeon: Meryl Dare, MD;  Location: [facility] ENDOSCOPY;  Service: Endoscopy;  Laterality: N/A;   HOT HEMOSTASIS N/A 03/02/2020   Procedure: HOT HEMOSTASIS (ARGON PLASMA COAGULATION/BICAP);  Surgeon: Benancio Deeds, MD;  Location: [facility] ENDOSCOPY;  Service: Gastroenterology;  Laterality: N/A;   HOT HEMOSTASIS N/A 05/31/2020   Procedure: HOT HEMOSTASIS (ARGON PLASMA COAGULATION/BICAP);  Surgeon: Hilarie Fredrickson, MD;  Location: [facility]  ENDOSCOPY;  Service: Endoscopy;  Laterality: N/A;   HOT HEMOSTASIS N/A 03/08/2022   Procedure: HOT HEMOSTASIS (ARGON PLASMA COAGULATION/BICAP);  Surgeon: Lynann Bologna, MD;  Location: [facility] ENDOSCOPY;  Service: Gastroenterology;  Laterality: N/A;   HOT HEMOSTASIS N/A 06/08/2022   Procedure: HOT HEMOSTASIS (ARGON PLASMA COAGULATION/BICAP);  Surgeon: Beverley Fiedler, MD;  Location: [facility] ENDOSCOPY;  Service: Gastroenterology;  Laterality: N/A;   HOT HEMOSTASIS N/A 06/21/2022   Procedure: HOT HEMOSTASIS (ARGON PLASMA COAGULATION/BICAP);  Surgeon: Shellia Cleverly, DO;  Location[facility] ENDOSCOPY;  Service: Gastroenterology;  Laterality: N/A;   ICD GENERATOR CHANGEOUT N/A 07/21/2022   Procedure: ICD GENERATOR CHANGEOUT;  Surgeon: Duke Salvia, MD;  Location: [facility] INVASIVE CV LAB;  Service: Cardiovascular;  Laterality: N/A;   INSERT / REPLACE / REMOVE PACEMAKER  ?2008   IR PERC TUN PERIT CATH WO PORT S&I /IMAG  07/05/2020   PERIPHERAL VASCULAR INTERVENTION Left 03/28/2021   Procedure: PERIPHERAL VASCULAR INTERVENTION;  Surgeon: Maeola Harman, MD;  Location: [facility] INVASIVE CV LAB;  Service: Cardiovascular;  Laterality: Left;   POLYPECTOMY  11/08/2017   Procedure: POLYPECTOMY;  Surgeon: Rachael Fee, MD;  Location: [facility] ENDOSCOPY;  Service: Endoscopy;;   PROCTOSCOPY N/A 05/22/2018   Procedure: RIGID PROCTOSCOPY;  Surgeon: Karie Soda, MD;  Location: [facility] ORS;  Service: General;  Laterality:  N/A;   RIGHT HEART CATH N/A 02/13/2018   Procedure: RIGHT HEART CATH;  Surgeon: Laurey Morale, MD;  Location: [facility] INVASIVE CV LAB;  Service: Cardiovascular;  Laterality: N/A;   RIGHT HEART CATH N/A 06/23/2020   Procedure: RIGHT HEART CATH;  Surgeon: Laurey Morale, MD;  Location: [facility] INVASIVE CV LAB;  Service: Cardiovascular;  Laterality: N/A;   RIGHT/LEFT HEART CATH AND CORONARY ANGIOGRAPHY N/A 12/11/2019   Procedure: RIGHT/LEFT HEART CATH AND CORONARY ANGIOGRAPHY;  Surgeon: Laurey Morale, MD;  Location: [facility]  INVASIVE CV LAB;  Service: Cardiovascular;  Laterality: N/A;   SUBMUCOSAL TATTOO INJECTION  03/02/2020   Procedure: SUBMUCOSAL TATTOO INJECTION;  Surgeon: Benancio Deeds, MD;  Location: [facility] ENDOSCOPY;  Service: Gastroenterology;;   SUBMUCOSAL TATTOO INJECTION  05/31/2020   Procedure: SUBMUCOSAL TATTOO INJECTION;  Surgeon: Hilarie Fredrickson, MD;  Location: [facility] ENDOSCOPY;  Service: Endoscopy;;   SUBMUCOSAL TATTOO INJECTION  06/21/2022   Procedure: SUBMUCOSAL TATTOO INJECTION;  Surgeon: Shellia Cleverly, DO;  Location: [facility] ENDOSCOPY;  Service: Gastroenterology;;   TEE WITHOUT CARDIOVERSION N/A 08/02/2021   Procedure: TRANSESOPHAGEAL ECHOCARDIOGRAM (TEE);  Surgeon: Laurey Morale, MD;  Location: [facility] ENDOSCOPY;  Service: Cardiovascular;  Laterality: N/A;   TUBAL LIGATION     XI ROBOTIC ASSISTED LOWER ANTERIOR RESECTION N/A 05/22/2018   Procedure: XI ROBOTIC ASSISTED SIGMOID COLOECTOMY MOBILIZATION OF SPLENIC FLEXURE, FIREFLY ASSESSMENT OF PERFUSION;  Surgeon: Karie Soda, MD;  Location: [facility] ORS;  Service: General;  Laterality: N/A;  ERAS PATHWAY    Family History  Problem Relation Age of Onset   Asthma Father    Heart attack Father    Asthma Sister    Lung cancer Sister    Heart attack Mother    Stroke Brother    Colon cancer Neg Hx    Esophageal cancer Neg Hx    Pancreatic cancer Neg Hx    Stomach cancer Neg Hx    Liver disease Neg Hx     Social History   Socioeconomic History   Marital status: Divorced    Spouse name: Not on file   Number of children: 1   Years of education: 12   Highest education level: 12th grade  Occupational History   Not on file  Tobacco Use   Smoking status: Never   Smokeless tobacco: Never  Vaping Use   Vaping Use: Never used  Substance and Sexual Activity   Alcohol use: Not Currently    Comment: 01/03/2018 "couple drinks/month"   Drug use: Not Currently    Types: Marijuana    Comment: VERY INTERMITTENT    Sexual activity: Not Currently  Other  Topics Concern   Not on file  Social History Narrative   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      Home Situation: lives alone      Spiritual Beliefs: Christian - [employer]      Lifestyle: no regular exercise; poor diet      She is divorced at least one adult child   Never smoker    rare alcohol to occasional at best    no substance abuse         Social Determinants of Health   Financial Resource Strain: Low Risk  (09/04/2022)   Overall Financial Resource Strain (CARDIA)    Difficulty of Paying Living Expenses: Not hard at all  Food Insecurity: No Food Insecurity (09/04/2022)   Hunger Vital Sign    Worried About Running Out of Food in the Last Year:  Never true    Ran Out of Food in the Last Year: Never true  Transportation Needs: No Transportation Needs (09/04/2022)   PRAPARE - Administrator, Civil Service (Medical): No    Lack of Transportation (Non-Medical): No  Physical Activity: Insufficiently Active (09/04/2022)   Exercise Vital Sign    Days of Exercise per Week: 1 day    Minutes of Exercise per Session: 20 min  Stress: Stress Concern Present (09/04/2022)   [employer] of Occupational Health - Occupational Stress Questionnaire    Feeling of Stress : To some extent  Social Connections: Moderately Isolated (09/04/2022)   Social Connection and Isolation Panel [NHANES]    Frequency of Communication with Friends and Family: More than three times a week    Frequency of Social Gatherings with Friends and Family: Three times a week    Attends Religious Services: More than 4 times per year    Active Member of Clubs or Organizations: No    Attends Banker Meetings: Not on file    Marital Status: Divorced    No current facility-administered medications on file prior to encounter.   Current Outpatient Medications on File Prior to Encounter  Medication Sig Dispense Refill   albuterol (VENTOLIN HFA) 108 (90 Base) MCG/ACT inhaler Inhale 2 puffs  into the lungs every 6 (six) hours as needed for wheezing or shortness of breath. 8 g 6   amiodarone (PACERONE) 200 MG tablet Take 1 tablet by mouth once daily 90 tablet 0   ammonium lactate (LAC-HYDRIN) 12 % lotion Apply daily on forearms. 400 g 0   amoxicillin-clavulanate (AUGMENTIN) 875-125 MG tablet Take 1 tablet by mouth 2 (two) times daily. 20 tablet 0   apixaban (ELIQUIS) 2.5 MG TABS tablet Take 1 tablet (2.5 mg total) by mouth 2 (two) times daily. 60 tablet 11   cyclobenzaprine (FLEXERIL) 5 MG tablet Take 5 mg by mouth at bedtime as needed for muscle spasms.     gabapentin (NEURONTIN) 300 MG capsule Take 1 capsule (300 mg total) by mouth daily. Fill one rx. Computer may have sent over 2 (Patient taking differently: Take 300 mg by mouth daily.) 90 capsule 2   HYDROcodone bit-homatropine (HYCODAN) 5-1.5 MG/5ML syrup Take 5 mLs by mouth every 8 (eight) hours as needed for up to 5 days for cough. 75 mL 0   ipratropium (ATROVENT HFA) 17 MCG/ACT inhaler Inhale 2 puffs into the lungs every 6 (six) hours as needed for wheezing. 12.9 g 1   levothyroxine (SYNTHROID) 125 MCG tablet Take 1 tablet (125 mcg total) by mouth daily before breakfast. 30 tablet 11   lidocaine-prilocaine (EMLA) cream Apply 1 Application topically as needed.     methylPREDNISolone (MEDROL) 4 MG tablet Standard 6 day taper 21 tablet 0   nitroGLYCERIN (NITROSTAT) 0.4 MG SL tablet Place 1 tablet (0.4 mg total) under the tongue every 5 (five) minutes as needed for chest pain. (Patient not taking: Reported on 09/28/2022) 25 tablet 0   triamcinolone cream (KENALOG) 0.1 % Apply to shins twice dialy for 7 days. 30 g 0   Vitamin D, Ergocalciferol, (DRISDOL) 1.25 MG (50000 UNIT) CAPS capsule Take 1 capsule (50,000 Units total) by mouth every 7 (seven) days. 12 capsule 0    Review of Systems: a complete, 10pt review of systems was completed with pertinent positives and negatives as documented in the HPI  Physical Exam: Vitals:    10/03/22 1325 10/03/22 1500  BP:    Pulse:  Resp:    Temp:  97.7 F (36.5 C)  SpO2: 93%    Gen: Alert, tachypneic, in distress Heart rate is in the 80s, paced.  She is normotensive, not currently on any pressors. Abdomen is soft, nondistended, but globally tender      Latest Ref Rng & Units 10/03/2022    2:40 PM 10/02/2022    5:40 PM 10/02/2022    4:16 PM  CBC  WBC 4.0 - 10.5 K/uL 12.1   14.2   Hemoglobin 12.0 - 15.0 g/dL 16.1  09.6  04.5   Hematocrit 36.0 - 46.0 % 39.1  44.0  38.6   Platelets 150 - 400 K/uL 139   147        Latest Ref Rng & Units 10/03/2022    2:40 PM 10/03/2022   12:17 AM 10/02/2022    5:40 PM  CMP  Glucose 70 - 99 mg/dL 409     BUN 8 - 23 mg/dL 32     Creatinine 8.11 - 1.00 mg/dL 9.14     Sodium 782 - 956 mmol/L 129   131   Potassium 3.5 - 5.1 mmol/L 6.3   4.4   Chloride 98 - 111 mmol/L 84     CO2 22 - 32 mmol/L 21     Calcium 8.9 - 10.3 mg/dL 8.1     Total Protein 6.5 - 8.1 g/dL 8.6     Total Bilirubin 0.3 - 1.2 mg/dL 2.7     Alkaline Phos 38 - 126 U/L 114  96    AST 15 - 41 U/L 1,499     ALT 0 - 44 U/L 962       Lab Results  Component Value Date   INR 2.5 (H) 10/03/2022   INR 1.1 (H) 09/14/2020   INR 2.1 (H) 05/27/2020    Imaging: ECHOCARDIOGRAM COMPLETE  Result Date: 10/03/2022    ECHOCARDIOGRAM REPORT   Patient Name:   ORDELLA BECHTOLD Date of Exam: 10/03/2022 Medical Rec #:  [id number]         Height:       59.0 in Accession #:    [id number]        Weight:       131.2 lb Date of Birth:  1945/05/27         BSA:          1.542 m Patient Age:    76 years          BP:           123/71 mmHg Patient Gender: F                 HR:           86 bpm. Exam Location:  Inpatient Procedure: 2D Echo, Color Doppler and Cardiac Doppler Indications:    Elevated Troponin  History:        Patient has prior history of Echocardiogram examinations, most                 recent 06/24/2020. Pulmonary HTN, Arrythmias:LBBB,                 Signs/Symptoms:Dyspnea; Risk  Factors:Diabetes and Hypertension.  Sonographer:    Darlys Gales Referring Phys: [phone] LAURA R GLEASON IMPRESSIONS  1. Left ventricular ejection fraction, by estimation, is 40 to 45%. The left ventricle has mildly decreased function. The left ventricle demonstrates global hypokinesis. There is moderate concentric left ventricular hypertrophy. Left ventricular diastolic function could not  be evaluated. There is the interventricular septum is flattened in systole and diastole, consistent with right ventricular pressure and volume overload.  2. Right ventricular systolic function is mildly reduced. The right ventricular size is moderately enlarged. There is severely elevated pulmonary artery systolic pressure. The estimated right ventricular systolic pressure is 74.3 mmHg.  3. Left atrial size was moderately dilated.  4. Right atrial size was mildly dilated.  5. The mitral valve is grossly normal. Mild to moderate mitral valve regurgitation. No evidence of mitral stenosis.  6. The tricuspid valve is abnormal. Tricuspid valve regurgitation is severe.  7. The aortic valve is tricuspid. Aortic valve regurgitation is moderate. No aortic stenosis is present.  8. The inferior vena cava is dilated in size with <50% respiratory variability, suggesting right atrial pressure of 15 mmHg. Comparison(s): Changes from prior study are noted. LVEF 40-45% which is slightly reduced from 50-55%. Severe TR. AI remains moderate. FINDINGS  Left Ventricle: Left ventricular ejection fraction, by estimation, is 40 to 45%. The left ventricle has mildly decreased function. The left ventricle demonstrates global hypokinesis. The left ventricular internal cavity size was normal in size. There is  moderate concentric left ventricular hypertrophy. The interventricular septum is flattened in systole and diastole, consistent with right ventricular pressure and volume overload. Left ventricular diastolic function could not be evaluated due to  nondiagnostic images. Left ventricular diastolic function could not be evaluated. Right Ventricle: The right ventricular size is moderately enlarged. No increase in right ventricular wall thickness. Right ventricular systolic function is mildly reduced. There is severely elevated pulmonary artery systolic pressure. The tricuspid regurgitant velocity is 3.85 m/s, and with an assumed right atrial pressure of 15 mmHg, the estimated right ventricular systolic pressure is 74.3 mmHg. Left Atrium: Left atrial size was moderately dilated. Right Atrium: Right atrial size was mildly dilated. Pericardium: There is no evidence of pericardial effusion. Mitral Valve: The mitral valve is grossly normal. Mild to moderate mitral valve regurgitation. No evidence of mitral valve stenosis. Tricuspid Valve: The tricuspid valve is abnormal. Tricuspid valve regurgitation is severe. No evidence of tricuspid stenosis. The flow in the hepatic veins is reversed during ventricular systole. Aortic Valve: The aortic valve is tricuspid. Aortic valve regurgitation is moderate. Aortic regurgitation PHT measures 376 msec. No aortic stenosis is present. Aortic valve mean gradient measures 7.0 mmHg. Aortic valve peak gradient measures 13.4 mmHg. Aortic valve area, by VTI measures 1.05 cm. Pulmonic Valve: The pulmonic valve was grossly normal. Pulmonic valve regurgitation is not visualized. No evidence of pulmonic stenosis. Aorta: The aortic root is normal in size and structure. Venous: The inferior vena cava is dilated in size with less than 50% respiratory variability, suggesting right atrial pressure of 15 mmHg. IAS/Shunts: The atrial septum is grossly normal. Additional Comments: A device lead is visualized in the right atrium and right ventricle.  LEFT VENTRICLE PLAX 2D LVIDd:         3.60 cm   Diastology LVIDs:         2.60 cm   LV e' medial:    6.85 cm/s LV PW:         1.40 cm   LV E/e' medial:  14.5 LV IVS:        1.30 cm   LV e' lateral:    8.38 cm/s LVOT diam:     1.80 cm   LV E/e' lateral: 11.8 LV SV:         31 LV SV Index:   20 LVOT  Area:     2.54 cm  RIGHT VENTRICLE RV S prime:     13.40 cm/s TAPSE (M-mode): 1.6 cm LEFT ATRIUM             Index        RIGHT ATRIUM           Index LA Vol (A2C):   49.2 ml 31.91 ml/m  RA Area:     20.60 cm LA Vol (A4C):   72.0 ml 46.70 ml/m  RA Volume:   58.70 ml  38.08 ml/m LA Biplane Vol: 63.8 ml 41.38 ml/m  AORTIC VALVE AV Area (Vmax):    1.05 cm AV Area (Vmean):   0.96 cm AV Area (VTI):     1.05 cm AV Vmax:           183.00 cm/s AV Vmean:          123.000 cm/s AV VTI:            0.292 m AV Peak Grad:      13.4 mmHg AV Mean Grad:      7.0 mmHg LVOT Vmax:         75.80 cm/s LVOT Vmean:        46.400 cm/s LVOT VTI:          0.120 m LVOT/AV VTI ratio: 0.41 AI PHT:            376 msec MITRAL VALVE               TRICUSPID VALVE MV Area (PHT): 2.97 cm    TR Peak grad:   59.3 mmHg MV Decel Time: 255 msec    TR Vmax:        385.00 cm/s MV E velocity: 99.00 cm/s MV A velocity: 59.20 cm/s  SHUNTS MV E/A ratio:  1.67        Systemic VTI:  0.12 m                            Systemic Diam: 1.80 cm Lennie Odor MD Electronically signed by Lennie Odor MD Signature Date/Time: 10/03/2022/3:10:04 PM    Final    US Abdomen Limited RUQ (LIVER/GB)  Result Date: 10/03/2022 CLINICAL DATA:  Abnormal liver enzymes EXAM: ULTRASOUND ABDOMEN LIMITED RIGHT UPPER QUADRANT COMPARISON:  09/26/2022 FINDINGS: Gallbladder: Gallstones: None Sludge: None Gallbladder Wall: Within normal limits Pericholecystic fluid: None Sonographic Murphy's Sign: Negative per technologist Common bile duct: Diameter: 3 mm Liver: Parenchymal echogenicity: Diffusely increased Contours: Mildly nodular Lesions: None Portal vein: Patent.  Hepatopetal flow Other: None. IMPRESSION: 1. Nodular hepatic contours, suspicious for cirrhosis. 2. Increased echogenicity of the hepatic parenchyma is a nonspecific indicator of hepatocellular dysfunction, most commonly  steatosis. Electronically Signed   By: Acquanetta Belling M.D.   On: 10/03/2022 08:52   CT SOFT TISSUE NECK WO CONTRAST  Result Date: 10/02/2022 CLINICAL DATA:  Neck swelling EXAM: CT NECK WITHOUT CONTRAST TECHNIQUE: Multidetector CT imaging of the neck was performed following the standard protocol without intravenous contrast. RADIATION DOSE REDUCTION: This exam was performed according to the departmental dose-optimization program which includes automated exposure control, adjustment of the mA and/or kV according to patient size and/or use of iterative reconstruction technique. COMPARISON:  None Available. FINDINGS: Pharynx and larynx: Evaluation is somewhat limited by motion artifact. No definite mass possible mild pharyngeal mucosal swelling. No significant airway narrowing. Salivary glands: No inflammation, mass, or stone. Thyroid: Normal. Lymph nodes: None enlarged or  abnormal density. Vascular: Evaluation is limited by the absence of intravenous contrast. Within this limitation, no vascular abnormality. Right IJ catheter. Limited intracranial: Negative. Visualized orbits: No acute finding. Status post bilateral lens replacements. Mastoids and visualized paranasal sinuses: Clear. Skeleton: No acute osseous abnormality. Degenerative changes in the cervical spine. Upper chest: Evaluation is limited by motion and beam hardening artifact. Within this limitation, no focal pulmonary opacity or pleural effusion. Left chest cardiac device Other: None. IMPRESSION: Evaluation is somewhat limited by motion artifact and the absence of intravenous contrast. Possible mild pharyngeal mucosal swelling, which could represent pharyngitis in the appropriate clinical setting. No significant airway narrowing. Electronically Signed   By: Wiliam Ke M.D.   On: 10/02/2022 19:07   CT Angio Abd/Pel w/ and/or w/o  Result Date: 10/02/2022 CLINICAL DATA:  Abdominal pain EXAM: CTA ABDOMEN AND PELVIS WITHOUT AND WITH CONTRAST TECHNIQUE:  Multidetector CT imaging of the abdomen and pelvis was performed using the standard protocol during bolus administration of intravenous contrast. Multiplanar reconstructed images and MIPs were obtained and reviewed to evaluate the vascular anatomy. RADIATION DOSE REDUCTION: This exam was performed according to the departmental dose-optimization program which includes automated exposure control, adjustment of the mA and/or kV according to patient size and/or use of iterative reconstruction technique. CONTRAST:  80mL OMNIPAQUE IOHEXOL 350 MG/ML SOLN COMPARISON:  07/05/2022 FINDINGS: VASCULAR Normal contour and caliber of the abdominal aorta. No evidence of aneurysm, dissection, or other acute aortic pathology. Standard branching pattern of the abdominal aorta, with solitary bilateral renal arteries. Moderate mixed calcific atherosclerosis. Review of the MIP images confirms the above findings. NON-VASCULAR Lower chest: Please see separately reported same day examination of the chest. Cardiomegaly. Hepatobiliary: No solid liver abnormality is seen. Coarse contour of the liver. Status post cholecystectomy. Pancreas: Unremarkable. No pancreatic ductal dilatation or surrounding inflammatory changes. Spleen: Normal in size without significant abnormality. Adrenals/Urinary Tract: Adrenal glands are unremarkable. Kidneys are normal, without renal calculi, solid lesion, or hydronephrosis. Bladder is unremarkable. Stomach/Bowel: Stomach is within normal limits. Appendix not clearly visualized and may be surgically absent. No evidence of bowel wall thickening, distention, or inflammatory changes. Vascular/Lymphatic: No significant vascular findings are present. No enlarged abdominal or pelvic lymph nodes. Reproductive: Calcified uterine fibroids. Other: Small left inguinal hernia containing fat and fluid (series 4, image 11). No ascites. Musculoskeletal: No acute or significant osseous findings. IMPRESSION: 1. Normal contour and  caliber of the abdominal aorta. No evidence of aneurysm, dissection, or other acute aortic pathology. Moderate mixed calcific atherosclerosis. 2. No acute CT findings of the abdomen or pelvis to explain abdominal pain. 3. Coarse contour of the liver, suggestive of cirrhosis. Correlate with biochemical findings. 4. Small left inguinal hernia containing fat and fluid. 5. Uterine fibroids. Aortic Atherosclerosis (ICD10-I70.0). Electronically Signed   By: Jearld Lesch M.D.   On: 10/02/2022 19:03   CT Angio Chest PE W and/or Wo Contrast  Result Date: 10/02/2022 CLINICAL DATA:  Pulmonary embolism (PE) suspected, high prob EXAM: CT ANGIOGRAPHY CHEST WITH CONTRAST TECHNIQUE: Multidetector CT imaging of the chest was performed using the standard protocol during bolus administration of intravenous contrast. Multiplanar CT image reconstructions and MIPs were obtained to evaluate the vascular anatomy. RADIATION DOSE REDUCTION: This exam was performed according to the departmental dose-optimization program which includes automated exposure control, adjustment of the mA and/or kV according to patient size and/or use of iterative reconstruction technique. CONTRAST:  75mL OMNIPAQUE IOHEXOL 350 MG/ML SOLN COMPARISON:  Chest radiograph 09/30/2022 FINDINGS: Cardiovascular: Breathing motion artifact  limitations. Allowing for this, no evidence of pulmonary embolus. Right-sided dialysis catheter tip in the distal SVC. Left-sided pacemaker in place. Multi chamber cardiomegaly. Contrast refluxes a prominently into the hepatic veins and IVC. No pericardial effusion. Atherosclerosis of the thoracic aorta, no aneurysm. Mediastinum/Nodes: Subcarinal lymph node measures 15 mm. Additional shotty mediastinal lymph nodes. There is no hilar adenopathy. Patulous esophagus. Lungs/Pleura: Motion artifact limitations. Mosaic attenuation of the pulmonary parenchyma. Minor subsegmental atelectasis in the lingula. No features of pulmonary edema. No  pleural fluid. Trachea and central airways are clear. Upper Abdomen: Stranding in the right upper quadrant medial to the liver, etiology uncertain. Musculoskeletal: There are no acute or suspicious osseous abnormalities. Review of the MIP images confirms the above findings. IMPRESSION: 1. No pulmonary embolus allowing for motion artifact limitations. 2. Multi chamber cardiomegaly with reflux of contrast into the hepatic veins and IVC consistent with elevated right heart pressures. 3. Mosaic attenuation of the pulmonary parenchyma, can be seen with small airways disease or small vessel disease. 4. Stranding in the right upper quadrant medial to the liver, only partially included in the field of view, etiology indeterminate. Aortic Atherosclerosis (ICD10-I70.0). Electronically Signed   By: Narda Rutherford M.D.   On: 10/02/2022 18:04     A/P: 77 year old woman with multiple severe comorbidities and multiorgan dysfunction including heart failure, end-stage renal disease, previously compensated cirrhosis but now with acute liver injury and decompensated cirrhosis, hypoxemic respiratory failure suspected to be due to viral pneumonia, pulmonary hypertension, A-fib, with abdominal pain and multiple significantly deranged labs as listed above.  Her lactate has down trended somewhat, which is encouraging given likely poor clearance in setting of renal and hepatic failure.  She does have a very concerning abdominal exam however.  Given negative CT less than 24 hours ago and very high risk of death associated with exploratory surgery in this extremely ill patient, we will proceed with a repeat CT scan to evaluate for any change or evidence of perforation, pneumatosis, or other abnormality that would merit surgical exploration.  Discussed with the patient as well as with her daughter at the bedside that given patient's other comorbidities, her risk from undergoing surgery is exceptionally high as briefly summarized below.   However, if she does have ischemic bowel or other surgical pathology that is not addressed, her risk of mortality is also exceptionally high.  Unfortunately she is in a very difficult position.  MELD 36 (52.6% 3 month mortality) NSQIP- 37% serious complication, 52% any complication, 17% cardiac event, 60% risk of death associated with exploratory laparotomy.  Patient Active Problem List   Diagnosis Date Noted   COPD (chronic obstructive pulmonary disease) (HCC) 10/02/2022   Acute hypoxemic respiratory failure (HCC) 10/02/2022   Acute upper GI bleeding 06/07/2022   Upper airway cough syndrome 05/17/2022   DOE (dyspnea on exertion) 03/27/2022   Prolonged QT interval 03/06/2022   Acute lower GI bleeding 10/25/2021   Transient hypotension 10/25/2021   Arthritis 09/06/2021   Chronic combined systolic (congestive) and diastolic (congestive) heart failure (HCC) 09/06/2021   History of blood transfusion 09/06/2021   Lower GI bleeding 09/06/2021   Carpal tunnel syndrome, bilateral 06/09/2021   Lesion of ulnar nerve 06/09/2021   Polyneuropathy associated with underlying disease (HCC) 06/09/2021   ESRD on dialysis (HCC) 03/22/2021   Esophageal varices without bleeding (HCC) 12/23/2020   Atrial fibrillation with RVR (HCC) 11/17/2020   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) 09/24/2020   Bleeding pseudoaneurysm of left brachiocephalic AV  fistula (HCC) 09/24/2020   Hepatic cirrhosis (HCC) 08/12/2020   Malnutrition of moderate degree 07/09/2020   Angiodysplasia of small intestine 06/10/2020   Acute blood loss anemia 05/27/2020   COVID-19 virus infection 05/27/2020   Anemia due to chronic blood loss 04/15/2020   Chronic anticoagulation    Persistent atrial fibrillation (HCC) 02/06/2020   Secondary hypercoagulable state (HCC) 02/06/2020   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 (HCV) (HCC)    Gout    Pain in right knee 05/13/2018   Anemia  01/18/2018   Pulmonary hypertension (HCC) on echocardiogram 01/14/2018   AVM (arteriovenous malformation) of small bowel, acquired 01/14/2018   Glaucoma 01/03/2018   Aortic atherosclerosis (HCC) 08/30/2017   Diverticulitis of left colon status post robotic low anterior to sigmoid resection 05/22/2018 07/31/2017   Hypertension associated with diabetes (HCC) 06/07/2017   MDD (major depressive disorder), recurrent, in partial remission (HCC) 06/07/2017   Asthma 05/22/2017   Chronic obstructive pulmonary disease (HCC)    Cough    AICD (automatic cardioverter/defibrillator) present 10/06/2015   Atypical atrial flutter (HCC) 09/28/2015   Type 2 diabetes, controlled, with renal manifestation (HCC) 11/24/2013   Nonischemic cardiomyopathy (HCC) 11/22/2010   DEPRESSION 12/17/2009   OSTEOPENIA 09/30/2008   FIBROIDS, UTERUS 03/05/2008   Chronic diastolic CHF (congestive heart failure) (HCC) 04/24/2007   Acquired hypothyroidism 01/28/2007   Biventricular ICD (implantable cardioverter-defibrillator) in place 01/08/2007       Phylliss Blakes, MD [facility]  See AMION to contact appropriate on-call provider   [facility]

## 2022-10-03 NOTE — Progress Notes (Addendum)
PCCM interval progress note:   Pt initially had some improved abdominal pain this morning, RUQ [name] did not show any thrombosis and was consistent with known cirrhosis.  Throughout the day her abdominal pain has continued to worsen and become more generalized.  Repeat lactic acid is pending.  Despite negative imaging I am concerned that she may be developing ischemic bowel with her elevated lactic acid and severe pain.  Have consulted surgery and will make NPO.  Daughter and patient updated at the bedside   .Darcella Gasman Kevork Joyce, PA-C [facility] See Amion for pager If no response to pager , please call [phone] until 7pm After 7:00 pm call Elink  [phone]

## 2022-10-03 NOTE — Consult Note (Signed)
Renal Service Consult Note [facility]  Nuha Valdivieso 10/03/2022 Maree Krabbe, MD Requesting Physician: Dr. Wynona Neat  Reason for Consult: ESRD pt w/  HPI: The patient is a 77 y.o. year-old w/ PMH as below who presented to ED for SOB, sent from dialysis (after dialysis). Pt has had a hx of pHTN, CHF, esrd on HD, cirrhosis+ hep C (s/p harvoni), atrial fib and esoph varices/ small bowel AVMs. In ED LA was 7.7 and he rec'd a 500 cc bolus of IVF's. SIRS was suspected. The pt required NRB mask and he was given duoneb and solumedrol IV. CTA chest was neg for PE. CT abd was unremarkable.  Pt was admitted. We are asked to see for dialysis.   Pt seen in ICU. Pt had OP HD yesterday. Has been feeling bad for  about 2 days. She was able to get thru HD yesterday but then came to ED. No recent HD issues. +abd pain, no chest pain.   ROS - denies CP, no joint pain, no HA, no blurry vision, no rash   Past Medical History  Past Medical History:  Diagnosis Date   Anemia    Angiectasia    Angiodysplasia of small intestine 06/10/2020   Ileum - seen on capsule endoscopy 05/2020 - ablated at colonoscopy   Aortic atherosclerosis (HCC) 08/30/2017   Arthritis    "qwhere" (01/03/2018)   Asthma    reports mild asthma since childhood - had COPD on dx list from prior PCP   Biventricular ICD (implantable cardioverter-defibrillator) in place 01/08/2007   Qualifier: Diagnosis of  By: Daphine Deutscher FNP, [place]     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) 09/24/2020   Chronic obstructive pulmonary disease (HCC)    Chronic systolic congestive heart failure (HCC)    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 (HCV) (HCC)    HEPATITIS C - s/p treatment with Harvoni, saw hepatology, Dawn Drazek   Diabetes [facility])    Diverticulitis of left colon status post robotic low anterior to sigmoid resection 05/22/2018 07/31/2017   Duodenal arteriovenous malformation    End  stage renal disease (HCC) 03/22/2021   Esophageal varices (HCC)    Glaucoma    Gout    History of blood transfusion ~ 11/2017   Hypertension associated with diabetes (HCC) 06/07/2017   Hypothyroidism    Internal hemorrhoids    LBBB (left bundle branch block)    Malnutrition of moderate degree 07/09/2020   MDD (major depressive disorder), recurrent, in partial remission (HCC) 06/07/2017   OBSTRUCTIVE SLEEP APNEA 11/14/2007   no CPAP   OSTEOPENIA 09/30/2008   Persistent atrial fibrillation (HCC) 02/06/2020   Polyneuropathy associated with underlying disease (HCC) 06/09/2021   Pulmonary hypertension (HCC) on echocardiogram 01/14/2018   Secondary esophageal varices without bleeding (HCC) 12/23/2020   Formatting of this note might be different from the original. Patient had trace esophageal varices during small bowel enteroscopy in October 2021, follow-up EGD in January 2022 with no varices.  She previously had GI bleeding thought to be from AVM versus GAVE.  She is no longer on carvedilol and will need surveillance endoscopy.  Patient was previously followed at [facility] and I have asked her    Secondary hypercoagulable state (HCC) 02/06/2020   Type 2 diabetes, controlled, with renal manifestation (HCC) 11/24/2013   Past Surgical History  Past Surgical History:  Procedure Laterality Date   A/V FISTULAGRAM N/A 03/28/2021   Procedure: A/V FISTULAGRAM;  Surgeon: Maeola Harman, MD;  Location: [facility] INVASIVE CV LAB;  Service: Cardiovascular;  Laterality: N/A;   APPENDECTOMY     AV FISTULA PLACEMENT Left 08/05/2020   Procedure: LEFT UPPER EXTREMITY ARTERIOVENOUS (AV) FISTULA CREATION;  Surgeon: Leonie Douglas, MD;  Location: [facility] OR;  Service: Vascular;  Laterality: Left;   BALLOON ENTEROSCOPY N/A 06/21/2022   Procedure: BALLOON ENTEROSCOPY;  Surgeon: Shellia Cleverly, DO;  Location: [facility] ENDOSCOPY;  Service: Gastroenterology;  Laterality: N/A;   BASCILIC VEIN TRANSPOSITION Left  09/16/2020   Procedure: LEFT SECOND STAGE BASCILIC VEIN TRANSPOSITION;  Surgeon: Leonie Douglas, MD;  Location: [facility] OR;  Service: Vascular;  Laterality: Left;  PERIPHERAL NERVE BLOCK   CARDIAC CATHETERIZATION     CARDIOVERSION N/A 12/14/2014   Procedure: CARDIOVERSION;  Surgeon: Lars Masson, MD;  Location: [facility] ENDOSCOPY;  Service: Cardiovascular;  Laterality: N/A;   CARDIOVERSION N/A 02/26/2017   Procedure: CARDIOVERSION;  Surgeon: Marinus Maw, MD;  Location: [facility] INVASIVE CV LAB;  Service: Cardiovascular;  Laterality: N/A;   CARDIOVERSION N/A 07/24/2017   Procedure: CARDIOVERSION;  Surgeon: Elease Hashimoto Deloris Ping, MD;  Location: [facility] ENDOSCOPY;  Service: Cardiovascular;  Laterality: N/A;   CARDIOVERSION N/A 02/12/2020   Procedure: CARDIOVERSION;  Surgeon: Laurey Morale, MD;  Location: [facility] ENDOSCOPY;  Service: Cardiovascular;  Laterality: N/A;   CARDIOVERSION N/A 09/15/2021   Procedure: CARDIOVERSION;  Surgeon: Laurey Morale, MD;  Location: [facility] ENDOSCOPY;  Service: Cardiovascular;  Laterality: N/A;   CARPAL TUNNEL RELEASE  07/21/2021   COLONOSCOPY N/A 11/08/2017   Procedure: COLONOSCOPY;  Surgeon: Rachael Fee, MD;  Location: [facility] ENDOSCOPY;  Service: Endoscopy;  Laterality: N/A;   COLONOSCOPY WITH PROPOFOL N/A 05/31/2020   Procedure: COLONOSCOPY WITH PROPOFOL;  Surgeon: Hilarie Fredrickson, MD;  Location: [facility] ENDOSCOPY;  Service: Endoscopy;  Laterality: N/A;   COLONOSCOPY WITH PROPOFOL N/A 10/26/2021   Procedure: COLONOSCOPY WITH PROPOFOL;  Surgeon: Imogene Burn, MD;  Location: [facility] ENDOSCOPY;  Service: Gastroenterology;  Laterality: N/A;   DILATION AND CURETTAGE OF UTERUS     ENTEROSCOPY N/A 03/02/2020   Procedure: ENTEROSCOPY;  Surgeon: Benancio Deeds, MD;  Location: [facility] ENDOSCOPY;  Service: Gastroenterology;  Laterality: N/A;   ENTEROSCOPY N/A 06/08/2022   Procedure: ENTEROSCOPY;  Surgeon: Beverley Fiedler, MD;  Location: [facility] ENDOSCOPY;  Service: Gastroenterology;  Laterality: N/A;   EP  IMPLANTABLE DEVICE N/A 10/06/2015   Procedure: BIV ICD Generator Changeout;  Surgeon: Duke Salvia, MD;  Location: [facility] INVASIVE CV LAB;  Service: Cardiovascular;  Laterality: N/A;   ESOPHAGOGASTRODUODENOSCOPY N/A 10/26/2017   Procedure: ESOPHAGOGASTRODUODENOSCOPY (EGD);  Surgeon: Meryl Dare, MD;  Location: [facility] ENDOSCOPY;  Service: Endoscopy;  Laterality: N/A;   ESOPHAGOGASTRODUODENOSCOPY (EGD) WITH PROPOFOL N/A 11/07/2017   Procedure: ESOPHAGOGASTRODUODENOSCOPY (EGD) WITH PROPOFOL;  Surgeon: Rachael Fee, MD;  Location: [facility] ENDOSCOPY;  Service: Endoscopy;  Laterality: N/A;   ESOPHAGOGASTRODUODENOSCOPY (EGD) WITH PROPOFOL N/A 05/29/2020   Procedure: ESOPHAGOGASTRODUODENOSCOPY (EGD) WITH PROPOFOL;  Surgeon: Sherrilyn Rist, MD;  Location: [facility] ENDOSCOPY;  Service: Gastroenterology;  Laterality: N/A;   ESOPHAGOGASTRODUODENOSCOPY (EGD) WITH PROPOFOL N/A 10/26/2021   Procedure: ESOPHAGOGASTRODUODENOSCOPY (EGD) WITH PROPOFOL;  Surgeon: Imogene Burn, MD;  Location: [facility] ENDOSCOPY;  Service: Gastroenterology;  Laterality: N/A;   ESOPHAGOGASTRODUODENOSCOPY (EGD) WITH PROPOFOL N/A 03/08/2022   Procedure: ESOPHAGOGASTRODUODENOSCOPY (EGD) WITH PROPOFOL;  Surgeon: Lynann Bologna, MD;  Location: [facility] ENDOSCOPY;  Service: Gastroenterology;  Laterality: N/A;   GIVENS CAPSULE STUDY N/A 05/19/2020   Procedure: GIVENS CAPSULE STUDY;  Surgeon: Iva Boop, MD;  Location: [facility]  ENDOSCOPY;  Service: Endoscopy;  Laterality: N/A;  .adm for obs since pacemaker, PA wil enter order and see pt   GIVENS CAPSULE STUDY N/A 05/29/2020   Procedure: GIVENS CAPSULE STUDY;  Surgeon: Sherrilyn Rist, MD;  Location: [facility] ENDOSCOPY;  Service: Gastroenterology;  Laterality: N/A;   GIVENS CAPSULE STUDY N/A 06/08/2022   Procedure: GIVENS CAPSULE STUDY;  Surgeon: Beverley Fiedler, MD;  Location: [facility] ENDOSCOPY;  Service: Gastroenterology;  Laterality: N/A;   HEMATOMA EVACUATION Left 09/24/2020   Procedure: EVACUATION HEMATOMA ARM;   Surgeon: Larina Earthly, MD;  Location: [facility] OR;  Service: Vascular;  Laterality: Left;   HEMOSTASIS CLIP PLACEMENT  06/21/2022   Procedure: HEMOSTASIS CLIP PLACEMENT;  Surgeon: Shellia Cleverly, DO;  Location: [facility] ENDOSCOPY;  Service: Gastroenterology;;   HOT HEMOSTASIS N/A 10/26/2017   Procedure: HOT HEMOSTASIS (ARGON PLASMA COAGULATION/BICAP);  Surgeon: Meryl Dare, MD;  Location: [facility] ENDOSCOPY;  Service: Endoscopy;  Laterality: N/A;   HOT HEMOSTASIS N/A 03/02/2020   Procedure: HOT HEMOSTASIS (ARGON PLASMA COAGULATION/BICAP);  Surgeon: Benancio Deeds, MD;  Location: [facility] ENDOSCOPY;  Service: Gastroenterology;  Laterality: N/A;   HOT HEMOSTASIS N/A 05/31/2020   Procedure: HOT HEMOSTASIS (ARGON PLASMA COAGULATION/BICAP);  Surgeon: Hilarie Fredrickson, MD;  Location: [facility] ENDOSCOPY;  Service: Endoscopy;  Laterality: N/A;   HOT HEMOSTASIS N/A 03/08/2022   Procedure: HOT HEMOSTASIS (ARGON PLASMA COAGULATION/BICAP);  Surgeon: Lynann Bologna, MD;  Location: [facility] ENDOSCOPY;  Service: Gastroenterology;  Laterality: N/A;   HOT HEMOSTASIS N/A 06/08/2022   Procedure: HOT HEMOSTASIS (ARGON PLASMA COAGULATION/BICAP);  Surgeon: Beverley Fiedler, MD;  Location: [facility] ENDOSCOPY;  Service: Gastroenterology;  Laterality: N/A;   HOT HEMOSTASIS N/A 06/21/2022   Procedure: HOT HEMOSTASIS (ARGON PLASMA COAGULATION/BICAP);  Surgeon: Shellia Cleverly, DO;  Location[facility] ENDOSCOPY;  Service: Gastroenterology;  Laterality: N/A;   ICD GENERATOR CHANGEOUT N/A 07/21/2022   Procedure: ICD GENERATOR CHANGEOUT;  Surgeon: Duke Salvia, MD;  Location: [facility] INVASIVE CV LAB;  Service: Cardiovascular;  Laterality: N/A;   INSERT / REPLACE / REMOVE PACEMAKER  ?2008   IR PERC TUN PERIT CATH WO PORT S&I /IMAG  07/05/2020   PERIPHERAL VASCULAR INTERVENTION Left 03/28/2021   Procedure: PERIPHERAL VASCULAR INTERVENTION;  Surgeon: Maeola Harman, MD;  Location: [facility] INVASIVE CV LAB;  Service: Cardiovascular;  Laterality: Left;   POLYPECTOMY   11/08/2017   Procedure: POLYPECTOMY;  Surgeon: Rachael Fee, MD;  Location: [facility] ENDOSCOPY;  Service: Endoscopy;;   PROCTOSCOPY N/A 05/22/2018   Procedure: RIGID PROCTOSCOPY;  Surgeon: Karie Soda, MD;  Location: [facility] ORS;  Service: General;  Laterality: N/A;   RIGHT HEART CATH N/A 02/13/2018   Procedure: RIGHT HEART CATH;  Surgeon: Laurey Morale, MD;  Location: [facility] INVASIVE CV LAB;  Service: Cardiovascular;  Laterality: N/A;   RIGHT HEART CATH N/A 06/23/2020   Procedure: RIGHT HEART CATH;  Surgeon: Laurey Morale, MD;  Location: [facility] INVASIVE CV LAB;  Service: Cardiovascular;  Laterality: N/A;   RIGHT/LEFT HEART CATH AND CORONARY ANGIOGRAPHY N/A 12/11/2019   Procedure: RIGHT/LEFT HEART CATH AND CORONARY ANGIOGRAPHY;  Surgeon: Laurey Morale, MD;  Location: [facility] INVASIVE CV LAB;  Service: Cardiovascular;  Laterality: N/A;   SUBMUCOSAL TATTOO INJECTION  03/02/2020   Procedure: SUBMUCOSAL TATTOO INJECTION;  Surgeon: Benancio Deeds, MD;  Location: [facility] ENDOSCOPY;  Service: Gastroenterology;;   SUBMUCOSAL TATTOO INJECTION  05/31/2020   Procedure: SUBMUCOSAL TATTOO INJECTION;  Surgeon: Hilarie Fredrickson, MD;  Location: [facility] ENDOSCOPY;  Service: Endoscopy;;   SUBMUCOSAL  TATTOO INJECTION  06/21/2022   Procedure: SUBMUCOSAL TATTOO INJECTION;  Surgeon: Shellia Cleverly, DO;  Location: [facility] ENDOSCOPY;  Service: Gastroenterology;;   TEE WITHOUT CARDIOVERSION N/A 08/02/2021   Procedure: TRANSESOPHAGEAL ECHOCARDIOGRAM (TEE);  Surgeon: Laurey Morale, MD;  Location: [facility] ENDOSCOPY;  Service: Cardiovascular;  Laterality: N/A;   TUBAL LIGATION     XI ROBOTIC ASSISTED LOWER ANTERIOR RESECTION N/A 05/22/2018   Procedure: XI ROBOTIC ASSISTED SIGMOID COLOECTOMY MOBILIZATION OF SPLENIC FLEXURE, FIREFLY ASSESSMENT OF PERFUSION;  Surgeon: Karie Soda, MD;  Location: [facility] ORS;  Service: General;  Laterality: N/A;  ERAS PATHWAY   Family History  Family History  Problem Relation Age of Onset   Asthma Father     Heart attack Father    Asthma Sister    Lung cancer Sister    Heart attack Mother    Stroke Brother    Colon cancer Neg Hx    Esophageal cancer Neg Hx    Pancreatic cancer Neg Hx    Stomach cancer Neg Hx    Liver disease Neg Hx    Social History  reports that she has never smoked. She has never used smokeless tobacco. She reports that she does not currently use alcohol. She reports that she does not currently use drugs after having used the following drugs: Marijuana. Allergies No Known Allergies Home medications Prior to Admission medications   Medication Sig Start Date End Date Taking? Authorizing Provider  albuterol (VENTOLIN HFA) 108 (90 Base) MCG/ACT inhaler Inhale 2 puffs into the lungs every 6 (six) hours as needed for wheezing or shortness of breath. 04/07/22   Hunsucker, Lesia Sago, MD  amiodarone (PACERONE) 200 MG tablet Take 1 tablet by mouth once daily 08/30/22   Saguier, Ramon Dredge, PA-C  ammonium lactate (LAC-HYDRIN) 12 % lotion Apply daily on forearms. 09/05/22   Sharlene Dory, DO  amoxicillin-clavulanate (AUGMENTIN) 875-125 MG tablet Take 1 tablet by mouth 2 (two) times daily. 09/28/22   Clayborne [name], NP  apixaban (ELIQUIS) 2.5 MG TABS tablet Take 1 tablet (2.5 mg total) by mouth 2 (two) times daily. 03/11/22   Pokhrel, Rebekah Chesterfield, MD  cyclobenzaprine (FLEXERIL) 5 MG tablet Take 5 mg by mouth at bedtime as needed for muscle spasms. 12/07/21   [provider]  gabapentin (NEURONTIN) 300 MG capsule Take 1 capsule (300 mg total) by mouth daily. Fill one rx. Computer may have sent over 2 Patient taking differently: Take 300 mg by mouth daily. 01/10/22   Saguier, Ramon Dredge, PA-C  HYDROcodone bit-homatropine (HYCODAN) 5-1.5 MG/5ML syrup Take 5 mLs by mouth every 8 (eight) hours as needed for up to 5 days for cough. 09/28/22 10/03/22  Clayborne [name], NP  ipratropium (ATROVENT HFA) 17 MCG/ACT inhaler Inhale 2 puffs into the lungs every 6 (six) hours as needed for wheezing. 03/24/22    Saguier, Ramon Dredge, PA-C  levothyroxine (SYNTHROID) 125 MCG tablet Take 1 tablet (125 mcg total) by mouth daily before breakfast. 01/16/22   Saguier, Ramon Dredge, PA-C  lidocaine-prilocaine (EMLA) cream Apply 1 Application topically as needed.    [provider]  methylPREDNISolone (MEDROL) 4 MG tablet Standard 6 day taper 09/28/22   Clayborne [name], NP  nitroGLYCERIN (NITROSTAT) 0.4 MG SL tablet Place 1 tablet (0.4 mg total) under the tongue every 5 (five) minutes as needed for chest pain. Patient not taking: Reported on 09/28/2022 10/27/21   Leroy Sea, MD  triamcinolone cream (KENALOG) 0.1 % Apply to shins twice dialy for 7 days. 09/05/22   Wendling,  Jilda Roche, DO  Vitamin D, Ergocalciferol, (DRISDOL) 1.25 MG (50000 UNIT) CAPS capsule Take 1 capsule (50,000 Units total) by mouth every 7 (seven) days. 09/06/22   Sharlene Dory, DO     Vitals:   10/03/22 1100 10/03/22 1117 10/03/22 1200 10/03/22 1325  BP: (!) 136/120  123/71   Pulse: 94  87   Resp: (!) 24  17   Temp:  97.6 F (36.4 C)    TempSrc:  Axillary    SpO2: 92%  98% 93%  Weight:       Exam Gen alert, no distress No rash, cyanosis or gangrene Sclera anicteric, throat clear  No jvd or bruits Chest slightly basilar crackles and slight exp wheezing off and on RRR no RG Abd soft ntnd no mass or ascites +bs GU defer MS no joint effusions or deformity Ext no LE or UE edema, no wounds or ulcers Neuro is alert, Ox 3 , nf      Home meds include - albuterol, amiodarone, eliquis, gabapentin, hydrocodone prn, synthroid 125 mcg, sl ntg prn, prn's/ vits/ supps    OP HD: SW MWF 3.5h  400/600   58.5kjg  2/2 bath  AVF  Heparin none - last HD 5/27, post wt 57.9kg - sensipar 30mg  po tiw - hectorol 4 mcg IV tiw   CTA chest  5/17 - IMPRESSION: 1. No pulmonary embolus allowing for motion artifact limitations. 2. Multi chamber cardiomegaly with reflux of contrast into the hepatic veins and IVC consistent with elevated right  heart pressures. 3. Mosaic attenuation of the pulmonary parenchyma, can be seen with small airways disease or small vessel disease.    VS 130s/ 70s, HR 90s, RR 26 >> 17, afebrile    [place] at 12 L/ min    Resp panel by CPR --> + for rhinovirus/ enterovirus  Assessment/ Plan: Hypoxemic resp failure - likely due to viral pna per pmd. Nasal swab returned + for rhinovirus/ enterovirus. Having stomach pains too. Per pmd Abd pain -  per pmd ESRD - on HD MWF. Last HD was outpt yesterday. Next HD tomorrow.   HTN/ volume - bp's wnl, not on pressors. Not on BP lowering meds. 1kg up by wts. Small UF w/ next HD.  Anemia esrd - Hb 11- 12 here, no esa needs MBD ckd - CCa in range, add on phos. Cont sensipar and IV vdra.  H/o pHTN H/o atrial fib    Rob Arlean Hopping  MD [facility] 10/03/2022, 2:50 PM  Recent Labs  Lab 09/30/22 1515 10/02/22 1616 10/02/22 1740  HGB 11.9* 12.5 15.0  ALBUMIN 4.2 4.3  --   CALCIUM 7.9* 7.7*  --   CREATININE 5.95* 3.69*  --   K 4.2 4.1 4.4   Inpatient medications:  Chlorhexidine Gluconate Cloth  6 each Topical Daily   heparin injection (subcutaneous)  5,000 Units Subcutaneous Q8H   insulin aspart  0-6 Units Subcutaneous Q4H   ipratropium-albuterol  3 mL Nebulization Q6H   levothyroxine  125 mcg Oral Q0600   methylPREDNISolone (SOLU-MEDROL) injection  80 mg Intravenous Q24H    benzonatate, docusate sodium, HYDROcodone bit-homatropine, HYDROmorphone (DILAUDID) injection, mouth rinse, polyethylene glycol

## 2022-10-03 NOTE — Progress Notes (Signed)
eLink Physician-Brief Progress Note Patient Name: Viktorya Leifheit DOB: 26-Jan-1946 MRN: [id number]   Date of Service  10/03/2022  HPI/Events of Note  Pt in pain due to coughing. Pt is getting hycodan PRN.  Respiratory viral panel positive for rhinovirus.  eICU Interventions  Add antitussive tessalon perles.      Intervention Category Intermediate Interventions: Other:  Larinda Buttery 10/03/2022, 4:19 AM

## 2022-10-03 NOTE — Progress Notes (Addendum)
NAME:  Morgan Clay, MRN:  [id number], DOB:  31-Dec-1945, LOS: 1 ADMISSION DATE:  10/02/2022, CONSULTATION DATE:  10/02/22 REFERRING MD:  Ernie Avena, MD CHIEF COMPLAINT:  Respiratory Failure  History of Present Illness:  Morgan Clay is a 77 year old woman, never smoker with history of heart failure, pulmonary hypertension, asthma, ESRD on HD, cirrhosis secondary to hepatitis C s/p Harvoni treatment, atrial fibrillation and esophageal varices/small bowel AVMs who presented to [facility] with shortness of breath after dialysis, she was tachypneic with an SpO2 on arrival in the ER.   She was noted to have Lactic acid of 7.7 and was given a bolus with repeat lactic acid 7.6. Due to increase work of breathing she was placed on non-rebreather mask. She was given duoneb and solumedrol 125mg  IV. CTA Chest was negative for PE, there was motion artifact but mosaic attenuation of the pulmonary parenchyma. CT Neck showed possible mild pharyngeal mucosal swelling, but again difficult due to motion artifact. Neck CT was done for neck swelling. CT abdomen done for abdominal pain with no acute findings.   PCCM consulted as patient remained tachypneic with increased work of breathing after transfer from non-rebreather to HFNC.   Patient accepted in transfer from [facility] to [facility] ICU.  Pertinent  Medical History   Asthma Pulmonary Hypertension OSA Chronic Systolic Heart Failure Atrial Fibrillation DMII ESRD on HD Cirrhosis Hepatitis C s/p Harvoni Esophageal Varices Duodenal AVM  Significant Hospital Events: Including procedures, antibiotic start and stop dates in addition to other pertinent events   5/27 presented to [facility], transferred to [facility] 5/28 remains on 12L, stable, viral panel + for rhinovirus/Enterovirus   Interim History / Subjective:   Pt is awake and alert, remains stable and c/o diffuse abdominal pain, lactic >8 this  AM  Objective   Blood pressure 139/65, pulse 95, temperature 98.2 F (36.8 C), temperature source Oral, resp. rate (!) 21, weight 59.5 kg, SpO2 100 %.        Intake/Output Summary (Last 24 hours) at 10/03/2022 4098 Last data filed at 10/03/2022 0100 Gross per 24 hour  Intake 838.14 ml  Output --  Net 838.14 ml    Filed Weights   10/03/22 0500  Weight: 59.5 kg     General:  chronically and acutely ill-appearing F, uncomfortable appearing HEENT: MM pink/moist Neuro: alert and oriented, moving all extremities  CV: s1s2 rrr, no m/r/g PULM:  mildly diminished in the bilateral bases on 12L O2 without rhonchi, wheezing or accessory muscle yse GI: soft, severely and diffusely tender, no rebound, intermittently guarding Extremities: warm/dry, no edema  Skin: no rashes or lesions    RHC 06/23/20 R[facility] Procedural Findings: Hemodynamics (mmHg) RA mean 21 RV 49/20 PA 59/24, mean 39 PCWP mean 26  Oxygen saturations: PA 64% AO 98%  Cardiac Output (Fick) 5.07  Cardiac Index (Fick) 3.23 PVR 2.6 WU  Cardiac Output (Thermo) 4.72  Cardiac Index (Thermo) 3.01  PVR 2.75 WU  PAPI 1.7 Resolved Hospital Problem list     Assessment & Plan:  Acute Hypoxemic Respiratory Failure likely secondary to viral pneumonia and asthma exacerbation Pulmonary Hypertension History of OSA Remains on 12L - goal SpO2 92% or higher for HFNC - CPAP at night or when sleeping during day - volume management with iHD - Hycodan cough syrup for cough - viral panel + for rhinovirus and enterovirus  -not currently wheezing, consider steroids if worsening -received broad spectrum abx in the ED,  hold for now, check procal  Non-Ischemic Cardiomyopathy Right Heart Failure Atrial Fibrillation Aortic Insufficiency Elevated Troponin - off coreg and bidil per Heart Failure team from earlier admission due to hypotension - HD for volume management - EKG not concerning for ischemic changes - hold amiodarone  due to elevation in liver enzymes at this time  - hold eliquis 2.5mg  for now, check coags in the setting of elevated LFT's and consider heparin gtt  ESRD on HD Mild Hyponatremia Lactic Acidosis - will consult nephrology for iHD   Anemia of Chronic Disease - transfuse for hemoglobin < 7g/dL  Abdominal Pain Cirrhosis Elevated Liver Enzymes Lactic acidosis - CT without acute pathology - Liver enzymes elevated in setting of hepatic congestion due to Right heart failure and hypoxemia - check RUQ [name] without hepatic vein thrombosis, shows cirrhosis and likely steatosis  - check amylase elevated and lipase WNL -check repeat lactic acid, if continues to rise consider early bowel ischemia and surgery consult   Best Practice (right click and "Reselect all SmartList Selections" daily)   Diet/type: NPO w/ oral meds DVT prophylaxis: SCD GI prophylaxis: N/A Lines: N/A Foley:  N/A Code Status:  full code Last date of multidisciplinary goals of care discussion [discussed with patient at bedside 5/27 - wishes to be full code]  Labs   CBC: Recent Labs  Lab 09/28/22 1834 09/30/22 1515 10/02/22 1616 10/02/22 1740  WBC 5.9 5.0 14.2*  --   NEUTROABS  --  3.7 12.2*  --   HGB 11.9* 11.9* 12.5 15.0  HCT 38.0 37.6 38.6 44.0  MCV 90.9 90.0 87.7  --   PLT 145* 140* 147*  --      Basic Metabolic Panel: Recent Labs  Lab 09/28/22 2105 09/30/22 1515 10/02/22 1616 10/02/22 1740  NA 134* 130* 134* 131*  K 3.5 4.2 4.1 4.4  CL 94* 90* 90*  --   CO2 27 25 25   --   GLUCOSE 123* 148* 87  --   BUN 23 18 15   --   CREATININE 6.73* 5.95* 3.69*  --   CALCIUM 7.7* 7.9* 7.7*  --   MG  --   --  2.2  --     GFR: Estimated Creatinine Clearance: 10.5 mL/min (A) (by C-G formula based on SCr of 3.69 mg/dL (H)). Recent Labs  Lab 09/28/22 1834 09/30/22 1515 10/02/22 1616 10/02/22 1626 10/02/22 1813 10/03/22 0017  WBC 5.9 5.0 14.2*  --   --   --   LATICACIDVEN  --   --   --  7.7* 7.6* 8.9*      Liver Function Tests: Recent Labs  Lab 09/30/22 1515 10/02/22 1616 10/03/22 0017  AST 31 1,308*  --   ALT 13 681*  --   ALKPHOS 85 102 96  BILITOT 0.8 3.1*  --   PROT 9.1* 9.3*  --   ALBUMIN 4.2 4.3  --     Recent Labs  Lab 10/03/22 0017  LIPASE 34  AMYLASE 1,215*   No results for input(s): "AMMONIA" in the last 168 hours.  ABG    Component Value Date/Time   PHART 7.364 08/12/2017 1353   PCO2ART 43.1 08/12/2017 1353   PO2ART 79.3 (L) 08/12/2017 1353   HCO3 24.2 10/02/2022 1740   TCO2 26 10/02/2022 1740   ACIDBASEDEF 1.0 10/02/2022 1740   O2SAT 38 10/02/2022 1740     Coagulation Profile: No results for input(s): "INR", "PROTIME" in the last 168 hours.  Cardiac Enzymes: No results for  input(s): "CKTOTAL", "CKMB", "CKMBINDEX", "TROPONINI" in the last 168 hours.  HbA1C: Hgb A1c MFr Bld  Date/Time Value Ref Range Status  09/05/2022 02:44 PM 5.5 4.6 - 6.5 % Final    Comment:    Glycemic Control Guidelines for People with Diabetes:Non Diabetic:  <6%Goal of Therapy: <7%Additional Action Suggested:  >8%   10/06/2021 11:59 AM 4.9 4.6 - 6.5 % Final    Comment:    Glycemic Control Guidelines for People with Diabetes:Non Diabetic:  <6%Goal of Therapy: <7%Additional Action Suggested:  >8%     CBG: Recent Labs  Lab 10/02/22 2242 10/03/22 0313 10/03/22 0734  GLUCAP 80 81 99    Review of Systems:   A 10 point review of systems was performed and is negative unless stated in HPI  Past Medical History:  She,  has a past medical history of Anemia, Angiectasia, Angiodysplasia of small intestine (06/10/2020), Aortic atherosclerosis (HCC) (08/30/2017), Arthritis, Asthma, Biventricular ICD (implantable cardioverter-defibrillator) in place (01/08/2007), 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) (09/24/2020), Chronic obstructive pulmonary disease (HCC), Chronic systolic congestive heart failure (HCC), Compensated cirrhosis related to  hepatitis C virus (HCV) (HCC), Diabetes (HCC), Diverticulitis of left colon status post robotic low anterior to sigmoid resection 05/22/2018 (07/31/2017), Duodenal arteriovenous malformation, End stage renal disease (HCC) (03/22/2021), Esophageal varices (HCC), Glaucoma, Gout, History of blood transfusion (~ 11/2017), Hypertension associated with diabetes (HCC) (06/07/2017), Hypothyroidism, Internal hemorrhoids, LBBB (left bundle branch block), Malnutrition of moderate degree (07/09/2020), MDD (major depressive disorder), recurrent, in partial remission (HCC) (06/07/2017), OBSTRUCTIVE SLEEP APNEA (11/14/2007), OSTEOPENIA (09/30/2008), Persistent atrial fibrillation (HCC) (02/06/2020), Polyneuropathy associated with underlying disease (HCC) (06/09/2021), Pulmonary hypertension (HCC) on echocardiogram (01/14/2018), Secondary esophageal varices without bleeding (HCC) (12/23/2020), Secondary hypercoagulable state (HCC) (02/06/2020), and Type 2 diabetes, controlled, with renal manifestation (HCC) (11/24/2013).   Surgical History:   Past Surgical History:  Procedure Laterality Date   A/V FISTULAGRAM N/A 03/28/2021   Procedure: A/V FISTULAGRAM;  Surgeon: Maeola Harman, MD;  Location: [facility] INVASIVE CV LAB;  Service: Cardiovascular;  Laterality: N/A;   APPENDECTOMY     AV FISTULA PLACEMENT Left 08/05/2020   Procedure: LEFT UPPER EXTREMITY ARTERIOVENOUS (AV) FISTULA CREATION;  Surgeon: Leonie Douglas, MD;  Location: [facility] OR;  Service: Vascular;  Laterality: Left;   BALLOON ENTEROSCOPY N/A 06/21/2022   Procedure: BALLOON ENTEROSCOPY;  Surgeon: Shellia Cleverly, DO;  Location: [facility] ENDOSCOPY;  Service: Gastroenterology;  Laterality: N/A;   BASCILIC VEIN TRANSPOSITION Left 09/16/2020   Procedure: LEFT SECOND STAGE BASCILIC VEIN TRANSPOSITION;  Surgeon: Leonie Douglas, MD;  Location: [facility] OR;  Service: Vascular;  Laterality: Left;  PERIPHERAL NERVE BLOCK   CARDIAC CATHETERIZATION     CARDIOVERSION N/A  12/14/2014   Procedure: CARDIOVERSION;  Surgeon: Lars Masson, MD;  Location: [facility] ENDOSCOPY;  Service: Cardiovascular;  Laterality: N/A;   CARDIOVERSION N/A 02/26/2017   Procedure: CARDIOVERSION;  Surgeon: Marinus Maw, MD;  Location: [facility] INVASIVE CV LAB;  Service: Cardiovascular;  Laterality: N/A;   CARDIOVERSION N/A 07/24/2017   Procedure: CARDIOVERSION;  Surgeon: Elease Hashimoto Deloris Ping, MD;  Location: [facility] ENDOSCOPY;  Service: Cardiovascular;  Laterality: N/A;   CARDIOVERSION N/A 02/12/2020   Procedure: CARDIOVERSION;  Surgeon: Laurey Morale, MD;  Location: [facility] ENDOSCOPY;  Service: Cardiovascular;  Laterality: N/A;   CARDIOVERSION N/A 09/15/2021   Procedure: CARDIOVERSION;  Surgeon: Laurey Morale, MD;  Location: [facility] ENDOSCOPY;  Service: Cardiovascular;  Laterality: N/A;   CARPAL TUNNEL RELEASE  07/21/2021   COLONOSCOPY N/A 11/08/2017   Procedure:  COLONOSCOPY;  Surgeon: Rachael Fee, MD;  Location: [facility] ENDOSCOPY;  Service: Endoscopy;  Laterality: N/A;   COLONOSCOPY WITH PROPOFOL N/A 05/31/2020   Procedure: COLONOSCOPY WITH PROPOFOL;  Surgeon: Hilarie Fredrickson, MD;  Location: [facility] ENDOSCOPY;  Service: Endoscopy;  Laterality: N/A;   COLONOSCOPY WITH PROPOFOL N/A 10/26/2021   Procedure: COLONOSCOPY WITH PROPOFOL;  Surgeon: Imogene Burn, MD;  Location: [facility] ENDOSCOPY;  Service: Gastroenterology;  Laterality: N/A;   DILATION AND CURETTAGE OF UTERUS     ENTEROSCOPY N/A 03/02/2020   Procedure: ENTEROSCOPY;  Surgeon: Benancio Deeds, MD;  Location: [facility] ENDOSCOPY;  Service: Gastroenterology;  Laterality: N/A;   ENTEROSCOPY N/A 06/08/2022   Procedure: ENTEROSCOPY;  Surgeon: Beverley Fiedler, MD;  Location: [facility] ENDOSCOPY;  Service: Gastroenterology;  Laterality: N/A;   EP IMPLANTABLE DEVICE N/A 10/06/2015   Procedure: BIV ICD Generator Changeout;  Surgeon: Duke Salvia, MD;  Location: [facility] INVASIVE CV LAB;  Service: Cardiovascular;  Laterality: N/A;   ESOPHAGOGASTRODUODENOSCOPY N/A 10/26/2017    Procedure: ESOPHAGOGASTRODUODENOSCOPY (EGD);  Surgeon: Meryl Dare, MD;  Location: [facility] ENDOSCOPY;  Service: Endoscopy;  Laterality: N/A;   ESOPHAGOGASTRODUODENOSCOPY (EGD) WITH PROPOFOL N/A 11/07/2017   Procedure: ESOPHAGOGASTRODUODENOSCOPY (EGD) WITH PROPOFOL;  Surgeon: Rachael Fee, MD;  Location: [facility] ENDOSCOPY;  Service: Endoscopy;  Laterality: N/A;   ESOPHAGOGASTRODUODENOSCOPY (EGD) WITH PROPOFOL N/A 05/29/2020   Procedure: ESOPHAGOGASTRODUODENOSCOPY (EGD) WITH PROPOFOL;  Surgeon: Sherrilyn Rist, MD;  Location: [facility] ENDOSCOPY;  Service: Gastroenterology;  Laterality: N/A;   ESOPHAGOGASTRODUODENOSCOPY (EGD) WITH PROPOFOL N/A 10/26/2021   Procedure: ESOPHAGOGASTRODUODENOSCOPY (EGD) WITH PROPOFOL;  Surgeon: Imogene Burn, MD;  Location: [facility] ENDOSCOPY;  Service: Gastroenterology;  Laterality: N/A;   ESOPHAGOGASTRODUODENOSCOPY (EGD) WITH PROPOFOL N/A 03/08/2022   Procedure: ESOPHAGOGASTRODUODENOSCOPY (EGD) WITH PROPOFOL;  Surgeon: Lynann Bologna, MD;  Location: [facility] ENDOSCOPY;  Service: Gastroenterology;  Laterality: N/A;   GIVENS CAPSULE STUDY N/A 05/19/2020   Procedure: GIVENS CAPSULE STUDY;  Surgeon: Iva Boop, MD;  Location: [facility] ENDOSCOPY;  Service: Endoscopy;  Laterality: N/A;  .adm for obs since pacemaker, PA wil enter order and see pt   GIVENS CAPSULE STUDY N/A 05/29/2020   Procedure: GIVENS CAPSULE STUDY;  Surgeon: Sherrilyn Rist, MD;  Location: [facility] ENDOSCOPY;  Service: Gastroenterology;  Laterality: N/A;   GIVENS CAPSULE STUDY N/A 06/08/2022   Procedure: GIVENS CAPSULE STUDY;  Surgeon: Beverley Fiedler, MD;  Location: [facility] ENDOSCOPY;  Service: Gastroenterology;  Laterality: N/A;   HEMATOMA EVACUATION Left 09/24/2020   Procedure: EVACUATION HEMATOMA ARM;  Surgeon: Larina Earthly, MD;  Location: [facility] OR;  Service: Vascular;  Laterality: Left;   HEMOSTASIS CLIP PLACEMENT  06/21/2022   Procedure: HEMOSTASIS CLIP PLACEMENT;  Surgeon: Shellia Cleverly, DO;  Location: [facility] ENDOSCOPY;  Service:  Gastroenterology;;   HOT HEMOSTASIS N/A 10/26/2017   Procedure: HOT HEMOSTASIS (ARGON PLASMA COAGULATION/BICAP);  Surgeon: Meryl Dare, MD;  Location: [facility] ENDOSCOPY;  Service: Endoscopy;  Laterality: N/A;   HOT HEMOSTASIS N/A 03/02/2020   Procedure: HOT HEMOSTASIS (ARGON PLASMA COAGULATION/BICAP);  Surgeon: Benancio Deeds, MD;  Location: [facility] ENDOSCOPY;  Service: Gastroenterology;  Laterality: N/A;   HOT HEMOSTASIS N/A 05/31/2020   Procedure: HOT HEMOSTASIS (ARGON PLASMA COAGULATION/BICAP);  Surgeon: Hilarie Fredrickson, MD;  Location: [facility] ENDOSCOPY;  Service: Endoscopy;  Laterality: N/A;   HOT HEMOSTASIS N/A 03/08/2022   Procedure: HOT HEMOSTASIS (ARGON PLASMA COAGULATION/BICAP);  Surgeon: Lynann Bologna, MD;  Location: [facility] ENDOSCOPY;  Service: Gastroenterology;  Laterality: N/A;   HOT HEMOSTASIS N/A 06/08/2022  Procedure: HOT HEMOSTASIS (ARGON PLASMA COAGULATION/BICAP);  Surgeon: Beverley Fiedler, MD;  Location: [facility] ENDOSCOPY;  Service: Gastroenterology;  Laterality: N/A;   HOT HEMOSTASIS N/A 06/21/2022   Procedure: HOT HEMOSTASIS (ARGON PLASMA COAGULATION/BICAP);  Surgeon: Shellia Cleverly, DO;  Location: [facility] ENDOSCOPY;  Service: Gastroenterology;  Laterality: N/A;   ICD GENERATOR CHANGEOUT N/A 07/21/2022   Procedure: ICD GENERATOR CHANGEOUT;  Surgeon: Duke Salvia, MD;  Location: [facility] INVASIVE CV LAB;  Service: Cardiovascular;  Laterality: N/A;   INSERT / REPLACE / REMOVE PACEMAKER  ?2008   IR PERC TUN PERIT CATH WO PORT S&I /IMAG  07/05/2020   PERIPHERAL VASCULAR INTERVENTION Left 03/28/2021   Procedure: PERIPHERAL VASCULAR INTERVENTION;  Surgeon: Maeola Harman, MD;  Location: [facility] INVASIVE CV LAB;  Service: Cardiovascular;  Laterality: Left;   POLYPECTOMY  11/08/2017   Procedure: POLYPECTOMY;  Surgeon: Rachael Fee, MD;  Location: [facility] ENDOSCOPY;  Service: Endoscopy;;   PROCTOSCOPY N/A 05/22/2018   Procedure: RIGID PROCTOSCOPY;  Surgeon: Karie Soda, MD;  Location: [facility] ORS;   Service: General;  Laterality: N/A;   RIGHT HEART CATH N/A 02/13/2018   Procedure: RIGHT HEART CATH;  Surgeon: Laurey Morale, MD;  Location: [facility] INVASIVE CV LAB;  Service: Cardiovascular;  Laterality: N/A;   RIGHT HEART CATH N/A 06/23/2020   Procedure: RIGHT HEART CATH;  Surgeon: Laurey Morale, MD;  Location: [facility] INVASIVE CV LAB;  Service: Cardiovascular;  Laterality: N/A;   RIGHT/LEFT HEART CATH AND CORONARY ANGIOGRAPHY N/A 12/11/2019   Procedure: RIGHT/LEFT HEART CATH AND CORONARY ANGIOGRAPHY;  Surgeon: Laurey Morale, MD;  Location: [facility] INVASIVE CV LAB;  Service: Cardiovascular;  Laterality: N/A;   SUBMUCOSAL TATTOO INJECTION  03/02/2020   Procedure: SUBMUCOSAL TATTOO INJECTION;  Surgeon: Benancio Deeds, MD;  Location: [facility] ENDOSCOPY;  Service: Gastroenterology;;   SUBMUCOSAL TATTOO INJECTION  05/31/2020   Procedure: SUBMUCOSAL TATTOO INJECTION;  Surgeon: Hilarie Fredrickson, MD;  Location: [facility] ENDOSCOPY;  Service: Endoscopy;;   SUBMUCOSAL TATTOO INJECTION  06/21/2022   Procedure: SUBMUCOSAL TATTOO INJECTION;  Surgeon: Shellia Cleverly, DO;  Location: [facility] ENDOSCOPY;  Service: Gastroenterology;;   TEE WITHOUT CARDIOVERSION N/A 08/02/2021   Procedure: TRANSESOPHAGEAL ECHOCARDIOGRAM (TEE);  Surgeon: Laurey Morale, MD;  Location: [facility] ENDOSCOPY;  Service: Cardiovascular;  Laterality: N/A;   TUBAL LIGATION     XI ROBOTIC ASSISTED LOWER ANTERIOR RESECTION N/A 05/22/2018   Procedure: XI ROBOTIC ASSISTED SIGMOID COLOECTOMY MOBILIZATION OF SPLENIC FLEXURE, FIREFLY ASSESSMENT OF PERFUSION;  Surgeon: Karie Soda, MD;  Location: [facility] ORS;  Service: General;  Laterality: N/A;  ERAS PATHWAY     Social History:   reports that she has never smoked. She has never used smokeless tobacco. She reports that she does not currently use alcohol. She reports that she does not currently use drugs after having used the following drugs: Marijuana.   Family History:  Her family history includes Asthma in her father  and sister; Heart attack in her father and mother; Lung cancer in her sister; Stroke in her brother. There is no history of Colon cancer, Esophageal cancer, Pancreatic cancer, Stomach cancer, or Liver disease.   Allergies No Known Allergies   Home Medications  Prior to Admission medications   Medication Sig Start Date End Date Taking? Authorizing Provider  albuterol (VENTOLIN HFA) 108 (90 Base) MCG/ACT inhaler Inhale 2 puffs into the lungs every 6 (six) hours as needed for wheezing or shortness of breath. 04/07/22   Hunsucker, Lesia Sago, MD  amiodarone (PACERONE) 200 MG tablet  Take 1 tablet by mouth once daily 08/30/22   Saguier, Ramon Dredge, PA-C  ammonium lactate (LAC-HYDRIN) 12 % lotion Apply daily on forearms. 09/05/22   Sharlene Dory, DO  amoxicillin-clavulanate (AUGMENTIN) 875-125 MG tablet Take 1 tablet by mouth 2 (two) times daily. 09/28/22   Clayborne [name], NP  apixaban (ELIQUIS) 2.5 MG TABS tablet Take 1 tablet (2.5 mg total) by mouth 2 (two) times daily. 03/11/22   Pokhrel, Rebekah Chesterfield, MD  cyclobenzaprine (FLEXERIL) 5 MG tablet Take 5 mg by mouth at bedtime as needed for muscle spasms. 12/07/21   [provider]  gabapentin (NEURONTIN) 300 MG capsule Take 1 capsule (300 mg total) by mouth daily. Fill one rx. Computer may have sent over 2 Patient taking differently: Take 300 mg by mouth daily. 01/10/22   Saguier, Ramon Dredge, PA-C  HYDROcodone bit-homatropine (HYCODAN) 5-1.5 MG/5ML syrup Take 5 mLs by mouth every 8 (eight) hours as needed for up to 5 days for cough. 09/28/22 10/03/22  Clayborne [name], NP  ipratropium (ATROVENT HFA) 17 MCG/ACT inhaler Inhale 2 puffs into the lungs every 6 (six) hours as needed for wheezing. 03/24/22   Saguier, Ramon Dredge, PA-C  levothyroxine (SYNTHROID) 125 MCG tablet Take 1 tablet (125 mcg total) by mouth daily before breakfast. 01/16/22   Saguier, Ramon Dredge, PA-C  lidocaine-prilocaine (EMLA) cream Apply 1 Application topically as needed.    [provider]   methylPREDNISolone (MEDROL) 4 MG tablet Standard 6 day taper 09/28/22   Clayborne [name], NP  nitroGLYCERIN (NITROSTAT) 0.4 MG SL tablet Place 1 tablet (0.4 mg total) under the tongue every 5 (five) minutes as needed for chest pain. Patient not taking: Reported on 09/28/2022 10/27/21   Leroy Sea, MD  triamcinolone cream (KENALOG) 0.1 % Apply to shins twice dialy for 7 days. 09/05/22   Sharlene Dory, DO  Vitamin D, Ergocalciferol, (DRISDOL) 1.25 MG (50000 UNIT) CAPS capsule Take 1 capsule (50,000 Units total) by mouth every 7 (seven) days. 09/06/22   Sharlene Dory, DO     Critical care time: 35 minutes    CRITICAL CARE Performed by: Darcella Gasman Colden Samaras   Total critical care time: 35 minutes  Critical care time was exclusive of separately billable procedures and treating other patients.  Critical care was necessary to treat or prevent imminent or life-threatening deterioration.  Critical care was time spent personally by me on the following activities: development of treatment plan with patient and/or surrogate as well as nursing, discussions with consultants, evaluation of patient's response to treatment, examination of patient, obtaining history from patient or surrogate, ordering and performing treatments and interventions, ordering and review of laboratory studies, ordering and review of radiographic studies, pulse oximetry and re-evaluation of patient's condition.   Darcella Gasman Trennon Torbeck, PA-C  Pulmonary & Critical care See Amion for pager If no response to pager , please call [phone] until 7pm After 7:00 pm call Elink  [phone]

## 2022-10-03 NOTE — Progress Notes (Signed)
CT imaging reviewed with the radiologist. No free air or evidence of bowel compromise. Known LIH visualized without overt evidence of ischemia or strangulation. Given patient's high risk of morbidity and mortality, would recommend continuing supportive care at this time.   Diamantina Monks, MD General and Trauma Surgery [facility]

## 2022-10-04 DIAGNOSIS — J9601 Acute respiratory failure with hypoxia: Secondary | ICD-10-CM | POA: Diagnosis not present

## 2022-10-04 LAB — GLUCOSE, CAPILLARY
Glucose-Capillary: 134 mg/dL — ABNORMAL HIGH (ref 70–99)
Glucose-Capillary: 117 mg/dL — ABNORMAL HIGH (ref 70–99)
Glucose-Capillary: 87 mg/dL (ref 70–99)
Glucose-Capillary: 94 mg/dL (ref 70–99)
Glucose-Capillary: 108 mg/dL — ABNORMAL HIGH (ref 70–99)
Glucose-Capillary: 117 mg/dL — ABNORMAL HIGH (ref 70–99)

## 2022-10-04 LAB — CBC
HCT: 36 % (ref 36.0–46.0)
Hemoglobin: 11.8 g/dL — ABNORMAL LOW (ref 12.0–15.0)
MCH: 28.6 pg (ref 26.0–34.0)
MCHC: 32.8 g/dL (ref 30.0–36.0)
MCV: 87.2 fL (ref 80.0–100.0)
Platelets: 129 10*3/uL — ABNORMAL LOW (ref 150–400)
RBC: 4.13 MIL/uL (ref 3.87–5.11)
RDW: 23.5 % — ABNORMAL HIGH (ref 11.5–15.5)
WBC: 10.5 10*3/uL (ref 4.0–10.5)
nRBC: 1.1 % — ABNORMAL HIGH (ref 0.0–0.2)

## 2022-10-04 LAB — LACTIC ACID, PLASMA: Lactic Acid, Venous: 2.2 mmol/L (ref 0.5–1.9)

## 2022-10-04 LAB — COMPREHENSIVE METABOLIC PANEL
ALT: 887 U/L — ABNORMAL HIGH (ref 0–44)
AST: 1079 U/L — ABNORMAL HIGH (ref 15–41)
Albumin: 3.8 g/dL (ref 3.5–5.0)
Alkaline Phosphatase: 102 U/L (ref 38–126)
Anion gap: 23 — ABNORMAL HIGH (ref 5–15)
BUN: 46 mg/dL — ABNORMAL HIGH (ref 8–23)
CO2: 20 mmol/L — ABNORMAL LOW (ref 22–32)
Calcium: 8.3 mg/dL — ABNORMAL LOW (ref 8.9–10.3)
Chloride: 85 mmol/L — ABNORMAL LOW (ref 98–111)
Creatinine, Ser: 6.89 mg/dL — ABNORMAL HIGH (ref 0.44–1.00)
GFR, Estimated: 6 mL/min — ABNORMAL LOW (ref >60)
Glucose, Bld: 117 mg/dL — ABNORMAL HIGH (ref 70–99)
Potassium: 5.3 mmol/L — ABNORMAL HIGH (ref 3.5–5.1)
Sodium: 128 mmol/L — ABNORMAL LOW (ref 135–145)
Total Bilirubin: 2.8 mg/dL — ABNORMAL HIGH (ref 0.3–1.2)
Total Protein: 8.2 g/dL — ABNORMAL HIGH (ref 6.5–8.1)

## 2022-10-04 LAB — HEPATITIS B SURFACE ANTIBODY, QUANTITATIVE: Hep B S AB Quant (Post): 50.7 m[IU]/mL (ref >9.9)

## 2022-10-04 LAB — MAGNESIUM: Magnesium: 3 mg/dL — ABNORMAL HIGH (ref 1.7–2.4)

## 2022-10-04 LAB — TROPONIN I (HIGH SENSITIVITY): Troponin I (High Sensitivity): 217 ng/L (ref <18)

## 2022-10-04 MED ORDER — ALTEPLASE 2 MG IJ SOLR: 2.0000 mg | Freq: Once | INTRAMUSCULAR | Status: DC | PRN

## 2022-10-04 MED ORDER — SODIUM CHLORIDE 0.9 % IV SOLN
3.0000 g | INTRAVENOUS | Status: DC
  Administered 2022-10-04 – 2022-10-05 (×2): 3 g via INTRAVENOUS
  Filled 2022-10-04 (×2): qty 8

## 2022-10-04 MED ORDER — METHYLPREDNISOLONE SODIUM SUCC 40 MG IJ SOLR
40.0000 mg | INTRAMUSCULAR | Status: AC
  Administered 2022-10-05 – 2022-10-07 (×3): 40 mg via INTRAVENOUS
  Filled 2022-10-04 (×3): qty 1

## 2022-10-04 MED ORDER — ANTICOAGULANT SODIUM CITRATE 4% (200MG/5ML) IV SOLN: 5.0000 mL | Status: DC | PRN

## 2022-10-04 MED ORDER — HEPARIN SODIUM (PORCINE) 1000 UNIT/ML DIALYSIS
1000.0000 [IU] | INTRAMUSCULAR | Status: DC | PRN
  Administered 2022-10-04: 1000 [IU]
  Administered 2022-10-06: 3800 [IU]
  Filled 2022-10-04 (×3): qty 1

## 2022-10-04 NOTE — Progress Notes (Signed)
The patient was transferred to [facility] room 10, without any complications, belongings place at bedside.

## 2022-10-04 NOTE — Progress Notes (Signed)
Pt arrived to the unit. Daughter at bedside with pt. VSS. All pending questions answered at this time. Bed is lowest position and bed alarm on.

## 2022-10-04 NOTE — Progress Notes (Signed)
[facility] Progress Note  Subjective: on HD this am, 1500 cc UF. BP's stable.   Vitals:   10/04/22 1115 10/04/22 1126 10/04/22 1128 10/04/22 1134  BP: (!) 132/52  (!) 127/54 (!) 139/50  Pulse: 94  92 97  Resp: 19  (!) 21 (!) 21  Temp:  98.7 F (37.1 C)  97.6 F (36.4 C)  TempSrc:  Oral  Oral  SpO2: 97%  97% 95%  Weight:    57.3 kg    Exam: Gen alert, no distress No rash, cyanosis or gangrene Sclera anicteric, throat clear  No jvd or bruits Chest slightly basilar crackles and slight exp wheezing off and on RRR no RG Abd soft ntnd no mass or ascites +bs GU defer MS no joint effusions or deformity Ext no LE or UE edema, no wounds or ulcers Neuro is alert, Ox 3 , nf      Home meds include - albuterol, amiodarone, eliquis, gabapentin, hydrocodone prn, synthroid 125 mcg, sl ntg prn, prn's/ vits/ supps      OP HD: SW MWF 3.5h  400/600   58.5kjg  2/2 bath  AVF  Heparin none - last HD 5/27, post wt 57.9kg - sensipar 30mg  po tiw - hectorol 4 mcg IV tiw    CTA chest  5/17 - IMPRESSION: 1. No pulmonary embolus allowing for motion artifact limitations. 2. Multi chamber cardiomegaly with reflux of contrast into the hepatic veins and IVC consistent with elevated right heart pressures. 3. Mosaic attenuation of the pulmonary parenchyma, can be seen with small airways disease or small vessel disease.    VS 130s/ 70s, HR 90s, RR 26 >> 17, afebrile    [place] at 12 L/ min    Resp panel by CPR --> + for rhinovirus/ enterovirus   Assessment/ Plan: Hypoxemic resp failure - resp panel + for rhinovirus/ enterovirus. CXR w/o sig changes.  Abd pain -  seen by gen surg, plan is to repeat CT abd.  ESRD - on HD MWF. HD today on schedule.  HTN/ volume - bp's wnl, not on pressors. Not on BP lowering meds. Close to dry wt, euvolemic on exam. 1.5 L off w/ HD today.  Anemia esrd - Hb 11- 12 here, no esa needs MBD ckd - CCa in range, add on phos. Cont sensipar and IV vdra.  H/o pHTN H/o  atrial fib        Vinson Moselle MD [facility] 10/04/2022, 12:18 PM  Recent Labs  Lab 10/03/22 1440 10/03/22 1617 10/03/22 1935 10/04/22 0058  HGB 12.9  --   --  11.8*  ALBUMIN 4.0  --   --  3.8  CALCIUM 8.1*  --  8.6* 8.3*  PHOS  --  6.0*  --   --   CREATININE 5.93*  --  6.45* 6.89*  K 6.3*  --  4.6 5.3*   No results for input(s): "IRON", "TIBC", "FERRITIN" in the last 168 hours. Inpatient medications:  Chlorhexidine Gluconate Cloth  6 each Topical Daily   Chlorhexidine Gluconate Cloth  6 each Topical Q0600   cinacalcet  30 mg Oral Q M,W,F-HD   doxercalciferol  4 mcg Intravenous Q M,W,F-HD   heparin injection (subcutaneous)  5,000 Units Subcutaneous Q8H   insulin aspart  0-6 Units Subcutaneous Q4H   ipratropium-albuterol  3 mL Nebulization Q6H   levothyroxine  125 mcg Oral Q0600   [START ON 10/05/2022] methylPREDNISolone (SOLU-MEDROL) injection  40 mg Intravenous Q24H    ampicillin-sulbactam (UNASYN) IV  anticoagulant sodium citrate     alteplase, anticoagulant sodium citrate, benzonatate, docusate sodium, heparin, HYDROcodone bit-homatropine, HYDROmorphone (DILAUDID) injection, mouth rinse, polyethylene glycol

## 2022-10-04 NOTE — Progress Notes (Signed)
POST HD TX NOTE  10/04/22 1134  Vitals  Temp 97.6 F (36.4 C)  Temp Source Oral  BP (!) 139/50  MAP (mmHg) 77  BP Location Right Arm  BP Method Automatic  Patient Position (if appropriate) Lying  Pulse Rate 97  Pulse Rate Source Monitor  ECG Heart Rate 98  Resp (!) 21  Oxygen Therapy  SpO2 95 %  O2 Device Nasal Cannula  O2 Flow Rate (L/min) 4 L/min  Pulse Oximetry Type Continuous  During Treatment Monitoring  Intra-Hemodialysis Comments (S)   (post HD tx VS check)  Post Treatment  Dialyzer Clearance Clear  Duration of HD Treatment -hour(s) 3.5 hour(s)  Hemodialysis Intake (mL) 0 mL  Liters Processed 84  Fluid Removed (mL) 1500 mL  Tolerated HD Treatment Yes  Post-Hemodialysis Comments (S)  tx completed w/o problem, UF goal met, blood rinsed back, VSS. Medication Admin: Hectorol IVP, Heparin Dwells 3800 units  Hemodialysis Catheter Right Subclavian Double lumen Permanent (Tunneled)  No placement date or time found.   Placed prior to admission: Yes  Orientation: Right  Access Location: Subclavian  Hemodialysis Catheter Type: Double lumen Permanent (Tunneled)  Site Condition No complications  Blue Lumen Status Heparin locked;Dead end cap in place  Red Lumen Status Heparin locked;Dead end cap in place  Purple Lumen Status N/A  Catheter fill solution Heparin 1000 units/ml  Catheter fill volume (Arterial) 1.9 cc  Catheter fill volume (Venous) 1.9  Dressing Type Transparent  Dressing Status Antimicrobial disc in place;Clean, Dry, Intact  Drainage Description None  Dressing Change Due 10/10/22  Post treatment catheter status Capped and Clamped

## 2022-10-04 NOTE — Progress Notes (Signed)
eLink Physician-Brief Progress Note Patient Name: Morgan Clay DOB: 11-25-1945 MRN: [id number]   Date of Service  10/04/2022  HPI/Events of Note  77 year old female with a history of ESRD on HD MWF being evaluated for a potassium level of 5.3.  eICU Interventions  No changes noted on telemetry monitor.  Patient will be dialyzed today to correct her potassium. Discussed with bedside nurse.     Intervention Category Minor Interventions: Electrolytes abnormality - evaluation and management  Carilyn Goodpasture 10/04/2022, 3:18 AM

## 2022-10-04 NOTE — Progress Notes (Signed)
NAME:  Morgan Clay, MRN:  [id number], DOB:  April 11, 1946, LOS: 2 ADMISSION DATE:  10/02/2022, CONSULTATION DATE:  10/02/22 REFERRING MD:  Ernie Avena, MD CHIEF COMPLAINT:  Respiratory Failure  History of Present Illness:  Morgan Clay is a 76 year old woman, never smoker with history of heart failure, pulmonary hypertension, asthma, ESRD on HD, cirrhosis secondary to hepatitis C s/p Harvoni treatment, atrial fibrillation and esophageal varices/small bowel AVMs who presented to [facility] with shortness of breath after dialysis, she was tachypneic with an SpO2 on arrival in the ER.   She was noted to have Lactic acid of 7.7 and was given a bolus with repeat lactic acid 7.6. Due to increase work of breathing she was placed on non-rebreather mask. She was given duoneb and solumedrol 125mg  IV. CTA Chest was negative for PE, there was motion artifact but mosaic attenuation of the pulmonary parenchyma. CT Neck showed possible mild pharyngeal mucosal swelling, but again difficult due to motion artifact. Neck CT was done for neck swelling. CT abdomen done for abdominal pain with no acute findings.   PCCM consulted as patient remained tachypneic with increased work of breathing after transfer from non-rebreather to [facility].   Patient accepted in transfer from [facility] to [facility] ICU.  Pertinent  Medical History   Asthma Pulmonary Hypertension OSA Chronic Systolic Heart Failure Atrial Fibrillation DMII ESRD on HD Cirrhosis Hepatitis C s/p Harvoni Esophageal Varices Duodenal AVM  Significant Hospital Events: Including procedures, antibiotic start and stop dates in addition to other pertinent events   5/27 presented to [facility], transferred to [facility] 5/28 remains on 12L, stable, viral panel + for rhinovirus/Enterovirus  5/29 lactic improving, surgery following serial abdominal exams, down to 4L [place]  Interim History / Subjective:    Objective    Blood pressure (!) 108/59, pulse 90, temperature 97.7 F (36.5 C), temperature source Oral, resp. rate 13, weight 58.8 kg, SpO2 99 %.        Intake/Output Summary (Last 24 hours) at 10/04/2022 1011 Last data filed at 10/03/2022 1900 Gross per 24 hour  Intake 100.07 ml  Output --  Net 100.07 ml    Filed Weights   10/03/22 0500 10/04/22 0432 10/04/22 0741  Weight: 59.5 kg 60.5 kg 58.8 kg     General:  chronically and acutely ill-appearing F, uncomfortable appearing HEENT: MM pink/moist Neuro: alert and oriented, moving all extremities  CV: s1s2 rrr, no m/r/g PULM:  mildly diminished in the bilateral bases on 12L O2 without rhonchi, wheezing or accessory muscle yse GI: soft, severely and diffusely tender, no rebound, intermittently guarding Extremities: warm/dry, no edema  Skin: no rashes or lesions    [facility] 06/23/20 R[facility] Procedural Findings: Hemodynamics (mmHg) RA mean 21 RV 49/20 PA 59/24, mean 39 PCWP mean 26  Oxygen saturations: PA 64% AO 98%  Cardiac Output (Fick) 5.07  Cardiac Index (Fick) 3.23 PVR 2.6 WU  Cardiac Output (Thermo) 4.72  Cardiac Index (Thermo) 3.01  PVR 2.75 WU  PAPI 1.7 Resolved Hospital Problem list     Assessment & Plan:      Acute Hypoxemic Respiratory Failure secondary to viral pneumonia and asthma exacerbation Pulmonary Hypertension History of OSA Improved to 4L [place] CTA chest negative for PE - goal SpO2 92%  - CPAP at night or when sleeping during day - volume management with iHD - Hycodan cough syrup for cough - viral panel + for rhinovirus and enterovirus  -decrease solumedrol to 40mg   qd, plan for 5 days   Abdominal Pain Cirrhosis Elevated Liver Enzymes Lactic acidosis - initial CTA without acute pathology - Liver enzymes elevated in setting of hepatic congestion due to Right heart failure and hypoxemia - RUQ [name] without hepatic vein thrombosis, shows cirrhosis and likely steatosis  -appreciate surgery consult, her  mortality risk with ex-lap would be high, thankfully her pain and lactic acidosis are improving today, procal was 9.9, empirically cover abdominal process with 5 days of unasyn   Non-Ischemic Cardiomyopathy ICD Right Heart Failure Atrial Fibrillation Aortic Insufficiency Elevated Troponin Echo 5/28 with EF 40-45% decreased from 50% 6 months prior  - off coreg and bidil per Heart Failure team from an earlier admission due to hypotension - HD for volume management - initial EKG not concerning for ischemic changes - hold amiodarone due to elevation in liver enzymes at this time  -resume Eliquis when able to take po's  ESRD on HD Mild Hyponatremia Lactic Acidosis - appreciate nephrology consult, on M/W/F schedule  Anemia of Chronic Disease - transfuse for hemoglobin < 7g/dL   Best Practice (right click and "Reselect all SmartList Selections" daily)   Diet/type: NPO w/ oral meds DVT prophylaxis: SCD GI prophylaxis: N/A Lines: N/A Foley:  N/A Code Status:  full code Last date of multidisciplinary goals of care discussion [discussed with patient at bedside 5/27 - wishes to be full code] pt updated at the beside, will update daughter   Labs   CBC: Recent Labs  Lab 09/28/22 1834 09/30/22 1515 10/02/22 1616 10/02/22 1740 10/03/22 1440 10/04/22 0058  WBC 5.9 5.0 14.2*  --  12.1* 10.5  NEUTROABS  --  3.7 12.2*  --   --   --   HGB 11.9* 11.9* 12.5 15.0 12.9 11.8*  HCT 38.0 37.6 38.6 44.0 39.1 36.0  MCV 90.9 90.0 87.7  --  86.5 87.2  PLT 145* 140* 147*  --  139* 129*     Basic Metabolic Panel: Recent Labs  Lab 09/30/22 1515 10/02/22 1616 10/02/22 1740 10/03/22 1440 10/03/22 1617 10/03/22 1935 10/04/22 0058  NA 130* 134* 131* 129*  --  129* 128*  K 4.2 4.1 4.4 6.3*  --  4.6 5.3*  CL 90* 90*  --  84*  --  86* 85*  CO2 25 25  --  21*  --  22 20*  GLUCOSE 148* 87  --  112*  --  84 117*  BUN 18 15  --  32*  --  38* 46*  CREATININE 5.95* 3.69*  --  5.93*  --  6.45*  6.89*  CALCIUM 7.9* 7.7*  --  8.1*  --  8.6* 8.3*  MG  --  2.2  --   --   --   --  3.0*  PHOS  --   --   --   --  6.0*  --   --     GFR: Estimated Creatinine Clearance: 5.6 mL/min (A) (by C-G formula based on SCr of 6.89 mg/dL (H)). Recent Labs  Lab 09/30/22 1515 10/02/22 1616 10/02/22 1626 10/02/22 1813 10/03/22 0017 10/03/22 1333 10/03/22 1440 10/04/22 0058  PROCALCITON  --   --   --   --   --  9.98  --   --   WBC 5.0 14.2*  --   --   --   --  12.1* 10.5  LATICACIDVEN  --   --    < > 7.6* 8.9*  --  4.7* 2.2*   < > =  values in this interval not displayed.     Liver Function Tests: Recent Labs  Lab 09/30/22 1515 10/02/22 1616 10/03/22 0017 10/03/22 1440 10/04/22 0058  AST 31 1,308*  --  1,499* 1,079*  ALT 13 681*  --  962* 887*  ALKPHOS 85 102 96 114 102  BILITOT 0.8 3.1*  --  2.7* 2.8*  PROT 9.1* 9.3*  --  8.6* 8.2*  ALBUMIN 4.2 4.3  --  4.0 3.8    Recent Labs  Lab 10/03/22 0017  LIPASE 34  AMYLASE 1,215*    No results for input(s): "AMMONIA" in the last 168 hours.  ABG    Component Value Date/Time   PHART 7.364 08/12/2017 1353   PCO2ART 43.1 08/12/2017 1353   PO2ART 79.3 (L) 08/12/2017 1353   HCO3 24.2 10/02/2022 1740   TCO2 26 10/02/2022 1740   ACIDBASEDEF 1.0 10/02/2022 1740   O2SAT 38 10/02/2022 1740     Coagulation Profile: Recent Labs  Lab 10/03/22 1333  INR 2.5*    Cardiac Enzymes: No results for input(s): "CKTOTAL", "CKMB", "CKMBINDEX", "TROPONINI" in the last 168 hours.  HbA1C: Hgb A1c MFr Bld  Date/Time Value Ref Range Status  09/05/2022 02:44 PM 5.5 4.6 - 6.5 % Final    Comment:    Glycemic Control Guidelines for People with Diabetes:Non Diabetic:  <6%Goal of Therapy: <7%Additional Action Suggested:  >8%   10/06/2021 11:59 AM 4.9 4.6 - 6.5 % Final    Comment:    Glycemic Control Guidelines for People with Diabetes:Non Diabetic:  <6%Goal of Therapy: <7%Additional Action Suggested:  >8%     CBG: Recent Labs  Lab  10/03/22 1616 10/03/22 1943 10/03/22 2333 10/04/22 0339 10/04/22 0657  GLUCAP 132* 79 131* 134* 117*     Review of Systems:   A 10 point review of systems was performed and is negative unless stated in HPI  Past Medical History:  She,  has a past medical history of Anemia, Angiectasia, Angiodysplasia of small intestine (06/10/2020), Aortic atherosclerosis (HCC) (08/30/2017), Arthritis, Asthma, Biventricular ICD (implantable cardioverter-defibrillator) in place (01/08/2007), 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) (09/24/2020), Chronic obstructive pulmonary disease (HCC), Chronic systolic congestive heart failure (HCC), 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 (HCV) (HCC), Diabetes (HCC), Diverticulitis of left colon status post robotic low anterior to sigmoid resection 05/22/2018 (07/31/2017), Duodenal arteriovenous malformation, End stage renal disease (HCC) (03/22/2021), Esophageal varices (HCC), Glaucoma, Gout, History of blood transfusion (~ 11/2017), Hypertension associated with diabetes (HCC) (06/07/2017), Hypothyroidism, Internal hemorrhoids, LBBB (left bundle branch block), Malnutrition of moderate degree (07/09/2020), MDD (major depressive disorder), recurrent, in partial remission (HCC) (06/07/2017), OBSTRUCTIVE SLEEP APNEA (11/14/2007), OSTEOPENIA (09/30/2008), Persistent atrial fibrillation (HCC) (02/06/2020), Polyneuropathy associated with underlying disease (HCC) (06/09/2021), Pulmonary hypertension (HCC) on echocardiogram (01/14/2018), Secondary esophageal varices without bleeding (HCC) (12/23/2020), Secondary hypercoagulable state (HCC) (02/06/2020), and Type 2 diabetes, controlled, with renal manifestation (HCC) (11/24/2013).   Surgical History:   Past Surgical History:  Procedure Laterality Date   A/V FISTULAGRAM N/A 03/28/2021   Procedure: A/V FISTULAGRAM;  Surgeon: Maeola Harman, MD;  Location: [facility] INVASIVE CV LAB;   Service: Cardiovascular;  Laterality: N/A;   APPENDECTOMY     AV FISTULA PLACEMENT Left 08/05/2020   Procedure: LEFT UPPER EXTREMITY ARTERIOVENOUS (AV) FISTULA CREATION;  Surgeon: Leonie Douglas, MD;  Location: [facility] OR;  Service: Vascular;  Laterality: Left;   BALLOON ENTEROSCOPY N/A 06/21/2022   Procedure: BALLOON ENTEROSCOPY;  Surgeon: Shellia Cleverly, DO;  Location: [facility] ENDOSCOPY;  Service:  Gastroenterology;  Laterality: N/A;   BASCILIC VEIN TRANSPOSITION Left 09/16/2020   Procedure: LEFT SECOND STAGE BASCILIC VEIN TRANSPOSITION;  Surgeon: Leonie Douglas, MD;  Location: [facility] OR;  Service: Vascular;  Laterality: Left;  PERIPHERAL NERVE BLOCK   CARDIAC CATHETERIZATION     CARDIOVERSION N/A 12/14/2014   Procedure: CARDIOVERSION;  Surgeon: Lars Masson, MD;  Location: [facility] ENDOSCOPY;  Service: Cardiovascular;  Laterality: N/A;   CARDIOVERSION N/A 02/26/2017   Procedure: CARDIOVERSION;  Surgeon: Marinus Maw, MD;  Location: [facility] INVASIVE CV LAB;  Service: Cardiovascular;  Laterality: N/A;   CARDIOVERSION N/A 07/24/2017   Procedure: CARDIOVERSION;  Surgeon: Elease Hashimoto Deloris Ping, MD;  Location: [facility] ENDOSCOPY;  Service: Cardiovascular;  Laterality: N/A;   CARDIOVERSION N/A 02/12/2020   Procedure: CARDIOVERSION;  Surgeon: Laurey Morale, MD;  Location: [facility] ENDOSCOPY;  Service: Cardiovascular;  Laterality: N/A;   CARDIOVERSION N/A 09/15/2021   Procedure: CARDIOVERSION;  Surgeon: Laurey Morale, MD;  Location: [facility] ENDOSCOPY;  Service: Cardiovascular;  Laterality: N/A;   CARPAL TUNNEL RELEASE  07/21/2021   COLONOSCOPY N/A 11/08/2017   Procedure: COLONOSCOPY;  Surgeon: Rachael Fee, MD;  Location: [facility] ENDOSCOPY;  Service: Endoscopy;  Laterality: N/A;   COLONOSCOPY WITH PROPOFOL N/A 05/31/2020   Procedure: COLONOSCOPY WITH PROPOFOL;  Surgeon: Hilarie Fredrickson, MD;  Location: [facility] ENDOSCOPY;  Service: Endoscopy;  Laterality: N/A;   COLONOSCOPY WITH PROPOFOL N/A 10/26/2021   Procedure: COLONOSCOPY WITH  PROPOFOL;  Surgeon: Imogene Burn, MD;  Location: [facility] ENDOSCOPY;  Service: Gastroenterology;  Laterality: N/A;   DILATION AND CURETTAGE OF UTERUS     ENTEROSCOPY N/A 03/02/2020   Procedure: ENTEROSCOPY;  Surgeon: Benancio Deeds, MD;  Location: [facility] ENDOSCOPY;  Service: Gastroenterology;  Laterality: N/A;   ENTEROSCOPY N/A 06/08/2022   Procedure: ENTEROSCOPY;  Surgeon: Beverley Fiedler, MD;  Location: [facility] ENDOSCOPY;  Service: Gastroenterology;  Laterality: N/A;   EP IMPLANTABLE DEVICE N/A 10/06/2015   Procedure: BIV ICD Generator Changeout;  Surgeon: Duke Salvia, MD;  Location: [facility] INVASIVE CV LAB;  Service: Cardiovascular;  Laterality: N/A;   ESOPHAGOGASTRODUODENOSCOPY N/A 10/26/2017   Procedure: ESOPHAGOGASTRODUODENOSCOPY (EGD);  Surgeon: Meryl Dare, MD;  Location: [facility] ENDOSCOPY;  Service: Endoscopy;  Laterality: N/A;   ESOPHAGOGASTRODUODENOSCOPY (EGD) WITH PROPOFOL N/A 11/07/2017   Procedure: ESOPHAGOGASTRODUODENOSCOPY (EGD) WITH PROPOFOL;  Surgeon: Rachael Fee, MD;  Location: [facility] ENDOSCOPY;  Service: Endoscopy;  Laterality: N/A;   ESOPHAGOGASTRODUODENOSCOPY (EGD) WITH PROPOFOL N/A 05/29/2020   Procedure: ESOPHAGOGASTRODUODENOSCOPY (EGD) WITH PROPOFOL;  Surgeon: Sherrilyn Rist, MD;  Location: [facility] ENDOSCOPY;  Service: Gastroenterology;  Laterality: N/A;   ESOPHAGOGASTRODUODENOSCOPY (EGD) WITH PROPOFOL N/A 10/26/2021   Procedure: ESOPHAGOGASTRODUODENOSCOPY (EGD) WITH PROPOFOL;  Surgeon: Imogene Burn, MD;  Location: [facility] ENDOSCOPY;  Service: Gastroenterology;  Laterality: N/A;   ESOPHAGOGASTRODUODENOSCOPY (EGD) WITH PROPOFOL N/A 03/08/2022   Procedure: ESOPHAGOGASTRODUODENOSCOPY (EGD) WITH PROPOFOL;  Surgeon: Lynann Bologna, MD;  Location: [facility] ENDOSCOPY;  Service: Gastroenterology;  Laterality: N/A;   GIVENS CAPSULE STUDY N/A 05/19/2020   Procedure: GIVENS CAPSULE STUDY;  Surgeon: Iva Boop, MD;  Location: [facility] ENDOSCOPY;  Service: Endoscopy;  Laterality: N/A;  .adm for obs since  pacemaker, PA wil enter order and see pt   GIVENS CAPSULE STUDY N/A 05/29/2020   Procedure: GIVENS CAPSULE STUDY;  Surgeon: Sherrilyn Rist, MD;  Location: [facility] ENDOSCOPY;  Service: Gastroenterology;  Laterality: N/A;   GIVENS CAPSULE STUDY N/A 06/08/2022   Procedure: GIVENS CAPSULE STUDY;  Surgeon: Beverley Fiedler, MD;  Location: [facility]  ENDOSCOPY;  Service: Gastroenterology;  Laterality: N/A;   HEMATOMA EVACUATION Left 09/24/2020   Procedure: EVACUATION HEMATOMA ARM;  Surgeon: Larina Earthly, MD;  Location: [facility] OR;  Service: Vascular;  Laterality: Left;   HEMOSTASIS CLIP PLACEMENT  06/21/2022   Procedure: HEMOSTASIS CLIP PLACEMENT;  Surgeon: Shellia Cleverly, DO;  Location: [facility] ENDOSCOPY;  Service: Gastroenterology;;   HOT HEMOSTASIS N/A 10/26/2017   Procedure: HOT HEMOSTASIS (ARGON PLASMA COAGULATION/BICAP);  Surgeon: Meryl Dare, MD;  Location: [facility] ENDOSCOPY;  Service: Endoscopy;  Laterality: N/A;   HOT HEMOSTASIS N/A 03/02/2020   Procedure: HOT HEMOSTASIS (ARGON PLASMA COAGULATION/BICAP);  Surgeon: Benancio Deeds, MD;  Location: [facility] ENDOSCOPY;  Service: Gastroenterology;  Laterality: N/A;   HOT HEMOSTASIS N/A 05/31/2020   Procedure: HOT HEMOSTASIS (ARGON PLASMA COAGULATION/BICAP);  Surgeon: Hilarie Fredrickson, MD;  Location: [facility] ENDOSCOPY;  Service: Endoscopy;  Laterality: N/A;   HOT HEMOSTASIS N/A 03/08/2022   Procedure: HOT HEMOSTASIS (ARGON PLASMA COAGULATION/BICAP);  Surgeon: Lynann Bologna, MD;  Location: [facility] ENDOSCOPY;  Service: Gastroenterology;  Laterality: N/A;   HOT HEMOSTASIS N/A 06/08/2022   Procedure: HOT HEMOSTASIS (ARGON PLASMA COAGULATION/BICAP);  Surgeon: Beverley Fiedler, MD;  Location: [facility] ENDOSCOPY;  Service: Gastroenterology;  Laterality: N/A;   HOT HEMOSTASIS N/A 06/21/2022   Procedure: HOT HEMOSTASIS (ARGON PLASMA COAGULATION/BICAP);  Surgeon: Shellia Cleverly, DO;  Location: [facility] ENDOSCOPY;  Service: Gastroenterology;  Laterality: N/A;   ICD GENERATOR CHANGEOUT N/A 07/21/2022    Procedure: ICD GENERATOR CHANGEOUT;  Surgeon: Duke Salvia, MD;  Location: [facility] INVASIVE CV LAB;  Service: Cardiovascular;  Laterality: N/A;   INSERT / REPLACE / REMOVE PACEMAKER  ?2008   IR PERC TUN PERIT CATH WO PORT S&I /IMAG  07/05/2020   PERIPHERAL VASCULAR INTERVENTION Left 03/28/2021   Procedure: PERIPHERAL VASCULAR INTERVENTION;  Surgeon: Maeola Harman, MD;  Location: [facility] INVASIVE CV LAB;  Service: Cardiovascular;  Laterality: Left;   POLYPECTOMY  11/08/2017   Procedure: POLYPECTOMY;  Surgeon: Rachael Fee, MD;  Location: [facility] ENDOSCOPY;  Service: Endoscopy;;   PROCTOSCOPY N/A 05/22/2018   Procedure: RIGID PROCTOSCOPY;  Surgeon: Karie Soda, MD;  Location: [facility] ORS;  Service: General;  Laterality: N/A;   RIGHT HEART CATH N/A 02/13/2018   Procedure: RIGHT HEART CATH;  Surgeon: Laurey Morale, MD;  Location: [facility] INVASIVE CV LAB;  Service: Cardiovascular;  Laterality: N/A;   RIGHT HEART CATH N/A 06/23/2020   Procedure: RIGHT HEART CATH;  Surgeon: Laurey Morale, MD;  Location: [facility] INVASIVE CV LAB;  Service: Cardiovascular;  Laterality: N/A;   RIGHT/LEFT HEART CATH AND CORONARY ANGIOGRAPHY N/A 12/11/2019   Procedure: RIGHT/LEFT HEART CATH AND CORONARY ANGIOGRAPHY;  Surgeon: Laurey Morale, MD;  Location: [facility] INVASIVE CV LAB;  Service: Cardiovascular;  Laterality: N/A;   SUBMUCOSAL TATTOO INJECTION  03/02/2020   Procedure: SUBMUCOSAL TATTOO INJECTION;  Surgeon: Benancio Deeds, MD;  Location: [facility] ENDOSCOPY;  Service: Gastroenterology;;   SUBMUCOSAL TATTOO INJECTION  05/31/2020   Procedure: SUBMUCOSAL TATTOO INJECTION;  Surgeon: Hilarie Fredrickson, MD;  Location: [facility] ENDOSCOPY;  Service: Endoscopy;;   SUBMUCOSAL TATTOO INJECTION  06/21/2022   Procedure: SUBMUCOSAL TATTOO INJECTION;  Surgeon: Shellia Cleverly, DO;  Location: [facility] ENDOSCOPY;  Service: Gastroenterology;;   TEE WITHOUT CARDIOVERSION N/A 08/02/2021   Procedure: TRANSESOPHAGEAL ECHOCARDIOGRAM (TEE);  Surgeon: Laurey Morale, MD;  Location: [facility] ENDOSCOPY;  Service: Cardiovascular;  Laterality: N/A;   TUBAL LIGATION     XI ROBOTIC ASSISTED LOWER ANTERIOR RESECTION N/A 05/22/2018   Procedure: XI  ROBOTIC ASSISTED SIGMOID COLOECTOMY MOBILIZATION OF SPLENIC FLEXURE, FIREFLY ASSESSMENT OF PERFUSION;  Surgeon: Karie Soda, MD;  Location: [facility] ORS;  Service: General;  Laterality: N/A;  ERAS PATHWAY     Social History:   reports that she has never smoked. She has never used smokeless tobacco. She reports that she does not currently use alcohol. She reports that she does not currently use drugs after having used the following drugs: Marijuana.   Family History:  Her family history includes Asthma in her father and sister; Heart attack in her father and mother; Lung cancer in her sister; Stroke in her brother. There is no history of Colon cancer, Esophageal cancer, Pancreatic cancer, Stomach cancer, or Liver disease.   Allergies No Known Allergies   Home Medications  Prior to Admission medications   Medication Sig Start Date End Date Taking? Authorizing Provider  albuterol (VENTOLIN HFA) 108 (90 Base) MCG/ACT inhaler Inhale 2 puffs into the lungs every 6 (six) hours as needed for wheezing or shortness of breath. 04/07/22   Hunsucker, Lesia Sago, MD  amiodarone (PACERONE) 200 MG tablet Take 1 tablet by mouth once daily 08/30/22   Saguier, Ramon Dredge, PA-C  ammonium lactate (LAC-HYDRIN) 12 % lotion Apply daily on forearms. 09/05/22   Sharlene Dory, DO  amoxicillin-clavulanate (AUGMENTIN) 875-125 MG tablet Take 1 tablet by mouth 2 (two) times daily. 09/28/22   Clayborne [name], NP  apixaban (ELIQUIS) 2.5 MG TABS tablet Take 1 tablet (2.5 mg total) by mouth 2 (two) times daily. 03/11/22   Pokhrel, Rebekah Chesterfield, MD  cyclobenzaprine (FLEXERIL) 5 MG tablet Take 5 mg by mouth at bedtime as needed for muscle spasms. 12/07/21   [provider]  gabapentin (NEURONTIN) 300 MG capsule Take 1 capsule (300 mg total) by mouth daily.  Fill one rx. Computer may have sent over 2 Patient taking differently: Take 300 mg by mouth daily. 01/10/22   Saguier, Ramon Dredge, PA-C  HYDROcodone bit-homatropine (HYCODAN) 5-1.5 MG/5ML syrup Take 5 mLs by mouth every 8 (eight) hours as needed for up to 5 days for cough. 09/28/22 10/03/22  Clayborne [name], NP  ipratropium (ATROVENT HFA) 17 MCG/ACT inhaler Inhale 2 puffs into the lungs every 6 (six) hours as needed for wheezing. 03/24/22   Saguier, Ramon Dredge, PA-C  levothyroxine (SYNTHROID) 125 MCG tablet Take 1 tablet (125 mcg total) by mouth daily before breakfast. 01/16/22   Saguier, Ramon Dredge, PA-C  lidocaine-prilocaine (EMLA) cream Apply 1 Application topically as needed.    [provider]  methylPREDNISolone (MEDROL) 4 MG tablet Standard 6 day taper 09/28/22   Clayborne [name], NP  nitroGLYCERIN (NITROSTAT) 0.4 MG SL tablet Place 1 tablet (0.4 mg total) under the tongue every 5 (five) minutes as needed for chest pain. Patient not taking: Reported on 09/28/2022 10/27/21   Leroy Sea, MD  triamcinolone cream (KENALOG) 0.1 % Apply to shins twice dialy for 7 days. 09/05/22   Sharlene Dory, DO  Vitamin D, Ergocalciferol, (DRISDOL) 1.25 MG (50000 UNIT) CAPS capsule Take 1 capsule (50,000 Units total) by mouth every 7 (seven) days. 09/06/22   Sharlene Dory, DO     Critical care time:      Darcella Gasman Zanita Millman, PA-C [facility] See Amion for pager If no response to pager , please call [phone] until 7pm After 7:00 pm call Elink  [phone]

## 2022-10-04 NOTE — Progress Notes (Signed)
Subjective/Chief Complaint:  No acute events overnight.  Continues to note abdominal pain  Objective: Vital signs in last 24 hours: Temp:  [97 F (36.1 C)-97.7 F (36.5 C)] 97.7 F (36.5 C) (05/29 0741) Pulse Rate:  [64-94] 92 (05/29 0915) Resp:  [9-25] 15 (05/29 0915) BP: (107-168)/(36-125) 126/55 (05/29 0915) SpO2:  [89 %-100 %] 100 % (05/29 0915) Weight:  [58.8 kg-60.5 kg] 58.8 kg (05/29 0741)    Intake/Output from previous day: 05/28 0701 - 05/29 0700 In: 100.1 [IV Piggyback:100.1] Out: -  Intake/Output this shift: No intake/output data recorded.  Alert, somewhat oriented Labored and tachypneic respirations Abdomen remains soft, nondistended, diffusely tender CVVHD is running currently  Lab Results:  Recent Labs    10/03/22 1440 10/04/22 0058  WBC 12.1* 10.5  HGB 12.9 11.8*  HCT 39.1 36.0  PLT 139* 129*   BMET Recent Labs    10/03/22 1935 10/04/22 0058  NA 129* 128*  K 4.6 5.3*  CL 86* 85*  CO2 22 20*  GLUCOSE 84 117*  BUN 38* 46*  CREATININE 6.45* 6.89*  CALCIUM 8.6* 8.3*   PT/INR Recent Labs    10/03/22 1333  LABPROT 27.4*  INR 2.5*   ABG Recent Labs    10/02/22 1740  HCO3 24.2    Studies/Results: CT ABDOMEN PELVIS W CONTRAST  Result Date: 10/03/2022 CLINICAL DATA:  Peritonitis or perforation suspected Pancreatitis, acute, severe EXAM: CT ABDOMEN AND PELVIS WITH CONTRAST TECHNIQUE: Multidetector CT imaging of the abdomen and pelvis was performed using the standard protocol following bolus administration of intravenous contrast. RADIATION DOSE REDUCTION: This exam was performed according to the departmental dose-optimization program which includes automated exposure control, adjustment of the mA and/or kV according to patient size and/or use of iterative reconstruction technique. CONTRAST:  75mL OMNIPAQUE IOHEXOL 350 MG/ML SOLN COMPARISON:  CT abdomen pelvis 10/02/2022, CT abdomen pelvis 07/05/2022 FINDINGS: Lower chest: Patchy airspace  opacities of the visualized right lung. Cardiomegaly. Hepatobiliary: Slight nodular contour of the liver. No focal liver abnormality. Vicarious excretion previously administered intravenous contrast. No gallbladder wall thickening or pericholecystic fluid. No biliary dilatation. Pancreas: Diffusely atrophic. No focal lesion. Otherwise normal pancreatic contour. No surrounding inflammatory changes. No main pancreatic ductal dilatation. Spleen: Normal in size without focal abnormality.  Splenule noted. Adrenals/Urinary Tract: No adrenal nodule bilaterally. Question slightly striated nephrogram bilaterally. No hydronephrosis. No hydroureter.  No nephroureterolithiasis. The urinary bladder is unremarkable. No excretion of intravenous contrast from either kidneys on delayed view. Stomach/Bowel: Stomach is within normal limits. No evidence of bowel wall thickening or dilatation. No pneumatosis. The appendix is not definitely identified with no inflammatory changes in the right lower quadrant to suggest acute appendicitis. Vascular/Lymphatic: No abdominal aorta or iliac aneurysm. Mild to moderate atherosclerotic plaque of the aorta and its branches. No abdominal, pelvic, or inguinal lymphadenopathy. Reproductive: A 5 cm Coarsely calcified lesion along the anterior uterine wall suggestive of a degenerative uterine fibroid. Several calcifications within the uterus likely fibroids as well. Otherwise uterus and bilateral adnexa are unremarkable. Question tubal ligation. Other: No intraperitoneal free fluid. No intraperitoneal free gas. No organized fluid collection. Musculoskeletal: Redemonstration of a left inguinal hernia with interval herniation of a short loop of small bowel within the hernia. Associated abdominal defect 1.6 cm. No suspicious lytic or blastic osseous lesions. No acute displaced fracture. Multilevel degenerative changes of the spine. IMPRESSION: 1. Interval development of right lung pneumonia. COVID-19  infection not excluded. 2. Question slightly striated nephrogram bilaterally. No excretion of intravenous contrast from  either kidneys delayed view. Recommend correlation with urinalysis. 3. Left inguinal hernia with interval herniation of a short loop of small bowel within the hernia. No findings to suggest definite ischemia or obstruction. Recommend clinical correlation for incarceration. 4. Cirrhosis with no definite findings of portal hypertension. No focal liver lesions identified. Please note that liver protocol enhanced MR and CT are the most sensitive tests for the screening detection of hepatocellular carcinoma in the high risk setting of cirrhosis. 5.  Aortic Atherosclerosis (ICD10-I70.0). Electronically Signed   By: Tish Frederickson M.D.   On: 10/03/2022 17:21   ECHOCARDIOGRAM COMPLETE  Result Date: 10/03/2022    ECHOCARDIOGRAM REPORT   Patient Name:   Morgan Clay Date of Exam: 10/03/2022 Medical Rec #:  [id number]         Height:       59.0 in Accession #:    [id number]        Weight:       131.2 lb Date of Birth:  1946-02-07         BSA:          1.542 m Patient Age:    76 years          BP:           123/71 mmHg Patient Gender: F                 HR:           86 bpm. Exam Location:  Inpatient Procedure: 2D Echo, Color Doppler and Cardiac Doppler Indications:    Elevated Troponin  History:        Patient has prior history of Echocardiogram examinations, most                 recent 06/24/2020. Pulmonary HTN, Arrythmias:LBBB,                 Signs/Symptoms:Dyspnea; Risk Factors:Diabetes and Hypertension.  Sonographer:    Darlys Gales Referring Phys: [phone] LAURA R GLEASON IMPRESSIONS  1. Left ventricular ejection fraction, by estimation, is 40 to 45%. The left ventricle has mildly decreased function. The left ventricle demonstrates global hypokinesis. There is moderate concentric left ventricular hypertrophy. Left ventricular diastolic function could not be evaluated. There is the interventricular  septum is flattened in systole and diastole, consistent with right ventricular pressure and volume overload.  2. Right ventricular systolic function is mildly reduced. The right ventricular size is moderately enlarged. There is severely elevated pulmonary artery systolic pressure. The estimated right ventricular systolic pressure is 74.3 mmHg.  3. Left atrial size was moderately dilated.  4. Right atrial size was mildly dilated.  5. The mitral valve is grossly normal. Mild to moderate mitral valve regurgitation. No evidence of mitral stenosis.  6. The tricuspid valve is abnormal. Tricuspid valve regurgitation is severe.  7. The aortic valve is tricuspid. Aortic valve regurgitation is moderate. No aortic stenosis is present.  8. The inferior vena cava is dilated in size with <50% respiratory variability, suggesting right atrial pressure of 15 mmHg. Comparison(s): Changes from prior study are noted. LVEF 40-45% which is slightly reduced from 50-55%. Severe TR. AI remains moderate. FINDINGS  Left Ventricle: Left ventricular ejection fraction, by estimation, is 40 to 45%. The left ventricle has mildly decreased function. The left ventricle demonstrates global hypokinesis. The left ventricular internal cavity size was normal in size. There is  moderate concentric left ventricular hypertrophy. The interventricular septum is flattened in systole and diastole, consistent with right  ventricular pressure and volume overload. Left ventricular diastolic function could not be evaluated due to nondiagnostic images. Left ventricular diastolic function could not be evaluated. Right Ventricle: The right ventricular size is moderately enlarged. No increase in right ventricular wall thickness. Right ventricular systolic function is mildly reduced. There is severely elevated pulmonary artery systolic pressure. The tricuspid regurgitant velocity is 3.85 m/s, and with an assumed right atrial pressure of 15 mmHg, the estimated right  ventricular systolic pressure is 74.3 mmHg. Left Atrium: Left atrial size was moderately dilated. Right Atrium: Right atrial size was mildly dilated. Pericardium: There is no evidence of pericardial effusion. Mitral Valve: The mitral valve is grossly normal. Mild to moderate mitral valve regurgitation. No evidence of mitral valve stenosis. Tricuspid Valve: The tricuspid valve is abnormal. Tricuspid valve regurgitation is severe. No evidence of tricuspid stenosis. The flow in the hepatic veins is reversed during ventricular systole. Aortic Valve: The aortic valve is tricuspid. Aortic valve regurgitation is moderate. Aortic regurgitation PHT measures 376 msec. No aortic stenosis is present. Aortic valve mean gradient measures 7.0 mmHg. Aortic valve peak gradient measures 13.4 mmHg. Aortic valve area, by VTI measures 1.05 cm. Pulmonic Valve: The pulmonic valve was grossly normal. Pulmonic valve regurgitation is not visualized. No evidence of pulmonic stenosis. Aorta: The aortic root is normal in size and structure. Venous: The inferior vena cava is dilated in size with less than 50% respiratory variability, suggesting right atrial pressure of 15 mmHg. IAS/Shunts: The atrial septum is grossly normal. Additional Comments: A device lead is visualized in the right atrium and right ventricle.  LEFT VENTRICLE PLAX 2D LVIDd:         3.60 cm   Diastology LVIDs:         2.60 cm   LV e' medial:    6.85 cm/s LV PW:         1.40 cm   LV E/e' medial:  14.5 LV IVS:        1.30 cm   LV e' lateral:   8.38 cm/s LVOT diam:     1.80 cm   LV E/e' lateral: 11.8 LV SV:         31 LV SV Index:   20 LVOT Area:     2.54 cm  RIGHT VENTRICLE RV S prime:     13.40 cm/s TAPSE (M-mode): 1.6 cm LEFT ATRIUM             Index        RIGHT ATRIUM           Index LA Vol (A2C):   49.2 ml 31.91 ml/m  RA Area:     20.60 cm LA Vol (A4C):   72.0 ml 46.70 ml/m  RA Volume:   58.70 ml  38.08 ml/m LA Biplane Vol: 63.8 ml 41.38 ml/m  AORTIC VALVE AV Area  (Vmax):    1.05 cm AV Area (Vmean):   0.96 cm AV Area (VTI):     1.05 cm AV Vmax:           183.00 cm/s AV Vmean:          123.000 cm/s AV VTI:            0.292 m AV Peak Grad:      13.4 mmHg AV Mean Grad:      7.0 mmHg LVOT Vmax:         75.80 cm/s LVOT Vmean:        46.400 cm/s LVOT VTI:  0.120 m LVOT/AV VTI ratio: 0.41 AI PHT:            376 msec MITRAL VALVE               TRICUSPID VALVE MV Area (PHT): 2.97 cm    TR Peak grad:   59.3 mmHg MV Decel Time: 255 msec    TR Vmax:        385.00 cm/s MV E velocity: 99.00 cm/s MV A velocity: 59.20 cm/s  SHUNTS MV E/A ratio:  1.67        Systemic VTI:  0.12 m                            Systemic Diam: 1.80 cm Lennie Odor MD Electronically signed by Lennie Odor MD Signature Date/Time: 10/03/2022/3:10:04 PM    Final    US Abdomen Limited RUQ (LIVER/GB)  Result Date: 10/03/2022 CLINICAL DATA:  Abnormal liver enzymes EXAM: ULTRASOUND ABDOMEN LIMITED RIGHT UPPER QUADRANT COMPARISON:  09/26/2022 FINDINGS: Gallbladder: Gallstones: None Sludge: None Gallbladder Wall: Within normal limits Pericholecystic fluid: None Sonographic Murphy's Sign: Negative per technologist Common bile duct: Diameter: 3 mm Liver: Parenchymal echogenicity: Diffusely increased Contours: Mildly nodular Lesions: None Portal vein: Patent.  Hepatopetal flow Other: None. IMPRESSION: 1. Nodular hepatic contours, suspicious for cirrhosis. 2. Increased echogenicity of the hepatic parenchyma is a nonspecific indicator of hepatocellular dysfunction, most commonly steatosis. Electronically Signed   By: Acquanetta Belling M.D.   On: 10/03/2022 08:52   CT SOFT TISSUE NECK WO CONTRAST  Result Date: 10/02/2022 CLINICAL DATA:  Neck swelling EXAM: CT NECK WITHOUT CONTRAST TECHNIQUE: Multidetector CT imaging of the neck was performed following the standard protocol without intravenous contrast. RADIATION DOSE REDUCTION: This exam was performed according to the departmental dose-optimization program which  includes automated exposure control, adjustment of the mA and/or kV according to patient size and/or use of iterative reconstruction technique. COMPARISON:  None Available. FINDINGS: Pharynx and larynx: Evaluation is somewhat limited by motion artifact. No definite mass possible mild pharyngeal mucosal swelling. No significant airway narrowing. Salivary glands: No inflammation, mass, or stone. Thyroid: Normal. Lymph nodes: None enlarged or abnormal density. Vascular: Evaluation is limited by the absence of intravenous contrast. Within this limitation, no vascular abnormality. Right IJ catheter. Limited intracranial: Negative. Visualized orbits: No acute finding. Status post bilateral lens replacements. Mastoids and visualized paranasal sinuses: Clear. Skeleton: No acute osseous abnormality. Degenerative changes in the cervical spine. Upper chest: Evaluation is limited by motion and beam hardening artifact. Within this limitation, no focal pulmonary opacity or pleural effusion. Left chest cardiac device Other: None. IMPRESSION: Evaluation is somewhat limited by motion artifact and the absence of intravenous contrast. Possible mild pharyngeal mucosal swelling, which could represent pharyngitis in the appropriate clinical setting. No significant airway narrowing. Electronically Signed   By: Wiliam Ke M.D.   On: 10/02/2022 19:07   CT Angio Abd/Pel w/ and/or w/o  Result Date: 10/02/2022 CLINICAL DATA:  Abdominal pain EXAM: CTA ABDOMEN AND PELVIS WITHOUT AND WITH CONTRAST TECHNIQUE: Multidetector CT imaging of the abdomen and pelvis was performed using the standard protocol during bolus administration of intravenous contrast. Multiplanar reconstructed images and MIPs were obtained and reviewed to evaluate the vascular anatomy. RADIATION DOSE REDUCTION: This exam was performed according to the departmental dose-optimization program which includes automated exposure control, adjustment of the mA and/or kV according  to patient size and/or use of iterative reconstruction technique. CONTRAST:  80mL OMNIPAQUE IOHEXOL  350 MG/ML SOLN COMPARISON:  07/05/2022 FINDINGS: VASCULAR Normal contour and caliber of the abdominal aorta. No evidence of aneurysm, dissection, or other acute aortic pathology. Standard branching pattern of the abdominal aorta, with solitary bilateral renal arteries. Moderate mixed calcific atherosclerosis. Review of the MIP images confirms the above findings. NON-VASCULAR Lower chest: Please see separately reported same day examination of the chest. Cardiomegaly. Hepatobiliary: No solid liver abnormality is seen. Coarse contour of the liver. Status post cholecystectomy. Pancreas: Unremarkable. No pancreatic ductal dilatation or surrounding inflammatory changes. Spleen: Normal in size without significant abnormality. Adrenals/Urinary Tract: Adrenal glands are unremarkable. Kidneys are normal, without renal calculi, solid lesion, or hydronephrosis. Bladder is unremarkable. Stomach/Bowel: Stomach is within normal limits. Appendix not clearly visualized and may be surgically absent. No evidence of bowel wall thickening, distention, or inflammatory changes. Vascular/Lymphatic: No significant vascular findings are present. No enlarged abdominal or pelvic lymph nodes. Reproductive: Calcified uterine fibroids. Other: Small left inguinal hernia containing fat and fluid (series 4, image 11). No ascites. Musculoskeletal: No acute or significant osseous findings. IMPRESSION: 1. Normal contour and caliber of the abdominal aorta. No evidence of aneurysm, dissection, or other acute aortic pathology. Moderate mixed calcific atherosclerosis. 2. No acute CT findings of the abdomen or pelvis to explain abdominal pain. 3. Coarse contour of the liver, suggestive of cirrhosis. Correlate with biochemical findings. 4. Small left inguinal hernia containing fat and fluid. 5. Uterine fibroids. Aortic Atherosclerosis (ICD10-I70.0).  Electronically Signed   By: Jearld Lesch M.D.   On: 10/02/2022 19:03   CT Angio Chest PE W and/or Wo Contrast  Result Date: 10/02/2022 CLINICAL DATA:  Pulmonary embolism (PE) suspected, high prob EXAM: CT ANGIOGRAPHY CHEST WITH CONTRAST TECHNIQUE: Multidetector CT imaging of the chest was performed using the standard protocol during bolus administration of intravenous contrast. Multiplanar CT image reconstructions and MIPs were obtained to evaluate the vascular anatomy. RADIATION DOSE REDUCTION: This exam was performed according to the departmental dose-optimization program which includes automated exposure control, adjustment of the mA and/or kV according to patient size and/or use of iterative reconstruction technique. CONTRAST:  75mL OMNIPAQUE IOHEXOL 350 MG/ML SOLN COMPARISON:  Chest radiograph 09/30/2022 FINDINGS: Cardiovascular: Breathing motion artifact limitations. Allowing for this, no evidence of pulmonary embolus. Right-sided dialysis catheter tip in the distal SVC. Left-sided pacemaker in place. Multi chamber cardiomegaly. Contrast refluxes a prominently into the hepatic veins and IVC. No pericardial effusion. Atherosclerosis of the thoracic aorta, no aneurysm. Mediastinum/Nodes: Subcarinal lymph node measures 15 mm. Additional shotty mediastinal lymph nodes. There is no hilar adenopathy. Patulous esophagus. Lungs/Pleura: Motion artifact limitations. Mosaic attenuation of the pulmonary parenchyma. Minor subsegmental atelectasis in the lingula. No features of pulmonary edema. No pleural fluid. Trachea and central airways are clear. Upper Abdomen: Stranding in the right upper quadrant medial to the liver, etiology uncertain. Musculoskeletal: There are no acute or suspicious osseous abnormalities. Review of the MIP images confirms the above findings. IMPRESSION: 1. No pulmonary embolus allowing for motion artifact limitations. 2. Multi chamber cardiomegaly with reflux of contrast into the hepatic veins  and IVC consistent with elevated right heart pressures. 3. Mosaic attenuation of the pulmonary parenchyma, can be seen with small airways disease or small vessel disease. 4. Stranding in the right upper quadrant medial to the liver, only partially included in the field of view, etiology indeterminate. Aortic Atherosclerosis (ICD10-I70.0). Electronically Signed   By: Narda Rutherford M.D.   On: 10/02/2022 18:04    Anti-infectives: Anti-infectives (From admission, onward)    Start  Dose/Rate Route Frequency Ordered Stop   10/04/22 1800  Ampicillin-Sulbactam (UNASYN) 3 g in sodium chloride 0.9 % 100 mL IVPB        3 g 200 mL/hr over 30 Minutes Intravenous Every 24 hours 10/04/22 0856 10/09/22 1759   10/02/22 1845  piperacillin-tazobactam (ZOSYN) IVPB 3.375 g        3.375 g 100 mL/hr over 30 Minutes Intravenous  Once 10/02/22 1831 10/02/22 1917   10/02/22 1815  ceFEPIme (MAXIPIME) 1 g in sodium chloride 0.9 % 100 mL IVPB  Status:  Discontinued        1 g 200 mL/hr over 30 Minutes Intravenous  Once 10/02/22 1810 10/02/22 1831   10/02/22 1815  metroNIDAZOLE (FLAGYL) IVPB 500 mg  Status:  Discontinued        500 mg 100 mL/hr over 60 Minutes Intravenous  Once 10/02/22 1810 10/02/22 1831   10/02/22 1815  vancomycin (VANCOCIN) IVPB 1000 mg/200 mL premix        1,000 mg 200 mL/hr over 60 Minutes Intravenous  Once 10/02/22 1810 10/02/22 2042       Assessment/Plan:  Still notes abdominal pain, but remains afebrile and hemodynamically stable, with normal white count and downtrending lactate now 2.2 as of last night all of which would go against ischemic bowel. Would continue bowel rest today while respiratory status is tenuous.  Will continue to follow closely.   LOS: 2 days    Berna Bue 10/04/2022

## 2022-10-04 NOTE — Procedures (Addendum)
I was present at this dialysis session, have reviewed the session and made  appropriate changes Vinson Moselle MD  [facility] 10/04/2022, 12:23 PM

## 2022-10-04 NOTE — Progress Notes (Signed)
  Transition of Care [facility]) Screening Note   Patient Details  Name: Morgan Clay Date of Birth: Jun 24, 1945   [facility]) CM/SW Contact:    Tom-Johnson, Hershal Coria, RN Phone Number: 10/04/2022, 2:30 PM  Patient admitted for SOB after outpatient dialysis. Patient does outpatient dialysis on a MWF schedule.  Found to have Acute Hypoxemic Respiratory Failure, currently on 4L O2, IV steroids and IV abx. Patient tested positive for Rhinovirus/ Enterovirus.   CM unable to assess at this time d/t AMS. CM will continue to follow as patient progresses with care towards discharge.

## 2022-10-05 ENCOUNTER — Inpatient Hospital Stay (HOSPITAL_COMMUNITY): Payer: 59

## 2022-10-05 DIAGNOSIS — J9601 Acute respiratory failure with hypoxia: Secondary | ICD-10-CM | POA: Diagnosis not present

## 2022-10-05 DIAGNOSIS — I50811 Acute right heart failure: Secondary | ICD-10-CM | POA: Diagnosis not present

## 2022-10-05 LAB — CBC WITH DIFFERENTIAL/PLATELET
Abs Immature Granulocytes: 0 10*3/uL (ref 0.00–0.07)
Basophils Absolute: 0 10*3/uL (ref 0.0–0.1)
Basophils Relative: 0 %
Eosinophils Absolute: 0 10*3/uL (ref 0.0–0.5)
Eosinophils Relative: 0 %
HCT: 38.8 % (ref 36.0–46.0)
Hemoglobin: 12.5 g/dL (ref 12.0–15.0)
Lymphocytes Relative: 3 %
Lymphs Abs: 0.3 10*3/uL — ABNORMAL LOW (ref 0.7–4.0)
MCH: 28.4 pg (ref 26.0–34.0)
MCHC: 32.2 g/dL (ref 30.0–36.0)
MCV: 88.2 fL (ref 80.0–100.0)
Monocytes Absolute: 0.1 10*3/uL (ref 0.1–1.0)
Monocytes Relative: 1 %
Neutro Abs: 8.4 10*3/uL — ABNORMAL HIGH (ref 1.7–7.7)
Neutrophils Relative %: 96 %
Platelets: 123 10*3/uL — ABNORMAL LOW (ref 150–400)
RBC: 4.4 MIL/uL (ref 3.87–5.11)
RDW: 24.5 % — ABNORMAL HIGH (ref 11.5–15.5)
WBC: 8.7 10*3/uL (ref 4.0–10.5)
nRBC: 19 /100 WBC — ABNORMAL HIGH
nRBC: 5.9 % — ABNORMAL HIGH (ref 0.0–0.2)

## 2022-10-05 LAB — BLOOD GAS, ARTERIAL
Acid-Base Excess: 0.3 mmol/L (ref 0.0–2.0)
Bicarbonate: 23.5 mmol/L (ref 20.0–28.0)
Drawn by: 54887
O2 Saturation: 91 %
Patient temperature: 37
pCO2 arterial: 33 mmHg (ref 32–48)
pH, Arterial: 7.46 — ABNORMAL HIGH (ref 7.35–7.45)
pO2, Arterial: 60 mmHg — ABNORMAL LOW (ref 83–108)

## 2022-10-05 LAB — BASIC METABOLIC PANEL
Anion gap: 22 — ABNORMAL HIGH (ref 5–15)
BUN: 34 mg/dL — ABNORMAL HIGH (ref 8–23)
CO2: 20 mmol/L — ABNORMAL LOW (ref 22–32)
Calcium: 8.4 mg/dL — ABNORMAL LOW (ref 8.9–10.3)
Chloride: 93 mmol/L — ABNORMAL LOW (ref 98–111)
Creatinine, Ser: 4.95 mg/dL — ABNORMAL HIGH (ref 0.44–1.00)
GFR, Estimated: 9 mL/min — ABNORMAL LOW (ref >60)
Glucose, Bld: 122 mg/dL — ABNORMAL HIGH (ref 70–99)
Potassium: 4.9 mmol/L (ref 3.5–5.1)
Sodium: 135 mmol/L (ref 135–145)

## 2022-10-05 LAB — MAGNESIUM: Magnesium: 2.5 mg/dL — ABNORMAL HIGH (ref 1.7–2.4)

## 2022-10-05 LAB — GLUCOSE, CAPILLARY: Glucose-Capillary: 140 mg/dL — ABNORMAL HIGH (ref 70–99)

## 2022-10-05 MED ORDER — AQUAPHOR EX OINT
TOPICAL_OINTMENT | CUTANEOUS | Status: DC | PRN
  Filled 2022-10-05: qty 50

## 2022-10-05 MED ORDER — HEPARIN (PORCINE) 25000 UT/250ML-% IV SOLN
800.0000 [IU]/h | INTRAVENOUS | Status: DC
  Administered 2022-10-05: 800 [IU]/h via INTRAVENOUS
  Filled 2022-10-05: qty 250

## 2022-10-05 MED ORDER — LEVOTHYROXINE SODIUM 100 MCG/5ML IV SOLN: 62.5000 ug | Freq: Every day | INTRAVENOUS | Status: DC

## 2022-10-05 MED ORDER — CHLORHEXIDINE GLUCONATE CLOTH 2 % EX PADS
6.0000 | MEDICATED_PAD | Freq: Every day | CUTANEOUS | Status: DC
  Administered 2022-10-06 – 2022-10-11 (×4): 6 via TOPICAL

## 2022-10-05 NOTE — Progress Notes (Signed)
ABG sample collected and sent to Lab. Lab called and notified.  

## 2022-10-05 NOTE — Progress Notes (Signed)
   10/05/22 0000  BiPAP/CPAP/SIPAP  BiPAP/CPAP/SIPAP Pt Type Adult  BiPAP/CPAP/SIPAP Resmed  Reason BIPAP/CPAP not in use Non-compliant  Mask Type Full face mask  Mask Size Large  BiPAP/CPAP /SiPAP Vitals  Pulse Rate 89  Resp 20  SpO2 93 %   Patient stated it was to tight, even when not strapped to her face.RT Titrated flow per RCP protocol. Patient still pulling at mask and slapping at me. Patient on room air vital signs stable at this time bedside RN aware.

## 2022-10-05 NOTE — Progress Notes (Addendum)
Patient non-compliant with cpap machine, refused. Pt also refuses breathing treatments.

## 2022-10-05 NOTE — Progress Notes (Signed)
Pt refused Blood glucose check. MD aware.

## 2022-10-05 NOTE — Progress Notes (Signed)
Nursing notified by [facility] lab to cancel blood culture orders on pt. Will notify MD

## 2022-10-05 NOTE — Progress Notes (Addendum)
ANTICOAGULATION CONSULT NOTE  Pharmacy Consult for heparin Indication: atrial fibrillation  Heparin Dosing Weight: 55 kg  Labs: Recent Labs    10/02/22 1740 10/03/22 1333 10/03/22 1440 10/03/22 1935 10/04/22 0058 10/05/22 0315  HGB  --   --  12.9  --  11.8* 12.5  HCT  --   --  39.1  --  36.0 38.8  PLT  --   --  139*  --  129* 123*  APTT  --  46*  --   --   --   --   LABPROT  --  27.4*  --   --   --   --   INR  --  2.5*  --   --   --   --   CREATININE   < >  --  5.93* 6.45* 6.89* 4.95*   < > = values in this interval not displayed.    Assessment: 6 yof admitted 5/27 with hx ESRD on HD, Hep C-related cirrhosis, afib on apixaban PTA (last dose "past week" per med rec), and esophageal varices/SB AVM's presenting with SOB after HD, found to be positive for rhinovirus/enterovirus. CTA negative for PE. Also being worked up for abdominal pain by General Surgery. Patient refusing PO meds. Pharmacy consulted to initiated heparin infusion, ok'd per [facility] and Surgery discussion. Patient previously on heparin SQ for VTE prophylaxis this admission, last dose 5/30 at 0556. No apixaban doses since PTA. Hg low but stable, plt down a bit at 123 (147 on admission). No current active bleed issues reported.  Will check both aPTT and heparin level initially to confirm apixaban no longer influencing heparin levels. If correlating, will d/c aPTT monitoring.  Goal of Therapy:  Heparin level 0.3-0.7 units/ml aPTT 66-102 seconds Monitor platelets by anticoagulation protocol: Yes   Plan:  D/c heparin SQ No bolus given patient's history of GIB Start heparin infusion at 800 units/hr Check 8hr heparin level/aPTT Monitor daily heparin level and aPTT until confirm correlating, CBC, s/sx bleeding F/u ability to resume apixaban as appropriate   Leia Alf, PharmD, BCPS Clinical Pharmacist 10/05/2022 5:05 PM

## 2022-10-05 NOTE — Hospital Course (Signed)
76yo with hx systolic CHF, ESRD on MWF HD, hep C cirrhosis, afib, esophageal varices/SB AVM's who presented initially to ED with sob after HD. Pt was noted to have increased work of breathing, prompting ICU admit. Work up confirmed rhinovirus. Pt was also being evaluated for abd pains with General Surgery following. O2 requirements improved and pt was transferred to floor.

## 2022-10-05 NOTE — Progress Notes (Signed)
  Progress Note   Patient: Morgan Clay ZOX:[id number] DOB: April 04, 1946 DOA: 10/02/2022     3 DOS: the patient was seen and examined on 10/05/2022   Brief hospital course: 76yo with hx systolic CHF, ESRD on MWF HD, hep C cirrhosis, afib, esophageal varices/SB AVM's who presented initially to ED with sob after HD. Pt was noted to have increased work of breathing, prompting ICU admit. Work up confirmed rhinovirus. Pt was also being evaluated for abd pains with General Surgery following. O2 requirements improved and pt was transferred to floor.  Assessment and Plan: Acute hypoxemic respiratory failure secondary to viral PNA and asthma -Clinically improving overall as demonstrated by O2 requirements down to room air today from 12L high flow O2 -reviewed CXR. Findings of increased patchiness. Suspect related to known rhinovirus infection. Pt is already on unasyn per below  Pulmonary HTN -continue volume management per HD -Discussed with Nephrology. Pt appears slightly below dry wt today  OSA -recommend cont CPAP as tolerated  Abd pain -Unclear etiology -General Surgery following with recs for cont monitoring -Pt is on empiric unasyn  Cirrhosis with elevated LFT's -LFT trends had been trending down -Avoid hepatotoxic meds -Recheck LFT's in AM  Lactic acidosis -Trending down -Continue volume management through HD  Non-ischemic cardiomyopathy with combined systolic and diastolic CHF with EF 40-45% -Appears euvolemic to somewhat dry this AM -Discussed with Nephrology -Per report, pt is off coreg and bidil per Heart Failure team secondary to issues related to hypotension in past admit -Continue volume management via HD  ESRD on MWF HD -discussed with Nephrology today. Slightly below dry wt today -Plan HD tomorrow as scheduled  Lactic acidosis -cont volume management via HD  Anemia of chronic disease -Hemodynamically stable -no longer anemic today  Afib -Rate controlled -As  pt is refusing PO meds, will start heparin gtt for now -Discussed with General Surgery. Heparin is ok      Subjective: Difficult to assess given mentation. Pt confused and combative, swatting away staff  Physical Exam: Vitals:   10/05/22 0000 10/05/22 0414 10/05/22 0632 10/05/22 0809  BP:   (!) 122/50 (!) 126/106  Pulse: 89  76 95  Resp: 20  20 15   Temp:      TempSrc:   Oral   SpO2: 93%  92%   Weight:  55.7 kg     General exam: Asleep, arousable, becomes combative Respiratory system: Normal respiratory effort, no wheezing Cardiovascular system: regular rate, s1, s2 Gastrointestinal system: Soft, nondistended, pt swiping away provider's hand when attempting to palpate Central nervous system: CN2-12 grossly intact, strength intact Extremities: Perfused, no clubbing Skin: Normal skin turgor, no notable skin lesions seen Psychiatry: unable to assess given mentation  Data Reviewed:  Labs reviewed: na 135, K 4.9, Cr 4.95, Mg 2.6, Hgb 12.5  Family Communication: Pt in room, family not at bedside  Disposition: Status is: Inpatient Remains inpatient appropriate because: Severity of illness  Planned Discharge Destination:  Unclear, need PT.OT eval     Author: Rickey Barbara, MD 10/05/2022 4:30 PM  For on call review [email or web address].

## 2022-10-05 NOTE — Progress Notes (Signed)
Subjective/Chief Complaint:  No acute changes, transferred out of the ICU  Objective: Vital signs in last 24 hours: Temp:  [97 F (36.1 C)-98.7 F (37.1 C)] 97.9 F (36.6 C) (05/29 2018) Pulse Rate:  [76-99] 95 (05/30 0809) Resp:  [15-27] 15 (05/30 0809) BP: (116-161)/(48-132) 126/106 (05/30 0809) SpO2:  [90 %-97 %] 92 % (05/30 0981) Weight:  [55.7 kg-57.3 kg] 55.7 kg (05/30 0414) Last BM Date : 10/03/22  Intake/Output from previous day: 05/29 0701 - 05/30 0700 In: 100 [IV Piggyback:100] Out: 1500  Intake/Output this shift: No intake/output data recorded.  Somnolent today Lightly labored respirations Abdomen remains soft, nondistended, diffusely tender-of note, she grimaces and pushes my hand away everywhere that I touch including her arms, legs, hands, and abdomen.   Lab Results:  Recent Labs    10/04/22 0058 10/05/22 0315  WBC 10.5 8.7  HGB 11.8* 12.5  HCT 36.0 38.8  PLT 129* 123*    BMET Recent Labs    10/04/22 0058 10/05/22 0315  NA 128* 135  K 5.3* 4.9  CL 85* 93*  CO2 20* 20*  GLUCOSE 117* 122*  BUN 46* 34*  CREATININE 6.89* 4.95*  CALCIUM 8.3* 8.4*    PT/INR Recent Labs    10/03/22 1333  LABPROT 27.4*  INR 2.5*    ABG Recent Labs    10/02/22 1740  HCO3 24.2     Studies/Results: CT ABDOMEN PELVIS W CONTRAST  Result Date: 10/03/2022 CLINICAL DATA:  Peritonitis or perforation suspected Pancreatitis, acute, severe EXAM: CT ABDOMEN AND PELVIS WITH CONTRAST TECHNIQUE: Multidetector CT imaging of the abdomen and pelvis was performed using the standard protocol following bolus administration of intravenous contrast. RADIATION DOSE REDUCTION: This exam was performed according to the departmental dose-optimization program which includes automated exposure control, adjustment of the mA and/or kV according to patient size and/or use of iterative reconstruction technique. CONTRAST:  75mL OMNIPAQUE IOHEXOL 350 MG/ML SOLN COMPARISON:  CT abdomen  pelvis 10/02/2022, CT abdomen pelvis 07/05/2022 FINDINGS: Lower chest: Patchy airspace opacities of the visualized right lung. Cardiomegaly. Hepatobiliary: Slight nodular contour of the liver. No focal liver abnormality. Vicarious excretion previously administered intravenous contrast. No gallbladder wall thickening or pericholecystic fluid. No biliary dilatation. Pancreas: Diffusely atrophic. No focal lesion. Otherwise normal pancreatic contour. No surrounding inflammatory changes. No main pancreatic ductal dilatation. Spleen: Normal in size without focal abnormality.  Splenule noted. Adrenals/Urinary Tract: No adrenal nodule bilaterally. Question slightly striated nephrogram bilaterally. No hydronephrosis. No hydroureter.  No nephroureterolithiasis. The urinary bladder is unremarkable. No excretion of intravenous contrast from either kidneys on delayed view. Stomach/Bowel: Stomach is within normal limits. No evidence of bowel wall thickening or dilatation. No pneumatosis. The appendix is not definitely identified with no inflammatory changes in the right lower quadrant to suggest acute appendicitis. Vascular/Lymphatic: No abdominal aorta or iliac aneurysm. Mild to moderate atherosclerotic plaque of the aorta and its branches. No abdominal, pelvic, or inguinal lymphadenopathy. Reproductive: A 5 cm Coarsely calcified lesion along the anterior uterine wall suggestive of a degenerative uterine fibroid. Several calcifications within the uterus likely fibroids as well. Otherwise uterus and bilateral adnexa are unremarkable. Question tubal ligation. Other: No intraperitoneal free fluid. No intraperitoneal free gas. No organized fluid collection. Musculoskeletal: Redemonstration of a left inguinal hernia with interval herniation of a short loop of small bowel within the hernia. Associated abdominal defect 1.6 cm. No suspicious lytic or blastic osseous lesions. No acute displaced fracture. Multilevel degenerative changes  of the spine. IMPRESSION: 1. Interval development of  right lung pneumonia. COVID-19 infection not excluded. 2. Question slightly striated nephrogram bilaterally. No excretion of intravenous contrast from either kidneys delayed view. Recommend correlation with urinalysis. 3. Left inguinal hernia with interval herniation of a short loop of small bowel within the hernia. No findings to suggest definite ischemia or obstruction. Recommend clinical correlation for incarceration. 4. Cirrhosis with no definite findings of portal hypertension. No focal liver lesions identified. Please note that liver protocol enhanced MR and CT are the most sensitive tests for the screening detection of hepatocellular carcinoma in the high risk setting of cirrhosis. 5.  Aortic Atherosclerosis (ICD10-I70.0). Electronically Signed   By: Tish Frederickson M.D.   On: 10/03/2022 17:21   ECHOCARDIOGRAM COMPLETE  Result Date: 10/03/2022    ECHOCARDIOGRAM REPORT   Patient Name:   Morgan Clay Date of Exam: 10/03/2022 Medical Rec #:  [id number]         Height:       59.0 in Accession #:    [id number]        Weight:       131.2 lb Date of Birth:  1945/09/20         BSA:          1.542 m Patient Age:    76 years          BP:           123/71 mmHg Patient Gender: F                 HR:           86 bpm. Exam Location:  Inpatient Procedure: 2D Echo, Color Doppler and Cardiac Doppler Indications:    Elevated Troponin  History:        Patient has prior history of Echocardiogram examinations, most                 recent 06/24/2020. Pulmonary HTN, Arrythmias:LBBB,                 Signs/Symptoms:Dyspnea; Risk Factors:Diabetes and Hypertension.  Sonographer:    Darlys Gales Referring Phys: [id number] LAURA R GLEASON IMPRESSIONS  1. Left ventricular ejection fraction, by estimation, is 40 to 45%. The left ventricle has mildly decreased function. The left ventricle demonstrates global hypokinesis. There is moderate concentric left ventricular hypertrophy. Left  ventricular diastolic function could not be evaluated. There is the interventricular septum is flattened in systole and diastole, consistent with right ventricular pressure and volume overload.  2. Right ventricular systolic function is mildly reduced. The right ventricular size is moderately enlarged. There is severely elevated pulmonary artery systolic pressure. The estimated right ventricular systolic pressure is 74.3 mmHg.  3. Left atrial size was moderately dilated.  4. Right atrial size was mildly dilated.  5. The mitral valve is grossly normal. Mild to moderate mitral valve regurgitation. No evidence of mitral stenosis.  6. The tricuspid valve is abnormal. Tricuspid valve regurgitation is severe.  7. The aortic valve is tricuspid. Aortic valve regurgitation is moderate. No aortic stenosis is present.  8. The inferior vena cava is dilated in size with <50% respiratory variability, suggesting right atrial pressure of 15 mmHg. Comparison(s): Changes from prior study are noted. LVEF 40-45% which is slightly reduced from 50-55%. Severe TR. AI remains moderate. FINDINGS  Left Ventricle: Left ventricular ejection fraction, by estimation, is 40 to 45%. The left ventricle has mildly decreased function. The left ventricle demonstrates global hypokinesis. The left ventricular internal cavity size was normal in size. There  is  moderate concentric left ventricular hypertrophy. The interventricular septum is flattened in systole and diastole, consistent with right ventricular pressure and volume overload. Left ventricular diastolic function could not be evaluated due to nondiagnostic images. Left ventricular diastolic function could not be evaluated. Right Ventricle: The right ventricular size is moderately enlarged. No increase in right ventricular wall thickness. Right ventricular systolic function is mildly reduced. There is severely elevated pulmonary artery systolic pressure. The tricuspid regurgitant velocity is 3.85  m/s, and with an assumed right atrial pressure of 15 mmHg, the estimated right ventricular systolic pressure is 74.3 mmHg. Left Atrium: Left atrial size was moderately dilated. Right Atrium: Right atrial size was mildly dilated. Pericardium: There is no evidence of pericardial effusion. Mitral Valve: The mitral valve is grossly normal. Mild to moderate mitral valve regurgitation. No evidence of mitral valve stenosis. Tricuspid Valve: The tricuspid valve is abnormal. Tricuspid valve regurgitation is severe. No evidence of tricuspid stenosis. The flow in the hepatic veins is reversed during ventricular systole. Aortic Valve: The aortic valve is tricuspid. Aortic valve regurgitation is moderate. Aortic regurgitation PHT measures 376 msec. No aortic stenosis is present. Aortic valve mean gradient measures 7.0 mmHg. Aortic valve peak gradient measures 13.4 mmHg. Aortic valve area, by VTI measures 1.05 cm. Pulmonic Valve: The pulmonic valve was grossly normal. Pulmonic valve regurgitation is not visualized. No evidence of pulmonic stenosis. Aorta: The aortic root is normal in size and structure. Venous: The inferior vena cava is dilated in size with less than 50% respiratory variability, suggesting right atrial pressure of 15 mmHg. IAS/Shunts: The atrial septum is grossly normal. Additional Comments: A device lead is visualized in the right atrium and right ventricle.  LEFT VENTRICLE PLAX 2D LVIDd:         3.60 cm   Diastology LVIDs:         2.60 cm   LV e' medial:    6.85 cm/s LV PW:         1.40 cm   LV E/e' medial:  14.5 LV IVS:        1.30 cm   LV e' lateral:   8.38 cm/s LVOT diam:     1.80 cm   LV E/e' lateral: 11.8 LV SV:         31 LV SV Index:   20 LVOT Area:     2.54 cm  RIGHT VENTRICLE RV S prime:     13.40 cm/s TAPSE (M-mode): 1.6 cm LEFT ATRIUM             Index        RIGHT ATRIUM           Index LA Vol (A2C):   49.2 ml 31.91 ml/m  RA Area:     20.60 cm LA Vol (A4C):   72.0 ml 46.70 ml/m  RA Volume:    58.70 ml  38.08 ml/m LA Biplane Vol: 63.8 ml 41.38 ml/m  AORTIC VALVE AV Area (Vmax):    1.05 cm AV Area (Vmean):   0.96 cm AV Area (VTI):     1.05 cm AV Vmax:           183.00 cm/s AV Vmean:          123.000 cm/s AV VTI:            0.292 m AV Peak Grad:      13.4 mmHg AV Mean Grad:      7.0 mmHg LVOT Vmax:  75.80 cm/s LVOT Vmean:        46.400 cm/s LVOT VTI:          0.120 m LVOT/AV VTI ratio: 0.41 AI PHT:            376 msec MITRAL VALVE               TRICUSPID VALVE MV Area (PHT): 2.97 cm    TR Peak grad:   59.3 mmHg MV Decel Time: 255 msec    TR Vmax:        385.00 cm/s MV E velocity: 99.00 cm/s MV A velocity: 59.20 cm/s  SHUNTS MV E/A ratio:  1.67        Systemic VTI:  0.12 m                            Systemic Diam: 1.80 cm Lennie Odor MD Electronically signed by Lennie Odor MD Signature Date/Time: 10/03/2022/3:10:04 PM    Final     Anti-infectives: Anti-infectives (From admission, onward)    Start     Dose/Rate Route Frequency Ordered Stop   10/04/22 1800  Ampicillin-Sulbactam (UNASYN) 3 g in sodium chloride 0.9 % 100 mL IVPB        3 g 200 mL/hr over 30 Minutes Intravenous Every 24 hours 10/04/22 0856 10/09/22 1759   10/02/22 1845  piperacillin-tazobactam (ZOSYN) IVPB 3.375 g        3.375 g 100 mL/hr over 30 Minutes Intravenous  Once 10/02/22 1831 10/02/22 1917   10/02/22 1815  ceFEPIme (MAXIPIME) 1 g in sodium chloride 0.9 % 100 mL IVPB  Status:  Discontinued        1 g 200 mL/hr over 30 Minutes Intravenous  Once 10/02/22 1810 10/02/22 1831   10/02/22 1815  metroNIDAZOLE (FLAGYL) IVPB 500 mg  Status:  Discontinued        500 mg 100 mL/hr over 60 Minutes Intravenous  Once 10/02/22 1810 10/02/22 1831   10/02/22 1815  vancomycin (VANCOCIN) IVPB 1000 mg/200 mL premix        1,000 mg 200 mL/hr over 60 Minutes Intravenous  Once 10/02/22 1810 10/02/22 2042       Assessment/Plan:  Clinical course does not suggest bowel ischemia, rather she has severe multiorgan dysfunction  which has likely been exacerbated by recent/current viral URI.  She remains exceptionally high risk for abdominal surgery which fortunately I do not not think is indicated at this time.  We will try clear liquids and monitor.   LOS: 3 days    Berna Bue 10/05/2022

## 2022-10-05 NOTE — Progress Notes (Signed)
[facility] Progress Note  Subjective: seen in room, pt remains confused.   Vitals:   10/05/22 0000 10/05/22 0414 10/05/22 0632 10/05/22 0809  BP:   (!) 122/50 (!) 126/106  Pulse: 89  76 95  Resp: 20  20 15   Temp:      TempSrc:   Oral   SpO2: 93%  92%   Weight:  55.7 kg      Exam: Gen lethargic, not responding verbally, combative Chest refuses exam RRR no RG Abd soft ntnd  Ext no LE edema Neuro is as above   RIJ TDC in place      Home meds include - albuterol, amiodarone, eliquis, gabapentin, hydrocodone prn, synthroid 125 mcg, sl ntg prn, prn's/ vits/ supps      OP HD: SW MWF 3.5h  400/600   58.5kjg  2/2 bath  RIJ TDC  Heparin none - last HD 5/27, post wt 57.9kg - sensipar 30mg  po tiw - hectorol 4 mcg IV tiw    CTA chest  5/17 - IMPRESSION: 1. No pulmonary embolus allowing for motion artifact limitations. 2. Multi chamber cardiomegaly with reflux of contrast into the hepatic veins and IVC consistent with elevated right heart pressures. 3. Mosaic attenuation of the pulmonary parenchyma, can be seen with small airways disease or small vessel disease.    Resp panel by CPR --> + for rhinovirus/ enterovirus   Assessment/ Plan: Hypoxemic resp failure - was on 12 L HFNC, down to 4 L [place] yest and on is on RA today. Improving. Repeat CXR today pending.  [facility] - resp panel + for rhinovirus/ enterovirus. Initial CXR neg, f/u CT abd 5/28 showed RLL pna. Getting IV abx and wbc improving now.  Abd pain -  seen by gen surg, plan is to repeat CT abd.  AMS - unclear cause, hypoxemia and WBC are improving but pt remains confused. Have d/w pmd.  ESRD - on HD MWF. Next HD tomorrow.  HTN/ volume - bp's wnl, not on any BP lowering meds. 3 kg under dry wt today, very dry mucous membranes. Do not think she is vol overloaded. Follow.  Anemia esrd - Hb > 10 here here, no esa needs MBD ckd - CCa and phos in range. Cont sensipar and IV vdra.  H/o pHTN H/o atrial fib   Rob Arlean Hopping  MD [facility] 10/05/2022, 11:46 AM  Recent Labs  Lab 10/03/22 1440 10/03/22 1617 10/03/22 1935 10/04/22 0058 10/05/22 0315  HGB 12.9  --   --  11.8* 12.5  ALBUMIN 4.0  --   --  3.8  --   CALCIUM 8.1*  --    < > 8.3* 8.4*  PHOS  --  6.0*  --   --   --   CREATININE 5.93*  --    < > 6.89* 4.95*  K 6.3*  --    < > 5.3* 4.9   < > = values in this interval not displayed.    No results for input(s): "IRON", "TIBC", "FERRITIN" in the last 168 hours. Inpatient medications:  Chlorhexidine Gluconate Cloth  6 each Topical Daily   Chlorhexidine Gluconate Cloth  6 each Topical Q0600   cinacalcet  30 mg Oral Q M,W,F-HD   doxercalciferol  4 mcg Intravenous Q M,W,F-HD   heparin injection (subcutaneous)  5,000 Units Subcutaneous Q8H   insulin aspart  0-6 Units Subcutaneous Q4H   ipratropium-albuterol  3 mL Nebulization Q6H   levothyroxine  125 mcg Oral Q0600   methylPREDNISolone (  SOLU-MEDROL) injection  40 mg Intravenous Q24H    ampicillin-sulbactam (UNASYN) IV Stopped (10/04/22 1806)   anticoagulant sodium citrate     alteplase, anticoagulant sodium citrate, benzonatate, docusate sodium, heparin, HYDROcodone bit-homatropine, HYDROmorphone (DILAUDID) injection, mouth rinse, polyethylene glycol

## 2022-10-06 ENCOUNTER — Inpatient Hospital Stay (HOSPITAL_COMMUNITY): Payer: 59 | Admitting: Certified Registered Nurse Anesthetist

## 2022-10-06 ENCOUNTER — Inpatient Hospital Stay (HOSPITAL_COMMUNITY): Payer: 59

## 2022-10-06 DIAGNOSIS — I469 Cardiac arrest, cause unspecified: Secondary | ICD-10-CM

## 2022-10-06 DIAGNOSIS — J9601 Acute respiratory failure with hypoxia: Secondary | ICD-10-CM | POA: Diagnosis not present

## 2022-10-06 DIAGNOSIS — I129 Hypertensive chronic kidney disease with stage 1 through stage 4 chronic kidney disease, or unspecified chronic kidney disease: Secondary | ICD-10-CM | POA: Diagnosis not present

## 2022-10-06 DIAGNOSIS — I50811 Acute right heart failure: Secondary | ICD-10-CM | POA: Diagnosis not present

## 2022-10-06 DIAGNOSIS — Z992 Dependence on renal dialysis: Secondary | ICD-10-CM | POA: Diagnosis not present

## 2022-10-06 DIAGNOSIS — N186 End stage renal disease: Secondary | ICD-10-CM | POA: Diagnosis not present

## 2022-10-06 LAB — COMPREHENSIVE METABOLIC PANEL
ALT: 633 U/L — ABNORMAL HIGH (ref 0–44)
ALT: 515 U/L — ABNORMAL HIGH (ref 0–44)
AST: 314 U/L — ABNORMAL HIGH (ref 15–41)
AST: 321 U/L — ABNORMAL HIGH (ref 15–41)
Albumin: 3.7 g/dL (ref 3.5–5.0)
Albumin: 3.2 g/dL — ABNORMAL LOW (ref 3.5–5.0)
Alkaline Phosphatase: 122 U/L (ref 38–126)
Alkaline Phosphatase: 120 U/L (ref 38–126)
Anion gap: 27 — ABNORMAL HIGH (ref 5–15)
Anion gap: 30 — ABNORMAL HIGH (ref 5–15)
BUN: 64 mg/dL — ABNORMAL HIGH (ref 8–23)
BUN: 27 mg/dL — ABNORMAL HIGH (ref 8–23)
CO2: 21 mmol/L — ABNORMAL LOW (ref 22–32)
CO2: 19 mmol/L — ABNORMAL LOW (ref 22–32)
Calcium: 8.3 mg/dL — ABNORMAL LOW (ref 8.9–10.3)
Calcium: 7.4 mg/dL — ABNORMAL LOW (ref 8.9–10.3)
Chloride: 90 mmol/L — ABNORMAL LOW (ref 98–111)
Chloride: 90 mmol/L — ABNORMAL LOW (ref 98–111)
Creatinine, Ser: 7.14 mg/dL — ABNORMAL HIGH (ref 0.44–1.00)
Creatinine, Ser: 3.69 mg/dL — ABNORMAL HIGH (ref 0.44–1.00)
GFR, Estimated: 6 mL/min — ABNORMAL LOW (ref >60)
GFR, Estimated: 12 mL/min — ABNORMAL LOW (ref >60)
Glucose, Bld: 159 mg/dL — ABNORMAL HIGH (ref 70–99)
Glucose, Bld: 270 mg/dL — ABNORMAL HIGH (ref 70–99)
Potassium: 4.8 mmol/L (ref 3.5–5.1)
Potassium: 4 mmol/L (ref 3.5–5.1)
Sodium: 138 mmol/L (ref 135–145)
Sodium: 139 mmol/L (ref 135–145)
Total Bilirubin: 3.1 mg/dL — ABNORMAL HIGH (ref 0.3–1.2)
Total Bilirubin: 3.5 mg/dL — ABNORMAL HIGH (ref 0.3–1.2)
Total Protein: 8.2 g/dL — ABNORMAL HIGH (ref 6.5–8.1)
Total Protein: 7 g/dL (ref 6.5–8.1)

## 2022-10-06 LAB — TROPONIN I (HIGH SENSITIVITY)
Troponin I (High Sensitivity): 4739 ng/L (ref <18)
Troponin I (High Sensitivity): 6527 ng/L (ref <18)

## 2022-10-06 LAB — POCT I-STAT 7, (LYTES, BLD GAS, ICA,H+H)
Acid-base deficit: 12 mmol/L — ABNORMAL HIGH (ref 0.0–2.0)
Bicarbonate: 18 mmol/L — ABNORMAL LOW (ref 20.0–28.0)
Calcium, Ion: 0.94 mmol/L — ABNORMAL LOW (ref 1.15–1.40)
HCT: 43 % (ref 36.0–46.0)
Hemoglobin: 14.6 g/dL (ref 12.0–15.0)
O2 Saturation: 100 %
Potassium: 4.3 mmol/L (ref 3.5–5.1)
Sodium: 133 mmol/L — ABNORMAL LOW (ref 135–145)
TCO2: 20 mmol/L — ABNORMAL LOW (ref 22–32)
pCO2 arterial: 58.6 mmHg — ABNORMAL HIGH (ref 32–48)
pH, Arterial: 7.095 — CL (ref 7.35–7.45)
pO2, Arterial: 282 mmHg — ABNORMAL HIGH (ref 83–108)

## 2022-10-06 LAB — MAGNESIUM: Magnesium: 2.2 mg/dL (ref 1.7–2.4)

## 2022-10-06 LAB — CBC
HCT: 36.6 % (ref 36.0–46.0)
Hemoglobin: 12 g/dL (ref 12.0–15.0)
MCH: 28.5 pg (ref 26.0–34.0)
MCHC: 32.8 g/dL (ref 30.0–36.0)
MCV: 86.9 fL (ref 80.0–100.0)
Platelets: 142 10*3/uL — ABNORMAL LOW (ref 150–400)
RBC: 4.21 MIL/uL (ref 3.87–5.11)
RDW: 24.5 % — ABNORMAL HIGH (ref 11.5–15.5)
WBC: 11.9 10*3/uL — ABNORMAL HIGH (ref 4.0–10.5)
nRBC: 4.8 % — ABNORMAL HIGH (ref 0.0–0.2)

## 2022-10-06 LAB — CBC WITH DIFFERENTIAL/PLATELET
Abs Immature Granulocytes: 0.1 10*3/uL — ABNORMAL HIGH (ref 0.00–0.07)
Basophils Absolute: 0 10*3/uL (ref 0.0–0.1)
Basophils Relative: 0 %
Eosinophils Absolute: 0 10*3/uL (ref 0.0–0.5)
Eosinophils Relative: 0 %
HCT: 36.3 % (ref 36.0–46.0)
Hemoglobin: 11.4 g/dL — ABNORMAL LOW (ref 12.0–15.0)
Lymphocytes Relative: 3 %
Lymphs Abs: 0.4 10*3/uL — ABNORMAL LOW (ref 0.7–4.0)
MCH: 28.6 pg (ref 26.0–34.0)
MCHC: 31.4 g/dL (ref 30.0–36.0)
MCV: 91.2 fL (ref 80.0–100.0)
Monocytes Absolute: 0.1 10*3/uL (ref 0.1–1.0)
Monocytes Relative: 1 %
Myelocytes: 1 %
Neutro Abs: 11.5 10*3/uL — ABNORMAL HIGH (ref 1.7–7.7)
Neutrophils Relative %: 95 %
Platelets: 94 10*3/uL — ABNORMAL LOW (ref 150–400)
RBC: 3.98 MIL/uL (ref 3.87–5.11)
RDW: 24.7 % — ABNORMAL HIGH (ref 11.5–15.5)
WBC: 12.1 10*3/uL — ABNORMAL HIGH (ref 4.0–10.5)
nRBC: 17 /100 WBC — ABNORMAL HIGH
nRBC: 8.7 % — ABNORMAL HIGH (ref 0.0–0.2)

## 2022-10-06 LAB — GLUCOSE, CAPILLARY
Glucose-Capillary: 127 mg/dL — ABNORMAL HIGH (ref 70–99)
Glucose-Capillary: 139 mg/dL — ABNORMAL HIGH (ref 70–99)
Glucose-Capillary: 144 mg/dL — ABNORMAL HIGH (ref 70–99)
Glucose-Capillary: 161 mg/dL — ABNORMAL HIGH (ref 70–99)
Glucose-Capillary: 279 mg/dL — ABNORMAL HIGH (ref 70–99)
Glucose-Capillary: 317 mg/dL — ABNORMAL HIGH (ref 70–99)
Glucose-Capillary: 63 mg/dL — ABNORMAL LOW (ref 70–99)

## 2022-10-06 LAB — APTT: aPTT: >200 seconds (ref 24–36)

## 2022-10-06 LAB — HEPARIN LEVEL (UNFRACTIONATED): Heparin Unfractionated: 0.64 IU/mL (ref 0.30–0.70)

## 2022-10-06 LAB — LACTIC ACID, PLASMA
Lactic Acid, Venous: 7.8 mmol/L (ref 0.5–1.9)
Lactic Acid, Venous: >9 mmol/L (ref 0.5–1.9)

## 2022-10-06 LAB — PHOSPHORUS: Phosphorus: 7.9 mg/dL — ABNORMAL HIGH (ref 2.5–4.6)

## 2022-10-06 LAB — AMMONIA: Ammonia: 25 umol/L (ref 9–35)

## 2022-10-06 LAB — HEPARIN ANTI-XA: Heparin LMW: 0.32 IU/mL

## 2022-10-06 MED ORDER — SODIUM BICARBONATE 8.4 % IV SOLN
INTRAVENOUS | Status: AC
  Administered 2022-10-06: 50 meq
  Filled 2022-10-06: qty 50

## 2022-10-06 MED ORDER — EPINEPHRINE 1 MG/10ML IJ SOSY: 1.0000 mg | PREFILLED_SYRINGE | Freq: Once | INTRAMUSCULAR | Status: AC

## 2022-10-06 MED ORDER — SODIUM CHLORIDE 0.9 % IV SOLN
250.0000 mL | INTRAVENOUS | Status: DC
  Administered 2022-10-06: 250 mL via INTRAVENOUS

## 2022-10-06 MED ORDER — ORAL CARE MOUTH RINSE: 15.0000 mL | OROMUCOSAL | Status: DC | PRN

## 2022-10-06 MED ORDER — EPINEPHRINE 1 MG/10ML IJ SOSY
PREFILLED_SYRINGE | INTRAMUSCULAR | Status: AC
  Filled 2022-10-06: qty 10

## 2022-10-06 MED ORDER — FENTANYL CITRATE PF 50 MCG/ML IJ SOSY
25.0000 ug | PREFILLED_SYRINGE | INTRAMUSCULAR | Status: DC | PRN
  Administered 2022-10-07 (×6): 50 ug via INTRAVENOUS
  Administered 2022-10-08 (×2): 75 ug via INTRAVENOUS
  Administered 2022-10-08: 50 ug via INTRAVENOUS
  Filled 2022-10-06 (×3): qty 1
  Filled 2022-10-06: qty 2
  Filled 2022-10-06 (×2): qty 1
  Filled 2022-10-06: qty 2
  Filled 2022-10-06 (×2): qty 1

## 2022-10-06 MED ORDER — PHENYLEPHRINE 80 MCG/ML (10ML) SYRINGE FOR IV PUSH (FOR BLOOD PRESSURE SUPPORT)
PREFILLED_SYRINGE | INTRAVENOUS | Status: AC
  Administered 2022-10-06: 800 ug via INTRAVENOUS
  Filled 2022-10-06: qty 10

## 2022-10-06 MED ORDER — NOREPINEPHRINE 4 MG/250ML-% IV SOLN
INTRAVENOUS | Status: AC
  Administered 2022-10-06: 10 ug/min via INTRAVENOUS
  Filled 2022-10-06: qty 250

## 2022-10-06 MED ORDER — FENTANYL CITRATE PF 50 MCG/ML IJ SOSY: 25.0000 ug | PREFILLED_SYRINGE | INTRAMUSCULAR | Status: DC | PRN

## 2022-10-06 MED ORDER — HEPARIN SODIUM (PORCINE) 1000 UNIT/ML IJ SOLN
INTRAMUSCULAR | Status: AC
  Administered 2022-10-06: 1900 [IU]
  Filled 2022-10-06: qty 3

## 2022-10-06 MED ORDER — RENA-VITE PO TABS
1.0000 | ORAL_TABLET | Freq: Every day | ORAL | Status: DC
  Filled 2022-10-06: qty 1

## 2022-10-06 MED ORDER — ENOXAPARIN SODIUM 60 MG/0.6ML IJ SOSY
50.0000 mg | PREFILLED_SYRINGE | INTRAMUSCULAR | Status: DC
  Administered 2022-10-06: 50 mg via SUBCUTANEOUS
  Filled 2022-10-06: qty 0.6

## 2022-10-06 MED ORDER — POLYETHYLENE GLYCOL 3350 17 G PO PACK
17.0000 g | PACK | Freq: Every day | ORAL | Status: DC
  Administered 2022-10-07 – 2022-10-16 (×6): 17 g
  Filled 2022-10-06 (×6): qty 1

## 2022-10-06 MED ORDER — PIPERACILLIN-TAZOBACTAM 3.375 G IVPB 30 MIN
3.3750 g | Freq: Once | INTRAVENOUS | Status: AC
  Administered 2022-10-06: 3.375 g via INTRAVENOUS
  Filled 2022-10-06: qty 50

## 2022-10-06 MED ORDER — DOCUSATE SODIUM 50 MG/5ML PO LIQD
100.0000 mg | Freq: Two times a day (BID) | ORAL | Status: DC
  Administered 2022-10-07 – 2022-10-16 (×12): 100 mg
  Filled 2022-10-06 (×12): qty 10

## 2022-10-06 MED ORDER — PHENYLEPHRINE 80 MCG/ML (10ML) SYRINGE FOR IV PUSH (FOR BLOOD PRESSURE SUPPORT): 80.0000 ug | PREFILLED_SYRINGE | Freq: Once | INTRAVENOUS | Status: AC | PRN

## 2022-10-06 MED ORDER — ROCURONIUM BROMIDE 10 MG/ML (PF) SYRINGE
100.0000 mg | PREFILLED_SYRINGE | Freq: Once | INTRAVENOUS | Status: AC
  Administered 2022-10-06: 100 mg via INTRAVENOUS

## 2022-10-06 MED ORDER — FAMOTIDINE 20 MG PO TABS
10.0000 mg | ORAL_TABLET | Freq: Every day | ORAL | Status: DC
  Administered 2022-10-07 – 2022-10-08 (×2): 10 mg
  Filled 2022-10-06 (×3): qty 1

## 2022-10-06 MED ORDER — NOREPINEPHRINE 4 MG/250ML-% IV SOLN
2.0000 ug/min | INTRAVENOUS | Status: DC
  Administered 2022-10-08: 2 ug/min via INTRAVENOUS
  Administered 2022-10-09: 5 ug/min via INTRAVENOUS
  Administered 2022-10-10: 7 ug/min via INTRAVENOUS
  Administered 2022-10-10: 5 ug/min via INTRAVENOUS
  Administered 2022-10-11: 2 ug/min via INTRAVENOUS
  Filled 2022-10-06 (×3): qty 250

## 2022-10-06 MED ORDER — ETOMIDATE 2 MG/ML IV SOLN
20.0000 mg | Freq: Once | INTRAVENOUS | Status: AC
  Administered 2022-10-06: 20 mg via INTRAVENOUS

## 2022-10-06 MED ORDER — DEXTROSE 50 % IV SOLN
INTRAVENOUS | Status: AC
  Filled 2022-10-06: qty 50

## 2022-10-06 MED ORDER — DEXTROSE 50 % IV SOLN
INTRAVENOUS | Status: AC
  Administered 2022-10-06: 50 mL
  Filled 2022-10-06: qty 50

## 2022-10-06 MED ORDER — BOOST / RESOURCE BREEZE PO LIQD CUSTOM: 1.0000 | Freq: Three times a day (TID) | ORAL | Status: DC

## 2022-10-06 MED ORDER — EPINEPHRINE 1 MG/10ML IJ SOSY
PREFILLED_SYRINGE | INTRAMUSCULAR | Status: AC
  Administered 2022-10-06: 1 mg via INTRAVENOUS
  Filled 2022-10-06: qty 10

## 2022-10-06 MED ORDER — DEXTROSE-SODIUM CHLORIDE 5-0.9 % IV SOLN: INTRAVENOUS | Status: DC

## 2022-10-06 MED ORDER — ORAL CARE MOUTH RINSE
15.0000 mL | OROMUCOSAL | Status: DC
  Administered 2022-10-06 – 2022-10-08 (×21): 15 mL via OROMUCOSAL

## 2022-10-06 MED ORDER — PIPERACILLIN-TAZOBACTAM IN DEX 2-0.25 GM/50ML IV SOLN
2.2500 g | Freq: Three times a day (TID) | INTRAVENOUS | Status: DC
  Administered 2022-10-07 – 2022-10-10 (×11): 2.25 g via INTRAVENOUS
  Filled 2022-10-06 (×11): qty 50

## 2022-10-06 MED ORDER — SODIUM CHLORIDE 0.9 % IV SOLN: Freq: Once | INTRAVENOUS | Status: AC

## 2022-10-06 NOTE — Procedures (Signed)
Bronchoscopy Procedure Note  Morgan Clay  [id number]  07-Mar-1946  Date:10/06/22  Time:6:20 PM   Provider Performing:Jsean Taussig   Procedure(s):  Flexible bronchoscopy with bronchial alveolar lavage (40981)  Indication(s) Acute respiratory failure with hypoxia and aspiration pneumonia  Consent Risks of the procedure as well as the alternatives and risks of each were explained to the patient and/or caregiver.  Consent for the procedure was obtained and is signed in the bedside chart  Anesthesia Etomidate and rocuronium   Time Out Verified patient identification, verified procedure, site/side was marked, verified correct patient position, special equipment/implants available, medications/allergies/relevant history reviewed, required imaging and test results available.   Sterile Technique Usual hand hygiene, masks, gowns, and gloves were used   Procedure Description Bronchoscope advanced through endotracheal tube and into airway.  Airways were examined down to subsegmental level with findings noted below.   Following diagnostic evaluation, BAL(s) performed in right lower lobe with normal saline and return of thick pus fluid  Findings: Copious amount of tenacious secretions noted throughout the respiratory tree especially in the right middle and lower lobe   Complications/Tolerance None; patient tolerated the procedure well. Chest X-ray is needed post procedure.   EBL Minimal   Specimen(s) BAL

## 2022-10-06 NOTE — Progress Notes (Signed)
Initial Nutrition Assessment  DOCUMENTATION CODES:   Not applicable  INTERVENTION:  Renal Multivitamin w/ minerals daily Boost Breeze po TID, each supplement provides 250 kcal and 9 grams of protein Continue to advance diet per MD  NUTRITION DIAGNOSIS:  Increased nutrient needs related to chronic illness as evidenced by estimated needs  GOAL:  Patient will meet greater than or equal to 90% of their needs  MONITOR:  PO intake, Supplement acceptance, Labs, I & O's, Diet advancement  REASON FOR ASSESSMENT:  Consult Assessment of nutrition requirement/status  ASSESSMENT:  77 y.o. female presented to the [facility] ED with shortness of breath. PMH includes CHF, ESRD on HD, cirrhosis with esophageal varices, COPD, gout, T2DM, and pulmonary HTN. Pt admitted with acute hypoxemic respiratory failure.   5/27 - Admitted 5/29 - transferred to floor 5/30 - diet advanced to clear liquids  Pt off unit, in HD at time of RD visit. No meal intakes have been recorded. Surgery team has signed off due to no plan for surgery at this time.  Currently under her EDW.   Medications reviewed and include: Cinacalcet, Hectorol, NovoLog SSI, Solu-Medrol, IV antibiotics Labs reviewed: Sodium 138, Potassium 4.8, Hgb A1c 5.5%, CBG: 127-144 x 24 hours   HD on 5/29 EDW: 58.5 kg  Net UF: 1500 mL Post-HD weight: 57.3 kg  NUTRITION - FOCUSED PHYSICAL EXAM: Deferred to follow-up.   Diet Order:   Diet Order             Diet clear liquid Room service appropriate? Yes; Fluid consistency: Thin  Diet effective now                  EDUCATION NEEDS:  Not appropriate for education at this time  Skin:  Skin Assessment: Reviewed RN Assessment  Last BM:  5/28  Height:  Ht Readings from Last 1 Encounters:  09/30/22 4\' 11"  (1.499 m)   Weight:  Wt Readings from Last 1 Encounters:  10/06/22 55.5 kg   BMI:  Body mass index is 24.71 kg/m.  Estimated Nutritional Needs:  Kcal:  1650-1850 Protein:  70-90  grams Fluid:  1L + UOP   Kirby Crigler RD, LDN Clinical Dietitian See [facility] for contact information.

## 2022-10-06 NOTE — Progress Notes (Addendum)
ANTICOAGULATION CONSULT NOTE  Pharmacy Consult for heparin >> Request to change to Lovenox due to patient pulling out IV  Indication: atrial fibrillation  Heparin Dosing Weight: 55 kg  Labs: Recent Labs    10/03/22 1333 10/03/22 1440 10/04/22 0058 10/05/22 0315 10/06/22 0229  HGB  --    < > 11.8* 12.5 12.0  HCT  --    < > 36.0 38.8 36.6  PLT  --    < > 129* 123* 142*  APTT 46*  --   --   --   --   LABPROT 27.4*  --   --   --   --   INR 2.5*  --   --   --   --   HEPARINUNFRC  --   --   --   --  0.64  CREATININE  --    < > 6.89* 4.95* 7.14*   < > = values in this interval not displayed.    Assessment: 60 yof admitted 5/27 with hx ESRD on HD, Hep C-related cirrhosis, afib on apixaban PTA (last dose "past week" per med rec), and esophageal varices/SB AVM's presenting with SOB after HD, found to be positive for rhinovirus/enterovirus. CTA negative for PE. Also being worked up for abdominal pain by General Surgery. Patient refusing PO meds. Pharmacy consulted to initiated heparin infusion, ok'd per [facility] and Surgery discussion. Patient previously on heparin SQ for VTE prophylaxis this admission, last dose 5/30 at 0556. No apixaban doses since PTA. Hg low but stable, plt down a bit at 123 (147 on admission). No current active bleed issues reported.Per surgery progress note 5/30: high risk for abdominal surgery and not indicated at this time.   Pharmacy consulted this AM to change patient from IV Heparin to Lovenox due to patient pulling out IV lines with confusion. Patient refusing some medications. Due to patient being ESRD on HD and that Lovenox is not removed by dialysis, increased bleed risk is of concern. Will dose for CrCl <30 and check level after first dose to ensure we are not placing patient at increased risk of bleeding.  CBC today is stable- within normal limits. No boluses of heparin have been administered. Discussed with Nephrology, Dr. Arlean Hopping: ok with inpatient use, but not to  continue as long-term therapy outpatient.   aPTT this morning came back high >200. Drawn from line where heparin was running. Unsure if heparin was paused and flush as occurred prior to shift change.   Goal of Therapy:  Anti-Xa level 0.6-1 units/ml 4hrs after LMWH dose given Monitor platelets by anticoagulation protocol: Yes   Plan:  D/C Heparin per MD request.  Due to high aPTT, will recheck aPTT to ensure safety before starting Lovenox.  Plan to start Lovenox 50 mg SQ every 24 hours when aPTT at or below goal (~80 or less) Will check low molecular weight heparin level (anti-Xa) 4 hrs after first dose to ensure not over-shooting goal then  F/u ability to resume apixaban as appropriate when patient is taking orals  Addendum: Patient taken to HD - will remove Heparin.  Will time Lovenox for after HD is completed (HD RN will not administer).  Check antiXa 4 hours after dose given.   Link Snuffer, PharmD, BCPS, BCCCP Clinical Pharmacist Please refer to [facility] for [facility] Pharmacy numbers 10/06/2022 7:52 AM

## 2022-10-06 NOTE — Progress Notes (Signed)
Heart Failure Navigator Progress Note  Assessed for Heart & Vascular TOC clinic readiness.  Patient does not meet criteria due to ESRD on hemodialysis.   Navigator will sign off at this time.    Izetta Sakamoto, BSN, RN Heart Failure Nurse Navigator Secure Chat Only   

## 2022-10-06 NOTE — Progress Notes (Signed)
Subjective/Chief Complaint:  No significant change.  Daughters at bedside this morning  Objective: Vital signs in last 24 hours: Temp:  [97.6 F (36.4 C)-97.7 F (36.5 C)] 97.7 F (36.5 C) (05/31 0522) Pulse Rate:  [81-86] 81 (05/31 0807) Resp:  [18-20] 20 (05/31 0807) BP: (110-135)/(52-84) 129/53 (05/31 0807) SpO2:  [94 %-97 %] 94 % (05/31 0807) Weight:  [55.5 kg] 55.5 kg (05/31 0522) Last BM Date : 10/03/22  Intake/Output from previous day: 05/30 0701 - 05/31 0700 In: 71.6 [I.V.:71.6] Out: -  Intake/Output this shift: No intake/output data recorded.  Somnolent today Lightly labored respirations Abdomen remains soft, nondistended, diffusely tender-of note, she grimaces and pushes my hand away everywhere that I touch including her arms, legs, hands, and abdomen.  This is unchanged from yesterday   Lab Results:  Recent Labs    10/05/22 0315 10/06/22 0229  WBC 8.7 11.9*  HGB 12.5 12.0  HCT 38.8 36.6  PLT 123* 142*    BMET Recent Labs    10/05/22 0315 10/06/22 0229  NA 135 138  K 4.9 4.8  CL 93* 90*  CO2 20* 21*  GLUCOSE 122* 159*  BUN 34* 64*  CREATININE 4.95* 7.14*  CALCIUM 8.4* 8.3*    PT/INR Recent Labs    10/03/22 1333  LABPROT 27.4*  INR 2.5*    ABG Recent Labs    10/05/22 1817  PHART 7.46*  HCO3 23.5     Studies/Results: CT HEAD WO CONTRAST ( )  Result Date: 10/05/2022 CLINICAL DATA:  Mental status change, unknown cause. EXAM: CT HEAD WITHOUT CONTRAST TECHNIQUE: Contiguous axial images were obtained from the base of the skull through the vertex without intravenous contrast. RADIATION DOSE REDUCTION: This exam was performed according to the departmental dose-optimization program which includes automated exposure control, adjustment of the mA and/or kV according to patient size and/or use of iterative reconstruction technique. COMPARISON:  Head CT 10/02/2015. FINDINGS: Brain: No acute hemorrhage. Cortical gray-white differentiation is  preserved. Patchy hypoattenuation of the cerebral white matter, most consistent with moderate chronic small-vessel disease. Prominence of the ventricles and sulci within normal limits for age. No extra-axial collection. No mass effect or midline shift. Vascular: No hyperdense vessel or unexpected calcification. Skull: No calvarial fracture or suspicious bone lesion. Skull base is unremarkable. Sinuses/Orbits: Unremarkable. Other: None. IMPRESSION: 1. No acute intracranial abnormality. 2. Moderate chronic small-vessel disease. Electronically Signed   By: Orvan Falconer M.D.   On: 10/05/2022 16:56   DG Chest Port 1 View  Result Date: 10/05/2022 CLINICAL DATA:  Respiratory failure EXAM: PORTABLE CHEST 1 VIEW COMPARISON:  Chest radiograph dated 09/30/2022 FINDINGS: Lines/tubes: Right internal jugular venous catheter tip projects over the superior cavoatrial junction. Left chest wall ICD leads project over the right atrium and ventricle and tributary of the coronary sinus. Lungs: Increased scattered bilateral patchy opacities. Pleura: No pneumothorax or pleural effusion. Heart/mediastinum: Similar enlarged cardiomediastinal silhouette. Bones: No acute osseous abnormality. Ill-defined hyperdensity projecting over the left upper quadrant. IMPRESSION: 1. Increased scattered bilateral patchy opacities, likely multifocal infection/inflammation. 2. Ill-defined hyperdensity projecting over the left upper quadrant, which may represent ingested material. Electronically Signed   By: Agustin Cree M.D.   On: 10/05/2022 15:28    Anti-infectives: Anti-infectives (From admission, onward)    Start     Dose/Rate Route Frequency Ordered Stop   10/04/22 1800  Ampicillin-Sulbactam (UNASYN) 3 g in sodium chloride 0.9 % 100 mL IVPB        3 g 200 mL/hr over 30 Minutes  Intravenous Every 24 hours 10/04/22 0856 10/09/22 1759   10/02/22 1845  piperacillin-tazobactam (ZOSYN) IVPB 3.375 g        3.375 g 100 mL/hr over 30 Minutes  Intravenous  Once 10/02/22 1831 10/02/22 1917   10/02/22 1815  ceFEPIme (MAXIPIME) 1 g in sodium chloride 0.9 % 100 mL IVPB  Status:  Discontinued        1 g 200 mL/hr over 30 Minutes Intravenous  Once 10/02/22 1810 10/02/22 1831   10/02/22 1815  metroNIDAZOLE (FLAGYL) IVPB 500 mg  Status:  Discontinued        500 mg 100 mL/hr over 60 Minutes Intravenous  Once 10/02/22 1810 10/02/22 1831   10/02/22 1815  vancomycin (VANCOCIN) IVPB 1000 mg/200 mL premix        1,000 mg 200 mL/hr over 60 Minutes Intravenous  Once 10/02/22 1810 10/02/22 2042       Assessment/Plan:  Clinical course does not suggest bowel ischemia, rather she has severe multiorgan dysfunction which has likely been exacerbated by recent/current viral URI.  She remains exceptionally high risk for abdominal surgery which fortunately I do not not think is indicated at this time.  From surgery standpoint, okay to advance diet and continue supportive care. We will sign off at this point. Please call with questions or concerns.   From initial consult 5/28: MELD 36 (52.6% 3 month mortality) NSQIP- 37% serious complication, 52% any complication, 17% cardiac event, 60% risk of death associated with exploratory laparotomy- I think this is an underestimate as this calculator does not account for cirrhosis component.    LOS: 4 days    Berna Bue 10/06/2022  Mdm-mod

## 2022-10-06 NOTE — Progress Notes (Signed)
  Progress Note   Patient: Morgan Clay ZOX:[id number] DOB: 19-May-1945 DOA: 10/02/2022     4 DOS: the patient was seen and examined on 10/06/2022   Brief hospital course: 76yo with hx systolic CHF, ESRD on MWF HD, hep C cirrhosis, afib, esophageal varices/SB AVM's who presented initially to ED with sob after HD. Pt was noted to have increased work of breathing, prompting ICU admit. Work up confirmed rhinovirus. Pt was also being evaluated for abd pains with General Surgery following. O2 requirements improved and pt was transferred to floor.  Assessment and Plan: Acute hypoxemic respiratory failure secondary to viral PNA and asthma -Patient had been clinically improving overall since admit as demonstrated by O2 requirements down to room air from 12L high flow O2 -Recent CXR showed increased patchiness. Suspect related to known rhinovirus infection. However, had continued pt on unasyn that was initially empirically started for abd pains below, in order to cover possible underlying PNA as well -On the afternoon of 5/31, was alerted to [facility] by RN staff after resuscitation efforts were already underway. Upon arrival, pt had already been intubated and reportedly had required CPR and one round of epi -Pt has since been transferred back to ICU to continue care. Updated Nephrology of afternoon's events.  Pulmonary HTN -volume management per HD by Nephrology  OSA -recommend cont CPAP as tolerated, however pt has been refusing  Abd pain -Unclear etiology -Pt was on empiric unasyn, had been continued as per above -General Surgery had been following. No indication for surgery noted -Surgery has signed off  Cirrhosis with elevated LFT's -LFT trends are trending down -Avoid hepatotoxic meds  Lactic acidosis -Had been trending down -Continue volume management through HD  Non-ischemic cardiomyopathy with combined systolic and diastolic CHF with EF 40-45% -Per report, pt is off coreg and  bidil per Heart Failure team secondary to issues related to hypotension in past admit -Continue volume management via HD  ESRD on MWF HD -Nephrology following for HD  Lactic acidosis -cont volume management via dialysis  Anemia of chronic disease -no longer anemic today  Afib -Rate controlled -Pt had been refusing PO meds, had changed anticoagulation to therapeutic lovenox -Discussed with General Surgery on 5/30, anticoag was ok     Subjective: Seen on HD. Drowsy, staff reports pt combative at times  Physical Exam: Vitals:   10/06/22 1130 10/06/22 1200 10/06/22 1245 10/06/22 1302  BP: (!) 145/55 132/66 (!) 133/49 (!) 150/60  Pulse:  95 92 81  Resp:  (!) 23 (!) 22 20  Temp:    97.8 F (36.6 C)  TempSrc:      SpO2: 96% 97%  99%  Weight:       General exam: Not conversant, laying in bed  Respiratory system: normal chest rise, clear, no audible wheezing Cardiovascular system: regular rhythm, s1-s2 Gastrointestinal system: Nondistended, nontender, pos BS Central nervous system: No seizures, no tremors Extremities: No cyanosis, no joint deformities Skin: No rashes, no pallor Psychiatry: Unable to assess given mentation   Data Reviewed:  Labs reviewed: Na 138, K 4.8, Cr 7.14, WBC 11.9, Hgb 12.0, Plts 142  Family Communication: Pt in room, family not at bedside  Disposition: Status is: Inpatient Remains inpatient appropriate because: Severity of illness  Planned Discharge Destination:  Unclear    Author: Rickey Barbara, MD 10/06/2022 2:57 PM  For on call review [email or web address].

## 2022-10-06 NOTE — Progress Notes (Signed)
[facility] Progress Note  Subjective: seen in dialysis, less agitated, interacts w/ the daughter minimally.   Vitals:   10/06/22 1200 10/06/22 1245 10/06/22 1302 10/06/22 1600  BP: 132/66 (!) 133/49 (!) 150/60   Pulse: 95 92 81   Resp: (!) 23 (!) 22 20   Temp:   97.8 F (36.6 C)   TempSrc:      SpO2: 97%  99% (!) 80%  Weight:        Exam: Gen lethargic, not responding verbally Chest refuses exam RRR no RG Abd soft ntnd  Ext no LE edema Neuro is as above   RIJ TDC in place      Home meds include - albuterol, amiodarone, eliquis, gabapentin, hydrocodone prn, synthroid 125 mcg, sl ntg prn, prn's/ vits/ supps      OP HD: SW MWF 3.5h  400/600   58.5kjg  2/2 bath  RIJ TDC  Heparin none - last HD 5/27, post wt 57.9kg - sensipar 30mg  po tiw - hectorol 4 mcg IV tiw    CTA chest  5/17 - IMPRESSION: 1. No pulmonary embolus allowing for motion artifact limitations. 2. Multi chamber cardiomegaly with reflux of contrast into the hepatic veins and IVC consistent with elevated right heart pressures. 3. Mosaic attenuation of the pulmonary parenchyma, can be seen with small airways disease or small vessel disease.    Resp panel by CPR --> + for rhinovirus/ enterovirus    CXR 5/30 - IMPRESSION: 1. Increased scattered bilateral patchy opacities, likely multifocal infection/inflammation.   Assessment/ Plan: Hypoxemic resp failure - was on 12 L HFNC, down to 4 L [place] yest and on is on RA now. Improved. Repeat CXR showed increased bilat patchy opacities, likely multifocal infection/ inflammation. Is on IV abx.  [facility] - resp panel + for rhinovirus/ enterovirus. Initial CXR neg, f/u CT abd 5/28 showed "RLL pna". Getting IV abx and wbc improved. F/u CXR as above.  Abd pain -  seen by gen surg, repeat abd CT negative. Surg signed off.  AMS - unclear cause, hypoxemia and WBC are improving but pt remains confused ESRD - on HD MWF. HD today on schedule.  HTN/ volume - bp's wnl, not on  any BP lowering meds. 3 kg under dry wt. Doubt vol overload.  Anemia esrd - Hb > 10 here here, no esa needs MBD ckd - CCa and phos in range. Cont sensipar and IV vdra.  H/o pHTN H/o atrial fib   Rob Arlean Hopping MD [facility] 10/06/2022, 9:05 AM  Recent Labs  Lab 10/03/22 1617 10/03/22 1935 10/04/22 0058 10/05/22 0315 10/06/22 0229 10/06/22 1617  HGB  --   --  11.8* 12.5 12.0 14.6  ALBUMIN  --   --  3.8  --  3.7  --   CALCIUM  --    < > 8.3* 8.4* 8.3*  --   PHOS 6.0*  --   --   --   --   --   CREATININE  --    < > 6.89* 4.95* 7.14*  --   K  --    < > 5.3* 4.9 4.8 4.3   < > = values in this interval not displayed.    No results for input(s): "IRON", "TIBC", "FERRITIN" in the last 168 hours. Inpatient medications:  Chlorhexidine Gluconate Cloth  6 each Topical Daily   Chlorhexidine Gluconate Cloth  6 each Topical Q0600   Chlorhexidine Gluconate Cloth  6 each Topical Q0600   cinacalcet  30 mg Oral Q M,W,F-HD   dextrose       docusate  100 mg Per Tube BID   doxercalciferol  4 mcg Intravenous Q M,W,F-HD   EPINEPHrine       famotidine  10 mg Per Tube Daily   ipratropium-albuterol  3 mL Nebulization Q6H   [START ON 10/12/2022] levothyroxine  62.5 mcg Intravenous Daily   methylPREDNISolone (SOLU-MEDROL) injection  40 mg Intravenous Q24H   multivitamin  1 tablet Oral QHS   polyethylene glycol  17 g Per Tube Daily    sodium chloride     dextrose 5 % and 0.9 % NaCl     norepinephrine (LEVOPHED) Adult infusion 10 mcg/min (10/06/22 1556)   dextrose, docusate sodium, EPINEPHrine, fentaNYL (SUBLIMAZE) injection, fentaNYL (SUBLIMAZE) injection, mineral oil-hydrophilic petrolatum, mouth rinse

## 2022-10-06 NOTE — Anesthesia Procedure Notes (Signed)
Procedure Name: Intubation Date/Time: 10/06/2022 3:01 PM  Performed by: Lelon Perla, CRNAPre-anesthesia Checklist: Patient identified, Emergency Drugs available, Suction available and Patient being monitored Patient Re-evaluated:Patient Re-evaluated prior to induction Oxygen Delivery Method: Circle system utilized Preoxygenation: Pre-oxygenation with 100% oxygen Induction Type: Rapid sequence Ventilation: Mask ventilation without difficulty Laryngoscope Size: Glidescope and 3 Grade View: Grade II Tube type: Oral Tube size: 7.0 mm Number of attempts: 1 Airway Equipment and Method: Oral airway, Rigid stylet and Video-laryngoscopy Placement Confirmation: ETT inserted through vocal cords under direct vision, positive ETCO2 and breath sounds checked- equal and bilateral Secured at: 22 cm Tube secured with: Tape Dental Injury: Teeth and Oropharynx as per pre-operative assessment  Comments: RT attempted DL x1 resulting in esophageal intubation. CRNA successful with Glidescope S3.

## 2022-10-06 NOTE — Progress Notes (Signed)
Pharmacy Antibiotic Note  Morgan Clay is a 77 y.o. female admitted on 10/02/2022 with  aspiration PNA .  Pharmacy has been consulted for Zosyn dosing. ESRD on HD MWF.  Plan: Zosyn 3.375g IV ( infusion) x1; then 2.25g IV q8h Monitor clinical progress, c/s F/u de-escalation plan/LOT   Weight: 55.5 kg (122 lb 5.7 oz)  Temp (24hrs), Avg:97.5 F (36.4 C), Min:96.7 F (35.9 C), Max:97.8 F (36.6 C)  Recent Labs  Lab 10/02/22 1616 10/02/22 1626 10/02/22 1813 10/03/22 0017 10/03/22 1440 10/03/22 1935 10/04/22 0058 10/05/22 0315 10/06/22 0229  WBC 14.2*  --   --   --  12.1*  --  10.5 8.7 11.9*  CREATININE 3.69*  --   --   --  5.93* 6.45* 6.89* 4.95* 7.14*  LATICACIDVEN  --  7.7* 7.6* 8.9* 4.7*  --  2.2*  --   --     Estimated Creatinine Clearance: 5.2 mL/min (A) (by C-G formula based on SCr of 7.14 mg/dL (H)).    No Known Allergies   Leia Alf, PharmD, BCPS Please check AMION for all [facility] Pharmacy contact numbers Clinical Pharmacist 10/06/2022 4:32 PM

## 2022-10-06 NOTE — Progress Notes (Signed)
OT Cancellation Note  Patient Details Name: Morgan Clay MRN: [id number] DOB: 02/23/1946   Cancelled Treatment:    Reason Eval/Treat Not Completed: Patient at procedure or test/ unavailable (Pt is off unit for HD. OT will attempt to see pt for skilled OT eval later this day as pt is appropriate and as schedule allows.)  [name] "Ronaldo Miyamoto" M., OTR/L, MA Acute Rehab [phone]   Lendon Colonel 10/06/2022, 8:58 AM

## 2022-10-06 NOTE — Transfer of Care (Signed)
Immediate Anesthesia Transfer of Care Note  Patient: Morgan Clay  Procedure(s) Performed: AN AD HOC INTUBATION  Patient Location: Nursing Unit  Anesthesia Type:General  Level of Consciousness: Patient remains intubated per anesthesia plan  Airway & Oxygen Therapy: Patient remains intubated per anesthesia plan  Post-op Assessment: Post -op Vital signs reviewed and unstable, Anesthesiologist notified  Post vital signs: Reviewed and stable  Last Vitals: SEE UNIT VITALS Vitals Value Taken Time  BP    Temp    Pulse    Resp    SpO2      Last Pain:  Vitals:   10/06/22 1305  TempSrc:   PainSc: 0-No pain      Patients Stated Pain Goal: 0 (10/03/22 1200)  Complications: No notable events documented.

## 2022-10-06 NOTE — Plan of Care (Signed)
  Problem: Clinical Measurements: Goal: Will remain free from infection Outcome: Progressing Goal: Diagnostic test results will improve Outcome: Progressing Goal: Respiratory complications will improve Outcome: Progressing Goal: Cardiovascular complication will be avoided Outcome: Progressing   Problem: Activity: Goal: Risk for activity intolerance will decrease Outcome: Progressing   Problem: Coping: Goal: Level of anxiety will decrease Outcome: Progressing   Problem: Elimination: Goal: Will not experience complications related to bowel motility Outcome: Progressing Goal: Will not experience complications related to urinary retention Outcome: Progressing   Problem: Pain Managment: Goal: General experience of comfort will improve Outcome: Progressing   Problem: Safety: Goal: Ability to remain free from injury will improve Outcome: Progressing   Problem: Skin Integrity: Goal: Risk for impaired skin integrity will decrease Outcome: Progressing   

## 2022-10-06 NOTE — Progress Notes (Signed)
PT Cancellation Note  Patient Details Name: Morgan Clay MRN: [id number] DOB: [birth year]   Cancelled Treatment:    Reason Eval/Treat Not Completed: Patient at procedure or test/unavailable (pt off unit at HD. will follow up at later date/time as pt able and schedule allows.)   Renaldo Fiddler PT, DPT Acute Rehabilitation Services Office [phone]  10/06/22 12:22 PM

## 2022-10-06 NOTE — Progress Notes (Signed)
Pt arrived to unit with two rings. Rings removed and given to patients daughter.

## 2022-10-06 NOTE — Consult Note (Signed)
   [facility] CM Inpatient Consult   10/06/2022  Ryelyn Hench 10/03/1945 [id number]  [facility] [[facility]]  Accountable Care Organization [ACO] Patient: Morgan Clay  On unit rounds patient is on droplet precautions  Primary Care Provider:  Esperanza Richters, PA-C with [place]    Patient screened for hospitalization with noted extreme high risk score for unplanned readmission risk to assess for potential [facility]  [[facility]] Care Management service needs for post hospital transition for care coordination.  Review of patient's electronic medical record reveals patient is was in HD   Plan:  Continue to follow progress and disposition to assess for post hospital community care coordination/management needs.  Referral request for community care coordination: will continue to follow for progress and community care coordination needs.  Of note, [facility] Care Management/Population Health does not replace or interfere with any arrangements made by the Inpatient Transition of Care team.  For questions contact:   Charlesetta Shanks, RN BSN CCM [facility]  [phone] business mobile phone Toll free office [phone]  *Concierge Line  [phone] Fax number: [phone] [email or web address]

## 2022-10-06 NOTE — Code Documentation (Signed)
  Patient Name: Morgan Clay   MRN: [id number]   Date of Birth/ Sex: 10-22-1945 , female      Admission Date: 10/02/2022  Attending Provider: Jerald Kief, MD  Primary Diagnosis: Acute hypoxemic respiratory failure [facility])   Indication: Pt was in her usual state of health until this PM, when she was noted to be unresponsive. Code blue was subsequently called. At the time of arrival on scene, ACLS protocol was underway.   Technical Description:  - CPR performance duration:  5 minutes  - Was defibrillation or cardioversion used? No   - Was external pacer placed? Yes  - Was patient intubated pre/post CPR? Yes   Medications Administered: Y = Yes; Blank = No Amiodarone    Atropine    Calcium    Epinephrine  Y  Lidocaine    Magnesium    Norepinephrine    Phenylephrine    Sodium bicarbonate    Vasopressin     Post CPR evaluation:  - Final Status - Was patient successfully resuscitated ? Yes - What is current rhythm? NSR with PVCs - What is current hemodynamic status? Critical but stable  Miscellaneous Information:  - Labs sent, including: CBC, CMP, Troponin, magnesium, lactic acid, abd, phosphorus, cxr  - Primary team notified?  Yes  - Family Notified? Yes  - Additional notes/ transfer status: Transferred to 70M     Rocky Morel, DO  10/06/2022, 3:21 PM

## 2022-10-06 NOTE — Consult Note (Addendum)
NAME:  Morgan Clay, MRN:  [id number], DOB:  1946-01-31, LOS: 4 ADMISSION DATE:  10/02/2022, CONSULTATION DATE:  10/02/22 REFERRING MD:  Ernie Avena, MD CHIEF COMPLAINT:  Respiratory Failure  History of Present Illness:  Morgan Clay is a 77 year old woman, never smoker with history of heart failure, pulmonary hypertension, asthma, ESRD on HD, cirrhosis secondary to hepatitis C s/p Harvoni treatment, atrial fibrillation and esophageal varices/small bowel AVMs who presented to [facility] with shortness of breath after dialysis, she was tachypneic with an SpO2 on arrival in the ER.   She was noted to have Lactic acid of 7.7 and was given a bolus with repeat lactic acid 7.6. Due to increase work of breathing she was placed on non-rebreather mask. She was given duoneb and solumedrol 125mg  IV. CTA Chest was negative for PE, there was motion artifact but mosaic attenuation of the pulmonary parenchyma. CT Neck showed possible mild pharyngeal mucosal swelling, but again difficult due to motion artifact. Neck CT was done for neck swelling. CT abdomen done for abdominal pain with no acute findings.   PCCM consulted as patient remained tachypneic with increased work of breathing after transfer from non-rebreather to [facility].   Patient accepted in transfer from [facility] to [facility] ICU.  Pertinent  Medical History   Asthma Pulmonary Hypertension OSA Chronic Systolic Heart Failure Atrial Fibrillation DMII ESRD on HD Cirrhosis Hepatitis C s/p Harvoni Esophageal Varices Duodenal AVM  Significant Hospital Events: Including procedures, antibiotic start and stop dates in addition to other pertinent events   5/27 presented to [facility], transferred to [facility] 5/28 remains on 12L, stable, viral panel + for rhinovirus/Enterovirus  5/29 lactic improving, surgery following serial abdominal exams, down to 4L [place].  5/30 down to room air. Initial resp failure felt  2/2 rhinovirus pneumonitis. Lactate cleared felt 2/2 generalized multiorgan failure which was slow to clear d/t cirrhosis. Remained encephalopathic  5/31 surgery cleared for diet and signed off. Went to [employer] [facility]. Completed HD was found by family unresponsive. Not initially on tele. Staff called to room. She had no pulse. Rhythm PEA. Underwent 5 minutes of ACLS 1 round epi before ROSC. Did not regain LOC so intubated and transferred to ICU. Copious oral secretions noted during intubation   Interim History / Subjective:  Arrived in ICU s/p arrest  Objective   Blood pressure (Abnormal) 150/60, pulse 81, temperature 97.8 F (36.6 C), resp. rate 20, weight 55.5 kg, SpO2 99 %.        Intake/Output Summary (Last 24 hours) at 10/06/2022 1522 Last data filed at 10/06/2022 1302 Gross per 24 hour  Intake 71.58 ml  Output 1000 ml  Net -928.42 ml   Filed Weights   10/05/22 0414 10/06/22 0522 10/06/22 0900  Weight: 55.7 kg 55.5 kg 55.5 kg   General chronically ill appearing 77 year old female unresponsive on vent s/p arrest HENT PERRL orally intubated Pulm diffuse coarse rhonchi. Asymmetric chest rise noted  Card rrr w/ wide QRs Abd soft Ext warm  Neuro unresponsive   Resolved Hospital Problem list   Elevated trop   Assessment & Plan:  PEA cardiac arrest. Time to ROSC 5 minutes.  Etiology not clear. Has underlying severe PH. ? Hypovolemia s/p iHD ? Primary cardiac event (had recent viral process? New CM) ? Simply aspirations. Was also hypoglycemic  Plan  12 lead Cycle CEs Tele Repeat echo   Circulatory Shock s/p cardiac arrest w/ known Pulmonary Hypertension,  LVH,  HFrEF (40-45%), right heart failure and aortic route insuff. H/o ICD Plan Keep euvolemic Correct acidosis  Hold antihypertensives NE gtt for MAP > 65 Would benefit from CVP monitoring, and assessment of Co-ox if pressor needs persist   Acute hypoxic Respiratory Failure s/p PEA arrest c/b aspiration PNA and possible flail chest  s/p CPR 5/31. superimposed on recent  viral pneumonia and asthma exacerbation and h/o OSA and PH.  Had been weaned to room air.  Plan Full vent support Cxr Bronch for airway inspection  Ck ABG  Change to zosyn  Resp culture  Scheduled BDs Cont systemic steroids  PAD protocol RASS goal -1 VAP bundle   Lactic acidosis  -slow to clear. Felt 2/2 multiorgan dysfxn/and cirrhosis not clearing Plan Supportive care F/u am lactate  Acute metabolic encephalopathy now w/ concern for anoxic brain injury s/p arrest has had on-going delirium,  Was confused 5/31 not following commands CT head was neg. Ammonia was nml  Plan PAD protocol  Supportive care   DM w/ Hypoglycemia Plan Getting amp d50  Add D5NS at 50cc hr MIVFs  Abdominal Pain Cirrhosis Elevated Liver Enzymes - initial CTA without acute pathology - Liver enzymes elevated in setting of hepatic congestion due to Right heart failure and hypoxemia Plan NPO  Resume TFs  Atrial fib  - off coreg and bidil per Heart Failure team from an earlier admission due to hypotension Plan Tele  Holding amiodarone 2/2 increased LFTs Hold AC s/p arrest. Need to be sure no evidence of bleeding. Looks like flail chest    ESRD on HD Plan Nephro following Repeat labs now W/ new hypotension will need to think about CRRT if needs dialysis   Anemia of Chronic Disease Plan Cbc Transfuse for hgb < 7  Holding [facility] for now     Best Practice (right click and "Reselect all SmartList Selections" daily)   Diet/type: NPO w/ meds via tube DVT prophylaxis: SCD GI prophylaxis: N/A Lines: N/A Foley:  N/A Code Status:  full code Last date of multidisciplinary goals of care discussion [discussed with patient at bedside 5/27 - wishes to be full code] pt updated at the beside, will update daughter   Labs   CBC: Recent Labs  Lab 09/30/22 1515 10/02/22 1616 10/02/22 1740 10/03/22 1440 10/04/22 0058 10/05/22 0315 10/06/22 0229  WBC 5.0  14.2*  --  12.1* 10.5 8.7 11.9*  NEUTROABS 3.7 12.2*  --   --   --  8.4*  --   HGB 11.9* 12.5 15.0 12.9 11.8* 12.5 12.0  HCT 37.6 38.6 44.0 39.1 36.0 38.8 36.6  MCV 90.0 87.7  --  86.5 87.2 88.2 86.9  PLT 140* 147*  --  139* 129* 123* 142*    Basic Metabolic Panel: Recent Labs  Lab 10/02/22 1616 10/02/22 1740 10/03/22 1440 10/03/22 1617 10/03/22 1935 10/04/22 0058 10/05/22 0315 10/06/22 0229  NA 134*   < > 129*  --  129* 128* 135 138  K 4.1   < > 6.3*  --  4.6 5.3* 4.9 4.8  CL 90*  --  84*  --  86* 85* 93* 90*  CO2 25  --  21*  --  22 20* 20* 21*  GLUCOSE 87  --  112*  --  84 117* 122* 159*  BUN 15  --  32*  --  38* 46* 34* 64*  CREATININE 3.69*  --  5.93*  --  6.45* 6.89* 4.95* 7.14*  CALCIUM 7.7*  --  8.1*  --  8.6* 8.3* 8.4* 8.3*  MG 2.2  --   --   --   --  3.0* 2.5*  --   PHOS  --   --   --  6.0*  --   --   --   --    < > = values in this interval not displayed.   GFR: Estimated Creatinine Clearance: 5.2 mL/min (A) (by C-G formula based on SCr of 7.14 mg/dL (H)). Recent Labs  Lab 10/02/22 1813 10/03/22 0017 10/03/22 1333 10/03/22 1440 10/04/22 0058 10/05/22 0315 10/06/22 0229  PROCALCITON  --   --  9.98  --   --   --   --   WBC  --   --   --  12.1* 10.5 8.7 11.9*  LATICACIDVEN 7.6* 8.9*  --  4.7* 2.2*  --   --     Liver Function Tests: Recent Labs  Lab 09/30/22 1515 10/02/22 1616 10/03/22 0017 10/03/22 1440 10/04/22 0058 10/06/22 0229  AST 31 1,308*  --  1,499* 1,079* 314*  ALT 13 681*  --  962* 887* 633*  ALKPHOS 85 102 96 114 102 122  BILITOT 0.8 3.1*  --  2.7* 2.8* 3.1*  PROT 9.1* 9.3*  --  8.6* 8.2* 8.2*  ALBUMIN 4.2 4.3  --  4.0 3.8 3.7   Recent Labs  Lab 10/03/22 0017  LIPASE 34  AMYLASE 1,215*   Recent Labs  Lab 10/06/22 0229  AMMONIA 25    ABG    Component Value Date/Time   PHART 7.46 (H) 10/05/2022 1817   PCO2ART 33 10/05/2022 1817   PO2ART 60 (L) 10/05/2022 1817   HCO3 23.5 10/05/2022 1817   TCO2 26 10/02/2022 1740    ACIDBASEDEF 1.0 10/02/2022 1740   O2SAT 91 10/05/2022 1817     Coagulation Profile: Recent Labs  Lab 10/03/22 1333  INR 2.5*    Cardiac Enzymes: No results for input(s): "CKTOTAL", "CKMB", "CKMBINDEX", "TROPONINI" in the last 168 hours.  HbA1C: Hgb A1c MFr Bld  Date/Time Value Ref Range Status  09/05/2022 02:44 PM 5.5 4.6 - 6.5 % Final    Comment:    Glycemic Control Guidelines for People with Diabetes:Non Diabetic:  <6%Goal of Therapy: <7%Additional Action Suggested:  >8%   10/06/2021 11:59 AM 4.9 4.6 - 6.5 % Final    Comment:    Glycemic Control Guidelines for People with Diabetes:Non Diabetic:  <6%Goal of Therapy: <7%Additional Action Suggested:  >8%     CBG: Recent Labs  Lab 10/04/22 2303 10/05/22 2116 10/06/22 0021 10/06/22 0522 10/06/22 0804  GLUCAP 117* 140* 139* 127* 144*    Review of Systems:   A 10 point review of systems was performed and is negative unless stated in HPI  Past Medical History:  She,  has a past medical history of Anemia, Angiectasia, Angiodysplasia of small intestine (06/10/2020), Aortic atherosclerosis (HCC) (08/30/2017), Arthritis, Asthma, Biventricular ICD (implantable cardioverter-defibrillator) in place (01/08/2007), 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) (09/24/2020), Chronic obstructive pulmonary disease (HCC), Chronic systolic congestive heart failure (HCC), 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 (HCV) (HCC), Diabetes (HCC), Diverticulitis of left colon status post robotic low anterior to sigmoid resection 05/22/2018 (07/31/2017), Duodenal arteriovenous malformation, End stage renal disease (HCC) (03/22/2021), Esophageal varices (HCC), Glaucoma, Gout, History of blood transfusion (~ 11/2017), Hypertension associated with diabetes (HCC) (06/07/2017), Hypothyroidism, Internal hemorrhoids, LBBB (left bundle branch block), Malnutrition of moderate degree (07/09/2020), MDD (major depressive  disorder), recurrent, in partial remission (HCC) (06/07/2017), OBSTRUCTIVE SLEEP  APNEA (11/14/2007), OSTEOPENIA (09/30/2008), Persistent atrial fibrillation (HCC) (02/06/2020), Polyneuropathy associated with underlying disease (HCC) (06/09/2021), Pulmonary hypertension (HCC) on echocardiogram (01/14/2018), Secondary esophageal varices without bleeding (HCC) (12/23/2020), Secondary hypercoagulable state (HCC) (02/06/2020), and Type 2 diabetes, controlled, with renal manifestation (HCC) (11/24/2013).   Surgical History:   Past Surgical History:  Procedure Laterality Date   A/V FISTULAGRAM N/A 03/28/2021   Procedure: A/V FISTULAGRAM;  Surgeon: Maeola Harman, MD;  Location: [facility] INVASIVE CV LAB;  Service: Cardiovascular;  Laterality: N/A;   APPENDECTOMY     AV FISTULA PLACEMENT Left 08/05/2020   Procedure: LEFT UPPER EXTREMITY ARTERIOVENOUS (AV) FISTULA CREATION;  Surgeon: Leonie Douglas, MD;  Location: [facility] OR;  Service: Vascular;  Laterality: Left;   BALLOON ENTEROSCOPY N/A 06/21/2022   Procedure: BALLOON ENTEROSCOPY;  Surgeon: Shellia Cleverly, DO;  Location: [facility] ENDOSCOPY;  Service: Gastroenterology;  Laterality: N/A;   BASCILIC VEIN TRANSPOSITION Left 09/16/2020   Procedure: LEFT SECOND STAGE BASCILIC VEIN TRANSPOSITION;  Surgeon: Leonie Douglas, MD;  Location: [facility] OR;  Service: Vascular;  Laterality: Left;  PERIPHERAL NERVE BLOCK   CARDIAC CATHETERIZATION     CARDIOVERSION N/A 12/14/2014   Procedure: CARDIOVERSION;  Surgeon: Lars Masson, MD;  Location: [facility] ENDOSCOPY;  Service: Cardiovascular;  Laterality: N/A;   CARDIOVERSION N/A 02/26/2017   Procedure: CARDIOVERSION;  Surgeon: Marinus Maw, MD;  Location: [facility] INVASIVE CV LAB;  Service: Cardiovascular;  Laterality: N/A;   CARDIOVERSION N/A 07/24/2017   Procedure: CARDIOVERSION;  Surgeon: Elease Hashimoto Deloris Ping, MD;  Location: [facility] ENDOSCOPY;  Service: Cardiovascular;  Laterality: N/A;   CARDIOVERSION N/A 02/12/2020   Procedure:  CARDIOVERSION;  Surgeon: Laurey Morale, MD;  Location: [facility] ENDOSCOPY;  Service: Cardiovascular;  Laterality: N/A;   CARDIOVERSION N/A 09/15/2021   Procedure: CARDIOVERSION;  Surgeon: Laurey Morale, MD;  Location: [facility] ENDOSCOPY;  Service: Cardiovascular;  Laterality: N/A;   CARPAL TUNNEL RELEASE  07/21/2021   COLONOSCOPY N/A 11/08/2017   Procedure: COLONOSCOPY;  Surgeon: Rachael Fee, MD;  Location: [facility] ENDOSCOPY;  Service: Endoscopy;  Laterality: N/A;   COLONOSCOPY WITH PROPOFOL N/A 05/31/2020   Procedure: COLONOSCOPY WITH PROPOFOL;  Surgeon: Hilarie Fredrickson, MD;  Location: [facility] ENDOSCOPY;  Service: Endoscopy;  Laterality: N/A;   COLONOSCOPY WITH PROPOFOL N/A 10/26/2021   Procedure: COLONOSCOPY WITH PROPOFOL;  Surgeon: Imogene Burn, MD;  Location: [facility] ENDOSCOPY;  Service: Gastroenterology;  Laterality: N/A;   DILATION AND CURETTAGE OF UTERUS     ENTEROSCOPY N/A 03/02/2020   Procedure: ENTEROSCOPY;  Surgeon: Benancio Deeds, MD;  Location: [facility] ENDOSCOPY;  Service: Gastroenterology;  Laterality: N/A;   ENTEROSCOPY N/A 06/08/2022   Procedure: ENTEROSCOPY;  Surgeon: Beverley Fiedler, MD;  Location: [facility] ENDOSCOPY;  Service: Gastroenterology;  Laterality: N/A;   EP IMPLANTABLE DEVICE N/A 10/06/2015   Procedure: BIV ICD Generator Changeout;  Surgeon: Duke Salvia, MD;  Location: [facility] INVASIVE CV LAB;  Service: Cardiovascular;  Laterality: N/A;   ESOPHAGOGASTRODUODENOSCOPY N/A 10/26/2017   Procedure: ESOPHAGOGASTRODUODENOSCOPY (EGD);  Surgeon: Meryl Dare, MD;  Location: [facility] ENDOSCOPY;  Service: Endoscopy;  Laterality: N/A;   ESOPHAGOGASTRODUODENOSCOPY (EGD) WITH PROPOFOL N/A 11/07/2017   Procedure: ESOPHAGOGASTRODUODENOSCOPY (EGD) WITH PROPOFOL;  Surgeon: Rachael Fee, MD;  Location: [facility] ENDOSCOPY;  Service: Endoscopy;  Laterality: N/A;   ESOPHAGOGASTRODUODENOSCOPY (EGD) WITH PROPOFOL N/A 05/29/2020   Procedure: ESOPHAGOGASTRODUODENOSCOPY (EGD) WITH PROPOFOL;  Surgeon: Sherrilyn Rist, MD;   Location: [facility] ENDOSCOPY;  Service: Gastroenterology;  Laterality: N/A;   ESOPHAGOGASTRODUODENOSCOPY (EGD) WITH PROPOFOL N/A 10/26/2021  Procedure: ESOPHAGOGASTRODUODENOSCOPY (EGD) WITH PROPOFOL;  Surgeon: Imogene Burn, MD;  Location: [facility] ENDOSCOPY;  Service: Gastroenterology;  Laterality: N/A;   ESOPHAGOGASTRODUODENOSCOPY (EGD) WITH PROPOFOL N/A 03/08/2022   Procedure: ESOPHAGOGASTRODUODENOSCOPY (EGD) WITH PROPOFOL;  Surgeon: Lynann Bologna, MD;  Location: [facility] ENDOSCOPY;  Service: Gastroenterology;  Laterality: N/A;   GIVENS CAPSULE STUDY N/A 05/19/2020   Procedure: GIVENS CAPSULE STUDY;  Surgeon: Iva Boop, MD;  Location: [facility] ENDOSCOPY;  Service: Endoscopy;  Laterality: N/A;  .adm for obs since pacemaker, PA wil enter order and see pt   GIVENS CAPSULE STUDY N/A 05/29/2020   Procedure: GIVENS CAPSULE STUDY;  Surgeon: Sherrilyn Rist, MD;  Location: [facility] ENDOSCOPY;  Service: Gastroenterology;  Laterality: N/A;   GIVENS CAPSULE STUDY N/A 06/08/2022   Procedure: GIVENS CAPSULE STUDY;  Surgeon: Beverley Fiedler, MD;  Location: [facility] ENDOSCOPY;  Service: Gastroenterology;  Laterality: N/A;   HEMATOMA EVACUATION Left 09/24/2020   Procedure: EVACUATION HEMATOMA ARM;  Surgeon: Larina Earthly, MD;  Location: [facility] OR;  Service: Vascular;  Laterality: Left;   HEMOSTASIS CLIP PLACEMENT  06/21/2022   Procedure: HEMOSTASIS CLIP PLACEMENT;  Surgeon: Shellia Cleverly, DO;  Location: [facility] ENDOSCOPY;  Service: Gastroenterology;;   HOT HEMOSTASIS N/A 10/26/2017   Procedure: HOT HEMOSTASIS (ARGON PLASMA COAGULATION/BICAP);  Surgeon: Meryl Dare, MD;  Location: [facility] ENDOSCOPY;  Service: Endoscopy;  Laterality: N/A;   HOT HEMOSTASIS N/A 03/02/2020   Procedure: HOT HEMOSTASIS (ARGON PLASMA COAGULATION/BICAP);  Surgeon: Benancio Deeds, MD;  Location: [facility] ENDOSCOPY;  Service: Gastroenterology;  Laterality: N/A;   HOT HEMOSTASIS N/A 05/31/2020   Procedure: HOT HEMOSTASIS (ARGON PLASMA COAGULATION/BICAP);  Surgeon: Hilarie Fredrickson, MD;  Location: [facility] ENDOSCOPY;  Service: Endoscopy;  Laterality: N/A;   HOT HEMOSTASIS N/A 03/08/2022   Procedure: HOT HEMOSTASIS (ARGON PLASMA COAGULATION/BICAP);  Surgeon: Lynann Bologna, MD;  Location: [facility] ENDOSCOPY;  Service: Gastroenterology;  Laterality: N/A;   HOT HEMOSTASIS N/A 06/08/2022   Procedure: HOT HEMOSTASIS (ARGON PLASMA COAGULATION/BICAP);  Surgeon: Beverley Fiedler, MD;  Location: [facility] ENDOSCOPY;  Service: Gastroenterology;  Laterality: N/A;   HOT HEMOSTASIS N/A 06/21/2022   Procedure: HOT HEMOSTASIS (ARGON PLASMA COAGULATION/BICAP);  Surgeon: Shellia Cleverly, DO;  Location: [facility] ENDOSCOPY;  Service: Gastroenterology;  Laterality: N/A;   ICD GENERATOR CHANGEOUT N/A 07/21/2022   Procedure: ICD GENERATOR CHANGEOUT;  Surgeon: Duke Salvia, MD;  Location: [facility] INVASIVE CV LAB;  Service: Cardiovascular;  Laterality: N/A;   INSERT / REPLACE / REMOVE PACEMAKER  ?2008   IR PERC TUN PERIT CATH WO PORT S&I /IMAG  07/05/2020   PERIPHERAL VASCULAR INTERVENTION Left 03/28/2021   Procedure: PERIPHERAL VASCULAR INTERVENTION;  Surgeon: Maeola Harman, MD;  Location: [facility] INVASIVE CV LAB;  Service: Cardiovascular;  Laterality: Left;   POLYPECTOMY  11/08/2017   Procedure: POLYPECTOMY;  Surgeon: Rachael Fee, MD;  Location: [facility] ENDOSCOPY;  Service: Endoscopy;;   PROCTOSCOPY N/A 05/22/2018   Procedure: RIGID PROCTOSCOPY;  Surgeon: Karie Soda, MD;  Location: [facility] ORS;  Service: General;  Laterality: N/A;   RIGHT HEART CATH N/A 02/13/2018   Procedure: RIGHT HEART CATH;  Surgeon: Laurey Morale, MD;  Location: [facility] INVASIVE CV LAB;  Service: Cardiovascular;  Laterality: N/A;   RIGHT HEART CATH N/A 06/23/2020   Procedure: RIGHT HEART CATH;  Surgeon: Laurey Morale, MD;  Location: [facility] INVASIVE CV LAB;  Service: Cardiovascular;  Laterality: N/A;   RIGHT/LEFT HEART CATH AND CORONARY ANGIOGRAPHY N/A 12/11/2019   Procedure: RIGHT/LEFT HEART CATH AND CORONARY ANGIOGRAPHY;  Surgeon: Laurey Morale, MD;  Location: [facility] INVASIVE CV LAB;  Service: Cardiovascular;  Laterality: N/A;   SUBMUCOSAL TATTOO INJECTION  03/02/2020   Procedure: SUBMUCOSAL TATTOO INJECTION;  Surgeon: Benancio Deeds, MD;  Location: [facility] ENDOSCOPY;  Service: Gastroenterology;;   SUBMUCOSAL TATTOO INJECTION  05/31/2020   Procedure: SUBMUCOSAL TATTOO INJECTION;  Surgeon: Hilarie Fredrickson, MD;  Location: [facility] ENDOSCOPY;  Service: Endoscopy;;   SUBMUCOSAL TATTOO INJECTION  06/21/2022   Procedure: SUBMUCOSAL TATTOO INJECTION;  Surgeon: Shellia Cleverly, DO;  Location: [facility] ENDOSCOPY;  Service: Gastroenterology;;   TEE WITHOUT CARDIOVERSION N/A 08/02/2021   Procedure: TRANSESOPHAGEAL ECHOCARDIOGRAM (TEE);  Surgeon: Laurey Morale, MD;  Location: [facility] ENDOSCOPY;  Service: Cardiovascular;  Laterality: N/A;   TUBAL LIGATION     XI ROBOTIC ASSISTED LOWER ANTERIOR RESECTION N/A 05/22/2018   Procedure: XI ROBOTIC ASSISTED SIGMOID COLOECTOMY MOBILIZATION OF SPLENIC FLEXURE, FIREFLY ASSESSMENT OF PERFUSION;  Surgeon: Karie Soda, MD;  Location: [facility] ORS;  Service: General;  Laterality: N/A;  ERAS PATHWAY     Social History:   reports that she has never smoked. She has never used smokeless tobacco. She reports that she does not currently use alcohol. She reports that she does not currently use drugs after having used the following drugs: Marijuana.   Family History:  Her family history includes Asthma in her father and sister; Heart attack in her father and mother; Lung cancer in her sister; Stroke in her brother. There is no history of Colon cancer, Esophageal cancer, Pancreatic cancer, Stomach cancer, or Liver disease.   Allergies No Known Allergies   Home Medications  Prior to Admission medications   Medication Sig Start Date End Date Taking? Authorizing Provider  albuterol (VENTOLIN HFA) 108 (90 Base) MCG/ACT inhaler Inhale 2 puffs into the lungs every 6 (six) hours as needed for wheezing or shortness of breath. 04/07/22    Hunsucker, Lesia Sago, MD  amiodarone (PACERONE) 200 MG tablet Take 1 tablet by mouth once daily 08/30/22   Saguier, Ramon Dredge, PA-C  ammonium lactate (LAC-HYDRIN) 12 % lotion Apply daily on forearms. 09/05/22   Sharlene Dory, DO  amoxicillin-clavulanate (AUGMENTIN) 875-125 MG tablet Take 1 tablet by mouth 2 (two) times daily. 09/28/22   Clayborne [name], NP  apixaban (ELIQUIS) 2.5 MG TABS tablet Take 1 tablet (2.5 mg total) by mouth 2 (two) times daily. 03/11/22   Pokhrel, Rebekah Chesterfield, MD  cyclobenzaprine (FLEXERIL) 5 MG tablet Take 5 mg by mouth at bedtime as needed for muscle spasms. 12/07/21   [provider]  gabapentin (NEURONTIN) 300 MG capsule Take 1 capsule (300 mg total) by mouth daily. Fill one rx. Computer may have sent over 2 Patient taking differently: Take 300 mg by mouth daily. 01/10/22   Saguier, Ramon Dredge, PA-C  HYDROcodone bit-homatropine (HYCODAN) 5-1.5 MG/5ML syrup Take 5 mLs by mouth every 8 (eight) hours as needed for up to 5 days for cough. 09/28/22 10/03/22  Clayborne [name], NP  ipratropium (ATROVENT HFA) 17 MCG/ACT inhaler Inhale 2 puffs into the lungs every 6 (six) hours as needed for wheezing. 03/24/22   Saguier, Ramon Dredge, PA-C  levothyroxine (SYNTHROID) 125 MCG tablet Take 1 tablet (125 mcg total) by mouth daily before breakfast. 01/16/22   Saguier, Ramon Dredge, PA-C  lidocaine-prilocaine (EMLA) cream Apply 1 Application topically as needed.    [provider]  methylPREDNISolone (MEDROL) 4 MG tablet Standard 6 day taper 09/28/22   Hyman Hopes B, NP  nitroGLYCERIN (NITROSTAT) 0.4 MG SL tablet Place 1 tablet (  0.4 mg total) under the tongue every 5 (five) minutes as needed for chest pain. Patient not taking: Reported on 09/28/2022 10/27/21   Leroy Sea, MD  triamcinolone cream (KENALOG) 0.1 % Apply to shins twice dialy for 7 days. 09/05/22   Sharlene Dory, DO  Vitamin D, Ergocalciferol, (DRISDOL) 1.25 MG (50000 UNIT) CAPS capsule Take 1 capsule (50,000 Units total)  by mouth every 7 (seven) days. 09/06/22   Sharlene Dory, DO     Critical care time: 50 minutes   Simonne Martinet ACNP-BC [facility] Pager # [phone] OR # [phone] if no answer

## 2022-10-06 NOTE — Progress Notes (Addendum)
eLink Physician-Brief Progress Note Patient Name: Morgan Clay DOB: 10-28-1945 MRN: [id number]   Date of Service  10/06/2022  HPI/Events of Note  CBG > 300 post dextrose for hypoglycemia earlier.   eICU Interventions  Stop dextrose fluids, follow CBG closely, call back if abnormal.      Intervention Category Intermediate Interventions: Hyperglycemia - evaluation and treatment  Ranee Gosselin 10/06/2022, 9:30 PM  22;19  Pt intubated. May you order OGT so patient can get her meds please? Also a KUB to check placement.  Ordered  23:53 CBG 279. CBG coming down from 317. Want to keep monitoring?  Yes, watch for now, earlier had hypoglycemia. Goal to keep < 180. Avoid hypoglycemia.   05:01 ABG reviewed. No intervention needed.  S/p PEA cardiac arrest likely in the setting of hypoxia due to aspiration Acute hypoxic/hypercapnic respiratory failure Small right-sided pneumothorax Mixed metabolic and respiratory acidosis- improved. Ph normal.   Ionized calcium low. - replace calcium.   05:54 Trops increased to 6,527 @ 2017. Did you want follow up this AM?  ordered

## 2022-10-07 ENCOUNTER — Inpatient Hospital Stay (HOSPITAL_COMMUNITY): Payer: 59

## 2022-10-07 ENCOUNTER — Other Ambulatory Visit (HOSPITAL_COMMUNITY): Payer: 59

## 2022-10-07 DIAGNOSIS — J9601 Acute respiratory failure with hypoxia: Secondary | ICD-10-CM | POA: Diagnosis not present

## 2022-10-07 DIAGNOSIS — I469 Cardiac arrest, cause unspecified: Secondary | ICD-10-CM

## 2022-10-07 LAB — GLUCOSE, CAPILLARY
Glucose-Capillary: 146 mg/dL — ABNORMAL HIGH (ref 70–99)
Glucose-Capillary: 147 mg/dL — ABNORMAL HIGH (ref 70–99)
Glucose-Capillary: 148 mg/dL — ABNORMAL HIGH (ref 70–99)
Glucose-Capillary: 171 mg/dL — ABNORMAL HIGH (ref 70–99)
Glucose-Capillary: 172 mg/dL — ABNORMAL HIGH (ref 70–99)
Glucose-Capillary: 174 mg/dL — ABNORMAL HIGH (ref 70–99)

## 2022-10-07 LAB — PHOSPHORUS
Phosphorus: 6.9 mg/dL — ABNORMAL HIGH (ref 2.5–4.6)
Phosphorus: 5.9 mg/dL — ABNORMAL HIGH (ref 2.5–4.6)

## 2022-10-07 LAB — POCT I-STAT 7, (LYTES, BLD GAS, ICA,H+H)
Acid-Base Excess: 1 mmol/L (ref 0.0–2.0)
Bicarbonate: 26.8 mmol/L (ref 20.0–28.0)
Calcium, Ion: 0.86 mmol/L — CL (ref 1.15–1.40)
HCT: 44 % (ref 36.0–46.0)
Hemoglobin: 15 g/dL (ref 12.0–15.0)
O2 Saturation: 99 %
Patient temperature: 99.2
Potassium: 4.4 mmol/L (ref 3.5–5.1)
Sodium: 137 mmol/L (ref 135–145)
TCO2: 28 mmol/L (ref 22–32)
pCO2 arterial: 44.9 mmHg (ref 32–48)
pH, Arterial: 7.386 (ref 7.35–7.45)
pO2, Arterial: 141 mmHg — ABNORMAL HIGH (ref 83–108)

## 2022-10-07 LAB — CBC
HCT: 39.7 % (ref 36.0–46.0)
HCT: 40.7 % (ref 36.0–46.0)
Hemoglobin: 12.6 g/dL (ref 12.0–15.0)
Hemoglobin: 13 g/dL (ref 12.0–15.0)
MCH: 29 pg (ref 26.0–34.0)
MCH: 28.8 pg (ref 26.0–34.0)
MCHC: 31.7 g/dL (ref 30.0–36.0)
MCHC: 31.9 g/dL (ref 30.0–36.0)
MCV: 91.5 fL (ref 80.0–100.0)
MCV: 90.2 fL (ref 80.0–100.0)
Platelets: 102 10*3/uL — ABNORMAL LOW (ref 150–400)
Platelets: 92 10*3/uL — ABNORMAL LOW (ref 150–400)
RBC: 4.34 MIL/uL (ref 3.87–5.11)
RBC: 4.51 MIL/uL (ref 3.87–5.11)
RDW: 24.9 % — ABNORMAL HIGH (ref 11.5–15.5)
RDW: 25.2 % — ABNORMAL HIGH (ref 11.5–15.5)
WBC: 14.5 10*3/uL — ABNORMAL HIGH (ref 4.0–10.5)
WBC: 11.6 10*3/uL — ABNORMAL HIGH (ref 4.0–10.5)
nRBC: 8.7 % — ABNORMAL HIGH (ref 0.0–0.2)
nRBC: 12.4 % — ABNORMAL HIGH (ref 0.0–0.2)

## 2022-10-07 LAB — ECHOCARDIOGRAM LIMITED
Calc EF: 40.3 %
P 1/2 time: 270 msec
Single Plane A2C EF: 45.7 %
Single Plane A4C EF: 40.5 %
Weight: 1961.21 oz

## 2022-10-07 LAB — MAGNESIUM
Magnesium: 2.3 mg/dL (ref 1.7–2.4)
Magnesium: 2.2 mg/dL (ref 1.7–2.4)

## 2022-10-07 LAB — COMPREHENSIVE METABOLIC PANEL
AST: 366 U/L — ABNORMAL HIGH (ref 15–41)
Albumin: 3.5 g/dL (ref 3.5–5.0)
Anion gap: 27 — ABNORMAL HIGH (ref 5–15)
BUN: 37 mg/dL — ABNORMAL HIGH (ref 8–23)
GFR, Estimated: 9 mL/min — ABNORMAL LOW (ref >60)
Total Bilirubin: 4.7 mg/dL — ABNORMAL HIGH (ref 0.3–1.2)

## 2022-10-07 LAB — CULTURE, RESPIRATORY W GRAM STAIN: Culture: NO GROWTH

## 2022-10-07 LAB — TROPONIN I (HIGH SENSITIVITY): Troponin I (High Sensitivity): 12808 ng/L (ref <18)

## 2022-10-07 LAB — CULTURE, BLOOD (ROUTINE X 2)
Culture: NO GROWTH
Special Requests: ADEQUATE

## 2022-10-07 MED ORDER — RENA-VITE PO TABS
1.0000 | ORAL_TABLET | Freq: Every day | ORAL | Status: DC
  Administered 2022-10-07 – 2022-10-15 (×8): 1
  Filled 2022-10-07 (×9): qty 1

## 2022-10-07 MED ORDER — LEVOTHYROXINE SODIUM 25 MCG PO TABS: 150.0000 ug | ORAL_TABLET | Freq: Every day | ORAL | Status: DC

## 2022-10-07 MED ORDER — CALCIUM GLUCONATE-NACL 2-0.675 GM/100ML-% IV SOLN
2.0000 g | Freq: Once | INTRAVENOUS | Status: AC
  Administered 2022-10-07: 2000 mg via INTRAVENOUS
  Filled 2022-10-07: qty 100

## 2022-10-07 MED ORDER — THIAMINE HCL 100 MG/ML IJ SOLN
100.0000 mg | Freq: Every day | INTRAMUSCULAR | Status: DC
  Administered 2022-10-07: 100 mg via INTRAVENOUS
  Filled 2022-10-07: qty 2

## 2022-10-07 MED ORDER — INSULIN ASPART 100 UNIT/ML IJ SOLN
0.0000 [IU] | INTRAMUSCULAR | Status: DC
  Administered 2022-10-07 (×2): 2 [IU] via SUBCUTANEOUS
  Administered 2022-10-07 – 2022-10-08 (×3): 1 [IU] via SUBCUTANEOUS
  Administered 2022-10-08: 2 [IU] via SUBCUTANEOUS

## 2022-10-07 MED ORDER — LEVOTHYROXINE SODIUM 25 MCG PO TABS
125.0000 ug | ORAL_TABLET | Freq: Every day | ORAL | Status: DC
  Administered 2022-10-08 – 2022-10-16 (×7): 125 ug
  Filled 2022-10-07 (×7): qty 1

## 2022-10-07 MED ORDER — VITAL 1.5 CAL PO LIQD
1000.0000 mL | ORAL | Status: DC
  Administered 2022-10-07 – 2022-10-14 (×8): 1000 mL

## 2022-10-07 MED ORDER — OSMOLITE 1.5 CAL PO LIQD
1000.0000 mL | Freq: Every day | ORAL | Status: DC
  Filled 2022-10-07: qty 1000

## 2022-10-07 MED ORDER — SODIUM ZIRCONIUM CYCLOSILICATE 10 G PO PACK
10.0000 g | PACK | Freq: Once | ORAL | Status: AC
  Administered 2022-10-07: 10 g
  Filled 2022-10-07: qty 1

## 2022-10-07 MED ORDER — PROSOURCE TF20 ENFIT COMPATIBL EN LIQD
60.0000 mL | Freq: Every day | ENTERAL | Status: DC
  Administered 2022-10-07 – 2022-10-14 (×7): 60 mL
  Filled 2022-10-07 (×7): qty 60

## 2022-10-07 MED ORDER — SODIUM CHLORIDE 0.9 % IV SOLN: INTRAVENOUS | Status: DC

## 2022-10-07 MED ORDER — VITAL HIGH PROTEIN PO LIQD: 1000.0000 mL | ORAL | Status: DC

## 2022-10-07 NOTE — Progress Notes (Signed)
Nutrition Follow-up  DOCUMENTATION CODES:   Not applicable  INTERVENTION:  Initiate tube feeding via OG tube: Start Vital 1.5 at 63ml/hr and advance by 10ml q8h to a goal rate of 46ml/hr (960 ml per day) Prosource TF20 60 ml once daily  Provides 1520 kcal, 85 gm protein, 733 ml free water daily  Monitor magnesium, potassium, and phosphorus BID for at least 3 days, MD to replete as needed, as pt is at risk for refeeding syndrome given pt has been without adequate nutrition >5 days  Recommend thiamine 100mg  x5 days  NUTRITION DIAGNOSIS:   Increased nutrient needs related to chronic illness as evidenced by estimated needs. - remains applicable  GOAL:   Patient will meet greater than or equal to 90% of their needs - addressing via TF  MONITOR:   PO intake, Supplement acceptance, Labs, I & O's, Diet advancement  REASON FOR ASSESSMENT:   Consult Enteral/tube feeding initiation and management  ASSESSMENT:   77 y.o. female presented to the [facility] ED with shortness of breath. PMH includes CHF, ESRD on HD, cirrhosis with esophageal varices, COPD, gout, T2DM, and pulmonary HTN. Pt admitted with acute hypoxemic respiratory failure.  5/27 - Admitted 5/29 - transferred to floor 5/30 - diet advanced to clear liquids 5/31- found unresponsive following HD; rhythm PEA; transferred to ICU and intubated; OGT tip/side port gastric  PEA cardiac arrest likely int he setting of hypoxia d/t aspiration  Surgery signed off yesterday. Clinical course suggestive of severe multiorgan dysfunction exacerbated by recent/current viral URI. Remains high risk for abdominal surgery. Recommend supportive care for now.   Patient is currently intubated on ventilator support MV: 12.4 L/min Temp (24hrs), Avg:98.3 F (36.8 C), Min:97.4 F (36.3 C), Max:100.6 F (38.1 C)  Weight has remained stable at 55 kg since 5/30.  EDW 58.5 kg  Medications: colace, hectoral MWF, pepcid, SSI 0-9 units q4h, rena-vit,  miralax, lokelma (once)  IV drips: NaCl @ 34ml/hr, IV abx  Labs: potassium 5.2, Cl 92, BUN 37, Cr 4.65, anion gap 27, ionized Ca 0.86, Phos 6.9, alkaline phos 138, AST 366, ALT 540, GFR 9, CBG's 148-317 x24 hours  Post HD net UF (5/31) 1L I/O's: +356ml since admit  NUTRITION - FOCUSED PHYSICAL EXAM: RD working remotely. Unable to perform.   Diet Order:   Diet Order             Diet NPO time specified  Diet effective now                   EDUCATION NEEDS:   Not appropriate for education at this time  Skin:  Skin Assessment: Reviewed RN Assessment  Last BM:  5/28  Height:   Ht Readings from Last 1 Encounters:  09/30/22 4\' 11"  (1.499 m)    Weight:   Wt Readings from Last 1 Encounters:  10/07/22 55.6 kg   BMI:  Body mass index is 24.76 kg/m.  Estimated Nutritional Needs:   Kcal:  1500-1700  Protein:  75-90g  Fluid:  1L + UOP  Drusilla Kanner, RDN, LDN Clinical Nutrition

## 2022-10-07 NOTE — Progress Notes (Signed)
Reviewed repeat chest x-ray, to my eye pneumothorax with significant decrease in size.  Updated patient and family at bedside regarding clinical status.

## 2022-10-07 NOTE — Progress Notes (Signed)
Echocardiogram 2D Echocardiogram has been performed.  Warren Lacy Tyson Parkison RDCS 10/07/2022, 2:57 PM

## 2022-10-07 NOTE — Progress Notes (Signed)
Upon assessing the patients mouth to place OG tube it was noted that the patient still had a lower dentures in place. Dentured were removed and placed in pink container at patient bed side.   Beatrix Shipper

## 2022-10-07 NOTE — Progress Notes (Signed)
OT Cancellation Note  Patient Details Name: Morgan Clay MRN: [id number] DOB: 04/20/1946   Cancelled Treatment:    Reason Eval/Treat Not Completed: Patient's level of consciousness (Pt now intubated and in ICU. Per RN, hold therapies today and f/u when pt is more arousable.)  Donia Pounds 10/07/2022, 8:53 AM

## 2022-10-07 NOTE — Consult Note (Addendum)
NAME:  Morgan Clay, MRN:  [id number], DOB:  [birth year], LOS: 5 ADMISSION DATE:  10/02/2022, CONSULTATION DATE:  10/02/22 REFERRING MD:  Ernie Avena, MD CHIEF COMPLAINT:  Respiratory Failure  History of Present Illness:  Morgan Clay is a 77 year old woman, never smoker with history of heart failure, pulmonary hypertension, asthma, ESRD on HD, cirrhosis secondary to hepatitis C s/p Harvoni treatment, atrial fibrillation and esophageal varices/small bowel AVMs who presented to [facility] with shortness of breath after dialysis, she was tachypneic with an SpO2 on arrival in the ER.   She was noted to have Lactic acid of 7.7 and was given a bolus with repeat lactic acid 7.6. Due to increase work of breathing she was placed on non-rebreather mask. She was given duoneb and solumedrol 125mg  IV. CTA Chest was negative for PE, there was motion artifact but mosaic attenuation of the pulmonary parenchyma. CT Neck showed possible mild pharyngeal mucosal swelling, but again difficult due to motion artifact. Neck CT was done for neck swelling. CT abdomen done for abdominal pain with no acute findings.   PCCM consulted as patient remained tachypneic with increased work of breathing after transfer from non-rebreather to [facility].   Patient accepted in transfer from [facility] to [facility] ICU.  Pertinent  Medical History   Asthma Pulmonary Hypertension OSA Chronic Systolic Heart Failure Atrial Fibrillation DMII ESRD on HD Cirrhosis Hepatitis C s/p Harvoni Esophageal Varices Duodenal AVM  Significant Hospital Events: Including procedures, antibiotic start and stop dates in addition to other pertinent events   5/27 presented to [facility], transferred to [facility] 5/28 remains on 12L, stable, viral panel + for rhinovirus/Enterovirus  5/29 lactic improving, surgery following serial abdominal exams, down to 4L [place].  5/30 down to room air. Initial resp failure felt  2/2 rhinovirus pneumonitis. Lactate cleared felt 2/2 generalized multiorgan failure which was slow to clear d/t cirrhosis. Remained encephalopathic  5/31 surgery cleared for diet and signed off. Went to [employer]. Completed HD was found by family unresponsive. Not initially on tele. Staff called to room. She had no pulse. Rhythm PEA. Underwent 5 minutes of ACLS 1 round epi before ROSC. Did not regain LOC so intubated and transferred to ICU. Copious oral secretions noted during intubation   Interim History / Subjective:  Does not follow commands, minimal sedation.  Objective   Blood pressure (!) 114/48, pulse 87, temperature 98.5 F (36.9 C), temperature source Axillary, resp. rate (!) 34, weight 55.6 kg, SpO2 100 %.    Vent Mode: PRVC FiO2 (%):  [40 %-100 %] 40 % Set Rate:  [34 bmp] 34 bmp Vt Set:  [350 mL] 350 mL PEEP:  [8 cmH20] 8 cmH20 Plateau Pressure:  [23 cmH20-26 cmH20] 24 cmH20   Intake/Output Summary (Last 24 hours) at 10/07/2022 1237 Last data filed at 10/07/2022 1200 Gross per 24 hour  Intake 1607.61 ml  Output 1000 ml  Net 607.61 ml    Filed Weights   10/06/22 0522 10/06/22 0900 10/07/22 0452  Weight: 55.5 kg 55.5 kg 55.6 kg   General chronically ill appearing 77 year old female unresponsive on vent s/p arrest HENT PERRL orally intubated Pulm diffuse coarse rhonchi. Asymmetric chest rise noted  Card rrr w/ wide QRs Abd soft Ext warm  Neuro unresponsive   Resolved Hospital Problem list   Elevated trop   Assessment & Plan:  PEA cardiac arrest. Time to ROSC 5 minutes.  Etiology not clear. Has underlying severe PH.  Suspect element of hypovolemia. Plan  Minimize sedation Follow-up TTE  Circulatory Shock s/p cardiac arrest w/ known Pulmonary Hypertension,  LVH, HFrEF (40-45%), right heart failure and aortic route insuff. H/o ICD Plan Weaned off vasopressors .  Hypovolemic  Acute hypoxic Respiratory Failure s/p PEA arrest c/b aspiration PNA and possible flail chest s/p  CPR 5/31. superimposed on recent  viral pneumonia and asthma exacerbation and h/o OSA and PH.  Had been weaned to room air.  Plan Zosyn, follow-up cultures PRVC, mental status precludes SBT Chest was provided, VAP bundle  Lactic acidosis  -slow to clear. Felt 2/2 multiorgan dysfxn/and cirrhosis not clearing Plan Supportive care  Acute metabolic encephalopathy now w/ concern for anoxic brain injury s/p arrest has had on-going delirium,  Was confused 5/31 not following commands CT head was neg. Ammonia was nml  Plan PAD protocol, minimize as able Supportive care   DM w/ Hypoglycemia Plan Start tube feeds  Abdominal Pain Cirrhosis Elevated Liver Enzymes - initial CTA without acute pathology - Liver enzymes elevated in setting of hepatic congestion due to Right heart failure and hypoxemia TFs  Atrial fib  - off coreg and bidil per Heart Failure team from an earlier admission due to hypotension Plan Tele  Holding amiodarone 2/2 increased LFTs Hold AC s/p arrest.    ESRD on HD Plan Nephro following  Anemia of Chronic Disease Plan Cbc Transfuse for hgb < 7  Holding AC for now   Hyperkalemia Plan Discussed with nephrology, Lokelma per tube, HD per nephrology  Best Practice (right click and "Reselect all SmartList Selections" daily)   Diet/type: NPO w/ meds via tube DVT prophylaxis: SCD GI prophylaxis: N/A Lines: N/A Foley:  N/A Code Status:  full code Last date of multidisciplinary goals of care discussion [discussed with patient at bedside 5/27 - wishes to be full code] pt updated at the beside, will update daughter   Labs   CBC: Recent Labs  Lab 09/30/22 1515 10/02/22 1616 10/02/22 1740 10/05/22 0315 10/06/22 0229 10/06/22 1617 10/06/22 1735 10/07/22 0122 10/07/22 0430 10/07/22 0710  WBC 5.0 14.2*   < > 8.7 11.9*  --  12.1* 14.5*  --  11.6*  NEUTROABS 3.7 12.2*  --  8.4*  --   --  11.5*  --   --   --   HGB 11.9* 12.5   < > 12.5 12.0 14.6 11.4*  12.6 15.0 13.0  HCT 37.6 38.6   < > 38.8 36.6 43.0 36.3 39.7 44.0 40.7  MCV 90.0 87.7   < > 88.2 86.9  --  91.2 91.5  --  90.2  PLT 140* 147*   < > 123* 142*  --  94* 102*  --  92*   < > = values in this interval not displayed.     Basic Metabolic Panel: Recent Labs  Lab 10/02/22 1616 10/02/22 1740 10/03/22 1617 10/03/22 1935 10/04/22 0058 10/05/22 0315 10/06/22 0229 10/06/22 1617 10/06/22 1735 10/07/22 0430 10/07/22 0710  NA 134*   < >  --    < > 128* 135 138 133* 139 137 138  K 4.1   < >  --    < > 5.3* 4.9 4.8 4.3 4.0 4.4 5.2*  CL 90*   < >  --    < > 85* 93* 90*  --  90*  --  92*  CO2 25   < >  --    < > 20* 20* 21*  --  19*  --  19*  GLUCOSE 87   < >  --    < > 117* 122* 159*  --  270*  --  167*  BUN 15   < >  --    < > 46* 34* 64*  --  27*  --  37*  CREATININE 3.69*   < >  --    < > 6.89* 4.95* 7.14*  --  3.69*  --  4.65*  CALCIUM 7.7*   < >  --    < > 8.3* 8.4* 8.3*  --  7.4*  --  8.7*  MG 2.2  --   --   --  3.0* 2.5*  --   --  2.2  --   --   PHOS  --   --  6.0*  --   --   --   --   --  7.9*  --   --    < > = values in this interval not displayed.    GFR: Estimated Creatinine Clearance: 8.1 mL/min (A) (by C-G formula based on SCr of 4.65 mg/dL (H)). Recent Labs  Lab 10/03/22 1333 10/03/22 1440 10/04/22 0058 10/05/22 0315 10/06/22 0229 10/06/22 1609 10/06/22 1735 10/07/22 0122 10/07/22 0710  PROCALCITON 9.98  --   --   --   --   --   --   --   --   WBC  --  12.1* 10.5   < > 11.9*  --  12.1* 14.5* 11.6*  LATICACIDVEN  --  4.7* 2.2*  --   --  7.8* >9.0*  --   --    < > = values in this interval not displayed.     Liver Function Tests: Recent Labs  Lab 10/03/22 1440 10/04/22 0058 10/06/22 0229 10/06/22 1735 10/07/22 0710  AST 1,499* 1,079* 314* 321* 366*  ALT 962* 887* 633* 515* 540*  ALKPHOS 114 102 122 120 138*  BILITOT 2.7* 2.8* 3.1* 3.5* 4.7*  PROT 8.6* 8.2* 8.2* 7.0 8.5*  ALBUMIN 4.0 3.8 3.7 3.2* 3.5    Recent Labs  Lab 10/03/22 0017   LIPASE 34  AMYLASE 1,215*    Recent Labs  Lab 10/06/22 0229  AMMONIA 25     ABG    Component Value Date/Time   PHART 7.386 10/07/2022 0430   PCO2ART 44.9 10/07/2022 0430   PO2ART 141 (H) 10/07/2022 0430   HCO3 26.8 10/07/2022 0430   TCO2 28 10/07/2022 0430   ACIDBASEDEF 12.0 (H) 10/06/2022 1617   O2SAT 99 10/07/2022 0430     Coagulation Profile: Recent Labs  Lab 10/03/22 1333  INR 2.5*     Cardiac Enzymes: No results for input(s): "CKTOTAL", "CKMB", "CKMBINDEX", "TROPONINI" in the last 168 hours.  HbA1C: Hgb A1c MFr Bld  Date/Time Value Ref Range Status  09/05/2022 02:44 PM 5.5 4.6 - 6.5 % Final    Comment:    Glycemic Control Guidelines for People with Diabetes:Non Diabetic:  <6%Goal of Therapy: <7%Additional Action Suggested:  >8%   10/06/2021 11:59 AM 4.9 4.6 - 6.5 % Final    Comment:    Glycemic Control Guidelines for People with Diabetes:Non Diabetic:  <6%Goal of Therapy: <7%Additional Action Suggested:  >8%     CBG: Recent Labs  Lab 10/06/22 1942 10/06/22 2342 10/07/22 0341 10/07/22 0752 10/07/22 1132  GLUCAP 317* 279* 171* 172* 174*     Review of Systems:   A 10 point review of systems was performed and is negative unless stated in HPI  Past Medical History:  She,  has a past medical history of Anemia, Angiectasia, Angiodysplasia of small intestine (06/10/2020), Aortic atherosclerosis (HCC) (08/30/2017), Arthritis, Asthma, Biventricular ICD (implantable cardioverter-defibrillator) in place (01/08/2007), 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) (09/24/2020), Chronic obstructive pulmonary disease (HCC), Chronic systolic congestive heart failure (HCC), 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 (HCV) (HCC), Diabetes (HCC), Diverticulitis of left colon status post robotic low anterior to sigmoid resection 05/22/2018 (07/31/2017), Duodenal arteriovenous malformation, End stage renal disease (HCC) (03/22/2021),  Esophageal varices (HCC), Glaucoma, Gout, History of blood transfusion (~ 11/2017), Hypertension associated with diabetes (HCC) (06/07/2017), Hypothyroidism, Internal hemorrhoids, LBBB (left bundle branch block), Malnutrition of moderate degree (07/09/2020), MDD (major depressive disorder), recurrent, in partial remission (HCC) (06/07/2017), OBSTRUCTIVE SLEEP APNEA (11/14/2007), OSTEOPENIA (09/30/2008), Persistent atrial fibrillation (HCC) (02/06/2020), Polyneuropathy associated with underlying disease (HCC) (06/09/2021), Pulmonary hypertension (HCC) on echocardiogram (01/14/2018), Secondary esophageal varices without bleeding (HCC) (12/23/2020), Secondary hypercoagulable state (HCC) (02/06/2020), and Type 2 diabetes, controlled, with renal manifestation (HCC) (11/24/2013).   Surgical History:   Past Surgical History:  Procedure Laterality Date   A/V FISTULAGRAM N/A 03/28/2021   Procedure: A/V FISTULAGRAM;  Surgeon: Maeola Harman, MD;  Location: [facility] INVASIVE CV LAB;  Service: Cardiovascular;  Laterality: N/A;   APPENDECTOMY     AV FISTULA PLACEMENT Left 08/05/2020   Procedure: LEFT UPPER EXTREMITY ARTERIOVENOUS (AV) FISTULA CREATION;  Surgeon: Leonie Douglas, MD;  Location: [facility] OR;  Service: Vascular;  Laterality: Left;   BALLOON ENTEROSCOPY N/A 06/21/2022   Procedure: BALLOON ENTEROSCOPY;  Surgeon: Shellia Cleverly, DO;  Location: [facility] ENDOSCOPY;  Service: Gastroenterology;  Laterality: N/A;   BASCILIC VEIN TRANSPOSITION Left 09/16/2020   Procedure: LEFT SECOND STAGE BASCILIC VEIN TRANSPOSITION;  Surgeon: Leonie Douglas, MD;  Location: [facility] OR;  Service: Vascular;  Laterality: Left;  PERIPHERAL NERVE BLOCK   CARDIAC CATHETERIZATION     CARDIOVERSION N/A 12/14/2014   Procedure: CARDIOVERSION;  Surgeon: Lars Masson, MD;  Location: [facility] ENDOSCOPY;  Service: Cardiovascular;  Laterality: N/A;   CARDIOVERSION N/A 02/26/2017   Procedure: CARDIOVERSION;  Surgeon: Marinus Maw, MD;   Location: [facility] INVASIVE CV LAB;  Service: Cardiovascular;  Laterality: N/A;   CARDIOVERSION N/A 07/24/2017   Procedure: CARDIOVERSION;  Surgeon: Elease Hashimoto Deloris Ping, MD;  Location: [facility] ENDOSCOPY;  Service: Cardiovascular;  Laterality: N/A;   CARDIOVERSION N/A 02/12/2020   Procedure: CARDIOVERSION;  Surgeon: Laurey Morale, MD;  Location: [facility] ENDOSCOPY;  Service: Cardiovascular;  Laterality: N/A;   CARDIOVERSION N/A 09/15/2021   Procedure: CARDIOVERSION;  Surgeon: Laurey Morale, MD;  Location: [facility] ENDOSCOPY;  Service: Cardiovascular;  Laterality: N/A;   CARPAL TUNNEL RELEASE  07/21/2021   COLONOSCOPY N/A 11/08/2017   Procedure: COLONOSCOPY;  Surgeon: Rachael Fee, MD;  Location: [facility] ENDOSCOPY;  Service: Endoscopy;  Laterality: N/A;   COLONOSCOPY WITH PROPOFOL N/A 05/31/2020   Procedure: COLONOSCOPY WITH PROPOFOL;  Surgeon: Hilarie Fredrickson, MD;  Location: [facility] ENDOSCOPY;  Service: Endoscopy;  Laterality: N/A;   COLONOSCOPY WITH PROPOFOL N/A 10/26/2021   Procedure: COLONOSCOPY WITH PROPOFOL;  Surgeon: Imogene Burn, MD;  Location: [facility] ENDOSCOPY;  Service: Gastroenterology;  Laterality: N/A;   DILATION AND CURETTAGE OF UTERUS     ENTEROSCOPY N/A 03/02/2020   Procedure: ENTEROSCOPY;  Surgeon: Benancio Deeds, MD;  Location: [facility] ENDOSCOPY;  Service: Gastroenterology;  Laterality: N/A;   ENTEROSCOPY N/A 06/08/2022   Procedure: ENTEROSCOPY;  Surgeon: Beverley Fiedler, MD;  Location: [facility] ENDOSCOPY;  Service: Gastroenterology;  Laterality: N/A;  EP IMPLANTABLE DEVICE N/A 10/06/2015   Procedure: BIV ICD Generator Changeout;  Surgeon: Duke Salvia, MD;  Location: [facility] INVASIVE CV LAB;  Service: Cardiovascular;  Laterality: N/A;   ESOPHAGOGASTRODUODENOSCOPY N/A 10/26/2017   Procedure: ESOPHAGOGASTRODUODENOSCOPY (EGD);  Surgeon: Meryl Dare, MD;  Location: [facility] ENDOSCOPY;  Service: Endoscopy;  Laterality: N/A;   ESOPHAGOGASTRODUODENOSCOPY (EGD) WITH PROPOFOL N/A 11/07/2017   Procedure:  ESOPHAGOGASTRODUODENOSCOPY (EGD) WITH PROPOFOL;  Surgeon: Rachael Fee, MD;  Location: [facility] ENDOSCOPY;  Service: Endoscopy;  Laterality: N/A;   ESOPHAGOGASTRODUODENOSCOPY (EGD) WITH PROPOFOL N/A 05/29/2020   Procedure: ESOPHAGOGASTRODUODENOSCOPY (EGD) WITH PROPOFOL;  Surgeon: Sherrilyn Rist, MD;  Location: [facility] ENDOSCOPY;  Service: Gastroenterology;  Laterality: N/A;   ESOPHAGOGASTRODUODENOSCOPY (EGD) WITH PROPOFOL N/A 10/26/2021   Procedure: ESOPHAGOGASTRODUODENOSCOPY (EGD) WITH PROPOFOL;  Surgeon: Imogene Burn, MD;  Location: [facility] ENDOSCOPY;  Service: Gastroenterology;  Laterality: N/A;   ESOPHAGOGASTRODUODENOSCOPY (EGD) WITH PROPOFOL N/A 03/08/2022   Procedure: ESOPHAGOGASTRODUODENOSCOPY (EGD) WITH PROPOFOL;  Surgeon: Lynann Bologna, MD;  Location: [facility] ENDOSCOPY;  Service: Gastroenterology;  Laterality: N/A;   GIVENS CAPSULE STUDY N/A 05/19/2020   Procedure: GIVENS CAPSULE STUDY;  Surgeon: Iva Boop, MD;  Location: [facility] ENDOSCOPY;  Service: Endoscopy;  Laterality: N/A;  .adm for obs since pacemaker, PA wil enter order and see pt   GIVENS CAPSULE STUDY N/A 05/29/2020   Procedure: GIVENS CAPSULE STUDY;  Surgeon: Sherrilyn Rist, MD;  Location: [facility] ENDOSCOPY;  Service: Gastroenterology;  Laterality: N/A;   GIVENS CAPSULE STUDY N/A 06/08/2022   Procedure: GIVENS CAPSULE STUDY;  Surgeon: Beverley Fiedler, MD;  Location: [facility] ENDOSCOPY;  Service: Gastroenterology;  Laterality: N/A;   HEMATOMA EVACUATION Left 09/24/2020   Procedure: EVACUATION HEMATOMA ARM;  Surgeon: Larina Earthly, MD;  Location: [facility] OR;  Service: Vascular;  Laterality: Left;   HEMOSTASIS CLIP PLACEMENT  06/21/2022   Procedure: HEMOSTASIS CLIP PLACEMENT;  Surgeon: Shellia Cleverly, DO;  Location: [facility] ENDOSCOPY;  Service: Gastroenterology;;   HOT HEMOSTASIS N/A 10/26/2017   Procedure: HOT HEMOSTASIS (ARGON PLASMA COAGULATION/BICAP);  Surgeon: Meryl Dare, MD;  Location: [facility] ENDOSCOPY;  Service: Endoscopy;  Laterality: N/A;   HOT  HEMOSTASIS N/A 03/02/2020   Procedure: HOT HEMOSTASIS (ARGON PLASMA COAGULATION/BICAP);  Surgeon: Benancio Deeds, MD;  Location: [facility] ENDOSCOPY;  Service: Gastroenterology;  Laterality: N/A;   HOT HEMOSTASIS N/A 05/31/2020   Procedure: HOT HEMOSTASIS (ARGON PLASMA COAGULATION/BICAP);  Surgeon: Hilarie Fredrickson, MD;  Location: [facility] ENDOSCOPY;  Service: Endoscopy;  Laterality: N/A;   HOT HEMOSTASIS N/A 03/08/2022   Procedure: HOT HEMOSTASIS (ARGON PLASMA COAGULATION/BICAP);  Surgeon: Lynann Bologna, MD;  Location: [facility] ENDOSCOPY;  Service: Gastroenterology;  Laterality: N/A;   HOT HEMOSTASIS N/A 06/08/2022   Procedure: HOT HEMOSTASIS (ARGON PLASMA COAGULATION/BICAP);  Surgeon: Beverley Fiedler, MD;  Location: [facility] ENDOSCOPY;  Service: Gastroenterology;  Laterality: N/A;   HOT HEMOSTASIS N/A 06/21/2022   Procedure: HOT HEMOSTASIS (ARGON PLASMA COAGULATION/BICAP);  Surgeon: Shellia Cleverly, DO;  Location: [facility] ENDOSCOPY;  Service: Gastroenterology;  Laterality: N/A;   ICD GENERATOR CHANGEOUT N/A 07/21/2022   Procedure: ICD GENERATOR CHANGEOUT;  Surgeon: Duke Salvia, MD;  Location: [facility] INVASIVE CV LAB;  Service: Cardiovascular;  Laterality: N/A;   INSERT / REPLACE / REMOVE PACEMAKER  ?2008   IR PERC TUN PERIT CATH WO PORT S&I /IMAG  07/05/2020   PERIPHERAL VASCULAR INTERVENTION Left 03/28/2021   Procedure: PERIPHERAL VASCULAR INTERVENTION;  Surgeon: Maeola Harman, MD;  Location: [facility] INVASIVE CV LAB;  Service: Cardiovascular;  Laterality:  Left;   POLYPECTOMY  11/08/2017   Procedure: POLYPECTOMY;  Surgeon: Rachael Fee, MD;  Location: [facility] ENDOSCOPY;  Service: Endoscopy;;   PROCTOSCOPY N/A 05/22/2018   Procedure: RIGID PROCTOSCOPY;  Surgeon: Karie Soda, MD;  Location: [facility] ORS;  Service: General;  Laterality: N/A;   RIGHT HEART CATH N/A 02/13/2018   Procedure: RIGHT HEART CATH;  Surgeon: Laurey Morale, MD;  Location: [facility] INVASIVE CV LAB;  Service: Cardiovascular;  Laterality: N/A;   RIGHT HEART  CATH N/A 06/23/2020   Procedure: RIGHT HEART CATH;  Surgeon: Laurey Morale, MD;  Location: [facility] INVASIVE CV LAB;  Service: Cardiovascular;  Laterality: N/A;   RIGHT/LEFT HEART CATH AND CORONARY ANGIOGRAPHY N/A 12/11/2019   Procedure: RIGHT/LEFT HEART CATH AND CORONARY ANGIOGRAPHY;  Surgeon: Laurey Morale, MD;  Location: [facility] INVASIVE CV LAB;  Service: Cardiovascular;  Laterality: N/A;   SUBMUCOSAL TATTOO INJECTION  03/02/2020   Procedure: SUBMUCOSAL TATTOO INJECTION;  Surgeon: Benancio Deeds, MD;  Location: [facility] ENDOSCOPY;  Service: Gastroenterology;;   SUBMUCOSAL TATTOO INJECTION  05/31/2020   Procedure: SUBMUCOSAL TATTOO INJECTION;  Surgeon: Hilarie Fredrickson, MD;  Location: [facility] ENDOSCOPY;  Service: Endoscopy;;   SUBMUCOSAL TATTOO INJECTION  06/21/2022   Procedure: SUBMUCOSAL TATTOO INJECTION;  Surgeon: Shellia Cleverly, DO;  Location: [facility] ENDOSCOPY;  Service: Gastroenterology;;   TEE WITHOUT CARDIOVERSION N/A 08/02/2021   Procedure: TRANSESOPHAGEAL ECHOCARDIOGRAM (TEE);  Surgeon: Laurey Morale, MD;  Location: [facility] ENDOSCOPY;  Service: Cardiovascular;  Laterality: N/A;   TUBAL LIGATION     XI ROBOTIC ASSISTED LOWER ANTERIOR RESECTION N/A 05/22/2018   Procedure: XI ROBOTIC ASSISTED SIGMOID COLOECTOMY MOBILIZATION OF SPLENIC FLEXURE, FIREFLY ASSESSMENT OF PERFUSION;  Surgeon: Karie Soda, MD;  Location: [facility] ORS;  Service: General;  Laterality: N/A;  ERAS PATHWAY     Social History:   reports that she has never smoked. She has never used smokeless tobacco. She reports that she does not currently use alcohol. She reports that she does not currently use drugs after having used the following drugs: Marijuana.   Family History:  Her family history includes Asthma in her father and sister; Heart attack in her father and mother; Lung cancer in her sister; Stroke in her brother. There is no history of Colon cancer, Esophageal cancer, Pancreatic cancer, Stomach cancer, or Liver disease.    Allergies No Known Allergies   Home Medications  Prior to Admission medications   Medication Sig Start Date End Date Taking? Authorizing Provider  albuterol (VENTOLIN HFA) 108 (90 Base) MCG/ACT inhaler Inhale 2 puffs into the lungs every 6 (six) hours as needed for wheezing or shortness of breath. 04/07/22   Candace Ramus, Lesia Sago, MD  amiodarone (PACERONE) 200 MG tablet Take 1 tablet by mouth once daily 08/30/22   Saguier, Ramon Dredge, PA-C  ammonium lactate (LAC-HYDRIN) 12 % lotion Apply daily on forearms. 09/05/22   Sharlene Dory, DO  amoxicillin-clavulanate (AUGMENTIN) 875-125 MG tablet Take 1 tablet by mouth 2 (two) times daily. 09/28/22   Clayborne [name], NP  apixaban (ELIQUIS) 2.5 MG TABS tablet Take 1 tablet (2.5 mg total) by mouth 2 (two) times daily. 03/11/22   Pokhrel, Rebekah [place], MD  cyclobenzaprine (FLEXERIL) 5 MG tablet Take 5 mg by mouth at bedtime as needed for muscle spasms. 12/07/21   [provider]  gabapentin (NEURONTIN) 300 MG capsule Take 1 capsule (300 mg total) by mouth daily. Fill one rx. Computer may have sent over 2 Patient taking differently: Take 300 mg by mouth daily. 01/10/22  Saguier, Ramon Dredge, PA-C  HYDROcodone bit-homatropine (HYCODAN) 5-1.5 MG/5ML syrup Take 5 mLs by mouth every 8 (eight) hours as needed for up to 5 days for cough. 09/28/22 10/03/22  Clayborne [name], NP  ipratropium (ATROVENT HFA) 17 MCG/ACT inhaler Inhale 2 puffs into the lungs every 6 (six) hours as needed for wheezing. 03/24/22   Saguier, Ramon Dredge, PA-C  levothyroxine (SYNTHROID) 125 MCG tablet Take 1 tablet (125 mcg total) by mouth daily before breakfast. 01/16/22   Saguier, Ramon Dredge, PA-C  lidocaine-prilocaine (EMLA) cream Apply 1 Application topically as needed.    [provider]  methylPREDNISolone (MEDROL) 4 MG tablet Standard 6 day taper 09/28/22   Clayborne [name], NP  nitroGLYCERIN (NITROSTAT) 0.4 MG SL tablet Place 1 tablet (0.4 mg total) under the tongue every 5 (five) minutes as  needed for chest pain. Patient not taking: Reported on 09/28/2022 10/27/21   Leroy Sea, MD  triamcinolone cream (KENALOG) 0.1 % Apply to shins twice dialy for 7 days. 09/05/22   Sharlene Dory, DO  Vitamin D, Ergocalciferol, (DRISDOL) 1.25 MG (50000 UNIT) CAPS capsule Take 1 capsule (50,000 Units total) by mouth every 7 (seven) days. 09/06/22   Sharlene Dory, DO     Critical care time:     CRITICAL CARE Performed by: Karren Burly   Total critical care time: 35 minutes  Critical care time was exclusive of separately billable procedures and treating other patients.  Critical care was necessary to treat or prevent imminent or life-threatening deterioration.  Critical care was time spent personally by me on the following activities: development of treatment plan with patient and/or surrogate as well as nursing, discussions with consultants, evaluation of patient's response to treatment, examination of patient, obtaining history from patient or surrogate, ordering and performing treatments and interventions, ordering and review of laboratory studies, ordering and review of radiographic studies, pulse oximetry and re-evaluation of patient's condition.  Karren Burly, MD [facility] Pulmonary/Critical Care See Amion OR # [phone] if no answer

## 2022-10-07 NOTE — Progress Notes (Signed)
CBG 44 upon arrival to unit. 1 amp of dextrose given, CCM MD notified. CBG rechecked 15 minutes later recheck was 63. 2nd amp of dextrose given. 15 minute post recheck CBG was 161. MD notified.

## 2022-10-07 NOTE — Progress Notes (Signed)
[facility] Progress Note  Subjective: pt had code blue yest around 3 pm, 5 min CPR, pt intubated, responded to IV epi. In ICU pt on vent, dtr at bedside.   Vitals:   10/07/22 0815 10/07/22 0824 10/07/22 0830 10/07/22 0836  BP: 137/67  (!) 87/57 (!) 87/60  Pulse: 92  91 91  Resp: (!) 35  15 (!) 34  Temp:      TempSrc:      SpO2: 100% 100% 100% 100%  Weight:        Exam: Gen on vent, sedated throat w/ ETT No jvd or bruits Chest clear anterior/ lateral RRR no RG Abd soft ntnd no mass or ascites +bs Ext no LE edema Neuro is on vent, sedated   RIJ TDC in place      Home meds include - albuterol, amiodarone, eliquis, gabapentin, hydrocodone prn, synthroid 125 mcg, sl ntg prn, prn's/ vits/ supps      OP HD: SW MWF 3.5h  400/600   58.5kjg  2/2 bath  RIJ TDC  Heparin none - last HD 5/27, post wt 57.9kg - sensipar 30mg  po tiw - hectorol 4 mcg IV tiw    CTA chest  5/17 - IMPRESSION: 1. No pulmonary embolus allowing for motion artifact limitations. 2. Multi chamber cardiomegaly with reflux of contrast into the hepatic veins and IVC consistent with elevated right heart pressures. 3. Mosaic attenuation of the pulmonary parenchyma, can be seen with small airways disease or small vessel disease.    Resp panel by CPR --> + for rhinovirus/ enterovirus    CXR 5/30 - IMPRESSION: 1. Increased scattered bilateral patchy opacities, likely multifocal infection/inflammation.   Assessment/ Plan: Hypoxemic resp failure/ multifocal pna - was on 12 L HFNC initially then seemed to be improving but had code blue yest w/ CPR and intubation and ROSC at 5 min. F/u CXR showed increased bilat patchy opacities, likely multifocal infection/ inflammation. Bronch yesterday also showed pus/ tenacious secretions. Is on IV abx per CCM.  ID - resp panel + for rhinovirus/ enterovirus as well.  Abd pain -  seen by gen surg, repeat abd CT negative. Surg signed off.  ESRD - on HD MWF. Had HD yesterday.  Next HD Monday.  HTN/ volume - bp's low to normal, pt looks dry and is 3 kg under dry wt. Will start IVF's at 75 cc/hr for 24 hrs.  Anemia esrd - Hb > 10 here here, no esa needs MBD ckd - CCa and phos in range. Cont sensipar and IV vdra.  H/o pHTN H/o atrial fib   Rob Arlean Hopping MD [facility] 10/07/2022, 9:05 AM  Recent Labs  Lab 10/03/22 1617 10/03/22 1935 10/06/22 1735 10/07/22 0122 10/07/22 0430 10/07/22 0710  HGB  --    < > 11.4*   < > 15.0 13.0  ALBUMIN  --    < > 3.2*  --   --  3.5  CALCIUM  --    < > 7.4*  --   --  8.7*  PHOS 6.0*  --  7.9*  --   --   --   CREATININE  --    < > 3.69*  --   --  4.65*  K  --    < > 4.0  --  4.4 5.2*   < > = values in this interval not displayed.    No results for input(s): "IRON", "TIBC", "FERRITIN" in the last 168 hours. Inpatient medications:  Chlorhexidine Gluconate Cloth  6 each Topical Q0600   docusate  100 mg Per Tube BID   doxercalciferol  4 mcg Intravenous Q M,W,F-HD   famotidine  10 mg Per Tube Daily   insulin aspart  0-9 Units Subcutaneous Q4H   ipratropium-albuterol  3 mL Nebulization Q6H   [START ON 10/12/2022] levothyroxine  62.5 mcg Intravenous Daily   multivitamin  1 tablet Per Tube QHS   mouth rinse  15 mL Mouth Rinse Q2H   polyethylene glycol  17 g Per Tube Daily    sodium chloride Stopped (10/07/22 0752)   norepinephrine (LEVOPHED) Adult infusion Stopped (10/06/22 2334)   piperacillin-tazobactam (ZOSYN)  IV 100 mL/hr at 10/07/22 0800   docusate sodium, fentaNYL (SUBLIMAZE) injection, fentaNYL (SUBLIMAZE) injection, mineral oil-hydrophilic petrolatum, mouth rinse

## 2022-10-07 DEATH — deceased

## 2022-10-08 ENCOUNTER — Inpatient Hospital Stay (HOSPITAL_COMMUNITY): Payer: 59

## 2022-10-08 DIAGNOSIS — J9601 Acute respiratory failure with hypoxia: Secondary | ICD-10-CM | POA: Diagnosis not present

## 2022-10-08 LAB — COMPREHENSIVE METABOLIC PANEL
ALT: 540 U/L — ABNORMAL HIGH (ref 0–44)
ALT: 370 U/L — ABNORMAL HIGH (ref 0–44)
AST: 262 U/L — ABNORMAL HIGH (ref 15–41)
Albumin: 2.9 g/dL — ABNORMAL LOW (ref 3.5–5.0)
Alkaline Phosphatase: 138 U/L — ABNORMAL HIGH (ref 38–126)
Alkaline Phosphatase: 104 U/L (ref 38–126)
Anion gap: 20 — ABNORMAL HIGH (ref 5–15)
BUN: 64 mg/dL — ABNORMAL HIGH (ref 8–23)
CO2: 19 mmol/L — ABNORMAL LOW (ref 22–32)
CO2: 23 mmol/L (ref 22–32)
Calcium: 8.7 mg/dL — ABNORMAL LOW (ref 8.9–10.3)
Calcium: 8.1 mg/dL — ABNORMAL LOW (ref 8.9–10.3)
Chloride: 92 mmol/L — ABNORMAL LOW (ref 98–111)
Chloride: 97 mmol/L — ABNORMAL LOW (ref 98–111)
Creatinine, Ser: 4.65 mg/dL — ABNORMAL HIGH (ref 0.44–1.00)
Creatinine, Ser: 6.52 mg/dL — ABNORMAL HIGH (ref 0.44–1.00)
GFR, Estimated: 6 mL/min — ABNORMAL LOW (ref >60)
Glucose, Bld: 167 mg/dL — ABNORMAL HIGH (ref 70–99)
Glucose, Bld: 187 mg/dL — ABNORMAL HIGH (ref 70–99)
Potassium: 5.2 mmol/L — ABNORMAL HIGH (ref 3.5–5.1)
Potassium: 3.9 mmol/L (ref 3.5–5.1)
Sodium: 138 mmol/L (ref 135–145)
Sodium: 140 mmol/L (ref 135–145)
Total Bilirubin: 6.2 mg/dL — ABNORMAL HIGH (ref 0.3–1.2)
Total Protein: 8.5 g/dL — ABNORMAL HIGH (ref 6.5–8.1)
Total Protein: 7.2 g/dL (ref 6.5–8.1)

## 2022-10-08 LAB — GLUCOSE, CAPILLARY
Glucose-Capillary: 185 mg/dL — ABNORMAL HIGH (ref 70–99)
Glucose-Capillary: 158 mg/dL — ABNORMAL HIGH (ref 70–99)
Glucose-Capillary: 187 mg/dL — ABNORMAL HIGH (ref 70–99)
Glucose-Capillary: 161 mg/dL — ABNORMAL HIGH (ref 70–99)
Glucose-Capillary: 145 mg/dL — ABNORMAL HIGH (ref 70–99)
Glucose-Capillary: 149 mg/dL — ABNORMAL HIGH (ref 70–99)

## 2022-10-08 LAB — CBC
HCT: 35 % — ABNORMAL LOW (ref 36.0–46.0)
HCT: 34 % — ABNORMAL LOW (ref 36.0–46.0)
Hemoglobin: 11.4 g/dL — ABNORMAL LOW (ref 12.0–15.0)
Hemoglobin: 11 g/dL — ABNORMAL LOW (ref 12.0–15.0)
MCH: 28.8 pg (ref 26.0–34.0)
MCH: 28.9 pg (ref 26.0–34.0)
MCHC: 32.6 g/dL (ref 30.0–36.0)
MCHC: 32.4 g/dL (ref 30.0–36.0)
MCV: 88.4 fL (ref 80.0–100.0)
MCV: 89.5 fL (ref 80.0–100.0)
Platelets: 69 10*3/uL — ABNORMAL LOW (ref 150–400)
Platelets: 60 10*3/uL — ABNORMAL LOW (ref 150–400)
RBC: 3.96 MIL/uL (ref 3.87–5.11)
RBC: 3.8 MIL/uL — ABNORMAL LOW (ref 3.87–5.11)
RDW: 24.7 % — ABNORMAL HIGH (ref 11.5–15.5)
RDW: 25.2 % — ABNORMAL HIGH (ref 11.5–15.5)
WBC: 14.6 10*3/uL — ABNORMAL HIGH (ref 4.0–10.5)
WBC: 14.3 10*3/uL — ABNORMAL HIGH (ref 4.0–10.5)
nRBC: 10.4 % — ABNORMAL HIGH (ref 0.0–0.2)
nRBC: 13 % — ABNORMAL HIGH (ref 0.0–0.2)

## 2022-10-08 LAB — BLOOD GAS, ARTERIAL
Acid-base deficit: 4.5 mmol/L — ABNORMAL HIGH (ref 0.0–2.0)
Bicarbonate: 24.4 mmol/L (ref 20.0–28.0)
Drawn by: 164
O2 Saturation: 96.2 %
Patient temperature: 37
pCO2 arterial: 61 mmHg — ABNORMAL HIGH (ref 32–48)
pH, Arterial: 7.21 — ABNORMAL LOW (ref 7.35–7.45)
pO2, Arterial: 87 mmHg (ref 83–108)

## 2022-10-08 LAB — TROPONIN I (HIGH SENSITIVITY)
Troponin I (High Sensitivity): >24000 ng/L (ref <18)
Troponin I (High Sensitivity): >24000 ng/L (ref <18)

## 2022-10-08 LAB — PHOSPHORUS
Phosphorus: 6.1 mg/dL — ABNORMAL HIGH (ref 2.5–4.6)
Phosphorus: 5.4 mg/dL — ABNORMAL HIGH (ref 2.5–4.6)

## 2022-10-08 LAB — MAGNESIUM
Magnesium: 2.3 mg/dL (ref 1.7–2.4)
Magnesium: 2.4 mg/dL (ref 1.7–2.4)

## 2022-10-08 LAB — CULTURE, RESPIRATORY W GRAM STAIN

## 2022-10-08 MED ORDER — ROCURONIUM BROMIDE 10 MG/ML (PF) SYRINGE
PREFILLED_SYRINGE | INTRAVENOUS | Status: AC
  Administered 2022-10-08: 80 mg via INTRAVENOUS
  Filled 2022-10-08: qty 10

## 2022-10-08 MED ORDER — ORAL CARE MOUTH RINSE: 15.0000 mL | OROMUCOSAL | Status: DC | PRN

## 2022-10-08 MED ORDER — SODIUM CHLORIDE 0.9 % IV SOLN: INTRAVENOUS | Status: AC

## 2022-10-08 MED ORDER — INSULIN ASPART 100 UNIT/ML IJ SOLN
0.0000 [IU] | INTRAMUSCULAR | Status: DC
  Administered 2022-10-08: 2 [IU] via SUBCUTANEOUS
  Administered 2022-10-08: 3 [IU] via SUBCUTANEOUS
  Administered 2022-10-08: 2 [IU] via SUBCUTANEOUS
  Administered 2022-10-08 – 2022-10-09 (×4): 3 [IU] via SUBCUTANEOUS
  Administered 2022-10-09: 2 [IU] via SUBCUTANEOUS
  Administered 2022-10-09: 3 [IU] via SUBCUTANEOUS
  Administered 2022-10-10 (×2): 2 [IU] via SUBCUTANEOUS
  Administered 2022-10-10: 5 [IU] via SUBCUTANEOUS
  Administered 2022-10-10: 3 [IU] via SUBCUTANEOUS
  Administered 2022-10-10: 5 [IU] via SUBCUTANEOUS
  Administered 2022-10-10: 3 [IU] via SUBCUTANEOUS
  Administered 2022-10-10 – 2022-10-12 (×6): 2 [IU] via SUBCUTANEOUS
  Administered 2022-10-12 – 2022-10-13 (×2): 3 [IU] via SUBCUTANEOUS
  Administered 2022-10-13 – 2022-10-14 (×5): 2 [IU] via SUBCUTANEOUS

## 2022-10-08 MED ORDER — FENTANYL CITRATE PF 50 MCG/ML IJ SOSY
25.0000 ug | PREFILLED_SYRINGE | INTRAMUSCULAR | Status: DC | PRN
  Administered 2022-10-09 – 2022-10-10 (×2): 50 ug via INTRAVENOUS
  Filled 2022-10-08: qty 1

## 2022-10-08 MED ORDER — FAMOTIDINE 20 MG PO TABS
20.0000 mg | ORAL_TABLET | Freq: Two times a day (BID) | ORAL | Status: DC
  Administered 2022-10-08: 20 mg
  Filled 2022-10-08: qty 1

## 2022-10-08 MED ORDER — PHENYLEPHRINE 80 MCG/ML (10ML) SYRINGE FOR IV PUSH (FOR BLOOD PRESSURE SUPPORT)
PREFILLED_SYRINGE | INTRAVENOUS | Status: AC
  Administered 2022-10-08: 160 ug
  Filled 2022-10-08: qty 10

## 2022-10-08 MED ORDER — ETOMIDATE 2 MG/ML IV SOLN: 20.0000 mg | INTRAVENOUS | Status: AC

## 2022-10-08 MED ORDER — CHLORHEXIDINE GLUCONATE CLOTH 2 % EX PADS
6.0000 | MEDICATED_PAD | Freq: Every day | CUTANEOUS | Status: DC
  Administered 2022-10-08 – 2022-10-15 (×9): 6 via TOPICAL

## 2022-10-08 MED ORDER — ROCURONIUM BROMIDE 10 MG/ML (PF) SYRINGE: 80.0000 mg | PREFILLED_SYRINGE | INTRAVENOUS | Status: AC

## 2022-10-08 MED ORDER — FENTANYL CITRATE PF 50 MCG/ML IJ SOSY: 25.0000 ug | PREFILLED_SYRINGE | INTRAMUSCULAR | Status: DC | PRN

## 2022-10-08 MED ORDER — FENTANYL CITRATE PF 50 MCG/ML IJ SOSY
PREFILLED_SYRINGE | INTRAMUSCULAR | Status: AC
  Filled 2022-10-08: qty 2

## 2022-10-08 MED ORDER — LIDOCAINE 5 % EX PTCH
1.0000 | MEDICATED_PATCH | CUTANEOUS | Status: DC
  Administered 2022-10-08 – 2022-10-15 (×7): 1 via TRANSDERMAL
  Filled 2022-10-08 (×7): qty 1

## 2022-10-08 MED ORDER — ETOMIDATE 2 MG/ML IV SOLN
INTRAVENOUS | Status: AC
  Administered 2022-10-08: 20 mg via INTRAVENOUS
  Filled 2022-10-08: qty 20

## 2022-10-08 MED ORDER — THIAMINE MONONITRATE 100 MG PO TABS
100.0000 mg | ORAL_TABLET | Freq: Every day | ORAL | Status: AC
  Administered 2022-10-08 – 2022-10-11 (×4): 100 mg
  Filled 2022-10-08 (×4): qty 1

## 2022-10-08 MED ORDER — EPINEPHRINE 1 MG/10ML IJ SOSY
PREFILLED_SYRINGE | INTRAMUSCULAR | Status: AC
  Filled 2022-10-08: qty 20

## 2022-10-08 MED ORDER — PANTOPRAZOLE SODIUM 40 MG IV SOLR
40.0000 mg | Freq: Every day | INTRAVENOUS | Status: DC
  Administered 2022-10-08: 40 mg via INTRAVENOUS
  Filled 2022-10-08: qty 10

## 2022-10-08 MED ORDER — SODIUM CHLORIDE 0.9 % IV SOLN: INTRAVENOUS | Status: DC | PRN

## 2022-10-08 MED ORDER — MIDAZOLAM HCL 2 MG/2ML IJ SOLN
INTRAMUSCULAR | Status: AC
  Filled 2022-10-08: qty 2

## 2022-10-08 NOTE — Procedures (Signed)
Extubation Procedure Note  Patient Details:   Name: Morgan Clay DOB: 07-20-1945 MRN: [id number]   Airway Documentation:    Vent end date: 10/08/22 Vent end time: 1030   Evaluation  O2 sats: stable throughout Complications: No apparent complications Patient did tolerate procedure well. Bilateral Breath Sounds: Rhonchi, Diminished, Other (Comment) (coarse)   Patient extubated to 4L [place] per order. Cuff leak noted prior to extubation. Patient able to vocalize post extubation.  Clent Ridges 10/08/2022, 10:34 AM

## 2022-10-08 NOTE — Progress Notes (Signed)
Patient with increased WOB post extubation. Patient taking shallow breaths, RR in the 30's. CCM MD notified

## 2022-10-08 NOTE — Progress Notes (Signed)
NAME:  Morgan Clay, MRN:  [id number], DOB:  1946/04/08, LOS: 6 ADMISSION DATE:  10/02/2022, CONSULTATION DATE:  10/02/22 REFERRING MD:  Ernie Avena, MD CHIEF COMPLAINT:  Respiratory Failure  History of Present Illness:  Morgan Clay is a 77 year old woman, never smoker with history of heart failure, pulmonary hypertension, asthma, ESRD on HD, cirrhosis secondary to hepatitis C s/p Harvoni treatment, atrial fibrillation and esophageal varices/small bowel AVMs who presented to [facility] with shortness of breath after dialysis, she was tachypneic with an SpO2 on arrival in the ER.   She was noted to have Lactic acid of 7.7 and was given a bolus with repeat lactic acid 7.6. Due to increase work of breathing she was placed on non-rebreather mask. She was given duoneb and solumedrol 125mg  IV. CTA Chest was negative for PE, there was motion artifact but mosaic attenuation of the pulmonary parenchyma. CT Neck showed possible mild pharyngeal mucosal swelling, but again difficult due to motion artifact. Neck CT was done for neck swelling. CT abdomen done for abdominal pain with no acute findings.   PCCM consulted as patient remained tachypneic with increased work of breathing after transfer from non-rebreather to [facility].   Patient accepted in transfer from [facility] to [facility] ICU.  Pertinent  Medical History   Asthma Pulmonary Hypertension OSA Chronic Systolic Heart Failure Atrial Fibrillation DMII ESRD on HD Cirrhosis Hepatitis C s/p Harvoni Esophageal Varices Duodenal AVM  Significant Hospital Events: Including procedures, antibiotic start and stop dates in addition to other pertinent events   5/27 presented to [facility], transferred to [facility] 5/28 remains on 12L, stable, viral panel + for rhinovirus/Enterovirus  5/29 lactic improving, surgery following serial abdominal exams, down to 4L [place].  5/30 down to room air. Initial resp failure felt  2/2 rhinovirus pneumonitis. Lactate cleared felt 2/2 generalized multiorgan failure which was slow to clear d/t cirrhosis. Remained encephalopathic  5/31 surgery cleared for diet and signed off. Went to [employer]. Completed HD was found by family unresponsive. Not initially on tele. Staff called to room. She had no pulse. Rhythm PEA. Underwent 5 minutes of ACLS 1 round epi before ROSC. Did not regain LOC so intubated and transferred to ICU. Copious oral secretions noted during intubation   Interim History / Subjective:  Off sedation.  Following commands.  Still appears encephalopathic.  Tolerating pressure support ventilation, SBT.  Objective   Blood pressure (!) 138/52, pulse 89, temperature 97.6 F (36.4 C), temperature source Axillary, resp. rate (!) 22, weight 56.3 kg, SpO2 99 %.    Vent Mode: PSV FiO2 (%):  [30 %-40 %] 30 % Set Rate:  [34 bmp-234 bmp] 234 bmp Vt Set:  [350 mL] 350 mL PEEP:  [5 cmH20-8 cmH20] 5 cmH20 Pressure Support:  [10 cmH20-12 cmH20] 10 cmH20 Plateau Pressure:  [20 cmH20-24 cmH20] 20 cmH20   Intake/Output Summary (Last 24 hours) at 10/08/2022 1052 Last data filed at 10/08/2022 1000 Gross per 24 hour  Intake 2846.09 ml  Output --  Net 2846.09 ml    Filed Weights   10/06/22 0900 10/07/22 0452 10/08/22 0447  Weight: 55.5 kg 55.6 kg 56.3 kg   General chronically ill appearing 77 year old female unresponsive on vent s/p arrest HENT PERRL orally intubated Pulm diffuse coarse rhonchi. Asymmetric chest rise noted  Card rrr w/ wide QRs Abd soft Ext warm  Neuro follows commands in all fours  Resolved Hospital Problem list   Elevated trop  Assessment & Plan:  PEA cardiac arrest. Time to ROSC 5 minutes.  Etiology not clear. Has underlying severe PH.  Suspect element of hypovolemia. Plan  Minimize sedation  Circulatory Shock s/p cardiac arrest w/ known Pulmonary Hypertension,  LVH, HFrEF (40-45%), right heart failure and aortic route insuff. H/o ICD Plan Weaned  off vasopressors Overall suspect element of hypovolemia being corrected  Acute hypoxic Respiratory Failure s/p PEA arrest c/b aspiration PNA and possible flail chest s/p CPR 5/31. superimposed on recent  viral pneumonia and asthma exacerbation and h/o OSA and PH.  Had been weaned to room air prior to transfer to ICU Plan Zosyn, follow-up cultures Plan extubation 6/1  Traumatic pneumothorax: Following chest compressions. -Chest x-ray 6/1 shows near resolution, improvement  Lactic acidosis  -slow to clear. Felt 2/2 multiorgan dysfxn/and cirrhosis not clearing Plan Supportive care  Acute metabolic encephalopathy now w/ concern for anoxic brain injury s/p arrest has had on-going delirium,  Was confused 5/31 not following commands CT head was neg. Ammonia was nml  Plan PAD protocol, minimize as able Supportive care   DM w/ Hypoglycemia Plan Start tube feeds  Abdominal Pain Cirrhosis Elevated Liver Enzymes - initial CTA without acute pathology - Liver enzymes elevated in setting of hepatic congestion due to Right heart failure and hypoxemia TFs  Atrial fib  - off coreg and bidil per Heart Failure team from an earlier admission due to hypotension Plan Tele  Holding amiodarone 2/2 increased LFTs Hold AC s/p arrest.    ESRD on HD Plan Nephro following  Anemia of Chronic Disease Plan Cbc Transfuse for hgb < 7  Holding AC for now   Hyperkalemia Plan Discussed with nephrology, Lokelma per tube, HD per nephrology  Best Practice (right click and "Reselect all SmartList Selections" daily)   Diet/type: NPO w/ meds via tube DVT prophylaxis: SCD GI prophylaxis: N/A Lines: N/A Foley:  N/A Code Status:  full code Last date of multidisciplinary goals of care discussion [discussed with patient at bedside 5/27 - wishes to be full code] daughter updated at bedside 6/1  Labs   CBC: Recent Labs  Lab 10/02/22 1616 10/02/22 1740 10/05/22 0315 10/06/22 0229 10/06/22 1617  10/06/22 1735 10/07/22 0122 10/07/22 0430 10/07/22 0710 10/08/22 0553  WBC 14.2*   < > 8.7 11.9*  --  12.1* 14.5*  --  11.6* 14.6*  NEUTROABS 12.2*  --  8.4*  --   --  11.5*  --   --   --   --   HGB 12.5   < > 12.5 12.0   < > 11.4* 12.6 15.0 13.0 11.4*  HCT 38.6   < > 38.8 36.6   < > 36.3 39.7 44.0 40.7 35.0*  MCV 87.7   < > 88.2 86.9  --  91.2 91.5  --  90.2 88.4  PLT 147*   < > 123* 142*  --  94* 102*  --  92* 69*   < > = values in this interval not displayed.     Basic Metabolic Panel: Recent Labs  Lab 10/03/22 1617 10/03/22 1935 10/05/22 0315 10/06/22 0229 10/06/22 1617 10/06/22 1735 10/07/22 0430 10/07/22 0710 10/07/22 1304 10/07/22 1755 10/08/22 0553  NA  --    < > 135 138 133* 139 137 138  --   --  140  K  --    < > 4.9 4.8 4.3 4.0 4.4 5.2*  --   --  3.9  CL  --    < >  93* 90*  --  90*  --  92*  --   --  97*  CO2  --    < > 20* 21*  --  19*  --  19*  --   --  23  GLUCOSE  --    < > 122* 159*  --  270*  --  167*  --   --  187*  BUN  --    < > 34* 64*  --  27*  --  37*  --   --  64*  CREATININE  --    < > 4.95* 7.14*  --  3.69*  --  4.65*  --   --  6.52*  CALCIUM  --    < > 8.4* 8.3*  --  7.4*  --  8.7*  --   --  8.1*  MG  --    < > 2.5*  --   --  2.2  --   --  2.3 2.2 2.3  PHOS 6.0*  --   --   --   --  7.9*  --   --  6.9* 5.9* 6.1*   < > = values in this interval not displayed.    GFR: Estimated Creatinine Clearance: 5.8 mL/min (A) (by C-G formula based on SCr of 6.52 mg/dL (H)). Recent Labs  Lab 10/03/22 1333 10/03/22 1440 10/04/22 0058 10/05/22 0315 10/06/22 1609 10/06/22 1735 10/07/22 0122 10/07/22 0710 10/08/22 0553  PROCALCITON 9.98  --   --   --   --   --   --   --   --   WBC  --  12.1* 10.5   < >  --  12.1* 14.5* 11.6* 14.6*  LATICACIDVEN  --  4.7* 2.2*  --  7.8* >9.0*  --   --   --    < > = values in this interval not displayed.     Liver Function Tests: Recent Labs  Lab 10/04/22 0058 10/06/22 0229 10/06/22 1735 10/07/22 0710  10/08/22 0553  AST 1,079* 314* 321* 366* 262*  ALT 887* 633* 515* 540* 370*  ALKPHOS 102 122 120 138* 104  BILITOT 2.8* 3.1* 3.5* 4.7* 6.2*  PROT 8.2* 8.2* 7.0 8.5* 7.2  ALBUMIN 3.8 3.7 3.2* 3.5 2.9*    Recent Labs  Lab 10/03/22 0017  LIPASE 34  AMYLASE 1,215*    Recent Labs  Lab 10/06/22 0229  AMMONIA 25     ABG    Component Value Date/Time   PHART 7.386 10/07/2022 0430   PCO2ART 44.9 10/07/2022 0430   PO2ART 141 (H) 10/07/2022 0430   HCO3 26.8 10/07/2022 0430   TCO2 28 10/07/2022 0430   ACIDBASEDEF 12.0 (H) 10/06/2022 1617   O2SAT 99 10/07/2022 0430     Coagulation Profile: Recent Labs  Lab 10/03/22 1333  INR 2.5*     Cardiac Enzymes: No results for input(s): "CKTOTAL", "CKMB", "CKMBINDEX", "TROPONINI" in the last 168 hours.  HbA1C: Hgb A1c MFr Bld  Date/Time Value Ref Range Status  09/05/2022 02:44 PM 5.5 4.6 - 6.5 % Final    Comment:    Glycemic Control Guidelines for People with Diabetes:Non Diabetic:  <6%Goal of Therapy: <7%Additional Action Suggested:  >8%   10/06/2021 11:59 AM 4.9 4.6 - 6.5 % Final    Comment:    Glycemic Control Guidelines for People with Diabetes:Non Diabetic:  <6%Goal of Therapy: <7%Additional Action Suggested:  >8%     CBG: Recent Labs  Lab 10/07/22 1529  10/07/22 1925 10/07/22 2331 10/08/22 0333 10/08/22 0737  GLUCAP 148* 147* 146* 185* 158*     Review of Systems:   A 10 point review of systems was performed and is negative unless stated in HPI  Past Medical History:  She,  has a past medical history of Anemia, Angiectasia, Angiodysplasia of small intestine (06/10/2020), Aortic atherosclerosis (HCC) (08/30/2017), Arthritis, Asthma, Biventricular ICD (implantable cardioverter-defibrillator) in place (01/08/2007), 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) (09/24/2020), Chronic obstructive pulmonary disease (HCC), Chronic systolic congestive heart failure (HCC), Compensated cirrhosis  related to hepatitis C virus (HCV) (HCC), Diabetes (HCC), Diverticulitis of left colon status post robotic low anterior to sigmoid resection 05/22/2018 (07/31/2017), Duodenal arteriovenous malformation, End stage renal disease (HCC) (03/22/2021), Esophageal varices (HCC), Glaucoma, Gout, History of blood transfusion (~ 11/2017), Hypertension associated with diabetes (HCC) (06/07/2017), Hypothyroidism, Internal hemorrhoids, LBBB (left bundle branch block), Malnutrition of moderate degree (07/09/2020), MDD (major depressive disorder), recurrent, in partial remission (HCC) (06/07/2017), OBSTRUCTIVE SLEEP APNEA (11/14/2007), OSTEOPENIA (09/30/2008), Persistent atrial fibrillation (HCC) (02/06/2020), Polyneuropathy associated with underlying disease (HCC) (06/09/2021), Pulmonary hypertension (HCC) on echocardiogram (01/14/2018), Secondary esophageal varices without bleeding (HCC) (12/23/2020), Secondary hypercoagulable state (HCC) (02/06/2020), and Type 2 diabetes, controlled, with renal manifestation (HCC) (11/24/2013).   Surgical History:   Past Surgical History:  Procedure Laterality Date   A/V FISTULAGRAM N/A 03/28/2021   Procedure: A/V FISTULAGRAM;  Surgeon: Maeola Harman, MD;  Location: [facility] INVASIVE CV LAB;  Service: Cardiovascular;  Laterality: N/A;   APPENDECTOMY     AV FISTULA PLACEMENT Left 08/05/2020   Procedure: LEFT UPPER EXTREMITY ARTERIOVENOUS (AV) FISTULA CREATION;  Surgeon: Leonie Douglas, MD;  Location: [facility] OR;  Service: Vascular;  Laterality: Left;   BALLOON ENTEROSCOPY N/A 06/21/2022   Procedure: BALLOON ENTEROSCOPY;  Surgeon: Shellia Cleverly, DO;  Location: [facility] ENDOSCOPY;  Service: Gastroenterology;  Laterality: N/A;   BASCILIC VEIN TRANSPOSITION Left 09/16/2020   Procedure: LEFT SECOND STAGE BASCILIC VEIN TRANSPOSITION;  Surgeon: Leonie Douglas, MD;  Location: [facility] OR;  Service: Vascular;  Laterality: Left;  PERIPHERAL NERVE BLOCK   CARDIAC CATHETERIZATION      CARDIOVERSION N/A 12/14/2014   Procedure: CARDIOVERSION;  Surgeon: Lars Masson, MD;  Location: [facility] ENDOSCOPY;  Service: Cardiovascular;  Laterality: N/A;   CARDIOVERSION N/A 02/26/2017   Procedure: CARDIOVERSION;  Surgeon: Marinus Maw, MD;  Location: [facility] INVASIVE CV LAB;  Service: Cardiovascular;  Laterality: N/A;   CARDIOVERSION N/A 07/24/2017   Procedure: CARDIOVERSION;  Surgeon: Elease Hashimoto Deloris Ping, MD;  Location: [facility] ENDOSCOPY;  Service: Cardiovascular;  Laterality: N/A;   CARDIOVERSION N/A 02/12/2020   Procedure: CARDIOVERSION;  Surgeon: Laurey Morale, MD;  Location: [facility] ENDOSCOPY;  Service: Cardiovascular;  Laterality: N/A;   CARDIOVERSION N/A 09/15/2021   Procedure: CARDIOVERSION;  Surgeon: Laurey Morale, MD;  Location: [facility] ENDOSCOPY;  Service: Cardiovascular;  Laterality: N/A;   CARPAL TUNNEL RELEASE  07/21/2021   COLONOSCOPY N/A 11/08/2017   Procedure: COLONOSCOPY;  Surgeon: Rachael Fee, MD;  Location: [facility] ENDOSCOPY;  Service: Endoscopy;  Laterality: N/A;   COLONOSCOPY WITH PROPOFOL N/A 05/31/2020   Procedure: COLONOSCOPY WITH PROPOFOL;  Surgeon: Hilarie Fredrickson, MD;  Location: [facility] ENDOSCOPY;  Service: Endoscopy;  Laterality: N/A;   COLONOSCOPY WITH PROPOFOL N/A 10/26/2021   Procedure: COLONOSCOPY WITH PROPOFOL;  Surgeon: Imogene Burn, MD;  Location: [facility] ENDOSCOPY;  Service: Gastroenterology;  Laterality: N/A;   DILATION AND CURETTAGE OF UTERUS     ENTEROSCOPY N/A 03/02/2020   Procedure: ENTEROSCOPY;  Surgeon:  Armbruster, Willaim Rayas, MD;  Location: [facility] ENDOSCOPY;  Service: Gastroenterology;  Laterality: N/A;   ENTEROSCOPY N/A 06/08/2022   Procedure: ENTEROSCOPY;  Surgeon: Beverley Fiedler, MD;  Location: [facility] ENDOSCOPY;  Service: Gastroenterology;  Laterality: N/A;   EP IMPLANTABLE DEVICE N/A 10/06/2015   Procedure: BIV ICD Generator Changeout;  Surgeon: Duke Salvia, MD;  Location: [facility] INVASIVE CV LAB;  Service: Cardiovascular;  Laterality: N/A;   ESOPHAGOGASTRODUODENOSCOPY N/A  10/26/2017   Procedure: ESOPHAGOGASTRODUODENOSCOPY (EGD);  Surgeon: Meryl Dare, MD;  Location: [facility] ENDOSCOPY;  Service: Endoscopy;  Laterality: N/A;   ESOPHAGOGASTRODUODENOSCOPY (EGD) WITH PROPOFOL N/A 11/07/2017   Procedure: ESOPHAGOGASTRODUODENOSCOPY (EGD) WITH PROPOFOL;  Surgeon: Rachael Fee, MD;  Location: [facility] ENDOSCOPY;  Service: Endoscopy;  Laterality: N/A;   ESOPHAGOGASTRODUODENOSCOPY (EGD) WITH PROPOFOL N/A 05/29/2020   Procedure: ESOPHAGOGASTRODUODENOSCOPY (EGD) WITH PROPOFOL;  Surgeon: Sherrilyn Rist, MD;  Location: [facility] ENDOSCOPY;  Service: Gastroenterology;  Laterality: N/A;   ESOPHAGOGASTRODUODENOSCOPY (EGD) WITH PROPOFOL N/A 10/26/2021   Procedure: ESOPHAGOGASTRODUODENOSCOPY (EGD) WITH PROPOFOL;  Surgeon: Imogene Burn, MD;  Location: [facility] ENDOSCOPY;  Service: Gastroenterology;  Laterality: N/A;   ESOPHAGOGASTRODUODENOSCOPY (EGD) WITH PROPOFOL N/A 03/08/2022   Procedure: ESOPHAGOGASTRODUODENOSCOPY (EGD) WITH PROPOFOL;  Surgeon: Lynann Bologna, MD;  Location: [facility] ENDOSCOPY;  Service: Gastroenterology;  Laterality: N/A;   GIVENS CAPSULE STUDY N/A 05/19/2020   Procedure: GIVENS CAPSULE STUDY;  Surgeon: Iva Boop, MD;  Location: [facility] ENDOSCOPY;  Service: Endoscopy;  Laterality: N/A;  .adm for obs since pacemaker, PA wil enter order and see pt   GIVENS CAPSULE STUDY N/A 05/29/2020   Procedure: GIVENS CAPSULE STUDY;  Surgeon: Sherrilyn Rist, MD;  Location: [facility] ENDOSCOPY;  Service: Gastroenterology;  Laterality: N/A;   GIVENS CAPSULE STUDY N/A 06/08/2022   Procedure: GIVENS CAPSULE STUDY;  Surgeon: Beverley Fiedler, MD;  Location: [facility] ENDOSCOPY;  Service: Gastroenterology;  Laterality: N/A;   HEMATOMA EVACUATION Left 09/24/2020   Procedure: EVACUATION HEMATOMA ARM;  Surgeon: Larina Earthly, MD;  Location: [facility] OR;  Service: Vascular;  Laterality: Left;   HEMOSTASIS CLIP PLACEMENT  06/21/2022   Procedure: HEMOSTASIS CLIP PLACEMENT;  Surgeon: Shellia Cleverly, DO;  Location: [facility]  ENDOSCOPY;  Service: Gastroenterology;;   HOT HEMOSTASIS N/A 10/26/2017   Procedure: HOT HEMOSTASIS (ARGON PLASMA COAGULATION/BICAP);  Surgeon: Meryl Dare, MD;  Location: [facility] ENDOSCOPY;  Service: Endoscopy;  Laterality: N/A;   HOT HEMOSTASIS N/A 03/02/2020   Procedure: HOT HEMOSTASIS (ARGON PLASMA COAGULATION/BICAP);  Surgeon: Benancio Deeds, MD;  Location: [facility] ENDOSCOPY;  Service: Gastroenterology;  Laterality: N/A;   HOT HEMOSTASIS N/A 05/31/2020   Procedure: HOT HEMOSTASIS (ARGON PLASMA COAGULATION/BICAP);  Surgeon: Hilarie Fredrickson, MD;  Location: [facility] ENDOSCOPY;  Service: Endoscopy;  Laterality: N/A;   HOT HEMOSTASIS N/A 03/08/2022   Procedure: HOT HEMOSTASIS (ARGON PLASMA COAGULATION/BICAP);  Surgeon: Lynann Bologna, MD;  Location: [facility] ENDOSCOPY;  Service: Gastroenterology;  Laterality: N/A;   HOT HEMOSTASIS N/A 06/08/2022   Procedure: HOT HEMOSTASIS (ARGON PLASMA COAGULATION/BICAP);  Surgeon: Beverley Fiedler, MD;  Location: [facility] ENDOSCOPY;  Service: Gastroenterology;  Laterality: N/A;   HOT HEMOSTASIS N/A 06/21/2022   Procedure: HOT HEMOSTASIS (ARGON PLASMA COAGULATION/BICAP);  Surgeon: Shellia Cleverly, DO;  Location[facility] ENDOSCOPY;  Service: Gastroenterology;  Laterality: N/A;   ICD GENERATOR CHANGEOUT N/A 07/21/2022   Procedure: ICD GENERATOR CHANGEOUT;  Surgeon: Duke Salvia, MD;  Location: [facility] INVASIVE CV LAB;  Service: Cardiovascular;  Laterality: N/A;   INSERT / REPLACE / REMOVE PACEMAKER  ?2008  IR PERC TUN PERIT CATH WO PORT S&I /IMAG  07/05/2020   PERIPHERAL VASCULAR INTERVENTION Left 03/28/2021   Procedure: PERIPHERAL VASCULAR INTERVENTION;  Surgeon: Maeola Harman, MD;  Location: [facility] INVASIVE CV LAB;  Service: Cardiovascular;  Laterality: Left;   POLYPECTOMY  11/08/2017   Procedure: POLYPECTOMY;  Surgeon: Rachael Fee, MD;  Location: [facility] ENDOSCOPY;  Service: Endoscopy;;   PROCTOSCOPY N/A 05/22/2018   Procedure: RIGID PROCTOSCOPY;  Surgeon: Karie Soda, MD;   Location: [facility] ORS;  Service: General;  Laterality: N/A;   RIGHT HEART CATH N/A 02/13/2018   Procedure: RIGHT HEART CATH;  Surgeon: Laurey Morale, MD;  Location: [facility] INVASIVE CV LAB;  Service: Cardiovascular;  Laterality: N/A;   RIGHT HEART CATH N/A 06/23/2020   Procedure: RIGHT HEART CATH;  Surgeon: Laurey Morale, MD;  Location: [facility] INVASIVE CV LAB;  Service: Cardiovascular;  Laterality: N/A;   RIGHT/LEFT HEART CATH AND CORONARY ANGIOGRAPHY N/A 12/11/2019   Procedure: RIGHT/LEFT HEART CATH AND CORONARY ANGIOGRAPHY;  Surgeon: Laurey Morale, MD;  Location: [facility] INVASIVE CV LAB;  Service: Cardiovascular;  Laterality: N/A;   SUBMUCOSAL TATTOO INJECTION  03/02/2020   Procedure: SUBMUCOSAL TATTOO INJECTION;  Surgeon: Benancio Deeds, MD;  Location: [facility] ENDOSCOPY;  Service: Gastroenterology;;   SUBMUCOSAL TATTOO INJECTION  05/31/2020   Procedure: SUBMUCOSAL TATTOO INJECTION;  Surgeon: Hilarie Fredrickson, MD;  Location: [facility] ENDOSCOPY;  Service: Endoscopy;;   SUBMUCOSAL TATTOO INJECTION  06/21/2022   Procedure: SUBMUCOSAL TATTOO INJECTION;  Surgeon: Shellia Cleverly, DO;  Location: [facility] ENDOSCOPY;  Service: Gastroenterology;;   TEE WITHOUT CARDIOVERSION N/A 08/02/2021   Procedure: TRANSESOPHAGEAL ECHOCARDIOGRAM (TEE);  Surgeon: Laurey Morale, MD;  Location: [facility] ENDOSCOPY;  Service: Cardiovascular;  Laterality: N/A;   TUBAL LIGATION     XI ROBOTIC ASSISTED LOWER ANTERIOR RESECTION N/A 05/22/2018   Procedure: XI ROBOTIC ASSISTED SIGMOID COLOECTOMY MOBILIZATION OF SPLENIC FLEXURE, FIREFLY ASSESSMENT OF PERFUSION;  Surgeon: Karie Soda, MD;  Location: [facility] ORS;  Service: General;  Laterality: N/A;  ERAS PATHWAY     Social History:   reports that she has never smoked. She has never used smokeless tobacco. She reports that she does not currently use alcohol. She reports that she does not currently use drugs after having used the following drugs: Marijuana.   Family History:  Her family history includes  Asthma in her father and sister; Heart attack in her father and mother; Lung cancer in her sister; Stroke in her brother. There is no history of Colon cancer, Esophageal cancer, Pancreatic cancer, Stomach cancer, or Liver disease.   Allergies No Known Allergies   Home Medications  Prior to Admission medications   Medication Sig Start Date End Date Taking? Authorizing Provider  albuterol (VENTOLIN HFA) 108 (90 Base) MCG/ACT inhaler Inhale 2 puffs into the lungs every 6 (six) hours as needed for wheezing or shortness of breath. 04/07/22   Coleta Grosshans, Lesia Sago, MD  amiodarone (PACERONE) 200 MG tablet Take 1 tablet by mouth once daily 08/30/22   Saguier, Ramon Dredge, PA-C  ammonium lactate (LAC-HYDRIN) 12 % lotion Apply daily on forearms. 09/05/22   Sharlene Dory, DO  amoxicillin-clavulanate (AUGMENTIN) 875-125 MG tablet Take 1 tablet by mouth 2 (two) times daily. 09/28/22   Clayborne [name], NP  apixaban (ELIQUIS) 2.5 MG TABS tablet Take 1 tablet (2.5 mg total) by mouth 2 (two) times daily. 03/11/22   Pokhrel, Rebekah Chesterfield, MD  cyclobenzaprine (FLEXERIL) 5 MG tablet Take 5 mg by mouth at bedtime as needed for muscle  spasms. 12/07/21   [provider]  gabapentin (NEURONTIN) 300 MG capsule Take 1 capsule (300 mg total) by mouth daily. Fill one rx. Computer may have sent over 2 Patient taking differently: Take 300 mg by mouth daily. 01/10/22   Saguier, Ramon Dredge, PA-C  HYDROcodone bit-homatropine (HYCODAN) 5-1.5 MG/5ML syrup Take 5 mLs by mouth every 8 (eight) hours as needed for up to 5 days for cough. 09/28/22 10/03/22  Clayborne [name], NP  ipratropium (ATROVENT HFA) 17 MCG/ACT inhaler Inhale 2 puffs into the lungs every 6 (six) hours as needed for wheezing. 03/24/22   Saguier, Ramon Dredge, PA-C  levothyroxine (SYNTHROID) 125 MCG tablet Take 1 tablet (125 mcg total) by mouth daily before breakfast. 01/16/22   Saguier, Ramon Dredge, PA-C  lidocaine-prilocaine (EMLA) cream Apply 1 Application topically as needed.     [provider]  methylPREDNISolone (MEDROL) 4 MG tablet Standard 6 day taper 09/28/22   Clayborne [name], NP  nitroGLYCERIN (NITROSTAT) 0.4 MG SL tablet Place 1 tablet (0.4 mg total) under the tongue every 5 (five) minutes as needed for chest pain. Patient not taking: Reported on 09/28/2022 10/27/21   Leroy Sea, MD  triamcinolone cream (KENALOG) 0.1 % Apply to shins twice dialy for 7 days. 09/05/22   Sharlene Dory, DO  Vitamin D, Ergocalciferol, (DRISDOL) 1.25 MG (50000 UNIT) CAPS capsule Take 1 capsule (50,000 Units total) by mouth every 7 (seven) days. 09/06/22   Sharlene Dory, DO     Critical care time:     CRITICAL CARE Performed by: Karren Burly   Total critical care time: 33 minutes  Critical care time was exclusive of separately billable procedures and treating other patients.  Critical care was necessary to treat or prevent imminent or life-threatening deterioration.  Critical care was time spent personally by me on the following activities: development of treatment plan with patient and/or surrogate as well as nursing, discussions with consultants, evaluation of patient's response to treatment, examination of patient, obtaining history from patient or surrogate, ordering and performing treatments and interventions, ordering and review of laboratory studies, ordering and review of radiographic studies, pulse oximetry and re-evaluation of patient's condition.  Karren Burly, MD [facility] Pulmonary/Critical Care See Amion OR # [phone] if no answer

## 2022-10-08 NOTE — Procedures (Signed)
Intubation Procedure Note  Morgan Clay  [id number]  07/05/1945  Date:10/08/22  Time:7:03 PM   Provider Performing:Pete E Tanja Port    Procedure: Intubation (31500)  Indication(s) Respiratory Failure  Consent Unable to obtain consent due to emergent nature of procedure.   Anesthesia Etomidate and Rocuronium   Time Out Verified patient identification, verified procedure, site/side was marked, verified correct patient position, special equipment/implants available, medications/allergies/relevant history reviewed, required imaging and test results available.   Sterile Technique Usual hand hygeine, masks, and gloves were used   Procedure Description Patient positioned in bed supine.  Sedation given as noted above.  Patient was intubated with endotracheal tube using Glidescope.  View was Grade 2 only posterior commissure .  Number of attempts was 1.  Colorimetric CO2 detector was consistent with tracheal placement.   Complications/Tolerance None; patient tolerated the procedure well. Chest X-ray is ordered to verify placement.   EBL Minimal   Specimen(s) None

## 2022-10-08 NOTE — Progress Notes (Signed)
[facility] Progress Note  Subjective: pt seen in ICU, per RN plan is to extubate soon. No new issues overnight.   Vitals:   10/08/22 1000 10/08/22 1025 10/08/22 1030 10/08/22 1032  BP: (!) 155/63  (!) 138/52   Pulse: 89  87 89  Resp: (!) 37  (!) 21 (!) 22  Temp:      TempSrc:      SpO2: 99% 99% 96% 99%  Weight:        Exam: Gen on vent, sedated throat w/ ETT No jvd or bruits Chest clear anterior/ lateral RRR no RG Abd soft ntnd no mass or ascites +bs Ext no LE edema Neuro is on vent, sedated   RIJ TDC in place      Home meds include - albuterol, amiodarone, eliquis, gabapentin, hydrocodone prn, synthroid 125 mcg, sl ntg prn, prn's/ vits/ supps      OP HD: SW MWF 3.5h  400/600   58.5kjg  2/2 bath  RIJ TDC  Heparin none - last HD 5/27, post wt 57.9kg - sensipar 30mg  po tiw - hectorol 4 mcg IV tiw    CTA chest  5/17 - IMPRESSION: 1. No pulmonary embolus allowing for motion artifact limitations. 2. Multi chamber cardiomegaly with reflux of contrast into the hepatic veins and IVC consistent with elevated right heart pressures. 3. Mosaic attenuation of the pulmonary parenchyma, can be seen with small airways disease or small vessel disease.    Resp panel by CPR --> + for rhinovirus/ enterovirus    CXR 5/30 - IMPRESSION: 1. Increased scattered bilateral patchy opacities, likely multifocal infection/inflammation.   Assessment/ Plan: Hypoxemic resp failure/ multifocal pna - was on 12 L HFNC initially then seemed to be improving but had code blue yest w/ CPR and intubation and ROSC at 5 min. F/u CXR showed increased bilat patchy opacities, likely multifocal infection/ inflammation. Bronch yesterday also showed pus/ tenacious secretions. Is on IV abx per CCM.  ID - pna as above, also resp panel + for rhinovirus/ enterovirus Abd pain -  seen by gen surg, repeat abd CT negative. Surg signed off.  ESRD - on HD MWF. Last HD Friday. Next HD tomorrow.  HTN/ volume - pt  was looking dry yest w/ soft BP's and 3 kg under dry wt so IVF"s were started at 75 cc/hr. BP's have improved/ come up, still 3kg under. Will cont IVF"s for another 24 hrs. Keep even next HD.  Anemia esrd - Hb > 10 here here, no esa needs MBD ckd - CCa and phos in range. Cont po sensipar and IV vdra w/ HD.  H/o pHTN H/o atrial fib   Vinson Moselle MD [facility] 10/08/2022, 9:05 AM  Recent Labs  Lab 10/07/22 0710 10/07/22 1304 10/07/22 1755 10/08/22 0553  HGB 13.0  --   --  11.4*  ALBUMIN 3.5  --   --  2.9*  CALCIUM 8.7*  --   --  8.1*  PHOS  --    < > 5.9* 6.1*  CREATININE 4.65*  --   --  6.52*  K 5.2*  --   --  3.9   < > = values in this interval not displayed.    No results for input(s): "IRON", "TIBC", "FERRITIN" in the last 168 hours. Inpatient medications:  Chlorhexidine Gluconate Cloth  6 each Topical Q0600   docusate  100 mg Per Tube BID   doxercalciferol  4 mcg Intravenous Q M,W,F-HD   famotidine  10 mg Per  Tube Daily   feeding supplement (PROSource TF20)  60 mL Per Tube Daily   insulin aspart  0-15 Units Subcutaneous Q4H   ipratropium-albuterol  3 mL Nebulization Q6H   levothyroxine  125 mcg Per Tube Q0600   multivitamin  1 tablet Per Tube QHS   mouth rinse  15 mL Mouth Rinse Q2H   polyethylene glycol  17 g Per Tube Daily   thiamine  100 mg Per Tube Daily    sodium chloride Stopped (10/07/22 1027)   sodium chloride 75 mL/hr at 10/08/22 1000   feeding supplement (VITAL 1.5 CAL) Stopped (10/08/22 0981)   norepinephrine (LEVOPHED) Adult infusion Stopped (10/06/22 2334)   piperacillin-tazobactam (ZOSYN)  IV Stopped (10/08/22 0852)   docusate sodium, fentaNYL (SUBLIMAZE) injection, fentaNYL (SUBLIMAZE) injection, mineral oil-hydrophilic petrolatum, mouth rinse

## 2022-10-08 NOTE — Progress Notes (Signed)
RT attempted x2 to place arterial line without success. RN made aware.

## 2022-10-08 NOTE — Progress Notes (Addendum)
eLink Physician-Brief Progress Note Patient Name: Zilda Bomberger DOB: 1945-07-30 MRN: [id number]   Date of Service  10/08/2022  HPI/Events of Note  77 year old female with a history of heart failure, pulm hypertension, end-stage renal disease and cirrhosis who initially presented to an outside hospital with shortness of breath after dialysis.  She had a PEA cardiac arrest and is now in circulatory shock with biventricular failure and respiratory failure requiring intubation.  She also has a traumatic pneumothorax.  Receiving peripheral norepinephrine, but has a blood pressure cuff from the same arm.  Has aVF on the other side with limb restriction. Troponins have been elevated greater than 24,000.  eICU Interventions  Continue to monitor troponins, likely secondary to cardiac arrest  Place A-line.  Will discuss central placement with ground team   2258 -central line placement held by ground team.  Currently off of nor epi.  RT unable to place arterial line.  For now, we will continue to monitor without additional lines, but may benefit from reevaluation in the morning.   Intervention Category Intermediate Interventions: Hypotension - evaluation and management  Miakoda Mcmillion 10/08/2022, 8:44 PM

## 2022-10-08 NOTE — Progress Notes (Signed)
An USGPIV (ultrasound guided PIV) has been placed for short-term vasopressor infusion. A correctly placed ivWatch must be used when administering Vasopressors. Should this treatment be needed beyond 72 hours, central line access should be obtained.  It will be the responsibility of the bedside nurse to follow best practice to prevent extravasations.   

## 2022-10-08 NOTE — Progress Notes (Signed)
Patient still having increased WOB, now progressing to agonal breathing. Respiratory rate in the 30's-40's. No change in mental status. Patient with weak cough unable to cough up secretions. Patient also complaining of Right sided Rib / Chest Pain. CCM MD notified and asked if wanted to order ABG, and requested MD to come to bedside.

## 2022-10-08 NOTE — Progress Notes (Signed)
Notified by nursing staff earlier in the day about rising troponins post extubation.  Patient's been reporting right-sided chest discomfort and increasing work of breathing over the course of the day. Repeat twelve-lead was obtained  This showed wide QRS complex with intermittently paced rhythm, I suspect some of this reflects demand ischemia.  A CBC was ordered and I had planned on starting back her IV heparin.  On my arrival the patient was obtunded, with agonal type respiratory effort barely maintaining pulse oximetry.  Family member was at bedside and we proceeded with intubation. Impression  Recurrent acute hypoxic respiratory failure.  Initially from viral pneumonia and asthmatic exacerbation complicated by aspiration event.  Now I think more likely progressive atelectasis secondary to rib fractures post CPR Postintubation hypotension Elevated troponins Plan Continue full ventilator support PAD protocol RASS goal -1 Repeating CBC will need to decide on whether or not we start anticoagulation however nursing reports black stools earlier today I suspect simply addressing her work of breathing should improve her cardiac demand Repeat troponins Norepinephrine for mean arterial pressure greater than 65   My time 35 minutes  Simonne Martinet ACNP-BC [facility] Pulmonary/Critical Care Pager # [phone] OR # [phone] if no answer

## 2022-10-08 NOTE — Progress Notes (Signed)
Pt had one small dark black BM. CCM MD notified, and asked MD if wanted to order occult stool sample.

## 2022-10-09 ENCOUNTER — Inpatient Hospital Stay (HOSPITAL_COMMUNITY): Payer: 59

## 2022-10-09 DIAGNOSIS — I469 Cardiac arrest, cause unspecified: Secondary | ICD-10-CM | POA: Diagnosis not present

## 2022-10-09 DIAGNOSIS — J9601 Acute respiratory failure with hypoxia: Secondary | ICD-10-CM | POA: Diagnosis not present

## 2022-10-09 DIAGNOSIS — I5043 Acute on chronic combined systolic (congestive) and diastolic (congestive) heart failure: Secondary | ICD-10-CM

## 2022-10-09 LAB — HEPARIN LEVEL (UNFRACTIONATED): Heparin Unfractionated: <0.1 IU/mL — ABNORMAL LOW (ref 0.30–0.70)

## 2022-10-09 LAB — CBC
HCT: 33.1 % — ABNORMAL LOW (ref 36.0–46.0)
HCT: 31.6 % — ABNORMAL LOW (ref 36.0–46.0)
Hemoglobin: 10.3 g/dL — ABNORMAL LOW (ref 12.0–15.0)
Hemoglobin: 10.2 g/dL — ABNORMAL LOW (ref 12.0–15.0)
MCH: 28.5 pg (ref 26.0–34.0)
MCH: 28.7 pg (ref 26.0–34.0)
MCHC: 31.1 g/dL (ref 30.0–36.0)
MCHC: 32.3 g/dL (ref 30.0–36.0)
MCV: 91.4 fL (ref 80.0–100.0)
MCV: 89 fL (ref 80.0–100.0)
Platelets: 57 10*3/uL — ABNORMAL LOW (ref 150–400)
Platelets: 56 10*3/uL — ABNORMAL LOW (ref 150–400)
RBC: 3.62 MIL/uL — ABNORMAL LOW (ref 3.87–5.11)
RBC: 3.55 MIL/uL — ABNORMAL LOW (ref 3.87–5.11)
RDW: 25.6 % — ABNORMAL HIGH (ref 11.5–15.5)
RDW: 25.4 % — ABNORMAL HIGH (ref 11.5–15.5)
WBC: 10.4 10*3/uL (ref 4.0–10.5)
WBC: 13.3 10*3/uL — ABNORMAL HIGH (ref 4.0–10.5)
nRBC: 15.2 % — ABNORMAL HIGH (ref 0.0–0.2)
nRBC: 17.1 % — ABNORMAL HIGH (ref 0.0–0.2)

## 2022-10-09 LAB — APTT
aPTT: 51 seconds — ABNORMAL HIGH (ref 24–36)
aPTT: 127 seconds — ABNORMAL HIGH (ref 24–36)

## 2022-10-09 LAB — COMPREHENSIVE METABOLIC PANEL
ALT: 303 U/L — ABNORMAL HIGH (ref 0–44)
AST: 220 U/L — ABNORMAL HIGH (ref 15–41)
Albumin: 2.6 g/dL — ABNORMAL LOW (ref 3.5–5.0)
Alkaline Phosphatase: 92 U/L (ref 38–126)
Anion gap: 20 — ABNORMAL HIGH (ref 5–15)
BUN: 80 mg/dL — ABNORMAL HIGH (ref 8–23)
CO2: 22 mmol/L (ref 22–32)
Calcium: 7.6 mg/dL — ABNORMAL LOW (ref 8.9–10.3)
Chloride: 100 mmol/L (ref 98–111)
Creatinine, Ser: 7.5 mg/dL — ABNORMAL HIGH (ref 0.44–1.00)
GFR, Estimated: 5 mL/min — ABNORMAL LOW (ref >60)
Glucose, Bld: 162 mg/dL — ABNORMAL HIGH (ref 70–99)
Potassium: 3.9 mmol/L (ref 3.5–5.1)
Sodium: 142 mmol/L (ref 135–145)
Total Bilirubin: 7.1 mg/dL — ABNORMAL HIGH (ref 0.3–1.2)
Total Protein: 7.1 g/dL (ref 6.5–8.1)

## 2022-10-09 LAB — GLUCOSE, CAPILLARY
Glucose-Capillary: 44 mg/dL — CL (ref 70–99)
Glucose-Capillary: 149 mg/dL — ABNORMAL HIGH (ref 70–99)
Glucose-Capillary: 155 mg/dL — ABNORMAL HIGH (ref 70–99)
Glucose-Capillary: 109 mg/dL — ABNORMAL HIGH (ref 70–99)
Glucose-Capillary: 159 mg/dL — ABNORMAL HIGH (ref 70–99)
Glucose-Capillary: 159 mg/dL — ABNORMAL HIGH (ref 70–99)
Glucose-Capillary: 173 mg/dL — ABNORMAL HIGH (ref 70–99)

## 2022-10-09 LAB — CULTURE, RESPIRATORY W GRAM STAIN

## 2022-10-09 MED ORDER — ORAL CARE MOUTH RINSE
15.0000 mL | OROMUCOSAL | Status: DC
  Administered 2022-10-09 – 2022-10-16 (×83): 15 mL via OROMUCOSAL

## 2022-10-09 MED ORDER — HEPARIN SODIUM (PORCINE) 1000 UNIT/ML IJ SOLN
INTRAMUSCULAR | Status: AC
  Administered 2022-10-09: 3800 [IU]
  Filled 2022-10-09: qty 4

## 2022-10-09 MED ORDER — PANTOPRAZOLE SODIUM 40 MG IV SOLR
40.0000 mg | Freq: Two times a day (BID) | INTRAVENOUS | Status: DC
  Administered 2022-10-09 – 2022-10-16 (×14): 40 mg via INTRAVENOUS
  Filled 2022-10-09 (×15): qty 10

## 2022-10-09 MED ORDER — ORAL CARE MOUTH RINSE: 15.0000 mL | OROMUCOSAL | Status: DC | PRN

## 2022-10-09 MED ORDER — HEPARIN (PORCINE) 25000 UT/250ML-% IV SOLN
400.0000 [IU]/h | INTRAVENOUS | Status: AC
  Administered 2022-10-09: 550 [IU]/h via INTRAVENOUS
  Filled 2022-10-09: qty 250

## 2022-10-09 NOTE — Progress Notes (Signed)
SLP Cancellation Note  Patient Details Name: Morgan Clay MRN: [id number] DOB: 10/05/1945   Cancelled treatment:       Reason Eval/Treat Not Completed: Medical issues which prohibited therapy (Pt re-intubated subsequent to SLP order for swallow evaluation on 6/2. SLP will sign off at this time, but please re-consult when clinically indicated.)  Emmaline Wahba I. Vear Clock, MS, CCC-SLP Neuro Diagnostic Specialist  Acute Rehabilitation Services Office number: [phone]  Scheryl Marten 10/09/2022, 8:23 AM

## 2022-10-09 NOTE — Progress Notes (Signed)
NAME:  Morgan Clay, MRN:  [id number], DOB:  Mar 07, 1946, LOS: 7 ADMISSION DATE:  10/02/2022, CONSULTATION DATE:  10/02/22 REFERRING MD:  Ernie Avena, MD CHIEF COMPLAINT:  Respiratory Failure  History of Present Illness:  Morgan Clay is a 77 year old woman, never smoker with history of heart failure, pulmonary hypertension, asthma, ESRD on HD, cirrhosis secondary to hepatitis C s/p Harvoni treatment, atrial fibrillation and esophageal varices/small bowel AVMs who presented to [facility] with shortness of breath after dialysis, she was tachypneic with an SpO2 on arrival in the ER.   She was noted to have Lactic acid of 7.7 and was given a bolus with repeat lactic acid 7.6. Due to increase work of breathing she was placed on non-rebreather mask. She was given duoneb and solumedrol 125mg  IV. CTA Chest was negative for PE, there was motion artifact but mosaic attenuation of the pulmonary parenchyma. CT Neck showed possible mild pharyngeal mucosal swelling, but again difficult due to motion artifact. Neck CT was done for neck swelling. CT abdomen done for abdominal pain with no acute findings.   PCCM consulted as patient remained tachypneic with increased work of breathing after transfer from non-rebreather to HFNC.   Patient accepted in transfer from [facility] to [facility] ICU.  Pertinent  Medical History   Asthma Pulmonary Hypertension OSA Chronic Systolic Heart Failure Atrial Fibrillation DMII ESRD on HD Cirrhosis Hepatitis C s/p Harvoni Esophageal Varices Duodenal AVM  Significant Hospital Events: Including procedures, antibiotic start and stop dates in addition to other pertinent events   5/27 presented to [facility], transferred to [facility] 5/28 remains on 12L, stable, viral panel + for rhinovirus/Enterovirus  5/29 lactic improving, surgery following serial abdominal exams, down to 4L [place].  5/30 down to room air. Initial resp failure felt  2/2 rhinovirus pneumonitis. Lactate cleared felt 2/2 generalized multiorgan failure which was slow to clear d/t cirrhosis. Remained encephalopathic  5/31 surgery cleared for diet and signed off. Went to [employer]. Completed HD was found by family unresponsive. Not initially on tele. Staff called to room. She had no pulse. Rhythm PEA. Underwent 5 minutes of ACLS 1 round epi before ROSC. Did not regain LOC so intubated and transferred to ICU. Copious oral secretions noted during intubation 6/2 extubated and required reintubation later the same day  Interim History / Subjective:  Reintubated 6/2 after extubation attempt due to pain, hypoventilation, encephalopathy. Getting HD this am. Minimal responsive, not on sedation. On norepinephrine 6 mcg this am.   Objective   Blood pressure (!) 99/51, pulse 74, temperature 98.1 F (36.7 C), resp. rate (!) 26, weight 54.8 kg, SpO2 99 %.    Vent Mode: PRVC FiO2 (%):  [30 %-40 %] 40 % Set Rate:  [24 bmp-30 bmp] 30 bmp Vt Set:  [350 mL] 350 mL PEEP:  [5 cmH20] 5 cmH20 Pressure Support:  [10 cmH20] 10 cmH20 Plateau Pressure:  [20 cmH20-23 cmH20] 21 cmH20   Intake/Output Summary (Last 24 hours) at 10/09/2022 1004 Last data filed at 10/09/2022 0800 Gross per 24 hour  Intake 1826.64 ml  Output --  Net 1826.64 ml   Filed Weights   10/08/22 0447 10/09/22 0500 10/09/22 0815  Weight: 56.3 kg 54.8 kg 54.8 kg   Gen:      Intubated, sedated, acutely ill appearing HEENT:  ETT to vent Lungs:    sounds of mechanical ventilation auscultated, no wheeze CV:         RRR, tunneled right  chest wall line Abd:      + bowel sounds; soft, non-tender; no palpable masses, no distension Ext:    No edema, RUE AVF with bruit Skin:      Warm and dry; no rashes Neuro:   not sedated, not responsive   Resolved Hospital Problem list     Lactic acidosis hyperkalemia  Assessment & Plan:   Acute hypoxic Respiratory Failure s/p PEA arrest c/b aspiration PNA and rib fractures  5/31.superimposed on recent  viral pneumonia and asthma exacerbation and h/o OSA and PH.  Traumatic pneumothorax following CPR Pseudomonas Pneumonia Had been weaned to room air prior to transfer to ICU Plan Continue pseudomonas coverage for 7 days, narrow form zosyn as susceptibilites return Pneumothorax seems to be resolving/decreasing spontaneously.   Circulatory Shock s/p cardiac arrest w/ known Pulmonary Hypertension,  LVH, HFrEF (40-45%), right heart failure and aortic route insuff. History of BiV-ICD with CRT.  PEA cardiac arrest in hospital 5/31. Time to ROSC 5 minutes.  Troponin >24,000 Paroxysmal Atrial fib  Plan  Was found unresponsive by family member on the floor. Patient was not on tele.  Echocardiogram shows LVEF 40-45% On intermittent vasopressors Unclear etiology of in hospital arrest. Start heparin, no bolus based on concern for NSTEMI while in hospital peri-cardiac arrest. Could this have been the reason for arrest? She was therapeutic on heparin at the time of arrest.  Will consult cardiology as well given likely NSTEMI.  - off coreg and bidil per Heart Failure team from an earlier admission due to hypotension - currently in NSR, paced. On amiodarone at home which is held due to transaminitis.   Anemia of Chronic Disease Melenontic stools Plan Cbc Transfuse for hgb < 7  Not requiring transfusions at this time. Will add BID PPI and stop H2 blocker  Acute metabolic encephalopathy now w/ concern for anoxic brain injury s/p arrest has had on-going delirium,  Was confused 5/31 not following commands CT head was neg. Ammonia was nml  Plan PAD protocol Hold all sedation  Type 2 DM - Resume tube feeds this am - continue SSI, may need to add long acting once these are at goal.   Abdominal Pain Cirrhosis Elevated Liver Enzymes - initial CTA without acute pathology - Liver enzymes elevated in setting of hepatic congestion due to Right heart failure and  hypoxemia TFs  ESRD on HD Plan Nephro following HD 6/3 with some vasopressors.   Best Practice (right click and "Reselect all SmartList Selections" daily)   Diet/type: NPO w/ meds via tube DVT prophylaxis: SCD GI prophylaxis: N/A Lines: N/A Foley:  N/A Code Status:  full code Last date of multidisciplinary goals of care discussion [daughter updated 6/1 at bedside.]   CRITICAL CARE  The patient is critically ill due to shock., respiratory failure.  Critical care was necessary to treat or prevent imminent or life-threatening deterioration.  Critical care was time spent personally by me on the following activities: development of treatment plan with patient and/or surrogate as well as nursing, discussions with consultants, evaluation of patient's response to treatment, examination of patient, obtaining history from patient or surrogate, ordering and performing treatments and interventions, ordering and review of laboratory studies, ordering and review of radiographic studies, pulse oximetry, re-evaluation of patient's condition and participation in multidisciplinary rounds.   Critical Care Time devoted to patient care services described in this note is 35 minutes. This time reflects time of care of this signee Charlott Holler . This critical care time does  not reflect separately billable procedures or procedure time, teaching time or supervisory time of PA/NP/Med student/Med Resident etc but could involve care discussion time.       Charlott Holler [place] 10/09/2022 10:04 AM  Pager: see AMION  If no response to pager , please call critical care on call (see AMION) until 7pm After 7:00 pm call Elink

## 2022-10-09 NOTE — Progress Notes (Signed)
   Inpatient Rehab Admissions Coordinator :  Per therapy recommendations patient was screened for CIR candidacy by Lakai Moree RN MSN. Patient is not yet at a level to tolerate the intensity required to pursue a CIR admit . Patient may have the potential to progress to become a candidate. The CIR admissions team will follow and monitor for progress and place a Rehab Consult order if felt to be appropriate. Please contact me with any questions.  Nyeemah Jennette RN MSN Admissions Coordinator [phone]  

## 2022-10-09 NOTE — Progress Notes (Signed)
ANTICOAGULATION CONSULT NOTE  Pharmacy Consult for IV Heparin Indication: chest pain/ACS  No Known Allergies  Patient Measurements: Weight: 54.8 kg (120 lb 13 oz) Heparin Dosing Weight: 54.8 kg  Vital Signs: Temp: 98 F (36.7 C) (06/03 1544) Temp Source: Axillary (06/03 1544) BP: 110/39 (06/03 1800) Pulse Rate: 71 (06/03 1715)  Labs: Recent Labs    10/07/22 0710 10/08/22 0553 10/08/22 1644 10/08/22 1817 10/08/22 2006 10/09/22 0034 10/09/22 1108 10/09/22 1833  HGB 13.0 11.4*  --  11.0*  --  10.3*  --  10.2*  HCT 40.7 35.0*  --  34.0*  --  33.1*  --  31.6*  PLT 92* 69*  --  60*  --  57*  --  56*  APTT  --   --   --   --   --   --  51* 127*  HEPARINUNFRC  --   --   --   --   --   --  <0.10*  --   CREATININE 4.65* 6.52*  --   --   --  7.50*  --   --   TROPONINIHS 12,808*  --  >24,000*  --  >24,000*  --   --   --      Estimated Creatinine Clearance: 5 mL/min (A) (by C-G formula based on SCr of 7.5 mg/dL (H)).   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) 09/24/2020   Chronic obstructive pulmonary disease (HCC)    Chronic systolic congestive heart failure (HCC)    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 (HCV) (HCC)    HEPATITIS C - s/p treatment with Harvoni, saw hepatology, Dawn Drazek   Diabetes [facility])    Diverticulitis of left colon status post robotic low anterior to sigmoid resection 05/22/2018 07/31/2017   Duodenal arteriovenous  malformation    End stage renal disease (HCC) 03/22/2021   Esophageal varices (HCC)    Glaucoma    Gout    History of blood transfusion ~ 11/2017   Hypertension associated with diabetes (HCC) 06/07/2017   Hypothyroidism    Internal hemorrhoids    LBBB (left bundle branch block)    Malnutrition of moderate degree 07/09/2020   MDD (major depressive disorder), recurrent, in partial remission (HCC) 06/07/2017   OBSTRUCTIVE SLEEP APNEA 11/14/2007   no CPAP   OSTEOPENIA 09/30/2008   Persistent atrial fibrillation (HCC) 02/06/2020   Polyneuropathy associated with underlying disease (HCC) 06/09/2021   Pulmonary hypertension (HCC) on echocardiogram 01/14/2018   Secondary esophageal varices without bleeding (HCC) 12/23/2020   Formatting of this note might be different from the original. Patient had trace esophageal varices during small bowel enteroscopy in October 2021, follow-up EGD in January 2022 with no varices.  She previously had GI bleeding thought to be from AVM versus GAVE.  She is no longer on carvedilol and will need surveillance endoscopy.  Patient was previously followed at [facility] and I have asked her  Secondary hypercoagulable state (HCC) 02/06/2020   Type 2 diabetes, controlled, with renal manifestation (HCC) 11/24/2013    Medications:  Medications Prior to Admission  Medication Sig Dispense Refill Last Dose   albuterol (VENTOLIN HFA) 108 (90 Base) MCG/ACT inhaler Inhale 2 puffs into the lungs every 6 (six) hours as needed for wheezing or shortness of breath. 8 g 6 Past Week   amiodarone (PACERONE) 200 MG tablet Take 1 tablet by mouth once daily 90 tablet 0 Past Week   ammonium lactate (LAC-HYDRIN) 12 % lotion Apply daily on forearms. 400 g 0 Past Week   amoxicillin-clavulanate (AUGMENTIN) 875-125 MG tablet Take 1 tablet by mouth 2 (two) times daily. 20 tablet 0 10/02/2022   apixaban (ELIQUIS) 2.5 MG TABS tablet Take 1 tablet (2.5 mg total) by mouth 2 (two) times daily. 60  tablet 11 Past Week at 18:00   [EXPIRED] HYDROcodone bit-homatropine (HYCODAN) 5-1.5 MG/5ML syrup Take 5 mLs by mouth every 8 (eight) hours as needed for up to 5 days for cough. 75 mL 0 10/02/2022   levothyroxine (SYNTHROID) 125 MCG tablet Take 1 tablet (125 mcg total) by mouth daily before breakfast. 30 tablet 11 10/02/2022   lidocaine-prilocaine (EMLA) cream Apply 1 Application topically as needed.   10/02/2022   methylPREDNISolone (MEDROL) 4 MG tablet Standard 6 day taper 21 tablet 0 10/02/2022   Vitamin D, Ergocalciferol, (DRISDOL) 1.25 MG (50000 UNIT) CAPS capsule Take 1 capsule (50,000 Units total) by mouth every 7 (seven) days. 12 capsule 0 Past Week   ipratropium (ATROVENT HFA) 17 MCG/ACT inhaler Inhale 2 puffs into the lungs every 6 (six) hours as needed for wheezing. (Patient not taking: Reported on 10/03/2022) 12.9 g 1 Not Taking   nitroGLYCERIN (NITROSTAT) 0.4 MG SL tablet Place 1 tablet (0.4 mg total) under the tongue every 5 (five) minutes as needed for chest pain. 25 tablet 0 unknown   triamcinolone cream (KENALOG) 0.1 % Apply to shins twice dialy for 7 days. 30 g 0 unknown    Assessment: 77 years age female s/p cardiac arrest with elevated trop >24K. Platelets noted to be 57 (trend down). Noted dark stools overnight. Discussed with Dr. Celine Mans - starting IV heparin for ACS without bolus due to bleed risk. Dosing via aPTT given elevated Tbili (falsely lowers heparin levels).  Initial aPTT is slightly above goal at 127 seconds, CBC stable.  Goal of Therapy:  Heparin level 0.3-0.7 units/ml Monitor platelets by anticoagulation protocol: Yes   Plan:  Reduce heparin to 450 units/h Recheck aPTT in 8h  Fredonia Highland, PharmD, [place], [facility] Clinical Pharmacist [phone] Please check AMION for all [facility] Pharmacy numbers 10/09/2022

## 2022-10-09 NOTE — Progress Notes (Signed)
Pt has limited veins for IV access with left arm restriction. Recommend considering CVC for any future needs.

## 2022-10-09 NOTE — Progress Notes (Signed)
   10/09/22 1210  Vitals  Temp 98.1 F (36.7 C)  Pulse Rate 65  Resp (!) 25  BP (!) 131/55  SpO2 100 %  O2 Device Ventilator  Weight 54.8 kg  Type of Weight Post-Dialysis  Oxygen Therapy  Patient Activity (if Appropriate) In bed  Pulse Oximetry Type Continuous  Oximetry Probe Site Changed No  FiO2 (%) 40 %  Post Treatment  Dialyzer Clearance Lightly streaked  Duration of HD Treatment -hour(s) 3.5 hour(s)  Hemodialysis Intake (mL) 0 mL  Liters Processed 84  Fluid Removed (mL) 0 mL  Tolerated HD Treatment Yes  Post-Hemodialysis Comments Levo IV gtt titration required   Received patient in bed to unit.  Alert and oriented.  Informed consent signed and in chart.   TX duration:3.5  LEVO IV gtt titrated for BP support  In ICU---vented at 40% FIO2 Unresponsive--no sedation being used.  Report given to primary ICU RN.   Access used: [facility] Access issues: no complications  Total UF removed: even Medication(s) given: hectoral iv x 1   [facility]

## 2022-10-09 NOTE — Procedures (Signed)
I was present at this dialysis session. I have reviewed the session itself and made appropriate changes.   Vital signs in last 24 hours:  Temp:  [97.4 F (36.3 C)-98.7 F (37.1 C)] 98.1 F (36.7 C) (06/03 0815) Pulse Rate:  [77-93] 80 (06/03 0845) Resp:  [21-37] 31 (06/03 0845) BP: (89-158)/(41-103) 109/48 (06/03 0845) SpO2:  [89 %-100 %] 99 % (06/03 0845) FiO2 (%):  [30 %-40 %] 40 % (06/03 0845) Weight:  [54.8 kg] 54.8 kg (06/03 0815) Weight change: -1.5 kg Filed Weights   10/08/22 0447 10/09/22 0500 10/09/22 0815  Weight: 56.3 kg 54.8 kg 54.8 kg    Recent Labs  Lab 10/08/22 1644 10/09/22 0034  NA  --  142  K  --  3.9  CL  --  100  CO2  --  22  GLUCOSE  --  162*  BUN  --  80*  CREATININE  --  7.50*  CALCIUM  --  7.6*  PHOS 5.4*  --     Recent Labs  Lab 10/02/22 1616 10/02/22 1740 10/05/22 0315 10/06/22 0229 10/06/22 1735 10/07/22 0122 10/08/22 0553 10/08/22 1817 10/09/22 0034  WBC 14.2*   < > 8.7   < > 12.1*   < > 14.6* 14.3* 10.4  NEUTROABS 12.2*  --  8.4*  --  11.5*  --   --   --   --   HGB 12.5   < > 12.5   < > 11.4*   < > 11.4* 11.0* 10.3*  HCT 38.6   < > 38.8   < > 36.3   < > 35.0* 34.0* 33.1*  MCV 87.7   < > 88.2   < > 91.2   < > 88.4 89.5 91.4  PLT 147*   < > 123*   < > 94*   < > 69* 60* 57*   < > = values in this interval not displayed.    Scheduled Meds:  Chlorhexidine Gluconate Cloth  6 each Topical Q0600   Chlorhexidine Gluconate Cloth  6 each Topical Q0600   docusate  100 mg Per Tube BID   doxercalciferol  4 mcg Intravenous Q M,W,F-HD   famotidine  10 mg Per Tube Daily   famotidine  20 mg Per Tube BID   feeding supplement (PROSource TF20)  60 mL Per Tube Daily   heparin sodium (porcine)       insulin aspart  0-15 Units Subcutaneous Q4H   ipratropium-albuterol  3 mL Nebulization Q6H   levothyroxine  125 mcg Per Tube Q0600   lidocaine  1 patch Transdermal Q24H   multivitamin  1 tablet Per Tube QHS   mouth rinse  15 mL Mouth Rinse Q2H    pantoprazole (PROTONIX) IV  40 mg Intravenous Daily   polyethylene glycol  17 g Per Tube Daily   thiamine  100 mg Per Tube Daily   Continuous Infusions:  sodium chloride Stopped (10/07/22 1027)   sodium chloride 75 mL/hr at 10/09/22 0757   sodium chloride     feeding supplement (VITAL 1.5 CAL) Stopped (10/08/22 1610)   norepinephrine (LEVOPHED) Adult infusion Stopped (10/09/22 0244)   piperacillin-tazobactam (ZOSYN)  IV Stopped (10/09/22 0018)   PRN Meds:.Place/Maintain arterial line **AND** sodium chloride, docusate sodium, fentaNYL (SUBLIMAZE) injection, fentaNYL (SUBLIMAZE) injection, heparin sodium (porcine), mineral oil-hydrophilic petrolatum, mouth rinse   Irena Cords,  MD 10/09/2022, 9:00 AM

## 2022-10-09 NOTE — Evaluation (Addendum)
Occupational Therapy Evaluation Patient Details Name: Morgan Clay MRN: [id number] DOB: [birth year] Today's Date: 10/09/2022   History of Present Illness 77 yo female presents to [facility] on 5/27 with SOB and hypoxia s/p HD. viral panel + for rhinovirus/Enterovirus. Course complicated by PEA arrest 5/31,ROSC achieved after 5 min of CPR 1 round of Epi. pt was intubated and transferred to ICU.  Most likely due to acute hypoxic hypercarb respiratory failure.  Sm Rt sided pneumothorax.  PMH: Liver cirrhosis, ESRD on HD MWF, Afib, hx of CVA, anemia, T2DM, HTN, ICD.   Clinical Impression   Morgan Clay was evaluated s/p the above admission list. Per family, pt is indep with ADLs and needs assist with IADLs at baseline, she lives in a basement apartment of her dtr's home. Upon evaluation she was limited by lethargy, low LOA, weakness, sternal/rib pain, LUE weakness?, intubation and decreased activity tolerance. Overall she needed max-total A +2 for bed mobility and tolerated sitting with min-max A for ~5 minutes. Pt attempted to fix LOBs and attempted to push up from BUEs after propping on elbows. Due to the deficits listed below she currently is dependent for self care. Pt will benefit from continued acute OT services and intensive inpatient follow up therapy, >3 hours/day after discharge.   VSS on 40% FiO2 with PEEP 5       Recommendations for follow up therapy are one component of a multi-disciplinary discharge planning process, led by the attending physician.  Recommendations may be updated based on patient status, additional functional criteria and insurance authorization.   Assistance Recommended at Discharge Frequent or constant Supervision/Assistance  Patient can return home with the following A lot of help with walking and/or transfers;Two people to help with walking and/or transfers;A lot of help with bathing/dressing/bathroom;Two people to help with bathing/dressing/bathroom    Functional Status  Assessment  Patient has had a recent decline in their functional status and demonstrates the ability to make significant improvements in function in a reasonable and predictable amount of time.  Equipment Recommendations  Other (comment) (defer)    Recommendations for Other Services Rehab consult     Precautions / Restrictions Precautions Precautions: Fall Precaution Comments: ETT, tube feeding (paused during session), HD cath Restrictions Weight Bearing Restrictions: No      Mobility Bed Mobility Overal bed mobility: Needs Assistance   Rolling: Max assist   Supine to sit: Total assist, +2 for physical assistance, HOB elevated Sit to supine: Total assist, +2 for physical assistance, HOB elevated   General bed mobility comments: total assist +2 for all aspects supine<>sit, pt initiating with UEs during rolling. Rolling x3 for pericare, pt with loose stools x2.    Transfers                   General transfer comment: unable to attempt this date      Balance Overall balance assessment: Needs assistance Sitting-balance support: No upper extremity supported, Feet supported Sitting balance-Leahy Scale: Poor Sitting balance - Comments: preference for L lateral and posterior lean. EOB sitting x8 minutes, fatigues and reports feeling dizzy but BP stable (100s/60s) Postural control: Posterior lean, Left lateral lean                                 ADL either performed or assessed with clinical judgement   ADL Overall ADL's : Needs assistance/impaired Eating/Feeding: NPO   Grooming: Maximal assistance;Sitting   Upper Body Bathing: Total  assistance   Lower Body Bathing: Total assistance   Upper Body Dressing : Total assistance   Lower Body Dressing: Total assistance   Toilet Transfer: Total assistance   Toileting- Clothing Manipulation and Hygiene: Total assistance   Tub/ Shower Transfer: Total assistance   Functional mobility during ADLs: Total  assistance;+2 for safety/equipment;+2 for physical assistance General ADL Comments: limited by lethargy, LOA and weakness     Vision Baseline Vision/History: 1 Wears glasses Vision Assessment?: Vision impaired- to be further tested in functional context Additional Comments: needs further assessment with increased arousal     Perception Perception Perception Tested?: No   Praxis Praxis Praxis tested?: Not tested    Pertinent Vitals/Pain Pain Assessment Pain Assessment: Faces Faces Pain Scale: Hurts a little bit Pain Location: LEs R>L during bed mobility Pain Descriptors / Indicators: Sore, Discomfort Pain Intervention(s): Limited activity within patient's tolerance     Hand Dominance Right   Extremity/Trunk Assessment Upper Extremity Assessment Upper Extremity Assessment: Generalized weakness (seemingly weaker on the L - difficult to fully assess)   Lower Extremity Assessment Lower Extremity Assessment: Defer to PT evaluation   Cervical / Trunk Assessment Cervical / Trunk Assessment: Kyphotic   Communication Communication Communication: Expressive difficulties   Cognition Arousal/Alertness: Lethargic Behavior During Therapy: Flat affect Overall Cognitive Status: Impaired/Different from baseline Area of Impairment: Orientation, Attention, Memory, Following commands, Safety/judgement, Awareness, Problem solving                 Orientation Level: Disoriented to, Time, Situation Current Attention Level: Focused   Following Commands: Follows one step commands with increased time Safety/Judgement: Decreased awareness of safety, Decreased awareness of deficits   Problem Solving: Difficulty sequencing, Requires verbal cues, Requires tactile cues General Comments: pt nods "yes" to ID of her name, location, does not know month or year or why she is here. Pt with quick yes/no response time if awake, periods of dozing off during session but wakes easily. Cognition  difficult to assess given ETT     General Comments  40%FiO2/PEEP 5, VSS including BP, SBP must remain >90 - multiple liquid BMs    Exercises     Shoulder Instructions      Home Living Family/patient expects to be discharged to:: Private residence Living Arrangements: Children Available Help at Discharge: Family;Available PRN/intermittently;Personal care attendant Type of Home: House Home Access: Level entry     Home Layout: Two level;Able to live on main level with bedroom/bathroom     Bathroom Shower/Tub: Producer, television/film/video: Handicapped height Bathroom Accessibility: Yes   Home Equipment: Agricultural consultant (2 wheels);Rollator (4 wheels)   Additional Comments: aide 8hrs/wkn for IADLs, pt completes ADLs. Lives with dtr. Pt lives in apt in first floor of home      Prior Functioning/Environment Prior Level of Function : Independent/Modified Independent               ADLs Comments: adie for IADLs, mod I for ADLs        OT Problem List: Decreased strength;Decreased range of motion;Decreased activity tolerance;Impaired balance (sitting and/or standing);Decreased cognition;Decreased safety awareness;Decreased knowledge of use of DME or AE;Decreased knowledge of precautions;Cardiopulmonary status limiting activity      OT Treatment/Interventions: Self-care/ADL training;Therapeutic exercise;Neuromuscular education;DME and/or AE instruction;Therapeutic activities;Patient/family education;Balance training    OT Goals(Current goals can be found in the care plan section) Acute Rehab OT Goals Patient Stated Goal: per family to get better OT Goal Formulation: With patient Time For Goal Achievement: 10/23/22 Potential to  Achieve Goals: Good ADL Goals Pt Will Perform Grooming: with min assist;sitting Pt Will Perform Upper Body Dressing: with mod assist;sitting Pt Will Transfer to Toilet: with max assist;stand pivot transfer;bedside commode Additional ADL Goal #1:  Pt will complete bed  mobility with mod A as a precursor to ADLs  OT Frequency: Min 2X/week    Co-evaluation PT/OT/SLP Co-Evaluation/Treatment: Yes Reason for Co-Treatment: Complexity of the patient's impairments (multi-system involvement);For patient/therapist safety;To address functional/ADL transfers   OT goals addressed during session: ADL's and self-care      AM-PAC OT "6 Clicks" Daily Activity     Outcome Measure Help from another person eating meals?: Total Help from another person taking care of personal grooming?: A Lot Help from another person toileting, which includes using toliet, bedpan, or urinal?: Total Help from another person bathing (including washing, rinsing, drying)?: Total Help from another person to put on and taking off regular upper body clothing?: Total Help from another person to put on and taking off regular lower body clothing?: Total 6 Click Score: 7   End of Session Nurse Communication: Mobility status  Activity Tolerance: Patient limited by lethargy Patient left: in bed;with call bell/phone within reach;with bed alarm set;with family/visitor present  OT Visit Diagnosis: Unsteadiness on feet (R26.81);Other abnormalities of gait and mobility (R26.89);Muscle weakness (generalized) (M62.81);Pain                Time: 1500-1530 OT Time Calculation (min): 30 min Charges:  OT General Charges $OT Visit: 1 Visit OT Evaluation $OT Eval Moderate Complexity: 1 Mod  Derenda Mis, OTR/L Acute Rehabilitation Services Office [phone] Secure Chat Communication Preferred   Donia Pounds 10/09/2022, 5:47 PM

## 2022-10-09 NOTE — Progress Notes (Signed)
Pt receives out-pt HD at [facility] on MWF. Will assist as needed.   Linda Grimmer Renal Navigator [phone] 

## 2022-10-09 NOTE — Progress Notes (Addendum)
ANTICOAGULATION CONSULT NOTE - Initial Consult  Pharmacy Consult for IV Heparin Indication: chest pain/ACS  No Known Allergies  Patient Measurements: Weight: 54.8 kg (120 lb 13 oz) Heparin Dosing Weight: 54.8 kg  Vital Signs: Temp: 98.1 F (36.7 C) (06/03 0815) Temp Source: Oral (06/03 0735) BP: 78/53 (06/03 1015) Pulse Rate: 65 (06/03 1015)  Labs: Recent Labs    10/06/22 1735 10/06/22 2017 10/07/22 0710 10/08/22 0553 10/08/22 1644 10/08/22 1817 10/08/22 2006 10/09/22 0034  HGB 11.4*   < > 13.0 11.4*  --  11.0*  --  10.3*  HCT 36.3   < > 40.7 35.0*  --  34.0*  --  33.1*  PLT 94*   < > 92* 69*  --  60*  --  57*  HEPRLOWMOCWT 0.32  --   --   --   --   --   --   --   CREATININE 3.69*  --  4.65* 6.52*  --   --   --  7.50*  TROPONINIHS 4,739*   < > 12,808*  --  >24,000*  --  >24,000*  --    < > = values in this interval not displayed.    Estimated Creatinine Clearance: 5 mL/min (A) (by C-G formula based on SCr of 7.5 mg/dL (H)).   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) 09/24/2020   Chronic obstructive pulmonary disease (HCC)    Chronic systolic congestive heart failure (HCC)    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 (HCV) (HCC)    HEPATITIS C - s/p treatment with Harvoni, saw hepatology, Dawn Drazek   Diabetes [facility])    Diverticulitis of left colon status post robotic low anterior to sigmoid resection 05/22/2018 07/31/2017    Duodenal arteriovenous malformation    End stage renal disease (HCC) 03/22/2021   Esophageal varices (HCC)    Glaucoma    Gout    History of blood transfusion ~ 11/2017   Hypertension associated with diabetes (HCC) 06/07/2017   Hypothyroidism    Internal hemorrhoids    LBBB (left bundle branch block)    Malnutrition of moderate degree 07/09/2020   MDD (major depressive disorder), recurrent, in partial remission (HCC) 06/07/2017   OBSTRUCTIVE SLEEP APNEA 11/14/2007   no CPAP   OSTEOPENIA 09/30/2008   Persistent atrial fibrillation (HCC) 02/06/2020   Polyneuropathy associated with underlying disease (HCC) 06/09/2021   Pulmonary hypertension (HCC) on echocardiogram 01/14/2018   Secondary esophageal varices without bleeding (HCC) 12/23/2020   Formatting of this note might be different from the original. Patient had trace esophageal varices during small bowel enteroscopy in October 2021, follow-up EGD in January 2022 with no varices.  She previously had GI bleeding thought to be from AVM versus GAVE.  She is no longer on carvedilol and will need surveillance endoscopy.  Patient was previously followed at [facility] and I have asked her    Secondary hypercoagulable state (HCC) 02/06/2020   Type 2  diabetes, controlled, with renal manifestation (HCC) 11/24/2013    Medications:  Medications Prior to Admission  Medication Sig Dispense Refill Last Dose   albuterol (VENTOLIN HFA) 108 (90 Base) MCG/ACT inhaler Inhale 2 puffs into the lungs every 6 (six) hours as needed for wheezing or shortness of breath. 8 g 6 Past Week   amiodarone (PACERONE) 200 MG tablet Take 1 tablet by mouth once daily 90 tablet 0 Past Week   ammonium lactate (LAC-HYDRIN) 12 % lotion Apply daily on forearms. 400 g 0 Past Week   amoxicillin-clavulanate (AUGMENTIN) 875-125 MG tablet Take 1 tablet by mouth 2 (two) times daily. 20 tablet 0 10/02/2022   apixaban (ELIQUIS) 2.5 MG TABS tablet Take 1 tablet (2.5 mg total) by mouth 2  (two) times daily. 60 tablet 11 Past Week at 18:00   [EXPIRED] HYDROcodone bit-homatropine (HYCODAN) 5-1.5 MG/5ML syrup Take 5 mLs by mouth every 8 (eight) hours as needed for up to 5 days for cough. 75 mL 0 10/02/2022   levothyroxine (SYNTHROID) 125 MCG tablet Take 1 tablet (125 mcg total) by mouth daily before breakfast. 30 tablet 11 10/02/2022   lidocaine-prilocaine (EMLA) cream Apply 1 Application topically as needed.   10/02/2022   methylPREDNISolone (MEDROL) 4 MG tablet Standard 6 day taper 21 tablet 0 10/02/2022   Vitamin D, Ergocalciferol, (DRISDOL) 1.25 MG (50000 UNIT) CAPS capsule Take 1 capsule (50,000 Units total) by mouth every 7 (seven) days. 12 capsule 0 Past Week   ipratropium (ATROVENT HFA) 17 MCG/ACT inhaler Inhale 2 puffs into the lungs every 6 (six) hours as needed for wheezing. (Patient not taking: Reported on 10/03/2022) 12.9 g 1 Not Taking   nitroGLYCERIN (NITROSTAT) 0.4 MG SL tablet Place 1 tablet (0.4 mg total) under the tongue every 5 (five) minutes as needed for chest pain. 25 tablet 0 unknown   triamcinolone cream (KENALOG) 0.1 % Apply to shins twice dialy for 7 days. 30 g 0 unknown    Assessment: 75 years age female s/p cardiac arrest with elevated trop >24K. Platelets noted to be 57 (trend down). Noted dark stools overnight. Discussed with Dr. Celine Mans - starting IV heparin for ACS without bolus due to bleed risk.   Goal of Therapy:  Heparin level 0.3-0.7 units/ml Monitor platelets by anticoagulation protocol: Yes   Plan:  Start heparin infusion at 550 units/hr Stat aPTT and heparin level as baseline for current time point (Eliquis).  Check CBC, aPTT  and heparin level in 8 hours.  Daily aPTT and heparin level until correlating.  Monitor CBC closely and for overt bleeding.   Addendum: Due to elevated Tbili and risk of falsely elevated heparin level, will follow with aPTTs only.   Link Snuffer, PharmD, BCPS, BCCCP Clinical Pharmacist Please refer to [facility] for [facility]  Pharmacy numbers 10/09/2022,10:21 AM

## 2022-10-09 NOTE — Consult Note (Addendum)
Cardiology Consultation   Patient ID: Morgan Clay MRN: [id number]; DOB: August 03, 1945  Admit date: 10/02/2022 Date of Consult: 10/09/2022  PCP:  Morgan Clay   [facility] Providers Cardiologist:  Morgan Ancona, MD  Electrophysiologist:  Morgan Manges, MD       Patient Profile:   Morgan Clay is a 77 y.o. female with a hx of NICM with recovery of EF with BiV pacing, recurrent GI bleeding, PAFL, ESRD on HD MWF, DM-diet controlled, hypothyroidism, OSA uses CPAP, cirrhosis secondary to Hep C s/p Harvboni treatment.  who was admitted 10/02/22 after presenting with acute SOB after dialysis,  with lactic acid of 7.7 is being seen 10/09/2022 for the evaluation of NSTEMI at the request of De. Morgan Clay.  History of Present Illness:   Morgan Clay with above hx and hx presumed NICM for several years with echo in 2015 with EF 25-30% and Cardiolite at that time no ischemia.   She had MDT CRT-D system placed and EF recovered.  In 4/19 EF 60-65%.   She has hx of GI bleeding on eliquis for atrial flutter but eliquis stopped.  Hx of gastric ulcer but also AVMs on capsule endoscopy out ot reach of endoscope.  Has not had further GI bleeding off eliquis.  Then with recurrent a fib placed back on eliquis she has had recurrent GI bleeds.  She was evaluated by EP for watchman and turned down for comorbidities.   Echo in 9.20 with EF 45-50% mod AI.  She had LHC/RHC in 8/21 with nonobstructive CAD, normal filling pressures, and preserved cardiac output. Echo 8/21 showed EF 50-55%, mild LVH, mildly decreased RV systolic function, moderate aortic insufficiency.     Echo in 8/23 showed EF 50-55%, moderate RV dilation, moderate AI. Admitted 10/23 with GIB. Seen by GI and underwent EGD with nonbleeding AVMs, s/p APC. Admitted 11/23 with COPD exacerbation.   Ongoing GI bleeding, had small bowel enteroscopy in 2/24 with 2 duodenal AVMs treated  Last seen in office by Morgan Clay 07/11/22 and was stable  and eliquis at 2.5 mg BID though SOB walking 200 feet.    07/21/22 admitted for ICD-CRT gen change for ERI, (prior gen new 2008 changes in 2012 and 2017) also hx atrial tach and not thought a candidate for ablation. On amiodarone follow up stable wound check and normal device function.  No mode switches or ventricular arrhythmias noted.   Pt presented 10/02/22 with SOB post dialysis and sp02 of 89%.. She had been seen on the 5/23 for SOB and cough and abd pain + CHf,  had been on ABX and steroids. Discharged to follow up with HD the next AM.  Presented back to ER on the with similar symptoms on 25th and day after HD. Troponin baseline and BNP baseline.  CXR no acute findings.  +rhinovirus infection.  Then   admitted with acute hypoxic respiratory failure, pulmonary HTN, possible asthma exacerbation vs bronchitis due to viral infection.  Troponin was elevated    142>>153>>217>>227>>  then 10/06/22   4,739>>6,527>>12,808 on 10/07/22 Yesterday  hs troponin >24,000 X 2   EKG:  The EKG was personally reviewed and demonstrates:  10/02/22 SR with BiV pacing on 10/06/22 pacing with possible a flutter follow up EKGs with more pronounced flutter beats.  Telemetry:  Telemetry was personally reviewed and demonstrates:  AV pacing  She has been seen by surgery for abdominal pain. K+ 6.7 AST 1500 ALT 962 T bilirubin 2.7  INR 2.5  hs troponin  pk at that time 227.   She had improved and was transferred from ICU to tele.     On 5/31 she was unresponsive and code blue called. PEA arrest. ROSC achieved after 5 min.of CPR 1 round of Epi. pt was intubated and transferred to ICU.  Most likely due to acute hypoxic hypercarb respiratory failure.  Sm Rt sided pneumothorax. Was also hypoglycemic. She had confusion post arrest . Amiodarone held with increase LFTs. And AC held.  Hs troponin elevated to 12000. + rib fx aspiration PNA   Pt was extubated on 6/2 but needed re-intubation on 6/2   Now on vent and was on norepinephrine but  just stopped  BP 100/42 to 131/55 R 25 on Vent on 40% FI02  Labs today Na 142 K+ 3.9 glucose 162 BUN 80 Cr 7.50 albumin 2.6 AST 220 ALT 330 improved  WBC 10.4 Hgb 10.3 plts 57   PCXR today COMPARISON:  10/08/2018 for  FINDINGS: ETT tip is stable above the carina. There is a right chest wall dialysis catheter with tips at the superior cavoatrial junction. Enteric tube tip is below the GE junction. Left chest wall ICD noted with leads in the right atrial appendage, coronary sinus and right ventricle. Stable cardiomediastinal contours. Bilateral interstitial and airspace opacities are unchanged in the interval.  IMPRESSION: 1. Stable support apparatus. 2. No change in aeration to the lungs compared with previous exam.    Past Medical History:  Diagnosis Date   Anemia    Angiectasia    Angiodysplasia of small intestine 06/10/2020   Ileum - seen on capsule endoscopy 05/2020 - ablated at colonoscopy   Aortic atherosclerosis (HCC) 08/30/2017   Arthritis    "qwhere" (01/03/2018)   Asthma    reports mild asthma since childhood - had COPD on dx list from prior PCP   Biventricular ICD (implantable cardioverter-defibrillator) in place 01/08/2007   Qualifier: Diagnosis of  By: Daphine Deutscher FNP, [place]     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) 09/24/2020   Chronic obstructive pulmonary disease (HCC)    Chronic systolic congestive heart failure (HCC)    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 (HCV) (HCC)    HEPATITIS C - s/p treatment with Harvoni, saw hepatology, Dawn Drazek   Diabetes [facility])    Diverticulitis of left colon status post robotic low anterior to sigmoid resection 05/22/2018 07/31/2017   Duodenal arteriovenous malformation    End stage renal disease (HCC) 03/22/2021   Esophageal varices (HCC)    Glaucoma    Gout    History of blood transfusion ~ 11/2017   Hypertension associated with diabetes (HCC) 06/07/2017   Hypothyroidism     Internal hemorrhoids    LBBB (left bundle branch block)    Malnutrition of moderate degree 07/09/2020   MDD (major depressive disorder), recurrent, in partial remission (HCC) 06/07/2017   OBSTRUCTIVE SLEEP APNEA 11/14/2007   no CPAP   OSTEOPENIA 09/30/2008   Persistent atrial fibrillation (HCC) 02/06/2020   Polyneuropathy associated with underlying disease (HCC) 06/09/2021   Pulmonary hypertension (HCC) on echocardiogram 01/14/2018   Secondary esophageal varices without bleeding (HCC) 12/23/2020   Formatting of this note might be different from the original. Patient had trace esophageal varices during small bowel enteroscopy in October 2021, follow-up EGD in January 2022 with no varices.  She previously had GI bleeding thought to be from AVM versus GAVE.  She is no longer on carvedilol and will need surveillance endoscopy.  Patient was previously followed  at [facility] GI and I have asked her    Secondary hypercoagulable state (HCC) 02/06/2020   Type 2 diabetes, controlled, with renal manifestation (HCC) 11/24/2013    Past Surgical History:  Procedure Laterality Date   A/V FISTULAGRAM N/A 03/28/2021   Procedure: A/V FISTULAGRAM;  Surgeon: Maeola Harman, MD;  Location: [facility] INVASIVE CV LAB;  Service: Cardiovascular;  Laterality: N/A;   APPENDECTOMY     AV FISTULA PLACEMENT Left 08/05/2020   Procedure: LEFT UPPER EXTREMITY ARTERIOVENOUS (AV) FISTULA CREATION;  Surgeon: Leonie Douglas, MD;  Location: [facility] OR;  Service: Vascular;  Laterality: Left;   BALLOON ENTEROSCOPY N/A 06/21/2022   Procedure: BALLOON ENTEROSCOPY;  Surgeon: Shellia Cleverly, DO;  Location: [facility] ENDOSCOPY;  Service: Gastroenterology;  Laterality: N/A;   BASCILIC VEIN TRANSPOSITION Left 09/16/2020   Procedure: LEFT SECOND STAGE BASCILIC VEIN TRANSPOSITION;  Surgeon: Leonie Douglas, MD;  Location: [facility] OR;  Service: Vascular;  Laterality: Left;  PERIPHERAL NERVE BLOCK   CARDIAC CATHETERIZATION     CARDIOVERSION N/A  12/14/2014   Procedure: CARDIOVERSION;  Surgeon: Lars Masson, MD;  Location: [facility] ENDOSCOPY;  Service: Cardiovascular;  Laterality: N/A;   CARDIOVERSION N/A 02/26/2017   Procedure: CARDIOVERSION;  Surgeon: Marinus Maw, MD;  Location: [facility] INVASIVE CV LAB;  Service: Cardiovascular;  Laterality: N/A;   CARDIOVERSION N/A 07/24/2017   Procedure: CARDIOVERSION;  Surgeon: Elease Hashimoto Deloris Ping, MD;  Location: [facility] ENDOSCOPY;  Service: Cardiovascular;  Laterality: N/A;   CARDIOVERSION N/A 02/12/2020   Procedure: CARDIOVERSION;  Surgeon: Laurey Morale, MD;  Location: [facility] ENDOSCOPY;  Service: Cardiovascular;  Laterality: N/A;   CARDIOVERSION N/A 09/15/2021   Procedure: CARDIOVERSION;  Surgeon: Laurey Morale, MD;  Location: [facility] ENDOSCOPY;  Service: Cardiovascular;  Laterality: N/A;   CARPAL TUNNEL RELEASE  07/21/2021   COLONOSCOPY N/A 11/08/2017   Procedure: COLONOSCOPY;  Surgeon: Rachael Fee, MD;  Location: [facility] ENDOSCOPY;  Service: Endoscopy;  Laterality: N/A;   COLONOSCOPY WITH PROPOFOL N/A 05/31/2020   Procedure: COLONOSCOPY WITH PROPOFOL;  Surgeon: Hilarie Fredrickson, MD;  Location: [facility] ENDOSCOPY;  Service: Endoscopy;  Laterality: N/A;   COLONOSCOPY WITH PROPOFOL N/A 10/26/2021   Procedure: COLONOSCOPY WITH PROPOFOL;  Surgeon: Imogene Burn, MD;  Location: [facility] ENDOSCOPY;  Service: Gastroenterology;  Laterality: N/A;   DILATION AND CURETTAGE OF UTERUS     ENTEROSCOPY N/A 03/02/2020   Procedure: ENTEROSCOPY;  Surgeon: Benancio Deeds, MD;  Location: [facility] ENDOSCOPY;  Service: Gastroenterology;  Laterality: N/A;   ENTEROSCOPY N/A 06/08/2022   Procedure: ENTEROSCOPY;  Surgeon: Beverley Fiedler, MD;  Location: [facility] ENDOSCOPY;  Service: Gastroenterology;  Laterality: N/A;   EP IMPLANTABLE DEVICE N/A 10/06/2015   Procedure: BIV ICD Generator Changeout;  Surgeon: Duke Salvia, MD;  Location: [facility] INVASIVE CV LAB;  Service: Cardiovascular;  Laterality: N/A;   ESOPHAGOGASTRODUODENOSCOPY N/A 10/26/2017    Procedure: ESOPHAGOGASTRODUODENOSCOPY (EGD);  Surgeon: Meryl Dare, MD;  Location: [facility] ENDOSCOPY;  Service: Endoscopy;  Laterality: N/A;   ESOPHAGOGASTRODUODENOSCOPY (EGD) WITH PROPOFOL N/A 11/07/2017   Procedure: ESOPHAGOGASTRODUODENOSCOPY (EGD) WITH PROPOFOL;  Surgeon: Rachael Fee, MD;  Location: [facility] ENDOSCOPY;  Service: Endoscopy;  Laterality: N/A;   ESOPHAGOGASTRODUODENOSCOPY (EGD) WITH PROPOFOL N/A 05/29/2020   Procedure: ESOPHAGOGASTRODUODENOSCOPY (EGD) WITH PROPOFOL;  Surgeon: Sherrilyn Rist, MD;  Location: [facility] ENDOSCOPY;  Service: Gastroenterology;  Laterality: N/A;   ESOPHAGOGASTRODUODENOSCOPY (EGD) WITH PROPOFOL N/A 10/26/2021   Procedure: ESOPHAGOGASTRODUODENOSCOPY (EGD) WITH PROPOFOL;  Surgeon: Imogene Burn, MD;  Location: [facility] ENDOSCOPY;  Service: Gastroenterology;  Laterality: N/A;   ESOPHAGOGASTRODUODENOSCOPY (EGD) WITH PROPOFOL N/A 03/08/2022   Procedure: ESOPHAGOGASTRODUODENOSCOPY (EGD) WITH PROPOFOL;  Surgeon: Lynann Bologna, MD;  Location: [facility] ENDOSCOPY;  Service: Gastroenterology;  Laterality: N/A;   GIVENS CAPSULE STUDY N/A 05/19/2020   Procedure: GIVENS CAPSULE STUDY;  Surgeon: Iva Boop, MD;  Location: [facility] ENDOSCOPY;  Service: Endoscopy;  Laterality: N/A;  .adm for obs since pacemaker, PA wil enter order and see pt   GIVENS CAPSULE STUDY N/A 05/29/2020   Procedure: GIVENS CAPSULE STUDY;  Surgeon: Sherrilyn Rist, MD;  Location: [facility] ENDOSCOPY;  Service: Gastroenterology;  Laterality: N/A;   GIVENS CAPSULE STUDY N/A 06/08/2022   Procedure: GIVENS CAPSULE STUDY;  Surgeon: Beverley Fiedler, MD;  Location: [facility] ENDOSCOPY;  Service: Gastroenterology;  Laterality: N/A;   HEMATOMA EVACUATION Left 09/24/2020   Procedure: EVACUATION HEMATOMA ARM;  Surgeon: Larina Earthly, MD;  Location: [facility] OR;  Service: Vascular;  Laterality: Left;   HEMOSTASIS CLIP PLACEMENT  06/21/2022   Procedure: HEMOSTASIS CLIP PLACEMENT;  Surgeon: Shellia Cleverly, DO;  Location: [facility] ENDOSCOPY;  Service:  Gastroenterology;;   HOT HEMOSTASIS N/A 10/26/2017   Procedure: HOT HEMOSTASIS (ARGON PLASMA COAGULATION/BICAP);  Surgeon: Meryl Dare, MD;  Location: [facility] ENDOSCOPY;  Service: Endoscopy;  Laterality: N/A;   HOT HEMOSTASIS N/A 03/02/2020   Procedure: HOT HEMOSTASIS (ARGON PLASMA COAGULATION/BICAP);  Surgeon: Benancio Deeds, MD;  Location: [facility] ENDOSCOPY;  Service: Gastroenterology;  Laterality: N/A;   HOT HEMOSTASIS N/A 05/31/2020   Procedure: HOT HEMOSTASIS (ARGON PLASMA COAGULATION/BICAP);  Surgeon: Hilarie Fredrickson, MD;  Location: [facility] ENDOSCOPY;  Service: Endoscopy;  Laterality: N/A;   HOT HEMOSTASIS N/A 03/08/2022   Procedure: HOT HEMOSTASIS (ARGON PLASMA COAGULATION/BICAP);  Surgeon: Lynann Bologna, MD;  Location: [facility] ENDOSCOPY;  Service: Gastroenterology;  Laterality: N/A;   HOT HEMOSTASIS N/A 06/08/2022   Procedure: HOT HEMOSTASIS (ARGON PLASMA COAGULATION/BICAP);  Surgeon: Beverley Fiedler, MD;  Location: [facility] ENDOSCOPY;  Service: Gastroenterology;  Laterality: N/A;   HOT HEMOSTASIS N/A 06/21/2022   Procedure: HOT HEMOSTASIS (ARGON PLASMA COAGULATION/BICAP);  Surgeon: Shellia Cleverly, DO;  Location[facility] ENDOSCOPY;  Service: Gastroenterology;  Laterality: N/A;   ICD GENERATOR CHANGEOUT N/A 07/21/2022   Procedure: ICD GENERATOR CHANGEOUT;  Surgeon: Duke Salvia, MD;  Location: [facility] INVASIVE CV LAB;  Service: Cardiovascular;  Laterality: N/A;   INSERT / REPLACE / REMOVE PACEMAKER  ?2008   IR PERC TUN PERIT CATH WO PORT S&I /IMAG  07/05/2020   PERIPHERAL VASCULAR INTERVENTION Left 03/28/2021   Procedure: PERIPHERAL VASCULAR INTERVENTION;  Surgeon: Maeola Harman, MD;  Location: [facility] INVASIVE CV LAB;  Service: Cardiovascular;  Laterality: Left;   POLYPECTOMY  11/08/2017   Procedure: POLYPECTOMY;  Surgeon: Rachael Fee, MD;  Location: [facility] ENDOSCOPY;  Service: Endoscopy;;   PROCTOSCOPY N/A 05/22/2018   Procedure: RIGID PROCTOSCOPY;  Surgeon: Karie Soda, MD;  Location: [facility] ORS;   Service: General;  Laterality: N/A;   RIGHT HEART CATH N/A 02/13/2018   Procedure: RIGHT HEART CATH;  Surgeon: Laurey Morale, MD;  Location: [facility] INVASIVE CV LAB;  Service: Cardiovascular;  Laterality: N/A;   RIGHT HEART CATH N/A 06/23/2020   Procedure: RIGHT HEART CATH;  Surgeon: Laurey Morale, MD;  Location: [facility] INVASIVE CV LAB;  Service: Cardiovascular;  Laterality: N/A;   RIGHT/LEFT HEART CATH AND CORONARY ANGIOGRAPHY N/A 12/11/2019   Procedure: RIGHT/LEFT HEART CATH AND CORONARY ANGIOGRAPHY;  Surgeon: Laurey Morale, MD;  Location: [facility] INVASIVE CV LAB;  Service: Cardiovascular;  Laterality: N/A;  SUBMUCOSAL TATTOO INJECTION  03/02/2020   Procedure: SUBMUCOSAL TATTOO INJECTION;  Surgeon: Benancio Deeds, MD;  Location: [facility] ENDOSCOPY;  Service: Gastroenterology;;   SUBMUCOSAL TATTOO INJECTION  05/31/2020   Procedure: SUBMUCOSAL TATTOO INJECTION;  Surgeon: Hilarie Fredrickson, MD;  Location: [facility] ENDOSCOPY;  Service: Endoscopy;;   SUBMUCOSAL TATTOO INJECTION  06/21/2022   Procedure: SUBMUCOSAL TATTOO INJECTION;  Surgeon: Shellia Cleverly, DO;  Location: [facility] ENDOSCOPY;  Service: Gastroenterology;;   TEE WITHOUT CARDIOVERSION N/A 08/02/2021   Procedure: TRANSESOPHAGEAL ECHOCARDIOGRAM (TEE);  Surgeon: Laurey Morale, MD;  Location: [facility] ENDOSCOPY;  Service: Cardiovascular;  Laterality: N/A;   TUBAL LIGATION     XI ROBOTIC ASSISTED LOWER ANTERIOR RESECTION N/A 05/22/2018   Procedure: XI ROBOTIC ASSISTED SIGMOID COLOECTOMY MOBILIZATION OF SPLENIC FLEXURE, FIREFLY ASSESSMENT OF PERFUSION;  Surgeon: Karie Soda, MD;  Location: [facility] ORS;  Service: General;  Laterality: N/A;  ERAS PATHWAY     Home Medications:  Prior to Admission medications   Medication Sig Start Date End Date Taking? Authorizing Provider  albuterol (VENTOLIN HFA) 108 (90 Base) MCG/ACT inhaler Inhale 2 puffs into the lungs every 6 (six) hours as needed for wheezing or shortness of breath. 04/07/22  Yes Hunsucker, Lesia Sago, MD   amiodarone (PACERONE) 200 MG tablet Take 1 tablet by mouth once daily 08/30/22  Yes Saguier, Ramon Dredge, PA-C  ammonium lactate (LAC-HYDRIN) 12 % lotion Apply daily on forearms. 09/05/22  Yes Sharlene Dory, DO  amoxicillin-clavulanate (AUGMENTIN) 875-125 MG tablet Take 1 tablet by mouth 2 (two) times daily. 09/28/22  Yes Clayborne [name], NP  apixaban (ELIQUIS) 2.5 MG TABS tablet Take 1 tablet (2.5 mg total) by mouth 2 (two) times daily. 03/11/22  Yes Pokhrel, Rebekah Chesterfield, MD  levothyroxine (SYNTHROID) 125 MCG tablet Take 1 tablet (125 mcg total) by mouth daily before breakfast. 01/16/22  Yes Saguier, Ramon Dredge, PA-C  lidocaine-prilocaine (EMLA) cream Apply 1 Application topically as needed.   Yes [provider]  methylPREDNISolone (MEDROL) 4 MG tablet Standard 6 day taper 09/28/22  Yes Clayborne [name], NP  Vitamin D, Ergocalciferol, (DRISDOL) 1.25 MG (50000 UNIT) CAPS capsule Take 1 capsule (50,000 Units total) by mouth every 7 (seven) days. 09/06/22  Yes Sharlene Dory, DO  ipratropium (ATROVENT HFA) 17 MCG/ACT inhaler Inhale 2 puffs into the lungs every 6 (six) hours as needed for wheezing. Patient not taking: Reported on 10/03/2022 03/24/22   Saguier, Ramon Dredge, PA-C  nitroGLYCERIN (NITROSTAT) 0.4 MG SL tablet Place 1 tablet (0.4 mg total) under the tongue every 5 (five) minutes as needed for chest pain. 10/27/21   Leroy Sea, MD  triamcinolone cream (KENALOG) 0.1 % Apply to shins twice dialy for 7 days. 09/05/22   Sharlene Dory, DO    Inpatient Medications: Scheduled Meds:  Chlorhexidine Gluconate Cloth  6 each Topical Q0600   Chlorhexidine Gluconate Cloth  6 each Topical Q0600   docusate  100 mg Per Tube BID   doxercalciferol  4 mcg Intravenous Q M,W,F-HD   feeding supplement (PROSource TF20)  60 mL Per Tube Daily   insulin aspart  0-15 Units Subcutaneous Q4H   ipratropium-albuterol  3 mL Nebulization Q6H   levothyroxine  125 mcg Per Tube Q0600   lidocaine  1 patch  Transdermal Q24H   multivitamin  1 tablet Per Tube QHS   mouth rinse  15 mL Mouth Rinse Q2H   pantoprazole (PROTONIX) IV  40 mg Intravenous Q12H   polyethylene glycol  17 g Per Tube Daily  thiamine  100 mg Per Tube Daily   Continuous Infusions:  sodium chloride Stopped (10/07/22 1027)   sodium chloride     feeding supplement (VITAL 1.5 CAL) 1,000 mL (10/09/22 1050)   heparin 550 Units/hr (10/09/22 1049)   norepinephrine (LEVOPHED) Adult infusion 2 mcg/min (10/09/22 0926)   piperacillin-tazobactam (ZOSYN)  IV 2.25 g (10/09/22 1057)   PRN Meds: Place/Maintain arterial line **AND** sodium chloride, docusate sodium, fentaNYL (SUBLIMAZE) injection, fentaNYL (SUBLIMAZE) injection, mineral oil-hydrophilic petrolatum, mouth rinse  Allergies:   No Known Allergies  Social History:   Social History   Socioeconomic History   Marital status: Divorced    Spouse name: Not on file   Number of children: 1   Years of education: 12   Highest education level: 12th grade  Occupational History   Not on file  Tobacco Use   Smoking status: Never   Smokeless tobacco: Never  Vaping Use   Vaping Use: Never used  Substance and Sexual Activity   Alcohol use: Not Currently    Comment: 01/03/2018 "couple drinks/month"   Drug use: Not Currently    Types: Marijuana    Comment: VERY INTERMITTENT    Sexual activity: Not Currently  Other Topics Concern   Not on file  Social History Narrative   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      Home Situation: lives alone      Spiritual Beliefs: Christian - Baptist      Lifestyle: no regular exercise; poor diet      She is divorced at least one adult child   Never smoker    rare alcohol to occasional at best    no substance abuse         Social Determinants of Health   Financial Resource Strain: Low Risk  (09/04/2022)   Overall Financial Resource Strain (CARDIA)    Difficulty of Paying Living Expenses: Not hard at all  Food Insecurity: No  Food Insecurity (09/04/2022)   Hunger Vital Sign    Worried About Running Out of Food in the Last Year: Never true    Ran Out of Food in the Last Year: Never true  Transportation Needs: No Transportation Needs (09/04/2022)   PRAPARE - Administrator, Civil Service (Medical): No    Lack of Transportation (Non-Medical): No  Physical Activity: Insufficiently Active (09/04/2022)   Exercise Vital Sign    Days of Exercise per Week: 1 day    Minutes of Exercise per Session: 20 min  Stress: Stress Concern Present (09/04/2022)   [employer] of Occupational Health - Occupational Stress Questionnaire    Feeling of Stress : To some extent  Social Connections: Moderately Isolated (09/04/2022)   Social Connection and Isolation Panel [NHANES]    Frequency of Communication with Friends and Family: More than three times a week    Frequency of Social Gatherings with Friends and Family: Three times a week    Attends Religious Services: More than 4 times per year    Active Member of Clubs or Organizations: No    Attends Banker Meetings: Not on file    Marital Status: Divorced  Intimate Partner Violence: Not At Risk (06/07/2022)   Humiliation, Afraid, Rape, and Kick questionnaire    Fear of Current or Ex-Partner: No    Emotionally Abused: No    Physically Abused: No    Sexually Abused: No    Family History:    Family History  Problem Relation Age of Onset  Asthma Father    Heart attack Father    Asthma Sister    Lung cancer Sister    Heart attack Mother    Stroke Brother    Colon cancer Neg Hx    Esophageal cancer Neg Hx    Pancreatic cancer Neg Hx    Stomach cancer Neg Hx    Liver disease Neg Hx      ROS:  Please see the history of present illness.  This info from chart- pt unable to speak General:no colds or fevers, no weight changes except from 09/26/22 see HPI Skin:no rashes or ulcers HEENT:no blurred vision, no congestion CV:see HPI PUL:see HPI GI:no  diarrhea constipation + melena now, no indigestion hx of GI bleed GU:no hematuria, no dysuria MS:no joint pain, no claudication Neuro:no syncope, no lightheadedness Endo:+ diabetes, + thyroid disease  All other ROS reviewed and negative.     Physical Exam/Data:   Vitals:   10/09/22 1130 10/09/22 1145 10/09/22 1200 10/09/22 1210  BP: (!) 102/44 (!) 98/48 (!) 100/42 (!) 131/55  Pulse: 62 65 62 65  Resp: (!) 28 (!) 30 (!) 21 (!) 25  Temp:    98.1 F (36.7 C)  TempSrc:      SpO2: 98% 100% 98% 100%  Weight:    54.8 kg    Intake/Output Summary (Last 24 hours) at 10/09/2022 1250 Last data filed at 10/09/2022 1210 Gross per 24 hour  Intake 1714.1 ml  Output 70 ml  Net 1644.1 ml      10/09/2022   12:10 PM 10/09/2022    8:15 AM 10/09/2022    5:00 AM  Last 3 Weights  Weight (lbs) 120 lb 13 oz 120 lb 13 oz 120 lb 13 oz  Weight (kg) 54.8 kg 54.8 kg 54.8 kg     Body mass index is 24.4 kg/m.  General:  thin, chronically ill appearing female in no acute distress intubated on vent , nods her head in response to questions  HEENT: normal, ETT in place  Neck: no JVD Vascular: No carotid bruits; Distal pulses 2+ bilaterally Cardiac:  normal S1, S2; RRR; soft murmur no gallup rub or click Lungs:  thick rhonchi / rales suggestive of pneumonia  Abd: soft, nontender, no hepatomegaly  Ext: no to trace edema Musculoskeletal:  No deformities, BUE and BLE strength normal and equal Skin: warm and dry  Neuro:  lethargic sedated,  Psych:  Normal affect -asleep    Relevant CV Studies:  Echo limited 10/07/22 IMPRESSIONS     1. Left ventricular ejection fraction, by estimation, is 40 to 45%. Left  ventricular ejection fraction by 2D MOD biplane is 40.3 %. The left  ventricle has mildly decreased function. The left ventricle demonstrates  global hypokinesis.   2. Right ventricular systolic function is mildly reduced. There is  moderately elevated pulmonary artery systolic pressure. The estimated   right ventricular systolic pressure is 48.2 mmHg.   3. Mild mitral valve regurgitation.   4. Tricuspid valve regurgitation is mild to moderate.   5. The aortic valve is tricuspid. Aortic valve regurgitation is moderate  to severe.   6. The inferior vena cava is dilated in size with <50% respiratory  variability, suggesting right atrial pressure of 15 mmHg.   Comparison(s): No significant change from prior study. Prior images  reviewed side by side.   FINDINGS   Left Ventricle: Left ventricular ejection fraction, by estimation, is 40  to 45%. Left ventricular ejection fraction by 2D MOD biplane is  40.3 %.  The left ventricle has mildly decreased function. The left ventricle  demonstrates global hypokinesis.   Right Ventricle: Right ventricular systolic function is mildly reduced.  There is moderately elevated pulmonary artery systolic pressure. The  tricuspid regurgitant velocity is 2.88 m/s, and with an assumed right  atrial pressure of 15 mmHg, the estimated  right ventricular systolic pressure is 48.2 mmHg.   Mitral Valve: Mild mitral valve regurgitation.   Tricuspid Valve: Tricuspid valve regurgitation is mild to moderate.   Aortic Valve: The aortic valve is tricuspid. Aortic valve regurgitation is  moderate to severe. Aortic regurgitation PHT measures 270 msec.   Venous: The inferior vena cava is dilated in size with less than 50%  respiratory variability, suggesting right atrial pressure of 15 mmHg.   Additional Comments: A device lead is visualized in the right ventricle.  Spectral Doppler performed. Color Doppler performed.      Echo 10/03/22  IMPRESSIONS     1. Left ventricular ejection fraction, by estimation, is 40 to 45%. The  left ventricle has mildly decreased function. The left ventricle  demonstrates global hypokinesis. There is moderate concentric left  ventricular hypertrophy. Left ventricular  diastolic function could not be evaluated. There is the  interventricular  septum is flattened in systole and diastole, consistent with right  ventricular pressure and volume overload.   2. Right ventricular systolic function is mildly reduced. The right  ventricular size is moderately enlarged. There is severely elevated  pulmonary artery systolic pressure. The estimated right ventricular  systolic pressure is 74.3 mmHg.   3. Left atrial size was moderately dilated.   4. Right atrial size was mildly dilated.   5. The mitral valve is grossly normal. Mild to moderate mitral valve  regurgitation. No evidence of mitral stenosis.   6. The tricuspid valve is abnormal. Tricuspid valve regurgitation is  severe.   7. The aortic valve is tricuspid. Aortic valve regurgitation is moderate.  No aortic stenosis is present.   8. The inferior vena cava is dilated in size with <50% respiratory  variability, suggesting right atrial pressure of 15 mmHg.   Comparison(s): Changes from prior study are noted. LVEF 40-45% which is  slightly reduced from 50-55%. Severe TR. AI remains moderate.   FINDINGS   Left Ventricle: Left ventricular ejection fraction, by estimation, is 40  to 45%. The left ventricle has mildly decreased function. The left  ventricle demonstrates global hypokinesis. The left ventricular internal  cavity size was normal in size. There is   moderate concentric left ventricular hypertrophy. The interventricular  septum is flattened in systole and diastole, consistent with right  ventricular pressure and volume overload. Left ventricular diastolic  function could not be evaluated due to  nondiagnostic images. Left ventricular diastolic function could not be  evaluated.   Right Ventricle: The right ventricular size is moderately enlarged. No  increase in right ventricular wall thickness. Right ventricular systolic  function is mildly reduced. There is severely elevated pulmonary artery  systolic pressure. The tricuspid  regurgitant velocity  is 3.85 m/s, and with an assumed right atrial  pressure of 15 mmHg, the estimated right ventricular systolic pressure is  74.3 mmHg.   Left Atrium: Left atrial size was moderately dilated.   Right Atrium: Right atrial size was mildly dilated.   Pericardium: There is no evidence of pericardial effusion.   Mitral Valve: The mitral valve is grossly normal. Mild to moderate mitral  valve regurgitation. No evidence of mitral valve stenosis.  Tricuspid Valve: The tricuspid valve is abnormal. Tricuspid valve  regurgitation is severe. No evidence of tricuspid stenosis. The flow in  the hepatic veins is reversed during ventricular systole.   Aortic Valve: The aortic valve is tricuspid. Aortic valve regurgitation is  moderate. Aortic regurgitation PHT measures 376 msec. No aortic stenosis  is present. Aortic valve mean gradient measures 7.0 mmHg. Aortic valve  peak gradient measures 13.4 mmHg.  Aortic valve area, by VTI measures 1.05 cm.   Pulmonic Valve: The pulmonic valve was grossly normal. Pulmonic valve  regurgitation is not visualized. No evidence of pulmonic stenosis.   Aorta: The aortic root is normal in size and structure.   Venous: The inferior vena cava is dilated in size with less than 50%  respiratory variability, suggesting right atrial pressure of 15 mmHg.   IAS/Shunts: The atrial septum is grossly normal.   Additional Comments: A device lead is visualized in the right atrium and  right ventricle.   [facility] 06/23/20 [facility] Procedural Findings: Hemodynamics (mmHg) RA mean 21 RV 49/20 PA 59/24, mean 39 PCWP mean 26  Oxygen saturations: PA 64% AO 98%  Cardiac Output (Fick) 5.07  Cardiac Index (Fick) 3.23 PVR 2.6 WU  Cardiac Output (Thermo) 4.72  Cardiac Index (Thermo) 3.01  PVR 2.75 WU  PAPI 1.7  Rt and Lt cardiac cath 12/11/19  1. Normal left and right heart filling pressures.  2. Mild pulmonary hypertension.  3. Preserved cardiac output.  4. Mild  nonobstructive coronary disease.  5. CKD, used 20 cc contrast.    Current diuretic dose appears adequate, no changes.  40% pLAD stenosis prior to D1 take off  50% ostial D1 RCA and LCX no significant disease   Right Heart Pressures [facility] Procedural Findings: Hemodynamics (mmHg) RA mean 3 RV 41/10 PA 41/16, mean 27 PCWP mean 14 LV 143/21 AO 156/53  Oxygen saturations: PA 62% AO 100%  Cardiac Output (Fick) 4.35  Cardiac Index (Fick) 2.74 PVR 2.99 WU   Laboratory Data:  High Sensitivity Troponin:   Recent Labs  Lab 10/06/22 1735 10/06/22 2017 10/07/22 0710 10/08/22 1644 10/08/22 2006  TROPONINIHS 4,739* 6,527* 12,808* >24,000* >24,000*     Chemistry Recent Labs  Lab 10/07/22 0710 10/07/22 1304 10/07/22 1755 10/08/22 0553 10/08/22 1644 10/09/22 0034  NA 138  --   --  140  --  142  K 5.2*  --   --  3.9  --  3.9  CL 92*  --   --  97*  --  100  CO2 19*  --   --  23  --  22  GLUCOSE 167*  --   --  187*  --  162*  BUN 37*  --   --  64*  --  80*  CREATININE 4.65*  --   --  6.52*  --  7.50*  CALCIUM 8.7*  --   --  8.1*  --  7.6*  MG  --    < > 2.2 2.3 2.4  --   GFRNONAA 9*  --   --  6*  --  5*  ANIONGAP 27*  --   --  20*  --  20*   < > = values in this interval not displayed.    Recent Labs  Lab 10/07/22 0710 10/08/22 0553 10/09/22 0034  PROT 8.5* 7.2 7.1  ALBUMIN 3.5 2.9* 2.6*  AST 366* 262* 220*  ALT 540* 370* 303*  ALKPHOS 138* 104 92  BILITOT 4.7* 6.2* 7.1*  Lipids No results for input(s): "CHOL", "TRIG", "HDL", "LABVLDL", "LDLCALC", "CHOLHDL" in the last 168 hours.  Hematology Recent Labs  Lab 10/08/22 0553 10/08/22 1817 10/09/22 0034  WBC 14.6* 14.3* 10.4  RBC 3.96 3.80* 3.62*  HGB 11.4* 11.0* 10.3*  HCT 35.0* 34.0* 33.1*  MCV 88.4 89.5 91.4  MCH 28.8 28.9 28.5  MCHC 32.6 32.4 31.1  RDW 24.7* 25.2* 25.6*  PLT 69* 60* 57*   Thyroid No results for input(s): "TSH", "FREET4" in the last 168 hours.  BNPNo results for input(s): "BNP",  "PROBNP" in the last 168 hours.  DDimer No results for input(s): "DDIMER" in the last 168 hours.   Radiology/Studies:  Portable Chest xray  Result Date: 10/09/2022 CLINICAL DATA:  Respiratory failure. EXAM: PORTABLE CHEST 1 VIEW COMPARISON:  10/08/2018 for FINDINGS: ETT tip is stable above the carina. There is a right chest wall dialysis catheter with tips at the superior cavoatrial junction. Enteric tube tip is below the GE junction. Left chest wall ICD noted with leads in the right atrial appendage, coronary sinus and right ventricle. Stable cardiomediastinal contours. Bilateral interstitial and airspace opacities are unchanged in the interval. IMPRESSION: 1. Stable support apparatus. 2. No change in aeration to the lungs compared with previous exam. Electronically Signed   By: Signa Kell M.D.   On: 10/09/2022 07:14   Portable Chest x-ray  Result Date: 10/08/2022 CLINICAL DATA:  Intubated, enteric catheter placement EXAM: PORTABLE CHEST 1 VIEW COMPARISON:  10/08/2022 FINDINGS: Single frontal view of the chest demonstrates endotracheal tube overlying tracheal air column, tip 1.4 cm above carina. Enteric catheter passes below diaphragm, tip projecting over the gastric body. Right internal jugular dialysis catheter tip overlies superior vena cava. Multi lead pacer/AICD unchanged. Cardiac silhouette is enlarged stable. There is persistent central vascular congestion and bilateral perihilar airspace disease most consistent with edema. No effusion or pneumothorax. No acute bony abnormalities. IMPRESSION: 1. Support devices as above. 2. Persistent findings of pulmonary edema and congestive heart failure. Electronically Signed   By: Sharlet Salina M.D.   On: 10/08/2022 19:16   DG Chest Port 1 View  Result Date: 10/08/2022 CLINICAL DATA:  Respiratory distress EXAM: PORTABLE CHEST 1 VIEW COMPARISON:  10/07/2022 FINDINGS: Cardiac pacemaker. Right central venous catheter with tip over the low SVC region. No  pneumothorax. Shallow inspiration. Cardiac enlargement with mild pulmonary vascular congestion. Perihilar infiltrates are increasing since prior study, likely edema but could indicate pneumonia or aspiration in the appropriate clinical setting. No pleural effusions. Calcification of the aorta. IMPRESSION: 1. Cardiac enlargement with pulmonary vascular congestion. 2. Increasing perihilar infiltrates. Electronically Signed   By: Burman Nieves M.D.   On: 10/08/2022 18:25   DG CHEST PORT 1 VIEW  Result Date: 10/07/2022 CLINICAL DATA:  Follow-up pneumothorax. EXAM: PORTABLE CHEST 1 VIEW COMPARISON:  Oct 06, 2022 FINDINGS: The right-sided pneumothorax has largely resolved. There may be trace pneumothorax identified laterally in the apex. No left-sided pneumothorax. Stable support apparatus. Improving but persistent infiltrate in the right lung. Left lung is clear. No change in the cardiomediastinal silhouette. IMPRESSION: 1. The right-sided pneumothorax has largely resolved. There may be a trace pneumothorax laterally in the apex. Recommend attention on follow-up. 2. Improving but persistent infiltrate in the right lung. Recommend follow-up to resolution. 3. Stable support apparatus. Electronically Signed   By: Gerome Sam III M.D.   On: 10/07/2022 15:51   ECHOCARDIOGRAM LIMITED  Result Date: 10/07/2022    ECHOCARDIOGRAM LIMITED REPORT   Patient Name:   Morgan  Clay Date of Exam: 10/07/2022 Medical Rec #:  [id number]         Height:       59.0 in Accession #:    [id number]        Weight:       122.6 lb Date of Birth:  03-01-1946         BSA:          1.498 m Patient Age:    76 years          BP:           131/44 mmHg Patient Gender: F                 HR:           90 bpm. Exam Location:  Inpatient Procedure: 2D Echo, Color Doppler and Cardiac Doppler Indications:    Cardiac Arrest  History:        Patient has prior history of Echocardiogram examinations, most                 recent 10/03/2022. CHF,  Defibrillator, COPD; Risk                 Factors:Hypertension, Diabetes and Sleep Apnea.  Sonographer:    Irving Burton Senior RDCS Referring Phys: [id number] PETER E BABCOCK IMPRESSIONS  1. Left ventricular ejection fraction, by estimation, is 40 to 45%. Left ventricular ejection fraction by 2D MOD biplane is 40.3 %. The left ventricle has mildly decreased function. The left ventricle demonstrates global hypokinesis.  2. Right ventricular systolic function is mildly reduced. There is moderately elevated pulmonary artery systolic pressure. The estimated right ventricular systolic pressure is 48.2 mmHg.  3. Mild mitral valve regurgitation.  4. Tricuspid valve regurgitation is mild to moderate.  5. The aortic valve is tricuspid. Aortic valve regurgitation is moderate to severe.  6. The inferior vena cava is dilated in size with <50% respiratory variability, suggesting right atrial pressure of 15 mmHg. Comparison(s): No significant change from prior study. Prior images reviewed side by side. FINDINGS  Left Ventricle: Left ventricular ejection fraction, by estimation, is 40 to 45%. Left ventricular ejection fraction by 2D MOD biplane is 40.3 %. The left ventricle has mildly decreased function. The left ventricle demonstrates global hypokinesis. Right Ventricle: Right ventricular systolic function is mildly reduced. There is moderately elevated pulmonary artery systolic pressure. The tricuspid regurgitant velocity is 2.88 m/s, and with an assumed right atrial pressure of 15 mmHg, the estimated right ventricular systolic pressure is 48.2 mmHg. Mitral Valve: Mild mitral valve regurgitation. Tricuspid Valve: Tricuspid valve regurgitation is mild to moderate. Aortic Valve: The aortic valve is tricuspid. Aortic valve regurgitation is moderate to severe. Aortic regurgitation PHT measures 270 msec. Venous: The inferior vena cava is dilated in size with less than 50% respiratory variability, suggesting right atrial pressure of 15 mmHg.  Additional Comments: A device lead is visualized in the right ventricle. Spectral Doppler performed. Color Doppler performed.   LV Volumes (MOD)               Biplane EF (MOD) LV vol d, MOD    89.2 ml       LV Biplane EF:   Left A2C:                                            ventricular LV vol  d, MOD    71.1 ml                        ejection A4C:                                            fraction by LV vol s, MOD    48.4 ml                        2D MOD A2C:                                            biplane is LV vol s, MOD    42.3 ml                        40.3 %. A4C: LV SV MOD A2C:   40.8 ml LV SV MOD A4C:   71.1 ml LV SV MOD BP:    33.2 ml RIGHT VENTRICLE RV S prime:     8.05 cm/s TAPSE (M-mode): 1.4 cm AORTIC VALVE LVOT Vmax:   96.10 cm/s LVOT Vmean:  62.200 cm/s LVOT VTI:    0.146 m AI PHT:      270 msec TRICUSPID VALVE TR Peak grad:   33.2 mmHg TR Vmax:        288.00 cm/s  SHUNTS Systemic VTI: 0.15 m Donato Schultz MD Electronically signed by Donato Schultz MD Signature Date/Time: 10/07/2022/3:34:12 PM    Final    DG Abd Portable 1V  Result Date: 10/07/2022 CLINICAL DATA:  OG tube placement. EXAM: PORTABLE ABDOMEN - 1 VIEW COMPARISON:  Radiograph yesterday. CT 10/03/2022 FINDINGS: The enteric tube has been advanced, the tip and side port are now below the diaphragm in the stomach. Air within nondilated small bowel centrally. Moderate stool in the right colon. Calcified uterine fibroids. IMPRESSION: Enteric tube with tip and side-port below the diaphragm in the stomach. Electronically Signed   By: Narda Rutherford M.D.   On: 10/07/2022 01:10   DG Abd 1 View  Result Date: 10/07/2022 CLINICAL DATA:  Nasogastric tube placement EXAM: ABDOMEN - 1 VIEW COMPARISON:  CT 10/03/2022 FINDINGS: Enteric tube tip projects over the left upper quadrant consistent with location in the upper stomach. Scattered gas and stool in the colon with scattered gas-filled small bowel. No small or large bowel distention. No radiopaque  stones appreciated. Calcification in the pelvis consistent with uterine fibroid, better seen on prior CT. Cardiac enlargement. Lung bases are clear. IMPRESSION: 1. Enteric tube tip projects over the upper stomach. 2. Nonobstructive bowel gas pattern. Electronically Signed   By: Burman Nieves M.D.   On: 10/07/2022 00:07   DG CHEST PORT 1 VIEW  Result Date: 10/06/2022 CLINICAL DATA:  Tachypnea EXAM: PORTABLE CHEST 1 VIEW COMPARISON:  10/05/2022 FINDINGS: Pulmonary insufflation is stable. Patchy multifocal airspace infiltrates have progressed within the right mid lung zone and right lung base since prior examination, likely infectious or inflammatory. No pneumothorax or pleural effusion. Right internal jugular hemodialysis catheter tip noted within the superior vena cava. Mild cardiomegaly is stable. Left subclavian pacemaker defibrillator is unchanged. No acute bone abnormality. IMPRESSION: 1. Progressive multifocal pulmonary infiltrate, likely infectious or inflammatory.  2. Stable cardiomegaly. Electronically Signed   By: Helyn Numbers M.D.   On: 10/06/2022 17:16   DG CHEST PORT 1 VIEW  Result Date: 10/06/2022 CLINICAL DATA:  Acute hypoxic respiratory failure EXAM: PORTABLE CHEST 1 VIEW COMPARISON:  10/06/2022 FINDINGS: Endotracheal tube is seen 3.9 cm above the carina. Right internal jugular hemodialysis catheter tip noted at the superior cavoatrial junction. Pulmonary insufflation is stable. There is progressive multifocal airspace infiltrate, predominantly within the right mid and lower lung zone, likely infectious or inflammatory. Small right apical pneumothorax has developed. No mediastinal shift to suggest tension physiology. No pneumothorax on the left clear no pleural effusion. Mild cardiomegaly is stable. Left subclavian pacemaker defibrillator is unchanged. No acute bone abnormality. IMPRESSION: 1. Support tubes in appropriate position. 2. Interval development of small right apical pneumothorax.  3. Progressive multifocal airspace infiltrate, predominantly within the right mid and lower lung zone, likely infectious or inflammatory. Electronically Signed   By: Helyn Numbers M.D.   On: 10/06/2022 17:15   CT HEAD WO CONTRAST ( )  Result Date: 10/05/2022 CLINICAL DATA:  Mental status change, unknown cause. EXAM: CT HEAD WITHOUT CONTRAST TECHNIQUE: Contiguous axial images were obtained from the base of the skull through the vertex without intravenous contrast. RADIATION DOSE REDUCTION: This exam was performed according to the departmental dose-optimization program which includes automated exposure control, adjustment of the mA and/or kV according to patient size and/or use of iterative reconstruction technique. COMPARISON:  Head CT 10/02/2015. FINDINGS: Brain: No acute hemorrhage. Cortical gray-white differentiation is preserved. Patchy hypoattenuation of the cerebral white matter, most consistent with moderate chronic small-vessel disease. Prominence of the ventricles and sulci within normal limits for age. No extra-axial collection. No mass effect or midline shift. Vascular: No hyperdense vessel or unexpected calcification. Skull: No calvarial fracture or suspicious bone lesion. Skull base is unremarkable. Sinuses/Orbits: Unremarkable. Other: None. IMPRESSION: 1. No acute intracranial abnormality. 2. Moderate chronic small-vessel disease. Electronically Signed   By: Orvan Falconer M.D.   On: 10/05/2022 16:56     Assessment and Plan:   Post Arrest PEA with acute hypoxic respiratory failure/aspiration PNA and Rib fx superimposed on recent viral PNA aned ashtma exacerbation with hx of OSA and PH, traumatic pneumothorax following CPR and pseudomonas PNA  per CCM on Vent NSTEMI with hx of non obstructive disease coronary artery disease last cardiac cath 2021 with 40% pLAD disease at most.  Now with mildly elevated troponin on admit to 227,  hs troponin did bump to 12,000 on  10/07/22 and post arrest to  >24,000.   Echo since arrest with no change in EF from 40-45%--though EF 12/2021 was 50-55%.   Remote hx of NICM with decreased EF requiring BiVICD in 2008 with prior normalization of EF. Global hypokinesis.   - for now supportive care for elevated troponin.  No cath for now.   AI prior mod on Echo 10/07/22 is mod to severe.  PHT measures 270 msec.  Hx of BiVICD with gen change in 07/2022 (no lead change) - will ask MDT to interrogate with no monitor with arrest and from EKG episodic a flutter.  ESRD on HD per nephrology P a flutter - on eliquis 2.5 BID as outpt with hx of several DCCVs, appeared to be SR on admit then a flutter episodically will ask MDT rep to eval. - starting IV heparin now. Off amiodarone with elevated LFTs Hx GI bleeds with ulcers and AVMs. Follow H/H Thrombocytopenia/Anemic plts 57 today per CCM DM-2, acute metobolic  encephalopathy/hypothyroid with TSH in April 7.41 free T4 0.98post arrest per CCM Abd pain/cirrhosis/elevated LFTs on IV protonix per CCM  amiodarone on hold with elevated LFTs   Risk Assessment/Risk Scores:     TIMI Risk Score for Unstable Angina or Non-ST Elevation MI:   The patient's TIMI risk score is 4, which indicates a 20% risk of all cause mortality, new or recurrent myocardial infarction or need for urgent revascularization in the next 14 days.    CHA2DS2-VASc Score = 5   This indicates a 7.2% annual risk of stroke. The patient's score is based upon: CHF History: 1 HTN History: 1 Diabetes History: 1 Stroke History: 0 Vascular Disease History: 0 Age Score: 1 Gender Score: 1         For questions or updates, please contact [facility] Please consult [email or web address] for contact info under    Signed, Nada Boozer, NP  10/09/2022 12:50 PM  Attending Note:   The patient was seen and examined.  Agree with assessment and plan as noted above.  Changes made to the above note as needed.  Patient seen and independently examined with Nada Boozer, NP .   We discussed all aspects of the encounter. I agree with the assessment and plan as stated above.    Cardiac Arrest :   clinically sounds like a PEA arrest due to hypoxemia.   She had CPR , at least several minutes of downtime in the setting of ESRD so her troponin elevation would be expected.    At this point, I would continue with supportive care.  She has very thick rhonchi / rales - more C/W viral pneumonia and not pulmonary edema   We will have industry interrogate her device to better understand her underlying rhythm and to make sure we didn't miss an arrthymia that would suggest an ischemic etiology    I have spent a total of 40 minutes with patient reviewing hospital  notes , telemetry, EKGs, labs and examining patient as well as establishing an assessment and plan that was discussed with the patient.  > 50% of time was spent in direct patient care.    Vesta Mixer, Montez Hageman., MD, [facility] 10/09/2022, 1:50 PM [address],  [address] Office [phone] Pager [phone]

## 2022-10-09 NOTE — Evaluation (Signed)
Physical Therapy Evaluation Patient Details Name: Morgan Clay MRN: [id number] DOB: 1946/01/28 Today's Date: 10/09/2022  History of Present Illness  77 yo female presents to [facility] on 5/27 with SOB and hypoxia s/p HD. viral panel + for rhinovirus/Enterovirus. Course complicated by PEA arrest 5/31,ROSC achieved after 5 min of CPR 1 round of Epi. pt was intubated and transferred to ICU.  Most likely due to acute hypoxic hypercarb respiratory failure.  Sm Rt sided pneumothorax.  PMH: Liver cirrhosis, ESRD on HD MWF, Afib, hx of CVA, anemia, T2DM, HTN, ICD.  Clinical Impression   Pt presents with generalized weakness, impaired sitting balance, impaired activity tolerance, decreased alertness during session. Pt to benefit from acute PT to address deficits. Pt requiring max-total +2 assist for to/from EOB, pt sat EOB x8 minutes and returned to supine due to pt indicating dizziness and demonstrating signs of fatigue. At baseline pt reports independence with mobility, has an aide for cooking/cleaning. PT recommending intensive post-acute rehabilitation to return to PLOF.  PT to progress mobility as tolerated, and will continue to follow acutely.         Recommendations for follow up therapy are one component of a multi-disciplinary discharge planning process, led by the attending physician.  Recommendations may be updated based on patient status, additional functional criteria and insurance authorization.  Follow Up Recommendations       Assistance Recommended at Discharge Frequent or constant Supervision/Assistance  Patient can return home with the following  Two people to help with walking and/or transfers;Two people to help with bathing/dressing/bathroom    Equipment Recommendations Other (comment) (tbd)  Recommendations for Other Services       Functional Status Assessment Patient has had a recent decline in their functional status and demonstrates the ability to make significant improvements  in function in a reasonable and predictable amount of time.     Precautions / Restrictions Precautions Precautions: Fall Precaution Comments: ETT, tube feeding (paused during session), HD cath Restrictions Weight Bearing Restrictions: No      Mobility  Bed Mobility Overal bed mobility: Needs Assistance Bed Mobility: Supine to Sit, Rolling, Sit to Supine Rolling: Max assist   Supine to sit: Total assist, +2 for physical assistance, HOB elevated Sit to supine: Total assist, +2 for physical assistance, HOB elevated   General bed mobility comments: total assist +2 for all aspects supine<>sit, pt initiating with UEs during rolling. Rolling x3 for pericare, pt with loose stools x2.    Transfers                   General transfer comment: unable to attempt this date    Ambulation/Gait                  Stairs            Wheelchair Mobility    Modified Rankin (Stroke Patients Only)       Balance Overall balance assessment: Needs assistance Sitting-balance support: No upper extremity supported, Feet supported Sitting balance-Leahy Scale: Poor Sitting balance - Comments: preference for L lateral and posterior lean. EOB sitting x8 minutes, fatigues and reports feeling dizzy but BP stable (100s/60s) Postural control: Posterior lean, Left lateral lean                                   Pertinent Vitals/Pain Pain Assessment Pain Assessment: Faces Faces Pain Scale: Hurts a little bit Pain Location: LEs R>L  during bed mobility Pain Descriptors / Indicators: Sore, Discomfort Pain Intervention(s): Limited activity within patient's tolerance, Monitored during session, Repositioned    Home Living Family/patient expects to be discharged to:: Private residence Living Arrangements: Children Available Help at Discharge: Family;Available PRN/intermittently;Personal care attendant Type of Home: House Home Access: Level entry       Home Layout:  Two level;Able to live on main level with bedroom/bathroom Home Equipment: Rolling Walker (2 wheels);Rollator (4 wheels) Additional Comments: aide 8hrs/wkn for IADLs, pt completes ADLs. Lives with dtr. Pt lives in apt in first floor of home    Prior Function Prior Level of Function : Independent/Modified Independent               ADLs Comments: adie for IADLs, mod I for ADLs     Hand Dominance   Dominant Hand: Right    Extremity/Trunk Assessment   Upper Extremity Assessment Upper Extremity Assessment: Defer to OT evaluation    Lower Extremity Assessment Lower Extremity Assessment: Generalized weakness (can perform partial ROM heel slide, DF/PF)    Cervical / Trunk Assessment Cervical / Trunk Assessment: Normal  Communication   Communication: Expressive difficulties (using yes/no head nodds)  Cognition Arousal/Alertness: Lethargic Behavior During Therapy: Flat affect Overall Cognitive Status: Impaired/Different from baseline Area of Impairment: Orientation, Attention, Memory, Following commands, Safety/judgement, Awareness, Problem solving                 Orientation Level: Disoriented to, Time, Situation Current Attention Level: Focused   Following Commands: Follows one step commands with increased time Safety/Judgement: Decreased awareness of safety, Decreased awareness of deficits   Problem Solving: Difficulty sequencing, Requires verbal cues, Requires tactile cues General Comments: pt nods "yes" to ID of her name, location, does not know month or year or why she is here. Pt with quick yes/no response time if awake, periods of dozing off during session but wakes easily. Cognition difficult to assess given ETT        General Comments General comments (skin integrity, edema, etc.): 40%FiO2/PEEP 5, VSS including BP, SBP must remain >90    Exercises     Assessment/Plan    PT Assessment Patient needs continued PT services  PT Problem List Decreased  strength;Decreased mobility;Decreased activity tolerance;Decreased balance;Decreased knowledge of use of DME;Pain;Cardiopulmonary status limiting activity;Decreased cognition;Decreased coordination       PT Treatment Interventions DME instruction;Therapeutic activities;Gait training;Therapeutic exercise;Patient/family education;Balance training;Stair training;Neuromuscular re-education;Functional mobility training    PT Goals (Current goals can be found in the Care Plan section)  Acute Rehab PT Goals Patient Stated Goal: back to independence PT Goal Formulation: With patient/family Time For Goal Achievement: 10/23/22 Potential to Achieve Goals: Good    Frequency Min 3X/week     Co-evaluation               AM-PAC PT "6 Clicks" Mobility  Outcome Measure Help needed turning from your back to your side while in a flat bed without using bedrails?: A Lot Help needed moving from lying on your back to sitting on the side of a flat bed without using bedrails?: Total Help needed moving to and from a bed to a chair (including a wheelchair)?: Total Help needed standing up from a chair using your arms (e.g., wheelchair or bedside chair)?: Total Help needed to walk in hospital room?: Total Help needed climbing 3-5 steps with a railing? : Total 6 Click Score: 7    End of Session Equipment Utilized During Treatment: Other (comment) (vent) Activity Tolerance: Patient  limited by fatigue Patient left: in bed;with call bell/phone within reach;with bed alarm set;with family/visitor present;Other (comment);with SCD's reapplied (mitts reapplied) Nurse Communication: Mobility status PT Visit Diagnosis: Other abnormalities of gait and mobility (R26.89);Muscle weakness (generalized) (M62.81)    Time: 0981-1914 PT Time Calculation (min) (ACUTE ONLY): 37 min   Charges:   PT Evaluation $PT Eval Moderate Complexity: 1 Mod          Roye Gustafson S, PT DPT Acute Rehabilitation Services Secure Chat  Preferred  Office [phone]    Lamesha Tibbits Sheliah Plane 10/09/2022, 3:48 PM

## 2022-10-09 NOTE — Progress Notes (Signed)
PT Cancellation Note  Patient Details Name: Morgan Clay MRN: [id number] DOB: 04-Dec-1945   Cancelled Treatment:    Reason Eval/Treat Not Completed: Medical issues which prohibited therapy;Patient at procedure or test/unavailable - HD - per RN, therapies to check back this afternoon .  Marye Round, PT DPT Acute Rehabilitation Services Secure Chat Preferred  Office [phone]    Akshaya Toepfer Sheliah Plane 10/09/2022, 10:06 AM

## 2022-10-09 NOTE — Progress Notes (Signed)
OT Cancellation Note  Patient Details Name: Morgan Clay MRN: [id number] DOB: [birth year]   Cancelled Treatment:    Reason Eval/Treat Not Completed: Patient at procedure or test/ unavailable (HD - per RN, therapies to check back this afternoon)  Donia Pounds 10/09/2022, 9:00 AM

## 2022-10-09 NOTE — Progress Notes (Signed)
eLink Physician-Brief Progress Note Patient Name: Morgan Clay DOB: Aug 11, 1945 MRN: [id number]   Date of Service  10/09/2022  HPI/Events of Note  77 year old female presented with a PEA cardiac arrest and biventricular failure, respiratory failure who failed extubation with persistent shock.  40% FiO2 with 100% SpO2.  Low-dose norepinephrine at 4-5 mcg.  No drastic changes in hemodynamics.  eICU Interventions  Repeat ABG in a.m. and troponin at 24 hours-collect with morning labs.     Intervention Category Minor Interventions: Clinical assessment - ordering diagnostic tests  [name] 10/09/2022, 9:26 PM

## 2022-10-10 ENCOUNTER — Inpatient Hospital Stay (HOSPITAL_COMMUNITY): Payer: 59

## 2022-10-10 DIAGNOSIS — I469 Cardiac arrest, cause unspecified: Secondary | ICD-10-CM | POA: Diagnosis not present

## 2022-10-10 DIAGNOSIS — J9601 Acute respiratory failure with hypoxia: Secondary | ICD-10-CM | POA: Diagnosis not present

## 2022-10-10 LAB — CBC
HCT: 31.5 % — ABNORMAL LOW (ref 36.0–46.0)
Hemoglobin: 10.5 g/dL — ABNORMAL LOW (ref 12.0–15.0)
MCH: 29.2 pg (ref 26.0–34.0)
MCHC: 33.3 g/dL (ref 30.0–36.0)
MCV: 87.5 fL (ref 80.0–100.0)
Platelets: 67 10*3/uL — ABNORMAL LOW (ref 150–400)
RBC: 3.6 MIL/uL — ABNORMAL LOW (ref 3.87–5.11)
RDW: 26.1 % — ABNORMAL HIGH (ref 11.5–15.5)
WBC: 14.9 10*3/uL — ABNORMAL HIGH (ref 4.0–10.5)
nRBC: 9.9 % — ABNORMAL HIGH (ref 0.0–0.2)

## 2022-10-10 LAB — GLUCOSE, CAPILLARY
Glucose-Capillary: 180 mg/dL — ABNORMAL HIGH (ref 70–99)
Glucose-Capillary: 224 mg/dL — ABNORMAL HIGH (ref 70–99)
Glucose-Capillary: 203 mg/dL — ABNORMAL HIGH (ref 70–99)
Glucose-Capillary: 147 mg/dL — ABNORMAL HIGH (ref 70–99)
Glucose-Capillary: 146 mg/dL — ABNORMAL HIGH (ref 70–99)
Glucose-Capillary: 143 mg/dL — ABNORMAL HIGH (ref 70–99)

## 2022-10-10 LAB — COMPREHENSIVE METABOLIC PANEL
ALT: 249 U/L — ABNORMAL HIGH (ref 0–44)
AST: 205 U/L — ABNORMAL HIGH (ref 15–41)
Albumin: 2.4 g/dL — ABNORMAL LOW (ref 3.5–5.0)
Alkaline Phosphatase: 107 U/L (ref 38–126)
Anion gap: 18 — ABNORMAL HIGH (ref 5–15)
BUN: 43 mg/dL — ABNORMAL HIGH (ref 8–23)
CO2: 22 mmol/L (ref 22–32)
Calcium: 8.1 mg/dL — ABNORMAL LOW (ref 8.9–10.3)
Chloride: 95 mmol/L — ABNORMAL LOW (ref 98–111)
Creatinine, Ser: 4.55 mg/dL — ABNORMAL HIGH (ref 0.44–1.00)
GFR, Estimated: 10 mL/min — ABNORMAL LOW (ref >60)
Glucose, Bld: 215 mg/dL — ABNORMAL HIGH (ref 70–99)
Potassium: 3.4 mmol/L — ABNORMAL LOW (ref 3.5–5.1)
Sodium: 135 mmol/L (ref 135–145)
Total Bilirubin: 9.6 mg/dL — ABNORMAL HIGH (ref 0.3–1.2)
Total Protein: 6.8 g/dL (ref 6.5–8.1)

## 2022-10-10 LAB — POCT I-STAT 7, (LYTES, BLD GAS, ICA,H+H)
Acid-Base Excess: 1 mmol/L (ref 0.0–2.0)
Bicarbonate: 26.3 mmol/L (ref 20.0–28.0)
Calcium, Ion: 1.01 mmol/L — ABNORMAL LOW (ref 1.15–1.40)
HCT: 37 % (ref 36.0–46.0)
Hemoglobin: 12.6 g/dL (ref 12.0–15.0)
O2 Saturation: 98 %
Patient temperature: 98.3
Potassium: 3.2 mmol/L — ABNORMAL LOW (ref 3.5–5.1)
Sodium: 136 mmol/L (ref 135–145)
TCO2: 28 mmol/L (ref 22–32)
pCO2 arterial: 41.6 mmHg (ref 32–48)
pH, Arterial: 7.408 (ref 7.35–7.45)
pO2, Arterial: 111 mmHg — ABNORMAL HIGH (ref 83–108)

## 2022-10-10 LAB — TROPONIN I (HIGH SENSITIVITY): Troponin I (High Sensitivity): 4805 ng/L (ref <18)

## 2022-10-10 LAB — APTT: aPTT: 101 seconds — ABNORMAL HIGH (ref 24–36)

## 2022-10-10 MED ORDER — POTASSIUM CHLORIDE 10 MEQ/100ML IV SOLN
10.0000 meq | INTRAVENOUS | Status: AC
  Administered 2022-10-10 (×3): 10 meq via INTRAVENOUS
  Filled 2022-10-10 (×3): qty 100

## 2022-10-10 MED ORDER — LIDOCAINE HCL (PF) 1 % IJ SOLN
INTRAMUSCULAR | Status: AC
  Filled 2022-10-10: qty 5

## 2022-10-10 MED ORDER — FENTANYL CITRATE PF 50 MCG/ML IJ SOSY
200.0000 ug | PREFILLED_SYRINGE | Freq: Once | INTRAMUSCULAR | Status: AC
  Administered 2022-10-10: 150 ug via INTRAVENOUS
  Filled 2022-10-10: qty 4

## 2022-10-10 MED ORDER — HEPARIN (PORCINE) 25000 UT/250ML-% IV SOLN
400.0000 [IU]/h | INTRAVENOUS | Status: DC
  Administered 2022-10-11: 400 [IU]/h via INTRAVENOUS
  Filled 2022-10-10: qty 250

## 2022-10-10 MED ORDER — MIDAZOLAM HCL 2 MG/2ML IJ SOLN
5.0000 mg | Freq: Once | INTRAMUSCULAR | Status: AC
  Administered 2022-10-10: 2 mg via INTRAVENOUS
  Filled 2022-10-10: qty 6

## 2022-10-10 MED ORDER — INSULIN GLARGINE-YFGN 100 UNIT/ML ~~LOC~~ SOLN
5.0000 [IU] | Freq: Every day | SUBCUTANEOUS | Status: DC
  Administered 2022-10-10 – 2022-10-14 (×4): 5 [IU] via SUBCUTANEOUS
  Filled 2022-10-10 (×6): qty 0.05

## 2022-10-10 MED ORDER — ROCURONIUM BROMIDE 10 MG/ML (PF) SYRINGE
PREFILLED_SYRINGE | INTRAVENOUS | Status: AC
  Administered 2022-10-10: 100 mg via INTRAVENOUS
  Filled 2022-10-10: qty 10

## 2022-10-10 MED ORDER — POTASSIUM CHLORIDE 10 MEQ/100ML IV SOLN
10.0000 meq | INTRAVENOUS | Status: AC
  Administered 2022-10-10 – 2022-10-11 (×2): 10 meq via INTRAVENOUS
  Filled 2022-10-10 (×2): qty 100

## 2022-10-10 MED ORDER — SODIUM CHLORIDE 0.9 % IV SOLN
1.0000 g | INTRAVENOUS | Status: AC
  Administered 2022-10-10 – 2022-10-13 (×4): 1 g via INTRAVENOUS
  Filled 2022-10-10 (×4): qty 1

## 2022-10-10 MED ORDER — ETOMIDATE 2 MG/ML IV SOLN
20.0000 mg | Freq: Once | INTRAVENOUS | Status: AC
  Administered 2022-10-10: 20 mg via INTRAVENOUS
  Filled 2022-10-10: qty 10

## 2022-10-10 MED ORDER — LIDOCAINE-EPINEPHRINE (PF) 2 %-1:200000 IJ SOLN
INTRAMUSCULAR | Status: AC
  Filled 2022-10-10: qty 20

## 2022-10-10 MED ORDER — CALCIUM GLUCONATE-NACL 2-0.675 GM/100ML-% IV SOLN
2.0000 g | Freq: Once | INTRAVENOUS | Status: AC
  Administered 2022-10-10: 2000 mg via INTRAVENOUS
  Filled 2022-10-10: qty 100

## 2022-10-10 MED ORDER — ROCURONIUM BROMIDE 50 MG/5ML IV SOLN: 100.0000 mg | Freq: Once | INTRAVENOUS | Status: AC

## 2022-10-10 MED ORDER — PHENYLEPHRINE 80 MCG/ML (10ML) SYRINGE FOR IV PUSH (FOR BLOOD PRESSURE SUPPORT)
PREFILLED_SYRINGE | INTRAVENOUS | Status: AC
  Filled 2022-10-10: qty 10

## 2022-10-10 MED ORDER — POTASSIUM CHLORIDE 20 MEQ PO PACK
40.0000 meq | PACK | Freq: Once | ORAL | Status: DC
  Filled 2022-10-10: qty 2

## 2022-10-10 NOTE — Progress Notes (Signed)
NAME:  Morgan Clay, MRN:  [id number], DOB:  08-Apr-1946, LOS: 8 ADMISSION DATE:  10/02/2022, CONSULTATION DATE:  10/02/22 REFERRING MD:  Ernie Avena, MD CHIEF COMPLAINT:  Respiratory Failure  History of Present Illness:  Morgan Clay is a 77 year old woman, never smoker with history of heart failure, pulmonary hypertension, asthma, ESRD on HD, cirrhosis secondary to hepatitis C s/p Harvoni treatment, atrial fibrillation and esophageal varices/small bowel AVMs who presented to [facility] with shortness of breath after dialysis, she was tachypneic with an SpO2 on arrival in the ER.   She was noted to have Lactic acid of 7.7 and was given a bolus with repeat lactic acid 7.6. Due to increase work of breathing she was placed on non-rebreather mask. She was given duoneb and solumedrol 125mg  IV. CTA Chest was negative for PE, there was motion artifact but mosaic attenuation of the pulmonary parenchyma. CT Neck showed possible mild pharyngeal mucosal swelling, but again difficult due to motion artifact. Neck CT was done for neck swelling. CT abdomen done for abdominal pain with no acute findings.   PCCM consulted as patient remained tachypneic with increased work of breathing after transfer from non-rebreather to [facility].   Patient accepted in transfer from [facility] to [facility] ICU.  Pertinent  Medical History   Asthma Pulmonary Hypertension OSA Chronic Systolic Heart Failure Atrial Fibrillation DMII ESRD on HD Cirrhosis Hepatitis C s/p Harvoni Esophageal Varices Duodenal AVM  Significant Hospital Events: Including procedures, antibiotic start and stop dates in addition to other pertinent events   5/27 presented to [place] [place], transferred to [facility] 5/28 remains on 12L, stable, viral panel + for rhinovirus/Enterovirus  5/29 lactic improving, surgery following serial abdominal exams, down to 4L [place].  5/30 down to room air. Initial resp failure felt  2/2 rhinovirus pneumonitis. Lactate cleared felt 2/2 generalized multiorgan failure which was slow to clear d/t cirrhosis. Remained encephalopathic  5/31 surgery cleared for diet and signed off. Went to [employer]. Completed HD was found by family unresponsive. Not initially on tele. Staff called to room. She had no pulse. Rhythm PEA. Underwent 5 minutes of ACLS 1 round epi before ROSC. Did not regain LOC so intubated and transferred to ICU. Copious oral secretions noted during intubation 6/2 extubated and required reintubation later the same day  Interim History / Subjective:  No overnight issues. On and off pressors - currently 7 mcg norepinephrine  Objective   Blood pressure (!) 121/42, pulse 72, temperature 98 F (36.7 C), temperature source Axillary, resp. rate 17, weight 56.5 kg, SpO2 100 %.    Vent Mode: PSV;CPAP FiO2 (%):  [40 %] 40 % Set Rate:  [30 bmp] 30 bmp Vt Set:  [350 mL] 350 mL PEEP:  [5 cmH20] 5 cmH20 Pressure Support:  [10 cmH20] 10 cmH20 Plateau Pressure:  [20 cmH20] 20 cmH20   Intake/Output Summary (Last 24 hours) at 10/10/2022 0950 Last data filed at 10/10/2022 0800 Gross per 24 hour  Intake 1372.47 ml  Output 70 ml  Net 1302.47 ml   Filed Weights   10/09/22 0815 10/09/22 1210 10/10/22 0351  Weight: 54.8 kg 54.8 kg 56.5 kg   Gen:      Intubated, sedated, acutely ill appearing HEENT:  ETT to vent Lungs:    sounds of mechanical ventilation auscultated no wheeze CV:         RRR Abd:      + bowel sounds; soft, non-tender; no palpable masses, no distension  Ext:    No edema Skin:      Warm and dry; no rashes Neuro:   RASS 0 to -1, weak but follows commands    Resolved Hospital Problem list     Lactic acidosis Hyperkalemia Traumatic pneumothorax  Assessment & Plan:   Acute hypoxic Respiratory Failure s/p PEA arrest c/b aspiration PNA and rib fractures 5/31.superimposed on recent  viral pneumonia and asthma exacerbation and h/o OSA and PH.  Pseudomonas  Pneumonia Mixed septic/cardiogenic shock Plan Sensitivities reviewed - will change to ceftazidime for total 7 days for pneumonia Will likely require tracheostomy due to failed extubation - suspect will take weeks to liberate from ventilator given her pain from rib fractures.   Circulatory Shock s/p cardiac arrest w/ known Pulmonary Hypertension,  LVH, HFrEF right heart failure and aortic insuff. History of BiV-ICD with CRT.  Suspected Hypoxemia mediated PEA cardiac arrest in hospital 5/31. Time to ROSC 5 minutes.  NSTEMI Paroxysmal Atrial fib  Plan  - continue heparin gtt - cardiology consulted, will need LHC at some point once medically optimized -   Anemia of Chronic Disease Melenontic stools Plan Cbc Transfuse for hgb < 7  Not requiring transfusions at this time. Continue bid ppi  Acute metabolic encephalopathy now w/ concern for anoxic brain injury s/p arrest has had on-going delirium,  Was confused 5/31 not following commands CT head was neg. Ammonia was nml  Plan PAD protocol Hold all sedation  Type 2 DM - Resume tube feeds this am - continue SSI, may need to add long acting once these are at goal.   Abdominal Pain Cirrhosis Elevated Liver Enzymes - initial CTA without acute pathology - Liver enzymes elevated in setting of hepatic congestion due to Right heart failure and hypoxemia TFs  ESRD on HD Plan Nephro following HD 6/3 with some vasopressors.   Best Practice (right click and "Reselect all SmartList Selections" daily)   Diet/type: NPO w/ meds via tube DVT prophylaxis: SCD GI prophylaxis: N/A Lines: N/A Foley:  N/A Code Status:  full code Last date of multidisciplinary goals of care discussion [daughter and sister updated at bedside 6/3]  The patient is critically ill due to respiratory failure.  Critical care was necessary to treat or prevent imminent or life-threatening deterioration.  Critical care was time spent personally by me on the following  activities: development of treatment plan with patient and/or surrogate as well as nursing, discussions with consultants, evaluation of patient's response to treatment, examination of patient, obtaining history from patient or surrogate, ordering and performing treatments and interventions, ordering and review of laboratory studies, ordering and review of radiographic studies, pulse oximetry, re-evaluation of patient's condition and participation in multidisciplinary rounds.   Critical Care Time devoted to patient care services described in this note is 33 minutes. This time reflects time of care of this signee Charlott Holler . This critical care time does not reflect separately billable procedures or procedure time, teaching time or supervisory time of PA/NP/Med student/Med Resident etc but could involve care discussion time.       Charlott Holler [facility] 10/10/2022 9:51 AM  Pager: see AMION  If no response to pager , please call critical care on call (see AMION) until 7pm After 7:00 pm call Elink

## 2022-10-10 NOTE — Progress Notes (Signed)
ANTICOAGULATION CONSULT NOTE  Pharmacy Consult for IV Heparin Indication: chest pain/ACS  No Known Allergies  Patient Measurements: Weight: 56.5 kg (124 lb 9 oz) Heparin Dosing Weight: 54.8 kg  Vital Signs: Temp: 98.3 F (36.8 C) (06/04 0327) Temp Source: Oral (06/04 0327) BP: 131/42 (06/04 0700) Pulse Rate: 65 (06/04 0700)  Labs: Recent Labs    10/08/22 0553 10/08/22 1644 10/08/22 1817 10/08/22 2006 10/09/22 0034 10/09/22 1108 10/09/22 1833 10/10/22 0422 10/10/22 0509  HGB 11.4*  --    < >  --  10.3*  --  10.2* 10.5* 12.6  HCT 35.0*  --    < >  --  33.1*  --  31.6* 31.5* 37.0  PLT 69*  --    < >  --  57*  --  56* 67*  --   APTT  --   --   --   --   --  51* 127* 101*  --   HEPARINUNFRC  --   --   --   --   --  <0.10*  --   --   --   CREATININE 6.52*  --   --   --  7.50*  --   --  4.55*  --   TROPONINIHS  --  >24,000*  --  >24,000*  --   --   --  4,805*  --    < > = values in this interval not displayed.     Estimated Creatinine Clearance: 8.3 mL/min (A) (by C-G formula based on SCr of 4.55 mg/dL (H)).    Assessment: 50 years age female s/p cardiac arrest with elevated troponin. Platelets noted to be low with dark stools overnight 6/3. Discussed with Dr. Celine Mans - starting IV heparin for ACS without bolus due to bleed risk.  Currently using aPTT to guide heparin dosing because elevated bilirubin could falsely elevate heparin levels.  aPTT high normal at 101 sec.  Goal of Therapy:  Heparin level 0.3-0.7 units/ml Monitor platelets by anticoagulation protocol: Yes   Plan:  Reduce heparin infusion slightly to 400 units/hr Recheck confirmatory aPTT in 8 hrs Daily aPTT and CBC Monitor closely for s/sx of bleeding  Dick Hark D. Laney Potash, PharmD, BCPS, BCCCP 10/10/2022, 9:02 AM

## 2022-10-10 NOTE — Procedures (Signed)
Diagnostic Bronchoscopy  Morgan Clay  [id number]  06-Dec-1945  Date:10/10/22  Time:3:28 PM   Provider Performing:Bawi Lakins Humphrey Rolls   Procedure: Diagnostic Bronchoscopy (62952)  Indication(s) Assist with direct visualization of tracheostomy placement  Consent Risks of the procedure as well as the alternatives and risks of each were explained to the patient and/or caregiver.  Consent for the procedure was obtained.   Anesthesia See separate tracheostomy note   Time Out Verified patient identification, verified procedure, site/side was marked, verified correct patient position, special equipment/implants available, medications/allergies/relevant history reviewed, required imaging and test results available.   Sterile Technique Usual hand hygiene, masks, gowns, and gloves were used   Procedure Description Bronchoscope advanced through endotracheal tube and into airway.  After suctioning out tracheal secretions, bronchoscope used to provide direct visualization of tracheostomy placement.   Complications/Tolerance None; patient tolerated the procedure well.   EBL None  Specimen(s) None  Durel Salts, MD Pulmonary and Critical Care Medicine [facility] 10/10/2022 3:28 PM Pager: see AMION  If no response to pager, please call critical care on call (see AMION) until 7pm After 7:00 pm call Elink

## 2022-10-10 NOTE — Progress Notes (Addendum)
Patient ID: Roderica Mahin, female   DOB: 01-24-1946, 77 y.o.   MRN: [id number] S: tolerated HD yesterday with levophed support just to keep even.   O:BP (!) 110/41   Pulse 78   Temp 98 F (36.7 C) (Axillary)   Resp 17   Wt 56.5 kg   SpO2 100%   BMI 25.16 kg/m   Intake/Output Summary (Last 24 hours) at 10/10/2022 1035 Last data filed at 10/10/2022 1000 Gross per 24 hour  Intake 1464.54 ml  Output 70 ml  Net 1394.54 ml   Intake/Output: I/O last 3 completed shifts: In: 2484.3 [I.V.:1658.8; NG/GT:625; IV Piggyback:200.5] Out: 70 [Emesis/NG output:70]  Intake/Output this shift:  Total I/O In: 314.8 [I.V.:107.4; NG/GT:157.3; IV Piggyback:50] Out: -  Weight change: 0 kg WUJ:[id number] and sedated CVS: RRR Resp:ventilated BS bilaterally with scattered rhonchi Abd: +BS, sot, NT/ND Ext: no edema, LUE AVF +T/B, RIJ TDC in place  Recent Labs  Lab 10/03/22 1617 10/03/22 1935 10/04/22 0058 10/05/22 0315 10/06/22 0229 10/06/22 1617 10/06/22 1735 10/07/22 0430 10/07/22 0710 10/07/22 1304 10/07/22 1755 10/08/22 0553 10/08/22 1644 10/09/22 0034 10/10/22 0422 10/10/22 0509  NA  --    < > 128* 135 138   < > 139 137 138  --   --  140  --  142 135 136  K  --    < > 5.3* 4.9 4.8   < > 4.0 4.4 5.2*  --   --  3.9  --  3.9 3.4* 3.2*  CL  --    < > 85* 93* 90*  --  90*  --  92*  --   --  97*  --  100 95*  --   CO2  --    < > 20* 20* 21*  --  19*  --  19*  --   --  23  --  22 22  --   GLUCOSE  --    < > 117* 122* 159*  --  270*  --  167*  --   --  187*  --  162* 215*  --   BUN  --    < > 46* 34* 64*  --  27*  --  37*  --   --  64*  --  80* 43*  --   CREATININE  --    < > 6.89* 4.95* 7.14*  --  3.69*  --  4.65*  --   --  6.52*  --  7.50* 4.55*  --   ALBUMIN  --   --  3.8  --  3.7  --  3.2*  --  3.5  --   --  2.9*  --  2.6* 2.4*  --   CALCIUM  --    < > 8.3* 8.4* 8.3*  --  7.4*  --  8.7*  --   --  8.1*  --  7.6* 8.1*  --   PHOS 6.0*  --   --   --   --   --  7.9*  --   --  6.9* 5.9* 6.1*  5.4*  --   --   --   AST  --   --  1,079*  --  314*  --  321*  --  366*  --   --  262*  --  220* 205*  --   ALT  --   --  887*  --  633*  --  515*  --  540*  --   --  370*  --  303* 249*  --    < > = values in this interval not displayed.   Liver Function Tests: Recent Labs  Lab 10/08/22 0553 10/09/22 0034 10/10/22 0422  AST 262* 220* 205*  ALT 370* 303* 249*  ALKPHOS 104 92 107  BILITOT 6.2* 7.1* 9.6*  PROT 7.2 7.1 6.8  ALBUMIN 2.9* 2.6* 2.4*   No results for input(s): "LIPASE", "AMYLASE" in the last 168 hours. Recent Labs  Lab 10/06/22 0229  AMMONIA 25   CBC: Recent Labs  Lab 10/05/22 0315 10/06/22 0229 10/06/22 1735 10/07/22 0122 10/08/22 0553 10/08/22 1817 10/09/22 0034 10/09/22 1833 10/10/22 0422 10/10/22 0509  WBC 8.7   < > 12.1*   < > 14.6* 14.3* 10.4 13.3* 14.9*  --   NEUTROABS 8.4*  --  11.5*  --   --   --   --   --   --   --   HGB 12.5   < > 11.4*   < > 11.4* 11.0* 10.3* 10.2* 10.5* 12.6  HCT 38.8   < > 36.3   < > 35.0* 34.0* 33.1* 31.6* 31.5* 37.0  MCV 88.2   < > 91.2   < > 88.4 89.5 91.4 89.0 87.5  --   PLT 123*   < > 94*   < > 69* 60* 57* 56* 67*  --    < > = values in this interval not displayed.   Cardiac Enzymes: No results for input(s): "CKTOTAL", "CKMB", "CKMBINDEX", "TROPONINI" in the last 168 hours. CBG: Recent Labs  Lab 10/09/22 1540 10/09/22 1928 10/09/22 2314 10/10/22 0315 10/10/22 0736  GLUCAP 159* 159* 173* 180* 224*    Iron Studies: No results for input(s): "IRON", "TIBC", "TRANSFERRIN", "FERRITIN" in the last 72 hours. Studies/Results: Portable Chest xray  Result Date: 10/09/2022 CLINICAL DATA:  Respiratory failure. EXAM: PORTABLE CHEST 1 VIEW COMPARISON:  10/08/2018 for FINDINGS: ETT tip is stable above the carina. There is a right chest wall dialysis catheter with tips at the superior cavoatrial junction. Enteric tube tip is below the GE junction. Left chest wall ICD noted with leads in the right atrial appendage, coronary  sinus and right ventricle. Stable cardiomediastinal contours. Bilateral interstitial and airspace opacities are unchanged in the interval. IMPRESSION: 1. Stable support apparatus. 2. No change in aeration to the lungs compared with previous exam. Electronically Signed   By: Signa Kell M.D.   On: 10/09/2022 07:14   Portable Chest x-ray  Result Date: 10/08/2022 CLINICAL DATA:  Intubated, enteric catheter placement EXAM: PORTABLE CHEST 1 VIEW COMPARISON:  10/08/2022 FINDINGS: Single frontal view of the chest demonstrates endotracheal tube overlying tracheal air column, tip 1.4 cm above carina. Enteric catheter passes below diaphragm, tip projecting over the gastric body. Right internal jugular dialysis catheter tip overlies superior vena cava. Multi lead pacer/AICD unchanged. Cardiac silhouette is enlarged stable. There is persistent central vascular congestion and bilateral perihilar airspace disease most consistent with edema. No effusion or pneumothorax. No acute bony abnormalities. IMPRESSION: 1. Support devices as above. 2. Persistent findings of pulmonary edema and congestive heart failure. Electronically Signed   By: Sharlet Salina M.D.   On: 10/08/2022 19:16   DG Chest Port 1 View  Result Date: 10/08/2022 CLINICAL DATA:  Respiratory distress EXAM: PORTABLE CHEST 1 VIEW COMPARISON:  10/07/2022 FINDINGS: Cardiac pacemaker. Right central venous catheter with tip over the low SVC region. No pneumothorax. Shallow inspiration. Cardiac enlargement with mild pulmonary vascular congestion. Perihilar infiltrates are increasing  since prior study, likely edema but could indicate pneumonia or aspiration in the appropriate clinical setting. No pleural effusions. Calcification of the aorta. IMPRESSION: 1. Cardiac enlargement with pulmonary vascular congestion. 2. Increasing perihilar infiltrates. Electronically Signed   By: Burman Nieves M.D.   On: 10/08/2022 18:25    Chlorhexidine Gluconate Cloth  6 each  Topical Q0600   Chlorhexidine Gluconate Cloth  6 each Topical Q0600   docusate  100 mg Per Tube BID   doxercalciferol  4 mcg Intravenous Q M,W,F-HD   feeding supplement (PROSource TF20)  60 mL Per Tube Daily   insulin aspart  0-15 Units Subcutaneous Q4H   insulin glargine-yfgn  5 Units Subcutaneous Daily   ipratropium-albuterol  3 mL Nebulization Q6H   levothyroxine  125 mcg Per Tube Q0600   lidocaine  1 patch Transdermal Q24H   multivitamin  1 tablet Per Tube QHS   mouth rinse  15 mL Mouth Rinse Q2H   pantoprazole (PROTONIX) IV  40 mg Intravenous Q12H   polyethylene glycol  17 g Per Tube Daily   thiamine  100 mg Per Tube Daily    BMET    Component Value Date/Time   NA 136 10/10/2022 0509   NA 140 12/25/2018 1514   K 3.2 (L) 10/10/2022 0509   CL 95 (L) 10/10/2022 0422   CO2 22 10/10/2022 0422   GLUCOSE 215 (H) 10/10/2022 0422   BUN 43 (H) 10/10/2022 0422   BUN 33 (H) 12/25/2018 1514   CREATININE 4.55 (H) 10/10/2022 0422   CREATININE 2.24 (H) 02/04/2020 1416   CALCIUM 8.1 (L) 10/10/2022 0422   GFRNONAA 10 (L) 10/10/2022 0422   GFRNONAA 21 (L) 02/04/2020 1416   GFRAA 25 (L) 02/04/2020 1416   CBC    Component Value Date/Time   WBC 14.9 (H) 10/10/2022 0422   RBC 3.60 (L) 10/10/2022 0422   HGB 12.6 10/10/2022 0509   HGB 11.0 (L) 06/19/2019 1010   HGB 8.4 (L) 10/04/2017 1534   HCT 37.0 10/10/2022 0509   HCT 26.1 (L) 10/04/2017 1534   PLT 67 (L) 10/10/2022 0422   PLT 192 06/19/2019 1010   PLT 201 10/04/2017 1534   MCV 87.5 10/10/2022 0422   MCV 87 10/04/2017 1534   MCH 29.2 10/10/2022 0422   MCHC 33.3 10/10/2022 0422   RDW 26.1 (H) 10/10/2022 0422   RDW 17.3 (H) 10/04/2017 1534   LYMPHSABS 0.4 (L) 10/06/2022 1735   LYMPHSABS 2.1 10/04/2017 1534   MONOABS 0.1 10/06/2022 1735   EOSABS 0.0 10/06/2022 1735   EOSABS 0.2 10/04/2017 1534   BASOSABS 0.0 10/06/2022 1735   BASOSABS 0.0 10/04/2017 1534     OP HD: SW MWF 3.5h  400/600   58.5kjg  2/2 bath  RIJ TDC  Heparin  none - last HD 5/27, post wt 57.9kg - sensipar 30mg  po tiw - hectorol 4 mcg IV tiw    Assessment/ Plan: Hypoxemic resp failure/ multifocal pna - was on 12 L HFNC initially then seemed to be improving but had code blue yest w/ CPR and intubation and ROSC at 5 min. F/u CXR showed increased bilat patchy opacities, likely multifocal infection/ inflammation. Bronch showed pus/ tenacious secretions. Is on IV abx per CCM. Extubated then reintubated same day 10/08/22.  May require trach per PCCM. ID - pna as above, also resp panel + for rhinovirus/ enterovirus Abd pain -  seen by gen surg, repeat abd CT negative. Surg signed off.  ESRD - on HD MWF. Last HD  Monday. Next HD tomorrow.  HTN/ volume - Keep even next HD.  Anemia esrd - Hb > 10 here here, no esa needs MBD ckd - CCa and phos in range. Cont po sensipar and IV vdra w/ HD.  H/o pHTN H/o atrial fib Vascular access - has LUE AVF in place as well as RIJ TDC.  AVF temp  unusable due to severe pain in left arm.  Continue with [facility] for now.   Irena Cords, MD [facility]

## 2022-10-10 NOTE — Procedures (Signed)
Percutaneous Tracheostomy Procedure Note   Morgan Clay  [id number]  [birth year]  Date:10/10/22  Time:3:02 PM   Provider Performing:Antoinett Dorman  Procedure: Percutaneous Tracheostomy with Bronchoscopic Guidance ([id number])  Indication(s) Acute respiratory failure  Consent Risks of the procedure as well as the alternatives and risks of each were explained to the patient and/or caregiver.  Consent for the procedure was obtained.  Anesthesia Etomidate, Versed, Fentanyl, Vecuronium   Time Out Verified patient identification, verified procedure, site/side was marked, verified correct patient position, special equipment/implants available, medications/allergies/relevant history reviewed, required imaging and test results available.   Sterile Technique Maximal sterile technique including sterile barrier drape, hand hygiene, sterile gown, sterile gloves, mask, hair covering.    Procedure Description Appropriate anatomy identified by palpation.  Patient's neck prepped and draped in sterile fashion.  1% lidocaine with epinephrine was used to anesthetize skin overlying neck.  1.5cm incision made and blunt dissection performed until tracheal rings could be easily palpated.   Then a size 6 Shiley tracheostomy was placed under bronchoscopic visualization using usual Seldinger technique and serial dilation.   Bronchoscope confirmed placement above the carina.  Tracheostomy was sutured in place with adhesive pad to protect skin under pressure.    Patient connected to ventilator.   Complications/Tolerance None; patient tolerated the procedure well. Chest X-ray is ordered to confirm no post-procedural complication.   EBL Minimal   Specimen(s) None

## 2022-10-10 NOTE — TOC Initial Note (Signed)
Transition of Care [facility]) - Initial/Assessment Note    Patient Details  Name: Morgan Clay MRN: [id number] Date of Birth: February 02, 1946  [facility] CM/SW Contact:    Lorri Frederick, LCSW Phone Number: 10/10/2022, 12:59 PM  Clinical Narrative:     CSW spoke with pt daughter Marylene Land regarding LTAC option.  Choice discussed, daughter would like to move forward with [facility].  Pt from home with Marylene Land.  HH aide services in place two days per week.                Expected Discharge Plan: Long Term Acute Care (LTAC) Barriers to Discharge: Continued Medical Work up   Patient Goals and CMS Choice     Choice offered to / list presented to : Adult Children (daughter Marylene Land)      Expected Discharge Plan and Services In-house Referral: Clinical Social Work   Post Acute Care Choice: Long Term Acute Care (LTAC) Living arrangements for the past 2 months: Single Family Home                                      Prior Living Arrangements/Services Living arrangements for the past 2 months: Single Family Home Lives with:: Adult Children (with daughter Marylene Land) Patient language and need for interpreter reviewed:: No        Need for Family Participation in Patient Care: Yes (Comment) Care giver support system in place?: Yes (comment) Current home services: Homehealth aide (2x week) Criminal Activity/Legal Involvement Pertinent to Current Situation/Hospitalization: No - Comment as needed  Activities of Daily Living      Permission Sought/Granted                  Emotional Assessment              Admission diagnosis:  COPD (chronic obstructive pulmonary disease) (HCC) [J44.9] Acute right-sided heart failure (HCC) [I50.811] Lactic acidosis [E87.20] Pulmonary hypertension (HCC) [I27.20] Elevated troponin [R79.89] COPD exacerbation (HCC) [J44.1] Acute respiratory failure with hypoxia (HCC) [J96.01] Acute hypoxemic respiratory failure (HCC)  [J96.01] Patient Active Problem List   Diagnosis Date Noted   COPD (chronic obstructive pulmonary disease) (HCC) 10/02/2022   Acute hypoxemic respiratory failure (HCC) 10/02/2022   Acute upper GI bleeding 06/07/2022   Upper airway cough syndrome 05/17/2022   DOE (dyspnea on exertion) 03/27/2022   Prolonged QT interval 03/06/2022   Acute lower GI bleeding 10/25/2021   Transient hypotension 10/25/2021   Arthritis 09/06/2021   Chronic combined systolic (congestive) and diastolic (congestive) heart failure (HCC) 09/06/2021   History of blood transfusion 09/06/2021   Lower GI bleeding 09/06/2021   Carpal tunnel syndrome, bilateral 06/09/2021   Lesion of ulnar nerve 06/09/2021   Polyneuropathy associated with underlying disease (HCC) 06/09/2021   ESRD on dialysis (HCC) 03/22/2021   Esophageal varices without bleeding (HCC) 12/23/2020   Atrial fibrillation with RVR (HCC) 11/17/2020   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) 09/24/2020   Bleeding pseudoaneurysm of left brachiocephalic AV fistula (HCC) 09/24/2020   Hepatic cirrhosis (HCC) 08/12/2020   Malnutrition of moderate degree 07/09/2020   Angiodysplasia of small intestine 06/10/2020   Acute blood loss anemia 05/27/2020   COVID-19 virus infection 05/27/2020   Anemia due to chronic blood loss 04/15/2020   Chronic anticoagulation    Persistent atrial fibrillation (HCC) 02/06/2020   Secondary hypercoagulable state (HCC) 02/06/2020   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)    Gout    Pain in right knee 05/13/2018   Anemia 01/18/2018   Pulmonary hypertension (HCC) on echocardiogram 01/14/2018   AVM (arteriovenous malformation) of small bowel, acquired 01/14/2018   Glaucoma 01/03/2018   Aortic atherosclerosis (HCC) 08/30/2017   Diverticulitis of left colon status post robotic low anterior to sigmoid resection 05/22/2018 07/31/2017   Hypertension associated with diabetes (HCC)  06/07/2017   MDD (major depressive disorder), recurrent, in partial remission (HCC) 06/07/2017   Asthma 05/22/2017   Chronic obstructive pulmonary disease (HCC)    Cough    AICD (automatic cardioverter/defibrillator) present 10/06/2015   Atypical atrial flutter (HCC) 09/28/2015   Type 2 diabetes, controlled, with renal manifestation (HCC) 11/24/2013   Nonischemic cardiomyopathy (HCC) 11/22/2010   DEPRESSION 12/17/2009   OSTEOPENIA 09/30/2008   FIBROIDS, UTERUS 03/05/2008   Chronic diastolic CHF (congestive heart failure) (HCC) 04/24/2007   Acquired hypothyroidism 01/28/2007   Biventricular ICD (implantable cardioverter-defibrillator) in place 01/08/2007   PCP:  Marisue Brooklyn Pharmacy:   [facility] [address] [place], [place] - [address] [place] [address] Phone: [phone] Fax: [phone]     Social Determinants of Health (SDOH) Social History: SDOH Screenings   Food Insecurity: No Food Insecurity (09/04/2022)  Housing: Low Risk  (09/04/2022)  Transportation Needs: No Transportation Needs (09/04/2022)  Utilities: Not At Risk (06/07/2022)  Alcohol Screen: Low Risk  (07/15/2021)  Depression (PHQ2-9): Low Risk  (04/06/2022)  Financial Resource Strain: Low Risk  (09/04/2022)  Physical Activity: Insufficiently Active (09/04/2022)  Social Connections: Moderately Isolated (09/04/2022)  Stress: Stress Concern Present (09/04/2022)  Tobacco Use: Low Risk  (10/02/2022)   SDOH Interventions:     Readmission Risk Interventions    06/29/2020    9:18 AM  Readmission Risk Prevention Plan  Transportation Screening Complete  HRI or Home Care Consult Complete  Social Work Consult for Recovery Care Planning/Counseling Complete  Palliative Care Screening Not Applicable  Medication Review Oceanographer) Complete

## 2022-10-10 NOTE — Inpatient Diabetes Management (Signed)
Inpatient Diabetes Program Recommendations  AACE/ADA: New Consensus Statement on Inpatient Glycemic Control   Target Ranges:  Prepandial:   less than 140 mg/dL      Peak postprandial:   less than 180 mg/dL (1-2 hours)      Critically ill patients:  140 - 180 mg/dL    Latest Reference Range & Units 10/10/22 03:15 10/10/22 07:36 10/10/22 11:31  Glucose-Capillary 70 - 99 mg/dL 161 (H) 096 (H) 045 (H)    Latest Reference Range & Units 10/09/22 07:33 10/09/22 11:10 10/09/22 15:40 10/09/22 19:28 10/09/22 23:14  Glucose-Capillary 70 - 99 mg/dL 409 (H) 811 (H) 914 (H) 159 (H) 173 (H)   Review of Glycemic Control  Diabetes history: DM2 Outpatient Diabetes medications: None Current orders for Inpatient glycemic control: Semglee 5 units daily, Novolog 0-15 units Q4H; Vital @ 40 ml/hr  Inpatient Diabetes Program Recommendations:    Insulin: Please consider ordering Novolog 3 units Q4H for tube feeding coverage. If tube feeding is stopped or held then Novolog tube feeding coverage should also be stopped or held.  Thanks, Orlando Penner, RN, MSN, CDCES Diabetes Coordinator Inpatient Diabetes Program [phone] (Team Pager from 8am to 5pm)

## 2022-10-10 NOTE — Progress Notes (Signed)
eLink Physician-Brief Progress Note Patient Name: Morgan Clay DOB: May 24, 1945 MRN: [id number]   Date of Service  10/10/2022  HPI/Events of Note  ABG result reviewed.  eICU Interventions  Calcium gluconate 2 gm iv x 1 ordered.        [name] 10/10/2022, 8:21 PM

## 2022-10-10 NOTE — Progress Notes (Signed)
ANTICOAGULATION CONSULT NOTE  Pharmacy Consult for IV Heparin Indication: chest pain/ACS  No Known Allergies  Patient Measurements: Weight: 56.5 kg (124 lb 9 oz) Heparin Dosing Weight: 54.8 kg  Vital Signs: Temp: 98.5 F (36.9 C) (06/04 1613) Temp Source: Oral (06/04 1613) BP: 105/40 (06/04 1515) Pulse Rate: 77 (06/04 1515)  Labs: Recent Labs    10/08/22 0553 10/08/22 1644 10/08/22 1817 10/08/22 2006 10/09/22 0034 10/09/22 1108 10/09/22 1833 10/10/22 0422 10/10/22 0509  HGB 11.4*  --    < >  --  10.3*  --  10.2* 10.5* 12.6  HCT 35.0*  --    < >  --  33.1*  --  31.6* 31.5* 37.0  PLT 69*  --    < >  --  57*  --  56* 67*  --   APTT  --   --   --   --   --  51* 127* 101*  --   HEPARINUNFRC  --   --   --   --   --  <0.10*  --   --   --   CREATININE 6.52*  --   --   --  7.50*  --   --  4.55*  --   TROPONINIHS  --  >24,000*  --  >24,000*  --   --   --  4,805*  --    < > = values in this interval not displayed.     Estimated Creatinine Clearance: 8.3 mL/min (A) (by C-G formula based on SCr of 4.55 mg/dL (H)).    Assessment: 70 years age female s/p cardiac arrest with elevated troponin. Platelets noted to be low with dark stools overnight 6/3. Discussed with Dr. Celine Mans - starting IV heparin for ACS without bolus due to bleed risk.  Currently using aPTT to guide heparin dosing because elevated bilirubin could falsely elevate heparin levels.  Heparin held for trach, ok to resume 2h after per Dr. Merrily Pew.  Goal of Therapy:  Heparin level 0.3-0.7 units/ml Monitor platelets by anticoagulation protocol: Yes   Plan:  Resume heparin 400 units/h Recheck aPTT with am labs  Fredonia Highland, PharmD, BCPS, [facility] Clinical Pharmacist [phone] Please check AMION for all [facility] Pharmacy numbers 10/10/2022

## 2022-10-10 NOTE — Progress Notes (Signed)
Rounding Note    Patient Name: Margine Ufford Date of Encounter: 10/10/2022  [facility] Cardiologist: Marca Ancona, MD   Subjective   77 yo with NICM, BI  V pacer, recurrent GI bleed, ESRD,  Admitted with respiratory failure due to rhinovirus S/p PEA arrest due to hypoxemia  Troponins went up after her PEA arrest / CPR   Echo shows EF 40-45% Moderate pulmonary HTN - PA pressure ~ 48 mmHg  Moderate - severe AI   ABG this am:   7.41/41.6/111 K = 3.2   She is awake and alter .    Inpatient Medications    Scheduled Meds:  Chlorhexidine Gluconate Cloth  6 each Topical Q0600   Chlorhexidine Gluconate Cloth  6 each Topical Q0600   docusate  100 mg Per Tube BID   doxercalciferol  4 mcg Intravenous Q M,W,F-HD   feeding supplement (PROSource TF20)  60 mL Per Tube Daily   insulin aspart  0-15 Units Subcutaneous Q4H   insulin glargine-yfgn  5 Units Subcutaneous Daily   ipratropium-albuterol  3 mL Nebulization Q6H   levothyroxine  125 mcg Per Tube Q0600   lidocaine  1 patch Transdermal Q24H   multivitamin  1 tablet Per Tube QHS   mouth rinse  15 mL Mouth Rinse Q2H   pantoprazole (PROTONIX) IV  40 mg Intravenous Q12H   polyethylene glycol  17 g Per Tube Daily   thiamine  100 mg Per Tube Daily   Continuous Infusions:  sodium chloride Stopped (10/07/22 1027)   sodium chloride     feeding supplement (VITAL 1.5 CAL) 40 mL/hr at 10/10/22 0800   heparin 450 Units/hr (10/10/22 0800)   norepinephrine (LEVOPHED) Adult infusion 5 mcg/min (10/10/22 0800)   piperacillin-tazobactam (ZOSYN)  IV 100 mL/hr at 10/10/22 0800   PRN Meds: Place/Maintain arterial line **AND** sodium chloride, docusate sodium, fentaNYL (SUBLIMAZE) injection, fentaNYL (SUBLIMAZE) injection, mineral oil-hydrophilic petrolatum, mouth rinse   Vital Signs    Vitals:   10/10/22 0745 10/10/22 0800 10/10/22 0815 10/10/22 0830  BP: (!) 141/41 (!) 126/43 (!) 121/39 (!) 119/39  Pulse: 71 78 71 70   Resp: (!) 26 (!) 25 (!) 27 20  Temp:      TempSrc:      SpO2: 94% 100% 100% 100%  Weight:        Intake/Output Summary (Last 24 hours) at 10/10/2022 0847 Last data filed at 10/10/2022 0800 Gross per 24 hour  Intake 1447.52 ml  Output 70 ml  Net 1377.52 ml      10/10/2022    3:51 AM 10/09/2022   12:10 PM 10/09/2022    8:15 AM  Last 3 Weights  Weight (lbs) 124 lb 9 oz 120 lb 13 oz 120 lb 13 oz  Weight (kg) 56.5 kg 54.8 kg 54.8 kg      Telemetry    V paced at 73  - Personally Reviewed  ECG     - Personally Reviewed  Physical Exam   GEN: No acute distress.   Neck: No JVD Cardiac: RRR, no murmurs, rubs, or gallops.  Respiratory: bilat rales, rhonchi  GI: Soft, nontender, non-distended  MS: No edema; No deformity. Neuro:  Nonfocal  Psych: Normal affect   Labs    High Sensitivity Troponin:   Recent Labs  Lab 10/06/22 2017 10/07/22 0710 10/08/22 1644 10/08/22 2006 10/10/22 0422  TROPONINIHS 6,527* 12,808* >24,000* >24,000* 4,805*     Chemistry Recent Labs  Lab 10/07/22 1755 10/08/22 0553 10/08/22 1644 10/09/22  0034 10/10/22 0422 10/10/22 0509  NA  --  140  --  142 135 136  K  --  3.9  --  3.9 3.4* 3.2*  CL  --  97*  --  100 95*  --   CO2  --  23  --  22 22  --   GLUCOSE  --  187*  --  162* 215*  --   BUN  --  64*  --  80* 43*  --   CREATININE  --  6.52*  --  7.50* 4.55*  --   CALCIUM  --  8.1*  --  7.6* 8.1*  --   MG 2.2 2.3 2.4  --   --   --   PROT  --  7.2  --  7.1 6.8  --   ALBUMIN  --  2.9*  --  2.6* 2.4*  --   AST  --  262*  --  220* 205*  --   ALT  --  370*  --  303* 249*  --   ALKPHOS  --  104  --  92 107  --   BILITOT  --  6.2*  --  7.1* 9.6*  --   GFRNONAA  --  6*  --  5* 10*  --   ANIONGAP  --  20*  --  20* 18*  --     Lipids No results for input(s): "CHOL", "TRIG", "HDL", "LABVLDL", "LDLCALC", "CHOLHDL" in the last 168 hours.  Hematology Recent Labs  Lab 10/09/22 0034 10/09/22 1833 10/10/22 0422 10/10/22 0509  WBC 10.4 13.3* 14.9*   --   RBC 3.62* 3.55* 3.60*  --   HGB 10.3* 10.2* 10.5* 12.6  HCT 33.1* 31.6* 31.5* 37.0  MCV 91.4 89.0 87.5  --   MCH 28.5 28.7 29.2  --   MCHC 31.1 32.3 33.3  --   RDW 25.6* 25.4* 26.1*  --   PLT 57* 56* 67*  --    Thyroid No results for input(s): "TSH", "FREET4" in the last 168 hours.  BNPNo results for input(s): "BNP", "PROBNP" in the last 168 hours.  DDimer No results for input(s): "DDIMER" in the last 168 hours.   Radiology    Portable Chest xray  Result Date: 10/09/2022 CLINICAL DATA:  Respiratory failure. EXAM: PORTABLE CHEST 1 VIEW COMPARISON:  10/08/2018 for FINDINGS: ETT tip is stable above the carina. There is a right chest wall dialysis catheter with tips at the superior cavoatrial junction. Enteric tube tip is below the GE junction. Left chest wall ICD noted with leads in the right atrial appendage, coronary sinus and right ventricle. Stable cardiomediastinal contours. Bilateral interstitial and airspace opacities are unchanged in the interval. IMPRESSION: 1. Stable support apparatus. 2. No change in aeration to the lungs compared with previous exam. Electronically Signed   By: Signa Kell M.D.   On: 10/09/2022 07:14   Portable Chest x-ray  Result Date: 10/08/2022 CLINICAL DATA:  Intubated, enteric catheter placement EXAM: PORTABLE CHEST 1 VIEW COMPARISON:  10/08/2022 FINDINGS: Single frontal view of the chest demonstrates endotracheal tube overlying tracheal air column, tip 1.4 cm above carina. Enteric catheter passes below diaphragm, tip projecting over the gastric body. Right internal jugular dialysis catheter tip overlies superior vena cava. Multi lead pacer/AICD unchanged. Cardiac silhouette is enlarged stable. There is persistent central vascular congestion and bilateral perihilar airspace disease most consistent with edema. No effusion or pneumothorax. No acute bony abnormalities. IMPRESSION: 1. Support devices as above.  2. Persistent findings of pulmonary edema and  congestive heart failure. Electronically Signed   By: Sharlet Salina M.D.   On: 10/08/2022 19:16   DG Chest Port 1 View  Result Date: 10/08/2022 CLINICAL DATA:  Respiratory distress EXAM: PORTABLE CHEST 1 VIEW COMPARISON:  10/07/2022 FINDINGS: Cardiac pacemaker. Right central venous catheter with tip over the low SVC region. No pneumothorax. Shallow inspiration. Cardiac enlargement with mild pulmonary vascular congestion. Perihilar infiltrates are increasing since prior study, likely edema but could indicate pneumonia or aspiration in the appropriate clinical setting. No pleural effusions. Calcification of the aorta. IMPRESSION: 1. Cardiac enlargement with pulmonary vascular congestion. 2. Increasing perihilar infiltrates. Electronically Signed   By: Burman Nieves M.D.   On: 10/08/2022 18:25    Cardiac Studies     Patient Profile     78 y.o. female   Assessment & Plan     PEA arrest:   subsequently had troponin levels > 24,000 I think she will need a cath at some point . Would want her further tuned up  I discussed with Dr. Celine Mans.  She will likely need a trach in order to come off the vent    Following     For questions or updates, please contact [facility] Please consult [email or web address] for contact info under        Signed, Kristeen Miss, MD  10/10/2022, 8:47 AM

## 2022-10-11 ENCOUNTER — Inpatient Hospital Stay (HOSPITAL_COMMUNITY): Payer: 59

## 2022-10-11 DIAGNOSIS — J9601 Acute respiratory failure with hypoxia: Secondary | ICD-10-CM | POA: Diagnosis not present

## 2022-10-11 LAB — COMPREHENSIVE METABOLIC PANEL
ALT: 229 U/L — ABNORMAL HIGH (ref 0–44)
AST: 207 U/L — ABNORMAL HIGH (ref 15–41)
Albumin: 2.2 g/dL — ABNORMAL LOW (ref 3.5–5.0)
Alkaline Phosphatase: 123 U/L (ref 38–126)
Anion gap: 20 — ABNORMAL HIGH (ref 5–15)
BUN: 60 mg/dL — ABNORMAL HIGH (ref 8–23)
CO2: 21 mmol/L — ABNORMAL LOW (ref 22–32)
Calcium: 8.8 mg/dL — ABNORMAL LOW (ref 8.9–10.3)
Chloride: 91 mmol/L — ABNORMAL LOW (ref 98–111)
Creatinine, Ser: 5.88 mg/dL — ABNORMAL HIGH (ref 0.44–1.00)
GFR, Estimated: 7 mL/min — ABNORMAL LOW (ref >60)
Glucose, Bld: 102 mg/dL — ABNORMAL HIGH (ref 70–99)
Potassium: 4.7 mmol/L (ref 3.5–5.1)
Sodium: 132 mmol/L — ABNORMAL LOW (ref 135–145)
Total Bilirubin: 11.2 mg/dL — ABNORMAL HIGH (ref 0.3–1.2)
Total Protein: 6.9 g/dL (ref 6.5–8.1)

## 2022-10-11 LAB — GLUCOSE, CAPILLARY
Glucose-Capillary: 101 mg/dL — ABNORMAL HIGH (ref 70–99)
Glucose-Capillary: 105 mg/dL — ABNORMAL HIGH (ref 70–99)
Glucose-Capillary: 127 mg/dL — ABNORMAL HIGH (ref 70–99)
Glucose-Capillary: 134 mg/dL — ABNORMAL HIGH (ref 70–99)
Glucose-Capillary: 139 mg/dL — ABNORMAL HIGH (ref 70–99)
Glucose-Capillary: 84 mg/dL (ref 70–99)

## 2022-10-11 LAB — CBC
HCT: 32.6 % — ABNORMAL LOW (ref 36.0–46.0)
Hemoglobin: 10.6 g/dL — ABNORMAL LOW (ref 12.0–15.0)
MCH: 29 pg (ref 26.0–34.0)
MCHC: 32.5 g/dL (ref 30.0–36.0)
MCV: 89.3 fL (ref 80.0–100.0)
Platelets: 81 10*3/uL — ABNORMAL LOW (ref 150–400)
RBC: 3.65 MIL/uL — ABNORMAL LOW (ref 3.87–5.11)
RDW: 26.4 % — ABNORMAL HIGH (ref 11.5–15.5)
WBC: 15.8 10*3/uL — ABNORMAL HIGH (ref 4.0–10.5)
nRBC: 7.3 % — ABNORMAL HIGH (ref 0.0–0.2)

## 2022-10-11 LAB — APTT: aPTT: 74 seconds — ABNORMAL HIGH (ref 24–36)

## 2022-10-11 MED ORDER — SODIUM CHLORIDE 0.9% FLUSH: 3.0000 mL | INTRAVENOUS | Status: DC | PRN

## 2022-10-11 MED ORDER — SODIUM CHLORIDE 0.9% FLUSH
3.0000 mL | Freq: Two times a day (BID) | INTRAVENOUS | Status: DC
  Administered 2022-10-11 – 2022-10-16 (×10): 3 mL via INTRAVENOUS

## 2022-10-11 MED ORDER — SODIUM CHLORIDE 0.9 % IV SOLN: INTRAVENOUS | Status: DC

## 2022-10-11 MED ORDER — OXYCODONE HCL 5 MG PO TABS
5.0000 mg | ORAL_TABLET | ORAL | Status: DC | PRN
  Administered 2022-10-11: 5 mg
  Filled 2022-10-11 (×2): qty 1

## 2022-10-11 MED ORDER — ASPIRIN 81 MG PO CHEW: 81.0000 mg | CHEWABLE_TABLET | Freq: Every day | ORAL | Status: DC

## 2022-10-11 MED ORDER — SODIUM CHLORIDE 0.9 % IV SOLN: 250.0000 mL | INTRAVENOUS | Status: DC | PRN

## 2022-10-11 MED ORDER — ASPIRIN 81 MG PO CHEW
81.0000 mg | CHEWABLE_TABLET | Freq: Every day | ORAL | Status: DC
  Administered 2022-10-11: 81 mg via ORAL
  Filled 2022-10-11: qty 1

## 2022-10-11 MED ORDER — ASPIRIN 81 MG PO CHEW
81.0000 mg | CHEWABLE_TABLET | ORAL | Status: AC
  Administered 2022-10-12: 81 mg via ORAL
  Filled 2022-10-11: qty 1

## 2022-10-11 NOTE — H&P (View-Only) (Signed)
  Advanced Heart Failure Team Consult Note   Primary Physician: Saguier, Edward, PA-C PCP-Cardiologist:  Ina Poupard, MD  Reason for Consultation: acute systolic heart failure, post PEA arrest  HPI:    Morgan Clay is seen today for evaluation of acute systolic heart failure, post PEA arrest, at the request of Dr. Nahser, Cardiology.   77 y.o. with history of nonischemic cardiomyopathy with recovery of EF with BiV pacing, recurrent GI bleeding due to gastric AVMs, paroxysmal atrial flutter, cirrhosis s/p treatment with Harvoni for Hep C, aortic insuffiencey and ESRD on HD.    Patient has a history of presumed nonischemic cardiomyopathy for a number of years.  Echo in 12/15 showed EF 25-30% and Cardiolite at that time showed no ischemia.  She had Medtronic CRT-D system placed, and EF recovered. In 4/19, echo showed EF 60-65%.     Echo in 10/19 showed EF 55-60%, mild-moderate MR, moderate AI, moderate RV dilation.    She was admitted in 1/20 with diverticulitis and ended up having a sigmoid colectomy.     Echo in 9/20 showed EF lower at 45-50%, moderate AI.    She had LHC/RHC in 8/21 with nonobstructive CAD, normal filling pressures, and preserved cardiac output. Echo 8/21 showed EF 50-55%, mild LVH, mildly decreased RV systolic function, moderate aortic insufficiency.   Admitted 06/23/20 from [facility] with markedly elevated filling pressures. Aggressively diuresed but had worsening renal function and progressed to ESRD. Nephrology consulted. She tolerated HD well and is on MWF schedule.  Echo in 2/22 showed EF 55-60%, low normal RV systolic function.    Saw EP, turned down for Watchman due to comorbidities. Also felt too high risk for ablation.    Echo in 2/23 showed EF 55-60%, moderate LVH, moderate RV enlargement with mildly decreased RV systolic function, moderate TR, ?moderate-severe AI.    Underwent TEE only (3/23) as she spontaneously converted to NSR, showing mild to moderate  MR, mod to severe AI. Unable to get MRI due to device.     Echo in 8/23 showed EF 50-55%, moderate RV dilation, moderate AI.   Admitted 5/27 for acute hypoxic respiratory failure and lactic acidosis. Got SOB after HD and was tachypneic and hypoxic on arrival w/ LA of 7.7.  Also complained of abdominal pain. Placed no NRB and given nebs. CTA was negative for PE. Viral panel + for rhinovirus/Enterovirus. Initial resp failure felt 2/2 rhinovirus pneumonitis. CT of A/P unremarkable for acute findings. Admitted to ICU under CCM. Nephrology managed HD. Lactate ultimately cleared. On 5/31 post HD, pt had PEA arrest requiring 5 min of ACLS + 1 round of EPI prior to ROSC. Minimal responsiveness post arrest and was intubated. Hs trop bumped >24,000. Limited Echo showed drop in EF down to 40-45% (previously 50-55%), global HK, RV mildly reduced, mod-severe AI, mild- mod TR.   Pt has now been extubated and has trach. Vent FiO2 40%. On NE 2 for BP support. On heparin gtt. HD MWF. AHF team asked to assess for HF.    2D Echo 10/07/22  1. Left ventricular ejection fraction, by estimation, is 40 to 45%. Left  ventricular ejection fraction by 2D MOD biplane is 40.3 %. The left  ventricle has mildly decreased function. The left ventricle demonstrates  global hypokinesis.   2. Right ventricular systolic function is mildly reduced. There is  moderately elevated pulmonary artery systolic pressure. The estimated  right ventricular systolic pressure is 48.2 mmHg.   3. Mild mitral valve regurgitation.     4. Tricuspid valve regurgitation is mild to moderate.   5. The aortic valve is tricuspid. Aortic valve regurgitation is moderate  to severe.   6. The inferior vena cava is dilated in size with <50% respiratory  variability, suggesting right atrial pressure of 15 mmHg.    Review of Systems: [y] = yes, [ ] = no   General: Weight gain [ ]; Weight loss [ ]; Anorexia [ ]; Fatigue [ ]; Fever [ ]; Chills [ ]; Weakness [ ]   Cardiac: Chest pain/pressure [ Y]; Resting SOB [Y ]; Exertional SOB [ ]; Orthopnea [ ]; Pedal Edema [ ]; Palpitations [ ]; Syncope [ ]; Presyncope [ ]; Paroxysmal nocturnal dyspnea[ ]  Pulmonary: Cough [ ]; Wheezing[ ]; Hemoptysis[ ]; Sputum [ ]; Snoring [ ]  GI: Vomiting[ ]; Dysphagia[ ]; Melena[ ]; Hematochezia [ ]; Heartburn[ ]; Abdominal pain [ ]; Constipation [ ]; Diarrhea [ ]; BRBPR [ ]  GU: Hematuria[ ]; Dysuria [ ]; Nocturia[ ]  Vascular: Pain in legs with walking [ ]; Pain in feet with lying flat [ ]; Non-healing sores [ ]; Stroke [ ]; TIA [ ]; Slurred speech [ ];  Neuro: Headaches[ ]; Vertigo[ ]; Seizures[ ]; Paresthesias[ ];Blurred vision [ ]; Diplopia [ ]; Vision changes [ ]  Ortho/Skin: Arthritis [ ]; Joint pain [ ]; Muscle pain [ ]; Joint swelling [ ]; Back Pain [ ]; Rash [ ]  Psych: Depression[ ]; Anxiety[ ]  Heme: Bleeding problems [ ]; Clotting disorders [ ]; Anemia [ ]  Endocrine: Diabetes [ ]; Thyroid dysfunction[ ]  Home Medications Prior to Admission medications   Medication Sig Start Date End Date Taking? Authorizing Provider  albuterol (VENTOLIN HFA) 108 (90 Base) MCG/ACT inhaler Inhale 2 puffs into the lungs every 6 (six) hours as needed for wheezing or shortness of breath. 04/07/22  Yes Hunsucker, Matthew R, MD  amiodarone (PACERONE) 200 MG tablet Take 1 tablet by mouth once daily 08/30/22  Yes Saguier, Edward, PA-C  ammonium lactate (LAC-HYDRIN) 12 % lotion Apply daily on forearms. 09/05/22  Yes Wendling, Nicholas Paul, DO  amoxicillin-clavulanate (AUGMENTIN) 875-125 MG tablet Take 1 tablet by mouth 2 (two) times daily. 09/28/22  Yes Beck, Taylor B, NP  apixaban (ELIQUIS) 2.5 MG TABS tablet Take 1 tablet (2.5 mg total) by mouth 2 (two) times daily. 03/11/22  Yes Pokhrel, Laxman, MD  levothyroxine (SYNTHROID) 125 MCG tablet Take 1 tablet (125 mcg total) by mouth daily before breakfast. 01/16/22  Yes Saguier, Edward, PA-C  lidocaine-prilocaine (EMLA) cream Apply 1 Application  topically as needed.   Yes [provider]  methylPREDNISolone (MEDROL) 4 MG tablet Standard 6 day taper 09/28/22  Yes Beck, Taylor B, NP  Vitamin D, Ergocalciferol, (DRISDOL) 1.25 MG (50000 UNIT) CAPS capsule Take 1 capsule (50,000 Units total) by mouth every 7 (seven) days. 09/06/22  Yes Wendling, Nicholas Paul, DO  ipratropium (ATROVENT HFA) 17 MCG/ACT inhaler Inhale 2 puffs into the lungs every 6 (six) hours as needed for wheezing. Patient not taking: Reported on 10/03/2022 03/24/22   Saguier, Edward, PA-C  nitroGLYCERIN (NITROSTAT) 0.4 MG SL tablet Place 1 tablet (0.4 mg total) under the tongue every 5 (five) minutes as needed for chest pain. 10/27/21   Singh, Prashant K, MD  triamcinolone cream (KENALOG) 0.1 % Apply to shins twice dialy for 7 days. 09/05/22   Wendling, Nicholas Paul, DO    Past Medical History: Past Medical History:  Diagnosis Date   Anemia      Angiectasia    Angiodysplasia of small intestine 06/10/2020   Ileum - seen on capsule endoscopy 05/2020 - ablated at colonoscopy   Aortic atherosclerosis (HCC) 08/30/2017   Arthritis    "qwhere" (01/03/2018)   Asthma    reports mild asthma since childhood - had COPD on dx list from prior PCP   Biventricular ICD (implantable cardioverter-defibrillator) in place 01/08/2007   Qualifier: Diagnosis of  By: Martin FNP, Nykedtra     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) 09/24/2020   Chronic obstructive pulmonary disease (HCC)    Chronic systolic congestive heart failure (HCC)    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 (HCV) (HCC)    HEPATITIS C - s/p treatment with Harvoni, saw hepatology, Dawn Drazek   Diabetes (HCC)    Diverticulitis of left colon status post robotic low anterior to sigmoid resection 05/22/2018 07/31/2017   Duodenal arteriovenous malformation    End stage renal disease (HCC) 03/22/2021   Esophageal varices (HCC)    Glaucoma    Gout    History of blood  transfusion ~ 11/2017   Hypertension associated with diabetes (HCC) 06/07/2017   Hypothyroidism    Internal hemorrhoids    LBBB (left bundle branch block)    Malnutrition of moderate degree 07/09/2020   MDD (major depressive disorder), recurrent, in partial remission (HCC) 06/07/2017   OBSTRUCTIVE SLEEP APNEA 11/14/2007   no CPAP   OSTEOPENIA 09/30/2008   Persistent atrial fibrillation (HCC) 02/06/2020   Polyneuropathy associated with underlying disease (HCC) 06/09/2021   Pulmonary hypertension (HCC) on echocardiogram 01/14/2018   Secondary esophageal varices without bleeding (HCC) 12/23/2020   Formatting of this note might be different from the original. Patient had trace esophageal varices during small bowel enteroscopy in October 2021, follow-up EGD in January 2022 with no varices.  She previously had GI bleeding thought to be from AVM versus GAVE.  She is no longer on carvedilol and will need surveillance endoscopy.  Patient was previously followed at [facility] and I have asked her    Secondary hypercoagulable state (HCC) 02/06/2020   Type 2 diabetes, controlled, with renal manifestation (HCC) 11/24/2013    Past Surgical History: Past Surgical History:  Procedure Laterality Date   A/V FISTULAGRAM N/A 03/28/2021   Procedure: A/V FISTULAGRAM;  Surgeon: Cain, Brandon Christopher, MD;  [facility];  Service: Cardiovascular;  Laterality: N/A;   APPENDECTOMY     AV FISTULA PLACEMENT Left 08/05/2020   Procedure: LEFT UPPER EXTREMITY ARTERIOVENOUS (AV) FISTULA CREATION;  Surgeon: Hawken, Thomas N, MD;  [facility]: [facility];  Service: Vascular;  Laterality: Left;   BALLOON ENTEROSCOPY N/A 06/21/2022   Procedure: BALLOON ENTEROSCOPY;  Surgeon: Cirigliano, Vito V, DO;  Location: [facility];  Service: Gastroenterology;  Laterality: N/A;   BASCILIC VEIN TRANSPOSITION Left 09/16/2020   Procedure: LEFT SECOND STAGE BASCILIC VEIN TRANSPOSITION;  Surgeon: Hawken, Thomas N, MD;   [facility]: [facility];  Service: Vascular;  Laterality: Left;  PERIPHERAL NERVE BLOCK   CARDIAC CATHETERIZATION     CARDIOVERSION N/A 12/14/2014   Procedure: CARDIOVERSION;  Surgeon: Katarina H Nelson, MD;  [facility];  Service: Cardiovascular;  Laterality: N/A;   CARDIOVERSION N/A 02/26/2017   Procedure: CARDIOVERSION;  Surgeon: Taylor, Gregg W, MD;  [facility];  Service: Cardiovascular;  Laterality: N/A;   CARDIOVERSION N/A 07/24/2017   Procedure: CARDIOVERSION;  Surgeon: Nahser, Philip J, MD;  [facility];  Service: Cardiovascular;  Laterality: N/A;   CARDIOVERSION N/A 02/12/2020     Procedure: CARDIOVERSION;  Surgeon: Lycan Davee S, MD;  [facility];  Service: Cardiovascular;  Laterality: N/A;   CARDIOVERSION N/A 09/15/2021   Procedure: CARDIOVERSION;  Surgeon: Shanty Ginty S, MD;  [facility];  Service: Cardiovascular;  Laterality: N/A;   CARPAL TUNNEL RELEASE  07/21/2021   COLONOSCOPY N/A 11/08/2017   Procedure: COLONOSCOPY;  Surgeon: Jacobs, Daniel P, MD;  [facility];  Service: Endoscopy;  Laterality: N/A;   COLONOSCOPY WITH PROPOFOL N/A 05/31/2020   Procedure: COLONOSCOPY WITH PROPOFOL;  Surgeon: Perry, John N, MD;  [facility];  Service: Endoscopy;  Laterality: N/A;   COLONOSCOPY WITH PROPOFOL N/A 10/26/2021   Procedure: COLONOSCOPY WITH PROPOFOL;  Surgeon: Dorsey, Ying C, MD;  [facility];  Service: Gastroenterology;  Laterality: N/A;   DILATION AND CURETTAGE OF UTERUS     ENTEROSCOPY N/A 03/02/2020   Procedure: ENTEROSCOPY;  Surgeon: Armbruster, Steven P, MD;  [facility];  Service: Gastroenterology;  Laterality: N/A;   ENTEROSCOPY N/A 06/08/2022   Procedure: ENTEROSCOPY;  Surgeon: Pyrtle, Jay M, MD;  [facility];  Service: Gastroenterology;  Laterality: N/A;   EP IMPLANTABLE DEVICE N/A 10/06/2015   Procedure: BIV ICD Generator Changeout;  Surgeon: Steven C Klein, MD;   [facility];  Service: Cardiovascular;  Laterality: N/A;   ESOPHAGOGASTRODUODENOSCOPY N/A 10/26/2017   Procedure: ESOPHAGOGASTRODUODENOSCOPY (EGD);  Surgeon: Stark, Malcolm T, MD;  [facility];  Service: Endoscopy;  Laterality: N/A;   ESOPHAGOGASTRODUODENOSCOPY (EGD) WITH PROPOFOL N/A 11/07/2017   Procedure: ESOPHAGOGASTRODUODENOSCOPY (EGD) WITH PROPOFOL;  Surgeon: Jacobs, Daniel P, MD;  [facility];  Service: Endoscopy;  Laterality: N/A;   ESOPHAGOGASTRODUODENOSCOPY (EGD) WITH PROPOFOL N/A 05/29/2020   Procedure: ESOPHAGOGASTRODUODENOSCOPY (EGD) WITH PROPOFOL;  Surgeon: Danis, Henry L III, MD;  [facility];  Service: Gastroenterology;  Laterality: N/A;   ESOPHAGOGASTRODUODENOSCOPY (EGD) WITH PROPOFOL N/A 10/26/2021   Procedure: ESOPHAGOGASTRODUODENOSCOPY (EGD) WITH PROPOFOL;  Surgeon: Dorsey, Ying C, MD;  [facility];  Service: Gastroenterology;  Laterality: N/A;   ESOPHAGOGASTRODUODENOSCOPY (EGD) WITH PROPOFOL N/A 03/08/2022   Procedure: ESOPHAGOGASTRODUODENOSCOPY (EGD) WITH PROPOFOL;  Surgeon: Gupta, Rajesh, MD;  [facility];  Service: Gastroenterology;  Laterality: N/A;   GIVENS CAPSULE STUDY N/A 05/19/2020   Procedure: GIVENS CAPSULE STUDY;  Surgeon: Gessner, Carl E, MD;  [facility];  Service: Endoscopy;  Laterality: N/A;  .adm for obs since pacemaker, PA wil enter order and see pt   GIVENS CAPSULE STUDY N/A 05/29/2020   Procedure: GIVENS CAPSULE STUDY;  Surgeon: Danis, Henry L III, MD;  [facility];  Service: Gastroenterology;  Laterality: N/A;   GIVENS CAPSULE STUDY N/A 06/08/2022   Procedure: GIVENS CAPSULE STUDY;  Surgeon: Pyrtle, Jay M, MD;  [facility];  Service: Gastroenterology;  Laterality: N/A;   HEMATOMA EVACUATION Left 09/24/2020   Procedure: EVACUATION HEMATOMA ARM;  Surgeon: Early, Todd F, MD;  [facility];  Service: Vascular;  Laterality: Left;   HEMOSTASIS CLIP PLACEMENT   06/21/2022   Procedure: HEMOSTASIS CLIP PLACEMENT;  Surgeon: Cirigliano, Vito V, DO;  [facility];  Service: Gastroenterology;;   HOT HEMOSTASIS N/A 10/26/2017   Procedure: HOT HEMOSTASIS (ARGON PLASMA COAGULATION/BICAP);  Surgeon: Stark, Malcolm T, MD;  [facility];  Service: Endoscopy;  Laterality: N/A;   HOT HEMOSTASIS N/A 03/02/2020   Procedure: HOT HEMOSTASIS (ARGON PLASMA COAGULATION/BICAP);  Surgeon: Armbruster, Steven P, MD;  [facility];  Service: Gastroenterology;  Laterality: N/A;   HOT HEMOSTASIS N/A 05/31/2020   Procedure:   HOT HEMOSTASIS (ARGON PLASMA COAGULATION/BICAP);  Surgeon: Perry, John N, MD;  [facility];  Service: Endoscopy;  Laterality: N/A;   HOT HEMOSTASIS N/A 03/08/2022   Procedure: HOT HEMOSTASIS (ARGON PLASMA COAGULATION/BICAP);  Surgeon: Gupta, Rajesh, MD;  [facility];  Service: Gastroenterology;  Laterality: N/A;   HOT HEMOSTASIS N/A 06/08/2022   Procedure: HOT HEMOSTASIS (ARGON PLASMA COAGULATION/BICAP);  Surgeon: Pyrtle, Jay M, MD;  [facility];  Service: Gastroenterology;  Laterality: N/A;   HOT HEMOSTASIS N/A 06/21/2022   Procedure: HOT HEMOSTASIS (ARGON PLASMA COAGULATION/BICAP);  Surgeon: Cirigliano, Vito V, DO;  [facility];  Service: Gastroenterology;  Laterality: N/A;   ICD GENERATOR CHANGEOUT N/A 07/21/2022   Procedure: ICD GENERATOR CHANGEOUT;  Surgeon: Klein, Steven C, MD;  [facility];  Service: Cardiovascular;  Laterality: N/A;   INSERT / REPLACE / REMOVE PACEMAKER  ?2008   IR PERC TUN PERIT CATH WO PORT S&I /IMAG  07/05/2020   PERIPHERAL VASCULAR INTERVENTION Left 03/28/2021   Procedure: PERIPHERAL VASCULAR INTERVENTION;  Surgeon: Cain, Brandon Christopher, MD;  [facility];  Service: Cardiovascular;  Laterality: Left;   POLYPECTOMY  11/08/2017   Procedure: POLYPECTOMY;  Surgeon: Jacobs, Daniel P, MD;  [facility];  Service: Endoscopy;;    PROCTOSCOPY N/A 05/22/2018   Procedure: RIGID PROCTOSCOPY;  Surgeon: Gross, Steven, MD;  [facility]: [facility];  Service: General;  Laterality: N/A;   RIGHT HEART CATH N/A 02/13/2018   Procedure: RIGHT HEART CATH;  Surgeon: Matisha Termine S, MD;  [facility];  Service: Cardiovascular;  Laterality: N/A;   RIGHT HEART CATH N/A 06/23/2020   Procedure: RIGHT HEART CATH;  Surgeon: Jvion Turgeon S, MD;  [facility];  Service: Cardiovascular;  Laterality: N/A;   RIGHT/LEFT HEART CATH AND CORONARY ANGIOGRAPHY N/A 12/11/2019   Procedure: RIGHT/LEFT HEART CATH AND CORONARY ANGIOGRAPHY;  Surgeon: Ariabella Brien S, MD;  [facility];  Service: Cardiovascular;  Laterality: N/A;   SUBMUCOSAL TATTOO INJECTION  03/02/2020   Procedure: SUBMUCOSAL TATTOO INJECTION;  Surgeon: Armbruster, Steven P, MD;  [facility];  Service: Gastroenterology;;   SUBMUCOSAL TATTOO INJECTION  05/31/2020   Procedure: SUBMUCOSAL TATTOO INJECTION;  Surgeon: Perry, John N, MD;  [facility];  Service: Endoscopy;;   SUBMUCOSAL TATTOO INJECTION  06/21/2022   Procedure: SUBMUCOSAL TATTOO INJECTION;  Surgeon: Cirigliano, Vito V, DO;  [facility];  Service: Gastroenterology;;   TEE WITHOUT CARDIOVERSION N/A 08/02/2021   Procedure: TRANSESOPHAGEAL ECHOCARDIOGRAM (TEE);  Surgeon: Azelia Reiger S, MD;  [facility];  Service: Cardiovascular;  Laterality: N/A;   TUBAL LIGATION     XI ROBOTIC ASSISTED LOWER ANTERIOR RESECTION N/A 05/22/2018   Procedure: XI ROBOTIC ASSISTED SIGMOID COLOECTOMY MOBILIZATION OF SPLENIC FLEXURE, FIREFLY ASSESSMENT OF PERFUSION;  Surgeon: Gross, Steven, MD;  Location: [facility];  Service: General;  Laterality: N/A;  ERAS PATHWAY    Family History: Family History  Problem Relation Age of Onset   Asthma Father    Heart attack Father    Asthma Sister    Lung cancer Sister    Heart attack Mother    Stroke Brother    Colon  cancer Neg Hx    Esophageal cancer Neg Hx    Pancreatic cancer Neg Hx    Stomach cancer Neg Hx    Liver disease Neg Hx     Social History: Social History   Socioeconomic History   Marital status: Divorced      Spouse name: Not on file   Number of children: 1   Years of education: 12   Highest education level: 12th grade  Occupational History   Not on file  Tobacco Use   Smoking status: Never   Smokeless tobacco: Never  Vaping Use   Vaping Use: Never used  Substance and Sexual Activity   Alcohol use: Not Currently    Comment: 01/03/2018 "couple drinks/month"   Drug use: Not Currently    Types: Marijuana    Comment: VERY INTERMITTENT    Sexual activity: Not Currently  Other Topics Concern   Not on file  Social History Narrative   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      Home Situation: lives alone      Spiritual Beliefs: Christian - [employer]      Lifestyle: no regular exercise; poor diet      She is divorced at least one adult child   Never smoker    rare alcohol to occasional at best    no substance abuse         Social Determinants of Health   Financial Resource Strain: Low Risk  (09/04/2022)   Overall Financial Resource Strain (CARDIA)    Difficulty of Paying Living Expenses: Not hard at all  Food Insecurity: No Food Insecurity (09/04/2022)   Hunger Vital Sign    Worried About Running Out of Food in the Last Year: Never true    Ran Out of Food in the Last Year: Never true  Transportation Needs: No Transportation Needs (09/04/2022)   PRAPARE - Transportation    Lack of Transportation (Medical): No    Lack of Transportation (Non-Medical): No  Physical Activity: Insufficiently Active (09/04/2022)   Exercise Vital Sign    Days of Exercise per Week: 1 day    Minutes of Exercise per Session: 20 min  Stress: Stress Concern Present (09/04/2022)   [employer] - Occupational Stress Questionnaire    Feeling of Stress : To some extent   Social Connections: Moderately Isolated (09/04/2022)   Social Connection and Isolation Panel [NHANES]    Frequency of Communication with Friends and Family: More than three times a week    Frequency of Social Gatherings with Friends and Family: Three times a week    Attends Religious Services: More than 4 times per year    Active Member of Clubs or Organizations: No    Attends Club or Organization Meetings: Not on file    Marital Status: Divorced    Allergies:  No Known Allergies  Objective:    Vital Signs:   Temp:  [96.6 F (35.9 C)-100 F (37.8 C)] 97.9 F (36.6 C) (06/05 1101) Pulse Rate:  [68-132] 75 (06/05 1058) Resp:  [4-34] 22 (06/05 1058) BP: (90-188)/(34-67) 101/42 (06/05 1058) SpO2:  [97 %-100 %] 100 % (06/05 1058) FiO2 (%):  [40 %-100 %] 40 % (06/05 1007) Weight:  [60.4 kg-61.6 kg] 60.4 kg (06/05 1101) Last BM Date : 10/10/22  Weight change: Filed Weights   10/11/22 0112 10/11/22 0705 10/11/22 1101  Weight: 61.6 kg 61.6 kg 60.4 kg    Intake/Output:   Intake/Output Summary (Last 24 hours) at 10/11/2022 1107 Last data filed at 10/11/2022 1101 Gross per 24 hour  Intake 2044.16 ml  Output 1000 ml  Net 1044.16 ml      Physical Exam    General:  fatigued and chronically appearing. No resp difficulty HEENT: normal + trach collar  Neck: supple. JVP 12   cm . Carotids 2+ bilat; no bruits. No lymphadenopathy or thyromegaly appreciated. Cor: PMI nondisplaced. Regular rate & rhythm. 2/6 diastolic murmur. ?rub  Lungs: on vent via trach, + diffuse expiratory wheezing  Abdomen: soft, nontender, nondistended. No hepatosplenomegaly. No bruits or masses. Good bowel sounds. Extremities: no cyanosis, clubbing, rash, edema Neuro: alert & orientedx3, cranial nerves grossly intact. moves all 4 extremities w/o difficulty. Affect pleasant   Telemetry   V paced 70s   EKG    No new EKG to review   Labs   Basic Metabolic Panel: Recent Labs  Lab 10/06/22 1735  10/07/22 0430 10/07/22 0710 10/07/22 1304 10/07/22 1755 10/08/22 0553 10/08/22 1644 10/09/22 0034 10/10/22 0422 10/10/22 0509 10/11/22 0236  NA 139   < > 138  --   --  140  --  142 135 136 132*  K 4.0   < > 5.2*  --   --  3.9  --  3.9 3.4* 3.2* 4.7  CL 90*  --  92*  --   --  97*  --  100 95*  --  91*  CO2 19*  --  19*  --   --  23  --  22 22  --  21*  GLUCOSE 270*  --  167*  --   --  187*  --  162* 215*  --  102*  BUN 27*  --  37*  --   --  64*  --  80* 43*  --  60*  CREATININE 3.69*  --  4.65*  --   --  6.52*  --  7.50* 4.55*  --  5.88*  CALCIUM 7.4*  --  8.7*  --   --  8.1*  --  7.6* 8.1*  --  8.8*  MG 2.2  --   --  2.3 2.2 2.3 2.4  --   --   --   --   PHOS 7.9*  --   --  6.9* 5.9* 6.1* 5.4*  --   --   --   --    < > = values in this interval not displayed.    Liver Function Tests: Recent Labs  Lab 10/07/22 0710 10/08/22 0553 10/09/22 0034 10/10/22 0422 10/11/22 0236  AST 366* 262* 220* 205* 207*  ALT 540* 370* 303* 249* 229*  ALKPHOS 138* 104 92 107 123  BILITOT 4.7* 6.2* 7.1* 9.6* 11.2*  PROT 8.5* 7.2 7.1 6.8 6.9  ALBUMIN 3.5 2.9* 2.6* 2.4* 2.2*   No results for input(s): "LIPASE", "AMYLASE" in the last 168 hours. Recent Labs  Lab 10/06/22 0229  AMMONIA 25    CBC: Recent Labs  Lab 10/05/22 0315 10/06/22 0229 10/06/22 1735 10/07/22 0122 10/08/22 1817 10/09/22 0034 10/09/22 1833 10/10/22 0422 10/10/22 0509 10/11/22 0236  WBC 8.7   < > 12.1*   < > 14.3* 10.4 13.3* 14.9*  --  15.8*  NEUTROABS 8.4*  --  11.5*  --   --   --   --   --   --   --   HGB 12.5   < > 11.4*   < > 11.0* 10.3* 10.2* 10.5* 12.6 10.6*  HCT 38.8   < > 36.3   < > 34.0* 33.1* 31.6* 31.5* 37.0 32.6*  MCV 88.2   < > 91.2   < > 89.5 91.4 89.0 87.5  --  89.3  PLT 123*   < > 94*   < > 60* 57* 56* 67*  --    81*   < > = values in this interval not displayed.    Cardiac Enzymes: No results for input(s): "CKTOTAL", "CKMB", "CKMBINDEX", "TROPONINI" in the last 168 hours.  BNP: BNP (last 3  results) Recent Labs    03/27/22 1650 09/28/22 1834 09/30/22 1515  BNP 1,685.0* 1,552.2* 1,538.3*    ProBNP (last 3 results) Recent Labs    11/10/21 1151 11/17/21 1033 03/20/22 1159  PROBNP 1,656.0* 2,020.0* 948.0*     CBG: Recent Labs  Lab 10/10/22 1610 10/10/22 1938 10/10/22 2325 10/11/22 0328 10/11/22 0809  GLUCAP 147* 146* 143* 84 105*    Coagulation Studies: No results for input(s): "LABPROT", "INR" in the last 72 hours.   Imaging   DG Abd Portable 1V  Result Date: 10/11/2022 CLINICAL DATA:  Feeding tube placement. EXAM: PORTABLE ABDOMEN - 1 VIEW COMPARISON:  10/07/2022 FINDINGS: Feeding tube tip is in the distal stomach with the tip directed towards the pylorus. Visualized abdomen demonstrates nonspecific gas pattern. IMPRESSION: Feeding tube tip is in the distal stomach directed towards the pylorus. Electronically Signed   By: Eric  Mansell M.D.   On: 10/11/2022 10:43   DG Chest Port 1 View  Result Date: 10/10/2022 CLINICAL DATA:  Status post tracheostomy EXAM: PORTABLE CHEST 1 VIEW COMPARISON:  Chest x-ray 10/09/2022 FINDINGS: There is a new tracheostomy with distal tip 3.7 cm above the carina. Left-sided ICD and right-sided central venous catheter are unchanged in position. The heart is enlarged. Bilateral mid and lower lung airspace opacities persist. There is no pleural effusion or pneumothorax. The osseous structures are stable. IMPRESSION: 1. New tracheostomy with distal tip 3.7 cm above the carina. 2. Persistent bilateral mid and lower lung airspace opacities. Electronically Signed   By: [name]  Guttmann M.D.   On: 10/10/2022 16:04     Medications:     Current Medications:  Chlorhexidine Gluconate Cloth  6 each Topical Q0600   Chlorhexidine Gluconate Cloth  6 each Topical Q0600   docusate  100 mg Per Tube BID   doxercalciferol  4 mcg Intravenous Q M,W,F-HD   feeding supplement (PROSource TF20)  60 mL Per Tube Daily   insulin aspart  0-15 Units  Subcutaneous Q4H   insulin glargine-yfgn  5 Units Subcutaneous Daily   ipratropium-albuterol  3 mL Nebulization Q6H   levothyroxine  125 mcg Per Tube Q0600   lidocaine  1 patch Transdermal Q24H   multivitamin  1 tablet Per Tube QHS   mouth rinse  15 mL Mouth Rinse Q2H   pantoprazole (PROTONIX) IV  40 mg Intravenous Q12H   polyethylene glycol  17 g Per Tube Daily   thiamine  100 mg Per Tube Daily    Infusions:  sodium chloride Stopped (10/07/22 1027)   sodium chloride     cefTAZidime (FORTAZ)  IV Stopped (10/10/22 1746)   feeding supplement (VITAL 1.5 CAL) 40 mL/hr at 10/11/22 0855   heparin 400 Units/hr (10/11/22 0855)   norepinephrine (LEVOPHED) Adult infusion 2 mcg/min (10/11/22 0855)      Patient Profile   77 y.o. with history of nonischemic cardiomyopathy with recovery of EF with BiV pacing, recurrent GI bleeding due to gastric AVMs, paroxysmal atrial flutter, cirrhosis s/p treatment with Harvoni for Hep C, aortic insuffiencey and ESRD on HD, admitted w/ acute hypoxic respiratory failure and lactic acidosis in the setting of rhinovirus pneumonitis. Viral panel + for rhinovirus/Enterovirus. Hospital course c/b PEA arrest and NSTEMI. Echo w/ drop in EF. Remains vent dependent, s/p trach. AHF team consulted for   HF management.   Assessment/Plan   1. Acute Hypoxic Respiratory Failure - initially required NRB. Chest CT - for PE. Viral panel + for rhinovirus/Enterovirus - Initial resp failure felt 2/2 rhinovirus pneumonitis - Intubated on 5/31 post PEA arrest  - Extubated w/ trach placement on 6/4, remains on vent support FiO2 40%, PEEP 5  - per PCCM   2. PEA Arrest - 5/31, ~5 min ACLS + Epi before ROSC  3. NSTEMI/ Known CAD  - LHC 2021 40% pLAD, 50% ost D1 - Hs trop peaked > 24,000, trending down 4,805  - Echo w/ drop in EF  - on heparin gtt  - plan LHC tomorrow   4. HFimEF>>HFrEF  - H/o NICM.  EF improved to 60-65% with MDT BiV pacing  - Echo 8/23 EF 50-55%, with  moderate RV dilation, moderate AI  - Echo this admit EF 40-45%, RV mildly reduced (post PEA arrest)  - HS Trop > 24000. Plan R/LHC tomorrow - volume managed by iHD, MWF. Plan session today   5. Rhinovirus/Enterovirus - supportive care, per CCM   6. Hypoxic Respiratory Failure - remains vent dependent, s/p trach 6/4  - per CCM   7. ESRD - on HD MWF - nephrology following   8. Aortic Insuffiencey  - TEE 3/23 showed moderate to severe AI (PHT 413 msec and vena contracta 0.49 cm).  - Repeat echo 8/23 showed moderate AI.  - Echo this admit shows Mod-Severe AI  - ? If TAVR candidate     Length of Stay: 9  Brittainy Simmons, PA-C  10/11/2022, 11:07 AM  Advanced Heart Failure Team Pager [phone] (M-F; 7a - 5p)  Please contact [facility] for night-coverage after hours (4p -7a ) and weekends on [email or web address]  Patient seen with PA, agree with the above note.    Patient was admitted with acute hypoxemic respiratory failure from rhinovirus pneumonitis, also appears to have developed a secondary bacterial PNA with Pseudomonas. Initially on NRBM but on 5/31 had PEA arrest thought to be due to hypercarbic respiratory failure. She has remained on vent and now has tracheostomy.  Troponin rose to >24000 after PEA arrest.  She was not initially cathed due to instability.  She is currently off  pressors and on heparin gtt and ceftazidime for Pseudomonas.    I reviewed her echo, EF appears to be around 45% with moderate LVH, moderate RV dysfunction with moderate RV enlargement, D-shaped septum, severe TR, mod-severe AI.  Of note, has wide pulse pressure.   General: Awake Neck: Tracheostomy, unable to see JVD, no thyromegaly or thyroid nodule.  Lungs: Bilateral rhonchi CV: Nondisplaced PMI.  Heart regular, lung noises prominent and unable to hear a murmur.  No peripheral edema.  No carotid bruit.  Normal pedal pulses.  Abdomen: Soft, nontender, no hepatosplenomegaly, no distention.  Skin:  Intact without lesions or rashes.  Neurologic: Follows commands.  Extremities: No clubbing or cyanosis.  HEENT: Normal.   1. Acute hypoxemic respiratory failure: CXR with bilateral airspace disease.  Suspect rhinovirus pneumonitis with superimposed secondary bacterial PNA with Pseudomonas.  Now with tracheostomy.   - Continue ceftazidime.  - Per CCM.  2. CAD: Prior cath in 2021 with nonbstructive disease. Patient had PEA arrest 5/31 thought to be due to hypercarbic respiratory failure.  HS-TnI post-arrest was >24000 suggesting NSTEMI. Echo showed mild LV dysfunction with EF 45%, mildly lower than prior (50-55% in 8/23). No chest pain currently.  - Continue heparin gtt.  - No statin with elevated   LFTs.  Repeat in am and start statin if trending down.  - ASA 81 daily.  - Coronary angiography tomorrow..  Discussed risks/benefits with patient/daughter and they agree to procedure.  3. Acute on chronic HF with mid-range EF: History of NICM with recovery of EF after MDT CRT-D device was placed.  Echo in 8/23 with EF 50-55%.  Echo this admission showed EF 45% with moderate LVH, moderate RV dysfunction with moderate RV enlargement, D-shaped septum, severe TR, mod-severe AI.  IVC dilated on echo, suspect some volume overload.  - Off pressors.  - Volume management via HD.  - Will plan RHC/LHC to assess filling pressures and for significant coronary disease as cause of troponin elevation. Discussed risks/benefits with patient/daughter and they agree to procedure.  - She had a somewhat equivocal PYP scan in the past, consider repeat in future to investigate for TTR cardiac amyloidosis.  Not candidate for cardiac MRI due to her device (not compatible with MRI).  4. Atrial fibrillation: Paroxysmal.  In NSR.  - Heparin gtt (Eliquis on hold).  - Amiodarone on hold with elevated LFTs, can likely restart if trending down (check in am).  5. Aortic insufficiency: Moderate-severe by recent echoes.  Has wide pulse  pressure.   - Consider TAVR in future.  6. PEA arrest: 5/31, due to respiratory failure.  Treated with 5 minutes CPR and epinephrine.  Had rib fractures.  7. ESRD: MWF HD.   Elle Vezina 10/11/2022 3:44 PM       

## 2022-10-11 NOTE — Procedures (Signed)
Cortrak  Person Inserting Tube:  Aliyah Abeyta D, RD Tube Type:  Cortrak - 43 inches Tube Size:  10 Tube Location:  Left nare Secured by: Bridle Technique Used to Measure Tube Placement:  Marking at nare/corner of mouth Cortrak Secured At:  65 cm Procedure Comments:  Cortrak Tube Team Note:  Consult received to place a Cortrak feeding tube.   X-ray is required, abdominal x-ray has been ordered by the Cortrak team. Please confirm tube placement before using the Cortrak tube.   If the tube becomes dislodged please keep the tube and contact the Cortrak team at [email or web address] for replacement.  If after hours and replacement cannot be delayed, place a NG tube and confirm placement with an abdominal x-ray.    Kaytlynn Kochan, RD, LDN Clinical Dietitian RD pager # available in AMION  After hours/weekend pager # available in AMION    

## 2022-10-11 NOTE — Consult Note (Addendum)
Advanced Heart Failure Team Consult Note   Primary Physician: Esperanza Richters, PA-C PCP-Cardiologist:  Marca Ancona, MD  Reason for Consultation: acute systolic heart failure, post PEA arrest  HPI:    Morgan Clay is seen today for evaluation of acute systolic heart failure, post PEA arrest, at the request of Dr. Elease Hashimoto, Cardiology.   77 y.o. with history of nonischemic cardiomyopathy with recovery of EF with BiV pacing, recurrent GI bleeding due to gastric AVMs, paroxysmal atrial flutter, cirrhosis s/p treatment with Harvoni for Hep C, aortic insuffiencey and ESRD on HD.    Patient has a history of presumed nonischemic cardiomyopathy for a number of years.  Echo in 12/15 showed EF 25-30% and Cardiolite at that time showed no ischemia.  She had Medtronic CRT-D system placed, and EF recovered. In 4/19, echo showed EF 60-65%.     Echo in 10/19 showed EF 55-60%, mild-moderate MR, moderate AI, moderate RV dilation.    She was admitted in 1/20 with diverticulitis and ended up having a sigmoid colectomy.     Echo in 9/20 showed EF lower at 45-50%, moderate AI.    She had LHC/RHC in 8/21 with nonobstructive CAD, normal filling pressures, and preserved cardiac output. Echo 8/21 showed EF 50-55%, mild LVH, mildly decreased RV systolic function, moderate aortic insufficiency.   Admitted 06/23/20 from [facility] with markedly elevated filling pressures. Aggressively diuresed but had worsening renal function and progressed to ESRD. Nephrology consulted. She tolerated HD well and is on MWF schedule.  Echo in 2/22 showed EF 55-60%, low normal RV systolic function.    Saw EP, turned down for Watchman due to comorbidities. Also felt too high risk for ablation.    Echo in 2/23 showed EF 55-60%, moderate LVH, moderate RV enlargement with mildly decreased RV systolic function, moderate TR, ?moderate-severe AI.    Underwent TEE only (3/23) as she spontaneously converted to NSR, showing mild to moderate  MR, mod to severe AI. Unable to get MRI due to device.     Echo in 8/23 showed EF 50-55%, moderate RV dilation, moderate AI.   Admitted 5/27 for acute hypoxic respiratory failure and lactic acidosis. Got SOB after HD and was tachypneic and hypoxic on arrival w/ LA of 7.7.  Also complained of abdominal pain. Placed no NRB and given nebs. CTA was negative for PE. Viral panel + for rhinovirus/Enterovirus. Initial resp failure felt 2/2 rhinovirus pneumonitis. CT of A/P unremarkable for acute findings. Admitted to ICU under CCM. Nephrology managed HD. Lactate ultimately cleared. On 5/31 post HD, pt had PEA arrest requiring 5 min of ACLS + 1 round of EPI prior to ROSC. Minimal responsiveness post arrest and was intubated. Hs trop bumped >24,000. Limited Echo showed drop in EF down to 40-45% (previously 50-55%), global HK, RV mildly reduced, mod-severe AI, mild- mod TR.   Pt has now been extubated and has trach. Vent FiO2 40%. On NE 2 for BP support. On heparin gtt. HD MWF. AHF team asked to assess for HF.    2D Echo 10/07/22  1. Left ventricular ejection fraction, by estimation, is 40 to 45%. Left  ventricular ejection fraction by 2D MOD biplane is 40.3 %. The left  ventricle has mildly decreased function. The left ventricle demonstrates  global hypokinesis.   2. Right ventricular systolic function is mildly reduced. There is  moderately elevated pulmonary artery systolic pressure. The estimated  right ventricular systolic pressure is 48.2 mmHg.   3. Mild mitral valve regurgitation.  4. Tricuspid valve regurgitation is mild to moderate.   5. The aortic valve is tricuspid. Aortic valve regurgitation is moderate  to severe.   6. The inferior vena cava is dilated in size with <50% respiratory  variability, suggesting right atrial pressure of 15 mmHg.    Review of Systems: [y] = yes, [ ]  = no   General: Weight gain [ ] ; Weight loss [ ] ; Anorexia [ ] ; Fatigue [ ] ; Fever [ ] ; Chills [ ] ; Weakness [ ]    Cardiac: Chest pain/pressure [ Y]; Resting SOB [Y ]; Exertional SOB [ ] ; Orthopnea [ ] ; Pedal Edema [ ] ; Palpitations [ ] ; Syncope [ ] ; Presyncope [ ] ; Paroxysmal nocturnal dyspnea[ ]   Pulmonary: Cough [ ] ; Wheezing[ ] ; Hemoptysis[ ] ; Sputum [ ] ; Snoring [ ]   GI: Vomiting[ ] ; Dysphagia[ ] ; Melena[ ] ; Hematochezia [ ] ; Heartburn[ ] ; Abdominal pain [ ] ; Constipation [ ] ; Diarrhea [ ] ; BRBPR [ ]   GU: Hematuria[ ] ; Dysuria [ ] ; Nocturia[ ]   Vascular: Pain in legs with walking [ ] ; Pain in feet with lying flat [ ] ; Non-healing sores [ ] ; Stroke [ ] ; TIA [ ] ; Slurred speech [ ] ;  Neuro: Headaches[ ] ; Vertigo[ ] ; Seizures[ ] ; Paresthesias[ ] ;Blurred vision [ ] ; Diplopia [ ] ; Vision changes [ ]   Ortho/Skin: Arthritis [ ] ; Joint pain [ ] ; Muscle pain [ ] ; Joint swelling [ ] ; Back Pain [ ] ; Rash [ ]   Psych: Depression[ ] ; Anxiety[ ]   Heme: Bleeding problems [ ] ; Clotting disorders [ ] ; Anemia [ ]   Endocrine: Diabetes [ ] ; Thyroid dysfunction[ ]   Home Medications Prior to Admission medications   Medication Sig Start Date End Date Taking? Authorizing Provider  albuterol (VENTOLIN HFA) 108 (90 Base) MCG/ACT inhaler Inhale 2 puffs into the lungs every 6 (six) hours as needed for wheezing or shortness of breath. 04/07/22  Yes Hunsucker, Lesia Sago, MD  amiodarone (PACERONE) 200 MG tablet Take 1 tablet by mouth once daily 08/30/22  Yes Saguier, Ramon Dredge, PA-C  ammonium lactate (LAC-HYDRIN) 12 % lotion Apply daily on forearms. 09/05/22  Yes Sharlene Dory, DO  amoxicillin-clavulanate (AUGMENTIN) 875-125 MG tablet Take 1 tablet by mouth 2 (two) times daily. 09/28/22  Yes Clayborne [name], NP  apixaban (ELIQUIS) 2.5 MG TABS tablet Take 1 tablet (2.5 mg total) by mouth 2 (two) times daily. 03/11/22  Yes Pokhrel, Rebekah Chesterfield, MD  levothyroxine (SYNTHROID) 125 MCG tablet Take 1 tablet (125 mcg total) by mouth daily before breakfast. 01/16/22  Yes Saguier, Ramon Dredge, PA-C  lidocaine-prilocaine (EMLA) cream Apply 1 Application  topically as needed.   Yes [provider]  methylPREDNISolone (MEDROL) 4 MG tablet Standard 6 day taper 09/28/22  Yes Clayborne [name], NP  Vitamin D, Ergocalciferol, (DRISDOL) 1.25 MG (50000 UNIT) CAPS capsule Take 1 capsule (50,000 Units total) by mouth every 7 (seven) days. 09/06/22  Yes Sharlene Dory, DO  ipratropium (ATROVENT HFA) 17 MCG/ACT inhaler Inhale 2 puffs into the lungs every 6 (six) hours as needed for wheezing. Patient not taking: Reported on 10/03/2022 03/24/22   Saguier, Ramon Dredge, PA-C  nitroGLYCERIN (NITROSTAT) 0.4 MG SL tablet Place 1 tablet (0.4 mg total) under the tongue every 5 (five) minutes as needed for chest pain. 10/27/21   Leroy Sea, MD  triamcinolone cream (KENALOG) 0.1 % Apply to shins twice dialy for 7 days. 09/05/22   Sharlene Dory, DO    Past Medical History: Past Medical History:  Diagnosis Date   Anemia  Angiectasia    Angiodysplasia of small intestine 06/10/2020   Ileum - seen on capsule endoscopy 05/2020 - ablated at colonoscopy   Aortic atherosclerosis (HCC) 08/30/2017   Arthritis    "qwhere" (01/03/2018)   Asthma    reports mild asthma since childhood - had COPD on dx list from prior PCP   Biventricular ICD (implantable cardioverter-defibrillator) in place 01/08/2007   Qualifier: Diagnosis of  By: Daphine Deutscher FNP, [place]     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) 09/24/2020   Chronic obstructive pulmonary disease (HCC)    Chronic systolic congestive heart failure (HCC)    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 (HCV) (HCC)    HEPATITIS C - s/p treatment with Harvoni, saw hepatology, Dawn Drazek   Diabetes [facility])    Diverticulitis of left colon status post robotic low anterior to sigmoid resection 05/22/2018 07/31/2017   Duodenal arteriovenous malformation    End stage renal disease (HCC) 03/22/2021   Esophageal varices (HCC)    Glaucoma    Gout    History of blood  transfusion ~ 11/2017   Hypertension associated with diabetes (HCC) 06/07/2017   Hypothyroidism    Internal hemorrhoids    LBBB (left bundle branch block)    Malnutrition of moderate degree 07/09/2020   MDD (major depressive disorder), recurrent, in partial remission (HCC) 06/07/2017   OBSTRUCTIVE SLEEP APNEA 11/14/2007   no CPAP   OSTEOPENIA 09/30/2008   Persistent atrial fibrillation (HCC) 02/06/2020   Polyneuropathy associated with underlying disease (HCC) 06/09/2021   Pulmonary hypertension (HCC) on echocardiogram 01/14/2018   Secondary esophageal varices without bleeding (HCC) 12/23/2020   Formatting of this note might be different from the original. Patient had trace esophageal varices during small bowel enteroscopy in October 2021, follow-up EGD in January 2022 with no varices.  She previously had GI bleeding thought to be from AVM versus GAVE.  She is no longer on carvedilol and will need surveillance endoscopy.  Patient was previously followed at [facility] and I have asked her    Secondary hypercoagulable state (HCC) 02/06/2020   Type 2 diabetes, controlled, with renal manifestation (HCC) 11/24/2013    Past Surgical History: Past Surgical History:  Procedure Laterality Date   A/V FISTULAGRAM N/A 03/28/2021   Procedure: A/V FISTULAGRAM;  Surgeon: Maeola Harman, MD;  Location: [facility] INVASIVE CV LAB;  Service: Cardiovascular;  Laterality: N/A;   APPENDECTOMY     AV FISTULA PLACEMENT Left 08/05/2020   Procedure: LEFT UPPER EXTREMITY ARTERIOVENOUS (AV) FISTULA CREATION;  Surgeon: Leonie Douglas, MD;  Location: [facility] OR;  Service: Vascular;  Laterality: Left;   BALLOON ENTEROSCOPY N/A 06/21/2022   Procedure: BALLOON ENTEROSCOPY;  Surgeon: Shellia Cleverly, DO;  Location: [facility] ENDOSCOPY;  Service: Gastroenterology;  Laterality: N/A;   BASCILIC VEIN TRANSPOSITION Left 09/16/2020   Procedure: LEFT SECOND STAGE BASCILIC VEIN TRANSPOSITION;  Surgeon: Leonie Douglas, MD;   Location: [facility] OR;  Service: Vascular;  Laterality: Left;  PERIPHERAL NERVE BLOCK   CARDIAC CATHETERIZATION     CARDIOVERSION N/A 12/14/2014   Procedure: CARDIOVERSION;  Surgeon: Lars Masson, MD;  Location: [facility] ENDOSCOPY;  Service: Cardiovascular;  Laterality: N/A;   CARDIOVERSION N/A 02/26/2017   Procedure: CARDIOVERSION;  Surgeon: Marinus Maw, MD;  Location: [facility] INVASIVE CV LAB;  Service: Cardiovascular;  Laterality: N/A;   CARDIOVERSION N/A 07/24/2017   Procedure: CARDIOVERSION;  Surgeon: Elease Hashimoto Deloris Ping, MD;  Location: [facility] ENDOSCOPY;  Service: Cardiovascular;  Laterality: N/A;   CARDIOVERSION N/A 02/12/2020  Procedure: CARDIOVERSION;  Surgeon: Laurey Morale, MD;  Location: [facility] ENDOSCOPY;  Service: Cardiovascular;  Laterality: N/A;   CARDIOVERSION N/A 09/15/2021   Procedure: CARDIOVERSION;  Surgeon: Laurey Morale, MD;  Location: [facility] ENDOSCOPY;  Service: Cardiovascular;  Laterality: N/A;   CARPAL TUNNEL RELEASE  07/21/2021   COLONOSCOPY N/A 11/08/2017   Procedure: COLONOSCOPY;  Surgeon: Rachael Fee, MD;  Location: [facility] ENDOSCOPY;  Service: Endoscopy;  Laterality: N/A;   COLONOSCOPY WITH PROPOFOL N/A 05/31/2020   Procedure: COLONOSCOPY WITH PROPOFOL;  Surgeon: Hilarie Fredrickson, MD;  Location: [facility] ENDOSCOPY;  Service: Endoscopy;  Laterality: N/A;   COLONOSCOPY WITH PROPOFOL N/A 10/26/2021   Procedure: COLONOSCOPY WITH PROPOFOL;  Surgeon: Imogene Burn, MD;  Location: [facility] ENDOSCOPY;  Service: Gastroenterology;  Laterality: N/A;   DILATION AND CURETTAGE OF UTERUS     ENTEROSCOPY N/A 03/02/2020   Procedure: ENTEROSCOPY;  Surgeon: Benancio Deeds, MD;  Location: [facility] ENDOSCOPY;  Service: Gastroenterology;  Laterality: N/A;   ENTEROSCOPY N/A 06/08/2022   Procedure: ENTEROSCOPY;  Surgeon: Beverley Fiedler, MD;  Location: [facility] ENDOSCOPY;  Service: Gastroenterology;  Laterality: N/A;   EP IMPLANTABLE DEVICE N/A 10/06/2015   Procedure: BIV ICD Generator Changeout;  Surgeon: Duke Salvia, MD;   Location: [facility] INVASIVE CV LAB;  Service: Cardiovascular;  Laterality: N/A;   ESOPHAGOGASTRODUODENOSCOPY N/A 10/26/2017   Procedure: ESOPHAGOGASTRODUODENOSCOPY (EGD);  Surgeon: Meryl Dare, MD;  Location: [facility] ENDOSCOPY;  Service: Endoscopy;  Laterality: N/A;   ESOPHAGOGASTRODUODENOSCOPY (EGD) WITH PROPOFOL N/A 11/07/2017   Procedure: ESOPHAGOGASTRODUODENOSCOPY (EGD) WITH PROPOFOL;  Surgeon: Rachael Fee, MD;  Location: [facility] ENDOSCOPY;  Service: Endoscopy;  Laterality: N/A;   ESOPHAGOGASTRODUODENOSCOPY (EGD) WITH PROPOFOL N/A 05/29/2020   Procedure: ESOPHAGOGASTRODUODENOSCOPY (EGD) WITH PROPOFOL;  Surgeon: Sherrilyn Rist, MD;  Location: [facility] ENDOSCOPY;  Service: Gastroenterology;  Laterality: N/A;   ESOPHAGOGASTRODUODENOSCOPY (EGD) WITH PROPOFOL N/A 10/26/2021   Procedure: ESOPHAGOGASTRODUODENOSCOPY (EGD) WITH PROPOFOL;  Surgeon: Imogene Burn, MD;  Location: [facility] ENDOSCOPY;  Service: Gastroenterology;  Laterality: N/A;   ESOPHAGOGASTRODUODENOSCOPY (EGD) WITH PROPOFOL N/A 03/08/2022   Procedure: ESOPHAGOGASTRODUODENOSCOPY (EGD) WITH PROPOFOL;  Surgeon: Lynann Bologna, MD;  Location: [facility] ENDOSCOPY;  Service: Gastroenterology;  Laterality: N/A;   GIVENS CAPSULE STUDY N/A 05/19/2020   Procedure: GIVENS CAPSULE STUDY;  Surgeon: Iva Boop, MD;  Location: [facility] ENDOSCOPY;  Service: Endoscopy;  Laterality: N/A;  .adm for obs since pacemaker, PA wil enter order and see pt   GIVENS CAPSULE STUDY N/A 05/29/2020   Procedure: GIVENS CAPSULE STUDY;  Surgeon: Sherrilyn Rist, MD;  Location: [facility] ENDOSCOPY;  Service: Gastroenterology;  Laterality: N/A;   GIVENS CAPSULE STUDY N/A 06/08/2022   Procedure: GIVENS CAPSULE STUDY;  Surgeon: Beverley Fiedler, MD;  Location: [facility] ENDOSCOPY;  Service: Gastroenterology;  Laterality: N/A;   HEMATOMA EVACUATION Left 09/24/2020   Procedure: EVACUATION HEMATOMA ARM;  Surgeon: Larina Earthly, MD;  Location[facility] OR;  Service: Vascular;  Laterality: Left;   HEMOSTASIS CLIP PLACEMENT   06/21/2022   Procedure: HEMOSTASIS CLIP PLACEMENT;  Surgeon: Shellia Cleverly, DO;  Location: [facility] ENDOSCOPY;  Service: Gastroenterology;;   HOT HEMOSTASIS N/A 10/26/2017   Procedure: HOT HEMOSTASIS (ARGON PLASMA COAGULATION/BICAP);  Surgeon: Meryl Dare, MD;  Location: [facility] ENDOSCOPY;  Service: Endoscopy;  Laterality: N/A;   HOT HEMOSTASIS N/A 03/02/2020   Procedure: HOT HEMOSTASIS (ARGON PLASMA COAGULATION/BICAP);  Surgeon: Benancio Deeds, MD;  Location: [facility] ENDOSCOPY;  Service: Gastroenterology;  Laterality: N/A;   HOT HEMOSTASIS N/A 05/31/2020   Procedure:  HOT HEMOSTASIS (ARGON PLASMA COAGULATION/BICAP);  Surgeon: Hilarie Fredrickson, MD;  Location: [facility] ENDOSCOPY;  Service: Endoscopy;  Laterality: N/A;   HOT HEMOSTASIS N/A 03/08/2022   Procedure: HOT HEMOSTASIS (ARGON PLASMA COAGULATION/BICAP);  Surgeon: Lynann Bologna, MD;  Location: [facility] ENDOSCOPY;  Service: Gastroenterology;  Laterality: N/A;   HOT HEMOSTASIS N/A 06/08/2022   Procedure: HOT HEMOSTASIS (ARGON PLASMA COAGULATION/BICAP);  Surgeon: Beverley Fiedler, MD;  Location: [facility] ENDOSCOPY;  Service: Gastroenterology;  Laterality: N/A;   HOT HEMOSTASIS N/A 06/21/2022   Procedure: HOT HEMOSTASIS (ARGON PLASMA COAGULATION/BICAP);  Surgeon: Shellia Cleverly, DO;  Location[facility] ENDOSCOPY;  Service: Gastroenterology;  Laterality: N/A;   ICD GENERATOR CHANGEOUT N/A 07/21/2022   Procedure: ICD GENERATOR CHANGEOUT;  Surgeon: Duke Salvia, MD;  Location: [facility] INVASIVE CV LAB;  Service: Cardiovascular;  Laterality: N/A;   INSERT / REPLACE / REMOVE PACEMAKER  ?2008   IR PERC TUN PERIT CATH WO PORT S&I /IMAG  07/05/2020   PERIPHERAL VASCULAR INTERVENTION Left 03/28/2021   Procedure: PERIPHERAL VASCULAR INTERVENTION;  Surgeon: Maeola Harman, MD;  Location: [facility] INVASIVE CV LAB;  Service: Cardiovascular;  Laterality: Left;   POLYPECTOMY  11/08/2017   Procedure: POLYPECTOMY;  Surgeon: Rachael Fee, MD;  Location: [facility] ENDOSCOPY;  Service: Endoscopy;;    PROCTOSCOPY N/A 05/22/2018   Procedure: RIGID PROCTOSCOPY;  Surgeon: Karie Soda, MD;  Location: [facility] ORS;  Service: General;  Laterality: N/A;   RIGHT HEART CATH N/A 02/13/2018   Procedure: RIGHT HEART CATH;  Surgeon: Laurey Morale, MD;  Location: [facility] INVASIVE CV LAB;  Service: Cardiovascular;  Laterality: N/A;   RIGHT HEART CATH N/A 06/23/2020   Procedure: RIGHT HEART CATH;  Surgeon: Laurey Morale, MD;  Location: [facility] INVASIVE CV LAB;  Service: Cardiovascular;  Laterality: N/A;   RIGHT/LEFT HEART CATH AND CORONARY ANGIOGRAPHY N/A 12/11/2019   Procedure: RIGHT/LEFT HEART CATH AND CORONARY ANGIOGRAPHY;  Surgeon: Laurey Morale, MD;  Location: [facility] INVASIVE CV LAB;  Service: Cardiovascular;  Laterality: N/A;   SUBMUCOSAL TATTOO INJECTION  03/02/2020   Procedure: SUBMUCOSAL TATTOO INJECTION;  Surgeon: Benancio Deeds, MD;  Location: [facility] ENDOSCOPY;  Service: Gastroenterology;;   SUBMUCOSAL TATTOO INJECTION  05/31/2020   Procedure: SUBMUCOSAL TATTOO INJECTION;  Surgeon: Hilarie Fredrickson, MD;  Location: [facility] ENDOSCOPY;  Service: Endoscopy;;   SUBMUCOSAL TATTOO INJECTION  06/21/2022   Procedure: SUBMUCOSAL TATTOO INJECTION;  Surgeon: Shellia Cleverly, DO;  Location: [facility] ENDOSCOPY;  Service: Gastroenterology;;   TEE WITHOUT CARDIOVERSION N/A 08/02/2021   Procedure: TRANSESOPHAGEAL ECHOCARDIOGRAM (TEE);  Surgeon: Laurey Morale, MD;  Location: [facility] ENDOSCOPY;  Service: Cardiovascular;  Laterality: N/A;   TUBAL LIGATION     XI ROBOTIC ASSISTED LOWER ANTERIOR RESECTION N/A 05/22/2018   Procedure: XI ROBOTIC ASSISTED SIGMOID COLOECTOMY MOBILIZATION OF SPLENIC FLEXURE, FIREFLY ASSESSMENT OF PERFUSION;  Surgeon: Karie Soda, MD;  Location: [facility] ORS;  Service: General;  Laterality: N/A;  ERAS PATHWAY    Family History: Family History  Problem Relation Age of Onset   Asthma Father    Heart attack Father    Asthma Sister    Lung cancer Sister    Heart attack Mother    Stroke Brother    Colon  cancer Neg Hx    Esophageal cancer Neg Hx    Pancreatic cancer Neg Hx    Stomach cancer Neg Hx    Liver disease Neg Hx     Social History: Social History   Socioeconomic History   Marital status: Divorced  Spouse name: Not on file   Number of children: 1   Years of education: 35   Highest education level: 12th grade  Occupational History   Not on file  Tobacco Use   Smoking status: Never   Smokeless tobacco: Never  Vaping Use   Vaping Use: Never used  Substance and Sexual Activity   Alcohol use: Not Currently    Comment: 01/03/2018 "couple drinks/month"   Drug use: Not Currently    Types: Marijuana    Comment: VERY INTERMITTENT    Sexual activity: Not Currently  Other Topics Concern   Not on file  Social History Narrative   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      Home Situation: lives alone      Spiritual Beliefs: Christian - [employer]      Lifestyle: no regular exercise; poor diet      She is divorced at least one adult child   Never smoker    rare alcohol to occasional at best    no substance abuse         Social Determinants of Health   Financial Resource Strain: Low Risk  (09/04/2022)   Overall Financial Resource Strain (CARDIA)    Difficulty of Paying Living Expenses: Not hard at all  Food Insecurity: No Food Insecurity (09/04/2022)   Hunger Vital Sign    Worried About Running Out of Food in the Last Year: Never true    Ran Out of Food in the Last Year: Never true  Transportation Needs: No Transportation Needs (09/04/2022)   PRAPARE - Administrator, Civil Service (Medical): No    Lack of Transportation (Non-Medical): No  Physical Activity: Insufficiently Active (09/04/2022)   Exercise Vital Sign    Days of Exercise per Week: 1 day    Minutes of Exercise per Session: 20 min  Stress: Stress Concern Present (09/04/2022)   [employer] of Occupational Health - Occupational Stress Questionnaire    Feeling of Stress : To some extent   Social Connections: Moderately Isolated (09/04/2022)   Social Connection and Isolation Panel [NHANES]    Frequency of Communication with Friends and Family: More than three times a week    Frequency of Social Gatherings with Friends and Family: Three times a week    Attends Religious Services: More than 4 times per year    Active Member of Clubs or Organizations: No    Attends Engineer, structural: Not on file    Marital Status: Divorced    Allergies:  No Known Allergies  Objective:    Vital Signs:   Temp:  [96.6 F (35.9 C)-100 F (37.8 C)] 97.9 F (36.6 C) (06/05 1101) Pulse Rate:  [68-132] 75 (06/05 1058) Resp:  [4-34] 22 (06/05 1058) BP: (90-188)/(34-67) 101/42 (06/05 1058) SpO2:  [97 %-100 %] 100 % (06/05 1058) FiO2 (%):  [40 %-100 %] 40 % (06/05 1007) Weight:  [60.4 kg-61.6 kg] 60.4 kg (06/05 1101) Last BM Date : 10/10/22  Weight change: Filed Weights   10/11/22 0112 10/11/22 0705 10/11/22 1101  Weight: 61.6 kg 61.6 kg 60.4 kg    Intake/Output:   Intake/Output Summary (Last 24 hours) at 10/11/2022 1107 Last data filed at 10/11/2022 1101 Gross per 24 hour  Intake 2044.16 ml  Output 1000 ml  Net 1044.16 ml      Physical Exam    General:  fatigued and chronically appearing. No resp difficulty HEENT: normal + trach collar  Neck: supple. JVP 12  cm . Carotids 2+ bilat; no bruits. No lymphadenopathy or thyromegaly appreciated. Cor: PMI nondisplaced. Regular rate & rhythm. 2/6 diastolic murmur. ?rub  Lungs: on vent via trach, + diffuse expiratory wheezing  Abdomen: soft, nontender, nondistended. No hepatosplenomegaly. No bruits or masses. Good bowel sounds. Extremities: no cyanosis, clubbing, rash, edema Neuro: alert & orientedx3, cranial nerves grossly intact. moves all 4 extremities w/o difficulty. Affect pleasant   Telemetry   V paced 70s   EKG    No new EKG to review   Labs   Basic Metabolic Panel: Recent Labs  Lab 10/06/22 1735  10/07/22 0430 10/07/22 0710 10/07/22 1304 10/07/22 1755 10/08/22 0553 10/08/22 1644 10/09/22 0034 10/10/22 0422 10/10/22 0509 10/11/22 0236  NA 139   < > 138  --   --  140  --  142 135 136 132*  K 4.0   < > 5.2*  --   --  3.9  --  3.9 3.4* 3.2* 4.7  CL 90*  --  92*  --   --  97*  --  100 95*  --  91*  CO2 19*  --  19*  --   --  23  --  22 22  --  21*  GLUCOSE 270*  --  167*  --   --  187*  --  162* 215*  --  102*  BUN 27*  --  37*  --   --  64*  --  80* 43*  --  60*  CREATININE 3.69*  --  4.65*  --   --  6.52*  --  7.50* 4.55*  --  5.88*  CALCIUM 7.4*  --  8.7*  --   --  8.1*  --  7.6* 8.1*  --  8.8*  MG 2.2  --   --  2.3 2.2 2.3 2.4  --   --   --   --   PHOS 7.9*  --   --  6.9* 5.9* 6.1* 5.4*  --   --   --   --    < > = values in this interval not displayed.    Liver Function Tests: Recent Labs  Lab 10/07/22 0710 10/08/22 0553 10/09/22 0034 10/10/22 0422 10/11/22 0236  AST 366* 262* 220* 205* 207*  ALT 540* 370* 303* 249* 229*  ALKPHOS 138* 104 92 107 123  BILITOT 4.7* 6.2* 7.1* 9.6* 11.2*  PROT 8.5* 7.2 7.1 6.8 6.9  ALBUMIN 3.5 2.9* 2.6* 2.4* 2.2*   No results for input(s): "LIPASE", "AMYLASE" in the last 168 hours. Recent Labs  Lab 10/06/22 0229  AMMONIA 25    CBC: Recent Labs  Lab 10/05/22 0315 10/06/22 0229 10/06/22 1735 10/07/22 0122 10/08/22 1817 10/09/22 0034 10/09/22 1833 10/10/22 0422 10/10/22 0509 10/11/22 0236  WBC 8.7   < > 12.1*   < > 14.3* 10.4 13.3* 14.9*  --  15.8*  NEUTROABS 8.4*  --  11.5*  --   --   --   --   --   --   --   HGB 12.5   < > 11.4*   < > 11.0* 10.3* 10.2* 10.5* 12.6 10.6*  HCT 38.8   < > 36.3   < > 34.0* 33.1* 31.6* 31.5* 37.0 32.6*  MCV 88.2   < > 91.2   < > 89.5 91.4 89.0 87.5  --  89.3  PLT 123*   < > 94*   < > 60* 57* 56* 67*  --  81*   < > = values in this interval not displayed.    Cardiac Enzymes: No results for input(s): "CKTOTAL", "CKMB", "CKMBINDEX", "TROPONINI" in the last 168 hours.  BNP: BNP (last 3  results) Recent Labs    03/27/22 1650 09/28/22 1834 09/30/22 1515  BNP 1,685.0* 1,552.2* 1,538.3*    ProBNP (last 3 results) Recent Labs    11/10/21 1151 11/17/21 1033 03/20/22 1159  PROBNP 1,656.0* 2,020.0* 948.0*     CBG: Recent Labs  Lab 10/10/22 1610 10/10/22 1938 10/10/22 2325 10/11/22 0328 10/11/22 0809  GLUCAP 147* 146* 143* 84 105*    Coagulation Studies: No results for input(s): "LABPROT", "INR" in the last 72 hours.   Imaging   DG Abd Portable 1V  Result Date: 10/11/2022 CLINICAL DATA:  Feeding tube placement. EXAM: PORTABLE ABDOMEN - 1 VIEW COMPARISON:  10/07/2022 FINDINGS: Feeding tube tip is in the distal stomach with the tip directed towards the pylorus. Visualized abdomen demonstrates nonspecific gas pattern. IMPRESSION: Feeding tube tip is in the distal stomach directed towards the pylorus. Electronically Signed   By: Kennith Center M.D.   On: 10/11/2022 10:43   DG Chest Port 1 View  Result Date: 10/10/2022 CLINICAL DATA:  Status post tracheostomy EXAM: PORTABLE CHEST 1 VIEW COMPARISON:  Chest x-ray 10/09/2022 FINDINGS: There is a new tracheostomy with distal tip 3.7 cm above the carina. Left-sided ICD and right-sided central venous catheter are unchanged in position. The heart is enlarged. Bilateral mid and lower lung airspace opacities persist. There is no pleural effusion or pneumothorax. The osseous structures are stable. IMPRESSION: 1. New tracheostomy with distal tip 3.7 cm above the carina. 2. Persistent bilateral mid and lower lung airspace opacities. Electronically Signed   By: Darliss Cheney M.D.   On: 10/10/2022 16:04     Medications:     Current Medications:  Chlorhexidine Gluconate Cloth  6 each Topical Q0600   Chlorhexidine Gluconate Cloth  6 each Topical Q0600   docusate  100 mg Per Tube BID   doxercalciferol  4 mcg Intravenous Q M,W,F-HD   feeding supplement (PROSource TF20)  60 mL Per Tube Daily   insulin aspart  0-15 Units  Subcutaneous Q4H   insulin glargine-yfgn  5 Units Subcutaneous Daily   ipratropium-albuterol  3 mL Nebulization Q6H   levothyroxine  125 mcg Per Tube Q0600   lidocaine  1 patch Transdermal Q24H   multivitamin  1 tablet Per Tube QHS   mouth rinse  15 mL Mouth Rinse Q2H   pantoprazole (PROTONIX) IV  40 mg Intravenous Q12H   polyethylene glycol  17 g Per Tube Daily   thiamine  100 mg Per Tube Daily    Infusions:  sodium chloride Stopped (10/07/22 1027)   sodium chloride     cefTAZidime (FORTAZ)  IV Stopped (10/10/22 1746)   feeding supplement (VITAL 1.5 CAL) 40 mL/hr at 10/11/22 0855   heparin 400 Units/hr (10/11/22 0855)   norepinephrine (LEVOPHED) Adult infusion 2 mcg/min (10/11/22 0855)      Patient Profile   77 y.o. with history of nonischemic cardiomyopathy with recovery of EF with BiV pacing, recurrent GI bleeding due to gastric AVMs, paroxysmal atrial flutter, cirrhosis s/p treatment with Harvoni for Hep C, aortic insuffiencey and ESRD on HD, admitted w/ acute hypoxic respiratory failure and lactic acidosis in the setting of rhinovirus pneumonitis. Viral panel + for rhinovirus/Enterovirus. Hospital course c/b PEA arrest and NSTEMI. Echo w/ drop in EF. Remains vent dependent, s/p trach. AHF team consulted for  HF management.   Assessment/Plan   1. Acute Hypoxic Respiratory Failure - initially required NRB. Chest CT - for PE. Viral panel + for rhinovirus/Enterovirus - Initial resp failure felt 2/2 rhinovirus pneumonitis - Intubated on 5/31 post PEA arrest  - Extubated w/ trach placement on 6/4, remains on vent support FiO2 40%, PEEP 5  - per PCCM   2. PEA Arrest - 5/31, ~5 min ACLS + Epi before ROSC  3. NSTEMI/ Known CAD  - LHC 2021 40% pLAD, 50% ost D1 - Hs trop peaked > 24,000, trending down 4,805  - Echo w/ drop in EF  - on heparin gtt  - plan LHC tomorrow   4. HFimEF>>HFrEF  - H/o NICM.  EF improved to 60-65% with MDT BiV pacing  - Echo 8/23 EF 50-55%, with  moderate RV dilation, moderate AI  - Echo this admit EF 40-45%, RV mildly reduced (post PEA arrest)  - HS Trop > 24000. Plan [facility] tomorrow - volume managed by iHD, MWF. Plan session today   5. Rhinovirus/Enterovirus - supportive care, per CCM   6. Hypoxic Respiratory Failure - remains vent dependent, s/p trach 6/4  - per CCM   7. ESRD - on HD MWF - nephrology following   8. Aortic Insuffiencey  - TEE 3/23 showed moderate to severe AI (PHT 413 msec and vena contracta 0.49 cm).  - Repeat echo 8/23 showed moderate AI.  - Echo this admit shows Mod-Severe AI  - ? If TAVR candidate     Length of Stay: [address], PA-C  10/11/2022, 11:07 AM  Advanced Heart Failure Team Pager [phone] (M-F; 7a - 5p)  Please contact [facility] for night-coverage after hours (4p -7a ) and weekends on [email or web address]  Patient seen with PA, agree with the above note.    Patient was admitted with acute hypoxemic respiratory failure from rhinovirus pneumonitis, also appears to have developed a secondary bacterial PNA with Pseudomonas. Initially on NRBM but on 5/31 had PEA arrest thought to be due to hypercarbic respiratory failure. She has remained on vent and now has tracheostomy.  Troponin rose to >24000 after PEA arrest.  She was not initially cathed due to instability.  She is currently off  pressors and on heparin gtt and ceftazidime for Pseudomonas.    I reviewed her echo, EF appears to be around 45% with moderate LVH, moderate RV dysfunction with moderate RV enlargement, D-shaped septum, severe TR, mod-severe AI.  Of note, has wide pulse pressure.   General: Awake Neck: Tracheostomy, unable to see JVD, no thyromegaly or thyroid nodule.  Lungs: Bilateral rhonchi CV: Nondisplaced PMI.  Heart regular, lung noises prominent and unable to hear a murmur.  No peripheral edema.  No carotid bruit.  Normal pedal pulses.  Abdomen: Soft, nontender, no hepatosplenomegaly, no distention.  Skin:  Intact without lesions or rashes.  Neurologic: Follows commands.  Extremities: No clubbing or cyanosis.  HEENT: Normal.   1. Acute hypoxemic respiratory failure: CXR with bilateral airspace disease.  Suspect rhinovirus pneumonitis with superimposed secondary bacterial PNA with Pseudomonas.  Now with tracheostomy.   - Continue ceftazidime.  - Per CCM.  2. CAD: Prior cath in 2021 with nonbstructive disease. Patient had PEA arrest 5/31 thought to be due to hypercarbic respiratory failure.  HS-TnI post-arrest was >24000 suggesting NSTEMI. Echo showed mild LV dysfunction with EF 45%, mildly lower than prior (50-55% in 8/23). No chest pain currently.  - Continue heparin gtt.  - No statin with elevated  LFTs.  Repeat in am and start statin if trending down.  - ASA 81 daily.  - Coronary angiography tomorrow..  Discussed risks/benefits with patient/daughter and they agree to procedure.  3. Acute on chronic HF with mid-range EF: History of NICM with recovery of EF after MDT CRT-D device was placed.  Echo in 8/23 with EF 50-55%.  Echo this admission showed EF 45% with moderate LVH, moderate RV dysfunction with moderate RV enlargement, D-shaped septum, severe TR, mod-severe AI.  IVC dilated on echo, suspect some volume overload.  - Off pressors.  - Volume management via HD.  - Will plan RHC/LHC to assess filling pressures and for significant coronary disease as cause of troponin elevation. Discussed risks/benefits with patient/daughter and they agree to procedure.  - She had a somewhat equivocal PYP scan in the past, consider repeat in future to investigate for TTR cardiac amyloidosis.  Not candidate for cardiac MRI due to her device (not compatible with MRI).  4. Atrial fibrillation: Paroxysmal.  In NSR.  - Heparin gtt (Eliquis on hold).  - Amiodarone on hold with elevated LFTs, can likely restart if trending down (check in am).  5. Aortic insufficiency: Moderate-severe by recent echoes.  Has wide pulse  pressure.   - Consider TAVR in future.  6. PEA arrest: 5/31, due to respiratory failure.  Treated with 5 minutes CPR and epinephrine.  Had rib fractures.  7. ESRD: MWF HD.   Marca Ancona 10/11/2022 3:44 PM

## 2022-10-11 NOTE — Progress Notes (Signed)
ANTICOAGULATION CONSULT NOTE  Pharmacy Consult for IV Heparin Indication: chest pain/ACS  No Known Allergies  Patient Measurements: Weight: 61.6 kg (135 lb 12.9 oz) Heparin Dosing Weight: 54.8 kg  Vital Signs: Temp: 96.6 F (35.9 C) (06/05 0705) Temp Source: Axillary (06/05 0705) BP: 104/41 (06/05 0730) Pulse Rate: 80 (06/05 0739)  Labs: Recent Labs    10/08/22 1644 10/08/22 1817 10/08/22 2006 10/09/22 0034 10/09/22 0034 10/09/22 1108 10/09/22 1833 10/10/22 0422 10/10/22 0509 10/11/22 0236  HGB  --    < >  --  10.3*  --   --  10.2* 10.5* 12.6 10.6*  HCT  --    < >  --  33.1*  --   --  31.6* 31.5* 37.0 32.6*  PLT  --    < >  --  57*  --   --  56* 67*  --  81*  APTT  --   --   --   --    < > 51* 127* 101*  --  74*  HEPARINUNFRC  --   --   --   --   --  <0.10*  --   --   --   --   CREATININE  --   --   --  7.50*  --   --   --  4.55*  --  5.88*  TROPONINIHS >24,000*  --  >24,000*  --   --   --   --  4,805*  --   --    < > = values in this interval not displayed.     Estimated Creatinine Clearance: 6.7 mL/min (A) (by C-G formula based on SCr of 5.88 mg/dL (H)).    Assessment: 60 years age female s/p cardiac arrest with elevated troponin. Platelets noted to be low with dark stools overnight 6/3. Discussed with Dr. Celine Mans - starting IV heparin for ACS without bolus due to bleed risk.  Currently using aPTT to guide heparin dosing because elevated bilirubin could falsely elevate heparin levels.  aPTT therapeutic at 74 sec.  H/H stable, plts improved to 81K.  No bleeding per discussion with RN.  Goal of Therapy:  Heparin level 0.3-0.7 units/ml Monitor platelets by anticoagulation protocol: Yes   Plan:  Continue heparin infusion at 400 units/hr Daily aPTT and CBC Monitor closely for s/sx of bleeding  Retina Bernardy D. Laney Potash, PharmD, BCPS, BCCCP 10/11/2022, 7:55 AM

## 2022-10-11 NOTE — Progress Notes (Signed)
Nutrition Brief Note    Pt OG-tube replaced with a Cortrak today (tip distal stomach). Tube feed regimen already in place, communicated with RN.   Tube Feeds via Cortrak: - Vital 1.5 at 40 mL/hr  - 60 mL ProSource TF20 - Daily Provides 1520 kcal, 85 gm protein, 733 ml free water daily   RD team to follow during admission.   Kirby Crigler RD, LDN Clinical Dietitian See Loretha Stapler for contact information.

## 2022-10-11 NOTE — Progress Notes (Signed)
Physical Therapy Treatment Patient Details Name: Morgan Clay MRN: [id number] DOB: 08/14/1945 Today's Date: 10/11/2022   History of Present Illness 77 yo female [facility] to [facility] on 5/27 with SOB and hypoxia s/p HD. viral panel + for rhinovirus/Enterovirus. Course complicated by PEA arrest 5/31,ROSC achieved after 5 min of CPR 1 round of Epi. pt was intubated and transferred to ICU.  Most likely due to acute hypoxic hypercarb respiratory failure.  Sm Rt sided pneumothorax.  s/p trach on 6/4. PMH: Liver cirrhosis, ESRD on HD MWF, Afib, hx of CVA, anemia, T2DM, HTN, ICD.    PT Comments    Pt resting upon arrival to room, wakes easily. Pt states she is fatigued from HD but is agreeable to PT session. Pt continues to require significant +2 physical assist for bed-level mobility, maintains EOB sitting well with min PT support given L lateral and evolving anterior bias. Pt with tachypnea with initial sitting EOB to 50 breaths/min, suspect secondary to coughing and effort, resolved to 20s-30s for remainder of session. Plan remains appropriate, will continue to follow and may benefit from PT seeing pt on off-day from HD.      Recommendations for follow up therapy are one component of a multi-disciplinary discharge planning process, led by the attending physician.  Recommendations may be updated based on patient status, additional functional criteria and insurance authorization.  Follow Up Recommendations       Assistance Recommended at Discharge Frequent or constant Supervision/Assistance  Patient can return home with the following Two people to help with walking and/or transfers;Two people to help with bathing/dressing/bathroom   Equipment Recommendations  Other (comment) (defer)    Recommendations for Other Services       Precautions / Restrictions Precautions Precautions: Fall;ICD/Pacemaker Precaution Comments: trach vent 40%FiO2/5 PEEP, cortrak, HD cath Restrictions Weight Bearing  Restrictions: No     Mobility  Bed Mobility Overal bed mobility: Needs Assistance Bed Mobility: Rolling, Sidelying to Sit, Sit to Sidelying Rolling: Max assist Sidelying to sit: Max assist, +2 for physical assistance     Sit to sidelying: Max assist, +2 for physical assistance General bed mobility comments: assist for trunk and LE management, scooting to/from EOB, and repositioning in bed    Transfers                   General transfer comment: unable to attempt this date    Ambulation/Gait                   Stairs             Wheelchair Mobility    Modified Rankin (Stroke Patients Only)       Balance Overall balance assessment: Needs assistance Sitting-balance support: No upper extremity supported, Feet supported Sitting balance-Leahy Scale: Poor Sitting balance - Comments: L lateral lean, anterior lean with fatigue this date. EOB sitting x10 minutes with BP stable 110s/60s Postural control: Left lateral lean                                  Cognition Arousal/Alertness: Lethargic Behavior During Therapy: Flat affect Overall Cognitive Status: Impaired/Different from baseline Area of Impairment: Orientation, Attention, Memory, Following commands, Safety/judgement, Awareness, Problem solving                 Orientation Level: Disoriented to, Time, Situation Current Attention Level: Focused   Following Commands: Follows one step commands with increased time Safety/Judgement:  Decreased awareness of safety, Decreased awareness of deficits   Problem Solving: Difficulty sequencing, Requires verbal cues, Requires tactile cues, Slow processing General Comments: pt drowsy, slowed processing time this date. responds yes/no with head nods and opens eyes periodically during session, requires constant stimulation to maintain engaged        Exercises General Exercises - Lower Extremity Long Arc Quad: AAROM, Both, 5 reps,  Seated    General Comments General comments (skin integrity, edema, etc.): 40% FiO2 PEEP 5      Pertinent Vitals/Pain Pain Assessment Pain Assessment: Faces Faces Pain Scale: Hurts little more Pain Location: cortrak, back, LEs, trach site Pain Descriptors / Indicators: Sore, Discomfort, Guarding Pain Intervention(s): Monitored during session, Repositioned, Limited activity within patient's tolerance    Home Living                          Prior Function            PT Goals (current goals can now be found in the care plan section) Acute Rehab PT Goals Patient Stated Goal: back to independence PT Goal Formulation: With patient/family Time For Goal Achievement: 10/23/22 Potential to Achieve Goals: Good Progress towards PT goals: Progressing toward goals    Frequency    Min 3X/week      PT Plan Current plan remains appropriate    Co-evaluation              AM-PAC PT "6 Clicks" Mobility   Outcome Measure  Help needed turning from your back to your side while in a flat bed without using bedrails?: A Lot Help needed moving from lying on your back to sitting on the side of a flat bed without using bedrails?: Total Help needed moving to and from a bed to a chair (including a wheelchair)?: Total Help needed standing up from a chair using your arms (e.g., wheelchair or bedside chair)?: Total Help needed to walk in hospital room?: Total Help needed climbing 3-5 steps with a railing? : Total 6 Click Score: 7    End of Session Equipment Utilized During Treatment: Other (comment) (vent trach) Activity Tolerance: Patient limited by fatigue Patient left: in bed;with call bell/phone within reach;with bed alarm set Nurse Communication: Mobility status PT Visit Diagnosis: Other abnormalities of gait and mobility (R26.89);Muscle weakness (generalized) (M62.81)     Time: 7829-5621 PT Time Calculation (min) (ACUTE ONLY): 19 min  Charges:  $Therapeutic  Activity: 8-22 mins                     Marye Round, PT DPT Acute Rehabilitation Services Secure Chat Preferred  Office [phone]    Sevanna Ballengee E Christain [place] 10/11/2022, 3:17 PM

## 2022-10-11 NOTE — Progress Notes (Signed)
Received patient in bed.  Respond to voice; on trach/vent. Informed consent signed and in chart.   TX duration: 3.5hrs  Patient tolerated well.  Alert, without acute distress.  Hand-off given to patient's nurse.   Access used: R IJ Access issues: None  Total UF removed: Medication(s) given: Hectorol IV  Post HD weight: 60.4kg   10/11/22 1101  Vitals  Temp 97.9 F (36.6 C)  Temp Source Oral  BP (!) 115/44  MAP (mmHg) (!) 64  BP Location Right Arm  BP Method Automatic  Patient Position (if appropriate) Lying  Pulse Rate 75  Pulse Rate Source Monitor  ECG Heart Rate 75  Resp (!) 23  Oxygen Therapy  SpO2 97 %  O2 Device Ventilator  During Treatment Monitoring  Intra-Hemodialysis Comments Tolerated well  Post Treatment  Dialyzer Clearance Lightly streaked  Duration of HD Treatment -hour(s) 3.5 hour(s)  Liters Processed 84  Fluid Removed (mL) 1000 mL  Tolerated HD Treatment Yes  Hemodialysis Catheter Right Subclavian Double lumen Permanent (Tunneled)  No placement date or time found.   Placed prior to admission: Yes  Orientation: Right  Access Location: Subclavian  Hemodialysis Catheter Type: Double lumen Permanent (Tunneled)  Site Condition No complications  Blue Lumen Status Dead end cap in place;Heparin locked  Red Lumen Status Dead end cap in place;Heparin locked  Purple Lumen Status N/A  Catheter fill solution Heparin 1000 units/ml  Catheter fill volume (Arterial) 1.9 cc  Catheter fill volume (Venous) 1.9  Dressing Type Transparent  Dressing Status Antimicrobial disc in place;Clean, Dry, Intact  Interventions Other (Comment)  Drainage Description None  Dressing Change Due 10/17/22  Post treatment catheter status Capped and Clamped     [facility]

## 2022-10-11 NOTE — Procedures (Signed)
I was present at this dialysis session. I have reviewed the session itself and made appropriate changes.  Sp Trach yesterday.  Bp soft, will try to UF 1 liter as she is 4 kg above edw.  Pressor support as needed.  Vital signs in last 24 hours:  Temp:  [96.6 F (35.9 C)-100 F (37.8 C)] 97.5 F (36.4 C) (06/05 0833) Pulse Rate:  [68-102] 78 (06/05 0845) Resp:  [16-34] 21 (06/05 0845) BP: (90-188)/(34-67) 90/41 (06/05 0845) SpO2:  [97 %-100 %] 99 % (06/05 0845) FiO2 (%):  [40 %-100 %] 40 % (06/05 0739) Weight:  [61.6 kg] 61.6 kg (06/05 0705) Weight change: 6.8 kg Filed Weights   10/10/22 0351 10/11/22 0112 10/11/22 0705  Weight: 56.5 kg 61.6 kg 61.6 kg    Recent Labs  Lab 10/08/22 1644 10/09/22 0034 10/11/22 0236  NA  --    < > 132*  K  --    < > 4.7  CL  --    < > 91*  CO2  --    < > 21*  GLUCOSE  --    < > 102*  BUN  --    < > 60*  CREATININE  --    < > 5.88*  CALCIUM  --    < > 8.8*  PHOS 5.4*  --   --    < > = values in this interval not displayed.    Recent Labs  Lab 10/05/22 0315 10/06/22 0229 10/06/22 1735 10/07/22 0122 10/09/22 1833 10/10/22 0422 10/10/22 0509 10/11/22 0236  WBC 8.7   < > 12.1*   < > 13.3* 14.9*  --  15.8*  NEUTROABS 8.4*  --  11.5*  --   --   --   --   --   HGB 12.5   < > 11.4*   < > 10.2* 10.5* 12.6 10.6*  HCT 38.8   < > 36.3   < > 31.6* 31.5* 37.0 32.6*  MCV 88.2   < > 91.2   < > 89.0 87.5  --  89.3  PLT 123*   < > 94*   < > 56* 67*  --  81*   < > = values in this interval not displayed.    Scheduled Meds:  Chlorhexidine Gluconate Cloth  6 each Topical Q0600   Chlorhexidine Gluconate Cloth  6 each Topical Q0600   docusate  100 mg Per Tube BID   doxercalciferol  4 mcg Intravenous Q M,W,F-HD   feeding supplement (PROSource TF20)  60 mL Per Tube Daily   insulin aspart  0-15 Units Subcutaneous Q4H   insulin glargine-yfgn  5 Units Subcutaneous Daily   ipratropium-albuterol  3 mL Nebulization Q6H   levothyroxine  125 mcg Per Tube  Q0600   lidocaine  1 patch Transdermal Q24H   multivitamin  1 tablet Per Tube QHS   mouth rinse  15 mL Mouth Rinse Q2H   pantoprazole (PROTONIX) IV  40 mg Intravenous Q12H   polyethylene glycol  17 g Per Tube Daily   thiamine  100 mg Per Tube Daily   Continuous Infusions:  sodium chloride Stopped (10/07/22 1027)   sodium chloride     cefTAZidime (FORTAZ)  IV Stopped (10/10/22 1746)   feeding supplement (VITAL 1.5 CAL) 40 mL/hr at 10/11/22 0855   heparin 400 Units/hr (10/11/22 0855)   norepinephrine (LEVOPHED) Adult infusion 2 mcg/min (10/11/22 0855)   PRN Meds:.Place/Maintain arterial line **AND** sodium chloride, docusate sodium, fentaNYL (SUBLIMAZE)  injection, fentaNYL (SUBLIMAZE) injection, mineral oil-hydrophilic petrolatum, mouth rinse   Irena Cords,  MD 10/11/2022, 9:01 AM

## 2022-10-11 NOTE — Progress Notes (Signed)
NAME:  Morgan Clay, MRN:  [id number], DOB:  February 25, 1946, LOS: 9 ADMISSION DATE:  10/02/2022, CONSULTATION DATE:  10/02/22 REFERRING MD:  Ernie Avena, MD CHIEF COMPLAINT:  Respiratory Failure  History of Present Illness:  Morgan Clay is a 77 year old woman, never smoker with history of heart failure, pulmonary hypertension, asthma, ESRD on HD, cirrhosis secondary to hepatitis C s/p Harvoni treatment, atrial fibrillation and esophageal varices/small bowel AVMs who presented to [facility] with shortness of breath after dialysis, she was tachypneic with an SpO2 on arrival in the ER.   She was noted to have Lactic acid of 7.7 and was given a bolus with repeat lactic acid 7.6. Due to increase work of breathing she was placed on non-rebreather mask. She was given duoneb and solumedrol 125mg  IV. CTA Chest was negative for PE, there was motion artifact but mosaic attenuation of the pulmonary parenchyma. CT Neck showed possible mild pharyngeal mucosal swelling, but again difficult due to motion artifact. Neck CT was done for neck swelling. CT abdomen done for abdominal pain with no acute findings.   PCCM consulted as patient remained tachypneic with increased work of breathing after transfer from non-rebreather to HFNC.   Patient accepted in transfer from [facility] to [facility] ICU.  Pertinent  Medical History   Asthma Pulmonary Hypertension OSA Chronic Systolic Heart Failure Atrial Fibrillation DMII ESRD on HD Cirrhosis Hepatitis C s/p Harvoni Esophageal Varices Duodenal AVM  Significant Hospital Events: Including procedures, antibiotic start and stop dates in addition to other pertinent events   5/27 presented to [facility], transferred to [facility] 5/28 remains on 12L, stable, viral panel + for rhinovirus/Enterovirus  5/29 lactic improving, surgery following serial abdominal exams, down to 4L [place].  5/30 down to room air. Initial resp failure felt  2/2 rhinovirus pneumonitis. Lactate cleared felt 2/2 generalized multiorgan failure which was slow to clear d/t cirrhosis. Remained encephalopathic  5/31 surgery cleared for diet and signed off. Went to [employer]. Completed HD was found by family unresponsive. Not initially on tele. Staff called to room. She had no pulse. Rhythm PEA. Underwent 5 minutes of ACLS 1 round epi before ROSC. Did not regain LOC so intubated and transferred to ICU. Copious oral secretions noted during intubation 6/2 extubated and required reintubation later the same day 6/4 tracheostomy placed  Interim History / Subjective:  No overnight issue.s pulling off 1L with dialysis, required low dose vasopressor for this.  Objective   Blood pressure (!) 115/44, pulse 75, temperature 98.6 F (37 C), temperature source Axillary, resp. rate (!) 23, weight 60.4 kg, SpO2 97 %.    Vent Mode: PSV;CPAP FiO2 (%):  [40 %-100 %] 40 % Set Rate:  [30 bmp] 30 bmp Vt Set:  [350 mL] 350 mL PEEP:  [5 cmH20] 5 cmH20 Pressure Support:  [10 cmH20-12 cmH20] 10 cmH20 Plateau Pressure:  [17 cmH20-22 cmH20] 22 cmH20   Intake/Output Summary (Last 24 hours) at 10/11/2022 1127 Last data filed at 10/11/2022 1101 Gross per 24 hour  Intake 2044.16 ml  Output 1000 ml  Net 1044.16 ml   Filed Weights   10/11/22 0112 10/11/22 0705 10/11/22 1101  Weight: 61.6 kg 61.6 kg 60.4 kg   Gen:      Acutely and chronically ill HEENT:  trach to vent Lungs:    sounds of mechanical ventilation auscultated, no wheeze, 6.0 cuffed shiley to vent.  CV:         RRR Abd:      +  bowel sounds; soft, non-tender; no palpable masses, no distension Ext:    No edema Skin:      Warm and dry; no rashes Neuro:  RASS 0 -1     Resolved Hospital Problem list     Lactic acidosis Hyperkalemia Traumatic pneumothorax  Assessment & Plan:   Acute hypoxic Respiratory Failure s/p PEA arrest c/b aspiration PNA and rib fractures 5/31.superimposed on recent  viral pneumonia and asthma  exacerbation and h/o OSA and PH.  Pseudomonas Pneumonia Mixed septic/cardiogenic shock Plan Sensitivities reviewed - will change to ceftazidime for total 7 days for pneumonia S/p tracheostomy  Circulatory Shock s/p cardiac arrest w/ known Pulmonary Hypertension,  LVH, HFrEF right heart failure and aortic insuff. History of BiV-ICD with CRT.  Suspected Hypoxemia mediated PEA cardiac arrest in hospital 5/31. Time to ROSC 5 minutes.  NSTEMI Paroxysmal Atrial fib  Plan  - continue heparin gtt, eventually can transition back to DOAC after cath.  - cardiology consulted, plan for LHC 6/6.   Anemia of Chronic Disease Melenontic stools - no recurrence Plan Cbc Transfuse for hgb < 7  Not requiring transfusions at this time. Continue bid ppi  Acute metabolic encephalopathy CT head was neg. Ammonia was nml  Clinically improved but at risk for delirium  Type 2 DM - resume tube feeds, SSI with tube feed coverage  Abdominal Pain Cirrhosis Elevated Liver Enzymes - initial CTA without acute pathology - Liver enzymes elevated in setting of hepatic congestion due to Right heart failure and hypoxemia TFs  ESRD on HD Plan Nephro following HD 6/3 with some vasopressors.   Best Practice (right click and "Reselect all SmartList Selections" daily)   Diet/type: NPO w/ meds via tube DVT prophylaxis: SCD GI prophylaxis: N/A Lines: N/A Foley:  N/A Code Status:  full code Last date of multidisciplinary goals of care discussion [daughter updated at bedside 6/4]  Dispo - plan for [facility] later this week.  The patient is critically ill due to shock, respiratory failure.  Critical care was necessary to treat or prevent imminent or life-threatening deterioration.  Critical care was time spent personally by me on the following activities: development of treatment plan with patient and/or surrogate as well as nursing, discussions with consultants, evaluation of patient's response to treatment,  examination of patient, obtaining history from patient or surrogate, ordering and performing treatments and interventions, ordering and review of laboratory studies, ordering and review of radiographic studies, pulse oximetry, re-evaluation of patient's condition and participation in multidisciplinary rounds.   Critical Care Time devoted to patient care services described in this note is 34 minutes. This time reflects time of care of this signee Charlott Holler . This critical care time does not reflect separately billable procedures or procedure time, teaching time or supervisory time of PA/NP/Med student/Med Resident etc but could involve care discussion time.       Charlott Holler [facility] 10/11/2022 11:27 AM  Pager: see AMION  If no response to pager , please call critical care on call (see AMION) until 7pm After 7:00 pm call Elink

## 2022-10-12 ENCOUNTER — Encounter (HOSPITAL_COMMUNITY): Admission: EM | Disposition: E | Payer: Self-pay | Source: Home / Self Care | Attending: Internal Medicine

## 2022-10-12 DIAGNOSIS — I2510 Atherosclerotic heart disease of native coronary artery without angina pectoris: Secondary | ICD-10-CM | POA: Diagnosis not present

## 2022-10-12 DIAGNOSIS — I214 Non-ST elevation (NSTEMI) myocardial infarction: Secondary | ICD-10-CM

## 2022-10-12 DIAGNOSIS — J9601 Acute respiratory failure with hypoxia: Secondary | ICD-10-CM | POA: Diagnosis not present

## 2022-10-12 HISTORY — PX: RIGHT/LEFT HEART CATH AND CORONARY ANGIOGRAPHY: CATH118266

## 2022-10-12 LAB — POCT I-STAT EG7
Acid-Base Excess: 1 mmol/L (ref 0.0–2.0)
Acid-Base Excess: 2 mmol/L (ref 0.0–2.0)
Bicarbonate: 28 mmol/L (ref 20.0–28.0)
Bicarbonate: 28.6 mmol/L — ABNORMAL HIGH (ref 20.0–28.0)
Calcium, Ion: 1.04 mmol/L — ABNORMAL LOW (ref 1.15–1.40)
Calcium, Ion: 1.08 mmol/L — ABNORMAL LOW (ref 1.15–1.40)
HCT: 33 % — ABNORMAL LOW (ref 36.0–46.0)
HCT: 33 % — ABNORMAL LOW (ref 36.0–46.0)
Hemoglobin: 11.2 g/dL — ABNORMAL LOW (ref 12.0–15.0)
Hemoglobin: 11.2 g/dL — ABNORMAL LOW (ref 12.0–15.0)
O2 Saturation: 77 %
O2 Saturation: 80 %
Potassium: 3.9 mmol/L (ref 3.5–5.1)
Potassium: 3.9 mmol/L (ref 3.5–5.1)
Sodium: 135 mmol/L (ref 135–145)
Sodium: 135 mmol/L (ref 135–145)
TCO2: 30 mmol/L (ref 22–32)
TCO2: 30 mmol/L (ref 22–32)
pCO2, Ven: 54.6 mmHg (ref 44–60)
pCO2, Ven: 55.3 mmHg (ref 44–60)
pH, Ven: 7.318 (ref 7.25–7.43)
pH, Ven: 7.321 (ref 7.25–7.43)
pO2, Ven: 46 mmHg — ABNORMAL HIGH (ref 32–45)
pO2, Ven: 49 mmHg — ABNORMAL HIGH (ref 32–45)

## 2022-10-12 LAB — COMPREHENSIVE METABOLIC PANEL
ALT: 171 U/L — ABNORMAL HIGH (ref 0–44)
AST: 186 U/L — ABNORMAL HIGH (ref 15–41)
Albumin: 1.9 g/dL — ABNORMAL LOW (ref 3.5–5.0)
Alkaline Phosphatase: 132 U/L — ABNORMAL HIGH (ref 38–126)
Anion gap: 15 (ref 5–15)
BUN: 36 mg/dL — ABNORMAL HIGH (ref 8–23)
CO2: 25 mmol/L (ref 22–32)
Calcium: 8.3 mg/dL — ABNORMAL LOW (ref 8.9–10.3)
Chloride: 93 mmol/L — ABNORMAL LOW (ref 98–111)
Creatinine, Ser: 3.87 mg/dL — ABNORMAL HIGH (ref 0.44–1.00)
GFR, Estimated: 12 mL/min — ABNORMAL LOW (ref >60)
Glucose, Bld: 129 mg/dL — ABNORMAL HIGH (ref 70–99)
Potassium: 3.6 mmol/L (ref 3.5–5.1)
Sodium: 133 mmol/L — ABNORMAL LOW (ref 135–145)
Total Bilirubin: 12 mg/dL — ABNORMAL HIGH (ref 0.3–1.2)
Total Protein: 5.9 g/dL — ABNORMAL LOW (ref 6.5–8.1)

## 2022-10-12 LAB — CBC
HCT: 29.2 % — ABNORMAL LOW (ref 36.0–46.0)
Hemoglobin: 9.4 g/dL — ABNORMAL LOW (ref 12.0–15.0)
MCH: 29.1 pg (ref 26.0–34.0)
MCHC: 32.2 g/dL (ref 30.0–36.0)
MCV: 90.4 fL (ref 80.0–100.0)
Platelets: 96 10*3/uL — ABNORMAL LOW (ref 150–400)
RBC: 3.23 MIL/uL — ABNORMAL LOW (ref 3.87–5.11)
RDW: 27 % — ABNORMAL HIGH (ref 11.5–15.5)
WBC: 15.6 10*3/uL — ABNORMAL HIGH (ref 4.0–10.5)
nRBC: 4.2 % — ABNORMAL HIGH (ref 0.0–0.2)

## 2022-10-12 LAB — GLUCOSE, CAPILLARY
Glucose-Capillary: 112 mg/dL — ABNORMAL HIGH (ref 70–99)
Glucose-Capillary: 116 mg/dL — ABNORMAL HIGH (ref 70–99)
Glucose-Capillary: 117 mg/dL — ABNORMAL HIGH (ref 70–99)
Glucose-Capillary: 137 mg/dL — ABNORMAL HIGH (ref 70–99)
Glucose-Capillary: 140 mg/dL — ABNORMAL HIGH (ref 70–99)
Glucose-Capillary: 151 mg/dL — ABNORMAL HIGH (ref 70–99)

## 2022-10-12 LAB — APTT: aPTT: 77 seconds — ABNORMAL HIGH (ref 24–36)

## 2022-10-12 SURGERY — RIGHT/LEFT HEART CATH AND CORONARY ANGIOGRAPHY
Anesthesia: LOCAL

## 2022-10-12 MED ORDER — ATORVASTATIN CALCIUM 40 MG PO TABS: 40.0000 mg | ORAL_TABLET | Freq: Every day | ORAL | Status: DC

## 2022-10-12 MED ORDER — MIDODRINE HCL 5 MG PO TABS
5.0000 mg | ORAL_TABLET | ORAL | Status: AC
  Administered 2022-10-12: 5 mg
  Filled 2022-10-12: qty 1

## 2022-10-12 MED ORDER — MIDAZOLAM HCL 2 MG/2ML IJ SOLN
INTRAMUSCULAR | Status: DC | PRN
  Administered 2022-10-12: .5 mg via INTRAVENOUS

## 2022-10-12 MED ORDER — NOREPINEPHRINE 4 MG/250ML-% IV SOLN
2.0000 ug/min | INTRAVENOUS | Status: DC
  Administered 2022-10-12: 2 ug/min via INTRAVENOUS
  Administered 2022-10-12: 6 ug/min via INTRAVENOUS
  Administered 2022-10-13: 8 ug/min via INTRAVENOUS
  Administered 2022-10-13: 7 ug/min via INTRAVENOUS
  Administered 2022-10-14: 10 ug/min via INTRAVENOUS
  Filled 2022-10-12 (×4): qty 250

## 2022-10-12 MED ORDER — LIDOCAINE HCL (PF) 1 % IJ SOLN
INTRAMUSCULAR | Status: AC
  Filled 2022-10-12: qty 30

## 2022-10-12 MED ORDER — HEPARIN (PORCINE) 25000 UT/250ML-% IV SOLN
600.0000 [IU]/h | INTRAVENOUS | Status: DC
  Administered 2022-10-13: 400 [IU]/h via INTRAVENOUS
  Filled 2022-10-12: qty 250

## 2022-10-12 MED ORDER — MIDODRINE HCL 5 MG PO TABS
5.0000 mg | ORAL_TABLET | Freq: Three times a day (TID) | ORAL | Status: DC
  Administered 2022-10-12: 5 mg
  Filled 2022-10-12: qty 1

## 2022-10-12 MED ORDER — HEPARIN (PORCINE) IN NACL 1000-0.9 UT/500ML-% IV SOLN
INTRAVENOUS | Status: DC | PRN
  Administered 2022-10-12 (×2): 500 mL

## 2022-10-12 MED ORDER — MIDODRINE HCL 5 MG PO TABS: 5.0000 mg | ORAL_TABLET | Freq: Three times a day (TID) | ORAL | Status: DC

## 2022-10-12 MED ORDER — MIDAZOLAM HCL 2 MG/2ML IJ SOLN
INTRAMUSCULAR | Status: AC
  Filled 2022-10-12: qty 2

## 2022-10-12 MED ORDER — IOHEXOL 350 MG/ML SOLN
INTRAVENOUS | Status: DC | PRN
  Administered 2022-10-12: 15 mL

## 2022-10-12 MED ORDER — LIDOCAINE HCL (PF) 1 % IJ SOLN
INTRAMUSCULAR | Status: DC | PRN
  Administered 2022-10-12: 10 mL

## 2022-10-12 MED ORDER — IPRATROPIUM-ALBUTEROL 0.5-2.5 (3) MG/3ML IN SOLN
3.0000 mL | Freq: Four times a day (QID) | RESPIRATORY_TRACT | Status: DC | PRN
  Administered 2022-10-13 – 2022-10-14 (×2): 3 mL via RESPIRATORY_TRACT
  Filled 2022-10-12 (×2): qty 3

## 2022-10-12 MED ORDER — OXYCODONE HCL 5 MG PO TABS
5.0000 mg | ORAL_TABLET | Freq: Four times a day (QID) | ORAL | Status: DC
  Administered 2022-10-12 – 2022-10-14 (×7): 5 mg
  Filled 2022-10-12 (×7): qty 1

## 2022-10-12 MED ORDER — MIDODRINE HCL 5 MG PO TABS
10.0000 mg | ORAL_TABLET | Freq: Three times a day (TID) | ORAL | Status: DC
  Administered 2022-10-12 – 2022-10-13 (×3): 10 mg
  Filled 2022-10-12 (×3): qty 2

## 2022-10-12 MED ORDER — ASPIRIN 81 MG PO CHEW
81.0000 mg | CHEWABLE_TABLET | Freq: Every day | ORAL | Status: DC
  Administered 2022-10-13: 81 mg
  Filled 2022-10-12: qty 1

## 2022-10-12 MED ORDER — ATORVASTATIN CALCIUM 40 MG PO TABS
40.0000 mg | ORAL_TABLET | Freq: Every day | ORAL | Status: DC
  Administered 2022-10-12 – 2022-10-14 (×3): 40 mg
  Filled 2022-10-12 (×3): qty 1

## 2022-10-12 MED ORDER — FENTANYL CITRATE (PF) 100 MCG/2ML IJ SOLN
INTRAMUSCULAR | Status: AC
  Filled 2022-10-12: qty 2

## 2022-10-12 MED ORDER — FENTANYL CITRATE (PF) 100 MCG/2ML IJ SOLN
INTRAMUSCULAR | Status: DC | PRN
  Administered 2022-10-12: 25 ug via INTRAVENOUS

## 2022-10-12 SURGICAL SUPPLY — 12 items
CATH INFINITI 5FR MULTPACK ANG (CATHETERS) IMPLANT
CATH SWAN GANZ 7F STRAIGHT (CATHETERS) IMPLANT
CLOSURE MYNX CONTROL 5F (Vascular Products) IMPLANT
KIT HEART LEFT (KITS) ×1 IMPLANT
KIT MICROPUNCTURE NIT STIFF (SHEATH) IMPLANT
PACK CARDIAC CATHETERIZATION (CUSTOM PROCEDURE TRAY) ×1 IMPLANT
SHEATH PINNACLE 5F 10CM (SHEATH) IMPLANT
SHEATH PINNACLE 7F 10CM (SHEATH) IMPLANT
SHEATH PROBE COVER 6X72 (BAG) IMPLANT
TRANSDUCER W/STOPCOCK (MISCELLANEOUS) ×1 IMPLANT
WIRE EMERALD 3MM-J .025X260CM (WIRE) IMPLANT
WIRE EMERALD 3MM-J .035X150CM (WIRE) IMPLANT

## 2022-10-12 NOTE — Progress Notes (Signed)
SLP Cancellation Note  Patient Details Name: Morgan Clay MRN: [id number] DOB: 11-06-1945   Cancelled treatment:       Reason Eval/Treat Not Completed: Other (comment). Patient on vent, has Cortrak in place, planned right/left heart cath today. SLP will follow for readiness.  Angela Nevin, MA, CCC-SLP Speech Therapy

## 2022-10-12 NOTE — Progress Notes (Signed)
ANTICOAGULATION CONSULT NOTE  Pharmacy Consult for IV Heparin Indication: chest pain/ACS  No Known Allergies  Patient Measurements: Height: 4' 11.02" (149.9 cm) Weight: 60 kg (132 lb 4.4 oz) IBW/kg (Calculated) : 43.24 Heparin Dosing Weight: 54.8 kg  Vital Signs: Temp: 97.5 F (36.4 C) (06/06 1536) Temp Source: Oral (06/06 1536) BP: 91/39 (06/06 1254) Pulse Rate: 70 (06/06 1254)  Labs: Recent Labs    10/10/22 0422 10/10/22 0509 10/11/22 0236 10/12/22 0234  HGB 10.5* 12.6 10.6* 9.4*  HCT 31.5* 37.0 32.6* 29.2*  PLT 67*  --  81* 96*  APTT 101*  --  74* 77*  CREATININE 4.55*  --  5.88* 3.87*  TROPONINIHS 4,805*  --   --   --     Estimated Creatinine Clearance: 10 mL/min (A) (by C-G formula based on SCr of 3.87 mg/dL (H)).    Assessment: 88 years age female s/p cardiac arrest with elevated troponin. Platelets noted to be low with dark stools overnight 6/3. Discussed with Dr. Celine Mans - starting IV heparin for ACS without bolus due to bleed risk.  Currently using aPTT to guide heparin dosing because elevated bilirubin could falsely elevate heparin levels.  aPTT therapeutic at 77 sec.  H/H trending down, plts improved to 96K.  No bleeding per discussion with RN.  Patient with history of Hep C - LFTs mildly elevated and bilirubin continues to trend up.  PM Update: Patient now post-cath to resume IV heparin 8 hours after sheath pull.  Femoral sheath- removed 15:04 PM. No hematoma. Plan to restart IV heparin at 23:30 PM at prior rate of 400 units/hr. aPTT in 8 hours.   Goal of Therapy:  Heparin level 0.3-0.7 units/ml Monitor platelets by anticoagulation protocol: Yes   Plan:  Restart heparin infusion at 400 units/hr at 23:30 PM.  aPTT in 8 hours.  Daily aPTT and CBC Monitor closely for s/sx of bleeding Follow-up plan to resume Apixaban  Link Snuffer, PharmD, BCPS, BCCCP Clinical Pharmacist Please refer to [facility] for [facility] numbers 10/12/2022, 3:37 PM

## 2022-10-12 NOTE — Progress Notes (Signed)
OT Cancellation Note  Patient Details Name: Morgan Clay MRN: [id number] DOB: 11/27/1945   Cancelled Treatment:    Reason Eval/Treat Not Completed: Patient at procedure or test/ unavailable. Pt at procedure for heart cath. Will return when pt medically appropriate and schedule allows.   Tyler Deis, OTR/L [facility] Office: [phone]   [name] 10/12/2022, 3:11 PM

## 2022-10-12 NOTE — Progress Notes (Addendum)
Advanced Heart Failure Rounding Note  PCP-Cardiologist: Marca Ancona, MD   Subjective:   Admitted 5/7 with acute respiratory failure and lactic acidosis.  5/31: PEA arrest>intubated 6/4: trach placed  Plan for [facility] today.   Drowsy in bed. Follows commands and nods to questions appropriately.   Objective:   Weight Range: 60 kg Body mass index is 26.72 kg/m.   Vital Signs:   Temp:  [97.5 F (36.4 C)-98.6 F (37 C)] 97.9 F (36.6 C) (06/06 0329) Pulse Rate:  [73-132] 75 (06/06 0600) Resp:  [4-35] 30 (06/06 0600) BP: (82-133)/(33-51) 97/33 (06/06 0600) SpO2:  [97 %-100 %] 99 % (06/06 0600) FiO2 (%):  [35 %-40 %] 35 % (06/06 0417) Weight:  [60 kg-60.4 kg] 60 kg (06/06 0208) Last BM Date : 10/10/22  Weight change: Filed Weights   10/11/22 0705 10/11/22 1101 10/12/22 0208  Weight: 61.6 kg 60.4 kg 60 kg    Intake/Output:   Intake/Output Summary (Last 24 hours) at 10/12/2022 0714 Last data filed at 10/12/2022 0600 Gross per 24 hour  Intake 865.2 ml  Output 1000 ml  Net -134.8 ml      Physical Exam    General:  ill appearing.   HEENT: trach, +cortack Neck: supple. JVD ~8 cm. Carotids 2+ bilat; no bruits. No lymphadenopathy or thyromegaly appreciated. Cor: PMI nondisplaced. Regular rate & rhythm. No rubs, gallops or murmurs. Lungs: bilateral ronchi Abdomen: soft, nontender, nondistended. No hepatosplenomegaly. No bruits or masses. Good bowel sounds. Extremities: no cyanosis, clubbing, rash, edema. + mitts Neuro: cranial nerves grossly intact. moves all 4 extremities w/o difficulty. Nods appropriately  Telemetry   NSR 70s (Personally reviewed)    EKG    No new EKG to review  Labs    CBC Recent Labs    10/11/22 0236 10/12/22 0234  WBC 15.8* 15.6*  HGB 10.6* 9.4*  HCT 32.6* 29.2*  MCV 89.3 90.4  PLT 81* 96*   Basic Metabolic Panel Recent Labs    16/10/96 0236 10/12/22 0234  NA 132* 133*  K 4.7 3.6  CL 91* 93*  CO2 21* 25  GLUCOSE 102*  129*  BUN 60* 36*  CREATININE 5.88* 3.87*  CALCIUM 8.8* 8.3*   Liver Function Tests Recent Labs    10/11/22 0236 10/12/22 0234  AST 207* 186*  ALT 229* 171*  ALKPHOS 123 132*  BILITOT 11.2* 12.0*  PROT 6.9 5.9*  ALBUMIN 2.2* 1.9*   No results for input(s): "LIPASE", "AMYLASE" in the last 72 hours. Cardiac Enzymes No results for input(s): "CKTOTAL", "CKMB", "CKMBINDEX", "TROPONINI" in the last 72 hours.  BNP: BNP (last 3 results) Recent Labs    03/27/22 1650 09/28/22 1834 09/30/22 1515  BNP 1,685.0* 1,552.2* 1,538.3*    ProBNP (last 3 results) Recent Labs    11/10/21 1151 11/17/21 1033 03/20/22 1159  PROBNP 1,656.0* 2,020.0* 948.0*     D-Dimer No results for input(s): "DDIMER" in the last 72 hours. Hemoglobin A1C No results for input(s): "HGBA1C" in the last 72 hours. Fasting Lipid Panel No results for input(s): "CHOL", "HDL", "LDLCALC", "TRIG", "CHOLHDL", "LDLDIRECT" in the last 72 hours. Thyroid Function Tests No results for input(s): "TSH", "T4TOTAL", "T3FREE", "THYROIDAB" in the last 72 hours.  Invalid input(s): "FREET3"  Other results:  Imaging    DG Abd Portable 1V  Result Date: 10/11/2022 CLINICAL DATA:  Feeding tube placement. EXAM: PORTABLE ABDOMEN - 1 VIEW COMPARISON:  10/07/2022 FINDINGS: Feeding tube tip is in the distal stomach with the tip directed towards  the pylorus. Visualized abdomen demonstrates nonspecific gas pattern. IMPRESSION: Feeding tube tip is in the distal stomach directed towards the pylorus. Electronically Signed   By: Kennith Center M.D.   On: 10/11/2022 10:43    Medications:    Scheduled Medications:  [START ON 10/13/2022] aspirin  81 mg Oral Daily   Chlorhexidine Gluconate Cloth  6 each Topical Q0600   Chlorhexidine Gluconate Cloth  6 each Topical Q0600   docusate  100 mg Per Tube BID   doxercalciferol  4 mcg Intravenous Q M,W,F-HD   feeding supplement (PROSource TF20)  60 mL Per Tube Daily   insulin aspart  0-15 Units  Subcutaneous Q4H   insulin glargine-yfgn  5 Units Subcutaneous Daily   ipratropium-albuterol  3 mL Nebulization Q6H   levothyroxine  125 mcg Per Tube Q0600   lidocaine  1 patch Transdermal Q24H   multivitamin  1 tablet Per Tube QHS   mouth rinse  15 mL Mouth Rinse Q2H   pantoprazole (PROTONIX) IV  40 mg Intravenous Q12H   polyethylene glycol  17 g Per Tube Daily   sodium chloride flush  3 mL Intravenous Q12H    Infusions:  sodium chloride Stopped (10/07/22 1027)   sodium chloride     sodium chloride     sodium chloride     cefTAZidime (FORTAZ)  IV Stopped (10/11/22 1839)   feeding supplement (VITAL 1.5 CAL) Stopped (10/12/22 0000)   heparin 400 Units/hr (10/12/22 0600)   norepinephrine (LEVOPHED) Adult infusion Stopped (10/12/22 0051)    PRN Medications: Place/Maintain arterial line **AND** sodium chloride, sodium chloride, docusate sodium, fentaNYL (SUBLIMAZE) injection, fentaNYL (SUBLIMAZE) injection, mineral oil-hydrophilic petrolatum, mouth rinse, oxyCODONE, sodium chloride flush  Patient Profile   77 y.o. with history of nonischemic cardiomyopathy with recovery of EF with BiV pacing, recurrent GI bleeding due to gastric AVMs, paroxysmal atrial flutter, cirrhosis s/p treatment with Harvoni for Hep C, aortic insuffiencey and ESRD on HD, admitted w/ acute hypoxic respiratory failure and lactic acidosis in the setting of rhinovirus pneumonitis. Viral panel + for rhinovirus/Enterovirus. Hospital course c/b PEA arrest and NSTEMI. Echo w/ drop in EF. Remains vent dependent, s/p trach. AHF team consulted for HF management.   Assessment/Plan   1. Acute Hypoxic Respiratory Failure - initially required NRB. Chest CT - for PE. Viral panel + for rhinovirus/Enterovirus - Initial resp failure felt 2/2 rhinovirus pneumonitis - Intubated on 5/31 post PEA arrest  - Extubated w/ trach placement on 6/4, remains on vent support FiO2 40%, PEEP 5  - per PCCM    2. PEA Arrest - 5/31, ~5 min ACLS  + Epi before ROSC   3. NSTEMI/ Known CAD  - LHC 2021 40% pLAD, 50% ost D1 - Hs trop peaked > 24,000, trending down 4,805  - Echo w/ drop in EF  - on heparin gtt  - LFTs down trending, will start statin today - plan LHC today    4. HFimEF>>HFrEF  - H/o NICM.  EF improved to 60-65% with MDT BiV pacing  - Echo 8/23 EF 50-55%, with moderate RV dilation, moderate AI  - Echo this admit EF 40-45%, RV mildly reduced (post PEA arrest)  - HS Trop > 24000. Plan [facility] tomorrow - volume managed by iHD, MWF. Plan session today    5. Rhinovirus/Enterovirus - supportive care, per CCM   6. Hypotension - levo off - wide pulse pressure - start midodrine 5 mg TID   7. ESRD - on HD MWF - nephrology following  8. Aortic Insuffiencey  - TEE 3/23 showed moderate to severe AI (PHT 413 msec and vena contracta 0.49 cm).  - Repeat echo 8/23 showed moderate AI.  - Echo this admit shows Mod-Severe AI  - ? If TAVR candidate   Length of Stay: 10  Alen Bleacher, NP  10/12/2022, 7:14 AM  Advanced Heart Failure Team Pager [phone] (M-F; 7a - 5p)  Please contact [facility] for night-coverage after hours (5p -7a ) and weekends on [email or web address]  Patient seen with NP, agree with the above note.   LHC/RHC today: Coronary Findings  Diagnostic Dominance: Left Left Anterior Descending  Mid LAD lesion is 30% stenosed.    Right Coronary Artery  Prox RCA lesion is 30% stenosed.    Intervention   No interventions have been documented.   Right Heart  Right Heart Pressures RHC Procedural Findings: Hemodynamics (mmHg) RA mean 9 RV 46/11 PA 48/16, mean 30 PCWP mean 12 LV 91/15 AO 84/28  Oxygen saturations: PA 78% AO 100%  Cardiac Output (Fick) 7.32  Cardiac Index (Fick) 4.73 PVR 2.5 WU   General: NAD Neck: Tracheostomy Lungs: Clear to auscultation bilaterally with normal respiratory effort. CV: Nondisplaced PMI.  Heart regular S1/S2, no S3/S4, 2/6 diastolic murmur.  No peripheral  edema.   Abdomen: Soft, nontender, no hepatosplenomegaly, no distention.  Skin: Intact without lesions or rashes.  Neurologic: Alert and oriented x 3.  Psych: Normal affect. Extremities: No clubbing or cyanosis.  HEENT: Normal.   1. Acute hypoxemic respiratory failure: CXR with bilateral airspace disease.  Suspect rhinovirus pneumonitis with superimposed secondary bacterial PNA with Pseudomonas.  Now with tracheostomy.   - Continue ceftazidime.  - Per CCM.  2. CAD: Prior cath in 2021 with nonbstructive disease. Patient had PEA arrest 5/31 thought to be due to hypercarbic respiratory failure.  HS-TnI post-arrest was >24000 suggesting NSTEMI. Echo showed mild LV dysfunction with EF 45%, mildly lower than prior (50-55% in 8/23). No chest pain currently.  Cath today actually showed no significant coronary disease.   - Continue heparin gtt.  - LFTs trending down, can restart her statin.  - Stop ASA, no significant coronary disease and she will be anticoagulated.  3. Acute on chronic HF with mid-range EF: History of NICM with recovery of EF after MDT CRT-D device was placed.  Echo in 8/23 with EF 50-55%.  Echo this admission showed EF 45% with moderate LVH, moderate RV dysfunction with moderate RV enlargement, D-shaped septum, severe TR, mod-severe AI. RHC today showed normal PCWP and mild pulmonary hypertension.  She does have evidence for possibly severe AI by echo and by wide pulse pressure.  - Off pressors.  - Volume management via HD.  - She had a somewhat equivocal PYP scan in the past, consider repeat in future to investigate for TTR cardiac amyloidosis.  Not candidate for cardiac MRI due to her device (not compatible with MRI).  4. Atrial fibrillation: Paroxysmal.  In NSR.  - Heparin gtt (Eliquis on hold).  - LFTs trending down, can start back home amiodarone when BP is more stable.  5. Aortic insufficiency: Moderate-severe by recent echoes.  Has wide pulse pressure.   - Consider TAVR in  future.  6. PEA arrest: 5/31, due to respiratory failure.  Treated with 5 minutes CPR and epinephrine.  Had rib fractures.  7. ESRD: MWF HD.   Marca Ancona 10/12/2022 3:16 PM

## 2022-10-12 NOTE — Progress Notes (Signed)
Patient ID: Morgan Clay, female   DOB: 04/14/1946, 77 y.o.   MRN: [id number] S: Able to UF 1 liter with HD yesterday.  For left and right heart cath today.  Midodrine increased to 10 mg tid due to drops in MAP.  O:BP (!) 97/33 (BP Location: Right Arm)   Pulse 75   Temp 98.4 F (36.9 C) (Oral)   Resp (!) 30   Ht 4' 11.02" (1.499 m)   Wt 60 kg   SpO2 99%   BMI 26.70 kg/m   Intake/Output Summary (Last 24 hours) at 10/12/2022 1004 Last data filed at 10/12/2022 0600 Gross per 24 hour  Intake 735.19 ml  Output 1000 ml  Net -264.81 ml   Intake/Output: I/O last 3 completed shifts: In: 2215.4 [I.V.:275.7; NG/GT:1360; IV Piggyback:579.7] Out: 1000 [Other:1000]  Intake/Output this shift:  No intake/output data recorded. Weight change: 0 kg Gen:on vent via trach CVS:RRR Resp: ventilated BS bilaterally Abd: +BS, soft, TN/ND Ext: 1+ edema of LUE, LUE AVF +T/B  Recent Labs  Lab 10/06/22 1735 10/07/22 0430 10/07/22 0710 10/07/22 1304 10/07/22 1755 10/08/22 0553 10/08/22 1644 10/09/22 0034 10/10/22 0422 10/10/22 0509 10/11/22 0236 10/12/22 0234  NA 139   < > 138  --   --  140  --  142 135 136 132* 133*  K 4.0   < > 5.2*  --   --  3.9  --  3.9 3.4* 3.2* 4.7 3.6  CL 90*  --  92*  --   --  97*  --  100 95*  --  91* 93*  CO2 19*  --  19*  --   --  23  --  22 22  --  21* 25  GLUCOSE 270*  --  167*  --   --  187*  --  162* 215*  --  102* 129*  BUN 27*  --  37*  --   --  64*  --  80* 43*  --  60* 36*  CREATININE 3.69*  --  4.65*  --   --  6.52*  --  7.50* 4.55*  --  5.88* 3.87*  ALBUMIN 3.2*  --  3.5  --   --  2.9*  --  2.6* 2.4*  --  2.2* 1.9*  CALCIUM 7.4*  --  8.7*  --   --  8.1*  --  7.6* 8.1*  --  8.8* 8.3*  PHOS 7.9*  --   --  6.9* 5.9* 6.1* 5.4*  --   --   --   --   --   AST 321*  --  366*  --   --  262*  --  220* 205*  --  207* 186*  ALT 515*  --  540*  --   --  370*  --  303* 249*  --  229* 171*   < > = values in this interval not displayed.   Liver Function  Tests: Recent Labs  Lab 10/10/22 0422 10/11/22 0236 10/12/22 0234  AST 205* 207* 186*  ALT 249* 229* 171*  ALKPHOS 107 123 132*  BILITOT 9.6* 11.2* 12.0*  PROT 6.8 6.9 5.9*  ALBUMIN 2.4* 2.2* 1.9*   No results for input(s): "LIPASE", "AMYLASE" in the last 168 hours. Recent Labs  Lab 10/06/22 0229  AMMONIA 25   CBC: Recent Labs  Lab 10/06/22 1735 10/07/22 0122 10/09/22 0034 10/09/22 1833 10/10/22 0422 10/10/22 0509 10/11/22 0236 10/12/22 0234  WBC 12.1*   < >  10.4 13.3* 14.9*  --  15.8* 15.6*  NEUTROABS 11.5*  --   --   --   --   --   --   --   HGB 11.4*   < > 10.3* 10.2* 10.5* 12.6 10.6* 9.4*  HCT 36.3   < > 33.1* 31.6* 31.5* 37.0 32.6* 29.2*  MCV 91.2   < > 91.4 89.0 87.5  --  89.3 90.4  PLT 94*   < > 57* 56* 67*  --  81* 96*   < > = values in this interval not displayed.   Cardiac Enzymes: No results for input(s): "CKTOTAL", "CKMB", "CKMBINDEX", "TROPONINI" in the last 168 hours. CBG: Recent Labs  Lab 10/11/22 1631 10/11/22 1954 10/11/22 2336 10/12/22 0328 10/12/22 0754  GLUCAP 139* 134* 127* 112* 137*    Iron Studies: No results for input(s): "IRON", "TIBC", "TRANSFERRIN", "FERRITIN" in the last 72 hours. Studies/Results: DG Abd Portable 1V  Result Date: 10/11/2022 CLINICAL DATA:  Feeding tube placement. EXAM: PORTABLE ABDOMEN - 1 VIEW COMPARISON:  10/07/2022 FINDINGS: Feeding tube tip is in the distal stomach with the tip directed towards the pylorus. Visualized abdomen demonstrates nonspecific gas pattern. IMPRESSION: Feeding tube tip is in the distal stomach directed towards the pylorus. Electronically Signed   By: Kennith Center M.D.   On: 10/11/2022 10:43   DG Chest Port 1 View  Result Date: 10/10/2022 CLINICAL DATA:  Status post tracheostomy EXAM: PORTABLE CHEST 1 VIEW COMPARISON:  Chest x-ray 10/09/2022 FINDINGS: There is a new tracheostomy with distal tip 3.7 cm above the carina. Left-sided ICD and right-sided central venous catheter are unchanged in  position. The heart is enlarged. Bilateral mid and lower lung airspace opacities persist. There is no pleural effusion or pneumothorax. The osseous structures are stable. IMPRESSION: 1. New tracheostomy with distal tip 3.7 cm above the carina. 2. Persistent bilateral mid and lower lung airspace opacities. Electronically Signed   By: Darliss Cheney M.D.   On: 10/10/2022 16:04    [START ON 10/13/2022] aspirin  81 mg Per Tube Daily   atorvastatin  40 mg Per Tube Daily   Chlorhexidine Gluconate Cloth  6 each Topical Q0600   Chlorhexidine Gluconate Cloth  6 each Topical Q0600   docusate  100 mg Per Tube BID   doxercalciferol  4 mcg Intravenous Q M,W,F-HD   feeding supplement (PROSource TF20)  60 mL Per Tube Daily   insulin aspart  0-15 Units Subcutaneous Q4H   insulin glargine-yfgn  5 Units Subcutaneous Daily   levothyroxine  125 mcg Per Tube Q0600   lidocaine  1 patch Transdermal Q24H   midodrine  10 mg Per Tube Q8H   multivitamin  1 tablet Per Tube QHS   mouth rinse  15 mL Mouth Rinse Q2H   oxyCODONE  5 mg Per Tube Q6H   pantoprazole (PROTONIX) IV  40 mg Intravenous Q12H   polyethylene glycol  17 g Per Tube Daily   sodium chloride flush  3 mL Intravenous Q12H    BMET    Component Value Date/Time   NA 133 (L) 10/12/2022 0234   NA 140 12/25/2018 1514   K 3.6 10/12/2022 0234   CL 93 (L) 10/12/2022 0234   CO2 25 10/12/2022 0234   GLUCOSE 129 (H) 10/12/2022 0234   BUN 36 (H) 10/12/2022 0234   BUN 33 (H) 12/25/2018 1514   CREATININE 3.87 (H) 10/12/2022 0234   CREATININE 2.24 (H) 02/04/2020 1416   CALCIUM 8.3 (L) 10/12/2022 0234  GFRNONAA 12 (L) 10/12/2022 0234   GFRNONAA 21 (L) 02/04/2020 1416   GFRAA 25 (L) 02/04/2020 1416   CBC    Component Value Date/Time   WBC 15.6 (H) 10/12/2022 0234   RBC 3.23 (L) 10/12/2022 0234   HGB 9.4 (L) 10/12/2022 0234   HGB 11.0 (L) 06/19/2019 1010   HGB 8.4 (L) 10/04/2017 1534   HCT 29.2 (L) 10/12/2022 0234   HCT 26.1 (L) 10/04/2017 1534   PLT 96  (L) 10/12/2022 0234   PLT 192 06/19/2019 1010   PLT 201 10/04/2017 1534   MCV 90.4 10/12/2022 0234   MCV 87 10/04/2017 1534   MCH 29.1 10/12/2022 0234   MCHC 32.2 10/12/2022 0234   RDW 27.0 (H) 10/12/2022 0234   RDW 17.3 (H) 10/04/2017 1534   LYMPHSABS 0.4 (L) 10/06/2022 1735   LYMPHSABS 2.1 10/04/2017 1534   MONOABS 0.1 10/06/2022 1735   EOSABS 0.0 10/06/2022 1735   EOSABS 0.2 10/04/2017 1534   BASOSABS 0.0 10/06/2022 1735   BASOSABS 0.0 10/04/2017 1534    OP HD: SW MWF 3.5h  400/600   58.5kjg  2/2 bath  RIJ TDC  Heparin none - last HD 5/27, post wt 57.9kg - sensipar 30mg  po tiw - hectorol 4 mcg IV tiw     Assessment/ Plan: Hypoxemic resp failure/ multifocal pna - was on 12 L HFNC initially then seemed to be improving but had code blue yest w/ CPR and intubation and ROSC at 5 min. F/u CXR showed increased bilat patchy opacities, likely multifocal infection/ inflammation. Bronch showed pus/ tenacious secretions. Is on IV abx per CCM. Extubated then reintubated same day 10/08/22.  S/p trach on 10/10/22 per PCCM. Circulatory shock s/p cardiac arrest with known pulm HTN - for left and right heart cath today.  Currently on levophed gtt and midodrine 10 mg tid. ID - pna as above, also resp panel + for rhinovirus/ enterovirus Abd pain -  seen by gen surg, repeat abd CT negative. Surg signed off.  ESRD - on HD MWF. Last HD Monday. Next HD tomorrow.  HTN/ volume - await RHC report if we need to be more aggressive with UF. Anemia esrd - Hb > 10 here here, no esa needs MBD ckd - CCa and phos in range. Cont po sensipar and IV vdra w/ HD.  H/o pHTN H/o atrial fib Vascular access - has LUE AVF in place as well as RIJ TDC.  AVF temp  unusable due to severe pain in left arm.  Continue with [facility] for now.   Irena Cords, MD [facility]

## 2022-10-12 NOTE — Progress Notes (Addendum)
ANTICOAGULATION CONSULT NOTE  Pharmacy Consult for IV Heparin Indication: chest pain/ACS  No Known Allergies  Patient Measurements: Height: 4' 11.02" (149.9 cm) Weight: 60 kg (132 lb 4.4 oz) IBW/kg (Calculated) : 43.24 Heparin Dosing Weight: 54.8 kg  Vital Signs: Temp: 98.4 F (36.9 C) (06/06 0757) Temp Source: Oral (06/06 0757) BP: 97/33 (06/06 0600) Pulse Rate: 75 (06/06 0600)  Labs: Recent Labs    10/09/22 1108 10/09/22 1833 10/10/22 0422 10/10/22 0509 10/11/22 0236 10/12/22 0234  HGB  --    < > 10.5* 12.6 10.6* 9.4*  HCT  --    < > 31.5* 37.0 32.6* 29.2*  PLT  --    < > 67*  --  81* 96*  APTT 51*   < > 101*  --  74* 77*  HEPARINUNFRC <0.10*  --   --   --   --   --   CREATININE  --   --  4.55*  --  5.88* 3.87*  TROPONINIHS  --   --  4,805*  --   --   --    < > = values in this interval not displayed.     Estimated Creatinine Clearance: 10 mL/min (A) (by C-G formula based on SCr of 3.87 mg/dL (H)).    Assessment: 23 years age female s/p cardiac arrest with elevated troponin. Platelets noted to be low with dark stools overnight 6/3. Discussed with Dr. Celine Mans - starting IV heparin for ACS without bolus due to bleed risk.  Currently using aPTT to guide heparin dosing because elevated bilirubin could falsely elevate heparin levels.  aPTT therapeutic at 77 sec.  H/H trending down, plts improved to 96K.  No bleeding per discussion with RN.  Patient with history of Hep C - LFTs mildly elevated and bilirubin continues to trend up.  Goal of Therapy:  Heparin level 0.3-0.7 units/ml Monitor platelets by anticoagulation protocol: Yes   Plan:  Continue heparin infusion at 400 units/hr Daily aPTT and CBC Monitor closely for s/sx of bleeding F/u [facility] plans post cath  Scotti Kosta D. Laney Potash, PharmD, BCPS, BCCCP 10/12/2022, 8:20 AM

## 2022-10-12 NOTE — Interval H&P Note (Signed)
History and Physical Interval Note:  10/12/2022 2:28 PM  Morgan Clay  has presented today for surgery, with the diagnosis of PEA arrest.  The various methods of treatment have been discussed with the patient and family. After consideration of risks, benefits and other options for treatment, the patient has consented to  Procedure(s): RIGHT/LEFT HEART CATH AND CORONARY ANGIOGRAPHY (N/A) as a surgical intervention.  The patient's history has been reviewed, patient examined, no change in status, stable for surgery.  I have reviewed the patient's chart and labs.  Questions were answered to the patient's satisfaction.     Saarah Dewing [employer]

## 2022-10-12 NOTE — Progress Notes (Signed)
Nutrition Follow-up  DOCUMENTATION CODES:   Not applicable  INTERVENTION:  Continue tube feeding via Cortrak: Vital 1.5 at 40 mL/hr ( per day) 60 mL ProSource TF20 - Daily Provides 1520 kcal, 85 gm protein, 733 ml free water daily   Consider PEG tube placement for long term nutrition if diet unable to advance per ST follow up.   NUTRITION DIAGNOSIS:   Increased nutrient needs related to chronic illness as evidenced by estimated needs. - remains applicable  GOAL:   Patient will meet greater than or equal to 90% of their needs - goal met via TF  MONITOR:   TF tolerance  REASON FOR ASSESSMENT:   Consult, Ventilator Enteral/tube feeding initiation and management  ASSESSMENT:   77 y.o. female presented to the [facility] ED with shortness of breath. PMH includes CHF, ESRD on HD, cirrhosis with esophageal varices, COPD, gout, T2DM, and pulmonary HTN. Pt admitted with acute hypoxemic respiratory failure.  5/27 - Admitted 5/29 - transferred to floor 5/30 - diet advanced to clear liquids 5/31- found unresponsive following HD; PEA arrest; transferred to ICU and intubated; OGT tip/side port gastric 6/4 - trach placed 6/5 - Cortrak placed    Pt for [facility] today. TF held. Discussed resuming TF once pt able to elevate HOB following procedure.   Son-in-law present at bedside. No questions or concerns expressed at this time. Reports that she was eating well PTA with no significant changes.   Patient remains on ventilator support via trach MV: 10.2 L/min Temp (24hrs), Avg:98.2 F (36.8 C), Min:97.9 F (36.6 C), Max:98.6 F (37 C)  Last HD session (6/5) Net UF 1L Post HD weight 60.4 kg EDW 58.5 kg  Edema: non-pitting BUE/BLE; significant edema in LUE Discussed family concern of edema in LUE with RN.  Medications: colace, hectorol, SSI 0-15 units q4h, semglee 5 units daily, rena-vit, protonix, miralax IV drips: abx, heparin  Labs: sodium 133, BUN 36, Cr 3.87, alkaline phos  132, AST 186, ALT 171, tbili 12.0, GFR 12, CBG's 101-139 x24 hours  I/O's: - since admit  NUTRITION - FOCUSED PHYSICAL EXAM:  Flowsheet Row Most Recent Value  Orbital Region No depletion  Upper Arm Region No depletion  [LUE edema]  Thoracic and Lumbar Region No depletion  Buccal Region No depletion  Temple Region Mild depletion  Clavicle Bone Region Mild depletion  Clavicle and Acromion Bone Region Mild depletion  Scapular Bone Region No depletion  Dorsal Hand Unable to assess  [in mits]  Patellar Region Moderate depletion  Anterior Thigh Region Severe depletion  Posterior Calf Region Mild depletion  Edema (RD Assessment) Severe  [LUE]  Hair Reviewed  Eyes Unable to assess  Mouth Unable to assess  [dry, scab on left corner of lip]  Skin Reviewed  Nails Unable to assess  [in mits]      Diet Order:   Diet Order             Diet NPO time specified Except for: Sips with Meds  Diet effective midnight                   EDUCATION NEEDS:   Not appropriate for education at this time  Skin:  Skin Assessment: Reviewed RN Assessment  Last BM:  6/5 (type 6 medium)  Height:   Ht Readings from Last 1 Encounters:  10/12/22 4' 11.02" (1.499 m)    Weight:   Wt Readings from Last 1 Encounters:  10/12/22 60 kg   BMI:  Body mass  index is 26.7 kg/m.  Estimated Nutritional Needs:   Kcal:  1500-1700  Protein:  75-90g  Fluid:  1L + UOP  Drusilla Kanner, RDN, LDN Clinical Nutrition

## 2022-10-12 NOTE — Progress Notes (Signed)
NAME:  Morgan Clay, MRN:  [id number], DOB:  11/21/1945, LOS: 10 ADMISSION DATE:  10/02/2022, CONSULTATION DATE:  10/02/22 REFERRING MD:  Ernie Avena, MD CHIEF COMPLAINT:  Respiratory Failure  History of Present Illness:  Morgan Clay is a 77 year old woman, never smoker with history of heart failure, pulmonary hypertension, asthma, ESRD on HD, cirrhosis secondary to hepatitis C s/p Harvoni treatment, atrial fibrillation and esophageal varices/small bowel AVMs who presented to [facility] with shortness of breath after dialysis, she was tachypneic with an SpO2 on arrival in the ER.   She was noted to have Lactic acid of 7.7 and was given a bolus with repeat lactic acid 7.6. Due to increase work of breathing she was placed on non-rebreather mask. She was given duoneb and solumedrol 125mg  IV. CTA Chest was negative for PE, there was motion artifact but mosaic attenuation of the pulmonary parenchyma. CT Neck showed possible mild pharyngeal mucosal swelling, but again difficult due to motion artifact. Neck CT was done for neck swelling. CT abdomen done for abdominal pain with no acute findings.   PCCM consulted as patient remained tachypneic with increased work of breathing after transfer from non-rebreather to HFNC.   Patient accepted in transfer from [facility] to [facility] ICU.  Pertinent  Medical History   Asthma Pulmonary Hypertension OSA Chronic Systolic Heart Failure Atrial Fibrillation DMII ESRD on HD Cirrhosis Hepatitis C s/p Harvoni Esophageal Varices Duodenal AVM  Significant Hospital Events: Including procedures, antibiotic start and stop dates in addition to other pertinent events   5/27 presented to [facility], transferred to [facility] 5/28 remains on 12L, stable, viral panel + for rhinovirus/Enterovirus  5/29 lactic improving, surgery following serial abdominal exams, down to 4L [place].  5/30 down to room air. Initial resp failure  felt 2/2 rhinovirus pneumonitis. Lactate cleared felt 2/2 generalized multiorgan failure which was slow to clear d/t cirrhosis. Remained encephalopathic  5/31 surgery cleared for diet and signed off. Went to [employer]. Completed HD was found by family unresponsive. Not initially on tele. Staff called to room. She had no pulse. Rhythm PEA. Underwent 5 minutes of ACLS 1 round epi before ROSC. Did not regain LOC so intubated and transferred to ICU. Copious oral secretions noted during intubation 6/2 extubated and required reintubation later the same day 6/4 tracheostomy placed  Interim History / Subjective:  No overnight issue.s pulling off 1L with dialysis, required low dose vasopressor for this.  Objective   Blood pressure (!) 97/33, pulse 75, temperature 98.4 F (36.9 C), temperature source Oral, resp. rate (!) 30, height 4' 11.02" (1.499 m), weight 60 kg, SpO2 99 %.    Vent Mode: PRVC FiO2 (%):  [35 %-40 %] 35 % Set Rate:  [30 bmp] 30 bmp Vt Set:  [350 mL] 350 mL PEEP:  [5 cmH20] 5 cmH20 Pressure Support:  [5 cmH20-12 cmH20] 5 cmH20 Plateau Pressure:  [20 cmH20-23 cmH20] 20 cmH20   Intake/Output Summary (Last 24 hours) at 10/12/2022 1047 Last data filed at 10/12/2022 0600 Gross per 24 hour  Intake 735.19 ml  Output 1000 ml  Net -264.81 ml   Filed Weights   10/11/22 0705 10/11/22 1101 10/12/22 0208  Weight: 61.6 kg 60.4 kg 60 kg   Gen:      Acutely and chronically ill HEENT:  trach to vent Lungs:    sounds of mechanical ventilation auscultated, no wheeze, 6.0 cuffed shiley to vent.  CV:  RRR Abd:      + bowel sounds; soft, non-tender; no palpable masses, no distension Ext:    No edema Skin:      Warm and dry; no rashes Neuro:  RASS 0 -1     Resolved Hospital Problem list     Lactic acidosis Hyperkalemia Traumatic pneumothorax  Assessment & Plan:   Acute hypoxic Respiratory Failure s/p PEA arrest c/b aspiration PNA and rib fractures 5/31.superimposed on recent  viral  pneumonia and asthma exacerbation and h/o OSA and PH.  Pseudomonas Pneumonia Mixed septic/cardiogenic shock Plan Sensitivities reviewed - will change to ceftazidime for total 7 days for pneumonia S/p tracheostomy On and off pressors. Will increase midodrine today to 10 mg TID. Given AI will aim for goal MAP>90 rather than a map goal.  Failed extubation likely due to pain from rib fractures from CPR. Will schedule oxycodone down tube to help with this and facilitate ventilator weaning.   Circulatory Shock s/p cardiac arrest w/ known Pulmonary Hypertension,  LVH, HFrEF right heart failure and aortic insuff. History of BiV-ICD with CRT.  Suspected Hypoxemia mediated PEA cardiac arrest in hospital 5/31. Time to ROSC 5 minutes.  NSTEMI Paroxysmal Atrial fib  Plan  - continue heparin gtt, eventually can transition back to DOAC after cath.  - cardiology consulted, plan for LHC today.   Anemia of Chronic Disease Melenontic stools - no recurrence Plan Cbc Transfuse for hgb < 7  Not requiring transfusions at this time. Continue bid ppi  Acute metabolic encephalopathy CT head was neg. Ammonia was nml  Clinically improved but at risk for delirium  Type 2 DM - resume tube feeds, SSI with tube feed coverage  Abdominal Pain Cirrhosis Elevated Liver Enzymes - initial CTA without acute pathology - Liver enzymes elevated in setting of hepatic congestion due to Right heart failure and hypoxemia TFs  ESRD on HD Plan Nephro following Inreasing midodrine today for chronic hypotension  Best Practice (right click and "Reselect all SmartList Selections" daily)   Diet/type: NPO w/ meds via tube DVT prophylaxis: SCD GI prophylaxis: N/A Lines: N/A Foley:  N/A Code Status:  full code Last date of multidisciplinary goals of care discussion [daughter updated at bedside 6/5]  Dispo - plan for [facility] later this week.  The patient is critically ill due to respiratory failure, cardiac arrest.   Critical care was necessary to treat or prevent imminent or life-threatening deterioration.  Critical care was time spent personally by me on the following activities: development of treatment plan with patient and/or surrogate as well as nursing, discussions with consultants, evaluation of patient's response to treatment, examination of patient, obtaining history from patient or surrogate, ordering and performing treatments and interventions, ordering and review of laboratory studies, ordering and review of radiographic studies, pulse oximetry, re-evaluation of patient's condition and participation in multidisciplinary rounds.   Critical Care Time devoted to patient care services described in this note is 33 minutes. This time reflects time of care of this signee Charlott Holler . This critical care time does not reflect separately billable procedures or procedure time, teaching time or supervisory time of PA/NP/Med student/Med Resident etc but could involve care discussion time.       Charlott Holler [facility] 10/12/2022 10:52 AM  Pager: see AMION  If no response to pager , please call critical care on call (see AMION) until 7pm After 7:00 pm call Elink

## 2022-10-12 NOTE — Progress Notes (Signed)
Pt was transported to Cath lab and back via ventilator with no apparent complications. 74M RT aware.

## 2022-10-13 ENCOUNTER — Encounter (HOSPITAL_COMMUNITY): Payer: Self-pay | Admitting: Cardiology

## 2022-10-13 DIAGNOSIS — E872 Acidosis, unspecified: Secondary | ICD-10-CM | POA: Diagnosis not present

## 2022-10-13 DIAGNOSIS — J9601 Acute respiratory failure with hypoxia: Secondary | ICD-10-CM | POA: Diagnosis not present

## 2022-10-13 DIAGNOSIS — I50811 Acute right heart failure: Secondary | ICD-10-CM | POA: Diagnosis not present

## 2022-10-13 LAB — CBC
HCT: 32.3 % — ABNORMAL LOW (ref 36.0–46.0)
HCT: 29.6 % — ABNORMAL LOW (ref 36.0–46.0)
Hemoglobin: 10.6 g/dL — ABNORMAL LOW (ref 12.0–15.0)
Hemoglobin: 9.9 g/dL — ABNORMAL LOW (ref 12.0–15.0)
MCH: 30.4 pg (ref 26.0–34.0)
MCH: 30.3 pg (ref 26.0–34.0)
MCHC: 32.8 g/dL (ref 30.0–36.0)
MCHC: 33.4 g/dL (ref 30.0–36.0)
MCV: 92.6 fL (ref 80.0–100.0)
MCV: 90.5 fL (ref 80.0–100.0)
Platelets: 125 10*3/uL — ABNORMAL LOW (ref 150–400)
Platelets: 121 10*3/uL — ABNORMAL LOW (ref 150–400)
RBC: 3.49 MIL/uL — ABNORMAL LOW (ref 3.87–5.11)
RBC: 3.27 MIL/uL — ABNORMAL LOW (ref 3.87–5.11)
RDW: 27.9 % — ABNORMAL HIGH (ref 11.5–15.5)
RDW: 27.4 % — ABNORMAL HIGH (ref 11.5–15.5)
WBC: 17.9 10*3/uL — ABNORMAL HIGH (ref 4.0–10.5)
WBC: 16.4 10*3/uL — ABNORMAL HIGH (ref 4.0–10.5)
nRBC: 3.5 % — ABNORMAL HIGH (ref 0.0–0.2)
nRBC: 3.1 % — ABNORMAL HIGH (ref 0.0–0.2)

## 2022-10-13 LAB — HEPATIC FUNCTION PANEL
ALT: 159 U/L — ABNORMAL HIGH (ref 0–44)
AST: 209 U/L — ABNORMAL HIGH (ref 15–41)
Albumin: 2.6 g/dL — ABNORMAL LOW (ref 3.5–5.0)
Alkaline Phosphatase: 161 U/L — ABNORMAL HIGH (ref 38–126)
Bilirubin, Direct: 9.2 mg/dL — ABNORMAL HIGH (ref 0.0–0.2)
Indirect Bilirubin: 5.8 mg/dL — ABNORMAL HIGH (ref 0.3–0.9)
Total Bilirubin: 15 mg/dL — ABNORMAL HIGH (ref 0.3–1.2)
Total Protein: 7.8 g/dL (ref 6.5–8.1)

## 2022-10-13 LAB — PROTIME-INR
INR: 1.2 (ref 0.8–1.2)
Prothrombin Time: 15.6 seconds — ABNORMAL HIGH (ref 11.4–15.2)

## 2022-10-13 LAB — RENAL FUNCTION PANEL
Albumin: 1.8 g/dL — ABNORMAL LOW (ref 3.5–5.0)
Anion gap: 19 — ABNORMAL HIGH (ref 5–15)
BUN: 53 mg/dL — ABNORMAL HIGH (ref 8–23)
CO2: 22 mmol/L (ref 22–32)
Calcium: 8.6 mg/dL — ABNORMAL LOW (ref 8.9–10.3)
Chloride: 91 mmol/L — ABNORMAL LOW (ref 98–111)
Creatinine, Ser: 5.33 mg/dL — ABNORMAL HIGH (ref 0.44–1.00)
GFR, Estimated: 8 mL/min — ABNORMAL LOW (ref >60)
Glucose, Bld: 163 mg/dL — ABNORMAL HIGH (ref 70–99)
Phosphorus: 3.5 mg/dL (ref 2.5–4.6)
Potassium: 4.4 mmol/L (ref 3.5–5.1)
Sodium: 132 mmol/L — ABNORMAL LOW (ref 135–145)

## 2022-10-13 LAB — GLUCOSE, CAPILLARY
Glucose-Capillary: 118 mg/dL — ABNORMAL HIGH (ref 70–99)
Glucose-Capillary: 159 mg/dL — ABNORMAL HIGH (ref 70–99)
Glucose-Capillary: 130 mg/dL — ABNORMAL HIGH (ref 70–99)
Glucose-Capillary: 146 mg/dL — ABNORMAL HIGH (ref 70–99)
Glucose-Capillary: 174 mg/dL — ABNORMAL HIGH (ref 70–99)
Glucose-Capillary: 138 mg/dL — ABNORMAL HIGH (ref 70–99)

## 2022-10-13 LAB — APTT: aPTT: 59 seconds — ABNORMAL HIGH (ref 24–36)

## 2022-10-13 MED ORDER — HEPARIN SODIUM (PORCINE) 1000 UNIT/ML DIALYSIS
1000.0000 [IU] | INTRAMUSCULAR | Status: DC | PRN
  Administered 2022-10-13: 1000 [IU]
  Filled 2022-10-13 (×2): qty 1

## 2022-10-13 MED ORDER — ALTEPLASE 2 MG IJ SOLR: 2.0000 mg | Freq: Once | INTRAMUSCULAR | Status: DC | PRN

## 2022-10-13 MED ORDER — WHITE PETROLATUM EX OINT
TOPICAL_OINTMENT | CUTANEOUS | Status: DC | PRN
  Administered 2022-10-13: 0.2 via TOPICAL
  Filled 2022-10-13: qty 28.35

## 2022-10-13 MED ORDER — ALBUMIN HUMAN 25 % IV SOLN
25.0000 g | INTRAVENOUS | Status: AC | PRN
  Administered 2022-10-13 (×2): 25 g via INTRAVENOUS
  Filled 2022-10-13 (×2): qty 100

## 2022-10-13 MED ORDER — ONDANSETRON HCL 4 MG/2ML IJ SOLN
4.0000 mg | Freq: Four times a day (QID) | INTRAMUSCULAR | Status: DC | PRN
  Administered 2022-10-14 (×2): 4 mg via INTRAVENOUS
  Filled 2022-10-13 (×2): qty 2

## 2022-10-13 MED ORDER — ANTICOAGULANT SODIUM CITRATE 4% (200MG/5ML) IV SOLN: 5.0000 mL | Status: DC | PRN

## 2022-10-13 MED ORDER — APIXABAN 2.5 MG PO TABS
2.5000 mg | ORAL_TABLET | Freq: Two times a day (BID) | ORAL | Status: DC
  Administered 2022-10-13 – 2022-10-14 (×4): 2.5 mg
  Filled 2022-10-13 (×4): qty 1

## 2022-10-13 MED ORDER — MIDODRINE HCL 5 MG PO TABS
15.0000 mg | ORAL_TABLET | Freq: Three times a day (TID) | ORAL | Status: DC
  Administered 2022-10-13 – 2022-10-16 (×8): 15 mg
  Filled 2022-10-13 (×8): qty 3

## 2022-10-13 NOTE — Progress Notes (Signed)
eLink Physician-Brief Progress Note Patient Name: Morgan Clay DOB: [birth year] MRN: [id number]   Date of Service  10/13/2022  HPI/Events of Note  77 year old female with a history of cirrhosis, ESRD, A-fib, CAD, CAD who initially came in with rhinovirus/enterovirus associated respiratory failure and PEA arrest.  Patient is having emesis, tube feed colored.  Tube feeds have not been paused.  Current enteric tube is a core track.  eICU Interventions  Add Zofran  Obtain KUB.  If there is evidence of ileus, may need to insert NG tube for low intermittent suction   0027 -unable to administer NG meds while undergoing evaluation for ileus.  Appears uncomfortable, has renal failure.  Will add on Dilaudid as needed IV  0230 -during her bath, it was noticed that her right upper extremity more proximal IV had likely norepinephrine extravasation.  There is notable skin breakdown and erythema in the area.  Distal AV seems to be functioning appropriately and norepinephrine is infusing.  She has a left upper extremity AV graft and extremity restriction as a result.    We will initiate extravasation order set with nitroglycerin cream and phentolamine subcutaneous injections.  If ultrasound PIV cannot be established safely due to arm restrictions, will discuss the case with ground team for central line insertion.  Patient also had another bout of emesis through her naris and mouth.  Abdominal radiograph still pending-anticipate will need decompression.  Tube feeds held.  0327 - [name] PIV placed to replace the lost line, but tenuous. Will ask ground team to eval for a central line. Currently on 10 of levo.   0865 - patient becoming more lethargic, escalating dose of norepi to 15. Unclear whether new IV is working effectively. Will switch norepi to HD cath temporarily. Ground team at bedside to re-evaluate.  KUB with nonspecific pattern. Continue to hold, not suction for now.  Intervention  Category Minor Interventions: Clinical assessment - ordering diagnostic tests  [name] 10/13/2022, 11:34 PM

## 2022-10-13 NOTE — Progress Notes (Signed)
ANTICOAGULATION CONSULT NOTE  Pharmacy Consult for IV Heparin Indication: chest pain/ACS  No Known Allergies  Patient Measurements: Height: 4' 11.02" (149.9 cm) Weight: 60.2 kg (132 lb 11.5 oz) IBW/kg (Calculated) : 43.24 Heparin Dosing Weight: 54.8 kg  Vital Signs: Temp: 97.6 F (36.4 C) (06/07 1145) Temp Source: Oral (06/07 1145) BP: 117/32 (06/07 1245) Pulse Rate: 66 (06/07 1245)  Labs: Recent Labs    10/11/22 0236 10/21/2022 0234 10/29/2022 1449 10/13/22 0114 10/13/22 0651 10/13/22 0700 10/13/22 1013  HGB 10.6* 9.4* 11.2*  11.2* 10.6*  --  9.9*  --   HCT 32.6* 29.2* 33.0*  33.0* 32.3*  --  29.6*  --   PLT 81* 96*  --  125*  --  121*  --   APTT 74* 77*  --   --  59*  --   --   LABPROT  --   --   --   --   --   --  15.6*  INR  --   --   --   --   --   --  1.2  CREATININE 5.88* 3.87*  --   --   --  5.33*  --      Estimated Creatinine Clearance: 7.3 mL/min (A) (by C-G formula based on SCr of 5.33 mg/dL (H)).    Assessment: 29 years age female s/p cardiac arrest with elevated troponin. Platelets noted to be low with dark stools overnight 6/3. Discussed with Dr. Celine Mans - starting IV heparin for ACS without bolus due to bleed risk.  Currently using aPTT to guide heparin dosing because elevated bilirubin could falsely elevate heparin levels.  aPTT 59 - subtherapeutic on 400u/hr  Goal of Therapy:  Heparin level 0.3-0.7 units/ml Monitor platelets by anticoagulation protocol: Yes   Plan:  Inc heparin to 600 units/hr aPTT in 8 hours.  Daily aPTT and CBC Monitor closely for s/sx of bleeding Follow-up plan to resume Apixaban  Calton Dach, PharmD Clinical Pharmacist 10/13/2022 1:42 PM   ---  Transition back to home Eliquis 2.5 mg BID  F/u s/sx bleeding PRN and CBC

## 2022-10-13 NOTE — Progress Notes (Signed)
Physical Therapy Treatment Patient Details Name: Morgan Clay MRN: [id number] DOB: [birth year] Today's Date: 10/13/2022   History of Present Illness 77 yo female presents to [facility] on 10/05/22 with SOB and hypoxia s/p HD. viral panel + for rhinovirus/Enterovirus. Course complicated by PEA arrest 5/31,ROSC achieved after 5 min of CPR 1 round of Epi. pt was intubated and transferred to ICU.  Most likely due to acute hypoxic hypercarb respiratory failure.  Sm Rt sided pneumothorax.  s/p trach on 6/4. PMH: Liver cirrhosis, ESRD on HD MWF, Afib, hx of CVA, anemia, T2DM, HTN, ICD.    PT Comments    Pt admitted with above diagnosis. Pt performed UE and LE exercises in chair postion in bed. Pt shakes head to yes no questions. Progressing slowly and will need therapy < 2 hours day at d/c.  Pt currently with functional limitations due to the deficits listed below (see PT Problem List). Pt will benefit from acute skilled PT to increase their independence and safety with mobility to allow discharge.      Recommendations for follow up therapy are one component of a multi-disciplinary discharge planning process, led by the attending physician.  Recommendations may be updated based on patient status, additional functional criteria and insurance authorization.  Follow Up Recommendations       Assistance Recommended at Discharge Frequent or constant Supervision/Assistance  Patient can return home with the following Two people to help with walking and/or transfers;Two people to help with bathing/dressing/bathroom   Equipment Recommendations  Other (comment) (defer)    Recommendations for Other Services       Precautions / Restrictions Precautions Precautions: Fall;ICD/Pacemaker Precaution Comments: trach vent 35%FiO2/5 PEEP, cortrak, HD cath Restrictions Weight Bearing Restrictions: No     Mobility  Bed Mobility Overal bed mobility: Needs Assistance Bed Mobility: Rolling, Sidelying to Sit, Sit to  Sidelying Rolling: Max assist Sidelying to sit: Max assist, +2 for physical assistance       General bed mobility comments: Pt performed bed level treatment doing UE and LE exercises as pt requires +2 to get to EOB.  Inclined the bed to 60 degree chair position for exercises.    Transfers                   General transfer comment: unable to attempt this date    Ambulation/Gait                   Stairs             Wheelchair Mobility    Modified Rankin (Stroke Patients Only)       Balance                                            Cognition Arousal/Alertness: Lethargic Behavior During Therapy: Flat affect Overall Cognitive Status: Impaired/Different from baseline Area of Impairment: Orientation, Attention, Memory, Following commands, Safety/judgement, Awareness, Problem solving                 Orientation Level: Disoriented to, Time, Situation Current Attention Level: Focused   Following Commands: Follows one step commands with increased time Safety/Judgement: Decreased awareness of safety, Decreased awareness of deficits   Problem Solving: Difficulty sequencing, Requires verbal cues, Requires tactile cues, Slow processing General Comments: pt drowsy, slowed processing time this date. responds yes/no with head nods and opens eyes periodically during session, requires  constant stimulation to maintain engaged        Exercises General Exercises - Upper Extremity Shoulder Flexion: AAROM, Both, 5 reps, Supine Shoulder Extension: AROM, Both, 5 reps, Supine Elbow Flexion: AAROM, Both, 5 reps, Supine Elbow Extension: AAROM, Both, 5 reps, Supine General Exercises - Lower Extremity Ankle Circles/Pumps: AAROM, Both, 5 reps, Supine Heel Slides: AAROM, Both, 5 reps, Supine Hip ABduction/ADduction: AAROM, Both, 5 reps, Supine    General Comments General comments (skin integrity, edema, etc.): 35% FiO2, PEEP 5 71 bpm, BP  100./25 on arrival and 117/30 on departure with MAP 53.      Pertinent Vitals/Pain Pain Assessment Pain Assessment: Faces Faces Pain Scale: Hurts even more Breathing: normal Negative Vocalization: none Facial Expression: smiling or inexpressive Body Language: relaxed Consolability: no need to console PAINAD Score: 0 Pain Location: cortrak, back, LEs, trach site Pain Descriptors / Indicators: Sore, Discomfort, Guarding Pain Intervention(s): Limited activity within patient's tolerance, Monitored during session, Repositioned    Home Living                          Prior Function            PT Goals (current goals can now be found in the care plan section) Acute Rehab PT Goals Patient Stated Goal: back to independence Progress towards PT goals: Progressing toward goals    Frequency    Min 3X/week      PT Plan Discharge plan needs to be updated    Co-evaluation              AM-PAC PT "6 Clicks" Mobility   Outcome Measure  Help needed turning from your back to your side while in a flat bed without using bedrails?: A Lot Help needed moving from lying on your back to sitting on the side of a flat bed without using bedrails?: Total Help needed moving to and from a bed to a chair (including a wheelchair)?: Total Help needed standing up from a chair using your arms (e.g., wheelchair or bedside chair)?: Total Help needed to walk in hospital room?: Total Help needed climbing 3-5 steps with a railing? : Total 6 Click Score: 7    End of Session Equipment Utilized During Treatment: Other (comment) (vent trach) Activity Tolerance: Patient limited by fatigue;Patient limited by pain Patient left: in bed;with call bell/phone within reach;with bed alarm set Nurse Communication: Mobility status (had to suction x 1 by nurse) PT Visit Diagnosis: Other abnormalities of gait and mobility (R26.89);Muscle weakness (generalized) (M62.81)     Time: 7829-5621 PT Time  Calculation (min) (ACUTE ONLY): 22 min  Charges:  $Therapeutic Activity: 8-22 mins                     [facility] M,PT Acute Rehab Services [phone]    [name] 10/13/2022, 4:18 PM

## 2022-10-13 NOTE — Procedures (Signed)
I was present at this dialysis session. I have reviewed the session itself and made appropriate changes.  Back on levophed last night due to persistent hypotension.  Left and right heart cath with prox RCA of 30%, mid LAD of 30%, filling pressures not significantly elevated, mild pulmonary HTN, high cardiac output.  Vital signs in last 24 hours:  Temp:  [97.5 F (36.4 C)-98.3 F (36.8 C)] 97.8 F (36.6 C) (06/07 0745) Pulse Rate:  [60-94] 67 (06/07 0830) Resp:  [10-33] 25 (06/07 0830) BP: (69-151)/(21-52) 120/31 (06/07 0830) SpO2:  [97 %-100 %] 100 % (06/07 0840) FiO2 (%):  [35 %] 35 % (06/07 0840) Weight:  [62 kg-62.2 kg] 62.2 kg (06/07 0745) Weight change: 0.4 kg Filed Weights   10/09/2022 0208 10/13/22 0500 10/13/22 0745  Weight: 60 kg 62 kg 62.2 kg    Recent Labs  Lab 10/13/22 0700  NA 132*  K 4.4  CL 91*  CO2 22  GLUCOSE 163*  BUN 53*  CREATININE 5.33*  CALCIUM 8.6*  PHOS 3.5    Recent Labs  Lab 10/06/22 1735 10/07/22 0122 11/01/2022 0234 10/14/2022 1449 10/13/22 0114 10/13/22 0700  WBC 12.1*   < > 15.6*  --  17.9* 16.4*  NEUTROABS 11.5*  --   --   --   --   --   HGB 11.4*   < > 9.4* 11.2*  11.2* 10.6* 9.9*  HCT 36.3   < > 29.2* 33.0*  33.0* 32.3* 29.6*  MCV 91.2   < > 90.4  --  92.6 90.5  PLT 94*   < > 96*  --  125* 121*   < > = values in this interval not displayed.    Scheduled Meds:  aspirin  81 mg Per Tube Daily   atorvastatin  40 mg Per Tube Daily   Chlorhexidine Gluconate Cloth  6 each Topical Q0600   docusate  100 mg Per Tube BID   doxercalciferol  4 mcg Intravenous Q M,W,F-HD   feeding supplement (PROSource TF20)  60 mL Per Tube Daily   insulin aspart  0-15 Units Subcutaneous Q4H   insulin glargine-yfgn  5 Units Subcutaneous Daily   levothyroxine  125 mcg Per Tube Q0600   lidocaine  1 patch Transdermal Q24H   midodrine  10 mg Per Tube Q8H   multivitamin  1 tablet Per Tube QHS   mouth rinse  15 mL Mouth Rinse Q2H   oxyCODONE  5 mg Per Tube Q6H    pantoprazole (PROTONIX) IV  40 mg Intravenous Q12H   polyethylene glycol  17 g Per Tube Daily   sodium chloride flush  3 mL Intravenous Q12H   Continuous Infusions:  sodium chloride Stopped (10/07/22 1027)   sodium chloride     albumin human 25 g (10/13/22 0802)   cefTAZidime (FORTAZ)  IV Stopped (10/18/2022 1946)   feeding supplement (VITAL 1.5 CAL) 40 mL/hr at 10/13/22 0600   heparin 400 Units/hr (10/13/22 0600)   norepinephrine (LEVOPHED) Adult infusion 7 mcg/min (10/13/22 0600)   PRN Meds:.Place/Maintain arterial line **AND** sodium chloride, albumin human, docusate sodium, fentaNYL (SUBLIMAZE) injection, fentaNYL (SUBLIMAZE) injection, ipratropium-albuterol, mineral oil-hydrophilic petrolatum, oxyCODONE   Irena Cords,  MD 10/13/2022, 8:52 AM

## 2022-10-13 NOTE — Progress Notes (Addendum)
Advanced Heart Failure Rounding Note  PCP-Cardiologist: Marca Ancona, MD   Subjective:   Admitted 5/7 with acute respiratory failure and lactic acidosis.  5/31: PEA arrest>intubated 6/4: trach placed 6/6 : No coronary disease. RA 9, PA 48/19 (30) PCWP 12  PA 78% CO 7.3 CI 4.7 PVR 2.5   Back on Norepi over night. Norepi  10  mcg   Awake on dialysis.     Objective:   Weight Range: 62.2 kg Body mass index is 27.68 kg/m.   Vital Signs:   Temp:  [97.5 F (36.4 C)-98.3 F (36.8 C)] 97.8 F (36.6 C) (06/07 0745) Pulse Rate:  [0-79] 63 (06/07 1000) Resp:  [10-32] 14 (06/07 1000) BP: (69-120)/(20-44) 103/27 (06/07 1000) SpO2:  [97 %-100 %] 100 % (06/07 1000) FiO2 (%):  [35 %] 35 % (06/07 1000) Weight:  [62 kg-62.2 kg] 62.2 kg (06/07 0745) Last BM Date : 10/11/22  Weight change: Filed Weights   10/26/2022 0208 10/13/22 0500 10/13/22 0745  Weight: 60 kg 62 kg 62.2 kg    Intake/Output:   Intake/Output Summary (Last 24 hours) at 10/13/2022 1007 Last data filed at 10/13/2022 0600 Gross per 24 hour  Intake 952.34 ml  Output --  Net 952.34 ml      Physical Exam   General:  Vent through trach. On HD  HEENT: normal Neck: trach . no JVD. Carotids 2+ bilat; no bruits. No lymphadenopathy or thryomegaly appreciated. Cor: PMI nondisplaced. Regular rate & rhythm. No rubs, gallops or murmurs. Lungs: clear Abdomen: soft, nontender, nondistended. No hepatosplenomegaly. No bruits or masses. Good bowel sounds. Extremities: no cyanosis, clubbing, rash, edema Neuro: Awake on the vent MAE x4.   Telemetry  SR 60s personally checked.   EKG    No new EKG to review  Labs    CBC Recent Labs    10/13/22 0114 10/13/22 0700  WBC 17.9* 16.4*  HGB 10.6* 9.9*  HCT 32.3* 29.6*  MCV 92.6 90.5  PLT 125* 121*   Basic Metabolic Panel Recent Labs    62/95/28 0234 10/31/2022 1449 10/13/22 0700  NA 133* 135  135 132*  K 3.6 3.9  3.9 4.4  CL 93*  --  91*  CO2 25  --  22   GLUCOSE 129*  --  163*  BUN 36*  --  53*  CREATININE 3.87*  --  5.33*  CALCIUM 8.3*  --  8.6*  PHOS  --   --  3.5   Liver Function Tests Recent Labs    10/11/22 0236 10/13/2022 0234 10/13/22 0700  AST 207* 186*  --   ALT 229* 171*  --   ALKPHOS 123 132*  --   BILITOT 11.2* 12.0*  --   PROT 6.9 5.9*  --   ALBUMIN 2.2* 1.9* 1.8*   No results for input(s): "LIPASE", "AMYLASE" in the last 72 hours. Cardiac Enzymes No results for input(s): "CKTOTAL", "CKMB", "CKMBINDEX", "TROPONINI" in the last 72 hours.  BNP: BNP (last 3 results) Recent Labs    03/27/22 1650 09/28/22 1834 09/30/22 1515  BNP 1,685.0* 1,552.2* 1,538.3*    ProBNP (last 3 results) Recent Labs    11/10/21 1151 11/17/21 1033 03/20/22 1159  PROBNP 1,656.0* 2,020.0* 948.0*     D-Dimer No results for input(s): "DDIMER" in the last 72 hours. Hemoglobin A1C No results for input(s): "HGBA1C" in the last 72 hours. Fasting Lipid Panel No results for input(s): "CHOL", "HDL", "LDLCALC", "TRIG", "CHOLHDL", "LDLDIRECT" in the last 72 hours. Thyroid  Function Tests No results for input(s): "TSH", "T4TOTAL", "T3FREE", "THYROIDAB" in the last 72 hours.  Invalid input(s): "FREET3"  Other results:  Imaging    CARDIAC CATHETERIZATION  Result Date: 11/03/2022   Prox RCA lesion is 30% stenosed.   Mid LAD lesion is 30% stenosed. 1. Filling pressures not significantly elevated. 2. Mild pulmonary hypertension. 3. High cardiac output 4. No significant coronary disease.    Medications:    Scheduled Medications:  aspirin  81 mg Per Tube Daily   atorvastatin  40 mg Per Tube Daily   Chlorhexidine Gluconate Cloth  6 each Topical Q0600   docusate  100 mg Per Tube BID   doxercalciferol  4 mcg Intravenous Q M,W,F-HD   feeding supplement (PROSource TF20)  60 mL Per Tube Daily   insulin aspart  0-15 Units Subcutaneous Q4H   insulin glargine-yfgn  5 Units Subcutaneous Daily   levothyroxine  125 mcg Per Tube Q0600    lidocaine  1 patch Transdermal Q24H   midodrine  10 mg Per Tube Q8H   multivitamin  1 tablet Per Tube QHS   mouth rinse  15 mL Mouth Rinse Q2H   oxyCODONE  5 mg Per Tube Q6H   pantoprazole (PROTONIX) IV  40 mg Intravenous Q12H   polyethylene glycol  17 g Per Tube Daily   sodium chloride flush  3 mL Intravenous Q12H    Infusions:  sodium chloride Stopped (10/07/22 1027)   sodium chloride     albumin human 25 g (10/13/22 0802)   anticoagulant sodium citrate     cefTAZidime (FORTAZ)  IV Stopped (10/31/2022 1946)   feeding supplement (VITAL 1.5 CAL) 40 mL/hr at 10/13/22 0600   heparin 400 Units/hr (10/13/22 0600)   norepinephrine (LEVOPHED) Adult infusion 7 mcg/min (10/13/22 0600)    PRN Medications: Place/Maintain arterial line **AND** sodium chloride, albumin human, alteplase, anticoagulant sodium citrate, docusate sodium, heparin, ipratropium-albuterol, mineral oil-hydrophilic petrolatum, oxyCODONE, white petrolatum  Patient Profile   77 y.o. with history of nonischemic cardiomyopathy with recovery of EF with BiV pacing, recurrent GI bleeding due to gastric AVMs, paroxysmal atrial flutter, cirrhosis s/p treatment with Harvoni for Hep C, aortic insuffiencey and ESRD on HD, admitted w/ acute hypoxic respiratory failure and lactic acidosis in the setting of rhinovirus pneumonitis. Viral panel + for rhinovirus/Enterovirus. Hospital course c/b PEA arrest and NSTEMI. Echo w/ drop in EF. Remains vent dependent, s/p trach. AHF team consulted for HF management.   Assessment/Plan   1. Acute Hypoxic Respiratory Failure - initially required NRB. Chest CT - for PE. Viral panel + for rhinovirus/Enterovirus - Initial resp failure felt 2/2 rhinovirus pneumonitis - Intubated on 5/31 post PEA arrest  - Extubated w/ trach placement on 6/4, remains on vent support FiO2 35%  - per PCCM    2. PEA Arrest - 5/31, ~5 min ACLS + Epi before ROSC   3. NSTEMI/ Known CAD  - LHC 2021 40% pLAD, 50% ost D1 -  Hs trop peaked > 24,000, trending down 4,805  - Echo w/ drop in EF  - on heparin gtt  - LHC no significant coronary disease.  - On statin.    4. HFimEF>>HFrEF  - H/o NICM.  EF improved to 60-65% with MDT BiV pacing  - Echo 8/23 EF 50-55%, with moderate RV dilation, moderate AI  - Echo this admit EF 40-45%, RV mildly reduced (post PEA arrest)  - HS Trop > 24000. LHC no significant coronary disease.  - volume managed by iHD, MWF.  -  GDMT limited by hypotension.    5. Rhinovirus/Enterovirus - supportive care, per CCM   6. Hypotension - Back on norepi 10 mcg + midodrine 10 mg tid.    7. ESRD - on HD MWF - nephrology following    8. Aortic Insuffiencey  - TEE 3/23 showed moderate to severe AI (PHT 413 msec and vena contracta 0.49 cm).  - Repeat echo 8/23 showed moderate AI.  - Echo this admit shows Mod-Severe AI  - ? If TAVR candidate   Length of Stay: 19  [name] Clegg, NP  10/13/2022, 10:07 AM  Advanced Heart Failure Team Pager [phone] (M-F; 7a - 5p)  Please contact [facility] for night-coverage after hours (5p -7a ) and weekends on [email or web address]  Agree with above NP note.   Back on NE 7 with hypotension. RHC yesterday with optimized hemodynamics and no significant coronary disease.   General: NAD Neck: Tracheostomy, no thyromegaly or thyroid nodule.  Lungs: Clear to auscultation bilaterally with normal respiratory effort. CV: Nondisplaced PMI.  Heart regular S1/S2, no S3/S4, no murmur.  No peripheral edema.  No carotid bruit.  Normal pedal pulses.  Abdomen: Soft, nontender, no hepatosplenomegaly, no distention.  Skin: Intact without lesions or rashes.  Neurologic: Alert and oriented x 3.  Psych: Normal affect. Extremities: No clubbing or cyanosis.  HEENT: Normal.   1. Acute hypoxemic respiratory failure: CXR with bilateral airspace disease.  Suspect rhinovirus pneumonitis with superimposed secondary bacterial PNA with Pseudomonas.  Now with tracheostomy.   - Continue  ceftazidime.  - Per CCM.  2. CAD: Prior cath in 2021 with nonbstructive disease. Patient had PEA arrest 5/31 thought to be due to hypercarbic respiratory failure.  HS-TnI post-arrest was >24000 suggesting NSTEMI. Echo showed mild LV dysfunction with EF 45%, mildly lower than prior (50-55% in 8/23). No chest pain currently.  Cath yesterday actually showed no significant coronary disease.   - Continue heparin gtt.  - LFTs trending down, back on statin.  - Stopped ASA, no significant coronary disease and she will be anticoagulated.  3. Acute on chronic HF with mid-range EF: History of NICM with recovery of EF after MDT CRT-D device was placed.  Echo in 8/23 with EF 50-55%.  Echo this admission showed EF 45% with moderate LVH, moderate RV dysfunction with moderate RV enlargement, D-shaped septum, severe TR, mod-severe AI. RHC yesterday showed normal PCWP and mild pulmonary hypertension, preserved CO.  She does have evidence for possibly severe AI by echo and by wide pulse pressure.  Back on NE 7, ?distributive component to hypotension, also has wide pulse pressure from AI.  - Wean NE as able.  - Volume management via HD.  - She had a somewhat equivocal PYP scan in the past, consider repeat in future to investigate for TTR cardiac amyloidosis.  Not candidate for cardiac MRI due to her device (not compatible with MRI).  4. Atrial fibrillation: Paroxysmal.  In NSR.  - Heparin gtt (Eliquis on hold).  - LFTs trending down, can start back home amiodarone when BP is more stable.  5. Aortic insufficiency: Moderate-severe by recent echoes.  Has wide pulse pressure.   - Consider TAVR in future.  6. PEA arrest: 5/31, due to respiratory failure.  Treated with 5 minutes CPR and epinephrine.  Had rib fractures.  7. ESRD: MWF HD.   Marca Ancona 10/13/2022 12:09 PM

## 2022-10-13 NOTE — Progress Notes (Signed)
Post HD tx Note  10/13/22 1145  Vitals  Temp 97.6 F (36.4 C)  Temp Source Oral  BP (!) 109/43  MAP (mmHg) (!) 58  BP Location Right Leg (ankle)  BP Method Automatic  Patient Position (if appropriate) Lying  Pulse Rate 66  Pulse Rate Source Monitor  ECG Heart Rate 67  Resp (!) 23  Oxygen Therapy  SpO2 98 %  O2 Device Ventilator  FiO2 (%) 35 %  Pulse Oximetry Type Continuous  During Treatment Monitoring  Intra-Hemodialysis Comments (S)   (post HD tx VS check)  Post Treatment  Dialyzer Clearance Lightly streaked  Duration of HD Treatment -hour(s) 3.5 hour(s)  Hemodialysis Intake (mL) 200 mL (albumin)  Liters Processed 84  Fluid Removed (mL) 2000 mL ( (value from machine) - (albumin) = )  Tolerated HD Treatment Yes  Post-Hemodialysis Comments (S)  tx completed w/o problem, UF goal met, blood rinsed back, VSS. Medication Admin: Albumin 25% 25G IVPB x2, Hectorol IVP, Heparin Dwells 3800 units  Hemodialysis Catheter Right Subclavian Double lumen Permanent (Tunneled)  No placement date or time found.   Placed prior to admission: Yes  Orientation: Right  Access Location: Subclavian  Hemodialysis Catheter Type: Double lumen Permanent (Tunneled)  Site Condition No complications  Blue Lumen Status Heparin locked;Dead end cap in place  Red Lumen Status Heparin locked;Dead end cap in place  Purple Lumen Status N/A  Catheter fill solution Heparin 1000 units/ml  Catheter fill volume (Arterial) 1.9 cc  Catheter fill volume (Venous) 1.9  Dressing Type Transparent  Dressing Status Antimicrobial disc in place;Clean, Dry, Intact  Drainage Description None  Dressing Change Due 10/17/22  Post treatment catheter status Capped and Clamped

## 2022-10-13 NOTE — Progress Notes (Signed)
NAME:  Morgan Clay, MRN:  [id number], DOB:  26-Dec-1945, LOS: 11 ADMISSION DATE:  09/08/2022, CONSULTATION DATE:  10/04/2022 REFERRING MD:  Ernie Avena, MD CHIEF COMPLAINT:  Respiratory Failure  History of Present Illness:  Morgan Clay is a 77 year old woman, never smoker with history of heart failure, pulmonary hypertension, asthma, ESRD on HD, cirrhosis secondary to hepatitis C s/p Harvoni treatment, atrial fibrillation and esophageal varices/small bowel AVMs who presented to [facility] with shortness of breath after dialysis, she was tachypneic with an SpO2 on arrival in the ER.   She was noted to have Lactic acid of 7.7 and was given a bolus with repeat lactic acid 7.6. Due to increase work of breathing she was placed on non-rebreather mask. She was given duoneb and solumedrol 125mg  IV. CTA Chest was negative for PE, there was motion artifact but mosaic attenuation of the pulmonary parenchyma. CT Neck showed possible mild pharyngeal mucosal swelling, but again difficult due to motion artifact. Neck CT was done for neck swelling. CT abdomen done for abdominal pain with no acute findings.   PCCM consulted as patient remained tachypneic with increased work of breathing after transfer from non-rebreather to HFNC.   Patient accepted in transfer from [facility] to [facility] ICU.  Pertinent  Medical History   Asthma Pulmonary Hypertension OSA Chronic Systolic Heart Failure Atrial Fibrillation DMII ESRD on HD Cirrhosis Hepatitis C s/p Harvoni Esophageal Varices Duodenal AVM  Significant Hospital Events: Including procedures, antibiotic start and stop dates in addition to other pertinent events   5/27 presented to [facility], transferred to [facility] 5/28 remains on 12L, stable, viral panel + for rhinovirus/Enterovirus  5/29 lactic improving, surgery following serial abdominal exams, down to 4L .  5/30 down to room air. Initial resp failure  felt 2/2 rhinovirus pneumonitis. Lactate cleared felt 2/2 generalized multiorgan failure which was slow to clear d/t cirrhosis. Remained encephalopathic  5/31 surgery cleared for diet and signed off. Went to [employer] [facility]. Completed HD was found by family unresponsive. Not initially on tele. Staff called to room. She had no pulse. Rhythm PEA. Underwent 5 minutes of ACLS 1 round epi before ROSC. Did not regain LOC so intubated and transferred to ICU. Copious oral secretions noted during intubation 6/2 extubated and required reintubation later the same day 6/4 tracheostomy placed 6/6 LHC/RHC  No coronary disease. RA 9, PA 48/19 (30) PCWP 12 PA 78% CO 7.3 CI 4.7 PVR 2.5   Interim History / Subjective:   Examined on dialysis. Back on Levophed overnight and requiring increasing doses and dialysis  Objective   Blood pressure (!) 117/32, pulse 66, temperature 97.6 F (36.4 C), temperature source Oral, resp. rate 16, height 4' 11.02" (1.499 m), weight 60.2 kg, SpO2 99 %.    Vent Mode: CPAP;PSV FiO2 (%):  [35 %] 35 % Set Rate:  [30 bmp] 30 bmp Vt Set:  [350 mL] 350 mL PEEP:  [5 cmH20] 5 cmH20 Pressure Support:  [10 cmH20-12 cmH20] 10 cmH20 Plateau Pressure:  [19 cmH20-1919 cmH20] 20 cmH20   Intake/Output Summary (Last 24 hours) at 10/13/2022 1327 Last data filed at 10/13/2022 1200 Gross per 24 hour  Intake 1556.84 ml  Output 2000 ml  Net -443.16 ml    Filed Weights   10/13/22 0500 10/13/22 0745 10/13/22 1145  Weight: 62 kg 62.2 kg 60.2 kg   Gen:      Acutely and chronically ill, elderly woman, no distress HEENT:  trach to  vent Lungs:    Bilateral ventilated breath sounds, no accessory muscle use no wheeze, 6.0 cuffed shiley CV:         RRR Abd:      + bowel sounds; soft, non-tender; no palpable masses, no distension Ext:    No edema Skin:      Warm and dry; no rashes Neuro:  RASS 0 -1, follows one-step commands  Labs show mild hyponatremia, BUN 53, slightly elevated liver enzymes, bilirubin 5.8,  INR 1.2  Chest x-ray 6/4 shows tracheostomy in position, persistent bilateral airspace disease   Resolved Hospital Problem list     Lactic acidosis Hyperkalemia Traumatic pneumothorax  Assessment & Plan:   Acute hypoxic Respiratory Failure s/p PEA arrest c/b aspiration PNA and rib fractures 5/31.superimposed on recent  viral pneumonia and asthma exacerbation and h/o OSA and PH.  RVP positive for rhinovirus    Plan S/p tracheostomy Failed extubation likely due to pain from rib fractures from CPR. Will schedule oxycodone down tube to help with this and facilitate ventilator weaning.   Pseudomonas Pneumonia -pansensitive -ceftazidime for total 7 days  Mixed septic/cardiogenic shock s/p cardiac arrest w/ known Pulmonary Hypertension,  LVH, HFrEF right heart failure and aortic insuff. History of BiV-ICD with CRT.  Suspected Hypoxemia mediated PEA cardiac arrest in hospital 5/31. Time to ROSC 5 minutes.  NSTEMI Paroxysmal Atrial fib  Plan  On and off pressors. Will increase midodrine to 15 mg TID. Given AI will aim for goal MAP>90 rather than a map goal.  -Switched to Eliquis instead of IV heparin, monitor carefully in view of rising LFTs - cardiology following  Anemia of Chronic Disease Melenontic stools - no recurrence Plan Cbc Transfuse for hgb < 7  Continue bid ppi  Acute metabolic encephalopathy CT head was neg. Ammonia was nml  Clinically improved but at risk for delirium  Type 2 DM - resume tube feeds, SSI with tube feed coverage  Abdominal Pain Cirrhosis Elevated Liver Enzymes - initial CTA without acute pathology - Liver enzymes elevated in setting of hepatic congestion due to Right heart failure and hypoxemia TFs  ESRD on HD Plan Nephro following HD today  Best Practice (right click and "Reselect all SmartList Selections" daily)   Diet/type: NPO w/ meds via tube DVT prophylaxis: SCD GI prophylaxis: N/A Lines: N/A Foley:  N/A Code Status:  full  code Last date of multidisciplinary goals of care discussion [daughter updated at bedside 6/5]  Dispo - plan for [facility] later this week.  The patient is critically ill due to respiratory failure, cardiac arrest.  Critical care was necessary to treat or prevent imminent or life-threatening deterioration.  Critical care was time spent personally by me on the following activities: development of treatment plan with patient and/or surrogate as well as nursing, discussions with consultants, evaluation of patient's response to treatment, examination of patient, obtaining history from patient or surrogate, ordering and performing treatments and interventions, ordering and review of laboratory studies, ordering and review of radiographic studies, pulse oximetry, re-evaluation of patient's condition and participation in multidisciplinary rounds.   Critical Care Time devoted to patient care services described in this note is 34 minutes. This time reflects time of care of this signee Sharonne Ricketts V. Marthann Abshier . This critical care time does not reflect separately billable procedures or procedure time, teaching time or supervisory time of PA/NP/Med student/Med Resident etc but could involve care discussion time.       Comer Locket Lanny Cramp Pulmonary [facility] Critical Care Medicine  10/13/2022 1:27 PM  Pager: see AMION  If no response to pager , please call critical care on call (see AMION) until 7pm After 7:00 pm call Elink

## 2022-10-13 NOTE — Progress Notes (Signed)
SLP Cancellation Note  Patient Details Name: Morgan Clay MRN: [id number] DOB: May 22, 1945   Cancelled treatment:        Pt remains on vent. Will follow next week for ability to transition to trach collar or appropriateness for inline PMV    Royce Macadamia 10/13/2022, 7:21 AM

## 2022-10-14 ENCOUNTER — Inpatient Hospital Stay (HOSPITAL_COMMUNITY): Payer: 59

## 2022-10-14 DIAGNOSIS — I50811 Acute right heart failure: Secondary | ICD-10-CM | POA: Diagnosis not present

## 2022-10-14 DIAGNOSIS — Z992 Dependence on renal dialysis: Secondary | ICD-10-CM

## 2022-10-14 DIAGNOSIS — Z930 Tracheostomy status: Secondary | ICD-10-CM | POA: Diagnosis not present

## 2022-10-14 DIAGNOSIS — R579 Shock, unspecified: Secondary | ICD-10-CM

## 2022-10-14 DIAGNOSIS — E872 Acidosis, unspecified: Secondary | ICD-10-CM | POA: Diagnosis not present

## 2022-10-14 DIAGNOSIS — J9601 Acute respiratory failure with hypoxia: Secondary | ICD-10-CM

## 2022-10-14 DIAGNOSIS — N186 End stage renal disease: Secondary | ICD-10-CM

## 2022-10-14 LAB — CBC
HCT: 28.7 % — ABNORMAL LOW (ref 36.0–46.0)
Hemoglobin: 9.1 g/dL — ABNORMAL LOW (ref 12.0–15.0)
MCH: 29.5 pg (ref 26.0–34.0)
MCHC: 31.7 g/dL (ref 30.0–36.0)
MCV: 93.2 fL (ref 80.0–100.0)
Platelets: 133 10*3/uL — ABNORMAL LOW (ref 150–400)
RBC: 3.08 MIL/uL — ABNORMAL LOW (ref 3.87–5.11)
RDW: 28.9 % — ABNORMAL HIGH (ref 11.5–15.5)
WBC: 16.6 10*3/uL — ABNORMAL HIGH (ref 4.0–10.5)
nRBC: 8.6 % — ABNORMAL HIGH (ref 0.0–0.2)

## 2022-10-14 LAB — COMPREHENSIVE METABOLIC PANEL
ALT: 124 U/L — ABNORMAL HIGH (ref 0–44)
AST: 202 U/L — ABNORMAL HIGH (ref 15–41)
Albumin: 2.4 g/dL — ABNORMAL LOW (ref 3.5–5.0)
Alkaline Phosphatase: 145 U/L — ABNORMAL HIGH (ref 38–126)
Anion gap: 17 — ABNORMAL HIGH (ref 5–15)
BUN: 27 mg/dL — ABNORMAL HIGH (ref 8–23)
CO2: 24 mmol/L (ref 22–32)
Calcium: 8.8 mg/dL — ABNORMAL LOW (ref 8.9–10.3)
Chloride: 91 mmol/L — ABNORMAL LOW (ref 98–111)
Creatinine, Ser: 4.09 mg/dL — ABNORMAL HIGH (ref 0.44–1.00)
GFR, Estimated: 11 mL/min — ABNORMAL LOW (ref >60)
Glucose, Bld: 134 mg/dL — ABNORMAL HIGH (ref 70–99)
Potassium: 4.1 mmol/L (ref 3.5–5.1)
Sodium: 132 mmol/L — ABNORMAL LOW (ref 135–145)
Total Bilirubin: 14.6 mg/dL — ABNORMAL HIGH (ref 0.3–1.2)
Total Protein: 7.1 g/dL (ref 6.5–8.1)

## 2022-10-14 LAB — GLUCOSE, CAPILLARY
Glucose-Capillary: 83 mg/dL (ref 70–99)
Glucose-Capillary: 134 mg/dL — ABNORMAL HIGH (ref 70–99)
Glucose-Capillary: 126 mg/dL — ABNORMAL HIGH (ref 70–99)
Glucose-Capillary: 110 mg/dL — ABNORMAL HIGH (ref 70–99)
Glucose-Capillary: 109 mg/dL — ABNORMAL HIGH (ref 70–99)
Glucose-Capillary: 71 mg/dL (ref 70–99)

## 2022-10-14 LAB — BASIC METABOLIC PANEL
Anion gap: 17 — ABNORMAL HIGH (ref 5–15)
BUN: 26 mg/dL — ABNORMAL HIGH (ref 8–23)
CO2: 24 mmol/L (ref 22–32)
Calcium: 8.7 mg/dL — ABNORMAL LOW (ref 8.9–10.3)
Chloride: 93 mmol/L — ABNORMAL LOW (ref 98–111)
Creatinine, Ser: 3.74 mg/dL — ABNORMAL HIGH (ref 0.44–1.00)
GFR, Estimated: 12 mL/min — ABNORMAL LOW (ref >60)
Glucose, Bld: 134 mg/dL — ABNORMAL HIGH (ref 70–99)
Potassium: 3.8 mmol/L (ref 3.5–5.1)
Sodium: 134 mmol/L — ABNORMAL LOW (ref 135–145)

## 2022-10-14 MED ORDER — HEPARIN SODIUM (PORCINE) 1000 UNIT/ML IJ SOLN
1.9000 mL | Freq: Once | INTRAMUSCULAR | Status: AC
  Administered 2022-10-14: 1900 [IU] via INTRAVENOUS

## 2022-10-14 MED ORDER — OXYCODONE HCL 5 MG PO TABS
5.0000 mg | ORAL_TABLET | Freq: Two times a day (BID) | ORAL | Status: DC
  Administered 2022-10-14: 5 mg
  Filled 2022-10-14: qty 1

## 2022-10-14 MED ORDER — FENTANYL CITRATE PF 50 MCG/ML IJ SOSY
PREFILLED_SYRINGE | INTRAMUSCULAR | Status: AC
  Administered 2022-10-14: 50 ug via INTRAVENOUS
  Filled 2022-10-14: qty 1

## 2022-10-14 MED ORDER — DEXTROSE 50 % IV SOLN
INTRAVENOUS | Status: AC
  Administered 2022-10-15: 50 mL
  Filled 2022-10-14: qty 50

## 2022-10-14 MED ORDER — FENTANYL CITRATE PF 50 MCG/ML IJ SOSY: 50.0000 ug | PREFILLED_SYRINGE | Freq: Once | INTRAMUSCULAR | Status: AC

## 2022-10-14 MED ORDER — DARBEPOETIN ALFA 60 MCG/0.3ML IJ SOSY
60.0000 ug | PREFILLED_SYRINGE | INTRAMUSCULAR | Status: DC
  Administered 2022-10-14: 60 ug via SUBCUTANEOUS
  Filled 2022-10-14: qty 0.3

## 2022-10-14 MED ORDER — NOREPINEPHRINE 16 MG/250ML-% IV SOLN
0.0000 ug/min | INTRAVENOUS | Status: DC
  Administered 2022-10-14: 16 ug/min via INTRAVENOUS
  Administered 2022-10-15: 28 ug/min via INTRAVENOUS
  Administered 2022-10-15: 18 ug/min via INTRAVENOUS
  Administered 2022-10-16: 29 ug/min via INTRAVENOUS
  Administered 2022-10-16: 40 ug/min via INTRAVENOUS
  Filled 2022-10-14 (×5): qty 250

## 2022-10-14 MED ORDER — HYDROMORPHONE HCL 1 MG/ML IJ SOLN
1.0000 mg | INTRAMUSCULAR | Status: DC | PRN
  Administered 2022-10-14 – 2022-10-15 (×2): 1 mg via INTRAVENOUS
  Filled 2022-10-14 (×2): qty 1

## 2022-10-14 MED ORDER — NITROGLYCERIN 2 % TD OINT
1.0000 [in_us] | TOPICAL_OINTMENT | Freq: Three times a day (TID) | TRANSDERMAL | Status: AC
  Administered 2022-10-14 – 2022-10-15 (×4): 1 [in_us] via TOPICAL
  Filled 2022-10-14: qty 30

## 2022-10-14 MED ORDER — PHENTOLAMINE MESYLATE 5 MG IJ SOLR
5.0000 mg | Freq: Once | INTRAMUSCULAR | Status: AC
  Administered 2022-10-14: 5 mg via SUBCUTANEOUS
  Filled 2022-10-14: qty 5

## 2022-10-14 MED ORDER — NOREPINEPHRINE 4 MG/250ML-% IV SOLN
0.0000 ug/min | INTRAVENOUS | Status: DC
  Administered 2022-10-14: 20 ug/min via INTRAVENOUS
  Administered 2022-10-14: 18 ug/min via INTRAVENOUS
  Filled 2022-10-14: qty 250

## 2022-10-14 MED ORDER — OXYCODONE HCL 5 MG PO TABS
5.0000 mg | ORAL_TABLET | Freq: Three times a day (TID) | ORAL | Status: DC
  Filled 2022-10-14: qty 1

## 2022-10-14 MED ORDER — HEPARIN SODIUM (PORCINE) 1000 UNIT/ML IJ SOLN: 1.9000 mL | Freq: Once | INTRAMUSCULAR | Status: DC

## 2022-10-14 MED ORDER — CINACALCET HCL 30 MG PO TABS: 30.0000 mg | ORAL_TABLET | ORAL | Status: DC

## 2022-10-14 MED ORDER — METOCLOPRAMIDE HCL 5 MG/ML IJ SOLN
5.0000 mg | Freq: Three times a day (TID) | INTRAMUSCULAR | Status: AC
  Administered 2022-10-14 – 2022-10-16 (×6): 5 mg via INTRAVENOUS
  Filled 2022-10-14 (×4): qty 2

## 2022-10-14 NOTE — Progress Notes (Addendum)
eLink Physician-Brief Progress Note Patient Name: Morgan Clay DOB: [birth year] MRN: [id number]   Date of Service  10/14/2022  HPI/Events of Note  77 year old female with a history of cirrhosis, ESRD, A-fib, CAD, CAD who initially came in with rhinovirus/enterovirus associated respiratory failure and PEA arrest.   Tube feeds were restarted today but the patient is again having drainage from the nose.  Low intermittent suction and got 1 L of tube feeds out of the stomach.  Again placed on hold  eICU Interventions  Currently, Eliquis, Synthroid, midodrine, oxycodone and Rena-Vite are through the tube.  Would stop low intermittent suction and administer meds per usual then clamped the tube.  Continue to hold tube feeds.  Would add prokinetic agents, although last EKG showed significant QT prolongation.  Worthwhile to repeat this now.   2303 - EKG with QTC 580, Check mag in AM. Not a candidate for prokinetics at this time  Intervention Category Minor Interventions: Clinical assessment - ordering diagnostic tests  Nicolena Schurman 10/14/2022, 9:56 PM

## 2022-10-14 NOTE — Progress Notes (Addendum)
[facility] Progress Note  Subjective: pt seen in ICU. On levo gtt at 10-20 mcg/min. On vent peep 5, 0.30 FiO2.   Vitals:   10/14/22 1215 10/14/22 1230 10/14/22 1245 10/14/22 1300  BP: (!) 118/45 (!) 108/43 (!) 111/42 (!) 107/43  Pulse: 84 82 83 82  Resp: (!) 21 (!) 21 19 (!) 22  Temp:      TempSrc:      SpO2: 95% 95% 95% 96%  Weight:      Height:        Exam: Gen:on vent via trach CVS:RRR Resp: ventilated BS bilaterally Abd: +BS, soft, TN/ND Ext: 1+ edema of LUE, LUE AVF +T/B      OP HD: SW MWF 3.5h  400/600   58.5kg  2/2 bath  RIJ TDC  Heparin none - last HD 5/27, post wt 57.9kg - sensipar 30mg  po tiw - hectorol 4 mcg IV tiw     Assessment/ Plan: Hypoxemic resp failure/ multifocal pna - was on 12 L HFNC initially then seemed to be improving but had code blue on 5/31 w/ CPR and intubation and ROSC at 5 min. F/u CXR showed increased bilat patchy opacities, likely multifocal infection/ inflammation. Bronch showed pus/ tenacious secretions. Remains on IV abx Elita Quick) for pseudomonas PNA. Extubated then reintubated same day 10/08/22.  S/p trach on 10/10/22. Per CCM. Circulatory shock s/p cardiac arrest/ hx of pulm HTN - sp LHC/ RHC on 6/06, no sig CAD, pcwp 12.  Currently back on levophed gtt and midodrine 10 mg tid. ID - pseudomonas PNA as above. Also admit resp panel was +rhinovirus/ enterovirus Abd pain -  seen by gen surg, repeat abd CT was negative. Surg signed off.  ESRD - on HD MWF. Next HD Monday.  Volume - has been up several kg this week, has some UE edema, but pcwp was normal at 12 from RHC on 6/06. UF goals 1-2 L next HD.  Anemia esrd - Hb 9's here. Was not on esa at op unit. Will get tsat and ferritin and start esa 60 mcg SQ weekly on Saturdays.  MBD ckd - CCa and phos in range. Cont po sensipar and IV vdra w/ HD.  H/o pHTN H/o atrial fib Vascular access - has LUE AVF in place as well as RIJ TDC.  AVF temp unusable due to severe pain in left arm.   Continue with [facility] for now.     Vinson Moselle MD [facility] 10/14/2022, 2:32 PM  Recent Labs  Lab 10/08/22 1644 10/08/22 1817 10/13/22 0700 10/13/22 1013 10/14/22 0653 10/14/22 1152  HGB  --    < > 9.9*  --  9.1*  --   ALBUMIN  --    < > 1.8* 2.6*  --  2.4*  CALCIUM  --    < > 8.6*  --  8.7* 8.8*  PHOS 5.4*  --  3.5  --   --   --   CREATININE  --    < > 5.33*  --  3.74* 4.09*  K  --    < > 4.4  --  3.8 4.1   < > = values in this interval not displayed.   No results for input(s): "IRON", "TIBC", "FERRITIN" in the last 168 hours. Inpatient medications:  apixaban  2.5 mg Per Tube BID   atorvastatin  40 mg Per Tube Daily   Chlorhexidine Gluconate Cloth  6 each Topical Q0600   docusate  100 mg Per Tube BID  doxercalciferol  4 mcg Intravenous Q M,W,F-HD   feeding supplement (PROSource TF20)  60 mL Per Tube Daily   heparin sodium (porcine)  1.9 mL Intravenous Once   insulin aspart  0-15 Units Subcutaneous Q4H   insulin glargine-yfgn  5 Units Subcutaneous Daily   levothyroxine  125 mcg Per Tube Q0600   lidocaine  1 patch Transdermal Q24H   midodrine  15 mg Per Tube Q8H   multivitamin  1 tablet Per Tube QHS   nitroGLYCERIN  1 inch Topical Q8H   mouth rinse  15 mL Mouth Rinse Q2H   oxyCODONE  5 mg Per Tube TID   pantoprazole (PROTONIX) IV  40 mg Intravenous Q12H   polyethylene glycol  17 g Per Tube Daily   sodium chloride flush  3 mL Intravenous Q12H    sodium chloride Stopped (10/07/22 1027)   sodium chloride     anticoagulant sodium citrate     feeding supplement (VITAL 1.5 CAL) 20 mL/hr at 10/14/22 1300   norepinephrine (LEVOPHED) Adult infusion 16 mcg/min (10/14/22 1300)   Place/Maintain arterial line **AND** sodium chloride, alteplase, anticoagulant sodium citrate, HYDROmorphone (DILAUDID) injection, ipratropium-albuterol, mineral oil-hydrophilic petrolatum, ondansetron (ZOFRAN) IV, oxyCODONE, white petrolatum

## 2022-10-14 NOTE — Procedures (Signed)
Central Venous Catheter Insertion Procedure Note  Morgan Clay  [id number]  10/06/1945  Date:10/14/22  Time:4:18 AM   Provider Performing:Caison Hearn Judie Petit Pecola Leisure   Procedure: Insertion of Non-tunneled Central Venous Catheter(36556) with US guidance (40981)   Indication(s) Medication administration and Difficult access  Consent Unable to obtain consent due to emergent nature of procedure.  Anesthesia Topical only with 1% lidocaine   Timeout Verified patient identification, verified procedure, site/side was marked, verified correct patient position, special equipment/implants available, medications/allergies/relevant history reviewed, required imaging and test results available.  Sterile Technique Maximal sterile technique including full sterile barrier drape, hand hygiene, sterile gown, sterile gloves, mask, hair covering, sterile ultrasound probe cover (if used).  Procedure Description Area of catheter insertion was cleaned with chlorhexidine and draped in sterile fashion.  With real-time ultrasound guidance a central venous catheter was placed into the right femoral vein. Nonpulsatile blood flow and easy flushing noted in all ports.  The catheter was sutured in place and sterile dressing applied.   R femoral artery (vessel on L), R femoral vein (vessel on R)  Complications/Tolerance None; patient tolerated the procedure well. Chest X-ray is ordered to verify placement for internal jugular or subclavian cannulation.   Chest x-ray is not ordered for femoral cannulation.  EBL Minimal  Specimen(s) None  Tim Lair, [place] [facility] 10/14/22 4:19 AM  Please see [email or web address] for pager details.  From 7A-7P if no response, please call [phone] After hours, please call ELink [phone]

## 2022-10-14 NOTE — Progress Notes (Signed)
NAME:  Morgan Clay, MRN:  [id number], DOB:  11/13/1945, LOS: 12 ADMISSION DATE:  10-15-2022, CONSULTATION DATE:  10-15-22 REFERRING MD:  Ernie Avena, MD CHIEF COMPLAINT:  Respiratory Failure  History of Present Illness:  Morgan Clay is a 77 year old woman, never smoker with history of heart failure, pulmonary hypertension, asthma, ESRD on HD, cirrhosis secondary to hepatitis C s/p Harvoni treatment, atrial fibrillation and esophageal varices/small bowel AVMs who presented to [facility] with shortness of breath after dialysis, she was tachypneic with an SpO2 on arrival in the ER.   She was noted to have Lactic acid of 7.7 and was given a bolus with repeat lactic acid 7.6. Due to increase work of breathing she was placed on non-rebreather mask. She was given duoneb and solumedrol 125mg  IV. CTA Chest was negative for PE, there was motion artifact but mosaic attenuation of the pulmonary parenchyma. CT Neck showed possible mild pharyngeal mucosal swelling, but again difficult due to motion artifact. Neck CT was done for neck swelling. CT abdomen done for abdominal pain with no acute findings.   PCCM consulted as patient remained tachypneic with increased work of breathing after transfer from non-rebreather to HFNC.   Patient accepted in transfer from [facility] to [facility] ICU.  Pertinent  Medical History   Asthma Pulmonary Hypertension OSA Chronic Systolic Heart Failure Atrial Fibrillation DMII ESRD on HD Cirrhosis Hepatitis C s/p Harvoni Esophageal Varices Duodenal AVM  Significant Hospital Events: Including procedures, antibiotic start and stop dates in addition to other pertinent events   10/15/2022 presented to [facility], transferred to [facility] 5/28 remains on 12L, stable, viral panel + for rhinovirus/Enterovirus  5/29 lactic improving, surgery following serial abdominal exams, down to 4L [place].  5/30 down to room air. Initial resp failure  felt 2/2 rhinovirus pneumonitis. Lactate cleared felt 2/2 generalized multiorgan failure which was slow to clear d/t cirrhosis. Remained encephalopathic  5/31 surgery cleared for diet and signed off. Went to [employer]. Completed HD was found by family unresponsive. Not initially on tele. Staff called to room. She had no pulse. Rhythm PEA. Underwent 5 minutes of ACLS 1 round epi before ROSC. Did not regain LOC so intubated and transferred to ICU. Copious oral secretions noted during intubation 6/2 extubated and required reintubation later the same day 6/4 tracheostomy placed 6/6 LHC/RHC  No coronary disease. RA 9, PA 48/19 (30) PCWP 12 PA 78% CO 7.3 CI 4.7 PVR 2.5  6/7 hypotensive needing increasing pressors on dialysis  Interim History / Subjective:   Critically ill, trach, on higher doses of Levophed. Events overnight noted -pressor extravasation, episode of emesis, right femoral central line placed  Objective   Blood pressure (!) 115/43, pulse 83, temperature 98.5 F (36.9 C), temperature source Axillary, resp. rate 19, height 4' 11.02" (1.499 m), weight 61.8 kg, SpO2 96 %.    Vent Mode: PRVC FiO2 (%):  [35 %] 35 % Set Rate:  [30 bmp] 30 bmp Vt Set:  [350 mL] 350 mL PEEP:  [5 cmH20] 5 cmH20 Pressure Support:  [10 cmH20] 10 cmH20 Plateau Pressure:  [27 cmH20] 27 cmH20   Intake/Output Summary (Last 24 hours) at 10/14/2022 1232 Last data filed at 10/14/2022 1122 Gross per 24 hour  Intake 1062.3 ml  Output --  Net 1062.3 ml    Filed Weights   10/13/22 0745 10/13/22 1145 10/14/22 0500  Weight: 62.2 kg 60.2 kg 61.8 kg   Gen:  Acutely and chronically ill, elderly woman, no distress HEENT:  trach to vent Lungs:    Bilateral ventilated breath sounds, no accessory muscle use no wheeze, 6.0 cuffed shiley CV:         RRR Abd:      + bowel sounds; soft, non-tender; no palpable masses, no distension Ext:    No edema Skin:      Warm and dry; no rashes Neuro:  RASS 0 -1, lethargic, follows  one-step commands  Labs show mild hyponatremia, BUN 26, slightly elevated liver enzymes, bilirubin 5.8, INR 1.2  Chest x-ray 6/4 shows tracheostomy in position, persistent bilateral airspace disease X-ray KUB shows nonobstructive bowel gas pattern   Resolved Hospital Problem list     Lactic acidosis Hyperkalemia Traumatic pneumothorax  Assessment & Plan:   Acute hypoxic Respiratory Failure s/p PEA arrest c/b aspiration PNA and rib fractures 5/31.superimposed on recent  viral pneumonia and asthma exacerbation and h/o OSA and PH.  RVP positive for rhinovirus  Plan S/p tracheostomy Weaning on pressure support 12/5    Pseudomonas Pneumonia -pansensitive -ceftazidime for total 7 days  Mixed septic/cardiogenic shock s/p cardiac arrest w/ known Pulmonary Hypertension,  LVH, HFrEF right heart failure and aortic insuff. History of BiV-ICD with CRT.  Left heart cath normal Suspected Hypoxemia mediated PEA cardiac arrest in hospital 5/31. Time to ROSC 5 minutes.  NSTEMI Paroxysmal Atrial fib  Plan  Increasing pressors. will increase midodrine to 15 mg TID. Given AI will aim for goal MAP>90 rather than a map goal.  -Switched to Eliquis , monitor carefully in view of rising LFTs - cardiology following  Anemia of Chronic Disease Melenontic stools - no recurrence Plan Cbc Transfuse for hgb < 7  Continue bid ppi  Acute metabolic encephalopathy Pain from rib fractures from CPR.  CT head was neg. Ammonia was nml  Clinically improved but at risk for delirium Decrease oxycodone 5 mg 3 times daily  Type 2 DM - SSI with tube feed coverage  Abdominal Pain Cirrhosis Elevated Liver Enzymes-due to hepatic congestion due to Right heart failure and hypoxemia Right inguinal hernia - initial CTA without acute pathology -Episode of emesis 6/8 -use Zofran, resume trickle tube feeds and follow clinically, no evidence of obstruction on KUB  ESRD on HD Plan Nephro following HD again on  Monday  Pressor extravasation -using nitroglycerin cream and phentolamine  Best Practice (right click and "Reselect all SmartList Selections" daily)   Diet/type: NPO w/ meds via tube DVT prophylaxis: SCD GI prophylaxis: N/A Lines: N/A Foley:  N/A Code Status:  full code Last date of multidisciplinary goals of care discussion [daughter updated at bedside 6/8]  Dispo - plan for [facility] later this week.  The patient is critically ill due to respiratory failure, cardiac arrest.  Critical care was necessary to treat or prevent imminent or life-threatening deterioration.  Critical care was time spent personally by me on the following activities: development of treatment plan with patient and/or surrogate as well as nursing, discussions with consultants, evaluation of patient's response to treatment, examination of patient, obtaining history from patient or surrogate, ordering and performing treatments and interventions, ordering and review of laboratory studies, ordering and review of radiographic studies, pulse oximetry, re-evaluation of patient's condition and participation in multidisciplinary rounds.   Critical Care Time devoted to patient care services described in this note is 35 minutes. This time reflects time of care of this signee Marlaya Turck V. Jalessa Peyser . This critical care time does not reflect separately billable  procedures or procedure time, teaching time or supervisory time of PA/NP/Med student/Med Resident etc but could involve care discussion time.       Comer Locket Lanny Cramp [facility] Critical [facility] 10/14/2022 12:32 PM  Pager: see AMION  If no response to pager , please call critical care on call (see AMION) until 7pm After 7:00 pm call Elink

## 2022-10-15 ENCOUNTER — Inpatient Hospital Stay (HOSPITAL_COMMUNITY): Payer: 59

## 2022-10-15 DIAGNOSIS — E875 Hyperkalemia: Secondary | ICD-10-CM

## 2022-10-15 DIAGNOSIS — R6521 Severe sepsis with septic shock: Secondary | ICD-10-CM

## 2022-10-15 DIAGNOSIS — J9601 Acute respiratory failure with hypoxia: Secondary | ICD-10-CM | POA: Diagnosis not present

## 2022-10-15 DIAGNOSIS — A419 Sepsis, unspecified organism: Secondary | ICD-10-CM | POA: Diagnosis not present

## 2022-10-15 DIAGNOSIS — N186 End stage renal disease: Secondary | ICD-10-CM | POA: Diagnosis not present

## 2022-10-15 LAB — BASIC METABOLIC PANEL
Anion gap: 25 — ABNORMAL HIGH (ref 5–15)
Anion gap: 31 — ABNORMAL HIGH (ref 5–15)
BUN: 36 mg/dL — ABNORMAL HIGH (ref 8–23)
BUN: 37 mg/dL — ABNORMAL HIGH (ref 8–23)
CO2: 15 mmol/L — ABNORMAL LOW (ref 22–32)
CO2: 12 mmol/L — ABNORMAL LOW (ref 22–32)
Calcium: 8.9 mg/dL (ref 8.9–10.3)
Calcium: 8.8 mg/dL — ABNORMAL LOW (ref 8.9–10.3)
Chloride: 92 mmol/L — ABNORMAL LOW (ref 98–111)
Chloride: 94 mmol/L — ABNORMAL LOW (ref 98–111)
Creatinine, Ser: 5.11 mg/dL — ABNORMAL HIGH (ref 0.44–1.00)
Creatinine, Ser: 4.7 mg/dL — ABNORMAL HIGH (ref 0.44–1.00)
GFR, Estimated: 8 mL/min — ABNORMAL LOW (ref >60)
GFR, Estimated: 9 mL/min — ABNORMAL LOW (ref >60)
Glucose, Bld: 133 mg/dL — ABNORMAL HIGH (ref 70–99)
Glucose, Bld: 94 mg/dL (ref 70–99)
Potassium: 6 mmol/L — ABNORMAL HIGH (ref 3.5–5.1)
Potassium: 5.6 mmol/L — ABNORMAL HIGH (ref 3.5–5.1)
Sodium: 132 mmol/L — ABNORMAL LOW (ref 135–145)
Sodium: 137 mmol/L (ref 135–145)

## 2022-10-15 LAB — RENAL FUNCTION PANEL
Albumin: 2 g/dL — ABNORMAL LOW (ref 3.5–5.0)
Anion gap: 28 — ABNORMAL HIGH (ref 5–15)
BUN: 32 mg/dL — ABNORMAL HIGH (ref 8–23)
CO2: 12 mmol/L — ABNORMAL LOW (ref 22–32)
Calcium: 8.8 mg/dL — ABNORMAL LOW (ref 8.9–10.3)
Chloride: 93 mmol/L — ABNORMAL LOW (ref 98–111)
Creatinine, Ser: 4.27 mg/dL — ABNORMAL HIGH (ref 0.44–1.00)
GFR, Estimated: 10 mL/min — ABNORMAL LOW (ref >60)
Glucose, Bld: 77 mg/dL (ref 70–99)
Phosphorus: 7.6 mg/dL — ABNORMAL HIGH (ref 2.5–4.6)
Potassium: 5.5 mmol/L — ABNORMAL HIGH (ref 3.5–5.1)
Sodium: 133 mmol/L — ABNORMAL LOW (ref 135–145)

## 2022-10-15 LAB — POCT I-STAT 7, (LYTES, BLD GAS, ICA,H+H)
Acid-base deficit: 18 mmol/L — ABNORMAL HIGH (ref 0.0–2.0)
Acid-base deficit: 17 mmol/L — ABNORMAL HIGH (ref 0.0–2.0)
Bicarbonate: 12.5 mmol/L — ABNORMAL LOW (ref 20.0–28.0)
Bicarbonate: 12 mmol/L — ABNORMAL LOW (ref 20.0–28.0)
Calcium, Ion: 1.02 mmol/L — ABNORMAL LOW (ref 1.15–1.40)
Calcium, Ion: 0.99 mmol/L — ABNORMAL LOW (ref 1.15–1.40)
HCT: 32 % — ABNORMAL LOW (ref 36.0–46.0)
HCT: 30 % — ABNORMAL LOW (ref 36.0–46.0)
Hemoglobin: 10.9 g/dL — ABNORMAL LOW (ref 12.0–15.0)
Hemoglobin: 10.2 g/dL — ABNORMAL LOW (ref 12.0–15.0)
O2 Saturation: 72 %
O2 Saturation: 88 %
Patient temperature: 98.3
Patient temperature: 98.3
Potassium: 6.1 mmol/L — ABNORMAL HIGH (ref 3.5–5.1)
Potassium: 5.2 mmol/L — ABNORMAL HIGH (ref 3.5–5.1)
Sodium: 131 mmol/L — ABNORMAL LOW (ref 135–145)
Sodium: 131 mmol/L — ABNORMAL LOW (ref 135–145)
TCO2: 14 mmol/L — ABNORMAL LOW (ref 22–32)
TCO2: 13 mmol/L — ABNORMAL LOW (ref 22–32)
pCO2 arterial: 48.8 mmHg — ABNORMAL HIGH (ref 32–48)
pCO2 arterial: 40.9 mmHg (ref 32–48)
pH, Arterial: 7.016 — CL (ref 7.35–7.45)
pH, Arterial: 7.074 — CL (ref 7.35–7.45)
pO2, Arterial: 56 mmHg — ABNORMAL LOW (ref 83–108)
pO2, Arterial: 74 mmHg — ABNORMAL LOW (ref 83–108)

## 2022-10-15 LAB — CBC
HCT: 29.1 % — ABNORMAL LOW (ref 36.0–46.0)
Hemoglobin: 9 g/dL — ABNORMAL LOW (ref 12.0–15.0)
MCH: 29.9 pg (ref 26.0–34.0)
MCHC: 30.9 g/dL (ref 30.0–36.0)
MCV: 96.7 fL (ref 80.0–100.0)
Platelets: 120 10*3/uL — ABNORMAL LOW (ref 150–400)
RBC: 3.01 MIL/uL — ABNORMAL LOW (ref 3.87–5.11)
RDW: 30.2 % — ABNORMAL HIGH (ref 11.5–15.5)
WBC: 22.6 10*3/uL — ABNORMAL HIGH (ref 4.0–10.5)
nRBC: 10 % — ABNORMAL HIGH (ref 0.0–0.2)

## 2022-10-15 LAB — IRON AND TIBC
Iron: 63 ug/dL (ref 28–170)
Saturation Ratios: 38 % — ABNORMAL HIGH (ref 10.4–31.8)
TIBC: 168 ug/dL — ABNORMAL LOW (ref 250–450)
UIBC: 105 ug/dL

## 2022-10-15 LAB — GLUCOSE, CAPILLARY
Glucose-Capillary: <10 mg/dL — CL (ref 70–99)
Glucose-Capillary: 105 mg/dL — ABNORMAL HIGH (ref 70–99)
Glucose-Capillary: 88 mg/dL (ref 70–99)
Glucose-Capillary: 127 mg/dL — ABNORMAL HIGH (ref 70–99)
Glucose-Capillary: 81 mg/dL (ref 70–99)
Glucose-Capillary: 52 mg/dL — ABNORMAL LOW (ref 70–99)
Glucose-Capillary: 152 mg/dL — ABNORMAL HIGH (ref 70–99)
Glucose-Capillary: 84 mg/dL (ref 70–99)

## 2022-10-15 LAB — FERRITIN: Ferritin: 1403 ng/mL — ABNORMAL HIGH (ref 11–307)

## 2022-10-15 MED ORDER — HEPARIN SODIUM (PORCINE) 1000 UNIT/ML DIALYSIS
1000.0000 [IU] | INTRAMUSCULAR | Status: DC | PRN
  Administered 2022-10-15: 2800 [IU] via INTRAVENOUS_CENTRAL
  Filled 2022-10-15: qty 6
  Filled 2022-10-15: qty 3

## 2022-10-15 MED ORDER — PRISMASOL BGK 0/2.5 32-2.5 MEQ/L EC SOLN
Status: DC
  Filled 2022-10-15 (×2): qty 5000

## 2022-10-15 MED ORDER — DEXTROSE 50 % IV SOLN
INTRAVENOUS | Status: AC
  Administered 2022-10-15: 25 g via INTRAVENOUS
  Filled 2022-10-15: qty 50

## 2022-10-15 MED ORDER — PHENYLEPHRINE 80 MCG/ML (10ML) SYRINGE FOR IV PUSH (FOR BLOOD PRESSURE SUPPORT): 240.0000 ug | PREFILLED_SYRINGE | Freq: Once | INTRAVENOUS | Status: AC | PRN

## 2022-10-15 MED ORDER — HYDROMORPHONE HCL 1 MG/ML IJ SOLN
1.0000 mg | Freq: Once | INTRAMUSCULAR | Status: AC
  Administered 2022-10-15: 1 mg via INTRAVENOUS
  Filled 2022-10-15: qty 1

## 2022-10-15 MED ORDER — DEXTROSE 50 % IV SOLN
12.5000 g | INTRAVENOUS | Status: AC
  Administered 2022-10-15: 12.5 g via INTRAVENOUS

## 2022-10-15 MED ORDER — SODIUM BICARBONATE 8.4 % IV SOLN
50.0000 meq | Freq: Once | INTRAVENOUS | Status: AC
  Administered 2022-10-15: 50 meq via INTRAVENOUS
  Filled 2022-10-15: qty 50

## 2022-10-15 MED ORDER — DEXTROSE 50 % IV SOLN
INTRAVENOUS | Status: AC
  Administered 2022-10-15: 50 mL
  Filled 2022-10-15: qty 50

## 2022-10-15 MED ORDER — SODIUM BICARBONATE 8.4 % IV SOLN
INTRAVENOUS | Status: AC
  Filled 2022-10-15: qty 100

## 2022-10-15 MED ORDER — SODIUM BICARBONATE 8.4 % IV SOLN
100.0000 meq | Freq: Once | INTRAVENOUS | Status: AC
  Administered 2022-10-15: 100 meq via INTRAVENOUS

## 2022-10-15 MED ORDER — SODIUM BICARBONATE 8.4 % IV SOLN
INTRAVENOUS | Status: AC
  Filled 2022-10-15: qty 50

## 2022-10-15 MED ORDER — DEXTROSE 50 % IV SOLN
INTRAVENOUS | Status: AC
  Filled 2022-10-15: qty 50

## 2022-10-15 MED ORDER — STERILE WATER FOR INJECTION IV SOLN
INTRAVENOUS | Status: DC
  Filled 2022-10-15 (×5): qty 150

## 2022-10-15 MED ORDER — PHENYLEPHRINE 80 MCG/ML (10ML) SYRINGE FOR IV PUSH (FOR BLOOD PRESSURE SUPPORT)
PREFILLED_SYRINGE | INTRAVENOUS | Status: AC
  Administered 2022-10-15: 240 ug via INTRAVENOUS
  Filled 2022-10-15: qty 10

## 2022-10-15 MED ORDER — PRISMASOL BGK 0/2.5 32-2.5 MEQ/L EC SOLN
Status: DC
  Filled 2022-10-15 (×14): qty 5000

## 2022-10-15 MED ORDER — CALCIUM CHLORIDE 10 % IV SOLN
INTRAVENOUS | Status: AC
  Administered 2022-10-15: 1 g via INTRAVENOUS
  Filled 2022-10-15: qty 10

## 2022-10-15 MED ORDER — CALCIUM GLUCONATE-NACL 1-0.675 GM/50ML-% IV SOLN
1.0000 g | Freq: Once | INTRAVENOUS | Status: DC
  Filled 2022-10-15: qty 50

## 2022-10-15 MED ORDER — DEXTROSE 50 % IV SOLN: 25.0000 g | INTRAVENOUS | Status: AC

## 2022-10-15 MED ORDER — STERILE WATER FOR INJECTION IV SOLN
INTRAVENOUS | Status: DC
  Filled 2022-10-15 (×4): qty 150

## 2022-10-15 MED ORDER — DEXTROSE 10 % IV SOLN: INTRAVENOUS | Status: DC

## 2022-10-15 MED ORDER — CALCIUM CHLORIDE 10 % IV SOLN: 1.0000 g | Freq: Once | INTRAVENOUS | Status: AC

## 2022-10-15 MED ORDER — SODIUM BICARBONATE 8.4 % IV SOLN: 100.0000 meq | Freq: Once | INTRAVENOUS | Status: DC

## 2022-10-15 MED ORDER — EPINEPHRINE HCL 5 MG/250ML IV SOLN IN NS
INTRAVENOUS | Status: AC
  Filled 2022-10-15: qty 250

## 2022-10-15 MED ORDER — SODIUM BICARBONATE 8.4 % IV SOLN
100.0000 meq | INTRAVENOUS | Status: AC
  Administered 2022-10-15: 100 meq via INTRAVENOUS

## 2022-10-15 MED ORDER — SODIUM BICARBONATE 8.4 % IV SOLN: 50.0000 meq | INTRAVENOUS | Status: AC

## 2022-10-15 MED ORDER — SODIUM ZIRCONIUM CYCLOSILICATE 10 G PO PACK
10.0000 g | PACK | Freq: Once | ORAL | Status: AC
  Administered 2022-10-15: 10 g
  Filled 2022-10-15: qty 1

## 2022-10-15 NOTE — Progress Notes (Addendum)
NAME:  Morgan Clay, MRN:  [id number], DOB:  [birth year], LOS: 13 ADMISSION DATE:  09/08/2022, CONSULTATION DATE:  09/16/2022 REFERRING MD:  Ernie Avena, MD CHIEF COMPLAINT:  Respiratory Failure  History of Present Illness:  Morgan Clay is a 77 year old woman, never smoker with history of heart failure, pulmonary hypertension, asthma, ESRD on HD, cirrhosis secondary to hepatitis C s/p Harvoni treatment, atrial fibrillation and esophageal varices/small bowel AVMs who presented to [facility] with shortness of breath after dialysis, she was tachypneic with an SpO2 on arrival in the ER.   She was noted to have Lactic acid of 7.7 and was given a bolus with repeat lactic acid 7.6. Due to increase work of breathing she was placed on non-rebreather mask. She was given duoneb and solumedrol 125mg  IV. CTA Chest was negative for PE, there was motion artifact but mosaic attenuation of the pulmonary parenchyma. CT Neck showed possible mild pharyngeal mucosal swelling, but again difficult due to motion artifact. Neck CT was done for neck swelling. CT abdomen done for abdominal pain with no acute findings.   PCCM consulted as patient remained tachypneic with increased work of breathing after transfer from non-rebreather to HFNC.   Patient accepted in transfer from [facility] to [facility] ICU.  Pertinent  Medical History   Asthma Pulmonary Hypertension OSA Chronic Systolic Heart Failure Atrial Fibrillation DMII ESRD on HD Cirrhosis Hepatitis C s/p Harvoni Esophageal Varices Duodenal AVM  Significant Hospital Events: Including procedures, antibiotic start and stop dates in addition to other pertinent events   5/27 presented to [facility], transferred to [facility] 5/28 remains on 12L, stable, viral panel + for rhinovirus/Enterovirus  5/29 lactic improving, surgery following serial abdominal exams, down to 4L [place].  5/30 down to room air. Initial resp failure  felt 2/2 rhinovirus pneumonitis. Lactate cleared felt 2/2 generalized multiorgan failure which was slow to clear d/t cirrhosis. Remained encephalopathic  5/31 surgery cleared for diet and signed off. Went to [employer]. Completed HD was found by family unresponsive. Not initially on tele. Staff called to room. She had no pulse. Rhythm PEA. Underwent 5 minutes of ACLS 1 round epi before ROSC. Did not regain LOC so intubated and transferred to ICU. Copious oral secretions noted during intubation 6/2 extubated and required reintubation later the same day 6/4 tracheostomy placed 6/6 LHC/RHC  No coronary disease. RA 9, PA 48/19 (30) PCWP 12 PA 78% CO 7.3 CI 4.7 PVR 2.5  6/7 hypotensive needing increasing pressors on dialysis 6/8-pressor extravasation, episode of emesis, right femoral central line placed  Interim History / Subjective:   Critically ill, trach,  Levophed tapered to 9 mics overnight but back up this morning. Emesis yesterday, tube feeds held and started on Reglan, episodes of hypoglycemia overnight  Objective   Blood pressure (!) 87/33, pulse 60, temperature 98.3 F (36.8 C), temperature source Axillary, resp. rate 15, height 4' 11.02" (1.499 m), weight 60.9 kg, SpO2 98 %.    Vent Mode: PRVC FiO2 (%):  [30 %-35 %] 35 % Set Rate:  [30 bmp] 30 bmp Vt Set:  [350 mL] 350 mL PEEP:  [5 cmH20] 5 cmH20 Pressure Support:  [12 cmH20] 12 cmH20   Intake/Output Summary (Last 24 hours) at 10/15/2022 0804 Last data filed at 10/15/2022 0600 Gross per 24 hour  Intake 703.38 ml  Output 1000 ml  Net -296.62 ml    Filed Weights   10/13/22 1145 10/14/22 0500 10/15/22 0500  Weight: 60.2 kg  61.8 kg 60.9 kg   Gen:      Acutely and chronically ill, elderly woman, mild distress HEENT:  trach to vent Lungs:    Bilateral ventilated breath sounds, no accessory muscle use no wheeze, 6.0 cuffed shiley CV:         Irregular rate and rhythm Abd:      + bowel sounds; soft, non-tender; no palpable masses, no  distension Ext:    No edema Skin:      Warm and dry; no rashes Neuro:  RASS 0 -1, lethargic, follows one-step commands  Labs show mild hyponatremia, BUN 36,  elevated liver enzymes, bilirubin 14.6, INR 1.2  Chest x-ray 6/9 shows tracheostomy in position, persistent bilateral airspace disease X-ray KUB shows nonobstructive bowel gas pattern   Resolved Hospital Problem list     Lactic acidosis Hyperkalemia Traumatic pneumothorax  Assessment & Plan:  She looks much worse this morning with increasing pressors, acidotic breathing and hyperkalemia , possibly aspiration insult although chest x-ray does not look worse    Acute hypoxic Respiratory Failure s/p PEA arrest c/b aspiration PNA and rib fractures 5/31.superimposed on recent  viral pneumonia and asthma exacerbation and h/o OSA and PH.  RVP positive for rhinovirus  Plan S/p tracheostomy No spontaneous breathing trials today   Pseudomonas Pneumonia -pansensitive -ceftazidime for total 7 days -Repeat respiratory culture  Mixed septic/cardiogenic shock s/p cardiac arrest w/ known Pulmonary Hypertension,  LVH, HFrEF right heart failure and aortic insuff. History of BiV-ICD with CRT.  Left heart cath normal Suspected Hypoxemia mediated PEA cardiac arrest in hospital 5/31. Time to ROSC 5 minutes.  NSTEMI Paroxysmal Atrial fib  Plan  Increasing pressors -Place arterial line  Increase midodrine to 15 mg TID. Given AI aiming for goal MAP>90 rather than a map goal.  -Hold Eliquis for procedures this morning, monitor carefully in view of rising LFTs - cardiology following  ESRD on HD Hyperkalemia 6/9 Plan Nephro following Treat hyperkalemia with bicarbonate, calcium, Lokelma x 1, if does not improve, then may need dialysis today rather than Monday  Anemia of Chronic Disease Melenontic stools - no recurrence Plan Cbc Transfuse for hgb < 7  Continue bid ppi  Acute metabolic encephalopathy Pain from rib fractures from CPR.   CT head was neg. Ammonia was nml  Clinically improved but at risk for delirium Dc scheduled oxycodone and use as needed  Type 2 DM Episode of hypoglycemia 6/9  -DC Lantus, hold SSI, add D10  Abdominal Pain Cirrhosis Elevated LFTs-due to  shock liver vs hepatic congestion due to Right heart failure and hypoxemia Right inguinal hernia Ileus -Episodes of emesis 6/8  no evidence of obstruction on KUB - initial CTA without acute pathology  -Hold statin -Tube feeds on hold -Added Reglan for 6 doses  Pressor extravasation -using nitroglycerin cream and phentolamine  Best Practice (right click and "Reselect all SmartList Selections" daily)   Diet/type: NPO w/ meds via tube DVT prophylaxis: SCD GI prophylaxis: N/A Lines: N/A Foley:  N/A Code Status:  full code Last date of multidisciplinary goals of care discussion [daughter updated at bedside 6/8]  Dispo - plan was for LTAC but may be on hold due to deterioration  The patient is critically ill due to respiratory failure, cardiac arrest.  Critical care was necessary to treat or prevent imminent or life-threatening deterioration.  Critical care was time spent personally by me on the following activities: development of treatment plan with patient and/or surrogate as well as nursing, discussions with  consultants, evaluation of patient's response to treatment, examination of patient, obtaining history from patient or surrogate, ordering and performing treatments and interventions, ordering and review of laboratory studies, ordering and review of radiographic studies, pulse oximetry, re-evaluation of patient's condition and participation in multidisciplinary rounds.   Critical Care Time devoted to patient care services described in this note is 45 minutes. This time reflects time of care of this signee Morgan Clay V. Heman Que . This critical care time does not reflect separately billable procedures or procedure time, teaching time or supervisory time  of PA/NP/Med student/Med Resident etc but could involve care discussion time.       Comer Locket Lanny Cramp Pulmonary [facility] Critical [facility] 10/15/2022 8:04 AM  Pager: see AMION  If no response to pager , please call critical care on call (see AMION) until 7pm After 7:00 pm call Elink

## 2022-10-15 NOTE — Progress Notes (Signed)
Hypoglycemic Event  CBG: low  Treatment: D50 50 mL (25 gm)  Symptoms: None  Follow-up CBG: Time:0345 CBG Result:105   Possible Reasons for Event: Unknown  Comments/MD notified:Elink notified    Beatrix Shipper

## 2022-10-15 NOTE — Progress Notes (Signed)
[facility] Progress Note  Subjective: pt seen in ICU. Remains on higher doses of levo gtt. BP's lower this am in the 80's. Aline placed. Acidosis worsening, pH 7.0, pCO2 48 on ABG this am. Also hyperkalemia not responding to temporizing measures. Will need CRRT.   Vitals:   10/15/22 0615 10/15/22 0630 10/15/22 0740 10/15/22 0744  BP: (!) 88/34 (!) 87/33    Pulse: 69 69 60   Resp: 14 16 15    Temp:    98.3 F (36.8 C)  TempSrc:    Axillary  SpO2: 96% 96% 98%   Weight:      Height:        Exam: Gen:on vent via trach CVS:RRR Resp: ventilated BS bilaterally Abd: +BS, soft, TN/ND Ext: 1+ edema of LUE, LUE AVF +T/B      OP HD: SW MWF 3.5h  400/600   58.5kg  2/2 bath  RIJ TDC  Heparin none - last HD 10-06-22, post wt 57.9kg - sensipar 30mg  po tiw - hectorol 4 mcg IV tiw     Assessment/ Plan: Shock/ hx pHTN - worsening today w/ ^'d levo requirement and acidemia and hyperkalemia. Will need CRRT.  SP cardiac arrest - in hospital 5/31,  5 min to ROSC. SP LHC/ RHC on 6/06 w/o sig CAD.  Hypoxemic resp failure/pseudomonas + aspiration pna - was on 12 L HFNC initially then seemed to be improving but had code blue. Bronch showed pus/ tenacious secretions. Extubated then reintubated same day 10/08/22.  S/p trach on 10/10/22. Remains on IV abx Elita Quick) for pseudomonas PNA. Per CCM. ID - pseudomonas PNA as above. Also admit resp panel was +rhinovirus/ enterovirus ESRD - on HD MWF. Last HD was Friday. Worsening shock/ acidosis --> plan CRRT Volume - some +UE edema, but pcwp normal at 12 from [facility] - Closed 6/06 Anemia esrd - Hb 9's here. Was not on esa at op unit. Tsat 38%, no need for IV Fe. Have started esa w/ darbe 60 mcg SQ weekly on Saturdays.  MBD ckd - CCa and phos in range. Cont po sensipar and IV vdra w/ HD. Abd pain -  seen by gen surg, repeat abd CT was negative. Surg signed off.   H/o pHTN H/o atrial fib Vascular access - has LUE AVF in place as well as RIJ TDC.  AVF temp unusable  due to severe pain in left arm.  Continue with [facility] for now.     Vinson Moselle MD [facility] 10/15/2022, 10:29 AM  Recent Labs  Lab 10/08/22 1644 10/08/22 1817 10/13/22 0700 10/13/22 1013 10/14/22 0653 10/14/22 1152 10/15/22 0342 10/15/22 0918  HGB  --    < > 9.9*  --    < >  --  9.0* 10.9*  ALBUMIN  --    < > 1.8* 2.6*  --  2.4*  --   --   CALCIUM  --    < > 8.6*  --    < > 8.8* 8.9  --   PHOS 5.4*  --  3.5  --   --   --   --   --   CREATININE  --    < > 5.33*  --    < > 4.09* 5.11*  --   K  --    < > 4.4  --    < > 4.1 6.0* 6.1*   < > = values in this interval not displayed.    Recent Labs  Lab 10/15/22 0342  IRON  63  TIBC 168*  FERRITIN 1,403*   Inpatient medications:  Chlorhexidine Gluconate Cloth  6 each Topical Q0600   darbepoetin (ARANESP) injection - DIALYSIS  60 mcg Subcutaneous Q Sat-1800   docusate  100 mg Per Tube BID   doxercalciferol  4 mcg Intravenous Q M,W,F-HD   EPINEPHrine NaCl       heparin sodium (porcine)  1.9 mL Intravenous Once   levothyroxine  125 mcg Per Tube Q0600   lidocaine  1 patch Transdermal Q24H   metoCLOPramide (REGLAN) injection  5 mg Intravenous Q8H   midodrine  15 mg Per Tube Q8H   multivitamin  1 tablet Per Tube QHS   nitroGLYCERIN  1 inch Topical Q8H   mouth rinse  15 mL Mouth Rinse Q2H   pantoprazole (PROTONIX) IV  40 mg Intravenous Q12H   polyethylene glycol  17 g Per Tube Daily   sodium chloride flush  3 mL Intravenous Q12H    sodium chloride Stopped (10/07/22 1027)   sodium chloride     anticoagulant sodium citrate     dextrose 40 mL/hr at 10/15/22 0932   norepinephrine (LEVOPHED) Adult infusion 18 mcg/min (10/15/22 0733)   Place/Maintain arterial line **AND** sodium chloride, alteplase, anticoagulant sodium citrate, EPINEPHrine NaCl, HYDROmorphone (DILAUDID) injection, ipratropium-albuterol, mineral oil-hydrophilic petrolatum, ondansetron (ZOFRAN) IV, oxyCODONE, white petrolatum

## 2022-10-16 ENCOUNTER — Other Ambulatory Visit: Payer: Self-pay | Admitting: *Deleted

## 2022-10-16 ENCOUNTER — Inpatient Hospital Stay (HOSPITAL_COMMUNITY): Payer: 59

## 2022-10-16 DIAGNOSIS — N186 End stage renal disease: Secondary | ICD-10-CM

## 2022-10-16 DIAGNOSIS — E872 Acidosis, unspecified: Secondary | ICD-10-CM | POA: Diagnosis not present

## 2022-10-16 DIAGNOSIS — R6521 Severe sepsis with septic shock: Secondary | ICD-10-CM | POA: Diagnosis not present

## 2022-10-16 DIAGNOSIS — A419 Sepsis, unspecified organism: Secondary | ICD-10-CM | POA: Diagnosis not present

## 2022-10-16 DIAGNOSIS — J9601 Acute respiratory failure with hypoxia: Secondary | ICD-10-CM | POA: Diagnosis not present

## 2022-10-16 LAB — CBC WITH DIFFERENTIAL/PLATELET
Abs Immature Granulocytes: 0 10*3/uL (ref 0.00–0.07)
Basophils Absolute: 0.2 10*3/uL — ABNORMAL HIGH (ref 0.0–0.1)
Basophils Relative: 1 %
Eosinophils Absolute: 0.2 10*3/uL (ref 0.0–0.5)
Eosinophils Relative: 1 %
HCT: 28.5 % — ABNORMAL LOW (ref 36.0–46.0)
Hemoglobin: 8.3 g/dL — ABNORMAL LOW (ref 12.0–15.0)
Lymphocytes Relative: 11 %
Lymphs Abs: 1.8 10*3/uL (ref 0.7–4.0)
MCH: 29.9 pg (ref 26.0–34.0)
MCHC: 29.1 g/dL — ABNORMAL LOW (ref 30.0–36.0)
MCV: 102.5 fL — ABNORMAL HIGH (ref 80.0–100.0)
Monocytes Absolute: 1 10*3/uL (ref 0.1–1.0)
Monocytes Relative: 6 %
Neutro Abs: 13 10*3/uL — ABNORMAL HIGH (ref 1.7–7.7)
Neutrophils Relative %: 81 %
Platelets: 75 10*3/uL — ABNORMAL LOW (ref 150–400)
RBC: 2.78 MIL/uL — ABNORMAL LOW (ref 3.87–5.11)
RDW: 31.3 % — ABNORMAL HIGH (ref 11.5–15.5)
WBC: 16.1 10*3/uL — ABNORMAL HIGH (ref 4.0–10.5)
nRBC: 62 % — ABNORMAL HIGH (ref 0.0–0.2)
nRBC: 62 /100 WBC — ABNORMAL HIGH

## 2022-10-16 LAB — RENAL FUNCTION PANEL
Albumin: 1.9 g/dL — ABNORMAL LOW (ref 3.5–5.0)
Anion gap: 29 — ABNORMAL HIGH (ref 5–15)
BUN: 18 mg/dL (ref 8–23)
CO2: 15 mmol/L — ABNORMAL LOW (ref 22–32)
Calcium: 7.3 mg/dL — ABNORMAL LOW (ref 8.9–10.3)
Chloride: 87 mmol/L — ABNORMAL LOW (ref 98–111)
Creatinine, Ser: 2.53 mg/dL — ABNORMAL HIGH (ref 0.44–1.00)
GFR, Estimated: 19 mL/min — ABNORMAL LOW (ref >60)
Glucose, Bld: 53 mg/dL — ABNORMAL LOW (ref 70–99)
Phosphorus: 6.8 mg/dL — ABNORMAL HIGH (ref 2.5–4.6)
Potassium: 4.9 mmol/L (ref 3.5–5.1)
Sodium: 131 mmol/L — ABNORMAL LOW (ref 135–145)

## 2022-10-16 LAB — POCT I-STAT 7, (LYTES, BLD GAS, ICA,H+H)
Acid-base deficit: 12 mmol/L — ABNORMAL HIGH (ref 0.0–2.0)
Acid-base deficit: 13 mmol/L — ABNORMAL HIGH (ref 0.0–2.0)
Bicarbonate: 16.5 mmol/L — ABNORMAL LOW (ref 20.0–28.0)
Bicarbonate: 16.5 mmol/L — ABNORMAL LOW (ref 20.0–28.0)
Calcium, Ion: 0.85 mmol/L — CL (ref 1.15–1.40)
Calcium, Ion: 0.8 mmol/L — CL (ref 1.15–1.40)
HCT: 33 % — ABNORMAL LOW (ref 36.0–46.0)
HCT: 29 % — ABNORMAL LOW (ref 36.0–46.0)
Hemoglobin: 11.2 g/dL — ABNORMAL LOW (ref 12.0–15.0)
Hemoglobin: 9.9 g/dL — ABNORMAL LOW (ref 12.0–15.0)
O2 Saturation: 90 %
O2 Saturation: 98 %
Patient temperature: 98.4
Patient temperature: 98.6
Potassium: 4.8 mmol/L (ref 3.5–5.1)
Potassium: 4.9 mmol/L (ref 3.5–5.1)
Sodium: 129 mmol/L — ABNORMAL LOW (ref 135–145)
Sodium: 127 mmol/L — ABNORMAL LOW (ref 135–145)
TCO2: 18 mmol/L — ABNORMAL LOW (ref 22–32)
TCO2: 18 mmol/L — ABNORMAL LOW (ref 22–32)
pCO2 arterial: 49.3 mmHg — ABNORMAL HIGH (ref 32–48)
pCO2 arterial: 58.1 mmHg — ABNORMAL HIGH (ref 32–48)
pH, Arterial: 7.133 — CL (ref 7.35–7.45)
pH, Arterial: 7.062 — CL (ref 7.35–7.45)
pO2, Arterial: 78 mmHg — ABNORMAL LOW (ref 83–108)
pO2, Arterial: 147 mmHg — ABNORMAL HIGH (ref 83–108)

## 2022-10-16 LAB — GLUCOSE, CAPILLARY
Glucose-Capillary: 47 mg/dL — ABNORMAL LOW (ref 70–99)
Glucose-Capillary: 199 mg/dL — ABNORMAL HIGH (ref 70–99)
Glucose-Capillary: 171 mg/dL — ABNORMAL HIGH (ref 70–99)
Glucose-Capillary: 64 mg/dL — ABNORMAL LOW (ref 70–99)
Glucose-Capillary: 84 mg/dL (ref 70–99)

## 2022-10-16 LAB — MAGNESIUM: Magnesium: 2.6 mg/dL — ABNORMAL HIGH (ref 1.7–2.4)

## 2022-10-16 MED ORDER — DEXTROSE 50 % IV SOLN
INTRAVENOUS | Status: AC
  Administered 2022-10-16: 50 mL
  Filled 2022-10-16: qty 50

## 2022-10-16 MED ORDER — HYDROCORTISONE SOD SUC (PF) 100 MG IJ SOLR: 100.0000 mg | Freq: Two times a day (BID) | INTRAMUSCULAR | Status: DC

## 2022-10-16 MED ORDER — CALCIUM GLUCONATE-NACL 2-0.675 GM/100ML-% IV SOLN
2.0000 g | Freq: Once | INTRAVENOUS | Status: AC
  Administered 2022-10-16: 2000 mg via INTRAVENOUS
  Filled 2022-10-16: qty 100

## 2022-10-16 MED ORDER — DEXTROSE 50 % IV SOLN: 12.5000 g | INTRAVENOUS | Status: AC

## 2022-10-16 MED ORDER — VASOPRESSIN 20 UNITS/100 ML INFUSION FOR SHOCK: 0.0000 [IU]/min | INTRAVENOUS | Status: DC

## 2022-10-16 MED ORDER — DEXTROSE 50 % IV SOLN
INTRAVENOUS | Status: AC
  Administered 2022-10-16: 12.5 g via INTRAVENOUS
  Filled 2022-10-16: qty 50

## 2022-10-17 MED FILL — Epinephrine Soln Prefilled Syringe 0.1 MG/10ML (10 MCG/ML): INTRAVENOUS | Qty: 20 | Status: AC

## 2022-10-19 ENCOUNTER — Ambulatory Visit (HOSPITAL_COMMUNITY): Payer: 59

## 2022-10-19 ENCOUNTER — Ambulatory Visit: Payer: 59

## 2022-10-19 LAB — POCT I-STAT 7, (LYTES, BLD GAS, ICA,H+H)
Acid-base deficit: 12 mmol/L — ABNORMAL HIGH (ref 0.0–2.0)
Bicarbonate: 16.9 mmol/L — ABNORMAL LOW (ref 20.0–28.0)
Calcium, Ion: 1.09 mmol/L — ABNORMAL LOW (ref 1.15–1.40)
HCT: 29 % — ABNORMAL LOW (ref 36.0–46.0)
Hemoglobin: 9.9 g/dL — ABNORMAL LOW (ref 12.0–15.0)
O2 Saturation: 93 %
Patient temperature: 98.3
Potassium: 5.4 mmol/L — ABNORMAL HIGH (ref 3.5–5.1)
Sodium: 135 mmol/L (ref 135–145)
TCO2: 18 mmol/L — ABNORMAL LOW (ref 22–32)
pCO2 arterial: 48 mmHg (ref 32–48)
pH, Arterial: 7.153 — CL (ref 7.35–7.45)
pO2, Arterial: 84 mmHg (ref 83–108)

## 2022-10-19 MED FILL — Medication: Qty: 1 | Status: AC

## 2022-10-24 ENCOUNTER — Ambulatory Visit: Payer: 59 | Admitting: Internal Medicine

## 2022-11-06 NOTE — Progress Notes (Signed)
Late entry: Hypoglycemic Event  CBG: 24  Treatment: D50 50 mL (25 gm)  Symptoms: None  Follow-up CBG: Time:0809 CBG Result:88  Possible Reasons for Event: Inadequate meal intake  Comments/MD notified:Hypoglycemic protocol followed and Dr. Vassie Loll notified    Parke Jandreau, [place]

## 2022-11-06 NOTE — Death Summary Note (Signed)
DEATH SUMMARY   Patient Details  Name: Morgan Clay MRN: [id number] DOB: 1946-01-27  Admission/Discharge Information   Admit Date:  10-04-22  Date of Death: Date of Death: 2022/10/18  Time of Death: Time of Death: 11/01/1201  Length of Stay: 2022-10-22  Referring Physician: Esperanza Richters, PA-C   Reason(s) for Hospitalization  S/p PEA cardiac arrest likely in the setting of hypoxia due to aspiration Acute hypoxic/hypercapnic respiratory failure s/p trach Small right-sided pneumothorax Bilateral multifocal pneumonia with Pseudomonas with recurrent aspiration pneumonia leading to severe ARDS Multiple rib fractures in the setting of CPR Mixed metabolic and respiratory acidosis Mixed septic/cardiogenic shock Pulmonary hypertension Rhinoviral lower respiratory tract infection Acute metabolic encephalopathy Diabetes type 2 with recurrent hypoglycemia Liver cirrhosis due to hepatitis C Paroxysmal A-fib End-stage renal disease on CRRT Hyperkalemia Anemia of chronic disease Hyponatremia/hypocalcemia/hyperphosphatemia Thrombocytopenia in the setting of critical illness  Diagnoses  Preliminary cause of death: Septic shock with multisystem organ failure Secondary Diagnoses (including complications and co-morbidities):  Principal Problem:   Acute respiratory failure with hypoxia (HCC) Active Problems:   COPD (chronic obstructive pulmonary disease) (HCC)   Acute right-sided heart failure (HCC)   Lactic acidosis   Acute hypoxemic respiratory failure [facility])   Brief Hospital Course (including significant findings, care, treatment, and services provided and events leading to death)  Morgan Clay is a 77 y.o. year old female who never smoker with history of heart failure, pulmonary hypertension, asthma, ESRD on HD, cirrhosis secondary to hepatitis C s/p Harvoni treatment, atrial fibrillation and esophageal varices/small bowel AVMs who presented to [facility] with shortness of breath  after dialysis, she was tachypneic with an SpO2 on arrival in the ER.    She was noted to have Lactic acid of 7.7 and was given a bolus with repeat lactic acid 7.6. Due to increase work of breathing she was placed on non-rebreather mask. She was given duoneb and solumedrol 125mg  IV. CTA Chest was negative for PE, there was motion artifact but mosaic attenuation of the pulmonary parenchyma. CT Neck showed possible mild pharyngeal mucosal swelling, but again difficult due to motion artifact. Neck CT was done for neck swelling. CT abdomen done for abdominal pain with no acute findings.    PCCM consulted as patient remained tachypneic with increased work of breathing after transfer from non-rebreather to [facility].   10-04-22 presented to [facility], transferred to [facility] 5/28 remains on 12L, stable, viral panel + for rhinovirus/Enterovirus  5/29 lactic improving, surgery following serial abdominal exams, down to 4L [place].  5/30 down to room air. Initial resp failure felt 2/2 rhinovirus pneumonitis. Lactate cleared felt 2/2 generalized multiorgan failure which was slow to clear d/t cirrhosis. Remained encephalopathic  5/31 surgery cleared for diet and signed off. Went to [employer] [facility]. Completed HD was found by family unresponsive. Not initially on tele. Staff called to room. She had no pulse. Rhythm PEA. Underwent 5 minutes of ACLS 1 round epi before ROSC. Did not regain LOC so intubated and transferred to ICU. Copious oral secretions noted during intubation 6/2 extubated and required reintubation later the same day 6/4 tracheostomy placed 6/6 LHC/RHC  No coronary disease. RA 9, PA 48/19 (30) PCWP 12 PA 78% CO 7.3 CI 4.7 PVR 2.5  6/7 hypotensive needing increasing pressors on dialysis 6/8-pressor extravasation, episode of emesis, right femoral central line placed 6/9 patient continued to require high-dose vasopressor support, she developed severe metabolic acidosis with pH 7.0, vent setting was adjusted but despite  that patient continued to  decline 6/10 This morning patient is on 75 mics of Levophed, her pH remain at 7.0 on CRRT with bicarbonate infusion running, goals of care discussion was carried with family, they came at bedside, during that time patient coded and went into PEA cardiac arrest, CPR was initiated per ACLS protocol, after 2 cycles as patient was in multisystem organ failure patient's family decided to stop CPR.  Patient had no pulse and she was declared dead on 11/05/22 at 12:03 PM  Pertinent Labs and Studies  Significant Diagnostic Studies DG Chest Port 1 View  Result Date: Nov 05, 2022 CLINICAL DATA:  77 year old female with history of acute respiratory failure. EXAM: PORTABLE CHEST 1 VIEW COMPARISON:  Chest x-ray 10/15/2022. FINDINGS: A tracheostomy tube is in place with tip 4.1 cm above the carina. A feeding tube is seen extending into the abdomen, however, the tip of the feeding tube extends below the lower margin of the image. Right internal jugular PermCath with tip terminating at the superior cavoatrial junction. Left-sided biventricular pacemaker/AICD with lead tips projecting over the expected location of the right atrium, right ventricle and overlying the left ventricle via the coronary sinus and coronary veins. There is cephalization of the pulmonary vasculature and slight indistinctness of the interstitial markings suggestive of mild pulmonary edema. No definite pleural effusions. No pneumothorax. Mild cardiomegaly. Upper mediastinal contours are within normal limits. Atherosclerotic calcifications are noted in the thoracic aorta. IMPRESSION: 1. Support apparatus, as above. 2. The appearance of the chest is most suggestive of congestive heart failure, similar to the prior study, as above. 3. Aortic atherosclerosis. Electronically Signed   By: Trudie Reed M.D.   On: Nov 05, 2022 07:41   DG Chest Port 1 View  Result Date: 10/15/2022 CLINICAL DATA:  Acute respiratory failure EXAM:  PORTABLE CHEST 1 VIEW COMPARISON:  10/10/2022 FINDINGS: Cardiac shadow is enlarged but stable. Tracheostomy tube, feeding catheter and defibrillator are again noted. Right jugular dialysis catheter is again seen. Central vascular congestion is noted with mild edema. No focal confluent infiltrate is seen. No bony abnormality is noted. IMPRESSION: Changes of CHF. Electronically Signed   By: Alcide Clever M.D.   On: 10/15/2022 09:49   DG Abd 1 View  Result Date: 10/14/2022 CLINICAL DATA:  Vomiting. EXAM: ABDOMEN - 1 VIEW COMPARISON:  10/11/2022. FINDINGS: The bowel gas pattern is normal. An enteric tube terminates in the stomach. Coarse calcifications are noted in the pelvis suggesting degenerating fibroid. Surgical clips are present in the pelvis. IMPRESSION: 1. Nonobstructive bowel-gas pattern. 2. Enteric tube terminates in the stomach. Electronically Signed   By: Thornell Sartorius M.D.   On: 10/14/2022 03:14   CARDIAC CATHETERIZATION  Result Date: 11-01-22   Prox RCA lesion is 30% stenosed.   Mid LAD lesion is 30% stenosed. 1. Filling pressures not significantly elevated. 2. Mild pulmonary hypertension. 3. High cardiac output 4. No significant coronary disease.   DG Abd Portable 1V  Result Date: 10/11/2022 CLINICAL DATA:  Feeding tube placement. EXAM: PORTABLE ABDOMEN - 1 VIEW COMPARISON:  10/07/2022 FINDINGS: Feeding tube tip is in the distal stomach with the tip directed towards the pylorus. Visualized abdomen demonstrates nonspecific gas pattern. IMPRESSION: Feeding tube tip is in the distal stomach directed towards the pylorus. Electronically Signed   By: Kennith Center M.D.   On: 10/11/2022 10:43   DG Chest Port 1 View  Result Date: 10/10/2022 CLINICAL DATA:  Status post tracheostomy EXAM: PORTABLE CHEST 1 VIEW COMPARISON:  Chest x-ray 10/09/2022 FINDINGS: There is a new tracheostomy  with distal tip 3.7 cm above the carina. Left-sided ICD and right-sided central venous catheter are unchanged in  position. The heart is enlarged. Bilateral mid and lower lung airspace opacities persist. There is no pleural effusion or pneumothorax. The osseous structures are stable. IMPRESSION: 1. New tracheostomy with distal tip 3.7 cm above the carina. 2. Persistent bilateral mid and lower lung airspace opacities. Electronically Signed   By: Darliss Cheney M.D.   On: 10/10/2022 16:04   Portable Chest xray  Result Date: 10/09/2022 CLINICAL DATA:  Respiratory failure. EXAM: PORTABLE CHEST 1 VIEW COMPARISON:  10/08/2018 for FINDINGS: ETT tip is stable above the carina. There is a right chest wall dialysis catheter with tips at the superior cavoatrial junction. Enteric tube tip is below the GE junction. Left chest wall ICD noted with leads in the right atrial appendage, coronary sinus and right ventricle. Stable cardiomediastinal contours. Bilateral interstitial and airspace opacities are unchanged in the interval. IMPRESSION: 1. Stable support apparatus. 2. No change in aeration to the lungs compared with previous exam. Electronically Signed   By: Signa Kell M.D.   On: 10/09/2022 07:14   Portable Chest x-ray  Result Date: 10/08/2022 CLINICAL DATA:  Intubated, enteric catheter placement EXAM: PORTABLE CHEST 1 VIEW COMPARISON:  10/08/2022 FINDINGS: Single frontal view of the chest demonstrates endotracheal tube overlying tracheal air column, tip 1.4 cm above carina. Enteric catheter passes below diaphragm, tip projecting over the gastric body. Right internal jugular dialysis catheter tip overlies superior vena cava. Multi lead pacer/AICD unchanged. Cardiac silhouette is enlarged stable. There is persistent central vascular congestion and bilateral perihilar airspace disease most consistent with edema. No effusion or pneumothorax. No acute bony abnormalities. IMPRESSION: 1. Support devices as above. 2. Persistent findings of pulmonary edema and congestive heart failure. Electronically Signed   By: Sharlet Salina M.D.    On: 10/08/2022 19:16   DG Chest Port 1 View  Result Date: 10/08/2022 CLINICAL DATA:  Respiratory distress EXAM: PORTABLE CHEST 1 VIEW COMPARISON:  10/07/2022 FINDINGS: Cardiac pacemaker. Right central venous catheter with tip over the low SVC region. No pneumothorax. Shallow inspiration. Cardiac enlargement with mild pulmonary vascular congestion. Perihilar infiltrates are increasing since prior study, likely edema but could indicate pneumonia or aspiration in the appropriate clinical setting. No pleural effusions. Calcification of the aorta. IMPRESSION: 1. Cardiac enlargement with pulmonary vascular congestion. 2. Increasing perihilar infiltrates. Electronically Signed   By: Burman Nieves M.D.   On: 10/08/2022 18:25   DG CHEST PORT 1 VIEW  Result Date: 10/07/2022 CLINICAL DATA:  Follow-up pneumothorax. EXAM: PORTABLE CHEST 1 VIEW COMPARISON:  Oct 06, 2022 FINDINGS: The right-sided pneumothorax has largely resolved. There may be trace pneumothorax identified laterally in the apex. No left-sided pneumothorax. Stable support apparatus. Improving but persistent infiltrate in the right lung. Left lung is clear. No change in the cardiomediastinal silhouette. IMPRESSION: 1. The right-sided pneumothorax has largely resolved. There may be a trace pneumothorax laterally in the apex. Recommend attention on follow-up. 2. Improving but persistent infiltrate in the right lung. Recommend follow-up to resolution. 3. Stable support apparatus. Electronically Signed   By: Gerome Sam III M.D.   On: 10/07/2022 15:51   ECHOCARDIOGRAM LIMITED  Result Date: 10/07/2022    ECHOCARDIOGRAM LIMITED REPORT   Patient Name:   LAURINA GUIA Date of Exam: 10/07/2022 Medical Rec #:  [id number]         Height:       59.0 in Accession #:    [id number]  Weight:       122.6 lb Date of Birth:  [birth year]         BSA:          1.498 m Patient Age:    76 years          BP:           131/44 mmHg Patient Gender: F                 HR:            90 bpm. Exam Location:  Inpatient Procedure: 2D Echo, Color Doppler and Cardiac Doppler Indications:    Cardiac Arrest  History:        Patient has prior history of Echocardiogram examinations, most                 recent 10/03/2022. CHF, Defibrillator, COPD; Risk                 Factors:Hypertension, Diabetes and Sleep Apnea.  Sonographer:    Irving Burton Senior RDCS Referring Phys: [id number] PETER E BABCOCK IMPRESSIONS  1. Left ventricular ejection fraction, by estimation, is 40 to 45%. Left ventricular ejection fraction by 2D MOD biplane is 40.3 %. The left ventricle has mildly decreased function. The left ventricle demonstrates global hypokinesis.  2. Right ventricular systolic function is mildly reduced. There is moderately elevated pulmonary artery systolic pressure. The estimated right ventricular systolic pressure is 48.2 mmHg.  3. Mild mitral valve regurgitation.  4. Tricuspid valve regurgitation is mild to moderate.  5. The aortic valve is tricuspid. Aortic valve regurgitation is moderate to severe.  6. The inferior vena cava is dilated in size with <50% respiratory variability, suggesting right atrial pressure of 15 mmHg. Comparison(s): No significant change from prior study. Prior images reviewed side by side. FINDINGS  Left Ventricle: Left ventricular ejection fraction, by estimation, is 40 to 45%. Left ventricular ejection fraction by 2D MOD biplane is 40.3 %. The left ventricle has mildly decreased function. The left ventricle demonstrates global hypokinesis. Right Ventricle: Right ventricular systolic function is mildly reduced. There is moderately elevated pulmonary artery systolic pressure. The tricuspid regurgitant velocity is 2.88 m/s, and with an assumed right atrial pressure of 15 mmHg, the estimated right ventricular systolic pressure is 48.2 mmHg. Mitral Valve: Mild mitral valve regurgitation. Tricuspid Valve: Tricuspid valve regurgitation is mild to moderate. Aortic Valve: The aortic valve is  tricuspid. Aortic valve regurgitation is moderate to severe. Aortic regurgitation PHT measures 270 msec. Venous: The inferior vena cava is dilated in size with less than 50% respiratory variability, suggesting right atrial pressure of 15 mmHg. Additional Comments: A device lead is visualized in the right ventricle. Spectral Doppler performed. Color Doppler performed.   LV Volumes (MOD)               Biplane EF (MOD) LV vol d, MOD    89.2 ml       LV Biplane EF:   Left A2C:                                            ventricular LV vol d, MOD    71.1 ml                        ejection A4C:  fraction by LV vol s, MOD    48.4 ml                        2D MOD A2C:                                            biplane is LV vol s, MOD    42.3 ml                        40.3 %. A4C: LV SV MOD A2C:   40.8 ml LV SV MOD A4C:   71.1 ml LV SV MOD BP:    33.2 ml RIGHT VENTRICLE RV S prime:     8.05 cm/s TAPSE (M-mode): 1.4 cm AORTIC VALVE LVOT Vmax:   96.10 cm/s LVOT Vmean:  62.200 cm/s LVOT VTI:    0.146 m AI PHT:      270 msec TRICUSPID VALVE TR Peak grad:   33.2 mmHg TR Vmax:        288.00 cm/s  SHUNTS Systemic VTI: 0.15 m Donato Schultz MD Electronically signed by Donato Schultz MD Signature Date/Time: 10/07/2022/3:34:12 PM    Final    DG Abd Portable 1V  Result Date: 10/07/2022 CLINICAL DATA:  OG tube placement. EXAM: PORTABLE ABDOMEN - 1 VIEW COMPARISON:  Radiograph yesterday. CT 10/03/2022 FINDINGS: The enteric tube has been advanced, the tip and side port are now below the diaphragm in the stomach. Air within nondilated small bowel centrally. Moderate stool in the right colon. Calcified uterine fibroids. IMPRESSION: Enteric tube with tip and side-port below the diaphragm in the stomach. Electronically Signed   By: Narda Rutherford M.D.   On: 10/07/2022 01:10   DG Abd 1 View  Result Date: 10/07/2022 CLINICAL DATA:  Nasogastric tube placement EXAM: ABDOMEN - 1 VIEW COMPARISON:  CT  10/03/2022 FINDINGS: Enteric tube tip projects over the left upper quadrant consistent with location in the upper stomach. Scattered gas and stool in the colon with scattered gas-filled small bowel. No small or large bowel distention. No radiopaque stones appreciated. Calcification in the pelvis consistent with uterine fibroid, better seen on prior CT. Cardiac enlargement. Lung bases are clear. IMPRESSION: 1. Enteric tube tip projects over the upper stomach. 2. Nonobstructive bowel gas pattern. Electronically Signed   By: Burman Nieves M.D.   On: 10/07/2022 00:07   DG CHEST PORT 1 VIEW  Result Date: 10/06/2022 CLINICAL DATA:  Tachypnea EXAM: PORTABLE CHEST 1 VIEW COMPARISON:  10/05/2022 FINDINGS: Pulmonary insufflation is stable. Patchy multifocal airspace infiltrates have progressed within the right mid lung zone and right lung base since prior examination, likely infectious or inflammatory. No pneumothorax or pleural effusion. Right internal jugular hemodialysis catheter tip noted within the superior vena cava. Mild cardiomegaly is stable. Left subclavian pacemaker defibrillator is unchanged. No acute bone abnormality. IMPRESSION: 1. Progressive multifocal pulmonary infiltrate, likely infectious or inflammatory. 2. Stable cardiomegaly. Electronically Signed   By: Helyn Numbers M.D.   On: 10/06/2022 17:16   DG CHEST PORT 1 VIEW  Result Date: 10/06/2022 CLINICAL DATA:  Acute hypoxic respiratory failure EXAM: PORTABLE CHEST 1 VIEW COMPARISON:  10/06/2022 FINDINGS: Endotracheal tube is seen 3.9 cm above the carina. Right internal jugular hemodialysis catheter tip noted at the superior cavoatrial junction. Pulmonary insufflation is stable. There is progressive multifocal airspace infiltrate,  predominantly within the right mid and lower lung zone, likely infectious or inflammatory. Small right apical pneumothorax has developed. No mediastinal shift to suggest tension physiology. No pneumothorax on the left  clear no pleural effusion. Mild cardiomegaly is stable. Left subclavian pacemaker defibrillator is unchanged. No acute bone abnormality. IMPRESSION: 1. Support tubes in appropriate position. 2. Interval development of small right apical pneumothorax. 3. Progressive multifocal airspace infiltrate, predominantly within the right mid and lower lung zone, likely infectious or inflammatory. Electronically Signed   By: Helyn Numbers M.D.   On: 10/06/2022 17:15   CT HEAD WO CONTRAST ( )  Result Date: 10/05/2022 CLINICAL DATA:  Mental status change, unknown cause. EXAM: CT HEAD WITHOUT CONTRAST TECHNIQUE: Contiguous axial images were obtained from the base of the skull through the vertex without intravenous contrast. RADIATION DOSE REDUCTION: This exam was performed according to the departmental dose-optimization program which includes automated exposure control, adjustment of the mA and/or kV according to patient size and/or use of iterative reconstruction technique. COMPARISON:  Head CT 10/02/2015. FINDINGS: Brain: No acute hemorrhage. Cortical gray-white differentiation is preserved. Patchy hypoattenuation of the cerebral white matter, most consistent with moderate chronic small-vessel disease. Prominence of the ventricles and sulci within normal limits for age. No extra-axial collection. No mass effect or midline shift. Vascular: No hyperdense vessel or unexpected calcification. Skull: No calvarial fracture or suspicious bone lesion. Skull base is unremarkable. Sinuses/Orbits: Unremarkable. Other: None. IMPRESSION: 1. No acute intracranial abnormality. 2. Moderate chronic small-vessel disease. Electronically Signed   By: Orvan Falconer M.D.   On: 10/05/2022 16:56   DG Chest Port 1 View  Result Date: 10/05/2022 CLINICAL DATA:  Respiratory failure EXAM: PORTABLE CHEST 1 VIEW COMPARISON:  Chest radiograph dated 09/30/2022 FINDINGS: Lines/tubes: Right internal jugular venous catheter tip projects over the  superior cavoatrial junction. Left chest wall ICD leads project over the right atrium and ventricle and tributary of the coronary sinus. Lungs: Increased scattered bilateral patchy opacities. Pleura: No pneumothorax or pleural effusion. Heart/mediastinum: Similar enlarged cardiomediastinal silhouette. Bones: No acute osseous abnormality. Ill-defined hyperdensity projecting over the left upper quadrant. IMPRESSION: 1. Increased scattered bilateral patchy opacities, likely multifocal infection/inflammation. 2. Ill-defined hyperdensity projecting over the left upper quadrant, which may represent ingested material. Electronically Signed   By: Agustin Cree M.D.   On: 10/05/2022 15:28   CT ABDOMEN PELVIS W CONTRAST  Result Date: 10/03/2022 CLINICAL DATA:  Peritonitis or perforation suspected Pancreatitis, acute, severe EXAM: CT ABDOMEN AND PELVIS WITH CONTRAST TECHNIQUE: Multidetector CT imaging of the abdomen and pelvis was performed using the standard protocol following bolus administration of intravenous contrast. RADIATION DOSE REDUCTION: This exam was performed according to the departmental dose-optimization program which includes automated exposure control, adjustment of the mA and/or kV according to patient size and/or use of iterative reconstruction technique. CONTRAST:  75mL OMNIPAQUE IOHEXOL 350 MG/ML SOLN COMPARISON:  CT abdomen pelvis 09/25/2022, CT abdomen pelvis 07/05/2022 FINDINGS: Lower chest: Patchy airspace opacities of the visualized right lung. Cardiomegaly. Hepatobiliary: Slight nodular contour of the liver. No focal liver abnormality. Vicarious excretion previously administered intravenous contrast. No gallbladder wall thickening or pericholecystic fluid. No biliary dilatation. Pancreas: Diffusely atrophic. No focal lesion. Otherwise normal pancreatic contour. No surrounding inflammatory changes. No main pancreatic ductal dilatation. Spleen: Normal in size without focal abnormality.  Splenule noted.  Adrenals/Urinary Tract: No adrenal nodule bilaterally. Question slightly striated nephrogram bilaterally. No hydronephrosis. No hydroureter.  No nephroureterolithiasis. The urinary bladder is unremarkable. No excretion of intravenous contrast from either kidneys on  delayed view. Stomach/Bowel: Stomach is within normal limits. No evidence of bowel wall thickening or dilatation. No pneumatosis. The appendix is not definitely identified with no inflammatory changes in the right lower quadrant to suggest acute appendicitis. Vascular/Lymphatic: No abdominal aorta or iliac aneurysm. Mild to moderate atherosclerotic plaque of the aorta and its branches. No abdominal, pelvic, or inguinal lymphadenopathy. Reproductive: A 5 cm Coarsely calcified lesion along the anterior uterine wall suggestive of a degenerative uterine fibroid. Several calcifications within the uterus likely fibroids as well. Otherwise uterus and bilateral adnexa are unremarkable. Question tubal ligation. Other: No intraperitoneal free fluid. No intraperitoneal free gas. No organized fluid collection. Musculoskeletal: Redemonstration of a left inguinal hernia with interval herniation of a short loop of small bowel within the hernia. Associated abdominal defect 1.6 cm. No suspicious lytic or blastic osseous lesions. No acute displaced fracture. Multilevel degenerative changes of the spine. IMPRESSION: 1. Interval development of right lung pneumonia. COVID-19 infection not excluded. 2. Question slightly striated nephrogram bilaterally. No excretion of intravenous contrast from either kidneys delayed view. Recommend correlation with urinalysis. 3. Left inguinal hernia with interval herniation of a short loop of small bowel within the hernia. No findings to suggest definite ischemia or obstruction. Recommend clinical correlation for incarceration. 4. Cirrhosis with no definite findings of portal hypertension. No focal liver lesions identified. Please note that  liver protocol enhanced MR and CT are the most sensitive tests for the screening detection of hepatocellular carcinoma in the high risk setting of cirrhosis. 5.  Aortic Atherosclerosis (ICD10-I70.0). Electronically Signed   By: Tish Frederickson M.D.   On: 10/03/2022 17:21   ECHOCARDIOGRAM COMPLETE  Result Date: 10/03/2022    ECHOCARDIOGRAM REPORT   Patient Name:   MARCIEL BRASHEARS Date of Exam: 10/03/2022 Medical Rec #:  [id number]         Height:       59.0 in Accession #:    [id number]        Weight:       131.2 lb Date of Birth:  1945/09/11         BSA:          1.542 m Patient Age:    76 years          BP:           123/71 mmHg Patient Gender: F                 HR:           86 bpm. Exam Location:  Inpatient Procedure: 2D Echo, Color Doppler and Cardiac Doppler Indications:    Elevated Troponin  History:        Patient has prior history of Echocardiogram examinations, most                 recent 06/24/2020. Pulmonary HTN, Arrythmias:LBBB,                 Signs/Symptoms:Dyspnea; Risk Factors:Diabetes and Hypertension.  Sonographer:    Darlys Gales Referring Phys: [phone] LAURA R GLEASON IMPRESSIONS  1. Left ventricular ejection fraction, by estimation, is 40 to 45%. The left ventricle has mildly decreased function. The left ventricle demonstrates global hypokinesis. There is moderate concentric left ventricular hypertrophy. Left ventricular diastolic function could not be evaluated. There is the interventricular septum is flattened in systole and diastole, consistent with right ventricular pressure and volume overload.  2. Right ventricular systolic function is mildly reduced. The right ventricular size is moderately enlarged. There is  severely elevated pulmonary artery systolic pressure. The estimated right ventricular systolic pressure is 74.3 mmHg.  3. Left atrial size was moderately dilated.  4. Right atrial size was mildly dilated.  5. The mitral valve is grossly normal. Mild to moderate mitral valve  regurgitation. No evidence of mitral stenosis.  6. The tricuspid valve is abnormal. Tricuspid valve regurgitation is severe.  7. The aortic valve is tricuspid. Aortic valve regurgitation is moderate. No aortic stenosis is present.  8. The inferior vena cava is dilated in size with <50% respiratory variability, suggesting right atrial pressure of 15 mmHg. Comparison(s): Changes from prior study are noted. LVEF 40-45% which is slightly reduced from 50-55%. Severe TR. AI remains moderate. FINDINGS  Left Ventricle: Left ventricular ejection fraction, by estimation, is 40 to 45%. The left ventricle has mildly decreased function. The left ventricle demonstrates global hypokinesis. The left ventricular internal cavity size was normal in size. There is  moderate concentric left ventricular hypertrophy. The interventricular septum is flattened in systole and diastole, consistent with right ventricular pressure and volume overload. Left ventricular diastolic function could not be evaluated due to nondiagnostic images. Left ventricular diastolic function could not be evaluated. Right Ventricle: The right ventricular size is moderately enlarged. No increase in right ventricular wall thickness. Right ventricular systolic function is mildly reduced. There is severely elevated pulmonary artery systolic pressure. The tricuspid regurgitant velocity is 3.85 m/s, and with an assumed right atrial pressure of 15 mmHg, the estimated right ventricular systolic pressure is 74.3 mmHg. Left Atrium: Left atrial size was moderately dilated. Right Atrium: Right atrial size was mildly dilated. Pericardium: There is no evidence of pericardial effusion. Mitral Valve: The mitral valve is grossly normal. Mild to moderate mitral valve regurgitation. No evidence of mitral valve stenosis. Tricuspid Valve: The tricuspid valve is abnormal. Tricuspid valve regurgitation is severe. No evidence of tricuspid stenosis. The flow in the hepatic veins is  reversed during ventricular systole. Aortic Valve: The aortic valve is tricuspid. Aortic valve regurgitation is moderate. Aortic regurgitation PHT measures 376 msec. No aortic stenosis is present. Aortic valve mean gradient measures 7.0 mmHg. Aortic valve peak gradient measures 13.4 mmHg. Aortic valve area, by VTI measures 1.05 cm. Pulmonic Valve: The pulmonic valve was grossly normal. Pulmonic valve regurgitation is not visualized. No evidence of pulmonic stenosis. Aorta: The aortic root is normal in size and structure. Venous: The inferior vena cava is dilated in size with less than 50% respiratory variability, suggesting right atrial pressure of 15 mmHg. IAS/Shunts: The atrial septum is grossly normal. Additional Comments: A device lead is visualized in the right atrium and right ventricle.  LEFT VENTRICLE PLAX 2D LVIDd:         3.60 cm   Diastology LVIDs:         2.60 cm   LV e' medial:    6.85 cm/s LV PW:         1.40 cm   LV E/e' medial:  14.5 LV IVS:        1.30 cm   LV e' lateral:   8.38 cm/s LVOT diam:     1.80 cm   LV E/e' lateral: 11.8 LV SV:         31 LV SV Index:   20 LVOT Area:     2.54 cm  RIGHT VENTRICLE RV S prime:     13.40 cm/s TAPSE (M-mode): 1.6 cm LEFT ATRIUM             Index  RIGHT ATRIUM           Index LA Vol (A2C):   49.2 ml 31.91 ml/m  RA Area:     20.60 cm LA Vol (A4C):   72.0 ml 46.70 ml/m  RA Volume:   58.70 ml  38.08 ml/m LA Biplane Vol: 63.8 ml 41.38 ml/m  AORTIC VALVE AV Area (Vmax):    1.05 cm AV Area (Vmean):   0.96 cm AV Area (VTI):     1.05 cm AV Vmax:           183.00 cm/s AV Vmean:          123.000 cm/s AV VTI:            0.292 m AV Peak Grad:      13.4 mmHg AV Mean Grad:      7.0 mmHg LVOT Vmax:         75.80 cm/s LVOT Vmean:        46.400 cm/s LVOT VTI:          0.120 m LVOT/AV VTI ratio: 0.41 AI PHT:            376 msec MITRAL VALVE               TRICUSPID VALVE MV Area (PHT): 2.97 cm    TR Peak grad:   59.3 mmHg MV Decel Time: 255 msec    TR Vmax:         385.00 cm/s MV E velocity: 99.00 cm/s MV A velocity: 59.20 cm/s  SHUNTS MV E/A ratio:  1.67        Systemic VTI:  0.12 m                            Systemic Diam: 1.80 cm Lennie Odor MD Electronically signed by Lennie Odor MD Signature Date/Time: 10/03/2022/3:10:04 PM    Final    US Abdomen Limited RUQ (LIVER/GB)  Result Date: 10/03/2022 CLINICAL DATA:  Abnormal liver enzymes EXAM: ULTRASOUND ABDOMEN LIMITED RIGHT UPPER QUADRANT COMPARISON:  09/26/2022 FINDINGS: Gallbladder: Gallstones: None Sludge: None Gallbladder Wall: Within normal limits Pericholecystic fluid: None Sonographic Murphy's Sign: Negative per technologist Common bile duct: Diameter: 3 mm Liver: Parenchymal echogenicity: Diffusely increased Contours: Mildly nodular Lesions: None Portal vein: Patent.  Hepatopetal flow Other: None. IMPRESSION: 1. Nodular hepatic contours, suspicious for cirrhosis. 2. Increased echogenicity of the hepatic parenchyma is a nonspecific indicator of hepatocellular dysfunction, most commonly steatosis. Electronically Signed   By: Acquanetta Belling M.D.   On: 10/03/2022 08:52   CT SOFT TISSUE NECK WO CONTRAST  Result Date: 31-Oct-2022 CLINICAL DATA:  Neck swelling EXAM: CT NECK WITHOUT CONTRAST TECHNIQUE: Multidetector CT imaging of the neck was performed following the standard protocol without intravenous contrast. RADIATION DOSE REDUCTION: This exam was performed according to the departmental dose-optimization program which includes automated exposure control, adjustment of the mA and/or kV according to patient size and/or use of iterative reconstruction technique. COMPARISON:  None Available. FINDINGS: Pharynx and larynx: Evaluation is somewhat limited by motion artifact. No definite mass possible mild pharyngeal mucosal swelling. No significant airway narrowing. Salivary glands: No inflammation, mass, or stone. Thyroid: Normal. Lymph nodes: None enlarged or abnormal density. Vascular: Evaluation is limited by the  absence of intravenous contrast. Within this limitation, no vascular abnormality. Right IJ catheter. Limited intracranial: Negative. Visualized orbits: No acute finding. Status post bilateral lens replacements. Mastoids and visualized paranasal sinuses: Clear. Skeleton: No acute osseous  abnormality. Degenerative changes in the cervical spine. Upper chest: Evaluation is limited by motion and beam hardening artifact. Within this limitation, no focal pulmonary opacity or pleural effusion. Left chest cardiac device Other: None. IMPRESSION: Evaluation is somewhat limited by motion artifact and the absence of intravenous contrast. Possible mild pharyngeal mucosal swelling, which could represent pharyngitis in the appropriate clinical setting. No significant airway narrowing. Electronically Signed   By: Wiliam Ke M.D.   On: 10/01/2022 19:07   CT Angio Abd/Pel w/ and/or w/o  Result Date: 09/18/2022 CLINICAL DATA:  Abdominal pain EXAM: CTA ABDOMEN AND PELVIS WITHOUT AND WITH CONTRAST TECHNIQUE: Multidetector CT imaging of the abdomen and pelvis was performed using the standard protocol during bolus administration of intravenous contrast. Multiplanar reconstructed images and MIPs were obtained and reviewed to evaluate the vascular anatomy. RADIATION DOSE REDUCTION: This exam was performed according to the departmental dose-optimization program which includes automated exposure control, adjustment of the mA and/or kV according to patient size and/or use of iterative reconstruction technique. CONTRAST:  80mL OMNIPAQUE IOHEXOL 350 MG/ML SOLN COMPARISON:  07/05/2022 FINDINGS: VASCULAR Normal contour and caliber of the abdominal aorta. No evidence of aneurysm, dissection, or other acute aortic pathology. Standard branching pattern of the abdominal aorta, with solitary bilateral renal arteries. Moderate mixed calcific atherosclerosis. Review of the MIP images confirms the above findings. NON-VASCULAR Lower chest: Please see  separately reported same day examination of the chest. Cardiomegaly. Hepatobiliary: No solid liver abnormality is seen. Coarse contour of the liver. Status post cholecystectomy. Pancreas: Unremarkable. No pancreatic ductal dilatation or surrounding inflammatory changes. Spleen: Normal in size without significant abnormality. Adrenals/Urinary Tract: Adrenal glands are unremarkable. Kidneys are normal, without renal calculi, solid lesion, or hydronephrosis. Bladder is unremarkable. Stomach/Bowel: Stomach is within normal limits. Appendix not clearly visualized and may be surgically absent. No evidence of bowel wall thickening, distention, or inflammatory changes. Vascular/Lymphatic: No significant vascular findings are present. No enlarged abdominal or pelvic lymph nodes. Reproductive: Calcified uterine fibroids. Other: Small left inguinal hernia containing fat and fluid (series 4, image 11). No ascites. Musculoskeletal: No acute or significant osseous findings. IMPRESSION: 1. Normal contour and caliber of the abdominal aorta. No evidence of aneurysm, dissection, or other acute aortic pathology. Moderate mixed calcific atherosclerosis. 2. No acute CT findings of the abdomen or pelvis to explain abdominal pain. 3. Coarse contour of the liver, suggestive of cirrhosis. Correlate with biochemical findings. 4. Small left inguinal hernia containing fat and fluid. 5. Uterine fibroids. Aortic Atherosclerosis (ICD10-I70.0). Electronically Signed   By: Jearld Lesch M.D.   On: 09/30/2022 19:03   CT Angio Chest PE W and/or Wo Contrast  Result Date: 09/15/2022 CLINICAL DATA:  Pulmonary embolism (PE) suspected, high prob EXAM: CT ANGIOGRAPHY CHEST WITH CONTRAST TECHNIQUE: Multidetector CT imaging of the chest was performed using the standard protocol during bolus administration of intravenous contrast. Multiplanar CT image reconstructions and MIPs were obtained to evaluate the vascular anatomy. RADIATION DOSE REDUCTION: This  exam was performed according to the departmental dose-optimization program which includes automated exposure control, adjustment of the mA and/or kV according to patient size and/or use of iterative reconstruction technique. CONTRAST:  75mL OMNIPAQUE IOHEXOL 350 MG/ML SOLN COMPARISON:  Chest radiograph 09/30/2022 FINDINGS: Cardiovascular: Breathing motion artifact limitations. Allowing for this, no evidence of pulmonary embolus. Right-sided dialysis catheter tip in the distal SVC. Left-sided pacemaker in place. Multi chamber cardiomegaly. Contrast refluxes a prominently into the hepatic veins and IVC. No pericardial effusion. Atherosclerosis of the thoracic aorta, no aneurysm.  Mediastinum/Nodes: Subcarinal lymph node measures 15 mm. Additional shotty mediastinal lymph nodes. There is no hilar adenopathy. Patulous esophagus. Lungs/Pleura: Motion artifact limitations. Mosaic attenuation of the pulmonary parenchyma. Minor subsegmental atelectasis in the lingula. No features of pulmonary edema. No pleural fluid. Trachea and central airways are clear. Upper Abdomen: Stranding in the right upper quadrant medial to the liver, etiology uncertain. Musculoskeletal: There are no acute or suspicious osseous abnormalities. Review of the MIP images confirms the above findings. IMPRESSION: 1. No pulmonary embolus allowing for motion artifact limitations. 2. Multi chamber cardiomegaly with reflux of contrast into the hepatic veins and IVC consistent with elevated right heart pressures. 3. Mosaic attenuation of the pulmonary parenchyma, can be seen with small airways disease or small vessel disease. 4. Stranding in the right upper quadrant medial to the liver, only partially included in the field of view, etiology indeterminate. Aortic Atherosclerosis (ICD10-I70.0). Electronically Signed   By: Narda Rutherford M.D.   On: 09/19/2022 18:04   DG Chest Portable 1 View  Result Date: 09/30/2022 CLINICAL DATA:  Continued cough. Was  seen 2 days ago for the same. Had dialysis yesterday. EXAM: PORTABLE CHEST 1 VIEW COMPARISON:  Chest radiographs 09/28/2022 (multiple studies) and 06/07/2022 FINDINGS: Right internal jugular dual-lumen central venous catheter is again seen with tip overlying the central superior vena cava. Cardiac silhouette is moderately to markedly enlarged, unchanged. Moderate atherosclerotic calcifications overlying the aortic arch. Left chest wall cardiac AICD with leads overlying the right atrium, right ventricle, and coronary sinus. There is again mild central vascular prominence. No overt pulmonary edema. No pleural effusion or pneumothorax. No acute skeletal abnormality. IMPRESSION: Cardiomegaly with mild central vascular prominence, unchanged. No overt pulmonary edema. No significant change from prior exam. Electronically Signed   By: Neita Garnet M.D.   On: 09/30/2022 15:33   DG Chest Portable 1 View  Result Date: 09/28/2022 CLINICAL DATA:  Short of breath and cough for 1 week, concern for CHF EXAM: PORTABLE CHEST 1 VIEW COMPARISON:  09/28/2022 FINDINGS: Single frontal view of the chest demonstrates stable multi lead pacer/AICD and right dialysis catheter. Cardiac silhouette remains enlarged. Persistent central vascular prominence with bilateral interstitial and ground-glass opacities. No effusion or pneumothorax. No acute bony abnormalities. IMPRESSION: 1. Stable findings most consistent with congestive heart failure. No change in volume status. Electronically Signed   By: Sharlet Salina M.D.   On: 09/28/2022 19:28   DG Chest 2 View  Result Date: 09/28/2022 CLINICAL DATA:  Two week history of cough and shortness of breath EXAM: CHEST - 2 VIEW COMPARISON:  06/07/2022 FINDINGS: Chronic cardiomegaly and aortic atherosclerosis. Pacemaker/AICD with leads apparently unchanged. Right internal jugular central venous catheter tip in the SVC above the right atrium. Radiopaque sheath like density projects over the heart on  the frontal view and appears to be extra thoracic on the lateral view, nature uncertain. There is pulmonary venous hypertension with mild interstitial edema. No evidence of consolidation, collapse or visible effusion. Mild pneumonia not excluded, but congestive heart failure/fluid overload is favored. IMPRESSION: 1. Congestive heart failure/fluid overload pattern with pulmonary venous hypertension and mild interstitial edema. Mild pneumonia not excluded, but no evidence of dense consolidation, lobar collapse or effusion. 2. Chronic cardiomegaly and aortic atherosclerosis. Electronically Signed   By: Paulina Fusi M.D.   On: 09/28/2022 16:26   US Abdomen Limited RUQ (LIVER/GB)  Result Date: 09/26/2022 CLINICAL DATA:  Cirrhosis EXAM: ULTRASOUND ABDOMEN LIMITED RIGHT UPPER QUADRANT COMPARISON:  CT abdomen pelvis 07/05/2022 FINDINGS: Gallbladder: No gallstones  or wall thickening visualized. No sonographic Murphy sign noted by sonographer. Common bile duct: Diameter: 3.2 mm Liver: Coarsened echogenicity. No focal lesion. Portal vein is patent on color Doppler imaging with normal direction of blood flow towards the liver. Other: None. IMPRESSION: 1. No cholelithiasis or sonographic evidence for acute cholecystitis. 2. Coarsened echogenicity of the liver. No focal lesion. Electronically Signed   By: Annia Belt M.D.   On: 09/26/2022 12:49    Microbiology Recent Results (from the past 240 hour(s))  Culture, Respiratory w Gram Stain     Status: None   Collection Time: 10/06/22  3:15 PM   Specimen: Tracheal Aspirate; Respiratory  Result Value Ref Range Status   Specimen Description TRACHEAL ASPIRATE  Final   Special Requests NONE  Final   Gram Stain   Final    FEW WBC PRESENT, PREDOMINANTLY PMN RARE BUDDING YEAST SEEN Performed at [facility], [address] [place], [place] [address]    Culture   Final    RARE MOLD RARE PSEUDOMONAS AERUGINOSA FUNGUS (MOLD) ISOLATED, PROBABLE CONTAMINANT/COLONIZER  (SAPROPHYTE). CONTACT MICROBIOLOGY IF FURTHER IDENTIFICATION REQUIRED [id number].    Report Status 10/09/2022 FINAL  Final   Organism ID, Bacteria PSEUDOMONAS AERUGINOSA  Final      Susceptibility   Pseudomonas aeruginosa - MIC*    CEFTAZIDIME 2 SENSITIVE Sensitive     CIPROFLOXACIN <=0.25 SENSITIVE Sensitive     GENTAMICIN 2 SENSITIVE Sensitive     IMIPENEM 2 SENSITIVE Sensitive     PIP/TAZO 8 SENSITIVE Sensitive     CEFEPIME 2 SENSITIVE Sensitive     * RARE PSEUDOMONAS AERUGINOSA    Lab Basic Metabolic Panel: Recent Labs  Lab 10/13/22 0700 10/14/22 0653 10/14/22 1152 10/15/22 0342 10/15/22 0918 10/15/22 1405 10/15/22 1620 10/15/22 1814 10/25/2022 0252 10/23/2022 0315 10/30/2022 0815  NA 132*   < > 132* 132*   < > 137 133* 131* 129* 131* 127*  K 4.4   < > 4.1 6.0*   < > 5.6* 5.5* 5.2* 4.8 4.9 4.9  CL 91*   < > 91* 92*  --  94* 93*  --   --  87*  --   CO2 22   < > 24 15*  --  12* 12*  --   --  15*  --   GLUCOSE 163*   < > 134* 133*  --  94 77  --   --  53*  --   BUN 53*   < > 27* 36*  --  37* 32*  --   --  18  --   CREATININE 5.33*   < > 4.09* 5.11*  --  4.70* 4.27*  --   --  2.53*  --   CALCIUM 8.6*   < > 8.8* 8.9  --  8.8* 8.8*  --   --  7.3*  --   MG  --   --   --   --   --   --   --   --   --  2.6*  --   PHOS 3.5  --   --   --   --   --  7.6*  --   --  6.8*  --    < > = values in this interval not displayed.   Liver Function Tests: Recent Labs  Lab 10/10/22 0422 10/11/22 0236 Oct 15, 2022 0234 10/13/22 0700 10/13/22 1013 10/14/22 1152 10/15/22 1620 10/07/2022 0315  AST 205* 207* 186*  --  209* 202*  --   --   ALT 249* 229* 171*  --  159* 124*  --   --   ALKPHOS 107 123 132*  --  161* 145*  --   --   BILITOT 9.6* 11.2* 12.0*  --  15.0* 14.6*  --   --   PROT 6.8 6.9 5.9*  --  7.8 7.1  --   --   ALBUMIN 2.4* 2.2* 1.9* 1.8* 2.6* 2.4* 2.0* 1.9*   No results for input(s): "LIPASE", "AMYLASE" in the last 168 hours. No results for input(s): "AMMONIA" in the last 168  hours. CBC: Recent Labs  Lab 10/13/22 0114 10/13/22 0700 10/14/22 0653 10/15/22 0342 10/15/22 0918 10/15/22 1814 10/18/2022 0252 11/01/2022 0315 10/30/2022 0815  WBC 17.9* 16.4* 16.6* 22.6*  --   --   --  16.1*  --   NEUTROABS  --   --   --   --   --   --   --  13.0*  --   HGB 10.6* 9.9* 9.1* 9.0* 10.9* 10.2* 11.2* 8.3* 9.9*  HCT 32.3* 29.6* 28.7* 29.1* 32.0* 30.0* 33.0* 28.5* 29.0*  MCV 92.6 90.5 93.2 96.7  --   --   --  102.5*  --   PLT 125* 121* 133* 120*  --   --   --  75*  --    Cardiac Enzymes: No results for input(s): "CKTOTAL", "CKMB", "CKMBINDEX", "TROPONINI" in the last 168 hours. Sepsis Labs: Recent Labs  Lab 10/13/22 0700 10/14/22 0653 10/15/22 0342 10/25/2022 0315  WBC 16.4* 16.6* 22.6* 16.1*    Procedures/Operations    Brynli Ollis 10/21/2022, 12:23 PM

## 2022-11-06 NOTE — Progress Notes (Signed)
PT Cancellation Note  Patient Details Name: Morgan Clay MRN: [id number] DOB: [birth year]   Cancelled Treatment:    Reason Eval/Treat Not Completed: Medical issues which prohibited therapy (Pt on comfort care per nurse. Sign off.)   [name] 11/05/2022, 10:15 AM [name] M,PT Acute Rehab Services [phone]

## 2022-11-06 NOTE — Progress Notes (Signed)
Hypoglycemic Event  CBG: 64  Treatment: D50 25 mL (12.5 gm)  Symptoms: None  Follow-up CBG: Time:0808 CBG Result:171  Possible Reasons for Event: Inadequate meal intake  Comments/MD notified:Hypoglycemic protocol followed    Morgan Clay

## 2022-11-06 NOTE — Procedures (Signed)
Cardiopulmonary Resuscitation Note  Morgan Clay  [id number]  [birth year]  Date:10/30/2022  Time:12:10 PM   Provider Performing:Shravya Wickwire   Procedure: Cardiopulmonary Resuscitation [id number])  Indication(s) Loss of Pulse  Consent N/A  Anesthesia N/A   Time Out N/A   Sterile Technique Hand hygiene, gloves   Procedure Description Called to patient's room for CODE BLUE. Initial rhythm was PEA/Asystole. Patient received high quality chest compressions for 6 minutes with defibrillation or cardioversion when appropriate. Epinephrine was administered every 3 minutes as directed by time Biomedical engineer.  Return of spontaneous circulation was not achieved.  Family at bedside.   Complications/Tolerance N/A   EBL N/A   Specimen(s) N/A

## 2022-11-06 NOTE — Progress Notes (Signed)
Pt asystole on heart monitor. Family and MD called to bedside. CPR initiated.  CPR stopped per family request. Pt pronounced by Dr. Cheri Fowler [phone]

## 2022-11-06 NOTE — Progress Notes (Signed)
   10/14/2022 1220  Spiritual Encounters  Type of Visit Initial  Care provided to: Pt and family  Conversation partners present during encounter Nurse  Referral source Code page  Reason for visit Code  OnCall Visit No   Chaplain responding to Code blue arrived to find PT was already passed. PT's family and friends were at the bedside weeping and supporting one another.  Chaplain was able to introduce his presence and family member shared with me the PT's name and her relationship to the others in the room.  Chaplain provided compassionate presence to PT's daughter and also PT's granddaughter.  Chaplain also spoke with a family friend at bedside who communicated that PT was a woman of deep Christian faith.  Father in the room asked chaplain to provide prayer.  Chaplain took PT's hand in his and prayed.  Chaplain remained a brief time more and then dismissed himself to allow the family their space to grieve. Chaplain services remain available by page.

## 2022-11-06 NOTE — Progress Notes (Signed)
  Chart reviewed.   Patient with worsening shock despite increasing pressor support. Likely due to overwhelming sepsis.   This is likely an unsurvivable situation.   At this point, the AHF team unfortunately has little left to offer.   We will s/o. Please call with questions.   Arvilla Meres, MD  9:27 AM

## 2022-11-06 NOTE — Progress Notes (Addendum)
Late entry: Hypoglycemic Event  CBG: 69  Treatment: D50 25 mL (12.5 gm)  Symptoms: None  Follow-up CBG: Time:1227 CBG Result:127  Possible Reasons for Event: Inadequate meal intake  Comments/MD notified:Hypoglycemic protocol followed    Theodoro Parma

## 2022-11-06 NOTE — Progress Notes (Signed)
NAME:  Morgan Clay, MRN:  [id number], DOB:  1946-03-01, LOS: 14 ADMISSION DATE:  09/16/2022, CONSULTATION DATE:  10/03/2022 REFERRING MD:  Ernie Avena, MD CHIEF COMPLAINT:  Respiratory Failure  History of Present Illness:  Morgan Clay is a 77 year old woman, never smoker with history of heart failure, pulmonary hypertension, asthma, ESRD on HD, cirrhosis secondary to hepatitis C s/p Harvoni treatment, atrial fibrillation and esophageal varices/small bowel AVMs who presented to [facility] with shortness of breath after dialysis, she was tachypneic with an SpO2 on arrival in the ER.   She was noted to have Lactic acid of 7.7 and was given a bolus with repeat lactic acid 7.6. Due to increase work of breathing she was placed on non-rebreather mask. She was given duoneb and solumedrol 125mg  IV. CTA Chest was negative for PE, there was motion artifact but mosaic attenuation of the pulmonary parenchyma. CT Neck showed possible mild pharyngeal mucosal swelling, but again difficult due to motion artifact. Neck CT was done for neck swelling. CT abdomen done for abdominal pain with no acute findings.   PCCM consulted as patient remained tachypneic with increased work of breathing after transfer from non-rebreather to [facility].   Patient accepted in transfer from [facility] to [facility] ICU.  Pertinent  Medical History   Asthma Pulmonary Hypertension OSA Chronic Systolic Heart Failure Atrial Fibrillation DMII ESRD on HD Cirrhosis Hepatitis C s/p Harvoni Esophageal Varices Duodenal AVM  Significant Hospital Events: Including procedures, antibiotic start and stop dates in addition to other pertinent events   5/27 presented to [facility], transferred to [facility] 5/28 remains on 12L, stable, viral panel + for rhinovirus/Enterovirus  5/29 lactic improving, surgery following serial abdominal exams, down to 4L [place].  5/30 down to room air. Initial resp failure  felt 2/2 rhinovirus pneumonitis. Lactate cleared felt 2/2 generalized multiorgan failure which was slow to clear d/t cirrhosis. Remained encephalopathic  5/31 surgery cleared for diet and signed off. Went to [employer] [facility]. Completed HD was found by family unresponsive. Not initially on tele. Staff called to room. She had no pulse. Rhythm PEA. Underwent 5 minutes of ACLS 1 round epi before ROSC. Did not regain LOC so intubated and transferred to ICU. Copious oral secretions noted during intubation 6/2 extubated and required reintubation later the same day 6/4 tracheostomy placed 6/6 LHC/RHC  No coronary disease. RA 9, PA 48/19 (30) PCWP 12 PA 78% CO 7.3 CI 4.7 PVR 2.5  6/7 hypotensive needing increasing pressors on dialysis 6/8-pressor extravasation, episode of emesis, right femoral central line placed  Interim History / Subjective:  Critically ill, trach Remains severely acidotic with pH 7.0, vasopressor requirement continued to trend up, currently on 36 mics of Levophed At recurrent hypoglycemia currently on D10 infusion  Objective   Blood pressure (!) 77/29, pulse 61, temperature 98.6 F (37 C), temperature source Axillary, resp. rate (!) 25, height 4' 11.02" (1.499 m), weight 62.9 kg, SpO2 95 %.    Vent Mode: PRVC FiO2 (%):  [35 %-100 %] 100 % Set Rate:  [30 bmp] 30 bmp Vt Set:  [350 mL] 350 mL PEEP:  [5 cmH20] 5 cmH20 Plateau Pressure:  [21 cmH20-22 cmH20] 22 cmH20   Intake/Output Summary (Last 24 hours) at 11/04/2022 0801 Last data filed at 10/15/2022 0700 Gross per 24 hour  Intake 1660 ml  Output 1213.5 ml  Net 446.5 ml   Filed Weights   10/14/22 0500 10/15/22 0500 11/03/2022 0500  Weight: 61.8 kg  60.9 kg 62.9 kg     Physical exam: General: Crtitically ill-appearing female, orally intubated HEENT: [place]/AT, eyes anicteric.  S/p trach Neuro: Eyes closed, does not open, not following commands, pupils fixed and mid dilated, absent corneal, cough, she triggers ventilator Chest: Bilateral  basal crackles, no wheezes or rhonchi Heart: Paced rhythm with heart rate in 60s, no murmurs or gallops Abdomen: Soft, nondistended, sluggish bowel sounds present Skin: No rash   Labs and images were reviewed  Resolved Hospital Problem list     Lactic acidosis Hyperkalemia Traumatic pneumothorax  Assessment & Plan:  S/p PEA cardiac arrest likely in the setting of hypoxia due to aspiration Acute hypoxic/hypercapnic respiratory failure s/p trach Small right-sided pneumothorax Bilateral multifocal pneumonia with Pseudomonas with recurrent aspiration pneumonia leading to severe ARDS Multiple rib fractures in the setting of CPR Mixed metabolic and respiratory acidosis Mixed septic/cardiogenic shock Pulmonary hypertension Rhinoviral lower respiratory tract infection Acute metabolic encephalopathy Diabetes type 2 with recurrent hypoglycemia Liver cirrhosis due to hepatitis C Paroxysmal A-fib End-stage renal disease on CRRT Hyperkalemia Anemia of chronic disease Hyponatremia/hypocalcemia/hyperphosphatemia Thrombocytopenia in the setting of critical illness  Patient's overall clinical condition became worse since yesterday Currently on 36 mics of levo She was started on CRRT yesterday due to severe metabolic acidosis and hypotension She has resistant metabolic acidosis with pH 7.0 Vent setting was adjusted to clear hypercapnia Unfortunately clinical condition is deteriorating Her neurological exam is worse today with absent pupillary, corneal, cough and gag reflexes Patient's daughter was updated at bedside, continue goals of care discussion as unfortunately patient is in multisystem organ failure with minimal chances of meaningful recovery  Best Practice (right click and "Reselect all SmartList Selections" daily)   Diet/type: NPO w/ meds via tube DVT prophylaxis: SCD GI prophylaxis: N/A Lines: N/A Foley:  N/A Code Status:  full code Last date of multidisciplinary goals of  care discussion [daughter updated at bedside 6/10]   The patient is critically ill due to septic shock, acute hypoxic/hypercapnic respiratory failure.  Critical care was necessary to treat or prevent imminent or life-threatening deterioration.  Critical care was time spent personally by me on the following activities: development of treatment plan with patient and/or surrogate as well as nursing, discussions with consultants, evaluation of patient's response to treatment, examination of patient, obtaining history from patient or surrogate, ordering and performing treatments and interventions, ordering and review of laboratory studies, ordering and review of radiographic studies, pulse oximetry, re-evaluation of patient's condition and participation in multidisciplinary rounds.   During this encounter critical care time was devoted to patient care services described in this note for 44 minutes.   Cheri Fowler, MD [facility] See Amion for pager If no response to pager, please call [phone] until 7pm After 7pm, Please call E-link [phone]

## 2022-11-06 DEATH — deceased

## 2023-01-02 ENCOUNTER — Ambulatory Visit: Payer: 59 | Admitting: Podiatry

## 2023-01-11 ENCOUNTER — Ambulatory Visit: Payer: 59 | Admitting: Internal Medicine

## 2023-06-24 IMAGING — US US ABDOMEN LIMITED
1 series · 15 of 25 positions shown · non-contrast
Comparison: February 17, 2021

CLINICAL DATA: Hepatic cirrhosis.

EXAM:
ULTRASOUND ABDOMEN LIMITED RIGHT UPPER QUADRANT

[Series 1: us abdomen limited ruq mc & wl · 15 of 47 slices shown]
[im 1/47]
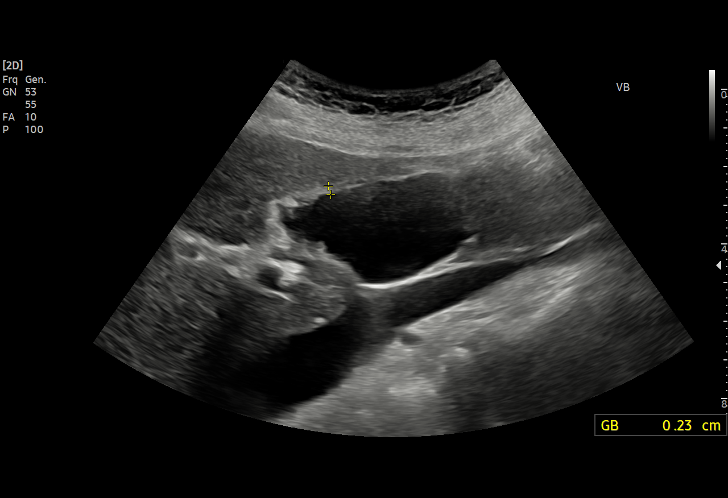
[im 4/47]
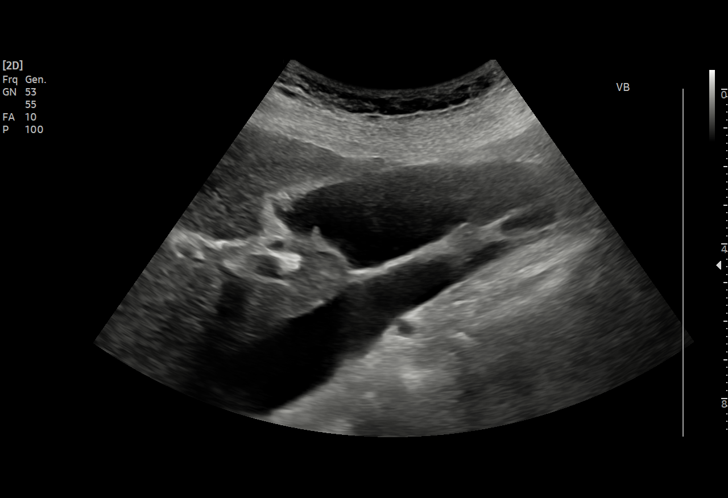
[im 8/47]
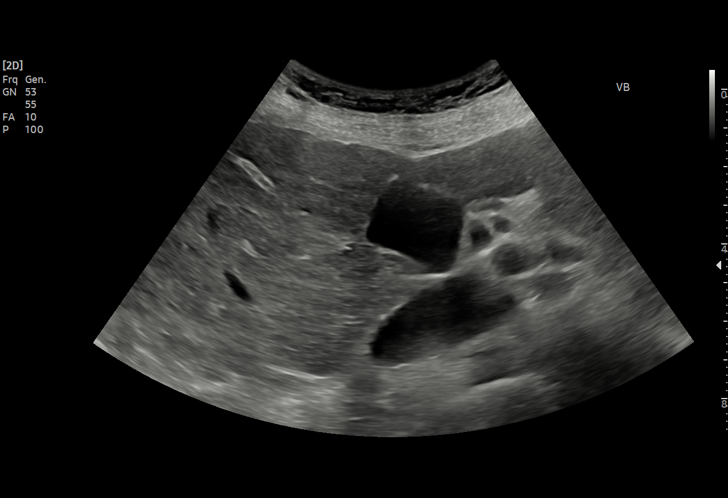
[im 10/47]
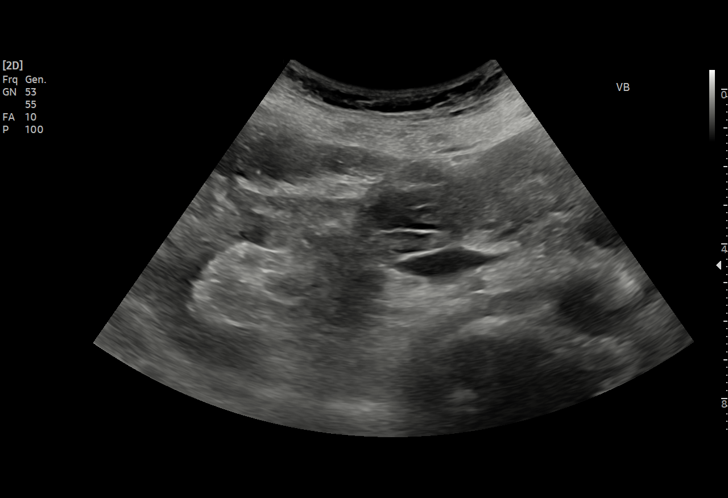
[im 14/47]
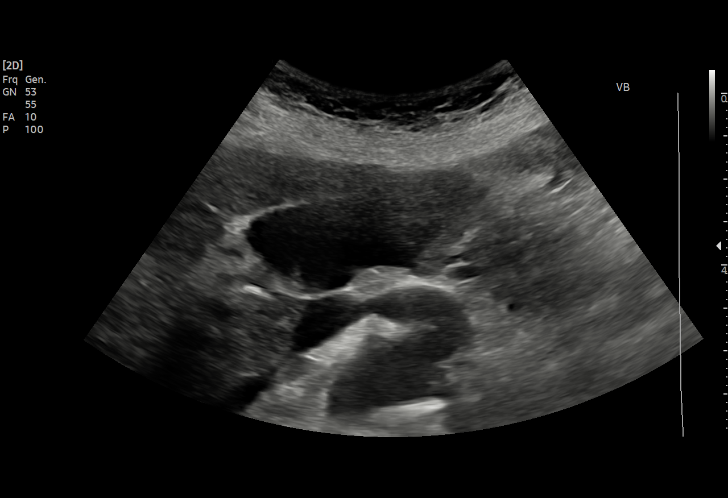
[im 18/47]
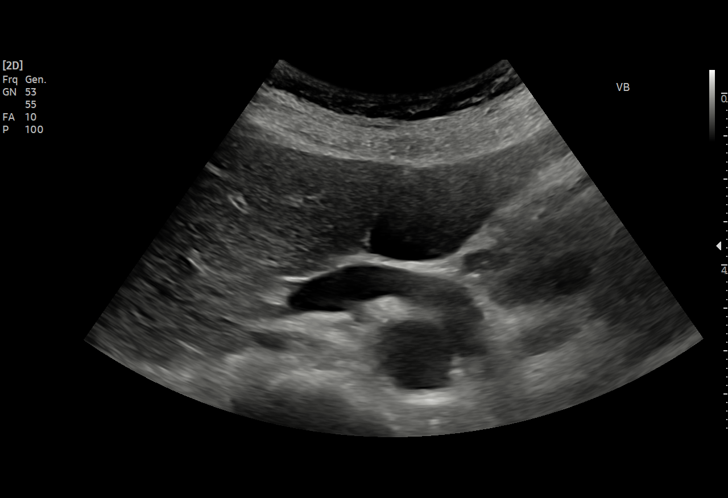
[im 20/47]
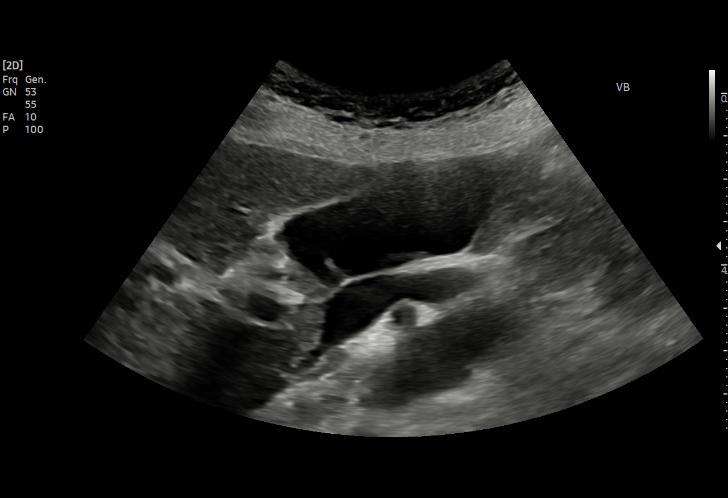
[im 24/47]
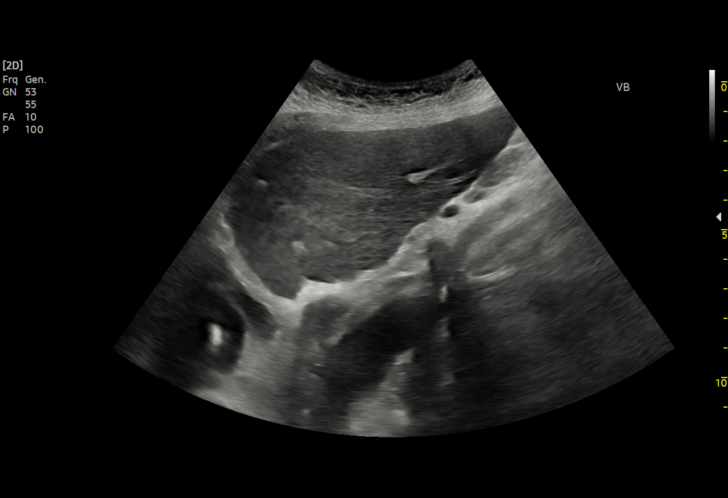
[im 27/47]
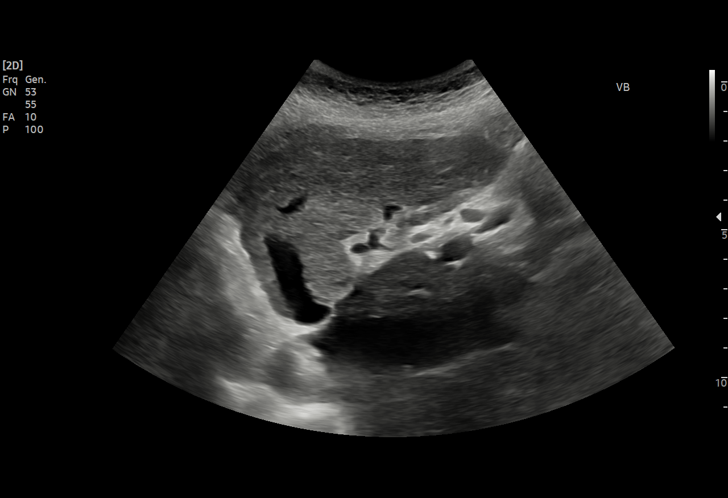
[im 29/47]
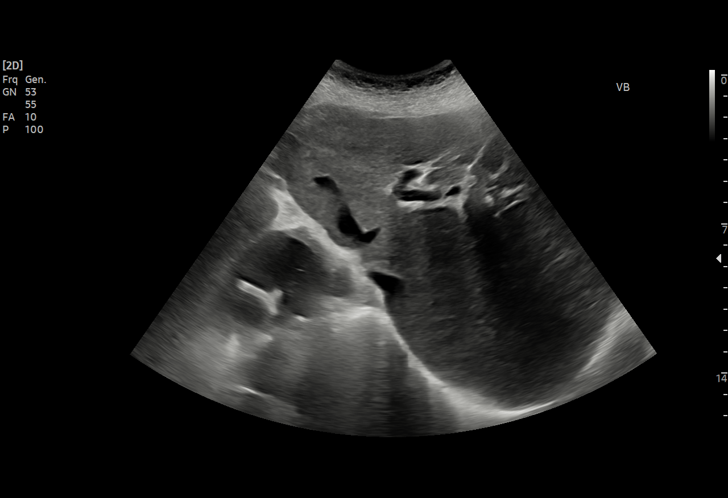
[im 33/47]
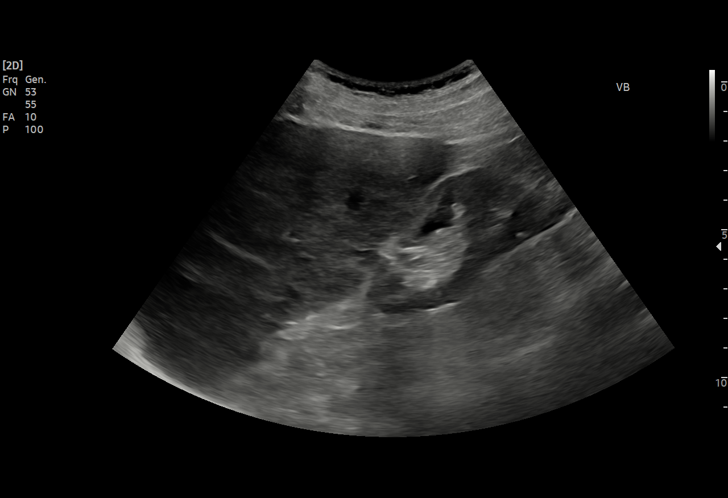
[im 37/47]
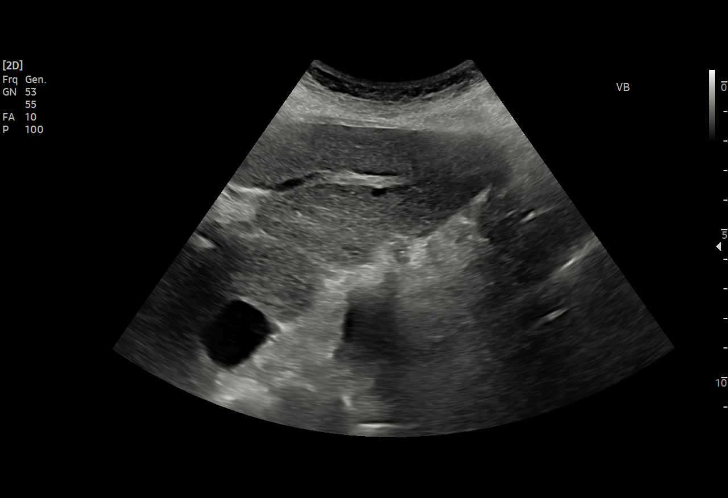
[im 39/47]
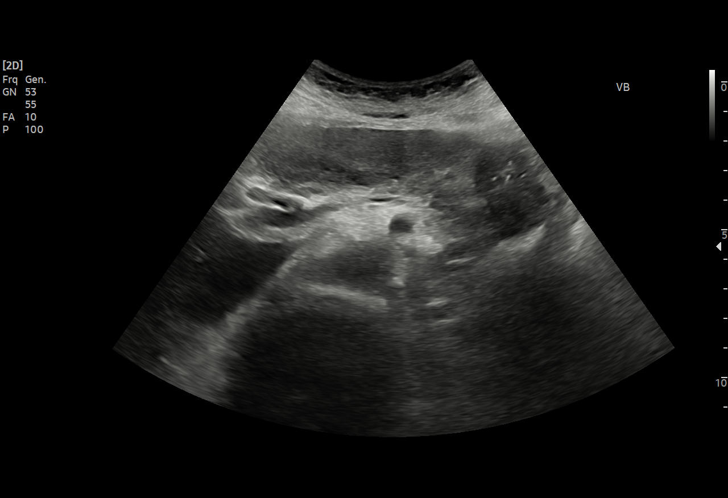
[im 43/47]
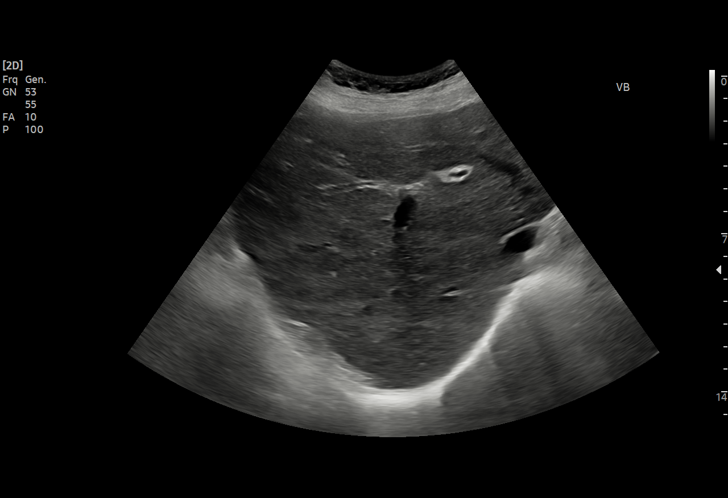
[im 47/47]
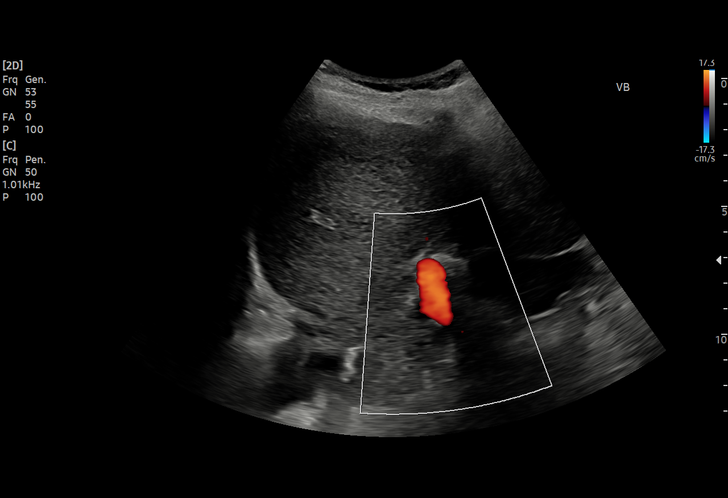

[15 of 25 positions shown; findings below may reference images not displayed]

FINDINGS: Gallbladder:

No gallstones or wall thickening visualized (2.3 mm). No sonographic
Murphy sign noted by sonographer.

Common bile duct:

Diameter: 0.5 mm

Liver:

No focal lesion identified. The liver parenchyma is nodular in
echotexture. Portal vein is patent on color Doppler imaging with
normal direction of blood flow towards the liver.

Other: None.
IMPRESSION: Findings consistent with hepatic cirrhosis without focal liver
lesions.
